# Patient Record
Sex: Female | Born: 1985 | Race: Black or African American | Hispanic: No | Marital: Single | Smoking: Never smoker
Health system: Southern US, Community
[De-identification: ages and names within clinical notes are randomized; demographics above are authoritative.]

## PROBLEM LIST (undated history)

## (undated) ENCOUNTER — Emergency Department (HOSPITAL_COMMUNITY): Admission: EM | Payer: Medicare Other

## (undated) ENCOUNTER — Emergency Department (HOSPITAL_COMMUNITY): Admission: EM | Disposition: A | Discharge status: Other Acute Inpatient Hospital | Payer: Medicare Other

## (undated) DIAGNOSIS — R9431 Abnormal electrocardiogram [ECG] [EKG]: Secondary | ICD-10-CM

## (undated) DIAGNOSIS — E119 Type 2 diabetes mellitus without complications: Secondary | ICD-10-CM

## (undated) DIAGNOSIS — D649 Anemia, unspecified: Secondary | ICD-10-CM

## (undated) DIAGNOSIS — R112 Nausea with vomiting, unspecified: Secondary | ICD-10-CM

## (undated) DIAGNOSIS — N12 Tubulo-interstitial nephritis, not specified as acute or chronic: Secondary | ICD-10-CM

## (undated) DIAGNOSIS — K219 Gastro-esophageal reflux disease without esophagitis: Secondary | ICD-10-CM

## (undated) DIAGNOSIS — K222 Esophageal obstruction: Secondary | ICD-10-CM

## (undated) DIAGNOSIS — F445 Conversion disorder with seizures or convulsions: Secondary | ICD-10-CM

## (undated) DIAGNOSIS — N289 Disorder of kidney and ureter, unspecified: Secondary | ICD-10-CM

## (undated) DIAGNOSIS — I10 Essential (primary) hypertension: Secondary | ICD-10-CM

## (undated) DIAGNOSIS — Q998 Other specified chromosome abnormalities: Secondary | ICD-10-CM

## (undated) DIAGNOSIS — Z5189 Encounter for other specified aftercare: Secondary | ICD-10-CM

## (undated) DIAGNOSIS — N189 Chronic kidney disease, unspecified: Secondary | ICD-10-CM

## (undated) DIAGNOSIS — N390 Urinary tract infection, site not specified: Secondary | ICD-10-CM

## (undated) DIAGNOSIS — R569 Unspecified convulsions: Secondary | ICD-10-CM

## (undated) DIAGNOSIS — W44F3XA Food entering into or through a natural orifice, initial encounter: Secondary | ICD-10-CM

## (undated) DIAGNOSIS — F8189 Other developmental disorders of scholastic skills: Secondary | ICD-10-CM

## (undated) DIAGNOSIS — E785 Hyperlipidemia, unspecified: Secondary | ICD-10-CM

## (undated) DIAGNOSIS — Z940 Kidney transplant status: Secondary | ICD-10-CM

## (undated) DIAGNOSIS — J302 Other seasonal allergic rhinitis: Secondary | ICD-10-CM

## (undated) DIAGNOSIS — T18128A Food in esophagus causing other injury, initial encounter: Secondary | ICD-10-CM

## (undated) HISTORY — PX: KIDNEY TRANSPLANT: SHX239

## (undated) HISTORY — DX: Encounter for other specified aftercare: Z51.89

## (undated) HISTORY — DX: Tubulo-interstitial nephritis, not specified as acute or chronic: N12

## (undated) HISTORY — DX: Other specified chromosome abnormalities: Q99.8

## (undated) HISTORY — DX: Hyperlipidemia, unspecified: E78.5

## (undated) HISTORY — DX: Chronic kidney disease, unspecified: N18.9

## (undated) HISTORY — DX: Food entering into or through a natural orifice, initial encounter: W44.F3XA

## (undated) HISTORY — DX: Essential (primary) hypertension: I10

## (undated) HISTORY — DX: Conversion disorder with seizures or convulsions: F44.5

## (undated) HISTORY — PX: ARTERIOVENOUS GRAFT PLACEMENT: SUR1029

## (undated) HISTORY — PX: PARATHYROIDECTOMY: SHX19

## (undated) HISTORY — DX: Abnormal electrocardiogram (ECG) (EKG): R94.31

## (undated) HISTORY — DX: Food in esophagus causing other injury, initial encounter: T18.128A

## (undated) HISTORY — DX: Food in esophagus causing other injury, initial encounter: K22.2

## (undated) HISTORY — DX: Other developmental disorders of scholastic skills: F81.89

---
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
## 2003-02-15 ENCOUNTER — Emergency Department (HOSPITAL_COMMUNITY): Admission: EM | Admit: 2003-02-15 | Discharge: 2003-02-15 | Payer: Self-pay | Admitting: Emergency Medicine

## 2004-10-24 ENCOUNTER — Ambulatory Visit (HOSPITAL_COMMUNITY): Admission: RE | Admit: 2004-10-24 | Discharge: 2004-10-24 | Payer: Self-pay | Admitting: *Deleted

## 2004-12-21 ENCOUNTER — Ambulatory Visit (HOSPITAL_COMMUNITY): Admission: RE | Admit: 2004-12-21 | Discharge: 2004-12-21 | Payer: Self-pay | Admitting: Vascular Surgery

## 2005-03-15 DIAGNOSIS — Z940 Kidney transplant status: Secondary | ICD-10-CM

## 2005-03-15 HISTORY — PX: KIDNEY TRANSPLANT: SHX239

## 2005-03-15 HISTORY — DX: Kidney transplant status: Z94.0

## 2005-05-11 ENCOUNTER — Ambulatory Visit: Payer: Self-pay | Admitting: Family Medicine

## 2005-05-26 ENCOUNTER — Ambulatory Visit (HOSPITAL_COMMUNITY): Admission: RE | Admit: 2005-05-26 | Discharge: 2005-05-26 | Payer: Self-pay | Admitting: Vascular Surgery

## 2005-12-13 ENCOUNTER — Ambulatory Visit: Payer: Self-pay | Admitting: Family Medicine

## 2006-05-13 ENCOUNTER — Encounter (INDEPENDENT_AMBULATORY_CARE_PROVIDER_SITE_OTHER): Payer: Self-pay | Admitting: *Deleted

## 2006-05-16 ENCOUNTER — Encounter: Admission: RE | Admit: 2006-05-16 | Discharge: 2006-05-16 | Payer: Self-pay | Admitting: Nephrology

## 2006-10-10 ENCOUNTER — Emergency Department (HOSPITAL_COMMUNITY): Admission: EM | Admit: 2006-10-10 | Discharge: 2006-10-10 | Payer: Self-pay | Admitting: Emergency Medicine

## 2006-11-23 ENCOUNTER — Encounter: Admission: RE | Admit: 2006-11-23 | Discharge: 2006-11-23 | Payer: Self-pay | Admitting: Internal Medicine

## 2006-12-12 ENCOUNTER — Ambulatory Visit: Payer: Self-pay | Admitting: Family Medicine

## 2007-02-13 ENCOUNTER — Encounter: Payer: Self-pay | Admitting: *Deleted

## 2007-08-16 ENCOUNTER — Encounter: Payer: Self-pay | Admitting: Family Medicine

## 2007-09-25 ENCOUNTER — Encounter: Payer: Self-pay | Admitting: Family Medicine

## 2007-09-25 ENCOUNTER — Ambulatory Visit: Payer: Self-pay | Admitting: Sports Medicine

## 2007-09-25 DIAGNOSIS — E669 Obesity, unspecified: Secondary | ICD-10-CM | POA: Insufficient documentation

## 2007-09-25 DIAGNOSIS — F8189 Other developmental disorders of scholastic skills: Secondary | ICD-10-CM

## 2007-09-25 DIAGNOSIS — Z940 Kidney transplant status: Secondary | ICD-10-CM | POA: Insufficient documentation

## 2007-09-25 DIAGNOSIS — I10 Essential (primary) hypertension: Secondary | ICD-10-CM | POA: Insufficient documentation

## 2007-09-25 DIAGNOSIS — D631 Anemia in chronic kidney disease: Secondary | ICD-10-CM | POA: Insufficient documentation

## 2007-09-25 DIAGNOSIS — N189 Chronic kidney disease, unspecified: Secondary | ICD-10-CM | POA: Insufficient documentation

## 2007-09-25 HISTORY — DX: Other developmental disorders of scholastic skills: F81.89

## 2007-09-25 LAB — CONVERTED CEMR LAB
Albumin: 4.4 g/dL (ref 3.5–5.2)
BUN: 36 mg/dL — ABNORMAL HIGH (ref 6–23)
CO2: 19 meq/L (ref 19–32)
Calcium: 9.1 mg/dL (ref 8.4–10.5)
Chlamydia, DNA Probe: NEGATIVE
Chloride: 107 meq/L (ref 96–112)
Cholesterol: 143 mg/dL (ref 0–200)
Creatinine, Ser: 1.99 mg/dL — ABNORMAL HIGH (ref 0.40–1.20)
Glucose, Bld: 185 mg/dL — ABNORMAL HIGH (ref 70–99)
HDL: 26 mg/dL — ABNORMAL LOW (ref >39)
Pap Smear: NORMAL
Total CHOL/HDL Ratio: 5.5
Triglycerides: 346 mg/dL — ABNORMAL HIGH (ref <150)

## 2007-10-18 ENCOUNTER — Encounter (HOSPITAL_COMMUNITY): Admission: RE | Admit: 2007-10-18 | Discharge: 2008-01-16 | Payer: Self-pay | Admitting: Nephrology

## 2007-10-26 ENCOUNTER — Inpatient Hospital Stay (HOSPITAL_COMMUNITY): Admission: EM | Admit: 2007-10-26 | Discharge: 2007-10-29 | Payer: Self-pay | Admitting: Emergency Medicine

## 2007-10-26 ENCOUNTER — Ambulatory Visit: Payer: Self-pay | Admitting: Family Medicine

## 2007-10-26 ENCOUNTER — Encounter: Payer: Self-pay | Admitting: Sports Medicine

## 2007-11-01 ENCOUNTER — Ambulatory Visit: Payer: Self-pay | Admitting: Family Medicine

## 2007-11-01 LAB — CONVERTED CEMR LAB: Hgb A1c MFr Bld: 11.3 %

## 2007-11-02 ENCOUNTER — Telehealth: Payer: Self-pay | Admitting: Family Medicine

## 2007-11-03 ENCOUNTER — Encounter: Payer: Self-pay | Admitting: Family Medicine

## 2007-11-15 ENCOUNTER — Encounter: Payer: Self-pay | Admitting: Family Medicine

## 2007-11-22 ENCOUNTER — Ambulatory Visit: Payer: Self-pay | Admitting: Family Medicine

## 2007-11-22 ENCOUNTER — Encounter: Payer: Self-pay | Admitting: Family Medicine

## 2007-11-23 ENCOUNTER — Telehealth: Payer: Self-pay | Admitting: Family Medicine

## 2007-11-30 ENCOUNTER — Encounter: Payer: Self-pay | Admitting: Family Medicine

## 2007-12-04 ENCOUNTER — Ambulatory Visit: Payer: Self-pay | Admitting: Family Medicine

## 2007-12-05 ENCOUNTER — Ambulatory Visit: Payer: Self-pay | Admitting: Family Medicine

## 2007-12-05 DIAGNOSIS — N2581 Secondary hyperparathyroidism of renal origin: Secondary | ICD-10-CM | POA: Insufficient documentation

## 2007-12-07 ENCOUNTER — Encounter: Payer: Self-pay | Admitting: Family Medicine

## 2007-12-08 ENCOUNTER — Encounter: Admission: RE | Admit: 2007-12-08 | Discharge: 2007-12-08 | Payer: Self-pay | Admitting: Internal Medicine

## 2007-12-15 ENCOUNTER — Encounter: Payer: Self-pay | Admitting: Family Medicine

## 2007-12-20 ENCOUNTER — Other Ambulatory Visit: Payer: Self-pay

## 2008-01-09 ENCOUNTER — Ambulatory Visit: Payer: Self-pay | Admitting: Family Medicine

## 2008-01-10 ENCOUNTER — Encounter: Payer: Self-pay | Admitting: Family Medicine

## 2008-01-15 ENCOUNTER — Telehealth: Payer: Self-pay | Admitting: *Deleted

## 2008-01-18 ENCOUNTER — Encounter (HOSPITAL_COMMUNITY): Admission: RE | Admit: 2008-01-18 | Discharge: 2008-03-14 | Payer: Self-pay | Admitting: Nephrology

## 2008-01-24 ENCOUNTER — Ambulatory Visit: Payer: Self-pay | Admitting: Family Medicine

## 2008-01-24 LAB — CONVERTED CEMR LAB: Hgb A1c MFr Bld: 9.6 %

## 2008-02-12 ENCOUNTER — Telehealth: Payer: Self-pay | Admitting: *Deleted

## 2008-02-16 ENCOUNTER — Ambulatory Visit: Payer: Self-pay | Admitting: Family Medicine

## 2008-02-22 ENCOUNTER — Telehealth: Payer: Self-pay | Admitting: *Deleted

## 2008-03-06 ENCOUNTER — Telehealth: Payer: Self-pay | Admitting: *Deleted

## 2008-03-15 ENCOUNTER — Encounter (HOSPITAL_COMMUNITY): Admission: RE | Admit: 2008-03-15 | Discharge: 2008-06-13 | Payer: Self-pay | Admitting: Nephrology

## 2008-03-29 ENCOUNTER — Telehealth: Payer: Self-pay | Admitting: *Deleted

## 2008-04-09 ENCOUNTER — Ambulatory Visit: Payer: Self-pay | Admitting: Family Medicine

## 2008-04-19 ENCOUNTER — Telehealth (INDEPENDENT_AMBULATORY_CARE_PROVIDER_SITE_OTHER): Payer: Self-pay | Admitting: *Deleted

## 2008-04-23 ENCOUNTER — Encounter: Payer: Self-pay | Admitting: Family Medicine

## 2008-04-25 ENCOUNTER — Telehealth (INDEPENDENT_AMBULATORY_CARE_PROVIDER_SITE_OTHER): Payer: Self-pay | Admitting: *Deleted

## 2008-05-02 ENCOUNTER — Ambulatory Visit: Payer: Self-pay | Admitting: Family Medicine

## 2008-05-09 ENCOUNTER — Encounter: Payer: Self-pay | Admitting: Family Medicine

## 2008-05-09 ENCOUNTER — Ambulatory Visit: Payer: Self-pay | Admitting: Family Medicine

## 2008-05-09 LAB — CONVERTED CEMR LAB: Hgb A1c MFr Bld: 7.9 %

## 2008-05-10 ENCOUNTER — Encounter: Payer: Self-pay | Admitting: Family Medicine

## 2008-05-10 LAB — CONVERTED CEMR LAB: Chlamydia, DNA Probe: NEGATIVE

## 2008-05-29 ENCOUNTER — Ambulatory Visit (HOSPITAL_COMMUNITY): Admission: RE | Admit: 2008-05-29 | Discharge: 2008-05-29 | Payer: Self-pay | Admitting: Nephrology

## 2008-06-14 ENCOUNTER — Encounter (HOSPITAL_COMMUNITY): Admission: RE | Admit: 2008-06-14 | Discharge: 2008-09-12 | Payer: Self-pay | Admitting: Nephrology

## 2008-06-20 ENCOUNTER — Encounter: Payer: Self-pay | Admitting: Family Medicine

## 2008-06-21 ENCOUNTER — Ambulatory Visit: Payer: Self-pay | Admitting: Family Medicine

## 2008-06-24 ENCOUNTER — Telehealth: Payer: Self-pay | Admitting: Family Medicine

## 2008-06-26 ENCOUNTER — Telehealth: Payer: Self-pay | Admitting: *Deleted

## 2008-07-18 ENCOUNTER — Telehealth: Payer: Self-pay | Admitting: Family Medicine

## 2008-07-30 ENCOUNTER — Encounter: Payer: Self-pay | Admitting: Family Medicine

## 2008-08-02 ENCOUNTER — Ambulatory Visit: Payer: Self-pay | Admitting: Family Medicine

## 2008-08-02 LAB — CONVERTED CEMR LAB: Hgb A1c MFr Bld: 8.6 %

## 2008-09-24 ENCOUNTER — Encounter (HOSPITAL_COMMUNITY): Admission: RE | Admit: 2008-09-24 | Discharge: 2008-12-23 | Payer: Self-pay | Admitting: Nephrology

## 2008-10-15 ENCOUNTER — Telehealth: Payer: Self-pay | Admitting: Family Medicine

## 2008-10-22 ENCOUNTER — Telehealth: Payer: Self-pay | Admitting: Family Medicine

## 2008-10-24 ENCOUNTER — Encounter: Payer: Self-pay | Admitting: Family Medicine

## 2008-10-24 LAB — CONVERTED CEMR LAB
AG Ratio: 1.6
ALT: 9 units/L
AST: 12 units/L
Albumin: 4.5 g/dL
Alkaline Phosphatase: 60 units/L
CO2, blood: 25 mmol/L
Chloride, Serum: 99 mmol/L
Creatinine,U: 53.1 mg/dL
Ferritin: 857 ng/mL
Globulin: 2.8 g/dL
HCT: 33.2 %
Hemoglobin: 11 g/dL
Iron: 68 ug/dL
Ketones, urine, test strip: NEGATIVE
LDL Cholesterol: 85 mg/dL
MCV: 90 fL
Potassium, serum: 4.5 mmol/L
Protein, ur: 2.6 mg/dL
Saturation Ratios: 27 %
Sodium, serum: 132 mmol/L
Triglyceride fasting, serum: 238 mg/dL
WBC: 9.7 10*3/uL
pH: 5.5

## 2008-10-28 ENCOUNTER — Encounter: Payer: Self-pay | Admitting: Family Medicine

## 2008-11-19 ENCOUNTER — Ambulatory Visit: Payer: Self-pay | Admitting: Family Medicine

## 2008-11-19 ENCOUNTER — Encounter: Payer: Self-pay | Admitting: Family Medicine

## 2008-11-19 DIAGNOSIS — Q998 Other specified chromosome abnormalities: Secondary | ICD-10-CM

## 2008-11-19 DIAGNOSIS — Q970 Karyotype 47, XXX: Secondary | ICD-10-CM | POA: Insufficient documentation

## 2008-11-19 HISTORY — DX: Other specified chromosome abnormalities: Q99.8

## 2008-11-19 LAB — CONVERTED CEMR LAB: Hgb A1c MFr Bld: 9.9 %

## 2008-12-25 ENCOUNTER — Encounter: Payer: Self-pay | Admitting: Family Medicine

## 2009-01-02 ENCOUNTER — Encounter (HOSPITAL_COMMUNITY): Admission: RE | Admit: 2009-01-02 | Discharge: 2009-04-02 | Payer: Self-pay | Admitting: Nephrology

## 2009-01-22 ENCOUNTER — Ambulatory Visit: Payer: Self-pay | Admitting: Family Medicine

## 2009-02-20 ENCOUNTER — Encounter: Payer: Self-pay | Admitting: Family Medicine

## 2009-03-11 ENCOUNTER — Ambulatory Visit: Payer: Self-pay | Admitting: Family Medicine

## 2009-03-11 ENCOUNTER — Encounter: Payer: Self-pay | Admitting: Family Medicine

## 2009-03-11 DIAGNOSIS — E28319 Asymptomatic premature menopause: Secondary | ICD-10-CM | POA: Insufficient documentation

## 2009-03-11 LAB — CONVERTED CEMR LAB: Hgb A1c MFr Bld: 8.2 %

## 2009-03-12 LAB — CONVERTED CEMR LAB: FSH: 110.7 milliintl units/mL

## 2009-03-26 ENCOUNTER — Encounter: Payer: Self-pay | Admitting: Family Medicine

## 2009-04-04 ENCOUNTER — Encounter (HOSPITAL_COMMUNITY): Admission: RE | Admit: 2009-04-04 | Discharge: 2009-07-03 | Payer: Self-pay | Admitting: Nephrology

## 2009-05-30 ENCOUNTER — Ambulatory Visit: Payer: Self-pay | Admitting: Family Medicine

## 2009-05-30 LAB — CONVERTED CEMR LAB: Hgb A1c MFr Bld: 9 %

## 2009-06-03 ENCOUNTER — Encounter: Payer: Self-pay | Admitting: Family Medicine

## 2009-06-04 ENCOUNTER — Ambulatory Visit: Payer: Self-pay | Admitting: Family Medicine

## 2009-06-04 ENCOUNTER — Encounter: Payer: Self-pay | Admitting: Family Medicine

## 2009-06-05 ENCOUNTER — Encounter: Payer: Self-pay | Admitting: Family Medicine

## 2009-06-12 ENCOUNTER — Encounter: Payer: Self-pay | Admitting: Family Medicine

## 2009-06-12 ENCOUNTER — Ambulatory Visit: Payer: Self-pay | Admitting: Family Medicine

## 2009-06-25 ENCOUNTER — Ambulatory Visit: Payer: Self-pay | Admitting: Family Medicine

## 2009-06-25 ENCOUNTER — Telehealth: Payer: Self-pay | Admitting: Family Medicine

## 2009-06-25 ENCOUNTER — Telehealth: Payer: Self-pay | Admitting: Sports Medicine

## 2009-06-26 ENCOUNTER — Telehealth: Payer: Self-pay | Admitting: Family Medicine

## 2009-07-02 ENCOUNTER — Encounter: Payer: Self-pay | Admitting: Family Medicine

## 2009-07-02 LAB — CONVERTED CEMR LAB
Basophils Relative: 0 %
Bilirubin Urine: NEGATIVE
CO2, blood: 25 mmol/L
Calcium: 8.4 mg/dL
Chloride, Serum: 100 mmol/L
Creatinine, Ser: 1.81 mg/dL
Glucose, Bld: 384 mg/dL
Glucose, Urine, Semiquant: NEGATIVE
Lymphocytes Relative: 19 %
MCV: 92 fL
Monocytes Relative: 4 %
RBC / HPF: NONE SEEN
RBC, Urine, dipstick: NEGATIVE
RDW: 13.5 %
Saturation Ratios: 27 %

## 2009-07-08 ENCOUNTER — Encounter: Payer: Self-pay | Admitting: Family Medicine

## 2009-07-16 ENCOUNTER — Encounter: Payer: Self-pay | Admitting: Family Medicine

## 2009-07-16 ENCOUNTER — Encounter (HOSPITAL_COMMUNITY): Admission: RE | Admit: 2009-07-16 | Discharge: 2009-10-14 | Payer: Self-pay | Admitting: Nephrology

## 2009-07-17 ENCOUNTER — Encounter: Payer: Self-pay | Admitting: Family Medicine

## 2009-07-17 LAB — CONVERTED CEMR LAB
Albumin: 4.7 g/dL
Basophils Relative: 0 %
Bilirubin Urine: NEGATIVE
Creatinine, Ser: 1.35 mg/dL
Eosinophil percent: 0.1 %
Hemoglobin: 10.8 g/dL
Ketones, urine, test strip: NEGATIVE
Neutrophils Relative %: 6.2 %
Phosphorus: 2.6 mg/dL
Platelets: 206 10*3/uL
Potassium, serum: 4.5 mmol/L
RBC count: 3.64 10*6/uL
Sodium, serum: 136 mmol/L
TIBC: 238 ug/dL
WBC, blood: 8.7 10*3/uL
pH: 5.5

## 2009-07-23 ENCOUNTER — Encounter: Payer: Self-pay | Admitting: Family Medicine

## 2009-08-01 ENCOUNTER — Ambulatory Visit: Payer: Self-pay | Admitting: Family Medicine

## 2009-08-04 ENCOUNTER — Telehealth: Payer: Self-pay | Admitting: Family Medicine

## 2009-08-26 ENCOUNTER — Encounter: Payer: Self-pay | Admitting: Family Medicine

## 2009-09-02 ENCOUNTER — Ambulatory Visit: Payer: Self-pay | Admitting: Family Medicine

## 2009-09-16 ENCOUNTER — Encounter: Payer: Self-pay | Admitting: Family Medicine

## 2009-10-23 ENCOUNTER — Encounter (HOSPITAL_COMMUNITY)
Admission: RE | Admit: 2009-10-23 | Discharge: 2010-01-21 | Payer: Self-pay | Source: Home / Self Care | Admitting: Nephrology

## 2009-11-12 ENCOUNTER — Ambulatory Visit: Payer: Self-pay | Admitting: Family Medicine

## 2009-11-19 ENCOUNTER — Encounter: Payer: Self-pay | Admitting: Family Medicine

## 2009-12-12 ENCOUNTER — Ambulatory Visit: Payer: Self-pay | Admitting: Family Medicine

## 2009-12-12 ENCOUNTER — Encounter: Payer: Self-pay | Admitting: Family Medicine

## 2009-12-12 LAB — CONVERTED CEMR LAB: Hgb A1c MFr Bld: 10.3 %

## 2010-01-04 ENCOUNTER — Encounter: Payer: Self-pay | Admitting: Family Medicine

## 2010-01-04 DIAGNOSIS — N182 Chronic kidney disease, stage 2 (mild): Secondary | ICD-10-CM | POA: Insufficient documentation

## 2010-01-04 DIAGNOSIS — N189 Chronic kidney disease, unspecified: Secondary | ICD-10-CM | POA: Insufficient documentation

## 2010-01-19 ENCOUNTER — Encounter: Payer: Self-pay | Admitting: Family Medicine

## 2010-01-26 ENCOUNTER — Telehealth: Payer: Self-pay | Admitting: *Deleted

## 2010-01-26 ENCOUNTER — Ambulatory Visit: Payer: Self-pay | Admitting: Family Medicine

## 2010-01-26 LAB — CONVERTED CEMR LAB
Blood in Urine, dipstick: NEGATIVE
Glucose, Urine, Semiquant: 100
Specific Gravity, Urine: >=1.03
WBC Urine, dipstick: NEGATIVE
pH: 5.5

## 2010-01-28 ENCOUNTER — Telehealth: Payer: Self-pay | Admitting: Family Medicine

## 2010-01-29 ENCOUNTER — Ambulatory Visit: Payer: Self-pay | Admitting: Family Medicine

## 2010-01-29 ENCOUNTER — Encounter: Payer: Self-pay | Admitting: Family Medicine

## 2010-01-29 LAB — CONVERTED CEMR LAB
Calcium: 8.6 mg/dL (ref 8.4–10.5)
Creatinine, Ser: 1.51 mg/dL — ABNORMAL HIGH (ref 0.40–1.20)
Hemoglobin: 10.3 g/dL — ABNORMAL LOW (ref 12.0–15.0)
RBC: 3.56 M/uL — ABNORMAL LOW (ref 3.87–5.11)
WBC: 8.5 10*3/uL (ref 4.0–10.5)

## 2010-01-30 ENCOUNTER — Encounter (HOSPITAL_COMMUNITY)
Admission: RE | Admit: 2010-01-30 | Discharge: 2010-04-14 | Payer: Self-pay | Source: Home / Self Care | Attending: Nephrology | Admitting: Nephrology

## 2010-02-02 ENCOUNTER — Telehealth (INDEPENDENT_AMBULATORY_CARE_PROVIDER_SITE_OTHER): Payer: Self-pay | Admitting: *Deleted

## 2010-02-11 ENCOUNTER — Ambulatory Visit: Payer: Self-pay | Admitting: Family Medicine

## 2010-02-24 ENCOUNTER — Ambulatory Visit: Payer: Self-pay | Admitting: Family Medicine

## 2010-03-10 ENCOUNTER — Encounter: Payer: Self-pay | Admitting: Family Medicine

## 2010-03-17 ENCOUNTER — Encounter: Payer: Self-pay | Admitting: *Deleted

## 2010-03-30 LAB — POCT HEMOGLOBIN-HEMACUE: Hemoglobin: 10.7 g/dL — ABNORMAL LOW (ref 12.0–15.0)

## 2010-03-31 ENCOUNTER — Ambulatory Visit: Admit: 2010-03-31 | Payer: Self-pay

## 2010-04-03 ENCOUNTER — Ambulatory Visit: Admission: RE | Admit: 2010-04-03 | Discharge: 2010-04-03 | Payer: Self-pay | Source: Home / Self Care

## 2010-04-03 ENCOUNTER — Encounter: Payer: Self-pay | Admitting: Family Medicine

## 2010-04-03 LAB — CONVERTED CEMR LAB
Calcium: 10 mg/dL (ref 8.4–10.5)
Chloride: 100 meq/L (ref 96–112)
Creatinine, Ser: 1.56 mg/dL — ABNORMAL HIGH (ref 0.40–1.20)

## 2010-04-06 ENCOUNTER — Encounter: Payer: Self-pay | Admitting: Internal Medicine

## 2010-04-06 ENCOUNTER — Telehealth: Payer: Self-pay | Admitting: Family Medicine

## 2010-04-08 ENCOUNTER — Ambulatory Visit: Admission: RE | Admit: 2010-04-08 | Discharge: 2010-04-08 | Payer: Self-pay | Source: Home / Self Care

## 2010-04-08 ENCOUNTER — Encounter: Payer: Self-pay | Admitting: Family Medicine

## 2010-04-09 ENCOUNTER — Telehealth: Payer: Self-pay | Admitting: *Deleted

## 2010-04-14 NOTE — Assessment & Plan Note (Signed)
Summary: pink eye per pt/[place]   Vital Signs:  Patient profile:   25 year old female Height:      66.25 inches Weight:      209 pounds BMI:     33.60 BSA:     2.05 Temp:     98.6 degrees F Pulse rate:   88 / minute BP sitting:   153 / 106  Vitals Entered By: Christen Bame CMA (June 25, 2009 11:47 AM) CC: Pink eye? Is Patient Diabetic? Yes Did you bring your meter with you today? No Pain Assessment Patient in pain? no       Vision Screening:Left eye w/o correction: 20 / 25 Right Eye w/o correction: 20 / 25 Both eyes w/o correction:  20/ 20        Vision Entered By: Levert Feinstein LPN (April 13, 624THL 624THL PM)   Primary Care Provider:  Patria Mane  MD  CC:  Pink eye?Marland Kitchen  History of Present Illness: ?pink eye: evening prior to visit woke up from nap with painful, red left eye. looked on the internet and tried some various "home remedies" but without relief so called to be seen today. she has noticed that the eye is tearing a great deal, is somewhat sensitive to light, is red and tender to touch.  she has been using [employer] brand eye drops without much relief.  no fevers.  no one else at home has similar symptoms.  she does feel like there is a foreign body in her eye but she doesn't recall having one get into her eye.   Habits & Providers  Alcohol-Tobacco-Diet     Tobacco Status: never  Current Medications (verified): 1)  Myfortic 360 Mg  Tbec (Mycophenolate Sodium) .... 3 Tablets Two Times A Day 2)  Prograf 1 Mg Caps (Tacrolimus) .... 4 Caps By Mouth Two Times A Day 3)  Tums 500 Mg  Chew (Calcium Carbonate Antacid) .... Prn 4)  Procrit 10000 Unit/ml  Soln (Epoetin Alfa) .... 30,000 Units Q Month 5)  Glucotrol Xl 10 Mg Xr24h-Tab (Glipizide) .Marland Kitchen.. 1 Tab By Mouth Bid 6)  Lantus 100 Unit/ml Soln (Insulin Glargine) .... 50 Units Subcutaneously At Bedtime. Suppply Quant Sufficient For 1 Month.  Disp With Needles, Syringes 7)  Ascensia Breeze 2  Disk (Glucose Blood) .... Test  Blood Sugar Four Times Daily. 8)  Bactrim 400-80 Mg Tabs (Sulfamethoxazole-Trimethoprim) .Marland Kitchen.. 1 Tab Daily Per Kidney Doctor. 9)  Prednisone 5 Mg Tabs (Prednisone) .... Unknown Dosage Per Kidney Transplant Doctors 10)  Hydroxyzine Hcl 25 Mg Tabs (Hydroxyzine Hcl) .Marland Kitchen.. 1 By Mouth Two Times A Day As Needed Itching 11)  Ranitidine Hcl 150 Mg Tabs (Ranitidine Hcl) .Marland Kitchen.. 1 By Mouth Once Daily As Needed 12)  Clonidine Hcl 0.2 Mg Tabs (Clonidine Hcl) .Marland Kitchen.. 1 By Mouth Q6hr As Needed Sbp >165 Per [facility] 13)  B-D Ultra-Fine 30gx0.28ml .... Use As Directed 14)  Promethazine Hcl 25 Mg Tabs (Promethazine Hcl) .... 1/2 - 1 Tab By Mouth Three Times A Day As Needed Nausea 15)  Erythromycin 5 Mg/gm Oint (Erythromycin) .... Apply To Lower Eye Lid Three Times A Day For 7 Days For Pink Eye.  Disp 1 Tube  Allergies (verified): No Known Drug Allergies  Past History:  Past medical, surgical, family and social histories (including risk factors) reviewed for relevance to current acute and chronic problems.  Past Medical History: Reviewed history from 11/01/2007 and no changes required. 48, XXX, absent uterus, right ovary, small left ovary, FSGS,  h/o enuresis, s/p deceased donor kidney transplant in 4/07 from a donor with an underlying malignancy- followed in [place] procrit Glucose intolerance s/p transplant, now diabetic in 10/2007  Past Surgical History: Reviewed history from 05/12/2006 and no changes required. CT - Abdominal 10/00 -, CT - Pelvic 10/00 -, Karyotype 9/00 -, Multiple AV grafts (most recent 12/06) - 05/11/2005, Parathyroidectomy - 10/13/2004, Peritoneal abscss surgery -  Family History: Reviewed history from 01/22/2009 and no changes required. mother with HTN  Social History: Reviewed history from 09/25/2007 and no changes required. Lives with mother, attends [employer] (early childhood dev). ; No tobacco, etoh or drug use. Sexually active.  Review of Systems       per HPI.  some  associated runny nose and ear pain.  no abdominal pain.    Physical Exam  General:  obese, NAD, pleasant  VS and visual acuity noted Head:  NCAT Eyes:  under flurocesin stain no corneal abrasion noted in left eye. L eye conjunctiva very erythematous.  significant tearing of left eyevision grossly intact, pupils equal, pupils round, pupils reactive to light, pupils react to accomodation, and no optic disk abnormalities.   Nose:  mild clear rhinorrhea   Impression & Recommendations:  Problem # 1:  CONJUNCTIVITIS, ACUTE, LEFT (ICD-372.00) Assessment New  trial of emycin ointment - likely viral conjunctivitis but ointment may be soothing to eye discomfort.  call for any worsening symptoms.  examined with dr Andria Frames. Her updated medication list for this problem includes:    Erythromycin 5 Mg/gm Oint (Erythromycin) .Marland Kitchen... Apply to lower eye lid three times a day for 7 days for pink eye.  disp 1 tube  Orders: [place]- Est Level  3 ([address]213)  Complete Medication List: 1)  Myfortic 360 Mg Tbec (Mycophenolate sodium) .... 3 tablets two times a day 2)  Prograf 1 Mg Caps (Tacrolimus) .... 4 caps by mouth two times a day 3)  Tums 500 Mg Chew (Calcium carbonate antacid) .... Prn 4)  Procrit 10000 Unit/ml Soln (Epoetin alfa) .... 30,000 units q month 5)  Glucotrol Xl 10 Mg Xr24h-tab (Glipizide) .Marland Kitchen.. 1 tab by mouth bid 6)  Lantus 100 Unit/ml Soln (Insulin glargine) .... 50 units subcutaneously at bedtime. suppply quant sufficient for 1 month.  disp with needles, syringes 7)  Ascensia Breeze 2 Disk (Glucose blood) .... Test blood sugar four times daily. 8)  Bactrim 400-80 Mg Tabs (Sulfamethoxazole-trimethoprim) .Marland Kitchen.. 1 tab daily per kidney doctor. 9)  Prednisone 5 Mg Tabs (Prednisone) .... Unknown dosage per kidney transplant doctors 10)  Hydroxyzine Hcl 25 Mg Tabs (Hydroxyzine hcl) .Marland Kitchen.. 1 by mouth two times a day as needed itching 11)  Ranitidine Hcl 150 Mg Tabs (Ranitidine hcl) .Marland Kitchen.. 1 by mouth once daily  as needed 12)  Clonidine Hcl 0.2 Mg Tabs (Clonidine hcl) .Marland Kitchen.. 1 by mouth q6hr as needed sbp >165 per [facility] 13)  B-d Ultra-fine 30gx0.52ml  .... Use as directed 14)  Promethazine Hcl 25 Mg Tabs (Promethazine hcl) .... 1/2 - 1 tab by mouth three times a day as needed nausea 15)  Erythromycin 5 Mg/gm Oint (Erythromycin) .... Apply to lower eye lid three times a day for 7 days for pink eye.  disp 1 tube  Patient Instructions: 1)  Use the ointment - pull your lower eyelid down and put some across the eyelid then blink.  you will have blurry vision for a few minutes but then the eye will feel moisturized.  do this three times daily for the  next 7 days for your pink eye. 2)  Call me if things get worse and not better. Prescriptions: ERYTHROMYCIN 5 MG/GM OINT (ERYTHROMYCIN) apply to lower eye lid three times a day for 7 days for pink eye.  Disp 1 tube  #1 x 0   Entered and Authorized by:   Patria Mane  MD   Signed by:   Patria Mane  MD on 06/25/2009   Method used:   Electronically to        [facility]. [address] [phone]* (retail)       [address]       [place], [place]  [address]       Ph: [phone] or [phone]       Fax: [phone]   RxID:   [id number]

## 2010-04-14 NOTE — Miscellaneous (Signed)
  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  GFR= 76

## 2010-04-14 NOTE — Progress Notes (Signed)
Summary: pt has diarrhea would like to be seen  Phone Note Call from Patient Call back at [phone]   Caller: Patient Summary of Call: pt states  that she thinks that she has the flu, she is c/o diarrhea mostly. suggested  that she try immodium but she had a kidney transplant and was told not to try that.  wants to be seen Initial call taken by: Audelia Hives CMA,  January 26, 2010 8:52 AM  Follow-up for Phone Call        Diarrhea x 4 days.  No abd cramping or pain, no fever.  Tod her to come in this am and we would work her in. Follow-up by: Elray Mcgregor RN,  January 26, 2010 8:57 AM

## 2010-04-14 NOTE — Consult Note (Signed)
Summary: [facility]   Imported By: Audie Clear 08/05/2009 15:29:18  _____________________________________________________________________  External Attachment:    Type:   Image     Comment:   External Document

## 2010-04-14 NOTE — Assessment & Plan Note (Signed)
Summary: DM/eo   Vital Signs:  Patient profile:   25 year old female Height:      65.25 inches Weight:      195.19 pounds BMI:     32.35 BSA:     1.96 Temp:     98.4 degrees F Pulse rate:   77 / minute BP sitting:   117 / 78  Vitals Entered By: Christen Bame CMA (February 11, 2010 4:10 PM) CC: f/u DM Is Patient Diabetic? Yes Did you bring your meter with you today? No Pain Assessment Patient in pain? no        Primary Provider:  Cletus Gash MD  CC:  f/u DM.  History of Present Illness: [name] is feeling much better now- her diarrhea is resolved and she is wondering what the lab studies showed.  DM- pt says her blood sugars have been high.  200 in am and 300-400 in pm.  She still believes that the diabetes is due to being on prednisone for kidney anti-rejection.  She says it shoots up when she takes the prednisone, but it also shoots up when she eats a snack.  Denies CP, Shortness of breath. She needs her BD needles refilled as they gave her the long ones, not the short ones last time and they hurt more.   Obesity- Pt. continues to gain weight.  She says she has been exercising- walking on the treadmill about 30 min/day. She seems unwilling to make changes in her diet.   HTN- better controlled since taking lisinopril.  Pt. not complaining of any side effects.   Habits & Providers  Alcohol-Tobacco-Diet     Tobacco Status: never  Allergies: No Known Drug Allergies  Physical Exam  General:  Well-developed,well-nourished,in no acute distress; alert,appropriate and cooperative throughout examination Eyes:  No corneal or conjunctival inflammation noted. EOMI. Perrla. Funduscopic exam benign, without hemorrhages, exudates or papilledema. Vision grossly normal. Mouth:  Oral mucosa and oropharynx without lesions or exudates.  Teeth in good repair. Neck:  No deformities, masses, or tenderness noted. Lungs:  Normal respiratory effort, chest expands symmetrically.  Lungs are clear to auscultation, no crackles or wheezes. Heart:  Normal rate and regular rhythm. S1 and S2 normal without gallop, murmur, click, rub or other extra sounds. Abdomen:  Bowel sounds positive,abdomen soft and non-tender without masses, organomegaly or hernias noted. Pulses:  R and L carotid,radial,dorsalis pedis and posterior tibial pulses are full and equal bilaterally   Impression & Recommendations:  Problem # 1:  DM (ICD-250.00)  Poorly controlled.  Pt has difficulty understanding why it is important to control her blood sugar.  She blames her blood sugars on being on prednisone and therefore thinks it is not her fault.  It seems that since she does not feel it is her fault, it is not important for them to be low.  I reinforced that even though prednisone makes her blood sugars higher, it is still important to get them lower.  Reminded her of some of the complications of DM, including blindness, MI, CVA, and limb loss.  I am referring her to Pharmacy clinic for further management of her DM as her learning disability makes it difficult to find an appropriate regimen for her.  Her updated medication list for this problem includes:    Glucotrol Xl 10 Mg Xr24h-tab (Glipizide) .Marland Kitchen... 1 tab by mouth bid    Lantus 100 Unit/ml Soln (Insulin glargine) .Marland KitchenMarland KitchenMarland KitchenMarland Kitchen 60 units subcutaneously at bedtime. suppply quant sufficient for 1 month.  disp  with needles, syringes    Novolog 100 Unit/ml Soln (Insulin aspart) .Marland Kitchen... Take 5 units at bed time. disp 2 vials. disp with insulin syringes and needles for once daily dosing    Lisinopril 10 Mg Tabs (Lisinopril) .Marland Kitchen... Take one by mouth daily  Orders: [place]- Est  Level 4 [id number])  Problem # 2:  ESSENTIAL HYPERTENSION, BENIGN (ICD-401.1)  Much improved.  Will continue current medications.  Her updated medication list for this problem includes:    Clonidine Hcl 0.2 Mg Tabs (Clonidine hcl) .Marland Kitchen... 1 by mouth q6hr as needed sbp >165 per [facility]     Lisinopril 10 Mg Tabs (Lisinopril) .Marland Kitchen... Take one by mouth daily  Orders: [place]- Est  Level 4 [id number])  Problem # 3:  OBESITY (ICD-278.00)  Pt. has gained weight again, despite exercising.  Pt. not willing or has poor understanding about making diet changes. Will continue to reinforce health eating.   Orders: [place]- Est  Level 4 [id number])  Problem # 4:  LEARNING DISABILITY (ICD-315.2)  Pt's learning disablity continues to be a barrier to her optimizing her health.  Will continue to encourage her and have her mom help as well.   Orders: [place]- Est  Level 4 ([id number])  Complete Medication List: 1)  Myfortic 360 Mg Tbec (Mycophenolate sodium) .... 3 tablets two times a day 2)  Prograf 1 Mg Caps (Tacrolimus) .... 4 caps by mouth two times a day 3)  Tums 500 Mg Chew (Calcium carbonate antacid) .... Prn 4)  Procrit 10000 Unit/ml Soln (Epoetin alfa) .... 30,000 units q month 5)  Glucotrol Xl 10 Mg Xr24h-tab (Glipizide) .Marland Kitchen.. 1 tab by mouth bid 6)  Lantus 100 Unit/ml Soln (Insulin glargine) .... 60 units subcutaneously at bedtime. suppply quant sufficient for 1 month.  disp with needles, syringes 7)  Ascensia Breeze 2 Disk (Glucose blood) .... Test blood sugar four times daily. 8)  Bactrim 400-80 Mg Tabs (Sulfamethoxazole-trimethoprim) .Marland Kitchen.. 1 tab daily per kidney doctor. 9)  Prednisone 5 Mg Tabs (Prednisone) .... Unknown dosage per kidney transplant doctors 10)  Hydroxyzine Hcl 25 Mg Tabs (Hydroxyzine hcl) .Marland Kitchen.. 1 by mouth two times a day as needed itching 11)  Ranitidine Hcl 150 Mg Tabs (Ranitidine hcl) .Marland Kitchen.. 1 by mouth once daily as needed 12)  Clonidine Hcl 0.2 Mg Tabs (Clonidine hcl) .Marland Kitchen.. 1 by mouth q6hr as needed sbp >165 per [facility] 13)  B-d Ultra-fine 30gx0.67ml  .... Use as directed please give short needles, and b-d name brand. 14)  Novolog 100 Unit/ml Soln (Insulin aspart) .... Take 5 units at bed time. disp 2 vials. disp with insulin syringes and needles for once daily dosing 15)   Simvastatin 20 Mg Tabs (Simvastatin) .... One by mouth daily 16)  Lisinopril 10 Mg Tabs (Lisinopril) .... Take one by mouth daily 17)  Loperamide Hcl 2 Mg Caps (Loperamide hcl) .... 2 capsule by mouth x 1, then 1 capsule by mouth after each loose stools.  max 8 capsules per day. 18)  Ultimate Probiotic Formula Caps (Lactobacillus) .... 2 tabs by mouth twice a day  Patient Instructions: 1)  It was good to see you today.  I am glad you are feeling better.  All your labs from last visit were normal. 2)  Please make an appointment on your way out to see Dr. Valentina Lucks in Pharmacy clinic for your diabetes.  Please try to bring your mom to that appointment. 3)  Try to keep exercising and watching what you eat.  Come  back and see me in a few months and we will check your hemoglobin A1C again.  Prescriptions: B-D ULTRA-FINE 30GX0.5ML use as directed Please give short needles, and B-D name brand.  #100 x 3   Entered and Authorized by:   Cletus Gash MD   Signed by:   Cletus Gash MD on 02/11/2010   Method used:   Reprint   RxIDIN:[id number] B-D ULTRA-FINE 30GX0.5ML use as directed Please give short needles, and B-D name brand.  #100 x 3   Entered and Authorized by:   Cletus Gash MD   Signed by:   Cletus Gash MD on 02/11/2010   Method used:   Print then Give to Patient   RxID:   [id number] RANITIDINE HCL 150 MG TABS (RANITIDINE HCL) 1 by mouth once daily as needed  #30 x 3   Entered and Authorized by:   Cletus Gash MD   Signed by:   Cletus Gash MD on 02/11/2010   Method used:   Electronically to        [facility] (retail)       [place]       [place], [place]  [address]       Ph: [phone] or [phone]       Fax: [phone]   RxID:   [id number] RANITIDINE HCL 150 MG TABS (RANITIDINE HCL) 1 by mouth once daily as needed  #30 x 3   Entered and Authorized by:   Cletus Gash  MD   Signed by:   Cletus Gash MD on 02/11/2010   Method used:   Electronically to        [facility] (retail)       [address]       [place], [place]  [address]       Ph: [phone] or [phone]       Fax: [phone]   RxID:   [id number]  0  Orders Added: 1)  [facility]- Est  Level 4 [id number]

## 2010-04-14 NOTE — Consult Note (Signed)
Summary: [facility]   Imported By: Raymond Gurney 12/01/2009 15:54:42  _____________________________________________________________________  External Attachment:    Type:   Image     Comment:   External Document

## 2010-04-14 NOTE — Assessment & Plan Note (Signed)
Summary: F/U/KH   Vital Signs:  Patient profile:   25 year old female Height:      65.25 inches Weight:      201.4 pounds BMI:     33.38 Temp:     99.2 degrees F oral Pulse rate:   98 / minute BP sitting:   122 / 78  (left arm) Cuff size:   regular  Vitals Entered By: Levert Feinstein LPN (September 30, 624THL 3:27 PM) CC: f/u dm Is Patient Diabetic? Yes Did you bring your meter with you today? No Pain Assessment Patient in pain? no       Does patient need assistance? Functional Status [name]/clean, Social activities Ambulation Normal Comments Pt. needs mother's assistance with taking medications, cooking, and cleaning.  She struggles with depression and feels isolated due to being ill often.    Primary Esaw Knippel:  Cletus Gash MD  CC:  f/u dm.  History of Present Illness: [name] returns today with her mother for follow up for Diabetes, as well as to discuss her disability paperwork.    She has been doing a great job recording her blood sugars twice a day, as well as her blood pressures.  Her blood sugars have ranged from 100-350's, and her blood pressure from 118/75-144/90.  She says she has been trying to exercise and loose weight, as well as watch what she eats.  She says that her blood sugars are not able to be controlled because of the steroid she is on for her kidneys.    Allani needs her disability paperwork filled out.  She is on permanant disability due to her learning disability and chronic medical illnesses.  I interviewd [name] and her mother on her functional status in order to complete the disability paperwork.    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 3 Tablets Two Times A Day 2)  Prograf 1 Mg Caps (Tacrolimus) .... 4 Caps By Mouth Two Times A Day 3)  Tums 500 Mg  Chew (Calcium Carbonate Antacid) .... Prn 4)  Procrit 10000 Unit/ml  Soln (Epoetin Alfa) .... 30,000 Units Q Month 5)  Glucotrol Xl 10 Mg Xr24h-Tab (Glipizide) .Marland Kitchen.. 1 Tab By Mouth Bid 6)  Lantus 100 Unit/ml Soln (Insulin Glargine) .... 50 Units Subcutaneously At Bedtime. Suppply Quant Sufficient For 1 Month.  Disp With Needles, Syringes 7)  Ascensia Breeze 2  Disk (Glucose Blood) .... Test Blood Sugar Four Times Daily. 8)  Bactrim 400-80 Mg Tabs (Sulfamethoxazole-Trimethoprim) .Marland Kitchen.. 1 Tab Daily Per Kidney Doctor. 9)  Prednisone 5 Mg Tabs (Prednisone) .... Unknown Dosage Per Kidney Transplant Doctors 10)  Hydroxyzine Hcl 25 Mg Tabs (Hydroxyzine Hcl) .Marland Kitchen.. 1 By Mouth Two Times A Day As Needed Itching 11)  Ranitidine Hcl 150 Mg Tabs (Ranitidine Hcl) .Marland Kitchen.. 1 By Mouth Once Daily As Needed 12)  Clonidine Hcl 0.2 Mg Tabs (Clonidine Hcl) .Marland Kitchen.. 1 By Mouth Q6hr As Needed Sbp >165 Per [facility] 13)  B-D Ultra-Fine 30gx0.66ml .... Use As Directed 14)  Novolog  100 Unit/ml Soln (Insulin Aspart) .... Take 5 Units At [facility] Meal of The Day. Disp 2 Vials. Disp With Insulin Syringes and Needles For Once Daily Dosing 15)  Simvastatin 20 Mg Tabs (Simvastatin) .... One By Mouth Daily 16)  Lisinopril 10 Mg Tabs (Lisinopril) .... Take One By Mouth Daily  Current Medications (verified): 1)  Myfortic 360 Mg  Tbec (Mycophenolate Sodium) .... 3 Tablets Two Times A Day 2)  Prograf 1 Mg Caps (Tacrolimus) .... 4 Caps By Mouth Two Times A Day 3)  Tums 500 Mg  Chew (Calcium Carbonate Antacid) .... Prn 4)  Procrit 10000 Unit/ml  Soln (Epoetin Alfa) .... 30,000 Units Q Month 5)  Glucotrol Xl  10 Mg Xr24h-Tab (Glipizide) .Marland Kitchen.. 1 Tab By Mouth Bid 6)  Lantus 100 Unit/ml Soln (Insulin Glargine) .... 60 Units Subcutaneously At Bedtime. Suppply Quant Sufficient For 1 Month.  Disp With Needles, Syringes 7)  Ascensia Breeze 2  Disk (Glucose Blood) .... Test Blood Sugar Four Times Daily. 8)  Bactrim 400-80 Mg Tabs (Sulfamethoxazole-Trimethoprim) .Marland Kitchen.. 1 Tab Daily Per Kidney Doctor. 9)  Prednisone 5 Mg Tabs (Prednisone) .... Unknown Dosage Per Kidney Transplant Doctors 10)  Hydroxyzine Hcl 25 Mg Tabs (Hydroxyzine Hcl) .Marland Kitchen.. 1 By Mouth Two Times A Day As Needed Itching 11)  Ranitidine Hcl 150 Mg Tabs (Ranitidine Hcl) .Marland Kitchen.. 1 By Mouth Once Daily As Needed 12)  Clonidine Hcl 0.2 Mg Tabs (Clonidine Hcl) .Marland Kitchen.. 1 By Mouth Q6hr As Needed Sbp >165 Per [facility] 13)  B-D Ultra-Fine 30gx0.58ml .... Use As Directed 14)  Novolog 100 Unit/ml Soln (Insulin Aspart) .... Take 5 Units At [facility] Meal of The Day. Disp 2 Vials. Disp With Insulin Syringes and Needles For Once Daily Dosing 15)  Simvastatin 20 Mg Tabs (Simvastatin) .... One By Mouth Daily 16)  Lisinopril 10 Mg Tabs (Lisinopril) .... Take One By Mouth Daily  Allergies (verified): No Known Drug Allergies  Review of Systems       The patient complains of depression.  The patient denies anorexia, fever, chest pain, syncope, dyspnea on exertion, severe indigestion/heartburn, difficulty walking, and unusual weight change.    Physical Exam  General:  Well-developed,well-nourished,in no acute distress; alert,appropriate and cooperative throughout examination Eyes:  No corneal or conjunctival inflammation noted. EOMI. Perrla. Funduscopic exam benign, without hemorrhages, exudates or papilledema. Vision grossly normal. Nose:  External nasal examination shows no deformity or inflammation. Nasal mucosa are pink and moist without lesions or exudates. Mouth:  Oral mucosa and oropharynx without lesions or exudates.  Teeth in good repair. Neck:  No  deformities, masses, or tenderness noted. Lungs:  Normal respiratory effort, chest expands symmetrically. Lungs are clear to auscultation, no crackles or wheezes. Heart:  Normal rate and regular rhythm. S1 and S2 normal without gallop, murmur, click, rub or other extra sounds. Abdomen:  Bowel sounds positive,abdomen soft and non-tender without masses, organomegaly or hernias noted. Pulses:  R radial normal, R posterior tibial normal, R dorsalis pedis normal, L radial normal, L posterior tibial normal, and L dorsalis pedis normal.     Impression & Recommendations:  Problem # 1:  DM (ICD-250.00) Poorly controlled, Hg A1c 10.3.  I told pt. that being on steroids does not make it impossible to control her blood sugars, but it is more difficult and may require more insulin.  I increased her lantus dose and will have her follow up to see if there is improvement.  She seems to have poor understanding of the consequences of uncontrolled diabetes,  especially in the setting of a kidney transplant.  She seems to have much difficulty with diet control, avoiding sweets.  Her updated medication list for this problem includes:    Glucotrol Xl 10 Mg Xr24h-tab (Glipizide) .Marland Kitchen... 1 tab by mouth bid    Lantus 100 Unit/ml Soln (Insulin glargine) .Marland KitchenMarland KitchenMarland KitchenMarland Kitchen 60 units subcutaneously at bedtime. suppply quant sufficient for 1 month.  disp with needles, syringes    Novolog 100 Unit/ml Soln (Insulin aspart) .Marland Kitchen... Take 5 units at largest meal of the day. disp 2 vials. disp with insulin syringes and needles for once daily dosing    Lisinopril 10 Mg Tabs (Lisinopril) .Marland Kitchen... Take one by mouth daily  Orders: [facility] [id number]) [place]- Est  Level 4 [id number])  Problem # 2:  LEARNING DISABILITY (ICD-315.2) I filled out paperwork for her disability, asking her mother and her questions in order to complete them.  Unfortunately, I feel that [place]'s learning disability or cognitive impairment complicates her ability to understand her medical  conditions and take care of herself.  Orders: [place]- Est  Level 4 [id number])  Problem # 3:  ESSENTIAL HYPERTENSION, BENIGN (ICD-401.1) Blood pressure is much improved, will continue current regimen.  I asked Glada to continue checking her BP once a day.  Her updated medication list for this problem includes:    Clonidine Hcl 0.2 Mg Tabs (Clonidine hcl) .Marland Kitchen... 1 by mouth q6hr as needed sbp >165 per [facility]    Lisinopril 10 Mg Tabs (Lisinopril) .Marland Kitchen... Take one by mouth daily  Orders: [place]- Est  Level 4 [id number])  Problem # 4:  OBESITY (ICD-278.00) Discussed exercise and diet with pt. again.  She has lost a few pounds, but seems unable or unwilling to make large lifestyle changes at this time.  Orders: [place]- Est  Level 4 ([id number])  Complete Medication List: 1)  Myfortic 360 Mg Tbec (Mycophenolate sodium) .... 3 tablets two times a day 2)  Prograf 1 Mg Caps (Tacrolimus) .... 4 caps by mouth two times a day 3)  Tums 500 Mg Chew (Calcium carbonate antacid) .... Prn 4)  Procrit 10000 Unit/ml Soln (Epoetin alfa) .... 30,000 units q month 5)  Glucotrol Xl 10 Mg Xr24h-tab (Glipizide) .Marland Kitchen.. 1 tab by mouth bid 6)  Lantus 100 Unit/ml Soln (Insulin glargine) .... 60 units subcutaneously at bedtime. suppply quant sufficient for 1 month.  disp with needles, syringes 7)  Ascensia Breeze 2 Disk (Glucose blood) .... Test blood sugar four times daily. 8)  Bactrim 400-80 Mg Tabs (Sulfamethoxazole-trimethoprim) .Marland Kitchen.. 1 tab daily per kidney doctor. 9)  Prednisone 5 Mg Tabs (Prednisone) .... Unknown dosage per kidney transplant doctors 10)  Hydroxyzine Hcl 25 Mg Tabs (Hydroxyzine hcl) .Marland Kitchen.. 1 by mouth two times a day as needed itching 11)  Ranitidine Hcl 150 Mg Tabs (Ranitidine hcl) .Marland Kitchen.. 1 by mouth once daily as needed 12)  Clonidine Hcl 0.2 Mg Tabs (Clonidine hcl) .Marland Kitchen.. 1 by mouth q6hr as needed sbp >165 per [facility] 13)  B-d Ultra-fine 30gx0.48ml  .... Use as directed 14)  Novolog 100 Unit/ml Soln (Insulin  aspart) .... Take 5 units at largest meal of the day. disp 2 vials. disp with insulin syringes and needles for once daily dosing 15)  Simvastatin 20 Mg Tabs (Simvastatin) .... One by mouth daily 16)  Lisinopril 10 Mg Tabs (Lisinopril) .... Take one by mouth daily  Patient Instructions: 1)  It was good to see you today.  Your blood pressure is much better, keep taking the lisinopril.  2)  I am going to increase your Lantus to 60 units subcutaneously at bedtime.  Please take this every day. 3)  Keep writing down your blood sugars once or twice a day and taking your blood pressure.   4)  Make an appt. to see me again in 3 months. 5)  I will fill out your disability paperwork and leave it at the front desk.   Laboratory Results   Blood Tests   Date/Time Received: December 12, 2009 3:15 PM  Date/Time Reported: December 12, 2009 3:34 PM   HGBA1C: 10.3%   (Normal Range: Non-Diabetic - 3-6%   Control Diabetic - 6-8%)  Comments: ...............test performed by.................Marland KitchenLevert Feinstein, LPN .............entered by...........Marland KitchenBonnie A. Martinique, MLS (ASCP)cm

## 2010-04-14 NOTE — Miscellaneous (Signed)
Summary: patient summary  Clinical Lists Changes  Problems: Removed problem of CONJUNCTIVITIS, ACUTE, LEFT (ICD-372.00) Removed problem of NAUSEA AND VOMITING (ICD-787.01) Removed problem of SCAR CONDITION AND FIBROSIS OF SKIN (ICD-709.2) Removed problem of * V04.81 Observations: Added new observation of SOCIAL HX: Lives with mother, attends [employer] (early childhood dev). ; No tobacco, etoh or drug use. Sexually active.  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- followed in [place] and by [place] (Dr Lorrene Reid) - on chronic immunosuppressants including prednisone Anemia-on procrit secondary hyperparathyroidism Glucose intolerance s/p transplant at least in part due to prednisone therapay, now diabetic in 10/2007 - insulin dependent, difficult to control due to learning disability HTN - labile - primarily managed by renal doctors learning diability obesity    (08/26/2009 10:11)      Past History:  Past Medical History: 59, XXX, absent uterus so amenorrheic and no need for pap, right ovary, small left ovary - now with premature ovarian failure h/o enuresis 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 with an underlying malignancy- followed in [place] and by [place] (Dr Lorrene Reid) - on chronic immunosuppressants including prednisone Anemia-on procrit secondary hyperparathyroidism Glucose intolerance s/p transplant  at least in part due to prednisone therapay, now diabetic in 10/2007 - insulin dependent, difficult to control due to learning disability HTN - labile - primarily managed by renal doctors learning diability obesity  Past Surgical History: Multiple AV grafts  Parathyroidectomy - 10/13/2004,  Peritoneal abscss surgery  Kidney Transplant  Social History: Lives with mother, attends [employer] (early childhood dev). ; No tobacco, etoh or drug use. Sexually active.  states she would like to have children but cannot have them naturally due to anatomy

## 2010-04-14 NOTE — Letter (Signed)
Summary: Disability papers  Disability papers   Imported By: Audie Clear 12/18/2009 13:59:41  _____________________________________________________________________  External Attachment:    Type:   Image     Comment:   External Document

## 2010-04-14 NOTE — Progress Notes (Signed)
Summary: phn msg  Phone Note Call from Patient Call back at [phone]   Caller: Patient Summary of Call: still having watery loose diarrhea - has appt here on Friday - said we might wants to call [facility] where she is seen also to see what they think... Initial call taken by: Audie Clear,  January 28, 2010 2:48 PM  Follow-up for Phone Call        patient has ben taking  Loperamide as directed and will helpfor a short time but then after 3-4 hours will have watery stool again. has appointmnt with Dr. Earley Favor on Friday. will send  message to  Dr. Earley Favor now to ask for further instructions or if patient needs to come in tomorrow instead of Friday.  Follow-up by: Marcell Barlow RN,  January 28, 2010 3:52 PM  Additional Follow-up for Phone Call Additional follow up Details #1::        Called pt back.  Still having clear watery diarrhea. Diarrhea About 4x/day, which is less than before.  No fever, chills, abd pain.  Keeping up with her fluids (gatorade, broth, jello, chicken noodle soup). Able to keep food down.  She is actually feeling ok otherwise. Offered appt tomorrow, but pt not sure she can make it.  Pt will keep Fri appt for now.  She spoke to her renal transplant doctor in [place] and was told that labs from 11/9 looked fine.   The last antibiotic use was May 2011.   Additional Follow-up by: Leeon Makar MD,  January 28, 2010 4:13 PM

## 2010-04-14 NOTE — Miscellaneous (Signed)
Summary: walk in  Clinical Lists Changes c/o n&v, diarrhea x 2 days. wants to be seen. appt made with pcp now.Elige Radon RN  June 04, 2009 9:19 AM

## 2010-04-14 NOTE — Progress Notes (Signed)
  Phone Note Outgoing Call   Call placed by: Patria Mane  MD,  June 26, 2009 11:59 AM Call placed to: [phone] Summary of Call: attempted to call twice to see how eye was doing - once at 8:30am, once at 12pm.  no answer.  2nd attempt left msg on answering machine to call back if having any concerns.  Initial call taken by: Patria Mane  MD,  June 26, 2009 12:00 PM     Appended Document:  pt called back and stated that she couldn't sleep last night and her eye was really bothering her. pls call about giving her something else  Appended Document:  called Mehe[facility] to see how eye was doing. she says eye still itching and watery.  would like eye drop instead.   Clinical Lists Changes  Medications: Removed medication of ERYTHROMYCIN 5 MG/GM OINT (ERYTHROMYCIN) apply to lower eye lid three times a day for 7 days for pink eye.  Disp 1 tube Added new medication of POLYMYXIN B-TRIMETHOPRIM 10000-0.1 UNIT/ML-% SOLN (POLYMYXIN B-TRIMETHOPRIM) 2 drops in affected eye 4 times daily for 7 days - Signed Rx of POLYMYXIN B-TRIMETHOPRIM 10000-0.1 UNIT/ML-% SOLN (POLYMYXIN B-TRIMETHOPRIM) 2 drops in affected eye 4 times daily for 7 days;  #1 x 0;  Signed;  Entered by: Patria Mane  MD;  Authorized by: Patria Mane  MD;  Method used: Electronically to [facility]. [phone]*, [id number]  [address], [place], [place], [place]  [address], Ph: [phone] or [phone], Fax: [phone]    Prescriptions: POLYMYXIN B-TRIMETHOPRIM 10000-0.1 UNIT/ML-% SOLN (POLYMYXIN B-TRIMETHOPRIM) 2 drops in affected eye 4 times daily for 7 days  #1 x 0   Entered and Authorized by:   Patria Mane  MD   Signed by:   Patria Mane  MD on 06/26/2009   Method used:   Electronically to        [facility]. [address] [phone]* (retail)       [address]       [place], [place]  [address]       Ph: [phone] or [phone]       Fax: [phone]   RxID:   [id number]

## 2010-04-14 NOTE — Consult Note (Signed)
Summary: [facility]   Imported By: Raymond Gurney 04/24/2009 14:59:12  _____________________________________________________________________  External Attachment:    Type:   Image     Comment:   External Document

## 2010-04-14 NOTE — Assessment & Plan Note (Signed)
Summary: DIARRHEA[facility]   Vital Signs:  Patient profile:   25 year old female Height:      65.25 inches Weight:      197.19 pounds BMI:     32.68 BSA:     1.97 Temp:     98.6 degrees F Pulse rate:   96 / minute BP sitting:   134 / 92  Vitals Entered By: Christen Bame CMA (January 29, 2010 8:47 AM) CC: diarrhea x 6 days Is Patient Diabetic? Yes Did you bring your meter with you today? No Pain Assessment Patient in pain? no        Primary Care Patrich Heinze:  Cletus Gash MD  CC:  diarrhea x 6 days.  History of Present Illness: 1. Diarrhea: - Pt has had diarrhea for the past 6 days - She had normal stools prior to that - Since then she has had 3-4 watery stools a day - The stools are not described as being foul smelling, bloody, or containing mucous  ROS: denies fevers, abdominal pain, n/v.  Endorses good oral intake of food and liquid.  Has been drinking lots of water.  PMHx: S/P kidney transplant.  Has contacted her nephrologist in [place] who told her that all of her most recent labs were good and that this was probably a virus  Meds: significant for Prednisone, MTX, and Bactrim  Habits & Providers  Alcohol-Tobacco-Diet     Tobacco Status: never  Current Medications (verified): 1)  Myfortic 360 Mg  Tbec (Mycophenolate Sodium) .... 3 Tablets Two Times A Day 2)  Prograf 1 Mg Caps (Tacrolimus) .... 4 Caps By Mouth Two Times A Day 3)  Tums 500 Mg  Chew (Calcium Carbonate Antacid) .... Prn 4)  Procrit 10000 Unit/ml  Soln (Epoetin Alfa) .... 30,000 Units Q Month 5)  Glucotrol Xl 10 Mg Xr24h-Tab (Glipizide) .Marland Kitchen.. 1 Tab By Mouth Bid 6)  Lantus 100 Unit/ml Soln (Insulin Glargine) .... 60 Units Subcutaneously At Bedtime. Suppply Quant Sufficient For 1 Month.  Disp With Needles, Syringes 7)  Ascensia Breeze 2  Disk (Glucose Blood) .... Test Blood Sugar Four Times Daily. 8)  Bactrim 400-80 Mg Tabs (Sulfamethoxazole-Trimethoprim) .Marland Kitchen.. 1 Tab Daily Per Kidney Doctor. 9)   Prednisone 5 Mg Tabs (Prednisone) .... Unknown Dosage Per Kidney Transplant Doctors 10)  Hydroxyzine Hcl 25 Mg Tabs (Hydroxyzine Hcl) .Marland Kitchen.. 1 By Mouth Two Times A Day As Needed Itching 11)  Ranitidine Hcl 150 Mg Tabs (Ranitidine Hcl) .Marland Kitchen.. 1 By Mouth Once Daily As Needed 12)  Clonidine Hcl 0.2 Mg Tabs (Clonidine Hcl) .Marland Kitchen.. 1 By Mouth Q6hr As Needed Sbp >165 Per [facility] 13)  B-D Ultra-Fine 30gx0.89ml .... Use As Directed 14)  Novolog 100 Unit/ml Soln (Insulin Aspart) .... Take 5 Units At [facility] Meal of The Day. Disp 2 Vials. Disp With Insulin Syringes and Needles For Once Daily Dosing 15)  Simvastatin 20 Mg Tabs (Simvastatin) .... One By Mouth Daily 16)  Lisinopril 10 Mg Tabs (Lisinopril) .... Take One By Mouth Daily 17)  Loperamide Hcl 2 Mg Caps (Loperamide Hcl) .... 2 Capsule By Mouth X 1, Then 1 Capsule By Mouth After Each Loose Stools.  Max 8 Capsules Per Day. 18)  Ultimate Probiotic Formula  Caps (Lactobacillus) .... 2 Tabs By Mouth Twice A Day  Allergies: No Known Drug Allergies  Past History:  Past Medical History: Reviewed history from 08/26/2009 and no changes required. 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 vitals  reviewed.  Eyes:  Not sunken.  Normal conjunctiva Mouth:  moist mucous membranes.  OP pink and moist Neck:  supple, full ROM, and no masses.   Lungs:  Normal respiratory effort, chest expands symmetrically. Lungs are clear to auscultation, no crackles or wheezes. Heart:  Normal rate and regular rhythm. S1 and S2 normal without gallop, murmur, click, rub or other extra sounds. Abdomen:  Bowel sounds positive,abdomen soft and non-tender without masses, organomegaly or hernias noted.  Scars from previous surgeries.  Transplanted kidneys palpable. Msk:  no joint tenderness and no joint swelling.   Extremities:  no LE edema.  No cyanosis. Neurologic:  alert & oriented X3.  Normal mentation.   Skin:  turgor normal, color normal, and no rashes.   Cervical Nodes:  no anterior cervical adenopathy.   Psych:  not anxious appearing and not depressed appearing.   Additional Exam:  Last Cr. 1.51   Impression & Recommendations:  Problem # 1:  DIARRHEA (ICD-787.91) Assessment Unchanged Persists.  She is only having diarrhea and no other symptoms.  This most likely is a viral gastroenteritis.  However, she is more complicated because of her kidney transplant.  Will recommend Probiotics to help with the diarrhea.  Will check CBC to see if she has elevated WBCs.  Will check stool C. diff because she has been on chronic Bactrim.  Will also check a stool culture.  Precautions given. Her updated medication list for this problem includes:    Loperamide Hcl 2 Mg Caps (Loperamide hcl) .Marland Kitchen... 2 capsule by mouth x 1, then 1 capsule by mouth after each loose stools.  max 8 capsules per day.    Ultimate Probiotic Formula Caps (Lactobacillus) .Marland Kitchen... 2 tabs by mouth twice a day  Orders: CBC-[facility] [id number]) Basic Met-[facility] [id number]) [facility]- Est Level  3 ([id number])Future Orders: Stool C-Diff toxin assay- [place] [id number]) ... 01/22/2011 Culture, Stool- [place] [phone]) ... 01/15/2011  Problem # 2:  CHRONIC KIDNEY DISEASE  STAGE II (MILD) (ICD-585.2) Assessment: Unchanged  Will check BMET today to make sure that she is not going into renal failure because of the diarrhea.  I do not think that she is clinically dehydrated and has been eating and drinking well.    Orders: [place]- Est Level  3 [id number])  Complete Medication List: 1)  Myfortic 360 Mg Tbec (Mycophenolate sodium) .... 3 tablets two times a day 2)  Prograf 1 Mg Caps (Tacrolimus) .... 4 caps by mouth two times a day 3)  Tums 500 Mg Chew (Calcium carbonate antacid) .... Prn 4)  Procrit 10000 Unit/ml Soln (Epoetin alfa) .... 30,000 units q month 5)  Glucotrol Xl 10 Mg Xr24h-tab (Glipizide) .Marland Kitchen.. 1 tab by mouth bid 6)  Lantus 100 Unit/ml Soln (Insulin glargine) .... 60 units subcutaneously at bedtime. suppply quant sufficient for 1 month.  disp with needles, syringes 7)  Ascensia Breeze 2 Disk (Glucose blood) .... Test blood sugar four  times daily. 8)  Bactrim 400-80 Mg Tabs (Sulfamethoxazole-trimethoprim) .Marland Kitchen.. 1 tab daily per kidney doctor. 9)  Prednisone 5 Mg Tabs (Prednisone) .... Unknown dosage per kidney transplant doctors 10)  Hydroxyzine Hcl 25 Mg Tabs (Hydroxyzine hcl) .Marland Kitchen.. 1 by mouth two times a day as needed itching 11)  Ranitidine Hcl 150 Mg Tabs (Ranitidine hcl) .Marland Kitchen.. 1 by mouth once daily as needed 12)  Clonidine Hcl 0.2 Mg Tabs (Clonidine hcl) .Marland Kitchen.. 1 by mouth q6hr as needed sbp >165 per [facility] 13)  B-d Ultra-fine 30gx0.5ml  .... Use as directed 14)  Novolog 100 Unit/ml Soln (Insulin aspart) .... Take 5 units at largest meal of the day. disp 2 vials. disp with insulin syringes and needles for once daily dosing 15)  Simvastatin 20 Mg Tabs (Simvastatin) .... One by mouth daily 16)  Lisinopril 10 Mg Tabs (Lisinopril) .... Take one by mouth daily 17)  Loperamide Hcl 2 Mg Caps (Loperamide hcl) .... 2 capsule by mouth x 1, then 1 capsule by mouth after each loose stools.  max 8 capsules per day. 18)  Ultimate Probiotic Formula Caps  (Lactobacillus) .... 2 tabs by mouth twice a day  Patient Instructions: 1)  We will check some labs just to make sure you are not having a bacterial infection that is causing your diarrhea 2)  It is most likely a virus 3)  I would like for you to start taking Probiotics.  This should help with the diarrhea 4)  Please schedule a follow up appointment early next week 5)  If you stop being able to eat or drink, your diarrhea gets worse, if you develop abdominal pain or fever, you get light headed or dizzy then please return to clinic or go to the ED. Prescriptions: ULTIMATE PROBIOTIC FORMULA  CAPS (LACTOBACILLUS) 2 tabs by mouth twice a day  #30 x 1   Entered and Authorized by:   Mylinda Latina MD   Signed by:   Mylinda Latina MD on 01/29/2010   Method used:   Electronically to        [facility]. [address] [phone]* (retail)       [address]       [place], [place]  [address]       Ph: [phone] or [phone]       Fax: [phone]   RxID:   [id number]    Orders Added: 1)  CBC-[facility] [id number] 2)  Basic Met-[facility] [id number] 3)  [id number] 4)  Culture, Stool- [place] [id number] 5)  [place]- Est Level  3 [id number]

## 2010-04-14 NOTE — Assessment & Plan Note (Signed)
Summary: f/up,tcb   Vital Signs:  Patient profile:   25 year old female Height:      65.25 inches Weight:      204 pounds BMI:     33.81 Temp:     99.4 degrees F Pulse rate:   96 / minute BP sitting:   151 / 109  Vitals Entered By: Elige Radon RN (November 12, 2009 1:35 PM)  Serial Vital Signs/Assessments:  Comments: BP 134/94 By: Marcell Barlow RN    Primary Provider:  Cletus Gash MD   History of Present Illness: Pt. says that she has been writing down her blood sugars and blood pressure readings every day.  AM blood sugars range from 100-200, PM from 100-300.  She says she is taking her insulin every day.    BP has been 120-140's/80-90's.  She says she feels nervous when she comes to the doctor. Pt. has never had her bp cuff compared to the one at the office.  Says she does not want to be on a blood pressure medicine and does she really need to take one.    Says she has been trying to eat right and exercise but feels like it is expensive.  She needs her disability paperwork filled out.  She says that last time a resident filled it out, it was denied.  I spoke to her mom on the phone who says she was give a letter that whomever filled it out did not have a licence number, and so she was denied.     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 3 Tablets Two Times A Day 2)  Prograf 1 Mg Caps (Tacrolimus) .... 4 Caps By Mouth Two Times A Day 3)  Tums 500 Mg  Chew (Calcium Carbonate Antacid) .... Prn 4)  Procrit 10000 Unit/ml  Soln (Epoetin Alfa)  .... 30,000 Units Q Month 5)  Glucotrol Xl 10 Mg Xr24h-Tab (Glipizide) .Marland Kitchen.. 1 Tab By Mouth Bid 6)  Lantus 100 Unit/ml Soln (Insulin Glargine) .... 50 Units Subcutaneously At Bedtime. Suppply Quant Sufficient For 1 Month.  Disp With Needles, Syringes 7)  Ascensia Breeze 2  Disk (Glucose Blood) .... Test Blood Sugar Four Times Daily. 8)  Bactrim 400-80 Mg Tabs (Sulfamethoxazole-Trimethoprim) .Marland Kitchen.. 1 Tab Daily Per Kidney Doctor. 9)  Prednisone 5 Mg Tabs (Prednisone) .... Unknown Dosage Per Kidney Transplant Doctors 10)  Hydroxyzine Hcl 25 Mg Tabs (Hydroxyzine Hcl) .Marland Kitchen.. 1 By Mouth Two Times A Day As Needed Itching 11)  Ranitidine Hcl 150 Mg Tabs (Ranitidine Hcl) .Marland Kitchen.. 1 By Mouth Once Daily As Needed 12)  Clonidine Hcl 0.2 Mg Tabs (Clonidine Hcl) .Marland Kitchen.. 1 By Mouth Q6hr As Needed Sbp >165 Per [facility] 13)  B-D Ultra-Fine 30gx0.42ml .... Use As Directed 14)  Novolog 100 Unit/ml Soln (Insulin Aspart) .... Take 5 Units At [facility] Meal of The Day. Disp 2 Vials. Disp With Insulin Syringes and Needles For Once Daily Dosing 15)  Simvastatin 20 Mg Tabs (Simvastatin) .... One By Mouth Daily 16)  Lisinopril 10 Mg Tabs (Lisinopril) .... Take One By Mouth Daily  Allergies:  No Known Drug Allergies  Family History: Reviewed history from 01/22/2009 and no changes required. mother with HTN  Social History: Reviewed history from 08/26/2009 and no changes required. Lives with mother, attends [employer] (early childhood dev). ; No tobacco, etoh or drug use. Sexually active.  states she would like to have children but cannot have them naturally due to anatomy  Physical Exam  General:  Well-developed,well-nourished,in no acute distress; alert.  Head:  Normocephalic and atraumatic without obvious abnormalities. No apparent alopecia or balding. Eyes:  No corneal or conjunctival inflammation noted. EOMI. Perrla. Funduscopic exam benign, without hemorrhages, exudates or papilledema. Vision grossly normal. Mouth:  Oral  mucosa and oropharynx without lesions or exudates.  Teeth in good repair. Neck:  No deformities, masses, or tenderness noted. Lungs:  Normal respiratory effort, chest expands symmetrically. Lungs are clear to auscultation, no crackles or wheezes. Heart:  Normal rate and regular rhythm. S1 and S2 normal without gallop, murmur, click, rub or other extra sounds. Abdomen:  Bowel sounds positive,abdomen soft and non-tender without masses, organomegaly or hernias noted. Pulses:  R and L radial,dorsalis pedis and posterior tibial pulses are full and equal bilaterally   Impression & Recommendations:  Problem # 1:  DM (ICD-250.00)  By pt's recording, it sounds like her blood sugars are under better control.  Will check an A1c next visit to see if it has improved.  I encouraged her to continue to take her insulin every day, to watch what she eats, and to exercise.  She says she does not want to return to Diabetes education class at this time.  Her updated medication list for this problem includes:    Glucotrol Xl 10 Mg Xr24h-tab (Glipizide) .Marland Kitchen... 1 tab by mouth bid    Lantus 100 Unit/ml Soln (Insulin glargine) .Marland KitchenMarland KitchenMarland KitchenMarland Kitchen 50 units subcutaneously at bedtime. suppply quant sufficient for 1 month.  disp with needles, syringes    Novolog 100 Unit/ml Soln (Insulin aspart) .Marland Kitchen... Take 5 units at largest meal of the day. disp 2 vials. disp with insulin syringes and needles for once daily dosing    Lisinopril 10 Mg Tabs (Lisinopril) .Marland Kitchen... Take one by mouth daily  Orders: [place]- Est  Level 4 [id number])  Problem # 2:  ESSENTIAL HYPERTENSION, BENIGN (ICD-401.1)  Pt. with HTN as well as renal transplant, will start ACE i today, I explained it was a BP medicine that is also good for her kidneys.  Her updated medication list for this problem includes:    Clonidine Hcl 0.2 Mg Tabs (Clonidine hcl) .Marland Kitchen... 1 by mouth q6hr as needed sbp >165 per [facility]    Lisinopril 10 Mg Tabs (Lisinopril) .Marland Kitchen... Take one by mouth  daily  Orders: [place]- Est  Level 4 [id number])  Problem # 3:  KIDNEY TRANSPLANTATION, HX OF (ICD-V42.0) Pt. seems to be doing well with immunosuppresents, again, I think she would benefit from taking an Ace inhibitor.   Problem # 4:  OBESITY (ICD-278.00)  She has lost some weight, and I congratulated her.  She has refused diabetic teaching at this time, which i think would help with her weight as well.   Orders: [place]- Est  Level 4 [id number])  Problem # 5:  LEARNING DISABILITY (ICD-315.2)  Pt. needs disability paperwork filled out.  I am not sure why she was denied when a resident filled it out previously, but I have asked her mother to come with her to the next appointment via our phone conversation today, and to bring the paperwork and  contact info of the person who told her that is why [place] was denied.  I have asked them to make an appointment specifically for filling out disability paperwork in 4 weeks.   Orders: [place]- Est  Level 4 ([id number])  Complete Medication List: 1)  Myfortic 360 Mg Tbec (Mycophenolate sodium) .... 3 tablets two times a day 2)  Prograf 1 Mg Caps (Tacrolimus) .... 4 caps by mouth two times a day 3)  Tums 500 Mg Chew (Calcium carbonate antacid) .... Prn 4)  Procrit 10000 Unit/ml Soln (Epoetin alfa) .... 30,000 units q month 5)  Glucotrol Xl 10 Mg Xr24h-tab (Glipizide) .Marland Kitchen.. 1 tab by mouth bid 6)  Lantus 100 Unit/ml Soln (Insulin glargine) .... 50 units subcutaneously at bedtime. suppply quant sufficient for 1 month.  disp with needles, syringes 7)  Ascensia Breeze 2 Disk (Glucose blood) .... Test blood sugar four times daily. 8)  Bactrim 400-80 Mg Tabs (Sulfamethoxazole-trimethoprim) .Marland Kitchen.. 1 tab daily per kidney doctor. 9)  Prednisone 5 Mg Tabs (Prednisone) .... Unknown dosage per kidney transplant doctors 10)  Hydroxyzine Hcl 25 Mg Tabs (Hydroxyzine hcl) .Marland Kitchen.. 1 by mouth two times a day as needed itching 11)  Ranitidine Hcl 150 Mg Tabs (Ranitidine hcl) .Marland Kitchen.. 1 by mouth once  daily as needed 12)  Clonidine Hcl 0.2 Mg Tabs (Clonidine hcl) .Marland Kitchen.. 1 by mouth q6hr as needed sbp >165 per [facility] 13)  B-d Ultra-fine 30gx0.40ml  .... Use as directed 14)  Novolog 100 Unit/ml Soln (Insulin aspart) .... Take 5 units at largest meal of the day. disp 2 vials. disp with insulin syringes and needles for once daily dosing 15)  Simvastatin 20 Mg Tabs (Simvastatin) .... One by mouth daily 16)  Lisinopril 10 Mg Tabs (Lisinopril) .... Take one by mouth daily  Patient Instructions: 1)  It was nice to meet you today.  Please make a longer appointment in 4 weeks so we can fill out your diability paperwork.   2)  Please continue to watch your diet and exercise.  Continue taking your insulin EVERY Day. 3)  Your new blood pressure medicine is Lisinopril.  At your next visit, please bring in your blood pressure cuff so we can compare it to the one at the office.   Prescriptions: LISINOPRIL 10 MG TABS (LISINOPRIL) take one by mouth daily  #30 x 3   Entered and Authorized by:   Cletus Gash MD   Signed by:   Cletus Gash MD on 11/12/2009   Method used:   Electronically to        [facility]. [address] [phone]* (retail)       [address]       [place], [place]  [address]       Ph: [phone] or [phone]       Fax: [phone]   RxID:   [id number] B-D ULTRA-FINE 30GX0.5ML use as directed  #100 x 11   Entered and Authorized by:   Cletus Gash MD   Signed by:   Cletus Gash MD on 11/12/2009   Method used:   Print then Give to Patient   RxID:   [id number]    Prevention & Chronic Care Immunizations   Influenza vaccine: Fluvax Non-MCR  (12/04/2007)   Influenza vaccine due: 12/03/2008    Tetanus booster: 04/09/2008: Tdap   Tetanus booster due: 04/09/2018    Pneumococcal vaccine: Not documented  Other Screening   Pap smear: normal  (  09/25/2007)   Pap smear action/deferral: Not indicated-other  (05/30/2009)   Pap smear  due: 09/24/2009   Smoking status: never  (09/02/2009)  Diabetes Mellitus   HgbA1C: 9.4  (09/02/2009)   Hemoglobin A1C due: 02/01/2008    Eye exam: Not documented    Foot exam: yes  (05/30/2009)   High risk foot: Not documented   Foot care education: Not documented    Urine microalbumin/creatinine ratio: Not documented  Hypertension   Last Blood Pressure: 151 / 109  (11/12/2009)   Serum creatinine: 1.35  (07/17/2009)   Serum potassium 5.2  (09/25/2007)  Self-Management Support :   Personal Goals (by the next clinic visit) :     Personal A1C goal: 8  (11/19/2008)     Personal blood pressure goal: 130/80  (11/19/2008)     Personal LDL goal: 100  (11/19/2008)    Diabetes self-management support: Copy of home glucose meter record, CBG self-monitoring log, Written self-care plan  (09/02/2009)    Diabetes self-management support not done because: Good outcomes  (03/11/2009)    Hypertension self-management support: BP self-monitoring log, Written self-care plan  (11/19/2008)    Hypertension self-management support not done because: Good outcomes  (09/02/2009)

## 2010-04-14 NOTE — Assessment & Plan Note (Signed)
Summary: Diarrhea x 4 days   Vital Signs:  Patient profile:   25 year old female Weight:      193 pounds Temp:     98.2 degrees F oral Pulse rate:   93 / minute BP sitting:   122 / 80  (right arm)  Vitals Entered By: Mauricia Area CMA, (January 26, 2010 10:42 AM) CC: diarrhea x friday. vomitting x 1 last night. Is Patient Diabetic? No Pain Assessment Patient in pain? no        Primary Care Provider:  Cletus Gash MD  CC:  diarrhea x friday. vomitting x 1 last night..  History of Present Illness: 25 y/o F s/p R kidney transplant 2007, DM, HLD, presents with diarrhea   Diarrhea x 5 days.  4-5 watery BMs per day.  Started to become more solid today, but still very watery.  No history of antibiotic use per patient.  No hospitalizaiton.     No abd pain No fever, no chils No nausea, but Vomiting started last night x 2 after eating Hamburger helper.   Have not eaten today although she is on Novolog for DM  CBGs fasting 151-286, pm 168-304 Sick contact: none.  Lives with her mother, who is asymptomatic.    Dysuria x 3 days.  No frequency.  No urgency.  No fever, abd pain.    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 GI:  Complains of diarrhea and vomiting; denies abdominal pain, bloody stools, hemorrhoids, nausea, and vomiting blood. GU:  Complains of dysuria; denies abnormal vaginal bleeding, discharge, genital sores, hematuria, incontinence, nocturia, urinary frequency, and urinary hesitancy.  Physical Exam  General:  Well-developed,well-nourished,in no acute distress; alert,appropriate and cooperative throughout examination. vitals reviewed.  Abdomen:  Bowel sounds positive,abdomen soft and non-tender without masses, organomegaly or hernias noted.  Scars  from previous surgeries.   Rectal:  No external abnormalities noted. Normal sphincter tone. No rectal masses or tenderness.no external abnormalities, no hemorrhoids, normal sphincter tone, no masses, no tenderness, no fissures, no fistulae, and no perianal rash.   FOBT: not able to get fecal sample from empty vault Extremities:  No clubbing, cyanosis, edema, or deformity noted with normal full range of motion of all joints.   Skin:  Intact without suspicious lesions or rashes Inguinal Nodes:  No significant adenopathy   Impression & Recommendations:  Problem # 1:  DIARRHEA (ICD-787.91) Assessment New Diarrhea is likely not bacterial and pt without history of recent antibiotic use.  May be getting better as she saw more solids today even though still watery.  Abd exam benign, pt not febrile and does not appear toxic.  Will try loperamide, which is not contraindicated in renal pts (s/p R renal transplant).  Pt to rtc to see me on Fri.    Her updated medication list for this problem includes:    Loperamide Hcl 2 Mg Caps (Loperamide hcl) .Marland Kitchen... 2 capsule by mouth x 1, then 1 capsule by mouth after each loose stools.  max 8 capsules per day.  Orders: [place]- Est Level  3 [id number])  Problem # 2:  DYSURIA (ICD-788.1) Assessment: New Likely from anal irritation or current complaints of diarrhea as UA is negative.     Orders: Urinalysis-[facility] (00000) [place]- Est Level  3 [id number])  Complete Medication List: 1)  Myfortic 360 Mg Tbec (Mycophenolate sodium) .... 3 tablets two times a day 2)  Prograf 1 Mg Caps (Tacrolimus) .... 4 caps by mouth two times a day 3)  Tums 500 Mg Chew (Calcium carbonate antacid) .... Prn 4)  Procrit 10000 Unit/ml Soln (Epoetin alfa) .... 30,000 units q month 5)  Glucotrol Xl 10 Mg Xr24h-tab (Glipizide) .Marland Kitchen.. 1 tab by mouth bid 6)  Lantus 100 Unit/ml Soln (Insulin glargine) .... 60 units subcutaneously at bedtime. suppply quant sufficient for 1 month.  disp with needles,  syringes 7)  Ascensia Breeze 2 Disk (Glucose blood) .... Test blood sugar four times daily. 8)  Bactrim 400-80 Mg Tabs (Sulfamethoxazole-trimethoprim) .Marland Kitchen.. 1 tab daily per kidney doctor. 9)  Prednisone 5 Mg Tabs (Prednisone) .... Unknown dosage per kidney transplant doctors 10)  Hydroxyzine Hcl 25 Mg Tabs (Hydroxyzine hcl) .Marland Kitchen.. 1 by mouth two times a day as needed itching 11)  Ranitidine Hcl 150 Mg Tabs (Ranitidine hcl) .Marland Kitchen.. 1 by mouth once daily as needed 12)  Clonidine Hcl 0.2 Mg Tabs (Clonidine hcl) .Marland Kitchen.. 1 by mouth q6hr as needed sbp >165 per [facility] 13)  B-d Ultra-fine 30gx0.9ml  .... Use as directed 14)  Novolog 100 Unit/ml Soln (Insulin aspart) .... Take 5 units at largest meal of the day. disp 2 vials. disp with insulin syringes and needles for once daily dosing 15)  Simvastatin 20 Mg Tabs (Simvastatin) .... One by mouth daily 16)  Lisinopril 10 Mg Tabs (Lisinopril) .... Take one by mouth daily 17)  Loperamide Hcl 2 Mg Caps (Loperamide hcl) .... 2 capsule by mouth x 1, then 1 capsule by mouth after each loose stools.  max 8 capsules per day.  Patient Instructions: 1)  Please schedule a follow-up appointment Friday for diarrhea. 2)  Try Loperamide 2 tabs at first, then 1 tab after each diarrhea episode.  No more than 8 per day.   Prescriptions: LOPERAMIDE HCL 2 MG CAPS (LOPERAMIDE HCL) 2 capsule by mouth x 1, then 1 capsule by mouth after each loose stools.  Max 8 capsules per day.  #30 x 1   Entered and Authorized by:   Merla Riches MD   Signed by:   Merla Riches MD on 01/26/2010   Method used:   Electronically to        [facility]. [address] [phone]* (retail)       [address]       [place], [place]  [address]       Ph: [phone] or [phone]       Fax: [phone]   RxID:   [id number]    Orders Added: 1)  Urinalysis-[facility] [00000] 2)  [facility]- Est Level  3 [[id number]]    Laboratory Results   Urine Tests  Date/Time Received: January 26, 2010 11:35  AM  Date/Time Reported: January 26, 2010 11:53 AM   Routine Urinalysis   Color: yellow Appearance: Clear Glucose: 100   (Normal Range: Negative) Bilirubin: negative   (Normal Range: Negative) Ketone: negative   (Normal Range: Negative) Spec. Gravity: >=1.030   (Normal Range: 1.003-1.035) Blood: negative   (Normal Range: Negative) pH: 5.5   (Normal Range: 5.0-8.0) Protein: trace   (Normal Range: Negative) Urobilinogen: 0.2   (Normal Range: 0-1) Nitrite: negative   (Normal Range: Negative) Leukocyte Esterace: negative   (Normal Range: Negative)    Comments: ...............test performed by......Marland KitchenBonnie A. Martinique, MLS (ASCP)cm

## 2010-04-14 NOTE — Consult Note (Signed)
Summary: [facility]   Imported By: Audie Clear 01/30/2010 16:44:17  _____________________________________________________________________  External Attachment:    Type:   Image     Comment:   External Document

## 2010-04-14 NOTE — Assessment & Plan Note (Signed)
Summary: n& v, diarrhea x 2 days/[place]   Vital Signs:  Patient profile:   25 year old female Height:      66.25 inches Weight:      203.5 pounds BMI:     32.72 Temp:     99.3 degrees F oral Pulse rate:   103 / minute BP sitting:   139 / 86  (left arm) Cuff size:   regular  Vitals Entered By: Levert Feinstein LPN (March 23, [id number] [id number] AM) CC: vomiting, diarrhea, weak x 2 days Is Patient Diabetic? Yes Did you bring your meter with you today? No Pain Assessment Patient in pain? yes     Location: stomach   Primary Care Provider:  Patria Mane  MD  CC:  vomiting, diarrhea, and weak x 2 days.  History of Present Illness: N/V/D: since yesterday.  has felt weak as a result.  no one else around her sick.  hasn't vomited yet this AM.  denies fevers but doesn't have thermometer per her report at home.  is scheduled to go today to get lab work done at [facility].  hasn't been able to keep her meds down.  denies any blood in diarrhea or vomitus.  otherwise no runny nose, cough, abdominal pain.  sugars have been not going low or higher than normal despite this.   Habits & Providers  Alcohol-Tobacco-Diet     Tobacco Status: never  Current Medications (verified): 1)  Myfortic 360 Mg  Tbec (Mycophenolate Sodium) .... 3 Tablets Two Times A Day 2)  Prograf 1 Mg Caps (Tacrolimus) .... 4 Caps By Mouth Two Times A Day 3)  Tums 500 Mg  Chew (Calcium Carbonate Antacid) .... Prn 4)  Procrit 10000 Unit/ml  Soln (Epoetin Alfa) .... 30,000 Units Q Month 5)  Glucotrol Xl 10 Mg Xr24h-Tab (Glipizide) .Marland Kitchen.. 1 Tab By Mouth Bid 6)  Lantus 100 Unit/ml Soln (Insulin Glargine) .... 50 Units Subcutaneously At Bedtime. Suppply Quant Sufficient For 1 Month.  Disp With Needles, Syringes 7)  Ascensia Breeze 2  Disk (Glucose Blood) .... Test Blood Sugar Four Times Daily. 8)  Bactrim 400-80 Mg Tabs (Sulfamethoxazole-Trimethoprim) .Marland Kitchen.. 1 Tab Daily Per Kidney Doctor. 9)  Prednisone 5 Mg Tabs (Prednisone) .... Unknown Dosage Per  Kidney Transplant Doctors 10)  Hydroxyzine Hcl 25 Mg Tabs (Hydroxyzine Hcl) .Marland Kitchen.. 1 By Mouth Two Times A Day As Needed Itching 11)  Ranitidine Hcl 150 Mg Tabs (Ranitidine Hcl) .Marland Kitchen.. 1 By Mouth Once Daily As Needed 12)  Clonidine Hcl 0.2 Mg Tabs (Clonidine Hcl) .Marland Kitchen.. 1 By Mouth Q6hr As Needed Sbp >165 Per [facility] 13)  B-D Ultra-Fine 30gx0.38ml .... Use As Directed 14)  Promethazine Hcl 25 Mg Tabs (Promethazine Hcl) .... 1/2 - 1 Tab By Mouth Three Times A Day As Needed Nausea  Allergies (verified): No Known Drug Allergies  Review of Systems       per HPI  Physical Exam  General:  Well-developed,obese,in no acute distress; alert,appropriate and cooperative throughout examination VS reviewed Mouth:  MMM Lungs:  Normal respiratory effort, chest expands symmetrically. Lungs are clear to auscultation, no crackles or wheezes. Heart:  Normal rate and regular rhythm. S1 and S2 normal without gallop, murmur, click, rub or other extra sounds. Abdomen:  Bowel sounds positive,abdomen soft and non-tender without masses, organomegaly or hernias noted.   Impression & Recommendations:  Problem # 1:  NAUSEA AND VOMITING (ICD-787.01) Assessment New unclear etiology but patient does not appear toxic at this time.  could be gastroenteritis.  could be gastroparesis.  rx antiemetic, given comorbitidites if not better by friday morning return visit or if gets worse sooner return.  given information to have labs from [facility] faxed here as well.  advised to watch sugars closely while ill.  push fluids.    Orders: [place]- Est Level  3 [id number])  Complete Medication List: 1)  Myfortic 360 Mg Tbec (Mycophenolate sodium) .... 3 tablets two times a day 2)  Prograf 1 Mg Caps (Tacrolimus) .... 4 caps by mouth two times a day 3)  Tums 500 Mg Chew (Calcium carbonate antacid) .... Prn 4)  Procrit 10000 Unit/ml Soln (Epoetin alfa) .... 30,000 units q month 5)  Glucotrol Xl 10 Mg Xr24h-tab (Glipizide) .Marland Kitchen.. 1 tab by  mouth bid 6)  Lantus 100 Unit/ml Soln (Insulin glargine) .... 50 units subcutaneously at bedtime. suppply quant sufficient for 1 month.  disp with needles, syringes 7)  Ascensia Breeze 2 Disk (Glucose blood) .... Test blood sugar four times daily. 8)  Bactrim 400-80 Mg Tabs (Sulfamethoxazole-trimethoprim) .Marland Kitchen.. 1 tab daily per kidney doctor. 9)  Prednisone 5 Mg Tabs (Prednisone) .... Unknown dosage per kidney transplant doctors 10)  Hydroxyzine Hcl 25 Mg Tabs (Hydroxyzine hcl) .Marland Kitchen.. 1 by mouth two times a day as needed itching 11)  Ranitidine Hcl 150 Mg Tabs (Ranitidine hcl) .Marland Kitchen.. 1 by mouth once daily as needed 12)  Clonidine Hcl 0.2 Mg Tabs (Clonidine hcl) .Marland Kitchen.. 1 by mouth q6hr as needed sbp >165 per [facility] 13)  B-d Ultra-fine 30gx0.79ml  .... Use as directed 14)  Promethazine Hcl 25 Mg Tabs (Promethazine hcl) .... 1/2 - 1 tab by mouth three times a day as needed nausea  Patient Instructions: 1)  If things get worse or you are not feeling better call Friday morning for an appointment on Friday. 2)  Watch your sugars closely to m ake sure they are not too high or too low. Call us if you have questions. 3)  Try the nausea medicine to see if it helps your symptoms.  It will make you sleepy.  Also drink plenty of fluids to help keep yourself hydrated.  Prescriptions: PROMETHAZINE HCL 25 MG TABS (PROMETHAZINE HCL) 1/2 - 1 tab by mouth three times a day as needed nausea  #30 x 1   Entered and Authorized by:   Patria Mane  MD   Signed by:   Patria Mane  MD on 06/04/2009   Method used:   Print then Give to Patient   RxID:   [id number]

## 2010-04-14 NOTE — Progress Notes (Signed)
Summary: Emergency Line Call  Phone Note Call from Patient Call back at [phone]   Caller: Patient Summary of Call: Unilateral pinkeye dx today in office, pt rx'ed macrolide ointment.  Complaining that it is not better yet.  Vision is unchanged from office visit.  Still with watery discharge, itchy, painful, cannot sleep.  No fevers/chills, HA, N/V.  Wondering what to do.  Advised that most pinkeye is viral and these symptoms are typical and can last weeks.  Antibiotics are usually if bacterial etiology suspected.  She should make a workin appt tomo am if she feels necessary but this was natural history of the disease.  Also advised "Homeopathic Pinkeye Remedy" from [facility], combination Belladonna, Euphrasia, Hepar Sulphuris that can provide some symptomatic relief but will not alter course of disease.  Tylenol for pain.  Pt will comply. Initial call taken by: Aundria Mems MD,  June 25, 2009 11:26 PM

## 2010-04-14 NOTE — Progress Notes (Signed)
Summary: meds prob  Phone Note Call from Patient Call back at [phone]   Caller: Patient Summary of Call: pt states that the pharmacy have not rec'd the meds that is supposed to be called into [facility] Initial call taken by: Audie Clear,  February 02, 2010 8:52 AM  Follow-up for Phone Call        spoke with pharmacist and they did receive RX but it is OTC and pharmacist needs to know how much bacteria she is too take so they can give her correct amount OTC. Paged Dr. Tye Savoy and he advises to give 3 billion bacteria twice daily. pharmacist notified. message left on patient's voicemail. Follow-up by: Marcell Barlow RN,  February 02, 2010 11:16 AM

## 2010-04-14 NOTE — Miscellaneous (Signed)
Summary: outside labs  Clinical Lists Changes  Observations: Added new observation of BACTERIA URN: few (07/02/2009 8:35) Added new observation of EPI CELL UR: 0-10 (07/02/2009 8:35) Added new observation of RBCS MICRO U: none seen (07/02/2009 8:35) Added new observation of WBCS MICRO U: 0-5 (07/02/2009 8:35) Added new observation of Cerulean URINE: 5  (07/02/2009 8:35) Added new observation of SPEC GR URIN: 1.025  (07/02/2009 8:35) Added new observation of PROTEIN, URN: negative  (07/02/2009 8:35) Added new observation of KETONES URN: negative  (07/02/2009 8:35) Added new observation of BILIRUBIN UR: negative  (07/02/2009 8:35) Added new observation of OCC BLD UR: negative  (07/02/2009 8:35) Added new observation of BLOOD URINE: negative  (07/02/2009 8:35) Added new observation of GLUCOSE, URN: negative  (07/02/2009 8:35) Added new observation of APPEARANCE U: clear  (07/02/2009 8:35) Added new observation of UA COLOR: yellow  (07/02/2009 8:35) Added new observation of IRON SATUR %: 27 % (07/02/2009 8:35) Added new observation of UIBC: 181 mcg/dL (07/02/2009 8:35) Added new observation of TIBC: 247 mcg/dL (07/02/2009 8:35) Added new observation of IRON: 66 mcg/dL (07/02/2009 8:35) Added new observation of PLATELETS: 195 10*3/mm3 (07/02/2009 8:35) Added new observation of EOSINOPHIL %: 1 % (07/02/2009 8:35) Added new observation of BASOPHIL %: 0 % (07/02/2009 8:35) Added new observation of MONOCYTE %: 4 % (07/02/2009 8:35) Added new observation of LYMPHS %: 19 % (07/02/2009 8:35) Added new observation of PMN %: 76 % (07/02/2009 8:35) Added new observation of RDW: 13.5 % (07/02/2009 8:35) Added new observation of MCV: 92 fL (07/02/2009 8:35) Added new observation of HCT: 32.8 % (07/02/2009 8:35) Added new observation of HGB: 10.5 g/dL (07/02/2009 8:35) Added new observation of WBC: 9.6 10*3/mm3 (07/02/2009 8:35) Added new observation of ALBUMIN: 4.5 g/dL (07/02/2009 8:35) Added new  observation of PO4: 2.7 mg/dL (07/02/2009 8:35) Added new observation of CALCIUM: 8.4 mg/dL (07/02/2009 8:35) Added new observation of BG RANDOM: 384 mg/dL (07/02/2009 8:35) Added new observation of CREATININE: 1.81 mg/dL (07/02/2009 8:35) Added new observation of BUN: 31 mg/dL (07/02/2009 8:35) Added new observation of CO2: 25 mmol/L (07/02/2009 8:35) Added new observation of CHLORIDE: 100 mmol/L (07/02/2009 8:35) Added new observation of POTASSIUM: 5 mmol/L (07/02/2009 8:35) Added new observation of SODIUM: 135 mmol/L (07/02/2009 8:35)

## 2010-04-14 NOTE — Assessment & Plan Note (Signed)
 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  She was also just started on prednisone  10mg  by mouth BID 10/19/07 for concern of graft rejection, and comes in today for high glucose levels.  She was sent in by an outside lab in [place] for high sugars, and was found to have a glucose of >700 in the ED.  Pt states she has been having excessive urination since starting the prednisone  and has had some blurry vision.  Also has been having increased thirst, and increased hunger.  No HA, CP, SOB, N/V/D/C, no dysuria, no fevers.          Review of Systems       As per HPI   Physical Exam  General:     Well-developed,obese,in no acute distress; alert,appropriate and cooperative throughout examination Head:     Normocephalic and atraumatic without obvious abnormalities. No apparent alopecia or balding. Eyes:     No corneal or conjunctival inflammation noted. EOMI. Perrla. Ears:     External ear exam shows no significant lesions or deformities. Nose:     External nasal examination shows no deformity or inflammation. Mouth:     Oral mucosa and oropharynx without lesions or exudates.  Oral mucosa slightly dry. Neck:     No deformities, masses, or tenderness noted. Lungs:     Normal respiratory  effort, chest expands symmetrically. Lungs are clear to auscultation, no crackles or wheezes. Heart:     Normal rate and regular rhythm. S1 and S2 normal without gallop, murmur, click, rub or other extra sounds. Abdomen:     Bowel sounds positive,abdomen soft and non-tender without masses, organomegaly or hernias noted.  Healed scars noted for prior laparotomy, kidney graft, Dialysis graft scars. Extremities:     No clubbing, cyanosis, edema, or deformity noted with normal full range of motion of all joints.   Neurologic:     Grossly normal. Cervical Nodes:     No lymphadenopathy noted Additional Exam:     Labs: BMP: 127/4.7/96/25/42/1.7<  ~700  Urinalysis: negative except for >1000 glucose VBG: 7.347/49.7/16     Impression & Recommendations:  Problem # 1:  GLUCOSE INTOLERANCE (ICD-271.3) The patient has been complaining of symptoms of hyperglycemia since starting prednisone .  As hyperglycemia is a well known side effect of prednisone , I feel that her extremely high sugars today are a result of this.  Curbside consult with pharm also reveals that prograf  (tacrolimus ) can also cause glucose intolerance.  My plan is to bolus her 1000mL NS x2 as I feel she is somewhat dry  with prolonged polyuria, then start 1/2NS + 20KCL @ 250cc/h, then D5 1/2NS + 20meqKCL @ 125cc/h when CBG <250.  I will also start IV insulin /glucostabilizer protocol, CHO modified diet with 1U insulin  coverage for 10g carbs.  I don't want to lower her CBGs to rapidly as rapid decreases in plasma glucose/osmolarity can result in cerebral edema.  I will continue her home med glipizide 10mg  by mouth daily.  Morning BMP.  The patient has a normal bicarb, and normal anion gap so I am not worried about anion gap acidosis/ketoacidosis.    Problem # 2:  HYPONATREMIA (ICD-276.1) This is pseudohyponatremia secondary to the patient's high glucose.  The patient's Na: of 127 is corrected to 141.  Problem # 3:  FEVER UNSPECIFIED  (ICD-780.60) The patient had a tmax of 100.0 degrees oral in the ED.  Although this is not a fever by normal adult standards, this is an immunosuppressed patient, so I will do a CBC, and draw blood cultures.  Her urinalysis was clean so I will not do a urine culture.  I will also continue her Septra  prophylaxis (reg strength by mouth daily)  Problem # 4:  ESSENTIAL HYPERTENSION, BENIGN (ICD-401.1) will continue home med clonidine  0.2mg  by mouth q6h as needed SBP > 165  Problem # 5:  KIDNEY TRANSPLANTATION, HX OF (ICD-V42.0) Will continue prednisone  10mg  by mouth two times a day, Prograf (tacrolimus ) 3mg  by mouth two times a day, Myfortic  360mg  2 tab by mouth two times a day. Will also consult Nephrology.    Complete Medication List: 1)  Myfortic  360 Mg Tbec (Mycophenolate  sodium) 2)  Prograf  0.5 Mg Caps (Tacrolimus ) .... 3 mg bid 3)  Bactrim  Ds 800-160 Mg Tabs (Sulfamethoxazole -trimethoprim ) 4)  Tums 500 Mg Chew (Calcium  carbonate antacid) 5)  Procrit  10000 Unit/ml Soln (Epoetin  alfa) .... 30,000 units q month    ]  Appended Document: Admission H&P R2 Addendum  HPI:  Agree with excellent admission history and physical by Dr. Curtis, above.  Briefly, Ms. Arai is a 25 year old female with PMH significant for 47XXX karyotype, s/p cadaveric kidney transplant who is on chronic immunosuppression.  Has been on prograf  for >1 year, but was recently started on prednisone  earlier this month.  She is followed by the transplant center at [facility] in [place].  A CBG ordered by them today came back elevated so she was referred to the [facility] ED for further evaluation.  In the ED, she complained of several days of polyuria and polydipsia and was noted to be hyperglycemic to >700, with >1000 glucose in her urine.  She was with a normal anion gap, however.  Physical Exam:  Alert and oriented x3, overweight, no acute distress.  Regular rate and rhythm with 2/6 SEM but no rubs or gallops.  Clear to auscultation  bilateral with normal work of breathing.  Soft, nontender, normoactive bowel sounds with no mases or hepatosplenomegaly.  No edema or tenderness in bilateral lower extremities.  Mucous membranes are dry.  A/P:  25 yo female with hyperglycemia, likely secondary to prednisone  and prograf . Hyperglycemia.  Will hold home glipizide.  Start glucose stabilization protocol. Dehydration.  Bolus 2 x 1000 mL NS, then 1/2 NS with 20 KCl at 250 mL/hr. FEN/GI.  CHO-mod diet.  Normokalemic.  Check BMet in AM after fluids. Prophylaxis.  Lovenox . Dispo.  Pending resolution of hyperglycemia.

## 2010-04-14 NOTE — Progress Notes (Signed)
Summary: triage  Phone Note Call from Patient Call back at [phone]   Caller: Patient Summary of Call: Pt has pink eye can she be seen today? Initial call taken by: Raymond Gurney,  June 25, 2009 10:46 AM  Follow-up for Phone Call        LM Follow-up by: Elige Radon RN,  June 25, 2009 10:51 AM  Additional Follow-up for Phone Call Additional follow up Details #1::        Pt returned call Additional Follow-up by: Raymond Gurney,  June 25, 2009 10:53 AM    Additional Follow-up for Phone Call Additional follow up Details #2::    started 3 days. thinks it may be allergies. has been exposed to others who have been sick. wants to be seen. states her mom is on her way now to get her & she will come in. states she can make if befire 11:20 Follow-up by: Elige Radon RN,  June 25, 2009 11:03 AM

## 2010-04-14 NOTE — Consult Note (Signed)
Summary: [facility]   Imported By: Samara Snide 10/03/2009 12:15:55  _____________________________________________________________________  External Attachment:    Type:   Image     Comment:   External Document

## 2010-04-14 NOTE — Assessment & Plan Note (Signed)
Summary: f/u eo   Vital Signs:  Patient profile:   25 year old female Height:      66.25 inches Weight:      205.8 pounds BMI:     33.09 Temp:     98.7 degrees F oral Pulse rate:   80 / minute BP sitting:   121 / 88  (left arm) Cuff size:   regular  Vitals Entered By: Levert Feinstein LPN (June 21, [id number] AM) CC: f/u dm Is Patient Diabetic? Yes Did you bring your meter with you today? No Pain Assessment Patient in pain? no        Primary Care Provider:  Patria Mane  MD  CC:  f/u dm.  History of Present Illness: DM: states she has only used the novolog maybe 2-3 times in total since our last visit for her sugars.  despite this her sugars have ranged from:  AM: 100-275 PM: 158-344  She reports still occasionally forgetting her lantus as well.  She plans to take it at bedtime but occasionally forgets and will take it in the AM instead.    she thinks she may be interested in the insulin pens at some point.    Habits & Providers  Alcohol-Tobacco-Diet     Tobacco Status: never  Current Medications (verified): 1)  Myfortic 360 Mg  Tbec (Mycophenolate Sodium) .... 3 Tablets Two Times A Day 2)  Prograf 1 Mg Caps (Tacrolimus) .... 4 Caps By Mouth Two Times A Day 3)  Tums 500 Mg  Chew (Calcium Carbonate Antacid) .... Prn 4)  Procrit 10000 Unit/ml  Soln (Epoetin Alfa) .... 30,000 Units Q Month 5)  Glucotrol Xl 10 Mg Xr24h-Tab (Glipizide) .Marland Kitchen.. 1 Tab By Mouth Bid 6)  Lantus 100 Unit/ml Soln (Insulin Glargine) .... 50 Units Subcutaneously At Bedtime. Suppply Quant Sufficient For 1 Month.  Disp With Needles, Syringes 7)  Ascensia Breeze 2  Disk (Glucose Blood) .... Test Blood Sugar Four Times Daily. 8)  Bactrim 400-80 Mg Tabs (Sulfamethoxazole-Trimethoprim) .Marland Kitchen.. 1 Tab Daily Per Kidney Doctor. 9)  Prednisone 5 Mg Tabs (Prednisone) .... Unknown Dosage Per Kidney Transplant Doctors 10)  Hydroxyzine Hcl 25 Mg Tabs (Hydroxyzine Hcl) .Marland Kitchen.. 1 By Mouth Two Times A Day As Needed  Itching 11)  Ranitidine Hcl 150 Mg Tabs (Ranitidine Hcl) .Marland Kitchen.. 1 By Mouth Once Daily As Needed 12)  Clonidine Hcl 0.2 Mg Tabs (Clonidine Hcl) .Marland Kitchen.. 1 By Mouth Q6hr As Needed Sbp >165 Per [facility] 13)  B-D Ultra-Fine 30gx0.51ml .... Use As Directed 14)  Promethazine Hcl 25 Mg Tabs (Promethazine Hcl) .... 1/2 - 1 Tab By Mouth Three Times A Day As Needed Nausea 15)  Novolog 100 Unit/ml Soln (Insulin Aspart) .... Take 5 Units At [facility] Meal of The Day. Disp 2 Vials. Disp With Insulin Syringes and Needles For Once Daily Dosing  Allergies (verified): No Known Drug Allergies  Past History:  Past medical, surgical, family and social histories (including risk factors) reviewed for relevance to current acute and chronic problems.  Past Medical History: Reviewed history from 08/26/2009 and no changes required. 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 Multiple AV grafts  Parathyroidectomy - 10/13/2004,  Peritoneal abscss surgery  Kidney Transplant  Family History: Reviewed history from 01/22/2009 and no changes required. mother with HTN  Social History: Reviewed history from 08/26/2009 and no changes required. Lives with mother, attends [employer] (early childhood dev). ; No tobacco, etoh or  drug use. Sexually active.  states she would like to have children but cannot have them naturally due to anatomy  Review of Systems       per HPI  Physical Exam  General:  obese, NAD, pleasant  VS reviewed seems to be in denial about her diabetes control Lungs:  Normal respiratory effort, chest expands symmetrically. Lungs are clear to auscultation, no crackles or wheezes. Heart:  Normal rate and regular rhythm. S1 and S2 normal without gallop, murmur, click, rub or other extra sounds.   Impression & Recommendations:  Problem # 1:  DM (ICD-250.00) Assessment Unchanged  a large limiting factor in better control is her mental slowing.   we discussed taking her novolog EVERY day taking her lantus regularly  we are considering using an insulin pen instead given her intellectual struggles for ease of use.   A1C essentially unchanged but this isn't unexpected given her not using her novolog.  continue to monitor closely.   Her updated medication list for this problem includes:    Glucotrol Xl 10 Mg Xr24h-tab (Glipizide) .Marland Kitchen... 1 tab by mouth bid    Lantus 100 Unit/ml Soln (Insulin glargine) .Marland KitchenMarland KitchenMarland KitchenMarland Kitchen 50 units subcutaneously at bedtime. suppply quant sufficient for 1 month.  disp with needles, syringes    Novolog 100 Unit/ml Soln (Insulin aspart) .Marland Kitchen... Take 5 units at largest meal of the day. disp 2 vials. disp with insulin syringes and needles for once daily dosing  Orders: A1C-[facility] [id number]) [place]- Est Level  3 [id number])  Labs Reviewed: Creat: 1.35 (07/17/2009)    Reviewed HgBA1c results: 9.4 (09/02/2009)  9.0 (05/30/2009)  Complete Medication List: 1)  Myfortic 360 Mg Tbec (Mycophenolate sodium) .... 3 tablets two times a day 2)  Prograf 1 Mg Caps (Tacrolimus) .... 4 caps by mouth two times a day 3)  Tums 500 Mg Chew (Calcium carbonate antacid) .... Prn 4)  Procrit 10000 Unit/ml Soln (Epoetin alfa) .... 30,000 units q month 5)  Glucotrol Xl 10 Mg Xr24h-tab (Glipizide) .Marland Kitchen.. 1 tab by  mouth bid 6)  Lantus 100 Unit/ml Soln (Insulin glargine) .... 50 units subcutaneously at bedtime. suppply quant sufficient for 1 month.  disp with needles, syringes 7)  Ascensia Breeze 2 Disk (Glucose blood) .... Test blood sugar four times daily. 8)  Bactrim 400-80 Mg Tabs (Sulfamethoxazole-trimethoprim) .Marland Kitchen.. 1 tab daily per kidney doctor. 9)  Prednisone 5 Mg Tabs (Prednisone) .... Unknown dosage per kidney transplant doctors 10)  Hydroxyzine Hcl 25 Mg Tabs (Hydroxyzine hcl) .Marland Kitchen.. 1 by mouth two times a day as needed itching 11)  Ranitidine Hcl 150 Mg Tabs (Ranitidine hcl) .Marland Kitchen.. 1 by mouth once daily as needed 12)  Clonidine Hcl 0.2 Mg Tabs (Clonidine hcl) .Marland Kitchen.. 1 by mouth q6hr as needed sbp >165 per [place] [facility] 13)  B-d Ultra-fine 30gx0.63ml  .... Use as directed 14)  Promethazine Hcl 25 Mg Tabs (Promethazine hcl) .... 1/2 - 1 tab by mouth three times a  day as needed nausea 15)  Novolog 100 Unit/ml Soln (Insulin aspart) .... Take 5 units at largest meal of the day. disp 2 vials. disp with insulin syringes and needles for once daily dosing  Patient Instructions: 1)  Your A1C today was 9.4.  We would like this ideally under 7 but would be okay with under 8.  To get there we need to have you take the novolog 5 units with your biggest meal EVERY day!  You can do this!  Keep taking your lantus every day also.  2)  It was nice to see you today.  Please follow up again in 3 months or of course sooner if needed.  Laboratory Results   Blood Tests   Date/Time Received: September 02, 2009 10:09 AM  Date/Time Reported: September 02, 2009 10:21 AM   HGBA1C: 9.4%   (Normal Range: Non-Diabetic - 3-6%   Control Diabetic - 6-8%)  Comments: .............test performed by..............Marland KitchenSchuyler Amor CMA       Prevention & Chronic Care Immunizations   Influenza vaccine: Fluvax Non-MCR  (12/04/2007)   Influenza vaccine due: 12/03/2008    Tetanus booster: 04/09/2008: Tdap   Tetanus booster due:  04/09/2018    Pneumococcal vaccine: Not documented  Other Screening   Pap smear: normal  (09/25/2007)   Pap smear action/deferral: Not indicated-other  (05/30/2009)   Pap smear due: 09/24/2009   Smoking status: never  (09/02/2009)  Diabetes Mellitus   HgbA1C: 9.4  (09/02/2009)   Hemoglobin A1C due: 02/01/2008    Eye exam: Not documented    Foot exam: yes  (05/30/2009)   High risk foot: Not documented   Foot care education: Not documented    Urine microalbumin/creatinine ratio: Not documented    Diabetes flowsheet reviewed?: Yes   Progress toward A1C goal: Unchanged  Hypertension   Last Blood Pressure: 121 / 88  (09/02/2009)   Serum creatinine: 1.35  (07/17/2009)   Serum potassium 5.2  (09/25/2007)    Hypertension flowsheet reviewed?: Yes   Progress toward BP goal: At goal  Self-Management Support :   Personal Goals (by the next clinic visit) :     Personal A1C goal: 8  (11/19/2008)     Personal blood pressure goal: 130/80  (11/19/2008)     Personal LDL goal: 100  (11/19/2008)    Diabetes self-management support: Copy of home glucose meter record, CBG self-monitoring log, Written self-care plan  (09/02/2009)   Diabetes care plan printed    Diabetes self-management support not done because: Good outcomes  (03/11/2009)    Hypertension self-management support: BP self-monitoring log, Written self-care plan  (11/19/2008)    Hypertension self-management support not done because: Good outcomes  (09/02/2009)

## 2010-04-14 NOTE — Consult Note (Signed)
Summary: [facility]   Imported By: Beryle Lathe 06/16/2009 11:41:14  _____________________________________________________________________  External Attachment:    Type:   Image     Comment:   External Document

## 2010-04-14 NOTE — Miscellaneous (Signed)
Summary: stomach pain  Clinical Lists Changes called [place] to say she was on her way here. got an epo shot at short stay & now has stomach pain. stated a few minutes ago. states she is almost here. I put her in the schedule. she has a kidney transplant...Marland KitchenMarland KitchenElige Radon RN  June 12, 2009 3:31 PM

## 2010-04-14 NOTE — Progress Notes (Signed)
Summary: triage  Phone Note Call from Patient Call back at [phone]   Caller: Patient Summary of Call: Pt has been getting her diabetic shots by her brother he no longer will be able to do it and she can't do it herself what can she do?   Initial call taken by: Raymond Gurney,  Aug 04, 2009 2:27 PM  Follow-up for Phone Call        she is going to ask her mom to do it. her brother is going out of town for a week.  she could not think of any other options. states her mom has done it before Follow-up by: Elige Radon RN,  Aug 04, 2009 2:37 PM

## 2010-04-14 NOTE — Assessment & Plan Note (Signed)
Summary: f/u dm,df   Vital Signs:  Patient profile:   25 year old female Height:      66.25 inches Weight:      202 pounds BMI:     32.48 BSA:     2.02 Temp:     98.2 degrees F Pulse rate:   96 / minute BP sitting:   144 / 95  Vitals Entered By: Christen Bame CMA (May 30, 2009 2:16 PM)  Serial Vital Signs/Assessments:  Time      Position  BP       Pulse  Resp  Temp     By                     134/88                         Patria Mane  MD  CC: f/u diabetes, night sweats, breast rash Is Patient Diabetic? No Pain Assessment Patient in pain? no        Primary Care Provider:  Patria Mane  MD  CC:  f/u diabetes, night sweats, and breast rash.  History of Present Illness: diabetes: recognizes it is not under as good of a control. states she is taking her meds as prescribed most of the time.  denies any hypoglycemia.  denies vision changes.  denies urinary frequency or increase in thirst.  thinks her sugars are elevated secondary to diet.  also has been somewhat in denial of her diabetes in todays visit stating she thinks she doesn't really have it and it is all because of the prednisone.   night sweats: continue.  also has noticed some hot flashes.  she thinks it may be related to ranitidine as she notices worsening with it.    breast rash: wound has completely healed on medial left breast but is bothering her because of it's dark color and firmness of the skin.  has been using shea butter on it but without any relief.   Habits & Providers  Alcohol-Tobacco-Diet     Tobacco Status: never  Current Medications (verified): 1)  Myfortic 360 Mg  Tbec (Mycophenolate Sodium) .... 3 Tablets Two Times A Day 2)  Prograf 1 Mg Caps (Tacrolimus) .... 4 Caps By Mouth Two Times A Day 3)  Tums 500 Mg  Chew (Calcium Carbonate Antacid) .... Prn 4)  Procrit 10000 Unit/ml  Soln (Epoetin Alfa) .... 30,000 Units Q Month 5)  Glucotrol Xl 10 Mg Xr24h-Tab (Glipizide) .Marland Kitchen.. 1 Tab By Mouth  Bid 6)  Lantus 100 Unit/ml Soln (Insulin Glargine) .... 50 Units Subcutaneously At Bedtime. Suppply Quant Sufficient For 1 Month.  Disp With Needles, Syringes 7)  Ascensia Breeze 2  Disk (Glucose Blood) .... Test Blood Sugar Four Times Daily. 8)  Bactrim 400-80 Mg Tabs (Sulfamethoxazole-Trimethoprim) .Marland Kitchen.. 1 Tab Daily Per Kidney Doctor. 9)  Prednisone 5 Mg Tabs (Prednisone) .... Unknown Dosage Per Kidney Transplant Doctors 10)  Hydroxyzine Hcl 25 Mg Tabs (Hydroxyzine Hcl) .Marland Kitchen.. 1 By Mouth Two Times A Day As Needed Itching 11)  Ranitidine Hcl 150 Mg Tabs (Ranitidine Hcl) .Marland Kitchen.. 1 By Mouth Once Daily As Needed 12)  Clonidine Hcl 0.2 Mg Tabs (Clonidine Hcl) .Marland Kitchen.. 1 By Mouth Q6hr As Needed Sbp >165 Per [facility] 13)  B-D Ultra-Fine 30gx0.29ml .... Use As Directed  Allergies (verified): No Known Drug Allergies  Past History:  Past medical, surgical, family and social histories (including risk factors) reviewed for relevance to  current acute and chronic problems.  Past Medical History: Reviewed history from 11/01/2007 and no changes required. 28, XXX, absent uterus, right ovary, small left ovary, FSGS, h/o enuresis, s/p deceased donor kidney transplant in 4/07 from a donor with an underlying malignancy- followed in [place] procrit Glucose intolerance s/p transplant, now diabetic in 10/2007  Past Surgical History: Reviewed history from 05/12/2006 and no changes required. CT - Abdominal 10/00 -, CT - Pelvic 10/00 -, Karyotype 9/00 -, Multiple AV grafts (most recent 12/06) - 05/11/2005, Parathyroidectomy - 10/13/2004, Peritoneal abscss surgery -  Review of Systems       per HPI.    Physical Exam  General:  Well-developed,obese,in no acute distress; alert,appropriate and cooperative throughout examination VS reviewed.  repeat BP 134/88 Lungs:  Normal respiratory effort, chest expands symmetrically. Lungs are clear to auscultation, no crackles or wheezes. Heart:  Normal rate and  regular rhythm. S1 and S2 normal without gallop, murmur, click, rub or other extra sounds. Skin:  noted area of scar from breast wound on medial left breast.  hyperpigemnted and firm scar tissue.  not fixed.    Diabetes Management Exam:    Foot Exam (with socks and/or shoes not present):       Sensory-Pinprick/Light touch:          Left medial foot (L-4): normal          Left dorsal foot (L-5): normal          Left lateral foot (S-1): normal          Right medial foot (L-4): normal          Right dorsal foot (L-5): normal          Right lateral foot (S-1): normal       Sensory-Monofilament:          Left foot: normal          Right foot: normal       Inspection:          Left foot: normal          Right foot: normal       Nails:          Left foot: normal          Right foot: normal   Impression & Recommendations:  Problem # 1:  DM (ICD-250.00) Assessment Deteriorated is aware that her control is less than optimal.  states she is in denial that she even has diabetes.    encouraged exercise, taking meds as prescribed, continuing to write down sugars for monitoring.  encouraged her to consider increasing her lantus to 55 units daily to see how sugar control is.  she states she will "think about it".    continue routine follow up, monitoring.  Her updated medication list for this problem includes:    Glucotrol Xl 10 Mg Xr24h-tab (Glipizide) .Marland Kitchen... 1 tab by mouth bid    Lantus 100 Unit/ml Soln (Insulin glargine) .Marland KitchenMarland KitchenMarland KitchenMarland Kitchen 50 units subcutaneously at bedtime. suppply quant sufficient for 1 month.  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  had to leave though 2/2 ride needing to go so did not get to finish discussion about possible hormone replacement therapy - at least for until she is 25 years old for bone health as well as for symptoms.  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  will discuss further at next visit. 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  also told it may never disappear unfortunately.    Orders: [place]- Est  Level 4 (99214)  Complete Medication List: 1)  Myfortic 360 Mg Tbec (Mycophenolate sodium) .... 3 tablets two times a day 2)  Prograf 1 Mg Caps (Tacrolimus) .... 4 caps by mouth two times a day 3)  Tums 500 Mg Chew (Calcium carbonate antacid) .... Prn 4)  Procrit 10000 Unit/ml Soln (Epoetin alfa) .... 30,000 units q month 5)  Glucotrol Xl 10 Mg Xr24h-tab (Glipizide) .Marland Kitchen.. 1 tab by mouth bid 6)  Lantus 100 Unit/ml Soln (Insulin glargine) .... 50 units subcutaneously at bedtime. suppply quant sufficient for 1 month.  disp with needles, syringes 7)  Ascensia Breeze 2 Disk (Glucose blood) .... Test blood sugar four times daily. 8)  Bactrim 400-80 Mg Tabs (Sulfamethoxazole-trimethoprim) .Marland Kitchen.. 1 tab daily per kidney doctor. 9)  Prednisone 5 Mg Tabs (Prednisone) .... Unknown dosage per kidney transplant doctors 10)  Hydroxyzine Hcl 25 Mg Tabs (Hydroxyzine hcl) .Marland Kitchen.. 1 by mouth two times a day as needed itching 11)  Ranitidine Hcl 150 Mg Tabs (Ranitidine hcl) .Marland Kitchen.. 1 by mouth once daily as needed 12)  Clonidine Hcl 0.2 Mg Tabs (Clonidine hcl) .Marland Kitchen.. 1 by mouth q6hr as needed sbp >165 per [place] [facility] 13)  B-d Ultra-fine 30gx0.33ml  .... Use as directed  Patient Instructions: 1)  Please follow up in 3 months or sooner if needed. 2)  As you know your diabetes was not under as good of control as I would have liked.  Your A1C number was 9.  Ideally we would like this to be less than 7.  Watching what you eat, taking your medicines as prescribed and exercising all help this to get better.  These also keep you from having  complications from your diabetes.  If your numbers aren't better at your next visit we will need to increase your insulin some.   3)  The other thing we talked about today is that your labs show that you are going through some early menopause.  It is nothing dangerous but is the reason for your hot flashes and night sweats.   Prescriptions: B-D ULTRA-FINE 30GX0.5ML use as directed  #100 x 11   Entered and Authorized by:   Patria Mane  MD   Signed by:   Patria Mane  MD on 05/30/2009   Method used:   Print then Give to Patient   RxID:   [id number]   Laboratory Results   Blood Tests   Date/Time Received: May 30, 2009 2:10 PM  Date/Time Reported: May 30, 2009 2:20 PM   HGBA1C: 9.0%   (Normal Range: Non-Diabetic - 3-6%   Control Diabetic - 6-8%)  Comments: ...............test performed by......Marland KitchenBonnie A. 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  Diabetes Mellitus   HgbA1C: 9.0  (05/30/2009)   Hemoglobin A1C due: 02/01/2008    Eye exam: Not documented    Foot exam: yes  (05/30/2009)   High risk foot: Not documented   Foot care education: Not documented    Urine microalbumin/creatinine ratio: Not documented    Diabetes flowsheet reviewed?: Yes   Progress toward A1C goal: Deteriorated  Hypertension   Last Blood Pressure: 144 / 95  (05/30/2009)   Serum creatinine: 1.31  (10/24/2008)   Serum potassium 5.2  (09/25/2007)    Hypertension flowsheet reviewed?: Yes   Progress toward BP  goal: At goal  Self-Management Support :   Personal Goals (by the next clinic visit) :     Personal A1C goal: 8  (11/19/2008)     Personal blood pressure goal: 130/80  (11/19/2008)     Personal LDL goal: 100  (11/19/2008)    Diabetes self-management support: Copy of home glucose meter record, CBG self-monitoring log, Written self-care plan  (05/30/2009)   Diabetes care plan printed    Diabetes self-management support not done because: Good outcomes  (03/11/2009)    Hypertension self-management support: BP self-monitoring log, Written self-care plan  (11/19/2008)    Hypertension self-management support not done because: Good outcomes  (05/30/2009)

## 2010-04-14 NOTE — Assessment & Plan Note (Signed)
Summary: f/up,tcb   Vital Signs:  Patient profile:   25 year old female Height:      66.25 inches Weight:      206.6 pounds BMI:     33.21 Temp:     98.1 degrees F oral Pulse rate:   92 / minute BP sitting:   134 / 100  (right arm) Cuff size:   regular  Vitals Entered By: Levert Feinstein LPN (May 20, [id number] PM) CC: f/u diabetes Is Patient Diabetic? Yes Did you bring your meter with you today? No Pain Assessment Patient in pain? no        Primary Care Provider:  Patria Mane  MD  CC:  f/u diabetes.  History of Present Illness: diabetes: here for follow up.  has been having some very high sugars recently.  lowest noted in her log was 119 and highest 485.  as of the last month her sugars have all been at least in the mid 200s.  she states she has been taking her lantus anywhere between 60 units and 80 units daily.  she does note she hasn't been eating well and is thinking about changing to a vegeterian diet to see if this helps her diabetes, helps her lose weight and hopefully "cure her" of diabetes.  she did also state that her renal doctor thought i might discuss with her today starting another type of insulin.   Habits & Providers  Alcohol-Tobacco-Diet     Tobacco Status: never  Current Medications (verified): 1)  Myfortic 360 Mg  Tbec (Mycophenolate Sodium) .... 3 Tablets Two Times A Day 2)  Prograf 1 Mg Caps (Tacrolimus) .... 4 Caps By Mouth Two Times A Day 3)  Tums 500 Mg  Chew (Calcium Carbonate Antacid) .... Prn 4)  Procrit 10000 Unit/ml  Soln (Epoetin Alfa) .... 30,000 Units Q Month 5)  Glucotrol Xl 10 Mg Xr24h-Tab (Glipizide) .Marland Kitchen.. 1 Tab By Mouth Bid 6)  Lantus 100 Unit/ml Soln (Insulin Glargine) .... 50 Units Subcutaneously At Bedtime. Suppply Quant Sufficient For 1 Month.  Disp With Needles, Syringes 7)  Ascensia Breeze 2  Disk (Glucose Blood) .... Test Blood Sugar Four Times Daily. 8)  Bactrim 400-80 Mg Tabs (Sulfamethoxazole-Trimethoprim) .Marland Kitchen.. 1 Tab Daily Per  Kidney Doctor. 9)  Prednisone 5 Mg Tabs (Prednisone) .... Unknown Dosage Per Kidney Transplant Doctors 10)  Hydroxyzine Hcl 25 Mg Tabs (Hydroxyzine Hcl) .Marland Kitchen.. 1 By Mouth Two Times A Day As Needed Itching 11)  Ranitidine Hcl 150 Mg Tabs (Ranitidine Hcl) .Marland Kitchen.. 1 By Mouth Once Daily As Needed 12)  Clonidine Hcl 0.2 Mg Tabs (Clonidine Hcl) .Marland Kitchen.. 1 By Mouth Q6hr As Needed Sbp >165 Per [facility] 13)  B-D Ultra-Fine 30gx0.81ml .... Use As Directed 14)  Promethazine Hcl 25 Mg Tabs (Promethazine Hcl) .... 1/2 - 1 Tab By Mouth Three Times A Day As Needed Nausea 15)  Novolog 100 Unit/ml Soln (Insulin Aspart) .... Take 5 Units At [facility] Meal of The Day. Disp 2 Vials. Disp With Insulin Syringes and Needles For Once Daily Dosing  Allergies (verified): No Known Drug Allergies  Past History:  Past medical, surgical, family and social histories (including risk factors) reviewed for relevance to current acute and chronic problems.  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 CT - Abdominal 10/00 -, CT - Pelvic 10/00 -, Karyotype 9/00 -, Multiple AV grafts (most recent 12/06) - 05/11/2005, Parathyroidectomy - 10/13/2004, Peritoneal abscss surgery -  Family History: Reviewed history from 01/22/2009 and no changes required. mother with HTN  Social History: Reviewed history from 09/25/2007 and no changes required. Lives with mother, attends [employer] (early childhood dev). ; No tobacco, etoh or drug use. Sexually active.  Review of Systems       per HPI  Physical  Exam  General:  obese, NAD, pleasant  VS reviewed seems to be in denial about her diabetes control Lungs:  Normal respiratory effort, chest expands symmetrically. Lungs are clear to auscultation, no crackles or wheezes. Heart:  Normal rate and regular rhythm. S1 and S2 normal without gallop, murmur, click, rub or other extra sounds.   Impression & Recommendations:  Problem # 1:  DM (ICD-250.00) Assessment Deteriorated  still in denial about her diabetes and it's control.  unsure how much lantus she is really taking as she in the past has often told me she doesn't think she needs it.  discussed that perhaps changing her diet may be helpful but she is concerned that finances will limit her choices. discussed how i would like to add novolog insulin 5 units to her biggest meal of the day.  she thinks she can do this so we will start with this for now and likely titrate up.  f/u 1 month  Her updated medication list for this problem includes:    Glucotrol Xl 10 Mg Xr24h-tab (Glipizide) .Marland Kitchen... 1 tab by mouth bid    Lantus 100 Unit/ml Soln (Insulin glargine) .Marland KitchenMarland KitchenMarland KitchenMarland Kitchen 50 units subcutaneously at bedtime. suppply quant sufficient for 1 month.  disp with needles, syringes    Novolog 100 Unit/ml Soln (Insulin aspart) .Marland Kitchen... Take 5 units at largest meal of the day. disp 2 vials. disp with insulin syringes and needles for once daily dosing  Orders: [place]- Est Level  3 [id number])  Complete Medication List: 1)  Myfortic 360 Mg Tbec (Mycophenolate sodium) .... 3 tablets two times a day 2)  Prograf 1 Mg Caps (Tacrolimus) .... 4 caps by mouth two times a day 3)  Tums 500 Mg Chew (Calcium carbonate antacid) .... Prn 4)  Procrit 10000 Unit/ml Soln (Epoetin alfa) .... 30,000 units q month 5)  Glucotrol Xl 10 Mg Xr24h-tab (Glipizide) .Marland Kitchen.. 1 tab by mouth bid 6)  Lantus 100 Unit/ml Soln (Insulin glargine) .... 50 units subcutaneously at bedtime. suppply quant sufficient for 1 month.  disp with needles, syringes 7)   Ascensia Breeze 2 Disk (Glucose blood) .... Test blood sugar four times daily. 8)  Bactrim 400-80 Mg Tabs (Sulfamethoxazole-trimethoprim) .Marland Kitchen.. 1 tab daily per kidney doctor. 9)  Prednisone 5 Mg Tabs (Prednisone) .... Unknown dosage per kidney transplant doctors 10)  Hydroxyzine Hcl 25 Mg Tabs (Hydroxyzine hcl) .Marland Kitchen.. 1 by mouth two times a day as needed itching 11)  Ranitidine Hcl 150 Mg Tabs (Ranitidine hcl) .Marland Kitchen.. 1 by mouth once daily as needed 12)  Clonidine Hcl 0.2 Mg Tabs (Clonidine hcl) .Marland Kitchen.. 1 by mouth q6hr as needed sbp >165 per [facility] 13)  B-d Ultra-fine 30gx0.5ml  .... Use as directed 14)  Promethazine Hcl 25 Mg Tabs (Promethazine hcl) .... 1/2 - 1 tab by mouth three times a day as needed nausea 15)  Novolog  100 Unit/ml Soln (Insulin aspart) .... Take 5 units at largest meal of the day. disp 2 vials. disp with insulin syringes and needles for once daily dosing  Patient Instructions: 1)  Please follow up in 1 month. 2)  Start taking 5 units of novolog insulin with your largest meal of the day. 3)  Look at places like [employer] for a book on [employer].   4)  If you have questions please feel free to call. Prescriptions: NOVOLOG 100 UNIT/ML SOLN (INSULIN ASPART) take 5 units at largest meal of the day. Disp 2 vials. disp with insulin syringes and needles for once daily dosing  #2 x 6   Entered and Authorized by:   Patria Mane  MD   Signed by:   Patria Mane  MD on 08/01/2009   Method used:   Print then Give to Patient   RxID:   [id number]

## 2010-04-14 NOTE — Assessment & Plan Note (Signed)
Summary: stomach pain/Alva/alm   Vital Signs:  Patient profile:   25 year old female Height:      66.25 inches Weight:      206 pounds BMI:     33.12 BSA:     2.03 Temp:     97.8 degrees F Pulse rate:   79 / minute BP sitting:   91 / 55  Vitals Entered By: Christen Bame CMA (June 12, 2009 4:08 PM) CC: STOMACH ISSUES Pain Assessment Patient in pain? no        Primary Care Provider:  Patria Mane  MD  CC:  STOMACH ISSUES.  History of Present Illness: 1) Stomach "sensation" and lightheadednes: Reports that she has has a "funny feeling" in her left abdomen since getting her Epogen shot this AM around 10:45. Reports that she has an issue with the nurse who gave her Epogen shot and wonders if she gave her something else, wants [place] to "check her blood to make sure". Reports some lightheadness earlier today which is resolved. Ate one meal today - steak biscuit from [employer] this morning. Drank orange juice and apple juice this morning, one 8 ounce bottle of water as well. Denies chest pain, dysuria, hematuria nausea, emesis, diarrhea, abdominal pain - "it just feels funny", itching, dyspnea. Initial BP is 91/55. Takes her Lantus at night, is on clonidine as needed BP > 165 but did not take this today.   Current Medications (verified): 1)  Myfortic 360 Mg  Tbec (Mycophenolate Sodium) .... 3 Tablets Two Times A Day 2)  Prograf 1 Mg Caps (Tacrolimus) .... 4 Caps By Mouth Two Times A Day 3)  Tums 500 Mg  Chew (Calcium Carbonate Antacid) .... Prn 4)  Procrit 10000 Unit/ml  Soln (Epoetin Alfa) .... 30,000 Units Q Month 5)  Glucotrol Xl 10 Mg Xr24h-Tab (Glipizide) .Marland Kitchen.. 1 Tab By Mouth Bid 6)  Lantus 100 Unit/ml Soln (Insulin Glargine) .... 50 Units Subcutaneously At Bedtime. Suppply Quant Sufficient For 1 Month.  Disp With Needles, Syringes 7)  Ascensia Breeze 2  Disk (Glucose Blood) .... Test Blood Sugar Four Times Daily. 8)  Bactrim 400-80 Mg Tabs (Sulfamethoxazole-Trimethoprim) .Marland Kitchen.. 1 Tab  Daily Per Kidney Doctor. 9)  Prednisone 5 Mg Tabs (Prednisone) .... Unknown Dosage Per Kidney Transplant Doctors 10)  Hydroxyzine Hcl 25 Mg Tabs (Hydroxyzine Hcl) .Marland Kitchen.. 1 By Mouth Two Times A Day As Needed Itching 11)  Ranitidine Hcl 150 Mg Tabs (Ranitidine Hcl) .Marland Kitchen.. 1 By Mouth Once Daily As Needed 12)  Clonidine Hcl 0.2 Mg Tabs (Clonidine Hcl) .Marland Kitchen.. 1 By Mouth Q6hr As Needed Sbp >165 Per [facility] 13)  B-D Ultra-Fine 30gx0.71ml .... Use As Directed 14)  Promethazine Hcl 25 Mg Tabs (Promethazine Hcl) .... 1/2 - 1 Tab By Mouth Three Times A Day As Needed Nausea  Allergies (verified): No Known Drug Allergies  Past History:  Past Medical History: Reviewed history from 11/01/2007 and no changes required. 51, XXX, absent uterus, right ovary, small left ovary, FSGS, h/o enuresis, s/p deceased donor kidney transplant in 4/07 from a donor with an underlying malignancy- followed in [place] procrit Glucose intolerance s/p transplant, now diabetic in 10/2007  Physical Exam  General:  obese, NAD, pleasant  Repeat BP 112/82 Mouth:  moist membranes  Lungs:  Normal respiratory effort, chest expands symmetrically. Lungs are clear to auscultation, no crackles or wheezes. Heart:  Normal rate and regular rhythm. S1 and S2 normal without gallop, murmur, click, rub or other extra sounds. Abdomen:  surgical scars noted. mild tender LUQ. no rebound or guarding. +BS  Pulses:  2+ radials  Extremities:  well perfused.  Neurologic:  alert, somewhat slow to respond to questions (appears to be baseline per nursing staff)  Skin:  good turgor    Impression & Recommendations:  Problem # 1:  DIZZINESS (ICD-780.4) Assessment New  Symptoms likely secondary to poor oral intake today. Advised on importance of regular meals, healthier food choices, staying well hydrated (especially given history of kidney transplant). Rechecked blood pressure improved. Advised return to follow up with PCP.    Orders: [place]- Est Level  3 [id number])  Complete Medication List: 1)  Myfortic 360 Mg Tbec (Mycophenolate sodium) .... 3 tablets two times a day 2)  Prograf 1 Mg Caps (Tacrolimus) .... 4 caps by mouth two times a day 3)  Tums 500 Mg Chew (Calcium carbonate antacid) .... Prn 4)  Procrit 10000 Unit/ml Soln (Epoetin alfa) .... 30,000 units q month 5)  Glucotrol Xl 10 Mg Xr24h-tab (Glipizide) .Marland Kitchen.. 1 tab by mouth bid 6)  Lantus 100 Unit/ml Soln (Insulin glargine) .... 50 units subcutaneously at bedtime. suppply quant sufficient for 1 month.  disp with needles, syringes 7)  Ascensia Breeze 2 Disk (Glucose blood) .... Test blood sugar four times daily. 8)  Bactrim 400-80 Mg Tabs (Sulfamethoxazole-trimethoprim) .Marland Kitchen.. 1 tab daily per kidney doctor. 9)  Prednisone 5 Mg Tabs (Prednisone) .... Unknown dosage per kidney transplant doctors 10)  Hydroxyzine Hcl 25 Mg Tabs (Hydroxyzine hcl) .Marland Kitchen.. 1 by mouth two times a day as needed itching 11)  Ranitidine Hcl 150 Mg Tabs (Ranitidine hcl) .Marland Kitchen.. 1 by mouth once daily as needed 12)  Clonidine Hcl 0.2 Mg Tabs (Clonidine hcl) .Marland Kitchen.. 1 by mouth q6hr as needed sbp >165 per [facility] 13)  B-d Ultra-fine 30gx0.4ml  .... Use as directed 14)  Promethazine Hcl 25 Mg Tabs (Promethazine hcl) .... 1/2 - 1 tab by mouth three times a day as needed nausea

## 2010-04-16 NOTE — Progress Notes (Signed)
Summary: Phn Msg  Phone Note Call from Patient Call back at [phone]   Reason for Call: Talk to Nurse Summary of Call: pt needs [place] to fax her lab work to [facility] so her doctor there can review.  Initial call taken by: Samara Snide,  April 09, 2010 9:15 AM  Follow-up for Phone Call        done Follow-up by: Christen Bame CMA,  April 09, 2010 9:45 AM

## 2010-04-16 NOTE — Assessment & Plan Note (Addendum)
Summary: F/U Diabetes - Rx Clinic   Vital Signs:  Patient profile:   25 year old female Weight:      196 pounds Pulse rate:   76 / minute BP sitting:   135 / 94  (right arm)  Primary Care Provider:  Cletus Gash MD   History of Present Illness: Patient arrives in good spirits.  She reports using her Lantus at varying times of the day and using Novolog once a day with her dinner.  Patient does report awakening at night several times with cold/hot flashes and tingling fingers due to low blood sugars.  Fasting blood sugars range from 150-200 and at night her sugars run around 150-200 as well.  She is very committed to increasing her exercise.  Right now patient reports walking around stores for a couple hours everyday.  Her sister has been encouraging her to join the [facility]. She is open to using more meal time insulin injections as long as she has a schedule written out.    Current Medications (verified): 1)  Myfortic 360 Mg  Tbec (Mycophenolate Sodium) .... 3 Tablets Two Times A Day 2)  Prograf 1 Mg Caps (Tacrolimus) .... 2 Caps By Mouth in Am and 3 Caps At Night. 3)  Tums 500 Mg  Chew (Calcium Carbonate Antacid) .... 2 Daily 4)  Procrit 10000 Unit/ml  Soln (Epoetin Alfa) .... 30,000 Units Twice Monthly - At Short-Stay 5)  Lantus 100 Unit/ml Soln (Insulin Glargine) .... 60 Units Subcutaneously Each Morning. Suppply Quant Sufficient For 1 Month.  Disp With Needles, Syringes 6)  Ascensia Breeze 2  Disk (Glucose Blood) .... Test Blood Sugar Four Times Daily. 7)  Bactrim 400-80 Mg Tabs (Sulfamethoxazole-Trimethoprim) .Marland Kitchen.. 1 Tab Daily Per Kidney Doctor. 8)  Prednisone 5 Mg Tabs (Prednisone) .Marland Kitchen.. 1 At Bedtime 9)  Hydroxyzine Hcl 25 Mg Tabs (Hydroxyzine Hcl) .Marland Kitchen.. 1 By Mouth Two Times A Day As Needed Itching 10)  Ranitidine Hcl 150 Mg Tabs (Ranitidine Hcl) .Marland Kitchen.. 1 By Mouth Once Daily As Needed 11)  B-D Ultra-Fine 30gx0.55ml .... Use As Directed Please Give Short Needles, and B-D Name Brand. 12)   Novolog 100 Unit/ml Soln (Insulin Aspart) .... Take 10 Units Prior To Evening Meal. Disp 2 Vials. Disp With Insulin Syringes 13)  Simvastatin 20 Mg Tabs (Simvastatin) .... One By Mouth At Bedtime 14)  Lisinopril 10 Mg Tabs (Lisinopril) .... Take One By Mouth Daily 15)  Bd Insulin Syringe Ultrafine 30g X 1/2" 0.5 Ml Misc (Insulin Syringe-Needle U-100) .... Supply Quantity For Three Shots Per Day Dosing. Dispense At Least One Box.  Allergies: 1)  ! Diphenhydramine Hcl (Diphenhydramine Hcl) 2)  Infed (Iron Dextran)   Impression & Recommendations:  Problem # 1:  DM (ICD-250.00) Diabetes currently under: slightly improved control of blood glucose control based on A1C of: 9.6 BUT only has had improved numbers of <250 for the last month.  Fasting CBGs of 80-200  and random during the day readings of 100-220.  Control is suboptimal due to: minimal exercise, willing   Denies hypoglycemic events.  Able to verbalize appropriate hypoglycemia management plan.  Counseled patient about healthy eating patterns, portion control, and avoiding fried foods.  Reports adherence with: all medications.   Adjusted Novolog rapid insulin to:8 units prior to breakfast, 8 units prior to lunch AND 15 units prior to dinner.   Patient plans to join a gym by the end of January.  Written pt instructions provided:  F/U Rx Clinic Visit:4-5 weeks.  TTFFC: 40  mins.  Pt seen with: Mliss Sax, PharmD student AND Mammie Lorenzo, PharmD resident.   Her updated medication list for this problem includes:    Lantus 100 Unit/ml Soln (Insulin glargine) .Marland KitchenMarland KitchenMarland KitchenMarland Kitchen 60 units subcutaneously each morning. suppply quant sufficient for 1 month.  disp with needles, syringes    Novolog 100 Unit/ml Soln (Insulin aspart) .Marland Kitchen... Take 8 units prior to breakfast and lunch and then 15 units prior to evening meal. dispense quanity sufficient. disp with insulin syringes    Lisinopril 10 Mg Tabs (Lisinopril) .Marland Kitchen... Take one by mouth daily  Orders: Basic  Met-[facility] [id number]) A1C-[facility] [id number]) Reassessment Each 15 min unit- [place] [id number])  Problem # 2:  ESSENTIAL HYPERTENSION, BENIGN (ICD-401.1) Blood pressure is slightly above goal today at 135/94.  Per patients blood pressure log this is higher than normal.  This is also being followed by [facility]. Her updated medication list for this problem includes:    Lisinopril 10 Mg Tabs (Lisinopril) .Marland Kitchen... Take one by mouth daily  Orders: Basic Met-[facility] [id number])  Complete Medication List: 1)  Myfortic 360 Mg Tbec (Mycophenolate sodium) .... 3 tablets two times a day 2)  Prograf 1 Mg Caps (Tacrolimus) .... 2 caps by mouth in am and 3 caps at night. 3)  Tums 500 Mg Chew (Calcium carbonate antacid) .... 2 daily 4)  Procrit 10000 Unit/ml Soln (Epoetin alfa) .... 30,000 units twice monthly - at short-stay 5)  Lantus 100 Unit/ml Soln (Insulin glargine) .... 60 units subcutaneously each morning. suppply quant sufficient for 1 month.  disp with needles, syringes 6)  Ascensia Breeze 2 Disk (Glucose blood) .... Test blood sugar four times daily. 7)  Bactrim 400-80 Mg Tabs (Sulfamethoxazole-trimethoprim) .Marland Kitchen.. 1 tab daily per kidney doctor. 8)  Prednisone 5 Mg Tabs (Prednisone) .Marland Kitchen.. 1 at bedtime 9)  Hydroxyzine Hcl 25 Mg Tabs (Hydroxyzine hcl) .Marland Kitchen.. 1 by mouth two times a day as needed itching 10)  Ranitidine Hcl 150 Mg Tabs (Ranitidine hcl) .Marland Kitchen.. 1 by mouth once daily as needed 11)  B-d Ultra-fine 30gx0.25ml  .... Use as directed please give short needles, and b-d name brand. 12)  Novolog 100 Unit/ml Soln (Insulin aspart) .... Take 8 units prior to breakfast and lunch and then 15 units prior to evening meal. dispense quanity sufficient. disp with insulin syringes 13)  Simvastatin 20 Mg Tabs (Simvastatin) .... One by mouth at bedtime 14)  Lisinopril 10 Mg Tabs (Lisinopril) .... Take one by mouth daily 15)  Bd Insulin Syringe Ultrafine 30g X 1/2" 0.5 Ml Misc (Insulin syringe-needle u-100) .... Supply  quantity for three shots per day dosing. dispense at least one box.  Patient Instructions: 1)  Lantus 50 units every morning in your belly. 2)  Novolog - take 8 units before breakfast, 8 units before lunch, and 15 units before dinner. 3)  Continue to check your blood sugar twice a day.  Try to check it every morning and then 2 hours after a meal (try to vary which meal this is after) 4)  Look into the [employer] programs - [employer] sign up begins January 28th.  5)  Follow Up visit with Rx Clinic in 4-5 weeks.  Prescriptions: TUMS 500 MG  CHEW (CALCIUM CARBONATE ANTACID) 2 daily  #1 x 0   Entered and Authorized by:   Rinaldo Ratel D   Signed by:   Janeann Forehand Pharm D on 04/03/2010   Method used:   Historical   [id number] LANTUS 100 UNIT/ML SOLN (INSULIN  GLARGINE) 60 units subcutaneously each morning. suppply quant sufficient for 1 month.  Disp with needles, syringes  #1 x 0   Entered and Authorized by:   Rinaldo Ratel D   Signed by:   Janeann Forehand Pharm D on 04/03/2010   Method used:   Historical   RxID:   [id number]    Orders Added: 1)  Basic Met-[facility] [id number] 2)  A1C-[facility] [83036] 3)  Reassessment Each 15 min unit- [facility] [id number]    Prevention & Chronic Care Immunizations   Influenza vaccine: Fluvax Non-MCR  (12/04/2007)   Influenza vaccine due: 12/03/2008    Tetanus booster: 04/09/2008: Tdap   Tetanus booster due: 04/09/2018    Pneumococcal vaccine: Not documented  Other Screening   Pap smear: normal  (09/25/2007)   Pap smear action/deferral: Not indicated-other  (05/30/2009)   Pap smear due: 09/24/2009   Smoking status: never  (02/11/2010)  Diabetes Mellitus   HgbA1C: 10.3  (12/12/2009)   Hemoglobin A1C due: 02/01/2008    Eye exam: Not documented    Foot exam: yes  (05/30/2009)   High risk foot: Not documented   Foot care education: Not documented    Urine microalbumin/creatinine ratio: Not documented    Diabetes flowsheet  reviewed?: Yes  Hypertension   Last Blood Pressure: 135 / 94  (04/03/2010)   Serum creatinine: 1.51  (01/29/2010)   Serum potassium 4.3  (01/29/2010)    Hypertension flowsheet reviewed?: Yes   Progress toward BP goal: Unchanged  Self-Management Support :   Personal Goals (by the next clinic visit) :     Personal A1C goal: 8  (11/19/2008)     Personal blood pressure goal: 130/80  (11/19/2008)     Personal LDL goal: 100  (11/19/2008)    Diabetes self-management support: Copy of home glucose meter record, CBG self-monitoring log, Written self-care plan  (09/02/2009)    Diabetes self-management support not done because: Good outcomes  (03/11/2009)    Hypertension self-management support: BP self-monitoring log, Written self-care plan  (11/19/2008)    Hypertension self-management support not done because: Good outcomes  (09/02/2009)  Appended Document: A1c  9.6 %    Lab Visit  Laboratory Results   Blood Tests   Date/Time Received: April 03, 2010 11:02 AM  Date/Time Reported: April 03, 2010 12:06 PM   HGBA1C: 9.6%   (Normal Range: Non-Diabetic - 3-6%   Control Diabetic - 6-8%)  Comments: ...............test performed by......Marland KitchenBonnie A. Martinique, MLS (ASCP)cm    Orders Today:

## 2010-04-16 NOTE — Assessment & Plan Note (Signed)
Summary: Diabetes - Insulin - Rx Clinic   Vital Signs:  Patient profile:   25 year old female Weight:      196 pounds BMI:     32.48 Pulse rate:   90 / minute BP sitting:   129 / 87  (left arm)  Primary Care Provider:  Cletus Gash MD   History of Present Illness: Patient arrives in good spirits.  She brought al medications today HOwever did NOT bring in her blood glucose log.    She reports Blood gludose fasting level of 230 and the remainder of the day 300-360.     She reports taking 50 units of insulin (Lantus) at bedtime.   She is also taking 5 units of Novolog once daily.   She is taking glucotrol XL but we discussed stopping that medication if she is willing to increase use of Novolog.  She admits to eating two meals per day however she also states that she does NOT think 2 pieces of pizza is a meal.   She would like to stop taking injections of insulin in the future.   Discussed weight loss goal - she would like to be 140 pounds.     Current Medications (verified): 1)  Myfortic 360 Mg  Tbec (Mycophenolate Sodium) .... 3 Tablets Two Times A Day 2)  Prograf 1 Mg Caps (Tacrolimus) .... 2 Caps By Mouth in Am and 3 Caps At Night. 3)  Tums 500 Mg  Chew (Calcium Carbonate Antacid) .... 2-3 Daily Taken At Once As Needed 4)  Procrit 10000 Unit/ml  Soln (Epoetin Alfa) .... 30,000 Units Twice Monthly - At Short-Stay 5)  Glucotrol Xl 10 Mg Xr24h-Tab (Glipizide) .Marland Kitchen.. 1 Tab By Mouth Bid 6)  Lantus 100 Unit/ml Soln (Insulin Glargine) .... 50 Units Subcutaneously At Bedtime. Suppply Quant Sufficient For 1 Month.  Disp With Needles, Syringes 7)  Ascensia Breeze 2  Disk (Glucose Blood) .... Test Blood Sugar Four Times Daily. 8)  Bactrim 400-80 Mg Tabs (Sulfamethoxazole-Trimethoprim) .Marland Kitchen.. 1 Tab Daily Per Kidney Doctor. 9)  Prednisone 5 Mg Tabs (Prednisone) .Marland Kitchen.. 1 At Bedtime 10)  Hydroxyzine Hcl 25 Mg Tabs (Hydroxyzine Hcl) .Marland Kitchen.. 1 By Mouth Two Times A Day As Needed Itching 11)   Ranitidine Hcl 150 Mg Tabs (Ranitidine Hcl) .Marland Kitchen.. 1 By Mouth Once Daily As Needed 12)  B-D Ultra-Fine 30gx0.25ml .... Use As Directed Please Give Short Needles, and B-D Name Brand. 13)  Novolog 100 Unit/ml Soln (Insulin Aspart) .... Take 5 Units At Bed Time. Disp 2 Vials. Disp With Insulin Syringes and Needles For Once Daily Dosing 14)  Simvastatin 20 Mg Tabs (Simvastatin) .... One By Mouth At Bedtime 15)  Lisinopril 10 Mg Tabs (Lisinopril) .... Take One By Mouth Daily  Allergies (verified): 1)  ! Diphenhydramine Hcl (Diphenhydramine Hcl) 2)  Infed (Iron Dextran)   Impression & Recommendations:  Problem # 1:  DM (ICD-250.00) Assessment Unchanged  Diabetes of: 2-3  yrs duration currently under: poorcontrol of blood glucose based on A1C and fasting CBGs of:230  and random readings of:300-350.   Control is suboptimal due [id number] lifestyle, poor eating habits AND to some extent prednisone use.  Denies hypoglycemic events.  Able to verbalize appropriate hypoglycemia management plan. Adjusted basal insulin Lantusto AM and in belly.  Changed novolog to 15 units prior to meals in the next week.   Written pt instructions provided:   F/U Rx Clinic Visit: 1 month.  TTFFC: 50  mins The following medications were removed from the  medication list:    Glucotrol Xl 10 Mg Xr24h-tab (Glipizide) .Marland Kitchen... 1 tab by mouth bid Her updated medication list for this problem includes:    Lantus 100 Unit/ml Soln (Insulin glargine) .Marland KitchenMarland KitchenMarland KitchenMarland Kitchen 50 units subcutaneously at bedtime. suppply quant sufficient for 1 month.  disp with needles, syringes    Novolog 100 Unit/ml Soln (Insulin aspart) .Marland Kitchen... Take 10 units prior to meals for one week then increase to 15 units prior to meals.   disp 2 vials. disp with insulin syringes    Lisinopril 10 Mg Tabs (Lisinopril) .Marland Kitchen... Take one by mouth daily  Orders: Inital Assessment Each 43min - [place] [phone])  Problem # 2:  OBESITY (ICD-278.00) Assessment: Unchanged Target weight discussed -  per patient 140.  Discussed 25 pound weight loss having significant impact on blood glucose control.  She verbalized understanding of goals and needed changes.  She is willing to work on weight loss.   Complete Medication List: 1)  Myfortic 360 Mg Tbec (Mycophenolate sodium) .... 3 tablets two times a day 2)  Prograf 1 Mg Caps (Tacrolimus) .... 2 caps by mouth in am and 3 caps at night. 3)  Tums 500 Mg Chew (Calcium carbonate antacid) .... 2-3 daily taken at once as needed 4)  Procrit 10000 Unit/ml Soln (Epoetin alfa) .... 30,000 units twice monthly - at short-stay 5)  Lantus 100 Unit/ml Soln (Insulin glargine) .... 50 units subcutaneously at bedtime. suppply quant sufficient for 1 month.  disp with needles, syringes 6)  Ascensia Breeze 2 Disk (Glucose blood) .... Test blood sugar four times daily. 7)  Bactrim 400-80 Mg Tabs (Sulfamethoxazole-trimethoprim) .Marland Kitchen.. 1 tab daily per kidney doctor. 8)  Prednisone 5 Mg Tabs (Prednisone) .Marland Kitchen.. 1 at bedtime 9)  Hydroxyzine Hcl 25 Mg Tabs (Hydroxyzine hcl) .Marland Kitchen.. 1 by mouth two times a day as needed itching 10)  Ranitidine Hcl 150 Mg Tabs (Ranitidine hcl) .Marland Kitchen.. 1 by mouth once daily as needed 11)  B-d Ultra-fine 30gx0.26ml  .... Use as directed please give short needles, and b-d name brand. 12)  Novolog 100 Unit/ml Soln (Insulin aspart) .... Take 10 units prior to meals for one week then increase to 15 units prior to meals.   disp 2 vials. disp with insulin syringes 13)  Simvastatin 20 Mg Tabs (Simvastatin) .... One by mouth at bedtime 14)  Lisinopril 10 Mg Tabs (Lisinopril) .... Take one by mouth daily 15)  Bd Insulin Syringe Ultrafine 30g X 1/2" 0.5 Ml Misc (Insulin syringe-needle u-100) .... Supply quantity for three shots per day dosing. dispense at least one box.  Patient Instructions: 1)  Lantus starting tomorrow AM 50 units in your belly.  2)  Change Novolog to 10 units prior to each meal twice daily.  In 7 days increase your Novolog to 5 units prior to  meals.  3)  Drink more water, limit amount of soda.  4)  Keep checking your blood sugar multiple times per day.  5)  Glucotrol XL. Stopped today.  6)  Follow up with Dr. Valentina Lucks in [facility] in 1 month.  Prescriptions: BD INSULIN SYRINGE ULTRAFINE 30G X 1/2" 0.5 ML MISC (INSULIN SYRINGE-NEEDLE U-100) Supply quantity for three shots per day dosing. Dispense at least one box.  #1 x 11   Entered by:   Janeann Forehand Pharm D   Authorized by:   Cletus Gash MD   Signed by:   Janeann Forehand Pharm D on 02/24/2010   Method used:   Electronically to  [facility]* (retail)       [place]       [place], [place]  [address]       Ph: [phone] or [phone]       Fax: [phone]   RxID:   [id number] NOVOLOG 100 UNIT/ML SOLN (INSULIN ASPART) take 10 units prior to meals for one week then increase to 15 units prior to meals.   Disp 2 vials. disp with insulin syringes  #1 x 5   Entered by:   Janeann Forehand Pharm D   Authorized by:   Cletus Gash MD   Signed by:   Janeann Forehand Pharm D on 02/24/2010   Method used:   Electronically to        [facility].* (retail)       [place]       [place], [place]  [address]       Ph: [phone] or [phone]       Fax: [phone]   RxID:   [id number] SIMVASTATIN 20 MG TABS (SIMVASTATIN) one by mouth at bedtime  #1 x 0   Entered and Authorized by:   Rinaldo Ratel D   Signed by:   Janeann Forehand Pharm D on 02/24/2010   Method used:   Historical   [id number] LANTUS 100 UNIT/ML SOLN (INSULIN GLARGINE) 50 units subcutaneously at bedtime. suppply quant sufficient for 1 month.  Disp with needles, syringes  #1 x 0   Entered by:   Janeann Forehand Pharm D   Authorized by:   Cletus Gash MD   Signed by:   Janeann Forehand Pharm D on 02/24/2010   Method used:   Electronically to        [facility].* (retail)       [place]       [place], [place]  [address]       Ph: [phone] or [phone]       Fax: [phone]   RxID:   [id number] PROCRIT [id number] UNIT/ML  SOLN (EPOETIN ALFA) 30,000 units twice monthly - at short-stay  #1 x 0   Entered and Authorized by:   Rinaldo Ratel D   Signed by:   Janeann Forehand Pharm D on 02/24/2010   Method used:   Historical   [id number] TUMS 500 MG  CHEW (CALCIUM CARBONATE ANTACID) 2-3 daily taken at once as needed  #1 x 0   Entered and Authorized by:   Rinaldo Ratel D   Signed by:   Janeann Forehand Pharm D on 02/24/2010   Method used:   Historical   [id number] PREDNISONE 5 MG TABS (PREDNISONE) 1 at bedtime  #1 x 0   Entered and Authorized by:   Rinaldo Ratel D   Signed by:   Janeann Forehand Pharm D on 02/24/2010   Method used:   Historical   [id number] PROGRAF 1 MG CAPS (TACROLIMUS) 2 caps by mouth in AM and 3 caps at night.  #1 x 0   Entered and Authorized by:   Rinaldo Ratel D   Signed by:   Janeann Forehand Pharm D on 02/24/2010   Method used:   Historical   [id number]  Orders Added: 1)  Inital Assessment Each 19min - [facility] [id number]    Prevention & Chronic Care Immunizations   Influenza vaccine: Fluvax Non-MCR  (12/04/2007)   Influenza vaccine due: 12/03/2008    Tetanus booster: 04/09/2008: Tdap   Tetanus booster due: 04/09/2018    Pneumococcal vaccine: Not documented  Other Screening   Pap smear: normal  (09/25/2007)   Pap smear action/deferral: Not indicated-other  (05/30/2009)   Pap smear due: 09/24/2009   Smoking status: never  (02/11/2010)  Diabetes Mellitus   HgbA1C: 10.3  (12/12/2009)   Hemoglobin A1C due: 02/01/2008    Eye exam: Not documented    Foot exam: yes  (05/30/2009)   High risk foot: Not documented   Foot care education: Not documented    Urine microalbumin/creatinine ratio: Not documented    Diabetes  flowsheet reviewed?: Yes   Progress toward A1C goal: Unchanged  Hypertension   Last Blood Pressure: 129 / 87  (02/24/2010)   Serum creatinine: 1.51  (01/29/2010)   Serum potassium 4.3  (01/29/2010)    Hypertension flowsheet reviewed?: Yes   Progress toward BP goal: At goal  Self-Management Support :   Personal Goals (by the next clinic visit) :     Personal A1C goal: 8  (11/19/2008)     Personal blood pressure goal: 130/80  (11/19/2008)     Personal LDL goal: 100  (11/19/2008)    Diabetes self-management support: Copy of home glucose meter record, CBG self-monitoring log, Written self-care plan  (09/02/2009)    Diabetes self-management support not done because: Good outcomes  (03/11/2009)    Hypertension self-management support: BP self-monitoring log, Written self-care plan  (11/19/2008)    Hypertension self-management support not done because: Good outcomes  (09/02/2009)

## 2010-04-16 NOTE — Miscellaneous (Signed)
Summary: refill  Clinical Lists Changes  Medications: Rx of LISINOPRIL 10 MG TABS (LISINOPRIL) take one by mouth daily;  #30 x 3;  Signed;  Entered by: Marcell Barlow RN;  Authorized by: Cletus Gash MD;  Method used: Electronically to [facility] [phone]*, [id number]  [address] [place], [place], [place]  [address], Ph: [phone] or [phone], Fax: [phone]    Prescriptions: LISINOPRIL 10 MG TABS (LISINOPRIL) take one by mouth daily  #30 x 3   Entered by:   Marcell Barlow RN   Authorized by:   Cletus Gash MD   Signed by:   Marcell Barlow RN on 03/17/2010   Method used:   Electronically to        [facility] [address] [phone]* (retail)       [address]       [place], [place]  [address]       Ph: [phone] or [phone]       Fax: [phone]   RxID:   [id number]

## 2010-04-16 NOTE — Letter (Signed)
Summary: Generic Letter  [facility]  [address]   [place], [place] [address]   Phone: [phone]  Fax: [phone]    04/08/2010  [facility] [place], [place]  [address]  To Whom it May concern:  The above named patient is seen in my clinic.  The patient has Turner's syndrome, and associated cognitive delay.  The patient is also treated for several medical conditions associated with Turner's syndrome, including needing a Kidney transplant and is now taking immunosuppressive medications.  She is being treated for hypertension and Type II Diabetes Mellitus. Please contact the office if further information is needed.     Sincerely,    Cletus Gash MD

## 2010-04-16 NOTE — Progress Notes (Signed)
Summary: Rx Lantus  Phone Note Refill Request Call back at [phone]   Refills Requested: Medication #1:  LANTUS 100 UNIT/ML SOLN 60 units subcutaneously each morning. suppply quant sufficient for 1 month.  Disp with needles Initial call taken by: Samara Snide,  April 06, 2010 10:16 AM    Prescriptions: LANTUS 100 UNIT/ML SOLN (INSULIN GLARGINE) 60 units subcutaneously each morning. suppply quant sufficient for 1 month.  Disp with needles, syringes  #2 x 6   Entered and Authorized by:   Candelaria Celeste MD   Signed by:   Candelaria Celeste MD on 04/06/2010   Method used:   Electronically to        [facility] (retail)       [place]       [place], [place]  [address]       Ph: [phone] or [phone]       Fax: [phone]   RxID:   [id number]

## 2010-04-16 NOTE — Assessment & Plan Note (Signed)
Summary: paperwork,df   Vital Signs:  Patient profile:   25 year old female Height:      65.25 inches Weight:      195.7 pounds BMI:     32.43 Temp:     98.6 degrees F oral Pulse rate:   85 / minute BP sitting:   137 / 91  (left arm) Cuff size:   regular  Vitals Entered By: Levert Feinstein LPN (January 25, [id number] 3:23 PM) CC: f/u meds, discuss paperwork Is Patient Diabetic? Yes Did you bring your meter with you today? No Pain Assessment Patient in pain? no        Primary Provider:  Cletus Gash MD  CC:  f/u meds and discuss paperwork.  History of Present Illness: Pt. reports she thinks Dr. Valentina Lucks is really helping her.  Most of her fasting blood sugars since she saw him last have been between 120's to 180's. She says that she does not always eat lunch so she thinks she should take 8 units of novolog with her first meal (11 am) and then 15 with dinner.  She denies any large snacks any other time of day.  Patient does not want to go see nutrition right now, she has been in the past and does not think they help her.  Pt has been logging blood pressures, and they have been running mostly 130's/90's.  Her K+ is elevated on recent BMET, and she is OK with holding lisinopril until she sees Nephrology February 7th.   Pt. is applying for disability and needs a letter written describing her medical problems and cognitive disabilities.  She says her mom said she has to send this in with her disability application.  Pt. says she has been walking on the treadmill and is thinking about joining the [employer].  She thinks having a trainer would be helpful because it would help her to have someone to push her.   Preventive Screening-Counseling & Management  Alcohol-Tobacco     Smoking Status: never  Allergies: 1)  ! Diphenhydramine Hcl (Diphenhydramine Hcl) 2)  Infed (Iron Dextran)  Review of Systems       ROS negative except HPI.   Physical Exam  General:  Well-developed,well-nourished,in  no acute distress; alert,appropriate and cooperative throughout examination Mouth:  Oral mucosa and oropharynx without lesions or exudates.  Teeth in good repair. Neck:  No deformities, masses, or tenderness noted. Lungs:  Normal respiratory effort, chest expands symmetrically. Lungs are clear to auscultation, no crackles or wheezes. Heart:  Normal rate and regular rhythm. S1 and S2 normal without gallop, murmur, click, rub or other extra sounds. Abdomen:  Bowel sounds positive,abdomen soft and non-tender without masses, organomegaly or hernias noted. Pulses:  2+ distal pulses in all 4 extremities.  Extremities:  No clubbing, cyanosis, edema, or deformity noted.    Impression & Recommendations:  Problem # 1:  DM (ICD-250.00) Improved blood sugars.  Pt. with much better understanding of taking medications.  She recently had A1C of 9.6, (down from 10.15 November 2009) will recheck again in a month or two.  F/U with Rx clinic in 4 weeks.  The following medications were removed from the medication list:    Lisinopril 20 Mg Tabs (Lisinopril) .Marland Kitchen... Take one by mouth daily Her updated medication list for this problem includes:    Lantus 100 Unit/ml Soln (Insulin glargine) .Marland KitchenMarland KitchenMarland KitchenMarland Kitchen 60 units subcutaneously each morning. suppply quant sufficient for 1 month.  disp with needles, syringes    Novolog 100 Unit/ml  Soln (Insulin aspart) .Marland Kitchen... Take 8 units prior to breakfast and lunch and then 15 units prior to evening meal. dispense quanity sufficient. disp with insulin syringes  Orders: [facility]- Est  Level 4 ([address])  Problem # 2:  ESSENTIAL HYPERTENSION, BENIGN (ICD-401.1) Pt has been on Lisinopril 10 by mouth daily, but her potassium was elevated.  Will hold medication for now, she is following up wtih Nephrology Feb 7, will allow them to make judgement about best BP meds.   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 with Turner's syndrome and associated cognitive delay.   Will write letter for disability application.  Orders: [facility]- Est  Level 4 ([address])  Problem # 4:  OBESITY (ICD-278.00) Weight is stable.  Pt. seems to be exercising more, better motivation.  Resistance to diet changes at this time.  Will cont to encourage her.  At this time she declines going to see a nutritionist, saying they have not helped much in the past.   Orders: [facility]- Est  Level 4 ([address])  Complete Medication List: 1)  Myfortic 360 Mg Tbec (Mycophenolate sodium) .... 3 tablets two times a day 2)  Prograf 1 Mg Caps (Tacrolimus) .... 2 caps by mouth in am and 3 caps at night. 3)  Tums 500 Mg Chew (Calcium carbonate antacid) .... 2 daily 4)  Procrit 10000 Unit/ml Soln (Epoetin alfa) .... 30,000 units twice monthly - at short-stay 5)  Lantus 100 Unit/ml Soln (Insulin glargine) .... 60 units subcutaneously each morning. suppply quant sufficient for 1 month.  disp with needles, syringes 6)  Ascensia Breeze 2 Disk (Glucose blood) .... Test blood sugar four times daily. 7)  Bactrim 400-80 Mg Tabs (Sulfamethoxazole-trimethoprim) .Marland Kitchen.. 1 tab daily per kidney doctor. 8)  Prednisone 5 Mg Tabs (Prednisone) .Marland Kitchen.. 1 at bedtime 9)  Hydroxyzine Hcl 25 Mg Tabs (Hydroxyzine hcl) .Marland Kitchen.. 1 by mouth two times a day as needed itching 10)  Ranitidine Hcl 150 Mg Tabs (Ranitidine hcl) .Marland Kitchen.. 1 by mouth once daily as needed 11)  B-d Ultra-fine 30gx0.33ml  .... Use as directed please give short needles, and b-d name brand. 12)  Novolog 100 Unit/ml Soln (Insulin aspart) .... Take 8 units prior to breakfast and lunch and then 15 units prior to evening meal. dispense quanity sufficient. disp with insulin syringes 13)  Simvastatin 20 Mg Tabs (Simvastatin) .... One by mouth at bedtime 14)  Bd Insulin Syringe Ultrafine 30g X 1/2" 0.5 Ml Misc (Insulin syringe-needle u-100) .... Supply quantity for three shots per day dosing. dispense at least one box.  Patient Instructions: 1)  It was good to see you.  Keep working at your  blood sugars and take your insulin as Dr. Valentina Lucks has taught you. 2)  I think you would also benefit from meeting with Dr. Jenne Campus, our nutritionist.   3)  Your Potassium is high, so I want you to stop taking your Lisinopril until you see your Kidney Doctor.  4)  Please contact the office if you have any questions or concerns.  Prescriptions: NOVOLOG 100 UNIT/ML SOLN (INSULIN ASPART) take 8 units prior to breakfast and lunch and then 15 units prior to evening meal. Dispense quanity sufficient. disp with insulin syringes  #2 x 3   Entered and Authorized by:   Cletus Gash MD   Signed by:   Cletus Gash MD on 04/08/2010   Method used:   Electronically to        [facility]* (retail)       [address]       [place], [place]  [address]       Ph: [phone] or [phone]       Fax: [phone]   RxID:   [id number] LANTUS 100 UNIT/ML SOLN (INSULIN GLARGINE) 60 units subcutaneously each morning. suppply quant sufficient for 1 month.  Disp with needles, syringes  #2 x 6   Entered and Authorized by:   Cletus Gash MD   Signed by:   Cletus Gash MD on 04/08/2010   Method used:   Electronically to        [facility].* (retail)       [place] [address]       [place], [place]  [address]       Ph: [phone] or [phone]       Fax: [phone]   RxID:   [id number] LISINOPRIL 20 MG TABS (LISINOPRIL) take one by mouth daily  #30 x 6   Entered and Authorized by:   Cletus Gash MD   Signed by:   Cletus Gash MD on 04/08/2010   Method used:   Electronically to        [facility]. [address] [phone]* (retail)       [address]       [place], [place]  [address]       Ph: [phone] or [phone]       Fax: [phone]   RxID:   [id number]    Orders Added: 1)  [facility]- Est  Level 4 [id number]

## 2010-04-16 NOTE — Miscellaneous (Signed)
Summary: Handicap placard  pt dropped off handicap placard form to be completed, gave to Hedy Camara for any clinical completion. Samara Snide  March 10, 2010 11:18 AM    Handicapped Placard placed in Dr. Venetia Constable box for signature.  Hedy Camara  March 10, 2010 11:27 AM  Placard signed and placed in box to notify patient.  Cletus Gash, MD 03/10/2010 1:33 PM.

## 2010-04-23 ENCOUNTER — Encounter (HOSPITAL_COMMUNITY)
Admission: RE | Admit: 2010-04-23 | Discharge: 2010-04-23 | Disposition: A | Discharge status: Home / Self Care | Payer: Medicare Other | Source: Ambulatory Visit | Attending: Nephrology | Admitting: Nephrology

## 2010-04-23 ENCOUNTER — Other Ambulatory Visit: Payer: Self-pay

## 2010-04-23 DIAGNOSIS — D638 Anemia in other chronic diseases classified elsewhere: Secondary | ICD-10-CM | POA: Insufficient documentation

## 2010-04-23 DIAGNOSIS — N183 Chronic kidney disease, stage 3 unspecified: Secondary | ICD-10-CM | POA: Insufficient documentation

## 2010-04-24 LAB — POCT HEMOGLOBIN-HEMACUE: Hemoglobin: 11 g/dL — ABNORMAL LOW (ref 12.0–15.0)

## 2010-05-07 ENCOUNTER — Other Ambulatory Visit: Payer: Self-pay

## 2010-05-07 ENCOUNTER — Encounter (HOSPITAL_COMMUNITY): Payer: Medicare Other

## 2010-05-08 ENCOUNTER — Ambulatory Visit (INDEPENDENT_AMBULATORY_CARE_PROVIDER_SITE_OTHER): Payer: Medicaid Other | Admitting: Pharmacist

## 2010-05-08 ENCOUNTER — Encounter: Payer: Self-pay | Admitting: Pharmacist

## 2010-05-08 VITALS — BP 135/92 | HR 86 | Wt 195.8 lb

## 2010-05-08 DIAGNOSIS — E119 Type 2 diabetes mellitus without complications: Secondary | ICD-10-CM

## 2010-05-08 DIAGNOSIS — I10 Essential (primary) hypertension: Secondary | ICD-10-CM

## 2010-05-08 LAB — POCT HEMOGLOBIN-HEMACUE: Hemoglobin: 11.2 g/dL — ABNORMAL LOW (ref 12.0–15.0)

## 2010-05-08 MED ORDER — INSULIN GLARGINE 100 UNIT/ML ~~LOC~~ SOLN: 50.0000 [IU] | SUBCUTANEOUS | Status: DC

## 2010-05-08 MED ORDER — INSULIN SYRINGE-NEEDLE U-100 30G X 1/2" 0.5 ML MISC: Status: DC

## 2010-05-08 MED ORDER — INSULIN ASPART 100 UNIT/ML ~~LOC~~ SOLN: SUBCUTANEOUS | Status: DC

## 2010-05-08 NOTE — Assessment & Plan Note (Signed)
BP uncontrolled at today's visit, above goal of <130/80. Pt did not take BP meds this morning which is most likely reason. Pt log book reflects well controlled BP. Recent change to meds was change from lisinopril to lisinopril/HCTZ to help manage hyperkalemia. Recheck at f/u appointment.

## 2010-05-08 NOTE — Patient Instructions (Addendum)
1)  No change in insulin today.  2)  Keep up the great work with your blood sugar management. 3)  Follow up with Dr. Barbra Sarks in 4-6 weeks.

## 2010-05-08 NOTE — Assessment & Plan Note (Addendum)
Diabetes Type 2 currently improved and well controlled based on BG values. Fasting and postprandial BGs 100s-200s. No changes to insulin. Continue Lantus 50 units every AM and Novolog 8 units with breakfast, 15 units with dinner, and 8 units when pt has lunchtime meal. Pt reports no hypoglycemic events and expresses understanding of hypoglycemia management. Encouraged pt to continue to exercise to further improve BG readings. Weight unchanged since visit in December. Pt reports having recent A1c; however last recorded was 10.3 in September. Check A1c at next visit. Pt to f/u with Dr. Barbra Sarks in 4-6 weeks. F/u with Rx Clinic this summer. TTFT: 30 minutes. Pt seen with Gypsy Lore, PharmD.

## 2010-05-08 NOTE — Progress Notes (Signed)
  Subjective:    Patient ID: [name], female    DOB: 08/16/1985, 25 y.o.   MRN: [id number]  HPI Arrives in good spirits and reports she has been doing well.  She brings with her all of her medications and log book of BG readings 3x daily and BP checks daily.  Pt reports compliance with insulin regimen. She only eats lunch a couple of days out of the week and does not give herself lunchtime Novolog if she does not eat. Fasting CBGs 92-200. Postprandial CBGs 130-250. Most values in 100s-200s. Lowest 92, highest 324. Pt reports no lows.   Pt reports she did not take her blood pressure medications this AM. BP readings recorded in logbook all <130/80.  She has recently bought a jump rope and tries to do this for 15 minutes a couple of days each week.   Review of Systems     Objective:   Physical Exam        Assessment & Plan:

## 2010-05-14 ENCOUNTER — Other Ambulatory Visit: Payer: Self-pay

## 2010-05-14 ENCOUNTER — Encounter (HOSPITAL_COMMUNITY): Payer: Medicare Other | Attending: Nephrology

## 2010-05-14 DIAGNOSIS — D638 Anemia in other chronic diseases classified elsewhere: Secondary | ICD-10-CM | POA: Insufficient documentation

## 2010-05-14 DIAGNOSIS — N183 Chronic kidney disease, stage 3 unspecified: Secondary | ICD-10-CM | POA: Insufficient documentation

## 2010-05-26 LAB — POCT HEMOGLOBIN-HEMACUE: Hemoglobin: 10.7 g/dL — ABNORMAL LOW (ref 12.0–15.0)

## 2010-05-27 LAB — POCT HEMOGLOBIN-HEMACUE: Hemoglobin: 10.9 g/dL — ABNORMAL LOW (ref 12.0–15.0)

## 2010-05-28 ENCOUNTER — Encounter (HOSPITAL_COMMUNITY): Payer: Medicare Other

## 2010-05-28 ENCOUNTER — Other Ambulatory Visit: Payer: Self-pay

## 2010-05-28 LAB — POCT HEMOGLOBIN-HEMACUE: Hemoglobin: 10.9 g/dL — ABNORMAL LOW (ref 12.0–15.0)

## 2010-05-30 LAB — POCT HEMOGLOBIN-HEMACUE: Hemoglobin: 9.7 g/dL — ABNORMAL LOW (ref 12.0–15.0)

## 2010-05-31 LAB — POCT HEMOGLOBIN-HEMACUE
Hemoglobin: 10.6 g/dL — ABNORMAL LOW (ref 12.0–15.0)
Hemoglobin: 10.4 g/dL — ABNORMAL LOW (ref 12.0–15.0)

## 2010-06-01 LAB — POCT HEMOGLOBIN-HEMACUE: Hemoglobin: 11.1 g/dL — ABNORMAL LOW (ref 12.0–15.0)

## 2010-06-03 LAB — POCT HEMOGLOBIN-HEMACUE: Hemoglobin: 11.5 g/dL — ABNORMAL LOW (ref 12.0–15.0)

## 2010-06-05 LAB — POCT HEMOGLOBIN-HEMACUE
Hemoglobin: 10.1 g/dL — ABNORMAL LOW (ref 12.0–15.0)
Hemoglobin: 10.6 g/dL — ABNORMAL LOW (ref 12.0–15.0)
Hemoglobin: 10.8 g/dL — ABNORMAL LOW (ref 12.0–15.0)

## 2010-06-11 ENCOUNTER — Encounter (HOSPITAL_COMMUNITY): Payer: Medicare Other

## 2010-06-11 ENCOUNTER — Other Ambulatory Visit: Payer: Self-pay | Admitting: Nephrology

## 2010-06-11 LAB — POCT HEMOGLOBIN-HEMACUE: Hemoglobin: 10.6 g/dL — ABNORMAL LOW (ref 12.0–15.0)

## 2010-06-16 LAB — POCT HEMOGLOBIN-HEMACUE: Hemoglobin: 10.5 g/dL — ABNORMAL LOW (ref 12.0–15.0)

## 2010-06-17 LAB — POCT HEMOGLOBIN-HEMACUE: Hemoglobin: 10.6 g/dL — ABNORMAL LOW (ref 12.0–15.0)

## 2010-06-18 ENCOUNTER — Ambulatory Visit (INDEPENDENT_AMBULATORY_CARE_PROVIDER_SITE_OTHER): Payer: Medicaid Other | Admitting: Family Medicine

## 2010-06-18 ENCOUNTER — Encounter: Payer: Self-pay | Admitting: Family Medicine

## 2010-06-18 VITALS — BP 115/78 | HR 93 | Temp 98.4°F | Wt 196.0 lb

## 2010-06-18 DIAGNOSIS — E119 Type 2 diabetes mellitus without complications: Secondary | ICD-10-CM

## 2010-06-18 DIAGNOSIS — E669 Obesity, unspecified: Secondary | ICD-10-CM

## 2010-06-18 DIAGNOSIS — F8189 Other developmental disorders of scholastic skills: Secondary | ICD-10-CM

## 2010-06-18 DIAGNOSIS — I10 Essential (primary) hypertension: Secondary | ICD-10-CM

## 2010-06-18 LAB — POCT HEMOGLOBIN-HEMACUE: Hemoglobin: 11.1 g/dL — ABNORMAL LOW (ref 12.0–15.0)

## 2010-06-18 LAB — IRON AND TIBC
Iron: 79 ug/dL (ref 42–135)
Saturation Ratios: 31 % (ref 20–55)
TIBC: 253 ug/dL (ref 250–470)

## 2010-06-18 LAB — POCT GLYCOSYLATED HEMOGLOBIN (HGB A1C): Hemoglobin A1C: 11.6

## 2010-06-18 NOTE — Patient Instructions (Signed)
It was good to see you.  Your hemoglobin A1c was 11.6.  I am concerned that your Diabetes control is not as good as it should be, and I think that part of this is because you have to give your insulin in your belly, not in your arm.  Remember that the reason for this is that your body does not use the insulin properly if it is given in your arm.  I want to see you back in 3 months for your diabetes.  Remember that your morning blood sugar should be between 80-120.  If your morning sugar is much more elevated than that, please call the office and ask for an appointment. Your blood pressure is 115/79- which is right where it should be.  Please keep trying to exercise every day, at least 30 minutes a day, and work up to one hour a day.

## 2010-06-19 LAB — RENAL FUNCTION PANEL
Albumin: 4.1 g/dL (ref 3.5–5.2)
BUN: 24 mg/dL — ABNORMAL HIGH (ref 6–23)
Calcium: 8.5 mg/dL (ref 8.4–10.5)
Creatinine, Ser: 1.65 mg/dL — ABNORMAL HIGH (ref 0.4–1.2)
GFR calc Af Amer: 46 mL/min — ABNORMAL LOW (ref >60)
GFR calc non Af Amer: 38 mL/min — ABNORMAL LOW (ref >60)

## 2010-06-19 LAB — IRON AND TIBC
Iron: 46 ug/dL (ref 42–135)
Saturation Ratios: 16 % — ABNORMAL LOW (ref 20–55)
TIBC: 283 ug/dL (ref 250–470)
UIBC: 237 ug/dL

## 2010-06-19 LAB — FERRITIN: Ferritin: 789 ng/mL — ABNORMAL HIGH (ref 10–291)

## 2010-06-21 ENCOUNTER — Encounter: Payer: Self-pay | Admitting: Family Medicine

## 2010-06-21 NOTE — Assessment & Plan Note (Signed)
Pt with worsening blood sugar control, A1C 11.6, was in 9 range a few months ago.  Reviewed that insulin must be given in the abdomen, not in the arms or it is not used properly.  Asked [name] to keep log of blood sugars and to come in for a visit in about a month if she is having many fasting blood sugars above 150.

## 2010-06-21 NOTE — Assessment & Plan Note (Signed)
Pt continues to have difficulty with weight loss.  She has both financial factors and a learning disability as barriers to her success.  She again refuses referral to Nutritionist.  Reviewed portion sizes/ what plate should look like (1/2 fruits and vegetables, 1/4 whole grain, 1/4 lean protein, on an 8-9 '' plate).  Encouraged pt to exercise more- with a goal of working up to an hour every day.

## 2010-06-21 NOTE — Assessment & Plan Note (Signed)
Well controlled. Continue current medication.  

## 2010-06-21 NOTE — Assessment & Plan Note (Signed)
[name]'s learning disability continues to be a large barrier in her optimizing her health.  Will continue to try to teach Diabetes, exercise, and healthy eating to her on a level that she can use the information.

## 2010-06-21 NOTE — Progress Notes (Signed)
  Subjective:    Patient ID: [name], female    DOB: 08-19-1985, 25 y.o.   MRN: [id number]  HPI [name] comes in for follow up of her chronic medical problems:  DM- she has seen Dr. Valentina [name] in Rx clinic and he has really helped her understand how to take her insulin.  She reports fasting blood sugars from 100-250's over the past several months.  After some questioning, the patient admits that when she first saw Dr. Valentina [name] he found out she was giving herself her insulin shots in her arm, and had her change to giving it to her belly.  She says that because of her surgery she has pain when getting shots in her belly, so she switched back to giving it in her arm.  The patient cannot recall what the reason Dr. Valentina [name] asked her to switch was.   HTN- pt is now on lisinopril/HCTZ per nephrology with good blood pressure control. She denies any chest pain, shortness of breath, dizziness or vision changes with the medication.   Obesity- [name] reports that she walks on the treadmill 3-4 days a week for about 20-30 minutes.  She admits that she could probably walk for longer.  She says she also likes to jump rope and hula-hoop, but does these things infrequently.  She says she tries to eat healthy but she and her mother are having difficulties financially right now and it is hard to buy healthy foods.     Review of Systems  Constitutional: Negative for fever and activity change.  HENT: Negative for rhinorrhea.   Eyes: Negative for visual disturbance.  Respiratory: Negative for shortness of breath.   Cardiovascular: Negative for chest pain.  Gastrointestinal: Negative for abdominal pain.  Genitourinary: Negative for dysuria.  Musculoskeletal: Negative for arthralgias.  Skin: Negative for rash.  Neurological: Negative for headaches.       Objective:   Physical Exam BP 115/78  Pulse 93  Temp(Src) 98.4 F (36.9 C) (Oral)  Wt 196 lb (88.905 kg) General appearance: alert, cooperative and no  distress Eyes: conjunctivae/corneas clear. PERRL, EOM's intact. Fundi benign. Throat: lips, mucosa, and tongue normal; teeth and gums normal Lungs: clear to auscultation bilaterally Heart: regular rate and rhythm, S1, S2 normal, no murmur, click, rub or gallop Abdomen: soft, non-tender; bowel sounds normal; no masses,  no organomegaly Extremities: extremities normal, atraumatic, no cyanosis or edema Pulses: 2+ and symmetric Psych: Mood, speech and affect are normal.  Judgement and insight poor and inappropriate for age.        Assessment & Plan:

## 2010-06-22 LAB — IRON AND TIBC
Iron: 76 ug/dL (ref 42–135)
Saturation Ratios: 30 % (ref 20–55)
TIBC: 257 ug/dL (ref 250–470)

## 2010-06-22 LAB — POCT HEMOGLOBIN-HEMACUE: Hemoglobin: 11.3 g/dL — ABNORMAL LOW (ref 12.0–15.0)

## 2010-06-22 LAB — FERRITIN: Ferritin: 823 ng/mL — ABNORMAL HIGH (ref 10–291)

## 2010-06-23 LAB — IRON AND TIBC
Iron: 74 ug/dL (ref 42–135)
TIBC: 252 ug/dL (ref 250–470)
UIBC: 178 ug/dL

## 2010-06-23 LAB — FERRITIN: Ferritin: 722 ng/mL — ABNORMAL HIGH (ref 10–291)

## 2010-06-24 LAB — POCT HEMOGLOBIN-HEMACUE
Hemoglobin: 9.9 g/dL — ABNORMAL LOW (ref 12.0–15.0)
Hemoglobin: 10.4 g/dL — ABNORMAL LOW (ref 12.0–15.0)

## 2010-06-25 ENCOUNTER — Encounter (HOSPITAL_COMMUNITY): Payer: Medicare Other | Attending: Nephrology

## 2010-06-25 ENCOUNTER — Other Ambulatory Visit: Payer: Self-pay | Admitting: Nephrology

## 2010-06-25 DIAGNOSIS — D638 Anemia in other chronic diseases classified elsewhere: Secondary | ICD-10-CM | POA: Insufficient documentation

## 2010-06-25 DIAGNOSIS — N183 Chronic kidney disease, stage 3 unspecified: Secondary | ICD-10-CM | POA: Insufficient documentation

## 2010-06-25 LAB — POCT HEMOGLOBIN-HEMACUE: Hemoglobin: 10.5 g/dL — ABNORMAL LOW (ref 12.0–15.0)

## 2010-06-25 LAB — GLUCOSE, CAPILLARY: Glucose-Capillary: 120 mg/dL — ABNORMAL HIGH (ref 70–99)

## 2010-06-25 LAB — IRON AND TIBC
Iron: 61 ug/dL (ref 42–135)
UIBC: 184 ug/dL

## 2010-06-26 ENCOUNTER — Encounter (HOSPITAL_COMMUNITY): Payer: Medicaid Other

## 2010-06-29 LAB — POCT HEMOGLOBIN-HEMACUE: Hemoglobin: 10.2 g/dL — ABNORMAL LOW (ref 12.0–15.0)

## 2010-06-30 LAB — POCT HEMOGLOBIN-HEMACUE: Hemoglobin: 10.1 g/dL — ABNORMAL LOW (ref 12.0–15.0)

## 2010-07-09 ENCOUNTER — Other Ambulatory Visit: Payer: Self-pay | Admitting: Nephrology

## 2010-07-09 ENCOUNTER — Encounter (HOSPITAL_COMMUNITY): Payer: Medicare Other

## 2010-07-10 LAB — POCT HEMOGLOBIN-HEMACUE: Hemoglobin: 10.3 g/dL — ABNORMAL LOW (ref 12.0–15.0)

## 2010-07-16 ENCOUNTER — Telehealth: Payer: Self-pay | Admitting: Family Medicine

## 2010-07-16 NOTE — Telephone Encounter (Signed)
Asking to speak with RN about running out of insulin early

## 2010-07-16 NOTE — Telephone Encounter (Signed)
Called back at number requested , message again left to return call.

## 2010-07-16 NOTE — Telephone Encounter (Signed)
Called back , message left to return call.

## 2010-07-16 NOTE — Telephone Encounter (Signed)
Spoke with patient and the problem is when her refill on Lantus was sent in in Feb only one vial for 30 days was ordered. She takes 50 units daily so one vial will only last 20 days. Called pharmacy and advised them to give her 2 vials with each refill.

## 2010-07-16 NOTE — Telephone Encounter (Signed)
Called both numbers again and left message to call back.

## 2010-07-16 NOTE — Telephone Encounter (Signed)
Pt calling again, says she will stay by her phone [phone]

## 2010-07-16 NOTE — Telephone Encounter (Signed)
Pt returned call, asking to be called at the 2nd number provided

## 2010-07-23 ENCOUNTER — Encounter (HOSPITAL_COMMUNITY): Payer: Medicare Other | Attending: Nephrology

## 2010-07-23 ENCOUNTER — Other Ambulatory Visit: Payer: Self-pay | Admitting: Nephrology

## 2010-07-23 DIAGNOSIS — D638 Anemia in other chronic diseases classified elsewhere: Secondary | ICD-10-CM | POA: Insufficient documentation

## 2010-07-23 DIAGNOSIS — N183 Chronic kidney disease, stage 3 unspecified: Secondary | ICD-10-CM | POA: Insufficient documentation

## 2010-07-27 ENCOUNTER — Encounter: Payer: Self-pay | Admitting: Family Medicine

## 2010-07-28 NOTE — Discharge Summary (Signed)
[place], BESIC NO.:  [id number]   MEDICAL RECORD NO.:  [id number]          PATIENT TYPE:  INP   LOCATION:  5023                         FACILITY:  [place]   PHYSICIAN:  Columb[place] A. Walker Kehr, M.D.    DATE OF BIRTH:  07/22/1985   DATE OF ADMISSION:  10/26/2007  DATE OF DISCHARGE:  10/29/2007                               DISCHARGE SUMMARY   DISCHARGE DIAGNOSES:  1. Diabetes type 2, secondary to steroids.  2. Status post renal transplant, secondary to focal segmental      glomerulosclerosis.  3. Hypertension  4. 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  5. Fever - resolved.   DISCHARGE MEDICATIONS:  1. Lantus 23 units subcutaneous every day.  2. Bactrim double strength 1 tablet daily.  3. Prednisone 10 mg p.o. daily.  4. Calcium p.o. daily.  5. Prograf 5 mg p.o. b.i.d.  6. Myfortic 360 mg p.o. b.i.d.  7. Procrit 30,000 units q. Month.   Prescriptions were given for the Lantus as well as for glucometer,  lancets, strips, and syringes and needles for the insulin.   FOLLOWUP:  The patient will follow up with Dr. Marjory Lies at [facility] on November 01, 2007 at 10:35.  She will also follow up with Dr.  Lorrene Reid as scheduled this week at [facility].   ISSUES AT FOLLOWUP:  The patient is a newly diagnosed type 2 diabetic  secondary to insulin.  She has a normal C-peptide, though I will  consider adding an oral hypoglycemic to her insulin regimen with a goal  of decrease in her Lantus.  I would also recommend further diabetes  nutrition education as well as teaching of insulin.   HOSPITAL COURSE:  1. See H&P for details.  Briefly, the patient was admitted for      hyperglycemia likely secondary to her diabetes.  It was noted that      in the past since being on prednisone, she has had elevated fasting      blood sugars.  She was admitted initially and put on Glucommander      insulin drip.  She was transitioned to Lantus and has done      relatively well, although her sugars  do remain elevated in the 200-      300s.  She was provided with diabetes nutrition while here.  She      seemed to have an understanding of the low carb diet.  Although her      sugars remained somewhat elevated, her labs remained normal.  She      was never acidotic.  The patient desires to go home and we felt      comfortable sending her home with close followup.  She has family      members that will be able to give her insulin and teach her as      well.  Of note, the C-peptide was normal and likely this indicates      her pancreas does have some function and capability and therefore,      oral hypoglycemic this such as  sulfonylurea may be added to her      regimen in the future with a goal of decreasing her amount of      insulin needed.  2. Status post transplant:  We continued the patient on her      prednisone, Prograf, Myfortic, and Bactrim per her regimen after      transplant.  No changes were made.  3. Hypertension:  The patient remained normotensive throughout this      hospitalization.  No changes were made.  4. Fever:  The patient really had a subjective fever at home and blood      cultures were obtained given the fact that she is a transplant      patient.  Blood cultures were no growth to date.  The patient has      remained afebrile and not on antibiotics during this hospital      course.  Therefore, we think this is a erroneous temperature.   PERTINENT LABORATORY DATA:  On admission, the patient's glucose was  greater than 700, however, her pH was 7.347, which is not acidotic and  her CO2 was normal at 49.7 and her bicarb was actually high at 27.2.  Repeat BMET showed a trend down sugars in the 300s and eventually 200.  C-peptide normal at 3.38.   IMAGES:  None.   CONSULTATIONS:  None.      Kasandra Knudsen, M.D.  Electronically Leslie A. Walker Kehr, M.D.  Electronically Signed    JT/MEDQ  D:  10/29/2007  T:  10/29/2007  Job:  [id number]   cc:    Elzie Rings. Lorrene Reid, M.D.  Arnette Norris, M.D.

## 2010-07-31 ENCOUNTER — Encounter: Payer: Self-pay | Admitting: Family Medicine

## 2010-07-31 NOTE — Discharge Summary (Signed)
[place]  Patient:    [name], [name]                        MRN: [id number] Adm. Date:  [id number] Disc. Date: 10/19/99 Attending:  Lance Sell Dictator:   Bertha Stakes, P.A. CC:         Kennan   Discharge Summary  DISCHARGE DIAGNOSES: 1. Status post CT-guided aspiration of recurrent pelvic fluid collection with    complications of respiratory distress requiring BiPAP. 2. Respiratory distress, resolved at discharge. 3. End-stage renal disease secondary to hypertension. 4. Recent peritonitis. 5. Hypertension.  PROCEDURES:  CT-guided aspiration of a pelvic abscess superior to the bladder, and placement of an 8 French pigtail catheter.  Approximately 20 cc of fluid was aspirated.  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  She dialyzed at the time of admission via a right IJ Perma-Cath, and was admitted after some complications when during a CT-guided aspiration of a pelvic abscess.  During the procedure approximately 75 cc of purulent fluid was removed from the abscess, and a JP drain was placed.  Towards the end of the procedure [name] developed some respiratory distress and had decreased O2 saturations.  Prior to procedure she had received 2 mg of Versed and 100 mg of phentanyl.  Oxygen saturations following procedure were in the mid 80s.  Chest x-ray was performed which showed pulmonary edema, possibly an infiltrative process on the right.  She was admitted to the hospital for emergent hemodialysis for volume removal. Three liters of fluid was removed  during hemodialysis, however, she developed decreased breath sounds and worsening respiratory status, and was placed on BiPAP on hemodialysis.  She continued to cough up thick mucus while dialyzing. Blood cultures were obtained at that time and sputum was sent for C&S.  She was started empirically on Zosyn.  Labs at that time showed a white count of 14,000, hemoglobin 9.4, potassium 5.0, creatinine 6.4, glucose 108, CO2 24, chloride of 100.  HOSPITAL COURSE:  Initially [name] was admitted to the ICU where she received BiPAP.  BiPAP was gradually weaned off within 12 hours of her admission.  Repeat chest x-ray following hemodialysis showed improved aeration.  She remained afebrile throughout the remainder of her hospitalization.  Blood cultures were negative.  Cultures sent from the aspirated pelvic abscess showed gram negative rods on the Gram stain, however, on the culture no organism was grown.  She was continued empirically on Zosyn throughout the remainder of her hospitalization, and she was discharged on Augmentin for an additional two weeks prophylactically.  At the time of discharge, plans were made for a followup CT scan to monitor recurrent fluid collections in her abdomen and pelvis, as well as a follow-up appointment with Dr. Dalbert Batman.  During the hospitalization, [name] had continued difficulty with her hypertension.  She had been on labetalol 400 mg q.d. as an outpatient.  In the hospital she was placed on p.r.n. klonodine initially, however, she continued to have systolic blood pressures in the 180s and 190s despite fluid removal with hemodialysis.  We continued to try to lower her  blood pressure with fluid removal at dialysis, however, we were only able to obtain approximately 6000 L of fluid removal during the hospitalization.  Initially, Procardia was added to her labetalol, however, she again had inadequate blood pressure control. Her Procardia was discontinued, and  her labetalol dose was increased to 600 mg b.i.d., and Norvasc 5 mg p.o. q.d. was added.  Her blood pressures at discharge were 160/102.  Blood pressure will need to be continued to be monitored on an outpatient basis.  DISCHARGE MEDICATIONS: 1. Nephro-Vite one p.o. q.d. 2. Calcium 500 mg one p.o. t.i.d. with meals. 3. Resource one can p.o. q.d. 4. Labetalol 600 mg one p.o. b.i.d. 5. Norvasc 5 mg one p.o. q.d. 6. Augmentin 500 mg one p.o. b.i.d. x 2 weeks. 7. Epogen 2500 units IV every hemodialysis.  DISCHARGE INSTRUCTIONS: 1. [name] is to follow a kidney failure diet of 80 g protein, 2 g sodium,    2 g potassium, with 5 cup fluid restriction per day.  Her new estimated dry    weight at the kidney center will be 42 kg to be lowered after one treatment    to 41.5, and then again lowered to 41.0 the following treatment. 2. She is to follow up at hemodialysis on Mondays, Wednesdays, and Fridays at    the [facility]. 3. In addition, she will have a follow-up appointment on October 22, 1999, at    9:15 with Dr. Dalbert Batman.  CONDITION ON DISCHARGE:  Stable.  DISPOSITION:  Discharged to home. DD:  11/17/99 TD:  11/18/99 Job: [id number]

## 2010-07-31 NOTE — Op Note (Signed)
[place]. [facility]  Patient:    [name], [name] Visit Number: [id number] MRN: [id number]          Service Type: DSU Location: [facility] [id number] Attending Physician:  Dorothea Glassman Dictated by:   Judeth Cornfield Scot Dock, M.D. Proc. Date: 08/15/01 Admit Date:  08/15/2001 Discharge Date: 08/15/2001                             Operative Report  PREOPERATIVE DIAGNOSIS:  Chronic renal failure.  POSTOPERATIVE DIAGNOSIS:  Chronic renal failure.  OPERATION PERFORMED: 1. Placement of new right upper arm arteriovenous graft. 2. Ultrasound of both internal jugular veins. 3. Placement of a right internal jugular Diatek catheter.  SURGEON:  Judeth Cornfield. Scot Dock, M.D.  ASSISTANT:  Marcellus Scott, P.A.  ANESTHESIA:  General.  DESCRIPTION OF PROCEDURE:  The patient was taken to the operating room and initially was sedated by Anesthesia.  We started by placing the IJ catheter. The neck and upper chest were prepped and draped in the usual sterile fashion. The patient was poorly cooperative and ultimately it was decided to be safest to have a general anesthetic.  We had marked both internal jugular veins with a ultrasound scanner prior to prepping the neck.  I was unable to cannulate the right internal jugular vein.  Therefore, I attempted a left IJ approach. I was able to get into the vein but was unable to thread a wife.  At this point, we were unable to do fluoroscopy as the bed had been positioned backwards.  Therefore we decided to start over.  The patients drapes were taken down.  The patient was put back on the hospital bed.  The bed was repositioned and the patient replaced on the bed so that we could successfully fluoroscope.  Now that the patient had a general anesthetic and was cooperative, I did the ultrasound again and it looked like the vein was more medial than had previously been marked as the patient had moved around quite a bit.  The neck  and upper chest were then prepped and draped in the usual sterile fashion.  After the IJ was cannulated with a finder needle, the IJ was then cannulated and the guide wire introduced into the inferior vena cava. The tract over the wire was dilated and then the dilator and peel-away sheath were passed over the wire and the wire and dilator removed.  The catheter was passed through the peel-away sheath and positioned in the right atrium.  The exit site for the catheter was then selected and then the tunnel passed between the two incisions and the catheter brought through the tunnel.  The distal ports were attached.  Both ports withdrew easily, were then flushed with heparinized saline and filled with concentrated heparin.  The catheter was secured at its exit site with a 3-0 nylon suture.  The IJ cannulation site was closed with a 4-0 subcuticular stitch.  A sterile dressing was applied. The patient tolerated the procedure well.  Next, attention was turned to placement of a right upper arm arteriovenous graft.  The right upper extremity was prepped and draped in the usual sterile fashion.  After the longitudinal incision was made at the antecubital level, the brachial artery was dissected free, was actually a decent sized artery; however, it spasmed down fairly easily.  A separate longitudinal incision was made beneath the axilla.  A tunnel was created between the two incisions  and a 4 to 7 mm graft was then tunneled between the two incisions.  The patient was heparinized with 5000 units of IV heparin.  The brachial artery was clamped proximally and distally and a longitudinal arteriotomy was made.  A segment of the 4 mm end of the graft was excised, the graft slightly spatulated and sewn end-to-side to the artery using continuous 6-0 Prolene suture.  The graft was then pulled to the appropriate length for anastomosis to the high brachial vein.  The vein was spatulated.  The graft was cut to  the appropriate length and spatulated.  The two were sewn end-to-end using continuous 6-0 Prolene suture.  At the completion, there was a good thrill in the graft and a radial signal with the Doppler.  Hemostasis was obtained in the wounds and the wounds were closed with a deep layer of 3-0 Vicryl.  The skin was closed with 4-0 subcuticular stitch.  Sterile dressing was applied.  The patient tolerated the procedure well and was transferred to the recovery room in satisfactory condition.  All needle and sponge counts were correct. Dictated by:   Judeth Cornfield Scot Dock, M.D. Attending Physician:  Dorothea Glassman DD:  08/15/01 TD:  08/17/01 Job: [id number]

## 2010-07-31 NOTE — Op Note (Signed)
[name], [name]                 ACCOUNT NO.:  [id number]   MEDICAL RECORD NO.:  [id number]          PATIENT TYPE:  AMB   LOCATION:  SDS                          FACILITY:  [place]   PHYSICIAN:  Dorothea Glassman, M.D.    DATE OF BIRTH:  02-Jun-1985   DATE OF PROCEDURE:  05/26/2005  DATE OF DISCHARGE:  05/26/2005                                 OPERATIVE REPORT   SURGEON:  Gordy Clement, MD   ASSISTANT:  Richardson Dopp, P.P.   ANESTHETIC:  Local with MAC.   ANESTHESIOLOGIST:  Finis Bud, M.D.   PREOPERATIVE DIAGNOSES:  1.  End-stage renal failure.  2.  Clotted right upper arm arteriovenous graft.   POSTOPERATIVE DIAGNOSES:  1.  End-stage renal failure.  2.  Clotted right upper arm arteriovenous graft.   PROCEDURE:  Thrombectomy and revision, right upper arm arteriovenous graft.   OPERATIVE PROCEDURE:  The patient was brought to the operating room in  stable condition.  Placed in supine position.  Right arm prepped and draped  in q sterile fashion.   The skin and subcutaneous tissue instilled with 1% Xylocaine with  epinephrine.  A longitudinal skin incision made through the scar in the  right axilla.  Dissection carried down through the subcutaneous tissue with  electrocautery.  The venous limb of the graft mobilized.  This was followed  down to an end-to-end anastomosis to the axillary vein.  The vein was  mobilized proximally and controlled with a Gregory clamp.  The venous  anastomosis was taken down and the vein trimmed proximally.  There was  moderate pseudointimal narrowing in the vein.  The graft thrombectomized  several times with a 4 Fogarty catheter.  Excellent flow obtained.  The  graft filled heparin and saline solution and controlled with a fistula  clamp.  A 6-mm Gore-Tex graft was then anastomosed end-to-end to the divided  graft using running 6-0 Prolene suture.  This was then anastomosed end-to-  end to the vein using running 6-0 Prolene suture.   Clamps were then removed.  Excellent flow present.  Adequate hemostasis obtained.  Sponge and  instrument counts correct.   Subcutaneous tissue closed with running 3-0 Vicryl suture.  Skin closed with  4-0 Monocryl.  Steri-Strips applied.   The patient tolerated the procedure well.  No apparent complications.  Transferred to recovery in stable condition.      Dorothea Glassman, M.D.  Electronically Signed     PGH/MEDQ  D:  05/26/2005  T:  05/27/2005  Job:  [id number]

## 2010-07-31 NOTE — Op Note (Signed)
[place]. [facility]  Patient:    [name], [name] Visit Number: [id number] MRN: [id number]          Service Type: DSU Location: [facility] 2899 03 Attending Physician:  Mechele Collin Dictated by:   Mechele Collin, M.D. Proc. Date: 01/14/01 Admit Date:  01/14/2001 Discharge Date: 01/14/2001                             Operative Report  PREOPERATIVE DIAGNOSES: 1. End-stage renal disease. 2. Thrombosed left upper extremity arteriovenous graft.  POSTOPERATIVE DIAGNOSES: 1. End-stage renal disease. 2. Thrombosed left upper extremity arteriovenous graft.  PROCEDURE:  Thrombectomy and revision of distal venous anastomosis (6 mm polytetrafluoroethylene interposition graft).  SURGEON:  Mechele Collin, M.D.  ANESTHESIA:  General endotracheal.  ESTIMATED BLOOD LOSS:  20 cc.  DRAINS:  None.  SPECIMENS:  None.  COMPLICATIONS:  None.  BRIEF HISTORY:  This is a 25 year old black female with end-stage renal disease since age 1.  She has had a left upper extremity upper arm AV graft for approximately one year, and this has been revised one time over the last three months.  Today she presented to her dialysis center with a new acute thrombosis.  She was scheduled for elective thrombectomy and possible revision.  The risks, benefits, and alternatives to the procedure were explained in detail to the patient and the patients mother, and they agreed to proceed.  DESCRIPTION OF PROCEDURE:  The patient was brought in the operating room and placed on the operating table in the supine position with the left arm extended on an arm board.  Following adequate general endotracheal anesthesia, the left arm was draped and prepped circumferentially from the axilla to the hand.  A longitudinal incision was made in the axilla encompassing an existing scar and extended proximally for 3 cm.  The wound was deepened using the Bovie to control bleeding.  The axillary vein at the distal  venous anastomosis was identified, dissected circumferentially, and mobilized proximally in the axilla.  The vein was encircled with a vessel loop.  Next, the distal portion of the graft just before the anastomosis was also dissected circumferentially and controlled with a vessel loop.  A transverse graftotomy was performed and the patient was then systemically heparinized.  The venous outflow tract was thrombectomized with the return of well-organized thrombus, followed by good backbleeding.  The venous outflow tract was then infused with heparin-saline solution and clamped.  Next, the arterial limb of the graft was then thrombectomized with return of excellent inflow.  A fistula clamp was then placed.  Next, the graft was transected at the level of the graftotomy and resected distally, encompassing the distal venous anastomosis and the first centimeter of the axillary vein.  Distal to this the vein was of good caliber and quality.  The transected vein was then spatulated.  A 6 mm PTFE graft segment was then selected, spatulated, and sewn in position in an end-to-end fashion with a running 6-0 Prolene suture.  Following completion of the anastomosis, a clamp was placed on the graft proximal to the anastomosis and there was no significant bleeding from the anastomotic suture line.  Next, the graft was trimmed to the appropriate length and then sewn into position in an end-to-end fashion with the old segment of PTFE with a running 6-0 Prolene suture.  Prior to completion of the anastomosis, the graft was flushed and backbled.  The  anastomosis was then completed and the clamps were released, restoring flow.  There was an excellent thrill in the graft at this point. Some needle hole bleeding was controlled with direct pressure and Surgicel. Protamine 20 mg was administered.  Next, the wound was irrigated copiously with warm sterile saline and hemostasis was assured, and then the wound  was closed in layers with 3-0 and 4-0 Vicryl suture.  Sterile dry dressings were applied.  At this point the patient had an excellent thrill in the graft and palpable radial pulse.  Next, the right groin Quinton catheter that had been placed for her dialysis needs earlier today was then removed, and direct pressure was held until hemostasis achieved.  The patient was then awakened from anesthesia and transferred to the recovery room in stable condition.  The patient tolerated the procedure well.  There were no complications.  All needle and sponge counts were correct. Dictated by:   Mechele Collin, M.D. Attending Physician:  Mechele Collin. DD:  01/14/01 TD:  01/16/01 Job: [id number]

## 2010-07-31 NOTE — Discharge Summary (Signed)
[place]. [facility]  Patient:    [name], [name]                        MRN: [id number] Adm. Date:  [id number] Disc. Date: [id number] Attending:  Franchot Erichsen Dictator:   Jonah Blue, M.D.                           Discharge Summary  No dictation. DD:  06/23/99 TD:  06/24/99 Job: [id number]

## 2010-07-31 NOTE — Discharge Summary (Signed)
[place]. [facility]  Patient:    [name], [name]                        MRN: [id number] Adm. Date:  [id number] Disc. Date: [id number] Attending:  Dennis Bast Dictator:   Monia Pouch, M.D. CC:         [place] Lorrene Reid, M.D.             Darene Lamer. Hoxworth, M.D.             [facility]                           Discharge Summary  DATE OF BIRTH:  02-01-1986  CONSULTATIONS: 1. Darene Lamer. Hoxworth, M.D. - General surgery. 2. CVTS.  DISCHARGE DIAGNOSES:  1. Peritonitis with Acetobacter sensitive to Fortaz and ciprofloxacin.  2. 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  3. Iron deficiency anemia.  Hemoglobin 7.5 on June 19, 1999.  Discharge     hemoglobin 7.9.  4. History of noncompliance.  5. 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  6. Status post removal of peritoneal dialysis catheter.  7. Status post placement of right internal jugular Ash catheter.  8. 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  9. Hypertension. 10. Depression.  DISCHARGE MEDICATIONS:  1. Nephro-Vite, one tablet p.o. q.d.  2. Ferrous sulfate 325 mg, one tablet t.i.d. between meals.  3. Os-Cal 1200 mg p.o. t.i.d. q.a.c.  4. Ciprofloxacin 500 mg p.o. q.d. x 10 days.  5. Diflucan 150 mg p.o. q.o.d. x 10 days (a total of five treatments).  6. Labetalol 200 mg p.o. b.i.d.  7. Dulcolax 10 mg p.o. p.r.n. constipation.  8. Sorbitol 30 cc p.o. b.i.d. p.r.n. constipation.  9. Aspirin 325 mg p.o. q.d. 10. Darvocet-N 100, 1-2 pills q.4-6h. p.r.n. pain.  The patient was dispensed     30 pills with one refill.  DISCHARGE DIALYSIS ORDERS:  The patient is to have dialysis on  Tuesdays, Thursdays, and Saturdays at 12:00 noon at the [facility]. Initial dialysis orders as follows - S80, blood flow rate of 250, dialysis flow rate 800, length of time three hours, 2K potassium bath, removal of 2 L, tight heparin and an i-STAT 3.  The patient will need a follow-up hemoglobin and hematocrit for each hemodialysis x 3.  The patient is also to receive 10,000 units of Epogen at each hemodialysis.  PROCEDURES: 1. Status post removal of peritoneal dialysis catheter on July 01, 1999. 2. Placement of a right internal jugular Ash catheter on July 01, 1999.  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  She has had several incidents of peritonitis due to her peritoneal dialysis catheter.  Most recently, she was treated with IV vancomycin and IV Tressie Ellis as well as IP Fortaz for Acetobacter, coag negative Staphylococcus aureus and rare yeast. She was admitted on June 30, 1999 for recurrent peritonitis and incomplete draining of  peritoneal dialysis fluid.  Please see the admission history and physical for full complete details.  HOSPITAL COURSE: PROBLEM #1 - PERITONITIS:  The patient was admitted to 5500, where she was treated with IV Fortaz.  The patient also had blood cultures and peritoneal dialysis fluid cultured.  The patients peritoneal dialysis catheter tip revealed greater than 100 colonies of yeast that was not Candida or Streptococcus.  The patient was also treated with ciprofloxacin and Diflucan during her hospital course.  The patient remained hemodynamically stable throughout her hospital course and was having a decrease in her temperature curve at time of discharge.  The patient also underwent removal of her peritoneal dialysis catheter by Dr. Excell Seltzer on hospital day #2.  The patient tolerated  the procedure well and without any complications.  The patient will be treated for ten more days with Diflucan and ciprofloxacin for her peritonitis.  The patient will have follow-up at the dialysis center.  PROBLEM #2 - CHRONIC TUNNEL INFECTION:  The patient had been treated with ciprofloxacin for quite sometime for Pseudomonas aeruginosa infection of her peritoneal dialysis catheter site.  The patient was no longer having trouble with a tunnel infection after removal of the catheter.  As mentioned earlier, the patient was continued on ciprofloxacin for her ciprofloxacin sensitive Acetobacter.  PROBLEM #3 - END-STAGE RENAL DISEASE:  The patient had been treated with peritoneal dialysis in the past.  Given all of the complications, the peritoneal dialysis catheter has been removed.  The patient had an Ash catheter placed on hospital day #2 and underwent hemodialysis that evening. The patient tolerated hemodialysis very well throughout the hospital course. The patient is to continue hemodialysis as an outpatient at the [facility] on Tuesdays, Thurdays and Saturdays at 12:00 p.m.  PROBLEM #4 - HYPERTENSION:  The patient was continued on labetalol throughout her hospital course.  However, Procardia was discontinued after the initiation of hemodialysis.  Blood pressure was controlled very well after the initiation of hemodialysis, and her discharge blood pressure is 131/85.  PROBLEM #5 - ANEMIA:  The patient has anemia that is multifactorial from her end stage renal disease as well as iron deficiency.  On admission, she was found to have a hemoglobin of 5.4, but was hemodynamically stable.  A recheck of her hemoglobin revealed a value of 6.9.  The patient underwent a transfusion of two units of packed red blood cells.  Given her status for potential future kidney transplant, she was given leuko-poor packed red blood cells.  The patients hemoglobin increased at this  transfusion to 9.2.  Over the hospital course, she had a general trend downward of her hemoglobin.  The  patients hemoglobin at the time of discharge was 7.9, but she remained hemodynamically stable.  The patient was also treated with p.o. iron as well as Epogen.  The patient will need close follow-up of her hemoglobin on an outpatient basis.  She is to have a follow-up hemoglobin for the next three hemodialysis.  PROBLEM #6 - MALNUTRITION:  The patient has an albumin level of less than 1.5. Nutrition consult was obtained during her hospital course, and she was encouraged to consume ______ t.i.d.  The patient was also started on Nephro-Vite.  DISPOSITION:  The patient was discharged to home, where she will be living with her sister here in [place].  As mentioned above, the patient will be followed up at the [facility]. DD:  07/04/99 TD:  07/06/99 Job: [id number]

## 2010-07-31 NOTE — Discharge Summary (Signed)
[place]. [facility]  Patient:    [name], [name]                        MRN: [id number] Adm. Date:  [id number] Disc. Date: [id number] Attending:  Franchot Erichsen Dictator:   Jonah Blue, M.D. CC:         [facility] Lorrene Reid, M.D.             [place] on [address]             Lauro Regulus, M.D.                           Discharge Summary  ADMISSION DIAGNOSES:  1. Peritonitis.  2. End-stage renal disease, on peritoneal dialysis.  3. Chronic tunnel infection with Pseudomonas, treated with p.o. Cipro.  3. Solitary kidney with focal segmental glomerulosclerosis.  4. History of multiple peritonitis infections.  5. History of iron deficiency anemia.  6. Hypotension.  DISCHARGE DIAGNOSES:  1. Peritonitis with Acinetobacter sensitive to Ceftaz, also with coagulase     negative Staph pan sensitive.  Patient treated with Ceftaz, p.o. Cipro,     and intravenous vancomycin.  2. Continued chronic tunnel infection with Pseudomonas aeruginosa.  3. Anemia.  Discharge hemoglobin 7.5.  4. Hyperphosphatemia.  5. Hypotension, resolved, now with chronic hypertension.  6. Constipation, currently resolved.  7. Poor compliance.  8. History of end-stage renal disease secondary to solitary kidney with focal     segmental glomerulosclerosis, continued on peritoneal dialysis.  9. History of karyotype 80 XXX. 10. History of learning disability. 11. History of absent uterus and absent right ovary, with small left ovary,     secondary to chromosomal abnormality.  DISCHARGE MEDICATIONS:  1. Procardia XL 60 mg p.o. bid  2. Cipro 500 mg p.o. q.d. x 2 weeks.  3. Labetalol 200 mg p.o. b.i.d.  4. Nu-Iron 150 mg p.o. t.i.d.  5. Calcium carbonate 500 mg 2 tablets p.o. t.i.d.  6. Rocaltrol 0.5 mcg 1 tablet q.d.  7. Epogen 10,000 units subQ each Monday, Wednesday, and Friday.  8. Aspirin q.d.  9. Nephro-Vite. 10. Diflucan  100 mg every other day x 2 weeks. 11. Dulcolax 10 mg p.o. q.d. to b.i.d. p.r.n. 12. Sorbitol 70% 30 cc q.6h as-needed for constipation. 13. Ceftazidime 1500 mg intraperitoneal q.d. 14. InFeD 150 mg IV q.d. at home training.  SPECIAL INSTRUCTIONS:  1. The patient is to continue peritoneal dialysis with a goal weight of 120     pounds.  She may adjust her percentage as-needed.  She is to do six     exchanges daily.  She is to receive ceftazidime intraperitoneal q.d. at     home training.  2. The patient is to receive InFeD 250 mg IV x 2 through home training as     well.  3. The patient has an appointment with Dr. Alvan Dame at [facility]     on July 02, 1999 at 11:30 a.m.  4. The patient should have an outpatient appointment start up at [facility]     [facility] for possible transfer of care or consideration for     transplant.  This will be set up through [employer].  HISTORY OF PRESENT ILLNESS: [name] is a 25 year old female with a history of solitary kidney, focal segmental glomerulosclerosis, on chronic peritoneal dialysis.  She has  had multiple bouts of peritonitis as well as a chronic tunnel infection with Pseudomonas.  She was admitted on June 15, 1999 for increasing abdominal pain and cloudiness in her peritoneal fluid.  It was noticed two days prior to admission that the patient did have a hole in the catheter that had been replaced.  HOSPITAL COURSE: She was admitted and placed on IV vancomycin and ceftazidime. #1 - PERITONITIS: The patient was originally treated with IV ceftazidime and IV vancomycin.  The IV vancomycin was continued through discharge for a total of eight days.  Upon discharge she remained therapeutic secondary to her renal disease.  No further doses were given.  On hospital day #4 she was changed from IV ceftazidime to intraperitoneal ceftazidime.  Her cultures from [facility] grew out Acinetobacter sensitive to  ceftazidime as well as a coagulase negative Staph sensitive to vancomycin.  Repeat cultures upon arriving at the [facility] ER only grew acinetobacter.  The patients cell counts trended down throughout her hospital stay and on the date of discharge her counts were 40.  It was also noted on the day of discharge that her fluid did have rare yeast and for that reason she was discharged on Diflucan.  Throughout the patients hospital stay she had low-grade fevers until approximately 48 hours prior to discharge.  She had clearing of her fluid but did have some fibrin deposits, which made peritoneal dialysis difficult.  This was resolved with heparin.  Upon discharge the patient will be continued on ceftazidime 1500 mg intraperitoneal q.d. through home training.  She will also be continued on Cipro 500 mg q.d. x 2 weeks and Diflucan 100 mg p.o. every other day x 2 weeks.  She is to return to home training daily for her antibiotics.  She has hospital follow-up with Dr. Alvan Dame on July 02, 1999.  If she has any continued problems she is to return sooner.  Of note, it was consistently recommended that the patient have her peritoneal dialysis catheter removed.  She and her mother refused and we were unable to do that at this time.  The patient continues to have chronic tunnel infection from Pseudomonas and this was present on admission as well.  Again it was stressed that the patient needs to have the catheter removed but she did not allow [place] to do this.  It is suggested that the patient be set up through [place] for possible second opinion and transplant consideration at [facility] [place] upon discharge.  #2 - CHRONIC TUNNEL INFECTION: The patient continues to have chronic tunnel infection with Pseudomonas.  Cultures were obtained in the ER upon admission and the patient did have Pseudomonas that was sensitive to Cipro.  She will be continued on Cipro 500 mg  q.d. x 2 weeks and consideration should be thought about for continued Cipro at follow-up.   #3 - ANEMIA: It was noted upon the patients admission that her hemoglobin was 10.0.  Throughout her hospital stay the patients hemoglobin trended down to a value of 7.2.  It was suggested that the patient receive blood transfusion but this was refused by the family.  At that time the patient was placed on InFeD 100 mg IV q.24h and she received five doses of this prior to discharge.  She will receive 250 mg IV x 2 at home training.  Originally the patient did have some tachycardia without orthostasis.  This had resolved.  Discharge hemoglobin was 7.5. A recheck CBC  may be suggested at follow-up.  #4 - HYPOTENSION: The patient did have hypotension and tachycardia upon admission.  It was thought that this was likely secondary to fever and infection.  Blood cultures were negative for sepsis.  The patient was given IV fluids for the first two days of her admission and blood pressures continued to trend upward.  On hospital day #5 blood pressure medications were restarted and the patient developed hypertension.  She does have a baseline chronic hypertension treated with labetalol and Procardia and Altace.  Her labetalol and Procardia were restarted.  Altace still has not been restarted and consideration should be thought about to restart this at follow-up.  #5 - HISTORY OF HYPERTENSION: Again, the patient was restarted on Procardia and labetalol.  Altace has not yet been restarted.  #6 - CONSTIPATION: The patient had some constipation while hospitalized.  She had relief with Dulcolax and Sorbitol.  She will be discharged on Dulcolax and Sorbitol.  #7 - HYPERPHOSPHATEMIA: The patient did develop hyperphosphatemia with discharge phosphate of 7.3.  It was stressed that the patient needs to take her Rocaltrol and calcium at discharge.  She will be discharged on calcium carbonate 500 mg 2 tablets t.i.d.  as well as her daily Rocaltrol.  She was given Benadryl for itching.  #8 - NONCOMPLIANCE ISSUES: Again it was stressed throughout the patients hospitalization that she needs to have her peritoneal dialysis catheter removed.  The patient refused to have this done.  We did stress that the patient is likely to have continued bouts with peritonitis and tunnel infection and the patient is aware that this may be a complication.  It is suggested that the patient receive a second opinion from [place], [place] and possible placement on the transplant list.  There are some questions whether or not Kelin is taking all of her medications as directed and we did stress this upon discharge as well.  DISCHARGE DISPOSITION: [name] was discharged in stable condition.  She was taking a regular renal diet without difficulty.  She did not have diarrhea or constipation.  She had resolved belly pain as well as had been afebrile for the past 48 hours.  Again, she will have follow-up with Dr. Alvan Dame on July 02, 1999 and will present to home training daily for her ceftazidime.  DISCHARGE LABORATORY DATA: Peritoneal fluid cultures with acinetobacter and coagulase negative Staph.  Tunnel cultures with Pseudomonas aeruginosa.  Blood cultures negative.  Hemoglobin 7.4.  BUN 52, creatinine 12.6.  Phosphorus 7.3. Cell count 40 with rare yeast. DD:  06/23/99 TD:  06/24/99 Job: [id number]

## 2010-07-31 NOTE — Op Note (Signed)
[place]. [facility]  Patient:    [name], [name]                        MRN: [id number] Proc. Date: 07/14/99 Adm. Date:  [id number] Attending:  Lance Sell CC:         Joyice Faster. Deterding, M.D.                           Operative Report  PREOPERATIVE DIAGNOSIS:  Acute and chronic peritonitis with multiple abdominal nd pelvic abscesses.  POSTOPERATIVE DIAGNOSIS:  Acute and chronic peritonitis with multiple abdominal and pelvic abscesses.  PROCEDURE PERFORMED:  Exploratory laparotomy, debridement and drainage of multiple abdominal and pelvic abscesses.  SURGEON:  Edsel Petrin. Dalbert Batman, M.D. DD:  07/14/99 TD:  07/16/99 Job: [id number]

## 2010-07-31 NOTE — Op Note (Signed)
[name], ARNELL NO.:  [id number]   MEDICAL RECORD NO.:  [id number]          PATIENT TYPE:  AMB   LOCATION:  SDS                          FACILITY:  [place]   PHYSICIAN:  Rosetta Posner, M.D.    DATE OF BIRTH:  [birth year]   DATE OF PROCEDURE:  12/21/2004  DATE OF DISCHARGE:                                 OPERATIVE REPORT   PREOPERATIVE DIAGNOSIS:  End-stage renal disease with occluded right upper  arm AV Gore-Tex graft.   POSTOPERATIVE DIAGNOSIS:  End-stage renal disease with occluded right upper  arm AV Gore-Tex graft.   PROCEDURE:  Thrombectomy and interpositional graft revision of right upper  arm AV Gore-Tex graft.   SURGEON:  Rosetta Posner, MD.   ASSISTANT:  Doroteo Bradford, PA-C.   ANESTHESIA:  MAC.   COMPLICATIONS:  None.   DISPOSITION:  To recovery room, stable.   PROCEDURE IN DETAIL:  The patient was taken to the operating room and placed  in the supine position where the area of the right arm was prepped and  draped in the usual sterile fashion.  An incision was made over the prior  axillary incision and carried down to isolate the graft to vein anastomosis.  The graft was opened near the venous anastomosis.  There was a tight  stenosis at the venous anastomosis.  The vein above this appeared to be of  good caliber.  This was flushed with heparinized saline and reoccluded.  The  graft itself was thrombectomized.  There was some aneurysmal degeneration of  the graft, but no evidence of the stenosis throughout these areas.  There  was a tight stenosis in the graft itself near the prior axillary incision.  A separate incision was made using local anesthesia over the graft proximal  to the area of stenosis in the graft itself.  A new interposition graft of 7-  mm Gore-Tex was brought onto the field, was sewn end-to-end to the old graft  with a running 6-0 Prolene suture, and then end-to-end to the vein above the  old stenosis and old  anastomosis with a running 6-0 Prolene suture.  The  clamp was removed, and an excellent thrill was noted.  The wound was  irrigated with saline.  Hemostasis was achieved with electrocautery.  The  wounds were closed with 3-0 Vicryl in the subcutaneous and subcuticular  tissue, and mini Steri-Strips were applied.      Rosetta Posner, M.D.  Electronically Signed     TFE/MEDQ  D:  12/21/2004  T:  12/21/2004  Job:  [id number]

## 2010-07-31 NOTE — Op Note (Signed)
[place]  Patient:    [name], [name]                        MRN: [id number] Proc. Date: 10/13/00 Adm. Date:  [id number] Disc. Date: [id number] Attending:  Dorothea Glassman                           Operative Report  PREOPERATIVE DIAGNOSIS:  Clotted left upper arm arteriovenous graft.  POSTOPERATIVE DIAGNOSIS:  Clotted left upper arm arteriovenous graft.  PROCEDURES: 1. Thrombectomy of left upper arm arteriovenous graft. 2. Insertion of new segment of graft into the more proximal axillary vein.  SURGEON:  Judeth Cornfield. Scot Dock, M.D.  ASSISTANT:  Earnstine Regal, P.A.  ANESTHESIA:  Local with sedation.  DESCRIPTION OF PROCEDURE:  The patient was taken to the operating room and sedated by anesthesia.  The left upper extremity was prepped and draped in the usual sterile fashion.  After the skin was anesthetized with 1% lidocaine, the incision on the venous anastomosis was opened and the venous limb of the graft dissected free.  The graft was divided after the patient was heparinized. Graft thrombectomy was achieved using a #4 Fogarty catheter.  The arterial plug was retrieved and excellent inflow established, and the graft was flushed with heparinized saline and clamped.  The venous anastomosis was excised.  The more proximal axillary vein was spatulated.  A segment of 7 mm PTFE graft was spatulated and sewn end-to-end to the spatulated axillary vein using continuous 6-0 Prolene suture.  The graft was then pulled to the appropriate length for anastomosis to the old graft.  This was done end-to-end using continuous 6-0 PTFE suture.  At the completion there was an excellent thrill in the graft.  Hemostasis was obtained in the wound.  The wound was closed with a deep layer of 3-0 Vicryl and the skin closed with 4-0 Vicryl.  Sterile dressing was applied.  The patient tolerated the procedure well and was transferred to the recovery room in  satisfactory condition.  All needle and sponge counts were correct. DD:  10/13/00 TD:  10/14/00 Job: [id number]

## 2010-07-31 NOTE — Op Note (Signed)
NAMEBERNINA, EDMISTER NO.:  [id number]   MEDICAL RECORD NO.:  [id number]          PATIENT TYPE:  OIB   LOCATION:  2852                         FACILITY:  [facility]   PHYSICIAN:  Dorothea Glassman, M.D.    DATE OF BIRTH:  1985/05/07   DATE OF PROCEDURE:  10/24/2004  DATE OF DISCHARGE:                                 OPERATIVE REPORT   SURGEON:  Gordy Clement, MD.   ASSISTANT:  Doroteo Bradford, PA.   ANESTHETIC:  Local with MAC.   ANESTHESIOLOGISTGlennon Mac.   PREOPERATIVE DIAGNOSES:  1.  End-stage renal failure.  2.  Clotted left upper arm arteriovenous graft.   POSTOPERATIVE DIAGNOSES:  1.  End-stage renal failure.  2.  Clotted left upper arm arteriovenous graft.   PROCEDURE:  Thrombectomy and revision, left upper arm arteriovenous graft.   OPERATIVE PROCEDURE:  The patient brought to the operating room in stable  condition. Placed in the supine position. Left arm prepped and draped in  sterile fashion.   Skin and subcutaneous tissue instilled with 1% Xylocaine with epinephrine.  Longitudinal skin incision made through the left axilla. Dissection carried  down through the subcutaneous tissue with electrocautery.  The venous limb  of the graft identified. The anastomosis exposed. The axillary vein  mobilized proximally and controlled with a Gregory clamp. The vein divided  and beveled. A 4-Fogarty catheter was passed up the vein, with return of a  small amount of thrombus; and the vein flushed distally with heparin saline  solution. The vein controlled with a Gregory clamp. The Fogarty catheter was  then passed down the Gore-Tex graft several times. There was some  degeneration in the Gore-Tex graft with dilatation. Excellent in-flow was  obtained. The graft controlled proximally with a fistula clamp. The patient  administered 3000 units of heparin intravenously. A new segment of 6 mm Gore-  Tex was anastomosed end-to-end to divided graft using running 6-0  Prolene  suture.   Upon flushing, there was poor flow. The graft was therefore reopened and  Fogarty passed down the graft. However, it could not be passed entirely  through the length of the graft.   The arterial anastomosis was therefore exposed. A longitudinal incision made  over the artery. Dissection carried down through subcutaneous tissue. The  arterial anastomosis exposed. With the arterial anastomosis exposed, the  Fogarty could be passed across the arterial anastomosis. Further thrombus  returned. Excellent in-flow obtained. The graft controlled with a fistula  clamp.   The venous anastomosis was then completed using running 6-0 Prolene suture  in an end-to-end fashion. Clamps were then removed. Excellent flow present.  Adequate hemostasis obtained. Sponge and instrument count was correct.   Subcutaneous tissue closed with running 3-0 Vicryl suture. Skin closed with  4-0 Monocryl.  Steri-Strips applied. The patient tolerated the procedure  well. Transferred to recovery room in stable condition.      Dorothea Glassman, M.D.  Electronically Signed     PGH/MEDQ  D:  10/24/2004  T:  10/24/2004  Job:  [id number]

## 2010-07-31 NOTE — Discharge Summary (Signed)
[place]  Patient:    [name], [name]                        MRN: [id number] Adm. Date:  [id number] Disc. Date: [id number] Attending:  [name] Dictator:   Myriam Jacobson, P.A.C. CC:         [facility]   Discharge Summary  DISCHARGE DIAGNOSES: 1. Yeast and Enterobacter cloacae sepsis. 2. Right lower lung pneumonia. 3. End-stage renal disease on chronic hemodialysis. 4. Hypertension. 5. Anemia.  PROCEDURES: 1. Hemodialysis. 2. Removal of right IJ Ash catheter January 02, 2000, Dr. Deitra Mayo. 3. Placement of left IJ Ash catheter, Dr. Nell Range.  HISTORY OF PRESENT ILLNESS:  The patient is a 25 year old black female with end-stage renal disease secondary to ______ on chronic Tuesday, Thursday, Saturday dialysis at the [facility].  Her medical history is also complicated by hypertension, severe refractory peritonitis in spring of 2001 at which time she also lost her PD catheter, having had multiple intra-abdominal abscesses requiring multiple drains from April through June. She had recurrent intrapelvic October 15, 1999, which required drainage and was treated with Augmentin at that time.  The patient had been in her usual state of health until her dialysis treatment on October 18 at which time she spiked a temperature with chills.  Blood culture were drawn at that time, and she was treated empirically with vancomycin and tobramycin.  She was seen in the emergency room after hemodialysis, and chest x-ray showed right lung consolidation with questionable pneumonitis.  This was treated with a Z-Pak. She was coughing clear mucus at that time which had changed to yellow phlegm for greater than one week.  On October 20 at dialysis, her temperature was in the 99 range pre dialysis and spiked to 103 with chills.  Heart rate increased to the 160s.  She was again dosed with vancomycin and tobramycin, and  it was found that all four bottles from October 18 were grown gram-negative rods. The patient was admitted to rule out intra-abdominal process.  Her chest x-ray confirmed persistent right lower lobe infiltrate.  She had no nausea, vomiting, abdominal pain.  No shortness of breath and no dysuria.  HOSPITAL COURSE:  In the emergency room, her temperature was 102.6 with rigors.  Blood pressure 120/60, heart rate in 102s.  Admission labs showed a sodium 137, potassium 3.4, total bilirubin 1.6, chloride 98, CO2 31, BUN 12, creatinine 3.6, glucose 81, calcium 8.5, phosphorus 2.4.  White count 3.8, hemoglobin 12.5, hematocrit 38.9, platelets 50,000, 91% segs.  Liver function tests within normal limits.  The patient was admitted for removal of her catheter and further intervention and treatment.  #1 - YEAST AND ENTEROBACTER CLOACAE SEPSIS:  Further evaluation of blood cultures drawn at the kidney center showed that those drawn October 16 were growing yeast in the aerobic bottles and 4 out of the 4 on October 18 were growing gram-negative rods.  Tobramycin was changed to Tequin.  She as continued on vancomycin, and Diflucan was added.  Her catheter was removed by Dr. Scot Dock on October 20.  Within 24 hours, she was dramatically improved. Her leukopenia resolved and was probably a post hemodialysis effect.  Repeat white count was now 19,900 and gradually decreased as her condition improved during her hospitalization.  Repeat platelets showed an increase to 76,000, and they continued to improve during her hospitalization as well with platelets  at the time of discharge up to 160,000.  Her blood pressure was controlled with the use of labetalol and Norvasc.  She did run some low-grade temperatures, but eventually, at the time of discharge, these were gone, and her white count was down to 10,800.  A new Ash catheter was placed by radiology on October 24, and she tolerated it without any problems.   Hemoglobin decreased slightly to 10.6.  Epogen was increased slightly as well.  The patient was able to be discharged home on October 25 after dialysis.  She will need a permanent vascular access once she has negative blood cultures at the completion of her antibiotic therapy.  CONDITION UPON DISCHARGE:  Improved.  DISCHARGE MEDICATIONS: 1. Nephro-Vite 1 tablet per day. 2. Os-Cal 500 mg 1 tablet 3 times a day with meals. 3. Norvasc 5 mg per day. 4. Labetalol 300 mg b.i.d. 5. Tequin 200 mg per day. 6. Diflucan 100 mg per day through November 3.  This will complete a    two-week course after the catheter was removed.  DISCHARGE DIET:  Renal diet as previously instructed.  Limit fluid to 5 cups per day.  DISPOSITION:  The patient is to have a followup chest x-ray in two weeks to follow up on her right lower lobe infiltrate.  She is to have surveillance blood cultures drawn on November 9.  She will continue Tuesday, Thursday, Saturday dialysis at [facility].  Epogen dose has been increased to 10,000, and her estimated dry weight is the same. DD:  02/17/00 TD:  02/17/00 Job: [id number]

## 2010-07-31 NOTE — Op Note (Signed)
. [facility]  Patient:    [name], [name]                        MRN: [id number] Proc. Date: 07/01/99 Adm. Date:  [id number] Attending:  Dennis Bast CC:         Maudie Flakes. Hassell Done, M.D.                           Operative Report  PROCEDURE:  Explantation of peritoneal dialysis catheter.  SURGEON:  Isabel Caprice. Hassell Done, M.D.  ANESTHESIA:  General.  DESCRIPTION OF PROCEDURE:  The patient was taken back to room #15 and Dr. Donnetta Hutching and I worked together.  He had already put in a hemodialysis catheter in her neck. THe abdomen had been prepped and draped sterilely.  The cuff system had the second uff exiting out through the skin.  It was visible and I was able to pull and remove  that.  I could feel the other cuff just slightly to the right of midline.  I made a small transverse incision over that and cut down and identified the cuff.  I encountered some purulence.  I went ahead and mobilized the cuff and then explanted it from the abdomen and cultured the entire tip.  I sent that for examination. The hole in the fascia was then irrigated and closed with a single 0 Vicryl.  I did try to remove the Prolene suture that was holding it in place.  I then washed the subcutaneous tissue out with some Betadine and approximated that with 3-0 nylon  vertical mattress sutures.  The remaining portion of the tract was left open to  drain out laterally.  The patient seemed to tolerate the procedure well.  She was taken to the recovery room in satisfactory condition.  She will have hemodialysis tonight. DD:  07/01/99 TD:  07/02/99 Job: [id number]

## 2010-07-31 NOTE — H&P (Signed)
. [facility]  Patient:    [name], [name]                        MRN: [id number] Adm. Date:  06/30/99 Attending:  Duane Lope. Mcarthur Rossetti, M.D. Dictator:   Monia Pouch, M.D.                         History and Physical  DATE OF BIRTH:  08-Mar-1986.  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  [name] has been on peritoneal dialysis for the  past three years and has a history of recurrent peritonitis.  She was discharged from [facility] on June 23, 1999 after being treated for yet another episode of peritonitis.  She was initially treated with IV Vancomycin and IV Fortaz for Acinetobacter, coag negative staph, rare yeast peritonitis.  During that admission, her family refused to have the peritoneal dialysis catheter removed despite out recommendations.  Today, she presents to [facility] with chief complaint of abdominal pain and back pain.  She denies any nausea, vomiting, fever, chills, night sweats, diarrhea, or constipation.  The patient and mom both stated that she had noticed decrease of peritoneal dialysis flow in and out of her peritoneum for the past week.  The flow started to decrease and it was almost absent yesterday.  They also stated that she had initially been well after discharge to home on June 23, 1999.  REVIEW OF SYSTEMS:  Otherwise negative except for occasional headache and recent anorexia.  PAST MEDICAL HISTORY: 1. Peritonitis (recurrent), most recent June 15, 1999.  Cultures positive for    Fortaz sensitive Acinetobacter, coag negative staph that was pansensitive, and    rare yeast.  Treated with IV and peritoneal Fortaz, IV Vancomycin x 8 days, nd    p.o. Diflucan. 2.  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 3. Iron deficiency anemia.  Hemoglobin was 7.5 on June 19, 1999. 4. Noncompliance. 5. End-stage renal disease, secondary to solitary kidney with focal segmental    glomerulosclerosis.  She has been on peritoneal dialysis for three years. 6. 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 7. Hypertension.  MEDICATIONS:  1. Procardia XL 60 mg p.o. b.i.d.  2. Labetalol 200 mg p.o. b.i.d.  3. Nu-Iron 150 mg t.i.d.  4. Calcium carbonate 1000 mg p.o. t.i.d. q. a.c.  5. Rocaltrol 0.5 mcg p.o. q.d.  6. Epogen 10,000 units subq Monday, Wednesday, Friday.  7. Nephro-Vite one tablet p.o. q.d.  8. Dulcolax 10 mg p.r.n.  9. Sorbitol p.r.n. 10. InFeD 150 mg IV q.d. 11. Fortaz 1500 mg IP q.d. 12. Cipro 500 mg p.o. q.d. 13. Diflucan 100 mg p.o. q.d. 14. Aspirin 325 mg p.o. q.d.  ALLERGIES:  No known drug allergies.  FAMILY HISTORY:  Noncontributory.  No history of renal disease.  SOCIAL HISTORY:  The patient lives with her mom, history of poor compliance. Denies alcohol, tobacco, or drug abuse.  PHYSICAL EXAMINATION:  VITAL SIGNS:  Temperature 98.4, pulse 126, blood pressure 148/91, respiratory rate 18 to 20.  Weight 55.7 kilograms.  GENERAL:  She is alert and oriented x 4.  Young African-American  female in obvious stress and discomfort.  HEENT:  Normocephalic, atraumatic.  Pupils equal, round and reactive to light and accommodation.  Extraocular movements intact.  She was anicteric. Oropharynx is  clear.  Mucous membranes are moist.  No exudate, no ulcers.  NECK:  No JVD, no bruits, no thyromegaly noted, nontender and supple.  NODES:  No cervical lymphadenopathy.  BACK:  No spinal or CVA tenderness.  There was no evidence of rash or edema.  CARDIOVASCULAR:  Regular rate and rhythm.  Clear S1, S2,  no murmurs, gallops or rubs.  PULMONARY:  Clear  to auscultation bilaterally with good air flow.  ABDOMEN:  Tense.  Tender to palpation diffusely.  She had hypoactive bowel sounds. She is positive for peritoneal dialysis catheter in her right upper quadrant. There is positive for frank pus at the exit site.  EXTREMITIES:  No edema, nontender, 2+ dorsalis pedis and posterior tibial bilaterally.  SKIN:  Intact without rash or ulcers.  MUSCULOSKELETAL EXAM:  She was moving all extremities.  NEUROLOGICAL EXAM:  Cranial nerves II through XII grossly intact.  Sensory is intact.  Strength was 5/5 throughout.  DTRs are 2+ throughout.  Toes were downgoing bilaterally.  LABORATORY DATA:  Pending.  ASSESSMENT AND PLAN:  [name] is a 25 year old African-American female with recurrent peritonitis who presents this evening because of obstruction of her peritoneal dialysis fluid flow.  I suspect she has clotted her catheter with fibrinous exudate due to her many infections.  The patient is currently clinically stable except for pain.  There is some confusion as to the outpatient intraperitoneal antibiotic that she was receiving.  The home training nurse reports that she was administering Ancef.  However, according to documentations on the patient, she was receiving [place].  Nonetheless, she has been on Cipro to which the Acinetobacter, diphtheroid, and Pseudomonas were all sensitive.  The patient will need her peritoneal dialysis catheter removed and an Ash catheter placed for hemodialysis at least temporarily.  1. Peritonitis.    a. Check CBCs, blood cultures and peritoneal fluid cultures.  Treat with       IV Tressie Ellis.  Continue Cipro and Diflucan.  Dr. Excell Seltzer is to remove the       peritoneal dialysis catheter tomorrow. 2. Chronic tunnel infection.  Will continue the ciprofloxacin. 3. End-stage renal disease.  The patient will need hemodialysis at least    temporarily.  Will, hopefully, have Ash catheter placed tomorrow.  Dr. Hassell Done     has spoken with [facility].  Check renal panel and follow closely. 4. Hypertension.  Continue Labetalol and Procardia.  5. Anemia.  Will check a CBC and continue EPO and iron. DD:  06/30/99 TD:  07/01/99 Job: [id number]

## 2010-07-31 NOTE — H&P (Signed)
[place]. [facility]  Patient:    [name], [name]                        MRN: [id number] Adm. Date:  [id number] Attending:  Dennis Bast Dictator:   Mickel Duhamel, M.D.                         History and Physical  CHIEF COMPLAINT:  Massive ascites on abdominal CT.  HISTORY OF PRESENT ILLNESS:  [name] is a 25 year old young woman with end-stage renal disease, on peritoneal dialysis until July 01, 1999, when peritoneal dialysis catheter was removed secondary to polymicrobial peritonitis which was refractory to treatment and poor peritoneal dialysis catheter function.  Since then, patient was started on hemodialysis and was noted to be febrile on July 07, 1999.  Blood cultures x 2 were obtained and antibiotic therapy was broadened to include vancomycin and tobramycin, as well as the Cipro and Diflucan that she was already on.  Since then, fevers have persisted, abdominal discomfort has worsened as well as abdominal distention.  After being seen at hemodialysis  today, it was recommended that she come to [facility] for an abdominal CT.  The CT revealed massive abdominal ascites with bowel loops pushed to the side and the possibility of pelvic abscess could not be ruled out.  The patient is admitted or paracentesis, both for diagnostic purpose and to relieve patients discomfort.  ALLERGIES:  None.  MEDICATIONS:  1. Nephro-Vite one p.o. q.d.  2. Iron sulfate three times daily with meals.  3. Os-Cal 1500 mg p.o. t.i.d. with meals.  4. Cipro 500 mg p.o. q.d.  5. Diflucan 150 mg p.o. every other day.  6. Labetalol 200 mg p.o. b.i.d.  7. Dulcolax 10 mg p.o. as needed for constipation.  8. Sorbitol 30 cc as needed for constipation.  9. Aspirin 325 mg a day. 10. Vancomycin initiated July 07, 1999. 11. Tobramycin initiated July 07, 1999.  HEMODIALYSIS ORDERS:  Patient is dialyzed at [facility] on Tuesday, Thursday and  Saturday with a blood flow rate of 250, three hours duration, 2-K bath, estimated dry weight of approximately 50 kg, Epogen 10,000 units injected.  PAST MEDICAL HISTORY:  1. 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     a. On hemodialysis Monday, Wednesday and Friday via right IJ Ash.     b. Peritoneal dialysis discontinued secondary to recurrent peritonitis.  2. Peritonitis.     a. Long-standing Pseudomonas cath tunnel infection, treated with Cipro nearly        continuously since March of this year.     b. Polymicrobial peritonitis with organisms including Pseudomonas aeruginosa,        Acinetobacter coagulase-negative Staph, diphtheroids and yeast, for which        the patient was treated with vancomycin, Tressie Ellis, Diflucan and Cipro from        June 15, 1999 to June 23, 1999.     c. Recurrent peritonitis, June 30, 1999.  Yeast revealed on culture. Patient        was treated with both Cipro and Diflucan.     d. Peritoneal dialysis catheter removed on July 01, 1999 secondary to        peritonitis and poor cath function.  3. 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  4. Hypertension.  5.  Depression.  6. Iron deficiency anemia; hemoglobin was 7.9 on June 30, 1999.  Note:  The     patient had had a two-unit blood transfusion during that hospitalization.  SOCIAL HISTORY:  Patient lives at home with her mother.  She has no history of substance abuse.  She and her mother have poor adherence with her complicated medical regimen.  PHYSICAL EXAMINATION:  VITAL SIGNS:  Temperature 104.3, blood pressure 145/91, pulse 135, respirations 20, weight 55.2 kg.  GENERAL:  This is a thin young woman with a flat affect who was fatigued but alert and cooperative.  HEENT:  Patient is normocephalic, atraumatic.  Extraocular muscles are intact.  Pupils are equal, round and reactive to light and her cranial nerves  are grossly intact.  NECK:  Patient has palpable submandibular nodes which are nontender.  She has no JVD.  CARDIOVASCULAR:  Tachycardic with a 2/6 systolic ejection murmur.  LUNGS:  Clear to auscultation.  No wheezes or rhonchi are appreciated.  ABDOMEN:  Distended with positive bowel sounds, nontender throughout and no guarding.  Site of peritoneal dialysis catheter is healing without evidence of infection.  EXTREMITIES:  No edema.  They are quite warm.  LABORATORY DATA:  CBC, CMET, phosphorus, vancomycin level and tobramycin level re all pending at the time of admission; also pending are blood cultures x 2, which are at [place]:  This is a 25 year old girl with fever, abdominal discomfort and massive ascites, likely secondary to persistent peritonitis or intra-abdominal infection.  Likely causes of her persistent infection, despite antibiotic therapy with Cipro, Diflucan, vancomycin and tobramycin, including resistant organisms,  particularly fungi, versus walled-off infection, versus inadequate concentration of appropriate antibiotics.  She will need drainage of ascites for both diagnosis f the etiology of her peritonitis as well as for her comfort.  Quite possibly, she will need placement of drainage catheters, surgical intervention and the consultation of an infectious disease specialist.  PLAN:  1. Probable persistent peritonitis:  Blood cultures x 2 were obtained at [facility]     on July 07, 1999.  I will contact them to find out the results.  For now, e     will continue aggressive antibiotic therapy with plans of an ultrasound-guided     drainage tomorrow with appropriate evaluation of ascitic fluid.  We will likely     consult infectious disease tomorrow and obtain a chest x-ray and a urinalysis     to rule out other common causes of fever to be more comfortable that her fever     is in fact coming from her tense abdominal ascites.  2.  End-stage renal disease:  Patient should be dialyzed on July 13, 1999.  I ill     contact [facility] for the exact orders, i.e., her Epogen      and InFeD dosing.  We will continue strict ins and outs, daily weights,     Nephro-Vite and calcium carbonate per her outpatient regimen.  3. Hypertension:  Give ______  blood pressure medications as tolerated.  4. History of anemia:  Will check a hemoglobin on admission and transfuse as     needed to keep her hemoglobin above 8, if patient and mother agree.  Note:     They have been reluctant in the past for blood transfusion, as they are hopeful     that the patient may one day have a kidney transplant and are fearful that     transfusion  will make the chance of transplant rejection greater.  We will     continue Epogen and InFeD to stimulate production of native red blood cells.  DD:  07/11/99 TD:  07/12/99 Job: [id number]

## 2010-07-31 NOTE — H&P (Signed)
[place]. [facility]  Patient:    [name], [name]                        MRN: [id number] Adm. Date:  [id number] Attending:  [name] Dictator:   Maia Plan, P.A. CC:         [facility]   History and Physical  REASON FOR ADMISSION:  Fever and gram-negative rod sepsis.  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  She has had a complicating history this year with multirefractory polymicrobial peritonitis in the spring 2001.  Ultimately losing her PD catheter and having multiple intra-abdominal abscesses requiring exploratory laparotomy and multiple drains during the hospitalization from April to June 2001.  She again had a recurrent intrapelvic abscess October 15, 1999 requiring drainage.  Blood cultures were all negative.  She was treated with antibiotics and discharged on Augmentin. This week the patient was in her usual state of health, until during her dialysis treatment on October 18, she spiked a temperature and experienced hard shaking chills.  Blood cultures were drawn and she was empirically treated with vancomycin and tobramycin.  Post dialysis she was seen in the emergency room where a chest x-ray showed a right lower lobe consolidation with questionable pneumonitis.  She was treated additionally with azithromycin.  She has had a cough with clear to yellow phlegm over the past week.  Today she arrives to dialysis as an outpatient with temperature at 99 when he gradually rose to 103 while on dialysis, again with hard shaking chills and heart rates as high as 160.  She received vancomycin 500 mg and Tobramycin 50 mg again.  Today all four blood culture bottles are reported to be growing gram negative rods from  October 18.  The chest x-ray in the emergency room continues to show a right lower lobe infiltrate versus atelectasis.  The patient denies nausea or vomiting, abdominal pain, shortness of breath or dysuria.  She will be admitted for sepsis, IV antibiotics and rule out intra-abdominal process.  ALLERGIES:  No known drug allergies.  CURRENT MEDICATIONS: 1. Labetalol 600 mg b.i.d. 2. Norvasc 5 mg q.d. 3. Nephro-Vite vitamin one daily. 4. Os-Cal 500 mg one with each meal. 5. Epogen 2500 units IV each dialysis.  PAST MEDICAL HISTORY: 1. 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 2. Hypertension. 3. Anemia of chronic disease. 4. Polymicrobial peritonitis April 2001 refractory to antibiotic therapy.    Various organisms included Staph coagulase negative and yeast.  PD    catheter removal was delayed due to mothers refusal.  She ultimately    lost her PD catheter April 2001, and required an exploratory laparotomy    secondary to multiple intra-abdominal and pelvic abscesses which    required repeated drainage.  Prolonged hospitalization April to June 2001. 5. History of noncompliance. 6. 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 7. 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 8. History of depression. 9. Recurrent intrapelvic abscess August 2, requiring drainage.  Cultures    all negative.  SOCIAL HISTORY:  The patient is a high Retail buyer.  Denies smoking and drinking.  Does not use illicit drugs.  FAMILY HISTORY:  Known history of renal disease otherwise noncontributory.  REVIEW OF SYSTEMS:  As in HPI.  PHYSICAL EXAMINATION:  VITAL SIGNS:  Temperature 102.6, blood pressure 120/60, heart rate in the 120s and regular, respirations 24.  GENERAL:  A thin black female lying in a fetal position on the emergency cart but denies pain.  SKIN:  Hot but all wounds are well healed.  NECK:  Supple, reddish.  The catheter has a  tiny amount of blood with hard palpation but no pus.  LUNGS: Rare rhonchi in the right base but no crackles otherwise clear.  HEART:  Tachycardic and regular no rubs.  ABDOMEN:  Positive bowel sounds, soft, nondistended.  Unremarkable to deep palpation.  The wounds are well healed.  EXTREMITIES:  No peripheral edema.  LABORATORY DATA:   Sodium 137, potassium 3.4, chloride 98, CO2 31, BUN 12, creatinine 3.6, glucose 81, calcium 8.5, phosphorus 2.4.  Hemoglobin 12.5, white count 3800 with 91% segs, platelets 50,000, total bilirubin 1.6.  LFTs within normal limits.  ASSESSMENT AND PLAN: 1. Gram-negative rods in blood questionable source.  Hemodialysis catheter    versus intra-abdominal or pelvic process versus pulmonary.  Will continue    vancomycin begin Ticlid.  Check abdominal and pelvic CT. 2. End-stage renal disease.  Continue dialysis every Tuesday, Thursday and    Saturday. 3. Hypertension and tachycardia.  Continue labetalol. 4. Recurrent intra-abdominal and pelvic abscesses secondary to severe    refractory peritonitis in April, May and August 2001.  Rule out new abscess    formation. 5. Leukopenia and thrombocytopenia question secondary to antibiotics    specifically Zithromax.  Will not restart and follow up in the morning. DD:  01/02/00 TD:  01/02/00 Job: [id number]

## 2010-07-31 NOTE — H&P (Signed)
[place]. [facility]  Patient:    HAFEN, _______                        MRN: [id number] Adm. Date:  06/15/99 Attending:  Jeneen Rinks L. Deterding, M.D. Dictator:   Jonah Blue, M.D. CC:         [facility]                         History and Physical  ADMITTING DIAGNOSES:  1. Peritonitis.  2. 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  3. Chronic peritoneal dialysis x 4 years.  4. History of multiple peritonitis infections with coag negative Staph, and     culture negative.  5. Refusing hemodialysis.  6. History of iron deficiency anemia.  7. Karyotype 47XXX.  8. History of hypertension, poorly controlled.  9. Learning disability. 10. Absent uterus and right ovary; small left ovary. 11. History of Pseudomonal tunnel infection diagnosed in 2001, on Cipro.  ADMITTING TEAM:  Renal, Dr. Jimmy Footman attending.  HISTORY OF PRESENT ILLNESS:  ______ is a 25 year old female with a history of a  solitary kidney and end-stage renal disease, secondary to focal segmental glomerulosclerosis who has been on peritoneal dialysis x 4 years.  She has a history of peritonitis secondary to poor compliance and presented to [facility] today with abdominal pain.  Apparently, the patient had a peritoneal bag  that was cloudy and was hypotensive with abdominal pain.  IV fluids and antibiotics were attempted, but the patient pulled IV.  She was sent to [facility] ED for evaluation and admission.  The patient admits to abdominal pain x 1 day.  She reports the pain is worse with movement and rated a 10 out of 10.  She denies any nausea or vomiting.  She did  have some loose stools today.  She denies any fevers, but has had some chills. She any syncope, but has had dizziness today.  She reports having a hole in her catheter that was changed at [facility].  She has been on  Cipro for a chronic tunnel infection, but has not taken the Cipro in over two weeks.  She has had multiple episodes of peritonitis in the past.  The patient reports no other changes in her level of function or urine output.  PAST MEDICAL HISTORY:  Please see admission diagnoses.  MEDICATIONS:  1. Nu-Iron 150 mg p.o. t.i.d. between meals.  2. Os-Cal 500 mg p.o. t.i.d. with meals.  3. Altace 5 mg q.d.  4. Nephro-Vite p.o. q.d.  5. Rocaltrol 0.5 mg q.d.  6. Procardia XL 60 mg p.o. b.i.d.  7. Enteric-coated aspirin q.d.  8. EPO 10,000 units Monday/Wednesday/Friday.  9. Labetalol 200 mg p.o. b.i.d. 10. Cipro 250 mg p.o. q.d.  ALLERGIES:  No known drug allergies.  SOCIAL HISTORY:  The patient lives with mom.  Has a history of poor compliance.  Denies any alcohol or drugs.  FAMILY HISTORY:  Positive for hypertension in mom.  Denies kidney disease.  REVIEW OF SYSTEMS:  No fever.  Positive chills.  No nightsweats.  Positive for Foley catheter; no discharge from wound.  Positive abdominal pain; no nausea, vomiting.  Positive stools.  Positive dizziness.  No syncope.  PHYSICAL EXAMINATION:  GENERAL:  In severe distress, complaining of abdominal pain; she is alert, but ot answering questions due to pain.  VITAL SIGNS:  Temperature 96.3, blood pressure 65/39 (repeat 85/47), heart rate  113, respiratory rate 18.  HEENT:  Pupils equal, round and reactive to light; extraocular movements intact. Nose without discharge.  Throat pink.  Fundi reveal mild AV nicking.  NECK:  Without adenopathy; no JVD, no bruits.  CHEST:  Clear to auscultation bilaterally.  CARDIOVASCULAR:  Regular rate and rhythm without murmur; an S4 is heard.  ABDOMEN:  Mild distention.  There is diffuse tenderness with positive rebound and guarding.  Bowel sounds are decreased.  There is a catheter in place, with a small amount of discharge.  There is a dirty eschar around the tubing, with  cuff showing.  EXTREMITIES:  No clubbing, cyanosis or edema.  Pulses are 2+ in the dorsalis pedis and posterior tib.  No lesions noted.  No femoral bruits.  LABORATORY STUDIES:  Pending.  ASSESSMENT/PLAN:  This is a 25 year old with end-stage renal disease, on chronic peritoneal dialysis with peritonitis and sepsis. 1. Peritonitis/sepsis. The patient is admitted to the floor as blood pressures did improve with a 250 c bolus.  Will obtain peritoneal cultures and blood cultures, a CBC and a renal profile.  Likely, will start vancomycin and Ceftaz, but patient may need removal of her peritoneal catheter.  Will drain the peritoneum; currently, it is in for cell count and cultures and afterwards, start rapid exchange x 3.  Then, will start APD at two liters, 1.5% q.4h. 2. Anemia. Will continue Epogen. 3. Hypertension. Resolved with IV fluids; will hold BP meds for now. 4. End-stage renal disease. Will continue peritoneal dialysis, as above. 5. Culture wound. 6. Clear fluids only until abdomen is improved. 7. Morphine p.r.n. for pain. DD:  06/15/99 TD:  06/15/99 Job: [id number]

## 2010-07-31 NOTE — H&P (Signed)
[place]. [facility]  Patient:    [name], [name]                        MRN: [id number] Adm. Date:  [id number] Disc. Date: [id number] Attending:  Lance Sell Dictator:   Foye Clock, P.A. CC:         [facility]  Radiology Dept, Dr. Domenick Bookbinder   History and Physical  HISTORY OF PRESENT ILLNESS:  This is a 25 year old black female with a history of end-stage renal disease, secondary to FSG, with a solitary kidney, and also known recent septic-type picture peritonitis with the PD catheter removed recently, and the patient having trouble with recurrent abdominal-pelvic fluid collection.  Currently dialyzing using a right IJ permanent catheter, and now being admitted after some complications when a CT scan of her abdomen was performed to evaluate outpatient temperature and also was found to have a fluid collection in the abdominal and pelvis region, with 75 cc of this removed per the radiology P.A.   The patient developed some respiratory distress and decreased O2 saturations, after she received in radiology 2 mg of IV Versed, 100 mg of IV fentanyl, and 25 mg IV Phenergan.  Again noted, she developed some respiratory distress.  Her O2 saturations were in the 80s.  A chest x-ray was planned, and showed pulmonary edema, questionable whether this was an infiltrative process on the right as noted by Dr. Windy Kalata. She came to the hemodialysis area for emergent volume removal.  Three L were removed, and she developed some decreased breath sounds, worsening respiratory status, and was placed on BiPAP.  She was coughing up thick mucus while on hemodialysis.  The plans were to intubate her if needed.  Blood cultures were to be drawn and sputum was to be sent for C&S.  She was to be started on empiric Zosyn.  CURRENT MEDICATIONS AT TIME OF ADMISSION: 1. Nephro-Vite one q.d. 2. Pepcid 20 mg q.d. 3. Labetalol 40 mg b.i.d. 4.  Darvocet-N 100 q.6h. p.r.n. pain. 5. At the dialysis center she had listed also Epogen 2000 units q. dialysis.  PAST MEDICAL HISTORY: 1. Peritonitis with __________ sensitive to [place] and ciprofloxacin,    admitted on June 30, 1999, discharged on July 04, 1999.  Of note,    the patient has had several incidences of peritonitis during the    peritoneal dialysis with organism of _________, coagulase-negative    Staphylococcus, RS, rare yeast, and despite several recommendations    over several months to remove the PD catheter, secondary to nonresolving    peritonitis, again the patients mother had refused to give permission    for the PD catheter to be removed because of her significant peritonitis    on that admission on June 30, 1999, to July 04, 1999.  Dr. Marland Kitchen T.    [place] removed her PD catheter on July 01, 1999.  Noted at that time    the patient was somewhat significantly malnourished with an albumin of    less than 1.5. 2. Other medical problems include iron deficiency anemia. 3. History of noncompliance. 4. History as above of end-stage renal disease, secondary to a solitary    kidney with focal segmental abdominal sclerosis. 5. Karyotype 47XXX with secondary learning disability. 6. Absent uterus and right ovary, with small left ovary. 7. History of hypertension. 8. Noncompliance with medications. 9. History of depression.  FAMILY HISTORY:  No  history of renal disease.  Otherwise noncontributory.  ALLERGIES:  No known drug allergies.  SOCIAL HISTORY:  The patient lives with her mother.  Denies alcohol, tobacco, or drug abuse in the past.  History of poor compliance.  PHYSICAL EXAMINATION AT TIME OF NEED FOR EMERGENT HEMODIALYSIS:  GENERAL:  A young thin black female, lethargic at times, and then showing signs of distress because of shortness of breath.  VITAL SIGNS:  Pulse 106 and regular, respirations 32, blood pressure 191/105, temperature not taken  initially, on later check 98.2 degrees.  HEENT:  Head normocephalic.  NECK:  No elevated neck veins.  CHEST/LUNGS:  Bilateral crackles noted.  She had a right IJ permanent catheter noted.  Nontender, no discharge noted.  CARDIAC:  She had tachycardia and a regular rhythm.  No rub heard.  ABDOMEN:  Bowel sounds positive.  Normal abdomen.  Minimal distention.  Tender over the JP drainage catheter noted.  Scant amount of serosanguineous fluid.  RECTAL:  Not done.  EXTREMITIES:  No pedal edema.  RENAL PROFILE:  CBC pending on admission.  Chest x-ray showed pulmonary edema.  IMPRESSION: 1. Pulmonary edema with hypoxia, with a repeat chest x-ray showing    possible aspiration pneumonia.  PLAN:  Blood cultures have been taken x 2.  Sputum cultures for Grams stain. Started on empiric Zosyn.  The patient was moved to the intensive care unit after she had worsening respiratory status while on hemodialysis, and was placed on BiPAP.  2. Questionable infected abdominal area, febrile as an outpatient.  PLAN:  Will follow up the cultures of this.  Again, the patient is placed on Zosyn.  3. End-stage renal disease.  PLAN:  Continue on hemodialysis using her permanent catheter.  This may be need to be removed if a septic picture appears.  In the long-term will need a graft when sepsis issue is resolved and the abdominal process is under control.  4. Hypertension.  PLAN:  Continue on labetalol and medications at this time, and volume removal on hemodialysis.  ADDENDUM:  Labs back show that the white count is 14,000, hemoglobin 9.3.  The potassium 5.0, BUN __, creatinine 6.4, glucose 108, CO2 of 24, chloride 100. Calcium 9.4, albumin 2.3, phosphorus 4.6. DD:  10/16/99 TD:  10/16/99 Job: [id number]

## 2010-07-31 NOTE — H&P (Signed)
[place]  Patient:    MADELINNE OBRYANT                         MRN: [id number] Adm. Date:  [id number] Attending:  McDiarmid, Blane Ohara. Dictator:   Pecolia Ades, M.D. CC:         Lauro Regulus, M.D.                         History and Physical  DATE OF BIRTH:  [birth year]  PRIMARY PHYSICIAN:  Lauro Regulus, M.D., [place].  SERVICE:  Manufacturing engineer.  ATTENDING PHYSICIAN:  Merlinda Frederick McDiarmid, M.D.  INTERN:  Pecolia Ades, M.D.  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  She as without complaints today when she saw her pediatric nephrologist, Dr. Marion Downer, at [facility].  Routine labs were drawn at that time revealing a hemoglobin of .4 and a calcium of 5.9 (official records are still pending).  The patient did not  want to be admitted to [facility] secondary to lack of transportation and thus requested being admitted to [facility] for transfusion.  REVIEW OF SYSTEMS:  Includes approximately a 5-pound weight gain over the past month.  Mom reports that she had been "eating well."  Denies any chest pain, positive shortness of breath which started approximately one week ago with the RI. Positive dyspnea on exertion upon walking up steps.  GI: Positive abdominal pain approximately one week ago. (Felt like a ball was rolling around in my stomach.) Skin: No rash.  Neurologic: No weakness.  Psychiatric: No changes, no visual changes.  Had a cold approximately one week ago.  Denies any dysuria, urgency, r  frequency.  Reports that when she is drinking, she makes approximately 32 ounces of urine a day; however, when she is going to school, she rarely makes this much per mom.  PAST MEDICAL HISTORY: 1.  Born with a solitary kidney. 2. 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 3. History of anuresis. 4. 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  MEDICATIONS:  1. Aspirin 325 mg p.o. q.d.  2. Nephro-Vite p.o. q.d.  3. Os-Cal 500 mg p.o. 15 times per day.  4. Labetalol 200 mg p.o. t.i.d.  5. Nifedipine 60 mg p.o. b.i.d.  6. Nifedipine 10 mg capsules p.o. p.r.n. diastolic blood pressure greater than or     equal to 85.  7. Vasotec 10 mg p.o. b.i.d. l  8. Minoxidil 2.5 mg p.o. q.d.  9. Nu-Iron 150 mg p.o. q.d. 10. EPO, unknown dose.  ALLERGIES:  No known drug allergies.  PROCEDURES:  Hernial repair as an infant.  Institution of peritoneal dialysis catheter two to three years ago.  SOCIAL HISTORY:  The patient is an eighth grade student in middle school.  She denies any history of alcohol, drug, or tobacco use.  Other physicians include Nagraj at [facility].  FAMILY HISTORY:  She lives with her mother.  She has six siblings, one brother nd five sisters, three of which live at home.  PHYSICAL EXAMINATION:  GENERAL:  The patient is a well-appearing, teenage girl who is in no apparent distress.  She is pleasant and cooperative throughout  the exam.  Ambulatory without any problems.  The patient is alert and oriented x 4.  HEENT:  Normocephalic, atraumatic.  PERRL.  EOMI.  Oropharynx is clear.  NECK: Supple with positive anterior cervical lymphadenopathy.  No thyromegaly. No JVD.  LUNGS:  Clear to auscultation bilaterally with good respiratory effort.   HEART:  Regular rate and rhythm with a 2 to 3/6 systolic ejection murmur.  EXTREMITIES:  No edema.  SKIN:  She does have hyperpigmentation over extensive surfaces of knees and elbows.  ABDOMEN:  Soft and nontender with bowel sounds.  She has a midline surgical scar approximately 4 cm in length as well as a peritoneal dialysis catheter protruding from her abdomen on  the right side of her abdomen.  NEUROLOGIC:  Cranial nerves II-XII grossly intact.  Motor is 5/5 bilateral upper and lower extremities.  Gait is normal.  Sensation is intact to light touch. DTRs are 2+ and symmetric throughout.  LABORATORY DATA:  CBC, CMET, magnesium phosphate are pending.  ASSESSMENT/PLAN:  A 25 year old female with chronic renal failure on peritoneal  dialysis.  She was found to have a hemoglobin of 6.4 and a calcium of 5.9.  1. Anemia.  Plan to admit to 5500.  Repeat labs including CBC, CMP, magnesium    phosphate.  If values are accurate, will transfuse to appropriate level and    continue Epogen.   Will consult renal to help adjust dialysis and adjust    hypocalcemia.  All stools will be guaiaced looking for possible gastrointestinal    losses. 2. Hypocalcemia.  This will also be corrected with the help of renal during    dialysis as well as p.o.  The patient is on a large dose of calcium, and will    encourage her to take this and make any adjustments per renals recommendations. DD:  12/18/98 TD:  12/18/98 Job: [id number]

## 2010-07-31 NOTE — Op Note (Signed)
[place]. [facility]  Patient:    [name], [name]                        MRN: [id number] Proc. Date: 07/14/99 Adm. Date:  [id number] Attending:  Lance Sell CC:         Joyice Faster. Deterding, M.D.                           Operative Report  PREOPERATIVE DIAGNOSIS:  Acute and chronic peritonitis with multiple intra-abdominal abscesses.  POSTOPERATIVE DIAGNOSIS:  Acute and chronic peritonitis with multiple intra-abdominal abscesses.  OPERATION PERFORMED:  Exploratory laparotomy, debridement and drainage of multiple intra-abdominal and pelvic abscesses.  SURGEON:  Edsel Petrin. Dalbert Batman, M.D.  ANESTHESIA:  OPERATIVE INDICATIONS:  This is a 25 year old black female with end-stage renal  disease on chronic peritoneal dialysis since 1995.  Her peritoneal dialysis catheter was removed on April 18 secondary to polymicrobial peritonitis, refractory to IV antibiotics.  A subclavian hemodialysis catheter was placed at that time.  She did improve and went home, but had to be readmitted on April 29 with fever o 103, and abdominal distention and pain.  Abdominal CT and ultrasound showed a huge amount of loculated ascites.  A drainage catheter was placed two days ago, but id not drain much fluid.  It was felt that this was due to the multiple loculations of fluid.  Because of continued elevation of her white count and high spiking fevers, I was asked to see her.  I did not feel that she was a good candidate for percutaneous drainage.  Both I and the infectious disease staff felt that primary antibiotic therapy was unlikely to resolve this considering the failure to date. Exploratory laparotomy with debridement and drainage of her peritonitis was offered to the patient and her mother, and they both agreed with this plan.  She is brought to the operating room somewhat urgently for exploratory laparotomy.  OPERATIVE FINDINGS:  The patient had 3 L or 4 L  volume of gelatinous multiloculated fluid collections.  The fluid was somewhat bloody and had a faint odor to it, but was either gelatinous or watery.  There was no obvious purulent fluid or well-developed bacterial abscess.  There did not appear to be any primary gastrointestinal tract process.  The small bowel and large bowel, stomach, spleen, and liver appeared to be somewhat secondarily inflamed.  There was no evidence f any GI tract disease.  DESCRIPTION OF PROCEDURE:  Following the induction of general endotracheal anesthesia, a nasogastric tube and a Foley catheter was placed.  The abdomen was prepped and draped in a sterile fashion.  A midline laparotomy was performed. e very carefully entered the abdomen and entered the space where the loculated ascites was, and we were able to do this fairly easily without any trauma to the bowel.  We spent the majority of the case bluntly dissecting, removing, and debriding the multiple gelatinous loculated abscesses.  Cultures for aerobic and anaerobic bacteria, fungus and AFB were obtained.  A huge amount of ascites was  present in the pelvis, in the free peritoneal cavity, in the right and left pericolic gutters.  The subphrenic spaces appeared to be spared.  We explored these areas to be sure, but there did not appear to be any abscess or loculated ascites there.  There were some _________ ascites in the subhepatic space and this was debrided.  We had a little bit of bleeding from the under surface of the right lateral lobe of the liver, but that resolved spontaneously.  After debriding as much as we could with blunt dissection of the hand and with ing forceps, we checked every where for bleeding.  We irrigated the abdomen on several occasions, probably using 6000 cc or 7000 cc of saline.  Once we had completed the debridement, we inspected all areas of dissection. We were very satisfied that there was no bleeding.  We placed  Seprafilm on top of he small bowel to reduce adhesions to the anterior abdominal wall.  The midline fascia was closed with a running suture of #1 Novofil.  The skin was closed loosely with a few skin staples, and we placed Telfa wicks in between the skin edges to promote drainage.  Clean bandages were placed and the patient taken to the recovery room in stable  condition.  Estimated blood loss was about 200 cc.  Complications none. Sponge, needle, and instrument counts were correct. DD:  07/14/99 TD:  07/16/99 Job: [id number]

## 2010-07-31 NOTE — Op Note (Signed)
[place]. [facility]  Patient:    [name], [name]                        MRN: [id number] Proc. Date: 02/29/00 Adm. Date:  [id number] Disc. Date: [id number] Attending:  Dorothea Glassman                           Operative Report  PREOPERATIVE DIAGNOSIS:  Chronic renal failure.  POSTOPERATIVE DIAGNOSIS:  Chronic renal failure.  OPERATION:  Placement of new left upper arm arteriovenous graft.  SURGEON:  Judeth Cornfield. Scot Dock, M.D.  ASSISTANT:  Ricki Miller, P.A.  ANESTHESIA:  General  DESCRIPTION OF PROCEDURE:  The patient was taken to the operating room and received a general anesthetic.  The left upper extremity was prepped and draped in the usual sterile fashion.  A transverse incision was made at the antecubital level.  The patient had a large basilic vein but no cephalic vein was usable for a primary fistula.  The brachial artery was dissected free beneath the fascia and initially it was felt it was a pretty good size, although it did spasm down some.  We irrigated it copiously with Papaverine. A second longitudinal incision was made beneath the axilla and the high axillary vein here was dissected free.  This was a good sized vein. A 4 to 7 mm graft was then tunneled in the upper arm.  The patient was then heparinized.  The brachial artery was clamped proximally and distally and a longitudinal arteriotomy was made.   A segment of the 4 mm end of the graft was excised.  The graft was slightly spatulated and sewn end to side to the artery using continuous 6-0 Prolene suture.  The graft was then pulled to the appropriate length for anastomosis to the high brachial vein.  This was ligated distally and spatulated proximally.  The graft was spatulated and sewn end to end to the vein using continuous 6-0 Prolene suture.  At the completion there was a good thrill in the graft and good radial signals at the wrist. Hemostasis was obtained in the  wounds.  The wounds were closed with deep layer of 2-0 Vicryl and the skin closed with 4-0 Vicryl.  Sterile dressing was applied.  The patient tolerated the procedure well and was transferred to the recovery room in satisfactory condition.  All needle and sponge counts were correct. DD:  02/29/00 TD:  03/01/00 Job: [id number]

## 2010-07-31 NOTE — Op Note (Signed)
NAME:  [name], [name]                           ACCOUNT NO.:  [id number]   MEDICAL RECORD NO.:  [id number]                   PATIENT TYPE:  OIB   LOCATION:  2899                                 FACILITY:  [facility]   PHYSICIAN:  Mechele Collin, M.D.                  DATE OF BIRTH:  [birth year]   DATE OF PROCEDURE:  11/02/2001  DATE OF DISCHARGE:  11/02/2001                                 OPERATIVE REPORT   PREOPERATIVE DIAGNOSIS:  End-stage renal disease.   POSTOPERATIVE DIAGNOSIS:  End-stage renal disease.   PROCEDURE:  Thrombectomy and advanced revision of her right upper arm  arteriovenous graft.   SURGEON:  Mechele Collin, M.D.   ASSISTANT:  Richrd Sox, P.A.   ANESTHESIA:  Local MAC.   ESTIMATED BLOOD LOSS:  20 cc.   DRAINS:  None.   SPECIMENS:  None.   COMPLICATIONS:  None.   DESCRIPTION OF PROCEDURE:  The patient was brought in the operating room and  placed on the operating table in the supine position with the right arm  extended on an arm board.  Following adequate IV sedation, 1% lidocaine was  used to anesthetize an area just below the axilla on the medial aspect of  the arm at the level of an existing scar.  An oval incision was made  encompassing an existing keloid that was excised.  The midline wound was  then deepened using the Bovie to control bleeding.  The venous limb of the  graft was identified at its proximal venous anastomosis to the axillary  vein.  The graft was mobilized and encircled with a vessel loop.  The vein  was then exposed more proximally in the axilla to the level of a good  caliber and quality vein.  Next the patient was systemically heparinized.  Following adequate three-minute circulation time, a transverse graftotomy  was performed just above the venous anastomosis.  The arterial inflow of the  graft was then thrombectomized with return of excellent inflow.  Heparin-  saline solution was then instilled into the arterial limb of the  graft and  the graft occluded with a vascular clamp.  Next the graft was transected at  this level and the distal portion of the graft and the first portion of  axillary vein up to the level of healthy-appearing vein was resected.  The  axillary vein was then spatulated.  A 6 mm PTFE graft was then brought on  the field, spatulated, and sewn in position in end-to-end fashion to the  axillary vein with a running 6-0 Prolene suture.  Following completion of  this anastomosis, the graft was then trimmed to the appropriate length and  sewn in position to the existing limb of the Gore-Tex graft with the running  6-0 Prolene suture.  Prior to completion of this, the graft was flushed and  backbled.  The anastomosis  was then completed and clamps were released,  restoring flow.  There was excellent thrill in the graft.  No bleeding from  the anastomotic suture line.  There was excellent biphasic Doppler flow in  the ulnar artery.  Protamine 20 mg was administered, hemostasis achieved,  and the wounds were closed utilizing 3-0 and 4-0 Vicryl suture.  Sterile dry  dressings were applied.  The patient was then awakened from anesthesia and  transferred to the recovery room in stable condition.  The patient tolerated  the procedure well.  No complications.  All needle and sponge counts were  correct.                                               Mechele Collin, M.D.    Joni Reining  D:  11/02/2001  T:  11/05/2001  Job:  [id number]

## 2010-07-31 NOTE — Discharge Summary (Signed)
[place]. [facility]  Patient:    ESSENCE, HEACOCK Visit Number: [id number] MRN: [id number]          Service Type: DSU Location: [facility] 2899 03 Attending Physician:  Mechele Collin Dictated by:   Maia Plan, P.A. Admit Date:  01/14/2001 Discharge Date: 01/14/2001   CC:         [facility]   Discharge Summary  ADMITTING DIAGNOSES: 1. Hyperkalemia. 2. End-stage renal disease on chronic hemodialysis. 3. Hypertension. 4. Clotted left upper arm arteriovenous Gore-Tex graft.  DISCHARGE DIAGNOSES: 1. Hyperkalemia corrected with emergency hemodialysis via right femoral    catheter. 2. Status post thrombectomy and revision of left upper arm arteriovenous    Gore-Tex graft, Dr. Allean Found. 3. Hypertension. 4. End-stage renal disease on chronic hemodialysis.  BRIEF HISTORY:  Ms. Lambright is a 25 year old black female with end-stage renal disease on chronic hemodialysis every Tuesday, Thursday, and Saturday at the [facility] who presents to outpatient dialysis with a clotted left upper arm AV Gore-Tex graft.  Her potassium was found to be 6.8.  She was brought to the emergency room and rushed to the hemodialysis unit where she had a right femoral catheter placement for temporary access.  HOSPITAL COURSE:  The patient underwent two hours of hemodialysis on a zero potassium bath.  Approximately 1900 cc of ultrafiltrate were removed.  She had no drop in her blood pressure.  An i-STAT done after dialysis showed potassium of 4.7.  It was then safe to proceed with surgery.  She underwent thrombectomy and revision of the distal venous anastomosis of her left upper arm AV Gore-Tex graft.  She tolerated that procedure well.  There were no complications and she was discharged the same day.  DISCHARGE INSTRUCTIONS:  She is to return for outpatient hemodialysis Tuesday, Thursday, and Saturday at [facility].  She was cautioned on dietary  potassium indiscretion.  DISCHARGE MEDICATIONS:  She was to resume her usual home meds. Dictated by:   Maia Plan, P.A. Attending Physician:  Mechele Collin. DD:  03/17/01 TD:  03/18/01 Job: [id number]

## 2010-07-31 NOTE — Discharge Summary (Signed)
[place]. [facility]  Patient:    [name], [name]                        MRN: [id number] Adm. Date:  [id number] Disc. Date: 08/14/99 Attending:  Lance Sell CC:         Edsel Petrin. Dalbert Batman, M.D.  Arelia Longest. Michelle Nasuti., M.D.  [place]   Discharge Summary  ADMISSION DIAGNOSES: 1. Persistent peritonitis. 2. Massive ascites. 3. End-stage renal disease on hemodialysis. 4. Hypertension. 5. Anemia of chronic disease.  DISCHARGE DIAGNOSES: 1. Status post exploratory laparotomy secondary to multiple intra-abdominal    abscesses due to severe peritonitis. 2. Enterococcus growing out of abscess drained from the pelvis Aug 05, 1999,    treated with vancomycin. 3. End-stage renal disease on hemodialysis. 4. Hypertension. 5. Iron-deficiency anemia, status post 11 units of packed red blood cells    transfusion. 6. Status post multiple intra-abdominal and intra-pelvic Jackson-Pratt drain    placements for drainage of intra-abdominal fluid collection.  BRIEF HISTORY:  A 25 year old black female with end-stage renal disease, having been on peritoneal dialysis up until July 01, 1999, when the PD catheter was removed secondary to severe polymicrobial peritonitis which was refractory to treatment.  Organisms that had grown out were Pseudomonas aeruginosa, Acinetobacter, calcoaceticus yeast, and corynebacterium diphtheroids.  Due to infection being refractory to treatment and PD catheter functioning poorly it was removed, and the patient was started on hemodialysis in mid April.  She was noted to be febrile on July 07, 1999, with blood cultures drawn as an outpatient and antibiotic therapy, including vancomycin and tobramycin, as well as Cipro and Diflucan.  Fevers have persisted.  Her abdominal discomfort has worsened.  Her abdominal girth has grown.  She came to [facility] for a CT scan on the day of admission which revealed  massive ascites with the possibility of pelvic abscesses.  She was admitted for both diagnostic and therapeutic paracentesis.  LABORATORY DATA ON ADMISSION:  White count 12,900, hemoglobin 7.7, hematocrit 23.6, platelets 454,000.  Sodium 133, potassium 3.8, chloride 99, CO2 28, glucose 101, BUN 29, creatinine 6.6, calcium 7.6, albumin less than 1.5. Total protein 5.8.  SGOT 14, SGPT 37, total bilirubin 0.6, phosphorus 3.6. Vancomycin level 21.5, tobramycin level 3.7.  Chest x-ray bibasilar atelectasis, right greater than left.  HOSPITAL COURSE: #1 - INTRA-ABDOMINAL ABSCESSES AND ASCITES SECONDARY TO SEVERE PERITONITIS: As mentioned earlier the patient had polymicrobial peritonitis which was rather severe and refractory to Cipro and Diflucan.  CAT scan on admission showed very large amount of ascites with possible loculated abscesses in the cul-de-sac.  She underwent a CT-guided paracentesis after her coagulopathy was corrected with vitamin K, DDAVP, Premarin, and FFP, due to a prolonged bleeding time of 12.5.  She tolerated the paracentesis well.  Her fluid was sent off for cell count which returned at 1620 white blood cells seen of which 92% were monocytes.  The fluid appeared bloody and hazy.  Bacterial and fungal cultures were sent, and these all remained negative.  She was continued on Cipro and Diflucan, and vancomycin was added.  On July 12, 1999, she underwent CT-guided paracentesis as mentioned, and also an abscess was drained with placement of an 8 French drainage catheter in the peritoneal cavity from a left abdominal approach.  Cultures of this drainage were negative.  The patient was spiking temperatures as high as 103.5.  Her white blood cell count would range  between upper limits of normal, peaking to 17,400.  Ultrasound of the abdomen following drain placement showed the peritoneal cavity filled with innumerable small fluid loculations, rather than a dominant collection.   With these findings, the 8 French drainage catheter was removed.  Dr. Dalbert Batman proceeded with exploratory laparotomy on Jul 14, 1999. Postoperative diagnosis was that of acute and chronic peritonitis with multiple intra-abdominal abscesses.  He debrided and drained these intra-abdominal and pelvic abscesses.  Postoperatively, she was placed on TNA due to her severe malnutritional state.  In the next several days postoperatively she continued to spike high temperatures as high as 103.5.  Her abdominal wounds looked clean and were healing well.  Infectious disease consult was obtained who recommended stopping all antibiotics and observing since no culture this hospitalization had returned positive yet.  All antibiotics were stopped on Jul 20, 1999. Repeat CT scan on Jul 22, 1999, showed air fluid level in the peritoneal cavity due to peritoneal fluid and air related to recent laparotomy.  The peritoneum was thickened due to chronic peritonitis, but no focal abscess was seen.  The moderate amount of peritoneal fluid present in the pelvis had improved from prior study with interval laparotomy done.  The following day she had an uncomplicated CT-guided drain placed in the large peritoneal fluid collection. A 10 [place] all purpose drain catheter was used.  A second fluid collection was seen to the left of the large collection which is noncommunicating with the large collection by septation.  A couple days later she had a second drain placed low in the pelvis. The anterior abdominal drain was labeled as drain #1, and the pelvic drain was labeled as drain #2.  With each drainage of intra-abdominal and intra-pelvic fluid collection, both bacterial and fungal cultures were sent.  These all remained negative.  One of the catheters stopped functioning and she had a new drain placed on Jul 29, 1999.  Followup CAT scan approximately Aug 02, 1999, showed interval decrease in the size of  the left pelvic  abscess cavity.  Approximately 3.5 to 4 cm in size fluid collection remained adjacent to the catheter as well as a small amount of air. She had her final drain placed on Aug 07, 1999, which replaced a nonfunctioning drain.  Ultimately, an intra-pelvic fluid collection culture grew out Enterococcus, collected on Aug 05, 1999.  It was sensitive to vancomycin, and vancomycin was started on Aug 07, 1999.  Thereafter, her temperature curve decreased somewhat, but she never really became afebrile. Maximum temperatures in the last 72 hours prior to discharge were 100.2, ranging between that and normal.  Her final CT scan on Aug 11, 1999, showed slight interval decrease in size of fluid collection superior to the bladder, and small locules of gas identified in this pocket.  There was increase in presacral edema fluid.  Toward the end of her hospitalization the patient was stable enough to be given some home passes.  She was very depressed at her clinical situation, and her mother strongly urged [place] to discharge her where she assured [place] she could take care of her drains at home.  The mother was instructed on flushing both abdominal drains with 10 cc of sterile normal saline twice a day.  She was given supplies.  She was instructed on how to keep strict records of flush volumes and drainage volumes.  She was to take temperatures every six hours, and notify us should her temperature spike to 101 or greater.  Vancomycin  is her only antibiotic at the time of discharge, having been started Aug 07, 1999.  She was to receive 750 mg IV after each dialysis as long as her drains were to remain in. She will have followup with Dr. Dalbert Batman one week after discharge.  She will bring her temperature records and drain records to the dialysis center where we will be following them closely.  She will have serial abdominal CTs done to evaluate presence or absence of fluid collections, and Dr. Dalbert Batman along with the  radiologist will decipher management of the drains.  #2 - END-STAGE RENAL DISEASE:  The patient dialyzed her usual Tuesday, Thursday, and Saturday schedule.  Due to her prolonged illness she has lost significant amount of weight.  On admission she was 65 kg, and at time of discharge her dry weight is estimated at 42 kg.  Her dialysis catheter required two treatments with tPA for patency.  At time of discharge her catheters were functioning well.  #3 - HYPERTENSION:  Labetalol was used predominately, varying between 200 mg b.i.d. and 400 mg b.i.d. dosing.  Occasionally, depending on her oral intake status, clonidine patches were used.  Ultimately, she was discharged home on 400 mg of labetalol b.i.d. with blood pressures running approximately 140/80.  #4 - IRON-DEFICIENCY ANEMIA:  The patient had iron studies done, initially showing transferrin saturation level of 34%.  It was then rechecked a couple weeks later and it returned low at 15%.  InFeD was started, and she received several doses, but ferratin returned at 1831, and Infed was stopped.  She was ultimately transfused 11 units of packed red blood cells.  Stool guaiacs were ordered repeatedly, however, no results are in the chart.  At time of discharge her hemoglobin was stable at approximately 9.9.  DISCHARGE MEDICATIONS: 1. Vancomycin 750 mg IV each dialysis as long as drains are in. 2. Labetalol 400 mg b.i.d. 3. Pepcid 20 mg daily. 4. Darvocet-N 100 one or two pills q.6h. p.r.n. pain. 5. Nephro-Vite vitamin one daily. 6. Tylenol two tablets q.4h. p.r.n. fever. 7. EPO 20,000 units IV each dialysis.  The patients mother was instructed to record temperatures every six hours and keep a record of this, to flush each drain b.i.d. using 10 cc of sterile normal saline, and not to aspirate, to measure drain outputs every 24 hours. When outputs were to match inputs, she was to stop flushing.  FOLLOWUP:  They will follow up with Dr.  Dalbert Batman on August 21, 1999, and she will be seen every Tuesday, Thursday, and Saturday at the [facility].  Dry weight 42 kg. DD:  11/05/99 TD:  11/06/99 Job: [id number]

## 2010-07-31 NOTE — Op Note (Signed)
[place]. [facility]  Patient:    [name], [name]                        MRN: [id number] Proc. Date: 07/01/99 Adm. Date:  [id number] Attending:  Lance Sell                           Operative Report  PREOPERATIVE DIAGNOSIS:  End-stage renal disease.  POSTOPERATIVE DIAGNOSIS:  End-stage renal disease.  PROCEDURE:  Placement of a right internal jugular Ash catheter.  SURGEON:  Rosetta Posner, M.D.  ASSISTANT:  Nurse.  ANESTHESIA:  General endotracheal.  COMPLICATIONS:  None.  DISPOSITION:  To recovery room stable.  DESCRIPTION OF PROCEDURE:  The patient was taken to the operating room and positioned with the area of the right and left neck and chest prepped and draped in a sterile fashion.  The patient was placed in Trendelenburg position.  Using local anesthesia and a finder needle, the right internal jugular vein was identified.  Next, using a Seldinger technique, a guidewire was passed down to the level of the right atrium, and this was confirmed with fluoroscopy.  An incision was made at the level of the clavicle, and a tunnel was created from this incision to the guidewire entry site.  A 24 cm Ash catheter was brought through the tunnel.  A dilator and peel-away sheath were passed over the guidewire, and the dilator and guidewire were removed.  The catheter was passed down the peel-away sheath, which was then removed as well. Both lumens flushed and aspirated easily, were allowed to fill with 1000 unit/cc heparin.  The catheter was secured to the skin with 3-0 nylon stitch. The entry site was closed with 4-0 subcuticular Vicryl stitch.  A sterile dressing was applied, and the patient was taken to the recovery room in stable condition. DD:  08/17/99 TD:  08/20/99 Job: [id number]

## 2010-08-03 ENCOUNTER — Encounter: Payer: Self-pay | Admitting: Family Medicine

## 2010-08-04 ENCOUNTER — Other Ambulatory Visit: Payer: Self-pay | Admitting: Nephrology

## 2010-08-04 ENCOUNTER — Encounter (HOSPITAL_COMMUNITY): Payer: Medicare Other

## 2010-08-05 LAB — POCT HEMOGLOBIN-HEMACUE: Hemoglobin: 10.1 g/dL — ABNORMAL LOW (ref 12.0–15.0)

## 2010-08-19 ENCOUNTER — Encounter (HOSPITAL_COMMUNITY): Payer: Medicare Other | Attending: Nephrology

## 2010-08-19 ENCOUNTER — Other Ambulatory Visit: Payer: Self-pay | Admitting: Nephrology

## 2010-08-19 DIAGNOSIS — D638 Anemia in other chronic diseases classified elsewhere: Secondary | ICD-10-CM | POA: Insufficient documentation

## 2010-08-19 DIAGNOSIS — N183 Chronic kidney disease, stage 3 unspecified: Secondary | ICD-10-CM | POA: Insufficient documentation

## 2010-08-19 LAB — POCT HEMOGLOBIN-HEMACUE: Hemoglobin: 10 g/dL — ABNORMAL LOW (ref 12.0–15.0)

## 2010-08-24 ENCOUNTER — Encounter: Payer: Self-pay | Admitting: Family Medicine

## 2010-08-24 ENCOUNTER — Ambulatory Visit (INDEPENDENT_AMBULATORY_CARE_PROVIDER_SITE_OTHER): Payer: Medicare Other | Admitting: Family Medicine

## 2010-08-24 VITALS — BP 108/69 | HR 81 | Temp 97.9°F | Wt 196.0 lb

## 2010-08-24 DIAGNOSIS — I10 Essential (primary) hypertension: Secondary | ICD-10-CM

## 2010-08-24 DIAGNOSIS — F8189 Other developmental disorders of scholastic skills: Secondary | ICD-10-CM

## 2010-08-24 DIAGNOSIS — G4723 Circadian rhythm sleep disorder, irregular sleep wake type: Secondary | ICD-10-CM

## 2010-08-24 DIAGNOSIS — E119 Type 2 diabetes mellitus without complications: Secondary | ICD-10-CM

## 2010-08-24 DIAGNOSIS — E669 Obesity, unspecified: Secondary | ICD-10-CM

## 2010-08-24 MED ORDER — INSULIN GLARGINE 100 UNIT/ML ~~LOC~~ SOLN: 50.0000 [IU] | SUBCUTANEOUS | Status: DC

## 2010-08-24 MED ORDER — "INSULIN SYRINGE-NEEDLE U-100 30G X 1/2"" 0.5 ML MISC": Status: DC

## 2010-08-24 MED ORDER — INSULIN ASPART 100 UNIT/ML ~~LOC~~ SOLN: SUBCUTANEOUS | Status: DC

## 2010-08-24 NOTE — Assessment & Plan Note (Signed)
I advised only sleeping 8-9 hours a day, and being sure to eat lunch daily.

## 2010-08-24 NOTE — Progress Notes (Signed)
  Subjective:    Patient ID: [name], female    DOB: 05/24/1985, 25 y.o.   MRN: [id number]  HPI  [name] returns for follow up.  She reports her blood sugars have been high, usually fasting 170's to 230's.  She has had most afternoon blood sugars in high 200's or 300's, but has had a few in the 400's.  Lowest blood sugar in past 2 months 140.   She is taking 60 units of lantus once a day, 6 units of novolog with breakfast and 12 with dinner.  She usually does not eat lunch.  She says she usually sleeps during the daytime and is not awake for lunch.    [name] reports she still does not like giving her insulin in her belly, and often gives it in her arm.  She also says that she cannot afford the kind of foods she is supposed to eat (fruits and veggies), and often has to eat packaged meals and things like ramen noodles, which cause her blood sugar to be high.  She still complains that the transplant doctor will not let her stop the prednisone, which she feels is the main cause of her high blood sugar.    Patient often sleeps 12 or more hours a day.  She reports going to bed between 10pm and 2 am, but says she wakes up in the morning around 9, eats breakfast, takes her medications and goes back to sleep for several hours.  Because of this, she often does not eat lunch or take insulin in the middle of the day.  She goes from 9 am to 7pm without eating most days. She says this was her schedule for 10 years while she was on HD, and it is hard to stop doing that.   No problems with blood pressure medications, she is taking them as prescribed.   [name] is still doing some walking on her treadmill, but has not increased her frequency or length of exercise.  Weight is stable.   Review of Systems Pertinent items noted in HPI.     Objective:   Physical Exam BP 108/69  Pulse 81  Temp(Src) 97.9 F (36.6 C) (Oral)  Wt 196 lb (88.905 kg) General appearance: alert, cooperative and no distress Eyes:  conjunctivae/corneas clear. PERRL, EOM's intact. Fundi benign. Throat: lips, mucosa, and tongue normal; teeth and gums normal Neck: no adenopathy, supple, symmetrical, trachea midline and thyroid not enlarged, symmetric, no tenderness/mass/nodules Lungs: clear to auscultation bilaterally Heart: regular rate and rhythm, S1, S2 normal, no murmur, click, rub or gallop Abdomen: soft, non-tender; bowel sounds normal; no masses,  no organomegaly Extremities: extremities normal, atraumatic, no cyanosis or edema Pulses: 2+ and symmetric       Assessment & Plan:

## 2010-08-24 NOTE — Assessment & Plan Note (Signed)
Continue current medications. 

## 2010-08-24 NOTE — Assessment & Plan Note (Signed)
Patient's learning disability continues to interfere with her ability to manage her chronic medical conditions. Patient has difficulty understanding that it does not matter why her blood sugar is high, and often tries to blame prednisone and her financial situation for the reason it is high.  She had trouble conceptualizing that the reason it is high does not matter, but the fact that her blood sugar is high puts her at risk for MI, stroke, losing her kidney, eye damage, and amputation of limb.  I did review all of this with her again today.

## 2010-08-24 NOTE — Assessment & Plan Note (Addendum)
Patient continues to have difficulty with blood sugar control.  She has difficulty understanding the significance of her diabetes on her health.  Will increase patient's lantus to 80 units daily.  Have planned phone follow up for one week and will adjust as needed at that time. Will also refer pt back to Nutrition for assistance buying Diabetes appropriate foods she can afford.

## 2010-08-24 NOTE — Patient Instructions (Signed)
It was good to see you.  I want you to increase your Lantus to 80 units once a day.  I want you to try to only sleep 8-9 hours/24 hour period.  I want you to eat three meals a day, and take your novolog with each meal.  Please take 6 units with breakfast, 6 with lunch, and 12 with dinner.    I will call you next week and I am going to ask you what your blood sugars have been running, and we will adjust your insulin if necessary.

## 2010-08-24 NOTE — Assessment & Plan Note (Signed)
Weight is stable but patient having difficulty with frequent exercise and proper nutrition.  I believe her learning disability is making it hard for her to implement changes.

## 2010-08-31 ENCOUNTER — Telehealth: Payer: Self-pay | Admitting: Family Medicine

## 2010-08-31 NOTE — Telephone Encounter (Signed)
Called patient for follow up regarding blood sugars.  She is taking Lantus 60 Units once daily and Novolog 5 units with meals.  Am  Blood sugars 128-186  Pm 150's-300.    Patient reports she has only had one blood sugar as high as 300.  She has been making some changes in her diet since her visit last week.  She is seeing her transplant doctor next week and she is hoping he will decrease her prednisone, which she knows makes her blood sugar high.    Advised continuing Lantus and Novolog as she is taking them currently.  Have asked her to follow up in 6 weeks for Diabetes management.  Asked her to call for an appointment sooner if she has fasting blood sugars above 200.

## 2010-09-01 ENCOUNTER — Ambulatory Visit (INDEPENDENT_AMBULATORY_CARE_PROVIDER_SITE_OTHER): Payer: Medicare Other | Admitting: Family Medicine

## 2010-09-01 DIAGNOSIS — E119 Type 2 diabetes mellitus without complications: Secondary | ICD-10-CM

## 2010-09-01 DIAGNOSIS — N182 Chronic kidney disease, stage 2 (mild): Secondary | ICD-10-CM

## 2010-09-01 NOTE — Patient Instructions (Addendum)
-   For good prices on vegetables and fruit, check out the curb market on [address], and [employer] on [address].  Frozen vegetables are as nutritious as fresh.   - Juice is a concentrated sources of sugar and calories.  EAT your fruit rather than drinking it! - Goal:  Vegetables twice a day; with lunch and with dinner.   - Vegetables:  Try stir-frying greens, and finish with steaming, covered with a bit of water.   - Meats: Roast a [place] breast, and freeze.  Fresh meat is going to be lower in sodium and cheaper than deli meats.   - To help control blood sugars, limit starchy foods at each meal.  Aim for no more than 2 servings of starch foods per meal.  (One serving is equal to one slice of bread or a half-cup of any scoopable carb foods.) - Starchy foods = potatoes, corn, rice, pasta, bread, crackers, cereal, beans (which also have protein).   - If you have a very high or very low blood sugar, look at the last thing you ate, how much, and what time.  - Sweets should be considered SOMETIMES foods.  These are reserved for special occasions, and should be eaten in small quantities.   - Goal:  Walk at least 30 min 5 X wk.

## 2010-09-01 NOTE — Progress Notes (Signed)
Medical Nutrition Therapy:  Appt start time: [id number] end time:  1630.  Assessment:  Primary concerns today: Blood sugar control and chronic kidney disease.  [name] waas accompanied by her mom [name] today.  They share meals often, although do not live together.  Financial constraints represent one obstacle to better eating.  Usual eating pattern includes 2 meals and 1-3 snacks per day, although pattern is erratic.  [name] has continued on her routine of sleeping during the day, and sleeping at night, having become accustomed to that routine w/ previous dialysis [id number]).  Everyday foods include oodles of Noodles, spaghetti, fruit (at beginning of month), sandwiches, frozen dinners, water, 24 oz juice.  Avoided foods include bananas, shellfish (allergies).  24-hr recall (up ~9 AM): B (11:30 AM)- [place] stk, corn, mshed potatoes w/ gravy frozen dinner, water; L- none; D (7:30 PM)- 2 c frozen prepared alfredo pasta, 12 oz apple juice, 5 grapes; Snk ( PM)- freeze pop, water. Usual physical activity includes walking 30 min 3 X wk.  Reported FBG of 94-120, although questionable accuracy, as patient was equivocal.  Progress Towards Goal(s):  In progress.   Nutritional Diagnosis:  NB-2.1 Physical inactivity As related to poor motivation.  As evidenced by exercise limited to walking a couple of times a week. NI-5.8.3 Inappropriate intake of types of carbohydrates (specify): simple CHO As related to juice.  As evidenced by daily intake of 24 oz of juice.    Intervention:  Nutrition education.  Monitoring/Evaluation:  Dietary intake, exercise, blood sugars, and body weight in 1 month.

## 2010-09-02 ENCOUNTER — Other Ambulatory Visit: Payer: Self-pay | Admitting: Nephrology

## 2010-09-02 ENCOUNTER — Encounter (HOSPITAL_COMMUNITY): Payer: Medicare Other

## 2010-09-04 ENCOUNTER — Telehealth: Payer: Self-pay | Admitting: Family Medicine

## 2010-09-04 NOTE — Telephone Encounter (Signed)
Pt can't remember when she is to f/u up Dr Byrd Hesselbach.  Please advise

## 2010-09-04 NOTE — Telephone Encounter (Signed)
Starlene should follow up in 6-8 weeks.  Thanks.

## 2010-09-04 NOTE — Telephone Encounter (Signed)
PCP didn't specify in last ov, will forward to her.

## 2010-09-12 ENCOUNTER — Inpatient Hospital Stay (INDEPENDENT_AMBULATORY_CARE_PROVIDER_SITE_OTHER)
Admission: RE | Admit: 2010-09-12 | Discharge: 2010-09-12 | Disposition: A | Discharge status: Home / Self Care | Payer: Medicare Other | Source: Ambulatory Visit | Attending: Family Medicine | Admitting: Family Medicine

## 2010-09-12 DIAGNOSIS — T24039A Burn of unspecified degree of unspecified lower leg, initial encounter: Secondary | ICD-10-CM

## 2010-09-15 ENCOUNTER — Encounter (HOSPITAL_COMMUNITY): Payer: Medicare Other

## 2010-09-15 ENCOUNTER — Ambulatory Visit: Payer: Medicare Other | Admitting: Family Medicine

## 2010-09-17 ENCOUNTER — Other Ambulatory Visit: Payer: Self-pay | Admitting: Nephrology

## 2010-09-17 ENCOUNTER — Ambulatory Visit (INDEPENDENT_AMBULATORY_CARE_PROVIDER_SITE_OTHER): Payer: Medicare Other | Admitting: Family Medicine

## 2010-09-17 ENCOUNTER — Encounter (HOSPITAL_COMMUNITY)
Admission: RE | Admit: 2010-09-17 | Discharge: 2010-09-17 | Disposition: A | Discharge status: Home / Self Care | Payer: Medicare Other | Source: Ambulatory Visit | Attending: Nephrology | Admitting: Nephrology

## 2010-09-17 ENCOUNTER — Encounter: Payer: Self-pay | Admitting: Family Medicine

## 2010-09-17 VITALS — BP 135/89 | HR 73 | Temp 98.0°F | Ht 65.25 in | Wt 199.8 lb

## 2010-09-17 DIAGNOSIS — T24339A Burn of third degree of unspecified lower leg, initial encounter: Secondary | ICD-10-CM

## 2010-09-17 DIAGNOSIS — D638 Anemia in other chronic diseases classified elsewhere: Secondary | ICD-10-CM | POA: Insufficient documentation

## 2010-09-17 DIAGNOSIS — N183 Chronic kidney disease, stage 3 unspecified: Secondary | ICD-10-CM | POA: Insufficient documentation

## 2010-09-17 DIAGNOSIS — E119 Type 2 diabetes mellitus without complications: Secondary | ICD-10-CM

## 2010-09-17 MED ORDER — SILVER SULFADIAZINE 1 % EX CREA: TOPICAL_CREAM | CUTANEOUS | Status: AC

## 2010-09-17 NOTE — Assessment & Plan Note (Signed)
Improving per report.  Continue silvadene and washing with soap.  Gave red flags for return.  Do not use scar improvement cream for at least 6 months.   RTC in 2 weeks for recheck.
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
## 2010-09-29 ENCOUNTER — Ambulatory Visit: Payer: Medicare Other | Admitting: Family Medicine

## 2010-09-29 ENCOUNTER — Encounter (HOSPITAL_COMMUNITY): Payer: Medicare Other

## 2010-09-29 ENCOUNTER — Other Ambulatory Visit: Payer: Self-pay | Admitting: Nephrology

## 2010-09-29 LAB — POCT HEMOGLOBIN-HEMACUE: Hemoglobin: 10 g/dL — ABNORMAL LOW (ref 12.0–15.0)

## 2010-10-08 ENCOUNTER — Ambulatory Visit: Payer: Medicare Other | Admitting: Family Medicine

## 2010-10-08 ENCOUNTER — Encounter: Payer: Self-pay | Admitting: Family Medicine

## 2010-10-08 ENCOUNTER — Ambulatory Visit (INDEPENDENT_AMBULATORY_CARE_PROVIDER_SITE_OTHER): Payer: Medicare Other | Admitting: Family Medicine

## 2010-10-08 VITALS — Ht 65.25 in | Wt 195.9 lb

## 2010-10-08 DIAGNOSIS — E119 Type 2 diabetes mellitus without complications: Secondary | ICD-10-CM

## 2010-10-08 NOTE — Progress Notes (Signed)
Medical Nutrition Therapy:  Appt start time: 1400 end time:  1500.  Assessment:  Primary concerns today: Blood sugar control.  [name] was again accompanied by her mom at today's appt.  They have made some dietary changes, including having purchased a shot glass, which is the volume of juice [name] is allowed at a time now.  FBG have been running low-100s to 200, with most toward the 200s.  [name] did not know what food choices lead to either high or low FBG.  Although we discussed specific food goals at last appt, she does not seem to be making conscious choices at mealtimes, i.e.,  lunch yesterday was 3-4 grapes, 1/4 c raisins, and 4 potato chips.  [name] said she sometimes adjusts her Novolog at mealtimes according to her BG, but despite asking in different ways, it is still unclear to me how she is making the decision as to how many units to use.  There seems to be little understanding of diabetic dietary principles.  24-hr recall: B (9:30 AM)- 1 egg sandwich, carrots, 2 oz tomato juice; L (1:30 PM)- 3-4 grapes, 1/4 c raisins, 4 potato chips, water; D ( PM)- Banquet chx pot pie, water, handful grapes.  FBG 198 this am.    Progress Towards Goal(s):  In progress; some progress noted on Goal 2 below:   Nutritional Diagnosis:  NB-2.1 Physical inactivity As related to poor motivation.  As evidenced by exercise limited to walking a couple times a week. NI-5.8.3 Inappropriate intake of types of carbohydrates (specify): simple CHO As related to juice.  As evidenced by marked reduction in juice intake (no more than 1-2 oz at a time).    Intervention:  Nutrition education.  Monitoring/Evaluation:  Dietary intake, exercise, CBGs, and body weight in as needed following classes and/or Diabetes Self-Mgmt Training (DSMT) at [facility]

## 2010-10-08 NOTE — Patient Instructions (Addendum)
-   When your fasting blood sugar is below 95 or above 160, please write down what you ate in the last 5 hours before going to bed.  Include the time you ate, and how much of each food/drink.   - To eat more iron, include more lean red meat, and use the list of foods you got at the hospital.  The amount of red meat that is an appropriate portion is ~4 -5 ounces per serving, which is about the size of your palm.  If you eat foods with iron that are not meat, then eating vitamin C sources with that food helps the iron to be absorbed.  Good sources of vitamin C include citrus, tomatoes, greens, peppers, melon, and most berries.  - To look at the nutrient contents of foods, go to https://ball-rodriguez.info/.  Or another website is MonsterArms.gl.  - Carbohydrates:  Limit your starchy foods to 1-2 portions per meal, and a total of 30-45 grams of carb per meal.   - If you are interested in dairy milk alternatives, look for ones that have about 8 grams of protein per cup. - Reminder:  Obtain twice as many veg's as protein or carbohydrate foods for both lunch and dinner. - Read pamphlet provided today about carb counting.   - Recommend:  Classes at [facility]:  [id number].   - Call Dr. Jenne Campus if you have Qs:  [phone].

## 2010-10-13 ENCOUNTER — Ambulatory Visit: Payer: Medicare Other | Admitting: Family Medicine

## 2010-10-14 ENCOUNTER — Encounter (HOSPITAL_COMMUNITY): Payer: Medicare Other | Attending: Nephrology

## 2010-10-14 ENCOUNTER — Other Ambulatory Visit: Payer: Self-pay | Admitting: Nephrology

## 2010-10-14 DIAGNOSIS — N183 Chronic kidney disease, stage 3 unspecified: Secondary | ICD-10-CM | POA: Insufficient documentation

## 2010-10-14 DIAGNOSIS — D638 Anemia in other chronic diseases classified elsewhere: Secondary | ICD-10-CM | POA: Insufficient documentation

## 2010-10-14 LAB — POCT HEMOGLOBIN-HEMACUE: Hemoglobin: 10.9 g/dL — ABNORMAL LOW (ref 12.0–15.0)

## 2010-10-21 ENCOUNTER — Ambulatory Visit (INDEPENDENT_AMBULATORY_CARE_PROVIDER_SITE_OTHER): Payer: Medicare Other | Admitting: Family Medicine

## 2010-10-21 ENCOUNTER — Encounter: Payer: Self-pay | Admitting: Family Medicine

## 2010-10-21 VITALS — BP 131/89 | HR 86 | Temp 98.1°F | Wt 199.9 lb

## 2010-10-21 DIAGNOSIS — E119 Type 2 diabetes mellitus without complications: Secondary | ICD-10-CM

## 2010-10-21 DIAGNOSIS — T24339A Burn of third degree of unspecified lower leg, initial encounter: Secondary | ICD-10-CM

## 2010-10-21 DIAGNOSIS — I10 Essential (primary) hypertension: Secondary | ICD-10-CM

## 2010-10-21 DIAGNOSIS — E669 Obesity, unspecified: Secondary | ICD-10-CM

## 2010-10-21 MED ORDER — SULFAMETHOXAZOLE-TMP DS 800-160 MG PO TABS: 1.0000 | ORAL_TABLET | Freq: Two times a day (BID) | ORAL | Status: DC

## 2010-10-21 NOTE — Assessment & Plan Note (Signed)
Pt gained weight, discussed exercising today. Continue to encourage.
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
## 2010-10-21 NOTE — Assessment & Plan Note (Signed)
Concerned this will become chronic non-healing ulcer in setting of poorly controlled DM.  Pt will not take Doxy.  Will RX double strength TMP/SMX, hold prophylactic (renal transplant) TMP/SMX for two weeks.  Follow up in two weeks, reviewed signs/symptoms to return earlier.

## 2010-10-21 NOTE — Assessment & Plan Note (Signed)
Well controlled, continue current mediations.

## 2010-10-21 NOTE — Assessment & Plan Note (Signed)
Continues to be poorly controlled, but modest improvement from last A1c.  Explained why pt should see DM nutrition specialists, advised pt to go.  Explained carb counting and adjusting SSI could help with control.

## 2010-10-21 NOTE — Patient Instructions (Signed)
It was good to see you today.  Your Hemoglobin A1C is  Lab Results  Component Value Date   HGBA1C 10.4 10/21/2010  .  Remember your goal for A1C is less than 7.  Your goal for fasting morning blood sugar is 80-120.    I want you to call for Classes at the [facility]: [phone], as recommended by Dr. Jenne Campus.  It is very important to get your Diabetes under better control, especially now that you have a wound on your leg.  I have sent a prescription to your pharmacy for double-strength Septra, I want you to take that twice a day for two weeks, and hold your regular septra doses during those two weeks. I also recommend you keep using the Silvadene cream or another over the counter topical antibiotic on the cut on your leg.   Please make an appointment to come back and see me in two weeks to check and make sure your leg is getting better.

## 2010-10-23 ENCOUNTER — Other Ambulatory Visit: Payer: Self-pay | Admitting: Family Medicine

## 2010-10-25 NOTE — Telephone Encounter (Signed)
Refill request

## 2010-10-28 ENCOUNTER — Other Ambulatory Visit: Payer: Self-pay | Admitting: Nephrology

## 2010-10-28 ENCOUNTER — Encounter (HOSPITAL_COMMUNITY): Payer: Medicare Other

## 2010-10-28 LAB — POCT HEMOGLOBIN-HEMACUE: Hemoglobin: 10.9 g/dL — ABNORMAL LOW (ref 12.0–15.0)

## 2010-11-11 ENCOUNTER — Encounter: Payer: Self-pay | Admitting: *Deleted

## 2010-11-11 ENCOUNTER — Encounter: Payer: Medicare Other | Attending: Family Medicine | Admitting: *Deleted

## 2010-11-11 ENCOUNTER — Other Ambulatory Visit: Payer: Self-pay | Admitting: Nephrology

## 2010-11-11 ENCOUNTER — Encounter (HOSPITAL_COMMUNITY): Payer: Medicare Other

## 2010-11-11 DIAGNOSIS — E119 Type 2 diabetes mellitus without complications: Secondary | ICD-10-CM

## 2010-11-11 DIAGNOSIS — Z713 Dietary counseling and surveillance: Secondary | ICD-10-CM | POA: Insufficient documentation

## 2010-11-11 LAB — POCT HEMOGLOBIN-HEMACUE: Hemoglobin: 10.8 g/dL — ABNORMAL LOW (ref 12.0–15.0)

## 2010-11-11 NOTE — Patient Instructions (Addendum)
Goals:  Avoid liquid forms of sugar (juices, sweet tea, etc)  Eat 3 meals and 2 snacks, every 3-5 hrs  Limit carbohydrate intake to 30-45 grams carbohydrate/meal  Limit carbohydrate intake to 10-15 grams carbohydrate/snack  Add lean protein foods to meals/snacks  Monitor glucose levels daily (FBS, 2 hrs after meals)  Aim for 20-30 mins of physical activity daily  Bring food record and glucose log to your next nutrition visit

## 2010-11-11 NOTE — Progress Notes (Signed)
  Medical Nutrition Therapy:  Appt start time: 1630 end time:  1730.   Assessment:  Primary concerns today: Type 2 Diabetes Management. Pt reports upon the beginning of her visit that she does not like group visits. She also states that she does not believe this diabetes is real for her. She has not accepted her diagnosis. She reports that her kidney problems are related to her kidney transplant and not related to her diabetes. She feels the prednisone is cause of her diabetes onset. Naje does report that she is motivated to take care of herself.  MEDICATIONS: See medication list. Pt on Novolog and Lantus for diabetes. She reports 100% compliance with her insulin. The only dose variations she reports are when she sleeps in late.   DIETARY INTAKE:  Usual eating pattern includes 2-3 meals and 0-2 snacks per day.  24-hr recall:  B (8 AM): water, OJ (6oz) OR 1 orange with 2 eggs scrammbled Snk ( AM): ---- L (12-3 PM): Handful of grapes OR 1 1/2 cup cornflakes w/ Splenda, skim milk Snk ( PM): ---- D ( PM): Fish (baked), cole slaw, mashed potatoes, peas OR Spaghetti w/ meat sauce (2 cups) OR Ramen noodles (1 package) Snk ( PM): Ramen noodles (1 package) OR several handfuls of grapes OR 6-8 oz OJ  Usual physical activity: "Most of the time I don't exercise because if I'm out shopping, I'm getting enough exercise". No structured exercise performs. Occasionally she will walk on the treadmill.  Lab Results  Component Value Date   HGBA1C 10.4 10/21/2010   CBG monitoring: Checking 2-3 times perday Avg CBG: FBS- 80-150, Preprandial- 200-300, Bedtime- 300+  Estimated energy needs: 1600 calories 180 g carbohydrates 70-80 g protein 50-55 g fat  Progress Towards Goal(s):  In progress.   Nutritional Diagnosis:  NI-5.8.2 Excessive carbohydrate intake As related to large portion sizes.  As evidenced by estimated carbohydrate intake >250-300g per day (140% estimated needs)..    Intervention:   Nutrition education.  Handouts given during visit include:  Carbohydrate counting handout  HgbA1c Handout  Monitoring/Evaluation:  Dietary intake, exercise, blood glucose levels, and body weight prn.

## 2010-11-18 ENCOUNTER — Ambulatory Visit (INDEPENDENT_AMBULATORY_CARE_PROVIDER_SITE_OTHER): Payer: Medicare Other | Admitting: Family Medicine

## 2010-11-18 ENCOUNTER — Encounter: Payer: Self-pay | Admitting: Family Medicine

## 2010-11-18 ENCOUNTER — Telehealth: Payer: Self-pay | Admitting: Family Medicine

## 2010-11-18 DIAGNOSIS — E119 Type 2 diabetes mellitus without complications: Secondary | ICD-10-CM

## 2010-11-18 DIAGNOSIS — E669 Obesity, unspecified: Secondary | ICD-10-CM

## 2010-11-18 DIAGNOSIS — T24339A Burn of third degree of unspecified lower leg, initial encounter: Secondary | ICD-10-CM

## 2010-11-18 DIAGNOSIS — I10 Essential (primary) hypertension: Secondary | ICD-10-CM

## 2010-11-18 MED ORDER — INSULIN GLARGINE 100 UNIT/ML ~~LOC~~ SOLN: 60.0000 [IU] | SUBCUTANEOUS | Status: DC

## 2010-11-18 MED ORDER — INSULIN ASPART 100 UNIT/ML ~~LOC~~ SOLN: SUBCUTANEOUS | Status: DC

## 2010-11-18 NOTE — Assessment & Plan Note (Signed)
Not at goal, but patient seems motivated today.  Will start novolog with breakfast, and have asked patient to discuss with her diabetes nutritionist how to adjust the amount of novolog she takes for the amount of carbs she eats.

## 2010-11-18 NOTE — Assessment & Plan Note (Signed)
At goal, continue current medications
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
## 2010-11-18 NOTE — Patient Instructions (Signed)
I want you to take some Novolog with breakfast when you eat breakfast.  If you have a small breakfast, take 4 units of Novolog.  If you have a big breakfast, take 8 units.    Please keep a close eye on your leg, if it becomes more painful, bleeds a lot, or is draining a lot of puss, please call the office, you need to be seen.

## 2010-11-18 NOTE — Assessment & Plan Note (Signed)
Patient has lost a few lbs since last visit.  She has been using her nutrition book and looking at the calories, fat, and carbs in everything she eats.  She is making an effort to buy more healthy foods despite her tight budget.

## 2010-11-18 NOTE — Assessment & Plan Note (Signed)
Improved, but slowly healing.  Pt is to finish TMP/SMX and go back to prophylactic dose.  She knows to keep it clean and to see a doctor if it is getting worse not better.

## 2010-11-18 NOTE — Telephone Encounter (Signed)
Needs clarification on the vial # for Lantus.  She normally gets 2, but the Rx was only written for 1.  [facility] is waiting on a return call.

## 2010-11-18 NOTE — Telephone Encounter (Signed)
Sent in another Rx for two vials.

## 2010-11-25 ENCOUNTER — Ambulatory Visit: Payer: Medicare Other | Admitting: *Deleted

## 2010-11-25 ENCOUNTER — Other Ambulatory Visit: Payer: Self-pay | Admitting: Nephrology

## 2010-11-25 ENCOUNTER — Encounter (HOSPITAL_COMMUNITY): Payer: Medicare Other | Attending: Nephrology

## 2010-11-25 DIAGNOSIS — N183 Chronic kidney disease, stage 3 unspecified: Secondary | ICD-10-CM | POA: Insufficient documentation

## 2010-11-25 DIAGNOSIS — D638 Anemia in other chronic diseases classified elsewhere: Secondary | ICD-10-CM | POA: Insufficient documentation

## 2010-11-25 LAB — POCT HEMOGLOBIN-HEMACUE: Hemoglobin: 11.2 g/dL — ABNORMAL LOW (ref 12.0–15.0)

## 2010-12-09 ENCOUNTER — Other Ambulatory Visit: Payer: Self-pay | Admitting: Nephrology

## 2010-12-09 ENCOUNTER — Encounter (HOSPITAL_COMMUNITY)
Admission: RE | Admit: 2010-12-09 | Discharge: 2010-12-09 | Payer: Medicare Other | Source: Ambulatory Visit | Attending: Nephrology | Admitting: Nephrology

## 2010-12-09 LAB — POCT HEMOGLOBIN-HEMACUE: Hemoglobin: 10.8 g/dL — ABNORMAL LOW (ref 12.0–15.0)

## 2010-12-11 LAB — GLUCOSE, CAPILLARY
Glucose-Capillary: 150 — ABNORMAL HIGH
Glucose-Capillary: 228 — ABNORMAL HIGH
Glucose-Capillary: 245 — ABNORMAL HIGH
Glucose-Capillary: 283 — ABNORMAL HIGH
Glucose-Capillary: 375 — ABNORMAL HIGH
Glucose-Capillary: 567
Glucose-Capillary: >600
Glucose-Capillary: >600

## 2010-12-11 LAB — CULTURE, BLOOD (ROUTINE X 2): Culture: NO GROWTH

## 2010-12-11 LAB — CBC
HCT: 29.1 — ABNORMAL LOW
Hemoglobin: 9.9 — ABNORMAL LOW
MCV: 92.2
RBC: 3.15 — ABNORMAL LOW
WBC: 9.4

## 2010-12-11 LAB — POCT I-STAT 3, VENOUS BLOOD GAS (G3P V)
Bicarbonate: 27.2 — ABNORMAL HIGH
TCO2: 29
pH, Ven: 7.347 — ABNORMAL HIGH
pO2, Ven: 16 — CL

## 2010-12-11 LAB — URINALYSIS, ROUTINE W REFLEX MICROSCOPIC
Bilirubin Urine: NEGATIVE
Hgb urine dipstick: NEGATIVE
Ketones, ur: NEGATIVE
Nitrite: NEGATIVE
Specific Gravity, Urine: 1.035 — ABNORMAL HIGH
Urobilinogen, UA: 0.2

## 2010-12-11 LAB — BASIC METABOLIC PANEL
Chloride: 105
GFR calc Af Amer: 59 — ABNORMAL LOW
Potassium: 3.9
Sodium: 133 — ABNORMAL LOW

## 2010-12-11 LAB — POCT I-STAT, CHEM 8
BUN: 42 — ABNORMAL HIGH
Calcium, Ion: 1.29
Chloride: 96
Creatinine, Ser: 1.7 — ABNORMAL HIGH
Sodium: 127 — ABNORMAL LOW

## 2010-12-11 LAB — POCT I-STAT 4, (NA,K, GLUC, HGB,HCT)
Glucose, Bld: 286 — ABNORMAL HIGH
Potassium: 4

## 2010-12-11 LAB — POCT PREGNANCY, URINE: Preg Test, Ur: NEGATIVE

## 2010-12-11 LAB — URINE MICROSCOPIC-ADD ON

## 2010-12-14 LAB — HEMOGLOBIN AND HEMATOCRIT, BLOOD: HCT: 35.4 — ABNORMAL LOW

## 2010-12-15 LAB — POCT HEMOGLOBIN-HEMACUE: Hemoglobin: 11 g/dL — ABNORMAL LOW (ref 12.0–15.0)

## 2010-12-17 LAB — IRON AND TIBC: TIBC: 255 ug/dL (ref 250–470)

## 2010-12-17 LAB — FERRITIN: Ferritin: 712 ng/mL — ABNORMAL HIGH (ref 10–291)

## 2010-12-17 LAB — POCT HEMOGLOBIN-HEMACUE: Hemoglobin: 10.9 g/dL — ABNORMAL LOW (ref 12.0–15.0)

## 2010-12-22 ENCOUNTER — Other Ambulatory Visit: Payer: Self-pay | Admitting: Nephrology

## 2010-12-22 ENCOUNTER — Encounter (HOSPITAL_COMMUNITY): Payer: Medicare Other | Attending: Nephrology

## 2010-12-22 DIAGNOSIS — D638 Anemia in other chronic diseases classified elsewhere: Secondary | ICD-10-CM | POA: Insufficient documentation

## 2010-12-22 DIAGNOSIS — N183 Chronic kidney disease, stage 3 unspecified: Secondary | ICD-10-CM | POA: Insufficient documentation

## 2011-01-01 ENCOUNTER — Ambulatory Visit (INDEPENDENT_AMBULATORY_CARE_PROVIDER_SITE_OTHER): Payer: Medicare Other | Admitting: Family Medicine

## 2011-01-01 ENCOUNTER — Encounter: Payer: Self-pay | Admitting: Family Medicine

## 2011-01-01 VITALS — BP 134/89 | HR 91 | Temp 98.1°F | Ht 65.5 in | Wt 198.0 lb

## 2011-01-01 DIAGNOSIS — R112 Nausea with vomiting, unspecified: Secondary | ICD-10-CM | POA: Insufficient documentation

## 2011-01-01 DIAGNOSIS — R300 Dysuria: Secondary | ICD-10-CM

## 2011-01-01 DIAGNOSIS — R197 Diarrhea, unspecified: Secondary | ICD-10-CM | POA: Insufficient documentation

## 2011-01-01 LAB — POCT URINALYSIS DIPSTICK
Blood, UA: NEGATIVE
Glucose, UA: 100
Ketones, UA: NEGATIVE
Protein, UA: NEGATIVE
Spec Grav, UA: 1.02
Urobilinogen, UA: 0.2

## 2011-01-01 MED ORDER — ONDANSETRON HCL 4 MG PO TABS: 4.0000 mg | ORAL_TABLET | Freq: Two times a day (BID) | ORAL | Status: DC | PRN

## 2011-01-01 NOTE — Assessment & Plan Note (Signed)
Unclear etiology at this point. I think this is likely to be viral gastroenteritis. Plan to treat conservatively with Zofran as needed for vomiting. Carefully reviewed red flag precautions with patient including in handout. She will return to clinic Monday or Tuesday she's not feeling better.

## 2011-01-01 NOTE — Progress Notes (Signed)
Ms. Castrellon presents to clinic today complaining of vomiting and mild abdominal pain for 2 days. She's had occasional nausea and vomiting with 4 stools a day. She normally has 4 stools a day. She denies any blood in her stool fever chills intense abdominal pain or reflux. She does note some dysuria. She has no new medications recently. She continues to take her UTI prophylaxis as part of her kidney transplant.  PMH reviewed.  ROS as above otherwise neg Medications reviewed.  Exam:  BP 134/89  Pulse 91  Temp(Src) 98.1 F (36.7 C) (Oral)  Ht 5' 5.5" (1.664 m)  Wt 198 lb (89.812 kg)  BMI 32.45 kg/m2 Gen: Well NAD, nontoxic appearing HEENT: EOMI,  MMM Lungs: CTABL Nl WOB Heart: RRR no MRG Abd: NABS, NT, ND, no masses Exts: Non edematous BL  LE, warm and well perfused.   Urinalysis: 100+ glucose otherwise normal

## 2011-01-01 NOTE — Patient Instructions (Signed)
Thank you for coming in today. I think you have a stomach virus.  It should get better in a few days.  Eat and drink light things. But you can eat pretty much what your normally eat as long as you dong get sick.  If you stop passing gas or stools and start throwing up let me know.  Let me know if your pain gets worse or you have fevers or chills.  If you are not feeling better my Monday or Tuesday come back.   Viral Gastroenteritis Viral gastroenteritis is also known as stomach flu. This condition affects the stomach and intestinal tract. The illness typically lasts 3 to 8 days. Most people develop an immune response. This eventually gets rid of the virus. While this natural response develops, the virus can make you quite ill.   CAUSES   Diarrhea and vomiting are often caused by a virus. Medicines (antibiotics) that kill germs will not help unless there is also a germ (bacterial) infection. SYMPTOMS   The most common symptom is diarrhea. This can cause severe loss of fluids (dehydration) and body salt (electrolyte) imbalance. TREATMENT   Treatments for this illness are aimed at rehydration. Antidiarrheal medicines are not recommended. They do not decrease diarrhea volume and may be harmful. Usually, home treatment is all that is needed. The most serious cases involve vomiting so severely that you are not able to keep down fluids taken by mouth (orally). In these cases, intravenous (IV) fluids are needed. Vomiting with viral gastroenteritis is common, but it will usually go away with treatment. HOME CARE INSTRUCTIONS   Small amounts of fluids should be taken frequently. Large amounts at one time may not be tolerated. Plain water may be harmful in infants and the elderly. Oral rehydration solutions (ORS) are available at pharmacies and grocery stores. ORS replace water and important electrolytes in proper proportions. Sports drinks are not as effective as ORS and may be harmful due to sugars  worsening diarrhea.  As a general guideline for children, replace any new fluid losses from diarrhea or vomiting with ORS as follows:     If your child weighs 22 pounds or under (10 kg or less), give 60-120 mL (1/4 - 1/2 cup or 2 - 4 ounces) of ORS for each diarrheal stool or vomiting episode.     If your child weighs more than 22 pounds (more than 10 kgs), give 120-240 mL (1/2 - 1 cup or 4 - 8 ounces) of ORS for each diarrheal stool or vomiting episode.     In a child with vomiting, it may be helpful to give the above ORS replacement in 5 mL (1 teaspoon) amounts every 5 minutes, then increase as tolerated.     While correcting for dehydration, children should eat normally. However, foods high in sugar should be avoided because this may worsen diarrhea. Large amounts of carbonated soft drinks, juice, gelatin desserts, and other highly sugared drinks should be avoided.     After correction of dehydration, other liquids that are appealing to the child may be added. Children should drink small amounts of fluids frequently and fluids should be increased as tolerated.     Adults should eat normally while drinking more fluids than usual. Drink small amounts of fluids frequently and increase as tolerated. Drink enough water and fluids to keep your urine clear or pale yellow. Broths, weak decaffeinated tea, lemon-lime soft drinks (allowed to go flat), and ORS replace fluids and electrolytes.  Avoid:    Carbonated drinks.     Juice.    Extremely hot or cold fluids.     Caffeine drinks.     Fatty, greasy foods.     Alcohol.    Tobacco.    Too much intake of anything at one time.     Gelatin desserts.     Probiotics are active cultures of beneficial bacteria. They may lessen the amount and number of diarrheal stools in adults. Probiotics can be found in yogurt with active cultures and in supplements.     Wash your hands well to avoid spreading bacteria and viruses.     Antidiarrheal  medicines are not recommended for infants and children.     Only take over-the-counter or prescription medicines for pain, discomfort, or fever as directed by your caregiver. Do not give aspirin to children.     For adults with dehydration, ask your caregiver if you should continue all prescribed and over-the-counter medicines.     If your caregiver has given you a follow-up appointment, it is very important to keep that appointment. Not keeping the appointment could result in a lasting (chronic) or permanent injury and disability. If there is any problem keeping the appointment, you must call to reschedule.  SEEK IMMEDIATE MEDICAL CARE IF:    You are unable to keep fluids down.     There is no urine output in 6 to 8 hours or there is only a small amount of very dark urine.     You develop shortness of breath.     There is blood in the vomit (may look like coffee grounds) or stool.     Belly (abdominal) pain develops, increases, or localizes.     There is persistent vomiting or diarrhea.     You have a fever.     Your baby is older than 3 months with a rectal temperature of 102 F (38.9 C) or higher.     Your baby is 59 months old or younger with a rectal temperature of 100.4 F (38 C) or higher.  MAKE SURE YOU:    Understand these instructions.     Will watch your condition.     Will get help right away if you are not doing well or get worse.  Document Released: 03/01/2005 Document Revised: 11/11/2010 Document Reviewed: 07/13/2006 [facility] Patient Information 2012 [place].

## 2011-01-01 NOTE — Assessment & Plan Note (Signed)
I do not think this is due to urinary tract infection. We'll follow if not improved by Monday or Tuesday

## 2011-01-05 ENCOUNTER — Encounter (HOSPITAL_COMMUNITY): Payer: Medicare Other

## 2011-01-05 ENCOUNTER — Other Ambulatory Visit: Payer: Self-pay | Admitting: Nephrology

## 2011-01-05 LAB — POCT HEMOGLOBIN-HEMACUE: Hemoglobin: 10.3 g/dL — ABNORMAL LOW (ref 12.0–15.0)

## 2011-01-11 ENCOUNTER — Ambulatory Visit: Payer: Medicare Other | Admitting: *Deleted

## 2011-01-15 ENCOUNTER — Other Ambulatory Visit (HOSPITAL_COMMUNITY): Payer: Self-pay | Admitting: *Deleted

## 2011-01-19 ENCOUNTER — Encounter (HOSPITAL_COMMUNITY): Payer: Medicare Other

## 2011-01-19 DIAGNOSIS — D638 Anemia in other chronic diseases classified elsewhere: Secondary | ICD-10-CM | POA: Insufficient documentation

## 2011-01-19 DIAGNOSIS — N183 Chronic kidney disease, stage 3 unspecified: Secondary | ICD-10-CM | POA: Insufficient documentation

## 2011-01-19 LAB — POCT HEMOGLOBIN-HEMACUE: Hemoglobin: 10.7 g/dL — ABNORMAL LOW (ref 12.0–15.0)

## 2011-01-19 MED ORDER — EPOETIN ALFA 10000 UNIT/ML IJ SOLN
20000.0000 [IU] | INTRAMUSCULAR | Status: AC
  Administered 2011-01-19: 20000 [IU] via SUBCUTANEOUS

## 2011-01-21 MED FILL — Epoetin Alfa Inj 20000 Unit/ML: INTRAMUSCULAR | Qty: 1 | Status: AC

## 2011-01-22 ENCOUNTER — Ambulatory Visit: Payer: Medicare Other | Admitting: Family Medicine

## 2011-01-22 ENCOUNTER — Other Ambulatory Visit: Payer: Self-pay | Admitting: Family Medicine

## 2011-01-22 DIAGNOSIS — E119 Type 2 diabetes mellitus without complications: Secondary | ICD-10-CM

## 2011-01-22 MED ORDER — INSULIN ASPART 100 UNIT/ML ~~LOC~~ SOLN: SUBCUTANEOUS | Status: DC

## 2011-01-27 ENCOUNTER — Ambulatory Visit (INDEPENDENT_AMBULATORY_CARE_PROVIDER_SITE_OTHER): Payer: Medicare Other | Admitting: Family Medicine

## 2011-01-27 ENCOUNTER — Encounter: Payer: Self-pay | Admitting: Family Medicine

## 2011-01-27 VITALS — BP 135/94 | HR 99 | Temp 98.9°F | Ht 65.5 in | Wt 193.1 lb

## 2011-01-27 DIAGNOSIS — F8189 Other developmental disorders of scholastic skills: Secondary | ICD-10-CM

## 2011-01-27 DIAGNOSIS — I10 Essential (primary) hypertension: Secondary | ICD-10-CM

## 2011-01-27 DIAGNOSIS — E119 Type 2 diabetes mellitus without complications: Secondary | ICD-10-CM

## 2011-01-27 DIAGNOSIS — T24339A Burn of third degree of unspecified lower leg, initial encounter: Secondary | ICD-10-CM

## 2011-01-27 NOTE — Assessment & Plan Note (Signed)
Hg A1C >14.  Patient with difficulty understanding importance of glucose control.  Advised she go back to the Diabetes nutritionist.  Will try increasing her novolog with meal time, keep lantus at 80 for now.  She should follow up in 1 month to see if she has had improvement in fasting and post-prandial sugars.

## 2011-01-27 NOTE — Assessment & Plan Note (Signed)
[name] has trouble understanding the dangers of diabetes being poorly controlled, as well as the concept that those consequences are still there, no matter the cause of the high blood sugar.  She seems to think her blood sugar goes up only when she takes prednisone, and so she thinks it is OK.  Discussed again today that high blood sugar is dangerous no matter the cause.
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
## 2011-01-27 NOTE — Assessment & Plan Note (Signed)
Healed, no further management needed.

## 2011-01-27 NOTE — Patient Instructions (Signed)
It was good to see you today.  Your Hemoglobin A1C is  Lab Results  Component Value Date   HGBA1C >14.0 01/27/2011  .  Remember your goal for A1C is less than 7.  Your goal for fasting morning blood sugar is 80-120.    I want you to increase your novolog to 20 units with each meal.  Anytime you eat more food than the size of the palm of your hand, take 20 units.    I would like you to see the Nutritionist again and learn how to count carbs and adjust a sliding scale of insulin.    For your cold, you should try using nasal saline spray and a humidifier to loosen up the congestion.  You can also try herbal tea with honey in it.

## 2011-01-27 NOTE — Assessment & Plan Note (Signed)
Well controlled, continue current medication.

## 2011-02-02 ENCOUNTER — Encounter (HOSPITAL_COMMUNITY)
Admission: RE | Admit: 2011-02-02 | Discharge: 2011-02-02 | Disposition: A | Discharge status: Home / Self Care | Payer: Medicare Other | Source: Ambulatory Visit | Attending: Nephrology | Admitting: Nephrology

## 2011-02-02 LAB — POCT HEMOGLOBIN-HEMACUE: Hemoglobin: 11.1 g/dL — ABNORMAL LOW (ref 12.0–15.0)

## 2011-02-02 MED ORDER — EPOETIN ALFA 20000 UNIT/ML IJ SOLN
INTRAMUSCULAR | Status: AC
  Administered 2011-02-02: 10:00:00
  Filled 2011-02-02: qty 1

## 2011-02-05 ENCOUNTER — Telehealth: Payer: Self-pay | Admitting: Family Medicine

## 2011-02-05 NOTE — Telephone Encounter (Signed)
Woke-up early this morning say that her legs were cramping. Mother gave her potassium tablet since it sometimes gets low. Helped the cramps. Then took Tylenol.   Thinks possibly may be due to wearing high heeled shoes yesterday.  ROS: denies chest pain or palpitations but sometimes feels her "heart beating fast". Denies difficulty breathing.   May be due to low potassium or muscle spasms/soreness after wearing high-heeled shoes yesterday.  Recommended Tylenol for pain, warm compresses and stretching her legs throughout today.  Given red flags to go to the ED: chest pain, palpitations. If her symptoms are stable, then recommend she follow-up at [facility] on Monday 11/26 to be seen and possibly get her potassium checked at that time. She does not have any potassium labs in EPIC.   Patient and mother amenable to plan.

## 2011-02-16 ENCOUNTER — Encounter (HOSPITAL_COMMUNITY)
Admission: RE | Admit: 2011-02-16 | Discharge: 2011-02-16 | Disposition: A | Discharge status: Home / Self Care | Payer: Medicare Other | Source: Ambulatory Visit | Attending: Nephrology | Admitting: Nephrology

## 2011-02-16 DIAGNOSIS — D638 Anemia in other chronic diseases classified elsewhere: Secondary | ICD-10-CM | POA: Insufficient documentation

## 2011-02-16 DIAGNOSIS — N183 Chronic kidney disease, stage 3 unspecified: Secondary | ICD-10-CM | POA: Insufficient documentation

## 2011-02-16 LAB — POCT HEMOGLOBIN-HEMACUE: Hemoglobin: 10.6 g/dL — ABNORMAL LOW (ref 12.0–15.0)

## 2011-02-16 MED ORDER — EPOETIN ALFA 10000 UNIT/ML IJ SOLN: 20000.0000 [IU] | INTRAMUSCULAR | Status: DC

## 2011-02-16 MED ORDER — EPOETIN ALFA 20000 UNIT/ML IJ SOLN
INTRAMUSCULAR | Status: AC
  Administered 2011-02-16: 20000 [IU] via SUBCUTANEOUS
  Filled 2011-02-16: qty 1

## 2011-03-02 ENCOUNTER — Encounter (HOSPITAL_COMMUNITY): Payer: Medicare Other

## 2011-03-03 ENCOUNTER — Encounter: Payer: Self-pay | Admitting: *Deleted

## 2011-03-03 ENCOUNTER — Encounter: Payer: Medicare Other | Attending: Family Medicine | Admitting: *Deleted

## 2011-03-03 DIAGNOSIS — Z713 Dietary counseling and surveillance: Secondary | ICD-10-CM | POA: Insufficient documentation

## 2011-03-03 DIAGNOSIS — E119 Type 2 diabetes mellitus without complications: Secondary | ICD-10-CM | POA: Insufficient documentation

## 2011-03-03 NOTE — Patient Instructions (Signed)
Goals:  Follow Diabetes Meal Plan as instructed  Limit carbohydrate intake to 45 grams carbohydrate/meal  Limit carbohydrate intake to 15-20 grams carbohydrate/snack  Replace extra fruit serving with cheese, cottage cheese, or nuts  Eat 3 meals and 2 snacks, every 3-5 hrs  Avoid skipping meals  Add lean protein foods to [employer] meat, cheese, eggs, nuts  Monitor glucose levels as instructed by your doctor  Remember to bring log book of blood sugars at next visit  Aim for 30 mins of physical activity (walking) daily  Keep a food log for next visit with  Foods eaten  Serving size  Time of meal

## 2011-03-03 NOTE — Progress Notes (Signed)
  Medical Nutrition Therapy: Appt start time: 1630 end time: 1730.   Assessment: Primary concerns today: Type 2 Diabetes Management. [name] reports that her physicians are really concerned with her diabetes. Her A1c is >14 and her glucose levels range from 275-500's. She states that she saw a kidney specialist in [place] and states that she got a good report from them. When asked about carbohydrate counting, she notes that she does know how to read labels and stay within her carbohydrate ranges however I do not believe she knows how to count carbs without a label? She does try to limit carb portions however portion sizes are not measured. She does take her medications regularly however is adjusting her own medications to what she feels is correct. Pt states that her physicians want her to "adjust my own insulin based on how I feel" and needs assistance on this. Pt is extremely fearful of becoming "hypoglycemia" which for her is reported to be between 70-100's.   *Will review carbohydrate counting with client today and consult Dr. Barbra Sarks as well as another [facility] CDE for assistance with insulin adjustments  [name] does report that she is motivated to take care of herself.   MEDICATIONS: See medication list. Pt on Novolog and Lantus for diabetes. She reports 100% compliance with her insulin. The only dose variations she reports are when she sleeps in late.   DIETARY INTAKE:  Usual eating pattern includes 2-3 meals and 0-2 snacks per day. [name] reports that despite the need to carbohydrate count, she is on a budget and is going to eat what she can afford which may include pasta, potatoes, rice, and frozen dinners.  24-hr recall:  B (8 AM): 1 sausage patty from [employer] (without biscuit) (unsweetened tea), 6-8 oz OJ  Snk (9-10 AM): 20 grapes  L (12-3 PM): SKIPS lunch OR 1 1/2 cup cornflakes w/ Splenda, skim milk  Snk ( PM): cashews (10) and 2 oranges, 5-6 jolly ranchers (candy) D ( PM): Banquet  Meals (frozen) OR Spaghetti w/ Ragu meat sauce (2 cups) OR Ramen noodles (1 package)  OR 2 Frozen eggroll Snk ( PM): 1 apple OR several handfuls of grapes OR 6-8 oz OJ  *Pt reports that she gets a scoop of [employer] ice cream 3-5 times/week, which she knows shoots her glucose up.  Usual physical activity: Walking around the street  Lab Results  Component Value Date   HGBA1C >14.0 01/27/2011    CBG monitoring: Checking 2-3 times per day  Avg CBG: FBS- 180-275, Postprandial- 250-400, Bedtime- 300+   Estimated energy needs:  1600 calories  180 g carbohydrates  70-80 g protein  50-55 g fat   Progress Towards Goal(s): In progress.   Nutritional Diagnosis:  NI-5.8.2 Excessive carbohydrate intake As related to large portion sizes. As evidenced by estimated carbohydrate intake >250-300 per day (140% estimated needs).  Intervention: Nutrition education.   Handouts given during visit include:  Review of Carbohydrate Counting handout  Review of HgbA1c Handout  Monitoring/Evaluation: Dietary intake, exercise, blood glucose levels, and body weight prn.

## 2011-03-04 ENCOUNTER — Encounter (HOSPITAL_COMMUNITY)
Admission: RE | Admit: 2011-03-04 | Discharge: 2011-03-04 | Disposition: A | Discharge status: Home / Self Care | Payer: Medicare Other | Source: Ambulatory Visit | Attending: Nephrology | Admitting: Nephrology

## 2011-03-04 MED ORDER — EPOETIN ALFA 20000 UNIT/ML IJ SOLN
INTRAMUSCULAR | Status: AC
  Administered 2011-03-04: 20000 [IU] via SUBCUTANEOUS
  Filled 2011-03-04: qty 1

## 2011-03-04 MED ORDER — EPOETIN ALFA 10000 UNIT/ML IJ SOLN: 20000.0000 [IU] | INTRAMUSCULAR | Status: DC

## 2011-03-10 ENCOUNTER — Ambulatory Visit (INDEPENDENT_AMBULATORY_CARE_PROVIDER_SITE_OTHER): Payer: Medicare Other | Admitting: Family Medicine

## 2011-03-10 ENCOUNTER — Encounter: Payer: Self-pay | Admitting: Family Medicine

## 2011-03-10 VITALS — BP 123/81 | HR 103 | Temp 98.0°F | Ht 65.5 in | Wt 191.0 lb

## 2011-03-10 DIAGNOSIS — IMO0001 Reserved for inherently not codable concepts without codable children: Secondary | ICD-10-CM

## 2011-03-10 DIAGNOSIS — E1165 Type 2 diabetes mellitus with hyperglycemia: Secondary | ICD-10-CM

## 2011-03-10 DIAGNOSIS — N182 Chronic kidney disease, stage 2 (mild): Secondary | ICD-10-CM

## 2011-03-10 DIAGNOSIS — IMO0002 Reserved for concepts with insufficient information to code with codable children: Secondary | ICD-10-CM

## 2011-03-10 DIAGNOSIS — Z940 Kidney transplant status: Secondary | ICD-10-CM

## 2011-03-10 MED ORDER — INSULIN GLARGINE 100 UNIT/ML ~~LOC~~ SOLN: 100.0000 [IU] | SUBCUTANEOUS | Status: DC

## 2011-03-10 MED ORDER — SIMVASTATIN 20 MG PO TABS: 20.0000 mg | ORAL_TABLET | Freq: Every day | ORAL | Status: DC

## 2011-03-10 MED ORDER — SULFAMETHOXAZOLE-TRIMETHOPRIM 400-80 MG PO TABS: 1.0000 | ORAL_TABLET | Freq: Every day | ORAL | Status: DC

## 2011-03-10 MED ORDER — INSULIN ASPART 100 UNIT/ML ~~LOC~~ SOLN: 20.0000 [IU] | Freq: Three times a day (TID) | SUBCUTANEOUS | Status: DC

## 2011-03-10 NOTE — Patient Instructions (Signed)
It was good to see you.  I have sent your prescriptions to your pharmacy.  Your blood sugars look better, please keep working on them.  Remember your fasting blood sugar should be 80-120 first thing in the morning.  Please make an appointment to see me in about 2 months, we will check an A1c then.  Please call the office with any problems or questions before then.

## 2011-03-11 NOTE — Assessment & Plan Note (Signed)
Doing well with transplant, although she will need to continue prednisone which complicates DM control.

## 2011-03-11 NOTE — Assessment & Plan Note (Signed)
Improved with increase in novolog and dietary changes.  I advised that she take her lantus at the same time each day- advised it is OK to take it mid-day but important that it be the same time daily.  I also advised that it might be worthwhile going to see a diabetic educator about learning how to do a SSI.  Patient to follow up in 2 months when her next A1C is due.

## 2011-03-11 NOTE — Assessment & Plan Note (Signed)
Creatinine has been stable, patient seems to be more motivated to get DM under control after recent Visit to nephrology.

## 2011-03-11 NOTE — Progress Notes (Signed)
  Subjective:    Patient ID: [name], female    DOB: 1985/05/24, 25 y.o.   MRN: [id number]  HPI  [name] comes in for follow up of her DM II.  She has brought her mom today.  She needs refills on her MD medications since she has increased her insulin regimen.  She reports she is taking 100 units of lantus a day.  She says that sometimes she takes it early in the morning, but sometimes she does not take it until lunch.  She says she is taking 20 units of novolog every time she eats a meal.  She also says that she stopped eating Pasta when she saw Dr. Lorrene Reid, her kidney doctor, and this had made a big difference. She has brought her blood sugar logs, and has had improved control.  She has 2 fasting sugars 80-90's, and reports feeling hypoglycemic at that blood sugar.  However, most of her fasting blood sugars range from 100-200, much improved from last visit.  She says that her nutritionist wants to send her to someone else to teach her to adjust her insulin.  [name] is not sure she needs to go because she says she already adjusts her insulin.  Upon questioning, she cannot fully explain how she adjusts her insulin, but says that if her blood sugar is low she does not take her lantus then, she takes it later.   [name] has seen both her nephrologist and her transplant doctor since her last visit with me.  She is still taking prednisone per transplant, but she did get a good report as far as how the transplant kidney is doing and her kidney function.    Review of Systems Pertinent items noted in HPI.     Objective:   Physical Exam BP 123/81  Pulse 103  Temp(Src) 98 F (36.7 C) (Oral)  Ht 5' 5.5" (1.664 m)  Wt 191 lb (86.637 kg)  BMI 31.30 kg/m2 General appearance: alert, cooperative and no distress Neck: no adenopathy, no JVD, supple, symmetrical, trachea midline and thyroid not enlarged, symmetric, no tenderness/mass/nodules Lungs: clear to auscultation bilaterally Heart: regular rate and  rhythm, S1, S2 normal, no murmur, click, rub or gallop Abdomen: soft, non-tender; bowel sounds normal; no masses,  no organomegaly Extremities: extremities normal, atraumatic, no cyanosis or edema Pulses: 2+ and symmetric Skin: Skin color, texture, turgor normal. No rashes or lesions Neurologic: Grossly normal       Assessment & Plan:

## 2011-03-18 ENCOUNTER — Encounter (HOSPITAL_COMMUNITY): Payer: Medicare Other

## 2011-03-19 ENCOUNTER — Ambulatory Visit: Payer: Medicare Other | Admitting: Family Medicine

## 2011-03-22 ENCOUNTER — Other Ambulatory Visit (HOSPITAL_COMMUNITY): Payer: Self-pay | Admitting: *Deleted

## 2011-03-22 ENCOUNTER — Encounter (HOSPITAL_COMMUNITY)
Admission: RE | Admit: 2011-03-22 | Discharge: 2011-03-22 | Disposition: A | Discharge status: Home / Self Care | Payer: Medicare Other | Source: Ambulatory Visit | Attending: Nephrology | Admitting: Nephrology

## 2011-03-22 DIAGNOSIS — D638 Anemia in other chronic diseases classified elsewhere: Secondary | ICD-10-CM | POA: Diagnosis not present

## 2011-03-22 DIAGNOSIS — N183 Chronic kidney disease, stage 3 unspecified: Secondary | ICD-10-CM | POA: Diagnosis not present

## 2011-03-22 LAB — POCT HEMOGLOBIN-HEMACUE: Hemoglobin: 10.5 g/dL — ABNORMAL LOW (ref 12.0–15.0)

## 2011-03-22 MED ORDER — EPOETIN ALFA 10000 UNIT/ML IJ SOLN: 20000.0000 [IU] | INTRAMUSCULAR | Status: DC

## 2011-03-22 MED ORDER — EPOETIN ALFA 10000 UNIT/ML IJ SOLN
INTRAMUSCULAR | Status: AC
  Filled 2011-03-22: qty 1

## 2011-03-22 MED ORDER — EPOETIN ALFA 20000 UNIT/ML IJ SOLN
INTRAMUSCULAR | Status: AC
  Administered 2011-03-22: 20000 [IU]
  Filled 2011-03-22: qty 1

## 2011-03-22 NOTE — Progress Notes (Signed)
Pt refused 25,000 units of Epogen, states it has always been 20,000 units.  Showed patient new order for increased dose, states office did not notify her, therefore only wants 20,000 units.  Scheduled to return in 2 weeks

## 2011-03-23 ENCOUNTER — Ambulatory Visit: Payer: Medicare Other | Admitting: Family Medicine

## 2011-03-25 ENCOUNTER — Encounter: Payer: Medicare Other | Attending: Family Medicine | Admitting: Dietician

## 2011-03-25 ENCOUNTER — Encounter: Payer: Self-pay | Admitting: Dietician

## 2011-03-25 DIAGNOSIS — Z713 Dietary counseling and surveillance: Secondary | ICD-10-CM | POA: Insufficient documentation

## 2011-03-25 DIAGNOSIS — E119 Type 2 diabetes mellitus without complications: Secondary | ICD-10-CM | POA: Insufficient documentation

## 2011-03-25 DIAGNOSIS — IMO0002 Reserved for concepts with insufficient information to code with codable children: Secondary | ICD-10-CM

## 2011-03-25 DIAGNOSIS — E1165 Type 2 diabetes mellitus with hyperglycemia: Secondary | ICD-10-CM

## 2011-03-25 NOTE — Patient Instructions (Signed)
   Keep a food diary, recording all the carbs, protein and fat for 3 days  Use the yellow card to determine carbs, protein and fat.  You need to count the number of grapes, crackers chips.  Use any available food label for carb amount, bring some to your next appointment.  You need to measure the serving size of the potatoes, corn, pasta, rice, pinto beans, sweet potatoes,   Each day that it is not raining, you need to walk for 30 minutes. As many as 7 days per week.  Go to Google and go the web site www.calorieking.com  To find carb content.

## 2011-03-29 ENCOUNTER — Encounter (HOSPITAL_COMMUNITY): Payer: Medicare Other

## 2011-03-30 ENCOUNTER — Encounter (HOSPITAL_COMMUNITY)
Admission: RE | Admit: 2011-03-30 | Discharge: 2011-03-30 | Disposition: A | Discharge status: Home / Self Care | Payer: Medicare Other | Source: Ambulatory Visit | Attending: Nephrology | Admitting: Nephrology

## 2011-03-30 DIAGNOSIS — D638 Anemia in other chronic diseases classified elsewhere: Secondary | ICD-10-CM | POA: Diagnosis not present

## 2011-03-30 DIAGNOSIS — N183 Chronic kidney disease, stage 3 unspecified: Secondary | ICD-10-CM | POA: Diagnosis not present

## 2011-03-30 LAB — PROTEIN / CREATININE RATIO, URINE: Creatinine, Urine: 39.55 mg/dL

## 2011-03-30 LAB — DIFFERENTIAL
Eosinophils Relative: 1 % (ref 0–5)
Lymphocytes Relative: 18 % (ref 12–46)
Lymphs Abs: 2.2 10*3/uL (ref 0.7–4.0)
Monocytes Absolute: 0.6 10*3/uL (ref 0.1–1.0)
Monocytes Relative: 5 % (ref 3–12)
Neutro Abs: 9.2 10*3/uL — ABNORMAL HIGH (ref 1.7–7.7)

## 2011-03-30 LAB — URINALYSIS, ROUTINE W REFLEX MICROSCOPIC
Glucose, UA: >1000 mg/dL — AB
Hgb urine dipstick: NEGATIVE
Leukocytes, UA: NEGATIVE
Protein, ur: NEGATIVE mg/dL
Specific Gravity, Urine: 1.021 (ref 1.005–1.030)
pH: 5.5 (ref 5.0–8.0)

## 2011-03-30 LAB — RENAL FUNCTION PANEL
CO2: 22 mEq/L (ref 19–32)
Chloride: 97 mEq/L (ref 96–112)
Creatinine, Ser: 1.69 mg/dL — ABNORMAL HIGH (ref 0.50–1.10)
GFR calc non Af Amer: 41 mL/min — ABNORMAL LOW (ref >90)
Potassium: 4.5 mEq/L (ref 3.5–5.1)

## 2011-03-30 LAB — HEPATIC FUNCTION PANEL
Albumin: 4.4 g/dL (ref 3.5–5.2)
Alkaline Phosphatase: 76 U/L (ref 39–117)
Total Protein: 8 g/dL (ref 6.0–8.3)

## 2011-03-30 LAB — IRON AND TIBC
Iron: 49 ug/dL (ref 42–135)
Saturation Ratios: 17 % — ABNORMAL LOW (ref 20–55)
TIBC: 295 ug/dL (ref 250–470)

## 2011-03-30 LAB — CBC
HCT: 33.4 % — ABNORMAL LOW (ref 36.0–46.0)
Hemoglobin: 10.9 g/dL — ABNORMAL LOW (ref 12.0–15.0)
MCV: 89.1 fL (ref 78.0–100.0)
RBC: 3.75 MIL/uL — ABNORMAL LOW (ref 3.87–5.11)
WBC: 12.1 10*3/uL — ABNORMAL HIGH (ref 4.0–10.5)

## 2011-03-30 LAB — URINE MICROSCOPIC-ADD ON

## 2011-03-30 MED ORDER — EPOETIN ALFA 40000 UNIT/ML IJ SOLN: 25000.0000 [IU] | INTRAMUSCULAR | Status: DC

## 2011-03-30 MED ORDER — EPOETIN ALFA 10000 UNIT/ML IJ SOLN
INTRAMUSCULAR | Status: AC
  Administered 2011-03-30: 5000 [IU] via SUBCUTANEOUS
  Filled 2011-03-30: qty 1

## 2011-03-30 MED FILL — Epoetin Alfa Inj 10000 Unit/ML: INTRAMUSCULAR | Qty: 1 | Status: AC

## 2011-04-06 ENCOUNTER — Encounter (HOSPITAL_COMMUNITY)
Admission: RE | Admit: 2011-04-06 | Discharge: 2011-04-06 | Disposition: A | Discharge status: Home / Self Care | Payer: Medicare Other | Source: Ambulatory Visit | Attending: Nephrology | Admitting: Nephrology

## 2011-04-06 DIAGNOSIS — N183 Chronic kidney disease, stage 3 unspecified: Secondary | ICD-10-CM | POA: Diagnosis not present

## 2011-04-06 DIAGNOSIS — D638 Anemia in other chronic diseases classified elsewhere: Secondary | ICD-10-CM | POA: Diagnosis not present

## 2011-04-06 LAB — POCT HEMOGLOBIN-HEMACUE: Hemoglobin: 10.1 g/dL — ABNORMAL LOW (ref 12.0–15.0)

## 2011-04-06 MED ORDER — EPOETIN ALFA 40000 UNIT/ML IJ SOLN: 25000.0000 [IU] | INTRAMUSCULAR | Status: DC

## 2011-04-06 MED ORDER — EPOETIN ALFA 10000 UNIT/ML IJ SOLN
INTRAMUSCULAR | Status: AC
  Administered 2011-04-06: 25000 [IU] via SUBCUTANEOUS
  Filled 2011-04-06: qty 1

## 2011-04-06 MED ORDER — EPOETIN ALFA 20000 UNIT/ML IJ SOLN
INTRAMUSCULAR | Status: AC
  Filled 2011-04-06: qty 1

## 2011-04-07 ENCOUNTER — Encounter: Payer: Self-pay | Admitting: Dietician

## 2011-04-07 NOTE — Progress Notes (Signed)
  Medical Nutrition Therapy:  Appt start time: [id number] end time:  1245  Assessment:  Primary concerns today: [name] comes in today for an appointment for blood glucose management. She is currently on a regiment of Lantus 100 units in the AM an Novolog 20 units before each meal.  Her blood glucose levels are elevated but during the day, she will come to more normal levels and if she skips meals and does not take the Novolog, she may go low.  The elevated blood glucose levels look to correlate with her dosages of rejection drugs.  Her HgA1C on 01/27/11 was >14%.  She expresses a desire to get control of her blood glucose and to feel better.  She feels that she might benefit by using an insulin pump.  In our assessment today (I met with [name] along with Jetty Peeks, RD, CDE)  We feel that she would technically benefit with the use of an insulin pump.  The question we have is her ability to carry out the process of filling the pump reservoir using sterile technique, priming the pump and then properly inserting the canula.  A basal rate set to her medication schedule and a set meal bolus might work for her.  We want to further evaluate her problem solving skills and dexterity.  We ask that she return for a follow-up visit when we can show her a pump and have her play with and practice some to the needed skills.  MEDICATIONS: For diabetes: lantus 100 units each AM.  Novolog 20 units before each meal. For Transplant: Prednisone 5 mg daily, Prograft 1 mg (2 caps in AM and PM)  DIETARY INTAKE:  24-hr recall:Her recall is vague for the last 2 hours.    B (8:30 AM):  Drank water, had 15 grapes and took her Lantus  At 12:15 PM her blood glucose levels was 89 mg/dl.    At this point, she was vague or not willing to recall the previous evening and afternoon.    Recent physical activity: No regular activity.  C/O being tired much of the time.  Estimated energy needs:HT: 66 in  WT: 193.2 lb (87.4 Kg)  BMI: 31.2%   Adjusted Weight:150 lb (68 kg) 1300-1400 calories 150-155 g carbohydrates 100-105 g protein 35-38 g fat  Progress Towards Goal(s):  In progress.   Nutritional Diagnosis:  [place]-2.1 Inpaired nutrition utilization As related to glucose, transplant rejection drugs, inappropiate carb intake.  As evidenced by elevated blood glucose levels and HgA1C at >14%.    Intervention:  Nutrition Have ask her to keep a food and glucose diary and return for follow-up so that we can get a handle on how best to help help her deal with the glucose excursions.  Handouts given during visit include:  Novo Nordisk Carb Counting Guide  Yellow Card with Nutrient serving sizes  Food diary sheets  Monitoring/Evaluation:  Dietary intake, exercise, blood glucose levels, and body weight in 4-6 weeks.  To call for follow-up.

## 2011-04-14 ENCOUNTER — Telehealth: Payer: Self-pay | Admitting: Family Medicine

## 2011-04-14 MED ORDER — ACCU-CHEK FASTCLIX LANCETS MISC: 1.0000 | Freq: Three times a day (TID) | Status: DC

## 2011-04-14 NOTE — Telephone Encounter (Signed)
Was told by maggie may - DM teaching class that she needed to get Fast Click lancets that is used with [employer] meter.  She doesn't want the generic because it hurts more. Bryant

## 2011-04-14 NOTE — Telephone Encounter (Signed)
Rx sent 

## 2011-04-14 NOTE — Telephone Encounter (Signed)
Will forward to PCP 

## 2011-04-19 ENCOUNTER — Other Ambulatory Visit (HOSPITAL_COMMUNITY): Payer: Self-pay | Admitting: *Deleted

## 2011-04-19 DIAGNOSIS — E119 Type 2 diabetes mellitus without complications: Secondary | ICD-10-CM | POA: Diagnosis not present

## 2011-04-19 DIAGNOSIS — I10 Essential (primary) hypertension: Secondary | ICD-10-CM | POA: Diagnosis not present

## 2011-04-19 DIAGNOSIS — Z940 Kidney transplant status: Secondary | ICD-10-CM | POA: Diagnosis not present

## 2011-04-19 DIAGNOSIS — D649 Anemia, unspecified: Secondary | ICD-10-CM | POA: Diagnosis not present

## 2011-04-20 ENCOUNTER — Encounter: Payer: Self-pay | Admitting: Family Medicine

## 2011-04-20 ENCOUNTER — Encounter (HOSPITAL_COMMUNITY)
Admission: RE | Admit: 2011-04-20 | Discharge: 2011-04-20 | Disposition: A | Discharge status: Home / Self Care | Payer: Medicare Other | Source: Ambulatory Visit | Attending: Nephrology | Admitting: Nephrology

## 2011-04-20 ENCOUNTER — Ambulatory Visit (INDEPENDENT_AMBULATORY_CARE_PROVIDER_SITE_OTHER): Payer: Medicare Other | Admitting: Family Medicine

## 2011-04-20 VITALS — BP 121/80 | HR 101 | Temp 98.4°F | Ht 66.0 in | Wt 193.0 lb

## 2011-04-20 DIAGNOSIS — I10 Essential (primary) hypertension: Secondary | ICD-10-CM

## 2011-04-20 DIAGNOSIS — E669 Obesity, unspecified: Secondary | ICD-10-CM | POA: Diagnosis not present

## 2011-04-20 DIAGNOSIS — IMO0001 Reserved for inherently not codable concepts without codable children: Secondary | ICD-10-CM | POA: Diagnosis not present

## 2011-04-20 DIAGNOSIS — E1165 Type 2 diabetes mellitus with hyperglycemia: Secondary | ICD-10-CM

## 2011-04-20 DIAGNOSIS — D638 Anemia in other chronic diseases classified elsewhere: Secondary | ICD-10-CM | POA: Diagnosis not present

## 2011-04-20 DIAGNOSIS — N183 Chronic kidney disease, stage 3 unspecified: Secondary | ICD-10-CM | POA: Diagnosis not present

## 2011-04-20 DIAGNOSIS — T24339A Burn of third degree of unspecified lower leg, initial encounter: Secondary | ICD-10-CM | POA: Diagnosis not present

## 2011-04-20 DIAGNOSIS — IMO0002 Reserved for concepts with insufficient information to code with codable children: Secondary | ICD-10-CM

## 2011-04-20 MED ORDER — EPOETIN ALFA 20000 UNIT/ML IJ SOLN
INTRAMUSCULAR | Status: AC
  Administered 2011-04-20: 25000 [IU] via SUBCUTANEOUS
  Filled 2011-04-20: qty 1

## 2011-04-20 MED ORDER — EPOETIN ALFA 40000 UNIT/ML IJ SOLN: 25000.0000 [IU] | INTRAMUSCULAR | Status: DC

## 2011-04-20 MED ORDER — EPOETIN ALFA 10000 UNIT/ML IJ SOLN
INTRAMUSCULAR | Status: AC
  Filled 2011-04-20: qty 1

## 2011-04-20 NOTE — Patient Instructions (Signed)
It was good to see you today.  Your Hemoglobin A1C is  Lab Results  Component Value Date   HGBA1C 12.2 04/20/2011  .  Remember your goal for A1C is less than 7.  Your goal for fasting morning blood sugar is 80-120.   Your blood pressure today was BP: 121/80 mmHg.  Remember your goal blood pressure is about 120/80.  Please be sure to take your medication every day.  Great Job!  I want you to increase your Lantus gradually.  Tomorrow morning, take 105 units of Lantus.  When you check your blood sugar the day after tomorrow first thing in the morning, if it is above 150, then increase your Lantus dose to 110 units.  I want you to continue to increase the Lantus by 5 units every day until your fasting blood sugar is below 150.  When your blood sugar is below 150 first thing in the morning, take the same dose you took the day before.    Please make an appointment to see me again in about 3 months.

## 2011-04-20 NOTE — Assessment & Plan Note (Signed)
This is healed but now hyperpigmented.  Advised that this happens sometimes in people with dark skin after healing.  Advised trying topical vitamin E or Mederma.  Monitor closely for signs of unhealing ulcers.

## 2011-04-20 NOTE — Assessment & Plan Note (Signed)
No improvement, but pt has not gained weight.  I again encouraged her to exercise, to explore different exercises and find something she likes to do.

## 2011-04-20 NOTE — Assessment & Plan Note (Signed)
At goal, continue current medications

## 2011-04-20 NOTE — Progress Notes (Signed)
  Subjective:    Patient ID: [name], female    DOB: 1985-04-04, 26 y.o.   MRN: [id number]  HPI  Bethzy comes in for follow up.   DM- pt seeing Jetty Peeks, DM nutritionist.  She has made some changes in her diet (changed to wheat pasta and cut out eating gummy bears).  She is trying to eat more vegetables, but has difficulty affording healthy foods.  She is taking 100 units of lantus in the morning, and 20 units of novolog with each meal.  Her fasting sugars range from 99-350 in the log she has brought today, but most are above 200.    Obesity- Pt has not been exercising much.  She says she just moved and has been very busy.  She is glad she has not gained any weight since her last visit.   HTN- taking medications, no side effects, no chest pain, palpitations, dyspnea.    Burn on leg- Izabelle says this is healed over but the skin around it has gotten very dark.  It does not hurt or itch, has not been open, drained or bled.    Review of Systems Pertinent Items in HPI.     Objective:   Physical Exam BP 121/80  Pulse 101  Temp(Src) 98.4 F (36.9 C) (Oral)  Ht 5\' 6"  (1.676 m)  Wt 193 lb (87.544 kg)  BMI 31.15 kg/m2 General appearance: alert, cooperative and no distress Neck: no adenopathy, supple, symmetrical, trachea midline and thyroid not enlarged, symmetric, no tenderness/mass/nodules Lungs: clear to auscultation bilaterally Heart: regular rate and rhythm, S1, S2 normal, no murmur, click, rub or gallop Abdomen: soft, non-tender; bowel sounds normal; no masses,  no organomegaly Extremities: extremities normal, atraumatic, no cyanosis or edema Pulses: 2+ and symmetric Skin: There is a 5 x 2 cm healed burn with hyperpigmentation around a hypopigmented area.  Wound is closed, no surrounding erythema or warmth.  Neurologic: Grossly normal       Assessment & Plan:

## 2011-04-20 NOTE — Assessment & Plan Note (Signed)
A1C 12.2, down from >14.  Modest improvement.  Pt is seeing a diabetes nutritionist and is planning to see a diabetes educator.  Spent great length of time today discussing food choices, healthy foods she can afford, decreasing sweet snacks, etc.  Will have her increase lantus 5 units per day until fasting sugar below 150 (see pt instructions).

## 2011-04-22 LAB — TACROLIMUS LEVEL: Tacrolimus (FK506) - LabCorp: 5 ng/mL

## 2011-04-23 MED FILL — Epoetin Alfa Inj 10000 Unit/ML: INTRAMUSCULAR | Qty: 1 | Status: AC

## 2011-05-04 ENCOUNTER — Encounter (HOSPITAL_COMMUNITY)
Admission: RE | Admit: 2011-05-04 | Discharge: 2011-05-04 | Disposition: A | Discharge status: Home / Self Care | Payer: Medicare Other | Source: Ambulatory Visit | Attending: Nephrology | Admitting: Nephrology

## 2011-05-04 DIAGNOSIS — D638 Anemia in other chronic diseases classified elsewhere: Secondary | ICD-10-CM | POA: Diagnosis not present

## 2011-05-04 DIAGNOSIS — N183 Chronic kidney disease, stage 3 unspecified: Secondary | ICD-10-CM | POA: Diagnosis not present

## 2011-05-04 LAB — URINALYSIS, ROUTINE W REFLEX MICROSCOPIC
Leukocytes, UA: NEGATIVE
Nitrite: NEGATIVE
Specific Gravity, Urine: 1.02 (ref 1.005–1.030)
pH: 5.5 (ref 5.0–8.0)

## 2011-05-04 LAB — DIFFERENTIAL
Lymphs Abs: 1.7 10*3/uL (ref 0.7–4.0)
Monocytes Relative: 5 % (ref 3–12)
Neutro Abs: 5.1 10*3/uL (ref 1.7–7.7)
Neutrophils Relative %: 69 % (ref 43–77)

## 2011-05-04 LAB — RENAL FUNCTION PANEL
BUN: 34 mg/dL — ABNORMAL HIGH (ref 6–23)
Calcium: 9.7 mg/dL (ref 8.4–10.5)
Creatinine, Ser: 1.47 mg/dL — ABNORMAL HIGH (ref 0.50–1.10)
Glucose, Bld: 298 mg/dL — ABNORMAL HIGH (ref 70–99)
Phosphorus: 3.3 mg/dL (ref 2.3–4.6)
Sodium: 135 mEq/L (ref 135–145)

## 2011-05-04 LAB — FERRITIN: Ferritin: 859 ng/mL — ABNORMAL HIGH (ref 10–291)

## 2011-05-04 LAB — URINE MICROSCOPIC-ADD ON

## 2011-05-04 LAB — PROTEIN / CREATININE RATIO, URINE: Protein Creatinine Ratio: 0.07 (ref 0.00–0.15)

## 2011-05-04 LAB — IRON AND TIBC
Saturation Ratios: 18 % — ABNORMAL LOW (ref 20–55)
UIBC: 250 ug/dL (ref 125–400)

## 2011-05-04 LAB — CBC
Hemoglobin: 10.7 g/dL — ABNORMAL LOW (ref 12.0–15.0)
Platelets: 200 10*3/uL (ref 150–400)
RBC: 3.65 MIL/uL — ABNORMAL LOW (ref 3.87–5.11)
WBC: 7.4 10*3/uL (ref 4.0–10.5)

## 2011-05-04 MED ORDER — EPOETIN ALFA 40000 UNIT/ML IJ SOLN: 25000.0000 [IU] | INTRAMUSCULAR | Status: DC

## 2011-05-04 MED ORDER — EPOETIN ALFA 20000 UNIT/ML IJ SOLN
INTRAMUSCULAR | Status: AC
  Administered 2011-05-04: 25000 [IU] via SUBCUTANEOUS
  Filled 2011-05-04: qty 1

## 2011-05-07 DIAGNOSIS — Z940 Kidney transplant status: Secondary | ICD-10-CM | POA: Diagnosis not present

## 2011-05-10 DIAGNOSIS — R7989 Other specified abnormal findings of blood chemistry: Secondary | ICD-10-CM | POA: Diagnosis not present

## 2011-05-10 DIAGNOSIS — I10 Essential (primary) hypertension: Secondary | ICD-10-CM | POA: Diagnosis not present

## 2011-05-10 DIAGNOSIS — Z940 Kidney transplant status: Secondary | ICD-10-CM | POA: Diagnosis not present

## 2011-05-10 DIAGNOSIS — D8989 Other specified disorders involving the immune mechanism, not elsewhere classified: Secondary | ICD-10-CM | POA: Diagnosis not present

## 2011-05-10 DIAGNOSIS — Z79899 Other long term (current) drug therapy: Secondary | ICD-10-CM | POA: Diagnosis not present

## 2011-05-18 ENCOUNTER — Encounter (HOSPITAL_COMMUNITY)
Admission: RE | Admit: 2011-05-18 | Discharge: 2011-05-18 | Disposition: A | Discharge status: Home / Self Care | Payer: Medicare Other | Source: Ambulatory Visit | Attending: Nephrology | Admitting: Nephrology

## 2011-05-18 DIAGNOSIS — D638 Anemia in other chronic diseases classified elsewhere: Secondary | ICD-10-CM | POA: Diagnosis not present

## 2011-05-18 DIAGNOSIS — N183 Chronic kidney disease, stage 3 unspecified: Secondary | ICD-10-CM | POA: Diagnosis not present

## 2011-05-18 MED ORDER — EPOETIN ALFA 10000 UNIT/ML IJ SOLN
INTRAMUSCULAR | Status: AC
  Administered 2011-05-18: 5000 [IU] via SUBCUTANEOUS
  Filled 2011-05-18: qty 1

## 2011-05-18 MED ORDER — EPOETIN ALFA 40000 UNIT/ML IJ SOLN: 25000.0000 [IU] | INTRAMUSCULAR | Status: DC

## 2011-05-18 MED ORDER — EPOETIN ALFA 20000 UNIT/ML IJ SOLN
INTRAMUSCULAR | Status: AC
  Administered 2011-05-18: 20000 [IU] via SUBCUTANEOUS
  Filled 2011-05-18: qty 1

## 2011-05-20 LAB — TACROLIMUS LEVEL: Tacrolimus (FK506) - LabCorp: 6.3 ng/mL

## 2011-06-01 ENCOUNTER — Encounter (HOSPITAL_COMMUNITY): Admission: RE | Admit: 2011-06-01 | Payer: Medicare Other | Source: Ambulatory Visit

## 2011-06-04 ENCOUNTER — Encounter (HOSPITAL_COMMUNITY)
Admission: RE | Admit: 2011-06-04 | Discharge: 2011-06-04 | Disposition: A | Discharge status: Home / Self Care | Payer: Medicare Other | Source: Ambulatory Visit | Attending: Nephrology | Admitting: Nephrology

## 2011-06-04 DIAGNOSIS — D638 Anemia in other chronic diseases classified elsewhere: Secondary | ICD-10-CM | POA: Diagnosis not present

## 2011-06-04 DIAGNOSIS — N183 Chronic kidney disease, stage 3 unspecified: Secondary | ICD-10-CM | POA: Diagnosis not present

## 2011-06-04 LAB — RENAL FUNCTION PANEL
Albumin: 4.1 g/dL (ref 3.5–5.2)
BUN: 45 mg/dL — ABNORMAL HIGH (ref 6–23)
CO2: 20 meq/L (ref 19–32)
Calcium: 9.7 mg/dL (ref 8.4–10.5)
Chloride: 106 meq/L (ref 96–112)
Creatinine, Ser: 1.46 mg/dL — ABNORMAL HIGH (ref 0.50–1.10)
GFR calc Af Amer: 56 mL/min — ABNORMAL LOW
GFR calc non Af Amer: 48 mL/min — ABNORMAL LOW
Glucose, Bld: 208 mg/dL — ABNORMAL HIGH (ref 70–99)
Phosphorus: 4.3 mg/dL (ref 2.3–4.6)
Potassium: 4.3 meq/L (ref 3.5–5.1)
Sodium: 137 meq/L (ref 135–145)

## 2011-06-04 LAB — IRON AND TIBC
Iron: 93 ug/dL (ref 42–135)
Saturation Ratios: 33 % (ref 20–55)
TIBC: 278 ug/dL (ref 250–470)
UIBC: 185 ug/dL (ref 125–400)

## 2011-06-04 LAB — CBC
Hemoglobin: 10.5 g/dL — ABNORMAL LOW (ref 12.0–15.0)
MCH: 28.9 pg (ref 26.0–34.0)
MCHC: 32.3 g/dL (ref 30.0–36.0)
MCV: 89.5 fL (ref 78.0–100.0)
RBC: 3.63 MIL/uL — ABNORMAL LOW (ref 3.87–5.11)

## 2011-06-04 LAB — DIFFERENTIAL
Basophils Relative: 0 % (ref 0–1)
Eosinophils Absolute: 0.2 10*3/uL (ref 0.0–0.7)
Eosinophils Relative: 2 % (ref 0–5)
Lymphs Abs: 3.3 10*3/uL (ref 0.7–4.0)
Monocytes Relative: 6 % (ref 3–12)

## 2011-06-04 LAB — URINALYSIS, ROUTINE W REFLEX MICROSCOPIC
Bilirubin Urine: NEGATIVE
Glucose, UA: NEGATIVE mg/dL
Hgb urine dipstick: NEGATIVE
Ketones, ur: NEGATIVE mg/dL
Leukocytes, UA: NEGATIVE
Nitrite: NEGATIVE
Protein, ur: NEGATIVE mg/dL
Specific Gravity, Urine: 1.016 (ref 1.005–1.030)
Urobilinogen, UA: 0.2 mg/dL (ref 0.0–1.0)
pH: 5 (ref 5.0–8.0)

## 2011-06-04 LAB — PROTEIN / CREATININE RATIO, URINE: Total Protein, Urine: <4 mg/dL

## 2011-06-04 LAB — FERRITIN: Ferritin: 889 ng/mL — ABNORMAL HIGH (ref 10–291)

## 2011-06-04 MED ORDER — EPOETIN ALFA 20000 UNIT/ML IJ SOLN
INTRAMUSCULAR | Status: AC
  Administered 2011-06-04: 20000 [IU] via SUBCUTANEOUS
  Filled 2011-06-04: qty 1

## 2011-06-04 MED ORDER — EPOETIN ALFA 40000 UNIT/ML IJ SOLN: 25000.0000 [IU] | INTRAMUSCULAR | Status: DC

## 2011-06-04 MED ORDER — EPOETIN ALFA 10000 UNIT/ML IJ SOLN
INTRAMUSCULAR | Status: AC
  Administered 2011-06-04: 5000 [IU] via SUBCUTANEOUS
  Filled 2011-06-04: qty 1

## 2011-06-14 DIAGNOSIS — I10 Essential (primary) hypertension: Secondary | ICD-10-CM | POA: Diagnosis not present

## 2011-06-14 DIAGNOSIS — Z940 Kidney transplant status: Secondary | ICD-10-CM | POA: Diagnosis not present

## 2011-06-14 DIAGNOSIS — E119 Type 2 diabetes mellitus without complications: Secondary | ICD-10-CM | POA: Diagnosis not present

## 2011-06-15 ENCOUNTER — Encounter (HOSPITAL_COMMUNITY)
Admission: RE | Admit: 2011-06-15 | Discharge: 2011-06-15 | Disposition: A | Discharge status: Home / Self Care | Payer: Medicare Other | Source: Ambulatory Visit | Attending: Nephrology | Admitting: Nephrology

## 2011-06-15 DIAGNOSIS — N183 Chronic kidney disease, stage 3 unspecified: Secondary | ICD-10-CM | POA: Diagnosis not present

## 2011-06-15 DIAGNOSIS — D638 Anemia in other chronic diseases classified elsewhere: Secondary | ICD-10-CM | POA: Insufficient documentation

## 2011-06-15 MED ORDER — EPOETIN ALFA 40000 UNIT/ML IJ SOLN
25000.0000 [IU] | INTRAMUSCULAR | Status: DC
Start: 2011-06-15 — End: 2011-06-16

## 2011-06-15 MED ORDER — EPOETIN ALFA 10000 UNIT/ML IJ SOLN
INTRAMUSCULAR | Status: AC
  Administered 2011-06-15: 5000 [IU] via SUBCUTANEOUS
  Filled 2011-06-15: qty 1

## 2011-06-15 MED ORDER — EPOETIN ALFA 20000 UNIT/ML IJ SOLN
INTRAMUSCULAR | Status: AC
  Administered 2011-06-15: 20000 [IU] via SUBCUTANEOUS
  Filled 2011-06-15: qty 1

## 2011-06-18 ENCOUNTER — Other Ambulatory Visit: Payer: Self-pay | Admitting: Family Medicine

## 2011-06-28 ENCOUNTER — Other Ambulatory Visit (HOSPITAL_COMMUNITY): Payer: Self-pay | Admitting: *Deleted

## 2011-06-29 ENCOUNTER — Encounter (HOSPITAL_COMMUNITY)
Admission: RE | Admit: 2011-06-29 | Discharge: 2011-06-29 | Disposition: A | Discharge status: Home / Self Care | Payer: Medicare Other | Source: Ambulatory Visit | Attending: Nephrology | Admitting: Nephrology

## 2011-06-29 LAB — HEPATIC FUNCTION PANEL
ALT: 16 U/L (ref 0–35)
Alkaline Phosphatase: 67 U/L (ref 39–117)
Bilirubin, Direct: <0.1 mg/dL (ref 0.0–0.3)

## 2011-06-29 LAB — URINALYSIS, ROUTINE W REFLEX MICROSCOPIC
Bilirubin Urine: NEGATIVE
Glucose, UA: 250 mg/dL — AB
Hgb urine dipstick: NEGATIVE
Protein, ur: NEGATIVE mg/dL

## 2011-06-29 LAB — CBC
HCT: 32.2 % — ABNORMAL LOW (ref 36.0–46.0)
MCV: 89.9 fL (ref 78.0–100.0)
RDW: 14.1 % (ref 11.5–15.5)
WBC: 8 10*3/uL (ref 4.0–10.5)

## 2011-06-29 LAB — FERRITIN: Ferritin: 802 ng/mL — ABNORMAL HIGH (ref 10–291)

## 2011-06-29 LAB — RENAL FUNCTION PANEL
BUN: 44 mg/dL — ABNORMAL HIGH (ref 6–23)
CO2: 22 mEq/L (ref 19–32)
Chloride: 106 mEq/L (ref 96–112)
Creatinine, Ser: 1.89 mg/dL — ABNORMAL HIGH (ref 0.50–1.10)
GFR calc non Af Amer: 35 mL/min — ABNORMAL LOW (ref >90)
Glucose, Bld: 159 mg/dL — ABNORMAL HIGH (ref 70–99)

## 2011-06-29 LAB — IRON AND TIBC
Saturation Ratios: 23 % (ref 20–55)
TIBC: 261 ug/dL (ref 250–470)
UIBC: 202 ug/dL (ref 125–400)

## 2011-06-29 LAB — DIFFERENTIAL
Basophils Absolute: 0 10*3/uL (ref 0.0–0.1)
Eosinophils Relative: 1 % (ref 0–5)
Lymphocytes Relative: 21 % (ref 12–46)
Lymphs Abs: 1.7 10*3/uL (ref 0.7–4.0)
Monocytes Absolute: 0.3 10*3/uL (ref 0.1–1.0)

## 2011-06-29 LAB — PROTEIN / CREATININE RATIO, URINE: Creatinine, Urine: 57.4 mg/dL

## 2011-06-29 MED ORDER — EPOETIN ALFA 10000 UNIT/ML IJ SOLN
INTRAMUSCULAR | Status: AC
  Administered 2011-06-29: 10000 [IU] via SUBCUTANEOUS
  Filled 2011-06-29: qty 1

## 2011-06-29 MED ORDER — EPOETIN ALFA 40000 UNIT/ML IJ SOLN: 25000.0000 [IU] | INTRAMUSCULAR | Status: DC

## 2011-06-29 MED ORDER — EPOETIN ALFA 20000 UNIT/ML IJ SOLN
INTRAMUSCULAR | Status: AC
  Administered 2011-06-29: 20000 [IU] via SUBCUTANEOUS
  Filled 2011-06-29: qty 1

## 2011-07-08 ENCOUNTER — Other Ambulatory Visit (HOSPITAL_COMMUNITY): Payer: Self-pay | Admitting: *Deleted

## 2011-07-13 ENCOUNTER — Encounter (HOSPITAL_COMMUNITY)
Admission: RE | Admit: 2011-07-13 | Discharge: 2011-07-13 | Disposition: A | Discharge status: Home / Self Care | Payer: Medicare Other | Source: Ambulatory Visit | Attending: Nephrology | Admitting: Nephrology

## 2011-07-13 LAB — RENAL FUNCTION PANEL
BUN: 43 mg/dL — ABNORMAL HIGH (ref 6–23)
Calcium: 9.6 mg/dL (ref 8.4–10.5)
Creatinine, Ser: 1.5 mg/dL — ABNORMAL HIGH (ref 0.50–1.10)
Glucose, Bld: 475 mg/dL — ABNORMAL HIGH (ref 70–99)
Phosphorus: 3.2 mg/dL (ref 2.3–4.6)
Sodium: 133 mEq/L — ABNORMAL LOW (ref 135–145)

## 2011-07-13 MED ORDER — EPOETIN ALFA 20000 UNIT/ML IJ SOLN
INTRAMUSCULAR | Status: AC
  Administered 2011-07-13: 20000 [IU] via SUBCUTANEOUS
  Filled 2011-07-13: qty 1

## 2011-07-13 MED ORDER — EPOETIN ALFA 40000 UNIT/ML IJ SOLN: 25000.0000 [IU] | INTRAMUSCULAR | Status: DC

## 2011-07-13 MED ORDER — EPOETIN ALFA 10000 UNIT/ML IJ SOLN
INTRAMUSCULAR | Status: AC
  Administered 2011-07-13: 5000 [IU] via SUBCUTANEOUS
  Filled 2011-07-13: qty 1

## 2011-07-20 ENCOUNTER — Ambulatory Visit: Payer: Medicare Other | Admitting: Family Medicine

## 2011-07-21 ENCOUNTER — Telehealth: Payer: Self-pay | Admitting: *Deleted

## 2011-07-21 NOTE — Telephone Encounter (Signed)
PA required for Lantus insulin. I called insurance to have form faxed. However patient needs to have tried levemir, novolin or novolog. Will forward to MD.

## 2011-07-22 ENCOUNTER — Telehealth: Payer: Self-pay | Admitting: Family Medicine

## 2011-07-22 MED ORDER — INSULIN DETEMIR 100 UNIT/ML ~~LOC~~ SOLN: 60.0000 [IU] | Freq: Every day | SUBCUTANEOUS | Status: DC

## 2011-07-22 NOTE — Telephone Encounter (Signed)
Mom is asking to speak with Dr Byrd Hesselbach about the meds that she is now supposed to take.  Needs to talk to her before they pick up the medicine.

## 2011-07-22 NOTE — Telephone Encounter (Signed)
Called and spoke with [name] about Levemir change.  [name] it was a brand name medication, and Medicare has changed it to the preferred medication.  I let her know that [name] had to try the Levemir to see if it works and she can tolerate it.  If not, then we could do the prior authorization for Lantus.

## 2011-07-22 NOTE — Telephone Encounter (Signed)
Addended by: Cletus Gash on: 07/22/2011 12:03 PM   Modules accepted: Orders

## 2011-07-22 NOTE — Telephone Encounter (Signed)
Mother calling to request that provider send in letter to insurance company that patient need to continue the Lantus instead of the generic brand.  Patient is doing well with the name brand and mother fears that if given the generic this will cause patient to have more hospital visits than necessary or ? Allergic rx to new med.  Please contact pt or mother if there are any questions reqarding this request.

## 2011-07-22 NOTE — Telephone Encounter (Signed)
Called [name], she has never been on Levemir before.  I told her it was not the preferred medication for her insurance, and it works the same as Lantus.  She will try Levemir, Rx sent to pharmacy.  She has a follow up appointment scheduled on 5/14.

## 2011-07-23 NOTE — Telephone Encounter (Signed)
This is a late entry.  Called and spoke with [name], [place] mother yesterday and discussed that [name] had to try Levemir before I could try a prior auth for Lantus.  Advised it is a brand name and very similar, that I doubt she would have an adverse reaction. Mom voices understanding.

## 2011-07-27 ENCOUNTER — Ambulatory Visit (INDEPENDENT_AMBULATORY_CARE_PROVIDER_SITE_OTHER): Payer: Medicare Other | Admitting: Family Medicine

## 2011-07-27 ENCOUNTER — Encounter (HOSPITAL_COMMUNITY)
Admission: RE | Admit: 2011-07-27 | Discharge: 2011-07-27 | Disposition: A | Discharge status: Home / Self Care | Payer: Medicare Other | Source: Ambulatory Visit | Attending: Nephrology | Admitting: Nephrology

## 2011-07-27 ENCOUNTER — Encounter: Payer: Self-pay | Admitting: Family Medicine

## 2011-07-27 VITALS — BP 125/88 | HR 101 | Temp 98.3°F | Ht 66.0 in | Wt 192.0 lb

## 2011-07-27 DIAGNOSIS — IMO0001 Reserved for inherently not codable concepts without codable children: Secondary | ICD-10-CM | POA: Diagnosis not present

## 2011-07-27 DIAGNOSIS — IMO0002 Reserved for concepts with insufficient information to code with codable children: Secondary | ICD-10-CM

## 2011-07-27 DIAGNOSIS — N183 Chronic kidney disease, stage 3 unspecified: Secondary | ICD-10-CM | POA: Insufficient documentation

## 2011-07-27 DIAGNOSIS — D638 Anemia in other chronic diseases classified elsewhere: Secondary | ICD-10-CM | POA: Diagnosis not present

## 2011-07-27 DIAGNOSIS — I10 Essential (primary) hypertension: Secondary | ICD-10-CM

## 2011-07-27 DIAGNOSIS — E1165 Type 2 diabetes mellitus with hyperglycemia: Secondary | ICD-10-CM

## 2011-07-27 DIAGNOSIS — R112 Nausea with vomiting, unspecified: Secondary | ICD-10-CM

## 2011-07-27 DIAGNOSIS — E669 Obesity, unspecified: Secondary | ICD-10-CM

## 2011-07-27 LAB — DIFFERENTIAL
Basophils Absolute: 0 10*3/uL (ref 0.0–0.1)
Eosinophils Relative: 1 % (ref 0–5)
Lymphocytes Relative: 20 % (ref 12–46)
Monocytes Absolute: 0.4 10*3/uL (ref 0.1–1.0)
Monocytes Relative: 4 % (ref 3–12)
Neutro Abs: 7.3 10*3/uL (ref 1.7–7.7)

## 2011-07-27 LAB — URINALYSIS, ROUTINE W REFLEX MICROSCOPIC
Glucose, UA: 100 mg/dL — AB
Hgb urine dipstick: NEGATIVE
Ketones, ur: NEGATIVE mg/dL
Leukocytes, UA: NEGATIVE
Protein, ur: NEGATIVE mg/dL
pH: 5 (ref 5.0–8.0)

## 2011-07-27 LAB — CBC
HCT: 34.4 % — ABNORMAL LOW (ref 36.0–46.0)
Hemoglobin: 11.1 g/dL — ABNORMAL LOW (ref 12.0–15.0)
MCV: 90.1 fL (ref 78.0–100.0)
RDW: 14.1 % (ref 11.5–15.5)
WBC: 9.8 10*3/uL (ref 4.0–10.5)

## 2011-07-27 LAB — IRON AND TIBC
Iron: 56 ug/dL (ref 42–135)
Saturation Ratios: 21 % (ref 20–55)
TIBC: 262 ug/dL (ref 250–470)

## 2011-07-27 LAB — RENAL FUNCTION PANEL
BUN: 29 mg/dL — ABNORMAL HIGH (ref 6–23)
CO2: 18 mEq/L — ABNORMAL LOW (ref 19–32)
Chloride: 105 mEq/L (ref 96–112)
Creatinine, Ser: 1.35 mg/dL — ABNORMAL HIGH (ref 0.50–1.10)
Glucose, Bld: 214 mg/dL — ABNORMAL HIGH (ref 70–99)
Potassium: 4.4 mEq/L (ref 3.5–5.1)

## 2011-07-27 LAB — PROTEIN / CREATININE RATIO, URINE: Creatinine, Urine: 145.19 mg/dL

## 2011-07-27 LAB — FERRITIN: Ferritin: 905 ng/mL — ABNORMAL HIGH (ref 10–291)

## 2011-07-27 MED ORDER — EPOETIN ALFA 20000 UNIT/ML IJ SOLN
INTRAMUSCULAR | Status: AC
  Administered 2011-07-27: 20000 [IU] via SUBCUTANEOUS
  Filled 2011-07-27: qty 1

## 2011-07-27 MED ORDER — EPOETIN ALFA 40000 UNIT/ML IJ SOLN: 25000.0000 [IU] | INTRAMUSCULAR | Status: DC

## 2011-07-27 MED ORDER — EPOETIN ALFA 10000 UNIT/ML IJ SOLN
INTRAMUSCULAR | Status: AC
  Administered 2011-07-27: 5000 [IU] via SUBCUTANEOUS
  Filled 2011-07-27: qty 1

## 2011-07-27 MED ORDER — ONDANSETRON HCL 4 MG PO TABS: 4.0000 mg | ORAL_TABLET | Freq: Two times a day (BID) | ORAL | Status: DC | PRN

## 2011-07-27 NOTE — Assessment & Plan Note (Signed)
Well-controlled, continue current medications

## 2011-07-27 NOTE — Progress Notes (Signed)
  Subjective:    Patient ID: [name], female    DOB: [birth year], 26 y.o.   MRN: [id number]  HPI  [name] comes in for follow up of Diabetes.  She is upset because her insurance has switched from Lantus to Levemir.  She has been reading about all sorts of thing on the Internet about side effects of Levemir, and trying to figure out which medication is better. She has not yet run out of Lantus and says she is taking 100 units daily, and around 60 units of novolog throughout the day. AM sugars are mid 100's- low 200's, and PM sugars are 200-300 per her log.  She says she has switched from white potatoes to sweet potatoes, but is still drinking Lemon aide with real sugar and eats potato chips for snacks. She still says that she does not "own her diabetes" because it is from the prednisone she takes from her kidney transplant.  She again becomes frustrated when reminded that it does not matter why her blood sugar is high, just that it is high.  She has not been able to see the diabetic nutritionist because she has been out on maternity leave.  [name] says the offered her classes but she does not want to go to a class, she likes one on one teaching. She denies polyuria, polydipsia, hypoglycemia.   HTN- taking medications, no chest pain, dyspnea, palpitations.   Obesity- says she is trying to work on her diet and exercise, but has not lost much weight.   Review of Systems Pertinent items in HPI.     Objective:   Physical Exam BP 125/88  Pulse 101  Temp(Src) 98.3 F (36.8 C) (Oral)  Ht 5\' 6"  (1.676 m)  Wt 192 lb (87.091 kg)  BMI 30.99 kg/m2 General appearance: alert, cooperative and no distress Head: Normocephalic, without obvious abnormality, atraumatic Neck: supple, symmetrical, trachea midline and thyroid not enlarged, symmetric, no tenderness/mass/nodules Lungs: clear to auscultation bilaterally Heart: regular rate and rhythm, S1, S2 normal, no murmur, click, rub or gallop Extremities:  extremities normal, atraumatic, no cyanosis or edema Pulses: 2+ and symmetric       Assessment & Plan:

## 2011-07-27 NOTE — Assessment & Plan Note (Signed)
No improvement, continued to encourage dietary changes and exercise.  Again advised calling the nutritionist.

## 2011-07-27 NOTE — Assessment & Plan Note (Signed)
Mild improvement of A1C, now 11.1.  Medicare Part D has switched to prefer Levemir, and [name] has never been on Levemir before.  Reassured her that Lantus has a simlilar side effect profile.  I spent >30 minutes of face to face time discussing Levemir switch and Diabetes management and goals with patient.  She continues to blame prednisone for her hyperglycemia, and blood sugar control continues to be poor.  For now, will advise Levemir 50 units BID and continue novolog with meals. Follow up in 3 months.  Advised calling nutritionist again for an appointment.

## 2011-07-27 NOTE — Patient Instructions (Signed)
It was good to see you today.  Your Hemoglobin A1C is  Lab Results  Component Value Date   HGBA1C 11.1 07/27/2011  .  Remember your goal for A1C is less than 7.  Your goal for fasting morning blood sugar is 80-120.   I want you to take 50 units of Levemir in the morning and 50 at night.  Please continue to take your novolog with your meals.  Your blood pressure today was BP: 125/88 mmHg.  Remember your goal blood pressure is about 120/80.  Please be sure to take your medication every day.    I have sent ondansetron for nausea to the pharmacy.

## 2011-08-10 ENCOUNTER — Encounter (HOSPITAL_COMMUNITY)
Admission: RE | Admit: 2011-08-10 | Discharge: 2011-08-10 | Disposition: A | Discharge status: Home / Self Care | Payer: Medicare Other | Source: Ambulatory Visit | Attending: Nephrology | Admitting: Nephrology

## 2011-08-10 LAB — POCT HEMOGLOBIN-HEMACUE: Hemoglobin: 10.4 g/dL — ABNORMAL LOW (ref 12.0–15.0)

## 2011-08-10 MED ORDER — EPOETIN ALFA 40000 UNIT/ML IJ SOLN: 25000.0000 [IU] | INTRAMUSCULAR | Status: DC

## 2011-08-10 MED ORDER — EPOETIN ALFA 20000 UNIT/ML IJ SOLN
INTRAMUSCULAR | Status: AC
  Administered 2011-08-10: 20000 [IU] via SUBCUTANEOUS
  Filled 2011-08-10: qty 1

## 2011-08-10 MED ORDER — EPOETIN ALFA 10000 UNIT/ML IJ SOLN
INTRAMUSCULAR | Status: AC
  Administered 2011-08-10: 5000 [IU] via SUBCUTANEOUS
  Filled 2011-08-10: qty 1

## 2011-08-11 ENCOUNTER — Telehealth: Payer: Self-pay | Admitting: Family Medicine

## 2011-08-11 NOTE — Telephone Encounter (Signed)
Patient thinks she may be having a reaction to a new Diabetic medication.  Her muscles are tightening up and heart and she wants to speak to the nurse about this.

## 2011-08-11 NOTE — Telephone Encounter (Signed)
Tightening in back, thighs and calf x 3 days. Cramping not improved with walking, tylenol or cramping. Taking Levemir 60 daily x 10 days. Wants to know if levemir is causing cramping.  She reports checking blood sugars every day, lately 200-340. Advised patient to continue levemir as it is not associated with cramping/ Advised patient to drink lots of water, avoid being out in the heat. Advised she call in for visit to be evaluate cramping if it persist. She voiced understanding and stated that she has also called Dr. Lorrene Reid and will wait to speak with her.

## 2011-08-11 NOTE — Telephone Encounter (Signed)
Back of legs and thighs have been tightening up.  Has been drinking water and taking calcium.  Symptoms started 3 days ago.  Cramping also going into her back.  Patient states symptoms occurred when she started on Levemir.  Patient thinks Levemir may be interacting with some of her other meds.  Dr. Barbra Sarks not in office this week.  Will route note to Dr. Adrian Blackwater for advice and call patient back.  [name], RN

## 2011-08-12 ENCOUNTER — Ambulatory Visit: Payer: Medicare Other | Admitting: Family Medicine

## 2011-08-17 DIAGNOSIS — D649 Anemia, unspecified: Secondary | ICD-10-CM | POA: Diagnosis not present

## 2011-08-17 DIAGNOSIS — I10 Essential (primary) hypertension: Secondary | ICD-10-CM | POA: Diagnosis not present

## 2011-08-17 DIAGNOSIS — E119 Type 2 diabetes mellitus without complications: Secondary | ICD-10-CM | POA: Diagnosis not present

## 2011-08-17 DIAGNOSIS — Z940 Kidney transplant status: Secondary | ICD-10-CM | POA: Diagnosis not present

## 2011-08-25 ENCOUNTER — Encounter (HOSPITAL_COMMUNITY)
Admission: RE | Admit: 2011-08-25 | Discharge: 2011-08-25 | Disposition: A | Discharge status: Home / Self Care | Payer: Medicare Other | Source: Ambulatory Visit | Attending: Nephrology | Admitting: Nephrology

## 2011-08-25 DIAGNOSIS — D638 Anemia in other chronic diseases classified elsewhere: Secondary | ICD-10-CM | POA: Insufficient documentation

## 2011-08-25 DIAGNOSIS — N183 Chronic kidney disease, stage 3 unspecified: Secondary | ICD-10-CM | POA: Insufficient documentation

## 2011-08-25 LAB — URINE MICROSCOPIC-ADD ON

## 2011-08-25 LAB — URINALYSIS, ROUTINE W REFLEX MICROSCOPIC
Glucose, UA: >1000 mg/dL — AB
Hgb urine dipstick: NEGATIVE
Leukocytes, UA: NEGATIVE
Specific Gravity, Urine: 1.02 (ref 1.005–1.030)
pH: 5 (ref 5.0–8.0)

## 2011-08-25 LAB — CBC
HCT: 33.2 % — ABNORMAL LOW (ref 36.0–46.0)
Hemoglobin: 10.7 g/dL — ABNORMAL LOW (ref 12.0–15.0)
MCV: 89.5 fL (ref 78.0–100.0)
RBC: 3.71 MIL/uL — ABNORMAL LOW (ref 3.87–5.11)
WBC: 10 10*3/uL (ref 4.0–10.5)

## 2011-08-25 LAB — RENAL FUNCTION PANEL
CO2: 21 mEq/L (ref 19–32)
Chloride: 102 mEq/L (ref 96–112)
Creatinine, Ser: 1.71 mg/dL — ABNORMAL HIGH (ref 0.50–1.10)
GFR calc Af Amer: 46 mL/min — ABNORMAL LOW (ref >90)
GFR calc non Af Amer: 40 mL/min — ABNORMAL LOW (ref >90)
Potassium: 4.6 mEq/L (ref 3.5–5.1)

## 2011-08-25 LAB — DIFFERENTIAL
Eosinophils Relative: 1 % (ref 0–5)
Lymphocytes Relative: 22 % (ref 12–46)
Lymphs Abs: 2.2 10*3/uL (ref 0.7–4.0)
Monocytes Absolute: 0.4 10*3/uL (ref 0.1–1.0)
Neutro Abs: 7.3 10*3/uL (ref 1.7–7.7)

## 2011-08-25 LAB — IRON AND TIBC
Iron: 67 ug/dL (ref 42–135)
Saturation Ratios: 25 % (ref 20–55)
TIBC: 273 ug/dL (ref 250–470)

## 2011-08-25 LAB — PROTEIN / CREATININE RATIO, URINE: Creatinine, Urine: 39.91 mg/dL

## 2011-08-25 MED ORDER — EPOETIN ALFA 40000 UNIT/ML IJ SOLN: 25000.0000 [IU] | INTRAMUSCULAR | Status: DC

## 2011-08-25 MED ORDER — EPOETIN ALFA 20000 UNIT/ML IJ SOLN
INTRAMUSCULAR | Status: AC
  Filled 2011-08-25: qty 1

## 2011-08-25 MED ORDER — EPOETIN ALFA 10000 UNIT/ML IJ SOLN
INTRAMUSCULAR | Status: AC
  Administered 2011-08-25: 25000 [IU]
  Filled 2011-08-25: qty 1

## 2011-08-30 ENCOUNTER — Other Ambulatory Visit: Payer: Self-pay | Admitting: Family Medicine

## 2011-09-07 ENCOUNTER — Encounter (HOSPITAL_COMMUNITY)
Admission: RE | Admit: 2011-09-07 | Discharge: 2011-09-07 | Disposition: A | Discharge status: Home / Self Care | Payer: Medicare Other | Source: Ambulatory Visit | Attending: Nephrology | Admitting: Nephrology

## 2011-09-07 DIAGNOSIS — N183 Chronic kidney disease, stage 3 unspecified: Secondary | ICD-10-CM | POA: Diagnosis not present

## 2011-09-07 DIAGNOSIS — D638 Anemia in other chronic diseases classified elsewhere: Secondary | ICD-10-CM | POA: Diagnosis not present

## 2011-09-07 MED ORDER — EPOETIN ALFA 10000 UNIT/ML IJ SOLN
INTRAMUSCULAR | Status: AC
  Administered 2011-09-07: 5000 [IU] via SUBCUTANEOUS
  Filled 2011-09-07: qty 1

## 2011-09-07 MED ORDER — EPOETIN ALFA 40000 UNIT/ML IJ SOLN: 25000.0000 [IU] | INTRAMUSCULAR | Status: DC

## 2011-09-07 MED ORDER — EPOETIN ALFA 20000 UNIT/ML IJ SOLN
INTRAMUSCULAR | Status: AC
  Administered 2011-09-07: 20000 [IU] via SUBCUTANEOUS
  Filled 2011-09-07: qty 1

## 2011-09-21 ENCOUNTER — Encounter (HOSPITAL_COMMUNITY)
Admission: RE | Admit: 2011-09-21 | Discharge: 2011-09-21 | Disposition: A | Discharge status: Home / Self Care | Payer: Medicare Other | Source: Ambulatory Visit | Attending: Nephrology | Admitting: Nephrology

## 2011-09-21 DIAGNOSIS — N183 Chronic kidney disease, stage 3 unspecified: Secondary | ICD-10-CM | POA: Diagnosis not present

## 2011-09-21 DIAGNOSIS — D638 Anemia in other chronic diseases classified elsewhere: Secondary | ICD-10-CM | POA: Insufficient documentation

## 2011-09-21 LAB — URINE MICROSCOPIC-ADD ON

## 2011-09-21 LAB — HEPATIC FUNCTION PANEL
ALT: 18 U/L (ref 0–35)
Bilirubin, Direct: <0.1 mg/dL (ref 0.0–0.3)
Total Bilirubin: 0.3 mg/dL (ref 0.3–1.2)

## 2011-09-21 LAB — DIFFERENTIAL
Eosinophils Absolute: 0.1 10*3/uL (ref 0.0–0.7)
Eosinophils Relative: 1 % (ref 0–5)
Lymphs Abs: 1.9 10*3/uL (ref 0.7–4.0)

## 2011-09-21 LAB — IRON AND TIBC
Iron: 64 ug/dL (ref 42–135)
Saturation Ratios: 21 % (ref 20–55)
TIBC: 305 ug/dL (ref 250–470)
UIBC: 241 ug/dL (ref 125–400)

## 2011-09-21 LAB — URINALYSIS, ROUTINE W REFLEX MICROSCOPIC
Bilirubin Urine: NEGATIVE
Hgb urine dipstick: NEGATIVE
Protein, ur: NEGATIVE mg/dL
Urobilinogen, UA: 0.2 mg/dL (ref 0.0–1.0)

## 2011-09-21 LAB — CBC
MCH: 28.8 pg (ref 26.0–34.0)
MCV: 90.7 fL (ref 78.0–100.0)
Platelets: 226 10*3/uL (ref 150–400)
RBC: 3.64 MIL/uL — ABNORMAL LOW (ref 3.87–5.11)
RDW: 14.1 % (ref 11.5–15.5)

## 2011-09-21 LAB — RENAL FUNCTION PANEL
CO2: 15 mEq/L — ABNORMAL LOW (ref 19–32)
GFR calc Af Amer: 43 mL/min — ABNORMAL LOW (ref >90)
Glucose, Bld: 403 mg/dL — ABNORMAL HIGH (ref 70–99)
Potassium: 6.4 mEq/L (ref 3.5–5.1)
Sodium: 130 mEq/L — ABNORMAL LOW (ref 135–145)

## 2011-09-21 MED ORDER — EPOETIN ALFA 10000 UNIT/ML IJ SOLN
INTRAMUSCULAR | Status: AC
  Administered 2011-09-21: 5000 [IU] via SUBCUTANEOUS
  Filled 2011-09-21: qty 1

## 2011-09-21 MED ORDER — EPOETIN ALFA 40000 UNIT/ML IJ SOLN: 25000.0000 [IU] | INTRAMUSCULAR | Status: DC

## 2011-09-21 MED ORDER — EPOETIN ALFA 20000 UNIT/ML IJ SOLN
INTRAMUSCULAR | Status: AC
  Administered 2011-09-21: 20000 [IU] via SUBCUTANEOUS
  Filled 2011-09-21: qty 1

## 2011-09-22 DIAGNOSIS — E875 Hyperkalemia: Secondary | ICD-10-CM | POA: Diagnosis not present

## 2011-09-22 LAB — TACROLIMUS LEVEL: Tacrolimus (FK506) - LabCorp: 7.8 ng/mL

## 2011-09-29 DIAGNOSIS — B9789 Other viral agents as the cause of diseases classified elsewhere: Secondary | ICD-10-CM | POA: Diagnosis not present

## 2011-09-29 DIAGNOSIS — Z940 Kidney transplant status: Secondary | ICD-10-CM | POA: Diagnosis not present

## 2011-09-29 DIAGNOSIS — Z79899 Other long term (current) drug therapy: Secondary | ICD-10-CM | POA: Diagnosis not present

## 2011-10-04 ENCOUNTER — Other Ambulatory Visit (HOSPITAL_COMMUNITY): Payer: Self-pay | Admitting: *Deleted

## 2011-10-05 ENCOUNTER — Encounter (HOSPITAL_COMMUNITY)
Admission: RE | Admit: 2011-10-05 | Discharge: 2011-10-05 | Disposition: A | Discharge status: Home / Self Care | Payer: Medicare Other | Source: Ambulatory Visit | Attending: Nephrology | Admitting: Nephrology

## 2011-10-05 MED ORDER — EPOETIN ALFA 10000 UNIT/ML IJ SOLN
INTRAMUSCULAR | Status: AC
  Administered 2011-10-05: 5000 [IU] via SUBCUTANEOUS
  Filled 2011-10-05: qty 1

## 2011-10-05 MED ORDER — EPOETIN ALFA 20000 UNIT/ML IJ SOLN
INTRAMUSCULAR | Status: AC
  Administered 2011-10-05: 20000 [IU] via SUBCUTANEOUS
  Filled 2011-10-05: qty 1

## 2011-10-05 MED ORDER — EPOETIN ALFA 40000 UNIT/ML IJ SOLN: 25000.0000 [IU] | INTRAMUSCULAR | Status: DC

## 2011-10-14 DIAGNOSIS — Z940 Kidney transplant status: Secondary | ICD-10-CM | POA: Diagnosis not present

## 2011-10-14 DIAGNOSIS — D8989 Other specified disorders involving the immune mechanism, not elsewhere classified: Secondary | ICD-10-CM | POA: Diagnosis not present

## 2011-10-14 DIAGNOSIS — R7989 Other specified abnormal findings of blood chemistry: Secondary | ICD-10-CM | POA: Diagnosis not present

## 2011-10-14 DIAGNOSIS — Z79899 Other long term (current) drug therapy: Secondary | ICD-10-CM | POA: Diagnosis not present

## 2011-10-19 ENCOUNTER — Encounter (HOSPITAL_COMMUNITY)
Admission: RE | Admit: 2011-10-19 | Discharge: 2011-10-19 | Disposition: A | Discharge status: Home / Self Care | Payer: Medicare Other | Source: Ambulatory Visit | Attending: Nephrology | Admitting: Nephrology

## 2011-10-19 DIAGNOSIS — D638 Anemia in other chronic diseases classified elsewhere: Secondary | ICD-10-CM | POA: Insufficient documentation

## 2011-10-19 DIAGNOSIS — N183 Chronic kidney disease, stage 3 unspecified: Secondary | ICD-10-CM | POA: Diagnosis not present

## 2011-10-19 LAB — DIFFERENTIAL
Basophils Absolute: 0 10*3/uL (ref 0.0–0.1)
Basophils Relative: 0 % (ref 0–1)
Monocytes Absolute: 0.5 10*3/uL (ref 0.1–1.0)
Neutro Abs: 5 10*3/uL (ref 1.7–7.7)
Neutrophils Relative %: 68 % (ref 43–77)

## 2011-10-19 LAB — RENAL FUNCTION PANEL
Albumin: 4 g/dL (ref 3.5–5.2)
BUN: 45 mg/dL — ABNORMAL HIGH (ref 6–23)
CO2: 17 mEq/L — ABNORMAL LOW (ref 19–32)
Chloride: 108 mEq/L (ref 96–112)
Creatinine, Ser: 1.62 mg/dL — ABNORMAL HIGH (ref 0.50–1.10)

## 2011-10-19 LAB — CBC
MCHC: 32.6 g/dL (ref 30.0–36.0)
RDW: 14.4 % (ref 11.5–15.5)

## 2011-10-19 LAB — URINALYSIS, ROUTINE W REFLEX MICROSCOPIC
Ketones, ur: NEGATIVE mg/dL
Leukocytes, UA: NEGATIVE
Nitrite: NEGATIVE
Protein, ur: NEGATIVE mg/dL

## 2011-10-19 LAB — IRON AND TIBC
Saturation Ratios: 20 % (ref 20–55)
TIBC: 240 ug/dL — ABNORMAL LOW (ref 250–470)

## 2011-10-19 LAB — FERRITIN: Ferritin: 1030 ng/mL — ABNORMAL HIGH (ref 10–291)

## 2011-10-19 MED ORDER — EPOETIN ALFA 10000 UNIT/ML IJ SOLN
INTRAMUSCULAR | Status: AC
  Administered 2011-10-19: 5000 [IU] via SUBCUTANEOUS
  Filled 2011-10-19: qty 1

## 2011-10-19 MED ORDER — EPOETIN ALFA 40000 UNIT/ML IJ SOLN: 25000.0000 [IU] | INTRAMUSCULAR | Status: DC

## 2011-10-19 MED ORDER — EPOETIN ALFA 20000 UNIT/ML IJ SOLN
INTRAMUSCULAR | Status: AC
  Administered 2011-10-19: 20000 [IU] via SUBCUTANEOUS
  Filled 2011-10-19: qty 1

## 2011-10-20 DIAGNOSIS — I10 Essential (primary) hypertension: Secondary | ICD-10-CM | POA: Diagnosis not present

## 2011-10-20 DIAGNOSIS — E875 Hyperkalemia: Secondary | ICD-10-CM | POA: Diagnosis not present

## 2011-10-20 DIAGNOSIS — Z940 Kidney transplant status: Secondary | ICD-10-CM | POA: Diagnosis not present

## 2011-10-22 DIAGNOSIS — E871 Hypo-osmolality and hyponatremia: Secondary | ICD-10-CM | POA: Diagnosis not present

## 2011-10-26 ENCOUNTER — Ambulatory Visit (INDEPENDENT_AMBULATORY_CARE_PROVIDER_SITE_OTHER): Payer: Medicare Other | Admitting: Family Medicine

## 2011-10-26 ENCOUNTER — Encounter: Payer: Self-pay | Admitting: Family Medicine

## 2011-10-26 VITALS — BP 130/80 | HR 72 | Ht 66.0 in | Wt 197.4 lb

## 2011-10-26 DIAGNOSIS — IMO0002 Reserved for concepts with insufficient information to code with codable children: Secondary | ICD-10-CM

## 2011-10-26 DIAGNOSIS — IMO0001 Reserved for inherently not codable concepts without codable children: Secondary | ICD-10-CM

## 2011-10-26 DIAGNOSIS — I10 Essential (primary) hypertension: Secondary | ICD-10-CM | POA: Diagnosis not present

## 2011-10-26 DIAGNOSIS — E669 Obesity, unspecified: Secondary | ICD-10-CM | POA: Diagnosis not present

## 2011-10-26 DIAGNOSIS — E1165 Type 2 diabetes mellitus with hyperglycemia: Secondary | ICD-10-CM

## 2011-10-26 LAB — POCT GLYCOSYLATED HEMOGLOBIN (HGB A1C): Hemoglobin A1C: 10.7

## 2011-10-26 NOTE — Assessment & Plan Note (Signed)
[name] has gained a few lbs since last visit.  I encouraged her to continue to work with her nutritionist and to try to exercise more.

## 2011-10-26 NOTE — Assessment & Plan Note (Signed)
Relatively well controlled on current mediations.  No changes today.

## 2011-10-26 NOTE — Patient Instructions (Signed)
It was good to see you today.  Your Hemoglobin A1C is  Lab Results  Component Value Date   HGBA1C 10.7 10/26/2011   Remember your goal for A1C is less than 7.  Your goal for fasting morning blood sugar is 80-120.   Your blood pressure today was BP: 130/80 mmHg.  Remember your goal blood pressure is about 130/80.  Please be sure to take your medication every day.    When your fasting blood sugar is higher than 150, I want you to write down what you ate the night before.    Please come back and see me in about 3 months.

## 2011-10-26 NOTE — Progress Notes (Signed)
  Subjective:    Patient ID: [name], female    DOB: 06/12/1985, 26 y.o.   MRN: [id number]  HPI  [name] comes in for follow up.   DM- has switched back to lantus, says she is taking 100 units daily.  She says she takes 20 units of novolog with each meal.  She has recorded her fasting blood sugars and an afternoon one, fasting sugars range from 80-250.  She says she has been avoiding bread and ice cream.   BP- taking prinizide, no side effects.  Records home BP's, which have been well controlled.  No dizziness, palpitations, vision changes, headaches, chest pain.   Obesity- says she is doing some walking, but the heat has been making it hard.  Not tried other forms of exercise.  Trying to watch her diet.  Past Medical History  Diagnosis Date  . Chronic kidney disease   . Hypertension   . Hyperlipidemia   . Diabetes mellitus     Pt reports diagnosis in June 2011   Family History  Problem Relation Age of Onset  . Arthritis Mother   . Diabetes Mother   . Hypertension Mother    History  Substance Use Topics  . Smoking status: Never Smoker   . Smokeless tobacco: Never Used  . Alcohol Use: No    Review of Systems See HPI    Objective:   Physical Exam BP 130/80  Pulse 72  Ht 5\' 6"  (1.676 m)  Wt 197 lb 6.4 oz (89.54 kg)  BMI 31.86 kg/m2 General appearance: alert, cooperative and no distress Neck: no JVD, supple, symmetrical, trachea midline and thyroid not enlarged, symmetric, no tenderness/mass/nodules Lungs: clear to auscultation bilaterally Heart: regular rate and rhythm, S1, S2 normal, no murmur, click, rub or gallop Extremities: extremities normal, atraumatic, no cyanosis or edema Pulses: 2+ and symmetric       Assessment & Plan:

## 2011-10-26 NOTE — Assessment & Plan Note (Signed)
A1C is down to 10.7.  Encouraged patient that this is an improvement, but not yet at goal.  She claims to be taking a great deal of insulin, and it is difficult to believe her blood sugars are so high on the doses she says she is taking.  I have asked her to continue to titrate up her insulin, but also to start recording foods she ate the night before when she has a high blood sugar.  I am hoping she will start connecting certain foods with hyperglycemia.  Follow up in 3 months.

## 2011-11-01 ENCOUNTER — Other Ambulatory Visit (HOSPITAL_COMMUNITY): Payer: Self-pay | Admitting: *Deleted

## 2011-11-01 MED ORDER — SODIUM CHLORIDE 0.9 % IV SOLN: Freq: Once | INTRAVENOUS | Status: DC

## 2011-11-01 MED ORDER — FERUMOXYTOL INJECTION 510 MG/17 ML: 510.0000 mg | Freq: Once | INTRAVENOUS | Status: DC

## 2011-11-02 ENCOUNTER — Encounter (HOSPITAL_COMMUNITY)
Admission: RE | Admit: 2011-11-02 | Discharge: 2011-11-02 | Disposition: A | Discharge status: Home / Self Care | Payer: Medicare Other | Source: Ambulatory Visit | Attending: Nephrology | Admitting: Nephrology

## 2011-11-02 DIAGNOSIS — D638 Anemia in other chronic diseases classified elsewhere: Secondary | ICD-10-CM | POA: Diagnosis not present

## 2011-11-02 DIAGNOSIS — N183 Chronic kidney disease, stage 3 unspecified: Secondary | ICD-10-CM | POA: Diagnosis not present

## 2011-11-02 LAB — HEMOGLOBIN AND HEMATOCRIT, BLOOD: Hemoglobin: 10.6 g/dL — ABNORMAL LOW (ref 12.0–15.0)

## 2011-11-02 MED ORDER — FERUMOXYTOL INJECTION 510 MG/17 ML
510.0000 mg | Freq: Once | INTRAVENOUS | Status: AC
  Administered 2011-11-02: 510 mg via INTRAVENOUS
  Filled 2011-11-02: qty 17

## 2011-11-02 MED ORDER — EPOETIN ALFA 10000 UNIT/ML IJ SOLN
INTRAMUSCULAR | Status: AC
  Filled 2011-11-02: qty 1

## 2011-11-02 MED ORDER — EPOETIN ALFA 40000 UNIT/ML IJ SOLN: 25000.0000 [IU] | INTRAMUSCULAR | Status: DC

## 2011-11-02 MED ORDER — EPOETIN ALFA 20000 UNIT/ML IJ SOLN
INTRAMUSCULAR | Status: AC
  Administered 2011-11-02: 30000 [IU]
  Filled 2011-11-02: qty 1

## 2011-11-02 MED ORDER — SODIUM CHLORIDE 0.9 % IV SOLN
Freq: Once | INTRAVENOUS | Status: AC
  Administered 2011-11-02: 10:00:00 via INTRAVENOUS

## 2011-11-16 ENCOUNTER — Encounter (HOSPITAL_COMMUNITY)
Admission: RE | Admit: 2011-11-16 | Discharge: 2011-11-16 | Disposition: A | Discharge status: Home / Self Care | Payer: Medicare Other | Source: Ambulatory Visit | Attending: Nephrology | Admitting: Nephrology

## 2011-11-16 DIAGNOSIS — D8989 Other specified disorders involving the immune mechanism, not elsewhere classified: Secondary | ICD-10-CM | POA: Diagnosis not present

## 2011-11-16 DIAGNOSIS — E119 Type 2 diabetes mellitus without complications: Secondary | ICD-10-CM | POA: Diagnosis not present

## 2011-11-16 DIAGNOSIS — Z79899 Other long term (current) drug therapy: Secondary | ICD-10-CM | POA: Diagnosis not present

## 2011-11-16 DIAGNOSIS — N183 Chronic kidney disease, stage 3 unspecified: Secondary | ICD-10-CM | POA: Diagnosis not present

## 2011-11-16 DIAGNOSIS — E559 Vitamin D deficiency, unspecified: Secondary | ICD-10-CM | POA: Diagnosis not present

## 2011-11-16 DIAGNOSIS — Z940 Kidney transplant status: Secondary | ICD-10-CM | POA: Diagnosis not present

## 2011-11-16 DIAGNOSIS — D638 Anemia in other chronic diseases classified elsewhere: Secondary | ICD-10-CM | POA: Insufficient documentation

## 2011-11-16 DIAGNOSIS — R7989 Other specified abnormal findings of blood chemistry: Secondary | ICD-10-CM | POA: Diagnosis not present

## 2011-11-16 LAB — IRON AND TIBC
Iron: 66 ug/dL (ref 42–135)
Saturation Ratios: 26 % (ref 20–55)
TIBC: 254 ug/dL (ref 250–470)
UIBC: 188 ug/dL (ref 125–400)

## 2011-11-16 MED ORDER — EPOETIN ALFA 40000 UNIT/ML IJ SOLN: 25000.0000 [IU] | INTRAMUSCULAR | Status: DC

## 2011-11-16 MED ORDER — EPOETIN ALFA 20000 UNIT/ML IJ SOLN
INTRAMUSCULAR | Status: AC
  Administered 2011-11-16: 20000 [IU] via SUBCUTANEOUS
  Filled 2011-11-16: qty 1

## 2011-11-16 MED ORDER — EPOETIN ALFA 10000 UNIT/ML IJ SOLN
INTRAMUSCULAR | Status: AC
  Administered 2011-11-16: 5000 [IU] via SUBCUTANEOUS
  Filled 2011-11-16: qty 1

## 2011-11-29 ENCOUNTER — Other Ambulatory Visit (HOSPITAL_COMMUNITY): Payer: Self-pay | Admitting: *Deleted

## 2011-11-30 ENCOUNTER — Encounter (HOSPITAL_COMMUNITY)
Admission: RE | Admit: 2011-11-30 | Discharge: 2011-11-30 | Disposition: A | Discharge status: Home / Self Care | Payer: Medicare Other | Source: Ambulatory Visit | Attending: Nephrology | Admitting: Nephrology

## 2011-11-30 LAB — PROTEIN / CREATININE RATIO, URINE
Creatinine, Urine: 128.46 mg/dL
Total Protein, Urine: 4.2 mg/dL

## 2011-11-30 LAB — URINALYSIS, MICROSCOPIC ONLY
Bilirubin Urine: NEGATIVE
Glucose, UA: NEGATIVE mg/dL
Hgb urine dipstick: NEGATIVE
Specific Gravity, Urine: 1.018 (ref 1.005–1.030)
pH: 5 (ref 5.0–8.0)

## 2011-11-30 LAB — HEMOGLOBIN AND HEMATOCRIT, BLOOD: Hemoglobin: 10.5 g/dL — ABNORMAL LOW (ref 12.0–15.0)

## 2011-11-30 MED ORDER — EPOETIN ALFA 10000 UNIT/ML IJ SOLN
INTRAMUSCULAR | Status: AC
  Administered 2011-11-30: 5000 [IU] via SUBCUTANEOUS
  Filled 2011-11-30: qty 1

## 2011-11-30 MED ORDER — EPOETIN ALFA 20000 UNIT/ML IJ SOLN
INTRAMUSCULAR | Status: AC
  Administered 2011-11-30: 20000 [IU] via SUBCUTANEOUS
  Filled 2011-11-30: qty 1

## 2011-11-30 MED ORDER — EPOETIN ALFA 40000 UNIT/ML IJ SOLN: 25000.0000 [IU] | INTRAMUSCULAR | Status: DC

## 2011-12-02 ENCOUNTER — Encounter (HOSPITAL_COMMUNITY): Payer: Medicare Other

## 2011-12-06 LAB — CMV (CYTOMEGALOVIRUS) DNA ULTRAQUANT, PCR: CMV DNA Quant: <200 copies/mL (ref <200)

## 2011-12-14 ENCOUNTER — Encounter (HOSPITAL_COMMUNITY)
Admission: RE | Admit: 2011-12-14 | Discharge: 2011-12-14 | Disposition: A | Discharge status: Home / Self Care | Payer: Medicare Other | Source: Ambulatory Visit | Attending: Nephrology | Admitting: Nephrology

## 2011-12-14 DIAGNOSIS — N183 Chronic kidney disease, stage 3 unspecified: Secondary | ICD-10-CM | POA: Diagnosis not present

## 2011-12-14 DIAGNOSIS — Z79899 Other long term (current) drug therapy: Secondary | ICD-10-CM | POA: Diagnosis not present

## 2011-12-14 DIAGNOSIS — D638 Anemia in other chronic diseases classified elsewhere: Secondary | ICD-10-CM | POA: Diagnosis not present

## 2011-12-14 DIAGNOSIS — R7989 Other specified abnormal findings of blood chemistry: Secondary | ICD-10-CM | POA: Diagnosis not present

## 2011-12-14 DIAGNOSIS — Z940 Kidney transplant status: Secondary | ICD-10-CM | POA: Diagnosis not present

## 2011-12-14 DIAGNOSIS — D8989 Other specified disorders involving the immune mechanism, not elsewhere classified: Secondary | ICD-10-CM | POA: Diagnosis not present

## 2011-12-14 LAB — HEPATIC FUNCTION PANEL
AST: 15 U/L (ref 0–37)
Alkaline Phosphatase: 84 U/L (ref 39–117)
Bilirubin, Direct: <0.1 mg/dL (ref 0.0–0.3)
Total Bilirubin: 0.3 mg/dL (ref 0.3–1.2)

## 2011-12-14 LAB — IRON AND TIBC
Iron: 66 ug/dL (ref 42–135)
TIBC: 282 ug/dL (ref 250–470)
UIBC: 216 ug/dL (ref 125–400)

## 2011-12-14 LAB — HEMOGLOBIN AND HEMATOCRIT, BLOOD: Hemoglobin: 11.4 g/dL — ABNORMAL LOW (ref 12.0–15.0)

## 2011-12-14 MED ORDER — EPOETIN ALFA 20000 UNIT/ML IJ SOLN
INTRAMUSCULAR | Status: AC
  Administered 2011-12-14: 20000 [IU] via SUBCUTANEOUS
  Filled 2011-12-14: qty 1

## 2011-12-14 MED ORDER — EPOETIN ALFA 10000 UNIT/ML IJ SOLN
INTRAMUSCULAR | Status: AC
  Administered 2011-12-14: 5000 [IU] via SUBCUTANEOUS
  Filled 2011-12-14: qty 1

## 2011-12-14 MED ORDER — EPOETIN ALFA 40000 UNIT/ML IJ SOLN: 25000.0000 [IU] | INTRAMUSCULAR | Status: DC

## 2011-12-28 ENCOUNTER — Encounter (HOSPITAL_COMMUNITY)
Admission: RE | Admit: 2011-12-28 | Discharge: 2011-12-28 | Disposition: A | Discharge status: Home / Self Care | Payer: Medicare Other | Source: Ambulatory Visit | Attending: Nephrology | Admitting: Nephrology

## 2011-12-28 LAB — HEMOGLOBIN AND HEMATOCRIT, BLOOD
HCT: 34.3 % — ABNORMAL LOW (ref 36.0–46.0)
Hemoglobin: 11.1 g/dL — ABNORMAL LOW (ref 12.0–15.0)

## 2011-12-28 LAB — URINALYSIS, MICROSCOPIC ONLY
Bilirubin Urine: NEGATIVE
Ketones, ur: NEGATIVE mg/dL
Leukocytes, UA: NEGATIVE
Nitrite: NEGATIVE
Protein, ur: NEGATIVE mg/dL
Urobilinogen, UA: 0.2 mg/dL (ref 0.0–1.0)

## 2011-12-28 LAB — PROTEIN / CREATININE RATIO, URINE: Protein Creatinine Ratio: 0.05 (ref 0.00–0.15)

## 2011-12-28 MED ORDER — EPOETIN ALFA 20000 UNIT/ML IJ SOLN
INTRAMUSCULAR | Status: AC
  Administered 2011-12-28: 20000 [IU] via SUBCUTANEOUS
  Filled 2011-12-28: qty 1

## 2011-12-28 MED ORDER — EPOETIN ALFA 40000 UNIT/ML IJ SOLN: 25000.0000 [IU] | INTRAMUSCULAR | Status: DC

## 2011-12-28 MED ORDER — EPOETIN ALFA 10000 UNIT/ML IJ SOLN
INTRAMUSCULAR | Status: AC
  Administered 2011-12-28: 5000 [IU] via SUBCUTANEOUS
  Filled 2011-12-28: qty 1

## 2012-01-04 DIAGNOSIS — Z940 Kidney transplant status: Secondary | ICD-10-CM | POA: Diagnosis not present

## 2012-01-04 DIAGNOSIS — I10 Essential (primary) hypertension: Secondary | ICD-10-CM | POA: Diagnosis not present

## 2012-01-04 DIAGNOSIS — E119 Type 2 diabetes mellitus without complications: Secondary | ICD-10-CM | POA: Diagnosis not present

## 2012-01-04 DIAGNOSIS — R252 Cramp and spasm: Secondary | ICD-10-CM | POA: Diagnosis not present

## 2012-01-05 DIAGNOSIS — Z23 Encounter for immunization: Secondary | ICD-10-CM | POA: Diagnosis not present

## 2012-01-11 ENCOUNTER — Encounter (HOSPITAL_COMMUNITY)
Admission: RE | Admit: 2012-01-11 | Discharge: 2012-01-11 | Disposition: A | Discharge status: Home / Self Care | Payer: Medicare Other | Source: Ambulatory Visit | Attending: Nephrology | Admitting: Nephrology

## 2012-01-11 LAB — IRON AND TIBC
Iron: 66 ug/dL (ref 42–135)
TIBC: 277 ug/dL (ref 250–470)
UIBC: 211 ug/dL (ref 125–400)

## 2012-01-11 LAB — HEMOGLOBIN AND HEMATOCRIT, BLOOD: Hemoglobin: 11 g/dL — ABNORMAL LOW (ref 12.0–15.0)

## 2012-01-11 MED ORDER — EPOETIN ALFA 40000 UNIT/ML IJ SOLN: 25000.0000 [IU] | INTRAMUSCULAR | Status: DC

## 2012-01-11 MED ORDER — EPOETIN ALFA 10000 UNIT/ML IJ SOLN
INTRAMUSCULAR | Status: AC
  Administered 2012-01-11: 5000 [IU] via SUBCUTANEOUS
  Filled 2012-01-11: qty 1

## 2012-01-11 MED ORDER — EPOETIN ALFA 20000 UNIT/ML IJ SOLN
INTRAMUSCULAR | Status: AC
  Administered 2012-01-11: 20000 [IU] via SUBCUTANEOUS
  Filled 2012-01-11: qty 1

## 2012-01-20 ENCOUNTER — Ambulatory Visit (INDEPENDENT_AMBULATORY_CARE_PROVIDER_SITE_OTHER): Payer: Medicare Other | Admitting: Family Medicine

## 2012-01-20 ENCOUNTER — Other Ambulatory Visit: Payer: Self-pay | Admitting: Family Medicine

## 2012-01-20 ENCOUNTER — Encounter: Payer: Self-pay | Admitting: Family Medicine

## 2012-01-20 VITALS — BP 143/93 | HR 99 | Temp 98.3°F | Ht 66.0 in | Wt 188.0 lb

## 2012-01-20 DIAGNOSIS — D8989 Other specified disorders involving the immune mechanism, not elsewhere classified: Secondary | ICD-10-CM | POA: Diagnosis not present

## 2012-01-20 DIAGNOSIS — E669 Obesity, unspecified: Secondary | ICD-10-CM | POA: Diagnosis not present

## 2012-01-20 DIAGNOSIS — I10 Essential (primary) hypertension: Secondary | ICD-10-CM

## 2012-01-20 DIAGNOSIS — IMO0001 Reserved for inherently not codable concepts without codable children: Secondary | ICD-10-CM | POA: Diagnosis not present

## 2012-01-20 DIAGNOSIS — E1165 Type 2 diabetes mellitus with hyperglycemia: Secondary | ICD-10-CM

## 2012-01-20 DIAGNOSIS — Z940 Kidney transplant status: Secondary | ICD-10-CM | POA: Diagnosis not present

## 2012-01-20 DIAGNOSIS — R7989 Other specified abnormal findings of blood chemistry: Secondary | ICD-10-CM | POA: Diagnosis not present

## 2012-01-20 DIAGNOSIS — IMO0002 Reserved for concepts with insufficient information to code with codable children: Secondary | ICD-10-CM

## 2012-01-20 DIAGNOSIS — Z79899 Other long term (current) drug therapy: Secondary | ICD-10-CM | POA: Diagnosis not present

## 2012-01-20 LAB — LDL CHOLESTEROL, DIRECT: Direct LDL: 65 mg/dL

## 2012-01-20 MED ORDER — INSULIN PEN NEEDLE 32G X 4 MM MISC: Status: DC

## 2012-01-20 MED ORDER — INSULIN GLARGINE 100 UNIT/ML ~~LOC~~ SOLN: 100.0000 [IU] | Freq: Every day | SUBCUTANEOUS | Status: DC

## 2012-01-20 MED ORDER — INSULIN ASPART 100 UNIT/ML ~~LOC~~ SOLN: 20.0000 [IU] | Freq: Three times a day (TID) | SUBCUTANEOUS | Status: DC

## 2012-01-20 MED ORDER — SIMVASTATIN 20 MG PO TABS: 20.0000 mg | ORAL_TABLET | Freq: Every day | ORAL | Status: DC

## 2012-01-20 MED ORDER — SULFAMETHOXAZOLE-TRIMETHOPRIM 400-80 MG PO TABS: 1.0000 | ORAL_TABLET | Freq: Every day | ORAL | Status: DC

## 2012-01-20 NOTE — Assessment & Plan Note (Signed)
Slightly elevated in office today but log at home shows good control.  No changes today.

## 2012-01-20 NOTE — Patient Instructions (Signed)
It was good to see you today.  Your Hemoglobin A1C is  Lab Results  Component Value Date   HGBA1C 13.9 01/20/2012  .  Remember your goal for A1C is less than 7.  Your goal for fasting morning blood sugar is 80-120.     To help control your blood sugars, please avoid and minimize foods with lots of carbohydrates and sugars.  Those foods include breads, pastas, potatoes, corn, sweets and deserts.  Try to increase the amount and variety of vegetables you eat, and include a veggie with each meal.

## 2012-01-20 NOTE — Assessment & Plan Note (Addendum)
Encouraged her to continue to exercise and to make dietary changes.  Suspect she takes statin for plaque stabilization. Overdue for lipids, not fasting today, will check LDL.

## 2012-01-20 NOTE — Progress Notes (Signed)
  Subjective:    Patient ID: [name], female    DOB: [birth year], 26 y.o.   MRN: [id number]  HPI  DM: Patient is taking Lantus 100 and SSI.  Patient is checking blood sugars.  Fasting sugars range from 200 to 300's.  No polyuria or polydypisa.  She says she has been eating more sweet things lately due to birthdays and halloween, and that the prednisone makes her blood sugar go up.  She seems unconcerned about this.   Obesity: Patient is trying to make lifestyle modifications, she is trying to walk on the treadmill.  She has lost a few lbs.  Patient is taking simvastatin without difficulty, but does not carry dx of HLD.  Last lipid profile was almost 2 years ago.    HTN: Taking lisinopril/HCTZ without difficulty.  Denies chest pain, dizziness, palpitations, LE edema.  Patient does check blood pressure, they range from 120's/70's to 140's/90's.   Past Medical History  Diagnosis Date  . Chronic kidney disease   . Hypertension   . Hyperlipidemia   . Diabetes mellitus     Pt reports diagnosis in June 2011   Family History  Problem Relation Age of Onset  . Arthritis Mother   . Diabetes Mother   . Hypertension Mother    History  Substance Use Topics  . Smoking status: Never Smoker   . Smokeless tobacco: Never Used  . Alcohol Use: No   Review of Systems Negative except stated in HPI    Objective:   Physical Exam BP 143/93  Pulse 99  Temp 98.3 F (36.8 C) (Oral)  Ht 5\' 6"  (1.676 m)  Wt 188 lb (85.276 kg)  BMI 30.34 kg/m2 General appearance: alert, cooperative and no distress Lungs: clear to auscultation bilaterally Heart: regular rate and rhythm, S1, S2 normal, no murmur, click, rub or gallop Extremities: extremities normal, atraumatic, no cyanosis or edema Diabetic Foot exam: patient has normal sensation throughout feet.       Assessment & Plan:

## 2012-01-20 NOTE — Assessment & Plan Note (Signed)
Continues to be very poorly controlled, and patient does not grasp severity of this despite my repeated education.  Discussed diet and exercise, and taking insulin regularly.  She claims to be compliant.  Her foot exam was normal, she is overdue for an eye exam which I have made a referral for.

## 2012-01-24 ENCOUNTER — Other Ambulatory Visit (HOSPITAL_COMMUNITY): Payer: Self-pay | Admitting: *Deleted

## 2012-01-24 ENCOUNTER — Encounter: Payer: Self-pay | Admitting: Family Medicine

## 2012-01-24 ENCOUNTER — Encounter: Payer: Self-pay | Admitting: Home Health Services

## 2012-01-25 ENCOUNTER — Encounter (HOSPITAL_COMMUNITY)
Admission: RE | Admit: 2012-01-25 | Discharge: 2012-01-25 | Disposition: A | Discharge status: Home / Self Care | Payer: Medicare Other | Source: Ambulatory Visit | Attending: Nephrology | Admitting: Nephrology

## 2012-01-25 DIAGNOSIS — N183 Chronic kidney disease, stage 3 unspecified: Secondary | ICD-10-CM | POA: Insufficient documentation

## 2012-01-25 DIAGNOSIS — D638 Anemia in other chronic diseases classified elsewhere: Secondary | ICD-10-CM | POA: Diagnosis not present

## 2012-01-25 MED ORDER — EPOETIN ALFA 20000 UNIT/ML IJ SOLN
INTRAMUSCULAR | Status: AC
  Administered 2012-01-25: 20000 [IU]
  Filled 2012-01-25: qty 1

## 2012-01-25 MED ORDER — EPOETIN ALFA 40000 UNIT/ML IJ SOLN: 25000.0000 [IU] | INTRAMUSCULAR | Status: DC

## 2012-01-25 MED ORDER — EPOETIN ALFA 10000 UNIT/ML IJ SOLN
INTRAMUSCULAR | Status: AC
  Administered 2012-01-25: 5000 [IU]
  Filled 2012-01-25: qty 1

## 2012-01-26 ENCOUNTER — Encounter: Payer: Self-pay | Admitting: Home Health Services

## 2012-02-04 ENCOUNTER — Other Ambulatory Visit: Payer: Self-pay | Admitting: *Deleted

## 2012-02-04 DIAGNOSIS — E119 Type 2 diabetes mellitus without complications: Secondary | ICD-10-CM

## 2012-02-04 MED ORDER — "INSULIN SYRINGE-NEEDLE U-100 30G X 1/2"" 0.5 ML MISC": Status: DC

## 2012-02-07 ENCOUNTER — Encounter (HOSPITAL_COMMUNITY)
Admission: RE | Admit: 2012-02-07 | Discharge: 2012-02-07 | Disposition: A | Discharge status: Home / Self Care | Payer: Medicare Other | Source: Ambulatory Visit | Attending: Nephrology | Admitting: Nephrology

## 2012-02-07 LAB — IRON AND TIBC: TIBC: 262 ug/dL (ref 250–470)

## 2012-02-07 LAB — HEMOGLOBIN AND HEMATOCRIT, BLOOD: HCT: 31.6 % — ABNORMAL LOW (ref 36.0–46.0)

## 2012-02-07 MED ORDER — EPOETIN ALFA 20000 UNIT/ML IJ SOLN
INTRAMUSCULAR | Status: AC
  Administered 2012-02-07: 20000 [IU] via SUBCUTANEOUS
  Filled 2012-02-07: qty 1

## 2012-02-07 MED ORDER — EPOETIN ALFA 10000 UNIT/ML IJ SOLN
INTRAMUSCULAR | Status: AC
  Administered 2012-02-07: 5000 [IU] via SUBCUTANEOUS
  Filled 2012-02-07: qty 1

## 2012-02-07 MED ORDER — EPOETIN ALFA 40000 UNIT/ML IJ SOLN: 25000.0000 [IU] | INTRAMUSCULAR | Status: DC

## 2012-02-08 ENCOUNTER — Encounter (HOSPITAL_COMMUNITY): Payer: Medicare Other

## 2012-02-15 ENCOUNTER — Telehealth: Payer: Self-pay | Admitting: *Deleted

## 2012-02-15 NOTE — Telephone Encounter (Signed)
[facility] on [address] called to let Dr. Barbra Sarks know that patient no showed for her appointment today.  Their office number is [phone].   Will forward to Dr. Barbra Sarks for review.  Lauralyn Primes

## 2012-02-22 ENCOUNTER — Encounter (HOSPITAL_COMMUNITY)
Admission: RE | Admit: 2012-02-22 | Discharge: 2012-02-22 | Disposition: A | Discharge status: Home / Self Care | Payer: Medicare Other | Source: Ambulatory Visit | Attending: Nephrology | Admitting: Nephrology

## 2012-02-22 DIAGNOSIS — D638 Anemia in other chronic diseases classified elsewhere: Secondary | ICD-10-CM | POA: Insufficient documentation

## 2012-02-22 DIAGNOSIS — R7989 Other specified abnormal findings of blood chemistry: Secondary | ICD-10-CM | POA: Diagnosis not present

## 2012-02-22 DIAGNOSIS — D8989 Other specified disorders involving the immune mechanism, not elsewhere classified: Secondary | ICD-10-CM | POA: Diagnosis not present

## 2012-02-22 DIAGNOSIS — N183 Chronic kidney disease, stage 3 unspecified: Secondary | ICD-10-CM | POA: Diagnosis not present

## 2012-02-22 DIAGNOSIS — Z940 Kidney transplant status: Secondary | ICD-10-CM | POA: Diagnosis not present

## 2012-02-22 DIAGNOSIS — Z79899 Other long term (current) drug therapy: Secondary | ICD-10-CM | POA: Diagnosis not present

## 2012-02-22 LAB — IRON AND TIBC
Saturation Ratios: 33 % (ref 20–55)
UIBC: 179 ug/dL (ref 125–400)

## 2012-02-22 MED ORDER — EPOETIN ALFA 20000 UNIT/ML IJ SOLN
INTRAMUSCULAR | Status: AC
  Administered 2012-02-22: 20000 [IU] via SUBCUTANEOUS
  Filled 2012-02-22: qty 1

## 2012-02-22 MED ORDER — EPOETIN ALFA 10000 UNIT/ML IJ SOLN
INTRAMUSCULAR | Status: AC
  Administered 2012-02-22: 5000 [IU] via SUBCUTANEOUS
  Filled 2012-02-22: qty 1

## 2012-02-22 MED ORDER — EPOETIN ALFA 40000 UNIT/ML IJ SOLN: 25000.0000 [IU] | INTRAMUSCULAR | Status: DC

## 2012-03-06 ENCOUNTER — Encounter (HOSPITAL_COMMUNITY)
Admission: RE | Admit: 2012-03-06 | Discharge: 2012-03-06 | Disposition: A | Discharge status: Home / Self Care | Payer: Medicare Other | Source: Ambulatory Visit | Attending: Nephrology | Admitting: Nephrology

## 2012-03-06 LAB — HEMOGLOBIN AND HEMATOCRIT, BLOOD: Hemoglobin: 11 g/dL — ABNORMAL LOW (ref 12.0–15.0)

## 2012-03-06 MED ORDER — EPOETIN ALFA 20000 UNIT/ML IJ SOLN
INTRAMUSCULAR | Status: AC
  Administered 2012-03-06: 20000 [IU] via SUBCUTANEOUS
  Filled 2012-03-06: qty 1

## 2012-03-06 MED ORDER — EPOETIN ALFA 10000 UNIT/ML IJ SOLN
INTRAMUSCULAR | Status: AC
  Administered 2012-03-06: 5000 [IU] via SUBCUTANEOUS
  Filled 2012-03-06: qty 1

## 2012-03-06 MED ORDER — EPOETIN ALFA 40000 UNIT/ML IJ SOLN: 25000.0000 [IU] | INTRAMUSCULAR | Status: DC

## 2012-03-16 ENCOUNTER — Other Ambulatory Visit: Payer: Self-pay | Admitting: Family Medicine

## 2012-03-17 ENCOUNTER — Other Ambulatory Visit: Payer: Self-pay | Admitting: *Deleted

## 2012-03-17 DIAGNOSIS — E119 Type 2 diabetes mellitus without complications: Secondary | ICD-10-CM

## 2012-03-17 MED ORDER — INSULIN SYRINGES (DISPOSABLE) U-100 1 ML MISC: Status: DC

## 2012-03-17 NOTE — Telephone Encounter (Signed)
Patient came to office yesterday requesting BD insulin syringes , 31 gauge , 6 mm needles and 1 mL syringe. This was called to [facility] on [address]

## 2012-03-21 ENCOUNTER — Encounter (HOSPITAL_COMMUNITY)
Admission: RE | Admit: 2012-03-21 | Discharge: 2012-03-21 | Disposition: A | Discharge status: Home / Self Care | Payer: Medicare Other | Source: Ambulatory Visit | Attending: Nephrology | Admitting: Nephrology

## 2012-03-21 DIAGNOSIS — Z940 Kidney transplant status: Secondary | ICD-10-CM | POA: Diagnosis not present

## 2012-03-21 DIAGNOSIS — D638 Anemia in other chronic diseases classified elsewhere: Secondary | ICD-10-CM | POA: Diagnosis not present

## 2012-03-21 DIAGNOSIS — Z79899 Other long term (current) drug therapy: Secondary | ICD-10-CM | POA: Diagnosis not present

## 2012-03-21 DIAGNOSIS — D8989 Other specified disorders involving the immune mechanism, not elsewhere classified: Secondary | ICD-10-CM | POA: Diagnosis not present

## 2012-03-21 DIAGNOSIS — N183 Chronic kidney disease, stage 3 unspecified: Secondary | ICD-10-CM | POA: Insufficient documentation

## 2012-03-21 DIAGNOSIS — R7989 Other specified abnormal findings of blood chemistry: Secondary | ICD-10-CM | POA: Diagnosis not present

## 2012-03-21 LAB — HEMOGLOBIN AND HEMATOCRIT, BLOOD: Hemoglobin: 11.1 g/dL — ABNORMAL LOW (ref 12.0–15.0)

## 2012-03-21 LAB — IRON AND TIBC
Iron: 69 ug/dL (ref 42–135)
TIBC: 275 ug/dL (ref 250–470)

## 2012-03-21 MED ORDER — EPOETIN ALFA 20000 UNIT/ML IJ SOLN
INTRAMUSCULAR | Status: AC
  Administered 2012-03-21: 20000 [IU] via SUBCUTANEOUS
  Filled 2012-03-21: qty 1

## 2012-03-21 MED ORDER — EPOETIN ALFA 40000 UNIT/ML IJ SOLN: 25000.0000 [IU] | INTRAMUSCULAR | Status: DC

## 2012-03-21 MED ORDER — EPOETIN ALFA 10000 UNIT/ML IJ SOLN
INTRAMUSCULAR | Status: AC
  Administered 2012-03-21: 10000 [IU] via SUBCUTANEOUS
  Filled 2012-03-21: qty 1

## 2012-03-31 ENCOUNTER — Telehealth: Payer: Self-pay | Admitting: Family Medicine

## 2012-03-31 NOTE — Telephone Encounter (Signed)
Patient is having problems with her Diabetes medication that is causing her cramps and she needs to speak to the nurse right away.

## 2012-03-31 NOTE — Telephone Encounter (Signed)
Patient reports cramping in legs and thighs. States this has been going on for few months. She has discussed with PCP and Kidney doctor .  She has been Forensic psychologist .Continues with cramping and thinks it is due to Novolog insulin. Advised patient to continue insulin  as directed.and come in to discuss with doctor on Monday.  Appointment scheduled.

## 2012-04-03 ENCOUNTER — Ambulatory Visit: Payer: Medicare Other | Admitting: Family Medicine

## 2012-04-04 ENCOUNTER — Telehealth: Payer: Self-pay | Admitting: *Deleted

## 2012-04-04 ENCOUNTER — Encounter (HOSPITAL_COMMUNITY)
Admission: RE | Admit: 2012-04-04 | Discharge: 2012-04-04 | Disposition: A | Discharge status: Home / Self Care | Payer: Medicare Other | Source: Ambulatory Visit | Attending: Nephrology | Admitting: Nephrology

## 2012-04-04 MED ORDER — EPOETIN ALFA 20000 UNIT/ML IJ SOLN
INTRAMUSCULAR | Status: AC
  Administered 2012-04-04: 20000 [IU] via SUBCUTANEOUS
  Filled 2012-04-04: qty 1

## 2012-04-04 MED ORDER — EPOETIN ALFA 40000 UNIT/ML IJ SOLN: 25000.0000 [IU] | INTRAMUSCULAR | Status: DC

## 2012-04-04 MED ORDER — EPOETIN ALFA 10000 UNIT/ML IJ SOLN
INTRAMUSCULAR | Status: AC
  Administered 2012-04-04: 10000 [IU] via SUBCUTANEOUS
  Filled 2012-04-04: qty 1

## 2012-04-04 NOTE — Telephone Encounter (Signed)
Trudi Ida with [facility] left message on physician/pharmacy line informing us that Donnetta came in today for her appointment and they were running behind so Karmina was not able to complete her appointment today and did not wish to reschedule.  Will forward to Dr. Barbra Sarks for review.  Vaughan Basta can be reached at [phone].  Lauralyn Primes

## 2012-04-05 ENCOUNTER — Ambulatory Visit: Payer: Medicare Other | Admitting: Family Medicine

## 2012-04-13 DIAGNOSIS — E119 Type 2 diabetes mellitus without complications: Secondary | ICD-10-CM | POA: Diagnosis not present

## 2012-04-13 DIAGNOSIS — Z940 Kidney transplant status: Secondary | ICD-10-CM | POA: Diagnosis not present

## 2012-04-13 DIAGNOSIS — I10 Essential (primary) hypertension: Secondary | ICD-10-CM | POA: Diagnosis not present

## 2012-04-18 ENCOUNTER — Encounter (HOSPITAL_COMMUNITY)
Admission: RE | Admit: 2012-04-18 | Discharge: 2012-04-18 | Disposition: A | Discharge status: Home / Self Care | Payer: Medicare Other | Source: Ambulatory Visit | Attending: Nephrology | Admitting: Nephrology

## 2012-04-18 DIAGNOSIS — R7989 Other specified abnormal findings of blood chemistry: Secondary | ICD-10-CM | POA: Diagnosis not present

## 2012-04-18 DIAGNOSIS — N183 Chronic kidney disease, stage 3 unspecified: Secondary | ICD-10-CM | POA: Diagnosis not present

## 2012-04-18 DIAGNOSIS — D8989 Other specified disorders involving the immune mechanism, not elsewhere classified: Secondary | ICD-10-CM | POA: Diagnosis not present

## 2012-04-18 DIAGNOSIS — Z79899 Other long term (current) drug therapy: Secondary | ICD-10-CM | POA: Diagnosis not present

## 2012-04-18 DIAGNOSIS — D638 Anemia in other chronic diseases classified elsewhere: Secondary | ICD-10-CM | POA: Diagnosis not present

## 2012-04-18 DIAGNOSIS — Z940 Kidney transplant status: Secondary | ICD-10-CM | POA: Diagnosis not present

## 2012-04-18 LAB — IRON AND TIBC
Iron: 49 ug/dL (ref 42–135)
Saturation Ratios: 18 % — ABNORMAL LOW (ref 20–55)
TIBC: 269 ug/dL (ref 250–470)
UIBC: 220 ug/dL (ref 125–400)

## 2012-04-18 LAB — HEMOGLOBIN AND HEMATOCRIT, BLOOD: Hemoglobin: 11.6 g/dL — ABNORMAL LOW (ref 12.0–15.0)

## 2012-04-18 MED ORDER — EPOETIN ALFA 40000 UNIT/ML IJ SOLN: 25000.0000 [IU] | INTRAMUSCULAR | Status: DC

## 2012-04-18 MED ORDER — EPOETIN ALFA 20000 UNIT/ML IJ SOLN
INTRAMUSCULAR | Status: AC
  Administered 2012-04-18: 20000 [IU] via SUBCUTANEOUS
  Filled 2012-04-18: qty 1

## 2012-04-18 MED ORDER — EPOETIN ALFA 10000 UNIT/ML IJ SOLN
INTRAMUSCULAR | Status: AC
  Administered 2012-04-18: 5000 [IU] via SUBCUTANEOUS
  Filled 2012-04-18: qty 1

## 2012-04-20 ENCOUNTER — Ambulatory Visit: Payer: Medicare Other | Admitting: Family Medicine

## 2012-04-25 ENCOUNTER — Ambulatory Visit: Payer: Medicare Other | Admitting: Family Medicine

## 2012-05-01 ENCOUNTER — Other Ambulatory Visit (HOSPITAL_COMMUNITY): Payer: Self-pay | Admitting: *Deleted

## 2012-05-02 ENCOUNTER — Encounter (HOSPITAL_COMMUNITY)
Admission: RE | Admit: 2012-05-02 | Discharge: 2012-05-02 | Disposition: A | Discharge status: Home / Self Care | Payer: Medicare Other | Source: Ambulatory Visit | Attending: Nephrology | Admitting: Nephrology

## 2012-05-02 MED ORDER — EPOETIN ALFA 20000 UNIT/ML IJ SOLN
INTRAMUSCULAR | Status: AC
  Administered 2012-05-02: 20000 [IU] via SUBCUTANEOUS
  Filled 2012-05-02: qty 1

## 2012-05-02 MED ORDER — EPOETIN ALFA 40000 UNIT/ML IJ SOLN: 25000.0000 [IU] | INTRAMUSCULAR | Status: DC

## 2012-05-02 MED ORDER — EPOETIN ALFA 10000 UNIT/ML IJ SOLN
INTRAMUSCULAR | Status: AC
  Administered 2012-05-02: 5000 [IU] via SUBCUTANEOUS
  Filled 2012-05-02: qty 1

## 2012-05-08 ENCOUNTER — Other Ambulatory Visit: Payer: Self-pay | Admitting: Family Medicine

## 2012-05-10 ENCOUNTER — Telehealth: Payer: Self-pay | Admitting: Family Medicine

## 2012-05-10 ENCOUNTER — Ambulatory Visit (INDEPENDENT_AMBULATORY_CARE_PROVIDER_SITE_OTHER): Payer: Medicare Other | Admitting: Family Medicine

## 2012-05-10 VITALS — Ht 66.0 in | Wt 194.0 lb

## 2012-05-10 DIAGNOSIS — F8189 Other developmental disorders of scholastic skills: Secondary | ICD-10-CM

## 2012-05-10 DIAGNOSIS — IMO0002 Reserved for concepts with insufficient information to code with codable children: Secondary | ICD-10-CM

## 2012-05-10 DIAGNOSIS — I10 Essential (primary) hypertension: Secondary | ICD-10-CM

## 2012-05-10 DIAGNOSIS — IMO0001 Reserved for inherently not codable concepts without codable children: Secondary | ICD-10-CM | POA: Diagnosis not present

## 2012-05-10 DIAGNOSIS — E1165 Type 2 diabetes mellitus with hyperglycemia: Secondary | ICD-10-CM

## 2012-05-10 LAB — POCT GLYCOSYLATED HEMOGLOBIN (HGB A1C): Hemoglobin A1C: 12.7

## 2012-05-10 NOTE — Assessment & Plan Note (Signed)
I had a long discussion with [name] about her DM.  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  She says that she does not have "sugar" and that it is all from the prednisone and when they take her off the prednisone her sugar will be fine.  I told her that her A1C has been >10 for more than two years now and that I thought she needed to see a Diabetes specialist, an endocrinologist.  Patient at first asked "what's that."  Then she became angry with me and told me I was a bad doctor.  I offered the referral but then patient said to me that "something bad was going to happen to you."

## 2012-05-10 NOTE — Assessment & Plan Note (Addendum)
+/-   personality disorder  -patient stated to Dr. Adrian Blackwater, who tried to speak to her about her disrespectful tone while patient was waiting that she was going to find a new doctor.  - at the end of the visit with me, after I told her I felt her diabetes needed to be managed by a specialist and I would refer her, she became upset, told me that I was a bad doctor, we have never had a good report, and she would find a new doctor.  On her way out, she said "something bad is going to happen to you."

## 2012-05-10 NOTE — Telephone Encounter (Signed)
Will forward to Dr. Chamberlain 

## 2012-05-10 NOTE — Progress Notes (Signed)
  Subjective:    Patient ID: [name], female    DOB: 09/20/1985, 27 y.o.   MRN: [id number]  HPI:  Patient comes in for diabetes follow up.  While being checked in, patient was argumentative and rude to clinical staff, refused blood pressure check, saying A1C had to be done first, complaining about waiting, talking in a nasty tone to clinic staff.  Another MD asked her not to speak to clinic staff that way, and patient said she was going to find a new doctor.   I spoke to patient and she says she does not feel like she was being rude at all.  She says that she is irritated because there was no where in the waiting room to sit, and we need to expand our waiting room.  She also says that she thinks we are racist because everyone in the waiting room is black, and we must schedule "black" clinic days because there are no white people or [place] people in the waiting room.  Please note race descriptions are hers words, not mine.    Discussed DM, patient forgot her log book.  Says fasting sugars are around 150.  She says she is taking lantus 100 units daily.  She stopped taking novolog because it causes cramping, and she read online that it causes cramping.  She occasionally takes Levemir in place of novolog but has read that it is a long-acting insulin.    HTN- taking HCTZ and lisinopril.   Past Medical History  Diagnosis Date  . Chronic kidney disease   . Hypertension   . Hyperlipidemia   . Diabetes mellitus     Pt reports diagnosis in June 2011    History  Substance Use Topics  . Smoking status: Never Smoker   . Smokeless tobacco: Never Used  . Alcohol Use: No    Family History  Problem Relation Age of Onset  . Arthritis Mother   . Diabetes Mother   . Hypertension Mother      ROS Pertinent items in HPI    Objective:  Physical Exam:  Ht 5\' 6"  (1.676 m)  Wt 194 lb (87.998 kg)  BMI 31.33 kg/m2 General appearance: Head: Normocephalic, without obvious abnormality,  atraumatic Lungs: clear to auscultation bilaterally Heart: regular rate and rhythm, S1, S2 normal, no murmur, click, rub or gallop Pulses: 2+ and symmetric       Assessment & Plan:

## 2012-05-10 NOTE — Assessment & Plan Note (Signed)
I checked Blood pressure manually, it was 132/72.  Continue HCTZ.

## 2012-05-10 NOTE — Patient Instructions (Signed)
It was good to see you today.  Your Hemoglobin A1C is  Lab Results  Component Value Date   HGBA1C 12.7 05/10/2012  .  Remember your goal for A1C is less than 7.  Your goal for fasting morning blood sugar is 80-120.   I am referring you to see an Endocrinologist, a diabetes specialist.  Remember you should only take Lantus, do not take Levemir with it.   You also need to re-schedule an appointment with your eye doctor.

## 2012-05-10 NOTE — Telephone Encounter (Signed)
Patient forgot to request refills on all of her diabetes meds and the insulin syringes sent to [facility] on [address].  She actually needs all of her meds that were going to [facility] to go to [facility].  She was getting them from [facility] but most of the time, they would have to order her meds so she wouldn't get them when she needed them.

## 2012-05-11 NOTE — Telephone Encounter (Signed)
Patient called back to ask for refills after storming out of her office visit where she told me I was a bad doctor and she was going to get a new doctor.   Patient with inappropriate behavior yesterday, cursing at nurses, threatened me personally "something bad is going to happen to you." I will not refill medications until final decision from Elray Mcgregor and Dr. Erin Hearing regarding patient's status at our clinic.

## 2012-05-12 ENCOUNTER — Telehealth: Payer: Self-pay | Admitting: *Deleted

## 2012-05-12 NOTE — Addendum Note (Signed)
Addended by: Sammuel Hines on: 05/12/2012 08:37 AM   Modules accepted: Level of Service, SmartSet

## 2012-05-12 NOTE — Progress Notes (Signed)
This encounter was created in error - please disregard.

## 2012-05-12 NOTE — Telephone Encounter (Signed)
Incident of rude and inappropriate behavior was shared with me the day it occurred.  I called patient yesterday to discuss.  Patient denied any responsibility for her behavior, accused our staff of being rude to her.  Stated that the waiting room was full so she had to stand, was upset about the weight that was recorded on her, was upset that her BP was taken when she asked that it not be taken.  Also stated that she was rushed through the appointment and always feels rushed when she comes in.  When I questioned her about the comment made to Dr. Barbra Sarks "something bad is going to happen to you" patient's mother then got on the phone and began loudly complaining about our clinic.  This went on for about 20 minutes.  I apologized for their perceived bad treatment and offered to change their PCPs.  They declined and said that they will never come back here again.  They are going to find a new doctor.  I gave them my direct number and told them to let me know once they have established with another practice and I would take them out of our system.  Until then I am going to re-assign both of them to Dr. Adrian Blackwater, who is agreeable to this, given that Dr. Barbra Sarks is now uncomfortable interacting with them.

## 2012-05-16 ENCOUNTER — Encounter (HOSPITAL_COMMUNITY): Payer: Medicare Other

## 2012-05-16 ENCOUNTER — Encounter (HOSPITAL_COMMUNITY)
Admission: RE | Admit: 2012-05-16 | Discharge: 2012-05-16 | Disposition: A | Discharge status: Home / Self Care | Payer: Medicare Other | Source: Ambulatory Visit | Attending: Nephrology | Admitting: Nephrology

## 2012-05-16 DIAGNOSIS — D638 Anemia in other chronic diseases classified elsewhere: Secondary | ICD-10-CM | POA: Insufficient documentation

## 2012-05-16 DIAGNOSIS — N183 Chronic kidney disease, stage 3 unspecified: Secondary | ICD-10-CM | POA: Insufficient documentation

## 2012-05-16 LAB — IRON AND TIBC
Iron: 47 ug/dL (ref 42–135)
Saturation Ratios: 17 % — ABNORMAL LOW (ref 20–55)
TIBC: 271 ug/dL (ref 250–470)

## 2012-05-16 LAB — POCT HEMOGLOBIN-HEMACUE: Hemoglobin: 10.9 g/dL — ABNORMAL LOW (ref 12.0–15.0)

## 2012-05-16 MED ORDER — EPOETIN ALFA 40000 UNIT/ML IJ SOLN: 25000.0000 [IU] | INTRAMUSCULAR | Status: DC

## 2012-05-16 MED ORDER — EPOETIN ALFA 20000 UNIT/ML IJ SOLN
INTRAMUSCULAR | Status: AC
  Administered 2012-05-16: 20000 [IU] via SUBCUTANEOUS
  Filled 2012-05-16: qty 1

## 2012-05-16 MED ORDER — EPOETIN ALFA 10000 UNIT/ML IJ SOLN
INTRAMUSCULAR | Status: AC
  Administered 2012-05-16: 5000 [IU] via SUBCUTANEOUS
  Filled 2012-05-16: qty 1

## 2012-05-24 DIAGNOSIS — R7989 Other specified abnormal findings of blood chemistry: Secondary | ICD-10-CM | POA: Diagnosis not present

## 2012-05-24 DIAGNOSIS — Z79899 Other long term (current) drug therapy: Secondary | ICD-10-CM | POA: Diagnosis not present

## 2012-05-24 DIAGNOSIS — Z940 Kidney transplant status: Secondary | ICD-10-CM | POA: Diagnosis not present

## 2012-05-24 DIAGNOSIS — D8989 Other specified disorders involving the immune mechanism, not elsewhere classified: Secondary | ICD-10-CM | POA: Diagnosis not present

## 2012-05-30 ENCOUNTER — Encounter (HOSPITAL_COMMUNITY): Payer: Medicare Other

## 2012-05-30 ENCOUNTER — Encounter (HOSPITAL_COMMUNITY)
Admission: RE | Admit: 2012-05-30 | Discharge: 2012-05-30 | Disposition: A | Discharge status: Home / Self Care | Payer: Medicare Other | Source: Ambulatory Visit | Attending: Nephrology | Admitting: Nephrology

## 2012-05-30 DIAGNOSIS — N183 Chronic kidney disease, stage 3 unspecified: Secondary | ICD-10-CM | POA: Diagnosis not present

## 2012-05-30 DIAGNOSIS — D638 Anemia in other chronic diseases classified elsewhere: Secondary | ICD-10-CM | POA: Diagnosis not present

## 2012-05-30 LAB — POCT HEMOGLOBIN-HEMACUE: Hemoglobin: 11 g/dL — ABNORMAL LOW (ref 12.0–15.0)

## 2012-05-30 MED ORDER — EPOETIN ALFA 40000 UNIT/ML IJ SOLN: 25000.0000 [IU] | INTRAMUSCULAR | Status: DC

## 2012-05-30 MED ORDER — EPOETIN ALFA 10000 UNIT/ML IJ SOLN
INTRAMUSCULAR | Status: AC
  Administered 2012-05-30: 10000 [IU] via SUBCUTANEOUS
  Filled 2012-05-30: qty 1

## 2012-05-30 MED ORDER — EPOETIN ALFA 20000 UNIT/ML IJ SOLN
INTRAMUSCULAR | Status: AC
  Administered 2012-05-30: 20000 [IU] via SUBCUTANEOUS
  Filled 2012-05-30: qty 1

## 2012-05-31 ENCOUNTER — Encounter: Payer: Self-pay | Admitting: *Deleted

## 2012-06-13 ENCOUNTER — Encounter (HOSPITAL_COMMUNITY)
Admission: RE | Admit: 2012-06-13 | Discharge: 2012-06-13 | Disposition: A | Discharge status: Home / Self Care | Payer: Medicare Other | Source: Ambulatory Visit | Attending: Nephrology | Admitting: Nephrology

## 2012-06-13 DIAGNOSIS — Z79899 Other long term (current) drug therapy: Secondary | ICD-10-CM | POA: Diagnosis not present

## 2012-06-13 DIAGNOSIS — N183 Chronic kidney disease, stage 3 unspecified: Secondary | ICD-10-CM | POA: Insufficient documentation

## 2012-06-13 DIAGNOSIS — D8989 Other specified disorders involving the immune mechanism, not elsewhere classified: Secondary | ICD-10-CM | POA: Diagnosis not present

## 2012-06-13 DIAGNOSIS — D638 Anemia in other chronic diseases classified elsewhere: Secondary | ICD-10-CM | POA: Diagnosis not present

## 2012-06-13 DIAGNOSIS — R7989 Other specified abnormal findings of blood chemistry: Secondary | ICD-10-CM | POA: Diagnosis not present

## 2012-06-13 DIAGNOSIS — Z940 Kidney transplant status: Secondary | ICD-10-CM | POA: Diagnosis not present

## 2012-06-13 LAB — IRON AND TIBC
Saturation Ratios: 20 % (ref 20–55)
UIBC: 227 ug/dL (ref 125–400)

## 2012-06-13 MED ORDER — EPOETIN ALFA 20000 UNIT/ML IJ SOLN
INTRAMUSCULAR | Status: AC
  Administered 2012-06-13: 20000 [IU] via SUBCUTANEOUS
  Filled 2012-06-13: qty 1

## 2012-06-13 MED ORDER — EPOETIN ALFA 40000 UNIT/ML IJ SOLN: 25000.0000 [IU] | INTRAMUSCULAR | Status: DC

## 2012-06-13 MED ORDER — EPOETIN ALFA 10000 UNIT/ML IJ SOLN
INTRAMUSCULAR | Status: AC
  Administered 2012-06-13: 5000 [IU] via SUBCUTANEOUS
  Filled 2012-06-13: qty 1

## 2012-06-15 DIAGNOSIS — I10 Essential (primary) hypertension: Secondary | ICD-10-CM | POA: Diagnosis not present

## 2012-06-15 DIAGNOSIS — N289 Disorder of kidney and ureter, unspecified: Secondary | ICD-10-CM | POA: Diagnosis not present

## 2012-06-15 DIAGNOSIS — E781 Pure hyperglyceridemia: Secondary | ICD-10-CM | POA: Diagnosis not present

## 2012-06-15 DIAGNOSIS — E559 Vitamin D deficiency, unspecified: Secondary | ICD-10-CM | POA: Diagnosis not present

## 2012-06-15 DIAGNOSIS — E7800 Pure hypercholesterolemia, unspecified: Secondary | ICD-10-CM | POA: Diagnosis not present

## 2012-06-15 DIAGNOSIS — R0602 Shortness of breath: Secondary | ICD-10-CM | POA: Diagnosis not present

## 2012-06-15 DIAGNOSIS — E1110 Type 2 diabetes mellitus with ketoacidosis without coma: Secondary | ICD-10-CM | POA: Diagnosis not present

## 2012-06-15 DIAGNOSIS — Z1329 Encounter for screening for other suspected endocrine disorder: Secondary | ICD-10-CM | POA: Diagnosis not present

## 2012-06-15 DIAGNOSIS — Z131 Encounter for screening for diabetes mellitus: Secondary | ICD-10-CM | POA: Diagnosis not present

## 2012-06-19 DIAGNOSIS — Z940 Kidney transplant status: Secondary | ICD-10-CM | POA: Diagnosis not present

## 2012-06-19 DIAGNOSIS — I10 Essential (primary) hypertension: Secondary | ICD-10-CM | POA: Diagnosis not present

## 2012-06-19 DIAGNOSIS — Z79899 Other long term (current) drug therapy: Secondary | ICD-10-CM | POA: Diagnosis not present

## 2012-06-20 DIAGNOSIS — I10 Essential (primary) hypertension: Secondary | ICD-10-CM | POA: Diagnosis not present

## 2012-06-20 DIAGNOSIS — E119 Type 2 diabetes mellitus without complications: Secondary | ICD-10-CM | POA: Diagnosis not present

## 2012-06-22 DIAGNOSIS — D8989 Other specified disorders involving the immune mechanism, not elsewhere classified: Secondary | ICD-10-CM | POA: Diagnosis not present

## 2012-06-22 DIAGNOSIS — R7989 Other specified abnormal findings of blood chemistry: Secondary | ICD-10-CM | POA: Diagnosis not present

## 2012-06-22 DIAGNOSIS — Z940 Kidney transplant status: Secondary | ICD-10-CM | POA: Diagnosis not present

## 2012-06-22 DIAGNOSIS — Z79899 Other long term (current) drug therapy: Secondary | ICD-10-CM | POA: Diagnosis not present

## 2012-06-27 ENCOUNTER — Encounter (HOSPITAL_COMMUNITY)
Admission: RE | Admit: 2012-06-27 | Discharge: 2012-06-27 | Disposition: A | Discharge status: Home / Self Care | Payer: Medicare Other | Source: Ambulatory Visit | Attending: Nephrology | Admitting: Nephrology

## 2012-06-27 MED ORDER — EPOETIN ALFA 40000 UNIT/ML IJ SOLN: 25000.0000 [IU] | INTRAMUSCULAR | Status: DC

## 2012-06-27 MED ORDER — EPOETIN ALFA 10000 UNIT/ML IJ SOLN
INTRAMUSCULAR | Status: AC
  Administered 2012-06-27: 5000 [IU] via SUBCUTANEOUS
  Filled 2012-06-27: qty 1

## 2012-06-27 MED ORDER — EPOETIN ALFA 20000 UNIT/ML IJ SOLN
INTRAMUSCULAR | Status: AC
  Administered 2012-06-27: 20000 [IU] via SUBCUTANEOUS
  Filled 2012-06-27: qty 1

## 2012-07-01 ENCOUNTER — Encounter (HOSPITAL_COMMUNITY): Payer: Self-pay | Admitting: Physical Medicine and Rehabilitation

## 2012-07-01 ENCOUNTER — Telehealth: Payer: Self-pay | Admitting: Family Medicine

## 2012-07-01 ENCOUNTER — Emergency Department (HOSPITAL_COMMUNITY)
Admission: EM | Admit: 2012-07-01 | Discharge: 2012-07-01 | Disposition: A | Discharge status: Home / Self Care | Payer: Medicare Other | Attending: Emergency Medicine | Admitting: Emergency Medicine

## 2012-07-01 DIAGNOSIS — H9319 Tinnitus, unspecified ear: Secondary | ICD-10-CM | POA: Diagnosis not present

## 2012-07-01 DIAGNOSIS — Z794 Long term (current) use of insulin: Secondary | ICD-10-CM | POA: Insufficient documentation

## 2012-07-01 DIAGNOSIS — K0889 Other specified disorders of teeth and supporting structures: Secondary | ICD-10-CM

## 2012-07-01 DIAGNOSIS — E119 Type 2 diabetes mellitus without complications: Secondary | ICD-10-CM | POA: Insufficient documentation

## 2012-07-01 DIAGNOSIS — R51 Headache: Secondary | ICD-10-CM | POA: Diagnosis not present

## 2012-07-01 DIAGNOSIS — N189 Chronic kidney disease, unspecified: Secondary | ICD-10-CM | POA: Insufficient documentation

## 2012-07-01 DIAGNOSIS — I129 Hypertensive chronic kidney disease with stage 1 through stage 4 chronic kidney disease, or unspecified chronic kidney disease: Secondary | ICD-10-CM | POA: Diagnosis not present

## 2012-07-01 DIAGNOSIS — Z940 Kidney transplant status: Secondary | ICD-10-CM | POA: Insufficient documentation

## 2012-07-01 DIAGNOSIS — Z79899 Other long term (current) drug therapy: Secondary | ICD-10-CM | POA: Insufficient documentation

## 2012-07-01 DIAGNOSIS — E785 Hyperlipidemia, unspecified: Secondary | ICD-10-CM | POA: Diagnosis not present

## 2012-07-01 DIAGNOSIS — H9209 Otalgia, unspecified ear: Secondary | ICD-10-CM | POA: Insufficient documentation

## 2012-07-01 DIAGNOSIS — K089 Disorder of teeth and supporting structures, unspecified: Secondary | ICD-10-CM | POA: Insufficient documentation

## 2012-07-01 MED ORDER — HYDROCODONE-ACETAMINOPHEN 5-325 MG PO TABS: 2.0000 | ORAL_TABLET | ORAL | Status: DC | PRN

## 2012-07-01 MED ORDER — HYDROCODONE-ACETAMINOPHEN 5-325 MG PO TABS
1.0000 | ORAL_TABLET | Freq: Once | ORAL | Status: AC
  Administered 2012-07-01: 1 via ORAL
  Filled 2012-07-01: qty 1

## 2012-07-01 MED ORDER — PENICILLIN V POTASSIUM 250 MG PO TABS
500.0000 mg | ORAL_TABLET | Freq: Once | ORAL | Status: AC
  Administered 2012-07-01: 500 mg via ORAL
  Filled 2012-07-01: qty 2

## 2012-07-01 MED ORDER — PENICILLIN V POTASSIUM 500 MG PO TABS: 500.0000 mg | ORAL_TABLET | Freq: Three times a day (TID) | ORAL | Status: DC

## 2012-07-01 NOTE — ED Notes (Signed)
Pt presents to department for evaluation of L lower molar pain. Ongoing since Friday morning. 8/10 pain at the time. Respirations unlabored. No signs of acute distress noted.

## 2012-07-01 NOTE — Telephone Encounter (Signed)
EMERGENCY LINE:  Pt called with complaint of tooth pain. She has an appointment in May with the dentist to have tooth pulled. She is having a lot of pain, to her head and around her left eye. She states she has been using oragel and Tylenol which has not helped. (Cannot take ibuprofen due to renal transplant.) The pain is getting worse and she does not know what to do.  I am not able to evaluate her fully over the phone. If the OTC medication is not helping, I recommend she come to the ED today for further evaluation to see if she has an abscess or some type of infection around the tooth. Pt agrees with this plan.  Celeste Candelas M. Arianni Gallego, M.D. 07/01/2012 8:55 AM

## 2012-07-01 NOTE — ED Provider Notes (Signed)
History     CSN: [id number]  Arrival date & time 07/01/12  1041   First MD Initiated Contact with Patient 07/01/12 1118      Chief Complaint  Patient presents with  . Dental Pain    (Consider location/radiation/quality/duration/timing/severity/associated sxs/prior treatment) HPI  27 year old female with hx of diabetes presents complaining of dental pain. Patient reports for the past 2-3 days she has gradual onset of pain to her left lower tooth. Describe pain as a sharp throbbing sensation worsening with cold air or cold water. Pain has radiates through the left side of face, causing ringing in her ear. Pain is 8/10 unrelieved with taking over-the-counter medication. She reports having seen a toothache the same tooth in the past. She denies fever, chills, neck stiffness, dizziness, lightheadedness, chest pain, shortness of breath, or rash. No recent trauma.  Past Medical History  Diagnosis Date  . Chronic kidney disease   . Hypertension   . Hyperlipidemia   . Diabetes mellitus     Pt reports diagnosis in June 2011    Past Surgical History  Procedure Laterality Date  . Kidney transplant      Family History  Problem Relation Age of Onset  . Arthritis Mother   . Diabetes Mother   . Hypertension Mother     History  Substance Use Topics  . Smoking status: Never Smoker   . Smokeless tobacco: Never Used  . Alcohol Use: No    OB History   Grav Para Term Preterm Abortions TAB SAB Ect Mult Living                  Review of Systems  HENT: Positive for ear pain, dental problem and tinnitus. Negative for rhinorrhea, sneezing, trouble swallowing, voice change and ear discharge.   Skin: Negative for rash and wound.  Neurological: Positive for headaches.    Allergies  Motrin; Diphenhydramine hcl; and Iron dextran  Home Medications   Current Outpatient Rx  Name  Route  Sig  Dispense  Refill  . ACCU-CHEK FASTCLIX LANCETS MISC   Does not apply   1 Device by Does not  apply route 3 (three) times daily.   1 each   11   . B-D INS SYRINGE 0.5CC/31GX5/16 31G X 5/16" 0.5 ML MISC      USE AS DIRECTED   100 each   5     Rx has expired - unused refills remain   . calcium carbonate (TUMS) 500 MG chewable tablet   Oral   Chew 2 tablets by mouth daily.           Marland Kitchen epoetin alfa (PROCRIT) 29562 UNIT/ML injection   Subcutaneous   Inject 30,000 Units into the skin. twice monthly-at short stay          . Glucose Blood (ASCENSIA AUTODISC TEST) DISK      Test blood sugar four times daily          . hydrOXYzine (ATARAX) 25 MG tablet   Oral   Take 25 mg by mouth 2 (two) times daily. for itiching          . Insulin Pen Needle (BD PEN NEEDLE NANO U/F) 32G X 4 MM MISC      Please give syrnges that hold 100 units.  Check blood sugars twice daily.   100 each   5   . Insulin Syringe-Needle U-100 30G X 1/2" 0.5 ML MISC      Supply quantity for three shots per  day dosing   1 each   11     Please give BD Brand.   . Insulin Syringes, Disposable, U-100 1 ML MISC      BD Insulin syringe 1 mL, 31 gauge, 6 mm needle   90 each   11   . lisinopril-hydrochlorothiazide (PRINZIDE,ZESTORETIC) 20-25 MG per tablet   Oral   Take 0.5 tablets by mouth daily.         Marland Kitchen loperamide (IMODIUM) 2 MG capsule      TAKE 2 CAPSULES BY MOUTH FOR 1 DOSE, THEN TAKE 1 CAPSULE BY MOUTH AFTER EACH LOOSE STOOL--MAX OF 8 CAPSULES PER DAY   30 capsule   3   . ondansetron (ZOFRAN) 4 MG tablet   Oral   Take 1 tablet (4 mg total) by mouth every 12 (twelve) hours as needed for nausea.   30 tablet   1   . predniSONE (DELTASONE) 5 MG tablet   Oral   Take 5 mg by mouth at bedtime.           . ranitidine (ZANTAC) 150 MG tablet      TAKE ONE TABLET BY MOUTH DAILY AS NEEDED   30 tablet   11     Rx has expired - no refills remain   . simvastatin (ZOCOR) 20 MG tablet   Oral   Take 1 tablet (20 mg total) by mouth at bedtime.   30 tablet   11   .  sulfamethoxazole-trimethoprim (BACTRIM,SEPTRA) 400-80 MG per tablet   Oral   Take 1 tablet by mouth daily. Per kidney doctor   30 tablet   11   . sulfamethoxazole-trimethoprim (BACTRIM,SEPTRA) 400-80 MG per tablet      TAKE 1 TABLET BY MOUTH DAILY PER KIDNEY DOCTOR   30 tablet   10     Rx has expired - unused refills remain   . tacrolimus (PROGRAF) 1 MG capsule   Oral   Take 2.5 mg by mouth 2 (two) times daily. 2  1mg  tabs and one 0.5 tabs twice a day           BP 133/101  Pulse 86  Temp(Src) 98 F (36.7 C) (Oral)  Resp 18  SpO2 100%  Physical Exam  Nursing note and vitals reviewed. Constitutional: She appears well-developed and well-nourished. No distress.  HENT:  Head: Normocephalic and atraumatic.  Right Ear: External ear normal.  Left Ear: External ear normal.  Nose: Nose normal.  Mouth/Throat: Oropharynx is clear and moist. No oropharyngeal exudate.    Eyes: Conjunctivae are normal.  Neck: Normal range of motion. Neck supple.  Cardiovascular: Normal rate and regular rhythm.   Pulmonary/Chest: Effort normal and breath sounds normal.  Lymphadenopathy:    She has no cervical adenopathy.  Neurological: She is alert.  Skin: Skin is warm. No rash noted.  Psychiatric: She has a normal mood and affect.    ED Course  Procedures (including critical care time)  11:58 AM Dental pain to L lower 2nd molar.  Will give abx, pain med and dentist referral.    Labs Reviewed - No data to display No results found.   1. Pain, dental       MDM  BP 133/101  Pulse 86  Temp(Src) 98 F (36.7 C) (Oral)  Resp 18  SpO2 100%  I have reviewed nursing notes and vital signs.  I reviewed available ER/hospitalization records thought the EMR         Domenic Moras, PA-C  07/01/12 1234 

## 2012-07-06 NOTE — ED Provider Notes (Signed)
Medical screening examination/treatment/procedure(s) were performed by non-physician practitioner and as supervising physician I was immediately available for consultation/collaboration.  Babette Relic, MD 07/06/12 [phone]

## 2012-07-10 DIAGNOSIS — Z940 Kidney transplant status: Secondary | ICD-10-CM | POA: Diagnosis not present

## 2012-07-10 DIAGNOSIS — I10 Essential (primary) hypertension: Secondary | ICD-10-CM | POA: Diagnosis not present

## 2012-07-10 DIAGNOSIS — IMO0001 Reserved for inherently not codable concepts without codable children: Secondary | ICD-10-CM | POA: Diagnosis not present

## 2012-07-11 ENCOUNTER — Encounter (HOSPITAL_COMMUNITY)
Admission: RE | Admit: 2012-07-11 | Discharge: 2012-07-11 | Disposition: A | Discharge status: Home / Self Care | Payer: Medicare Other | Source: Ambulatory Visit | Attending: Nephrology | Admitting: Nephrology

## 2012-07-11 LAB — POCT HEMOGLOBIN-HEMACUE: Hemoglobin: 10.7 g/dL — ABNORMAL LOW (ref 12.0–15.0)

## 2012-07-11 LAB — IRON AND TIBC
Iron: 43 ug/dL (ref 42–135)
TIBC: 315 ug/dL (ref 250–470)

## 2012-07-11 MED ORDER — EPOETIN ALFA 20000 UNIT/ML IJ SOLN
INTRAMUSCULAR | Status: AC
  Administered 2012-07-11: 20000 [IU]
  Filled 2012-07-11: qty 1

## 2012-07-11 MED ORDER — EPOETIN ALFA 40000 UNIT/ML IJ SOLN: 25000.0000 [IU] | INTRAMUSCULAR | Status: DC

## 2012-07-11 MED ORDER — EPOETIN ALFA 10000 UNIT/ML IJ SOLN
INTRAMUSCULAR | Status: AC
  Administered 2012-07-11: 5000 [IU]
  Filled 2012-07-11: qty 1

## 2012-07-12 DIAGNOSIS — I10 Essential (primary) hypertension: Secondary | ICD-10-CM | POA: Diagnosis not present

## 2012-07-12 DIAGNOSIS — E119 Type 2 diabetes mellitus without complications: Secondary | ICD-10-CM | POA: Diagnosis not present

## 2012-07-24 DIAGNOSIS — R7989 Other specified abnormal findings of blood chemistry: Secondary | ICD-10-CM | POA: Diagnosis not present

## 2012-07-24 DIAGNOSIS — Z79899 Other long term (current) drug therapy: Secondary | ICD-10-CM | POA: Diagnosis not present

## 2012-07-24 DIAGNOSIS — Z940 Kidney transplant status: Secondary | ICD-10-CM | POA: Diagnosis not present

## 2012-07-24 DIAGNOSIS — D8989 Other specified disorders involving the immune mechanism, not elsewhere classified: Secondary | ICD-10-CM | POA: Diagnosis not present

## 2012-07-25 ENCOUNTER — Encounter (HOSPITAL_COMMUNITY)
Admission: RE | Admit: 2012-07-25 | Discharge: 2012-07-25 | Disposition: A | Discharge status: Home / Self Care | Payer: Medicare Other | Source: Ambulatory Visit | Attending: Nephrology | Admitting: Nephrology

## 2012-07-25 DIAGNOSIS — N183 Chronic kidney disease, stage 3 unspecified: Secondary | ICD-10-CM | POA: Insufficient documentation

## 2012-07-25 DIAGNOSIS — D638 Anemia in other chronic diseases classified elsewhere: Secondary | ICD-10-CM | POA: Diagnosis not present

## 2012-07-25 MED ORDER — EPOETIN ALFA 20000 UNIT/ML IJ SOLN
INTRAMUSCULAR | Status: AC
  Administered 2012-07-25: 20000 [IU] via SUBCUTANEOUS
  Filled 2012-07-25: qty 1

## 2012-07-25 MED ORDER — EPOETIN ALFA 40000 UNIT/ML IJ SOLN: 25000.0000 [IU] | INTRAMUSCULAR | Status: DC

## 2012-07-25 MED ORDER — EPOETIN ALFA 10000 UNIT/ML IJ SOLN
INTRAMUSCULAR | Status: AC
  Administered 2012-07-25: 5000 [IU] via SUBCUTANEOUS
  Filled 2012-07-25: qty 1

## 2012-08-03 DIAGNOSIS — I10 Essential (primary) hypertension: Secondary | ICD-10-CM | POA: Diagnosis not present

## 2012-08-03 DIAGNOSIS — E119 Type 2 diabetes mellitus without complications: Secondary | ICD-10-CM | POA: Diagnosis not present

## 2012-08-04 ENCOUNTER — Other Ambulatory Visit (HOSPITAL_COMMUNITY): Payer: Self-pay | Admitting: *Deleted

## 2012-08-08 ENCOUNTER — Encounter (HOSPITAL_COMMUNITY)
Admission: RE | Admit: 2012-08-08 | Discharge: 2012-08-08 | Disposition: A | Discharge status: Home / Self Care | Payer: Medicare Other | Source: Ambulatory Visit | Attending: Nephrology | Admitting: Nephrology

## 2012-08-08 LAB — IRON AND TIBC
Iron: 68 ug/dL (ref 42–135)
Saturation Ratios: 24 % (ref 20–55)
UIBC: 213 ug/dL (ref 125–400)

## 2012-08-08 MED ORDER — EPOETIN ALFA 10000 UNIT/ML IJ SOLN
INTRAMUSCULAR | Status: AC
  Filled 2012-08-08: qty 1

## 2012-08-08 MED ORDER — EPOETIN ALFA 20000 UNIT/ML IJ SOLN
INTRAMUSCULAR | Status: AC
  Filled 2012-08-08: qty 1

## 2012-08-08 MED ORDER — EPOETIN ALFA 40000 UNIT/ML IJ SOLN
25000.0000 [IU] | INTRAMUSCULAR | Status: DC
  Administered 2012-08-08: 25000 [IU] via SUBCUTANEOUS

## 2012-08-09 MED FILL — Epoetin Alfa Inj 20000 Unit/ML: INTRAMUSCULAR | Qty: 1 | Status: AC

## 2012-08-09 MED FILL — Epoetin Alfa Inj 10000 Unit/ML: INTRAMUSCULAR | Qty: 1 | Status: AC

## 2012-08-22 ENCOUNTER — Encounter (HOSPITAL_COMMUNITY)
Admission: RE | Admit: 2012-08-22 | Discharge: 2012-08-22 | Disposition: A | Discharge status: Home / Self Care | Payer: Medicare Other | Source: Ambulatory Visit | Attending: Nephrology | Admitting: Nephrology

## 2012-08-22 DIAGNOSIS — Z79899 Other long term (current) drug therapy: Secondary | ICD-10-CM | POA: Diagnosis not present

## 2012-08-22 DIAGNOSIS — N183 Chronic kidney disease, stage 3 unspecified: Secondary | ICD-10-CM | POA: Diagnosis not present

## 2012-08-22 DIAGNOSIS — D638 Anemia in other chronic diseases classified elsewhere: Secondary | ICD-10-CM | POA: Insufficient documentation

## 2012-08-22 DIAGNOSIS — R7989 Other specified abnormal findings of blood chemistry: Secondary | ICD-10-CM | POA: Diagnosis not present

## 2012-08-22 DIAGNOSIS — Z940 Kidney transplant status: Secondary | ICD-10-CM | POA: Diagnosis not present

## 2012-08-22 DIAGNOSIS — D8989 Other specified disorders involving the immune mechanism, not elsewhere classified: Secondary | ICD-10-CM | POA: Diagnosis not present

## 2012-08-22 MED ORDER — EPOETIN ALFA 40000 UNIT/ML IJ SOLN: 25000.0000 [IU] | INTRAMUSCULAR | Status: DC

## 2012-09-05 ENCOUNTER — Encounter (HOSPITAL_COMMUNITY)
Admission: RE | Admit: 2012-09-05 | Discharge: 2012-09-05 | Disposition: A | Discharge status: Home / Self Care | Payer: Medicare Other | Source: Ambulatory Visit | Attending: Nephrology | Admitting: Nephrology

## 2012-09-05 LAB — POCT HEMOGLOBIN-HEMACUE: Hemoglobin: 11 g/dL — ABNORMAL LOW (ref 12.0–15.0)

## 2012-09-05 LAB — IRON AND TIBC
Iron: 61 ug/dL (ref 42–135)
Saturation Ratios: 26 % (ref 20–55)
TIBC: 238 ug/dL — ABNORMAL LOW (ref 250–470)
UIBC: 177 ug/dL (ref 125–400)

## 2012-09-05 MED ORDER — EPOETIN ALFA 40000 UNIT/ML IJ SOLN: 25000.0000 [IU] | INTRAMUSCULAR | Status: DC

## 2012-09-05 MED ORDER — EPOETIN ALFA 20000 UNIT/ML IJ SOLN
INTRAMUSCULAR | Status: AC
  Administered 2012-09-05: 20000 [IU]
  Filled 2012-09-05: qty 1

## 2012-09-05 MED ORDER — EPOETIN ALFA 10000 UNIT/ML IJ SOLN
INTRAMUSCULAR | Status: AC
  Administered 2012-09-05: 5000 [IU]
  Filled 2012-09-05: qty 1

## 2012-09-07 DIAGNOSIS — R7989 Other specified abnormal findings of blood chemistry: Secondary | ICD-10-CM | POA: Diagnosis not present

## 2012-09-07 DIAGNOSIS — Z940 Kidney transplant status: Secondary | ICD-10-CM | POA: Diagnosis not present

## 2012-09-07 DIAGNOSIS — D8989 Other specified disorders involving the immune mechanism, not elsewhere classified: Secondary | ICD-10-CM | POA: Diagnosis not present

## 2012-09-07 DIAGNOSIS — Z79899 Other long term (current) drug therapy: Secondary | ICD-10-CM | POA: Diagnosis not present

## 2012-09-19 ENCOUNTER — Encounter (HOSPITAL_COMMUNITY)
Admission: RE | Admit: 2012-09-19 | Discharge: 2012-09-19 | Disposition: A | Discharge status: Home / Self Care | Payer: Medicare Other | Source: Ambulatory Visit | Attending: Nephrology | Admitting: Nephrology

## 2012-09-19 DIAGNOSIS — D638 Anemia in other chronic diseases classified elsewhere: Secondary | ICD-10-CM | POA: Insufficient documentation

## 2012-09-19 DIAGNOSIS — N183 Chronic kidney disease, stage 3 unspecified: Secondary | ICD-10-CM | POA: Insufficient documentation

## 2012-09-19 LAB — POCT HEMOGLOBIN-HEMACUE: Hemoglobin: 10.3 g/dL — ABNORMAL LOW (ref 12.0–15.0)

## 2012-09-19 MED ORDER — EPOETIN ALFA 10000 UNIT/ML IJ SOLN
INTRAMUSCULAR | Status: AC
  Administered 2012-09-19: 10000 [IU] via SUBCUTANEOUS
  Filled 2012-09-19: qty 1

## 2012-09-19 MED ORDER — EPOETIN ALFA 40000 UNIT/ML IJ SOLN: 25000.0000 [IU] | INTRAMUSCULAR | Status: DC

## 2012-09-19 MED ORDER — EPOETIN ALFA 20000 UNIT/ML IJ SOLN
INTRAMUSCULAR | Status: AC
  Administered 2012-09-19: 20000 [IU] via SUBCUTANEOUS
  Filled 2012-09-19: qty 1

## 2012-10-03 ENCOUNTER — Encounter (HOSPITAL_COMMUNITY)
Admission: RE | Admit: 2012-10-03 | Discharge: 2012-10-03 | Disposition: A | Discharge status: Home / Self Care | Payer: Medicare Other | Source: Ambulatory Visit | Attending: Nephrology | Admitting: Nephrology

## 2012-10-03 DIAGNOSIS — Z940 Kidney transplant status: Secondary | ICD-10-CM | POA: Diagnosis not present

## 2012-10-03 DIAGNOSIS — Z79899 Other long term (current) drug therapy: Secondary | ICD-10-CM | POA: Diagnosis not present

## 2012-10-03 DIAGNOSIS — R7989 Other specified abnormal findings of blood chemistry: Secondary | ICD-10-CM | POA: Diagnosis not present

## 2012-10-03 LAB — IRON AND TIBC
Saturation Ratios: 20 % (ref 20–55)
TIBC: 255 ug/dL (ref 250–470)

## 2012-10-03 MED ORDER — EPOETIN ALFA 40000 UNIT/ML IJ SOLN: 25000.0000 [IU] | INTRAMUSCULAR | Status: DC

## 2012-10-03 MED ORDER — EPOETIN ALFA 20000 UNIT/ML IJ SOLN
INTRAMUSCULAR | Status: AC
  Administered 2012-10-03: 20000 [IU] via SUBCUTANEOUS
  Filled 2012-10-03: qty 1

## 2012-10-03 MED ORDER — EPOETIN ALFA 10000 UNIT/ML IJ SOLN
INTRAMUSCULAR | Status: AC
  Administered 2012-10-03: 10000 [IU] via SUBCUTANEOUS
  Filled 2012-10-03: qty 1

## 2012-10-04 DIAGNOSIS — E119 Type 2 diabetes mellitus without complications: Secondary | ICD-10-CM | POA: Diagnosis not present

## 2012-10-04 DIAGNOSIS — G589 Mononeuropathy, unspecified: Secondary | ICD-10-CM | POA: Diagnosis not present

## 2012-10-04 DIAGNOSIS — E7800 Pure hypercholesterolemia, unspecified: Secondary | ICD-10-CM | POA: Diagnosis not present

## 2012-10-17 ENCOUNTER — Encounter (HOSPITAL_COMMUNITY)
Admission: RE | Admit: 2012-10-17 | Discharge: 2012-10-17 | Disposition: A | Discharge status: Home / Self Care | Payer: Medicare Other | Source: Ambulatory Visit | Attending: Nephrology | Admitting: Nephrology

## 2012-10-17 DIAGNOSIS — R7989 Other specified abnormal findings of blood chemistry: Secondary | ICD-10-CM | POA: Diagnosis not present

## 2012-10-17 DIAGNOSIS — N183 Chronic kidney disease, stage 3 unspecified: Secondary | ICD-10-CM | POA: Insufficient documentation

## 2012-10-17 DIAGNOSIS — Z940 Kidney transplant status: Secondary | ICD-10-CM | POA: Diagnosis not present

## 2012-10-17 DIAGNOSIS — D638 Anemia in other chronic diseases classified elsewhere: Secondary | ICD-10-CM | POA: Insufficient documentation

## 2012-10-17 DIAGNOSIS — Z79899 Other long term (current) drug therapy: Secondary | ICD-10-CM | POA: Diagnosis not present

## 2012-10-17 MED ORDER — EPOETIN ALFA 10000 UNIT/ML IJ SOLN
INTRAMUSCULAR | Status: AC
  Administered 2012-10-17: 5000 [IU] via SUBCUTANEOUS
  Filled 2012-10-17: qty 1

## 2012-10-17 MED ORDER — EPOETIN ALFA 20000 UNIT/ML IJ SOLN
INTRAMUSCULAR | Status: AC
  Administered 2012-10-17: 20000 [IU] via SUBCUTANEOUS
  Filled 2012-10-17: qty 1

## 2012-10-17 MED ORDER — EPOETIN ALFA 40000 UNIT/ML IJ SOLN: 25000.0000 [IU] | INTRAMUSCULAR | Status: DC

## 2012-10-26 DIAGNOSIS — I10 Essential (primary) hypertension: Secondary | ICD-10-CM | POA: Diagnosis not present

## 2012-10-26 DIAGNOSIS — E119 Type 2 diabetes mellitus without complications: Secondary | ICD-10-CM | POA: Diagnosis not present

## 2012-10-26 DIAGNOSIS — Z940 Kidney transplant status: Secondary | ICD-10-CM | POA: Diagnosis not present

## 2012-10-30 ENCOUNTER — Other Ambulatory Visit (HOSPITAL_COMMUNITY): Payer: Self-pay | Admitting: *Deleted

## 2012-10-31 ENCOUNTER — Encounter (HOSPITAL_COMMUNITY)
Admission: RE | Admit: 2012-10-31 | Discharge: 2012-10-31 | Disposition: A | Discharge status: Home / Self Care | Payer: Medicare Other | Source: Ambulatory Visit | Attending: Nephrology | Admitting: Nephrology

## 2012-10-31 LAB — IRON AND TIBC: UIBC: 186 ug/dL (ref 125–400)

## 2012-10-31 MED ORDER — EPOETIN ALFA 10000 UNIT/ML IJ SOLN
INTRAMUSCULAR | Status: AC
  Administered 2012-10-31: 5000 [IU] via SUBCUTANEOUS
  Filled 2012-10-31: qty 1

## 2012-10-31 MED ORDER — EPOETIN ALFA 40000 UNIT/ML IJ SOLN
25000.0000 [IU] | INTRAMUSCULAR | Status: DC
  Administered 2012-10-31: 20000 [IU] via SUBCUTANEOUS

## 2012-10-31 MED ORDER — EPOETIN ALFA 20000 UNIT/ML IJ SOLN
INTRAMUSCULAR | Status: AC
  Filled 2012-10-31: qty 1

## 2012-11-01 DIAGNOSIS — E119 Type 2 diabetes mellitus without complications: Secondary | ICD-10-CM | POA: Diagnosis not present

## 2012-11-01 DIAGNOSIS — E7800 Pure hypercholesterolemia, unspecified: Secondary | ICD-10-CM | POA: Diagnosis not present

## 2012-11-01 DIAGNOSIS — I10 Essential (primary) hypertension: Secondary | ICD-10-CM | POA: Diagnosis not present

## 2012-11-01 MED FILL — Epoetin Alfa Inj 20000 Unit/ML: INTRAMUSCULAR | Qty: 1 | Status: AC

## 2012-11-14 ENCOUNTER — Encounter (HOSPITAL_COMMUNITY)
Admission: RE | Admit: 2012-11-14 | Discharge: 2012-11-14 | Disposition: A | Discharge status: Home / Self Care | Payer: Medicare Other | Source: Ambulatory Visit | Attending: Nephrology | Admitting: Nephrology

## 2012-11-14 DIAGNOSIS — D638 Anemia in other chronic diseases classified elsewhere: Secondary | ICD-10-CM | POA: Diagnosis not present

## 2012-11-14 DIAGNOSIS — N183 Chronic kidney disease, stage 3 unspecified: Secondary | ICD-10-CM | POA: Diagnosis not present

## 2012-11-14 LAB — POCT HEMOGLOBIN-HEMACUE: Hemoglobin: 10.7 g/dL — ABNORMAL LOW (ref 12.0–15.0)

## 2012-11-14 MED ORDER — EPOETIN ALFA 10000 UNIT/ML IJ SOLN
INTRAMUSCULAR | Status: AC
  Administered 2012-11-14: 5000 [IU] via SUBCUTANEOUS
  Filled 2012-11-14: qty 1

## 2012-11-14 MED ORDER — EPOETIN ALFA 20000 UNIT/ML IJ SOLN
INTRAMUSCULAR | Status: AC
  Administered 2012-11-14: 20000 [IU] via SUBCUTANEOUS
  Filled 2012-11-14: qty 1

## 2012-11-14 MED ORDER — EPOETIN ALFA 40000 UNIT/ML IJ SOLN: 25000.0000 [IU] | INTRAMUSCULAR | Status: DC

## 2012-11-28 ENCOUNTER — Encounter (HOSPITAL_COMMUNITY)
Admission: RE | Admit: 2012-11-28 | Discharge: 2012-11-28 | Disposition: A | Discharge status: Home / Self Care | Payer: Medicare Other | Source: Ambulatory Visit | Attending: Nephrology | Admitting: Nephrology

## 2012-11-28 LAB — IRON AND TIBC: UIBC: 160 ug/dL (ref 125–400)

## 2012-11-28 MED ORDER — EPOETIN ALFA 10000 UNIT/ML IJ SOLN
INTRAMUSCULAR | Status: AC
  Administered 2012-11-28: 5000 [IU]
  Filled 2012-11-28: qty 1

## 2012-11-28 MED ORDER — EPOETIN ALFA 20000 UNIT/ML IJ SOLN
INTRAMUSCULAR | Status: AC
  Administered 2012-11-28: 20000 [IU]
  Filled 2012-11-28: qty 1

## 2012-11-28 MED ORDER — EPOETIN ALFA 40000 UNIT/ML IJ SOLN: 25000.0000 [IU] | INTRAMUSCULAR | Status: DC

## 2012-12-07 DIAGNOSIS — R7989 Other specified abnormal findings of blood chemistry: Secondary | ICD-10-CM | POA: Diagnosis not present

## 2012-12-07 DIAGNOSIS — Z940 Kidney transplant status: Secondary | ICD-10-CM | POA: Diagnosis not present

## 2012-12-07 DIAGNOSIS — Z79899 Other long term (current) drug therapy: Secondary | ICD-10-CM | POA: Diagnosis not present

## 2012-12-12 ENCOUNTER — Encounter (HOSPITAL_COMMUNITY)
Admission: RE | Admit: 2012-12-12 | Discharge: 2012-12-12 | Disposition: A | Discharge status: Home / Self Care | Payer: Medicare Other | Source: Ambulatory Visit | Attending: Nephrology | Admitting: Nephrology

## 2012-12-12 LAB — POCT HEMOGLOBIN-HEMACUE: Hemoglobin: 10.7 g/dL — ABNORMAL LOW (ref 12.0–15.0)

## 2012-12-12 MED ORDER — EPOETIN ALFA 40000 UNIT/ML IJ SOLN: 25000.0000 [IU] | INTRAMUSCULAR | Status: DC

## 2012-12-12 MED ORDER — EPOETIN ALFA 10000 UNIT/ML IJ SOLN
INTRAMUSCULAR | Status: AC
  Administered 2012-12-12: 5000 [IU] via SUBCUTANEOUS
  Filled 2012-12-12: qty 1

## 2012-12-12 MED ORDER — EPOETIN ALFA 20000 UNIT/ML IJ SOLN
INTRAMUSCULAR | Status: AC
  Administered 2012-12-12: 20000 [IU] via SUBCUTANEOUS
  Filled 2012-12-12: qty 1

## 2012-12-19 ENCOUNTER — Encounter (HOSPITAL_COMMUNITY): Payer: Medicare Other

## 2012-12-22 ENCOUNTER — Inpatient Hospital Stay (HOSPITAL_COMMUNITY)
Admission: EM | Admit: 2012-12-22 | Discharge: 2012-12-23 | DRG: 638 | Disposition: A | Discharge status: Home / Self Care | Payer: Medicare Other | Attending: Internal Medicine | Admitting: Internal Medicine

## 2012-12-22 ENCOUNTER — Encounter (HOSPITAL_COMMUNITY): Payer: Self-pay | Admitting: Emergency Medicine

## 2012-12-22 ENCOUNTER — Emergency Department (HOSPITAL_COMMUNITY): Payer: Medicare Other

## 2012-12-22 DIAGNOSIS — R569 Unspecified convulsions: Secondary | ICD-10-CM | POA: Diagnosis present

## 2012-12-22 DIAGNOSIS — Z8249 Family history of ischemic heart disease and other diseases of the circulatory system: Secondary | ICD-10-CM

## 2012-12-22 DIAGNOSIS — I129 Hypertensive chronic kidney disease with stage 1 through stage 4 chronic kidney disease, or unspecified chronic kidney disease: Secondary | ICD-10-CM | POA: Diagnosis not present

## 2012-12-22 DIAGNOSIS — IMO0002 Reserved for concepts with insufficient information to code with codable children: Secondary | ICD-10-CM

## 2012-12-22 DIAGNOSIS — R55 Syncope and collapse: Secondary | ICD-10-CM | POA: Diagnosis not present

## 2012-12-22 DIAGNOSIS — E1110 Type 2 diabetes mellitus with ketoacidosis without coma: Secondary | ICD-10-CM | POA: Diagnosis not present

## 2012-12-22 DIAGNOSIS — R7309 Other abnormal glucose: Secondary | ICD-10-CM | POA: Diagnosis not present

## 2012-12-22 DIAGNOSIS — Z91013 Allergy to seafood: Secondary | ICD-10-CM | POA: Diagnosis not present

## 2012-12-22 DIAGNOSIS — D631 Anemia in chronic kidney disease: Secondary | ICD-10-CM

## 2012-12-22 DIAGNOSIS — I10 Essential (primary) hypertension: Secondary | ICD-10-CM

## 2012-12-22 DIAGNOSIS — Z9641 Presence of insulin pump (external) (internal): Secondary | ICD-10-CM

## 2012-12-22 DIAGNOSIS — Z940 Kidney transplant status: Secondary | ICD-10-CM | POA: Diagnosis not present

## 2012-12-22 DIAGNOSIS — F445 Conversion disorder with seizures or convulsions: Secondary | ICD-10-CM

## 2012-12-22 DIAGNOSIS — Z794 Long term (current) use of insulin: Secondary | ICD-10-CM

## 2012-12-22 DIAGNOSIS — M62838 Other muscle spasm: Secondary | ICD-10-CM | POA: Diagnosis not present

## 2012-12-22 DIAGNOSIS — IMO0001 Reserved for inherently not codable concepts without codable children: Secondary | ICD-10-CM | POA: Diagnosis not present

## 2012-12-22 DIAGNOSIS — E1100 Type 2 diabetes mellitus with hyperosmolarity without nonketotic hyperglycemic-hyperosmolar coma (NKHHC): Secondary | ICD-10-CM

## 2012-12-22 DIAGNOSIS — Z8261 Family history of arthritis: Secondary | ICD-10-CM | POA: Diagnosis not present

## 2012-12-22 DIAGNOSIS — E870 Hyperosmolality and hypernatremia: Secondary | ICD-10-CM | POA: Diagnosis not present

## 2012-12-22 DIAGNOSIS — Z833 Family history of diabetes mellitus: Secondary | ICD-10-CM

## 2012-12-22 DIAGNOSIS — E1165 Type 2 diabetes mellitus with hyperglycemia: Secondary | ICD-10-CM

## 2012-12-22 DIAGNOSIS — Z79899 Other long term (current) drug therapy: Secondary | ICD-10-CM | POA: Diagnosis not present

## 2012-12-22 DIAGNOSIS — R079 Chest pain, unspecified: Secondary | ICD-10-CM | POA: Diagnosis not present

## 2012-12-22 DIAGNOSIS — R5381 Other malaise: Secondary | ICD-10-CM | POA: Diagnosis not present

## 2012-12-22 DIAGNOSIS — N189 Chronic kidney disease, unspecified: Secondary | ICD-10-CM | POA: Diagnosis present

## 2012-12-22 DIAGNOSIS — N182 Chronic kidney disease, stage 2 (mild): Secondary | ICD-10-CM | POA: Diagnosis not present

## 2012-12-22 DIAGNOSIS — E785 Hyperlipidemia, unspecified: Secondary | ICD-10-CM | POA: Diagnosis present

## 2012-12-22 DIAGNOSIS — E860 Dehydration: Secondary | ICD-10-CM

## 2012-12-22 DIAGNOSIS — E1310 Other specified diabetes mellitus with ketoacidosis without coma: Principal | ICD-10-CM | POA: Diagnosis present

## 2012-12-22 HISTORY — DX: Conversion disorder with seizures or convulsions: F44.5

## 2012-12-22 HISTORY — DX: Unspecified convulsions: R56.9

## 2012-12-22 LAB — CBC WITH DIFFERENTIAL/PLATELET
Eosinophils Absolute: 0.1 10*3/uL (ref 0.0–0.7)
HCT: 33.4 % — ABNORMAL LOW (ref 36.0–46.0)
Hemoglobin: 10.7 g/dL — ABNORMAL LOW (ref 12.0–15.0)
Lymphs Abs: 1.2 10*3/uL (ref 0.7–4.0)
MCH: 28.7 pg (ref 26.0–34.0)
MCV: 89.5 fL (ref 78.0–100.0)
Monocytes Absolute: 0.4 10*3/uL (ref 0.1–1.0)
Monocytes Relative: 7 % (ref 3–12)
Neutro Abs: 3.9 10*3/uL (ref 1.7–7.7)
Neutrophils Relative %: 70 % (ref 43–77)
Platelets: 184 10*3/uL (ref 150–400)
RBC: 3.73 MIL/uL — ABNORMAL LOW (ref 3.87–5.11)

## 2012-12-22 LAB — URINALYSIS, ROUTINE W REFLEX MICROSCOPIC
Bilirubin Urine: NEGATIVE
Hgb urine dipstick: NEGATIVE
Protein, ur: NEGATIVE mg/dL
Urobilinogen, UA: 0.2 mg/dL (ref 0.0–1.0)

## 2012-12-22 LAB — COMPREHENSIVE METABOLIC PANEL
Alkaline Phosphatase: 102 U/L (ref 39–117)
BUN: 35 mg/dL — ABNORMAL HIGH (ref 6–23)
CO2: 20 mEq/L (ref 19–32)
Creatinine, Ser: 1.54 mg/dL — ABNORMAL HIGH (ref 0.50–1.10)
GFR calc Af Amer: 52 mL/min — ABNORMAL LOW (ref >90)
Glucose, Bld: 1084 mg/dL (ref 70–99)
Potassium: 4.9 mEq/L (ref 3.5–5.1)
Sodium: 118 mEq/L — CL (ref 135–145)
Total Bilirubin: 0.5 mg/dL (ref 0.3–1.2)
Total Protein: 7.3 g/dL (ref 6.0–8.3)

## 2012-12-22 LAB — GLUCOSE, CAPILLARY
Glucose-Capillary: >600 mg/dL (ref 70–99)
Glucose-Capillary: 301 mg/dL — ABNORMAL HIGH (ref 70–99)

## 2012-12-22 LAB — POCT I-STAT TROPONIN I: Troponin i, poc: 0 ng/mL (ref 0.00–0.08)

## 2012-12-22 LAB — URINE MICROSCOPIC-ADD ON

## 2012-12-22 MED ORDER — SODIUM CHLORIDE 0.9 % IV SOLN
INTRAVENOUS | Status: DC
  Administered 2012-12-22: 5.4 [IU]/h via INTRAVENOUS
  Administered 2012-12-23: 15.1 [IU]/h via INTRAVENOUS
  Administered 2012-12-23: 7.7 [IU]/h via INTRAVENOUS
  Administered 2012-12-23: 2.7 [IU]/h via INTRAVENOUS
  Administered 2012-12-23: 4 [IU]/h via INTRAVENOUS
  Administered 2012-12-23: 1.8 [IU]/h via INTRAVENOUS
  Administered 2012-12-23: 1 [IU]/h via INTRAVENOUS
  Administered 2012-12-23: 5.8 [IU]/h via INTRAVENOUS
  Administered 2012-12-23: 13:00:00 via INTRAVENOUS
  Administered 2012-12-23: 1.4 [IU]/h via INTRAVENOUS
  Administered 2012-12-23: 0.6 [IU]/h via INTRAVENOUS
  Administered 2012-12-23: 15.4 [IU]/h via INTRAVENOUS
  Administered 2012-12-23: 14.6 [IU]/h via INTRAVENOUS
  Filled 2012-12-22 (×2): qty 1

## 2012-12-22 MED ORDER — DOCUSATE SODIUM 100 MG PO CAPS
100.0000 mg | ORAL_CAPSULE | Freq: Two times a day (BID) | ORAL | Status: DC
  Filled 2012-12-22 (×3): qty 1

## 2012-12-22 MED ORDER — DEXTROSE 50 % IV SOLN: 25.0000 mL | INTRAVENOUS | Status: DC | PRN

## 2012-12-22 MED ORDER — DEXTROSE-NACL 5-0.45 % IV SOLN: INTRAVENOUS | Status: DC

## 2012-12-22 MED ORDER — MYCOPHENOLATE SODIUM 180 MG PO TBEC
780.0000 mg | DELAYED_RELEASE_TABLET | Freq: Two times a day (BID) | ORAL | Status: DC
  Administered 2012-12-22 – 2012-12-23 (×2): 720 mg via ORAL
  Filled 2012-12-22 (×3): qty 4

## 2012-12-22 MED ORDER — ONDANSETRON HCL 4 MG PO TABS: 4.0000 mg | ORAL_TABLET | Freq: Four times a day (QID) | ORAL | Status: DC | PRN

## 2012-12-22 MED ORDER — SODIUM CHLORIDE 0.9 % IV BOLUS (SEPSIS)
1000.0000 mL | Freq: Once | INTRAVENOUS | Status: AC
  Administered 2012-12-22: 1000 mL via INTRAVENOUS

## 2012-12-22 MED ORDER — PREDNISONE 5 MG PO TABS
5.0000 mg | ORAL_TABLET | Freq: Every day | ORAL | Status: DC
  Administered 2012-12-22: 5 mg via ORAL
  Filled 2012-12-22 (×2): qty 1

## 2012-12-22 MED ORDER — SODIUM CHLORIDE 0.9 % IV SOLN
INTRAVENOUS | Status: DC
  Administered 2012-12-23: 13:00:00 via INTRAVENOUS
  Filled 2012-12-22: qty 1

## 2012-12-22 MED ORDER — SODIUM CHLORIDE 0.9 % IV SOLN: INTRAVENOUS | Status: AC

## 2012-12-22 MED ORDER — INSULIN REGULAR BOLUS VIA INFUSION
0.0000 [IU] | Freq: Three times a day (TID) | INTRAVENOUS | Status: DC
  Administered 2012-12-23: 8 [IU] via INTRAVENOUS
  Filled 2012-12-22: qty 10

## 2012-12-22 MED ORDER — ACETAMINOPHEN 325 MG PO TABS: 650.0000 mg | ORAL_TABLET | Freq: Four times a day (QID) | ORAL | Status: DC | PRN

## 2012-12-22 MED ORDER — SIMVASTATIN 20 MG PO TABS
20.0000 mg | ORAL_TABLET | Freq: Every evening | ORAL | Status: DC
  Administered 2012-12-22 – 2012-12-23 (×2): 20 mg via ORAL
  Filled 2012-12-22 (×2): qty 1

## 2012-12-22 MED ORDER — SODIUM CHLORIDE 0.9 % IV SOLN
INTRAVENOUS | Status: DC
  Administered 2012-12-23: via INTRAVENOUS

## 2012-12-22 MED ORDER — SULFAMETHOXAZOLE-TRIMETHOPRIM 400-80 MG PO TABS
1.0000 | ORAL_TABLET | Freq: Every day | ORAL | Status: DC
  Administered 2012-12-23: 1 via ORAL
  Filled 2012-12-22: qty 1

## 2012-12-22 MED ORDER — SODIUM CHLORIDE 0.9 % IV SOLN
INTRAVENOUS | Status: DC
  Administered 2012-12-22: 150 mL/h via INTRAVENOUS

## 2012-12-22 MED ORDER — INSULIN ASPART 100 UNIT/ML ~~LOC~~ SOLN
10.0000 [IU] | Freq: Once | SUBCUTANEOUS | Status: AC
  Administered 2012-12-22: 10 [IU] via SUBCUTANEOUS
  Filled 2012-12-22: qty 1

## 2012-12-22 MED ORDER — LORATADINE 10 MG PO TABS
10.0000 mg | ORAL_TABLET | Freq: Every day | ORAL | Status: DC
  Administered 2012-12-23: 10 mg via ORAL
  Filled 2012-12-22: qty 1

## 2012-12-22 MED ORDER — CALCIUM CARBONATE ANTACID 500 MG PO CHEW
2.0000 | CHEWABLE_TABLET | Freq: Every day | ORAL | Status: DC
  Administered 2012-12-23: 400 mg via ORAL
  Filled 2012-12-22: qty 2

## 2012-12-22 MED ORDER — PANTOPRAZOLE SODIUM 40 MG PO TBEC
40.0000 mg | DELAYED_RELEASE_TABLET | Freq: Every day | ORAL | Status: DC
  Administered 2012-12-23: 40 mg via ORAL
  Filled 2012-12-22: qty 1

## 2012-12-22 MED ORDER — HYDROCODONE-ACETAMINOPHEN 5-325 MG PO TABS: 1.0000 | ORAL_TABLET | ORAL | Status: DC | PRN

## 2012-12-22 MED ORDER — SODIUM CHLORIDE 0.9 % IJ SOLN
3.0000 mL | Freq: Two times a day (BID) | INTRAMUSCULAR | Status: DC
  Administered 2012-12-23: 3 mL via INTRAVENOUS

## 2012-12-22 MED ORDER — TACROLIMUS 1 MG PO CAPS
2.0000 mg | ORAL_CAPSULE | Freq: Two times a day (BID) | ORAL | Status: DC
  Administered 2012-12-23 (×2): 2 mg via ORAL
  Filled 2012-12-22 (×3): qty 2

## 2012-12-22 MED ORDER — ONDANSETRON HCL 4 MG/2ML IJ SOLN: 4.0000 mg | Freq: Four times a day (QID) | INTRAMUSCULAR | Status: DC | PRN

## 2012-12-22 MED ORDER — ACETAMINOPHEN 650 MG RE SUPP: 650.0000 mg | Freq: Four times a day (QID) | RECTAL | Status: DC | PRN

## 2012-12-22 MED ORDER — ENOXAPARIN SODIUM 40 MG/0.4ML ~~LOC~~ SOLN
40.0000 mg | SUBCUTANEOUS | Status: DC
  Administered 2012-12-23: 40 mg via SUBCUTANEOUS
  Filled 2012-12-22: qty 0.4

## 2012-12-22 MED ORDER — DIAZEPAM 5 MG/ML IJ SOLN
5.0000 mg | Freq: Once | INTRAMUSCULAR | Status: AC
  Administered 2012-12-22: 5 mg via INTRAVENOUS
  Filled 2012-12-22: qty 2

## 2012-12-22 NOTE — ED Provider Notes (Signed)
CSN: [id number]     Arrival date & time 12/22/12  1635 History   First MD Initiated Contact with Patient 12/22/12 1731     Chief Complaint  Patient presents with  . Spasms  . Near Syncope   (Consider location/radiation/quality/duration/timing/severity/associated sxs/prior Treatment) The history is provided by the patient.  [name] is a 27 y.o. female history CK D. hypertension, hyperlipidemia, diabetes here presenting with hyperglycemia and right arm spasms. She saw a new doctor recently and was switched to a insulin pump. Her sugars has been running elevated recently. She had some spasms in the right arm today and almost passed out. Denies any chest pain shortness of breath. Denies fever or vomiting.    Past Medical History  Diagnosis Date  . Chronic kidney disease   . Hypertension   . Hyperlipidemia   . Diabetes mellitus     Pt reports diagnosis in June 2011   Past Surgical History  Procedure Laterality Date  . Kidney transplant     Family History  Problem Relation Age of Onset  . Arthritis Mother   . Diabetes Mother   . Hypertension Mother    History  Substance Use Topics  . Smoking status: Never Smoker   . Smokeless tobacco: Never Used  . Alcohol Use: No   OB History   Grav Para Term Preterm Abortions TAB SAB Ect Mult Living                 Review of Systems  Musculoskeletal:       R  Arm spasms   Neurological: Positive for dizziness.  All other systems reviewed and are negative.    Allergies  Motrin; Banana; Diphenhydramine hcl; Iron dextran; and Shellfish allergy  Home Medications   Current Outpatient Rx  Name  Route  Sig  Dispense  Refill  . ACCU-CHEK FASTCLIX LANCETS MISC   Does not apply   1 Device by Does not apply route 3 (three) times daily.   1 each   11   . B-D INS SYRINGE 0.5CC/31GX5/16 31G X 5/16" 0.5 ML MISC      USE AS DIRECTED   100 each   5     Rx has expired - unused refills remain   . calcium carbonate (TUMS) 500 MG  chewable tablet   Oral   Chew 2 tablets by mouth daily.          Mariane Baumgarten Calcium (STOOL SOFTENER PO)   Oral   Take 1 tablet by mouth daily as needed (contipation).          Marland Kitchen epoetin alfa (PROCRIT) 09811 UNIT/ML injection   Subcutaneous   Inject 30,000 Units into the skin every 14 (fourteen) days. Next injection due 07/09/12.         . flintstones complete (FLINTSTONES) 60 MG chewable tablet   Oral   Chew 1 tablet by mouth daily.         . Glucose Blood (ASCENSIA AUTODISC TEST) DISK      Test blood sugar four times daily          . HYDROcodone-acetaminophen (NORCO/VICODIN) 5-325 MG per tablet   Oral   Take 2 tablets by mouth every 4 (four) hours as needed for pain.   10 tablet   0   . hydrOXYzine (ATARAX) 25 MG tablet   Oral   Take 25 mg by mouth 2 (two) times daily as needed for itching.          Marland Kitchen  insulin aspart (NOVOLOG) 100 UNIT/ML injection   Subcutaneous   Inject 7 Units into the skin 3 (three) times daily before meals.         . Insulin Pen Needle (BD PEN NEEDLE NANO U/F) 32G X 4 MM MISC      Please give syrnges that hold 100 units.  Check blood sugars twice daily.   100 each   5   . Insulin Syringe-Needle U-100 30G X 1/2" 0.5 ML MISC      Supply quantity for three shots per day dosing   1 each   11     Please give BD Brand.   . Insulin Syringes, Disposable, U-100 1 ML MISC      BD Insulin syringe 1 mL, 31 gauge, 6 mm needle   90 each   11   . lisinopril-hydrochlorothiazide (PRINZIDE,ZESTORETIC) 20-25 MG per tablet   Oral   Take 0.5 tablets by mouth daily.         Marland Kitchen loratadine (CLARITIN) 10 MG tablet   Oral   Take 10 mg by mouth daily.         . mycophenolate (MYFORTIC) 360 MG TBEC   Oral   Take 780 mg by mouth 2 (two) times daily.         . ondansetron (ZOFRAN) 4 MG tablet   Oral   Take 4 mg by mouth every 12 (twelve) hours as needed for nausea.         . predniSONE (DELTASONE) 5 MG tablet   Oral   Take 5 mg by  mouth at bedtime.          . ranitidine (ZANTAC) 150 MG tablet   Oral   Take 150 mg by mouth daily as needed for heartburn.         . simvastatin (ZOCOR) 20 MG tablet   Oral   Take 20 mg by mouth every evening.         . sulfamethoxazole-trimethoprim (BACTRIM,SEPTRA) 400-80 MG per tablet   Oral   Take 1 tablet by mouth daily.         . tacrolimus (PROGRAF) 1 MG capsule   Oral   Take 2 mg by mouth 2 (two) times daily.           BP 109/68  Pulse 95  Temp(Src) 98.1 F (36.7 C) (Oral)  Resp 17  SpO2 96% Physical Exam  Nursing note and vitals reviewed. Constitutional: She is oriented to person, place, and time.  Comfortable, dehydrated   HENT:  Head: Normocephalic.  MM dry   Eyes: Conjunctivae are normal. Pupils are equal, round, and reactive to light.  Neck: Normal range of motion. Neck supple.  Cardiovascular: Normal rate, regular rhythm and normal heart sounds.   Pulmonary/Chest: Effort normal and breath sounds normal. No respiratory distress. She has no wheezes. She has no rales.  Abdominal: Soft. Bowel sounds are normal. She exhibits no distension. There is no tenderness. There is no rebound and no guarding.  Musculoskeletal: Normal range of motion.  Nl ROM bilateral arms   Neurological: She is alert and oriented to person, place, and time.  Skin: Skin is warm and dry.  Psychiatric: She has a normal mood and affect. Her behavior is normal. Judgment and thought content normal.    ED Course  Procedures (including critical care time) CRITICAL CARE Performed by: Darl Householder, DAVID   Total critical care time: 45 min   Critical care time was exclusive of separately billable procedures  and treating other patients.  Critical care was necessary to treat or prevent imminent or life-threatening deterioration.  Critical care was time spent personally by me on the following activities: development of treatment plan with patient and/or surrogate as well as nursing,  discussions with consultants, evaluation of patient's response to treatment, examination of patient, obtaining history from patient or surrogate, ordering and performing treatments and interventions, ordering and review of laboratory studies, ordering and review of radiographic studies, pulse oximetry and re-evaluation of patient's condition.   Labs Review Labs Reviewed  GLUCOSE, CAPILLARY - Abnormal; Notable for the following:    Glucose-Capillary >600 (*)    All other components within normal limits  CBC WITH DIFFERENTIAL - Abnormal; Notable for the following:    RBC 3.73 (*)    Hemoglobin 10.7 (*)    HCT 33.4 (*)    All other components within normal limits  COMPREHENSIVE METABOLIC PANEL - Abnormal; Notable for the following:    Sodium 118 (*)    Chloride 80 (*)    Glucose, Bld 1084 (*)    BUN 35 (*)    Creatinine, Ser 1.54 (*)    GFR calc non Af Amer 45 (*)    GFR calc Af Amer 52 (*)    All other components within normal limits  URINALYSIS, ROUTINE W REFLEX MICROSCOPIC - Abnormal; Notable for the following:    Specific Gravity, Urine 1.034 (*)    Glucose, UA >1000 (*)    Ketones, ur 15 (*)    All other components within normal limits  GLUCOSE, CAPILLARY - Abnormal; Notable for the following:    Glucose-Capillary >600 (*)    All other components within normal limits  URINE MICROSCOPIC-ADD ON  POCT I-STAT TROPONIN I   Imaging Review Ct Head Wo Contrast  12/22/2012   CLINICAL DATA:  27 year old female with syncope and right arm spasms.  EXAM: CT HEAD WITHOUT CONTRAST  TECHNIQUE: Contiguous axial images were obtained from the base of the skull through the vertex without intravenous contrast.  COMPARISON:  None  FINDINGS: No intracranial abnormalities are identified, including mass lesion or mass effect, hydrocephalus, extra-axial fluid collection, midline shift, hemorrhage, or acute infarction.  The visualized bony calvarium is unremarkable.  IMPRESSION: Unremarkable noncontrast  head CT   Electronically Signed   By: Hassan Rowan M.D.   On: 12/22/2012 20:36    EKG Interpretation   None       Date: 12/22/2012  Rate: 10  Rhythm: sinus tachycardia  QRS Axis: normal  Intervals: normal  ST/T Wave abnormalities: nonspecific ST changes  Conduction Disutrbances:none  Narrative Interpretation:   Old EKG Reviewed: none available   MDM   1. DKA (diabetic ketoacidoses)   2. Hyperosmolar syndrome   [name] is a 27 y.o. female here with R arm spasms and hyperglycemia and near syncope. Glucose 1000. She has AG of 18. I think she likely has hyperosmolar with some DKA. Patient given 2L NS. Started on glucose stabilizer. Will admit to step down.     Wandra Arthurs, MD 12/22/12 2124

## 2012-12-22 NOTE — ED Notes (Signed)
Per EMS and pt since yesterday she has been having spasms in right arm. sts it happened yesterday and she passed out but could hear her mom saying her name. sts the arm spasms randomly and is painful and she cant control it.

## 2012-12-22 NOTE — H&P (Signed)
PCP: Elyn Peers, MD    Chief Complaint:  Right arm spazm  HPI: [name] is a 27 y.o. female   has a past medical history of Chronic kidney disease; Hypertension; Hyperlipidemia; and Diabetes mellitus.   Presented with  2 day hx of muscle spasms and shaking in her right hand. She was awake during this incident. This episodes coming and going for the past 48 hours. Today at noon she had a severe episode.  Family describes that her right arm was moving in a smooth dance-like pattern and at times would get stuck up in the air. Patient states she could not control her actions. She was shaking all over but not rhythmically. Mother found her floor screaming for help and shaking. Patient never lost consciousness had no bladder or bowel incontinence. Patient was brought in by EMS to emergency departments found to have blood sugar 1084. Of note patient has history of renal transplant done in 2007 for which she has been on prednisone Prograf and Myfortic. She is followed for that by renal. Her current creatinine is at baseline. Per family had diabetes started secondary to prednisone. At first it was controlled with Lantus but for the past one month patient switched her primary care provider to Dr. Criss Rosales and was started on insulin pump. Patient feels that her insulin pump is not working properly. She has been taking it off when she's not feeling well or when her blood sugar is low. For example this morning her blood sugar was 70s and then went down to 60s she hasn't felt well and did not eat breakfast. She started to feel hypoglycemic and took her pump off. Patient is not sure if her pump is actually administering insulin as it supposed to. She states that recently she was switched apart from Lantus as it was not controlled her blood sugar.  Denies any fever or chills. Patient did have an episode of chest pain while having a shaking episode. Of note she has also slid down on the floor and hit her head CT  scan was done in emergency department she was unremarkable.  Review of Systems:    Pertinent positives include:chest pain,  Constitutional:  No weight loss, night sweats, Fevers, chills, fatigue, weight loss  HEENT:  No headaches, Difficulty swallowing,Tooth/dental problems,Sore throat,  No sneezing, itching, ear ache, nasal congestion, post nasal drip,  Cardio-vascular:  No  Orthopnea, PND, anasarca, dizziness, palpitations.no Bilateral lower extremity swelling  GI:  No heartburn, indigestion, abdominal pain, nausea, vomiting, diarrhea, change in bowel habits, loss of appetite, melena, blood in stool, hematemesis Resp:  no shortness of breath at rest. No dyspnea on exertion, No excess mucus, no productive cough, No non-productive cough, No coughing up of blood.No change in color of mucus.No wheezing. Skin:  no rash or lesions. No jaundice GU:  no dysuria, change in color of urine, no urgency or frequency. No straining to urinate.  No flank pain.  Musculoskeletal:  No joint pain or no joint swelling. No decreased range of motion. No back pain.  Psych:  No change in mood or affect. No depression or anxiety. No memory loss.  Neuro: no localizing neurological complaints, no tingling, no weakness, no double vision, no gait abnormality, no slurred speech, no confusion  Otherwise ROS are negative except for above, 10 systems were reviewed  Past Medical History: Past Medical History  Diagnosis Date  . Chronic kidney disease   . Hypertension   . Hyperlipidemia   . Diabetes mellitus  Pt reports diagnosis in June 2011   Past Surgical History  Procedure Laterality Date  . Kidney transplant       Medications: Prior to Admission medications   Medication Sig Start Date End Date Taking? Authorizing Provider  ACCU-CHEK FASTCLIX LANCETS MISC 1 Device by Does not apply route 3 (three) times daily. 04/14/11  Yes Cletus Gash, MD  B-D INS SYRINGE 0.5CC/31GX5/16 31G X 5/16" 0.5 ML  MISC USE AS DIRECTED 06/18/11  Yes Cletus Gash, MD  calcium carbonate (TUMS) 500 MG chewable tablet Chew 2 tablets by mouth daily.    Yes Historical Provider, MD  Docusate Calcium (STOOL SOFTENER PO) Take 1 tablet by mouth daily as needed (contipation).    Yes Historical Provider, MD  epoetin alfa (PROCRIT) 09811 UNIT/ML injection Inject 30,000 Units into the skin every 14 (fourteen) days. Next injection due 07/09/12.   Yes Historical Provider, MD  flintstones complete (FLINTSTONES) 60 MG chewable tablet Chew 1 tablet by mouth daily.   Yes Historical Provider, MD  Glucose Blood (ASCENSIA AUTODISC TEST) DISK Test blood sugar four times daily    Yes Historical Provider, MD  hydrOXYzine (ATARAX) 25 MG tablet Take 25 mg by mouth 2 (two) times daily as needed for itching.    Yes Historical Provider, MD  Insulin Pen Needle (BD PEN NEEDLE NANO U/F) 32G X 4 MM MISC Please give syrnges that hold 100 units.  Check blood sugars twice daily. 01/20/12  Yes Cletus Gash, MD  Insulin Syringe-Needle U-100 30G X 1/2" 0.5 ML MISC Supply quantity for three shots per day dosing 02/04/12  Yes Cletus Gash, MD  Insulin Syringes, Disposable, U-100 1 ML MISC BD Insulin syringe 1 mL, 31 gauge, 6 mm needle 03/17/12  Yes Cletus Gash, MD  lisinopril-hydrochlorothiazide (PRINZIDE,ZESTORETIC) 20-25 MG per tablet Take 1 tablet by mouth daily.    Yes Historical Provider, MD  loratadine (CLARITIN) 10 MG tablet Take 10 mg by mouth daily.   Yes Historical Provider, MD  mycophenolate (MYFORTIC) 360 MG TBEC Take 780 mg by mouth 2 (two) times daily.   Yes Historical Provider, MD  ondansetron (ZOFRAN) 4 MG tablet Take 4 mg by mouth every 12 (twelve) hours as needed for nausea.   Yes Historical Provider, MD  predniSONE (DELTASONE) 5 MG tablet Take 5 mg by mouth at bedtime.    Yes Historical Provider, MD  ranitidine (ZANTAC) 150 MG tablet Take 150 mg by mouth daily as needed for heartburn.   Yes Historical Provider, MD   simvastatin (ZOCOR) 20 MG tablet Take 20 mg by mouth every evening.   Yes Historical Provider, MD  sulfamethoxazole-trimethoprim (BACTRIM,SEPTRA) 400-80 MG per tablet Take 1 tablet by mouth daily.   Yes Historical Provider, MD  tacrolimus (PROGRAF) 1 MG capsule Take 2 mg by mouth 2 (two) times daily.    Yes Historical Provider, MD    Allergies:   Allergies  Allergen Reactions  . Motrin [Ibuprofen] Shortness Of Breath and Itching    Per pt  . Banana Other (See Comments)    Sick on the stomach  . Diphenhydramine Hcl     REACTION: Stopped breathing in ICU  . Iron Dextran     REACTION: vein irritation  . Shellfish Allergy Hives    Social History:  Ambulatory independently  Lives at home with family   reports that she has never smoked. She has never used smokeless tobacco. She reports that she does not drink alcohol or use illicit drugs.   Family History: family history  includes Arthritis in her mother; Diabetes in her mother; Hypertension in her mother.    Physical Exam: Patient Vitals for the past 24 hrs:  BP Temp Temp src Pulse Resp SpO2  12/22/12 1730 109/68 mmHg - - 95 17 96 %  12/22/12 1700 129/86 mmHg - - 89 17 94 %  12/22/12 1646 117/91 mmHg 98.1 F (36.7 C) Oral 99 16 97 %    1. General:  in No Acute distress 2. Psychological: Alert and  Oriented 3. Head/ENT:   Moist  Mucous Membranes                          Head Non traumatic, neck supple                          Normal Dentition 4. SKIN:  decreased Skin turgor,  Skin clean Dry and intact no rash 5. Heart: Regular rate and rhythm no Murmur, Rub or gallop 6. Lungs: Clear to auscultation bilaterally, no wheezes or crackles   7. Abdomen: Soft, non-tender, Non distended slightly obese 8. Lower extremities: no clubbing, cyanosis, or edema 9. Neurologically Grossly intact, moving all 4 extremities equally 10. MSK: Normal range of motion  body mass index is unknown because there is no weight on file.   Labs on  Admission:   Recent Labs  12/22/12 1752  NA 118*  K 4.9  CL 80*  CO2 20  GLUCOSE 1084*  BUN 35*  CREATININE 1.54*  CALCIUM 8.7    Recent Labs  12/22/12 1752  AST 21  ALT 25  ALKPHOS 102  BILITOT 0.5  PROT 7.3  ALBUMIN 4.1   No results found for this basename: LIPASE, AMYLASE,  in the last 72 hours  Recent Labs  12/22/12 1752  WBC 5.5  NEUTROABS 3.9  HGB 10.7*  HCT 33.4*  MCV 89.5  PLT 184   No results found for this basename: CKTOTAL, CKMB, CKMBINDEX, TROPONINI,  in the last 72 hours No results found for this basename: TSH, T4TOTAL, FREET3, T3FREE, THYROIDAB,  in the last 72 hours No results found for this basename: VITAMINB12, FOLATE, FERRITIN, TIBC, IRON, RETICCTPCT,  in the last 72 hours Lab Results  Component Value Date   HGBA1C 12.7 05/10/2012    The CrCl is unknown because both a height and weight (above a minimum accepted value) are required for this calculation. ABG    Component Value Date/Time   HCO3 27.2* 10/26/2007 1859   TCO2 29 10/26/2007 1859   O2SAT 18.0 10/26/2007 1859     No results found for this basename: DDIMER     Other results:  I have pearsonaly reviewed this: ECG REPORT  Rate: 102  Rhythm: Sinus tachycardia ST&T Change: Specific changes  UA no evidence of infection, hyper glycemia and ketosis present   Cultures:    Component Value Date/Time   SDES BLOOD RIGHT WRIST 10/26/2007 2307   SPECREQUEST BOTTLES DRAWN AEROBIC AND ANAEROBIC [place] AER 3CC ANA 10/26/2007 2307   CULT NO GROWTH 5 DAYS 10/26/2007 2307   REPTSTATUS 11/02/2007 FINAL 10/26/2007 2307       Radiological Exams on Admission: Ct Head Wo Contrast  12/22/2012   CLINICAL DATA:  27 year old female with syncope and right arm spasms.  EXAM: CT HEAD WITHOUT CONTRAST  TECHNIQUE: Contiguous axial images were obtained from the base of the skull through the vertex without intravenous contrast.  COMPARISON:  None  FINDINGS:  No intracranial abnormalities are identified,  including mass lesion or mass effect, hydrocephalus, extra-axial fluid collection, midline shift, hemorrhage, or acute infarction.  The visualized bony calvarium is unremarkable.  IMPRESSION: Unremarkable noncontrast head CT   Electronically Signed   By: Hassan Rowan M.D.   On: 12/22/2012 20:36    Chart has been reviewed  Assessment/Plan  27 year old female who presents with mild DKA likely secondary to inappropriate use of insulin pump versus insulin pump malfunction. The setting of hyperglycemia she'll likely had a pseudoseizure.  Present on Admission:  . DKA, type 2, not at goal - will admit her DKA protocol given the patient is type II diabetic will not switch to D5 half normal. Would need rapid followup with her primary care regarding use of insulin pump. Will order diabetic education while here  . Essential hypertension, benign - continue home medications  . CHRONIC KIDNEY DISEASE STAGE II (MILD) - patient is status post renal transplant we'll continue her antirejection medications  . DM (diabetes mellitus), type 2, uncontrolled - the patient will need reassessment of her ability to use insulin pump as well as if  It is functioning properly.  . Dehydration - IV fluids  . Chest pain - the setting of hyperglycemia in a young female but with risk factors of hypertension and diabetes we'll cycle cardiac markers  . 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    Prophylaxis ; Lovenox, Protonix  CODE STATUS: Full code  Other plan as per orders.  I have spent a total of 60 min on this admission  Calene Paradiso 12/22/2012, 9:26 PM

## 2012-12-22 NOTE — ED Notes (Signed)
Pt had witnessed episode of muscle spasm in right arm. Then pt whole body proceeded to lock up. Pt was crying and talking during episode. Pt eyes were looking to the right.

## 2012-12-23 ENCOUNTER — Inpatient Hospital Stay (HOSPITAL_COMMUNITY): Payer: Medicare Other

## 2012-12-23 DIAGNOSIS — E1101 Type 2 diabetes mellitus with hyperosmolarity with coma: Secondary | ICD-10-CM

## 2012-12-23 DIAGNOSIS — I129 Hypertensive chronic kidney disease with stage 1 through stage 4 chronic kidney disease, or unspecified chronic kidney disease: Secondary | ICD-10-CM | POA: Diagnosis not present

## 2012-12-23 DIAGNOSIS — E1110 Type 2 diabetes mellitus with ketoacidosis without coma: Secondary | ICD-10-CM | POA: Diagnosis not present

## 2012-12-23 DIAGNOSIS — I10 Essential (primary) hypertension: Secondary | ICD-10-CM

## 2012-12-23 DIAGNOSIS — E860 Dehydration: Secondary | ICD-10-CM

## 2012-12-23 DIAGNOSIS — D631 Anemia in chronic kidney disease: Secondary | ICD-10-CM

## 2012-12-23 DIAGNOSIS — Z940 Kidney transplant status: Secondary | ICD-10-CM | POA: Diagnosis not present

## 2012-12-23 DIAGNOSIS — E1310 Other specified diabetes mellitus with ketoacidosis without coma: Secondary | ICD-10-CM | POA: Diagnosis not present

## 2012-12-23 DIAGNOSIS — R569 Unspecified convulsions: Secondary | ICD-10-CM | POA: Diagnosis not present

## 2012-12-23 DIAGNOSIS — IMO0001 Reserved for inherently not codable concepts without codable children: Secondary | ICD-10-CM

## 2012-12-23 LAB — GLUCOSE, CAPILLARY
Glucose-Capillary: 282 mg/dL — ABNORMAL HIGH (ref 70–99)
Glucose-Capillary: 118 mg/dL — ABNORMAL HIGH (ref 70–99)
Glucose-Capillary: 148 mg/dL — ABNORMAL HIGH (ref 70–99)
Glucose-Capillary: 156 mg/dL — ABNORMAL HIGH (ref 70–99)
Glucose-Capillary: 193 mg/dL — ABNORMAL HIGH (ref 70–99)
Glucose-Capillary: 217 mg/dL — ABNORMAL HIGH (ref 70–99)
Glucose-Capillary: 161 mg/dL — ABNORMAL HIGH (ref 70–99)
Glucose-Capillary: 107 mg/dL — ABNORMAL HIGH (ref 70–99)
Glucose-Capillary: 253 mg/dL — ABNORMAL HIGH (ref 70–99)
Glucose-Capillary: 385 mg/dL — ABNORMAL HIGH (ref 70–99)
Glucose-Capillary: 303 mg/dL — ABNORMAL HIGH (ref 70–99)
Glucose-Capillary: 276 mg/dL — ABNORMAL HIGH (ref 70–99)
Glucose-Capillary: 145 mg/dL — ABNORMAL HIGH (ref 70–99)
Glucose-Capillary: 80 mg/dL (ref 70–99)

## 2012-12-23 LAB — CBC
HCT: 29.5 % — ABNORMAL LOW (ref 36.0–46.0)
Hemoglobin: 10.9 g/dL — ABNORMAL LOW (ref 12.0–15.0)
MCH: 29.5 pg (ref 26.0–34.0)
MCHC: 35.6 g/dL (ref 30.0–36.0)
MCV: 81.9 fL (ref 78.0–100.0)
Platelets: 195 10*3/uL (ref 150–400)
RBC: 3.7 MIL/uL — ABNORMAL LOW (ref 3.87–5.11)
RDW: 13.4 % (ref 11.5–15.5)
WBC: 6.2 10*3/uL (ref 4.0–10.5)

## 2012-12-23 LAB — BASIC METABOLIC PANEL
BUN: 24 mg/dL — ABNORMAL HIGH (ref 6–23)
CO2: 24 mEq/L (ref 19–32)
CO2: 21 mEq/L (ref 19–32)
Calcium: 8.5 mg/dL (ref 8.4–10.5)
Chloride: 100 mEq/L (ref 96–112)
Chloride: 100 mEq/L (ref 96–112)
Creatinine, Ser: 1.08 mg/dL (ref 0.50–1.10)
GFR calc Af Amer: 69 mL/min — ABNORMAL LOW (ref >90)
GFR calc non Af Amer: 59 mL/min — ABNORMAL LOW (ref >90)
GFR calc non Af Amer: 63 mL/min — ABNORMAL LOW (ref >90)
Glucose, Bld: 162 mg/dL — ABNORMAL HIGH (ref 70–99)
Potassium: 3.3 mEq/L — ABNORMAL LOW (ref 3.5–5.1)
Potassium: 3.3 mEq/L — ABNORMAL LOW (ref 3.5–5.1)
Sodium: 140 mEq/L (ref 135–145)
Sodium: 139 mEq/L (ref 135–145)
Sodium: 136 mEq/L (ref 135–145)

## 2012-12-23 LAB — TSH: TSH: 2.068 u[IU]/mL (ref 0.350–4.500)

## 2012-12-23 LAB — HEMOGLOBIN A1C: Hgb A1c MFr Bld: >20 % — ABNORMAL HIGH (ref <5.7)

## 2012-12-23 LAB — TROPONIN I
Troponin I: <0.3 ng/mL (ref <0.30)
Troponin I: <0.3 ng/mL (ref <0.30)
Troponin I: <0.3 ng/mL (ref <0.30)

## 2012-12-23 MED ORDER — POTASSIUM CHLORIDE 10 MEQ/100ML IV SOLN
10.0000 meq | INTRAVENOUS | Status: AC
  Administered 2012-12-23 (×4): 10 meq via INTRAVENOUS
  Filled 2012-12-23 (×4): qty 100

## 2012-12-23 MED ORDER — INSULIN PUMP
Freq: Three times a day (TID) | SUBCUTANEOUS | Status: DC
  Filled 2012-12-23: qty 1

## 2012-12-23 MED ORDER — DEXTROSE-NACL 5-0.45 % IV SOLN
INTRAVENOUS | Status: DC
  Administered 2012-12-23 (×2): via INTRAVENOUS

## 2012-12-23 NOTE — Progress Notes (Signed)
Inpatient Diabetes Program Recommendations  AACE/ADA: New Consensus Statement on Inpatient Glycemic Control (2013)  Target Ranges:  Prepandial:   less than 140 mg/dL      Peak postprandial:   less than 180 mg/dL (1-2 hours)      Critically ill patients:  140 - 180 mg/dL   Pharmacy paged this coordinator for assistance with insulin pump. This coordinator spoke with patient who states she wears a VGo insulin pump approximately one month. The VGo pump is a preset insulin delivery device that gives her 40 units basal insulin and allows her to deliver 2 units "per click" for meal boluses.  Patient states her VGo is a 40 unit basal pump.  Patient states she takes her pump off if she has a low. When asked what a low is she states 75. Asked patient not to stop using her pump without an alternative dose of subcutaneous basal insulin to prevent DKA and hyperglycemia.  Patient states she will call her endocrinologist Monday and tell them she has been in the hospital for DKA and get an appointment ASAP for follow-up.   This coordinator called Dr. Coralyn Pear to discuss situation and assist with pump order entry.   Also, spoke with Vicente Males RN to explain continuing insulin gtt for 1-2 hours after pump restarted and document insulin pump assessment. Thank you  Raoul Pitch BSN, RN,CDE Inpatient Diabetes Coordinator [phone] (team pager)

## 2012-12-23 NOTE — Progress Notes (Signed)
The patient arrived from the ED to room 4E13 at 2345.  The patient was oriented to the unit and placed on telemetry.  Her VS were taken and she was assessed.  Her admission history was also taken.  Suction and oxygen were placed in the room for her seizure precautions.  She is A&Ox4 with no complaints of pain.  She is currently on a Glucostabilizer and her blood sugars are now below 300.  Her call bell was placed within reach and she was told to call staff if she needed to get up for any reason.

## 2012-12-23 NOTE — Progress Notes (Signed)
0730 seen and evaluated by Dr. Coralyn Pear . With orders.

## 2012-12-23 NOTE — Discharge Summary (Signed)
Physician Discharge Summary  [name] [id number] DOB: 11/28/1985 DOA: 12/22/2012  PCP: Valerie Peers, MD  Admit date: 12/22/2012 Discharge date: 12/23/2012  Time spent: 40 minutes  Recommendations for Outpatient Follow-up:  1. Please followup on blood sugars 2. Please followup on BMP on her next office visit, patient presented with multiple electrolyte abnormalities, during this hospitalization received potassium replacement.  Discharge Diagnoses:  Active Problems:   DM (diabetes mellitus), type 2, uncontrolled   Essential hypertension, benign   CHRONIC KIDNEY DISEASE STAGE II (MILD)   KIDNEY TRANSPLANTATION, HX OF   DKA, type 2, not at goal   Dehydration   Chest pain   Pseudoseizures   Discharge Condition: Stable/improved  Diet recommendation: Heart healthy/diabetic diet  Filed Weights   12/22/12 2301  Weight: 77.656 kg (171 lb 3.2 oz)    History of present illness:  Presented with  2 day hx of muscle spasms and shaking in her right hand. She was awake during this incident. This episodes coming and going for the past 48 hours. Today at noon she had a severe episode. Family describes that her right arm was moving in a smooth dance-like pattern and at times would get stuck up in the air. Patient states she could not control her actions. She was shaking all over but not rhythmically. Mother found her floor screaming for help and shaking. Patient never lost consciousness had no bladder or bowel incontinence. Patient was brought in by EMS to emergency departments found to have blood sugar 1084. Of note patient has history of renal transplant done in 2007 for which she has been on prednisone Prograf and Myfortic. She is followed for that by renal. Her current creatinine is at baseline. Per family had diabetes started secondary to prednisone. At first it was controlled with Lantus but for the past one month patient switched her primary care provider to Dr. Criss [name] and was started  on insulin pump. Patient feels that her insulin pump is not working properly. She has been taking it off when she's not feeling well or when her blood sugar is low. For example this morning her blood sugar was 70s and then went down to 60s she hasn't felt well and did not eat breakfast. She started to feel hypoglycemic and took her pump off. Patient is not sure if her pump is actually administering insulin as it supposed to. She states that recently she was switched apart from Lantus as it was not controlled her blood sugar.  Denies any fever or chills. Patient did have an episode of chest pain while having a shaking episode. Of note she has also slid down on the floor and hit her head CT scan was done in emergency department she was unremarkable.   Hospital Course:  Patient is a pleasant 27 year old female with a past medical history of insulin-dependent diabetes mellitus presented with complaints of muscle spasms, brought to the emergency department by EMS where she was found to have a blood sugar of 1084. Initial lab work showed a sodium of 118, bicarbonate of 20 with creatinine of 1.54. She was started on IV insulin and admitted to the medicine service. Overnight her potassium fell to 2.9, which was replaced with IV potassium chloride. Sodium improved to 136 by the following morning. Kidney function also improved with creatinine coming down to 1.08. Her IV insulin was weaned off as she was started back on her insulin pump. Blood sugars improving, with her most recent Accu-Chek of 118. With regard to muscle  spasms, there was concerns for seizure activity for which she had an EEG performed during this hospitalization; was read as unremarkable. Patient's spasms resolved with the control of her blood sugars and correction of electrolytes. Patient admitted to a few dietary indiscretions, which included eating candies and chocolates. She had also reported taking off her insulin pump at times. The diabetic  educator was consulted during this hospitalization to provide instruction on insulin pump management as well as diabetic meal planning. Given significant clinical improvement she was discharged on 12/23/2012, with instructions to call her endocrinologist on Monday morning to setup an appointment early this week.  Procedures:  EEG performed on 12/23/2012 impression: Normal awake and drowsy EEG, no focal or generalized epileptiform discharges noted.   Discharge Exam: Filed Vitals:   12/23/12 1430  BP: 116/72  Pulse: 108  Temp: 99 F (37.2 C)  Resp: 20    General: Patient in no acute distress, reports feeling better, states feeling ready to go home today. Cardiovascular: Regular rate and rhythm normal S1-S2 no murmurs rubs or gallop Respiratory: Lungs are clear to auscultation bilaterally no wheezing rhonchi or rales Abdomen: Soft nontender nondistended positive bowel sounds Extremities no cyanosis clubbing or edema  Discharge Instructions  Discharge Orders   Future Appointments Provider Department Dept Phone   12/26/2012 10:30 AM [facility] [place] [phone]   Future Orders Complete By Expires   Call MD for:  difficulty breathing, headache or visual disturbances  As directed    Call MD for:  extreme fatigue  As directed    Call MD for:  persistant dizziness or light-headedness  As directed    Call MD for:  persistant nausea and vomiting  As directed    Call MD for:  temperature >100.4  As directed    Diet - low sodium heart healthy  As directed    Discharge instructions  As directed    Comments:     Please call your endocrinologist on Monday morning to set up an appointment early this week.   Increase activity slowly  As directed        Medication List         ACCU-CHEK FASTCLIX LANCETS Misc  1 Device by Does not apply route 3 (three) times daily.     ASCENSIA AUTODISC TEST Disk  Generic drug:  Glucose Blood  Test  blood sugar four times daily     B-D INS SYRINGE 0.5CC/31GX5/16 31G X 5/16" 0.5 ML Misc  Generic drug:  Insulin Syringe-Needle U-100  USE AS DIRECTED     Insulin Syringe-Needle U-100 30G X 1/2" 0.5 ML Misc  Supply quantity for three shots per day dosing     calcium carbonate 500 MG chewable tablet  Commonly known as:  TUMS - dosed in mg elemental calcium  Chew 2 tablets by mouth daily.     flintstones complete 60 MG chewable tablet  Chew 1 tablet by mouth daily.     hydrOXYzine 25 MG tablet  Commonly known as:  ATARAX/VISTARIL  Take 25 mg by mouth 2 (two) times daily as needed for itching.     Insulin Pen Needle 32G X 4 MM Misc  Commonly known as:  BD PEN NEEDLE NANO U/F  Please give syrnges that hold 100 units.  Check blood sugars twice daily.     Insulin Syringes (Disposable) U-100 1 ML Misc  BD Insulin syringe 1 mL, 31 gauge, 6 mm needle  lisinopril-hydrochlorothiazide 20-25 MG per tablet  Commonly known as:  PRINZIDE,ZESTORETIC  Take 1 tablet by mouth daily.     loratadine 10 MG tablet  Commonly known as:  CLARITIN  Take 10 mg by mouth daily.     mycophenolate 360 MG Tbec EC tablet  Commonly known as:  MYFORTIC  Take 780 mg by mouth 2 (two) times daily.     ondansetron 4 MG tablet  Commonly known as:  ZOFRAN  Take 4 mg by mouth every 12 (twelve) hours as needed for nausea.     predniSONE 5 MG tablet  Commonly known as:  DELTASONE  Take 5 mg by mouth at bedtime.     PROCRIT 96295 UNIT/ML injection  Generic drug:  epoetin alfa  Inject 30,000 Units into the skin every 14 (fourteen) days. Next injection due 07/09/12.     ranitidine 150 MG tablet  Commonly known as:  ZANTAC  Take 150 mg by mouth daily as needed for heartburn.     simvastatin 20 MG tablet  Commonly known as:  ZOCOR  Take 20 mg by mouth every evening.     STOOL SOFTENER PO  Take 1 tablet by mouth daily as needed (contipation).     sulfamethoxazole-trimethoprim 400-80 MG per tablet   Commonly known as:  BACTRIM,SEPTRA  Take 1 tablet by mouth daily.     tacrolimus 1 MG capsule  Commonly known as:  PROGRAF  Take 2 mg by mouth 2 (two) times daily.       Allergies  Allergen Reactions  . Motrin [Ibuprofen] Shortness Of Breath and Itching    Per pt  . Banana Other (See Comments)    Sick on the stomach  . Diphenhydramine Hcl     REACTION: Stopped breathing in ICU  . Iron Dextran     REACTION: vein irritation  . Shellfish Allergy Hives       Follow-up Information   Follow up with Valerie Peers, MD In 3 days.   Specialty:  Family Medicine   Contact information:   [address] [place] [address] [phone]       Follow up with Valerie Peers, MD.   Specialty:  Family Medicine   Contact information:   [place]  [address] [phone]        The results of significant diagnostics from this hospitalization (including imaging, microbiology, ancillary and laboratory) are listed below for reference.    Significant Diagnostic Studies: Ct Head Wo Contrast  12/22/2012   CLINICAL DATA:  27 year old female with syncope and right arm spasms.  EXAM: CT HEAD WITHOUT CONTRAST  TECHNIQUE: Contiguous axial images were obtained from the base of the skull through the vertex without intravenous contrast.  COMPARISON:  None  FINDINGS: No intracranial abnormalities are identified, including mass lesion or mass effect, hydrocephalus, extra-axial fluid collection, midline shift, hemorrhage, or acute infarction.  The visualized bony calvarium is unremarkable.  IMPRESSION: Unremarkable noncontrast head CT   Electronically Signed   By: Hassan Rowan M.D.   On: 12/22/2012 20:36    Microbiology: No results found for this or any previous visit (from the past 240 hour(s)).   Labs: Basic Metabolic Panel:  Recent Labs Lab 12/22/12 1752 12/23/12 0030 12/23/12 0251 12/23/12 0615  NA 118* 140 139 136  K 4.9 3.3* 2.9* 3.3*  CL 80* 100 100 99   CO2 20 28 24 21   GLUCOSE 1084* 104* 160* 162*  BUN 35* 25* 24*  21  CREATININE 1.54* 1.22* 1.16* 1.08  CALCIUM 8.7 8.5 8.1* 8.0*  MG  --   --  1.9  --   PHOS  --   --  3.5  --    Liver Function Tests:  Recent Labs Lab 12/22/12 1752  AST 21  ALT 25  ALKPHOS 102  BILITOT 0.5  PROT 7.3  ALBUMIN 4.1   No results found for this basename: LIPASE, AMYLASE,  in the last 168 hours No results found for this basename: AMMONIA,  in the last 168 hours CBC:  Recent Labs Lab 12/22/12 1752 12/23/12 0030 12/23/12 0251  WBC 5.5 7.5 6.2  NEUTROABS 3.9  --   --   HGB 10.7* 10.9* 10.5*  HCT 33.4* 30.3* 29.5*  MCV 89.5 81.9 82.2  PLT 184 203 195   Cardiac Enzymes:  Recent Labs Lab 12/23/12 0030 12/23/12 0615 12/23/12 1325  TROPONINI <0.30 <0.30 <0.30   BNP: BNP (last 3 results) No results found for this basename: PROBNP,  in the last 8760 hours CBG:  Recent Labs Lab 12/23/12 1032 12/23/12 1142 12/23/12 1252 12/23/12 1428 12/23/12 1533  GLUCAP 385* 303* 276* 145* 80       Signed:  Shed Nixon  [facility] 12/23/2012, 5:03 PM

## 2012-12-23 NOTE — Procedures (Signed)
EEG report.  Brief clinical history: 27 y/o with 2 day hx of muscle spasms and shaking in her right hand. She was awake during this incident. This episodes coming and going for the past 48 hours. The day of admission she had a severe episode. family describes that her right arm was moving in a smooth dance-like pattern and at times would get stuck up in the air. Patient states she could not control her actions. She was shaking all over but not rhythmically. Mother found her floor screaming for help and shaking. Patient never lost consciousness had no bladder or bowel incontinence. Patient was brought in by EMS to emergency departments found to have blood sugar 1084.   Technique: this is a 17 channel routine scalp EEG performed at the bedside with bipolar and monopolar montages arranged in accordance to the international 10/20 system of electrode placement. One channel was dedicated to EKG recording.  The study was performed during wakefulness and drowsiness. Hyperventilation was the sole activating procedure employed during the test.  Description:In the wakeful state, the best background consisted of a medium amplitude, posterior dominant, well sustained, symmetric and reactive 10 Hz rhythm. Drowsiness demonstrated dropout of the alpha rhythm. Hyperventilation induced mild, diffuse, physiologic slowing but no epileptiform discharges. No focal or generalized epileptiform discharges noted.  No slowing seen.  EKG showed sinus rhythm.  Impression: this is a normal awake and drowsy EEG. Please, be aware that a normal EEG does not exclude the possibility of epilepsy.  Clinical correlation is advised.  Dorian Pod, MD

## 2012-12-23 NOTE — Progress Notes (Addendum)
The patient's potassium level is 3.3.  The [facility] NP was notified.  New orders were given to give four runs of potassium.  The RN will carry out the orders.

## 2012-12-23 NOTE — Progress Notes (Signed)
The patient did not have a diet order and had several normal saline infusion orders.  The [facility] NP was notified.  Orders were given to place the patient NPO due to her being on a Glucostabilizer and to run NS at 250 mL/hr.  The order to run 999 mL/hr for two consecutive hours was not to be given.  The RN will carry out the orders and will continue to monitor the patient.

## 2012-12-23 NOTE — Progress Notes (Signed)
EEG Completed; Results Pending  

## 2012-12-26 ENCOUNTER — Encounter (HOSPITAL_COMMUNITY)
Admission: RE | Admit: 2012-12-26 | Discharge: 2012-12-26 | Disposition: A | Discharge status: Home / Self Care | Payer: Medicare Other | Source: Ambulatory Visit | Attending: Nephrology | Admitting: Nephrology

## 2012-12-26 DIAGNOSIS — D638 Anemia in other chronic diseases classified elsewhere: Secondary | ICD-10-CM | POA: Diagnosis not present

## 2012-12-26 DIAGNOSIS — N183 Chronic kidney disease, stage 3 unspecified: Secondary | ICD-10-CM | POA: Diagnosis not present

## 2012-12-26 LAB — IRON AND TIBC
Saturation Ratios: 22 % (ref 20–55)
TIBC: 232 ug/dL — ABNORMAL LOW (ref 250–470)
UIBC: 182 ug/dL (ref 125–400)

## 2012-12-26 LAB — POCT HEMOGLOBIN-HEMACUE: Hemoglobin: 10.9 g/dL — ABNORMAL LOW (ref 12.0–15.0)

## 2012-12-26 MED ORDER — EPOETIN ALFA 40000 UNIT/ML IJ SOLN
25000.0000 [IU] | INTRAMUSCULAR | Status: DC
  Administered 2012-12-26: 25000 [IU] via SUBCUTANEOUS

## 2012-12-26 MED ORDER — EPOETIN ALFA 10000 UNIT/ML IJ SOLN
INTRAMUSCULAR | Status: AC
  Filled 2012-12-26: qty 1

## 2012-12-26 MED ORDER — EPOETIN ALFA 20000 UNIT/ML IJ SOLN
INTRAMUSCULAR | Status: AC
  Filled 2012-12-26: qty 1

## 2012-12-27 MED FILL — Epoetin Alfa Inj 20000 Unit/ML: INTRAMUSCULAR | Qty: 1 | Status: AC

## 2012-12-27 MED FILL — Epoetin Alfa Inj 10000 Unit/ML: INTRAMUSCULAR | Qty: 1 | Status: AC

## 2013-01-03 DIAGNOSIS — I519 Heart disease, unspecified: Secondary | ICD-10-CM | POA: Diagnosis not present

## 2013-01-03 DIAGNOSIS — I10 Essential (primary) hypertension: Secondary | ICD-10-CM | POA: Diagnosis not present

## 2013-01-03 DIAGNOSIS — E119 Type 2 diabetes mellitus without complications: Secondary | ICD-10-CM | POA: Diagnosis not present

## 2013-01-09 ENCOUNTER — Encounter (HOSPITAL_COMMUNITY)
Admission: RE | Admit: 2013-01-09 | Discharge: 2013-01-09 | Disposition: A | Discharge status: Home / Self Care | Payer: Medicare Other | Source: Ambulatory Visit | Attending: Nephrology | Admitting: Nephrology

## 2013-01-09 DIAGNOSIS — Z940 Kidney transplant status: Secondary | ICD-10-CM | POA: Diagnosis not present

## 2013-01-09 DIAGNOSIS — N183 Chronic kidney disease, stage 3 unspecified: Secondary | ICD-10-CM | POA: Diagnosis not present

## 2013-01-09 DIAGNOSIS — Z79899 Other long term (current) drug therapy: Secondary | ICD-10-CM | POA: Diagnosis not present

## 2013-01-09 DIAGNOSIS — D638 Anemia in other chronic diseases classified elsewhere: Secondary | ICD-10-CM | POA: Diagnosis not present

## 2013-01-09 DIAGNOSIS — R7989 Other specified abnormal findings of blood chemistry: Secondary | ICD-10-CM | POA: Diagnosis not present

## 2013-01-09 LAB — POCT HEMOGLOBIN-HEMACUE: Hemoglobin: 10.5 g/dL — ABNORMAL LOW (ref 12.0–15.0)

## 2013-01-09 MED ORDER — EPOETIN ALFA 20000 UNIT/ML IJ SOLN
INTRAMUSCULAR | Status: AC
  Administered 2013-01-09: 20000 [IU]
  Filled 2013-01-09: qty 1

## 2013-01-09 MED ORDER — EPOETIN ALFA 40000 UNIT/ML IJ SOLN: 25000.0000 [IU] | INTRAMUSCULAR | Status: DC

## 2013-01-09 MED ORDER — EPOETIN ALFA 10000 UNIT/ML IJ SOLN
INTRAMUSCULAR | Status: AC
  Administered 2013-01-09: 5000 [IU]
  Filled 2013-01-09: qty 1

## 2013-01-16 DIAGNOSIS — Z940 Kidney transplant status: Secondary | ICD-10-CM | POA: Diagnosis not present

## 2013-01-16 DIAGNOSIS — Z79899 Other long term (current) drug therapy: Secondary | ICD-10-CM | POA: Diagnosis not present

## 2013-01-16 DIAGNOSIS — I10 Essential (primary) hypertension: Secondary | ICD-10-CM | POA: Diagnosis not present

## 2013-01-23 ENCOUNTER — Encounter (HOSPITAL_COMMUNITY)
Admission: RE | Admit: 2013-01-23 | Discharge: 2013-01-23 | Disposition: A | Discharge status: Home / Self Care | Payer: Medicare Other | Source: Ambulatory Visit | Attending: Nephrology | Admitting: Nephrology

## 2013-01-23 DIAGNOSIS — N183 Chronic kidney disease, stage 3 unspecified: Secondary | ICD-10-CM | POA: Insufficient documentation

## 2013-01-23 DIAGNOSIS — D638 Anemia in other chronic diseases classified elsewhere: Secondary | ICD-10-CM | POA: Insufficient documentation

## 2013-01-23 DIAGNOSIS — Z23 Encounter for immunization: Secondary | ICD-10-CM | POA: Diagnosis not present

## 2013-01-23 LAB — IRON AND TIBC
Saturation Ratios: 21 % (ref 20–55)
TIBC: 263 ug/dL (ref 250–470)

## 2013-01-23 LAB — POCT HEMOGLOBIN-HEMACUE: Hemoglobin: 10.9 g/dL — ABNORMAL LOW (ref 12.0–15.0)

## 2013-01-23 MED ORDER — EPOETIN ALFA 40000 UNIT/ML IJ SOLN: 25000.0000 [IU] | INTRAMUSCULAR | Status: DC

## 2013-01-23 MED ORDER — EPOETIN ALFA 10000 UNIT/ML IJ SOLN
INTRAMUSCULAR | Status: AC
  Administered 2013-01-23: 5000 [IU]
  Filled 2013-01-23: qty 1

## 2013-01-23 MED ORDER — EPOETIN ALFA 20000 UNIT/ML IJ SOLN
INTRAMUSCULAR | Status: AC
  Administered 2013-01-23: 20000 [IU]
  Filled 2013-01-23: qty 1

## 2013-01-31 DIAGNOSIS — Z131 Encounter for screening for diabetes mellitus: Secondary | ICD-10-CM | POA: Diagnosis not present

## 2013-01-31 DIAGNOSIS — I10 Essential (primary) hypertension: Secondary | ICD-10-CM | POA: Diagnosis not present

## 2013-02-01 ENCOUNTER — Other Ambulatory Visit (HOSPITAL_COMMUNITY): Payer: Self-pay | Admitting: *Deleted

## 2013-02-02 ENCOUNTER — Encounter (HOSPITAL_COMMUNITY)
Admission: RE | Admit: 2013-02-02 | Discharge: 2013-02-02 | Disposition: A | Discharge status: Home / Self Care | Payer: Medicare Other | Source: Ambulatory Visit | Attending: Nephrology | Admitting: Nephrology

## 2013-02-02 DIAGNOSIS — D638 Anemia in other chronic diseases classified elsewhere: Secondary | ICD-10-CM | POA: Diagnosis not present

## 2013-02-02 DIAGNOSIS — N183 Chronic kidney disease, stage 3 unspecified: Secondary | ICD-10-CM | POA: Diagnosis not present

## 2013-02-02 LAB — POCT HEMOGLOBIN-HEMACUE: Hemoglobin: 10.9 g/dL — ABNORMAL LOW (ref 12.0–15.0)

## 2013-02-02 MED ORDER — EPOETIN ALFA 10000 UNIT/ML IJ SOLN
INTRAMUSCULAR | Status: AC
  Administered 2013-02-02: 5000 [IU] via SUBCUTANEOUS
  Filled 2013-02-02: qty 1

## 2013-02-02 MED ORDER — EPOETIN ALFA 20000 UNIT/ML IJ SOLN
INTRAMUSCULAR | Status: AC
  Administered 2013-02-02: 20000 [IU] via SUBCUTANEOUS
  Filled 2013-02-02: qty 1

## 2013-02-02 MED ORDER — EPOETIN ALFA 40000 UNIT/ML IJ SOLN: 25000.0000 [IU] | INTRAMUSCULAR | Status: DC

## 2013-02-06 ENCOUNTER — Encounter (HOSPITAL_COMMUNITY): Payer: Medicare Other

## 2013-02-13 ENCOUNTER — Encounter (HOSPITAL_COMMUNITY)
Admission: RE | Admit: 2013-02-13 | Discharge: 2013-02-13 | Disposition: A | Discharge status: Home / Self Care | Payer: Medicare Other | Source: Ambulatory Visit | Attending: Nephrology | Admitting: Nephrology

## 2013-02-13 DIAGNOSIS — N183 Chronic kidney disease, stage 3 unspecified: Secondary | ICD-10-CM | POA: Insufficient documentation

## 2013-02-13 DIAGNOSIS — D638 Anemia in other chronic diseases classified elsewhere: Secondary | ICD-10-CM | POA: Diagnosis not present

## 2013-02-13 MED ORDER — EPOETIN ALFA 40000 UNIT/ML IJ SOLN: 25000.0000 [IU] | INTRAMUSCULAR | Status: DC

## 2013-02-13 MED ORDER — EPOETIN ALFA 20000 UNIT/ML IJ SOLN
INTRAMUSCULAR | Status: AC
  Administered 2013-02-13: 20000 [IU]
  Filled 2013-02-13: qty 1

## 2013-02-13 MED ORDER — EPOETIN ALFA 10000 UNIT/ML IJ SOLN
INTRAMUSCULAR | Status: AC
  Administered 2013-02-13: 5000 [IU]
  Filled 2013-02-13: qty 1

## 2013-02-20 DIAGNOSIS — I10 Essential (primary) hypertension: Secondary | ICD-10-CM | POA: Diagnosis not present

## 2013-02-20 DIAGNOSIS — E875 Hyperkalemia: Secondary | ICD-10-CM | POA: Diagnosis not present

## 2013-02-20 DIAGNOSIS — Z940 Kidney transplant status: Secondary | ICD-10-CM | POA: Diagnosis not present

## 2013-02-20 DIAGNOSIS — D649 Anemia, unspecified: Secondary | ICD-10-CM | POA: Diagnosis not present

## 2013-02-26 ENCOUNTER — Other Ambulatory Visit (HOSPITAL_COMMUNITY): Payer: Self-pay | Admitting: *Deleted

## 2013-02-27 ENCOUNTER — Encounter (HOSPITAL_COMMUNITY)
Admission: RE | Admit: 2013-02-27 | Discharge: 2013-02-27 | Disposition: A | Discharge status: Home / Self Care | Payer: Medicare Other | Source: Ambulatory Visit | Attending: Nephrology | Admitting: Nephrology

## 2013-02-27 DIAGNOSIS — Z940 Kidney transplant status: Secondary | ICD-10-CM | POA: Diagnosis not present

## 2013-02-27 DIAGNOSIS — R7989 Other specified abnormal findings of blood chemistry: Secondary | ICD-10-CM | POA: Diagnosis not present

## 2013-02-27 DIAGNOSIS — Z79899 Other long term (current) drug therapy: Secondary | ICD-10-CM | POA: Diagnosis not present

## 2013-02-27 LAB — IRON AND TIBC: TIBC: 272 ug/dL (ref 250–470)

## 2013-02-27 LAB — HEMOGLOBIN AND HEMATOCRIT, BLOOD: Hemoglobin: 12.4 g/dL (ref 12.0–15.0)

## 2013-02-27 MED ORDER — EPOETIN ALFA 40000 UNIT/ML IJ SOLN: 25000.0000 [IU] | INTRAMUSCULAR | Status: DC

## 2013-03-13 ENCOUNTER — Encounter (HOSPITAL_COMMUNITY)
Admission: RE | Admit: 2013-03-13 | Discharge: 2013-03-13 | Disposition: A | Discharge status: Home / Self Care | Payer: Medicare Other | Source: Ambulatory Visit | Attending: Nephrology | Admitting: Nephrology

## 2013-03-13 MED ORDER — EPOETIN ALFA 40000 UNIT/ML IJ SOLN: 25000.0000 [IU] | INTRAMUSCULAR | Status: DC

## 2013-03-13 MED ORDER — EPOETIN ALFA 10000 UNIT/ML IJ SOLN
INTRAMUSCULAR | Status: AC
  Administered 2013-03-13: 10000 [IU] via SUBCUTANEOUS
  Filled 2013-03-13: qty 1

## 2013-03-13 MED ORDER — EPOETIN ALFA 20000 UNIT/ML IJ SOLN
INTRAMUSCULAR | Status: AC
  Administered 2013-03-13: 20000 [IU] via SUBCUTANEOUS
  Filled 2013-03-13: qty 1

## 2013-03-21 ENCOUNTER — Encounter (HOSPITAL_COMMUNITY): Payer: Self-pay | Admitting: Emergency Medicine

## 2013-03-21 ENCOUNTER — Emergency Department (INDEPENDENT_AMBULATORY_CARE_PROVIDER_SITE_OTHER)
Admission: EM | Admit: 2013-03-21 | Discharge: 2013-03-21 | Disposition: A | Discharge status: Home / Self Care | Payer: Medicare Other | Source: Home / Self Care | Attending: Family Medicine | Admitting: Family Medicine

## 2013-03-21 DIAGNOSIS — M2669 Other specified disorders of temporomandibular joint: Secondary | ICD-10-CM

## 2013-03-21 DIAGNOSIS — M26629 Arthralgia of temporomandibular joint, unspecified side: Secondary | ICD-10-CM

## 2013-03-21 MED ORDER — TRAMADOL HCL 50 MG PO TABS: 50.0000 mg | ORAL_TABLET | Freq: Four times a day (QID) | ORAL | Status: DC | PRN

## 2013-03-21 NOTE — ED Provider Notes (Signed)
CSN: [id number]     Arrival date & time 03/21/13  1603 History   First MD Initiated Contact with Patient 03/21/13 1708     Chief Complaint  Patient presents with  . Headache   (Consider location/radiation/quality/duration/timing/severity/associated sxs/prior Treatment) Patient is a 28 y.o. female presenting with headaches. The history is provided by the patient.  Headache Pain location:  R temporal Quality:  Sharp Radiates to:  Does not radiate Onset quality:  Gradual Duration:  3 days Progression:  Worsening Chronicity:  New Similar to prior headaches: no   Associated symptoms: facial pain   Associated symptoms: no abdominal pain, no back pain, no blurred vision, no dizziness, no fever, no loss of balance and no nausea   Associated symptoms comment:  Grinds teeth at night   Past Medical History  Diagnosis Date  . Chronic kidney disease   . Hypertension   . Hyperlipidemia   . Diabetes mellitus     Pt reports diagnosis in June 2011   Past Surgical History  Procedure Laterality Date  . Kidney transplant  2007  . Thyroid lobectomy    . Renal biopsy Bilateral 2003   Family History  Problem Relation Age of Onset  . Arthritis Mother   . Diabetes Mother   . Hypertension Mother    History  Substance Use Topics  . Smoking status: Never Smoker   . Smokeless tobacco: Never Used  . Alcohol Use: No   OB History   Grav Para Term Preterm Abortions TAB SAB Ect Mult Living                 Review of Systems  Constitutional: Negative.  Negative for fever.  HENT: Negative for facial swelling.   Eyes: Negative for blurred vision.  Respiratory: Negative.   Cardiovascular: Negative.   Gastrointestinal: Negative for nausea and abdominal pain.  Musculoskeletal: Negative for back pain.  Neurological: Positive for headaches. Negative for dizziness and loss of balance.    Allergies  Motrin; Banana; Diphenhydramine hcl; Iron dextran; and Shellfish allergy  Home Medications    Current Outpatient Rx  Name  Route  Sig  Dispense  Refill  . ACCU-CHEK FASTCLIX LANCETS MISC   Does not apply   1 Device by Does not apply route 3 (three) times daily.   1 each   11   . calcium carbonate (TUMS) 500 MG chewable tablet   Oral   Chew 2 tablets by mouth daily.          Marland Kitchen epoetin alfa (PROCRIT) 02725 UNIT/ML injection   Subcutaneous   Inject 30,000 Units into the skin every 14 (fourteen) days. Next injection due 07/09/12.         . flintstones complete (FLINTSTONES) 60 MG chewable tablet   Oral   Chew 1 tablet by mouth daily.         . Glucose Blood (ASCENSIA AUTODISC TEST) DISK      Test blood sugar four times daily          . HYDROcodone-acetaminophen (NORCO) 10-325 MG per tablet   Oral   Take 1 tablet by mouth every 6 (six) hours as needed.         . hydrOXYzine (ATARAX) 25 MG tablet   Oral   Take 25 mg by mouth 2 (two) times daily as needed for itching.          . Insulin Pen Needle (BD PEN NEEDLE NANO U/F) 32G X 4 MM MISC  Please give syrnges that hold 100 units.  Check blood sugars twice daily.   100 each   5   . Insulin Syringe-Needle U-100 30G X 1/2" 0.5 ML MISC      Supply quantity for three shots per day dosing   1 each   11     Please give BD Brand.   . Insulin Syringes, Disposable, U-100 1 ML MISC      BD Insulin syringe 1 mL, 31 gauge, 6 mm needle   90 each   11   . lisinopril-hydrochlorothiazide (PRINZIDE,ZESTORETIC) 20-25 MG per tablet   Oral   Take 1 tablet by mouth daily.          Marland Kitchen loratadine (CLARITIN) 10 MG tablet   Oral   Take 10 mg by mouth daily.         . mycophenolate (MYFORTIC) 360 MG TBEC   Oral   Take 780 mg by mouth 2 (two) times daily.         . predniSONE (DELTASONE) 5 MG tablet   Oral   Take 5 mg by mouth at bedtime.          . ranitidine (ZANTAC) 150 MG tablet   Oral   Take 150 mg by mouth daily as needed for heartburn.         . simvastatin (ZOCOR) 20 MG tablet   Oral    Take 20 mg by mouth every evening.         . sulfamethoxazole-trimethoprim (BACTRIM,SEPTRA) 400-80 MG per tablet   Oral   Take 1 tablet by mouth every other day.          . tacrolimus (PROGRAF) 1 MG capsule   Oral   Take 2 mg by mouth 2 (two) times daily.          . B-D INS SYRINGE 0.5CC/31GX5/16 31G X 5/16" 0.5 ML MISC      USE AS DIRECTED   100 each   5     Rx has expired - unused refills remain   . Docusate Calcium (STOOL SOFTENER PO)   Oral   Take 1 tablet by mouth daily as needed (contipation).          . ondansetron (ZOFRAN) 4 MG tablet   Oral   Take 4 mg by mouth every 12 (twelve) hours as needed for nausea.         . traMADol (ULTRAM) 50 MG tablet   Oral   Take 1 tablet (50 mg total) by mouth every 6 (six) hours as needed.   15 tablet   0    BP 136/94  Pulse 87  Temp(Src) 98.6 F (37 C) (Oral)  Resp 18  SpO2 98% Physical Exam  Nursing note and vitals reviewed. Constitutional: She is oriented to person, place, and time. She appears well-developed and well-nourished.  HENT:  Head: Normocephalic.  Right Ear: External ear normal.  Left Ear: External ear normal.  Mouth/Throat: Uvula is midline and oropharynx is clear and moist.    Eyes: Conjunctivae are normal. Pupils are equal, round, and reactive to light.  Neck: Normal range of motion. Neck supple.  Neurological: She is alert and oriented to person, place, and time.  Skin: Skin is warm and dry.    ED Course  Procedures (including critical care time) Labs Review Labs Reviewed - No data to display Imaging Review No results found.  EKG Interpretation    Date/Time:    Ventricular Rate:  PR Interval:    QRS Duration:   QT Interval:    QTC Calculation:   R Axis:     Text Interpretation:              MDM      Billy Fischer, MD 03/21/13 1747

## 2013-03-21 NOTE — ED Notes (Signed)
Pt. said she has never taken this before and would have to ask her doctors that did her kidney transplant. I told her Dr. Juventino Slovak was aware of her transplant and prescribed what he feels is safe.  She said it won't help with her pain, like the Hydrocodone she took last night. I asked how she knows this if she has never taken it before.  She said she knows the Hydrocodone is OK to take and it helped her pain last night.  I asked Dr. Juventino Slovak and he said he was not changing her Rx.  She can call Dr. Lorrene Reid about it.  I told pt. This and she said this was a wasted visit and she will go to the ED.

## 2013-03-21 NOTE — ED Notes (Signed)
C/o headache on R forehead onset Sunday night 1/4 and numbness in the R side of her face onset 2 days ago.  R side of her face appears swollen.  States she got her teeth fixed in October.  Hx. Kidney transplant.

## 2013-03-21 NOTE — Discharge Instructions (Signed)
See your dentist for further eval.

## 2013-03-23 DIAGNOSIS — D631 Anemia in chronic kidney disease: Secondary | ICD-10-CM | POA: Diagnosis not present

## 2013-03-23 DIAGNOSIS — Z940 Kidney transplant status: Secondary | ICD-10-CM | POA: Diagnosis not present

## 2013-03-23 DIAGNOSIS — N039 Chronic nephritic syndrome with unspecified morphologic changes: Secondary | ICD-10-CM | POA: Diagnosis not present

## 2013-03-23 DIAGNOSIS — Z79899 Other long term (current) drug therapy: Secondary | ICD-10-CM | POA: Diagnosis not present

## 2013-03-27 ENCOUNTER — Encounter (HOSPITAL_COMMUNITY)
Admission: RE | Admit: 2013-03-27 | Discharge: 2013-03-27 | Disposition: A | Discharge status: Home / Self Care | Payer: Medicare Other | Source: Ambulatory Visit | Attending: Nephrology | Admitting: Nephrology

## 2013-03-27 ENCOUNTER — Encounter (HOSPITAL_COMMUNITY): Payer: Medicare Other

## 2013-03-27 DIAGNOSIS — N183 Chronic kidney disease, stage 3 unspecified: Secondary | ICD-10-CM | POA: Insufficient documentation

## 2013-03-27 DIAGNOSIS — D638 Anemia in other chronic diseases classified elsewhere: Secondary | ICD-10-CM | POA: Insufficient documentation

## 2013-03-27 LAB — IRON AND TIBC
Iron: 57 ug/dL (ref 42–135)
Saturation Ratios: 19 % — ABNORMAL LOW (ref 20–55)
TIBC: 297 ug/dL (ref 250–470)
UIBC: 240 ug/dL (ref 125–400)

## 2013-03-27 LAB — FERRITIN: Ferritin: 1262 ng/mL — ABNORMAL HIGH (ref 10–291)

## 2013-03-27 LAB — POCT HEMOGLOBIN-HEMACUE: HEMOGLOBIN: 11.7 g/dL — AB (ref 12.0–15.0)

## 2013-03-27 MED ORDER — EPOETIN ALFA 40000 UNIT/ML IJ SOLN
25000.0000 [IU] | INTRAMUSCULAR | Status: DC
  Administered 2013-03-27: 25000 [IU] via SUBCUTANEOUS

## 2013-03-27 MED ORDER — EPOETIN ALFA 10000 UNIT/ML IJ SOLN
INTRAMUSCULAR | Status: DC
Start: 2013-03-27 — End: 2013-03-28
  Filled 2013-03-27: qty 1

## 2013-03-27 MED ORDER — EPOETIN ALFA 20000 UNIT/ML IJ SOLN
INTRAMUSCULAR | Status: AC
  Filled 2013-03-27: qty 1

## 2013-03-28 MED FILL — Epoetin Alfa Inj 10000 Unit/ML: INTRAMUSCULAR | Qty: 1 | Status: AC

## 2013-03-28 MED FILL — Epoetin Alfa Inj 20000 Unit/ML: INTRAMUSCULAR | Qty: 1 | Status: AC

## 2013-04-03 DIAGNOSIS — E119 Type 2 diabetes mellitus without complications: Secondary | ICD-10-CM | POA: Diagnosis not present

## 2013-04-03 DIAGNOSIS — Z940 Kidney transplant status: Secondary | ICD-10-CM | POA: Diagnosis not present

## 2013-04-03 DIAGNOSIS — I10 Essential (primary) hypertension: Secondary | ICD-10-CM | POA: Diagnosis not present

## 2013-04-10 ENCOUNTER — Encounter (HOSPITAL_COMMUNITY)
Admission: RE | Admit: 2013-04-10 | Discharge: 2013-04-10 | Disposition: A | Discharge status: Home / Self Care | Payer: Medicare Other | Source: Ambulatory Visit | Attending: Nephrology | Admitting: Nephrology

## 2013-04-10 DIAGNOSIS — D638 Anemia in other chronic diseases classified elsewhere: Secondary | ICD-10-CM | POA: Diagnosis not present

## 2013-04-10 DIAGNOSIS — N183 Chronic kidney disease, stage 3 unspecified: Secondary | ICD-10-CM | POA: Diagnosis not present

## 2013-04-10 LAB — POCT HEMOGLOBIN-HEMACUE: Hemoglobin: 12.1 g/dL (ref 12.0–15.0)

## 2013-04-10 MED ORDER — EPOETIN ALFA 40000 UNIT/ML IJ SOLN: 25000.0000 [IU] | INTRAMUSCULAR | Status: DC

## 2013-04-23 ENCOUNTER — Other Ambulatory Visit (HOSPITAL_COMMUNITY): Payer: Self-pay | Admitting: *Deleted

## 2013-04-24 ENCOUNTER — Encounter (HOSPITAL_COMMUNITY)
Admission: RE | Admit: 2013-04-24 | Discharge: 2013-04-24 | Disposition: A | Discharge status: Home / Self Care | Payer: Medicare Other | Source: Ambulatory Visit | Attending: Nephrology | Admitting: Nephrology

## 2013-04-24 DIAGNOSIS — N183 Chronic kidney disease, stage 3 unspecified: Secondary | ICD-10-CM | POA: Diagnosis not present

## 2013-04-24 DIAGNOSIS — D638 Anemia in other chronic diseases classified elsewhere: Secondary | ICD-10-CM | POA: Diagnosis not present

## 2013-04-24 DIAGNOSIS — Z940 Kidney transplant status: Secondary | ICD-10-CM | POA: Diagnosis not present

## 2013-04-24 DIAGNOSIS — Z79899 Other long term (current) drug therapy: Secondary | ICD-10-CM | POA: Diagnosis not present

## 2013-04-24 LAB — IRON AND TIBC
IRON: 73 ug/dL (ref 42–135)
SATURATION RATIOS: 27 % (ref 20–55)
TIBC: 269 ug/dL (ref 250–470)
UIBC: 196 ug/dL (ref 125–400)

## 2013-04-24 LAB — POCT HEMOGLOBIN-HEMACUE: Hemoglobin: 11.3 g/dL — ABNORMAL LOW (ref 12.0–15.0)

## 2013-04-24 LAB — FERRITIN: FERRITIN: 877 ng/mL — AB (ref 10–291)

## 2013-04-24 MED ORDER — EPOETIN ALFA 40000 UNIT/ML IJ SOLN: 22500.0000 [IU] | INTRAMUSCULAR | Status: DC

## 2013-04-24 MED ORDER — EPOETIN ALFA 10000 UNIT/ML IJ SOLN
INTRAMUSCULAR | Status: AC
  Administered 2013-04-24: 2500 [IU] via SUBCUTANEOUS
  Filled 2013-04-24: qty 1

## 2013-04-24 MED ORDER — EPOETIN ALFA 20000 UNIT/ML IJ SOLN
INTRAMUSCULAR | Status: AC
  Administered 2013-04-24: 20000 [IU] via SUBCUTANEOUS
  Filled 2013-04-24: qty 1

## 2013-04-25 DIAGNOSIS — E139 Other specified diabetes mellitus without complications: Secondary | ICD-10-CM | POA: Diagnosis not present

## 2013-04-25 DIAGNOSIS — Z940 Kidney transplant status: Secondary | ICD-10-CM | POA: Diagnosis not present

## 2013-04-25 DIAGNOSIS — I10 Essential (primary) hypertension: Secondary | ICD-10-CM | POA: Diagnosis not present

## 2013-04-25 DIAGNOSIS — D649 Anemia, unspecified: Secondary | ICD-10-CM | POA: Diagnosis not present

## 2013-05-08 ENCOUNTER — Encounter (HOSPITAL_COMMUNITY)
Admission: RE | Admit: 2013-05-08 | Discharge: 2013-05-08 | Disposition: A | Discharge status: Home / Self Care | Payer: Medicare Other | Source: Ambulatory Visit | Attending: Nephrology | Admitting: Nephrology

## 2013-05-08 DIAGNOSIS — N183 Chronic kidney disease, stage 3 unspecified: Secondary | ICD-10-CM | POA: Diagnosis not present

## 2013-05-08 DIAGNOSIS — D638 Anemia in other chronic diseases classified elsewhere: Secondary | ICD-10-CM | POA: Diagnosis not present

## 2013-05-08 LAB — POCT HEMOGLOBIN-HEMACUE: Hemoglobin: 12.5 g/dL (ref 12.0–15.0)

## 2013-05-08 MED ORDER — EPOETIN ALFA 40000 UNIT/ML IJ SOLN: 22500.0000 [IU] | INTRAMUSCULAR | Status: DC

## 2013-05-22 ENCOUNTER — Encounter (HOSPITAL_COMMUNITY)
Admission: RE | Admit: 2013-05-22 | Discharge: 2013-05-22 | Disposition: A | Discharge status: Home / Self Care | Payer: Medicare Other | Source: Ambulatory Visit | Attending: Nephrology | Admitting: Nephrology

## 2013-05-22 DIAGNOSIS — D638 Anemia in other chronic diseases classified elsewhere: Secondary | ICD-10-CM | POA: Diagnosis not present

## 2013-05-22 DIAGNOSIS — N183 Chronic kidney disease, stage 3 unspecified: Secondary | ICD-10-CM | POA: Diagnosis not present

## 2013-05-22 LAB — IRON AND TIBC
Iron: 45 ug/dL (ref 42–135)
Saturation Ratios: 17 % — ABNORMAL LOW (ref 20–55)
TIBC: 261 ug/dL (ref 250–470)
UIBC: 216 ug/dL (ref 125–400)

## 2013-05-22 LAB — POCT HEMOGLOBIN-HEMACUE: HEMOGLOBIN: 10.8 g/dL — AB (ref 12.0–15.0)

## 2013-05-22 LAB — FERRITIN: FERRITIN: 1042 ng/mL — AB (ref 10–291)

## 2013-05-22 MED ORDER — EPOETIN ALFA 40000 UNIT/ML IJ SOLN: 22500.0000 [IU] | INTRAMUSCULAR | Status: DC

## 2013-05-22 MED ORDER — EPOETIN ALFA 20000 UNIT/ML IJ SOLN
INTRAMUSCULAR | Status: AC
  Administered 2013-05-22: 20000 [IU]
  Filled 2013-05-22: qty 1

## 2013-05-22 MED ORDER — EPOETIN ALFA 10000 UNIT/ML IJ SOLN
INTRAMUSCULAR | Status: AC
  Administered 2013-05-22: 2500 [IU]
  Filled 2013-05-22: qty 1

## 2013-06-05 ENCOUNTER — Encounter (HOSPITAL_COMMUNITY)
Admission: RE | Admit: 2013-06-05 | Discharge: 2013-06-05 | Disposition: A | Discharge status: Home / Self Care | Payer: Medicare Other | Source: Ambulatory Visit | Attending: Nephrology | Admitting: Nephrology

## 2013-06-05 DIAGNOSIS — N183 Chronic kidney disease, stage 3 unspecified: Secondary | ICD-10-CM | POA: Diagnosis not present

## 2013-06-05 DIAGNOSIS — D638 Anemia in other chronic diseases classified elsewhere: Secondary | ICD-10-CM | POA: Diagnosis not present

## 2013-06-05 LAB — POCT HEMOGLOBIN-HEMACUE: Hemoglobin: 11.1 g/dL — ABNORMAL LOW (ref 12.0–15.0)

## 2013-06-05 MED ORDER — EPOETIN ALFA 40000 UNIT/ML IJ SOLN: 22500.0000 [IU] | INTRAMUSCULAR | Status: DC

## 2013-06-05 MED ORDER — EPOETIN ALFA 10000 UNIT/ML IJ SOLN
INTRAMUSCULAR | Status: AC
  Administered 2013-06-05: 2500 [IU] via SUBCUTANEOUS
  Filled 2013-06-05: qty 1

## 2013-06-05 MED ORDER — EPOETIN ALFA 20000 UNIT/ML IJ SOLN
INTRAMUSCULAR | Status: AC
  Administered 2013-06-05: 20000 [IU] via SUBCUTANEOUS
  Filled 2013-06-05: qty 1

## 2013-06-19 ENCOUNTER — Encounter (HOSPITAL_COMMUNITY)
Admission: RE | Admit: 2013-06-19 | Discharge: 2013-06-19 | Disposition: A | Discharge status: Home / Self Care | Payer: Medicare Other | Source: Ambulatory Visit | Attending: Nephrology | Admitting: Nephrology

## 2013-06-19 DIAGNOSIS — N183 Chronic kidney disease, stage 3 unspecified: Secondary | ICD-10-CM | POA: Insufficient documentation

## 2013-06-19 DIAGNOSIS — D638 Anemia in other chronic diseases classified elsewhere: Secondary | ICD-10-CM | POA: Insufficient documentation

## 2013-06-19 LAB — FERRITIN: Ferritin: 1400 ng/mL — ABNORMAL HIGH (ref 10–291)

## 2013-06-19 LAB — POCT HEMOGLOBIN-HEMACUE: Hemoglobin: 12.2 g/dL (ref 12.0–15.0)

## 2013-06-19 LAB — IRON AND TIBC
Iron: 75 ug/dL (ref 42–135)
Saturation Ratios: 26 % (ref 20–55)
TIBC: 294 ug/dL (ref 250–470)
UIBC: 219 ug/dL (ref 125–400)

## 2013-06-19 MED ORDER — EPOETIN ALFA 40000 UNIT/ML IJ SOLN: 22500.0000 [IU] | INTRAMUSCULAR | Status: DC

## 2013-06-21 DIAGNOSIS — Z79899 Other long term (current) drug therapy: Secondary | ICD-10-CM | POA: Diagnosis not present

## 2013-06-21 DIAGNOSIS — D649 Anemia, unspecified: Secondary | ICD-10-CM | POA: Diagnosis not present

## 2013-06-21 DIAGNOSIS — Z940 Kidney transplant status: Secondary | ICD-10-CM | POA: Diagnosis not present

## 2013-07-03 ENCOUNTER — Encounter (HOSPITAL_COMMUNITY)
Admission: RE | Admit: 2013-07-03 | Discharge: 2013-07-03 | Disposition: A | Discharge status: Home / Self Care | Payer: Medicare Other | Source: Ambulatory Visit | Attending: Nephrology | Admitting: Nephrology

## 2013-07-03 DIAGNOSIS — E119 Type 2 diabetes mellitus without complications: Secondary | ICD-10-CM | POA: Diagnosis not present

## 2013-07-03 DIAGNOSIS — I10 Essential (primary) hypertension: Secondary | ICD-10-CM | POA: Diagnosis not present

## 2013-07-03 LAB — POCT HEMOGLOBIN-HEMACUE: HEMOGLOBIN: 11.7 g/dL — AB (ref 12.0–15.0)

## 2013-07-03 MED ORDER — EPOETIN ALFA 20000 UNIT/ML IJ SOLN
INTRAMUSCULAR | Status: AC
  Administered 2013-07-03: 20000 [IU] via SUBCUTANEOUS
  Filled 2013-07-03: qty 1

## 2013-07-03 MED ORDER — EPOETIN ALFA 40000 UNIT/ML IJ SOLN: 22500.0000 [IU] | INTRAMUSCULAR | Status: DC

## 2013-07-03 MED ORDER — EPOETIN ALFA 10000 UNIT/ML IJ SOLN
INTRAMUSCULAR | Status: AC
  Administered 2013-07-03: 2500 [IU] via SUBCUTANEOUS
  Filled 2013-07-03: qty 1

## 2013-07-07 ENCOUNTER — Other Ambulatory Visit: Payer: Self-pay

## 2013-07-07 ENCOUNTER — Encounter (HOSPITAL_COMMUNITY): Payer: Self-pay | Admitting: Emergency Medicine

## 2013-07-07 ENCOUNTER — Emergency Department (HOSPITAL_COMMUNITY)
Admission: EM | Admit: 2013-07-07 | Discharge: 2013-07-07 | Disposition: A | Discharge status: Home / Self Care | Payer: Medicare Other | Attending: Emergency Medicine | Admitting: Emergency Medicine

## 2013-07-07 DIAGNOSIS — E0789 Other specified disorders of thyroid: Secondary | ICD-10-CM | POA: Diagnosis not present

## 2013-07-07 DIAGNOSIS — Z79899 Other long term (current) drug therapy: Secondary | ICD-10-CM | POA: Diagnosis not present

## 2013-07-07 DIAGNOSIS — R739 Hyperglycemia, unspecified: Secondary | ICD-10-CM

## 2013-07-07 DIAGNOSIS — N189 Chronic kidney disease, unspecified: Secondary | ICD-10-CM | POA: Insufficient documentation

## 2013-07-07 DIAGNOSIS — E785 Hyperlipidemia, unspecified: Secondary | ICD-10-CM | POA: Insufficient documentation

## 2013-07-07 DIAGNOSIS — E119 Type 2 diabetes mellitus without complications: Secondary | ICD-10-CM | POA: Diagnosis not present

## 2013-07-07 DIAGNOSIS — Z9889 Other specified postprocedural states: Secondary | ICD-10-CM | POA: Diagnosis not present

## 2013-07-07 DIAGNOSIS — Z9689 Presence of other specified functional implants: Secondary | ICD-10-CM | POA: Diagnosis not present

## 2013-07-07 DIAGNOSIS — I10 Essential (primary) hypertension: Secondary | ICD-10-CM | POA: Diagnosis not present

## 2013-07-07 LAB — I-STAT CHEM 8, ED
BUN: 37 mg/dL — AB (ref 6–23)
CHLORIDE: 100 meq/L (ref 96–112)
Calcium, Ion: 1.12 mmol/L (ref 1.12–1.23)
Creatinine, Ser: 1.6 mg/dL — ABNORMAL HIGH (ref 0.50–1.10)
Glucose, Bld: 480 mg/dL — ABNORMAL HIGH (ref 70–99)
HEMATOCRIT: 31 % — AB (ref 36.0–46.0)
Hemoglobin: 10.5 g/dL — ABNORMAL LOW (ref 12.0–15.0)
Potassium: 4.7 mEq/L (ref 3.7–5.3)
Sodium: 133 mEq/L — ABNORMAL LOW (ref 137–147)
TCO2: 19 mmol/L (ref 0–100)

## 2013-07-07 LAB — I-STAT TROPONIN, ED: TROPONIN I, POC: 0.01 ng/mL (ref 0.00–0.08)

## 2013-07-07 NOTE — ED Provider Notes (Signed)
CSN: [id number]     Arrival date & time 07/07/13  [id number] History   First MD Initiated Contact with Patient 07/07/13 0802     Chief Complaint  Patient presents with  . Hypertension      HPI Reports waking up this am with diaphoresis and htn. bp 180/101 this am, pt took clonidine. Had bp meds changed within past month. Denies any cp but reports having palpitations earlier this am. ekg done at triage, no acute distress noted.  Patient stated that she went out last night and had sesame seed chicken at a [employer].  Past Medical History  Diagnosis Date  . Chronic kidney disease   . Hypertension   . Hyperlipidemia   . Diabetes mellitus     Pt reports diagnosis in June 2011   Past Surgical History  Procedure Laterality Date  . Kidney transplant  2007  . Thyroid lobectomy    . Renal biopsy Bilateral 2003   Family History  Problem Relation Age of Onset  . Arthritis Mother   . Diabetes Mother   . Hypertension Mother    History  Substance Use Topics  . Smoking status: Never Smoker   . Smokeless tobacco: Never Used  . Alcohol Use: No   OB History   Grav Para Term Preterm Abortions TAB SAB Ect Mult Living                 Review of Systems  All other systems reviewed and are negative.     Allergies  Motrin; Banana; Diphenhydramine hcl; Iron dextran; and Shellfish allergy  Home Medications   Prior to Admission medications   Medication Sig Start Date End Date Taking? Authorizing Provider  calcium carbonate (TUMS) 500 MG chewable tablet Chew 2 tablets by mouth daily.    Yes Historical Provider, MD  Docusate Calcium (STOOL SOFTENER PO) Take 1 tablet by mouth daily as needed (contipation).    Yes Historical Provider, MD  flintstones complete (FLINTSTONES) 60 MG chewable tablet Chew 1 tablet by mouth daily.   Yes Historical Provider, MD  hydrOXYzine (ATARAX) 25 MG tablet Take 25 mg by mouth 2 (two) times daily as needed for itching.    Yes Historical Provider, MD   lisinopril-hydrochlorothiazide (PRINZIDE,ZESTORETIC) 20-25 MG per tablet Take 1 tablet by mouth daily.    Yes Historical Provider, MD  mycophenolate (MYFORTIC) 360 MG TBEC Take 780 mg by mouth 2 (two) times daily.   Yes Historical Provider, MD  predniSONE (DELTASONE) 5 MG tablet Take 5 mg by mouth at bedtime.    Yes Historical Provider, MD  ranitidine (ZANTAC) 150 MG tablet Take 150 mg by mouth daily as needed for heartburn.   Yes Historical Provider, MD  simvastatin (ZOCOR) 20 MG tablet Take 20 mg by mouth every evening.   Yes Historical Provider, MD  sulfamethoxazole-trimethoprim (BACTRIM,SEPTRA) 400-80 MG per tablet Take 1 tablet by mouth every other day.    Yes Historical Provider, MD  tacrolimus (PROGRAF) 1 MG capsule Take 2 mg by mouth 2 (two) times daily.    Yes Historical Provider, MD   BP 119/88  Pulse 84  Temp(Src) 98.1 F (36.7 C) (Oral)  Resp 18  SpO2 100% Physical Exam  Nursing note and vitals reviewed. Constitutional: She is oriented to person, place, and time. She appears well-developed and well-nourished. No distress.  HENT:  Head: Normocephalic and atraumatic.  Eyes: Pupils are equal, round, and reactive to light.  Neck: Normal range of motion.  Cardiovascular: Normal rate and  intact distal pulses.   Pulmonary/Chest: No respiratory distress.  Abdominal: Normal appearance. She exhibits no distension. There is no tenderness. There is no rebound.  Musculoskeletal: Normal range of motion.  Neurological: She is alert and oriented to person, place, and time. No cranial nerve deficit.  Skin: Skin is warm and dry. No rash noted.  Psychiatric: She has a normal mood and affect. Her behavior is normal.    ED Course  Procedures (including critical care time) Labs Review Labs Reviewed  I-STAT CHEM 8, ED - Abnormal; Notable for the following:    Sodium 133 (*)    BUN 37 (*)    Creatinine, Ser 1.60 (*)    Glucose, Bld 480 (*)    Hemoglobin 10.5 (*)    HCT 31.0 (*)    All  other components within normal limits  I-STAT TROPOININ, ED    Repeat blood sugar showed a decrease to 370  After treatment in the ED the patient feels back to baseline and wants to go home.  Imaging Review No results found.   Date: 07/07/2013  Rate: 81  Rhythm: normal sinus rhythm  QRS Axis: normal  Intervals: normal  ST/T Wave abnormalities: normal  Conduction Disutrbances: none  Narrative Interpretation: unremarkable      MDM   Final diagnoses:  Hyperglycemia        Dot Lanes, MD 07/07/13 1255

## 2013-07-07 NOTE — ED Notes (Signed)
MD at bedside. 

## 2013-07-07 NOTE — ED Notes (Signed)
Reports waking up this am with diaphoresis and htn. bp 180/101 this am, pt took clonidine. Had bp meds changed within past month. Denies any cp but reports having palpitations earlier this am. ekg done at triage, no acute distress noted.

## 2013-07-07 NOTE — ED Notes (Signed)
Patient has insulin pump in place.

## 2013-07-07 NOTE — Discharge Instructions (Signed)

## 2013-07-07 NOTE — ED Notes (Signed)
Patient refused to let nurse take her blood sugar. She stated, "I just want to go home. I didn't come here for my blood sugar. I know it is going to be high." Patient agreed to take her blood sugar on her own meter. Patient's blood sugar was 368. Dr. Audie Pinto notified and stated he would get her home.

## 2013-07-16 ENCOUNTER — Other Ambulatory Visit (HOSPITAL_COMMUNITY): Payer: Self-pay | Admitting: *Deleted

## 2013-07-17 ENCOUNTER — Encounter (HOSPITAL_COMMUNITY)
Admission: RE | Admit: 2013-07-17 | Discharge: 2013-07-17 | Disposition: A | Discharge status: Home / Self Care | Payer: Medicare Other | Source: Ambulatory Visit | Attending: Nephrology | Admitting: Nephrology

## 2013-07-17 DIAGNOSIS — N183 Chronic kidney disease, stage 3 unspecified: Secondary | ICD-10-CM | POA: Diagnosis not present

## 2013-07-17 DIAGNOSIS — D638 Anemia in other chronic diseases classified elsewhere: Secondary | ICD-10-CM | POA: Insufficient documentation

## 2013-07-17 LAB — IRON AND TIBC
IRON: 66 ug/dL (ref 42–135)
Saturation Ratios: 25 % (ref 20–55)
TIBC: 269 ug/dL (ref 250–470)
UIBC: 203 ug/dL (ref 125–400)

## 2013-07-17 LAB — FERRITIN: FERRITIN: 1232 ng/mL — AB (ref 10–291)

## 2013-07-17 LAB — POCT HEMOGLOBIN-HEMACUE: Hemoglobin: 10.9 g/dL — ABNORMAL LOW (ref 12.0–15.0)

## 2013-07-17 MED ORDER — EPOETIN ALFA 40000 UNIT/ML IJ SOLN: 22500.0000 [IU] | INTRAMUSCULAR | Status: DC

## 2013-07-17 MED ORDER — EPOETIN ALFA 10000 UNIT/ML IJ SOLN
INTRAMUSCULAR | Status: AC
  Administered 2013-07-17: 2500 [IU] via SUBCUTANEOUS
  Filled 2013-07-17: qty 1

## 2013-07-17 MED ORDER — EPOETIN ALFA 20000 UNIT/ML IJ SOLN
INTRAMUSCULAR | Status: AC
  Administered 2013-07-17: 20000 [IU] via SUBCUTANEOUS
  Filled 2013-07-17: qty 1

## 2013-07-31 ENCOUNTER — Encounter (HOSPITAL_COMMUNITY)
Admission: RE | Admit: 2013-07-31 | Discharge: 2013-07-31 | Disposition: A | Discharge status: Home / Self Care | Payer: Medicare Other | Source: Ambulatory Visit | Attending: Nephrology | Admitting: Nephrology

## 2013-07-31 DIAGNOSIS — N2581 Secondary hyperparathyroidism of renal origin: Secondary | ICD-10-CM | POA: Diagnosis not present

## 2013-07-31 DIAGNOSIS — Z940 Kidney transplant status: Secondary | ICD-10-CM | POA: Diagnosis not present

## 2013-07-31 DIAGNOSIS — D649 Anemia, unspecified: Secondary | ICD-10-CM | POA: Diagnosis not present

## 2013-07-31 DIAGNOSIS — Z79899 Other long term (current) drug therapy: Secondary | ICD-10-CM | POA: Diagnosis not present

## 2013-07-31 DIAGNOSIS — E785 Hyperlipidemia, unspecified: Secondary | ICD-10-CM | POA: Diagnosis not present

## 2013-07-31 LAB — POCT HEMOGLOBIN-HEMACUE: HEMOGLOBIN: 12.1 g/dL (ref 12.0–15.0)

## 2013-07-31 MED ORDER — EPOETIN ALFA 40000 UNIT/ML IJ SOLN: 22500.0000 [IU] | INTRAMUSCULAR | Status: DC

## 2013-08-01 DIAGNOSIS — E139 Other specified diabetes mellitus without complications: Secondary | ICD-10-CM | POA: Diagnosis not present

## 2013-08-01 DIAGNOSIS — D649 Anemia, unspecified: Secondary | ICD-10-CM | POA: Diagnosis not present

## 2013-08-01 DIAGNOSIS — I10 Essential (primary) hypertension: Secondary | ICD-10-CM | POA: Diagnosis not present

## 2013-08-01 DIAGNOSIS — Z940 Kidney transplant status: Secondary | ICD-10-CM | POA: Diagnosis not present

## 2013-08-14 ENCOUNTER — Encounter (HOSPITAL_COMMUNITY)
Admission: RE | Admit: 2013-08-14 | Discharge: 2013-08-14 | Disposition: A | Discharge status: Home / Self Care | Payer: Medicare Other | Source: Ambulatory Visit | Attending: Nephrology | Admitting: Nephrology

## 2013-08-14 DIAGNOSIS — N183 Chronic kidney disease, stage 3 unspecified: Secondary | ICD-10-CM | POA: Diagnosis not present

## 2013-08-14 DIAGNOSIS — D638 Anemia in other chronic diseases classified elsewhere: Secondary | ICD-10-CM | POA: Insufficient documentation

## 2013-08-14 DIAGNOSIS — Z940 Kidney transplant status: Secondary | ICD-10-CM | POA: Diagnosis not present

## 2013-08-14 DIAGNOSIS — D649 Anemia, unspecified: Secondary | ICD-10-CM | POA: Diagnosis not present

## 2013-08-14 DIAGNOSIS — Z79899 Other long term (current) drug therapy: Secondary | ICD-10-CM | POA: Diagnosis not present

## 2013-08-14 LAB — IRON AND TIBC
Iron: 54 ug/dL (ref 42–135)
Saturation Ratios: 21 % (ref 20–55)
TIBC: 261 ug/dL (ref 250–470)
UIBC: 207 ug/dL (ref 125–400)

## 2013-08-14 LAB — FERRITIN: FERRITIN: 1176 ng/mL — AB (ref 10–291)

## 2013-08-14 LAB — POCT HEMOGLOBIN-HEMACUE: Hemoglobin: 10.7 g/dL — ABNORMAL LOW (ref 12.0–15.0)

## 2013-08-14 MED ORDER — EPOETIN ALFA 10000 UNIT/ML IJ SOLN
INTRAMUSCULAR | Status: AC
  Administered 2013-08-14: 2500 [IU] via SUBCUTANEOUS
  Filled 2013-08-14: qty 1

## 2013-08-14 MED ORDER — EPOETIN ALFA 20000 UNIT/ML IJ SOLN
INTRAMUSCULAR | Status: AC
  Administered 2013-08-14: 20000 [IU] via SUBCUTANEOUS
  Filled 2013-08-14: qty 1

## 2013-08-14 MED ORDER — EPOETIN ALFA 40000 UNIT/ML IJ SOLN: 22500.0000 [IU] | INTRAMUSCULAR | Status: DC

## 2013-08-28 ENCOUNTER — Encounter (HOSPITAL_COMMUNITY)
Admission: RE | Admit: 2013-08-28 | Discharge: 2013-08-28 | Disposition: A | Discharge status: Home / Self Care | Payer: Medicare Other | Source: Ambulatory Visit | Attending: Nephrology | Admitting: Nephrology

## 2013-08-28 DIAGNOSIS — D638 Anemia in other chronic diseases classified elsewhere: Secondary | ICD-10-CM | POA: Diagnosis not present

## 2013-08-28 MED ORDER — EPOETIN ALFA 20000 UNIT/ML IJ SOLN
INTRAMUSCULAR | Status: AC
  Administered 2013-08-28: 20000 [IU]
  Filled 2013-08-28: qty 1

## 2013-08-28 MED ORDER — EPOETIN ALFA 10000 UNIT/ML IJ SOLN
INTRAMUSCULAR | Status: AC
  Administered 2013-08-28: 2500 [IU]
  Filled 2013-08-28: qty 1

## 2013-08-28 MED ORDER — EPOETIN ALFA 40000 UNIT/ML IJ SOLN: 22500.0000 [IU] | INTRAMUSCULAR | Status: DC

## 2013-08-29 LAB — POCT HEMOGLOBIN-HEMACUE: Hemoglobin: 11 g/dL — ABNORMAL LOW (ref 12.0–15.0)

## 2013-09-13 ENCOUNTER — Encounter (HOSPITAL_COMMUNITY): Payer: Medicare Other

## 2013-09-20 ENCOUNTER — Encounter (HOSPITAL_COMMUNITY)
Admission: RE | Admit: 2013-09-20 | Discharge: 2013-09-20 | Disposition: A | Discharge status: Home / Self Care | Payer: Medicare Other | Source: Ambulatory Visit | Attending: Nephrology | Admitting: Nephrology

## 2013-09-20 DIAGNOSIS — N183 Chronic kidney disease, stage 3 unspecified: Secondary | ICD-10-CM | POA: Diagnosis not present

## 2013-09-20 DIAGNOSIS — D638 Anemia in other chronic diseases classified elsewhere: Secondary | ICD-10-CM | POA: Insufficient documentation

## 2013-09-20 LAB — IRON AND TIBC
Iron: 50 ug/dL (ref 42–135)
Saturation Ratios: 20 % (ref 20–55)
TIBC: 245 ug/dL — ABNORMAL LOW (ref 250–470)
UIBC: 195 ug/dL (ref 125–400)

## 2013-09-20 LAB — POCT HEMOGLOBIN-HEMACUE: Hemoglobin: 10.5 g/dL — ABNORMAL LOW (ref 12.0–15.0)

## 2013-09-20 LAB — FERRITIN: Ferritin: 1614 ng/mL — ABNORMAL HIGH (ref 10–291)

## 2013-09-20 MED ORDER — EPOETIN ALFA 10000 UNIT/ML IJ SOLN
INTRAMUSCULAR | Status: DC
Start: 2013-09-20 — End: 2013-09-21
  Filled 2013-09-20: qty 1

## 2013-09-20 MED ORDER — EPOETIN ALFA 20000 UNIT/ML IJ SOLN
INTRAMUSCULAR | Status: AC
  Filled 2013-09-20: qty 1

## 2013-09-20 MED ORDER — EPOETIN ALFA 40000 UNIT/ML IJ SOLN
22500.0000 [IU] | INTRAMUSCULAR | Status: DC
  Administered 2013-09-20: 22500 [IU] via SUBCUTANEOUS

## 2013-09-21 MED FILL — Epoetin Alfa Inj 20000 Unit/ML: INTRAMUSCULAR | Qty: 1 | Status: AC

## 2013-09-21 MED FILL — Epoetin Alfa Inj 10000 Unit/ML: INTRAMUSCULAR | Qty: 1 | Status: AC

## 2013-10-04 ENCOUNTER — Encounter (HOSPITAL_COMMUNITY)
Admission: RE | Admit: 2013-10-04 | Discharge: 2013-10-04 | Disposition: A | Discharge status: Home / Self Care | Payer: Medicare Other | Source: Ambulatory Visit | Attending: Nephrology | Admitting: Nephrology

## 2013-10-04 DIAGNOSIS — D638 Anemia in other chronic diseases classified elsewhere: Secondary | ICD-10-CM | POA: Diagnosis not present

## 2013-10-04 DIAGNOSIS — N183 Chronic kidney disease, stage 3 unspecified: Secondary | ICD-10-CM | POA: Diagnosis not present

## 2013-10-04 LAB — POCT HEMOGLOBIN-HEMACUE: HEMOGLOBIN: 10.1 g/dL — AB (ref 12.0–15.0)

## 2013-10-04 MED ORDER — EPOETIN ALFA 20000 UNIT/ML IJ SOLN
INTRAMUSCULAR | Status: AC
  Administered 2013-10-04: 20000 [IU] via SUBCUTANEOUS
  Filled 2013-10-04: qty 1

## 2013-10-04 MED ORDER — EPOETIN ALFA 40000 UNIT/ML IJ SOLN: 22500.0000 [IU] | INTRAMUSCULAR | Status: DC

## 2013-10-04 MED ORDER — EPOETIN ALFA 10000 UNIT/ML IJ SOLN
INTRAMUSCULAR | Status: AC
  Administered 2013-10-04: 2500 [IU] via SUBCUTANEOUS
  Filled 2013-10-04: qty 1

## 2013-10-08 DIAGNOSIS — E119 Type 2 diabetes mellitus without complications: Secondary | ICD-10-CM | POA: Diagnosis not present

## 2013-10-08 DIAGNOSIS — Z940 Kidney transplant status: Secondary | ICD-10-CM | POA: Diagnosis not present

## 2013-10-08 DIAGNOSIS — D649 Anemia, unspecified: Secondary | ICD-10-CM | POA: Diagnosis not present

## 2013-10-08 DIAGNOSIS — Z79899 Other long term (current) drug therapy: Secondary | ICD-10-CM | POA: Diagnosis not present

## 2013-10-09 DIAGNOSIS — E119 Type 2 diabetes mellitus without complications: Secondary | ICD-10-CM | POA: Diagnosis not present

## 2013-10-18 ENCOUNTER — Encounter (HOSPITAL_COMMUNITY)
Admission: RE | Admit: 2013-10-18 | Discharge: 2013-10-18 | Disposition: A | Discharge status: Home / Self Care | Payer: Medicare Other | Source: Ambulatory Visit | Attending: Nephrology | Admitting: Nephrology

## 2013-10-18 DIAGNOSIS — N183 Chronic kidney disease, stage 3 unspecified: Secondary | ICD-10-CM | POA: Insufficient documentation

## 2013-10-18 DIAGNOSIS — D638 Anemia in other chronic diseases classified elsewhere: Secondary | ICD-10-CM | POA: Diagnosis not present

## 2013-10-18 LAB — POCT HEMOGLOBIN-HEMACUE: Hemoglobin: 10.4 g/dL — ABNORMAL LOW (ref 12.0–15.0)

## 2013-10-18 LAB — IRON AND TIBC
Iron: 37 ug/dL — ABNORMAL LOW (ref 42–135)
Saturation Ratios: 15 % — ABNORMAL LOW (ref 20–55)
TIBC: 242 ug/dL — AB (ref 250–470)
UIBC: 205 ug/dL (ref 125–400)

## 2013-10-18 LAB — FERRITIN: Ferritin: 1255 ng/mL — ABNORMAL HIGH (ref 10–291)

## 2013-10-18 MED ORDER — EPOETIN ALFA 40000 UNIT/ML IJ SOLN: 22500.0000 [IU] | INTRAMUSCULAR | Status: DC

## 2013-10-18 MED ORDER — EPOETIN ALFA 20000 UNIT/ML IJ SOLN
INTRAMUSCULAR | Status: AC
  Administered 2013-10-18: 20000 [IU] via SUBCUTANEOUS
  Filled 2013-10-18: qty 1

## 2013-10-18 MED ORDER — EPOETIN ALFA 10000 UNIT/ML IJ SOLN
INTRAMUSCULAR | Status: AC
  Administered 2013-10-18: 2500 [IU] via SUBCUTANEOUS
  Filled 2013-10-18: qty 1

## 2013-10-31 DIAGNOSIS — D649 Anemia, unspecified: Secondary | ICD-10-CM | POA: Diagnosis not present

## 2013-10-31 DIAGNOSIS — E139 Other specified diabetes mellitus without complications: Secondary | ICD-10-CM | POA: Diagnosis not present

## 2013-10-31 DIAGNOSIS — I10 Essential (primary) hypertension: Secondary | ICD-10-CM | POA: Diagnosis not present

## 2013-10-31 DIAGNOSIS — Z940 Kidney transplant status: Secondary | ICD-10-CM | POA: Diagnosis not present

## 2013-11-01 ENCOUNTER — Encounter (HOSPITAL_COMMUNITY)
Admission: RE | Admit: 2013-11-01 | Discharge: 2013-11-01 | Disposition: A | Discharge status: Home / Self Care | Payer: Medicare Other | Source: Ambulatory Visit | Attending: Nephrology | Admitting: Nephrology

## 2013-11-01 DIAGNOSIS — D638 Anemia in other chronic diseases classified elsewhere: Secondary | ICD-10-CM | POA: Diagnosis not present

## 2013-11-01 LAB — POCT HEMOGLOBIN-HEMACUE: Hemoglobin: 11.1 g/dL — ABNORMAL LOW (ref 12.0–15.0)

## 2013-11-01 MED ORDER — EPOETIN ALFA 20000 UNIT/ML IJ SOLN
INTRAMUSCULAR | Status: AC
  Administered 2013-11-01: 20000 [IU] via SUBCUTANEOUS
  Filled 2013-11-01: qty 1

## 2013-11-01 MED ORDER — EPOETIN ALFA 40000 UNIT/ML IJ SOLN: 22500.0000 [IU] | INTRAMUSCULAR | Status: DC

## 2013-11-01 MED ORDER — EPOETIN ALFA 10000 UNIT/ML IJ SOLN
INTRAMUSCULAR | Status: AC
  Administered 2013-11-01: 2500 [IU] via SUBCUTANEOUS
  Filled 2013-11-01: qty 1

## 2013-11-15 ENCOUNTER — Encounter (HOSPITAL_COMMUNITY)
Admission: RE | Admit: 2013-11-15 | Discharge: 2013-11-15 | Disposition: A | Discharge status: Home / Self Care | Payer: Medicare Other | Source: Ambulatory Visit | Attending: Nephrology | Admitting: Nephrology

## 2013-11-15 DIAGNOSIS — N183 Chronic kidney disease, stage 3 unspecified: Secondary | ICD-10-CM | POA: Diagnosis not present

## 2013-11-15 DIAGNOSIS — D638 Anemia in other chronic diseases classified elsewhere: Secondary | ICD-10-CM | POA: Diagnosis not present

## 2013-11-15 LAB — IRON AND TIBC
Iron: 47 ug/dL (ref 42–135)
Saturation Ratios: 19 % — ABNORMAL LOW (ref 20–55)
TIBC: 242 ug/dL — ABNORMAL LOW (ref 250–470)
UIBC: 195 ug/dL (ref 125–400)

## 2013-11-15 LAB — POCT HEMOGLOBIN-HEMACUE: HEMOGLOBIN: 10.9 g/dL — AB (ref 12.0–15.0)

## 2013-11-15 LAB — FERRITIN: FERRITIN: 977 ng/mL — AB (ref 10–291)

## 2013-11-15 MED ORDER — EPOETIN ALFA 40000 UNIT/ML IJ SOLN: 22500.0000 [IU] | INTRAMUSCULAR | Status: DC

## 2013-11-15 MED ORDER — EPOETIN ALFA 20000 UNIT/ML IJ SOLN
INTRAMUSCULAR | Status: AC
  Administered 2013-11-15: 20000 [IU]
  Filled 2013-11-15: qty 1

## 2013-11-15 MED ORDER — EPOETIN ALFA 10000 UNIT/ML IJ SOLN
INTRAMUSCULAR | Status: AC
  Administered 2013-11-15: 2500 [IU]
  Filled 2013-11-15: qty 1

## 2013-11-22 DIAGNOSIS — Z940 Kidney transplant status: Secondary | ICD-10-CM | POA: Diagnosis not present

## 2013-11-22 DIAGNOSIS — Z79899 Other long term (current) drug therapy: Secondary | ICD-10-CM | POA: Diagnosis not present

## 2013-11-22 DIAGNOSIS — D649 Anemia, unspecified: Secondary | ICD-10-CM | POA: Diagnosis not present

## 2013-11-29 ENCOUNTER — Encounter (HOSPITAL_COMMUNITY)
Admission: RE | Admit: 2013-11-29 | Discharge: 2013-11-29 | Disposition: A | Discharge status: Home / Self Care | Payer: Medicare Other | Source: Ambulatory Visit | Attending: Nephrology | Admitting: Nephrology

## 2013-11-29 DIAGNOSIS — Z0000 Encounter for general adult medical examination without abnormal findings: Secondary | ICD-10-CM | POA: Diagnosis not present

## 2013-11-29 DIAGNOSIS — D638 Anemia in other chronic diseases classified elsewhere: Secondary | ICD-10-CM | POA: Diagnosis not present

## 2013-11-29 LAB — POCT HEMOGLOBIN-HEMACUE: HEMOGLOBIN: 11.6 g/dL — AB (ref 12.0–15.0)

## 2013-11-29 MED ORDER — EPOETIN ALFA 20000 UNIT/ML IJ SOLN
INTRAMUSCULAR | Status: AC
  Administered 2013-11-29: 20000 [IU] via SUBCUTANEOUS
  Filled 2013-11-29: qty 1

## 2013-11-29 MED ORDER — EPOETIN ALFA 10000 UNIT/ML IJ SOLN
INTRAMUSCULAR | Status: AC
  Administered 2013-11-29: 2500 [IU] via SUBCUTANEOUS
  Filled 2013-11-29: qty 1

## 2013-11-29 MED ORDER — EPOETIN ALFA 40000 UNIT/ML IJ SOLN: 22500.0000 [IU] | INTRAMUSCULAR | Status: DC

## 2013-12-13 ENCOUNTER — Encounter (HOSPITAL_COMMUNITY)
Admission: RE | Admit: 2013-12-13 | Discharge: 2013-12-13 | Disposition: A | Discharge status: Home / Self Care | Payer: Medicare Other | Source: Ambulatory Visit | Attending: Nephrology | Admitting: Nephrology

## 2013-12-13 DIAGNOSIS — D631 Anemia in chronic kidney disease: Secondary | ICD-10-CM | POA: Diagnosis not present

## 2013-12-13 DIAGNOSIS — N183 Chronic kidney disease, stage 3 (moderate): Secondary | ICD-10-CM | POA: Insufficient documentation

## 2013-12-13 LAB — POCT HEMOGLOBIN-HEMACUE: Hemoglobin: 11.1 g/dL — ABNORMAL LOW (ref 12.0–15.0)

## 2013-12-13 LAB — FERRITIN: Ferritin: 879 ng/mL — ABNORMAL HIGH (ref 10–291)

## 2013-12-13 LAB — IRON AND TIBC
IRON: 43 ug/dL (ref 42–135)
SATURATION RATIOS: 18 % — AB (ref 20–55)
TIBC: 233 ug/dL — AB (ref 250–470)
UIBC: 190 ug/dL (ref 125–400)

## 2013-12-13 LAB — GLUCOSE, CAPILLARY: Glucose-Capillary: 324 mg/dL — ABNORMAL HIGH (ref 70–99)

## 2013-12-13 MED ORDER — EPOETIN ALFA 10000 UNIT/ML IJ SOLN
INTRAMUSCULAR | Status: AC
  Administered 2013-12-13: 2500 [IU] via SUBCUTANEOUS
  Filled 2013-12-13: qty 1

## 2013-12-13 MED ORDER — EPOETIN ALFA 20000 UNIT/ML IJ SOLN
INTRAMUSCULAR | Status: AC
  Administered 2013-12-13: 20000 [IU] via SUBCUTANEOUS
  Filled 2013-12-13: qty 1

## 2013-12-13 MED ORDER — EPOETIN ALFA 40000 UNIT/ML IJ SOLN: 22500.0000 [IU] | INTRAMUSCULAR | Status: DC

## 2013-12-13 NOTE — Progress Notes (Signed)
Patient presented with N,V,D since yesterday. States that she took Zofran at home. I called [facility] and spoke with [place]. Ok to give injection as ordered today per Dr. Lorrene Reid. Also was told to check patient's blood glucose and instruct patient to call her diabetes doctor if her blood sugar is abnormal. Patient verbalizes understanding and states that she will call her diabetes doctor.

## 2013-12-15 DIAGNOSIS — O94 Sequelae of complication of pregnancy, childbirth, and the puerperium: Secondary | ICD-10-CM | POA: Diagnosis not present

## 2013-12-15 DIAGNOSIS — D631 Anemia in chronic kidney disease: Secondary | ICD-10-CM | POA: Diagnosis not present

## 2013-12-15 DIAGNOSIS — E119 Type 2 diabetes mellitus without complications: Secondary | ICD-10-CM | POA: Diagnosis not present

## 2013-12-15 DIAGNOSIS — E0822 Diabetes mellitus due to underlying condition with diabetic chronic kidney disease: Secondary | ICD-10-CM | POA: Diagnosis not present

## 2013-12-15 DIAGNOSIS — K529 Noninfective gastroenteritis and colitis, unspecified: Secondary | ICD-10-CM | POA: Diagnosis not present

## 2013-12-15 DIAGNOSIS — K5289 Other specified noninfective gastroenteritis and colitis: Secondary | ICD-10-CM | POA: Diagnosis not present

## 2013-12-17 DIAGNOSIS — Z23 Encounter for immunization: Secondary | ICD-10-CM | POA: Diagnosis not present

## 2013-12-17 DIAGNOSIS — A084 Viral intestinal infection, unspecified: Secondary | ICD-10-CM | POA: Diagnosis not present

## 2013-12-17 DIAGNOSIS — E0822 Diabetes mellitus due to underlying condition with diabetic chronic kidney disease: Secondary | ICD-10-CM | POA: Diagnosis not present

## 2013-12-17 DIAGNOSIS — I120 Hypertensive chronic kidney disease with stage 5 chronic kidney disease or end stage renal disease: Secondary | ICD-10-CM | POA: Diagnosis not present

## 2013-12-27 ENCOUNTER — Encounter (HOSPITAL_COMMUNITY)
Admission: RE | Admit: 2013-12-27 | Discharge: 2013-12-27 | Disposition: A | Discharge status: Home / Self Care | Payer: Medicare Other | Source: Ambulatory Visit | Attending: Nephrology | Admitting: Nephrology

## 2013-12-27 DIAGNOSIS — N183 Chronic kidney disease, stage 3 (moderate): Secondary | ICD-10-CM | POA: Diagnosis not present

## 2013-12-27 LAB — POCT HEMOGLOBIN-HEMACUE: Hemoglobin: 12.2 g/dL (ref 12.0–15.0)

## 2013-12-27 MED ORDER — EPOETIN ALFA 40000 UNIT/ML IJ SOLN: 22500.0000 [IU] | INTRAMUSCULAR | Status: DC

## 2014-01-10 ENCOUNTER — Encounter (HOSPITAL_COMMUNITY)
Admission: RE | Admit: 2014-01-10 | Discharge: 2014-01-10 | Disposition: A | Discharge status: Home / Self Care | Payer: Medicare Other | Source: Ambulatory Visit | Attending: Nephrology | Admitting: Nephrology

## 2014-01-10 DIAGNOSIS — N183 Chronic kidney disease, stage 3 (moderate): Secondary | ICD-10-CM | POA: Diagnosis not present

## 2014-01-10 DIAGNOSIS — E1169 Type 2 diabetes mellitus with other specified complication: Secondary | ICD-10-CM | POA: Diagnosis not present

## 2014-01-10 DIAGNOSIS — T8610 Unspecified complication of kidney transplant: Secondary | ICD-10-CM | POA: Diagnosis not present

## 2014-01-10 DIAGNOSIS — I10 Essential (primary) hypertension: Secondary | ICD-10-CM | POA: Diagnosis not present

## 2014-01-10 LAB — POCT HEMOGLOBIN-HEMACUE: Hemoglobin: 11.2 g/dL — ABNORMAL LOW (ref 12.0–15.0)

## 2014-01-10 MED ORDER — EPOETIN ALFA 10000 UNIT/ML IJ SOLN
INTRAMUSCULAR | Status: AC
  Administered 2014-01-10: 2250 [IU]
  Filled 2014-01-10: qty 1

## 2014-01-10 MED ORDER — EPOETIN ALFA 20000 UNIT/ML IJ SOLN
INTRAMUSCULAR | Status: AC
  Administered 2014-01-10: 20000 [IU]
  Filled 2014-01-10: qty 1

## 2014-01-10 MED ORDER — EPOETIN ALFA 40000 UNIT/ML IJ SOLN: 22500.0000 [IU] | INTRAMUSCULAR | Status: DC

## 2014-01-17 DIAGNOSIS — Z940 Kidney transplant status: Secondary | ICD-10-CM | POA: Diagnosis not present

## 2014-01-17 DIAGNOSIS — Z79899 Other long term (current) drug therapy: Secondary | ICD-10-CM | POA: Diagnosis not present

## 2014-01-17 DIAGNOSIS — E785 Hyperlipidemia, unspecified: Secondary | ICD-10-CM | POA: Diagnosis not present

## 2014-01-17 DIAGNOSIS — D649 Anemia, unspecified: Secondary | ICD-10-CM | POA: Diagnosis not present

## 2014-01-17 DIAGNOSIS — N2581 Secondary hyperparathyroidism of renal origin: Secondary | ICD-10-CM | POA: Diagnosis not present

## 2014-01-23 ENCOUNTER — Other Ambulatory Visit (HOSPITAL_COMMUNITY): Payer: Self-pay | Admitting: *Deleted

## 2014-01-24 ENCOUNTER — Encounter (HOSPITAL_COMMUNITY): Payer: Medicare Other

## 2014-01-28 ENCOUNTER — Encounter (HOSPITAL_COMMUNITY)
Admission: RE | Admit: 2014-01-28 | Discharge: 2014-01-28 | Disposition: A | Discharge status: Home / Self Care | Payer: Medicare Other | Source: Ambulatory Visit | Attending: Nephrology | Admitting: Nephrology

## 2014-01-28 DIAGNOSIS — N183 Chronic kidney disease, stage 3 (moderate): Secondary | ICD-10-CM | POA: Diagnosis not present

## 2014-01-28 DIAGNOSIS — D631 Anemia in chronic kidney disease: Secondary | ICD-10-CM | POA: Diagnosis not present

## 2014-01-28 LAB — IRON AND TIBC
Iron: 67 ug/dL (ref 42–135)
Saturation Ratios: 27 % (ref 20–55)
TIBC: 250 ug/dL (ref 250–470)
UIBC: 183 ug/dL (ref 125–400)

## 2014-01-28 LAB — FERRITIN: FERRITIN: 924 ng/mL — AB (ref 10–291)

## 2014-01-28 LAB — POCT HEMOGLOBIN-HEMACUE: HEMOGLOBIN: 10.9 g/dL — AB (ref 12.0–15.0)

## 2014-01-28 MED ORDER — EPOETIN ALFA 40000 UNIT/ML IJ SOLN: 22500.0000 [IU] | INTRAMUSCULAR | Status: DC

## 2014-01-28 MED ORDER — EPOETIN ALFA 20000 UNIT/ML IJ SOLN
INTRAMUSCULAR | Status: AC
  Administered 2014-01-28: 20000 [IU] via SUBCUTANEOUS
  Filled 2014-01-28: qty 1

## 2014-01-28 MED ORDER — EPOETIN ALFA 10000 UNIT/ML IJ SOLN
INTRAMUSCULAR | Status: AC
  Administered 2014-01-28: 2500 [IU] via SUBCUTANEOUS
  Filled 2014-01-28: qty 1

## 2014-02-14 ENCOUNTER — Encounter (HOSPITAL_COMMUNITY)
Admission: RE | Admit: 2014-02-14 | Discharge: 2014-02-14 | Disposition: A | Discharge status: Home / Self Care | Payer: Medicare Other | Source: Ambulatory Visit | Attending: Nephrology | Admitting: Nephrology

## 2014-02-14 DIAGNOSIS — D631 Anemia in chronic kidney disease: Secondary | ICD-10-CM | POA: Diagnosis not present

## 2014-02-14 DIAGNOSIS — N183 Chronic kidney disease, stage 3 (moderate): Secondary | ICD-10-CM | POA: Diagnosis not present

## 2014-02-14 LAB — POCT HEMOGLOBIN-HEMACUE: Hemoglobin: 11.3 g/dL — ABNORMAL LOW (ref 12.0–15.0)

## 2014-02-14 MED ORDER — EPOETIN ALFA 40000 UNIT/ML IJ SOLN: 22500.0000 [IU] | INTRAMUSCULAR | Status: DC

## 2014-02-14 MED ORDER — EPOETIN ALFA 20000 UNIT/ML IJ SOLN
INTRAMUSCULAR | Status: AC
  Administered 2014-02-14: 20000 [IU] via SUBCUTANEOUS
  Filled 2014-02-14: qty 1

## 2014-02-14 MED ORDER — EPOETIN ALFA 10000 UNIT/ML IJ SOLN
INTRAMUSCULAR | Status: AC
  Administered 2014-02-14: 10000 [IU] via SUBCUTANEOUS
  Filled 2014-02-14: qty 1

## 2014-02-21 DIAGNOSIS — E119 Type 2 diabetes mellitus without complications: Secondary | ICD-10-CM | POA: Diagnosis not present

## 2014-02-21 DIAGNOSIS — E0822 Diabetes mellitus due to underlying condition with diabetic chronic kidney disease: Secondary | ICD-10-CM | POA: Diagnosis not present

## 2014-02-28 ENCOUNTER — Encounter (HOSPITAL_COMMUNITY)
Admission: RE | Admit: 2014-02-28 | Discharge: 2014-02-28 | Disposition: A | Discharge status: Home / Self Care | Payer: Medicare Other | Source: Ambulatory Visit | Attending: Nephrology | Admitting: Nephrology

## 2014-02-28 DIAGNOSIS — N183 Chronic kidney disease, stage 3 (moderate): Secondary | ICD-10-CM | POA: Diagnosis not present

## 2014-02-28 LAB — POCT HEMOGLOBIN-HEMACUE: HEMOGLOBIN: 11.3 g/dL — AB (ref 12.0–15.0)

## 2014-02-28 MED ORDER — EPOETIN ALFA 20000 UNIT/ML IJ SOLN
INTRAMUSCULAR | Status: AC
  Administered 2014-02-28: 20000 [IU]
  Filled 2014-02-28: qty 1

## 2014-02-28 MED ORDER — EPOETIN ALFA 10000 UNIT/ML IJ SOLN
INTRAMUSCULAR | Status: AC
  Administered 2014-02-28: 2500 [IU]
  Filled 2014-02-28: qty 1

## 2014-02-28 MED ORDER — EPOETIN ALFA 40000 UNIT/ML IJ SOLN: 22500.0000 [IU] | INTRAMUSCULAR | Status: DC

## 2014-03-01 LAB — IRON AND TIBC
Iron: 98 ug/dL (ref 42–135)
Saturation Ratios: 40 % (ref 20–55)
TIBC: 244 ug/dL — ABNORMAL LOW (ref 250–470)
UIBC: 146 ug/dL (ref 125–400)

## 2014-03-01 LAB — FERRITIN: FERRITIN: 1256 ng/mL — AB (ref 10–291)

## 2014-03-14 ENCOUNTER — Encounter (HOSPITAL_COMMUNITY)
Admission: RE | Admit: 2014-03-14 | Discharge: 2014-03-14 | Disposition: A | Discharge status: Home / Self Care | Payer: Medicare Other | Source: Ambulatory Visit | Attending: Nephrology | Admitting: Nephrology

## 2014-03-14 DIAGNOSIS — N183 Chronic kidney disease, stage 3 (moderate): Secondary | ICD-10-CM | POA: Diagnosis not present

## 2014-03-14 LAB — POCT HEMOGLOBIN-HEMACUE: Hemoglobin: 11 g/dL — ABNORMAL LOW (ref 12.0–15.0)

## 2014-03-14 MED ORDER — EPOETIN ALFA 20000 UNIT/ML IJ SOLN
INTRAMUSCULAR | Status: AC
  Administered 2014-03-14: 20000 [IU] via SUBCUTANEOUS
  Filled 2014-03-14: qty 1

## 2014-03-14 MED ORDER — EPOETIN ALFA 10000 UNIT/ML IJ SOLN
INTRAMUSCULAR | Status: AC
  Administered 2014-03-14: 10000 [IU] via SUBCUTANEOUS
  Filled 2014-03-14: qty 1

## 2014-03-14 MED ORDER — EPOETIN ALFA 40000 UNIT/ML IJ SOLN: 22500.0000 [IU] | INTRAMUSCULAR | Status: DC

## 2014-03-28 ENCOUNTER — Encounter (HOSPITAL_COMMUNITY): Payer: Medicare Other

## 2014-03-28 ENCOUNTER — Encounter (HOSPITAL_COMMUNITY)
Admission: RE | Admit: 2014-03-28 | Discharge: 2014-03-28 | Disposition: A | Discharge status: Home / Self Care | Payer: Medicare Other | Source: Ambulatory Visit | Attending: Nephrology | Admitting: Nephrology

## 2014-03-28 DIAGNOSIS — D649 Anemia, unspecified: Secondary | ICD-10-CM | POA: Diagnosis not present

## 2014-03-28 DIAGNOSIS — N183 Chronic kidney disease, stage 3 (moderate): Secondary | ICD-10-CM | POA: Insufficient documentation

## 2014-03-28 DIAGNOSIS — E139 Other specified diabetes mellitus without complications: Secondary | ICD-10-CM | POA: Diagnosis not present

## 2014-03-28 DIAGNOSIS — T8610 Unspecified complication of kidney transplant: Secondary | ICD-10-CM | POA: Diagnosis not present

## 2014-03-28 DIAGNOSIS — Z940 Kidney transplant status: Secondary | ICD-10-CM | POA: Diagnosis not present

## 2014-03-28 DIAGNOSIS — I10 Essential (primary) hypertension: Secondary | ICD-10-CM | POA: Diagnosis not present

## 2014-03-28 DIAGNOSIS — D631 Anemia in chronic kidney disease: Secondary | ICD-10-CM | POA: Diagnosis not present

## 2014-03-28 DIAGNOSIS — E0822 Diabetes mellitus due to underlying condition with diabetic chronic kidney disease: Secondary | ICD-10-CM | POA: Diagnosis not present

## 2014-03-28 DIAGNOSIS — E119 Type 2 diabetes mellitus without complications: Secondary | ICD-10-CM | POA: Diagnosis not present

## 2014-03-28 LAB — IRON AND TIBC
Iron: 59 ug/dL (ref 42–145)
Saturation Ratios: 23 % (ref 20–55)
TIBC: 255 ug/dL (ref 250–470)
UIBC: 196 ug/dL (ref 125–400)

## 2014-03-28 LAB — POCT HEMOGLOBIN-HEMACUE: Hemoglobin: 11.8 g/dL — ABNORMAL LOW (ref 12.0–15.0)

## 2014-03-28 LAB — FERRITIN: Ferritin: 936 ng/mL — ABNORMAL HIGH (ref 10–291)

## 2014-03-28 MED ORDER — EPOETIN ALFA 10000 UNIT/ML IJ SOLN
INTRAMUSCULAR | Status: AC
  Administered 2014-03-28: 10000 [IU] via SUBCUTANEOUS
  Filled 2014-03-28: qty 1

## 2014-03-28 MED ORDER — EPOETIN ALFA 40000 UNIT/ML IJ SOLN: 22500.0000 [IU] | INTRAMUSCULAR | Status: DC

## 2014-03-28 MED ORDER — EPOETIN ALFA 20000 UNIT/ML IJ SOLN
INTRAMUSCULAR | Status: AC
  Administered 2014-03-28: 20000 [IU] via SUBCUTANEOUS
  Filled 2014-03-28: qty 1

## 2014-04-04 DIAGNOSIS — Z79899 Other long term (current) drug therapy: Secondary | ICD-10-CM | POA: Diagnosis not present

## 2014-04-04 DIAGNOSIS — D649 Anemia, unspecified: Secondary | ICD-10-CM | POA: Diagnosis not present

## 2014-04-11 ENCOUNTER — Encounter (HOSPITAL_COMMUNITY)
Admission: RE | Admit: 2014-04-11 | Discharge: 2014-04-11 | Disposition: A | Discharge status: Home / Self Care | Payer: Medicare Other | Source: Ambulatory Visit | Attending: Nephrology | Admitting: Nephrology

## 2014-04-11 DIAGNOSIS — N183 Chronic kidney disease, stage 3 (moderate): Secondary | ICD-10-CM | POA: Diagnosis not present

## 2014-04-11 DIAGNOSIS — D631 Anemia in chronic kidney disease: Secondary | ICD-10-CM | POA: Diagnosis not present

## 2014-04-11 MED ORDER — EPOETIN ALFA 10000 UNIT/ML IJ SOLN
INTRAMUSCULAR | Status: AC
  Administered 2014-04-11: 10000 [IU]
  Filled 2014-04-11: qty 1

## 2014-04-11 MED ORDER — EPOETIN ALFA 40000 UNIT/ML IJ SOLN: 22500.0000 [IU] | INTRAMUSCULAR | Status: DC

## 2014-04-11 MED ORDER — EPOETIN ALFA 20000 UNIT/ML IJ SOLN
INTRAMUSCULAR | Status: AC
  Administered 2014-04-11: 20000 [IU]
  Filled 2014-04-11: qty 1

## 2014-04-12 LAB — POCT HEMOGLOBIN-HEMACUE: HEMOGLOBIN: 11.9 g/dL — AB (ref 12.0–15.0)

## 2014-04-15 DIAGNOSIS — Z940 Kidney transplant status: Secondary | ICD-10-CM | POA: Diagnosis not present

## 2014-04-18 DIAGNOSIS — N390 Urinary tract infection, site not specified: Secondary | ICD-10-CM | POA: Diagnosis not present

## 2014-04-24 ENCOUNTER — Other Ambulatory Visit (HOSPITAL_COMMUNITY): Payer: Self-pay

## 2014-04-24 DIAGNOSIS — Z940 Kidney transplant status: Secondary | ICD-10-CM | POA: Diagnosis not present

## 2014-04-25 ENCOUNTER — Encounter (HOSPITAL_COMMUNITY)
Admission: RE | Admit: 2014-04-25 | Discharge: 2014-04-25 | Disposition: A | Discharge status: Home / Self Care | Payer: Medicare Other | Source: Ambulatory Visit | Attending: Nephrology | Admitting: Nephrology

## 2014-04-25 DIAGNOSIS — D631 Anemia in chronic kidney disease: Secondary | ICD-10-CM | POA: Insufficient documentation

## 2014-04-25 DIAGNOSIS — N183 Chronic kidney disease, stage 3 (moderate): Secondary | ICD-10-CM | POA: Insufficient documentation

## 2014-04-25 LAB — POCT HEMOGLOBIN-HEMACUE: Hemoglobin: 11.3 g/dL — ABNORMAL LOW (ref 12.0–15.0)

## 2014-04-25 MED ORDER — EPOETIN ALFA 40000 UNIT/ML IJ SOLN: 22500.0000 [IU] | INTRAMUSCULAR | Status: DC

## 2014-04-26 LAB — IRON AND TIBC
Iron: 35 ug/dL — ABNORMAL LOW (ref 42–145)
SATURATION RATIOS: 15 % — AB (ref 20–55)
TIBC: 235 ug/dL — ABNORMAL LOW (ref 250–470)
UIBC: 200 ug/dL (ref 125–400)

## 2014-04-26 LAB — FERRITIN: Ferritin: 969 ng/mL — ABNORMAL HIGH (ref 10–291)

## 2014-04-26 MED ORDER — EPOETIN ALFA 10000 UNIT/ML IJ SOLN
INTRAMUSCULAR | Status: AC
  Administered 2014-04-25: 2250 [IU] via SUBCUTANEOUS
  Filled 2014-04-26: qty 1

## 2014-04-26 MED ORDER — EPOETIN ALFA 20000 UNIT/ML IJ SOLN
INTRAMUSCULAR | Status: AC
  Administered 2014-04-25: 20000 [IU] via SUBCUTANEOUS
  Filled 2014-04-26: qty 1

## 2014-05-09 ENCOUNTER — Inpatient Hospital Stay (HOSPITAL_COMMUNITY): Admission: RE | Admit: 2014-05-09 | Payer: Medicare Other | Source: Ambulatory Visit

## 2014-05-09 ENCOUNTER — Other Ambulatory Visit: Payer: Medicare Other | Admitting: Family Medicine

## 2014-05-09 ENCOUNTER — Other Ambulatory Visit (HOSPITAL_COMMUNITY)
Admission: RE | Admit: 2014-05-09 | Discharge: 2014-05-09 | Disposition: A | Discharge status: Home / Self Care | Payer: Medicare Other | Source: Ambulatory Visit | Attending: Family Medicine | Admitting: Family Medicine

## 2014-05-09 DIAGNOSIS — N760 Acute vaginitis: Secondary | ICD-10-CM | POA: Insufficient documentation

## 2014-05-09 DIAGNOSIS — T8610 Unspecified complication of kidney transplant: Secondary | ICD-10-CM | POA: Diagnosis not present

## 2014-05-09 DIAGNOSIS — E0822 Diabetes mellitus due to underlying condition with diabetic chronic kidney disease: Secondary | ICD-10-CM | POA: Diagnosis not present

## 2014-05-09 DIAGNOSIS — Z113 Encounter for screening for infections with a predominantly sexual mode of transmission: Secondary | ICD-10-CM | POA: Diagnosis not present

## 2014-05-11 LAB — URINE CYTOLOGY ANCILLARY ONLY
Bacterial vaginitis: NEGATIVE
CANDIDA VAGINITIS: NEGATIVE
Chlamydia: NEGATIVE
Neisseria Gonorrhea: NEGATIVE
Trichomonas: NEGATIVE

## 2014-05-16 ENCOUNTER — Encounter (HOSPITAL_COMMUNITY): Payer: Medicare Other

## 2014-05-17 ENCOUNTER — Encounter (HOSPITAL_COMMUNITY)
Admission: RE | Admit: 2014-05-17 | Discharge: 2014-05-17 | Disposition: A | Discharge status: Home / Self Care | Payer: Medicare Other | Source: Ambulatory Visit | Attending: Nephrology | Admitting: Nephrology

## 2014-05-17 DIAGNOSIS — N183 Chronic kidney disease, stage 3 (moderate): Secondary | ICD-10-CM | POA: Diagnosis not present

## 2014-05-17 DIAGNOSIS — D631 Anemia in chronic kidney disease: Secondary | ICD-10-CM | POA: Diagnosis not present

## 2014-05-17 LAB — POCT HEMOGLOBIN-HEMACUE: Hemoglobin: 11.3 g/dL — ABNORMAL LOW (ref 12.0–15.0)

## 2014-05-17 MED ORDER — EPOETIN ALFA 40000 UNIT/ML IJ SOLN: 22500.0000 [IU] | INTRAMUSCULAR | Status: DC

## 2014-05-18 MED ORDER — EPOETIN ALFA 3000 UNIT/ML IJ SOLN
INTRAMUSCULAR | Status: AC
  Administered 2014-05-17: 2250 [IU] via SUBCUTANEOUS
  Filled 2014-05-18: qty 1

## 2014-05-18 MED ORDER — EPOETIN ALFA 20000 UNIT/ML IJ SOLN
INTRAMUSCULAR | Status: AC
  Administered 2014-05-17: 20000 [IU] via SUBCUTANEOUS
  Filled 2014-05-18: qty 1

## 2014-05-30 ENCOUNTER — Encounter (HOSPITAL_COMMUNITY)
Admission: RE | Admit: 2014-05-30 | Discharge: 2014-05-30 | Disposition: A | Discharge status: Home / Self Care | Payer: Medicare Other | Source: Ambulatory Visit | Attending: Nephrology | Admitting: Nephrology

## 2014-05-30 DIAGNOSIS — N183 Chronic kidney disease, stage 3 (moderate): Secondary | ICD-10-CM | POA: Diagnosis not present

## 2014-05-30 DIAGNOSIS — D631 Anemia in chronic kidney disease: Secondary | ICD-10-CM | POA: Diagnosis not present

## 2014-05-30 LAB — IRON AND TIBC
IRON: 25 ug/dL — AB (ref 42–145)
Saturation Ratios: 11 % — ABNORMAL LOW (ref 20–55)
TIBC: 224 ug/dL — ABNORMAL LOW (ref 250–470)
UIBC: 199 ug/dL (ref 125–400)

## 2014-05-30 LAB — FERRITIN: Ferritin: 1622 ng/mL — ABNORMAL HIGH (ref 10–291)

## 2014-05-30 LAB — POCT HEMOGLOBIN-HEMACUE: Hemoglobin: 11.2 g/dL — ABNORMAL LOW (ref 12.0–15.0)

## 2014-05-30 MED ORDER — EPOETIN ALFA 3000 UNIT/ML IJ SOLN
INTRAMUSCULAR | Status: AC
  Administered 2014-05-30: 2500 [IU] via SUBCUTANEOUS
  Filled 2014-05-30: qty 1

## 2014-05-30 MED ORDER — EPOETIN ALFA 20000 UNIT/ML IJ SOLN
INTRAMUSCULAR | Status: AC
  Administered 2014-05-30: 20000 [IU] via SUBCUTANEOUS
  Filled 2014-05-30: qty 1

## 2014-05-30 MED ORDER — EPOETIN ALFA 40000 UNIT/ML IJ SOLN: 22500.0000 [IU] | INTRAMUSCULAR | Status: DC

## 2014-06-04 DIAGNOSIS — Z79899 Other long term (current) drug therapy: Secondary | ICD-10-CM | POA: Diagnosis not present

## 2014-06-04 DIAGNOSIS — N390 Urinary tract infection, site not specified: Secondary | ICD-10-CM | POA: Diagnosis not present

## 2014-06-04 DIAGNOSIS — Z940 Kidney transplant status: Secondary | ICD-10-CM | POA: Diagnosis not present

## 2014-06-04 DIAGNOSIS — D649 Anemia, unspecified: Secondary | ICD-10-CM | POA: Diagnosis not present

## 2014-06-06 DIAGNOSIS — E875 Hyperkalemia: Secondary | ICD-10-CM | POA: Diagnosis not present

## 2014-06-13 ENCOUNTER — Encounter (HOSPITAL_COMMUNITY)
Admission: RE | Admit: 2014-06-13 | Discharge: 2014-06-13 | Disposition: A | Discharge status: Home / Self Care | Payer: Medicare Other | Source: Ambulatory Visit | Attending: Nephrology | Admitting: Nephrology

## 2014-06-13 DIAGNOSIS — D631 Anemia in chronic kidney disease: Secondary | ICD-10-CM | POA: Diagnosis not present

## 2014-06-13 DIAGNOSIS — N183 Chronic kidney disease, stage 3 (moderate): Secondary | ICD-10-CM | POA: Diagnosis not present

## 2014-06-13 LAB — POCT HEMOGLOBIN-HEMACUE: Hemoglobin: 10.2 g/dL — ABNORMAL LOW (ref 12.0–15.0)

## 2014-06-13 MED ORDER — EPOETIN ALFA 40000 UNIT/ML IJ SOLN: 22500.0000 [IU] | INTRAMUSCULAR | Status: DC

## 2014-06-13 MED ORDER — EPOETIN ALFA 3000 UNIT/ML IJ SOLN
INTRAMUSCULAR | Status: AC
  Administered 2014-06-13: 2250 [IU]
  Filled 2014-06-13: qty 1

## 2014-06-13 MED ORDER — EPOETIN ALFA 20000 UNIT/ML IJ SOLN
INTRAMUSCULAR | Status: AC
  Administered 2014-06-13: 20000 [IU]
  Filled 2014-06-13: qty 1

## 2014-06-23 ENCOUNTER — Emergency Department (HOSPITAL_COMMUNITY): Payer: Medicare Other

## 2014-06-23 ENCOUNTER — Inpatient Hospital Stay (HOSPITAL_COMMUNITY)
Admission: EM | Admit: 2014-06-23 | Discharge: 2014-06-25 | DRG: 872 | Disposition: A | Discharge status: Home / Self Care | Payer: Medicare Other | Attending: Internal Medicine | Admitting: Internal Medicine

## 2014-06-23 ENCOUNTER — Encounter (HOSPITAL_COMMUNITY): Payer: Self-pay | Admitting: Emergency Medicine

## 2014-06-23 DIAGNOSIS — Z7952 Long term (current) use of systemic steroids: Secondary | ICD-10-CM

## 2014-06-23 DIAGNOSIS — Z888 Allergy status to other drugs, medicaments and biological substances status: Secondary | ICD-10-CM

## 2014-06-23 DIAGNOSIS — I129 Hypertensive chronic kidney disease with stage 1 through stage 4 chronic kidney disease, or unspecified chronic kidney disease: Secondary | ICD-10-CM | POA: Diagnosis not present

## 2014-06-23 DIAGNOSIS — N12 Tubulo-interstitial nephritis, not specified as acute or chronic: Secondary | ICD-10-CM

## 2014-06-23 DIAGNOSIS — R079 Chest pain, unspecified: Secondary | ICD-10-CM

## 2014-06-23 DIAGNOSIS — E1165 Type 2 diabetes mellitus with hyperglycemia: Secondary | ICD-10-CM | POA: Diagnosis not present

## 2014-06-23 DIAGNOSIS — R05 Cough: Secondary | ICD-10-CM | POA: Diagnosis not present

## 2014-06-23 DIAGNOSIS — E785 Hyperlipidemia, unspecified: Secondary | ICD-10-CM | POA: Diagnosis present

## 2014-06-23 DIAGNOSIS — D631 Anemia in chronic kidney disease: Secondary | ICD-10-CM | POA: Diagnosis present

## 2014-06-23 DIAGNOSIS — A419 Sepsis, unspecified organism: Principal | ICD-10-CM | POA: Diagnosis present

## 2014-06-23 DIAGNOSIS — N390 Urinary tract infection, site not specified: Secondary | ICD-10-CM | POA: Diagnosis not present

## 2014-06-23 DIAGNOSIS — R1031 Right lower quadrant pain: Secondary | ICD-10-CM

## 2014-06-23 DIAGNOSIS — N183 Chronic kidney disease, stage 3 (moderate): Secondary | ICD-10-CM | POA: Diagnosis present

## 2014-06-23 DIAGNOSIS — Z91018 Allergy to other foods: Secondary | ICD-10-CM

## 2014-06-23 DIAGNOSIS — Z940 Kidney transplant status: Secondary | ICD-10-CM | POA: Diagnosis not present

## 2014-06-23 DIAGNOSIS — R059 Cough, unspecified: Secondary | ICD-10-CM

## 2014-06-23 DIAGNOSIS — N189 Chronic kidney disease, unspecified: Secondary | ICD-10-CM | POA: Diagnosis present

## 2014-06-23 DIAGNOSIS — Z91013 Allergy to seafood: Secondary | ICD-10-CM

## 2014-06-23 DIAGNOSIS — Z794 Long term (current) use of insulin: Secondary | ICD-10-CM

## 2014-06-23 HISTORY — DX: Tubulo-interstitial nephritis, not specified as acute or chronic: N12

## 2014-06-23 LAB — COMPREHENSIVE METABOLIC PANEL
ALT: 10 U/L (ref 0–35)
AST: 14 U/L (ref 0–37)
Albumin: 2.9 g/dL — ABNORMAL LOW (ref 3.5–5.2)
Alkaline Phosphatase: 89 U/L (ref 39–117)
Anion gap: 8 (ref 5–15)
BUN: 25 mg/dL — ABNORMAL HIGH (ref 6–23)
CALCIUM: 8.4 mg/dL (ref 8.4–10.5)
CO2: 23 mmol/L (ref 19–32)
Chloride: 99 mmol/L (ref 96–112)
Creatinine, Ser: 1.76 mg/dL — ABNORMAL HIGH (ref 0.50–1.10)
GFR, EST AFRICAN AMERICAN: 44 mL/min — AB (ref >90)
GFR, EST NON AFRICAN AMERICAN: 38 mL/min — AB (ref >90)
Glucose, Bld: 316 mg/dL — ABNORMAL HIGH (ref 70–99)
POTASSIUM: 3.5 mmol/L (ref 3.5–5.1)
Sodium: 130 mmol/L — ABNORMAL LOW (ref 135–145)
TOTAL PROTEIN: 8 g/dL (ref 6.0–8.3)
Total Bilirubin: 0.7 mg/dL (ref 0.3–1.2)

## 2014-06-23 LAB — CBC WITH DIFFERENTIAL/PLATELET
BASOS ABS: 0 10*3/uL (ref 0.0–0.1)
Basophils Relative: 0 % (ref 0–1)
EOS ABS: 0.1 10*3/uL (ref 0.0–0.7)
Eosinophils Relative: 1 % (ref 0–5)
HEMATOCRIT: 30.2 % — AB (ref 36.0–46.0)
Hemoglobin: 9.8 g/dL — ABNORMAL LOW (ref 12.0–15.0)
Lymphocytes Relative: 14 % (ref 12–46)
Lymphs Abs: 2 10*3/uL (ref 0.7–4.0)
MCH: 27.4 pg (ref 26.0–34.0)
MCHC: 32.5 g/dL (ref 30.0–36.0)
MCV: 84.4 fL (ref 78.0–100.0)
Monocytes Absolute: 1.2 10*3/uL — ABNORMAL HIGH (ref 0.1–1.0)
Monocytes Relative: 9 % (ref 3–12)
NEUTROS ABS: 10.6 10*3/uL — AB (ref 1.7–7.7)
NEUTROS PCT: 76 % (ref 43–77)
PLATELETS: 300 10*3/uL (ref 150–400)
RBC: 3.58 MIL/uL — ABNORMAL LOW (ref 3.87–5.11)
RDW: 13.8 % (ref 11.5–15.5)
WBC: 13.9 10*3/uL — ABNORMAL HIGH (ref 4.0–10.5)

## 2014-06-23 LAB — URINALYSIS, ROUTINE W REFLEX MICROSCOPIC
Bilirubin Urine: NEGATIVE
Glucose, UA: 500 mg/dL — AB
HGB URINE DIPSTICK: NEGATIVE
Ketones, ur: NEGATIVE mg/dL
NITRITE: NEGATIVE
PROTEIN: 100 mg/dL — AB
Specific Gravity, Urine: 1.016 (ref 1.005–1.030)
UROBILINOGEN UA: 0.2 mg/dL (ref 0.0–1.0)
pH: 5 (ref 5.0–8.0)

## 2014-06-23 LAB — LIPASE, BLOOD: LIPASE: 21 U/L (ref 11–59)

## 2014-06-23 LAB — POC URINE PREG, ED: Preg Test, Ur: NEGATIVE

## 2014-06-23 LAB — URINE MICROSCOPIC-ADD ON

## 2014-06-23 LAB — I-STAT CG4 LACTIC ACID, ED: Lactic Acid, Venous: 1.35 mmol/L (ref 0.5–2.0)

## 2014-06-23 MED ORDER — ACETAMINOPHEN 325 MG PO TABS
650.0000 mg | ORAL_TABLET | Freq: Four times a day (QID) | ORAL | Status: DC | PRN
  Administered 2014-06-23: 650 mg via ORAL
  Filled 2014-06-23: qty 2

## 2014-06-23 MED ORDER — SODIUM CHLORIDE 0.9 % IV BOLUS (SEPSIS)
1000.0000 mL | INTRAVENOUS | Status: AC
  Administered 2014-06-23: 1000 mL via INTRAVENOUS

## 2014-06-23 MED ORDER — HYDROCORTISONE NA SUCCINATE PF 100 MG IJ SOLR
50.0000 mg | Freq: Once | INTRAMUSCULAR | Status: AC
  Administered 2014-06-23: 50 mg via INTRAVENOUS
  Filled 2014-06-23: qty 2

## 2014-06-23 MED ORDER — IOHEXOL 300 MG/ML  SOLN
25.0000 mL | Freq: Once | INTRAMUSCULAR | Status: AC | PRN
Start: 2014-06-23 — End: 2014-06-23
  Administered 2014-06-23: 25 mL via ORAL

## 2014-06-23 MED ORDER — FENTANYL CITRATE 0.05 MG/ML IJ SOLN
50.0000 ug | Freq: Once | INTRAMUSCULAR | Status: AC
  Administered 2014-06-23: 50 ug via INTRAVENOUS
  Filled 2014-06-23: qty 2

## 2014-06-23 MED ORDER — DEXTROSE 5 % IV SOLN
1.0000 g | Freq: Once | INTRAVENOUS | Status: AC
  Administered 2014-06-23: 1 g via INTRAVENOUS
  Filled 2014-06-23: qty 10

## 2014-06-23 NOTE — ED Provider Notes (Signed)
CSN: [id number]     Arrival date & time 06/23/14  1811 History   First MD Initiated Contact with Patient 06/23/14 1822     Chief Complaint  Patient presents with  . Medication Reaction  . Dysuria     (Consider location/radiation/quality/duration/timing/severity/associated sxs/prior Treatment) Patient is a 29 y.o. female presenting with dysuria. The history is provided by the patient.  Dysuria Associated symptoms: abdominal pain, fever and vomiting   Associated symptoms: no nausea   Abdominal pain:    Location:  RLQ   Quality:  Aching   Severity:  Mild   Onset quality:  Gradual   Duration: 1-2.   Timing:  Intermittent   Progression:  Worsening   Chronicity:  New   Past Medical History  Diagnosis Date  . Chronic kidney disease   . Hypertension   . Hyperlipidemia   . Diabetes mellitus     Pt reports diagnosis in June 2011   Past Surgical History  Procedure Laterality Date  . Kidney transplant  2007  . Thyroid lobectomy    . Renal biopsy Bilateral 2003   Family History  Problem Relation Age of Onset  . Arthritis Mother   . Diabetes Mother   . Hypertension Mother    History  Substance Use Topics  . Smoking status: Never Smoker   . Smokeless tobacco: Never Used  . Alcohol Use: No   OB History    No data available     Review of Systems  Constitutional: Positive for fever. Negative for fatigue.  HENT: Negative for congestion and drooling.   Eyes: Negative for pain.  Respiratory: Negative for cough and shortness of breath.   Cardiovascular: Negative for chest pain.  Gastrointestinal: Positive for vomiting, abdominal pain and diarrhea. Negative for nausea.  Genitourinary: Positive for dysuria. Negative for hematuria.  Musculoskeletal: Positive for back pain. Negative for gait problem and neck pain.  Skin: Negative for color change.  Neurological: Negative for dizziness and headaches.  Hematological: Negative for adenopathy.  Psychiatric/Behavioral: Negative  for behavioral problems.  All other systems reviewed and are negative.     Allergies  Motrin; Banana; Diphenhydramine hcl; Iron dextran; and Shellfish allergy  Home Medications   Prior to Admission medications   Medication Sig Start Date End Date Taking? Authorizing Provider  calcium carbonate (TUMS) 500 MG chewable tablet Chew 2 tablets by mouth daily.     Historical Provider, MD  Docusate Calcium (STOOL SOFTENER PO) Take 1 tablet by mouth daily as needed (contipation).     Historical Provider, MD  flintstones complete (FLINTSTONES) 60 MG chewable tablet Chew 1 tablet by mouth daily.    Historical Provider, MD  hydrOXYzine (ATARAX) 25 MG tablet Take 25 mg by mouth 2 (two) times daily as needed for itching.     Historical Provider, MD  lisinopril-hydrochlorothiazide (PRINZIDE,ZESTORETIC) 20-25 MG per tablet Take 1 tablet by mouth daily.     Historical Provider, MD  mycophenolate (MYFORTIC) 360 MG TBEC Take 780 mg by mouth 2 (two) times daily.    Historical Provider, MD  predniSONE (DELTASONE) 5 MG tablet Take 5 mg by mouth at bedtime.     Historical Provider, MD  ranitidine (ZANTAC) 150 MG tablet Take 150 mg by mouth daily as needed for heartburn.    Historical Provider, MD  simvastatin (ZOCOR) 20 MG tablet Take 20 mg by mouth every evening.    Historical Provider, MD  sulfamethoxazole-trimethoprim (BACTRIM,SEPTRA) 400-80 MG per tablet Take 1 tablet by mouth every other day.  Historical Provider, MD  tacrolimus (PROGRAF) 1 MG capsule Take 2 mg by mouth 2 (two) times daily.     Historical Provider, MD   BP 113/73 mmHg  Pulse 130  Temp(Src) 103 F (39.4 C) (Oral)  Resp 18  Ht 5\' 6"  (1.676 m)  Wt 158 lb (71.668 kg)  BMI 25.51 kg/m2  SpO2 95% Physical Exam  Constitutional: She is oriented to person, place, and time. She appears well-developed and well-nourished.  HENT:  Head: Normocephalic.  Mouth/Throat: Oropharynx is clear and moist. No oropharyngeal exudate.  Eyes:  Conjunctivae and EOM are normal. Pupils are equal, round, and reactive to light.  Neck: Normal range of motion. Neck supple.  Cardiovascular: Regular rhythm, normal heart sounds and intact distal pulses.  Exam reveals no gallop and no friction rub.   No murmur heard. Pulmonary/Chest: Effort normal and breath sounds normal. No respiratory distress. She has no wheezes.  Abdominal: Soft. Bowel sounds are normal. There is tenderness (mild tenderness to palpation of the right lower quadrant.). There is no rebound and no guarding.  Musculoskeletal: Normal range of motion. She exhibits no edema or tenderness.  Neurological: She is alert and oriented to person, place, and time.  Skin: Skin is warm and dry.  Psychiatric: She has a normal mood and affect. Her behavior is normal.  Nursing note and vitals reviewed.   ED Course  Procedures (including critical care time) Labs Review Labs Reviewed  CBC WITH DIFFERENTIAL/PLATELET - Abnormal; Notable for the following:    WBC 13.9 (*)    RBC 3.58 (*)    Hemoglobin 9.8 (*)    HCT 30.2 (*)    Neutro Abs 10.6 (*)    Monocytes Absolute 1.2 (*)    All other components within normal limits  URINALYSIS, ROUTINE W REFLEX MICROSCOPIC - Abnormal; Notable for the following:    APPearance CLOUDY (*)    Glucose, UA 500 (*)    Protein, ur 100 (*)    Leukocytes, UA SMALL (*)    All other components within normal limits  COMPREHENSIVE METABOLIC PANEL - Abnormal; Notable for the following:    Sodium 130 (*)    Glucose, Bld 316 (*)    BUN 25 (*)    Creatinine, Ser 1.76 (*)    Albumin 2.9 (*)    GFR calc non Af Amer 38 (*)    GFR calc Af Amer 44 (*)    All other components within normal limits  URINE MICROSCOPIC-ADD ON - Abnormal; Notable for the following:    Squamous Epithelial / LPF FEW (*)    Bacteria, UA MANY (*)    All other components within normal limits  URINE CULTURE  CULTURE, BLOOD (ROUTINE X 2)  CULTURE, BLOOD (ROUTINE X 2)  LIPASE, BLOOD   I-STAT CG4 LACTIC ACID, ED  POC URINE PREG, ED  POC URINE PREG, ED    Imaging Review Ct Abdomen Pelvis Wo Contrast  06/23/2014   CLINICAL DATA:  Right lower quadrant abdominal pain. Currently being treated for urinary tract infection. History of renal transplant in 2007. Initial encounter.  EXAM: CT ABDOMEN AND PELVIS WITHOUT CONTRAST  TECHNIQUE: Multidetector CT imaging of the abdomen and pelvis was performed following the standard protocol without IV contrast.  COMPARISON:  PET-CT 05/29/2008.  FINDINGS: Lower chest: Mild atelectasis at both lung bases. There is no consolidation or significant pleural effusion.  Hepatobiliary: As evaluated in the noncontrast state, the liver appears unremarkable without focal abnormality. No evidence of gallstones, gallbladder wall  thickening or biliary dilatation.  Pancreas: Unremarkable. No pancreatic ductal dilatation or surrounding inflammatory changes.  Spleen: Normal in size without focal abnormality.  Adrenals/Urinary Tract: Both adrenal glands appear normal.The left native kidney is markedly atrophied. Native right kidney is not visualized. There is a transplant kidney in the right iliac fossa which appears mildly enlarged compared with the prior study. This measures 16 cm in length. There is new ill-defined low-density within the kidney as well as probable anterior perinephric fluid. Perinephric soft tissue stranding has mildly increased compared with the baseline examination. There is mild dilatation of the transplanted right collecting system. No definite urinary tract calculus. There is mild bladder wall thickening as well as mild perivesical soft tissue stranding.  Stomach/Bowel: No evidence of bowel wall thickening, distention or surrounding inflammatory change.The appendix appears normal.  Vascular/Lymphatic: There are no enlarged abdominal or pelvic lymph nodes. No significant vascular findings are demonstrated on noncontrast imaging.  Reproductive: The  uterus is surgically absent. There is no adnexal mass or other pelvic fluid collection.  Other: No evidence of abdominal wall mass or hernia.  Musculoskeletal: No acute or significant osseous findings. Stable central sclerosis within both femoral heads without typical signs of osteonecrosis or subchondral collapse.  IMPRESSION: 1. Interval enlargement of the transplanted kidney in the right iliac fossa with increased soft tissue stranding, low-density within the kidney and anterior perinephric fluid. Findings are nonspecific on this noncontrast study but suspicious for pyelonephritis. In addition, there is mild collecting system dilatation without evidence of obstructing calculus. 2. No drainable abscess demonstrated. 3. The native left kidney is markedly atrophied. 4. The appendix appears normal.   Electronically Signed   By: Richardean Sale M.D.   On: 06/23/2014 21:48     EKG Interpretation None      MDM   Final diagnoses:  RLQ abdominal pain  Cough  Pyelonephritis    6:38 PM 29 y.o. female w hx of CKD s/p kidney txplt in 2007 ([place]), HTN, HLP, DM on prograf and prednisone who presents with multiple complaints. She states that she developed some dysuria back on March 28 and was prescribed Macrobid over the phone. She was to take it for 10 days but has taken intermittently since that time due to abdominal pain and diarrhea when taking the medicine. Her last dose was yesterday. She feels like her urinary symptoms have mostly resolved. During this time she has also had intermittent right lower quadrant and low back pain. She has had intermittent fevers. Occasional emesis. No blood in her stool or vomit. She presents now due to ongoing right lower quadrant pain and concern for her kidney. She states that she did have a history of rejection about 4 years ago and had a biopsy done. She is febrile here and tachycardic to 1:30. Vital signs otherwise unremarkable. She is also had a mild cough. No qSOFA  criteria.   Will admit to hospitalist for pyelonephritis.   Pamella Pert, MD 06/23/14 [phone]

## 2014-06-23 NOTE — ED Notes (Signed)
MD at bedside. 

## 2014-06-23 NOTE — ED Notes (Addendum)
Pt has been taking nitrofurantoin for UTI intermittently since 3/28. Pt sts she has been taking tylenol for fevers, decreased appetite for several days. Pt sts every time she takes this medication she gets nvd and dry mouth. Pt c/o abdominal pain.

## 2014-06-23 NOTE — ED Notes (Signed)
Pt also reports dry cough.

## 2014-06-24 ENCOUNTER — Encounter (HOSPITAL_COMMUNITY): Payer: Self-pay | Admitting: Internal Medicine

## 2014-06-24 ENCOUNTER — Inpatient Hospital Stay (HOSPITAL_COMMUNITY): Payer: Medicare Other

## 2014-06-24 DIAGNOSIS — A419 Sepsis, unspecified organism: Secondary | ICD-10-CM | POA: Diagnosis not present

## 2014-06-24 DIAGNOSIS — N182 Chronic kidney disease, stage 2 (mild): Secondary | ICD-10-CM | POA: Diagnosis not present

## 2014-06-24 DIAGNOSIS — E1165 Type 2 diabetes mellitus with hyperglycemia: Secondary | ICD-10-CM

## 2014-06-24 DIAGNOSIS — E1129 Type 2 diabetes mellitus with other diabetic kidney complication: Secondary | ICD-10-CM | POA: Diagnosis not present

## 2014-06-24 DIAGNOSIS — Z91018 Allergy to other foods: Secondary | ICD-10-CM | POA: Diagnosis not present

## 2014-06-24 DIAGNOSIS — Z91013 Allergy to seafood: Secondary | ICD-10-CM | POA: Diagnosis not present

## 2014-06-24 DIAGNOSIS — N12 Tubulo-interstitial nephritis, not specified as acute or chronic: Secondary | ICD-10-CM | POA: Diagnosis not present

## 2014-06-24 DIAGNOSIS — R109 Unspecified abdominal pain: Secondary | ICD-10-CM | POA: Diagnosis not present

## 2014-06-24 DIAGNOSIS — Z7952 Long term (current) use of systemic steroids: Secondary | ICD-10-CM | POA: Diagnosis not present

## 2014-06-24 DIAGNOSIS — R079 Chest pain, unspecified: Secondary | ICD-10-CM | POA: Diagnosis not present

## 2014-06-24 DIAGNOSIS — D631 Anemia in chronic kidney disease: Secondary | ICD-10-CM | POA: Diagnosis present

## 2014-06-24 DIAGNOSIS — E785 Hyperlipidemia, unspecified: Secondary | ICD-10-CM | POA: Diagnosis present

## 2014-06-24 DIAGNOSIS — I129 Hypertensive chronic kidney disease with stage 1 through stage 4 chronic kidney disease, or unspecified chronic kidney disease: Secondary | ICD-10-CM | POA: Diagnosis present

## 2014-06-24 DIAGNOSIS — N189 Chronic kidney disease, unspecified: Secondary | ICD-10-CM | POA: Diagnosis not present

## 2014-06-24 DIAGNOSIS — Z794 Long term (current) use of insulin: Secondary | ICD-10-CM | POA: Diagnosis not present

## 2014-06-24 DIAGNOSIS — N183 Chronic kidney disease, stage 3 (moderate): Secondary | ICD-10-CM | POA: Diagnosis present

## 2014-06-24 DIAGNOSIS — Z940 Kidney transplant status: Secondary | ICD-10-CM | POA: Diagnosis not present

## 2014-06-24 DIAGNOSIS — Z888 Allergy status to other drugs, medicaments and biological substances status: Secondary | ICD-10-CM | POA: Diagnosis not present

## 2014-06-24 DIAGNOSIS — R1031 Right lower quadrant pain: Secondary | ICD-10-CM | POA: Diagnosis not present

## 2014-06-24 DIAGNOSIS — J9811 Atelectasis: Secondary | ICD-10-CM | POA: Diagnosis not present

## 2014-06-24 LAB — CREATININE, SERUM
Creatinine, Ser: 1.88 mg/dL — ABNORMAL HIGH (ref 0.50–1.10)
GFR, EST AFRICAN AMERICAN: 40 mL/min — AB (ref >90)
GFR, EST NON AFRICAN AMERICAN: 35 mL/min — AB (ref >90)

## 2014-06-24 LAB — GLUCOSE, CAPILLARY
GLUCOSE-CAPILLARY: 428 mg/dL — AB (ref 70–99)
GLUCOSE-CAPILLARY: 266 mg/dL — AB (ref 70–99)
GLUCOSE-CAPILLARY: 250 mg/dL — AB (ref 70–99)
GLUCOSE-CAPILLARY: 195 mg/dL — AB (ref 70–99)
GLUCOSE-CAPILLARY: 206 mg/dL — AB (ref 70–99)
GLUCOSE-CAPILLARY: 261 mg/dL — AB (ref 70–99)
Glucose-Capillary: 204 mg/dL — ABNORMAL HIGH (ref 70–99)
Glucose-Capillary: 281 mg/dL — ABNORMAL HIGH (ref 70–99)
Glucose-Capillary: 318 mg/dL — ABNORMAL HIGH (ref 70–99)
Glucose-Capillary: 326 mg/dL — ABNORMAL HIGH (ref 70–99)
Glucose-Capillary: 346 mg/dL — ABNORMAL HIGH (ref 70–99)
Glucose-Capillary: 347 mg/dL — ABNORMAL HIGH (ref 70–99)
Glucose-Capillary: 365 mg/dL — ABNORMAL HIGH (ref 70–99)
Glucose-Capillary: 480 mg/dL — ABNORMAL HIGH (ref 70–99)

## 2014-06-24 LAB — COMPREHENSIVE METABOLIC PANEL
ALT: 10 U/L (ref 0–35)
AST: 12 U/L (ref 0–37)
Albumin: 2.4 g/dL — ABNORMAL LOW (ref 3.5–5.2)
Alkaline Phosphatase: 81 U/L (ref 39–117)
Anion gap: 5 (ref 5–15)
BILIRUBIN TOTAL: 0.5 mg/dL (ref 0.3–1.2)
BUN: 24 mg/dL — ABNORMAL HIGH (ref 6–23)
CHLORIDE: 105 mmol/L (ref 96–112)
CO2: 21 mmol/L (ref 19–32)
Calcium: 7.5 mg/dL — ABNORMAL LOW (ref 8.4–10.5)
Creatinine, Ser: 1.71 mg/dL — ABNORMAL HIGH (ref 0.50–1.10)
GFR calc Af Amer: 45 mL/min — ABNORMAL LOW (ref >90)
GFR calc non Af Amer: 39 mL/min — ABNORMAL LOW (ref >90)
Glucose, Bld: 504 mg/dL — ABNORMAL HIGH (ref 70–99)
POTASSIUM: 3.6 mmol/L (ref 3.5–5.1)
SODIUM: 131 mmol/L — AB (ref 135–145)
Total Protein: 7.3 g/dL (ref 6.0–8.3)

## 2014-06-24 LAB — APTT: APTT: 35 s (ref 24–37)

## 2014-06-24 LAB — PROTIME-INR
INR: 1.35 (ref 0.00–1.49)
Prothrombin Time: 16.8 seconds — ABNORMAL HIGH (ref 11.6–15.2)

## 2014-06-24 LAB — CBC WITH DIFFERENTIAL/PLATELET
BASOS ABS: 0 10*3/uL (ref 0.0–0.1)
Basophils Relative: 0 % (ref 0–1)
Eosinophils Absolute: 0 10*3/uL (ref 0.0–0.7)
Eosinophils Relative: 0 % (ref 0–5)
HCT: 28.9 % — ABNORMAL LOW (ref 36.0–46.0)
HEMOGLOBIN: 9.1 g/dL — AB (ref 12.0–15.0)
LYMPHS PCT: 8 % — AB (ref 12–46)
Lymphs Abs: 1 10*3/uL (ref 0.7–4.0)
MCH: 26.8 pg (ref 26.0–34.0)
MCHC: 31.5 g/dL (ref 30.0–36.0)
MCV: 85 fL (ref 78.0–100.0)
MONO ABS: 0.3 10*3/uL (ref 0.1–1.0)
MONOS PCT: 2 % — AB (ref 3–12)
NEUTROS PCT: 90 % — AB (ref 43–77)
Neutro Abs: 11.1 10*3/uL — ABNORMAL HIGH (ref 1.7–7.7)
Platelets: 290 10*3/uL (ref 150–400)
RBC: 3.4 MIL/uL — AB (ref 3.87–5.11)
RDW: 14 % (ref 11.5–15.5)
WBC: 12.4 10*3/uL — ABNORMAL HIGH (ref 4.0–10.5)

## 2014-06-24 LAB — PROCALCITONIN: Procalcitonin: 1.68 ng/mL

## 2014-06-24 LAB — CBC
HCT: 27.4 % — ABNORMAL LOW (ref 36.0–46.0)
Hemoglobin: 8.5 g/dL — ABNORMAL LOW (ref 12.0–15.0)
MCH: 26.7 pg (ref 26.0–34.0)
MCHC: 31 g/dL (ref 30.0–36.0)
MCV: 86.2 fL (ref 78.0–100.0)
Platelets: 265 10*3/uL (ref 150–400)
RBC: 3.18 MIL/uL — ABNORMAL LOW (ref 3.87–5.11)
RDW: 13.9 % (ref 11.5–15.5)
WBC: 13.5 10*3/uL — ABNORMAL HIGH (ref 4.0–10.5)

## 2014-06-24 LAB — LACTIC ACID, PLASMA
LACTIC ACID, VENOUS: 0.7 mmol/L (ref 0.5–2.0)
LACTIC ACID, VENOUS: 0.9 mmol/L (ref 0.5–2.0)

## 2014-06-24 LAB — CLOSTRIDIUM DIFFICILE BY PCR: Toxigenic C. Difficile by PCR: NEGATIVE

## 2014-06-24 MED ORDER — CALCIUM CARBONATE ANTACID 500 MG PO CHEW
2.0000 | CHEWABLE_TABLET | Freq: Every day | ORAL | Status: DC
  Administered 2014-06-24 – 2014-06-25 (×2): 400 mg via ORAL
  Filled 2014-06-24 (×2): qty 2

## 2014-06-24 MED ORDER — HYDROCORTISONE NA SUCCINATE PF 100 MG IJ SOLR
50.0000 mg | Freq: Four times a day (QID) | INTRAMUSCULAR | Status: AC
  Administered 2014-06-24 (×2): 50 mg via INTRAVENOUS
  Filled 2014-06-24 (×3): qty 1

## 2014-06-24 MED ORDER — ACETAMINOPHEN 325 MG PO TABS
650.0000 mg | ORAL_TABLET | Freq: Four times a day (QID) | ORAL | Status: DC | PRN
  Administered 2014-06-24 – 2014-06-25 (×5): 650 mg via ORAL
  Filled 2014-06-24 (×5): qty 2

## 2014-06-24 MED ORDER — CEFTRIAXONE SODIUM IN DEXTROSE 40 MG/ML IV SOLN: 2.0000 g | Freq: Once | INTRAVENOUS | Status: DC

## 2014-06-24 MED ORDER — DEXTROSE-NACL 5-0.45 % IV SOLN: INTRAVENOUS | Status: DC

## 2014-06-24 MED ORDER — ONDANSETRON HCL 4 MG/2ML IJ SOLN: 4.0000 mg | Freq: Four times a day (QID) | INTRAMUSCULAR | Status: DC | PRN

## 2014-06-24 MED ORDER — TACROLIMUS 1 MG PO CAPS
2.0000 mg | ORAL_CAPSULE | Freq: Two times a day (BID) | ORAL | Status: DC
  Administered 2014-06-24 – 2014-06-25 (×3): 2 mg via ORAL
  Filled 2014-06-24 (×5): qty 2

## 2014-06-24 MED ORDER — SODIUM CHLORIDE 0.9 % IV SOLN
INTRAVENOUS | Status: DC
  Administered 2014-06-24: 3.1 [IU]/h via INTRAVENOUS
  Filled 2014-06-24: qty 2.5

## 2014-06-24 MED ORDER — MYCOPHENOLATE SODIUM 180 MG PO TBEC
720.0000 mg | DELAYED_RELEASE_TABLET | Freq: Two times a day (BID) | ORAL | Status: DC
  Administered 2014-06-24 – 2014-06-25 (×3): 720 mg via ORAL
  Filled 2014-06-24 (×5): qty 4

## 2014-06-24 MED ORDER — SODIUM CHLORIDE 0.9 % IV SOLN
INTRAVENOUS | Status: DC
  Administered 2014-06-24 (×2): 125 mL/h via INTRAVENOUS

## 2014-06-24 MED ORDER — ENOXAPARIN SODIUM 40 MG/0.4ML ~~LOC~~ SOLN
40.0000 mg | SUBCUTANEOUS | Status: DC
  Administered 2014-06-24 – 2014-06-25 (×2): 40 mg via SUBCUTANEOUS
  Filled 2014-06-24 (×3): qty 0.4

## 2014-06-24 MED ORDER — INSULIN PUMP
SUBCUTANEOUS | Status: DC
  Administered 2014-06-24 (×2): 1 via SUBCUTANEOUS
  Filled 2014-06-24: qty 1

## 2014-06-24 MED ORDER — INSULIN REGULAR BOLUS VIA INFUSION
0.0000 [IU] | Freq: Three times a day (TID) | INTRAVENOUS | Status: DC
  Administered 2014-06-24: 5.6 [IU] via INTRAVENOUS
  Administered 2014-06-24: 5.3 [IU] via INTRAVENOUS
  Administered 2014-06-24: 1.3 [IU] via INTRAVENOUS
  Filled 2014-06-24: qty 10

## 2014-06-24 MED ORDER — SIMVASTATIN 20 MG PO TABS
20.0000 mg | ORAL_TABLET | Freq: Every evening | ORAL | Status: DC
  Administered 2014-06-24 (×2): 20 mg via ORAL
  Filled 2014-06-24 (×3): qty 1

## 2014-06-24 MED ORDER — PIPERACILLIN-TAZOBACTAM 3.375 G IVPB
3.3750 g | Freq: Three times a day (TID) | INTRAVENOUS | Status: DC
  Administered 2014-06-24 – 2014-06-25 (×4): 3.375 g via INTRAVENOUS
  Filled 2014-06-24 (×7): qty 50

## 2014-06-24 MED ORDER — CEFTRIAXONE SODIUM IN DEXTROSE 20 MG/ML IV SOLN
1.0000 g | INTRAVENOUS | Status: DC
  Filled 2014-06-24: qty 50

## 2014-06-24 MED ORDER — FAMOTIDINE 20 MG PO TABS
20.0000 mg | ORAL_TABLET | Freq: Two times a day (BID) | ORAL | Status: DC
  Administered 2014-06-24 – 2014-06-25 (×4): 20 mg via ORAL
  Filled 2014-06-24 (×5): qty 1

## 2014-06-24 MED ORDER — DEXTROSE 50 % IV SOLN: 25.0000 mL | INTRAVENOUS | Status: DC | PRN

## 2014-06-24 MED ORDER — ONDANSETRON HCL 4 MG PO TABS: 4.0000 mg | ORAL_TABLET | Freq: Four times a day (QID) | ORAL | Status: DC | PRN

## 2014-06-24 MED ORDER — HYDROXYZINE HCL 25 MG PO TABS
25.0000 mg | ORAL_TABLET | Freq: Two times a day (BID) | ORAL | Status: DC | PRN
Start: 2014-06-24 — End: 2014-06-25

## 2014-06-24 MED ORDER — ACETAMINOPHEN 650 MG RE SUPP: 650.0000 mg | Freq: Four times a day (QID) | RECTAL | Status: DC | PRN

## 2014-06-24 MED ORDER — SODIUM CHLORIDE 0.9 % IV SOLN: INTRAVENOUS | Status: DC

## 2014-06-24 MED ORDER — PREDNISONE 5 MG PO TABS
5.0000 mg | ORAL_TABLET | Freq: Every day | ORAL | Status: DC
  Administered 2014-06-24: 5 mg via ORAL
  Filled 2014-06-24 (×3): qty 1

## 2014-06-24 NOTE — Progress Notes (Signed)
[facility] PROGRESS NOTE  [name] [id number] DOB: December 30, 1985 DOA: 06/23/2014 PCP: Elyn Peers, MD  Assessment/Plan: 29 y/o female with PMH of HTN, DM, h/o Transplanted kidney (2007), CKD, recently started on oral atx for uti as outpatient presented with fevers, chills  -admitted with sepsis, UTI, pyelonephritis, uncontrolled DM   1. UTI/Pyelonephritis/sepsis; CT abd: probable pyelonephritis of transplanted kidney; (failed outpatient atx) -started IV atx (will change to zosyn due to immunosuppression/failed outpatient atx), pend blood, urine cultures; IVF as needed; received short IV steroids;   2. Nausea, vomiting, Diarrhea in the setting of pyelonephritis; ct abd as above;  -will obtain c diff; cont IVF, antiemetics;  3. CKD-II-III; h/o transplanted kidney; renal function seems stable;  -will obtain nephrology eval due to pyelonephritis of the transplanted kidney; monitor urine output, renal function; resume prednisone,  4. DM with hyperglycemia; uncontrolled on admission; on insulin pump at home;  -currently on IV insulin drip due to uncontrolled hyperglycemia; will check ha1c; resume pump when glucose is better controlled  3. Anemia, AoCD; no s/s of acute bleeding; cont monitor;    Code Status: full Family Communication: d/w patient, no family at the bedside  (indicate person spoken with, relationship, and if by phone, the number) Disposition Plan: pend clinical improvement    Consultants:  Nephrology   Procedures:  Echo   Antibiotics:  Ceftriaxone 4/10  Zosyn 4/11<<<<   (indicate start date, and stop date if known)  HPI/Subjective: Alert, oriented   Objective: Filed Vitals:   06/23/14 2338  BP: 120/80  Pulse: 102  Temp: 99.7 F (37.6 C)  Resp: 17    Intake/Output Summary (Last 24 hours) at 06/24/14 1119 Last data filed at 06/24/14 0800  Gross per 24 hour  Intake 860.42 ml  Output    600 ml  Net 260.42 ml   Filed Weights   06/23/14  1817 06/23/14 2338  Weight: 71.668 kg (158 lb) 75.479 kg (166 lb 6.4 oz)    Exam:   General:  Alert, no distress   Cardiovascular: s1,s2 rrr  Respiratory: CTA BL  Abdomen: soft, nt,nd   Musculoskeletal: no   Data Reviewed: Basic Metabolic Panel:  Recent Labs Lab 06/23/14 1845 06/24/14 0117 06/24/14 0447  NA 130*  --  131*  K 3.5  --  3.6  CL 99  --  105  CO2 23  --  21  GLUCOSE 316*  --  504*  BUN 25*  --  24*  CREATININE 1.76* 1.88* 1.71*  CALCIUM 8.4  --  7.5*   Liver Function Tests:  Recent Labs Lab 06/23/14 1845 06/24/14 0447  AST 14 12  ALT 10 10  ALKPHOS 89 81  BILITOT 0.7 0.5  PROT 8.0 7.3  ALBUMIN 2.9* 2.4*    Recent Labs Lab 06/23/14 1840  LIPASE 21   No results for input(s): AMMONIA in the last 168 hours. CBC:  Recent Labs Lab 06/23/14 1845 06/24/14 0117 06/24/14 0447  WBC 13.9* 13.5* 12.4*  NEUTROABS 10.6*  --  11.1*  HGB 9.8* 8.5* 9.1*  HCT 30.2* 27.4* 28.9*  MCV 84.4 86.2 85.0  PLT 300 265 290   Cardiac Enzymes: No results for input(s): CKTOTAL, CKMB, CKMBINDEX, TROPONINI in the last 168 hours. BNP (last 3 results) No results for input(s): BNP in the last 8760 hours.  ProBNP (last 3 results) No results for input(s): PROBNP in the last 8760 hours.  CBG:  Recent Labs Lab 06/24/14 0007 06/24/14 0412 06/24/14 0535 06/24/14 0749  GLUCAP  346* 428* 480* 347*    Recent Results (from the past 240 hour(s))  Clostridium Difficile by PCR     Status: None   Collection Time: 06/24/14  8:43 AM  Result Value Ref Range Status   C difficile by pcr NEGATIVE NEGATIVE Final     Studies: Ct Abdomen Pelvis Wo Contrast  06/23/2014   CLINICAL DATA:  Right lower quadrant abdominal pain. Currently being treated for urinary tract infection. History of renal transplant in 2007. Initial encounter.  EXAM: CT ABDOMEN AND PELVIS WITHOUT CONTRAST  TECHNIQUE: Multidetector CT imaging of the abdomen and pelvis was performed following the  standard protocol without IV contrast.  COMPARISON:  PET-CT 05/29/2008.  FINDINGS: Lower chest: Mild atelectasis at both lung bases. There is no consolidation or significant pleural effusion.  Hepatobiliary: As evaluated in the noncontrast state, the liver appears unremarkable without focal abnormality. No evidence of gallstones, gallbladder wall thickening or biliary dilatation.  Pancreas: Unremarkable. No pancreatic ductal dilatation or surrounding inflammatory changes.  Spleen: Normal in size without focal abnormality.  Adrenals/Urinary Tract: Both adrenal glands appear normal.The left native kidney is markedly atrophied. Native right kidney is not visualized. There is a transplant kidney in the right iliac fossa which appears mildly enlarged compared with the prior study. This measures 16 cm in length. There is new ill-defined low-density within the kidney as well as probable anterior perinephric fluid. Perinephric soft tissue stranding has mildly increased compared with the baseline examination. There is mild dilatation of the transplanted right collecting system. No definite urinary tract calculus. There is mild bladder wall thickening as well as mild perivesical soft tissue stranding.  Stomach/Bowel: No evidence of bowel wall thickening, distention or surrounding inflammatory change.The appendix appears normal.  Vascular/Lymphatic: There are no enlarged abdominal or pelvic lymph nodes. No significant vascular findings are demonstrated on noncontrast imaging.  Reproductive: The uterus is surgically absent. There is no adnexal mass or other pelvic fluid collection.  Other: No evidence of abdominal wall mass or hernia.  Musculoskeletal: No acute or significant osseous findings. Stable central sclerosis within both femoral heads without typical signs of osteonecrosis or subchondral collapse.  IMPRESSION: 1. Interval enlargement of the transplanted kidney in the right iliac fossa with increased soft tissue  stranding, low-density within the kidney and anterior perinephric fluid. Findings are nonspecific on this noncontrast study but suspicious for pyelonephritis. In addition, there is mild collecting system dilatation without evidence of obstructing calculus. 2. No drainable abscess demonstrated. 3. The native left kidney is markedly atrophied. 4. The appendix appears normal.   Electronically Signed   By: Richardean Sale M.D.   On: 06/23/2014 21:48   Dg Chest 2 View  06/23/2014   CLINICAL DATA:  Dry cough for 3 days, history of hypertension and diabetes.  EXAM: CHEST  2 VIEW  COMPARISON:  Chest radiograph August 24, 2004  FINDINGS: The cardiac silhouette is upper limits of normal in size, mediastinal silhouette is nonsuspicious. No pleural effusion or focal consolidation. No pneumothorax. Soft tissue planes and included osseous structures are normal.  IMPRESSION: Borderline cardiomegaly, no acute pulmonary process.   Electronically Signed   By: Elon Alas   On: 06/23/2014 22:56   Dg Chest Port 1 View  06/24/2014   CLINICAL DATA:  Sepsis.  EXAM: PORTABLE CHEST - 1 VIEW  COMPARISON:  06/23/2014  FINDINGS: Hypoventilation with streaky lower lung opacities consistent with atelectasis. This is confirmed on abdominal CT from late yesterday. Normal heart size and mediastinal contours for technique.  IMPRESSION: Low lung volumes with mild basilar atelectasis.   Electronically Signed   By: Monte Fantasia M.D.   On: 06/24/2014 02:02    Scheduled Meds: . calcium carbonate  2 tablet Oral Daily  . cefTRIAXone (ROCEPHIN)  IV  1 g Intravenous Q24H  . enoxaparin (LOVENOX) injection  40 mg Subcutaneous Q24H  . famotidine  20 mg Oral BID  . hydrocortisone sod succinate (SOLU-CORTEF) inj  50 mg Intravenous Q6H  . insulin regular  0-10 Units Intravenous TID WC  . mycophenolate  720 mg Oral BID  . predniSONE  5 mg Oral QHS  . simvastatin  20 mg Oral QPM  . tacrolimus  2 mg Oral BID   Continuous Infusions: .  sodium chloride 125 mL/hr (06/24/14 0427)  . dextrose 5 % and 0.45% NaCl    . insulin (NOVOLIN-R) infusion 7.7 Units/hr (06/24/14 1100)    Principal Problem:   Sepsis Active Problems:   DM (diabetes mellitus), type 2, uncontrolled   Anemia in chronic renal disease   CHRONIC KIDNEY DISEASE STAGE II (MILD)   KIDNEY TRANSPLANTATION, HX OF   Pyelonephritis    Time spent: >35 minutes     Kinnie Feil  [facility] Pager [phone]. If 7PM-7AM, please contact night-coverage at [email or web address], password [facility] 06/24/2014, 11:19 AM  LOS: 0 days

## 2014-06-24 NOTE — H&P (Signed)
[facility] History and Physical  [name] [id number] DOB: 01-15-1986 DOA: 06/23/2014  Referring physician: ER physician. PCP: Elyn Peers, MD  Chief Complaint: Abdominal pain and urinary frequency.  HPI: [name] is a 29 y.o. female with history of transplanted kidney, chronic kidney disease, hypertension has not been taking antihypertensives for last 1 month due to low blood pressure and diabetes mellitus type 2 on insulin pump presents to the ER because of increasing pain in the abdomen around the transplanted kidney area and the increasing dysuria. Patient states her symptoms started 2 weeks ago and her nephrologist had prescribed antibiotics which patient has not been taking consistently because it causes abdominal pain. Patient came to ER because of worsening abdominal pain with nausea vomiting and diarrhea. Patient has been having nausea vomiting diarrhea last 2 days and unable to keep anything. In the ER CT abdomen and pelvis done shows features concerning for pyelonephritis. Patient has been started on antibiotics for sepsis secondary to pyelonephritis and admitted for further management. Patient otherwise denies any chest pain or shortness of breath. Patient but she was also found to be elevated.   Review of Systems: As presented in the history of presenting illness, rest negative.  Past Medical History  Diagnosis Date  . Chronic kidney disease   . Hypertension   . Hyperlipidemia   . Diabetes mellitus     Pt reports diagnosis in June 2011   Past Surgical History  Procedure Laterality Date  . Kidney transplant  2007  . Thyroid lobectomy    . Renal biopsy Bilateral 2003   Social History:  reports that she has never smoked. She has never used smokeless tobacco. She reports that she does not drink alcohol or use illicit drugs. Where does patient live home. Can patient participate in ADLs? Yes.  Allergies  Allergen Reactions  . Motrin [Ibuprofen] Shortness  Of Breath and Itching    Per pt  . Banana Other (See Comments)    Sick on the stomach  . Diphenhydramine Hcl     REACTION: Stopped breathing in ICU  . Iron Dextran     REACTION: vein irritation  . Shellfish Allergy Hives    Family History:  Family History  Problem Relation Age of Onset  . Arthritis Mother   . Diabetes Mother   . Hypertension Mother       Prior to Admission medications   Medication Sig Start Date End Date Taking? Authorizing Provider  lisinopril-hydrochlorothiazide (PRINZIDE,ZESTORETIC) 20-25 MG per tablet Take 1 tablet by mouth daily.    Yes Historical Provider, MD  mycophenolate (MYFORTIC) 360 MG TBEC Take 780 mg by mouth 2 (two) times daily.   Yes Historical Provider, MD  nitrofurantoin (MACRODANTIN) 100 MG capsule Take 100 mg by mouth 2 (two) times daily. 06/10/14  Yes Historical Provider, MD  predniSONE (DELTASONE) 5 MG tablet Take 5 mg by mouth at bedtime.    Yes Historical Provider, MD  simvastatin (ZOCOR) 20 MG tablet Take 20 mg by mouth every evening.   Yes Historical Provider, MD  tacrolimus (PROGRAF) 1 MG capsule Take 2 mg by mouth 2 (two) times daily.    Yes Historical Provider, MD  calcium carbonate (TUMS) 500 MG chewable tablet Chew 2 tablets by mouth daily.     Historical Provider, MD  Docusate Calcium (STOOL SOFTENER PO) Take 1 tablet by mouth daily as needed (contipation).     Historical Provider, MD  flintstones complete (FLINTSTONES) 60 MG chewable tablet Chew 1 tablet  by mouth daily.    Historical Provider, MD  hydrOXYzine (ATARAX) 25 MG tablet Take 25 mg by mouth 2 (two) times daily as needed for itching.     Historical Provider, MD  NOVOLOG 100 UNIT/ML injection  06/16/14   Historical Provider, MD  ranitidine (ZANTAC) 150 MG tablet Take 150 mg by mouth daily as needed for heartburn.    Historical Provider, MD  sulfamethoxazole-trimethoprim (BACTRIM,SEPTRA) 400-80 MG per tablet Take 1 tablet by mouth every other day.     Historical Provider, MD     Physical Exam: Filed Vitals:   06/23/14 2149 06/23/14 2243 06/23/14 2245 06/23/14 2338  BP:  110/74 107/64 120/80  Pulse:  97 96 102  Temp: 98.9 F (37.2 C)   99.7 F (37.6 C)  TempSrc: Oral   Oral  Resp:    17  Height:      Weight:    75.479 kg (166 lb 6.4 oz)  SpO2:  98% 99% 100%     General:  Well-developed and nourished.  Eyes: Anicteric no pallor.  ENT: No discharge from the ears eyes nose and mouth.  Neck: No mass felt.  Cardiovascular: S1 and S2 heard.  Respiratory: No rhonchi or crepitations.  Abdomen: Soft mild tenderness diffusely no guarding or rigidity.  Skin: No rash.  Musculoskeletal: No edema.  Psychiatric: Appears normal.  Neurologic: Alert awake oriented to time place and person. Moves all extremities.  Labs on Admission:  Basic Metabolic Panel:  Recent Labs Lab 06/23/14 1845  NA 130*  K 3.5  CL 99  CO2 23  GLUCOSE 316*  BUN 25*  CREATININE 1.76*  CALCIUM 8.4   Liver Function Tests:  Recent Labs Lab 06/23/14 1845  AST 14  ALT 10  ALKPHOS 89  BILITOT 0.7  PROT 8.0  ALBUMIN 2.9*    Recent Labs Lab 06/23/14 1840  LIPASE 21   No results for input(s): AMMONIA in the last 168 hours. CBC:  Recent Labs Lab 06/23/14 1845  WBC 13.9*  NEUTROABS 10.6*  HGB 9.8*  HCT 30.2*  MCV 84.4  PLT 300   Cardiac Enzymes: No results for input(s): CKTOTAL, CKMB, CKMBINDEX, TROPONINI in the last 168 hours.  BNP (last 3 results) No results for input(s): BNP in the last 8760 hours.  ProBNP (last 3 results) No results for input(s): PROBNP in the last 8760 hours.  CBG:  Recent Labs Lab 06/24/14 0007  GLUCAP 346*    Radiological Exams on Admission: Ct Abdomen Pelvis Wo Contrast  06/23/2014   CLINICAL DATA:  Right lower quadrant abdominal pain. Currently being treated for urinary tract infection. History of renal transplant in 2007. Initial encounter.  EXAM: CT ABDOMEN AND PELVIS WITHOUT CONTRAST  TECHNIQUE: Multidetector CT  imaging of the abdomen and pelvis was performed following the standard protocol without IV contrast.  COMPARISON:  PET-CT 05/29/2008.  FINDINGS: Lower chest: Mild atelectasis at both lung bases. There is no consolidation or significant pleural effusion.  Hepatobiliary: As evaluated in the noncontrast state, the liver appears unremarkable without focal abnormality. No evidence of gallstones, gallbladder wall thickening or biliary dilatation.  Pancreas: Unremarkable. No pancreatic ductal dilatation or surrounding inflammatory changes.  Spleen: Normal in size without focal abnormality.  Adrenals/Urinary Tract: Both adrenal glands appear normal.The left native kidney is markedly atrophied. Native right kidney is not visualized. There is a transplant kidney in the right iliac fossa which appears mildly enlarged compared with the prior study. This measures 16 cm in length. There is new  ill-defined low-density within the kidney as well as probable anterior perinephric fluid. Perinephric soft tissue stranding has mildly increased compared with the baseline examination. There is mild dilatation of the transplanted right collecting system. No definite urinary tract calculus. There is mild bladder wall thickening as well as mild perivesical soft tissue stranding.  Stomach/Bowel: No evidence of bowel wall thickening, distention or surrounding inflammatory change.The appendix appears normal.  Vascular/Lymphatic: There are no enlarged abdominal or pelvic lymph nodes. No significant vascular findings are demonstrated on noncontrast imaging.  Reproductive: The uterus is surgically absent. There is no adnexal mass or other pelvic fluid collection.  Other: No evidence of abdominal wall mass or hernia.  Musculoskeletal: No acute or significant osseous findings. Stable central sclerosis within both femoral heads without typical signs of osteonecrosis or subchondral collapse.  IMPRESSION: 1. Interval enlargement of the transplanted  kidney in the right iliac fossa with increased soft tissue stranding, low-density within the kidney and anterior perinephric fluid. Findings are nonspecific on this noncontrast study but suspicious for pyelonephritis. In addition, there is mild collecting system dilatation without evidence of obstructing calculus. 2. No drainable abscess demonstrated. 3. The native left kidney is markedly atrophied. 4. The appendix appears normal.   Electronically Signed   By: Richardean Sale M.D.   On: 06/23/2014 21:48   Dg Chest 2 View  06/23/2014   CLINICAL DATA:  Dry cough for 3 days, history of hypertension and diabetes.  EXAM: CHEST  2 VIEW  COMPARISON:  Chest radiograph August 24, 2004  FINDINGS: The cardiac silhouette is upper limits of normal in size, mediastinal silhouette is nonsuspicious. No pleural effusion or focal consolidation. No pneumothorax. Soft tissue planes and included osseous structures are normal.  IMPRESSION: Borderline cardiomegaly, no acute pulmonary process.   Electronically Signed   By: Elon Alas   On: 06/23/2014 22:56     Assessment/Plan Principal Problem:   Sepsis Active Problems:   DM (diabetes mellitus), type 2, uncontrolled   Anemia in chronic renal disease   CHRONIC KIDNEY DISEASE STAGE II (MILD)   KIDNEY TRANSPLANTATION, HX OF   Pyelonephritis   1. Sepsis secondary to pyelonephritis - patient has been placed on sepsis protocol and patient is on ceftriaxone. Follow urine cultures and blood cultures and procalcitonin levels. Continue with aggressive IV hydration. Follow lactic acid levels.  2. Diabetes mellitus type 2 uncontrolled - the patient's blood pressure does not get controlled with insulin pump I will change patient to IV insulin infusion. Closely follow metabolic panel and continue with hydration. 3. Chronic kidney disease with renal transplant - since patient is on chronic steroids I have placed patient on 2 doses of IV hydrocortisone and may continue patient's  blood pressure remains low. Continue with aggressive hydration and closely follow intake output and metabolic panel. May discuss with nephrologist in a.m. Continue immunosuppressants. 4. Chronic anemia secondary to chronic kidney disease - follow CBC. 5. Hypertension patient has not been taking antihypertensives for last 1 month due to hypotension - closely follow blood pressure trends.   DVT Prophylaxis Lovenox.  Code Status: Full code.  Family Communication: None.  Disposition Plan: Admit to inpatient.    Daoud Lobue N. [facility] Pager [phone].  If 7PM-7AM, please contact night-coverage [email or web address] Password [facility] 06/24/2014, 12:33 AM

## 2014-06-24 NOTE — Progress Notes (Signed)
Pt is refusing her labs and Glucostablizer she states that this is not why she came to hospital and that the reason her blood sugar is elevated is due to due to prednisone and that she is not a diabetic and her blood sugars are always high and her medical Md knows and is treating her. Arthor Captain LPN

## 2014-06-24 NOTE — Progress Notes (Signed)
06/24/2014 1500 Chart reviewed. Utilization review completed. Jonnie Finner RN CCM Case Mgmt phone [phone]

## 2014-06-24 NOTE — Progress Notes (Signed)
[name] [id number] Admitted to [id number]: 06/24/2014 12:05 AM Attending Provider: Rise Patience, MD    [name] [name] is a 29 y.o. female patient admitted from ED awake, alert  & orientated  X 3,  Prior, VSS - Blood pressure 120/80, pulse 102, temperature 99.7 F (37.6 C), temperature source Oral, resp. rate 17, height 5\' 6"  (1.676 m), weight 75.479 kg (166 lb 6.4 oz), SpO2 100 %.RA, no c/o shortness of breath, no c/o chest pain, no distress noted.   IV site WDL:  with a transparent dsg that's clean dry and intact.  Allergies:   Allergies  Allergen Reactions  . Motrin [Ibuprofen] Shortness Of Breath and Itching    Per pt  . Banana Other (See Comments)    Sick on the stomach  . Diphenhydramine Hcl     REACTION: Stopped breathing in ICU  . Iron Dextran     REACTION: vein irritation  . Shellfish Allergy Hives     Past Medical History  Diagnosis Date  . Chronic kidney disease   . Hypertension   . Hyperlipidemia   . Diabetes mellitus     Pt reports diagnosis in June 2011    History:  obtained from patient  Pt orientation to unit, room and routine. Information packet given to patient and safety video watched.  Admission INP armband ID verified with patient, and in place. SR up x 2, fall risk assessment complete with Patient verbalizing understanding of risks associated with falls. Pt verbalizes an understanding of how to use the call bell and to call for help before getting out of bed.  Skin, clean-dry- intact without evidence of bruising, or skin tears.   No evidence of skin break down noted on exam.  Will cont to monitor and assist as needed.  Valerie Ames, RN 06/24/2014 12:05 AM

## 2014-06-24 NOTE — Progress Notes (Signed)
Pt texted her diabetes counselor about her CBG. Told pt to let RN know if there are any changes.

## 2014-06-24 NOTE — Consult Note (Signed)
[name] is a 29 y.o. female with history of transplanted kidney, chronic kidney disease, hypertension has not been taking antihypertensives for last 1 month due to low blood pressure and diabetes mellitus type 2 on insulin pump presents to the ER because of increasing pain in the abdomen around the transplanted kidney area and the increasing dysuria. Patient states her symptoms started 2 weeks ago and her nephrologist and PCP had prescribed antibiotics which patient had not been taking consistently because it caused abdominal pain. Patient came to ER because of worsening abdominal pain with nausea vomiting and diarrhea. Patient has been having nausea vomiting diarrhea for 2 days pta and unable to keep down anything. In the ER CT abdomen and pelvis done shows features concerning for pyelonephritis of the transplanted kidney.  She has a baseline creatinine ranging from 1.5 to 1.15m/dl according to Dr. DArmandina Gemmanephrologist).  No voiding issues.  She wants to go home "to take care of some things".  Past Medical History  Diagnosis Date  . Chronic kidney disease   . Hypertension   . Hyperlipidemia   . Diabetes mellitus     Pt reports diagnosis in June 2011   Past Surgical History  Procedure Laterality Date  . Kidney transplant  2007  . Thyroid lobectomy    . Renal biopsy Bilateral 2003   Social History:  reports that she has never smoked. She has never used smokeless tobacco. She reports that she does not drink alcohol or use illicit drugs. Allergies:  Allergies  Allergen Reactions  . Motrin [Ibuprofen] Shortness Of Breath and Itching    Per pt  . Banana Other (See Comments)    Sick on the stomach  . Diphenhydramine Hcl     REACTION: Stopped breathing in ICU  . Iron Dextran     REACTION: vein irritation  . Shellfish Allergy Hives   Family History  Problem Relation Age of Onset  . Arthritis Mother   . Diabetes Mother   . Hypertension Mother     Medications:  Prior to  Admission:  Prescriptions prior to admission  Medication Sig Dispense Refill Last Dose  . calcium carbonate (TUMS) 500 MG chewable tablet Chew 2 tablets by mouth 3 (three) times a week. Takes on Monday, wednesdays, and fridays   06/21/2014  . Docusate Calcium (STOOL SOFTENER PO) Take 1 tablet by mouth daily as needed (contipation).    over 30 days  . flintstones complete (FLINTSTONES) 60 MG chewable tablet Chew 1 tablet by mouth daily as needed (immune system).    over 30 days  . hydrOXYzine (ATARAX) 25 MG tablet Take 25 mg by mouth 2 (two) times daily as needed for itching.    06/22/2014  . lisinopril-hydrochlorothiazide (PRINZIDE,ZESTORETIC) 20-25 MG per tablet Take 0.5 tablets by mouth daily.    1 week  . mycophenolate (MYFORTIC) 360 MG TBEC Take 720 mg by mouth 2 (two) times daily.    06/23/2014 at Unknown time  . nitrofurantoin (MACRODANTIN) 100 MG capsule Take 100 mg by mouth 2 (two) times daily.  0 06/21/2014 at Unknown time  . NOVOLOG 100 UNIT/ML injection Inject 0-18.5 Units into the skin 3 (three) times daily with meals.   5 06/22/2014  . POLY-IRON 150 150 MG capsule Take 150 mg by mouth 2 (two) times daily.  5 Past Week at Unknown time  . predniSONE (DELTASONE) 5 MG tablet Take 5 mg by mouth at bedtime.    06/22/2014 at Unknown time  . ranitidine (ZANTAC) 150  MG tablet Take 150 mg by mouth daily as needed for heartburn.   over 30 days  . simvastatin (ZOCOR) 20 MG tablet Take 20 mg by mouth every evening.   06/22/2014 at Unknown time  . tacrolimus (PROGRAF) 1 MG capsule Take 2 mg by mouth 2 (two) times daily.    06/23/2014 at Unknown time  . sulfamethoxazole-trimethoprim (BACTRIM,SEPTRA) 400-80 MG per tablet Take 1 tablet by mouth every other day.    06/22/2014   Scheduled: . calcium carbonate  2 tablet Oral Daily  . enoxaparin (LOVENOX) injection  40 mg Subcutaneous Q24H  . famotidine  20 mg Oral BID  . insulin regular  0-10 Units Intravenous TID WC  . mycophenolate  720 mg Oral BID  .  piperacillin-tazobactam (ZOSYN)  IV  3.375 g Intravenous 3 times per day  . predniSONE  5 mg Oral QHS  . simvastatin  20 mg Oral QPM  . tacrolimus  2 mg Oral BID   ROS: no other complaints and otherwise negative.  Blood pressure 119/84, pulse 82, temperature 98 F (36.7 C), temperature source Oral, resp. rate 17, height _0  (1.676 m), weight 75.479 kg (166 lb 6.4 oz), SpO2 100 %.  General appearance: alert, cooperative and appears stated age Head: Normocephalic, without obvious abnormality, atraumatic Resp: clear to auscultation bilaterally Chest wall: no tenderness Cardio: regular rate and rhythm, S1, S2 normal, no murmur, click, rub or gallop GI: renal allograft RLQ is tender Extremities: extremities normal, atraumatic, no cyanosis or edema Skin: Skin color, texture, turgor normal. No rashes or lesions Neurologic: Grossly normal Results for orders placed or performed during the hospital encounter of 06/23/14 (from the past 48 hour(s))  Lipase, blood     Status: None   Collection Time: 06/23/14  6:40 PM  Result Value Ref Range   Lipase 21 11 - 59 U/L  CBC with Differential     Status: Abnormal   Collection Time: 06/23/14  6:45 PM  Result Value Ref Range   WBC 13.9 (H) 4.0 - 10.5 K/uL   RBC 3.58 (L) 3.87 - 5.11 MIL/uL   Hemoglobin 9.8 (L) 12.0 - 15.0 g/dL   HCT 30.2 (L) 36.0 - 46.0 %   MCV 84.4 78.0 - 100.0 fL   MCH 27.4 26.0 - 34.0 pg   MCHC 32.5 30.0 - 36.0 g/dL   RDW 13.8 11.5 - 15.5 %   Platelets 300 150 - 400 K/uL   Neutrophils Relative % 76 43 - 77 %   Neutro Abs 10.6 (H) 1.7 - 7.7 K/uL   Lymphocytes Relative 14 12 - 46 %   Lymphs Abs 2.0 0.7 - 4.0 K/uL   Monocytes Relative 9 3 - 12 %   Monocytes Absolute 1.2 (H) 0.1 - 1.0 K/uL   Eosinophils Relative 1 0 - 5 %   Eosinophils Absolute 0.1 0.0 - 0.7 K/uL   Basophils Relative 0 0 - 1 %   Basophils Absolute 0.0 0.0 - 0.1 K/uL  Comprehensive metabolic panel     Status: Abnormal   Collection Time: 06/23/14  6:45 PM   Result Value Ref Range   Sodium 130 (L) 135 - 145 mmol/L   Potassium 3.5 3.5 - 5.1 mmol/L   Chloride 99 96 - 112 mmol/L   CO2 23 19 - 32 mmol/L   Glucose, Bld 316 (H) 70 - 99 mg/dL   BUN 25 (H) 6 - 23 mg/dL   Creatinine, Ser 1.76 (H) 0.50 - 1.10 mg/dL   Calcium  8.4 8.4 - 10.5 mg/dL   Total Protein 8.0 6.0 - 8.3 g/dL   Albumin 2.9 (L) 3.5 - 5.2 g/dL   AST 14 0 - 37 U/L   ALT 10 0 - 35 U/L   Alkaline Phosphatase 89 39 - 117 U/L   Total Bilirubin 0.7 0.3 - 1.2 mg/dL   GFR calc non Af Amer 38 (L) >90 mL/min   GFR calc Af Amer 44 (L) >90 mL/min    Comment: (NOTE) The eGFR has been calculated using the CKD EPI equation. This calculation has not been validated in all clinical situations. eGFR's persistently <90 mL/min signify possible Chronic Kidney Disease.    Anion gap 8 5 - 15  Urinalysis, Routine w reflex microscopic     Status: Abnormal   Collection Time: 06/23/14  6:58 PM  Result Value Ref Range   Color, Urine YELLOW YELLOW   APPearance CLOUDY (A) CLEAR   Specific Gravity, Urine 1.016 1.005 - 1.030   pH 5.0 5.0 - 8.0   Glucose, UA 500 (A) NEGATIVE mg/dL   Hgb urine dipstick NEGATIVE NEGATIVE   Bilirubin Urine NEGATIVE NEGATIVE   Ketones, ur NEGATIVE NEGATIVE mg/dL   Protein, ur 100 (A) NEGATIVE mg/dL   Urobilinogen, UA 0.2 0.0 - 1.0 mg/dL   Nitrite NEGATIVE NEGATIVE   Leukocytes, UA SMALL (A) NEGATIVE  Urine microscopic-add on     Status: Abnormal   Collection Time: 06/23/14  6:58 PM  Result Value Ref Range   Squamous Epithelial / LPF FEW (A) RARE   WBC, UA 7-10 <3 WBC/hpf   Bacteria, UA MANY (A) RARE  I-Stat CG4 Lactic Acid, ED     Status: None   Collection Time: 06/23/14  6:59 PM  Result Value Ref Range   Lactic Acid, Venous 1.35 0.5 - 2.0 mmol/L  POC Urine Pregnancy, ED (do NOT order at [facility])     Status: None   Collection Time: 06/23/14  7:15 PM  Result Value Ref Range   Preg Test, Ur NEGATIVE NEGATIVE    Comment:        THE SENSITIVITY OF THIS METHODOLOGY IS  >24 mIU/mL   Glucose, capillary     Status: Abnormal   Collection Time: 06/24/14 12:07 AM  Result Value Ref Range   Glucose-Capillary 346 (H) 70 - 99 mg/dL  CBC     Status: Abnormal   Collection Time: 06/24/14  1:17 AM  Result Value Ref Range   WBC 13.5 (H) 4.0 - 10.5 K/uL   RBC 3.18 (L) 3.87 - 5.11 MIL/uL   Hemoglobin 8.5 (L) 12.0 - 15.0 g/dL   HCT 27.4 (L) 36.0 - 46.0 %   MCV 86.2 78.0 - 100.0 fL   MCH 26.7 26.0 - 34.0 pg   MCHC 31.0 30.0 - 36.0 g/dL   RDW 13.9 11.5 - 15.5 %   Platelets 265 150 - 400 K/uL  Creatinine, serum     Status: Abnormal   Collection Time: 06/24/14  1:17 AM  Result Value Ref Range   Creatinine, Ser 1.88 (H) 0.50 - 1.10 mg/dL   GFR calc non Af Amer 35 (L) >90 mL/min   GFR calc Af Amer 40 (L) >90 mL/min    Comment: (NOTE) The eGFR has been calculated using the CKD EPI equation. This calculation has not been validated in all clinical situations. eGFR's persistently <90 mL/min signify possible Chronic Kidney Disease.   Lactic acid, plasma     Status: None   Collection Time: 06/24/14  1:17 AM  Result Value Ref Range   Lactic Acid, Venous 0.7 0.5 - 2.0 mmol/L  Procalcitonin     Status: None   Collection Time: 06/24/14  1:17 AM  Result Value Ref Range   Procalcitonin 1.68 ng/mL    Comment:        Interpretation: PCT > 0.5 ng/mL and <= 2 ng/mL: Systemic infection (sepsis) is possible, but other conditions are known to elevate PCT as well. (NOTE)         ICU PCT Algorithm               Non ICU PCT Algorithm    ----------------------------     ------------------------------         PCT < 0.25 ng/mL                 PCT < 0.1 ng/mL     Stopping of antibiotics            Stopping of antibiotics       strongly encouraged.               strongly encouraged.    ----------------------------     ------------------------------       PCT level decrease by               PCT < 0.25 ng/mL       >= 80% from peak PCT       OR PCT 0.25 - 0.5 ng/mL           Stopping of antibiotics                                             encouraged.     Stopping of antibiotics           encouraged.    ----------------------------     ------------------------------       PCT level decrease by              PCT >= 0.25 ng/mL       < 80% from peak PCT        AND PCT >= 0.5 ng/mL             Continuing antibiotics                                              encouraged.       Continuing antibiotics            encouraged.    ----------------------------     ------------------------------     PCT level increase compared          PCT > 0.5 ng/mL         with peak PCT AND          PCT >= 0.5 ng/mL             Escalation of antibiotics                                          strongly encouraged.      Escalation of antibiotics        strongly encouraged.  Protime-INR     Status: Abnormal   Collection Time: 06/24/14  1:17 AM  Result Value Ref Range   Prothrombin Time 16.8 (H) 11.6 - 15.2 seconds   INR 1.35 0.00 - 1.49  APTT     Status: None   Collection Time: 06/24/14  1:17 AM  Result Value Ref Range   aPTT 35 24 - 37 seconds  Glucose, capillary     Status: Abnormal   Collection Time: 06/24/14  4:12 AM  Result Value Ref Range   Glucose-Capillary 428 (H) 70 - 99 mg/dL  Comprehensive metabolic panel     Status: Abnormal   Collection Time: 06/24/14  4:47 AM  Result Value Ref Range   Sodium 131 (L) 135 - 145 mmol/L   Potassium 3.6 3.5 - 5.1 mmol/L   Chloride 105 96 - 112 mmol/L   CO2 21 19 - 32 mmol/L   Glucose, Bld 504 (H) 70 - 99 mg/dL   BUN 24 (H) 6 - 23 mg/dL   Creatinine, Ser 1.71 (H) 0.50 - 1.10 mg/dL   Calcium 7.5 (L) 8.4 - 10.5 mg/dL   Total Protein 7.3 6.0 - 8.3 g/dL   Albumin 2.4 (L) 3.5 - 5.2 g/dL   AST 12 0 - 37 U/L   ALT 10 0 - 35 U/L   Alkaline Phosphatase 81 39 - 117 U/L   Total Bilirubin 0.5 0.3 - 1.2 mg/dL   GFR calc non Af Amer 39 (L) >90 mL/min   GFR calc Af Amer 45 (L) >90 mL/min    Comment: (NOTE) The eGFR has been  calculated using the CKD EPI equation. This calculation has not been validated in all clinical situations. eGFR's persistently <90 mL/min signify possible Chronic Kidney Disease.    Anion gap 5 5 - 15  CBC with Differential/Platelet     Status: Abnormal   Collection Time: 06/24/14  4:47 AM  Result Value Ref Range   WBC 12.4 (H) 4.0 - 10.5 K/uL   RBC 3.40 (L) 3.87 - 5.11 MIL/uL   Hemoglobin 9.1 (L) 12.0 - 15.0 g/dL   HCT 28.9 (L) 36.0 - 46.0 %   MCV 85.0 78.0 - 100.0 fL   MCH 26.8 26.0 - 34.0 pg   MCHC 31.5 30.0 - 36.0 g/dL   RDW 14.0 11.5 - 15.5 %   Platelets 290 150 - 400 K/uL   Neutrophils Relative % 90 (H) 43 - 77 %   Neutro Abs 11.1 (H) 1.7 - 7.7 K/uL   Lymphocytes Relative 8 (L) 12 - 46 %   Lymphs Abs 1.0 0.7 - 4.0 K/uL   Monocytes Relative 2 (L) 3 - 12 %   Monocytes Absolute 0.3 0.1 - 1.0 K/uL   Eosinophils Relative 0 0 - 5 %   Eosinophils Absolute 0.0 0.0 - 0.7 K/uL   Basophils Relative 0 0 - 1 %   Basophils Absolute 0.0 0.0 - 0.1 K/uL  Lactic acid, plasma     Status: None   Collection Time: 06/24/14  4:47 AM  Result Value Ref Range   Lactic Acid, Venous 0.9 0.5 - 2.0 mmol/L  Glucose, capillary     Status: Abnormal   Collection Time: 06/24/14  5:35 AM  Result Value Ref Range   Glucose-Capillary 480 (H) 70 - 99 mg/dL  Glucose, capillary     Status: Abnormal   Collection Time: 06/24/14  7:49 AM  Result Value Ref Range   Glucose-Capillary 347 (H) 70 - 99 mg/dL  Clostridium Difficile  by PCR     Status: None   Collection Time: 06/24/14  8:43 AM  Result Value Ref Range   C difficile by pcr NEGATIVE NEGATIVE  Glucose, capillary     Status: Abnormal   Collection Time: 06/24/14  8:54 AM  Result Value Ref Range   Glucose-Capillary 365 (H) 70 - 99 mg/dL  Glucose, capillary     Status: Abnormal   Collection Time: 06/24/14 10:11 AM  Result Value Ref Range   Glucose-Capillary 326 (H) 70 - 99 mg/dL  Glucose, capillary     Status: Abnormal   Collection Time: 06/24/14 11:04  AM  Result Value Ref Range   Glucose-Capillary 318 (H) 70 - 99 mg/dL  Glucose, capillary     Status: Abnormal   Collection Time: 06/24/14 12:11 PM  Result Value Ref Range   Glucose-Capillary 281 (H) 70 - 99 mg/dL  Glucose, capillary     Status: Abnormal   Collection Time: 06/24/14  1:08 PM  Result Value Ref Range   Glucose-Capillary 204 (H) 70 - 99 mg/dL  Glucose, capillary     Status: Abnormal   Collection Time: 06/24/14  2:15 PM  Result Value Ref Range   Glucose-Capillary 266 (H) 70 - 99 mg/dL  Glucose, capillary     Status: Abnormal   Collection Time: 06/24/14  3:01 PM  Result Value Ref Range   Glucose-Capillary 250 (H) 70 - 99 mg/dL   Ct Abdomen Pelvis Wo Contrast  06/23/2014   CLINICAL DATA:  Right lower quadrant abdominal pain. Currently being treated for urinary tract infection. History of renal transplant in 2007. Initial encounter.  EXAM: CT ABDOMEN AND PELVIS WITHOUT CONTRAST  TECHNIQUE: Multidetector CT imaging of the abdomen and pelvis was performed following the standard protocol without IV contrast.  COMPARISON:  PET-CT 05/29/2008.  FINDINGS: Lower chest: Mild atelectasis at both lung bases. There is no consolidation or significant pleural effusion.  Hepatobiliary: As evaluated in the noncontrast state, the liver appears unremarkable without focal abnormality. No evidence of gallstones, gallbladder wall thickening or biliary dilatation.  Pancreas: Unremarkable. No pancreatic ductal dilatation or surrounding inflammatory changes.  Spleen: Normal in size without focal abnormality.  Adrenals/Urinary Tract: Both adrenal glands appear normal.The left native kidney is markedly atrophied. Native right kidney is not visualized. There is a transplant kidney in the right iliac fossa which appears mildly enlarged compared with the prior study. This measures 16 cm in length. There is new ill-defined low-density within the kidney as well as probable anterior perinephric fluid. Perinephric soft  tissue stranding has mildly increased compared with the baseline examination. There is mild dilatation of the transplanted right collecting system. No definite urinary tract calculus. There is mild bladder wall thickening as well as mild perivesical soft tissue stranding.  Stomach/Bowel: No evidence of bowel wall thickening, distention or surrounding inflammatory change.The appendix appears normal.  Vascular/Lymphatic: There are no enlarged abdominal or pelvic lymph nodes. No significant vascular findings are demonstrated on noncontrast imaging.  Reproductive: The uterus is surgically absent. There is no adnexal mass or other pelvic fluid collection.  Other: No evidence of abdominal wall mass or hernia.  Musculoskeletal: No acute or significant osseous findings. Stable central sclerosis within both femoral heads without typical signs of osteonecrosis or subchondral collapse.  IMPRESSION: 1. Interval enlargement of the transplanted kidney in the right iliac fossa with increased soft tissue stranding, low-density within the kidney and anterior perinephric fluid. Findings are nonspecific on this noncontrast study but suspicious for pyelonephritis. In addition,  there is mild collecting system dilatation without evidence of obstructing calculus. 2. No drainable abscess demonstrated. 3. The native left kidney is markedly atrophied. 4. The appendix appears normal.   Electronically Signed   By: Richardean Sale M.D.   On: 06/23/2014 21:48   Dg Chest 2 View  06/23/2014   CLINICAL DATA:  Dry cough for 3 days, history of hypertension and diabetes.  EXAM: CHEST  2 VIEW  COMPARISON:  Chest radiograph August 24, 2004  FINDINGS: The cardiac silhouette is upper limits of normal in size, mediastinal silhouette is nonsuspicious. No pleural effusion or focal consolidation. No pneumothorax. Soft tissue planes and included osseous structures are normal.  IMPRESSION: Borderline cardiomegaly, no acute pulmonary process.   Electronically  Signed   By: Elon Alas   On: 06/23/2014 22:56   Dg Chest Port 1 View  06/24/2014   CLINICAL DATA:  Sepsis.  EXAM: PORTABLE CHEST - 1 VIEW  COMPARISON:  06/23/2014  FINDINGS: Hypoventilation with streaky lower lung opacities consistent with atelectasis. This is confirmed on abdominal CT from late yesterday. Normal heart size and mediastinal contours for technique.  IMPRESSION: Low lung volumes with mild basilar atelectasis.   Electronically Signed   By: Monte Fantasia M.D.   On: 06/24/2014 02:02    Assessment:  1 ProbableTransplant pyelonephritis 2 Renal transplant 3 Diabetes mellitus Plan: 1 Supportive care with antibiotics IV for now 2 Cont routine immunosuppressive medication  Marquan Vokes C 06/24/2014, 4:08 PM

## 2014-06-24 NOTE — Progress Notes (Addendum)
Inpatient Diabetes Program Recommendations  AACE/ADA: New Consensus Statement on Inpatient Glycemic Control (2013)  Target Ranges:  Prepandial:   less than 140 mg/dL      Peak postprandial:   less than 180 mg/dL (1-2 hours)      Critically ill patients:  140 - 180 mg/dL   Reason for Visit: Consult regarding insulin pump  Diabetes history: Type 2 Outpatient Diabetes medications: Insulin pump- with Novolog insulin.  Pump settings below  Current orders for Inpatient glycemic control: GlucoStabilizer  Note:  Recent A1C not available.   Patient reportedly reluctant regarding insulin infusion.  Currently on insulin drip without complaints regarding it.  Visit at bedside to determine insulin pump settings.  They are as follows:  12 midnight = 1.45 units/hr  8 pm = 1.65 units/hr  Total daily insulin infusion = 35.6 units   Insulin to CHO ratio is 1:6.4  Insulin sensitivity is 24  Target is 140 mg/dl  Started on insulin pump last March.  Did not keep last appointment with pump trainer due to work conflict.   Patient thinks she changed her infusion set on Saturday.  Question if set/site change precipitated the hyperglycemia.  Has no infusion sets or supplies.  She contacted her pump trainer, Sarah at [phone], who states she will bring patient supplies by this afternoon.  Informed patient she can use hospital floor-stock Novolog.  Stressed to patient she needs to do complete change out when MD deems appropriate.   Thank you.  Valerie Croghan S. [name], RN, CNS, CDE Inpatient Diabetes Program, team pager [phone]  Addendum: Request A1C

## 2014-06-24 NOTE — Progress Notes (Signed)
29yo female has been taking Macrobid "intermittently" since 3/28 for UTI, now w/ fever, anorexia, RLQ pain, to begin IV ABX for pyelo.  Will start Rocephin 1g IV Q24H and monitor CBC, Cx.  Wynona Neat, PharmD, BCPS 06/24/2014 12:36 AM

## 2014-06-24 NOTE — Progress Notes (Signed)
[facility] notified that that pt CBG is 480 post bolus as stated by the patient. Was informed to remove insulin pump. Pt was informed that pump needed to be turned off or discontinued and she removed it Pt was also informed that labs would be drawn. Will continue to monitor. Arthor Captain LPN

## 2014-06-24 NOTE — Progress Notes (Signed)
  Echocardiogram 2D Echocardiogram has been performed.  Darlina Sicilian M 06/24/2014, 11:41 AM

## 2014-06-24 NOTE — Progress Notes (Signed)
Pts CBG was 261. She only ate an [place] ice and a bite of a sandwich. Pt gave herself an insulin bolus of 3.9.

## 2014-06-24 NOTE — Progress Notes (Signed)
Pt stated that she doesn't have supplies for her insulin pump and did not have means to get them to hospital. Arthor Captain LPN

## 2014-06-24 NOTE — Progress Notes (Signed)
CBG 195, MD ordered to place on pump. Patient placed pump at new site. Patients primary diabetic doctor increased basal rate and it was started at new settings. Patient is going to call next 2 blood sugars to primary to make sure she doesn't  need to be increased more.

## 2014-06-24 NOTE — Progress Notes (Signed)
Spoke with Mom and she states that pt MD knows about her blood sugars and she is being treated by Dr. Criss Rosales.

## 2014-06-24 NOTE — Progress Notes (Signed)
ANTIBIOTIC CONSULT NOTE - INITIAL  Pharmacy Consult for Zosyn Indication: pyelonephritis  Allergies  Allergen Reactions  . Motrin [Ibuprofen] Shortness Of Breath and Itching    Per pt  . Banana Other (See Comments)    Sick on the stomach  . Diphenhydramine Hcl     REACTION: Stopped breathing in ICU  . Iron Dextran     REACTION: vein irritation  . Shellfish Allergy Hives    Patient Measurements: Height: 5\' 6"  (167.6 cm) Weight: 166 lb 6.4 oz (75.479 kg) IBW/kg (Calculated) : 59.3   Vital Signs:   Intake/Output from previous day: 04/10 0701 - 04/11 0700 In: -  Out: 600 [Urine:600] Intake/Output from this shift: Total I/O In: 860.4 [I.V.:860.4] Out: -   Labs:  Recent Labs  06/23/14 1845 06/24/14 0117 06/24/14 0447  WBC 13.9* 13.5* 12.4*  HGB 9.8* 8.5* 9.1*  PLT 300 265 290  CREATININE 1.76* 1.88* 1.71*   Estimated Creatinine Clearance: 50 mL/min (by C-G formula based on Cr of 1.71). No results for input(s): VANCOTROUGH, VANCOPEAK, VANCORANDOM, GENTTROUGH, GENTPEAK, GENTRANDOM, TOBRATROUGH, TOBRAPEAK, TOBRARND, AMIKACINPEAK, AMIKACINTROU, AMIKACIN in the last 72 hours.   Microbiology: Recent Results (from the past 720 hour(s))  Clostridium Difficile by PCR     Status: None   Collection Time: 06/24/14  8:43 AM  Result Value Ref Range Status   C difficile by pcr NEGATIVE NEGATIVE Final    Medical History: Past Medical History  Diagnosis Date  . Chronic kidney disease   . Hypertension   . Hyperlipidemia   . Diabetes mellitus     Pt reports diagnosis in June 2011    Assessment: 63 YOF with history of kidney transplant here with UTI, now with suspected pyelonephritis. WBC elevated at 12.4, Tmax 103. SCr 1.7, est CrCl ~45-103mL/min- around baseline.  Blood and urine cultures sent, awaiting results.  Goal of Therapy:  proper dosing based on renal and hepatic function  Plan:  -Zosyn 3.375g IV q8h EI -follow c/s, renal function, clinical  progression  [name], PharmD, BCPS Clinical Pharmacist Pager: [phone] 06/24/2014 12:07 PM

## 2014-06-25 LAB — GLUCOSE, CAPILLARY
Glucose-Capillary: 152 mg/dL — ABNORMAL HIGH (ref 70–99)
Glucose-Capillary: 97 mg/dL (ref 70–99)
Glucose-Capillary: 123 mg/dL — ABNORMAL HIGH (ref 70–99)
Glucose-Capillary: 160 mg/dL — ABNORMAL HIGH (ref 70–99)

## 2014-06-25 LAB — BASIC METABOLIC PANEL
Anion gap: 11 (ref 5–15)
BUN: 23 mg/dL (ref 6–23)
CO2: 20 mmol/L (ref 19–32)
Calcium: 8 mg/dL — ABNORMAL LOW (ref 8.4–10.5)
Chloride: 111 mmol/L (ref 96–112)
Creatinine, Ser: 1.4 mg/dL — ABNORMAL HIGH (ref 0.50–1.10)
GFR calc Af Amer: 58 mL/min — ABNORMAL LOW (ref >90)
GFR calc non Af Amer: 50 mL/min — ABNORMAL LOW (ref >90)
Glucose, Bld: 128 mg/dL — ABNORMAL HIGH (ref 70–99)
POTASSIUM: 3.1 mmol/L — AB (ref 3.5–5.1)
Sodium: 142 mmol/L (ref 135–145)

## 2014-06-25 LAB — URINE CULTURE
Colony Count: >=100000
SPECIAL REQUESTS: NORMAL

## 2014-06-25 LAB — CBC
HEMATOCRIT: 26.3 % — AB (ref 36.0–46.0)
Hemoglobin: 8.5 g/dL — ABNORMAL LOW (ref 12.0–15.0)
MCH: 27.2 pg (ref 26.0–34.0)
MCHC: 32.3 g/dL (ref 30.0–36.0)
MCV: 84.3 fL (ref 78.0–100.0)
PLATELETS: 319 10*3/uL (ref 150–400)
RBC: 3.12 MIL/uL — AB (ref 3.87–5.11)
RDW: 14.2 % (ref 11.5–15.5)
WBC: 11.4 10*3/uL — ABNORMAL HIGH (ref 4.0–10.5)

## 2014-06-25 LAB — HEMOGLOBIN A1C
Hgb A1c MFr Bld: 14.2 % — ABNORMAL HIGH (ref 4.8–5.6)
Mean Plasma Glucose: 361 mg/dL

## 2014-06-25 MED ORDER — POTASSIUM CHLORIDE CRYS ER 20 MEQ PO TBCR
40.0000 meq | EXTENDED_RELEASE_TABLET | Freq: Once | ORAL | Status: AC
  Administered 2014-06-25: 40 meq via ORAL
  Filled 2014-06-25: qty 2

## 2014-06-25 MED ORDER — ACETAMINOPHEN 325 MG PO TABS
650.0000 mg | ORAL_TABLET | Freq: Four times a day (QID) | ORAL | Status: DC | PRN
Start: 2014-06-25 — End: 2014-10-01

## 2014-06-25 MED ORDER — AMOXICILLIN-POT CLAVULANATE 875-125 MG PO TABS: 1.0000 | ORAL_TABLET | Freq: Two times a day (BID) | ORAL | Status: DC

## 2014-06-25 MED ORDER — INSULIN PUMP
Freq: Three times a day (TID) | SUBCUTANEOUS | Status: DC
  Filled 2014-06-25: qty 1

## 2014-06-25 MED ORDER — RANITIDINE HCL 150 MG PO TABS: 150.0000 mg | ORAL_TABLET | Freq: Every day | ORAL | Status: DC | PRN

## 2014-06-25 NOTE — Progress Notes (Signed)
CARE MANAGEMENT NOTE 06/25/2014  Patient:  [name], [name]   Account Number:  [id number]  Date Initiated:  06/24/2014  Documentation initiated by:  [facility]  Subjective/Objective Assessment:   UTI/Pyelonephritis/sepsis     Action/Plan:   Anticipated DC Date:  06/25/2014   Anticipated DC Plan:  [place]  CM consult      Choice offered to / List presented to:             Status of service:  Completed, signed off Medicare Important Message given?  NA - LOS <3 / Initial given by admissions (If response is "NO", the following Medicare IM given date fields will be blank) Date Medicare IM given:   Medicare IM given by:   Date Additional Medicare IM given:   Additional Medicare IM given by:    Discharge Disposition:  HOME/SELF CARE  Per UR Regulation:  Reviewed for med. necessity/level of care/duration of stay  If discussed at [place] of Stay Meetings, dates discussed:    Comments:  06/24/2014 1500 Chart reviewed. Utilization review completed. Jonnie Finner RN CCM Case Mgmt phone [phone]

## 2014-06-25 NOTE — Progress Notes (Addendum)
Pt states "gave myself 5.6 units"

## 2014-06-25 NOTE — Progress Notes (Signed)
NURSING PROGRESS NOTE  [name] [id number] Discharge Data: 06/25/2014 3:24 PM Attending Provider: Kinnie Feil, MD [id number] J, MD     [name] to be D/C'd Home per MD order.  Discussed with the patient the After Visit Summary and all questions fully answered. All IV's discontinued with no bleeding noted. All belongings returned to patient for patient to take home.   Last Vital Signs:  Blood pressure 103/63, pulse 96, temperature 99.3 F (37.4 C), temperature source Oral, resp. rate 18, height 5\' 6"  (1.676 m), weight 76.8 kg (169 lb 5 oz), SpO2 98 %.  Discharge Medication List   Medication List    STOP taking these medications        lisinopril-hydrochlorothiazide 20-25 MG per tablet  Commonly known as:  PRINZIDE,ZESTORETIC     nitrofurantoin 100 MG capsule  Commonly known as:  MACRODANTIN     STOOL SOFTENER PO      TAKE these medications        acetaminophen 325 MG tablet  Commonly known as:  TYLENOL  Take 2 tablets (650 mg total) by mouth every 6 (six) hours as needed for mild pain (or Fever >/= 101).     amoxicillin-clavulanate 875-125 MG per tablet  Commonly known as:  AUGMENTIN  Take 1 tablet by mouth 2 (two) times daily.     calcium carbonate 500 MG chewable tablet  Commonly known as:  TUMS - dosed in mg elemental calcium  Chew 2 tablets by mouth 3 (three) times a week. Takes on Monday, wednesdays, and fridays     flintstones complete 60 MG chewable tablet  Chew 1 tablet by mouth daily as needed (immune system).     hydrOXYzine 25 MG tablet  Commonly known as:  ATARAX/VISTARIL  Take 25 mg by mouth 2 (two) times daily as needed for itching.     mycophenolate 360 MG Tbec EC tablet  Commonly known as:  MYFORTIC  Take 720 mg by mouth 2 (two) times daily.     NOVOLOG 100 UNIT/ML injection  Generic drug:  insulin aspart  Inject 0-18.5 Units into the skin 3 (three) times daily with meals.     POLY-IRON 150 150 MG capsule  Generic drug:  iron  polysaccharides  Take 150 mg by mouth 2 (two) times daily.     predniSONE 5 MG tablet  Commonly known as:  DELTASONE  Take 5 mg by mouth at bedtime.     ranitidine 150 MG tablet  Commonly known as:  ZANTAC  Take 1 tablet (150 mg total) by mouth daily as needed for heartburn.     simvastatin 20 MG tablet  Commonly known as:  ZOCOR  Take 20 mg by mouth every evening.     sulfamethoxazole-trimethoprim 400-80 MG per tablet  Commonly known as:  BACTRIM,SEPTRA  Take 1 tablet by mouth every other day.     tacrolimus 1 MG capsule  Commonly known as:  PROGRAF  Take 2 mg by mouth 2 (two) times daily.

## 2014-06-25 NOTE — Progress Notes (Addendum)
Pt stated she is not covering herself via insulin pump at this time, pt aware cbg is 160, pt states she is going to wait and see what she eats for dinner and for her mother to arrive.

## 2014-06-25 NOTE — Discharge Summary (Signed)
Physician Discharge Summary  [name] [id number] DOB: [birth year] DOA: 06/23/2014  PCP: Elyn Peers, MD  Admit date: 06/23/2014 Discharge date: 06/25/2014  Time spent: >35 minutes  Recommendations for Outpatient Follow-up:  F/u with PCP in 3 days F/u with nephrology in 1-2 weeks   Discharge Diagnoses:  Principal Problem:   Sepsis Active Problems:   DM (diabetes mellitus), type 2, uncontrolled   Anemia in chronic renal disease   CHRONIC KIDNEY DISEASE STAGE II (MILD)   KIDNEY TRANSPLANTATION, HX OF   Pyelonephritis   Discharge Condition: stable   Diet recommendation: low sodium   Filed Weights   06/23/14 1817 06/23/14 2338 06/25/14 0622  Weight: 71.668 kg (158 lb) 75.479 kg (166 lb 6.4 oz) 76.8 kg (169 lb 5 oz)    History of present illness:  29 y/o female with PMH of HTN, DM, h/o Transplanted kidney (2007), CKD, recently started on oral atx for uti as outpatient presented with fevers, chills  -admitted with sepsis, UTI, pyelonephritis, uncontrolled DM   Hospital Course:  1. UTI/Pyelonephritis/sepsis; CT abd: probable pyelonephritis of transplanted kidney; sepsis physiology is resolved, afebrile, blood cultures: NGTD;  -patient clinically improving on IV atx-zosyn. I have d/w patient, her mother recommended to cont IV atx inpatient due to transplanted kidney pyelonephritis. They clearly understand ongoing medical issues, related complications and she insisted to go home, would try PO atx  2. Nausea, vomiting, Diarrhea in the setting of pyelonephritis; ct abd as above;  -c diff: neg; vomiting, diarrhea have resolved  3. Chronic kidney disease CKD-II-III; h/o transplanted kidney; renal function seems stable;  -resume home regimen  4. DM with hyperglycemia; uncontrolled on admission; patient is on insulin pump at home;  -glucose control improved on short course IV insulin drip. Resumed insulin pump/setting has been adjusted per primary outpatient endocrinologist  with DM education discussions.   3. Anemia, AoCD; no s/s of acute bleeding; cont PO iron; may benefit from IV infusion with nephrology office if she agrees. But she is preferring to cont PO iron at this time     Procedures:  none (i.e. Studies not automatically included, echos, thoracentesis, etc; not x-rays)  Consultations:  Nephrology   Discharge Exam: Filed Vitals:   06/25/14 0619  BP: 130/93  Pulse: 80  Temp: 98.2 F (36.8 C)  Resp: 16    General: alert, oriented, no distress  Cardiovascular: s1,s2 rrr Respiratory: CTA BL  Discharge Instructions  Discharge Instructions    Diet - low sodium heart healthy    Complete by:  As directed      Discharge instructions    Complete by:  As directed   Please follow up with primary care doctor in 3 days Please follow up with nephrology in 1-2 weeks     Increase activity slowly    Complete by:  As directed             Medication List    STOP taking these medications        lisinopril-hydrochlorothiazide 20-25 MG per tablet  Commonly known as:  PRINZIDE,ZESTORETIC     nitrofurantoin 100 MG capsule  Commonly known as:  MACRODANTIN     STOOL SOFTENER PO      TAKE these medications        acetaminophen 325 MG tablet  Commonly known as:  TYLENOL  Take 2 tablets (650 mg total) by mouth every 6 (six) hours as needed for mild pain (or Fever >/= 101).     amoxicillin-clavulanate 875-125 MG  per tablet  Commonly known as:  AUGMENTIN  Take 1 tablet by mouth 2 (two) times daily.     calcium carbonate 500 MG chewable tablet  Commonly known as:  TUMS - dosed in mg elemental calcium  Chew 2 tablets by mouth 3 (three) times a week. Takes on Monday, wednesdays, and fridays     flintstones complete 60 MG chewable tablet  Chew 1 tablet by mouth daily as needed (immune system).     hydrOXYzine 25 MG tablet  Commonly known as:  ATARAX/VISTARIL  Take 25 mg by mouth 2 (two) times daily as needed for itching.     mycophenolate  360 MG Tbec EC tablet  Commonly known as:  MYFORTIC  Take 720 mg by mouth 2 (two) times daily.     NOVOLOG 100 UNIT/ML injection  Generic drug:  insulin aspart  Inject 0-18.5 Units into the skin 3 (three) times daily with meals.     POLY-IRON 150 150 MG capsule  Generic drug:  iron polysaccharides  Take 150 mg by mouth 2 (two) times daily.     predniSONE 5 MG tablet  Commonly known as:  DELTASONE  Take 5 mg by mouth at bedtime.     ranitidine 150 MG tablet  Commonly known as:  ZANTAC  Take 1 tablet (150 mg total) by mouth daily as needed for heartburn.     simvastatin 20 MG tablet  Commonly known as:  ZOCOR  Take 20 mg by mouth every evening.     sulfamethoxazole-trimethoprim 400-80 MG per tablet  Commonly known as:  BACTRIM,SEPTRA  Take 1 tablet by mouth every other day.     tacrolimus 1 MG capsule  Commonly known as:  PROGRAF  Take 2 mg by mouth 2 (two) times daily.       Allergies  Allergen Reactions  . Motrin [Ibuprofen] Shortness Of Breath and Itching    Per pt  . Banana Other (See Comments)    Sick on the stomach  . Diphenhydramine Hcl     REACTION: Stopped breathing in ICU  . Iron Dextran     REACTION: vein irritation  . Shellfish Allergy Hives       Follow-up Information    Follow up with Elyn Peers, MD. Schedule an appointment as soon as possible for a visit in 3 days.   Specialty:  Family Medicine   Contact information:   [address] [place] [address] [phone]       Follow up with Lucrezia Starch, MD.   Specialty:  Nephrology   Contact information:   [address] [place] [address] [phone]        The results of significant diagnostics from this hospitalization (including imaging, microbiology, ancillary and laboratory) are listed below for reference.    Significant Diagnostic Studies: Ct Abdomen Pelvis Wo Contrast  06/23/2014   CLINICAL DATA:  Right lower quadrant abdominal pain. Currently being treated for  urinary tract infection. History of renal transplant in 2007. Initial encounter.  EXAM: CT ABDOMEN AND PELVIS WITHOUT CONTRAST  TECHNIQUE: Multidetector CT imaging of the abdomen and pelvis was performed following the standard protocol without IV contrast.  COMPARISON:  PET-CT 05/29/2008.  FINDINGS: Lower chest: Mild atelectasis at both lung bases. There is no consolidation or significant pleural effusion.  Hepatobiliary: As evaluated in the noncontrast state, the liver appears unremarkable without focal abnormality. No evidence of gallstones, gallbladder wall thickening or biliary dilatation.  Pancreas: Unremarkable. No pancreatic ductal dilatation  or surrounding inflammatory changes.  Spleen: Normal in size without focal abnormality.  Adrenals/Urinary Tract: Both adrenal glands appear normal.The left native kidney is markedly atrophied. Native right kidney is not visualized. There is a transplant kidney in the right iliac fossa which appears mildly enlarged compared with the prior study. This measures 16 cm in length. There is new ill-defined low-density within the kidney as well as probable anterior perinephric fluid. Perinephric soft tissue stranding has mildly increased compared with the baseline examination. There is mild dilatation of the transplanted right collecting system. No definite urinary tract calculus. There is mild bladder wall thickening as well as mild perivesical soft tissue stranding.  Stomach/Bowel: No evidence of bowel wall thickening, distention or surrounding inflammatory change.The appendix appears normal.  Vascular/Lymphatic: There are no enlarged abdominal or pelvic lymph nodes. No significant vascular findings are demonstrated on noncontrast imaging.  Reproductive: The uterus is surgically absent. There is no adnexal mass or other pelvic fluid collection.  Other: No evidence of abdominal wall mass or hernia.  Musculoskeletal: No acute or significant osseous findings. Stable central  sclerosis within both femoral heads without typical signs of osteonecrosis or subchondral collapse.  IMPRESSION: 1. Interval enlargement of the transplanted kidney in the right iliac fossa with increased soft tissue stranding, low-density within the kidney and anterior perinephric fluid. Findings are nonspecific on this noncontrast study but suspicious for pyelonephritis. In addition, there is mild collecting system dilatation without evidence of obstructing calculus. 2. No drainable abscess demonstrated. 3. The native left kidney is markedly atrophied. 4. The appendix appears normal.   Electronically Signed   By: Richardean Sale M.D.   On: 06/23/2014 21:48   Dg Chest 2 View  06/23/2014   CLINICAL DATA:  Dry cough for 3 days, history of hypertension and diabetes.  EXAM: CHEST  2 VIEW  COMPARISON:  Chest radiograph August 24, 2004  FINDINGS: The cardiac silhouette is upper limits of normal in size, mediastinal silhouette is nonsuspicious. No pleural effusion or focal consolidation. No pneumothorax. Soft tissue planes and included osseous structures are normal.  IMPRESSION: Borderline cardiomegaly, no acute pulmonary process.   Electronically Signed   By: Elon Alas   On: 06/23/2014 22:56   Dg Chest Port 1 View  06/24/2014   CLINICAL DATA:  Sepsis.  EXAM: PORTABLE CHEST - 1 VIEW  COMPARISON:  06/23/2014  FINDINGS: Hypoventilation with streaky lower lung opacities consistent with atelectasis. This is confirmed on abdominal CT from late yesterday. Normal heart size and mediastinal contours for technique.  IMPRESSION: Low lung volumes with mild basilar atelectasis.   Electronically Signed   By: Monte Fantasia M.D.   On: 06/24/2014 02:02    Microbiology: Recent Results (from the past 240 hour(s))  Urine culture     Status: None   Collection Time: 06/23/14  6:58 PM  Result Value Ref Range Status   Specimen Description URINE, CLEAN CATCH  Final   Special Requests Normal  Final   Colony Count   Final     >=100,000 COLONIES/ML Performed at [facility]    Culture   Final    Multiple bacterial morphotypes present, none predominant. Suggest appropriate recollection if clinically indicated. Performed at [facility]    Report Status 06/25/2014 FINAL  Final  Culture, blood (routine x 2)     Status: None (Preliminary result)   Collection Time: 06/23/14  7:00 PM  Result Value Ref Range Status   Specimen Description BLOOD RIGHT ANTECUBITAL  Final  Special Requests BOTTLES DRAWN AEROBIC AND ANAEROBIC 5 CC  Final   Culture   Final           BLOOD CULTURE RECEIVED NO GROWTH TO DATE CULTURE WILL BE HELD FOR 5 DAYS BEFORE ISSUING A FINAL NEGATIVE REPORT Performed at [facility]    Report Status PENDING  Incomplete  Culture, blood (routine x 2)     Status: None (Preliminary result)   Collection Time: 06/23/14  7:07 PM  Result Value Ref Range Status   Specimen Description BLOOD BLOOD RIGHT FOREARM  Final   Special Requests BOTTLES DRAWN AEROBIC AND ANAEROBIC 3 CC  Final   Culture   Final           BLOOD CULTURE RECEIVED NO GROWTH TO DATE CULTURE WILL BE HELD FOR 5 DAYS BEFORE ISSUING A FINAL NEGATIVE REPORT Performed at [facility]    Report Status PENDING  Incomplete  Clostridium Difficile by PCR     Status: None   Collection Time: 06/24/14  8:43 AM  Result Value Ref Range Status   C difficile by pcr NEGATIVE NEGATIVE Final     Labs: Basic Metabolic Panel:  Recent Labs Lab 06/23/14 1845 06/24/14 0117 06/24/14 0447 06/25/14 0845  NA 130*  --  131* 142  K 3.5  --  3.6 3.1*  CL 99  --  105 111  CO2 23  --  21 20  GLUCOSE 316*  --  504* 128*  BUN 25*  --  24* 23  CREATININE 1.76* 1.88* 1.71* 1.40*  CALCIUM 8.4  --  7.5* 8.0*   Liver Function Tests:  Recent Labs Lab 06/23/14 1845 06/24/14 0447  AST 14 12  ALT 10 10  ALKPHOS 89 81  BILITOT 0.7 0.5  PROT 8.0 7.3  ALBUMIN 2.9* 2.4*    Recent Labs Lab 06/23/14 1840  LIPASE 21   No  results for input(s): AMMONIA in the last 168 hours. CBC:  Recent Labs Lab 06/23/14 1845 06/24/14 0117 06/24/14 0447 06/25/14 0845  WBC 13.9* 13.5* 12.4* 11.4*  NEUTROABS 10.6*  --  11.1*  --   HGB 9.8* 8.5* 9.1* 8.5*  HCT 30.2* 27.4* 28.9* 26.3*  MCV 84.4 86.2 85.0 84.3  PLT 300 265 290 319   Cardiac Enzymes: No results for input(s): CKTOTAL, CKMB, CKMBINDEX, TROPONINI in the last 168 hours. BNP: BNP (last 3 results) No results for input(s): BNP in the last 8760 hours.  ProBNP (last 3 results) No results for input(s): PROBNP in the last 8760 hours.  CBG:  Recent Labs Lab 06/24/14 1619 06/24/14 1743 06/24/14 2123 06/25/14 0812 06/25/14 1217  GLUCAP 195* 206* 261* 152* 97       Signed:  Jacayla Nordell N  [facility] 06/25/2014, 2:13 PM

## 2014-06-25 NOTE — Progress Notes (Signed)
Pt cbg 152 pt speaking with her diabetes coordinator for bolus and coverage for breakfast.

## 2014-06-25 NOTE — Progress Notes (Signed)
Pt states she did not bolus herself or cover her lunch due to feeling "like my sugar is low" RN checked cbg for pt, cbg 123 pt aware.

## 2014-06-25 NOTE — Progress Notes (Signed)
Assessment:  1 ProbableTransplant pyelonephritis 2 Renal transplant 3 Diabetes mellitus Plan: 1 Supportive care with antibiotics as outpt for ten more days( she insists upon going home) e.g. Augmentin.  Instructed that if cannot tolerate PO meds will need IV. 2 Cont routine immunosuppressive medication 3 We will arrange f/u with Dr. Lorrene Reid  Subjective: Interval History: Wants to go home  Objective: Vital signs in last 24 hours: Temp:  [98 F (36.7 C)-98.7 F (37.1 C)] 98.2 F (36.8 C) (04/12 0619) Pulse Rate:  [80-86] 80 (04/12 0619) Resp:  [16-20] 16 (04/12 0619) BP: (119-136)/(84-95) 130/93 mmHg (04/12 0619) SpO2:  [100 %] 100 % (04/12 0619) Weight:  [76.8 kg (169 lb 5 oz)] 76.8 kg (169 lb 5 oz) (04/12 0622) Weight change: 5.132 kg (11 lb 5 oz)  Intake/Output from previous day: 04/11 0701 - 04/12 0700 In: 1930.4 [P.O.:1020; I.V.:860.4; IV Piggyback:50] Out: 1550 [Urine:1550] Intake/Output this shift: Total I/O In: 480 [P.O.:480] Out: 1000 [Urine:1000]  General appearance: alert and cooperative GI: renal allograsft is tender but less  Lab Results:  Recent Labs  06/24/14 0447 06/25/14 0845  WBC 12.4* 11.4*  HGB 9.1* 8.5*  HCT 28.9* 26.3*  PLT 290 319   BMET:  Recent Labs  06/24/14 0447 06/25/14 0845  NA 131* 142  K 3.6 3.1*  CL 105 111  CO2 21 20  GLUCOSE 504* 128*  BUN 24* 23  CREATININE 1.71* 1.40*  CALCIUM 7.5* 8.0*   No results for input(s): PTH in the last 72 hours. Iron Studies: No results for input(s): IRON, TIBC, TRANSFERRIN, FERRITIN in the last 72 hours. Studies/Results: Ct Abdomen Pelvis Wo Contrast  06/23/2014   CLINICAL DATA:  Right lower quadrant abdominal pain. Currently being treated for urinary tract infection. History of renal transplant in 2007. Initial encounter.  EXAM: CT ABDOMEN AND PELVIS WITHOUT CONTRAST  TECHNIQUE: Multidetector CT imaging of the abdomen and pelvis was performed following the standard protocol without IV  contrast.  COMPARISON:  PET-CT 05/29/2008.  FINDINGS: Lower chest: Mild atelectasis at both lung bases. There is no consolidation or significant pleural effusion.  Hepatobiliary: As evaluated in the noncontrast state, the liver appears unremarkable without focal abnormality. No evidence of gallstones, gallbladder wall thickening or biliary dilatation.  Pancreas: Unremarkable. No pancreatic ductal dilatation or surrounding inflammatory changes.  Spleen: Normal in size without focal abnormality.  Adrenals/Urinary Tract: Both adrenal glands appear normal.The left native kidney is markedly atrophied. Native right kidney is not visualized. There is a transplant kidney in the right iliac fossa which appears mildly enlarged compared with the prior study. This measures 16 cm in length. There is new ill-defined low-density within the kidney as well as probable anterior perinephric fluid. Perinephric soft tissue stranding has mildly increased compared with the baseline examination. There is mild dilatation of the transplanted right collecting system. No definite urinary tract calculus. There is mild bladder wall thickening as well as mild perivesical soft tissue stranding.  Stomach/Bowel: No evidence of bowel wall thickening, distention or surrounding inflammatory change.The appendix appears normal.  Vascular/Lymphatic: There are no enlarged abdominal or pelvic lymph nodes. No significant vascular findings are demonstrated on noncontrast imaging.  Reproductive: The uterus is surgically absent. There is no adnexal mass or other pelvic fluid collection.  Other: No evidence of abdominal wall mass or hernia.  Musculoskeletal: No acute or significant osseous findings. Stable central sclerosis within both femoral heads without typical signs of osteonecrosis or subchondral collapse.  IMPRESSION: 1. Interval enlargement of the transplanted kidney  in the right iliac fossa with increased soft tissue stranding, low-density within the  kidney and anterior perinephric fluid. Findings are nonspecific on this noncontrast study but suspicious for pyelonephritis. In addition, there is mild collecting system dilatation without evidence of obstructing calculus. 2. No drainable abscess demonstrated. 3. The native left kidney is markedly atrophied. 4. The appendix appears normal.   Electronically Signed   By: Richardean Sale M.D.   On: 06/23/2014 21:48   Dg Chest 2 View  06/23/2014   CLINICAL DATA:  Dry cough for 3 days, history of hypertension and diabetes.  EXAM: CHEST  2 VIEW  COMPARISON:  Chest radiograph August 24, 2004  FINDINGS: The cardiac silhouette is upper limits of normal in size, mediastinal silhouette is nonsuspicious. No pleural effusion or focal consolidation. No pneumothorax. Soft tissue planes and included osseous structures are normal.  IMPRESSION: Borderline cardiomegaly, no acute pulmonary process.   Electronically Signed   By: Elon Alas   On: 06/23/2014 22:56   Dg Chest Port 1 View  06/24/2014   CLINICAL DATA:  Sepsis.  EXAM: PORTABLE CHEST - 1 VIEW  COMPARISON:  06/23/2014  FINDINGS: Hypoventilation with streaky lower lung opacities consistent with atelectasis. This is confirmed on abdominal CT from late yesterday. Normal heart size and mediastinal contours for technique.  IMPRESSION: Low lung volumes with mild basilar atelectasis.   Electronically Signed   By: Monte Fantasia M.D.   On: 06/24/2014 02:02    Scheduled: . calcium carbonate  2 tablet Oral Daily  . enoxaparin (LOVENOX) injection  40 mg Subcutaneous Q24H  . famotidine  20 mg Oral BID  . insulin pump   Subcutaneous TID AC, HS, 0200  . mycophenolate  720 mg Oral BID  . piperacillin-tazobactam (ZOSYN)  IV  3.375 g Intravenous 3 times per day  . predniSONE  5 mg Oral QHS  . simvastatin  20 mg Oral QPM  . tacrolimus  2 mg Oral BID      LOS: 1 day   Ronica Vivian C 06/25/2014,11:48 AM

## 2014-06-25 NOTE — Progress Notes (Signed)
   Inpatient Diabetes Program Recommendations  AACE/ADA: New Consensus Statement on Inpatient Glycemic Control (2013)  Target Ranges:  Prepandial:   less than 140 mg/dL      Peak postprandial:   less than 180 mg/dL (1-2 hours)      Critically ill patients:  140 - 180 mg/dL   Reason for Assessment:  Results for TAMBERLYN, WALTERS (MRN [id number]) as of 06/25/2014 12:54  Ref. Range 06/25/2014 08:12 06/25/2014 12:17  Glucose-Capillary Latest Range: 70-99 mg/dL 152 (H) 97    Diabetes history: Type 2 per H&P Outpatient Diabetes medications: Insulin pump Current orders for Inpatient glycemic control: Patient is currently back on insulin pump.  She has spoken to her Insulin pump trainer and MD regarding insulin pump settings.  CBG's well controlled today.     Spoke briefly with patient.  She states that pump trainer, Judson Roch came in yesterday and increased basal rates.  Current basal rates are:  12 a-2.25 units/hr                              8 p- 2.5 units/hr                             Total basal=55 units/24 hours  Blood sugars are better today.  Patient states that she did not cover lunch b/c her CBG's are lower than normal.  Will follow.  Thanks, Adah Perl, RN, BC-ADM Inpatient Diabetes Coordinator Pager [phone] (8a-5p)

## 2014-06-25 NOTE — Clinical Documentation Improvement (Signed)
Presents with Sepsis, probable transplant pyelonephritis (Renal Consult 06/24/14).   Patient with abnormal Creatinines: 1.76 on admit -> 1.88, 1.71 on 4/11 and 1.40 this morning  This is a 0.36 drop in Creatinine over a 48 hour timeframe  IV fluid hydration initiated  Please provide a diagnosis associated with the above clinical indicators and treatment provided and document findings in next progress note and include in discharge summary if applicable.  Acute Renal Failure/Acute Kidney Injury Acute on Chronic Renal Failure Chronic Renal Failure Other Condition Cannot Clinically Determine   Thank You, Zoila Shutter ,RN Clinical Documentation Specialist:  [facility] [employer]

## 2014-06-27 ENCOUNTER — Encounter (HOSPITAL_COMMUNITY)
Admission: RE | Admit: 2014-06-27 | Discharge: 2014-06-27 | Disposition: A | Discharge status: Home / Self Care | Payer: Medicare Other | Source: Ambulatory Visit | Attending: Nephrology | Admitting: Nephrology

## 2014-06-27 DIAGNOSIS — D631 Anemia in chronic kidney disease: Secondary | ICD-10-CM | POA: Insufficient documentation

## 2014-06-27 DIAGNOSIS — N183 Chronic kidney disease, stage 3 (moderate): Secondary | ICD-10-CM | POA: Insufficient documentation

## 2014-06-27 LAB — POCT HEMOGLOBIN-HEMACUE: Hemoglobin: 8.7 g/dL — ABNORMAL LOW (ref 12.0–15.0)

## 2014-06-27 MED ORDER — EPOETIN ALFA 40000 UNIT/ML IJ SOLN: 22500.0000 [IU] | INTRAMUSCULAR | Status: DC

## 2014-06-27 MED ORDER — EPOETIN ALFA 3000 UNIT/ML IJ SOLN
INTRAMUSCULAR | Status: AC
  Administered 2014-06-27: 2250 [IU]
  Filled 2014-06-27: qty 1

## 2014-06-27 MED ORDER — EPOETIN ALFA 20000 UNIT/ML IJ SOLN
INTRAMUSCULAR | Status: AC
  Administered 2014-06-27: 20000 [IU]
  Filled 2014-06-27: qty 1

## 2014-06-28 LAB — IRON AND TIBC
Iron: 14 ug/dL — ABNORMAL LOW (ref 42–145)
SATURATION RATIOS: 8 % — AB (ref 20–55)
TIBC: 171 ug/dL — AB (ref 250–470)
UIBC: 157 ug/dL (ref 125–400)

## 2014-06-28 LAB — FERRITIN: Ferritin: 2851 ng/mL — ABNORMAL HIGH (ref 10–291)

## 2014-06-30 LAB — CULTURE, BLOOD (ROUTINE X 2)
CULTURE: NO GROWTH
CULTURE: NO GROWTH

## 2014-07-08 DIAGNOSIS — Z940 Kidney transplant status: Secondary | ICD-10-CM | POA: Diagnosis not present

## 2014-07-10 ENCOUNTER — Encounter (HOSPITAL_COMMUNITY)
Admission: RE | Admit: 2014-07-10 | Discharge: 2014-07-10 | Disposition: A | Discharge status: Home / Self Care | Payer: Medicare Other | Source: Ambulatory Visit | Attending: Nephrology | Admitting: Nephrology

## 2014-07-10 DIAGNOSIS — N183 Chronic kidney disease, stage 3 (moderate): Secondary | ICD-10-CM | POA: Diagnosis not present

## 2014-07-10 DIAGNOSIS — D631 Anemia in chronic kidney disease: Secondary | ICD-10-CM | POA: Diagnosis not present

## 2014-07-10 LAB — POCT HEMOGLOBIN-HEMACUE: Hemoglobin: 8.5 g/dL — ABNORMAL LOW (ref 12.0–15.0)

## 2014-07-10 MED ORDER — EPOETIN ALFA 20000 UNIT/ML IJ SOLN
INTRAMUSCULAR | Status: AC
  Administered 2014-07-10: 20000 [IU]
  Filled 2014-07-10: qty 1

## 2014-07-10 MED ORDER — EPOETIN ALFA 3000 UNIT/ML IJ SOLN
INTRAMUSCULAR | Status: AC
  Administered 2014-07-10: 3000 [IU]
  Filled 2014-07-10: qty 1

## 2014-07-10 MED ORDER — EPOETIN ALFA 40000 UNIT/ML IJ SOLN: 22500.0000 [IU] | INTRAMUSCULAR | Status: DC

## 2014-07-11 DIAGNOSIS — I10 Essential (primary) hypertension: Secondary | ICD-10-CM | POA: Diagnosis not present

## 2014-07-11 DIAGNOSIS — D649 Anemia, unspecified: Secondary | ICD-10-CM | POA: Diagnosis not present

## 2014-07-11 DIAGNOSIS — Z940 Kidney transplant status: Secondary | ICD-10-CM | POA: Diagnosis not present

## 2014-07-11 DIAGNOSIS — N390 Urinary tract infection, site not specified: Secondary | ICD-10-CM | POA: Diagnosis not present

## 2014-07-11 DIAGNOSIS — E139 Other specified diabetes mellitus without complications: Secondary | ICD-10-CM | POA: Diagnosis not present

## 2014-07-18 DIAGNOSIS — N390 Urinary tract infection, site not specified: Secondary | ICD-10-CM | POA: Diagnosis not present

## 2014-07-23 ENCOUNTER — Other Ambulatory Visit (HOSPITAL_COMMUNITY): Payer: Self-pay | Admitting: *Deleted

## 2014-07-24 ENCOUNTER — Encounter (HOSPITAL_COMMUNITY)
Admission: RE | Admit: 2014-07-24 | Discharge: 2014-07-24 | Disposition: A | Discharge status: Home / Self Care | Payer: Medicare Other | Source: Ambulatory Visit | Attending: Nephrology | Admitting: Nephrology

## 2014-07-24 DIAGNOSIS — N183 Chronic kidney disease, stage 3 (moderate): Secondary | ICD-10-CM | POA: Insufficient documentation

## 2014-07-24 DIAGNOSIS — D631 Anemia in chronic kidney disease: Secondary | ICD-10-CM | POA: Diagnosis not present

## 2014-07-24 LAB — IRON AND TIBC
IRON: 17 ug/dL — AB (ref 28–170)
Saturation Ratios: 8 % — ABNORMAL LOW (ref 10.4–31.8)
TIBC: 209 ug/dL — ABNORMAL LOW (ref 250–450)
UIBC: 192 ug/dL

## 2014-07-24 LAB — POCT HEMOGLOBIN-HEMACUE: Hemoglobin: 7.7 g/dL — ABNORMAL LOW (ref 12.0–15.0)

## 2014-07-24 LAB — FERRITIN: FERRITIN: 1182 ng/mL — AB (ref 11–307)

## 2014-07-24 MED ORDER — EPOETIN ALFA 10000 UNIT/ML IJ SOLN
INTRAMUSCULAR | Status: AC
  Administered 2014-07-24: 10000 [IU] via SUBCUTANEOUS
  Filled 2014-07-24: qty 1

## 2014-07-24 MED ORDER — SODIUM CHLORIDE 0.9 % IV SOLN
510.0000 mg | INTRAVENOUS | Status: DC
  Administered 2014-07-24: 510 mg via INTRAVENOUS
  Filled 2014-07-24: qty 17

## 2014-07-24 MED ORDER — EPOETIN ALFA 20000 UNIT/ML IJ SOLN
INTRAMUSCULAR | Status: AC
  Administered 2014-07-24: 20000 [IU] via SUBCUTANEOUS
  Filled 2014-07-24: qty 1

## 2014-07-24 MED ORDER — EPOETIN ALFA 20000 UNIT/ML IJ SOLN: 30000.0000 [IU] | Freq: Once | INTRAMUSCULAR | Status: DC

## 2014-07-24 MED ORDER — EPOETIN ALFA 40000 UNIT/ML IJ SOLN: 22500.0000 [IU] | INTRAMUSCULAR | Status: DC

## 2014-07-24 NOTE — Discharge Instructions (Signed)

## 2014-07-24 NOTE — Progress Notes (Signed)
hgb 7.7 today. Patient denies SOB, tarry stools, coldness. Reports she has had chest pain. Points to clavicular area on left when asked to id area where pain was. Reported to [facility] at [facility]. Dr. Lorrene Reid increased procrit to 30,000 units every 2 weeks.

## 2014-08-01 ENCOUNTER — Encounter (HOSPITAL_COMMUNITY)
Admission: RE | Admit: 2014-08-01 | Discharge: 2014-08-01 | Disposition: A | Discharge status: Home / Self Care | Payer: Medicare Other | Source: Ambulatory Visit | Attending: Nephrology | Admitting: Nephrology

## 2014-08-01 DIAGNOSIS — N183 Chronic kidney disease, stage 3 (moderate): Secondary | ICD-10-CM | POA: Diagnosis not present

## 2014-08-01 DIAGNOSIS — D631 Anemia in chronic kidney disease: Secondary | ICD-10-CM | POA: Diagnosis not present

## 2014-08-01 MED ORDER — SODIUM CHLORIDE 0.9 % IV SOLN
510.0000 mg | INTRAVENOUS | Status: AC
  Administered 2014-08-01: 510 mg via INTRAVENOUS
  Filled 2014-08-01: qty 17

## 2014-08-06 ENCOUNTER — Other Ambulatory Visit (HOSPITAL_COMMUNITY)
Admission: RE | Admit: 2014-08-06 | Discharge: 2014-08-06 | Disposition: A | Discharge status: Home / Self Care | Payer: Medicare Other | Source: Ambulatory Visit | Attending: Family Medicine | Admitting: Family Medicine

## 2014-08-06 ENCOUNTER — Other Ambulatory Visit: Payer: Medicare Other | Admitting: Family Medicine

## 2014-08-06 DIAGNOSIS — E1169 Type 2 diabetes mellitus with other specified complication: Secondary | ICD-10-CM | POA: Diagnosis not present

## 2014-08-06 DIAGNOSIS — N760 Acute vaginitis: Secondary | ICD-10-CM | POA: Insufficient documentation

## 2014-08-06 DIAGNOSIS — I10 Essential (primary) hypertension: Secondary | ICD-10-CM | POA: Diagnosis not present

## 2014-08-06 DIAGNOSIS — Z113 Encounter for screening for infections with a predominantly sexual mode of transmission: Secondary | ICD-10-CM | POA: Diagnosis not present

## 2014-08-06 DIAGNOSIS — N136 Pyonephrosis: Secondary | ICD-10-CM | POA: Diagnosis not present

## 2014-08-07 ENCOUNTER — Other Ambulatory Visit (HOSPITAL_COMMUNITY): Payer: Self-pay | Admitting: *Deleted

## 2014-08-07 ENCOUNTER — Encounter (HOSPITAL_COMMUNITY): Payer: Medicare Other

## 2014-08-08 ENCOUNTER — Encounter (HOSPITAL_COMMUNITY)
Admission: RE | Admit: 2014-08-08 | Discharge: 2014-08-08 | Disposition: A | Discharge status: Home / Self Care | Payer: Medicare Other | Source: Ambulatory Visit | Attending: Nephrology | Admitting: Nephrology

## 2014-08-08 DIAGNOSIS — N183 Chronic kidney disease, stage 3 (moderate): Secondary | ICD-10-CM | POA: Diagnosis not present

## 2014-08-08 DIAGNOSIS — D631 Anemia in chronic kidney disease: Secondary | ICD-10-CM | POA: Diagnosis not present

## 2014-08-08 LAB — URINE CYTOLOGY ANCILLARY ONLY
BACTERIAL VAGINITIS: NEGATIVE
CHLAMYDIA, DNA PROBE: NEGATIVE
Candida vaginitis: NEGATIVE
Neisseria Gonorrhea: NEGATIVE
Trichomonas: NEGATIVE

## 2014-08-08 LAB — POCT HEMOGLOBIN-HEMACUE: HEMOGLOBIN: 8.6 g/dL — AB (ref 12.0–15.0)

## 2014-08-08 MED ORDER — EPOETIN ALFA 40000 UNIT/ML IJ SOLN: 30000.0000 [IU] | INTRAMUSCULAR | Status: DC

## 2014-08-08 MED ORDER — EPOETIN ALFA 10000 UNIT/ML IJ SOLN
INTRAMUSCULAR | Status: AC
  Administered 2014-08-08: 10000 [IU] via SUBCUTANEOUS
  Filled 2014-08-08: qty 1

## 2014-08-08 MED ORDER — EPOETIN ALFA 20000 UNIT/ML IJ SOLN
INTRAMUSCULAR | Status: AC
  Administered 2014-08-08: 20000 [IU] via SUBCUTANEOUS
  Filled 2014-08-08: qty 1

## 2014-08-22 ENCOUNTER — Encounter (HOSPITAL_COMMUNITY)
Admission: RE | Admit: 2014-08-22 | Discharge: 2014-08-22 | Disposition: A | Discharge status: Home / Self Care | Payer: Medicare Other | Source: Ambulatory Visit | Attending: Nephrology | Admitting: Nephrology

## 2014-08-22 DIAGNOSIS — N183 Chronic kidney disease, stage 3 (moderate): Secondary | ICD-10-CM | POA: Insufficient documentation

## 2014-08-22 DIAGNOSIS — D631 Anemia in chronic kidney disease: Secondary | ICD-10-CM | POA: Diagnosis not present

## 2014-08-22 LAB — FERRITIN: Ferritin: 1399 ng/mL — ABNORMAL HIGH (ref 11–307)

## 2014-08-22 LAB — IRON AND TIBC
IRON: 62 ug/dL (ref 28–170)
Saturation Ratios: 24 % (ref 10.4–31.8)
TIBC: 253 ug/dL (ref 250–450)
UIBC: 191 ug/dL

## 2014-08-22 MED ORDER — EPOETIN ALFA 40000 UNIT/ML IJ SOLN: 30000.0000 [IU] | INTRAMUSCULAR | Status: DC

## 2014-08-22 MED ORDER — EPOETIN ALFA 10000 UNIT/ML IJ SOLN
INTRAMUSCULAR | Status: AC
  Administered 2014-08-22: 10000 [IU]
  Filled 2014-08-22: qty 1

## 2014-08-22 MED ORDER — EPOETIN ALFA 20000 UNIT/ML IJ SOLN
INTRAMUSCULAR | Status: AC
  Administered 2014-08-22: 20000 [IU]
  Filled 2014-08-22: qty 1

## 2014-08-23 LAB — POCT HEMOGLOBIN-HEMACUE: HEMOGLOBIN: 10.1 g/dL — AB (ref 12.0–15.0)

## 2014-08-29 ENCOUNTER — Other Ambulatory Visit (HOSPITAL_COMMUNITY): Payer: Self-pay | Admitting: *Deleted

## 2014-08-30 ENCOUNTER — Encounter (HOSPITAL_COMMUNITY)
Admission: RE | Admit: 2014-08-30 | Discharge: 2014-08-30 | Disposition: A | Discharge status: Home / Self Care | Payer: Medicare Other | Source: Ambulatory Visit | Attending: Nephrology | Admitting: Nephrology

## 2014-08-30 DIAGNOSIS — N183 Chronic kidney disease, stage 3 (moderate): Secondary | ICD-10-CM | POA: Diagnosis not present

## 2014-08-30 DIAGNOSIS — D631 Anemia in chronic kidney disease: Secondary | ICD-10-CM | POA: Diagnosis not present

## 2014-08-30 MED ORDER — SODIUM CHLORIDE 0.9 % IV SOLN
510.0000 mg | INTRAVENOUS | Status: AC
  Administered 2014-08-30: 510 mg via INTRAVENOUS
  Filled 2014-08-30: qty 17

## 2014-08-30 MED ORDER — EPOETIN ALFA 40000 UNIT/ML IJ SOLN: 30000.0000 [IU] | INTRAMUSCULAR | Status: DC

## 2014-09-03 DIAGNOSIS — N2581 Secondary hyperparathyroidism of renal origin: Secondary | ICD-10-CM | POA: Diagnosis not present

## 2014-09-03 DIAGNOSIS — E785 Hyperlipidemia, unspecified: Secondary | ICD-10-CM | POA: Diagnosis not present

## 2014-09-03 DIAGNOSIS — Z940 Kidney transplant status: Secondary | ICD-10-CM | POA: Diagnosis not present

## 2014-09-03 DIAGNOSIS — N390 Urinary tract infection, site not specified: Secondary | ICD-10-CM | POA: Diagnosis not present

## 2014-09-03 DIAGNOSIS — D649 Anemia, unspecified: Secondary | ICD-10-CM | POA: Diagnosis not present

## 2014-09-05 ENCOUNTER — Encounter (HOSPITAL_COMMUNITY)
Admission: RE | Admit: 2014-09-05 | Discharge: 2014-09-05 | Disposition: A | Discharge status: Home / Self Care | Payer: Medicare Other | Source: Ambulatory Visit | Attending: Nephrology | Admitting: Nephrology

## 2014-09-05 ENCOUNTER — Encounter (HOSPITAL_COMMUNITY): Payer: Medicare Other

## 2014-09-05 DIAGNOSIS — N183 Chronic kidney disease, stage 3 (moderate): Secondary | ICD-10-CM | POA: Diagnosis not present

## 2014-09-05 DIAGNOSIS — D631 Anemia in chronic kidney disease: Secondary | ICD-10-CM | POA: Diagnosis not present

## 2014-09-05 LAB — POCT HEMOGLOBIN-HEMACUE: Hemoglobin: 10 g/dL — ABNORMAL LOW (ref 12.0–15.0)

## 2014-09-05 MED ORDER — EPOETIN ALFA 40000 UNIT/ML IJ SOLN: 30000.0000 [IU] | INTRAMUSCULAR | Status: DC

## 2014-09-05 MED ORDER — EPOETIN ALFA 20000 UNIT/ML IJ SOLN
INTRAMUSCULAR | Status: AC
  Administered 2014-09-05: 20000 [IU] via SUBCUTANEOUS
  Filled 2014-09-05: qty 1

## 2014-09-05 MED ORDER — EPOETIN ALFA 10000 UNIT/ML IJ SOLN
INTRAMUSCULAR | Status: AC
  Administered 2014-09-05: 10000 [IU] via SUBCUTANEOUS
  Filled 2014-09-05: qty 1

## 2014-09-12 ENCOUNTER — Encounter (HOSPITAL_COMMUNITY): Payer: Medicare Other

## 2014-09-19 ENCOUNTER — Encounter (HOSPITAL_COMMUNITY)
Admission: RE | Admit: 2014-09-19 | Discharge: 2014-09-19 | Disposition: A | Discharge status: Home / Self Care | Payer: Medicare Other | Source: Ambulatory Visit | Attending: Nephrology | Admitting: Nephrology

## 2014-09-19 DIAGNOSIS — N390 Urinary tract infection, site not specified: Secondary | ICD-10-CM | POA: Diagnosis not present

## 2014-09-19 DIAGNOSIS — D631 Anemia in chronic kidney disease: Secondary | ICD-10-CM | POA: Insufficient documentation

## 2014-09-19 DIAGNOSIS — N183 Chronic kidney disease, stage 3 (moderate): Secondary | ICD-10-CM | POA: Insufficient documentation

## 2014-09-19 LAB — IRON AND TIBC
IRON: 71 ug/dL (ref 28–170)
SATURATION RATIOS: 30 % (ref 10.4–31.8)
TIBC: 235 ug/dL — AB (ref 250–450)
UIBC: 164 ug/dL

## 2014-09-19 LAB — FERRITIN: FERRITIN: 1630 ng/mL — AB (ref 11–307)

## 2014-09-19 LAB — POCT HEMOGLOBIN-HEMACUE: Hemoglobin: 10.6 g/dL — ABNORMAL LOW (ref 12.0–15.0)

## 2014-09-19 MED ORDER — EPOETIN ALFA 10000 UNIT/ML IJ SOLN
INTRAMUSCULAR | Status: AC
  Administered 2014-09-19: 10000 [IU] via SUBCUTANEOUS
  Filled 2014-09-19: qty 1

## 2014-09-19 MED ORDER — EPOETIN ALFA 20000 UNIT/ML IJ SOLN
INTRAMUSCULAR | Status: AC
  Administered 2014-09-19: 20000 [IU] via SUBCUTANEOUS
  Filled 2014-09-19: qty 1

## 2014-09-19 MED ORDER — EPOETIN ALFA 40000 UNIT/ML IJ SOLN: 30000.0000 [IU] | INTRAMUSCULAR | Status: DC

## 2014-09-26 DIAGNOSIS — Z940 Kidney transplant status: Secondary | ICD-10-CM | POA: Diagnosis not present

## 2014-09-30 ENCOUNTER — Inpatient Hospital Stay (HOSPITAL_COMMUNITY)
Admission: EM | Admit: 2014-09-30 | Discharge: 2014-10-01 | DRG: 683 | Disposition: A | Discharge status: Home / Self Care | Payer: Medicare Other | Attending: Internal Medicine | Admitting: Internal Medicine

## 2014-09-30 ENCOUNTER — Emergency Department (HOSPITAL_COMMUNITY): Payer: Medicare Other

## 2014-09-30 ENCOUNTER — Encounter (HOSPITAL_COMMUNITY): Payer: Self-pay | Admitting: *Deleted

## 2014-09-30 DIAGNOSIS — R05 Cough: Secondary | ICD-10-CM | POA: Diagnosis not present

## 2014-09-30 DIAGNOSIS — Z940 Kidney transplant status: Secondary | ICD-10-CM | POA: Diagnosis not present

## 2014-09-30 DIAGNOSIS — R000 Tachycardia, unspecified: Secondary | ICD-10-CM | POA: Diagnosis not present

## 2014-09-30 DIAGNOSIS — N182 Chronic kidney disease, stage 2 (mild): Secondary | ICD-10-CM | POA: Diagnosis not present

## 2014-09-30 DIAGNOSIS — E860 Dehydration: Secondary | ICD-10-CM | POA: Diagnosis not present

## 2014-09-30 DIAGNOSIS — Z794 Long term (current) use of insulin: Secondary | ICD-10-CM | POA: Diagnosis not present

## 2014-09-30 DIAGNOSIS — IMO0002 Reserved for concepts with insufficient information to code with codable children: Secondary | ICD-10-CM

## 2014-09-30 DIAGNOSIS — Z91013 Allergy to seafood: Secondary | ICD-10-CM

## 2014-09-30 DIAGNOSIS — E785 Hyperlipidemia, unspecified: Secondary | ICD-10-CM | POA: Diagnosis present

## 2014-09-30 DIAGNOSIS — A419 Sepsis, unspecified organism: Secondary | ICD-10-CM

## 2014-09-30 DIAGNOSIS — E875 Hyperkalemia: Secondary | ICD-10-CM | POA: Diagnosis not present

## 2014-09-30 DIAGNOSIS — F445 Conversion disorder with seizures or convulsions: Secondary | ICD-10-CM

## 2014-09-30 DIAGNOSIS — E28319 Asymptomatic premature menopause: Secondary | ICD-10-CM

## 2014-09-30 DIAGNOSIS — N289 Disorder of kidney and ureter, unspecified: Secondary | ICD-10-CM

## 2014-09-30 DIAGNOSIS — R569 Unspecified convulsions: Secondary | ICD-10-CM

## 2014-09-30 DIAGNOSIS — D638 Anemia in other chronic diseases classified elsewhere: Secondary | ICD-10-CM | POA: Diagnosis present

## 2014-09-30 DIAGNOSIS — N189 Chronic kidney disease, unspecified: Secondary | ICD-10-CM | POA: Diagnosis present

## 2014-09-30 DIAGNOSIS — N179 Acute kidney failure, unspecified: Secondary | ICD-10-CM | POA: Diagnosis not present

## 2014-09-30 DIAGNOSIS — Z91018 Allergy to other foods: Secondary | ICD-10-CM | POA: Diagnosis not present

## 2014-09-30 DIAGNOSIS — E1165 Type 2 diabetes mellitus with hyperglycemia: Secondary | ICD-10-CM | POA: Diagnosis not present

## 2014-09-30 DIAGNOSIS — G4723 Circadian rhythm sleep disorder, irregular sleep wake type: Secondary | ICD-10-CM

## 2014-09-30 DIAGNOSIS — I129 Hypertensive chronic kidney disease with stage 1 through stage 4 chronic kidney disease, or unspecified chronic kidney disease: Secondary | ICD-10-CM | POA: Diagnosis present

## 2014-09-30 DIAGNOSIS — Z9641 Presence of insulin pump (external) (internal): Secondary | ICD-10-CM | POA: Diagnosis present

## 2014-09-30 DIAGNOSIS — Z888 Allergy status to other drugs, medicaments and biological substances status: Secondary | ICD-10-CM

## 2014-09-30 DIAGNOSIS — N12 Tubulo-interstitial nephritis, not specified as acute or chronic: Secondary | ICD-10-CM

## 2014-09-30 DIAGNOSIS — R42 Dizziness and giddiness: Secondary | ICD-10-CM | POA: Diagnosis not present

## 2014-09-30 DIAGNOSIS — E1129 Type 2 diabetes mellitus with other diabetic kidney complication: Secondary | ICD-10-CM | POA: Diagnosis not present

## 2014-09-30 DIAGNOSIS — D631 Anemia in chronic kidney disease: Secondary | ICD-10-CM

## 2014-09-30 DIAGNOSIS — I10 Essential (primary) hypertension: Secondary | ICD-10-CM

## 2014-09-30 DIAGNOSIS — N186 End stage renal disease: Secondary | ICD-10-CM | POA: Diagnosis present

## 2014-09-30 DIAGNOSIS — R079 Chest pain, unspecified: Secondary | ICD-10-CM

## 2014-09-30 DIAGNOSIS — E1110 Type 2 diabetes mellitus with ketoacidosis without coma: Secondary | ICD-10-CM

## 2014-09-30 LAB — CBC WITH DIFFERENTIAL/PLATELET
BASOS PCT: 0 % (ref 0–1)
Basophils Absolute: 0 10*3/uL (ref 0.0–0.1)
EOS ABS: 0.2 10*3/uL (ref 0.0–0.7)
Eosinophils Relative: 2 % (ref 0–5)
HCT: 28.9 % — ABNORMAL LOW (ref 36.0–46.0)
HEMOGLOBIN: 9.9 g/dL — AB (ref 12.0–15.0)
LYMPHS ABS: 1.9 10*3/uL (ref 0.7–4.0)
LYMPHS PCT: 21 % (ref 12–46)
MCH: 28.1 pg (ref 26.0–34.0)
MCHC: 34.3 g/dL (ref 30.0–36.0)
MCV: 82.1 fL (ref 78.0–100.0)
Monocytes Absolute: 0.6 10*3/uL (ref 0.1–1.0)
Monocytes Relative: 7 % (ref 3–12)
Neutro Abs: 6.3 10*3/uL (ref 1.7–7.7)
Neutrophils Relative %: 70 % (ref 43–77)
Platelets: 354 10*3/uL (ref 150–400)
RBC: 3.52 MIL/uL — AB (ref 3.87–5.11)
RDW: 14.4 % (ref 11.5–15.5)
WBC: 9 10*3/uL (ref 4.0–10.5)

## 2014-09-30 LAB — COMPREHENSIVE METABOLIC PANEL
ALBUMIN: 2.9 g/dL — AB (ref 3.5–5.0)
ALK PHOS: 77 U/L (ref 38–126)
ALT: 13 U/L — AB (ref 14–54)
AST: 11 U/L — ABNORMAL LOW (ref 15–41)
Anion gap: 15 (ref 5–15)
BILIRUBIN TOTAL: 0.5 mg/dL (ref 0.3–1.2)
BUN: 73 mg/dL — ABNORMAL HIGH (ref 6–20)
CALCIUM: 8.7 mg/dL — AB (ref 8.9–10.3)
CO2: 20 mmol/L — ABNORMAL LOW (ref 22–32)
Chloride: 92 mmol/L — ABNORMAL LOW (ref 101–111)
Creatinine, Ser: 3.12 mg/dL — ABNORMAL HIGH (ref 0.44–1.00)
GFR calc non Af Amer: 19 mL/min — ABNORMAL LOW (ref >60)
GFR, EST AFRICAN AMERICAN: 22 mL/min — AB (ref >60)
Glucose, Bld: 335 mg/dL — ABNORMAL HIGH (ref 65–99)
POTASSIUM: 3.5 mmol/L (ref 3.5–5.1)
SODIUM: 127 mmol/L — AB (ref 135–145)
TOTAL PROTEIN: 8 g/dL (ref 6.5–8.1)

## 2014-09-30 LAB — BASIC METABOLIC PANEL
ANION GAP: 13 (ref 5–15)
BUN: 63 mg/dL — AB (ref 6–20)
CO2: 18 mmol/L — ABNORMAL LOW (ref 22–32)
Calcium: 8.2 mg/dL — ABNORMAL LOW (ref 8.9–10.3)
Chloride: 100 mmol/L — ABNORMAL LOW (ref 101–111)
Creatinine, Ser: 2.59 mg/dL — ABNORMAL HIGH (ref 0.44–1.00)
GFR calc non Af Amer: 23 mL/min — ABNORMAL LOW (ref >60)
GFR, EST AFRICAN AMERICAN: 27 mL/min — AB (ref >60)
GLUCOSE: 410 mg/dL — AB (ref 65–99)
Potassium: 3.7 mmol/L (ref 3.5–5.1)
SODIUM: 131 mmol/L — AB (ref 135–145)

## 2014-09-30 LAB — URINALYSIS, ROUTINE W REFLEX MICROSCOPIC
Bilirubin Urine: NEGATIVE
Glucose, UA: NEGATIVE mg/dL
Ketones, ur: NEGATIVE mg/dL
Nitrite: NEGATIVE
PH: 5 (ref 5.0–8.0)
Protein, ur: NEGATIVE mg/dL
Specific Gravity, Urine: 1.006 (ref 1.005–1.030)
UROBILINOGEN UA: 0.2 mg/dL (ref 0.0–1.0)

## 2014-09-30 LAB — GLUCOSE, CAPILLARY
Glucose-Capillary: 463 mg/dL — ABNORMAL HIGH (ref 65–99)
Glucose-Capillary: 374 mg/dL — ABNORMAL HIGH (ref 65–99)
Glucose-Capillary: 212 mg/dL — ABNORMAL HIGH (ref 65–99)

## 2014-09-30 LAB — URINE MICROSCOPIC-ADD ON

## 2014-09-30 LAB — CBG MONITORING, ED: Glucose-Capillary: 426 mg/dL — ABNORMAL HIGH (ref 65–99)

## 2014-09-30 LAB — I-STAT CG4 LACTIC ACID, ED: Lactic Acid, Venous: 0.62 mmol/L (ref 0.5–2.0)

## 2014-09-30 MED ORDER — ALUM & MAG HYDROXIDE-SIMETH 200-200-20 MG/5ML PO SUSP: 30.0000 mL | Freq: Four times a day (QID) | ORAL | Status: DC | PRN

## 2014-09-30 MED ORDER — POTASSIUM CHLORIDE IN NACL 20-0.9 MEQ/L-% IV SOLN
INTRAVENOUS | Status: DC
  Administered 2014-09-30 – 2014-10-01 (×3): via INTRAVENOUS
  Filled 2014-09-30 (×5): qty 1000

## 2014-09-30 MED ORDER — ACETAMINOPHEN 325 MG PO TABS: 650.0000 mg | ORAL_TABLET | Freq: Four times a day (QID) | ORAL | Status: DC | PRN

## 2014-09-30 MED ORDER — SIMVASTATIN 20 MG PO TABS
20.0000 mg | ORAL_TABLET | Freq: Every evening | ORAL | Status: DC
  Administered 2014-09-30 – 2014-10-01 (×2): 20 mg via ORAL
  Filled 2014-09-30 (×2): qty 1

## 2014-09-30 MED ORDER — ACETAMINOPHEN 650 MG RE SUPP: 650.0000 mg | Freq: Four times a day (QID) | RECTAL | Status: DC | PRN

## 2014-09-30 MED ORDER — SODIUM CHLORIDE 0.9 % IV BOLUS (SEPSIS)
1000.0000 mL | Freq: Once | INTRAVENOUS | Status: AC
  Administered 2014-09-30: 1000 mL via INTRAVENOUS

## 2014-09-30 MED ORDER — POLYETHYLENE GLYCOL 3350 17 G PO PACK
17.0000 g | PACK | Freq: Every day | ORAL | Status: DC | PRN
Start: 2014-09-30 — End: 2014-10-01

## 2014-09-30 MED ORDER — INSULIN ASPART 100 UNIT/ML ~~LOC~~ SOLN
10.0000 [IU] | Freq: Once | SUBCUTANEOUS | Status: DC
  Filled 2014-09-30: qty 1

## 2014-09-30 MED ORDER — SULFAMETHOXAZOLE-TRIMETHOPRIM 400-80 MG PO TABS
1.0000 | ORAL_TABLET | ORAL | Status: DC
  Administered 2014-09-30: 1 via ORAL
  Filled 2014-09-30: qty 1

## 2014-09-30 MED ORDER — HYDROXYZINE HCL 25 MG PO TABS: 25.0000 mg | ORAL_TABLET | Freq: Two times a day (BID) | ORAL | Status: DC | PRN

## 2014-09-30 MED ORDER — FAMOTIDINE 20 MG PO TABS
20.0000 mg | ORAL_TABLET | Freq: Every day | ORAL | Status: DC
  Administered 2014-09-30 – 2014-10-01 (×2): 20 mg via ORAL
  Filled 2014-09-30 (×3): qty 1

## 2014-09-30 MED ORDER — CALCIUM CARBONATE ANTACID 500 MG PO CHEW
2.0000 | CHEWABLE_TABLET | ORAL | Status: DC
  Administered 2014-09-30: 400 mg via ORAL
  Filled 2014-09-30 (×2): qty 2

## 2014-09-30 MED ORDER — PREDNISONE 5 MG PO TABS
5.0000 mg | ORAL_TABLET | Freq: Every day | ORAL | Status: DC
  Administered 2014-09-30: 5 mg via ORAL
  Filled 2014-09-30 (×2): qty 1

## 2014-09-30 MED ORDER — ONDANSETRON HCL 4 MG/2ML IJ SOLN: 4.0000 mg | Freq: Four times a day (QID) | INTRAMUSCULAR | Status: DC | PRN

## 2014-09-30 MED ORDER — MYCOPHENOLATE SODIUM 180 MG PO TBEC
720.0000 mg | DELAYED_RELEASE_TABLET | Freq: Two times a day (BID) | ORAL | Status: DC
  Administered 2014-09-30 – 2014-10-01 (×2): 720 mg via ORAL
  Filled 2014-09-30 (×4): qty 4

## 2014-09-30 MED ORDER — INSULIN ASPART 100 UNIT/ML ~~LOC~~ SOLN
0.0000 [IU] | Freq: Every day | SUBCUTANEOUS | Status: DC
  Administered 2014-09-30: 2 [IU] via SUBCUTANEOUS

## 2014-09-30 MED ORDER — POLYSACCHARIDE IRON COMPLEX 150 MG PO CAPS: 150.0000 mg | ORAL_CAPSULE | Freq: Two times a day (BID) | ORAL | Status: DC | PRN

## 2014-09-30 MED ORDER — LEVOFLOXACIN 500 MG PO TABS
500.0000 mg | ORAL_TABLET | Freq: Every day | ORAL | Status: DC
  Administered 2014-09-30: 500 mg via ORAL
  Filled 2014-09-30: qty 1

## 2014-09-30 MED ORDER — INSULIN ASPART 100 UNIT/ML ~~LOC~~ SOLN
0.0000 [IU] | Freq: Three times a day (TID) | SUBCUTANEOUS | Status: DC
  Administered 2014-09-30: 15 [IU] via SUBCUTANEOUS
  Administered 2014-10-01: 11 [IU] via SUBCUTANEOUS
  Administered 2014-10-01: 15 [IU] via SUBCUTANEOUS
  Administered 2014-10-01: 2 [IU] via SUBCUTANEOUS

## 2014-09-30 MED ORDER — ONDANSETRON HCL 4 MG PO TABS: 4.0000 mg | ORAL_TABLET | Freq: Four times a day (QID) | ORAL | Status: DC | PRN

## 2014-09-30 MED ORDER — TACROLIMUS 1 MG PO CAPS
2.0000 mg | ORAL_CAPSULE | Freq: Two times a day (BID) | ORAL | Status: DC
  Administered 2014-09-30 – 2014-10-01 (×2): 2 mg via ORAL
  Filled 2014-09-30 (×4): qty 2

## 2014-09-30 NOTE — Progress Notes (Signed)
Report received from [place], [place].  Will await for pt. To arrive to [address].  Alphonzo Lemmings, RN

## 2014-09-30 NOTE — ED Notes (Signed)
Stinson MD paged to be made aware of pt CBG

## 2014-09-30 NOTE — Consult Note (Signed)
Reason for Consult: Acute on Chronic Kidney Disease Referring Physician: Dr. 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 Patient was discharged from [facility] on 06/25/14 after being treated for sepsis secondary to pyelonephritis of transplanted kidney and has been on antibiotics intermittently since April. She was recently on ciprofloxacin for one week, but due to nausea and vomiting, she was switched to Levaquin 500 mg daily on 09/24/14.   Patient states for the past several days she has been feeling dizzy, lightheaded and dehydrated. She has been trying to drink adequate fluids, but has been very busy with the recent passing of her mother several days ago. Patient denies any associated symptoms of headache, fever, chills, chest pain, shortness of breath, abdominal pain, dysuria or syncope. She does admit to N/V 2 days ago. Her blood sugars have been elevated persistently and she admits this contributes to her dehydration. She's on Novolog via insulin pump sliding scale.  Admits not knowing basal rates, adjustments for meals or counting carbs.  Cr 2.5 on the 14th.  Kleb in urine 7/7.Marland Kitchen Primary Nephrologist is Dr. Lorrene Reid.   Past Medical History  Diagnosis Date  . Chronic kidney disease   . Hypertension   . Hyperlipidemia   . Diabetes mellitus     Pt reports diagnosis in June 2011    Past Surgical History  Procedure Laterality Date  . Kidney transplant  2007  . Thyroid lobectomy    . Renal biopsy Bilateral 2003    Family History  Problem Relation Age of Onset  . Arthritis Mother   . Diabetes Mother   . Hypertension Mother    Social History:  reports that she has never smoked. She has never used smokeless tobacco. She reports that she does not drink alcohol or use illicit  drugs.  Allergies:  Allergies  Allergen Reactions  . Motrin [Ibuprofen] Shortness Of Breath and Itching    Per pt  . Banana Other (See Comments)    Sick on the stomach  . Diphenhydramine Hcl     REACTION: Stopped breathing in ICU  . Iron Dextran     REACTION: vein irritation  . Shellfish Allergy Hives    Medications:  I have reviewed the patient's current medications. Prior to Admission:  Prescriptions prior to admission  Medication Sig Dispense Refill Last Dose  . acetaminophen (TYLENOL) 500 MG tablet Take 1,000 mg by mouth every 6 (six) hours as needed for mild pain.   Past Month at Unknown time  . calcium carbonate (TUMS) 500 MG chewable tablet Chew 2 tablets by mouth 3 (three) times a week. Takes on Monday, wednesdays, and fridays   Past Week at Unknown time  . hydrOXYzine (ATARAX) 25 MG tablet Take 25 mg by mouth 2 (two) times daily as needed for itching.    Past Week at Unknown time  . levofloxacin (LEVAQUIN) 500 MG tablet Take 500 mg by mouth daily.  0 09/29/2014 at Unknown time  . mycophenolate (MYFORTIC) 360 MG TBEC Take 720 mg by mouth 2 (two) times daily.    09/29/2014 at Unknown time  . NOVOLOG 100 UNIT/ML injection Inject 0-18.5 Units into the skin 3 (three) times daily with meals.   5 09/29/2014 at Unknown time  . POLY-IRON 150 150 MG capsule Take 150 mg by  mouth 2 (two) times daily as needed (supplement).   5 Past Week at Unknown time  . predniSONE (DELTASONE) 5 MG tablet Take 5 mg by mouth at bedtime.    09/29/2014 at Unknown time  . ranitidine (ZANTAC) 150 MG tablet Take 1 tablet (150 mg total) by mouth daily as needed for heartburn. 60 tablet 0 Past Week at Unknown time  . simvastatin (ZOCOR) 20 MG tablet Take 20 mg by mouth every evening.   09/29/2014 at Unknown time  . sulfamethoxazole-trimethoprim (BACTRIM,SEPTRA) 400-80 MG per tablet Take 1 tablet by mouth every other day.    Past Week at Unknown time  . tacrolimus (PROGRAF) 1 MG capsule Take 2 mg by mouth 2 (two)  times daily.    09/29/2014 at Unknown time  . acetaminophen (TYLENOL) 325 MG tablet Take 2 tablets (650 mg total) by mouth every 6 (six) hours as needed for mild pain (or Fever >/= 101). (Patient not taking: Reported on 09/30/2014)   Not Taking at Unknown time  . amoxicillin-clavulanate (AUGMENTIN) 875-125 MG per tablet Take 1 tablet by mouth 2 (two) times daily. (Patient not taking: Reported on 09/30/2014) 20 tablet 0 Completed Course at Unknown time   Scheduled: . calcium carbonate  2 tablet Oral Once per day on Mon Wed Fri  . famotidine  20 mg Oral Daily  . insulin aspart  0-15 Units Subcutaneous TID WC  . insulin aspart  0-5 Units Subcutaneous QHS  . insulin aspart  10 Units Subcutaneous Once  . mycophenolate  720 mg Oral BID  . predniSONE  5 mg Oral QHS  . simvastatin  20 mg Oral QPM  . tacrolimus  2 mg Oral BID   Results for orders placed or performed during the hospital encounter of 09/30/14 (from the past 48 hour(s))  Comprehensive metabolic panel     Status: Abnormal   Collection Time: 09/30/14  7:15 AM  Result Value Ref Range   Sodium 127 (L) 135 - 145 mmol/L   Potassium 3.5 3.5 - 5.1 mmol/L   Chloride 92 (L) 101 - 111 mmol/L   CO2 20 (L) 22 - 32 mmol/L   Glucose, Bld 335 (H) 65 - 99 mg/dL   BUN 73 (H) 6 - 20 mg/dL   Creatinine, Ser 3.12 (H) 0.44 - 1.00 mg/dL   Calcium 8.7 (L) 8.9 - 10.3 mg/dL   Total Protein 8.0 6.5 - 8.1 g/dL   Albumin 2.9 (L) 3.5 - 5.0 g/dL   AST 11 (L) 15 - 41 U/L   ALT 13 (L) 14 - 54 U/L   Alkaline Phosphatase 77 38 - 126 U/L   Total Bilirubin 0.5 0.3 - 1.2 mg/dL   GFR calc non Af Amer 19 (L) >60 mL/min   GFR calc Af Amer 22 (L) >60 mL/min    Comment: (NOTE) The eGFR has been calculated using the CKD EPI equation. This calculation has not been validated in all clinical situations. eGFR's persistently <60 mL/min signify possible Chronic Kidney Disease.    Anion gap 15 5 - 15  CBC with Differential     Status: Abnormal   Collection Time: 09/30/14   7:15 AM  Result Value Ref Range   WBC 9.0 4.0 - 10.5 K/uL   RBC 3.52 (L) 3.87 - 5.11 MIL/uL   Hemoglobin 9.9 (L) 12.0 - 15.0 g/dL   HCT 28.9 (L) 36.0 - 46.0 %   MCV 82.1 78.0 - 100.0 fL   MCH 28.1 26.0 - 34.0 pg  MCHC 34.3 30.0 - 36.0 g/dL   RDW 14.4 11.5 - 15.5 %   Platelets 354 150 - 400 K/uL   Neutrophils Relative % 70 43 - 77 %   Neutro Abs 6.3 1.7 - 7.7 K/uL   Lymphocytes Relative 21 12 - 46 %   Lymphs Abs 1.9 0.7 - 4.0 K/uL   Monocytes Relative 7 3 - 12 %   Monocytes Absolute 0.6 0.1 - 1.0 K/uL   Eosinophils Relative 2 0 - 5 %   Eosinophils Absolute 0.2 0.0 - 0.7 K/uL   Basophils Relative 0 0 - 1 %   Basophils Absolute 0.0 0.0 - 0.1 K/uL  I-Stat CG4 Lactic Acid, ED     Status: None   Collection Time: 09/30/14  7:31 AM  Result Value Ref Range   Lactic Acid, Venous 0.62 0.5 - 2.0 mmol/L  Urinalysis, Routine w reflex microscopic (not at [facility])     Status: Abnormal   Collection Time: 09/30/14  9:25 AM  Result Value Ref Range   Color, Urine YELLOW YELLOW   APPearance CLOUDY (A) CLEAR   Specific Gravity, Urine 1.006 1.005 - 1.030   pH 5.0 5.0 - 8.0   Glucose, UA NEGATIVE NEGATIVE mg/dL   Hgb urine dipstick TRACE (A) NEGATIVE   Bilirubin Urine NEGATIVE NEGATIVE   Ketones, ur NEGATIVE NEGATIVE mg/dL   Protein, ur NEGATIVE NEGATIVE mg/dL   Urobilinogen, UA 0.2 0.0 - 1.0 mg/dL   Nitrite NEGATIVE NEGATIVE   Leukocytes, UA SMALL (A) NEGATIVE  Urine microscopic-add on     Status: None   Collection Time: 09/30/14  9:25 AM  Result Value Ref Range   Squamous Epithelial / LPF RARE RARE   WBC, UA 3-6 <3 WBC/hpf   RBC / HPF 0-2 <3 RBC/hpf   Bacteria, UA RARE RARE  CBG monitoring, ED     Status: Abnormal   Collection Time: 09/30/14 12:20 PM  Result Value Ref Range   Glucose-Capillary 426 (H) 65 - 99 mg/dL  Glucose, capillary     Status: Abnormal   Collection Time: 09/30/14  2:23 PM  Result Value Ref Range   Glucose-Capillary 463 (H) 65 - 99 mg/dL  Basic metabolic panel      Status: Abnormal   Collection Time: 09/30/14  3:00 PM  Result Value Ref Range   Sodium 131 (L) 135 - 145 mmol/L   Potassium 3.7 3.5 - 5.1 mmol/L   Chloride 100 (L) 101 - 111 mmol/L   CO2 18 (L) 22 - 32 mmol/L   Glucose, Bld 410 (H) 65 - 99 mg/dL   BUN 63 (H) 6 - 20 mg/dL   Creatinine, Ser 2.59 (H) 0.44 - 1.00 mg/dL   Calcium 8.2 (L) 8.9 - 10.3 mg/dL   GFR calc non Af Amer 23 (L) >60 mL/min   GFR calc Af Amer 27 (L) >60 mL/min    Comment: (NOTE) The eGFR has been calculated using the CKD EPI equation. This calculation has not been validated in all clinical situations. eGFR's persistently <60 mL/min signify possible Chronic Kidney Disease.    Anion gap 13 5 - 15    Dg Chest 2 View  09/30/2014   CLINICAL DATA:  Hypotension and dizziness; cough  EXAM: CHEST  2 VIEW  COMPARISON:  June 24, 2014  FINDINGS: There is no edema or consolidation. The heart size and pulmonary vascularity are normal. No adenopathy. No bone lesions.  IMPRESSION: No edema or consolidation.   Electronically Signed   By: Gwyndolyn Saxon  Jasmine December III M.D.   On: 09/30/2014 07:39   ROS  General: Denies fever, chills, fatigue Respiratory: Denies SOB, cough  Cardiovascular: Denies chest pain and palpitations.  Gastrointestinal: Admits to nausea, vomiting (now resolved). Denies abdominal pain, diarrhea Genitourinary: Denies dysuria, urgency, suprapubic pain and flank pain. Neurological: Admits to dizziness. Denies headaches, weakness, lightheadedness and syncope.  Blood pressure 105/73, pulse 92, temperature 98 F (36.7 C), temperature source Oral, resp. rate 20, SpO2 100 %. Physical Exam  Filed Vitals:   09/30/14 1200 09/30/14 1222 09/30/14 1230 09/30/14 1328  BP: 120/83 120/83 111/83 105/73  Pulse: 87 85 95 92  Temp:  98.3 F (36.8 C)  98 F (36.7 C)  TempSrc:    Oral  Resp:  16  20  SpO2: 99% 100% 100% 100%   General: Vital signs reviewed.  Patient is well-developed and well-nourished, in no acute distress and  cooperative with exam.  Cardiovascular: RRR, S1 normal, S2 normal, no murmurs, gallops, or rubs. Pulmonary/Chest: Clear to auscultation bilaterally, no wheezes, rales, or rhonchi. Abdominal: Soft, non-tender, non-distended, BS + Extremities: No lower extremity edema bilaterally, pulses symmetric and intact bilaterally.  Skin: Warm, dry and intact. No rashes or erythema. Psychiatric: Normal mood and affect. speech and behavior is normal. Cognition and memory are normal.   Assessment/Plan:  Acute on Chronic Kidney Disease: In the setting of renal transplant, likely secondary to dehydration from hyperglycemia diuresis. Creatinine on admission 3.12. It appears baseline is 1.5-1.8 as of April 2016, but recent office visit July 14 showed creatinine of 2.5. CXR negative for edema or consolidation. UA with small leukocytes, negative nitrites, 3-6 WBC. Lactic acid 0.62. Patient is feeling better after 2 L NS boluses.  -Continue IVF: NS with KCl 20 mEq/L at 125 mL/hr -Trend renal function panels today at 4 pm and tomorrow am -If not improved, consider transfer to transplant hospital [facility] in [place], [place] for biopsy Feel this is glu induced diuresis.  And vol depletion.  May have ATN, check Prograf  History of FSGS and ESRD s/p Renal Transplant: Transplant in 2007 at [facility]. 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 Biopsy at that time showed moderate to severe interstitial fibrosis. Patient is currently on prednisone 5 mg daily, Mycophenolate 720 mg BID, Tacrolimus 2 mg BID, Bactrim 400-80 mg every other day, and TUMS 1000 mg three times weekly.  -Continue prednisone 5 mg daily -Continue Mycophenolate 720 mg BID -Continue Tacrolimus 2 mg BID -Check tacrolimus trough level prior to tacrolimus administration -STOP Bactrim 400-80 mg every other day blocks Cr secretion -Continue TUMS 1000 mg three times weekly -Monitor  kidney function closely with low threshold to transfer to transplant facility  History of Pyelonephritis: Discharged in April 2016 after treatment of pyelonephritis of transplanted kidney. Intermittently on abx. Currently on Levaquin 500 mg daily (start date 09/24/14 for 5 days, but given 10 pills?). -Discontinue Levaquin 500 mg daily -Urine culture  T2DM: Hyperglycemic on admission. HgbA1c 14.2 on 06/24/14. Patient is on Novolog insulin pump at home.  -Per primary -Diabetic education  Anemia of Chronic Disease: Hgb 9.9 on admission, which is about at baseline. Recent iron studies on 7/716 show iron of 71, low TIBC of 235, normal saturation ratio, and high ferritin of 1630.  -Monitor  DVT/PE ppx: SCDs  Osa Craver, DO PGY-2 Internal Medicine Resident Pager # [phone] 09/30/2014 4:16 PM I have seen and examined this patient and agree with the  plan of care seen, eval, examined, discussed with resident and patient. Feel this is vol related due to ^^glu and recent UTI.  Needs extensive DM education to avoid again.  Marland Kitchen  , L 09/30/2014, 4:31 PM

## 2014-09-30 NOTE — ED Notes (Signed)
Nurse unavailable to take report at this time.  Will call back.

## 2014-09-30 NOTE — ED Notes (Signed)
RN went into room to administer insulin.  Pt reported that she entered her CBG into her insulin pump and already administered it herself without staff knowledge.

## 2014-09-30 NOTE — Progress Notes (Signed)
Utilization review completed.  

## 2014-09-30 NOTE — ED Notes (Signed)
Pt. Woke up this morning felling dizzy. She thought she may be dehydrated. Pt. States her blood pressure might be high. Pt. BP 100/70. Pt is also a diabetic and thought that her sugar might be high as well.

## 2014-09-30 NOTE — ED Provider Notes (Signed)
CSN: [id number]     Arrival date & time 09/30/14  [id number] History   First MD Initiated Contact with Patient 09/30/14 0701     Chief Complaint  Patient presents with  . Dizziness     (Consider location/radiation/quality/duration/timing/severity/associated sxs/prior Treatment) Patient is a 29 y.o. female presenting with dizziness. The history is provided by the patient.  Dizziness Associated symptoms: no chest pain, no diarrhea, no headaches, no nausea, no shortness of breath, no vomiting and no weakness    patient presents with dizziness/lightheadedness. Began this morning but states she feels dehydrated. She's had a previous renal transplantcomplicated by pyelonephritis. That was around 3 months ago and she is still on antibodies for it. She's been managed by Dr. Lorrene Reid states that she had been on Cipro for the last week but had nausea and vomiting and then was switched over to Levaquin. States she's been doing better since then. No fevers. Has she states her dysuria is cleared up. Since she's also been told her sugars have been high. Denies fevers. No weighs chest pain but has had a cough.  Past Medical History  Diagnosis Date  . Chronic kidney disease   . Hypertension   . Hyperlipidemia   . Diabetes mellitus     Pt reports diagnosis in June 2011   Past Surgical History  Procedure Laterality Date  . Kidney transplant  2007  . Thyroid lobectomy    . Renal biopsy Bilateral 2003   Family History  Problem Relation Age of Onset  . Arthritis Mother   . Diabetes Mother   . Hypertension Mother    History  Substance Use Topics  . Smoking status: Never Smoker   . Smokeless tobacco: Never Used  . Alcohol Use: No   OB History    No data available     Review of Systems  Constitutional: Positive for appetite change and fatigue. Negative for fever and activity change.  Eyes: Negative for pain.  Respiratory: Negative for chest tightness and shortness of breath.   Cardiovascular:  Negative for chest pain and leg swelling.  Gastrointestinal: Negative for nausea, vomiting, abdominal pain and diarrhea.  Genitourinary: Negative for flank pain.  Musculoskeletal: Negative for back pain and neck stiffness.  Skin: Negative for rash.  Neurological: Positive for dizziness and light-headedness. Negative for weakness, numbness and headaches.  Psychiatric/Behavioral: Negative for behavioral problems.      Allergies  Motrin; Banana; Diphenhydramine hcl; Iron dextran; and Shellfish allergy  Home Medications   Prior to Admission medications   Medication Sig Start Date End Date Taking? Authorizing Provider  acetaminophen (TYLENOL) 500 MG tablet Take 1,000 mg by mouth every 6 (six) hours as needed for mild pain.   Yes Historical Provider, MD  calcium carbonate (TUMS) 500 MG chewable tablet Chew 2 tablets by mouth 3 (three) times a week. Takes on Monday, wednesdays, and fridays   Yes Historical Provider, MD  hydrOXYzine (ATARAX) 25 MG tablet Take 25 mg by mouth 2 (two) times daily as needed for itching.    Yes Historical Provider, MD  mycophenolate (MYFORTIC) 360 MG TBEC Take 720 mg by mouth 2 (two) times daily.    Yes Historical Provider, MD  NOVOLOG 100 UNIT/ML injection Inject 0-18.5 Units into the skin 3 (three) times daily with meals.  06/16/14  Yes Historical Provider, MD  POLY-IRON 150 150 MG capsule Take 150 mg by mouth 2 (two) times daily as needed (supplement).  03/20/14  Yes Historical Provider, MD  predniSONE (DELTASONE) 5 MG tablet  Take 5 mg by mouth at bedtime.    Yes Historical Provider, MD  ranitidine (ZANTAC) 150 MG tablet Take 1 tablet (150 mg total) by mouth daily as needed for heartburn. 06/25/14  Yes Kinnie Feil, MD  simvastatin (ZOCOR) 20 MG tablet Take 20 mg by mouth every evening.   Yes Historical Provider, MD  tacrolimus (PROGRAF) 1 MG capsule Take 2 mg by mouth 2 (two) times daily.    Yes Historical Provider, MD  promethazine (PHENERGAN) 12.5 MG tablet Take  1 tablet (12.5 mg total) by mouth every 6 (six) hours as needed for nausea or vomiting. 10/01/14   Ripudeep Krystal Eaton, MD   BP 112/87 mmHg  Pulse 99  Temp(Src) 97.3 F (36.3 C) (Oral)  Resp 18  Ht 5\' 6"  (1.676 m)  Wt 170 lb (77.111 kg)  BMI 27.45 kg/m2  SpO2 100% Physical Exam  Constitutional: She is oriented to person, place, and time. She appears well-developed and well-nourished.  HENT:  Head: Normocephalic and atraumatic.  Eyes: Pupils are equal, round, and reactive to light.  Neck: Normal range of motion. Neck supple.  Cardiovascular: Normal rate, regular rhythm and normal heart sounds.   No murmur heard. Pulmonary/Chest: Effort normal and breath sounds normal. No respiratory distress. She has no wheezes. She has no rales.  Abdominal: Soft. She exhibits no distension. There is tenderness. There is no rebound and no guarding.  Mild suprapubic tenderness without rebound or guarding.  Musculoskeletal: Normal range of motion.  Neurological: She is alert and oriented to person, place, and time. No cranial nerve deficit.  Skin: Skin is warm and dry.  Psychiatric: Her speech is normal.  Nursing note and vitals reviewed.   ED Course  Procedures (including critical care time) Labs Review Labs Reviewed  COMPREHENSIVE METABOLIC PANEL - Abnormal; Notable for the following:    Sodium 127 (*)    Chloride 92 (*)    CO2 20 (*)    Glucose, Bld 335 (*)    BUN 73 (*)    Creatinine, Ser 3.12 (*)    Calcium 8.7 (*)    Albumin 2.9 (*)    AST 11 (*)    ALT 13 (*)    GFR calc non Af Amer 19 (*)    GFR calc Af Amer 22 (*)    All other components within normal limits  URINALYSIS, ROUTINE W REFLEX MICROSCOPIC (NOT AT [facility]) - Abnormal; Notable for the following:    APPearance CLOUDY (*)    Hgb urine dipstick TRACE (*)    Leukocytes, UA SMALL (*)    All other components within normal limits  CBC WITH DIFFERENTIAL/PLATELET - Abnormal; Notable for the following:    RBC 3.52 (*)    Hemoglobin  9.9 (*)    HCT 28.9 (*)    All other components within normal limits  BASIC METABOLIC PANEL - Abnormal; Notable for the following:    Sodium 131 (*)    Chloride 100 (*)    CO2 18 (*)    Glucose, Bld 410 (*)    BUN 63 (*)    Creatinine, Ser 2.59 (*)    Calcium 8.2 (*)    GFR calc non Af Amer 23 (*)    GFR calc Af Amer 27 (*)    All other components within normal limits  GLUCOSE, CAPILLARY - Abnormal; Notable for the following:    Glucose-Capillary 463 (*)    All other components within normal limits  BASIC METABOLIC PANEL - Abnormal;  Notable for the following:    Potassium 5.2 (*)    CO2 16 (*)    Glucose, Bld 310 (*)    BUN 46 (*)    Creatinine, Ser 2.11 (*)    Calcium 8.2 (*)    GFR calc non Af Amer 30 (*)    GFR calc Af Amer 35 (*)    All other components within normal limits  GLUCOSE, CAPILLARY - Abnormal; Notable for the following:    Glucose-Capillary 374 (*)    All other components within normal limits  GLUCOSE, CAPILLARY - Abnormal; Notable for the following:    Glucose-Capillary 212 (*)    All other components within normal limits  GLUCOSE, CAPILLARY - Abnormal; Notable for the following:    Glucose-Capillary 462 (*)    All other components within normal limits  BASIC METABOLIC PANEL - Abnormal; Notable for the following:    CO2 19 (*)    Glucose, Bld 102 (*)    BUN 36 (*)    Creatinine, Ser 1.86 (*)    Calcium 8.4 (*)    GFR calc non Af Amer 35 (*)    GFR calc Af Amer 41 (*)    All other components within normal limits  GLUCOSE, CAPILLARY - Abnormal; Notable for the following:    Glucose-Capillary 349 (*)    All other components within normal limits  GLUCOSE, CAPILLARY - Abnormal; Notable for the following:    Glucose-Capillary 146 (*)    All other components within normal limits  CBG MONITORING, ED - Abnormal; Notable for the following:    Glucose-Capillary 426 (*)    All other components within normal limits  URINE CULTURE  URINE MICROSCOPIC-ADD ON   TACROLIMUS LEVEL  I-STAT CG4 LACTIC ACID, ED    Imaging Review Dg Chest 2 View  09/30/2014   CLINICAL DATA:  Hypotension and dizziness; cough  EXAM: CHEST  2 VIEW  COMPARISON:  June 24, 2014  FINDINGS: There is no edema or consolidation. The heart size and pulmonary vascularity are normal. No adenopathy. No bone lesions.  IMPRESSION: No edema or consolidation.   Electronically Signed   By: Lowella Grip III M.D.   On: 09/30/2014 07:39     EKG Interpretation   Date/Time:  Monday September 30 2014 07:01:59 EDT Ventricular Rate:  102 PR Interval:  130 QRS Duration: 85 QT Interval:  351 QTC Calculation: 457 R Axis:   71 Text Interpretation:  Sinus tachycardia ED PHYSICIAN INTERPRETATION  AVAILABLE IN [facility] Confirmed by TEST, Record ([id number]) on  10/01/2014 6:48:11 AM      MDM   Final diagnoses:  Dehydration  Renal insufficiency    Patient with dehydration. Creatinine has increased. Has renal transplant. Will admit to internal medicine discussed with nephrology    Davonna Belling, MD 10/01/14 [phone]

## 2014-09-30 NOTE — ED Notes (Signed)
Patient cbg was 91 Nurse was informed.

## 2014-09-30 NOTE — H&P (Signed)
History and Physical  MARITES WAYSON [id number] DOB: [birth year] DOA: 09/30/2014  Referring physician: Dr. Alvino Chapel, ED physician PCP: Elyn Peers, MD   Chief Complaint: Dehydration, dizziness  HPI: [name] is a 29 y.o. female  With a history of chronic kidney disease status post kidney transplant in 2007 on immunosuppressants. She has recently had pyelonephritis which was managed here at our hospital. She has been on antibiotics intermittently since April. She was recently placed on levofloxacin on Friday. She comes in with dizziness, the past couple of days which was worse this morning. She felt dry and so she came to the hospital for evaluation. No palliating or provoking factors. She has received some fluids, which has helped her dizziness.  She did have some emesis on Saturday, which has stopped. She additionally had nausea and vomiting on ciprofloxacin, which she was on prior to being on Levaquin.   Review of Systems:   Pt denies of fevers, chills, nausea, vomiting, chest pain, shortness of breath, abdominal pain, diarrhea, constipation, bleeding, oliguria, dysuria.  Review of systems are otherwise negative  Past Medical History  Diagnosis Date  . Chronic kidney disease   . Hypertension   . Hyperlipidemia   . Diabetes mellitus     Pt reports diagnosis in June 2011   Past Surgical History  Procedure Laterality Date  . Kidney transplant  2007  . Thyroid lobectomy    . Renal biopsy Bilateral 2003   Social History:  reports that she has never smoked. She has never used smokeless tobacco. She reports that she does not drink alcohol or use illicit drugs. Patient lives at home & is able to participate in activities of daily living  Allergies  Allergen Reactions  . Motrin [Ibuprofen] Shortness Of Breath and Itching    Per pt  . Banana Other (See Comments)    Sick on the stomach  . Diphenhydramine Hcl     REACTION: Stopped breathing in ICU  . Iron Dextran    REACTION: vein irritation  . Shellfish Allergy Hives    Family History  Problem Relation Age of Onset  . Arthritis Mother   . Diabetes Mother   . Hypertension Mother       Prior to Admission medications   Medication Sig Start Date End Date Taking? Authorizing Provider  acetaminophen (TYLENOL) 500 MG tablet Take 1,000 mg by mouth every 6 (six) hours as needed for mild pain.   Yes Historical Provider, MD  calcium carbonate (TUMS) 500 MG chewable tablet Chew 2 tablets by mouth 3 (three) times a week. Takes on Monday, wednesdays, and fridays   Yes Historical Provider, MD  hydrOXYzine (ATARAX) 25 MG tablet Take 25 mg by mouth 2 (two) times daily as needed for itching.    Yes Historical Provider, MD  levofloxacin (LEVAQUIN) 500 MG tablet Take 500 mg by mouth daily. 09/24/14 09/30/14 Yes Historical Provider, MD  mycophenolate (MYFORTIC) 360 MG TBEC Take 720 mg by mouth 2 (two) times daily.    Yes Historical Provider, MD  NOVOLOG 100 UNIT/ML injection Inject 0-18.5 Units into the skin 3 (three) times daily with meals.  06/16/14  Yes Historical Provider, MD  POLY-IRON 150 150 MG capsule Take 150 mg by mouth 2 (two) times daily as needed (supplement).  03/20/14  Yes Historical Provider, MD  predniSONE (DELTASONE) 5 MG tablet Take 5 mg by mouth at bedtime.    Yes Historical Provider, MD  ranitidine (ZANTAC) 150 MG tablet Take 1 tablet (150 mg total)  by mouth daily as needed for heartburn. 06/25/14  Yes Kinnie Feil, MD  simvastatin (ZOCOR) 20 MG tablet Take 20 mg by mouth every evening.   Yes Historical Provider, MD  sulfamethoxazole-trimethoprim (BACTRIM,SEPTRA) 400-80 MG per tablet Take 1 tablet by mouth every other day.    Yes Historical Provider, MD  tacrolimus (PROGRAF) 1 MG capsule Take 2 mg by mouth 2 (two) times daily.    Yes Historical Provider, MD  acetaminophen (TYLENOL) 325 MG tablet Take 2 tablets (650 mg total) by mouth every 6 (six) hours as needed for mild pain (or Fever >/=  101). Patient not taking: Reported on 09/30/2014 06/25/14   Kinnie Feil, MD  amoxicillin-clavulanate (AUGMENTIN) 875-125 MG per tablet Take 1 tablet by mouth 2 (two) times daily. Patient not taking: Reported on 09/30/2014 06/25/14   Kinnie Feil, MD    Physical Exam: BP 107/66 mmHg  Pulse 89  Temp(Src) 98.6 F (37 C) (Oral)  Resp 18  SpO2 95%  General: Young black female. Awake and alert and oriented x3. No acute cardiopulmonary distress.  Eyes: Pupils equal, round, reactive to light. Extraocular muscles are intact. Sclerae anicteric and noninjected.  ENT:  Moist mucosal membranes. No mucosal lesions. Teeth in good repair  Neck: Neck supple without lymphadenopathy. No carotid bruits. No masses palpated.  Cardiovascular: Regular rate with normal S1-S2 sounds. No murmurs, rubs, gallops auscultated. No JVD.  Respiratory: Good respiratory effort with no wheezes, rales, rhonchi. Lungs clear to auscultation bilaterally.  Abdomen: Soft, nontender, nondistended. Active bowel sounds. No masses or hepatosplenomegaly  Skin: Dry, warm to touch. 2+ dorsalis pedis and radial pulses. Musculoskeletal: No calf or leg pain. All major joints not erythematous nontender.  Psychiatric: Intact judgment and insight.  Neurologic: No focal neurological deficits. Cranial nerves II through XII are grossly intact.           Labs on Admission:  Basic Metabolic Panel:  Recent Labs Lab 09/30/14 0715  NA 127*  K 3.5  CL 92*  CO2 20*  GLUCOSE 335*  BUN 73*  CREATININE 3.12*  CALCIUM 8.7*   Liver Function Tests:  Recent Labs Lab 09/30/14 0715  AST 11*  ALT 13*  ALKPHOS 77  BILITOT 0.5  PROT 8.0  ALBUMIN 2.9*   No results for input(s): LIPASE, AMYLASE in the last 168 hours. No results for input(s): AMMONIA in the last 168 hours. CBC:  Recent Labs Lab 09/30/14 0715  WBC 9.0  NEUTROABS 6.3  HGB 9.9*  HCT 28.9*  MCV 82.1  PLT 354   Cardiac Enzymes: No results for input(s):  CKTOTAL, CKMB, CKMBINDEX, TROPONINI in the last 168 hours.  BNP (last 3 results) No results for input(s): BNP in the last 8760 hours.  ProBNP (last 3 results) No results for input(s): PROBNP in the last 8760 hours.  CBG: No results for input(s): GLUCAP in the last 168 hours.  Radiological Exams on Admission: Dg Chest 2 View  09/30/2014   CLINICAL DATA:  Hypotension and dizziness; cough  EXAM: CHEST  2 VIEW  COMPARISON:  June 24, 2014  FINDINGS: There is no edema or consolidation. The heart size and pulmonary vascularity are normal. No adenopathy. No bone lesions.  IMPRESSION: No edema or consolidation.   Electronically Signed   By: Lowella Grip III M.D.   On: 09/30/2014 07:39    EKG: Independently reviewed. Sinus tachycardia  Assessment/Plan Present on Admission:  . Acute renal failure . Dehydration  This patient was discussed with the  ED physician, including pertinent vitals, physical exam findings, labs, and imaging.  We also discussed care given by the ED provider.  Nephrology consulted on this patient.  They're comfortable with the patient being admitted here - it appears that the patient has dry and dehydrated and this is the cause of her acute renal failure. Will place the patient on IV fluids - patient has 2 L bolus done is currently producing urine. Will check creatinine at 4 PM and tomorrow morning. If creatinine not improved, patient will need to be transferred to hospital with the transplant team  DVT prophylaxis: SCDs   Consultants: Nephrology   Code Status: Full   Family Communication: None   Disposition Plan: Pending    Truett Mainland, DO [facility] Pager [phone]

## 2014-09-30 NOTE — ED Notes (Signed)
Pt requesting to take home "kidney medication".  RN spoke with Alvino Chapel MD who approves.

## 2014-10-01 DIAGNOSIS — E860 Dehydration: Secondary | ICD-10-CM

## 2014-10-01 DIAGNOSIS — N182 Chronic kidney disease, stage 2 (mild): Secondary | ICD-10-CM

## 2014-10-01 DIAGNOSIS — E1165 Type 2 diabetes mellitus with hyperglycemia: Secondary | ICD-10-CM

## 2014-10-01 DIAGNOSIS — Z940 Kidney transplant status: Secondary | ICD-10-CM

## 2014-10-01 DIAGNOSIS — N179 Acute kidney failure, unspecified: Principal | ICD-10-CM

## 2014-10-01 LAB — BASIC METABOLIC PANEL
ANION GAP: 9 (ref 5–15)
Anion gap: 10 (ref 5–15)
BUN: 46 mg/dL — ABNORMAL HIGH (ref 6–20)
BUN: 36 mg/dL — AB (ref 6–20)
CALCIUM: 8.4 mg/dL — AB (ref 8.9–10.3)
CHLORIDE: 109 mmol/L (ref 101–111)
CO2: 16 mmol/L — ABNORMAL LOW (ref 22–32)
CO2: 19 mmol/L — ABNORMAL LOW (ref 22–32)
Calcium: 8.2 mg/dL — ABNORMAL LOW (ref 8.9–10.3)
Chloride: 108 mmol/L (ref 101–111)
Creatinine, Ser: 2.11 mg/dL — ABNORMAL HIGH (ref 0.44–1.00)
Creatinine, Ser: 1.86 mg/dL — ABNORMAL HIGH (ref 0.44–1.00)
GFR calc Af Amer: 35 mL/min — ABNORMAL LOW (ref >60)
GFR calc Af Amer: 41 mL/min — ABNORMAL LOW (ref >60)
GFR calc non Af Amer: 35 mL/min — ABNORMAL LOW (ref >60)
GFR, EST NON AFRICAN AMERICAN: 30 mL/min — AB (ref >60)
GLUCOSE: 310 mg/dL — AB (ref 65–99)
GLUCOSE: 102 mg/dL — AB (ref 65–99)
POTASSIUM: 5.2 mmol/L — AB (ref 3.5–5.1)
Potassium: 3.9 mmol/L (ref 3.5–5.1)
SODIUM: 136 mmol/L (ref 135–145)
Sodium: 135 mmol/L (ref 135–145)

## 2014-10-01 LAB — GLUCOSE, CAPILLARY
GLUCOSE-CAPILLARY: 146 mg/dL — AB (ref 65–99)
Glucose-Capillary: 462 mg/dL — ABNORMAL HIGH (ref 65–99)
Glucose-Capillary: 349 mg/dL — ABNORMAL HIGH (ref 65–99)

## 2014-10-01 MED ORDER — INSULIN GLARGINE 100 UNIT/ML ~~LOC~~ SOLN
5.0000 [IU] | SUBCUTANEOUS | Status: DC
  Administered 2014-10-01: 5 [IU] via SUBCUTANEOUS
  Filled 2014-10-01: qty 0.05

## 2014-10-01 MED ORDER — PROMETHAZINE HCL 12.5 MG PO TABS: 12.5000 mg | ORAL_TABLET | Freq: Four times a day (QID) | ORAL | Status: DC | PRN

## 2014-10-01 MED ORDER — INSULIN GLARGINE 100 UNIT/ML ~~LOC~~ SOLN
45.0000 [IU] | Freq: Every day | SUBCUTANEOUS | Status: DC
  Administered 2014-10-01: 45 [IU] via SUBCUTANEOUS
  Filled 2014-10-01: qty 0.45

## 2014-10-01 MED ORDER — INSULIN ASPART 100 UNIT/ML ~~LOC~~ SOLN
3.0000 [IU] | Freq: Three times a day (TID) | SUBCUTANEOUS | Status: DC
  Administered 2014-10-01 (×2): 3 [IU] via SUBCUTANEOUS

## 2014-10-01 MED ORDER — SODIUM POLYSTYRENE SULFONATE 15 GM/60ML PO SUSP
30.0000 g | Freq: Once | ORAL | Status: AC
  Administered 2014-10-01: 30 g via ORAL
  Filled 2014-10-01: qty 120

## 2014-10-01 MED ORDER — SODIUM CHLORIDE 0.9 % IV SOLN
INTRAVENOUS | Status: DC
  Administered 2014-10-01: 11:00:00 via INTRAVENOUS

## 2014-10-01 NOTE — Progress Notes (Addendum)
Inpatient Diabetes Program Recommendations  AACE/ADA: New Consensus Statement on Inpatient Glycemic Control (2013)  Target Ranges:  Prepandial:   less than 140 mg/dL      Peak postprandial:   less than 180 mg/dL (1-2 hours)      Critically ill patients:  140 - 180 mg/dL    Outpatient Diabetes medications: Novolog insulin pump Basal rate 12a 2.25 units/hr 8 pm 2.5 units/hr  Total 55 units in 24 hrs Current orders for Inpatient glycemic control: Lantus 5 units, Novoklog Moderate scale (0-15 units TID) + HS scale (0-5 units ), Novolog 3 units meal coverage.   Inpatient Diabetes Program Recommendations Insulin - Basal: Glucose increased to 400's. Patient's basal rate equals to 55 units of basal insulin in a 24 hr period. Please consider increasing basal to 45 units Q24 hrs.   Note: patient does not have insulin pump supplies at the hospital. Patient can resume pump once discharged home. Patient is agreeable to this. I spoke with her about basal insulin here and not overlapping her last basal dose here with the time she starts her insulin pump back at home with her basal rates.  Thanks,  Tama Headings RN, MSN, [facility] Inpatient Diabetes Coordinator Team Pager [phone]

## 2014-10-01 NOTE — Progress Notes (Signed)
Reviewed discharge paperwork.  Pt denied any needs at this time.  PIV removed.  Pt taken to discharge location via wheelchair.

## 2014-10-01 NOTE — Discharge Summary (Signed)
Physician Discharge Summary   Patient ID: [name] MRN: [id number] DOB/AGE: 03-23-1985 29 y.o.  Admit date: 09/30/2014 Discharge date: 10/01/2014  Primary Care Physician:  Elyn Peers, MD  Discharge Diagnoses:    . Acute renal failure . Dehydration . DM (diabetes mellitus), type 2, uncontrolled . CHRONIC KIDNEY DISEASE STAGE II (MILD)  Consults:  Nephrology, Dr Deterding   Recommendations for Outpatient Follow-up:  Please follow urine culture and sensitivity    TESTS THAT NEED FOLLOW-UP BMET   DIET: carb modified diet    Allergies:   Allergies  Allergen Reactions  . Motrin [Ibuprofen] Shortness Of Breath and Itching    Per pt  . Banana Other (See Comments)    Sick on the stomach  . Diphenhydramine Hcl     REACTION: Stopped breathing in ICU  . Iron Dextran     REACTION: vein irritation  . Shellfish Allergy Hives     Discharge Medications:   Medication List    STOP taking these medications        levofloxacin 500 MG tablet  Commonly known as:  LEVAQUIN     sulfamethoxazole-trimethoprim 400-80 MG per tablet  Commonly known as:  BACTRIM,SEPTRA      TAKE these medications        acetaminophen 500 MG tablet  Commonly known as:  TYLENOL  Take 1,000 mg by mouth every 6 (six) hours as needed for mild pain.     calcium carbonate 500 MG chewable tablet  Commonly known as:  TUMS - dosed in mg elemental calcium  Chew 2 tablets by mouth 3 (three) times a week. Takes on Monday, wednesdays, and fridays     hydrOXYzine 25 MG tablet  Commonly known as:  ATARAX/VISTARIL  Take 25 mg by mouth 2 (two) times daily as needed for itching.     mycophenolate 360 MG Tbec EC tablet  Commonly known as:  MYFORTIC  Take 720 mg by mouth 2 (two) times daily.     NOVOLOG 100 UNIT/ML injection  Generic drug:  insulin aspart  Inject 0-18.5 Units into the skin 3 (three) times daily with meals.     POLY-IRON 150 150 MG capsule  Generic drug:  iron polysaccharides   Take 150 mg by mouth 2 (two) times daily as needed (supplement).     predniSONE 5 MG tablet  Commonly known as:  DELTASONE  Take 5 mg by mouth at bedtime.     promethazine 12.5 MG tablet  Commonly known as:  PHENERGAN  Take 1 tablet (12.5 mg total) by mouth every 6 (six) hours as needed for nausea or vomiting.     ranitidine 150 MG tablet  Commonly known as:  ZANTAC  Take 1 tablet (150 mg total) by mouth daily as needed for heartburn.     simvastatin 20 MG tablet  Commonly known as:  ZOCOR  Take 20 mg by mouth every evening.     tacrolimus 1 MG capsule  Commonly known as:  PROGRAF  Take 2 mg by mouth 2 (two) times daily.         Brief H and P: For complete details please refer to admission H and P, but in brief [name] is a 29 y.o. female with chronic kidney disease status post kidney transplant in 2007 on immunosuppressants. She has recently had pyelonephritis which was managed here at our hospital. She has been on antibiotics intermittently since April. She was recently placed on levofloxacin on Friday. Patient presented with dizziness  for the last 2-3 days and had been worse on the day of admission. She felt dry and dehydrated. Patient also reported emesis 3 days ago which had now resolved. She also reported nausea and vomiting on ciprofloxacin.   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 Creatinine 1.4 on 06/25/14 Patient presented with creatinine of 3.12, BUN 73, sodium of 127.  - Patient was placed on IV fluids, nephrology was consulted. - Creatinine has been slowly improving, per nephrology, Dr. Jimmy Footman, continue prednisone, mycophenolate and tacrolimus. Stop Bactrim. - Creatinine has been steadily improving with fluids. Cr improved to 1.8 at the discharge. She was cleared for discharge by  nephrology.  Hyperkalemia K 5.2 Likely due to potassium replacement, gave 1 dose of Kayexalate and discontinued potassium replacement and IV fluids K 3.9 at discharge. Recent pyelonephritis in April 2016 - Follow urine culture and sensitivities, patient had been on Levaquin prior to admission, discontinued antibiotics. - Urine culture negative so far   DM (diabetes mellitus), type 2, uncontrolled - Hemoglobin A1c 14.2 in 4/16 - Diabetes coordinator consult was obtained given patient is on insulin pump. Patient will resume insulin pump at home.    Day of Discharge BP 112/87 mmHg  Pulse 99  Temp(Src) 97.3 F (36.3 C) (Oral)  Resp 18  Ht 5\' 6"  (1.676 m)  Wt 77.111 kg (170 lb)  BMI 27.45 kg/m2  SpO2 100%  Physical Exam: General: Alert and awake oriented x3 not in any acute distress. HEENT: anicteric sclera, pupils reactive to light and accommodation CVS: S1-S2 clear no murmur rubs or gallops Chest: clear to auscultation bilaterally, no wheezing rales or rhonchi Abdomen: soft nontender, nondistended, normal bowel sounds Extremities: no cyanosis, clubbing or edema noted bilaterally Neuro: Cranial nerves II-XII intact, no focal neurological deficits   The results of significant diagnostics from this hospitalization (including imaging, microbiology, ancillary and laboratory) are listed below for reference.    LAB RESULTS: Basic Metabolic Panel:  Recent Labs Lab 10/01/14 0431 10/01/14 1730  NA 135 136  K 5.2* 3.9  CL 109 108  CO2 16* 19*  GLUCOSE 310* 102*  BUN 46* 36*  CREATININE 2.11* 1.86*  CALCIUM 8.2* 8.4*   Liver Function Tests:  Recent Labs Lab 09/30/14 0715  AST 11*  ALT 13*  ALKPHOS 77  BILITOT 0.5  PROT 8.0  ALBUMIN 2.9*   No results for input(s): LIPASE, AMYLASE in the last 168 hours. No results for input(s): AMMONIA in the last 168 hours. CBC:  Recent Labs Lab 09/30/14 0715  WBC 9.0  NEUTROABS 6.3  HGB 9.9*  HCT 28.9*  MCV 82.1  PLT  354   Cardiac Enzymes: No results for input(s): CKTOTAL, CKMB, CKMBINDEX, TROPONINI in the last 168 hours. BNP: Invalid input(s): POCBNP CBG:  Recent Labs Lab 10/01/14 1232 10/01/14 1705  GLUCAP 349* 146*    Significant Diagnostic Studies:  Dg Chest 2 View  09/30/2014   CLINICAL DATA:  Hypotension and dizziness; cough  EXAM: CHEST  2 VIEW  COMPARISON:  June 24, 2014  FINDINGS: There is no edema or consolidation. The heart size and pulmonary vascularity are normal. No adenopathy. No bone lesions.  IMPRESSION: No edema or consolidation.   Electronically Signed   By: Lowella Grip III M.D.   On: 09/30/2014 07:39    2D ECHO:   Disposition and Follow-up: Discharge Instructions  Diet Carb Modified    Complete by:  As directed      Increase activity slowly    Complete by:  As directed             DISPOSITION: home   DISCHARGE FOLLOW-UP Follow-up Information    Follow up with Elyn Peers, MD. Schedule an appointment as soon as possible for a visit in 10 days.   Specialty:  Family Medicine   Why:  for hospital follow-up, obtain labs, BMET for kidney function   Contact information:   [address] [place] [address] [phone]       Follow up with DETERDING,JAMES L, MD. Schedule an appointment as soon as possible for a visit in 10 days.   Specialty:  Nephrology   Why:  for hospital follow-up, obtain labs BMET for kidney function   Contact information:   [address] [place] [address] [phone]        Time spent on Discharge: 33 mins   Signed:   [name],RIPUDEEP M.D. [facility] 10/01/2014, 6:53 PM Pager: [phone]

## 2014-10-01 NOTE — Progress Notes (Signed)
Subjective:  [name] is a 29 yo female presenting with AKI secondary to hyperglycemic induced diuresis and dehydration. Patient was seen and examined this morning. She denies any complaints.   Objective:  Filed Vitals:   09/30/14 2115 09/30/14 2154 10/01/14 0031 10/01/14 0526  BP: 107/73  109/71 121/85  Pulse: 92  93 94  Temp: 98.7 F (37.1 C)  98.9 F (37.2 C) 98.5 F (36.9 C)  TempSrc: Oral  Oral Oral  Resp: 20  20 18   Height:  5\' 6"  (1.676 m)    Weight:  170 lb (77.111 kg)    SpO2: 100%  98% 100%   General: Vital signs reviewed. Patient is well-developed and well-nourished, in no acute distress and cooperative with exam.  Cardiovascular: RRR, S1 normal, S2 normal, no murmurs, gallops, or rubs. Pulmonary/Chest: Clear to auscultation bilaterally, no wheezes, rales, or rhonchi. Abdominal: Soft, non-tender, non-distended, BS + Extremities: No lower extremity edema bilaterally, pulses symmetric and intact bilaterally.  Skin: Warm, dry and intact. No rashes or erythema. Psychiatric: Normal mood and affect. speech and behavior is normal. Cognition and memory are normal.   Intake/Output from previous day: 07/18 0701 - 07/19 0700 In: 2103 [P.O.:478; I.V.:1625] Out: 2200 [Urine:2200] Intake/Output this shift: Total I/O In: -  Out: 1050 [Urine:1050] Lab Results:  Recent Labs  09/30/14 0715  WBC 9.0  HGB 9.9*  HCT 28.9*  PLT 354   BMET:   Recent Labs  09/30/14 1500 10/01/14 0431  NA 131* 135  K 3.7 5.2*  CL 100* 109  CO2 18* 16*  GLUCOSE 410* 310*  BUN 63* 46*  CREATININE 2.59* 2.11*  CALCIUM 8.2* 8.2*   CBG (last 3)   Recent Labs  09/30/14 2126 10/01/14 0846 10/01/14 1232  GLUCAP 212* 462* 349*    Studies/Results: Dg Chest 2 View  09/30/2014   CLINICAL DATA:  Hypotension and dizziness; cough  EXAM: CHEST  2 VIEW  COMPARISON:  June 24, 2014  FINDINGS: There is no edema or consolidation. The heart size and pulmonary vascularity are normal. No  adenopathy. No bone lesions.  IMPRESSION: No edema or consolidation.   Electronically Signed   By: Lowella Grip III M.D.   On: 09/30/2014 07:39   I have reviewed the patient's current medications. Prior to Admission:  Prescriptions prior to admission  Medication Sig Dispense Refill Last Dose  . acetaminophen (TYLENOL) 500 MG tablet Take 1,000 mg by mouth every 6 (six) hours as needed for mild pain.   Past Month at Unknown time  . calcium carbonate (TUMS) 500 MG chewable tablet Chew 2 tablets by mouth 3 (three) times a week. Takes on Monday, wednesdays, and fridays   Past Week at Unknown time  . hydrOXYzine (ATARAX) 25 MG tablet Take 25 mg by mouth 2 (two) times daily as needed for itching.    Past Week at Unknown time  . [EXPIRED] levofloxacin (LEVAQUIN) 500 MG tablet Take 500 mg by mouth daily.  0 09/29/2014 at Unknown time  . mycophenolate (MYFORTIC) 360 MG TBEC Take 720 mg by mouth 2 (two) times daily.    09/29/2014 at Unknown time  . NOVOLOG 100 UNIT/ML injection Inject 0-18.5 Units into the skin 3 (three) times daily with meals.   5 09/29/2014 at Unknown time  . POLY-IRON 150 150 MG capsule Take 150 mg by mouth 2 (two) times daily as needed (supplement).   5 Past Week at Unknown time  . predniSONE (DELTASONE) 5 MG tablet Take 5 mg by  mouth at bedtime.    09/29/2014 at Unknown time  . ranitidine (ZANTAC) 150 MG tablet Take 1 tablet (150 mg total) by mouth daily as needed for heartburn. 60 tablet 0 Past Week at Unknown time  . simvastatin (ZOCOR) 20 MG tablet Take 20 mg by mouth every evening.   09/29/2014 at Unknown time  . sulfamethoxazole-trimethoprim (BACTRIM,SEPTRA) 400-80 MG per tablet Take 1 tablet by mouth every other day.    Past Week at Unknown time  . tacrolimus (PROGRAF) 1 MG capsule Take 2 mg by mouth 2 (two) times daily.    09/29/2014 at Unknown time  . acetaminophen (TYLENOL) 325 MG tablet Take 2 tablets (650 mg total) by mouth every 6 (six) hours as needed for mild pain (or Fever  >/= 101). (Patient not taking: Reported on 09/30/2014)   Not Taking at Unknown time  . amoxicillin-clavulanate (AUGMENTIN) 875-125 MG per tablet Take 1 tablet by mouth 2 (two) times daily. (Patient not taking: Reported on 09/30/2014) 20 tablet 0 Completed Course at Unknown time   Scheduled: . calcium carbonate  2 tablet Oral Once per day on Mon Wed Fri  . famotidine  20 mg Oral Daily  . insulin aspart  0-15 Units Subcutaneous TID WC  . insulin aspart  0-5 Units Subcutaneous QHS  . insulin aspart  3 Units Subcutaneous TID WC  . insulin glargine  45 Units Subcutaneous Daily  . mycophenolate  720 mg Oral BID  . predniSONE  5 mg Oral QHS  . simvastatin  20 mg Oral QPM  . tacrolimus  2 mg Oral BID   Continuous:   [id number] **OR** acetaminophen, alum & mag hydroxide-simeth, hydrOXYzine, iron polysaccharides, ondansetron **OR** ondansetron (ZOFRAN) IV, polyethylene glycol  Assessment/Plan:  Acute on Chronic Kidney Disease: Improved. Likely secondary to dehydration from hyperglycemia induced diuresis. Creatinine has trended 3.12 >2.59 > 2.11 after rehydration. Baseline 1.5-1.8 as of April 2016, but recent office visit July 14 showed creatinine of 2.5. Patient is stable for discharge, recommend follow up with [facility], Nephrology will sign off.  -DISCONTINUE NS with KCl 20 mEq/[name] at 125 mL/hr -Follow up with Nephrology upon discharge -Stable for discharge from renal point of view  History of FSGS and ESRD s/p Renal Transplant: Transplant in 2007 at [facility]. Patient is currently on prednisone 5 mg daily, Mycophenolate 720 mg BID, Tacrolimus 2 mg BID, Bactrim 400-80 mg every other day, and TUMS 1000 mg three times weekly.  Renal Tx stable, awaiting prograf level.  Keep off Septra, and primary issue is Glu control or this will recurr. Needs education -Continue prednisone 5 mg daily -Continue Mycophenolate 720 mg BID -Continue Tacrolimus 2 mg BID -Tacrolimus trough  level in process -Holding Bactrim 400-80 mg every other day since it blocks Cr secretion, patient likely does not need this as she is 9 years out from her renal transplant.  -Continue TUMS 1000 mg three times weekly  History of Pyelonephritis: Discharged in April 2016 after treatment of pyelonephritis of transplanted kidney. Intermittently on abx. Recently prescribed Levaquin 500 mg daily (start date 09/24/14). -Now off Levaquin 500 mg daily -Urine culture pending  T2DM: Hyperglycemic on admission. HgbA1c 14.2 on 06/24/14. Patient is on Novolog insulin pump at home. Dehydration and AKI are likely secondary to poor diabetic control. Patient seems to have poor understanding of how to use her pump and what her basal rate is and bolus regimen is. She states her glucose always runs in the 300-400s.  -Per primary -Diabetic education  ordered  DVT/PE ppx: SCDs   NEPHROLOGY WILL SIGN OFF.    LOS: 1 day   Osa Craver, DO PGY-2 Internal Medicine Resident Pager # [phone] 10/01/2014 1:13 PM I have seen and examined this patient and agree with the plan of care seen,examined,eval, counseled patient, discussed with residents. .  [name] 10/01/2014, 4:50 PM

## 2014-10-01 NOTE — Care Management Note (Signed)
Case Management Note  Patient Details  Name: [name] MRN: [id number] Date of Birth: 02/26/1986  Subjective/Objective:       Patient lives alone,pta indep. NCM will cont to follow for dc needs.             Action/Plan:   Expected Discharge Date:                  Expected Discharge Plan:  Home/Self Care  In-House Referral:     Discharge planning Services  CM Consult  Post Acute Care Choice:    Choice offered to:     DME Arranged:    DME Agency:     HH Arranged:    HH Agency:     Status of Service:  In process, will continue to follow  Medicare Important Message Given:    Date Medicare IM Given:    Medicare IM give by:    Date Additional Medicare IM Given:    Additional Medicare Important Message give by:     If discussed at [place] of Stay Meetings, dates discussed:    Additional Comments:  [name], RN 10/01/2014, 11:52 AM

## 2014-10-01 NOTE — Progress Notes (Signed)
[facility]                                                                              Patient Demographics  Tanisia [name], is a 29 y.o. female, DOB - [birth year], [id number]  Admit date - 09/30/2014   Admitting Physician Truett Mainland, DO  Outpatient Primary MD for the patient is Elyn Peers, MD  LOS - 1   Chief Complaint  Patient presents with  . Dizziness       Brief HPI   [name] is a 29 y.o. female with chronic kidney disease status post kidney transplant in 2007 on immunosuppressants. She has recently had pyelonephritis which was managed here at our hospital. She has been on antibiotics intermittently since April. She was recently placed on levofloxacin on Friday. Patient presented with dizziness for the last 2-3 days and had been worse on the day of admission. She felt dry and dehydrated. Patient also reported emesis 3 days ago which had now resolved. She also reported nausea and vomiting on ciprofloxacin.   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 Creatinine 1.4 on 06/25/14 - Patient presented with creatinine of 3.12, BUN 73, sodium of 127 - Patient was placed on IV fluids, nephrology was consulted. - Creatinine has been slowly improving, per nephrology, Dr. Jimmy Footman, continue prednisone, mycophenolate and tacrolimus. Stop Bactrim. - Creatinine has been steadily improving, recheck later today  Active Problems: Hyperkalemia Likely due to potassium replacement, will give 1 dose of Kayexalate and discontinue potassium replacement and IV fluids  Recent pyelonephritis in April 2016 - Follow urine culture and sensitivities, patient had been on Levaquin prior to admission, discontinued - Urine culture negative so far    DM (diabetes mellitus), type 2,  uncontrolled - Hemoglobin A1c 14.2 in 4/16 - Diabetes coordinator consult was obtained given patient is on insulin pump. Appreciate recommendations   Code Status: Full code  Family Communication: Discussed in detail with the patient, all imaging results, lab results explained to the patient    Disposition Plan: DC home in a.m. if continues to improve and creatinine closer to baseline  Time Spent in minutes   67minutes  Procedures  None  Consults   Nephrology, Dr. Jimmy Footman  DVT Prophylaxis  SCDs  Medications  Scheduled Meds: . calcium carbonate  2 tablet Oral Once per day on Mon Wed Fri  . famotidine  20 mg Oral Daily  . insulin aspart  0-15 Units Subcutaneous TID WC  . insulin aspart  0-5 Units Subcutaneous QHS  . insulin aspart  3 Units Subcutaneous TID WC  . insulin glargine  45 Units Subcutaneous Daily  . mycophenolate  720 mg Oral BID  . predniSONE  5 mg Oral QHS  . simvastatin  20 mg Oral QPM  . tacrolimus  2 mg Oral BID   Continuous Infusions:  PRN Meds:.acetaminophen **OR** acetaminophen, alum & mag hydroxide-simeth, hydrOXYzine, iron polysaccharides, ondansetron **OR** ondansetron (ZOFRAN) IV, polyethylene glycol   Antibiotics  Anti-infectives    Start     Dose/Rate Route Frequency Ordered Stop   09/30/14 1315  levofloxacin (LEVAQUIN) tablet 500 mg  Status:  Discontinued     500 mg Oral Daily 09/30/14 1313 09/30/14 1607   09/30/14 1315  sulfamethoxazole-trimethoprim (BACTRIM,SEPTRA) 400-80 MG per tablet 1 tablet  Status:  Discontinued     1 tablet Oral Every other day 09/30/14 1313 09/30/14 1607        Subjective:   [name] was seen and examined today. Patient denies dizziness, chest pain, shortness of breath, new weakness, numbess, tingling. Feeling a lot better from yesterday, no nausea or vomiting or abdominal pain. No acute events overnight.    Objective:   Blood pressure 121/85, pulse 94, temperature 98.5 F (36.9 C), temperature source  Oral, resp. rate 18, height 5\' 6"  (1.676 m), weight 77.111 kg (170 lb), SpO2 100 %.  Wt Readings from Last 3 Encounters:  09/30/14 77.111 kg (170 lb)  08/30/14 72.576 kg (160 lb)  08/01/14 74.844 kg (165 lb)     Intake/Output Summary (Last 24 hours) at 10/01/14 1338 Last data filed at 10/01/14 1100  Gross per 24 hour  Intake   2103 ml  Output   3250 ml  Net  -1147 ml    Exam  General: Alert and oriented x 3, NAD  HEENT:  PERRLA, EOMI, Anicteric Sclera, mucous membranes moist.   Neck: Supple, no JVD, no masses  CVS: S1 S2 auscultated, no rubs, murmurs or gallops. Regular rate and rhythm.  Respiratory: Clear to auscultation bilaterally, no wheezing, rales or rhonchi  Abdomen: Soft, nontender, nondistended, + bowel sounds  Ext: no cyanosis clubbing or edema  Neuro: AAOx3, Cr N's II- XII. Strength 5/5 upper and lower extremities bilaterally  Skin: No rashes  Psych: Normal affect and demeanor, alert and oriented x3    Data Review   Micro Results No results found for this or any previous visit (from the past 240 hour(s)).  Radiology Reports Dg Chest 2 View  09/30/2014   CLINICAL DATA:  Hypotension and dizziness; cough  EXAM: CHEST  2 VIEW  COMPARISON:  June 24, 2014  FINDINGS: There is no edema or consolidation. The heart size and pulmonary vascularity are normal. No adenopathy. No bone lesions.  IMPRESSION: No edema or consolidation.   Electronically Signed   By: Lowella Grip III M.D.   On: 09/30/2014 07:39    CBC  Recent Labs Lab 09/30/14 0715  WBC 9.0  HGB 9.9*  HCT 28.9*  PLT 354  MCV 82.1  MCH 28.1  MCHC 34.3  RDW 14.4  LYMPHSABS 1.9  MONOABS 0.6  EOSABS 0.2  BASOSABS 0.0    Chemistries   Recent Labs Lab 09/30/14 0715 09/30/14 1500 10/01/14 0431  NA 127* 131* 135  K 3.5 3.7 5.2*  CL 92* 100* 109  CO2 20* 18* 16*  GLUCOSE 335* 410* 310*  BUN 73* 63* 46*  CREATININE 3.12* 2.59* 2.11*  CALCIUM 8.7* 8.2* 8.2*  AST 11*  --   --     ALT 13*  --   --   ALKPHOS 77  --   --   BILITOT 0.5  --   --    ------------------------------------------------------------------------------------------------------------------ estimated creatinine clearance is 40.5 mL/min (by C-G formula based on Cr of 2.11). ------------------------------------------------------------------------------------------------------------------ No results for input(s): HGBA1C in the last 72 hours. ------------------------------------------------------------------------------------------------------------------ No results for input(s): CHOL, HDL, LDLCALC, TRIG, CHOLHDL, LDLDIRECT in the last 72 hours. ------------------------------------------------------------------------------------------------------------------ No results for  input(s): TSH, T4TOTAL, T3FREE, THYROIDAB in the last 72 hours.  Invalid input(s): FREET3 ------------------------------------------------------------------------------------------------------------------ No results for input(s): VITAMINB12, FOLATE, FERRITIN, TIBC, IRON, RETICCTPCT in the last 72 hours.  Coagulation profile No results for input(s): INR, PROTIME in the last 168 hours.  No results for input(s): DDIMER in the last 72 hours.  Cardiac Enzymes No results for input(s): CKMB, TROPONINI, MYOGLOBIN in the last 168 hours.  Invalid input(s): CK ------------------------------------------------------------------------------------------------------------------ Invalid input(s): POCBNP   Recent Labs  09/30/14 1220 09/30/14 1423 09/30/14 1710 09/30/14 2126 10/01/14 0846 10/01/14 1232  GLUCAP 426* 463* 374* 212* 462* 349*     [name],RIPUDEEP M.D. [facility] 10/01/2014, 1:38 PM  Pager: [phone] Between 7am to 7pm - call Pager - [phone]  After 7pm go to [email or web address] - password [email or web address]  Call night coverage person covering after 7pm

## 2014-10-01 NOTE — ED Provider Notes (Signed)
CSN: [id number]     Arrival date & time 09/30/14  [id number] History   First MD Initiated Contact with Patient 09/30/14 0701     Chief Complaint  Patient presents with  . Dizziness     (Consider location/radiation/quality/duration/timing/severity/associated sxs/prior Treatment) HPI  Past Medical History  Diagnosis Date  . Chronic kidney disease   . Hypertension   . Hyperlipidemia   . Diabetes mellitus     Pt reports diagnosis in June 2011   Past Surgical History  Procedure Laterality Date  . Kidney transplant  2007  . Thyroid lobectomy    . Renal biopsy Bilateral 2003   Family History  Problem Relation Age of Onset  . Arthritis Mother   . Diabetes Mother   . Hypertension Mother    History  Substance Use Topics  . Smoking status: Never Smoker   . Smokeless tobacco: Never Used  . Alcohol Use: No   OB History    No data available     Review of Systems    Allergies  Motrin; Banana; Diphenhydramine hcl; Iron dextran; and Shellfish allergy  Home Medications   Prior to Admission medications   Medication Sig Start Date End Date Taking? Authorizing Provider  acetaminophen (TYLENOL) 500 MG tablet Take 1,000 mg by mouth every 6 (six) hours as needed for mild pain.   Yes Historical Provider, MD  calcium carbonate (TUMS) 500 MG chewable tablet Chew 2 tablets by mouth 3 (three) times a week. Takes on Monday, wednesdays, and fridays   Yes Historical Provider, MD  hydrOXYzine (ATARAX) 25 MG tablet Take 25 mg by mouth 2 (two) times daily as needed for itching.    Yes Historical Provider, MD  mycophenolate (MYFORTIC) 360 MG TBEC Take 720 mg by mouth 2 (two) times daily.    Yes Historical Provider, MD  NOVOLOG 100 UNIT/ML injection Inject 0-18.5 Units into the skin 3 (three) times daily with meals.  06/16/14  Yes Historical Provider, MD  POLY-IRON 150 150 MG capsule Take 150 mg by mouth 2 (two) times daily as needed (supplement).  03/20/14  Yes Historical Provider, MD  predniSONE  (DELTASONE) 5 MG tablet Take 5 mg by mouth at bedtime.    Yes Historical Provider, MD  ranitidine (ZANTAC) 150 MG tablet Take 1 tablet (150 mg total) by mouth daily as needed for heartburn. 06/25/14  Yes Kinnie Feil, MD  simvastatin (ZOCOR) 20 MG tablet Take 20 mg by mouth every evening.   Yes Historical Provider, MD  tacrolimus (PROGRAF) 1 MG capsule Take 2 mg by mouth 2 (two) times daily.    Yes Historical Provider, MD  promethazine (PHENERGAN) 12.5 MG tablet Take 1 tablet (12.5 mg total) by mouth every 6 (six) hours as needed for nausea or vomiting. 10/01/14   Ripudeep Krystal Eaton, MD   BP 112/87 mmHg  Pulse 99  Temp(Src) 97.3 F (36.3 C) (Oral)  Resp 18  Ht 5\' 6"  (1.676 m)  Wt 170 lb (77.111 kg)  BMI 27.45 kg/m2  SpO2 100% Physical Exam  ED Course  Procedures (including critical care time) Labs Review Labs Reviewed  COMPREHENSIVE METABOLIC PANEL - Abnormal; Notable for the following:    Sodium 127 (*)    Chloride 92 (*)    CO2 20 (*)    Glucose, Bld 335 (*)    BUN 73 (*)    Creatinine, Ser 3.12 (*)    Calcium 8.7 (*)    Albumin 2.9 (*)    AST 11 (*)  ALT 13 (*)    GFR calc non Af Amer 19 (*)    GFR calc Af Amer 22 (*)    All other components within normal limits  URINALYSIS, ROUTINE W REFLEX MICROSCOPIC (NOT AT [facility]) - Abnormal; Notable for the following:    APPearance CLOUDY (*)    Hgb urine dipstick TRACE (*)    Leukocytes, UA SMALL (*)    All other components within normal limits  CBC WITH DIFFERENTIAL/PLATELET - Abnormal; Notable for the following:    RBC 3.52 (*)    Hemoglobin 9.9 (*)    HCT 28.9 (*)    All other components within normal limits  BASIC METABOLIC PANEL - Abnormal; Notable for the following:    Sodium 131 (*)    Chloride 100 (*)    CO2 18 (*)    Glucose, Bld 410 (*)    BUN 63 (*)    Creatinine, Ser 2.59 (*)    Calcium 8.2 (*)    GFR calc non Af Amer 23 (*)    GFR calc Af Amer 27 (*)    All other components within normal limits  GLUCOSE,  CAPILLARY - Abnormal; Notable for the following:    Glucose-Capillary 463 (*)    All other components within normal limits  BASIC METABOLIC PANEL - Abnormal; Notable for the following:    Potassium 5.2 (*)    CO2 16 (*)    Glucose, Bld 310 (*)    BUN 46 (*)    Creatinine, Ser 2.11 (*)    Calcium 8.2 (*)    GFR calc non Af Amer 30 (*)    GFR calc Af Amer 35 (*)    All other components within normal limits  GLUCOSE, CAPILLARY - Abnormal; Notable for the following:    Glucose-Capillary 374 (*)    All other components within normal limits  GLUCOSE, CAPILLARY - Abnormal; Notable for the following:    Glucose-Capillary 212 (*)    All other components within normal limits  GLUCOSE, CAPILLARY - Abnormal; Notable for the following:    Glucose-Capillary 462 (*)    All other components within normal limits  BASIC METABOLIC PANEL - Abnormal; Notable for the following:    CO2 19 (*)    Glucose, Bld 102 (*)    BUN 36 (*)    Creatinine, Ser 1.86 (*)    Calcium 8.4 (*)    GFR calc non Af Amer 35 (*)    GFR calc Af Amer 41 (*)    All other components within normal limits  GLUCOSE, CAPILLARY - Abnormal; Notable for the following:    Glucose-Capillary 349 (*)    All other components within normal limits  GLUCOSE, CAPILLARY - Abnormal; Notable for the following:    Glucose-Capillary 146 (*)    All other components within normal limits  CBG MONITORING, ED - Abnormal; Notable for the following:    Glucose-Capillary 426 (*)    All other components within normal limits  URINE CULTURE  URINE MICROSCOPIC-ADD ON  TACROLIMUS LEVEL  I-STAT CG4 LACTIC ACID, ED    Imaging Review Dg Chest 2 View  09/30/2014   CLINICAL DATA:  Hypotension and dizziness; cough  EXAM: CHEST  2 VIEW  COMPARISON:  June 24, 2014  FINDINGS: There is no edema or consolidation. The heart size and pulmonary vascularity are normal. No adenopathy. No bone lesions.  IMPRESSION: No edema or consolidation.   Electronically Signed    By: Lowella Grip III M.D.  On: 09/30/2014 07:39     EKG Interpretation   Date/Time:  Monday September 30 2014 07:01:59 EDT Ventricular Rate:  102 PR Interval:  130 QRS Duration: 85 QT Interval:  351 QTC Calculation: 457 R Axis:   71 Text Interpretation:  Sinus tachycardia ED PHYSICIAN INTERPRETATION  AVAILABLE IN [place] Confirmed by TEST, Record ([id number]) on  10/01/2014 6:48:11 AM      MDM   Final diagnoses:  Dehydration  Renal insufficiency    Patient with dehydration. Creatinine has increased. Has renal transplant. Will admit to internal medicine discussed with nephrology.    Davonna Belling, MD 10/01/14 [phone]

## 2014-10-02 LAB — URINE CULTURE: Culture: >=100000

## 2014-10-02 LAB — TACROLIMUS LEVEL: Tacrolimus (FK506) - LabCorp: 7.8 ng/mL (ref 2.0–20.0)

## 2014-10-03 ENCOUNTER — Encounter (HOSPITAL_COMMUNITY)
Admission: RE | Admit: 2014-10-03 | Discharge: 2014-10-03 | Disposition: A | Discharge status: Home / Self Care | Payer: Medicare Other | Source: Ambulatory Visit | Attending: Nephrology | Admitting: Nephrology

## 2014-10-03 DIAGNOSIS — N183 Chronic kidney disease, stage 3 (moderate): Secondary | ICD-10-CM | POA: Diagnosis not present

## 2014-10-03 DIAGNOSIS — D631 Anemia in chronic kidney disease: Secondary | ICD-10-CM | POA: Diagnosis not present

## 2014-10-03 LAB — POCT HEMOGLOBIN-HEMACUE: HEMOGLOBIN: 10.7 g/dL — AB (ref 12.0–15.0)

## 2014-10-03 MED ORDER — EPOETIN ALFA 40000 UNIT/ML IJ SOLN: 30000.0000 [IU] | INTRAMUSCULAR | Status: DC

## 2014-10-03 MED ORDER — EPOETIN ALFA 10000 UNIT/ML IJ SOLN
INTRAMUSCULAR | Status: AC
  Administered 2014-10-03: 10000 [IU]
  Filled 2014-10-03: qty 1

## 2014-10-03 MED ORDER — EPOETIN ALFA 20000 UNIT/ML IJ SOLN
INTRAMUSCULAR | Status: AC
  Administered 2014-10-03: 20000 [IU]
  Filled 2014-10-03: qty 1

## 2014-10-04 DIAGNOSIS — Z940 Kidney transplant status: Secondary | ICD-10-CM | POA: Diagnosis not present

## 2014-10-17 ENCOUNTER — Encounter (HOSPITAL_COMMUNITY)
Admission: RE | Admit: 2014-10-17 | Discharge: 2014-10-17 | Disposition: A | Discharge status: Home / Self Care | Payer: Medicare Other | Source: Ambulatory Visit | Attending: Nephrology | Admitting: Nephrology

## 2014-10-17 DIAGNOSIS — N183 Chronic kidney disease, stage 3 (moderate): Secondary | ICD-10-CM | POA: Insufficient documentation

## 2014-10-17 DIAGNOSIS — D631 Anemia in chronic kidney disease: Secondary | ICD-10-CM | POA: Diagnosis not present

## 2014-10-17 LAB — POCT HEMOGLOBIN-HEMACUE: HEMOGLOBIN: 10.6 g/dL — AB (ref 12.0–15.0)

## 2014-10-17 LAB — IRON AND TIBC
Iron: 25 ug/dL — ABNORMAL LOW (ref 28–170)
Saturation Ratios: 12 % (ref 10.4–31.8)
TIBC: 214 ug/dL — ABNORMAL LOW (ref 250–450)
UIBC: 189 ug/dL

## 2014-10-17 LAB — FERRITIN: FERRITIN: 1334 ng/mL — AB (ref 11–307)

## 2014-10-17 MED ORDER — EPOETIN ALFA 10000 UNIT/ML IJ SOLN
INTRAMUSCULAR | Status: AC
  Administered 2014-10-17: 10000 [IU] via SUBCUTANEOUS
  Filled 2014-10-17: qty 1

## 2014-10-17 MED ORDER — EPOETIN ALFA 40000 UNIT/ML IJ SOLN: 30000.0000 [IU] | INTRAMUSCULAR | Status: DC

## 2014-10-17 MED ORDER — EPOETIN ALFA 20000 UNIT/ML IJ SOLN
INTRAMUSCULAR | Status: AC
  Administered 2014-10-17: 20000 [IU] via SUBCUTANEOUS
  Filled 2014-10-17: qty 1

## 2014-10-24 DIAGNOSIS — E119 Type 2 diabetes mellitus without complications: Secondary | ICD-10-CM | POA: Diagnosis not present

## 2014-10-24 DIAGNOSIS — K219 Gastro-esophageal reflux disease without esophagitis: Secondary | ICD-10-CM | POA: Diagnosis not present

## 2014-10-24 DIAGNOSIS — L209 Atopic dermatitis, unspecified: Secondary | ICD-10-CM | POA: Diagnosis not present

## 2014-10-28 DIAGNOSIS — N390 Urinary tract infection, site not specified: Secondary | ICD-10-CM | POA: Diagnosis not present

## 2014-10-28 DIAGNOSIS — E119 Type 2 diabetes mellitus without complications: Secondary | ICD-10-CM | POA: Diagnosis not present

## 2014-10-28 DIAGNOSIS — N1330 Unspecified hydronephrosis: Secondary | ICD-10-CM | POA: Diagnosis not present

## 2014-10-28 DIAGNOSIS — Z940 Kidney transplant status: Secondary | ICD-10-CM | POA: Diagnosis not present

## 2014-10-28 DIAGNOSIS — N186 End stage renal disease: Secondary | ICD-10-CM | POA: Diagnosis not present

## 2014-10-30 ENCOUNTER — Other Ambulatory Visit (HOSPITAL_COMMUNITY): Payer: Self-pay | Admitting: *Deleted

## 2014-10-30 DIAGNOSIS — Z940 Kidney transplant status: Secondary | ICD-10-CM | POA: Diagnosis not present

## 2014-10-30 DIAGNOSIS — D649 Anemia, unspecified: Secondary | ICD-10-CM | POA: Diagnosis not present

## 2014-10-31 ENCOUNTER — Encounter (HOSPITAL_COMMUNITY)
Admission: RE | Admit: 2014-10-31 | Discharge: 2014-10-31 | Disposition: A | Discharge status: Home / Self Care | Payer: Medicare Other | Source: Ambulatory Visit | Attending: Nephrology | Admitting: Nephrology

## 2014-10-31 DIAGNOSIS — D631 Anemia in chronic kidney disease: Secondary | ICD-10-CM | POA: Diagnosis not present

## 2014-10-31 DIAGNOSIS — Z940 Kidney transplant status: Secondary | ICD-10-CM | POA: Diagnosis not present

## 2014-10-31 DIAGNOSIS — D649 Anemia, unspecified: Secondary | ICD-10-CM | POA: Diagnosis not present

## 2014-10-31 DIAGNOSIS — N183 Chronic kidney disease, stage 3 (moderate): Secondary | ICD-10-CM | POA: Diagnosis not present

## 2014-10-31 DIAGNOSIS — I10 Essential (primary) hypertension: Secondary | ICD-10-CM | POA: Diagnosis not present

## 2014-10-31 DIAGNOSIS — N390 Urinary tract infection, site not specified: Secondary | ICD-10-CM | POA: Diagnosis not present

## 2014-10-31 LAB — POCT HEMOGLOBIN-HEMACUE: Hemoglobin: 11.5 g/dL — ABNORMAL LOW (ref 12.0–15.0)

## 2014-10-31 MED ORDER — EPOETIN ALFA 40000 UNIT/ML IJ SOLN: 30000.0000 [IU] | INTRAMUSCULAR | Status: DC

## 2014-10-31 MED ORDER — SODIUM CHLORIDE 0.9 % IV SOLN
510.0000 mg | Freq: Once | INTRAVENOUS | Status: AC
  Administered 2014-10-31: 510 mg via INTRAVENOUS
  Filled 2014-10-31: qty 17

## 2014-10-31 MED ORDER — EPOETIN ALFA 10000 UNIT/ML IJ SOLN
INTRAMUSCULAR | Status: AC
  Administered 2014-10-31: 10000 [IU]
  Filled 2014-10-31: qty 1

## 2014-10-31 MED ORDER — EPOETIN ALFA 20000 UNIT/ML IJ SOLN
INTRAMUSCULAR | Status: AC
  Administered 2014-10-31: 20000 [IU]
  Filled 2014-10-31: qty 1

## 2014-11-13 ENCOUNTER — Ambulatory Visit: Payer: Medicare Other | Admitting: Endocrinology

## 2014-11-13 ENCOUNTER — Other Ambulatory Visit (HOSPITAL_COMMUNITY): Payer: Self-pay | Admitting: *Deleted

## 2014-11-14 ENCOUNTER — Ambulatory Visit (INDEPENDENT_AMBULATORY_CARE_PROVIDER_SITE_OTHER): Payer: Medicare Other | Admitting: Endocrinology

## 2014-11-14 ENCOUNTER — Encounter: Payer: Self-pay | Admitting: Endocrinology

## 2014-11-14 ENCOUNTER — Encounter (HOSPITAL_COMMUNITY)
Admission: RE | Admit: 2014-11-14 | Discharge: 2014-11-14 | Disposition: A | Discharge status: Home / Self Care | Payer: Medicare Other | Source: Ambulatory Visit | Attending: Nephrology | Admitting: Nephrology

## 2014-11-14 VITALS — BP 122/87 | HR 98 | Temp 97.9°F | Ht 66.0 in | Wt 155.0 lb

## 2014-11-14 DIAGNOSIS — E1165 Type 2 diabetes mellitus with hyperglycemia: Secondary | ICD-10-CM | POA: Diagnosis not present

## 2014-11-14 DIAGNOSIS — N183 Chronic kidney disease, stage 3 (moderate): Secondary | ICD-10-CM | POA: Diagnosis not present

## 2014-11-14 DIAGNOSIS — E1022 Type 1 diabetes mellitus with diabetic chronic kidney disease: Secondary | ICD-10-CM

## 2014-11-14 DIAGNOSIS — D631 Anemia in chronic kidney disease: Secondary | ICD-10-CM | POA: Diagnosis not present

## 2014-11-14 DIAGNOSIS — IMO0002 Reserved for concepts with insufficient information to code with codable children: Secondary | ICD-10-CM

## 2014-11-14 DIAGNOSIS — N189 Chronic kidney disease, unspecified: Secondary | ICD-10-CM

## 2014-11-14 LAB — IRON AND TIBC
IRON: 118 ug/dL (ref 28–170)
SATURATION RATIOS: 47 % — AB (ref 10.4–31.8)
TIBC: 251 ug/dL (ref 250–450)
UIBC: 133 ug/dL

## 2014-11-14 LAB — POCT GLYCOSYLATED HEMOGLOBIN (HGB A1C): Hemoglobin A1C: 13.9

## 2014-11-14 LAB — FERRITIN: Ferritin: 1765 ng/mL — ABNORMAL HIGH (ref 11–307)

## 2014-11-14 MED ORDER — EPOETIN ALFA 20000 UNIT/ML IJ SOLN
INTRAMUSCULAR | Status: AC
  Administered 2014-11-14: 20000 [IU]
  Filled 2014-11-14: qty 1

## 2014-11-14 MED ORDER — EPOETIN ALFA 10000 UNIT/ML IJ SOLN
INTRAMUSCULAR | Status: AC
  Administered 2014-11-14: 10000 [IU]
  Filled 2014-11-14: qty 1

## 2014-11-14 MED ORDER — EPOETIN ALFA 40000 UNIT/ML IJ SOLN: 30000.0000 [IU] | INTRAMUSCULAR | Status: DC

## 2014-11-14 NOTE — Progress Notes (Signed)
Subjective:    Patient ID: [name], female    DOB: 1986/01/24, 29 y.o.   MRN: [id number]  HPI pt states DM was dx'ed in 2012, when she was on prednisone; she has mild neuropathy of the lower extremities; he is unaware of any associated renal failure (transplant in 2007); she has been on insulin since 6 mos after dx; she has been on pump rx since early 2016; pt says her diet and exercise are not very good; she has never had GDM (G0), pancreatitis, severe hypoglycemia.  She has had several episodes of mild DKA (most recently 2 mos ago).  She does not know her pump settings.  She has a meter that links to her pump, but it was disabled due to falling into water.  She denies hypoglycemia.   Past Medical History  Diagnosis Date  . Chronic kidney disease   . Hypertension   . Hyperlipidemia   . Diabetes mellitus     Pt reports diagnosis in June 2011    Past Surgical History  Procedure Laterality Date  . Kidney transplant  2007  . Thyroid lobectomy    . Renal biopsy Bilateral 2003    Social History   Social History  . Marital Status: Single    Spouse Name: N/A  . Number of Children: N/A  . Years of Education: N/A   Occupational History  . Not on file.   Social History Main Topics  . Smoking status: Never Smoker   . Smokeless tobacco: Never Used  . Alcohol Use: No  . Drug Use: No  . Sexual Activity: Yes    Birth Control/ Protection: None   Other Topics Concern  . Not on file   Social History Narrative    Current Outpatient Prescriptions on File Prior to Visit  Medication Sig Dispense Refill  . acetaminophen (TYLENOL) 500 MG tablet Take 1,000 mg by mouth every 6 (six) hours as needed for mild pain.    . calcium carbonate (TUMS) 500 MG chewable tablet Chew 2 tablets by mouth 3 (three) times a week. Takes on Monday, wednesdays, and fridays    . hydrOXYzine (ATARAX) 25 MG tablet Take 25 mg by mouth 2 (two) times daily as needed for itching.     . mycophenolate (MYFORTIC)  360 MG TBEC Take 720 mg by mouth 2 (two) times daily.     Marland Kitchen NOVOLOG 100 UNIT/ML injection Inject 0-18.5 Units into the skin 3 (three) times daily with meals.   5  . POLY-IRON 150 150 MG capsule Take 150 mg by mouth 2 (two) times daily as needed (supplement).   5  . predniSONE (DELTASONE) 5 MG tablet Take 5 mg by mouth at bedtime.     . promethazine (PHENERGAN) 12.5 MG tablet Take 1 tablet (12.5 mg total) by mouth every 6 (six) hours as needed for nausea or vomiting. 30 tablet 0  . ranitidine (ZANTAC) 150 MG tablet Take 1 tablet (150 mg total) by mouth daily as needed for heartburn. 60 tablet 0  . simvastatin (ZOCOR) 20 MG tablet Take 20 mg by mouth every evening.    . tacrolimus (PROGRAF) 1 MG capsule Take 2 mg by mouth 2 (two) times daily.      Current Facility-Administered Medications on File Prior to Visit  Medication Dose Route Frequency Provider Last Rate Last Dose  . epoetin alfa (EPOGEN,PROCRIT) injection 30,000 Units  30,000 Units Subcutaneous Q14 Days Jamal Maes, MD        Allergies  Allergen Reactions  . Motrin [Ibuprofen] Shortness Of Breath and Itching    Per pt  . Banana Other (See Comments)    Sick on the stomach  . Diphenhydramine Hcl     REACTION: Stopped breathing in ICU  . Iron Dextran     REACTION: vein irritation  . Shellfish Allergy Hives    Family History  Problem Relation Age of Onset  . Arthritis Mother   . Diabetes Mother   . Hypertension Mother     BP 122/87 mmHg  Pulse 98  Temp(Src) 97.9 F (36.6 C) (Oral)  Ht 5\' 6"  (1.676 m)  Wt 155 lb (70.308 kg)  BMI 25.03 kg/m2  SpO2 98%  Review of Systems denies blurry vision, headache, chest pain, sob, n/v, muscle cramps, excessive diaphoresis, depression, cold intolerance, rhinorrhea, and easy bruising.  She has lost weight.  She has urinary frequency.      Objective:   Physical Exam VS: see vs page GEN: no distress HEAD: head: no deformity eyes: no periorbital swelling, no proptosis external  nose and ears are normal mouth: no lesion seen NECK: Neck: a healed scar is present (parathyroidectomy).  i do not appreciate a nodule in the thyroid or elsewhere in the neck CHEST WALL: no deformity LUNGS:  Clear to auscultation CV: reg rate and rhythm, no murmur ABD: abdomen is soft, nontender.  no hepatosplenomegaly.  not distended.  no hernia MUSCULOSKELETAL: muscle bulk and strength are grossly normal.  no obvious joint swelling.  gait is normal and steady EXTEMITIES: no deformity.  no ulcer on the feet.  feet are of normal color and temp.  no edema PULSES: dorsalis pedis intact bilat.  no carotid bruit NEURO:  cn 2-12 grossly intact.   readily moves all 4's.  sensation is intact to touch on the feet SKIN:  Normal texture and temperature.  No rash or suspicious lesion is visible.   NODES:  None palpable at the neck PSYCH: alert, well-oriented.  Does not appear anxious nor depressed.  A1c=13.9%  i personally reviewed electrocardiogram tracing (09/30/14): Indication: dehydration Impression: sinus tachycardia  i have reviewed outside records: pt was seen by urol to f/u renal transplant, and had glycosuria.      Assessment & Plan:  DM: severe exacerbation.    Patient is advised the following: Patient Instructions  good diet and exercise significantly improve the control of your diabetes.  please let me know if you wish to be referred to a dietician.  high blood sugar is very risky to your health.  you should see an eye doctor and dentist every year.  It is very important to get all recommended vaccinations.  controlling your blood pressure and cholesterol drastically reduces the damage diabetes does to your body.  Those who smoke should quit.  please discuss these with your doctor.  check your blood sugar 4 times a day: before the 3 meals, and at bedtime.  also check if you have symptoms of your blood sugar being too high or too low.  please keep a record of the readings and bring it to  your next appointment here.  You can write it on any piece of paper.  please call us sooner if your blood sugar goes below 70, or if you have a lot of readings over 200. At our office, we are fortunate to have two specialists who are happy to help you:   [name], RN, CDE, is a diabetes educator and pump trainer.  She is here  on Monday mornings, and all day Tuesday and Wednesday.  She is can help you with low blood sugar avoidance and treatment, injecting insulin, sick day management, and others.   Antonieta Iba, RD is our dietician.  She is here all day Thursday and Friday.  She can advise you about a healthy diet.  She can also help you about a variety of special diabetes situations, such as shift work, Actor, gluten-free, diet for kidney patients, traveling with diabetes, and help for those who need to gain weight.   Please see Vaughan Basta next week, about your pump and meter.

## 2014-11-14 NOTE — Patient Instructions (Addendum)
good diet and exercise significantly improve the control of your diabetes.  please let me know if you wish to be referred to a dietician.  high blood sugar is very risky to your health.  you should see an eye doctor and dentist every year.  It is very important to get all recommended vaccinations.  controlling your blood pressure and cholesterol drastically reduces the damage diabetes does to your body.  Those who smoke should quit.  please discuss these with your doctor.  check your blood sugar 4 times a day: before the 3 meals, and at bedtime.  also check if you have symptoms of your blood sugar being too high or too low.  please keep a record of the readings and bring it to your next appointment here.  You can write it on any piece of paper.  please call us sooner if your blood sugar goes below 70, or if you have a lot of readings over 200. At our office, we are fortunate to have two specialists who are happy to help you:   [name], RN, CDE, is a diabetes educator and pump trainer.  She is here on Monday mornings, and all day Tuesday and Wednesday.  She is can help you with low blood sugar avoidance and treatment, injecting insulin, sick day management, and others.   Antonieta Iba, RD is our dietician.  She is here all day Thursday and Friday.  She can advise you about a healthy diet.  She can also help you about a variety of special diabetes situations, such as shift work, Actor, gluten-free, diet for kidney patients, traveling with diabetes, and help for those who need to gain weight.   Please see Vaughan Basta next week, about your pump and meter.

## 2014-11-15 LAB — POCT HEMOGLOBIN-HEMACUE: Hemoglobin: 11.8 g/dL — ABNORMAL LOW (ref 12.0–15.0)

## 2014-11-20 ENCOUNTER — Encounter: Payer: Medicare Other | Attending: Endocrinology | Admitting: Nutrition

## 2014-11-20 DIAGNOSIS — Z713 Dietary counseling and surveillance: Secondary | ICD-10-CM | POA: Insufficient documentation

## 2014-11-20 DIAGNOSIS — E0822 Diabetes mellitus due to underlying condition with diabetic chronic kidney disease: Secondary | ICD-10-CM | POA: Diagnosis not present

## 2014-11-20 NOTE — Patient Instructions (Signed)
Call when you get the meter in the mail, and I will help you link it to your pump.   Review carb counting handouts and call if questions.

## 2014-11-20 NOTE — Progress Notes (Signed)
Pt. Is here today because her [employer] contour link meter broke and she is using an old meter.  We called [employer] and they will send her out a new meter.  Because she is Medicaid, they only pay for accu-chek test strips.  I will have Dr. Loanne Drilling write a LOM for her to use the test strips.  She was given strips to last for 2-3 weeks until we can get  Them sent to her.   She said that her mother use to take care of all her pump supplies, and that she died in Oct 08, 2022, and she is lost about what to do.    She asked questions about carb counting, and we discussed this.  She was given a list of 15 gram carb portions, and we reviewed this.  She was also given a Nutrition In the [employer] brochure for when she eats out. We reviewed how to use this as well.  She had no final questions.   She had no final questions.

## 2014-11-28 ENCOUNTER — Encounter: Payer: Self-pay | Admitting: Endocrinology

## 2014-11-28 ENCOUNTER — Ambulatory Visit (INDEPENDENT_AMBULATORY_CARE_PROVIDER_SITE_OTHER): Payer: Medicare Other | Admitting: Endocrinology

## 2014-11-28 ENCOUNTER — Encounter (HOSPITAL_COMMUNITY)
Admission: RE | Admit: 2014-11-28 | Discharge: 2014-11-28 | Disposition: A | Discharge status: Home / Self Care | Payer: Medicare Other | Source: Ambulatory Visit | Attending: Nephrology | Admitting: Nephrology

## 2014-11-28 VITALS — BP 112/82 | HR 101 | Temp 97.8°F | Ht 66.0 in | Wt 161.0 lb

## 2014-11-28 DIAGNOSIS — Z940 Kidney transplant status: Secondary | ICD-10-CM | POA: Diagnosis not present

## 2014-11-28 DIAGNOSIS — D649 Anemia, unspecified: Secondary | ICD-10-CM | POA: Diagnosis not present

## 2014-11-28 DIAGNOSIS — N183 Chronic kidney disease, stage 3 (moderate): Secondary | ICD-10-CM | POA: Diagnosis not present

## 2014-11-28 DIAGNOSIS — D631 Anemia in chronic kidney disease: Secondary | ICD-10-CM | POA: Diagnosis not present

## 2014-11-28 DIAGNOSIS — E1022 Type 1 diabetes mellitus with diabetic chronic kidney disease: Secondary | ICD-10-CM

## 2014-11-28 DIAGNOSIS — N189 Chronic kidney disease, unspecified: Secondary | ICD-10-CM

## 2014-11-28 LAB — POCT HEMOGLOBIN-HEMACUE: Hemoglobin: 11.9 g/dL — ABNORMAL LOW (ref 12.0–15.0)

## 2014-11-28 MED ORDER — EPOETIN ALFA 40000 UNIT/ML IJ SOLN: 30000.0000 [IU] | INTRAMUSCULAR | Status: DC

## 2014-11-28 MED ORDER — GLUCOSE BLOOD VI STRP: ORAL_STRIP | Status: DC

## 2014-11-28 MED ORDER — EPOETIN ALFA 20000 UNIT/ML IJ SOLN
INTRAMUSCULAR | Status: AC
  Administered 2014-11-28: 20000 [IU] via SUBCUTANEOUS
  Filled 2014-11-28: qty 1

## 2014-11-28 MED ORDER — EPOETIN ALFA 10000 UNIT/ML IJ SOLN
INTRAMUSCULAR | Status: AC
  Administered 2014-11-28: 10000 [IU] via SUBCUTANEOUS
  Filled 2014-11-28: qty 1

## 2014-11-28 MED ORDER — NOVOLOG 100 UNIT/ML ~~LOC~~ SOLN: SUBCUTANEOUS | Status: DC

## 2014-11-28 NOTE — Patient Instructions (Addendum)
check your blood sugar 4 times a day: before the 3 meals, and at bedtime.  also check if you have symptoms of your blood sugar being too high or too low.  please keep a record of the readings and bring it to your next appointment here.  You can write it on any piece of paper.  please call us sooner if your blood sugar goes below 70, or if you have a lot of readings over 200.  Please see Vaughan Basta next week, about your pump.   Please come back for a follow-up appointment in 2 weeks i have sent prescriptions to your pharmacy, to refill insulin, strips, and lancets.

## 2014-11-28 NOTE — Progress Notes (Signed)
Subjective:    Patient ID: [name], female    DOB: 02-May-1985, 29 y.o.   MRN: [id number]  HPI  Pt returns for f/u of diabetes mellitus: DM type: 1 Dx'ed: 2012, when she was on prednisone Complications: polyneuropathy and renal failure (transplant in 2007) Therapy: insulin since soon after dx GDM: never (G0) DKA: several episodes, most recently in mid-2016 Severe hypoglycemia: never Pancreatitis: never Other: she has been on pump rx since early 2016; she does not know pump settings. Interval history: She has received the replacement pump the mail.  On her current insulin, she still does not know her settings.  She is taking correction boluses throughout the day.  Past Medical History  Diagnosis Date  . Chronic kidney disease   . Hypertension   . Hyperlipidemia   . Diabetes mellitus     Pt reports diagnosis in June 2011    Past Surgical History  Procedure Laterality Date  . Kidney transplant  2007  . Thyroid lobectomy    . Renal biopsy Bilateral 2003    Social History   Social History  . Marital Status: Single    Spouse Name: N/A  . Number of Children: N/A  . Years of Education: N/A   Occupational History  . Not on file.   Social History Main Topics  . Smoking status: Never Smoker   . Smokeless tobacco: Never Used  . Alcohol Use: No  . Drug Use: No  . Sexual Activity: Yes    Birth Control/ Protection: None   Other Topics Concern  . Not on file   Social History Narrative    Current Outpatient Prescriptions on File Prior to Visit  Medication Sig Dispense Refill  . acetaminophen (TYLENOL) 500 MG tablet Take 1,000 mg by mouth every 6 (six) hours as needed for mild pain.    . calcium carbonate (TUMS) 500 MG chewable tablet Chew 2 tablets by mouth 3 (three) times a week. Takes on Monday, wednesdays, and fridays    . hydrOXYzine (ATARAX) 25 MG tablet Take 25 mg by mouth 2 (two) times daily as needed for itching.     . mycophenolate (MYFORTIC) 360 MG TBEC  Take 720 mg by mouth 2 (two) times daily.     Marland Kitchen POLY-IRON 150 150 MG capsule Take 150 mg by mouth 2 (two) times daily as needed (supplement).   5  . predniSONE (DELTASONE) 5 MG tablet Take 5 mg by mouth at bedtime.     . promethazine (PHENERGAN) 12.5 MG tablet Take 1 tablet (12.5 mg total) by mouth every 6 (six) hours as needed for nausea or vomiting. 30 tablet 0  . ranitidine (ZANTAC) 150 MG tablet Take 1 tablet (150 mg total) by mouth daily as needed for heartburn. 60 tablet 0  . simvastatin (ZOCOR) 20 MG tablet Take 20 mg by mouth every evening.    . tacrolimus (PROGRAF) 1 MG capsule Take 2 mg by mouth 2 (two) times daily.      No current facility-administered medications on file prior to visit.    Allergies  Allergen Reactions  . Motrin [Ibuprofen] Shortness Of Breath and Itching    Per pt  . Banana Other (See Comments)    Sick on the stomach  . Diphenhydramine Hcl     REACTION: Stopped breathing in ICU  . Iron Dextran     REACTION: vein irritation  . Shellfish Allergy Hives    Family History  Problem Relation Age of Onset  . Arthritis  Mother   . Diabetes Mother   . Hypertension Mother     BP 112/82 mmHg  Pulse 101  Temp(Src) 97.8 F (36.6 C) (Oral)  Ht 5\' 6"  (1.676 m)  Wt 161 lb (73.029 kg)  BMI 26.00 kg/m2  SpO2 97%   Review of Systems She denies hypoglycemia    Objective:   Physical Exam VITAL SIGNS:  See vs page GENERAL: no distress PSYCH: Alert and well-oriented.  Does not appear anxious nor depressed.  Lab Results  Component Value Date   HGBA1C 13.9 11/14/2014       Assessment & Plan:  DM: ongoing very poor control.    Patient is advised the following: Patient Instructions  check your blood sugar 4 times a day: before the 3 meals, and at bedtime.  also check if you have symptoms of your blood sugar being too high or too low.  please keep a record of the readings and bring it to your next appointment here.  You can write it on any piece of paper.   please call us sooner if your blood sugar goes below 70, or if you have a lot of readings over 200.  Please see Vaughan Basta next week, about your pump.   Please come back for a follow-up appointment in 2 weeks i have sent prescriptions to your pharmacy, to refill insulin, strips, and lancets.

## 2014-12-08 DIAGNOSIS — Z23 Encounter for immunization: Secondary | ICD-10-CM | POA: Diagnosis not present

## 2014-12-09 ENCOUNTER — Telehealth: Payer: Self-pay | Admitting: Nutrition

## 2014-12-09 ENCOUNTER — Encounter: Payer: Medicare Other | Admitting: Nutrition

## 2014-12-09 DIAGNOSIS — Z713 Dietary counseling and surveillance: Secondary | ICD-10-CM | POA: Diagnosis not present

## 2014-12-09 DIAGNOSIS — E0829 Diabetes mellitus due to underlying condition with other diabetic kidney complication: Secondary | ICD-10-CM

## 2014-12-09 DIAGNOSIS — E0822 Diabetes mellitus due to underlying condition with diabetic chronic kidney disease: Secondary | ICD-10-CM | POA: Diagnosis not present

## 2014-12-09 NOTE — Progress Notes (Signed)
Patient is here to review blood sugars.  Her pump was not able to download.  Her meter showed FBSs: 100-240, acL(,2-6PM): 159-300, acS: (8-10PM):240-300,.  She says she drops low at least twice a week after lunch, needing to treat it with some candy, while at work.   She take prednisone at 10PM (0.5mg ) causing her blood sugars to go over 400, sometimes as high as 600.   Plan:  Per Dr. Cordelia Pen verbal order that was reverbalized to him, she was told to take a 6u bolus after taking the perdnisone and to increase the amount of insulin for each meal for a carb ratio of 1u/6.4 to 1u/5.  I explained this to her.  She was in agreement to do this at [facility], but wants to wait 3 days before making the bolus changes.  She promised to call me with  Blood sugar readings on Wednesday.  She was given a log book and told to test ac and HS.  She agreed to do this.   She has low symptoms at 100, and has her target set to 140.  She admits to sometimes taking snacks without bolusing.  She agreed not to do this for the next 2 days and call me the readings.   She was shown how to do a temp basal rate--but increasing for sick days, and decreasing for active days.  She re demonstrated this correctly, both starting and stopping this rate.  She reported good understanding of when and how to do this.   Plan: 1.  Take 6u bolus when taking oral prednisone at HS 2.  Increase your I/C ratio from 6.4 to 5 3.  Test before meals and at HS 4.  Always bolus before eating any meal or snack, unless treating a low blood sugar

## 2014-12-09 NOTE — Telephone Encounter (Signed)
Patient is here for a follow up appt. Unable to download pump. Meter memory shows blood sugars going high (400s-600s) at 11PM. She takes 0.5mg  Prednisone q HS, due to kidney transplant.  Her ISF is 24. Can she take a 6u bolus when she takes this prednisone? Her basal rates is MN: 2.25u/hr, 8PM: 2.5u/hr. 65u basal, and approximately 35u boluses/day

## 2014-12-09 NOTE — Patient Instructions (Addendum)
Test before meals and at bedtime and call readings to me on Wednesday  Take insulin before all meals and snacks unless it is used to treat a low blood sugar. Increase your meal boluses by changing your I/C ratio from 6.4 to 5.

## 2014-12-09 NOTE — Telephone Encounter (Signed)
Patient is here for a follow up appt.  Unable to download pump.  Meter memory shows blood sugars going high (400s-600s) at 11PM.  She takes 0.5mg   Prednisone q HS, due to kidney transplant.   Her ISF is 24.  Can she take a 6u bolus when she takes this prednisone? Her basal rates is MN: 2.25u/hr, 8PM: 2.5u/hr. 65u basal, and approximately 35u boluses/day

## 2014-12-09 NOTE — Telephone Encounter (Signed)
Please increase bolus to 1 unit/5 grams CHO Please continue the same basal Also please increase correction bolus as we discussed today

## 2014-12-12 ENCOUNTER — Encounter: Payer: Self-pay | Admitting: Endocrinology

## 2014-12-12 ENCOUNTER — Ambulatory Visit (INDEPENDENT_AMBULATORY_CARE_PROVIDER_SITE_OTHER): Payer: Medicare Other | Admitting: Endocrinology

## 2014-12-12 ENCOUNTER — Encounter (HOSPITAL_COMMUNITY)
Admission: RE | Admit: 2014-12-12 | Discharge: 2014-12-12 | Disposition: A | Discharge status: Home / Self Care | Payer: Medicare Other | Source: Ambulatory Visit | Attending: Nephrology | Admitting: Nephrology

## 2014-12-12 VITALS — BP 128/87 | HR 107 | Temp 98.1°F | Ht 66.0 in | Wt 161.0 lb

## 2014-12-12 DIAGNOSIS — E0829 Diabetes mellitus due to underlying condition with other diabetic kidney complication: Secondary | ICD-10-CM

## 2014-12-12 DIAGNOSIS — N183 Chronic kidney disease, stage 3 (moderate): Secondary | ICD-10-CM | POA: Diagnosis not present

## 2014-12-12 DIAGNOSIS — D631 Anemia in chronic kidney disease: Secondary | ICD-10-CM | POA: Diagnosis not present

## 2014-12-12 LAB — IRON AND TIBC
IRON: 92 ug/dL (ref 28–170)
SATURATION RATIOS: 33 % — AB (ref 10.4–31.8)
TIBC: 276 ug/dL (ref 250–450)
UIBC: 184 ug/dL

## 2014-12-12 LAB — POCT HEMOGLOBIN-HEMACUE: Hemoglobin: 12.6 g/dL (ref 12.0–15.0)

## 2014-12-12 LAB — FERRITIN: Ferritin: 1320 ng/mL — ABNORMAL HIGH (ref 11–307)

## 2014-12-12 MED ORDER — EPOETIN ALFA 40000 UNIT/ML IJ SOLN: 30000.0000 [IU] | INTRAMUSCULAR | Status: DC

## 2014-12-12 NOTE — Progress Notes (Signed)
Subjective:    Patient ID: DOMINGO CARP, female    DOB: 11/14/1985, 29 y.o.   MRN: [id number]  HPI Pt returns for f/u of diabetes mellitus: DM type: 1 Dx'ed: 2012, when she was on prednisone Complications: polyneuropathy and renal failure (transplant in 2007) Therapy: insulin since soon after dx GDM: never (G0) DKA: several episodes, most recently in mid-2016 Severe hypoglycemia: never Pancreatitis: never Other: she has been on pump rx since early 2016; she does not know pump settings. Interval history: Pt does not know her pump settings: continue basal rate of 2.25units/hr, except for 2.5 units/hr, 3 am to 6 am continue mealtime bolus of 1 unit/5 grams carbohydrate her "correction bolus" is unclear.  She takes prednisone, 15 mg at hs.  She says this dosage will continue indefinitely.  no cbg record, but states cbg's vary from 98-500.  She says it is lowest in the middle of the night.   Past Medical History  Diagnosis Date  . Chronic kidney disease   . Hypertension   . Hyperlipidemia   . Diabetes mellitus     Pt reports diagnosis in June 2011    Past Surgical History  Procedure Laterality Date  . Kidney transplant  2007  . Thyroid lobectomy    . Renal biopsy Bilateral 2003    Social History   Social History  . Marital Status: Single    Spouse Name: N/A  . Number of Children: N/A  . Years of Education: N/A   Occupational History  . Not on file.   Social History Main Topics  . Smoking status: Never Smoker   . Smokeless tobacco: Never Used  . Alcohol Use: No  . Drug Use: No  . Sexual Activity: Yes    Birth Control/ Protection: None   Other Topics Concern  . Not on file   Social History Narrative    Current Outpatient Prescriptions on File Prior to Visit  Medication Sig Dispense Refill  . acetaminophen (TYLENOL) 500 MG tablet Take 1,000 mg by mouth every 6 (six) hours as needed for mild pain.    . calcium carbonate (TUMS) 500 MG chewable tablet Chew 2  tablets by mouth 3 (three) times a week. Takes on Monday, wednesdays, and fridays    . glucose blood (BAYER CONTOUR NEXT TEST) test strip Check 8 times per day, and lancets 300 each 12  . hydrOXYzine (ATARAX) 25 MG tablet Take 25 mg by mouth 2 (two) times daily as needed for itching.     . mycophenolate (MYFORTIC) 360 MG TBEC Take 720 mg by mouth 2 (two) times daily.     Marland Kitchen NOVOLOG 100 UNIT/ML injection For use in pump, total of 80 units per day. 30 mL 11  . POLY-IRON 150 150 MG capsule Take 150 mg by mouth 2 (two) times daily as needed (supplement).   5  . predniSONE (DELTASONE) 5 MG tablet Take 5 mg by mouth at bedtime.     . promethazine (PHENERGAN) 12.5 MG tablet Take 1 tablet (12.5 mg total) by mouth every 6 (six) hours as needed for nausea or vomiting. 30 tablet 0  . ranitidine (ZANTAC) 150 MG tablet Take 1 tablet (150 mg total) by mouth daily as needed for heartburn. 60 tablet 0  . simvastatin (ZOCOR) 20 MG tablet Take 20 mg by mouth every evening.    . tacrolimus (PROGRAF) 1 MG capsule Take 2 mg by mouth 2 (two) times daily.      No current facility-administered medications  on file prior to visit.    Allergies  Allergen Reactions  . Motrin [Ibuprofen] Shortness Of Breath and Itching    Per pt  . Banana Other (See Comments)    Sick on the stomach  . Diphenhydramine Hcl     REACTION: Stopped breathing in ICU  . Iron Dextran     REACTION: vein irritation  . Shellfish Allergy Hives    Family History  Problem Relation Age of Onset  . Arthritis Mother   . Diabetes Mother   . Hypertension Mother     BP 128/87 mmHg  Pulse 107  Temp(Src) 98.1 F (36.7 C) (Oral)  Ht 5\' 6"  (1.676 m)  Wt 161 lb (73.029 kg)  BMI 26.00 kg/m2  SpO2 97%  Review of Systems Denies LOC    Objective:   Physical Exam VITAL SIGNS:  See vs page GENERAL: no distress Pulses: dorsalis pedis intact bilat.   MSK: no deformity of the feet CV: no leg edema Skin:  no ulcer on the feet.  normal color and  temp on the feet. Neuro: sensation is intact to touch on the feet, but decreased from normal.     Lab Results  Component Value Date   HGBA1C 13.9 11/14/2014      Assessment & Plan:  DM: apparently poorly-controlled Noncompliance with cbg recording, persistent: I'll work around this as best I can  Patient is advised the following: Patient Instructions  check your blood sugar 4 times a day: before the 3 meals, and at bedtime.  also check if you have symptoms of your blood sugar being too high or too low.  please keep a record of the readings and bring it to your next appointment here.  You can write it on any piece of paper.  please call us sooner if your blood sugar goes below 70, or if you have a lot of readings over 200.  Please go back to see Vaughan Basta about your pump.   Please come back for a follow-up appointment in 2 weeks.   The next step is for you to know your pump settings and to keep a record of your blood sugar.

## 2014-12-12 NOTE — Patient Instructions (Addendum)
check your blood sugar 4 times a day: before the 3 meals, and at bedtime.  also check if you have symptoms of your blood sugar being too high or too low.  please keep a record of the readings and bring it to your next appointment here.  You can write it on any piece of paper.  please call us sooner if your blood sugar goes below 70, or if you have a lot of readings over 200.  Please go back to see Vaughan Basta about your pump.   Please come back for a follow-up appointment in 2 weeks.   The next step is for you to know your pump settings and to keep a record of your blood sugar.

## 2014-12-19 DIAGNOSIS — E139 Other specified diabetes mellitus without complications: Secondary | ICD-10-CM | POA: Diagnosis not present

## 2014-12-19 DIAGNOSIS — I10 Essential (primary) hypertension: Secondary | ICD-10-CM | POA: Diagnosis not present

## 2014-12-19 DIAGNOSIS — D649 Anemia, unspecified: Secondary | ICD-10-CM | POA: Diagnosis not present

## 2014-12-19 DIAGNOSIS — Z940 Kidney transplant status: Secondary | ICD-10-CM | POA: Diagnosis not present

## 2014-12-25 ENCOUNTER — Encounter: Payer: Medicare Other | Attending: Endocrinology | Admitting: Nutrition

## 2014-12-25 ENCOUNTER — Ambulatory Visit (INDEPENDENT_AMBULATORY_CARE_PROVIDER_SITE_OTHER): Payer: Medicare Other | Admitting: Endocrinology

## 2014-12-25 ENCOUNTER — Encounter: Payer: Self-pay | Admitting: Endocrinology

## 2014-12-25 VITALS — BP 130/90 | HR 96 | Temp 99.0°F | Wt 160.0 lb

## 2014-12-25 DIAGNOSIS — N2581 Secondary hyperparathyroidism of renal origin: Secondary | ICD-10-CM | POA: Diagnosis not present

## 2014-12-25 DIAGNOSIS — E1042 Type 1 diabetes mellitus with diabetic polyneuropathy: Secondary | ICD-10-CM

## 2014-12-25 DIAGNOSIS — E0810 Diabetes mellitus due to underlying condition with ketoacidosis without coma: Secondary | ICD-10-CM | POA: Diagnosis not present

## 2014-12-25 DIAGNOSIS — Z713 Dietary counseling and surveillance: Secondary | ICD-10-CM | POA: Insufficient documentation

## 2014-12-25 DIAGNOSIS — Z940 Kidney transplant status: Secondary | ICD-10-CM | POA: Diagnosis not present

## 2014-12-25 DIAGNOSIS — E0822 Diabetes mellitus due to underlying condition with diabetic chronic kidney disease: Secondary | ICD-10-CM | POA: Insufficient documentation

## 2014-12-25 DIAGNOSIS — E785 Hyperlipidemia, unspecified: Secondary | ICD-10-CM | POA: Diagnosis not present

## 2014-12-25 MED ORDER — NOVOLOG 100 UNIT/ML ~~LOC~~ SOLN: SUBCUTANEOUS | Status: DC

## 2014-12-25 NOTE — Patient Instructions (Addendum)
check your blood sugar 4 times a day: before the 3 meals, and at bedtime.  also check if you have symptoms of your blood sugar being too high or too low.  please keep a record of the readings and bring it to your next appointment here.  You can write it on any piece of paper.  please call us sooner if your blood sugar goes below 70, or if you have a lot of readings over 200.  continue basal rate of 2.2 units/hr, 24 hrs per day.   continue mealtime bolus of 1 unit/ 10 grams carbohydrate.   continue correction bolus (which some people call "sensitivity," or "insulin sensitivity ratio," or just "isr") of 1 unit for each 50 by which your glucose exceeds 150 Please come back for a follow-up appointment in 2 weeks.

## 2014-12-25 NOTE — Patient Instructions (Signed)
Take insulin before all meals and snacks Do not change the blood sugar on the meter, before calculating the correction insulin dose Keep all test strips in the vial with the lid closed tightly

## 2014-12-25 NOTE — Progress Notes (Signed)
Patient's blood sugar was over 600 during the night last night.  She changed the setting to 450 in her pump so "she would be given a lot of insulin to make her drop low.   While coming here, she felt her blood sugar dropping, she she got a soda and drank 50 grams of carb.  Her blood sugar was 373 in the office. Discussed the need to test blood sugar first before drinking anything.  Stressed this. Also she is storing the strips in the case out of the carton.  Discussed that the moisture in the air will cause the strips to read lower.  Her meter read 322, mine read 373.   She is also not taking her insulin before the meals, and forgetting to take it sometimes after she eats.  Stressed the need to take this before all meals and snacks for better blood sugar control. She did not know how to change the settings on her pump.  She was shown how to bring up the basal rates, how to change them, and also how to do the carb settings.  She re demonstrated this for me 2 times. Need to revisit her in 2 weeks to see if she is doing all of the above.  She agreed to meet with me.

## 2014-12-25 NOTE — Progress Notes (Signed)
Subjective:    Patient ID: [name], female    DOB: [birth year], 29 y.o.   MRN: [id number]  HPI Pt returns for f/u of diabetes mellitus: DM type: 1 Dx'ed: 2012, when she was on prednisone Complications: polyneuropathy and renal failure (transplant in 2007) Therapy: insulin since soon after dx GDM: (G0) DKA: several episodes, most recently in mid-2016 Severe hypoglycemia: never Pancreatitis: never Other: she has been on pump rx since early 2016; she is chronically on prednisone.  Interval history:  [name] has found pump to have these settings: basal rate of 2.25 units/hr, except for 2.5 units/hr, 8 PM-midnight.  mealtime bolus of 1 unit/6.4 grams (B,S), and 1 unit/10 grams at lunch. "correction bolus" of 1 unit/24 mg% over 140.   Pt is sometimes taking mealtime boluses after eating, and is sometimes missing altogether.  no cbg record, but states cbg's are mildly low approx 3 times per week, usually in the middle of the night.  Past Medical History  Diagnosis Date  . Chronic kidney disease   . Hypertension   . Hyperlipidemia   . Diabetes mellitus     Pt reports diagnosis in June 2011    Past Surgical History  Procedure Laterality Date  . Kidney transplant  2007  . Thyroid lobectomy    . Renal biopsy Bilateral 2003    Social History   Social History  . Marital Status: Single    Spouse Name: N/A  . Number of Children: N/A  . Years of Education: N/A   Occupational History  . Not on file.   Social History Main Topics  . Smoking status: Never Smoker   . Smokeless tobacco: Never Used  . Alcohol Use: No  . Drug Use: No  . Sexual Activity: Yes    Birth Control/ Protection: None   Other Topics Concern  . Not on file   Social History Narrative    Current Outpatient Prescriptions on File Prior to Visit  Medication Sig Dispense Refill  . acetaminophen (TYLENOL) 500 MG tablet Take 1,000 mg by mouth every 6 (six) hours as needed for mild pain.    .  calcium carbonate (TUMS) 500 MG chewable tablet Chew 2 tablets by mouth 3 (three) times a week. Takes on Monday, wednesdays, and fridays    . glucose blood (BAYER CONTOUR NEXT TEST) test strip Check 8 times per day, and lancets 300 each 12  . hydrOXYzine (ATARAX) 25 MG tablet Take 25 mg by mouth 2 (two) times daily as needed for itching.     . mycophenolate (MYFORTIC) 360 MG TBEC Take 720 mg by mouth 2 (two) times daily.     Marland Kitchen POLY-IRON 150 150 MG capsule Take 150 mg by mouth 2 (two) times daily as needed (supplement).   5  . predniSONE (DELTASONE) 5 MG tablet Take 5 mg by mouth at bedtime.     . promethazine (PHENERGAN) 12.5 MG tablet Take 1 tablet (12.5 mg total) by mouth every 6 (six) hours as needed for nausea or vomiting. 30 tablet 0  . ranitidine (ZANTAC) 150 MG tablet Take 1 tablet (150 mg total) by mouth daily as needed for heartburn. 60 tablet 0  . simvastatin (ZOCOR) 20 MG tablet Take 20 mg by mouth every evening.    . tacrolimus (PROGRAF) 1 MG capsule Take 2 mg by mouth 2 (two) times daily.      No current facility-administered medications on file prior to visit.    Allergies  Allergen Reactions  .  Motrin [Ibuprofen] Shortness Of Breath and Itching    Per pt  . Banana Other (See Comments)    Sick on the stomach  . Diphenhydramine Hcl     REACTION: Stopped breathing in ICU  . Iron Dextran     REACTION: vein irritation  . Shellfish Allergy Hives    Family History  Problem Relation Age of Onset  . Arthritis Mother   . Diabetes Mother   . Hypertension Mother     BP 130/90 mmHg  Pulse 96  Temp(Src) 99 F (37.2 C) (Oral)  Wt 160 lb (72.576 kg)  SpO2 100%  Review of Systems Denies LOC    Objective:   Physical Exam VITAL SIGNS:  See vs page GENERAL: no distress Pulses: dorsalis pedis intact bilat.   MSK: no deformity of the feet CV: no leg edema Skin:  no ulcer on the feet.  normal color and temp on the feet. Neuro: sensation is intact to touch on the feet, but  decreased from normal   Lab Results  Component Value Date   HGBA1C 13.9 11/14/2014    (i discussed with Valerie Reader, RN, CDE)    Assessment & Plan:  DM: the next step is to reduce hypoglycemia, then to adjust according to cbg's.   Patient is advised the following: Patient Instructions  check your blood sugar 4 times a day: before the 3 meals, and at bedtime.  also check if you have symptoms of your blood sugar being too high or too low.  please keep a record of the readings and bring it to your next appointment here.  You can write it on any piece of paper.  please call us sooner if your blood sugar goes below 70, or if you have a lot of readings over 200.  continue basal rate of 2.2 units/hr, 24 hrs per day.   continue mealtime bolus of 1 unit/ 10 grams carbohydrate.   continue correction bolus (which some people call "sensitivity," or "insulin sensitivity ratio," or just "isr") of 1 unit for each 50 by which your glucose exceeds 150 Please come back for a follow-up appointment in 2 weeks.

## 2014-12-26 ENCOUNTER — Encounter (HOSPITAL_COMMUNITY)
Admission: RE | Admit: 2014-12-26 | Discharge: 2014-12-26 | Disposition: A | Discharge status: Home / Self Care | Payer: Medicare Other | Source: Ambulatory Visit | Attending: Nephrology | Admitting: Nephrology

## 2014-12-26 DIAGNOSIS — N183 Chronic kidney disease, stage 3 (moderate): Secondary | ICD-10-CM | POA: Insufficient documentation

## 2014-12-26 DIAGNOSIS — D631 Anemia in chronic kidney disease: Secondary | ICD-10-CM | POA: Diagnosis not present

## 2014-12-26 LAB — POCT HEMOGLOBIN-HEMACUE: Hemoglobin: 11.5 g/dL — ABNORMAL LOW (ref 12.0–15.0)

## 2014-12-26 MED ORDER — EPOETIN ALFA 20000 UNIT/ML IJ SOLN
INTRAMUSCULAR | Status: AC
  Administered 2014-12-26: 20000 [IU] via SUBCUTANEOUS
  Filled 2014-12-26: qty 1

## 2014-12-26 MED ORDER — EPOETIN ALFA 40000 UNIT/ML IJ SOLN: 30000.0000 [IU] | INTRAMUSCULAR | Status: DC

## 2014-12-26 MED ORDER — EPOETIN ALFA 10000 UNIT/ML IJ SOLN
INTRAMUSCULAR | Status: AC
  Administered 2014-12-26: 10000 [IU] via SUBCUTANEOUS
  Filled 2014-12-26: qty 1

## 2015-01-01 ENCOUNTER — Ambulatory Visit: Payer: Medicare Other | Admitting: Endocrinology

## 2015-01-05 ENCOUNTER — Encounter (HOSPITAL_COMMUNITY): Payer: Self-pay | Admitting: *Deleted

## 2015-01-05 ENCOUNTER — Inpatient Hospital Stay (HOSPITAL_COMMUNITY): Payer: Medicare Other

## 2015-01-05 ENCOUNTER — Inpatient Hospital Stay (HOSPITAL_COMMUNITY)
Admission: EM | Admit: 2015-01-05 | Discharge: 2015-01-07 | DRG: 683 | Disposition: A | Discharge status: Home / Self Care | Payer: Medicare Other | Attending: Internal Medicine | Admitting: Internal Medicine

## 2015-01-05 DIAGNOSIS — Z91013 Allergy to seafood: Secondary | ICD-10-CM | POA: Diagnosis not present

## 2015-01-05 DIAGNOSIS — N183 Chronic kidney disease, stage 3 (moderate): Secondary | ICD-10-CM | POA: Diagnosis present

## 2015-01-05 DIAGNOSIS — Z940 Kidney transplant status: Secondary | ICD-10-CM | POA: Insufficient documentation

## 2015-01-05 DIAGNOSIS — Z6825 Body mass index (BMI) 25.0-25.9, adult: Secondary | ICD-10-CM

## 2015-01-05 DIAGNOSIS — E785 Hyperlipidemia, unspecified: Secondary | ICD-10-CM | POA: Diagnosis present

## 2015-01-05 DIAGNOSIS — E119 Type 2 diabetes mellitus without complications: Secondary | ICD-10-CM

## 2015-01-05 DIAGNOSIS — I959 Hypotension, unspecified: Secondary | ICD-10-CM | POA: Diagnosis present

## 2015-01-05 DIAGNOSIS — E1165 Type 2 diabetes mellitus with hyperglycemia: Secondary | ICD-10-CM | POA: Diagnosis present

## 2015-01-05 DIAGNOSIS — R509 Fever, unspecified: Secondary | ICD-10-CM | POA: Diagnosis not present

## 2015-01-05 DIAGNOSIS — I129 Hypertensive chronic kidney disease with stage 1 through stage 4 chronic kidney disease, or unspecified chronic kidney disease: Secondary | ICD-10-CM | POA: Diagnosis not present

## 2015-01-05 DIAGNOSIS — Z888 Allergy status to other drugs, medicaments and biological substances status: Secondary | ICD-10-CM | POA: Diagnosis not present

## 2015-01-05 DIAGNOSIS — E860 Dehydration: Secondary | ICD-10-CM | POA: Diagnosis present

## 2015-01-05 DIAGNOSIS — R05 Cough: Secondary | ICD-10-CM | POA: Diagnosis not present

## 2015-01-05 DIAGNOSIS — D849 Immunodeficiency, unspecified: Secondary | ICD-10-CM | POA: Insufficient documentation

## 2015-01-05 DIAGNOSIS — N189 Chronic kidney disease, unspecified: Secondary | ICD-10-CM | POA: Diagnosis not present

## 2015-01-05 DIAGNOSIS — R1030 Lower abdominal pain, unspecified: Secondary | ICD-10-CM | POA: Diagnosis present

## 2015-01-05 DIAGNOSIS — Z8249 Family history of ischemic heart disease and other diseases of the circulatory system: Secondary | ICD-10-CM | POA: Diagnosis not present

## 2015-01-05 DIAGNOSIS — Z91018 Allergy to other foods: Secondary | ICD-10-CM

## 2015-01-05 DIAGNOSIS — Z886 Allergy status to analgesic agent status: Secondary | ICD-10-CM | POA: Diagnosis not present

## 2015-01-05 DIAGNOSIS — Z7952 Long term (current) use of systemic steroids: Secondary | ICD-10-CM

## 2015-01-05 DIAGNOSIS — D649 Anemia, unspecified: Secondary | ICD-10-CM | POA: Diagnosis present

## 2015-01-05 DIAGNOSIS — E440 Moderate protein-calorie malnutrition: Secondary | ICD-10-CM | POA: Insufficient documentation

## 2015-01-05 DIAGNOSIS — Z79899 Other long term (current) drug therapy: Secondary | ICD-10-CM

## 2015-01-05 DIAGNOSIS — N179 Acute kidney failure, unspecified: Principal | ICD-10-CM | POA: Diagnosis present

## 2015-01-05 DIAGNOSIS — Z833 Family history of diabetes mellitus: Secondary | ICD-10-CM | POA: Diagnosis not present

## 2015-01-05 DIAGNOSIS — R739 Hyperglycemia, unspecified: Secondary | ICD-10-CM | POA: Diagnosis not present

## 2015-01-05 DIAGNOSIS — E871 Hypo-osmolality and hyponatremia: Secondary | ICD-10-CM | POA: Diagnosis not present

## 2015-01-05 DIAGNOSIS — Z794 Long term (current) use of insulin: Secondary | ICD-10-CM | POA: Diagnosis not present

## 2015-01-05 DIAGNOSIS — E1122 Type 2 diabetes mellitus with diabetic chronic kidney disease: Secondary | ICD-10-CM | POA: Diagnosis present

## 2015-01-05 DIAGNOSIS — D899 Disorder involving the immune mechanism, unspecified: Secondary | ICD-10-CM

## 2015-01-05 DIAGNOSIS — Z87440 Personal history of urinary (tract) infections: Secondary | ICD-10-CM

## 2015-01-05 DIAGNOSIS — Z9641 Presence of insulin pump (external) (internal): Secondary | ICD-10-CM | POA: Diagnosis present

## 2015-01-05 DIAGNOSIS — N186 End stage renal disease: Secondary | ICD-10-CM | POA: Diagnosis present

## 2015-01-05 LAB — PREGNANCY, URINE: PREG TEST UR: NEGATIVE

## 2015-01-05 LAB — BASIC METABOLIC PANEL
ANION GAP: 12 (ref 5–15)
Anion gap: 9 (ref 5–15)
BUN: 51 mg/dL — ABNORMAL HIGH (ref 6–20)
BUN: 49 mg/dL — AB (ref 6–20)
CALCIUM: 9 mg/dL (ref 8.9–10.3)
CHLORIDE: 102 mmol/L (ref 101–111)
CO2: 20 mmol/L — ABNORMAL LOW (ref 22–32)
CO2: 20 mmol/L — AB (ref 22–32)
CREATININE: 3.18 mg/dL — AB (ref 0.44–1.00)
Calcium: 8.2 mg/dL — ABNORMAL LOW (ref 8.9–10.3)
Chloride: 94 mmol/L — ABNORMAL LOW (ref 101–111)
Creatinine, Ser: 2.71 mg/dL — ABNORMAL HIGH (ref 0.44–1.00)
GFR calc Af Amer: 21 mL/min — ABNORMAL LOW (ref >60)
GFR calc non Af Amer: 18 mL/min — ABNORMAL LOW (ref >60)
GFR calc non Af Amer: 22 mL/min — ABNORMAL LOW (ref >60)
GFR, EST AFRICAN AMERICAN: 26 mL/min — AB (ref >60)
Glucose, Bld: 607 mg/dL (ref 65–99)
Glucose, Bld: 373 mg/dL — ABNORMAL HIGH (ref 65–99)
POTASSIUM: 3.6 mmol/L (ref 3.5–5.1)
Potassium: 4.2 mmol/L (ref 3.5–5.1)
SODIUM: 131 mmol/L — AB (ref 135–145)
Sodium: 126 mmol/L — ABNORMAL LOW (ref 135–145)

## 2015-01-05 LAB — CBC
HCT: 31 % — ABNORMAL LOW (ref 36.0–46.0)
Hemoglobin: 10.1 g/dL — ABNORMAL LOW (ref 12.0–15.0)
MCH: 28.9 pg (ref 26.0–34.0)
MCHC: 32.6 g/dL (ref 30.0–36.0)
MCV: 88.6 fL (ref 78.0–100.0)
PLATELETS: 295 10*3/uL (ref 150–400)
RBC: 3.5 MIL/uL — ABNORMAL LOW (ref 3.87–5.11)
RDW: 13.6 % (ref 11.5–15.5)
WBC: 11.8 10*3/uL — AB (ref 4.0–10.5)

## 2015-01-05 LAB — GLUCOSE, CAPILLARY
GLUCOSE-CAPILLARY: 363 mg/dL — AB (ref 65–99)
GLUCOSE-CAPILLARY: 226 mg/dL — AB (ref 65–99)
Glucose-Capillary: 566 mg/dL (ref 65–99)
Glucose-Capillary: 392 mg/dL — ABNORMAL HIGH (ref 65–99)
Glucose-Capillary: 319 mg/dL — ABNORMAL HIGH (ref 65–99)
Glucose-Capillary: 251 mg/dL — ABNORMAL HIGH (ref 65–99)
Glucose-Capillary: 178 mg/dL — ABNORMAL HIGH (ref 65–99)

## 2015-01-05 LAB — URINE MICROSCOPIC-ADD ON

## 2015-01-05 LAB — URINALYSIS, ROUTINE W REFLEX MICROSCOPIC
BILIRUBIN URINE: NEGATIVE
Glucose, UA: >1000 mg/dL — AB
KETONES UR: NEGATIVE mg/dL
NITRITE: NEGATIVE
PROTEIN: 30 mg/dL — AB
Specific Gravity, Urine: 1.017 (ref 1.005–1.030)
UROBILINOGEN UA: 0.2 mg/dL (ref 0.0–1.0)
pH: 5 (ref 5.0–8.0)

## 2015-01-05 LAB — MRSA PCR SCREENING: MRSA BY PCR: NEGATIVE

## 2015-01-05 LAB — RAPID STREP SCREEN (MED CTR MEBANE ONLY): Streptococcus, Group A Screen (Direct): NEGATIVE

## 2015-01-05 LAB — CBG MONITORING, ED: GLUCOSE-CAPILLARY: 441 mg/dL — AB (ref 65–99)

## 2015-01-05 MED ORDER — SODIUM CHLORIDE 0.9 % IV BOLUS (SEPSIS)
1000.0000 mL | Freq: Once | INTRAVENOUS | Status: AC
  Administered 2015-01-05: 1000 mL via INTRAVENOUS

## 2015-01-05 MED ORDER — INSULIN REGULAR BOLUS VIA INFUSION
0.0000 [IU] | Freq: Three times a day (TID) | INTRAVENOUS | Status: DC
  Administered 2015-01-06: 7.3 [IU] via INTRAVENOUS
  Filled 2015-01-05: qty 10

## 2015-01-05 MED ORDER — TACROLIMUS 1 MG PO CAPS
2.0000 mg | ORAL_CAPSULE | Freq: Two times a day (BID) | ORAL | Status: DC
  Administered 2015-01-05 – 2015-01-07 (×4): 2 mg via ORAL
  Filled 2015-01-05 (×4): qty 2

## 2015-01-05 MED ORDER — SODIUM CHLORIDE 0.9 % IV SOLN: INTRAVENOUS | Status: DC

## 2015-01-05 MED ORDER — SODIUM CHLORIDE 0.9 % IV SOLN
INTRAVENOUS | Status: DC
  Administered 2015-01-05: 3.3 [IU]/h via INTRAVENOUS
  Filled 2015-01-05: qty 2.5

## 2015-01-05 MED ORDER — MYCOPHENOLATE SODIUM 180 MG PO TBEC
720.0000 mg | DELAYED_RELEASE_TABLET | Freq: Two times a day (BID) | ORAL | Status: DC
  Administered 2015-01-05 – 2015-01-07 (×4): 720 mg via ORAL
  Filled 2015-01-05 (×4): qty 4

## 2015-01-05 MED ORDER — SODIUM CHLORIDE 0.9 % IV SOLN
INTRAVENOUS | Status: DC
  Administered 2015-01-05: 18:00:00 via INTRAVENOUS

## 2015-01-05 MED ORDER — SIMVASTATIN 20 MG PO TABS
20.0000 mg | ORAL_TABLET | Freq: Every evening | ORAL | Status: DC
  Administered 2015-01-05 – 2015-01-06 (×2): 20 mg via ORAL
  Filled 2015-01-05 (×3): qty 1

## 2015-01-05 MED ORDER — DEXTROSE-NACL 5-0.45 % IV SOLN: INTRAVENOUS | Status: DC

## 2015-01-05 MED ORDER — ONDANSETRON HCL 4 MG/2ML IJ SOLN
4.0000 mg | Freq: Three times a day (TID) | INTRAMUSCULAR | Status: AC | PRN
  Administered 2015-01-06: 4 mg via INTRAVENOUS
  Filled 2015-01-05: qty 2

## 2015-01-05 MED ORDER — HYDROXYZINE HCL 25 MG PO TABS: 25.0000 mg | ORAL_TABLET | Freq: Two times a day (BID) | ORAL | Status: DC | PRN

## 2015-01-05 MED ORDER — HYDROCORTISONE NA SUCCINATE PF 100 MG IJ SOLR
50.0000 mg | Freq: Three times a day (TID) | INTRAMUSCULAR | Status: DC
  Administered 2015-01-05 – 2015-01-06 (×2): 50 mg via INTRAVENOUS
  Filled 2015-01-05 (×3): qty 1

## 2015-01-05 MED ORDER — FAMOTIDINE 20 MG PO TABS
20.0000 mg | ORAL_TABLET | Freq: Every day | ORAL | Status: DC | PRN
  Administered 2015-01-06: 20 mg via ORAL
  Filled 2015-01-05 (×2): qty 1

## 2015-01-05 MED ORDER — DEXTROSE-NACL 5-0.45 % IV SOLN
INTRAVENOUS | Status: DC
  Administered 2015-01-05 – 2015-01-06 (×2): via INTRAVENOUS

## 2015-01-05 MED ORDER — SODIUM CHLORIDE 0.9 % IV SOLN: INTRAVENOUS | Status: AC

## 2015-01-05 MED ORDER — ONDANSETRON HCL 4 MG/2ML IJ SOLN
4.0000 mg | Freq: Once | INTRAMUSCULAR | Status: AC
  Administered 2015-01-05: 4 mg via INTRAVENOUS
  Filled 2015-01-05: qty 2

## 2015-01-05 MED ORDER — DEXTROSE 50 % IV SOLN: 25.0000 mL | INTRAVENOUS | Status: DC | PRN

## 2015-01-05 NOTE — Progress Notes (Signed)
Received pt from ED for further evaluation of dehydration and hyperglycemia. Pt reports three week history of cough, body aches and fever. Pt states she received the flu shot and has been coughing since with body aches.. Today she felt week and came to the ED. Pt reported vomiting once in the ED today, soft stools for several weeks. Last episode of fever was on Friday. Pt is a kidney transplant right in 2007 and a IDDM on insulin pump.

## 2015-01-05 NOTE — ED Provider Notes (Signed)
CSN: [id number]     Arrival date & time 01/05/15  1336 History   First MD Initiated Contact with Patient 01/05/15 1418     Chief Complaint  Patient presents with  . Weakness  . Nasal Congestion     (Consider location/radiation/quality/duration/timing/severity/associated sxs/prior Treatment) HPI Comments: Patient is a 28 year old female with past medical history of DM-1, renal transplant (2007) who presents to the ED with complaint of weakness, lightheadedness, decreased appetite, onset 2 days. Patient reports she has been feeling dehydrated for the past few days. She notes she has been having lower abdominal cramping, no aggravating or alleviating factors. Endorses fever, cough, nausea, dark urine. Patient states that she saw her urologist in August, was given Keflex Rx and advised to take the antibiotics as needed for urinary symptoms. She notes she has been taking Keflex for the past 2 days. Patient also reports her BGLs at home have been 400-500.  She reports sick contacts at work.    Past Medical History  Diagnosis Date  . Chronic kidney disease   . Hypertension   . Hyperlipidemia   . Diabetes mellitus     Pt reports diagnosis in June 2011   Past Surgical History  Procedure Laterality Date  . Kidney transplant  2007  . Thyroid lobectomy    . Renal biopsy Bilateral 2003   Family History  Problem Relation Age of Onset  . Arthritis Mother   . Diabetes Mother   . Hypertension Mother    Social History  Substance Use Topics  . Smoking status: Never Smoker   . Smokeless tobacco: Never Used  . Alcohol Use: No   OB History    No data available     Review of Systems  Constitutional: Positive for fever.  Respiratory: Positive for cough.   Gastrointestinal: Positive for nausea and abdominal pain.  Genitourinary:       Dark urine  Neurological: Positive for weakness and light-headedness.  All other systems reviewed and are negative.     Allergies  Motrin; Banana;  Diphenhydramine hcl; Iron dextran; and Shellfish allergy  Home Medications   Prior to Admission medications   Medication Sig Start Date End Date Taking? Authorizing Provider  acetaminophen (TYLENOL) 500 MG tablet Take 1,000 mg by mouth every 6 (six) hours as needed for mild pain.   Yes Historical Provider, MD  calcium carbonate (TUMS) 500 MG chewable tablet Chew 2 tablets by mouth 3 (three) times a week. Takes on Monday, wednesdays, and fridays   Yes Historical Provider, MD  glucose blood (BAYER CONTOUR NEXT TEST) test strip Check 8 times per day, and lancets 11/28/14  Yes Renato Shin, MD  hydrOXYzine (ATARAX) 25 MG tablet Take 25 mg by mouth 2 (two) times daily as needed for itching.    Yes Historical Provider, MD  mycophenolate (MYFORTIC) 360 MG TBEC Take 720 mg by mouth 2 (two) times daily.    Yes Historical Provider, MD  NOVOLOG 100 UNIT/ML injection For use in pump, total of 80 units per day. 12/25/14  Yes Renato Shin, MD  POLY-IRON 150 150 MG capsule Take 150 mg by mouth 2 (two) times daily as needed (supplement).  03/20/14  Yes Historical Provider, MD  predniSONE (DELTASONE) 5 MG tablet Take 5 mg by mouth at bedtime.    Yes Historical Provider, MD  ranitidine (ZANTAC) 150 MG tablet Take 1 tablet (150 mg total) by mouth daily as needed for heartburn. 06/25/14  Yes Kinnie Feil, MD  simvastatin (ZOCOR) 20 MG  tablet Take 20 mg by mouth every evening.   Yes Historical Provider, MD  tacrolimus (PROGRAF) 1 MG capsule Take 2 mg by mouth 2 (two) times daily.    Yes Historical Provider, MD  promethazine (PHENERGAN) 12.5 MG tablet Take 1 tablet (12.5 mg total) by mouth every 6 (six) hours as needed for nausea or vomiting. Patient not taking: Reported on 01/05/2015 10/01/14   Ripudeep K [name], MD   BP 101/66 mmHg  Pulse 109  Temp(Src) 98.2 F (36.8 C) (Oral)  Resp 18  SpO2 98% Physical Exam  Constitutional: She is oriented to person, place, and time. She appears well-developed and  well-nourished. No distress.  HENT:  Head: Normocephalic and atraumatic.  Nose: Nose normal. Right sinus exhibits no maxillary sinus tenderness and no frontal sinus tenderness. Left sinus exhibits no maxillary sinus tenderness and no frontal sinus tenderness.  Mouth/Throat: Uvula is midline, oropharynx is clear and moist and mucous membranes are normal. No oropharyngeal exudate.  Eyes: Conjunctivae and EOM are normal. Pupils are equal, round, and reactive to light. Right eye exhibits no discharge. Left eye exhibits no discharge. No scleral icterus.  Neck: Normal range of motion. Neck supple.  Cardiovascular: Normal rate, regular rhythm, normal heart sounds and intact distal pulses.   Pulmonary/Chest: Effort normal and breath sounds normal. No respiratory distress. She has no wheezes. She has no rales. She exhibits no tenderness.  Abdominal: Soft. Bowel sounds are normal. She exhibits no distension and no mass. There is tenderness (RLQ). There is no rebound and no guarding.  Musculoskeletal: Normal range of motion. She exhibits no edema.  Lymphadenopathy:    She has no cervical adenopathy.  Neurological: She is alert and oriented to person, place, and time.  Skin: Skin is warm and dry. She is not diaphoretic.  Nursing note and vitals reviewed.   ED Course  Procedures (including critical care time) Labs Review Labs Reviewed  CBC  BASIC METABOLIC PANEL  URINALYSIS, ROUTINE W REFLEX MICROSCOPIC (NOT AT [facility])    Imaging Review No results found. I have personally reviewed and evaluated these images and lab results as part of my medical decision-making.  Filed Vitals:   01/05/15 1606  BP: 102/65  Pulse: 102  Temp:   Resp: 18     MDM   Final diagnoses:  Acute renal failure, unspecified acute renal failure type ([place])  Dehydration    Pt presents with weakness, dec appetite, lightheaded and fever for 2 days. PMH includes DM-1 and kidney transplant (2007). BGLs at home have been  400-500. Also endorses cough, reports sick contacts at work. Afebrile. Exam revealed RLQ tenderness, lungs CTAB, remaining exam benign. Pt given zofran. WBC 11.8. Glucose 607. BUN 51, Cr 3.18. Orders placed for IVF and insulin drip. Consulted hospitalist for acute renal failure and dehydration, agree to admission, requested orders placed for CXR, flu PCR, blood cultures and IVF. Orders placed for tele bed. Discussed results and plan for admission with pt.     Chesley Noon [place], [place] 01/05/15 [address], MD 01/09/15 [id number]

## 2015-01-05 NOTE — ED Notes (Signed)
Pt aware Urine specimen is needed, cannot use restroom at this time

## 2015-01-05 NOTE — H&P (Signed)
History and Physical  [name] [id number] DOB: [birth year] DOA: 01/05/2015  Referring physician: EDP PCP: Elyn Peers, MD   Chief Complaint: dehydration, fever, cough  HPI: [name] is a 29 y.o. female  With h/o renal transplant in 2007 on chronic prednisone/myfortic/prograf, h/o insulin dependent diabetes using insulin pump, presented to [facility] ED due to d/o dehydration, weakness, she also reported fever of 102 on Friday, reported cough, minimal productive, running nose, sore throat since got flu shot early October,  She was given amoxcillin for possible bronchitis, she also used claritin, running nose has stopped, but she continued to cough, she also noticed right lower abdominal pain, dark urine, frequent loose stool 3-4 time a day since early October. Reported last treated for uti in august this year. Reported vomited once in the ED. Denies chest pain, no sob, no extremity edema, no skin rash. 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   Review of Systems:  Detail per HPI, Review of systems are otherwise negative  Past Medical History  Diagnosis Date  . Chronic kidney disease   . Hypertension   . Hyperlipidemia   . Diabetes mellitus     Pt reports diagnosis in June 2011   Past Surgical History  Procedure Laterality Date  . Kidney transplant  2007  . Thyroid lobectomy    . Renal biopsy Bilateral 2003   Social History:  reports that she has never smoked. She has never used smokeless tobacco. She reports that she does not drink alcohol or use illicit drugs. Patient lives at home & is able to participate in activities of daily living independently   Allergies  Allergen Reactions  . Motrin [Ibuprofen] Shortness Of Breath and Itching    Per pt  . Banana Other (See Comments)    Sick on the stomach  . Diphenhydramine Hcl     REACTION: Stopped breathing in ICU  . Iron Dextran     REACTION: vein irritation  . Shellfish Allergy Hives    Family  History  Problem Relation Age of Onset  . Arthritis Mother   . Diabetes Mother   . Hypertension Mother       Prior to Admission medications   Medication Sig Start Date End Date Taking? Authorizing Provider  acetaminophen (TYLENOL) 500 MG tablet Take 1,000 mg by mouth every 6 (six) hours as needed for mild pain.   Yes Historical Provider, MD  calcium carbonate (TUMS) 500 MG chewable tablet Chew 2 tablets by mouth 3 (three) times a week. Takes on Monday, wednesdays, and fridays   Yes Historical Provider, MD  glucose blood (BAYER CONTOUR NEXT TEST) test strip Check 8 times per day, and lancets 11/28/14  Yes Renato Shin, MD  hydrOXYzine (ATARAX) 25 MG tablet Take 25 mg by mouth 2 (two) times daily as needed for itching.    Yes Historical Provider, MD  mycophenolate (MYFORTIC) 360 MG TBEC Take 720 mg by mouth 2 (two) times daily.    Yes Historical Provider, MD  NOVOLOG 100 UNIT/ML injection For use in pump, total of 80 units per day. 12/25/14  Yes Renato Shin, MD  POLY-IRON 150 150 MG capsule Take 150 mg by mouth 2 (two) times daily as needed (supplement).  03/20/14  Yes Historical Provider, MD  predniSONE (DELTASONE) 5 MG tablet Take 5 mg by mouth at bedtime.    Yes Historical Provider, MD  ranitidine (ZANTAC) 150 MG tablet Take 1 tablet (150 mg total) by mouth daily as  needed for heartburn. 06/25/14  Yes Kinnie Feil, MD  simvastatin (ZOCOR) 20 MG tablet Take 20 mg by mouth every evening.   Yes Historical Provider, MD  tacrolimus (PROGRAF) 1 MG capsule Take 2 mg by mouth 2 (two) times daily.    Yes Historical Provider, MD  promethazine (PHENERGAN) 12.5 MG tablet Take 1 tablet (12.5 mg total) by mouth every 6 (six) hours as needed for nausea or vomiting. Patient not taking: Reported on 01/05/2015 10/01/14   Ripudeep Krystal Eaton, MD    Physical Exam: BP 114/73 mmHg  Pulse 79  Temp(Src) 98.3 F (36.8 C) (Oral)  Resp 16  SpO2 99%  General:  aaox3 Eyes: PERRL ENT: unremarkable Neck: supple, no  JVD Cardiovascular: RRR Respiratory: CTABL Abdomen: mild right lower abdominal pain, no guarding, no rebound, soft/ND, positive bowel sounds. Old healed surgical scar. Skin: no rash Musculoskeletal:  No edema Psychiatric: calm/cooperative Neurologic: no focal findings            Labs on Admission:  Basic Metabolic Panel:  Recent Labs Lab 01/05/15 1451  NA 126*  K 4.2  CL 94*  CO2 20*  GLUCOSE 607*  BUN 51*  CREATININE 3.18*  CALCIUM 9.0   Liver Function Tests: No results for input(s): AST, ALT, ALKPHOS, BILITOT, PROT, ALBUMIN in the last 168 hours. No results for input(s): LIPASE, AMYLASE in the last 168 hours. No results for input(s): AMMONIA in the last 168 hours. CBC:  Recent Labs Lab 01/05/15 1451  WBC 11.8*  HGB 10.1*  HCT 31.0*  MCV 88.6  PLT 295   Cardiac Enzymes: No results for input(s): CKTOTAL, CKMB, CKMBINDEX, TROPONINI in the last 168 hours.  BNP (last 3 results) No results for input(s): BNP in the last 8760 hours.  ProBNP (last 3 results) No results for input(s): PROBNP in the last 8760 hours.  CBG:  Recent Labs Lab 01/05/15 1407 01/05/15 1659 01/05/15 1742  GLUCAP 566* 441* 392*    Radiological Exams on Admission: Dg Chest 2 View  01/05/2015  CLINICAL DATA:  Cough.  Fever.  Renal transplant. EXAM: CHEST  2 VIEW COMPARISON:  09/30/2014 FINDINGS: The heart size and mediastinal contours are within normal limits. Both lungs are clear. The visualized skeletal structures are unremarkable. IMPRESSION: Normal exam. Electronically Signed   By: Lorriane Shire M.D.   On: 01/05/2015 17:17    Assessment/Plan Present on Admission:  . Acute renal failure ([place]) . Renal failure (ARF), acute on chronic (HCC)  Fever: in immunosuppressed individual.  cxr unremarkable, blood culture done in the ED, flu pcr, rapid strep test, ua, cdiff, bk virus ordered.   Cough: cxr unremarkable, flu prc pending  Diarrhea: reported 3-4 time per day, since early  October, c diff pending  Right lower abdominal pain, (reported similar symptom from her last uti in august this year) check ua, renal US.  ARF on ckd III,  ua pending, check renal US, on ivf , consider abx if ua suspect for infection.  Hyperglycemia, with normal gap. Insulin drip started from the ED, will keep on insulin drip for now, due to started stress dose steroids.  Hyponatremia: likely combination of dehydration and hyperglycemia, expect normalization with ivf and correct hyperglycemia.  Insulin dependent diabetes: patient refer not use her own insulin pump. Diabetic RN consulted.  s/p renal transplant on prednisone/myfortic/prograf: check drug levels, check bk virus. Nephrology consulted.  DVT prophylaxis: scd's  Consultants: nephrology notified, will see patient tomorrow  Code Status: full   Family Communication:  Patient  Disposition Plan: admit to med tele  Time spent: 33mins  Otha Monical MD, PhD [facility] Pager [phone] If 7PM-7AM, please contact night-coverage at [email or web address], password [facility]

## 2015-01-05 NOTE — ED Notes (Signed)
Patient c/o weakness, feeling light headed and loss of appetite x2-3 days.  Patient denies N/V/D, but endorses fever 102-103 on Friday (10/21).  Patient is a renal transplant patient and type 1 DM.  Patient states her blood glucose have been high at home - as high as 400-500.  Patient also has nasal congestion/cough and her PCP placed her on abx last week and finished most of them - they made her nauseated.

## 2015-01-05 NOTE — Consult Note (Addendum)
Renal Service Consult Note [facility]  [name] 01/05/2015 Roney Jaffe D Requesting Physician:  Dr Erlinda Hong  Reason for Consult:  Acute/ chronic renal failure, renal Tx HPI: The patient is a 29 y.o. 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  Has been feeling poorly for several days after the flu shot with fevers (102 peak at home),chills, runny now and dry cough. Felt "dehydrated" so came to ED and creat was 3.1 and pt admitted. Patient said her BS's were running high as well.  BP was low 100's, UA showed 3-5 wbc and 0-2 rbc's.  Temp here 99, creat down today to 2.7 with IVF"s.  Making urine. Denies abd pain /n/v/d , no CP or SOB.  Also has hx of recurrent pyelo f/b urology.  Transplant was done at [facility] in [place], sees them every 3 years and f/b Dr Lorrene Reid locally   Past Medical History  Past Medical History  Diagnosis Date  . Chronic kidney disease   . Hypertension   . Hyperlipidemia   . Diabetes mellitus     Pt reports diagnosis in June 2011   Past Surgical History  Past Surgical History  Procedure Laterality Date  . Kidney transplant  2007  . Thyroid lobectomy    . Renal biopsy Bilateral 2003   Family History  Family History  Problem Relation Age of Onset  . Arthritis Mother   . Diabetes Mother   . Hypertension Mother    Social History  reports that she has never smoked. She has never used smokeless tobacco. She reports that she does not drink alcohol or use illicit drugs. Allergies  Allergies  Allergen Reactions  . Motrin [Ibuprofen] Shortness Of Breath and Itching    Per pt  . Banana Other (See Comments)    Sick on the stomach  . Diphenhydramine Hcl     REACTION: Stopped breathing in ICU  . Iron Dextran     REACTION: vein irritation  . Shellfish Allergy Hives   Home medications Prior to Admission medications   Medication Sig Start Date End Date Taking? Authorizing  Provider  acetaminophen (TYLENOL) 500 MG tablet Take 1,000 mg by mouth every 6 (six) hours as needed for mild pain.   Yes Historical Provider, MD  calcium carbonate (TUMS) 500 MG chewable tablet Chew 2 tablets by mouth 3 (three) times a week. Takes on Monday, wednesdays, and fridays   Yes Historical Provider, MD  glucose blood (BAYER CONTOUR NEXT TEST) test strip Check 8 times per day, and lancets 11/28/14  Yes Renato Shin, MD  hydrOXYzine (ATARAX) 25 MG tablet Take 25 mg by mouth 2 (two) times daily as needed for itching.    Yes Historical Provider, MD  mycophenolate (MYFORTIC) 360 MG TBEC Take 720 mg by mouth 2 (two) times daily.    Yes Historical Provider, MD  NOVOLOG 100 UNIT/ML injection For use in pump, total of 80 units per day. 12/25/14  Yes Renato Shin, MD  POLY-IRON 150 150 MG capsule Take 150 mg by mouth 2 (two) times daily as needed (supplement).  03/20/14  Yes Historical Provider, MD  predniSONE (DELTASONE) 5 MG tablet Take 5 mg by mouth at bedtime.    Yes Historical Provider, MD  ranitidine (ZANTAC) 150 MG tablet Take 1 tablet (150 mg total) by mouth daily as needed for heartburn. 06/25/14  Yes Kinnie Feil, MD  simvastatin (ZOCOR) 20 MG tablet  Take 20 mg by mouth every evening.   Yes Historical Provider, MD  tacrolimus (PROGRAF) 1 MG capsule Take 2 mg by mouth 2 (two) times daily.    Yes Historical Provider, MD  promethazine (PHENERGAN) 12.5 MG tablet Take 1 tablet (12.5 mg total) by mouth every 6 (six) hours as needed for nausea or vomiting. Patient not taking: Reported on 01/05/2015 10/01/14   Ripudeep Krystal Eaton, MD   Liver Function Tests No results for input(s): AST, ALT, ALKPHOS, BILITOT, PROT, ALBUMIN in the last 168 hours. No results for input(s): LIPASE, AMYLASE in the last 168 hours. CBC  Recent Labs Lab 01/05/15 1451  WBC 11.8*  HGB 10.1*  HCT 31.0*  MCV 88.6  PLT [id number]   Basic Metabolic Panel  Recent Labs Lab 01/05/15 1451 01/05/15 1910  NA 126* 131*  K 4.2 3.6   CL 94* 102  CO2 20* 20*  GLUCOSE 607* 373*  BUN 51* 49*  CREATININE 3.18* 2.71*  CALCIUM 9.0 8.2*    Filed Vitals:   01/05/15 1606 01/05/15 1733 01/05/15 1900 01/05/15 1954  BP: 102/65 114/73  118/75  Pulse: 102 79  102  Temp:  98.3 F (36.8 C)  99.1 F (37.3 C)  TempSrc:  Oral  Oral  Resp: 18 16  18   Height:   5\' 6"  (1.676 m)   Weight:   70.761 kg (156 lb)   SpO2: 99% 99%  99%   Exam Alert, dry cough and some nasal congestion No rash, cyanosis or gangrene Sclera anicteric, throat clear and slightly moist No jvd Chest clear bilat RRR no MRG ABd soft ntnd no ascites +bs RLQ transplant nontender  No LE or UE edema Neuro is alert nf ox 3  UA 3-6 wbc , 0-2 rbc. Cloudy, neg protein, rare epi, rare bact Na 131 K 3.6  Cl 102  CO2 20  BUN 49  Creat 2.73 (down from 3.18) WBC 11k  Hb 10 plt 295  Assessment: 1. Acute on CKD / renal transplant - prob due to vol depletion/ hypotension. Less likely rejection/ transplant infection. Creat improved some w fluids. Baseline creat 1.4- 1.7. Cont supportive care. 2. Fevers/ myalgias/ URI/ cough - after flu shot, probable secondary to vaccination (although CDC of course denies that it can happen) 3. Renal Tx - cont Tx meds 4. DM2 post-Tx - takes insulin pump 5. Hx recurrent UTI/ transplant pyelo - f/b urology. Doesn't look infected at this time.    Plan- continue IVF"s, continue transplant meds  Kelly Splinter MD [facility] pager [phone]    cell [phone] 01/05/2015, 9:08 PM

## 2015-01-06 ENCOUNTER — Ambulatory Visit: Payer: Medicare Other | Admitting: Endocrinology

## 2015-01-06 ENCOUNTER — Inpatient Hospital Stay (HOSPITAL_COMMUNITY): Payer: Medicare Other

## 2015-01-06 DIAGNOSIS — E440 Moderate protein-calorie malnutrition: Secondary | ICD-10-CM | POA: Insufficient documentation

## 2015-01-06 LAB — GLUCOSE, CAPILLARY
GLUCOSE-CAPILLARY: 152 mg/dL — AB (ref 65–99)
GLUCOSE-CAPILLARY: 178 mg/dL — AB (ref 65–99)
GLUCOSE-CAPILLARY: 190 mg/dL — AB (ref 65–99)
GLUCOSE-CAPILLARY: 219 mg/dL — AB (ref 65–99)
GLUCOSE-CAPILLARY: 223 mg/dL — AB (ref 65–99)
GLUCOSE-CAPILLARY: 268 mg/dL — AB (ref 65–99)
GLUCOSE-CAPILLARY: 92 mg/dL (ref 65–99)
Glucose-Capillary: 223 mg/dL — ABNORMAL HIGH (ref 65–99)
Glucose-Capillary: 223 mg/dL — ABNORMAL HIGH (ref 65–99)
Glucose-Capillary: 149 mg/dL — ABNORMAL HIGH (ref 65–99)
Glucose-Capillary: 156 mg/dL — ABNORMAL HIGH (ref 65–99)
Glucose-Capillary: 150 mg/dL — ABNORMAL HIGH (ref 65–99)
Glucose-Capillary: 188 mg/dL — ABNORMAL HIGH (ref 65–99)
Glucose-Capillary: 227 mg/dL — ABNORMAL HIGH (ref 65–99)

## 2015-01-06 LAB — COMPREHENSIVE METABOLIC PANEL
ALBUMIN: 3.1 g/dL — AB (ref 3.5–5.0)
ALT: 15 U/L (ref 14–54)
ANION GAP: 9 (ref 5–15)
AST: 22 U/L (ref 15–41)
Alkaline Phosphatase: 68 U/L (ref 38–126)
BUN: 48 mg/dL — ABNORMAL HIGH (ref 6–20)
CO2: 22 mmol/L (ref 22–32)
Calcium: 8.7 mg/dL — ABNORMAL LOW (ref 8.9–10.3)
Chloride: 106 mmol/L (ref 101–111)
Creatinine, Ser: 2.47 mg/dL — ABNORMAL HIGH (ref 0.44–1.00)
GFR calc Af Amer: 29 mL/min — ABNORMAL LOW (ref >60)
GFR calc non Af Amer: 25 mL/min — ABNORMAL LOW (ref >60)
GLUCOSE: 171 mg/dL — AB (ref 65–99)
POTASSIUM: 4.2 mmol/L (ref 3.5–5.1)
SODIUM: 137 mmol/L (ref 135–145)
Total Bilirubin: 0.5 mg/dL (ref 0.3–1.2)
Total Protein: 7.6 g/dL (ref 6.5–8.1)

## 2015-01-06 LAB — CBC WITH DIFFERENTIAL/PLATELET
BASOS PCT: 0 %
Basophils Absolute: 0 10*3/uL (ref 0.0–0.1)
Eosinophils Absolute: 0 10*3/uL (ref 0.0–0.7)
Eosinophils Relative: 1 %
HEMATOCRIT: 28.8 % — AB (ref 36.0–46.0)
HEMOGLOBIN: 9.7 g/dL — AB (ref 12.0–15.0)
LYMPHS ABS: 1.2 10*3/uL (ref 0.7–4.0)
LYMPHS PCT: 14 %
MCH: 29.6 pg (ref 26.0–34.0)
MCHC: 33.7 g/dL (ref 30.0–36.0)
MCV: 87.8 fL (ref 78.0–100.0)
MONOS PCT: 6 %
Monocytes Absolute: 0.5 10*3/uL (ref 0.1–1.0)
NEUTROS ABS: 6.6 10*3/uL (ref 1.7–7.7)
NEUTROS PCT: 79 %
Platelets: 297 10*3/uL (ref 150–400)
RBC: 3.28 MIL/uL — ABNORMAL LOW (ref 3.87–5.11)
RDW: 13.6 % (ref 11.5–15.5)
WBC: 8.3 10*3/uL (ref 4.0–10.5)

## 2015-01-06 LAB — INFLUENZA PANEL BY PCR (TYPE A & B)
H1N1FLUPCR: NOT DETECTED
INFLBPCR: NEGATIVE
Influenza A By PCR: NEGATIVE

## 2015-01-06 LAB — STREP PNEUMONIAE URINARY ANTIGEN: STREP PNEUMO URINARY ANTIGEN: NEGATIVE

## 2015-01-06 LAB — LACTIC ACID, PLASMA: LACTIC ACID, VENOUS: 0.9 mmol/L (ref 0.5–2.0)

## 2015-01-06 LAB — TSH: TSH: 0.292 u[IU]/mL — ABNORMAL LOW (ref 0.350–4.500)

## 2015-01-06 LAB — CK: CK TOTAL: 25 U/L — AB (ref 38–234)

## 2015-01-06 MED ORDER — ACETAMINOPHEN 500 MG PO TABS: 1000.0000 mg | ORAL_TABLET | Freq: Four times a day (QID) | ORAL | Status: DC | PRN

## 2015-01-06 MED ORDER — PREDNISONE 5 MG PO TABS
5.0000 mg | ORAL_TABLET | Freq: Every day | ORAL | Status: DC
  Administered 2015-01-06: 5 mg via ORAL
  Filled 2015-01-06: qty 1

## 2015-01-06 MED ORDER — GUAIFENESIN ER 600 MG PO TB12
600.0000 mg | ORAL_TABLET | Freq: Two times a day (BID) | ORAL | Status: DC
Start: 2015-01-06 — End: 2015-01-07
  Administered 2015-01-06 – 2015-01-07 (×2): 600 mg via ORAL
  Filled 2015-01-06 (×2): qty 1

## 2015-01-06 MED ORDER — INSULIN ASPART 100 UNIT/ML ~~LOC~~ SOLN
0.0000 [IU] | Freq: Three times a day (TID) | SUBCUTANEOUS | Status: DC
Start: 2015-01-06 — End: 2015-01-07
  Administered 2015-01-06: 3 [IU] via SUBCUTANEOUS
  Administered 2015-01-06 – 2015-01-07 (×2): 5 [IU] via SUBCUTANEOUS

## 2015-01-06 MED ORDER — INSULIN GLARGINE 100 UNIT/ML ~~LOC~~ SOLN
40.0000 [IU] | Freq: Two times a day (BID) | SUBCUTANEOUS | Status: DC
  Administered 2015-01-06 – 2015-01-07 (×3): 40 [IU] via SUBCUTANEOUS
  Filled 2015-01-06 (×3): qty 0.4

## 2015-01-06 MED ORDER — SODIUM CHLORIDE 0.9 % IV SOLN
INTRAVENOUS | Status: DC
  Administered 2015-01-06: 14:00:00 via INTRAVENOUS

## 2015-01-06 NOTE — Progress Notes (Signed)
Initial Nutrition Assessment  DOCUMENTATION CODES:   Non-severe (moderate) malnutrition in context of acute illness/injury  INTERVENTION:  - Will provide with nutrition education concerning Type 1 DM - Continue Carb Modified diet - RD will continue to monitor for needs  NUTRITION DIAGNOSIS:   Inadequate oral intake related to acute illness, nausea as evidenced by per patient/family report  GOAL:   Patient will meet greater than or equal to 90% of their needs  MONITOR:   PO intake, Weight trends, Labs, I & O's  REASON FOR ASSESSMENT:   Malnutrition Screening Tool  ASSESSMENT:   29 year old with h/o renal transplant in 2007 on chronic prednisone/myfortic/prograf, h/o insulin dependent diabetes using insulin pump, presented to [facility] ED due to d/o dehydration, weakness, she also reported fever of 102 on Friday, reported cough, minimal productive, running nose, sore throat since got flu shot early October, She was given amoxcillin for possible bronchitis, she also used claritin, running nose has stopped, but she continued to cough, she also noticed right lower abdominal pain, dark urine, frequent loose stool 3-4 time a day since early October. Reported last treated for uti in august this year. Reported vomited once in the ED. Denies chest pain, no sob, no extremity edema, no skin rash. 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  Pt seen for MST. BMI indicates obesity. No intakes documented but visualized breakfast tray with 50% completion bacon, bagel, and grits. Pt reports fair appetite this AM which was decreased for at least 5 days PTA. She states nausea with intermittent vomiting PTA but that this has mainly resolved since admission. She states small amount of emesis this AM which she states was mainly saliva.   Pt states that over the past 1 year she has been losing weight but is unsure of recent weight trends. Per chart review, pt has lost 4 lbs (2.5% body  weight) in the past 11 days which is significant for this time frame. No muscle or fat wasting noted.   She states that she tries to eat sugar-free candy only but that she is not always consistent in this practice. She eats a wide variety of foods and cooks at home. 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  Will provide education later today or tomorrow (10/25).  Pt likely not meeting needs for a few days PTA. Medications reviewed. Labs reviewed; CBGs: 178-566 mg/dL, Na: 131 mmol/L, BUN/creatinine elevated but trending down, Ca: 8.2 mg/dL, GFR: 26.   Diet Order:  Diet Carb Modified Fluid consistency:: Thin; Room service appropriate?: Yes  Skin:  Reviewed, no issues  Last BM:  10/24  Height:   Ht Readings from Last 1 Encounters:  01/05/15 5\' 6"  (1.676 m)    Weight:   Wt Readings from Last 1 Encounters:  01/05/15 156 lb (70.761 kg)    Ideal Body Weight:  59.09 kg (kg)  BMI:  Body mass index is 25.19 kg/(m^2).  Estimated Nutritional Needs:   Kcal:  1700-1900  Protein:  55-65 grams  Fluid:  2-2.2 L/day  EDUCATION NEEDS:   No education needs identified at this time      Jarome Matin, RD,  LDN Inpatient Clinical Dietitian Pager # [phone] After hours/weekend pager # [phone]

## 2015-01-06 NOTE — Care Management Note (Signed)
Case Management Note  Patient Details  Name: [name] MRN: [id number] Date of Birth: Jul 28, 1985  Subjective/Objective:29 y/o f admitted w/ARF, hyperglycemia. From home.                    Action/Plan:d/c plan home.   Expected Discharge Date:                  Expected Discharge Plan:  Home/Self Care  In-House Referral:     Discharge planning Services  CM Consult  Post Acute Care Choice:    Choice offered to:     DME Arranged:    DME Agency:     HH Arranged:    HH Agency:     Status of Service:  In process, will continue to follow  Medicare Important Message Given:    Date Medicare IM Given:    Medicare IM give by:    Date Additional Medicare IM Given:    Additional Medicare Important Message give by:     If discussed at [place] of Stay Meetings, dates discussed:    Additional Comments:  Dessa Phi, RN 01/06/2015, 2:03 PM

## 2015-01-06 NOTE — Progress Notes (Signed)
Inpatient Diabetes Program Recommendations  AACE/ADA: New Consensus Statement on Inpatient Glycemic Control (2015)  Target Ranges:  Prepandial:   less than 140 mg/dL      Peak postprandial:   less than 180 mg/dL (1-2 hours)      Critically ill patients:  140 - 180 mg/dL   Review of Glycemic Control  Diabetes history: DM1 Outpatient Diabetes medications: insulin pump - Pump settings - 2.25units/hour with 1:10 CHO ratio and CF 40 Current orders for Inpatient glycemic control: Lantus 40 units bid, Novolog moderate tidwc and hs  Inpatient Diabetes Program Recommendations:    Transitioned to basal/bolus insulin - Agree with Levemir 40 units bid and titrate Add Novolog 4 units tidwc for meal coverage insulin Do not restart pump while inpatient.  Long discussion regarding glycemic control and importance of checking blood sugars and taking logbook to endo appt.  Discussed HgbA1C and goals. Pt states she will attend OP Diabetes Education. Will order.  Doubtful insulin pump is being utilized with high HgbA1c. Recommend multiple daily injections and tight glucose control.   Discussed with Hospitalist. Thank you. Lorenda Peck, RD, LDN, CDE Inpatient Diabetes Coordinator [phone]

## 2015-01-06 NOTE — Progress Notes (Signed)
PROGRESS NOTE  [name] [id number] DOB: 24-Jul-1985 DOA: 01/05/2015 PCP: Elyn Peers, MD  HPI/Recap of past 24 hours:  Feeling better, no more vomiting, still slight right lower abdominal pain but much improved, persistent dry cough, denies sob, no chest pain, still having loose stool.  Not actively involved in care  Assessment/Plan: Active Problems:   Acute renal failure (HCC)   Renal failure (ARF), acute on chronic (HCC)   Insulin dependent diabetes mellitus (HCC)   Fever, unspecified   Renal transplant recipient   Immunosuppressed status ([place])   Hyponatremia   Hyperglycemia   Malnutrition of moderate degree  Fever:  reported fever of 102 at home, no fever in the hospital.   cxr unremarkable, blood culture done in the ED, flu pcr negative, rapid strep test negative, ua no sign of infection, cdiff pending, bk virus pending.  Cough: cxr unremarkable, flu prc negative. mucinex.  Diarrhea: reported 3-4 time per day, since early October, c diff pending.  Right lower abdominal pain, (reported similar symptom from her last uti in august this year)  ua/renal US no acute finding  ARF on ckd III, ua /renal US, improving on ivf , possibly from dehydration from uncontrolled blood glucose.  Hyperglycemia, with normal gap. Insulin drip started from the ED, blood glucose improved, transitioned to subQ insulin.  Hyponatremia: likely combination of dehydration and hyperglycemia, normalized with ivf and correct hyperglycemia.  Insulin dependent diabetes: patient refer not to use her own insulin pump. Diabetic RN consulted. Endocrinologist Dr. Renato Shin.  s/p renal transplant with h/o recurrent UTI and transplant pyelo,  on prednisone/myfortic/prograf: check drug levels, check bk virus. Nephrology consulted. Urology Dr. Alexis Frock (patient reported she is given keflex to take after each sexually intercourse)  Code Status: full  Family Communication: patient and  significant other  Disposition Plan: home likely tomorrow   Consultants:  nephrology  Procedures:  none  Antibiotics:  none   Objective: BP 115/79 mmHg  Pulse 80  Temp(Src) 97.9 F (36.6 C) (Oral)  Resp 18  Ht 5\' 6"  (1.676 m)  Wt 156 lb (70.761 kg)  BMI 25.19 kg/m2  SpO2 99%  Intake/Output Summary (Last 24 hours) at 01/06/15 1340 Last data filed at 01/06/15 1224  Gross per 24 hour  Intake   1540 ml  Output    850 ml  Net    690 ml   Filed Weights   01/05/15 1900  Weight: 156 lb (70.761 kg)    Exam:   General: aaox3  Eyes: PERRL  ENT: unremarkable  Neck: supple, no JVD  Cardiovascular: RRR  Respiratory: CTABL  Abdomen: mild right lower abdominal pain, no guarding, no rebound, soft/ND, positive bowel sounds. Old healed surgical scar.  Skin: no rash  Musculoskeletal: No edema  Psychiatric: calm/cooperative, but somewhat distance, not very actively involved in care,   Neurologic: no focal findings    Data Reviewed: Basic Metabolic Panel:  Recent Labs Lab 01/05/15 1451 01/05/15 1910 01/06/15 0550  NA 126* 131* 137  K 4.2 3.6 4.2  CL 94* 102 106  CO2 20* 20* 22  GLUCOSE 607* 373* 171*  BUN 51* 49* 48*  CREATININE 3.18* 2.71* 2.47*  CALCIUM 9.0 8.2* 8.7*   Liver Function Tests:  Recent Labs Lab 01/06/15 0550  AST 22  ALT 15  ALKPHOS 68  BILITOT 0.5  PROT 7.6  ALBUMIN 3.1*   No results for input(s): LIPASE, AMYLASE in the last 168 hours. No results for input(s): AMMONIA in the  last 168 hours. CBC:  Recent Labs Lab 01/05/15 1451 01/06/15 0550  WBC 11.8* 8.3  NEUTROABS  --  6.6  HGB 10.1* 9.7*  HCT 31.0* 28.8*  MCV 88.6 87.8  PLT 295 297   Cardiac Enzymes:    Recent Labs Lab 01/06/15 0550  CKTOTAL 25*   BNP (last 3 results) No results for input(s): BNP in the last 8760 hours.  ProBNP (last 3 results) No results for input(s): PROBNP in the last 8760 hours.  CBG:  Recent Labs Lab 01/06/15 0521  01/06/15 0628 01/06/15 0731 01/06/15 0935 01/06/15 1047  GLUCAP 156* 178* 150* 92 190*    Recent Results (from the past 240 hour(s))  MRSA PCR Screening     Status: None   Collection Time: 01/05/15  1:45 PM  Result Value Ref Range Status   MRSA by PCR NEGATIVE NEGATIVE Final    Comment:        The GeneXpert MRSA Assay (FDA approved for NASAL specimens only), is one component of a comprehensive MRSA colonization surveillance program. It is not intended to diagnose MRSA infection nor to guide or monitor treatment for MRSA infections.   Rapid strep screen (not at [facility])     Status: None   Collection Time: 01/05/15  7:00 PM  Result Value Ref Range Status   Streptococcus, Group A Screen (Direct) NEGATIVE NEGATIVE Final    Comment: (NOTE) A Rapid Antigen test may result negative if the antigen level in the sample is below the detection level of this test. The FDA has not cleared this test as a stand-alone test therefore the rapid antigen negative result has reflexed to a Group A Strep culture.      Studies: Dg Chest 2 View  01/05/2015  CLINICAL DATA:  Cough.  Fever.  Renal transplant. EXAM: CHEST  2 VIEW COMPARISON:  09/30/2014 FINDINGS: The heart size and mediastinal contours are within normal limits. Both lungs are clear. The visualized skeletal structures are unremarkable. IMPRESSION: Normal exam. Electronically Signed   By: Lorriane Shire M.D.   On: 01/05/2015 17:17   US Renal  01/06/2015  CLINICAL DATA:  Acute onset of renal failure.  Initial encounter. EXAM: RENAL / URINARY TRACT ULTRASOUND COMPLETE COMPARISON:  CT of the abdomen and pelvis performed 06/23/2014 FINDINGS: Right Kidney: The right kidney is absent, on correlation with prior CT. Left Kidney: The native left kidney is not visualized. Transplant Kidney: The transplant kidney at the right iliac fossa demonstrates multiple peripheral cysts, as on the prior study, and is grossly unremarkable in appearance. There is  no evidence of hydronephrosis. Bladder: Appears normal for degree of bladder distention. IMPRESSION: No evidence of hydronephrosis. Transplant kidney demonstrates multiple peripheral cysts, but is otherwise unremarkable. Electronically Signed   By: Garald Balding M.D.   On: 01/06/2015 01:58    Scheduled Meds: . sodium chloride   Intravenous STAT  . insulin aspart  0-15 Units Subcutaneous TID WC  . insulin glargine  40 Units Subcutaneous BID  . mycophenolate  720 mg Oral BID  . predniSONE  5 mg Oral QHS  . simvastatin  20 mg Oral QPM  . tacrolimus  2 mg Oral BID    Continuous Infusions: . sodium chloride       Time spent: 19mins  Mickayla Trouten MD, PhD  [facility] Pager [phone]. If 7PM-7AM, please contact night-coverage at [email or web address], password [facility] 01/06/2015, 1:40 PM  LOS: 1 day

## 2015-01-06 NOTE — Progress Notes (Signed)
Pt in room 1407, BS was 152 at 0300 and 149 at 0400. For some reason the glucose meter is not docking like it should so you can't see it on the computer. Carmela Hurt, RN

## 2015-01-06 NOTE — Progress Notes (Signed)
[facility] Rounding Note  Subjective:  Says feeling better States she "can't check her BS to bolus herself at work or have water with her at work" and that's how her sugar "got so bad" Has minimal discomfort now RLQ (and she can't really distinguish it from more chronic sx she has had since her transplant with a "pulling" sensation) Still with "sniffles" Making urine Creatinine slowly falling  Objective Vital signs in last 24 hours: Filed Vitals:   01/05/15 1900 01/05/15 1954 01/06/15 0525 01/06/15 1350  BP:  118/75 115/79 118/82  Pulse:  102 80 98  Temp:  99.1 F (37.3 C) 97.9 F (36.6 C) 98.9 F (37.2 C)  TempSrc:  Oral Oral Oral  Resp:  18 18 17   Height: 5\' 6"  (1.676 m)     Weight: 70.761 kg (156 lb)     SpO2:  99% 99% 100%   Weight change:   Intake/Output Summary (Last 24 hours) at 01/06/15 1445 Last data filed at 01/06/15 1224  Gross per 24 hour  Intake   1540 ml  Output    850 ml  Net    690 ml    Physical Exam:  BP 118/82 mmHg  Pulse 98  Temp(Src) 98.9 F (37.2 C) (Oral)  Resp 17  Ht 5\' 6"  (1.676 m)  Wt 70.761 kg (156 lb)  BMI 25.19 kg/m2  SpO2 100%  Well app AAF VS as noted No rash Sclera anicteric No jvd Chest clear bilat RRR no MRG ABd soft ntnd  RLQ transplant nontender  No LE or UE edema  Labs: Basic Metabolic Panel:  Recent Labs Lab 01/05/15 1451 01/05/15 1910 01/06/15 0550  NA 126* 131* 137  K 4.2 3.6 4.2  CL 94* 102 106  CO2 20* 20* 22  GLUCOSE 607* 373* 171*  BUN 51* 49* 48*  CREATININE 3.18* 2.71* 2.47*  CALCIUM 9.0 8.2* 8.7*   Liver Function Tests:  Recent Labs Lab 01/06/15 0550  AST 22  ALT 15  ALKPHOS 68  BILITOT 0.5  PROT 7.6  ALBUMIN 3.1*   CBC:  Recent Labs Lab 01/05/15 1451 01/06/15 0550  WBC 11.8* 8.3  NEUTROABS  --  6.6  HGB 10.1* 9.7*  HCT 31.0* 28.8*  MCV 88.6 87.8  PLT 295 297     Recent Labs Lab 01/06/15 0550  CKTOTAL 25*     Recent Labs Lab 01/06/15 0628  01/06/15 0731 01/06/15 0935 01/06/15 1047 01/06/15 1213  GLUCAP 178* 150* 92 190* 188*    Studies/Results: Dg Chest 2 View  01/05/2015  CLINICAL DATA:  Cough.  Fever.  Renal transplant. EXAM: CHEST  2 VIEW COMPARISON:  09/30/2014 FINDINGS: The heart size and mediastinal contours are within normal limits. Both lungs are clear. The visualized skeletal structures are unremarkable. IMPRESSION: Normal exam. Electronically Signed   By: Lorriane Shire M.D.   On: 01/05/2015 17:17   US Renal  01/06/2015  CLINICAL DATA:  Acute onset of renal failure.  Initial encounter. EXAM: RENAL / URINARY TRACT ULTRASOUND COMPLETE COMPARISON:  CT of the abdomen and pelvis performed 06/23/2014 FINDINGS: Right Kidney: The right kidney is absent, on correlation with prior CT. Left Kidney: The native left kidney is not visualized. Transplant Kidney: The transplant kidney at the right iliac fossa demonstrates multiple peripheral cysts, as on the prior study, and is grossly unremarkable in appearance. There is no evidence of hydronephrosis. Bladder: Appears normal for degree of bladder distention. IMPRESSION: No evidence of hydronephrosis. Transplant kidney demonstrates multiple  peripheral cysts, but is otherwise unremarkable. Electronically Signed   By: Garald Balding M.D.   On: 01/06/2015 01:58   Medications: . sodium chloride 75 mL/hr at 01/06/15 1348   . sodium chloride   Intravenous STAT  . guaiFENesin  600 mg Oral BID  . insulin aspart  0-15 Units Subcutaneous TID WC  . insulin glargine  40 Units Subcutaneous BID  . mycophenolate  720 mg Oral BID  . predniSONE  5 mg Oral QHS  . simvastatin  20 mg Oral QPM  . tacrolimus  2 mg Oral BID   Assessment: 1. Acute on CKD / renal transplant - prob due to vol depletion/ hypotension. Less likely rejection/ transplant infection. Creatinine is improving with IVF.  Baseline is 1.5-1.7. If it falls again tomorrow, and no + studies/cultures should be OK to go home  tomorrow. 2. Fevers/ myalgias/ URI/ cough - after flu shot, probable secondary to vaccination. Feeling some better. 3. 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 BS control improving. Needs a note to take to work to allow her a couple of 10 minute breaks to check her sugar and bolus herself with insulin (currently says "not allowed") 5. Hx recurrent UTI/ transplant pyelo - f/b urology. Doesn't look infected at this time. 6. Anemia - gets procrit 30,000 units every 2 weeks at [place]. Last injection was 12/26/14 - will be due again on the 27th and she can go for that per usual.    Jamal Maes, MD [facility] [phone] pager 01/06/2015, 2:45 PM

## 2015-01-07 LAB — RENAL FUNCTION PANEL
ALBUMIN: 2.9 g/dL — AB (ref 3.5–5.0)
ANION GAP: 9 (ref 5–15)
BUN: 43 mg/dL — ABNORMAL HIGH (ref 6–20)
CALCIUM: 8.2 mg/dL — AB (ref 8.9–10.3)
CO2: 20 mmol/L — ABNORMAL LOW (ref 22–32)
CREATININE: 2.2 mg/dL — AB (ref 0.44–1.00)
Chloride: 108 mmol/L (ref 101–111)
GFR calc non Af Amer: 29 mL/min — ABNORMAL LOW (ref >60)
GFR, EST AFRICAN AMERICAN: 33 mL/min — AB (ref >60)
Glucose, Bld: 233 mg/dL — ABNORMAL HIGH (ref 65–99)
PHOSPHORUS: 3.6 mg/dL (ref 2.5–4.6)
Potassium: 4.2 mmol/L (ref 3.5–5.1)
SODIUM: 137 mmol/L (ref 135–145)

## 2015-01-07 LAB — LEGIONELLA PNEUMOPHILA SEROGP 1 UR AG: L. pneumophila Serogp 1 Ur Ag: NEGATIVE

## 2015-01-07 LAB — GLUCOSE, CAPILLARY
GLUCOSE-CAPILLARY: 228 mg/dL — AB (ref 65–99)
Glucose-Capillary: 266 mg/dL — ABNORMAL HIGH (ref 65–99)
Glucose-Capillary: 121 mg/dL — ABNORMAL HIGH (ref 65–99)

## 2015-01-07 LAB — MYCOPHENOLIC ACID (CELLCEPT)
MPA Glucuronide: 3 ug/mL — ABNORMAL LOW (ref 15–125)
MPA: 1.3 ug/mL (ref 1.0–3.5)

## 2015-01-07 LAB — TACROLIMUS LEVEL: TACROLIMUS (FK506) - LABCORP: 7.6 ng/mL (ref 2.0–20.0)

## 2015-01-07 NOTE — Care Management Important Message (Signed)
Important Message  Patient Details IM Letter given to Kathy/Case Manager to present to [place] Message  Patient Details  Name: ADDYSAN GOTTE MRN: [id number] Date of Birth: 30-May-1985   Medicare Important Message Given:  Yes-second notification given    Camillo Flaming 01/07/2015, 12:05 PM Name: ADORABELLA RESENDIZ MRN: [id number] Date of Birth: November 19, 1985   Medicare Important Message Given:  Yes-second notification given    Camillo Flaming 01/07/2015, 12:04 PM

## 2015-01-07 NOTE — Progress Notes (Signed)
Inpatient Diabetes Program Recommendations  AACE/ADA: New Consensus Statement on Inpatient Glycemic Control (2015)  Target Ranges:  Prepandial:   less than 140 mg/dL      Peak postprandial:   less than 180 mg/dL (1-2 hours)      Critically ill patients:  140 - 180 mg/dL   Review of Glycemic Control  Results for [name], [name] (MRN [id number]) as of 01/07/2015 10:14  Ref. Range 01/06/2015 16:48 01/06/2015 20:28 01/07/2015 05:38 01/07/2015 07:40  Glucose-Capillary Latest Ref Range: 65-99 mg/dL 227 (H) 223 (H) 266 (H) 228 (H)   Blood sugars still elevated in 200s.  Inpatient Diabetes Program Recommendations: Increase Lantus to 44 units bid.    F/U with endo for insulin pump instruction vs basal/bolus insulin. OP Diabetes Education ordered.   Thank you. Lorenda Peck, RD, LDN, CDE Inpatient Diabetes Coordinator [phone]

## 2015-01-07 NOTE — Progress Notes (Signed)
[facility] Rounding Note  Subjective:  UOP about a liter past 24 hours Afebrile X 24 hours Continued slow improvement in renal function Had a good night No diarrhea  Objective Vital signs in last 24 hours: Filed Vitals:   01/06/15 0525 01/06/15 1350 01/06/15 2117 01/07/15 0628  BP: 115/79 118/82 111/76 126/86  Pulse: 80 98 86 91  Temp: 97.9 F (36.6 C) 98.9 F (37.2 C) 97.9 F (36.6 C) 98.9 F (37.2 C)  TempSrc: Oral Oral Oral Oral  Resp: 18 17 18 18   Height:      Weight:      SpO2: 99% 100% 98% 100%   Weight change:   Intake/Output Summary (Last 24 hours) at 01/07/15 0940 Last data filed at 01/07/15 0600  Gross per 24 hour  Intake   1725 ml  Output   1050 ml  Net    675 ml    Physical Exam:  BP 126/86 mmHg  Pulse 91  Temp(Src) 98.9 F (37.2 C) (Oral)  Resp 18  Ht 5\' 6"  (1.676 m)  Wt 70.761 kg (156 lb)  BMI 25.19 kg/m2  SpO2 100%  Well app AAF VS as noted No rash Sclera anicteric No jvd Chest clear bilat RRR no MRG ABd soft ntnd  RLQ transplant nontender  No LE or UE edema  Labs: Basic Metabolic Panel:  Recent Labs Lab 01/05/15 1451 01/05/15 1910 01/06/15 0550 01/07/15 0800  NA 126* 131* 137 137  K 4.2 3.6 4.2 4.2  CL 94* 102 106 108  CO2 20* 20* 22 20*  GLUCOSE 607* 373* 171* 233*  BUN 51* 49* 48* 43*  CREATININE 3.18* 2.71* 2.47* 2.20*  CALCIUM 9.0 8.2* 8.7* 8.2*  PHOS  --   --   --  3.6   Liver Function Tests:  Recent Labs Lab 01/06/15 0550 01/07/15 0800  AST 22  --   ALT 15  --   ALKPHOS 68  --   BILITOT 0.5  --   PROT 7.6  --   ALBUMIN 3.1* 2.9*   CBC:  Recent Labs Lab 01/05/15 1451 01/06/15 0550  WBC 11.8* 8.3  NEUTROABS  --  6.6  HGB 10.1* 9.7*  HCT 31.0* 28.8*  MCV 88.6 87.8  PLT 295 297     Recent Labs Lab 01/06/15 0550  CKTOTAL 25*     Recent Labs Lab 01/06/15 1213 01/06/15 1648 01/06/15 2028 01/07/15 0538 01/07/15 0740  GLUCAP 188* 227* 223* 266* 228*   Influenza panel  negative CDiff never sent (no more diarrhea)  Studies/Results: Dg Chest 2 View  01/05/2015  CLINICAL DATA:  Cough.  Fever.  Renal transplant. EXAM: CHEST  2 VIEW COMPARISON:  09/30/2014 FINDINGS: The heart size and mediastinal contours are within normal limits. Both lungs are clear. The visualized skeletal structures are unremarkable. IMPRESSION: Normal exam. Electronically Signed   By: Lorriane Shire M.D.   On: 01/05/2015 17:17   US Renal  01/06/2015  CLINICAL DATA:  Acute onset of renal failure.  Initial encounter. EXAM: RENAL / URINARY TRACT ULTRASOUND COMPLETE COMPARISON:  CT of the abdomen and pelvis performed 06/23/2014 FINDINGS: Right Kidney: The right kidney is absent, on correlation with prior CT. Left Kidney: The native left kidney is not visualized. Transplant Kidney: The transplant kidney at the right iliac fossa demonstrates multiple peripheral cysts, as on the prior study, and is grossly unremarkable in appearance. There is no evidence of hydronephrosis. Bladder: Appears normal for degree of bladder distention. IMPRESSION: No evidence  of hydronephrosis. Transplant kidney demonstrates multiple peripheral cysts, but is otherwise unremarkable. Electronically Signed   By: Garald Balding M.D.   On: 01/06/2015 01:58   Medications: . sodium chloride 75 mL/hr at 01/06/15 1348   . guaiFENesin  600 mg Oral BID  . insulin aspart  0-15 Units Subcutaneous TID WC  . insulin glargine  40 Units Subcutaneous BID  . mycophenolate  720 mg Oral BID  . predniSONE  5 mg Oral QHS  . simvastatin  20 mg Oral QPM  . tacrolimus  2 mg Oral BID   Assessment: 1. Acute on CKD / renal transplant - due to vol depletion/ hypotension. Creatinine is continuing to slowly fall.  Still not quite at 1.5-1.7 baseline but headed that way. I think could go home today.           2. Fevers/ myalgias/ URI/ cough - after flu shot, probable secondary to vaccination. Resolved 3. Renal Tx - cont Tx meds 4. DM2 post-Tx -  takes insulin pump. BS control improving. Needs a note to take to work to allow her a couple of 10 minute breaks to check her sugar and bolus herself with insulin (currently says "not allowed") 5. Hx recurrent UTI/ transplant pyelo - f/b urology. No evidence for infection. 6. Anemia - gets procrit 30,000 units every 2 weeks at [place]. Last injection was 12/26/14 - will be due again on the 27th and she can go for that per usual.  7. Dispo - OK with me for discharge. I will arrange for her to have f/u labs done post discharge.   Jamal Maes, MD [facility] [phone] pager 01/07/2015, 9:40 AM

## 2015-01-07 NOTE — Discharge Summary (Signed)
Discharge Summary  [name] [id number] DOB: 18-Dec-1985  PCP: Elyn Peers, MD  Admit date: 01/05/2015 Discharge date: 01/07/2015  Time spent: <7mins  Recommendations for Outpatient Follow-up:  1. F/u with endocrinology Dr. Renato Shin in three days  2. OP diabetes education, scheduled by diabetes RN Lorenda Peck. 3. F/u with nephrology for ckd 4. F/u with urology s/p renal transplant  Discharge Diagnoses:  Active Hospital Problems   Diagnosis Date Noted  . Malnutrition of moderate degree 01/06/2015  . Renal failure (ARF), acute on chronic (HCC) 01/05/2015  . Insulin dependent diabetes mellitus ([place])   . Fever, unspecified   . Renal transplant recipient   . Immunosuppressed status ([place])   . Hyponatremia   . Hyperglycemia   . Acute renal failure ([place]) 09/30/2014    Resolved Hospital Problems   Diagnosis Date Noted Date Resolved  No resolved problems to display.    Discharge Condition: stable  Diet recommendation: heart healthy/carb modified  Filed Weights   01/05/15 1900  Weight: 156 lb (70.761 kg)    History of present illness:  [name] is a 29 y.o. female  With h/o renal transplant in 2007 on chronic prednisone/myfortic/prograf, h/o insulin dependent diabetes using insulin pump, presented to [facility] ED due to d/o dehydration, weakness, she also reported fever of 102 on Friday, reported cough, minimal productive, running nose, sore throat since got flu shot early October, She was given amoxcillin for possible bronchitis, she also used claritin, running nose has stopped, but she continued to cough, she also noticed right lower abdominal pain, dark urine, frequent loose stool 3-4 time a day since early October. Reported last treated for uti in august this year. Reported vomited once in the ED. Denies chest pain, no sob, no extremity edema, no skin rash. 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    Hospital Course:  Active  Problems:   Acute renal failure (HCC)   Renal failure (ARF), acute on chronic (HCC)   Insulin dependent diabetes mellitus (HCC)   Fever, unspecified   Renal transplant recipient   Immunosuppressed status ([place])   Hyponatremia   Hyperglycemia   Malnutrition of moderate degree  Dehydration: ivf, likely from uncontrolled blood glucose, patient reported not allowed to take break to check blood sugar or drink water at work , letter provided to advise patient should be allow to take 3 84mins break during 8hr shift.   Fever:  reported fever of 102 at home, no fever in the hospital.  cxr unremarkable, blood culture done in the ED, flu pcr negative, rapid strep test negative, ua no sign of infection, no diarrhea in the hospital, bk virus pending, renal function recovering, no fever in the hospital.  Cough: cxr unremarkable, flu prc negative. Mucinex. Possible from flu vaccine.  Diarrhea: reported 3-4 time per day, since early October, no diarrhea in the hospital.  Right lower abdominal pain, (reported similar symptom from her last uti in august this year) ua/renal US no acute finding, per nephrology patient has been having chronic pulling sensation right lower quadrant since renal transplant.  ARF on ckd III, ua /renal US no acute findings, improving on ivf , possibly from dehydration from uncontrolled blood glucose.  Hyperglycemia, with normal gap. Insulin drip started from the ED, blood glucose improved, transitioned to subQ insulin.  Hyponatremia: likely combination of dehydration and hyperglycemia, normalized with ivf and correct hyperglycemia.  Insulin dependent diabetes: patient refer not to use her own insulin pump while in  the hospital. She was treated with insulin drip initially, then with lantus and novolog ssi, Diabetic RN consulted. Endocrinologist Dr. Renato Shin.  s/p renal transplant with h/o recurrent UTI and transplant pyelo,  on prednisone/myfortic/prograf: check drug  levels,  bk virus result pending, no fever in the hospital, renal function recovering. Nephrology consulted. Urology Dr. Alexis Frock (patient reported she is given keflex to take after each sexually intercourse)  Code Status: full  Family Communication: patient  Disposition Plan: home 10/25   Consultants:  Nephrology  Diabetic RN  Procedures:  none  Antibiotics:  none  Discharge Exam: BP 126/86 mmHg  Pulse 91  Temp(Src) 98.9 F (37.2 C) (Oral)  Resp 18  Ht 5\' 6"  (1.676 m)  Wt 156 lb (70.761 kg)  BMI 25.19 kg/m2  SpO2 100%   General: aaox3  Eyes: PERRL  ENT: unremarkable  Neck: supple, no JVD  Cardiovascular: RRR  Respiratory: CTABL  Abdomen: mild right lower abdominal pain, no guarding, no rebound, soft/ND, positive bowel sounds. Old healed surgical scar.  Skin: no rash  Musculoskeletal: No edema  Psychiatric: calm/cooperative, but somewhat distance, not very actively involved in care,   Neurologic: no focal findings   Discharge Instructions You were cared for by a hospitalist during your hospital stay. If you have any questions about your discharge medications or the care you received while you were in the hospital after you are discharged, you can call the unit and asked to speak with the hospitalist on call if the hospitalist that took care of you is not available. Once you are discharged, your primary care physician will handle any further medical issues. Please note that NO REFILLS for any discharge medications will be authorized once you are discharged, as it is imperative that you return to your primary care physician (or establish a relationship with a primary care physician if you do not have one) for your aftercare needs so that they can reassess your need for medications and monitor your lab values.  Discharge Instructions    Diet - low sodium heart healthy    Complete by:  As directed   Carb modified     Increase activity slowly     Complete by:  As directed             Medication List    TAKE these medications        acetaminophen 500 MG tablet  Commonly known as:  TYLENOL  Take 1,000 mg by mouth every 6 (six) hours as needed for mild pain.     calcium carbonate 500 MG chewable tablet  Commonly known as:  TUMS - dosed in mg elemental calcium  Chew 2 tablets by mouth 3 (three) times a week. Takes on Monday, wednesdays, and fridays     glucose blood test strip  Commonly known as:  BAYER CONTOUR NEXT TEST  Check 8 times per day, and lancets     hydrOXYzine 25 MG tablet  Commonly known as:  ATARAX/VISTARIL  Take 25 mg by mouth 2 (two) times daily as needed for itching.     mycophenolate 360 MG Tbec EC tablet  Commonly known as:  MYFORTIC  Take 720 mg by mouth 2 (two) times daily.     NOVOLOG 100 UNIT/ML injection  Generic drug:  insulin aspart  For use in pump, total of 80 units per day.     POLY-IRON 150 150 MG capsule  Generic drug:  iron polysaccharides  Take 150 mg by mouth 2 (  two) times daily as needed (supplement).     predniSONE 5 MG tablet  Commonly known as:  DELTASONE  Take 5 mg by mouth at bedtime.     promethazine 12.5 MG tablet  Commonly known as:  PHENERGAN  Take 1 tablet (12.5 mg total) by mouth every 6 (six) hours as needed for nausea or vomiting.     ranitidine 150 MG tablet  Commonly known as:  ZANTAC  Take 1 tablet (150 mg total) by mouth daily as needed for heartburn.     simvastatin 20 MG tablet  Commonly known as:  ZOCOR  Take 20 mg by mouth every evening.     tacrolimus 1 MG capsule  Commonly known as:  PROGRAF  Take 2 mg by mouth 2 (two) times daily.       Allergies  Allergen Reactions  . Motrin [Ibuprofen] Shortness Of Breath and Itching    Per pt  . Banana Other (See Comments)    Sick on the stomach  . Diphenhydramine Hcl     REACTION: Stopped breathing in ICU  . Iron Dextran     REACTION: vein irritation  . Shellfish Allergy Hives       Follow-up  Information    Follow up with Renato Shin, MD In 3 days.   Specialty:  Endocrinology   Why:  insulin dependent diabetes   Contact information:   [address] [place] [address] [phone]       Follow up with DUNHAM,CYNTHIA B, MD In 2 weeks.   Specialty:  Nephrology   Why:  ckd, s/p renal transplant   Contact information:   [place] [address] [phone]       Follow up with Alexis Frock, MD In 1 month.   Specialty:  Urology   Why:  s/p renal transplant   Contact information:   [place] [address] [phone]        The results of significant diagnostics from this hospitalization (including imaging, microbiology, ancillary and laboratory) are listed below for reference.    Significant Diagnostic Studies: Dg Chest 2 View  01/05/2015  CLINICAL DATA:  Cough.  Fever.  Renal transplant. EXAM: CHEST  2 VIEW COMPARISON:  09/30/2014 FINDINGS: The heart size and mediastinal contours are within normal limits. Both lungs are clear. The visualized skeletal structures are unremarkable. IMPRESSION: Normal exam. Electronically Signed   By: Lorriane Shire M.D.   On: 01/05/2015 17:17   US Renal  01/06/2015  CLINICAL DATA:  Acute onset of renal failure.  Initial encounter. EXAM: RENAL / URINARY TRACT ULTRASOUND COMPLETE COMPARISON:  CT of the abdomen and pelvis performed 06/23/2014 FINDINGS: Right Kidney: The right kidney is absent, on correlation with prior CT. Left Kidney: The native left kidney is not visualized. Transplant Kidney: The transplant kidney at the right iliac fossa demonstrates multiple peripheral cysts, as on the prior study, and is grossly unremarkable in appearance. There is no evidence of hydronephrosis. Bladder: Appears normal for degree of bladder distention. IMPRESSION: No evidence of hydronephrosis. Transplant kidney demonstrates multiple peripheral cysts, but is otherwise unremarkable. Electronically Signed    By: Garald Balding M.D.   On: 01/06/2015 01:58    Microbiology: Recent Results (from the past 240 hour(s))  MRSA PCR Screening     Status: None   Collection Time: 01/05/15  1:45 PM  Result Value Ref Range Status   MRSA by PCR NEGATIVE NEGATIVE Final    Comment:  The GeneXpert MRSA Assay (FDA approved for NASAL specimens only), is one component of a comprehensive MRSA colonization surveillance program. It is not intended to diagnose MRSA infection nor to guide or monitor treatment for MRSA infections.   Blood culture (routine x 2)     Status: None (Preliminary result)   Collection Time: 01/05/15  4:49 PM  Result Value Ref Range Status   Specimen Description BLOOD RIGHT ANTECUBITAL  Final   Special Requests BOTTLES DRAWN AEROBIC AND ANAEROBIC 5CC EACH  Final   Culture   Final    NO GROWTH < 24 HOURS Performed at [facility]    Report Status PENDING  Incomplete  Blood culture (routine x 2)     Status: None (Preliminary result)   Collection Time: 01/05/15  5:00 PM  Result Value Ref Range Status   Specimen Description BLOOD RIGHT HAND  Final   Special Requests BOTTLES DRAWN AEROBIC AND ANAEROBIC 5 CC EACH  Final   Culture   Final    NO GROWTH < 24 HOURS Performed at [facility]    Report Status PENDING  Incomplete  Rapid strep screen (not at [facility])     Status: None   Collection Time: 01/05/15  7:00 PM  Result Value Ref Range Status   Streptococcus, Group A Screen (Direct) NEGATIVE NEGATIVE Final    Comment: (NOTE) A Rapid Antigen test may result negative if the antigen level in the sample is below the detection level of this test. The FDA has not cleared this test as a stand-alone test therefore the rapid antigen negative result has reflexed to a Group A Strep culture.      Labs: Basic Metabolic Panel:  Recent Labs Lab 01/05/15 1451 01/05/15 1910 01/06/15 0550 01/07/15 0800  NA 126* 131* 137 137  K 4.2 3.6 4.2 4.2  CL 94* 102 106 108    CO2 20* 20* 22 20*  GLUCOSE 607* 373* 171* 233*  BUN 51* 49* 48* 43*  CREATININE 3.18* 2.71* 2.47* 2.20*  CALCIUM 9.0 8.2* 8.7* 8.2*  PHOS  --   --   --  3.6   Liver Function Tests:  Recent Labs Lab 01/06/15 0550 01/07/15 0800  AST 22  --   ALT 15  --   ALKPHOS 68  --   BILITOT 0.5  --   PROT 7.6  --   ALBUMIN 3.1* 2.9*   No results for input(s): LIPASE, AMYLASE in the last 168 hours. No results for input(s): AMMONIA in the last 168 hours. CBC:  Recent Labs Lab 01/05/15 1451 01/06/15 0550  WBC 11.8* 8.3  NEUTROABS  --  6.6  HGB 10.1* 9.7*  HCT 31.0* 28.8*  MCV 88.6 87.8  PLT 295 297   Cardiac Enzymes:  Recent Labs Lab 01/06/15 0550  CKTOTAL 25*   BNP: BNP (last 3 results) No results for input(s): BNP in the last 8760 hours.  ProBNP (last 3 results) No results for input(s): PROBNP in the last 8760 hours.  CBG:  Recent Labs Lab 01/06/15 1213 01/06/15 1648 01/06/15 2028 01/07/15 0538 01/07/15 0740  GLUCAP 188* 227* 223* 266* 228*       SignedFlorencia Reasons MD, PhD  [facility] 01/07/2015, 10:50 AM

## 2015-01-08 LAB — CULTURE, GROUP A STREP: Strep A Culture: NEGATIVE

## 2015-01-08 LAB — BK VIRUS QUANT PCR, URINE: BK VIRUS DNA QN PCR: NOT DETECTED {copies}/mL

## 2015-01-09 ENCOUNTER — Encounter (HOSPITAL_COMMUNITY): Payer: Self-pay | Admitting: *Deleted

## 2015-01-09 ENCOUNTER — Observation Stay (HOSPITAL_COMMUNITY): Payer: Medicare Other

## 2015-01-09 ENCOUNTER — Inpatient Hospital Stay (HOSPITAL_COMMUNITY)
Admission: EM | Admit: 2015-01-09 | Discharge: 2015-01-12 | DRG: 872 | Disposition: A | Discharge status: Home / Self Care | Payer: Medicare Other | Attending: Family Medicine | Admitting: Family Medicine

## 2015-01-09 ENCOUNTER — Encounter (HOSPITAL_COMMUNITY)
Admission: RE | Admit: 2015-01-09 | Discharge: 2015-01-09 | Disposition: A | Discharge status: Home / Self Care | Payer: Medicare Other | Source: Ambulatory Visit | Attending: Nephrology | Admitting: Nephrology

## 2015-01-09 DIAGNOSIS — Z794 Long term (current) use of insulin: Secondary | ICD-10-CM

## 2015-01-09 DIAGNOSIS — Z79899 Other long term (current) drug therapy: Secondary | ICD-10-CM

## 2015-01-09 DIAGNOSIS — N183 Chronic kidney disease, stage 3 unspecified: Secondary | ICD-10-CM | POA: Diagnosis present

## 2015-01-09 DIAGNOSIS — D631 Anemia in chronic kidney disease: Secondary | ICD-10-CM | POA: Diagnosis not present

## 2015-01-09 DIAGNOSIS — R1030 Lower abdominal pain, unspecified: Secondary | ICD-10-CM | POA: Diagnosis not present

## 2015-01-09 DIAGNOSIS — N390 Urinary tract infection, site not specified: Secondary | ICD-10-CM | POA: Diagnosis not present

## 2015-01-09 DIAGNOSIS — I129 Hypertensive chronic kidney disease with stage 1 through stage 4 chronic kidney disease, or unspecified chronic kidney disease: Secondary | ICD-10-CM | POA: Diagnosis not present

## 2015-01-09 DIAGNOSIS — R109 Unspecified abdominal pain: Secondary | ICD-10-CM | POA: Diagnosis present

## 2015-01-09 DIAGNOSIS — K219 Gastro-esophageal reflux disease without esophagitis: Secondary | ICD-10-CM | POA: Diagnosis present

## 2015-01-09 DIAGNOSIS — A419 Sepsis, unspecified organism: Secondary | ICD-10-CM | POA: Diagnosis not present

## 2015-01-09 DIAGNOSIS — R112 Nausea with vomiting, unspecified: Secondary | ICD-10-CM | POA: Diagnosis present

## 2015-01-09 DIAGNOSIS — R1110 Vomiting, unspecified: Secondary | ICD-10-CM | POA: Insufficient documentation

## 2015-01-09 DIAGNOSIS — I10 Essential (primary) hypertension: Secondary | ICD-10-CM | POA: Diagnosis present

## 2015-01-09 DIAGNOSIS — E119 Type 2 diabetes mellitus without complications: Secondary | ICD-10-CM | POA: Diagnosis present

## 2015-01-09 DIAGNOSIS — N3000 Acute cystitis without hematuria: Secondary | ICD-10-CM | POA: Diagnosis not present

## 2015-01-09 DIAGNOSIS — E0810 Diabetes mellitus due to underlying condition with ketoacidosis without coma: Secondary | ICD-10-CM

## 2015-01-09 DIAGNOSIS — E1122 Type 2 diabetes mellitus with diabetic chronic kidney disease: Secondary | ICD-10-CM | POA: Diagnosis present

## 2015-01-09 DIAGNOSIS — Z940 Kidney transplant status: Secondary | ICD-10-CM

## 2015-01-09 DIAGNOSIS — R110 Nausea: Secondary | ICD-10-CM | POA: Diagnosis not present

## 2015-01-09 DIAGNOSIS — Z833 Family history of diabetes mellitus: Secondary | ICD-10-CM

## 2015-01-09 DIAGNOSIS — D899 Disorder involving the immune mechanism, unspecified: Secondary | ICD-10-CM

## 2015-01-09 DIAGNOSIS — B961 Klebsiella pneumoniae [K. pneumoniae] as the cause of diseases classified elsewhere: Secondary | ICD-10-CM | POA: Diagnosis not present

## 2015-01-09 DIAGNOSIS — N189 Chronic kidney disease, unspecified: Secondary | ICD-10-CM | POA: Diagnosis present

## 2015-01-09 DIAGNOSIS — Z8261 Family history of arthritis: Secondary | ICD-10-CM

## 2015-01-09 DIAGNOSIS — E860 Dehydration: Secondary | ICD-10-CM | POA: Diagnosis present

## 2015-01-09 DIAGNOSIS — D849 Immunodeficiency, unspecified: Secondary | ICD-10-CM | POA: Diagnosis present

## 2015-01-09 DIAGNOSIS — N12 Tubulo-interstitial nephritis, not specified as acute or chronic: Secondary | ICD-10-CM | POA: Diagnosis present

## 2015-01-09 DIAGNOSIS — E890 Postprocedural hypothyroidism: Secondary | ICD-10-CM | POA: Diagnosis present

## 2015-01-09 DIAGNOSIS — Z8249 Family history of ischemic heart disease and other diseases of the circulatory system: Secondary | ICD-10-CM

## 2015-01-09 DIAGNOSIS — Z9889 Other specified postprocedural states: Secondary | ICD-10-CM

## 2015-01-09 DIAGNOSIS — N179 Acute kidney failure, unspecified: Secondary | ICD-10-CM | POA: Diagnosis not present

## 2015-01-09 DIAGNOSIS — E785 Hyperlipidemia, unspecified: Secondary | ICD-10-CM | POA: Diagnosis present

## 2015-01-09 DIAGNOSIS — IMO0001 Reserved for inherently not codable concepts without codable children: Secondary | ICD-10-CM

## 2015-01-09 HISTORY — DX: Urinary tract infection, site not specified: N39.0

## 2015-01-09 LAB — URINALYSIS, ROUTINE W REFLEX MICROSCOPIC
GLUCOSE, UA: 100 mg/dL — AB
Ketones, ur: 15 mg/dL — AB
Nitrite: NEGATIVE
PROTEIN: 100 mg/dL — AB
Specific Gravity, Urine: 1.015 (ref 1.005–1.030)
Urobilinogen, UA: 0.2 mg/dL (ref 0.0–1.0)
pH: 5 (ref 5.0–8.0)

## 2015-01-09 LAB — COMPREHENSIVE METABOLIC PANEL
ALBUMIN: 2.8 g/dL — AB (ref 3.5–5.0)
ALK PHOS: 69 U/L (ref 38–126)
ALT: 17 U/L (ref 14–54)
ANION GAP: 14 (ref 5–15)
AST: 16 U/L (ref 15–41)
BUN: 23 mg/dL — AB (ref 6–20)
CALCIUM: 8.3 mg/dL — AB (ref 8.9–10.3)
CO2: 20 mmol/L — AB (ref 22–32)
Chloride: 102 mmol/L (ref 101–111)
Creatinine, Ser: 2.39 mg/dL — ABNORMAL HIGH (ref 0.44–1.00)
GFR calc Af Amer: 30 mL/min — ABNORMAL LOW (ref >60)
GFR calc non Af Amer: 26 mL/min — ABNORMAL LOW (ref >60)
GLUCOSE: 279 mg/dL — AB (ref 65–99)
Potassium: 3.8 mmol/L (ref 3.5–5.1)
SODIUM: 136 mmol/L (ref 135–145)
Total Bilirubin: 0.7 mg/dL (ref 0.3–1.2)
Total Protein: 6.8 g/dL (ref 6.5–8.1)

## 2015-01-09 LAB — CBC
HCT: 29.3 % — ABNORMAL LOW (ref 36.0–46.0)
HEMOGLOBIN: 9.3 g/dL — AB (ref 12.0–15.0)
MCH: 28.5 pg (ref 26.0–34.0)
MCHC: 31.7 g/dL (ref 30.0–36.0)
MCV: 89.9 fL (ref 78.0–100.0)
Platelets: 327 10*3/uL (ref 150–400)
RBC: 3.26 MIL/uL — ABNORMAL LOW (ref 3.87–5.11)
RDW: 13.8 % (ref 11.5–15.5)
WBC: 12.1 10*3/uL — ABNORMAL HIGH (ref 4.0–10.5)

## 2015-01-09 LAB — GLUCOSE, CAPILLARY
GLUCOSE-CAPILLARY: 254 mg/dL — AB (ref 65–99)
Glucose-Capillary: 233 mg/dL — ABNORMAL HIGH (ref 65–99)
Glucose-Capillary: 206 mg/dL — ABNORMAL HIGH (ref 65–99)
Glucose-Capillary: 107 mg/dL — ABNORMAL HIGH (ref 65–99)
Glucose-Capillary: 87 mg/dL (ref 65–99)

## 2015-01-09 LAB — RENAL FUNCTION PANEL
ANION GAP: 15 (ref 5–15)
Albumin: 2.7 g/dL — ABNORMAL LOW (ref 3.5–5.0)
BUN: 23 mg/dL — ABNORMAL HIGH (ref 6–20)
CHLORIDE: 102 mmol/L (ref 101–111)
CO2: 20 mmol/L — AB (ref 22–32)
Calcium: 8.2 mg/dL — ABNORMAL LOW (ref 8.9–10.3)
Creatinine, Ser: 2.31 mg/dL — ABNORMAL HIGH (ref 0.44–1.00)
GFR calc non Af Amer: 27 mL/min — ABNORMAL LOW (ref >60)
GFR, EST AFRICAN AMERICAN: 31 mL/min — AB (ref >60)
GLUCOSE: 272 mg/dL — AB (ref 65–99)
Phosphorus: 3.9 mg/dL (ref 2.5–4.6)
Potassium: 3.6 mmol/L (ref 3.5–5.1)
Sodium: 137 mmol/L (ref 135–145)

## 2015-01-09 LAB — URINE MICROSCOPIC-ADD ON

## 2015-01-09 LAB — IRON AND TIBC
IRON: 13 ug/dL — AB (ref 28–170)
SATURATION RATIOS: 8 % — AB (ref 10.4–31.8)
TIBC: 165 ug/dL — AB (ref 250–450)
UIBC: 152 ug/dL

## 2015-01-09 LAB — I-STAT CG4 LACTIC ACID, ED: LACTIC ACID, VENOUS: 0.76 mmol/L (ref 0.5–2.0)

## 2015-01-09 LAB — CBG MONITORING, ED: Glucose-Capillary: 257 mg/dL — ABNORMAL HIGH (ref 65–99)

## 2015-01-09 LAB — POCT HEMOGLOBIN-HEMACUE: Hemoglobin: 9.1 g/dL — ABNORMAL LOW (ref 12.0–15.0)

## 2015-01-09 LAB — I-STAT BETA HCG BLOOD, ED (MC, WL, AP ONLY): I-stat hCG, quantitative: 9.3 m[IU]/mL — ABNORMAL HIGH (ref <5)

## 2015-01-09 LAB — FERRITIN: Ferritin: 3117 ng/mL — ABNORMAL HIGH (ref 11–307)

## 2015-01-09 LAB — LIPASE, BLOOD: Lipase: 22 U/L (ref 11–51)

## 2015-01-09 MED ORDER — SODIUM CHLORIDE 0.9 % IV SOLN
INTRAVENOUS | Status: DC
  Administered 2015-01-09: 1.9 [IU]/h via INTRAVENOUS
  Administered 2015-01-09: 5 [IU]/h via INTRAVENOUS
  Administered 2015-01-09: 3.5 [IU]/h via INTRAVENOUS
  Administered 2015-01-10: 2.8 [IU]/h via INTRAVENOUS
  Filled 2015-01-09: qty 2.5

## 2015-01-09 MED ORDER — PREDNISONE 5 MG PO TABS
5.0000 mg | ORAL_TABLET | Freq: Every day | ORAL | Status: DC
Start: 2015-01-09 — End: 2015-01-12
  Administered 2015-01-09 – 2015-01-11 (×3): 5 mg via ORAL
  Filled 2015-01-09 (×3): qty 1

## 2015-01-09 MED ORDER — MYCOPHENOLATE SODIUM 180 MG PO TBEC
720.0000 mg | DELAYED_RELEASE_TABLET | Freq: Two times a day (BID) | ORAL | Status: DC
  Administered 2015-01-09 – 2015-01-12 (×6): 720 mg via ORAL
  Filled 2015-01-09 (×7): qty 4

## 2015-01-09 MED ORDER — ONDANSETRON HCL 4 MG/2ML IJ SOLN
4.0000 mg | Freq: Four times a day (QID) | INTRAMUSCULAR | Status: DC | PRN
  Administered 2015-01-10 – 2015-01-11 (×3): 4 mg via INTRAVENOUS
  Filled 2015-01-09 (×3): qty 2

## 2015-01-09 MED ORDER — EPOETIN ALFA 40000 UNIT/ML IJ SOLN: 30000.0000 [IU] | INTRAMUSCULAR | Status: DC

## 2015-01-09 MED ORDER — ENOXAPARIN SODIUM 30 MG/0.3ML ~~LOC~~ SOLN
30.0000 mg | SUBCUTANEOUS | Status: DC
Start: 2015-01-09 — End: 2015-01-12
  Administered 2015-01-09 – 2015-01-11 (×3): 30 mg via SUBCUTANEOUS
  Filled 2015-01-09 (×3): qty 0.3

## 2015-01-09 MED ORDER — HYDROXYZINE HCL 25 MG PO TABS: 25.0000 mg | ORAL_TABLET | Freq: Two times a day (BID) | ORAL | Status: DC | PRN

## 2015-01-09 MED ORDER — FAMOTIDINE 20 MG PO TABS
20.0000 mg | ORAL_TABLET | Freq: Every day | ORAL | Status: DC
  Administered 2015-01-09 – 2015-01-12 (×4): 20 mg via ORAL
  Filled 2015-01-09 (×4): qty 1

## 2015-01-09 MED ORDER — TACROLIMUS 1 MG PO CAPS
2.0000 mg | ORAL_CAPSULE | Freq: Two times a day (BID) | ORAL | Status: DC
  Administered 2015-01-09 – 2015-01-12 (×6): 2 mg via ORAL
  Filled 2015-01-09 (×6): qty 2

## 2015-01-09 MED ORDER — HYDROMORPHONE HCL 1 MG/ML IJ SOLN
1.0000 mg | INTRAMUSCULAR | Status: DC | PRN
  Administered 2015-01-10 – 2015-01-12 (×5): 1 mg via INTRAVENOUS
  Filled 2015-01-09 (×6): qty 1

## 2015-01-09 MED ORDER — SODIUM CHLORIDE 0.9 % IV BOLUS (SEPSIS)
1000.0000 mL | Freq: Once | INTRAVENOUS | Status: AC
  Administered 2015-01-09: 1000 mL via INTRAVENOUS

## 2015-01-09 MED ORDER — EPOETIN ALFA 10000 UNIT/ML IJ SOLN
INTRAMUSCULAR | Status: AC
  Administered 2015-01-09: 10000 [IU] via SUBCUTANEOUS
  Filled 2015-01-09: qty 1

## 2015-01-09 MED ORDER — DEXTROSE 50 % IV SOLN: 25.0000 mL | INTRAVENOUS | Status: DC | PRN

## 2015-01-09 MED ORDER — EPOETIN ALFA 20000 UNIT/ML IJ SOLN
INTRAMUSCULAR | Status: AC
  Administered 2015-01-09: 20000 [IU] via SUBCUTANEOUS
  Filled 2015-01-09: qty 1

## 2015-01-09 MED ORDER — ONDANSETRON HCL 4 MG/2ML IJ SOLN
4.0000 mg | Freq: Once | INTRAMUSCULAR | Status: AC
  Administered 2015-01-09: 4 mg via INTRAVENOUS
  Filled 2015-01-09: qty 2

## 2015-01-09 MED ORDER — DEXTROSE 5 % IV SOLN
1.0000 g | Freq: Once | INTRAVENOUS | Status: AC
  Administered 2015-01-09: 1 g via INTRAVENOUS
  Filled 2015-01-09: qty 10

## 2015-01-09 MED ORDER — ONDANSETRON HCL 4 MG PO TABS: 4.0000 mg | ORAL_TABLET | Freq: Four times a day (QID) | ORAL | Status: DC | PRN

## 2015-01-09 MED ORDER — SODIUM CHLORIDE 0.9 % IV SOLN
INTRAVENOUS | Status: DC
  Administered 2015-01-09 – 2015-01-11 (×3): via INTRAVENOUS

## 2015-01-09 MED ORDER — ACETAMINOPHEN 325 MG PO TABS
650.0000 mg | ORAL_TABLET | Freq: Four times a day (QID) | ORAL | Status: DC | PRN
  Administered 2015-01-09 – 2015-01-11 (×2): 650 mg via ORAL
  Filled 2015-01-09: qty 2

## 2015-01-09 MED ORDER — INSULIN REGULAR BOLUS VIA INFUSION
0.0000 [IU] | Freq: Three times a day (TID) | INTRAVENOUS | Status: DC
  Filled 2015-01-09: qty 10

## 2015-01-09 MED ORDER — SODIUM CHLORIDE 0.9 % IV SOLN
INTRAVENOUS | Status: DC
  Filled 2015-01-09: qty 2.5

## 2015-01-09 MED ORDER — DEXTROSE 5 % IV SOLN
1.0000 g | INTRAVENOUS | Status: DC
  Administered 2015-01-10 – 2015-01-11 (×2): 1 g via INTRAVENOUS
  Filled 2015-01-09 (×3): qty 10

## 2015-01-09 MED ORDER — DEXTROSE-NACL 5-0.45 % IV SOLN
INTRAVENOUS | Status: DC
Start: 2015-01-09 — End: 2015-01-10
  Administered 2015-01-09 – 2015-01-10 (×2): via INTRAVENOUS

## 2015-01-09 MED ORDER — FENTANYL CITRATE (PF) 100 MCG/2ML IJ SOLN
50.0000 ug | Freq: Once | INTRAMUSCULAR | Status: AC
  Administered 2015-01-09: 50 ug via INTRAVENOUS
  Filled 2015-01-09: qty 2

## 2015-01-09 MED ORDER — SODIUM CHLORIDE 0.9 % IV SOLN
INTRAVENOUS | Status: DC
  Administered 2015-01-09: 18:00:00 via INTRAVENOUS

## 2015-01-09 NOTE — ED Notes (Signed)
PT is here with lower abdominal pain, vomiting, and is diabetic with kidney transplant.  Pt states she has not felt right since taking flu shot.  Just recently discharged from another hospital for dehydration.

## 2015-01-09 NOTE — Progress Notes (Signed)
Patient states she was in ED this week for dehydration and hyperglycemia. Is currently c/o nausea and vomiting throughout the night. States she will go to ED when she leaves here. vss. Volunteers were called to take patient in W/C to ED.

## 2015-01-09 NOTE — Progress Notes (Signed)
01/09/2015 5:17 PM  Pt admitted to 6E03 from ED.  Pt alert and oriented, up with standby assist.  Full assessment to EPIC, vitals stable except for temp of 101 orally.  Dr. Tamala Julian notified, orders received for Tylenol.  Tylenol administered per prn order.  Pt c/o 6/10 pain in mid upper abdomen.  Pain med given per prn order.  Pt's skin intact.  Low fall risk.  Pt oriented to room/unit, and was instructed on how to utilize the call bell, to which she verbalized understanding.  Will continue to monitor. Princella Pellegrini

## 2015-01-09 NOTE — ED Provider Notes (Signed)
CSN: [id number]     Arrival date & time 01/09/15  1119 History   First MD Initiated Contact with Patient 01/09/15 1200     Chief Complaint  Patient presents with  . Abdominal Pain  . Emesis     Patient is a 28 y.o. female presenting with abdominal pain and vomiting. The history is provided by the patient. No language interpreter was used.  Abdominal Pain Associated symptoms: vomiting   Emesis Associated symptoms: abdominal pain    [name] presents for evaluation of lower abdominal pain and vomiting. She developed lower abdominal pain 2 days ago just after discharge from the hospital. Yesterday she developed severe vomiting, unable to tolerate anything by mouth. She denies any fevers. She is having some diarrhea. She has a history of kidney transplant and has been vomiting all her rejection meds since yesterday. She denies any dysuria or change in her urine, no vaginal discharge.  Past Medical History  Diagnosis Date  . Chronic kidney disease   . Hypertension   . Hyperlipidemia   . Diabetes mellitus     Pt reports diagnosis in June 2011   Past Surgical History  Procedure Laterality Date  . Kidney transplant  2007  . Thyroid lobectomy    . Renal biopsy Bilateral 2003   Family History  Problem Relation Age of Onset  . Arthritis Mother   . Diabetes Mother   . Hypertension Mother    Social History  Substance Use Topics  . Smoking status: Never Smoker   . Smokeless tobacco: Never Used  . Alcohol Use: No   OB History    No data available     Review of Systems  Gastrointestinal: Positive for vomiting and abdominal pain.  All other systems reviewed and are negative.     Allergies  Motrin; Banana; Diphenhydramine hcl; Iron dextran; and Shellfish allergy  Home Medications   Prior to Admission medications   Medication Sig Start Date End Date Taking? Authorizing Provider  acetaminophen (TYLENOL) 500 MG tablet Take 1,000 mg by mouth every 6 (six) hours as needed for  mild pain.    Historical Provider, MD  calcium carbonate (TUMS) 500 MG chewable tablet Chew 2 tablets by mouth 3 (three) times a week. Takes on Monday, wednesdays, and [place]    Historical Provider, MD  glucose blood (BAYER CONTOUR NEXT TEST) test strip Check 8 times per day, and lancets 11/28/14   Renato Shin, MD  hydrOXYzine (ATARAX) 25 MG tablet Take 25 mg by mouth 2 (two) times daily as needed for itching.     Historical Provider, MD  mycophenolate (MYFORTIC) 360 MG TBEC Take 720 mg by mouth 2 (two) times daily.     Historical Provider, MD  NOVOLOG 100 UNIT/ML injection For use in pump, total of 80 units per day. 12/25/14   Renato Shin, MD  POLY-IRON 150 150 MG capsule Take 150 mg by mouth 2 (two) times daily as needed (supplement).  03/20/14   Historical Provider, MD  predniSONE (DELTASONE) 5 MG tablet Take 5 mg by mouth at bedtime.     Historical Provider, MD  promethazine (PHENERGAN) 12.5 MG tablet Take 1 tablet (12.5 mg total) by mouth every 6 (six) hours as needed for nausea or vomiting. Patient not taking: Reported on 01/05/2015 10/01/14   Ripudeep Krystal Eaton, MD  ranitidine (ZANTAC) 150 MG tablet Take 1 tablet (150 mg total) by mouth daily as needed for heartburn. 06/25/14   Kinnie Feil, MD  simvastatin (ZOCOR) 20  MG tablet Take 20 mg by mouth every evening.    Historical Provider, MD  tacrolimus (PROGRAF) 1 MG capsule Take 2 mg by mouth 2 (two) times daily.     Historical Provider, MD   BP 107/66 mmHg  Pulse 108  Temp(Src) 99.6 F (37.6 C) (Oral)  Resp 18  Wt 156 lb 6.4 oz (70.943 kg)  SpO2 99% Physical Exam  Constitutional: She is oriented to person, place, and time. She appears well-developed and well-nourished. She appears distressed.  HENT:  Head: Normocephalic and atraumatic.  Dry mucous membranes  Cardiovascular: Regular rhythm.   No murmur heard. Tachycardic  Pulmonary/Chest: Effort normal and breath sounds normal. No respiratory distress.  Abdominal: Soft. There is no  rebound and no guarding.  Mild lower abdominal tenderness  Musculoskeletal: She exhibits no edema or tenderness.  Neurological: She is alert and oriented to person, place, and time.  Skin: Skin is warm and dry.  Psychiatric: She has a normal mood and affect. Her behavior is normal.  Nursing note and vitals reviewed.   ED Course  Procedures (including critical care time) Labs Review Labs Reviewed  COMPREHENSIVE METABOLIC PANEL - Abnormal; Notable for the following:    CO2 20 (*)    Glucose, Bld 279 (*)    BUN 23 (*)    Creatinine, Ser 2.39 (*)    Calcium 8.3 (*)    Albumin 2.8 (*)    GFR calc non Af Amer 26 (*)    GFR calc Af Amer 30 (*)    All other components within normal limits  CBC - Abnormal; Notable for the following:    WBC 12.1 (*)    RBC 3.26 (*)    Hemoglobin 9.3 (*)    HCT 29.3 (*)    All other components within normal limits  URINALYSIS, ROUTINE W REFLEX MICROSCOPIC (NOT AT [facility]) - Abnormal; Notable for the following:    APPearance TURBID (*)    Glucose, UA 100 (*)    Hgb urine dipstick TRACE (*)    Bilirubin Urine SMALL (*)    Ketones, ur 15 (*)    Protein, ur 100 (*)    Leukocytes, UA SMALL (*)    All other components within normal limits  URINE MICROSCOPIC-ADD ON - Abnormal; Notable for the following:    Squamous Epithelial / LPF FEW (*)    Bacteria, UA MANY (*)    All other components within normal limits  BASIC METABOLIC PANEL - Abnormal; Notable for the following:    CO2 18 (*)    Glucose, Bld 124 (*)    BUN 24 (*)    Creatinine, Ser 2.45 (*)    Calcium 7.5 (*)    GFR calc non Af Amer 25 (*)    GFR calc Af Amer 29 (*)    All other components within normal limits  CBC - Abnormal; Notable for the following:    RBC 2.58 (*)    Hemoglobin 7.6 (*)    HCT 23.3 (*)    All other components within normal limits  GLUCOSE, CAPILLARY - Abnormal; Notable for the following:    Glucose-Capillary 254 (*)    All other components within normal limits   GLUCOSE, CAPILLARY - Abnormal; Notable for the following:    Glucose-Capillary 233 (*)    All other components within normal limits  GLUCOSE, CAPILLARY - Abnormal; Notable for the following:    Glucose-Capillary 206 (*)    All other components within normal limits  GLUCOSE, CAPILLARY -  Abnormal; Notable for the following:    Glucose-Capillary 107 (*)    All other components within normal limits  GLUCOSE, CAPILLARY - Abnormal; Notable for the following:    Glucose-Capillary 181 (*)    All other components within normal limits  GLUCOSE, CAPILLARY - Abnormal; Notable for the following:    Glucose-Capillary 183 (*)    All other components within normal limits  GLUCOSE, CAPILLARY - Abnormal; Notable for the following:    Glucose-Capillary 189 (*)    All other components within normal limits  GLUCOSE, CAPILLARY - Abnormal; Notable for the following:    Glucose-Capillary 109 (*)    All other components within normal limits  GLUCOSE, CAPILLARY - Abnormal; Notable for the following:    Glucose-Capillary 121 (*)    All other components within normal limits  GLUCOSE, CAPILLARY - Abnormal; Notable for the following:    Glucose-Capillary 131 (*)    All other components within normal limits  GLUCOSE, CAPILLARY - Abnormal; Notable for the following:    Glucose-Capillary 162 (*)    All other components within normal limits  GLUCOSE, CAPILLARY - Abnormal; Notable for the following:    Glucose-Capillary 200 (*)    All other components within normal limits  GLUCOSE, CAPILLARY - Abnormal; Notable for the following:    Glucose-Capillary 179 (*)    All other components within normal limits  GLUCOSE, CAPILLARY - Abnormal; Notable for the following:    Glucose-Capillary 211 (*)    All other components within normal limits  GLUCOSE, CAPILLARY - Abnormal; Notable for the following:    Glucose-Capillary 195 (*)    All other components within normal limits  I-STAT BETA HCG BLOOD, ED ([facility], [facility], [facility] ONLY)  - Abnormal; Notable for the following:    I-stat hCG, quantitative 9.3 (*)    All other components within normal limits  CBG MONITORING, ED - Abnormal; Notable for the following:    Glucose-Capillary 257 (*)    All other components within normal limits  URINE CULTURE  CULTURE, BLOOD (ROUTINE X 2)  CULTURE, BLOOD (ROUTINE X 2)  LIPASE, BLOOD  GLUCOSE, CAPILLARY  I-STAT CG4 LACTIC ACID, ED    Imaging Review No results found. I have personally reviewed and evaluated these images and lab results as part of my medical decision-making.   EKG Interpretation None      MDM   Final diagnoses:  Acute UTI  Non-intractable vomiting with nausea, vomiting of unspecified type    Patient here for evaluation of lower abdominal pain, vomiting. She has a history of renal transplant and chronic renal insufficiency as well as diabetes. Patient dehydrated on initial evaluation. Her pain did improve after pain meds in the emergency Department. UA is consistent with UTI, treated with antibiotics.    Quintella Reichert, MD 01/10/15 1148

## 2015-01-09 NOTE — H&P (Signed)
[facility] History and Physical  [name] DOB: 1986-02-25 DOA: 01/09/2015  Referring physician: Emergency Department PCP: Elyn Peers, MD   CHIEF COMPLAINT: Lower abdominal pain, vomiting   HPI: [name] is a 29 y.o. female with Type two diabetes who is status post renal transplant 2007, on chronic immunosuppressants.   Patient was discharged from [facility] two days ago after being admitted with fever, cough, sore throat, dehydration and acute on chronic renal failure. Chest x-ray was unremarkable. Influenza negative. BK virus negative. Patient treated for dehydration / hyperglycemia. Renal ultrasound was unrevealing. UA revealed 21-50 WBCs, amorphous urates. Urine culture not obtained. Renal function improved.     Patient returns to the emergency department with mid upper abdominal pain and recurrent nausea / vomiting. Symptoms started the night she was discharged home form [name]. Patient states she has never had this pain before. It is constant, radiates through to her back. Patient does not know if food would aggravate the pain as she is unable to hold anything down. She is still urinating. No recent diarrhea (she reported diarrhea while hospitalized at [facility]). Patient is crying because of the pain. Fentanyl offered minimal relief. Zofran is helping her nausea  ED COURSE:     Labs:   BUN 23, creatinine 2.39. Lipase normal. LFTs normal. White blood cells 12.1, hemoglobin 9.3.  Lactic acid 0.76.    Urinalysis:   11-20 WBC, few squamous epithelial cells, 0-2 RBCs, negative nitrites, many bacteria, urine is turbid.    Medications  cefTRIAXone (ROCEPHIN) 1 g in dextrose 5 % 50 mL IVPB (not administered)  sodium chloride 0.9 % bolus 1,000 mL (1,000 mLs Intravenous New Bag/Given 01/09/15 1226)  ondansetron (ZOFRAN) injection 4 mg (4 mg Intravenous Given 01/09/15 1252)  fentaNYL (SUBLIMAZE) injection 50 mcg (50 mcg Intravenous Given  01/09/15 1252)     Review of Systems  Constitutional: Negative.   HENT: Negative.   Eyes: Negative.   Respiratory: Negative.   Cardiovascular: Negative.   Gastrointestinal: Positive for nausea, vomiting and abdominal pain.  Genitourinary: Negative.   Skin: Negative.   Neurological: Negative.   Endo/Heme/Allergies: Negative.   Psychiatric/Behavioral: Negative.     Past Medical History  Diagnosis Date  . Chronic kidney disease   . Hypertension   . Hyperlipidemia   . Diabetes mellitus     Pt reports diagnosis in June 2011   Past Surgical History  Procedure Laterality Date  . Kidney transplant  2007  . Thyroid lobectomy    . Renal biopsy Bilateral 2003    SOCIAL HISTORY:  reports that she has never smoked. She has never used smokeless tobacco. She reports that she does not drink alcohol or use illicit drugs. Lives:  at home alone Assistive devices:   None needed for ambulation.   Allergies  Allergen Reactions  . Motrin [Ibuprofen] Shortness Of Breath and Itching    Per pt  . Banana Other (See Comments)    Sick on the stomach  . Diphenhydramine Hcl     REACTION: Stopped breathing in ICU  . Iron Dextran     REACTION: vein irritation  . Shellfish Allergy Hives    Family History  Problem Relation Age of Onset  . Arthritis Mother   . Diabetes Mother   . Hypertension Mother     Prior to Admission medications   Medication Sig Start Date End Date Taking? Authorizing Provider  acetaminophen (TYLENOL) 500 MG tablet Take 1,000 mg by mouth every  6 (six) hours as needed for mild pain.   Yes Historical Provider, MD  calcium carbonate (TUMS) 500 MG chewable tablet Chew 2 tablets by mouth 3 (three) times a week. Takes on Monday, wednesdays, and fridays   Yes Historical Provider, MD  lisinopril-hydrochlorothiazide (PRINZIDE,ZESTORETIC) 20-25 MG tablet Take 1 tablet by mouth daily as needed (blood pressure).  11/14/14  Yes Historical Provider, MD  mycophenolate (MYFORTIC) 360 MG  TBEC Take 720 mg by mouth 2 (two) times daily.    Yes Historical Provider, MD  NOVOLOG 100 UNIT/ML injection For use in pump, total of 80 units per day. 12/25/14  Yes Renato Shin, MD  predniSONE (DELTASONE) 5 MG tablet Take 5 mg by mouth at bedtime.    Yes Historical Provider, MD  ranitidine (ZANTAC) 150 MG tablet Take 1 tablet (150 mg total) by mouth daily as needed for heartburn. 06/25/14  Yes Kinnie Feil, MD  simvastatin (ZOCOR) 20 MG tablet Take 20 mg by mouth every evening.   Yes Historical Provider, MD  tacrolimus (PROGRAF) 1 MG capsule Take 2 mg by mouth 2 (two) times daily.    Yes Historical Provider, MD  glucose blood (BAYER CONTOUR NEXT TEST) test strip Check 8 times per day, and lancets 11/28/14   Renato Shin, MD  hydrOXYzine (ATARAX) 25 MG tablet Take 25 mg by mouth 2 (two) times daily as needed for itching.     Historical Provider, MD  promethazine (PHENERGAN) 12.5 MG tablet Take 1 tablet (12.5 mg total) by mouth every 6 (six) hours as needed for nausea or vomiting. Patient not taking: Reported on 01/05/2015 10/01/14   Ripudeep Krystal Eaton, MD   PHYSICAL EXAM: Filed Vitals:   01/09/15 1230 01/09/15 1315 01/09/15 1430 01/09/15 1500  BP: 110/78 104/63 117/74 118/78  Pulse: 102 97 95 96  Temp:      TempSrc:      Resp:      Weight:      SpO2: 100% 97% 96% 96%    Wt Readings from Last 3 Encounters:  01/09/15 70.943 kg (156 lb 6.4 oz)  01/05/15 70.761 kg (156 lb)  12/25/14 72.576 kg (160 lb)    General:  Pleasant black female. Appears calm but crying because of abdominal pain. comfortable Eyes: PER, normal lids, irises & conjunctiva ENT: grossly normal hearing, lips & tongue Neck: no LAD, no masses Cardiovascular: RRR, No LE edema.  Respiratory: Respirations even and unlabored. Normal respiratory effort. Lungs CTA bilaterally, no wheezes / rales .   Abdomen: soft, non-distended, active bowel sounds. Moderate mid upper tenderness to palpation. No obvious masses.  Skin: no rash  seen on limited exam Musculoskeletal: grossly normal tone BUE/BLE Psychiatric: grossly normal mood and affect, speech fluent and appropriate Neurologic: grossly non-focal.         LABS ON ADMISSION:    Basic Metabolic Panel:  Recent Labs Lab 01/05/15 1910 01/06/15 0550 01/07/15 0800 01/09/15 1100 01/09/15 1154  NA 131* 137 137 137 136  K 3.6 4.2 4.2 3.6 3.8  CL 102 106 108 102 102  CO2 20* 22 20* 20* 20*  GLUCOSE 373* 171* 233* 272* 279*  BUN 49* 48* 43* 23* 23*  CREATININE 2.71* 2.47* 2.20* 2.31* 2.39*  CALCIUM 8.2* 8.7* 8.2* 8.2* 8.3*  PHOS  --   --  3.6 3.9  --    Liver Function Tests:  Recent Labs Lab 01/06/15 0550 01/07/15 0800 01/09/15 1100 01/09/15 1154  AST 22  --   --  16  ALT 15  --   --  17  ALKPHOS 68  --   --  69  BILITOT 0.5  --   --  0.7  PROT 7.6  --   --  6.8  ALBUMIN 3.1* 2.9* 2.7* 2.8*    Recent Labs Lab 01/09/15 1154  LIPASE 22    CBC:  Recent Labs Lab 01/05/15 1451 01/06/15 0550 01/09/15 1111 01/09/15 1154  WBC 11.8* 8.3  --  12.1*  NEUTROABS  --  6.6  --   --   HGB 10.1* 9.7* 9.1* 9.3*  HCT 31.0* 28.8*  --  29.3*  MCV 88.6 87.8  --  89.9  PLT 295 297  --  327   Cardiac Enzymes:  Recent Labs Lab 01/06/15 0550  CKTOTAL 25*     CBG:  Recent Labs Lab 01/06/15 2028 01/07/15 0538 01/07/15 0740 01/07/15 1202 01/09/15 1133  GLUCAP 223* 266* 228* 121* 257*    ASSESSMENT / PLAN   Principal Problem:   UTI (urinary tract infection) Active Problems:   Abdominal pain   Acute on chronic kidney failure (HCC)   Essential hypertension, benign   KIDNEY TRANSPLANTATION, HX OF   Diabetes (HCC)   Immunosuppressed status (HCC)   Nausea and vomiting   Abdominal pain (mid upper), mid associated with nausea, vomiting. Urinalysis suspicious for UTI though her pain seems higher up in abdomen than one would expect with UTI. She does have a history of UTI / transplant pyelonephritis -send urine for culture -Continue Rocephin  initiated in ED -KUB today -Fentanyl not helping pain, switch to Dilaudid prn -continue prn Zofran which is helping  Acute on CKD (renal transplant recipient). Creatinine was improving at [facility] a few days ago. Suspect recurrent volume depletion causing decline in renal function again.  -Volume repletion via IVF -Continue renal transplant medications  DM2.  She has a pump. Not eating at present secondary to nausea and vomiting.  -Typically on 80 units / day of Humalog via pump. She doesn't have pump supplies with her so will discontinue pump and place on insulin drip. Titrate to keep CBGs less than 160.  -consult diabetes coordinator to assist with pump when supplies available.  -carb modified diet.    Anemia of chronic renal disease. She gets Procrit injections. Hgb stable but overall down to 9.3 from baseline of mid 11.    CONSULTANTS:   none Code Status: full DVT Prophylaxis: Low dose Lovenox Family Communication:   Patient alert, oriented and understands plan of care.  Disposition Plan: Discharge to home in 24-48 hours   Time spent: 60 minutes Tye Savoy  NP [facility] Pager [phone]

## 2015-01-10 DIAGNOSIS — N390 Urinary tract infection, site not specified: Secondary | ICD-10-CM | POA: Insufficient documentation

## 2015-01-10 DIAGNOSIS — N189 Chronic kidney disease, unspecified: Secondary | ICD-10-CM

## 2015-01-10 DIAGNOSIS — Z8249 Family history of ischemic heart disease and other diseases of the circulatory system: Secondary | ICD-10-CM | POA: Diagnosis not present

## 2015-01-10 DIAGNOSIS — Z79899 Other long term (current) drug therapy: Secondary | ICD-10-CM | POA: Diagnosis not present

## 2015-01-10 DIAGNOSIS — E890 Postprocedural hypothyroidism: Secondary | ICD-10-CM | POA: Diagnosis present

## 2015-01-10 DIAGNOSIS — D631 Anemia in chronic kidney disease: Secondary | ICD-10-CM

## 2015-01-10 DIAGNOSIS — N179 Acute kidney failure, unspecified: Secondary | ICD-10-CM

## 2015-01-10 DIAGNOSIS — I129 Hypertensive chronic kidney disease with stage 1 through stage 4 chronic kidney disease, or unspecified chronic kidney disease: Secondary | ICD-10-CM | POA: Diagnosis present

## 2015-01-10 DIAGNOSIS — K219 Gastro-esophageal reflux disease without esophagitis: Secondary | ICD-10-CM | POA: Diagnosis present

## 2015-01-10 DIAGNOSIS — E1122 Type 2 diabetes mellitus with diabetic chronic kidney disease: Secondary | ICD-10-CM | POA: Diagnosis present

## 2015-01-10 DIAGNOSIS — E785 Hyperlipidemia, unspecified: Secondary | ICD-10-CM | POA: Diagnosis present

## 2015-01-10 DIAGNOSIS — A419 Sepsis, unspecified organism: Secondary | ICD-10-CM | POA: Diagnosis present

## 2015-01-10 DIAGNOSIS — Z833 Family history of diabetes mellitus: Secondary | ICD-10-CM | POA: Diagnosis not present

## 2015-01-10 DIAGNOSIS — R1010 Upper abdominal pain, unspecified: Secondary | ICD-10-CM

## 2015-01-10 DIAGNOSIS — E860 Dehydration: Secondary | ICD-10-CM | POA: Diagnosis present

## 2015-01-10 DIAGNOSIS — Z940 Kidney transplant status: Secondary | ICD-10-CM | POA: Diagnosis not present

## 2015-01-10 DIAGNOSIS — Z794 Long term (current) use of insulin: Secondary | ICD-10-CM | POA: Diagnosis not present

## 2015-01-10 DIAGNOSIS — R112 Nausea with vomiting, unspecified: Secondary | ICD-10-CM | POA: Insufficient documentation

## 2015-01-10 DIAGNOSIS — G43A0 Cyclical vomiting, not intractable: Secondary | ICD-10-CM

## 2015-01-10 DIAGNOSIS — R1110 Vomiting, unspecified: Secondary | ICD-10-CM | POA: Insufficient documentation

## 2015-01-10 DIAGNOSIS — Z9889 Other specified postprocedural states: Secondary | ICD-10-CM | POA: Diagnosis not present

## 2015-01-10 DIAGNOSIS — F502 Bulimia nervosa: Secondary | ICD-10-CM | POA: Diagnosis not present

## 2015-01-10 DIAGNOSIS — Z8261 Family history of arthritis: Secondary | ICD-10-CM | POA: Diagnosis not present

## 2015-01-10 DIAGNOSIS — N12 Tubulo-interstitial nephritis, not specified as acute or chronic: Secondary | ICD-10-CM | POA: Diagnosis present

## 2015-01-10 DIAGNOSIS — E119 Type 2 diabetes mellitus without complications: Secondary | ICD-10-CM | POA: Diagnosis present

## 2015-01-10 LAB — BASIC METABOLIC PANEL
ANION GAP: 15 (ref 5–15)
BUN: 24 mg/dL — ABNORMAL HIGH (ref 6–20)
CO2: 18 mmol/L — AB (ref 22–32)
Calcium: 7.5 mg/dL — ABNORMAL LOW (ref 8.9–10.3)
Chloride: 104 mmol/L (ref 101–111)
Creatinine, Ser: 2.45 mg/dL — ABNORMAL HIGH (ref 0.44–1.00)
GFR calc Af Amer: 29 mL/min — ABNORMAL LOW (ref >60)
GFR calc non Af Amer: 25 mL/min — ABNORMAL LOW (ref >60)
GLUCOSE: 124 mg/dL — AB (ref 65–99)
POTASSIUM: 3.5 mmol/L (ref 3.5–5.1)
Sodium: 137 mmol/L (ref 135–145)

## 2015-01-10 LAB — GLUCOSE, CAPILLARY
GLUCOSE-CAPILLARY: 121 mg/dL — AB (ref 65–99)
GLUCOSE-CAPILLARY: 131 mg/dL — AB (ref 65–99)
GLUCOSE-CAPILLARY: 179 mg/dL — AB (ref 65–99)
GLUCOSE-CAPILLARY: 181 mg/dL — AB (ref 65–99)
GLUCOSE-CAPILLARY: 183 mg/dL — AB (ref 65–99)
GLUCOSE-CAPILLARY: 189 mg/dL — AB (ref 65–99)
GLUCOSE-CAPILLARY: 195 mg/dL — AB (ref 65–99)
GLUCOSE-CAPILLARY: 211 mg/dL — AB (ref 65–99)
GLUCOSE-CAPILLARY: 218 mg/dL — AB (ref 65–99)
Glucose-Capillary: 109 mg/dL — ABNORMAL HIGH (ref 65–99)
Glucose-Capillary: 162 mg/dL — ABNORMAL HIGH (ref 65–99)
Glucose-Capillary: 200 mg/dL — ABNORMAL HIGH (ref 65–99)
Glucose-Capillary: 179 mg/dL — ABNORMAL HIGH (ref 65–99)
Glucose-Capillary: 156 mg/dL — ABNORMAL HIGH (ref 65–99)

## 2015-01-10 LAB — CBC
HEMATOCRIT: 23.3 % — AB (ref 36.0–46.0)
HEMOGLOBIN: 7.6 g/dL — AB (ref 12.0–15.0)
MCH: 29.5 pg (ref 26.0–34.0)
MCHC: 32.6 g/dL (ref 30.0–36.0)
MCV: 90.3 fL (ref 78.0–100.0)
Platelets: 299 10*3/uL (ref 150–400)
RBC: 2.58 MIL/uL — ABNORMAL LOW (ref 3.87–5.11)
RDW: 13.9 % (ref 11.5–15.5)
WBC: 9.6 10*3/uL (ref 4.0–10.5)

## 2015-01-10 LAB — CULTURE, BLOOD (ROUTINE X 2)
CULTURE: NO GROWTH
Culture: NO GROWTH

## 2015-01-10 MED ORDER — INSULIN DETEMIR 100 UNIT/ML ~~LOC~~ SOLN
40.0000 [IU] | Freq: Every day | SUBCUTANEOUS | Status: DC
  Filled 2015-01-10: qty 0.4

## 2015-01-10 MED ORDER — INSULIN GLARGINE 100 UNIT/ML ~~LOC~~ SOLN
40.0000 [IU] | Freq: Every day | SUBCUTANEOUS | Status: DC
  Administered 2015-01-10 – 2015-01-11 (×2): 40 [IU] via SUBCUTANEOUS
  Filled 2015-01-10 (×3): qty 0.4

## 2015-01-10 MED ORDER — INSULIN ASPART 100 UNIT/ML ~~LOC~~ SOLN
3.0000 [IU] | Freq: Three times a day (TID) | SUBCUTANEOUS | Status: DC
  Administered 2015-01-11 – 2015-01-12 (×5): 3 [IU] via SUBCUTANEOUS

## 2015-01-10 MED ORDER — PANTOPRAZOLE SODIUM 20 MG PO TBEC
20.0000 mg | DELAYED_RELEASE_TABLET | Freq: Every day | ORAL | Status: DC
  Administered 2015-01-10 – 2015-01-12 (×3): 20 mg via ORAL
  Filled 2015-01-10 (×3): qty 1

## 2015-01-10 MED ORDER — INSULIN ASPART 100 UNIT/ML ~~LOC~~ SOLN
0.0000 [IU] | Freq: Three times a day (TID) | SUBCUTANEOUS | Status: DC
  Administered 2015-01-10 (×2): 2 [IU] via SUBCUTANEOUS
  Administered 2015-01-11: 7 [IU] via SUBCUTANEOUS
  Administered 2015-01-11: 5 [IU] via SUBCUTANEOUS
  Administered 2015-01-12: 3 [IU] via SUBCUTANEOUS
  Administered 2015-01-12: 2 [IU] via SUBCUTANEOUS

## 2015-01-10 MED ORDER — GLUCERNA SHAKE PO LIQD
237.0000 mL | Freq: Three times a day (TID) | ORAL | Status: DC
  Administered 2015-01-10 – 2015-01-12 (×6): 237 mL via ORAL

## 2015-01-10 NOTE — Consult Note (Signed)
   [facility], [facility] Inpatient Consult   01/10/2015  GEANA LELLA 1985/09/03 [id number] Patient evaluated for community based chronic disease management services with [place] [facility] Program as a benefit of patient's Medicare Insurance. Spoke with patient at bedside to explain [place] Management services. Patient endorses that Dr. Lu Duffel is her primary care provider.  Consent form signed.   Patient will receive post discharge follow up calls and will be evaluated for monthly home visits for assessments and disease process education.  Left contact information and [facility] literature at bedside. Made Inpatient Case Manager aware that [place] Management following. Of note, [facility] Management services does not replace or interfere with any services that are arranged by inpatient case management or social work.  For additional questions or referrals please contact:   Natividad Brood, RN BSN [facility] Liaison  [phone] business mobile phone

## 2015-01-10 NOTE — Progress Notes (Signed)
[name] [id number] DOB: Aug 11, 1985 DOA: 01/09/2015 PCP: Elyn Peers, MD  Brief narrative: 29 y/o ? 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/prograf IDDM on insulin pump AoCD  Admitted back to [facility] 01/09/15 with purported lower back pain, N/V and concern for pyelo Note that bf has been ill with URI symptoms  Past medical history-As per Problem list Chart reviewed as below-   Consultants:  nephro  Procedures:  none  Antibiotics:  Ceftriaxone 10/27   Subjective   Feels better than on admit Main issue central abd pain Some n this am tol diet Doesn't have enough insulin in insulin pump No current fever   Objective    Interim History:   Telemetry:    Objective: Filed Vitals:   01/09/15 2138 01/09/15 2140 01/10/15 0426 01/10/15 0803  BP: 90/56 95/60 90/53  101/62  Pulse:   93 89  Temp:   98.9 F (37.2 C) 99 F (37.2 C)  TempSrc:    Oral  Resp:   18 18  Weight:      SpO2:   96% 93%    Intake/Output Summary (Last 24 hours) at 01/10/15 1157 Last data filed at 01/10/15 1157  Gross per 24 hour  Intake 2178.04 ml  Output    300 ml  Net 1878.04 ml    Exam:  General: eomi ncat Cardiovascular: s1 s 2no m/r/g Respiratory: clear no added sound Abdomen: soft-epig tenderness Skin no le edema Neuroplethoric and cushingoid face  Data Reviewed: Basic Metabolic Panel:  Recent Labs Lab 01/06/15 0550 01/07/15 0800 01/09/15 1100 01/09/15 1154 01/10/15 0530  NA 137 137 137 136 137  K 4.2 4.2 3.6 3.8 3.5  CL 106 108 102 102 104  CO2 22 20* 20* 20* 18*  GLUCOSE 171* 233* 272* 279* 124*  BUN 48* 43* 23* 23* 24*  CREATININE 2.47* 2.20* 2.31* 2.39* 2.45*  CALCIUM 8.7* 8.2* 8.2* 8.3* 7.5*  PHOS  --  3.6 3.9  --   --    Liver Function Tests:  Recent Labs Lab 01/06/15 0550 01/07/15 0800 01/09/15 1100 01/09/15 1154  AST 22  --   --  16  ALT 15  --   --  17    ALKPHOS 68  --   --  69  BILITOT 0.5  --   --  0.7  PROT 7.6  --   --  6.8  ALBUMIN 3.1* 2.9* 2.7* 2.8*    Recent Labs Lab 01/09/15 1154  LIPASE 22   No results for input(s): AMMONIA in the last 168 hours. CBC:  Recent Labs Lab 01/05/15 1451 01/06/15 0550 01/09/15 1111 01/09/15 1154 01/10/15 0530  WBC 11.8* 8.3  --  12.1* 9.6  NEUTROABS  --  6.6  --   --   --   HGB 10.1* 9.7* 9.1* 9.3* 7.6*  HCT 31.0* 28.8*  --  29.3* 23.3*  MCV 88.6 87.8  --  89.9 90.3  PLT 295 297  --  327 299   Cardiac Enzymes:  Recent Labs Lab 01/06/15 0550  CKTOTAL 25*   BNP: Invalid input(s): POCBNP CBG:  Recent Labs Lab 01/10/15 0656 01/10/15 0758 01/10/15 0911 01/10/15 1037 01/10/15 1120  GLUCAP 162* 200* 179* 211* 195*    Recent Results (from the past 240 hour(s))  MRSA PCR Screening     Status: None   Collection Time: 01/05/15  1:45 PM  Result Value Ref Range Status   MRSA by PCR  NEGATIVE NEGATIVE Final    Comment:        The GeneXpert MRSA Assay (FDA approved for NASAL specimens only), is one component of a comprehensive MRSA colonization surveillance program. It is not intended to diagnose MRSA infection nor to guide or monitor treatment for MRSA infections.   Blood culture (routine x 2)     Status: None (Preliminary result)   Collection Time: 01/05/15  4:49 PM  Result Value Ref Range Status   Specimen Description BLOOD RIGHT ANTECUBITAL  Final   Special Requests BOTTLES DRAWN AEROBIC AND ANAEROBIC 5CC EACH  Final   Culture   Final    NO GROWTH 4 DAYS Performed at [facility]    Report Status PENDING  Incomplete  Blood culture (routine x 2)     Status: None (Preliminary result)   Collection Time: 01/05/15  5:00 PM  Result Value Ref Range Status   Specimen Description BLOOD RIGHT HAND  Final   Special Requests BOTTLES DRAWN AEROBIC AND ANAEROBIC 5 CC EACH  Final   Culture   Final    NO GROWTH 4 DAYS Performed at [facility]    Report  Status PENDING  Incomplete  Rapid strep screen (not at [facility])     Status: None   Collection Time: 01/05/15  7:00 PM  Result Value Ref Range Status   Streptococcus, Group A Screen (Direct) NEGATIVE NEGATIVE Final    Comment: (NOTE) A Rapid Antigen test may result negative if the antigen level in the sample is below the detection level of this test. The FDA has not cleared this test as a stand-alone test therefore the rapid antigen negative result has reflexed to a Group A Strep culture.   Culture, Group A Strep     Status: None   Collection Time: 01/05/15  7:00 PM  Result Value Ref Range Status   Strep A Culture Negative  Final    Comment: (NOTE) Performed At: [facility] [place], [place] [id number] Lindon Romp MD [id number]   Urine culture     Status: None (Preliminary result)   Collection Time: 01/09/15  1:33 PM  Result Value Ref Range Status   Specimen Description URINE, CLEAN CATCH  Final   Special Requests NONE  Final   Culture TOO YOUNG TO READ  Final   Report Status PENDING  Incomplete     Studies:              All Imaging reviewed and is as per above notation   Scheduled Meds: . cefTRIAXone (ROCEPHIN)  IV  1 g Intravenous Q24H  . enoxaparin (LOVENOX) injection  30 mg Subcutaneous Q24H  . famotidine  20 mg Oral Daily  . insulin aspart  0-9 Units Subcutaneous TID WC  . insulin aspart  3 Units Subcutaneous TID WC  . insulin glargine  40 Units Subcutaneous Daily  . mycophenolate  720 mg Oral BID  . predniSONE  5 mg Oral QHS  . tacrolimus  2 mg Oral BID   Continuous Infusions: . sodium chloride Stopped (01/09/15 1841)     Assessment/Plan:  1. Sepsis in patient on immmunosuppressants-source possible Pyelo-UC pending.  Monitor CBC.  Obtain prograf/cellcept to ensure not too immunosuppressed.  continue rocephin and narrow as appropriate. 2. 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  Keep on IV saline 75 cc/hr for now.  Rpt labs  in am. 3. Mild hypotension-probably constitutional.  Hold Lisinopril-hctz 1 tab going forward 4. DM  ty2 on pump-trasntion off of pump to Lantus 40 and ssi.  Needs to follow with endocrine for refilling.  CBg 150-200 range 5. Gerd-n/v-continue Famotidine 20 daily.  Added protonix 20 daily.  monitor  Code Status: full Family Communication:  No family + Disposition Plan:  inpatient    Verneita Griffes, MD  [facility] Pager [phone] 01/10/2015, 11:57 AM    LOS: 1 day

## 2015-01-10 NOTE — Progress Notes (Signed)
Initial Nutrition Assessment  DOCUMENTATION CODES:   Non-severe (moderate) malnutrition in context of acute illness/injury  INTERVENTION:  Provide Glucerna Shake po TID, each supplement provides 220 kcal and 10 grams of protein.  Encourage adequate PO intake.   NUTRITION DIAGNOSIS:   Inadequate oral intake related to nausea, acute illness as evidenced by meal completion < 25%.  GOAL:   Patient will meet greater than or equal to 90% of their needs  MONITOR:   PO intake, Supplement acceptance, Weight trends, Labs, I & O's  REASON FOR ASSESSMENT:   Malnutrition Screening Tool    ASSESSMENT:   29 y.o. female with a Past Medical History of diabetes mellitus type 2 and s/p renal transplant in 2007 on chronic immunosuppressants; who presents for lower abdominal pain and persistent nausea and vomiting. Patient was discharged from [facility] two days ago after being admitted with fever, cough, sore throat, dehydration and acute on chronic renal failure  Pt reports appetite has been improving. Meal completion has been 25%. Pt reports over the past week she has been experiencing n/v thus has not been eating much at meals, however pt reports she usually eats well with no other difficulties. Pt reports a couple of pounds weight loss over the past week. Per Epic weight records, pt with a 1.28% weight loss in 4 days. Pt is agreeable to nutritional supplements to aid in caloric and protein needs. RD to order. Pt encouraged to eat her food at meals.   Pt with no observed significant fat or muscle mass loss.   Labs and medications reviewed.   Diet Order:  Diet heart healthy/carb modified Room service appropriate?: Yes; Fluid consistency:: Thin  Skin:  Reviewed, no issues  Last BM:  10/27  Height:   Ht Readings from Last 1 Encounters:  01/05/15 5\' 6"  (1.676 m)    Weight:   Wt Readings from Last 1 Encounters:  01/09/15 154 lb 5.2 oz (70 kg)    Ideal Body Weight:  59  kg  BMI:  Body mass index is 24.92 kg/(m^2).  Estimated Nutritional Needs:   Kcal:  [id number]  Protein:  80-90 grams  Fluid:  1.8 - 2 L/day  EDUCATION NEEDS:   No education needs identified at this time  Corrin Parker, MS, RD, LDN Pager # [phone] After hours/ weekend pager # [phone]

## 2015-01-10 NOTE — Progress Notes (Signed)
Inpatient Diabetes Program Recommendations  AACE/ADA: New Consensus Statement on Inpatient Glycemic Control (2015)  Target Ranges:  Prepandial:   less than 140 mg/dL      Peak postprandial:   less than 180 mg/dL (1-2 hours)      Critically ill patients:  140 - 180 mg/dL    Results for Valerie, [name] (MRN [id number]) as of 01/10/2015 09:32  Ref. Range 01/09/2015 11:00  Sodium Latest Ref Range: 135-145 mmol/L 137  Potassium Latest Ref Range: 3.5-5.1 mmol/L 3.6  Chloride Latest Ref Range: 101-111 mmol/L 102  CO2 Latest Ref Range: 22-32 mmol/L 20 (L)  BUN Latest Ref Range: 6-20 mg/dL 23 (H)  Creatinine Latest Ref Range: 0.44-1.00 mg/dL 2.31 (H)  Calcium Latest Ref Range: 8.9-10.3 mg/dL 8.2 (L)  EGFR (Non-African Amer.) Latest Ref Range: >60 mL/min 27 (L)  EGFR (African American) Latest Ref Range: >60 mL/min 31 (L)  Glucose Latest Ref Range: 65-99 mg/dL 272 (H)  Anion gap Latest Ref Range: 5-15  15    Admit with: UTI/ Hyperglycemia/ Abdominal Pain/ N&V  History: DM, Renal Transplant  Home DM Meds: Insulin Pump  Current Insulin Orders: IV Insulin Drip    -Note patient was admitted last night.  Insulin pump removed and patient placed on IV Insulin drip.  -Spoke with patient this AM.  Patient does not have extra insulin pump supplies with her and does not have any friends or family that can go to her house and get more supplies.  Was able to review patient's insulin pump settings with her.  See below for insulin pump settings.  -Discussed with patient that the physician may start her on Lantus and Novolog here in the hospital since she does not have insulin pump supplies.  Educated patient on how Lantus works and that if we give her Lantus insulin, she will need to wait 24 hours to resume her basal rates on her insulin pump.  Instructed patient that if we give her Lantus, she should suspend her basal rates on her pump until 24 hours after Lantus given.  Patient stated she does not know  how to suspend her basal rates.  Alerted patient that we can give her a Rx for insulin syringes or she can buy some syringes OTC and use her Novolog for her boluses until she can resume her insulin pump at home.  Patient agreeable to this plan.  Alerted RN as well.   Basal Rates: Midnight- Midnight= 2.2 units/hour  Patient gets 52.8 units total basal insulin per 24 hour period  Carbohydrate Ratio: 1 unit insulin for every 10 grams Carbohydrates eaten by patient  Correction/Sensitivity Factor: 1 unit insulin for every 50 mg/dl above target CBG  Target CBG= 125 mg/dl     MD- Recommend we start patient on Lantus and Novolog when she is ready to transition off the IV insulin drip.  Recommend Lantus 42 units daily (this would be 80% of the total amount of basal insulin patient gets on her insulin pump)  Give 1st dose Lantus, continue IV insulin drip for 1 hour, then discontinue IV insulin drip  Novolog Sensitive SSI (0-9 units) TID AC + HS  Novolog 4 units tidwc (meal coverage)     --Will follow patient during hospitalization--  [name] RN, MSN, CDE Diabetes Coordinator Inpatient Glycemic Control Team Team Pager: [phone] (8a-5p)

## 2015-01-11 LAB — GLUCOSE, CAPILLARY
GLUCOSE-CAPILLARY: 91 mg/dL (ref 65–99)
GLUCOSE-CAPILLARY: 116 mg/dL — AB (ref 65–99)
Glucose-Capillary: 327 mg/dL — ABNORMAL HIGH (ref 65–99)
Glucose-Capillary: 293 mg/dL — ABNORMAL HIGH (ref 65–99)

## 2015-01-11 LAB — C DIFFICILE QUICK SCREEN W PCR REFLEX
C DIFFICILE (CDIFF) INTERP: NEGATIVE
C DIFFICILE (CDIFF) TOXIN: NEGATIVE
C DIFFICLE (CDIFF) ANTIGEN: NEGATIVE

## 2015-01-11 MED ORDER — INSULIN GLARGINE 100 UNIT/ML ~~LOC~~ SOLN
45.0000 [IU] | Freq: Every day | SUBCUTANEOUS | Status: DC
  Administered 2015-01-12: 45 [IU] via SUBCUTANEOUS
  Filled 2015-01-11: qty 0.45

## 2015-01-11 NOTE — Progress Notes (Signed)
[name] [id number] DOB: Aug 17, 1985 DOA: 01/09/2015 PCP: Elyn Peers, MD  Brief narrative:  29 y/o ? 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/prograf IDDM on insulin pump AoCD  Admitted back to [facility] 01/09/15 with purported lower back pain, N/V and concern for pyelo Note that bf has been ill with URI symptoms  Past medical history-As per Problem list Chart reviewed as below-   Consultants:  nephro  Procedures:  none  Antibiotics:  Ceftriaxone 10/27   Subjective   Fair wondering about her Myfortic dosing Feels a little better with regards to stomach discomfort No cp no chills no rigor   Objective    Interim History:   Telemetry:    Objective: Filed Vitals:   01/10/15 2029 01/11/15 0049 01/11/15 0448 01/11/15 0857  BP: 108/72 100/67 115/75 110/68  Pulse: 102  85 81  Temp: 99.4 F (37.4 C)  98.5 F (36.9 C) 98.7 F (37.1 C)  TempSrc: Oral  Oral Oral  Resp: 18  16 16   Weight: 73.12 kg (161 lb 3.2 oz)     SpO2: 96%  92% 95%    Intake/Output Summary (Last 24 hours) at 01/11/15 1300 Last data filed at 01/11/15 1134  Gross per 24 hour  Intake 1857.5 ml  Output   1000 ml  Net  857.5 ml    Exam:  General: eomi ncat Cardiovascular: s1 s 2no m/r/g Respiratory: clear no added sound Abdomen: soft-epig tenderness Skin no le edema Neuroplethoric and cushingoid face  Data Reviewed: Basic Metabolic Panel:  Recent Labs Lab 01/06/15 0550 01/07/15 0800 01/09/15 1100 01/09/15 1154 01/10/15 0530  NA 137 137 137 136 137  K 4.2 4.2 3.6 3.8 3.5  CL 106 108 102 102 104  CO2 22 20* 20* 20* 18*  GLUCOSE 171* 233* 272* 279* 124*  BUN 48* 43* 23* 23* 24*  CREATININE 2.47* 2.20* 2.31* 2.39* 2.45*  CALCIUM 8.7* 8.2* 8.2* 8.3* 7.5*  PHOS  --  3.6 3.9  --   --    Liver Function Tests:  Recent Labs Lab 01/06/15 0550 01/07/15 0800 01/09/15 1100 01/09/15 1154  AST 22   --   --  16  ALT 15  --   --  17  ALKPHOS 68  --   --  69  BILITOT 0.5  --   --  0.7  PROT 7.6  --   --  6.8  ALBUMIN 3.1* 2.9* 2.7* 2.8*    Recent Labs Lab 01/09/15 1154  LIPASE 22   No results for input(s): AMMONIA in the last 168 hours. CBC:  Recent Labs Lab 01/05/15 1451 01/06/15 0550 01/09/15 1111 01/09/15 1154 01/10/15 0530  WBC 11.8* 8.3  --  12.1* 9.6  NEUTROABS  --  6.6  --   --   --   HGB 10.1* 9.7* 9.1* 9.3* 7.6*  HCT 31.0* 28.8*  --  29.3* 23.3*  MCV 88.6 87.8  --  89.9 90.3  PLT 295 297  --  327 299   Cardiac Enzymes:  Recent Labs Lab 01/06/15 0550  CKTOTAL 25*   BNP: Invalid input(s): POCBNP CBG:  Recent Labs Lab 01/10/15 1213 01/10/15 1619 01/10/15 2027 01/11/15 0817 01/11/15 1140  GLUCAP 179* 156* 218* 327* 293*    Recent Results (from the past 240 hour(s))  MRSA PCR Screening     Status: None   Collection Time: 01/05/15  1:45 PM  Result Value Ref Range Status  MRSA by PCR NEGATIVE NEGATIVE Final    Comment:        The GeneXpert MRSA Assay (FDA approved for NASAL specimens only), is one component of a comprehensive MRSA colonization surveillance program. It is not intended to diagnose MRSA infection nor to guide or monitor treatment for MRSA infections.   Blood culture (routine x 2)     Status: None   Collection Time: 01/05/15  4:49 PM  Result Value Ref Range Status   Specimen Description BLOOD RIGHT ANTECUBITAL  Final   Special Requests BOTTLES DRAWN AEROBIC AND ANAEROBIC 5CC EACH  Final   Culture   Final    NO GROWTH 5 DAYS Performed at [facility]    Report Status 01/10/2015 FINAL  Final  Blood culture (routine x 2)     Status: None   Collection Time: 01/05/15  5:00 PM  Result Value Ref Range Status   Specimen Description BLOOD RIGHT HAND  Final   Special Requests BOTTLES DRAWN AEROBIC AND ANAEROBIC 5 CC EACH  Final   Culture   Final    NO GROWTH 5 DAYS Performed at [facility]    Report Status  01/10/2015 FINAL  Final  Rapid strep screen (not at [facility])     Status: None   Collection Time: 01/05/15  7:00 PM  Result Value Ref Range Status   Streptococcus, Group A Screen (Direct) NEGATIVE NEGATIVE Final    Comment: (NOTE) A Rapid Antigen test may result negative if the antigen level in the sample is below the detection level of this test. The FDA has not cleared this test as a stand-alone test therefore the rapid antigen negative result has reflexed to a Group A Strep culture.   Culture, Group A Strep     Status: None   Collection Time: 01/05/15  7:00 PM  Result Value Ref Range Status   Strep A Culture Negative  Final    Comment: (NOTE) Performed At: [facility] [place], [place] [id number] Lindon Romp MD [id number]   Urine culture     Status: None (Preliminary result)   Collection Time: 01/09/15  1:33 PM  Result Value Ref Range Status   Specimen Description URINE, CLEAN CATCH  Final   Special Requests NONE  Final   Culture >=100,000 COLONIES/mL GRAM NEGATIVE RODS  Final   Report Status PENDING  Incomplete  Culture, blood (routine x 2)     Status: None (Preliminary result)   Collection Time: 01/09/15  3:34 PM  Result Value Ref Range Status   Specimen Description BLOOD LEFT ARM  Final   Special Requests BOTTLES DRAWN AEROBIC AND ANAEROBIC 5CC  Final   Culture NO GROWTH < 24 HOURS  Final   Report Status PENDING  Incomplete  Culture, blood (routine x 2)     Status: None (Preliminary result)   Collection Time: 01/09/15  3:40 PM  Result Value Ref Range Status   Specimen Description BLOOD LEFT HAND  Final   Special Requests BOTTLES DRAWN AEROBIC ONLY 5CC  Final   Culture NO GROWTH < 24 HOURS  Final   Report Status PENDING  Incomplete     Studies:              All Imaging reviewed and is as per above notation   Scheduled Meds: . cefTRIAXone (ROCEPHIN)  IV  1 g Intravenous Q24H  . enoxaparin (LOVENOX) injection  30 mg Subcutaneous Q24H  .  famotidine  20 mg Oral  Daily  . feeding supplement (GLUCERNA SHAKE)  237 mL Oral TID BM  . insulin aspart  0-9 Units Subcutaneous TID WC  . insulin aspart  3 Units Subcutaneous TID WC  . [START ON 01/12/2015] insulin glargine  45 Units Subcutaneous Daily  . mycophenolate  720 mg Oral BID  . pantoprazole  20 mg Oral Daily  . predniSONE  5 mg Oral QHS  . tacrolimus  2 mg Oral BID   Continuous Infusions: . sodium chloride 75 mL/hr at 01/10/15 1500     Assessment/Plan:  1. Sepsis in patient on immmunosuppressants-source possible Pyelo-UC shows >100,000 CFU gr neg rods-await speciation.  Monitor CBC. Await prograf/cellcept levels.  continue rocephin and narrow as appropriate. 2. 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  Keep on IV saline 75 cc/hr for now.  Rpt labs in am. 3. Mild hypotension-probably constitutional.  Hold Lisinopril-hctz 1 tab going forward 4. DM ty2 on pump-transition off of pump to Lantus 40-->lantus 45 and ssi.  Needs to follow with endocrine for refilling.  CBG ranging higher in 200-300 range. 5. Dilutional anemia-unclear etiology.  Basleine Hb 9-10 range.  7.6 on 10/28-rpt am 6. Gerd-n/v-continue Famotidine 20 daily.  Added protonix 20 daily.  improved  Code Status: full Family Communication:  No family +, d/w family on telephone and updated Disposition Plan:  Inpatient-liekly d/c home    Verneita Griffes, MD  [facility] Pager [phone] 01/11/2015, 1:00 PM    LOS: 2 days

## 2015-01-12 DIAGNOSIS — F502 Bulimia nervosa: Secondary | ICD-10-CM

## 2015-01-12 LAB — GLUCOSE, CAPILLARY
GLUCOSE-CAPILLARY: 247 mg/dL — AB (ref 65–99)
GLUCOSE-CAPILLARY: 66 mg/dL (ref 65–99)
Glucose-Capillary: 159 mg/dL — ABNORMAL HIGH (ref 65–99)
Glucose-Capillary: 132 mg/dL — ABNORMAL HIGH (ref 65–99)

## 2015-01-12 LAB — URINE CULTURE

## 2015-01-12 LAB — CBC WITH DIFFERENTIAL/PLATELET
BASOS ABS: 0 10*3/uL (ref 0.0–0.1)
BASOS PCT: 0 %
EOS PCT: 2 %
Eosinophils Absolute: 0.1 10*3/uL (ref 0.0–0.7)
HEMATOCRIT: 25 % — AB (ref 36.0–46.0)
Hemoglobin: 7.8 g/dL — ABNORMAL LOW (ref 12.0–15.0)
Lymphocytes Relative: 21 %
Lymphs Abs: 1.2 10*3/uL (ref 0.7–4.0)
MCH: 28.5 pg (ref 26.0–34.0)
MCHC: 31.2 g/dL (ref 30.0–36.0)
MCV: 91.2 fL (ref 78.0–100.0)
MONO ABS: 0.5 10*3/uL (ref 0.1–1.0)
MONOS PCT: 9 %
NEUTROS ABS: 3.9 10*3/uL (ref 1.7–7.7)
Neutrophils Relative %: 68 %
PLATELETS: 383 10*3/uL (ref 150–400)
RBC: 2.74 MIL/uL — ABNORMAL LOW (ref 3.87–5.11)
RDW: 14 % (ref 11.5–15.5)
WBC: 5.8 10*3/uL (ref 4.0–10.5)

## 2015-01-12 LAB — BASIC METABOLIC PANEL
Anion gap: 10 (ref 5–15)
BUN: 21 mg/dL — AB (ref 6–20)
CALCIUM: 7.2 mg/dL — AB (ref 8.9–10.3)
CO2: 19 mmol/L — AB (ref 22–32)
CREATININE: 1.95 mg/dL — AB (ref 0.44–1.00)
Chloride: 110 mmol/L (ref 101–111)
GFR calc Af Amer: 38 mL/min — ABNORMAL LOW (ref >60)
GFR, EST NON AFRICAN AMERICAN: 33 mL/min — AB (ref >60)
GLUCOSE: 270 mg/dL — AB (ref 65–99)
Potassium: 3.7 mmol/L (ref 3.5–5.1)
Sodium: 139 mmol/L (ref 135–145)

## 2015-01-12 LAB — TACROLIMUS LEVEL: Tacrolimus (FK506) - LabCorp: 10 ng/mL (ref 2.0–20.0)

## 2015-01-12 MED ORDER — FAMOTIDINE 20 MG PO TABS: 20.0000 mg | ORAL_TABLET | Freq: Every day | ORAL | Status: DC

## 2015-01-12 MED ORDER — SULFAMETHOXAZOLE-TRIMETHOPRIM 800-160 MG PO TABS: 1.0000 | ORAL_TABLET | Freq: Two times a day (BID) | ORAL | Status: DC

## 2015-01-12 MED ORDER — SULFAMETHOXAZOLE-TRIMETHOPRIM 800-160 MG PO TABS
1.0000 | ORAL_TABLET | Freq: Two times a day (BID) | ORAL | Status: DC
  Administered 2015-01-12: 1 via ORAL
  Filled 2015-01-12: qty 1

## 2015-01-12 MED ORDER — PANTOPRAZOLE SODIUM 20 MG PO TBEC: 20.0000 mg | DELAYED_RELEASE_TABLET | Freq: Every day | ORAL | Status: DC

## 2015-01-12 NOTE — Progress Notes (Signed)
[name] to be D/C'd Home per MD order.  Discussed prescriptions and follow up appointments with the patient. Prescriptions given to patient, medication list explained in detail. Pt verbalized understanding.    Medication List    STOP taking these medications        ranitidine 150 MG tablet  Commonly known as:  ZANTAC  Replaced by:  famotidine 20 MG tablet      TAKE these medications        acetaminophen 500 MG tablet  Commonly known as:  TYLENOL  Take 1,000 mg by mouth every 6 (six) hours as needed for mild pain.     calcium carbonate 500 MG chewable tablet  Commonly known as:  TUMS - dosed in mg elemental calcium  Chew 2 tablets by mouth 3 (three) times a week. Takes on Monday, wednesdays, and fridays     famotidine 20 MG tablet  Commonly known as:  PEPCID  Take 1 tablet (20 mg total) by mouth daily.     glucose blood test strip  Commonly known as:  BAYER CONTOUR NEXT TEST  Check 8 times per day, and lancets     hydrOXYzine 25 MG tablet  Commonly known as:  ATARAX/VISTARIL  Take 25 mg by mouth 2 (two) times daily as needed for itching.     lisinopril-hydrochlorothiazide 20-25 MG tablet  Commonly known as:  PRINZIDE,ZESTORETIC  Take 1 tablet by mouth daily as needed (blood pressure).     mycophenolate 360 MG Tbec EC tablet  Commonly known as:  MYFORTIC  Take 720 mg by mouth 2 (two) times daily.     NOVOLOG 100 UNIT/ML injection  Generic drug:  insulin aspart  For use in pump, total of 80 units per day.     pantoprazole 20 MG tablet  Commonly known as:  PROTONIX  Take 1 tablet (20 mg total) by mouth daily.     predniSONE 5 MG tablet  Commonly known as:  DELTASONE  Take 5 mg by mouth at bedtime.     promethazine 12.5 MG tablet  Commonly known as:  PHENERGAN  Take 1 tablet (12.5 mg total) by mouth every 6 (six) hours as needed for nausea or vomiting.     simvastatin 20 MG tablet  Commonly known as:  ZOCOR  Take 20 mg by mouth every evening.     sulfamethoxazole-trimethoprim 800-160 MG tablet  Commonly known as:  BACTRIM DS,SEPTRA DS  Take 1 tablet by mouth every 12 (twelve) hours.     tacrolimus 1 MG capsule  Commonly known as:  PROGRAF  Take 2 mg by mouth 2 (two) times daily.        Filed Vitals:   01/12/15 1745  BP: 145/75  Pulse: 79  Temp: 98 F (36.7 C)  Resp: 18    Skin clean, dry and intact without evidence of skin break down, no evidence of skin tears noted. IV catheter discontinued intact. Site without signs and symptoms of complications. Dressing and pressure applied. Pt denies pain at this time. No complaints noted.  An After Visit Summary was printed and given to the patient. Patient escorted via [place], and D/C home via private auto.  Marijean Heath C 01/12/2015 7:13 PM

## 2015-01-12 NOTE — Discharge Summary (Signed)
Physician Discharge Summary  [name] [id number] DOB: 08/09/1985 DOA: 01/09/2015  PCP: Elyn Peers, MD  Admit date: 01/09/2015 Discharge date: 01/12/2015  Time spent: 33 minutes  Recommendations for Outpatient Follow-up:  1. complete bactrim 11/1 for Klebsiella Pyelo 2. Needs to continue Pepcid and protonix 3. Would folllow mycophenolate and tacrolimus levels as OP 4. Patient cautioned to stay in Hospital x 1 more day but wanted to go home for halloween    Discharge Diagnoses:  Principal Problem:   UTI (urinary tract infection) Active Problems:   Anemia in chronic renal disease   Essential hypertension, benign   KIDNEY TRANSPLANTATION, HX OF   Diabetes ([place])   Immunosuppressed status (H[facility])   Abdominal pain   Nausea and vomiting   Acute on chronic kidney failure ([place])   UTI (lower urinary tract infection)   Acute UTI   Emesis   Discharge Condition: fair  Diet recommendation: hh low salt  Filed Weights   01/09/15 1128 01/09/15 2009 01/10/15 2029  Weight: 70.943 kg (156 lb 6.4 oz) 70 kg (154 lb 5.2 oz) 73.12 kg (161 lb 3.2 oz)    History of present illness:  29 y/o ? 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 Sepsis in patient on immmunosuppressants-source possible Pyelo-UC shows >100,000 Kelbsiella. Monitor CBC. Follow prograf/cellcept levels as OP. narrowed to Bactrim ds for another 4 doses to complete 5 days total of Abx for pyelo. 2. 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 creatinine improved on d/c to 1.9 3. Mild hypotension-probably constitutional. Hold Lisinopril-hctz 1 tab going forward-recumed on d/c home 4. DM ty2 on pump-transition off of pump to Lantus 40-->lantus 45 and  ssi. Needs to follow with endocrine for refilling. CBG ranging higher in 200-300 range.  SHe will use her home pump settign on d/c home 5. Dilutional anemia-unclear etiology. Basleine Hb 9-10 range. 7.6 on 10/28-rpt was consitient-she has no bleeding.  Will need Erythrogenic factor soon 6. Gerd-n/v-continue Famotidine 20 daily. Added protonix 20 daily. improved-d/c wth pepcid and Protonix Rx called into phamracy   Discharge Exam: Filed Vitals:   01/12/15 0831  BP: 139/93  Pulse: 88  Temp: 98.5 F (36.9 C)  Resp: 18   Better Has diarrhea yesterday but this is better aftr stopping shakes. No cp nor n/v Ate fair  General: eomi ncat Cardiovascular: s1 s 2no m/r/g Respiratory:  clear  Discharge Instructions   Discharge Instructions    AMB Referral to [place] Management    Complete by:  As directed   Reason for consult:  Post hospital follow up for community nurse  Diagnoses of:  Diabetes  Expected date of contact:  1-3 days (reserved for hospital discharges)  Please assign to community nurse for transition of care calls and assess for home visits. Patient with history of kidney transplant, Diabetes with insulin pump, frequent admissions.  Questions please call:  Natividad Brood, RN BSN [facility] Liaison  [phone] business mobile phone     Diet - low sodium heart healthy    Complete by:  As directed      Discharge instructions    Complete by:  As directed   Finish 4 more doses of the antibiotic bactrim DS for pyelonephritis i have given  you a Rx for pepcid and protonix for your stomach.  Make sure that you use this for a short period of time Your Mycophenolic acid and Tcrolimus levels were checked in the hospital-please call your nephrologist to get the results as your doses may warrant adjustment     Increase activity slowly    Complete by:  As directed           Current Discharge Medication List    START taking these medications   Details   famotidine (PEPCID) 20 MG tablet Take 1 tablet (20 mg total) by mouth daily. Qty: 30 tablet, Refills: 0    pantoprazole (PROTONIX) 20 MG tablet Take 1 tablet (20 mg total) by mouth daily. Qty: 30 tablet, Refills: 0    sulfamethoxazole-trimethoprim (BACTRIM DS,SEPTRA DS) 800-160 MG tablet Take 1 tablet by mouth every 12 (twelve) hours. Qty: 4 tablet, Refills: 0      CONTINUE these medications which have NOT CHANGED   Details  acetaminophen (TYLENOL) 500 MG tablet Take 1,000 mg by mouth every 6 (six) hours as needed for mild pain.    calcium carbonate (TUMS) 500 MG chewable tablet Chew 2 tablets by mouth 3 (three) times a week. Takes on Monday, wednesdays, and fridays    lisinopril-hydrochlorothiazide (PRINZIDE,ZESTORETIC) 20-25 MG tablet Take 1 tablet by mouth daily as needed (blood pressure).  Refills: 5    mycophenolate (MYFORTIC) 360 MG TBEC Take 720 mg by mouth 2 (two) times daily.     NOVOLOG 100 UNIT/ML injection For use in pump, total of 80 units per day. Qty: 30 mL, Refills: 11    predniSONE (DELTASONE) 5 MG tablet Take 5 mg by mouth at bedtime.     simvastatin (ZOCOR) 20 MG tablet Take 20 mg by mouth every evening.    tacrolimus (PROGRAF) 1 MG capsule Take 2 mg by mouth 2 (two) times daily.     glucose blood (BAYER CONTOUR NEXT TEST) test strip Check 8 times per day, and lancets Qty: 300 each, Refills: 12    hydrOXYzine (ATARAX) 25 MG tablet Take 25 mg by mouth 2 (two) times daily as needed for itching.     promethazine (PHENERGAN) 12.5 MG tablet Take 1 tablet (12.5 mg total) by mouth every 6 (six) hours as needed for nausea or vomiting. Qty: 30 tablet, Refills: 0      STOP taking these medications     ranitidine (ZANTAC) 150 MG tablet        Allergies  Allergen Reactions  . Motrin [Ibuprofen] Shortness Of Breath and Itching    Per pt  . Banana Other (See Comments)    Sick on the stomach  . Diphenhydramine Hcl     REACTION: Stopped breathing in ICU  .  Iron Dextran     REACTION: vein irritation  . Shellfish Allergy Hives      The results of significant diagnostics from this hospitalization (including imaging, microbiology, ancillary and laboratory) are listed below for reference.    Significant Diagnostic Studies: Dg Chest 2 View  01/05/2015  CLINICAL DATA:  Cough.  Fever.  Renal transplant. EXAM: CHEST  2 VIEW COMPARISON:  09/30/2014 FINDINGS: The heart size and mediastinal contours are within normal limits. Both lungs are clear. The visualized skeletal structures are unremarkable. IMPRESSION: Normal exam. Electronically Signed   By: Lorriane Shire M.D.   On: 01/05/2015 17:17   Abd 1 View (kub)  01/09/2015  CLINICAL DATA:  abd pain and nausea x 2 days EXAM: ABDOMEN -  1 VIEW COMPARISON:  CT of the abdomen and pelvis 06/23/2014 FINDINGS: The bowel gas pattern is normal. No radio-opaque calculi or other significant radiographic abnormality are seen. Surgical clips are identified in the mid to lower abdomen. IMPRESSION: Negative. Electronically Signed   By: Nolon Nations M.D.   On: 01/09/2015 17:35   US Renal  01/06/2015  CLINICAL DATA:  Acute onset of renal failure.  Initial encounter. EXAM: RENAL / URINARY TRACT ULTRASOUND COMPLETE COMPARISON:  CT of the abdomen and pelvis performed 06/23/2014 FINDINGS: Right Kidney: The right kidney is absent, on correlation with prior CT. Left Kidney: The native left kidney is not visualized. Transplant Kidney: The transplant kidney at the right iliac fossa demonstrates multiple peripheral cysts, as on the prior study, and is grossly unremarkable in appearance. There is no evidence of hydronephrosis. Bladder: Appears normal for degree of bladder distention. IMPRESSION: No evidence of hydronephrosis. Transplant kidney demonstrates multiple peripheral cysts, but is otherwise unremarkable. Electronically Signed   By: Garald Balding M.D.   On: 01/06/2015 01:58    Microbiology: Recent Results (from the past  240 hour(s))  MRSA PCR Screening     Status: None   Collection Time: 01/05/15  1:45 PM  Result Value Ref Range Status   MRSA by PCR NEGATIVE NEGATIVE Final    Comment:        The GeneXpert MRSA Assay (FDA approved for NASAL specimens only), is one component of a comprehensive MRSA colonization surveillance program. It is not intended to diagnose MRSA infection nor to guide or monitor treatment for MRSA infections.   Blood culture (routine x 2)     Status: None   Collection Time: 01/05/15  4:49 PM  Result Value Ref Range Status   Specimen Description BLOOD RIGHT ANTECUBITAL  Final   Special Requests BOTTLES DRAWN AEROBIC AND ANAEROBIC 5CC EACH  Final   Culture   Final    NO GROWTH 5 DAYS Performed at [facility]    Report Status 01/10/2015 FINAL  Final  Blood culture (routine x 2)     Status: None   Collection Time: 01/05/15  5:00 PM  Result Value Ref Range Status   Specimen Description BLOOD RIGHT HAND  Final   Special Requests BOTTLES DRAWN AEROBIC AND ANAEROBIC 5 CC EACH  Final   Culture   Final    NO GROWTH 5 DAYS Performed at [facility]    Report Status 01/10/2015 FINAL  Final  Rapid strep screen (not at [facility])     Status: None   Collection Time: 01/05/15  7:00 PM  Result Value Ref Range Status   Streptococcus, Group A Screen (Direct) NEGATIVE NEGATIVE Final    Comment: (NOTE) A Rapid Antigen test may result negative if the antigen level in the sample is below the detection level of this test. The FDA has not cleared this test as a stand-alone test therefore the rapid antigen negative result has reflexed to a Group A Strep culture.   Culture, Group A Strep     Status: None   Collection Time: 01/05/15  7:00 PM  Result Value Ref Range Status   Strep A Culture Negative  Final    Comment: (NOTE) Performed At: [facility] [address] [place], [place] [id number] Lindon Romp MD [id number]   Urine culture     Status: None    Collection Time: 01/09/15  1:33 PM  Result Value Ref Range Status   Specimen Description URINE, CLEAN CATCH  Final   Special Requests NONE  Final   Culture >=100,000 COLONIES/mL KLEBSIELLA PNEUMONIAE  Final   Report Status 01/12/2015 FINAL  Final   Organism ID, Bacteria KLEBSIELLA PNEUMONIAE  Final      Susceptibility   Klebsiella pneumoniae - MIC*    AMPICILLIN >=32 RESISTANT Resistant     CEFAZOLIN <=4 SENSITIVE Sensitive     CEFTRIAXONE <=1 SENSITIVE Sensitive     CIPROFLOXACIN <=0.25 SENSITIVE Sensitive     GENTAMICIN <=1 SENSITIVE Sensitive     IMIPENEM <=0.25 SENSITIVE Sensitive     NITROFURANTOIN 32 SENSITIVE Sensitive     TRIMETH/SULFA <=20 SENSITIVE Sensitive     AMPICILLIN/SULBACTAM >=32 RESISTANT Resistant     PIP/TAZO 64 INTERMEDIATE Intermediate     * >=100,000 COLONIES/mL KLEBSIELLA PNEUMONIAE  Culture, blood (routine x 2)     Status: None (Preliminary result)   Collection Time: 01/09/15  3:34 PM  Result Value Ref Range Status   Specimen Description BLOOD LEFT ARM  Final   Special Requests BOTTLES DRAWN AEROBIC AND ANAEROBIC 5CC  Final   Culture NO GROWTH 3 DAYS  Final   Report Status PENDING  Incomplete  Culture, blood (routine x 2)     Status: None (Preliminary result)   Collection Time: 01/09/15  3:40 PM  Result Value Ref Range Status   Specimen Description BLOOD LEFT HAND  Final   Special Requests BOTTLES DRAWN AEROBIC ONLY 5CC  Final   Culture NO GROWTH 3 DAYS  Final   Report Status PENDING  Incomplete  C difficile quick screen w PCR reflex     Status: None   Collection Time: 01/11/15  6:54 PM  Result Value Ref Range Status   C Diff antigen NEGATIVE NEGATIVE Final   C Diff toxin NEGATIVE NEGATIVE Final   C Diff interpretation Negative for toxigenic C. difficile  Final     Labs: Basic Metabolic Panel:  Recent Labs Lab 01/07/15 0800 01/09/15 1100 01/09/15 1154 01/10/15 0530 01/12/15 0610  NA 137 137 136 137 139  K 4.2 3.6 3.8 3.5 3.7  CL 108 102  102 104 110  CO2 20* 20* 20* 18* 19*  GLUCOSE 233* 272* 279* 124* 270*  BUN 43* 23* 23* 24* 21*  CREATININE 2.20* 2.31* 2.39* 2.45* 1.95*  CALCIUM 8.2* 8.2* 8.3* 7.5* 7.2*  PHOS 3.6 3.9  --   --   --    Liver Function Tests:  Recent Labs Lab 01/06/15 0550 01/07/15 0800 01/09/15 1100 01/09/15 1154  AST 22  --   --  16  ALT 15  --   --  17  ALKPHOS 68  --   --  69  BILITOT 0.5  --   --  0.7  PROT 7.6  --   --  6.8  ALBUMIN 3.1* 2.9* 2.7* 2.8*    Recent Labs Lab 01/09/15 1154  LIPASE 22   No results for input(s): AMMONIA in the last 168 hours. CBC:  Recent Labs Lab 01/05/15 1451 01/06/15 0550 01/09/15 1111 01/09/15 1154 01/10/15 0530 01/12/15 0610  WBC 11.8* 8.3  --  12.1* 9.6 5.8  NEUTROABS  --  6.6  --   --   --  3.9  HGB 10.1* 9.7* 9.1* 9.3* 7.6* 7.8*  HCT 31.0* 28.8*  --  29.3* 23.3* 25.0*  MCV 88.6 87.8  --  89.9 90.3 91.2  PLT 295 297  --  327 299 383   Cardiac Enzymes:  Recent Labs Lab 01/06/15 0550  CKTOTAL  25*   BNP: BNP (last 3 results) No results for input(s): BNP in the last 8760 hours.  ProBNP (last 3 results) No results for input(s): PROBNP in the last 8760 hours.  CBG:  Recent Labs Lab 01/11/15 1140 01/11/15 1626 01/11/15 2112 01/12/15 0805 01/12/15 1134  GLUCAP 293* 91 116* 247* 159*       Signed:  Nita Sells  [facility] 01/12/2015, 1:16 PM

## 2015-01-13 LAB — MYCOPHENOLIC ACID (CELLCEPT)
MPA GLUCURONIDE: 10 ug/mL — AB (ref 15–125)
MPA: 3 ug/mL (ref 1.0–3.5)

## 2015-01-13 NOTE — Progress Notes (Signed)
Patient called with questions about her prescriptions that were sent to her pharmacy.  Patient stated that she did not Get her Protonix prescriptions.  Her pharmacy informed us that prior authorization was required by her insurance company.  Patient notified.  Jillyn Ledger, MBA, BS, RN

## 2015-01-13 NOTE — Patient Outreach (Signed)
[facility]) Care Management  01/13/2015  DONIESHA RHINES 1985-12-30 [id number]   Referral from Natividad Brood, RN to assign Community RN, assigned [name], RN.  Thanks, Ronnell Freshwater. [place], [place] Assistant Phone: [phone] Fax: [phone]

## 2015-01-14 ENCOUNTER — Other Ambulatory Visit (HOSPITAL_COMMUNITY): Payer: Self-pay | Admitting: *Deleted

## 2015-01-14 ENCOUNTER — Other Ambulatory Visit: Payer: Self-pay

## 2015-01-14 LAB — CULTURE, BLOOD (ROUTINE X 2)
CULTURE: NO GROWTH
Culture: NO GROWTH

## 2015-01-14 NOTE — Patient Outreach (Signed)
[place] [facility]) Care Management  01/14/2015  [name] [birth year] [id number]  Assessment: 29 year old with recent admission 10/27-10/30 with acute urinary tract infection, nausea/vomiting. Member has a history of insulin dependent diabetes, kidney transplant. Transition of care call complete. Member denies any issues at this time. Member reports she sees Dr. Loanne Drilling for diabetes management and Diabetes/Nutritionist about every two weeks. Member has all her medications and report she is taking her antibiotics. RNCM encouraged member to take all her antibiotic pills until the bottle is finished.  RNCM discussed transition of care program-attempted to schedule a home visit for next week. Member agreeing to telephonic follow up for next week.  Plan: Transition of care call next week.  [name], RN, MSN, [place] Coordinator Cell: [phone]

## 2015-01-15 ENCOUNTER — Encounter (HOSPITAL_COMMUNITY)
Admission: RE | Admit: 2015-01-15 | Discharge: 2015-01-15 | Disposition: A | Discharge status: Home / Self Care | Payer: Medicare Other | Source: Ambulatory Visit | Attending: Nephrology | Admitting: Nephrology

## 2015-01-15 DIAGNOSIS — N183 Chronic kidney disease, stage 3 (moderate): Secondary | ICD-10-CM | POA: Insufficient documentation

## 2015-01-15 DIAGNOSIS — D631 Anemia in chronic kidney disease: Secondary | ICD-10-CM | POA: Insufficient documentation

## 2015-01-15 LAB — POCT HEMOGLOBIN-HEMACUE: Hemoglobin: 9.9 g/dL — ABNORMAL LOW (ref 12.0–15.0)

## 2015-01-15 MED ORDER — FERUMOXYTOL INJECTION 510 MG/17 ML
510.0000 mg | Freq: Once | INTRAVENOUS | Status: AC
  Administered 2015-01-15: 510 mg via INTRAVENOUS
  Filled 2015-01-15: qty 17

## 2015-01-15 MED ORDER — EPOETIN ALFA 40000 UNIT/ML IJ SOLN: 30000.0000 [IU] | INTRAMUSCULAR | Status: DC

## 2015-01-20 ENCOUNTER — Other Ambulatory Visit: Payer: Self-pay

## 2015-01-20 NOTE — Patient Outreach (Signed)
[place] [facility]) Care Management  01/20/2015  [name] [birth year] [id number]  Assessment: Call for transition of care. No answer. HIPPA compliant voice message left.  Plan: await return call and follow up next week if no return call.  [name], RN, MSN, [place] Coordinator Cell: [phone]

## 2015-01-22 ENCOUNTER — Ambulatory Visit (INDEPENDENT_AMBULATORY_CARE_PROVIDER_SITE_OTHER): Payer: Medicare Other | Admitting: Endocrinology

## 2015-01-22 ENCOUNTER — Ambulatory Visit: Payer: Medicare Other | Admitting: Endocrinology

## 2015-01-22 ENCOUNTER — Encounter: Payer: Medicare Other | Admitting: Nutrition

## 2015-01-22 ENCOUNTER — Encounter: Payer: Self-pay | Admitting: Endocrinology

## 2015-01-22 ENCOUNTER — Encounter: Payer: Medicare Other | Attending: Endocrinology | Admitting: Nutrition

## 2015-01-22 VITALS — BP 128/84 | HR 118 | Temp 97.7°F | Ht 66.0 in | Wt 161.0 lb

## 2015-01-22 DIAGNOSIS — IMO0001 Reserved for inherently not codable concepts without codable children: Secondary | ICD-10-CM

## 2015-01-22 DIAGNOSIS — Z794 Long term (current) use of insulin: Secondary | ICD-10-CM

## 2015-01-22 DIAGNOSIS — E119 Type 2 diabetes mellitus without complications: Secondary | ICD-10-CM | POA: Diagnosis not present

## 2015-01-22 DIAGNOSIS — R739 Hyperglycemia, unspecified: Secondary | ICD-10-CM

## 2015-01-22 DIAGNOSIS — E0822 Diabetes mellitus due to underlying condition with diabetic chronic kidney disease: Secondary | ICD-10-CM | POA: Diagnosis not present

## 2015-01-22 DIAGNOSIS — Z713 Dietary counseling and surveillance: Secondary | ICD-10-CM | POA: Diagnosis not present

## 2015-01-22 NOTE — Progress Notes (Signed)
Subjective:    Patient ID: [name], female    DOB: 05-May-1985, 29 y.o.   MRN: [id number]  HPI Pt returns for f/u of diabetes mellitus: DM type: 1 Dx'ed: 2012, when she was on prednisone Complications: polyneuropathy and renal failure (transplant in 2007) Therapy: insulin since soon after dx GDM: (G0) DKA: several episodes, most recently in mid-2016 Severe hypoglycemia: never Pancreatitis: never Other: she has been on pump rx since early 2016; she is chronically on prednisone.  Interval history:  [name], RN, has found pump to have these settings: basal rate of 2.2 units/hr, 24 HRS per day mealtime bolus of 1 unit/10 grams  "correction bolus" of 1 unit/50 mg% over 150.   pt was recently in the hospital for pyelonephritis, but she feels better in general now.  She does not know to look up the total daily insulin dosage from her pump.  She says she takes 3-4 boluses per day.  Meter is downloaded today, and the printout is scanned into the record.  It varies from 150-400.  There is no trend throughout the day. I discussed with Vaughan Basta, who says pt is not entering any CHO into the pump.  She is also disconnecting her pump at night.   Past Medical History  Diagnosis Date  . Chronic kidney disease   . Hypertension   . Hyperlipidemia   . Diabetes mellitus     Pt reports diagnosis in June 2011  . UTI (urinary tract infection) 01/09/2015    Past Surgical History  Procedure Laterality Date  . Kidney transplant  2007  . Thyroid lobectomy    . Renal biopsy Bilateral 2003    Social History   Social History  . Marital Status: Single    Spouse Name: N/A  . Number of Children: N/A  . Years of Education: N/A   Occupational History  . Not on file.   Social History Main Topics  . Smoking status: Never Smoker   . Smokeless tobacco: Never Used  . Alcohol Use: No  . Drug Use: No  . Sexual Activity: Yes    Birth Control/ Protection: None   Other Topics Concern  . Not  on file   Social History Narrative    Current Outpatient Prescriptions on File Prior to Visit  Medication Sig Dispense Refill  . acetaminophen (TYLENOL) 500 MG tablet Take 1,000 mg by mouth every 6 (six) hours as needed for mild pain.    . calcium carbonate (TUMS) 500 MG chewable tablet Chew 2 tablets by mouth 3 (three) times a week. Takes on Monday, wednesdays, and fridays    . famotidine (PEPCID) 20 MG tablet Take 1 tablet (20 mg total) by mouth daily. 30 tablet 0  . glucose blood (BAYER CONTOUR NEXT TEST) test strip Check 8 times per day, and lancets 300 each 12  . hydrOXYzine (ATARAX) 25 MG tablet Take 25 mg by mouth 2 (two) times daily as needed for itching.     Marland Kitchen lisinopril-hydrochlorothiazide (PRINZIDE,ZESTORETIC) 20-25 MG tablet Take 1 tablet by mouth daily as needed (blood pressure).   5  . mycophenolate (MYFORTIC) 360 MG TBEC Take 720 mg by mouth 2 (two) times daily.     Marland Kitchen NOVOLOG 100 UNIT/ML injection For use in pump, total of 80 units per day. 30 mL 11  . pantoprazole (PROTONIX) 20 MG tablet Take 1 tablet (20 mg total) by mouth daily. 30 tablet 0  . predniSONE (DELTASONE) 5 MG tablet Take 5 mg by mouth  at bedtime.     . promethazine (PHENERGAN) 12.5 MG tablet Take 1 tablet (12.5 mg total) by mouth every 6 (six) hours as needed for nausea or vomiting. 30 tablet 0  . simvastatin (ZOCOR) 20 MG tablet Take 20 mg by mouth every evening.    . sulfamethoxazole-trimethoprim (BACTRIM DS,SEPTRA DS) 800-160 MG tablet Take 1 tablet by mouth every 12 (twelve) hours. 4 tablet 0  . tacrolimus (PROGRAF) 1 MG capsule Take 2 mg by mouth 2 (two) times daily.      No current facility-administered medications on file prior to visit.    Allergies  Allergen Reactions  . Motrin [Ibuprofen] Shortness Of Breath and Itching    Per pt  . Banana Other (See Comments)    Sick on the stomach  . Diphenhydramine Hcl     REACTION: Stopped breathing in ICU  . Iron Dextran     REACTION: vein irritation  .  Shellfish Allergy Hives    Family History  Problem Relation Age of Onset  . Arthritis Mother   . Diabetes Mother   . Hypertension Mother     BP 128/84 mmHg  Pulse 118  Temp(Src) 97.7 F (36.5 C) (Oral)  Ht 5\' 6"  (1.676 m)  Wt 161 lb (73.029 kg)  BMI 26.00 kg/m2  SpO2 96%  Review of Systems She denies hypoglycemia.      Objective:   Physical Exam VITAL SIGNS:  See vs page GENERAL: no distress Pulses: dorsalis pedis intact bilat.   MSK: no deformity of the feet CV: no leg edema Skin:  no ulcer on the feet, but the skin is dry.  normal color and temp on the feet. Neuro: sensation is intact to touch on the feet.   Lab Results  Component Value Date   HGBA1C 13.9 11/14/2014      Assessment & Plan:  DM: she needs increased rx Noncompliance with cbg recording and insulin dosing: [name], RN has gone over this with pt.  Patient is advised the following: Patient Instructions  check your blood sugar 4 times a day: before the 3 meals, and at bedtime.  also check if you have symptoms of your blood sugar being too high or too low.  please keep a record of the readings and bring it to your next appointment here.  You can write it on any piece of paper.  please call us sooner if your blood sugar goes below 70, or if you have a lot of readings over 200.  reduce basal rate to 2 units/hr, 24 hrs per day.   continue mealtime bolus of 1 unit/ 10 grams carbohydrate.   continue correction bolus (which some people call "sensitivity," or "insulin sensitivity ratio," or just "isr") of 1 unit for each 50 by which your glucose exceeds 150 Please call us early next week, to tell us how the blood sugar is doing.   Please come back for a follow-up appointment in 2-4 weeks.

## 2015-01-22 NOTE — Patient Instructions (Signed)
Do not take pump off at night Put carbs into pump before eating all meals.

## 2015-01-22 NOTE — Patient Instructions (Addendum)
check your blood sugar 4 times a day: before the 3 meals, and at bedtime.  also check if you have symptoms of your blood sugar being too high or too low.  please keep a record of the readings and bring it to your next appointment here.  You can write it on any piece of paper.  please call us sooner if your blood sugar goes below 70, or if you have a lot of readings over 200.  reduce basal rate to 2 units/hr, 24 hrs per day.   continue mealtime bolus of 1 unit/ 10 grams carbohydrate.   continue correction bolus (which some people call "sensitivity," or "insulin sensitivity ratio," or just "isr") of 1 unit for each 50 by which your glucose exceeds 150 Please call us early next week, to tell us how the blood sugar is doing.   Please come back for a follow-up appointment in 2-4 weeks.

## 2015-01-22 NOTE — Progress Notes (Signed)
Problems identified: 1.   Patient taking off pump every evening 2.  Pt. Not putting carbs in when eating.  Only doing a correction dose before meals.  Stressed the need to wear the pump every night.  She agreed to do this for 3 nights and to call blood sugars to office on Monday. Stressed the need to put carbs into pump for each meal eaten.  Told her that what she is doing now, is just correcting the high readings and not take anything for the meal time blood sugar rise.  She agreed to do this. Will wait to see what she does over the weekend.  Will call sooner if blood sugar drops low before then.

## 2015-01-23 ENCOUNTER — Encounter (HOSPITAL_COMMUNITY)
Admission: RE | Admit: 2015-01-23 | Discharge: 2015-01-23 | Disposition: A | Discharge status: Home / Self Care | Payer: Medicare Other | Source: Ambulatory Visit | Attending: Nephrology | Admitting: Nephrology

## 2015-01-23 ENCOUNTER — Encounter (HOSPITAL_COMMUNITY): Payer: Medicare Other

## 2015-01-23 DIAGNOSIS — N183 Chronic kidney disease, stage 3 (moderate): Secondary | ICD-10-CM | POA: Diagnosis not present

## 2015-01-23 DIAGNOSIS — D631 Anemia in chronic kidney disease: Secondary | ICD-10-CM | POA: Diagnosis not present

## 2015-01-23 LAB — POCT HEMOGLOBIN-HEMACUE: HEMOGLOBIN: 10.3 g/dL — AB (ref 12.0–15.0)

## 2015-01-23 MED ORDER — EPOETIN ALFA 20000 UNIT/ML IJ SOLN
INTRAMUSCULAR | Status: AC
  Administered 2015-01-23: 20000 [IU] via SUBCUTANEOUS
  Filled 2015-01-23: qty 1

## 2015-01-23 MED ORDER — EPOETIN ALFA 40000 UNIT/ML IJ SOLN: 30000.0000 [IU] | INTRAMUSCULAR | Status: DC

## 2015-01-23 MED ORDER — EPOETIN ALFA 10000 UNIT/ML IJ SOLN
INTRAMUSCULAR | Status: AC
  Administered 2015-01-23: 10000 [IU] via SUBCUTANEOUS
  Filled 2015-01-23: qty 1

## 2015-01-27 ENCOUNTER — Other Ambulatory Visit: Payer: Self-pay

## 2015-01-27 NOTE — Patient Outreach (Signed)
[place] [facility]) Care Management  01/27/2015  [name] [birth year] [id number]  Assessment- 29 year old with recent admission for hyperglycemia. RNCM spoke with member who reports she has followed up with endocrinologist reporting her basal rate was changed, but states she does not know what is has been changed to. Member reports her blood sugar levels have been around 189. RNCM reviewed instructions per Endocrinologist notes. Member stated she did not have more time to talk as she was on her way to work. Member denies any questions for issues at this time.  Plan: Transition of Care call next week.  [name], RN, MSN, [place] Coordinator Cell: [phone]

## 2015-01-30 DIAGNOSIS — D649 Anemia, unspecified: Secondary | ICD-10-CM | POA: Diagnosis not present

## 2015-01-30 DIAGNOSIS — N2581 Secondary hyperparathyroidism of renal origin: Secondary | ICD-10-CM | POA: Diagnosis not present

## 2015-01-30 DIAGNOSIS — Z87440 Personal history of urinary (tract) infections: Secondary | ICD-10-CM | POA: Diagnosis not present

## 2015-01-30 DIAGNOSIS — E785 Hyperlipidemia, unspecified: Secondary | ICD-10-CM | POA: Diagnosis not present

## 2015-01-30 DIAGNOSIS — Z79899 Other long term (current) drug therapy: Secondary | ICD-10-CM | POA: Diagnosis not present

## 2015-01-30 DIAGNOSIS — N1330 Unspecified hydronephrosis: Secondary | ICD-10-CM | POA: Diagnosis not present

## 2015-01-30 DIAGNOSIS — Z940 Kidney transplant status: Secondary | ICD-10-CM | POA: Diagnosis not present

## 2015-01-30 DIAGNOSIS — N186 End stage renal disease: Secondary | ICD-10-CM | POA: Diagnosis not present

## 2015-02-03 ENCOUNTER — Other Ambulatory Visit: Payer: Self-pay

## 2015-02-03 ENCOUNTER — Ambulatory Visit: Payer: Medicare Other

## 2015-02-03 NOTE — Patient Outreach (Addendum)
[place] [facility]) Care Management  02/03/2015  KHYLEI WILMS Mar 06, 1986 [id number]  Assessment: 29 year old with recent admission to hospital. RNCM is following member in the transition of care program. History of renal transplant, chronic kidney disease, urinary tract infection, sepsis. Transition of care call-Telephonic assessment completed: member reports she lives alone and has a very supportive family. Ms. Yearwood reports that she works part time.   Ms. Eskridge report she lost her mother this past July and is still going through the grieving process adding she and her mother were very close and the upcoming holidays were her mother's favorite. She denies needing any help at this time. RNCM encouraged member to discuss. RNCM reinforced that [facility] social worker could assist with resources as needed.   History of diabetes-followed by Dr. Loanne Drilling for diabetes management. Member reports her blood sugar was 117 this morning. When asked about target blood sugar range, Ms. Lovins reports they are in the process of establishing what her target blood sugar range is. Ms. Fuchs states the lowest her blood sugar has been is 90 and she felt hot and shaky at that number. Next appointment is November 28 the endocrinologist. [facility] reinforced per Dr. Cordelia Pen instructions to check blood sugar tid with meals and bedtime; call for a lot of blood sugar greater than 200 or less than 70.  Plan: home visit next week.  THN CM Care Plan Problem One        Most Recent Value   Care Plan Problem One  at risk for readmission   Role Documenting the Problem One  Care Management Coordinator   Care Plan for Problem One  Active   THN Long Term Goal (31-90 days)  Member will not be readmitted within 31 days   THN Long Term Goal Start Date  01/14/15   Interventions for Problem One Long Term Goal  transition of care call,reviewed medications, reinforced endocrinologist instructions   THN CM Short Term Goal #1 (0-30 days)   member will attend follow up appointments as scheduled within the next 30 days   THN CM Short Term Goal #1 Start Date  01/14/15   Interventions for Short Term Goal #1  discussed scheduled upcoming appointments.   THN CM Short Term Goal #2 (0-30 days)  member will take antibiotics as ordered until all are gone within the next week.   THN CM Short Term Goal #2 Start Date  01/14/15   [facility] CM Short Term Goal #2 Met Date  02/03/15 Maylon Peppers reports has taken antibitotics as ordered.]     [name], RN, MSN, [place] Coordinator Cell: [phone]

## 2015-02-05 ENCOUNTER — Encounter (HOSPITAL_COMMUNITY)
Admission: RE | Admit: 2015-02-05 | Discharge: 2015-02-05 | Disposition: A | Discharge status: Home / Self Care | Payer: Medicare Other | Source: Ambulatory Visit | Attending: Nephrology | Admitting: Nephrology

## 2015-02-05 DIAGNOSIS — N183 Chronic kidney disease, stage 3 (moderate): Secondary | ICD-10-CM | POA: Diagnosis not present

## 2015-02-05 DIAGNOSIS — D631 Anemia in chronic kidney disease: Secondary | ICD-10-CM | POA: Diagnosis not present

## 2015-02-05 LAB — IRON AND TIBC
Iron: 60 ug/dL (ref 28–170)
SATURATION RATIOS: 22 % (ref 10.4–31.8)
TIBC: 269 ug/dL (ref 250–450)
UIBC: 209 ug/dL

## 2015-02-05 LAB — FERRITIN: FERRITIN: 1723 ng/mL — AB (ref 11–307)

## 2015-02-05 MED ORDER — EPOETIN ALFA 10000 UNIT/ML IJ SOLN
INTRAMUSCULAR | Status: AC
  Filled 2015-02-05: qty 1

## 2015-02-05 MED ORDER — EPOETIN ALFA 20000 UNIT/ML IJ SOLN
INTRAMUSCULAR | Status: AC
  Filled 2015-02-05: qty 1

## 2015-02-05 MED ORDER — EPOETIN ALFA 40000 UNIT/ML IJ SOLN: 30000.0000 [IU] | INTRAMUSCULAR | Status: DC

## 2015-02-07 LAB — POCT HEMOGLOBIN-HEMACUE: HEMOGLOBIN: 12 g/dL (ref 12.0–15.0)

## 2015-02-10 ENCOUNTER — Ambulatory Visit: Payer: Medicare Other | Admitting: Endocrinology

## 2015-02-12 ENCOUNTER — Encounter: Payer: Self-pay | Admitting: Endocrinology

## 2015-02-12 ENCOUNTER — Ambulatory Visit (INDEPENDENT_AMBULATORY_CARE_PROVIDER_SITE_OTHER): Payer: Medicare Other | Admitting: Endocrinology

## 2015-02-12 ENCOUNTER — Encounter: Payer: Medicare Other | Admitting: Nutrition

## 2015-02-12 ENCOUNTER — Telehealth: Payer: Self-pay

## 2015-02-12 ENCOUNTER — Other Ambulatory Visit: Payer: Self-pay

## 2015-02-12 VITALS — BP 114/70 | HR 113 | Temp 98.4°F | Wt 161.0 lb

## 2015-02-12 DIAGNOSIS — Z794 Long term (current) use of insulin: Principal | ICD-10-CM

## 2015-02-12 DIAGNOSIS — Z713 Dietary counseling and surveillance: Secondary | ICD-10-CM | POA: Diagnosis not present

## 2015-02-12 DIAGNOSIS — E119 Type 2 diabetes mellitus without complications: Secondary | ICD-10-CM | POA: Diagnosis not present

## 2015-02-12 DIAGNOSIS — E139 Other specified diabetes mellitus without complications: Secondary | ICD-10-CM | POA: Diagnosis not present

## 2015-02-12 DIAGNOSIS — IMO0001 Reserved for inherently not codable concepts without codable children: Secondary | ICD-10-CM

## 2015-02-12 DIAGNOSIS — E785 Hyperlipidemia, unspecified: Secondary | ICD-10-CM | POA: Diagnosis not present

## 2015-02-12 DIAGNOSIS — L299 Pruritus, unspecified: Secondary | ICD-10-CM | POA: Diagnosis not present

## 2015-02-12 DIAGNOSIS — I10 Essential (primary) hypertension: Secondary | ICD-10-CM | POA: Diagnosis not present

## 2015-02-12 DIAGNOSIS — J150 Pneumonia due to Klebsiella pneumoniae: Secondary | ICD-10-CM | POA: Diagnosis not present

## 2015-02-12 DIAGNOSIS — R110 Nausea: Secondary | ICD-10-CM | POA: Diagnosis not present

## 2015-02-12 DIAGNOSIS — N12 Tubulo-interstitial nephritis, not specified as acute or chronic: Secondary | ICD-10-CM | POA: Diagnosis not present

## 2015-02-12 DIAGNOSIS — E0822 Diabetes mellitus due to underlying condition with diabetic chronic kidney disease: Secondary | ICD-10-CM | POA: Diagnosis not present

## 2015-02-12 DIAGNOSIS — D649 Anemia, unspecified: Secondary | ICD-10-CM | POA: Diagnosis not present

## 2015-02-12 DIAGNOSIS — K219 Gastro-esophageal reflux disease without esophagitis: Secondary | ICD-10-CM | POA: Diagnosis not present

## 2015-02-12 DIAGNOSIS — Z940 Kidney transplant status: Secondary | ICD-10-CM | POA: Diagnosis not present

## 2015-02-12 MED ORDER — INSULIN ISOPHANE HUMAN 100 UNIT/ML KWIKPEN: 6.0000 [IU] | PEN_INJECTOR | Freq: Every day | SUBCUTANEOUS | Status: DC

## 2015-02-12 MED ORDER — INSULIN NPH (HUMAN) (ISOPHANE) 100 UNIT/ML ~~LOC~~ SUSP: 6.0000 [IU] | Freq: Every day | SUBCUTANEOUS | Status: DC

## 2015-02-12 NOTE — Progress Notes (Signed)
Subjective:    Patient ID: [name], female    DOB: Jul 20, 1985, 29 y.o.   MRN: [id number]  HPI Pt returns for f/u of diabetes mellitus: DM type: 1 Dx'ed: 2012, when she was on prednisone Complications: polyneuropathy and renal failure (transplant in 2007) Therapy: insulin since soon after dx GDM: (G0) DKA: several episodes, most recently in [place]. Severe hypoglycemia: never Pancreatitis: never Other: she has been on pump rx since early 2016; she is chronically on prednisone.  Interval history:  She takes these pump settings: basal rate of 2 units/hr, 24 HRS per day mealtime bolus of 1 unit/10 grams  "correction bolus" of 1 unit/50 mg% over 150.   She is still disconnecting her pump at night.  Meter is downloaded today, and the printout is scanned into the record.  It varies from 129-400.  It is highest in am.  I discussed with [name], RN, who says pt finds pump too uncomfortable at night Past Medical History  Diagnosis Date  . Chronic kidney disease   . Hypertension   . Hyperlipidemia   . Diabetes mellitus     Pt reports diagnosis in June 2011  . UTI (urinary tract infection) 01/09/2015    Past Surgical History  Procedure Laterality Date  . Kidney transplant  2007  . Thyroid lobectomy    . Renal biopsy Bilateral 2003    Social History   Social History  . Marital Status: Single    Spouse Name: N/A  . Number of Children: N/A  . Years of Education: N/A   Occupational History  . Not on file.   Social History Main Topics  . Smoking status: Never Smoker   . Smokeless tobacco: Never Used  . Alcohol Use: No  . Drug Use: No  . Sexual Activity: Yes    Birth Control/ Protection: None   Other Topics Concern  . Not on file   Social History Narrative    Current Outpatient Prescriptions on File Prior to Visit  Medication Sig Dispense Refill  . acetaminophen (TYLENOL) 500 MG tablet Take 1,000 mg by mouth every 6 (six) hours as needed for mild pain.     . calcium carbonate (TUMS) 500 MG chewable tablet Chew 2 tablets by mouth 3 (three) times a week. Takes on Monday, wednesdays, and fridays    . famotidine (PEPCID) 20 MG tablet Take 1 tablet (20 mg total) by mouth daily. 30 tablet 0  . glucose blood (BAYER CONTOUR NEXT TEST) test strip Check 8 times per day, and lancets 300 each 12  . hydrOXYzine (ATARAX) 25 MG tablet Take 25 mg by mouth 2 (two) times daily as needed for itching.     Marland Kitchen lisinopril-hydrochlorothiazide (PRINZIDE,ZESTORETIC) 20-25 MG tablet Take 1 tablet by mouth daily as needed (blood pressure).   5  . mycophenolate (MYFORTIC) 360 MG TBEC Take 720 mg by mouth 2 (two) times daily.     Marland Kitchen NOVOLOG 100 UNIT/ML injection For use in pump, total of 80 units per day. 30 mL 11  . pantoprazole (PROTONIX) 20 MG tablet Take 1 tablet (20 mg total) by mouth daily. 30 tablet 0  . predniSONE (DELTASONE) 5 MG tablet Take 5 mg by mouth at bedtime.     . promethazine (PHENERGAN) 12.5 MG tablet Take 1 tablet (12.5 mg total) by mouth every 6 (six) hours as needed for nausea or vomiting. 30 tablet 0  . simvastatin (ZOCOR) 20 MG tablet Take 20 mg by mouth every evening.    Marland Kitchen  sulfamethoxazole-trimethoprim (BACTRIM DS,SEPTRA DS) 800-160 MG tablet Take 1 tablet by mouth every 12 (twelve) hours. 4 tablet 0  . tacrolimus (PROGRAF) 1 MG capsule Take 2 mg by mouth 2 (two) times daily.      No current facility-administered medications on file prior to visit.    Allergies  Allergen Reactions  . Motrin [Ibuprofen] Shortness Of Breath and Itching    Per pt  . Banana Other (See Comments)    Sick on the stomach  . Diphenhydramine Hcl     REACTION: Stopped breathing in ICU  . Iron Dextran     REACTION: vein irritation  . Shellfish Allergy Hives    Family History  Problem Relation Age of Onset  . Arthritis Mother   . Diabetes Mother   . Hypertension Mother     BP 114/70 mmHg  Pulse 113  Temp(Src) 98.4 F (36.9 C) (Oral)  Wt 161 lb (73.029 kg)   SpO2 97%  Review of Systems She denies hypoglycemia.      Objective:   Physical Exam VITAL SIGNS:  See vs page GENERAL: no distress Pulses: dorsalis pedis intact bilat.   MSK: no deformity of the feet CV: no leg edema Skin:  no ulcer on the feet.  normal color and temp on the feet. Neuro: sensation is intact to touch on the feet.    Lab Results  Component Value Date   HGBA1C 13.9 11/14/2014      Assessment & Plan:  DM: ongoing poor glycemic control. Noncompliance with pump settings, persistent: I'll work around this as best I can, which is to add HS-NPH, while the pump is off.  Patient is advised the following: Patient Instructions  check your blood sugar 4 times a day: before the 3 meals, and at bedtime.  also check if you have symptoms of your blood sugar being too high or too low.  please keep a record of the readings and bring it to your next appointment here.  You can write it on any piece of paper.  please call us sooner if your blood sugar goes below 70, or if you have a lot of readings over 200.  continue basal rate of 2 units/hr, 24 hrs per day (except for when the pump is off at night).   continue mealtime bolus of 1 unit/ 10 grams carbohydrate.   continue correction bolus (which some people call "sensitivity," or "insulin sensitivity ratio," or just "isr") of 1 unit for each 50 by which your glucose exceeds 150 Please come back for a follow-up appointment in 2-3 weeks.   i have sent a prescription to your pharmacy, to add 6 units of NPH insulin with supper.  You will need to take this several hours before you stop the pump at bedtime.

## 2015-02-12 NOTE — Telephone Encounter (Signed)
Ok, i have sent a prescription to your pharmacy, to change to novolin in vial

## 2015-02-12 NOTE — Telephone Encounter (Signed)
I contacted the pt and left a voicemail advising of medication change.

## 2015-02-12 NOTE — Patient Outreach (Signed)
[place] [facility]) Care Management  02/12/2015  [name] [birth year] [id number]   Assessment: Initial home visit scheduled- RNCM called prior to arrival-no answer-HIPPA compliant message left. No one was home when [facility] arrived.   Plan: Await return phone call and follow up with member next week if no return call.  [name], RN, MSN, [place] Coordinator Cell: [phone]

## 2015-02-12 NOTE — Telephone Encounter (Signed)
Received notice from the pt's pharmacy that the Humulin N kwikpen is not covered under her insurance. Please advise if we should proceed with a PA or change to the Novolin Brand. Novolin is not available in a [employer]

## 2015-02-12 NOTE — Progress Notes (Signed)
Pump download shows that this patient is not bolusing for meals and snacks during the day and not wearing her pump during the night!  Only testing 1 time/day most days. She is changing her infusion set q 4-6 days now.   During the day, she snacks on nachos with cheese, cereal with milk, Bojanges potatoes.  No insulin is given for these meals. Wrote for her to put into her pump 30g for the potatoes, 60 for the cereal and nachos, and 30 for the whole roll of Lifesavers.  She agreed to do this.   Michela Pitcher is willing to do an injection of NPH at HS, but "does not like to wear the pump at night." Dr. Loanne Drilling notified of patients noncompliance.   Stressed need to take insulin before all meals and snacks and told her that her kidney function is getting worse. Explained the connection between kidney function, and getting her blood sugars down.  She reported good understanding of this and agreed to take insulin for all of her meals/snacks

## 2015-02-12 NOTE — Patient Instructions (Signed)
Take insulin for all meals and snacks Do no remove pump at bedtime. Talk to Dr. Loanne Drilling about maybe taking pump off, and giving an injection of NPH insulin at HS

## 2015-02-12 NOTE — Patient Instructions (Addendum)
check your blood sugar 4 times a day: before the 3 meals, and at bedtime.  also check if you have symptoms of your blood sugar being too high or too low.  please keep a record of the readings and bring it to your next appointment here.  You can write it on any piece of paper.  please call us sooner if your blood sugar goes below 70, or if you have a lot of readings over 200.  continue basal rate of 2 units/hr, 24 hrs per day (except for when the pump is off at night).   continue mealtime bolus of 1 unit/ 10 grams carbohydrate.   continue correction bolus (which some people call "sensitivity," or "insulin sensitivity ratio," or just "isr") of 1 unit for each 50 by which your glucose exceeds 150 Please come back for a follow-up appointment in 2-3 weeks.   i have sent a prescription to your pharmacy, to add 6 units of NPH insulin with supper.  You will need to take this several hours before you stop the pump at bedtime.

## 2015-02-14 ENCOUNTER — Telehealth: Payer: Self-pay

## 2015-02-14 ENCOUNTER — Telehealth: Payer: Self-pay | Admitting: Endocrinology

## 2015-02-14 NOTE — Telephone Encounter (Signed)
Patient called stating that she needs to speak with [facility] regarding her diabetic supplies    Please advise    Thank you

## 2015-02-14 NOTE — Telephone Encounter (Signed)
Left voicemail advising we have completed pump supply paper work and the paper work has been faxed.

## 2015-02-14 NOTE — Telephone Encounter (Signed)
I contacted the pt. She wanted to let me know [name] will be faxing paper work for her pump supplies for Dr. Loanne Drilling to fill out. Pt advised we would be on the lookout for the paper work. Pt will be contacted once paper work is received and completed.

## 2015-02-17 ENCOUNTER — Telehealth: Payer: Self-pay | Admitting: Endocrinology

## 2015-02-17 MED ORDER — "INSULIN SYRINGE-NEEDLE U-100 25G X 5/8"" 1 ML MISC": Status: DC

## 2015-02-17 NOTE — Telephone Encounter (Signed)
Rx submitted

## 2015-02-17 NOTE — Telephone Encounter (Signed)
Patient stated that pharmacy told Dr Loanne Drilling to order for BD syringes 6 ml for her insulin vials.please advise

## 2015-02-20 ENCOUNTER — Encounter (HOSPITAL_COMMUNITY)
Admission: RE | Admit: 2015-02-20 | Discharge: 2015-02-20 | Disposition: A | Discharge status: Home / Self Care | Payer: Medicare Other | Source: Ambulatory Visit | Attending: Nephrology | Admitting: Nephrology

## 2015-02-20 DIAGNOSIS — N183 Chronic kidney disease, stage 3 (moderate): Secondary | ICD-10-CM | POA: Diagnosis not present

## 2015-02-20 DIAGNOSIS — D631 Anemia in chronic kidney disease: Secondary | ICD-10-CM | POA: Diagnosis not present

## 2015-02-20 LAB — POCT HEMOGLOBIN-HEMACUE: Hemoglobin: 10.1 g/dL — ABNORMAL LOW (ref 12.0–15.0)

## 2015-02-20 MED ORDER — EPOETIN ALFA 10000 UNIT/ML IJ SOLN
INTRAMUSCULAR | Status: AC
  Administered 2015-02-20: 10000 [IU] via SUBCUTANEOUS
  Filled 2015-02-20: qty 1

## 2015-02-20 MED ORDER — EPOETIN ALFA 20000 UNIT/ML IJ SOLN
INTRAMUSCULAR | Status: AC
  Administered 2015-02-20: 20000 [IU] via SUBCUTANEOUS
  Filled 2015-02-20: qty 1

## 2015-02-20 MED ORDER — EPOETIN ALFA 40000 UNIT/ML IJ SOLN: 30000.0000 [IU] | INTRAMUSCULAR | Status: DC

## 2015-02-27 ENCOUNTER — Other Ambulatory Visit: Payer: Self-pay

## 2015-02-27 NOTE — Patient Outreach (Signed)
[place] [facility]) Care Management  02/27/2015  [name] January 04, 1986 [id number]  Assessment: RNCM called to address care management needs. No answer. HIPPA compliant message left.   Plan: follow up call next week.  [name], RN, MSN, [place] Coordinator Cell: [phone]

## 2015-03-05 ENCOUNTER — Ambulatory Visit (INDEPENDENT_AMBULATORY_CARE_PROVIDER_SITE_OTHER): Payer: Medicare Other | Admitting: Endocrinology

## 2015-03-05 ENCOUNTER — Encounter: Payer: Self-pay | Admitting: Endocrinology

## 2015-03-05 ENCOUNTER — Encounter: Payer: Medicare Other | Attending: Endocrinology | Admitting: Nutrition

## 2015-03-05 ENCOUNTER — Other Ambulatory Visit: Payer: Self-pay

## 2015-03-05 VITALS — BP 124/82 | HR 111 | Temp 98.1°F | Ht 66.0 in | Wt 165.0 lb

## 2015-03-05 DIAGNOSIS — E119 Type 2 diabetes mellitus without complications: Secondary | ICD-10-CM

## 2015-03-05 DIAGNOSIS — Z794 Long term (current) use of insulin: Secondary | ICD-10-CM

## 2015-03-05 DIAGNOSIS — D649 Anemia, unspecified: Secondary | ICD-10-CM | POA: Diagnosis not present

## 2015-03-05 DIAGNOSIS — IMO0001 Reserved for inherently not codable concepts without codable children: Secondary | ICD-10-CM

## 2015-03-05 DIAGNOSIS — Z713 Dietary counseling and surveillance: Secondary | ICD-10-CM | POA: Diagnosis not present

## 2015-03-05 DIAGNOSIS — E0822 Diabetes mellitus due to underlying condition with diabetic chronic kidney disease: Secondary | ICD-10-CM | POA: Insufficient documentation

## 2015-03-05 DIAGNOSIS — Z940 Kidney transplant status: Secondary | ICD-10-CM | POA: Diagnosis not present

## 2015-03-05 LAB — POCT GLYCOSYLATED HEMOGLOBIN (HGB A1C): HEMOGLOBIN A1C: 10.8

## 2015-03-05 MED ORDER — INSULIN DEGLUDEC 100 UNIT/ML ~~LOC~~ SOPN: 50.0000 [IU] | PEN_INJECTOR | Freq: Every day | SUBCUTANEOUS | Status: DC

## 2015-03-05 NOTE — Progress Notes (Signed)
Subjective:    Patient ID: [name], female    DOB: 10-12-1985, 29 y.o.   MRN: [id number]  HPI Pt returns for f/u of diabetes mellitus: DM type: 1 Dx'ed: 2012, when she was on prednisone Complications: polyneuropathy and renal failure (transplant in 2007) Therapy: insulin since soon after dx GDM: (G0) DKA: several episodes, most recently in [place]. Severe hypoglycemia: never Pancreatitis: never Other: she has been on pump rx since early 2016; she is chronically on prednisone.  Interval history:  She takes these pump settings: basal rate of 2 units/hr, 24 HRS per day mealtime bolus of 1 unit/10 grams  "correction bolus" of 1 unit/50 mg% over 150.   She is still disconnecting her pump at night.  Meter is downloaded today, and the printout is scanned into the record.  It varies from 129-400.  It is highest in am.   I discussed with [name], RN, who says pt continues to struggle with her pump.  pt states she feels well in general.  Meter is downloaded today, and the printout is scanned into the record.  CBG's vary widely.  There is no trend throughout the day. Past Medical History  Diagnosis Date  . Chronic kidney disease   . Hypertension   . Hyperlipidemia   . Diabetes mellitus     Pt reports diagnosis in June 2011  . UTI (urinary tract infection) 01/09/2015    Past Surgical History  Procedure Laterality Date  . Kidney transplant  2007  . Thyroid lobectomy    . Renal biopsy Bilateral 2003    Social History   Social History  . Marital Status: Single    Spouse Name: N/A  . Number of Children: N/A  . Years of Education: N/A   Occupational History  . Not on file.   Social History Main Topics  . Smoking status: Never Smoker   . Smokeless tobacco: Never Used  . Alcohol Use: No  . Drug Use: No  . Sexual Activity: Yes    Birth Control/ Protection: None   Other Topics Concern  . Not on file   Social History Narrative    Current Outpatient  Prescriptions on File Prior to Visit  Medication Sig Dispense Refill  . acetaminophen (TYLENOL) 500 MG tablet Take 1,000 mg by mouth every 6 (six) hours as needed for mild pain.    . calcium carbonate (TUMS) 500 MG chewable tablet Chew 2 tablets by mouth 3 (three) times a week. Takes on Monday, wednesdays, and fridays    . famotidine (PEPCID) 20 MG tablet Take 1 tablet (20 mg total) by mouth daily. 30 tablet 0  . glucose blood (BAYER CONTOUR NEXT TEST) test strip Check 8 times per day, and lancets 300 each 12  . hydrOXYzine (ATARAX) 25 MG tablet Take 25 mg by mouth 2 (two) times daily as needed for itching.     . Insulin Syringe-Needle U-100 (B-D INSULIN SYRINGE 1CC/25G) 25G X 5/8" 1 ML MISC Use to inject insulin 1 time per day 100 each 2  . lisinopril-hydrochlorothiazide (PRINZIDE,ZESTORETIC) 20-25 MG tablet Take 1 tablet by mouth daily as needed (blood pressure).   5  . mycophenolate (MYFORTIC) 360 MG TBEC Take 720 mg by mouth 2 (two) times daily.     . pantoprazole (PROTONIX) 20 MG tablet Take 1 tablet (20 mg total) by mouth daily. 30 tablet 0  . predniSONE (DELTASONE) 5 MG tablet Take 5 mg by mouth at bedtime.     . promethazine (  PHENERGAN) 12.5 MG tablet Take 1 tablet (12.5 mg total) by mouth every 6 (six) hours as needed for nausea or vomiting. 30 tablet 0  . simvastatin (ZOCOR) 20 MG tablet Take 20 mg by mouth every evening.    . sulfamethoxazole-trimethoprim (BACTRIM DS,SEPTRA DS) 800-160 MG tablet Take 1 tablet by mouth every 12 (twelve) hours. (Patient not taking: Reported on 03/06/2015) 4 tablet 0  . tacrolimus (PROGRAF) 1 MG capsule Take 2 mg by mouth 2 (two) times daily.      No current facility-administered medications on file prior to visit.    Allergies  Allergen Reactions  . Motrin [Ibuprofen] Shortness Of Breath and Itching    Per pt  . Banana Other (See Comments)    Sick on the stomach  . Diphenhydramine Hcl     REACTION: Stopped breathing in ICU  . Iron Dextran      REACTION: vein irritation  . Shellfish Allergy Hives    Family History  Problem Relation Age of Onset  . Arthritis Mother   . Diabetes Mother   . Hypertension Mother     BP 124/82 mmHg  Pulse 111  Temp(Src) 98.1 F (36.7 C) (Oral)  Ht 5\' 6"  (1.676 m)  Wt 165 lb (74.844 kg)  BMI 26.64 kg/m2  SpO2 96%  Review of Systems She denies hypoglycemia    Objective:   Physical Exam VITAL SIGNS:  See vs page GENERAL: no distress Pulses: dorsalis pedis intact bilat.   MSK: no deformity of the feet CV: no leg edema Skin:  no ulcer on the feet.  normal color and temp on the feet. Neuro: sensation is intact to touch on the feet  Lab Results  Component Value Date   HGBA1C 10.8 03/05/2015      Assessment & Plan:  DM: therapy limited by ongoing noncompliance.  i'll do the best i can.   Patient is advised the following: Patient Instructions  Please stop the pump, and start taking "tresiba" (slow-release insulin).  i have sent a prescription to your pharmacy. However, please continue the pump for 3 hrs after the first tresiba shot. Please call us next week, to tell us how the blood sugar is doing.  Please come back for a follow-up appointment in 1 month.  check your blood sugar 4 times a day: before the 3 meals, and at bedtime.  also check if you have symptoms of your blood sugar being too high or too low.  please keep a record of the readings and bring it to your next appointment here.  You can write it on any piece of paper.  please call us sooner if your blood sugar goes below 70, or if you have a lot of readings over 200.

## 2015-03-05 NOTE — Progress Notes (Signed)
Pump download shows patient not wearing pump for 5 days.  Says was very sick--vomiting and did not put her pump on.  Stressed the need to never take her pump off.  Told her that this was why she was so sick.  She can not go without her insulin!!!! She says that she is not taking the NPH insulin when she takes her pump off at night, because she can not afford it.   I asked her why she is not bolusing for her meals and taking her pump off.  She says she forgets to bolus, and forgets does not want to wear her pump at night.   She suggested she go back on Lantus.  I suggested to her that she needs to be on the "new longer acting insulins, that do not require injections q 24 hours--can delay for 30 hours or so without any problems of high blood sugars. Reminded her of the need to take insulin before each meal with injections as well.  Told her to discuss this with Dr. Loanne Drilling when she sees him in a few min. She can give me no reason for not keeping her pump on, nor for taking any meal time boluses, despite me telling her of the need to do this at every visit.

## 2015-03-05 NOTE — Patient Instructions (Signed)
Please stop the pump, and start taking "tresiba" (slow-release insulin).  i have sent a prescription to your pharmacy. However, please continue the pump for 3 hrs after the first tresiba shot. Please call us next week, to tell us how the blood sugar is doing.  Please come back for a follow-up appointment in 1 month.  check your blood sugar 4 times a day: before the 3 meals, and at bedtime.  also check if you have symptoms of your blood sugar being too high or too low.  please keep a record of the readings and bring it to your next appointment here.  You can write it on any piece of paper.  please call us sooner if your blood sugar goes below 70, or if you have a lot of readings over 200.

## 2015-03-05 NOTE — Patient Instructions (Addendum)
Discuss with Dr. Loanne Drilling the possiblity of going back on injections with the new longer acting lantus or Levemir. Always take your meal time insulin Do not take pump off.

## 2015-03-06 ENCOUNTER — Ambulatory Visit: Payer: Medicare Other | Admitting: Endocrinology

## 2015-03-06 ENCOUNTER — Other Ambulatory Visit: Payer: Self-pay

## 2015-03-06 ENCOUNTER — Encounter (HOSPITAL_COMMUNITY)
Admission: RE | Admit: 2015-03-06 | Discharge: 2015-03-06 | Disposition: A | Discharge status: Home / Self Care | Payer: Medicare Other | Source: Ambulatory Visit | Attending: Nephrology | Admitting: Nephrology

## 2015-03-06 DIAGNOSIS — N183 Chronic kidney disease, stage 3 (moderate): Secondary | ICD-10-CM | POA: Diagnosis not present

## 2015-03-06 DIAGNOSIS — D631 Anemia in chronic kidney disease: Secondary | ICD-10-CM | POA: Diagnosis not present

## 2015-03-06 LAB — IRON AND TIBC
IRON: 109 ug/dL (ref 28–170)
Saturation Ratios: 40 % — ABNORMAL HIGH (ref 10.4–31.8)
TIBC: 272 ug/dL (ref 250–450)
UIBC: 163 ug/dL

## 2015-03-06 LAB — FERRITIN: FERRITIN: 1358 ng/mL — AB (ref 11–307)

## 2015-03-06 LAB — POCT HEMOGLOBIN-HEMACUE: Hemoglobin: 11.1 g/dL — ABNORMAL LOW (ref 12.0–15.0)

## 2015-03-06 MED ORDER — EPOETIN ALFA 40000 UNIT/ML IJ SOLN: 30000.0000 [IU] | INTRAMUSCULAR | Status: DC

## 2015-03-06 MED ORDER — EPOETIN ALFA 10000 UNIT/ML IJ SOLN
INTRAMUSCULAR | Status: AC
  Administered 2015-03-06: 10000 [IU] via SUBCUTANEOUS
  Filled 2015-03-06: qty 1

## 2015-03-06 MED ORDER — EPOETIN ALFA 20000 UNIT/ML IJ SOLN
INTRAMUSCULAR | Status: AC
  Administered 2015-03-06: 20000 [IU] via SUBCUTANEOUS
  Filled 2015-03-06: qty 1

## 2015-03-06 NOTE — Patient Outreach (Signed)
[name]) Care Management  03/06/2015  [name] February 25, 1986 [id number]  Assessment-RNCM called to assess transition of care and care management needs. Member is seeing endocrinoligist on frequent basis regarding diabetes. Denies medication issues, denies transportation needs. Member continues to work part time. [name] discussed her mothers passing has been hard on her, but states she has supportive family and it is getting better. Declines social work assistance regarding grief counseling. Member denies any needs at this time.   RNCM discussed case closure. Member is agreeable and is aware that if needs change she can call with questions or be re-referred.   Plan: close case.  [name], RN, MSN, [place] Coordinator Cell: [phone]

## 2015-03-20 ENCOUNTER — Encounter (HOSPITAL_COMMUNITY)
Admission: RE | Admit: 2015-03-20 | Discharge: 2015-03-20 | Disposition: A | Discharge status: Home / Self Care | Payer: Medicare Other | Source: Ambulatory Visit | Attending: Nephrology | Admitting: Nephrology

## 2015-03-20 DIAGNOSIS — D631 Anemia in chronic kidney disease: Secondary | ICD-10-CM | POA: Diagnosis not present

## 2015-03-20 DIAGNOSIS — N183 Chronic kidney disease, stage 3 (moderate): Secondary | ICD-10-CM | POA: Diagnosis not present

## 2015-03-20 LAB — POCT HEMOGLOBIN-HEMACUE: HEMOGLOBIN: 11.3 g/dL — AB (ref 12.0–15.0)

## 2015-03-20 MED ORDER — EPOETIN ALFA 20000 UNIT/ML IJ SOLN
INTRAMUSCULAR | Status: AC
  Administered 2015-03-20: 20000 [IU] via SUBCUTANEOUS
  Filled 2015-03-20: qty 1

## 2015-03-20 MED ORDER — EPOETIN ALFA 40000 UNIT/ML IJ SOLN: 30000.0000 [IU] | INTRAMUSCULAR | Status: DC

## 2015-03-20 MED ORDER — EPOETIN ALFA 10000 UNIT/ML IJ SOLN
INTRAMUSCULAR | Status: AC
  Administered 2015-03-20: 10000 [IU] via SUBCUTANEOUS
  Filled 2015-03-20: qty 1

## 2015-04-03 ENCOUNTER — Encounter (HOSPITAL_COMMUNITY)
Admission: RE | Admit: 2015-04-03 | Discharge: 2015-04-03 | Disposition: A | Discharge status: Home / Self Care | Payer: Medicare Other | Source: Ambulatory Visit | Attending: Nephrology | Admitting: Nephrology

## 2015-04-03 DIAGNOSIS — N183 Chronic kidney disease, stage 3 (moderate): Secondary | ICD-10-CM | POA: Diagnosis not present

## 2015-04-03 DIAGNOSIS — I10 Essential (primary) hypertension: Secondary | ICD-10-CM | POA: Diagnosis not present

## 2015-04-03 DIAGNOSIS — L299 Pruritus, unspecified: Secondary | ICD-10-CM | POA: Diagnosis not present

## 2015-04-03 DIAGNOSIS — E139 Other specified diabetes mellitus without complications: Secondary | ICD-10-CM | POA: Diagnosis not present

## 2015-04-03 DIAGNOSIS — K219 Gastro-esophageal reflux disease without esophagitis: Secondary | ICD-10-CM | POA: Diagnosis not present

## 2015-04-03 DIAGNOSIS — D631 Anemia in chronic kidney disease: Secondary | ICD-10-CM | POA: Diagnosis not present

## 2015-04-03 DIAGNOSIS — D649 Anemia, unspecified: Secondary | ICD-10-CM | POA: Diagnosis not present

## 2015-04-03 DIAGNOSIS — Z940 Kidney transplant status: Secondary | ICD-10-CM | POA: Diagnosis not present

## 2015-04-03 DIAGNOSIS — N12 Tubulo-interstitial nephritis, not specified as acute or chronic: Secondary | ICD-10-CM | POA: Diagnosis not present

## 2015-04-03 DIAGNOSIS — E785 Hyperlipidemia, unspecified: Secondary | ICD-10-CM | POA: Diagnosis not present

## 2015-04-03 LAB — IRON AND TIBC
IRON: 78 ug/dL (ref 28–170)
Saturation Ratios: 33 % — ABNORMAL HIGH (ref 10.4–31.8)
TIBC: 238 ug/dL — ABNORMAL LOW (ref 250–450)
UIBC: 160 ug/dL

## 2015-04-03 LAB — POCT HEMOGLOBIN-HEMACUE: Hemoglobin: 11.8 g/dL — ABNORMAL LOW (ref 12.0–15.0)

## 2015-04-03 LAB — FERRITIN: Ferritin: 1271 ng/mL — ABNORMAL HIGH (ref 11–307)

## 2015-04-03 MED ORDER — EPOETIN ALFA 10000 UNIT/ML IJ SOLN
INTRAMUSCULAR | Status: AC
  Administered 2015-04-03: 10000 [IU] via SUBCUTANEOUS
  Filled 2015-04-03: qty 1

## 2015-04-03 MED ORDER — EPOETIN ALFA 20000 UNIT/ML IJ SOLN
INTRAMUSCULAR | Status: AC
  Administered 2015-04-03: 20000 [IU] via SUBCUTANEOUS
  Filled 2015-04-03: qty 1

## 2015-04-03 MED ORDER — EPOETIN ALFA 40000 UNIT/ML IJ SOLN: 30000.0000 [IU] | INTRAMUSCULAR | Status: DC

## 2015-04-17 ENCOUNTER — Encounter (HOSPITAL_COMMUNITY)
Admission: RE | Admit: 2015-04-17 | Discharge: 2015-04-17 | Disposition: A | Discharge status: Home / Self Care | Payer: Medicare Other | Source: Ambulatory Visit | Attending: Nephrology | Admitting: Nephrology

## 2015-04-17 DIAGNOSIS — N183 Chronic kidney disease, stage 3 (moderate): Secondary | ICD-10-CM | POA: Insufficient documentation

## 2015-04-17 DIAGNOSIS — D631 Anemia in chronic kidney disease: Secondary | ICD-10-CM | POA: Diagnosis not present

## 2015-04-17 MED ORDER — EPOETIN ALFA 40000 UNIT/ML IJ SOLN: 30000.0000 [IU] | INTRAMUSCULAR | Status: DC

## 2015-04-18 LAB — POCT HEMOGLOBIN-HEMACUE: HEMOGLOBIN: 12.5 g/dL (ref 12.0–15.0)

## 2015-04-28 ENCOUNTER — Encounter: Payer: Medicare Other | Attending: Endocrinology | Admitting: Nutrition

## 2015-04-28 ENCOUNTER — Ambulatory Visit (INDEPENDENT_AMBULATORY_CARE_PROVIDER_SITE_OTHER): Payer: Medicare Other | Admitting: Endocrinology

## 2015-04-28 ENCOUNTER — Encounter: Payer: Self-pay | Admitting: Endocrinology

## 2015-04-28 VITALS — BP 128/88 | HR 102 | Temp 98.3°F | Ht 66.0 in | Wt 171.0 lb

## 2015-04-28 DIAGNOSIS — E119 Type 2 diabetes mellitus without complications: Secondary | ICD-10-CM | POA: Diagnosis not present

## 2015-04-28 DIAGNOSIS — Z794 Long term (current) use of insulin: Secondary | ICD-10-CM

## 2015-04-28 DIAGNOSIS — E0822 Diabetes mellitus due to underlying condition with diabetic chronic kidney disease: Secondary | ICD-10-CM | POA: Diagnosis not present

## 2015-04-28 DIAGNOSIS — IMO0001 Reserved for inherently not codable concepts without codable children: Secondary | ICD-10-CM

## 2015-04-28 DIAGNOSIS — Z940 Kidney transplant status: Secondary | ICD-10-CM | POA: Diagnosis not present

## 2015-04-28 DIAGNOSIS — Z7689 Persons encountering health services in other specified circumstances: Secondary | ICD-10-CM

## 2015-04-28 DIAGNOSIS — E1065 Type 1 diabetes mellitus with hyperglycemia: Secondary | ICD-10-CM

## 2015-04-28 DIAGNOSIS — E108 Type 1 diabetes mellitus with unspecified complications: Secondary | ICD-10-CM

## 2015-04-28 DIAGNOSIS — Z713 Dietary counseling and surveillance: Secondary | ICD-10-CM | POA: Diagnosis not present

## 2015-04-28 DIAGNOSIS — IMO0002 Reserved for concepts with insufficient information to code with codable children: Secondary | ICD-10-CM

## 2015-04-28 DIAGNOSIS — N390 Urinary tract infection, site not specified: Secondary | ICD-10-CM | POA: Diagnosis not present

## 2015-04-28 MED ORDER — INSULIN DEGLUDEC 100 UNIT/ML ~~LOC~~ SOPN: 55.0000 [IU] | PEN_INJECTOR | Freq: Every day | SUBCUTANEOUS | Status: DC

## 2015-04-28 NOTE — Progress Notes (Signed)
Subjective:    Patient ID: [name], female    DOB: January 02, 1986, 30 y.o.   MRN: [id number]  HPI Pt returns for f/u of diabetes mellitus: DM type: 1 Dx'ed: 2012, when she was on prednisone Complications: polyneuropathy and renal failure (transplant in 2007) Therapy: insulin since soon after dx GDM: (G0) DKA: several episodes, most recently in [place]. Severe hypoglycemia: never Pancreatitis: never Other: she took pump rx during 2016, but was changed to qd insulin, due to poor results; she is chronically on prednisone; pt says she is at risk for pregnancy. Interval history: Meter is downloaded today, and the printout is scanned into the record.  CBG's again vary widely.  There is no trend throughout the day.  Pt says she never misses the tresiba.  Her prednisone was increased, but it has been back to 5 mg/d, for more than 1 month.  pt states she feels well in general. Past Medical History  Diagnosis Date  . Chronic kidney disease   . Hypertension   . Hyperlipidemia   . Diabetes mellitus     Pt reports diagnosis in June 2011  . UTI (urinary tract infection) 01/09/2015    Past Surgical History  Procedure Laterality Date  . Kidney transplant  2007  . Thyroid lobectomy    . Renal biopsy Bilateral 2003    Social History   Social History  . Marital Status: Single    Spouse Name: N/A  . Number of Children: N/A  . Years of Education: N/A   Occupational History  . Not on file.   Social History Main Topics  . Smoking status: Never Smoker   . Smokeless tobacco: Never Used  . Alcohol Use: No  . Drug Use: No  . Sexual Activity: Yes    Birth Control/ Protection: None   Other Topics Concern  . Not on file   Social History Narrative    Current Outpatient Prescriptions on File Prior to Visit  Medication Sig Dispense Refill  . acetaminophen (TYLENOL) 500 MG tablet Take 1,000 mg by mouth every 6 (six) hours as needed for mild pain.    . calcium carbonate (TUMS) 500 MG  chewable tablet Chew 2 tablets by mouth 3 (three) times a week. Takes on Monday, wednesdays, and fridays    . famotidine (PEPCID) 20 MG tablet Take 1 tablet (20 mg total) by mouth daily. 30 tablet 0  . glucose blood (BAYER CONTOUR NEXT TEST) test strip Check 8 times per day, and lancets 300 each 12  . hydrOXYzine (ATARAX) 25 MG tablet Take 25 mg by mouth 2 (two) times daily as needed for itching.     . Insulin Syringe-Needle U-100 (B-D INSULIN SYRINGE 1CC/25G) 25G X 5/8" 1 ML MISC Use to inject insulin 1 time per day 100 each 2  . lisinopril-hydrochlorothiazide (PRINZIDE,ZESTORETIC) 20-25 MG tablet Take 1 tablet by mouth daily as needed (blood pressure).   5  . mycophenolate (MYFORTIC) 360 MG TBEC Take 720 mg by mouth 2 (two) times daily.     . pantoprazole (PROTONIX) 20 MG tablet Take 1 tablet (20 mg total) by mouth daily. 30 tablet 0  . predniSONE (DELTASONE) 5 MG tablet Take 5 mg by mouth at bedtime.     . promethazine (PHENERGAN) 12.5 MG tablet Take 1 tablet (12.5 mg total) by mouth every 6 (six) hours as needed for nausea or vomiting. 30 tablet 0  . simvastatin (ZOCOR) 20 MG tablet Take 20 mg by mouth every evening.    Marland Kitchen  sulfamethoxazole-trimethoprim (BACTRIM DS,SEPTRA DS) 800-160 MG tablet Take 1 tablet by mouth every 12 (twelve) hours. 4 tablet 0  . tacrolimus (PROGRAF) 1 MG capsule Take 2 mg by mouth 2 (two) times daily.      No current facility-administered medications on file prior to visit.    Allergies  Allergen Reactions  . Motrin [Ibuprofen] Shortness Of Breath and Itching    Per pt  . Banana Other (See Comments)    Sick on the stomach  . Diphenhydramine Hcl     REACTION: Stopped breathing in ICU  . Iron Dextran     REACTION: vein irritation  . Shellfish Allergy Hives    Family History  Problem Relation Age of Onset  . Arthritis Mother   . Diabetes Mother   . Hypertension Mother     BP 128/88 mmHg  Pulse 102  Temp(Src) 98.3 F (36.8 C) (Oral)  Ht 5\' 6"  (1.676 m)   Wt 171 lb (77.565 kg)  BMI 27.61 kg/m2  SpO2 93%  Review of Systems She denies hypoglycemia.      Objective:   Physical Exam VITAL SIGNS:  See vs page GENERAL: no distress Pulses: dorsalis pedis intact bilat.   MSK: no deformity of the feet.  CV: no leg edema. Skin:  no ulcer on the feet.  normal color and temp on the feet. Neuro: sensation is intact to touch on the feet.        Assessment & Plan:  DM: she needs increased rx.   Patient is advised the following: Patient Instructions  Please increase the tresiba to 55 units daily.  Please come back for a follow-up appointment in 1 month.  In view of your medical condition, you should avoid pregnancy until we have decided it is safe.   check your blood sugar 4 times a day: before the 3 meals, and at bedtime.  also check if you have symptoms of your blood sugar being too high or too low.  please keep a record of the readings and bring it to your next appointment here.  You can write it on any piece of paper.  please call us sooner if your blood sugar goes below 70, or if you have a lot of readings over 200.

## 2015-04-28 NOTE — Patient Instructions (Signed)
Take insulin before each meal via the pump.

## 2015-04-28 NOTE — Patient Instructions (Addendum)
Please increase the tresiba to 55 units daily.  Please come back for a follow-up appointment in 1 month.  In view of your medical condition, you should avoid pregnancy until we have decided it is safe.   check your blood sugar 4 times a day: before the 3 meals, and at bedtime.  also check if you have symptoms of your blood sugar being too high or too low.  please keep a record of the readings and bring it to your next appointment here.  You can write it on any piece of paper.  please call us sooner if your blood sugar goes below 70, or if you have a lot of readings over 200.

## 2015-04-28 NOTE — Progress Notes (Signed)
[name] did not bring her meter or record of blood sugars.  When I asked her how the blood sugars were doing, she said "much better"  Said FBS were in the 100s, but that they go higher at the end of the day (10PM), because she is taking 5 mg. Of prednisone at 9PM.  She says that she connects her pump at HS and take a correction dose, because blood sugars are in the 300s and 400s.  She then disconnects the pump.    Medication: Tresiba 50u q AM (9-12PM), then hooks pump up for HS correction dose.    I took and downloaded her pump, and only 2 readings are below 350, and all are over 400 in the AM. ?????  Says she needs to stop the sweets and admits that she does not always take insulin before the meals.  Strongly encouraged her to do this, and to at least take insulin when drinking sweet drinks.  She did not agreed to this, but simply nodded that she understood what I was saying.    Not sure why she does not take the insulin before eating.  After questioning her on this, she says that she does not want to drop low, because she sometimes does not eat everything she planned to.  I told her to take the insulin immediately after eating. She nodded again at this, that she understood, but would not verbally commit.

## 2015-04-30 ENCOUNTER — Encounter: Payer: Medicare Other | Admitting: Nutrition

## 2015-04-30 ENCOUNTER — Other Ambulatory Visit (HOSPITAL_COMMUNITY): Payer: Self-pay

## 2015-04-30 ENCOUNTER — Ambulatory Visit: Payer: Medicare Other | Admitting: Endocrinology

## 2015-05-01 ENCOUNTER — Encounter (HOSPITAL_COMMUNITY)
Admission: RE | Admit: 2015-05-01 | Discharge: 2015-05-01 | Disposition: A | Discharge status: Home / Self Care | Payer: Medicare Other | Source: Ambulatory Visit | Attending: Nephrology | Admitting: Nephrology

## 2015-05-01 DIAGNOSIS — D631 Anemia in chronic kidney disease: Secondary | ICD-10-CM | POA: Diagnosis not present

## 2015-05-01 DIAGNOSIS — N183 Chronic kidney disease, stage 3 (moderate): Secondary | ICD-10-CM | POA: Diagnosis not present

## 2015-05-01 LAB — FERRITIN: Ferritin: 2097 ng/mL — ABNORMAL HIGH (ref 11–307)

## 2015-05-01 LAB — IRON AND TIBC
Iron: 93 ug/dL (ref 28–170)
SATURATION RATIOS: 40 % — AB (ref 10.4–31.8)
TIBC: 231 ug/dL — AB (ref 250–450)
UIBC: 138 ug/dL

## 2015-05-01 LAB — POCT HEMOGLOBIN-HEMACUE: HEMOGLOBIN: 11.2 g/dL — AB (ref 12.0–15.0)

## 2015-05-01 MED ORDER — EPOETIN ALFA 20000 UNIT/ML IJ SOLN
INTRAMUSCULAR | Status: AC
  Administered 2015-05-01: 20000 [IU] via SUBCUTANEOUS
  Filled 2015-05-01: qty 1

## 2015-05-01 MED ORDER — EPOETIN ALFA 40000 UNIT/ML IJ SOLN: 30000.0000 [IU] | INTRAMUSCULAR | Status: DC

## 2015-05-01 MED ORDER — EPOETIN ALFA 10000 UNIT/ML IJ SOLN
INTRAMUSCULAR | Status: AC
  Administered 2015-05-01: 10000 [IU] via SUBCUTANEOUS
  Filled 2015-05-01: qty 1

## 2015-05-11 ENCOUNTER — Emergency Department (HOSPITAL_COMMUNITY): Payer: Medicare Other

## 2015-05-11 ENCOUNTER — Emergency Department (HOSPITAL_COMMUNITY)
Admission: EM | Admit: 2015-05-11 | Discharge: 2015-05-11 | Disposition: A | Discharge status: Home / Self Care | Payer: Medicare Other | Attending: Emergency Medicine | Admitting: Emergency Medicine

## 2015-05-11 ENCOUNTER — Encounter (HOSPITAL_COMMUNITY): Payer: Self-pay | Admitting: Emergency Medicine

## 2015-05-11 DIAGNOSIS — Z794 Long term (current) use of insulin: Secondary | ICD-10-CM | POA: Diagnosis not present

## 2015-05-11 DIAGNOSIS — Z940 Kidney transplant status: Secondary | ICD-10-CM | POA: Diagnosis not present

## 2015-05-11 DIAGNOSIS — R300 Dysuria: Secondary | ICD-10-CM | POA: Insufficient documentation

## 2015-05-11 DIAGNOSIS — S99911A Unspecified injury of right ankle, initial encounter: Secondary | ICD-10-CM | POA: Diagnosis not present

## 2015-05-11 DIAGNOSIS — Y998 Other external cause status: Secondary | ICD-10-CM | POA: Insufficient documentation

## 2015-05-11 DIAGNOSIS — Y9389 Activity, other specified: Secondary | ICD-10-CM | POA: Diagnosis not present

## 2015-05-11 DIAGNOSIS — I129 Hypertensive chronic kidney disease with stage 1 through stage 4 chronic kidney disease, or unspecified chronic kidney disease: Secondary | ICD-10-CM | POA: Diagnosis not present

## 2015-05-11 DIAGNOSIS — Y9289 Other specified places as the place of occurrence of the external cause: Secondary | ICD-10-CM | POA: Insufficient documentation

## 2015-05-11 DIAGNOSIS — Z79899 Other long term (current) drug therapy: Secondary | ICD-10-CM | POA: Insufficient documentation

## 2015-05-11 DIAGNOSIS — E860 Dehydration: Secondary | ICD-10-CM

## 2015-05-11 DIAGNOSIS — R739 Hyperglycemia, unspecified: Secondary | ICD-10-CM

## 2015-05-11 DIAGNOSIS — N189 Chronic kidney disease, unspecified: Secondary | ICD-10-CM | POA: Diagnosis not present

## 2015-05-11 DIAGNOSIS — E1165 Type 2 diabetes mellitus with hyperglycemia: Secondary | ICD-10-CM | POA: Diagnosis not present

## 2015-05-11 DIAGNOSIS — E785 Hyperlipidemia, unspecified: Secondary | ICD-10-CM | POA: Insufficient documentation

## 2015-05-11 DIAGNOSIS — Z87440 Personal history of urinary (tract) infections: Secondary | ICD-10-CM | POA: Diagnosis not present

## 2015-05-11 DIAGNOSIS — M62838 Other muscle spasm: Secondary | ICD-10-CM | POA: Diagnosis not present

## 2015-05-11 DIAGNOSIS — E876 Hypokalemia: Secondary | ICD-10-CM | POA: Diagnosis not present

## 2015-05-11 DIAGNOSIS — M25571 Pain in right ankle and joints of right foot: Secondary | ICD-10-CM | POA: Diagnosis not present

## 2015-05-11 DIAGNOSIS — E669 Obesity, unspecified: Secondary | ICD-10-CM | POA: Insufficient documentation

## 2015-05-11 DIAGNOSIS — X500XXA Overexertion from strenuous movement or load, initial encounter: Secondary | ICD-10-CM | POA: Diagnosis not present

## 2015-05-11 HISTORY — DX: Kidney transplant status: Z94.0

## 2015-05-11 LAB — URINALYSIS, ROUTINE W REFLEX MICROSCOPIC
Bilirubin Urine: NEGATIVE
Glucose, UA: >1000 mg/dL — AB
Hgb urine dipstick: NEGATIVE
Ketones, ur: NEGATIVE mg/dL
Leukocytes, UA: NEGATIVE
Nitrite: NEGATIVE
Protein, ur: NEGATIVE mg/dL
Specific Gravity, Urine: 1.017 (ref 1.005–1.030)
pH: 5 (ref 5.0–8.0)

## 2015-05-11 LAB — CBC WITH DIFFERENTIAL/PLATELET
BASOS PCT: 0 %
Basophils Absolute: 0 10*3/uL (ref 0.0–0.1)
Eosinophils Absolute: 0.1 10*3/uL (ref 0.0–0.7)
Eosinophils Relative: 1 %
HEMATOCRIT: 31 % — AB (ref 36.0–46.0)
Hemoglobin: 10.4 g/dL — ABNORMAL LOW (ref 12.0–15.0)
Lymphocytes Relative: 21 %
Lymphs Abs: 2 10*3/uL (ref 0.7–4.0)
MCH: 28.7 pg (ref 26.0–34.0)
MCHC: 33.5 g/dL (ref 30.0–36.0)
MCV: 85.6 fL (ref 78.0–100.0)
MONO ABS: 0.3 10*3/uL (ref 0.1–1.0)
MONOS PCT: 3 %
NEUTROS PCT: 75 %
Neutro Abs: 7.2 10*3/uL (ref 1.7–7.7)
Platelets: 257 10*3/uL (ref 150–400)
RBC: 3.62 MIL/uL — ABNORMAL LOW (ref 3.87–5.11)
RDW: 13.3 % (ref 11.5–15.5)
WBC: 9.5 10*3/uL (ref 4.0–10.5)

## 2015-05-11 LAB — BASIC METABOLIC PANEL
ANION GAP: 14 (ref 5–15)
BUN: 36 mg/dL — ABNORMAL HIGH (ref 6–20)
CHLORIDE: 99 mmol/L — AB (ref 101–111)
CO2: 21 mmol/L — AB (ref 22–32)
CREATININE: 1.83 mg/dL — AB (ref 0.44–1.00)
Calcium: 9.1 mg/dL (ref 8.9–10.3)
GFR calc non Af Amer: 36 mL/min — ABNORMAL LOW (ref >60)
GFR, EST AFRICAN AMERICAN: 41 mL/min — AB (ref >60)
GLUCOSE: 495 mg/dL — AB (ref 65–99)
Potassium: 4.4 mmol/L (ref 3.5–5.1)
Sodium: 134 mmol/L — ABNORMAL LOW (ref 135–145)

## 2015-05-11 LAB — URINE MICROSCOPIC-ADD ON: BACTERIA UA: NONE SEEN

## 2015-05-11 LAB — MAGNESIUM: Magnesium: 1.5 mg/dL — ABNORMAL LOW (ref 1.7–2.4)

## 2015-05-11 MED ORDER — DIAZEPAM 5 MG PO TABS: 5.0000 mg | ORAL_TABLET | Freq: Four times a day (QID) | ORAL | Status: DC | PRN

## 2015-05-11 MED ORDER — INSULIN ASPART 100 UNIT/ML IV SOLN: 10.0000 [IU] | Freq: Once | INTRAVENOUS | Status: DC

## 2015-05-11 MED ORDER — SODIUM CHLORIDE 0.9 % IV BOLUS (SEPSIS)
500.0000 mL | Freq: Once | INTRAVENOUS | Status: AC
  Administered 2015-05-11: 500 mL via INTRAVENOUS

## 2015-05-11 NOTE — ED Notes (Signed)
Pt took own BS prior to D/C was 155.

## 2015-05-11 NOTE — ED Notes (Signed)
Pt wanting to leave.  Dr. Reather Converse in to speak with pt.

## 2015-05-11 NOTE — Discharge Instructions (Signed)
Follow-up very closely with your primary doctor for improved diabetic control. Follow-up for medicine level results that  Do not come back immediately. Stay well hydrated with water and for muscle spasms he can try Valium as needed. It can make you sleepy.  If you were given medicines take as directed.  If you are on coumadin or contraceptives realize their levels and effectiveness is altered by many different medicines.  If you have any reaction (rash, tongues swelling, other) to the medicines stop taking and see a physician.    If your blood pressure was elevated in the ER make sure you follow up for management with a primary doctor or return for chest pain, shortness of breath or stroke symptoms.  Please follow up as directed and return to the ER or see a physician for new or worsening symptoms.  Thank you.

## 2015-05-11 NOTE — ED Notes (Signed)
Pt being treated for UTI for approx 1 week-- pt is a kidney transplant-- 2007. Pt had an episode of right arm shaking this am-- had an episode of same in 2016 with admission due to glucose being >1000. Pt also twisted right ankle at work 1 week ago-- ankle is slightly swollen.

## 2015-05-11 NOTE — ED Provider Notes (Signed)
CSN: [id number]     Arrival date & time 05/11/15  [id number] History   First MD Initiated Contact with Patient 05/11/15 [phone]     Chief Complaint  Patient presents with  . Spasms  . Ankle Injury     (Consider location/radiation/quality/duration/timing/severity/associated sxs/prior Treatment) HPI Comments: 30 year old female with history of obesity, high blood pressure, chronic kidney disease, current kidney transplant followed closely outpatient and compliant with immunosuppressants, diabetes and admission for hyperglycemia in the past, currently being treated with Keflex for urinary infection for approximately 1 week presents with right arm muscle spasm and tightening the last approximately 10 minutes prior to arrival. Patient had more significant episode 2 years back and was admitted for electrolyte issues and hyperglycemia. This is a mild version of this however patient wanted to come in before it got worse. Patient's glucose has been elevated 300 400. Patient currently has no muscle spasms. No abdominal pain except for mild suprapubic associated with urination. Transplant team is aware of urine infection Place patient on Keflex. Currently minimal symptoms. Unrelated patient twisted her right ankle week ago and has mild swelling and tenderness with walking. Aside from Keflex patient denies any other significant change in medicines, patients on prednisone and immunosuppressants, patient is on insulin syringes.  The history is provided by the patient.    Past Medical History  Diagnosis Date  . Chronic kidney disease   . Hypertension   . Hyperlipidemia   . Diabetes mellitus     Pt reports diagnosis in June 2011  . UTI (urinary tract infection) 01/09/2015  . Kidney transplant recipient    Past Surgical History  Procedure Laterality Date  . Kidney transplant  2007  . Thyroid lobectomy    . Renal biopsy Bilateral 2003   Family History  Problem Relation Age of Onset  . Arthritis Mother   .  Diabetes Mother   . Hypertension Mother    Social History  Substance Use Topics  . Smoking status: Never Smoker   . Smokeless tobacco: Never Used  . Alcohol Use: No   OB History    No data available     Review of Systems  Constitutional: Negative for fever and chills.  HENT: Negative for congestion.   Eyes: Negative for visual disturbance.  Respiratory: Negative for shortness of breath.   Cardiovascular: Negative for chest pain.  Gastrointestinal: Negative for vomiting and abdominal pain.  Genitourinary: Positive for dysuria. Negative for flank pain.  Musculoskeletal: Negative for back pain, neck pain and neck stiffness.  Skin: Negative for rash.  Neurological: Negative for light-headedness and headaches.      Allergies  Motrin; Banana; Diphenhydramine hcl; Iron dextran; and Shellfish allergy  Home Medications   Prior to Admission medications   Medication Sig Start Date End Date Taking? Authorizing Provider  acetaminophen (TYLENOL) 500 MG tablet Take 1,000 mg by mouth every 6 (six) hours as needed for mild pain.   Yes Historical Provider, MD  calcium carbonate (TUMS) 500 MG chewable tablet Chew 2 tablets by mouth 3 (three) times a week. Takes on Monday, wednesdays, and fridays   Yes Historical Provider, MD  famotidine (PEPCID) 20 MG tablet Take 1 tablet (20 mg total) by mouth daily. Patient taking differently: Take 20 mg by mouth daily. For heartburn and acid reflux 01/12/15  Yes Nita Sells, MD  glucose blood (BAYER CONTOUR NEXT TEST) test strip Check 8 times per day, and lancets 11/28/14  Yes Renato Shin, MD  hydrOXYzine (ATARAX) 25 MG tablet Take 25  mg by mouth 2 (two) times daily as needed for itching.    Yes Historical Provider, MD  Insulin Degludec (TRESIBA FLEXTOUCH) 100 UNIT/ML SOPN Inject 55 Units into the skin daily. And pen needles 1/day 04/28/15  Yes Renato Shin, MD  Insulin Syringe-Needle U-100 (B-D INSULIN SYRINGE 1CC/25G) 25G X 5/8" 1 ML MISC Use to  inject insulin 1 time per day 02/17/15  Yes Renato Shin, MD  lisinopril-hydrochlorothiazide (PRINZIDE,ZESTORETIC) 20-25 MG tablet Take 1 tablet by mouth daily as needed (blood pressure).  11/14/14  Yes Historical Provider, MD  mycophenolate (MYFORTIC) 360 MG TBEC Take 720 mg by mouth 2 (two) times daily.    Yes Historical Provider, MD  NOVOLOG 100 UNIT/ML injection 80 Units by Pump Prime route daily as needed. 04/06/15  Yes Historical Provider, MD  pantoprazole (PROTONIX) 20 MG tablet Take 1 tablet (20 mg total) by mouth daily. Patient taking differently: Take 20 mg by mouth daily. For heartburn and reflux 01/12/15  Yes Nita Sells, MD  predniSONE (DELTASONE) 5 MG tablet Take 5 mg by mouth at bedtime.    Yes Historical Provider, MD  promethazine (PHENERGAN) 12.5 MG tablet Take 1 tablet (12.5 mg total) by mouth every 6 (six) hours as needed for nausea or vomiting. 10/01/14  Yes Ripudeep Krystal Eaton, MD  simvastatin (ZOCOR) 20 MG tablet Take 20 mg by mouth every evening.   Yes Historical Provider, MD  tacrolimus (PROGRAF) 1 MG capsule Take 2 mg by mouth 2 (two) times daily.    Yes Historical Provider, MD  diazepam (VALIUM) 5 MG tablet Take 1 tablet (5 mg total) by mouth every 6 (six) hours as needed for anxiety (spasms). 05/11/15   Elnora Morrison, MD   BP 152/125 mmHg  Pulse 100  Temp(Src) 98 F (36.7 C) (Oral)  Resp 20  Ht 5\' 6"  (1.676 m)  Wt 162 lb (73.483 kg)  BMI 26.16 kg/m2  SpO2 100% Physical Exam  Constitutional: She is oriented to person, place, and time. She appears well-developed and well-nourished.  HENT:  Head: Normocephalic and atraumatic.  Mild dry mucous membranes  Eyes: Conjunctivae are normal. Right eye exhibits no discharge. Left eye exhibits no discharge.  Neck: Normal range of motion. Neck supple. No tracheal deviation present.  Cardiovascular: Normal rate and regular rhythm.   Pulmonary/Chest: Effort normal and breath sounds normal.  Abdominal: Soft. She exhibits no  distension. There is no tenderness. There is no guarding.  Musculoskeletal: She exhibits edema (Mild tenderness right anterior ankle, mild effusion, no sign of infection, pain with dorsiflexion) and tenderness.  Patient has normal muscle tone in all extremities no spasms or joint effusions.  Neurological: She is alert and oriented to person, place, and time.  Skin: Skin is warm. No rash noted.  Psychiatric: She has a normal mood and affect.  Nursing note and vitals reviewed.   ED Course  Procedures (including critical care time) Labs Review Labs Reviewed  CBC WITH DIFFERENTIAL/PLATELET - Abnormal; Notable for the following:    RBC 3.62 (*)    Hemoglobin 10.4 (*)    HCT 31.0 (*)    All other components within normal limits  BASIC METABOLIC PANEL - Abnormal; Notable for the following:    Sodium 134 (*)    Chloride 99 (*)    CO2 21 (*)    Glucose, Bld 495 (*)    BUN 36 (*)    Creatinine, Ser 1.83 (*)    GFR calc non Af Amer 36 (*)    GFR  calc Af Amer 41 (*)    All other components within normal limits  URINALYSIS, ROUTINE W REFLEX MICROSCOPIC (NOT AT [facility]) - Abnormal; Notable for the following:    Color, Urine STRAW (*)    Glucose, UA >1000 (*)    All other components within normal limits  MAGNESIUM - Abnormal; Notable for the following:    Magnesium 1.5 (*)    All other components within normal limits  URINE MICROSCOPIC-ADD ON - Abnormal; Notable for the following:    Squamous Epithelial / LPF 0-5 (*)    All other components within normal limits  URINE CULTURE  TACROLIMUS LEVEL    Imaging Review Dg Ankle Complete Right  05/11/2015  CLINICAL DATA:  Right ankle pain, twisting injury at work. EXAM: RIGHT ANKLE - COMPLETE 3+ VIEW COMPARISON:  None. FINDINGS: There is no evidence of fracture, dislocation, or joint effusion. There is no evidence of arthropathy or other focal bone abnormality. Soft tissues are unremarkable. IMPRESSION: Negative. Electronically Signed   By: Rolm Baptise M.D.   On: 05/11/2015 08:42   I have personally reviewed and evaluated these images and lab results as part of my medical decision-making.   EKG Interpretation None      MDM   Final diagnoses:  Hyperglycemia  Muscle spasm  Dehydration   Patient presents after one episode of right arm muscle spasms, neurologically intact currently and minimal symptoms currently. With transplant history and uncontrolled diabetes point for blood work, IV fluid bolus urinalysis to look for persistent signs of infection. Patient requesting to be discharged after workup however with medical history I explained we need to be more vigilant.  Delay in blood work and also delaying giving insulin. Patient frustrated and requesting to be discharged. Patient does not want to come the hospital. Patient wants to improve her sugars at home with her home insulin and follow-up with primary doctor. Valium when necessary for spasms.  Results and differential diagnosis were discussed with the patient/parent/guardian. Xrays were independently reviewed by myself.  Close follow up outpatient was discussed, comfortable with the plan.   Medications  insulin aspart (novoLOG) injection 10 Units (not administered)  sodium chloride 0.9 % bolus 500 mL (500 mLs Intravenous New Bag/Given 05/11/15 0928)    Filed Vitals:   05/11/15 0821 05/11/15 1138  BP: 139/101 152/125  Pulse: 91 100  Temp: 98 F (36.7 C)   TempSrc: Oral   Resp: 16 20  Height: 5\' 6"  (1.676 m)   Weight: 162 lb (73.483 kg)   SpO2: 98% 100%    Final diagnoses:  Hyperglycemia  Muscle spasm  Dehydration      Elnora Morrison, MD 05/11/15 1153

## 2015-05-12 LAB — URINE CULTURE: Culture: 4000

## 2015-05-12 LAB — TACROLIMUS LEVEL: TACROLIMUS (FK506) - LABCORP: 3.9 ng/mL (ref 2.0–20.0)

## 2015-05-15 ENCOUNTER — Encounter (HOSPITAL_COMMUNITY)
Admission: RE | Admit: 2015-05-15 | Discharge: 2015-05-15 | Disposition: A | Discharge status: Home / Self Care | Payer: Medicare Other | Source: Ambulatory Visit | Attending: Nephrology | Admitting: Nephrology

## 2015-05-15 DIAGNOSIS — D631 Anemia in chronic kidney disease: Secondary | ICD-10-CM | POA: Diagnosis not present

## 2015-05-15 DIAGNOSIS — N183 Chronic kidney disease, stage 3 (moderate): Secondary | ICD-10-CM | POA: Insufficient documentation

## 2015-05-15 LAB — POCT HEMOGLOBIN-HEMACUE: HEMOGLOBIN: 11.1 g/dL — AB (ref 12.0–15.0)

## 2015-05-15 MED ORDER — EPOETIN ALFA 10000 UNIT/ML IJ SOLN
INTRAMUSCULAR | Status: AC
  Administered 2015-05-15: 10000 [IU] via SUBCUTANEOUS
  Filled 2015-05-15: qty 1

## 2015-05-15 MED ORDER — EPOETIN ALFA 40000 UNIT/ML IJ SOLN: 30000.0000 [IU] | INTRAMUSCULAR | Status: DC

## 2015-05-15 MED ORDER — EPOETIN ALFA 20000 UNIT/ML IJ SOLN
INTRAMUSCULAR | Status: AC
  Administered 2015-05-15: 20000 [IU] via SUBCUTANEOUS
  Filled 2015-05-15: qty 1

## 2015-05-25 DIAGNOSIS — IMO0002 Reserved for concepts with insufficient information to code with codable children: Secondary | ICD-10-CM | POA: Insufficient documentation

## 2015-05-25 DIAGNOSIS — E109 Type 1 diabetes mellitus without complications: Secondary | ICD-10-CM | POA: Insufficient documentation

## 2015-05-25 DIAGNOSIS — E108 Type 1 diabetes mellitus with unspecified complications: Secondary | ICD-10-CM

## 2015-05-25 DIAGNOSIS — E1065 Type 1 diabetes mellitus with hyperglycemia: Secondary | ICD-10-CM | POA: Insufficient documentation

## 2015-05-25 NOTE — Patient Instructions (Signed)
Please increase the tresiba to 55 units daily.  Please come back for a follow-up appointment in 1 month.  In view of your medical condition, you should avoid pregnancy until we have decided it is safe.   check your blood sugar 4 times a day: before the 3 meals, and at bedtime.  also check if you have symptoms of your blood sugar being too high or too low.  please keep a record of the readings and bring it to your next appointment here.  You can write it on any piece of paper.  please call us sooner if your blood sugar goes below 70, or if you have a lot of readings over 200.

## 2015-05-25 NOTE — Progress Notes (Signed)
   Subjective:    Patient ID: [name], female    DOB: 04-Sep-1985, 30 y.o.   MRN: [id number]  HPI Pt returns for f/u of diabetes mellitus: DM type: 1 Dx'ed: 2012, when she was on prednisone Complications: polyneuropathy and renal failure (transplant in 2007) Therapy: insulin since soon after dx GDM: (G0) DKA: several episodes, most recently in [place]. Severe hypoglycemia: never Pancreatitis: never Other: she took pump rx during 2016, but was changed to qd insulin, due to poor results; she is chronically on prednisone; pt says she is at risk for pregnancy. Interval history: Meter is downloaded today, and the printout is scanned into the record.  CBG's again vary widely.  There is no trend throughout the day.  Pt says she never misses the tresiba.  Her prednisone was increased, but it has been back to 5 mg/d, for more than 1 month.  pt states she feels well in general.   Review of Systems     Objective:   Physical Exam        Assessment & Plan:   This encounter was created in error - please disregard.

## 2015-05-26 ENCOUNTER — Encounter: Payer: Medicare Other | Admitting: Endocrinology

## 2015-05-26 ENCOUNTER — Encounter: Payer: Medicare Other | Admitting: Nutrition

## 2015-05-26 DIAGNOSIS — Z940 Kidney transplant status: Secondary | ICD-10-CM | POA: Diagnosis not present

## 2015-05-26 DIAGNOSIS — E1121 Type 2 diabetes mellitus with diabetic nephropathy: Secondary | ICD-10-CM | POA: Diagnosis not present

## 2015-05-26 DIAGNOSIS — I10 Essential (primary) hypertension: Secondary | ICD-10-CM | POA: Diagnosis not present

## 2015-05-26 DIAGNOSIS — J302 Other seasonal allergic rhinitis: Secondary | ICD-10-CM | POA: Diagnosis not present

## 2015-05-26 DIAGNOSIS — Z1389 Encounter for screening for other disorder: Secondary | ICD-10-CM | POA: Diagnosis not present

## 2015-05-26 DIAGNOSIS — R0600 Dyspnea, unspecified: Secondary | ICD-10-CM | POA: Diagnosis not present

## 2015-05-29 ENCOUNTER — Encounter (HOSPITAL_COMMUNITY)
Admission: RE | Admit: 2015-05-29 | Discharge: 2015-05-29 | Disposition: A | Discharge status: Home / Self Care | Payer: Medicare Other | Source: Ambulatory Visit | Attending: Nephrology | Admitting: Nephrology

## 2015-05-29 DIAGNOSIS — D631 Anemia in chronic kidney disease: Secondary | ICD-10-CM | POA: Diagnosis not present

## 2015-05-29 DIAGNOSIS — N183 Chronic kidney disease, stage 3 (moderate): Secondary | ICD-10-CM | POA: Diagnosis not present

## 2015-05-29 LAB — IRON AND TIBC
IRON: 71 ug/dL (ref 28–170)
Saturation Ratios: 31 % (ref 10.4–31.8)
TIBC: 230 ug/dL — ABNORMAL LOW (ref 250–450)
UIBC: 159 ug/dL

## 2015-05-29 LAB — FERRITIN: FERRITIN: 1326 ng/mL — AB (ref 11–307)

## 2015-05-29 LAB — POCT HEMOGLOBIN-HEMACUE: HEMOGLOBIN: 11.7 g/dL — AB (ref 12.0–15.0)

## 2015-05-29 MED ORDER — EPOETIN ALFA 10000 UNIT/ML IJ SOLN
INTRAMUSCULAR | Status: AC
  Filled 2015-05-29: qty 1

## 2015-05-29 MED ORDER — EPOETIN ALFA 20000 UNIT/ML IJ SOLN
INTRAMUSCULAR | Status: AC
  Filled 2015-05-29: qty 1

## 2015-05-29 MED ORDER — EPOETIN ALFA 40000 UNIT/ML IJ SOLN: 30000.0000 [IU] | INTRAMUSCULAR | Status: DC

## 2015-06-09 DIAGNOSIS — E139 Other specified diabetes mellitus without complications: Secondary | ICD-10-CM | POA: Diagnosis not present

## 2015-06-09 DIAGNOSIS — N12 Tubulo-interstitial nephritis, not specified as acute or chronic: Secondary | ICD-10-CM | POA: Diagnosis not present

## 2015-06-09 DIAGNOSIS — K219 Gastro-esophageal reflux disease without esophagitis: Secondary | ICD-10-CM | POA: Diagnosis not present

## 2015-06-09 DIAGNOSIS — L299 Pruritus, unspecified: Secondary | ICD-10-CM | POA: Diagnosis not present

## 2015-06-09 DIAGNOSIS — D649 Anemia, unspecified: Secondary | ICD-10-CM | POA: Diagnosis not present

## 2015-06-09 DIAGNOSIS — N390 Urinary tract infection, site not specified: Secondary | ICD-10-CM | POA: Diagnosis not present

## 2015-06-09 DIAGNOSIS — E785 Hyperlipidemia, unspecified: Secondary | ICD-10-CM | POA: Diagnosis not present

## 2015-06-09 DIAGNOSIS — I10 Essential (primary) hypertension: Secondary | ICD-10-CM | POA: Diagnosis not present

## 2015-06-09 DIAGNOSIS — Z940 Kidney transplant status: Secondary | ICD-10-CM | POA: Diagnosis not present

## 2015-06-10 ENCOUNTER — Encounter: Payer: Medicare Other | Admitting: Nutrition

## 2015-06-10 ENCOUNTER — Encounter: Payer: Self-pay | Admitting: Endocrinology

## 2015-06-10 ENCOUNTER — Telehealth: Payer: Self-pay | Admitting: Endocrinology

## 2015-06-10 ENCOUNTER — Ambulatory Visit (INDEPENDENT_AMBULATORY_CARE_PROVIDER_SITE_OTHER): Payer: Medicare Other | Admitting: Endocrinology

## 2015-06-10 VITALS — BP 132/86 | HR 91 | Temp 97.7°F | Wt 176.0 lb

## 2015-06-10 DIAGNOSIS — N185 Chronic kidney disease, stage 5: Secondary | ICD-10-CM | POA: Diagnosis not present

## 2015-06-10 DIAGNOSIS — Z940 Kidney transplant status: Secondary | ICD-10-CM | POA: Diagnosis not present

## 2015-06-10 DIAGNOSIS — N12 Tubulo-interstitial nephritis, not specified as acute or chronic: Secondary | ICD-10-CM | POA: Diagnosis not present

## 2015-06-10 DIAGNOSIS — D649 Anemia, unspecified: Secondary | ICD-10-CM | POA: Diagnosis not present

## 2015-06-10 DIAGNOSIS — E1022 Type 1 diabetes mellitus with diabetic chronic kidney disease: Secondary | ICD-10-CM | POA: Diagnosis not present

## 2015-06-10 LAB — POCT GLYCOSYLATED HEMOGLOBIN (HGB A1C): HEMOGLOBIN A1C: 13.1

## 2015-06-10 MED ORDER — GLUCOSE BLOOD VI STRP: ORAL_STRIP | Status: DC

## 2015-06-10 MED ORDER — INSULIN DEGLUDEC 100 UNIT/ML ~~LOC~~ SOPN: 70.0000 [IU] | PEN_INJECTOR | Freq: Every day | SUBCUTANEOUS | Status: DC

## 2015-06-10 NOTE — Patient Instructions (Addendum)
Please increase the tresiba to 70 units daily.   Please stop using the insulin pump.   Please call us in a few days, to tell us how the blood sugar is doing.  Please come back for a follow-up appointment in 1 month.  check your blood sugar 4 times a day: before the 3 meals, and at bedtime.  also check if you have symptoms of your blood sugar being too high or too low.  please keep a record of the readings and bring it to your next appointment here.  You can write it on any piece of paper.  please call us sooner if your blood sugar goes below 70, or if you have a lot of readings over 200.

## 2015-06-10 NOTE — Telephone Encounter (Signed)
Patient stated that pharmacist stated she needed a CMN form before she can get her test strips,  Pharmacy faxed over form for Dr Loanne Drilling to fill out.

## 2015-06-10 NOTE — Progress Notes (Signed)
Subjective:    Patient ID: [name], female    DOB: 1985/03/19, 30 y.o.   MRN: [id number]  HPI Pt returns for f/u of diabetes mellitus: DM type: 1 Dx'ed: 2012, when she was on prednisone Complications: polyneuropathy and renal failure (transplant in 2007).   Therapy: insulin since soon after dx GDM: (G0) DKA: several episodes, most recently in mid-2016.  Severe hypoglycemia: never Pancreatitis: never Other: she took pump rx during 2016, but was changed to qd insulin, due to poor results; she is chronically on prednisone; pt has had TAH (at prior ov's here, she has reported being at risk for pregnancy, even after TAH).   Interval history: no cbg record, but states cbg's vary widely.  Pt says she never misses the tresiba.  pt states she feels well in general.  She says her diet is poor.  Pt says she has mild hypoglycemia approx 3 times per week (usually at hs).  Pt says she intermittently uses the insulin pump, but does not know how often or how much.   Past Medical History  Diagnosis Date  . Chronic kidney disease   . Hypertension   . Hyperlipidemia   . Diabetes mellitus     Pt reports diagnosis in June 2011  . UTI (urinary tract infection) 01/09/2015  . Kidney transplant recipient     Past Surgical History  Procedure Laterality Date  . Kidney transplant  2007  . Thyroid lobectomy    . Renal biopsy Bilateral 2003    Social History   Social History  . Marital Status: Single    Spouse Name: N/A  . Number of Children: N/A  . Years of Education: N/A   Occupational History  . Not on file.   Social History Main Topics  . Smoking status: Never Smoker   . Smokeless tobacco: Never Used  . Alcohol Use: No  . Drug Use: No  . Sexual Activity: Yes    Birth Control/ Protection: None   Other Topics Concern  . Not on file   Social History Narrative    Current Outpatient Prescriptions on File Prior to Visit  Medication Sig Dispense Refill  . acetaminophen (TYLENOL)  500 MG tablet Take 1,000 mg by mouth every 6 (six) hours as needed for mild pain.    . calcium carbonate (TUMS) 500 MG chewable tablet Chew 2 tablets by mouth 3 (three) times a week. Takes on Monday, wednesdays, and fridays    . diazepam (VALIUM) 5 MG tablet Take 1 tablet (5 mg total) by mouth every 6 (six) hours as needed for anxiety (spasms). 6 tablet 0  . famotidine (PEPCID) 20 MG tablet Take 1 tablet (20 mg total) by mouth daily. (Patient taking differently: Take 20 mg by mouth daily. For heartburn and acid reflux) 30 tablet 0  . hydrOXYzine (ATARAX) 25 MG tablet Take 25 mg by mouth 2 (two) times daily as needed for itching.     Marland Kitchen lisinopril-hydrochlorothiazide (PRINZIDE,ZESTORETIC) 20-25 MG tablet Take 1 tablet by mouth daily as needed (blood pressure).   5  . mycophenolate (MYFORTIC) 360 MG TBEC Take 720 mg by mouth 2 (two) times daily.     . pantoprazole (PROTONIX) 20 MG tablet Take 1 tablet (20 mg total) by mouth daily. (Patient taking differently: Take 20 mg by mouth daily. For heartburn and reflux) 30 tablet 0  . predniSONE (DELTASONE) 5 MG tablet Take 5 mg by mouth at bedtime.     . promethazine (PHENERGAN) 12.5 MG tablet  Take 1 tablet (12.5 mg total) by mouth every 6 (six) hours as needed for nausea or vomiting. 30 tablet 0  . simvastatin (ZOCOR) 20 MG tablet Take 20 mg by mouth every evening.    . tacrolimus (PROGRAF) 1 MG capsule Take 2 mg by mouth 2 (two) times daily.      No current facility-administered medications on file prior to visit.    Allergies  Allergen Reactions  . Motrin [Ibuprofen] Shortness Of Breath and Itching    Per pt  . Banana Other (See Comments)    Sick on the stomach  . Diphenhydramine Hcl     REACTION: Stopped breathing in ICU  . Iron Dextran     REACTION: vein irritation  . Shellfish Allergy Hives    Family History  Problem Relation Age of Onset  . Arthritis Mother   . Diabetes Mother   . Hypertension Mother     BP 132/86 mmHg  Pulse 91   Temp(Src) 97.7 F (36.5 C) (Oral)  Wt 176 lb (79.833 kg)  SpO2 99%  Review of Systems She denies LOC.     Objective:   Physical Exam VITAL SIGNS:  See vs page GENERAL: no distress Pulses: dorsalis pedis intact bilat.   MSK: no deformity of the feet CV: no leg edema Skin:  There is a 1 cm healed ulcer at the lateral aspect of the left great toe.  normal color and temp on the feet. Neuro: sensation is intact to touch on the feet, but decreased from normal.    A1c=13.1%.    Assessment & Plan:  DM: worse Noncompliance with cbg recording and insulin dosing, persistent and severe: she needs the simplest possible regimen, and achievable goals.  Patient is advised the following: Patient Instructions  Please increase the tresiba to 70 units daily.   Please stop using the insulin pump.   Please call us in a few days, to tell us how the blood sugar is doing.  Please come back for a follow-up appointment in 1 month.  check your blood sugar 4 times a day: before the 3 meals, and at bedtime.  also check if you have symptoms of your blood sugar being too high or too low.  please keep a record of the readings and bring it to your next appointment here.  You can write it on any piece of paper.  please call us sooner if your blood sugar goes below 70, or if you have a lot of readings over 200.

## 2015-06-10 NOTE — Telephone Encounter (Signed)
Noted  

## 2015-06-11 ENCOUNTER — Telehealth: Payer: Self-pay | Admitting: Endocrinology

## 2015-06-11 NOTE — Telephone Encounter (Signed)
CMN was completed earlier this morning and stated the pt is checking her blood sugars 4 times per day. 4 times per day was listed on the initial form. CMN re-faxed to [facility]. Pt does not use [facility] at this current time.

## 2015-06-11 NOTE — Telephone Encounter (Signed)
Ben calling from [place] stated the patient's Cmn form test frequency is 4 on the prescription, Dr Loanne Drilling put 8, he asks if Dr Loanne Drilling could cross out the 8 and put 4 and initial it. Please fax # [phone]

## 2015-06-12 ENCOUNTER — Encounter (HOSPITAL_COMMUNITY)
Admission: RE | Admit: 2015-06-12 | Discharge: 2015-06-12 | Disposition: A | Discharge status: Home / Self Care | Payer: Medicare Other | Source: Ambulatory Visit | Attending: Nephrology | Admitting: Nephrology

## 2015-06-12 DIAGNOSIS — D631 Anemia in chronic kidney disease: Secondary | ICD-10-CM | POA: Diagnosis not present

## 2015-06-12 DIAGNOSIS — N183 Chronic kidney disease, stage 3 (moderate): Secondary | ICD-10-CM | POA: Diagnosis not present

## 2015-06-12 LAB — POCT HEMOGLOBIN-HEMACUE: Hemoglobin: 11.6 g/dL — ABNORMAL LOW (ref 12.0–15.0)

## 2015-06-12 MED ORDER — EPOETIN ALFA 10000 UNIT/ML IJ SOLN
INTRAMUSCULAR | Status: AC
  Administered 2015-06-12: 10000 [IU] via SUBCUTANEOUS
  Filled 2015-06-12: qty 1

## 2015-06-12 MED ORDER — EPOETIN ALFA 40000 UNIT/ML IJ SOLN: 30000.0000 [IU] | INTRAMUSCULAR | Status: DC

## 2015-06-12 MED ORDER — EPOETIN ALFA 20000 UNIT/ML IJ SOLN
INTRAMUSCULAR | Status: AC
  Administered 2015-06-12: 20000 [IU] via SUBCUTANEOUS
  Filled 2015-06-12: qty 1

## 2015-06-13 ENCOUNTER — Telehealth: Payer: Self-pay | Admitting: Endocrinology

## 2015-06-13 ENCOUNTER — Other Ambulatory Visit: Payer: Self-pay

## 2015-06-13 MED ORDER — GLUCOSE BLOOD VI STRP: ORAL_STRIP | Status: DC

## 2015-06-13 NOTE — Telephone Encounter (Signed)
See note below and please advise about the blood sugar. I contacted the pharmacy and they will dispense the test strips. They received the CMN form.

## 2015-06-13 NOTE — Telephone Encounter (Signed)
[facility] called about the Rx for the test strips and needs verification Phone: [phone] Fax: [phone]

## 2015-06-13 NOTE — Telephone Encounter (Signed)
Left a vm advising of note below. Requested a call back if the pt would like to discuss.  

## 2015-06-13 NOTE — Telephone Encounter (Signed)
Please increase the tresiba to 80 units daily.  Please call next week, to report cbg's

## 2015-06-13 NOTE — Telephone Encounter (Signed)
PT said that she is almost out of strips and her pharmacy sent [place] something for a PA because her insurance won't cover it. Also her Sugar Readings are 3/29: AM 289, PM 311 3/30: AM 250, Noon 139, PM 239 This Morning: 227  Pt requests call back

## 2015-06-13 NOTE — Telephone Encounter (Signed)
Pharmacy needed to clarify the testing frequency. Based off last office note from 06/10/2015 pt is checking 4 times per day. Pharmacy requested a new rx for the test strips stating she is checking 4 times per day. Rx faxed.

## 2015-06-26 ENCOUNTER — Encounter (HOSPITAL_COMMUNITY)
Admission: RE | Admit: 2015-06-26 | Discharge: 2015-06-26 | Disposition: A | Discharge status: Home / Self Care | Payer: Medicare Other | Source: Ambulatory Visit | Attending: Nephrology | Admitting: Nephrology

## 2015-06-26 DIAGNOSIS — N183 Chronic kidney disease, stage 3 (moderate): Secondary | ICD-10-CM | POA: Insufficient documentation

## 2015-06-26 DIAGNOSIS — D631 Anemia in chronic kidney disease: Secondary | ICD-10-CM | POA: Insufficient documentation

## 2015-06-26 LAB — IRON AND TIBC
IRON: 75 ug/dL (ref 28–170)
SATURATION RATIOS: 31 % (ref 10.4–31.8)
TIBC: 245 ug/dL — AB (ref 250–450)
UIBC: 170 ug/dL

## 2015-06-26 LAB — FERRITIN: Ferritin: 1313 ng/mL — ABNORMAL HIGH (ref 11–307)

## 2015-06-26 MED ORDER — EPOETIN ALFA 10000 UNIT/ML IJ SOLN
INTRAMUSCULAR | Status: AC
  Administered 2015-06-26: 10000 [IU]
  Filled 2015-06-26: qty 1

## 2015-06-26 MED ORDER — EPOETIN ALFA 20000 UNIT/ML IJ SOLN
INTRAMUSCULAR | Status: AC
  Administered 2015-06-26: 20000 [IU]
  Filled 2015-06-26: qty 1

## 2015-06-26 MED ORDER — EPOETIN ALFA 40000 UNIT/ML IJ SOLN: 30000.0000 [IU] | INTRAMUSCULAR | Status: DC

## 2015-06-27 LAB — POCT HEMOGLOBIN-HEMACUE: Hemoglobin: 9.2 g/dL — ABNORMAL LOW (ref 12.0–15.0)

## 2015-07-02 DIAGNOSIS — Z79899 Other long term (current) drug therapy: Secondary | ICD-10-CM | POA: Diagnosis not present

## 2015-07-02 DIAGNOSIS — Z940 Kidney transplant status: Secondary | ICD-10-CM | POA: Diagnosis not present

## 2015-07-02 DIAGNOSIS — E139 Other specified diabetes mellitus without complications: Secondary | ICD-10-CM | POA: Diagnosis not present

## 2015-07-10 ENCOUNTER — Ambulatory Visit (INDEPENDENT_AMBULATORY_CARE_PROVIDER_SITE_OTHER): Payer: Medicare Other | Admitting: Endocrinology

## 2015-07-10 ENCOUNTER — Encounter: Payer: Self-pay | Admitting: Endocrinology

## 2015-07-10 ENCOUNTER — Encounter (HOSPITAL_COMMUNITY)
Admission: RE | Admit: 2015-07-10 | Discharge: 2015-07-10 | Disposition: A | Discharge status: Home / Self Care | Payer: Medicare Other | Source: Ambulatory Visit | Attending: Nephrology | Admitting: Nephrology

## 2015-07-10 VITALS — BP 122/86 | HR 103 | Ht 66.0 in | Wt 176.0 lb

## 2015-07-10 DIAGNOSIS — N185 Chronic kidney disease, stage 5: Secondary | ICD-10-CM

## 2015-07-10 DIAGNOSIS — N183 Chronic kidney disease, stage 3 (moderate): Secondary | ICD-10-CM | POA: Diagnosis not present

## 2015-07-10 DIAGNOSIS — E1022 Type 1 diabetes mellitus with diabetic chronic kidney disease: Secondary | ICD-10-CM | POA: Diagnosis not present

## 2015-07-10 DIAGNOSIS — D631 Anemia in chronic kidney disease: Secondary | ICD-10-CM | POA: Diagnosis not present

## 2015-07-10 LAB — POCT HEMOGLOBIN-HEMACUE: HEMOGLOBIN: 12.1 g/dL (ref 12.0–15.0)

## 2015-07-10 MED ORDER — EPOETIN ALFA 40000 UNIT/ML IJ SOLN: 30000.0000 [IU] | INTRAMUSCULAR | Status: DC

## 2015-07-10 MED ORDER — INSULIN DEGLUDEC 100 UNIT/ML ~~LOC~~ SOPN: 90.0000 [IU] | PEN_INJECTOR | Freq: Every day | SUBCUTANEOUS | Status: DC

## 2015-07-10 NOTE — Progress Notes (Signed)
Subjective:    Patient ID: [name], female    DOB: 08-Aug-1985, 30 y.o.   MRN: [id number]  HPI Pt returns for f/u of diabetes mellitus: DM type: 1 Dx'ed: 2012, when she was on prednisone Complications: polyneuropathy and renal failure (transplant in 2007).   Therapy: insulin since soon after dx.   GDM: (G0).   DKA: several episodes, most recently in mid-2016.  Severe hypoglycemia: never.   Pancreatitis: never.  Other: she took pump rx during 2016, but was changed to qd insulin, due to poor results; she is chronically on prednisone; pt has had TAH (at prior ov's here, she has reported being at risk for pregnancy, even after TAH).   Interval history: She takes tresiba 60-80 units qd.  she brings a record of her cbg's which i have reviewed today.  she brings a record of her cbg's which i have reviewed today.  It varies from 180-400.  There is no trend throughout the day.  However, she says cbg has gone as low as 80 at work, when she eats less.  She reports many symptoms.   Past Medical History  Diagnosis Date  . Chronic kidney disease   . Hypertension   . Hyperlipidemia   . Diabetes mellitus     Pt reports diagnosis in June 2011  . UTI (urinary tract infection) 01/09/2015  . Kidney transplant recipient     Past Surgical History  Procedure Laterality Date  . Kidney transplant  2007  . Thyroid lobectomy    . Renal biopsy Bilateral 2003    Social History   Social History  . Marital Status: Single    Spouse Name: N/A  . Number of Children: N/A  . Years of Education: N/A   Occupational History  . Not on file.   Social History Main Topics  . Smoking status: Never Smoker   . Smokeless tobacco: Never Used  . Alcohol Use: No  . Drug Use: No  . Sexual Activity: Yes    Birth Control/ Protection: None   Other Topics Concern  . Not on file   Social History Narrative    Current Outpatient Prescriptions on File Prior to Visit  Medication Sig Dispense Refill  .  acetaminophen (TYLENOL) 500 MG tablet Take 1,000 mg by mouth every 6 (six) hours as needed for mild pain.    . calcium carbonate (TUMS) 500 MG chewable tablet Chew 2 tablets by mouth 3 (three) times a week. Takes on Monday, wednesdays, and fridays    . diazepam (VALIUM) 5 MG tablet Take 1 tablet (5 mg total) by mouth every 6 (six) hours as needed for anxiety (spasms). 6 tablet 0  . famotidine (PEPCID) 20 MG tablet Take 1 tablet (20 mg total) by mouth daily. (Patient taking differently: Take 20 mg by mouth daily. For heartburn and acid reflux) 30 tablet 0  . glucose blood (BAYER CONTOUR NEXT TEST) test strip Check 4 times per day. DX CODE E11.9 300 each 12  . hydrOXYzine (ATARAX) 25 MG tablet Take 25 mg by mouth 2 (two) times daily as needed for itching.     Marland Kitchen lisinopril-hydrochlorothiazide (PRINZIDE,ZESTORETIC) 20-25 MG tablet Take 1 tablet by mouth daily as needed (blood pressure).   5  . mycophenolate (MYFORTIC) 360 MG TBEC Take 720 mg by mouth 2 (two) times daily.     . pantoprazole (PROTONIX) 20 MG tablet Take 1 tablet (20 mg total) by mouth daily. (Patient taking differently: Take 20 mg by mouth daily.  For heartburn and reflux) 30 tablet 0  . predniSONE (DELTASONE) 5 MG tablet Take 5 mg by mouth at bedtime.     . promethazine (PHENERGAN) 12.5 MG tablet Take 1 tablet (12.5 mg total) by mouth every 6 (six) hours as needed for nausea or vomiting. 30 tablet 0  . simvastatin (ZOCOR) 20 MG tablet Take 20 mg by mouth every evening.    . tacrolimus (PROGRAF) 1 MG capsule Take 2 mg by mouth 2 (two) times daily.      No current facility-administered medications on file prior to visit.    Allergies  Allergen Reactions  . Motrin [Ibuprofen] Shortness Of Breath and Itching    Per pt  . Banana Other (See Comments)    Sick on the stomach  . Diphenhydramine Hcl     REACTION: Stopped breathing in ICU  . Iron Dextran     REACTION: vein irritation  . Shellfish Allergy Hives    Family History    Problem Relation Age of Onset  . Arthritis Mother   . Diabetes Mother   . Hypertension Mother     BP 122/86 mmHg  Pulse 103  Ht 5\' 6"  (1.676 m)  Wt 176 lb (79.833 kg)  BMI 28.42 kg/m2  SpO2 97%  Review of Systems She denies hypoglycemia.     Objective:   Physical Exam VITAL SIGNS:  See vs page GENERAL: no distress Pulses: dorsalis pedis intact bilat.   MSK: no deformity of the feet CV: no leg edema Skin:  no ulcer on the feet.  normal color and temp on the feet. Neuro: sensation is intact to touch on the feet, but decreased from normal      Assessment & Plan:  DM: severe exacerbation.  Noncompliance with insulin dosing: I advised pt to take insulin as rx'ed.    Patient is advised the following: Patient Instructions  Please increase the tresiba to 90 units daily.   On this type of insulin schedule, you should eat meals on a regular schedule.  If a meal is missed or significantly delayed, your blood sugar could go low.   Please come back for a follow-up appointment in 1 month.   check your blood sugar 4 times a day: before the 3 meals, and at bedtime.  also check if you have symptoms of your blood sugar being too high or too low.  please keep a record of the readings and bring it to your next appointment here.  You can write it on any piece of paper.  please call us sooner if your blood sugar goes below 70, or if you have a lot of readings over 200.

## 2015-07-10 NOTE — Patient Instructions (Addendum)
Please increase the tresiba to 90 units daily.   On this type of insulin schedule, you should eat meals on a regular schedule.  If a meal is missed or significantly delayed, your blood sugar could go low.   Please come back for a follow-up appointment in 1 month.   check your blood sugar 4 times a day: before the 3 meals, and at bedtime.  also check if you have symptoms of your blood sugar being too high or too low.  please keep a record of the readings and bring it to your next appointment here.  You can write it on any piece of paper.  please call us sooner if your blood sugar goes below 70, or if you have a lot of readings over 200.

## 2015-07-17 DIAGNOSIS — E119 Type 2 diabetes mellitus without complications: Secondary | ICD-10-CM | POA: Diagnosis not present

## 2015-07-17 DIAGNOSIS — Z794 Long term (current) use of insulin: Secondary | ICD-10-CM | POA: Diagnosis not present

## 2015-07-17 LAB — HM DIABETES EYE EXAM

## 2015-07-19 ENCOUNTER — Encounter (HOSPITAL_COMMUNITY): Payer: Self-pay | Admitting: *Deleted

## 2015-07-19 ENCOUNTER — Emergency Department (HOSPITAL_COMMUNITY)
Admission: EM | Admit: 2015-07-19 | Discharge: 2015-07-20 | Disposition: A | Discharge status: Home / Self Care | Payer: Medicare Other | Attending: Emergency Medicine | Admitting: Emergency Medicine

## 2015-07-19 DIAGNOSIS — Z7952 Long term (current) use of systemic steroids: Secondary | ICD-10-CM | POA: Diagnosis not present

## 2015-07-19 DIAGNOSIS — Z794 Long term (current) use of insulin: Secondary | ICD-10-CM | POA: Insufficient documentation

## 2015-07-19 DIAGNOSIS — R5383 Other fatigue: Secondary | ICD-10-CM | POA: Insufficient documentation

## 2015-07-19 DIAGNOSIS — Z7951 Long term (current) use of inhaled steroids: Secondary | ICD-10-CM | POA: Insufficient documentation

## 2015-07-19 DIAGNOSIS — Z940 Kidney transplant status: Secondary | ICD-10-CM | POA: Diagnosis not present

## 2015-07-19 DIAGNOSIS — I120 Hypertensive chronic kidney disease with stage 5 chronic kidney disease or end stage renal disease: Secondary | ICD-10-CM | POA: Insufficient documentation

## 2015-07-19 DIAGNOSIS — Z87440 Personal history of urinary (tract) infections: Secondary | ICD-10-CM | POA: Insufficient documentation

## 2015-07-19 DIAGNOSIS — E1065 Type 1 diabetes mellitus with hyperglycemia: Secondary | ICD-10-CM | POA: Insufficient documentation

## 2015-07-19 DIAGNOSIS — R51 Headache: Secondary | ICD-10-CM | POA: Insufficient documentation

## 2015-07-19 DIAGNOSIS — J0100 Acute maxillary sinusitis, unspecified: Secondary | ICD-10-CM | POA: Diagnosis not present

## 2015-07-19 DIAGNOSIS — R05 Cough: Secondary | ICD-10-CM | POA: Diagnosis not present

## 2015-07-19 DIAGNOSIS — R000 Tachycardia, unspecified: Secondary | ICD-10-CM | POA: Insufficient documentation

## 2015-07-19 DIAGNOSIS — E785 Hyperlipidemia, unspecified: Secondary | ICD-10-CM | POA: Diagnosis not present

## 2015-07-19 DIAGNOSIS — N185 Chronic kidney disease, stage 5: Secondary | ICD-10-CM | POA: Insufficient documentation

## 2015-07-19 DIAGNOSIS — Z3202 Encounter for pregnancy test, result negative: Secondary | ICD-10-CM | POA: Diagnosis not present

## 2015-07-19 DIAGNOSIS — E1022 Type 1 diabetes mellitus with diabetic chronic kidney disease: Secondary | ICD-10-CM | POA: Insufficient documentation

## 2015-07-19 DIAGNOSIS — R0981 Nasal congestion: Secondary | ICD-10-CM | POA: Diagnosis present

## 2015-07-19 DIAGNOSIS — R739 Hyperglycemia, unspecified: Secondary | ICD-10-CM

## 2015-07-19 MED ORDER — PROCHLORPERAZINE EDISYLATE 5 MG/ML IJ SOLN
10.0000 mg | Freq: Once | INTRAMUSCULAR | Status: AC
  Administered 2015-07-20: 10 mg via INTRAVENOUS
  Filled 2015-07-19: qty 2

## 2015-07-19 MED ORDER — FLUTICASONE PROPIONATE 50 MCG/ACT NA SUSP
2.0000 | Freq: Every day | NASAL | Status: DC
  Filled 2015-07-19: qty 16

## 2015-07-19 MED ORDER — FLUTICASONE PROPIONATE 50 MCG/ACT NA SUSP
2.0000 | Freq: Every day | NASAL | Status: DC
  Administered 2015-07-20: 2 via NASAL

## 2015-07-19 MED ORDER — SODIUM CHLORIDE 0.9 % IV BOLUS (SEPSIS)
500.0000 mL | Freq: Once | INTRAVENOUS | Status: AC
  Administered 2015-07-20: 500 mL via INTRAVENOUS

## 2015-07-19 NOTE — ED Notes (Signed)
Patient transported to X-ray 

## 2015-07-19 NOTE — ED Notes (Signed)
Pt came in b/c she has a kidney transplant in April 2007 and she does not want it to affect her kidney

## 2015-07-19 NOTE — ED Notes (Signed)
Patient presents with c/o head congestion, headache, cough for approx 2 weeks

## 2015-07-19 NOTE — ED Provider Notes (Signed)
CSN: [id number]     Arrival date & time 07/19/15  2233 History   First MD Initiated Contact with Patient 07/19/15 2259     Chief Complaint  Patient presents with  . Nasal Congestion  . Cough  . Headache     (Consider location/radiation/quality/duration/timing/severity/associated sxs/prior Treatment) The history is provided by the patient and medical records. No language interpreter was used.     [name] is a 30 y.o. female  with a hx of HTN, IDDM, Kidney transplant (prograf) presents to the Emergency Department complaining of gradual, persistent, progressively worsening URI symptoms onset 1 week ago. Associated symptoms include nasal congestion, sneezing.  Pt reports development of left frontal headache with mild photophobia onset gradually 3 days ago.  Pt reports elevated CBG, but has eaten 3 bags of cough drops in the last 3 days.  Pt's significant other was dx with PNA last week.   Pt reports taking tylenol with some improvement. She is also taking zyrtec without relief.  Nothing makes her symptoms worse.  Pt denies fever, chills, headache, neck pain, chest pain, SOB, abd pain, N/V/D, weakness, dizziness, syncope.     Past Medical History  Diagnosis Date  . Chronic kidney disease   . Hypertension   . Hyperlipidemia   . Diabetes mellitus     Pt reports diagnosis in June 2011  . UTI (urinary tract infection) 01/09/2015  . Kidney transplant recipient    Past Surgical History  Procedure Laterality Date  . Kidney transplant  2007  . Thyroid lobectomy    . Renal biopsy Bilateral 2003   Family History  Problem Relation Age of Onset  . Arthritis Mother   . Diabetes Mother   . Hypertension Mother    Social History  Substance Use Topics  . Smoking status: Never Smoker   . Smokeless tobacco: Never Used  . Alcohol Use: No   OB History    No data available     Review of Systems  Constitutional: Positive for fatigue. Negative for fever, chills and appetite change.  HENT:  Positive for congestion, postnasal drip, rhinorrhea, sinus pressure and sore throat. Negative for ear discharge, ear pain and mouth sores.   Eyes: Negative for visual disturbance.  Respiratory: Positive for cough. Negative for chest tightness, shortness of breath, wheezing and stridor.   Cardiovascular: Negative for chest pain, palpitations and leg swelling.  Gastrointestinal: Negative for nausea, vomiting, abdominal pain and diarrhea.  Genitourinary: Negative for dysuria, urgency, frequency and hematuria.  Musculoskeletal: Negative for myalgias, back pain, arthralgias and neck stiffness.  Skin: Negative for rash.  Neurological: Positive for headaches. Negative for syncope, light-headedness and numbness.  Hematological: Negative for adenopathy.  Psychiatric/Behavioral: The patient is not nervous/anxious.   All other systems reviewed and are negative.     Allergies  Motrin; Banana; Diphenhydramine hcl; Iron dextran; and Shellfish allergy  Home Medications   Prior to Admission medications   Medication Sig Start Date End Date Taking? Authorizing Provider  acetaminophen (TYLENOL) 500 MG tablet Take 1,000 mg by mouth every 6 (six) hours as needed for mild pain.   Yes Historical Provider, MD  calcium carbonate (TUMS) 500 MG chewable tablet Chew 2 tablets by mouth 3 (three) times a week. Takes on Monday, wednesdays, and fridays   Yes Historical Provider, MD  cetirizine (ZYRTEC) 10 MG tablet Take 10 mg by mouth daily.   Yes Historical Provider, MD  diazepam (VALIUM) 5 MG tablet Take 1 tablet (5 mg total) by mouth every  6 (six) hours as needed for anxiety (spasms). 05/11/15  Yes Elnora Morrison, MD  famotidine (PEPCID) 20 MG tablet Take 1 tablet (20 mg total) by mouth daily. Patient taking differently: Take 20 mg by mouth daily. For heartburn and acid reflux 01/12/15  Yes Nita Sells, MD  hydrOXYzine (ATARAX) 25 MG tablet Take 25 mg by mouth 2 (two) times daily as needed for itching.    Yes  Historical Provider, MD  Insulin Degludec (TRESIBA FLEXTOUCH) 100 UNIT/ML SOPN Inject 90 Units into the skin daily. And pen needles 1/day 07/10/15  Yes Renato Shin, MD  lisinopril-hydrochlorothiazide (PRINZIDE,ZESTORETIC) 20-25 MG tablet Take 1 tablet by mouth daily as needed (blood pressure).  11/14/14  Yes Historical Provider, MD  loperamide (IMODIUM) 2 MG capsule Take 2 mg by mouth as needed for diarrhea or loose stools.    Yes Historical Provider, MD  mycophenolate (MYFORTIC) 360 MG TBEC Take 720 mg by mouth 2 (two) times daily.    Yes Historical Provider, MD  pantoprazole (PROTONIX) 20 MG tablet Take 1 tablet (20 mg total) by mouth daily. Patient taking differently: Take 20 mg by mouth daily. For heartburn and reflux 01/12/15  Yes Nita Sells, MD  predniSONE (DELTASONE) 5 MG tablet Take 5 mg by mouth at bedtime.    Yes Historical Provider, MD  promethazine (PHENERGAN) 12.5 MG tablet Take 1 tablet (12.5 mg total) by mouth every 6 (six) hours as needed for nausea or vomiting. 10/01/14  Yes Ripudeep Krystal Eaton, MD  simvastatin (ZOCOR) 20 MG tablet Take 20 mg by mouth every evening.   Yes Historical Provider, MD  tacrolimus (PROGRAF) 1 MG capsule Take 2 mg by mouth 2 (two) times daily.    Yes Historical Provider, MD  amoxicillin-clavulanate (AUGMENTIN) 875-125 MG tablet Take 1 tablet by mouth every 12 (twelve) hours. 07/20/15   Brya Simerly, PA-C  fluticasone (FLONASE) 50 MCG/ACT nasal spray Place 2 sprays into both nostrils daily. 07/20/15   Rexann Lueras, PA-C  glucose blood (BAYER CONTOUR NEXT TEST) test strip Check 4 times per day. DX CODE E11.9 06/13/15   Renato Shin, MD  guaiFENesin (MUCINEX) 600 MG 12 hr tablet Take 2 tablets (1,200 mg total) by mouth 2 (two) times daily. 07/20/15   Anny Sayler, PA-C   BP 141/109 mmHg  Pulse 118  Temp(Src) 98.6 F (37 C) (Oral)  Resp 20  Ht 5\' 6"  (1.676 m)  Wt 79.833 kg  BMI 28.42 kg/m2  SpO2 99% Physical Exam  Constitutional: She is  oriented to person, place, and time. She appears well-developed and well-nourished. No distress.  HENT:  Head: Normocephalic and atraumatic.  Right Ear: Tympanic membrane, external ear and ear canal normal.  Left Ear: Tympanic membrane, external ear and ear canal normal.  Nose: Mucosal edema and rhinorrhea present. No epistaxis. Right sinus exhibits no maxillary sinus tenderness and no frontal sinus tenderness. Left sinus exhibits maxillary sinus tenderness. Left sinus exhibits no frontal sinus tenderness.  Mouth/Throat: Uvula is midline, oropharynx is clear and moist and mucous membranes are normal. Mucous membranes are not pale and not cyanotic. No oropharyngeal exudate, posterior oropharyngeal edema, posterior oropharyngeal erythema or tonsillar abscesses.  Eyes: Conjunctivae are normal. Pupils are equal, round, and reactive to light.  Neck: Normal range of motion and full passive range of motion without pain.  Cardiovascular: Normal heart sounds and intact distal pulses.  Tachycardia present.   Pulmonary/Chest: Effort normal and breath sounds normal. No stridor.  Clear and equal breath sounds without focal wheezes, rhonchi,  rales  Abdominal: Soft. Bowel sounds are normal. There is no tenderness.  Musculoskeletal: Normal range of motion.  Lymphadenopathy:    She has no cervical adenopathy.  Neurological: She is alert and oriented to person, place, and time.  Skin: Skin is warm and dry. No rash noted. She is not diaphoretic.  Psychiatric: She has a normal mood and affect.  Nursing note and vitals reviewed.   ED Course  Procedures (including critical care time) Labs Review Labs Reviewed  URINALYSIS, ROUTINE W REFLEX MICROSCOPIC (NOT AT [facility]) - Abnormal; Notable for the following:    Glucose, UA >1000 (*)    Protein, ur 30 (*)    All other components within normal limits  CBC WITH DIFFERENTIAL/PLATELET - Abnormal; Notable for the following:    WBC 14.2 (*)    Hemoglobin 11.2 (*)     HCT 34.0 (*)    Neutro Abs 10.0 (*)    All other components within normal limits  COMPREHENSIVE METABOLIC PANEL - Abnormal; Notable for the following:    CO2 20 (*)    Glucose, Bld 390 (*)    BUN 29 (*)    Creatinine, Ser 1.78 (*)    Calcium 8.1 (*)    Total Protein 6.4 (*)    GFR calc non Af Amer 37 (*)    GFR calc Af Amer 43 (*)    All other components within normal limits  URINE MICROSCOPIC-ADD ON - Abnormal; Notable for the following:    Squamous Epithelial / LPF 0-5 (*)    Bacteria, UA RARE (*)    Crystals CA OXALATE CRYSTALS (*)    All other components within normal limits  I-STAT BETA HCG BLOOD, ED ([facility], [facility], [facility] ONLY) - Abnormal; Notable for the following:    I-stat hCG, quantitative 7.7 (*)    All other components within normal limits  POC URINE PREG, ED  CBG MONITORING, ED    Imaging Review Dg Chest 2 View  07/20/2015  CLINICAL DATA:  Dyspnea and cough 2 weeks. EXAM: CHEST  2 VIEW COMPARISON:  01/05/2015 FINDINGS: The lungs are clear. The pulmonary vasculature is normal. Heart size is normal. Hilar and mediastinal contours are unremarkable. There is no pleural effusion. IMPRESSION: No active cardiopulmonary disease. Electronically Signed   By: Andreas Newport M.D.   On: 07/20/2015 00:08   I have personally reviewed and evaluated these images and lab results as part of my medical decision-making.    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 She is very upset and tearful.  Tenderness to the left maxillary sinus.  Suspicious for acute sinusitis. CBC with mild cytosis of 14.2. Serum creatinine at baseline 1.78.  Urine pregnancy negative, beta-hCG 7.7 the patient has had previously minimally elevated beta-hCG. Suspect lab error.   Patient given migraine cocktail here in the emergency department with improvement in her headache. Fluid bolus given.  Will check her elevated but no evidence of DKA. No elevated anion gap.  No ketones in her urine.  5:55 AM Patient with persistent tachycardia. She's had multiple visits in the past she has been tachycardic to the same. Fluids have been given to a total of 2 L. Her CBG is decreasing. Her mucous membranes are moist and she does not appear  dehydrated.  Patient with leukocytosis. Low-grade fever at 99.9.  She is immunocompromised.  No evidence of pneumonia on chest x-ray. Urinalysis without evidence of urinary tract infection. No meningeal signs or nuchal rigidity.  Symptoms of acute sinusitis. Head CT is consistent with acute sinusitis and that does not show evidence of intracranial abscess.  Patient will be discharged home with symptomatic therapy and antibiotics for her acute sinusitis. Recommend follow-up with her primary care physician in 48 hours. Discussed reasons to return to the emergency department including high fevers, vision changes, worsening headaches or other concerns.  BP 123/81 mmHg  Pulse 114  Temp(Src) 99.9 F (37.7 C) (Oral)  Resp 20  Ht 5\' 6"  (1.676 m)  Wt 79.833 kg  BMI 28.42 kg/m2  SpO2 99%   Abigail Butts, PA-C 07/20/15 [id number]  Davonna Belling, MD 07/20/15 [phone]

## 2015-07-20 ENCOUNTER — Emergency Department (HOSPITAL_COMMUNITY): Payer: Medicare Other

## 2015-07-20 DIAGNOSIS — R51 Headache: Secondary | ICD-10-CM | POA: Diagnosis not present

## 2015-07-20 DIAGNOSIS — R05 Cough: Secondary | ICD-10-CM | POA: Diagnosis not present

## 2015-07-20 DIAGNOSIS — J0100 Acute maxillary sinusitis, unspecified: Secondary | ICD-10-CM | POA: Diagnosis not present

## 2015-07-20 LAB — CBC WITH DIFFERENTIAL/PLATELET
BASOS ABS: 0 10*3/uL (ref 0.0–0.1)
Basophils Relative: 0 %
EOS PCT: 3 %
Eosinophils Absolute: 0.4 10*3/uL (ref 0.0–0.7)
HEMATOCRIT: 34 % — AB (ref 36.0–46.0)
HEMOGLOBIN: 11.2 g/dL — AB (ref 12.0–15.0)
Lymphocytes Relative: 20 %
Lymphs Abs: 2.9 10*3/uL (ref 0.7–4.0)
MCH: 28.8 pg (ref 26.0–34.0)
MCHC: 32.9 g/dL (ref 30.0–36.0)
MCV: 87.4 fL (ref 78.0–100.0)
MONO ABS: 0.8 10*3/uL (ref 0.1–1.0)
MONOS PCT: 6 %
Neutro Abs: 10 10*3/uL — ABNORMAL HIGH (ref 1.7–7.7)
Neutrophils Relative %: 71 %
PLATELETS: 239 10*3/uL (ref 150–400)
RBC: 3.89 MIL/uL (ref 3.87–5.11)
RDW: 13.5 % (ref 11.5–15.5)
WBC: 14.2 10*3/uL — ABNORMAL HIGH (ref 4.0–10.5)

## 2015-07-20 LAB — COMPREHENSIVE METABOLIC PANEL
ALK PHOS: 66 U/L (ref 38–126)
ALT: 16 U/L (ref 14–54)
AST: 28 U/L (ref 15–41)
Albumin: 3.6 g/dL (ref 3.5–5.0)
Anion gap: 12 (ref 5–15)
BILIRUBIN TOTAL: 1 mg/dL (ref 0.3–1.2)
BUN: 29 mg/dL — AB (ref 6–20)
CALCIUM: 8.1 mg/dL — AB (ref 8.9–10.3)
CHLORIDE: 103 mmol/L (ref 101–111)
CO2: 20 mmol/L — ABNORMAL LOW (ref 22–32)
CREATININE: 1.78 mg/dL — AB (ref 0.44–1.00)
GFR, EST AFRICAN AMERICAN: 43 mL/min — AB (ref >60)
GFR, EST NON AFRICAN AMERICAN: 37 mL/min — AB (ref >60)
Glucose, Bld: 390 mg/dL — ABNORMAL HIGH (ref 65–99)
Potassium: 4.9 mmol/L (ref 3.5–5.1)
Sodium: 135 mmol/L (ref 135–145)
Total Protein: 6.4 g/dL — ABNORMAL LOW (ref 6.5–8.1)

## 2015-07-20 LAB — URINALYSIS, ROUTINE W REFLEX MICROSCOPIC
Bilirubin Urine: NEGATIVE
Hgb urine dipstick: NEGATIVE
KETONES UR: NEGATIVE mg/dL
Leukocytes, UA: NEGATIVE
Nitrite: NEGATIVE
PROTEIN: 30 mg/dL — AB
Specific Gravity, Urine: 1.024 (ref 1.005–1.030)
pH: 5 (ref 5.0–8.0)

## 2015-07-20 LAB — URINE MICROSCOPIC-ADD ON: RBC / HPF: NONE SEEN RBC/hpf (ref 0–5)

## 2015-07-20 LAB — I-STAT BETA HCG BLOOD, ED (MC, WL, AP ONLY): HCG, QUANTITATIVE: 7.7 m[IU]/mL — AB (ref <5)

## 2015-07-20 LAB — CBG MONITORING, ED: GLUCOSE-CAPILLARY: 309 mg/dL — AB (ref 65–99)

## 2015-07-20 LAB — POC URINE PREG, ED: PREG TEST UR: NEGATIVE

## 2015-07-20 MED ORDER — SODIUM CHLORIDE 0.9 % IV BOLUS (SEPSIS)
500.0000 mL | Freq: Once | INTRAVENOUS | Status: AC
  Administered 2015-07-20: 500 mL via INTRAVENOUS

## 2015-07-20 MED ORDER — SODIUM CHLORIDE 0.9 % IV BOLUS (SEPSIS)
1000.0000 mL | Freq: Once | INTRAVENOUS | Status: AC
  Administered 2015-07-20: 1000 mL via INTRAVENOUS

## 2015-07-20 MED ORDER — AMOXICILLIN-POT CLAVULANATE 875-125 MG PO TABS: 1.0000 | ORAL_TABLET | Freq: Two times a day (BID) | ORAL | Status: DC

## 2015-07-20 MED ORDER — ACETAMINOPHEN 500 MG PO TABS
1000.0000 mg | ORAL_TABLET | Freq: Once | ORAL | Status: AC
  Administered 2015-07-20: 1000 mg via ORAL
  Filled 2015-07-20: qty 2

## 2015-07-20 MED ORDER — FLUTICASONE PROPIONATE 50 MCG/ACT NA SUSP: 2.0000 | Freq: Every day | NASAL | Status: DC

## 2015-07-20 MED ORDER — GUAIFENESIN ER 600 MG PO TB12: 1200.0000 mg | ORAL_TABLET | Freq: Two times a day (BID) | ORAL | Status: DC

## 2015-07-20 NOTE — ED Notes (Signed)
Patient transported to CT 

## 2015-07-20 NOTE — ED Notes (Signed)
Provider notified of pt crying (in pain) and increased heart rate, pt had been advised that crying can increase headache pain.

## 2015-07-20 NOTE — ED Notes (Signed)
CBG 309 

## 2015-07-20 NOTE — Discharge Instructions (Signed)
1. Medications: Augmentin, usual home medications 2. Treatment: rest, drink plenty of fluids,  3. Follow Up: Please followup with your primary doctor in 2 days for discussion of your diagnoses and further evaluation after today's visit; if you do not have a primary care doctor use the resource guide provided to find one; Please return to the ER for her worsening symptoms, high fevers or other concerns    Sinusitis, Adult Sinusitis is redness, soreness, and inflammation of the paranasal sinuses. Paranasal sinuses are air pockets within the bones of your face. They are located beneath your eyes, in the middle of your forehead, and above your eyes. In healthy paranasal sinuses, mucus is able to drain out, and air is able to circulate through them by way of your nose. However, when your paranasal sinuses are inflamed, mucus and air can become trapped. This can allow bacteria and other germs to grow and cause infection. Sinusitis can develop quickly and last only a short time (acute) or continue over a long period (chronic). Sinusitis that lasts for more than 12 weeks is considered chronic. CAUSES Causes of sinusitis include:  Allergies.  Structural abnormalities, such as displacement of the cartilage that separates your nostrils (deviated septum), which can decrease the air flow through your nose and sinuses and affect sinus drainage.  Functional abnormalities, such as when the small hairs (cilia) that line your sinuses and help remove mucus do not work properly or are not present. SIGNS AND SYMPTOMS Symptoms of acute and chronic sinusitis are the same. The primary symptoms are pain and pressure around the affected sinuses. Other symptoms include:  Upper toothache.  Earache.  Headache.  Bad breath.  Decreased sense of smell and taste.  A cough, which worsens when you are lying flat.  Fatigue.  Fever.  Thick drainage from your nose, which often is green and may contain pus  (purulent).  Swelling and warmth over the affected sinuses. DIAGNOSIS Your health care provider will perform a physical exam. During your exam, your health care provider may perform any of the following to help determine if you have acute sinusitis or chronic sinusitis:  Look in your nose for signs of abnormal growths in your nostrils (nasal polyps).  Tap over the affected sinus to check for signs of infection.  View the inside of your sinuses using an imaging device that has a light attached (endoscope). If your health care provider suspects that you have chronic sinusitis, one or more of the following tests may be recommended:  Allergy tests.  Nasal culture. A sample of mucus is taken from your nose, sent to a lab, and screened for bacteria.  Nasal cytology. A sample of mucus is taken from your nose and examined by your health care provider to determine if your sinusitis is related to an allergy. TREATMENT Most cases of acute sinusitis are related to a viral infection and will resolve on their own within 10 days. Sometimes, medicines are prescribed to help relieve symptoms of both acute and chronic sinusitis. These may include pain medicines, decongestants, nasal steroid sprays, or saline sprays. However, for sinusitis related to a bacterial infection, your health care provider will prescribe antibiotic medicines. These are medicines that will help kill the bacteria causing the infection. Rarely, sinusitis is caused by a fungal infection. In these cases, your health care provider will prescribe antifungal medicine. For some cases of chronic sinusitis, surgery is needed. Generally, these are cases in which sinusitis recurs more than 3 times per year, despite  other treatments. HOME CARE INSTRUCTIONS  Drink plenty of water. Water helps thin the mucus so your sinuses can drain more easily.  Use a humidifier.  Inhale steam 3-4 times a day (for example, sit in the bathroom with the shower  running).  Apply a warm, moist washcloth to your face 3-4 times a day, or as directed by your health care provider.  Use saline nasal sprays to help moisten and clean your sinuses.  Take medicines only as directed by your health care provider.  If you were prescribed either an antibiotic or antifungal medicine, finish it all even if you start to feel better. SEEK IMMEDIATE MEDICAL CARE IF:  You have increasing pain or severe headaches.  You have nausea, vomiting, or drowsiness.  You have swelling around your face.  You have vision problems.  You have a stiff neck.  You have difficulty breathing.   This information is not intended to replace advice given to you by your health care provider. Make sure you discuss any questions you have with your health care provider.   Document Released: 03/01/2005 Document Revised: 03/22/2014 Document Reviewed: 03/16/2011 Elsevier Interactive Patient Education [employer].

## 2015-07-24 ENCOUNTER — Encounter (HOSPITAL_COMMUNITY)
Admission: RE | Admit: 2015-07-24 | Discharge: 2015-07-24 | Disposition: A | Discharge status: Home / Self Care | Payer: Medicare Other | Source: Ambulatory Visit | Attending: Nephrology | Admitting: Nephrology

## 2015-07-24 DIAGNOSIS — N183 Chronic kidney disease, stage 3 (moderate): Secondary | ICD-10-CM | POA: Diagnosis not present

## 2015-07-24 DIAGNOSIS — Z940 Kidney transplant status: Secondary | ICD-10-CM | POA: Diagnosis not present

## 2015-07-24 DIAGNOSIS — D631 Anemia in chronic kidney disease: Secondary | ICD-10-CM | POA: Insufficient documentation

## 2015-07-24 LAB — IRON AND TIBC
IRON: 53 ug/dL (ref 28–170)
Saturation Ratios: 26 % (ref 10.4–31.8)
TIBC: 202 ug/dL — AB (ref 250–450)
UIBC: 149 ug/dL

## 2015-07-24 LAB — POCT HEMOGLOBIN-HEMACUE: HEMOGLOBIN: 10.4 g/dL — AB (ref 12.0–15.0)

## 2015-07-24 LAB — FERRITIN: FERRITIN: 1345 ng/mL — AB (ref 11–307)

## 2015-07-24 MED ORDER — EPOETIN ALFA 20000 UNIT/ML IJ SOLN
INTRAMUSCULAR | Status: AC
  Administered 2015-07-24: 20000 [IU] via SUBCUTANEOUS
  Filled 2015-07-24: qty 1

## 2015-07-24 MED ORDER — EPOETIN ALFA 40000 UNIT/ML IJ SOLN: 30000.0000 [IU] | INTRAMUSCULAR | Status: DC

## 2015-07-24 MED ORDER — EPOETIN ALFA 10000 UNIT/ML IJ SOLN
INTRAMUSCULAR | Status: AC
  Administered 2015-07-24: 10000 [IU] via SUBCUTANEOUS
  Filled 2015-07-24: qty 1

## 2015-08-06 ENCOUNTER — Other Ambulatory Visit (HOSPITAL_COMMUNITY): Payer: Self-pay | Admitting: *Deleted

## 2015-08-06 DIAGNOSIS — J302 Other seasonal allergic rhinitis: Secondary | ICD-10-CM | POA: Diagnosis not present

## 2015-08-06 DIAGNOSIS — Z940 Kidney transplant status: Secondary | ICD-10-CM | POA: Diagnosis not present

## 2015-08-06 DIAGNOSIS — K219 Gastro-esophageal reflux disease without esophagitis: Secondary | ICD-10-CM | POA: Diagnosis not present

## 2015-08-06 DIAGNOSIS — I10 Essential (primary) hypertension: Secondary | ICD-10-CM | POA: Diagnosis not present

## 2015-08-06 DIAGNOSIS — E1121 Type 2 diabetes mellitus with diabetic nephropathy: Secondary | ICD-10-CM | POA: Diagnosis not present

## 2015-08-07 ENCOUNTER — Encounter: Payer: Self-pay | Admitting: Endocrinology

## 2015-08-07 ENCOUNTER — Encounter (HOSPITAL_COMMUNITY)
Admission: RE | Admit: 2015-08-07 | Discharge: 2015-08-07 | Disposition: A | Discharge status: Home / Self Care | Payer: Medicare Other | Source: Ambulatory Visit | Attending: Internal Medicine | Admitting: Internal Medicine

## 2015-08-07 ENCOUNTER — Ambulatory Visit (INDEPENDENT_AMBULATORY_CARE_PROVIDER_SITE_OTHER): Payer: Medicare Other | Admitting: Endocrinology

## 2015-08-07 VITALS — BP 128/86 | HR 100 | Wt 177.0 lb

## 2015-08-07 DIAGNOSIS — N185 Chronic kidney disease, stage 5: Secondary | ICD-10-CM | POA: Diagnosis not present

## 2015-08-07 DIAGNOSIS — D631 Anemia in chronic kidney disease: Secondary | ICD-10-CM | POA: Diagnosis not present

## 2015-08-07 DIAGNOSIS — E1022 Type 1 diabetes mellitus with diabetic chronic kidney disease: Secondary | ICD-10-CM | POA: Diagnosis not present

## 2015-08-07 DIAGNOSIS — N183 Chronic kidney disease, stage 3 (moderate): Secondary | ICD-10-CM | POA: Diagnosis not present

## 2015-08-07 LAB — POCT HEMOGLOBIN-HEMACUE: HEMOGLOBIN: 10.7 g/dL — AB (ref 12.0–15.0)

## 2015-08-07 MED ORDER — EPOETIN ALFA 20000 UNIT/ML IJ SOLN
INTRAMUSCULAR | Status: AC
  Administered 2015-08-07: 20000 [IU] via SUBCUTANEOUS
  Filled 2015-08-07: qty 1

## 2015-08-07 MED ORDER — EPOETIN ALFA 40000 UNIT/ML IJ SOLN: 30000.0000 [IU] | INTRAMUSCULAR | Status: DC

## 2015-08-07 MED ORDER — EPOETIN ALFA 10000 UNIT/ML IJ SOLN
INTRAMUSCULAR | Status: AC
  Administered 2015-08-07: 10000 [IU] via SUBCUTANEOUS
  Filled 2015-08-07: qty 1

## 2015-08-07 NOTE — Patient Instructions (Addendum)
Please continue the tresiba, 90 units daily.  If you miss a shot, take it as soon as you remember.   On this type of insulin schedule, you should eat meals on a regular schedule.  If a meal is missed or significantly delayed, your blood sugar could go low.   Please come back for a follow-up appointment in 3 months.   check your blood sugar 4 times a day: before the 3 meals, and at bedtime.  also check if you have symptoms of your blood sugar being too high or too low.  please keep a record of the readings and bring it to your next appointment here.  You can write it on any piece of paper.  please call us sooner if your blood sugar goes below 70, or if you have a lot of readings over 200.

## 2015-08-07 NOTE — Progress Notes (Signed)
Subjective:    Patient ID: [name], female    DOB: Jan 10, 1986, 30 y.o.   MRN: [id number]  HPI Pt returns for f/u of diabetes mellitus: DM type: 1 Dx'ed: 2012, when she was on prednisone Complications: polyneuropathy and renal failure (transplant in 2007).   Therapy: insulin since soon after dx.   GDM: (G0).   DKA: several episodes, most recently in mid-2016.  Severe hypoglycemia: never.   Pancreatitis: never.  Other: she took pump rx during 2016, but was changed to qd insulin, due to poor results; she is chronically on prednisone; pt has had TAH (at prior ov's here, she has reported being at risk for pregnancy, even after TAH).   Interval history: no cbg record, but states cbg's vary from 100-320.  There is no trend throughout the day. pt states she feels well in general.  She says she misses the insulin 1-2 times per week.  Past Medical History  Diagnosis Date  . Chronic kidney disease   . Hypertension   . Hyperlipidemia   . Diabetes mellitus     Pt reports diagnosis in June 2011  . UTI (urinary tract infection) 01/09/2015  . Kidney transplant recipient     Past Surgical History  Procedure Laterality Date  . Kidney transplant  2007  . Thyroid lobectomy    . Renal biopsy Bilateral 2003    Social History   Social History  . Marital Status: Single    Spouse Name: N/A  . Number of Children: N/A  . Years of Education: N/A   Occupational History  . Not on file.   Social History Main Topics  . Smoking status: Never Smoker   . Smokeless tobacco: Never Used  . Alcohol Use: No  . Drug Use: No  . Sexual Activity: Yes    Birth Control/ Protection: None   Other Topics Concern  . Not on file   Social History Narrative    Current Outpatient Prescriptions on File Prior to Visit  Medication Sig Dispense Refill  . acetaminophen (TYLENOL) 500 MG tablet Take 1,000 mg by mouth every 6 (six) hours as needed for mild pain.    Marland Kitchen amoxicillin-clavulanate (AUGMENTIN)  875-125 MG tablet Take 1 tablet by mouth every 12 (twelve) hours. 14 tablet 0  . calcium carbonate (TUMS) 500 MG chewable tablet Chew 2 tablets by mouth 3 (three) times a week. Takes on Monday, wednesdays, and fridays    . cetirizine (ZYRTEC) 10 MG tablet Take 10 mg by mouth daily.    . diazepam (VALIUM) 5 MG tablet Take 1 tablet (5 mg total) by mouth every 6 (six) hours as needed for anxiety (spasms). 6 tablet 0  . famotidine (PEPCID) 20 MG tablet Take 1 tablet (20 mg total) by mouth daily. (Patient taking differently: Take 20 mg by mouth daily. For heartburn and acid reflux) 30 tablet 0  . fluticasone (FLONASE) 50 MCG/ACT nasal spray Place 2 sprays into both nostrils daily. 9.9 g 2  . glucose blood (BAYER CONTOUR NEXT TEST) test strip Check 4 times per day. DX CODE E11.9 300 each 12  . guaiFENesin (MUCINEX) 600 MG 12 hr tablet Take 2 tablets (1,200 mg total) by mouth 2 (two) times daily. 20 tablet 0  . hydrOXYzine (ATARAX) 25 MG tablet Take 25 mg by mouth 2 (two) times daily as needed for itching.     . Insulin Degludec (TRESIBA FLEXTOUCH) 100 UNIT/ML SOPN Inject 90 Units into the skin daily. And pen needles 1/day 10  pen 11  . lisinopril-hydrochlorothiazide (PRINZIDE,ZESTORETIC) 20-25 MG tablet Take 1 tablet by mouth daily as needed (blood pressure).   5  . loperamide (IMODIUM) 2 MG capsule Take 2 mg by mouth as needed for diarrhea or loose stools.     . mycophenolate (MYFORTIC) 360 MG TBEC Take 720 mg by mouth 2 (two) times daily.     . pantoprazole (PROTONIX) 20 MG tablet Take 1 tablet (20 mg total) by mouth daily. (Patient taking differently: Take 20 mg by mouth daily. For heartburn and reflux) 30 tablet 0  . predniSONE (DELTASONE) 5 MG tablet Take 5 mg by mouth at bedtime.     . promethazine (PHENERGAN) 12.5 MG tablet Take 1 tablet (12.5 mg total) by mouth every 6 (six) hours as needed for nausea or vomiting. 30 tablet 0  . simvastatin (ZOCOR) 20 MG tablet Take 20 mg by mouth every evening.      . tacrolimus (PROGRAF) 1 MG capsule Take 2 mg by mouth 2 (two) times daily.      No current facility-administered medications on file prior to visit.    Allergies  Allergen Reactions  . Motrin [Ibuprofen] Shortness Of Breath and Itching    Per pt  . Banana Other (See Comments)    Sick on the stomach  . Diphenhydramine Hcl     REACTION: Stopped breathing in ICU  . Iron Dextran     REACTION: vein irritation  . Shellfish Allergy Hives    Family History  Problem Relation Age of Onset  . Arthritis Mother   . Diabetes Mother   . Hypertension Mother     BP 128/86 mmHg  Pulse 100  Wt 177 lb (80.287 kg)  SpO2 96%  Review of Systems She denies hypoglycemia.     Objective:   Physical Exam VITAL SIGNS:  See vs page GENERAL: no distress Pulses: dorsalis pedis intact bilat.   MSK: no deformity of the feet CV: no leg edema.  Skin:  no ulcer on the feet, but the skin is dry.  normal color and temp on the feet. Neuro: sensation is intact to touch on the feet, but decreased from normal.        Assessment & Plan:  DM: ongoing poor control Noncompliance with cbg recording and insulin, persistent:   Patient is advised the following: Patient Instructions  Please continue the tresiba, 90 units daily.  If you miss a shot, take it as soon as you remember.   On this type of insulin schedule, you should eat meals on a regular schedule.  If a meal is missed or significantly delayed, your blood sugar could go low.   Please come back for a follow-up appointment in 3 months.   check your blood sugar 4 times a day: before the 3 meals, and at bedtime.  also check if you have symptoms of your blood sugar being too high or too low.  please keep a record of the readings and bring it to your next appointment here.  You can write it on any piece of paper.  please call us sooner if your blood sugar goes below 70, or if you have a lot of readings over 200.

## 2015-08-08 DIAGNOSIS — E139 Other specified diabetes mellitus without complications: Secondary | ICD-10-CM | POA: Diagnosis not present

## 2015-08-08 DIAGNOSIS — J0100 Acute maxillary sinusitis, unspecified: Secondary | ICD-10-CM | POA: Diagnosis not present

## 2015-08-08 DIAGNOSIS — I10 Essential (primary) hypertension: Secondary | ICD-10-CM | POA: Diagnosis not present

## 2015-08-08 DIAGNOSIS — K219 Gastro-esophageal reflux disease without esophagitis: Secondary | ICD-10-CM | POA: Diagnosis not present

## 2015-08-08 DIAGNOSIS — E785 Hyperlipidemia, unspecified: Secondary | ICD-10-CM | POA: Diagnosis not present

## 2015-08-08 DIAGNOSIS — D649 Anemia, unspecified: Secondary | ICD-10-CM | POA: Diagnosis not present

## 2015-08-08 DIAGNOSIS — Z940 Kidney transplant status: Secondary | ICD-10-CM | POA: Diagnosis not present

## 2015-08-19 DIAGNOSIS — J302 Other seasonal allergic rhinitis: Secondary | ICD-10-CM | POA: Diagnosis not present

## 2015-08-19 DIAGNOSIS — K219 Gastro-esophageal reflux disease without esophagitis: Secondary | ICD-10-CM | POA: Diagnosis not present

## 2015-08-19 DIAGNOSIS — E1121 Type 2 diabetes mellitus with diabetic nephropathy: Secondary | ICD-10-CM | POA: Diagnosis not present

## 2015-08-19 DIAGNOSIS — I10 Essential (primary) hypertension: Secondary | ICD-10-CM | POA: Diagnosis not present

## 2015-08-19 DIAGNOSIS — Z940 Kidney transplant status: Secondary | ICD-10-CM | POA: Diagnosis not present

## 2015-08-21 ENCOUNTER — Encounter (HOSPITAL_COMMUNITY)
Admission: RE | Admit: 2015-08-21 | Discharge: 2015-08-21 | Disposition: A | Discharge status: Home / Self Care | Payer: Medicare Other | Source: Ambulatory Visit | Attending: Nephrology | Admitting: Nephrology

## 2015-08-21 DIAGNOSIS — D631 Anemia in chronic kidney disease: Secondary | ICD-10-CM | POA: Diagnosis not present

## 2015-08-21 DIAGNOSIS — N183 Chronic kidney disease, stage 3 (moderate): Secondary | ICD-10-CM | POA: Insufficient documentation

## 2015-08-21 LAB — IRON AND TIBC
IRON: 79 ug/dL (ref 28–170)
Saturation Ratios: 35 % — ABNORMAL HIGH (ref 10.4–31.8)
TIBC: 225 ug/dL — ABNORMAL LOW (ref 250–450)
UIBC: 146 ug/dL

## 2015-08-21 LAB — POCT HEMOGLOBIN-HEMACUE: Hemoglobin: 10.8 g/dL — ABNORMAL LOW (ref 12.0–15.0)

## 2015-08-21 LAB — FERRITIN: FERRITIN: 1333 ng/mL — AB (ref 11–307)

## 2015-08-21 MED ORDER — EPOETIN ALFA 10000 UNIT/ML IJ SOLN
INTRAMUSCULAR | Status: AC
  Administered 2015-08-21: 10000 [IU]
  Filled 2015-08-21: qty 1

## 2015-08-21 MED ORDER — EPOETIN ALFA 40000 UNIT/ML IJ SOLN: 30000.0000 [IU] | INTRAMUSCULAR | Status: DC

## 2015-08-21 MED ORDER — EPOETIN ALFA 20000 UNIT/ML IJ SOLN
INTRAMUSCULAR | Status: AC
  Administered 2015-08-21: 20000 [IU]
  Filled 2015-08-21: qty 1

## 2015-08-31 ENCOUNTER — Encounter (HOSPITAL_COMMUNITY): Payer: Self-pay | Admitting: Emergency Medicine

## 2015-08-31 ENCOUNTER — Emergency Department (HOSPITAL_COMMUNITY)
Admission: EM | Admit: 2015-08-31 | Discharge: 2015-09-01 | Disposition: A | Discharge status: Home / Self Care | Payer: Medicare Other | Attending: Dermatology | Admitting: Dermatology

## 2015-08-31 DIAGNOSIS — N189 Chronic kidney disease, unspecified: Secondary | ICD-10-CM | POA: Insufficient documentation

## 2015-08-31 DIAGNOSIS — T7840XA Allergy, unspecified, initial encounter: Secondary | ICD-10-CM | POA: Insufficient documentation

## 2015-08-31 DIAGNOSIS — E119 Type 2 diabetes mellitus without complications: Secondary | ICD-10-CM | POA: Insufficient documentation

## 2015-08-31 DIAGNOSIS — Z794 Long term (current) use of insulin: Secondary | ICD-10-CM | POA: Diagnosis not present

## 2015-08-31 DIAGNOSIS — I129 Hypertensive chronic kidney disease with stage 1 through stage 4 chronic kidney disease, or unspecified chronic kidney disease: Secondary | ICD-10-CM | POA: Insufficient documentation

## 2015-08-31 DIAGNOSIS — Z79899 Other long term (current) drug therapy: Secondary | ICD-10-CM | POA: Insufficient documentation

## 2015-08-31 DIAGNOSIS — Z5321 Procedure and treatment not carried out due to patient leaving prior to being seen by health care provider: Secondary | ICD-10-CM | POA: Insufficient documentation

## 2015-08-31 LAB — COMPREHENSIVE METABOLIC PANEL
ALK PHOS: 73 U/L (ref 38–126)
ALT: 13 U/L — AB (ref 14–54)
AST: 17 U/L (ref 15–41)
Albumin: 4 g/dL (ref 3.5–5.0)
Anion gap: 8 (ref 5–15)
BUN: 31 mg/dL — AB (ref 6–20)
CALCIUM: 8.9 mg/dL (ref 8.9–10.3)
CO2: 19 mmol/L — AB (ref 22–32)
CREATININE: 1.6 mg/dL — AB (ref 0.44–1.00)
Chloride: 109 mmol/L (ref 101–111)
GFR calc Af Amer: 48 mL/min — ABNORMAL LOW (ref >60)
GFR calc non Af Amer: 42 mL/min — ABNORMAL LOW (ref >60)
GLUCOSE: 143 mg/dL — AB (ref 65–99)
Potassium: 4.1 mmol/L (ref 3.5–5.1)
Sodium: 136 mmol/L (ref 135–145)
Total Bilirubin: 0.3 mg/dL (ref 0.3–1.2)
Total Protein: 7.2 g/dL (ref 6.5–8.1)

## 2015-08-31 LAB — CBC WITH DIFFERENTIAL/PLATELET
Basophils Absolute: 0 10*3/uL (ref 0.0–0.1)
Basophils Relative: 0 %
EOS ABS: 0.3 10*3/uL (ref 0.0–0.7)
EOS PCT: 2 %
HCT: 38.7 % (ref 36.0–46.0)
Hemoglobin: 12 g/dL (ref 12.0–15.0)
LYMPHS ABS: 3.5 10*3/uL (ref 0.7–4.0)
Lymphocytes Relative: 28 %
MCH: 27.8 pg (ref 26.0–34.0)
MCHC: 31 g/dL (ref 30.0–36.0)
MCV: 89.6 fL (ref 78.0–100.0)
MONO ABS: 0.8 10*3/uL (ref 0.1–1.0)
MONOS PCT: 6 %
Neutro Abs: 7.8 10*3/uL — ABNORMAL HIGH (ref 1.7–7.7)
Neutrophils Relative %: 64 %
PLATELETS: 241 10*3/uL (ref 150–400)
RBC: 4.32 MIL/uL (ref 3.87–5.11)
RDW: 14.4 % (ref 11.5–15.5)
WBC: 12.3 10*3/uL — AB (ref 4.0–10.5)

## 2015-08-31 NOTE — ED Notes (Signed)
Pt sts itching all over after eating bo round today; pt sts kidney transplant pt; pt sts allergic to benadryl

## 2015-08-31 NOTE — ED Notes (Signed)
Pt sts took 150mg  of vistaril

## 2015-09-04 ENCOUNTER — Encounter (HOSPITAL_COMMUNITY)
Admission: RE | Admit: 2015-09-04 | Discharge: 2015-09-04 | Disposition: A | Discharge status: Home / Self Care | Payer: Medicare Other | Source: Ambulatory Visit | Attending: Nephrology | Admitting: Nephrology

## 2015-09-04 DIAGNOSIS — Z940 Kidney transplant status: Secondary | ICD-10-CM | POA: Diagnosis not present

## 2015-09-04 DIAGNOSIS — D631 Anemia in chronic kidney disease: Secondary | ICD-10-CM | POA: Diagnosis not present

## 2015-09-04 DIAGNOSIS — N183 Chronic kidney disease, stage 3 (moderate): Secondary | ICD-10-CM | POA: Diagnosis not present

## 2015-09-04 DIAGNOSIS — Z79899 Other long term (current) drug therapy: Secondary | ICD-10-CM | POA: Diagnosis not present

## 2015-09-04 LAB — POCT HEMOGLOBIN-HEMACUE: Hemoglobin: 11.4 g/dL — ABNORMAL LOW (ref 12.0–15.0)

## 2015-09-04 MED ORDER — EPOETIN ALFA 40000 UNIT/ML IJ SOLN: 30000.0000 [IU] | INTRAMUSCULAR | Status: DC

## 2015-09-04 MED ORDER — EPOETIN ALFA 10000 UNIT/ML IJ SOLN
INTRAMUSCULAR | Status: AC
Start: 2015-09-04 — End: 2015-09-04
  Administered 2015-09-04: 10000 [IU] via SUBCUTANEOUS
  Filled 2015-09-04: qty 1

## 2015-09-04 MED ORDER — EPOETIN ALFA 20000 UNIT/ML IJ SOLN
INTRAMUSCULAR | Status: AC
  Administered 2015-09-04: 20000 [IU] via SUBCUTANEOUS
  Filled 2015-09-04: qty 1

## 2015-09-18 ENCOUNTER — Encounter (HOSPITAL_COMMUNITY)
Admission: RE | Admit: 2015-09-18 | Discharge: 2015-09-18 | Disposition: A | Discharge status: Home / Self Care | Payer: Medicare Other | Source: Ambulatory Visit | Attending: Nephrology | Admitting: Nephrology

## 2015-09-18 DIAGNOSIS — N183 Chronic kidney disease, stage 3 (moderate): Secondary | ICD-10-CM | POA: Insufficient documentation

## 2015-09-18 DIAGNOSIS — D631 Anemia in chronic kidney disease: Secondary | ICD-10-CM | POA: Insufficient documentation

## 2015-09-18 LAB — POCT HEMOGLOBIN-HEMACUE: HEMOGLOBIN: 11.1 g/dL — AB (ref 12.0–15.0)

## 2015-09-18 LAB — FERRITIN: FERRITIN: 1408 ng/mL — AB (ref 11–307)

## 2015-09-18 LAB — IRON AND TIBC
Iron: 79 ug/dL (ref 28–170)
SATURATION RATIOS: 36 % — AB (ref 10.4–31.8)
TIBC: 217 ug/dL — AB (ref 250–450)
UIBC: 138 ug/dL

## 2015-09-18 MED ORDER — EPOETIN ALFA 20000 UNIT/ML IJ SOLN
INTRAMUSCULAR | Status: AC
  Administered 2015-09-18: 20000 [IU]
  Filled 2015-09-18: qty 1

## 2015-09-18 MED ORDER — EPOETIN ALFA 10000 UNIT/ML IJ SOLN
INTRAMUSCULAR | Status: AC
  Administered 2015-09-18: 10000 [IU]
  Filled 2015-09-18: qty 1

## 2015-09-18 MED ORDER — EPOETIN ALFA 40000 UNIT/ML IJ SOLN: 30000.0000 [IU] | INTRAMUSCULAR | Status: DC

## 2015-09-29 DIAGNOSIS — Z940 Kidney transplant status: Secondary | ICD-10-CM | POA: Diagnosis not present

## 2015-10-02 ENCOUNTER — Encounter (HOSPITAL_COMMUNITY)
Admission: RE | Admit: 2015-10-02 | Discharge: 2015-10-02 | Disposition: A | Discharge status: Home / Self Care | Payer: Medicare Other | Source: Ambulatory Visit | Attending: Nephrology | Admitting: Nephrology

## 2015-10-02 DIAGNOSIS — E139 Other specified diabetes mellitus without complications: Secondary | ICD-10-CM | POA: Diagnosis not present

## 2015-10-02 DIAGNOSIS — D649 Anemia, unspecified: Secondary | ICD-10-CM | POA: Diagnosis not present

## 2015-10-02 DIAGNOSIS — Z940 Kidney transplant status: Secondary | ICD-10-CM | POA: Diagnosis not present

## 2015-10-02 DIAGNOSIS — D631 Anemia in chronic kidney disease: Secondary | ICD-10-CM | POA: Diagnosis not present

## 2015-10-02 DIAGNOSIS — I10 Essential (primary) hypertension: Secondary | ICD-10-CM | POA: Diagnosis not present

## 2015-10-02 DIAGNOSIS — Z6829 Body mass index (BMI) 29.0-29.9, adult: Secondary | ICD-10-CM | POA: Diagnosis not present

## 2015-10-02 DIAGNOSIS — E785 Hyperlipidemia, unspecified: Secondary | ICD-10-CM | POA: Diagnosis not present

## 2015-10-02 DIAGNOSIS — N183 Chronic kidney disease, stage 3 (moderate): Secondary | ICD-10-CM | POA: Diagnosis not present

## 2015-10-02 LAB — POCT HEMOGLOBIN-HEMACUE: Hemoglobin: 10.1 g/dL — ABNORMAL LOW (ref 12.0–15.0)

## 2015-10-02 MED ORDER — EPOETIN ALFA 10000 UNIT/ML IJ SOLN
INTRAMUSCULAR | Status: AC
  Administered 2015-10-02: 10000 [IU] via SUBCUTANEOUS
  Filled 2015-10-02: qty 1

## 2015-10-02 MED ORDER — EPOETIN ALFA 20000 UNIT/ML IJ SOLN
INTRAMUSCULAR | Status: AC
  Administered 2015-10-02: 20000 [IU] via SUBCUTANEOUS
  Filled 2015-10-02: qty 1

## 2015-10-02 MED ORDER — EPOETIN ALFA 40000 UNIT/ML IJ SOLN
30000.0000 [IU] | INTRAMUSCULAR | Status: DC
Start: 2015-10-02 — End: 2015-10-03

## 2015-10-10 DIAGNOSIS — E1121 Type 2 diabetes mellitus with diabetic nephropathy: Secondary | ICD-10-CM | POA: Diagnosis not present

## 2015-10-10 DIAGNOSIS — I10 Essential (primary) hypertension: Secondary | ICD-10-CM | POA: Diagnosis not present

## 2015-10-10 DIAGNOSIS — Z940 Kidney transplant status: Secondary | ICD-10-CM | POA: Diagnosis not present

## 2015-10-10 DIAGNOSIS — K219 Gastro-esophageal reflux disease without esophagitis: Secondary | ICD-10-CM | POA: Diagnosis not present

## 2015-10-16 ENCOUNTER — Encounter (HOSPITAL_COMMUNITY)
Admission: RE | Admit: 2015-10-16 | Discharge: 2015-10-16 | Disposition: A | Discharge status: Home / Self Care | Payer: Medicare Other | Source: Ambulatory Visit | Attending: Nephrology | Admitting: Nephrology

## 2015-10-16 DIAGNOSIS — D631 Anemia in chronic kidney disease: Secondary | ICD-10-CM | POA: Insufficient documentation

## 2015-10-16 DIAGNOSIS — N183 Chronic kidney disease, stage 3 (moderate): Secondary | ICD-10-CM | POA: Insufficient documentation

## 2015-10-16 DIAGNOSIS — N182 Chronic kidney disease, stage 2 (mild): Secondary | ICD-10-CM

## 2015-10-16 LAB — FERRITIN: FERRITIN: 1110 ng/mL — AB (ref 11–307)

## 2015-10-16 LAB — IRON AND TIBC
Iron: 78 ug/dL (ref 28–170)
Saturation Ratios: 34 % — ABNORMAL HIGH (ref 10.4–31.8)
TIBC: 231 ug/dL — ABNORMAL LOW (ref 250–450)
UIBC: 153 ug/dL

## 2015-10-16 LAB — POCT HEMOGLOBIN-HEMACUE: HEMOGLOBIN: 10.7 g/dL — AB (ref 12.0–15.0)

## 2015-10-16 MED ORDER — EPOETIN ALFA 20000 UNIT/ML IJ SOLN
INTRAMUSCULAR | Status: AC
  Administered 2015-10-16: 20000 [IU] via SUBCUTANEOUS
  Filled 2015-10-16: qty 1

## 2015-10-16 MED ORDER — EPOETIN ALFA 40000 UNIT/ML IJ SOLN
30000.0000 [IU] | INTRAMUSCULAR | Status: DC
Start: 2015-10-16 — End: 2015-10-17

## 2015-10-16 MED ORDER — EPOETIN ALFA 10000 UNIT/ML IJ SOLN
INTRAMUSCULAR | Status: AC
Start: 2015-10-16 — End: 2015-10-16
  Administered 2015-10-16: 10000 [IU] via SUBCUTANEOUS
  Filled 2015-10-16: qty 1

## 2015-10-20 ENCOUNTER — Emergency Department (HOSPITAL_COMMUNITY)
Admission: EM | Admit: 2015-10-20 | Discharge: 2015-10-20 | Disposition: A | Discharge status: Home / Self Care | Payer: Medicare Other | Attending: Emergency Medicine | Admitting: Emergency Medicine

## 2015-10-20 ENCOUNTER — Encounter (HOSPITAL_COMMUNITY): Payer: Self-pay | Admitting: Emergency Medicine

## 2015-10-20 DIAGNOSIS — I129 Hypertensive chronic kidney disease with stage 1 through stage 4 chronic kidney disease, or unspecified chronic kidney disease: Secondary | ICD-10-CM | POA: Diagnosis not present

## 2015-10-20 DIAGNOSIS — E1122 Type 2 diabetes mellitus with diabetic chronic kidney disease: Secondary | ICD-10-CM | POA: Insufficient documentation

## 2015-10-20 DIAGNOSIS — Z7984 Long term (current) use of oral hypoglycemic drugs: Secondary | ICD-10-CM | POA: Insufficient documentation

## 2015-10-20 DIAGNOSIS — L02419 Cutaneous abscess of limb, unspecified: Secondary | ICD-10-CM

## 2015-10-20 DIAGNOSIS — N182 Chronic kidney disease, stage 2 (mild): Secondary | ICD-10-CM | POA: Diagnosis not present

## 2015-10-20 DIAGNOSIS — L03116 Cellulitis of left lower limb: Secondary | ICD-10-CM | POA: Insufficient documentation

## 2015-10-20 DIAGNOSIS — L02416 Cutaneous abscess of left lower limb: Secondary | ICD-10-CM | POA: Insufficient documentation

## 2015-10-20 DIAGNOSIS — Z794 Long term (current) use of insulin: Secondary | ICD-10-CM | POA: Insufficient documentation

## 2015-10-20 DIAGNOSIS — M79662 Pain in left lower leg: Secondary | ICD-10-CM | POA: Diagnosis present

## 2015-10-20 DIAGNOSIS — L03119 Cellulitis of unspecified part of limb: Secondary | ICD-10-CM

## 2015-10-20 DIAGNOSIS — Z79899 Other long term (current) drug therapy: Secondary | ICD-10-CM | POA: Insufficient documentation

## 2015-10-20 MED ORDER — DOXYCYCLINE HYCLATE 100 MG PO TABS
100.0000 mg | ORAL_TABLET | Freq: Once | ORAL | Status: AC
  Administered 2015-10-20: 100 mg via ORAL
  Filled 2015-10-20: qty 1

## 2015-10-20 MED ORDER — DOXYCYCLINE HYCLATE 100 MG PO CAPS: 100.0000 mg | ORAL_CAPSULE | Freq: Two times a day (BID) | ORAL | 0 refills | Status: DC

## 2015-10-20 NOTE — ED Provider Notes (Signed)
[facility] DEPT Provider Note   CSN: [id number] Arrival date & time: 10/20/15  2046  By signing my name below, I, [name], attest that this documentation has been prepared under the direction and in the presence of Junius Creamer, NP. Electronically Signed: Gwenlyn [name], ED Scribe. 10/20/15. 9:43 PM.  First MD Initiated Contact with Patient 10/20/15 2139     History   Chief Complaint No chief complaint on file.  The history is provided by the patient. No language interpreter was used.    HPI Comments: [name] is a 30 y.o. female with PMHx of HTN, HLD, and DM who presents to the Emergency Department complaining of a wound and associated pain to the left inner lower leg s/p insect bite 1.5 weeks ago. She states she believes the wound is from a spider bite. Pt states she has been using salt water to treat the wound, but states the area has gradually worsened. Pt has been taking Tylenol for pain management. Pt states the area has been draining for about a week. Pt reports brown and bloody drainage. She had seen her PCP on 10/11/15 who directed her to go to the ED, but she states she was busy with work and could not go right away. She also saw her Renal MD, who did not prescribe antibiotics. PCP did not prescribe her antibiotics. Pt has PSHx of a kidney transplant and is immunosuppressed. Pt states she has not checked her temperature or blood pressure recently. Pt denies foot pain or chills. .   Past Medical History:  Diagnosis Date  . Chronic kidney disease   . Diabetes mellitus    Pt reports diagnosis in June 2011  . Hyperlipidemia   . Hypertension   . Kidney transplant recipient   . UTI (urinary tract infection) 01/09/2015    Patient Active Problem List   Diagnosis Date Noted  . Diabetes ([place]) 05/25/2015  . Acute UTI   . Emesis   . UTI (urinary tract infection) 01/09/2015  . Abdominal pain 01/09/2015  . Nausea and vomiting 01/09/2015  . Acute on chronic kidney failure  ([place]) 01/09/2015  . UTI (lower urinary tract infection) 01/09/2015  . Malnutrition of moderate degree 01/06/2015  . Renal failure (ARF), acute on chronic (HCC) 01/05/2015  . Fever, unspecified   . Renal transplant recipient   . Immunosuppressed status ([place])   . Hyponatremia   . Acute renal failure ([place]) 09/30/2014  . Sepsis ([place]) 06/24/2014  . Pyelonephritis 06/23/2014  . Dehydration 12/22/2012  . Chest pain 12/22/2012  . 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 CHRONIC KIDNEY DISEASE STAGE II (MILD) 01/04/2010  . OVARIAN FAILURE, PREMATURE 03/11/2009  . 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 SECONDARY HYPERPARATHYROIDISM 12/05/2007  . OBESITY 09/25/2007  . Anemia in chronic renal disease 09/25/2007  . LEARNING DISABILITY 09/25/2007  . Essential hypertension, benign 09/25/2007  . KIDNEY TRANSPLANTATION, HX OF 09/25/2007    Past Surgical History:  Procedure Laterality Date  . KIDNEY TRANSPLANT  2007  . RENAL BIOPSY Bilateral 2003  . THYROID LOBECTOMY      OB History    No data available       Home Medications    Prior to Admission medications   Medication Sig Start Date End Date Taking? Authorizing Provider  acetaminophen (TYLENOL) 500 MG tablet Take 1,000 mg by mouth every 6 (six) hours as needed for mild pain.    Historical Provider, MD  amoxicillin-clavulanate (AUGMENTIN) 875-125 MG tablet  Take 1 tablet by mouth every 12 (twelve) hours. 07/20/15   Hannah Muthersbaugh, PA-C  calcium carbonate (TUMS) 500 MG chewable tablet Chew 2 tablets by mouth 3 (three) times a week. Takes on Monday, wednesdays, and fridays    Historical Provider, MD  cetirizine (ZYRTEC) 10 MG tablet Take 10 mg by mouth daily.    Historical Provider, MD  diazepam (VALIUM) 5 MG tablet Take 1 tablet (5 mg total) by mouth every 6 (six) hours as needed for anxiety (spasms). 05/11/15   Elnora Morrison, MD  famotidine (PEPCID) 20 MG tablet Take 1 tablet (20 mg  total) by mouth daily. Patient taking differently: Take 20 mg by mouth daily. For heartburn and acid reflux 01/12/15   Nita Sells, MD  fluticasone (FLONASE) 50 MCG/ACT nasal spray Place 2 sprays into both nostrils daily. 07/20/15   Hannah Muthersbaugh, PA-C  glucose blood (BAYER CONTOUR NEXT TEST) test strip Check 4 times per day. DX CODE E11.9 06/13/15   Renato Shin, MD  guaiFENesin (MUCINEX) 600 MG 12 hr tablet Take 2 tablets (1,200 mg total) by mouth 2 (two) times daily. 07/20/15   Hannah Muthersbaugh, PA-C  hydrOXYzine (ATARAX) 25 MG tablet Take 25 mg by mouth 2 (two) times daily as needed for itching.     Historical Provider, MD  Insulin Degludec (TRESIBA FLEXTOUCH) 100 UNIT/ML SOPN Inject 90 Units into the skin daily. And pen needles 1/day 07/10/15   Renato Shin, MD  lisinopril-hydrochlorothiazide (PRINZIDE,ZESTORETIC) 20-25 MG tablet Take 1 tablet by mouth daily as needed (blood pressure).  11/14/14   Historical Provider, MD  loperamide (IMODIUM) 2 MG capsule Take 2 mg by mouth as needed for diarrhea or loose stools.     Historical Provider, MD  mycophenolate (MYFORTIC) 360 MG TBEC Take 720 mg by mouth 2 (two) times daily.     Historical Provider, MD  pantoprazole (PROTONIX) 20 MG tablet Take 1 tablet (20 mg total) by mouth daily. Patient taking differently: Take 20 mg by mouth daily. For heartburn and reflux 01/12/15   Nita Sells, MD  predniSONE (DELTASONE) 5 MG tablet Take 5 mg by mouth at bedtime.     Historical Provider, MD  promethazine (PHENERGAN) 12.5 MG tablet Take 1 tablet (12.5 mg total) by mouth every 6 (six) hours as needed for nausea or vomiting. 10/01/14   Ripudeep Krystal Eaton, MD  simvastatin (ZOCOR) 20 MG tablet Take 20 mg by mouth every evening.    Historical Provider, MD  tacrolimus (PROGRAF) 1 MG capsule Take 2 mg by mouth 2 (two) times daily.     Historical Provider, MD    Family History Family History  Problem Relation Age of Onset  . Arthritis Mother   .  Diabetes Mother   . Hypertension Mother     Social History Social History  Substance Use Topics  . Smoking status: Never Smoker  . Smokeless tobacco: Never Used  . Alcohol use No     Allergies   Motrin [ibuprofen]; Banana; Diphenhydramine hcl; Iron dextran; and Shellfish allergy   Review of Systems Review of Systems  Constitutional: Negative for chills and fever.  Skin: Positive for wound.  All other systems reviewed and are negative.    Physical Exam Updated Vital Signs BP 140/95 (BP Location: Left Arm)   Pulse 109   Temp 99.1 F (37.3 C) (Oral)   Resp 18   Ht 5\' 6"  (1.676 m)   Wt 173 lb (78.5 kg)   SpO2 100%   BMI 27.92 kg/m  Physical Exam  Constitutional: She is oriented to person, place, and time. She appears well-developed and well-nourished.  HENT:  Head: Normocephalic.  Eyes: Pupils are equal, round, and reactive to light.  Neck: Normal range of motion.  Cardiovascular: Normal rate.   Pulmonary/Chest: Effort normal.  Musculoskeletal: She exhibits tenderness. She exhibits no deformity.       Feet:  Neurological: She is alert and oriented to person, place, and time.  Skin: There is erythema.  Psychiatric: She has a normal mood and affect.  Nursing note and vitals reviewed.    ED Treatments / Results  DIAGNOSTIC STUDIES: Oxygen Saturation is 100% on RA, normal by my interpretation.    COORDINATION OF CARE: 9:43 PM Discussed treatment plan with pt at bedside which includes antibiotics and follow up  and pt agreed to plan.  Labs (all labs ordered are listed, but only abnormal results are displayed) Labs Reviewed - No data to display  EKG  EKG Interpretation None       Radiology No results found.  Procedures Procedures (including critical care time)  Medications Ordered in ED Medications - No data to display   Initial Impression / Assessment and Plan / ED Course  I have reviewed the triage vital signs and the nursing  notes.  Pertinent labs & imaging results that were available during my care of the patient were reviewed by me and considered in my medical decision making (see chart for details).  Clinical Course   Will state Doxcycline and have Pt follow up with PCP in 1 week she has been instructed to return to ED for increased swelling, drainage, erythema     Final Clinical Impressions(s) / ED Diagnoses   Final diagnoses:  None    New Prescriptions New Prescriptions   No medications on file   I personally performed the services described in this documentation, which was scribed in my presence. The recorded information has been reviewed and is accurate.    Junius Creamer, NP 10/20/15 2159    Junius Creamer, NP 10/20/15 2228    Junius Creamer, NP 10/21/15 2026    Julianne Rice, MD 10/23/15 1252

## 2015-10-20 NOTE — Discharge Instructions (Signed)
The area surrounding your wound has been outlined, if the area of redness extends past this please return If getting better with antibiotics please see your PCP in 1 week for re evaluation

## 2015-10-20 NOTE — ED Triage Notes (Signed)
Patient complaining of a wound and pain to the left inner lower leg. Patient thinks she got bit by a spider. Patient has reddened area around it. The wound is scab over and some of the wound has black areas. Patient is a diabetic.

## 2015-10-30 ENCOUNTER — Ambulatory Visit: Payer: Medicare Other | Admitting: Endocrinology

## 2015-10-30 ENCOUNTER — Inpatient Hospital Stay (HOSPITAL_COMMUNITY): Admission: RE | Admit: 2015-10-30 | Payer: Medicare Other | Source: Ambulatory Visit

## 2015-10-31 ENCOUNTER — Encounter (HOSPITAL_COMMUNITY)
Admission: RE | Admit: 2015-10-31 | Discharge: 2015-10-31 | Disposition: A | Discharge status: Home / Self Care | Payer: Medicare Other | Source: Ambulatory Visit | Attending: Nephrology | Admitting: Nephrology

## 2015-10-31 DIAGNOSIS — N182 Chronic kidney disease, stage 2 (mild): Secondary | ICD-10-CM

## 2015-10-31 DIAGNOSIS — D631 Anemia in chronic kidney disease: Secondary | ICD-10-CM | POA: Diagnosis not present

## 2015-10-31 DIAGNOSIS — N183 Chronic kidney disease, stage 3 (moderate): Secondary | ICD-10-CM | POA: Diagnosis not present

## 2015-10-31 LAB — POCT HEMOGLOBIN-HEMACUE: HEMOGLOBIN: 10.9 g/dL — AB (ref 12.0–15.0)

## 2015-10-31 MED ORDER — EPOETIN ALFA 40000 UNIT/ML IJ SOLN: 30000.0000 [IU] | INTRAMUSCULAR | Status: DC

## 2015-10-31 MED ORDER — EPOETIN ALFA 20000 UNIT/ML IJ SOLN
INTRAMUSCULAR | Status: AC
  Administered 2015-10-31: 20000 [IU]
  Filled 2015-10-31: qty 1

## 2015-10-31 MED ORDER — EPOETIN ALFA 10000 UNIT/ML IJ SOLN
INTRAMUSCULAR | Status: AC
  Administered 2015-10-31: 10000 [IU]
  Filled 2015-10-31: qty 1

## 2015-11-13 ENCOUNTER — Encounter (HOSPITAL_COMMUNITY)
Admission: RE | Admit: 2015-11-13 | Discharge: 2015-11-13 | Disposition: A | Discharge status: Home / Self Care | Payer: Medicare Other | Source: Ambulatory Visit | Attending: Nephrology | Admitting: Nephrology

## 2015-11-13 DIAGNOSIS — N182 Chronic kidney disease, stage 2 (mild): Secondary | ICD-10-CM

## 2015-11-13 DIAGNOSIS — D631 Anemia in chronic kidney disease: Secondary | ICD-10-CM | POA: Diagnosis not present

## 2015-11-13 DIAGNOSIS — N183 Chronic kidney disease, stage 3 (moderate): Secondary | ICD-10-CM | POA: Diagnosis not present

## 2015-11-13 LAB — IRON AND TIBC
Iron: 40 ug/dL (ref 28–170)
SATURATION RATIOS: 18 % (ref 10.4–31.8)
TIBC: 217 ug/dL — ABNORMAL LOW (ref 250–450)
UIBC: 177 ug/dL

## 2015-11-13 LAB — FERRITIN: Ferritin: 1302 ng/mL — ABNORMAL HIGH (ref 11–307)

## 2015-11-13 LAB — POCT HEMOGLOBIN-HEMACUE: HEMOGLOBIN: 10.8 g/dL — AB (ref 12.0–15.0)

## 2015-11-13 MED ORDER — EPOETIN ALFA 10000 UNIT/ML IJ SOLN
INTRAMUSCULAR | Status: AC
  Administered 2015-11-13: 10000 [IU]
  Filled 2015-11-13: qty 1

## 2015-11-13 MED ORDER — EPOETIN ALFA 20000 UNIT/ML IJ SOLN
INTRAMUSCULAR | Status: AC
  Administered 2015-11-13: 20000 [IU]
  Filled 2015-11-13: qty 1

## 2015-11-13 MED ORDER — EPOETIN ALFA 40000 UNIT/ML IJ SOLN: 30000.0000 [IU] | INTRAMUSCULAR | Status: DC

## 2015-11-20 ENCOUNTER — Ambulatory Visit (INDEPENDENT_AMBULATORY_CARE_PROVIDER_SITE_OTHER): Payer: Medicare Other | Admitting: Endocrinology

## 2015-11-20 ENCOUNTER — Encounter: Payer: Self-pay | Admitting: Endocrinology

## 2015-11-20 VITALS — BP 122/80 | HR 94 | Ht 66.0 in | Wt 178.0 lb

## 2015-11-20 DIAGNOSIS — E1022 Type 1 diabetes mellitus with diabetic chronic kidney disease: Secondary | ICD-10-CM

## 2015-11-20 DIAGNOSIS — D649 Anemia, unspecified: Secondary | ICD-10-CM | POA: Diagnosis not present

## 2015-11-20 DIAGNOSIS — I10 Essential (primary) hypertension: Secondary | ICD-10-CM | POA: Diagnosis not present

## 2015-11-20 DIAGNOSIS — N185 Chronic kidney disease, stage 5: Secondary | ICD-10-CM | POA: Diagnosis not present

## 2015-11-20 DIAGNOSIS — E139 Other specified diabetes mellitus without complications: Secondary | ICD-10-CM | POA: Diagnosis not present

## 2015-11-20 DIAGNOSIS — Z6829 Body mass index (BMI) 29.0-29.9, adult: Secondary | ICD-10-CM | POA: Diagnosis not present

## 2015-11-20 DIAGNOSIS — E785 Hyperlipidemia, unspecified: Secondary | ICD-10-CM | POA: Diagnosis not present

## 2015-11-20 DIAGNOSIS — S81802A Unspecified open wound, left lower leg, initial encounter: Secondary | ICD-10-CM | POA: Diagnosis not present

## 2015-11-20 DIAGNOSIS — Z940 Kidney transplant status: Secondary | ICD-10-CM | POA: Diagnosis not present

## 2015-11-20 LAB — POCT GLYCOSYLATED HEMOGLOBIN (HGB A1C): Hemoglobin A1C: 11.8

## 2015-11-20 MED ORDER — INSULIN DEGLUDEC 100 UNIT/ML ~~LOC~~ SOPN: 120.0000 [IU] | PEN_INJECTOR | Freq: Every day | SUBCUTANEOUS | 11 refills | Status: DC

## 2015-11-20 NOTE — Progress Notes (Signed)
Subjective:    Patient ID: [name], female    DOB: 04/11/1985, 30 y.o.   MRN: [id number]  HPI Pt returns for f/u of diabetes mellitus: DM type: 1 Dx'ed: 2012, when she was on prednisone Complications: polyneuropathy and renal failure (transplant in 2007).   Therapy: insulin since soon after dx.   GDM: (G0).   DKA: several episodes, most recently in mid-2016.  Severe hypoglycemia: never.   Pancreatitis: never.  Other: she took pump rx during 2016, but was changed to qd insulin, due to poor results; she is chronically on prednisone; pt has had TAH (at prior ov's here, she has reported being at risk for pregnancy, even after TAH).   Interval history: no cbg record, but states cbg's vary from 100-300's.  There is no trend throughout the day. pt states she feels well in general.  She says she still sometimes misses the insulin.  Past Medical History:  Diagnosis Date  . Chronic kidney disease   . Diabetes mellitus    Pt reports diagnosis in June 2011  . Hyperlipidemia   . Hypertension   . Kidney transplant recipient   . UTI (urinary tract infection) 01/09/2015    Past Surgical History:  Procedure Laterality Date  . KIDNEY TRANSPLANT  2007  . RENAL BIOPSY Bilateral 2003  . THYROID LOBECTOMY      Social History   Social History  . Marital status: Single    Spouse name: N/A  . Number of children: N/A  . Years of education: N/A   Occupational History  . Not on file.   Social History Main Topics  . Smoking status: Never Smoker  . Smokeless tobacco: Never Used  . Alcohol use No  . Drug use: No  . Sexual activity: Yes    Birth control/ protection: None   Other Topics Concern  . Not on file   Social History Narrative  . No narrative on file    Current Outpatient Prescriptions on File Prior to Visit  Medication Sig Dispense Refill  . acetaminophen (TYLENOL) 500 MG tablet Take 1,000-1,500 mg by mouth every 6 (six) hours as needed for mild pain.     . calcium  carbonate (TUMS) 500 MG chewable tablet Chew 2 tablets by mouth 3 (three) times daily as needed for indigestion. Takes on Monday, wednesdays, and fridays    . cetirizine (ZYRTEC) 10 MG tablet Take 10 mg by mouth daily.    . diazepam (VALIUM) 5 MG tablet Take 1 tablet (5 mg total) by mouth every 6 (six) hours as needed for anxiety (spasms). 6 tablet 0  . famotidine (PEPCID) 20 MG tablet Take 1 tablet (20 mg total) by mouth daily. (Patient taking differently: Take 20 mg by mouth daily. For heartburn and acid reflux) 30 tablet 0  . fluticasone (FLONASE) 50 MCG/ACT nasal spray Place 2 sprays into both nostrils daily. 9.9 g 2  . glucose blood (BAYER CONTOUR NEXT TEST) test strip Check 4 times per day. DX CODE E11.9 300 each 12  . guaiFENesin (MUCINEX) 600 MG 12 hr tablet Take 2 tablets (1,200 mg total) by mouth 2 (two) times daily. 20 tablet 0  . hydrOXYzine (ATARAX) 25 MG tablet Take 25 mg by mouth 2 (two) times daily as needed for itching.     Marland Kitchen lisinopril-hydrochlorothiazide (PRINZIDE,ZESTORETIC) 20-25 MG tablet Take 1 tablet by mouth daily as needed (blood pressure).   5  . loperamide (IMODIUM) 2 MG capsule Take 2 mg by mouth as needed  for diarrhea or loose stools.     . mycophenolate (MYFORTIC) 360 MG TBEC Take 720 mg by mouth 2 (two) times daily.     . pantoprazole (PROTONIX) 20 MG tablet Take 1 tablet (20 mg total) by mouth daily. 30 tablet 0  . predniSONE (DELTASONE) 5 MG tablet Take 5 mg by mouth at bedtime.     . promethazine (PHENERGAN) 12.5 MG tablet Take 1 tablet (12.5 mg total) by mouth every 6 (six) hours as needed for nausea or vomiting. 30 tablet 0  . simvastatin (ZOCOR) 20 MG tablet Take 20 mg by mouth 3 (three) times a week. MWF.    Marland Kitchen tacrolimus (PROGRAF) 1 MG capsule Take 2 mg by mouth 2 (two) times daily.     Marland Kitchen amoxicillin-clavulanate (AUGMENTIN) 875-125 MG tablet Take 1 tablet by mouth every 12 (twelve) hours. (Patient not taking: Reported on 11/20/2015) 14 tablet 0  . doxycycline  (VIBRAMYCIN) 100 MG capsule Take 1 capsule (100 mg total) by mouth 2 (two) times daily. (Patient not taking: Reported on 11/20/2015) 20 capsule 0   No current facility-administered medications on file prior to visit.     Allergies  Allergen Reactions  . Motrin [Ibuprofen] Shortness Of Breath and Itching    Per pt  . Banana Other (See Comments)    Sick on the stomach  . Diphenhydramine Hcl     REACTION: Stopped breathing in ICU  . Doxycycline     Shortness of Breath   . Iron Dextran     REACTION: vein irritation  . Shellfish Allergy Hives    Family History  Problem Relation Age of Onset  . Arthritis Mother   . Diabetes Mother   . Hypertension Mother    BP 122/80   Pulse 94   Ht 5\' 6"  (1.676 m)   Wt 178 lb (80.7 kg)   SpO2 98%   BMI 28.73 kg/m   Review of Systems She denies hypoglycemia.     Objective:   Physical Exam VITAL SIGNS:  See vs page GENERAL: no distress Pulses: dorsalis pedis intact bilat.   MSK: no deformity of the feet CV: no leg edema Skin:  no ulcer on the feet.  normal color and temp on the feet. Neuro: sensation is intact to touch on the feet.  A1c=11.8%     Assessment & Plan:  Type 1 DM: ongoing poor control Noncompliance with cbg recording and insulin, persistent: I'll work around this as best I can.

## 2015-11-20 NOTE — Patient Instructions (Addendum)
Please increase the tresiba to 120 units daily.  If you miss a shot, take it as soon as you remember.   On this type of insulin schedule, you should eat meals on a regular schedule.  If a meal is missed or significantly delayed, your blood sugar could go low.   Please come back for a follow-up appointment in 2 months.   check your blood sugar 4 times a day: before the 3 meals, and at bedtime.  also check if you have symptoms of your blood sugar being too high or too low.  please keep a record of the readings and bring it to your next appointment here.  You can write it on any piece of paper.  please call us sooner if your blood sugar goes below 70, or if you have a lot of readings over 200.

## 2015-11-27 ENCOUNTER — Encounter (HOSPITAL_COMMUNITY)
Admission: RE | Admit: 2015-11-27 | Discharge: 2015-11-27 | Disposition: A | Discharge status: Home / Self Care | Payer: Medicare Other | Source: Ambulatory Visit | Attending: Nephrology | Admitting: Nephrology

## 2015-11-27 ENCOUNTER — Encounter: Payer: Medicare Other | Attending: Surgery | Admitting: Surgery

## 2015-11-27 DIAGNOSIS — K219 Gastro-esophageal reflux disease without esophagitis: Secondary | ICD-10-CM | POA: Diagnosis not present

## 2015-11-27 DIAGNOSIS — N183 Chronic kidney disease, stage 3 (moderate): Secondary | ICD-10-CM | POA: Diagnosis not present

## 2015-11-27 DIAGNOSIS — I739 Peripheral vascular disease, unspecified: Secondary | ICD-10-CM | POA: Insufficient documentation

## 2015-11-27 DIAGNOSIS — G40909 Epilepsy, unspecified, not intractable, without status epilepticus: Secondary | ICD-10-CM | POA: Insufficient documentation

## 2015-11-27 DIAGNOSIS — L97222 Non-pressure chronic ulcer of left calf with fat layer exposed: Secondary | ICD-10-CM | POA: Insufficient documentation

## 2015-11-27 DIAGNOSIS — F329 Major depressive disorder, single episode, unspecified: Secondary | ICD-10-CM | POA: Diagnosis not present

## 2015-11-27 DIAGNOSIS — I890 Lymphedema, not elsewhere classified: Secondary | ICD-10-CM | POA: Insufficient documentation

## 2015-11-27 DIAGNOSIS — Z940 Kidney transplant status: Secondary | ICD-10-CM | POA: Diagnosis not present

## 2015-11-27 DIAGNOSIS — D649 Anemia, unspecified: Secondary | ICD-10-CM | POA: Insufficient documentation

## 2015-11-27 DIAGNOSIS — L97821 Non-pressure chronic ulcer of other part of left lower leg limited to breakdown of skin: Secondary | ICD-10-CM | POA: Diagnosis not present

## 2015-11-27 DIAGNOSIS — E13622 Other specified diabetes mellitus with other skin ulcer: Secondary | ICD-10-CM | POA: Diagnosis not present

## 2015-11-27 DIAGNOSIS — F419 Anxiety disorder, unspecified: Secondary | ICD-10-CM | POA: Insufficient documentation

## 2015-11-27 DIAGNOSIS — D631 Anemia in chronic kidney disease: Secondary | ICD-10-CM | POA: Diagnosis not present

## 2015-11-27 DIAGNOSIS — M1990 Unspecified osteoarthritis, unspecified site: Secondary | ICD-10-CM | POA: Insufficient documentation

## 2015-11-27 DIAGNOSIS — E11622 Type 2 diabetes mellitus with other skin ulcer: Secondary | ICD-10-CM | POA: Insufficient documentation

## 2015-11-27 DIAGNOSIS — I10 Essential (primary) hypertension: Secondary | ICD-10-CM | POA: Diagnosis not present

## 2015-11-27 DIAGNOSIS — E785 Hyperlipidemia, unspecified: Secondary | ICD-10-CM | POA: Insufficient documentation

## 2015-11-27 DIAGNOSIS — N182 Chronic kidney disease, stage 2 (mild): Secondary | ICD-10-CM

## 2015-11-27 DIAGNOSIS — Z794 Long term (current) use of insulin: Secondary | ICD-10-CM | POA: Diagnosis not present

## 2015-11-27 LAB — POCT HEMOGLOBIN-HEMACUE: Hemoglobin: 10.2 g/dL — ABNORMAL LOW (ref 12.0–15.0)

## 2015-11-27 MED ORDER — EPOETIN ALFA 10000 UNIT/ML IJ SOLN
INTRAMUSCULAR | Status: AC
  Administered 2015-11-27: 10000 [IU] via SUBCUTANEOUS
  Filled 2015-11-27: qty 1

## 2015-11-27 MED ORDER — EPOETIN ALFA 20000 UNIT/ML IJ SOLN
INTRAMUSCULAR | Status: AC
  Administered 2015-11-27: 20000 [IU] via SUBCUTANEOUS
  Filled 2015-11-27: qty 1

## 2015-11-27 MED ORDER — EPOETIN ALFA 40000 UNIT/ML IJ SOLN: 30000.0000 [IU] | INTRAMUSCULAR | Status: DC

## 2015-11-29 NOTE — Progress Notes (Signed)
FRAIDY, MCCARRICK ([id number]) Visit Report for 11/27/2015 Abuse/Suicide Risk Screen Details Patient Name: [name], [name] Date of Service: 11/27/2015 9:30 AM Medical Record Number: [id number] Patient Account Number: [id number] Date of Birth/Sex: January 25, 1986 (30 y.o. Female) Treating RN: Carolyne Fiscal, Debi Primary Care Physician: [name] Other Clinician: Referring Physician: Jamal Maes Treating Physician/Extender: Frann Rider in Treatment: 0 Abuse/Suicide Risk Screen Items Answer ABUSE/SUICIDE RISK SCREEN: Has anyone close to you tried to hurt or harm you recentlyo No Do you feel uncomfortable with anyone in your familyo No Has anyone forced you do things that you didnot want to doo No Do you have any thoughts of harming yourselfo No Patient displays signs or symptoms of abuse and/or neglect. No Electronic Signature(s) Signed: 11/27/2015 12:49:55 PM By: Regan Lemming BSN, RN Signed: 11/28/2015 4:43:58 PM By: Alric Quan Entered By: Regan Lemming on 11/27/2015 09:57:52 [name] ([id number]) -------------------------------------------------------------------------------- Activities of Daily Living Details Patient Name: [name] Date of Service: 11/27/2015 9:30 AM Medical Record Number: [id number] Patient Account Number: [id number] Date of Birth/Sex: 02/20/1986 (30 y.o. Female) Treating RN: Carolyne Fiscal, Debi Primary Care Physician: [name] Other Clinician: Referring Physician: Jamal Maes Treating Physician/Extender: Frann Rider in Treatment: 0 Activities of Daily Living Items Answer Activities of Daily Living (Please select one for each item) Drive Automobile Completely Able Take Medications Completely Able Use Telephone Completely Able Care for Appearance Completely Able Use Toilet Completely Able Bath / Shower Completely Able Dress Self Completely Able Feed Self Completely Able Walk Completely Able Get In / Out Bed Completely  Able Housework Completely Able Prepare Meals Completely Able Handle Money Completely Able Shop for Self Completely Able Electronic Signature(s) Signed: 11/27/2015 12:49:55 PM By: Regan Lemming BSN, RN Signed: 11/28/2015 4:43:58 PM By: Alric Quan Entered By: Regan Lemming on 11/27/2015 09:57:42 [name] ([id number]) -------------------------------------------------------------------------------- Education Assessment Details Patient Name: [name] Date of Service: 11/27/2015 9:30 AM Medical Record Number: [id number] Patient Account Number: [id number] Date of Birth/Sex: 1985/08/03 (30 y.o. Female) Treating RN: Carolyne Fiscal, Debi Primary Care Physician: [name] Other Clinician: Referring Physician: Jamal Maes Treating Physician/Extender: Frann Rider in Treatment: 0 Primary Learner Assessed: Patient Learning Preferences/Education Level/Primary Language Learning Preference: Explanation, Printed Material Highest Education Level: College or Above Preferred Language: English Cognitive Barrier Assessment/Beliefs Language Barrier: No Translator Needed: No Memory Deficit: No Emotional Barrier: No Cultural/Religious Beliefs Affecting Medical No Care: Physical Barrier Assessment Impaired Vision: No Impaired Hearing: No Decreased Hand dexterity: No Knowledge/Comprehension Assessment Knowledge Level: High Comprehension Level: High Ability to understand written High instructions: Ability to understand verbal High instructions: Motivation Assessment Anxiety Level: Calm Cooperation: Cooperative Education Importance: Acknowledges Need Interest in Health Problems: Asks Questions Perception: Coherent Willingness to Engage in Self- High Management Activities: Readiness to Engage in Self- High Management Activities: Electronic Signature(s) JANEQUA, KIPNIS ([id number]) Signed: 11/27/2015 12:49:55 PM By: Regan Lemming BSN, RN Signed: 11/28/2015 4:43:58 PM  By: Alric Quan Entered By: Regan Lemming on 11/27/2015 09:57:35 [name] ([id number]) -------------------------------------------------------------------------------- Fall Risk Assessment Details Patient Name: [name] Date of Service: 11/27/2015 9:30 AM Medical Record Number: [id number] Patient Account Number: [id number] Date of Birth/Sex: 1986-02-11 (30 y.o. Female) Treating RN: Carolyne Fiscal, Debi Primary Care Physician: [name] Other Clinician: Referring Physician: Jamal Maes Treating Physician/Extender: Frann Rider in Treatment: 0 Fall Risk Assessment Items Have you had 2 or more falls in the last 12 monthso 0 No Have you had any fall that resulted in injury in the last 12 monthso 0 No  FALL RISK ASSESSMENT: History of falling - immediate or within 3 months 0 No Secondary diagnosis 0 No Ambulatory aid None/bed rest/wheelchair/nurse 0 No Crutches/cane/walker 0 No Furniture 0 No IV Access/Saline Lock 0 No Gait/Training Normal/bed rest/immobile 0 No Weak 0 No Impaired 0 No Mental Status Oriented to own ability 0 Yes Electronic Signature(s) Signed: 11/27/2015 12:49:55 PM By: Regan Lemming BSN, RN Signed: 11/28/2015 4:43:58 PM By: Alric Quan Entered By: Regan Lemming on 11/27/2015 09:57:25 [name] ([id number]) -------------------------------------------------------------------------------- Foot Assessment Details Patient Name: [name] Date of Service: 11/27/2015 9:30 AM Medical Record Number: [id number] Patient Account Number: [id number] Date of Birth/Sex: December 07, 1985 (30 y.o. Female) Treating RN: Ahmed Prima Primary Care Physician: [name] Other Clinician: Referring Physician: Jamal Maes Treating Physician/Extender: Frann Rider in Treatment: 0 Foot Assessment Items Site Locations + = Sensation present, - = Sensation absent, C = Callus, U = Ulcer R = Redness, W = Warmth, M = Maceration, PU =  Pre-ulcerative lesion F = Fissure, S = Swelling, D = Dryness Assessment Right: Left: Other Deformity: No No Prior Foot Ulcer: No No Prior Amputation: No No Charcot Joint: No No Ambulatory Status: Ambulatory Without Help Gait: Steady Electronic Signature(s) Signed: 11/27/2015 12:49:55 PM By: Regan Lemming BSN, RN Signed: 11/28/2015 4:43:58 PM By: Alric Quan Entered By: Regan Lemming on 11/27/2015 09:57:13 [name] ([id number]) -------------------------------------------------------------------------------- Nutrition Risk Assessment Details Patient Name: [name] Date of Service: 11/27/2015 9:30 AM Medical Record Number: [id number] Patient Account Number: [id number] Date of Birth/Sex: 1985/10/08 (30 y.o. Female) Treating RN: Carolyne Fiscal, Debi Primary Care Physician: [name] Other Clinician: Referring Physician: Jamal Maes Treating Physician/Extender: Frann Rider in Treatment: 0 Height (in): 66 Weight (lbs): 180 Body Mass Index (BMI): 29 Nutrition Risk Assessment Items NUTRITION RISK SCREEN: I have an illness or condition that made me change the kind and/or 0 No amount of food I eat I eat fewer than two meals per day 3 Yes I eat few fruits and vegetables, or milk products 0 No I have three or more drinks of beer, liquor or wine almost every day 0 No I have tooth or mouth problems that make it hard for me to eat 0 No I don't always have enough money to buy the food I need 0 No I eat alone most of the time 0 No I take three or more different prescribed or over-the-counter drugs a 1 Yes day Without wanting to, I have lost or gained 10 pounds in the last six 0 No months I am not always physically able to shop, [place] and/or feed myself 0 No Nutrition Protocols Good Risk Protocol Moderate Risk Protocol Electronic Signature(s) Signed: 11/27/2015 12:49:55 PM By: Regan Lemming BSN, RN Signed: 11/28/2015 4:43:58 PM By: Alric Quan Entered By:  Regan Lemming on 11/27/2015 09:54:19

## 2015-11-29 NOTE — Progress Notes (Signed)
[name], [name] ([id number]) Visit Report for 11/27/2015 Allergy List Details Patient Name: [name], [name] Date of Service: 11/27/2015 9:30 AM Medical Record Number: [id number] Patient Account Number: [id number] Date of Birth/Sex: 03/13/1986 (30 y.o. Female) Treating RN: Ahmed Prima Primary Care Physician: [name] Other Clinician: Referring Physician: Jamal Maes Treating Physician/Extender: Frann Rider in Treatment: 0 Allergies Active Allergies shellfish Benadryl Reaction: anaphalaxis Severity: Severe banana nitrofurantoin Infed Allergy Notes Electronic Signature(s) Signed: 11/27/2015 12:49:55 PM By: Regan Lemming BSN, RN Entered By: Regan Lemming on 11/27/2015 09:58:16 [name] ([id number]) -------------------------------------------------------------------------------- [place] Details Patient Name: [name] Date of Service: 11/27/2015 9:30 AM Medical Record Number: [id number] Patient Account Number: [id number] Date of Birth/Sex: 12-Mar-1986 (30 y.o. Female) Treating RN: Carolyne Fiscal, Debi Primary Care Physician: [name] Other Clinician: Referring Physician: Jamal Maes Treating Physician/Extender: Frann Rider in Treatment: 0 Visit Information Patient Arrived: Ambulatory Arrival Time: 09:34 Accompanied By: self Transfer Assistance: None Patient Identification Verified: Yes Secondary Verification Process Yes Completed: Patient Requires Transmission-Based No Precautions: Patient Has Alerts: Yes Patient Alerts: DM II Electronic Signature(s) Signed: 11/27/2015 12:49:55 PM By: Regan Lemming BSN, RN Entered By: Regan Lemming on 11/27/2015 09:58:44 [name] ([id number]) -------------------------------------------------------------------------------- Clinic Level of Care Assessment Details Patient Name: [name] Date of Service: 11/27/2015 9:30 AM Medical Record Number: [id number] Patient Account Number:  [id number] Date of Birth/Sex: 1985-03-19 (30 y.o. Female) Treating RN: Afful, RN, BSN, Velva Harman Primary Care Physician: [name] Other Clinician: Referring Physician: Jamal Maes Treating Physician/Extender: Frann Rider in Treatment: 0 Clinic Level of Care Assessment Items TOOL 2 Quantity Score []  - Use when only an EandM is performed on the INITIAL visit 0 ASSESSMENTS - Nursing Assessment / Reassessment X - General Physical Exam (combine w/ comprehensive assessment (listed just 1 20 below) when performed on new pt. evals) X - Comprehensive Assessment (HX, ROS, Risk Assessments, Wounds Hx, etc.) 1 25 ASSESSMENTS - Wound and Skin Assessment / Reassessment X - Simple Wound Assessment / Reassessment - one wound 1 5 []  - Complex Wound Assessment / Reassessment - multiple wounds 0 []  - Dermatologic / Skin Assessment (not related to wound area) 0 ASSESSMENTS - Ostomy and/or Continence Assessment and Care []  - Incontinence Assessment and Management 0 []  - Ostomy Care Assessment and Management (repouching, etc.) 0 PROCESS - Coordination of Care X - Simple Patient / Family Education for ongoing care 1 15 []  - Complex (extensive) Patient / Family Education for ongoing care 0 []  - Staff obtains Programmer, systems, Records, Test Results / Process Orders 0 []  - Staff telephones HHA, Nursing Homes / Clarify orders / etc 0 []  - Routine Transfer to another Facility (non-emergent condition) 0 []  - Routine Hospital Admission (non-emergent condition) 0 []  - New Admissions / Biomedical engineer / Ordering NPWT, Apligraf, etc. 0 []  - Emergency Hospital Admission (emergent condition) 0 []  - Simple Discharge Coordination 0 [name], BURNSWORTH. ([id number]) []  - Complex (extensive) Discharge Coordination 0 PROCESS - Special Needs []  - Pediatric / Minor Patient Management 0 []  - Isolation Patient Management 0 []  - Hearing / Language / Visual special needs 0 []  - Assessment of Community assistance  (transportation, D/C planning, etc.) 0 []  - Additional assistance / Altered mentation 0 []  - Support Surface(s) Assessment (bed, cushion, seat, etc.) 0 INTERVENTIONS - Wound Cleansing / Measurement X - Wound Imaging (photographs - any number of wounds) 1 5 X - Wound Tracing (instead of photographs) 1 5 X - Simple Wound Measurement - one wound  1 5 []  - Complex Wound Measurement - multiple wounds 0 X - Simple Wound Cleansing - one wound 1 5 []  - Complex Wound Cleansing - multiple wounds 0 INTERVENTIONS - Wound Dressings X - Small Wound Dressing one or multiple wounds 1 10 []  - Medium Wound Dressing one or multiple wounds 0 []  - Large Wound Dressing one or multiple wounds 0 []  - Application of Medications - injection 0 INTERVENTIONS - Miscellaneous []  - External ear exam 0 []  - Specimen Collection (cultures, biopsies, blood, body fluids, etc.) 0 []  - Specimen(s) / Culture(s) sent or taken to Lab for analysis 0 []  - Patient Transfer (multiple staff / Harrel Lemon Lift / Similar devices) 0 []  - Simple Staple / Suture removal (25 or less) 0 []  - Complex Staple / Suture removal (26 or more) 0 [name], Valerie S. ([id number]) []  - Hypo / Hyperglycemic Management (close monitor of Blood Glucose) 0 X - Ankle / Brachial Index (ABI) - do not check if billed separately 1 15 Has the patient been seen at the hospital within the last three years: Yes Total Score: 110 Level Of Care: New/Established - Level 3 Electronic Signature(s) Signed: 11/27/2015 12:49:55 PM By: Regan Lemming BSN, RN Entered By: Regan Lemming on 11/27/2015 11:40:48 [name] ([id number]) -------------------------------------------------------------------------------- Encounter Discharge Information Details Patient Name: [name] Date of Service: 11/27/2015 9:30 AM Medical Record Number: [id number] Patient Account Number: [id number] Date of Birth/Sex: 05-26-1985 (30 y.o. Female) Treating RN: Carolyne Fiscal, Debi Primary Care Physician:  [name] Other Clinician: Referring Physician: Jamal Maes Treating Physician/Extender: Frann Rider in Treatment: 0 Encounter Discharge Information Items Discharge Pain Level: 6 Discharge Condition: Stable Ambulatory Status: Ambulatory Discharge Destination: Home Transportation: Private Auto Accompanied By: self Schedule Follow-up Appointment: Yes Medication Reconciliation completed and provided to Patient/Care No Decklyn Hornik: Provided on Clinical Summary of Care: 11/27/2015 Form Type Recipient Paper Patient DC Electronic Signature(s) Signed: 11/27/2015 10:41:53 AM By: Ruthine Dose Entered By: Ruthine Dose on 11/27/2015 10:41:52 [name] ([id number]) -------------------------------------------------------------------------------- General Visit Notes Details Patient Name: [name] Date of Service: 11/27/2015 9:30 AM Medical Record Number: [id number] Patient Account Number: [id number] Date of Birth/Sex: 1985-12-28 (30 y.o. Female) Treating RN: Carolyne Fiscal, Debi Primary Care Physician: [name] Other Clinician: Referring Physician: Jamal Maes Treating Physician/Extender: Frann Rider in Treatment: 0 Notes During the admission visit the pt stated that she did like to have her picture taken. I told her that it was just for her chart to make sure we had the right pt and no one would see it but the people associated with her case. The pt stated that she just did not want her picture taken of her face. Electronic Signature(s) Signed: 11/27/2015 10:55:18 AM By: Alric Quan Entered By: Alric Quan on 11/27/2015 10:55:17 [name] ([id number]) -------------------------------------------------------------------------------- Lower Extremity Assessment Details Patient Name: [name] Date of Service: 11/27/2015 9:30 AM Medical Record Number: [id number] Patient Account Number: [id number] Date of Birth/Sex: 01/26/1986 (30  y.o. Female) Treating RN: Carolyne Fiscal, Debi Primary Care Physician: [name] Other Clinician: Referring Physician: Jamal Maes Treating Physician/Extender: Frann Rider in Treatment: 0 Edema Assessment Assessed: [Left: No] [Right: No] E[Left: dema] [Right: :] Calf Left: Right: Point of Measurement: 30 cm From Medial Instep 36.5 cm cm Ankle Left: Right: Point of Measurement: 9 cm From Medial Instep 23.2 cm cm Vascular Assessment Pulses: Posterior Tibial Dorsalis Pedis Palpable: [Left:Yes] Doppler: [Left:Multiphasic] Extremity colors, hair growth, and conditions: Extremity Color: [Left:Hyperpigmented] Temperature of Extremity: [Left:Warm] Capillary Refill: [  Left:< 3 seconds] Blood Pressure: Brachial: [Left:110] Dorsalis Pedis: 160 [Left:Dorsalis Pedis:] Ankle: Posterior Tibial: 170 [Left:Posterior Tibial: 1.55] Toe Nail Assessment Left: Right: Thick: No Discolored: No Deformed: No Improper Length and Hygiene: Yes Notes heel to knee measurement 44.5 cm [name], [name] ([id number]) Electronic Signature(s) Signed: 11/28/2015 4:43:58 PM By: Alric Quan Entered By: Alric Quan on 11/27/2015 10:36:58 [name] ([id number]) -------------------------------------------------------------------------------- Multi Wound Chart Details Patient Name: [name] Date of Service: 11/27/2015 9:30 AM Medical Record Number: [id number] Patient Account Number: [id number] Date of Birth/Sex: 03-24-1985 (30 y.o. Female) Treating RN: Baruch Gouty, RN, BSN, Velva Harman Primary Care Physician: [name] Other Clinician: Referring Physician: Jamal Maes Treating Physician/Extender: Frann Rider in Treatment: 0 Vital Signs Height(in): 66 Pulse(bpm): 93 Weight(lbs): 180 Blood Pressure 130/96 (mmHg): Body Mass Index(BMI): 29 Temperature(F): 98.4 Respiratory Rate 20 (breaths/min): Photos: [1:No Photos] [N/A:N/A] Wound Location: [1:Left Lower Leg -  Medial] [N/A:N/A] Wounding Event: [1:Bite] [N/A:N/A] Primary Etiology: [1:Diabetic Wound/Ulcer of the Lower Extremity] [N/A:N/A] Comorbid History: [1:Anemia, Hypertension, Type II Diabetes, Osteoarthritis, Seizure Disorder] [N/A:N/A] Date Acquired: [1:10/17/2015] [N/A:N/A] Weeks of Treatment: [1:0] [N/A:N/A] Wound Status: [1:Open] [N/A:N/A] Measurements L x W x D 0.9x1.4x0.1 [N/A:N/A] (cm) Area (cm) : [1:0.99] [N/A:N/A] Volume (cm) : [1:0.099] [N/A:N/A] % Reduction in Area: [1:0.00%] [N/A:N/A] % Reduction in Volume: 0.00% [N/A:N/A] Classification: [1:Grade 1] [N/A:N/A] Exudate Amount: [1:Large] [N/A:N/A] Exudate Type: [1:Serous] [N/A:N/A] Exudate Color: [1:amber] [N/A:N/A] Wound Margin: [1:Distinct, outline attached] [N/A:N/A] Granulation Amount: [1:Medium (34-66%)] [N/A:N/A] Granulation Quality: [1:Pink] [N/A:N/A] Necrotic Amount: [1:Medium (34-66%)] [N/A:N/A] Exposed Structures: [1:Fascia: No Fat: No Tendon: No Muscle: No] [N/A:N/A] Joint: No Bone: No Limited to Skin Breakdown Epithelialization: None N/A N/A Periwound Skin Texture: Edema: Yes N/A N/A Periwound Skin Moist: Yes N/A N/A Moisture: Periwound Skin Color: No Abnormalities Noted N/A N/A Temperature: No Abnormality N/A N/A Tenderness on Yes N/A N/A Palpation: Wound Preparation: Ulcer Cleansing: N/A N/A Rinsed/Irrigated with Saline Topical Anesthetic Applied: Other: lidocaine 4% Treatment Notes Electronic Signature(s) Signed: 11/27/2015 12:49:55 PM By: Regan Lemming BSN, RN Entered By: Regan Lemming on 11/27/2015 10:26:06 [name] ([id number]) -------------------------------------------------------------------------------- [place] Details Patient Name: [name] Date of Service: 11/27/2015 9:30 AM Medical Record Number: [id number] Patient Account Number: [id number] Date of Birth/Sex: August 31, 1985 (30 y.o. Female) Treating RN: Afful, RN, BSN, Velva Harman Primary Care Physician: [name] Other Clinician: Referring Physician: Jamal Maes Treating Physician/Extender: Frann Rider in Treatment: 0 Active Inactive Orientation to the Wound Care Program Nursing Diagnoses: Knowledge deficit related to the wound healing center program Goals: Patient/caregiver will verbalize understanding of the [facility] Program Date Initiated: 11/27/2015 Goal Status: Active Interventions: Provide education on orientation to the wound center Notes: Wound/Skin Impairment Nursing Diagnoses: Impaired tissue integrity Knowledge deficit related to ulceration/compromised skin integrity Goals: Patient/caregiver will verbalize understanding of skin care regimen Date Initiated: 11/27/2015 Goal Status: Active Ulcer/skin breakdown will have a volume reduction of 30% by week 4 Date Initiated: 11/27/2015 Goal Status: Active Ulcer/skin breakdown will have a volume reduction of 80% by week 12 Date Initiated: 11/27/2015 Goal Status: Active Ulcer/skin breakdown will heal within 14 weeks Date Initiated: 11/27/2015 Goal Status: Active Interventions: Assess patient/caregiver ability to obtain necessary supplies [name], [name] ([id number]) Assess patient/caregiver ability to perform ulcer/skin care regimen upon admission and as needed Provide education on ulcer and skin care Treatment Activities: Skin care regimen initiated : 11/27/2015 Topical wound management initiated : 11/27/2015 Notes: Electronic Signature(s) Signed: 11/27/2015 12:49:55 PM By: Regan Lemming BSN, RN Entered By: Regan Lemming on  11/27/2015 10:25:21 [name], [name] ([id number]) -------------------------------------------------------------------------------- Pain Assessment Details Patient Name: [name], [name] Date of Service: 11/27/2015 9:30 AM Medical Record Number: [id number] Patient Account Number: [id number] Date of Birth/Sex: 05/05/1985 (30 y.o. Female) Treating RN: Carolyne Fiscal, Debi Primary Care Physician:  [name] Other Clinician: Referring Physician: Jamal Maes Treating Physician/Extender: Frann Rider in Treatment: 0 Active Problems Location of Pain Severity and Description of Pain Patient Has Paino Yes Site Locations Pain Location: Pain in Ulcers With Dressing Change: Yes Duration of the Pain. Constant / Intermittento Constant Rate the pain. Current Pain Level: 8 Worst Pain Level: 10 Least Pain Level: 5 Character of Pain Describe the Pain: Burning, Tender, Throbbing Pain Management and Medication Current Pain Management: Electronic Signature(s) Signed: 11/27/2015 12:49:55 PM By: Regan Lemming BSN, RN Signed: 11/28/2015 4:43:58 PM By: Alric Quan Entered By: Regan Lemming on 11/27/2015 09:58:36 [name] ([id number]) -------------------------------------------------------------------------------- Patient/Caregiver Education Details Patient Name: [name] Date of Service: 11/27/2015 9:30 AM Medical Record Number: [id number] Patient Account Number: [id number] Date of Birth/Gender: 1985/05/05 (30 y.o. Female) Treating RN: Carolyne Fiscal, Debi Primary Care Physician: [name] Other Clinician: Referring Physician: Jamal Maes Treating Physician/Extender: Frann Rider in Treatment: 0 Education Assessment Education Provided To: Patient Education Topics Provided Welcome To [facility]: Handouts: Welcome To [facility] Methods: Explain/Verbal Responses: State content correctly Wound/Skin Impairment: Handouts: Other: change dressing as ordered Methods: Demonstration, Explain/Verbal Responses: State content correctly Electronic Signature(s) Signed: 11/28/2015 4:43:58 PM By: Alric Quan Entered By: Alric Quan on 11/27/2015 10:19:42 [name] ([id number]) -------------------------------------------------------------------------------- Wound Assessment Details Patient Name: [name] Date of  Service: 11/27/2015 9:30 AM Medical Record Number: [id number] Patient Account Number: [id number] Date of Birth/Sex: 1985/08/28 (30 y.o. Female) Treating RN: Carolyne Fiscal, Debi Primary Care Physician: [name] Other Clinician: Referring Physician: Jamal Maes Treating Physician/Extender: Frann Rider in Treatment: 0 Wound Status Wound Number: 1 Primary Diabetic Wound/Ulcer of the Lower Etiology: Extremity Wound Location: Left Lower Leg - Medial Wound Open Wounding Event: Bite Status: Date Acquired: 10/17/2015 Comorbid Anemia, Hypertension, Type II Weeks Of Treatment: 0 History: Diabetes, Osteoarthritis, Seizure Clustered Wound: No Disorder Photos Photo Uploaded By: Alric Quan on 11/27/2015 10:53:11 Wound Measurements Length: (cm) 0.9 Width: (cm) 1.4 Depth: (cm) 0.1 Area: (cm) 0.99 Volume: (cm) 0.099 % Reduction in Area: 0% % Reduction in Volume: 0% Epithelialization: None Tunneling: No Undermining: No Wound Description Classification: Grade 1 Foul Odor Af Wound Margin: Distinct, outline attached Exudate Amount: Large Exudate Type: Serous Exudate Color: amber ter Cleansing: No Wound Bed Granulation Amount: Medium (34-66%) Exposed Structure Granulation Quality: Pink Fascia Exposed: No Necrotic Amount: Medium (34-66%) Fat Layer Exposed: No Necrotic Quality: Adherent Slough Tendon Exposed: No [name], [name] ([id number]) Muscle Exposed: No Joint Exposed: No Bone Exposed: No Limited to Skin Breakdown Periwound Skin Texture Texture Color No Abnormalities Noted: No No Abnormalities Noted: No Localized Edema: Yes Temperature / Pain Moisture Temperature: No Abnormality No Abnormalities Noted: No Tenderness on Palpation: Yes Moist: Yes Wound Preparation Ulcer Cleansing: Rinsed/Irrigated with Saline Topical Anesthetic Applied: Other: lidocaine 4%, Treatment Notes Wound #1 (Left, Medial Lower Leg) 1. Cleansed with: Clean wound with Normal  Saline 2. Anesthetic Topical Lidocaine 4% cream to wound bed prior to debridement 4. Dressing Applied: Aquacel Ag 5. Secondary Dressing Applied Bordered Foam Dressing Electronic Signature(s) Signed: 11/27/2015 12:49:55 PM By: Regan Lemming BSN, RN Signed: 11/28/2015 4:43:58 PM By: Alric Quan Entered By: Regan Lemming on 11/27/2015 10:11:02 [name] ([id number]) -------------------------------------------------------------------------------- Vitals  Details Patient Name: [name], [name] Date of Service: 11/27/2015 9:30 AM Medical Record Number: [id number] Patient Account Number: [id number] Date of Birth/Sex: [birth year] (30 y.o. Female) Treating RN: Carolyne Fiscal, Debi Primary Care Physician: [name] Other Clinician: Referring Physician: Jamal Maes Treating Physician/Extender: Frann Rider in Treatment: 0 Vital Signs Time Taken: 09:37 Temperature (F): 98.4 Height (in): 66 Pulse (bpm): 93 Source: Stated Respiratory Rate (breaths/min): 20 Weight (lbs): 180 Blood Pressure (mmHg): 130/96 Source: Measured Reference Range: 80 - 120 mg / dl Body Mass Index (BMI): 29 Electronic Signature(s) Signed: 11/27/2015 12:49:55 PM By: Regan Lemming BSN, RN Entered By: Regan Lemming on 11/27/2015 09:58:26

## 2015-11-29 NOTE — Progress Notes (Signed)
Valerie, [name] ([id number]) Visit Report for 11/27/2015 Chief Complaint Document Details Patient Name: [name], [name] Date of Service: 11/27/2015 9:30 AM Medical Record Number: [id number] Patient Account Number: [id number] Date of Birth/Sex: 04-11-1985 (30 y.o. Female) Treating RN: Carolyne Fiscal, Debi Primary Care Physician: [name] Other Clinician: Referring Physician: Jamal Maes Treating Physician/Extender: Frann Rider in Treatment: 0 Information Obtained from: Patient Chief Complaint Patient presents to the wound care center for a consult due non healing wound left lower extremity which has been there for about a month Electronic Signature(s) Signed: 11/27/2015 11:09:32 AM By: Christin Fudge MD, FACS Entered By: Christin Fudge on 11/27/2015 11:09:32 [name] ([id number]) -------------------------------------------------------------------------------- HPI Details Patient Name: [name] Date of Service: 11/27/2015 9:30 AM Medical Record Number: [id number] Patient Account Number: [id number] Date of Birth/Sex: [birth year] (30 y.o. Female) Treating RN: Carolyne Fiscal, Debi Primary Care Physician: [name] Other Clinician: Referring Physician: Jamal Maes Treating Physician/Extender: Frann Rider in Treatment: 0 History of Present Illness Location: left lower extremity medially Quality: Patient reports experiencing a sharp pain to affected area(s). Severity: Patient states wound are getting worse. Duration: Patient has had the wound for < 4 weeks prior to presenting for treatment Timing: Pain in wound is Intermittent (comes and goes Context: The wound occurred when the patient she was bitten by a spider Modifying Factors: Other treatment(s) tried include:antibioticsand local treatment Associated Signs and Symptoms: Patient reports having increase swelling. HPI Description: 30 year old patient to comes to see [place] for a wound on her left lower  extremity had for about a month and she believes a spider bit her while she was driving. Past medical history of kidney transplant status, hypertension, anemia, posttransplant diabetes mellitus and hyperlipidemia. his medical history also includes post transplant diabetes mellitus, hypertension, learning disability, previous parathyroidectomy for secondary hyperparathyroidism and she has had a regular follow-up with her kidney doctors. He has never been a smoker. Electronic Signature(s) Signed: 11/27/2015 11:29:30 AM By: Christin Fudge MD, FACS Previous Signature: 11/27/2015 11:13:36 AM Version By: Christin Fudge MD, FACS Entered By: Christin Fudge on 11/27/2015 11:29:30 [name] ([id number]) -------------------------------------------------------------------------------- Physical Exam Details Patient Name: [name] Date of Service: 11/27/2015 9:30 AM Medical Record Number: [id number] Patient Account Number: [id number] Date of Birth/Sex: 1985-04-12 (30 y.o. Female) Treating RN: Carolyne Fiscal, Debi Primary Care Physician: [name] Other Clinician: Referring Physician: Jamal Maes Treating Physician/Extender: Frann Rider in Treatment: 0 Constitutional . Pulse regular. Respirations normal and unlabored. Afebrile. . Eyes Nonicteric. Reactive to light. Ears, Nose, Mouth, and Throat Lips, teeth, and gums WNL.Marland Kitchen Moist mucosa without lesions. Neck supple and nontender. No palpable supraclavicular or cervical adenopathy. Normal sized without goiter. Respiratory WNL. No retractions.. Cardiovascular Pedal Pulses WNL. ABIs were noncompressible. he has stage I lymphedema on the left lower extremity and has cut stigmata of venous hypertension. Gastrointestinal (GI) Abdomen without masses or tenderness.. No liver or spleen enlargement or tenderness.. Lymphatic No adneopathy. No adenopathy. No adenopathy. Musculoskeletal Adexa without tenderness or enlargement.. Digits and  nails w/o clubbing, cyanosis, infection, petechiae, ischemia, or inflammatory conditions.. Integumentary (Hair, Skin) No suspicious lesions. No crepitus or fluctuance. No peri-wound warmth or erythema. No masses.Marland Kitchen Psychiatric Judgement and insight Intact.. No evidence of depression, anxiety, or agitation.. Notes the left lower extremity has significant amount of lymphedema and there is a ulcerated wound a few inches above her left ankle which has minimal debris and surrounding darkening which is all suggestive of venous hypertension and stigmata of varicose veins. Electronic Signature(s) Signed: 11/27/2015  11:31:00 AM By: Christin Fudge MD, FACS Entered By: Christin Fudge on 11/27/2015 11:30:59 [name] ([id number]) -------------------------------------------------------------------------------- Physician Orders Details Patient Name: [name] Date of Service: 11/27/2015 9:30 AM Medical Record Patient Account Number: [id number] Number: Afful, RN, BSN, Treating RN: 09-26-1985 (30 y.o. Velva Harman Date of Birth/Sex: Female) Other Clinician: Primary Care Physician: Jeanie Cooks, EDWIN Treating Cyla Haluska Referring Physician: Jamal Maes Physician/Extender: Suella Grove in Treatment: 0 Verbal / Phone Orders: Yes Clinician: Afful, RN, BSN, Rita Read Back and Verified: Yes Diagnosis Coding Wound Cleansing Wound #1 Left,Medial Lower Leg o Cleanse wound with mild soap and water o May Shower, gently pat wound dry prior to applying new dressing. o May shower with protection. Primary Wound Dressing Wound #1 Left,Medial Lower Leg o Aquacel Ag Secondary Dressing Wound #1 Left,Medial Lower Leg o Boardered Foam Dressing Dressing Change Frequency Wound #1 Left,Medial Lower Leg o Change dressing every other day. Follow-up Appointments Wound #1 Left,Medial Lower Leg o Return Appointment in 1 week. Edema Control Wound #1 Left,Medial Lower Leg o Patient to wear  own compression stockings o Elevate legs to the level of the heart and pump ankles as often as possible o Support Garment 20-30 mm/Hg pressure to: Additional Orders / Instructions Wound #1 Left,Medial Lower Leg o Increase protein intake. o OK to return to work with the following restrictions: o Activity as tolerated ALEXARAE, OLIVA ([id number]) Services and Therapies o Arterial Studies- Unilateral - Left leg o Venous Studies -Unilateral - Left leg Electronic Signature(s) Signed: 11/27/2015 12:46:10 PM By: Christin Fudge MD, FACS Signed: 11/27/2015 12:49:55 PM By: Regan Lemming BSN, RN Entered By: Regan Lemming on 11/27/2015 10:29:32 [name] ([id number]) -------------------------------------------------------------------------------- Problem List Details Patient Name: [name] Date of Service: 11/27/2015 9:30 AM Medical Record Number: [id number] Patient Account Number: [id number] Date of Birth/Sex: 1985-04-30 (30 y.o. 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 Female) Treating RN: Carolyne Fiscal, Debi Primary Care Physician: [name] Other Clinician: Referring Physician: Jamal Maes Treating Physician/Extender: Frann Rider in Treatment: 0 Subjective Chief Complaint Information obtained from Patient Patient presents to the wound care center for a consult due non healing wound left lower extremity which has been there for about a month History of Present Illness (HPI) The following HPI elements were documented for the patient's wound: Location: left lower extremity medially Quality: Patient reports experiencing a sharp pain to affected area(s). Severity: Patient states wound are getting worse. Duration: Patient has had the wound for < 4 weeks prior to presenting for treatment Timing: Pain in wound is Intermittent (comes and goes Context: The wound occurred when the patient she was bitten by a spider Modifying Factors: Other treatment(s) tried include:antibioticsand local treatment Associated Signs and Symptoms: Patient reports having increase swelling. 30 year old patient to  comes to see [place] for a wound on her left lower extremity had for about a month and she believes a spider bit her while she was driving. Past medical history of kidney transplant status, hypertension, anemia, posttransplant diabetes mellitus and hyperlipidemia. his medical history also includes post transplant diabetes mellitus, hypertension, learning disability, previous parathyroidectomy for secondary hyperparathyroidism and she has had a regular follow-up with her kidney doctors. He has never been a smoker. Wound History Patient presents with 1 open wound that has been present for approximately 10/20/15. Patient has been treating wound in the following manner: neosporin, salt water. Laboratory tests have not been performed in the last month. Patient  reportedly has not tested positive for an antibiotic resistant organism. Patient reportedly has not tested positive for osteomyelitis. Patient reportedly has not had testing performed to evaluate circulation in the legs. Patient experiences the following problems associated with their wounds: infection, swelling. Patient History Information obtained from Patient. Allergies shellfish, Benadryl (Severity: Severe, Reaction: anaphalaxis), banana, nitrofurantoin, ARGUSTA, MCGANN ([id number]) Family History Diabetes - Father, Heart Disease - Father, Hypertension - Mother, Father, No family history of Cancer, Hereditary Spherocytosis, Kidney Disease, Lung Disease, Seizures, Stroke, Thyroid Problems, Tuberculosis. Social History Never smoker, Marital Status - Single, Alcohol Use - Never, Drug Use - No History, Caffeine Use - Daily. Medical History Hematologic/Lymphatic Patient has history of Anemia Cardiovascular Patient has history of Hypertension Endocrine Patient has history of Type II Diabetes Musculoskeletal Patient has history of Osteoarthritis Neurologic Patient has history of Seizure Disorder - hx Patient is treated with Insulin. Blood sugar is tested. Review of Systems (ROS) Constitutional Symptoms (General Health) Complains or has symptoms of Fever. Eyes The patient has no complaints or symptoms. Ear/Nose/Mouth/Throat The patient has no complaints or symptoms. Respiratory The patient has no complaints or symptoms. Cardiovascular hyperlipidemia Gastrointestinal GERD Endocrine Complains or has symptoms of Thyroid disease. Genitourinary kidney transplant4/17/2007 before that was on dialysis for 10 years Immunological The patient has no complaints or symptoms. Integumentary (Skin) Complains or has symptoms of Wounds - spider bite. Oncologic The patient has no complaints or symptoms. Psychiatric Complains or has symptoms of Anxiety, depression BROOKLYNN, BRANDENBURG.  ([id number]) medication list reviewed and she is on: lisinopril, simvastatin, prednisone, tacrolimus, Protonix, Procrit, multivitamins Objective Constitutional Pulse regular. Respirations normal and unlabored. Afebrile. Vitals Time Taken: 9:37 AM, Height: 66 in, Source: Stated, Weight: 180 lbs, Source: Measured, BMI: 29, Temperature: 98.4 F, Pulse: 93 bpm, Respiratory Rate: 20 breaths/min, Blood Pressure: 130/96 mmHg. Eyes Nonicteric. Reactive to light. Ears, Nose, Mouth, and Throat Lips, teeth, and gums WNL.Marland Kitchen Moist mucosa without lesions. Neck supple and nontender. No palpable supraclavicular or cervical adenopathy. Normal sized without goiter. Respiratory WNL. No retractions.. Cardiovascular Pedal Pulses WNL. ABIs were noncompressible. he has stage I lymphedema on the left lower extremity and has cut stigmata of venous hypertension. Gastrointestinal (GI) Abdomen without masses or tenderness.. No liver or spleen enlargement or tenderness.. Lymphatic No adneopathy. No adenopathy. No adenopathy. Musculoskeletal Adexa without tenderness or enlargement.. Digits and nails w/o clubbing, cyanosis, infection, petechiae, ischemia, or inflammatory conditions.Marland Kitchen Psychiatric Judgement and insight Intact.. No evidence of depression, anxiety, or agitation.. General Notes: the left lower extremity has significant amount of lymphedema and there is a ulcerated wound a few inches above her left ankle which has minimal debris and surrounding darkening which is all suggestive of venous hypertension and stigmata of varicose veins. LILLIEMAE, FRUGE ([id number]) Integumentary (Hair, Skin) No suspicious lesions. No crepitus or fluctuance. No peri-wound warmth  or erythema. No masses.. Wound #1 status is Open. Original cause of wound was Bite. The wound is located on the Left,Medial Lower Leg. The wound measures 0.9cm length x 1.4cm width x 0.1cm depth; 0.99cm^2 area and 0.099cm^3 volume. The wound is  limited to skin breakdown. There is no tunneling or undermining noted. There is a large amount of serous drainage noted. The wound margin is distinct with the outline attached to the wound base. There is medium (34-66%) pink granulation within the wound bed. There is a medium (34- 66%) amount of necrotic tissue within the wound bed including Adherent Slough. The periwound skin appearance exhibited: Localized Edema, Moist. Periwound temperature was noted as No Abnormality. The periwound has tenderness on palpation. Assessment Active Problems ICD-10 E13.622 - Other specified diabetes mellitus with other skin ulcer I73.9 - Peripheral vascular disease, unspecified I89.0 - Lymphedema, not elsewhere classified L97.222 - Non-pressure chronic ulcer of left calf with fat layer exposed Z94.0 - Kidney transplant status 30 year old young lady who is status post renal transplant and posttransplant diabetes mellitus and has been immunocompromised has a ulcerated area on the left medial lower extremity and has other stigmata of peripheral vascular disease and venous hypertension. After review I have recommended: 1. silver alginate and compression stockings of the 20-30 mm variety 2. elevation and exercise 3. good control of her diabetes mellitus -- last hemoglobin A1c was 11% 4. Arterial and venous duplex studies of both lower extremities 5. regular visits to the wound center Plan Wound Cleansing: Wound #1 Left,Medial Lower Leg: Cleanse wound with mild soap and water May Shower, gently pat wound dry prior to applying new dressing. May shower with protection. BRIENNE, LIGUORI ([id number]) Primary Wound Dressing: Wound #1 Left,Medial Lower Leg: Aquacel Ag Secondary Dressing: Wound #1 Left,Medial Lower Leg: Boardered Foam Dressing Dressing Change Frequency: Wound #1 Left,Medial Lower Leg: Change dressing every other day. Follow-up Appointments: Wound #1 Left,Medial Lower Leg: Return Appointment  in 1 week. Edema Control: Wound #1 Left,Medial Lower Leg: Patient to wear own compression stockings Elevate legs to the level of the heart and pump ankles as often as possible Support Garment 20-30 mm/Hg pressure to: Additional Orders / Instructions: Wound #1 Left,Medial Lower Leg: Increase protein intake. OK to return to work with the following restrictions: Activity as tolerated Services and Therapies ordered were: Arterial Studies- Unilateral - Left leg, Venous Studies -Unilateral - Left leg 30 year old young lady who is status post renal transplant and posttransplant diabetes mellitus and has been immunocompromised has a ulcerated area on the left medial lower extremity and has other stigmata of peripheral vascular disease and venous hypertension. After review I have recommended: 1. silver alginate and compression stockings of the 20-30 mm variety 2. elevation and exercise 3. good control of her diabetes mellitus -- last hemoglobin A1c was 11% 4. Arterial and venous duplex studies of both lower extremities 5. regular visits to the wound center Electronic Signature(s) Signed: 11/27/2015 11:34:59 AM By: Christin Fudge MD, FACS Entered By: Christin Fudge on 11/27/2015 11:34:59 [name] ([id number]) -------------------------------------------------------------------------------- ROS/PFSH Details Patient Name: [name] Date of Service: 11/27/2015 9:30 AM Medical Record Number: [id number] Patient Account Number: [id number] Date of Birth/Sex: June 11, 1985 (30 y.o. Female) Treating RN: Carolyne Fiscal, Debi Primary Care Physician: [name] Other Clinician: Referring Physician: Jamal Maes Treating Physician/Extender: Frann Rider in Treatment: 0 Information Obtained From Patient Wound History Do you currently have one or more open woundso Yes How many open wounds do you currently  haveo 1 Approximately how long have you had your woundso 10/20/15 How have you been  treating your wound(s) until nowo neosporin, salt water Has your wound(s) ever healed and then re-openedo No Have you had any lab work done in the past montho No Have you tested positive for an antibiotic resistant organism (MRSA, VRE)o No Have you tested positive for osteomyelitis (bone infection)o No Have you had any tests for circulation on your legso No Have you had other problems associated with your woundso Infection, Swelling Constitutional Symptoms (General Health) Complaints and Symptoms: Positive for: Fever Endocrine Complaints and Symptoms: Positive for: Thyroid disease Medical History: Positive for: Type II Diabetes Time with diabetes: 2014 Treated with: Insulin Blood sugar tested every day: Yes Tested : 3x a day Integumentary (Skin) Complaints and Symptoms: Positive for: Wounds - spider bite Psychiatric Complaints and Symptoms: Positive for: Anxiety Review of System Notes: MARIBEL, HADLEY ([id number]) depression Eyes Complaints and Symptoms: No Complaints or Symptoms Ear/Nose/Mouth/Throat Complaints and Symptoms: No Complaints or Symptoms Hematologic/Lymphatic Medical History: Positive for: Anemia Respiratory Complaints and Symptoms: No Complaints or Symptoms Cardiovascular Complaints and Symptoms: Review of System Notes: hyperlipidemia Medical History: Positive for: Hypertension Gastrointestinal Complaints and Symptoms: Review of System Notes: GERD Genitourinary Complaints and Symptoms: Review of System Notes: kidney transplant4/17/2007 before that was on dialysis for 10 years Immunological Complaints and Symptoms: No Complaints or Symptoms Musculoskeletal Medical History: Positive for: Osteoarthritis CANDISS, GALEANA ([id number]) Neurologic Medical History: Positive for: Seizure Disorder - hx Oncologic Complaints and Symptoms: No Complaints or Symptoms Family and Social History Cancer: No; Diabetes: Yes - Father; Heart Disease: Yes -  Father; Hereditary Spherocytosis: No; Hypertension: Yes - Mother, Father; Kidney Disease: No; Lung Disease: No; Seizures: No; Stroke: No; Thyroid Problems: No; Tuberculosis: No; Never smoker; Marital Status - Single; Alcohol Use: Never; Drug Use: No History; Caffeine Use: Daily; Financial Concerns: No; Food, Clothing or Shelter Needs: No; Support System Lacking: No; Transportation Concerns: No; Advanced Directives: No; Patient does not want information on Advanced Directives; Do not resuscitate: No; Living Will: No; Medical Power of Attorney: No Physician Affirmation I have reviewed and agree with the above information. Electronic Signature(s) Signed: 11/27/2015 12:46:10 PM By: Christin Fudge MD, FACS Signed: 11/28/2015 4:43:58 PM By: Alric Quan Entered By: Christin Fudge on 11/27/2015 10:43:05 [name] ([id number]) -------------------------------------------------------------------------------- SuperBill Details Patient Name: [name] Date of Service: 11/27/2015 Medical Record Number: [id number] Patient Account Number: [id number] Date of Birth/Sex: 1985/09/22 (30 y.o. Female) Treating RN: Carolyne Fiscal, Debi Primary Care Physician: [name] Other Clinician: Referring Physician: Jamal Maes Treating Physician/Extender: Frann Rider in Treatment: 0 Diagnosis Coding ICD-10 Codes Code Description [phone] Other specified diabetes mellitus with other skin ulcer I73.9 Peripheral vascular disease, unspecified I89.0 Lymphedema, not elsewhere classified L97.222 Non-pressure chronic ulcer of left calf with fat layer exposed Z94.0 Kidney transplant status Facility Procedures CPT4 Code: [id number] Description: 99213 - WOUND CARE VISIT-LEV 3 EST PT Modifier: Quantity: 1 Physician Procedures CPT4 Code: 9798921 Description: 19417 - WC PHYS LEVEL 4 - NEW PT ICD-10 Description Diagnosis E13.622 Other specified diabetes mellitus with other skin I73.9 Peripheral vascular  disease, unspecified I89.0 Lymphedema, not elsewhere classified L97.222 Non-pressure chronic  ulcer of left calf with fat l Modifier: ulcer ayer exposed Quantity: 1 Electronic Signature(s) Signed: 11/27/2015 4:26:58 PM By: Regan Lemming BSN, RN Signed: 11/28/2015 12:32:55 PM By: Christin Fudge MD, FACS Previous Signature: 11/27/2015 11:35:17 AM Version By: Christin Fudge MD, FACS Entered By: Regan Lemming on 11/27/2015 16:26:57

## 2015-12-04 ENCOUNTER — Encounter: Payer: Medicare Other | Admitting: Surgery

## 2015-12-04 DIAGNOSIS — Z940 Kidney transplant status: Secondary | ICD-10-CM | POA: Diagnosis not present

## 2015-12-04 DIAGNOSIS — I10 Essential (primary) hypertension: Secondary | ICD-10-CM | POA: Diagnosis not present

## 2015-12-04 DIAGNOSIS — I739 Peripheral vascular disease, unspecified: Secondary | ICD-10-CM | POA: Diagnosis not present

## 2015-12-04 DIAGNOSIS — E11622 Type 2 diabetes mellitus with other skin ulcer: Secondary | ICD-10-CM | POA: Diagnosis not present

## 2015-12-04 DIAGNOSIS — I890 Lymphedema, not elsewhere classified: Secondary | ICD-10-CM | POA: Diagnosis not present

## 2015-12-04 DIAGNOSIS — E13622 Other specified diabetes mellitus with other skin ulcer: Secondary | ICD-10-CM | POA: Diagnosis not present

## 2015-12-04 DIAGNOSIS — L97222 Non-pressure chronic ulcer of left calf with fat layer exposed: Secondary | ICD-10-CM | POA: Diagnosis not present

## 2015-12-04 DIAGNOSIS — L97821 Non-pressure chronic ulcer of other part of left lower leg limited to breakdown of skin: Secondary | ICD-10-CM | POA: Diagnosis not present

## 2015-12-05 NOTE — Progress Notes (Signed)
ANYE, BROSE ([id number]) Visit Report for 12/04/2015 Arrival Information Details Patient Name: [name], [name] Date of Service: 12/04/2015 8:00 AM Medical Record Number: [id number] Patient Account Number: [id number] Date of Birth/Sex: 1985/12/14 (30 y.o. Female) Treating RN: Cornell Barman Primary Care Physician: [name] Other Clinician: Referring Physician: [name] Treating Physician/Extender: Frann Rider in Treatment: 1 Visit Information History Since Last Visit Added or deleted any medications: No Patient Arrived: Ambulatory Any new allergies or adverse reactions: No Arrival Time: 09:05 Had a fall or experienced change in No Accompanied By: self activities of daily living that may affect Transfer Assistance: None risk of falls: Patient Identification Verified: Yes Signs or symptoms of abuse/neglect since last No Secondary Verification Process Yes visito Completed: Hospitalized since last visit: No Patient Requires Transmission- No Has Dressing in Place as Prescribed: Yes Based Precautions: Pain Present Now: No Patient Has Alerts: Yes Patient Alerts: DM II PT REFUSES FACE PHOTO Electronic Signature(s) Signed: 12/05/2015 10:50:50 AM By: Gretta Cool, RN, BSN, Kim RN, BSN Entered By: Gretta Cool, RN, BSN, Kim on 12/04/2015 09:12:51 [name] ([id number]) -------------------------------------------------------------------------------- Encounter Discharge Information Details Patient Name: [name] Date of Service: 12/04/2015 8:00 AM Medical Record Number: [id number] Patient Account Number: [id number] Date of Birth/Sex: October 29, 1985 (30 y.o. Female) Treating RN: Cornell Barman Primary Care Physician: [name] Other Clinician: Referring Physician: [name] Treating Physician/Extender: Frann Rider in Treatment: 1 Encounter Discharge Information Items Discharge Pain Level: 0 Discharge Condition: Stable Ambulatory Status:  Ambulatory Discharge Destination: Home Transportation: Private Auto Accompanied By: SELF Schedule Follow-up Appointment: Yes Medication Reconciliation completed and provided to Patient/Care Yes Derisha Funderburke: Provided on Clinical Summary of Care: 12/04/2015 Form Type Recipient Paper Patient DS Electronic Signature(s) Signed: 12/05/2015 10:50:50 AM By: Gretta Cool RN, BSN, Kim RN, BSN Previous Signature: 12/04/2015 9:41:25 AM Version By: Ruthine Dose Entered By: Gretta Cool RN, BSN, Kim on 12/04/2015 09:43:26 [name] ([id number]) -------------------------------------------------------------------------------- Lower Extremity Assessment Details Patient Name: [name] Date of Service: 12/04/2015 8:00 AM Medical Record Number: [id number] Patient Account Number: [id number] Date of Birth/Sex: 1986/02/13 (30 y.o. Female) Treating RN: Cornell Barman Primary Care Physician: [name] Other Clinician: Referring Physician: [name] Treating Physician/Extender: Frann Rider in Treatment: 1 Edema Assessment Assessed: [Left: No] [Right: No] E[Left: dema] [Right: :] Calf Left: Right: Point of Measurement: 30 cm From Medial Instep 30.5 cm cm Ankle Left: Right: Point of Measurement: 9 cm From Medial Instep 21.5 cm cm Vascular Assessment Pulses: Posterior Tibial Dorsalis Pedis Palpable: [Left:Yes] Extremity colors, hair growth, and conditions: Extremity Color: [Left:Normal] Hair Growth on Extremity: [Left:No] Temperature of Extremity: [Left:Warm] Capillary Refill: [Left:< 3 seconds] Dependent Rubor: [Left:No] Blanched when Elevated: [Left:No] Lipodermatosclerosis: [Left:No] Electronic Signature(s) Signed: 12/05/2015 10:50:50 AM By: Gretta Cool, RN, BSN, Kim RN, BSN Entered By: Gretta Cool, RN, BSN, Kim on 12/04/2015 09:09:29 [name] ([id number]) -------------------------------------------------------------------------------- Multi Wound Chart Details Patient Name: [name] Date of Service: 12/04/2015 8:00 AM Medical Record Number: [id number] Patient Account Number: [id number] Date of Birth/Sex: Sep 26, 1985 (30 y.o. Female) Treating RN: Cornell Barman Primary Care Physician: [name] Other Clinician: Referring Physician: [name] Treating Physician/Extender: Frann Rider in Treatment: 1 Vital Signs Height(in): 66 Pulse(bpm): 92 Weight(lbs): 180 Blood Pressure 142/104 (mmHg): Body Mass Index(BMI): 29 Temperature(F): 98.0 Respiratory Rate 18 (breaths/min): Photos: [N/A:N/A] Wound Location: Left Lower Leg - Medial N/A N/A Wounding Event: Bite N/A N/A Primary Etiology: Diabetic Wound/Ulcer of N/A N/A the Lower Extremity Comorbid History: Anemia, Hypertension, N/A N/A Type II Diabetes, Osteoarthritis, Seizure  Disorder Date Acquired: 10/17/2015 N/A N/A Weeks of Treatment: 1 N/A N/A Wound Status: Open N/A N/A Measurements L x W x D 0.8x1.2x0.1 N/A N/A (cm) Area (cm) : 0.754 N/A N/A Volume (cm) : 0.075 N/A N/A % Reduction in Area: 23.80% N/A N/A % Reduction in Volume: 24.20% N/A N/A Classification: Grade 1 N/A N/A Exudate Amount: Large N/A N/A Exudate Type: Serous N/A N/A Exudate Color: amber N/A N/A Wound Margin: Distinct, outline attached N/A N/A Granulation Amount: Medium (34-66%) N/A N/A Granulation Quality: Pink N/A N/A [name], [name] ([id number]) Necrotic Amount: Medium (34-66%) N/A N/A Exposed Structures: Fascia: No N/A N/A Fat: No Tendon: No Muscle: No Joint: No Bone: No Limited to Skin Breakdown Epithelialization: None N/A N/A Periwound Skin Texture: Edema: Yes N/A N/A Induration: Yes Periwound Skin Moist: Yes N/A N/A Moisture: Periwound Skin Color: Hemosiderin Staining: Yes N/A N/A Temperature: No Abnormality N/A N/A Tenderness on Yes N/A N/A Palpation: Wound Preparation: Ulcer Cleansing: N/A N/A Rinsed/Irrigated with Saline Topical Anesthetic Applied: Other: lidocaine 4% Treatment  Notes Electronic Signature(s) Signed: 12/05/2015 10:50:50 AM By: Gretta Cool, RN, BSN, Kim RN, BSN Entered By: Gretta Cool, RN, BSN, Kim on 12/04/2015 09:13:34 [name] ([id number]) -------------------------------------------------------------------------------- Multi-Disciplinary Care Plan Details Patient Name: [name] Date of Service: 12/04/2015 8:00 AM Medical Record Number: [id number] Patient Account Number: [id number] Date of Birth/Sex: 1986/03/05 (30 y.o. Female) Treating RN: Cornell Barman Primary Care Physician: [name] Other Clinician: Referring Physician: [name] Treating Physician/Extender: Frann Rider in Treatment: 1 Active Inactive Orientation to the Wound Care Program Nursing Diagnoses: Knowledge deficit related to the wound healing center program Goals: Patient/caregiver will verbalize understanding of the [facility] Date Initiated: 11/27/2015 Goal Status: Active Interventions: Provide education on orientation to the wound center Notes: Wound/Skin Impairment Nursing Diagnoses: Impaired tissue integrity Knowledge deficit related to ulceration/compromised skin integrity Goals: Patient/caregiver will verbalize understanding of skin care regimen Date Initiated: 11/27/2015 Goal Status: Active Ulcer/skin breakdown will have a volume reduction of 30% by week 4 Date Initiated: 11/27/2015 Goal Status: Active Ulcer/skin breakdown will have a volume reduction of 80% by week 12 Date Initiated: 11/27/2015 Goal Status: Active Ulcer/skin breakdown will heal within 14 weeks Date Initiated: 11/27/2015 Goal Status: Active Interventions: Assess patient/caregiver ability to obtain necessary supplies [name], [name] ([id number]) Assess patient/caregiver ability to perform ulcer/skin care regimen upon admission and as needed Provide education on ulcer and skin care Treatment Activities: Skin care regimen initiated : 11/27/2015 Topical wound  management initiated : 11/27/2015 Notes: Electronic Signature(s) Signed: 12/05/2015 10:50:50 AM By: Gretta Cool, RN, BSN, Kim RN, BSN Entered By: Gretta Cool, RN, BSN, Kim on 12/04/2015 09:13:28 [name] ([id number]) -------------------------------------------------------------------------------- Pain Assessment Details Patient Name: [name] Date of Service: 12/04/2015 8:00 AM Medical Record Number: [id number] Patient Account Number: [id number] Date of Birth/Sex: 07-Mar-1986 (30 y.o. Female) Treating RN: Cornell Barman Primary Care Physician: [name] Other Clinician: Referring Physician: [name] Treating Physician/Extender: Frann Rider in Treatment: 1 Active Problems Location of Pain Severity and Description of Pain Patient Has Paino No Site Locations With Dressing Change: No Pain Management and Medication Current Pain Management: Electronic Signature(s) Signed: 12/05/2015 10:50:50 AM By: Gretta Cool, RN, BSN, Kim RN, BSN Entered By: Gretta Cool, RN, BSN, Kim on 12/04/2015 09:05:55 [name] ([id number]) -------------------------------------------------------------------------------- Patient/Caregiver Education Details Patient Name: [name] Date of Service: 12/04/2015 8:00 AM Medical Record Number: [id number] Patient Account Number: [id number] Date of Birth/Gender: 10-23-1985 (30 y.o. Female) Treating RN: Cornell Barman Primary Care Physician: Jeanie Cooks,  EDWIN Other Clinician: Referring Physician: [name] Treating Physician/Extender: Frann Rider in Treatment: 1 Education Assessment Education Provided To: Patient Education Topics Provided Wound/Skin Impairment: Handouts: Caring for Your Ulcer Methods: Demonstration Responses: State content correctly Electronic Signature(s) Signed: 12/05/2015 10:50:50 AM By: Gretta Cool, RN, BSN, Kim RN, BSN Entered By: Gretta Cool, RN, BSN, Kim on 12/04/2015 09:43:37 [name]  ([id number]) -------------------------------------------------------------------------------- Wound Assessment Details Patient Name: [name] Date of Service: 12/04/2015 8:00 AM Medical Record Number: [id number] Patient Account Number: [id number] Date of Birth/Sex: 01/08/1986 (30 y.o. Female) Treating RN: Cornell Barman Primary Care Physician: [name] Other Clinician: Referring Physician: [name] Treating Physician/Extender: Frann Rider in Treatment: 1 Wound Status Wound Number: 1 Primary Diabetic Wound/Ulcer of the Lower Etiology: Extremity Wound Location: Left Lower Leg - Medial Wound Open Wounding Event: Bite Status: Date Acquired: 10/17/2015 Comorbid Anemia, Hypertension, Type II Weeks Of Treatment: 1 History: Diabetes, Osteoarthritis, Seizure Clustered Wound: No Disorder Photos Wound Measurements Length: (cm) 0.8 Width: (cm) 1.2 Depth: (cm) 0.1 Area: (cm) 0.754 Volume: (cm) 0.075 % Reduction in Area: 23.8% % Reduction in Volume: 24.2% Epithelialization: None Wound Description Classification: Grade 1 Foul Odor Af Wound Margin: Distinct, outline attached Exudate Amount: Large Exudate Type: Serous Exudate Color: amber ter Cleansing: No Wound Bed Granulation Amount: Medium (34-66%) Exposed Structure Granulation Quality: Pink Fascia Exposed: No Necrotic Amount: Medium (34-66%) Fat Layer Exposed: No Necrotic Quality: Adherent Slough Tendon Exposed: No Muscle Exposed: No Joint Exposed: No Bone Exposed: No [name], [name] ([id number]) Limited to Skin Breakdown Periwound Skin Texture Texture Color No Abnormalities Noted: No No Abnormalities Noted: No Induration: Yes Hemosiderin Staining: Yes Localized Edema: Yes Temperature / Pain Moisture Temperature: No Abnormality No Abnormalities Noted: No Tenderness on Palpation: Yes Moist: Yes Wound Preparation Ulcer Cleansing: Rinsed/Irrigated with Saline Topical Anesthetic Applied: Other:  lidocaine 4%, Treatment Notes Wound #1 (Left, Medial Lower Leg) 1. Cleansed with: Clean wound with Normal Saline 2. Anesthetic Topical Lidocaine 4% cream to wound bed prior to debridement 4. Dressing Applied: Aquacel Ag 5. Secondary Dressing Applied Bordered Foam Dressing Electronic Signature(s) Signed: 12/05/2015 10:50:50 AM By: Gretta Cool, RN, BSN, Kim RN, BSN Entered By: Gretta Cool, RN, BSN, Kim on 12/04/2015 09:12:10 [name] ([id number]) -------------------------------------------------------------------------------- Vitals Details Patient Name: [name] Date of Service: 12/04/2015 8:00 AM Medical Record Number: [id number] Patient Account Number: [id number] Date of Birth/Sex: 1985/03/30 (30 y.o. Female) Treating RN: Cornell Barman Primary Care Physician: [name] Other Clinician: Referring Physician: [name] Treating Physician/Extender: Frann Rider in Treatment: 1 Vital Signs Time Taken: 09:05 Temperature (F): 98.0 Height (in): 66 Pulse (bpm): 92 Weight (lbs): 180 Respiratory Rate (breaths/min): 18 Body Mass Index (BMI): 29 Blood Pressure (mmHg): 142/104 Reference Range: 80 - 120 mg / dl Notes Patient states she has not taken her blood pressure medications this morning. Patient instructed to take medications asap and recheck BP. If still high patient instructed to followup with PCP. Patient understands and agrees. Electronic Signature(s) Signed: 12/05/2015 10:50:50 AM By: Gretta Cool, RN, BSN, Kim RN, BSN Entered By: Gretta Cool, RN, BSN, Kim on 12/04/2015 09:18:37

## 2015-12-05 NOTE — Progress Notes (Signed)
[name] ([id number]) Visit Report for 12/04/2015 Chief Complaint Document Details Patient Name: [name] Date of Service: 12/04/2015 8:00 AM Medical Record Number: [id number] Patient Account Number: [id number] Date of Birth/Sex: [birth year] (30 y.o. Female) Treating RN: [name] Primary Care Physician: [name] Other Clinician: Referring Physician: Nolene [name] Treating Physician/Extender: [name] in Treatment: 1 Information Obtained from: Patient Chief Complaint Patient presents to the wound care center for a consult due non healing wound left lower extremity which has been there for about a month Electronic Signature(s) Signed: 12/04/2015 9:56:35 AM By: Valerie Fudge MD, FACS Entered By: [name] on 12/04/2015 09:56:34 [name] ([id number]) -------------------------------------------------------------------------------- Debridement Details Patient Name: [name] Date of Service: 12/04/2015 8:00 AM Medical Record Number: [id number] Patient Account Number: [id number] Date of Birth/Sex: 24-Dec-1985 (30 y.o. Female) Treating RN: [name] Primary Care Physician: [name] Other Clinician: Referring Physician: Nolene [name] Treating Physician/Extender: [name] in Treatment: 1 Debridement Performed for Wound #1 Left,Medial Lower Leg Assessment: Performed By: Physician Valerie Fudge, MD Debridement: Debridement Pre-procedure Yes - 09:38 Verification/Time Out Taken: Start Time: 09:39 Pain Control: Other : IDOCIANE 4% Level: Skin/Subcutaneous Tissue Total Area Debrided (L x 0.8 (cm) x 1.2 (cm) = 0.96 (cm) W): Tissue and other Viable, Non-Viable, Exudate, Fibrin/Slough, Subcutaneous material debrided: Instrument: Curette Bleeding: Minimum Hemostasis Achieved: Pressure End Time: 09:45 Procedural Pain: 0 Post Procedural Pain: 0 Response to Treatment: Procedure was tolerated well Post Debridement Measurements  of Total Wound Length: (cm) 0.8 Width: (cm) 1.2 Depth: (cm) 0.2 Volume: (cm) 0.151 Character of Wound/Ulcer Post Requires Further Debridement Debridement: Severity of Tissue Post Debridement: Fat layer exposed Post Procedure Diagnosis Same as Pre-procedure Electronic Signature(s) Signed: 12/04/2015 9:56:27 AM By: Valerie Fudge MD, FACS Signed: 12/05/2015 10:50:50 AM By: Valerie Cool RN, BSN, Kim RN, BSN Entered By: [name] on 12/04/2015 09:56:26 [name], [name] ([id number]) [name], [name] ([id number]) -------------------------------------------------------------------------------- HPI Details Patient Name: [name] Date of Service: 12/04/2015 8:00 AM Medical Record Number: [id number] Patient Account Number: [id number] Date of Birth/Sex: Jan 15, 1986 (30 y.o. Female) Treating RN: [name] Primary Care Physician: [name] Other Clinician: Referring Physician: Nolene [name] Treating Physician/Extender: [name] in Treatment: 1 History of Present Illness Location: left lower extremity medially Quality: Patient reports experiencing a sharp pain to affected area(s). Severity: Patient states wound are getting worse. Duration: Patient has had the wound for < 4 weeks prior to presenting for treatment Timing: Pain in wound is Intermittent (comes and goes Context: The wound occurred when the patient she was bitten by a spider Modifying Factors: Other treatment(s) tried include:antibioticsand local treatment Associated Signs and Symptoms: Patient reports having increase swelling. HPI Description: 30 year old patient to comes to see [place] for a wound on her left lower extremity had for about a month and she believes a spider bit her while she was driving. Past medical history of kidney transplant status, hypertension, anemia, posttransplant diabetes mellitus and hyperlipidemia. his medical history also includes post transplant diabetes mellitus, hypertension, learning  disability, previous parathyroidectomy for secondary hyperparathyroidism and she has had a regular follow-up with her kidney doctors. He has never been a smoker. Electronic Signature(s) Signed: 12/04/2015 9:56:39 AM By: Valerie Fudge MD, FACS Entered By: [name] on 12/04/2015 09:56:38 [name] ([id number]) -------------------------------------------------------------------------------- Physical Exam Details Patient Name: [name] Date of Service: 12/04/2015 8:00 AM Medical Record Number: [id number] Patient Account Number: [id number] Date of Birth/Sex: 08-25-1985 (30 y.o. Female) Treating RN: [name] Primary Care  Physician: [name] Other Clinician: Referring Physician: Nolene [name] Treating Physician/Extender: [name] in Treatment: 1 Constitutional . Pulse regular. Respirations normal and unlabored. Afebrile. . Eyes Nonicteric. Reactive to light. Ears, Nose, Mouth, and Throat Lips, teeth, and gums WNL.Marland Kitchen Moist mucosa without lesions. Neck supple and nontender. No palpable supraclavicular or cervical adenopathy. Normal sized without goiter. Respiratory WNL. No retractions.. Cardiovascular Pedal Pulses WNL. No clubbing, cyanosis or edema. Lymphatic No adneopathy. No adenopathy. No adenopathy. Musculoskeletal Adexa without tenderness or enlargement.. Digits and nails w/o clubbing, cyanosis, infection, petechiae, ischemia, or inflammatory conditions.. Integumentary (Hair, Skin) No suspicious lesions. No crepitus or fluctuance. No peri-wound warmth or erythema. No masses.Marland Kitchen Psychiatric Judgement and insight Intact.. No evidence of depression, anxiety, or agitation.. Notes wound on the left lower extremity continues to have some minimal debris which was sharply removed with a curette and bleeding controlled with pressure Electronic Signature(s) Signed: 12/04/2015 9:57:17 AM By: Valerie Fudge MD, FACS Entered By: [name] on 12/04/2015  09:57:17 [name] ([id number]) -------------------------------------------------------------------------------- Physician Orders Details Patient Name: [name] Date of Service: 12/04/2015 8:00 AM Medical Record Number: [id number] Patient Account Number: [id number] Date of Birth/Sex: 30-Jun-1985 (30 y.o. Female) Treating RN: [name] Primary Care Physician: [name] Other Clinician: Referring Physician: Nolene [name] Treating Physician/Extender: [name] in Treatment: 1 Verbal / Phone Orders: Yes Clinician: Cornell [name] Read Back and Verified: Yes Diagnosis Coding Wound Cleansing Wound #1 Left,Medial Lower Leg o Cleanse wound with mild soap and water o May Shower, gently pat wound dry prior to applying new dressing. o May shower with protection. Primary Wound Dressing Wound #1 Left,Medial Lower Leg o Aquacel Ag Secondary Dressing Wound #1 Left,Medial Lower Leg o Boardered Foam Dressing Dressing Change Frequency Wound #1 Left,Medial Lower Leg o Change dressing every other day. Follow-up Appointments Wound #1 Left,Medial Lower Leg o Return Appointment in 1 week. Edema Control Wound #1 Left,Medial Lower Leg o Patient to wear own compression stockings o Elevate legs to the level of the heart and pump ankles as often as possible o Support Garment 20-30 mm/Hg pressure to: Additional Orders / Instructions Wound #1 Left,Medial Lower Leg o Increase protein intake. o OK to return to work with the following restrictions: o Activity as tolerated [name], [name] ([id number]) Electronic Signature(s) Signed: 12/04/2015 11:43:27 AM By: Valerie Fudge MD, FACS Signed: 12/05/2015 10:50:50 AM By: Valerie Cool RN, BSN, Kim RN, BSN Entered By: Valerie Cool, RN, BSN, Kim on 12/04/2015 09:39:49 [name] ([id number]) -------------------------------------------------------------------------------- Problem List Details Patient Name: [name] Date of Service: 12/04/2015 8:00 AM Medical Record Number: [id number] Patient Account Number: [id number] Date of Birth/Sex: 03-20-1985 (30 y.o. 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 Female) Treating RN: [name] Primary Care Physician: [name] Other  Clinician: Referring Physician: Nolene [name] Treating Physician/Extender: [name] in Treatment: 1 Subjective Chief Complaint Information obtained from Patient Patient presents to the wound care center for a consult due non healing wound left lower extremity which has been there for about a month History of Present Illness (HPI) The following HPI elements were documented for the patient's wound: Location: left lower extremity medially Quality: Patient reports experiencing a sharp pain to affected area(s). Severity: Patient states wound are getting worse. Duration: Patient has had the wound for < 4 weeks  prior to presenting for treatment Timing: Pain in wound is Intermittent (comes and goes Context: The wound occurred when the patient she was bitten by a spider Modifying Factors: Other treatment(s) tried include:antibioticsand local treatment Associated Signs and Symptoms: Patient reports having increase swelling. 30 year old patient to comes to see [place] for a wound on her left lower extremity had for about a month and she believes a spider bit her while she was driving. Past medical history of kidney transplant status, hypertension, anemia, posttransplant diabetes mellitus and hyperlipidemia. his medical history also includes post transplant diabetes mellitus, hypertension, learning disability, previous parathyroidectomy for secondary hyperparathyroidism and she has had a regular follow-up with her kidney doctors. He has never been a smoker. Objective Constitutional Pulse regular. Respirations normal and unlabored. Afebrile. Vitals Time Taken: 9:05 AM, Height: 66 in, Weight: 180 lbs, BMI: 29, Temperature: 98.0 F, Pulse: 92 bpm, Respiratory Rate: 18 breaths/min, Blood Pressure: 142/104 mmHg. General Notes: Patient states she has not taken her blood pressure medications this morning. Patient instructed to take medications asap and recheck BP. If still high patient instructed to followup with PCP. Patient understands and agrees. [name], [name] ([id number]) Eyes Nonicteric. Reactive to light. Ears, Nose, Mouth, and Throat Lips, teeth, and gums WNL.Marland Kitchen Moist mucosa without lesions. Neck supple and nontender. No palpable supraclavicular or cervical adenopathy. Normal sized without goiter. Respiratory WNL. No retractions.. Cardiovascular Pedal Pulses WNL. No clubbing, cyanosis or edema. Lymphatic No adneopathy. No adenopathy. No adenopathy. Musculoskeletal Adexa without tenderness or enlargement.. Digits and nails w/o clubbing, cyanosis, infection, petechiae, ischemia, or inflammatory  conditions.Marland Kitchen Psychiatric Judgement and insight Intact.. No evidence of depression, anxiety, or agitation.. General Notes: wound on the left lower extremity continues to have some minimal debris which was sharply removed with a curette and bleeding controlled with pressure Integumentary (Hair, Skin) No suspicious lesions. No crepitus or fluctuance. No peri-wound warmth or erythema. No masses.. Wound #1 status is Open. Original cause of wound was Bite. The wound is located on the Left,Medial Lower Leg. The wound measures 0.8cm length x 1.2cm width x 0.1cm depth; 0.754cm^2 area and 0.075cm^3 volume. The wound is limited to skin breakdown. There is a large amount of serous drainage noted. The wound margin is distinct with the outline attached to the wound base. There is medium (34-66%) pink granulation within the wound bed. There is a medium (34-66%) amount of necrotic tissue within the wound bed including Adherent Slough. The periwound skin appearance exhibited: Induration, Localized Edema, Moist, Hemosiderin Staining. Periwound temperature was noted as No Abnormality. The periwound has tenderness on palpation. 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  There was a Skin/Subcutaneous Tissue Debridement ([id number]) debridement with total area of 0.96 sq cm performed by Valerie Fudge, MD. with the following instrument(s): Curette to remove Viable and Non-Viable tissue/material including Exudate, Fibrin/Slough, and Subcutaneous after achieving pain control using Other (IDOCIANE 4%). A time out was  conducted at 09:38, prior to the start of the procedure. A Minimum amount of bleeding was controlled with Pressure. The procedure was tolerated well with a pain level of 0 throughout and a pain level of 0 following the procedure. Post Debridement Measurements: 0.8cm length x 1.2cm width x 0.2cm depth; 0.151cm^3 volume. Character of Wound/Ulcer Post Debridement requires further debridement. Severity of Tissue Post Debridement is: Fat layer exposed. Post procedure Diagnosis Wound #1: Same as Pre-Procedure Plan Wound Cleansing: Wound #1 Left,Medial Lower Leg: Cleanse wound with mild soap and water May Shower, gently pat wound dry prior to applying new dressing. May shower with protection. Primary Wound Dressing: Wound #1 Left,Medial Lower Leg: Aquacel Ag Secondary Dressing: Wound #1 Left,Medial Lower Leg: Boardered Foam Dressing Dressing Change Frequency: Wound #1 Left,Medial Lower Leg: Change dressing every other day. Follow-up Appointments: Wound #1 Left,Medial Lower Leg: [name], [name] ([id number]) Return Appointment in 1 week. Edema Control: Wound #1 Left,Medial Lower Leg: Patient to wear own compression stockings Elevate legs to the level of the heart and pump ankles as often as possible Support Garment 20-30 mm/Hg pressure to: Additional Orders / Instructions: Wound #1 Left,Medial Lower Leg: Increase protein intake. OK to return to work with the following restrictions: Activity as tolerated I have recommended: 1. silver alginate and compression stockings of the 20-30 mm variety. not yet bought her stockings 2. elevation and exercise 3. good control of her diabetes mellitus -- last hemoglobin A1c was 11% 4. Arterial and venous duplex studies of both lower extremities -- appointment still pending 5. regular visits to the wound center Electronic Signature(s) Signed: 12/04/2015 9:58:15 AM By: Valerie Fudge MD, FACS Entered By: [name] on 12/04/2015 09:58:15 [name] ([id number]) -------------------------------------------------------------------------------- SuperBill Details Patient Name: [name] Date of Service: 12/04/2015 Medical Record Number: [id number] Patient Account Number: [id number] Date of Birth/Sex: 05/26/1985 (30 y.o. Female) Treating RN: [name] Primary Care Physician: [name] Other Clinician: Referring Physician: Nolene [name] Treating Physician/Extender: [name] in Treatment: 1 Diagnosis Coding ICD-10 Codes Code Description [phone] Other specified diabetes mellitus with other skin ulcer I73.9 Peripheral vascular disease, unspecified I89.0 Lymphedema, not elsewhere classified L97.222 Non-pressure chronic ulcer of left calf with fat layer exposed Z94.0 Kidney transplant status Facility Procedures CPT4 Code: [id number] Description: 70263 - DEB SUBQ TISSUE 20 SQ CM/< ICD-10 Description Diagnosis E13.622 Other specified diabetes mellitus with other skin I73.9 Peripheral vascular disease, unspecified I89.0 Lymphedema, not elsewhere classified L97.222 Non-pressure chronic  ulcer of left calf with fat l Modifier: ulcer ayer exposed Quantity: 1 Physician Procedures CPT4 Code: [id number] Description: 11042 - WC PHYS SUBQ TISS 20 SQ CM ICD-10 Description Diagnosis E13.622 Other specified diabetes mellitus with other skin I73.9 Peripheral vascular disease, unspecified I89.0 Lymphedema, not elsewhere classified L97.222 Non-pressure chronic  ulcer of left calf with fat l Modifier: ulcer ayer exposed Quantity: 1 Electronic Signature(s) Signed: 12/04/2015 9:58:30 AM By: Valerie Fudge MD, FACS Entered By: [name] on 12/04/2015 09:58:29

## 2015-12-09 ENCOUNTER — Other Ambulatory Visit: Payer: Self-pay | Admitting: Surgery

## 2015-12-09 ENCOUNTER — Ambulatory Visit (HOSPITAL_COMMUNITY)
Admission: RE | Admit: 2015-12-09 | Discharge: 2015-12-09 | Disposition: A | Discharge status: Home / Self Care | Payer: Medicare Other | Source: Ambulatory Visit | Attending: Vascular Surgery | Admitting: Vascular Surgery

## 2015-12-09 DIAGNOSIS — I739 Peripheral vascular disease, unspecified: Secondary | ICD-10-CM | POA: Insufficient documentation

## 2015-12-09 DIAGNOSIS — L97929 Non-pressure chronic ulcer of unspecified part of left lower leg with unspecified severity: Secondary | ICD-10-CM | POA: Diagnosis not present

## 2015-12-10 ENCOUNTER — Other Ambulatory Visit: Payer: Self-pay | Admitting: Surgery

## 2015-12-10 ENCOUNTER — Ambulatory Visit (HOSPITAL_COMMUNITY)
Admission: RE | Admit: 2015-12-10 | Discharge: 2015-12-10 | Disposition: A | Discharge status: Home / Self Care | Payer: Medicare Other | Source: Ambulatory Visit | Attending: Vascular Surgery | Admitting: Vascular Surgery

## 2015-12-10 DIAGNOSIS — T148XXA Other injury of unspecified body region, initial encounter: Secondary | ICD-10-CM

## 2015-12-10 DIAGNOSIS — L97929 Non-pressure chronic ulcer of unspecified part of left lower leg with unspecified severity: Secondary | ICD-10-CM | POA: Insufficient documentation

## 2015-12-10 DIAGNOSIS — T148 Other injury of unspecified body region: Secondary | ICD-10-CM

## 2015-12-10 LAB — VAS US LOWER EXTREMITY ARTERIAL DUPLEX
LPERODISTSYS: -55 cm/s
LPTIBDISTSYS: 89 cm/s
LSFDPSV: -80 cm/s
Left ant tibial distal sys: 64 cm/s
Left popliteal prox sys PSV: -70 cm/s
Left super femoral mid sys PSV: -84 cm/s
Left super femoral prox sys PSV: 87 cm/s

## 2015-12-11 ENCOUNTER — Encounter (HOSPITAL_COMMUNITY)
Admission: RE | Admit: 2015-12-11 | Discharge: 2015-12-11 | Disposition: A | Discharge status: Home / Self Care | Payer: Medicare Other | Source: Ambulatory Visit | Attending: Nephrology | Admitting: Nephrology

## 2015-12-11 ENCOUNTER — Encounter: Payer: Medicare Other | Admitting: Surgery

## 2015-12-11 DIAGNOSIS — I890 Lymphedema, not elsewhere classified: Secondary | ICD-10-CM | POA: Diagnosis not present

## 2015-12-11 DIAGNOSIS — I10 Essential (primary) hypertension: Secondary | ICD-10-CM | POA: Diagnosis not present

## 2015-12-11 DIAGNOSIS — D631 Anemia in chronic kidney disease: Secondary | ICD-10-CM | POA: Diagnosis not present

## 2015-12-11 DIAGNOSIS — Z940 Kidney transplant status: Secondary | ICD-10-CM | POA: Diagnosis not present

## 2015-12-11 DIAGNOSIS — L97822 Non-pressure chronic ulcer of other part of left lower leg with fat layer exposed: Secondary | ICD-10-CM | POA: Diagnosis not present

## 2015-12-11 DIAGNOSIS — N183 Chronic kidney disease, stage 3 (moderate): Secondary | ICD-10-CM | POA: Diagnosis not present

## 2015-12-11 DIAGNOSIS — I739 Peripheral vascular disease, unspecified: Secondary | ICD-10-CM | POA: Diagnosis not present

## 2015-12-11 DIAGNOSIS — N182 Chronic kidney disease, stage 2 (mild): Secondary | ICD-10-CM

## 2015-12-11 DIAGNOSIS — L97222 Non-pressure chronic ulcer of left calf with fat layer exposed: Secondary | ICD-10-CM | POA: Diagnosis not present

## 2015-12-11 DIAGNOSIS — E11622 Type 2 diabetes mellitus with other skin ulcer: Secondary | ICD-10-CM | POA: Diagnosis not present

## 2015-12-11 LAB — POCT HEMOGLOBIN-HEMACUE: Hemoglobin: 10.6 g/dL — ABNORMAL LOW (ref 12.0–15.0)

## 2015-12-11 LAB — IRON AND TIBC
IRON: 47 ug/dL (ref 28–170)
SATURATION RATIOS: 21 % (ref 10.4–31.8)
TIBC: 224 ug/dL — AB (ref 250–450)
UIBC: 177 ug/dL

## 2015-12-11 LAB — FERRITIN: Ferritin: 985 ng/mL — ABNORMAL HIGH (ref 11–307)

## 2015-12-11 MED ORDER — EPOETIN ALFA 10000 UNIT/ML IJ SOLN
INTRAMUSCULAR | Status: AC
  Administered 2015-12-11: 10000 [IU]
  Filled 2015-12-11: qty 1

## 2015-12-11 MED ORDER — EPOETIN ALFA 20000 UNIT/ML IJ SOLN
INTRAMUSCULAR | Status: AC
  Administered 2015-12-11: 20000 [IU]
  Filled 2015-12-11: qty 1

## 2015-12-11 MED ORDER — EPOETIN ALFA 40000 UNIT/ML IJ SOLN: 30000.0000 [IU] | INTRAMUSCULAR | Status: DC

## 2015-12-12 NOTE — Progress Notes (Signed)
MODINE, OPPENHEIMER ([id number]) Visit Report for 12/11/2015 Arrival Information Details Patient Name: Valerie, [name] Date of Service: 12/11/2015 12:45 PM Medical Record Number: [id number] Patient Account Number: [id number] Date of Birth/Sex: 30-May-1985 (30 y.o. Female) Treating RN: Carolyne Fiscal, Debi Primary Care Physician: [name] Other Clinician: Referring Physician: [name] Treating Physician/Extender: Frann Rider in Treatment: 2 Visit Information History Since Last Visit All ordered tests and consults were completed: No Patient Arrived: Ambulatory Added or deleted any medications: No Arrival Time: 12:58 Any new allergies or adverse reactions: No Accompanied By: self Had a fall or experienced change in No Transfer Assistance: None activities of daily living that may affect Patient Identification Verified: Yes risk of falls: Secondary Verification Process Yes Signs or symptoms of abuse/neglect since last No Completed: visito Patient Requires Transmission- No Hospitalized since last visit: No Based Precautions: Pain Present Now: No Patient Has Alerts: Yes Patient Alerts: DM II PT REFUSES FACE PHOTO Electronic Signature(s) Signed: 12/11/2015 4:20:58 PM By: Alric Quan Entered By: Alric Quan on 12/11/2015 12:59:29 [name] ([id number]) -------------------------------------------------------------------------------- Encounter Discharge Information Details Patient Name: [name] Date of Service: 12/11/2015 12:45 PM Medical Record Number: [id number] Patient Account Number: [id number] Date of Birth/Sex: March 04, 1986 (30 y.o. Female) Treating RN: Carolyne Fiscal, Debi Primary Care Physician: [name] Other Clinician: Referring Physician: [name] Treating Physician/Extender: Frann Rider in Treatment: 2 Encounter Discharge Information Items Discharge Pain Level: 0 Discharge Condition: Stable Ambulatory Status:  Ambulatory Discharge Destination: Home Transportation: Private Auto Accompanied By: self Schedule Follow-up Appointment: Yes Medication Reconciliation completed and provided to Patient/Care Yes [name]: Provided on Clinical Summary of Care: 12/11/2015 Form Type Recipient Paper Patient DS Electronic Signature(s) Signed: 12/11/2015 1:13:55 PM By: Ruthine Dose Entered By: Ruthine Dose on 12/11/2015 13:13:55 [name] ([id number]) -------------------------------------------------------------------------------- Lower Extremity Assessment Details Patient Name: [name] Date of Service: 12/11/2015 12:45 PM Medical Record Number: [id number] Patient Account Number: [id number] Date of Birth/Sex: 1985/05/05 (30 y.o. Female) Treating RN: Carolyne Fiscal, Debi Primary Care Physician: [name] Other Clinician: Referring Physician: [name] Treating Physician/Extender: Frann Rider in Treatment: 2 Vascular Assessment Pulses: Posterior Tibial Dorsalis Pedis Palpable: [Left:Yes] Extremity colors, hair growth, and conditions: Extremity Color: [Left:Normal] Hair Growth on Extremity: [Left:Yes] Temperature of Extremity: [Left:Warm] Capillary Refill: [Left:< 3 seconds] Dependent Rubor: [Left:No] Blanched when Elevated: [Left:No] Lipodermatosclerosis: [Left:No] Toe Nail Assessment Left: Right: Thick: No Discolored: No Deformed: No Improper Length and Hygiene: No Electronic Signature(s) Signed: 12/11/2015 4:20:58 PM By: Alric Quan Entered By: Alric Quan on 12/11/2015 13:02:16 [name] ([id number]) -------------------------------------------------------------------------------- Multi Wound Chart Details Patient Name: [name] Date of Service: 12/11/2015 12:45 PM Medical Record Number: [id number] Patient Account Number: [id number] Date of Birth/Sex: 1985/03/24 (30 y.o. Female) Treating RN: Carolyne Fiscal, Debi Primary Care Physician: [name] Other Clinician: Referring Physician: [name] Treating Physician/Extender: Frann Rider in Treatment: 2 Vital Signs Height(in): 66 Pulse(bpm): 110 Weight(lbs): 180 Blood Pressure 95/64 (mmHg): Body Mass Index(BMI): 29 Temperature(F): 97.9 Respiratory Rate 18 (breaths/min): Photos: [1:No Photos] [N/A:N/A] Wound Location: [1:Left Lower Leg - Medial] [N/A:N/A] Wounding Event: [1:Bite] [N/A:N/A] Primary Etiology: [1:Diabetic Wound/Ulcer of the Lower Extremity] [N/A:N/A] Comorbid History: [1:Anemia, Hypertension, Type II Diabetes, Osteoarthritis, Seizure Disorder] [N/A:N/A] Date Acquired: [1:10/17/2015] [N/A:N/A] Weeks of Treatment: [1:2] [N/A:N/A] Wound Status: [1:Open] [N/A:N/A] Measurements L x W x D 0.8x1.2x0.1 [N/A:N/A] (cm) Area (cm) : [1:0.754] [N/A:N/A] Volume (cm) : [1:0.075] [N/A:N/A] % Reduction in Area: [1:23.80%] [N/A:N/A] % Reduction in Volume: 24.20% [N/A:N/A] Classification: [1:Grade 1] [N/A:N/A] Exudate Amount: [  1:Large] [N/A:N/A] Exudate Type: [1:Serous] [N/A:N/A] Exudate Color: [1:amber] [N/A:N/A] Wound Margin: [1:Distinct, outline attached] [N/A:N/A] Granulation Amount: [1:Medium (34-66%)] [N/A:N/A] Granulation Quality: [1:Pink] [N/A:N/A] Necrotic Amount: [1:Medium (34-66%)] [N/A:N/A] Exposed Structures: [1:Fascia: No Fat: No Tendon: No Muscle: No] [N/A:N/A] Joint: No Bone: No Limited to Skin Breakdown Epithelialization: None N/A N/A Periwound Skin Texture: Edema: Yes N/A N/A Induration: Yes Periwound Skin Moist: Yes N/A N/A Moisture: Periwound Skin Color: Hemosiderin Staining: Yes N/A N/A Temperature: No Abnormality N/A N/A Tenderness on Yes N/A N/A Palpation: Wound Preparation: Ulcer Cleansing: N/A N/A Rinsed/Irrigated with Saline Topical Anesthetic Applied: Other: lidocaine 4% Treatment Notes Electronic Signature(s) Signed: 12/11/2015 4:20:58 PM By: Alric Quan Entered By: Alric Quan on 12/11/2015  13:07:12 [name] ([id number]) -------------------------------------------------------------------------------- [place] Details Patient Name: [name] Date of Service: 12/11/2015 12:45 PM Medical Record Number: [id number] Patient Account Number: [id number] Date of Birth/Sex: 1985/06/18 (30 y.o. Female) Treating RN: Carolyne Fiscal, Debi Primary Care Physician: [name] Other Clinician: Referring Physician: [name] Treating Physician/Extender: Frann Rider in Treatment: 2 Active Inactive Orientation to the Wound Care Program Nursing Diagnoses: Knowledge deficit related to the wound healing center program Goals: Patient/caregiver will verbalize understanding of the [facility] Program Date Initiated: 11/27/2015 Goal Status: Active Interventions: Provide education on orientation to the wound center Notes: Wound/Skin Impairment Nursing Diagnoses: Impaired tissue integrity Knowledge deficit related to ulceration/compromised skin integrity Goals: Patient/caregiver will verbalize understanding of skin care regimen Date Initiated: 11/27/2015 Goal Status: Active Ulcer/skin breakdown will have a volume reduction of 30% by week 4 Date Initiated: 11/27/2015 Goal Status: Active Ulcer/skin breakdown will have a volume reduction of 80% by week 12 Date Initiated: 11/27/2015 Goal Status: Active Ulcer/skin breakdown will heal within 14 weeks Date Initiated: 11/27/2015 Goal Status: Active Interventions: Assess patient/caregiver ability to obtain necessary supplies NERA, HAWORTH ([id number]) Assess patient/caregiver ability to perform ulcer/skin care regimen upon admission and as needed Provide education on ulcer and skin care Treatment Activities: Skin care regimen initiated : 11/27/2015 Topical wound management initiated : 11/27/2015 Notes: Electronic Signature(s) Signed: 12/11/2015 4:20:58 PM By: Alric Quan Entered By:  Alric Quan on 12/11/2015 13:07:05 [name] ([id number]) -------------------------------------------------------------------------------- Pain Assessment Details Patient Name: [name] Date of Service: 12/11/2015 12:45 PM Medical Record Number: [id number] Patient Account Number: [id number] Date of Birth/Sex: Aug 30, 1985 (30 y.o. Female) Treating RN: Carolyne Fiscal, Debi Primary Care Physician: [name] Other Clinician: Referring Physician: [name] Treating Physician/Extender: Frann Rider in Treatment: 2 Active Problems Location of Pain Severity and Description of Pain Patient Has Paino No Site Locations With Dressing Change: No Pain Management and Medication Current Pain Management: Electronic Signature(s) Signed: 12/11/2015 4:20:58 PM By: Alric Quan Entered By: Alric Quan on 12/11/2015 12:59:37 [name] ([id number]) -------------------------------------------------------------------------------- Patient/Caregiver Education Details Patient Name: [name] Date of Service: 12/11/2015 12:45 PM Medical Record Number: [id number] Patient Account Number: [id number] Date of Birth/Gender: May 13, 1985 (30 y.o. Female) Treating RN: Carolyne Fiscal, Debi Primary Care Physician: [name] Other Clinician: Referring Physician: [name] Treating Physician/Extender: Frann Rider in Treatment: 2 Education Assessment Education Provided To: Patient Education Topics Provided Wound/Skin Impairment: Handouts: Caring for Your Ulcer, Other: wound care as prescribed Methods: Demonstration Responses: State content correctly Electronic Signature(s) Signed: 12/11/2015 4:20:58 PM By: Alric Quan Entered By: Alric Quan on 12/11/2015 13:13:47 [name] ([id number]) -------------------------------------------------------------------------------- Wound Assessment Details Patient Name: [name] Date of Service:  12/11/2015 12:45 PM Medical Record Number: [id number] Patient Account Number: [id number] Date of Birth/Sex: 14-Oct-1985 (  30 y.o. Female) Treating RN: Carolyne Fiscal, Debi Primary Care Physician: [name] Other Clinician: Referring Physician: [name] Treating Physician/Extender: Frann Rider in Treatment: 2 Wound Status Wound Number: 1 Primary Diabetic Wound/Ulcer of the Lower Etiology: Extremity Wound Location: Left Lower Leg - Medial Wound Open Wounding Event: Bite Status: Date Acquired: 10/17/2015 Comorbid Anemia, Hypertension, Type II Weeks Of Treatment: 2 History: Diabetes, Osteoarthritis, Seizure Clustered Wound: No Disorder Photos Photo Uploaded By: Alric Quan on 12/11/2015 15:53:17 Wound Measurements Length: (cm) 0.8 Width: (cm) 1.2 Depth: (cm) 0.1 Area: (cm) 0.754 Volume: (cm) 0.075 % Reduction in Area: 23.8% % Reduction in Volume: 24.2% Epithelialization: None Tunneling: No Undermining: No Wound Description Classification: Grade 1 Foul Odor Af Wound Margin: Distinct, outline attached Exudate Amount: Large Exudate Type: Serous Exudate Color: amber ter Cleansing: No Wound Bed Granulation Amount: Medium (34-66%) Exposed Structure Granulation Quality: Pink Fascia Exposed: No Necrotic Amount: Medium (34-66%) Fat Layer Exposed: No Necrotic Quality: Adherent Slough Tendon Exposed: No Valerie, [name] ([id number]) Muscle Exposed: No Joint Exposed: No Bone Exposed: No Limited to Skin Breakdown Periwound Skin Texture Texture Color No Abnormalities Noted: No No Abnormalities Noted: No Induration: Yes Hemosiderin Staining: Yes Localized Edema: Yes Temperature / Pain Moisture Temperature: No Abnormality No Abnormalities Noted: No Tenderness on Palpation: Yes Moist: Yes Wound Preparation Ulcer Cleansing: Rinsed/Irrigated with Saline Topical Anesthetic Applied: Other: lidocaine 4%, Treatment Notes Wound #1 (Left, Medial Lower Leg) 1.  Cleansed with: Clean wound with Normal Saline 2. Anesthetic Topical Lidocaine 4% cream to wound bed prior to debridement 4. Dressing Applied: Aquacel Ag 5. Secondary Dressing Applied Bordered Foam Dressing Electronic Signature(s) Signed: 12/11/2015 4:20:58 PM By: Alric Quan Entered By: Alric Quan on 12/11/2015 13:03:22 [name] ([id number]) -------------------------------------------------------------------------------- Vitals Details Patient Name: [name] Date of Service: 12/11/2015 12:45 PM Medical Record Number: [id number] Patient Account Number: [id number] Date of Birth/Sex: [birth year] (30 y.o. Female) Treating RN: Carolyne Fiscal, Debi Primary Care Physician: [name] Other Clinician: Referring Physician: [name] Treating Physician/Extender: Frann Rider in Treatment: 2 Vital Signs Time Taken: 12:59 Temperature (F): 97.9 Height (in): 66 Pulse (bpm): 110 Weight (lbs): 180 Respiratory Rate (breaths/min): 18 Body Mass Index (BMI): 29 Blood Pressure (mmHg): 95/64 Reference Range: 80 - 120 mg / dl Electronic Signature(s) Signed: 12/11/2015 4:20:58 PM By: Alric Quan Entered By: Alric Quan on 12/11/2015 13:01:33

## 2015-12-12 NOTE — Progress Notes (Addendum)
REVER, PICHETTE ([id number]) Visit Report for 12/11/2015 Debridement Details Patient Name: [name], [name] 12/11/2015 12:45 Date of Service: PM Medical Record [id number] Number: Patient Account Number: [id number] [birth year] (30 y.o. Treating RN: Ahmed Prima Date of Birth/Sex: Female) Other Clinician: Primary Care Physician: Jeanie Cooks, EDWIN Treating [name] Referring Physician: [name] Physician/Extender: Suella Grove in Treatment: 2 Debridement Performed for Wound #1 Left,Medial Lower Leg Assessment: Performed By: Physician Christin Fudge, MD Debridement: Debridement Pre-procedure Yes - 13:05 Verification/Time Out Taken: Start Time: 13:06 Pain Control: Other : lidocaine 4% Level: Skin/Subcutaneous Tissue Total Area Debrided (L x 0.8 (cm) x 1.2 (cm) = 0.96 (cm) W): Tissue and other Viable, Exudate, Fibrin/Slough, Subcutaneous material debrided: Instrument: Curette Bleeding: Minimum Hemostasis Achieved: Pressure End Time: 13:08 Procedural Pain: 0 Post Procedural Pain: 0 Response to Treatment: Procedure was tolerated well Post Debridement Measurements of Total Wound Length: (cm) 0.8 Width: (cm) 1.2 Depth: (cm) 0.1 Volume: (cm) 0.075 Character of Wound/Ulcer Post Requires Further Debridement Debridement: Severity of Tissue Post Debridement: Fat layer exposed Post Procedure Diagnosis Same as Pre-procedure CATIE, CHIAO ([id number]) Electronic Signature(s) Signed: 12/11/2015 4:20:58 PM By: Alric Quan Signed: 12/11/2015 4:25:16 PM By: Christin Fudge MD, FACS Entered By: Alric Quan on 12/11/2015 13:08:34 [name] ([id number]) -------------------------------------------------------------------------------- HPI Details Patient Name: [name], [name] 12/11/2015 12:45 Date of Service: PM Medical Record [id number] Number: Patient Account Number: [id number] May 04, 1985 (30 y.o. Treating RN: Ahmed Prima Date of Birth/Sex: Female) Other  Clinician: Primary Care Physician: Jeanie Cooks, EDWIN Treating [name] Referring Physician: [name] Physician/Extender: Weeks in Treatment: 2 History of Present Illness Location: left lower extremity medially Quality: Patient reports experiencing a sharp pain to affected area(s). Severity: Patient states wound are getting worse. Duration: Patient has had the wound for < 4 weeks prior to presenting for treatment Timing: Pain in wound is Intermittent (comes and goes Context: The wound occurred when the patient she was bitten by a spider Modifying Factors: Other treatment(s) tried include:antibioticsand local treatment Associated Signs and Symptoms: Patient reports having increase swelling. HPI Description: 30 year old patient to comes to see [place] for a wound on her left lower extremity had for about a month and she believes a spider bit her while she was driving. Past medical history of kidney transplant status, hypertension, anemia, posttransplant diabetes mellitus and hyperlipidemia. his medical history also includes post transplant diabetes mellitus, hypertension, learning disability, previous parathyroidectomy for secondary hyperparathyroidism and she has had a regular follow-up with her kidney doctors. He has never been a smoker. 12/11/2015 -- venous duplex reflux evaluation done on 12/09/2015 showed no evidence of left lower extremity deep vein thrombosis. There is reflux in the left common femoral vein and femoral vein. There is reflux in the left saphenofemoral junction and the proximal great saphenous vein and in the greater saphenous vein mid calf area. No reflux in the left small saphenous vein. The report was read by Dr. Sherren Mocha Early who said no further vascular workup indicated at this time. the arterial duplex evaluation revealed the was no hemodynamically significant stenosis of the left lower extremity arteries without plaque visualized in the left ABI was 1.07 on the  right ABI was 1.12 Electronic Signature(s) Signed: 12/11/2015 1:15:41 PM By: Christin Fudge MD, FACS Previous Signature: 12/11/2015 1:04:12 PM Version By: Christin Fudge MD, FACS Entered By: Christin Fudge on 12/11/2015 13:15:41 [name] ([id number]) -------------------------------------------------------------------------------- Physical Exam Details Patient Name: [name], [name] 12/11/2015 12:45 Date of Service: PM Medical Record [id number] Number: Patient Account Number: [id number] Oct 19, 1985 (30  y.o. Treating RN: Carolyne Fiscal, Debi Date of Birth/Sex: Female) Other Clinician: Primary Care Physician: Jeanie Cooks, EDWIN Treating Kaydon Creedon Referring Physician: [name] Physician/Extender: Weeks in Treatment: 2 Constitutional . Pulse regular. Respirations normal and unlabored. Afebrile. . Eyes Nonicteric. Reactive to light. Ears, Nose, Mouth, and Throat Lips, teeth, and gums WNL.Marland Kitchen Moist mucosa without lesions. Neck supple and nontender. No palpable supraclavicular or cervical adenopathy. Normal sized without goiter. Respiratory WNL. No retractions.. Cardiovascular Pedal Pulses WNL. No clubbing, cyanosis or edema. Lymphatic No adneopathy. No adenopathy. No adenopathy. Musculoskeletal Adexa without tenderness or enlargement.. Digits and nails w/o clubbing, cyanosis, infection, petechiae, ischemia, or inflammatory conditions.. Integumentary (Hair, Skin) No suspicious lesions. No crepitus or fluctuance. No peri-wound warmth or erythema. No masses.Marland Kitchen Psychiatric Judgement and insight Intact.. No evidence of depression, anxiety, or agitation.. Notes wound of the left lower extremity continues to have minimal amount of debris which was sharply dated with a #3 curet and minimal bleeding controlled with pressure Electronic Signature(s) Signed: 12/11/2015 1:16:25 PM By: Christin Fudge MD, FACS Entered By: Christin Fudge on 12/11/2015 13:16:24 [name]  ([id number]) -------------------------------------------------------------------------------- Physician Orders Details Patient Name: [name], [name] 12/11/2015 12:45 Date of Service: PM Medical Record [id number] Number: Patient Account Number: [id number] 1986/01/11 (30 y.o. Treating RN: Ahmed Prima Date of Birth/Sex: Female) Other Clinician: Primary Care Physician: Jeanie Cooks, EDWIN Treating Britian Jentz Referring Physician: [name] Physician/Extender: Suella Grove in Treatment: 2 Verbal / Phone Orders: Yes ClinicianCarolyne Fiscal, Debi Read Back and Verified: Yes Diagnosis Coding Wound Cleansing Wound #1 Left,Medial Lower Leg o Cleanse wound with mild soap and water o May Shower, gently pat wound dry prior to applying new dressing. o May shower with protection. Primary Wound Dressing Wound #1 Left,Medial Lower Leg o Aquacel Ag Secondary Dressing Wound #1 Left,Medial Lower Leg o Boardered Foam Dressing Dressing Change Frequency Wound #1 Left,Medial Lower Leg o Change dressing every other day. Follow-up Appointments Wound #1 Left,Medial Lower Leg o Return Appointment in 1 week. Edema Control Wound #1 Left,Medial Lower Leg o Patient to wear own compression stockings o Elevate legs to the level of the heart and pump ankles as often as possible o Support Garment 20-30 mm/Hg pressure to: Additional Orders / Instructions Wound #1 Left,Medial Lower Leg o Increase protein intake. o OK to return to work with the following restrictions: o Activity as tolerated KYNLEA, BLACKSTON ([id number]) Electronic Signature(s) Signed: 12/11/2015 4:20:58 PM By: Alric Quan Signed: 12/11/2015 4:25:16 PM By: Christin Fudge MD, FACS Entered By: Alric Quan on 12/11/2015 13:09:26 [name] ([id number]) -------------------------------------------------------------------------------- Problem List Details Patient Name: [name], [name] 12/11/2015 12:45 Date of  Service: PM Medical Record [id number] Number: Patient Account Number: [id number] 1985/06/20 (30 y.o. 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 Z94.0 Kidney transplant status 11/27/2015 Yes 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 Treating RN: Ahmed Prima Date of Birth/Sex: Female) Other Clinician: Primary Care Physician: Jeanie Cooks, EDWIN Treating Jammal Sarr Referring Physician: [name] Physician/Extender: Weeks in Treatment: 2 Subjective History of Present Illness (HPI) The following HPI elements were documented for the patient's wound: Location: left lower extremity medially Quality: Patient reports experiencing a sharp pain to affected area(s). Severity: Patient states wound are getting worse. Duration: Patient has had the wound for < 4 weeks prior to presenting for treatment Timing: Pain in wound is Intermittent (comes and goes Context: The wound occurred  when the patient she was bitten by a spider Modifying Factors: Other treatment(s) tried include:antibioticsand local treatment Associated Signs and Symptoms: Patient reports having increase swelling. 30 year old patient to comes to see [place] for a wound on her left lower extremity had for about a month and she believes a spider bit her while she was driving. Past medical history of kidney transplant status, hypertension, anemia, posttransplant diabetes mellitus and hyperlipidemia. his medical history also includes post transplant diabetes mellitus, hypertension, learning disability, previous parathyroidectomy for secondary hyperparathyroidism and she has had a regular follow-up with her kidney doctors. He has never been a smoker. 12/11/2015 -- venous duplex reflux evaluation done on 12/09/2015 showed no evidence of left lower extremity deep vein thrombosis. There is reflux in the left common femoral vein and femoral vein. There is reflux in the left saphenofemoral junction and the proximal great saphenous vein and in the greater saphenous vein mid calf area. No reflux in the left small saphenous vein. The report was read by Dr. Sherren Mocha Early who said no further vascular workup indicated at this time. the arterial duplex evaluation revealed the was no hemodynamically significant stenosis of the left lower extremity arteries without plaque visualized in the left ABI was 1.07 on the right ABI was 1.12 Objective Constitutional Pulse regular. Respirations normal and unlabored. Afebrile. Vitals Time Taken: 12:59 PM, Height: 66 in, Weight: 180 lbs, BMI: 29, Temperature: 97.9 F, Pulse: 110 Yeo, Janeth S. ([id number]) bpm, Respiratory Rate: 18 breaths/min, Blood Pressure: 95/64 mmHg. Eyes Nonicteric. Reactive to light. Ears, Nose, Mouth, and Throat Lips, teeth, and gums WNL.Marland Kitchen Moist mucosa without lesions. Neck supple and nontender. No palpable supraclavicular or cervical adenopathy. Normal sized  without goiter. Respiratory WNL. No retractions.. Cardiovascular Pedal Pulses WNL. No clubbing, cyanosis or edema. Lymphatic No adneopathy. No adenopathy. No adenopathy. Musculoskeletal Adexa without tenderness or enlargement.. Digits and nails w/o clubbing, cyanosis, infection, petechiae, ischemia, or inflammatory conditions.Marland Kitchen Psychiatric Judgement and insight Intact.. No evidence of depression, anxiety, or agitation.. General Notes: wound of the left lower extremity continues to have minimal amount of debris which was sharply dated with a #3 curet and minimal bleeding controlled with pressure Integumentary (Hair, Skin) No suspicious lesions. No crepitus or fluctuance. No peri-wound warmth or erythema. No masses.. Wound #1 status is Open. Original cause of wound was Bite. The wound is located on the Left,Medial Lower Leg. The wound measures 0.8cm length x 1.2cm width x 0.1cm depth; 0.754cm^2 area and 0.075cm^3 volume. The wound is limited to skin breakdown. There is no tunneling or undermining noted. There is a large amount of serous drainage noted. The wound margin is distinct with the outline attached to the wound base. There is medium (34-66%) pink granulation within the wound bed. There is a medium (34- 66%) amount of necrotic tissue within the wound bed including Adherent Slough. The periwound skin appearance exhibited: Induration, Localized Edema, Moist, Hemosiderin Staining. Periwound temperature  was noted as No Abnormality. The periwound has tenderness on palpation. 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 There was a Skin/Subcutaneous Tissue Debridement ([id number]) debridement with total area of 0.96 sq cm performed by Christin Fudge, MD. with the following instrument(s): Curette to remove Viable tissue/material including Exudate, Fibrin/Slough, and Subcutaneous after achieving pain control using Other (lidocaine 4%). A time out was conducted at 13:05, prior to the start of the procedure. A Minimum amount of bleeding was controlled with Pressure. The procedure was tolerated well with a pain level of 0 throughout and a pain level of 0 following the procedure. Post Debridement Measurements: 0.8cm length x 1.2cm width x 0.1cm depth; 0.075cm^3 volume. Character of Wound/Ulcer Post Debridement requires further debridement. Severity of Tissue Post Debridement is: Fat layer exposed. Post procedure Diagnosis Wound #1: Same as Pre-Procedure Plan Wound Cleansing: Wound #1 Left,Medial Lower Leg: Cleanse wound with mild soap and water May Shower, gently pat wound dry prior to applying new dressing. May shower with protection. Primary Wound Dressing: Wound #1 Left,Medial Lower Leg: Aquacel Ag Secondary Dressing: Wound #1 Left,Medial Lower Leg: Boardered Foam Dressing Dressing Change Frequency: Wound #1 Left,Medial Lower Leg: [name], KOMATSU. ([id number]) Change dressing every other day. Follow-up Appointments: Wound #1 Left,Medial Lower Leg: Return Appointment in 1 week. Edema Control: Wound #1 Left,Medial Lower Leg: Patient to wear own compression stockings Elevate legs to the level of the heart and pump ankles as often as possible Support Garment 20-30 mm/Hg pressure to: Additional Orders / Instructions: Wound #1 Left,Medial Lower Leg: Increase protein intake. OK to return to work with the following restrictions: Activity as tolerated Reviewed the patient's arterial and venous duplex  studies with her and note that though there are some short segments of varicosities on the left lower extremity Dr. Donnetta Hutching has recommended no vascular intervention at this stage. At this stage I have recommended: 1. silver alginate and compression stockings of the 20-30 mm variety. She has not yet received her stockings. 2. elevation and exercise 3. good control of her diabetes mellitus -- last hemoglobin A1c was 11% 4. Arterial and venous duplex studies of both lower extremities, reports discussed with her as above 5. regular visits to the wound center if the patient does not make adequate improvement with her compression stockings she may need a 3 layer of 4 layer compression wrap to be applied to hasten the healing process. At the present time she is not very compliant and I do not think she will be able to tolerate compression wraps for a week Electronic Signature(s) Signed: 12/11/2015 1:22:20 PM By: Christin Fudge MD, FACS Previous Signature: 12/11/2015 1:20:47 PM Version By: Christin Fudge MD, FACS Entered By: Christin Fudge on 12/11/2015 13:22:20 [name] ([id number]) -------------------------------------------------------------------------------- SuperBill Details Patient Name: [name] Date of Service: 12/11/2015 Medical Record Number: [id number] Patient Account Number: [id number] Date of Birth/Sex: 28-Jun-1985 (30 y.o. Female) Treating RN: Ahmed Prima Primary Care Physician: [name] Other Clinician: Referring Physician: [name] Treating Physician/Extender: Frann Rider in Treatment: 2 Diagnosis Coding ICD-10 Codes Code Description [phone] Other  specified diabetes mellitus with other skin ulcer I73.9 Peripheral vascular disease, unspecified I89.0 Lymphedema, not elsewhere classified L97.222 Non-pressure chronic ulcer of left calf with fat layer exposed Z94.0 Kidney transplant status Facility Procedures CPT4 Code: 61950932 Description: 67124  - DEB SUBQ TISSUE 20 SQ CM/< ICD-10 Description Diagnosis E13.622 Other specified diabetes mellitus with other skin I73.9 Peripheral vascular disease, unspecified I89.0 Lymphedema, not elsewhere classified L97.222 Non-pressure chronic  ulcer of left calf with fat l Modifier: ulcer ayer exposed Quantity: 1 Physician Procedures CPT4 Code: 5809983 Description: 38250 - WC PHYS LEVEL 3 - EST PT ICD-10 Description Diagnosis E13.622 Other specified diabetes mellitus with other skin I73.9 Peripheral vascular disease, unspecified I89.0 Lymphedema, not elsewhere classified L97.222 Non-pressure chronic  ulcer of left calf with fat l Modifier: 25 ulcer ayer exposed Quantity: 1 CPT4 Code: [id number] Smyth, [place] Description: 11042 - WC PHYS SUBQ TISS 20 SQ CM ICD-10 Description Diagnosis E13.622 Other specified diabetes mellitus with other skin I73.9 Peripheral vascular disease, unspecified I89.0 Lymphedema, not elsewhere classified S. ([id number]) Modifier: ulcer Quantity: 1 Electronic Signature(s) Signed: 12/11/2015 1:21:15 PM By: Christin Fudge MD, FACS Entered By: Christin Fudge on 12/11/2015 13:21:15

## 2015-12-16 ENCOUNTER — Telehealth: Payer: Self-pay | Admitting: Endocrinology

## 2015-12-16 MED ORDER — ACCU-CHEK FASTCLIX LANCETS MISC: 4 refills | Status: DC

## 2015-12-16 NOTE — Telephone Encounter (Signed)
I contacted the patient and she stated the only supply she needed sent at this time were the Accu-chek fast click lancets. Patient stated her meter is currently working properly. Refill submitted for the lancets.

## 2015-12-16 NOTE — Telephone Encounter (Signed)
Patient stated her meter broke and need a new one and the lancets.  Please advise

## 2015-12-17 NOTE — Telephone Encounter (Signed)
Pt called back and explained that her machine is not broken, the actual pen that you insert the lancets into is broken.  The pharmacy told her that since you cannot just get a new "pen" filled, to have [place] call a whole new meter into the pharmacy so she can get everything she needs.  She wants a call back before we do it so she can choose which device we send in.

## 2015-12-18 DIAGNOSIS — E1121 Type 2 diabetes mellitus with diabetic nephropathy: Secondary | ICD-10-CM | POA: Diagnosis not present

## 2015-12-18 DIAGNOSIS — I10 Essential (primary) hypertension: Secondary | ICD-10-CM | POA: Diagnosis not present

## 2015-12-18 DIAGNOSIS — S71009A Unspecified open wound, unspecified hip, initial encounter: Secondary | ICD-10-CM | POA: Diagnosis not present

## 2015-12-18 DIAGNOSIS — Z23 Encounter for immunization: Secondary | ICD-10-CM | POA: Diagnosis not present

## 2015-12-19 DIAGNOSIS — Z23 Encounter for immunization: Secondary | ICD-10-CM | POA: Diagnosis not present

## 2015-12-19 MED ORDER — GLUCOSE BLOOD VI STRP: ORAL_STRIP | 5 refills | Status: DC

## 2015-12-19 NOTE — Telephone Encounter (Signed)
I contacted the patient and she requested a new one touch verio meter due to the insurance no longer covering her Accu-chek meter. Patient will come by and pick up the one touch verio meter from our office and a prescription for the test strips have been submitted to [facility].

## 2015-12-24 MED ORDER — ONETOUCH DELICA LANCETS 33G MISC: 2 refills | Status: DC

## 2015-12-24 NOTE — Telephone Encounter (Signed)
Refill submitted per patient's request.  

## 2015-12-24 NOTE — Telephone Encounter (Signed)
The lancets need to be called in to the pharmacy for the pt for the one touch delica call to [employer]

## 2015-12-25 ENCOUNTER — Encounter: Payer: Medicare Other | Attending: General Surgery | Admitting: General Surgery

## 2015-12-25 ENCOUNTER — Encounter (HOSPITAL_COMMUNITY)
Admission: RE | Admit: 2015-12-25 | Discharge: 2015-12-25 | Disposition: A | Discharge status: Home / Self Care | Payer: Medicare Other | Source: Ambulatory Visit | Attending: Nephrology | Admitting: Nephrology

## 2015-12-25 DIAGNOSIS — E11622 Type 2 diabetes mellitus with other skin ulcer: Secondary | ICD-10-CM | POA: Insufficient documentation

## 2015-12-25 DIAGNOSIS — L98499 Non-pressure chronic ulcer of skin of other sites with unspecified severity: Secondary | ICD-10-CM

## 2015-12-25 DIAGNOSIS — K219 Gastro-esophageal reflux disease without esophagitis: Secondary | ICD-10-CM | POA: Insufficient documentation

## 2015-12-25 DIAGNOSIS — L97222 Non-pressure chronic ulcer of left calf with fat layer exposed: Secondary | ICD-10-CM | POA: Insufficient documentation

## 2015-12-25 DIAGNOSIS — M1990 Unspecified osteoarthritis, unspecified site: Secondary | ICD-10-CM | POA: Insufficient documentation

## 2015-12-25 DIAGNOSIS — I739 Peripheral vascular disease, unspecified: Secondary | ICD-10-CM | POA: Diagnosis not present

## 2015-12-25 DIAGNOSIS — D631 Anemia in chronic kidney disease: Secondary | ICD-10-CM | POA: Insufficient documentation

## 2015-12-25 DIAGNOSIS — I83023 Varicose veins of left lower extremity with ulcer of ankle: Secondary | ICD-10-CM | POA: Insufficient documentation

## 2015-12-25 DIAGNOSIS — F419 Anxiety disorder, unspecified: Secondary | ICD-10-CM | POA: Diagnosis not present

## 2015-12-25 DIAGNOSIS — I10 Essential (primary) hypertension: Secondary | ICD-10-CM | POA: Diagnosis not present

## 2015-12-25 DIAGNOSIS — Z794 Long term (current) use of insulin: Secondary | ICD-10-CM | POA: Diagnosis not present

## 2015-12-25 DIAGNOSIS — D649 Anemia, unspecified: Secondary | ICD-10-CM | POA: Insufficient documentation

## 2015-12-25 DIAGNOSIS — F329 Major depressive disorder, single episode, unspecified: Secondary | ICD-10-CM | POA: Insufficient documentation

## 2015-12-25 DIAGNOSIS — E785 Hyperlipidemia, unspecified: Secondary | ICD-10-CM | POA: Diagnosis not present

## 2015-12-25 DIAGNOSIS — I890 Lymphedema, not elsewhere classified: Secondary | ICD-10-CM | POA: Diagnosis not present

## 2015-12-25 DIAGNOSIS — E13622 Other specified diabetes mellitus with other skin ulcer: Secondary | ICD-10-CM | POA: Diagnosis not present

## 2015-12-25 DIAGNOSIS — N183 Chronic kidney disease, stage 3 (moderate): Secondary | ICD-10-CM | POA: Diagnosis not present

## 2015-12-25 DIAGNOSIS — G40909 Epilepsy, unspecified, not intractable, without status epilepticus: Secondary | ICD-10-CM | POA: Insufficient documentation

## 2015-12-25 DIAGNOSIS — N182 Chronic kidney disease, stage 2 (mild): Secondary | ICD-10-CM

## 2015-12-25 DIAGNOSIS — Z940 Kidney transplant status: Secondary | ICD-10-CM | POA: Insufficient documentation

## 2015-12-25 LAB — POCT HEMOGLOBIN-HEMACUE: Hemoglobin: 10.7 g/dL — ABNORMAL LOW (ref 12.0–15.0)

## 2015-12-25 MED ORDER — EPOETIN ALFA 10000 UNIT/ML IJ SOLN
INTRAMUSCULAR | Status: AC
  Administered 2015-12-25: 10000 [IU]
  Filled 2015-12-25: qty 1

## 2015-12-25 MED ORDER — EPOETIN ALFA 40000 UNIT/ML IJ SOLN: 30000.0000 [IU] | INTRAMUSCULAR | Status: DC

## 2015-12-25 MED ORDER — EPOETIN ALFA 20000 UNIT/ML IJ SOLN
INTRAMUSCULAR | Status: AC
  Administered 2015-12-25: 20000 [IU]
  Filled 2015-12-25: qty 1

## 2015-12-25 NOTE — Progress Notes (Signed)
See I heal 

## 2015-12-29 MED ORDER — ACCU-CHEK SOFTCLIX LANCETS MISC: 5 refills | Status: DC

## 2015-12-29 NOTE — Telephone Encounter (Signed)
Refill submitted. 

## 2015-12-29 NOTE — Progress Notes (Signed)
ARIBA, LEHNEN ([id number]) Visit Report for 12/25/2015 Chief Complaint Document Details Patient Name: [name], [name] 12/25/2015 1:30 Date of Service: PM Medical Record [id number] Number: Patient Account Number: [id number] [birth year] (30 y.o. Treating RN: Ahmed Prima Date of Birth/Sex: Female) Other Clinician: Primary Care Physician: Jeanie Cooks, EDWIN Treating Jerline Pain, Mayana Irigoyen Referring Physician: [name] Physician/Extender: Suella Grove in Treatment: 4 Information Obtained from: Patient Chief Complaint Patient presents to the wound care center for a consult due non healing wound left lower extremity which has been there for about a month Electronic Signature(s) Signed: 12/25/2015 3:41:47 PM By: Judene Companion MD Entered By: Judene Companion on 12/25/2015 13:59:54 [name] ([id number]) -------------------------------------------------------------------------------- HPI Details Patient Name: [name], [name] 12/25/2015 1:30 Date of Service: PM Medical Record [id number] Number: Patient Account Number: [id number] March 28, 1985 (30 y.o..o. Treating RN: Ahmed Prima Date of Birth/Sex: Female) Other Clinician: Primary Care Physician: Jeanie Cooks, EDWIN Treating Jerline Pain, Jasmin Winberry Referring Physician: [name] Physician/Extender: Weeks in Treatment: 4 History of Present Illness Location: left lower extremity medially Quality: Patient reports experiencing a sharp pain to affected area(s). Severity: Patient states wound are getting worse. Duration: Patient has had the wound for < 4 weeks prior to presenting for treatment Timing: Pain in wound is Intermittent (comes and goes Context: The wound occurred when the patient she was bitten by a spider Modifying Factors: Other treatment(s) tried include:antibioticsand local treatment Associated Signs and Symptoms: Patient reports having increase swelling. HPI Description: 30 year old patient to comes to see [place] for a wound on her left lower  extremity had for about a month and she believes a spider bit her while she was driving. Past medical history of kidney transplant status, hypertension, anemia, posttransplant diabetes mellitus and hyperlipidemia. his medical history also includes post transplant diabetes mellitus, hypertension, learning disability, previous parathyroidectomy for secondary hyperparathyroidism and she has had a regular follow-up with her kidney doctors. He has never been a smoker. 12/11/2015 -- venous duplex reflux evaluation done on 12/09/2015 showed no evidence of left lower extremity deep vein thrombosis. There is reflux in the left common femoral vein and femoral vein. There is reflux in the left saphenofemoral junction and the proximal great saphenous vein and in the greater saphenous vein mid calf area. No reflux in the left small saphenous vein. The report was read by Dr. Sherren Mocha Early who said no further vascular workup indicated at this time. the arterial duplex evaluation revealed the was no hemodynamically significant stenosis of the left lower extremity arteries without plaque visualized in the left ABI was 1.07 on the right ABI was 1.12 Electronic Signature(s) Signed: 12/25/2015 3:41:47 PM By: Judene Companion MD Entered By: Judene Companion on 12/25/2015 14:00:06 [name] ([id number]) -------------------------------------------------------------------------------- Physical Exam Details Patient Name: [name], [name] 12/25/2015 1:30 Date of Service: PM Medical Record [id number] Number: Patient Account Number: [id number] [birth year] (30 y.o. Treating RN: Ahmed Prima Date of Birth/Sex: Female) Other Clinician: Primary Care Physician: Lavonda Jumbo, Clarissa Laird Referring Physician: [name] Physician/Extender: Suella Grove in Treatment: 4 Electronic Signature(s) Signed: 12/25/2015 3:41:47 PM By: Judene Companion MD Entered By: Judene Companion on 12/25/2015 14:00:16 [name]  ([id number]) -------------------------------------------------------------------------------- Physician Orders Details Patient Name: [name], [name] 12/25/2015 1:30 Date of Service: PM Medical Record [id number] Number: Patient Account Number: [id number] 07-06-1985 (30 y.o. Treating RN: Ahmed Prima Date of Birth/Sex: Female) Other Clinician: Primary Care Physician: Jeanie Cooks, EDWIN Treating Jerline Pain, Robey Massmann Referring Physician: [name] Physician/Extender: Suella Grove in Treatment: 4 Verbal / Phone Orders: Yes ClinicianCarolyne Fiscal, Debi Read Back and Verified:  Yes Diagnosis Coding Wound Cleansing Wound #1 Left,Medial Lower Leg o Cleanse wound with mild soap and water o May Shower, gently pat wound dry prior to applying new dressing. o May shower with protection. Primary Wound Dressing Wound #1 Left,Medial Lower Leg o Aquacel Ag Secondary Dressing Wound #1 Left,Medial Lower Leg o Dry Gauze o Non-adherent pad - coverlet adhesive dressing Dressing Change Frequency Wound #1 Left,Medial Lower Leg o Change dressing every day. - and as needed Follow-up Appointments Wound #1 Left,Medial Lower Leg o Return Appointment in 1 week. Edema Control Wound #1 Left,Medial Lower Leg o Patient to wear own compression stockings o Elevate legs to the level of the heart and pump ankles as often as possible o Support Garment 20-30 mm/Hg pressure to: Additional Orders / Instructions Wound #1 Left,Medial Lower Leg o Increase protein intake. o OK to return to work with the following restrictions: [name] ([id number]) o Activity as tolerated Electronic Signature(s) Signed: 12/25/2015 3:41:47 PM By: Judene Companion MD Signed: 12/29/2015 4:03:46 PM By: Alric Quan Entered By: Alric Quan on 12/25/2015 14:00:06 [name] ([id number]) -------------------------------------------------------------------------------- Problem List Details Patient Name:  [name], [name] 12/25/2015 1:30 Date of Service: PM Medical Record [id number] Number: Patient Account Number: [id number] 1985-10-05 (30 y.o. 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 12/25/2015 1:30 Date of Service: PM Medical Record [id number] Number: Patient Account Number: [id number] 12-21-1985 (30 y.o. Treating RN: Date of Birth/Sex: Female) Other Clinician: Primary Care Physician: [name] Treating Referring Physician: [name] Physician/Extender: Weeks in Treatment: 4 Subjective Chief Complaint Information obtained from Patient Patient presents to the wound care center for a consult due non healing wound left lower extremity which has been there for about a month History of Present Illness (HPI) The following HPI elements were documented for the patient's wound: Location: left lower extremity medially Quality: Patient reports experiencing a sharp pain to affected area(s). Severity: Patient  states wound are getting worse. Duration: Patient has had the wound for < 4 weeks prior to presenting for treatment Timing: Pain in wound is Intermittent (comes and goes Context: The wound occurred when the patient she was bitten by a spider Modifying Factors: Other treatment(s) tried include:antibioticsand local treatment Associated Signs and Symptoms: Patient reports having increase swelling. 30 year old patient to comes to see [place] for a wound on her left lower extremity had for about a month and she believes a spider bit her while she was driving. Past medical history of kidney transplant status, hypertension, anemia, posttransplant diabetes mellitus and hyperlipidemia. his medical history also includes post transplant diabetes mellitus, hypertension, learning disability, previous parathyroidectomy for secondary hyperparathyroidism and she has had a regular follow-up with her kidney doctors. He has never been a smoker. 12/11/2015 -- venous duplex reflux evaluation done on 12/09/2015 showed no evidence of left  lower extremity deep vein thrombosis. There is reflux in the left common femoral vein and femoral vein. There is reflux in the left saphenofemoral junction and the proximal great saphenous vein and in the greater saphenous vein mid calf area. No reflux in the left small saphenous vein. The report was read by Dr. Sherren Mocha Early who said no further vascular workup indicated at this time. the arterial duplex evaluation revealed the was no hemodynamically significant stenosis of the left lower extremity arteries without plaque visualized in the left ABI was 1.07 on the right ABI was 1.12 [name], [name]. ([id number]) Objective Constitutional Vitals Time Taken: 1:42 PM, Height: 66 in, Weight: 180 lbs, BMI: 29, Temperature: 98.2 F, Respiratory Rate: 18 breaths/min. Integumentary (Hair, Skin) Wound #1 status is Open. Original cause of wound was Bite. The wound is located on the  Left,Medial Lower Leg. The wound measures 0.3cm length x 0.4cm width x 0.1cm depth; 0.094cm^2 area and 0.009cm^3 volume. The wound is limited to skin breakdown. There is no tunneling or undermining noted. There is a large amount of serous drainage noted. The wound margin is distinct with the outline attached to the wound base. There is medium (34-66%) pink granulation within the wound bed. There is a medium (34- 66%) amount of necrotic tissue within the wound bed including Adherent Slough. The periwound skin appearance exhibited: Induration, Localized Edema, Moist, Hemosiderin Staining. Periwound temperature was noted as No Abnormality. The periwound has tenderness on palpation. 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 May shower with protection. Primary Wound Dressing: [name], [name] ([id number]) Wound #1 Left,Medial Lower Leg: Aquacel Ag Secondary Dressing: Wound #1 Left,Medial Lower Leg: Dry Gauze Non-adherent pad - coverlet adhesive dressing Dressing Change Frequency: Wound #1 Left,Medial Lower Leg: Change dressing every day. - and as needed Follow-up Appointments: Wound #1 Left,Medial Lower Leg: Return Appointment in 1 week. Edema Control: Wound #1 Left,Medial Lower Leg: Patient to wear own compression stockings Elevate legs to the level of the heart and pump ankles as often as possible Support Garment 20-30 mm/Hg pressure  to: Additional Orders / Instructions: Wound #1 Left,Medial Lower Leg: Increase protein intake. OK to return to work with the following restrictions: Activity as tolerated Electronic Signature(s) Signed: 12/25/2015 3:41:47 PM By: Judene Companion MD Entered By: Judene Companion on 12/25/2015 14:05:07 [name] ([id number]) -------------------------------------------------------------------------------- SuperBill Details Patient Name: [name] Date of Service: 12/25/2015 Medical Record Number: [id number] Patient Account Number: [id number] Date of Birth/Sex: October 23, 1985 (30 y.o. 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 Facility Procedures CPT4 Code: 60737106 Description: 26948 - WOUND CARE VISIT-LEV 3 EST PT Modifier:  Quantity: 1 Physician Procedures CPT4 Code: 5909311 Description: 21624 - WC PHYS LEVEL 3 - EST PT ICD-10 Description Diagnosis E13.622 Other specified diabetes mellitus with other skin I73.9 Peripheral vascular disease, unspecified I89.0 Lymphedema, not elsewhere classified L97.222 Non-pressure chronic  ulcer of left calf with fat Modifier: ulcer layer exposed Quantity: 1 Electronic Signature(s) Signed: 12/29/2015 3:40:17 PM By: Judene Companion MD Previous Signature: 12/25/2015 3:41:47 PM Version By: Judene Companion MD Entered By: Sharon Mt on 12/26/2015 09:24:55

## 2015-12-29 NOTE — Progress Notes (Signed)
[name], [name] ([id number]) Visit Report for 12/25/2015 Arrival Information Details Patient Name: [name], [name] Date of Service: 12/25/2015 1:30 PM Medical Record Number: [id number] Patient Account Number: [id number] Date of Birth/Sex: 10-Apr-1985 (30 y.o. Female) Treating RN: Carolyne Fiscal, Debi Primary Care Physician: [name] Other Clinician: Referring Physician: [name] Treating Physician/Extender: Benjaman Pott in Treatment: 4 Visit Information History Since Last Visit All ordered tests and consults were completed: No Patient Arrived: Ambulatory Added or deleted any medications: No Arrival Time: 13:40 Any new allergies or adverse reactions: No Accompanied By: neice Had a fall or experienced change in No Transfer Assistance: None activities of daily living that may affect Patient Identification Verified: Yes risk of falls: Secondary Verification Process Yes Signs or symptoms of abuse/neglect since last No Completed: visito Patient Requires Transmission- No Hospitalized since last visit: No Based Precautions: Pain Present Now: No Patient Has Alerts: Yes Patient Alerts: DM II PT REFUSES FACE PHOTO Electronic Signature(s) Signed: 12/29/2015 4:03:46 PM By: Alric Quan Entered By: Alric Quan on 12/25/2015 13:41:22 [name] ([id number]) -------------------------------------------------------------------------------- Clinic Level of Care Assessment Details Patient Name: [name] Date of Service: 12/25/2015 1:30 PM Medical Record Number: [id number] Patient Account Number: [id number] Date of Birth/Sex: June 10, 1985 (30 y.o. Female) Treating RN: Carolyne Fiscal, Debi Primary Care Physician: [name] Other Clinician: Referring Physician: [name] Treating Physician/Extender: Benjaman Pott in Treatment: 4 Clinic Level of Care Assessment Items TOOL 4 Quantity Score X - Use when only an EandM is performed on FOLLOW-UP visit 1  0 ASSESSMENTS - Nursing Assessment / Reassessment X - Reassessment of Co-morbidities (includes updates in patient status) 1 10 X - Reassessment of Adherence to Treatment Plan 1 5 ASSESSMENTS - Wound and Skin Assessment / Reassessment X - Simple Wound Assessment / Reassessment - one wound 1 5 []  - Complex Wound Assessment / Reassessment - multiple wounds 0 []  - Dermatologic / Skin Assessment (not related to wound area) 0 ASSESSMENTS - Focused Assessment []  - Circumferential Edema Measurements - multi extremities 0 []  - Nutritional Assessment / Counseling / Intervention 0 []  - Lower Extremity Assessment (monofilament, tuning fork, pulses) 0 []  - Peripheral Arterial Disease Assessment (using hand held doppler) 0 ASSESSMENTS - Ostomy and/or Continence Assessment and Care []  - Incontinence Assessment and Management 0 []  - Ostomy Care Assessment and Management (repouching, etc.) 0 PROCESS - Coordination of Care X - Simple Patient / Family Education for ongoing care 1 15 []  - Complex (extensive) Patient / Family Education for ongoing care 0 X - Staff obtains Programmer, systems, Records, Test Results / Process Orders 1 10 []  - Staff telephones HHA, Nursing Homes / Clarify orders / etc 0 []  - Routine Transfer to another Facility (non-emergent condition) 0 [name], [name] ([id number]) []  - Routine Hospital Admission (non-emergent condition) 0 []  - New Admissions / Biomedical engineer / Ordering NPWT, Apligraf, etc. 0 []  - Emergency Hospital Admission (emergent condition) 0 X - Simple Discharge Coordination 1 10 []  - Complex (extensive) Discharge Coordination 0 PROCESS - Special Needs []  - Pediatric / Minor Patient Management 0 []  - Isolation Patient Management 0 []  - Hearing / Language / Visual special needs 0 []  - Assessment of Community assistance (transportation, D/C planning, etc.) 0 []  - Additional assistance / Altered mentation 0 []  - Support Surface(s) Assessment (bed, cushion, seat, etc.)  0 INTERVENTIONS - Wound Cleansing / Measurement X - Simple Wound Cleansing - one wound 1 5 []  - Complex Wound Cleansing - multiple wounds 0 X - Wound  Imaging (photographs - any number of wounds) 1 5 []  - Wound Tracing (instead of photographs) 0 X - Simple Wound Measurement - one wound 1 5 []  - Complex Wound Measurement - multiple wounds 0 INTERVENTIONS - Wound Dressings X - Small Wound Dressing one or multiple wounds 1 10 []  - Medium Wound Dressing one or multiple wounds 0 []  - Large Wound Dressing one or multiple wounds 0 X - Application of Medications - topical 1 5 []  - Application of Medications - injection 0 INTERVENTIONS - Miscellaneous []  - External ear exam 0 [name], [name] ([id number]) []  - Specimen Collection (cultures, biopsies, blood, body fluids, etc.) 0 []  - Specimen(s) / Culture(s) sent or taken to Lab for analysis 0 []  - Patient Transfer (multiple staff / Harrel Lemon Lift / Similar devices) 0 []  - Simple Staple / Suture removal (25 or less) 0 []  - Complex Staple / Suture removal (26 or more) 0 []  - Hypo / Hyperglycemic Management (close monitor of Blood Glucose) 0 []  - Ankle / Brachial Index (ABI) - do not check if billed separately 0 X - Vital Signs 1 5 Has the patient been seen at the hospital within the last three years: Yes Total Score: 90 Level Of Care: New/Established - Level 3 Electronic Signature(s) Signed: 12/29/2015 4:03:46 PM By: Alric Quan Entered By: Alric Quan on 12/25/2015 16:36:49 [name] ([id number]) -------------------------------------------------------------------------------- Encounter Discharge Information Details Patient Name: [name] Date of Service: 12/25/2015 1:30 PM Medical Record Number: [id number] Patient Account Number: [id number] Date of Birth/Sex: 12-Mar-1986 (30 y.o. Female) Treating RN: Carolyne Fiscal, Debi Primary Care Physician: [name] Other Clinician: Referring Physician: [name] Treating  Physician/Extender: Benjaman Pott in Treatment: 4 Encounter Discharge Information Items Discharge Pain Level: 0 Discharge Condition: Stable Ambulatory Status: Ambulatory Discharge Destination: Home Transportation: Private Auto Accompanied By: neice Schedule Follow-up Appointment: Yes Medication Reconciliation completed and provided to Patient/Care Yes [name]: Provided on Clinical Summary of Care: 12/25/2015 Form Type Recipient Paper Patient DC Electronic Signature(s) Signed: 12/25/2015 3:41:47 PM By: Judene Companion MD Previous Signature: 12/25/2015 2:03:44 PM Version By: Ruthine Dose Entered By: Judene Companion on 12/25/2015 14:05:46 [name] ([id number]) -------------------------------------------------------------------------------- Lower Extremity Assessment Details Patient Name: [name] Date of Service: 12/25/2015 1:30 PM Medical Record Number: [id number] Patient Account Number: [id number] Date of Birth/Sex: 20-Feb-1986 (30 y.o. Female) Treating RN: Carolyne Fiscal, Debi Primary Care Physician: [name] Other Clinician: Referring Physician: [name] Treating Physician/Extender: Benjaman Pott in Treatment: 4 Vascular Assessment Pulses: Posterior Tibial Dorsalis Pedis Palpable: [Left:Yes] Extremity colors, hair growth, and conditions: Extremity Color: [Left:Normal] Temperature of Extremity: [Left:Warm] Capillary Refill: [Left:< 3 seconds] Toe Nail Assessment Left: Right: Thick: No Discolored: No Deformed: No Improper Length and Hygiene: No Electronic Signature(s) Signed: 12/29/2015 4:03:46 PM By: Alric Quan Entered By: Alric Quan on 12/25/2015 13:47:37 [name] ([id number]) -------------------------------------------------------------------------------- Multi Wound Chart Details Patient Name: [name] Date of Service: 12/25/2015 1:30 PM Medical Record Number: [id number] Patient Account Number:  [id number] Date of Birth/Sex: 05-Jun-1985 (30 y.o. Female) Treating RN: Carolyne Fiscal, Debi Primary Care Physician: [name] Other Clinician: Referring Physician: [name] Treating Physician/Extender: Benjaman Pott in Treatment: 4 Vital Signs Height(in): 66 Pulse(bpm): Weight(lbs): 180 Blood Pressure (mmHg): Body Mass Index(BMI): 29 Temperature(F): 98.2 Respiratory Rate 18 (breaths/min): Photos: [1:No Photos] [N/A:N/A] Wound Location: [1:Left Lower Leg - Medial] [N/A:N/A] Wounding Event: [1:Bite] [N/A:N/A] Primary Etiology: [1:Diabetic Wound/Ulcer of the Lower Extremity] [N/A:N/A] Comorbid History: [1:Anemia, Hypertension, Type II Diabetes, Osteoarthritis, Seizure Disorder] [N/A:N/A] Date  Acquired: [1:10/17/2015] [N/A:N/A] Weeks of Treatment: [1:4] [N/A:N/A] Wound Status: [1:Open] [N/A:N/A] Measurements L x W x D 0.3x0.4x0.1 [N/A:N/A] (cm) Area (cm) : [1:0.094] [N/A:N/A] Volume (cm) : [1:0.009] [N/A:N/A] % Reduction in Area: [1:90.50%] [N/A:N/A] % Reduction in Volume: 90.90% [N/A:N/A] Classification: [1:Grade 1] [N/A:N/A] Exudate Amount: [1:Large] [N/A:N/A] Exudate Type: [1:Serous] [N/A:N/A] Exudate Color: [1:amber] [N/A:N/A] Wound Margin: [1:Distinct, outline attached] [N/A:N/A] Granulation Amount: [1:Medium (34-66%)] [N/A:N/A] Granulation Quality: [1:Pink] [N/A:N/A] Necrotic Amount: [1:Medium (34-66%)] [N/A:N/A] Exposed Structures: [1:Fascia: No Fat: No Tendon: No Muscle: No] [N/A:N/A] Joint: No Bone: No Limited to Skin Breakdown Epithelialization: None N/A N/A Periwound Skin Texture: Edema: Yes N/A N/A Induration: Yes Periwound Skin Moist: Yes N/A N/A Moisture: Periwound Skin Color: Hemosiderin Staining: Yes N/A N/A Temperature: No Abnormality N/A N/A Tenderness on Yes N/A N/A Palpation: Wound Preparation: Ulcer Cleansing: N/A N/A Rinsed/Irrigated with Saline Topical Anesthetic Applied: Other: lidocaine 4% Treatment Notes Electronic  Signature(s) Signed: 12/29/2015 4:03:46 PM By: Alric Quan Entered By: Alric Quan on 12/25/2015 13:51:52 [name] ([id number]) -------------------------------------------------------------------------------- [facility] Details Patient Name: [name] Date of Service: 12/25/2015 1:30 PM Medical Record Number: [id number] Patient Account Number: [id number] Date of Birth/Sex: 06-Oct-1985 (30 y.o. Female) Treating RN: Carolyne Fiscal, Debi Primary Care Physician: [name] Other Clinician: Referring Physician: [name] Treating Physician/Extender: Benjaman Pott in Treatment: 4 Active Inactive Orientation to the Wound Care Program Nursing Diagnoses: Knowledge deficit related to the wound healing center program Goals: Patient/caregiver will verbalize understanding of the [facility] Program Date Initiated: 11/27/2015 Goal Status: Active Interventions: Provide education on orientation to the wound center Notes: Wound/Skin Impairment Nursing Diagnoses: Impaired tissue integrity Knowledge deficit related to ulceration/compromised skin integrity Goals: Patient/caregiver will verbalize understanding of skin care regimen Date Initiated: 11/27/2015 Goal Status: Active Ulcer/skin breakdown will have a volume reduction of 30% by week 4 Date Initiated: 11/27/2015 Goal Status: Active Ulcer/skin breakdown will have a volume reduction of 80% by week 12 Date Initiated: 11/27/2015 Goal Status: Active Ulcer/skin breakdown will heal within 14 weeks Date Initiated: 11/27/2015 Goal Status: Active Interventions: Assess patient/caregiver ability to obtain necessary supplies [name], [name] ([id number]) Assess patient/caregiver ability to perform ulcer/skin care regimen upon admission and as needed Provide education on ulcer and skin care Treatment Activities: Skin care regimen initiated : 11/27/2015 Topical wound management initiated :  11/27/2015 Notes: Electronic Signature(s) Signed: 12/29/2015 4:03:46 PM By: Alric Quan Entered By: Alric Quan on 12/25/2015 13:51:43 [name] ([id number]) -------------------------------------------------------------------------------- Pain Assessment Details Patient Name: [name] Date of Service: 12/25/2015 1:30 PM Medical Record Number: [id number] Patient Account Number: [id number] Date of Birth/Sex: 05/25/1985 (30 y.o. Female) Treating RN: Carolyne Fiscal, Debi Primary Care Physician: [name] Other Clinician: Referring Physician: [name] Treating Physician/Extender: Benjaman Pott in Treatment: 4 Active Problems Location of Pain Severity and Description of Pain Patient Has Paino No Site Locations With Dressing Change: No Pain Management and Medication Current Pain Management: Electronic Signature(s) Signed: 12/29/2015 4:03:46 PM By: Alric Quan Entered By: Alric Quan on 12/25/2015 13:41:29 [name] ([id number]) -------------------------------------------------------------------------------- Patient/Caregiver Education Details Patient Name: [name] Date of Service: 12/25/2015 1:30 PM Medical Record Number: [id number] Patient Account Number: [id number] Date of Birth/Gender: 10-12-1985 (30 y.o. Female) Treating RN: Carolyne Fiscal, Debi Primary Care Physician: [name] Other Clinician: Referring Physician: [name] Treating Physician/Extender: Benjaman Pott in Treatment: 4 Education Assessment Education Provided To: Patient Education Topics Provided Wound/Skin Impairment: Handouts: Other: change dressing as ordered Methods: Demonstration, Explain/Verbal Responses: State content correctly Electronic Signature(s)  Signed: 12/25/2015 3:41:47 PM By: Judene Companion MD Entered By: Judene Companion on 12/25/2015 14:05:54 [name]  ([id number]) -------------------------------------------------------------------------------- Wound Assessment Details Patient Name: [name] Date of Service: 12/25/2015 1:30 PM Medical Record Number: [id number] Patient Account Number: [id number] Date of Birth/Sex: 28-Mar-1985 (30 y.o. Female) Treating RN: Carolyne Fiscal, Debi Primary Care Physician: [name] Other Clinician: Referring Physician: [name] Treating Physician/Extender: Benjaman Pott in Treatment: 4 Wound Status Wound Number: 1 Primary Diabetic Wound/Ulcer of the Lower Etiology: Extremity Wound Location: Left Lower Leg - Medial Wound Open Wounding Event: Bite Status: Date Acquired: 10/17/2015 Comorbid Anemia, Hypertension, Type II Weeks Of Treatment: 4 History: Diabetes, Osteoarthritis, Seizure Clustered Wound: No Disorder Photos Photo Uploaded By: Alric Quan on 12/25/2015 16:43:28 Wound Measurements Length: (cm) 0.3 Width: (cm) 0.4 Depth: (cm) 0.1 Area: (cm) 0.094 Volume: (cm) 0.009 % Reduction in Area: 90.5% % Reduction in Volume: 90.9% Epithelialization: None Tunneling: No Undermining: No Wound Description Classification: Grade 1 Foul Odor Af Wound Margin: Distinct, outline attached Exudate Amount: Large Exudate Type: Serous Exudate Color: amber ter Cleansing: No Wound Bed Granulation Amount: Medium (34-66%) Exposed Structure Granulation Quality: Pink Fascia Exposed: No Necrotic Amount: Medium (34-66%) Fat Layer Exposed: No Necrotic Quality: Adherent Slough Tendon Exposed: No [name], [name] ([id number]) Muscle Exposed: No Joint Exposed: No Bone Exposed: No Limited to Skin Breakdown Periwound Skin Texture Texture Color No Abnormalities Noted: No No Abnormalities Noted: No Induration: Yes Hemosiderin Staining: Yes Localized Edema: Yes Temperature / Pain Moisture Temperature: No Abnormality No Abnormalities Noted: No Tenderness on Palpation: Yes Moist:  Yes Wound Preparation Ulcer Cleansing: Rinsed/Irrigated with Saline Topical Anesthetic Applied: Other: lidocaine 4%, Treatment Notes Wound #1 (Left, Medial Lower Leg) 1. Cleansed with: Clean wound with Normal Saline 3. Peri-wound Care: Skin Prep 4. Dressing Applied: Aquacel Ag 5. Secondary Dressing Applied Dry Gauze Notes coverlet adhesive dressing Electronic Signature(s) Signed: 12/29/2015 4:03:46 PM By: Alric Quan Entered By: Alric Quan on 12/25/2015 13:47:58 [name] ([id number]) -------------------------------------------------------------------------------- Vitals Details Patient Name: [name] Date of Service: 12/25/2015 1:30 PM Medical Record Number: [id number] Patient Account Number: [id number] Date of Birth/Sex: 29-Dec-1985 (30 y.o. Female) Treating RN: Carolyne Fiscal, Debi Primary Care Physician: [name] Other Clinician: Referring Physician: [name] Treating Physician/Extender: Benjaman Pott in Treatment: 4 Vital Signs Time Taken: 13:42 Temperature (F): 98.2 Height (in): 66 Respiratory Rate (breaths/min): 18 Weight (lbs): 180 Reference Range: 80 - 120 mg / dl Body Mass Index (BMI): 29 Electronic Signature(s) Signed: 12/29/2015 4:03:46 PM By: Alric Quan Entered By: Alric Quan on 12/25/2015 13:47:32

## 2015-12-29 NOTE — Telephone Encounter (Signed)
Patient stated her Insurance will not cover the one touch delica, she asked if she could get the accu chek soft clix.pen Please advise   [name]  [address] [place], [place], [place] [address]  Phone: [phone]

## 2016-01-01 ENCOUNTER — Other Ambulatory Visit: Payer: Self-pay

## 2016-01-01 ENCOUNTER — Other Ambulatory Visit: Payer: Self-pay | Admitting: Internal Medicine

## 2016-01-01 MED ORDER — ONETOUCH SURESOFT LANCING DEV MISC: 2 refills | Status: DC

## 2016-01-08 ENCOUNTER — Ambulatory Visit: Payer: Medicare Other | Admitting: Surgery

## 2016-01-08 ENCOUNTER — Encounter (HOSPITAL_COMMUNITY)
Admission: RE | Admit: 2016-01-08 | Discharge: 2016-01-08 | Disposition: A | Discharge status: Home / Self Care | Payer: Medicare Other | Source: Ambulatory Visit | Attending: Nephrology | Admitting: Nephrology

## 2016-01-08 ENCOUNTER — Encounter (HOSPITAL_COMMUNITY): Payer: Self-pay

## 2016-01-08 DIAGNOSIS — D631 Anemia in chronic kidney disease: Secondary | ICD-10-CM | POA: Diagnosis not present

## 2016-01-08 DIAGNOSIS — N183 Chronic kidney disease, stage 3 (moderate): Secondary | ICD-10-CM | POA: Diagnosis not present

## 2016-01-08 DIAGNOSIS — E1121 Type 2 diabetes mellitus with diabetic nephropathy: Secondary | ICD-10-CM | POA: Diagnosis not present

## 2016-01-08 DIAGNOSIS — N182 Chronic kidney disease, stage 2 (mild): Secondary | ICD-10-CM

## 2016-01-08 DIAGNOSIS — I10 Essential (primary) hypertension: Secondary | ICD-10-CM | POA: Diagnosis not present

## 2016-01-08 DIAGNOSIS — S91329A Laceration with foreign body, unspecified foot, initial encounter: Secondary | ICD-10-CM | POA: Diagnosis not present

## 2016-01-08 LAB — IRON AND TIBC
Iron: 52 ug/dL (ref 28–170)
SATURATION RATIOS: 23 % (ref 10.4–31.8)
TIBC: 224 ug/dL — AB (ref 250–450)
UIBC: 172 ug/dL

## 2016-01-08 LAB — FERRITIN: Ferritin: 1081 ng/mL — ABNORMAL HIGH (ref 11–307)

## 2016-01-08 LAB — POCT HEMOGLOBIN-HEMACUE: Hemoglobin: 10.9 g/dL — ABNORMAL LOW (ref 12.0–15.0)

## 2016-01-08 MED ORDER — EPOETIN ALFA 10000 UNIT/ML IJ SOLN
INTRAMUSCULAR | Status: AC
  Administered 2016-01-08: 10000 [IU]
  Filled 2016-01-08: qty 1

## 2016-01-08 MED ORDER — EPOETIN ALFA 40000 UNIT/ML IJ SOLN: 30000.0000 [IU] | INTRAMUSCULAR | Status: DC

## 2016-01-08 MED ORDER — EPOETIN ALFA 20000 UNIT/ML IJ SOLN
INTRAMUSCULAR | Status: AC
  Administered 2016-01-08: 20000 [IU]
  Filled 2016-01-08: qty 1

## 2016-01-09 ENCOUNTER — Encounter: Payer: Medicare Other | Admitting: Surgery

## 2016-01-09 DIAGNOSIS — L97822 Non-pressure chronic ulcer of other part of left lower leg with fat layer exposed: Secondary | ICD-10-CM | POA: Diagnosis not present

## 2016-01-09 DIAGNOSIS — I83023 Varicose veins of left lower extremity with ulcer of ankle: Secondary | ICD-10-CM | POA: Diagnosis not present

## 2016-01-09 DIAGNOSIS — Z940 Kidney transplant status: Secondary | ICD-10-CM | POA: Diagnosis not present

## 2016-01-09 DIAGNOSIS — I890 Lymphedema, not elsewhere classified: Secondary | ICD-10-CM | POA: Diagnosis not present

## 2016-01-09 DIAGNOSIS — L97222 Non-pressure chronic ulcer of left calf with fat layer exposed: Secondary | ICD-10-CM | POA: Diagnosis not present

## 2016-01-09 DIAGNOSIS — E11622 Type 2 diabetes mellitus with other skin ulcer: Secondary | ICD-10-CM | POA: Diagnosis not present

## 2016-01-09 DIAGNOSIS — I739 Peripheral vascular disease, unspecified: Secondary | ICD-10-CM | POA: Diagnosis not present

## 2016-01-10 NOTE — Progress Notes (Signed)
KAMORA, VOSSLER ([id number]) Visit Report for 01/09/2016 Chief Complaint Document Details Patient Name: [name], [name] 01/09/2016 12:45 Date of Service: PM Medical Record [id number] Number: Patient Account Number: [id number] 1985/05/29 (30 y.o. Treating RN: [name] Date of Birth/Sex: Female) Other Clinician: Primary Care Physician: [name] Treating [name] Referring Physician: Nolene [name] Physician/Extender: [name] in Treatment: 6 Information Obtained from: Patient Chief Complaint Patient presents to the wound care center for a consult due non healing wound left lower extremity which has been there for about a month Electronic Signature(s) Signed: 01/09/2016 1:16:49 PM By: Valerie Fudge MD, FACS Entered By: [name] on 01/09/2016 13:16:49 [name] ([id number]) -------------------------------------------------------------------------------- HPI Details Patient Name: [name], [name] 01/09/2016 12:45 Date of Service: PM Medical Record [id number] Number: Patient Account Number: [id number] 09/22/1985 (30 y.o. Treating RN: [name] Date of Birth/Sex: Female) Other Clinician: Primary Care Physician: [name] Treating [name] Referring Physician: Nolene [name] Physician/Extender: [name] in Treatment: 6 History of Present Illness Location: left lower extremity medially Quality: Patient reports experiencing a sharp pain to affected area(s). Severity: Patient states wound are getting worse. Duration: Patient has had the wound for < 4 [name] prior to presenting for treatment Timing: Pain in wound is Intermittent (comes and goes Context: The wound occurred when the patient she was bitten by a spider Modifying Factors: Other treatment(s) tried include:antibioticsand local treatment Associated Signs and Symptoms: Patient reports having increase swelling. HPI Description: 30 year old patient to comes to see [place] for a wound on her left  lower extremity had for about a month and she believes a spider bit her while she was driving. Past medical history of kidney transplant status, hypertension, anemia, posttransplant diabetes mellitus and hyperlipidemia. his medical history also includes post transplant diabetes mellitus, hypertension, learning disability, previous parathyroidectomy for secondary hyperparathyroidism and she has had a regular follow-up with her kidney doctors. He has never been a smoker. 12/11/2015 -- venous duplex reflux evaluation done on 12/09/2015 showed no evidence of left lower extremity deep vein thrombosis. There is reflux in the left common femoral vein and femoral vein. There is reflux in the left saphenofemoral junction and the proximal great saphenous vein and in the greater saphenous vein mid calf area. No reflux in the left small saphenous vein. The report was read by Dr. Sherren Mocha Early who said no further vascular workup indicated at this time. the arterial duplex evaluation revealed the was no hemodynamically significant stenosis of the left lower extremity arteries without plaque visualized in the left ABI was 1.07 on the right ABI was 1.12 Electronic Signature(s) Signed: 01/09/2016 1:16:54 PM By: Valerie Fudge MD, FACS Entered By: [name] on 01/09/2016 13:16:54 [name] ([id number]) -------------------------------------------------------------------------------- Physical Exam Details Patient Name: [name], [name] 01/09/2016 12:45 Date of Service: PM Medical Record [id number] Number: Patient Account Number: [id number] 1985/12/19 (30 y.o. Treating RN: [name] Date of Birth/Sex: Female) Other Clinician: Primary Care Physician: [name] Treating [name] Referring Physician: Nolene [name] Physician/Extender: [name] in Treatment: 6 Constitutional . Pulse regular. Respirations normal and unlabored. Afebrile. . Eyes Nonicteric. Reactive to light. Ears, Nose,  Mouth, and Throat Lips, teeth, and gums WNL.Marland Kitchen Moist mucosa without lesions. Neck supple and nontender. No palpable supraclavicular or cervical adenopathy. Normal sized without goiter. Respiratory WNL. No retractions.. Breath sounds WNL, No rubs, rales, rhonchi, or wheeze.. Cardiovascular Heart rhythm and rate regular, no murmur or gallop.. Pedal Pulses WNL. No clubbing, cyanosis or edema. Lymphatic No adneopathy. No adenopathy. No adenopathy. Musculoskeletal Adexa without  tenderness or enlargement.. Digits and nails w/o clubbing, cyanosis, infection, petechiae, ischemia, or inflammatory conditions.. Integumentary (Hair, Skin) No suspicious lesions. No crepitus or fluctuance. No peri-wound warmth or erythema. No masses.Marland Kitchen Psychiatric Judgement and insight Intact.. No evidence of depression, anxiety, or agitation.. Notes a ulcerated area is healed except for a tiny pin head size but this is draining clear lymph Electronic Signature(s) Signed: 01/09/2016 1:17:35 PM By: Valerie Fudge MD, FACS Entered By: [name] on 01/09/2016 13:17:35 [name] ([id number]) -------------------------------------------------------------------------------- Physician Orders Details Patient Name: [name], [name] 01/09/2016 12:45 Date of Service: PM Medical Record [id number] Number: Patient Account Number: [id number] May 26, 1985 (30 y.o. Treating RN: [name] Date of Birth/Sex: Female) Other Clinician: Primary Care Physician: [name] Treating Azaela Caracci Referring Physician: Nolene [name] Physician/Extender: [name] in Treatment: 6 Verbal / Phone Orders: Yes ClinicianCarolyne Fiscal, Debi Read Back and Verified: Yes Diagnosis Coding Wound Cleansing Wound #1 Left,Medial Lower Leg o Cleanse wound with mild soap and water o May Shower, gently pat wound dry prior to applying new dressing. o May shower with protection. Primary Wound Dressing Wound #1 Left,Medial Lower  Leg o Aquacel Ag Secondary Dressing Wound #1 Left,Medial Lower Leg o Dry Gauze o Non-adherent pad - coverlet adhesive dressing o Drawtex Dressing Change Frequency Wound #1 Left,Medial Lower Leg o Change dressing every day. - and as needed Follow-up Appointments Wound #1 Left,Medial Lower Leg o Return Appointment in 1 week. Edema Control Wound #1 Left,Medial Lower Leg o Patient to wear own compression stockings o Elevate legs to the level of the heart and pump ankles as often as possible o Support Garment 20-30 mm/Hg pressure to: Additional Orders / Instructions Wound #1 Left,Medial Lower Leg o Increase protein intake. JEFFERY, GAMMELL ([id number]) o OK to return to work with the following restrictions: o Activity as tolerated Engineer, maintenance) Signed: 01/09/2016 4:11:36 PM By: Valerie Fudge MD, FACS Signed: 01/09/2016 5:06:12 PM By: Alric Quan Entered By: Alric Quan on 01/09/2016 13:07:08 [name] ([id number]) -------------------------------------------------------------------------------- Problem List Details Patient Name: [name], [name] 01/09/2016 12:45 Date of Service: PM Medical Record [id number] Number: Patient Account Number: [id number] 1985/07/19 (30 y.o. 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 Treating RN: [name] Date of Birth/Sex: Female) Other Clinician: Primary Care Physician: [name] Treating Yostin Malacara Referring Physician: Nolene [name] Physician/Extender: [name] in Treatment: 6 Subjective Chief Complaint Information obtained from Patient Patient presents to the wound care center for a consult due non healing wound left lower extremity which has been there for about a month History of Present Illness (HPI) The following HPI elements were documented for the patient's wound: Location: left lower extremity medially Quality: Patient reports experiencing a sharp  pain to affected area(s). Severity: Patient states wound are getting worse. Duration: Patient has had the wound for < 4 [name] prior to presenting for treatment Timing: Pain in wound is Intermittent (comes and goes Context: The wound occurred when the patient she was bitten by a spider Modifying Factors: Other treatment(s) tried include:antibioticsand local treatment Associated Signs and Symptoms: Patient reports having increase swelling. 30 year old patient to comes to see [place] for a wound on her left lower extremity had for about a month and she believes a spider bit her while she was driving. Past medical history of kidney transplant status, hypertension, anemia, posttransplant diabetes mellitus and hyperlipidemia. his medical history also includes post transplant diabetes mellitus, hypertension, learning disability,  previous parathyroidectomy for secondary hyperparathyroidism and she has had a regular follow-up with her kidney doctors. He has never been a smoker. 12/11/2015 -- venous duplex reflux evaluation done on 12/09/2015 showed no evidence of left lower extremity deep vein thrombosis. There is reflux in the left common femoral vein and femoral vein. There is reflux in the left saphenofemoral junction and the proximal great saphenous vein and in the greater saphenous vein mid calf area. No reflux in the left small saphenous vein. The report was read by Dr. Sherren Mocha Early who said no further vascular workup indicated at this time. the arterial duplex evaluation revealed the was no hemodynamically significant stenosis of the left lower extremity arteries without plaque visualized in the left ABI was 1.07 on the right ABI was 1.12 [name], [name]. ([id number]) Objective Constitutional Pulse regular. Respirations normal and unlabored. Afebrile. Vitals Time Taken: 12:54 PM, Height: 66 in, Weight: 180 lbs, BMI: 29, Temperature: 98.4 F, Pulse: 97 bpm, Respiratory Rate: 20 breaths/min, Blood Pressure: 142/96 mmHg. Eyes Nonicteric. Reactive to light. Ears, Nose, Mouth, and Throat Lips, teeth, and gums WNL.Marland Kitchen Moist mucosa without lesions. Neck supple and nontender. No palpable supraclavicular or cervical adenopathy. Normal sized without goiter. Respiratory WNL. No retractions.. Breath sounds WNL, No rubs, rales, rhonchi, or wheeze.. Cardiovascular Heart rhythm and rate regular, no murmur or gallop.. Pedal Pulses WNL. No clubbing, cyanosis or edema. Lymphatic No adneopathy. No adenopathy. No adenopathy. Musculoskeletal Adexa without tenderness or enlargement.. Digits and nails w/o clubbing, cyanosis, infection, petechiae, ischemia, or inflammatory conditions.Marland Kitchen Psychiatric Judgement and insight Intact.. No evidence of depression, anxiety, or agitation.. General Notes: a ulcerated area is healed except for a  tiny pin head size but this is draining clear lymph Integumentary (Hair, Skin) No suspicious lesions. No crepitus or fluctuance. No peri-wound warmth or erythema. No masses.. Wound #1 status is Open. Original cause of wound was Bite. The wound is located on the Left,Medial Lower Leg. The wound measures 0.2cm length x 0.2cm width x 0.1cm depth; 0.031cm^2 area and 0.003cm^3 volume. The wound is limited to skin breakdown. There is no tunneling or undermining noted. There is a large amount of serous drainage noted. The wound margin is distinct with the outline attached to the wound base. There is medium (34-66%) pink granulation within the wound bed. There is a medium (34- 66%) amount of necrotic tissue within the wound bed including Adherent Slough. The periwound skin appearance exhibited: Induration, Localized Edema, Moist, Hemosiderin Staining. Periwound temperature was noted as No Abnormality. The periwound has tenderness on palpation. 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 May shower with protection. Primary Wound Dressing: Wound #1 Left,Medial Lower Leg: Aquacel Ag Secondary Dressing: Wound #1 Left,Medial Lower Leg: Dry Gauze Non-adherent pad - coverlet adhesive dressing Drawtex Dressing Change Frequency: Wound #1 Left,Medial Lower Leg: Change dressing every day. - and as needed Follow-up Appointments: Wound #1 Left,Medial Lower Leg: Return Appointment in 1  week. Edema Control: Wound #1 Left,Medial Lower Leg: Patient to wear own compression stockings Elevate legs to the level of the heart and pump ankles as often as possible Support Garment 20-30 mm/Hg pressure to: Additional Orders / Instructions: Wound #1 Left,Medial Lower Leg: Increase protein intake. OK to return to work with the following restrictions: Activity as tolerated [name], [name]. ([id number]) I have recommended: 1. silver alginate with drawtex and compression stockings of the 20-30 mm variety. 2. elevation and exercise 3. good control of her diabetes mellitus -- last hemoglobin A1c was 11% 4. regular visits to the wound center Electronic Signature(s) Signed: 01/09/2016 1:18:59 PM By: Valerie Fudge MD, FACS Entered By: [name] on 01/09/2016 13:18:59 [name] ([id number]) -------------------------------------------------------------------------------- SuperBill Details Patient Name: [name] Date of Service: 01/09/2016 Medical Record Number: [id number] Patient Account Number: [id number] Date of Birth/Sex: 26-Jan-1986 (30 y.o. 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 Facility Procedures CPT4 Code: [id number] Description: 99213 - WOUND CARE VISIT-LEV 3 EST PT Modifier: Quantity: 1 Physician Procedures CPT4 Code: 5449201 Description: 00712 - WC PHYS LEVEL 3 - EST PT ICD-10 Description Diagnosis E13.622 Other specified diabetes mellitus  with other skin I73.9 Peripheral vascular disease, unspecified I89.0 Lymphedema, not elsewhere classified I83.023 Varicose veins of left  lower extremity with ulcer Modifier: ulcer of ankle Quantity: 1 Electronic Signature(s) Signed: 01/09/2016 4:11:36 PM By: Valerie Fudge MD, FACS Signed: 01/09/2016 5:06:12 PM By: Alric Quan Previous Signature: 01/09/2016 1:28:57 PM Version By: Valerie Fudge MD, FACS Entered By: Alric Quan on 01/09/2016 13:36:49

## 2016-01-10 NOTE — Progress Notes (Signed)
Valerie, [name] ([id number]) Visit Report for 01/09/2016 Arrival Information Details Patient Name: [name], [name] Date of Service: 01/09/2016 12:45 PM Medical Record Number: [id number] Patient Account Number: [id number] Date of Birth/Sex: [birth year] (30 y.o. Female) Treating RN: Carolyne Fiscal, Debi Primary Care Physician: [name] Other Clinician: Referring Physician: [name] Treating Physician/Extender: Frann Rider in Treatment: 6 Visit Information History Since Last Visit All ordered tests and consults were completed: No Patient Arrived: Ambulatory Added or deleted any medications: No Arrival Time: 12:53 Any new allergies or adverse reactions: No Accompanied By: self Had a fall or experienced change in No Transfer Assistance: None activities of daily living that may affect Patient Identification Verified: Yes risk of falls: Secondary Verification Process Yes Signs or symptoms of abuse/neglect since last No Completed: visito Patient Requires Transmission- No Hospitalized since last visit: No Based Precautions: Pain Present Now: No Patient Has Alerts: Yes Patient Alerts: DM II PT REFUSES FACE PHOTO Electronic Signature(s) Signed: 01/09/2016 5:06:12 PM By: Alric Quan Entered By: Alric Quan on 01/09/2016 12:54:07 [name] ([id number]) -------------------------------------------------------------------------------- Clinic Level of Care Assessment Details Patient Name: [name] Date of Service: 01/09/2016 12:45 PM Medical Record Number: [id number] Patient Account Number: [id number] Date of Birth/Sex: 1985-07-03 (30 y.o. Female) Treating RN: Carolyne Fiscal, Debi Primary Care Physician: [name] Other Clinician: Referring Physician: [name] Treating Physician/Extender: Frann Rider in Treatment: 6 Clinic Level of Care Assessment Items TOOL 4 Quantity Score X - Use when only an EandM is performed on FOLLOW-UP visit 1  0 ASSESSMENTS - Nursing Assessment / Reassessment X - Reassessment of Co-morbidities (includes updates in patient status) 1 10 X - Reassessment of Adherence to Treatment Plan 1 5 ASSESSMENTS - Wound and Skin Assessment / Reassessment X - Simple Wound Assessment / Reassessment - one wound 1 5 []  - Complex Wound Assessment / Reassessment - multiple wounds 0 []  - Dermatologic / Skin Assessment (not related to wound area) 0 ASSESSMENTS - Focused Assessment []  - Circumferential Edema Measurements - multi extremities 0 []  - Nutritional Assessment / Counseling / Intervention 0 []  - Lower Extremity Assessment (monofilament, tuning fork, pulses) 0 []  - Peripheral Arterial Disease Assessment (using hand held doppler) 0 ASSESSMENTS - Ostomy and/or Continence Assessment and Care []  - Incontinence Assessment and Management 0 []  - Ostomy Care Assessment and Management (repouching, etc.) 0 PROCESS - Coordination of Care X - Simple Patient / Family Education for ongoing care 1 15 []  - Complex (extensive) Patient / Family Education for ongoing care 0 X - Staff obtains Programmer, systems, Records, Test Results / Process Orders 1 10 []  - Staff telephones HHA, Nursing Homes / Clarify orders / etc 0 []  - Routine Transfer to another Facility (non-emergent condition) 0 CARYN, GIENGER ([id number]) []  - Routine Hospital Admission (non-emergent condition) 0 []  - New Admissions / Biomedical engineer / Ordering NPWT, Apligraf, etc. 0 []  - Emergency Hospital Admission (emergent condition) 0 X - Simple Discharge Coordination 1 10 []  - Complex (extensive) Discharge Coordination 0 PROCESS - Special Needs []  - Pediatric / Minor Patient Management 0 []  - Isolation Patient Management 0 []  - Hearing / Language / Visual special needs 0 []  - Assessment of Community assistance (transportation, D/C planning, etc.) 0 []  - Additional assistance / Altered mentation 0 []  - Support Surface(s) Assessment (bed, cushion, seat, etc.)  0 INTERVENTIONS - Wound Cleansing / Measurement X - Simple Wound Cleansing - one wound 1 5 []  - Complex Wound Cleansing - multiple wounds 0 X - Wound  Imaging (photographs - any number of wounds) 1 5 []  - Wound Tracing (instead of photographs) 0 X - Simple Wound Measurement - one wound 1 5 []  - Complex Wound Measurement - multiple wounds 0 INTERVENTIONS - Wound Dressings X - Small Wound Dressing one or multiple wounds 1 10 []  - Medium Wound Dressing one or multiple wounds 0 []  - Large Wound Dressing one or multiple wounds 0 X - Application of Medications - topical 1 5 []  - Application of Medications - injection 0 INTERVENTIONS - Miscellaneous []  - External ear exam 0 CHAMYA, HUNTON ([id number]) []  - Specimen Collection (cultures, biopsies, blood, body fluids, etc.) 0 []  - Specimen(s) / Culture(s) sent or taken to Lab for analysis 0 []  - Patient Transfer (multiple staff / Harrel Lemon Lift / Similar devices) 0 []  - Simple Staple / Suture removal (25 or less) 0 []  - Complex Staple / Suture removal (26 or more) 0 []  - Hypo / Hyperglycemic Management (close monitor of Blood Glucose) 0 []  - Ankle / Brachial Index (ABI) - do not check if billed separately 0 X - Vital Signs 1 5 Has the patient been seen at the hospital within the last three years: Yes Total Score: 90 Level Of Care: New/Established - Level 3 Electronic Signature(s) Signed: 01/09/2016 5:06:12 PM By: Alric Quan Entered By: Alric Quan on 01/09/2016 13:36:17 [name] ([id number]) -------------------------------------------------------------------------------- Encounter Discharge Information Details Patient Name: [name] Date of Service: 01/09/2016 12:45 PM Medical Record Number: [id number] Patient Account Number: [id number] Date of Birth/Sex: 1985-09-07 (30 y.o. Female) Treating RN: Carolyne Fiscal, Debi Primary Care Physician: [name] Other Clinician: Referring Physician: [name] Treating  Physician/Extender: Frann Rider in Treatment: 6 Encounter Discharge Information Items Discharge Pain Level: 0 Discharge Condition: Stable Ambulatory Status: Ambulatory Discharge Destination: Home Transportation: Private Auto Accompanied By: self Schedule Follow-up Appointment: Yes Medication Reconciliation completed and provided to Patient/Care Yes Myrth Dahan: Provided on Clinical Summary of Care: 01/09/2016 Form Type Recipient Paper Patient DC Electronic Signature(s) Signed: 01/09/2016 1:16:13 PM By: Ruthine Dose Entered By: Ruthine Dose on 01/09/2016 13:16:12 [name] ([id number]) -------------------------------------------------------------------------------- Lower Extremity Assessment Details Patient Name: [name] Date of Service: 01/09/2016 12:45 PM Medical Record Number: [id number] Patient Account Number: [id number] Date of Birth/Sex: 07-29-1985 (30 y.o. Female) Treating RN: Carolyne Fiscal, Debi Primary Care Physician: [name] Other Clinician: Referring Physician: [name] Treating Physician/Extender: Frann Rider in Treatment: 6 Vascular Assessment Pulses: Posterior Tibial Dorsalis Pedis Palpable: [Left:Yes] Extremity colors, hair growth, and conditions: Extremity Color: [Left:Normal] Temperature of Extremity: [Left:Warm] Capillary Refill: [Left:< 3 seconds] Toe Nail Assessment Left: Right: Thick: No Discolored: No Deformed: No Improper Length and Hygiene: No Electronic Signature(s) Signed: 01/09/2016 5:06:12 PM By: Alric Quan Entered By: Alric Quan on 01/09/2016 12:58:35 [name] ([id number]) -------------------------------------------------------------------------------- Multi Wound Chart Details Patient Name: [name] Date of Service: 01/09/2016 12:45 PM Medical Record Number: [id number] Patient Account Number: [id number] Date of Birth/Sex: 1985/12/10 (30 y.o. Female) Treating RN: Carolyne Fiscal,  Debi Primary Care Physician: [name] Other Clinician: Referring Physician: [name] Treating Physician/Extender: Frann Rider in Treatment: 6 Vital Signs Height(in): 66 Pulse(bpm): 97 Weight(lbs): 180 Blood Pressure 142/96 (mmHg): Body Mass Index(BMI): 29 Temperature(F): 98.4 Respiratory Rate 20 (breaths/min): Photos: [1:No Photos] [N/A:N/A] Wound Location: [1:Left Lower Leg - Medial] [N/A:N/A] Wounding Event: [1:Bite] [N/A:N/A] Primary Etiology: [1:Diabetic Wound/Ulcer of the Lower Extremity] [N/A:N/A] Comorbid History: [1:Anemia, Hypertension, Type II Diabetes, Osteoarthritis, Seizure Disorder] [N/A:N/A] Date Acquired: [1:10/17/2015] [N/A:N/A] Weeks of Treatment: [1:6] [N/A:N/A]  Wound Status: [1:Open] [N/A:N/A] Measurements L x W x D 0.2x0.2x0.1 [N/A:N/A] (cm) Area (cm) : [1:0.031] [N/A:N/A] Volume (cm) : [1:0.003] [N/A:N/A] % Reduction in Area: [1:96.90%] [N/A:N/A] % Reduction in Volume: 97.00% [N/A:N/A] Classification: [1:Grade 1] [N/A:N/A] Exudate Amount: [1:Large] [N/A:N/A] Exudate Type: [1:Serous] [N/A:N/A] Exudate Color: [1:amber] [N/A:N/A] Wound Margin: [1:Distinct, outline attached] [N/A:N/A] Granulation Amount: [1:Medium (34-66%)] [N/A:N/A] Granulation Quality: [1:Pink] [N/A:N/A] Necrotic Amount: [1:Medium (34-66%)] [N/A:N/A] Exposed Structures: [1:Fascia: No Fat: No Tendon: No Muscle: No] [N/A:N/A] Joint: No Bone: No Limited to Skin Breakdown Epithelialization: None N/A N/A Periwound Skin Texture: Edema: Yes N/A N/A Induration: Yes Periwound Skin Moist: Yes N/A N/A Moisture: Periwound Skin Color: Hemosiderin Staining: Yes N/A N/A Temperature: No Abnormality N/A N/A Tenderness on Yes N/A N/A Palpation: Wound Preparation: Ulcer Cleansing: N/A N/A Rinsed/Irrigated with Saline Topical Anesthetic Applied: Other: lidocaine 4% Treatment Notes Electronic Signature(s) Signed: 01/09/2016 5:06:12 PM By: Alric Quan Entered  By: Alric Quan on 01/09/2016 12:59:48 [name] ([id number]) -------------------------------------------------------------------------------- [place] Details Patient Name: [name] Date of Service: 01/09/2016 12:45 PM Medical Record Number: [id number] Patient Account Number: [id number] Date of Birth/Sex: 03-14-1986 (30 y.o. Female) Treating RN: Carolyne Fiscal, Debi Primary Care Physician: [name] Other Clinician: Referring Physician: [name] Treating Physician/Extender: Frann Rider in Treatment: 6 Active Inactive Orientation to the Wound Care Program Nursing Diagnoses: Knowledge deficit related to the wound healing center program Goals: Patient/caregiver will verbalize understanding of the [place] Program Date Initiated: 11/27/2015 Goal Status: Active Interventions: Provide education on orientation to the wound center Notes: Wound/Skin Impairment Nursing Diagnoses: Impaired tissue integrity Knowledge deficit related to ulceration/compromised skin integrity Goals: Patient/caregiver will verbalize understanding of skin care regimen Date Initiated: 11/27/2015 Goal Status: Active Ulcer/skin breakdown will have a volume reduction of 30% by week 4 Date Initiated: 11/27/2015 Goal Status: Active Ulcer/skin breakdown will have a volume reduction of 80% by week 12 Date Initiated: 11/27/2015 Goal Status: Active Ulcer/skin breakdown will heal within 14 weeks Date Initiated: 11/27/2015 Goal Status: Active Interventions: Assess patient/caregiver ability to obtain necessary supplies DUSTY, WAGONER ([id number]) Assess patient/caregiver ability to perform ulcer/skin care regimen upon admission and as needed Provide education on ulcer and skin care Treatment Activities: Skin care regimen initiated : 11/27/2015 Topical wound management initiated : 11/27/2015 Notes: Electronic Signature(s) Signed: 01/09/2016 5:06:12 PM By:  Alric Quan Entered By: Alric Quan on 01/09/2016 12:59:44 [name] ([id number]) -------------------------------------------------------------------------------- Pain Assessment Details Patient Name: [name] Date of Service: 01/09/2016 12:45 PM Medical Record Number: [id number] Patient Account Number: [id number] Date of Birth/Sex: March 24, 1985 (30 y.o. Female) Treating RN: Carolyne Fiscal, Debi Primary Care Physician: [name] Other Clinician: Referring Physician: [name] Treating Physician/Extender: Frann Rider in Treatment: 6 Active Problems Location of Pain Severity and Description of Pain Patient Has Paino No Site Locations With Dressing Change: No Pain Management and Medication Current Pain Management: Electronic Signature(s) Signed: 01/09/2016 5:06:12 PM By: Alric Quan Entered By: Alric Quan on 01/09/2016 12:54:13 [name] ([id number]) -------------------------------------------------------------------------------- Wound Assessment Details Patient Name: [name] Date of Service: 01/09/2016 12:45 PM Medical Record Number: [id number] Patient Account Number: [id number] Date of Birth/Sex: 11/10/1985 (30 y.o. Female) Treating RN: Carolyne Fiscal, Debi Primary Care Physician: [name] Other Clinician: Referring Physician: [name] Treating Physician/Extender: Frann Rider in Treatment: 6 Wound Status Wound Number: 1 Primary Diabetic Wound/Ulcer of the Lower Etiology: Extremity Wound Location: Left Lower Leg - Medial Wound Open Wounding Event: Bite Status: Date Acquired: 10/17/2015 Comorbid Anemia, Hypertension, Type II  Weeks Of Treatment: 6 History: Diabetes, Osteoarthritis, Seizure Clustered Wound: No Disorder Photos Photo Uploaded By: Alric Quan on 01/09/2016 13:30:45 Wound Measurements Length: (cm) 0.2 Width: (cm) 0.2 Depth: (cm) 0.1 Area: (cm) 0.031 Volume: (cm) 0.003 %  Reduction in Area: 96.9% % Reduction in Volume: 97% Epithelialization: None Tunneling: No Undermining: No Wound Description Classification: Grade 1 Foul Odor Af Wound Margin: Distinct, outline attached Exudate Amount: Large Exudate Type: Serous Exudate Color: amber ter Cleansing: No Wound Bed Granulation Amount: Medium (34-66%) Exposed Structure Granulation Quality: Pink Fascia Exposed: No Necrotic Amount: Medium (34-66%) Fat Layer Exposed: No Necrotic Quality: Adherent Slough Tendon Exposed: No VERBLE, STYRON ([id number]) Muscle Exposed: No Joint Exposed: No Bone Exposed: No Limited to Skin Breakdown Periwound Skin Texture Texture Color No Abnormalities Noted: No No Abnormalities Noted: No Induration: Yes Hemosiderin Staining: Yes Localized Edema: Yes Temperature / Pain Moisture Temperature: No Abnormality No Abnormalities Noted: No Tenderness on Palpation: Yes Moist: Yes Wound Preparation Ulcer Cleansing: Rinsed/Irrigated with Saline Topical Anesthetic Applied: Other: lidocaine 4%, Treatment Notes Wound #1 (Left, Medial Lower Leg) 1. Cleansed with: Clean wound with Normal Saline 2. Anesthetic Topical Lidocaine 4% cream to wound bed prior to debridement 3. Peri-wound Care: Skin Prep 5. Secondary Dressing Applied Dry Gauze Notes coverlet adhesive dressing, silver alginate, drawtex Electronic Signature(s) Signed: 01/09/2016 5:06:12 PM By: Alric Quan Entered By: Alric Quan on 01/09/2016 12:58:54 [name] ([id number]) -------------------------------------------------------------------------------- Vitals Details Patient Name: [name] Date of Service: 01/09/2016 12:45 PM Medical Record Number: [id number] Patient Account Number: [id number] Date of Birth/Sex: [birth year] (30 y.o. Female) Treating RN: Carolyne Fiscal, Debi Primary Care Physician: [name] Other Clinician: Referring Physician: [name] Treating Physician/Extender:  Frann Rider in Treatment: 6 Vital Signs Time Taken: 12:54 Temperature (F): 98.4 Height (in): 66 Pulse (bpm): 97 Weight (lbs): 180 Respiratory Rate (breaths/min): 20 Body Mass Index (BMI): 29 Blood Pressure (mmHg): 142/96 Reference Range: 80 - 120 mg / dl Electronic Signature(s) Signed: 01/09/2016 5:06:12 PM By: Alric Quan Entered By: Alric Quan on 01/09/2016 12:55:58

## 2016-01-15 DIAGNOSIS — E139 Other specified diabetes mellitus without complications: Secondary | ICD-10-CM | POA: Diagnosis not present

## 2016-01-15 DIAGNOSIS — Z6829 Body mass index (BMI) 29.0-29.9, adult: Secondary | ICD-10-CM | POA: Diagnosis not present

## 2016-01-15 DIAGNOSIS — Z940 Kidney transplant status: Secondary | ICD-10-CM | POA: Diagnosis not present

## 2016-01-15 DIAGNOSIS — I10 Essential (primary) hypertension: Secondary | ICD-10-CM | POA: Diagnosis not present

## 2016-01-15 DIAGNOSIS — E785 Hyperlipidemia, unspecified: Secondary | ICD-10-CM | POA: Diagnosis not present

## 2016-01-15 DIAGNOSIS — D649 Anemia, unspecified: Secondary | ICD-10-CM | POA: Diagnosis not present

## 2016-01-15 DIAGNOSIS — S81802A Unspecified open wound, left lower leg, initial encounter: Secondary | ICD-10-CM | POA: Diagnosis not present

## 2016-01-19 ENCOUNTER — Ambulatory Visit: Payer: Medicare Other | Admitting: Surgery

## 2016-01-22 ENCOUNTER — Encounter: Payer: Self-pay | Admitting: Endocrinology

## 2016-01-22 ENCOUNTER — Ambulatory Visit (INDEPENDENT_AMBULATORY_CARE_PROVIDER_SITE_OTHER): Payer: Medicare Other | Admitting: Endocrinology

## 2016-01-22 ENCOUNTER — Encounter (HOSPITAL_COMMUNITY)
Admission: RE | Admit: 2016-01-22 | Discharge: 2016-01-22 | Disposition: A | Discharge status: Home / Self Care | Payer: Medicare Other | Source: Ambulatory Visit | Attending: Nephrology | Admitting: Nephrology

## 2016-01-22 VITALS — BP 122/80 | HR 92 | Ht 66.0 in | Wt 182.0 lb

## 2016-01-22 DIAGNOSIS — D631 Anemia in chronic kidney disease: Secondary | ICD-10-CM | POA: Diagnosis not present

## 2016-01-22 DIAGNOSIS — N183 Chronic kidney disease, stage 3 (moderate): Secondary | ICD-10-CM | POA: Diagnosis not present

## 2016-01-22 DIAGNOSIS — N182 Chronic kidney disease, stage 2 (mild): Secondary | ICD-10-CM

## 2016-01-22 DIAGNOSIS — N185 Chronic kidney disease, stage 5: Secondary | ICD-10-CM | POA: Diagnosis not present

## 2016-01-22 DIAGNOSIS — E1022 Type 1 diabetes mellitus with diabetic chronic kidney disease: Secondary | ICD-10-CM

## 2016-01-22 LAB — POCT HEMOGLOBIN-HEMACUE: Hemoglobin: 10.4 g/dL — ABNORMAL LOW (ref 12.0–15.0)

## 2016-01-22 MED ORDER — EPOETIN ALFA 40000 UNIT/ML IJ SOLN: 30000.0000 [IU] | INTRAMUSCULAR | Status: DC

## 2016-01-22 MED ORDER — EPOETIN ALFA 20000 UNIT/ML IJ SOLN
INTRAMUSCULAR | Status: AC
  Administered 2016-01-22: 20000 [IU] via SUBCUTANEOUS
  Filled 2016-01-22: qty 1

## 2016-01-22 MED ORDER — EPOETIN ALFA 10000 UNIT/ML IJ SOLN
INTRAMUSCULAR | Status: AC
  Administered 2016-01-22: 10000 [IU] via SUBCUTANEOUS
  Filled 2016-01-22: qty 1

## 2016-01-22 NOTE — Patient Instructions (Addendum)
Please increase the tresiba to 120 units daily.  If you miss a shot, take it as soon as you remember.  You should take this no matter what your blood sugar is.   On this type of insulin schedule, you should eat meals on a regular schedule.  If a meal is missed or significantly delayed, your blood sugar could go low.   Please come back for a follow-up appointment in 3 months.   check your blood sugar 4 times a day: before the 3 meals, and at bedtime.  also check if you have symptoms of your blood sugar being too high or too low.  please keep a record of the readings and bring it to your next appointment here.  You can write it on any piece of paper.  please call us sooner if your blood sugar goes below 70, or if you have a lot of readings over 200.

## 2016-01-22 NOTE — Progress Notes (Signed)
Subjective:    Patient ID: [name], female    DOB: [birth year], 30 y.o.   MRN: [id number]  HPI Pt returns for f/u of diabetes mellitus: DM type: 1 Dx'ed: 2012, when she was on prednisone Complications: polyneuropathy and renal failure (transplant in 2007).   Therapy: insulin since soon after dx.   GDM: (G0).   DKA: several episodes, most recently in mid-2016.  Severe hypoglycemia: never.   Pancreatitis: never.  Other: she took pump rx during 2016, but was changed to qd insulin, due to poor results; she is chronically on prednisone; pt has had TAH. Interval history: she brings a scant record of her cbg's which i have reviewed today.  It varies from 171-413.  She says she never misses the insulin, but takes a widely varying dosage of insulin, according to cbg's.   Past Medical History:  Diagnosis Date  . Chronic kidney disease   . Diabetes mellitus    Pt reports diagnosis in June 2011  . Hyperlipidemia   . Hypertension   . Kidney transplant recipient   . UTI (urinary tract infection) 01/09/2015    Past Surgical History:  Procedure Laterality Date  . KIDNEY TRANSPLANT  2007  . RENAL BIOPSY Bilateral 2003  . THYROID LOBECTOMY      Social History   Social History  . Marital status: Single    Spouse name: N/A  . Number of children: N/A  . Years of education: N/A   Occupational History  . Not on file.   Social History Main Topics  . Smoking status: Never Smoker  . Smokeless tobacco: Never Used  . Alcohol use No  . Drug use: No  . Sexual activity: Yes    Birth control/ protection: None   Other Topics Concern  . Not on file   Social History Narrative  . No narrative on file    Current Outpatient Prescriptions on File Prior to Visit  Medication Sig Dispense Refill  . ACCU-CHEK SOFTCLIX LANCETS lancets Use to check blood sugar 4 times per day. 150 each 5  . ACCU-CHEK SOFTCLIX LANCETS lancets USE TO CHECK BLOOD SUGAR FOUR TIMES DAILY AS DIRECTED 300 each 2    . acetaminophen (TYLENOL) 500 MG tablet Take 1,000-1,500 mg by mouth every 6 (six) hours as needed for mild pain.     Marland Kitchen amoxicillin-clavulanate (AUGMENTIN) 875-125 MG tablet Take 1 tablet by mouth every 12 (twelve) hours. (Patient not taking: Reported on 11/20/2015) 14 tablet 0  . calcium carbonate (TUMS) 500 MG chewable tablet Chew 2 tablets by mouth 3 (three) times daily as needed for indigestion. Takes on Monday, wednesdays, and fridays    . cetirizine (ZYRTEC) 10 MG tablet Take 10 mg by mouth daily.    . diazepam (VALIUM) 5 MG tablet Take 1 tablet (5 mg total) by mouth every 6 (six) hours as needed for anxiety (spasms). 6 tablet 0  . doxycycline (VIBRAMYCIN) 100 MG capsule Take 1 capsule (100 mg total) by mouth 2 (two) times daily. (Patient not taking: Reported on 11/20/2015) 20 capsule 0  . famotidine (PEPCID) 20 MG tablet Take 1 tablet (20 mg total) by mouth daily. (Patient taking differently: Take 20 mg by mouth daily. For heartburn and acid reflux) 30 tablet 0  . fluticasone (FLONASE) 50 MCG/ACT nasal spray Place 2 sprays into both nostrils daily. 9.9 g 2  . glucose blood (BAYER CONTOUR NEXT TEST) test strip Check 4 times per day. DX CODE E11.9 300 each 12  .  glucose blood (ONETOUCH VERIO) test strip Check 4 times per day. DX CODE E11.9 200 each 5  . guaiFENesin (MUCINEX) 600 MG 12 hr tablet Take 2 tablets (1,200 mg total) by mouth 2 (two) times daily. 20 tablet 0  . hydrOXYzine (ATARAX) 25 MG tablet Take 25 mg by mouth 2 (two) times daily as needed for itching.     . Insulin Degludec (TRESIBA FLEXTOUCH) 100 UNIT/ML SOPN Inject 120 Units into the skin daily. And pen needles 1/day 15 pen 11  . lisinopril-hydrochlorothiazide (PRINZIDE,ZESTORETIC) 20-25 MG tablet Take 1 tablet by mouth daily as needed (blood pressure).   5  . loperamide (IMODIUM) 2 MG capsule Take 2 mg by mouth as needed for diarrhea or loose stools.     . mycophenolate (MYFORTIC) 360 MG TBEC Take 720 mg by mouth 2 (two) times  daily.     . pantoprazole (PROTONIX) 20 MG tablet Take 1 tablet (20 mg total) by mouth daily. 30 tablet 0  . predniSONE (DELTASONE) 5 MG tablet Take 5 mg by mouth at bedtime.     . promethazine (PHENERGAN) 12.5 MG tablet Take 1 tablet (12.5 mg total) by mouth every 6 (six) hours as needed for nausea or vomiting. 30 tablet 0  . simvastatin (ZOCOR) 20 MG tablet Take 20 mg by mouth 3 (three) times a week. MWF.    Marland Kitchen tacrolimus (PROGRAF) 1 MG capsule Take 2 mg by mouth 2 (two) times daily.      No current facility-administered medications on file prior to visit.     Allergies  Allergen Reactions  . Benadryl [Diphenhydramine-Zinc Acetate] Shortness Of Breath  . Motrin [Ibuprofen] Shortness Of Breath and Itching    Per pt  . Banana Other (See Comments)    Sick on the stomach  . Diphenhydramine Hcl     REACTION: Stopped breathing in ICU  . Doxycycline     Shortness of Breath   . Iron Dextran     REACTION: vein irritation  . Shellfish Allergy Hives    Family History  Problem Relation Age of Onset  . Arthritis Mother   . Diabetes Mother   . Hypertension Mother    BP 122/80   Pulse 92   Ht 5\' 6"  (1.676 m)   Wt 182 lb (82.6 kg)   SpO2 99%   BMI 29.38 kg/m   Review of Systems She denies hypoglycemia.     Objective:   Physical Exam VITAL SIGNS:  See vs page GENERAL: no distress Pulses: dorsalis pedis intact bilat.   MSK: no deformity of the feet CV: no leg edema Skin:  no ulcer on the feet.  The ulcer on the left leg is bandaged (sees would care).  normal color and temp on the feet.  Neuro: sensation is intact to touch on the feet Ext: There is bilateral onychomycosis of the toenails  A1c=12%      Assessment & Plan:  Type 1 DM, with renal failure, worse.  Noncompliance with insulin dosing.  This is limiting rx of DM  Patient is advised the following: Patient Instructions  Please increase the tresiba to 120 units daily.  If you miss a shot, take it as soon as you  remember.  You should take this no matter what your blood sugar is.   On this type of insulin schedule, you should eat meals on a regular schedule.  If a meal is missed or significantly delayed, your blood sugar could go low.   Please come back  for a follow-up appointment in 3 months.   check your blood sugar 4 times a day: before the 3 meals, and at bedtime.  also check if you have symptoms of your blood sugar being too high or too low.  please keep a record of the readings and bring it to your next appointment here.  You can write it on any piece of paper.  please call us sooner if your blood sugar goes below 70, or if you have a lot of readings over 200.

## 2016-01-29 ENCOUNTER — Encounter: Payer: Medicare Other | Attending: Surgery | Admitting: Surgery

## 2016-01-29 DIAGNOSIS — K219 Gastro-esophageal reflux disease without esophagitis: Secondary | ICD-10-CM | POA: Diagnosis not present

## 2016-01-29 DIAGNOSIS — E785 Hyperlipidemia, unspecified: Secondary | ICD-10-CM | POA: Diagnosis not present

## 2016-01-29 DIAGNOSIS — E11622 Type 2 diabetes mellitus with other skin ulcer: Secondary | ICD-10-CM | POA: Diagnosis not present

## 2016-01-29 DIAGNOSIS — D649 Anemia, unspecified: Secondary | ICD-10-CM | POA: Insufficient documentation

## 2016-01-29 DIAGNOSIS — I890 Lymphedema, not elsewhere classified: Secondary | ICD-10-CM | POA: Diagnosis not present

## 2016-01-29 DIAGNOSIS — I10 Essential (primary) hypertension: Secondary | ICD-10-CM | POA: Insufficient documentation

## 2016-01-29 DIAGNOSIS — M1990 Unspecified osteoarthritis, unspecified site: Secondary | ICD-10-CM | POA: Diagnosis not present

## 2016-01-29 DIAGNOSIS — F419 Anxiety disorder, unspecified: Secondary | ICD-10-CM | POA: Diagnosis not present

## 2016-01-29 DIAGNOSIS — I83023 Varicose veins of left lower extremity with ulcer of ankle: Secondary | ICD-10-CM | POA: Diagnosis not present

## 2016-01-29 DIAGNOSIS — S81821A Laceration with foreign body, right lower leg, initial encounter: Secondary | ICD-10-CM | POA: Diagnosis not present

## 2016-01-29 DIAGNOSIS — E13622 Other specified diabetes mellitus with other skin ulcer: Secondary | ICD-10-CM | POA: Diagnosis not present

## 2016-01-29 DIAGNOSIS — G40909 Epilepsy, unspecified, not intractable, without status epilepticus: Secondary | ICD-10-CM | POA: Insufficient documentation

## 2016-01-29 DIAGNOSIS — Z794 Long term (current) use of insulin: Secondary | ICD-10-CM | POA: Insufficient documentation

## 2016-01-29 DIAGNOSIS — F329 Major depressive disorder, single episode, unspecified: Secondary | ICD-10-CM | POA: Insufficient documentation

## 2016-01-29 DIAGNOSIS — Z940 Kidney transplant status: Secondary | ICD-10-CM | POA: Insufficient documentation

## 2016-01-29 DIAGNOSIS — L97222 Non-pressure chronic ulcer of left calf with fat layer exposed: Secondary | ICD-10-CM | POA: Insufficient documentation

## 2016-01-29 DIAGNOSIS — I739 Peripheral vascular disease, unspecified: Secondary | ICD-10-CM | POA: Diagnosis not present

## 2016-01-30 NOTE — Progress Notes (Signed)
Valerie, [name] ([id number]) Visit Report for 01/29/2016 Arrival Information Details Patient Name: [name], [name] Date of Service: 01/29/2016 12:45 PM Medical Record Number: [id number] Patient Account Number: [id number] Date of Birth/Sex: [birth year] (30 y.o. Female) Treating RN: Carolyne Fiscal, Debi Primary Care Physician: [name] Other Clinician: Referring Physician: [name] Treating Physician/Extender: Frann Rider in Treatment: 9 Visit Information History Since Last Visit All ordered tests and consults were completed: No Patient Arrived: Ambulatory Added or deleted any medications: No Arrival Time: 12:57 Any new allergies or adverse reactions: No Accompanied By: self Had a fall or experienced change in No Transfer Assistance: None activities of daily living that may affect Patient Identification Verified: Yes risk of falls: Secondary Verification Process Yes Signs or symptoms of abuse/neglect since last No Completed: visito Patient Requires Transmission- No Hospitalized since last visit: No Based Precautions: Pain Present Now: No Patient Has Alerts: Yes Patient Alerts: DM II PT REFUSES FACE PHOTO Electronic Signature(s) Signed: 01/29/2016 4:54:36 PM By: Alric Quan Entered By: Alric Quan on 01/29/2016 12:57:30 [name] ([id number]) -------------------------------------------------------------------------------- Clinic Level of Care Assessment Details Patient Name: [name] Date of Service: 01/29/2016 12:45 PM Medical Record Number: [id number] Patient Account Number: [id number] Date of Birth/Sex: 07/28/1985 (30 y.o. Female) Treating RN: Carolyne Fiscal, Debi Primary Care Physician: [name] Other Clinician: Referring Physician: [name] Treating Physician/Extender: Frann Rider in Treatment: 9 Clinic Level of Care Assessment Items TOOL 4 Quantity Score X - Use when only an EandM is performed on FOLLOW-UP visit 1  0 ASSESSMENTS - Nursing Assessment / Reassessment X - Reassessment of Co-morbidities (includes updates in patient status) 1 10 X - Reassessment of Adherence to Treatment Plan 1 5 ASSESSMENTS - Wound and Skin Assessment / Reassessment X - Simple Wound Assessment / Reassessment - one wound 1 5 []  - Complex Wound Assessment / Reassessment - multiple wounds 0 []  - Dermatologic / Skin Assessment (not related to wound area) 0 ASSESSMENTS - Focused Assessment []  - Circumferential Edema Measurements - multi extremities 0 []  - Nutritional Assessment / Counseling / Intervention 0 []  - Lower Extremity Assessment (monofilament, tuning fork, pulses) 0 []  - Peripheral Arterial Disease Assessment (using hand held doppler) 0 ASSESSMENTS - Ostomy and/or Continence Assessment and Care []  - Incontinence Assessment and Management 0 []  - Ostomy Care Assessment and Management (repouching, etc.) 0 PROCESS - Coordination of Care X - Simple Patient / Family Education for ongoing care 1 15 []  - Complex (extensive) Patient / Family Education for ongoing care 0 X - Staff obtains Programmer, systems, Records, Test Results / Process Orders 1 10 []  - Staff telephones HHA, Nursing Homes / Clarify orders / etc 0 []  - Routine Transfer to another Facility (non-emergent condition) 0 DESHARA, ROSSI ([id number]) []  - Routine Hospital Admission (non-emergent condition) 0 []  - New Admissions / Biomedical engineer / Ordering NPWT, Apligraf, etc. 0 []  - Emergency Hospital Admission (emergent condition) 0 X - Simple Discharge Coordination 1 10 []  - Complex (extensive) Discharge Coordination 0 PROCESS - Special Needs []  - Pediatric / Minor Patient Management 0 []  - Isolation Patient Management 0 []  - Hearing / Language / Visual special needs 0 []  - Assessment of Community assistance (transportation, D/C planning, etc.) 0 []  - Additional assistance / Altered mentation 0 []  - Support Surface(s) Assessment (bed, cushion, seat, etc.)  0 INTERVENTIONS - Wound Cleansing / Measurement X - Simple Wound Cleansing - one wound 1 5 []  - Complex Wound Cleansing - multiple wounds 0 X - Wound  Imaging (photographs - any number of wounds) 1 5 []  - Wound Tracing (instead of photographs) 0 []  - Simple Wound Measurement - one wound 0 []  - Complex Wound Measurement - multiple wounds 0 INTERVENTIONS - Wound Dressings []  - Small Wound Dressing one or multiple wounds 0 []  - Medium Wound Dressing one or multiple wounds 0 []  - Large Wound Dressing one or multiple wounds 0 []  - Application of Medications - topical 0 []  - Application of Medications - injection 0 INTERVENTIONS - Miscellaneous []  - External ear exam 0 ALLEYA, DEMETER ([id number]) []  - Specimen Collection (cultures, biopsies, blood, body fluids, etc.) 0 []  - Specimen(s) / Culture(s) sent or taken to Lab for analysis 0 []  - Patient Transfer (multiple staff / Harrel Lemon Lift / Similar devices) 0 []  - Simple Staple / Suture removal (25 or less) 0 []  - Complex Staple / Suture removal (26 or more) 0 []  - Hypo / Hyperglycemic Management (close monitor of Blood Glucose) 0 []  - Ankle / Brachial Index (ABI) - do not check if billed separately 0 X - Vital Signs 1 5 Has the patient been seen at the hospital within the last three years: Yes Total Score: 70 Level Of Care: New/Established - Level 2 Electronic Signature(s) Signed: 01/29/2016 4:54:36 PM By: Alric Quan Entered By: Alric Quan on 01/29/2016 13:12:04 [name] ([id number]) -------------------------------------------------------------------------------- Encounter Discharge Information Details Patient Name: [name] Date of Service: 01/29/2016 12:45 PM Medical Record Number: [id number] Patient Account Number: [id number] Date of Birth/Sex: 12-23-1985 (30 y.o. Female) Treating RN: Carolyne Fiscal, Debi Primary Care Physician: [name] Other Clinician: Referring Physician: [name] Treating  Physician/Extender: Frann Rider in Treatment: 9 Encounter Discharge Information Items Discharge Pain Level: 0 Discharge Condition: Stable Ambulatory Status: Ambulatory Discharge Destination: Home Transportation: Private Auto Accompanied By: self Schedule Follow-up Appointment: No Medication Reconciliation completed and provided to Patient/Care Yes Tamika Nou: Provided on Clinical Summary of Care: 01/29/2016 Form Type Recipient Paper Patient DS Electronic Signature(s) Signed: 01/29/2016 1:07:25 PM By: Ruthine Dose Entered By: Ruthine Dose on 01/29/2016 13:07:25 [name] ([id number]) -------------------------------------------------------------------------------- Lower Extremity Assessment Details Patient Name: [name] Date of Service: 01/29/2016 12:45 PM Medical Record Number: [id number] Patient Account Number: [id number] Date of Birth/Sex: 1986/02/13 (30 y.o. Female) Treating RN: Carolyne Fiscal, Debi Primary Care Physician: [name] Other Clinician: Referring Physician: [name] Treating Physician/Extender: Frann Rider in Treatment: 9 Vascular Assessment Pulses: Posterior Tibial Dorsalis Pedis Palpable: [Left:Yes] Extremity colors, hair growth, and conditions: Extremity Color: [Left:Normal] Temperature of Extremity: [Left:Warm] Capillary Refill: [Left:< 3 seconds] Toe Nail Assessment Left: Right: Thick: No Discolored: No Deformed: No Improper Length and Hygiene: No Electronic Signature(s) Signed: 01/29/2016 4:54:36 PM By: Alric Quan Entered By: Alric Quan on 01/29/2016 13:00:00 [name] ([id number]) -------------------------------------------------------------------------------- Multi Wound Chart Details Patient Name: [name] Date of Service: 01/29/2016 12:45 PM Medical Record Number: [id number] Patient Account Number: [id number] Date of Birth/Sex: 02-19-1986 (30 y.o. Female) Treating RN: Carolyne Fiscal,  Debi Primary Care Physician: [name] Other Clinician: Referring Physician: [name] Treating Physician/Extender: Frann Rider in Treatment: 9 Vital Signs Height(in): 66 Pulse(bpm): 96 Weight(lbs): 180 Blood Pressure 138/92 (mmHg): Body Mass Index(BMI): 29 Temperature(F): 98.2 Respiratory Rate 20 (breaths/min): Photos: [1:No Photos] [N/A:N/A] Wound Location: [1:Left Lower Leg - Medial] [N/A:N/A] Wounding Event: [1:Bite] [N/A:N/A] Primary Etiology: [1:Diabetic Wound/Ulcer of the Lower Extremity] [N/A:N/A] Comorbid History: [1:Anemia, Hypertension, Type II Diabetes, Osteoarthritis, Seizure Disorder] [N/A:N/A] Date Acquired: [1:10/17/2015] [N/A:N/A] Weeks of Treatment: [1:9] [N/A:N/A] Wound Status: [1:Open] [  N/A:N/A] Measurements L x W x D 0x0x0 [N/A:N/A] (cm) Area (cm) : [1:0] [N/A:N/A] Volume (cm) : [1:0] [N/A:N/A] % Reduction in Area: [1:100.00%] [N/A:N/A] % Reduction in Volume: 100.00% [N/A:N/A] Classification: [1:Grade 1] [N/A:N/A] Exudate Amount: [1:None Present] [N/A:N/A] Wound Margin: [1:Distinct, outline attached] [N/A:N/A] Granulation Amount: [1:None Present (0%)] [N/A:N/A] Necrotic Amount: [1:None Present (0%)] [N/A:N/A] Exposed Structures: [1:Fascia: No Fat: No Tendon: No Muscle: No Joint: No Bone: No] [N/A:N/A] Limited to Skin Breakdown Epithelialization: Large (67-100%) N/A N/A Periwound Skin Texture: Edema: No N/A N/A Induration: No Periwound Skin Moist: No N/A N/A Moisture: Periwound Skin Color: Hemosiderin Staining: No N/A N/A Temperature: No Abnormality N/A N/A Tenderness on Yes N/A N/A Palpation: Wound Preparation: Ulcer Cleansing: N/A N/A Rinsed/Irrigated with Saline Topical Anesthetic Applied: None Treatment Notes Electronic Signature(s) Signed: 01/29/2016 4:54:36 PM By: Alric Quan Entered By: Alric Quan on 01/29/2016 13:02:38 [name]  ([id number]) -------------------------------------------------------------------------------- [place] Details Patient Name: [name] Date of Service: 01/29/2016 12:45 PM Medical Record Number: [id number] Patient Account Number: [id number] Date of Birth/Sex: 06/03/1985 (30 y.o. Female) Treating RN: Ahmed Prima Primary Care Physician: [name] Other Clinician: Referring Physician: [name] Treating Physician/Extender: Frann Rider in Treatment: 9 Active Inactive Electronic Signature(s) Signed: 01/29/2016 4:54:36 PM By: Alric Quan Entered By: Alric Quan on 01/29/2016 13:02:32 [name] ([id number]) -------------------------------------------------------------------------------- Pain Assessment Details Patient Name: [name] Date of Service: 01/29/2016 12:45 PM Medical Record Number: [id number] Patient Account Number: [id number] Date of Birth/Sex: Oct 15, 1985 (30 y.o. Female) Treating RN: Carolyne Fiscal, Debi Primary Care Physician: [name] Other Clinician: Referring Physician: [name] Treating Physician/Extender: Frann Rider in Treatment: 9 Active Problems Location of Pain Severity and Description of Pain Patient Has Paino No Site Locations With Dressing Change: No Pain Management and Medication Current Pain Management: Electronic Signature(s) Signed: 01/29/2016 4:54:36 PM By: Alric Quan Entered By: Alric Quan on 01/29/2016 12:57:44 [name] ([id number]) -------------------------------------------------------------------------------- Patient/Caregiver Education Details Patient Name: [name] Date of Service: 01/29/2016 12:45 PM Medical Record Number: [id number] Patient Account Number: [id number] Date of Birth/Gender: [birth year] (30 y.o. Female) Treating RN: Carolyne Fiscal, Debi Primary Care Physician: [name] Other Clinician: Referring Physician: [name] Treating Physician/Extender: Frann Rider in Treatment: 9 Education Assessment Education Provided To: Patient Education Topics Provided Wound/Skin Impairment: Handouts: Other: Call our office if you have any questions or concerns. Methods: Explain/Verbal Responses: State content correctly Electronic Signature(s) Signed: 01/29/2016 4:54:36 PM By: Alric Quan Entered By: Alric Quan on 01/29/2016 13:03:41 [name] ([id number]) -------------------------------------------------------------------------------- Wound Assessment Details Patient Name: [name] Date of Service: 01/29/2016 12:45 PM Medical Record Number: [id number] Patient Account Number: [id number] Date of Birth/Sex: October 09, 1985 (30 y.o. Female) Treating RN: Carolyne Fiscal, Debi Primary Care Physician: [name] Other Clinician: Referring Physician: [name] Treating Physician/Extender: Frann Rider in Treatment: 9 Wound Status Wound Number: 1 Primary Diabetic Wound/Ulcer of the Lower Etiology: Extremity Wound Location: Left Lower Leg - Medial Wound Open Wounding Event: Bite Status: Date Acquired: 10/17/2015 Comorbid Anemia, Hypertension, Type II Weeks Of Treatment: 9 History: Diabetes, Osteoarthritis, Seizure Clustered Wound: No Disorder Photos Photo Uploaded By: Alric Quan on 01/29/2016 13:13:06 Wound Measurements Length: (cm) 0 % Reductio Width: (cm) 0 % Reductio Depth: (cm) 0 Epithelial Area: (cm) 0 Tunneling Volume: (cm) 0 Undermini n in Area: 100% n in Volume: 100% ization: Large (67-100%) : No ng: No Wound Description Classification: Grade 1 Wound Margin: Distinct, outline attached Exudate Amount: None Present Foul Odor After Cleansing: No Wound  Bed Granulation Amount: None Present (0%) Exposed Structure Necrotic Amount: None Present (0%) Fascia Exposed: No Fat Layer Exposed: No Tendon Exposed: No Muscle Exposed: No Joint Exposed:  No Bone Exposed: No Limited to Skin Breakdown JOLANDA, MCCANN ([id number]) Periwound Skin Texture Texture Color No Abnormalities Noted: No No Abnormalities Noted: No Induration: No Hemosiderin Staining: No Localized Edema: No Temperature / Pain Moisture Temperature: No Abnormality No Abnormalities Noted: No Tenderness on Palpation: Yes Moist: No Wound Preparation Ulcer Cleansing: Rinsed/Irrigated with Saline Topical Anesthetic Applied: None Electronic Signature(s) Signed: 01/29/2016 4:54:36 PM By: Alric Quan Entered By: Alric Quan on 01/29/2016 13:02:10 [name] ([id number]) -------------------------------------------------------------------------------- Vitals Details Patient Name: [name] Date of Service: 01/29/2016 12:45 PM Medical Record Number: [id number] Patient Account Number: [id number] Date of Birth/Sex: 09/30/1985 (30 y.o. Female) Treating RN: Carolyne Fiscal, Debi Primary Care Physician: [name] Other Clinician: Referring Physician: [name] Treating Physician/Extender: Frann Rider in Treatment: 9 Vital Signs Time Taken: 12:57 Temperature (F): 98.2 Height (in): 66 Pulse (bpm): 96 Weight (lbs): 180 Respiratory Rate (breaths/min): 20 Body Mass Index (BMI): 29 Blood Pressure (mmHg): 138/92 Reference Range: 80 - 120 mg / dl Electronic Signature(s) Signed: 01/29/2016 4:54:36 PM By: Alric Quan Entered By: Alric Quan on 01/29/2016 12:59:42
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
## 2016-02-04 ENCOUNTER — Encounter (HOSPITAL_COMMUNITY)
Admission: RE | Admit: 2016-02-04 | Discharge: 2016-02-04 | Disposition: A | Discharge status: Home / Self Care | Payer: Medicare Other | Source: Ambulatory Visit | Attending: Nephrology | Admitting: Nephrology

## 2016-02-04 DIAGNOSIS — N183 Chronic kidney disease, stage 3 (moderate): Secondary | ICD-10-CM | POA: Diagnosis not present

## 2016-02-04 DIAGNOSIS — N182 Chronic kidney disease, stage 2 (mild): Secondary | ICD-10-CM

## 2016-02-04 DIAGNOSIS — D631 Anemia in chronic kidney disease: Secondary | ICD-10-CM | POA: Diagnosis not present

## 2016-02-04 LAB — IRON AND TIBC
IRON: 56 ug/dL (ref 28–170)
Saturation Ratios: 22 % (ref 10.4–31.8)
TIBC: 260 ug/dL (ref 250–450)
UIBC: 204 ug/dL

## 2016-02-04 LAB — FERRITIN: Ferritin: 1054 ng/mL — ABNORMAL HIGH (ref 11–307)

## 2016-02-04 LAB — POCT HEMOGLOBIN-HEMACUE: HEMOGLOBIN: 11.2 g/dL — AB (ref 12.0–15.0)

## 2016-02-04 MED ORDER — EPOETIN ALFA 20000 UNIT/ML IJ SOLN
INTRAMUSCULAR | Status: AC
Start: 2016-02-04 — End: 2016-02-04
  Administered 2016-02-04: 20000 [IU]
  Filled 2016-02-04: qty 1

## 2016-02-04 MED ORDER — EPOETIN ALFA 10000 UNIT/ML IJ SOLN
INTRAMUSCULAR | Status: AC
Start: 2016-02-04 — End: 2016-02-04
  Administered 2016-02-04: 10000 [IU]
  Filled 2016-02-04: qty 1

## 2016-02-04 MED ORDER — EPOETIN ALFA 40000 UNIT/ML IJ SOLN: 30000.0000 [IU] | INTRAMUSCULAR | Status: DC

## 2016-02-19 ENCOUNTER — Encounter (HOSPITAL_COMMUNITY)
Admission: RE | Admit: 2016-02-19 | Discharge: 2016-02-19 | Disposition: A | Discharge status: Home / Self Care | Payer: Medicare Other | Source: Ambulatory Visit | Attending: Nephrology | Admitting: Nephrology

## 2016-02-19 DIAGNOSIS — D631 Anemia in chronic kidney disease: Secondary | ICD-10-CM | POA: Diagnosis not present

## 2016-02-19 DIAGNOSIS — N183 Chronic kidney disease, stage 3 (moderate): Secondary | ICD-10-CM | POA: Insufficient documentation

## 2016-02-19 DIAGNOSIS — N182 Chronic kidney disease, stage 2 (mild): Secondary | ICD-10-CM

## 2016-02-19 LAB — POCT HEMOGLOBIN-HEMACUE: Hemoglobin: 11 g/dL — ABNORMAL LOW (ref 12.0–15.0)

## 2016-02-19 MED ORDER — EPOETIN ALFA 20000 UNIT/ML IJ SOLN
INTRAMUSCULAR | Status: AC
Start: 2016-02-19 — End: 2016-02-19
  Administered 2016-02-19: 20000 [IU]
  Filled 2016-02-19: qty 1

## 2016-02-19 MED ORDER — EPOETIN ALFA 10000 UNIT/ML IJ SOLN
INTRAMUSCULAR | Status: AC
  Administered 2016-02-19: 10000 [IU]
  Filled 2016-02-19: qty 1

## 2016-02-19 MED ORDER — EPOETIN ALFA 40000 UNIT/ML IJ SOLN: 30000.0000 [IU] | INTRAMUSCULAR | Status: DC

## 2016-03-04 ENCOUNTER — Encounter (HOSPITAL_COMMUNITY)
Admission: RE | Admit: 2016-03-04 | Discharge: 2016-03-04 | Disposition: A | Discharge status: Home / Self Care | Payer: Medicare Other | Source: Ambulatory Visit | Attending: Nephrology | Admitting: Nephrology

## 2016-03-04 DIAGNOSIS — D631 Anemia in chronic kidney disease: Secondary | ICD-10-CM | POA: Diagnosis not present

## 2016-03-04 DIAGNOSIS — N182 Chronic kidney disease, stage 2 (mild): Secondary | ICD-10-CM

## 2016-03-04 DIAGNOSIS — N183 Chronic kidney disease, stage 3 (moderate): Secondary | ICD-10-CM | POA: Diagnosis not present

## 2016-03-04 LAB — IRON AND TIBC
Iron: 51 ug/dL (ref 28–170)
SATURATION RATIOS: 22 % (ref 10.4–31.8)
TIBC: 237 ug/dL — ABNORMAL LOW (ref 250–450)
UIBC: 186 ug/dL

## 2016-03-04 LAB — POCT HEMOGLOBIN-HEMACUE: HEMOGLOBIN: 11.8 g/dL — AB (ref 12.0–15.0)

## 2016-03-04 LAB — FERRITIN: FERRITIN: 936 ng/mL — AB (ref 11–307)

## 2016-03-04 MED ORDER — EPOETIN ALFA 10000 UNIT/ML IJ SOLN
INTRAMUSCULAR | Status: AC
  Filled 2016-03-04: qty 1

## 2016-03-04 MED ORDER — EPOETIN ALFA 20000 UNIT/ML IJ SOLN
INTRAMUSCULAR | Status: AC
  Filled 2016-03-04: qty 1

## 2016-03-04 MED ORDER — EPOETIN ALFA 40000 UNIT/ML IJ SOLN
30000.0000 [IU] | INTRAMUSCULAR | Status: DC
  Administered 2016-03-04: 30000 [IU] via SUBCUTANEOUS

## 2016-03-05 MED FILL — Epoetin Alfa Inj 20000 Unit/ML: INTRAMUSCULAR | Qty: 1 | Status: AC

## 2016-03-05 MED FILL — Epoetin Alfa Inj 10000 Unit/ML: INTRAMUSCULAR | Qty: 1 | Status: AC

## 2016-03-18 ENCOUNTER — Encounter (HOSPITAL_COMMUNITY)
Admission: RE | Admit: 2016-03-18 | Discharge: 2016-03-18 | Disposition: A | Discharge status: Home / Self Care | Payer: Medicare Other | Source: Ambulatory Visit | Attending: Nephrology | Admitting: Nephrology

## 2016-03-18 DIAGNOSIS — D631 Anemia in chronic kidney disease: Secondary | ICD-10-CM | POA: Insufficient documentation

## 2016-03-18 DIAGNOSIS — S81802A Unspecified open wound, left lower leg, initial encounter: Secondary | ICD-10-CM | POA: Diagnosis not present

## 2016-03-18 DIAGNOSIS — E785 Hyperlipidemia, unspecified: Secondary | ICD-10-CM | POA: Diagnosis not present

## 2016-03-18 DIAGNOSIS — I10 Essential (primary) hypertension: Secondary | ICD-10-CM | POA: Diagnosis not present

## 2016-03-18 DIAGNOSIS — E139 Other specified diabetes mellitus without complications: Secondary | ICD-10-CM | POA: Diagnosis not present

## 2016-03-18 DIAGNOSIS — L299 Pruritus, unspecified: Secondary | ICD-10-CM | POA: Diagnosis not present

## 2016-03-18 DIAGNOSIS — Z9889 Other specified postprocedural states: Secondary | ICD-10-CM | POA: Diagnosis not present

## 2016-03-18 DIAGNOSIS — Z940 Kidney transplant status: Secondary | ICD-10-CM | POA: Diagnosis not present

## 2016-03-18 DIAGNOSIS — N182 Chronic kidney disease, stage 2 (mild): Secondary | ICD-10-CM

## 2016-03-18 DIAGNOSIS — N183 Chronic kidney disease, stage 3 (moderate): Secondary | ICD-10-CM | POA: Insufficient documentation

## 2016-03-18 DIAGNOSIS — Z6829 Body mass index (BMI) 29.0-29.9, adult: Secondary | ICD-10-CM | POA: Diagnosis not present

## 2016-03-18 DIAGNOSIS — D649 Anemia, unspecified: Secondary | ICD-10-CM | POA: Diagnosis not present

## 2016-03-18 LAB — POCT HEMOGLOBIN-HEMACUE: Hemoglobin: 10.5 g/dL — ABNORMAL LOW (ref 12.0–15.0)

## 2016-03-18 MED ORDER — EPOETIN ALFA 10000 UNIT/ML IJ SOLN
INTRAMUSCULAR | Status: AC
Start: 2016-03-18 — End: 2016-03-18
  Administered 2016-03-18: 10000 [IU]
  Filled 2016-03-18: qty 1

## 2016-03-18 MED ORDER — EPOETIN ALFA 20000 UNIT/ML IJ SOLN
INTRAMUSCULAR | Status: AC
Start: 2016-03-18 — End: 2016-03-18
  Administered 2016-03-18: 20000 [IU]
  Filled 2016-03-18: qty 1

## 2016-03-18 MED ORDER — EPOETIN ALFA 40000 UNIT/ML IJ SOLN: 30000.0000 [IU] | INTRAMUSCULAR | Status: DC

## 2016-04-01 ENCOUNTER — Encounter (HOSPITAL_COMMUNITY)
Admission: RE | Admit: 2016-04-01 | Discharge: 2016-04-01 | Disposition: A | Discharge status: Home / Self Care | Payer: Medicare Other | Source: Ambulatory Visit | Attending: Nephrology | Admitting: Nephrology

## 2016-04-01 DIAGNOSIS — D631 Anemia in chronic kidney disease: Secondary | ICD-10-CM | POA: Diagnosis not present

## 2016-04-01 DIAGNOSIS — N182 Chronic kidney disease, stage 2 (mild): Secondary | ICD-10-CM

## 2016-04-01 DIAGNOSIS — N183 Chronic kidney disease, stage 3 (moderate): Secondary | ICD-10-CM | POA: Diagnosis not present

## 2016-04-01 LAB — IRON AND TIBC
IRON: 69 ug/dL (ref 28–170)
Saturation Ratios: 28 % (ref 10.4–31.8)
TIBC: 244 ug/dL — ABNORMAL LOW (ref 250–450)
UIBC: 175 ug/dL

## 2016-04-01 LAB — FERRITIN: Ferritin: 979 ng/mL — ABNORMAL HIGH (ref 11–307)

## 2016-04-01 LAB — POCT HEMOGLOBIN-HEMACUE: Hemoglobin: 10.7 g/dL — ABNORMAL LOW (ref 12.0–15.0)

## 2016-04-01 MED ORDER — EPOETIN ALFA 20000 UNIT/ML IJ SOLN
INTRAMUSCULAR | Status: AC
  Administered 2016-04-01: 20000 [IU]
  Filled 2016-04-01: qty 1

## 2016-04-01 MED ORDER — EPOETIN ALFA 10000 UNIT/ML IJ SOLN
INTRAMUSCULAR | Status: AC
  Administered 2016-04-01: 10000 [IU]
  Filled 2016-04-01: qty 1

## 2016-04-01 MED ORDER — EPOETIN ALFA 40000 UNIT/ML IJ SOLN: 30000.0000 [IU] | INTRAMUSCULAR | Status: DC

## 2016-04-12 ENCOUNTER — Other Ambulatory Visit: Payer: Self-pay | Admitting: Endocrinology

## 2016-04-14 ENCOUNTER — Other Ambulatory Visit (HOSPITAL_COMMUNITY): Payer: Self-pay | Admitting: *Deleted

## 2016-04-15 ENCOUNTER — Encounter (HOSPITAL_COMMUNITY)
Admission: RE | Admit: 2016-04-15 | Discharge: 2016-04-15 | Disposition: A | Discharge status: Home / Self Care | Payer: Medicare Other | Source: Ambulatory Visit | Attending: Nephrology | Admitting: Nephrology

## 2016-04-15 DIAGNOSIS — D631 Anemia in chronic kidney disease: Secondary | ICD-10-CM | POA: Diagnosis not present

## 2016-04-15 DIAGNOSIS — N183 Chronic kidney disease, stage 3 (moderate): Secondary | ICD-10-CM | POA: Diagnosis not present

## 2016-04-15 DIAGNOSIS — N182 Chronic kidney disease, stage 2 (mild): Secondary | ICD-10-CM

## 2016-04-15 LAB — POCT HEMOGLOBIN-HEMACUE: HEMOGLOBIN: 10.9 g/dL — AB (ref 12.0–15.0)

## 2016-04-15 MED ORDER — EPOETIN ALFA 20000 UNIT/ML IJ SOLN
INTRAMUSCULAR | Status: AC
  Administered 2016-04-15: 20000 [IU] via SUBCUTANEOUS
  Filled 2016-04-15: qty 1

## 2016-04-15 MED ORDER — EPOETIN ALFA 40000 UNIT/ML IJ SOLN: 30000.0000 [IU] | INTRAMUSCULAR | Status: DC

## 2016-04-15 MED ORDER — EPOETIN ALFA 10000 UNIT/ML IJ SOLN
INTRAMUSCULAR | Status: AC
  Administered 2016-04-15: 10000 [IU] via SUBCUTANEOUS
  Filled 2016-04-15: qty 1

## 2016-04-15 NOTE — Progress Notes (Signed)
Pt was 25 minutes late for her appointment.  We explained to the patient that in the future if she was 30 mins we will reschedule her appointment.  Pt is 30+ minutes late for almost every appointment. Pt verbalized understanding.

## 2016-04-22 ENCOUNTER — Encounter: Payer: Self-pay | Admitting: Endocrinology

## 2016-04-22 ENCOUNTER — Ambulatory Visit (INDEPENDENT_AMBULATORY_CARE_PROVIDER_SITE_OTHER): Payer: Medicare Other | Admitting: Endocrinology

## 2016-04-22 VITALS — BP 130/80 | HR 92 | Wt 183.4 lb

## 2016-04-22 DIAGNOSIS — E1022 Type 1 diabetes mellitus with diabetic chronic kidney disease: Secondary | ICD-10-CM | POA: Diagnosis not present

## 2016-04-22 DIAGNOSIS — N185 Chronic kidney disease, stage 5: Secondary | ICD-10-CM

## 2016-04-22 LAB — POCT GLYCOSYLATED HEMOGLOBIN (HGB A1C): Hemoglobin A1C: 12.3

## 2016-04-22 MED ORDER — INSULIN DEGLUDEC 100 UNIT/ML ~~LOC~~ SOPN: 120.0000 [IU] | PEN_INJECTOR | Freq: Every day | SUBCUTANEOUS | 11 refills | Status: DC

## 2016-04-22 NOTE — Progress Notes (Signed)
Pre visit review using our clinic review tool, if applicable. No additional management support is needed unless otherwise documented below in the visit note. 

## 2016-04-22 NOTE — Patient Instructions (Addendum)
Please continue the tresiba, 120 units daily.  If you miss a shot, take it as soon as you remember.  You should take this no matter what your blood sugar is.   Your a1c is really high, so it is critically important to take the full amount.  On this type of insulin schedule, you should eat meals on a regular schedule.  If a meal is missed or significantly delayed, your blood sugar could go low.   Please come back for a follow-up appointment in 3 months.   check your blood sugar 4 times a day: before the 3 meals, and at bedtime.  also check if you have symptoms of your blood sugar being too high or too low.  please keep a record of the readings and bring it to your next appointment here.  You can write it on any piece of paper.  please call us sooner if your blood sugar goes below 70, or if you have a lot of readings over 200.

## 2016-04-22 NOTE — Progress Notes (Signed)
Subjective:    Patient ID: [name], female    DOB: [birth year], 31 y.o.   MRN: [id number]  HPI Pt returns for f/u of diabetes mellitus: DM type: 1 Dx'ed: 2012, when she was on prednisone Complications: polyneuropathy and renal failure (transplant in 2007).   Therapy: insulin since soon after dx.   GDM: (G0).   DKA: several episodes, most recently in mid-2016.  Severe hypoglycemia: never.   Pancreatitis: never.  Other: she took pump rx during 2016, but was changed to qd insulin, due to poor results; she is chronically on prednisone; pt has had TAH; learning disability appears to be contributing to chronic noncompliance.   Interval history: she brings a scant record of her cbg's which i have reviewed today.  It varies from 157-500.  She says she never misses the insulin, but says she continues to take a widely varying dosage of insulin (usually less than prescribed amount), according to cbg's.   Past Medical History:  Diagnosis Date  . Chronic kidney disease   . Diabetes mellitus    Pt reports diagnosis in June 2011  . Hyperlipidemia   . Hypertension   . Kidney transplant recipient   . UTI (urinary tract infection) 01/09/2015    Past Surgical History:  Procedure Laterality Date  . KIDNEY TRANSPLANT  2007  . RENAL BIOPSY Bilateral 2003  . THYROID LOBECTOMY      Social History   Social History  . Marital status: Single    Spouse name: N/A  . Number of children: N/A  . Years of education: N/A   Occupational History  . Not on file.   Social History Main Topics  . Smoking status: Never Smoker  . Smokeless tobacco: Never Used  . Alcohol use No  . Drug use: No  . Sexual activity: Yes    Birth control/ protection: None   Other Topics Concern  . Not on file   Social History Narrative  . No narrative on file    Current Outpatient Prescriptions on File Prior to Visit  Medication Sig Dispense Refill  . ACCU-CHEK SOFTCLIX LANCETS lancets Use to check blood sugar  4 times per day. 150 each 5  . ACCU-CHEK SOFTCLIX LANCETS lancets USE TO CHECK BLOOD SUGAR FOUR TIMES DAILY AS DIRECTED 300 each 2  . acetaminophen (TYLENOL) 500 MG tablet Take 1,000-1,500 mg by mouth every 6 (six) hours as needed for mild pain.     . calcium carbonate (TUMS) 500 MG chewable tablet Chew 2 tablets by mouth 3 (three) times daily as needed for indigestion. Takes on Monday, wednesdays, and fridays    . cetirizine (ZYRTEC) 10 MG tablet Take 10 mg by mouth daily.    . diazepam (VALIUM) 5 MG tablet Take 1 tablet (5 mg total) by mouth every 6 (six) hours as needed for anxiety (spasms). 6 tablet 0  . famotidine (PEPCID) 20 MG tablet Take 1 tablet (20 mg total) by mouth daily. (Patient taking differently: Take 20 mg by mouth daily. For heartburn and acid reflux) 30 tablet 0  . fluticasone (FLONASE) 50 MCG/ACT nasal spray Place 2 sprays into both nostrils daily. 9.9 g 2  . glucose blood (BAYER CONTOUR NEXT TEST) test strip Check 4 times per day. DX CODE E11.9 300 each 12  . glucose blood (ONETOUCH VERIO) test strip Check 4 times per day. DX CODE E11.9 200 each 5  . guaiFENesin (MUCINEX) 600 MG 12 hr tablet Take 2 tablets (1,200 mg total) by mouth  2 (two) times daily. 20 tablet 0  . hydrOXYzine (ATARAX) 25 MG tablet Take 25 mg by mouth 2 (two) times daily as needed for itching.     Marland Kitchen lisinopril-hydrochlorothiazide (PRINZIDE,ZESTORETIC) 20-25 MG tablet Take 1 tablet by mouth daily as needed (blood pressure).   5  . loperamide (IMODIUM) 2 MG capsule Take 2 mg by mouth as needed for diarrhea or loose stools.     . mycophenolate (MYFORTIC) 360 MG TBEC Take 720 mg by mouth 2 (two) times daily.     . pantoprazole (PROTONIX) 20 MG tablet Take 1 tablet (20 mg total) by mouth daily. 30 tablet 0  . predniSONE (DELTASONE) 5 MG tablet Take 5 mg by mouth at bedtime.     . promethazine (PHENERGAN) 12.5 MG tablet Take 1 tablet (12.5 mg total) by mouth every 6 (six) hours as needed for nausea or vomiting. 30  tablet 0  . simvastatin (ZOCOR) 20 MG tablet Take 20 mg by mouth 3 (three) times a week. MWF.    Marland Kitchen tacrolimus (PROGRAF) 1 MG capsule Take 2 mg by mouth 2 (two) times daily.      No current facility-administered medications on file prior to visit.     Allergies  Allergen Reactions  . Benadryl [Diphenhydramine-Zinc Acetate] Shortness Of Breath  . Motrin [Ibuprofen] Shortness Of Breath and Itching    Per pt  . Banana Other (See Comments)    Sick on the stomach  . Diphenhydramine Hcl     REACTION: Stopped breathing in ICU  . Doxycycline     Shortness of Breath   . Iron Dextran     REACTION: vein irritation  . Shellfish Allergy Hives    Family History  Problem Relation Age of Onset  . Arthritis Mother   . Diabetes Mother   . Hypertension Mother     BP 130/80 (BP Location: Left Arm, Patient Position: Sitting, Cuff Size: Normal)   Pulse 92   Wt 183 lb 6.4 oz (83.2 kg)   SpO2 98%   BMI 29.60 kg/m    Review of Systems She denies hypoglycemia.     Objective:   Physical Exam VITAL SIGNS:  See vs page GENERAL: no distress Pulses: dorsalis pedis intact bilat.   MSK: no deformity of the feet CV: no leg edema Skin:  no ulcer on the feet.  normal color and temp on the feet.  Neuro: sensation is intact to touch on the feet.   Ext: There is bilateral onychomycosis of the toenails.    A1c=12.3%    Assessment & Plan:  Type 1 DM, with renal transplant: worse Noncompliance with cbg recording and insulin, persistent. Patient is advised the following: Patient Instructions  Please continue the tresiba, 120 units daily.  If you miss a shot, take it as soon as you remember.  You should take this no matter what your blood sugar is.   Your a1c is really high, so it is critically important to take the full amount.  On this type of insulin schedule, you should eat meals on a regular schedule.  If a meal is missed or significantly delayed, your blood sugar could go low.   Please come  back for a follow-up appointment in 3 months.   check your blood sugar 4 times a day: before the 3 meals, and at bedtime.  also check if you have symptoms of your blood sugar being too high or too low.  please keep a record of the readings and bring  it to your next appointment here.  You can write it on any piece of paper.  please call us sooner if your blood sugar goes below 70, or if you have a lot of readings over 200.

## 2016-04-29 ENCOUNTER — Inpatient Hospital Stay (HOSPITAL_COMMUNITY)
Admission: RE | Admit: 2016-04-29 | Discharge: 2016-04-29 | Disposition: A | Discharge status: Home / Self Care | Payer: Medicare Other | Source: Ambulatory Visit | Attending: Nephrology | Admitting: Nephrology

## 2016-04-29 NOTE — Progress Notes (Signed)
Pt is now 70 late for her appointment.  We talked to her last week about being on time for her appointment.  We explained to her that if she was more than 30 minutes late for her appointment we would not be able to see her that day.  If she shows up we will reschedule her for another day

## 2016-05-03 ENCOUNTER — Encounter (HOSPITAL_COMMUNITY)
Admission: RE | Admit: 2016-05-03 | Discharge: 2016-05-03 | Disposition: A | Discharge status: Home / Self Care | Payer: Medicare Other | Source: Ambulatory Visit | Attending: Nephrology | Admitting: Nephrology

## 2016-05-03 DIAGNOSIS — N183 Chronic kidney disease, stage 3 (moderate): Secondary | ICD-10-CM | POA: Diagnosis not present

## 2016-05-03 DIAGNOSIS — D631 Anemia in chronic kidney disease: Secondary | ICD-10-CM | POA: Diagnosis not present

## 2016-05-03 DIAGNOSIS — N182 Chronic kidney disease, stage 2 (mild): Secondary | ICD-10-CM

## 2016-05-03 LAB — IRON AND TIBC
Iron: 75 ug/dL (ref 28–170)
Saturation Ratios: 31 % (ref 10.4–31.8)
TIBC: 239 ug/dL — AB (ref 250–450)
UIBC: 164 ug/dL

## 2016-05-03 LAB — POCT HEMOGLOBIN-HEMACUE: Hemoglobin: 11.1 g/dL — ABNORMAL LOW (ref 12.0–15.0)

## 2016-05-03 LAB — FERRITIN: FERRITIN: 992 ng/mL — AB (ref 11–307)

## 2016-05-03 MED ORDER — EPOETIN ALFA 10000 UNIT/ML IJ SOLN
INTRAMUSCULAR | Status: AC
  Administered 2016-05-03: 10000 [IU] via SUBCUTANEOUS
  Filled 2016-05-03: qty 1

## 2016-05-03 MED ORDER — EPOETIN ALFA 20000 UNIT/ML IJ SOLN
INTRAMUSCULAR | Status: AC
  Administered 2016-05-03: 20000 [IU] via SUBCUTANEOUS
  Filled 2016-05-03: qty 1

## 2016-05-03 MED ORDER — EPOETIN ALFA 40000 UNIT/ML IJ SOLN: 30000.0000 [IU] | INTRAMUSCULAR | Status: DC

## 2016-05-13 DIAGNOSIS — D649 Anemia, unspecified: Secondary | ICD-10-CM | POA: Diagnosis not present

## 2016-05-13 DIAGNOSIS — Z9889 Other specified postprocedural states: Secondary | ICD-10-CM | POA: Diagnosis not present

## 2016-05-13 DIAGNOSIS — E139 Other specified diabetes mellitus without complications: Secondary | ICD-10-CM | POA: Diagnosis not present

## 2016-05-13 DIAGNOSIS — L299 Pruritus, unspecified: Secondary | ICD-10-CM | POA: Diagnosis not present

## 2016-05-13 DIAGNOSIS — I10 Essential (primary) hypertension: Secondary | ICD-10-CM | POA: Diagnosis not present

## 2016-05-13 DIAGNOSIS — Z940 Kidney transplant status: Secondary | ICD-10-CM | POA: Diagnosis not present

## 2016-05-13 DIAGNOSIS — E785 Hyperlipidemia, unspecified: Secondary | ICD-10-CM | POA: Diagnosis not present

## 2016-05-14 DIAGNOSIS — Z940 Kidney transplant status: Secondary | ICD-10-CM | POA: Diagnosis not present

## 2016-05-14 DIAGNOSIS — D631 Anemia in chronic kidney disease: Secondary | ICD-10-CM | POA: Diagnosis not present

## 2016-05-20 ENCOUNTER — Encounter (HOSPITAL_COMMUNITY)
Admission: RE | Admit: 2016-05-20 | Discharge: 2016-05-20 | Disposition: A | Discharge status: Home / Self Care | Payer: Medicare Other | Source: Ambulatory Visit | Attending: Nephrology | Admitting: Nephrology

## 2016-05-20 DIAGNOSIS — N182 Chronic kidney disease, stage 2 (mild): Secondary | ICD-10-CM

## 2016-05-20 DIAGNOSIS — D631 Anemia in chronic kidney disease: Secondary | ICD-10-CM | POA: Diagnosis not present

## 2016-05-20 DIAGNOSIS — N183 Chronic kidney disease, stage 3 (moderate): Secondary | ICD-10-CM | POA: Diagnosis not present

## 2016-05-20 LAB — POCT HEMOGLOBIN-HEMACUE: Hemoglobin: 10.8 g/dL — ABNORMAL LOW (ref 12.0–15.0)

## 2016-05-20 MED ORDER — EPOETIN ALFA 20000 UNIT/ML IJ SOLN
INTRAMUSCULAR | Status: AC
  Administered 2016-05-20: 20000 [IU] via SUBCUTANEOUS
  Filled 2016-05-20: qty 1

## 2016-05-20 MED ORDER — EPOETIN ALFA 40000 UNIT/ML IJ SOLN: 30000.0000 [IU] | INTRAMUSCULAR | Status: DC

## 2016-05-20 MED ORDER — EPOETIN ALFA 10000 UNIT/ML IJ SOLN
INTRAMUSCULAR | Status: AC
  Administered 2016-05-20: 10000 [IU] via SUBCUTANEOUS
  Filled 2016-05-20: qty 1

## 2016-06-03 ENCOUNTER — Encounter (HOSPITAL_COMMUNITY)
Admission: RE | Admit: 2016-06-03 | Discharge: 2016-06-03 | Disposition: A | Discharge status: Home / Self Care | Payer: Medicare Other | Source: Ambulatory Visit | Attending: Nephrology | Admitting: Nephrology

## 2016-06-03 DIAGNOSIS — N183 Chronic kidney disease, stage 3 (moderate): Secondary | ICD-10-CM | POA: Diagnosis not present

## 2016-06-03 DIAGNOSIS — D631 Anemia in chronic kidney disease: Secondary | ICD-10-CM | POA: Diagnosis not present

## 2016-06-03 DIAGNOSIS — N182 Chronic kidney disease, stage 2 (mild): Secondary | ICD-10-CM

## 2016-06-03 LAB — IRON AND TIBC
IRON: 47 ug/dL (ref 28–170)
SATURATION RATIOS: 21 % (ref 10.4–31.8)
TIBC: 227 ug/dL — AB (ref 250–450)
UIBC: 180 ug/dL

## 2016-06-03 LAB — POCT HEMOGLOBIN-HEMACUE: Hemoglobin: 10.3 g/dL — ABNORMAL LOW (ref 12.0–15.0)

## 2016-06-03 LAB — FERRITIN: Ferritin: 694 ng/mL — ABNORMAL HIGH (ref 11–307)

## 2016-06-03 MED ORDER — EPOETIN ALFA 10000 UNIT/ML IJ SOLN
INTRAMUSCULAR | Status: AC
  Administered 2016-06-03: 10000 [IU] via SUBCUTANEOUS
  Filled 2016-06-03: qty 1

## 2016-06-03 MED ORDER — EPOETIN ALFA 40000 UNIT/ML IJ SOLN: 30000.0000 [IU] | INTRAMUSCULAR | Status: DC

## 2016-06-03 MED ORDER — EPOETIN ALFA 20000 UNIT/ML IJ SOLN
INTRAMUSCULAR | Status: AC
  Administered 2016-06-03: 20000 [IU] via SUBCUTANEOUS
  Filled 2016-06-03: qty 1

## 2016-06-16 ENCOUNTER — Emergency Department (HOSPITAL_COMMUNITY)
Admission: EM | Admit: 2016-06-16 | Discharge: 2016-06-16 | Disposition: A | Discharge status: Home / Self Care | Payer: No Typology Code available for payment source | Attending: Emergency Medicine | Admitting: Emergency Medicine

## 2016-06-16 ENCOUNTER — Emergency Department (HOSPITAL_COMMUNITY): Payer: No Typology Code available for payment source

## 2016-06-16 ENCOUNTER — Encounter (HOSPITAL_COMMUNITY): Payer: Self-pay

## 2016-06-16 DIAGNOSIS — Y999 Unspecified external cause status: Secondary | ICD-10-CM | POA: Insufficient documentation

## 2016-06-16 DIAGNOSIS — I129 Hypertensive chronic kidney disease with stage 1 through stage 4 chronic kidney disease, or unspecified chronic kidney disease: Secondary | ICD-10-CM | POA: Diagnosis not present

## 2016-06-16 DIAGNOSIS — R1084 Generalized abdominal pain: Secondary | ICD-10-CM | POA: Diagnosis not present

## 2016-06-16 DIAGNOSIS — Y939 Activity, unspecified: Secondary | ICD-10-CM | POA: Diagnosis not present

## 2016-06-16 DIAGNOSIS — Z940 Kidney transplant status: Secondary | ICD-10-CM | POA: Diagnosis not present

## 2016-06-16 DIAGNOSIS — Z794 Long term (current) use of insulin: Secondary | ICD-10-CM | POA: Insufficient documentation

## 2016-06-16 DIAGNOSIS — S299XXA Unspecified injury of thorax, initial encounter: Secondary | ICD-10-CM | POA: Diagnosis not present

## 2016-06-16 DIAGNOSIS — N189 Chronic kidney disease, unspecified: Secondary | ICD-10-CM | POA: Diagnosis not present

## 2016-06-16 DIAGNOSIS — Y92410 Unspecified street and highway as the place of occurrence of the external cause: Secondary | ICD-10-CM | POA: Diagnosis not present

## 2016-06-16 DIAGNOSIS — Z79899 Other long term (current) drug therapy: Secondary | ICD-10-CM | POA: Diagnosis not present

## 2016-06-16 DIAGNOSIS — S161XXA Strain of muscle, fascia and tendon at neck level, initial encounter: Secondary | ICD-10-CM | POA: Diagnosis not present

## 2016-06-16 DIAGNOSIS — S39012A Strain of muscle, fascia and tendon of lower back, initial encounter: Secondary | ICD-10-CM | POA: Insufficient documentation

## 2016-06-16 DIAGNOSIS — E1122 Type 2 diabetes mellitus with diabetic chronic kidney disease: Secondary | ICD-10-CM | POA: Insufficient documentation

## 2016-06-16 DIAGNOSIS — S199XXA Unspecified injury of neck, initial encounter: Secondary | ICD-10-CM | POA: Diagnosis present

## 2016-06-16 DIAGNOSIS — S279XXA Injury of unspecified intrathoracic organ, initial encounter: Secondary | ICD-10-CM | POA: Diagnosis not present

## 2016-06-16 LAB — URINALYSIS, ROUTINE W REFLEX MICROSCOPIC
Bilirubin Urine: NEGATIVE
Glucose, UA: 150 mg/dL — AB
Hgb urine dipstick: NEGATIVE
KETONES UR: NEGATIVE mg/dL
Leukocytes, UA: NEGATIVE
Nitrite: NEGATIVE
PROTEIN: 30 mg/dL — AB
Specific Gravity, Urine: 1.014 (ref 1.005–1.030)
pH: 5 (ref 5.0–8.0)

## 2016-06-16 LAB — CBC WITH DIFFERENTIAL/PLATELET
Basophils Absolute: 0 10*3/uL (ref 0.0–0.1)
Basophils Relative: 0 %
EOS ABS: 0.2 10*3/uL (ref 0.0–0.7)
Eosinophils Relative: 3 %
HEMATOCRIT: 32.9 % — AB (ref 36.0–46.0)
HEMOGLOBIN: 10.5 g/dL — AB (ref 12.0–15.0)
LYMPHS ABS: 2 10*3/uL (ref 0.7–4.0)
LYMPHS PCT: 26 %
MCH: 27.6 pg (ref 26.0–34.0)
MCHC: 31.9 g/dL (ref 30.0–36.0)
MCV: 86.6 fL (ref 78.0–100.0)
MONOS PCT: 6 %
Monocytes Absolute: 0.5 10*3/uL (ref 0.1–1.0)
NEUTROS ABS: 5.1 10*3/uL (ref 1.7–7.7)
NEUTROS PCT: 65 %
Platelets: 228 10*3/uL (ref 150–400)
RBC: 3.8 MIL/uL — ABNORMAL LOW (ref 3.87–5.11)
RDW: 14.4 % (ref 11.5–15.5)
WBC: 7.8 10*3/uL (ref 4.0–10.5)

## 2016-06-16 LAB — BASIC METABOLIC PANEL
Anion gap: 7 (ref 5–15)
BUN: 22 mg/dL — AB (ref 6–20)
CHLORIDE: 108 mmol/L (ref 101–111)
CO2: 23 mmol/L (ref 22–32)
CREATININE: 1.5 mg/dL — AB (ref 0.44–1.00)
Calcium: 8.4 mg/dL — ABNORMAL LOW (ref 8.9–10.3)
GFR calc Af Amer: 52 mL/min — ABNORMAL LOW (ref >60)
GFR calc non Af Amer: 45 mL/min — ABNORMAL LOW (ref >60)
GLUCOSE: 266 mg/dL — AB (ref 65–99)
POTASSIUM: 4 mmol/L (ref 3.5–5.1)
Sodium: 138 mmol/L (ref 135–145)

## 2016-06-16 MED ORDER — OXYCODONE HCL 5 MG PO TABS
10.0000 mg | ORAL_TABLET | Freq: Once | ORAL | Status: AC
  Administered 2016-06-16: 10 mg via ORAL
  Filled 2016-06-16: qty 2

## 2016-06-16 MED ORDER — HYDROCODONE-ACETAMINOPHEN 5-325 MG PO TABS: 1.0000 | ORAL_TABLET | Freq: Four times a day (QID) | ORAL | 0 refills | Status: DC | PRN

## 2016-06-16 MED ORDER — CYCLOBENZAPRINE HCL 10 MG PO TABS: 10.0000 mg | ORAL_TABLET | Freq: Two times a day (BID) | ORAL | 0 refills | Status: DC | PRN

## 2016-06-16 MED ORDER — ONDANSETRON 4 MG PO TBDP
8.0000 mg | ORAL_TABLET | Freq: Once | ORAL | Status: AC
  Administered 2016-06-16: 8 mg via ORAL
  Filled 2016-06-16: qty 2
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
## 2016-06-16 NOTE — ED Notes (Signed)
Pt. Given water with EDP approval.  
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
## 2016-06-16 NOTE — Discharge Instructions (Signed)
Your blood work and urine don't show any evidence of injury.  If your abdominal pain worsens, please return to the ER.  We discussed getting a CT scan in there ER, and you have elected to forego this test now, but you need to return if your symptoms worsen or if you urinate blood.

## 2016-06-16 NOTE — ED Notes (Signed)
Patient transported to X-ray 

## 2016-06-17 ENCOUNTER — Inpatient Hospital Stay (HOSPITAL_COMMUNITY)
Admission: RE | Admit: 2016-06-17 | Discharge: 2016-06-17 | Disposition: A | Discharge status: Home / Self Care | Payer: Medicare Other | Source: Ambulatory Visit | Attending: Nephrology | Admitting: Nephrology
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
## 2016-06-21 ENCOUNTER — Encounter (HOSPITAL_COMMUNITY)
Admission: RE | Admit: 2016-06-21 | Discharge: 2016-06-21 | Disposition: A | Discharge status: Home / Self Care | Payer: Medicare Other | Source: Ambulatory Visit | Attending: Nephrology | Admitting: Nephrology

## 2016-06-21 DIAGNOSIS — N182 Chronic kidney disease, stage 2 (mild): Secondary | ICD-10-CM

## 2016-06-21 DIAGNOSIS — N183 Chronic kidney disease, stage 3 (moderate): Secondary | ICD-10-CM | POA: Insufficient documentation

## 2016-06-21 DIAGNOSIS — D631 Anemia in chronic kidney disease: Secondary | ICD-10-CM | POA: Insufficient documentation

## 2016-06-21 LAB — POCT HEMOGLOBIN-HEMACUE: Hemoglobin: 10.4 g/dL — ABNORMAL LOW (ref 12.0–15.0)

## 2016-06-21 MED ORDER — EPOETIN ALFA 40000 UNIT/ML IJ SOLN: 30000.0000 [IU] | INTRAMUSCULAR | Status: DC

## 2016-06-21 MED ORDER — EPOETIN ALFA 20000 UNIT/ML IJ SOLN
INTRAMUSCULAR | Status: AC
  Administered 2016-06-21: 20000 [IU] via SUBCUTANEOUS
  Filled 2016-06-21: qty 1

## 2016-06-21 MED ORDER — EPOETIN ALFA 10000 UNIT/ML IJ SOLN
INTRAMUSCULAR | Status: AC
  Administered 2016-06-21: 10000 [IU] via SUBCUTANEOUS
  Filled 2016-06-21: qty 1

## 2016-06-28 ENCOUNTER — Encounter (HOSPITAL_COMMUNITY): Payer: Self-pay | Admitting: *Deleted

## 2016-06-28 ENCOUNTER — Emergency Department (HOSPITAL_COMMUNITY)
Admission: EM | Admit: 2016-06-28 | Discharge: 2016-06-28 | Disposition: A | Discharge status: Home / Self Care | Payer: No Typology Code available for payment source | Attending: Emergency Medicine | Admitting: Emergency Medicine

## 2016-06-28 DIAGNOSIS — S098XXA Other specified injuries of head, initial encounter: Secondary | ICD-10-CM | POA: Diagnosis not present

## 2016-06-28 DIAGNOSIS — Y92410 Unspecified street and highway as the place of occurrence of the external cause: Secondary | ICD-10-CM | POA: Diagnosis not present

## 2016-06-28 DIAGNOSIS — Z794 Long term (current) use of insulin: Secondary | ICD-10-CM | POA: Diagnosis not present

## 2016-06-28 DIAGNOSIS — M25511 Pain in right shoulder: Secondary | ICD-10-CM | POA: Insufficient documentation

## 2016-06-28 DIAGNOSIS — N189 Chronic kidney disease, unspecified: Secondary | ICD-10-CM | POA: Insufficient documentation

## 2016-06-28 DIAGNOSIS — M791 Myalgia: Secondary | ICD-10-CM | POA: Diagnosis not present

## 2016-06-28 DIAGNOSIS — M546 Pain in thoracic spine: Secondary | ICD-10-CM | POA: Diagnosis not present

## 2016-06-28 DIAGNOSIS — Z79899 Other long term (current) drug therapy: Secondary | ICD-10-CM | POA: Insufficient documentation

## 2016-06-28 DIAGNOSIS — I129 Hypertensive chronic kidney disease with stage 1 through stage 4 chronic kidney disease, or unspecified chronic kidney disease: Secondary | ICD-10-CM | POA: Insufficient documentation

## 2016-06-28 DIAGNOSIS — Y999 Unspecified external cause status: Secondary | ICD-10-CM | POA: Diagnosis not present

## 2016-06-28 DIAGNOSIS — Y939 Activity, unspecified: Secondary | ICD-10-CM | POA: Insufficient documentation

## 2016-06-28 DIAGNOSIS — E119 Type 2 diabetes mellitus without complications: Secondary | ICD-10-CM | POA: Insufficient documentation

## 2016-06-28 DIAGNOSIS — S199XXA Unspecified injury of neck, initial encounter: Secondary | ICD-10-CM | POA: Diagnosis present

## 2016-06-28 DIAGNOSIS — M542 Cervicalgia: Secondary | ICD-10-CM | POA: Insufficient documentation

## 2016-06-28 DIAGNOSIS — M7918 Myalgia, other site: Secondary | ICD-10-CM

## 2016-06-28 DIAGNOSIS — G4489 Other headache syndrome: Secondary | ICD-10-CM | POA: Diagnosis not present

## 2016-06-28 MED ORDER — HYDROCODONE-ACETAMINOPHEN 5-325 MG PO TABS: 1.0000 | ORAL_TABLET | ORAL | 0 refills | Status: DC | PRN

## 2016-06-28 MED ORDER — CYCLOBENZAPRINE HCL 10 MG PO TABS: 10.0000 mg | ORAL_TABLET | Freq: Two times a day (BID) | ORAL | 0 refills | Status: AC | PRN

## 2016-06-28 NOTE — ED Notes (Signed)
CBG was 160 for ems
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
## 2016-06-30 DIAGNOSIS — K219 Gastro-esophageal reflux disease without esophagitis: Secondary | ICD-10-CM | POA: Diagnosis not present

## 2016-06-30 DIAGNOSIS — E0842 Diabetes mellitus due to underlying condition with diabetic polyneuropathy: Secondary | ICD-10-CM | POA: Diagnosis not present

## 2016-06-30 DIAGNOSIS — I10 Essential (primary) hypertension: Secondary | ICD-10-CM | POA: Diagnosis not present

## 2016-06-30 DIAGNOSIS — M25519 Pain in unspecified shoulder: Secondary | ICD-10-CM | POA: Diagnosis not present

## 2016-06-30 DIAGNOSIS — E1121 Type 2 diabetes mellitus with diabetic nephropathy: Secondary | ICD-10-CM | POA: Diagnosis not present

## 2016-07-08 ENCOUNTER — Encounter (HOSPITAL_COMMUNITY)
Admission: RE | Admit: 2016-07-08 | Discharge: 2016-07-08 | Disposition: A | Discharge status: Home / Self Care | Payer: Medicare Other | Source: Ambulatory Visit | Attending: Nephrology | Admitting: Nephrology

## 2016-07-08 DIAGNOSIS — N183 Chronic kidney disease, stage 3 (moderate): Secondary | ICD-10-CM | POA: Diagnosis not present

## 2016-07-08 DIAGNOSIS — N182 Chronic kidney disease, stage 2 (mild): Secondary | ICD-10-CM

## 2016-07-08 DIAGNOSIS — D631 Anemia in chronic kidney disease: Secondary | ICD-10-CM | POA: Diagnosis not present

## 2016-07-08 LAB — IRON AND TIBC
Iron: 108 ug/dL (ref 28–170)
SATURATION RATIOS: 45 % — AB (ref 10.4–31.8)
TIBC: 239 ug/dL — AB (ref 250–450)
UIBC: 131 ug/dL

## 2016-07-08 LAB — POCT HEMOGLOBIN-HEMACUE: HEMOGLOBIN: 10.6 g/dL — AB (ref 12.0–15.0)

## 2016-07-08 LAB — FERRITIN: FERRITIN: 917 ng/mL — AB (ref 11–307)

## 2016-07-08 MED ORDER — EPOETIN ALFA 20000 UNIT/ML IJ SOLN
INTRAMUSCULAR | Status: AC
  Administered 2016-07-08: 20000 [IU] via SUBCUTANEOUS
  Filled 2016-07-08: qty 1

## 2016-07-08 MED ORDER — EPOETIN ALFA 40000 UNIT/ML IJ SOLN: 30000.0000 [IU] | INTRAMUSCULAR | Status: DC

## 2016-07-08 MED ORDER — EPOETIN ALFA 10000 UNIT/ML IJ SOLN
INTRAMUSCULAR | Status: AC
  Administered 2016-07-08: 10000 [IU] via SUBCUTANEOUS
  Filled 2016-07-08: qty 1

## 2016-07-14 DIAGNOSIS — E1121 Type 2 diabetes mellitus with diabetic nephropathy: Secondary | ICD-10-CM | POA: Diagnosis not present

## 2016-07-14 DIAGNOSIS — S335XXA Sprain of ligaments of lumbar spine, initial encounter: Secondary | ICD-10-CM | POA: Diagnosis not present

## 2016-07-14 DIAGNOSIS — I10 Essential (primary) hypertension: Secondary | ICD-10-CM | POA: Diagnosis not present

## 2016-07-16 ENCOUNTER — Encounter (HOSPITAL_COMMUNITY): Payer: Self-pay | Admitting: *Deleted

## 2016-07-16 ENCOUNTER — Emergency Department (HOSPITAL_COMMUNITY)
Admission: EM | Admit: 2016-07-16 | Discharge: 2016-07-16 | Disposition: A | Discharge status: Home / Self Care | Payer: Medicare Other | Attending: Emergency Medicine | Admitting: Emergency Medicine

## 2016-07-16 ENCOUNTER — Emergency Department (HOSPITAL_COMMUNITY): Payer: Medicare Other

## 2016-07-16 DIAGNOSIS — Z79899 Other long term (current) drug therapy: Secondary | ICD-10-CM | POA: Insufficient documentation

## 2016-07-16 DIAGNOSIS — Z940 Kidney transplant status: Secondary | ICD-10-CM | POA: Diagnosis not present

## 2016-07-16 DIAGNOSIS — R1031 Right lower quadrant pain: Secondary | ICD-10-CM | POA: Diagnosis not present

## 2016-07-16 DIAGNOSIS — E1122 Type 2 diabetes mellitus with diabetic chronic kidney disease: Secondary | ICD-10-CM | POA: Insufficient documentation

## 2016-07-16 DIAGNOSIS — I129 Hypertensive chronic kidney disease with stage 1 through stage 4 chronic kidney disease, or unspecified chronic kidney disease: Secondary | ICD-10-CM | POA: Diagnosis not present

## 2016-07-16 DIAGNOSIS — Z794 Long term (current) use of insulin: Secondary | ICD-10-CM | POA: Diagnosis not present

## 2016-07-16 DIAGNOSIS — N182 Chronic kidney disease, stage 2 (mild): Secondary | ICD-10-CM | POA: Diagnosis not present

## 2016-07-16 LAB — URINALYSIS, ROUTINE W REFLEX MICROSCOPIC
Bilirubin Urine: NEGATIVE
Glucose, UA: 150 mg/dL — AB
Hgb urine dipstick: NEGATIVE
KETONES UR: NEGATIVE mg/dL
LEUKOCYTES UA: NEGATIVE
NITRITE: NEGATIVE
PROTEIN: NEGATIVE mg/dL
Specific Gravity, Urine: 1.009 (ref 1.005–1.030)
pH: 5 (ref 5.0–8.0)

## 2016-07-16 LAB — COMPREHENSIVE METABOLIC PANEL
ALT: 12 U/L — ABNORMAL LOW (ref 14–54)
AST: 20 U/L (ref 15–41)
Albumin: 3.5 g/dL (ref 3.5–5.0)
Alkaline Phosphatase: 82 U/L (ref 38–126)
Anion gap: 10 (ref 5–15)
BILIRUBIN TOTAL: 0.5 mg/dL (ref 0.3–1.2)
BUN: 33 mg/dL — AB (ref 6–20)
CO2: 19 mmol/L — ABNORMAL LOW (ref 22–32)
CREATININE: 1.67 mg/dL — AB (ref 0.44–1.00)
Calcium: 7.8 mg/dL — ABNORMAL LOW (ref 8.9–10.3)
Chloride: 102 mmol/L (ref 101–111)
GFR, EST AFRICAN AMERICAN: 46 mL/min — AB (ref >60)
GFR, EST NON AFRICAN AMERICAN: 40 mL/min — AB (ref >60)
Glucose, Bld: 310 mg/dL — ABNORMAL HIGH (ref 65–99)
POTASSIUM: 4.3 mmol/L (ref 3.5–5.1)
Sodium: 131 mmol/L — ABNORMAL LOW (ref 135–145)
TOTAL PROTEIN: 6.3 g/dL — AB (ref 6.5–8.1)

## 2016-07-16 LAB — CBC
HCT: 29.8 % — ABNORMAL LOW (ref 36.0–46.0)
Hemoglobin: 9.8 g/dL — ABNORMAL LOW (ref 12.0–15.0)
MCH: 28.3 pg (ref 26.0–34.0)
MCHC: 32.9 g/dL (ref 30.0–36.0)
MCV: 86.1 fL (ref 78.0–100.0)
PLATELETS: 219 10*3/uL (ref 150–400)
RBC: 3.46 MIL/uL — AB (ref 3.87–5.11)
RDW: 14.6 % (ref 11.5–15.5)
WBC: 9.8 10*3/uL (ref 4.0–10.5)

## 2016-07-16 LAB — POC URINE PREG, ED: Preg Test, Ur: NEGATIVE

## 2016-07-16 MED ORDER — SODIUM CHLORIDE 0.9 % IV BOLUS (SEPSIS)
1000.0000 mL | Freq: Once | INTRAVENOUS | Status: AC
  Administered 2016-07-16: 1000 mL via INTRAVENOUS

## 2016-07-16 NOTE — ED Notes (Signed)
Patient returned back to room placed on monitor.

## 2016-07-16 NOTE — ED Provider Notes (Signed)
[name] DEPT Provider Note   CSN: [id number] Arrival date & time: 07/16/16  6720     History   Chief Complaint Chief Complaint  Patient presents with  . Abdominal Pain    HPI [name] is a 31 y.o. female with history of kidney transplant in 2007, chronic kidney disease, diabetes who presents with a 2 day history of right lower quadrant pain. Patient describes her pain as intermittent and sharp. She has had some radiation to her right flank. Patient states it is around where her kidney transplant is. Patient has had associated decreased urination and some loose stools. She denies any blood in her stools. Patient has also had some associated nausea, but no vomiting. Patient occasionally takes Tylenol for her pain, but no other medications at home out of her normal regimen. Patient denies any fevers, chest pain, shortness of breath, Abnormal vaginal bleeding or discharge. Patient reports never having a menstrual cycle; patient has history of XXX syndrome and premature ovarian failure. Patient has also had some associated lower extremity swelling.  HPI  Past Medical History:  Diagnosis Date  . Chronic kidney disease   . Diabetes mellitus    Pt reports diagnosis in June 2011  . Hyperlipidemia   . Hypertension   . Kidney transplant recipient   . UTI (urinary tract infection) 01/09/2015    Patient Active Problem List   Diagnosis Date Noted  . Diabetes ([place]) 05/25/2015  . Acute UTI   . Emesis   . UTI (urinary tract infection) 01/09/2015  . Abdominal pain 01/09/2015  . Nausea and vomiting 01/09/2015  . Acute on chronic kidney failure ([place]) 01/09/2015  . UTI (lower urinary tract infection) 01/09/2015  . Malnutrition of moderate degree 01/06/2015  . Renal failure (ARF), acute on chronic (HCC) 01/05/2015  . Fever, unspecified   . Renal transplant recipient   . Immunosuppressed status ([place])   . Hyponatremia   . Acute renal failure ([place]) 09/30/2014  . Sepsis ([place])  06/24/2014  . Pyelonephritis 06/23/2014  . Dehydration 12/22/2012  . Chest pain 12/22/2012  . 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 CHRONIC KIDNEY DISEASE STAGE II (MILD) 01/04/2010  . OVARIAN FAILURE, PREMATURE 03/11/2009  . 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 SECONDARY HYPERPARATHYROIDISM 12/05/2007  . OBESITY 09/25/2007  . Anemia in chronic renal disease 09/25/2007  . LEARNING DISABILITY 09/25/2007  . Essential hypertension, benign 09/25/2007  . KIDNEY TRANSPLANTATION, HX OF 09/25/2007    Past Surgical History:  Procedure Laterality Date  . KIDNEY TRANSPLANT  2007  . RENAL BIOPSY Bilateral 2003  . THYROID LOBECTOMY      OB History    No data available       Home Medications    Prior to Admission medications   Medication Sig Start Date End Date Taking? Authorizing Provider  acetaminophen (TYLENOL) 500 MG tablet Take 1,000-1,500 mg by mouth every 6 (six) hours as needed for mild pain.    Yes Historical Provider, MD  calcium carbonate (TUMS) 500 MG chewable tablet Chew 2 tablets by mouth 3 (three) times daily as needed for indigestion. Takes on Monday, wednesdays, and fridays   Yes Historical Provider, MD  cetirizine (ZYRTEC) 10 MG tablet Take 10 mg by mouth daily.   Yes Historical Provider, MD  diazepam (VALIUM) 5 MG tablet Take 1 tablet (5 mg total) by mouth every 6 (six) hours as needed for anxiety (spasms). 05/11/15  Yes Elnora Morrison, MD  famotidine (PEPCID) 20 MG  tablet Take 1 tablet (20 mg total) by mouth daily. Patient taking differently: Take 20 mg by mouth See admin instructions. M W F 01/12/15  Yes Nita Sells, MD  fluticasone (FLONASE) 50 MCG/ACT nasal spray Place 2 sprays into both nostrils daily. 07/20/15  Yes Hannah Muthersbaugh, PA-C  HYDROcodone-acetaminophen (NORCO/VICODIN) 5-325 MG tablet Take 1-2 tablets by mouth every 4 (four) hours as needed for severe pain. 06/28/16  Yes Lorin Glass, PA-C    hydrOXYzine (ATARAX) 25 MG tablet Take 25 mg by mouth 2 (two) times daily as needed for itching.    Yes Historical Provider, MD  insulin degludec (TRESIBA FLEXTOUCH) 100 UNIT/ML SOPN FlexTouch Pen Inject 1.2 mLs (120 Units total) into the skin daily. And pen needles 1/day 04/22/16  Yes Renato Shin, MD  lisinopril-hydrochlorothiazide (PRINZIDE,ZESTORETIC) 20-25 MG tablet Take 1 tablet by mouth daily as needed (blood pressure).  11/14/14  Yes Historical Provider, MD  mycophenolate (MYFORTIC) 360 MG TBEC Take 720 mg by mouth 2 (two) times daily.    Yes Historical Provider, MD  pantoprazole (PROTONIX) 20 MG tablet Take 1 tablet (20 mg total) by mouth daily. 01/12/15  Yes Nita Sells, MD  predniSONE (DELTASONE) 5 MG tablet Take 5 mg by mouth at bedtime.    Yes Historical Provider, MD  promethazine (PHENERGAN) 12.5 MG tablet Take 1 tablet (12.5 mg total) by mouth every 6 (six) hours as needed for nausea or vomiting. 10/01/14  Yes Ripudeep Krystal Eaton, MD  simvastatin (ZOCOR) 20 MG tablet Take 20 mg by mouth 3 (three) times a week. MWF.   Yes Historical Provider, MD  tacrolimus (PROGRAF) 1 MG capsule Take 2 mg by mouth 2 (two) times daily.    Yes Historical Provider, MD  ACCU-CHEK SOFTCLIX LANCETS lancets Use to check blood sugar 4 times per day. 12/29/15   Renato Shin, MD  ACCU-CHEK SOFTCLIX LANCETS lancets USE TO CHECK BLOOD SUGAR FOUR TIMES DAILY AS DIRECTED 01/01/16   Renato Shin, MD  glucose blood (BAYER CONTOUR NEXT TEST) test strip Check 4 times per day. DX CODE E11.9 06/13/15   Renato Shin, MD  glucose blood (ONETOUCH VERIO) test strip Check 4 times per day. DX CODE E11.9 12/19/15   Renato Shin, MD  guaiFENesin (MUCINEX) 600 MG 12 hr tablet Take 2 tablets (1,200 mg total) by mouth 2 (two) times daily. Patient not taking: Reported on 06/16/2016 07/20/15   Jarrett Soho Muthersbaugh, PA-C    Family History Family History  Problem Relation Age of Onset  . Arthritis Mother   . Diabetes Mother   .  Hypertension Mother     Social History Social History  Substance Use Topics  . Smoking status: Never Smoker  . Smokeless tobacco: Never Used  . Alcohol use No     Allergies   Benadryl [diphenhydramine-zinc acetate]; Motrin [ibuprofen]; Banana; Diphenhydramine hcl; Doxycycline; Iron dextran; and Shellfish allergy   Review of Systems Review of Systems  Constitutional: Negative for chills and fever.  HENT: Negative for facial swelling and sore throat.   Respiratory: Negative for shortness of breath.   Cardiovascular: Negative for chest pain.  Gastrointestinal: Positive for abdominal pain (RLQ), diarrhea and nausea. Negative for blood in stool and vomiting.  Genitourinary: Positive for decreased urine volume and flank pain (R). Negative for dysuria.  Musculoskeletal: Negative for back pain.  Skin: Negative for rash and wound.  Neurological: Negative for headaches.  Psychiatric/Behavioral: The patient is not nervous/anxious.      Physical Exam Updated Vital Signs BP (!) 147/86  Pulse 95   Temp 98.1 F (36.7 C)   Resp 16   SpO2 94%   Physical Exam  Constitutional: She appears well-developed and well-nourished. No distress.  HENT:  Head: Normocephalic and atraumatic.  Mouth/Throat: Oropharynx is clear and moist. No oropharyngeal exudate.  Eyes: Conjunctivae are normal. Pupils are equal, round, and reactive to light. Right eye exhibits no discharge. Left eye exhibits no discharge. No scleral icterus.  Neck: Normal range of motion. Neck supple. No thyromegaly present.  Cardiovascular: Normal rate, regular rhythm, normal heart sounds and intact distal pulses.  Exam reveals no gallop and no friction rub.   No murmur heard. Pulmonary/Chest: Effort normal and breath sounds normal. No stridor. No respiratory distress. She has no wheezes. She has no rales.  Abdominal: Soft. Bowel sounds are normal. She exhibits no distension. There is tenderness in the right upper quadrant and  right lower quadrant. There is no rebound, no guarding and no CVA tenderness.    Musculoskeletal: She exhibits no edema.  Lymphadenopathy:    She has no cervical adenopathy.  Neurological: She is alert. Coordination normal.  Skin: Skin is warm and dry. No rash noted. She is not diaphoretic. No pallor.  Psychiatric: She has a normal mood and affect.  Nursing note and vitals reviewed.    ED Treatments / Results  Labs (all labs ordered are listed, but only abnormal results are displayed) Labs Reviewed  COMPREHENSIVE METABOLIC PANEL - Abnormal; Notable for the following:       Result Value   Sodium 131 (*)    CO2 19 (*)    Glucose, Bld 310 (*)    BUN 33 (*)    Creatinine, Ser 1.67 (*)    Calcium 7.8 (*)    Total Protein 6.3 (*)    ALT 12 (*)    GFR calc non Af Amer 40 (*)    GFR calc Af Amer 46 (*)    All other components within normal limits  CBC - Abnormal; Notable for the following:    RBC 3.46 (*)    Hemoglobin 9.8 (*)    HCT 29.8 (*)    All other components within normal limits  URINALYSIS, ROUTINE W REFLEX MICROSCOPIC - Abnormal; Notable for the following:    Color, Urine STRAW (*)    Glucose, UA 150 (*)    All other components within normal limits  POC URINE PREG, ED    EKG  EKG Interpretation None       Radiology Ct Abdomen Pelvis Wo Contrast  Result Date: 07/16/2016 CLINICAL DATA:  Right lower quadrant pain for 2 days EXAM: CT ABDOMEN AND PELVIS WITHOUT CONTRAST TECHNIQUE: Multidetector CT imaging of the abdomen and pelvis was performed following the standard protocol without IV contrast. Oral contrast was administered. COMPARISON:  June 23, 2014 FINDINGS: Lower chest: Lung bases are clear. Hepatobiliary: No focal liver lesions are evident on this noncontrast enhanced study. Gallbladder wall is not appreciably thickened. Gallbladder does appear somewhat contracted. There is no biliary duct dilatation. Pancreas: No pancreatic mass or inflammatory focus. Spleen:  No splenic lesions evident. Adrenals/Urinary Tract: Adrenals appear normal bilaterally. There is no native kidney on the right. No lesions seen in the right pararenal fossa. Native left kidney is markedly atrophic. No mass or hydronephrosis involving the native left kidney. No calculus. There is a transplant kidney in the right pelvis. There is a cyst arising in the upper pole of the transplant kidney measuring 1.7 x 1.0 cm. There is a  cystic area in the periphery of the midportion of the transplant kidney measuring 2.7 x 2.0 cm. There is moderate hydronephrosis of the right transplant kidney. No perinephric fluid. No calculus is seen in the right transplant kidney or ureter. Urinary bladder is midline with wall thickness within normal limits. Stomach/Bowel: There is no appreciable bowel wall or mesenteric thickening. There is no evident bowel obstruction. No free air or portal venous air. Vascular/Lymphatic: There is no abdominal aortic aneurysm. No vascular lesion evident. No adenopathy appreciable in the abdomen or pelvis. Reproductive: Uterus is either surgically absent or markedly atrophic. No mass is seen in the adnexal regions. Other: Appendix region appears unremarkable. No ascites or abscess evident in the abdomen or pelvis. Musculoskeletal: There are no blastic or lytic bone lesions. No intramuscular or abdominal wall lesions. IMPRESSION: Transplant kidney in right pelvis. There is moderate hydronephrosis and ureterectasis involving the transplant kidney without calculus seen. Question pyelonephritis given this appearance. Urinary bladder wall not thickened. 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 Marked atrophy of native left kidney. Uterus either markedly atrophic or absent. No pelvic mass apart from the transplant kidney. No bowel obstruction or abscess.  No appendiceal lesion. Electronically Signed   By: Lowella Grip III M.D.   On: 07/16/2016 10:30    Procedures Procedures  (including critical care time)  Medications Ordered in ED Medications  sodium chloride 0.9 % bolus 1,000 mL (0 mLs Intravenous Stopped 07/16/16 1100)     Initial Impression / Assessment and Plan / ED Course  I have reviewed the triage vital signs and the nursing notes.  Pertinent labs & imaging results that were available during my care of the patient were reviewed by me and considered in my medical decision making (see chart for details).     Patient with probable chronic changes to kidney transplant. CBC shows hemoglobin 9.8, patient has chronic anemia. CMP shows sodium 131, CO2 19, glucose 310, BUN 33, creatinine 1.67, calcium 7.8, protein 6.3. Kidney function is stable from the past. UA is negative. Urine pregnancy negative. CT abdomen and pelvis without contrast shows moderate hydronephrosis and ureterectasis involving the transplant kidney without calculus. Pyelonephritis was questioned by radiologist, however patient's urinalysis normal. Suspect chronic changes. Patient to follow-up with transplant doctor as soon as possible. However, no emergent changes at this time as patient's kidney function is stable and patient with stable vitals. Strict return precautions discussed. Patient understands and agrees with plan. Patient vitals stable throughout ED course and discharged in satisfactory condition. Patient also evaluated by Dr. Vanita Panda who guided the patient management and agrees with plan.  Final Clinical Impressions(s) / ED Diagnoses   Final diagnoses:  RLQ abdominal pain  History of kidney transplant    New Prescriptions New Prescriptions   No medications on file     Frederica Kuster, PA-C 07/16/16 1106    Carmin Muskrat, MD 07/20/16 [phone]

## 2016-07-16 NOTE — Discharge Instructions (Signed)
Please follow-up with your transplant doctor as soon as possible for further evaluation and treatment of your symptoms. You can take Tylenol as prescribed over-the-counter as needed for your pain. Do not take more than 4000 mg of Tylenol in a 24-hour period. Please return to emergency department if you develop any new or worsening symptoms.
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
## 2016-07-16 NOTE — ED Notes (Signed)
Patient ambulated to the restroom without assistance 

## 2016-07-16 NOTE — ED Notes (Signed)
ED Provider at bedside. 

## 2016-07-16 NOTE — ED Notes (Signed)
Pt ambulated to restroom, tolerated well.

## 2016-07-21 ENCOUNTER — Other Ambulatory Visit: Payer: Self-pay | Admitting: Nephrology

## 2016-07-21 ENCOUNTER — Other Ambulatory Visit (HOSPITAL_COMMUNITY): Payer: Self-pay | Admitting: *Deleted

## 2016-07-21 DIAGNOSIS — N1330 Unspecified hydronephrosis: Secondary | ICD-10-CM

## 2016-07-21 DIAGNOSIS — T8619 Other complication of kidney transplant: Principal | ICD-10-CM

## 2016-07-22 ENCOUNTER — Encounter: Payer: Self-pay | Admitting: Endocrinology

## 2016-07-22 ENCOUNTER — Encounter (HOSPITAL_COMMUNITY)
Admission: RE | Admit: 2016-07-22 | Discharge: 2016-07-22 | Disposition: A | Discharge status: Home / Self Care | Payer: Medicare Other | Source: Ambulatory Visit | Attending: Nephrology | Admitting: Nephrology

## 2016-07-22 ENCOUNTER — Ambulatory Visit (INDEPENDENT_AMBULATORY_CARE_PROVIDER_SITE_OTHER): Payer: Medicare Other | Admitting: Endocrinology

## 2016-07-22 VITALS — BP 116/78 | HR 90 | Ht 66.0 in | Wt 184.0 lb

## 2016-07-22 DIAGNOSIS — N185 Chronic kidney disease, stage 5: Secondary | ICD-10-CM

## 2016-07-22 DIAGNOSIS — E1022 Type 1 diabetes mellitus with diabetic chronic kidney disease: Secondary | ICD-10-CM

## 2016-07-22 DIAGNOSIS — N183 Chronic kidney disease, stage 3 (moderate): Secondary | ICD-10-CM | POA: Diagnosis not present

## 2016-07-22 DIAGNOSIS — N182 Chronic kidney disease, stage 2 (mild): Secondary | ICD-10-CM

## 2016-07-22 DIAGNOSIS — D631 Anemia in chronic kidney disease: Secondary | ICD-10-CM | POA: Insufficient documentation

## 2016-07-22 LAB — POCT GLYCOSYLATED HEMOGLOBIN (HGB A1C): Hemoglobin A1C: 11.8

## 2016-07-22 LAB — POCT HEMOGLOBIN-HEMACUE: Hemoglobin: 9.8 g/dL — ABNORMAL LOW (ref 12.0–15.0)

## 2016-07-22 MED ORDER — INSULIN DEGLUDEC 100 UNIT/ML ~~LOC~~ SOPN: 130.0000 [IU] | PEN_INJECTOR | Freq: Every day | SUBCUTANEOUS | 11 refills | Status: DC

## 2016-07-22 MED ORDER — EPOETIN ALFA 20000 UNIT/ML IJ SOLN
INTRAMUSCULAR | Status: AC
  Administered 2016-07-22: 20000 [IU]
  Filled 2016-07-22: qty 1

## 2016-07-22 MED ORDER — EPOETIN ALFA 40000 UNIT/ML IJ SOLN: 30000.0000 [IU] | INTRAMUSCULAR | Status: DC

## 2016-07-22 MED ORDER — EPOETIN ALFA 10000 UNIT/ML IJ SOLN
INTRAMUSCULAR | Status: AC
  Administered 2016-07-22: 10000 [IU]
  Filled 2016-07-22: qty 1
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
## 2016-07-22 NOTE — Patient Instructions (Addendum)
Please increase the tresiba to 130 units daily.  If you miss a shot, take it as soon as you remember.  You should take this no matter what your blood sugar is.   On this type of insulin schedule, you should eat meals on a regular schedule.  If a meal is missed or significantly delayed, your blood sugar could go low.   Please come back for a follow-up appointment in 3 months.   check your blood sugar 4 times a day: before the 3 meals, and at bedtime.  also check if you have symptoms of your blood sugar being too high or too low.  please keep a record of the readings and bring it to your next appointment here.  You can write it on any piece of paper.  please call us sooner if your blood sugar goes below 70, or if you have a lot of readings over 200.

## 2016-07-26 ENCOUNTER — Ambulatory Visit
Admission: RE | Admit: 2016-07-26 | Discharge: 2016-07-26 | Disposition: A | Discharge status: Home / Self Care | Payer: Medicare Other | Source: Ambulatory Visit | Attending: Nephrology | Admitting: Nephrology

## 2016-07-26 DIAGNOSIS — N1330 Unspecified hydronephrosis: Secondary | ICD-10-CM

## 2016-07-26 DIAGNOSIS — T8619 Other complication of kidney transplant: Principal | ICD-10-CM

## 2016-07-27 ENCOUNTER — Telehealth: Payer: Self-pay | Admitting: Endocrinology

## 2016-07-27 NOTE — Telephone Encounter (Signed)
Patient dismissed from [facility] by Renato Shin MD , effective Jul 22, 2016. Dismissal letter sent out by certified / registered mail.  daj

## 2016-07-29 DIAGNOSIS — I129 Hypertensive chronic kidney disease with stage 1 through stage 4 chronic kidney disease, or unspecified chronic kidney disease: Secondary | ICD-10-CM | POA: Diagnosis not present

## 2016-07-29 DIAGNOSIS — Z794 Long term (current) use of insulin: Secondary | ICD-10-CM | POA: Insufficient documentation

## 2016-07-29 DIAGNOSIS — Y92481 Parking lot as the place of occurrence of the external cause: Secondary | ICD-10-CM | POA: Insufficient documentation

## 2016-07-29 DIAGNOSIS — E1122 Type 2 diabetes mellitus with diabetic chronic kidney disease: Secondary | ICD-10-CM | POA: Insufficient documentation

## 2016-07-29 DIAGNOSIS — Y999 Unspecified external cause status: Secondary | ICD-10-CM | POA: Diagnosis not present

## 2016-07-29 DIAGNOSIS — S161XXA Strain of muscle, fascia and tendon at neck level, initial encounter: Secondary | ICD-10-CM | POA: Diagnosis not present

## 2016-07-29 DIAGNOSIS — N189 Chronic kidney disease, unspecified: Secondary | ICD-10-CM | POA: Insufficient documentation

## 2016-07-29 DIAGNOSIS — Y939 Activity, unspecified: Secondary | ICD-10-CM | POA: Insufficient documentation

## 2016-07-29 DIAGNOSIS — N1339 Other hydronephrosis: Secondary | ICD-10-CM | POA: Diagnosis not present

## 2016-07-29 DIAGNOSIS — Z79899 Other long term (current) drug therapy: Secondary | ICD-10-CM | POA: Diagnosis not present

## 2016-07-29 DIAGNOSIS — S199XXA Unspecified injury of neck, initial encounter: Secondary | ICD-10-CM | POA: Diagnosis present

## 2016-07-30 ENCOUNTER — Emergency Department (HOSPITAL_COMMUNITY)
Admission: EM | Admit: 2016-07-30 | Discharge: 2016-07-30 | Disposition: A | Discharge status: Home / Self Care | Payer: Medicare Other | Attending: Emergency Medicine | Admitting: Emergency Medicine

## 2016-07-30 ENCOUNTER — Encounter (HOSPITAL_COMMUNITY): Payer: Self-pay

## 2016-07-30 DIAGNOSIS — S161XXA Strain of muscle, fascia and tendon at neck level, initial encounter: Secondary | ICD-10-CM

## 2016-07-30 MED ORDER — CYCLOBENZAPRINE HCL 10 MG PO TABS: 10.0000 mg | ORAL_TABLET | Freq: Two times a day (BID) | ORAL | 0 refills | Status: DC | PRN

## 2016-07-30 MED ORDER — HYDROCODONE-ACETAMINOPHEN 5-325 MG PO TABS: 1.0000 | ORAL_TABLET | Freq: Four times a day (QID) | ORAL | 0 refills | Status: DC | PRN

## 2016-07-30 NOTE — ED Notes (Signed)
Pt understood dc material. NAD noted. Scripts given at dc 
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
## 2016-08-03 NOTE — Telephone Encounter (Signed)
Received signed domestic return receipt verifying delivery of certified letter on Jul 30, 2016. Article number [id number] [id number] daj

## 2016-08-05 ENCOUNTER — Encounter (HOSPITAL_COMMUNITY)
Admission: RE | Admit: 2016-08-05 | Discharge: 2016-08-05 | Disposition: A | Discharge status: Home / Self Care | Payer: Medicare Other | Source: Ambulatory Visit | Attending: Nephrology | Admitting: Nephrology

## 2016-08-05 DIAGNOSIS — N182 Chronic kidney disease, stage 2 (mild): Secondary | ICD-10-CM

## 2016-08-05 DIAGNOSIS — N183 Chronic kidney disease, stage 3 (moderate): Secondary | ICD-10-CM | POA: Diagnosis not present

## 2016-08-05 LAB — IRON AND TIBC
IRON: 38 ug/dL (ref 28–170)
Saturation Ratios: 18 % (ref 10.4–31.8)
TIBC: 206 ug/dL — AB (ref 250–450)
UIBC: 168 ug/dL

## 2016-08-05 LAB — POCT HEMOGLOBIN-HEMACUE: Hemoglobin: 10.1 g/dL — ABNORMAL LOW (ref 12.0–15.0)

## 2016-08-05 LAB — FERRITIN: FERRITIN: 789 ng/mL — AB (ref 11–307)

## 2016-08-05 MED ORDER — EPOETIN ALFA 40000 UNIT/ML IJ SOLN: 30000.0000 [IU] | INTRAMUSCULAR | Status: DC

## 2016-08-05 MED ORDER — EPOETIN ALFA 10000 UNIT/ML IJ SOLN
INTRAMUSCULAR | Status: AC
  Administered 2016-08-05: 10000 [IU] via SUBCUTANEOUS
  Filled 2016-08-05: qty 1

## 2016-08-05 MED ORDER — EPOETIN ALFA 20000 UNIT/ML IJ SOLN
INTRAMUSCULAR | Status: AC
  Administered 2016-08-05: 20000 [IU] via SUBCUTANEOUS
  Filled 2016-08-05: qty 1

## 2016-08-10 ENCOUNTER — Emergency Department (HOSPITAL_COMMUNITY): Payer: Medicare Other

## 2016-08-10 ENCOUNTER — Emergency Department (HOSPITAL_COMMUNITY)
Admission: EM | Admit: 2016-08-10 | Discharge: 2016-08-11 | Disposition: A | Discharge status: Home / Self Care | Payer: Medicare Other | Attending: Emergency Medicine | Admitting: Emergency Medicine

## 2016-08-10 ENCOUNTER — Encounter (HOSPITAL_COMMUNITY): Payer: Self-pay

## 2016-08-10 DIAGNOSIS — E1122 Type 2 diabetes mellitus with diabetic chronic kidney disease: Secondary | ICD-10-CM | POA: Diagnosis not present

## 2016-08-10 DIAGNOSIS — Z794 Long term (current) use of insulin: Secondary | ICD-10-CM | POA: Diagnosis not present

## 2016-08-10 DIAGNOSIS — Z79899 Other long term (current) drug therapy: Secondary | ICD-10-CM | POA: Diagnosis not present

## 2016-08-10 DIAGNOSIS — I129 Hypertensive chronic kidney disease with stage 1 through stage 4 chronic kidney disease, or unspecified chronic kidney disease: Secondary | ICD-10-CM | POA: Insufficient documentation

## 2016-08-10 DIAGNOSIS — R197 Diarrhea, unspecified: Secondary | ICD-10-CM | POA: Insufficient documentation

## 2016-08-10 DIAGNOSIS — Z940 Kidney transplant status: Secondary | ICD-10-CM | POA: Diagnosis not present

## 2016-08-10 DIAGNOSIS — R112 Nausea with vomiting, unspecified: Secondary | ICD-10-CM | POA: Diagnosis not present

## 2016-08-10 DIAGNOSIS — N182 Chronic kidney disease, stage 2 (mild): Secondary | ICD-10-CM | POA: Diagnosis not present

## 2016-08-10 LAB — COMPREHENSIVE METABOLIC PANEL
ALT: 14 U/L (ref 14–54)
ANION GAP: 11 (ref 5–15)
AST: 17 U/L (ref 15–41)
Albumin: 3.7 g/dL (ref 3.5–5.0)
Alkaline Phosphatase: 89 U/L (ref 38–126)
BUN: 24 mg/dL — ABNORMAL HIGH (ref 6–20)
CALCIUM: 8 mg/dL — AB (ref 8.9–10.3)
CHLORIDE: 101 mmol/L (ref 101–111)
CO2: 25 mmol/L (ref 22–32)
Creatinine, Ser: 1.4 mg/dL — ABNORMAL HIGH (ref 0.44–1.00)
GFR calc Af Amer: 57 mL/min — ABNORMAL LOW (ref >60)
GFR calc non Af Amer: 49 mL/min — ABNORMAL LOW (ref >60)
Glucose, Bld: 311 mg/dL — ABNORMAL HIGH (ref 65–99)
POTASSIUM: 3.9 mmol/L (ref 3.5–5.1)
Sodium: 137 mmol/L (ref 135–145)
Total Bilirubin: 0.5 mg/dL (ref 0.3–1.2)
Total Protein: 7 g/dL (ref 6.5–8.1)

## 2016-08-10 LAB — CBC
HEMATOCRIT: 34.2 % — AB (ref 36.0–46.0)
HEMOGLOBIN: 10.9 g/dL — AB (ref 12.0–15.0)
MCH: 27.6 pg (ref 26.0–34.0)
MCHC: 31.9 g/dL (ref 30.0–36.0)
MCV: 86.6 fL (ref 78.0–100.0)
Platelets: 286 10*3/uL (ref 150–400)
RBC: 3.95 MIL/uL (ref 3.87–5.11)
RDW: 14 % (ref 11.5–15.5)
WBC: 10.1 10*3/uL (ref 4.0–10.5)

## 2016-08-10 LAB — URINALYSIS, ROUTINE W REFLEX MICROSCOPIC
Bacteria, UA: NONE SEEN
Bilirubin Urine: NEGATIVE
Glucose, UA: >=500 mg/dL — AB
Hgb urine dipstick: NEGATIVE
KETONES UR: NEGATIVE mg/dL
Leukocytes, UA: NEGATIVE
Nitrite: NEGATIVE
PROTEIN: 30 mg/dL — AB
RBC / HPF: NONE SEEN RBC/hpf (ref 0–5)
Specific Gravity, Urine: 1.014 (ref 1.005–1.030)
pH: 5 (ref 5.0–8.0)

## 2016-08-10 LAB — I-STAT BETA HCG BLOOD, ED (MC, WL, AP ONLY): I-stat hCG, quantitative: 5.6 m[IU]/mL — ABNORMAL HIGH (ref <5)

## 2016-08-10 LAB — I-STAT CG4 LACTIC ACID, ED: LACTIC ACID, VENOUS: 1.14 mmol/L (ref 0.5–1.9)

## 2016-08-10 LAB — LIPASE, BLOOD: LIPASE: 26 U/L (ref 11–51)

## 2016-08-10 MED ORDER — ONDANSETRON 4 MG PO TBDP
ORAL_TABLET | ORAL | Status: AC
  Filled 2016-08-10: qty 1

## 2016-08-10 MED ORDER — PROCHLORPERAZINE EDISYLATE 5 MG/ML IJ SOLN
10.0000 mg | Freq: Once | INTRAMUSCULAR | Status: AC
  Administered 2016-08-10: 10 mg via INTRAVENOUS
  Filled 2016-08-10: qty 2

## 2016-08-10 MED ORDER — ONDANSETRON HCL 4 MG PO TABS: 4.0000 mg | ORAL_TABLET | Freq: Three times a day (TID) | ORAL | 0 refills | Status: DC | PRN

## 2016-08-10 MED ORDER — SODIUM CHLORIDE 0.9 % IV BOLUS (SEPSIS)
1000.0000 mL | Freq: Once | INTRAVENOUS | Status: AC
  Administered 2016-08-10: 1000 mL via INTRAVENOUS

## 2016-08-10 MED ORDER — FENTANYL CITRATE (PF) 100 MCG/2ML IJ SOLN
50.0000 ug | Freq: Once | INTRAMUSCULAR | Status: AC
  Administered 2016-08-10: 50 ug via INTRAVENOUS
  Filled 2016-08-10: qty 2

## 2016-08-10 MED ORDER — ONDANSETRON 4 MG PO TBDP
4.0000 mg | ORAL_TABLET | Freq: Once | ORAL | Status: AC
  Administered 2016-08-10: 4 mg via ORAL

## 2016-08-10 MED ORDER — ONDANSETRON HCL 4 MG/2ML IJ SOLN
4.0000 mg | Freq: Once | INTRAMUSCULAR | Status: AC
  Administered 2016-08-10: 4 mg via INTRAVENOUS
  Filled 2016-08-10: qty 2

## 2016-08-10 MED ORDER — METOCLOPRAMIDE HCL 5 MG/ML IJ SOLN
10.0000 mg | Freq: Once | INTRAMUSCULAR | Status: AC
  Administered 2016-08-10: 10 mg via INTRAVENOUS
  Filled 2016-08-10: qty 2

## 2016-08-10 NOTE — ED Provider Notes (Signed)
[facility] DEPT Provider Note   CSN: [id number] Arrival date & time: 08/10/16  1545     History   Chief Complaint Chief Complaint  Patient presents with  . Emesis    HPI [name] is a 31 y.o. female.  The history is provided by the patient.  Emesis   This is a new problem. The current episode started 12 to 24 hours ago. The problem occurs 2 to 4 times per day. The problem has not changed since onset.The emesis has an appearance of stomach contents. There has been no fever. The fever has been present for less than 1 day. Associated symptoms include abdominal pain and diarrhea. Pertinent negatives include no arthralgias, no chills, no cough, no fever, no headaches, no myalgias, no sweats and no URI. Risk factors: Renal transplant patient, unable to hold down fluids. No sick contacts, no suscipicious food intake.    Past Medical History:  Diagnosis Date  . Chronic kidney disease   . Diabetes mellitus    Pt reports diagnosis in June 2011  . Hyperlipidemia   . Hypertension   . Kidney transplant recipient   . UTI (urinary tract infection) 01/09/2015    Patient Active Problem List   Diagnosis Date Noted  . Diabetes ([place]) 05/25/2015  . Acute UTI   . Emesis   . UTI (urinary tract infection) 01/09/2015  . Abdominal pain 01/09/2015  . Nausea and vomiting 01/09/2015  . Acute on chronic kidney failure ([place]) 01/09/2015  . UTI (lower urinary tract infection) 01/09/2015  . Malnutrition of moderate degree 01/06/2015  . Renal failure (ARF), acute on chronic (HCC) 01/05/2015  . Fever, unspecified   . Renal transplant recipient   . Immunosuppressed status ([place])   . Hyponatremia   . Acute renal failure ([place]) 09/30/2014  . Sepsis ([place]) 06/24/2014  . Pyelonephritis 06/23/2014  . Dehydration 12/22/2012  . Chest pain 12/22/2012  . 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 CHRONIC KIDNEY DISEASE STAGE II (MILD)  01/04/2010  . OVARIAN FAILURE, PREMATURE 03/11/2009  . 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 SECONDARY HYPERPARATHYROIDISM 12/05/2007  . OBESITY 09/25/2007  . Anemia in chronic renal disease 09/25/2007  . LEARNING DISABILITY 09/25/2007  . Essential hypertension, benign 09/25/2007  . KIDNEY TRANSPLANTATION, HX OF 09/25/2007    Past Surgical History:  Procedure Laterality Date  . KIDNEY TRANSPLANT  2007  . RENAL BIOPSY Bilateral 2003  . THYROID LOBECTOMY      OB History    No data available       Home Medications    Prior to Admission medications   Medication Sig Start Date End Date Taking? Authorizing Provider  acetaminophen (TYLENOL) 500 MG tablet Take 1,000-1,500 mg by mouth every 6 (six) hours as needed for mild pain.    Yes [provider]  calcium carbonate (TUMS) 500 MG chewable tablet Chew 2 tablets by mouth 3 (three) times daily as needed for indigestion. Takes on Monday, wednesdays, and fridays   Yes [provider]  cetirizine (ZYRTEC) 10 MG tablet Take 10 mg by mouth daily.   Yes [provider]  cyclobenzaprine (FLEXERIL) 10 MG tablet Take 1 tablet (10 mg total) by mouth 2 (two) times daily as needed for muscle spasms. 07/30/16  Yes Montine Circle, PA-C  diazepam (VALIUM) 5 MG tablet Take 1 tablet (5 mg total) by mouth every 6 (six) hours as needed for anxiety (spasms). 05/11/15  Yes Elnora Morrison, MD  famotidine (PEPCID) 20  MG tablet Take 1 tablet (20 mg total) by mouth daily. Patient taking differently: Take 20 mg by mouth See admin instructions. M W F 01/12/15  Yes Nita Sells, MD  fluticasone (FLONASE) 50 MCG/ACT nasal spray Place 2 sprays into both nostrils daily. 07/20/15  Yes Muthersbaugh, Jarrett Soho, PA-C  guaiFENesin (MUCINEX) 600 MG 12 hr tablet Take 2 tablets (1,200 mg total) by mouth 2 (two) times daily. 07/20/15  Yes Muthersbaugh, Jarrett Soho, PA-C  HYDROcodone-acetaminophen (NORCO/VICODIN) 5-325 MG tablet Take 1-2 tablets by mouth every 6  (six) hours as needed. 07/30/16  Yes Montine Circle, PA-C  hydrOXYzine (ATARAX) 25 MG tablet Take 25 mg by mouth 2 (two) times daily as needed for itching.    Yes [provider]  insulin degludec (TRESIBA FLEXTOUCH) 100 UNIT/ML SOPN FlexTouch Pen Inject 1.3 mLs (130 Units total) into the skin daily. And pen needles 1/day 07/22/16  Yes Renato Shin, MD  lisinopril-hydrochlorothiazide (PRINZIDE,ZESTORETIC) 20-25 MG tablet Take 1 tablet by mouth daily as needed (blood pressure).  11/14/14  Yes [provider]  mycophenolate (MYFORTIC) 360 MG TBEC Take 720 mg by mouth 2 (two) times daily.    Yes [provider]  pantoprazole (PROTONIX) 20 MG tablet Take 1 tablet (20 mg total) by mouth daily. 01/12/15  Yes Nita Sells, MD  predniSONE (DELTASONE) 5 MG tablet Take 5 mg by mouth at bedtime.    Yes [provider]  promethazine (PHENERGAN) 12.5 MG tablet Take 1 tablet (12.5 mg total) by mouth every 6 (six) hours as needed for nausea or vomiting. 10/01/14  Yes [name], Ripudeep K, MD  simvastatin (ZOCOR) 20 MG tablet Take 20 mg by mouth 3 (three) times a week. MWF.   Yes [provider]  tacrolimus (PROGRAF) 1 MG capsule Take 2 mg by mouth 2 (two) times daily.    Yes [provider]  ACCU-CHEK SOFTCLIX LANCETS lancets Use to check blood sugar 4 times per day. 12/29/15   Renato Shin, MD  ACCU-CHEK SOFTCLIX LANCETS lancets USE TO CHECK BLOOD SUGAR FOUR TIMES DAILY AS DIRECTED 01/01/16   Renato Shin, MD  glucose blood (BAYER CONTOUR NEXT TEST) test strip Check 4 times per day. DX CODE E11.9 06/13/15   Renato Shin, MD  glucose blood [facility] VERIO) test strip Check 4 times per day. DX CODE E11.9 12/19/15   Renato Shin, MD  ondansetron (ZOFRAN) 4 MG tablet Take 1 tablet (4 mg total) by mouth every 8 (eight) hours as needed for nausea or vomiting. 08/10/16   Lennice Sites, DO    Family History Family History  Problem Relation Age of Onset  .  Arthritis Mother   . Diabetes Mother   . Hypertension Mother     Social History Social History  Substance Use Topics  . Smoking status: Never Smoker  . Smokeless tobacco: Never Used  . Alcohol use No     Allergies   Benadryl [diphenhydramine-zinc acetate]; Motrin [ibuprofen]; Banana; Diphenhydramine hcl; Doxycycline; Iron dextran; and Shellfish allergy   Review of Systems Review of Systems  Constitutional: Negative for chills and fever.  HENT: Negative for ear pain and sore throat.   Eyes: Negative for pain and visual disturbance.  Respiratory: Negative for cough and shortness of breath.   Cardiovascular: Negative for chest pain and palpitations.  Gastrointestinal: Positive for abdominal pain, diarrhea, nausea and vomiting. Negative for abdominal distention, anal bleeding, blood in stool, constipation and rectal pain.  Genitourinary: Negative for dysuria and hematuria.  Musculoskeletal: Negative for arthralgias, back pain  and myalgias.  Skin: Negative for color change and rash.  Neurological: Negative for seizures, syncope and headaches.  All other systems reviewed and are negative.    Physical Exam Updated Vital Signs  ED Triage Vitals  Enc Vitals Group     BP 08/10/16 1555 (!) 129/99     Pulse Rate 08/10/16 1555 98     Resp 08/10/16 1555 20     Temp 08/10/16 1555 98.3 F (36.8 C)     Temp Source 08/10/16 1555 Oral     SpO2 08/10/16 1555 98 %     Weight --      Height --      Head Circumference --      Peak Flow --      Pain Score 08/10/16 1557 8     Pain Loc --      Pain Edu? --      Excl. in [place]? --     Physical Exam  Constitutional: She is oriented to person, place, and time. She appears well-developed and well-nourished. No distress.  HENT:  Head: Normocephalic and atraumatic.  Eyes: Conjunctivae are normal. Pupils are equal, round, and reactive to light.  Neck: Normal range of motion. Neck supple.  Cardiovascular: Normal rate, regular rhythm, normal  heart sounds and intact distal pulses.   No murmur heard. Pulmonary/Chest: Effort normal and breath sounds normal. No respiratory distress.  Abdominal: Soft. Bowel sounds are normal. She exhibits no distension. There is tenderness (diffusely ).  Musculoskeletal: Normal range of motion. She exhibits no edema.  Neurological: She is alert and oriented to person, place, and time. No cranial nerve deficit or sensory deficit. She exhibits normal muscle tone. Coordination normal.  Skin: Skin is warm and dry. Capillary refill takes less than 2 seconds.  Psychiatric: She has a normal mood and affect.  Nursing note and vitals reviewed.    ED Treatments / Results  Labs (all labs ordered are listed, but only abnormal results are displayed) Labs Reviewed  COMPREHENSIVE METABOLIC PANEL - Abnormal; Notable for the following:       Result Value   Glucose, Bld 311 (*)    BUN 24 (*)    Creatinine, Ser 1.40 (*)    Calcium 8.0 (*)    GFR calc non Af Amer 49 (*)    GFR calc Af Amer 57 (*)    All other components within normal limits  CBC - Abnormal; Notable for the following:    Hemoglobin 10.9 (*)    HCT 34.2 (*)    All other components within normal limits  URINALYSIS, ROUTINE W REFLEX MICROSCOPIC - Abnormal; Notable for the following:    Glucose, UA >=500 (*)    Protein, ur 30 (*)    Squamous Epithelial / LPF 0-5 (*)    All other components within normal limits  I-STAT BETA HCG BLOOD, ED ([facility], [facility], [facility] ONLY) - Abnormal; Notable for the following:    I-stat hCG, quantitative 5.6 (*)    All other components within normal limits  LIPASE, BLOOD  I-STAT CG4 LACTIC ACID, ED  I-STAT CG4 LACTIC ACID, ED    EKG  EKG Interpretation None       Radiology Ct Renal Stone Study  Result Date: 08/10/2016 CLINICAL DATA:  LEFT side abdominal pain for 2 days. Kidney transplant 2007. EXAM: CT ABDOMEN AND PELVIS WITHOUT CONTRAST TECHNIQUE: Multidetector CT imaging of the abdomen and pelvis was performed  following the standard protocol without IV contrast. COMPARISON:  07/16/2016  FINDINGS: Lower chest: Lung bases are clear. Hepatobiliary: No focal hepatic lesion. No biliary duct dilatation. Gallbladder is normal. Common bile duct is normal. Pancreas: Pancreas is normal. No ductal dilatation. No pancreatic inflammation. Spleen: Normal spleen Adrenals/urinary tract: Adrenal glands normal. The native LEFT kidney is small. No native RIGHT kidney identified. Transplant kidney in the RIGHT iliac fossa. Kidney is large measuring 15 cm likely representing compensatory hypertrophy. No hydronephrosis. There is simple fluid cysts within the cortex of the kidney. No renal calculi evident. Bladder is grossly normal. No IV contrast administered. Stomach/Bowel: Stomach, small bowel, appendix, and cecum are normal. The colon and rectosigmoid colon are normal. Vascular/Lymphatic: Abdominal aorta is normal caliber. There is no retroperitoneal or periportal lymphadenopathy. No pelvic lymphadenopathy. Reproductive: Post hysterectomy anatomy Other: No free fluid. Musculoskeletal: No aggressive osseous lesion. IMPRESSION: 1. No explanation for LEFT side abdominal pain. 2. Renal transplant in the RIGHT iliac fossa without evidence of complication. Electronically Signed   By: Suzy Bouchard M.D.   On: 08/10/2016 19:42    Procedures Procedures (including critical care time)  Medications Ordered in ED Medications  ondansetron (ZOFRAN-ODT) disintegrating tablet 4 mg (4 mg Oral Given 08/10/16 1751)  sodium chloride 0.9 % bolus 1,000 mL (1,000 mLs Intravenous New Bag/Given 08/10/16 1820)  ondansetron (ZOFRAN) injection 4 mg (4 mg Intravenous Given 08/10/16 1828)  fentaNYL (SUBLIMAZE) injection 50 mcg (50 mcg Intravenous Given 08/10/16 1828)  metoCLOPramide (REGLAN) injection 10 mg (10 mg Intravenous Given 08/10/16 2141)  sodium chloride 0.9 % bolus 1,000 mL (0 mLs Intravenous Stopped 08/10/16 2144)  fentaNYL (SUBLIMAZE) injection 50  mcg (50 mcg Intravenous Given 08/10/16 2140)  prochlorperazine (COMPAZINE) injection 10 mg (10 mg Intravenous Given 08/10/16 2319)     Initial Impression / Assessment and Plan / ED Course  I have reviewed the triage vital signs and the nursing notes.  Pertinent labs & imaging results that were available during my care of the patient were reviewed by me and considered in my medical decision making (see chart for details).     REYLENE STAUDER is a 31 year old female with history of renal failure status post right kidney transplant, diabetes who presents to the ED with abdominal pain, nausea, vomiting, diarrhea. Patient's vitals at time of arrival to the ED are unremarkable and patient is without fever. Patient states that she started to have nausea, vomiting, diarrhea that started today. Patient denies any sick contacts, suspicious food intake, recent travel. Patient denies any trauma. Patient denies any dark stools or bloody stools. Patient states she is passing gas. Patient with no history of GI bleed. Patient with diffuse tenderness on abdominal exam but abdomen is soft. Patient denies hematuria, history of kidney stones. Overall exam unremarkable. We'll get labs and CT abdomen and pelvis. Will get urinalysis. Possibly intra-abdominal process versus early gastroenteritis versus gastroparesis. Pregnancy test unremarkable.  CT abdomen and pelvis with no signs of small bowel obstruction, appendicitis or other intra-abdominal process. No stone. Patient with unremarkable labs with no significant lactic acidosis, electrolyte abnormality, worsening renal failure. U/A unremarkable. Patient likely with viral gastroenteritis versus gastroparesis. Patient given IV normal saline bolus 2, IV Zofran, IV Reglan, IV Compazine. Patient with improvement following medications and by mouth challenge. Patient was given IV fentanyl for pain also with improvement. Patient was offered admission as difficulty continuing to hold  down fluids but she would like to be discharged to home and understands return precautions. Patient discharged from the ED in good condition and told to return to  ED if symptoms worsen.  Final Clinical Impressions(s) / ED Diagnoses   Final diagnoses:  Nausea vomiting and diarrhea    New Prescriptions New Prescriptions   ONDANSETRON (ZOFRAN) 4 MG TABLET    Take 1 tablet (4 mg total) by mouth every 8 (eight) hours as needed for nausea or vomiting.     Lennice Sites, DO 08/10/16 2352    Quintella Reichert, MD 08/11/16 1351

## 2016-08-10 NOTE — ED Notes (Addendum)
Pt tolerating water. States she is feeling better and would like to go home. MD notified

## 2016-08-10 NOTE — ED Triage Notes (Signed)
Pt states she has had abd pain and vomiting X2 days. She has been unable to keep her kidney medications down. She reports she has been unable to keep anything on her stomach.

## 2016-08-10 NOTE — ED Notes (Signed)
Nausea and vomiting continues.

## 2016-08-10 NOTE — ED Notes (Signed)
Pt transporting to CT  

## 2016-08-19 ENCOUNTER — Encounter (HOSPITAL_COMMUNITY)
Admission: RE | Admit: 2016-08-19 | Discharge: 2016-08-19 | Disposition: A | Discharge status: Home / Self Care | Payer: Medicare Other | Source: Ambulatory Visit | Attending: Nephrology | Admitting: Nephrology

## 2016-08-19 DIAGNOSIS — N183 Chronic kidney disease, stage 3 (moderate): Secondary | ICD-10-CM | POA: Diagnosis not present

## 2016-08-19 DIAGNOSIS — D631 Anemia in chronic kidney disease: Secondary | ICD-10-CM | POA: Insufficient documentation

## 2016-08-19 DIAGNOSIS — N182 Chronic kidney disease, stage 2 (mild): Secondary | ICD-10-CM

## 2016-08-19 LAB — POCT HEMOGLOBIN-HEMACUE: Hemoglobin: 10.1 g/dL — ABNORMAL LOW (ref 12.0–15.0)

## 2016-08-19 MED ORDER — EPOETIN ALFA 10000 UNIT/ML IJ SOLN
INTRAMUSCULAR | Status: AC
  Administered 2016-08-19: 10000 [IU]
  Filled 2016-08-19: qty 1

## 2016-08-19 MED ORDER — EPOETIN ALFA 20000 UNIT/ML IJ SOLN
INTRAMUSCULAR | Status: AC
Start: 2016-08-19 — End: 2016-08-19
  Administered 2016-08-19: 20000 [IU]
  Filled 2016-08-19: qty 1

## 2016-08-19 MED ORDER — EPOETIN ALFA 40000 UNIT/ML IJ SOLN: 30000.0000 [IU] | INTRAMUSCULAR | Status: DC

## 2016-09-02 ENCOUNTER — Encounter (HOSPITAL_COMMUNITY)
Admission: RE | Admit: 2016-09-02 | Discharge: 2016-09-02 | Disposition: A | Discharge status: Home / Self Care | Payer: Medicare Other | Source: Ambulatory Visit | Attending: Nephrology | Admitting: Nephrology

## 2016-09-02 DIAGNOSIS — N182 Chronic kidney disease, stage 2 (mild): Secondary | ICD-10-CM

## 2016-09-02 DIAGNOSIS — N183 Chronic kidney disease, stage 3 (moderate): Secondary | ICD-10-CM | POA: Diagnosis not present

## 2016-09-02 LAB — IRON AND TIBC
IRON: 31 ug/dL (ref 28–170)
Saturation Ratios: 14 % (ref 10.4–31.8)
TIBC: 216 ug/dL — ABNORMAL LOW (ref 250–450)
UIBC: 185 ug/dL

## 2016-09-02 LAB — POCT HEMOGLOBIN-HEMACUE: Hemoglobin: 10.1 g/dL — ABNORMAL LOW (ref 12.0–15.0)

## 2016-09-02 LAB — FERRITIN: FERRITIN: 867 ng/mL — AB (ref 11–307)

## 2016-09-02 MED ORDER — EPOETIN ALFA 40000 UNIT/ML IJ SOLN: 30000.0000 [IU] | INTRAMUSCULAR | Status: DC

## 2016-09-02 MED ORDER — EPOETIN ALFA 10000 UNIT/ML IJ SOLN
INTRAMUSCULAR | Status: AC
  Administered 2016-09-02: 10000 [IU] via SUBCUTANEOUS
  Filled 2016-09-02: qty 1

## 2016-09-02 MED ORDER — EPOETIN ALFA 20000 UNIT/ML IJ SOLN
INTRAMUSCULAR | Status: AC
  Administered 2016-09-02: 20000 [IU] via SUBCUTANEOUS
  Filled 2016-09-02: qty 1

## 2016-09-14 ENCOUNTER — Other Ambulatory Visit (HOSPITAL_COMMUNITY): Payer: Self-pay | Admitting: *Deleted

## 2016-09-14 DIAGNOSIS — E785 Hyperlipidemia, unspecified: Secondary | ICD-10-CM | POA: Diagnosis not present

## 2016-09-14 DIAGNOSIS — E139 Other specified diabetes mellitus without complications: Secondary | ICD-10-CM | POA: Diagnosis not present

## 2016-09-14 DIAGNOSIS — D631 Anemia in chronic kidney disease: Secondary | ICD-10-CM | POA: Diagnosis not present

## 2016-09-14 DIAGNOSIS — I10 Essential (primary) hypertension: Secondary | ICD-10-CM | POA: Diagnosis not present

## 2016-09-14 DIAGNOSIS — Z940 Kidney transplant status: Secondary | ICD-10-CM | POA: Diagnosis not present

## 2016-09-14 DIAGNOSIS — Z9889 Other specified postprocedural states: Secondary | ICD-10-CM | POA: Diagnosis not present

## 2016-09-14 DIAGNOSIS — Z6831 Body mass index (BMI) 31.0-31.9, adult: Secondary | ICD-10-CM | POA: Diagnosis not present

## 2016-09-16 ENCOUNTER — Encounter (HOSPITAL_COMMUNITY)
Admission: RE | Admit: 2016-09-16 | Discharge: 2016-09-16 | Disposition: A | Discharge status: Home / Self Care | Payer: Medicare Other | Source: Ambulatory Visit | Attending: Nephrology | Admitting: Nephrology

## 2016-09-16 ENCOUNTER — Encounter (HOSPITAL_COMMUNITY): Payer: Medicare Other

## 2016-09-16 DIAGNOSIS — D631 Anemia in chronic kidney disease: Secondary | ICD-10-CM | POA: Diagnosis not present

## 2016-09-16 DIAGNOSIS — N182 Chronic kidney disease, stage 2 (mild): Secondary | ICD-10-CM

## 2016-09-16 DIAGNOSIS — N183 Chronic kidney disease, stage 3 (moderate): Secondary | ICD-10-CM | POA: Diagnosis not present

## 2016-09-16 LAB — POCT HEMOGLOBIN-HEMACUE: HEMOGLOBIN: 10.4 g/dL — AB (ref 12.0–15.0)

## 2016-09-16 MED ORDER — EPOETIN ALFA 40000 UNIT/ML IJ SOLN: 30000.0000 [IU] | INTRAMUSCULAR | Status: DC

## 2016-09-16 MED ORDER — EPOETIN ALFA 10000 UNIT/ML IJ SOLN
INTRAMUSCULAR | Status: AC
  Administered 2016-09-16: 10000 [IU] via SUBCUTANEOUS
  Filled 2016-09-16: qty 1

## 2016-09-16 MED ORDER — EPOETIN ALFA 20000 UNIT/ML IJ SOLN
INTRAMUSCULAR | Status: AC
  Administered 2016-09-16: 20000 [IU] via SUBCUTANEOUS
  Filled 2016-09-16: qty 1

## 2016-09-16 MED ORDER — SODIUM CHLORIDE 0.9 % IV SOLN
510.0000 mg | Freq: Once | INTRAVENOUS | Status: AC
  Administered 2016-09-16: 510 mg via INTRAVENOUS
  Filled 2016-09-16: qty 17

## 2016-09-16 NOTE — Progress Notes (Signed)
PT demanded to leave early today.  She refused to wait 30 minutes after her Feraheme infusion as is our policy.  She stated that she had to go and take her brother to work.  I explained that we watch the patients 30 minutes after the infusion is complete to monitor her blood pressure and for reactions. She left against my advice.  She only stayed 20 minutes

## 2016-09-17 DIAGNOSIS — Z940 Kidney transplant status: Secondary | ICD-10-CM | POA: Diagnosis not present

## 2016-09-27 DIAGNOSIS — E0842 Diabetes mellitus due to underlying condition with diabetic polyneuropathy: Secondary | ICD-10-CM | POA: Diagnosis not present

## 2016-09-27 DIAGNOSIS — E1121 Type 2 diabetes mellitus with diabetic nephropathy: Secondary | ICD-10-CM | POA: Diagnosis not present

## 2016-09-27 DIAGNOSIS — Z940 Kidney transplant status: Secondary | ICD-10-CM | POA: Diagnosis not present

## 2016-09-27 DIAGNOSIS — I10 Essential (primary) hypertension: Secondary | ICD-10-CM | POA: Diagnosis not present

## 2016-09-27 DIAGNOSIS — E784 Other hyperlipidemia: Secondary | ICD-10-CM | POA: Diagnosis not present

## 2016-09-29 ENCOUNTER — Other Ambulatory Visit (HOSPITAL_COMMUNITY): Payer: Self-pay | Admitting: *Deleted

## 2016-09-30 ENCOUNTER — Emergency Department (HOSPITAL_COMMUNITY)
Admission: EM | Admit: 2016-09-30 | Discharge: 2016-09-30 | Discharge status: Against Medical Advice | Payer: Medicare Other | Source: Home / Self Care

## 2016-09-30 ENCOUNTER — Encounter (HOSPITAL_COMMUNITY)
Admission: RE | Admit: 2016-09-30 | Discharge: 2016-09-30 | Disposition: A | Discharge status: Home / Self Care | Payer: Medicare Other | Source: Ambulatory Visit | Attending: Nephrology | Admitting: Nephrology

## 2016-09-30 ENCOUNTER — Encounter (HOSPITAL_COMMUNITY): Payer: Self-pay | Admitting: Emergency Medicine

## 2016-09-30 DIAGNOSIS — Z79899 Other long term (current) drug therapy: Secondary | ICD-10-CM | POA: Insufficient documentation

## 2016-09-30 DIAGNOSIS — E119 Type 2 diabetes mellitus without complications: Secondary | ICD-10-CM | POA: Diagnosis not present

## 2016-09-30 DIAGNOSIS — N186 End stage renal disease: Secondary | ICD-10-CM | POA: Diagnosis not present

## 2016-09-30 DIAGNOSIS — R1084 Generalized abdominal pain: Secondary | ICD-10-CM

## 2016-09-30 DIAGNOSIS — N183 Chronic kidney disease, stage 3 (moderate): Secondary | ICD-10-CM | POA: Diagnosis not present

## 2016-09-30 DIAGNOSIS — E785 Hyperlipidemia, unspecified: Secondary | ICD-10-CM | POA: Insufficient documentation

## 2016-09-30 DIAGNOSIS — I120 Hypertensive chronic kidney disease with stage 5 chronic kidney disease or end stage renal disease: Secondary | ICD-10-CM | POA: Insufficient documentation

## 2016-09-30 DIAGNOSIS — Z5321 Procedure and treatment not carried out due to patient leaving prior to being seen by health care provider: Secondary | ICD-10-CM

## 2016-09-30 DIAGNOSIS — N182 Chronic kidney disease, stage 2 (mild): Secondary | ICD-10-CM

## 2016-09-30 DIAGNOSIS — R1033 Periumbilical pain: Secondary | ICD-10-CM | POA: Insufficient documentation

## 2016-09-30 DIAGNOSIS — R1030 Lower abdominal pain, unspecified: Secondary | ICD-10-CM | POA: Diagnosis present

## 2016-09-30 DIAGNOSIS — Z940 Kidney transplant status: Secondary | ICD-10-CM | POA: Diagnosis not present

## 2016-09-30 DIAGNOSIS — R112 Nausea with vomiting, unspecified: Secondary | ICD-10-CM | POA: Diagnosis not present

## 2016-09-30 DIAGNOSIS — R109 Unspecified abdominal pain: Secondary | ICD-10-CM | POA: Diagnosis not present

## 2016-09-30 LAB — IRON AND TIBC
IRON: 34 ug/dL (ref 28–170)
Saturation Ratios: 17 % (ref 10.4–31.8)
TIBC: 197 ug/dL — ABNORMAL LOW (ref 250–450)
UIBC: 163 ug/dL

## 2016-09-30 LAB — COMPREHENSIVE METABOLIC PANEL
ALT: 16 U/L (ref 14–54)
ANION GAP: 10 (ref 5–15)
AST: 16 U/L (ref 15–41)
Albumin: 3.8 g/dL (ref 3.5–5.0)
Alkaline Phosphatase: 69 U/L (ref 38–126)
BUN: 29 mg/dL — ABNORMAL HIGH (ref 6–20)
CALCIUM: 8.1 mg/dL — AB (ref 8.9–10.3)
CHLORIDE: 105 mmol/L (ref 101–111)
CO2: 24 mmol/L (ref 22–32)
CREATININE: 1.77 mg/dL — AB (ref 0.44–1.00)
GFR, EST AFRICAN AMERICAN: 43 mL/min — AB (ref >60)
GFR, EST NON AFRICAN AMERICAN: 37 mL/min — AB (ref >60)
Glucose, Bld: 237 mg/dL — ABNORMAL HIGH (ref 65–99)
Potassium: 4.2 mmol/L (ref 3.5–5.1)
SODIUM: 139 mmol/L (ref 135–145)
Total Bilirubin: 0.5 mg/dL (ref 0.3–1.2)
Total Protein: 7.1 g/dL (ref 6.5–8.1)

## 2016-09-30 LAB — CBC
HCT: 34.4 % — ABNORMAL LOW (ref 36.0–46.0)
HEMOGLOBIN: 11.2 g/dL — AB (ref 12.0–15.0)
MCH: 28.4 pg (ref 26.0–34.0)
MCHC: 32.6 g/dL (ref 30.0–36.0)
MCV: 87.1 fL (ref 78.0–100.0)
PLATELETS: 179 10*3/uL (ref 150–400)
RBC: 3.95 MIL/uL (ref 3.87–5.11)
RDW: 14.5 % (ref 11.5–15.5)
WBC: 10.9 10*3/uL — AB (ref 4.0–10.5)

## 2016-09-30 LAB — POCT HEMOGLOBIN-HEMACUE: Hemoglobin: 11.1 g/dL — ABNORMAL LOW (ref 12.0–15.0)

## 2016-09-30 LAB — LIPASE, BLOOD: LIPASE: 18 U/L (ref 11–51)

## 2016-09-30 LAB — FERRITIN: Ferritin: 1115 ng/mL — ABNORMAL HIGH (ref 11–307)

## 2016-09-30 MED ORDER — EPOETIN ALFA 10000 UNIT/ML IJ SOLN
INTRAMUSCULAR | Status: AC
  Administered 2016-09-30: 10000 [IU] via SUBCUTANEOUS
  Filled 2016-09-30: qty 1

## 2016-09-30 MED ORDER — ONDANSETRON HCL 4 MG/2ML IJ SOLN: 4.0000 mg | Freq: Once | INTRAMUSCULAR | Status: DC | PRN

## 2016-09-30 MED ORDER — ONDANSETRON 4 MG PO TBDP
ORAL_TABLET | ORAL | Status: AC
  Filled 2016-09-30: qty 1

## 2016-09-30 MED ORDER — EPOETIN ALFA 40000 UNIT/ML IJ SOLN: 30000.0000 [IU] | INTRAMUSCULAR | Status: DC

## 2016-09-30 MED ORDER — EPOETIN ALFA 20000 UNIT/ML IJ SOLN
INTRAMUSCULAR | Status: AC
  Administered 2016-09-30: 20000 [IU] via SUBCUTANEOUS
  Filled 2016-09-30: qty 1

## 2016-09-30 MED ORDER — ONDANSETRON 4 MG PO TBDP
4.0000 mg | ORAL_TABLET | Freq: Once | ORAL | Status: DC | PRN
Start: 2016-09-30 — End: 2016-10-01

## 2016-09-30 NOTE — ED Triage Notes (Signed)
Pt from home with complaints of generalized abdominal pain that began yesterday and emesis that began today. Pt reports 8 episodes today. Pt states she is unable to further describe her symptoms.

## 2016-09-30 NOTE — ED Notes (Signed)
Pt reports abd pain since this am with emesis. Pt went to [facility] but left d/t wait time. Pt noted to be comfortable in waiting room then as soon as this RN took her into triage she starts screaming out in pain. Pt poor historian and unable to describe exactly what is going on.

## 2016-09-30 NOTE — ED Notes (Signed)
Pt has filled 3 emesis bags while waiting in lobby

## 2016-09-30 NOTE — ED Notes (Signed)
Pt stated she was leaving and going to [facility]

## 2016-10-01 ENCOUNTER — Emergency Department (HOSPITAL_COMMUNITY)
Admission: EM | Admit: 2016-10-01 | Discharge: 2016-10-01 | Disposition: A | Discharge status: Home / Self Care | Payer: Medicare Other | Attending: Emergency Medicine | Admitting: Emergency Medicine

## 2016-10-01 ENCOUNTER — Emergency Department (HOSPITAL_COMMUNITY): Payer: Medicare Other

## 2016-10-01 DIAGNOSIS — R109 Unspecified abdominal pain: Secondary | ICD-10-CM | POA: Diagnosis not present

## 2016-10-01 DIAGNOSIS — R1033 Periumbilical pain: Secondary | ICD-10-CM

## 2016-10-01 DIAGNOSIS — R112 Nausea with vomiting, unspecified: Secondary | ICD-10-CM | POA: Diagnosis not present

## 2016-10-01 LAB — URINALYSIS, ROUTINE W REFLEX MICROSCOPIC
BILIRUBIN URINE: NEGATIVE
Bacteria, UA: NONE SEEN
GLUCOSE, UA: 50 mg/dL — AB
HGB URINE DIPSTICK: NEGATIVE
KETONES UR: NEGATIVE mg/dL
NITRITE: NEGATIVE
PH: 5 (ref 5.0–8.0)
Protein, ur: NEGATIVE mg/dL
Specific Gravity, Urine: 1.014 (ref 1.005–1.030)

## 2016-10-01 LAB — COMPREHENSIVE METABOLIC PANEL
ALK PHOS: 69 U/L (ref 38–126)
ALT: 15 U/L (ref 14–54)
ANION GAP: 9 (ref 5–15)
AST: 17 U/L (ref 15–41)
Albumin: 3.8 g/dL (ref 3.5–5.0)
BUN: 27 mg/dL — ABNORMAL HIGH (ref 6–20)
CALCIUM: 8.2 mg/dL — AB (ref 8.9–10.3)
CO2: 25 mmol/L (ref 22–32)
Chloride: 104 mmol/L (ref 101–111)
Creatinine, Ser: 1.77 mg/dL — ABNORMAL HIGH (ref 0.44–1.00)
GFR calc non Af Amer: 37 mL/min — ABNORMAL LOW (ref >60)
GFR, EST AFRICAN AMERICAN: 43 mL/min — AB (ref >60)
Glucose, Bld: 275 mg/dL — ABNORMAL HIGH (ref 65–99)
Potassium: 4.2 mmol/L (ref 3.5–5.1)
SODIUM: 138 mmol/L (ref 135–145)
TOTAL PROTEIN: 7.1 g/dL (ref 6.5–8.1)
Total Bilirubin: 0.6 mg/dL (ref 0.3–1.2)

## 2016-10-01 LAB — CBC
HCT: 34.9 % — ABNORMAL LOW (ref 36.0–46.0)
HEMOGLOBIN: 11.2 g/dL — AB (ref 12.0–15.0)
MCH: 28 pg (ref 26.0–34.0)
MCHC: 32.1 g/dL (ref 30.0–36.0)
MCV: 87.3 fL (ref 78.0–100.0)
Platelets: 195 10*3/uL (ref 150–400)
RBC: 4 MIL/uL (ref 3.87–5.11)
RDW: 14.8 % (ref 11.5–15.5)
WBC: 8.4 10*3/uL (ref 4.0–10.5)

## 2016-10-01 LAB — LIPASE, BLOOD: Lipase: 24 U/L (ref 11–51)

## 2016-10-01 LAB — PREGNANCY, URINE: Preg Test, Ur: NEGATIVE

## 2016-10-01 MED ORDER — ONDANSETRON HCL 4 MG/2ML IJ SOLN
4.0000 mg | Freq: Once | INTRAMUSCULAR | Status: AC
  Administered 2016-10-01: 4 mg via INTRAVENOUS
  Filled 2016-10-01: qty 2

## 2016-10-01 MED ORDER — MORPHINE SULFATE (PF) 4 MG/ML IV SOLN
4.0000 mg | Freq: Once | INTRAVENOUS | Status: AC
Start: 2016-10-01 — End: 2016-10-01
  Administered 2016-10-01: 4 mg via INTRAVENOUS
  Filled 2016-10-01: qty 1

## 2016-10-01 MED ORDER — SODIUM CHLORIDE 0.9 % IV BOLUS (SEPSIS)
1000.0000 mL | Freq: Once | INTRAVENOUS | Status: AC
  Administered 2016-10-01: 1000 mL via INTRAVENOUS

## 2016-10-01 NOTE — ED Notes (Signed)
Pt departed in NAD, refused use of wheelchair.  

## 2016-10-01 NOTE — ED Notes (Addendum)
Per Radiology Reading Room, CT complete and read, though interpretation not showing at this time. "improved hydronephrosis since 5/4; no bowel obstruction"

## 2016-10-01 NOTE — ED Notes (Signed)
Pt continues to moan and is requesting pain medication.

## 2016-10-01 NOTE — ED Provider Notes (Signed)
[place] DEPT Provider Note   CSN: [id number] Arrival date & time: 09/30/16  2300  By signing my name below, I, Mayer Masker, attest that this documentation has been prepared under the direction and in the presence of Veryl Speak, MD. Electronically Signed: Mayer Masker, Scribe. 10/01/16. 1:32 AM. History   Chief Complaint Chief Complaint  Patient presents with  . Abdominal Pain   The history is provided by the patient. No language interpreter was used.  Abdominal Pain   The current episode started yesterday. The problem occurs constantly. The problem has been gradually worsening. The pain is located in the RLQ and LLQ. Associated symptoms include constipation. Pertinent negatives include diarrhea.    HPI Comments: [name] is a 31 y.o. female with PMHx of kidney disease stage II who presents to the Emergency Department complaining of constant, gradually worsening abdominal pain since yesterday morning. She has associated nausea, vomiting, feeling cold, and constipation. She denies diarrhea. 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 Pt does not have ovaries. Pt has allergies to benadryl and motrin. Pt was at [facility] earlier today but left without being seen due to the wait.  Past Medical History:  Diagnosis Date  . Chronic kidney disease   . Diabetes mellitus    Pt reports diagnosis in June 2011  . Hyperlipidemia   . Hypertension   . Kidney transplant recipient   . UTI (urinary tract infection) 01/09/2015    Patient Active Problem List   Diagnosis Date Noted  . Diabetes ([place]) 05/25/2015  . Acute UTI   . Emesis   . UTI (urinary tract infection) 01/09/2015  . Abdominal pain 01/09/2015  . Nausea and vomiting 01/09/2015  . Acute on chronic kidney failure ([place]) 01/09/2015  . UTI (lower urinary tract infection) 01/09/2015  . Malnutrition of moderate degree 01/06/2015  . Renal failure (ARF), acute on chronic (HCC) 01/05/2015  . Fever, unspecified    . Renal transplant recipient   . Immunosuppressed status ([place])   . Hyponatremia   . Acute renal failure ([place]) 09/30/2014  . Sepsis ([place]) 06/24/2014  . Pyelonephritis 06/23/2014  . Dehydration 12/22/2012  . Chest pain 12/22/2012  . 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 CHRONIC KIDNEY DISEASE STAGE II (MILD) 01/04/2010  . OVARIAN FAILURE, PREMATURE 03/11/2009  . 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 SECONDARY HYPERPARATHYROIDISM 12/05/2007  . OBESITY 09/25/2007  . Anemia in chronic renal disease 09/25/2007  . LEARNING DISABILITY 09/25/2007  . Essential hypertension, benign 09/25/2007  . KIDNEY TRANSPLANTATION, HX OF 09/25/2007   Past Surgical History:  Procedure Laterality Date  . KIDNEY TRANSPLANT  2007  . RENAL BIOPSY Bilateral 2003  . THYROID LOBECTOMY      OB History    No data available       Home Medications    Prior to Admission medications   Medication Sig Start Date End Date Taking? Authorizing Provider  ACCU-CHEK SOFTCLIX LANCETS lancets Use to check blood sugar 4 times per day. 12/29/15   Renato Shin, MD  ACCU-CHEK SOFTCLIX LANCETS lancets USE TO CHECK BLOOD SUGAR FOUR TIMES DAILY AS DIRECTED 01/01/16   Renato Shin, MD  acetaminophen (TYLENOL) 500 MG tablet Take 1,000-1,500 mg by mouth every 6 (six) hours as needed for mild pain.     [provider]  calcium carbonate (TUMS) 500 MG chewable tablet Chew 2 tablets by mouth 3 (three) times daily as needed for indigestion. Takes on Monday, wednesdays, and  fridays    [provider]  cetirizine (ZYRTEC) 10 MG tablet Take 10 mg by mouth daily.    [provider]  cyclobenzaprine (FLEXERIL) 10 MG tablet Take 1 tablet (10 mg total) by mouth 2 (two) times daily as needed for muscle spasms. 07/30/16   Montine Circle, PA-C  diazepam (VALIUM) 5 MG tablet Take 1 tablet (5 mg total) by mouth every 6 (six) hours as needed for anxiety (spasms).  05/11/15   Elnora Morrison, MD  famotidine (PEPCID) 20 MG tablet Take 1 tablet (20 mg total) by mouth daily. Patient taking differently: Take 20 mg by mouth See admin instructions. M W F 01/12/15   Nita Sells, MD  fluticasone (FLONASE) 50 MCG/ACT nasal spray Place 2 sprays into both nostrils daily. 07/20/15   Muthersbaugh, Jarrett Soho, PA-C  glucose blood (BAYER CONTOUR NEXT TEST) test strip Check 4 times per day. DX CODE E11.9 06/13/15   Renato Shin, MD  glucose blood [facility] VERIO) test strip Check 4 times per day. DX CODE E11.9 12/19/15   Renato Shin, MD  guaiFENesin (MUCINEX) 600 MG 12 hr tablet Take 2 tablets (1,200 mg total) by mouth 2 (two) times daily. 07/20/15   Muthersbaugh, Jarrett Soho, PA-C  HYDROcodone-acetaminophen (NORCO/VICODIN) 5-325 MG tablet Take 1-2 tablets by mouth every 6 (six) hours as needed. 07/30/16   Montine Circle, PA-C  hydrOXYzine (ATARAX) 25 MG tablet Take 25 mg by mouth 2 (two) times daily as needed for itching.     [provider]  insulin degludec (TRESIBA FLEXTOUCH) 100 UNIT/ML SOPN FlexTouch Pen Inject 1.3 mLs (130 Units total) into the skin daily. And pen needles 1/day 07/22/16   Renato Shin, MD  lisinopril-hydrochlorothiazide (PRINZIDE,ZESTORETIC) 20-25 MG tablet Take 1 tablet by mouth daily as needed (blood pressure).  11/14/14   [provider]  mycophenolate (MYFORTIC) 360 MG TBEC Take 720 mg by mouth 2 (two) times daily.     [provider]  ondansetron (ZOFRAN) 4 MG tablet Take 1 tablet (4 mg total) by mouth every 8 (eight) hours as needed for nausea or vomiting. 08/10/16   Curatolo, Adam, DO  pantoprazole (PROTONIX) 20 MG tablet Take 1 tablet (20 mg total) by mouth daily. 01/12/15   Nita Sells, MD  predniSONE (DELTASONE) 5 MG tablet Take 5 mg by mouth at bedtime.     [provider]  promethazine (PHENERGAN) 12.5 MG tablet Take 1 tablet (12.5 mg total) by mouth every 6 (six) hours as needed for nausea or vomiting.  10/01/14   [name], Ripudeep K, MD  simvastatin (ZOCOR) 20 MG tablet Take 20 mg by mouth 3 (three) times a week. MWF.    [provider]  tacrolimus (PROGRAF) 1 MG capsule Take 2 mg by mouth 2 (two) times daily.     [provider]    Family History Family History  Problem Relation Age of Onset  . Arthritis Mother   . Diabetes Mother   . Hypertension Mother     Social History Social History  Substance Use Topics  . Smoking status: Never Smoker  . Smokeless tobacco: Never Used  . Alcohol use No     Allergies   Benadryl [diphenhydramine-zinc acetate]; Motrin [ibuprofen]; Banana; Diphenhydramine hcl; Doxycycline; Iron dextran; and Shellfish allergy   Review of Systems Review of Systems  Gastrointestinal: Positive for abdominal pain and constipation. Negative for diarrhea.  All other systems reviewed and are negative.    Physical Exam Updated Vital Signs BP (!) 137/109 (BP Location: Right Arm)  Pulse (!) 104   Temp 98.3 F (36.8 C) (Oral)   Resp 20   Ht 5\' 6"  (1.676 m)   Wt 190 lb (86.2 kg)   SpO2 100%   BMI 30.67 kg/m   Physical Exam  Constitutional: She is oriented to person, place, and time. She appears well-developed and well-nourished. No distress.  31 year old female appears uncomfortable, moaning.  HENT:  Head: Normocephalic and atraumatic.  Mouth/Throat: Oropharynx is clear and moist.  Eyes: EOM are normal.  Neck: Normal range of motion.  Cardiovascular: Normal rate, regular rhythm and normal heart sounds.   Pulmonary/Chest: Effort normal and breath sounds normal.  Abdominal: Soft. Bowel sounds are normal. She exhibits no distension. There is tenderness. There is no rebound and no guarding.  TTP in the periumbilical region   Musculoskeletal: Normal range of motion.  Neurological: She is alert and oriented to person, place, and time.  Skin: Skin is warm and dry.  Psychiatric: She has a normal mood and affect. Judgment normal.  Nursing  note and vitals reviewed.    ED Treatments / Results  DIAGNOSTIC STUDIES: Oxygen Saturation is 100% on RA, normal by my interpretation.    COORDINATION OF CARE: 1:17 AM Discussed treatment plan with pt at bedside and pt agreed to plan.  Labs (all labs ordered are listed, but only abnormal results are displayed) Labs Reviewed  COMPREHENSIVE METABOLIC PANEL - Abnormal; Notable for the following:       Result Value   Glucose, Bld 275 (*)    BUN 27 (*)    Creatinine, Ser 1.77 (*)    Calcium 8.2 (*)    GFR calc non Af Amer 37 (*)    GFR calc Af Amer 43 (*)    All other components within normal limits  CBC - Abnormal; Notable for the following:    Hemoglobin 11.2 (*)    HCT 34.9 (*)    All other components within normal limits  URINALYSIS, ROUTINE W REFLEX MICROSCOPIC - Abnormal; Notable for the following:    APPearance CLOUDY (*)    Glucose, UA 50 (*)    Leukocytes, UA SMALL (*)    Squamous Epithelial / LPF 0-5 (*)    All other components within normal limits  LIPASE, BLOOD    EKG  EKG Interpretation None       Radiology No results found.  Procedures Procedures (including critical care time)  Medications Ordered in ED Medications  ondansetron (ZOFRAN-ODT) disintegrating tablet 4 mg (not administered)  ondansetron (ZOFRAN-ODT) 4 MG disintegrating tablet (not administered)     Initial Impression / Assessment and Plan / ED Course  I have reviewed the triage vital signs and the nursing notes.  Pertinent labs & imaging results that were available during my care of the patient were reviewed by me and considered in my medical decision making (see chart for details).  Patient presents here with complaints of severe abdominal pain that started earlier this evening. Her workup reveals no elevation of white count, essentially unremarkable laboratory studies, and CT scan that shows no evidence for bowel obstruction or obvious abnormality. She is status post renal  transplant and has no evidence for infection.  She will be discharged, to follow-up as needed for any problems.  Final Clinical Impressions(s) / ED Diagnoses   Final diagnoses:  None    New Prescriptions New Prescriptions   No medications on file  I personally performed the services described in this documentation, which was scribed in my  presence. The recorded information has been reviewed and is accurate.        Veryl Speak, MD 10/01/16 [id number]

## 2016-10-01 NOTE — ED Notes (Signed)
ED Provider at bedside. 

## 2016-10-03 ENCOUNTER — Emergency Department (HOSPITAL_COMMUNITY)
Admission: EM | Admit: 2016-10-03 | Discharge: 2016-10-03 | Disposition: A | Discharge status: Home / Self Care | Payer: Medicare Other | Attending: Emergency Medicine | Admitting: Emergency Medicine

## 2016-10-03 ENCOUNTER — Encounter (HOSPITAL_COMMUNITY): Payer: Self-pay | Admitting: *Deleted

## 2016-10-03 DIAGNOSIS — Z940 Kidney transplant status: Secondary | ICD-10-CM | POA: Insufficient documentation

## 2016-10-03 DIAGNOSIS — E1122 Type 2 diabetes mellitus with diabetic chronic kidney disease: Secondary | ICD-10-CM | POA: Diagnosis not present

## 2016-10-03 DIAGNOSIS — R112 Nausea with vomiting, unspecified: Secondary | ICD-10-CM | POA: Diagnosis not present

## 2016-10-03 DIAGNOSIS — I129 Hypertensive chronic kidney disease with stage 1 through stage 4 chronic kidney disease, or unspecified chronic kidney disease: Secondary | ICD-10-CM | POA: Insufficient documentation

## 2016-10-03 DIAGNOSIS — E211 Secondary hyperparathyroidism, not elsewhere classified: Secondary | ICD-10-CM | POA: Diagnosis not present

## 2016-10-03 DIAGNOSIS — Z79899 Other long term (current) drug therapy: Secondary | ICD-10-CM | POA: Insufficient documentation

## 2016-10-03 DIAGNOSIS — Z794 Long term (current) use of insulin: Secondary | ICD-10-CM | POA: Insufficient documentation

## 2016-10-03 DIAGNOSIS — R197 Diarrhea, unspecified: Secondary | ICD-10-CM | POA: Diagnosis not present

## 2016-10-03 DIAGNOSIS — N182 Chronic kidney disease, stage 2 (mild): Secondary | ICD-10-CM | POA: Insufficient documentation

## 2016-10-03 DIAGNOSIS — R109 Unspecified abdominal pain: Secondary | ICD-10-CM | POA: Diagnosis present

## 2016-10-03 LAB — CBC
HCT: 34.2 % — ABNORMAL LOW (ref 36.0–46.0)
HEMOGLOBIN: 10.8 g/dL — AB (ref 12.0–15.0)
MCH: 27.7 pg (ref 26.0–34.0)
MCHC: 31.6 g/dL (ref 30.0–36.0)
MCV: 87.7 fL (ref 78.0–100.0)
Platelets: 214 10*3/uL (ref 150–400)
RBC: 3.9 MIL/uL (ref 3.87–5.11)
RDW: 14.3 % (ref 11.5–15.5)
WBC: 7.6 10*3/uL (ref 4.0–10.5)

## 2016-10-03 LAB — COMPREHENSIVE METABOLIC PANEL
ALBUMIN: 3.9 g/dL (ref 3.5–5.0)
ALK PHOS: 65 U/L (ref 38–126)
ALT: 15 U/L (ref 14–54)
ANION GAP: 10 (ref 5–15)
AST: 15 U/L (ref 15–41)
BILIRUBIN TOTAL: 0.7 mg/dL (ref 0.3–1.2)
BUN: 27 mg/dL — ABNORMAL HIGH (ref 6–20)
CALCIUM: 7.7 mg/dL — AB (ref 8.9–10.3)
CO2: 25 mmol/L (ref 22–32)
Chloride: 101 mmol/L (ref 101–111)
Creatinine, Ser: 1.92 mg/dL — ABNORMAL HIGH (ref 0.44–1.00)
GFR, EST AFRICAN AMERICAN: 39 mL/min — AB (ref >60)
GFR, EST NON AFRICAN AMERICAN: 33 mL/min — AB (ref >60)
GLUCOSE: 229 mg/dL — AB (ref 65–99)
Potassium: 3.6 mmol/L (ref 3.5–5.1)
Sodium: 136 mmol/L (ref 135–145)
TOTAL PROTEIN: 7 g/dL (ref 6.5–8.1)

## 2016-10-03 LAB — URINALYSIS, ROUTINE W REFLEX MICROSCOPIC
BILIRUBIN URINE: NEGATIVE
Glucose, UA: 50 mg/dL — AB
Hgb urine dipstick: NEGATIVE
KETONES UR: NEGATIVE mg/dL
Leukocytes, UA: NEGATIVE
NITRITE: NEGATIVE
Protein, ur: NEGATIVE mg/dL
SPECIFIC GRAVITY, URINE: 1.013 (ref 1.005–1.030)
pH: 5 (ref 5.0–8.0)

## 2016-10-03 LAB — LIPASE, BLOOD: Lipase: 36 U/L (ref 11–51)

## 2016-10-03 MED ORDER — SODIUM CHLORIDE 0.9 % IV BOLUS (SEPSIS)
1000.0000 mL | Freq: Once | INTRAVENOUS | Status: AC
  Administered 2016-10-03: 1000 mL via INTRAVENOUS

## 2016-10-03 MED ORDER — HYDROMORPHONE HCL 1 MG/ML IJ SOLN
1.0000 mg | Freq: Once | INTRAMUSCULAR | Status: AC
  Administered 2016-10-03: 1 mg via INTRAVENOUS
  Filled 2016-10-03: qty 1

## 2016-10-03 MED ORDER — ONDANSETRON HCL 4 MG/2ML IJ SOLN
4.0000 mg | Freq: Once | INTRAMUSCULAR | Status: AC
  Administered 2016-10-03: 4 mg via INTRAVENOUS
  Filled 2016-10-03: qty 2

## 2016-10-03 NOTE — ED Triage Notes (Signed)
The pt had a kidney transplant in 2007  She is unable to keep her anti rejections drugs down

## 2016-10-03 NOTE — ED Notes (Signed)
Pt unable to volid

## 2016-10-03 NOTE — ED Triage Notes (Signed)
Pt c/o abd pain for 2 days with n and v  lmpnone

## 2016-10-03 NOTE — ED Provider Notes (Signed)
[facility] DEPT Provider Note   CSN: [id number] Arrival date & time: 10/03/16  0039     History   Chief Complaint Chief Complaint  Patient presents with  . Abdominal Pain    HPI [name] is a 31 y.o. female.  Patient with past medical history of renal transplant, diabetes, hypertension, hyperlipidemia, and CKD presents to the emergency department with chief complaint of abdominal pain. She reports associated nausea and vomiting and diarrhea. She states the symptoms started 2 days ago, went away yesterday, and have returned today. She reports being around many sick people at work, who have had similar symptoms. She denies any fevers or chills. She states that she has been unable to tolerate oral intake today.   The history is provided by the patient. No language interpreter was used.    Past Medical History:  Diagnosis Date  . Chronic kidney disease   . Diabetes mellitus    Pt reports diagnosis in June 2011  . Hyperlipidemia   . Hypertension   . Kidney transplant recipient   . UTI (urinary tract infection) 01/09/2015    Patient Active Problem List   Diagnosis Date Noted  . Diabetes ([place]) 05/25/2015  . Acute UTI   . Emesis   . UTI (urinary tract infection) 01/09/2015  . Abdominal pain 01/09/2015  . Nausea and vomiting 01/09/2015  . Acute on chronic kidney failure ([place]) 01/09/2015  . UTI (lower urinary tract infection) 01/09/2015  . Malnutrition of moderate degree 01/06/2015  . Renal failure (ARF), acute on chronic (HCC) 01/05/2015  . Fever, unspecified   . Renal transplant recipient   . Immunosuppressed status ([place])   . Hyponatremia   . Acute renal failure ([place]) 09/30/2014  . Sepsis ([place]) 06/24/2014  . Pyelonephritis 06/23/2014  . Dehydration 12/22/2012  . Chest pain 12/22/2012  . 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 CHRONIC KIDNEY DISEASE STAGE II (MILD) 01/04/2010  . OVARIAN FAILURE,  PREMATURE 03/11/2009  . 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 SECONDARY HYPERPARATHYROIDISM 12/05/2007  . OBESITY 09/25/2007  . Anemia in chronic renal disease 09/25/2007  . LEARNING DISABILITY 09/25/2007  . Essential hypertension, benign 09/25/2007  . KIDNEY TRANSPLANTATION, HX OF 09/25/2007    Past Surgical History:  Procedure Laterality Date  . KIDNEY TRANSPLANT  2007  . RENAL BIOPSY Bilateral 2003  . THYROID LOBECTOMY      OB History    No data available       Home Medications    Prior to Admission medications   Medication Sig Start Date End Date Taking? Authorizing Provider  ACCU-CHEK SOFTCLIX LANCETS lancets Use to check blood sugar 4 times per day. 12/29/15   Renato Shin, MD  ACCU-CHEK SOFTCLIX LANCETS lancets USE TO CHECK BLOOD SUGAR FOUR TIMES DAILY AS DIRECTED 01/01/16   Renato Shin, MD  acetaminophen (TYLENOL) 500 MG tablet Take 1,000-1,500 mg by mouth every 6 (six) hours as needed for mild pain.     [provider]  calcium carbonate (TUMS) 500 MG chewable tablet Chew 2 tablets by mouth 3 (three) times daily as needed for indigestion. Takes on Monday, wednesdays, and fridays    [provider]  cetirizine (ZYRTEC) 10 MG tablet Take 10 mg by mouth daily.    [provider]  cyclobenzaprine (FLEXERIL) 10 MG tablet Take 1 tablet (10 mg total) by mouth 2 (two) times daily as needed for muscle spasms. 07/30/16   Montine Circle, PA-C  diazepam (VALIUM) 5 MG  tablet Take 1 tablet (5 mg total) by mouth every 6 (six) hours as needed for anxiety (spasms). 05/11/15   Elnora Morrison, MD  famotidine (PEPCID) 20 MG tablet Take 1 tablet (20 mg total) by mouth daily. Patient taking differently: Take 20 mg by mouth See admin instructions. M W F 01/12/15   Nita Sells, MD  fluticasone (FLONASE) 50 MCG/ACT nasal spray Place 2 sprays into both nostrils daily. 07/20/15   Muthersbaugh, Jarrett Soho, PA-C  glucose blood (BAYER CONTOUR NEXT TEST) test strip Check 4 times  per day. DX CODE E11.9 06/13/15   Renato Shin, MD  glucose blood [facility] VERIO) test strip Check 4 times per day. DX CODE E11.9 12/19/15   Renato Shin, MD  guaiFENesin (MUCINEX) 600 MG 12 hr tablet Take 2 tablets (1,200 mg total) by mouth 2 (two) times daily. 07/20/15   Muthersbaugh, Jarrett Soho, PA-C  HYDROcodone-acetaminophen (NORCO/VICODIN) 5-325 MG tablet Take 1-2 tablets by mouth every 6 (six) hours as needed. 07/30/16   Montine Circle, PA-C  hydrOXYzine (ATARAX) 25 MG tablet Take 25 mg by mouth 2 (two) times daily as needed for itching.     [provider]  insulin degludec (TRESIBA FLEXTOUCH) 100 UNIT/ML SOPN FlexTouch Pen Inject 1.3 mLs (130 Units total) into the skin daily. And pen needles 1/day 07/22/16   Renato Shin, MD  lisinopril-hydrochlorothiazide (PRINZIDE,ZESTORETIC) 20-25 MG tablet Take 1 tablet by mouth daily as needed (blood pressure).  11/14/14   [provider]  mycophenolate (MYFORTIC) 360 MG TBEC Take 720 mg by mouth 2 (two) times daily.     [provider]  ondansetron (ZOFRAN) 4 MG tablet Take 1 tablet (4 mg total) by mouth every 8 (eight) hours as needed for nausea or vomiting. 08/10/16   Curatolo, Adam, DO  pantoprazole (PROTONIX) 20 MG tablet Take 1 tablet (20 mg total) by mouth daily. 01/12/15   Nita Sells, MD  predniSONE (DELTASONE) 5 MG tablet Take 5 mg by mouth at bedtime.     [provider]  promethazine (PHENERGAN) 12.5 MG tablet Take 1 tablet (12.5 mg total) by mouth every 6 (six) hours as needed for nausea or vomiting. 10/01/14   [name], Ripudeep K, MD  simvastatin (ZOCOR) 20 MG tablet Take 20 mg by mouth 3 (three) times a week. MWF.    [provider]  tacrolimus (PROGRAF) 1 MG capsule Take 2 mg by mouth 2 (two) times daily.     [provider]    Family History Family History  Problem Relation Age of Onset  . Arthritis Mother   . Diabetes Mother   . Hypertension Mother     Social History Social  History  Substance Use Topics  . Smoking status: Never Smoker  . Smokeless tobacco: Never Used  . Alcohol use No     Allergies   Benadryl [diphenhydramine-zinc acetate]; Motrin [ibuprofen]; Banana; Diphenhydramine hcl; Doxycycline; Iron dextran; and Shellfish allergy   Review of Systems Review of Systems  All other systems reviewed and are negative.    Physical Exam Updated Vital Signs BP (!) 137/124   Pulse (!) 102   Temp 98.4 F (36.9 C) (Oral)   Resp 18   Ht 5\' 6"  (1.676 m)   Wt 86.2 kg (190 lb)   SpO2 100%   BMI 30.67 kg/m   Physical Exam  Constitutional: She is oriented to person, place, and time. She appears well-developed and well-nourished.  HENT:  Head: Normocephalic and atraumatic.  Eyes: Pupils are equal, round, and reactive  to light. Conjunctivae and EOM are normal.  Neck: Normal range of motion. Neck supple.  Cardiovascular: Normal rate and regular rhythm.  Exam reveals no gallop and no friction rub.   No murmur heard. Pulmonary/Chest: Effort normal and breath sounds normal. No respiratory distress. She has no wheezes. She has no rales. She exhibits no tenderness.  Abdominal: Soft. Bowel sounds are normal. She exhibits no distension and no mass. There is no tenderness. There is no rebound and no guarding.  Musculoskeletal: Normal range of motion. She exhibits no edema or tenderness.  Neurological: She is alert and oriented to person, place, and time.  Skin: Skin is warm and dry.  Psychiatric: She has a normal mood and affect. Her behavior is normal. Judgment and thought content normal.  Nursing note and vitals reviewed.    ED Treatments / Results  Labs (all labs ordered are listed, but only abnormal results are displayed) Labs Reviewed  CBC - Abnormal; Notable for the following:       Result Value   Hemoglobin 10.8 (*)    HCT 34.2 (*)    All other components within normal limits  LIPASE, BLOOD  COMPREHENSIVE METABOLIC PANEL  URINALYSIS, ROUTINE  W REFLEX MICROSCOPIC    EKG  EKG Interpretation None       Radiology Ct Abdomen Pelvis Wo Contrast  Result Date: 10/01/2016 CLINICAL DATA:  31 year old female with abdominal pain, nausea vomiting. History of kidney transplant. EXAM: CT ABDOMEN AND PELVIS WITHOUT CONTRAST TECHNIQUE: Multidetector CT imaging of the abdomen and pelvis was performed following the standard protocol without IV contrast. COMPARISON:  Abdominal CT dated 08/10/2016 FINDINGS: Evaluation of this exam is limited in the absence of intravenous contrast. Lower chest: The visualized lung bases are clear. Small areas of coronary vascular calcification noted along the inferior heart. The heart is only partially imaged. There is no intra-abdominal free air or free fluid. Hepatobiliary: No focal liver abnormality is seen. No gallstones, gallbladder wall thickening, or biliary dilatation. Pancreas: Unremarkable. No pancreatic ductal dilatation or surrounding inflammatory changes. Spleen: Small focal calcification along the superior aspect of the splenic capsule, likely related to prior inflammation/ infection or hemorrhage. The spleen is otherwise unremarkable. Adrenals/Urinary Tract: The adrenal glands are unremarkable. The right kidney is not visualized. The left kidney is atrophic. The visualized ureters and urinary bladder appear unremarkable. There is a right lower quadrant transplant kidney which measures approximately 15 cm in length. No stone identified in the transplant kidney or visualized portion of the transplant ureter. There is mild fullness of the transplant collecting system with mild haziness of the renal sinus fat as seen on the prior CT of 08/10/2016. There has been significant interval improvement of the hydronephrosis seen on the CT of 07/16/2016. Correlation with urinalysis recommended to exclude UTI. There is no frank hydronephrosis. There is a 2.8 x 2.3 cm low attenuating structure in the interpolar aspect of the  right kidney as seen on the prior CTs most likely a cyst. This appears to be within the renal parenchyma and less likely a perinephric collection. A smaller upper pole cyst is also seen similar to prior studies. There is no peritransplant fluid collection. Stomach/Bowel: There is no evidence of bowel obstruction or active inflammation. Normal appendix. Vascular/Lymphatic: The abdominal aorta and IVC are grossly unremarkable on this noncontrast study. No portal venous gas identified. There is no adenopathy. Reproductive: The uterus is not well visualized and may be atrophic or surgically absent. No pelvic mass. Other: There is a  midline vertical anterior abdominal wall incisional scar. No fluid collection. Musculoskeletal: No acute or significant osseous findings. IMPRESSION: 1. Right lower quadrant transplant kidney with mild pelvocaliectasis similar to the CT of 08/10/2016 with significant interval improvement of the hydronephrosis seen on the CT of 07/16/2016. Correlation with urinalysis recommended to exclude UTI. No stone or peritransplant collection noted. 2. No bowel obstruction or active inflammation.  Normal appendix. Electronically Signed   By: Anner Crete M.D.   On: 10/01/2016 02:18    Procedures Procedures (including critical care time)  Medications Ordered in ED Medications  sodium chloride 0.9 % bolus 1,000 mL (not administered)  HYDROmorphone (DILAUDID) injection 1 mg (1 mg Intravenous Given 10/03/16 0125)  ondansetron (ZOFRAN) injection 4 mg (4 mg Intravenous Given 10/03/16 0126)     Initial Impression / Assessment and Plan / ED Course  I have reviewed the triage vital signs and the nursing notes.  Pertinent labs & imaging results that were available during my care of the patient were reviewed by me and considered in my medical decision making (see chart for details).     Patient with abdominal pain, nausea, and vomiting. Symptoms started 2 days ago, went away, and then  returned today. She reports many sick contacts at work, but similar symptoms. She denies any fevers or chills. She denies having been able to tolerate any oral intake today. She was seen 2 days ago and had a reassuring CT scan of her abdomen. Do not believe that we need to have repeat imaging done today. Will give fluids, pain and nausea medicine, and will reassess. Laboratory workup is pending.  Labs are reassuring.  Mildly increased creatinine in comparison, likely dry from n/v/d.  Given fluids in the Ed.  Feels better now.  Tolerating POs.  She has follow-up with her doctor on Monday.  Return precautions given.  Recent CT reviewed.  Patient feels comfortable with discharge.  Final Clinical Impressions(s) / ED Diagnoses   Final diagnoses:  Nausea vomiting and diarrhea    New Prescriptions New Prescriptions   No medications on file     Montine Circle, Hershal Coria 10/03/16 [id number]    Ripley Fraise, MD 10/03/16 [phone]

## 2016-10-06 DIAGNOSIS — K219 Gastro-esophageal reflux disease without esophagitis: Secondary | ICD-10-CM | POA: Diagnosis not present

## 2016-10-06 DIAGNOSIS — E1121 Type 2 diabetes mellitus with diabetic nephropathy: Secondary | ICD-10-CM | POA: Diagnosis not present

## 2016-10-06 DIAGNOSIS — I10 Essential (primary) hypertension: Secondary | ICD-10-CM | POA: Diagnosis not present

## 2016-10-14 ENCOUNTER — Encounter (HOSPITAL_COMMUNITY)
Admission: RE | Admit: 2016-10-14 | Discharge: 2016-10-14 | Disposition: A | Discharge status: Home / Self Care | Payer: Medicare Other | Source: Ambulatory Visit | Attending: Nephrology | Admitting: Nephrology

## 2016-10-14 DIAGNOSIS — D631 Anemia in chronic kidney disease: Secondary | ICD-10-CM | POA: Insufficient documentation

## 2016-10-14 DIAGNOSIS — N183 Chronic kidney disease, stage 3 (moderate): Secondary | ICD-10-CM | POA: Diagnosis not present

## 2016-10-14 DIAGNOSIS — N182 Chronic kidney disease, stage 2 (mild): Secondary | ICD-10-CM

## 2016-10-14 LAB — POCT HEMOGLOBIN-HEMACUE: HEMOGLOBIN: 10.8 g/dL — AB (ref 12.0–15.0)

## 2016-10-14 MED ORDER — EPOETIN ALFA 10000 UNIT/ML IJ SOLN
INTRAMUSCULAR | Status: AC
  Administered 2016-10-14: 10000 [IU]
  Filled 2016-10-14: qty 1

## 2016-10-14 MED ORDER — EPOETIN ALFA 20000 UNIT/ML IJ SOLN
INTRAMUSCULAR | Status: AC
  Administered 2016-10-14: 20000 [IU]
  Filled 2016-10-14: qty 1

## 2016-10-14 MED ORDER — EPOETIN ALFA 40000 UNIT/ML IJ SOLN
30000.0000 [IU] | INTRAMUSCULAR | Status: DC
Start: 2016-10-14 — End: 2016-10-15

## 2016-10-28 ENCOUNTER — Encounter (HOSPITAL_COMMUNITY)
Admission: RE | Admit: 2016-10-28 | Discharge: 2016-10-28 | Disposition: A | Discharge status: Home / Self Care | Payer: Medicare Other | Source: Ambulatory Visit | Attending: Nephrology | Admitting: Nephrology

## 2016-10-28 DIAGNOSIS — N182 Chronic kidney disease, stage 2 (mild): Secondary | ICD-10-CM

## 2016-10-28 DIAGNOSIS — N183 Chronic kidney disease, stage 3 (moderate): Secondary | ICD-10-CM | POA: Diagnosis not present

## 2016-10-28 LAB — FERRITIN: Ferritin: 892 ng/mL — ABNORMAL HIGH (ref 11–307)

## 2016-10-28 LAB — POCT HEMOGLOBIN-HEMACUE: HEMOGLOBIN: 10.9 g/dL — AB (ref 12.0–15.0)

## 2016-10-28 LAB — IRON AND TIBC
Iron: 53 ug/dL (ref 28–170)
SATURATION RATIOS: 23 % (ref 10.4–31.8)
TIBC: 235 ug/dL — ABNORMAL LOW (ref 250–450)
UIBC: 182 ug/dL

## 2016-10-28 MED ORDER — EPOETIN ALFA 10000 UNIT/ML IJ SOLN
INTRAMUSCULAR | Status: AC
  Administered 2016-10-28: 10000 [IU] via SUBCUTANEOUS
  Filled 2016-10-28: qty 1

## 2016-10-28 MED ORDER — EPOETIN ALFA 20000 UNIT/ML IJ SOLN
INTRAMUSCULAR | Status: AC
  Administered 2016-10-28: 20000 [IU] via SUBCUTANEOUS
  Filled 2016-10-28: qty 1

## 2016-10-28 MED ORDER — EPOETIN ALFA 40000 UNIT/ML IJ SOLN: 30000.0000 [IU] | INTRAMUSCULAR | Status: DC

## 2016-11-04 ENCOUNTER — Ambulatory Visit: Payer: Medicare Other | Admitting: Endocrinology

## 2016-11-08 DIAGNOSIS — E0842 Diabetes mellitus due to underlying condition with diabetic polyneuropathy: Secondary | ICD-10-CM | POA: Diagnosis not present

## 2016-11-08 DIAGNOSIS — I10 Essential (primary) hypertension: Secondary | ICD-10-CM | POA: Diagnosis not present

## 2016-11-08 DIAGNOSIS — Z23 Encounter for immunization: Secondary | ICD-10-CM | POA: Diagnosis not present

## 2016-11-08 DIAGNOSIS — E1121 Type 2 diabetes mellitus with diabetic nephropathy: Secondary | ICD-10-CM | POA: Diagnosis not present

## 2016-11-08 DIAGNOSIS — K219 Gastro-esophageal reflux disease without esophagitis: Secondary | ICD-10-CM | POA: Diagnosis not present

## 2016-11-11 ENCOUNTER — Encounter (HOSPITAL_COMMUNITY)
Admission: RE | Admit: 2016-11-11 | Discharge: 2016-11-11 | Disposition: A | Discharge status: Home / Self Care | Payer: Medicare Other | Source: Ambulatory Visit | Attending: Nephrology | Admitting: Nephrology

## 2016-11-11 DIAGNOSIS — N182 Chronic kidney disease, stage 2 (mild): Secondary | ICD-10-CM

## 2016-11-11 DIAGNOSIS — N183 Chronic kidney disease, stage 3 (moderate): Secondary | ICD-10-CM | POA: Diagnosis not present

## 2016-11-11 LAB — POCT HEMOGLOBIN-HEMACUE: Hemoglobin: 10.7 g/dL — ABNORMAL LOW (ref 12.0–15.0)

## 2016-11-11 MED ORDER — EPOETIN ALFA 20000 UNIT/ML IJ SOLN
INTRAMUSCULAR | Status: AC
  Administered 2016-11-11: 20000 [IU]
  Filled 2016-11-11: qty 1

## 2016-11-11 MED ORDER — EPOETIN ALFA 10000 UNIT/ML IJ SOLN
INTRAMUSCULAR | Status: AC
  Administered 2016-11-11: 10000 [IU]
  Filled 2016-11-11: qty 1

## 2016-11-11 MED ORDER — EPOETIN ALFA 40000 UNIT/ML IJ SOLN: 30000.0000 [IU] | INTRAMUSCULAR | Status: DC

## 2016-11-25 ENCOUNTER — Encounter (HOSPITAL_COMMUNITY)
Admission: RE | Admit: 2016-11-25 | Discharge: 2016-11-25 | Disposition: A | Discharge status: Home / Self Care | Payer: Medicare Other | Source: Ambulatory Visit | Attending: Nephrology | Admitting: Nephrology

## 2016-11-25 DIAGNOSIS — D631 Anemia in chronic kidney disease: Secondary | ICD-10-CM | POA: Diagnosis not present

## 2016-11-25 DIAGNOSIS — N182 Chronic kidney disease, stage 2 (mild): Secondary | ICD-10-CM

## 2016-11-25 DIAGNOSIS — N183 Chronic kidney disease, stage 3 (moderate): Secondary | ICD-10-CM | POA: Insufficient documentation

## 2016-11-25 LAB — FERRITIN: FERRITIN: 1030 ng/mL — AB (ref 11–307)

## 2016-11-25 LAB — POCT HEMOGLOBIN-HEMACUE: HEMOGLOBIN: 10.4 g/dL — AB (ref 12.0–15.0)

## 2016-11-25 LAB — IRON AND TIBC
Iron: 52 ug/dL (ref 28–170)
SATURATION RATIOS: 23 % (ref 10.4–31.8)
TIBC: 225 ug/dL — AB (ref 250–450)
UIBC: 173 ug/dL

## 2016-11-25 MED ORDER — EPOETIN ALFA 40000 UNIT/ML IJ SOLN: 30000.0000 [IU] | INTRAMUSCULAR | Status: DC

## 2016-11-25 MED ORDER — EPOETIN ALFA 10000 UNIT/ML IJ SOLN
INTRAMUSCULAR | Status: AC
  Administered 2016-11-25: 10000 [IU]
  Filled 2016-11-25: qty 1

## 2016-11-25 MED ORDER — EPOETIN ALFA 20000 UNIT/ML IJ SOLN
INTRAMUSCULAR | Status: AC
  Administered 2016-11-25: 20000 [IU]
  Filled 2016-11-25: qty 1

## 2016-12-08 ENCOUNTER — Other Ambulatory Visit: Payer: Self-pay | Admitting: Nephrology

## 2016-12-08 DIAGNOSIS — E1122 Type 2 diabetes mellitus with diabetic chronic kidney disease: Secondary | ICD-10-CM

## 2016-12-08 DIAGNOSIS — R110 Nausea: Secondary | ICD-10-CM | POA: Diagnosis not present

## 2016-12-08 DIAGNOSIS — G4489 Other headache syndrome: Secondary | ICD-10-CM | POA: Diagnosis not present

## 2016-12-08 DIAGNOSIS — R1084 Generalized abdominal pain: Secondary | ICD-10-CM | POA: Insufficient documentation

## 2016-12-08 DIAGNOSIS — R112 Nausea with vomiting, unspecified: Secondary | ICD-10-CM | POA: Diagnosis not present

## 2016-12-08 DIAGNOSIS — Z79899 Other long term (current) drug therapy: Secondary | ICD-10-CM

## 2016-12-08 DIAGNOSIS — I120 Hypertensive chronic kidney disease with stage 5 chronic kidney disease or end stage renal disease: Secondary | ICD-10-CM | POA: Diagnosis not present

## 2016-12-08 DIAGNOSIS — Z794 Long term (current) use of insulin: Secondary | ICD-10-CM | POA: Diagnosis not present

## 2016-12-08 DIAGNOSIS — R079 Chest pain, unspecified: Secondary | ICD-10-CM | POA: Diagnosis not present

## 2016-12-08 DIAGNOSIS — R1013 Epigastric pain: Secondary | ICD-10-CM | POA: Diagnosis not present

## 2016-12-08 DIAGNOSIS — E1165 Type 2 diabetes mellitus with hyperglycemia: Secondary | ICD-10-CM | POA: Diagnosis not present

## 2016-12-08 DIAGNOSIS — G43A1 Cyclical vomiting, intractable: Secondary | ICD-10-CM | POA: Diagnosis not present

## 2016-12-08 DIAGNOSIS — Z940 Kidney transplant status: Secondary | ICD-10-CM

## 2016-12-08 DIAGNOSIS — N182 Chronic kidney disease, stage 2 (mild): Secondary | ICD-10-CM | POA: Insufficient documentation

## 2016-12-08 DIAGNOSIS — I129 Hypertensive chronic kidney disease with stage 1 through stage 4 chronic kidney disease, or unspecified chronic kidney disease: Secondary | ICD-10-CM

## 2016-12-08 DIAGNOSIS — E1043 Type 1 diabetes mellitus with diabetic autonomic (poly)neuropathy: Secondary | ICD-10-CM | POA: Diagnosis not present

## 2016-12-08 DIAGNOSIS — R51 Headache: Secondary | ICD-10-CM

## 2016-12-08 DIAGNOSIS — R1011 Right upper quadrant pain: Secondary | ICD-10-CM | POA: Diagnosis not present

## 2016-12-08 DIAGNOSIS — R609 Edema, unspecified: Secondary | ICD-10-CM

## 2016-12-08 DIAGNOSIS — N179 Acute kidney failure, unspecified: Secondary | ICD-10-CM | POA: Diagnosis not present

## 2016-12-08 DIAGNOSIS — R1012 Left upper quadrant pain: Secondary | ICD-10-CM | POA: Diagnosis not present

## 2016-12-08 DIAGNOSIS — K3184 Gastroparesis: Secondary | ICD-10-CM | POA: Diagnosis not present

## 2016-12-08 DIAGNOSIS — N186 End stage renal disease: Secondary | ICD-10-CM | POA: Diagnosis not present

## 2016-12-08 DIAGNOSIS — R109 Unspecified abdominal pain: Secondary | ICD-10-CM | POA: Diagnosis not present

## 2016-12-09 ENCOUNTER — Encounter (HOSPITAL_COMMUNITY): Payer: Self-pay | Admitting: Emergency Medicine

## 2016-12-09 ENCOUNTER — Emergency Department (HOSPITAL_COMMUNITY): Payer: Medicare Other

## 2016-12-09 ENCOUNTER — Encounter (HOSPITAL_COMMUNITY)
Admission: RE | Admit: 2016-12-09 | Discharge: 2016-12-09 | Disposition: A | Discharge status: Home / Self Care | Payer: Medicare Other | Source: Ambulatory Visit | Attending: Nephrology | Admitting: Nephrology

## 2016-12-09 ENCOUNTER — Emergency Department (HOSPITAL_COMMUNITY)
Admission: EM | Admit: 2016-12-09 | Discharge: 2016-12-09 | Disposition: A | Discharge status: Home / Self Care | Payer: Medicare Other | Source: Home / Self Care | Attending: Emergency Medicine | Admitting: Emergency Medicine

## 2016-12-09 DIAGNOSIS — Z940 Kidney transplant status: Secondary | ICD-10-CM

## 2016-12-09 DIAGNOSIS — R1013 Epigastric pain: Secondary | ICD-10-CM | POA: Diagnosis not present

## 2016-12-09 DIAGNOSIS — E1122 Type 2 diabetes mellitus with diabetic chronic kidney disease: Secondary | ICD-10-CM

## 2016-12-09 DIAGNOSIS — N189 Chronic kidney disease, unspecified: Secondary | ICD-10-CM | POA: Insufficient documentation

## 2016-12-09 DIAGNOSIS — R079 Chest pain, unspecified: Secondary | ICD-10-CM | POA: Diagnosis not present

## 2016-12-09 DIAGNOSIS — R1012 Left upper quadrant pain: Secondary | ICD-10-CM | POA: Diagnosis not present

## 2016-12-09 DIAGNOSIS — Z794 Long term (current) use of insulin: Secondary | ICD-10-CM

## 2016-12-09 DIAGNOSIS — R112 Nausea with vomiting, unspecified: Secondary | ICD-10-CM

## 2016-12-09 DIAGNOSIS — Z79899 Other long term (current) drug therapy: Secondary | ICD-10-CM

## 2016-12-09 DIAGNOSIS — R1011 Right upper quadrant pain: Secondary | ICD-10-CM | POA: Diagnosis not present

## 2016-12-09 DIAGNOSIS — G4489 Other headache syndrome: Secondary | ICD-10-CM

## 2016-12-09 DIAGNOSIS — R1010 Upper abdominal pain, unspecified: Secondary | ICD-10-CM

## 2016-12-09 DIAGNOSIS — I129 Hypertensive chronic kidney disease with stage 1 through stage 4 chronic kidney disease, or unspecified chronic kidney disease: Secondary | ICD-10-CM

## 2016-12-09 DIAGNOSIS — N182 Chronic kidney disease, stage 2 (mild): Secondary | ICD-10-CM

## 2016-12-09 DIAGNOSIS — R1110 Vomiting, unspecified: Secondary | ICD-10-CM

## 2016-12-09 DIAGNOSIS — R109 Unspecified abdominal pain: Secondary | ICD-10-CM | POA: Diagnosis not present

## 2016-12-09 DIAGNOSIS — R1084 Generalized abdominal pain: Secondary | ICD-10-CM

## 2016-12-09 LAB — URINALYSIS, ROUTINE W REFLEX MICROSCOPIC
Bacteria, UA: NONE SEEN
Bilirubin Urine: NEGATIVE
GLUCOSE, UA: 50 mg/dL — AB
Hgb urine dipstick: NEGATIVE
KETONES UR: NEGATIVE mg/dL
Leukocytes, UA: NEGATIVE
NITRITE: NEGATIVE
PH: 5 (ref 5.0–8.0)
PROTEIN: 30 mg/dL — AB
SPECIFIC GRAVITY, URINE: 1.014 (ref 1.005–1.030)

## 2016-12-09 LAB — HEPATIC FUNCTION PANEL
ALBUMIN: 3.8 g/dL (ref 3.5–5.0)
ALT: 14 U/L (ref 14–54)
AST: 16 U/L (ref 15–41)
Alkaline Phosphatase: 78 U/L (ref 38–126)
BILIRUBIN TOTAL: 0.6 mg/dL (ref 0.3–1.2)
Bilirubin, Direct: 0.1 mg/dL (ref 0.1–0.5)
Indirect Bilirubin: 0.5 mg/dL (ref 0.3–0.9)
TOTAL PROTEIN: 7.5 g/dL (ref 6.5–8.1)

## 2016-12-09 LAB — BASIC METABOLIC PANEL
Anion gap: 10 (ref 5–15)
BUN: 27 mg/dL — AB (ref 6–20)
CHLORIDE: 103 mmol/L (ref 101–111)
CO2: 24 mmol/L (ref 22–32)
Calcium: 7.9 mg/dL — ABNORMAL LOW (ref 8.9–10.3)
Creatinine, Ser: 1.5 mg/dL — ABNORMAL HIGH (ref 0.44–1.00)
GFR calc Af Amer: 52 mL/min — ABNORMAL LOW (ref >60)
GFR calc non Af Amer: 45 mL/min — ABNORMAL LOW (ref >60)
GLUCOSE: 171 mg/dL — AB (ref 65–99)
POTASSIUM: 3.5 mmol/L (ref 3.5–5.1)
Sodium: 137 mmol/L (ref 135–145)

## 2016-12-09 LAB — CBC
HCT: 33.9 % — ABNORMAL LOW (ref 36.0–46.0)
Hemoglobin: 10.8 g/dL — ABNORMAL LOW (ref 12.0–15.0)
MCH: 27.9 pg (ref 26.0–34.0)
MCHC: 31.9 g/dL (ref 30.0–36.0)
MCV: 87.6 fL (ref 78.0–100.0)
PLATELETS: 211 10*3/uL (ref 150–400)
RBC: 3.87 MIL/uL (ref 3.87–5.11)
RDW: 14.9 % (ref 11.5–15.5)
WBC: 8.8 10*3/uL (ref 4.0–10.5)

## 2016-12-09 LAB — LIPASE, BLOOD: LIPASE: 30 U/L (ref 11–51)

## 2016-12-09 LAB — I-STAT BETA HCG BLOOD, ED (MC, WL, AP ONLY): I-stat hCG, quantitative: 5.3 m[IU]/mL — ABNORMAL HIGH (ref <5)

## 2016-12-09 LAB — I-STAT TROPONIN, ED: TROPONIN I, POC: 0 ng/mL (ref 0.00–0.08)

## 2016-12-09 LAB — POCT HEMOGLOBIN-HEMACUE: HEMOGLOBIN: 11.2 g/dL — AB (ref 12.0–15.0)

## 2016-12-09 MED ORDER — SODIUM CHLORIDE 0.9 % IV BOLUS (SEPSIS)
1000.0000 mL | Freq: Once | INTRAVENOUS | Status: AC
  Administered 2016-12-09: 1000 mL via INTRAVENOUS

## 2016-12-09 MED ORDER — HYDROMORPHONE HCL 1 MG/ML IJ SOLN
1.0000 mg | Freq: Once | INTRAMUSCULAR | Status: AC
  Administered 2016-12-09: 1 mg via INTRAVENOUS
  Filled 2016-12-09: qty 1

## 2016-12-09 MED ORDER — ONDANSETRON HCL 4 MG/2ML IJ SOLN
4.0000 mg | Freq: Once | INTRAMUSCULAR | Status: AC
  Administered 2016-12-09: 4 mg via INTRAVENOUS
  Filled 2016-12-09: qty 2

## 2016-12-09 MED ORDER — ONDANSETRON HCL 4 MG/2ML IJ SOLN: 4.0000 mg | Freq: Once | INTRAMUSCULAR | Status: DC

## 2016-12-09 MED ORDER — FENTANYL CITRATE (PF) 100 MCG/2ML IJ SOLN
100.0000 ug | Freq: Once | INTRAMUSCULAR | Status: AC
  Administered 2016-12-09: 100 ug via INTRAVENOUS

## 2016-12-09 MED ORDER — EPOETIN ALFA 10000 UNIT/ML IJ SOLN
INTRAMUSCULAR | Status: AC
  Administered 2016-12-09: 10000 [IU]
  Filled 2016-12-09: qty 1

## 2016-12-09 MED ORDER — EPOETIN ALFA 40000 UNIT/ML IJ SOLN: 30000.0000 [IU] | INTRAMUSCULAR | Status: DC

## 2016-12-09 MED ORDER — GI COCKTAIL ~~LOC~~
30.0000 mL | Freq: Once | ORAL | Status: DC
  Filled 2016-12-09: qty 30

## 2016-12-09 MED ORDER — EPOETIN ALFA 20000 UNIT/ML IJ SOLN
INTRAMUSCULAR | Status: AC
  Administered 2016-12-09: 20000 [IU]
  Filled 2016-12-09: qty 1

## 2016-12-09 MED ORDER — ONDANSETRON 4 MG PO TBDP: 4.0000 mg | ORAL_TABLET | Freq: Once | ORAL | Status: DC

## 2016-12-09 MED ORDER — FENTANYL CITRATE (PF) 100 MCG/2ML IJ SOLN
100.0000 ug | Freq: Once | INTRAMUSCULAR | Status: DC
  Filled 2016-12-09: qty 2

## 2016-12-09 NOTE — Care Management (Signed)
ED CM noted patient have had 10 ED visits in the past 6 months, and this is here second visit today, for similar complaints none which  resulted in a hospitalizations Patient is a Medicare beneficiary PCP is Dr Jeanie Cooks. CM met with patient at bedside confirmed information. Patient reports return visit today is for abdominal pain that was not relieved.  CM discussed how to access and receive care in the most appropriate settings based on symptoms. Patient denies any barriers to care at this time. Patient instructed to contact her PCP to schedule a post ED visit appt for follow up within the next 3 days. Patient verbalized understanding and is agreeable teach back was done. No further questions or concerns verbalized by patient. Updated EDP on transitional care planning who is agreeable with plan.

## 2016-12-09 NOTE — Discharge Instructions (Signed)

## 2016-12-09 NOTE — ED Provider Notes (Signed)
[facility] DEPT Provider Note   CSN: [id number] Arrival date & time: 12/08/16  2349     History   Chief Complaint Chief Complaint  Patient presents with  . Chest Pain  . Facial Pain    HPI [name] is a 31 y.o. female.  The history is provided by the patient.  Headache   This is a new problem. The current episode started more than 2 days ago. The problem occurs constantly. The problem has been gradually worsening. The pain is located in the right unilateral region. The pain is moderate. Associated symptoms include shortness of breath, nausea and vomiting. Pertinent negatives include no fever.  Patient with h/o CKD s/p renal transplant She is diabetic She has h/o HTN She presents with multiple complaints:  1. She reports HA for past 2-3 days.  It radiates from right side of head down into face and ear.  She reports tingling on face and ear and ringing in ear.  No focal weakness reported She has had nausea/vomiting.  2. She also reports pain from her head also moves into her chest pain.  She reports shortness of breath.  3. Her main concern is left LE swelling for past several days, unclear etiology.  She called her nephrologist who has ordered outpatient testing.  4. She also mention that all other pain complaints have worsened her abdominal pain which she has been evaluated for multiple times previously.  Past Medical History:  Diagnosis Date  . Chronic kidney disease   . Diabetes mellitus    Pt reports diagnosis in June 2011  . Hyperlipidemia   . Hypertension   . Kidney transplant recipient   . UTI (urinary tract infection) 01/09/2015    Patient Active Problem List   Diagnosis Date Noted  . Diabetes ([place]) 05/25/2015  . Acute UTI   . Emesis   . UTI (urinary tract infection) 01/09/2015  . Abdominal pain 01/09/2015  . Nausea and vomiting 01/09/2015  . Acute on chronic kidney failure ([place]) 01/09/2015  . UTI (lower urinary tract infection) 01/09/2015  .  Malnutrition of moderate degree 01/06/2015  . Renal failure (ARF), acute on chronic (HCC) 01/05/2015  . Fever, unspecified   . Renal transplant recipient   . Immunosuppressed status ([place])   . Hyponatremia   . Acute renal failure ([place]) 09/30/2014  . Sepsis ([place]) 06/24/2014  . Pyelonephritis 06/23/2014  . Dehydration 12/22/2012  . Chest pain 12/22/2012  . 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 CHRONIC KIDNEY DISEASE STAGE II (MILD) 01/04/2010  . OVARIAN FAILURE, PREMATURE 03/11/2009  . 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 SECONDARY HYPERPARATHYROIDISM 12/05/2007  . OBESITY 09/25/2007  . Anemia in chronic renal disease 09/25/2007  . LEARNING DISABILITY 09/25/2007  . Essential hypertension, benign 09/25/2007  . KIDNEY TRANSPLANTATION, HX OF 09/25/2007    Past Surgical History:  Procedure Laterality Date  . KIDNEY TRANSPLANT  2007  . RENAL BIOPSY Bilateral 2003  . THYROID LOBECTOMY      OB History    No data available       Home Medications    Prior to Admission medications   Medication Sig Start Date End Date Taking? Authorizing Provider  ACCU-CHEK SOFTCLIX LANCETS lancets Use to check blood sugar 4 times per day. 12/29/15   Renato Shin, MD  ACCU-CHEK SOFTCLIX LANCETS lancets USE TO CHECK BLOOD SUGAR FOUR TIMES DAILY AS DIRECTED 01/01/16   Renato Shin, MD  acetaminophen (TYLENOL) 500 MG tablet Take 1,000-1,500  mg by mouth every 6 (six) hours as needed for mild pain.     [provider]  calcium carbonate (TUMS) 500 MG chewable tablet Chew 2 tablets by mouth 3 (three) times daily as needed for indigestion. Takes on Monday, wednesdays, and fridays    [provider]  cetirizine (ZYRTEC) 10 MG tablet Take 10 mg by mouth daily.    [provider]  cyclobenzaprine (FLEXERIL) 10 MG tablet Take 1 tablet (10 mg total) by mouth 2 (two) times daily as needed for muscle spasms. 07/30/16   Montine Circle,  PA-C  diazepam (VALIUM) 5 MG tablet Take 1 tablet (5 mg total) by mouth every 6 (six) hours as needed for anxiety (spasms). 05/11/15   Elnora Morrison, MD  famotidine (PEPCID) 20 MG tablet Take 1 tablet (20 mg total) by mouth daily. Patient taking differently: Take 20 mg by mouth See admin instructions. M W F 01/12/15   Nita Sells, MD  fluticasone (FLONASE) 50 MCG/ACT nasal spray Place 2 sprays into both nostrils daily. 07/20/15   Muthersbaugh, Jarrett Soho, PA-C  glucose blood (BAYER CONTOUR NEXT TEST) test strip Check 4 times per day. DX CODE E11.9 06/13/15   Renato Shin, MD  glucose blood [facility] VERIO) test strip Check 4 times per day. DX CODE E11.9 12/19/15   Renato Shin, MD  guaiFENesin (MUCINEX) 600 MG 12 hr tablet Take 2 tablets (1,200 mg total) by mouth 2 (two) times daily. 07/20/15   Muthersbaugh, Jarrett Soho, PA-C  HYDROcodone-acetaminophen (NORCO/VICODIN) 5-325 MG tablet Take 1-2 tablets by mouth every 6 (six) hours as needed. 07/30/16   Montine Circle, PA-C  hydrOXYzine (ATARAX) 25 MG tablet Take 25 mg by mouth 2 (two) times daily as needed for itching.     [provider]  insulin degludec (TRESIBA FLEXTOUCH) 100 UNIT/ML SOPN FlexTouch Pen Inject 1.3 mLs (130 Units total) into the skin daily. And pen needles 1/day 07/22/16   Renato Shin, MD  lisinopril-hydrochlorothiazide (PRINZIDE,ZESTORETIC) 20-25 MG tablet Take 1 tablet by mouth daily as needed (blood pressure).  11/14/14   [provider]  mycophenolate (MYFORTIC) 360 MG TBEC Take 720 mg by mouth 2 (two) times daily.     [provider]  ondansetron (ZOFRAN) 4 MG tablet Take 1 tablet (4 mg total) by mouth every 8 (eight) hours as needed for nausea or vomiting. 08/10/16   Curatolo, Adam, DO  pantoprazole (PROTONIX) 20 MG tablet Take 1 tablet (20 mg total) by mouth daily. 01/12/15   Nita Sells, MD  predniSONE (DELTASONE) 5 MG tablet Take 5 mg by mouth at bedtime.     [provider]    promethazine (PHENERGAN) 12.5 MG tablet Take 1 tablet (12.5 mg total) by mouth every 6 (six) hours as needed for nausea or vomiting. 10/01/14   [name], Ripudeep K, MD  simvastatin (ZOCOR) 20 MG tablet Take 20 mg by mouth 3 (three) times a week. MWF.    [provider]  tacrolimus (PROGRAF) 1 MG capsule Take 2 mg by mouth 2 (two) times daily.     [provider]    Family History Family History  Problem Relation Age of Onset  . Arthritis Mother   . Diabetes Mother   . Hypertension Mother     Social History Social History  Substance Use Topics  . Smoking status: Never Smoker  . Smokeless tobacco: Never Used  . Alcohol use No     Allergies   Benadryl [diphenhydramine-zinc acetate]; Motrin [ibuprofen]; Banana; Diphenhydramine hcl; Doxycycline; Iron  dextran; and Shellfish allergy   Review of Systems Review of Systems  Constitutional: Negative for fever.  Respiratory: Positive for shortness of breath.   Cardiovascular: Positive for chest pain and leg swelling.  Gastrointestinal: Positive for nausea and vomiting.  Neurological: Positive for headaches.  All other systems reviewed and are negative.    Physical Exam Updated Vital Signs BP (!) 115/51   Pulse 96   Temp 98.7 F (37.1 C) (Oral)   Resp 18   Ht 1.676 m (5\' 6" )   Wt 82.6 kg (182 lb)   SpO2 97%   BMI 29.38 kg/m   Physical Exam CONSTITUTIONAL: Well developed/well nourished, anxious moanging, writhing in bed HEAD: Normocephalic/atraumatic EYES: EOMI/PERRL ENMT: Mucous membranes moist, tenderness to right face, no bruising, no erythema/rash noted Right TM clear/intact, ears symmetric NECK: supple no meningeal signs SPINE/BACK:entire spine nontender CV: S1/S2 noted, no murmurs/rubs/gallops noted LUNGS: Lungs are clear to auscultation bilaterally, no apparent distress ABDOMEN: soft, diffuse mild tenderness, no rebound or guarding, bowel sounds noted throughout abdomen GU:no cva tenderness NEURO:  Pt is awake/alert/appropriate, moves all extremitiesx4.  No facial droop.  No arm/leg drift She reports tingling pain in right face/right ear EXTREMITIES: pulses normal/equalx4,  full ROM, mild tenderness to left foot.  No erythema.  No calf tenderness/edema Distal pulses intact SKIN: warm, color normal PSYCH: anxious  ED Treatments / Results  Labs (all labs ordered are listed, but only abnormal results are displayed) Labs Reviewed  BASIC METABOLIC PANEL - Abnormal; Notable for the following:       Result Value   Glucose, Bld 171 (*)    BUN 27 (*)    Creatinine, Ser 1.50 (*)    Calcium 7.9 (*)    GFR calc non Af Amer 45 (*)    GFR calc Af Amer 52 (*)    All other components within normal limits  CBC - Abnormal; Notable for the following:    Hemoglobin 10.8 (*)    HCT 33.9 (*)    All other components within normal limits  I-STAT BETA HCG BLOOD, ED ([facility], [facility], [facility] ONLY) - Abnormal; Notable for the following:    I-stat hCG, quantitative 5.3 (*)    All other components within normal limits  I-STAT TROPONIN, ED  elevated HCG seen in prior labs, unlikely pregnant   EKG  EKG Interpretation  Date/Time:  Thursday December 09 2016 00:13:23 EDT Ventricular Rate:  105 PR Interval:  136 QRS Duration: 76 QT Interval:  346 QTC Calculation: 457 R Axis:   97 Text Interpretation:  Sinus tachycardia Rightward axis Nonspecific T wave abnormality Abnormal ECG Confirmed by Ripley Fraise [phone]) on 12/09/2016 3:59:49 AM       Radiology Dg Chest 2 View  Result Date: 12/09/2016 CLINICAL DATA:  Chest pain. EXAM: CHEST  2 VIEW COMPARISON:  Radiograph 06/16/2016 FINDINGS: The cardiomediastinal contours are normal. Mild lung base atelectasis. Pulmonary vasculature is normal. No consolidation, pleural effusion, or pneumothorax. No acute osseous abnormalities are seen. IMPRESSION: Mild basilar atelectasis. Electronically Signed   By: Jeb Levering M.D.   On: 12/09/2016 01:06     Procedures Procedures (including critical care time)  Medications Ordered in ED Medications  ondansetron (ZOFRAN) injection 4 mg (4 mg Intravenous Given 12/09/16 0429)  sodium chloride 0.9 % bolus 1,000 mL (0 mLs Intravenous Stopped 12/09/16 0553)  HYDROmorphone (DILAUDID) injection 1 mg (1 mg Intravenous Given 12/09/16 0553)     Initial Impression / Assessment and Plan / ED Course  I have  reviewed the triage vital signs and the nursing notes.  Pertinent labs & imaging results that were available during my care of the patient were reviewed by me and considered in my medical decision making (see chart for details).     5:11 AM Pt with multiple chronic illnesses presents with multiple complaints This history is very challenging as pt is moaning throughout exam and reports pain from HA to chest to abdomen to left foot HA//nausea/vomting could be due to HTN emergency CT head ordered Will follow closely 6:11 AM Pt improved BP is improved Awaiting CT imaging 7:17 AM Pt reports all symptoms resolved No HA reported Tingling in face is resolved I doubt TIA/CVA at this time I suspect some of her symptoms were from elevated BP which is improved  Overall well appearing She prefers to be discharged Will d/c home  As for left LE - no signs of DVT/cellulitis/trauma Final Clinical Impressions(s) / ED Diagnoses   Final diagnoses:  Other headache syndrome  Generalized abdominal pain    New Prescriptions New Prescriptions   No medications on file     Ripley Fraise, MD 12/09/16 [phone]

## 2016-12-09 NOTE — ED Notes (Signed)
Patient vomited 100cc. The vomite was clear with brown chunks

## 2016-12-09 NOTE — Discharge Instructions (Signed)
Please read and follow all provided instructions.  Your diagnoses today include:  1. Pain of upper abdomen   2. Vomiting   3. Non-intractable vomiting with nausea, unspecified vomiting type     Tests performed today include:  Blood tests to check liver and pancreas function  Urine test to look for infection  Vital signs. See below for your results today.   Medications prescribed:   None  Take any prescribed medications only as directed.  Home care instructions:   Follow any educational materials contained in this packet.  Follow-up instructions: Please follow-up with your primary care provider in the next 7 days for further evaluation of your symptoms.    Return instructions:  SEEK IMMEDIATE MEDICAL ATTENTION IF:  The pain does not go away or becomes severe   A temperature above 101F develops   Repeated vomiting occurs (multiple episodes)   The pain becomes localized to portions of the abdomen. The right side could possibly be appendicitis. In an adult, the left lower portion of the abdomen could be colitis or diverticulitis.   Blood is being passed in stools or vomit (bright red or black tarry stools)   You develop chest pain, difficulty breathing, dizziness or fainting, or become confused, poorly responsive, or inconsolable (young children)  If you have any other emergent concerns regarding your health  Additional Information: Abdominal (belly) pain can be caused by many things. Your caregiver performed an examination and possibly ordered blood/urine tests and imaging (CT scan, x-rays, ultrasound). Many cases can be observed and treated at home after initial evaluation in the emergency department. Even though you are being discharged home, abdominal pain can be unpredictable. Therefore, you need a repeated exam if your pain does not resolve, returns, or worsens. Most patients with abdominal pain don't have to be admitted to the hospital or have surgery, but serious  problems like appendicitis and gallbladder attacks can start out as nonspecific pain. Many abdominal conditions cannot be diagnosed in one visit, so follow-up evaluations are very important.  Your vital signs today were: BP 97/76 (BP Location: Left Arm)    Pulse (!) 102    Temp 98.7 F (37.1 C) (Oral)    Resp 16    SpO2 100%  If your blood pressure (bp) was elevated above 135/85 this visit, please have this repeated by your doctor within one month. --------------

## 2016-12-09 NOTE — ED Notes (Signed)
Pt states was seen and d/c this am and she went to get in car and her stomach hurt to b AD , NOW  She is vomiting

## 2016-12-09 NOTE — ED Triage Notes (Signed)
Patient reports right facial pain and upper chest pain onset this evening , mild SOB with emesis . Denies diaphoresis.

## 2016-12-09 NOTE — ED Provider Notes (Signed)
[facility] DEPT Provider Note   CSN: [id number] Arrival date & time: 12/09/16  1024     History   Chief Complaint Chief Complaint  Patient presents with  . Abdominal Pain    HPI [name] is a 31 y.o. female.  Patient with history of kidney transplant, frequent emergency department visits, diabetes, urinary tract infection -- returns to the emergency department after being seen early this morning. Patient was complaining of a headache, which is now resolved. She returns because of persistent upper abdominal pain across the top of her abdomen. She has had nausea and vomiting associated with this. Symptoms have been ongoing for about 3 days. She has had symptoms similar to this in the past. Pain does not radiate. No associated fevers, chest pain, shortness of breath. No change in her stools. She is making urine normally without dysuria. The onset of this condition was acute. The course is constant. Aggravating factors: palpation. Alleviating factors: none.        Past Medical History:  Diagnosis Date  . Chronic kidney disease   . Diabetes mellitus    Pt reports diagnosis in June 2011  . Hyperlipidemia   . Hypertension   . Kidney transplant recipient   . UTI (urinary tract infection) 01/09/2015    Patient Active Problem List   Diagnosis Date Noted  . Diabetes ([place]) 05/25/2015  . Acute UTI   . Emesis   . UTI (urinary tract infection) 01/09/2015  . Abdominal pain 01/09/2015  . Nausea and vomiting 01/09/2015  . Acute on chronic kidney failure ([place]) 01/09/2015  . UTI (lower urinary tract infection) 01/09/2015  . Malnutrition of moderate degree 01/06/2015  . Renal failure (ARF), acute on chronic (HCC) 01/05/2015  . Fever, unspecified   . Renal transplant recipient   . Immunosuppressed status ([place])   . Hyponatremia   . Acute renal failure ([place]) 09/30/2014  . Sepsis ([place]) 06/24/2014  . Pyelonephritis 06/23/2014  . Dehydration 12/22/2012  . Chest pain 12/22/2012    . 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 CHRONIC KIDNEY DISEASE STAGE II (MILD) 01/04/2010  . OVARIAN FAILURE, PREMATURE 03/11/2009  . 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 SECONDARY HYPERPARATHYROIDISM 12/05/2007  . OBESITY 09/25/2007  . Anemia in chronic renal disease 09/25/2007  . LEARNING DISABILITY 09/25/2007  . Essential hypertension, benign 09/25/2007  . KIDNEY TRANSPLANTATION, HX OF 09/25/2007    Past Surgical History:  Procedure Laterality Date  . KIDNEY TRANSPLANT  2007  . RENAL BIOPSY Bilateral 2003  . THYROID LOBECTOMY      OB History    No data available       Home Medications    Prior to Admission medications   Medication Sig Start Date End Date Taking? Authorizing Provider  ACCU-CHEK SOFTCLIX LANCETS lancets Use to check blood sugar 4 times per day. 12/29/15   Renato Shin, MD  ACCU-CHEK SOFTCLIX LANCETS lancets USE TO CHECK BLOOD SUGAR FOUR TIMES DAILY AS DIRECTED 01/01/16   Renato Shin, MD  acetaminophen (TYLENOL) 500 MG tablet Take 1,000-1,500 mg by mouth every 6 (six) hours as needed for mild pain.     [provider]  calcium carbonate (TUMS) 500 MG chewable tablet Chew 2 tablets by mouth 3 (three) times daily as needed for indigestion. Takes on Monday, wednesdays, and fridays    [provider]  cetirizine (ZYRTEC) 10 MG tablet Take 10 mg by mouth daily.    [provider]  cyclobenzaprine (FLEXERIL) 10  MG tablet Take 1 tablet (10 mg total) by mouth 2 (two) times daily as needed for muscle spasms. 07/30/16   Montine Circle, PA-C  diazepam (VALIUM) 5 MG tablet Take 1 tablet (5 mg total) by mouth every 6 (six) hours as needed for anxiety (spasms). 05/11/15   Elnora Morrison, MD  famotidine (PEPCID) 20 MG tablet Take 1 tablet (20 mg total) by mouth daily. Patient taking differently: Take 20 mg by mouth See admin instructions. M W F 01/12/15   Nita Sells, MD  fluticasone  (FLONASE) 50 MCG/ACT nasal spray Place 2 sprays into both nostrils daily. 07/20/15   Muthersbaugh, Jarrett Soho, PA-C  glucose blood (BAYER CONTOUR NEXT TEST) test strip Check 4 times per day. DX CODE E11.9 06/13/15   Renato Shin, MD  glucose blood [facility] VERIO) test strip Check 4 times per day. DX CODE E11.9 12/19/15   Renato Shin, MD  guaiFENesin (MUCINEX) 600 MG 12 hr tablet Take 2 tablets (1,200 mg total) by mouth 2 (two) times daily. 07/20/15   Muthersbaugh, Jarrett Soho, PA-C  HYDROcodone-acetaminophen (NORCO/VICODIN) 5-325 MG tablet Take 1-2 tablets by mouth every 6 (six) hours as needed. 07/30/16   Montine Circle, PA-C  hydrOXYzine (ATARAX) 25 MG tablet Take 25 mg by mouth 2 (two) times daily as needed for itching.     [provider]  insulin degludec (TRESIBA FLEXTOUCH) 100 UNIT/ML SOPN FlexTouch Pen Inject 1.3 mLs (130 Units total) into the skin daily. And pen needles 1/day 07/22/16   Renato Shin, MD  lisinopril-hydrochlorothiazide (PRINZIDE,ZESTORETIC) 20-25 MG tablet Take 1 tablet by mouth daily as needed (blood pressure).  11/14/14   [provider]  mycophenolate (MYFORTIC) 360 MG TBEC Take 720 mg by mouth 2 (two) times daily.     [provider]  ondansetron (ZOFRAN) 4 MG tablet Take 1 tablet (4 mg total) by mouth every 8 (eight) hours as needed for nausea or vomiting. 08/10/16   Curatolo, Adam, DO  pantoprazole (PROTONIX) 20 MG tablet Take 1 tablet (20 mg total) by mouth daily. 01/12/15   Nita Sells, MD  predniSONE (DELTASONE) 5 MG tablet Take 5 mg by mouth at bedtime.     [provider]  promethazine (PHENERGAN) 12.5 MG tablet Take 1 tablet (12.5 mg total) by mouth every 6 (six) hours as needed for nausea or vomiting. 10/01/14   [name], Ripudeep K, MD  simvastatin (ZOCOR) 20 MG tablet Take 20 mg by mouth 3 (three) times a week. MWF.    [provider]  tacrolimus (PROGRAF) 1 MG capsule Take 2 mg by mouth 2 (two) times daily.     [provider]    Family History Family History  Problem Relation Age of Onset  . Arthritis Mother   . Diabetes Mother   . Hypertension Mother     Social History Social History  Substance Use Topics  . Smoking status: Never Smoker  . Smokeless tobacco: Never Used  . Alcohol use No     Allergies   Benadryl [diphenhydramine-zinc acetate]; Motrin [ibuprofen]; Banana; Diphenhydramine hcl; Doxycycline; Iron dextran; and Shellfish allergy   Review of Systems Review of Systems  Constitutional: Negative for fever.  HENT: Negative for rhinorrhea and sore throat.   Eyes: Negative for redness.  Respiratory: Negative for cough.   Cardiovascular: Negative for chest pain.  Gastrointestinal: Positive for abdominal pain and nausea. Negative for diarrhea and vomiting.  Genitourinary: Negative for dysuria, hematuria, vaginal bleeding and vaginal discharge.  Musculoskeletal: Negative for myalgias.  Skin: Negative  for rash.  Neurological: Negative for headaches (resolved).     Physical Exam Updated Vital Signs BP (!) 134/102 (BP Location: Left Arm)   Pulse 100   Temp 98.7 F (37.1 C) (Oral)   Resp (!) 24   SpO2 98%   Physical Exam  Constitutional: She appears well-developed and well-nourished. She appears distressed.  Patient crying and moaning during exam  HENT:  Head: Normocephalic and atraumatic.  Mouth/Throat: Oropharynx is clear and moist.  Eyes: Conjunctivae are normal. Right eye exhibits no discharge. Left eye exhibits no discharge.  Neck: Normal range of motion. Neck supple.  Cardiovascular: Normal rate, regular rhythm and normal heart sounds.   No murmur heard. Pulmonary/Chest: Effort normal and breath sounds normal. No respiratory distress. She has no wheezes. She has no rales.  Abdominal: Soft. There is tenderness (Moderate tenderness to palpation) in the right upper quadrant, epigastric area and left upper quadrant.    Multiple healed surgical scars over abdomen.    Neurological: She is alert.  Skin: Skin is warm and dry.  Psychiatric: She has a normal mood and affect.  Nursing note and vitals reviewed.    ED Treatments / Results  Labs (all labs ordered are listed, but only abnormal results are displayed) Labs Reviewed  URINALYSIS, ROUTINE W REFLEX MICROSCOPIC - Abnormal; Notable for the following:       Result Value   APPearance HAZY (*)    Glucose, UA 50 (*)    Protein, ur 30 (*)    Squamous Epithelial / LPF 0-5 (*)    All other components within normal limits  LIPASE, BLOOD  HEPATIC FUNCTION PANEL    Radiology Dg Chest 2 View  Result Date: 12/09/2016 CLINICAL DATA:  Chest pain. EXAM: CHEST  2 VIEW COMPARISON:  Radiograph 06/16/2016 FINDINGS: The cardiomediastinal contours are normal. Mild lung base atelectasis. Pulmonary vasculature is normal. No consolidation, pleural effusion, or pneumothorax. No acute osseous abnormalities are seen. IMPRESSION: Mild basilar atelectasis. Electronically Signed   By: Jeb Levering M.D.   On: 12/09/2016 01:06    Procedures Procedures (including critical care time)  Medications Ordered in ED Medications  ondansetron (ZOFRAN) injection 4 mg (4 mg Intravenous Given 12/09/16 1335)  fentaNYL (SUBLIMAZE) injection 100 mcg (100 mcg Intravenous Given 12/09/16 1335)     Initial Impression / Assessment and Plan / ED Course  I have reviewed the triage vital signs and the nursing notes.  Pertinent labs & imaging results that were available during my care of the patient were reviewed by me and considered in my medical decision making (see chart for details).     Patient seen and examined. Previous workup reviewed. Out at hepatic function panel and lipase. Will check urine studies. Will give pain medication and nausea medication and recheck. Medications ordered.   Vital signs reviewed and are as follows: BP (!) 134/102 (BP Location: Left Arm)   Pulse 100   Temp 98.7 F (37.1 C) (Oral)   Resp (!) 24    SpO2 98%   4:59 PM Patient reassessed. No further vomiting. Abdomen is soft and nontender. Will discharge to home. She states that she has a friend home to use. Discussed bland diet.  The patient was urged to return to the Emergency Department immediately with worsening of current symptoms, worsening abdominal pain, persistent vomiting, blood noted in stools, fever, or any other concerns. The patient verbalized understanding.    Final Clinical Impressions(s) / ED Diagnoses   Final diagnoses:  Pain of upper abdomen  Non-intractable vomiting with nausea, unspecified vomiting type   Patient with abdominal pain. Vitals are stable, no fever. Labs reassuring. Imaging without signs of obstruction. No signs of dehydration, patient is tolerating PO's. Lungs are clear and no signs suggestive of PNA. Low concern for appendicitis, cholecystitis, pancreatitis, ruptured viscus, UTI, kidney stone, aortic dissection, aortic aneurysm or other emergent abdominal etiology. Supportive therapy indicated with return if symptoms worsen.    New Prescriptions New Prescriptions   No medications on file     Carlisle Cater, Hershal Coria 12/09/16 1705    Carmin Muskrat, MD 12/09/16 1721

## 2016-12-09 NOTE — ED Triage Notes (Addendum)
Pt reports she was just d/c at 0800. Reports she is still having pain that started again right when she got to car. States she is still nauseated. PT just had labs collected so will not initiate standing orders. She is tearful and holding emesis bag which is empty. Last BM was yesterday

## 2016-12-10 ENCOUNTER — Emergency Department (HOSPITAL_COMMUNITY)
Admission: EM | Admit: 2016-12-10 | Discharge: 2016-12-10 | Disposition: A | Discharge status: Home / Self Care | Payer: Medicare Other | Source: Home / Self Care

## 2016-12-10 ENCOUNTER — Encounter (HOSPITAL_COMMUNITY): Payer: Self-pay | Admitting: Emergency Medicine

## 2016-12-10 ENCOUNTER — Emergency Department (HOSPITAL_COMMUNITY): Payer: Medicare Other

## 2016-12-10 ENCOUNTER — Encounter (HOSPITAL_COMMUNITY): Payer: Self-pay

## 2016-12-10 ENCOUNTER — Emergency Department (HOSPITAL_COMMUNITY)
Admission: EM | Admit: 2016-12-10 | Discharge: 2016-12-10 | Disposition: A | Discharge status: Home / Self Care | Payer: Medicare Other | Source: Home / Self Care | Attending: Emergency Medicine | Admitting: Emergency Medicine

## 2016-12-10 DIAGNOSIS — I129 Hypertensive chronic kidney disease with stage 1 through stage 4 chronic kidney disease, or unspecified chronic kidney disease: Secondary | ICD-10-CM

## 2016-12-10 DIAGNOSIS — R739 Hyperglycemia, unspecified: Secondary | ICD-10-CM

## 2016-12-10 DIAGNOSIS — Z940 Kidney transplant status: Secondary | ICD-10-CM | POA: Insufficient documentation

## 2016-12-10 DIAGNOSIS — N189 Chronic kidney disease, unspecified: Secondary | ICD-10-CM | POA: Insufficient documentation

## 2016-12-10 DIAGNOSIS — R112 Nausea with vomiting, unspecified: Secondary | ICD-10-CM

## 2016-12-10 DIAGNOSIS — R1013 Epigastric pain: Secondary | ICD-10-CM

## 2016-12-10 DIAGNOSIS — E785 Hyperlipidemia, unspecified: Secondary | ICD-10-CM | POA: Insufficient documentation

## 2016-12-10 DIAGNOSIS — R109 Unspecified abdominal pain: Secondary | ICD-10-CM | POA: Insufficient documentation

## 2016-12-10 DIAGNOSIS — K2970 Gastritis, unspecified, without bleeding: Secondary | ICD-10-CM | POA: Diagnosis not present

## 2016-12-10 DIAGNOSIS — Z79899 Other long term (current) drug therapy: Secondary | ICD-10-CM | POA: Insufficient documentation

## 2016-12-10 DIAGNOSIS — E1165 Type 2 diabetes mellitus with hyperglycemia: Secondary | ICD-10-CM

## 2016-12-10 DIAGNOSIS — Z794 Long term (current) use of insulin: Secondary | ICD-10-CM | POA: Diagnosis not present

## 2016-12-10 DIAGNOSIS — Z5321 Procedure and treatment not carried out due to patient leaving prior to being seen by health care provider: Secondary | ICD-10-CM | POA: Insufficient documentation

## 2016-12-10 LAB — COMPREHENSIVE METABOLIC PANEL
ALT: 15 U/L (ref 14–54)
AST: 17 U/L (ref 15–41)
Albumin: 3.7 g/dL (ref 3.5–5.0)
Alkaline Phosphatase: 73 U/L (ref 38–126)
Anion gap: 9 (ref 5–15)
BILIRUBIN TOTAL: 0.8 mg/dL (ref 0.3–1.2)
BUN: 19 mg/dL (ref 6–20)
CHLORIDE: 100 mmol/L — AB (ref 101–111)
CO2: 29 mmol/L (ref 22–32)
CREATININE: 1.39 mg/dL — AB (ref 0.44–1.00)
Calcium: 7.9 mg/dL — ABNORMAL LOW (ref 8.9–10.3)
GFR, EST AFRICAN AMERICAN: 57 mL/min — AB (ref >60)
GFR, EST NON AFRICAN AMERICAN: 49 mL/min — AB (ref >60)
Glucose, Bld: 282 mg/dL — ABNORMAL HIGH (ref 65–99)
POTASSIUM: 3.6 mmol/L (ref 3.5–5.1)
Sodium: 138 mmol/L (ref 135–145)
TOTAL PROTEIN: 7.3 g/dL (ref 6.5–8.1)

## 2016-12-10 LAB — URINALYSIS, ROUTINE W REFLEX MICROSCOPIC
BILIRUBIN URINE: NEGATIVE
Bacteria, UA: NONE SEEN
GLUCOSE, UA: 150 mg/dL — AB
HGB URINE DIPSTICK: NEGATIVE
KETONES UR: NEGATIVE mg/dL
LEUKOCYTES UA: NEGATIVE
NITRITE: NEGATIVE
PH: 6 (ref 5.0–8.0)
PROTEIN: 100 mg/dL — AB
Specific Gravity, Urine: 1.014 (ref 1.005–1.030)

## 2016-12-10 LAB — PREGNANCY, URINE: Preg Test, Ur: NEGATIVE

## 2016-12-10 LAB — LIPASE, BLOOD: LIPASE: 27 U/L (ref 11–51)

## 2016-12-10 LAB — CBC
HCT: 34.3 % — ABNORMAL LOW (ref 36.0–46.0)
Hemoglobin: 10.7 g/dL — ABNORMAL LOW (ref 12.0–15.0)
MCH: 27.6 pg (ref 26.0–34.0)
MCHC: 31.2 g/dL (ref 30.0–36.0)
MCV: 88.6 fL (ref 78.0–100.0)
PLATELETS: 230 10*3/uL (ref 150–400)
RBC: 3.87 MIL/uL (ref 3.87–5.11)
RDW: 14.8 % (ref 11.5–15.5)
WBC: 7.7 10*3/uL (ref 4.0–10.5)

## 2016-12-10 MED ORDER — ONDANSETRON HCL 4 MG/2ML IJ SOLN
4.0000 mg | Freq: Once | INTRAMUSCULAR | Status: AC
  Administered 2016-12-10: 4 mg via INTRAVENOUS
  Filled 2016-12-10: qty 2

## 2016-12-10 MED ORDER — HYDROCODONE-ACETAMINOPHEN 5-325 MG PO TABS
1.0000 | ORAL_TABLET | Freq: Once | ORAL | Status: AC
  Administered 2016-12-10: 1 via ORAL
  Filled 2016-12-10: qty 1

## 2016-12-10 MED ORDER — HYDROCODONE-ACETAMINOPHEN 5-325 MG PO TABS: 1.0000 | ORAL_TABLET | ORAL | 0 refills | Status: DC | PRN

## 2016-12-10 MED ORDER — MORPHINE SULFATE (PF) 4 MG/ML IV SOLN
4.0000 mg | Freq: Once | INTRAVENOUS | Status: AC
  Administered 2016-12-10: 4 mg via INTRAVENOUS
  Filled 2016-12-10: qty 1

## 2016-12-10 MED ORDER — METOCLOPRAMIDE HCL 10 MG PO TABS: 10.0000 mg | ORAL_TABLET | Freq: Four times a day (QID) | ORAL | 0 refills | Status: DC

## 2016-12-10 MED ORDER — METOCLOPRAMIDE HCL 10 MG PO TABS
10.0000 mg | ORAL_TABLET | Freq: Once | ORAL | Status: AC
  Administered 2016-12-10: 10 mg via ORAL
  Filled 2016-12-10: qty 1

## 2016-12-10 MED ORDER — SODIUM CHLORIDE 0.9 % IV BOLUS (SEPSIS)
1000.0000 mL | Freq: Once | INTRAVENOUS | Status: AC
  Administered 2016-12-10: 1000 mL via INTRAVENOUS

## 2016-12-10 NOTE — ED Notes (Signed)
Patient transported to CT 

## 2016-12-10 NOTE — ED Triage Notes (Signed)
Patient called EMS for abdominal pain. Patient has been seen 5 times in the last 24 hours. Patient walked to stretcher for EMS. EMS gave 4mg  of odt zofran. Patient is crying and tossing.

## 2016-12-10 NOTE — ED Triage Notes (Signed)
Pt here for abd pain onset yesterday, sts seen for same yesterday and emesis.

## 2016-12-10 NOTE — ED Notes (Signed)
Pt given water and instructed to take her home antirejection medications. Pt informed of need to notify RN if she is unable to keep medications down.

## 2016-12-10 NOTE — ED Provider Notes (Signed)
[facility] DEPT Provider Note   CSN: [id number] Arrival date & time: 12/10/16  0534     History   Chief Complaint Chief Complaint  Patient presents with  . Abdominal Pain    HPI  [name] is a 31 y.o. female with a history of renal transplant, and frequent emergency department visits, diabetes, returns to the emergency department after being seen yesterday. Patient continuing to complain of persistent upper abdominal pain primarily in the epigastrium, with nausea and vomiting. Patient reports symptoms have been ongoing for 4 days. She reports no change in her symptoms today, they just continue to persist. Patient's vomiting has been persistent she's been unable to keep down food or fluids, and her main concern is that she's been unable to keep down her antirejection medications for her kidney transplant. She reports 1 fever of 100 early on when symptoms first started, no fever since then. Denies chest pain or urinary symptoms. Patient has had a chest x-ray and an abdominal x-ray so far for evaluation of this pain, no CT scans since July.      Past Medical History:  Diagnosis Date  . Chronic kidney disease   . Diabetes mellitus    Pt reports diagnosis in June 2011  . Hyperlipidemia   . Hypertension   . Kidney transplant recipient   . UTI (urinary tract infection) 01/09/2015    Patient Active Problem List   Diagnosis Date Noted  . Diabetes ([place]) 05/25/2015  . Acute UTI   . Emesis   . UTI (urinary tract infection) 01/09/2015  . Abdominal pain 01/09/2015  . Nausea and vomiting 01/09/2015  . Acute on chronic kidney failure ([place]) 01/09/2015  . UTI (lower urinary tract infection) 01/09/2015  . Malnutrition of moderate degree 01/06/2015  . Renal failure (ARF), acute on chronic (HCC) 01/05/2015  . Fever, unspecified   . Renal transplant recipient   . Immunosuppressed status ([place])   . Hyponatremia   . Acute renal failure ([place]) 09/30/2014  . Sepsis ([place]) 06/24/2014    . Pyelonephritis 06/23/2014  . Dehydration 12/22/2012  . Chest pain 12/22/2012  . 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 CHRONIC KIDNEY DISEASE STAGE II (MILD) 01/04/2010  . OVARIAN FAILURE, PREMATURE 03/11/2009  . 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 SECONDARY HYPERPARATHYROIDISM 12/05/2007  . OBESITY 09/25/2007  . Anemia in chronic renal disease 09/25/2007  . LEARNING DISABILITY 09/25/2007  . Essential hypertension, benign 09/25/2007  . KIDNEY TRANSPLANTATION, HX OF 09/25/2007    Past Surgical History:  Procedure Laterality Date  . KIDNEY TRANSPLANT  2007  . RENAL BIOPSY Bilateral 2003  . THYROID LOBECTOMY      OB History    No data available       Home Medications    Prior to Admission medications   Medication Sig Start Date End Date Taking? Authorizing Provider  ACCU-CHEK SOFTCLIX LANCETS lancets Use to check blood sugar 4 times per day. 12/29/15   Renato Shin, MD  ACCU-CHEK SOFTCLIX LANCETS lancets USE TO CHECK BLOOD SUGAR FOUR TIMES DAILY AS DIRECTED 01/01/16   Renato Shin, MD  acetaminophen (TYLENOL) 500 MG tablet Take 1,000-1,500 mg by mouth every 6 (six) hours as needed for mild pain.     [provider]  calcium carbonate (TUMS) 500 MG chewable tablet Chew 2 tablets by mouth 3 (three) times daily as needed for indigestion. Takes on Monday, wednesdays, and fridays    [provider]  cetirizine (ZYRTEC) 10 MG  tablet Take 10 mg by mouth daily.    [provider]  cyclobenzaprine (FLEXERIL) 10 MG tablet Take 1 tablet (10 mg total) by mouth 2 (two) times daily as needed for muscle spasms. 07/30/16   Montine Circle, PA-C  diazepam (VALIUM) 5 MG tablet Take 1 tablet (5 mg total) by mouth every 6 (six) hours as needed for anxiety (spasms). 05/11/15   Elnora Morrison, MD  famotidine (PEPCID) 20 MG tablet Take 1 tablet (20 mg total) by mouth daily. Patient taking differently: Take 20 mg by  mouth See admin instructions. M W F 01/12/15   Nita Sells, MD  fluticasone (FLONASE) 50 MCG/ACT nasal spray Place 2 sprays into both nostrils daily. 07/20/15   Muthersbaugh, Jarrett Soho, PA-C  glucose blood (BAYER CONTOUR NEXT TEST) test strip Check 4 times per day. DX CODE E11.9 06/13/15   Renato Shin, MD  glucose blood [facility] VERIO) test strip Check 4 times per day. DX CODE E11.9 12/19/15   Renato Shin, MD  guaiFENesin (MUCINEX) 600 MG 12 hr tablet Take 2 tablets (1,200 mg total) by mouth 2 (two) times daily. 07/20/15   Muthersbaugh, Jarrett Soho, PA-C  HYDROcodone-acetaminophen (NORCO/VICODIN) 5-325 MG tablet Take 1-2 tablets by mouth every 6 (six) hours as needed. 07/30/16   Montine Circle, PA-C  hydrOXYzine (ATARAX) 25 MG tablet Take 25 mg by mouth 2 (two) times daily as needed for itching.     [provider]  insulin degludec (TRESIBA FLEXTOUCH) 100 UNIT/ML SOPN FlexTouch Pen Inject 1.3 mLs (130 Units total) into the skin daily. And pen needles 1/day 07/22/16   Renato Shin, MD  lisinopril-hydrochlorothiazide (PRINZIDE,ZESTORETIC) 20-25 MG tablet Take 1 tablet by mouth daily as needed (blood pressure).  11/14/14   [provider]  mycophenolate (MYFORTIC) 360 MG TBEC Take 720 mg by mouth 2 (two) times daily.     [provider]  ondansetron (ZOFRAN) 4 MG tablet Take 1 tablet (4 mg total) by mouth every 8 (eight) hours as needed for nausea or vomiting. 08/10/16   Curatolo, Adam, DO  pantoprazole (PROTONIX) 20 MG tablet Take 1 tablet (20 mg total) by mouth daily. 01/12/15   Nita Sells, MD  predniSONE (DELTASONE) 5 MG tablet Take 5 mg by mouth at bedtime.     [provider]  promethazine (PHENERGAN) 12.5 MG tablet Take 1 tablet (12.5 mg total) by mouth every 6 (six) hours as needed for nausea or vomiting. 10/01/14   [name], Ripudeep K, MD  simvastatin (ZOCOR) 20 MG tablet Take 20 mg by mouth 3 (three) times a week. MWF.    [provider]   tacrolimus (PROGRAF) 1 MG capsule Take 2 mg by mouth 2 (two) times daily.     [provider]    Family History Family History  Problem Relation Age of Onset  . Arthritis Mother   . Diabetes Mother   . Hypertension Mother     Social History Social History  Substance Use Topics  . Smoking status: Never Smoker  . Smokeless tobacco: Never Used  . Alcohol use No     Allergies   Benadryl [diphenhydramine-zinc acetate]; Motrin [ibuprofen]; Banana; Diphenhydramine hcl; Doxycycline; Iron dextran; and Shellfish allergy   Review of Systems Review of Systems  Constitutional: Negative for chills and fever.  HENT: Negative for congestion, rhinorrhea and sore throat.   Respiratory: Negative for cough, chest tightness and shortness of breath.   Cardiovascular: Negative for chest pain and palpitations.  Gastrointestinal: Positive for abdominal pain, diarrhea, nausea and  vomiting.  Genitourinary: Negative for difficulty urinating and dysuria.  Musculoskeletal: Negative for back pain.  Skin: Negative for pallor and rash.  Neurological: Positive for headaches. Negative for dizziness, weakness, light-headedness and numbness.     Physical Exam Updated Vital Signs BP (!) 153/112   Pulse (!) 102   Temp 97.7 F (36.5 C) (Oral)   Resp 20   SpO2 97%   Physical Exam  Constitutional: She appears well-developed and well-nourished. No distress.  HENT:  Head: Normocephalic and atraumatic.  Eyes: Right eye exhibits no discharge. Left eye exhibits no discharge.  Pulmonary/Chest: Effort normal. No respiratory distress.  Abdominal: Soft. Bowel sounds are normal. There is tenderness.  Abdomen is soft, large surgical scar noted, tenderness to palpation primarily at the epigastrium, some tenderness in the left upper quadrant, with some voluntary guarding. Nontender to palpation in all other quadrants. Negative Murphy sign, nontender McBurney's point, no CVA tenderness  Musculoskeletal:  She exhibits no edema or deformity.  Neurological: She is alert. Coordination normal.  Skin: Skin is warm and dry. Capillary refill takes less than 2 seconds. She is not diaphoretic.  Psychiatric: She has a normal mood and affect. Her behavior is normal.  Nursing note and vitals reviewed.    ED Treatments / Results  Labs (all labs ordered are listed, but only abnormal results are displayed) Labs Reviewed  COMPREHENSIVE METABOLIC PANEL - Abnormal; Notable for the following:       Result Value   Chloride 100 (*)    Glucose, Bld 282 (*)    Creatinine, Ser 1.39 (*)    Calcium 7.9 (*)    GFR calc non Af Amer 49 (*)    GFR calc Af Amer 57 (*)    All other components within normal limits  CBC - Abnormal; Notable for the following:    Hemoglobin 10.7 (*)    HCT 34.3 (*)    All other components within normal limits  URINALYSIS, ROUTINE W REFLEX MICROSCOPIC - Abnormal; Notable for the following:    Glucose, UA 150 (*)    Protein, ur 100 (*)    Squamous Epithelial / LPF 0-5 (*)    All other components within normal limits  LIPASE, BLOOD    EKG  EKG Interpretation None       Radiology Dg Chest 2 View  Result Date: 12/09/2016 CLINICAL DATA:  Chest pain. EXAM: CHEST  2 VIEW COMPARISON:  Radiograph 06/16/2016 FINDINGS: The cardiomediastinal contours are normal. Mild lung base atelectasis. Pulmonary vasculature is normal. No consolidation, pleural effusion, or pneumothorax. No acute osseous abnormalities are seen. IMPRESSION: Mild basilar atelectasis. Electronically Signed   By: Jeb Levering M.D.   On: 12/09/2016 01:06   Dg Abd 2 Views  Result Date: 12/09/2016 CLINICAL DATA:  Several days of abdominal pain. Nauseous lately. No bowel habit change. History of renal transplant on the right EXAM: ABDOMEN - 2 VIEW COMPARISON:  CT scan of the abdomen and pelvis of October 01, 2016 FINDINGS: The bowel gas pattern is normal. There is no free extraluminal gas. There are no abnormal soft tissue  calcifications. There surgical clips projecting over the right sacral body. No acute bony abnormality is observed. IMPRESSION: No acute intra-abdominal abnormality is observed. Electronically Signed   By: David  Martinique M.D.   On: 12/09/2016 14:10    Procedures Procedures (including critical care time)  Medications Ordered in ED Medications  ondansetron (ZOFRAN) injection 4 mg (not administered)  morphine 4 MG/ML injection 4 mg (not administered)  sodium chloride 0.9 % bolus 1,000 mL (not administered)     Initial Impression / Assessment and Plan / ED Course  I have reviewed the triage vital signs and the nursing notes.  Pertinent labs & imaging results that were available during my care of the patient were reviewed by me and considered in my medical decision making (see chart for details).  Pt presents with persistent abdominal pain, nausea and vomiting. Patient is tachycardic and hypertensive on initial eval likely due to pain and/or dehydration, vitals improved with pain medication and fluids. Labs unremarkable with no significant change from yesterday. Given that this is the patient's third visit for this abdominal pain and nausea will order a noncontrast abdominal CT scan. Pain and nausea improved with medication in the ED.  CT scan without acute intra-abdominal abnormalities, transplant kidney stable compared to previous CT scan. Patient's diabetes has been poorly controlled, her CBG is 282 today and she says it is often this time home as well despite using her insulin as directed. Patient has not been in to see endocrinologist in a while, has appointment on October 10 with new endocrinologist. 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  Patient tolerated fluids and home meds. One additional dose of pain medication prior to discharge, patient will  be discharged home encourage her to schedule follow-up with primary care doctor as soon as possible. She'll be discharged home with prescriptions for Reglan and a few Norco as patient is unable to take NSAIDs with kidney transplant. Return precautions provided, patient expresses understanding and is in agreement with plan.  Patient discussed with Dr. Brantley Stage, who saw patient as well and agrees with plan.    Final Clinical Impressions(s) / ED Diagnoses   Final diagnoses:  Epigastric pain  Non-intractable vomiting with nausea, unspecified vomiting type  Hyperglycemia    New Prescriptions New Prescriptions   HYDROCODONE-ACETAMINOPHEN (NORCO) 5-325 MG TABLET    Take 1 tablet by mouth every 4 (four) hours as needed.   METOCLOPRAMIDE (REGLAN) 10 MG TABLET    Take 1 tablet (10 mg total) by mouth every 6 (six) hours.     Jacqlyn Larsen, PA-C 12/10/16 1731    Forde Dandy, MD 12/16/16 [id number]

## 2016-12-10 NOTE — Discharge Instructions (Signed)
Your workup today is reassuring, abdominal CT showed a her kidney transplant is stable and there were no acute abdominal findings. You can use Reglan to treat your nausea at home. Your blood sugar was high today, please follow-up with the new endocrinologist in October as we discussed, follow-up with your primary doctor as well. Please return to the emergency department if your abdominal pain worsens, vomiting persists and you're unable to keep down fluids or food, you developed fevers or chills or other new or concerning symptoms develop.

## 2016-12-10 NOTE — ED Provider Notes (Signed)
Medical screening examination/treatment/procedure(s) were conducted as a shared visit with non-physician practitioner(s) and myself.  I personally evaluated the patient during the encounter.   EKG Interpretation None      31 year old female with history of renal transplant who presents with nausea, vomiting, and abdominal pain. This is her 3rd ED visit over the last 24 hours. Reports subjective fever and chills. No urinary symptoms.   With vomiting in ED. Vital signs non-concerning, she is afebrile. Abdomen soft and non-peritoneal. With diffuse tenderness. Given multiple ED visits prior with persistent symptoms, CT abd/pelvis obtained to rule out acute intraabdominal processes such as appendicitis, colitis, diverticulitis. This is visualized. No other intraabdominal processes. UA  And blood work reassuring and baseline.   Given antiemetics, analgesics, and IVF. Feels improved. Tolerating PO without difficulty. Appropriate for discharge home with PCP follow-up.   Forde Dandy, MD 12/10/16 1524

## 2016-12-11 ENCOUNTER — Inpatient Hospital Stay (HOSPITAL_COMMUNITY)
Admission: EM | Admit: 2016-12-11 | Discharge: 2016-12-17 | DRG: 073 | Disposition: A | Discharge status: Home / Self Care | Payer: Medicare Other | Attending: Family Medicine | Admitting: Family Medicine

## 2016-12-11 ENCOUNTER — Other Ambulatory Visit: Payer: Self-pay

## 2016-12-11 ENCOUNTER — Encounter (HOSPITAL_COMMUNITY): Payer: Self-pay | Admitting: Emergency Medicine

## 2016-12-11 DIAGNOSIS — Z91013 Allergy to seafood: Secondary | ICD-10-CM

## 2016-12-11 DIAGNOSIS — N186 End stage renal disease: Secondary | ICD-10-CM | POA: Diagnosis present

## 2016-12-11 DIAGNOSIS — Z833 Family history of diabetes mellitus: Secondary | ICD-10-CM

## 2016-12-11 DIAGNOSIS — N185 Chronic kidney disease, stage 5: Secondary | ICD-10-CM

## 2016-12-11 DIAGNOSIS — E1022 Type 1 diabetes mellitus with diabetic chronic kidney disease: Secondary | ICD-10-CM | POA: Diagnosis not present

## 2016-12-11 DIAGNOSIS — R112 Nausea with vomiting, unspecified: Secondary | ICD-10-CM | POA: Diagnosis present

## 2016-12-11 DIAGNOSIS — E876 Hypokalemia: Secondary | ICD-10-CM | POA: Diagnosis present

## 2016-12-11 DIAGNOSIS — Z6827 Body mass index (BMI) 27.0-27.9, adult: Secondary | ICD-10-CM

## 2016-12-11 DIAGNOSIS — Z992 Dependence on renal dialysis: Secondary | ICD-10-CM

## 2016-12-11 DIAGNOSIS — R109 Unspecified abdominal pain: Secondary | ICD-10-CM | POA: Diagnosis present

## 2016-12-11 DIAGNOSIS — E1043 Type 1 diabetes mellitus with diabetic autonomic (poly)neuropathy: Principal | ICD-10-CM | POA: Diagnosis present

## 2016-12-11 DIAGNOSIS — N189 Chronic kidney disease, unspecified: Secondary | ICD-10-CM | POA: Diagnosis present

## 2016-12-11 DIAGNOSIS — R1010 Upper abdominal pain, unspecified: Secondary | ICD-10-CM | POA: Diagnosis not present

## 2016-12-11 DIAGNOSIS — N182 Chronic kidney disease, stage 2 (mild): Secondary | ICD-10-CM

## 2016-12-11 DIAGNOSIS — IMO0002 Reserved for concepts with insufficient information to code with codable children: Secondary | ICD-10-CM

## 2016-12-11 DIAGNOSIS — Z9119 Patient's noncompliance with other medical treatment and regimen: Secondary | ICD-10-CM

## 2016-12-11 DIAGNOSIS — G43A1 Cyclical vomiting, intractable: Secondary | ICD-10-CM | POA: Diagnosis not present

## 2016-12-11 DIAGNOSIS — E663 Overweight: Secondary | ICD-10-CM | POA: Diagnosis present

## 2016-12-11 DIAGNOSIS — Z79899 Other long term (current) drug therapy: Secondary | ICD-10-CM

## 2016-12-11 DIAGNOSIS — I10 Essential (primary) hypertension: Secondary | ICD-10-CM | POA: Diagnosis not present

## 2016-12-11 DIAGNOSIS — Z794 Long term (current) use of insulin: Secondary | ICD-10-CM

## 2016-12-11 DIAGNOSIS — Z91018 Allergy to other foods: Secondary | ICD-10-CM

## 2016-12-11 DIAGNOSIS — R110 Nausea: Secondary | ICD-10-CM | POA: Diagnosis not present

## 2016-12-11 DIAGNOSIS — Z940 Kidney transplant status: Secondary | ICD-10-CM | POA: Diagnosis not present

## 2016-12-11 DIAGNOSIS — R100 Acute abdomen: Secondary | ICD-10-CM | POA: Diagnosis not present

## 2016-12-11 DIAGNOSIS — K3184 Gastroparesis: Secondary | ICD-10-CM | POA: Diagnosis present

## 2016-12-11 DIAGNOSIS — E108 Type 1 diabetes mellitus with unspecified complications: Secondary | ICD-10-CM

## 2016-12-11 DIAGNOSIS — Z888 Allergy status to other drugs, medicaments and biological substances status: Secondary | ICD-10-CM

## 2016-12-11 DIAGNOSIS — E109 Type 1 diabetes mellitus without complications: Secondary | ICD-10-CM

## 2016-12-11 DIAGNOSIS — E1065 Type 1 diabetes mellitus with hyperglycemia: Secondary | ICD-10-CM | POA: Diagnosis present

## 2016-12-11 DIAGNOSIS — N179 Acute kidney failure, unspecified: Secondary | ICD-10-CM

## 2016-12-11 DIAGNOSIS — I120 Hypertensive chronic kidney disease with stage 5 chronic kidney disease or end stage renal disease: Secondary | ICD-10-CM | POA: Diagnosis present

## 2016-12-11 DIAGNOSIS — R1115 Cyclical vomiting syndrome unrelated to migraine: Secondary | ICD-10-CM

## 2016-12-11 LAB — COMPREHENSIVE METABOLIC PANEL
ALBUMIN: 3.6 g/dL (ref 3.5–5.0)
ALK PHOS: 65 U/L (ref 38–126)
ALT: 14 U/L (ref 14–54)
ANION GAP: 13 (ref 5–15)
AST: 21 U/L (ref 15–41)
BUN: 18 mg/dL (ref 6–20)
CHLORIDE: 100 mmol/L — AB (ref 101–111)
CO2: 25 mmol/L (ref 22–32)
CREATININE: 1.61 mg/dL — AB (ref 0.44–1.00)
Calcium: 7.7 mg/dL — ABNORMAL LOW (ref 8.9–10.3)
GFR calc non Af Amer: 41 mL/min — ABNORMAL LOW (ref >60)
GFR, EST AFRICAN AMERICAN: 48 mL/min — AB (ref >60)
GLUCOSE: 151 mg/dL — AB (ref 65–99)
Potassium: 3 mmol/L — ABNORMAL LOW (ref 3.5–5.1)
SODIUM: 138 mmol/L (ref 135–145)
Total Bilirubin: 1 mg/dL (ref 0.3–1.2)
Total Protein: 6.8 g/dL (ref 6.5–8.1)

## 2016-12-11 LAB — CBC
HCT: 36.7 % (ref 36.0–46.0)
HEMOGLOBIN: 11.5 g/dL — AB (ref 12.0–15.0)
MCH: 27.7 pg (ref 26.0–34.0)
MCHC: 31.3 g/dL (ref 30.0–36.0)
MCV: 88.4 fL (ref 78.0–100.0)
Platelets: 239 10*3/uL (ref 150–400)
RBC: 4.15 MIL/uL (ref 3.87–5.11)
RDW: 14.4 % (ref 11.5–15.5)
WBC: 10.3 10*3/uL (ref 4.0–10.5)

## 2016-12-11 LAB — CBG MONITORING, ED: Glucose-Capillary: 151 mg/dL — ABNORMAL HIGH (ref 65–99)

## 2016-12-11 LAB — LIPASE, BLOOD: LIPASE: 39 U/L (ref 11–51)

## 2016-12-11 MED ORDER — PROMETHAZINE HCL 25 MG/ML IJ SOLN: 25.0000 mg | Freq: Four times a day (QID) | INTRAMUSCULAR | Status: DC | PRN

## 2016-12-11 MED ORDER — HEPARIN SODIUM (PORCINE) 5000 UNIT/ML IJ SOLN
5000.0000 [IU] | Freq: Three times a day (TID) | INTRAMUSCULAR | Status: DC
  Administered 2016-12-12 – 2016-12-17 (×16): 5000 [IU] via SUBCUTANEOUS
  Filled 2016-12-11 (×16): qty 1

## 2016-12-11 MED ORDER — CYCLOBENZAPRINE HCL 10 MG PO TABS: 10.0000 mg | ORAL_TABLET | Freq: Two times a day (BID) | ORAL | Status: DC | PRN

## 2016-12-11 MED ORDER — METOCLOPRAMIDE HCL 5 MG/ML IJ SOLN
10.0000 mg | Freq: Three times a day (TID) | INTRAMUSCULAR | Status: DC
  Administered 2016-12-12 – 2016-12-14 (×8): 10 mg via INTRAVENOUS
  Filled 2016-12-11 (×8): qty 2

## 2016-12-11 MED ORDER — POTASSIUM CHLORIDE IN NACL 20-0.9 MEQ/L-% IV SOLN
INTRAVENOUS | Status: AC
  Administered 2016-12-12: 02:00:00 via INTRAVENOUS
  Filled 2016-12-11 (×2): qty 1000

## 2016-12-11 MED ORDER — FENTANYL CITRATE (PF) 100 MCG/2ML IJ SOLN
50.0000 ug | INTRAMUSCULAR | Status: DC | PRN
  Administered 2016-12-11: 50 ug via NASAL

## 2016-12-11 MED ORDER — CALCIUM CARBONATE ANTACID 500 MG PO CHEW
2.0000 | CHEWABLE_TABLET | Freq: Three times a day (TID) | ORAL | Status: DC | PRN
  Filled 2016-12-11: qty 2

## 2016-12-11 MED ORDER — PANTOPRAZOLE SODIUM 20 MG PO TBEC
20.0000 mg | DELAYED_RELEASE_TABLET | Freq: Every day | ORAL | Status: DC
  Administered 2016-12-12: 20 mg via ORAL
  Filled 2016-12-11 (×2): qty 1

## 2016-12-11 MED ORDER — ONDANSETRON 4 MG PO TBDP
4.0000 mg | ORAL_TABLET | Freq: Once | ORAL | Status: AC | PRN
  Administered 2016-12-11: 4 mg via ORAL

## 2016-12-11 MED ORDER — ONDANSETRON HCL 4 MG/2ML IJ SOLN
4.0000 mg | Freq: Four times a day (QID) | INTRAMUSCULAR | Status: DC | PRN
  Administered 2016-12-12 – 2016-12-17 (×8): 4 mg via INTRAVENOUS
  Filled 2016-12-11 (×10): qty 2

## 2016-12-11 MED ORDER — SODIUM CHLORIDE 0.9 % IV BOLUS (SEPSIS)
1000.0000 mL | Freq: Once | INTRAVENOUS | Status: AC
  Administered 2016-12-11: 1000 mL via INTRAVENOUS

## 2016-12-11 MED ORDER — ONDANSETRON 4 MG PO TBDP
ORAL_TABLET | ORAL | Status: AC
  Filled 2016-12-11: qty 1

## 2016-12-11 MED ORDER — POTASSIUM CHLORIDE CRYS ER 20 MEQ PO TBCR
20.0000 meq | EXTENDED_RELEASE_TABLET | Freq: Once | ORAL | Status: AC
  Administered 2016-12-12: 20 meq via ORAL
  Filled 2016-12-11: qty 1

## 2016-12-11 MED ORDER — HYDRALAZINE HCL 20 MG/ML IJ SOLN
10.0000 mg | INTRAMUSCULAR | Status: DC | PRN
  Administered 2016-12-16: 10 mg via INTRAVENOUS
  Filled 2016-12-11: qty 1

## 2016-12-11 MED ORDER — METOCLOPRAMIDE HCL 5 MG/ML IJ SOLN
10.0000 mg | Freq: Once | INTRAMUSCULAR | Status: AC
  Administered 2016-12-11: 10 mg via INTRAVENOUS
  Filled 2016-12-11: qty 2

## 2016-12-11 MED ORDER — SIMVASTATIN 20 MG PO TABS
20.0000 mg | ORAL_TABLET | ORAL | Status: DC
  Administered 2016-12-13 – 2016-12-17 (×3): 20 mg via ORAL
  Filled 2016-12-11 (×3): qty 1

## 2016-12-11 MED ORDER — HYDROXYZINE HCL 25 MG PO TABS
25.0000 mg | ORAL_TABLET | Freq: Two times a day (BID) | ORAL | Status: DC | PRN
Start: 2016-12-11 — End: 2016-12-18

## 2016-12-11 MED ORDER — ACETAMINOPHEN 650 MG RE SUPP: 650.0000 mg | Freq: Four times a day (QID) | RECTAL | Status: DC | PRN

## 2016-12-11 MED ORDER — LORATADINE 10 MG PO TABS
10.0000 mg | ORAL_TABLET | Freq: Every day | ORAL | Status: DC
  Administered 2016-12-12 – 2016-12-17 (×4): 10 mg via ORAL
  Filled 2016-12-11 (×5): qty 1

## 2016-12-11 MED ORDER — GABAPENTIN 100 MG PO CAPS: 100.0000 mg | ORAL_CAPSULE | Freq: Every day | ORAL | Status: DC | PRN

## 2016-12-11 MED ORDER — ACETAMINOPHEN 325 MG PO TABS: 650.0000 mg | ORAL_TABLET | Freq: Four times a day (QID) | ORAL | Status: DC | PRN

## 2016-12-11 MED ORDER — FAMOTIDINE IN NACL 20-0.9 MG/50ML-% IV SOLN
20.0000 mg | Freq: Once | INTRAVENOUS | Status: AC
  Administered 2016-12-12: 20 mg via INTRAVENOUS
  Filled 2016-12-11 (×2): qty 50

## 2016-12-11 MED ORDER — HYDROCODONE-ACETAMINOPHEN 5-325 MG PO TABS
1.0000 | ORAL_TABLET | Freq: Four times a day (QID) | ORAL | Status: DC | PRN
  Administered 2016-12-12: 2 via ORAL
  Administered 2016-12-14 – 2016-12-17 (×2): 1 via ORAL
  Filled 2016-12-11 (×2): qty 1
  Filled 2016-12-11: qty 2
  Filled 2016-12-11: qty 1

## 2016-12-11 MED ORDER — INSULIN ASPART 100 UNIT/ML ~~LOC~~ SOLN
0.0000 [IU] | SUBCUTANEOUS | Status: DC
  Administered 2016-12-12: 2 [IU] via SUBCUTANEOUS
  Administered 2016-12-12: 3 [IU] via SUBCUTANEOUS
  Administered 2016-12-13: 2 [IU] via SUBCUTANEOUS
  Administered 2016-12-13: 3 [IU] via SUBCUTANEOUS
  Administered 2016-12-14 – 2016-12-16 (×5): 2 [IU] via SUBCUTANEOUS
  Administered 2016-12-16 (×2): 3 [IU] via SUBCUTANEOUS
  Administered 2016-12-16 (×2): 2 [IU] via SUBCUTANEOUS
  Administered 2016-12-17: 3 [IU] via SUBCUTANEOUS
  Administered 2016-12-17 (×3): 5 [IU] via SUBCUTANEOUS

## 2016-12-11 MED ORDER — HALOPERIDOL LACTATE 5 MG/ML IJ SOLN
5.0000 mg | Freq: Once | INTRAMUSCULAR | Status: AC
  Administered 2016-12-11: 5 mg via INTRAVENOUS
  Filled 2016-12-11: qty 1

## 2016-12-11 MED ORDER — INSULIN DETEMIR 100 UNIT/ML ~~LOC~~ SOLN
45.0000 [IU] | Freq: Two times a day (BID) | SUBCUTANEOUS | Status: DC
  Administered 2016-12-12 (×2): 45 [IU] via SUBCUTANEOUS
  Filled 2016-12-11 (×4): qty 0.45

## 2016-12-11 MED ORDER — DIAZEPAM 5 MG PO TABS: 5.0000 mg | ORAL_TABLET | Freq: Four times a day (QID) | ORAL | Status: DC | PRN

## 2016-12-11 MED ORDER — ONDANSETRON HCL 4 MG PO TABS
4.0000 mg | ORAL_TABLET | Freq: Four times a day (QID) | ORAL | Status: DC | PRN
Start: 2016-12-11 — End: 2016-12-18
  Administered 2016-12-13: 4 mg via ORAL
  Filled 2016-12-11 (×2): qty 1

## 2016-12-11 MED ORDER — PROMETHAZINE HCL 25 MG/ML IJ SOLN
12.5000 mg | Freq: Four times a day (QID) | INTRAMUSCULAR | Status: DC | PRN
  Administered 2016-12-12: 12.5 mg via INTRAVENOUS
  Administered 2016-12-13 – 2016-12-16 (×3): 25 mg via INTRAVENOUS
  Filled 2016-12-11 (×4): qty 1

## 2016-12-11 MED ORDER — SODIUM CHLORIDE 0.9% FLUSH
3.0000 mL | Freq: Two times a day (BID) | INTRAVENOUS | Status: DC
  Administered 2016-12-12 – 2016-12-16 (×9): 3 mL via INTRAVENOUS

## 2016-12-11 MED ORDER — MYCOPHENOLATE SODIUM 180 MG PO TBEC
720.0000 mg | DELAYED_RELEASE_TABLET | Freq: Two times a day (BID) | ORAL | Status: DC
  Administered 2016-12-12 – 2016-12-17 (×11): 720 mg via ORAL
  Filled 2016-12-11 (×13): qty 4

## 2016-12-11 MED ORDER — FENTANYL CITRATE (PF) 100 MCG/2ML IJ SOLN
25.0000 ug | INTRAMUSCULAR | Status: DC | PRN
  Administered 2016-12-12 – 2016-12-13 (×3): 50 ug via INTRAVENOUS
  Filled 2016-12-11 (×3): qty 2

## 2016-12-11 MED ORDER — FENTANYL CITRATE (PF) 100 MCG/2ML IJ SOLN
INTRAMUSCULAR | Status: AC
  Filled 2016-12-11: qty 2

## 2016-12-11 MED ORDER — MAGNESIUM SULFATE IN D5W 1-5 GM/100ML-% IV SOLN
1.0000 g | Freq: Once | INTRAVENOUS | Status: AC
  Administered 2016-12-12: 1 g via INTRAVENOUS
  Filled 2016-12-11: qty 100

## 2016-12-11 MED ORDER — TACROLIMUS 1 MG PO CAPS
2.0000 mg | ORAL_CAPSULE | Freq: Two times a day (BID) | ORAL | Status: DC
  Administered 2016-12-12 – 2016-12-17 (×11): 2 mg via ORAL
  Filled 2016-12-11 (×13): qty 2

## 2016-12-11 MED ORDER — PREDNISONE 5 MG PO TABS
5.0000 mg | ORAL_TABLET | Freq: Every day | ORAL | Status: DC
  Administered 2016-12-12 – 2016-12-16 (×6): 5 mg via ORAL
  Filled 2016-12-11 (×6): qty 1

## 2016-12-11 MED ORDER — FLUTICASONE PROPIONATE 50 MCG/ACT NA SUSP
2.0000 | Freq: Every day | NASAL | Status: DC
  Administered 2016-12-12 – 2016-12-17 (×3): 2 via NASAL
  Filled 2016-12-11: qty 16

## 2016-12-11 NOTE — ED Notes (Signed)
ED Provider at bedside. 

## 2016-12-11 NOTE — ED Triage Notes (Signed)
Pt. Arrived via EMS with stomach pain , crying and hollering at triage. Pt. Stated, My head is also hurting so bad.  Pt. Just discharged from hospital  And I've not been able to keep my medicine down in 3 days.

## 2016-12-11 NOTE — ED Notes (Signed)
Patient crying and vomiting in triage. EKG performed due to patient complaining of severe chest pain. Will give patient medications per protocol. Patient and husband and I discussed patients labs from yesterday and what could be going on today.

## 2016-12-11 NOTE — ED Notes (Signed)
Woke pt to update and inform her she would be moving upstairs.  Pt denies any pain at this time.

## 2016-12-11 NOTE — ED Provider Notes (Signed)
[facility] DEPT Provider Note   CSN: [id number] Arrival date & time: 12/11/16  1443     History   Chief Complaint Chief Complaint  Patient presents with  . Abdominal Pain  . Nausea  . Emesis    HPI WESLEE PRESTAGE is a 31 y.o. female with an extensive past medical history listed below including diabetes, chronic kidney disease status post renal transplant on immunosuppression.   Abdominal Pain   This is a recurrent problem. Episode frequency: intermittent. Progression since onset: fluctuating. The pain is associated with an unknown factor. The pain is located in the epigastric region and periumbilical region. The quality of the pain is cramping and sharp. Associated symptoms include vomiting. The symptoms are aggravated by eating and vomiting. Relieved by: reglan yesterday while in the ED. Her past medical history is significant for GERD.  Emesis   Associated symptoms include abdominal pain.   Patient denied any marijuana use, alcohol consumption.   She does endorse having a history of acid reflux and eating spicy, heavy, deep fried foods.  Patient was evaluated here 2 days ago with negative labs, treated symptomatically and discharged home. Patient returned yesterday and had more extensive workup with negative CT scan and urinalysis. Again she was treated symptomatically and able to tolerate oral intake. She was discharged home.   She reports that she's been unable to take her home medication due to the recurrent nausea and vomiting.  Past Medical History:  Diagnosis Date  . Chronic kidney disease   . Diabetes mellitus    Pt reports diagnosis in June 2011  . Hyperlipidemia   . Hypertension   . Kidney transplant recipient   . UTI (urinary tract infection) 01/09/2015    Patient Active Problem List   Diagnosis Date Noted  . Diabetes ([place]) 05/25/2015  . Acute UTI   . Emesis   . UTI (urinary tract infection) 01/09/2015  . Abdominal pain 01/09/2015  . Intractable  nausea and vomiting 01/09/2015  . Acute on chronic kidney failure ([place]) 01/09/2015  . UTI (lower urinary tract infection) 01/09/2015  . Malnutrition of moderate degree 01/06/2015  . Renal failure (ARF), acute on chronic (HCC) 01/05/2015  . Fever, unspecified   . Renal transplant recipient   . Immunosuppressed status ([place])   . Hyponatremia   . Acute renal failure ([place]) 09/30/2014  . Sepsis ([place]) 06/24/2014  . Pyelonephritis 06/23/2014  . Dehydration 12/22/2012  . Chest pain 12/22/2012  . 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 CHRONIC KIDNEY DISEASE STAGE II (MILD) 01/04/2010  . OVARIAN FAILURE, PREMATURE 03/11/2009  . 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 SECONDARY HYPERPARATHYROIDISM 12/05/2007  . OBESITY 09/25/2007  . Anemia in chronic renal disease 09/25/2007  . LEARNING DISABILITY 09/25/2007  . Essential hypertension, benign 09/25/2007  . KIDNEY TRANSPLANTATION, HX OF 09/25/2007    Past Surgical History:  Procedure Laterality Date  . KIDNEY TRANSPLANT  2007  . RENAL BIOPSY Bilateral 2003  . THYROID LOBECTOMY      OB History    No data available       Home Medications    Prior to Admission medications   Medication Sig Start Date End Date Taking? Authorizing Provider  ACCU-CHEK SOFTCLIX LANCETS lancets Use to check blood sugar 4 times per day. 12/29/15   Renato Shin, MD  ACCU-CHEK SOFTCLIX LANCETS lancets USE TO CHECK BLOOD SUGAR FOUR TIMES DAILY AS DIRECTED 01/01/16   Renato Shin, MD  acetaminophen (TYLENOL) 500 MG tablet Take  1,000-1,500 mg by mouth every 6 (six) hours as needed for mild pain.     [provider]  calcium carbonate (TUMS) 500 MG chewable tablet Chew 2 tablets by mouth 3 (three) times daily as needed for indigestion. Takes on Monday, wednesdays, and fridays    [provider]  cetirizine (ZYRTEC) 10 MG tablet Take 10 mg by mouth daily.    [provider]  cyclobenzaprine  (FLEXERIL) 10 MG tablet Take 1 tablet (10 mg total) by mouth 2 (two) times daily as needed for muscle spasms. 07/30/16   Montine Circle, PA-C  diazepam (VALIUM) 5 MG tablet Take 1 tablet (5 mg total) by mouth every 6 (six) hours as needed for anxiety (spasms). 05/11/15   Elnora Morrison, MD  famotidine (PEPCID) 20 MG tablet Take 1 tablet (20 mg total) by mouth daily. Patient taking differently: Take 20 mg by mouth 3 (three) times a week. monday, Wednesday, friday 01/12/15   Nita Sells, MD  FIASP FLEXTOUCH 100 UNIT/ML FlexPen Inject 15 Units into the skin 3 (three) times daily with meals.  11/08/16   [provider]  fluticasone (FLONASE) 50 MCG/ACT nasal spray Place 2 sprays into both nostrils daily. 07/20/15   Muthersbaugh, Jarrett Soho, PA-C  gabapentin (NEURONTIN) 100 MG capsule Take 100 mg by mouth daily as needed (for feet).  11/12/16   [provider]  glucose blood (BAYER CONTOUR NEXT TEST) test strip Check 4 times per day. DX CODE E11.9 06/13/15   Renato Shin, MD  glucose blood [facility] VERIO) test strip Check 4 times per day. DX CODE E11.9 12/19/15   Renato Shin, MD  guaiFENesin (MUCINEX) 600 MG 12 hr tablet Take 2 tablets (1,200 mg total) by mouth 2 (two) times daily. Patient taking differently: Take 1,200 mg by mouth 2 (two) times daily as needed for cough or to loosen phlegm.  07/20/15   Muthersbaugh, Jarrett Soho, PA-C  HYDROcodone-acetaminophen (NORCO) 5-325 MG tablet Take 1 tablet by mouth every 4 (four) hours as needed. 12/10/16   Jacqlyn Larsen, PA-C  HYDROcodone-acetaminophen (NORCO/VICODIN) 5-325 MG tablet Take 1-2 tablets by mouth every 6 (six) hours as needed. 07/30/16   Montine Circle, PA-C  hydrOXYzine (ATARAX) 25 MG tablet Take 25 mg by mouth 2 (two) times daily as needed for itching.     [provider]  insulin degludec (TRESIBA FLEXTOUCH) 100 UNIT/ML SOPN FlexTouch Pen Inject 1.3 mLs (130 Units total) into the skin daily. And pen needles 1/day 07/22/16    Renato Shin, MD  lisinopril-hydrochlorothiazide (PRINZIDE,ZESTORETIC) 20-25 MG tablet Take 1 tablet by mouth daily as needed (blood pressure).  11/14/14   [provider]  metoCLOPramide (REGLAN) 10 MG tablet Take 1 tablet (10 mg total) by mouth every 6 (six) hours. 12/10/16   Jacqlyn Larsen, PA-C  mycophenolate (MYFORTIC) 360 MG TBEC Take 720 mg by mouth 2 (two) times daily.     [provider]  ondansetron (ZOFRAN) 4 MG tablet Take 1 tablet (4 mg total) by mouth every 8 (eight) hours as needed for nausea or vomiting. 08/10/16   Curatolo, Adam, DO  pantoprazole (PROTONIX) 20 MG tablet Take 1 tablet (20 mg total) by mouth daily. 01/12/15   Nita Sells, MD  predniSONE (DELTASONE) 5 MG tablet Take 5 mg by mouth at bedtime.     [provider]  promethazine (PHENERGAN) 12.5 MG tablet Take 1 tablet (12.5 mg total) by mouth every 6 (six) hours as needed for nausea or vomiting. 10/01/14   [name], Ripudeep  K, MD  simvastatin (ZOCOR) 20 MG tablet Take 20 mg by mouth 3 (three) times a week. MWF.    [provider]  tacrolimus (PROGRAF) 1 MG capsule Take 2 mg by mouth 2 (two) times daily.     [provider]    Family History Family History  Problem Relation Age of Onset  . Arthritis Mother   . Diabetes Mother   . Hypertension Mother     Social History Social History  Substance Use Topics  . Smoking status: Never Smoker  . Smokeless tobacco: Never Used  . Alcohol use No     Allergies   Benadryl [diphenhydramine-zinc acetate]; Motrin [ibuprofen]; Banana; Diphenhydramine hcl; Doxycycline; Iron dextran; and Shellfish allergy   Review of Systems Review of Systems  Gastrointestinal: Positive for abdominal pain and vomiting.   All other systems are reviewed and are negative for acute change except as noted in the HPI   Physical Exam Updated Vital Signs BP 119/83   Pulse (!) 101   Temp 98.5 F (36.9 C) (Oral)   Resp (!) 23   SpO2 93%    Physical Exam  Constitutional: She is oriented to person, place, and time. She appears well-developed and well-nourished. No distress.  In obvious discomfort   HENT:  Head: Normocephalic and atraumatic.  Nose: Nose normal.  Eyes: Pupils are equal, round, and reactive to light. Conjunctivae and EOM are normal. Right eye exhibits no discharge. Left eye exhibits no discharge. No scleral icterus.  Neck: Normal range of motion. Neck supple.  Cardiovascular: Normal rate and regular rhythm.  Exam reveals no gallop and no friction rub.   No murmur heard. Pulmonary/Chest: Effort normal and breath sounds normal. No stridor. No respiratory distress. She has no rales.  Abdominal: Soft. She exhibits no distension. There is tenderness in the epigastric area, periumbilical area and left upper quadrant. There is no rigidity, no rebound, no guarding and no CVA tenderness. No hernia.  Musculoskeletal: She exhibits no edema or tenderness.  Neurological: She is alert and oriented to person, place, and time.  Skin: Skin is warm and dry. No rash noted. She is not diaphoretic. No erythema.  Psychiatric: She has a normal mood and affect.  Vitals reviewed.    ED Treatments / Results  Labs (all labs ordered are listed, but only abnormal results are displayed) Labs Reviewed  COMPREHENSIVE METABOLIC PANEL - Abnormal; Notable for the following:       Result Value   Potassium 3.0 (*)    Chloride 100 (*)    Glucose, Bld 151 (*)    Creatinine, Ser 1.61 (*)    Calcium 7.7 (*)    GFR calc non Af Amer 41 (*)    GFR calc Af Amer 48 (*)    All other components within normal limits  CBC - Abnormal; Notable for the following:    Hemoglobin 11.5 (*)    All other components within normal limits  CBG MONITORING, ED - Abnormal; Notable for the following:    Glucose-Capillary 151 (*)    All other components within normal limits  LIPASE, BLOOD  HIV ANTIBODY (ROUTINE TESTING)  BASIC METABOLIC PANEL  CBC   MAGNESIUM    EKG  EKG Interpretation None       Radiology Ct Abdomen Pelvis Wo Contrast  Result Date: 12/10/2016 CLINICAL DATA:  Unspecified abdominal pain. Abdominal pain is diffuse, with nausea for 3 days. EXAM: CT ABDOMEN AND PELVIS WITHOUT CONTRAST TECHNIQUE: Multidetector CT imaging of the abdomen  and pelvis was performed following the standard protocol without IV contrast. COMPARISON:  10/01/2016 FINDINGS: Lower chest: No acute finding. Calcification along the left diaphragm that is stable and nonspecific. Mild atelectasis at the bases Hepatobiliary: No focal liver abnormality.No evidence of biliary obstruction or stone. Pancreas: Unremarkable. Spleen: Unremarkable. Adrenals/Urinary Tract: Negative adrenals. Atrophic left kidney. A right kidney is not visualized. There is a transplant kidney in the right lower quadrant with stable pelviectasis and mild perinephric fat edema. Chronic large appearance, measuring 14.6 cm in length. Chronic cystic change in the upper pole cortex and and along the lateral capsule. No evidence of stone. Unremarkable bladder. Stomach/Bowel:  No obstruction. No appendicitis. Vascular/Lymphatic: No acute vascular abnormality. No mass or adenopathy. Reproductive:No pathologic findings. Other: No ascites or pneumoperitoneum. Musculoskeletal: No acute abnormalities. IMPRESSION: 1. No acute finding.  Stable when compared to 10/01/2016. 2. Transplant kidney with chronic nephromegaly, perinephric stranding, and pelviectasis. Renal and perinephric cystic densities are stable. Please correlate with urinalysis. Electronically Signed   By: Monte Fantasia M.D.   On: 12/10/2016 11:40    Procedures Procedures (including critical care time)  Medications Ordered in ED Medications  ondansetron (ZOFRAN-ODT) 4 MG disintegrating tablet (not administered)  fentaNYL (SUBLIMAZE) 100 MCG/2ML injection (not administered)  famotidine (PEPCID) IVPB 20 mg premix (not administered)   gabapentin (NEURONTIN) capsule 100 mg (not administered)  cyclobenzaprine (FLEXERIL) tablet 10 mg (not administered)  fluticasone (FLONASE) 50 MCG/ACT nasal spray 2 spray (not administered)  loratadine (CLARITIN) tablet 10 mg (not administered)  pantoprazole (PROTONIX) EC tablet 20 mg (not administered)  mycophenolate (MYFORTIC) EC tablet 720 mg (not administered)  simvastatin (ZOCOR) tablet 20 mg (not administered)  calcium carbonate (TUMS - dosed in mg elemental calcium) chewable tablet 400 mg of elemental calcium (not administered)  hydrOXYzine (ATARAX/VISTARIL) tablet 25 mg (not administered)  predniSONE (DELTASONE) tablet 5 mg (not administered)  tacrolimus (PROGRAF) capsule 2 mg (not administered)  diazepam (VALIUM) tablet 5 mg (not administered)  insulin detemir (LEVEMIR) injection 45 Units (not administered)  insulin aspart (novoLOG) injection 0-15 Units (not administered)  heparin injection 5,000 Units (not administered)  sodium chloride flush (NS) 0.9 % injection 3 mL (not administered)  0.9 % NaCl with KCl 20 mEq/ L  infusion (not administered)  acetaminophen (TYLENOL) tablet 650 mg (not administered)    Or  acetaminophen (TYLENOL) suppository 650 mg (not administered)  ondansetron (ZOFRAN) tablet 4 mg (not administered)    Or  ondansetron (ZOFRAN) injection 4 mg (not administered)  promethazine (PHENERGAN) injection 12.5-25 mg (not administered)  metoCLOPramide (REGLAN) injection 10 mg (not administered)  potassium chloride SA (K-DUR,KLOR-CON) CR tablet 20 mEq (not administered)  magnesium sulfate IVPB 1 g 100 mL (1 g Intravenous New Bag/Given 12/12/16 0001)  HYDROcodone-acetaminophen (NORCO/VICODIN) 5-325 MG per tablet 1-2 tablet (not administered)  fentaNYL (SUBLIMAZE) injection 25-50 mcg (not administered)  hydrALAZINE (APRESOLINE) injection 10 mg (not administered)  ondansetron (ZOFRAN-ODT) disintegrating tablet 4 mg (4 mg Oral Given 12/11/16 1621)  sodium chloride 0.9  % bolus 1,000 mL (0 mLs Intravenous Stopped 12/11/16 2356)  metoCLOPramide (REGLAN) injection 10 mg (10 mg Intravenous Given 12/11/16 1847)  haloperidol lactate (HALDOL) injection 5 mg (5 mg Intravenous Given 12/11/16 1920)     Initial Impression / Assessment and Plan / ED Course  I have reviewed the triage vital signs and the nursing notes.  Pertinent labs & imaging results that were available during my care of the patient were reviewed by me and considered in my medical decision making (see  chart for details).     Surgical vomiting. Given a history of diabetes possible gastroparesis. She's been worked up for infectious/inflammatory process which has been negative. Denies any use of marijuana that would be concerning for cannabinoid hyperemesis. Urine pregnancy was negative yesterday.  Possible exacerbation of gastritis/GERD.   Patient treated symptomatically with IV fluids, nausea medication (including Reglan, Haldol, Phenergan).  Patient still had intractable nausea with dry heaves. As this was her third visit to the emergency department and the fact that she has been unable to take her home medications including her immunosuppressant meds, patient was admitted to the hospital for IV hydration and symptomatic management.    Final Clinical Impressions(s) / ED Diagnoses   Final diagnoses:  Intractable cyclical vomiting with nausea      Cardama, Grayce Sessions, MD 12/12/16 0009

## 2016-12-11 NOTE — H&P (Signed)
History and Physical    [name] OZD:[id number] DOB: 23-Nov-1985 DOA: 12/11/2016  PCP: [name], MD   Patient coming from: Home  Chief Complaint: Epigastric pain, N/V, unable to keep medications down   HPI: [name] is a 31 y.o. female with medical history significant for type 1 diabetes mellitus with end-stage renal disease status post transplant, followed by endocrinology until dismissed from the practice a few months ago due to chronic noncompliance, and hypertension, now presenting to the emergency department for evaluation of upper abdominal discomfort, nausea, and nonbloody vomiting. She reports that the symptoms began approximately 4 days ago and she has been unable to keep her medications down since that time. She has visited the emergency department several times over the past 48 hours, noted to have stable labs and nonacute CT abdomen and pelvis, treated symptomatically, able to take her medications after aggressive treatment in the ED, and has been discharged back home. She now returns with the same complaints, denies any significant worsening, just not improving. Reports that she has been unable to take any of her medications at home.  ED Course: Upon arrival to the ED, patient is found to be afebrile, saturating well on room air, slightly tachycardic, and hypertensive. EKG features a sinus tachycardia with rate 112. CT of the abdomen and pelvis feature some chronic perinephric findings, but no acute abnormality. Chemistry panel is notable for a potassium of 3.0 and creatinine 1.61, just slightly up from her apparent baseline. CBC is notable for a stable chronic normocytic anemia. Patient was treated with 1 L of normal saline, Zofran, Reglan, Phenergan, Haldol, and fentanyl. She continues to be symptomatic and reports that she cannot tolerate her medications orally. She will be observed on the telemetry unit for ongoing evaluation and management of this.  Review of Systems:    All other systems reviewed and apart from HPI, are negative.  Past Medical History:  Diagnosis Date  . Chronic kidney disease   . Diabetes mellitus    Pt reports diagnosis in June 2011  . Hyperlipidemia   . Hypertension   . Kidney transplant recipient   . UTI (urinary tract infection) 01/09/2015    Past Surgical History:  Procedure Laterality Date  . KIDNEY TRANSPLANT  2007  . RENAL BIOPSY Bilateral 2003  . THYROID LOBECTOMY       reports that she has never smoked. She has never used smokeless tobacco. She reports that she does not drink alcohol or use drugs.  Allergies  Allergen Reactions  . Benadryl [Diphenhydramine-Zinc Acetate] Shortness Of Breath  . Motrin [Ibuprofen] Shortness Of Breath and Itching    Per pt  . Banana Other (See Comments)    Sick on the stomach  . Diphenhydramine Hcl     REACTION: Stopped breathing in ICU  . Doxycycline     Shortness of Breath   . Iron Dextran     REACTION: vein irritation  . Shellfish Allergy Hives    Family History  Problem Relation Age of Onset  . Arthritis Mother   . Diabetes Mother   . Hypertension Mother      Prior to Admission medications   Medication Sig Start Date End Date Taking? Authorizing Provider  ACCU-CHEK SOFTCLIX LANCETS lancets Use to check blood sugar 4 times per day. 12/29/15   Renato Shin, MD  ACCU-CHEK SOFTCLIX LANCETS lancets USE TO CHECK BLOOD SUGAR FOUR TIMES DAILY AS DIRECTED 01/01/16   Renato Shin, MD  acetaminophen (TYLENOL) 500 MG tablet  Take 1,000-1,500 mg by mouth every 6 (six) hours as needed for mild pain.     [provider]  calcium carbonate (TUMS) 500 MG chewable tablet Chew 2 tablets by mouth 3 (three) times daily as needed for indigestion. Takes on Monday, wednesdays, and fridays    [provider]  cetirizine (ZYRTEC) 10 MG tablet Take 10 mg by mouth daily.    [provider]  cyclobenzaprine (FLEXERIL) 10 MG tablet Take 1 tablet (10 mg total) by mouth  2 (two) times daily as needed for muscle spasms. 07/30/16   Montine Circle, PA-C  diazepam (VALIUM) 5 MG tablet Take 1 tablet (5 mg total) by mouth every 6 (six) hours as needed for anxiety (spasms). 05/11/15   Elnora Morrison, MD  famotidine (PEPCID) 20 MG tablet Take 1 tablet (20 mg total) by mouth daily. Patient taking differently: Take 20 mg by mouth 3 (three) times a week. monday, Wednesday, friday 01/12/15   Nita Sells, MD  FIASP FLEXTOUCH 100 UNIT/ML FlexPen Inject 15 Units into the skin 3 (three) times daily with meals.  11/08/16   [provider]  fluticasone (FLONASE) 50 MCG/ACT nasal spray Place 2 sprays into both nostrils daily. 07/20/15   Muthersbaugh, Jarrett Soho, PA-C  gabapentin (NEURONTIN) 100 MG capsule Take 100 mg by mouth daily as needed (for feet).  11/12/16   [provider]  glucose blood (BAYER CONTOUR NEXT TEST) test strip Check 4 times per day. DX CODE E11.9 06/13/15   Renato Shin, MD  glucose blood [facility] VERIO) test strip Check 4 times per day. DX CODE E11.9 12/19/15   Renato Shin, MD  guaiFENesin (MUCINEX) 600 MG 12 hr tablet Take 2 tablets (1,200 mg total) by mouth 2 (two) times daily. Patient taking differently: Take 1,200 mg by mouth 2 (two) times daily as needed for cough or to loosen phlegm.  07/20/15   Muthersbaugh, Jarrett Soho, PA-C  HYDROcodone-acetaminophen (NORCO) 5-325 MG tablet Take 1 tablet by mouth every 4 (four) hours as needed. 12/10/16   Jacqlyn Larsen, PA-C  HYDROcodone-acetaminophen (NORCO/VICODIN) 5-325 MG tablet Take 1-2 tablets by mouth every 6 (six) hours as needed. 07/30/16   Montine Circle, PA-C  hydrOXYzine (ATARAX) 25 MG tablet Take 25 mg by mouth 2 (two) times daily as needed for itching.     [provider]  insulin degludec (TRESIBA FLEXTOUCH) 100 UNIT/ML SOPN FlexTouch Pen Inject 1.3 mLs (130 Units total) into the skin daily. And pen needles 1/day 07/22/16   Renato Shin, MD  lisinopril-hydrochlorothiazide  (PRINZIDE,ZESTORETIC) 20-25 MG tablet Take 1 tablet by mouth daily as needed (blood pressure).  11/14/14   [provider]  metoCLOPramide (REGLAN) 10 MG tablet Take 1 tablet (10 mg total) by mouth every 6 (six) hours. 12/10/16   Jacqlyn Larsen, PA-C  mycophenolate (MYFORTIC) 360 MG TBEC Take 720 mg by mouth 2 (two) times daily.     [provider]  ondansetron (ZOFRAN) 4 MG tablet Take 1 tablet (4 mg total) by mouth every 8 (eight) hours as needed for nausea or vomiting. 08/10/16   Curatolo, Adam, DO  pantoprazole (PROTONIX) 20 MG tablet Take 1 tablet (20 mg total) by mouth daily. 01/12/15   Nita Sells, MD  predniSONE (DELTASONE) 5 MG tablet Take 5 mg by mouth at bedtime.     [provider]  promethazine (PHENERGAN) 12.5 MG tablet Take 1 tablet (12.5 mg total) by mouth every 6 (six) hours as needed for nausea or vomiting. 10/01/14   [name],  Ripudeep K, MD  simvastatin (ZOCOR) 20 MG tablet Take 20 mg by mouth 3 (three) times a week. MWF.    [provider]  tacrolimus (PROGRAF) 1 MG capsule Take 2 mg by mouth 2 (two) times daily.     [provider]    Physical Exam: Vitals:   12/11/16 1455 12/11/16 1557 12/11/16 1614  BP: (!) 161/96 (!) 144/96 (!) 177/117  Pulse: 100 (!) 113 (!) 101  Resp: 18 (!) 21 20  Temp: 98.4 F (36.9 C) 98.5 F (36.9 C)   TempSrc: Oral Oral   SpO2: 99% 100% 100%      Constitutional: NAD, calm, appears uncomfortable Eyes: PERTLA, lids and conjunctivae normal ENMT: Mucous membranes are moist. Posterior pharynx clear of any exudate or lesions.   Neck: normal, supple, no masses, no thyromegaly Respiratory: clear to auscultation bilaterally, no wheezing, no crackles. Normal respiratory effort.    Cardiovascular: S1 & S2 heard, regular rate and rhythm. No extremity edema. No significant JVD. Abdomen: No distension, soft, mild generalized tenderness. Bowel sounds appreciated.  Musculoskeletal: no clubbing / cyanosis. No  joint deformity upper and lower extremities.    Skin: no significant rashes, lesions, ulcers. Warm, dry, well-perfused. Neurologic: CN 2-12 grossly intact. Sensation intact. Strength 5/5 in all 4 limbs.  Psychiatric: Alert and oriented x 3. Calm, cooperative.     Labs on Admission: I have personally reviewed following labs and imaging studies  CBC:  Recent Labs Lab 12/09/16 0006 12/09/16 0917 12/10/16 0609 12/11/16 1458  WBC 8.8  --  7.7 10.3  HGB 10.8* 11.2* 10.7* 11.5*  HCT 33.9*  --  34.3* 36.7  MCV 87.6  --  88.6 88.4  PLT 211  --  230 185   Basic Metabolic Panel:  Recent Labs Lab 12/09/16 0006 12/10/16 0609 12/11/16 1458  NA 137 138 138  K 3.5 3.6 3.0*  CL 103 100* 100*  CO2 24 29 25   GLUCOSE 171* 282* 151*  BUN 27* 19 18  CREATININE 1.50* 1.39* 1.61*  CALCIUM 7.9* 7.9* 7.7*   GFR: Estimated Creatinine Clearance: 53.5 mL/min (A) (by C-G formula based on SCr of 1.61 mg/dL (H)). Liver Function Tests:  Recent Labs Lab 12/09/16 1330 12/10/16 0609 12/11/16 1458  AST 16 17 21   ALT 14 15 14   ALKPHOS 78 73 65  BILITOT 0.6 0.8 1.0  PROT 7.5 7.3 6.8  ALBUMIN 3.8 3.7 3.6    Recent Labs Lab 12/09/16 1330 12/10/16 0609 12/11/16 1458  LIPASE 30 27 39   No results for input(s): AMMONIA in the last 168 hours. Coagulation Profile: No results for input(s): INR, PROTIME in the last 168 hours. Cardiac Enzymes: No results for input(s): CKTOTAL, CKMB, CKMBINDEX, TROPONINI in the last 168 hours. BNP (last 3 results) No results for input(s): PROBNP in the last 8760 hours. HbA1C: No results for input(s): HGBA1C in the last 72 hours. CBG:  Recent Labs Lab 12/11/16 1458  GLUCAP 151*   Lipid Profile: No results for input(s): CHOL, HDL, LDLCALC, TRIG, CHOLHDL, LDLDIRECT in the last 72 hours. Thyroid Function Tests: No results for input(s): TSH, T4TOTAL, FREET4, T3FREE, THYROIDAB in the last 72 hours. Anemia Panel: No results for input(s): VITAMINB12,  FOLATE, FERRITIN, TIBC, IRON, RETICCTPCT in the last 72 hours. Urine analysis:    Component Value Date/Time   COLORURINE YELLOW 12/10/2016 0611   APPEARANCEUR CLEAR 12/10/2016 0611   LABSPEC 1.014 12/10/2016 0611   PHURINE 6.0 12/10/2016 0611   GLUCOSEU 150 (A) 12/10/2016 6314  HGBUR NEGATIVE 12/10/2016 0611   HGBUR negative 01/26/2010 1026   BILIRUBINUR NEGATIVE 12/10/2016 0611   BILIRUBINUR NEG 01/01/2011 1050   KETONESUR NEGATIVE 12/10/2016 0611   PROTEINUR 100 (A) 12/10/2016 0611   UROBILINOGEN 0.2 01/09/2015 1333   NITRITE NEGATIVE 12/10/2016 0611   LEUKOCYTESUR NEGATIVE 12/10/2016 0611   Sepsis Labs: @LABRCNTIP (procalcitonin:4,lacticidven:4) )No results found for this or any previous visit (from the past 240 hour(s)).   Radiological Exams on Admission: Ct Abdomen Pelvis Wo Contrast  Result Date: 12/10/2016 CLINICAL DATA:  Unspecified abdominal pain. Abdominal pain is diffuse, with nausea for 3 days. EXAM: CT ABDOMEN AND PELVIS WITHOUT CONTRAST TECHNIQUE: Multidetector CT imaging of the abdomen and pelvis was performed following the standard protocol without IV contrast. COMPARISON:  10/01/2016 FINDINGS: Lower chest: No acute finding. Calcification along the left diaphragm that is stable and nonspecific. Mild atelectasis at the bases Hepatobiliary: No focal liver abnormality.No evidence of biliary obstruction or stone. Pancreas: Unremarkable. Spleen: Unremarkable. Adrenals/Urinary Tract: Negative adrenals. Atrophic left kidney. A right kidney is not visualized. There is a transplant kidney in the right lower quadrant with stable pelviectasis and mild perinephric fat edema. Chronic large appearance, measuring 14.6 cm in length. Chronic cystic change in the upper pole cortex and and along the lateral capsule. No evidence of stone. Unremarkable bladder. Stomach/Bowel:  No obstruction. No appendicitis. Vascular/Lymphatic: No acute vascular abnormality. No mass or adenopathy.  Reproductive:No pathologic findings. Other: No ascites or pneumoperitoneum. Musculoskeletal: No acute abnormalities. IMPRESSION: 1. No acute finding.  Stable when compared to 10/01/2016. 2. Transplant kidney with chronic nephromegaly, perinephric stranding, and pelviectasis. Renal and perinephric cystic densities are stable. Please correlate with urinalysis. Electronically Signed   By: Monte Fantasia M.D.   On: 12/10/2016 11:40    EKG: Independently reviewed. Sinus tachycardia (rate 112).   Assessment/Plan  1. Intractable nausea and vomiting - Pt presents with N/V, reports inability to take her medications d/t this  - She has presented to the ED 5 times in the past two days for this, has been able to take her medications after symptomatic care in ED, but relapses at home despite Rx for multiple antiemetics  - CT abd/pelvis without any acute findings  - Suspected secondary to gastroparesis  - Continue IVF hydration, monitor and correct electrolytes, continue antiemetics, Reglan scheduled   2. Hx renal transplant, CKD stage II  - Pt developed ESRD secondary to DM type I, now s/p transplant  - SCr is 1.61 on admission, only slightly up from her apparent baseline  - No acute abnormalities on CT abd/pelvis  - Continue low-dose prednisone, tacrolimus, mycophenolate    3. Insulin-dependent DM  - A1c was 11.8% in May 2018  - Followed with endocrinology until recently dismissed from practice  - Managed at home with Tresiba 130 units qD and Fiasp 15 units TID  - She is not tolerated oral intake on admission  - Plan to check CBG q4h, start Levemir 45 units BID with a moderate-intensity SSI   4. Hypertension  - BP is elevated in ED, with N/V likely contributing  - Hold lisinopril-HCTZ given bump in Cr and hypovolemia  - Treat N/V as above, use hydralazine IVP's prn    5. Hypokalemia  - Serum potassium is 3.0 on admission in setting of N/V  - Continue cardiac monitoring, add KCl to IVF, repeat  chem panel in am     DVT prophylaxis: sq heparin  Code Status: Full  Family Communication: Discussed with patient Disposition Plan: Observe  on telemetry Consults called: None Admission status: Observation    Vianne Bulls, MD [facility] Pager [phone]  If 7PM-7AM, please contact night-coverage [email or web address] Password [email or web address]  12/11/2016, 10:13 PM

## 2016-12-11 NOTE — ED Notes (Signed)
Attempted to call report

## 2016-12-12 DIAGNOSIS — R112 Nausea with vomiting, unspecified: Secondary | ICD-10-CM | POA: Diagnosis not present

## 2016-12-12 DIAGNOSIS — Z888 Allergy status to other drugs, medicaments and biological substances status: Secondary | ICD-10-CM | POA: Diagnosis not present

## 2016-12-12 DIAGNOSIS — N182 Chronic kidney disease, stage 2 (mild): Secondary | ICD-10-CM | POA: Diagnosis not present

## 2016-12-12 DIAGNOSIS — Z833 Family history of diabetes mellitus: Secondary | ICD-10-CM | POA: Diagnosis not present

## 2016-12-12 DIAGNOSIS — K3184 Gastroparesis: Secondary | ICD-10-CM | POA: Diagnosis present

## 2016-12-12 DIAGNOSIS — I120 Hypertensive chronic kidney disease with stage 5 chronic kidney disease or end stage renal disease: Secondary | ICD-10-CM | POA: Diagnosis present

## 2016-12-12 DIAGNOSIS — R1013 Epigastric pain: Secondary | ICD-10-CM | POA: Diagnosis not present

## 2016-12-12 DIAGNOSIS — N179 Acute kidney failure, unspecified: Secondary | ICD-10-CM | POA: Diagnosis not present

## 2016-12-12 DIAGNOSIS — E1065 Type 1 diabetes mellitus with hyperglycemia: Secondary | ICD-10-CM | POA: Diagnosis present

## 2016-12-12 DIAGNOSIS — Z91018 Allergy to other foods: Secondary | ICD-10-CM | POA: Diagnosis not present

## 2016-12-12 DIAGNOSIS — E1022 Type 1 diabetes mellitus with diabetic chronic kidney disease: Secondary | ICD-10-CM | POA: Diagnosis present

## 2016-12-12 DIAGNOSIS — R10816 Epigastric abdominal tenderness: Secondary | ICD-10-CM | POA: Diagnosis not present

## 2016-12-12 DIAGNOSIS — E1043 Type 1 diabetes mellitus with diabetic autonomic (poly)neuropathy: Secondary | ICD-10-CM | POA: Diagnosis present

## 2016-12-12 DIAGNOSIS — Z91013 Allergy to seafood: Secondary | ICD-10-CM | POA: Diagnosis not present

## 2016-12-12 DIAGNOSIS — I10 Essential (primary) hypertension: Secondary | ICD-10-CM | POA: Diagnosis not present

## 2016-12-12 DIAGNOSIS — Z6827 Body mass index (BMI) 27.0-27.9, adult: Secondary | ICD-10-CM | POA: Diagnosis not present

## 2016-12-12 DIAGNOSIS — E876 Hypokalemia: Secondary | ICD-10-CM | POA: Diagnosis present

## 2016-12-12 DIAGNOSIS — G43A1 Cyclical vomiting, intractable: Secondary | ICD-10-CM | POA: Diagnosis not present

## 2016-12-12 DIAGNOSIS — R109 Unspecified abdominal pain: Secondary | ICD-10-CM | POA: Diagnosis not present

## 2016-12-12 DIAGNOSIS — N189 Chronic kidney disease, unspecified: Secondary | ICD-10-CM | POA: Diagnosis not present

## 2016-12-12 DIAGNOSIS — Z9119 Patient's noncompliance with other medical treatment and regimen: Secondary | ICD-10-CM | POA: Diagnosis not present

## 2016-12-12 DIAGNOSIS — Z992 Dependence on renal dialysis: Secondary | ICD-10-CM | POA: Diagnosis not present

## 2016-12-12 DIAGNOSIS — N186 End stage renal disease: Secondary | ICD-10-CM | POA: Diagnosis present

## 2016-12-12 DIAGNOSIS — Z79899 Other long term (current) drug therapy: Secondary | ICD-10-CM | POA: Diagnosis not present

## 2016-12-12 DIAGNOSIS — E663 Overweight: Secondary | ICD-10-CM | POA: Diagnosis present

## 2016-12-12 DIAGNOSIS — Z940 Kidney transplant status: Secondary | ICD-10-CM | POA: Diagnosis not present

## 2016-12-12 DIAGNOSIS — Z794 Long term (current) use of insulin: Secondary | ICD-10-CM | POA: Diagnosis not present

## 2016-12-12 LAB — BASIC METABOLIC PANEL
Anion gap: 7 (ref 5–15)
BUN: 14 mg/dL (ref 6–20)
CHLORIDE: 104 mmol/L (ref 101–111)
CO2: 28 mmol/L (ref 22–32)
CREATININE: 1.33 mg/dL — AB (ref 0.44–1.00)
Calcium: 6.8 mg/dL — ABNORMAL LOW (ref 8.9–10.3)
GFR calc Af Amer: >60 mL/min (ref >60)
GFR calc non Af Amer: 52 mL/min — ABNORMAL LOW (ref >60)
Glucose, Bld: 140 mg/dL — ABNORMAL HIGH (ref 65–99)
Potassium: 3 mmol/L — ABNORMAL LOW (ref 3.5–5.1)
SODIUM: 139 mmol/L (ref 135–145)

## 2016-12-12 LAB — CBC
HCT: 30.6 % — ABNORMAL LOW (ref 36.0–46.0)
Hemoglobin: 9.7 g/dL — ABNORMAL LOW (ref 12.0–15.0)
MCH: 28.4 pg (ref 26.0–34.0)
MCHC: 31.7 g/dL (ref 30.0–36.0)
MCV: 89.7 fL (ref 78.0–100.0)
PLATELETS: 201 10*3/uL (ref 150–400)
RBC: 3.41 MIL/uL — ABNORMAL LOW (ref 3.87–5.11)
RDW: 14.6 % (ref 11.5–15.5)
WBC: 6.8 10*3/uL (ref 4.0–10.5)

## 2016-12-12 LAB — GLUCOSE, CAPILLARY
GLUCOSE-CAPILLARY: 100 mg/dL — AB (ref 65–99)
GLUCOSE-CAPILLARY: 138 mg/dL — AB (ref 65–99)
GLUCOSE-CAPILLARY: 81 mg/dL (ref 65–99)
Glucose-Capillary: 194 mg/dL — ABNORMAL HIGH (ref 65–99)
Glucose-Capillary: 102 mg/dL — ABNORMAL HIGH (ref 65–99)
Glucose-Capillary: 94 mg/dL (ref 65–99)

## 2016-12-12 LAB — HEMOGLOBIN A1C
HEMOGLOBIN A1C: 11.5 % — AB (ref 4.8–5.6)
Mean Plasma Glucose: 283.35 mg/dL

## 2016-12-12 LAB — MAGNESIUM: Magnesium: 1.7 mg/dL (ref 1.7–2.4)

## 2016-12-12 LAB — HIV ANTIBODY (ROUTINE TESTING W REFLEX): HIV Screen 4th Generation wRfx: NONREACTIVE

## 2016-12-12 MED ORDER — INSULIN DETEMIR 100 UNIT/ML ~~LOC~~ SOLN
25.0000 [IU] | Freq: Once | SUBCUTANEOUS | Status: AC
  Administered 2016-12-12: 25 [IU] via SUBCUTANEOUS
  Filled 2016-12-12: qty 0.25

## 2016-12-12 MED ORDER — POTASSIUM CHLORIDE CRYS ER 20 MEQ PO TBCR
40.0000 meq | EXTENDED_RELEASE_TABLET | Freq: Two times a day (BID) | ORAL | Status: DC
  Administered 2016-12-12 (×2): 40 meq via ORAL
  Filled 2016-12-12 (×11): qty 2

## 2016-12-12 MED ORDER — MAGNESIUM SULFATE 2 GM/50ML IV SOLN
2.0000 g | Freq: Once | INTRAVENOUS | Status: AC
  Administered 2016-12-12: 2 g via INTRAVENOUS
  Filled 2016-12-12: qty 50

## 2016-12-12 MED ORDER — PANTOPRAZOLE SODIUM 40 MG IV SOLR
40.0000 mg | Freq: Every day | INTRAVENOUS | Status: DC
  Administered 2016-12-13 – 2016-12-14 (×2): 40 mg via INTRAVENOUS
  Filled 2016-12-12 (×2): qty 40

## 2016-12-12 NOTE — ED Notes (Signed)
Attempted to call report

## 2016-12-12 NOTE — Consult Note (Signed)
Referring Provider: Dr. Carolin Sicks Primary Care Physician:  [name], MD Primary Gastroenterologist:  UNASSIGNED  Reason for Consultation:  Nausea and Vomiting; Abdominal pain  HPI: [name] is a 31 y.o. female with Type 1 DM with ESRD who is s/p a renal transplant in 2007 being seen for a consult due to acute onset of persistent nausea and vomiting that started last Wednesday (12/01/16). Had epigastric pain as well and was unable to keep her transplant meds down as well for the past several days prior to admit. Noncontrast CT negative for any acute findings. No known history of ulcers. Denies any new meds. Denies NSAIDs. Denies alcohol. She is very sleepy and unable to give me much history but her family gives most of the history. She has been able to tolerate full liquids today and is on Metoclopramide 10 mg IV Q 8 hours.  Past Medical History:  Diagnosis Date  . Chronic kidney disease   . Diabetes mellitus    Pt reports diagnosis in June 2011  . Hyperlipidemia   . Hypertension   . Kidney transplant recipient   . UTI (urinary tract infection) 01/09/2015    Past Surgical History:  Procedure Laterality Date  . KIDNEY TRANSPLANT  2007  . RENAL BIOPSY Bilateral 2003  . THYROID LOBECTOMY      Prior to Admission medications   Medication Sig Start Date End Date Taking? Authorizing Provider  ACCU-CHEK SOFTCLIX LANCETS lancets Use to check blood sugar 4 times per day. 12/29/15   Renato Shin, MD  ACCU-CHEK SOFTCLIX LANCETS lancets USE TO CHECK BLOOD SUGAR FOUR TIMES DAILY AS DIRECTED 01/01/16   Renato Shin, MD  acetaminophen (TYLENOL) 500 MG tablet Take 1,000-1,500 mg by mouth every 6 (six) hours as needed for mild pain.     [provider]  calcium carbonate (TUMS) 500 MG chewable tablet Chew 2 tablets by mouth 3 (three) times daily as needed for indigestion. Takes on Monday, wednesdays, and fridays    [provider]  cetirizine (ZYRTEC) 10 MG tablet Take 10 mg  by mouth daily.    [provider]  cyclobenzaprine (FLEXERIL) 10 MG tablet Take 1 tablet (10 mg total) by mouth 2 (two) times daily as needed for muscle spasms. 07/30/16   Montine Circle, PA-C  diazepam (VALIUM) 5 MG tablet Take 1 tablet (5 mg total) by mouth every 6 (six) hours as needed for anxiety (spasms). 05/11/15   Elnora Morrison, MD  famotidine (PEPCID) 20 MG tablet Take 1 tablet (20 mg total) by mouth daily. Patient taking differently: Take 20 mg by mouth 3 (three) times a week. monday, Wednesday, friday 01/12/15   Nita Sells, MD  FIASP FLEXTOUCH 100 UNIT/ML FlexPen Inject 15 Units into the skin 3 (three) times daily with meals.  11/08/16   [provider]  fluticasone (FLONASE) 50 MCG/ACT nasal spray Place 2 sprays into both nostrils daily. 07/20/15   Muthersbaugh, Jarrett Soho, PA-C  gabapentin (NEURONTIN) 100 MG capsule Take 100 mg by mouth daily as needed (for feet).  11/12/16   [provider]  glucose blood (BAYER CONTOUR NEXT TEST) test strip Check 4 times per day. DX CODE E11.9 06/13/15   Renato Shin, MD  glucose blood [facility] VERIO) test strip Check 4 times per day. DX CODE E11.9 12/19/15   Renato Shin, MD  guaiFENesin (MUCINEX) 600 MG 12 hr tablet Take 2 tablets (1,200 mg total) by mouth 2 (two) times daily. Patient taking differently: Take 1,200 mg by mouth 2 (two)  times daily as needed for cough or to loosen phlegm.  07/20/15   Muthersbaugh, Jarrett Soho, PA-C  HYDROcodone-acetaminophen (NORCO) 5-325 MG tablet Take 1 tablet by mouth every 4 (four) hours as needed. 12/10/16   Jacqlyn Larsen, PA-C  HYDROcodone-acetaminophen (NORCO/VICODIN) 5-325 MG tablet Take 1-2 tablets by mouth every 6 (six) hours as needed. 07/30/16   Montine Circle, PA-C  hydrOXYzine (ATARAX) 25 MG tablet Take 25 mg by mouth 2 (two) times daily as needed for itching.     [provider]  insulin degludec (TRESIBA FLEXTOUCH) 100 UNIT/ML SOPN FlexTouch Pen Inject 1.3 mLs (130 Units  total) into the skin daily. And pen needles 1/day 07/22/16   Renato Shin, MD  lisinopril-hydrochlorothiazide (PRINZIDE,ZESTORETIC) 20-25 MG tablet Take 1 tablet by mouth daily as needed (blood pressure).  11/14/14   [provider]  metoCLOPramide (REGLAN) 10 MG tablet Take 1 tablet (10 mg total) by mouth every 6 (six) hours. 12/10/16   Jacqlyn Larsen, PA-C  mycophenolate (MYFORTIC) 360 MG TBEC Take 720 mg by mouth 2 (two) times daily.     [provider]  ondansetron (ZOFRAN) 4 MG tablet Take 1 tablet (4 mg total) by mouth every 8 (eight) hours as needed for nausea or vomiting. 08/10/16   Curatolo, Adam, DO  pantoprazole (PROTONIX) 20 MG tablet Take 1 tablet (20 mg total) by mouth daily. 01/12/15   Nita Sells, MD  predniSONE (DELTASONE) 5 MG tablet Take 5 mg by mouth at bedtime.     [provider]  promethazine (PHENERGAN) 12.5 MG tablet Take 1 tablet (12.5 mg total) by mouth every 6 (six) hours as needed for nausea or vomiting. 10/01/14   [name], Ripudeep K, MD  simvastatin (ZOCOR) 20 MG tablet Take 20 mg by mouth 3 (three) times a week. MWF.    [provider]  tacrolimus (PROGRAF) 1 MG capsule Take 2 mg by mouth 2 (two) times daily.     [provider]    Scheduled Meds: . fluticasone  2 spray Each Nare Daily  . heparin  5,000 Units Subcutaneous Q8H  . insulin aspart  0-15 Units Subcutaneous Q4H  . insulin detemir  45 Units Subcutaneous BID  . loratadine  10 mg Oral Daily  . metoCLOPramide (REGLAN) injection  10 mg Intravenous Q8H  . mycophenolate  720 mg Oral BID  . pantoprazole  20 mg Oral Daily  . potassium chloride  40 mEq Oral BID  . predniSONE  5 mg Oral QHS  . [START ON 12/13/2016] simvastatin  20 mg Oral Once per day on Mon Wed Fri  . sodium chloride flush  3 mL Intravenous Q12H  . tacrolimus  2 mg Oral BID   Continuous Infusions: PRN Meds:.acetaminophen **OR** acetaminophen, calcium carbonate, cyclobenzaprine, diazepam, fentaNYL  (SUBLIMAZE) injection, gabapentin, hydrALAZINE, HYDROcodone-acetaminophen, hydrOXYzine, ondansetron **OR** ondansetron (ZOFRAN) IV, promethazine  Allergies as of 12/11/2016 - Review Complete 12/11/2016  Allergen Reaction Noted  . Benadryl [diphenhydramine-zinc acetate] Shortness Of Breath 01/22/2016  . Motrin [ibuprofen] Shortness Of Breath and Itching 09/17/2010  . Banana Other (See Comments) 12/22/2012  . Diphenhydramine hcl  02/24/2010  . Doxycycline  11/20/2015  . Iron dextran  02/24/2010  . Shellfish allergy Hives 12/22/2012    Family History  Problem Relation Age of Onset  . Arthritis Mother   . Diabetes Mother   . Hypertension Mother     Social History   Social History  . Marital status: Single    Spouse name: N/A  . Number  of children: N/A  . Years of education: N/A   Occupational History  . Not on file.   Social History Main Topics  . Smoking status: Never Smoker  . Smokeless tobacco: Never Used  . Alcohol use No  . Drug use: No  . Sexual activity: Yes    Birth control/ protection: None   Other Topics Concern  . Not on file   Social History Narrative  . No narrative on file    Review of Systems: All negative except as stated above in HPI.  Physical Exam: Vital signs: Vitals:   12/12/16 0932 12/12/16 1531  BP: (!) 135/42 116/84  Pulse: 69 88  Resp:  16  Temp: 98.1 F (36.7 C) 98.2 F (36.8 C)  SpO2:  96%   Last BM Date: 12/11/16 General:  Lethargic, Well-developed, well-nourished, pleasant and cooperative in NAD Head: normocephalic, atraumatic Eyes: anicteric sclera ENT: oropharynx clear Neck: supple, nontender Lungs:  Clear throughout to auscultation.   No wheezes, crackles, or rhonchi. No acute distress. Heart:  Regular rate and rhythm; no murmurs, clicks, rubs,  or gallops. Abdomen: Epigastric and RUQ tenderness with guarding, soft, nondistended, +BS Rectal:  Deferred Ext: no edema  GI:  Lab Results:  Recent Labs  12/10/16 0609  12/11/16 1458 12/12/16 0410  WBC 7.7 10.3 6.8  HGB 10.7* 11.5* 9.7*  HCT 34.3* 36.7 30.6*  PLT 230 239 201   BMET  Recent Labs  12/10/16 0609 12/11/16 1458 12/12/16 0410  NA 138 138 139  K 3.6 3.0* 3.0*  CL 100* 100* 104  CO2 29 25 28   GLUCOSE 282* 151* 140*  BUN 19 18 14   CREATININE 1.39* 1.61* 1.33*  CALCIUM 7.9* 7.7* 6.8*   LFT  Recent Labs  12/11/16 1458  PROT 6.8  ALBUMIN 3.6  AST 21  ALT 14  ALKPHOS 65  BILITOT 1.0   PT/INR No results for input(s): LABPROT, INR in the last 72 hours.   Studies/Results: No results found.  Impression/Plan: 31 yo with Type 1 DM with ESRD and s/p kidney transplant (2007) with intractable nausea/vomiting and epigastric pain that started last week that appears to have improved with Metoclopramide 10 mg IV Q 8 hours. Doubt peptic ulcer. Most likely has diabetic gastroparesis. Would manage conservatively with IV Metoclopramide for now and if vomiting recurs then would do an EGD vs UGIS to further evaluate. Change to Protonix 40 mg IV QD. Dr. Oletta Lamas to f/u tomorrow.    LOS: 0 days   [place] C.  12/12/2016, 4:17 PM  Pager [phone]  AFTER 5 pm or on weekends please call [phone]

## 2016-12-12 NOTE — Care Management Note (Signed)
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 Pt with multiple ED visits for the past 3 days for N/V, abd. pain. Lives alone. Independent with ADL's , no DME usage PTA.  [name] (Brother) [name] [phone]     PCP: [name]  Action/Plan: Plan is to d/c when medically stable. CM following for d/c disposition needs.  Expected Discharge Date:                  Expected Discharge Plan:  Home/Self Care  In-House Referral:     Discharge planning Services  CM Consult  Post Acute Care Choice:    Choice offered to:     DME Arranged:    DME Agency:     HH Arranged:    HH Agency:     Status of Service:  In process, will continue to follow  If discussed at Long Length of Stay Meetings, dates discussed:    Additional Comments:  Sharin Mons, RN 12/12/2016, 6:58 PM

## 2016-12-12 NOTE — Progress Notes (Addendum)
PROGRESS NOTE    [name]  SNK:[id number] DOB: [birth year] DOA: 12/11/2016 PCP: [name], MD   Brief Narrative: 31 year old female with history of diabetes, ESRD on HD for 10 years and then status post Deceased Donor renal transplant (DDRT) in 2007 at [facility], follows up with Dr. Lorrene Reid outpatient, hypertension, baseline serum creatinine level from 1.4--1.7, noncompliant with the medication as per prior notes, multiple ER visits for nausea vomiting presented with a worsening nausea vomiting and abdominal pain for last 3 days. Patient is admitted for further evaluation.  Assessment & Plan:  # Intractable nausea vomiting and abdominal pain: Exact etiology unknown likely diabetic gastroparesis versus due to Myfortic. CT scan of abdomen and pelvis unremarkable. Lipase level not elevated. Abdomen exam benign. Transplanted kidney site nontender.  -Continue supportive care, tolerating clear liquid diet, -Continue Reglan, Protonix and Zofran as needed. -Given the recurrence of symptoms I consulted GI and spoke with Dr. Michail Sermon.  # ESRD s/p DDRT now with CKD stage 3:  -Patient is currently on Prograf 2 mg twice a day, Myfortic and prednisone 5 mg. Serum creatinine level seems at baseline. UA with mild protein is otherwise unremarkable. Transplanted kidney site nontender on exam. Check Prograf trough level. Monitor BMP. Avoid nephrotoxins. Continue to monitor.  # Insulin-dependent diabetes: Check A1c level. Continue current insulin regimen. Monitor blood sugar level closely.   #Hypertension: Lisinopril and hydrochlorothiazide is currently on hold. Monitor blood pressure.  #Hypokalemia: Replete potassium chloride. Monitor BMP.  DVT prophylaxis: Heparin subcutaneous Code Status: Full code Family Communication: No family at bedside Disposition Plan: Currently admitted    Consultants:   None  Procedures: None Antimicrobials: None  Subjective: Seen and examined at  bedside. Reported mild nausea but no vomiting. Abdomen pain is better. Denied headache, dizziness, chest pain. No dysuria or urgency or frequency. No diarrhea.  Objective: Vitals:   12/12/16 0015 12/12/16 0105 12/12/16 0453 12/12/16 0932  BP: 103/71 133/83 102/63 (!) 135/42  Pulse: 92 99 86 69  Resp: 17 18 18    Temp:  99 F (37.2 C) 98.3 F (36.8 C) 98.1 F (36.7 C)  TempSrc:  Oral Oral Oral  SpO2: 93% 98% 100%   Weight:   80.9 kg (178 lb 6.4 oz)   Height:   5\' 6"  (1.676 m)     Intake/Output Summary (Last 24 hours) at 12/12/16 1407 Last data filed at 12/12/16 0500  Gross per 24 hour  Intake          1596.33 ml  Output              100 ml  Net          1496.33 ml   Filed Weights   12/12/16 0453  Weight: 80.9 kg (178 lb 6.4 oz)    Examination:  General exam: Appears calm and comfortable  Respiratory system: Clear to auscultation. Respiratory effort normal. No wheezing or crackle Cardiovascular system: S1 & S2 heard, RRR.  No pedal edema. Gastrointestinal system: Abdomen is nondistended, soft and nontender. Normal bowel sounds heard.Kidney transplant site nontender Central nervous system: Alert and oriented. No focal neurological deficits. Extremities: Symmetric 5 x 5 power. Skin: No rashes, lesions or ulcers Psychiatry: Judgement and insight appear normal. Mood & affect appropriate.     Data Reviewed: I have personally reviewed following labs and imaging studies  CBC:  Recent Labs Lab 12/09/16 0006 12/09/16 0917 12/10/16 0609 12/11/16 1458 12/12/16 0410  WBC 8.8  --  7.7 10.3 6.8  HGB  10.8* 11.2* 10.7* 11.5* 9.7*  HCT 33.9*  --  34.3* 36.7 30.6*  MCV 87.6  --  88.6 88.4 89.7  PLT 211  --  230 239 564   Basic Metabolic Panel:  Recent Labs Lab 12/09/16 0006 12/10/16 0609 12/11/16 1458 12/12/16 0410  NA 137 138 138 139  K 3.5 3.6 3.0* 3.0*  CL 103 100* 100* 104  CO2 24 29 25 28   GLUCOSE 171* 282* 151* 140*  BUN 27* 19 18 14   CREATININE 1.50* 1.39*  1.61* 1.33*  CALCIUM 7.9* 7.9* 7.7* 6.8*  MG  --   --   --  1.7   GFR: Estimated Creatinine Clearance: 64.5 mL/min (A) (by C-G formula based on SCr of 1.33 mg/dL (H)). Liver Function Tests:  Recent Labs Lab 12/09/16 1330 12/10/16 0609 12/11/16 1458  AST 16 17 21   ALT 14 15 14   ALKPHOS 78 73 65  BILITOT 0.6 0.8 1.0  PROT 7.5 7.3 6.8  ALBUMIN 3.8 3.7 3.6    Recent Labs Lab 12/09/16 1330 12/10/16 0609 12/11/16 1458  LIPASE 30 27 39   No results for input(s): AMMONIA in the last 168 hours. Coagulation Profile: No results for input(s): INR, PROTIME in the last 168 hours. Cardiac Enzymes: No results for input(s): CKTOTAL, CKMB, CKMBINDEX, TROPONINI in the last 168 hours. BNP (last 3 results) No results for input(s): PROBNP in the last 8760 hours. HbA1C: No results for input(s): HGBA1C in the last 72 hours. CBG:  Recent Labs Lab 12/11/16 1458 12/12/16 0107 12/12/16 0559 12/12/16 0740 12/12/16 1236  GLUCAP 151* 194* 100* 102* 138*   Lipid Profile: No results for input(s): CHOL, HDL, LDLCALC, TRIG, CHOLHDL, LDLDIRECT in the last 72 hours. Thyroid Function Tests: No results for input(s): TSH, T4TOTAL, FREET4, T3FREE, THYROIDAB in the last 72 hours. Anemia Panel: No results for input(s): VITAMINB12, FOLATE, FERRITIN, TIBC, IRON, RETICCTPCT in the last 72 hours. Sepsis Labs: No results for input(s): PROCALCITON, LATICACIDVEN in the last 168 hours.  No results found for this or any previous visit (from the past 240 hour(s)).       Radiology Studies: No results found.      Scheduled Meds: . fluticasone  2 spray Each Nare Daily  . heparin  5,000 Units Subcutaneous Q8H  . insulin aspart  0-15 Units Subcutaneous Q4H  . insulin detemir  45 Units Subcutaneous BID  . loratadine  10 mg Oral Daily  . metoCLOPramide (REGLAN) injection  10 mg Intravenous Q8H  . mycophenolate  720 mg Oral BID  . pantoprazole  20 mg Oral Daily  . potassium chloride  40 mEq Oral  BID  . predniSONE  5 mg Oral QHS  . [START ON 12/13/2016] simvastatin  20 mg Oral Once per day on Mon Wed Fri  . sodium chloride flush  3 mL Intravenous Q12H  . tacrolimus  2 mg Oral BID   Continuous Infusions:   LOS: 0 days    Alvy Alsop Tanna Furry, MD [facility] Pager [phone]  If 7PM-7AM, please contact night-coverage [email or web address] Password [email or web address] 12/12/2016, 2:07 PM

## 2016-12-13 ENCOUNTER — Other Ambulatory Visit: Payer: Medicare Other

## 2016-12-13 LAB — RENAL FUNCTION PANEL
ANION GAP: 9 (ref 5–15)
Albumin: 4 g/dL (ref 3.5–5.0)
BUN: 10 mg/dL (ref 6–20)
CHLORIDE: 101 mmol/L (ref 101–111)
CO2: 27 mmol/L (ref 22–32)
Calcium: 7.9 mg/dL — ABNORMAL LOW (ref 8.9–10.3)
Creatinine, Ser: 1.3 mg/dL — ABNORMAL HIGH (ref 0.44–1.00)
GFR calc non Af Amer: 53 mL/min — ABNORMAL LOW (ref >60)
Glucose, Bld: 111 mg/dL — ABNORMAL HIGH (ref 65–99)
Phosphorus: 3.1 mg/dL (ref 2.5–4.6)
Potassium: 4 mmol/L (ref 3.5–5.1)
Sodium: 137 mmol/L (ref 135–145)

## 2016-12-13 LAB — GLUCOSE, CAPILLARY
GLUCOSE-CAPILLARY: 103 mg/dL — AB (ref 65–99)
GLUCOSE-CAPILLARY: 116 mg/dL — AB (ref 65–99)
GLUCOSE-CAPILLARY: 102 mg/dL — AB (ref 65–99)
GLUCOSE-CAPILLARY: 118 mg/dL — AB (ref 65–99)
Glucose-Capillary: 127 mg/dL — ABNORMAL HIGH (ref 65–99)
Glucose-Capillary: 151 mg/dL — ABNORMAL HIGH (ref 65–99)

## 2016-12-13 LAB — CBC
HEMATOCRIT: 38 % (ref 36.0–46.0)
HEMOGLOBIN: 12.4 g/dL (ref 12.0–15.0)
MCH: 29.1 pg (ref 26.0–34.0)
MCHC: 32.6 g/dL (ref 30.0–36.0)
MCV: 89.2 fL (ref 78.0–100.0)
Platelets: 284 10*3/uL (ref 150–400)
RBC: 4.26 MIL/uL (ref 3.87–5.11)
RDW: 14.7 % (ref 11.5–15.5)
WBC: 7 10*3/uL (ref 4.0–10.5)

## 2016-12-13 LAB — MAGNESIUM: MAGNESIUM: 2 mg/dL (ref 1.7–2.4)

## 2016-12-13 MED ORDER — INSULIN DETEMIR 100 UNIT/ML ~~LOC~~ SOLN
45.0000 [IU] | Freq: Two times a day (BID) | SUBCUTANEOUS | Status: DC
  Administered 2016-12-14 – 2016-12-15 (×3): 45 [IU] via SUBCUTANEOUS
  Filled 2016-12-13 (×5): qty 0.45

## 2016-12-13 MED ORDER — INSULIN DETEMIR 100 UNIT/ML ~~LOC~~ SOLN
25.0000 [IU] | Freq: Once | SUBCUTANEOUS | Status: AC
  Administered 2016-12-13: 25 [IU] via SUBCUTANEOUS
  Filled 2016-12-13: qty 0.25

## 2016-12-13 NOTE — Consult Note (Signed)
           [facility] CM Primary Care Navigator  12/13/2016  JUNKO OHAGAN [birth year] [id number]   Went to see patient at the bedside to identify possible discharge needs.  Patient confirms Dr. [name] with [facility] as her primary care provider which is not under [facility] network and not contracted by it.  Encouraged patient to follow-up with primary care provider after discharge.   For questions, please contact:  Dannielle Huh, BSN, RN- [facility] Primary Care Navigator  Telephone: [phone] [place]

## 2016-12-13 NOTE — Progress Notes (Signed)
PROGRESS NOTE    [name]  PYP:[id number] DOB: September 07, 1985 DOA: 12/11/2016 PCP: [name], MD   Brief Narrative: 31 year old female with history of diabetes, ESRD on HD for 10 years and then status post Deceased Donor renal transplant (DDRT) in 2007 at [facility], follows up with Dr. Lorrene Reid outpatient, hypertension, baseline serum creatinine level from 1.3--1.7, noncompliant with the medication as per prior notes, multiple ER visits for nausea vomiting presented with a worsening nausea vomiting and abdominal pain for last 3 days. Patient is admitted for further evaluation.  Assessment & Plan:  # Intractable nausea vomiting and abdominal pain: Exact etiology unknown likely diabetic gastroparesis versus due to Myfortic. CT scan of abdomen and pelvis unremarkable. Lipase level not elevated. Abdomen exam benign. Transplanted kidney site nontender.  -Continue supportive care, patient is still with nausea but no vomiting. Plan to continue liquid diet today. -Continue Reglan, Protonix and Zofran as needed. -Follow-up GIs plan.  # ESRD s/p DDRT now with CKD stage 3:  -Patient is currently on Prograf 2 mg twice a day, Myfortic and prednisone 5 mg. Serum creatinine level seems at baseline. UA with mild protein is otherwise unremarkable. Transplanted kidney site nontender on exam. Follow-up Prograf trough level. Monitor BMP. Avoid nephrotoxins. Continue to monitor.  # Insulin-dependent diabetes: A1c level of 11.5. Blood sugar level in acceptable range in the hospital. Patient likely is noncompliant with the insulin treatment at home. I reduced the dose of Levemir this morning because of reduced oral intake. Continue to monitor blood sugar level closely.  #Hypertension: Lisinopril and hydrochlorothiazide is currently on hold. Monitor blood pressure.  #Hypokalemia: Replete potassium chloride. Monitor BMP.  #Somnolent: I discontinue Valium and fentanyl. Monitor mental status. No focal  deficit.  DVT prophylaxis: Heparin subcutaneous Code Status: Full code Family Communication: No family at bedside Disposition Plan: Currently admitted    Consultants:   None  Procedures: None Antimicrobials: None  Subjective: Seen and examined at bedside. Patient was somnolent. However able to answer questions appropriately. Feels nausea but no vomiting. No chest pain or shortness of breath.  Objective: Vitals:   12/12/16 0932 12/12/16 1531 12/12/16 2041 12/13/16 0529  BP: (!) 135/42 116/84 135/88 (!) 125/92  Pulse: 69 88 95 90  Resp:  16 18 18   Temp: 98.1 F (36.7 C) 98.2 F (36.8 C) 98.2 F (36.8 C) 98.1 F (36.7 C)  TempSrc: Oral Oral Oral Oral  SpO2:  96% 95% 96%  Weight:    81.1 kg (178 lb 12.8 oz)  Height:        Intake/Output Summary (Last 24 hours) at 12/13/16 1540 Last data filed at 12/12/16 2209  Gross per 24 hour  Intake                3 ml  Output              650 ml  Net             -647 ml   Filed Weights   12/12/16 0453 12/13/16 0529  Weight: 80.9 kg (178 lb 6.4 oz) 81.1 kg (178 lb 12.8 oz)    Examination:  General exam: Somnolent but able to answer appropriately  Respiratory system: Clear bilaterally, respiratory effort normal Cardiovascular system: Regular rate rhythm, S1-S2 normal. No pedal edema Gastrointestinal system: Abdomen soft, nontender, nondistended. Bowel sound positive.  Central nervous system: Alert and oriented. No focal neurological deficits. Extremities: Symmetric 5 x 5 power. Skin: No rashes, lesions or ulcers  Data Reviewed: I have personally reviewed following labs and imaging studies  CBC:  Recent Labs Lab 12/09/16 0006 12/09/16 0917 12/10/16 0609 12/11/16 1458 12/12/16 0410 12/13/16 0756  WBC 8.8  --  7.7 10.3 6.8 7.0  HGB 10.8* 11.2* 10.7* 11.5* 9.7* 12.4  HCT 33.9*  --  34.3* 36.7 30.6* 38.0  MCV 87.6  --  88.6 88.4 89.7 89.2  PLT 211  --  230 239 201 779   Basic Metabolic Panel:  Recent  Labs Lab 12/09/16 0006 12/10/16 0609 12/11/16 1458 12/12/16 0410 12/13/16 0756  NA 137 138 138 139 137  K 3.5 3.6 3.0* 3.0* 4.0  CL 103 100* 100* 104 101  CO2 24 29 25 28 27   GLUCOSE 171* 282* 151* 140* 111*  BUN 27* 19 18 14 10   CREATININE 1.50* 1.39* 1.61* 1.33* 1.30*  CALCIUM 7.9* 7.9* 7.7* 6.8* 7.9*  MG  --   --   --  1.7 2.0  PHOS  --   --   --   --  3.1   GFR: Estimated Creatinine Clearance: 66.1 mL/min (A) (by C-G formula based on SCr of 1.3 mg/dL (H)). Liver Function Tests:  Recent Labs Lab 12/09/16 1330 12/10/16 0609 12/11/16 1458 12/13/16 0756  AST 16 17 21   --   ALT 14 15 14   --   ALKPHOS 78 73 65  --   BILITOT 0.6 0.8 1.0  --   PROT 7.5 7.3 6.8  --   ALBUMIN 3.8 3.7 3.6 4.0    Recent Labs Lab 12/09/16 1330 12/10/16 0609 12/11/16 1458  LIPASE 30 27 39   No results for input(s): AMMONIA in the last 168 hours. Coagulation Profile: No results for input(s): INR, PROTIME in the last 168 hours. Cardiac Enzymes: No results for input(s): CKTOTAL, CKMB, CKMBINDEX, TROPONINI in the last 168 hours. BNP (last 3 results) No results for input(s): PROBNP in the last 8760 hours. HbA1C:  Recent Labs  12/12/16 1438  HGBA1C 11.5*   CBG:  Recent Labs Lab 12/12/16 2039 12/13/16 0035 12/13/16 0527 12/13/16 0738 12/13/16 1218  GLUCAP 81 127* 103* 116* 151*   Lipid Profile: No results for input(s): CHOL, HDL, LDLCALC, TRIG, CHOLHDL, LDLDIRECT in the last 72 hours. Thyroid Function Tests: No results for input(s): TSH, T4TOTAL, FREET4, T3FREE, THYROIDAB in the last 72 hours. Anemia Panel: No results for input(s): VITAMINB12, FOLATE, FERRITIN, TIBC, IRON, RETICCTPCT in the last 72 hours. Sepsis Labs: No results for input(s): PROCALCITON, LATICACIDVEN in the last 168 hours.  No results found for this or any previous visit (from the past 240 hour(s)).       Radiology Studies: No results found.      Scheduled Meds: . fluticasone  2 spray Each  Nare Daily  . heparin  5,000 Units Subcutaneous Q8H  . insulin aspart  0-15 Units Subcutaneous Q4H  . insulin detemir  25 Units Subcutaneous Once  . [START ON 12/14/2016] insulin detemir  45 Units Subcutaneous BID  . loratadine  10 mg Oral Daily  . metoCLOPramide (REGLAN) injection  10 mg Intravenous Q8H  . mycophenolate  720 mg Oral BID  . pantoprazole (PROTONIX) IV  40 mg Intravenous Daily  . potassium chloride  40 mEq Oral BID  . predniSONE  5 mg Oral QHS  . simvastatin  20 mg Oral Once per day on Mon Wed Fri  . sodium chloride flush  3 mL Intravenous Q12H  . tacrolimus  2 mg Oral BID   Continuous  Infusions:   LOS: 1 day    Masai Kidd Tanna Furry, MD [facility] Pager [phone]  If 7PM-7AM, please contact night-coverage [email or web address] Password [email or web address] 12/13/2016, 3:40 PM

## 2016-12-13 NOTE — Progress Notes (Signed)
Inpatient Diabetes Program Recommendations  AACE/ADA: New Consensus Statement on Inpatient Glycemic Control (2015)  Target Ranges:  Prepandial:   less than 140 mg/dL      Peak postprandial:   less than 180 mg/dL (1-2 hours)      Critically ill patients:  140 - 180 mg/dL   Lab Results  Component Value Date   GLUCAP 116 (H) 12/13/2016   HGBA1C 11.5 (H) 12/12/2016    Review of Glycemic Control  Diabetes history: DM1 Outpatient Diabetes medications: Tresiba 130 units QAM, Fiasp 15 units QAM Current orders for Inpatient glycemic control: Levemir 45 units bid, Novolog 0-15 units Q4H  Spoke with pt regarding her diabetes control at home. Pt states last HgbA1C was around 12 or so. Pt states she is nauseated and has a bag that she's spitting in. Said her poor control is from eating sweets and desserts. When asked what we could do to help keep her out of the hospital, she replied "Nothing." Discussed importance of reducing HgbA1C to decrease risk of complications of diabetes.   Inpatient Diabetes Program Recommendations:    Continue same. Blood sugars controlled. Will need to f/u with PCP after discharge.   Thank you. Lorenda Peck, RD, LDN, CDE Inpatient Diabetes Coordinator [phone]

## 2016-12-13 NOTE — Progress Notes (Signed)
Subjective: Nausea, vomiting, pain slowly improving.  Objective: Vital signs in last 24 hours: Temp:  [98.1 F (36.7 C)-98.2 F (36.8 C)] 98.1 F (36.7 C) (10/01 0529) Pulse Rate:  [88-95] 90 (10/01 0529) Resp:  [16-18] 18 (10/01 0529) BP: (116-135)/(84-92) 125/92 (10/01 0529) SpO2:  [95 %-96 %] 96 % (10/01 0529) Weight:  [178 lb 12.8 oz (81.1 kg)] 178 lb 12.8 oz (81.1 kg) (10/01 0529) Weight change: 6.4 oz (0.181 kg) Last BM Date: 12/11/16  PE: GEN:  Overweight ABD: Protuberant, soft, mild periumbilical tenderness  Lab Results: CBC    Component Value Date/Time   WBC 7.0 12/13/2016 0756   RBC 4.26 12/13/2016 0756   HGB 12.4 12/13/2016 0756   HCT 38.0 12/13/2016 0756   PLT 284 12/13/2016 0756   PLT 206 07/17/2009   MCV 89.2 12/13/2016 0756   MCH 29.1 12/13/2016 0756   MCHC 32.6 12/13/2016 0756   RDW 14.7 12/13/2016 0756   LYMPHSABS 2.0 06/16/2016 1637   MONOABS 0.5 06/16/2016 1637   EOSABS 0.2 06/16/2016 1637   BASOSABS 0.0 06/16/2016 1637   CMP     Component Value Date/Time   NA 137 12/13/2016 0756   K 4.0 12/13/2016 0756   CL 101 12/13/2016 0756   CO2 27 12/13/2016 0756   GLUCOSE 111 (H) 12/13/2016 0756   BUN 10 12/13/2016 0756   CREATININE 1.30 (H) 12/13/2016 0756   CALCIUM 7.9 (L) 12/13/2016 0756   PROT 6.8 12/11/2016 1458   ALBUMIN 4.0 12/13/2016 0756   AST 21 12/11/2016 1458   ALT 14 12/11/2016 1458   ALKPHOS 65 12/11/2016 1458   BILITOT 1.0 12/11/2016 1458   GFRNONAA 53 (L) 12/13/2016 0756   GFRAA >60 12/13/2016 0756   Assessment:  1.  Nausea and vomiting. 2.  Abdominal pain. 3.  Poorly controlled diabetes. 4.  Overall constellation of symptoms most consistent with either acute gastroenteritis versus diabetic gastroparesis.  Plan:  1.  Full liquid diet. 2.  Continue metoclopramide. 3.  Optimize diabetic control. 4.  Don't see need for further GI testing at the present time, unless symptoms persist/worsen over the next couple days. 5.   Will follow.   [name] 12/13/2016, 1:03 PM   Cell [phone] If no answer or after 5 PM call [phone]

## 2016-12-14 DIAGNOSIS — N179 Acute kidney failure, unspecified: Secondary | ICD-10-CM

## 2016-12-14 DIAGNOSIS — N189 Chronic kidney disease, unspecified: Secondary | ICD-10-CM

## 2016-12-14 LAB — BASIC METABOLIC PANEL
Anion gap: 12 (ref 5–15)
BUN: 14 mg/dL (ref 6–20)
CALCIUM: 8.1 mg/dL — AB (ref 8.9–10.3)
CO2: 27 mmol/L (ref 22–32)
CREATININE: 1.66 mg/dL — AB (ref 0.44–1.00)
Chloride: 98 mmol/L — ABNORMAL LOW (ref 101–111)
GFR calc Af Amer: 46 mL/min — ABNORMAL LOW (ref >60)
GFR calc non Af Amer: 40 mL/min — ABNORMAL LOW (ref >60)
GLUCOSE: 114 mg/dL — AB (ref 65–99)
Potassium: 4 mmol/L (ref 3.5–5.1)
Sodium: 137 mmol/L (ref 135–145)

## 2016-12-14 LAB — GLUCOSE, CAPILLARY
GLUCOSE-CAPILLARY: 137 mg/dL — AB (ref 65–99)
GLUCOSE-CAPILLARY: 126 mg/dL — AB (ref 65–99)
Glucose-Capillary: 113 mg/dL — ABNORMAL HIGH (ref 65–99)
Glucose-Capillary: 120 mg/dL — ABNORMAL HIGH (ref 65–99)
Glucose-Capillary: 119 mg/dL — ABNORMAL HIGH (ref 65–99)
Glucose-Capillary: 128 mg/dL — ABNORMAL HIGH (ref 65–99)

## 2016-12-14 LAB — TACROLIMUS LEVEL: TACROLIMUS (FK506) - LABCORP: 7 ng/mL (ref 2.0–20.0)

## 2016-12-14 MED ORDER — PANTOPRAZOLE SODIUM 40 MG PO TBEC
40.0000 mg | DELAYED_RELEASE_TABLET | Freq: Every day | ORAL | Status: DC
  Filled 2016-12-14: qty 1

## 2016-12-14 MED ORDER — SODIUM CHLORIDE 0.9 % IV SOLN
INTRAVENOUS | Status: DC
  Administered 2016-12-14 – 2016-12-15 (×4): via INTRAVENOUS

## 2016-12-14 MED ORDER — METOCLOPRAMIDE HCL 10 MG PO TABS
5.0000 mg | ORAL_TABLET | Freq: Three times a day (TID) | ORAL | Status: DC
  Administered 2016-12-14 – 2016-12-15 (×3): 5 mg via ORAL
  Filled 2016-12-14 (×5): qty 1

## 2016-12-14 NOTE — Progress Notes (Signed)
PROGRESS NOTE    [name]  KKX:[id number] DOB: 11-23-1985 DOA: 12/11/2016 PCP: [name], MD   Brief Narrative: 31 year old female with history of diabetes, ESRD on HD for 10 years and then status post Deceased Donor renal transplant (DDRT) in 2007 at [facility], follows up with Dr. Lorrene Reid outpatient, hypertension, baseline serum creatinine level from 1.3--1.7, noncompliant with the medication as per prior notes, multiple ER visits for nausea vomiting presented with a worsening nausea vomiting and abdominal pain for last 3 days. Patient is admitted for further evaluation.  Assessment & Plan:  # Intractable nausea vomiting and abdominal pain: Exact etiology unknown likely diabetic gastroparesis versus due to Myfortic. CT scan of abdomen and pelvis unremarkable. Lipase level not elevated. Abdomen exam benign. Transplanted kidney site nontender.  -Abdominal pain is improved however he still has nausea and reported vomiting. The diet was advanced to soft by GI. Continue supportive care. -Currently on Reglan, Protonix and Zofran as needed. -Follow-up GIs plan.  # ESRD s/p DDRT now with  Acute Kidney injury on CKD stage 3:  -Patient is currently on Prograf 2 mg twice a day, Myfortic and prednisone 5 mg. Serum creatinine level elevated today likely due to prerenal in the setting of vomiting and decreased oral intake. Denies urinary symptoms. Starting IV fluid today. Repeat BMP in the morning. -UA with mild protein is otherwise unremarkable. Transplanted kidney site nontender on exam.  -Follow-up Prograf trough level, still pending.  - Avoid nephrotoxins. Continue to monitor.  # Insulin-dependent diabetes: A1c level of 11.5. Blood sugar level acceptable in the hospital. Patient likely is noncompliant with the insulin treatment at home. Continue current insulin regimen. Monitor blood sugar level.  #Hypertension: Lisinopril and hydrochlorothiazide is currently on hold. Monitor blood  pressure.  #Hypokalemia: Improved now.  #Somnolent: Mentally status improved today.  Discontinued Valium and fentanyl.  DVT prophylaxis: Heparin subcutaneous Code Status: Full code Family Communication: No family at bedside Disposition Plan: Currently admitted    Consultants:   None  Procedures: None Antimicrobials: None  Subjective: Seen and examined at bedside. Still having nausea and vomiting but reported abdomen pain is better. Denied chest pain or shortness of breath. Denies dysuria or urgency or frequency.  Objective: Vitals:   12/13/16 1605 12/13/16 2048 12/14/16 0454 12/14/16 0500  BP: (!) 138/92 (!) 144/97 121/90   Pulse: 100 (!) 101 (!) 108   Resp:  16 18   Temp: 98.4 F (36.9 C) 98.5 F (36.9 C) 98.2 F (36.8 C)   TempSrc: Oral Oral Oral   SpO2: 99% 97% 98%   Weight:    77.6 kg (171 lb)  Height:       No intake or output data in the 24 hours ending 12/14/16 1148 Filed Weights   12/12/16 0453 12/13/16 0529 12/14/16 0500  Weight: 80.9 kg (178 lb 6.4 oz) 81.1 kg (178 lb 12.8 oz) 77.6 kg (171 lb)    Examination:  General exam: Lying on bed comfortable, not in distress Respiratory system: Clear bilateral, respiratory effort normal Cardiovascular system: Regular rate and rhythm, S1-S2 normal. No pedal edema Gastrointestinal system: Abdomen soft, nontender, nondistended. Bowel sound positive. Kidneys transplanted site nontender Central nervous system: Alert awake and oriented. No focal neurological deficit Skin: No rashes, lesions or ulcers    Data Reviewed: I have personally reviewed following labs and imaging studies  CBC:  Recent Labs Lab 12/09/16 0006 12/09/16 0917 12/10/16 0609 12/11/16 1458 12/12/16 0410 12/13/16 0756  WBC 8.8  --  7.7  10.3 6.8 7.0  HGB 10.8* 11.2* 10.7* 11.5* 9.7* 12.4  HCT 33.9*  --  34.3* 36.7 30.6* 38.0  MCV 87.6  --  88.6 88.4 89.7 89.2  PLT 211  --  230 239 201 161   Basic Metabolic Panel:  Recent Labs Lab  12/10/16 0609 12/11/16 1458 12/12/16 0410 12/13/16 0756 12/14/16 0921  NA 138 138 139 137 137  K 3.6 3.0* 3.0* 4.0 4.0  CL 100* 100* 104 101 98*  CO2 29 25 28 27 27   GLUCOSE 282* 151* 140* 111* 114*  BUN 19 18 14 10 14   CREATININE 1.39* 1.61* 1.33* 1.30* 1.66*  CALCIUM 7.9* 7.7* 6.8* 7.9* 8.1*  MG  --   --  1.7 2.0  --   PHOS  --   --   --  3.1  --    GFR: Estimated Creatinine Clearance: 50.7 mL/min (A) (by C-G formula based on SCr of 1.66 mg/dL (H)). Liver Function Tests:  Recent Labs Lab 12/09/16 1330 12/10/16 0609 12/11/16 1458 12/13/16 0756  AST 16 17 21   --   ALT 14 15 14   --   ALKPHOS 78 73 65  --   BILITOT 0.6 0.8 1.0  --   PROT 7.5 7.3 6.8  --   ALBUMIN 3.8 3.7 3.6 4.0    Recent Labs Lab 12/09/16 1330 12/10/16 0609 12/11/16 1458  LIPASE 30 27 39   No results for input(s): AMMONIA in the last 168 hours. Coagulation Profile: No results for input(s): INR, PROTIME in the last 168 hours. Cardiac Enzymes: No results for input(s): CKTOTAL, CKMB, CKMBINDEX, TROPONINI in the last 168 hours. BNP (last 3 results) No results for input(s): PROBNP in the last 8760 hours. HbA1C:  Recent Labs  12/12/16 1438  HGBA1C 11.5*   CBG:  Recent Labs Lab 12/13/16 1709 12/13/16 2048 12/14/16 0106 12/14/16 0451 12/14/16 0746  GLUCAP 102* 118* 137* 126* 113*   Lipid Profile: No results for input(s): CHOL, HDL, LDLCALC, TRIG, CHOLHDL, LDLDIRECT in the last 72 hours. Thyroid Function Tests: No results for input(s): TSH, T4TOTAL, FREET4, T3FREE, THYROIDAB in the last 72 hours. Anemia Panel: No results for input(s): VITAMINB12, FOLATE, FERRITIN, TIBC, IRON, RETICCTPCT in the last 72 hours. Sepsis Labs: No results for input(s): PROCALCITON, LATICACIDVEN in the last 168 hours.  No results found for this or any previous visit (from the past 240 hour(s)).       Radiology Studies: No results found.      Scheduled Meds: . fluticasone  2 spray Each Nare Daily   . heparin  5,000 Units Subcutaneous Q8H  . insulin aspart  0-15 Units Subcutaneous Q4H  . insulin detemir  45 Units Subcutaneous BID  . loratadine  10 mg Oral Daily  . metoCLOPramide  5 mg Oral TID AC & HS  . mycophenolate  720 mg Oral BID  . pantoprazole (PROTONIX) IV  40 mg Intravenous Daily  . potassium chloride  40 mEq Oral BID  . predniSONE  5 mg Oral QHS  . simvastatin  20 mg Oral Once per day on Mon Wed Fri  . sodium chloride flush  3 mL Intravenous Q12H  . tacrolimus  2 mg Oral BID   Continuous Infusions: . sodium chloride 100 mL/hr at 12/14/16 1055     LOS: 2 days    Aristide Waggle Tanna Furry, MD [facility] Pager [phone]  If 7PM-7AM, please contact night-coverage [email or web address] Password [email or web address] 12/14/2016, 11:48 AM

## 2016-12-14 NOTE — Progress Notes (Signed)
Subjective: Continues to improve (but slowly). Tolerated full liquid diet fairly well.  Objective: Vital signs in last 24 hours: Temp:  [98.2 F (36.8 C)-98.5 F (36.9 C)] 98.2 F (36.8 C) (10/02 0454) Pulse Rate:  [100-108] 108 (10/02 0454) Resp:  [16-18] 18 (10/02 0454) BP: (121-144)/(90-97) 121/90 (10/02 0454) SpO2:  [97 %-99 %] 98 % (10/02 0454) Weight:  [171 lb (77.6 kg)] 171 lb (77.6 kg) (10/02 0500) Weight change: -7 lb 12.8 oz (-3.538 kg) Last BM Date: 12/11/16  PE: GEN:  Overweight, NAD ABD:  Protuberant, soft, non-tender  Lab Results: CBC    Component Value Date/Time   WBC 7.0 12/13/2016 0756   RBC 4.26 12/13/2016 0756   HGB 12.4 12/13/2016 0756   HCT 38.0 12/13/2016 0756   PLT 284 12/13/2016 0756   PLT 206 07/17/2009   MCV 89.2 12/13/2016 0756   MCH 29.1 12/13/2016 0756   MCHC 32.6 12/13/2016 0756   RDW 14.7 12/13/2016 0756   LYMPHSABS 2.0 06/16/2016 1637   MONOABS 0.5 06/16/2016 1637   EOSABS 0.2 06/16/2016 1637   BASOSABS 0.0 06/16/2016 1637   CMP     Component Value Date/Time   NA 137 12/14/2016 0921   K 4.0 12/14/2016 0921   CL 98 (L) 12/14/2016 0921   CO2 27 12/14/2016 0921   GLUCOSE 114 (H) 12/14/2016 0921   BUN 14 12/14/2016 0921   CREATININE 1.66 (H) 12/14/2016 0921   CALCIUM 8.1 (L) 12/14/2016 0921   PROT 6.8 12/11/2016 1458   ALBUMIN 4.0 12/13/2016 0756   AST 21 12/11/2016 1458   ALT 14 12/11/2016 1458   ALKPHOS 65 12/11/2016 1458   BILITOT 1.0 12/11/2016 1458   GFRNONAA 40 (L) 12/14/2016 0921   GFRAA 46 (L) 12/14/2016 0921   Assessment:  1.  Nausea and vomiting. 2.  Abdominal pain. 3.  Poorly controlled diabetes. 4.  Overall constellation of symptoms most consistent with either acute gastroenteritis versus diabetic gastroparesis.  Overall patient is steadily (albeit slowly) improving.  Plan:  1.  Advance to soft diet. 2.  Switch to po metoclopramide. 3.  Switch to po pantoprazole (can't figure out how myself to order that  on EPIC). 4.  Hopeful discharge home tomorrow; advised patient that complete resolution of nausea is not feasible endpoint for hospital discharge and, that as long as she can tolerate diet without vomiting and without significant abdominal pain, I feel she could likely be discharged tomorrow. 5.  [facility] will follow.   Landry Dyke 12/14/2016, 11:10 AM   Cell [phone] If no answer or after 5 PM call [phone]

## 2016-12-15 DIAGNOSIS — R112 Nausea with vomiting, unspecified: Secondary | ICD-10-CM

## 2016-12-15 DIAGNOSIS — N189 Chronic kidney disease, unspecified: Secondary | ICD-10-CM

## 2016-12-15 DIAGNOSIS — N179 Acute kidney failure, unspecified: Secondary | ICD-10-CM

## 2016-12-15 LAB — GLUCOSE, CAPILLARY
GLUCOSE-CAPILLARY: 60 mg/dL — AB (ref 65–99)
GLUCOSE-CAPILLARY: 79 mg/dL (ref 65–99)
GLUCOSE-CAPILLARY: 83 mg/dL (ref 65–99)
GLUCOSE-CAPILLARY: 86 mg/dL (ref 65–99)
GLUCOSE-CAPILLARY: 87 mg/dL (ref 65–99)
Glucose-Capillary: 127 mg/dL — ABNORMAL HIGH (ref 65–99)
Glucose-Capillary: 57 mg/dL — ABNORMAL LOW (ref 65–99)
Glucose-Capillary: 70 mg/dL (ref 65–99)
Glucose-Capillary: 73 mg/dL (ref 65–99)
Glucose-Capillary: 86 mg/dL (ref 65–99)

## 2016-12-15 LAB — BASIC METABOLIC PANEL
Anion gap: 13 (ref 5–15)
BUN: 22 mg/dL — AB (ref 6–20)
CALCIUM: 7.4 mg/dL — AB (ref 8.9–10.3)
CO2: 23 mmol/L (ref 22–32)
CREATININE: 1.95 mg/dL — AB (ref 0.44–1.00)
Chloride: 100 mmol/L — ABNORMAL LOW (ref 101–111)
GFR calc non Af Amer: 33 mL/min — ABNORMAL LOW (ref >60)
GFR, EST AFRICAN AMERICAN: 38 mL/min — AB (ref >60)
Glucose, Bld: 179 mg/dL — ABNORMAL HIGH (ref 65–99)
Potassium: 4.7 mmol/L (ref 3.5–5.1)
SODIUM: 136 mmol/L (ref 135–145)

## 2016-12-15 LAB — MAGNESIUM: Magnesium: 1.6 mg/dL — ABNORMAL LOW (ref 1.7–2.4)

## 2016-12-15 MED ORDER — AMLODIPINE BESYLATE 5 MG PO TABS
5.0000 mg | ORAL_TABLET | Freq: Every day | ORAL | Status: DC
  Filled 2016-12-15 (×2): qty 1

## 2016-12-15 MED ORDER — DEXTROSE-NACL 5-0.45 % IV SOLN
INTRAVENOUS | Status: DC
  Administered 2016-12-16 – 2016-12-17 (×3): via INTRAVENOUS

## 2016-12-15 MED ORDER — INSULIN DETEMIR 100 UNIT/ML ~~LOC~~ SOLN: 25.0000 [IU] | Freq: Once | SUBCUTANEOUS | Status: DC

## 2016-12-15 NOTE — Progress Notes (Signed)
Hypoglycemic Event  CBG: 57  Treatment: 15 GM carbohydrate snack  Symptoms: None  Follow-up CBG: Time:0531 CBG Result:83  Possible Reasons for Event: Inadequate meal intake  Comments/MD notified:Yes    [name] N [name]

## 2016-12-15 NOTE — Progress Notes (Signed)
Subjective: Reports intermittent vomiting. Abdominal pain slowly improving.  Objective: Vital signs in last 24 hours: Temp:  [97.7 F (36.5 C)] 97.7 F (36.5 C) (10/03 0508) Pulse Rate:  [89-109] 89 (10/03 0508) Resp:  [16-20] 20 (10/03 0508) BP: (112-129)/(79-89) 112/79 (10/03 0508) SpO2:  [87 %-97 %] 97 % (10/03 0508) Weight:  [174 lb 9.6 oz (79.2 kg)] 174 lb 9.6 oz (79.2 kg) (10/03 0500) Weight change: 3 lb 9.6 oz (1.633 kg) Last BM Date: 12/11/16  PE: GEN:  NAD, overweight, flat affect ABD:  Mild epigastric tenderness, no peritonitis  Lab Results: CBC    Component Value Date/Time   WBC 7.0 12/13/2016 0756   RBC 4.26 12/13/2016 0756   HGB 12.4 12/13/2016 0756   HCT 38.0 12/13/2016 0756   PLT 284 12/13/2016 0756   PLT 206 07/17/2009   MCV 89.2 12/13/2016 0756   MCH 29.1 12/13/2016 0756   MCHC 32.6 12/13/2016 0756   RDW 14.7 12/13/2016 0756   LYMPHSABS 2.0 06/16/2016 1637   MONOABS 0.5 06/16/2016 1637   EOSABS 0.2 06/16/2016 1637   BASOSABS 0.0 06/16/2016 1637   CMP     Component Value Date/Time   NA 136 12/15/2016 0654   K 4.7 12/15/2016 0654   CL 100 (L) 12/15/2016 0654   CO2 23 12/15/2016 0654   GLUCOSE 179 (H) 12/15/2016 0654   BUN 22 (H) 12/15/2016 0654   CREATININE 1.95 (H) 12/15/2016 0654   CALCIUM 7.4 (L) 12/15/2016 0654   PROT 6.8 12/11/2016 1458   ALBUMIN 4.0 12/13/2016 0756   AST 21 12/11/2016 1458   ALT 14 12/11/2016 1458   ALKPHOS 65 12/11/2016 1458   BILITOT 1.0 12/11/2016 1458   GFRNONAA 33 (L) 12/15/2016 0654   GFRAA 38 (L) 12/15/2016 0654   Assessment:  1. Nausea and vomiting. 2. Abdominal pain. 3. Poorly controlled diabetes. 4. Overall constellation of symptoms most consistent with either acute gastroenteritis versus diabetic gastroparesis.  Overall patient is steadily (albeit slowly) improving.  Plan:  1.  Continue metoclopramide and pantoprazole an dondansetron prn. 2.  Gastric emptying study; pending these results, might  consider endoscopy. 3.  [facility] will follow.   Landry Dyke 12/15/2016, 10:03 AM   Cell [phone] If no answer or after 5 PM call [phone]

## 2016-12-15 NOTE — Progress Notes (Signed)
PROGRESS NOTE [facility]   [name]   YBO:[id number] DOB: 03-30-1985  DOA: 12/11/2016 PCP: [name], MD   Brief Narrative:  [name] he said 31 year old female with history of diabetes, end-stage renal disease on hemodialysis for 10 years and then status post DDRT in 2007, hypertension. Presented to the emergency department complaining of vomiting and abdominal pain. Patient was admitted for working diagnosis of possible gastroparesis. GI was consulted and planning for gastric emptying study.   Subjective: Mild abdominal pain, c/o intermittent nausea and vomiting. No acute distress.   Assessment & Plan: Intractable nausea vomiting and abdominal pain - unknown etiology at this time, concern of diabetic gastroparesis. CT abd/pelvis unremarkable. Lipase normal.  GI consulted - recommending gastric emptying study - may be need EGD  Continue Reglan, Zofran and Protonix.   ESRD s/p kidney transplant, now with AKI  Patient currently on Prograf 2 mg BID Myfortic and prednisone 5 mg Cr increase likely in setting of n/v and HTN - pre-renal, continue IVF  Avoid nephrotoxins  Continue to monitor   DM type II insulin dependent  A1C 11.5, CBG stable, patient non compliant with medication at home. Continue current insulin regimen. Monitor CBG's   HTN  BP above goal, likely contributing to AKI  Lisinopril/HCTZ on hold  Will start Amlodipine  Continue hydralazine PRN   DVT prophylaxis: heparin  Code Status: Full code  Family Communication: none at bedside  Disposition Plan: Home when medically stable   Consultants:   GI   Procedures:   None   Antimicrobials: Anti-infectives    None       Objective: Vitals:   12/15/16 0500 12/15/16 0508 12/15/16 1500 12/15/16 1505  BP:  112/79 (!) 154/100 (!) 151/106  Pulse:  89 98   Resp:  20 18   Temp:  97.7 F (36.5 C) 98.7 F (37.1 C)   TempSrc:  Oral Oral   SpO2:  97% 98%   Weight: 79.2 kg (174 lb 9.6 oz)      Height:        Intake/Output Summary (Last 24 hours) at 12/15/16 1748 Last data filed at 12/15/16 1300  Gross per 24 hour  Intake          1201.67 ml  Output              400 ml  Net           801.67 ml   Filed Weights   12/13/16 0529 12/14/16 0500 12/15/16 0500  Weight: 81.1 kg (178 lb 12.8 oz) 77.6 kg (171 lb) 79.2 kg (174 lb 9.6 oz)    Examination:  General exam: Appears calm and comfortable  HEENT: AC/AT, PERRLA, OP moist and clear Respiratory system: Clear to auscultation. No wheezes,crackle or rhonchi Cardiovascular system: S1 & S2 heard, RRR. No murmurs, rubs or gallops Gastrointestinal system: Abdomen is nondistended, soft and nontender. Normal bowel sounds heard. Central nervous system: Alert and oriented. No focal neurological deficits. Extremities: No pedal edema. Skin: No rashes, lesions or ulcers Psychiatry: Judgement and insight appear normal. Mood & affect appropriate.    Data Reviewed: I have personally reviewed following labs and imaging studies  CBC:  Recent Labs Lab 12/09/16 0006 12/09/16 0917 12/10/16 0609 12/11/16 1458 12/12/16 0410 12/13/16 0756  WBC 8.8  --  7.7 10.3 6.8 7.0  HGB 10.8* 11.2* 10.7* 11.5* 9.7* 12.4  HCT 33.9*  --  34.3* 36.7 30.6* 38.0  MCV 87.6  --  88.6 88.4  89.7 89.2  PLT 211  --  230 239 201 409   Basic Metabolic Panel:  Recent Labs Lab 12/11/16 1458 12/12/16 0410 12/13/16 0756 12/14/16 0921 12/15/16 0654  NA 138 139 137 137 136  K 3.0* 3.0* 4.0 4.0 4.7  CL 100* 104 101 98* 100*  CO2 25 28 27 27 23   GLUCOSE 151* 140* 111* 114* 179*  BUN 18 14 10 14  22*  CREATININE 1.61* 1.33* 1.30* 1.66* 1.95*  CALCIUM 7.7* 6.8* 7.9* 8.1* 7.4*  MG  --  1.7 2.0  --  1.6*  PHOS  --   --  3.1  --   --    GFR: Estimated Creatinine Clearance: 43.6 mL/min (A) (by C-G formula based on SCr of 1.95 mg/dL (H)). Liver Function Tests:  Recent Labs Lab 12/09/16 1330 12/10/16 0609 12/11/16 1458 12/13/16 0756  AST 16 17 21   --     ALT 14 15 14   --   ALKPHOS 78 73 65  --   BILITOT 0.6 0.8 1.0  --   PROT 7.5 7.3 6.8  --   ALBUMIN 3.8 3.7 3.6 4.0    Recent Labs Lab 12/09/16 1330 12/10/16 0609 12/11/16 1458  LIPASE 30 27 39   No results for input(s): AMMONIA in the last 168 hours. Coagulation Profile: No results for input(s): INR, PROTIME in the last 168 hours. Cardiac Enzymes: No results for input(s): CKTOTAL, CKMB, CKMBINDEX, TROPONINI in the last 168 hours. BNP (last 3 results) No results for input(s): PROBNP in the last 8760 hours. HbA1C: No results for input(s): HGBA1C in the last 72 hours. CBG:  Recent Labs Lab 12/15/16 0506 12/15/16 0532 12/15/16 0837 12/15/16 1221 12/15/16 1313  GLUCAP 57* 83 127* 60* 86   Lipid Profile: No results for input(s): CHOL, HDL, LDLCALC, TRIG, CHOLHDL, LDLDIRECT in the last 72 hours. Thyroid Function Tests: No results for input(s): TSH, T4TOTAL, FREET4, T3FREE, THYROIDAB in the last 72 hours. Anemia Panel: No results for input(s): VITAMINB12, FOLATE, FERRITIN, TIBC, IRON, RETICCTPCT in the last 72 hours. Sepsis Labs: No results for input(s): PROCALCITON, LATICACIDVEN in the last 168 hours.  No results found for this or any previous visit (from the past 240 hour(s)).    Radiology Studies: No results found.   Scheduled Meds: . fluticasone  2 spray Each Nare Daily  . heparin  5,000 Units Subcutaneous Q8H  . insulin aspart  0-15 Units Subcutaneous Q4H  . insulin detemir  45 Units Subcutaneous BID  . loratadine  10 mg Oral Daily  . metoCLOPramide  5 mg Oral TID AC & HS  . mycophenolate  720 mg Oral BID  . pantoprazole  40 mg Oral Daily  . potassium chloride  40 mEq Oral BID  . predniSONE  5 mg Oral QHS  . simvastatin  20 mg Oral Once per day on Mon Wed Fri  . sodium chloride flush  3 mL Intravenous Q12H  . tacrolimus  2 mg Oral BID   Continuous Infusions: . sodium chloride 100 mL/hr at 12/15/16 1011     LOS: 3 days   Time spent: Total of 15  minutes spent with pt, greater than 50% of which was spent in discussion of  treatment, counseling and coordination of care    Chipper Oman, MD Pager: Text Page via [email or web address]   If 7PM-7AM, please contact night-coverage [email or web address] 12/15/2016, 5:48 PM

## 2016-12-15 NOTE — Progress Notes (Signed)
Hypoglycemic Event  CBG: 60   Treatment: 15 GM carbohydrate snack  Symptoms: None  Follow-up CBG: Time:1313 CBG Result:86 Possible Reasons for Event: Vomiting and Inadequate meal intake     [name]

## 2016-12-15 NOTE — Progress Notes (Signed)
Inpatient Diabetes Program Recommendations  AACE/ADA: New Consensus Statement on Inpatient Glycemic Control (2015)  Target Ranges:  Prepandial:   less than 140 mg/dL      Peak postprandial:   less than 180 mg/dL (1-2 hours)      Critically ill patients:  140 - 180 mg/dL   Lab Results  Component Value Date   GLUCAP 60 (L) 12/15/2016   HGBA1C 11.5 (H) 12/12/2016    Review of Glycemic Control  Hypoglycemia x 2 today. Needs insulin adjustment.  Inpatient Diabetes Program Recommendations:   Decrease Levemir to 40 units bid.  Will continue to follow.  Thank you. Lorenda Peck, RD, LDN, CDE Inpatient Diabetes Coordinator [phone]

## 2016-12-16 ENCOUNTER — Other Ambulatory Visit: Payer: Medicare Other

## 2016-12-16 LAB — BASIC METABOLIC PANEL
Anion gap: 10 (ref 5–15)
BUN: 16 mg/dL (ref 6–20)
CALCIUM: 7.2 mg/dL — AB (ref 8.9–10.3)
CO2: 23 mmol/L (ref 22–32)
CREATININE: 1.57 mg/dL — AB (ref 0.44–1.00)
Chloride: 104 mmol/L (ref 101–111)
GFR calc Af Amer: 49 mL/min — ABNORMAL LOW (ref >60)
GFR, EST NON AFRICAN AMERICAN: 42 mL/min — AB (ref >60)
GLUCOSE: 158 mg/dL — AB (ref 65–99)
Potassium: 3.8 mmol/L (ref 3.5–5.1)
SODIUM: 137 mmol/L (ref 135–145)

## 2016-12-16 LAB — CBC
HCT: 31.6 % — ABNORMAL LOW (ref 36.0–46.0)
Hemoglobin: 9.9 g/dL — ABNORMAL LOW (ref 12.0–15.0)
MCH: 28.2 pg (ref 26.0–34.0)
MCHC: 31.3 g/dL (ref 30.0–36.0)
MCV: 90 fL (ref 78.0–100.0)
PLATELETS: 244 10*3/uL (ref 150–400)
RBC: 3.51 MIL/uL — ABNORMAL LOW (ref 3.87–5.11)
RDW: 14.6 % (ref 11.5–15.5)
WBC: 6.3 10*3/uL (ref 4.0–10.5)

## 2016-12-16 LAB — GLUCOSE, CAPILLARY
GLUCOSE-CAPILLARY: 153 mg/dL — AB (ref 65–99)
GLUCOSE-CAPILLARY: 140 mg/dL — AB (ref 65–99)
Glucose-Capillary: 89 mg/dL (ref 65–99)
Glucose-Capillary: 143 mg/dL — ABNORMAL HIGH (ref 65–99)
Glucose-Capillary: 142 mg/dL — ABNORMAL HIGH (ref 65–99)
Glucose-Capillary: 169 mg/dL — ABNORMAL HIGH (ref 65–99)

## 2016-12-16 MED ORDER — METOCLOPRAMIDE HCL 5 MG/ML IJ SOLN
5.0000 mg | Freq: Four times a day (QID) | INTRAMUSCULAR | Status: DC
  Administered 2016-12-16: 5 mg via INTRAVENOUS
  Filled 2016-12-16 (×5): qty 2

## 2016-12-16 MED ORDER — FAMOTIDINE IN NACL 20-0.9 MG/50ML-% IV SOLN
20.0000 mg | INTRAVENOUS | Status: DC
  Administered 2016-12-16 – 2016-12-17 (×2): 20 mg via INTRAVENOUS
  Filled 2016-12-16 (×2): qty 50

## 2016-12-16 NOTE — Care Management Important Message (Signed)
Important Message  Patient Details  Name: [name] MRN: [id number] Date of Birth: 1986-01-09   Medicare Important Message Given:  Yes    Tammye Kahler Abena 12/16/2016, 10:45 AM

## 2016-12-16 NOTE — Progress Notes (Signed)
Gastric emptying study pending.  Will revisit tomorrow.

## 2016-12-16 NOTE — Progress Notes (Signed)
PROGRESS NOTE [facility]   [name]   ZYS:[id number] DOB: 1985-05-31  DOA: 12/11/2016 PCP: [name], MD   Brief Narrative:  [name] he said 31 year old female with history of diabetes, end-stage renal disease on hemodialysis for 10 years and then status post DDRT in 2007, hypertension. Presented to the emergency department complaining of vomiting and abdominal pain. Patient was admitted for working diagnosis of possible gastroparesis. GI was consulted and planning for gastric emptying study.   Subjective: Couldn't tolerate gastric emptying study. Continues with nausea and vomiting.   Assessment & Plan: Intractable nausea vomiting and abdominal pain - unknown etiology at this time, concern of diabetic gastroparesis. CT abd/pelvis unremarkable. Lipase normal.  GI consulted - recommending gastric emptying study - discussed with GI she need empty study, EGD likely not be helpful. Discussed with patient she will try to do the study in AM  Reglan adjusted 5 mg q 6hrs, Change Protonix to Pepcid. D/c phenergan if nausea try Zofran   ESRD s/p kidney transplant, now with AKI - Cr improved  Patient currently on Prograf 2 mg BID Myfortic and prednisone 5 mg Cr increase likely in setting of n/v and HTN - pre-renal, continue IVF  Avoid nephrotoxins  Continue to monitor    DM type II insulin dependent  A1C 11.5, CBG stable, patient non compliant with medication at home. D/c Lantus as patient has poor appetite. Treat with SSI for now. Monitor CBG's   HTN  BP above goal, likely contributing to AKI  Lisinopril/HCTZ on hold  Amlodipine started  Continue hydralazine PRN for sbp > 170  DVT prophylaxis: heparin  Code Status: Full code  Family Communication: none at bedside  Disposition Plan: Home when medically stable   Consultants:   GI   Procedures:   None   Antimicrobials: Anti-infectives    None      Objective: Vitals:   12/16/16 1440 12/16/16 1452  12/16/16 1453 12/16/16 1612  BP: (!) 139/102 (!) 159/109 (!) 154/111 98/73  Pulse: (!) 102   (!) 119  Resp: 18     Temp: 98.7 F (37.1 C)     TempSrc: Oral     SpO2:      Weight:      Height:        Intake/Output Summary (Last 24 hours) at 12/16/16 1627 Last data filed at 12/16/16 1534  Gross per 24 hour  Intake          1151.25 ml  Output                0 ml  Net          1151.25 ml   Filed Weights   12/14/16 0500 12/15/16 0500 12/16/16 0445  Weight: 77.6 kg (171 lb) 79.2 kg (174 lb 9.6 oz) 79.2 kg (174 lb 11.2 oz)    Examination:  General: Pt is alert, awake, not in acute distress Cardiovascular: RRR, S1/S2 +, no rubs, no gallops Respiratory: CTA bilaterally, no wheezing, no rhonchi Abdominal: Soft, NT, ND, bowel sounds + Extremities: no edema, no cyanosis   Data Reviewed: I have personally reviewed following labs and imaging studies  CBC:  Recent Labs Lab 12/10/16 0609 12/11/16 1458 12/12/16 0410 12/13/16 0756 12/16/16 0456  WBC 7.7 10.3 6.8 7.0 6.3  HGB 10.7* 11.5* 9.7* 12.4 9.9*  HCT 34.3* 36.7 30.6* 38.0 31.6*  MCV 88.6 88.4 89.7 89.2 90.0  PLT 230 239 201 284 932   Basic Metabolic Panel:  Recent Labs Lab 12/12/16 0410 12/13/16 0756 12/14/16 0921 12/15/16 0654 12/16/16 0456  NA 139 137 137 136 137  K 3.0* 4.0 4.0 4.7 3.8  CL 104 101 98* 100* 104  CO2 28 27 27 23 23   GLUCOSE 140* 111* 114* 179* 158*  BUN 14 10 14  22* 16  CREATININE 1.33* 1.30* 1.66* 1.95* 1.57*  CALCIUM 6.8* 7.9* 8.1* 7.4* 7.2*  MG 1.7 2.0  --  1.6*  --   PHOS  --  3.1  --   --   --    GFR: Estimated Creatinine Clearance: 54.1 mL/min (A) (by C-G formula based on SCr of 1.57 mg/dL (H)). Liver Function Tests:  Recent Labs Lab 12/10/16 0609 12/11/16 1458 12/13/16 0756  AST 17 21  --   ALT 15 14  --   ALKPHOS 73 65  --   BILITOT 0.8 1.0  --   PROT 7.3 6.8  --   ALBUMIN 3.7 3.6 4.0    Recent Labs Lab 12/10/16 0609 12/11/16 1458  LIPASE 27 39   No results for  input(s): AMMONIA in the last 168 hours. Coagulation Profile: No results for input(s): INR, PROTIME in the last 168 hours. Cardiac Enzymes: No results for input(s): CKTOTAL, CKMB, CKMBINDEX, TROPONINI in the last 168 hours. BNP (last 3 results) No results for input(s): PROBNP in the last 8760 hours. HbA1C: No results for input(s): HGBA1C in the last 72 hours. CBG:  Recent Labs Lab 12/16/16 0104 12/16/16 0435 12/16/16 0745 12/16/16 1202 12/16/16 1604  GLUCAP 89 153* 140* 143* 142*   Lipid Profile: No results for input(s): CHOL, HDL, LDLCALC, TRIG, CHOLHDL, LDLDIRECT in the last 72 hours. Thyroid Function Tests: No results for input(s): TSH, T4TOTAL, FREET4, T3FREE, THYROIDAB in the last 72 hours. Anemia Panel: No results for input(s): VITAMINB12, FOLATE, FERRITIN, TIBC, IRON, RETICCTPCT in the last 72 hours. Sepsis Labs: No results for input(s): PROCALCITON, LATICACIDVEN in the last 168 hours.  No results found for this or any previous visit (from the past 240 hour(s)).    Radiology Studies: No results found.   Scheduled Meds: . amLODipine  5 mg Oral Daily  . fluticasone  2 spray Each Nare Daily  . heparin  5,000 Units Subcutaneous Q8H  . insulin aspart  0-15 Units Subcutaneous Q4H  . insulin detemir  25 Units Subcutaneous Once  . loratadine  10 mg Oral Daily  . metoCLOPramide  5 mg Oral TID AC & HS  . mycophenolate  720 mg Oral BID  . pantoprazole  40 mg Oral Daily  . potassium chloride  40 mEq Oral BID  . predniSONE  5 mg Oral QHS  . simvastatin  20 mg Oral Once per day on Mon Wed Fri  . sodium chloride flush  3 mL Intravenous Q12H  . tacrolimus  2 mg Oral BID   Continuous Infusions: . dextrose 5 % and 0.45% NaCl 75 mL/hr at 12/16/16 1331     LOS: 4 days   Time spent: Total of 15 minutes spent with pt, greater than 50% of which was spent in discussion of  treatment, counseling and coordination of care    Chipper Oman, MD Pager: Text Page via  [email or web address]   If 7PM-7AM, please contact night-coverage [email or web address] 12/16/2016, 4:27 PM

## 2016-12-17 LAB — BASIC METABOLIC PANEL
ANION GAP: 7 (ref 5–15)
BUN: 13 mg/dL (ref 6–20)
CALCIUM: 7.5 mg/dL — AB (ref 8.9–10.3)
CO2: 26 mmol/L (ref 22–32)
Chloride: 103 mmol/L (ref 101–111)
Creatinine, Ser: 1.61 mg/dL — ABNORMAL HIGH (ref 0.44–1.00)
GFR, EST AFRICAN AMERICAN: 48 mL/min — AB (ref >60)
GFR, EST NON AFRICAN AMERICAN: 41 mL/min — AB (ref >60)
GLUCOSE: 200 mg/dL — AB (ref 65–99)
POTASSIUM: 3.3 mmol/L — AB (ref 3.5–5.1)
Sodium: 136 mmol/L (ref 135–145)

## 2016-12-17 LAB — GLUCOSE, CAPILLARY
GLUCOSE-CAPILLARY: 209 mg/dL — AB (ref 65–99)
GLUCOSE-CAPILLARY: 213 mg/dL — AB (ref 65–99)
GLUCOSE-CAPILLARY: 227 mg/dL — AB (ref 65–99)
Glucose-Capillary: 190 mg/dL — ABNORMAL HIGH (ref 65–99)

## 2016-12-17 MED ORDER — FAMOTIDINE 20 MG PO TABS: 20.0000 mg | ORAL_TABLET | Freq: Two times a day (BID) | ORAL | 0 refills | Status: DC

## 2016-12-17 MED ORDER — AMLODIPINE BESYLATE 5 MG PO TABS: 5.0000 mg | ORAL_TABLET | Freq: Every day | ORAL | 0 refills | Status: DC

## 2016-12-17 NOTE — Progress Notes (Signed)
Pt stating she is having 8/10 abdominal pain. Pt given norco prn tab x1. Pt then reporting some nausea asking for phenergan, pt educated the reglan she has been refusing will help with the nausea and the stomach pain but she still refuses. Pt medicated with prn zofran 4mg  ivx1. WCTM

## 2016-12-17 NOTE — Discharge Summary (Signed)
Physician Discharge Summary  [name]  FWY:[id number]  DOB: September 06, 1985  DOA: 12/11/2016 PCP: [name], MD  Admit date: 12/11/2016 Discharge date: 12/17/2016  Admitted From: Home  Disposition: Home   Recommendations for Outpatient Follow-up:  1. Follow up with PCP in 1 week  2. Please obtain BMP/CBC in one week to monitor renal function and Hgb  3. Follow up with PCP - refer to gastric emptying study 4.  Discharge Condition: Stable  CODE STATUS: Full  Diet recommendation: Heart Healthy / Carb Modified    Brief/Interim Summary: [name] he said 31 year old female with history of diabetes, end-stage renal disease on hemodialysis for 10 years and then status post DDRT in 2007, hypertension. Presented to the emergency department complaining of vomiting and abdominal pain. Patient was admitted for working diagnosis of possible gastroparesis. GI was consulted and planning for gastric emptying study, patient declined study because egg did not have salt and pepper. Patient was schedule to repeat study on Monday. Patient wants to go home as she is tolerating diet well and feels significantly better. Patient was discharged home to follow up with PCP.  Subjective: Patient seen and examined. No nausea or vomiting today, tolerating soft diet. Afebrile.   Discharge Diagnoses/Hospital Course:  Intractable nausea vomiting and abdominal pain - unknown etiology at this time, concern of diabetic gastroparesis. Clinically undetermined. CT abd/pelvis unremarkable. Lipase normal.  GI consulted - recommending gastric emptying study - Patient did not tolerated well procedure, requesting to do this as outpatient. Patient tolerated diet well upon discharge  Reglan adjusted 5 mg q 6hrs, Change Protonix to Pepcid. D/c phenergan if nausea try Zofran  Small frequent meals   ESRD s/p kidney transplant, now with AKI - Cr improved  Patient currently on Prograf 2 mg BID Myfortic and prednisone 5 mg Cr  increase likely in setting of n/v and HTN - pre-renal - Cr improved  Avoid nephrotoxins  Follow up with nephrology   DM type II insulin dependent  A1C 11.5, CBG stable, patient non compliant with medication at home.  During hospital stay CBG's were acceptable.  Resume home insuline - discussed importance of blood sugar control for gastroparesis.   HTN  BP above goal, likely contributing to AKI  Lisinopril/HCTZ continue to hold until seen by nephrology  Started on Amlodipine - BP stable upon discharge   All other chronic medical condition were stable during the hospitalization.  On the day of the discharge the patient's vitals were stable, and no other acute medical condition were reported by patient. Patient was felt safe to be discharge to home   Discharge Instructions  You were cared for by a hospitalist during your hospital stay. If you have any questions about your discharge medications or the care you received while you were in the hospital after you are discharged, you can call the unit and asked to speak with the hospitalist on call if the hospitalist that took care of you is not available. Once you are discharged, your primary care physician will handle any further medical issues. Please note that NO REFILLS for any discharge medications will be authorized once you are discharged, as it is imperative that you return to your primary care physician (or establish a relationship with a primary care physician if you do not have one) for your aftercare needs so that they can reassess your need for medications and monitor your lab values.  Discharge Instructions    Call MD for:  difficulty breathing, headache or  visual disturbances    Complete by:  As directed    Call MD for:  extreme fatigue    Complete by:  As directed    Call MD for:  hives    Complete by:  As directed    Call MD for:  persistant dizziness or light-headedness    Complete by:  As directed    Call MD for:   persistant nausea and vomiting    Complete by:  As directed    Call MD for:  redness, tenderness, or signs of infection (pain, swelling, redness, odor or green/yellow discharge around incision site)    Complete by:  As directed    Call MD for:  severe uncontrolled pain    Complete by:  As directed    Call MD for:  temperature >100.4    Complete by:  As directed    Diet - low sodium heart healthy    Complete by:  As directed    Increase activity slowly    Complete by:  As directed      Allergies as of 12/17/2016      Reactions   Benadryl [diphenhydramine-zinc Acetate] Shortness Of Breath   Motrin [ibuprofen] Shortness Of Breath, Itching   Per pt   Banana Other (See Comments)   Sick on the stomach   Diphenhydramine Hcl    REACTION: Stopped breathing in ICU   Doxycycline    Shortness of Breath    Iron Dextran    REACTION: vein irritation   Shellfish Allergy Hives      Medication List    STOP taking these medications   acetaminophen 500 MG tablet Commonly known as:  TYLENOL   guaiFENesin 600 MG 12 hr tablet Commonly known as:  MUCINEX   lisinopril-hydrochlorothiazide 20-25 MG tablet Commonly known as:  PRINZIDE,ZESTORETIC   pantoprazole 20 MG tablet Commonly known as:  PROTONIX   promethazine 12.5 MG tablet Commonly known as:  PHENERGAN     TAKE these medications   ACCU-CHEK SOFTCLIX LANCETS lancets Use to check blood sugar 4 times per day. What changed:  Another medication with the same name was removed. Continue taking this medication, and follow the directions you see here.   amLODipine 5 MG tablet Commonly known as:  NORVASC Take 1 tablet (5 mg total) by mouth daily.   calcium carbonate 500 MG chewable tablet Commonly known as:  TUMS - dosed in mg elemental calcium Chew 2 tablets by mouth 3 (three) times daily as needed for indigestion. Takes on Monday, wednesdays, and fridays   cetirizine 10 MG tablet Commonly known as:  ZYRTEC Take 10 mg by mouth  daily.   cyclobenzaprine 10 MG tablet Commonly known as:  FLEXERIL Take 1 tablet (10 mg total) by mouth 2 (two) times daily as needed for muscle spasms.   diazepam 5 MG tablet Commonly known as:  VALIUM Take 1 tablet (5 mg total) by mouth every 6 (six) hours as needed for anxiety (spasms).   famotidine 20 MG tablet Commonly known as:  PEPCID Take 1 tablet (20 mg total) by mouth 2 (two) times daily. What changed:  when to take this   FIASP FLEXTOUCH 100 UNIT/ML FlexPen Generic drug:  insulin aspart Inject 15 Units into the skin 3 (three) times daily with meals.   fluticasone 50 MCG/ACT nasal spray Commonly known as:  FLONASE Place 2 sprays into both nostrils daily.   gabapentin 100 MG capsule Commonly known as:  NEURONTIN Take 100 mg by mouth daily  as needed (for feet).   glucose blood test strip Commonly known as:  BAYER CONTOUR NEXT TEST Check 4 times per day. DX CODE E11.9 What changed:  Another medication with the same name was removed. Continue taking this medication, and follow the directions you see here.   HYDROcodone-acetaminophen 5-325 MG tablet Commonly known as:  NORCO Take 1 tablet by mouth every 4 (four) hours as needed. What changed:  Another medication with the same name was removed. Continue taking this medication, and follow the directions you see here.   hydrOXYzine 25 MG tablet Commonly known as:  ATARAX/VISTARIL Take 25 mg by mouth 2 (two) times daily as needed for itching.   insulin degludec 100 UNIT/ML Sopn FlexTouch Pen Commonly known as:  TRESIBA FLEXTOUCH Inject 1.3 mLs (130 Units total) into the skin daily. And pen needles 1/day   metoCLOPramide 10 MG tablet Commonly known as:  REGLAN Take 1 tablet (10 mg total) by mouth every 6 (six) hours.   mycophenolate 360 MG Tbec EC tablet Commonly known as:  MYFORTIC Take 720 mg by mouth 2 (two) times daily.   ondansetron 4 MG tablet Commonly known as:  ZOFRAN Take 1 tablet (4 mg total) by mouth  every 8 (eight) hours as needed for nausea or vomiting.   predniSONE 5 MG tablet Commonly known as:  DELTASONE Take 5 mg by mouth at bedtime.   simvastatin 20 MG tablet Commonly known as:  ZOCOR Take 20 mg by mouth 3 (three) times a week. MWF.   tacrolimus 1 MG capsule Commonly known as:  PROGRAF Take 2 mg by mouth 2 (two) times daily.      Follow-up Information    [facility] Follow [facility].   Why:  home health services arranged , office will call to set up home visits Contact information: [address] [place] [address] [phone]        [name], MD. Schedule an appointment as soon as possible for a visit in 1 week(s).   Specialty:  Internal Medicine Why:  Hospital follow up  Contact information: [address] [place] [address] [phone]          Allergies  Allergen Reactions  . Benadryl [Diphenhydramine-Zinc Acetate] Shortness Of Breath  . Motrin [Ibuprofen] Shortness Of Breath and Itching    Per pt  . Banana Other (See Comments)    Sick on the stomach  . Diphenhydramine Hcl     REACTION: Stopped breathing in ICU  . Doxycycline     Shortness of Breath   . Iron Dextran     REACTION: vein irritation  . Shellfish Allergy Hives    Consultations:  GI    Procedures/Studies: Ct Abdomen Pelvis Wo Contrast  Result Date: 12/10/2016 CLINICAL DATA:  Unspecified abdominal pain. Abdominal pain is diffuse, with nausea for 3 days. EXAM: CT ABDOMEN AND PELVIS WITHOUT CONTRAST TECHNIQUE: Multidetector CT imaging of the abdomen and pelvis was performed following the standard protocol without IV contrast. COMPARISON:  10/01/2016 FINDINGS: Lower chest: No acute finding. Calcification along the left diaphragm that is stable and nonspecific. Mild atelectasis at the bases Hepatobiliary: No focal liver abnormality.No evidence of biliary obstruction or stone. Pancreas: Unremarkable. Spleen: Unremarkable. Adrenals/Urinary Tract:  Negative adrenals. Atrophic left kidney. A right kidney is not visualized. There is a transplant kidney in the right lower quadrant with stable pelviectasis and mild perinephric fat edema. Chronic large appearance, measuring 14.6 cm in length. Chronic cystic change in the upper pole  cortex and and along the lateral capsule. No evidence of stone. Unremarkable bladder. Stomach/Bowel:  No obstruction. No appendicitis. Vascular/Lymphatic: No acute vascular abnormality. No mass or adenopathy. Reproductive:No pathologic findings. Other: No ascites or pneumoperitoneum. Musculoskeletal: No acute abnormalities. IMPRESSION: 1. No acute finding.  Stable when compared to 10/01/2016. 2. Transplant kidney with chronic nephromegaly, perinephric stranding, and pelviectasis. Renal and perinephric cystic densities are stable. Please correlate with urinalysis. Electronically Signed   By: Monte Fantasia M.D.   On: 12/10/2016 11:40   Dg Chest 2 View  Result Date: 12/09/2016 CLINICAL DATA:  Chest pain. EXAM: CHEST  2 VIEW COMPARISON:  Radiograph 06/16/2016 FINDINGS: The cardiomediastinal contours are normal. Mild lung base atelectasis. Pulmonary vasculature is normal. No consolidation, pleural effusion, or pneumothorax. No acute osseous abnormalities are seen. IMPRESSION: Mild basilar atelectasis. Electronically Signed   By: Jeb Levering M.D.   On: 12/09/2016 01:06   Dg Abd 2 Views  Result Date: 12/09/2016 CLINICAL DATA:  Several days of abdominal pain. Nauseous lately. No bowel habit change. History of renal transplant on the right EXAM: ABDOMEN - 2 VIEW COMPARISON:  CT scan of the abdomen and pelvis of October 01, 2016 FINDINGS: The bowel gas pattern is normal. There is no free extraluminal gas. There are no abnormal soft tissue calcifications. There surgical clips projecting over the right sacral body. No acute bony abnormality is observed. IMPRESSION: No acute intra-abdominal abnormality is observed. Electronically Signed    By: David  Martinique M.D.   On: 12/09/2016 14:10   Discharge Exam: Vitals:   12/17/16 1424 12/17/16 1440  BP: 109/81   Pulse: (!) 106   Resp:    Temp: 98.3 F (36.8 C)   SpO2: (!) 79% 100%   Vitals:   12/17/16 0602 12/17/16 1014 12/17/16 1424 12/17/16 1440  BP: 130/87 121/89 109/81   Pulse: 89 94 (!) 106   Resp: 18     Temp: 98.1 F (36.7 C)  98.3 F (36.8 C)   TempSrc: Oral  Oral   SpO2: 96%  (!) 79% 100%  Weight: 78 kg (172 lb)     Height:        General: Pt is alert, awake, not in acute distress Cardiovascular: RRR, S1/S2 +, no rubs, no gallops Respiratory: CTA bilaterally, no wheezing, no rhonchi Abdominal: Soft, NT, ND, bowel sounds + Extremities: no edema, no cyanosis   The results of significant diagnostics from this hospitalization (including imaging, microbiology, ancillary and laboratory) are listed below for reference.     Microbiology: No results found for this or any previous visit (from the past 240 hour(s)).   Labs: BNP (last 3 results) No results for input(s): BNP in the last 8760 hours. Basic Metabolic Panel:  Recent Labs Lab 12/12/16 0410 12/13/16 0756 12/14/16 0921 12/15/16 0654 12/16/16 0456 12/17/16 0814  NA 139 137 137 136 137 136  K 3.0* 4.0 4.0 4.7 3.8 3.3*  CL 104 101 98* 100* 104 103  CO2 28 27 27 23 23 26   GLUCOSE 140* 111* 114* 179* 158* 200*  BUN 14 10 14  22* 16 13  CREATININE 1.33* 1.30* 1.66* 1.95* 1.57* 1.61*  CALCIUM 6.8* 7.9* 8.1* 7.4* 7.2* 7.5*  MG 1.7 2.0  --  1.6*  --   --   PHOS  --  3.1  --   --   --   --    Liver Function Tests:  Recent Labs Lab 12/11/16 1458 12/13/16 0756  AST 21  --   ALT  14  --   ALKPHOS 65  --   BILITOT 1.0  --   PROT 6.8  --   ALBUMIN 3.6 4.0    Recent Labs Lab 12/11/16 1458  LIPASE 39   No results for input(s): AMMONIA in the last 168 hours. CBC:  Recent Labs Lab 12/11/16 1458 12/12/16 0410 12/13/16 0756 12/16/16 0456  WBC 10.3 6.8 7.0 6.3  HGB 11.5* 9.7* 12.4 9.9*   HCT 36.7 30.6* 38.0 31.6*  MCV 88.4 89.7 89.2 90.0  PLT 239 201 284 244   Cardiac Enzymes: No results for input(s): CKTOTAL, CKMB, CKMBINDEX, TROPONINI in the last 168 hours. BNP: Invalid input(s): POCBNP CBG:  Recent Labs Lab 12/16/16 2035 12/17/16 0019 12/17/16 0601 12/17/16 1202 12/17/16 1624  GLUCAP 169* 213* 209* 190* 227*   D-Dimer No results for input(s): DDIMER in the last 72 hours. Hgb A1c No results for input(s): HGBA1C in the last 72 hours. Lipid Profile No results for input(s): CHOL, HDL, LDLCALC, TRIG, CHOLHDL, LDLDIRECT in the last 72 hours. Thyroid function studies No results for input(s): TSH, T4TOTAL, T3FREE, THYROIDAB in the last 72 hours.  Invalid input(s): FREET3 Anemia work up No results for input(s): VITAMINB12, FOLATE, FERRITIN, TIBC, IRON, RETICCTPCT in the last 72 hours. Urinalysis    Component Value Date/Time   COLORURINE YELLOW 12/10/2016 0611   APPEARANCEUR CLEAR 12/10/2016 0611   LABSPEC 1.014 12/10/2016 0611   PHURINE 6.0 12/10/2016 0611   GLUCOSEU 150 (A) 12/10/2016 0611   HGBUR NEGATIVE 12/10/2016 0611   HGBUR negative 01/26/2010 1026   BILIRUBINUR NEGATIVE 12/10/2016 0611   BILIRUBINUR NEG 01/01/2011 1050   KETONESUR NEGATIVE 12/10/2016 0611   PROTEINUR 100 (A) 12/10/2016 0611   UROBILINOGEN 0.2 01/09/2015 1333   NITRITE NEGATIVE 12/10/2016 0611   LEUKOCYTESUR NEGATIVE 12/10/2016 0611   Sepsis Labs Invalid input(s): PROCALCITONIN,  WBC,  LACTICIDVEN Microbiology No results found for this or any previous visit (from the past 240 hour(s)).   Time coordinating discharge: 35 minutes  SIGNED:  Chipper Oman, MD  [facility] 12/17/2016, 4:50 PM  Pager please text page via  [email or web address] Password [email or web address]

## 2016-12-17 NOTE — Progress Notes (Signed)
[facility] PROGRESS NOTE Subjective Patient was unable to tolerate gastric emptying scan reports that she is to have it done again today. She's not nauseated since she didn't need anything this morning  Objective: Vital signs in last 24 hours: Temp:  [98.1 F (36.7 C)-98.7 F (37.1 C)] 98.1 F (36.7 C) (10/05 0602) Pulse Rate:  [89-119] 94 (10/05 1014) Resp:  [18] 18 (10/05 0602) BP: (98-159)/(73-111) 121/89 (10/05 1014) SpO2:  [96 %-97 %] 96 % (10/05 0602) Weight:  [78 kg (172 lb)] 78 kg (172 lb) (10/05 0602) Last BM Date: 12/16/16  Intake/Output from previous day: 10/04 0701 - 10/05 0700 In: 1127.5 [P.O.:120; I.V.:957.5; IV Piggyback:50] Out: -  Intake/Output this shift: No intake/output data recorded.  PE: General--somewhat withdrawn no acute distress  Abdomen--nontender  Lab Results:  Recent Labs  12/16/16 0456  WBC 6.3  HGB 9.9*  HCT 31.6*  PLT 244   BMET  Recent Labs  12/15/16 0654 12/16/16 0456 12/17/16 0814  NA 136 137 136  K 4.7 3.8 3.3*  CL 100* 104 103  CO2 23 23 26   CREATININE 1.95* 1.57* 1.61*   LFT No results for input(s): PROT, AST, ALT, ALKPHOS, BILITOT, BILIDIR, IBILI in the last 72 hours. PT/INR No results for input(s): LABPROT, INR in the last 72 hours. PANCREAS No results for input(s): LIPASE in the last 72 hours.       Studies/Results: No results found.  Medications: I have reviewed the patient's current medications.  Assessment:   1. Nausea and vomiting/abdominal pain. This is almost certainly at least partially result of diabetic gastroparesis. She is [name] on metoclopramide and pantoprazole as well as ondansetron.   Plan: Go ahead and try to repeat the gastric emptying scan. Discussed with the patient, hopefully she will be able to tolerate the procedure today.   [name],[name] L 12/17/2016, 12:42 PM  This note was created using voice recognition software. Minor errors may Have occurred  unintentionally.  Pager: [phone] If no answer or after hours call [phone]

## 2016-12-23 ENCOUNTER — Encounter (HOSPITAL_COMMUNITY)
Admission: RE | Admit: 2016-12-23 | Discharge: 2016-12-23 | Disposition: A | Discharge status: Home / Self Care | Payer: Medicare Other | Source: Ambulatory Visit | Attending: Nephrology | Admitting: Nephrology

## 2016-12-23 DIAGNOSIS — D631 Anemia in chronic kidney disease: Secondary | ICD-10-CM | POA: Insufficient documentation

## 2016-12-23 DIAGNOSIS — N183 Chronic kidney disease, stage 3 (moderate): Secondary | ICD-10-CM | POA: Insufficient documentation

## 2016-12-23 DIAGNOSIS — N182 Chronic kidney disease, stage 2 (mild): Secondary | ICD-10-CM

## 2016-12-23 LAB — IRON AND TIBC
IRON: 65 ug/dL (ref 28–170)
Saturation Ratios: 30 % (ref 10.4–31.8)
TIBC: 217 ug/dL — ABNORMAL LOW (ref 250–450)
UIBC: 152 ug/dL

## 2016-12-23 LAB — FERRITIN: Ferritin: 1024 ng/mL — ABNORMAL HIGH (ref 11–307)

## 2016-12-23 LAB — POCT HEMOGLOBIN-HEMACUE: Hemoglobin: 11.3 g/dL — ABNORMAL LOW (ref 12.0–15.0)

## 2016-12-23 MED ORDER — EPOETIN ALFA 20000 UNIT/ML IJ SOLN
INTRAMUSCULAR | Status: AC
  Administered 2016-12-23: 20000 [IU] via SUBCUTANEOUS
  Filled 2016-12-23: qty 1

## 2016-12-23 MED ORDER — EPOETIN ALFA 40000 UNIT/ML IJ SOLN: 30000.0000 [IU] | INTRAMUSCULAR | Status: DC

## 2016-12-23 MED ORDER — EPOETIN ALFA 10000 UNIT/ML IJ SOLN
INTRAMUSCULAR | Status: AC
  Administered 2016-12-23: 10000 [IU] via SUBCUTANEOUS
  Filled 2016-12-23: qty 1

## 2016-12-30 DIAGNOSIS — I10 Essential (primary) hypertension: Secondary | ICD-10-CM | POA: Diagnosis not present

## 2016-12-30 DIAGNOSIS — Z9889 Other specified postprocedural states: Secondary | ICD-10-CM | POA: Diagnosis not present

## 2016-12-30 DIAGNOSIS — Z940 Kidney transplant status: Secondary | ICD-10-CM | POA: Diagnosis not present

## 2016-12-30 DIAGNOSIS — R112 Nausea with vomiting, unspecified: Secondary | ICD-10-CM | POA: Diagnosis not present

## 2016-12-30 DIAGNOSIS — E139 Other specified diabetes mellitus without complications: Secondary | ICD-10-CM | POA: Diagnosis not present

## 2016-12-30 DIAGNOSIS — D631 Anemia in chronic kidney disease: Secondary | ICD-10-CM | POA: Diagnosis not present

## 2016-12-30 DIAGNOSIS — E785 Hyperlipidemia, unspecified: Secondary | ICD-10-CM | POA: Diagnosis not present

## 2017-01-06 ENCOUNTER — Encounter (HOSPITAL_COMMUNITY)
Admission: RE | Admit: 2017-01-06 | Discharge: 2017-01-06 | Disposition: A | Discharge status: Home / Self Care | Payer: Medicare Other | Source: Ambulatory Visit | Attending: Nephrology | Admitting: Nephrology

## 2017-01-06 DIAGNOSIS — D631 Anemia in chronic kidney disease: Secondary | ICD-10-CM | POA: Diagnosis not present

## 2017-01-06 DIAGNOSIS — N182 Chronic kidney disease, stage 2 (mild): Secondary | ICD-10-CM

## 2017-01-06 DIAGNOSIS — N183 Chronic kidney disease, stage 3 (moderate): Secondary | ICD-10-CM | POA: Diagnosis not present

## 2017-01-06 LAB — POCT HEMOGLOBIN-HEMACUE: Hemoglobin: 11.3 g/dL — ABNORMAL LOW (ref 12.0–15.0)

## 2017-01-06 MED ORDER — EPOETIN ALFA 40000 UNIT/ML IJ SOLN
30000.0000 [IU] | INTRAMUSCULAR | Status: DC
Start: 2017-01-06 — End: 2017-01-07

## 2017-01-06 MED ORDER — EPOETIN ALFA 10000 UNIT/ML IJ SOLN
INTRAMUSCULAR | Status: AC
  Administered 2017-01-06: 10000 [IU]
  Filled 2017-01-06: qty 1

## 2017-01-06 MED ORDER — EPOETIN ALFA 20000 UNIT/ML IJ SOLN
INTRAMUSCULAR | Status: AC
  Administered 2017-01-06: 20000 [IU]
  Filled 2017-01-06: qty 1

## 2017-01-09 ENCOUNTER — Emergency Department (HOSPITAL_COMMUNITY)
Admission: EM | Admit: 2017-01-09 | Discharge: 2017-01-09 | Disposition: A | Discharge status: Home / Self Care | Payer: Medicare Other | Attending: Emergency Medicine | Admitting: Emergency Medicine

## 2017-01-09 ENCOUNTER — Encounter (HOSPITAL_COMMUNITY): Payer: Self-pay | Admitting: *Deleted

## 2017-01-09 DIAGNOSIS — I129 Hypertensive chronic kidney disease with stage 1 through stage 4 chronic kidney disease, or unspecified chronic kidney disease: Secondary | ICD-10-CM | POA: Insufficient documentation

## 2017-01-09 DIAGNOSIS — Z940 Kidney transplant status: Secondary | ICD-10-CM | POA: Diagnosis not present

## 2017-01-09 DIAGNOSIS — Z79899 Other long term (current) drug therapy: Secondary | ICD-10-CM | POA: Diagnosis not present

## 2017-01-09 DIAGNOSIS — N182 Chronic kidney disease, stage 2 (mild): Secondary | ICD-10-CM | POA: Diagnosis not present

## 2017-01-09 DIAGNOSIS — E1122 Type 2 diabetes mellitus with diabetic chronic kidney disease: Secondary | ICD-10-CM | POA: Diagnosis not present

## 2017-01-09 DIAGNOSIS — R112 Nausea with vomiting, unspecified: Secondary | ICD-10-CM

## 2017-01-09 DIAGNOSIS — R1013 Epigastric pain: Secondary | ICD-10-CM | POA: Diagnosis not present

## 2017-01-09 DIAGNOSIS — Z794 Long term (current) use of insulin: Secondary | ICD-10-CM | POA: Diagnosis not present

## 2017-01-09 LAB — URINALYSIS, ROUTINE W REFLEX MICROSCOPIC
BILIRUBIN URINE: NEGATIVE
Glucose, UA: >=500 mg/dL — AB
Hgb urine dipstick: NEGATIVE
Ketones, ur: 20 mg/dL — AB
Leukocytes, UA: NEGATIVE
NITRITE: NEGATIVE
PH: 5 (ref 5.0–8.0)
Protein, ur: 100 mg/dL — AB
SPECIFIC GRAVITY, URINE: 1.013 (ref 1.005–1.030)

## 2017-01-09 LAB — COMPREHENSIVE METABOLIC PANEL
ALK PHOS: 81 U/L (ref 38–126)
ALT: 14 U/L (ref 14–54)
AST: 17 U/L (ref 15–41)
Albumin: 3.7 g/dL (ref 3.5–5.0)
Anion gap: 13 (ref 5–15)
BUN: 32 mg/dL — AB (ref 6–20)
CALCIUM: 8 mg/dL — AB (ref 8.9–10.3)
CHLORIDE: 102 mmol/L (ref 101–111)
CO2: 24 mmol/L (ref 22–32)
CREATININE: 1.72 mg/dL — AB (ref 0.44–1.00)
GFR, EST AFRICAN AMERICAN: 44 mL/min — AB (ref >60)
GFR, EST NON AFRICAN AMERICAN: 38 mL/min — AB (ref >60)
Glucose, Bld: 325 mg/dL — ABNORMAL HIGH (ref 65–99)
Potassium: 4.1 mmol/L (ref 3.5–5.1)
Sodium: 139 mmol/L (ref 135–145)
Total Bilirubin: 0.5 mg/dL (ref 0.3–1.2)
Total Protein: 7.5 g/dL (ref 6.5–8.1)

## 2017-01-09 LAB — CBC
HCT: 37 % (ref 36.0–46.0)
Hemoglobin: 11.9 g/dL — ABNORMAL LOW (ref 12.0–15.0)
MCH: 28.6 pg (ref 26.0–34.0)
MCHC: 32.2 g/dL (ref 30.0–36.0)
MCV: 88.9 fL (ref 78.0–100.0)
Platelets: 281 10*3/uL (ref 150–400)
RBC: 4.16 MIL/uL (ref 3.87–5.11)
RDW: 15 % (ref 11.5–15.5)
WBC: 10.8 10*3/uL — AB (ref 4.0–10.5)

## 2017-01-09 LAB — I-STAT BETA HCG BLOOD, ED (MC, WL, AP ONLY): I-stat hCG, quantitative: 6.5 m[IU]/mL — ABNORMAL HIGH (ref <5)

## 2017-01-09 LAB — LIPASE, BLOOD: Lipase: 21 U/L (ref 11–51)

## 2017-01-09 LAB — CBG MONITORING, ED: GLUCOSE-CAPILLARY: 331 mg/dL — AB (ref 65–99)

## 2017-01-09 MED ORDER — HALOPERIDOL LACTATE 5 MG/ML IJ SOLN
5.0000 mg | Freq: Once | INTRAMUSCULAR | Status: AC
  Administered 2017-01-09: 5 mg via INTRAVENOUS
  Filled 2017-01-09: qty 1

## 2017-01-09 MED ORDER — ONDANSETRON HCL 4 MG PO TABS: 4.0000 mg | ORAL_TABLET | Freq: Three times a day (TID) | ORAL | 0 refills | Status: DC | PRN

## 2017-01-09 MED ORDER — FENTANYL CITRATE (PF) 100 MCG/2ML IJ SOLN
12.5000 ug | Freq: Once | INTRAMUSCULAR | Status: AC
  Administered 2017-01-09: 12.5 ug via INTRAVENOUS
  Filled 2017-01-09: qty 2

## 2017-01-09 MED ORDER — SODIUM CHLORIDE 0.9 % IV BOLUS (SEPSIS)
500.0000 mL | Freq: Once | INTRAVENOUS | Status: AC
  Administered 2017-01-09: 500 mL via INTRAVENOUS

## 2017-01-09 MED ORDER — FAMOTIDINE IN NACL 20-0.9 MG/50ML-% IV SOLN
20.0000 mg | Freq: Once | INTRAVENOUS | Status: AC
  Administered 2017-01-09: 20 mg via INTRAVENOUS
  Filled 2017-01-09: qty 50

## 2017-01-09 MED ORDER — HALOPERIDOL 5 MG PO TABS: 5.0000 mg | ORAL_TABLET | Freq: Two times a day (BID) | ORAL | 0 refills | Status: DC | PRN

## 2017-01-09 MED ORDER — FAMOTIDINE 40 MG PO TABS: 40.0000 mg | ORAL_TABLET | Freq: Every day | ORAL | 0 refills | Status: DC

## 2017-01-09 MED ORDER — ONDANSETRON HCL 4 MG/2ML IJ SOLN
4.0000 mg | Freq: Once | INTRAMUSCULAR | Status: AC
  Administered 2017-01-09: 4 mg via INTRAVENOUS
  Filled 2017-01-09: qty 2

## 2017-01-09 MED ORDER — FENTANYL CITRATE (PF) 100 MCG/2ML IJ SOLN
50.0000 ug | Freq: Once | INTRAMUSCULAR | Status: AC
  Administered 2017-01-09: 50 ug via INTRAVENOUS
  Filled 2017-01-09: qty 2

## 2017-01-09 MED ORDER — SODIUM CHLORIDE 0.9 % IV BOLUS (SEPSIS)
1000.0000 mL | Freq: Once | INTRAVENOUS | Status: AC
  Administered 2017-01-09: 1000 mL via INTRAVENOUS

## 2017-01-09 NOTE — ED Notes (Signed)
NS Bolus placed on pump at 999 ml/hr for bolus to complete. Patient has been instructed multiple times to keep R arm straightened for IV fluids to infuse, but has continued to bend arm and hold to abdomen. Patient verbalized understanding.

## 2017-01-09 NOTE — ED Notes (Signed)
Pt ambulated to the bathroom w/o any assistance or issues.

## 2017-01-09 NOTE — ED Notes (Signed)
Encouraged patient to drink fluids. Ice water provided to patient as requested.

## 2017-01-09 NOTE — ED Triage Notes (Signed)
Pt c/o mid abdominal pain x 2 days with NV and weakness. Has been seen here for same. Pt attempted to take nausea medications at home but vomited medications; hasn't been able to keep any liquids down. Also reports fever of "105.1"

## 2017-01-09 NOTE — Discharge Instructions (Signed)
Take all your medicines as prescribed.  Start taking Pepcid once a day. Use Zofran as needed for nausea or vomiting. Use Haldol as needed for nausea or abdominal pain.  It is very important that you try to remain well-hydrated.  Take small sips of water frequently throughout the day. Follow-up with your primary care doctor as soon as possible. Return to the emergency room if you develop fevers, persistent vomiting despite medicine, or any new or worsening symptoms.

## 2017-01-09 NOTE — ED Provider Notes (Signed)
[facility] EMERGENCY DEPARTMENT Provider Note   CSN: [id number] Arrival date & time: 01/09/17  0346     History   Chief Complaint Chief Complaint  Patient presents with  . Abdominal Pain  . Nausea    HPI [name] is a 31 y.o. female with past medical history of CKD status post renal transplant, diabetes, stroke paresis, hypertension, and into the ED for epigastric abdominal pain with nausea and vomiting since yesterday morning.  Patient states she has been unable to keep her Zofran down.  States the pain and symptoms are typical for her symptoms associated with gastroparesis.  Patient with multiple recent ED visits for similar symptoms.  On 12/10/2016, a CT abdomen/pelvis was done and negative.  Patient reporting today for symptomatic management of her pain and vomiting.  Reports associated fever.  Denies chills, urinary symptoms, vaginal bleeding or discharge, CP, SOB or any other complaints today.  The history is provided by the patient.    Past Medical History:  Diagnosis Date  . Chronic kidney disease   . Diabetes mellitus    Pt reports diagnosis in June 2011  . Hyperlipidemia   . Hypertension   . Kidney transplant recipient   . UTI (urinary tract infection) 01/09/2015    Patient Active Problem List   Diagnosis Date Noted  . Acute kidney injury superimposed on CKD ([place])   . Diabetes ([place]) 05/25/2015  . Acute UTI   . Emesis   . UTI (urinary tract infection) 01/09/2015  . Abdominal pain 01/09/2015  . Intractable nausea and vomiting 01/09/2015  . Acute on chronic kidney failure ([place]) 01/09/2015  . UTI (lower urinary tract infection) 01/09/2015  . Malnutrition of moderate degree 01/06/2015  . Renal failure (ARF), acute on chronic (HCC) 01/05/2015  . Fever, unspecified   . Renal transplant recipient   . Immunosuppressed status ([place])   . Hyponatremia   . Acute renal failure ([place]) 09/30/2014  . Sepsis ([place]) 06/24/2014  . Pyelonephritis  06/23/2014  . Dehydration 12/22/2012  . Chest pain 12/22/2012  . 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 CHRONIC KIDNEY DISEASE STAGE II (MILD) 01/04/2010  . OVARIAN FAILURE, PREMATURE 03/11/2009  . 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 SECONDARY HYPERPARATHYROIDISM 12/05/2007  . OBESITY 09/25/2007  . Anemia in chronic renal disease 09/25/2007  . LEARNING DISABILITY 09/25/2007  . Essential hypertension, benign 09/25/2007  . KIDNEY TRANSPLANTATION, HX OF 09/25/2007    Past Surgical History:  Procedure Laterality Date  . KIDNEY TRANSPLANT  2007  . RENAL BIOPSY Bilateral 2003  . THYROID LOBECTOMY      OB History    No data available       Home Medications    Prior to Admission medications   Medication Sig Start Date End Date Taking? Authorizing Provider  ACCU-CHEK SOFTCLIX LANCETS lancets Use to check blood sugar 4 times per day. 12/29/15   Renato Shin, MD  amLODipine (NORVASC) 5 MG tablet Take 1 tablet (5 mg total) by mouth daily. 12/18/16   Doreatha Lew, MD  calcium carbonate (TUMS) 500 MG chewable tablet Chew 2 tablets by mouth 3 (three) times daily as needed for indigestion. Takes on Monday, wednesdays, and fridays    [provider]  cetirizine (ZYRTEC) 10 MG tablet Take 10 mg by mouth daily.    [provider]  cyclobenzaprine (FLEXERIL) 10 MG tablet Take 1 tablet (10 mg total) by mouth 2 (two) times daily as needed for  muscle spasms. 07/30/16   Montine Circle, PA-C  diazepam (VALIUM) 5 MG tablet Take 1 tablet (5 mg total) by mouth every 6 (six) hours as needed for anxiety (spasms). 05/11/15   Elnora Morrison, MD  famotidine (PEPCID) 20 MG tablet Take 1 tablet (20 mg total) by mouth 2 (two) times daily. 12/17/16   Doreatha Lew, MD  FIASP FLEXTOUCH 100 UNIT/ML FlexPen Inject 15 Units into the skin 3 (three) times daily with meals.  11/08/16   [provider]  fluticasone (FLONASE) 50  MCG/ACT nasal spray Place 2 sprays into both nostrils daily. 07/20/15   Muthersbaugh, Jarrett Soho, PA-C  gabapentin (NEURONTIN) 100 MG capsule Take 100 mg by mouth daily as needed (for feet).  11/12/16   [provider]  glucose blood (BAYER CONTOUR NEXT TEST) test strip Check 4 times per day. DX CODE E11.9 06/13/15   Renato Shin, MD  HYDROcodone-acetaminophen [facility]) 5-325 MG tablet Take 1 tablet by mouth every 4 (four) hours as needed. 12/10/16   Jacqlyn Larsen, PA-C  hydrOXYzine (ATARAX) 25 MG tablet Take 25 mg by mouth 2 (two) times daily as needed for itching.     [provider]  insulin degludec (TRESIBA FLEXTOUCH) 100 UNIT/ML SOPN FlexTouch Pen Inject 1.3 mLs (130 Units total) into the skin daily. And pen needles 1/day 07/22/16   Renato Shin, MD  metoCLOPramide (REGLAN) 10 MG tablet Take 1 tablet (10 mg total) by mouth every 6 (six) hours. 12/10/16   Jacqlyn Larsen, PA-C  mycophenolate (MYFORTIC) 360 MG TBEC Take 720 mg by mouth 2 (two) times daily.     [provider]  ondansetron (ZOFRAN) 4 MG tablet Take 1 tablet (4 mg total) by mouth every 8 (eight) hours as needed for nausea or vomiting. 08/10/16   Curatolo, Adam, DO  predniSONE (DELTASONE) 5 MG tablet Take 5 mg by mouth at bedtime.     [provider]  simvastatin (ZOCOR) 20 MG tablet Take 20 mg by mouth 3 (three) times a week. MWF.    [provider]  tacrolimus (PROGRAF) 1 MG capsule Take 2 mg by mouth 2 (two) times daily.     [provider]    Family History Family History  Problem Relation Age of Onset  . Arthritis Mother   . Diabetes Mother   . Hypertension Mother     Social History Social History  Substance Use Topics  . Smoking status: Never Smoker  . Smokeless tobacco: Never Used  . Alcohol use No     Allergies   Benadryl [diphenhydramine-zinc acetate]; Motrin [ibuprofen]; Banana; Diphenhydramine hcl; Doxycycline; Iron dextran; and Shellfish allergy   Review of  Systems Review of Systems  Constitutional: Positive for fever. Negative for chills.  Respiratory: Negative for shortness of breath.   Cardiovascular: Negative for chest pain.  Gastrointestinal: Positive for abdominal pain, nausea and vomiting. Negative for blood in stool, constipation and diarrhea.  Genitourinary: Negative for dysuria, frequency, vaginal bleeding and vaginal discharge.  All other systems reviewed and are negative.    Physical Exam Updated Vital Signs BP (!) 139/106   Pulse (!) 109   Temp 98 F (36.7 C) (Oral)   Resp 18   SpO2 97%   Physical Exam  Constitutional: She appears well-developed and well-nourished.  Pt actively vomiting, nonbilious nonbloody emesis.  HENT:  Head: Normocephalic and atraumatic.  Mouth/Throat: Oropharynx is clear and moist.  Eyes: Conjunctivae are normal.  Cardiovascular: Regular rhythm, normal heart sounds and intact distal pulses.  tachycardic  Pulmonary/Chest: Effort normal and breath sounds normal. No respiratory distress. She has no wheezes. She has no rales.  Abdominal: Soft. Bowel sounds are normal. She exhibits no distension. There is generalized tenderness. There is no rebound and no guarding.  Multiple old surgical scars.  Neurological: She is alert.  Skin: Skin is warm.  Psychiatric: She has a normal mood and affect. Her behavior is normal.  Nursing note and vitals reviewed.   ED Treatments / Results  Labs (all labs ordered are listed, but only abnormal results are displayed) Labs Reviewed  COMPREHENSIVE METABOLIC PANEL - Abnormal; Notable for the following:       Result Value   Glucose, Bld 325 (*)    BUN 32 (*)    Creatinine, Ser 1.72 (*)    Calcium 8.0 (*)    GFR calc non Af Amer 38 (*)    GFR calc Af Amer 44 (*)    All other components within normal limits  CBC - Abnormal; Notable for the following:    WBC 10.8 (*)    Hemoglobin 11.9 (*)    All other components within normal limits  I-STAT BETA HCG BLOOD,  ED ([facility], [facility], [facility] ONLY) - Abnormal; Notable for the following:    I-stat hCG, quantitative 6.5 (*)    All other components within normal limits  LIPASE, BLOOD  URINALYSIS, ROUTINE W REFLEX MICROSCOPIC    EKG  EKG Interpretation None       Radiology No results found.  Procedures Procedures (including critical care time)  Medications Ordered in ED Medications  ondansetron (ZOFRAN) injection 4 mg (not administered)  sodium chloride 0.9 % bolus 1,000 mL (not administered)  fentaNYL (SUBLIMAZE) injection 50 mcg (not administered)     Initial Impression / Assessment and Plan / ED Course  I have reviewed the triage vital signs and the nursing notes.  Pertinent labs & imaging results that were available during my care of the patient were reviewed by me and considered in my medical decision making (see chart for details).     Pt presenting with N/V and abdominal pain since yesterday morning.  Patient with multiple ED visits for similar symptoms which patient reports sx are similar and not worsening.  On exam, patient with diffuse abdominal tenderness, however no peritoneal signs.  Patient with recent CT scan on 12/10/2016 which was negative.  Patient denies any changes in symptoms since that time.  Slightly tachycardic, afebrile, nontoxic-appearing.  Creatinine at baseline.  White count 10.8.  Plan to treat symptoms and reevaluate.  Fentanyl, Zofran, and 1 L of IV fluids given. Care assumed by [facility], pending re-evaluation.   Patient discussed with Dr. Tyrone Nine.  Final Clinical Impressions(s) / ED Diagnoses   Final diagnoses:  None    New Prescriptions New Prescriptions   No medications on file     Robinson, [place] N, PA-C 01/09/17 [place], [place], DO 01/09/17 1245

## 2017-01-09 NOTE — ED Provider Notes (Signed)
Pt signed out to me by Aviva Signs, PA-C. Please see previous notes for further history.   In brief, patient with frequent history of nausea, vomiting, abdominal pain due to gastroparesis.  PMH includes diabetes and renal transplant.  CT on 9/28 was without acute abnormality.  Patient states her symptoms are consistent with other times she has had gastroparesis.  She has been given Zofran and fentanyl for symptom control.  Fluids started.  Will attempt p.o. challenge.  On reassessment, patient reports her nausea is improved, and her pain is better.  She is willing to try p.o. challenge.  Requesting ice water.  Discussed with RN.  RN states patient is refusing to drink water, as she is still feeling nauseous and in pain.  Will give Haldol for symptoms and try p.o. challenge again.  Patient states her pain is continuing.  We will give repeat dose of fentanyl.  Encouraged fluid intake, as patient still has not tried to eat or drink anything.  Repeat IV fluids given.  Patient reports pain and nausea are resolved.  Will try p.o. challenge again.  Patient ambulated to bathroom without difficulty or pain.  Patient states that she vomited after half a cracker.  Requesting Pepcid, as this has made her feel better in the past.  Discussed possible need for admission if she is unable to tolerate p.o.  Patient states that she does not want to be admitted.  We will try Pepcid and repeat p.o. challenge.   Patient able to tolerate ginger ale and crackers without difficulty.  Patient's blood pressure and heart rate elevated throughout visit.  She is sitting there is shaking, she states she is cold, but keeps throwing off her blankets.  Values likely elevated due to muscular contractions.  Heart rate improves when patient is asleep and relaxed.  Discussed importance of follow-up with primary care for evaluation of gastroparesis.  Patient states she does not have Zofran or Pepcid at home.  She is asking for pain medicine  for home use.  Discussed that this can slow her bowel and worsen her symptoms.  Will give Haldol for nausea and pain relief.  Stressed importance of follow-up with primary care and fluid hydration.  At this time, patient appears safe for discharge.  Return precautions given.  Patient states she understands and agrees to plan.   Franchot Heidelberg, PA-C 01/09/17 [place], DO 01/12/17 0700

## 2017-01-09 NOTE — ED Notes (Signed)
Pt CBG 331

## 2017-01-10 DIAGNOSIS — Z940 Kidney transplant status: Secondary | ICD-10-CM | POA: Diagnosis not present

## 2017-01-10 DIAGNOSIS — K3184 Gastroparesis: Secondary | ICD-10-CM | POA: Diagnosis not present

## 2017-01-10 DIAGNOSIS — E1121 Type 2 diabetes mellitus with diabetic nephropathy: Secondary | ICD-10-CM | POA: Diagnosis not present

## 2017-01-10 DIAGNOSIS — I10 Essential (primary) hypertension: Secondary | ICD-10-CM | POA: Diagnosis not present

## 2017-01-12 ENCOUNTER — Emergency Department (HOSPITAL_COMMUNITY)
Admission: EM | Admit: 2017-01-12 | Discharge: 2017-01-12 | Disposition: A | Discharge status: Home / Self Care | Payer: Medicare Other | Attending: Emergency Medicine | Admitting: Emergency Medicine

## 2017-01-12 ENCOUNTER — Encounter (HOSPITAL_COMMUNITY): Payer: Self-pay

## 2017-01-12 DIAGNOSIS — R1110 Vomiting, unspecified: Secondary | ICD-10-CM | POA: Diagnosis not present

## 2017-01-12 DIAGNOSIS — Z5321 Procedure and treatment not carried out due to patient leaving prior to being seen by health care provider: Secondary | ICD-10-CM | POA: Insufficient documentation

## 2017-01-12 LAB — COMPREHENSIVE METABOLIC PANEL
ALK PHOS: 84 U/L (ref 38–126)
ALT: 13 U/L — AB (ref 14–54)
ANION GAP: 13 (ref 5–15)
AST: 15 U/L (ref 15–41)
Albumin: 3.6 g/dL (ref 3.5–5.0)
BILIRUBIN TOTAL: 1.1 mg/dL (ref 0.3–1.2)
BUN: 22 mg/dL — ABNORMAL HIGH (ref 6–20)
CALCIUM: 7.5 mg/dL — AB (ref 8.9–10.3)
CO2: 26 mmol/L (ref 22–32)
CREATININE: 1.72 mg/dL — AB (ref 0.44–1.00)
Chloride: 96 mmol/L — ABNORMAL LOW (ref 101–111)
GFR, EST AFRICAN AMERICAN: 44 mL/min — AB (ref >60)
GFR, EST NON AFRICAN AMERICAN: 38 mL/min — AB (ref >60)
Glucose, Bld: 308 mg/dL — ABNORMAL HIGH (ref 65–99)
Potassium: 3.7 mmol/L (ref 3.5–5.1)
SODIUM: 135 mmol/L (ref 135–145)
TOTAL PROTEIN: 8 g/dL (ref 6.5–8.1)

## 2017-01-12 LAB — URINALYSIS, ROUTINE W REFLEX MICROSCOPIC
BILIRUBIN URINE: NEGATIVE
Hgb urine dipstick: NEGATIVE
Ketones, ur: 20 mg/dL — AB
NITRITE: NEGATIVE
PH: 5 (ref 5.0–8.0)
Protein, ur: 100 mg/dL — AB
SPECIFIC GRAVITY, URINE: 1.015 (ref 1.005–1.030)

## 2017-01-12 LAB — LIPASE, BLOOD: Lipase: 44 U/L (ref 11–51)

## 2017-01-12 LAB — I-STAT BETA HCG BLOOD, ED (MC, WL, AP ONLY): I-stat hCG, quantitative: 5.8 m[IU]/mL — ABNORMAL HIGH (ref <5)

## 2017-01-12 LAB — CBC
HCT: 38.4 % (ref 36.0–46.0)
Hemoglobin: 12.3 g/dL (ref 12.0–15.0)
MCH: 29.1 pg (ref 26.0–34.0)
MCHC: 32 g/dL (ref 30.0–36.0)
MCV: 90.8 fL (ref 78.0–100.0)
PLATELETS: 283 10*3/uL (ref 150–400)
RBC: 4.23 MIL/uL (ref 3.87–5.11)
RDW: 14.6 % (ref 11.5–15.5)
WBC: 9.5 10*3/uL (ref 4.0–10.5)

## 2017-01-12 NOTE — ED Triage Notes (Signed)
Patient complains of ongoing vomiting that she reports is vomiting syndrome, told to come here for possible fluid and to be evaluated for dehydration

## 2017-01-12 NOTE — ED Notes (Signed)
Patient called for room, no answer. 

## 2017-01-13 ENCOUNTER — Emergency Department (HOSPITAL_COMMUNITY): Payer: Medicare Other

## 2017-01-13 ENCOUNTER — Encounter: Payer: Self-pay | Admitting: Internal Medicine

## 2017-01-13 ENCOUNTER — Encounter (HOSPITAL_COMMUNITY): Payer: Self-pay | Admitting: *Deleted

## 2017-01-13 ENCOUNTER — Emergency Department (HOSPITAL_COMMUNITY)
Admission: EM | Admit: 2017-01-13 | Discharge: 2017-01-13 | Disposition: A | Discharge status: Home / Self Care | Payer: Medicare Other | Attending: Emergency Medicine | Admitting: Emergency Medicine

## 2017-01-13 DIAGNOSIS — I129 Hypertensive chronic kidney disease with stage 1 through stage 4 chronic kidney disease, or unspecified chronic kidney disease: Secondary | ICD-10-CM | POA: Diagnosis not present

## 2017-01-13 DIAGNOSIS — F819 Developmental disorder of scholastic skills, unspecified: Secondary | ICD-10-CM | POA: Diagnosis not present

## 2017-01-13 DIAGNOSIS — N182 Chronic kidney disease, stage 2 (mild): Secondary | ICD-10-CM | POA: Insufficient documentation

## 2017-01-13 DIAGNOSIS — R1013 Epigastric pain: Secondary | ICD-10-CM | POA: Insufficient documentation

## 2017-01-13 DIAGNOSIS — Z79899 Other long term (current) drug therapy: Secondary | ICD-10-CM | POA: Diagnosis not present

## 2017-01-13 DIAGNOSIS — J9811 Atelectasis: Secondary | ICD-10-CM | POA: Diagnosis not present

## 2017-01-13 DIAGNOSIS — E1165 Type 2 diabetes mellitus with hyperglycemia: Secondary | ICD-10-CM | POA: Diagnosis not present

## 2017-01-13 DIAGNOSIS — R112 Nausea with vomiting, unspecified: Secondary | ICD-10-CM | POA: Insufficient documentation

## 2017-01-13 DIAGNOSIS — R739 Hyperglycemia, unspecified: Secondary | ICD-10-CM

## 2017-01-13 LAB — URINALYSIS, ROUTINE W REFLEX MICROSCOPIC
BACTERIA UA: NONE SEEN
Bilirubin Urine: NEGATIVE
Glucose, UA: 150 mg/dL — AB
HGB URINE DIPSTICK: NEGATIVE
Ketones, ur: 20 mg/dL — AB
Leukocytes, UA: NEGATIVE
NITRITE: NEGATIVE
PROTEIN: 100 mg/dL — AB
SPECIFIC GRAVITY, URINE: 1.018 (ref 1.005–1.030)
pH: 5 (ref 5.0–8.0)

## 2017-01-13 LAB — CBC WITH DIFFERENTIAL/PLATELET
BASOS PCT: 0 %
Basophils Absolute: 0 10*3/uL (ref 0.0–0.1)
Eosinophils Absolute: 0.5 10*3/uL (ref 0.0–0.7)
Eosinophils Relative: 4 %
HEMATOCRIT: 37.1 % (ref 36.0–46.0)
Hemoglobin: 11.5 g/dL — ABNORMAL LOW (ref 12.0–15.0)
LYMPHS ABS: 2.3 10*3/uL (ref 0.7–4.0)
Lymphocytes Relative: 22 %
MCH: 27.9 pg (ref 26.0–34.0)
MCHC: 31 g/dL (ref 30.0–36.0)
MCV: 90 fL (ref 78.0–100.0)
MONO ABS: 0.8 10*3/uL (ref 0.1–1.0)
MONOS PCT: 8 %
NEUTROS ABS: 6.9 10*3/uL (ref 1.7–7.7)
Neutrophils Relative %: 66 %
Platelets: 304 10*3/uL (ref 150–400)
RBC: 4.12 MIL/uL (ref 3.87–5.11)
RDW: 14.7 % (ref 11.5–15.5)
WBC: 10.5 10*3/uL (ref 4.0–10.5)

## 2017-01-13 LAB — LIPASE, BLOOD: LIPASE: 45 U/L (ref 11–51)

## 2017-01-13 LAB — COMPREHENSIVE METABOLIC PANEL
ALBUMIN: 3.6 g/dL (ref 3.5–5.0)
ALT: 12 U/L — ABNORMAL LOW (ref 14–54)
AST: 15 U/L (ref 15–41)
Alkaline Phosphatase: 75 U/L (ref 38–126)
Anion gap: 13 (ref 5–15)
BILIRUBIN TOTAL: 1.2 mg/dL (ref 0.3–1.2)
BUN: 26 mg/dL — AB (ref 6–20)
CALCIUM: 7.7 mg/dL — AB (ref 8.9–10.3)
CO2: 27 mmol/L (ref 22–32)
CREATININE: 1.92 mg/dL — AB (ref 0.44–1.00)
Chloride: 95 mmol/L — ABNORMAL LOW (ref 101–111)
GFR, EST AFRICAN AMERICAN: 39 mL/min — AB (ref >60)
GFR, EST NON AFRICAN AMERICAN: 33 mL/min — AB (ref >60)
GLUCOSE: 368 mg/dL — AB (ref 65–99)
Potassium: 3.7 mmol/L (ref 3.5–5.1)
Sodium: 135 mmol/L (ref 135–145)
TOTAL PROTEIN: 7.5 g/dL (ref 6.5–8.1)

## 2017-01-13 LAB — POC URINE PREG, ED: Preg Test, Ur: NEGATIVE

## 2017-01-13 LAB — TROPONIN I: Troponin I: <0.03 ng/mL (ref <0.03)

## 2017-01-13 MED ORDER — SODIUM CHLORIDE 0.9 % IV BOLUS (SEPSIS)
1000.0000 mL | Freq: Once | INTRAVENOUS | Status: AC
  Administered 2017-01-13: 1000 mL via INTRAVENOUS

## 2017-01-13 MED ORDER — PROCHLORPERAZINE MALEATE 10 MG PO TABS: 10.0000 mg | ORAL_TABLET | Freq: Two times a day (BID) | ORAL | 0 refills | Status: DC | PRN

## 2017-01-13 MED ORDER — METOCLOPRAMIDE HCL 5 MG/ML IJ SOLN: 10.0000 mg | Freq: Once | INTRAMUSCULAR | Status: DC

## 2017-01-13 MED ORDER — ONDANSETRON HCL 4 MG/2ML IJ SOLN
4.0000 mg | Freq: Once | INTRAMUSCULAR | Status: AC
  Administered 2017-01-13: 4 mg via INTRAVENOUS
  Filled 2017-01-13: qty 2

## 2017-01-13 MED ORDER — GI COCKTAIL ~~LOC~~
30.0000 mL | Freq: Once | ORAL | Status: AC
  Administered 2017-01-13: 30 mL via ORAL
  Filled 2017-01-13: qty 30

## 2017-01-13 MED ORDER — PROCHLORPERAZINE EDISYLATE 5 MG/ML IJ SOLN
10.0000 mg | Freq: Once | INTRAMUSCULAR | Status: AC
  Administered 2017-01-13: 10 mg via INTRAVENOUS
  Filled 2017-01-13: qty 2

## 2017-01-13 MED ORDER — INSULIN ASPART 100 UNIT/ML ~~LOC~~ SOLN
6.0000 [IU] | Freq: Once | SUBCUTANEOUS | Status: AC
  Administered 2017-01-13: 6 [IU] via SUBCUTANEOUS
  Filled 2017-01-13: qty 1

## 2017-01-13 MED ORDER — DICYCLOMINE HCL 10 MG PO CAPS
10.0000 mg | ORAL_CAPSULE | Freq: Once | ORAL | Status: AC
  Administered 2017-01-13: 10 mg via ORAL
  Filled 2017-01-13: qty 1

## 2017-01-13 MED ORDER — DICYCLOMINE HCL 20 MG PO TABS: 20.0000 mg | ORAL_TABLET | Freq: Two times a day (BID) | ORAL | 0 refills | Status: DC

## 2017-01-13 NOTE — ED Triage Notes (Signed)
The pt is c/o abdomen pain for 2 days  C/o chest pain for 2 hours  She was here earlier tonight but left after she was here for 4 hours because she had not been seen crying in triage

## 2017-01-13 NOTE — ED Notes (Signed)
Gave pt water for fluid challenge

## 2017-01-13 NOTE — ED Notes (Signed)
Pt keeps removing equipment needed to check her vitals.

## 2017-01-13 NOTE — Discharge Instructions (Signed)
Please read and follow all provided instructions.  Your diagnoses today include:  1. Non-intractable vomiting with nausea, unspecified vomiting type   2. Epigastric pain   3. Hyperglycemia without ketosis     Tests performed today include:  Blood counts and electrolytes  Blood tests to check liver and kidney function  Blood tests to check pancreas function  Urine test to look for infection and pregnancy (in women)  Vital signs. See below for your results today.   Medications prescribed:   Bentyl - medication for intestinal cramps and spasms   Compazine - medication for nausea and vomiting  Take any prescribed medications only as directed.  Home care instructions:   Follow any educational materials contained in this packet.  Follow-up instructions: Please follow-up with your primary care provider in the next 3 days for further evaluation of your symptoms.    Return instructions:  SEEK IMMEDIATE MEDICAL ATTENTION IF:  The pain does not go away or becomes severe   A temperature above 101F develops   Repeated vomiting occurs (multiple episodes)   The pain becomes localized to portions of the abdomen. The right side could possibly be appendicitis. In an adult, the left lower portion of the abdomen could be colitis or diverticulitis.   Blood is being passed in stools or vomit (bright red or black tarry stools)   You develop chest pain, difficulty breathing, dizziness or fainting, or become confused, poorly responsive, or inconsolable (young children)  If you have any other emergent concerns regarding your health  Additional Information: Abdominal (belly) pain can be caused by many things. Your caregiver performed an examination and possibly ordered blood/urine tests and imaging (CT scan, x-rays, ultrasound). Many cases can be observed and treated at home after initial evaluation in the emergency department. Even though you are being discharged home, abdominal pain can  be unpredictable. Therefore, you need a repeated exam if your pain does not resolve, returns, or worsens. Most patients with abdominal pain don't have to be admitted to the hospital or have surgery, but serious problems like appendicitis and gallbladder attacks can start out as nonspecific pain. Many abdominal conditions cannot be diagnosed in one visit, so follow-up evaluations are very important.  Your vital signs today were: BP (!) 143/114    Pulse 93    Temp 98.3 F (36.8 C)    Resp 15    Ht 5\' 6"  (1.676 m)    Wt 77.6 kg (171 lb)    SpO2 96%    BMI 27.60 kg/m  If your blood pressure (bp) was elevated above 135/85 this visit, please have this repeated by your doctor within one month. --------------

## 2017-01-13 NOTE — ED Notes (Signed)
Pt verbalized understanding discharge instructions and denies any further needs or questions at this time. VS stable, ambulatory and steady gait.   

## 2017-01-13 NOTE — ED Provider Notes (Signed)
[facility] EMERGENCY DEPARTMENT Provider Note   CSN: [id number] Arrival date & time: 01/13/17  0428     History   Chief Complaint Chief Complaint  Patient presents with  . Abdominal Pain  . Chest Pain    HPI [name] is a 31 y.o. female.  Patient with history of kidney transplant on immunosuppressive therapy, gastroparesis, diabetes--presents with complaints of uncontrolled vomiting and epigastric abdominal pain.  Patient states that this has been going on "for a while".  Symptoms have been worse for about a month.  Patient denies fever.  Vomiting is nonbloody, nonbilious.  No diarrhea or urinary symptoms including dysuria, hematuria, increased frequency or urgency.  Patient does not have any changing or new pain over her transplant.  She denies drug use.  Patient also complains of intermittent right sided chest pain without shortness of breath. Patient denies risk factors for pulmonary embolism including: unilateral leg swelling, history of DVT/PE/other blood clots, use of exogenous hormones, recent immobilizations, recent surgery, recent travel (>4hr segment), malignancy, hemoptysis.       Past Medical History:  Diagnosis Date  . Chronic kidney disease   . Diabetes mellitus    Pt reports diagnosis in June 2011  . Hyperlipidemia   . Hypertension   . Kidney transplant recipient   . UTI (urinary tract infection) 01/09/2015    Patient Active Problem List   Diagnosis Date Noted  . Acute kidney injury superimposed on CKD ([place])   . Diabetes ([place]) 05/25/2015  . Acute UTI   . Emesis   . UTI (urinary tract infection) 01/09/2015  . Abdominal pain 01/09/2015  . Intractable nausea and vomiting 01/09/2015  . Acute on chronic kidney failure ([place]) 01/09/2015  . UTI (lower urinary tract infection) 01/09/2015  . Malnutrition of moderate degree 01/06/2015  . Renal failure (ARF), acute on chronic (HCC) 01/05/2015  . Fever, unspecified   . Renal transplant  recipient   . Immunosuppressed status ([place])   . Hyponatremia   . Acute renal failure ([place]) 09/30/2014  . Sepsis ([place]) 06/24/2014  . Pyelonephritis 06/23/2014  . Dehydration 12/22/2012  . Chest pain 12/22/2012  . 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 CHRONIC KIDNEY DISEASE STAGE II (MILD) 01/04/2010  . OVARIAN FAILURE, PREMATURE 03/11/2009  . 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 SECONDARY HYPERPARATHYROIDISM 12/05/2007  . OBESITY 09/25/2007  . Anemia in chronic renal disease 09/25/2007  . LEARNING DISABILITY 09/25/2007  . Essential hypertension, benign 09/25/2007  . KIDNEY TRANSPLANTATION, HX OF 09/25/2007    Past Surgical History:  Procedure Laterality Date  . KIDNEY TRANSPLANT  2007  . RENAL BIOPSY Bilateral 2003  . THYROID LOBECTOMY      OB History    No data available       Home Medications    Prior to Admission medications   Medication Sig Start Date End Date Taking? Authorizing Provider  calcium carbonate (TUMS) 500 MG chewable tablet Chew 2 tablets by mouth 3 (three) times daily as needed for indigestion. Takes on Monday, wednesdays, and fridays   Yes [provider]  cetirizine (ZYRTEC) 10 MG tablet Take 10 mg by mouth daily.   Yes [provider]  cyclobenzaprine (FLEXERIL) 10 MG tablet Take 1 tablet (10 mg total) by mouth 2 (two) times daily as needed for muscle spasms. 07/30/16  Yes Montine Circle, PA-C  diazepam (VALIUM) 5 MG tablet Take 1 tablet (5 mg total) by mouth every 6 (six) hours as  needed for anxiety (spasms). 05/11/15  Yes Elnora Morrison, MD  famotidine (PEPCID) 40 MG tablet Take 1 tablet (40 mg total) by mouth daily. 01/09/17  Yes Caccavale, Sophia, PA-C  fluticasone (FLONASE) 50 MCG/ACT nasal spray Place 2 sprays into both nostrils daily. 07/20/15  Yes Muthersbaugh, Jarrett Soho, PA-C  gabapentin (NEURONTIN) 100 MG capsule Take 100 mg by mouth daily as needed (for feet).  11/12/16  Yes  [provider]  HYDROcodone-acetaminophen (NORCO) 5-325 MG tablet Take 1 tablet by mouth every 4 (four) hours as needed. 12/10/16  Yes Jacqlyn Larsen, PA-C  hydrOXYzine (ATARAX) 25 MG tablet Take 25 mg by mouth 2 (two) times daily as needed for itching.    Yes [provider]  insulin degludec (TRESIBA FLEXTOUCH) 100 UNIT/ML SOPN FlexTouch Pen Inject 1.3 mLs (130 Units total) into the skin daily. And pen needles 1/day 07/22/16  Yes Renato Shin, MD  lisinopril-hydrochlorothiazide (PRINZIDE,ZESTORETIC) 20-25 MG tablet Take 1 tablet by mouth daily.   Yes [provider]  mycophenolate (MYFORTIC) 360 MG TBEC Take 720 mg by mouth 2 (two) times daily.    Yes [provider]  ondansetron (ZOFRAN) 4 MG tablet Take 1 tablet (4 mg total) by mouth every 8 (eight) hours as needed for nausea or vomiting. 01/09/17  Yes Caccavale, Sophia, PA-C  predniSONE (DELTASONE) 5 MG tablet Take 5 mg by mouth at bedtime.    Yes [provider]  simvastatin (ZOCOR) 20 MG tablet Take 20 mg by mouth 3 (three) times a week. MWF.   Yes [provider]  tacrolimus (PROGRAF) 1 MG capsule Take 2 mg by mouth 2 (two) times daily.    Yes [provider]  ACCU-CHEK SOFTCLIX LANCETS lancets Use to check blood sugar 4 times per day. 12/29/15   Renato Shin, MD  amLODipine (NORVASC) 5 MG tablet Take 1 tablet (5 mg total) by mouth daily. Patient not taking: Reported on 01/13/2017 12/18/16   Patrecia Pour, Christean Grief, MD  famotidine (PEPCID) 20 MG tablet Take 1 tablet (20 mg total) by mouth 2 (two) times daily. Patient not taking: Reported on 01/13/2017 12/17/16   Patrecia Pour, Christean Grief, MD  FIASP FLEXTOUCH 100 UNIT/ML FlexPen Inject 15 Units into the skin 3 (three) times daily with meals.  11/08/16   [provider]  glucose blood (BAYER CONTOUR NEXT TEST) test strip Check 4 times per day. DX CODE E11.9 06/13/15   Renato Shin, MD  haloperidol (HALDOL) 5 MG tablet Take 1 tablet (5  mg total) by mouth 2 (two) times daily as needed for agitation. Patient not taking: Reported on 01/13/2017 01/09/17   Caccavale, Sophia, PA-C  metoCLOPramide (REGLAN) 10 MG tablet Take 1 tablet (10 mg total) by mouth every 6 (six) hours. Patient not taking: Reported on 01/13/2017 12/10/16   Jacqlyn Larsen, PA-C  ondansetron (ZOFRAN) 4 MG tablet Take 1 tablet (4 mg total) by mouth every 8 (eight) hours as needed for nausea or vomiting. Patient not taking: Reported on 01/13/2017 08/10/16   Lennice Sites, DO    Family History Family History  Problem Relation Age of Onset  . Arthritis Mother   . Diabetes Mother   . Hypertension Mother     Social History Social History  Substance Use Topics  . Smoking status: Never Smoker  . Smokeless tobacco: Never Used  . Alcohol use No     Allergies   Benadryl [diphenhydramine-zinc acetate]; Motrin [ibuprofen]; Banana; Diphenhydramine hcl; Doxycycline; Iron dextran; and Shellfish allergy   Review of  Systems Review of Systems  Constitutional: Negative for appetite change, diaphoresis and fever.  HENT: Negative for rhinorrhea and sore throat.   Eyes: Negative for redness.  Respiratory: Negative for cough and shortness of breath.   Cardiovascular: Positive for chest pain. Negative for palpitations and leg swelling.  Gastrointestinal: Positive for abdominal pain, nausea and vomiting. Negative for blood in stool and diarrhea.       Negative for hematemesis  Genitourinary: Negative for dysuria.  Musculoskeletal: Negative for back pain, myalgias and neck pain.  Skin: Negative for rash.  Neurological: Negative for syncope and light-headedness.  Psychiatric/Behavioral: The patient is not nervous/anxious.      Physical Exam Updated Vital Signs BP (!) 130/101   Pulse (!) 109   Temp 98.3 F (36.8 C)   Resp 18   Ht 5\' 6"  (1.676 m)   Wt 77.6 kg (171 lb)   SpO2 97%   BMI 27.60 kg/m   Physical Exam  Constitutional: She appears well-developed and  well-nourished.  HENT:  Head: Normocephalic and atraumatic.  Mouth/Throat: Oropharynx is clear and moist.  Eyes: Conjunctivae are normal. Right eye exhibits no discharge. Left eye exhibits no discharge.  Neck: Normal range of motion. Neck supple.  Cardiovascular: Regular rhythm and normal heart sounds.  Tachycardia present.   No murmur heard. Pulmonary/Chest: Effort normal and breath sounds normal. No respiratory distress. She has no wheezes. She has no rales.  Abdominal: Soft. There is tenderness. There is no rebound and no guarding.  Previous surgical scars noted.  Moderate epigastric tenderness.  No pain over location of renal transplant.  Musculoskeletal: She exhibits no edema or tenderness.  No clinical signs of DVT.  Neurological: She is alert.  Skin: Skin is warm and dry.  Psychiatric: She has a normal mood and affect.  Nursing note and vitals reviewed.    ED Treatments / Results  Labs (all labs ordered are listed, but only abnormal results are displayed) Labs Reviewed  CBC WITH DIFFERENTIAL/PLATELET - Abnormal; Notable for the following:       Result Value   Hemoglobin 11.5 (*)    All other components within normal limits  COMPREHENSIVE METABOLIC PANEL - Abnormal; Notable for the following:    Chloride 95 (*)    Glucose, Bld 368 (*)    BUN 26 (*)    Creatinine, Ser 1.92 (*)    Calcium 7.7 (*)    ALT 12 (*)    GFR calc non Af Amer 33 (*)    GFR calc Af Amer 39 (*)    All other components within normal limits  URINALYSIS, ROUTINE W REFLEX MICROSCOPIC - Abnormal; Notable for the following:    Glucose, UA 150 (*)    Ketones, ur 20 (*)    Protein, ur 100 (*)    Squamous Epithelial / LPF 0-5 (*)    All other components within normal limits  TROPONIN I  LIPASE, BLOOD  POC URINE PREG, ED  CBG MONITORING, ED    EKG  EKG Interpretation  Date/Time:  Thursday January 13 2017 04:34:50 EDT Ventricular Rate:  118 PR Interval:  112 QRS Duration: 76 QT  Interval:  346 QTC Calculation: 484 R Axis:   94 Text Interpretation:  Sinus tachycardia Rightward axis Borderline ECG faster rate, otherwise no changes Confirmed by Merrily Pew [phone]) on 01/13/2017 8:47:37 AM       Radiology Dg Chest 2 View  Result Date: 01/13/2017 CLINICAL DATA:  Chest and abdominal pain for 2 days. Shortness of  breath. EXAM: CHEST  2 VIEW COMPARISON:  12/09/2016 FINDINGS: The cardiomediastinal contours are normal. Subsegmental atelectasis in the lung bases. Pulmonary vasculature is normal. No consolidation, pleural effusion, or pneumothorax. No acute osseous abnormalities are seen. IMPRESSION: Subsegmental bibasilar atelectasis. Electronically Signed   By: Jeb Levering M.D.   On: 01/13/2017 05:23    Procedures Procedures (including critical care time)  Medications Ordered in ED Medications  sodium chloride 0.9 % bolus 1,000 mL (0 mLs Intravenous Stopped 01/13/17 1057)  ondansetron (ZOFRAN) injection 4 mg (4 mg Intravenous Given 01/13/17 1012)  gi cocktail (Maalox,Lidocaine,Donnatal) (30 mLs Oral Given 01/13/17 1200)  prochlorperazine (COMPAZINE) injection 10 mg (10 mg Intravenous Given 01/13/17 1343)  dicyclomine (BENTYL) capsule 10 mg (10 mg Oral Given 01/13/17 1343)  insulin aspart (novoLOG) injection 6 Units (6 Units Subcutaneous Given 01/13/17 1412)     Initial Impression / Assessment and Plan / ED Course  I have reviewed the triage vital signs and the nursing notes.  Pertinent labs & imaging results that were available during my care of the patient were reviewed by me and considered in my medical decision making (see chart for details).     Patient seen and examined. Work-up initiated. Medications ordered.  Reviewed previous ED visits and hospitalization.  Symptoms seem very similar to previous episodes of vomiting and gastroparesis.  Will treat accordingly.  EKG reviewed.  Troponin negative.  Right-sided chest pain is atypical for ACS.  Patient does not  have any risk factors or hypoxia to suggest PE.  Her main complaint is epigastric pain and vomiting.  Vital signs reviewed and are as follows: BP (!) 130/101   Pulse (!) 109   Temp 98.3 F (36.8 C)   Resp 18   Ht 5\' 6"  (1.676 m)   Wt 77.6 kg (171 lb)   SpO2 97%   BMI 27.60 kg/m   Plan: Nausea control, hydration, reevaluation.  Patient did have some vomiting so Bentyl and Compazine ordered with good relief.  Pain reduced to 1 out of 10.  Will discharge home with these medications.  Encouraged PCP follow-up.  Pt reaffirmed that she did not want to be admitted to the hospital today.  The patient was urged to return to the Emergency Department immediately with worsening of current symptoms, worsening abdominal pain, persistent vomiting, blood noted in stools, fever, or any other concerns. The patient verbalized understanding.     Final Clinical Impressions(s) / ED Diagnoses   Final diagnoses:  Non-intractable vomiting with nausea, unspecified vomiting type  Epigastric pain  Hyperglycemia without ketosis   Patient with abdominal pain, vomiting, history of gastroparesis, renal transplant.  Creatinine is stable.  No pain over the itself.  No signs of UTI or pyelonephritis.  Vitals are stable, no fever. Labs reassuring. Imaging not felt indicated, patient with 4 previous negative CTs of the abdomen and pelvis this year. No signs of dehydration, patient is tolerating PO's. Lungs are clear and no signs suggestive of PNA. Low concern for appendicitis, cholecystitis, pancreatitis, ruptured viscus, UTI, kidney stone, aortic dissection, aortic aneurysm or other emergent abdominal etiology. Supportive therapy indicated with return if symptoms worsen.    New Prescriptions New Prescriptions   DICYCLOMINE (BENTYL) 20 MG TABLET    Take 1 tablet (20 mg total) by mouth 2 (two) times daily.   PROCHLORPERAZINE (COMPAZINE) 10 MG TABLET    Take 1 tablet (10 mg total) by mouth 2 (two) times daily as needed  for nausea or vomiting.  Carlisle Cater, PA-C 01/13/17 1439    Merrily Pew, MD 01/13/17 [phone]

## 2017-01-14 LAB — CBG MONITORING, ED: Glucose-Capillary: 299 mg/dL — ABNORMAL HIGH (ref 65–99)

## 2017-01-20 ENCOUNTER — Encounter (HOSPITAL_COMMUNITY)
Admission: RE | Admit: 2017-01-20 | Discharge: 2017-01-20 | Disposition: A | Discharge status: Home / Self Care | Payer: Medicare Other | Source: Ambulatory Visit | Attending: Nephrology | Admitting: Nephrology

## 2017-01-20 VITALS — BP 98/76 | HR 99 | Temp 98.3°F | Resp 18

## 2017-01-20 DIAGNOSIS — D631 Anemia in chronic kidney disease: Secondary | ICD-10-CM | POA: Insufficient documentation

## 2017-01-20 DIAGNOSIS — N183 Chronic kidney disease, stage 3 (moderate): Secondary | ICD-10-CM | POA: Diagnosis not present

## 2017-01-20 DIAGNOSIS — N182 Chronic kidney disease, stage 2 (mild): Secondary | ICD-10-CM

## 2017-01-20 LAB — POCT HEMOGLOBIN-HEMACUE: Hemoglobin: 11.3 g/dL — ABNORMAL LOW (ref 12.0–15.0)

## 2017-01-20 MED ORDER — EPOETIN ALFA 10000 UNIT/ML IJ SOLN
INTRAMUSCULAR | Status: AC
  Administered 2017-01-20: 10000 [IU] via SUBCUTANEOUS
  Filled 2017-01-20: qty 1

## 2017-01-20 MED ORDER — EPOETIN ALFA 20000 UNIT/ML IJ SOLN
INTRAMUSCULAR | Status: AC
  Administered 2017-01-20: 20000 [IU] via SUBCUTANEOUS
  Filled 2017-01-20: qty 1

## 2017-01-20 MED ORDER — EPOETIN ALFA 40000 UNIT/ML IJ SOLN: 30000.0000 [IU] | INTRAMUSCULAR | Status: DC

## 2017-02-04 ENCOUNTER — Encounter (HOSPITAL_COMMUNITY)
Admission: RE | Admit: 2017-02-04 | Discharge: 2017-02-04 | Disposition: A | Discharge status: Home / Self Care | Payer: Medicare Other | Source: Ambulatory Visit | Attending: Nephrology | Admitting: Nephrology

## 2017-02-04 VITALS — BP 95/68 | HR 106 | Temp 98.3°F | Resp 18

## 2017-02-04 DIAGNOSIS — N182 Chronic kidney disease, stage 2 (mild): Secondary | ICD-10-CM

## 2017-02-04 DIAGNOSIS — N183 Chronic kidney disease, stage 3 (moderate): Secondary | ICD-10-CM | POA: Diagnosis not present

## 2017-02-04 DIAGNOSIS — D631 Anemia in chronic kidney disease: Secondary | ICD-10-CM | POA: Diagnosis not present

## 2017-02-04 MED ORDER — EPOETIN ALFA 40000 UNIT/ML IJ SOLN: 30000.0000 [IU] | INTRAMUSCULAR | Status: DC

## 2017-02-04 MED ORDER — EPOETIN ALFA 10000 UNIT/ML IJ SOLN
INTRAMUSCULAR | Status: AC
  Administered 2017-02-04: 10000 [IU]
  Filled 2017-02-04: qty 1

## 2017-02-04 MED ORDER — EPOETIN ALFA 20000 UNIT/ML IJ SOLN
INTRAMUSCULAR | Status: AC
  Administered 2017-02-04: 20000 [IU]
  Filled 2017-02-04: qty 1

## 2017-02-07 LAB — POCT HEMOGLOBIN-HEMACUE: HEMOGLOBIN: 9.7 g/dL — AB (ref 12.0–15.0)

## 2017-02-14 ENCOUNTER — Encounter: Payer: Medicare Other | Attending: Physician Assistant | Admitting: Physician Assistant

## 2017-02-14 DIAGNOSIS — Z888 Allergy status to other drugs, medicaments and biological substances status: Secondary | ICD-10-CM | POA: Insufficient documentation

## 2017-02-14 DIAGNOSIS — I10 Essential (primary) hypertension: Secondary | ICD-10-CM | POA: Diagnosis not present

## 2017-02-14 DIAGNOSIS — L97512 Non-pressure chronic ulcer of other part of right foot with fat layer exposed: Secondary | ICD-10-CM | POA: Diagnosis not present

## 2017-02-14 DIAGNOSIS — M1990 Unspecified osteoarthritis, unspecified site: Secondary | ICD-10-CM | POA: Insufficient documentation

## 2017-02-14 DIAGNOSIS — Z794 Long term (current) use of insulin: Secondary | ICD-10-CM | POA: Insufficient documentation

## 2017-02-14 DIAGNOSIS — Z940 Kidney transplant status: Secondary | ICD-10-CM | POA: Insufficient documentation

## 2017-02-14 DIAGNOSIS — E785 Hyperlipidemia, unspecified: Secondary | ICD-10-CM | POA: Diagnosis not present

## 2017-02-14 DIAGNOSIS — W292XXA Contact with other powered household machinery, initial encounter: Secondary | ICD-10-CM | POA: Diagnosis not present

## 2017-02-14 DIAGNOSIS — T3110 Burns involving 10-19% of body surface with 0% to 9% third degree burns: Secondary | ICD-10-CM | POA: Diagnosis not present

## 2017-02-14 DIAGNOSIS — G40909 Epilepsy, unspecified, not intractable, without status epilepticus: Secondary | ICD-10-CM | POA: Diagnosis not present

## 2017-02-14 DIAGNOSIS — Z91013 Allergy to seafood: Secondary | ICD-10-CM | POA: Insufficient documentation

## 2017-02-14 DIAGNOSIS — E11621 Type 2 diabetes mellitus with foot ulcer: Secondary | ICD-10-CM | POA: Insufficient documentation

## 2017-02-14 DIAGNOSIS — D649 Anemia, unspecified: Secondary | ICD-10-CM | POA: Diagnosis not present

## 2017-02-14 NOTE — Progress Notes (Signed)
Valerie, [name] ([id number]) Visit Report for 02/14/2017 Abuse/Suicide Risk Screen Details Patient Name: [name], [name] Date of Service: 02/14/2017 1:30 PM Medical Record Number: [id number] Patient Account Number: [id number] Date of Birth/Sex: [birth year] (31 y.o. Female) Treating RN: Montey Hora Primary Care Demonie Kassa: [name] Other Clinician: Referring Conswella Bruney: [name] Treating Xanthe Couillard/Extender: STONE III, HOYT Weeks in Treatment: 0 Abuse/Suicide Risk Screen Items Answer ABUSE/SUICIDE RISK SCREEN: Has anyone close to you tried to hurt or harm you recentlyo No Do you feel uncomfortable with anyone in your familyo No Has anyone forced you do things that you didnot want to doo No Do you have any thoughts of harming yourselfo No Patient displays signs or symptoms of abuse and/or neglect. No Electronic Signature(s) Signed: 02/14/2017 4:00:26 PM By: Montey Hora Entered By: Montey Hora on 02/14/2017 13:54:45 [name] ([id number]) -------------------------------------------------------------------------------- Activities of Daily Living Details Patient Name: [name] Date of Service: 02/14/2017 1:30 PM Medical Record Number: [id number] Patient Account Number: [id number] Date of Birth/Sex: [birth year] (31 y.o. Female) Treating RN: Montey Hora Primary Care Yuto Cajuste: [name] Other Clinician: Referring Monserrate Blaschke: [name] Treating Tilley Faeth/Extender: STONE III, HOYT Weeks in Treatment: 0 Activities of Daily Living Items Answer Activities of Daily Living (Please select one for each item) Drive Automobile Completely Able Take Medications Completely Able Use Telephone Completely Able Care for Appearance Completely Able Use Toilet Completely Able Bath / Shower Completely Able Dress Self Completely Able Feed Self Completely Able Walk Completely Able Get In / Out Bed Completely Able Housework Completely Able Prepare Meals Completely  [place] for Self Completely Able Electronic Signature(s) Signed: 02/14/2017 4:00:26 PM By: Montey Hora Entered By: Montey Hora on 02/14/2017 13:55:05 [name] ([id number]) -------------------------------------------------------------------------------- Education Assessment Details Patient Name: [name] Date of Service: 02/14/2017 1:30 PM Medical Record Number: [id number] Patient Account Number: [id number] Date of Birth/Sex: September 06, 1985 (31 y.o. Female) Treating RN: Montey Hora Primary Care Keylin Ferryman: [name] Other Clinician: Referring Izek Corvino: [name] Treating Jayliana Valencia/Extender: Melburn Hake, HOYT Weeks in Treatment: 0 Primary Learner Assessed: Patient Learning Preferences/Education Level/Primary Language Learning Preference: Explanation, Demonstration Highest Education Level: College or Above Preferred Language: English Cognitive Barrier Assessment/Beliefs Language Barrier: No Translator Needed: No Memory Deficit: No Emotional Barrier: No Cultural/Religious Beliefs Affecting Medical Care: No Physical Barrier Assessment Impaired Vision: No Impaired Hearing: No Decreased Hand dexterity: No Knowledge/Comprehension Assessment Knowledge Level: Medium Comprehension Level: Medium Ability to understand written Medium instructions: Ability to understand verbal Medium instructions: Motivation Assessment Anxiety Level: Calm Cooperation: Cooperative Education Importance: Acknowledges Need Interest in Health Problems: Asks Questions Perception: Coherent Willingness to Engage in Self- Medium Management Activities: Readiness to Engage in Self- Medium Management Activities: Electronic Signature(s) Signed: 02/14/2017 4:00:26 PM By: Montey Hora Entered By: Montey Hora on 02/14/2017 13:55:25 [name] ([id number]) -------------------------------------------------------------------------------- Fall Risk  Assessment Details Patient Name: [name] Date of Service: 02/14/2017 1:30 PM Medical Record Number: [id number] Patient Account Number: [id number] Date of Birth/Sex: [birth year] (31 y.o. Female) Treating RN: Montey Hora Primary Care Williom Cedar: [name] Other Clinician: Referring Kimberli Winne: [name] Treating Teran Knittle/Extender: Melburn Hake, HOYT Weeks in Treatment: 0 Fall Risk Assessment Items Have you had 2 or more falls in the last 12 monthso 0 No Have you had any fall that resulted in injury in the last 12 monthso 0 No FALL RISK ASSESSMENT: History of falling - immediate or within 3 months 0 No Secondary diagnosis 0 No Ambulatory aid None/bed rest/wheelchair/nurse 0 Yes Crutches/cane/walker 0 No Furniture  0 No IV Access/Saline Lock 0 No Gait/Training Normal/bed rest/immobile 0 Yes Weak 0 No Impaired 0 No Mental Status Oriented to own ability 0 Yes Electronic Signature(s) Signed: 02/14/2017 4:00:26 PM By: Montey Hora Entered By: Montey Hora on 02/14/2017 13:55:38 [name] ([id number]) -------------------------------------------------------------------------------- Foot Assessment Details Patient Name: [name] Date of Service: 02/14/2017 1:30 PM Medical Record Number: [id number] Patient Account Number: [id number] Date of Birth/Sex: 27-Jun-1985 (31 y.o. Female) Treating RN: Montey Hora Primary Care Delano Frate: [name] Other Clinician: Referring Eulis Salazar: [name] Treating Oluwatosin Higginson/Extender: STONE III, HOYT Weeks in Treatment: 0 Foot Assessment Items Site Locations + = Sensation present, - = Sensation absent, C = Callus, U = Ulcer R = Redness, W = Warmth, M = Maceration, PU = Pre-ulcerative lesion F = Fissure, S = Swelling, D = Dryness Assessment Right: Left: Other Deformity: No No Prior Foot Ulcer: No No Prior Amputation: No No Charcot Joint: No No Ambulatory Status: Ambulatory Without Help Gait: Steady Electronic  Signature(s) Signed: 02/14/2017 4:00:26 PM By: Montey Hora Entered By: Montey Hora on 02/14/2017 14:04:33 [name] ([id number]) -------------------------------------------------------------------------------- Nutrition Risk Assessment Details Patient Name: [name] Date of Service: 02/14/2017 1:30 PM Medical Record Number: [id number] Patient Account Number: [id number] Date of Birth/Sex: 10-27-1985 (31 y.o. Female) Treating RN: Montey Hora Primary Care Jackalyn Haith: [name] Other Clinician: Referring Darric Plante: [name] Treating Jashanti Clinkscale/Extender: STONE III, HOYT Weeks in Treatment: 0 Height (in): 66 Weight (lbs): 174 Body Mass Index (BMI): 28.1 Nutrition Risk Assessment Items NUTRITION RISK SCREEN: I have an illness or condition that made me change the kind and/or amount of 0 No food I eat I eat fewer than two meals per day 0 No I eat few fruits and vegetables, or milk products 0 No I have three or more drinks of beer, liquor or wine almost every day 0 No I have tooth or mouth problems that make it hard for me to eat 0 No I don't always have enough money to buy the food I need 0 No I eat alone most of the time 0 No I take three or more different prescribed or over-the-counter drugs a day 1 Yes Without wanting to, I have lost or gained 10 pounds in the last six months 0 No I am not always physically able to shop, [place] and/or feed myself 0 No Nutrition Protocols Good Risk Protocol 0 No interventions needed Moderate Risk Protocol Electronic Signature(s) Signed: 02/14/2017 4:00:26 PM By: Montey Hora Entered By: Montey Hora on 02/14/2017 13:55:48

## 2017-02-15 NOTE — Progress Notes (Signed)
Valerie, [name] ([id number]) Visit Report for 02/14/2017 Allergy List Details Patient Name: Valerie, [name] Date of Service: 02/14/2017 1:30 PM Medical Record Number: [id number] Patient Account Number: [id number] Date of Birth/Sex: 03-12-1986 (31 y.o. Female) Treating RN: Montey Hora Primary Care Tangia Pinard: [name] Other Clinician: Referring Clide Remmers: [name] Treating Jakari Sada/Extender: STONE III, HOYT Weeks in Treatment: 0 Allergies Active Allergies shellfish Benadryl Reaction: anaphalaxis Severity: Severe banana nitrofurantoin Infed Allergy Notes Electronic Signature(s) Signed: 02/14/2017 4:00:26 PM By: Montey Hora Entered By: Montey Hora on 02/14/2017 13:51:25 [name] ([id number]) -------------------------------------------------------------------------------- Arrival Information Details Patient Name: [name] Date of Service: 02/14/2017 1:30 PM Medical Record Number: [id number] Patient Account Number: [id number] Date of Birth/Sex: 1985/07/31 (31 y.o. Female) Treating RN: Montey Hora Primary Care Dixon Luczak: [name] Other Clinician: Referring Rudolph Daoust: [name] Treating Harveen Flesch/Extender: Melburn Hake, HOYT Weeks in Treatment: 0 Visit Information Patient Arrived: Ambulatory Arrival Time: 13:46 Accompanied By: self Transfer Assistance: None Patient Identification Verified: Yes Secondary Verification Process Yes Completed: Patient Has Alerts: Yes Patient Alerts: DMII REFUSED FACE PHOTO History Since Last Visit Added or deleted any medications: No Any new allergies or adverse reactions: No Had a fall or experienced change in activities of daily living that may affect risk of falls: No Signs or symptoms of abuse/neglect since last visito No Hospitalized since last visit: No Electronic Signature(s) Signed: 02/14/2017 4:00:26 PM By: Montey Hora Entered By: Montey Hora on 02/14/2017 13:46:50 [name]  ([id number]) -------------------------------------------------------------------------------- Clinic Level of Care Assessment Details Patient Name: [name] Date of Service: 02/14/2017 1:30 PM Medical Record Number: [id number] Patient Account Number: [id number] Date of Birth/Sex: 04-May-1985 (31 y.o. Female) Treating RN: Montey Hora Primary Care Carlon Chaloux: [name] Other Clinician: Referring Ladan Vanderzanden: [name] Treating Anahy Esh/Extender: STONE III, HOYT Weeks in Treatment: 0 Clinic Level of Care Assessment Items TOOL 1 Quantity Score []  - Use when EandM and Procedure is performed on INITIAL visit 0 ASSESSMENTS - Nursing Assessment / Reassessment X - General Physical Exam (combine w/ comprehensive assessment (listed just below) when 1 20 performed on new pt. evals) X- 1 25 Comprehensive Assessment (HX, ROS, Risk Assessments, Wounds Hx, etc.) ASSESSMENTS - Wound and Skin Assessment / Reassessment []  - Dermatologic / Skin Assessment (not related to wound area) 0 ASSESSMENTS - Ostomy and/or Continence Assessment and Care []  - Incontinence Assessment and Management 0 []  - 0 Ostomy Care Assessment and Management (repouching, etc.) PROCESS - Coordination of Care X - Simple Patient / Family Education for ongoing care 1 15 []  - 0 Complex (extensive) Patient / Family Education for ongoing care X- 1 10 Staff obtains Programmer, systems, Records, Test Results / Process Orders []  - 0 Staff telephones HHA, Nursing Homes / Clarify orders / etc []  - 0 Routine Transfer to another Facility (non-emergent condition) []  - 0 Routine Hospital Admission (non-emergent condition) X- 1 15 New Admissions / Biomedical engineer / Ordering NPWT, Apligraf, etc. []  - 0 Emergency Hospital Admission (emergent condition) PROCESS - Special Needs []  - Pediatric / Minor Patient Management 0 []  - 0 Isolation Patient Management []  - 0 Hearing / Language / Visual special needs []  - 0 Assessment of  Community assistance (transportation, D/C planning, etc.) []  - 0 Additional assistance / Altered mentation []  - 0 Support Surface(s) Assessment (bed, cushion, seat, etc.) Valerie, MELENA. ([id number]) INTERVENTIONS - Miscellaneous []  - External ear exam 0 []  - 0 Patient Transfer (multiple staff / Civil Service fast streamer / Similar devices) []  - 0 Simple Staple / Suture removal (  25 or less) []  - 0 Complex Staple / Suture removal (26 or more) []  - 0 Hypo/Hyperglycemic Management (do not check if billed separately) X- 1 15 Ankle / Brachial Index (ABI) - do not check if billed separately Has the patient been seen at the hospital within the last three years: Yes Total Score: 100 Level Of Care: New/Established - Level 3 Electronic Signature(s) Signed: 02/14/2017 4:00:26 PM By: Montey Hora Entered By: Montey Hora on 02/14/2017 14:52:21 [name] ([id number]) -------------------------------------------------------------------------------- Encounter Discharge Information Details Patient Name: [name] Date of Service: 02/14/2017 1:30 PM Medical Record Number: [id number] Patient Account Number: [id number] Date of Birth/Sex: March 03, 1986 (31 y.o. Female) Treating RN: Montey Hora Primary Care Jesyca Weisenburger: [name] Other Clinician: Referring Phuong Moffatt: [name] Treating Hence Derrick/Extender: Melburn Hake, HOYT Weeks in Treatment: 0 Encounter Discharge Information Items Discharge Pain Level: 0 Discharge Condition: Stable Ambulatory Status: Ambulatory Discharge Destination: Home Transportation: Private Auto Accompanied By: self Schedule Follow-up Appointment: Yes Medication Reconciliation completed and No provided to Patient/Care Jhane Lorio: Provided on Clinical Summary of Care: 02/14/2017 Form Type Recipient Paper Patient DC Electronic Signature(s) Signed: 02/14/2017 2:53:31 PM By: Montey Hora Entered By: Montey Hora on 02/14/2017 14:53:30 [name]  ([id number]) -------------------------------------------------------------------------------- Lower Extremity Assessment Details Patient Name: [name] Date of Service: 02/14/2017 1:30 PM Medical Record Number: [id number] Patient Account Number: [id number] Date of Birth/Sex: June 05, 1985 (31 y.o. Female) Treating RN: Montey Hora Primary Care Lehua Flores: [name] Other Clinician: Referring Kaylia Winborne: [name] Treating Harkirat Orozco/Extender: STONE III, HOYT Weeks in Treatment: 0 Edema Assessment Assessed: [Left: No] [Right: No] Edema: [Left: No] [Right: No] Vascular Assessment Pulses: Dorsalis Pedis Palpable: [Left:Yes] [Right:Yes] Doppler Audible: [Left:Yes] [Right:Yes] Posterior Tibial Palpable: [Left:Yes] [Right:Yes] Doppler Audible: [Left:Yes] [Right:Yes] Extremity colors, hair growth, and conditions: Extremity Color: [Left:Normal] [Right:Normal] Hair Growth on Extremity: [Left:No] [Right:No] Temperature of Extremity: [Left:Warm] [Right:Warm] Capillary Refill: [Left:< 3 seconds] [Right:< 3 seconds] Blood Pressure: Brachial: [Left:108] [Right:106] Dorsalis Pedis: 116 [Left:Dorsalis Pedis: 120] Ankle: Posterior Tibial: 110 [Left:Posterior Tibial: 112 1.07] [Right:1.11] Toe Nail Assessment Left: Right: Thick: Yes Yes Discolored: No No Deformed: No No Improper Length and Hygiene: No No Electronic Signature(s) Signed: 02/14/2017 4:00:26 PM By: Montey Hora Entered By: Montey Hora on 02/14/2017 14:11:32 [name] ([id number]) -------------------------------------------------------------------------------- Multi Wound Chart Details Patient Name: [name] Date of Service: 02/14/2017 1:30 PM Medical Record Number: [id number] Patient Account Number: [id number] Date of Birth/Sex: 1985-08-19 (31 y.o. Female) Treating RN: Montey Hora Primary Care Satya Buttram: [name] Other Clinician: Referring Maizee Reinhold: [name] Treating Abanoub Hanken/Extender:  STONE III, HOYT Weeks in Treatment: 0 Vital Signs Height(in): 71 Pulse(bpm): 101 Weight(lbs): 174 Blood Pressure(mmHg): 112/75 Body Mass Index(BMI): 28 Temperature(F): 98.5 Respiratory Rate 18 (breaths/min): Photos: [2:No Photos] [N/A:N/A] Wound Location: [2:Right Toe Great] [N/A:N/A] Wounding Event: [2:Blister] [N/A:N/A] Primary Etiology: [2:Diabetic Wound/Ulcer of the Lower Extremity] [N/A:N/A] Comorbid History: [2:Anemia, Hypertension, Type II Diabetes, Osteoarthritis, Seizure Disorder] [N/A:N/A] Date Acquired: [2:01/24/2017] [N/A:N/A] Weeks of Treatment: [2:0] [N/A:N/A] Wound Status: [2:Open] [N/A:N/A] Pending Amputation on [2:Yes] [N/A:N/A] Presentation: Measurements L x W x D [2:0.8x1.2x0.1] [N/A:N/A] (cm) Area (cm) : [2:0.754] [N/A:N/A] Volume (cm) : [2:0.075] [N/A:N/A] Classification: [2:Grade 2] [N/A:N/A] Exudate Amount: [2:Large] [N/A:N/A] Exudate Type: [2:Serous] [N/A:N/A] Exudate Color: [2:amber] [N/A:N/A] Wound Margin: [2:Flat and Intact] [N/A:N/A] Granulation Amount: [2:Large (67-100%)] [N/A:N/A] Granulation Quality: [2:Pink] [N/A:N/A] Necrotic Amount: [2:Small (1-33%)] [N/A:N/A] Necrotic Tissue: [2:Eschar, Adherent Slough] [N/A:N/A] Exposed Structures: [2:Fat Layer (Subcutaneous Tissue) Exposed: Yes Fascia: No Tendon: No Muscle: No Joint: No Bone: No] [N/A:N/A] Epithelialization: [2:None] [N/A:N/A] Periwound  Skin Texture: [2:Excoriation: No Induration: No Callus: No] [N/A:N/A] Crepitus: No Rash: No Scarring: No Periwound Skin Moisture: Maceration: No N/A N/A Dry/Scaly: No Periwound Skin Color: Atrophie Blanche: No N/A N/A Cyanosis: No Ecchymosis: No Erythema: No Hemosiderin Staining: No Mottled: No Pallor: No Rubor: No Temperature: No Abnormality N/A N/A Tenderness on Palpation: Yes N/A N/A Wound Preparation: Ulcer Cleansing: N/A N/A Rinsed/Irrigated with Saline Topical Anesthetic Applied: Other: lidocaine 4% Treatment  Notes Electronic Signature(s) Signed: 02/14/2017 4:00:26 PM By: Montey Hora Entered By: Montey Hora on 02/14/2017 14:23:44 [name] ([id number]) -------------------------------------------------------------------------------- [place] Details Patient Name: [name] Date of Service: 02/14/2017 1:30 PM Medical Record Number: [id number] Patient Account Number: [id number] Date of Birth/Sex: 12/02/1985 (31 y.o. Female) Treating RN: Montey Hora Primary Care Preesha Benjamin: [name] Other Clinician: Referring Jameya Pontiff: [name] Treating Xan Ingraham/Extender: Melburn Hake, HOYT Weeks in Treatment: 0 Active Inactive ` Orientation to the Wound Care Program Nursing Diagnoses: Knowledge deficit related to the wound healing center program Goals: Patient/caregiver will verbalize understanding of the [facility] Date Initiated: 02/14/2017 Target Resolution Date: 05/21/2017 Goal Status: Active Interventions: Provide education on orientation to the wound center Notes: ` Wound/Skin Impairment Nursing Diagnoses: Impaired tissue integrity Goals: Ulcer/skin breakdown will heal within 14 weeks Date Initiated: 02/14/2017 Target Resolution Date: 05/21/2017 Goal Status: Active Interventions: Assess patient/caregiver ability to obtain necessary supplies Assess patient/caregiver ability to perform ulcer/skin care regimen upon admission and as needed Assess ulceration(s) every visit Notes: Electronic Signature(s) Signed: 02/14/2017 4:00:26 PM By: Montey Hora Entered By: Montey Hora on 02/14/2017 14:23:31 [name] ([id number]) -------------------------------------------------------------------------------- Pain Assessment Details Patient Name: [name] Date of Service: 02/14/2017 1:30 PM Medical Record Number: [id number] Patient Account Number: [id number] Date of Birth/Sex: 05-04-1985 (31 y.o. Female) Treating RN: Montey Hora Primary Care Shanikka Wonders: [name] Other Clinician: Referring Quinlan Mcfall: [name] Treating Mykenzie Ebanks/Extender: STONE III, HOYT Weeks in Treatment: 0 Active Problems Location of Pain Severity and Description of Pain Patient Has Paino Yes Site Locations Pain Management and Medication Current Pain Management: Notes Topical or injectable lidocaine is offered to patient for acute pain when surgical debridement is performed. If needed, Patient is instructed to use over the counter pain medication for the following 24-48 hours after debridement. Wound care MDs do not prescribed pain medications. Patient has chronic pain or uncontrolled pain. Patient has been instructed to make an appointment with their Primary Care Physician for pain management. Electronic Signature(s) Signed: 02/14/2017 4:00:26 PM By: Montey Hora Entered By: Montey Hora on 02/14/2017 13:47:12 [name] ([id number]) -------------------------------------------------------------------------------- Patient/Caregiver Education Details Patient Name: [name] Date of Service: 02/14/2017 1:30 PM Medical Record Number: [id number] Patient Account Number: [id number] Date of Birth/Gender: [birth year] (31 y.o. Female) Treating RN: Montey Hora Primary Care Physician: [name] Other Clinician: Referring Physician: [name] Treating Physician/Extender: Sharalyn Ink in Treatment: 0 Education Assessment Education Provided To: Patient Education Topics Provided Wound/Skin Impairment: Handouts: Other: wound care as ordered Methods: Demonstration, Explain/Verbal Responses: State content correctly Electronic Signature(s) Signed: 02/14/2017 4:00:26 PM By: Montey Hora Entered By: Montey Hora on 02/14/2017 14:53:49 [name] ([id number]) -------------------------------------------------------------------------------- Wound Assessment Details Patient Name: [name] Date of Service: 02/14/2017 1:30 PM Medical Record Number: [id number] Patient Account Number: [id number] Date of Birth/Sex: [birth year] (31 y.o. Female) Treating RN: Montey Hora Primary Care Chayim Bialas: [name] Other Clinician: Referring Carri Spillers: [name] Treating Valon Glasscock/Extender: STONE III, HOYT Weeks in Treatment: 0 Wound Status Wound Number: 2 Primary Diabetic Wound/Ulcer  of the Lower Extremity Etiology: Wound Location: Right Toe Great Wound Open Wounding Event: Blister Status: Date Acquired: 01/24/2017 Comorbid Anemia, Hypertension, Type II Diabetes, Weeks Of Treatment: 0 History: Osteoarthritis, Seizure Disorder Clustered Wound: No Pending Amputation On Presentation Photos Photo Uploaded By: Montey Hora on 02/14/2017 15:46:57 Wound Measurements Length: (cm) 0.8 Width: (cm) 1.2 Depth: (cm) 0.1 Area: (cm) 0.754 Volume: (cm) 0.075 % Reduction in Area: % Reduction in Volume: Epithelialization: None Tunneling: No Undermining: No Wound Description Classification: Grade 2 Wound Margin: Flat and Intact Exudate Amount: Large Exudate Type: Serous Exudate Color: amber Foul Odor After Cleansing: No Slough/Fibrino Yes Wound Bed Granulation Amount: Large (67-100%) Exposed Structure Granulation Quality: Pink Fascia Exposed: No Necrotic Amount: Small (1-33%) Fat Layer (Subcutaneous Tissue) Exposed: Yes Necrotic Quality: Eschar, Adherent Slough Tendon Exposed: No Muscle Exposed: No Joint Exposed: No Bone Exposed: No Periwound Skin Texture OKEMA, ROLLINSON. ([id number]) Texture Color No Abnormalities Noted: No No Abnormalities Noted: No Callus: No Atrophie Blanche: No Crepitus: No Cyanosis: No Excoriation: No Ecchymosis: No Induration: No Erythema: No Rash: No Hemosiderin Staining: No Scarring: No Mottled: No Pallor: No Moisture Rubor: No No Abnormalities Noted: No Dry / Scaly: No Temperature / Pain Maceration: No Temperature: No  Abnormality Tenderness on Palpation: Yes Wound Preparation Ulcer Cleansing: Rinsed/Irrigated with Saline Topical Anesthetic Applied: Other: lidocaine 4%, Treatment Notes Wound #2 (Right Toe Great) 1. Cleansed with: Clean wound with Normal Saline 2. Anesthetic Topical Lidocaine 4% cream to wound bed prior to debridement 4. Dressing Applied: Other dressing (specify in notes) 5. Secondary Dressing Applied Foam Kerlix/Conform 7. Secured with Tape Notes silvercel Electronic Signature(s) Signed: 02/14/2017 4:00:26 PM By: Montey Hora Entered By: Montey Hora on 02/14/2017 14:01:42 [name] ([id number]) -------------------------------------------------------------------------------- Vitals Details Patient Name: [name] Date of Service: 02/14/2017 1:30 PM Medical Record Number: [id number] Patient Account Number: [id number] Date of Birth/Sex: [birth year] (31 y.o. Female) Treating RN: Montey Hora Primary Care Eleah Lahaie: [name] Other Clinician: Referring Tamisha Nordstrom: [name] Treating Germani Gavilanes/Extender: STONE III, HOYT Weeks in Treatment: 0 Vital Signs Time Taken: 13:47 Temperature (F): 98.5 Height (in): 66 Pulse (bpm): 101 Source: Measured Respiratory Rate (breaths/min): 18 Weight (lbs): 174 Blood Pressure (mmHg): 112/75 Source: Measured Reference Range: 80 - 120 mg / dl Body Mass Index (BMI): 28.1 Electronic Signature(s) Signed: 02/14/2017 4:00:26 PM By: Montey Hora Entered By: Montey Hora on 02/14/2017 13:51:07
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
## 2017-02-17 ENCOUNTER — Encounter (HOSPITAL_COMMUNITY)
Admission: RE | Admit: 2017-02-17 | Discharge: 2017-02-17 | Disposition: A | Discharge status: Home / Self Care | Payer: Medicare Other | Source: Ambulatory Visit | Attending: Nephrology | Admitting: Nephrology

## 2017-02-17 VITALS — BP 120/90 | HR 95 | Temp 98.3°F | Resp 18

## 2017-02-17 DIAGNOSIS — D631 Anemia in chronic kidney disease: Secondary | ICD-10-CM | POA: Diagnosis not present

## 2017-02-17 DIAGNOSIS — N183 Chronic kidney disease, stage 3 (moderate): Secondary | ICD-10-CM | POA: Diagnosis not present

## 2017-02-17 DIAGNOSIS — N182 Chronic kidney disease, stage 2 (mild): Secondary | ICD-10-CM

## 2017-02-17 LAB — POCT HEMOGLOBIN-HEMACUE: Hemoglobin: 10 g/dL — ABNORMAL LOW (ref 12.0–15.0)

## 2017-02-17 MED ORDER — EPOETIN ALFA 40000 UNIT/ML IJ SOLN: 30000.0000 [IU] | INTRAMUSCULAR | Status: DC

## 2017-02-17 MED ORDER — EPOETIN ALFA 10000 UNIT/ML IJ SOLN
INTRAMUSCULAR | Status: AC
  Administered 2017-02-17: 10000 [IU]
  Filled 2017-02-17: qty 1

## 2017-02-17 MED ORDER — EPOETIN ALFA 20000 UNIT/ML IJ SOLN
INTRAMUSCULAR | Status: AC
  Administered 2017-02-17: 20000 [IU]
  Filled 2017-02-17: qty 1

## 2017-02-24 ENCOUNTER — Encounter: Payer: Medicare Other | Admitting: Surgery

## 2017-02-24 DIAGNOSIS — Z940 Kidney transplant status: Secondary | ICD-10-CM | POA: Diagnosis not present

## 2017-02-24 DIAGNOSIS — E785 Hyperlipidemia, unspecified: Secondary | ICD-10-CM | POA: Diagnosis not present

## 2017-02-24 DIAGNOSIS — I10 Essential (primary) hypertension: Secondary | ICD-10-CM | POA: Diagnosis not present

## 2017-02-24 DIAGNOSIS — T3110 Burns involving 10-19% of body surface with 0% to 9% third degree burns: Secondary | ICD-10-CM | POA: Diagnosis not present

## 2017-02-24 DIAGNOSIS — L97512 Non-pressure chronic ulcer of other part of right foot with fat layer exposed: Secondary | ICD-10-CM | POA: Diagnosis not present

## 2017-02-24 DIAGNOSIS — E11621 Type 2 diabetes mellitus with foot ulcer: Secondary | ICD-10-CM | POA: Diagnosis not present

## 2017-02-26 NOTE — Progress Notes (Signed)
IVANNA, KOCAK ([id number]) Visit Report for 02/24/2017 Arrival Information Details Patient Name: [name], [name] Date of Service: 02/24/2017 12:30 PM Medical Record Number: [id number] Patient Account Number: [id number] Date of Birth/Sex: 1985-04-09 (31 y.o. Female) Treating RN: Montey Hora Primary Care Pierre Dellarocco: [name] Other Clinician: Referring Renelle Stegenga: [name] Treating Momen Ham/Extender: Frann Rider in Treatment: 1 Visit Information History Since Last Visit Added or deleted any medications: No Patient Arrived: Ambulatory Any new allergies or adverse reactions: No Arrival Time: 12:40 Had a fall or experienced change in No Accompanied By: self activities of daily living that may affect Transfer Assistance: None risk of falls: Patient Identification Verified: Yes Signs or symptoms of abuse/neglect since last visito No Secondary Verification Process Yes Hospitalized since last visit: No Completed: Has Dressing in Place as Prescribed: Yes Patient Has Alerts: Yes Pain Present Now: No Patient Alerts: DMII REFUSED FACE PHOTO Electronic Signature(s) Signed: 02/24/2017 4:52:42 PM By: Montey Hora Entered By: Montey Hora on 02/24/2017 12:44:43 [name] ([id number]) -------------------------------------------------------------------------------- Encounter Discharge Information Details Patient Name: [name] Date of Service: 02/24/2017 12:30 PM Medical Record Number: [id number] Patient Account Number: [id number] Date of Birth/Sex: January 07, 1986 (31 y.o. Female) Treating RN: Montey Hora Primary Care Assia Meanor: [name] Other Clinician: Referring Odeth Bry: [name] Treating Deyon Chizek/Extender: Frann Rider in Treatment: 1 Encounter Discharge Information Items Discharge Pain Level: 0 Discharge Condition: Stable Ambulatory Status: Ambulatory Discharge Destination: Home Transportation: Private Auto Accompanied By:  self Schedule Follow-up Appointment: Yes Medication Reconciliation completed and No provided to Patient/Care Letica Giaimo: Provided on Clinical Summary of Care: 02/24/2017 Form Type Recipient Paper Patient DC Electronic Signature(s) Signed: 02/24/2017 4:52:42 PM By: Montey Hora Entered By: Montey Hora on 02/24/2017 14:54:46 [name] ([id number]) -------------------------------------------------------------------------------- Lower Extremity Assessment Details Patient Name: [name] Date of Service: 02/24/2017 12:30 PM Medical Record Number: [id number] Patient Account Number: [id number] Date of Birth/Sex: Sep 13, 1985 (31 y.o. Female) Treating RN: Montey Hora Primary Care Garrette Caine: [name] Other Clinician: Referring Ocia Simek: [name] Treating Audia Amick/Extender: Frann Rider in Treatment: 1 Edema Assessment Assessed: [Left: No] [Right: No] Edema: [Left: N] [Right: o] Vascular Assessment Pulses: Dorsalis Pedis Palpable: [Right:Yes] Posterior Tibial Extremity colors, hair growth, and conditions: Extremity Color: [Right:Normal] Hair Growth on Extremity: [Right:Yes] Temperature of Extremity: [Right:Warm] Capillary Refill: [Right:< 3 seconds] Electronic Signature(s) Signed: 02/24/2017 4:52:42 PM By: Montey Hora Entered By: Montey Hora on 02/24/2017 12:52:10 [name] ([id number]) -------------------------------------------------------------------------------- Multi Wound Chart Details Patient Name: [name] Date of Service: 02/24/2017 12:30 PM Medical Record Number: [id number] Patient Account Number: [id number] Date of Birth/Sex: 1985/05/21 (31 y.o. Female) Treating RN: Montey Hora Primary Care Latrece Nitta: [name] Other Clinician: Referring Stefen Juba: [name] Treating Gearl Kimbrough/Extender: Frann Rider in Treatment: 1 Vital Signs Height(in): 47 Pulse(bpm): 61 Weight(lbs): 174 Blood Pressure(mmHg):  122/79 Body Mass Index(BMI): 28 Temperature(F): 98.7 Respiratory Rate 16 (breaths/min): Photos: [2:No Photos] [N/A:N/A] Wound Location: [2:Right Toe Great] [N/A:N/A] Wounding Event: [2:Blister] [N/A:N/A] Primary Etiology: [2:Diabetic Wound/Ulcer of the Lower Extremity] [N/A:N/A] Comorbid History: [2:Anemia, Hypertension, Type II Diabetes, Osteoarthritis, Seizure Disorder] [N/A:N/A] Date Acquired: [2:01/24/2017] [N/A:N/A] Weeks of Treatment: [2:1] [N/A:N/A] Wound Status: [2:Open] [N/A:N/A] Pending Amputation on [2:Yes] [N/A:N/A] Presentation: Measurements L x W x D [2:0.8x0.3x0.1] [N/A:N/A] (cm) Area (cm) : [2:0.188] [N/A:N/A] Volume (cm) : [2:0.019] [N/A:N/A] % Reduction in Area: [2:75.10%] [N/A:N/A] % Reduction in Volume: [2:74.70%] [N/A:N/A] Classification: [2:Grade 2] [N/A:N/A] Exudate Amount: [2:Large] [N/A:N/A] Exudate Type: [2:Serous] [N/A:N/A] Exudate Color: [2:amber] [N/A:N/A] Wound Margin: [2:Flat and Intact] [N/A:N/A] Granulation Amount: [2:Large (  67-100%)] [N/A:N/A] Granulation Quality: [2:Pink] [N/A:N/A] Necrotic Amount: [2:Small (1-33%)] [N/A:N/A] Necrotic Tissue: [2:Eschar, Adherent Slough] [N/A:N/A] Exposed Structures: [2:Fat Layer (Subcutaneous Tissue) Exposed: Yes Fascia: No Tendon: No Muscle: No Joint: No Bone: No] [N/A:N/A] Epithelialization: [2:None] [N/A:N/A] Debridement: [2:Debridement ([id number])] [N/A:N/A] Pre-procedure 13:08 N/A N/A Verification/Time Out Taken: Pain Control: Lidocaine 4% Topical Solution N/A N/A Tissue Debrided: Fibrin/Slough, Callus, N/A N/A Subcutaneous Level: Skin/Subcutaneous Tissue N/A N/A Debridement Area (sq cm): 0.24 N/A N/A Instrument: Curette N/A N/A Bleeding: Minimum N/A N/A Hemostasis Achieved: Pressure N/A N/A Procedural Pain: 0 N/A N/A Post Procedural Pain: 0 N/A N/A Debridement Treatment Procedure was tolerated well N/A N/A Response: Post Debridement 0.8x0.3x0.2 N/A N/A Measurements L x W x  D (cm) Post Debridement Volume: 0.038 N/A N/A (cm) Periwound Skin Texture: Callus: Yes N/A N/A Excoriation: No Induration: No Crepitus: No Rash: No Scarring: No Periwound Skin Moisture: Maceration: No N/A N/A Dry/Scaly: No Periwound Skin Color: Atrophie Blanche: No N/A N/A Cyanosis: No Ecchymosis: No Erythema: No Hemosiderin Staining: No Mottled: No Pallor: No Rubor: No Temperature: No Abnormality N/A N/A Tenderness on Palpation: Yes N/A N/A Wound Preparation: Ulcer Cleansing: N/A N/A Rinsed/Irrigated with Saline Topical Anesthetic Applied: Other: lidocaine 4% Procedures Performed: Debridement N/A N/A Treatment Notes Electronic Signature(s) Signed: 02/24/2017 1:19:46 PM By: Christin Fudge MD, FACS Entered By: Christin Fudge on 02/24/2017 13:19:46 [name] ([id number]) -------------------------------------------------------------------------------- [place] Details Patient Name: [name] Date of Service: 02/24/2017 12:30 PM Medical Record Number: [id number] Patient Account Number: [id number] Date of Birth/Sex: 18-May-1985 (31 y.o. Female) Treating RN: Montey Hora Primary Care Lazer Wollard: [name] Other Clinician: Referring Sophiea Ueda: [name] Treating Camilah Spillman/Extender: Frann Rider in Treatment: 1 Active Inactive ` Orientation to the Wound Care Program Nursing Diagnoses: Knowledge deficit related to the wound healing center program Goals: Patient/caregiver will verbalize understanding of the [facility] Date Initiated: 02/14/2017 Target Resolution Date: 05/21/2017 Goal Status: Active Interventions: Provide education on orientation to the wound center Notes: ` Wound/Skin Impairment Nursing Diagnoses: Impaired tissue integrity Goals: Ulcer/skin breakdown will heal within 14 weeks Date Initiated: 02/14/2017 Target Resolution Date: 05/21/2017 Goal Status: Active Interventions: Assess  patient/caregiver ability to obtain necessary supplies Assess patient/caregiver ability to perform ulcer/skin care regimen upon admission and as needed Assess ulceration(s) every visit Notes: Electronic Signature(s) Signed: 02/24/2017 4:52:42 PM By: Montey Hora Entered By: Montey Hora on 02/24/2017 12:52:14 [name] ([id number]) -------------------------------------------------------------------------------- Pain Assessment Details Patient Name: [name] Date of Service: 02/24/2017 12:30 PM Medical Record Number: [id number] Patient Account Number: [id number] Date of Birth/Sex: 1985-10-03 (31 y.o. Female) Treating RN: Montey Hora Primary Care Aaryan Essman: [name] Other Clinician: Referring Ernestine Rohman: [name] Treating Vrinda Heckstall/Extender: Frann Rider in Treatment: 1 Active Problems Location of Pain Severity and Description of Pain Patient Has Paino No Site Locations Pain Management and Medication Current Pain Management: Electronic Signature(s) Signed: 02/24/2017 4:52:42 PM By: Montey Hora Entered By: Montey Hora on 02/24/2017 12:44:51 [name] ([id number]) -------------------------------------------------------------------------------- Patient/Caregiver Education Details Patient Name: [name] Date of Service: 02/24/2017 12:30 PM Medical Record Number: [id number] Patient Account Number: [id number] Date of Birth/Gender: 03/01/1986 (31 y.o. Female) Treating RN: Montey Hora Primary Care Physician: [name] Other Clinician: Referring Physician: [name] Treating Physician/Extender: Frann Rider in Treatment: 1 Education Assessment Education Provided To: Patient Education Topics Provided Wound/Skin Impairment: Handouts: Other: wound care as ordered Methods: Demonstration, Explain/Verbal Responses: State content correctly Electronic Signature(s) Signed: 02/24/2017 4:52:42 PM By: Montey Hora Entered By: Montey Hora on  02/24/2017 14:55:11 [name], [name] ([id number]) -------------------------------------------------------------------------------- Wound Assessment Details Patient Name: [name], [name] Date of Service: 02/24/2017 12:30 PM Medical Record Number: [id number] Patient Account Number: [id number] Date of Birth/Sex: [birth year] (31 y.o. Female) Treating RN: Montey Hora Primary Care Karthik Whittinghill: [name] Other Clinician: Referring Sabrine Patchen: [name] Treating Margerie Fraiser/Extender: Frann Rider in Treatment: 1 Wound Status Wound Number: 2 Primary Diabetic Wound/Ulcer of the Lower Extremity Etiology: Wound Location: Right Toe Great Wound Open Wounding Event: Blister Status: Date Acquired: 01/24/2017 Comorbid Anemia, Hypertension, Type II Diabetes, Weeks Of Treatment: 1 History: Osteoarthritis, Seizure Disorder Clustered Wound: No Pending Amputation On Presentation Photos Photo Uploaded By: Montey Hora on 02/25/2017 08:01:19 Wound Measurements Length: (cm) 0.8 Width: (cm) 0.3 Depth: (cm) 0.1 Area: (cm) 0.188 Volume: (cm) 0.019 % Reduction in Area: 75.1% % Reduction in Volume: 74.7% Epithelialization: None Tunneling: No Undermining: No Wound Description Classification: Grade 2 Wound Margin: Flat and Intact Exudate Amount: Large Exudate Type: Serous Exudate Color: amber Foul Odor After Cleansing: No Slough/Fibrino Yes Wound Bed Granulation Amount: Large (67-100%) Exposed Structure Granulation Quality: Pink Fascia Exposed: No Necrotic Amount: Small (1-33%) Fat Layer (Subcutaneous Tissue) Exposed: Yes Necrotic Quality: Eschar, Adherent Slough Tendon Exposed: No Muscle Exposed: No Joint Exposed: No Bone Exposed: No Periwound Skin Texture [name], [name]. ([id number]) Texture Color No Abnormalities Noted: No No Abnormalities Noted: No Callus: Yes Atrophie Blanche: No Crepitus: No Cyanosis: No Excoriation:  No Ecchymosis: No Induration: No Erythema: No Rash: No Hemosiderin Staining: No Scarring: No Mottled: No Pallor: No Moisture Rubor: No No Abnormalities Noted: No Dry / Scaly: No Temperature / Pain Maceration: No Temperature: No Abnormality Tenderness on Palpation: Yes Wound Preparation Ulcer Cleansing: Rinsed/Irrigated with Saline Topical Anesthetic Applied: Other: lidocaine 4%, Treatment Notes Wound #2 (Right Toe Great) 1. Cleansed with: Clean wound with Normal Saline 2. Anesthetic Topical Lidocaine 4% cream to wound bed prior to debridement 4. Dressing Applied: Prisma Ag 5. Secondary Dressing Applied Dry Gauze Foam Kerlix/Conform 7. Secured with Recruitment consultant) Signed: 02/24/2017 4:52:42 PM By: Montey Hora Entered By: Montey Hora on 02/24/2017 12:51:50 [name] ([id number]) -------------------------------------------------------------------------------- Vitals Details Patient Name: [name] Date of Service: 02/24/2017 12:30 PM Medical Record Number: [id number] Patient Account Number: [id number] Date of Birth/Sex: 04-13-1985 (31 y.o. Female) Treating RN: Montey Hora Primary Care Gyselle Matthew: [name] Other Clinician: Referring Nikola Marone: [name] Treating Gearl Kimbrough/Extender: Frann Rider in Treatment: 1 Vital Signs Time Taken: 12:51 Temperature (F): 98.7 Height (in): 66 Pulse (bpm): 69 Weight (lbs): 174 Respiratory Rate (breaths/min): 16 Body Mass Index (BMI): 28.1 Blood Pressure (mmHg): 122/79 Reference Range: 80 - 120 mg / dl Electronic Signature(s) Signed: 02/24/2017 4:52:42 PM By: Montey Hora Entered By: Montey Hora on 02/24/2017 12:51:37
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
## 2017-03-03 ENCOUNTER — Encounter (HOSPITAL_COMMUNITY)
Admission: RE | Admit: 2017-03-03 | Discharge: 2017-03-03 | Disposition: A | Discharge status: Home / Self Care | Payer: Medicare Other | Source: Ambulatory Visit | Attending: Nephrology | Admitting: Nephrology

## 2017-03-03 VITALS — BP 140/93 | HR 93 | Resp 16

## 2017-03-03 DIAGNOSIS — N182 Chronic kidney disease, stage 2 (mild): Secondary | ICD-10-CM

## 2017-03-03 DIAGNOSIS — N183 Chronic kidney disease, stage 3 (moderate): Secondary | ICD-10-CM | POA: Diagnosis not present

## 2017-03-03 DIAGNOSIS — D631 Anemia in chronic kidney disease: Secondary | ICD-10-CM | POA: Diagnosis not present

## 2017-03-03 LAB — POCT HEMOGLOBIN-HEMACUE: Hemoglobin: 10.5 g/dL — ABNORMAL LOW (ref 12.0–15.0)

## 2017-03-03 MED ORDER — EPOETIN ALFA 40000 UNIT/ML IJ SOLN: 30000.0000 [IU] | INTRAMUSCULAR | Status: DC

## 2017-03-03 MED ORDER — EPOETIN ALFA 10000 UNIT/ML IJ SOLN
INTRAMUSCULAR | Status: AC
  Administered 2017-03-03: 10000 [IU]
  Filled 2017-03-03: qty 1

## 2017-03-03 MED ORDER — EPOETIN ALFA 20000 UNIT/ML IJ SOLN
INTRAMUSCULAR | Status: AC
  Administered 2017-03-03: 20000 [IU]
  Filled 2017-03-03: qty 1

## 2017-03-09 ENCOUNTER — Encounter: Payer: Self-pay | Admitting: Internal Medicine

## 2017-03-09 ENCOUNTER — Ambulatory Visit (INDEPENDENT_AMBULATORY_CARE_PROVIDER_SITE_OTHER): Payer: Medicare Other | Admitting: Internal Medicine

## 2017-03-09 VITALS — BP 106/78 | HR 72 | Ht 66.0 in | Wt 171.4 lb

## 2017-03-09 DIAGNOSIS — E1065 Type 1 diabetes mellitus with hyperglycemia: Secondary | ICD-10-CM

## 2017-03-09 DIAGNOSIS — R1115 Cyclical vomiting syndrome unrelated to migraine: Secondary | ICD-10-CM

## 2017-03-09 DIAGNOSIS — R197 Diarrhea, unspecified: Secondary | ICD-10-CM | POA: Diagnosis not present

## 2017-03-09 DIAGNOSIS — D899 Disorder involving the immune mechanism, unspecified: Secondary | ICD-10-CM | POA: Diagnosis not present

## 2017-03-09 DIAGNOSIS — E108 Type 1 diabetes mellitus with unspecified complications: Secondary | ICD-10-CM

## 2017-03-09 DIAGNOSIS — D849 Immunodeficiency, unspecified: Secondary | ICD-10-CM

## 2017-03-09 DIAGNOSIS — G43A0 Cyclical vomiting, not intractable: Secondary | ICD-10-CM | POA: Diagnosis not present

## 2017-03-09 DIAGNOSIS — IMO0002 Reserved for concepts with insufficient information to code with codable children: Secondary | ICD-10-CM

## 2017-03-09 NOTE — Patient Instructions (Signed)
You have been scheduled for an endoscopy and colonoscopy. Please follow the written instructions given to you at your visit today. Please pick up your prep supplies at the pharmacy. If you use inhalers (even only as needed), please bring them with you on the day of your procedure.   I appreciate the opportunity to care for you. Carl Gessner, MD, FACG 

## 2017-03-09 NOTE — Progress Notes (Signed)
[name] 31 y.o. [birth year] [id number] Referred by: [name], MD  Assessment & Plan:   Encounter Diagnoses  Name Primary?  . Non-intractable cyclical vomiting with nausea Yes  . Diarrhea, unspecified type   . Type 1 diabetes mellitus with complication, uncontrolled ([place])   . Immunosuppressed status ([place])    Given her immunosuppressed status and persistent but fortunately better symptoms, I think EGD and colonoscopy are appropriate to evaluate.The risks and benefits as well as alternatives of endoscopic procedure(s) have been discussed and reviewed. All questions answered. The patient agrees to proceed.  Her uncontrolled diabetes certainly may be contributing in the form of autonomic neuropathy which may be causing her problems.  She is waiting to see an endocrinologist.  It looks like she has seen Dr. Loanne Drilling in the past.  She thinks she is awaiting to see Dr. Jeanann Lewandowsky now.  Mycophenolate can cause diarrhea.  We will keep that in mind though she does not have a classic pattern for that it is possible.  Random colon biopsies will likely be necessary.  She certainly is at risk for gastroparesis as above under autonomic neuropathy   Learning disability as listed in her problem list.  That certainly may be having an impact as well.  I appreciate the opportunity to care for this patient. CC: [name], MD Dr. Jamal Maes    Subjective:   Chief Complaint: Nausea and vomiting diarrhea  HPI The patient is a pleasant 31 year old single black woman status post renal transplant and with type 1 diabetes mellitus who was hospitalized a few months ago with severe nausea vomiting and diarrhea symptoms.  She was seen by Dr. Michail Sermon of [facility].  He and the hospitalist decided to do a gastric emptying study but the patient could not tolerate taking the eggs she tells me.  It was never done.  CT of the abdomen and pelvis was performed, due to abdominal pain in the  setting of her nausea and vomiting, it was unrevealing.  Changes of transplanted kidney and some perinephric cystic densities were stable.  Since that time she is better but does have nausea with rare vomiting.  She is having diarrhea frequently after eating and this is despite having a regimen of dicyclomine.  She said she will awaken at night having incontinence of loose or liquid stool.  No fevers reported.  She does say she is actually better than she was, and that she gets a spell of pain and problems she will take famotidine, hydrocodone and the dicyclomine and will be improved.  She takes mycophenolate and has taken for a long time status post renal transplant and does not have a history of diarrhea with that in the past.  Lab Results  Component Value Date   HGBA1C 11.5 (H) 12/12/2016    Allergies  Allergen Reactions  . Benadryl [Diphenhydramine-Zinc Acetate] Shortness Of Breath  . Motrin [Ibuprofen] Shortness Of Breath and Itching    Per pt  . Banana Other (See Comments)    Sick on the stomach  . Diphenhydramine Hcl     REACTION: Stopped breathing in ICU  . Doxycycline     Shortness of Breath   . Iron Dextran     REACTION: vein irritation  . Shellfish Allergy Hives   Current Meds  Medication Sig  . ACCU-CHEK SOFTCLIX LANCETS lancets Use to check blood sugar 4 times per day.  . calcium carbonate (TUMS) 500 MG chewable tablet Chew 2 tablets by mouth 3 (  three) times daily as needed for indigestion. Takes on Monday, wednesdays, and fridays  . cetirizine (ZYRTEC) 10 MG tablet Take 10 mg by mouth daily.  . cyclobenzaprine (FLEXERIL) 10 MG tablet Take 1 tablet (10 mg total) by mouth 2 (two) times daily as needed for muscle spasms.  . diazepam (VALIUM) 5 MG tablet Take 1 tablet (5 mg total) by mouth every 6 (six) hours as needed for anxiety (spasms).  . dicyclomine (BENTYL) 20 MG tablet Take 1 tablet (20 mg total) by mouth 2 (two) times daily.  . famotidine (PEPCID) 40 MG tablet Take 1  tablet (40 mg total) by mouth daily.  . fluticasone (FLONASE) 50 MCG/ACT nasal spray Place 2 sprays into both nostrils daily.  Marland Kitchen gabapentin (NEURONTIN) 100 MG capsule Take 100 mg by mouth daily as needed (for feet).   Marland Kitchen HYDROcodone-acetaminophen (NORCO) 5-325 MG tablet Take 1 tablet by mouth every 4 (four) hours as needed.  . hydrOXYzine (ATARAX) 25 MG tablet Take 25 mg by mouth 2 (two) times daily as needed for itching.   . insulin aspart (NOVOLOG FLEXPEN) 100 UNIT/ML FlexPen Inject 15 Units into the skin 3 (three) times daily with meals.  . insulin degludec (TRESIBA FLEXTOUCH) 100 UNIT/ML SOPN FlexTouch Pen Inject 1.3 mLs (130 Units total) into the skin daily. And pen needles 1/day  . lisinopril-hydrochlorothiazide (PRINZIDE,ZESTORETIC) 20-25 MG tablet Take 1 tablet by mouth daily.  Marland Kitchen loperamide (IMODIUM) 2 MG capsule Take 2 mg by mouth as needed for diarrhea or loose stools. Take 2 tablets as needed for diarrhea.  . mycophenolate (MYFORTIC) 360 MG TBEC Take 720 mg by mouth 2 (two) times daily.   . ondansetron (ZOFRAN) 4 MG tablet Take 1 tablet (4 mg total) by mouth every 8 (eight) hours as needed for nausea or vomiting.  . predniSONE (DELTASONE) 5 MG tablet Take 5 mg by mouth at bedtime.   . prochlorperazine (COMPAZINE) 10 MG tablet Take 1 tablet (10 mg total) by mouth 2 (two) times daily as needed for nausea or vomiting.  . simvastatin (ZOCOR) 20 MG tablet Take 20 mg by mouth 3 (three) times a week. MWF.  Marland Kitchen tacrolimus (PROGRAF) 1 MG capsule Take 2 mg by mouth 2 (two) times daily.    Past Medical History:  Diagnosis Date  . Chronic kidney disease   . Diabetes mellitus    Pt reports diagnosis in June 2011  . Hyperlipidemia   . Hypertension   . Kidney transplant recipient   . 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 Pyelonephritis 06/23/2014  . UTI (urinary tract infection) 01/09/2015  . 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    Qualifier: Diagnosis of  By: Carlena Sax  MD, Colletta [place]     Past Surgical History:  Procedure Laterality Date  . KIDNEY TRANSPLANT  2007  . RENAL BIOPSY Bilateral 2003  . THYROID LOBECTOMY     Social History   Social History Narrative   Patient reports that she is single, she is employed at the [employer]   No alcohol tobacco or drug use   family history includes Arthritis in her mother; Diabetes in her mother; Hypertension in her mother.   Review of Systems Notable for fatigue All other review of systems negative except as per HPI  Objective:   Physical Exam @BP  106/78   Pulse 72   Ht 5\' 6"  (1.676 m)   Wt 171 lb 6.4 oz (77.7 kg)   BMI  27.66 kg/m @  General:  Well-developed, well-nourished and in no acute distress Eyes:  anicteric. ENT:   Mouth and posterior pharynx free of lesions.  Neck:   supple w/o thyromegaly or mass.  Lungs: Clear to auscultation bilaterally. Heart:  S1S2, no rubs, murmurs, gallops. Abdomen:  multiple sugical scars, soft, transplanted kidney RLQ, nontender BS + Rectal:  Lymph:  no cervical or supraclavicular adenopathy. Extremities:   no edema, cyanosis or clubbing Skin   no rash. Neuro:  A&O x 3.  Psych:  appropriate mood and  Affect.   Data Reviewed:  Hospital records primary care notes, CT scans labs etc. from 2017 and 2018 CT images from 2018 were reviewed personally

## 2017-03-10 ENCOUNTER — Encounter: Payer: Medicare Other | Admitting: Surgery

## 2017-03-10 DIAGNOSIS — I10 Essential (primary) hypertension: Secondary | ICD-10-CM | POA: Diagnosis not present

## 2017-03-10 DIAGNOSIS — Z940 Kidney transplant status: Secondary | ICD-10-CM | POA: Diagnosis not present

## 2017-03-10 DIAGNOSIS — L97512 Non-pressure chronic ulcer of other part of right foot with fat layer exposed: Secondary | ICD-10-CM | POA: Diagnosis not present

## 2017-03-10 DIAGNOSIS — E11621 Type 2 diabetes mellitus with foot ulcer: Secondary | ICD-10-CM | POA: Diagnosis not present

## 2017-03-10 DIAGNOSIS — E785 Hyperlipidemia, unspecified: Secondary | ICD-10-CM | POA: Diagnosis not present

## 2017-03-10 DIAGNOSIS — T3110 Burns involving 10-19% of body surface with 0% to 9% third degree burns: Secondary | ICD-10-CM | POA: Diagnosis not present

## 2017-03-11 NOTE — Progress Notes (Signed)
SYLIVA, MEE ([id number]) Visit Report for 03/10/2017 Arrival Information Details Patient Name: [name], [name] Date of Service: 03/10/2017 12:30 PM Medical Record Number: [id number] Patient Account Number: [id number] Date of Birth/Sex: 1985-04-21 (31 y.o. Female) Treating RN: Montey Hora Primary Care Lloyd Cullinan: [name] Other Clinician: Referring Kayan Blissett: [name] Treating Melika Reder/Extender: Frann Rider in Treatment: 3 Visit Information History Since Last Visit Added or deleted any medications: No Patient Arrived: Ambulatory Any new allergies or adverse reactions: No Arrival Time: 12:44 Had a fall or experienced change in No Accompanied By: self activities of daily living that may affect Transfer Assistance: None risk of falls: Patient Identification Verified: Yes Signs or symptoms of abuse/neglect since last visito No Secondary Verification Process Yes Hospitalized since last visit: No Completed: Has Dressing in Place as Prescribed: Yes Patient Has Alerts: Yes Pain Present Now: No Patient Alerts: DMII REFUSED FACE PHOTO Electronic Signature(s) Signed: 03/10/2017 4:29:35 PM By: Montey Hora Entered By: Montey Hora on 03/10/2017 12:45:11 [name] ([id number]) -------------------------------------------------------------------------------- Encounter Discharge Information Details Patient Name: [name] Date of Service: 03/10/2017 12:30 PM Medical Record Number: [id number] Patient Account Number: [id number] Date of Birth/Sex: 1986/01/30 (31 y.o. Female) Treating RN: Montey Hora Primary Care Chelli Yerkes: [name] Other Clinician: Referring Shantoria Ellwood: [name] Treating Amour Cutrone/Extender: Frann Rider in Treatment: 3 Encounter Discharge Information Items Discharge Pain Level: 0 Discharge Condition: Stable Ambulatory Status: Ambulatory Discharge Destination: Home Transportation: Private Auto Accompanied By:  self Schedule Follow-up Appointment: Yes Medication Reconciliation completed and No provided to Patient/Care Riaz Onorato: Provided on Clinical Summary of Care: 03/10/2017 Form Type Recipient Paper Patient DC Electronic Signature(s) Signed: 03/10/2017 1:26:51 PM By: Montey Hora Entered By: Montey Hora on 03/10/2017 13:26:51 [name] ([id number]) -------------------------------------------------------------------------------- Lower Extremity Assessment Details Patient Name: [name] Date of Service: 03/10/2017 12:30 PM Medical Record Number: [id number] Patient Account Number: [id number] Date of Birth/Sex: 1986-01-07 (31 y.o. Female) Treating RN: Montey Hora Primary Care Iverna Hammac: [name] Other Clinician: Referring Annastyn Silvey: [name] Treating Monaye Blackie/Extender: Frann Rider in Treatment: 3 Vascular Assessment Pulses: Dorsalis Pedis Palpable: [Left:Yes] [Right:Yes] Posterior Tibial Extremity colors, hair growth, and conditions: Extremity Color: [Right:Normal] Hair Growth on Extremity: [Right:No] Temperature of Extremity: [Right:Warm] Capillary Refill: [Right:< 3 seconds] Electronic Signature(s) Signed: 03/10/2017 12:55:31 PM By: Montey Hora Entered By: Montey Hora on 03/10/2017 12:55:30 [name] ([id number]) -------------------------------------------------------------------------------- Multi Wound Chart Details Patient Name: [name] Date of Service: 03/10/2017 12:30 PM Medical Record Number: [id number] Patient Account Number: [id number] Date of Birth/Sex: 10-06-1985 (31 y.o. Female) Treating RN: Montey Hora Primary Care Jake Fuhrmann: [name] Other Clinician: Referring Lodema Parma: [name] Treating Loras Grieshop/Extender: Frann Rider in Treatment: 3 Vital Signs Height(in): 37 Pulse(bpm): 26 Weight(lbs): 174 Blood Pressure(mmHg): 118/72 Body Mass Index(BMI): 28 Temperature(F): 98.5 Respiratory  Rate 18 (breaths/min): Photos: [2:No Photos] [N/A:N/A] Wound Location: [2:Right Toe Great] [N/A:N/A] Wounding Event: [2:Blister] [N/A:N/A] Primary Etiology: [2:Diabetic Wound/Ulcer of the Lower Extremity] [N/A:N/A] Comorbid History: [2:Anemia, Hypertension, Type II Diabetes, Osteoarthritis, Seizure Disorder] [N/A:N/A] Date Acquired: [2:01/24/2017] [N/A:N/A] Weeks of Treatment: [2:3] [N/A:N/A] Wound Status: [2:Open] [N/A:N/A] Pending Amputation on [2:Yes] [N/A:N/A] Presentation: Measurements L x W x D [2:0.4x0.3x0.1] [N/A:N/A] (cm) Area (cm) : [2:0.094] [N/A:N/A] Volume (cm) : [2:0.009] [N/A:N/A] % Reduction in Area: [2:87.50%] [N/A:N/A] % Reduction in Volume: [2:88.00%] [N/A:N/A] Classification: [2:Grade 2] [N/A:N/A] Exudate Amount: [2:Large] [N/A:N/A] Exudate Type: [2:Serous] [N/A:N/A] Exudate Color: [2:amber] [N/A:N/A] Wound Margin: [2:Flat and Intact] [N/A:N/A] Granulation Amount: [2:Large (67-100%)] [N/A:N/A] Granulation Quality: [2:Pink] [N/A:N/A] Necrotic Amount: [2:None Present (0%)] [  N/A:N/A] Exposed Structures: [2:Fat Layer (Subcutaneous Tissue) Exposed: Yes Fascia: No Tendon: No Muscle: No Joint: No Bone: No] [N/A:N/A] Epithelialization: [2:Medium (34-66%)] [N/A:N/A] Debridement: [2:Debridement ([id number]) 12:56] [N/A:N/A N/A] Pre-procedure Verification/Time Out Taken: Pain Control: Lidocaine 4% Topical Solution N/A N/A Tissue Debrided: Fibrin/Slough, Skin, Callus, N/A N/A Subcutaneous Level: Skin/Subcutaneous Tissue N/A N/A Debridement Area (sq cm): 0.12 N/A N/A Instrument: Curette N/A N/A Bleeding: Minimum N/A N/A Hemostasis Achieved: Pressure N/A N/A Procedural Pain: 0 N/A N/A Post Procedural Pain: 0 N/A N/A Debridement Treatment Procedure was tolerated well N/A N/A Response: Post Debridement 0.5x0.3x0.1 N/A N/A Measurements L x W x D (cm) Post Debridement Volume: 0.012 N/A N/A (cm) Periwound Skin Texture: Callus: Yes N/A N/A Excoriation:  No Induration: No Crepitus: No Rash: No Scarring: No Periwound Skin Moisture: Maceration: No N/A N/A Dry/Scaly: No Periwound Skin Color: Atrophie Blanche: No N/A N/A Cyanosis: No Ecchymosis: No Erythema: No Hemosiderin Staining: No Mottled: No Pallor: No Rubor: No Temperature: No Abnormality N/A N/A Tenderness on Palpation: Yes N/A N/A Wound Preparation: Ulcer Cleansing: N/A N/A Rinsed/Irrigated with Saline Topical Anesthetic Applied: Other: lidocaine 4% Procedures Performed: Debridement N/A N/A Treatment Notes Electronic Signature(s) Signed: 03/10/2017 1:19:25 PM By: Christin Fudge MD, FACS Entered By: Christin Fudge on 03/10/2017 13:19:24 [name] ([id number]) -------------------------------------------------------------------------------- [place] Details Patient Name: [name] Date of Service: 03/10/2017 12:30 PM Medical Record Number: [id number] Patient Account Number: [id number] Date of Birth/Sex: [birth year] (31 y.o. Female) Treating RN: Montey Hora Primary Care Dereon Corkery: [name] Other Clinician: Referring Kolby Myung: [name] Treating Colie Josten/Extender: Frann Rider in Treatment: 3 Active Inactive ` Orientation to the Wound Care Program Nursing Diagnoses: Knowledge deficit related to the wound healing center program Goals: Patient/caregiver will verbalize understanding of the [place] Program Date Initiated: 02/14/2017 Target Resolution Date: 05/21/2017 Goal Status: Active Interventions: Provide education on orientation to the wound center Notes: ` Wound/Skin Impairment Nursing Diagnoses: Impaired tissue integrity Goals: Ulcer/skin breakdown will heal within 14 weeks Date Initiated: 02/14/2017 Target Resolution Date: 05/21/2017 Goal Status: Active Interventions: Assess patient/caregiver ability to obtain necessary supplies Assess patient/caregiver ability to perform ulcer/skin care regimen  upon admission and as needed Assess ulceration(s) every visit Notes: Electronic Signature(s) Signed: 03/10/2017 4:29:35 PM By: Montey Hora Entered By: Montey Hora on 03/10/2017 12:57:15 [name] ([id number]) -------------------------------------------------------------------------------- Pain Assessment Details Patient Name: [name] Date of Service: 03/10/2017 12:30 PM Medical Record Number: [id number] Patient Account Number: [id number] Date of Birth/Sex: 07/08/1985 (31 y.o. Female) Treating RN: Montey Hora Primary Care Meekah Math: [name] Other Clinician: Referring Axtyn Woehler: [name] Treating Nahuel [name]/Extender: Frann Rider in Treatment: 3 Active Problems Location of Pain Severity and Description of Pain Patient Has Paino No Site Locations Pain Management and Medication Current Pain Management: Notes Topical or injectable lidocaine is offered to patient for acute pain when surgical debridement is performed. If needed, Patient is instructed to use over the counter pain medication for the following 24-48 hours after debridement. Wound care MDs do not prescribed pain medications. Patient has chronic pain or uncontrolled pain. Patient has been instructed to make an appointment with their Primary Care Physician for pain management. Electronic Signature(s) Signed: 03/10/2017 4:29:35 PM By: Montey Hora Entered By: Montey Hora on 03/10/2017 12:45:20 [name] ([id number]) -------------------------------------------------------------------------------- Patient/Caregiver Education Details Patient Name: [name] Date of Service: 03/10/2017 12:30 PM Medical Record Number: [id number] Patient Account Number: [id number] Date of Birth/Gender: 01/17/1986 (31 y.o. Female) Treating RN: Montey Hora Primary Care Physician: [name] Other  Clinician: Referring Physician: [name] Treating Physician/Extender: Frann Rider in Treatment: 3 Education Assessment Education Provided To: Patient Education Topics Provided Wound/Skin Impairment: Handouts: Other: wound care as ordered Methods: Demonstration, Explain/Verbal Responses: State content correctly Electronic Signature(s) Signed: 03/10/2017 4:29:35 PM By: Montey Hora Entered By: Montey Hora on 03/10/2017 13:27:08 [name] ([id number]) -------------------------------------------------------------------------------- Wound Assessment Details Patient Name: [name] Date of Service: 03/10/2017 12:30 PM Medical Record Number: [id number] Patient Account Number: [id number] Date of Birth/Sex: 1985-11-28 (31 y.o. Female) Treating RN: Montey Hora Primary Care Almer Littleton: [name] Other Clinician: Referring Tamarion Haymond: [name] Treating Sofi Bryars/Extender: Frann Rider in Treatment: 3 Wound Status Wound Number: 2 Primary Diabetic Wound/Ulcer of the Lower Extremity Etiology: Wound Location: Right Toe Great Wound Open Wounding Event: Blister Status: Date Acquired: 01/24/2017 Comorbid Anemia, Hypertension, Type II Diabetes, Weeks Of Treatment: 3 History: Osteoarthritis, Seizure Disorder Clustered Wound: No Pending Amputation On Presentation Photos Photo Uploaded By: Montey Hora on 03/10/2017 13:45:19 Wound Measurements Length: (cm) 0.4 Width: (cm) 0.3 Depth: (cm) 0.1 Area: (cm) 0.094 Volume: (cm) 0.009 % Reduction in Area: 87.5% % Reduction in Volume: 88% Epithelialization: Medium (34-66%) Tunneling: No Undermining: No Wound Description Classification: Grade 2 Wound Margin: Flat and Intact Exudate Amount: Large Exudate Type: Serous Exudate Color: amber Foul Odor After Cleansing: No Slough/Fibrino Yes Wound Bed Granulation Amount: Large (67-100%) Exposed Structure Granulation Quality: Pink Fascia Exposed: No Necrotic Amount: None Present (0%) Fat Layer (Subcutaneous Tissue) Exposed:  Yes Tendon Exposed: No Muscle Exposed: No Joint Exposed: No Bone Exposed: No Periwound Skin Texture [name], [name]. ([id number]) Texture Color No Abnormalities Noted: No No Abnormalities Noted: No Callus: Yes Atrophie Blanche: No Crepitus: No Cyanosis: No Excoriation: No Ecchymosis: No Induration: No Erythema: No Rash: No Hemosiderin Staining: No Scarring: No Mottled: No Pallor: No Moisture Rubor: No No Abnormalities Noted: No Dry / Scaly: No Temperature / Pain Maceration: No Temperature: No Abnormality Tenderness on Palpation: Yes Wound Preparation Ulcer Cleansing: Rinsed/Irrigated with Saline Topical Anesthetic Applied: Other: lidocaine 4%, Treatment Notes Wound #2 (Right Toe Great) 1. Cleansed with: Clean wound with Normal Saline 2. Anesthetic Topical Lidocaine 4% cream to wound bed prior to debridement 4. Dressing Applied: Prisma Ag 5. Secondary Dressing Applied Dry Gauze Foam Kerlix/Conform 7. Secured with Recruitment consultant) Signed: 03/10/2017 4:29:35 PM By: Montey Hora Entered By: Montey Hora on 03/10/2017 12:50:33 [name] ([id number]) -------------------------------------------------------------------------------- Vitals Details Patient Name: [name] Date of Service: 03/10/2017 12:30 PM Medical Record Number: [id number] Patient Account Number: [id number] Date of Birth/Sex: [birth year] (31 y.o. Female) Treating RN: Montey Hora Primary Care Eliakim Tendler: [name] Other Clinician: Referring Shilah Hefel: [name] Treating Tavaughn Silguero/Extender: Frann Rider in Treatment: 3 Vital Signs Time Taken: 12:45 Temperature (F): 98.5 Height (in): 66 Pulse (bpm): 75 Weight (lbs): 174 Respiratory Rate (breaths/min): 18 Body Mass Index (BMI): 28.1 Blood Pressure (mmHg): 118/72 Reference Range: 80 - 120 mg / dl Electronic Signature(s) Signed: 03/10/2017 4:29:35 PM By: Montey Hora Entered By: Montey Hora on  03/10/2017 12:47:21
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
## 2017-03-17 ENCOUNTER — Encounter (HOSPITAL_COMMUNITY)
Admission: RE | Admit: 2017-03-17 | Discharge: 2017-03-17 | Disposition: A | Discharge status: Home / Self Care | Payer: Medicare Other | Source: Ambulatory Visit | Attending: Nephrology | Admitting: Nephrology

## 2017-03-17 VITALS — BP 119/83 | HR 105 | Temp 98.6°F | Resp 16

## 2017-03-17 DIAGNOSIS — D631 Anemia in chronic kidney disease: Secondary | ICD-10-CM | POA: Diagnosis not present

## 2017-03-17 DIAGNOSIS — N183 Chronic kidney disease, stage 3 (moderate): Secondary | ICD-10-CM | POA: Insufficient documentation

## 2017-03-17 DIAGNOSIS — N182 Chronic kidney disease, stage 2 (mild): Secondary | ICD-10-CM

## 2017-03-17 LAB — POCT HEMOGLOBIN-HEMACUE: Hemoglobin: 9.8 g/dL — ABNORMAL LOW (ref 12.0–15.0)

## 2017-03-17 MED ORDER — EPOETIN ALFA 20000 UNIT/ML IJ SOLN
INTRAMUSCULAR | Status: AC
  Administered 2017-03-17: 20000 [IU] via SUBCUTANEOUS
  Filled 2017-03-17: qty 1

## 2017-03-17 MED ORDER — EPOETIN ALFA 10000 UNIT/ML IJ SOLN
INTRAMUSCULAR | Status: AC
  Administered 2017-03-17: 10000 [IU] via SUBCUTANEOUS
  Filled 2017-03-17: qty 1

## 2017-03-17 MED ORDER — EPOETIN ALFA 40000 UNIT/ML IJ SOLN: 30000.0000 [IU] | INTRAMUSCULAR | Status: DC

## 2017-03-18 ENCOUNTER — Encounter: Payer: Medicare Other | Attending: Physician Assistant | Admitting: Physician Assistant

## 2017-03-18 DIAGNOSIS — Z940 Kidney transplant status: Secondary | ICD-10-CM | POA: Diagnosis not present

## 2017-03-18 DIAGNOSIS — W292XXA Contact with other powered household machinery, initial encounter: Secondary | ICD-10-CM | POA: Insufficient documentation

## 2017-03-18 DIAGNOSIS — T3110 Burns involving 10-19% of body surface with 0% to 9% third degree burns: Secondary | ICD-10-CM | POA: Diagnosis not present

## 2017-03-18 DIAGNOSIS — I10 Essential (primary) hypertension: Secondary | ICD-10-CM | POA: Diagnosis not present

## 2017-03-18 DIAGNOSIS — L97512 Non-pressure chronic ulcer of other part of right foot with fat layer exposed: Secondary | ICD-10-CM | POA: Diagnosis not present

## 2017-03-18 DIAGNOSIS — D649 Anemia, unspecified: Secondary | ICD-10-CM | POA: Insufficient documentation

## 2017-03-18 DIAGNOSIS — Z794 Long term (current) use of insulin: Secondary | ICD-10-CM | POA: Diagnosis not present

## 2017-03-18 DIAGNOSIS — M1990 Unspecified osteoarthritis, unspecified site: Secondary | ICD-10-CM | POA: Insufficient documentation

## 2017-03-18 DIAGNOSIS — Z91013 Allergy to seafood: Secondary | ICD-10-CM | POA: Insufficient documentation

## 2017-03-18 DIAGNOSIS — G40909 Epilepsy, unspecified, not intractable, without status epilepticus: Secondary | ICD-10-CM | POA: Diagnosis not present

## 2017-03-18 DIAGNOSIS — E11621 Type 2 diabetes mellitus with foot ulcer: Secondary | ICD-10-CM | POA: Insufficient documentation

## 2017-03-18 DIAGNOSIS — Z888 Allergy status to other drugs, medicaments and biological substances status: Secondary | ICD-10-CM | POA: Diagnosis not present

## 2017-03-18 DIAGNOSIS — E785 Hyperlipidemia, unspecified: Secondary | ICD-10-CM | POA: Insufficient documentation

## 2017-03-19 NOTE — Progress Notes (Signed)
[name], [name] ([id number]) Visit Report for 03/18/2017 Arrival Information Details Patient Name: Valerie, [name] Date of Service: 03/18/2017 2:15 PM Medical Record Number: [id number] Patient Account Number: [id number] Date of Birth/Sex: 1985/09/14 (31 y.o. Female) Treating RN: Montey Hora Primary Care [place] Mokry: [name] Other Clinician: Referring Ravyn Nikkel: [name] Treating Rushil Kimbrell/Extender: Melburn Hake, HOYT Weeks in Treatment: 4 Visit Information History Since Last Visit Added or deleted any medications: No Patient Arrived: Ambulatory Any new allergies or adverse reactions: No Arrival Time: 14:23 Had a fall or experienced change in No Accompanied By: self activities of daily living that may affect Transfer Assistance: None risk of falls: Patient Identification Verified: Yes Signs or symptoms of abuse/neglect since last visito No Secondary Verification Process Yes Hospitalized since last visit: No Completed: Has Dressing in Place as Prescribed: Yes Patient Has Alerts: Yes Pain Present Now: No Patient Alerts: DMII REFUSED FACE PHOTO Electronic Signature(s) Signed: 03/18/2017 4:44:00 PM By: Montey Hora Entered By: Montey Hora on 03/18/2017 14:25:41 [name] ([id number]) -------------------------------------------------------------------------------- Clinic Level of Care Assessment Details Patient Name: [name] Date of Service: 03/18/2017 2:15 PM Medical Record Number: [id number] Patient Account Number: [id number] Date of Birth/Sex: [birth year] (31 y.o. Female) Treating RN: Montey Hora Primary Care Deundra Bard: [name] Other Clinician: Referring Abrianna Sidman: [name] Treating Avanell Banwart/Extender: Melburn Hake, HOYT Weeks in Treatment: 4 Clinic Level of Care Assessment Items TOOL 4 Quantity Score []  - Use when only an EandM is performed on FOLLOW-UP visit 0 ASSESSMENTS - Nursing Assessment / Reassessment X - Reassessment of Co-morbidities  (includes updates in patient status) 1 10 X- 1 5 Reassessment of Adherence to Treatment Plan ASSESSMENTS - Wound and Skin Assessment / Reassessment X - Simple Wound Assessment / Reassessment - one wound 1 5 []  - 0 Complex Wound Assessment / Reassessment - multiple wounds []  - 0 Dermatologic / Skin Assessment (not related to wound area) ASSESSMENTS - Focused Assessment []  - Circumferential Edema Measurements - multi extremities 0 []  - 0 Nutritional Assessment / Counseling / Intervention X- 1 5 Lower Extremity Assessment (monofilament, tuning fork, pulses) []  - 0 Peripheral Arterial Disease Assessment (using hand held doppler) ASSESSMENTS - Ostomy and/or Continence Assessment and Care []  - Incontinence Assessment and Management 0 []  - 0 Ostomy Care Assessment and Management (repouching, etc.) PROCESS - Coordination of Care X - Simple Patient / Family Education for ongoing care 1 15 []  - 0 Complex (extensive) Patient / Family Education for ongoing care []  - 0 Staff obtains Programmer, systems, Records, Test Results / Process Orders []  - 0 Staff telephones HHA, Nursing Homes / Clarify orders / etc []  - 0 Routine Transfer to another Facility (non-emergent condition) []  - 0 Routine Hospital Admission (non-emergent condition) []  - 0 New Admissions / Biomedical engineer / Ordering NPWT, Apligraf, etc. []  - 0 Emergency Hospital Admission (emergent condition) X- 1 10 Simple Discharge Coordination BRAE, [name] ([id number]) []  - 0 Complex (extensive) Discharge Coordination PROCESS - Special Needs []  - Pediatric / Minor Patient Management 0 []  - 0 Isolation Patient Management []  - 0 Hearing / Language / Visual special needs []  - 0 Assessment of Community assistance (transportation, D/C planning, etc.) []  - 0 Additional assistance / Altered mentation []  - 0 Support Surface(s) Assessment (bed, cushion, seat, etc.) INTERVENTIONS - Wound Cleansing / Measurement X - Simple Wound  Cleansing - one wound 1 5 []  - 0 Complex Wound Cleansing - multiple wounds X- 1 5 Wound Imaging (photographs - any number of wounds) []  - 0  Wound Tracing (instead of photographs) X- 1 5 Simple Wound Measurement - one wound []  - 0 Complex Wound Measurement - multiple wounds INTERVENTIONS - Wound Dressings []  - Small Wound Dressing one or multiple wounds 0 []  - 0 Medium Wound Dressing one or multiple wounds []  - 0 Large Wound Dressing one or multiple wounds []  - 0 Application of Medications - topical []  - 0 Application of Medications - injection INTERVENTIONS - Miscellaneous []  - External ear exam 0 []  - 0 Specimen Collection (cultures, biopsies, blood, body fluids, etc.) []  - 0 Specimen(s) / Culture(s) sent or taken to Lab for analysis []  - 0 Patient Transfer (multiple staff / Civil Service fast streamer / Similar devices) []  - 0 Simple Staple / Suture removal (25 or less) []  - 0 Complex Staple / Suture removal (26 or more) []  - 0 Hypo / Hyperglycemic Management (close monitor of Blood Glucose) []  - 0 Ankle / Brachial Index (ABI) - do not check if billed separately X- 1 5 Vital Signs [name], [name]. ([id number]) Has the patient been seen at the hospital within the last three years: Yes Total Score: 70 Level Of Care: New/Established - Level 2 Electronic Signature(s) Signed: 03/18/2017 4:44:00 PM By: Montey Hora Entered By: Montey Hora on 03/18/2017 15:08:11 [name] ([id number]) -------------------------------------------------------------------------------- Encounter Discharge Information Details Patient Name: [name] Date of Service: 03/18/2017 2:15 PM Medical Record Number: [id number] Patient Account Number: [id number] Date of Birth/Sex: 06-29-1985 (31 y.o. Female) Treating RN: Montey Hora Primary Care Zaul Hubers: [name] Other Clinician: Referring Orman Matsumura: [name] Treating Duffy Dantonio/Extender: Melburn Hake, HOYT Weeks in Treatment: 4 Encounter  Discharge Information Items Discharge Pain Level: 0 Discharge Condition: Stable Ambulatory Status: Ambulatory Discharge Destination: Home Transportation: Private Auto Accompanied By: self Schedule Follow-up Appointment: No Medication Reconciliation completed and No provided to Patient/Care Quinne Pires: Provided on Clinical Summary of Care: 03/18/2017 Form Type Recipient Paper Patient DC Electronic Signature(s) Signed: 03/18/2017 3:08:31 PM By: Montey Hora Entered By: Montey Hora on 03/18/2017 15:08:31 [name] ([id number]) -------------------------------------------------------------------------------- Lower Extremity Assessment Details Patient Name: [name] Date of Service: 03/18/2017 2:15 PM Medical Record Number: [id number] Patient Account Number: [id number] Date of Birth/Sex: September 14, 1985 (31 y.o. Female) Treating RN: Montey Hora Primary Care Solina Heron: [name] Other Clinician: Referring Morgana Rowley: [name] Treating Iam Lipson/Extender: Melburn Hake, HOYT Weeks in Treatment: 4 Vascular Assessment Pulses: Dorsalis Pedis Palpable: [Right:Yes] Posterior Tibial Extremity colors, hair growth, and conditions: Extremity Color: [Right:Hyperpigmented] Hair Growth on Extremity: [Right:No] Temperature of Extremity: [Right:Warm] Capillary Refill: [Right:< 3 seconds] Electronic Signature(s) Signed: 03/18/2017 4:44:00 PM By: Montey Hora Entered By: Montey Hora on 03/18/2017 14:29:36 [name] ([id number]) -------------------------------------------------------------------------------- Thor Details Patient Name: [name] Date of Service: 03/18/2017 2:15 PM Medical Record Number: [id number] Patient Account Number: [id number] Date of Birth/Sex: 04-05-1985 (31 y.o. Female) Treating RN: Montey Hora Primary Care Ishmeal Rorie: [name] Other Clinician: Referring Alonnie Bieker: [name] Treating Marko Skalski/Extender: Melburn Hake, HOYT Weeks in Treatment: 4 Active Inactive Electronic Signature(s) Signed: 03/18/2017 4:44:00 PM By: Montey Hora Entered By: Montey Hora on 03/18/2017 14:38:03 [name] ([id number]) -------------------------------------------------------------------------------- Pain Assessment Details Patient Name: [name] Date of Service: 03/18/2017 2:15 PM Medical Record Number: [id number] Patient Account Number: [id number] Date of Birth/Sex: [birth year] (31 y.o. Female) Treating RN: Montey Hora Primary Care Melane Windholz: [name] Other Clinician: Referring Ndeye Tenorio: [name] Treating Shruti Arrey/Extender: STONE III, HOYT Weeks in Treatment: 4 Active Problems Location of Pain Severity and Description of Pain Patient Has Paino No Site  Locations Pain Management and Medication Current Pain Management: Notes Topical or injectable lidocaine is offered to patient for acute pain when surgical debridement is performed. If needed, Patient is instructed to use over the counter pain medication for the following 24-48 hours after debridement. Wound care MDs do not prescribed pain medications. Patient has chronic pain or uncontrolled pain. Patient has been instructed to make an appointment with their Primary Care Physician for pain management. Electronic Signature(s) Signed: 03/18/2017 4:44:00 PM By: Montey Hora Entered By: Montey Hora on 03/18/2017 14:25:49 [name] ([id number]) -------------------------------------------------------------------------------- Patient/Caregiver Education Details Patient Name: [name] Date of Service: 03/18/2017 2:15 PM Medical Record Number: [id number] Patient Account Number: [id number] Date of Birth/Gender: 21-Oct-1985 (31 y.o. Female) Treating RN: Montey Hora Primary Care Physician: [name] Other Clinician: Referring Physician: [name] Treating Physician/Extender: Sharalyn Ink in Treatment:  4 Education Assessment Education Provided To: Patient Education Topics Provided Basic Hygiene: Handouts: Other: care of newly healed ulcer site Methods: Explain/Verbal Responses: State content correctly Electronic Signature(s) Signed: 03/18/2017 4:44:00 PM By: Montey Hora Entered By: Montey Hora on 03/18/2017 15:09:02 [name] ([id number]) -------------------------------------------------------------------------------- Wound Assessment Details Patient Name: [name] Date of Service: 03/18/2017 2:15 PM Medical Record Number: [id number] Patient Account Number: [id number] Date of Birth/Sex: 01-13-1986 (31 y.o. Female) Treating RN: Montey Hora Primary Care Nahjae Hoeg: [name] Other Clinician: Referring Sharlena Kristensen: [name] Treating Jalynn Betzold/Extender: STONE III, HOYT Weeks in Treatment: 4 Wound Status Wound Number: 2 Primary Diabetic Wound/Ulcer of the Lower Extremity Etiology: Wound Location: Right Toe Great Wound Healed - Epithelialized Wounding Event: Blister Status: Date Acquired: 01/24/2017 Comorbid Anemia, Hypertension, Type II Diabetes, Weeks Of Treatment: 4 History: Osteoarthritis, Seizure Disorder Clustered Wound: No Pending Amputation On Presentation Photos Photo Uploaded By: Montey Hora on 03/18/2017 15:05:55 Wound Measurements Length: (cm) 0 % Redu Width: (cm) 0 % Redu Depth: (cm) 0 Epithe Area: (cm) 0 Tunne Volume: (cm) 0 Under ction in Area: 100% ction in Volume: 100% lialization: Medium (34-66%) ling: No mining: No Wound Description Classification: Grade 2 Wound Margin: Flat and Intact Exudate Amount: Large Exudate Type: Serous Exudate Color: amber Foul Odor After Cleansing: No Slough/Fibrino Yes Wound Bed Granulation Amount: Large (67-100%) Exposed Structure Granulation Quality: Pink Fascia Exposed: No Necrotic Amount: None Present (0%) Fat Layer (Subcutaneous Tissue) Exposed: Yes Tendon Exposed: No Muscle  Exposed: No Joint Exposed: No Bone Exposed: No Periwound Skin Texture [name], [name]. ([id number]) Texture Color No Abnormalities Noted: No No Abnormalities Noted: No Callus: Yes Atrophie Blanche: No Crepitus: No Cyanosis: No Excoriation: No Ecchymosis: No Induration: No Erythema: No Rash: No Hemosiderin Staining: No Scarring: No Mottled: No Pallor: No Moisture Rubor: No No Abnormalities Noted: No Dry / Scaly: No Temperature / Pain Maceration: No Temperature: No Abnormality Tenderness on Palpation: Yes Wound Preparation Ulcer Cleansing: Rinsed/Irrigated with Saline Topical Anesthetic Applied: Other: lidocaine 4%, Electronic Signature(s) Signed: 03/18/2017 4:44:00 PM By: Montey Hora Entered By: Montey Hora on 03/18/2017 14:37:52 [name] ([id number]) -------------------------------------------------------------------------------- Vitals Details Patient Name: [name] Date of Service: 03/18/2017 2:15 PM Medical Record Number: [id number] Patient Account Number: [id number] Date of Birth/Sex: 1986/02/28 (31 y.o. Female) Treating RN: Montey Hora Primary Care Kassondra Geil: [name] Other Clinician: Referring Lucill Mauck: [name] Treating Sascha Palma/Extender: STONE III, HOYT Weeks in Treatment: 4 Vital Signs Time Taken: 14:29 Pulse (bpm): 97 Height (in): 66 Respiratory Rate (breaths/min): 16 Weight (lbs): 174 Blood Pressure (mmHg): 136/91 Body Mass Index (BMI): 28.1 Reference Range: 80 - 120 mg / dl  Electronic Signature(s) Signed: 03/18/2017 4:44:00 PM By: Montey Hora Entered By: Montey Hora on 03/18/2017 14:34:53
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
## 2017-03-29 DIAGNOSIS — E1065 Type 1 diabetes mellitus with hyperglycemia: Secondary | ICD-10-CM | POA: Diagnosis not present

## 2017-03-31 ENCOUNTER — Encounter (HOSPITAL_COMMUNITY)
Admission: RE | Admit: 2017-03-31 | Discharge: 2017-03-31 | Disposition: A | Discharge status: Home / Self Care | Payer: Medicare Other | Source: Ambulatory Visit | Attending: Nephrology | Admitting: Nephrology

## 2017-03-31 ENCOUNTER — Encounter (HOSPITAL_COMMUNITY): Payer: Medicare Other

## 2017-03-31 VITALS — BP 129/96 | HR 98 | Resp 16

## 2017-03-31 DIAGNOSIS — N182 Chronic kidney disease, stage 2 (mild): Secondary | ICD-10-CM

## 2017-03-31 DIAGNOSIS — D631 Anemia in chronic kidney disease: Secondary | ICD-10-CM | POA: Diagnosis not present

## 2017-03-31 DIAGNOSIS — N183 Chronic kidney disease, stage 3 (moderate): Secondary | ICD-10-CM | POA: Diagnosis not present

## 2017-03-31 LAB — POCT HEMOGLOBIN-HEMACUE: Hemoglobin: 10.6 g/dL — ABNORMAL LOW (ref 12.0–15.0)

## 2017-03-31 MED ORDER — EPOETIN ALFA 10000 UNIT/ML IJ SOLN
INTRAMUSCULAR | Status: AC
  Administered 2017-03-31: 10000 [IU] via SUBCUTANEOUS
  Filled 2017-03-31: qty 1

## 2017-03-31 MED ORDER — EPOETIN ALFA 20000 UNIT/ML IJ SOLN
INTRAMUSCULAR | Status: AC
  Administered 2017-03-31: 20000 [IU] via SUBCUTANEOUS
  Filled 2017-03-31: qty 1

## 2017-03-31 MED ORDER — EPOETIN ALFA 40000 UNIT/ML IJ SOLN
30000.0000 [IU] | INTRAMUSCULAR | Status: DC
Start: 2017-03-31 — End: 2017-04-01

## 2017-04-08 DIAGNOSIS — E119 Type 2 diabetes mellitus without complications: Secondary | ICD-10-CM | POA: Diagnosis not present

## 2017-04-08 DIAGNOSIS — Z794 Long term (current) use of insulin: Secondary | ICD-10-CM | POA: Diagnosis not present

## 2017-04-14 ENCOUNTER — Encounter (HOSPITAL_COMMUNITY)
Admission: RE | Admit: 2017-04-14 | Discharge: 2017-04-14 | Disposition: A | Discharge status: Home / Self Care | Payer: Medicare Other | Source: Ambulatory Visit | Attending: Nephrology | Admitting: Nephrology

## 2017-04-14 ENCOUNTER — Telehealth: Payer: Self-pay | Admitting: Internal Medicine

## 2017-04-14 VITALS — BP 126/81 | HR 106 | Resp 18

## 2017-04-14 DIAGNOSIS — N183 Chronic kidney disease, stage 3 (moderate): Secondary | ICD-10-CM | POA: Diagnosis not present

## 2017-04-14 DIAGNOSIS — D631 Anemia in chronic kidney disease: Secondary | ICD-10-CM | POA: Diagnosis not present

## 2017-04-14 DIAGNOSIS — N182 Chronic kidney disease, stage 2 (mild): Secondary | ICD-10-CM

## 2017-04-14 LAB — POCT HEMOGLOBIN-HEMACUE: Hemoglobin: 9.3 g/dL — ABNORMAL LOW (ref 12.0–15.0)

## 2017-04-14 MED ORDER — EPOETIN ALFA 10000 UNIT/ML IJ SOLN
INTRAMUSCULAR | Status: AC
  Administered 2017-04-14: 10000 [IU]
  Filled 2017-04-14: qty 1

## 2017-04-14 MED ORDER — EPOETIN ALFA 40000 UNIT/ML IJ SOLN: 30000.0000 [IU] | INTRAMUSCULAR | Status: DC

## 2017-04-14 MED ORDER — EPOETIN ALFA 20000 UNIT/ML IJ SOLN
INTRAMUSCULAR | Status: AC
  Administered 2017-04-14: 20000 [IU]
  Filled 2017-04-14: qty 1

## 2017-04-14 NOTE — Telephone Encounter (Signed)
OK No charge 

## 2017-04-15 ENCOUNTER — Encounter: Payer: Medicare Other | Admitting: Internal Medicine

## 2017-04-18 DIAGNOSIS — Z940 Kidney transplant status: Secondary | ICD-10-CM | POA: Diagnosis not present

## 2017-04-18 DIAGNOSIS — Z9889 Other specified postprocedural states: Secondary | ICD-10-CM | POA: Diagnosis not present

## 2017-04-18 DIAGNOSIS — I10 Essential (primary) hypertension: Secondary | ICD-10-CM | POA: Diagnosis not present

## 2017-04-18 DIAGNOSIS — E785 Hyperlipidemia, unspecified: Secondary | ICD-10-CM | POA: Diagnosis not present

## 2017-04-18 DIAGNOSIS — E139 Other specified diabetes mellitus without complications: Secondary | ICD-10-CM | POA: Diagnosis not present

## 2017-04-18 DIAGNOSIS — D631 Anemia in chronic kidney disease: Secondary | ICD-10-CM | POA: Diagnosis not present

## 2017-04-26 DIAGNOSIS — E1065 Type 1 diabetes mellitus with hyperglycemia: Secondary | ICD-10-CM | POA: Diagnosis not present

## 2017-04-27 ENCOUNTER — Other Ambulatory Visit (HOSPITAL_COMMUNITY): Payer: Self-pay | Admitting: *Deleted

## 2017-04-28 ENCOUNTER — Encounter (HOSPITAL_COMMUNITY)
Admission: RE | Admit: 2017-04-28 | Discharge: 2017-04-28 | Disposition: A | Discharge status: Home / Self Care | Payer: Medicare Other | Source: Ambulatory Visit | Attending: Nephrology | Admitting: Nephrology

## 2017-04-28 VITALS — BP 104/78 | HR 113 | Temp 98.2°F | Resp 18

## 2017-04-28 DIAGNOSIS — D631 Anemia in chronic kidney disease: Secondary | ICD-10-CM | POA: Insufficient documentation

## 2017-04-28 DIAGNOSIS — N183 Chronic kidney disease, stage 3 (moderate): Secondary | ICD-10-CM | POA: Insufficient documentation

## 2017-04-28 DIAGNOSIS — N182 Chronic kidney disease, stage 2 (mild): Secondary | ICD-10-CM

## 2017-04-28 LAB — POCT HEMOGLOBIN-HEMACUE: HEMOGLOBIN: 10.2 g/dL — AB (ref 12.0–15.0)

## 2017-04-28 MED ORDER — EPOETIN ALFA 20000 UNIT/ML IJ SOLN
INTRAMUSCULAR | Status: AC
  Administered 2017-04-28: 20000 [IU] via SUBCUTANEOUS
  Filled 2017-04-28: qty 1

## 2017-04-28 MED ORDER — EPOETIN ALFA 10000 UNIT/ML IJ SOLN
INTRAMUSCULAR | Status: AC
  Administered 2017-04-28: 10000 [IU] via SUBCUTANEOUS
  Filled 2017-04-28: qty 1

## 2017-04-28 MED ORDER — EPOETIN ALFA 40000 UNIT/ML IJ SOLN: 30000.0000 [IU] | INTRAMUSCULAR | Status: DC

## 2017-05-10 DIAGNOSIS — Z7952 Long term (current) use of systemic steroids: Secondary | ICD-10-CM | POA: Diagnosis not present

## 2017-05-10 DIAGNOSIS — E1365 Other specified diabetes mellitus with hyperglycemia: Secondary | ICD-10-CM | POA: Diagnosis not present

## 2017-05-10 DIAGNOSIS — T380X5A Adverse effect of glucocorticoids and synthetic analogues, initial encounter: Secondary | ICD-10-CM | POA: Diagnosis not present

## 2017-05-10 DIAGNOSIS — Z940 Kidney transplant status: Secondary | ICD-10-CM | POA: Diagnosis not present

## 2017-05-10 DIAGNOSIS — N186 End stage renal disease: Secondary | ICD-10-CM | POA: Diagnosis not present

## 2017-05-10 DIAGNOSIS — Z794 Long term (current) use of insulin: Secondary | ICD-10-CM | POA: Diagnosis not present

## 2017-05-12 ENCOUNTER — Encounter (HOSPITAL_COMMUNITY)
Admission: RE | Admit: 2017-05-12 | Discharge: 2017-05-12 | Disposition: A | Discharge status: Home / Self Care | Payer: Medicare Other | Source: Ambulatory Visit | Attending: Nephrology | Admitting: Nephrology

## 2017-05-12 VITALS — BP 124/89 | HR 97 | Resp 18

## 2017-05-12 DIAGNOSIS — N182 Chronic kidney disease, stage 2 (mild): Secondary | ICD-10-CM

## 2017-05-12 DIAGNOSIS — N183 Chronic kidney disease, stage 3 (moderate): Secondary | ICD-10-CM | POA: Diagnosis not present

## 2017-05-12 DIAGNOSIS — D631 Anemia in chronic kidney disease: Secondary | ICD-10-CM | POA: Diagnosis not present

## 2017-05-12 LAB — POCT HEMOGLOBIN-HEMACUE: HEMOGLOBIN: 11.3 g/dL — AB (ref 12.0–15.0)

## 2017-05-12 MED ORDER — EPOETIN ALFA 20000 UNIT/ML IJ SOLN
INTRAMUSCULAR | Status: AC
  Administered 2017-05-12: 20000 [IU]
  Filled 2017-05-12: qty 1

## 2017-05-12 MED ORDER — EPOETIN ALFA 10000 UNIT/ML IJ SOLN
INTRAMUSCULAR | Status: AC
  Administered 2017-05-12: 10000 [IU]
  Filled 2017-05-12: qty 1

## 2017-05-12 MED ORDER — EPOETIN ALFA 40000 UNIT/ML IJ SOLN: 30000.0000 [IU] | INTRAMUSCULAR | Status: DC

## 2017-05-25 ENCOUNTER — Encounter (HOSPITAL_COMMUNITY)
Admission: RE | Admit: 2017-05-25 | Discharge: 2017-05-25 | Disposition: A | Discharge status: Home / Self Care | Payer: Medicare Other | Source: Ambulatory Visit | Attending: Nephrology | Admitting: Nephrology

## 2017-05-25 VITALS — BP 98/68 | HR 109 | Temp 98.4°F | Resp 16

## 2017-05-25 DIAGNOSIS — N183 Chronic kidney disease, stage 3 (moderate): Secondary | ICD-10-CM | POA: Insufficient documentation

## 2017-05-25 DIAGNOSIS — D631 Anemia in chronic kidney disease: Secondary | ICD-10-CM | POA: Diagnosis not present

## 2017-05-25 DIAGNOSIS — N182 Chronic kidney disease, stage 2 (mild): Secondary | ICD-10-CM

## 2017-05-25 LAB — POCT HEMOGLOBIN-HEMACUE: HEMOGLOBIN: 10.3 g/dL — AB (ref 12.0–15.0)

## 2017-05-25 MED ORDER — EPOETIN ALFA 40000 UNIT/ML IJ SOLN: 30000.0000 [IU] | INTRAMUSCULAR | Status: DC

## 2017-05-25 MED ORDER — EPOETIN ALFA 20000 UNIT/ML IJ SOLN
INTRAMUSCULAR | Status: AC
  Administered 2017-05-25: 20000 [IU] via SUBCUTANEOUS
  Filled 2017-05-25: qty 1

## 2017-05-25 MED ORDER — EPOETIN ALFA 10000 UNIT/ML IJ SOLN
INTRAMUSCULAR | Status: AC
  Administered 2017-05-25: 10000 [IU] via SUBCUTANEOUS
  Filled 2017-05-25: qty 1

## 2017-05-26 ENCOUNTER — Ambulatory Visit
Admission: RE | Admit: 2017-05-26 | Discharge: 2017-05-26 | Disposition: A | Discharge status: Home / Self Care | Payer: Medicare Other | Source: Ambulatory Visit | Attending: Physician Assistant | Admitting: Physician Assistant

## 2017-05-26 ENCOUNTER — Other Ambulatory Visit: Payer: Self-pay | Admitting: Physician Assistant

## 2017-05-26 ENCOUNTER — Encounter: Payer: Medicare Other | Attending: Physician Assistant | Admitting: Physician Assistant

## 2017-05-26 ENCOUNTER — Encounter (HOSPITAL_COMMUNITY): Payer: Medicare Other

## 2017-05-26 DIAGNOSIS — L97512 Non-pressure chronic ulcer of other part of right foot with fat layer exposed: Secondary | ICD-10-CM | POA: Insufficient documentation

## 2017-05-26 DIAGNOSIS — Z940 Kidney transplant status: Secondary | ICD-10-CM | POA: Insufficient documentation

## 2017-05-26 DIAGNOSIS — S81801A Unspecified open wound, right lower leg, initial encounter: Secondary | ICD-10-CM

## 2017-05-26 DIAGNOSIS — E1140 Type 2 diabetes mellitus with diabetic neuropathy, unspecified: Secondary | ICD-10-CM | POA: Insufficient documentation

## 2017-05-26 DIAGNOSIS — E11621 Type 2 diabetes mellitus with foot ulcer: Secondary | ICD-10-CM | POA: Insufficient documentation

## 2017-05-26 DIAGNOSIS — Z794 Long term (current) use of insulin: Secondary | ICD-10-CM | POA: Insufficient documentation

## 2017-05-26 DIAGNOSIS — S91102A Unspecified open wound of left great toe without damage to nail, initial encounter: Secondary | ICD-10-CM | POA: Diagnosis not present

## 2017-05-26 DIAGNOSIS — L97519 Non-pressure chronic ulcer of other part of right foot with unspecified severity: Secondary | ICD-10-CM | POA: Diagnosis not present

## 2017-05-26 DIAGNOSIS — I10 Essential (primary) hypertension: Secondary | ICD-10-CM | POA: Insufficient documentation

## 2017-05-27 NOTE — Progress Notes (Signed)
[name], [name] ([id number]) Visit Report for 05/26/2017 Abuse/Suicide Risk Screen Details Patient Name: [name], [name] Date of Service: 05/26/2017 12:30 PM Medical Record Number: [id number] Patient Account Number: [id number] Date of Birth/Sex: 01-11-1986 (31 y.o. Female) Treating RN: Montey Hora Primary Care Barnell Shieh: [name] Other Clinician: Referring Gabrian Hoque: Referral, Self Treating Keshonda Monsour/Extender: STONE III, HOYT Weeks in Treatment: 0 Abuse/Suicide Risk Screen Items Answer ABUSE/SUICIDE RISK SCREEN: Has anyone close to you tried to hurt or harm you recentlyo No Do you feel uncomfortable with anyone in your familyo No Has anyone forced you do things that you didnot want to doo No Do you have any thoughts of harming yourselfo No Patient displays signs or symptoms of abuse and/or neglect. No Electronic Signature(s) Signed: 05/26/2017 3:33:05 PM By: Montey Hora Entered By: Montey Hora on 05/26/2017 11:29:03 [name] ([id number]) -------------------------------------------------------------------------------- Activities of Daily Living Details Patient Name: [name] Date of Service: 05/26/2017 12:30 PM Medical Record Number: [id number] Patient Account Number: [id number] Date of Birth/Sex: May 08, 1985 (31 y.o. Female) Treating RN: Montey Hora Primary Care Tehran Rabenold: [name] Other Clinician: Referring Norva Bowe: Referral, Self Treating Aries Kasa/Extender: STONE III, HOYT Weeks in Treatment: 0 Activities of Daily Living Items Answer Activities of Daily Living (Please select one for each item) Drive Automobile Completely Able Take Medications Completely Able Use Telephone Completely Able Care for Appearance Completely Able Use Toilet Completely Able Bath / Shower Completely Able Dress Self Completely Able Feed Self Completely Able Walk Completely Able Get In / Out Bed Completely Able Housework Completely Able Prepare Meals Completely  [place] for Self Completely Able Electronic Signature(s) Signed: 05/26/2017 3:33:05 PM By: Montey Hora Entered By: Montey Hora on 05/26/2017 11:29:22 [name] ([id number]) -------------------------------------------------------------------------------- Education Assessment Details Patient Name: [name] Date of Service: 05/26/2017 12:30 PM Medical Record Number: [id number] Patient Account Number: [id number] Date of Birth/Sex: [birth year] (31 y.o. Female) Treating RN: Montey Hora Primary Care Amita Atayde: [name] Other Clinician: Referring Rejoice Heatwole: Referral, Self Treating Emmarie Sannes/Extender: Melburn Hake, HOYT Weeks in Treatment: 0 Primary Learner Assessed: Patient Learning Preferences/Education Level/Primary Language Learning Preference: Explanation, Demonstration Highest Education Level: College or Above Preferred Language: English Cognitive Barrier Assessment/Beliefs Language Barrier: No Translator Needed: No Memory Deficit: No Emotional Barrier: No Cultural/Religious Beliefs Affecting Medical Care: No Physical Barrier Assessment Impaired Vision: No Impaired Hearing: No Decreased Hand dexterity: No Knowledge/Comprehension Assessment Knowledge Level: Medium Comprehension Level: Medium Ability to understand written Medium instructions: Ability to understand verbal Medium instructions: Motivation Assessment Anxiety Level: Calm Cooperation: Cooperative Education Importance: Acknowledges Need Interest in Health Problems: Asks Questions Perception: Coherent Willingness to Engage in Self- Medium Management Activities: Readiness to Engage in Self- Medium Management Activities: Electronic Signature(s) Signed: 05/26/2017 3:33:05 PM By: Montey Hora Entered By: Montey Hora on 05/26/2017 11:30:08 [name] ([id number]) -------------------------------------------------------------------------------- Fall Risk  Assessment Details Patient Name: [name] Date of Service: 05/26/2017 12:30 PM Medical Record Number: [id number] Patient Account Number: [id number] Date of Birth/Sex: 02-14-1986 (31 y.o. Female) Treating RN: Montey Hora Primary Care Shadeed Colberg: [name] Other Clinician: Referring Lenise Jr: Referral, Self Treating Donyell Ding/Extender: Melburn Hake, HOYT Weeks in Treatment: 0 Fall Risk Assessment Items Have you had 2 or more falls in the last 12 monthso 0 No Have you had any fall that resulted in injury in the last 12 monthso 0 No FALL RISK ASSESSMENT: History of falling - immediate or within 3 months 0 No Secondary diagnosis 0 No Ambulatory aid None/bed rest/wheelchair/nurse 0 Yes Crutches/cane/walker 0 No Furniture  0 No IV Access/Saline Lock 0 No Gait/Training Normal/bed rest/immobile 0 Yes Weak 0 No Impaired 0 No Mental Status Oriented to own ability 0 Yes Electronic Signature(s) Signed: 05/26/2017 3:33:05 PM By: Montey Hora Entered By: Montey Hora on 05/26/2017 11:30:38 [name] ([id number]) -------------------------------------------------------------------------------- Foot Assessment Details Patient Name: [name] Date of Service: 05/26/2017 12:30 PM Medical Record Number: [id number] Patient Account Number: [id number] Date of Birth/Sex: 01-18-1986 (31 y.o. Female) Treating RN: Roger Shelter Primary Care Olar Santini: [name] Other Clinician: Referring Zebulin Siegel: Referral, Self Treating Ayana Imhof/Extender: STONE III, HOYT Weeks in Treatment: 0 Foot Assessment Items Site Locations + = Sensation present, - = Sensation absent, C = Callus, U = Ulcer R = Redness, W = Warmth, M = Maceration, PU = Pre-ulcerative lesion F = Fissure, S = Swelling, D = Dryness Assessment Right: Left: Other Deformity: No No Prior Foot Ulcer: No No Prior Amputation: No No Charcot Joint: No No Ambulatory Status: Ambulatory Without Help Gait: Steady Electronic  Signature(s) Signed: 05/26/2017 2:35:19 PM By: Roger Shelter Entered By: Roger Shelter on 05/26/2017 12:50:14 [name] ([id number]) -------------------------------------------------------------------------------- Nutrition Risk Assessment Details Patient Name: [name] Date of Service: 05/26/2017 12:30 PM Medical Record Number: [id number] Patient Account Number: [id number] Date of Birth/Sex: June 07, 1985 (31 y.o. Female) Treating RN: Montey Hora Primary Care Dearion Huot: [name] Other Clinician: Referring Young Brim: Referral, Self Treating Shenica Holzheimer/Extender: STONE III, HOYT Weeks in Treatment: 0 Height (in): 66 Weight (lbs): 174 Body Mass Index (BMI): 28.1 Nutrition Risk Assessment Items NUTRITION RISK SCREEN: I have an illness or condition that made me change the kind and/or amount of 0 No food I eat I eat fewer than two meals per day 0 No I eat few fruits and vegetables, or milk products 0 No I have three or more drinks of beer, liquor or wine almost every day 0 No I have tooth or mouth problems that make it hard for me to eat 0 No I don't always have enough money to buy the food I need 0 No I eat alone most of the time 0 No I take three or more different prescribed or over-the-counter drugs a day 1 Yes Without wanting to, I have lost or gained 10 pounds in the last six months 0 No I am not always physically able to shop, [place] and/or feed myself 0 No Nutrition Protocols Good Risk Protocol 0 No interventions needed Moderate Risk Protocol Electronic Signature(s) Signed: 05/26/2017 3:33:05 PM By: Montey Hora Entered By: Montey Hora on 05/26/2017 11:30:45

## 2017-05-29 NOTE — Progress Notes (Signed)
[name], [name] ([id number]) Visit Report for 05/26/2017 Allergy List Details Patient Name: [name] Date of Service: 05/26/2017 12:30 PM Medical Record Number: [id number] Patient Account Number: [id number] Date of Birth/Sex: 1985/09/15 (31 y.o. Female) Treating RN: Montey Hora Primary Care Guyla Bless: [name] Other Clinician: Referring Zenora Karpel: Referral, Self Treating Dawanda Mapel/Extender: STONE III, HOYT Weeks in Treatment: 0 Allergies Active Allergies Shellfish Containing Products Benadryl Reaction: anaphalaxis Severity: Severe banana nitrofurantoin Infed Allergy Notes Electronic Signature(s) Signed: 05/26/2017 3:33:05 PM By: Montey Hora Entered By: Montey Hora on 05/26/2017 11:28:44 [name] ([id number]) -------------------------------------------------------------------------------- Arrival Information Details Patient Name: [name] Date of Service: 05/26/2017 12:30 PM Medical Record Number: [id number] Patient Account Number: [id number] Date of Birth/Sex: 01/06/1986 (31 y.o. Female) Treating RN: Roger Shelter Primary Care Jaylene Schrom: [name] Other Clinician: Referring Korin Hartwell: Referral, Self Treating Ralene Gasparyan/Extender: Melburn Hake, HOYT Weeks in Treatment: 0 Visit Information Patient Arrived: Ambulatory Arrival Time: 12:43 Accompanied By: self Transfer Assistance: None Patient Identification Verified: Yes Secondary Verification Process Yes Completed: Patient Has Alerts: Yes Patient Alerts: DMII REFUSED FACE PHOTO ABI 02/14/17 L 1.07 R 1.11 History Since Last Visit Added or deleted any medications: No Any new allergies or adverse reactions: No Had a fall or experienced change in activities of daily living that may affect risk of falls: No Signs or symptoms of abuse/neglect since last visito No Hospitalized since last visit: No Electronic Signature(s) Signed: 05/26/2017 2:35:19 PM By: Roger Shelter Entered By: Roger Shelter on 05/26/2017 12:52:17 [name] ([id number]) -------------------------------------------------------------------------------- Clinic Level of Care Assessment Details Patient Name: [name] Date of Service: 05/26/2017 12:30 PM Medical Record Number: [id number] Patient Account Number: [id number] Date of Birth/Sex: 1985/06/14 (31 y.o. Female) Treating RN: Carolyne Fiscal, Debi Primary Care Kristal Perl: [name] Other Clinician: Referring Ernie Sagrero: Referral, Self Treating Neema Barreira/Extender: STONE III, HOYT Weeks in Treatment: 0 Clinic Level of Care Assessment Items TOOL 1 Quantity Score X - Use when EandM and Procedure is performed on INITIAL visit 1 0 ASSESSMENTS - Nursing Assessment / Reassessment X - General Physical Exam (combine w/ comprehensive assessment (listed just below) when 1 20 performed on new pt. evals) X- 1 25 Comprehensive Assessment (HX, ROS, Risk Assessments, Wounds Hx, etc.) ASSESSMENTS - Wound and Skin Assessment / Reassessment []  - Dermatologic / Skin Assessment (not related to wound area) 0 ASSESSMENTS - Ostomy and/or Continence Assessment and Care []  - Incontinence Assessment and Management 0 []  - 0 Ostomy Care Assessment and Management (repouching, etc.) PROCESS - Coordination of Care X - Simple Patient / Family Education for ongoing care 1 15 []  - 0 Complex (extensive) Patient / Family Education for ongoing care []  - 0 Staff obtains Programmer, systems, Records, Test Results / Process Orders []  - 0 Staff telephones HHA, Nursing Homes / Clarify orders / etc []  - 0 Routine Transfer to another Facility (non-emergent condition) []  - 0 Routine Hospital Admission (non-emergent condition) X- 1 15 New Admissions / Biomedical engineer / Ordering NPWT, Apligraf, etc. []  - 0 Emergency Hospital Admission (emergent condition) PROCESS - Special Needs []  - Pediatric / Minor Patient Management 0 []  - 0 Isolation Patient Management []  - 0 Hearing /  Language / Visual special needs []  - 0 Assessment of Community assistance (transportation, D/C planning, etc.) []  - 0 Additional assistance / Altered mentation []  - 0 Support Surface(s) Assessment (bed, cushion, seat, etc.) SHERRELL, WEIR ([id number]) INTERVENTIONS - Miscellaneous []  - External ear exam 0 []  - 0 Patient Transfer (multiple staff / Civil Service fast streamer / Similar  devices) []  - 0 Simple Staple / Suture removal (25 or less) []  - 0 Complex Staple / Suture removal (26 or more) []  - 0 Hypo/Hyperglycemic Management (do not check if billed separately) X- 1 15 Ankle / Brachial Index (ABI) - do not check if billed separately Has the patient been seen at the hospital within the last three years: Yes Total Score: 90 Level Of Care: New/Established - Level 3 Electronic Signature(s) Signed: 05/26/2017 2:24:57 PM By: Alric Quan Entered By: Alric Quan on 05/26/2017 14:20:19 [name] ([id number]) -------------------------------------------------------------------------------- Encounter Discharge Information Details Patient Name: [name] Date of Service: 05/26/2017 12:30 PM Medical Record Number: [id number] Patient Account Number: [id number] Date of Birth/Sex: 07/31/1985 (31 y.o. Female) Treating RN: Carolyne Fiscal, Debi Primary Care Rodman Recupero: [name] Other Clinician: Referring Kuulei Kleier: Referral, Self Treating Matrice Herro/Extender: STONE III, HOYT Weeks in Treatment: 0 Encounter Discharge Information Items Discharge Pain Level: 0 Discharge Condition: Stable Ambulatory Status: Ambulatory Discharge Destination: Home Transportation: Private Auto Schedule Follow-up Appointment: Yes Medication Reconciliation completed and No provided to Patient/Care Trinette Vera: Provided on Clinical Summary of Care: 05/26/2017 Form Type Recipient Paper Patient DC Electronic Signature(s) Signed: 05/26/2017 2:35:19 PM By: Roger Shelter Entered By: Roger Shelter on 05/26/2017  13:34:49 [name] ([id number]) -------------------------------------------------------------------------------- Lower Extremity Assessment Details Patient Name: [name] Date of Service: 05/26/2017 12:30 PM Medical Record Number: [id number] Patient Account Number: [id number] Date of Birth/Sex: 1985-11-27 (31 y.o. Female) Treating RN: Roger Shelter Primary Care Rogene Meth: [name] Other Clinician: Referring Mills Mitton: Referral, Self Treating Nizhoni Parlow/Extender: STONE III, HOYT Weeks in Treatment: 0 Edema Assessment Assessed: [Left: No] [Right: No] Edema: [Left: No] [Right: No] Vascular Assessment Pulses: Dorsalis Pedis Palpable: [Right:Yes] Posterior Tibial Palpable: [Right:Yes] Extremity colors, hair growth, and conditions: Extremity Color: [Right:Normal] Hair Growth on Extremity: [Right:No] Temperature of Extremity: [Right:Warm] Capillary Refill: [Right:< 3 seconds] Toe Nail Assessment Left: Right: Thick: Yes Discolored: No Deformed: No Improper Length and Hygiene: No Electronic Signature(s) Signed: 05/26/2017 2:35:19 PM By: Roger Shelter Entered By: Roger Shelter on 05/26/2017 12:51:42 [name] ([id number]) -------------------------------------------------------------------------------- Multi Wound Chart Details Patient Name: [name] Date of Service: 05/26/2017 12:30 PM Medical Record Number: [id number] Patient Account Number: [id number] Date of Birth/Sex: 1986-02-01 (31 y.o. Female) Treating RN: Carolyne Fiscal, Debi Primary Care Kyndall Amero: [name] Other Clinician: Referring Commodore Bellew: Referral, Self Treating Ladd Cen/Extender: STONE III, HOYT Weeks in Treatment: 0 Vital Signs Height(in): 66 Pulse(bpm): 102 Weight(lbs): 172 Blood Pressure(mmHg): 120/81 Body Mass Index(BMI): 28 Temperature(F): 98.4 Respiratory Rate 18 (breaths/min): Photos: [N/A:N/A] Wound Location: Right Toe Great N/A N/A Wounding Event: Not Known N/A  N/A Primary Etiology: Diabetic Wound/Ulcer of the N/A N/A Lower Extremity Comorbid History: Anemia, Hypertension, Type II N/A N/A Diabetes, Osteoarthritis, Neuropathy, Seizure Disorder Date Acquired: 05/11/2017 N/A N/A Weeks of Treatment: 0 N/A N/A Wound Status: Open N/A N/A Pending Amputation on Yes N/A N/A Presentation: Measurements L x W x D 1x0.9x0.1 N/A N/A (cm) Area (cm) : 0.707 N/A N/A Volume (cm) : 0.071 N/A N/A Classification: Grade 1 N/A N/A Exudate Amount: Small N/A N/A Exudate Type: Serous N/A N/A Exudate Color: amber N/A N/A Wound Margin: Flat and Intact N/A N/A Granulation Amount: None Present (0%) N/A N/A Necrotic Amount: Large (67-100%) N/A N/A Necrotic Tissue: Eschar N/A N/A Exposed Structures: Fascia: No N/A N/A Fat Layer (Subcutaneous Tissue) Exposed: No Tendon: No Muscle: No SKII, CLELAND. ([id number]) Joint: No Bone: No Epithelialization: None N/A N/A Periwound Skin Texture: Scarring: Yes N/A N/A Excoriation: No Induration: No Callus: No Crepitus: No  Rash: No Periwound Skin Moisture: Maceration: No N/A N/A Dry/Scaly: No Periwound Skin Color: Atrophie Blanche: No N/A N/A Cyanosis: No Ecchymosis: No Erythema: No Hemosiderin Staining: No Mottled: No Pallor: No Rubor: No Temperature: No Abnormality N/A N/A Tenderness on Palpation: Yes N/A N/A Wound Preparation: Ulcer Cleansing: N/A N/A Rinsed/Irrigated with Saline Topical Anesthetic Applied: Other: lidocaine 4% Treatment Notes Electronic Signature(s) Signed: 05/26/2017 2:24:57 PM By: Alric Quan Entered By: Alric Quan on 05/26/2017 13:17:09 [name] ([id number]) -------------------------------------------------------------------------------- Pulpotio Bareas Details Patient Name: [name] Date of Service: 05/26/2017 12:30 PM Medical Record Number: [id number] Patient Account Number: [id number] Date of Birth/Sex: April 05, 1985 (31 y.o.  Female) Treating RN: Carolyne Fiscal, Debi Primary Care Terianne Thaker: [name] Other Clinician: Referring Alysson Geist: Referral, Self Treating Heela Heishman/Extender: STONE III, HOYT Weeks in Treatment: 0 Active Inactive ` Nutrition Nursing Diagnoses: Imbalanced nutrition Impaired glucose control: actual or potential Potential for alteratiion in Nutrition/Potential for imbalanced nutrition Goals: Patient/caregiver will maintain therapeutic glucose control Date Initiated: 05/26/2017 Target Resolution Date: 09/17/2017 Goal Status: Active Interventions: Assess patient nutrition upon admission and as needed per policy Provide education on elevated blood sugars and impact on wound healing Notes: ` Orientation to the Wound Care Program Nursing Diagnoses: Knowledge deficit related to the wound healing center program Goals: Patient/caregiver will verbalize understanding of the [place] Date Initiated: 05/26/2017 Target Resolution Date: 06/18/2017 Goal Status: Active Interventions: Provide education on orientation to the wound center Notes: ` Wound/Skin Impairment Nursing Diagnoses: Impaired tissue integrity Knowledge deficit related to ulceration/compromised skin integrity Goals: Ulcer/skin breakdown will have a volume reduction of 80% by week 12 DOSHIA, DALIA ([id number]) Date Initiated: 05/26/2017 Target Resolution Date: 09/17/2017 Goal Status: Active Interventions: Assess ulceration(s) every visit Notes: Electronic Signature(s) Signed: 05/26/2017 2:24:57 PM By: Alric Quan Entered By: Alric Quan on 05/26/2017 13:16:51 [name] ([id number]) -------------------------------------------------------------------------------- Pain Assessment Details Patient Name: [name] Date of Service: 05/26/2017 12:30 PM Medical Record Number: [id number] Patient Account Number: [id number] Date of Birth/Sex: 10/27/1985 (31 y.o. Female) Treating RN: Roger Shelter Primary Care Esco Joslyn: [name] Other Clinician: Referring Aryaan Persichetti: Referral, Self Treating Bertran Zeimet/Extender: STONE III, HOYT Weeks in Treatment: 0 Active Problems Location of Pain Severity and Description of Pain Patient Has Paino Yes Site Locations Pain Location: Pain in Ulcers With Dressing Change: Yes Duration of the Pain. Constant / Intermittento Constant Pain Management and Medication Current Pain Management: Notes Topical or injectable lidocaine is offered to patient for acute pain when surgical debridement is performed. If needed, Patient is instructed to use over the counter pain medication for the following 24-48 hours after debridement. Wound care MDs do not prescribed pain medications. Patient has chronic pain or uncontrolled pain. Patient has been instructed to make an appointment with their Primary Care Physician for pain management. Electronic Signature(s) Signed: 05/26/2017 2:35:19 PM By: Roger Shelter Entered By: Roger Shelter on 05/26/2017 12:45:11 [name] ([id number]) -------------------------------------------------------------------------------- Patient/Caregiver Education Details Patient Name: [name] Date of Service: 05/26/2017 12:30 PM Medical Record Number: [id number] Patient Account Number: [id number] Date of Birth/Gender: 1985-10-11 (31 y.o. Female) Treating RN: Roger Shelter Primary Care Physician: [name] Other Clinician: Referring Physician: Referral, Self Treating Physician/Extender: Melburn Hake, HOYT Weeks in Treatment: 0 Education Assessment Education Provided To: Patient Education Topics Provided Wound Debridement: Handouts: Wound Debridement Methods: Explain/Verbal Responses: State content correctly Wound/Skin Impairment: Handouts: Caring for Your Ulcer Methods: Explain/Verbal Responses: State content correctly Electronic Signature(s) Signed: 05/26/2017 2:35:19 PM By: Roger Shelter Entered  By: Roger Shelter  on 05/26/2017 13:35:43 JERLINE, LINZY ([id number]) -------------------------------------------------------------------------------- Wound Assessment Details Patient Name: TRAN, RANDLE Date of Service: 05/26/2017 12:30 PM Medical Record Number: [id number] Patient Account Number: [id number] Date of Birth/Sex: [birth year] (31 y.o. Female) Treating RN: Roger Shelter Primary Care Arvell Pulsifer: [name] Other Clinician: Referring Madalee Altmann: Referral, Self Treating Carlyle Achenbach/Extender: STONE III, HOYT Weeks in Treatment: 0 Wound Status Wound Number: 3 Primary Diabetic Wound/Ulcer of the Lower Extremity Etiology: Wound Location: Right Toe Great Wound Open Wounding Event: Not Known Status: Date Acquired: 05/11/2017 Comorbid Anemia, Hypertension, Type II Diabetes, Weeks Of Treatment: 0 History: Osteoarthritis, Neuropathy, Seizure Disorder Clustered Wound: No Pending Amputation On Presentation Photos Photo Uploaded By: Montey Hora on 05/26/2017 12:55:56 Wound Measurements Length: (cm) 1 Width: (cm) 0.9 Depth: (cm) 0.1 Area: (cm) 0.707 Volume: (cm) 0.071 % Reduction in Area: % Reduction in Volume: Epithelialization: None Tunneling: No Undermining: No Wound Description Classification: Grade 1 Wound Margin: Flat and Intact Exudate Amount: Small Exudate Type: Serous Exudate Color: amber Foul Odor After Cleansing: No Slough/Fibrino No Wound Bed Granulation Amount: None Present (0%) Exposed Structure Necrotic Amount: Large (67-100%) Fascia Exposed: No Necrotic Quality: Eschar Fat Layer (Subcutaneous Tissue) Exposed: No Tendon Exposed: No Muscle Exposed: No Joint Exposed: No Bone Exposed: No Periwound Skin Texture SHAWNI, VOLKOV. ([id number]) Texture Color No Abnormalities Noted: No No Abnormalities Noted: No Callus: No Atrophie Blanche: No Crepitus: No Cyanosis: No Excoriation: No Ecchymosis: No Induration: No Erythema: No Rash:  No Hemosiderin Staining: No Scarring: Yes Mottled: No Pallor: No Moisture Rubor: No No Abnormalities Noted: No Dry / Scaly: No Temperature / Pain Maceration: No Temperature: No Abnormality Tenderness on Palpation: Yes Wound Preparation Ulcer Cleansing: Rinsed/Irrigated with Saline Topical Anesthetic Applied: Other: lidocaine 4%, Treatment Notes Wound #3 (Right Toe Great) 1. Cleansed with: Clean wound with Normal Saline 2. Anesthetic Topical Lidocaine 4% cream to wound bed prior to debridement 4. Dressing Applied: Santyl Ointment 5. Secondary Dressing Applied Dry Gauze Notes conform tape and stretch net Electronic Signature(s) Signed: 05/26/2017 2:35:19 PM By: Roger Shelter Entered By: Roger Shelter on 05/26/2017 12:49:53 [name] ([id number]) -------------------------------------------------------------------------------- Vitals Details Patient Name: [name] Date of Service: 05/26/2017 12:30 PM Medical Record Number: [id number] Patient Account Number: [id number] Date of Birth/Sex: 1985-09-01 (31 y.o. Female) Treating RN: Roger Shelter Primary Care Deepa Barthel: [name] Other Clinician: Referring Makaylyn Sinyard: Referral, Self Treating Latrenda Irani/Extender: STONE III, HOYT Weeks in Treatment: 0 Vital Signs Time Taken: 12:45 Temperature (F): 98.4 Height (in): 66 Pulse (bpm): 102 Source: Measured Respiratory Rate (breaths/min): 18 Weight (lbs): 172 Blood Pressure (mmHg): 120/81 Source: Measured Reference Range: 80 - 120 mg / dl Body Mass Index (BMI): 27.8 Electronic Signature(s) Signed: 05/26/2017 2:35:19 PM By: Roger Shelter Entered By: Roger Shelter on 05/26/2017 12:45:55

## 2017-05-29 NOTE — Progress Notes (Signed)
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 conducted at 13:19, prior to the start of the procedure. A Minimum amount of bleeding  was controlled with Pressure. The procedure was tolerated well with a pain level of 0 throughout and a pain level of 0 following the procedure. Post Debridement Measurements: 1cm length x 0.9cm width x 0.2cm depth; 0.141cm^3 volume. Character of Wound/Ulcer Post Debridement requires further debridement. Severity of Tissue Post Debridement is: Fat layer exposed. Post procedure Diagnosis Wound #3: Same as Pre-Procedure Plan Wound Cleansing: Wound #3 Right Toe Great: Clean wound with Normal Saline. Cleanse wound with mild soap and water Anesthetic (add to Medication List): Wound #3 Right Toe Great: Topical Lidocaine 4% cream applied to wound bed prior to debridement (In Clinic Only). Primary Wound Dressing: Wound #3 Right Toe Great: Santyl Ointment Secondary Dressing: Wound #3 Right Toe Great: Dry Gauze Saline  moistened gauze Conform/Kerlix Dressing Change Frequency: LYNNELLE, MESMER ([id number]) Wound #3 Right Toe Great: Change dressing every day. Follow-up Appointments: Wound #3 Right Toe Great: Return Appointment in 1 week. Additional Orders / Instructions: Wound #3 Right Toe Great: Increase protein intake. Radiology ordered were: X-ray, foot - right The following medication(s) was prescribed: lidocaine topical 4 % cream 1 1 cream topical was prescribed at facility Santyl topical 250 unit/gram ointment ointment topical applied nickel thick to the wound bed and then covered with a dressing as directed starting 05/26/2017 I'm going to recommend currently that we initiate treatment with Santyl we will see how things do over the next week. Patient is in agreement with plan. I am going to also recommend that the patient discontinue the use of this pumis stone as I feel like this is causing more injury than good in regard to her feet. I am gonna send her for an x-ray of the right foot to evaluate for osteomyelitis of her toe. Patient was going to leave our clinic and go over there for the x-ray to be performed. At the time of this dictation that results still was not present in the system although it does appear that she went it just has not been read. Otherwise we are gonna see her for fault evaluation next week to see were things stand. Please see above for specific wound care orders. We will see patient for re-evaluation in 1 week(s) here in the clinic. If anything worsens or changes patient will contact our office for additional recommendations. Electronic Signature(s) Signed: 05/27/2017 6:13:52 PM By: Worthy [name] PA-C Entered By: Worthy [name] on 05/26/2017 15:46:07 [name] ([id number]) -------------------------------------------------------------------------------- ROS/PFSH Details Patient Name: [name] Date of Service: 05/26/2017 12:30 PM Medical Record Number:  [id number] Patient Account Number: [id number] Date of Birth/Sex: October 06, 1985 (31 y.o. Female) Treating RN: Roger Shelter Primary Care Provider: [name] Other Clinician: Referring Provider: Referral, Self Treating Provider/Extender: STONE III, HOYT Weeks in Treatment: 0 Information Obtained From Patient Wound History Do you currently have one or more open woundso Yes How many open wounds do you currently haveo 1 Approximately how long have you had your woundso 3 weeks How have you been treating your wound(s) until nowo prisma Has your wound(s) ever healed and then re-openedo No Have you had any lab work done in the past montho No Have you tested positive for an antibiotic resistant organism (MRSA, VRE)o No Have you tested positive for osteomyelitis (bone infection)o No Have you had any tests for circulation on your legso No Constitutional Symptoms (General Health) Complaints and Symptoms: No Complaints or Symptoms Eyes Complaints and Symptoms: No Complaints or Symptoms Ear/Nose/Mouth/Throat Complaints and Symptoms: No Complaints or Symptoms Hematologic/Lymphatic Complaints and Symptoms: No  Complaints or Symptoms Medical History: Positive for: Anemia Respiratory Complaints and Symptoms: No Complaints or Symptoms Cardiovascular Complaints and Symptoms: No Complaints or Symptoms Medical History: Positive for: Hypertension TENIYAH, SEIVERT ([id number]) Endocrine Medical History: Positive for: Type II Diabetes Time with diabetes: 2014 Treated with: Insulin Blood sugar tested every day: Yes Tested : 3x a day Genitourinary Complaints and Symptoms: No Complaints or Symptoms Medical History: Past Medical History Notes: kidney transplant 2007 Immunological Complaints and Symptoms: No Complaints or Symptoms Integumentary (Skin) Complaints and Symptoms: No Complaints or Symptoms Musculoskeletal Complaints and Symptoms: No Complaints or Symptoms Medical  History: Positive for: Osteoarthritis Neurologic Complaints and Symptoms: No Complaints or Symptoms Medical History: Positive for: Neuropathy; Seizure Disorder - hx Oncologic Complaints and Symptoms: No Complaints or Symptoms Medical History: Negative for: Received Chemotherapy; Received Radiation Psychiatric Complaints and Symptoms: No Complaints or Symptoms Immunizations Pneumococcal Vaccine: Received Pneumococcal Vaccination: No QUIARA, KILLIAN ([id number]) Implantable Devices Family and Social History Cancer: No; Diabetes: Yes - Father; Heart Disease: Yes - Father; Hereditary Spherocytosis: No; Hypertension: Yes - Mother,Father; Kidney Disease: No; Lung Disease: No; Seizures: No; Stroke: No; Thyroid Problems: No; Tuberculosis: No; Never smoker; Marital Status - Single; Alcohol Use: Never; Drug Use: No History; Caffeine Use: Daily; Financial Concerns: No; Food, Clothing or Shelter Needs: No; Support System Lacking: No; Transportation Concerns: No; Advanced Directives: No; Patient does not want information on Advanced Directives; Do not resuscitate: No; Living Will: No; Medical Power of Attorney: No Electronic Signature(s) Signed: 05/26/2017 2:35:19 PM By: Roger Shelter Signed: 05/27/2017 6:13:52 PM By: Worthy [name] PA-C Entered By: Roger Shelter on 05/26/2017 12:47:30 [name] ([id number]) -------------------------------------------------------------------------------- SuperBill Details Patient Name: [name] Date of Service: 05/26/2017 Medical Record Number: [id number] Patient Account Number: [id number] Date of Birth/Sex: 1986/01/03 (31 y.o. Female) Treating RN: Carolyne Fiscal, Debi Primary Care Provider: [name] Other Clinician: Referring Provider: Referral, Self Treating Provider/Extender: Melburn Hake, HOYT Weeks in Treatment: 0 Diagnosis Coding ICD-10 Codes Code Description E11.621 Type 2 diabetes mellitus with foot ulcer L97.512 Non-pressure  chronic ulcer of other part of right foot with fat layer exposed Z94.0 Kidney transplant status I10 Essential (primary) hypertension Facility Procedures CPT4 Code: 59935701 Description: [facility] VISIT-LEV 3 EST PT Modifier: Quantity: 1 CPT4 Code: 77939030 Description: 11042 - DEB SUBQ TISSUE 20 SQ CM/< ICD-10 Diagnosis Description L97.512 Non-pressure chronic ulcer of other part of right foot with fat Modifier: layer exposed Quantity: 1 Physician Procedures CPT4 Code: 0923300 Description: 76226 - WC PHYS LEVEL 4 - EST PT ICD-10 Diagnosis Description E11.621 Type 2 diabetes mellitus with foot ulcer L97.512 Non-pressure chronic ulcer of other part of right foot with fat Z94.0 Kidney transplant status I10 Essential (primary)  hypertension Modifier: 25 layer exposed Quantity: 1 CPT4 Code: [id number] Description: 11042 - WC PHYS SUBQ TISS 20 SQ CM ICD-10 Diagnosis Description L97.512 Non-pressure chronic ulcer of other part of right foot with fat Modifier: layer exposed Quantity: 1 Electronic Signature(s) Signed: 05/27/2017 6:13:52 PM By: Worthy [name] PA-C Entered By: Worthy [name] on 05/26/2017 15:46:32

## 2017-05-30 ENCOUNTER — Encounter: Payer: Medicare Other | Admitting: Internal Medicine

## 2017-05-30 ENCOUNTER — Ambulatory Visit: Payer: Medicare Other | Admitting: Physician Assistant

## 2017-06-02 ENCOUNTER — Encounter: Payer: Medicare Other | Admitting: Nurse Practitioner

## 2017-06-02 DIAGNOSIS — E1140 Type 2 diabetes mellitus with diabetic neuropathy, unspecified: Secondary | ICD-10-CM | POA: Diagnosis not present

## 2017-06-02 DIAGNOSIS — E11621 Type 2 diabetes mellitus with foot ulcer: Secondary | ICD-10-CM | POA: Diagnosis not present

## 2017-06-02 DIAGNOSIS — I10 Essential (primary) hypertension: Secondary | ICD-10-CM | POA: Diagnosis not present

## 2017-06-02 DIAGNOSIS — Z940 Kidney transplant status: Secondary | ICD-10-CM | POA: Diagnosis not present

## 2017-06-02 DIAGNOSIS — L97512 Non-pressure chronic ulcer of other part of right foot with fat layer exposed: Secondary | ICD-10-CM | POA: Diagnosis not present

## 2017-06-02 DIAGNOSIS — Z794 Long term (current) use of insulin: Secondary | ICD-10-CM | POA: Diagnosis not present

## 2017-06-02 LAB — GLUCOSE, CAPILLARY: GLUCOSE-CAPILLARY: 83 mg/dL (ref 65–99)

## 2017-06-03 ENCOUNTER — Other Ambulatory Visit: Payer: Self-pay | Admitting: Physician Assistant

## 2017-06-03 DIAGNOSIS — L97512 Non-pressure chronic ulcer of other part of right foot with fat layer exposed: Secondary | ICD-10-CM

## 2017-06-03 NOTE — Progress Notes (Signed)
[name] ([id number]) Visit Report for 06/02/2017 Chief Complaint Document Details Patient Name: [name] Date of Service: 06/02/2017 3:00 PM Medical Record Number: [id number] Patient Account Number: [id number] Date of Birth/Sex: 01/01/1986 (31 y.o. Female) Treating RN: Carolyne Fiscal, Debi Primary Care Provider: [name] Other Clinician: Referring Provider: [name] Treating Provider/Extender: Cathie Olden in Treatment: 1 Information Obtained from: Patient Chief Complaint She is here in follow evaluation of right great toe ulcer Electronic Signature(s) Signed: 06/02/2017 3:50:30 PM By: Lawanda Cousins Entered By: Lawanda Cousins on 06/02/2017 15:50:30 [name] ([id number]) -------------------------------------------------------------------------------- Debridement Details Patient Name: [name] Date of Service: 06/02/2017 3:00 PM Medical Record Number: [id number] Patient Account Number: [id number] Date of Birth/Sex: 05-15-1985 (31 y.o. Female) Treating RN: Carolyne Fiscal, Debi Primary Care Provider: [name] Other Clinician: Referring Provider: [name] Treating Provider/Extender: Cathie Olden in Treatment: 1 Debridement Performed for Wound #3 Right Toe Great Assessment: Performed By: Physician Lawanda Cousins, NP Debridement: Debridement Severity of Tissue Pre Fat layer exposed Debridement: Pre-procedure Verification/Time Yes - 15:40 Out Taken: Start Time: 15:41 Pain Control: Lidocaine 4% Topical Solution Level: Skin/Subcutaneous Tissue Total Area Debrided (L x W): 1.5 (cm) x 1 (cm) = 1.5 (cm) Tissue and other material Non-Viable, Exudate, Subcutaneous debrided: Instrument: Curette Bleeding: Minimum Hemostasis Achieved: Pressure End Time: 15:43 Procedural Pain: 0 Post Procedural Pain: 0 Response to Treatment: Procedure was tolerated well Post Debridement Measurements of Total Wound Length: (cm) 1.5 Width: (cm)  1 Depth: (cm) 0.2 Volume: (cm) 0.236 Character of Wound/Ulcer Post Debridement: Requires Further Debridement Severity of Tissue Post Debridement: Fat layer exposed Post Procedure Diagnosis Same as Pre-procedure Electronic Signature(s) Signed: 06/02/2017 3:50:09 PM By: Lawanda Cousins Entered By: Lawanda Cousins on 06/02/2017 15:50:09 [name] ([id number]) -------------------------------------------------------------------------------- HPI Details Patient Name: [name] Date of Service: 06/02/2017 3:00 PM Medical Record Number: [id number] Patient Account Number: [id number] Date of Birth/Sex: October 18, 1985 (31 y.o. Female) Treating RN: Carolyne Fiscal, Debi Primary Care Provider: [name] Other Clinician: Referring Provider: [name] Treating Provider/Extender: Cathie Olden in Treatment: 1 History of Present Illness HPI Description: 02/14/17 on evaluation today patient appears to be doing fairly well all things considered. She tells me that around the middle of November she was sleeping close to a space heater when she woke up and sustained a burn to her right first toe. She has a history of diabetes which is uncontrolled her last hemoglobin A1c with 11.5 on December 12, 2016. She is status post having had a kidney transplant and this was necessitated by apparently a high dose of antibiotics given to her as a child. Overall she has been tolerating the wound fairly well all things considered she has been putting antibiotic ointment on the area but otherwise no other treatment. She did go to the ER twice the station left without being seen due to the long wait. She continues to have discomfort rated to be 3-4/10 which is worse with toucher cleansing of the wound. 02/24/2017 -- she has uncontrolled diabetes mellitus with hyperglycemia and her last hemoglobin A1c was 14%. I have asked her to be more careful and see her PCP regarding this. She is also not wearing bilateral  compression stockings as recommended before. 03/18/17 on evaluation today patient appears to be doing very well and in fact her wound appears to be completely healed. She has been tolerating the dressing changes without complication. Fortunately she is having no pain. *** 05/26/17 patient seen today for reevaluation concerning her right great toe  ulcer. She has previously been evaluated by myself in January through the beginning of the year before subsequently being discharged. She was completely healed at that point. Unfortunately patient tells me that she's unsure of exactly what happened but this wound has reopened. Upon hearing her story it sounds as if she likely injured this utilizing a pumis stone that she uses to work on her calluses. At one point she even asked me if there was a different way that she could potentially work on all of the callous and dry skin on her foot other than utilizing the stone. With that being said her story at this point was that she felt that she may have burnt her foot specifically the great toe on a space heater that she keeps on the bed stand beside her bed. With that being said I do not think that's very likely the toes around and all the surrounding region does not appear to show any signs of thermal injury nor does this ulcer appear to really be thermal in nature. It very much looks more like a diabetic foot ulcer. Patient's most recent hemoglobin A1c was 11.2 that was in January 2019. She has not had this check since she tells me that her blood sugars run in the "300s". Otherwise not much has really changed since I saw her previously. 06/02/17- She is here in follow-up evaluation for right great toe ulcer. Plain film x-ray revealed evidence worrisome for osteomyelitis, will order MRI; we discussed these findings. She presents to the clinic with feelings of hypoglycemia, she was provided with an orange juice in her glucose was 83; despite this being a normal  glucose level am not surprised she is having hypoglycemic symptoms. She tolerated debridement. We discussed the need for tight glycemic control (her a1c has been 11 in Nov and Jan), offloading/reduced trauma and compliance with medical treatment plan. A consult for ID was placed Electronic Signature(s) Signed: 06/02/2017 4:32:57 PM By: Lawanda Cousins Previous Signature: 06/02/2017 4:18:50 PM Version By: Lawanda Cousins Previous Signature: 06/02/2017 3:27:24 PM Version By: Lawanda Cousins Entered By: Lawanda Cousins on 06/02/2017 16:32:56 [name] ([id number]) -------------------------------------------------------------------------------- Physician Orders Details Patient Name: [name] Date of Service: 06/02/2017 3:00 PM Medical Record Number: [id number] Patient Account Number: [id number] Date of Birth/Sex: [birth year] (31 y.o. Female) Treating RN: Carolyne Fiscal, Debi Primary Care Provider: [name] Other Clinician: Referring Provider: [name] Treating Provider/Extender: Cathie Olden in Treatment: 1 Verbal / Phone Orders: Yes Clinician: Pinkerton, Debi Read Back and Verified: Yes Diagnosis Coding Wound Cleansing Wound #3 Right Toe Great o Clean wound with Normal Saline. o Cleanse wound with mild soap and water Anesthetic (add to Medication List) Wound #3 Right Toe Great o Topical Lidocaine 4% cream applied to wound bed prior to debridement (In Clinic Only). Primary Wound Dressing Wound #3 Right Toe Great o Santyl Ointment Secondary Dressing Wound #3 Right Toe Great o Dry Gauze o Saline moistened gauze o Conform/Kerlix Dressing Change Frequency Wound #3 Right Toe Great o Change dressing every day. Follow-up Appointments Wound #3 Right Toe Great o Return Appointment in 1 week. Off-Loading Wound #3 Right Toe Great o Open toe surgical shoe to: Additional Orders / Instructions Wound #3 Right Toe Great o Increase protein  intake. Consults o Infectious Disease Radiology o MRI, lower extremity with/without contrast Valerie, [name] ([id number]) Patient Medications Allergies: Benadryl, Shellfish Containing Products, banana, nitrofurantoin, Infed, penicillin, doxycycline Notifications Medication Indication Start End lidocaine DOSE 1 - topical 4 % cream -  1 cream topical Santyl 06/03/2017 DOSE topical 250 unit/gram ointment - ointment topical to right great toe daily Levaquin 06/03/2017 DOSE oral 500 mg tablet - tablet oral 500mg  x1 loading dose Levaquin 06/04/2017 DOSE oral 250 mg tablet - tablet oral daily for 14 days Electronic Signature(s) Signed: 06/03/2017 4:51:58 PM By: Lawanda Cousins Previous Signature: 06/02/2017 6:00:01 PM Version By: Lawanda Cousins Previous Signature: 06/02/2017 4:22:33 PM Version By: Lawanda Cousins Entered By: Lawanda Cousins on 06/03/2017 16:51:58 [name] ([id number]) -------------------------------------------------------------------------------- Prescription 06/02/2017 Patient Name: [name] Provider: Lawanda Cousins NP Date of Birth: 21-Aug-1985 NPI#: [id number] Sex: F DEA#: [id number] Phone #: [phone] License #: Patient Address: [facility] [place], [place] [address] [address], [place], [place] [address] [phone] Allergies Benadryl Reaction: anaphalaxis Severity: Severe Shellfish Containing Products banana nitrofurantoin Infed penicillin doxycycline Medication Medication: Route: Strength: Form: lidocaine 4 % topical cream topical 4% cream Class: TOPICAL LOCAL ANESTHETICS Dose: Frequency / Time: Indication: 1 1 cream topical Number of Refills: Number of Units: 0 Generic Substitution: Start Date: End Date: One Time Use: Substitution Permitted No Note to Pharmacy: Signature(s): Date(s): Valerie, [name] ([id number]) Electronic  Signature(s) Unsigned Previous Signature: 06/03/2017 8:01:31 AM Version By: Lawanda Cousins Entered By: Lawanda Cousins on 06/03/2017 16:51:59 Signature(s): [name] ([id number]) Date(s): --------------------------------------------------------------------------------  Problem List Details Patient Name: [name] Date of Service: 06/02/2017 3:00 PM Medical Record Number: [id number] Patient Account Number: [id number] Date of Birth/Sex: Nov 09, 1985 (31 y.o. Female) Treating RN: Carolyne Fiscal, Debi Primary Care Provider: [name] Other Clinician: Referring Provider: [name] Treating Provider/Extender: Cathie Olden in Treatment: 1 Active Problems ICD-10 Encounter Code Description Active Date Diagnosis E11.621 Type 2 diabetes mellitus with foot ulcer 05/26/2017 Yes L97.512 Non-pressure chronic ulcer of other part of right foot with fat layer 05/26/2017 Yes exposed Z94.0 Kidney transplant status 05/26/2017 Yes I10 Essential (primary) hypertension 05/26/2017 Yes Inactive Problems Resolved Problems Electronic Signature(s) Signed: 06/02/2017 3:49:48 PM By: Lawanda Cousins Entered By: Lawanda Cousins on 06/02/2017 15:49:48 [name] ([id number]) -------------------------------------------------------------------------------- Progress Note Details Patient Name: [name] Date of Service: 06/02/2017 3:00 PM Medical Record Number: [id number] Patient Account Number: [id number] Date of Birth/Sex: 1985/08/09 (31 y.o. Female) Treating RN: Carolyne Fiscal, Debi Primary Care Provider: [name] Other Clinician: Referring Provider: [name] Treating Provider/Extender: Cathie Olden in Treatment: 1 Subjective Chief Complaint Information obtained from Patient She is here in follow evaluation of right great toe ulcer History of Present Illness (HPI) 02/14/17 on evaluation today patient appears to be doing fairly well all things considered. She tells me that  around the middle of November she was sleeping close to a space heater when she woke up and sustained a burn to her right first toe. She has a history of diabetes which is uncontrolled her last hemoglobin A1c with 11.5 on December 12, 2016. She is status post having had a kidney transplant and this was necessitated by apparently a high dose of antibiotics given to her as a child. Overall she has been tolerating the wound fairly well all things considered she has been putting antibiotic ointment on the area but otherwise no other treatment. She did go to the ER twice the station left without being seen due to the long wait. She continues to have discomfort rated to be 3-4/10 which is worse with toucher cleansing of the wound. 02/24/2017 -- she has uncontrolled diabetes mellitus with hyperglycemia and her  last hemoglobin A1c was 14%. I have asked her to be more careful and see her PCP regarding this. She is also not wearing bilateral compression stockings as recommended before. 03/18/17 on evaluation today patient appears to be doing very well and in fact her wound appears to be completely healed. She has been tolerating the dressing changes without complication. Fortunately she is having no pain. *** 05/26/17 patient seen today for reevaluation concerning her right great toe ulcer. She has previously been evaluated by myself in January through the beginning of the year before subsequently being discharged. She was completely healed at that point. Unfortunately patient tells me that she's unsure of exactly what happened but this wound has reopened. Upon hearing her story it sounds as if she likely injured this utilizing a pumis stone that she uses to work on her calluses. At one point she even asked me if there was a different way that she could potentially work on all of the callous and dry skin on her foot other than utilizing the stone. With that being said her story at this point was that she felt  that she may have burnt her foot specifically the great toe on a space heater that she keeps on the bed stand beside her bed. With that being said I do not think that's very likely the toes around and all the surrounding region does not appear to show any signs of thermal injury nor does this ulcer appear to really be thermal in nature. It very much looks more like a diabetic foot ulcer. Patient's most recent hemoglobin A1c was 11.2 that was in January 2019. She has not had this check since she tells me that her blood sugars run in the "300s". Otherwise not much has really changed since I saw her previously. 06/02/17- She is here in follow-up evaluation for right great toe ulcer. Plain film x-ray revealed evidence worrisome for osteomyelitis, will order MRI; we discussed these findings. She presents to the clinic with feelings of hypoglycemia, she was provided with an orange juice in her glucose was 83; despite this being a normal glucose level am not surprised she is having hypoglycemic symptoms. She tolerated debridement. We discussed the need for tight glycemic control (her a1c has been 11 in Nov and Jan), offloading/reduced trauma and compliance with medical treatment plan. A consult for ID was placed Patient History Information obtained from Patient. Allergies Shellfish Containing Products, Benadryl (Severity: Severe, Reaction: anaphalaxis), banana, nitrofurantoin, Infed, penicillin, doxycycline Valerie, [name] ([id number]) Family History Diabetes - Father, Heart Disease - Father, Hypertension - Mother,Father, No family history of Cancer, Hereditary Spherocytosis, Kidney Disease, Lung Disease, Seizures, Stroke, Thyroid Problems, Tuberculosis. Social History Never smoker, Marital Status - Single, Alcohol Use - Never, Drug Use - No History, Caffeine Use - Daily. Medical And Surgical History Notes Genitourinary kidney transplant 2007 Objective Constitutional Vitals Time Taken: 3:17 PM,  Height: 66 in, Weight: 172 lbs, BMI: 27.8, Temperature: 98.5 F, Pulse: 98 bpm, Respiratory Rate: 18 breaths/min, Blood Pressure: 130/95 mmHg. General Notes: patient states upon entering treatment room she fels her blood sugar is low. Gave orange juice and with order from [facility] NP checked patients finger stick blood sugar level at 83. Patient states she was feeling "gittery". Also gave her peanut butter crackers Integumentary (Hair, Skin) Wound #3 status is Open. Original cause of wound was Not Known. The wound is located on the Right Toe Great. The wound measures 1.5cm length x 1cm width x 0.1cm depth; 1.178cm^2  area and 0.118cm^3 volume. There is Fat Layer (Subcutaneous Tissue) Exposed exposed. There is no tunneling or undermining noted. There is a small amount of serous drainage noted. The wound margin is flat and intact. There is no granulation within the wound bed. There is a large (67-100%) amount of necrotic tissue within the wound bed including Eschar and Adherent Slough. The periwound skin appearance did not exhibit: Callus, Crepitus, Excoriation, Induration, Rash, Scarring, Dry/Scaly, Maceration, Atrophie Blanche, Cyanosis, Ecchymosis, Hemosiderin Staining, Mottled, Pallor, Rubor, Erythema. Periwound temperature was noted as No Abnormality. The periwound has tenderness on palpation. Assessment Active Problems ICD-10 E11.621 - Type 2 diabetes mellitus with foot ulcer L97.512 - Non-pressure chronic ulcer of other part of right foot with fat layer exposed Z94.0 - Kidney transplant status I10 - Essential (primary) hypertension Valerie, [name] ([id number]) Procedures Wound #3 Pre-procedure diagnosis of Wound #3 is a Diabetic Wound/Ulcer of the Lower Extremity located on the Right Toe Great .Severity of Tissue Pre Debridement is: Fat layer exposed. There was a Skin/Subcutaneous Tissue Debridement ([id number]) debridement with total area of 1.5 sq cm performed by Lawanda Cousins,  NP. with the following instrument(s): Curette to remove Non-Viable tissue/material including Exudate and Subcutaneous after achieving pain control using Lidocaine 4% Topical Solution. A time out was conducted at 15:40, prior to the start of the procedure. A Minimum amount of bleeding was controlled with Pressure. The procedure was tolerated well with a pain level of 0 throughout and a pain level of 0 following the procedure. Post Debridement Measurements: 1.5cm length x 1cm width x 0.2cm depth; 0.236cm^3 volume. Character of Wound/Ulcer Post Debridement requires further debridement. Severity of Tissue Post Debridement is: Fat layer exposed. Post procedure Diagnosis Wound #3: Same as Pre-Procedure Plan Wound Cleansing: Wound #3 Right Toe Great: Clean wound with Normal Saline. Cleanse wound with mild soap and water Anesthetic (add to Medication List): Wound #3 Right Toe Great: Topical Lidocaine 4% cream applied to wound bed prior to debridement (In Clinic Only). Primary Wound Dressing: Wound #3 Right Toe Great: Santyl Ointment Secondary Dressing: Wound #3 Right Toe Great: Dry Gauze Saline moistened gauze Conform/Kerlix Dressing Change Frequency: Wound #3 Right Toe Great: Change dressing every day. Follow-up Appointments: Wound #3 Right Toe Great: Return Appointment in 1 week. Off-Loading: Wound #3 Right Toe Great: Open toe surgical shoe to: Additional Orders / Instructions: Wound #3 Right Toe Great: Increase protein intake. Consults ordered were: Infectious Disease Radiology ordered were: MRI, lower extremity with/without contrast The following medication(s) was prescribed: lidocaine topical 4 % cream 1 1 cream topical was prescribed at facility Santyl topical 250 unit/gram ointment ointment topical to right great toe daily starting 06/03/2017 Valerie, [name]. ([id number]) 1. santyl daily 2. offload toe 3. tight glycemic control 4. ID referral 5. MRI 6. 7. follow up next  week Electronic Signature(s) Signed: 06/03/2017 8:02:52 AM By: Lawanda Cousins Previous Signature: 06/02/2017 4:37:59 PM Version By: Lawanda Cousins Entered By: Lawanda Cousins on 06/03/2017 08:02:52 [name] ([id number]) -------------------------------------------------------------------------------- ROS/PFSH Details Patient Name: [name] Date of Service: 06/02/2017 3:00 PM Medical Record Number: [id number] Patient Account Number: [id number] Date of Birth/Sex: [birth year] (31 y.o. Female) Treating RN: Carolyne Fiscal, Debi Primary Care Provider: [name] Other Clinician: Referring Provider: [name] Treating Provider/Extender: Cathie Olden in Treatment: 1 Information Obtained From Patient Wound History Do you currently have one or more open woundso Yes How many open wounds do you currently haveo 1 Approximately how long have you had your woundso 3 weeks How have  you been treating your wound(s) until nowo prisma Has your wound(s) ever healed and then re-openedo No Have you had any lab work done in the past montho No Have you tested positive for an antibiotic resistant organism (MRSA, VRE)o No Have you tested positive for osteomyelitis (bone infection)o No Have you had any tests for circulation on your legso No Hematologic/Lymphatic Medical History: Positive for: Anemia Cardiovascular Medical History: Positive for: Hypertension Endocrine Medical History: Positive for: Type II Diabetes Time with diabetes: 2014 Treated with: Insulin Blood sugar tested every day: Yes Tested : 3x a day Genitourinary Medical History: Past Medical History Notes: kidney transplant 2007 Musculoskeletal Medical History: Positive for: Osteoarthritis Neurologic Medical History: Positive for: Neuropathy; Seizure Disorder - hx Oncologic Valerie, [name] ([id number]) Medical History: Negative for: Received Chemotherapy; Received Radiation Immunizations Pneumococcal  Vaccine: Received Pneumococcal Vaccination: No Implantable Devices Family and Social History Cancer: No; Diabetes: Yes - Father; Heart Disease: Yes - Father; Hereditary Spherocytosis: No; Hypertension: Yes - Mother,Father; Kidney Disease: No; Lung Disease: No; Seizures: No; Stroke: No; Thyroid Problems: No; Tuberculosis: No; Never smoker; Marital Status - Single; Alcohol Use: Never; Drug Use: No History; Caffeine Use: Daily; Financial Concerns: No; Food, Clothing or Shelter Needs: No; Support System Lacking: No; Transportation Concerns: No; Advanced Directives: No; Patient does not want information on Advanced Directives; Do not resuscitate: No; Living Will: No; Medical Power of Attorney: No Physician Affirmation I have reviewed and agree with the above information. Electronic Signature(s) Signed: 06/03/2017 8:01:31 AM By: Lawanda Cousins Entered By: Lawanda Cousins on 06/02/2017 16:36:47 [name] ([id number]) -------------------------------------------------------------------------------- SuperBill Details Patient Name: [name] Date of Service: 06/02/2017 Medical Record Number: [id number] Patient Account Number: [id number] Date of Birth/Sex: September 07, 1985 (31 y.o. Female) Treating RN: Carolyne Fiscal, Debi Primary Care Provider: [name] Other Clinician: Referring Provider: [name] Treating Provider/Extender: Cathie Olden in Treatment: 1 Diagnosis Coding ICD-10 Codes Code Description E11.621 Type 2 diabetes mellitus with foot ulcer L97.512 Non-pressure chronic ulcer of other part of right foot with fat layer exposed Z94.0 Kidney transplant status I10 Essential (primary) hypertension Facility Procedures CPT4 Code: [id number] Description: 56387 - DEB SUBQ TISSUE 20 SQ CM/< ICD-10 Diagnosis Description E11.621 Type 2 diabetes mellitus with foot ulcer L97.512 Non-pressure chronic ulcer of other part of right foot with fat Modifier: layer exposed Quantity:  1 Physician Procedures CPT4 Code: [id number] Description: 51884 - WC PHYS SUBQ TISS 20 SQ CM ICD-10 Diagnosis Description E11.621 Type 2 diabetes mellitus with foot ulcer L97.512 Non-pressure chronic ulcer of other part of right foot with fat Modifier: layer exposed Quantity: 1 Electronic Signature(s) Signed: 06/02/2017 4:38:11 PM By: Lawanda Cousins Entered By: Lawanda Cousins on 06/02/2017 16:38:11

## 2017-06-09 ENCOUNTER — Ambulatory Visit (HOSPITAL_COMMUNITY)
Admission: RE | Admit: 2017-06-09 | Discharge: 2017-06-09 | Disposition: A | Discharge status: Home / Self Care | Payer: Medicare Other | Source: Ambulatory Visit | Attending: Nephrology | Admitting: Nephrology

## 2017-06-09 ENCOUNTER — Encounter: Payer: Medicare Other | Admitting: Nurse Practitioner

## 2017-06-09 VITALS — BP 114/80 | HR 93 | Temp 97.9°F | Resp 18

## 2017-06-09 DIAGNOSIS — E11621 Type 2 diabetes mellitus with foot ulcer: Secondary | ICD-10-CM | POA: Diagnosis not present

## 2017-06-09 DIAGNOSIS — N184 Chronic kidney disease, stage 4 (severe): Secondary | ICD-10-CM | POA: Insufficient documentation

## 2017-06-09 DIAGNOSIS — L97512 Non-pressure chronic ulcer of other part of right foot with fat layer exposed: Secondary | ICD-10-CM | POA: Diagnosis not present

## 2017-06-09 DIAGNOSIS — I10 Essential (primary) hypertension: Secondary | ICD-10-CM | POA: Diagnosis not present

## 2017-06-09 DIAGNOSIS — E1140 Type 2 diabetes mellitus with diabetic neuropathy, unspecified: Secondary | ICD-10-CM | POA: Diagnosis not present

## 2017-06-09 DIAGNOSIS — N182 Chronic kidney disease, stage 2 (mild): Secondary | ICD-10-CM

## 2017-06-09 DIAGNOSIS — D631 Anemia in chronic kidney disease: Secondary | ICD-10-CM | POA: Insufficient documentation

## 2017-06-09 DIAGNOSIS — Z940 Kidney transplant status: Secondary | ICD-10-CM | POA: Diagnosis not present

## 2017-06-09 DIAGNOSIS — Z794 Long term (current) use of insulin: Secondary | ICD-10-CM | POA: Diagnosis not present

## 2017-06-09 LAB — POCT HEMOGLOBIN-HEMACUE: Hemoglobin: 11.4 g/dL — ABNORMAL LOW (ref 12.0–15.0)

## 2017-06-09 MED ORDER — EPOETIN ALFA 10000 UNIT/ML IJ SOLN
INTRAMUSCULAR | Status: AC
  Administered 2017-06-09: 10000 [IU]
  Filled 2017-06-09: qty 1

## 2017-06-09 MED ORDER — EPOETIN ALFA 20000 UNIT/ML IJ SOLN
INTRAMUSCULAR | Status: AC
  Administered 2017-06-09: 20000 [IU]
  Filled 2017-06-09: qty 1

## 2017-06-09 MED ORDER — EPOETIN ALFA 40000 UNIT/ML IJ SOLN: 30000.0000 [IU] | INTRAMUSCULAR | Status: DC

## 2017-06-10 ENCOUNTER — Other Ambulatory Visit
Admission: RE | Admit: 2017-06-10 | Discharge: 2017-06-10 | Disposition: A | Discharge status: Home / Self Care | Payer: Medicare Other | Source: Ambulatory Visit | Attending: *Deleted | Admitting: *Deleted

## 2017-06-10 ENCOUNTER — Encounter: Payer: Self-pay | Admitting: Physician Assistant

## 2017-06-10 ENCOUNTER — Ambulatory Visit
Admission: RE | Admit: 2017-06-10 | Discharge: 2017-06-10 | Disposition: A | Discharge status: Home / Self Care | Payer: Medicare Other | Source: Ambulatory Visit | Attending: Physician Assistant | Admitting: Physician Assistant

## 2017-06-10 DIAGNOSIS — L97509 Non-pressure chronic ulcer of other part of unspecified foot with unspecified severity: Secondary | ICD-10-CM | POA: Diagnosis not present

## 2017-06-10 DIAGNOSIS — L97512 Non-pressure chronic ulcer of other part of right foot with fat layer exposed: Secondary | ICD-10-CM

## 2017-06-10 DIAGNOSIS — R937 Abnormal findings on diagnostic imaging of other parts of musculoskeletal system: Secondary | ICD-10-CM | POA: Diagnosis not present

## 2017-06-10 DIAGNOSIS — E11621 Type 2 diabetes mellitus with foot ulcer: Secondary | ICD-10-CM | POA: Diagnosis not present

## 2017-06-10 DIAGNOSIS — Z940 Kidney transplant status: Secondary | ICD-10-CM | POA: Diagnosis not present

## 2017-06-10 DIAGNOSIS — L97519 Non-pressure chronic ulcer of other part of right foot with unspecified severity: Secondary | ICD-10-CM | POA: Diagnosis not present

## 2017-06-11 NOTE — Progress Notes (Signed)
LETONIA, STEAD ([id number]) Visit Report for 06/09/2017 Chief Complaint Document Details Patient Name: [name], [name] Date of Service: 06/09/2017 3:30 PM Medical Record Number: [id number] Patient Account Number: [id number] Date of Birth/Sex: 1985-10-10 (31 y.o. F) Treating RN: Ahmed Prima Primary Care Provider: [name] Other Clinician: Referring Provider: [name] Treating Provider/Extender: Cathie Olden in Treatment: 2 Information Obtained from: Patient Chief Complaint She is here in follow evaluation of right great toe ulcer Electronic Signature(s) Signed: 06/09/2017 4:02:52 PM By: Lawanda Cousins Entered By: Lawanda Cousins on 06/09/2017 16:02:52 [name] ([id number]) -------------------------------------------------------------------------------- Debridement Details Patient Name: [name] Date of Service: 06/09/2017 3:30 PM Medical Record Number: [id number] Patient Account Number: [id number] Date of Birth/Sex: 1985/04/18 (31 y.o. F) Treating RN: Ahmed Prima Primary Care Provider: [name] Other Clinician: Referring Provider: [name] Treating Provider/Extender: Cathie Olden in Treatment: 2 Debridement Performed for Wound #3 Right Toe Great Assessment: Performed By: Physician Lawanda Cousins, NP Debridement Type: Debridement Severity of Tissue Pre Fat layer exposed Debridement: Pre-procedure Verification/Time Yes - 15:48 Out Taken: Start Time: 15:49 Pain Control: Lidocaine 4% Topical Solution Total Area Debrided (L x W): 0.9 (cm) x 1.1 (cm) = 0.99 (cm) Tissue and other material Viable, Non-Viable, Slough, Subcutaneous, Fibrin/Exudate, Slough debrided: Level: Skin/Subcutaneous Tissue Debridement Description: Excisional Instrument: Curette Specimen: Swab, Number of Specimens Taken: 1 Bleeding: Minimum Hemostasis Achieved: Pressure End Time: 15:51 Procedural Pain: 0 Post Procedural Pain: 0 Response to Treatment:  Procedure was tolerated well Post Debridement Measurements of Total Wound Length: (cm) 0.9 Width: (cm) 1.1 Depth: (cm) 0.2 Volume: (cm) 0.156 Character of Wound/Ulcer Post Debridement: Requires Further Debridement Severity of Tissue Post Debridement: Fat layer exposed Post Procedure Diagnosis Same as Pre-procedure Electronic Signature(s) Signed: 06/09/2017 4:15:03 PM By: Alric Quan Signed: 06/09/2017 9:02:48 PM By: Lawanda Cousins Entered By: Alric Quan on 06/09/2017 15:52:26 [name] ([id number]) -------------------------------------------------------------------------------- HPI Details Patient Name: [name] Date of Service: 06/09/2017 3:30 PM Medical Record Number: [id number] Patient Account Number: [id number] Date of Birth/Sex: [birth year] (31 y.o. F) Treating RN: Ahmed Prima Primary Care Provider: [name] Other Clinician: Referring Provider: [name] Treating Provider/Extender: Cathie Olden in Treatment: 2 History of Present Illness HPI Description: 02/14/17 on evaluation today patient appears to be doing fairly well all things considered. She tells me that around the middle of November she was sleeping close to a space heater when she woke up and sustained a burn to her right first toe. She has a history of diabetes which is uncontrolled her last hemoglobin A1c with 11.5 on December 12, 2016. She is status post having had a kidney transplant and this was necessitated by apparently a high dose of antibiotics given to her as a child. Overall she has been tolerating the wound fairly well all things considered she has been putting antibiotic ointment on the area but otherwise no other treatment. She did go to the ER twice the station left without being seen due to the long wait. She continues to have discomfort rated to be 3-4/10 which is worse with toucher cleansing of the wound. 02/24/2017 -- she has uncontrolled diabetes mellitus with  hyperglycemia and her last hemoglobin A1c was 14%. I have asked her to be more careful and see her PCP regarding this. She is also not wearing bilateral compression stockings as recommended before. 03/18/17 on evaluation today patient appears to be doing very well and in fact her wound appears to be completely healed. She has been tolerating the dressing  changes without complication. Fortunately she is having no pain. *** 05/26/17 patient seen today for reevaluation concerning her right great toe ulcer. She has previously been evaluated by myself in January through the beginning of the year before subsequently being discharged. She was completely healed at that point. Unfortunately patient tells me that she's unsure of exactly what happened but this wound has reopened. Upon hearing her story it sounds as if she likely injured this utilizing a pumis stone that she uses to work on her calluses. At one point she even asked me if there was a different way that she could potentially work on all of the callous and dry skin on her foot other than utilizing the stone. With that being said her story at this point was that she felt that she may have burnt her foot specifically the great toe on a space heater that she keeps on the bed stand beside her bed. With that being said I do not think that's very likely the toes around and all the surrounding region does not appear to show any signs of thermal injury nor does this ulcer appear to really be thermal in nature. It very much looks more like a diabetic foot ulcer. Patient's most recent hemoglobin A1c was 11.2 that was in January 2019. She has not had this check since she tells me that her blood sugars run in the "300s". Otherwise not much has really changed since I saw her previously. 06/02/17- She is here in follow-up evaluation for right great toe ulcer. Plain film x-ray revealed evidence worrisome for osteomyelitis, will order MRI; we discussed these  findings. She presents to the clinic with feelings of hypoglycemia, she was provided with an orange juice in her glucose was 83; despite this being a normal glucose level am not surprised she is having hypoglycemic symptoms. She tolerated debridement. We discussed the need for tight glycemic control (her a1c has been 11 in Nov and Jan), offloading/reduced trauma and compliance with medical treatment plan. A consult for ID was placed 06/09/17-She is here in follow-up evaluation for right great toe ulcer. Her MRI is scheduled for tomorrow. The wound is significantly improved with granulation tissue encompassing most of the wound, small amount of nonviable tissue close to the nail with uneven coloration of the toenail itself, currently no lifting. She has an appointment with podiatry and endocrinology on 4/9; an appointment with Dr. Ola Spurr on 4/11. I prescribed Levaquin last week which she has not started. Due to the improvement of the wound with granulation tissue, I cultured the wound after debridement and we will hold off on initiating Levaquin. I will reach out to her nephrologist, Dr. Lorrene Reid regarding antibiotic selection. If the MRI is negative for osteomyelitis we will cancel her appointment with Dr. Ola Spurr. She states she has been more diligent in maintaining better glycemic control with more levels being below 200 then over; she has been encouraged to maintain this for continued wound healing. Electronic Signature(s) Signed: 06/09/2017 4:07:55 PM By: Lawanda Cousins Entered By: Lawanda Cousins on 06/09/2017 16:07:55 [name] ([id number]) -------------------------------------------------------------------------------- Physician Orders Details Patient Name: [name] Date of Service: 06/09/2017 3:30 PM Medical Record Number: [id number] Patient Account Number: [id number] Date of Birth/Sex: 11/16/1985 (31 y.o. F) Treating RN: Ahmed Prima Primary Care Provider: [name]  Other Clinician: Referring Provider: [name] Treating Provider/Extender: Cathie Olden in Treatment: 2 Verbal / Phone Orders: Yes Clinician: Carolyne Fiscal, Debi Read Back and Verified: Yes Diagnosis Coding Wound Cleansing Wound #3  Right Toe Great o Clean wound with Normal Saline. o Cleanse wound with mild soap and water Anesthetic (add to Medication List) Wound #3 Right Toe Great o Topical Lidocaine 4% cream applied to wound bed prior to debridement (In Clinic Only). Primary Wound Dressing Wound #3 Right Toe Great o Santyl Ointment Secondary Dressing Wound #3 Right Toe Great o Dry Gauze o Saline moistened gauze o Conform/Kerlix Dressing Change Frequency Wound #3 Right Toe Great o Change dressing every day. Follow-up Appointments Wound #3 Right Toe Great o Return Appointment in 1 week. Off-Loading Wound #3 Right Toe Great o Open toe surgical shoe to: Additional Orders / Instructions Wound #3 Right Toe Great o Increase protein intake. Patient Medications Allergies: Benadryl, Shellfish Containing Products, banana, nitrofurantoin, Infed, penicillin, doxycycline Notifications Medication Indication Start End lidocaine DOSE 1 - topical 4 % cream - 1 cream topical AMARISE, LILLO ([id number]) Electronic Signature(s) Signed: 06/09/2017 4:15:03 PM By: Alric Quan Signed: 06/09/2017 9:02:48 PM By: Lawanda Cousins Entered By: Alric Quan on 06/09/2017 15:48:02 [name] ([id number]) -------------------------------------------------------------------------------- Prescription 06/09/2017 Patient Name: [name] Provider: Lawanda Cousins NP Date of Birth: 1985/04/27 NPI#: [id number] Sex: F DEA#: [id number] Phone #: [phone] License #: Patient Address: [place], [place] [address] [address] [place], [place]  [address] [phone] Allergies Benadryl Reaction: anaphalaxis Severity: Severe Shellfish Containing Products banana nitrofurantoin Infed penicillin doxycycline Medication Medication: Route: Strength: Form: lidocaine 4 % topical cream topical 4% cream Class: TOPICAL LOCAL ANESTHETICS Dose: Frequency / Time: Indication: 1 1 cream topical Number of Refills: Number of Units: 0 Generic Substitution: Start Date: End Date: One Time Use: Substitution Permitted No Note to Pharmacy: Signature(s): Date(s): [name], [name] ([id number]) Electronic Signature(s) Signed: 06/09/2017 4:15:03 PM By: Alric Quan Signed: 06/09/2017 9:02:48 PM By: Lawanda Cousins Entered By: Alric Quan on 06/09/2017 15:48:02 [name] ([id number]) --------------------------------------------------------------------------------  Problem List Details Patient Name: [name] Date of Service: 06/09/2017 3:30 PM Medical Record Number: [id number] Patient Account Number: [id number] Date of Birth/Sex: [birth year] (31 y.o. F) Treating RN: Ahmed Prima Primary Care Provider: [name] Other Clinician: Referring Provider: [name] Treating Provider/Extender: Cathie Olden in Treatment: 2 Active Problems ICD-10 Impacting Encounter Code Description Active Date Wound Healing Diagnosis E11.621 Type 2 diabetes mellitus with foot ulcer 05/26/2017 Yes L97.512 Non-pressure chronic ulcer of other part of right foot with fat 05/26/2017 Yes layer exposed Z94.0 Kidney transplant status 05/26/2017 Yes I10 Essential (primary) hypertension 05/26/2017 Yes Inactive Problems Resolved Problems Electronic Signature(s) Signed: 06/09/2017 4:01:53 PM By: Lawanda Cousins Entered By: Lawanda Cousins on 06/09/2017 16:01:52 [name] ([id number]) -------------------------------------------------------------------------------- Progress Note Details Patient Name: [name] Date of Service:  06/09/2017 3:30 PM Medical Record Number: [id number] Patient Account Number: [id number] Date of Birth/Sex: 22-Nov-1985 (31 y.o. F) Treating RN: Ahmed Prima Primary Care Provider: [name] Other Clinician: Referring Provider: [name] Treating Provider/Extender: Cathie Olden in Treatment: 2 Subjective Chief Complaint Information obtained from Patient She is here in follow evaluation of right great toe ulcer History of Present Illness (HPI) 02/14/17 on evaluation today patient appears to be doing fairly well all things considered. She tells me that around the middle of November she was sleeping close to a space heater when she woke up and sustained a burn to her right first toe. She has a history of diabetes which is uncontrolled her last hemoglobin A1c with 11.5 on December 12, 2016. She is status post having had a kidney transplant and this was necessitated by apparently a high dose of antibiotics given to her as a child. Overall she has been tolerating the wound fairly well all things considered she has been putting antibiotic ointment on the area but otherwise no other treatment. She did go to the ER twice the station left without being seen due to the long wait. She continues to have discomfort rated to be 3-4/10 which is worse with toucher cleansing of the wound. 02/24/2017 -- she has uncontrolled diabetes mellitus with hyperglycemia and her last hemoglobin A1c was 14%. I have asked her to be more careful and see her PCP regarding this. She is also not wearing bilateral compression stockings as recommended before. 03/18/17 on evaluation today patient appears to be doing very well and in fact her wound appears to be completely healed. She has been tolerating the dressing changes without complication. Fortunately she is having no pain. *** 05/26/17 patient seen today for reevaluation concerning her right great toe ulcer. She has previously been evaluated by myself in  January through the beginning of the year before subsequently being discharged. She was completely healed at that point. Unfortunately patient tells me that she's unsure of exactly what happened but this wound has reopened. Upon hearing her story it sounds as if she likely injured this utilizing a pumis stone that she uses to work on her calluses. At one point she even asked me if there was a different way that she could potentially work on all of the callous and dry skin on her foot other than utilizing the stone. With that being said her story at this point was that she felt that she may have burnt her foot specifically the great toe on a space heater that she keeps on the bed stand beside her bed. With that being said I do not think that's very likely the toes around and all the surrounding region does not appear to show any signs of thermal injury nor does this ulcer appear to really be thermal in nature. It very much looks more like a diabetic foot ulcer. Patient's most recent hemoglobin A1c was 11.2 that was in January 2019. She has not had this check since she tells me that her blood sugars run in the "300s". Otherwise not much has really changed since I saw her previously. 06/02/17- She is here in follow-up evaluation for right great toe ulcer. Plain film x-ray revealed evidence worrisome for osteomyelitis, will order MRI; we discussed these findings. She presents to the clinic with feelings of hypoglycemia, she was provided with an orange juice in her glucose was 83; despite this being a normal glucose level am not surprised she is having hypoglycemic symptoms. She tolerated debridement. We discussed the need for tight glycemic control (her a1c has been 11 in Nov and Jan), offloading/reduced trauma and compliance with medical treatment plan. A consult for ID was placed 06/09/17-She is here in follow-up evaluation for right great toe ulcer. Her MRI is scheduled for tomorrow. The wound  is significantly improved with granulation tissue encompassing most of the wound, small amount of nonviable tissue close to the nail with uneven coloration of the toenail itself, currently no lifting. She has an appointment with podiatry and endocrinology on 4/9; an appointment with Dr. Ola Spurr on 4/11. I prescribed Levaquin last week which she has not started. Due to the improvement of the wound with granulation tissue, I cultured the wound after debridement  and we will hold off on initiating Levaquin. I will reach out to her nephrologist, Dr. Lorrene Reid regarding antibiotic selection. If the MRI is negative for osteomyelitis we will cancel her appointment with Dr. Ola Spurr. She states she has been more diligent in maintaining better glycemic control with more levels being below 200 then over; she has been encouraged to maintain this for continued wound healing. [name], [name] ([id number]) Patient History Information obtained from Patient. Family History Diabetes - Father, Heart Disease - Father, Hypertension - Mother,Father, No family history of Cancer, Hereditary Spherocytosis, Kidney Disease, Lung Disease, Seizures, Stroke, Thyroid Problems, Tuberculosis. Social History Never smoker, Marital Status - Single, Alcohol Use - Never, Drug Use - No History, Caffeine Use - Daily. Medical And Surgical History Notes Genitourinary kidney transplant 2007 Objective Constitutional Vitals Time Taken: 3:35 PM, Height: 66 in, Weight: 172 lbs, BMI: 27.8, Temperature: 98.1 F, Pulse: 91 bpm, Respiratory Rate: 18 breaths/min, Blood Pressure: 122/79 mmHg. Integumentary (Hair, Skin) Wound #3 status is Open. Original cause of wound was Not Known. The wound is located on the Right Toe Great. The wound measures 0.9cm length x 1.1cm width x 0.1cm depth; 0.778cm^2 area and 0.078cm^3 volume. There is Fat Layer (Subcutaneous Tissue) Exposed exposed. There is no tunneling or undermining noted. There is a small  amount of serous drainage noted. The wound margin is flat and intact. There is large (67-100%) red granulation within the wound bed. There is a small (1-33%) amount of necrotic tissue within the wound bed including Adherent Slough. The periwound skin appearance did not exhibit: Callus, Crepitus, Excoriation, Induration, Rash, Scarring, Dry/Scaly, Maceration, Atrophie Blanche, Cyanosis, Ecchymosis, Hemosiderin Staining, Mottled, Pallor, Rubor, Erythema. Periwound temperature was noted as No Abnormality. The periwound has tenderness on palpation. Assessment Active Problems ICD-10 E11.621 - Type 2 diabetes mellitus with foot ulcer L97.512 - Non-pressure chronic ulcer of other part of right foot with fat layer exposed Z94.0 - Kidney transplant status I10 - Essential (primary) hypertension [name], [name] ([id number]) Procedures Wound #3 Pre-procedure diagnosis of Wound #3 is a Diabetic Wound/Ulcer of the Lower Extremity located on the Right Toe Great .Severity of Tissue Pre Debridement is: Fat layer exposed. There was a Excisional Skin/Subcutaneous Tissue Debridement with a total area of 0.99 sq cm performed by Lawanda Cousins, NP. With the following instrument(s): Curette. to remove Viable and Non-Viable tissue/material Material removed includes Subcutaneous Tissue, and Slough, Fibrin/Exudate, and Plain after achieving pain control using Lidocaine 4% Topical Solution. 1 specimen was taken by a Swab and sent to the lab per facility protocol. A time out was conducted at 15:48, prior to the start of the procedure. A Minimum amount of bleeding was controlled with Pressure. The procedure was tolerated well with a pain level of 0 throughout and a pain level of 0 following the procedure. Post Debridement Measurements: 0.9cm length x 1.1cm width x 0.2cm depth; 0.156cm^3 volume. Character of Wound/Ulcer Post Debridement requires further debridement. Severity of Tissue Post Debridement is: Fat  layer exposed. Post procedure Diagnosis Wound #3: Same as Pre-Procedure Plan Wound Cleansing: Wound #3 Right Toe Great: Clean wound with Normal Saline. Cleanse wound with mild soap and water Anesthetic (add to Medication List): Wound #3 Right Toe Great: Topical Lidocaine 4% cream applied to wound bed prior to debridement (In Clinic Only). Primary Wound Dressing: Wound #3 Right Toe Great: Santyl Ointment Secondary Dressing: Wound #3 Right Toe Great: Dry Gauze Saline moistened gauze Conform/Kerlix Dressing Change Frequency: Wound #3 Right Toe Great: Change dressing every day. Follow-up  Appointments: Wound #3 Right Toe Great: Return Appointment in 1 week. Off-Loading: Wound #3 Right Toe Great: Open toe surgical shoe to: Additional Orders / Instructions: Wound #3 Right Toe Great: Increase protein intake. The following medication(s) was prescribed: lidocaine topical 4 % cream 1 1 cream topical was prescribed at facility [name], [name]. ([id number]) 1. continue with santyl daily 2. hold off on starting levaquin 3. maintain follow up appointments Electronic Signature(s) Signed: 06/09/2017 4:10:32 PM By: Lawanda Cousins Entered By: Lawanda Cousins on 06/09/2017 16:10:32 [name] ([id number]) -------------------------------------------------------------------------------- ROS/PFSH Details Patient Name: [name] Date of Service: 06/09/2017 3:30 PM Medical Record Number: [id number] Patient Account Number: [id number] Date of Birth/Sex: 14-Oct-1985 (31 y.o. F) Treating RN: Ahmed Prima Primary Care Provider: [name] Other Clinician: Referring Provider: [name] Treating Provider/Extender: Cathie Olden in Treatment: 2 Information Obtained From Patient Wound History Do you currently have one or more open woundso Yes How many open wounds do you currently haveo 1 Approximately how long have you had your woundso 3 weeks How have you been treating your  wound(s) until nowo prisma Has your wound(s) ever healed and then re-openedo No Have you had any lab work done in the past montho No Have you tested positive for an antibiotic resistant organism (MRSA, VRE)o No Have you tested positive for osteomyelitis (bone infection)o No Have you had any tests for circulation on your legso No Hematologic/Lymphatic Medical History: Positive for: Anemia Cardiovascular Medical History: Positive for: Hypertension Endocrine Medical History: Positive for: Type II Diabetes Time with diabetes: 2014 Treated with: Insulin Blood sugar tested every day: Yes Tested : 3x a day Genitourinary Medical History: Past Medical History Notes: kidney transplant 2007 Musculoskeletal Medical History: Positive for: Osteoarthritis Neurologic Medical History: Positive for: Neuropathy; Seizure Disorder - hx Oncologic MELVIE, PAGLIA ([id number]) Medical History: Negative for: Received Chemotherapy; Received Radiation Immunizations Pneumococcal Vaccine: Received Pneumococcal Vaccination: No Implantable Devices Family and Social History Cancer: No; Diabetes: Yes - Father; Heart Disease: Yes - Father; Hereditary Spherocytosis: No; Hypertension: Yes - Mother,Father; Kidney Disease: No; Lung Disease: No; Seizures: No; Stroke: No; Thyroid Problems: No; Tuberculosis: No; Never smoker; Marital Status - Single; Alcohol Use: Never; Drug Use: No History; Caffeine Use: Daily; Financial Concerns: No; Food, Clothing or Shelter Needs: No; Support System Lacking: No; Transportation Concerns: No; Advanced Directives: No; Patient does not want information on Advanced Directives; Do not resuscitate: No; Living Will: No; Medical Power of Attorney: No Physician Affirmation I have reviewed and agree with the above information. Electronic Signature(s) Signed: 06/09/2017 4:15:03 PM By: Alric Quan Signed: 06/09/2017 9:02:48 PM By: Lawanda Cousins Entered By: Lawanda Cousins on  06/09/2017 16:08:04 [name] ([id number]) -------------------------------------------------------------------------------- SuperBill Details Patient Name: [name] Date of Service: 06/09/2017 Medical Record Number: [id number] Patient Account Number: [id number] Date of Birth/Sex: 1985-05-09 (31 y.o. F) Treating RN: Ahmed Prima Primary Care Provider: [name] Other Clinician: Referring Provider: [name] Treating Provider/Extender: Cathie Olden in Treatment: 2 Diagnosis Coding ICD-10 Codes Code Description E11.621 Type 2 diabetes mellitus with foot ulcer L97.512 Non-pressure chronic ulcer of other part of right foot with fat layer exposed Z94.0 Kidney transplant status I10 Essential (primary) hypertension Facility Procedures CPT4 Code: [id number] Description: 25638 - DEB SUBQ TISSUE 20 SQ CM/< ICD-10 Diagnosis Description E11.621 Type 2 diabetes mellitus with foot ulcer L97.512 Non-pressure chronic ulcer of other part of right foot with fat Modifier: layer exposed Quantity: 1 Physician Procedures CPT4 Code: [id number] Description: 11042 - WC PHYS SUBQ TISS  20 SQ CM ICD-10 Diagnosis Description E11.621 Type 2 diabetes mellitus with foot ulcer L97.512 Non-pressure chronic ulcer of other part of right foot with fat Modifier: layer exposed Quantity: 1 Electronic Signature(s) Signed: 06/09/2017 4:10:43 PM By: Lawanda Cousins Entered By: Lawanda Cousins on 06/09/2017 16:10:42

## 2017-06-13 NOTE — Progress Notes (Signed)
[name], [name] ([id number]) Visit Report for 06/09/2017 Arrival Information Details Patient Name: [name] Date of Service: 06/09/2017 3:30 PM Medical Record Number: [id number] Patient Account Number: [id number] Date of Birth/Sex: 03/31/1985 (31 y.o. F) Treating RN: [name] Primary Care [name]: [name] Other Clinician: Referring [name]: [name] Treating [name]/Extender: [name] in Treatment: 2 Visit Information History Since Last Visit Added or deleted any medications: No Patient Arrived: Ambulatory Any new allergies or adverse reactions: No Arrival Time: 15:34 Had a fall or experienced change in No Accompanied By: self activities of daily living that may affect Transfer Assistance: None risk of falls: Patient Identification Verified: Yes Signs or symptoms of abuse/neglect since last visito No Secondary Verification Process Yes Hospitalized since last visit: No Completed: Implantable device outside of the clinic excluding No Patient Has Alerts: Yes cellular tissue based products placed in the center Patient Alerts: DMII since last visit: REFUSED FACE Has Dressing in Place as Prescribed: Yes PHOTO Pain Present Now: No ABI 02/14/17 L 1.07 R 1.11 Electronic Signature(s) Signed: 06/09/2017 4:03:23 PM By: [name] Entered By: [name] on 06/09/2017 15:34:40 [name] ([id number]) -------------------------------------------------------------------------------- Encounter Discharge Information Details Patient Name: [name] Date of Service: 06/09/2017 3:30 PM Medical Record Number: [id number] Patient Account Number: [id number] Date of Birth/Sex: [birth year] (31 y.o. F) Treating RN: [name] Primary Care [name]: [name] Other Clinician: Referring [name]: [name] Treating [name]/Extender: [name] in Treatment: 2 Encounter Discharge Information Items Schedule Follow-up  Appointment: No Medication Reconciliation completed and No provided to Patient/Care Casanova Schurman: Provided on Clinical Summary of Care: 06/09/2017 Form Type Recipient Paper Patient DC Electronic Signature(s) Signed: 06/13/2017 10:06:49 AM By: [name] Entered By: [name] on 06/09/2017 15:56:48 [name] ([id number]) -------------------------------------------------------------------------------- Lower Extremity Assessment Details Patient Name: [name] Date of Service: 06/09/2017 3:30 PM Medical Record Number: [id number] Patient Account Number: [id number] Date of Birth/Sex: [birth year] (31 y.o. F) Treating RN: [name] Primary Care Ayshia Gramlich: [name] Other Clinician: Referring Briant Angelillo: [name] Treating [name]/Extender: [name] in Treatment: 2 Vascular Assessment Pulses: Dorsalis Pedis Palpable: [Right:Yes] Posterior Tibial Extremity colors, hair growth, and conditions: Extremity Color: [Right:Normal] Hair Growth on Extremity: [Right:Yes] Temperature of Extremity: [Right:Warm] Capillary Refill: [Right:< 3 seconds] Electronic Signature(s) Signed: 06/09/2017 4:03:23 PM By: [name] Entered By: [name] on 06/09/2017 15:41:53 [name] ([id number]) -------------------------------------------------------------------------------- Multi Wound Chart Details Patient Name: [name] Date of Service: 06/09/2017 3:30 PM Medical Record Number: [id number] Patient Account Number: [id number] Date of Birth/Sex: [birth year] (31 y.o. F) Treating RN: [name] Primary Care Porcha Deblanc: [name] Other Clinician: Referring [name]: [name] Treating [name]/Extender: [name] in Treatment: 2 Vital Signs Height(in): 50 Pulse(bpm): 78 Weight(lbs): 172 Blood Pressure(mmHg): 122/79 Body Mass Index(BMI): 28 Temperature(F): 98.1 Respiratory Rate 18 (breaths/min): Photos: [N/A:N/A] Wound  Location: Right Toe Great N/A N/A Wounding Event: Not Known N/A N/A Primary Etiology: Diabetic Wound/Ulcer of the N/A N/A Lower Extremity Comorbid History: Anemia, Hypertension, Type II N/A N/A Diabetes, Osteoarthritis, Neuropathy, Seizure Disorder Date Acquired: 05/11/2017 N/A N/A Weeks of Treatment: 2 N/A N/A Wound Status: Open N/A N/A Pending Amputation on Yes N/A N/A Presentation: Measurements L x W x D 0.9x1.1x0.1 N/A N/A (cm) Area (cm) : 0.778 N/A N/A Volume (cm) : 0.078 N/A N/A % Reduction in Area: -10.00% N/A N/A % Reduction in Volume: -9.90% N/A N/A Classification: Grade 2 N/A N/A Exudate Amount: Small N/A N/A Exudate Type: Serous N/A N/A Exudate Color: amber  N/A N/A Wound Margin: Flat and Intact N/A N/A Granulation Amount: Large (67-100%) N/A N/A Granulation Quality: Red N/A N/A Necrotic Amount: Small (1-33%) N/A N/A Exposed Structures: Fat Layer (Subcutaneous N/A N/A Tissue) Exposed: Yes Fascia: No [name], [name] ([id number]) Tendon: No Muscle: No Joint: No Bone: No Epithelialization: Small (1-33%) N/A N/A Debridement: Debridement - Excisional N/A N/A Pre-procedure 15:48 N/A N/A Verification/Time Out Taken: Pain Control: Lidocaine 4% Topical Solution N/A N/A Tissue Debrided: Subcutaneous, Slough N/A N/A Level: Skin/Subcutaneous Tissue N/A N/A Debridement Area (sq cm): 0.99 N/A N/A Instrument: Curette N/A N/A Specimen: Swab N/A N/A Number of Specimens 1 N/A N/A Taken: Bleeding: Minimum N/A N/A Hemostasis Achieved: Pressure N/A N/A Procedural Pain: 0 N/A N/A Post Procedural Pain: 0 N/A N/A Debridement Treatment Procedure was tolerated well N/A N/A Response: Post Debridement 0.9x1.1x0.2 N/A N/A Measurements L x W x D (cm) Post Debridement Volume: 0.156 N/A N/A (cm) Periwound Skin Texture: Excoriation: No N/A N/A Induration: No Callus: No Crepitus: No Rash: No Scarring: No Periwound Skin Moisture: Maceration: No N/A N/A Dry/Scaly:  No Periwound Skin Color: Atrophie Blanche: No N/A N/A Cyanosis: No Ecchymosis: No Erythema: No Hemosiderin Staining: No Mottled: No Pallor: No Rubor: No Temperature: No Abnormality N/A N/A Tenderness on Palpation: Yes N/A N/A Wound Preparation: Ulcer Cleansing: N/A N/A Rinsed/Irrigated with Saline Topical Anesthetic Applied: Other: lidocaine 4% Procedures Performed: Debridement N/A N/A Treatment Notes Electronic Signature(s) Signed: 06/09/2017 4:02:02 PM By: Drema Balzarine ([id number]) Entered By: Lawanda Cousins on 06/09/2017 16:02:02 [name] ([id number]) -------------------------------------------------------------------------------- [place] Details Patient Name: [name] Date of Service: 06/09/2017 3:30 PM Medical Record Number: [id number] Patient Account Number: [id number] Date of Birth/Sex: 11-Oct-1985 (31 y.o. F) Treating RN: [name] Primary Care Reneta Niehaus: [name] Other Clinician: Referring Venesha Petraitis: [name] Treating Sondra Blixt/Extender: [name] in Treatment: 2 Active Inactive ` Nutrition Nursing Diagnoses: Imbalanced nutrition Impaired glucose control: actual or potential Potential for alteratiion in Nutrition/Potential for imbalanced nutrition Goals: Patient/caregiver will maintain therapeutic glucose control Date Initiated: 05/26/2017 Target Resolution Date: 09/17/2017 Goal Status: Active Interventions: Assess patient nutrition upon admission and as needed per policy Provide education on elevated blood sugars and impact on wound healing Notes: ` Orientation to the Wound Care Program Nursing Diagnoses: Knowledge deficit related to the wound healing center program Goals: Patient/caregiver will verbalize understanding of the [facility] Date Initiated: 05/26/2017 Target Resolution Date: 06/18/2017 Goal Status: Active Interventions: Provide education on orientation to  the wound center Notes: ` Wound/Skin Impairment Nursing Diagnoses: Impaired tissue integrity Knowledge deficit related to ulceration/compromised skin integrity Goals: Ulcer/skin breakdown will have a volume reduction of 80% by week 12 [name], [name] ([id number]) Date Initiated: 05/26/2017 Target Resolution Date: 09/17/2017 Goal Status: Active Interventions: Assess ulceration(s) every visit Notes: Electronic Signature(s) Signed: 06/09/2017 4:15:03 PM By: Alric Quan Entered By: Alric Quan on 06/09/2017 15:45:50 [name] ([id number]) -------------------------------------------------------------------------------- Pain Assessment Details Patient Name: [name] Date of Service: 06/09/2017 3:30 PM Medical Record Number: [id number] Patient Account Number: [id number] Date of Birth/Sex: 02-22-1986 (31 y.o. F) Treating RN: [name] Primary Care Malasha Kleppe: [name] Other Clinician: Referring Zamorah Ailes: [name] Treating Zadia Uhde/Extender: [name] in Treatment: 2 Active Problems Location of Pain Severity and Description of Pain Patient Has Paino No Site Locations Pain Management and Medication Current Pain Management: Notes Topical or injectable lidocaine is offered to patient for acute pain when surgical debridement is performed. If needed, Patient is instructed to use over the  counter pain medication for the following 24-48 hours after debridement. Wound care MDs do not prescribed pain medications. Patient has chronic pain or uncontrolled pain. Patient has been instructed to make an appointment with their Primary Care Physician for pain management. Electronic Signature(s) Signed: 06/09/2017 4:03:23 PM By: [name] Entered By: [name] on 06/09/2017 15:34:50 [name] ([id number]) -------------------------------------------------------------------------------- Wound Assessment Details Patient Name: [name] Date of Service: 06/09/2017 3:30 PM Medical Record Number: [id number] Patient Account Number: [id number] Date of Birth/Sex: 1985/11/07 (31 y.o. F) Treating RN: [name] Primary Care Gabor Lusk: [name] Other Clinician: Referring Sharron Simpson: [name] Treating Steffani Dionisio/Extender: [name] in Treatment: 2 Wound Status Wound Number: 3 Primary Diabetic Wound/Ulcer of the Lower Extremity Etiology: Wound Location: Right Toe Great Wound Open Wounding Event: Not Known Status: Date Acquired: 05/11/2017 Comorbid Anemia, Hypertension, Type II Diabetes, Weeks Of Treatment: 2 History: Osteoarthritis, Neuropathy, Seizure Disorder Clustered Wound: No Pending Amputation On Presentation Photos Photo Uploaded By: [name] on 06/09/2017 16:00:08 Wound Measurements Length: (cm) 0.9 Width: (cm) 1.1 Depth: (cm) 0.1 Area: (cm) 0.778 Volume: (cm) 0.078 % Reduction in Area: -10% % Reduction in Volume: -9.9% Epithelialization: Small (1-33%) Tunneling: No Undermining: No Wound Description Classification: Grade 2 Wound Margin: Flat and Intact Exudate Amount: Small Exudate Type: Serous Exudate Color: amber Foul Odor After Cleansing: No Slough/Fibrino No Wound Bed Granulation Amount: Large (67-100%) Exposed Structure Granulation Quality: Red Fascia Exposed: No Necrotic Amount: Small (1-33%) Fat Layer (Subcutaneous Tissue) Exposed: Yes Necrotic Quality: Adherent Slough Tendon Exposed: No Muscle Exposed: No Joint Exposed: No Bone Exposed: No Periwound Skin Texture [name], TOSH. ([id number]) Texture Color No Abnormalities Noted: No No Abnormalities Noted: No Callus: No Atrophie Blanche: No Crepitus: No Cyanosis: No Excoriation: No Ecchymosis: No Induration: No Erythema: No Rash: No Hemosiderin Staining: No Scarring: No Mottled: No Pallor: No Moisture Rubor: No No Abnormalities Noted: No Dry / Scaly: No Temperature / Pain Maceration:  No Temperature: No Abnormality Tenderness on Palpation: Yes Wound Preparation Ulcer Cleansing: Rinsed/Irrigated with Saline Topical Anesthetic Applied: Other: lidocaine 4%, Electronic Signature(s) Signed: 06/09/2017 4:03:23 PM By: [name] Entered By: [name] on 06/09/2017 15:40:29 [name] ([id number]) -------------------------------------------------------------------------------- Vitals Details Patient Name: [name] Date of Service: 06/09/2017 3:30 PM Medical Record Number: [id number] Patient Account Number: [id number] Date of Birth/Sex: 1985/12/03 (31 y.o. F) Treating RN: [name] Primary Care Wayman Hoard: [name] Other Clinician: Referring Christabell Loseke: [name] Treating Hisham Provence/Extender: [name] in Treatment: 2 Vital Signs Time Taken: 15:35 Temperature (F): 98.1 Height (in): 66 Pulse (bpm): 91 Weight (lbs): 172 Respiratory Rate (breaths/min): 18 Body Mass Index (BMI): 27.8 Blood Pressure (mmHg): 122/79 Reference Range: 80 - 120 mg / dl Electronic Signature(s) Signed: 06/09/2017 4:03:23 PM By: [name] Entered By: [name] on 06/09/2017 15:37:26

## 2017-06-14 LAB — AEROBIC CULTURE W GRAM STAIN (SUPERFICIAL SPECIMEN)

## 2017-06-14 LAB — AEROBIC CULTURE  (SUPERFICIAL SPECIMEN)

## 2017-06-16 ENCOUNTER — Encounter: Payer: Medicare Other | Attending: Nurse Practitioner | Admitting: Nurse Practitioner

## 2017-06-16 DIAGNOSIS — Z833 Family history of diabetes mellitus: Secondary | ICD-10-CM | POA: Diagnosis not present

## 2017-06-16 DIAGNOSIS — L97512 Non-pressure chronic ulcer of other part of right foot with fat layer exposed: Secondary | ICD-10-CM | POA: Insufficient documentation

## 2017-06-16 DIAGNOSIS — E11621 Type 2 diabetes mellitus with foot ulcer: Secondary | ICD-10-CM | POA: Insufficient documentation

## 2017-06-16 DIAGNOSIS — D649 Anemia, unspecified: Secondary | ICD-10-CM | POA: Diagnosis not present

## 2017-06-16 DIAGNOSIS — I10 Essential (primary) hypertension: Secondary | ICD-10-CM | POA: Insufficient documentation

## 2017-06-16 DIAGNOSIS — B9561 Methicillin susceptible Staphylococcus aureus infection as the cause of diseases classified elsewhere: Secondary | ICD-10-CM | POA: Diagnosis not present

## 2017-06-16 DIAGNOSIS — Z8249 Family history of ischemic heart disease and other diseases of the circulatory system: Secondary | ICD-10-CM | POA: Diagnosis not present

## 2017-06-17 NOTE — Progress Notes (Signed)
[name], [name] ([id number]) Visit Report for 06/16/2017 Chief Complaint Document Details Patient Name: [name] Date of Service: 06/16/2017 3:45 PM Medical Record Number: [id number] Patient Account Number: [id number] Date of Birth/Sex: [birth year] (32 y.o. F) Treating RN: Ahmed Prima Primary Care Provider: [name] Other Clinician: Referring Provider: [name] Treating Provider/Extender: Cathie Olden in Treatment: 3 Information Obtained from: Patient Chief Complaint She is here in follow evaluation of right great toe ulcer Electronic Signature(s) Signed: 06/16/2017 4:26:50 PM By: Lawanda Cousins Entered By: Lawanda Cousins on 06/16/2017 16:26:49 [name] ([id number]) -------------------------------------------------------------------------------- Debridement Details Patient Name: [name] Date of Service: 06/16/2017 3:45 PM Medical Record Number: [id number] Patient Account Number: [id number] Date of Birth/Sex: 09/01/1985 (32 y.o. F) Treating RN: Ahmed Prima Primary Care Provider: [name] Other Clinician: Referring Provider: [name] Treating Provider/Extender: Cathie Olden in Treatment: 3 Debridement Performed for Wound #3 Right Toe Great Assessment: Performed By: Physician Lawanda Cousins, NP Debridement Type: Debridement Severity of Tissue Pre Fat layer exposed Debridement: Pre-procedure Verification/Time Yes - 16:07 Out Taken: Start Time: 16:08 Pain Control: Lidocaine 4% Topical Solution Total Area Debrided (L x W): 0.6 (cm) x 0.9 (cm) = 0.54 (cm) Tissue and other material Viable, Non-Viable, Callus, Slough, Subcutaneous, Fibrin/Exudate, Slough debrided: Level: Skin/Subcutaneous Tissue Debridement Description: Excisional Instrument: Curette Bleeding: Minimum Hemostasis Achieved: Pressure End Time: 16:12 Procedural Pain: 0 Post Procedural Pain: 0 Response to Treatment: Procedure was tolerated well Post  Debridement Measurements of Total Wound Length: (cm) 0.6 Width: (cm) 0.9 Depth: (cm) 0.2 Volume: (cm) 0.085 Character of Wound/Ulcer Post Debridement: Requires Further Debridement Severity of Tissue Post Debridement: Fat layer exposed Post Procedure Diagnosis Same as Pre-procedure Electronic Signature(s) Signed: 06/16/2017 4:26:41 PM By: Lawanda Cousins Signed: 06/16/2017 4:32:30 PM By: Alric Quan Entered By: Lawanda Cousins on 06/16/2017 16:26:41 [name] ([id number]) -------------------------------------------------------------------------------- HPI Details Patient Name: [name] Date of Service: 06/16/2017 3:45 PM Medical Record Number: [id number] Patient Account Number: [id number] Date of Birth/Sex: 1985-09-11 (32 y.o. F) Treating RN: Ahmed Prima Primary Care Provider: [name] Other Clinician: Referring Provider: [name] Treating Provider/Extender: Cathie Olden in Treatment: 3 History of Present Illness HPI Description: 02/14/17 on evaluation today patient appears to be doing fairly well all things considered. She tells me that around the middle of November she was sleeping close to a space heater when she woke up and sustained a burn to her right first toe. She has a history of diabetes which is uncontrolled her last hemoglobin A1c with 11.5 on December 12, 2016. She is status post having had a kidney transplant and this was necessitated by apparently a high dose of antibiotics given to her as a child. Overall she has been tolerating the wound fairly well all things considered she has been putting antibiotic ointment on the area but otherwise no other treatment. She did go to the ER twice the station left without being seen due to the long wait. She continues to have discomfort rated to be 3-4/10 which is worse with toucher cleansing of the wound. 02/24/2017 -- she has uncontrolled diabetes mellitus with hyperglycemia and her last hemoglobin  A1c was 14%. I have asked her to be more careful and see her PCP regarding this. She is also not wearing bilateral compression stockings as recommended before. 03/18/17 on evaluation today patient appears to be doing very well and in fact her wound appears to be completely healed. She has been tolerating the dressing changes without complication. Fortunately she is  having no pain. *** 05/26/17 patient seen today for reevaluation concerning her right great toe ulcer. She has previously been evaluated by myself in January through the beginning of the year before subsequently being discharged. She was completely healed at that point. Unfortunately patient tells me that she's unsure of exactly what happened but this wound has reopened. Upon hearing her story it sounds as if she likely injured this utilizing a pumis stone that she uses to work on her calluses. At one point she even asked me if there was a different way that she could potentially work on all of the callous and dry skin on her foot other than utilizing the stone. With that being said her story at this point was that she felt that she may have burnt her foot specifically the great toe on a space heater that she keeps on the bed stand beside her bed. With that being said I do not think that's very likely the toes around and all the surrounding region does not appear to show any signs of thermal injury nor does this ulcer appear to really be thermal in nature. It very much looks more like a diabetic foot ulcer. Patient's most recent hemoglobin A1c was 11.2 that was in January 2019. She has not had this check since she tells me that her blood sugars run in the "300s". Otherwise not much has really changed since I saw her previously. 06/02/17- She is here in follow-up evaluation for right great toe ulcer. Plain film x-ray revealed evidence worrisome for osteomyelitis, will order MRI; we discussed these findings. She presents to the clinic with  feelings of hypoglycemia, she was provided with an orange juice in her glucose was 83; despite this being a normal glucose level am not surprised she is having hypoglycemic symptoms. She tolerated debridement. We discussed the need for tight glycemic control (her a1c has been 11 in Nov and Jan), offloading/reduced trauma and compliance with medical treatment plan. A consult for ID was placed 06/09/17-She is here in follow-up evaluation for right great toe ulcer. Her MRI is scheduled for tomorrow. The wound is significantly improved with granulation tissue encompassing most of the wound, small amount of nonviable tissue close to the nail with uneven coloration of the toenail itself, currently no lifting. She has an appointment with podiatry and endocrinology on 4/9; an appointment with Dr. Ola Spurr on 4/11. I prescribed Levaquin last week which she has not started. Due to the improvement of the wound with granulation tissue, I cultured the wound after debridement and we will hold off on initiating Levaquin. I will reach out to her nephrologist, Dr. Lorrene Reid regarding antibiotic selection. If the MRI is negative for osteomyelitis we will cancel her appointment with Dr. Ola Spurr. She states she has been more diligent in maintaining better glycemic control with more levels being below 200 then over; she has been encouraged to maintain this for continued wound healing. 06/16/17-She is here in follow up evaluation for right great toe ulcer. 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 She does have an appointment with Dr. Ola Spurr on 4/11. Culture that was obtained last week grew oxacillin sensitive staph aureus, she was initiated on the Levaquin that was originally ordered on 3/21. She admits to taking her loading dose on Monday 4/1 and starting her 250 mg daily dose on 4/2. We will extend the Levaquin 250 mg daily through her appointment with Dr. Ola Spurr. There continues to be improvement in both measurements and  appearance, we will transition from Black to  Hydrofera Blue. She states her glucose levels are consistently less than 200 with fewer times greater than 200. She will follow-up next week [name], [name] ([id number]) Electronic Signature(s) Signed: 06/16/2017 4:28:32 PM By: Lawanda Cousins Entered By: Lawanda Cousins on 06/16/2017 16:28:32 [name] ([id number]) -------------------------------------------------------------------------------- Physician Orders Details Patient Name: [name] Date of Service: 06/16/2017 3:45 PM Medical Record Number: [id number] Patient Account Number: [id number] Date of Birth/Sex: [birth year] (31 y.o. F) Treating RN: Ahmed Prima Primary Care Provider: [name] Other Clinician: Referring Provider: [name] Treating Provider/Extender: Cathie Olden in Treatment: 3 Verbal / Phone Orders: Yes Clinician: Pinkerton, Debi Read Back and Verified: Yes Diagnosis Coding Wound Cleansing Wound #3 Right Toe Great o Clean wound with Normal Saline. o Cleanse wound with mild soap and water Anesthetic (add to Medication List) Wound #3 Right Toe Great o Topical Lidocaine 4% cream applied to wound bed prior to debridement (In Clinic Only). Primary Wound Dressing Wound #3 Right Toe Great o Hydrafera Blue Ready Transfer Secondary Dressing Wound #3 Right Toe Great o Dry Gauze o Conform/Kerlix Dressing Change Frequency Wound #3 Right Toe Great o Change dressing every day. Follow-up Appointments Wound #3 Right Toe Great o Return Appointment in 1 week. Off-Loading Wound #3 Right Toe Great o Open toe surgical shoe to: Additional Orders / Instructions Wound #3 Right Toe Great o Increase protein intake. Patient Medications Allergies: Benadryl, Shellfish Containing Products, banana, nitrofurantoin, Infed, penicillin, doxycycline Notifications Medication Indication Start End lidocaine DOSE 1 - topical 4 % cream - 1 cream  topical Levaquin 06/20/2017 [name], [name] ([id number]) Notifications Medication Indication Start End DOSE oral 250 mg tablet - tablet oral once daily for 7 additional days Electronic Signature(s) Signed: 06/16/2017 4:23:48 PM By: Lawanda Cousins Entered By: Lawanda Cousins on 06/16/2017 16:23:48 [name] ([id number]) -------------------------------------------------------------------------------- Prescription 06/16/2017 Patient Name: [name] Provider: Lawanda Cousins NP Date of Birth: Jun 05, 1985 NPI#: [id number] Sex: F DEA#: [id number] Phone #: [phone] License #: Patient Address: [place], [place] [address] [address], [place], [place] [address] [phone] Allergies Benadryl Reaction: anaphalaxis Severity: Severe Shellfish Containing Products banana nitrofurantoin Infed penicillin doxycycline Medication Medication: Route: Strength: Form: lidocaine 4 % topical cream topical 4% cream Class: TOPICAL LOCAL ANESTHETICS Dose: Frequency / Time: Indication: 1 1 cream topical Number of Refills: Number of Units: 0 Generic Substitution: Start Date: End Date: One Time Use: Substitution Permitted No Note to Pharmacy: Signature(s): Date(s): [name], [name] ([id number]) Electronic Signature(s) Signed: 06/16/2017 4:47:35 PM By: Lawanda Cousins Entered By: Lawanda Cousins on 06/16/2017 16:23:49 [name] ([id number]) --------------------------------------------------------------------------------  Problem List Details Patient Name: [name] Date of Service: 06/16/2017 3:45 PM Medical Record Number: [id number] Patient Account Number: [id number] Date of Birth/Sex: May 26, 1985 (31 y.o. F) Treating RN: Ahmed Prima Primary Care Provider: [name] Other Clinician: Referring Provider: [name] Treating Provider/Extender: Cathie Olden in Treatment: 3 Active Problems ICD-10 Impacting Encounter Code Description Active Date Wound Healing Diagnosis E11.621 Type 2 diabetes mellitus with foot ulcer 05/26/2017 Yes L97.512 Non-pressure chronic ulcer of other part of right foot with fat 05/26/2017 Yes layer exposed Z94.0 Kidney transplant status 05/26/2017 Yes I10 Essential (primary) hypertension 05/26/2017 Yes Inactive Problems Resolved Problems Electronic Signature(s) Signed: 06/16/2017 4:26:11 PM By: Lawanda Cousins Entered By: Lawanda Cousins on 06/16/2017 16:26:11 [name] ([id number]) -------------------------------------------------------------------------------- Progress Note Details Patient Name: [name] Date of Service: 06/16/2017  3:45 PM Medical Record Number: [id number] Patient Account Number: [id number] Date of Birth/Sex: Jun 08, 1985 (31 y.o. F) Treating RN: Ahmed Prima Primary Care Provider: [name] Other Clinician: Referring Provider: [name] Treating Provider/Extender: Cathie Olden in Treatment: 3 Subjective Chief Complaint Information obtained from Patient She is here in follow evaluation of right great toe ulcer History of Present Illness (HPI) 02/14/17 on evaluation today patient appears to be doing fairly well all things considered. She tells me that around the middle of November she was sleeping close to a space heater when she woke up and sustained a burn to her right first toe. She has a history of diabetes which is uncontrolled her last hemoglobin A1c with 11.5 on December 12, 2016. She is status post having had a kidney transplant and this was necessitated by apparently a high dose of antibiotics given to her as a child. Overall she has been tolerating the wound fairly well all things considered she has been putting antibiotic ointment on the area but otherwise no other treatment. She did go to the ER twice the station left without being seen due to the long  wait. She continues to have discomfort rated to be 3-4/10 which is worse with toucher cleansing of the wound. 02/24/2017 -- she has uncontrolled diabetes mellitus with hyperglycemia and her last hemoglobin A1c was 14%. I have asked her to be more careful and see her PCP regarding this. She is also not wearing bilateral compression stockings as recommended before. 03/18/17 on evaluation today patient appears to be doing very well and in fact her wound appears to be completely healed. She has been tolerating the dressing changes without complication. Fortunately she is having no pain. *** 05/26/17 patient seen today for reevaluation concerning her right great toe ulcer. She has previously been evaluated by myself in January through the beginning of the year before subsequently being discharged. She was completely healed at that point. Unfortunately patient tells me that she's unsure of exactly what happened but this wound has reopened. Upon hearing her story it sounds as if she likely injured this utilizing a pumis stone that she uses to work on her calluses. At one point she even asked me if there was a different way that she could potentially work on all of the callous and dry skin on her foot other than utilizing the stone. With that being said her story at this point was that she felt that she may have burnt her foot specifically the great toe on a space heater that she keeps on the bed stand beside her bed. With that being said I do not think that's very likely the toes around and all the surrounding region does not appear to show any signs of thermal injury nor does this ulcer appear to really be thermal in nature. It very much looks more like a diabetic foot ulcer. Patient's most recent hemoglobin A1c was 11.2 that was in January 2019. She has not had this check since she tells me that her blood sugars run in the "300s". Otherwise not much has really changed since I saw her previously. 06/02/17-  She is here in follow-up evaluation for right great toe ulcer. Plain film x-ray revealed evidence worrisome for osteomyelitis, will order MRI; we discussed these findings. She presents to the clinic with feelings of hypoglycemia, she was provided with an orange juice in her glucose was 83; despite this being a normal glucose level am not surprised she is having hypoglycemic symptoms. She tolerated  debridement. We discussed the need for tight glycemic control (her a1c has been 11 in Nov and Jan), offloading/reduced trauma and compliance with medical treatment plan. A consult for ID was placed 06/09/17-She is here in follow-up evaluation for right great toe ulcer. Her MRI is scheduled for tomorrow. The wound is significantly improved with granulation tissue encompassing most of the wound, small amount of nonviable tissue close to the nail with uneven coloration of the toenail itself, currently no lifting. She has an appointment with podiatry and endocrinology on 4/9; an appointment with Dr. Ola Spurr on 4/11. I prescribed Levaquin last week which she has not started. Due to the improvement of the wound with granulation tissue, I cultured the wound after debridement and we will hold off on initiating Levaquin. I will reach out to her nephrologist, Dr. Lorrene Reid regarding antibiotic selection. If the MRI is negative for osteomyelitis we will cancel her appointment with Dr. Ola Spurr. She states she has been more diligent in maintaining better glycemic control with more levels being below 200 then over; she has been encouraged to maintain this for continued wound healing. 06/16/17-She is here in follow up evaluation for right great toe ulcer. 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 She does have an [name], [name] ([id number]) appointment with Dr. Ola Spurr on 4/11. Culture that was obtained last week grew oxacillin sensitive staph aureus, she was initiated on the Levaquin that was originally ordered on 3/21. She  admits to taking her loading dose on Monday 4/1 and starting her 250 mg daily dose on 4/2. We will extend the Levaquin 250 mg daily through her appointment with Dr. Ola Spurr. There continues to be improvement in both measurements and appearance, we will transition from [place] to [facility]. She states her glucose levels are consistently less than 200 with fewer times greater than 200. She will follow-up next week Patient History Information obtained from Patient. Family History Diabetes - Father, Heart Disease - Father, Hypertension - Mother,Father, No family history of Cancer, Hereditary Spherocytosis, Kidney Disease, Lung Disease, Seizures, Stroke, Thyroid Problems, Tuberculosis. Social History Never smoker, Marital Status - Single, Alcohol Use - Never, Drug Use - No History, Caffeine Use - Daily. Medical And Surgical History Notes Genitourinary kidney transplant 2007 Objective Constitutional Vitals Time Taken: 3:57 PM, Height: 66 in, Weight: 172 lbs, BMI: 27.8, Temperature: 98.3 F, Pulse: 98 bpm, Respiratory Rate: 18 breaths/min, Blood Pressure: 131/95 mmHg. Integumentary (Hair, Skin) Wound #3 status is Open. Original cause of wound was Not Known. The wound is located on the Right Toe Great. The wound measures 0.6cm length x 0.9cm width x 0.1cm depth; 0.424cm^2 area and 0.042cm^3 volume. There is Fat Layer (Subcutaneous Tissue) Exposed exposed. There is no tunneling or undermining noted. There is a small amount of serous drainage noted. The wound margin is flat and intact. There is large (67-100%) red granulation within the wound bed. There is a small (1-33%) amount of necrotic tissue within the wound bed including Adherent Slough. The periwound skin appearance did not exhibit: Callus, Crepitus, Excoriation, Induration, Rash, Scarring, Dry/Scaly, Maceration, Atrophie Blanche, Cyanosis, Ecchymosis, Hemosiderin Staining, Mottled, Pallor, Rubor, Erythema. Periwound temperature was  noted as No Abnormality. The periwound has tenderness on palpation. Assessment Active Problems ICD-10 E11.621 - Type 2 diabetes mellitus with foot ulcer [name], [name]. ([id number]) L97.512 - Non-pressure chronic ulcer of other part of right foot with fat layer exposed Z94.0 - Kidney transplant status I10 - Essential (primary) hypertension Procedures Wound #3 Pre-procedure diagnosis of Wound #3 is a Diabetic Wound/Ulcer of  the Lower Extremity located on the Right Toe Great .Severity of Tissue Pre Debridement is: Fat layer exposed. There was a Excisional Skin/Subcutaneous Tissue Debridement with a total area of 0.54 sq cm performed by Lawanda Cousins, NP. With the following instrument(s): Curette. to remove Viable and Non-Viable tissue/material Material removed includes Callus, Subcutaneous Tissue, and Slough, Fibrin/Exudate, and [place] after achieving pain control using Lidocaine 4% Topical Solution. No specimens were taken. A time out was conducted at 16:07, prior to the start of the procedure. A Minimum amount of bleeding was controlled with Pressure. The procedure was tolerated well with a pain level of 0 throughout and a pain level of 0 following the procedure. Post Debridement Measurements: 0.6cm length x 0.9cm width x 0.2cm depth; 0.085cm^3 volume. Character of Wound/Ulcer Post Debridement requires further debridement. Severity of Tissue Post Debridement is: Fat layer exposed. Post procedure Diagnosis Wound #3: Same as Pre-Procedure Plan Wound Cleansing: Wound #3 Right Toe Great: Clean wound with Normal Saline. Cleanse wound with mild soap and water Anesthetic (add to Medication List): Wound #3 Right Toe Great: Topical Lidocaine 4% cream applied to wound bed prior to debridement (In Clinic Only). Primary Wound Dressing: Wound #3 Right Toe Great: Hydrafera Blue Ready Transfer Secondary Dressing: Wound #3 Right Toe Great: Dry Gauze Conform/Kerlix Dressing Change  Frequency: Wound #3 Right Toe Great: Change dressing every day. Follow-up Appointments: Wound #3 Right Toe Great: Return Appointment in 1 week. Off-Loading: Wound #3 Right Toe Great: Open toe surgical shoe to: Additional Orders / Instructions: Wound #3 Right Toe Great: Increase protein intake. [name], [name] ([id number]) The following medication(s) was prescribed: lidocaine topical 4 % cream 1 1 cream topical was prescribed at facility Levaquin oral 250 mg tablet tablet oral once daily for 7 additional days starting 06/20/2017 1. hydrofera blue 2. continue with levaquin 3. maintain appointment with ID, endocrinology 4. follow up next week Electronic Signature(s) Signed: 06/16/2017 4:30:13 PM By: Lawanda Cousins Entered By: Lawanda Cousins on 06/16/2017 16:30:13 [name] ([id number]) -------------------------------------------------------------------------------- ROS/PFSH Details Patient Name: [name] Date of Service: 06/16/2017 3:45 PM Medical Record Number: [id number] Patient Account Number: [id number] Date of Birth/Sex: 01-14-1986 (31 y.o. F) Treating RN: Ahmed Prima Primary Care Provider: [name] Other Clinician: Referring Provider: [name] Treating Provider/Extender: Cathie Olden in Treatment: 3 Information Obtained From Patient Wound History Do you currently have one or more open woundso Yes How many open wounds do you currently haveo 1 Approximately how long have you had your woundso 3 weeks How have you been treating your wound(s) until nowo prisma Has your wound(s) ever healed and then re-openedo No Have you had any lab work done in the past montho No Have you tested positive for an antibiotic resistant organism (MRSA, VRE)o No Have you tested positive for osteomyelitis (bone infection)o No Have you had any tests for circulation on your legso No Hematologic/Lymphatic Medical History: Positive for: Anemia Cardiovascular Medical  History: Positive for: Hypertension Endocrine Medical History: Positive for: Type II Diabetes Time with diabetes: 2014 Treated with: Insulin Blood sugar tested every day: Yes Tested : 3x a day Genitourinary Medical History: Past Medical History Notes: kidney transplant 2007 Musculoskeletal Medical History: Positive for: Osteoarthritis Neurologic Medical History: Positive for: Neuropathy; Seizure Disorder - hx Oncologic [name], [name] ([id number]) Medical History: Negative for: Received Chemotherapy; Received Radiation Immunizations Pneumococcal Vaccine: Received Pneumococcal Vaccination: No Implantable Devices Family and Social History Cancer: No; Diabetes: Yes - Father; Heart Disease: Yes - Father; Hereditary Spherocytosis: No; Hypertension:  Yes - Mother,Father; Kidney Disease: No; Lung Disease: No; Seizures: No; Stroke: No; Thyroid Problems: No; Tuberculosis: No; Never smoker; Marital Status - Single; Alcohol Use: Never; Drug Use: No History; Caffeine Use: Daily; Financial Concerns: No; Food, Clothing or Shelter Needs: No; Support System Lacking: No; Transportation Concerns: No; Advanced Directives: No; Patient does not want information on Advanced Directives; Do not resuscitate: No; Living Will: No; Medical Power of Attorney: No Physician Affirmation I have reviewed and agree with the above information. Electronic Signature(s) Signed: 06/16/2017 4:32:30 PM By: Alric Quan Signed: 06/16/2017 4:47:35 PM By: Lawanda Cousins Entered By: Lawanda Cousins on 06/16/2017 16:28:46 [name] ([id number]) -------------------------------------------------------------------------------- SuperBill Details Patient Name: [name] Date of Service: 06/16/2017 Medical Record Number: [id number] Patient Account Number: [id number] Date of Birth/Sex: 04-Oct-1985 (31 y.o. 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

## 2017-06-18 NOTE — Progress Notes (Signed)
[name], [name] ([id number]) Visit Report for 06/16/2017 Arrival Information Details Patient Name: [name], [name] Date of Service: 06/16/2017 3:45 PM Medical Record Number: [id number] Patient Account Number: [id number] Date of Birth/Sex: 1985-11-04 (31 y.o. F) Treating RN: Montey Hora Primary Care Ekta Dancer: [name] Other Clinician: Referring Shreeya Recendiz: [name] Treating Jody Silas/Extender: Cathie Olden in Treatment: 3 Visit Information History Since Last Visit Added or deleted any medications: No Patient Arrived: Ambulatory Any new allergies or adverse reactions: No Arrival Time: 15:55 Had a fall or experienced change in No Accompanied By: self activities of daily living that may affect Transfer Assistance: None risk of falls: Patient Identification Verified: Yes Signs or symptoms of abuse/neglect since last visito No Secondary Verification Process Yes Hospitalized since last visit: No Completed: Implantable device outside of the clinic excluding No Patient Has Alerts: Yes cellular tissue based products placed in the center Patient Alerts: DMII since last visit: REFUSED FACE Has Dressing in Place as Prescribed: Yes PHOTO Pain Present Now: No ABI 02/14/17 L 1.07 R 1.11 Electronic Signature(s) Signed: 06/16/2017 4:24:22 PM By: Montey Hora Entered By: Montey Hora on 06/16/2017 15:57:13 [name] ([id number]) -------------------------------------------------------------------------------- Encounter Discharge Information Details Patient Name: [name] Date of Service: 06/16/2017 3:45 PM Medical Record Number: [id number] Patient Account Number: [id number] Date of Birth/Sex: Jul 18, 1985 (31 y.o. F) Treating RN: Ahmed Prima Primary Care Nyzaiah Kai: [name] Other Clinician: Referring Izear Pine: [name] Treating Shelbi Vaccaro/Extender: Cathie Olden in Treatment: 3 Encounter Discharge Information Items Discharge Pain Level:  0 Discharge Condition: Stable Ambulatory Status: Ambulatory Discharge Destination: Home Transportation: Private Auto Schedule Follow-up Appointment: Yes Medication Reconciliation completed and No provided to Patient/Care Shayma Pfefferle: Provided on Clinical Summary of Care: 06/16/2017 Form Type Recipient Paper Patient DC Electronic Signature(s) Signed: 06/17/2017 10:00:44 AM By: Roger Shelter Entered By: Roger Shelter on 06/16/2017 16:23:35 [name] ([id number]) -------------------------------------------------------------------------------- Lower Extremity Assessment Details Patient Name: [name] Date of Service: 06/16/2017 3:45 PM Medical Record Number: [id number] Patient Account Number: [id number] Date of Birth/Sex: 1985-11-02 (31 y.o. F) Treating RN: Montey Hora Primary Care Guiselle Mian: [name] Other Clinician: Referring Chiquitta Matty: [name] Treating Azarius Lambson/Extender: Cathie Olden in Treatment: 3 Vascular Assessment Pulses: Dorsalis Pedis Palpable: [Right:Yes] Posterior Tibial Extremity colors, hair growth, and conditions: Extremity Color: [Right:Normal] Hair Growth on Extremity: [Right:Yes] Temperature of Extremity: [Right:Warm] Capillary Refill: [Right:< 3 seconds] Electronic Signature(s) Signed: 06/16/2017 4:24:22 PM By: Montey Hora Entered By: Montey Hora on 06/16/2017 16:03:03 [name] ([id number]) -------------------------------------------------------------------------------- Multi Wound Chart Details Patient Name: [name] Date of Service: 06/16/2017 3:45 PM Medical Record Number: [id number] Patient Account Number: [id number] Date of Birth/Sex: March 08, 1986 (31 y.o. F) Treating RN: Ahmed Prima Primary Care Merric Yost: [name] Other Clinician: Referring Damontay Alred: [name] Treating Lakoda Raske/Extender: Cathie Olden in Treatment: 3 Vital Signs Height(in): 66 Pulse(bpm): 98 Weight(lbs):  172 Blood Pressure(mmHg): 131/95 Body Mass Index(BMI): 28 Temperature(F): 98.3 Respiratory Rate 18 (breaths/min): Photos: [N/A:N/A] Wound Location: Right Toe Great N/A N/A Wounding Event: Not Known N/A N/A Primary Etiology: Diabetic Wound/Ulcer of the N/A N/A Lower Extremity Comorbid History: Anemia, Hypertension, Type II N/A N/A Diabetes, Osteoarthritis, Neuropathy, Seizure Disorder Date Acquired: 05/11/2017 N/A N/A Weeks of Treatment: 3 N/A N/A Wound Status: Open N/A N/A Pending Amputation on Yes N/A N/A Presentation: Measurements L x W x D 0.6x0.9x0.1 N/A N/A (cm) Area (cm) : 0.424 N/A N/A Volume (cm) : 0.042 N/A N/A % Reduction in Area: 40.00% N/A N/A % Reduction in Volume: 40.80% N/A N/A Classification: Grade  2 N/A N/A Exudate Amount: Small N/A N/A Exudate Type: Serous N/A N/A Exudate Color: amber N/A N/A Wound Margin: Flat and Intact N/A N/A Granulation Amount: Large (67-100%) N/A N/A Granulation Quality: Red N/A N/A Necrotic Amount: Small (1-33%) N/A N/A Exposed Structures: Fat Layer (Subcutaneous N/A N/A Tissue) Exposed: Yes Fascia: No [name], [name] ([id number]) Tendon: No Muscle: No Joint: No Bone: No Epithelialization: Small (1-33%) N/A N/A Debridement: Debridement - Excisional N/A N/A Pre-procedure 16:07 N/A N/A Verification/Time Out Taken: Pain Control: Lidocaine 4% Topical Solution N/A N/A Tissue Debrided: Callus, Subcutaneous, Slough N/A N/A Level: Skin/Subcutaneous Tissue N/A N/A Debridement Area (sq cm): 0.54 N/A N/A Instrument: Curette N/A N/A Bleeding: Minimum N/A N/A Hemostasis Achieved: Pressure N/A N/A Procedural Pain: 0 N/A N/A Post Procedural Pain: 0 N/A N/A Debridement Treatment Procedure was tolerated well N/A N/A Response: Post Debridement 0.6x0.9x0.2 N/A N/A Measurements L x W x D (cm) Post Debridement Volume: 0.085 N/A N/A (cm) Periwound Skin Texture: Excoriation: No N/A N/A Induration: No Callus: No Crepitus:  No Rash: No Scarring: No Periwound Skin Moisture: Maceration: No N/A N/A Dry/Scaly: No Periwound Skin Color: Atrophie Blanche: No N/A N/A Cyanosis: No Ecchymosis: No Erythema: No Hemosiderin Staining: No Mottled: No Pallor: No Rubor: No Temperature: No Abnormality N/A N/A Tenderness on Palpation: Yes N/A N/A Wound Preparation: Ulcer Cleansing: N/A N/A Rinsed/Irrigated with Saline Topical Anesthetic Applied: Other: lidocaine 4% Procedures Performed: Debridement N/A N/A Treatment Notes Wound #3 (Right Toe Great) 1. Cleansed with: Clean wound with Normal Saline 2. Anesthetic Topical Lidocaine 4% cream to wound bed prior to debridement 4. Dressing Applied: [address] [name], [name]. ([id number]) 5. Secondary Dressing Applied Dry Gauze Kerlix/Conform Electronic Signature(s) Signed: 06/16/2017 4:26:23 PM By: Lawanda Cousins Entered By: Lawanda Cousins on 06/16/2017 16:26:23 [name] ([id number]) -------------------------------------------------------------------------------- [place] Details Patient Name: [name] Date of Service: 06/16/2017 3:45 PM Medical Record Number: [id number] Patient Account Number: [id number] Date of Birth/Sex: 11-16-1985 (31 y.o. F) Treating RN: Ahmed Prima Primary Care Lilya Smitherman: [name] Other Clinician: Referring Trinaty Bundrick: [name] Treating Clanton Emanuelson/Extender: Cathie Olden in Treatment: 3 Active Inactive ` Nutrition Nursing Diagnoses: Imbalanced nutrition Impaired glucose control: actual or potential Potential for alteratiion in Nutrition/Potential for imbalanced nutrition Goals: Patient/caregiver will maintain therapeutic glucose control Date Initiated: 05/26/2017 Target Resolution Date: 09/17/2017 Goal Status: Active Interventions: Assess patient nutrition upon admission and as needed per policy Provide education on elevated blood sugars and impact on wound  healing Notes: ` Orientation to the Wound Care Program Nursing Diagnoses: Knowledge deficit related to the wound healing center program Goals: Patient/caregiver will verbalize understanding of the [place] Date Initiated: 05/26/2017 Target Resolution Date: 06/18/2017 Goal Status: Active Interventions: Provide education on orientation to the wound center Notes: ` Wound/Skin Impairment Nursing Diagnoses: Impaired tissue integrity Knowledge deficit related to ulceration/compromised skin integrity Goals: Ulcer/skin breakdown will have a volume reduction of 80% by week 12 [name], [name] ([id number]) Date Initiated: 05/26/2017 Target Resolution Date: 09/17/2017 Goal Status: Active Interventions: Assess ulceration(s) every visit Notes: Electronic Signature(s) Signed: 06/16/2017 4:32:30 PM By: Alric Quan Entered By: Alric Quan on 06/16/2017 16:08:57 [name] ([id number]) -------------------------------------------------------------------------------- Pain Assessment Details Patient Name: [name] Date of Service: 06/16/2017 3:45 PM Medical Record Number: [id number] Patient Account Number: [id number] Date of Birth/Sex: 1986-01-01 (31 y.o. F) Treating RN: Montey Hora Primary Care Kristen Fromm: [name] Other Clinician: Referring Aubryanna Nesheim: [name] Treating Shatora Weatherbee/Extender: Cathie Olden in Treatment: 3 Active Problems Location of Pain Severity  and Description of Pain Patient Has Paino No Site Locations Pain Management and Medication Current Pain Management: Electronic Signature(s) Signed: 06/16/2017 4:24:22 PM By: Montey Hora Entered By: Montey Hora on 06/16/2017 15:57:37 [name] ([id number]) -------------------------------------------------------------------------------- Patient/Caregiver Education Details Patient Name: [name] Date of Service: 06/16/2017 3:45 PM Medical Record Number:  [id number] Patient Account Number: [id number] Date of Birth/Gender: 06/11/1985 (31 y.o. F) Treating RN: Roger Shelter Primary Care Physician: [name] Other Clinician: Referring Physician: [name] Treating Physician/Extender: Cathie Olden in Treatment: 3 Education Assessment Education Provided To: Patient Education Topics Provided Wound Debridement: Handouts: Wound Debridement Methods: Explain/Verbal Responses: State content correctly Wound/Skin Impairment: Handouts: Caring for Your Ulcer Methods: Explain/Verbal Responses: State content correctly Electronic Signature(s) Signed: 06/17/2017 10:00:44 AM By: Roger Shelter Entered By: Roger Shelter on 06/16/2017 16:23:55 [name] ([id number]) -------------------------------------------------------------------------------- Wound Assessment Details Patient Name: [name] Date of Service: 06/16/2017 3:45 PM Medical Record Number: [id number] Patient Account Number: [id number] Date of Birth/Sex: 02/02/1986 (31 y.o. F) Treating RN: Montey Hora Primary Care Sajan Cheatwood: [name] Other Clinician: Referring Adonis Yim: [name] Treating Calistro Rauf/Extender: Cathie Olden in Treatment: 3 Wound Status Wound Number: 3 Primary Diabetic Wound/Ulcer of the Lower Extremity Etiology: Wound Location: Right Toe Great Wound Open Wounding Event: Not Known Status: Date Acquired: 05/11/2017 Comorbid Anemia, Hypertension, Type II Diabetes, Weeks Of Treatment: 3 History: Osteoarthritis, Neuropathy, Seizure Disorder Clustered Wound: No Pending Amputation On Presentation Photos Photo Uploaded By: Montey Hora on 06/16/2017 16:10:09 Wound Measurements Length: (cm) 0.6 Width: (cm) 0.9 Depth: (cm) 0.1 Area: (cm) 0.424 Volume: (cm) 0.042 % Reduction in Area: 40% % Reduction in Volume: 40.8% Epithelialization: Small (1-33%) Tunneling: No Undermining: No Wound Description Classification:  Grade 2 Wound Margin: Flat and Intact Exudate Amount: Small Exudate Type: Serous Exudate Color: amber Foul Odor After Cleansing: No Slough/Fibrino No Wound Bed Granulation Amount: Large (67-100%) Exposed Structure Granulation Quality: Red Fascia Exposed: No Necrotic Amount: Small (1-33%) Fat Layer (Subcutaneous Tissue) Exposed: Yes Necrotic Quality: Adherent Slough Tendon Exposed: No Muscle Exposed: No Joint Exposed: No Bone Exposed: No Periwound Skin Texture [name], [name]. ([id number]) Texture Color No Abnormalities Noted: No No Abnormalities Noted: No Callus: No Atrophie Blanche: No Crepitus: No Cyanosis: No Excoriation: No Ecchymosis: No Induration: No Erythema: No Rash: No Hemosiderin Staining: No Scarring: No Mottled: No Pallor: No Moisture Rubor: No No Abnormalities Noted: No Dry / Scaly: No Temperature / Pain Maceration: No Temperature: No Abnormality Tenderness on Palpation: Yes Wound Preparation Ulcer Cleansing: Rinsed/Irrigated with Saline Topical Anesthetic Applied: Other: lidocaine 4%, Treatment Notes Wound #3 (Right Toe Great) 1. Cleansed with: Clean wound with Normal Saline 2. Anesthetic Topical Lidocaine 4% cream to wound bed prior to debridement 4. Dressing Applied: Hydrafera Blue 5. Secondary Dressing Applied Dry Gauze Kerlix/Conform Electronic Signature(s) Signed: 06/16/2017 4:24:22 PM By: Montey Hora Entered By: Montey Hora on 06/16/2017 16:02:46 [name] ([id number]) -------------------------------------------------------------------------------- Vitals Details Patient Name: [name] Date of Service: 06/16/2017 3:45 PM Medical Record Number: [id number] Patient Account Number: [id number] Date of Birth/Sex: 11/22/1985 (31 y.o. F) Treating RN: Montey Hora Primary Care Nandika Stetzer: [name] Other Clinician: Referring Ryon Layton: [name] Treating Lorana Maffeo/Extender: Cathie Olden in Treatment: 3 Vital  Signs Time Taken: 15:57 Temperature (F): 98.3 Height (in): 66 Pulse (bpm): 98 Weight (lbs): 172 Respiratory Rate (breaths/min): 18 Body Mass Index (BMI): 27.8 Blood Pressure (mmHg): 131/95 Reference Range: 80 - 120 mg / dl Electronic Signature(s) Signed: 06/16/2017 4:24:22 PM By: Montey Hora Entered ByMarjory Lies,  Joanna on 06/16/2017 15:59:51

## 2017-06-21 ENCOUNTER — Ambulatory Visit (INDEPENDENT_AMBULATORY_CARE_PROVIDER_SITE_OTHER): Payer: Medicare Other

## 2017-06-21 ENCOUNTER — Ambulatory Visit (INDEPENDENT_AMBULATORY_CARE_PROVIDER_SITE_OTHER): Payer: Medicare Other | Admitting: Sports Medicine

## 2017-06-21 ENCOUNTER — Other Ambulatory Visit: Payer: Self-pay | Admitting: Sports Medicine

## 2017-06-21 ENCOUNTER — Encounter: Payer: Self-pay | Admitting: Sports Medicine

## 2017-06-21 ENCOUNTER — Telehealth: Payer: Self-pay | Admitting: *Deleted

## 2017-06-21 DIAGNOSIS — M79675 Pain in left toe(s): Secondary | ICD-10-CM

## 2017-06-21 DIAGNOSIS — IMO0002 Reserved for concepts with insufficient information to code with codable children: Secondary | ICD-10-CM

## 2017-06-21 DIAGNOSIS — M2011 Hallux valgus (acquired), right foot: Secondary | ICD-10-CM | POA: Diagnosis not present

## 2017-06-21 DIAGNOSIS — E108 Type 1 diabetes mellitus with unspecified complications: Secondary | ICD-10-CM | POA: Diagnosis not present

## 2017-06-21 DIAGNOSIS — M79671 Pain in right foot: Secondary | ICD-10-CM

## 2017-06-21 DIAGNOSIS — L608 Other nail disorders: Secondary | ICD-10-CM | POA: Diagnosis not present

## 2017-06-21 DIAGNOSIS — M2012 Hallux valgus (acquired), left foot: Secondary | ICD-10-CM

## 2017-06-21 DIAGNOSIS — B351 Tinea unguium: Secondary | ICD-10-CM

## 2017-06-21 DIAGNOSIS — M79674 Pain in right toe(s): Secondary | ICD-10-CM | POA: Diagnosis not present

## 2017-06-21 DIAGNOSIS — E1065 Type 1 diabetes mellitus with hyperglycemia: Secondary | ICD-10-CM

## 2017-06-21 DIAGNOSIS — M79672 Pain in left foot: Secondary | ICD-10-CM

## 2017-06-21 DIAGNOSIS — L603 Nail dystrophy: Secondary | ICD-10-CM | POA: Diagnosis not present

## 2017-06-21 DIAGNOSIS — I739 Peripheral vascular disease, unspecified: Secondary | ICD-10-CM

## 2017-06-21 NOTE — Telephone Encounter (Signed)
-----   Message from Landis Martins, [place] sent at 06/21/2017  2:08 PM EDT ----- Regarding: ABI bil ABIs history of wound on right great toe being treated by wound care center

## 2017-06-21 NOTE — Progress Notes (Signed)
Subjective: [name] is a 32 y.o. female patient with history of diabetes who presents to office today complaining of long,mildly painful nails  while ambulating in shoes; unable to trim. Patient states that the glucose reading this morning was 108 mg/dl. Patient admits to pain at bunion on left and 2nd toe joint on left and on sides of both feet. Patient also is going to wound care center for right great toe. Patient denies nausea, vomiting, fever, chills, night sweats or any acute symptoms.  A1c 11   Review of Systems  Musculoskeletal: Positive for joint pain.  All other systems reviewed and are negative.    Patient Active Problem List   Diagnosis Date Noted  . Type 1 diabetes mellitus with complication, uncontrolled ([place]) 05/25/2015  . Intractable nausea and vomiting 01/09/2015  . Renal transplant recipient   . Immunosuppressed status ([place])   . 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 CHRONIC KIDNEY DISEASE STAGE II ([facility]) 01/04/2010  . OVARIAN FAILURE, PREMATURE 03/11/2009  . 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 SECONDARY HYPERPARATHYROIDISM 12/05/2007  . OBESITY 09/25/2007  . Anemia in chronic renal disease 09/25/2007  . LEARNING DISABILITY 09/25/2007  . Essential hypertension, benign 09/25/2007  . KIDNEY TRANSPLANTATION, HX OF 09/25/2007   Current Outpatient Medications on File Prior to Visit  Medication Sig Dispense Refill  . ACCU-CHEK SOFTCLIX LANCETS lancets Use to check blood sugar 4 times per day. 150 each 5  . calcium carbonate (TUMS) 500 MG chewable tablet Chew 2 tablets by mouth 3 (three) times daily as needed for indigestion. Takes on Monday, wednesdays, and fridays    . cetirizine (ZYRTEC) 10 MG tablet Take 10 mg by mouth daily.    . cyclobenzaprine (FLEXERIL) 10 MG tablet Take 1 tablet (10 mg total) by mouth 2 (two) times daily as needed for muscle spasms. 20 tablet 0  . diazepam (VALIUM) 5 MG tablet Take 1 tablet (5 mg total) by mouth every  6 (six) hours as needed for anxiety (spasms). 6 tablet 0  . dicyclomine (BENTYL) 20 MG tablet Take 1 tablet (20 mg total) by mouth 2 (two) times daily. 20 tablet 0  . famotidine (PEPCID) 40 MG tablet Take 1 tablet (40 mg total) by mouth daily. 30 tablet 0  . fluticasone (FLONASE) 50 MCG/ACT nasal spray Place 2 sprays into both nostrils daily. 9.9 g 2  . gabapentin (NEURONTIN) 100 MG capsule Take 100 mg by mouth daily as needed (for feet).     Marland Kitchen HYDROcodone-acetaminophen (NORCO) 5-325 MG tablet Take 1 tablet by mouth every 4 (four) hours as needed. 5 tablet 0  . hydrOXYzine (ATARAX) 25 MG tablet Take 25 mg by mouth 2 (two) times daily as needed for itching.     . insulin aspart (NOVOLOG FLEXPEN) 100 UNIT/ML FlexPen Inject 15 Units into the skin 3 (three) times daily with meals.    . insulin degludec (TRESIBA FLEXTOUCH) 100 UNIT/ML SOPN FlexTouch Pen Inject 1.3 mLs (130 Units total) into the skin daily. And pen needles 1/day 15 pen 11  . lisinopril-hydrochlorothiazide (PRINZIDE,ZESTORETIC) 20-25 MG tablet Take 1 tablet by mouth daily.    Marland Kitchen loperamide (IMODIUM) 2 MG capsule Take 2 mg by mouth as needed for diarrhea or loose stools. Take 2 tablets as needed for diarrhea.    . mycophenolate (MYFORTIC) 360 MG TBEC Take 720 mg by mouth 2 (two) times daily.     . ondansetron (ZOFRAN) 4 MG tablet Take 1 tablet (4 mg total) by mouth every  8 (eight) hours as needed for nausea or vomiting. 12 tablet 0  . predniSONE (DELTASONE) 5 MG tablet Take 5 mg by mouth at bedtime.     . prochlorperazine (COMPAZINE) 10 MG tablet Take 1 tablet (10 mg total) by mouth 2 (two) times daily as needed for nausea or vomiting. 10 tablet 0  . simvastatin (ZOCOR) 20 MG tablet Take 20 mg by mouth 3 (three) times a week. MWF.    Marland Kitchen tacrolimus (PROGRAF) 1 MG capsule Take 2 mg by mouth 2 (two) times daily.      No current facility-administered medications on file prior to visit.    Allergies  Allergen Reactions  . Benadryl  [Diphenhydramine-Zinc Acetate] Shortness Of Breath  . Motrin [Ibuprofen] Shortness Of Breath and Itching    Per pt  . Banana Other (See Comments)    Sick on the stomach  . Diphenhydramine Hcl     REACTION: Stopped breathing in ICU  . Doxycycline     Shortness of Breath   . Iron Dextran     REACTION: vein irritation  . Shellfish Allergy Hives    Recent Results (from the past 2160 hour(s))  Hemoglobin-hemacue, POC     Status: Abnormal   Collection Time: 03/31/17 11:11 AM  Result Value Ref Range   Hemoglobin 10.6 (L) 12.0 - 15.0 g/dL  Hemoglobin-hemacue, POC     Status: Abnormal   Collection Time: 04/14/17 12:56 PM  Result Value Ref Range   Hemoglobin 9.3 (L) 12.0 - 15.0 g/dL  Hemoglobin-hemacue, POC     Status: Abnormal   Collection Time: 04/28/17  1:14 PM  Result Value Ref Range   Hemoglobin 10.2 (L) 12.0 - 15.0 g/dL  Hemoglobin-hemacue, POC     Status: Abnormal   Collection Time: 05/12/17  1:20 PM  Result Value Ref Range   Hemoglobin 11.3 (L) 12.0 - 15.0 g/dL  Hemoglobin-hemacue, POC     Status: Abnormal   Collection Time: 05/25/17  1:23 PM  Result Value Ref Range   Hemoglobin 10.3 (L) 12.0 - 15.0 g/dL  Glucose, capillary     Status: None   Collection Time: 06/02/17  3:23 PM  Result Value Ref Range   Glucose-Capillary 83 65 - 99 mg/dL  Hemoglobin-hemacue, POC     Status: Abnormal   Collection Time: 06/09/17  1:25 PM  Result Value Ref Range   Hemoglobin 11.4 (L) 12.0 - 15.0 g/dL  Aerobic Culture (superficial specimen)     Status: None   Collection Time: 06/09/17  3:49 PM  Result Value Ref Range   Specimen Description      TOE RIGHT GREAT Performed at [facility], [address] [place], [place] [address]    Special Requests      NONE Performed at [facility], [place], [place] [address]    Gram Stain      WBC PRESENT,BOTH PMN AND MONONUCLEAR MODERATE GRAM POSITIVE COCCI IN CHAINS Performed at [facility], [place] [address] [place], [place] [address]    Culture FEW STAPHYLOCOCCUS AUREUS    Report Status 06/14/2017 FINAL    Organism ID, Bacteria STAPHYLOCOCCUS AUREUS       Susceptibility   Staphylococcus aureus - MIC*    CIPROFLOXACIN <=0.5 SENSITIVE Sensitive     ERYTHROMYCIN <=0.25 SENSITIVE Sensitive     GENTAMICIN <=0.5 SENSITIVE Sensitive     OXACILLIN 0.5 SENSITIVE Sensitive     TETRACYCLINE <=1 SENSITIVE Sensitive     VANCOMYCIN <=0.5  SENSITIVE Sensitive     TRIMETH/SULFA <=10 SENSITIVE Sensitive     CLINDAMYCIN <=0.25 SENSITIVE Sensitive     RIFAMPIN <=0.5 SENSITIVE Sensitive     Inducible Clindamycin NEGATIVE Sensitive     * FEW STAPHYLOCOCCUS AUREUS    Objective: General: Patient is awake, alert, and oriented x 3 and in no acute distress.  Integument: Skin is warm, dry bilateral. Nails are tender, long, thickened and dystrophic with subungual debris, consistent with onychomycosis, 1-5 bilateral with most involvement right 3,5 and left 1,3-5. No signs of infection. Dressing intact to right great toe placed by [facility] wound care center.   Vasculature:  Dorsalis Pedis pulse 1/4 bilateral. Posterior Tibial pulse  0/4 bilateral. Capillary fill time <5 sec. No hair growth to the level of the digits. No varicosities present bilateral. + Hyperpigmented with decreased turgor to skin bilateral. Trace edema present bilateral.   Neurology: The patient has absent sensation measured with a 5.07/10g Semmes Weinstein Monofilament at all pedal sites bilateral . Vibratory sensation diminished bilateral with tuning fork.  Musculoskeletal: Subjective pain 5th met bases bilateral, pain to bunion and early 2nd hammertoe on left. Muscular strength 5/5 in all lower extremity muscular groups bilateral. No tenderness with calf compression bilateral.  Xrays: Left cyst at 1st met head, bunion, mild 2nd hammertoe, enlarged 5th met base bilateral, no other acute findings.   Assessment and Plan: Problem List Items  Addressed This Visit      Endocrine   Type 1 diabetes mellitus with complication, uncontrolled ([place])    Other Visit Diagnoses    Foot pain, left    -  Primary   Relevant Orders   DG Foot Complete Left   Pain due to onychomycosis of toenails of both feet       PAD (peripheral artery disease) ([facility])       Foot pain, right         -Examined patient. -Xrays reviewed -Recommend conservative care for left bunion and hammertoe -Recommend Diabetic shoes and insoles -Recommend topical pain rubs PRN for bilateral lateral foot pain -Recommend and ordered ABIs bilateral due to diminished pedal pulses with right toe ulceration that is currently being cared for by wound care center  -Discussed and educated patient on diabetic foot care, especially with  regards to the vascular, neurological and musculoskeletal systems.  -Stressed the importance of good glycemic control and the detriment of not  controlling glucose levels in relation to the foot. -Mechanically debrided all nails 1-5 bilateral using sterile nail nipper and filed with dremel without incident and sent distal clipping to [facility] for fungal culture  -Answered all patient questions -Patient to return  in 1 month for discussion of ABIs and Fungal culture results  -Patient advised to call the office if any problems or questions arise in the meantime.  Landis Martins, DPM

## 2017-06-21 NOTE — Telephone Encounter (Signed)
[facility], " THIS [place] MEDICAID MEMBER DOES NOT REQUIRE PRIOR AUTHORIZATION FOAR OUTPATIENT RADIOLOGY THROUGH [facility] OR [facility] AT THIS TIME."

## 2017-06-21 NOTE — Addendum Note (Signed)
Addended by: Celene Skeen A on: 06/21/2017 09:17 PM   Modules accepted: Orders

## 2017-06-21 NOTE — Telephone Encounter (Signed)
Faxed orders to [facility]. 

## 2017-06-22 DIAGNOSIS — K5900 Constipation, unspecified: Secondary | ICD-10-CM | POA: Diagnosis not present

## 2017-06-22 DIAGNOSIS — I10 Essential (primary) hypertension: Secondary | ICD-10-CM | POA: Diagnosis not present

## 2017-06-22 DIAGNOSIS — E1121 Type 2 diabetes mellitus with diabetic nephropathy: Secondary | ICD-10-CM | POA: Diagnosis not present

## 2017-06-22 DIAGNOSIS — H9201 Otalgia, right ear: Secondary | ICD-10-CM | POA: Diagnosis not present

## 2017-06-22 DIAGNOSIS — M26602 Left temporomandibular joint disorder, unspecified: Secondary | ICD-10-CM | POA: Diagnosis not present

## 2017-06-23 ENCOUNTER — Encounter: Payer: Medicare Other | Admitting: Nurse Practitioner

## 2017-06-23 ENCOUNTER — Other Ambulatory Visit (HOSPITAL_COMMUNITY): Payer: Self-pay

## 2017-06-23 ENCOUNTER — Encounter (HOSPITAL_COMMUNITY): Payer: Medicare Other

## 2017-06-23 DIAGNOSIS — E1169 Type 2 diabetes mellitus with other specified complication: Secondary | ICD-10-CM | POA: Diagnosis not present

## 2017-06-23 DIAGNOSIS — Z940 Kidney transplant status: Secondary | ICD-10-CM | POA: Diagnosis not present

## 2017-06-23 DIAGNOSIS — D899 Disorder involving the immune mechanism, unspecified: Secondary | ICD-10-CM | POA: Diagnosis not present

## 2017-06-23 DIAGNOSIS — M869 Osteomyelitis, unspecified: Secondary | ICD-10-CM | POA: Diagnosis not present

## 2017-06-23 DIAGNOSIS — E11621 Type 2 diabetes mellitus with foot ulcer: Secondary | ICD-10-CM | POA: Diagnosis not present

## 2017-06-23 DIAGNOSIS — L97509 Non-pressure chronic ulcer of other part of unspecified foot with unspecified severity: Secondary | ICD-10-CM | POA: Diagnosis not present

## 2017-06-24 ENCOUNTER — Ambulatory Visit (HOSPITAL_COMMUNITY)
Admission: RE | Admit: 2017-06-24 | Discharge: 2017-06-24 | Disposition: A | Discharge status: Home / Self Care | Payer: Medicare Other | Source: Ambulatory Visit | Attending: Nephrology | Admitting: Nephrology

## 2017-06-24 VITALS — BP 146/104 | HR 101 | Temp 98.1°F | Resp 18

## 2017-06-24 DIAGNOSIS — N183 Chronic kidney disease, stage 3 (moderate): Secondary | ICD-10-CM | POA: Diagnosis not present

## 2017-06-24 DIAGNOSIS — D631 Anemia in chronic kidney disease: Secondary | ICD-10-CM | POA: Insufficient documentation

## 2017-06-24 DIAGNOSIS — N182 Chronic kidney disease, stage 2 (mild): Secondary | ICD-10-CM

## 2017-06-24 LAB — POCT HEMOGLOBIN-HEMACUE: HEMOGLOBIN: 10.7 g/dL — AB (ref 12.0–15.0)

## 2017-06-24 MED ORDER — EPOETIN ALFA 40000 UNIT/ML IJ SOLN: 30000.0000 [IU] | INTRAMUSCULAR | Status: DC

## 2017-06-24 MED ORDER — EPOETIN ALFA 10000 UNIT/ML IJ SOLN
INTRAMUSCULAR | Status: AC
  Administered 2017-06-24: 10000 [IU]
  Filled 2017-06-24: qty 1

## 2017-06-24 MED ORDER — EPOETIN ALFA 20000 UNIT/ML IJ SOLN
INTRAMUSCULAR | Status: AC
  Administered 2017-06-24: 20000 [IU]
  Filled 2017-06-24: qty 1

## 2017-06-27 ENCOUNTER — Emergency Department (HOSPITAL_COMMUNITY)
Admission: EM | Admit: 2017-06-27 | Discharge: 2017-06-27 | Disposition: A | Discharge status: Home / Self Care | Payer: Medicare Other | Source: Home / Self Care

## 2017-06-27 ENCOUNTER — Emergency Department (HOSPITAL_COMMUNITY)
Admission: EM | Admit: 2017-06-27 | Discharge: 2017-06-27 | Discharge status: Against Medical Advice | Payer: Medicare Other | Source: Home / Self Care

## 2017-06-27 ENCOUNTER — Other Ambulatory Visit: Payer: Self-pay

## 2017-06-27 ENCOUNTER — Encounter (HOSPITAL_COMMUNITY): Payer: Self-pay | Admitting: Emergency Medicine

## 2017-06-27 DIAGNOSIS — K2970 Gastritis, unspecified, without bleeding: Secondary | ICD-10-CM | POA: Diagnosis not present

## 2017-06-27 DIAGNOSIS — R112 Nausea with vomiting, unspecified: Secondary | ICD-10-CM | POA: Insufficient documentation

## 2017-06-27 DIAGNOSIS — R51 Headache: Secondary | ICD-10-CM | POA: Insufficient documentation

## 2017-06-27 DIAGNOSIS — R079 Chest pain, unspecified: Secondary | ICD-10-CM | POA: Insufficient documentation

## 2017-06-27 DIAGNOSIS — R109 Unspecified abdominal pain: Secondary | ICD-10-CM | POA: Insufficient documentation

## 2017-06-27 DIAGNOSIS — Z5321 Procedure and treatment not carried out due to patient leaving prior to being seen by health care provider: Secondary | ICD-10-CM

## 2017-06-27 DIAGNOSIS — R05 Cough: Secondary | ICD-10-CM | POA: Insufficient documentation

## 2017-06-27 LAB — CBC
HCT: 35.5 % — ABNORMAL LOW (ref 36.0–46.0)
Hemoglobin: 11.5 g/dL — ABNORMAL LOW (ref 12.0–15.0)
MCH: 28.3 pg (ref 26.0–34.0)
MCHC: 32.4 g/dL (ref 30.0–36.0)
MCV: 87.4 fL (ref 78.0–100.0)
PLATELETS: 260 10*3/uL (ref 150–400)
RBC: 4.06 MIL/uL (ref 3.87–5.11)
RDW: 15.2 % (ref 11.5–15.5)
WBC: 11.4 10*3/uL — AB (ref 4.0–10.5)

## 2017-06-27 LAB — LIPASE, BLOOD: LIPASE: 27 U/L (ref 11–51)

## 2017-06-27 LAB — I-STAT BETA HCG BLOOD, ED (MC, WL, AP ONLY)

## 2017-06-27 LAB — COMPREHENSIVE METABOLIC PANEL
ALK PHOS: 77 U/L (ref 38–126)
ALT: 14 U/L (ref 14–54)
AST: 17 U/L (ref 15–41)
Albumin: 3.9 g/dL (ref 3.5–5.0)
Anion gap: 12 (ref 5–15)
BUN: 31 mg/dL — ABNORMAL HIGH (ref 6–20)
CALCIUM: 8.1 mg/dL — AB (ref 8.9–10.3)
CO2: 21 mmol/L — ABNORMAL LOW (ref 22–32)
CREATININE: 1.98 mg/dL — AB (ref 0.44–1.00)
Chloride: 106 mmol/L (ref 101–111)
GFR, EST AFRICAN AMERICAN: 37 mL/min — AB (ref >60)
GFR, EST NON AFRICAN AMERICAN: 32 mL/min — AB (ref >60)
Glucose, Bld: 280 mg/dL — ABNORMAL HIGH (ref 65–99)
Potassium: 4.1 mmol/L (ref 3.5–5.1)
Sodium: 139 mmol/L (ref 135–145)
Total Bilirubin: 0.4 mg/dL (ref 0.3–1.2)
Total Protein: 7.9 g/dL (ref 6.5–8.1)

## 2017-06-27 NOTE — ED Notes (Addendum)
Pt called for triage no answer  °

## 2017-06-27 NOTE — ED Notes (Signed)
Pt requesting IV to be removed, st's she is leaving, unable to wait.   IV removed

## 2017-06-27 NOTE — ED Triage Notes (Signed)
Pt presents with [facility] from home with multiple complaints: headache (no photophobia, no hx of migraine), abd pain with n/v (unable to keep home medications down), CP (worse with movement), cough (with allergies); pt was apparently here earlier today and LWBS, went home and called 911; EMS started PIV and gave 4mg  zofran

## 2017-06-27 NOTE — ED Notes (Signed)
Pt called for triage x4.  No response.

## 2017-06-28 ENCOUNTER — Emergency Department (HOSPITAL_COMMUNITY): Payer: Medicare Other

## 2017-06-28 ENCOUNTER — Emergency Department (HOSPITAL_COMMUNITY)
Admission: EM | Admit: 2017-06-28 | Discharge: 2017-06-28 | Disposition: A | Discharge status: Home / Self Care | Payer: Medicare Other | Source: Home / Self Care | Attending: Emergency Medicine | Admitting: Emergency Medicine

## 2017-06-28 ENCOUNTER — Encounter (HOSPITAL_COMMUNITY): Payer: Self-pay | Admitting: Emergency Medicine

## 2017-06-28 DIAGNOSIS — R112 Nausea with vomiting, unspecified: Secondary | ICD-10-CM | POA: Diagnosis not present

## 2017-06-28 DIAGNOSIS — I10 Essential (primary) hypertension: Secondary | ICD-10-CM | POA: Diagnosis not present

## 2017-06-28 DIAGNOSIS — K8020 Calculus of gallbladder without cholecystitis without obstruction: Secondary | ICD-10-CM

## 2017-06-28 DIAGNOSIS — R109 Unspecified abdominal pain: Secondary | ICD-10-CM | POA: Diagnosis not present

## 2017-06-28 DIAGNOSIS — Z794 Long term (current) use of insulin: Secondary | ICD-10-CM | POA: Insufficient documentation

## 2017-06-28 DIAGNOSIS — Z79899 Other long term (current) drug therapy: Secondary | ICD-10-CM | POA: Insufficient documentation

## 2017-06-28 DIAGNOSIS — N182 Chronic kidney disease, stage 2 (mild): Secondary | ICD-10-CM

## 2017-06-28 DIAGNOSIS — E109 Type 1 diabetes mellitus without complications: Secondary | ICD-10-CM

## 2017-06-28 DIAGNOSIS — R9389 Abnormal findings on diagnostic imaging of other specified body structures: Secondary | ICD-10-CM

## 2017-06-28 DIAGNOSIS — F819 Developmental disorder of scholastic skills, unspecified: Secondary | ICD-10-CM

## 2017-06-28 DIAGNOSIS — I129 Hypertensive chronic kidney disease with stage 1 through stage 4 chronic kidney disease, or unspecified chronic kidney disease: Secondary | ICD-10-CM | POA: Insufficient documentation

## 2017-06-28 DIAGNOSIS — K2970 Gastritis, unspecified, without bleeding: Secondary | ICD-10-CM | POA: Diagnosis not present

## 2017-06-28 DIAGNOSIS — K7689 Other specified diseases of liver: Secondary | ICD-10-CM | POA: Diagnosis not present

## 2017-06-28 DIAGNOSIS — E1121 Type 2 diabetes mellitus with diabetic nephropathy: Secondary | ICD-10-CM | POA: Diagnosis not present

## 2017-06-28 LAB — URINALYSIS, ROUTINE W REFLEX MICROSCOPIC
Bilirubin Urine: NEGATIVE
GLUCOSE, UA: 50 mg/dL — AB
Ketones, ur: 5 mg/dL — AB
Leukocytes, UA: NEGATIVE
NITRITE: NEGATIVE
PROTEIN: 100 mg/dL — AB
Specific Gravity, Urine: 1.017 (ref 1.005–1.030)
pH: 5 (ref 5.0–8.0)

## 2017-06-28 LAB — COMPREHENSIVE METABOLIC PANEL
ALBUMIN: 3.9 g/dL (ref 3.5–5.0)
ALT: 14 U/L (ref 14–54)
ANION GAP: 14 (ref 5–15)
AST: 15 U/L (ref 15–41)
Alkaline Phosphatase: 75 U/L (ref 38–126)
BUN: 40 mg/dL — ABNORMAL HIGH (ref 6–20)
CO2: 24 mmol/L (ref 22–32)
Calcium: 7.9 mg/dL — ABNORMAL LOW (ref 8.9–10.3)
Chloride: 102 mmol/L (ref 101–111)
Creatinine, Ser: 2.19 mg/dL — ABNORMAL HIGH (ref 0.44–1.00)
GFR calc Af Amer: 33 mL/min — ABNORMAL LOW (ref >60)
GFR calc non Af Amer: 28 mL/min — ABNORMAL LOW (ref >60)
GLUCOSE: 333 mg/dL — AB (ref 65–99)
POTASSIUM: 4.5 mmol/L (ref 3.5–5.1)
Sodium: 140 mmol/L (ref 135–145)
Total Bilirubin: 0.8 mg/dL (ref 0.3–1.2)
Total Protein: 7.9 g/dL (ref 6.5–8.1)

## 2017-06-28 LAB — CBC WITH DIFFERENTIAL/PLATELET
BASOS PCT: 0 %
Basophils Absolute: 0 10*3/uL (ref 0.0–0.1)
Eosinophils Absolute: 0.1 10*3/uL (ref 0.0–0.7)
Eosinophils Relative: 1 %
HEMATOCRIT: 33.8 % — AB (ref 36.0–46.0)
HEMOGLOBIN: 10.7 g/dL — AB (ref 12.0–15.0)
LYMPHS ABS: 1.5 10*3/uL (ref 0.7–4.0)
Lymphocytes Relative: 13 %
MCH: 28 pg (ref 26.0–34.0)
MCHC: 31.7 g/dL (ref 30.0–36.0)
MCV: 88.5 fL (ref 78.0–100.0)
MONOS PCT: 5 %
Monocytes Absolute: 0.5 10*3/uL (ref 0.1–1.0)
NEUTROS ABS: 9.4 10*3/uL — AB (ref 1.7–7.7)
NEUTROS PCT: 81 %
Platelets: 288 10*3/uL (ref 150–400)
RBC: 3.82 MIL/uL — ABNORMAL LOW (ref 3.87–5.11)
RDW: 15.4 % (ref 11.5–15.5)
WBC: 11.5 10*3/uL — ABNORMAL HIGH (ref 4.0–10.5)

## 2017-06-28 LAB — LIPASE, BLOOD: Lipase: 26 U/L (ref 11–51)

## 2017-06-28 LAB — CBG MONITORING, ED: GLUCOSE-CAPILLARY: 320 mg/dL — AB (ref 65–99)

## 2017-06-28 MED ORDER — ONDANSETRON HCL 4 MG PO TABS: 4.0000 mg | ORAL_TABLET | Freq: Three times a day (TID) | ORAL | 0 refills | Status: DC | PRN

## 2017-06-28 MED ORDER — MORPHINE SULFATE (PF) 4 MG/ML IV SOLN
4.0000 mg | Freq: Once | INTRAVENOUS | Status: AC
  Administered 2017-06-28: 4 mg via INTRAVENOUS
  Filled 2017-06-28: qty 1

## 2017-06-28 MED ORDER — SODIUM CHLORIDE 0.9 % IV BOLUS
1000.0000 mL | Freq: Once | INTRAVENOUS | Status: AC
  Administered 2017-06-28: 1000 mL via INTRAVENOUS

## 2017-06-28 MED ORDER — ONDANSETRON HCL 4 MG/2ML IJ SOLN
4.0000 mg | Freq: Once | INTRAMUSCULAR | Status: AC
  Administered 2017-06-28: 4 mg via INTRAVENOUS
  Filled 2017-06-28: qty 2

## 2017-06-28 MED ORDER — TRAMADOL HCL 50 MG PO TABS
50.0000 mg | ORAL_TABLET | Freq: Four times a day (QID) | ORAL | 0 refills | Status: DC | PRN
Start: 2017-06-28 — End: 2017-06-28

## 2017-06-28 MED ORDER — HYDROCODONE-ACETAMINOPHEN 5-325 MG PO TABS: 1.0000 | ORAL_TABLET | Freq: Four times a day (QID) | ORAL | 0 refills | Status: DC | PRN

## 2017-06-28 NOTE — ED Notes (Signed)
Bed: TV53 Expected date:  Expected time:  Means of arrival:  Comments: [facility]

## 2017-06-28 NOTE — ED Provider Notes (Addendum)
Medical screening examination/treatment/procedure(s) were conducted as a shared visit with non-physician practitioner(s) and myself.  I personally evaluated the patient during the encounter. Briefly, the patient is a 32 y.o. female with h/o renal transplant, DM, here with post prandial abd pain with n/v. RUQ TTP. Work up consistent with gallstones w/o cholecystitis. She is symptomatic. EGS consulted and cleared the patient for OP management due to pain controlled and oral tolerance.      Fatima Blank, MD 06/30/17 1110

## 2017-06-28 NOTE — ED Triage Notes (Signed)
Patient here via EMS from dr office with complaints of generalized abdominal pain, nausea, vomiting. 4mg  Zofran, 75mcg Fent given.

## 2017-06-28 NOTE — Discharge Instructions (Addendum)
Your seen here today for upper abdominal pain, nausea and vomiting.  You are found to have cholelithiasis.  I have attached a handout on this.  Please follow gallbladder eating plan to decrease frequency of exacerbations.  Please follow-up with [facility] for further evaluation of your symptoms and to discuss further treatment.  Please take Zofran as needed for Nausea and Vomiting.   For breakthrough pain you may take Hydrocodone. Do not drink alcohol drive or operate heavy machinery when taking. You are being provided a prescription for opiates (also known as narcotics) for pain control on an ?as needed? basis.  Opiates can be addictive and should only be used when absolutely necessary for pain control when other alternatives do not work.  We recommend you only use them for the recommended amount of time and only as prescribed.  Please do not take with other sedative medications or alcohol.  Please do not drive, operate machinery, or make important decisions while taking opiates.  Please note that these medications can be addictive and have high abuse potential.  Please keep these medications locked away from children, teenagers or any family members with history of substance abuse. Additionally, these medications may cause constipation - take over the counter stool softeners or add fiber to your diet to treat this (Metamucil, Psyllium Fiber, Colace, Miralax) Further refills will need to be obtained from your primary care doctor and will not be prescribed through the Emergency Department. You will test positive on most drug tests while taking this medication.   Get help right away if: You have sudden pain in the upper right side of your belly, and it lasts for more than 2 hours. You have belly pain that lasts for more than 5 hours. You have a fever or chills. You keep feeling sick to your stomach or you keep throwing up. Your skin or the whites of your eyes turn yellow (jaundice). You  have dark-colored pee (urine). You have light-colored poop (stool).  Your RUQ Ultrasound also showed Mildly heterogeneous liver with biliary prominence and 2 ill-defined echogenic right hepatic lesions largest measures 1.9 cm remaining indeterminate. These warrant further evaluation with nonemergent abdominal MRI without and with contrast. Please follow up with your PCP in regards to this.

## 2017-06-28 NOTE — ED Provider Notes (Signed)
[facility] DEPT Provider Note   CSN: [id number] Arrival date & time: 06/28/17  1303     History   Chief Complaint Chief Complaint  Patient presents with  . Abdominal Pain  . Emesis    HPI [name] is a 32 y.o. female with a history of diabetes type 1, hypertension, hyperlipidemia, prior kidney transplant (2007), CKD who presents the emergency department today for abdominal pain.  Patient states that on Sunday she went to [place] where she ate a broccoli chowder soup.  Shortly after this she started having upper abdominal pain with associated nausea.  She states that her abdominal pain has been colicky in nature since, commonly occurring after fatty foods.  She reports that when it becomes intense, she feels there is a band that is wrapping around her mid abdomen as well as back.  She has had 2-3 episodes of nonbilious, nonbloody emesis per day with the last episode at her doctor's office earlier this morning.  She denies any coffee-ground emesis.  The patient has been taking Zofran for her symptoms without relief. She has not been able to take her home medications today secondary to the emesis.  Her last p.o. intake was around 12 PM today while at the doctor's office and include ice water.  His current pain level is a 7/10.  Prior abdominal surgeries include renal transplantation as well as renal biopsy.  Patient denies any associated fever, chills, chest pain, shortness of breath, lower abdominal pain, flank pain, diarrhea, constipation, urinary frequency, urinary urgency, hematuria, dysuria, melena, hematochezia, vaginal discharge or vaginal bleeding.  Patient states her last bowel movement was 2 days ago and normal for her.  She still passing gas.  Patient denies alcohol use, chronic NSAID use or marijuana use.  HPI  Past Medical History:  Diagnosis Date  . Chronic kidney disease   . Diabetes mellitus    Pt reports diagnosis in June 2011  .  Hyperlipidemia   . Hypertension   . Kidney transplant recipient   . 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 Pyelonephritis 06/23/2014  . UTI (urinary tract infection) 01/09/2015  . 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 Type 1 diabetes mellitus with complication, uncontrolled ([place]) 05/25/2015  . Intractable nausea and vomiting 01/09/2015  . Renal transplant recipient   . Immunosuppressed status ([place])   . 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 CHRONIC KIDNEY DISEASE STAGE II ([facility]) 01/04/2010  . OVARIAN FAILURE, PREMATURE 03/11/2009  . 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 SECONDARY HYPERPARATHYROIDISM 12/05/2007  . OBESITY 09/25/2007  . Anemia in chronic renal disease 09/25/2007  . LEARNING DISABILITY 09/25/2007  . Essential hypertension, benign 09/25/2007  . KIDNEY TRANSPLANTATION, HX OF 09/25/2007    Past Surgical History:  Procedure Laterality Date  . KIDNEY TRANSPLANT  2007  . RENAL BIOPSY Bilateral 2003  . THYROID LOBECTOMY       OB History   None      Home Medications    Prior to Admission medications   Medication Sig Start Date End Date Taking? Authorizing Provider  ACCU-CHEK SOFTCLIX LANCETS lancets Use to check blood sugar 4 times per day. 12/29/15   Renato Shin, MD  calcium carbonate (TUMS) 500 MG chewable tablet Chew 2 tablets by  mouth 3 (three) times daily as needed for indigestion. Takes on Monday, wednesdays, and fridays    [provider]  cetirizine (ZYRTEC) 10 MG tablet Take 10 mg by mouth daily.    [provider]  cyclobenzaprine (FLEXERIL) 10 MG tablet Take 1 tablet (10 mg total) by mouth 2 (two) times daily as needed for muscle spasms. 07/30/16   Montine Circle, PA-C  diazepam (VALIUM) 5 MG tablet Take 1 tablet (5 mg total) by mouth every 6 (six)  hours as needed for anxiety (spasms). 05/11/15   Elnora Morrison, MD  dicyclomine (BENTYL) 20 MG tablet Take 1 tablet (20 mg total) by mouth 2 (two) times daily. 01/13/17   Carlisle Cater, PA-C  famotidine (PEPCID) 40 MG tablet Take 1 tablet (40 mg total) by mouth daily. 01/09/17   Caccavale, Sophia, PA-C  fluticasone (FLONASE) 50 MCG/ACT nasal spray Place 2 sprays into both nostrils daily. 07/20/15   Muthersbaugh, Jarrett Soho, PA-C  gabapentin (NEURONTIN) 100 MG capsule Take 100 mg by mouth daily as needed (for feet).  11/12/16   [provider]  HYDROcodone-acetaminophen (NORCO) 5-325 MG tablet Take 1 tablet by mouth every 4 (four) hours as needed. 12/10/16   Jacqlyn Larsen, PA-C  hydrOXYzine (ATARAX) 25 MG tablet Take 25 mg by mouth 2 (two) times daily as needed for itching.     [provider]  insulin aspart (NOVOLOG FLEXPEN) 100 UNIT/ML FlexPen Inject 15 Units into the skin 3 (three) times daily with meals.    [provider]  insulin degludec (TRESIBA FLEXTOUCH) 100 UNIT/ML SOPN FlexTouch Pen Inject 1.3 mLs (130 Units total) into the skin daily. And pen needles 1/day 07/22/16   Renato Shin, MD  lisinopril-hydrochlorothiazide (PRINZIDE,ZESTORETIC) 20-25 MG tablet Take 1 tablet by mouth daily.    [provider]  loperamide (IMODIUM) 2 MG capsule Take 2 mg by mouth as needed for diarrhea or loose stools. Take 2 tablets as needed for diarrhea.    [provider]  mycophenolate (MYFORTIC) 360 MG TBEC Take 720 mg by mouth 2 (two) times daily.     [provider]  ondansetron (ZOFRAN) 4 MG tablet Take 1 tablet (4 mg total) by mouth every 8 (eight) hours as needed for nausea or vomiting. 01/09/17   Caccavale, Sophia, PA-C  predniSONE (DELTASONE) 5 MG tablet Take 5 mg by mouth at bedtime.     [provider]  prochlorperazine (COMPAZINE) 10 MG tablet Take 1 tablet (10 mg total) by mouth 2 (two) times daily as needed for nausea or vomiting. 01/13/17    Carlisle Cater, PA-C  simvastatin (ZOCOR) 20 MG tablet Take 20 mg by mouth 3 (three) times a week. MWF.    [provider]  tacrolimus (PROGRAF) 1 MG capsule Take 2 mg by mouth 2 (two) times daily.     [provider]    Family History Family History  Problem Relation Age of Onset  . Arthritis Mother   . Diabetes Mother   . Hypertension Mother     Social History Social History   Tobacco Use  . Smoking status: Never Smoker  . Smokeless tobacco: Never Used  Substance Use Topics  . Alcohol use: No  . Drug use: No     Allergies   Benadryl [diphenhydramine-zinc acetate]; Motrin [ibuprofen]; Banana; Diphenhydramine hcl; Doxycycline; Iron dextran; and Shellfish allergy   Review of Systems Review of Systems  All other systems reviewed and are negative.    Physical Exam Updated Vital Signs BP  113/85 (BP Location: Left Arm)   Pulse 91   Temp 98 F (36.7 C) (Oral)   Resp 12   SpO2 98%   Physical Exam  Constitutional: She appears well-developed and well-nourished.  HENT:  Head: Normocephalic and atraumatic.  Right Ear: External ear normal.  Left Ear: External ear normal.  Nose: Nose normal.  Mouth/Throat: Uvula is midline, oropharynx is clear and moist and mucous membranes are normal. No tonsillar exudate.  Eyes: Pupils are equal, round, and reactive to light. Right eye exhibits no discharge. Left eye exhibits no discharge. No scleral icterus.  Neck: Trachea normal. Neck supple. No spinous process tenderness present. No neck rigidity. Normal range of motion present.  Cardiovascular: Normal rate, regular rhythm and intact distal pulses.  No murmur heard. Pulses:      Radial pulses are 2+ on the right side, and 2+ on the left side.       Dorsalis pedis pulses are 2+ on the right side, and 2+ on the left side.       Posterior tibial pulses are 2+ on the right side, and 2+ on the left side.  No lower extremity swelling or edema. Calves symmetric in size  bilaterally.  Pulmonary/Chest: Effort normal and breath sounds normal. She exhibits no tenderness.  Abdominal: Soft. Bowel sounds are normal. She exhibits no distension. There is generalized tenderness and tenderness in the right upper quadrant and epigastric area. There is CVA tenderness (right). There is no rigidity, no rebound, no guarding and no tenderness at McBurney's point.  Musculoskeletal: She exhibits no edema.  Lymphadenopathy:    She has no cervical adenopathy.  Neurological: She is alert.  Skin: Skin is warm and dry. No rash noted. She is not diaphoretic.  Psychiatric: She has a normal mood and affect.  Nursing note and vitals reviewed.    ED Treatments / Results  Labs (all labs ordered are listed, but only abnormal results are displayed) Labs Reviewed  COMPREHENSIVE METABOLIC PANEL - Abnormal; Notable for the following components:      Result Value   Glucose, Bld 333 (*)    BUN 40 (*)    Creatinine, Ser 2.19 (*)    Calcium 7.9 (*)    GFR calc non Af Amer 28 (*)    GFR calc Af Amer 33 (*)    All other components within normal limits  CBC WITH DIFFERENTIAL/PLATELET - Abnormal; Notable for the following components:   WBC 11.5 (*)    RBC 3.82 (*)    Hemoglobin 10.7 (*)    HCT 33.8 (*)    Neutro Abs 9.4 (*)    All other components within normal limits  URINALYSIS, ROUTINE W REFLEX MICROSCOPIC - Abnormal; Notable for the following components:   Glucose, UA 50 (*)    Hgb urine dipstick SMALL (*)    Ketones, ur 5 (*)    Protein, ur 100 (*)    Bacteria, UA RARE (*)    Squamous Epithelial / LPF 0-5 (*)    All other components within normal limits  CBG MONITORING, ED - Abnormal; Notable for the following components:   Glucose-Capillary 320 (*)    All other components within normal limits  LIPASE, BLOOD    EKG None  Radiology US Abdomen Limited Ruq  Result Date: 06/28/2017 CLINICAL DATA:  Abdominal pain, nausea and vomiting EXAM: ULTRASOUND ABDOMEN LIMITED RIGHT  UPPER QUADRANT COMPARISON:  12/10/2016 FINDINGS: Gallbladder: Small echogenic shadowing gallstones, largest measures 7 mm. These appear mobile. No associated  wall thickening or pericholecystic fluid. Wall thickness measures 2 mm. No Murphy's sign elicited. Common bile duct: Diameter: 4.5 mm Liver: Mildly heterogeneous increased echogenicity. Slight diffuse intrahepatic biliary prominence, nonspecific. There are 2 adjacent ill-defined echogenic areas or lesions in the right hepatic lobe 1 measuring 1.5 x 1.9 x 1.7 cm and a second measuring 1.5 x 1.4 x 1.8 cm. No available contrast-enhanced studies of the liver to accurately assess for a previous hepatic abnormality. Further evaluation will be recommended with abdominal MRI without and with contrast. Portal vein is patent on color Doppler imaging with normal direction of blood flow towards the liver. IMPRESSION: Cholelithiasis, measuring up to 7 mm. No signs of cholecystitis. Mildly heterogeneous liver with biliary prominence and 2 ill-defined echogenic right hepatic lesions largest measures 1.9 cm remaining indeterminate. These warrant further evaluation with nonemergent abdominal MRI without and with contrast. Electronically Signed   By: Jerilynn Mages.  Shick M.D.   On: 06/28/2017 14:57    Procedures Procedures (including critical care time)  Medications Ordered in ED Medications  morphine 4 MG/ML injection 4 mg (4 mg Intravenous Given 06/28/17 1430)  sodium chloride 0.9 % bolus 1,000 mL (0 mLs Intravenous Stopped 06/28/17 1540)  morphine 4 MG/ML injection 4 mg (4 mg Intravenous Given 06/28/17 1544)  ondansetron (ZOFRAN) injection 4 mg (4 mg Intravenous Given 06/28/17 1544)     Initial Impression / Assessment and Plan / ED Course  I have reviewed the triage vital signs and the nursing notes.  Pertinent labs & imaging results that were available during my care of the patient were reviewed by me and considered in my medical decision making (see chart for  details).     32 year old female presenting for upper abdominal pain that is colicky in nature and commonly occurs after fatty/greasy food with associated nausea and emesis.  Emesis is nonbilious, nonbloody in nature.  Patient's vital signs are reassuring on presentation.  No fever, tachycardia, tachypnea, hypoxia or hypotension.  She is non-ill and nonseptic appearing.  Patient does have epigastric and right upper quadrant tenderness palpation.  Will order right upper quadrant ultrasound to evaluate.  Will order screening labs.  Will give liter bolus of IV fluids, pain and nausea medication.  Patient's right quadrant ultrasound with cholelithiasis, measuring up to 7 mm without evidence of cholecystitis.  There is no associated wall thickening or pericholecystic fluid.  Common bile duct within normal limits.  Patient does have a leukocytosis of 11.5 however patient's LFTs are within normal limits.  Bilirubin within normal limits.  Lipase within normal limits.  After pain medication patient without tenderness palpation or peritoneal signs.  Patient feels pain is currently controlled.  She has been tolerating fluids in the department is without emesis.  Do not feel patient needs emergent consultation to surgery for evaluation.  Given patient's pain is controlled and she is without emesis in the department, tolerating p.o. fluids feel the patient can be treated with pain medication and follow-up with [place] surgery.   Patient's labs otherwise reassuring.  She does have anemia of 10.7 that is at baseline.  No significant electrolyte abnormalities.  She does have hyperglycemia without anion gap acidosis.  Do not suspect DKA.  Patient's creatinine and BUN elevated, likely secondary to patient's emesis and decreased oral intake.  No acute kidney injury.  Patient was given IV fluids in the department and is tolerating p.o. fluids.  Without urinary symptoms and urinalysis without evidence of UTI.  Will  include on patient's discharge instructions  findings of mild heterogeneous liver with biliary prominence that may warrant nonemergent abdominal MRI. The evaluation does not show pathology that would require ongoing emergent intervention or inpatient treatment. I advised the patient to follow-up with [facility] and PCP this week. I advised the patient to return to the emergency department with new or worsening symptoms or new concerns. Specific return precautions discussed. The patient verbalized understanding and agreement with plan. All questions answered. No further questions at this time. The patient is hemodynamically stable, mentating appropriately and appears safe for discharge.  Patient case seen and discussed with Dr. Leonette Monarch who is in agreement with plan.   Patient reviewed in [facility] without discrepancies.   Final Clinical Impressions(s) / ED Diagnoses   Final diagnoses:  Abdominal pain  Calculus of gallbladder without cholecystitis without obstruction    ED Discharge Orders        Ordered    traMADol (ULTRAM) 50 MG tablet  Every 6 hours PRN     06/28/17 1700    ondansetron (ZOFRAN) 4 MG tablet  Every 8 hours PRN     06/28/17 1700       Jillyn Ledger, PA-C 06/28/17 1700

## 2017-06-28 NOTE — ED Notes (Signed)
Per PCP, states patient come in today with abdominal pain-states patient has a history of HTN, DM, kidney transplant-thinks she might be dehydrated

## 2017-06-29 ENCOUNTER — Inpatient Hospital Stay (HOSPITAL_COMMUNITY)
Admission: EM | Admit: 2017-06-29 | Discharge: 2017-07-01 | DRG: 418 | Disposition: A | Discharge status: Home / Self Care | Payer: Medicare Other | Attending: Surgery | Admitting: Surgery

## 2017-06-29 ENCOUNTER — Other Ambulatory Visit: Payer: Self-pay

## 2017-06-29 ENCOUNTER — Encounter (HOSPITAL_COMMUNITY): Payer: Self-pay | Admitting: Emergency Medicine

## 2017-06-29 ENCOUNTER — Emergency Department (HOSPITAL_COMMUNITY): Payer: Medicare Other

## 2017-06-29 DIAGNOSIS — N183 Chronic kidney disease, stage 3 (moderate): Secondary | ICD-10-CM | POA: Diagnosis not present

## 2017-06-29 DIAGNOSIS — R112 Nausea with vomiting, unspecified: Secondary | ICD-10-CM | POA: Diagnosis not present

## 2017-06-29 DIAGNOSIS — Z91012 Allergy to eggs: Secondary | ICD-10-CM | POA: Diagnosis not present

## 2017-06-29 DIAGNOSIS — E1022 Type 1 diabetes mellitus with diabetic chronic kidney disease: Secondary | ICD-10-CM | POA: Diagnosis present

## 2017-06-29 DIAGNOSIS — Z79899 Other long term (current) drug therapy: Secondary | ICD-10-CM

## 2017-06-29 DIAGNOSIS — K7689 Other specified diseases of liver: Secondary | ICD-10-CM | POA: Diagnosis not present

## 2017-06-29 DIAGNOSIS — Z940 Kidney transplant status: Secondary | ICD-10-CM | POA: Diagnosis not present

## 2017-06-29 DIAGNOSIS — I129 Hypertensive chronic kidney disease with stage 1 through stage 4 chronic kidney disease, or unspecified chronic kidney disease: Secondary | ICD-10-CM | POA: Diagnosis present

## 2017-06-29 DIAGNOSIS — K819 Cholecystitis, unspecified: Secondary | ICD-10-CM

## 2017-06-29 DIAGNOSIS — Z91018 Allergy to other foods: Secondary | ICD-10-CM

## 2017-06-29 DIAGNOSIS — Z7951 Long term (current) use of inhaled steroids: Secondary | ICD-10-CM | POA: Diagnosis not present

## 2017-06-29 DIAGNOSIS — Z886 Allergy status to analgesic agent status: Secondary | ICD-10-CM

## 2017-06-29 DIAGNOSIS — K8020 Calculus of gallbladder without cholecystitis without obstruction: Secondary | ICD-10-CM | POA: Diagnosis not present

## 2017-06-29 DIAGNOSIS — D631 Anemia in chronic kidney disease: Secondary | ICD-10-CM | POA: Diagnosis present

## 2017-06-29 DIAGNOSIS — Z91013 Allergy to seafood: Secondary | ICD-10-CM

## 2017-06-29 DIAGNOSIS — N19 Unspecified kidney failure: Secondary | ICD-10-CM | POA: Diagnosis not present

## 2017-06-29 DIAGNOSIS — E669 Obesity, unspecified: Secondary | ICD-10-CM | POA: Diagnosis not present

## 2017-06-29 DIAGNOSIS — Z881 Allergy status to other antibiotic agents status: Secondary | ICD-10-CM | POA: Diagnosis not present

## 2017-06-29 DIAGNOSIS — R1011 Right upper quadrant pain: Secondary | ICD-10-CM | POA: Diagnosis not present

## 2017-06-29 DIAGNOSIS — K8010 Calculus of gallbladder with chronic cholecystitis without obstruction: Secondary | ICD-10-CM | POA: Diagnosis not present

## 2017-06-29 DIAGNOSIS — E785 Hyperlipidemia, unspecified: Secondary | ICD-10-CM | POA: Diagnosis not present

## 2017-06-29 DIAGNOSIS — Z794 Long term (current) use of insulin: Secondary | ICD-10-CM

## 2017-06-29 DIAGNOSIS — N2889 Other specified disorders of kidney and ureter: Secondary | ICD-10-CM | POA: Diagnosis not present

## 2017-06-29 DIAGNOSIS — Z833 Family history of diabetes mellitus: Secondary | ICD-10-CM | POA: Diagnosis not present

## 2017-06-29 DIAGNOSIS — Z6828 Body mass index (BMI) 28.0-28.9, adult: Secondary | ICD-10-CM

## 2017-06-29 DIAGNOSIS — F819 Developmental disorder of scholastic skills, unspecified: Secondary | ICD-10-CM | POA: Diagnosis not present

## 2017-06-29 DIAGNOSIS — N2581 Secondary hyperparathyroidism of renal origin: Secondary | ICD-10-CM | POA: Diagnosis not present

## 2017-06-29 DIAGNOSIS — E119 Type 2 diabetes mellitus without complications: Secondary | ICD-10-CM | POA: Diagnosis not present

## 2017-06-29 DIAGNOSIS — Z7952 Long term (current) use of systemic steroids: Secondary | ICD-10-CM | POA: Diagnosis not present

## 2017-06-29 DIAGNOSIS — E1129 Type 2 diabetes mellitus with other diabetic kidney complication: Secondary | ICD-10-CM | POA: Diagnosis not present

## 2017-06-29 DIAGNOSIS — I10 Essential (primary) hypertension: Secondary | ICD-10-CM | POA: Diagnosis not present

## 2017-06-29 DIAGNOSIS — K2970 Gastritis, unspecified, without bleeding: Secondary | ICD-10-CM | POA: Diagnosis not present

## 2017-06-29 DIAGNOSIS — Q970 Karyotype 47, XXX: Secondary | ICD-10-CM | POA: Diagnosis not present

## 2017-06-29 DIAGNOSIS — K811 Chronic cholecystitis: Secondary | ICD-10-CM | POA: Diagnosis present

## 2017-06-29 DIAGNOSIS — K769 Liver disease, unspecified: Secondary | ICD-10-CM | POA: Diagnosis not present

## 2017-06-29 LAB — COMPREHENSIVE METABOLIC PANEL
ALT: 15 U/L (ref 14–54)
AST: 16 U/L (ref 15–41)
Albumin: 3.8 g/dL (ref 3.5–5.0)
Alkaline Phosphatase: 73 U/L (ref 38–126)
Anion gap: 11 (ref 5–15)
BUN: 44 mg/dL — ABNORMAL HIGH (ref 6–20)
CO2: 23 mmol/L (ref 22–32)
Calcium: 7.3 mg/dL — ABNORMAL LOW (ref 8.9–10.3)
Chloride: 103 mmol/L (ref 101–111)
Creatinine, Ser: 2.34 mg/dL — ABNORMAL HIGH (ref 0.44–1.00)
GFR calc Af Amer: 30 mL/min — ABNORMAL LOW (ref >60)
GFR calc non Af Amer: 26 mL/min — ABNORMAL LOW (ref >60)
Glucose, Bld: 240 mg/dL — ABNORMAL HIGH (ref 65–99)
Potassium: 3.5 mmol/L (ref 3.5–5.1)
Sodium: 137 mmol/L (ref 135–145)
Total Bilirubin: 0.7 mg/dL (ref 0.3–1.2)
Total Protein: 8 g/dL (ref 6.5–8.1)

## 2017-06-29 LAB — URINALYSIS, ROUTINE W REFLEX MICROSCOPIC
Bacteria, UA: NONE SEEN
Bilirubin Urine: NEGATIVE
Glucose, UA: NEGATIVE mg/dL
Ketones, ur: NEGATIVE mg/dL
Leukocytes, UA: NEGATIVE
Nitrite: NEGATIVE
Protein, ur: 100 mg/dL — AB
Specific Gravity, Urine: 1.016 (ref 1.005–1.030)
Squamous Epithelial / LPF: NONE SEEN
pH: 5 (ref 5.0–8.0)

## 2017-06-29 LAB — CBC
HCT: 34.2 % — ABNORMAL LOW (ref 36.0–46.0)
Hemoglobin: 10.7 g/dL — ABNORMAL LOW (ref 12.0–15.0)
MCH: 27.5 pg (ref 26.0–34.0)
MCHC: 31.3 g/dL (ref 30.0–36.0)
MCV: 87.9 fL (ref 78.0–100.0)
Platelets: 293 10*3/uL (ref 150–400)
RBC: 3.89 MIL/uL (ref 3.87–5.11)
RDW: 15.2 % (ref 11.5–15.5)
WBC: 10.1 10*3/uL (ref 4.0–10.5)

## 2017-06-29 LAB — GLUCOSE, CAPILLARY
GLUCOSE-CAPILLARY: 155 mg/dL — AB (ref 65–99)
GLUCOSE-CAPILLARY: 190 mg/dL — AB (ref 65–99)

## 2017-06-29 LAB — PREGNANCY, URINE: Preg Test, Ur: NEGATIVE

## 2017-06-29 LAB — LIPASE, BLOOD: Lipase: 30 U/L (ref 11–51)

## 2017-06-29 MED ORDER — HYDROMORPHONE HCL 1 MG/ML IJ SOLN
1.0000 mg | Freq: Once | INTRAMUSCULAR | Status: AC
  Administered 2017-06-29: 1 mg via INTRAVENOUS
  Filled 2017-06-29: qty 1

## 2017-06-29 MED ORDER — TACROLIMUS 1 MG PO CAPS
2.0000 mg | ORAL_CAPSULE | Freq: Two times a day (BID) | ORAL | Status: DC
  Administered 2017-06-29 – 2017-06-30 (×2): 2 mg via ORAL
  Filled 2017-06-29 (×4): qty 2

## 2017-06-29 MED ORDER — CYCLOBENZAPRINE HCL 10 MG PO TABS
10.0000 mg | ORAL_TABLET | Freq: Two times a day (BID) | ORAL | Status: DC | PRN
  Administered 2017-07-01: 10 mg via ORAL
  Filled 2017-06-29: qty 1

## 2017-06-29 MED ORDER — HYDROMORPHONE HCL 1 MG/ML IJ SOLN
1.0000 mg | INTRAMUSCULAR | Status: DC | PRN
  Administered 2017-06-29 – 2017-07-01 (×6): 1 mg via INTRAVENOUS
  Filled 2017-06-29 (×6): qty 1

## 2017-06-29 MED ORDER — EPOETIN ALFA 10000 UNIT/ML IJ SOLN: 30000.0000 [IU] | INTRAMUSCULAR | Status: DC

## 2017-06-29 MED ORDER — INSULIN GLARGINE 100 UNIT/ML ~~LOC~~ SOLN
40.0000 [IU] | Freq: Every day | SUBCUTANEOUS | Status: DC
  Administered 2017-06-29 – 2017-06-30 (×2): 40 [IU] via SUBCUTANEOUS
  Filled 2017-06-29 (×2): qty 0.4

## 2017-06-29 MED ORDER — GABAPENTIN 100 MG PO CAPS: 200.0000 mg | ORAL_CAPSULE | Freq: Every day | ORAL | Status: DC | PRN

## 2017-06-29 MED ORDER — ONDANSETRON HCL 4 MG/2ML IJ SOLN
4.0000 mg | Freq: Four times a day (QID) | INTRAMUSCULAR | Status: DC | PRN
  Administered 2017-06-30 (×2): 4 mg via INTRAVENOUS
  Filled 2017-06-29: qty 2

## 2017-06-29 MED ORDER — INSULIN ASPART 100 UNIT/ML ~~LOC~~ SOLN
0.0000 [IU] | SUBCUTANEOUS | Status: DC
  Administered 2017-06-30: 3 [IU] via SUBCUTANEOUS
  Administered 2017-06-30: 4 [IU] via SUBCUTANEOUS

## 2017-06-29 MED ORDER — SODIUM CHLORIDE 0.9 % IV BOLUS
1000.0000 mL | Freq: Once | INTRAVENOUS | Status: AC
  Administered 2017-06-29: 1000 mL via INTRAVENOUS

## 2017-06-29 MED ORDER — SODIUM CHLORIDE 0.45 % IV SOLN
INTRAVENOUS | Status: DC
  Administered 2017-06-29 – 2017-07-01 (×3): 1000 mL via INTRAVENOUS

## 2017-06-29 MED ORDER — MYCOPHENOLATE SODIUM 180 MG PO TBEC
720.0000 mg | DELAYED_RELEASE_TABLET | Freq: Two times a day (BID) | ORAL | Status: DC
  Administered 2017-06-30 (×2): 720 mg via ORAL
  Filled 2017-06-29 (×4): qty 4

## 2017-06-29 MED ORDER — ONDANSETRON 4 MG PO TBDP: 4.0000 mg | ORAL_TABLET | Freq: Four times a day (QID) | ORAL | Status: DC | PRN

## 2017-06-29 MED ORDER — FAMOTIDINE 20 MG PO TABS
40.0000 mg | ORAL_TABLET | Freq: Every day | ORAL | Status: DC
  Administered 2017-06-29: 40 mg via ORAL
  Filled 2017-06-29: qty 2

## 2017-06-29 MED ORDER — ONDANSETRON HCL 4 MG/2ML IJ SOLN
4.0000 mg | Freq: Once | INTRAMUSCULAR | Status: AC
  Administered 2017-06-29: 4 mg via INTRAVENOUS
  Filled 2017-06-29: qty 2

## 2017-06-29 MED ORDER — SODIUM CHLORIDE 0.9 % IV BOLUS
500.0000 mL | Freq: Once | INTRAVENOUS | Status: AC
  Administered 2017-06-29: 500 mL via INTRAVENOUS

## 2017-06-29 MED ORDER — FLUTICASONE PROPIONATE 50 MCG/ACT NA SUSP
2.0000 | Freq: Every day | NASAL | Status: DC
  Administered 2017-06-29: 2 via NASAL
  Filled 2017-06-29: qty 16

## 2017-06-29 MED ORDER — PREDNISONE 5 MG PO TABS
5.0000 mg | ORAL_TABLET | Freq: Every day | ORAL | Status: DC
  Administered 2017-06-29 – 2017-06-30 (×2): 5 mg via ORAL
  Filled 2017-06-29 (×2): qty 1

## 2017-06-29 MED ORDER — HEPARIN SODIUM (PORCINE) 5000 UNIT/ML IJ SOLN
5000.0000 [IU] | Freq: Three times a day (TID) | INTRAMUSCULAR | Status: DC
  Administered 2017-06-29 – 2017-06-30 (×2): 5000 [IU] via SUBCUTANEOUS
  Filled 2017-06-29 (×2): qty 1

## 2017-06-29 MED ORDER — CALCIUM CARBONATE ANTACID 500 MG PO CHEW: 2.0000 | CHEWABLE_TABLET | Freq: Three times a day (TID) | ORAL | Status: DC | PRN

## 2017-06-29 MED ORDER — HYDROMORPHONE HCL 1 MG/ML IJ SOLN
1.0000 mg | Freq: Once | INTRAMUSCULAR | Status: AC
Start: 2017-06-29 — End: 2017-06-29
  Administered 2017-06-29: 1 mg via INTRAVENOUS
  Filled 2017-06-29: qty 1

## 2017-06-29 MED ORDER — DIAZEPAM 5 MG PO TABS
5.0000 mg | ORAL_TABLET | Freq: Four times a day (QID) | ORAL | Status: DC | PRN
  Administered 2017-06-30 – 2017-07-01 (×2): 5 mg via ORAL
  Filled 2017-06-29 (×2): qty 1

## 2017-06-29 MED ORDER — FUROSEMIDE 20 MG PO TABS: 20.0000 mg | ORAL_TABLET | Freq: Every day | ORAL | Status: DC | PRN

## 2017-06-29 NOTE — ED Notes (Signed)
ED TO INPATIENT HANDOFF REPORT  Name/Age/Gender [name] 32 y.o. female  Code Status Code Status History    Date Active Date Inactive Code Status Order ID Comments User Context   12/11/2016 2212 12/18/2016 0036 Full Code [id number]  Vianne Bulls, MD ED   01/09/2015 1655 01/12/2015 2123 Full Code [id number]  Willia Craze, NP Inpatient   01/05/2015 1855 01/07/2015 1530 Full Code [id number]  Florencia Reasons, MD Inpatient   09/30/2014 1313 10/01/2014 2243 Full Code [id number]  Truett Mainland, DO Inpatient   06/24/2014 0031 06/25/2014 2337 Full Code [id number]  Rise Patience, MD Inpatient   12/22/2012 2251 12/23/2012 2223 Full Code [id number]  Toy Baker, MD Inpatient      Home/SNF/Other Home  Chief Complaint abd pain   Level of Care/Admitting Diagnosis ED Disposition    ED Disposition Condition [place] [facility] Area: [facility] [[id number]]  Level of Care: Med-Surg [16]  Diagnosis: Chronic cholecystitis [575.11.ICD-9-CM]  Admitting Physician: [place], [place]  Attending Physician: [facility], MD [[id number]]  Estimated length of stay: past midnight tomorrow  Certification:: I certify this patient will need inpatient services for at least 2 midnights  PT Class (Do Not Modify): Inpatient [101]  PT Acc Code (Do Not Modify): Private [1]       Medical History Past Medical History:  Diagnosis Date  . Chronic kidney disease   . Diabetes mellitus    Pt reports diagnosis in June 2011  . Hyperlipidemia   . Hypertension   . Kidney transplant recipient   . 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 Pyelonephritis 06/23/2014  . UTI (urinary tract infection) 01/09/2015  . 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 Benadryl [Diphenhydramine-Zinc Acetate] Shortness Of Breath  . Motrin [Ibuprofen] Shortness Of  Breath and Itching    Per pt  . Banana Other (See Comments)    Sick on the stomach  . Diphenhydramine Hcl     REACTION: Stopped breathing in ICU  . Doxycycline     Shortness of Breath   . Iron Dextran     REACTION: vein irritation  . Milk-Related Compounds     intolerant  . Shellfish Allergy Hives    IV Location/Drains/Wounds Patient Lines/Drains/Airways Status   Active Line/Drains/Airways    Name:   Placement date:   Placement time:   Site:   Days:   Peripheral IV 06/29/17 Right Forearm   06/29/17    1712    Forearm   less than 1          Labs/Imaging Results for orders placed or performed during the hospital encounter of 06/29/17 (from the past 48 hour(s))  Lipase, blood     Status: None   Collection Time: 06/29/17  4:55 PM  Result Value Ref Range   Lipase 30 11 - 51 U/L    Comment: Performed at [facility], [place] [address] [place], [place] [address]  Comprehensive metabolic panel     Status: Abnormal   Collection Time: 06/29/17  4:55 PM  Result Value Ref Range   Sodium 137 135 - 145 mmol/L   Potassium 3.5 3.5 - 5.1 mmol/L    Comment: DELTA CHECK NOTED   Chloride 103 101 - 111 mmol/L   CO2 23 22 - 32  mmol/L   Glucose, Bld 240 (H) 65 - 99 mg/dL   BUN 44 (H) 6 - 20 mg/dL   Creatinine, Ser 2.34 (H) 0.44 - 1.00 mg/dL   Calcium 7.3 (L) 8.9 - 10.3 mg/dL   Total Protein 8.0 6.5 - 8.1 g/dL   Albumin 3.8 3.5 - 5.0 g/dL   AST 16 15 - 41 U/L   ALT 15 14 - 54 U/L   Alkaline Phosphatase 73 38 - 126 U/L   Total Bilirubin 0.7 0.3 - 1.2 mg/dL   GFR calc non Af Amer 26 (L) >60 mL/min   GFR calc Af Amer 30 (L) >60 mL/min    Comment: (NOTE) The eGFR has been calculated using the CKD EPI equation. This calculation has not been validated in all clinical situations. eGFR's persistently <60 mL/min signify possible Chronic Kidney Disease.    Anion gap 11 5 - 15    Comment: Performed at [facility], [place] [address], [place], [place] [address]   CBC     Status: Abnormal   Collection Time: 06/29/17  4:55 PM  Result Value Ref Range   WBC 10.1 4.0 - 10.5 K/uL   RBC 3.89 3.87 - 5.11 MIL/uL   Hemoglobin 10.7 (L) 12.0 - 15.0 g/dL   HCT 34.2 (L) 36.0 - 46.0 %   MCV 87.9 78.0 - 100.0 fL   MCH 27.5 26.0 - 34.0 pg   MCHC 31.3 30.0 - 36.0 g/dL   RDW 15.2 11.5 - 15.5 %   Platelets 293 150 - 400 K/uL    Comment: Performed at [facility], [place] [address] [place], [place] [address]  Urinalysis, Routine w reflex microscopic     Status: Abnormal   Collection Time: 06/29/17  5:04 PM  Result Value Ref Range   Color, Urine YELLOW YELLOW   APPearance HAZY (A) CLEAR   Specific Gravity, Urine 1.016 1.005 - 1.030   pH 5.0 5.0 - 8.0   Glucose, UA NEGATIVE NEGATIVE mg/dL   Hgb urine dipstick SMALL (A) NEGATIVE   Bilirubin Urine NEGATIVE NEGATIVE   Ketones, ur NEGATIVE NEGATIVE mg/dL   Protein, ur 100 (A) NEGATIVE mg/dL   Nitrite NEGATIVE NEGATIVE   Leukocytes, UA NEGATIVE NEGATIVE   RBC / HPF 0-5 0 - 5 RBC/hpf   WBC, UA 0-5 0 - 5 WBC/hpf   Bacteria, UA NONE SEEN NONE SEEN   Squamous Epithelial / LPF NONE SEEN NONE SEEN   Mucus PRESENT     Comment: Performed at [facility], [place] [address], [place], [place] [address]  Pregnancy, urine     Status: None   Collection Time: 06/29/17  5:04 PM  Result Value Ref Range   Preg Test, Ur NEGATIVE NEGATIVE    Comment:        THE SENSITIVITY OF THIS METHODOLOGY IS >20 mIU/mL. Performed at [facility], [place] [name] [place], [place] [address]    US Abdomen Limited Ruq  Result Date: 06/29/2017 CLINICAL DATA:  RIGHT upper quadrant pain. History of kidney stones, renal transplant, melanoma. EXAM: ULTRASOUND ABDOMEN LIMITED RIGHT UPPER QUADRANT COMPARISON:  Abdominal ultrasound June 28, 2017. CT abdomen and pelvis December 10, 2016. FINDINGS: Gallbladder: Multiple echogenic gallstones with acoustic shadowing measuring to 7 mm. No gallbladder  wall thickening or pericholecystic fluid. No sonographic Murphy's sign elicited. Common bile duct: Diameter: 6 mm, top-normal.  No choledocholithiasis. Liver: Two echogenic masses measuring to 2 cm, unchanged from yesterday. Heterogeneous echotexture. Vein is  patent on color Doppler imaging with normal direction of blood flow towards the liver. IMPRESSION: 1. Stable cholelithiasis without sonographic findings of acute cholecystitis. 2. Two small echogenic liver lesions could reflect hemangiomas though, would be better characterized on NONEMERGENT MRI with and without contrast, when patient's renal function improves. Electronically Signed   By: Elon Alas M.D.   On: 06/29/2017 17:14   US Abdomen Limited Ruq  Result Date: 06/28/2017 CLINICAL DATA:  Abdominal pain, nausea and vomiting EXAM: ULTRASOUND ABDOMEN LIMITED RIGHT UPPER QUADRANT COMPARISON:  12/10/2016 FINDINGS: Gallbladder: Small echogenic shadowing gallstones, largest measures 7 mm. These appear mobile. No associated wall thickening or pericholecystic fluid. Wall thickness measures 2 mm. No Murphy's sign elicited. Common bile duct: Diameter: 4.5 mm Liver: Mildly heterogeneous increased echogenicity. Slight diffuse intrahepatic biliary prominence, nonspecific. There are 2 adjacent ill-defined echogenic areas or lesions in the right hepatic lobe 1 measuring 1.5 x 1.9 x 1.7 cm and a second measuring 1.5 x 1.4 x 1.8 cm. No available contrast-enhanced studies of the liver to accurately assess for a previous hepatic abnormality. Further evaluation will be recommended with abdominal MRI without and with contrast. Portal vein is patent on color Doppler imaging with normal direction of blood flow towards the liver. IMPRESSION: Cholelithiasis, measuring up to 7 mm. No signs of cholecystitis. Mildly heterogeneous liver with biliary prominence and 2 ill-defined echogenic right hepatic lesions largest measures 1.9 cm remaining indeterminate. These warrant  further evaluation with nonemergent abdominal MRI without and with contrast. Electronically Signed   By: Jerilynn Mages.  Shick M.D.   On: 06/28/2017 14:57    Pending Labs [employer] (From admission, onward)   Start     Ordered   Signed and Held  CBC  (heparin)  Once,   R    Comments:  Baseline for heparin therapy IF NOT ALREADY DRAWN.  Notify MD if PLT < 100 K.    Signed and Held   Signed and Held  Creatinine, serum  (heparin)  Once,   R    Comments:  Baseline for heparin therapy IF NOT ALREADY DRAWN.    Signed and Held   Signed and Held  Comprehensive metabolic panel  Tomorrow morning,   R     Signed and Held   Signed and Held  Magnesium  Tomorrow morning,   R     Signed and Held   Signed and Held  Phosphorus  Tomorrow morning,   R     Signed and Held   Signed and Held  CBC  Tomorrow morning,   R     Signed and Held      Vitals/Pain Today's Vitals   06/29/17 1341 06/29/17 1344 06/29/17 1550 06/29/17 1805  BP:  (!) 155/115 (!) 166/141 (!) 161/108  Pulse:  100 94 93  Resp:  20  18  Temp:  98 F (36.7 C)    TempSrc:  Oral    SpO2:  100% 100% 100%  PainSc: 10-Worst pain ever       Isolation Precautions No active isolations  Medications Medications  HYDROmorphone (DILAUDID) injection 1 mg (1 mg Intravenous Given 06/29/17 1713)  sodium chloride 0.9 % bolus 1,000 mL (0 mLs Intravenous Stopped 06/29/17 1758)  ondansetron (ZOFRAN) injection 4 mg (4 mg Intravenous Given 06/29/17 1712)  HYDROmorphone (DILAUDID) injection 1 mg (1 mg Intravenous Given 06/29/17 1800)  sodium chloride 0.9 % bolus 500 mL (500 mLs Intravenous New Bag/Given 06/29/17 1800)    Mobility walks

## 2017-06-29 NOTE — ED Notes (Signed)
This nurse attempted PIV placement without success. Ultrasound IV team notified. Abdominal ultrasound at patient bedside at this time.

## 2017-06-29 NOTE — H&P (Signed)
Chief Complaint:  Upper abdominal pain and gallstones  History of Present Illness:  [name] is an 32 y.o. 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  Her husband has brought her in this for the third time with abdominal pain and presumed cholecystitis.  She is crying and distraught.  This is her third trip to the ER.  She has been vomiting and has difficulty keeping her medications down.    Her nephrologist is Dr. Jamal Maes.  She is on 5 mg Prednisone daily and Prograf 2 mg BID.  She is an insulin dependent diabetic.  I will admit her to the [facility] service for medical workup and cholecystectomy.    Past Medical History:  Diagnosis Date  . Chronic kidney disease   . Diabetes mellitus    Pt reports diagnosis in June 2011  . Hyperlipidemia   . Hypertension   . Kidney transplant recipient   . 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 Pyelonephritis 06/23/2014  . UTI (urinary tract infection) 01/09/2015  . 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 KIDNEY TRANSPLANT  2007  . RENAL BIOPSY Bilateral 2003  . THYROID LOBECTOMY      No current facility-administered medications for this encounter.    Current Outpatient Medications  Medication Sig Dispense Refill  . cyclobenzaprine (FLEXERIL) 10 MG tablet Take 1 tablet (10 mg total) by mouth 2 (two) times daily as needed for muscle spasms. 20 tablet 0  . ACCU-CHEK SOFTCLIX LANCETS lancets Use to check blood sugar 4 times per day. 150 each 5  . acetaminophen (TYLENOL) 500 MG tablet Take 500 mg by mouth daily as needed.    . calcium carbonate (TUMS) 500 MG chewable tablet Chew 2 tablets by mouth 3 (three) times daily as needed for indigestion. Takes on Monday, wednesdays, and fridays    . cephALEXin  (KEFLEX) 500 MG capsule Take 500 mg by mouth 3 (three) times daily.  0  . cetirizine (ZYRTEC) 10 MG tablet Take 10 mg by mouth daily.    . diazepam (VALIUM) 5 MG tablet Take 1 tablet (5 mg total) by mouth every 6 (six) hours as needed for anxiety (spasms). 6 tablet 0  . dicyclomine (BENTYL) 20 MG tablet Take 1 tablet (20 mg total) by mouth 2 (two) times daily. (Patient not taking: Reported on 06/28/2017) 20 tablet 0  . epoetin alfa (EPOGEN,PROCRIT) 27035 UNIT/ML injection Inject 30,000 Units into the skin every 14 (fourteen) days.    . famotidine (PEPCID) 40 MG tablet Take 1 tablet (40 mg total) by mouth daily. 30 tablet 0  . fluticasone (FLONASE) 50 MCG/ACT nasal spray Place 2 sprays into both nostrils daily. 9.9 g 2  . furosemide (LASIX) 20 MG tablet Take 20 mg by mouth daily as needed. Feet swelling    . gabapentin (NEURONTIN) 100 MG capsule Take 200 mg by mouth daily as needed (for feet).     Marland Kitchen HYDROcodone-acetaminophen (NORCO) 5-325 MG tablet Take 1 tablet by mouth every 6 (six) hours as needed. 12 tablet 0  . hydrOXYzine (ATARAX/VISTARIL) 50 MG tablet Take 50 mg by mouth 2 (two) times daily as needed for itching.     . insulin aspart (NOVOLOG FLEXPEN) 100 UNIT/ML  FlexPen Inject 15 Units into the skin 2 (two) times daily with a meal. 81-150=10 units 51-200=11 units 201-250=12 units 251-100=13 units 301-350=14 units >351=15 u Per sliding scale    . insulin degludec (TRESIBA FLEXTOUCH) 100 UNIT/ML SOPN FlexTouch Pen Inject 1.3 mLs (130 Units total) into the skin daily. And pen needles 1/day 15 pen 11  . loperamide (IMODIUM) 2 MG capsule Take 2 mg by mouth as needed for diarrhea or loose stools. Take 2 tablets as needed for diarrhea.    . mycophenolate (MYFORTIC) 360 MG TBEC Take 720 mg by mouth 2 (two) times daily.     . ondansetron (ZOFRAN) 4 MG tablet Take 1 tablet (4 mg total) by mouth every 8 (eight) hours as needed for nausea or vomiting. 4 tablet 0  . predniSONE (DELTASONE) 5 MG tablet  Take 5 mg by mouth at bedtime.     . prochlorperazine (COMPAZINE) 10 MG tablet Take 1 tablet (10 mg total) by mouth 2 (two) times daily as needed for nausea or vomiting. (Patient not taking: Reported on 06/28/2017) 10 tablet 0  . ranitidine (ZANTAC) 150 MG tablet Take 150-300 mg by mouth daily as needed. Acid indigestion    . simvastatin (ZOCOR) 20 MG tablet Take 20 mg by mouth 3 (three) times a week. MWF.    Marland Kitchen tacrolimus (PROGRAF) 1 MG capsule Take 2 mg by mouth 2 (two) times daily.      Benadryl [diphenhydramine-zinc acetate]; Motrin [ibuprofen]; Banana; Diphenhydramine hcl; Doxycycline; Iron dextran; Milk-related compounds; and Shellfish allergy Family History  Problem Relation Age of Onset  . Arthritis Mother   . Diabetes Mother   . Hypertension Mother    Social History:   reports that she has never smoked. She has never used smokeless tobacco. She reports that she does not drink alcohol or use drugs.   REVIEW OF SYSTEMS : Negative except for the long list of medical problems related to her XXX genetic disorder  Physical Exam:   Blood pressure (!) 161/108, pulse 93, temperature 98 F (36.7 C), temperature source Oral, resp. rate 18, SpO2 100 %. There is no height or weight on file to calculate BMI.  Gen:  WDWN AAF NAD  Neurological: Alert and oriented to person, place, and time. Motor and sensory function is grossly intact  Head: Normocephalic and atraumatic.  Eyes: Conjunctivae are normal. Pupils are equal, round, and reactive to light. No scleral icterus.  Neck: Normal range of motion. Neck supple. No tracheal deviation or thyromegaly present.  Cardiovascular:  SR without murmurs or gallops.  No carotid bruits Breast:  Not examined Respiratory: Effort normal.  No respiratory distress. No chest wall tenderness. Breath sounds normal.  No wheezes, rales or rhonchi.  Abdomen:  Tender in the epigastrium-hockey stick incision in the right lower quadrant from cadaver kidney  transplant GU:  Not examined Musculoskeletal: Normal range of motion. Extremities are nontender. No cyanosis, edema or clubbing noted Lymphadenopathy: No cervical, preauricular, postauricular or axillary adenopathy is present Skin: Skin is warm and dry. No rash noted. No diaphoresis. No erythema. No pallor. Pscyh: Normal mood and affect. Behavior is normal. Judgment and thought content normal.   LABORATORY RESULTS: Results for orders placed or performed during the hospital encounter of 06/29/17 (from the past 48 hour(s))  Lipase, blood     Status: None   Collection Time: 06/29/17  4:55 PM  Result Value Ref Range   Lipase 30 11 - 51 U/L    Comment: Performed at [facility], [place] [address] [place], [place] [address]  Comprehensive metabolic panel     Status: Abnormal   Collection Time: 06/29/17  4:55 PM  Result Value Ref Range   Sodium 137 135 - 145 mmol/L   Potassium 3.5 3.5 - 5.1 mmol/L    Comment: DELTA CHECK NOTED   Chloride 103 101 - 111 mmol/L   CO2 23 22 - 32 mmol/L   Glucose, Bld 240 (H) 65 - 99 mg/dL   BUN 44 (H) 6 - 20 mg/dL   Creatinine, Ser 2.34 (H) 0.44 - 1.00 mg/dL   Calcium 7.3 (L) 8.9 - 10.3 mg/dL   Total Protein 8.0 6.5 - 8.1 g/dL   Albumin 3.8 3.5 - 5.0 g/dL   AST 16 15 - 41 U/L   ALT 15 14 - 54 U/L   Alkaline Phosphatase 73 38 - 126 U/L   Total Bilirubin 0.7 0.3 - 1.2 mg/dL   GFR calc non Af Amer 26 (L) >60 mL/min   GFR calc Af Amer 30 (L) >60 mL/min    Comment: (NOTE) The eGFR has been calculated using the CKD EPI equation. This calculation has not been validated in all clinical situations. eGFR's persistently <60 mL/min signify possible Chronic Kidney Disease.    Anion gap 11 5 - 15    Comment: Performed at [facility], [place] [address], [place], [place] [address]  CBC     Status: Abnormal   Collection Time: 06/29/17  4:55 PM  Result Value Ref Range   WBC 10.1 4.0 - 10.5 K/uL   RBC 3.89 3.87 - 5.11 MIL/uL    Hemoglobin 10.7 (L) 12.0 - 15.0 g/dL   HCT 34.2 (L) 36.0 - 46.0 %   MCV 87.9 78.0 - 100.0 fL   MCH 27.5 26.0 - 34.0 pg   MCHC 31.3 30.0 - 36.0 g/dL   RDW 15.2 11.5 - 15.5 %   Platelets 293 150 - 400 K/uL    Comment: Performed at [facility], [place] [address] [place], [place] [address]  Urinalysis, Routine w reflex microscopic     Status: Abnormal   Collection Time: 06/29/17  5:04 PM  Result Value Ref Range   Color, Urine YELLOW YELLOW   APPearance HAZY (A) CLEAR   Specific Gravity, Urine 1.016 1.005 - 1.030   pH 5.0 5.0 - 8.0   Glucose, UA NEGATIVE NEGATIVE mg/dL   Hgb urine dipstick SMALL (A) NEGATIVE   Bilirubin Urine NEGATIVE NEGATIVE   Ketones, ur NEGATIVE NEGATIVE mg/dL   Protein, ur 100 (A) NEGATIVE mg/dL   Nitrite NEGATIVE NEGATIVE   Leukocytes, UA NEGATIVE NEGATIVE   RBC / HPF 0-5 0 - 5 RBC/hpf   WBC, UA 0-5 0 - 5 WBC/hpf   Bacteria, UA NONE SEEN NONE SEEN   Squamous Epithelial / LPF NONE SEEN NONE SEEN   Mucus PRESENT     Comment: Performed at [facility], [place] [address], [place], [place] [address]     RADIOLOGY RESULTS: US Abdomen Limited Ruq  Result Date: 06/29/2017 CLINICAL DATA:  RIGHT upper quadrant pain. History of kidney stones, renal transplant, melanoma. EXAM: ULTRASOUND ABDOMEN LIMITED RIGHT UPPER QUADRANT COMPARISON:  Abdominal ultrasound June 28, 2017. CT abdomen and pelvis December 10, 2016. FINDINGS: Gallbladder: Multiple echogenic gallstones with acoustic shadowing measuring to 7 mm. No gallbladder wall thickening or pericholecystic fluid. No sonographic Murphy's sign elicited. Common bile duct: Diameter: 6 mm, top-normal.  No choledocholithiasis. Liver: Two echogenic masses measuring  to 2 cm, unchanged from yesterday. Heterogeneous echotexture. Vein is patent on color Doppler imaging with normal direction of blood flow towards the liver. IMPRESSION: 1. Stable cholelithiasis without sonographic findings of acute  cholecystitis. 2. Two small echogenic liver lesions could reflect hemangiomas though, would be better characterized on NONEMERGENT MRI with and without contrast, when patient's renal function improves. Electronically Signed   By: Elon Alas M.D.   On: 06/29/2017 17:14   US Abdomen Limited Ruq  Result Date: 06/28/2017 CLINICAL DATA:  Abdominal pain, nausea and vomiting EXAM: ULTRASOUND ABDOMEN LIMITED RIGHT UPPER QUADRANT COMPARISON:  12/10/2016 FINDINGS: Gallbladder: Small echogenic shadowing gallstones, largest measures 7 mm. These appear mobile. No associated wall thickening or pericholecystic fluid. Wall thickness measures 2 mm. No Murphy's sign elicited. Common bile duct: Diameter: 4.5 mm Liver: Mildly heterogeneous increased echogenicity. Slight diffuse intrahepatic biliary prominence, nonspecific. There are 2 adjacent ill-defined echogenic areas or lesions in the right hepatic lobe 1 measuring 1.5 x 1.9 x 1.7 cm and a second measuring 1.5 x 1.4 x 1.8 cm. No available contrast-enhanced studies of the liver to accurately assess for a previous hepatic abnormality. Further evaluation will be recommended with abdominal MRI without and with contrast. Portal vein is patent on color Doppler imaging with normal direction of blood flow towards the liver. IMPRESSION: Cholelithiasis, measuring up to 7 mm. No signs of cholecystitis. Mildly heterogeneous liver with biliary prominence and 2 ill-defined echogenic right hepatic lesions largest measures 1.9 cm remaining indeterminate. These warrant further evaluation with nonemergent abdominal MRI without and with contrast. Electronically Signed   By: Jerilynn Mages.  Shick M.D.   On: 06/28/2017 14:57    Problem List: Patient Active Problem List   Diagnosis Date Noted  . Type 1 diabetes mellitus with complication, uncontrolled ([place]) 05/25/2015  . Intractable nausea and vomiting 01/09/2015  . Renal transplant recipient   . Immunosuppressed status ([place])   . 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 CHRONIC KIDNEY DISEASE STAGE II (MILD) 01/04/2010  . OVARIAN FAILURE, PREMATURE 03/11/2009  . 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 SECONDARY HYPERPARATHYROIDISM 12/05/2007  . OBESITY 09/25/2007  . Anemia in chronic renal disease 09/25/2007  . LEARNING DISABILITY 09/25/2007  . Essential hypertension, benign 09/25/2007  . 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      Matt B. Hassell Done, MD, [facility] [phone] beeper [phone]  06/29/2017 7:21 PM

## 2017-06-29 NOTE — ED Provider Notes (Signed)
[place] DEPT Provider Note   CSN: [id number] Arrival date & time: 06/29/17  1332     History   Chief Complaint Chief Complaint  Patient presents with  . Abdominal Pain    HPI [name] is a 32 y.o. female female with a history of diabetes type 1, hypertension, hyperlipidemia, prior kidney transplant (2007), CKD who presents the emergency department today for recurrent abdominal pain.  Patient was seen by myself yesterday and diagnosed with cholelithiasis.  Briefly patient presented yesterday with colicky upper abdominal pain after fatty/greasy foods with associated nonbilious, nonbloody emesis.  Vital signs were reassuring.  She did have right upper quadrant tenderness on palpation.  Patient's labs were significant for leukocytosis of 11.5.  Patient LFTs were within normal limits.  Ultrasound showed gallstones with the largest measuring 7 mm without associated wall thickening (measured 104mm) or pericholecystic fluid.  Patient's pain was controlled in the department and she was p.o. challenged with toleration.  She was discharged home with pain medication as well as nausea medication.  Patient was given a referral to [facility] surgery for follow-up as well as strict return precautions.   Patient notes that yesterday evening around 9pm after eating crackers and juice she started having recurrent right upper quadrant abdominal pain as well as nausea.  She states that the pain has been constant, without radiation and associated with constant nausea as well as 3 episodes of nonbilious, nonbloody emesis.  Patient's last p.o. intake was at 11 AM this morning where she tried have crackers again but was unable to tolerate this.  She states that she try to take her home Zofran as well as able to hold this down.  Patient's last episode of emesis was at 4 PM today.  Patient denies any associated fever, chills, chest pain, shortness of breath, abdominal pain, flank  pain, diarrhea or urinary symptoms.  HPI  Past Medical History:  Diagnosis Date  . Chronic kidney disease   . Diabetes mellitus    Pt reports diagnosis in June 2011  . Hyperlipidemia   . Hypertension   . Kidney transplant recipient   . 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 Pyelonephritis 06/23/2014  . UTI (urinary tract infection) 01/09/2015  . 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 Type 1 diabetes mellitus with complication, uncontrolled ([place]) 05/25/2015  . Intractable nausea and vomiting 01/09/2015  . Renal transplant recipient   . Immunosuppressed status ([place])   . 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 CHRONIC KIDNEY DISEASE STAGE II (M[facility]) 01/04/2010  . OVARIAN FAILURE, PREMATURE 03/11/2009  . 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 SECONDARY HYPERPARATHYROIDISM 12/05/2007  . OBESITY 09/25/2007  . Anemia in chronic renal disease 09/25/2007  . LEARNING DISABILITY 09/25/2007  . Essential hypertension, benign 09/25/2007  . KIDNEY TRANSPLANTATION, HX OF 09/25/2007    Past Surgical History:  Procedure Laterality Date  . KIDNEY TRANSPLANT  2007  . RENAL BIOPSY Bilateral 2003  . THYROID LOBECTOMY       OB History   None      Home Medications    Prior to Admission medications   Medication Sig Start Date End Date Taking? Authorizing Provider  ACCU-CHEK SOFTCLIX LANCETS lancets Use to check blood sugar 4 times per day. 12/29/15  Renato Shin, MD  acetaminophen (TYLENOL) 500 MG tablet Take 500 mg by mouth daily as needed.    [provider]  calcium carbonate (TUMS) 500 MG chewable tablet Chew 2 tablets by mouth 3 (three) times daily as needed for indigestion. Takes on Monday, wednesdays, and fridays    [provider]  cephALEXin (KEFLEX) 500 MG capsule Take 500 mg  by mouth 3 (three) times daily. 06/23/17   [provider]  cetirizine (ZYRTEC) 10 MG tablet Take 10 mg by mouth daily.    [provider]  cyclobenzaprine (FLEXERIL) 10 MG tablet Take 1 tablet (10 mg total) by mouth 2 (two) times daily as needed for muscle spasms. 07/30/16   Montine Circle, PA-C  diazepam (VALIUM) 5 MG tablet Take 1 tablet (5 mg total) by mouth every 6 (six) hours as needed for anxiety (spasms). 05/11/15   Elnora Morrison, MD  dicyclomine (BENTYL) 20 MG tablet Take 1 tablet (20 mg total) by mouth 2 (two) times daily. Patient not taking: Reported on 06/28/2017 01/13/17   Carlisle Cater, PA-C  epoetin alfa (EPOGEN,PROCRIT) 06269 UNIT/ML injection Inject 30,000 Units into the skin every 14 (fourteen) days.    [provider]  famotidine (PEPCID) 40 MG tablet Take 1 tablet (40 mg total) by mouth daily. 01/09/17   Caccavale, Sophia, PA-C  fluticasone (FLONASE) 50 MCG/ACT nasal spray Place 2 sprays into both nostrils daily. 07/20/15   Muthersbaugh, Jarrett Soho, PA-C  furosemide (LASIX) 20 MG tablet Take 20 mg by mouth daily as needed. Feet swelling    [provider]  gabapentin (NEURONTIN) 100 MG capsule Take 200 mg by mouth daily as needed (for feet).  11/12/16   [provider]  HYDROcodone-acetaminophen (NORCO) 5-325 MG tablet Take 1 tablet by mouth every 6 (six) hours as needed. 06/28/17   Jeremias Broyhill, Barth Kirks, PA-C  hydrOXYzine (ATARAX/VISTARIL) 50 MG tablet Take 50 mg by mouth 2 (two) times daily as needed for itching.     [provider]  insulin aspart (NOVOLOG FLEXPEN) 100 UNIT/ML FlexPen Inject 15 Units into the skin 2 (two) times daily with a meal. 81-150=10 units 51-200=11 units 201-250=12 units 251-100=13 units 301-350=14 units >351=15 u Per sliding scale    [provider]  insulin degludec (TRESIBA FLEXTOUCH) 100 UNIT/ML SOPN FlexTouch Pen Inject 1.3 mLs (130 Units total) into the skin daily. And pen needles 1/day 07/22/16    Renato Shin, MD  loperamide (IMODIUM) 2 MG capsule Take 2 mg by mouth as needed for diarrhea or loose stools. Take 2 tablets as needed for diarrhea.    [provider]  mycophenolate (MYFORTIC) 360 MG TBEC Take 720 mg by mouth 2 (two) times daily.     [provider]  ondansetron (ZOFRAN) 4 MG tablet Take 1 tablet (4 mg total) by mouth every 8 (eight) hours as needed for nausea or vomiting. 06/28/17   Laron Angelini, Barth Kirks, PA-C  predniSONE (DELTASONE) 5 MG tablet Take 5 mg by mouth at bedtime.     [provider]  prochlorperazine (COMPAZINE) 10 MG tablet Take 1 tablet (10 mg total) by mouth 2 (two) times daily as needed for nausea or vomiting. Patient not taking: Reported on 06/28/2017 01/13/17   Carlisle Cater, PA-C  ranitidine (ZANTAC) 150 MG tablet Take 150-300 mg by mouth daily as needed. Acid indigestion    [provider]  simvastatin (ZOCOR) 20 MG tablet Take 20 mg by mouth 3 (three) times a week. MWF.    [provider]  tacrolimus (PROGRAF) 1 MG capsule Take 2 mg by mouth 2 (two) times daily.     [provider]    Family History Family History  Problem Relation Age of Onset  . Arthritis Mother   . Diabetes Mother   . Hypertension Mother     Social History Social History   Tobacco Use  . Smoking status: Never Smoker  . Smokeless tobacco: Never Used  Substance Use Topics  . Alcohol use: No  . Drug use: No     Allergies   Benadryl [diphenhydramine-zinc acetate]; Motrin [ibuprofen]; Banana; Diphenhydramine hcl; Doxycycline; Iron dextran; Milk-related compounds; and Shellfish allergy   Review of Systems Review of Systems  All other systems reviewed and are negative.    Physical Exam Updated Vital Signs BP (!) 155/115 (BP Location: Right Arm)   Pulse 100   Temp 98 F (36.7 C) (Oral)   Resp 20   SpO2 100%   Physical Exam  Constitutional: She appears well-developed and well-nourished.  Appears uncomfortable    HENT:  Head: Normocephalic and atraumatic.  Right Ear: External ear normal.  Left Ear: External ear normal.  Nose: Nose normal.  Mouth/Throat: Uvula is midline, oropharynx is clear and moist and mucous membranes are normal. No tonsillar exudate.  Eyes: Pupils are equal, round, and reactive to light. Right eye exhibits no discharge. Left eye exhibits no discharge. No scleral icterus.  Neck: Trachea normal. Neck supple. No spinous process tenderness present. No neck rigidity. Normal range of motion present.  Cardiovascular: Normal rate, regular rhythm and intact distal pulses.  No murmur heard. Pulses:      Radial pulses are 2+ on the right side, and 2+ on the left side.       Dorsalis pedis pulses are 2+ on the right side, and 2+ on the left side.       Posterior tibial pulses are 2+ on the right side, and 2+ on the left side.  No lower extremity swelling or edema. Calves symmetric in size bilaterally.  Pulmonary/Chest: Effort normal and breath sounds normal. She exhibits no tenderness.  Abdominal: Soft. Bowel sounds are normal. There is tenderness in the right upper quadrant. There is positive Murphy's sign. There is no rigidity, no rebound, no guarding and no CVA tenderness.  Musculoskeletal: She exhibits no edema.  Lymphadenopathy:    She has no cervical adenopathy.  Neurological: She is alert.  Skin: Skin is warm and dry. No rash noted. She is not diaphoretic.  Psychiatric: She has a normal mood and affect.  Nursing note and vitals reviewed.    ED Treatments / Results  Labs (all labs ordered are listed, but only abnormal results are displayed) Labs Reviewed  COMPREHENSIVE METABOLIC PANEL - Abnormal; Notable for the following components:      Result Value   Glucose, Bld 240 (*)    BUN 44 (*)    Creatinine, Ser 2.34 (*)    Calcium 7.3 (*)    GFR calc non Af Amer 26 (*)    GFR calc Af Amer 30 (*)    All other components within normal limits  CBC - Abnormal; Notable for the  following components:   Hemoglobin 10.7 (*)    HCT 34.2 (*)    All other components within normal limits  URINALYSIS, ROUTINE W REFLEX MICROSCOPIC - Abnormal; Notable for the following components:   APPearance HAZY (*)    Hgb urine dipstick SMALL (*)    Protein, ur 100 (*)  All other components within normal limits  LIPASE, BLOOD  PREGNANCY, URINE  I-STAT BETA HCG BLOOD, ED ([facility], [facility], [facility] ONLY)    EKG None  Radiology US Abdomen Limited Ruq  Result Date: 06/28/2017 CLINICAL DATA:  Abdominal pain, nausea and vomiting EXAM: ULTRASOUND ABDOMEN LIMITED RIGHT UPPER QUADRANT COMPARISON:  12/10/2016 FINDINGS: Gallbladder: Small echogenic shadowing gallstones, largest measures 7 mm. These appear mobile. No associated wall thickening or pericholecystic fluid. Wall thickness measures 2 mm. No Murphy's sign elicited. Common bile duct: Diameter: 4.5 mm Liver: Mildly heterogeneous increased echogenicity. Slight diffuse intrahepatic biliary prominence, nonspecific. There are 2 adjacent ill-defined echogenic areas or lesions in the right hepatic lobe 1 measuring 1.5 x 1.9 x 1.7 cm and a second measuring 1.5 x 1.4 x 1.8 cm. No available contrast-enhanced studies of the liver to accurately assess for a previous hepatic abnormality. Further evaluation will be recommended with abdominal MRI without and with contrast. Portal vein is patent on color Doppler imaging with normal direction of blood flow towards the liver. IMPRESSION: Cholelithiasis, measuring up to 7 mm. No signs of cholecystitis. Mildly heterogeneous liver with biliary prominence and 2 ill-defined echogenic right hepatic lesions largest measures 1.9 cm remaining indeterminate. These warrant further evaluation with nonemergent abdominal MRI without and with contrast. Electronically Signed   By: Jerilynn Mages.  Shick M.D.   On: 06/28/2017 14:57    Procedures Procedures (including critical care time)  Medications Ordered in ED Medications  HYDROmorphone  (DILAUDID) injection 1 mg (has no administration in time range)  sodium chloride 0.9 % bolus 1,000 mL (has no administration in time range)  ondansetron (ZOFRAN) injection 4 mg (has no administration in time range)     Initial Impression / Assessment and Plan / ED Course  I have reviewed the triage vital signs and the nursing notes.  Pertinent labs & imaging results that were available during my care of the patient were reviewed by me and considered in my medical decision making (see chart for details).     32 y.o. female female with a history of diabetes type 1, hypertension, hyperlipidemia, prior kidney transplant (2007), CKD who presents the emergency department today for recurrent abdominal pain.  Patient was seen by myself yesterday and diagnosed with cholelithiasis.  Briefly patient presented yesterday with colicky upper abdominal pain after fatty/greasy foods with associated nonbilious, nonbloody emesis.  Vital signs were reassuring.  She did have right upper quadrant tenderness on palpation.  Patient's labs were significant for leukocytosis of 11.5.  Patient LFTs were within normal limits.  Ultrasound showed gallstones with the largest measuring 7 mm without associated wall thickening (measured 77mm) or pericholecystic fluid.  Patient's pain was controlled in the department and she was p.o. challenged with toleration.  She was discharged home with pain medication as well as nausea medication.  Patient was given a referral to [facility] surgery for follow-up as well as strict return precautions.   Patient presenting today with recurrent abdominal pain that began around 9 PM after a meal with associated nausea and nonbilious nonbloody emesis.  Patient appears uncomfortable on exam.  Vital signs are without fever.  Patient has tenderness palpation of the right upper quadrant with positive Murphy sign.  Will repeat patient's lab work and obtain right upper quadrant ultrasound.  IV fluids,  nausea and pain medication administered.   Patient without leukocytosis.  Normal LFTs.  Bilirubin within normal limits.  Right upper quadrant ultrasound with stable cholelithiasis without pericholecystic fluid or gallbladder wall thickening.  Patient's  common bile duct has increased from 4.42mm to 6 mm today.  There is no evidence of choledocholithiasis.  Repeat abdominal exam patient with persistent tenderness palpation of the right upper quadrant despite Dilaudid therapy.  Nausea is currently controlled.  Will consult general surgery given patient ongoing pain and repeat presentation for this.  Spoke with Dr. Hassell Done of [facility] surgery.  He will come and evaluate the patient.  Appreciate this.  Family updated on plan.  Dr. Hassell Done has seen the patient and will admit.  Family in agreement with plan.  Final Clinical Impressions(s) / ED Diagnoses   Final diagnoses:  RUQ abdominal pain  Cholecystitis    ED Discharge Orders    None       Lorelle Gibbs 06/29/17 2025    Virgel Manifold, MD 06/30/17 1513

## 2017-06-29 NOTE — ED Triage Notes (Signed)
Per EMS pt seen for same yesterday; diagnosed with cholelithiasis; pt verbalizes continued n/v and pain. Given 4 mg of zofran IV with EMS.

## 2017-06-30 ENCOUNTER — Ambulatory Visit: Payer: Medicare Other | Admitting: Nurse Practitioner

## 2017-06-30 ENCOUNTER — Encounter (HOSPITAL_COMMUNITY): Admission: EM | Disposition: A | Discharge status: Home / Self Care | Payer: Self-pay | Source: Home / Self Care

## 2017-06-30 ENCOUNTER — Ambulatory Visit: Payer: Medicare Other | Admitting: Orthotics

## 2017-06-30 ENCOUNTER — Other Ambulatory Visit: Payer: Self-pay

## 2017-06-30 ENCOUNTER — Inpatient Hospital Stay (HOSPITAL_COMMUNITY): Payer: Medicare Other | Admitting: Anesthesiology

## 2017-06-30 ENCOUNTER — Inpatient Hospital Stay (HOSPITAL_COMMUNITY): Payer: Medicare Other

## 2017-06-30 ENCOUNTER — Encounter (HOSPITAL_COMMUNITY): Payer: Self-pay | Admitting: Anesthesiology

## 2017-06-30 HISTORY — PX: CHOLECYSTECTOMY: SHX55

## 2017-06-30 LAB — COMPREHENSIVE METABOLIC PANEL
ALK PHOS: 67 U/L (ref 38–126)
ALT: 14 U/L (ref 14–54)
AST: 16 U/L (ref 15–41)
Albumin: 3.4 g/dL — ABNORMAL LOW (ref 3.5–5.0)
Anion gap: 12 (ref 5–15)
BUN: 38 mg/dL — ABNORMAL HIGH (ref 6–20)
CALCIUM: 7 mg/dL — AB (ref 8.9–10.3)
CO2: 22 mmol/L (ref 22–32)
Chloride: 106 mmol/L (ref 101–111)
Creatinine, Ser: 1.87 mg/dL — ABNORMAL HIGH (ref 0.44–1.00)
GFR calc non Af Amer: 34 mL/min — ABNORMAL LOW (ref >60)
GFR, EST AFRICAN AMERICAN: 40 mL/min — AB (ref >60)
Glucose, Bld: 98 mg/dL (ref 65–99)
Potassium: 3.9 mmol/L (ref 3.5–5.1)
SODIUM: 140 mmol/L (ref 135–145)
Total Bilirubin: 0.7 mg/dL (ref 0.3–1.2)
Total Protein: 7.4 g/dL (ref 6.5–8.1)

## 2017-06-30 LAB — GLUCOSE, CAPILLARY
GLUCOSE-CAPILLARY: 108 mg/dL — AB (ref 65–99)
GLUCOSE-CAPILLARY: 122 mg/dL — AB (ref 65–99)
Glucose-Capillary: 91 mg/dL (ref 65–99)
Glucose-Capillary: 149 mg/dL — ABNORMAL HIGH (ref 65–99)
Glucose-Capillary: 83 mg/dL (ref 65–99)
Glucose-Capillary: 95 mg/dL (ref 65–99)
Glucose-Capillary: 162 mg/dL — ABNORMAL HIGH (ref 65–99)
Glucose-Capillary: 297 mg/dL — ABNORMAL HIGH (ref 65–99)
Glucose-Capillary: 353 mg/dL — ABNORMAL HIGH (ref 65–99)

## 2017-06-30 LAB — CBC
HEMATOCRIT: 32 % — AB (ref 36.0–46.0)
Hemoglobin: 10 g/dL — ABNORMAL LOW (ref 12.0–15.0)
MCH: 27.9 pg (ref 26.0–34.0)
MCHC: 31.3 g/dL (ref 30.0–36.0)
MCV: 89.1 fL (ref 78.0–100.0)
Platelets: 276 10*3/uL (ref 150–400)
RBC: 3.59 MIL/uL — ABNORMAL LOW (ref 3.87–5.11)
RDW: 15.3 % (ref 11.5–15.5)
WBC: 9.8 10*3/uL (ref 4.0–10.5)

## 2017-06-30 LAB — MRSA PCR SCREENING: MRSA BY PCR: NEGATIVE

## 2017-06-30 LAB — MAGNESIUM: Magnesium: 1.5 mg/dL — ABNORMAL LOW (ref 1.7–2.4)

## 2017-06-30 LAB — PHOSPHORUS: PHOSPHORUS: 4.4 mg/dL (ref 2.5–4.6)

## 2017-06-30 SURGERY — LAPAROSCOPIC CHOLECYSTECTOMY WITH INTRAOPERATIVE CHOLANGIOGRAM
Anesthesia: General

## 2017-06-30 MED ORDER — SCOPOLAMINE 1 MG/3DAYS TD PT72
MEDICATED_PATCH | TRANSDERMAL | Status: DC | PRN
  Administered 2017-06-30: 1 via TRANSDERMAL

## 2017-06-30 MED ORDER — DEXTROSE 50 % IV SOLN
INTRAVENOUS | Status: DC | PRN
  Administered 2017-06-30: 25 mL via INTRAVENOUS

## 2017-06-30 MED ORDER — DEXTROSE 50 % IV SOLN
INTRAVENOUS | Status: AC
  Filled 2017-06-30: qty 50

## 2017-06-30 MED ORDER — SCOPOLAMINE 1 MG/3DAYS TD PT72
MEDICATED_PATCH | TRANSDERMAL | Status: AC
  Filled 2017-06-30: qty 1

## 2017-06-30 MED ORDER — BUPIVACAINE HCL (PF) 0.25 % IJ SOLN
INTRAMUSCULAR | Status: DC | PRN
  Administered 2017-06-30: 15 mL

## 2017-06-30 MED ORDER — SUCCINYLCHOLINE CHLORIDE 200 MG/10ML IV SOSY
PREFILLED_SYRINGE | INTRAVENOUS | Status: DC | PRN
  Administered 2017-06-30: 120 mg via INTRAVENOUS

## 2017-06-30 MED ORDER — SUGAMMADEX SODIUM 200 MG/2ML IV SOLN
INTRAVENOUS | Status: DC | PRN
  Administered 2017-06-30: 200 mg via INTRAVENOUS

## 2017-06-30 MED ORDER — MIDAZOLAM HCL 2 MG/2ML IJ SOLN: 0.5000 mg | Freq: Once | INTRAMUSCULAR | Status: DC | PRN

## 2017-06-30 MED ORDER — BUPIVACAINE HCL (PF) 0.25 % IJ SOLN
INTRAMUSCULAR | Status: AC
  Filled 2017-06-30: qty 30

## 2017-06-30 MED ORDER — SUGAMMADEX SODIUM 200 MG/2ML IV SOLN
INTRAVENOUS | Status: AC
  Filled 2017-06-30: qty 4

## 2017-06-30 MED ORDER — ROCURONIUM BROMIDE 10 MG/ML (PF) SYRINGE
PREFILLED_SYRINGE | INTRAVENOUS | Status: DC | PRN
  Administered 2017-06-30: 10 mg via INTRAVENOUS
  Administered 2017-06-30: 40 mg via INTRAVENOUS
  Administered 2017-06-30: 20 mg via INTRAVENOUS

## 2017-06-30 MED ORDER — PROMETHAZINE HCL 25 MG/ML IJ SOLN: 6.2500 mg | INTRAMUSCULAR | Status: DC | PRN

## 2017-06-30 MED ORDER — IOPAMIDOL (ISOVUE-300) INJECTION 61%
INTRAVENOUS | Status: AC
  Filled 2017-06-30: qty 50

## 2017-06-30 MED ORDER — PROPOFOL 10 MG/ML IV BOLUS
INTRAVENOUS | Status: DC | PRN
  Administered 2017-06-30: 170 mg via INTRAVENOUS

## 2017-06-30 MED ORDER — MIDAZOLAM HCL 2 MG/2ML IJ SOLN
INTRAMUSCULAR | Status: AC
  Filled 2017-06-30: qty 2

## 2017-06-30 MED ORDER — SUCCINYLCHOLINE CHLORIDE 200 MG/10ML IV SOSY
PREFILLED_SYRINGE | INTRAVENOUS | Status: AC
  Filled 2017-06-30: qty 20

## 2017-06-30 MED ORDER — PROPOFOL 10 MG/ML IV BOLUS
INTRAVENOUS | Status: AC
  Filled 2017-06-30: qty 20

## 2017-06-30 MED ORDER — PROMETHAZINE HCL 25 MG/ML IJ SOLN
INTRAMUSCULAR | Status: AC
  Filled 2017-06-30: qty 1

## 2017-06-30 MED ORDER — DEXAMETHASONE SODIUM PHOSPHATE 10 MG/ML IJ SOLN
INTRAMUSCULAR | Status: DC | PRN
  Administered 2017-06-30: 10 mg via INTRAVENOUS

## 2017-06-30 MED ORDER — LACTATED RINGERS IR SOLN
Status: DC | PRN
  Administered 2017-06-30: 1000 mL

## 2017-06-30 MED ORDER — LIDOCAINE HCL 2 % IJ SOLN
INTRAMUSCULAR | Status: AC
  Filled 2017-06-30: qty 20

## 2017-06-30 MED ORDER — FENTANYL CITRATE (PF) 100 MCG/2ML IJ SOLN
INTRAMUSCULAR | Status: DC | PRN
  Administered 2017-06-30: 50 ug via INTRAVENOUS
  Administered 2017-06-30: 100 ug via INTRAVENOUS
  Administered 2017-06-30: 50 ug via INTRAVENOUS

## 2017-06-30 MED ORDER — HYDROCORTISONE NA SUCCINATE PF 100 MG IJ SOLR
25.0000 mg | Freq: Three times a day (TID) | INTRAMUSCULAR | Status: DC
  Administered 2017-06-30 – 2017-07-01 (×2): 25 mg via INTRAVENOUS
  Filled 2017-06-30 (×3): qty 0.5

## 2017-06-30 MED ORDER — MEPERIDINE HCL 50 MG/ML IJ SOLN: 6.2500 mg | INTRAMUSCULAR | Status: DC | PRN

## 2017-06-30 MED ORDER — FENTANYL CITRATE (PF) 100 MCG/2ML IJ SOLN
INTRAMUSCULAR | Status: AC
  Filled 2017-06-30: qty 2

## 2017-06-30 MED ORDER — INSULIN ASPART 100 UNIT/ML ~~LOC~~ SOLN
0.0000 [IU] | SUBCUTANEOUS | Status: DC
  Administered 2017-06-30: 15 [IU] via SUBCUTANEOUS
  Administered 2017-06-30 – 2017-07-01 (×2): 8 [IU] via SUBCUTANEOUS
  Administered 2017-07-01: 3 [IU] via SUBCUTANEOUS

## 2017-06-30 MED ORDER — ONDANSETRON HCL 4 MG/2ML IJ SOLN
INTRAMUSCULAR | Status: AC
  Filled 2017-06-30: qty 2

## 2017-06-30 MED ORDER — ROCURONIUM BROMIDE 10 MG/ML (PF) SYRINGE
PREFILLED_SYRINGE | INTRAVENOUS | Status: AC
  Filled 2017-06-30: qty 5

## 2017-06-30 MED ORDER — INSULIN ASPART 100 UNIT/ML ~~LOC~~ SOLN
0.0000 [IU] | Freq: Three times a day (TID) | SUBCUTANEOUS | Status: DC
  Administered 2017-06-30: 3 [IU] via SUBCUTANEOUS

## 2017-06-30 MED ORDER — LACTATED RINGERS IV SOLN
INTRAVENOUS | Status: DC | PRN
  Administered 2017-06-30: 1000 mL via INTRAVENOUS
  Administered 2017-06-30: 10:00:00 via INTRAVENOUS

## 2017-06-30 MED ORDER — LIDOCAINE 2% (20 MG/ML) 5 ML SYRINGE
INTRAMUSCULAR | Status: DC | PRN
  Administered 2017-06-30: 100 mg via INTRAVENOUS

## 2017-06-30 MED ORDER — CEFAZOLIN SODIUM-DEXTROSE 2-4 GM/100ML-% IV SOLN
2.0000 g | INTRAVENOUS | Status: AC
  Administered 2017-06-30: 2 g via INTRAVENOUS
  Filled 2017-06-30 (×2): qty 100

## 2017-06-30 MED ORDER — HYDROMORPHONE HCL 1 MG/ML IJ SOLN
0.2500 mg | INTRAMUSCULAR | Status: DC | PRN
  Administered 2017-06-30 (×2): 0.5 mg via INTRAVENOUS

## 2017-06-30 MED ORDER — 0.9 % SODIUM CHLORIDE (POUR BTL) OPTIME
TOPICAL | Status: DC | PRN
  Administered 2017-06-30: 1000 mL

## 2017-06-30 MED ORDER — HYDROMORPHONE HCL 1 MG/ML IJ SOLN
INTRAMUSCULAR | Status: AC
  Administered 2017-06-30: 0.5 mg via INTRAVENOUS
  Filled 2017-06-30: qty 1

## 2017-06-30 MED ORDER — IOPAMIDOL (ISOVUE-300) INJECTION 61%
INTRAVENOUS | Status: DC | PRN
  Administered 2017-06-30: 6 mL

## 2017-06-30 MED ORDER — MIDAZOLAM HCL 5 MG/5ML IJ SOLN
INTRAMUSCULAR | Status: DC | PRN
  Administered 2017-06-30: 2 mg via INTRAVENOUS

## 2017-06-30 MED ORDER — LIDOCAINE 2% (20 MG/ML) 5 ML SYRINGE
INTRAMUSCULAR | Status: AC
  Filled 2017-06-30: qty 5

## 2017-06-30 MED ORDER — DEXAMETHASONE SODIUM PHOSPHATE 10 MG/ML IJ SOLN
INTRAMUSCULAR | Status: AC
  Filled 2017-06-30: qty 1

## 2017-06-30 SURGICAL SUPPLY — 38 items
APPLIER CLIP 5 13 M/L LIGAMAX5 (MISCELLANEOUS) ×2
APPLIER CLIP ROT 10 11.4 M/L (STAPLE) ×2
BLADE 10 SAFETY STRL DISP (BLADE) ×2 IMPLANT
CABLE HIGH FREQUENCY MONO STRZ (ELECTRODE) ×2 IMPLANT
CATH REDDICK CHOLANGI 4FR 50CM (CATHETERS) IMPLANT
CHLORAPREP W/TINT 26ML (MISCELLANEOUS) ×2 IMPLANT
CLIP APPLIE 5 13 M/L LIGAMAX5 (MISCELLANEOUS) ×1 IMPLANT
CLIP APPLIE ROT 10 11.4 M/L (STAPLE) ×1 IMPLANT
COVER MAYO STAND STRL (DRAPES) ×2 IMPLANT
COVER SURGICAL LIGHT HANDLE (MISCELLANEOUS) ×2 IMPLANT
DERMABOND ADVANCED (GAUZE/BANDAGES/DRESSINGS) ×1
DERMABOND ADVANCED .7 DNX12 (GAUZE/BANDAGES/DRESSINGS) ×1 IMPLANT
DRAPE C-ARM 42X120 X-RAY (DRAPES) ×2 IMPLANT
ELECT REM PT RETURN 15FT ADLT (MISCELLANEOUS) ×2 IMPLANT
GLOVE BIOGEL PI IND STRL 7.0 (GLOVE) ×5 IMPLANT
GLOVE BIOGEL PI IND STRL 7.5 (GLOVE) ×2 IMPLANT
GLOVE BIOGEL PI INDICATOR 7.0 (GLOVE) ×5
GLOVE BIOGEL PI INDICATOR 7.5 (GLOVE) ×2
GLOVE ECLIPSE 7.5 STRL STRAW (GLOVE) ×2 IMPLANT
GOWN STRL REUS W/ TWL LRG LVL3 (GOWN DISPOSABLE) ×2 IMPLANT
GOWN STRL REUS W/TWL LRG LVL3 (GOWN DISPOSABLE) ×2
GOWN STRL REUS W/TWL XL LVL3 (GOWN DISPOSABLE) ×6 IMPLANT
HEMOSTAT SNOW SURGICEL 2X4 (HEMOSTASIS) ×2 IMPLANT
HEMOSTAT SURGICEL 4X8 (HEMOSTASIS) IMPLANT
KIT BASIN OR (CUSTOM PROCEDURE TRAY) ×2 IMPLANT
POUCH RETRIEVAL ECOSAC 10 (ENDOMECHANICALS) IMPLANT
POUCH RETRIEVAL ECOSAC 10MM (ENDOMECHANICALS)
SCISSORS LAP 5X35 DISP (ENDOMECHANICALS) ×2 IMPLANT
SET CHOLANGIOGRAPH MIX (MISCELLANEOUS) ×2 IMPLANT
SET IRRIG TUBING LAPAROSCOPIC (IRRIGATION / IRRIGATOR) ×2 IMPLANT
SLEEVE XCEL OPT CAN 5 100 (ENDOMECHANICALS) ×2 IMPLANT
SUT MNCRL AB 4-0 PS2 18 (SUTURE) ×4 IMPLANT
TOWEL OR 17X26 10 PK STRL BLUE (TOWEL DISPOSABLE) ×2 IMPLANT
TRAY LAPAROSCOPIC (CUSTOM PROCEDURE TRAY) ×2 IMPLANT
TROCAR BLADELESS OPT 5 100 (ENDOMECHANICALS) ×2 IMPLANT
TROCAR XCEL BLUNT TIP 100MML (ENDOMECHANICALS) ×2 IMPLANT
TROCAR XCEL NON-BLD 11X100MML (ENDOMECHANICALS) ×2 IMPLANT
TUBING INSUF HEATED (TUBING) ×2 IMPLANT

## 2017-06-30 NOTE — Transfer of Care (Signed)
Immediate Anesthesia Transfer of Care Note  Patient: [name]  Procedure(s) Performed: LAPAROSCOPIC CHOLECYSTECTOMY WITH INTRAOPERATIVE CHOLANGIOGRAM (N/A )  Patient Location: PACU  Anesthesia Type:General  Level of Consciousness: awake, alert  and oriented  Airway & Oxygen Therapy: Patient Spontanous Breathing and Patient connected to face mask oxygen  Post-op Assessment: Report given to RN  Post vital signs: Reviewed and stable  Last Vitals:  Vitals Value Taken Time  BP 130/86 06/30/2017  1:45 PM  Temp    Pulse 88 06/30/2017  1:46 PM  Resp 16 06/30/2017  1:46 PM  SpO2 100 % 06/30/2017  1:46 PM  Vitals shown include unvalidated device data.  Last Pain:  Vitals:   06/30/17 1050  TempSrc:   PainSc: 8       Patients Stated Pain Goal: 5 (38/18/29 9371)  Complications: No apparent anesthesia complications

## 2017-06-30 NOTE — Anesthesia Procedure Notes (Signed)
Procedure Name: Intubation Date/Time: 06/30/2017 11:26 AM Performed by: Lavina Hamman, CRNA Pre-anesthesia Checklist: Patient identified, Emergency Drugs available, Suction available, Patient being monitored and Timeout performed Patient Re-evaluated:Patient Re-evaluated prior to induction Oxygen Delivery Method: Circle system utilized Preoxygenation: Pre-oxygenation with 100% oxygen Induction Type: IV induction, Cricoid Pressure applied and Rapid sequence Ventilation: Mask ventilation without difficulty Laryngoscope Size: Mac and 4 Grade View: Grade I Tube type: Oral Tube size: 7.0 mm Number of attempts: 1 Airway Equipment and Method: Stylet Placement Confirmation: ETT inserted through vocal cords under direct vision,  positive ETCO2,  CO2 detector and breath sounds checked- equal and bilateral Secured at: 21 cm Tube secured with: Tape Dental Injury: Teeth and Oropharynx as per pre-operative assessment

## 2017-06-30 NOTE — Op Note (Signed)
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  She presents with several days of persistent and frequently recurrent severe epigastric and right upper quadrant abdominal pain associated with nausea.  Workup is significant for ultrasound showing gallstones.  She has localized tenderness and ongoing pain.  After discussion regarding indications and nature of surgery and risks detailed elsewhere we have elected to proceed with laparoscopic and possible open cholecystectomy with cholangiogram.    Procedure Detail: Patient was brought to the operating room, placed in supine position on the operating table, and general endotracheal anesthesia induced.  The abdomen was widely sterilely prepped and draped.  She received preoperative IV antibiotics.  PAS were in place.  Patient timeout was performed and correct procedure verified.  She had a right lower quadrant hockey stick incision and palpable kidney in the right pelvis and a long midline incision skirting the left side of the umbilicus.  I initially attempted access with an infraumbilical incision slightly off to the right side and incised the anterior fascia for 1 cm.  Under direct vision I dissected down through the posterior fascia toward the peritoneum but this was thick and I was concerned about bowel adhesions in this area.  I elected to access with a 5 mm Optiview trocar in the left upper quadrant.  This area was anesthetized and a 5 mm incision made.  Under direct visualization the peritoneal cavity was accessed in the left upper  quadrant.  However there were significant fatty adhesions and really no space or pneumoperitoneum established.  This appeared to be omentum and with careful blunt dissection with the camera I could identify the anterior peritoneum.  The adhesions fortunately were filmy and I continued to dissect with the camera clearing a portion of the left upper quadrant working over toward the midline in the epigastrium.  I then was able to get into a fairly free space in the right mid abdomen although it was beneath the omentum.  I dissected a plane anterior to the omentum and continued to take this down off the anterior abdominal wall.  I then was able to enter a relatively clear area localized in the right upper quadrant.  The gallbladder was visualized and appeared a little edematous but not severely inflamed.  The liver was densely adherent up to the anterior abdominal wall.  I could then look back at my previous dissection and there were some loops of bowel adherent to the anterior abdominal wall in the left upper quadrant including the transverse colon but this had been taken down with the omentum and the rest of the dissection was away from the bowel.  The transplanted kidney could be visualized in the right lower quadrant.  I could visualize the area of the umbilical incision and a saw that this dissection had gone right down to the peritoneum just superior but away from loops of small bowel and under direct vision this was opened further into the peritoneal cavity and through a mattress suture of 0 Vicryl the Hassan trocar was placed at the umbilicus.  There was no need to elevate the gallbladder is the liver was so adherent to the anterior abdominal wall.  I placed one further  5 mm trocar in the right upper quadrant.  At this point I had good visualization of the porta hepatis and the gallbladder.  The distal gallbladder was grasped and retracted inferior laterally and fibrofatty tissue was stripped and off the  gallbladder toward the porta hepatis.  Peritoneum anterior and posterior to the gallbladder was incised.  The distal gallbladder was thoroughly dissected and a good critical view was obtained with the cystic lymph node adjacent to the cystic artery coursing up onto the gallbladder wall and I dissected 360 degrees around the distal gallbladder and clearly identified the cystic duct.  When the anatomy was clear the cystic artery was doubly clipped proximally clipped distally and divided as it was obscuring the cystic duct.  The cystic duct was further dissected and cleared and then clipped the gallbladder junction.  An operative cholangiogram was obtained through the cystic duct showing good visualization of a normal common bile duct and intrahepatic ducts with 3 flow into the duodenum and no filling defects.  Following this the Cholangiocath was removed and the cystic duct doubly clipped proximally and divided.  I then dissected the gallbladder away from the liver with hook cautery without difficulty.  It was removed intact and placed in a eco-catch bag and brought up to the umbilicus.  The gallbladder was decompressed and removed.  We then thoroughly irrigated the right upper quadrant.  A Surgicel pack was placed over the raw liver surface although there was no active bleeding.  We then carefully examined all the visible viscera and the anterior abdominal wall through all trocar sites to ensure that there had not been any injury and none was seen.  All CO2 was evacuated and trochars removed and the mattress suture secured at the umbilical incision.  Skin incisions were closed with subarticular Monocryl and Dermabond.  Sponge needle and instrument counts were correct.    Findings: As above  Estimated Blood Loss:  Minimal         Drains: None  Blood Given: none          Specimens: Gallbladder and contents        Complications:  * No complications entered in OR log *         Disposition: PACU -  hemodynamically stable.         Condition: stable

## 2017-06-30 NOTE — Anesthesia Preprocedure Evaluation (Addendum)
Anesthesia Evaluation  Patient identified by MRN, date of birth, ID band Patient awake    Reviewed: Allergy & Precautions, NPO status , Patient's Chart, lab work & pertinent test results  History of Anesthesia Complications Negative for: history of anesthetic complications  Airway Mallampati: II  TM Distance: >3 FB Neck ROM: Full    Dental  (+) Dental Advisory Given   Pulmonary neg pulmonary ROS,    breath sounds clear to auscultation       Cardiovascular hypertension, Pt. on medications (-) angina Rhythm:Regular Rate:Normal  '16 ECHO: EF 55-60%, valves OK   Neuro/Psych Anxiety negative neurological ROS     GI/Hepatic GERD  Medicated,Nausea and vomiting with acute chole   Endo/Other  diabetes (glu 83), Insulin Dependent  Renal/GU Renal InsufficiencyRenal disease (creat 1.87)S/p renal transplant: steroids     Musculoskeletal   Abdominal   Peds  Hematology  (+) Blood dyscrasia (Hb 10.0), anemia ,   Anesthesia Other Findings   Reproductive/Obstetrics XXX: ovarian failure                            Anesthesia Physical Anesthesia Plan  ASA: III  Anesthesia Plan: General   Post-op Pain Management:    Induction:   PONV Risk Score and Plan: 4 or greater and Scopolamine patch - Pre-op, Dexamethasone and Ondansetron  Airway Management Planned: Oral ETT  Additional Equipment:   Intra-op Plan:   Post-operative Plan: Extubation in OR  Informed Consent: I have reviewed the patients History and Physical, chart, labs and discussed the procedure including the risks, benefits and alternatives for the proposed anesthesia with the patient or authorized representative who has indicated his/her understanding and acceptance.   Dental advisory given  Plan Discussed with: CRNA and Surgeon  Anesthesia Plan Comments: (Plan routine monitors, GETA)        Anesthesia Quick Evaluation

## 2017-06-30 NOTE — Progress Notes (Signed)
Dr. Excell Seltzer aware via phone Suanne Marker from Diabetes Management  team recommended the frequency of CBG checks be changed to q4h. See clarification order received.
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
## 2017-06-30 NOTE — Anesthesia Postprocedure Evaluation (Signed)
Anesthesia Post Note  Patient: [name]  Procedure(s) Performed: LAPAROSCOPIC CHOLECYSTECTOMY WITH INTRAOPERATIVE CHOLANGIOGRAM (N/A )     Patient location during evaluation: PACU Anesthesia Type: General Level of consciousness: sedated, oriented and patient cooperative Pain management: pain level controlled Vital Signs Assessment: post-procedure vital signs reviewed and stable Respiratory status: spontaneous breathing, nonlabored ventilation and respiratory function stable Cardiovascular status: blood pressure returned to baseline and stable Postop Assessment: no apparent nausea or vomiting Anesthetic complications: no    Last Vitals:  Vitals:   06/30/17 1415 06/30/17 1428  BP: 128/90 128/89  Pulse: 83   Resp: 13   Temp:    SpO2: 98%     Last Pain:  Vitals:   06/30/17 1428  TempSrc:   PainSc: Asleep                 Kemyah Buser,E. Zyere Jiminez

## 2017-06-30 NOTE — Discharge Instructions (Signed)
[facility] ______[facility] °LAPAROSCOPIC SURGERY: POST OP INSTRUCTIONS °Always review your discharge instruction sheet given to you by the facility where your surgery was performed. °IF YOU HAVE DISABILITY OR FAMILY LEAVE FORMS, YOU MUST BRING THEM TO THE OFFICE FOR PROCESSING.   °DO NOT GIVE THEM TO YOUR DOCTOR. ° °1. A prescription for pain medication may be given to you upon discharge.  Take your pain medication as prescribed, if needed.  If narcotic pain medicine is not needed, then you may take acetaminophen (Tylenol) or ibuprofen (Advil) as needed. °2. Take your usually prescribed medications unless otherwise directed. °3. If you need a refill on your pain medication, please contact your pharmacy.  They will contact our office to request authorization. Prescriptions will not be filled after 5pm or on week-ends. °4. You should follow a light diet the first few days after arrival home, such as soup and crackers, etc.  Be sure to include lots of fluids daily. °5. Most patients will experience some swelling and bruising in the area of the incisions.  Ice packs will help.  Swelling and bruising can take several days to resolve.  °6. It is common to experience some constipation if taking pain medication after surgery.  Increasing fluid intake and taking a stool softener (such as Colace) will usually help or prevent this problem from occurring.  A mild laxative (Milk of Magnesia or Miralax) should be taken according to package instructions if there are no bowel movements after 48 hours. °7. Unless discharge instructions indicate otherwise, you may remove your bandages 24-48 hours after surgery, and you may shower at that time.  You may have steri-strips (small skin tapes) in place directly over the incision.  These strips should be left on the skin for 7-10 days.  If your surgeon used skin glue on the incision, you may shower in 24 hours.  The glue will flake off over the next 2-3 weeks.  Any sutures or  staples will be removed at the office during your follow-up visit. °8. ACTIVITIES:  You may resume regular (light) daily activities beginning the next day--such as daily self-care, walking, climbing stairs--gradually increasing activities as tolerated.  You may have sexual intercourse when it is comfortable.  Refrain from any heavy lifting or straining until approved by your doctor. °a. You may drive when you are no longer taking prescription pain medication, you can comfortably wear a seatbelt, and you can safely maneuver your car and apply brakes. °b. RETURN TO WORK:  __________________________________________________________ °9. You should see your doctor in the office for a follow-up appointment approximately 2-3 weeks after your surgery.  Make sure that you call for this appointment within a day or two after you arrive home to insure a convenient appointment time. °10. OTHER INSTRUCTIONS: __________________________________________________________________________________________________________________________ __________________________________________________________________________________________________________________________ °WHEN TO CALL YOUR DOCTOR: °1. Fever over 101.0 °2. Inability to urinate °3. Continued bleeding from incision. °4. Increased pain, redness, or drainage from the incision. °5. Increasing abdominal pain ° °The clinic staff is available to answer your questions during regular business hours.  Please don’t hesitate to call and ask to speak to one of the nurses for clinical concerns.  If you have a medical emergency, go to the nearest emergency room or call 911.  A surgeon from [facility] is always on call at the hospital. °[address], [address], [place], [place]  [address] ? [address] [address], [place], [place]   [address] °[phone] ? [phone] ? FAX [phone] °Web site:   [email or web address] °

## 2017-06-30 NOTE — Progress Notes (Signed)
Patient ID: [name], female   DOB: 05-Feb-1986, 32 y.o.   MRN: [id number]     Subjective: Still with epigastric and right upper quadrant abdominal pain this morning.  Objective: Vital signs in last 24 hours: Temp:  [98 F (36.7 C)-98.7 F (37.1 C)] 98.7 F (37.1 C) (04/18 0341) Pulse Rate:  [83-100] 87 (04/18 0341) Resp:  [16-20] 18 (04/18 0341) BP: (103-166)/(74-141) 105/78 (04/18 0341) SpO2:  [95 %-100 %] 95 % (04/18 0341) Weight:  [79.6 kg (175 lb 7.8 oz)] 79.6 kg (175 lb 7.8 oz) (04/17 2300) Last BM Date: 06/27/17  Intake/Output from previous day: 04/17 0701 - 04/18 0700 In: 1565 [I.V.:1065; IV Piggyback:500] Out: 350 [Urine:350] Intake/Output this shift: No intake/output data recorded.  General appearance: alert, cooperative, mild distress and moderately obese GI: Moderate tenderness in epigastrium and right upper quadrant.  Mild diffuse tenderness.  Kidney palpable right lower quadrant and not particularly tender.  Healed right lower quadrant and upper midline incisions without hernias.  Lab Results:  Recent Labs    06/29/17 1655 06/30/17 0421  WBC 10.1 9.8  HGB 10.7* 10.0*  HCT 34.2* 32.0*  PLT 293 276   BMET Recent Labs    06/29/17 1655 06/30/17 0421  NA 137 140  K 3.5 3.9  CL 103 106  CO2 23 22  GLUCOSE 240* 98  BUN 44* 38*  CREATININE 2.34* 1.87*  CALCIUM 7.3* 7.0*   CBG (last 3)  Recent Labs    06/29/17 2331 06/30/17 0343 06/30/17 0734  GLUCAP 155* 91 149*     Studies/Results: US Abdomen Limited Ruq  Result Date: 06/29/2017 CLINICAL DATA:  RIGHT upper quadrant pain. History of kidney stones, renal transplant, melanoma. EXAM: ULTRASOUND ABDOMEN LIMITED RIGHT UPPER QUADRANT COMPARISON:  Abdominal ultrasound June 28, 2017. CT abdomen and pelvis December 10, 2016. FINDINGS: Gallbladder: Multiple echogenic gallstones with acoustic shadowing measuring to 7 mm. No gallbladder wall thickening or pericholecystic fluid. No sonographic Murphy's  sign elicited. Common bile duct: Diameter: 6 mm, top-normal.  No choledocholithiasis. Liver: Two echogenic masses measuring to 2 cm, unchanged from yesterday. Heterogeneous echotexture. Vein is patent on color Doppler imaging with normal direction of blood flow towards the liver. IMPRESSION: 1. Stable cholelithiasis without sonographic findings of acute cholecystitis. 2. Two small echogenic liver lesions could reflect hemangiomas though, would be better characterized on NONEMERGENT MRI with and without contrast, when patient's renal function improves. Electronically Signed   By: Elon Alas M.D.   On: 06/29/2017 17:14   US Abdomen Limited Ruq  Result Date: 06/28/2017 CLINICAL DATA:  Abdominal pain, nausea and vomiting EXAM: ULTRASOUND ABDOMEN LIMITED RIGHT UPPER QUADRANT COMPARISON:  12/10/2016 FINDINGS: Gallbladder: Small echogenic shadowing gallstones, largest measures 7 mm. These appear mobile. No associated wall thickening or pericholecystic fluid. Wall thickness measures 2 mm. No Murphy's sign elicited. Common bile duct: Diameter: 4.5 mm Liver: Mildly heterogeneous increased echogenicity. Slight diffuse intrahepatic biliary prominence, nonspecific. There are 2 adjacent ill-defined echogenic areas or lesions in the right hepatic lobe 1 measuring 1.5 x 1.9 x 1.7 cm and a second measuring 1.5 x 1.4 x 1.8 cm. No available contrast-enhanced studies of the liver to accurately assess for a previous hepatic abnormality. Further evaluation will be recommended with abdominal MRI without and with contrast. Portal vein is patent on color Doppler imaging with normal direction of blood flow towards the liver. IMPRESSION: Cholelithiasis, measuring up to 7 mm. No signs of cholecystitis. Mildly heterogeneous liver with biliary prominence and 2 ill-defined echogenic right  hepatic lesions largest measures 1.9 cm remaining indeterminate. These warrant further evaluation with nonemergent abdominal MRI without and with  contrast. Electronically Signed   By: Jerilynn Mages.  Shick M.D.   On: 06/28/2017 14:57    Anti-infectives: Anti-infectives (From admission, onward)   Start     Dose/Rate Route Frequency Ordered Stop   06/30/17 0830  ceFAZolin (ANCEF) IVPB 2g/100 mL premix     2 g 200 mL/hr over 30 Minutes Intravenous To Surgery 06/30/17 0824 07/01/17 0830      Assessment/Plan: Patient with end-stage renal disease, XXX syndrome, status post renal transplant and type 1 diabetes presents with persistent and recurrent colicky epigastric and right upper quadrant pain.  Ongoing pain.  Gallbladder ultrasound shows stones without evidence of cholecystitis.  I think she very likely has symptomatic gallbladder disease and possible early cholecystitis.  Kidney function and blood sugar stable.  I recommended proceeding with urgent laparoscopic cholecystectomy.I discussed the procedure in detail with the patient and her husband.   We discussed the risks and benefits of a laparoscopic cholecystectomy and possible cholangiogram including, but not limited to bleeding, infection, injury to surrounding structures such as the intestine or liver, bile leak, retained gallstones, need to convert to an open procedure particularly with her previous abdominal surgery, prolonged diarrhea, blood clots such as  DVT, common bile duct injury, anesthesia risks, and possible need for additional procedures.  The likelihood of improvement in symptoms and return to the patient's normal status is good. We discussed the typical post-operative recovery course.  All questions were answered and she desires to proceed We have consulted diabetes coordinator and will alert her renal physician that she is in the hospital and will be having surgery.   LOS: 1 day    Edward Jolly 06/30/2017

## 2017-06-30 NOTE — Progress Notes (Signed)
Inpatient Diabetes Program Recommendations  AACE/ADA: New Consensus Statement on Inpatient Glycemic Control (2015)  Target Ranges:  Prepandial:   less than 140 mg/dL      Peak postprandial:   less than 180 mg/dL (1-2 hours)      Critically ill patients:  140 - 180 mg/dL   Lab Results  Component Value Date   GLUCAP 122 (H) 06/30/2017   HGBA1C 11.5 (H) 12/12/2016    Review of Glycemic Control  Diabetes history: DM1 Outpatient Diabetes medications: Tresiba 80 units QD, Novolog 10-15 units bid with meal Current orders for Inpatient glycemic control: Lantus 40 units QHS, Novolog 0-20 units Q4H  Last HgbA1C - 12/12/2016 - 11.5% - indicates poor glycemic control. Received Decadron 10 mg in OR  Inpatient Diabetes Program Recommendations:      Continue with Lantus 40 units QHS Change Novolog to 0-15 units Q4H  Will f/u in am.  Thank you. Lorenda Peck, RD, LDN, CDE Inpatient Diabetes Coordinator [phone]

## 2017-07-01 ENCOUNTER — Encounter (HOSPITAL_COMMUNITY): Payer: Self-pay | Admitting: General Surgery

## 2017-07-01 LAB — GLUCOSE, CAPILLARY
Glucose-Capillary: 263 mg/dL — ABNORMAL HIGH (ref 65–99)
Glucose-Capillary: 195 mg/dL — ABNORMAL HIGH (ref 65–99)

## 2017-07-01 MED ORDER — OXYCODONE HCL 5 MG PO TABS: 5.0000 mg | ORAL_TABLET | Freq: Four times a day (QID) | ORAL | 0 refills | Status: DC | PRN

## 2017-07-01 NOTE — Progress Notes (Signed)
Discharge instructions reviewed with patient and patient's brother; both verbalize understanding.  Patient is stable; In no apparent distress. IV d/c'd' patient awaiting her ride

## 2017-07-02 ENCOUNTER — Emergency Department (HOSPITAL_COMMUNITY): Payer: Medicare Other

## 2017-07-02 ENCOUNTER — Inpatient Hospital Stay (HOSPITAL_COMMUNITY)
Admission: EM | Admit: 2017-07-02 | Discharge: 2017-07-05 | DRG: 378 | Disposition: A | Discharge status: Home / Self Care | Payer: Medicare Other | Attending: Family Medicine | Admitting: Family Medicine

## 2017-07-02 ENCOUNTER — Other Ambulatory Visit: Payer: Self-pay

## 2017-07-02 ENCOUNTER — Encounter (HOSPITAL_COMMUNITY): Payer: Self-pay | Admitting: *Deleted

## 2017-07-02 DIAGNOSIS — Z7952 Long term (current) use of systemic steroids: Secondary | ICD-10-CM

## 2017-07-02 DIAGNOSIS — D631 Anemia in chronic kidney disease: Secondary | ICD-10-CM | POA: Diagnosis present

## 2017-07-02 DIAGNOSIS — I129 Hypertensive chronic kidney disease with stage 1 through stage 4 chronic kidney disease, or unspecified chronic kidney disease: Secondary | ICD-10-CM | POA: Diagnosis present

## 2017-07-02 DIAGNOSIS — Z886 Allergy status to analgesic agent status: Secondary | ICD-10-CM | POA: Diagnosis not present

## 2017-07-02 DIAGNOSIS — K449 Diaphragmatic hernia without obstruction or gangrene: Secondary | ICD-10-CM | POA: Diagnosis not present

## 2017-07-02 DIAGNOSIS — I10 Essential (primary) hypertension: Secondary | ICD-10-CM | POA: Diagnosis present

## 2017-07-02 DIAGNOSIS — Z91018 Allergy to other foods: Secondary | ICD-10-CM

## 2017-07-02 DIAGNOSIS — E1065 Type 1 diabetes mellitus with hyperglycemia: Secondary | ICD-10-CM | POA: Diagnosis not present

## 2017-07-02 DIAGNOSIS — E109 Type 1 diabetes mellitus without complications: Secondary | ICD-10-CM | POA: Diagnosis present

## 2017-07-02 DIAGNOSIS — G8918 Other acute postprocedural pain: Secondary | ICD-10-CM | POA: Diagnosis present

## 2017-07-02 DIAGNOSIS — R05 Cough: Secondary | ICD-10-CM | POA: Diagnosis not present

## 2017-07-02 DIAGNOSIS — N189 Chronic kidney disease, unspecified: Secondary | ICD-10-CM | POA: Diagnosis present

## 2017-07-02 DIAGNOSIS — Z940 Kidney transplant status: Secondary | ICD-10-CM | POA: Diagnosis not present

## 2017-07-02 DIAGNOSIS — T380X5A Adverse effect of glucocorticoids and synthetic analogues, initial encounter: Secondary | ICD-10-CM | POA: Diagnosis present

## 2017-07-02 DIAGNOSIS — K920 Hematemesis: Secondary | ICD-10-CM | POA: Diagnosis not present

## 2017-07-02 DIAGNOSIS — E878 Other disorders of electrolyte and fluid balance, not elsewhere classified: Secondary | ICD-10-CM | POA: Diagnosis present

## 2017-07-02 DIAGNOSIS — N182 Chronic kidney disease, stage 2 (mild): Secondary | ICD-10-CM | POA: Diagnosis not present

## 2017-07-02 DIAGNOSIS — E0922 Drug or chemical induced diabetes mellitus with diabetic chronic kidney disease: Secondary | ICD-10-CM | POA: Diagnosis present

## 2017-07-02 DIAGNOSIS — E2740 Unspecified adrenocortical insufficiency: Secondary | ICD-10-CM | POA: Diagnosis not present

## 2017-07-02 DIAGNOSIS — K210 Gastro-esophageal reflux disease with esophagitis: Secondary | ICD-10-CM | POA: Diagnosis present

## 2017-07-02 DIAGNOSIS — Z79899 Other long term (current) drug therapy: Secondary | ICD-10-CM

## 2017-07-02 DIAGNOSIS — R112 Nausea with vomiting, unspecified: Secondary | ICD-10-CM | POA: Diagnosis not present

## 2017-07-02 DIAGNOSIS — Z9049 Acquired absence of other specified parts of digestive tract: Secondary | ICD-10-CM | POA: Diagnosis not present

## 2017-07-02 DIAGNOSIS — E0965 Drug or chemical induced diabetes mellitus with hyperglycemia: Secondary | ICD-10-CM | POA: Diagnosis present

## 2017-07-02 DIAGNOSIS — D899 Disorder involving the immune mechanism, unspecified: Secondary | ICD-10-CM

## 2017-07-02 DIAGNOSIS — R000 Tachycardia, unspecified: Secondary | ICD-10-CM | POA: Diagnosis present

## 2017-07-02 DIAGNOSIS — E785 Hyperlipidemia, unspecified: Secondary | ICD-10-CM | POA: Diagnosis present

## 2017-07-02 DIAGNOSIS — E2839 Other primary ovarian failure: Secondary | ICD-10-CM | POA: Diagnosis present

## 2017-07-02 DIAGNOSIS — E860 Dehydration: Secondary | ICD-10-CM | POA: Diagnosis not present

## 2017-07-02 DIAGNOSIS — Z888 Allergy status to other drugs, medicaments and biological substances status: Secondary | ICD-10-CM

## 2017-07-02 DIAGNOSIS — Z9889 Other specified postprocedural states: Secondary | ICD-10-CM

## 2017-07-02 DIAGNOSIS — D500 Iron deficiency anemia secondary to blood loss (chronic): Secondary | ICD-10-CM | POA: Diagnosis not present

## 2017-07-02 DIAGNOSIS — E876 Hypokalemia: Secondary | ICD-10-CM | POA: Diagnosis present

## 2017-07-02 DIAGNOSIS — Z91013 Allergy to seafood: Secondary | ICD-10-CM | POA: Diagnosis not present

## 2017-07-02 DIAGNOSIS — E108 Type 1 diabetes mellitus with unspecified complications: Secondary | ICD-10-CM | POA: Diagnosis not present

## 2017-07-02 DIAGNOSIS — IMO0002 Reserved for concepts with insufficient information to code with codable children: Secondary | ICD-10-CM | POA: Diagnosis present

## 2017-07-02 DIAGNOSIS — N183 Chronic kidney disease, stage 3 (moderate): Secondary | ICD-10-CM | POA: Diagnosis not present

## 2017-07-02 DIAGNOSIS — D849 Immunodeficiency, unspecified: Secondary | ICD-10-CM | POA: Diagnosis present

## 2017-07-02 LAB — PHOSPHORUS: PHOSPHORUS: 4.4 mg/dL (ref 2.5–4.6)

## 2017-07-02 LAB — COMPREHENSIVE METABOLIC PANEL
ALBUMIN: 3.9 g/dL (ref 3.5–5.0)
ALT: 31 U/L (ref 14–54)
AST: 23 U/L (ref 15–41)
Alkaline Phosphatase: 79 U/L (ref 38–126)
Anion gap: 16 — ABNORMAL HIGH (ref 5–15)
BUN: 28 mg/dL — AB (ref 6–20)
CHLORIDE: 94 mmol/L — AB (ref 101–111)
CO2: 30 mmol/L (ref 22–32)
Calcium: 7.5 mg/dL — ABNORMAL LOW (ref 8.9–10.3)
Creatinine, Ser: 1.67 mg/dL — ABNORMAL HIGH (ref 0.44–1.00)
GFR calc Af Amer: 46 mL/min — ABNORMAL LOW (ref >60)
GFR, EST NON AFRICAN AMERICAN: 39 mL/min — AB (ref >60)
Glucose, Bld: 227 mg/dL — ABNORMAL HIGH (ref 65–99)
POTASSIUM: 3.2 mmol/L — AB (ref 3.5–5.1)
SODIUM: 140 mmol/L (ref 135–145)
Total Bilirubin: 1.1 mg/dL (ref 0.3–1.2)
Total Protein: 8.1 g/dL (ref 6.5–8.1)

## 2017-07-02 LAB — CBC
HEMATOCRIT: 39.3 % (ref 36.0–46.0)
HEMOGLOBIN: 12.4 g/dL (ref 12.0–15.0)
MCH: 28 pg (ref 26.0–34.0)
MCHC: 31.6 g/dL (ref 30.0–36.0)
MCV: 88.7 fL (ref 78.0–100.0)
Platelets: 285 10*3/uL (ref 150–400)
RBC: 4.43 MIL/uL (ref 3.87–5.11)
RDW: 15.1 % (ref 11.5–15.5)
WBC: 14.4 10*3/uL — ABNORMAL HIGH (ref 4.0–10.5)

## 2017-07-02 LAB — MAGNESIUM: Magnesium: 1.3 mg/dL — ABNORMAL LOW (ref 1.7–2.4)

## 2017-07-02 LAB — LIPASE, BLOOD: LIPASE: 31 U/L (ref 11–51)

## 2017-07-02 LAB — CBG MONITORING, ED: GLUCOSE-CAPILLARY: 206 mg/dL — AB (ref 65–99)

## 2017-07-02 MED ORDER — HYDROCORTISONE NA SUCCINATE PF 100 MG IJ SOLR
25.0000 mg | Freq: Three times a day (TID) | INTRAMUSCULAR | Status: DC
  Administered 2017-07-03 (×2): 25 mg via INTRAVENOUS
  Filled 2017-07-02 (×2): qty 2

## 2017-07-02 MED ORDER — PREDNISONE 20 MG PO TABS
60.0000 mg | ORAL_TABLET | Freq: Once | ORAL | Status: DC
  Filled 2017-07-02: qty 3

## 2017-07-02 MED ORDER — SODIUM CHLORIDE 0.9 % IV BOLUS
1000.0000 mL | Freq: Once | INTRAVENOUS | Status: AC
  Administered 2017-07-02: 1000 mL via INTRAVENOUS

## 2017-07-02 MED ORDER — MAGNESIUM SULFATE 50 % IJ SOLN: 2.0000 g | Freq: Once | INTRAMUSCULAR | Status: DC

## 2017-07-02 MED ORDER — MAGNESIUM SULFATE 2 GM/50ML IV SOLN
2.0000 g | Freq: Once | INTRAVENOUS | Status: AC
  Administered 2017-07-03: 2 g via INTRAVENOUS
  Filled 2017-07-02: qty 50

## 2017-07-02 MED ORDER — POTASSIUM CHLORIDE 10 MEQ/100ML IV SOLN
10.0000 meq | INTRAVENOUS | Status: AC
  Administered 2017-07-02 – 2017-07-03 (×3): 10 meq via INTRAVENOUS
  Filled 2017-07-02 (×3): qty 100

## 2017-07-02 MED ORDER — HYDROMORPHONE HCL 1 MG/ML IJ SOLN
1.0000 mg | Freq: Once | INTRAMUSCULAR | Status: AC
  Administered 2017-07-02: 1 mg via INTRAVENOUS
  Filled 2017-07-02: qty 1

## 2017-07-02 MED ORDER — PROMETHAZINE HCL 25 MG/ML IJ SOLN
25.0000 mg | Freq: Once | INTRAMUSCULAR | Status: AC
  Administered 2017-07-02: 25 mg via INTRAVENOUS
  Filled 2017-07-02: qty 1

## 2017-07-02 MED ORDER — FAMOTIDINE IN NACL 20-0.9 MG/50ML-% IV SOLN
20.0000 mg | Freq: Once | INTRAVENOUS | Status: AC
  Administered 2017-07-02: 20 mg via INTRAVENOUS
  Filled 2017-07-02: qty 50

## 2017-07-02 MED ORDER — ONDANSETRON HCL 4 MG/2ML IJ SOLN
4.0000 mg | Freq: Once | INTRAMUSCULAR | Status: AC
  Administered 2017-07-02: 4 mg via INTRAVENOUS
  Filled 2017-07-02: qty 2

## 2017-07-02 MED ORDER — TACROLIMUS 1 MG PO CAPS
1.0000 mg | ORAL_CAPSULE | Freq: Two times a day (BID) | ORAL | Status: DC
  Filled 2017-07-02: qty 1

## 2017-07-02 MED ORDER — MYCOPHENOLATE SODIUM 180 MG PO TBEC
720.0000 mg | DELAYED_RELEASE_TABLET | Freq: Two times a day (BID) | ORAL | Status: DC
  Filled 2017-07-02: qty 4

## 2017-07-02 MED ORDER — TACROLIMUS 1 MG PO CAPS
2.0000 mg | ORAL_CAPSULE | Freq: Two times a day (BID) | ORAL | Status: DC
  Administered 2017-07-03 – 2017-07-05 (×6): 2 mg via ORAL
  Filled 2017-07-02 (×6): qty 2

## 2017-07-02 NOTE — ED Triage Notes (Signed)
Pt had GB surgery yesterday, can not keep food or liquids in, increased pain.

## 2017-07-02 NOTE — ED Notes (Addendum)
Pts mother in room demanding patient be admitted. She sts " going home with  A bunch of pills ain't gonna cut it. If I yell loud enough they will have to admit her". She is very rude to this Probation officer.

## 2017-07-02 NOTE — ED Notes (Signed)
Pt has been made aware of UA sample. Pt stated that she is having a hard time using the restroom.

## 2017-07-02 NOTE — ED Notes (Signed)
Spoke with Abigail Butts in Pharmacy, pharmacy to verify phenergan.

## 2017-07-02 NOTE — Consult Note (Signed)
Reason for Consult : nausea/vomiting Referring Physician: Autym Siess is an 32 y.o. female.  HPI:  Pt is a 32 yo F POD 2 lap chole with normal cholangiogram for biliary colic/early acute cholecystitis.  She is s/p kidney transplant for ESRD and has type 1 DM.  Kidney is functioning.  She was passing gas yesterday and tolerating some food without nausea or vomiting.  Pain was controlled.  However, she did not eat much last night and threw up multiple times today.  She had zofran which she took that did not relieve her symptoms, and her brother called in as well.  We called in phenergan suppositories which she tried and they did not help.  She also threw up some blood.  She was quite concerned appropriately about her diabetes and her kidney and came to the ED.    Upon arrival to the ED, she threw up more.  She had LFTs which were normal.  Lipase was also normal.  She was hypokalemic and hypochloremic. Her HCT was higher than on discharge.  Her WBCs were elevated, but she got stress dose steroids around the time of operation.    A CT was done which showed a small fluid collection.  We are asked to evaluate.  Past Medical History:  Diagnosis Date  . Chronic kidney disease   . Diabetes mellitus    Pt reports diagnosis in June 2011  . Hyperlipidemia   . Hypertension   . Kidney transplant recipient   . 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 Pyelonephritis 06/23/2014  . UTI (urinary tract infection) 01/09/2015  . 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 CHOLECYSTECTOMY N/A 06/30/2017   Procedure: LAPAROSCOPIC CHOLECYSTECTOMY WITH INTRAOPERATIVE CHOLANGIOGRAM;  Surgeon: Excell Seltzer, MD;  Location: [facility];  Service: General;  Laterality: N/A;  . KIDNEY TRANSPLANT  2007  . RENAL BIOPSY Bilateral 2003  . THYROID LOBECTOMY       Family History  Problem Relation Age of Onset  . Arthritis Mother   . Diabetes Mother   . Hypertension Mother     Social History:  reports that she has never smoked. She has never used smokeless tobacco. She reports that she does not drink alcohol or use drugs.  Allergies:  Allergies  Allergen Reactions  . Benadryl [Diphenhydramine-Zinc Acetate] Shortness Of Breath  . Motrin [Ibuprofen] Shortness Of Breath and Itching    Per pt  . Banana Other (See Comments)    Sick on the stomach  . Diphenhydramine Hcl     REACTION: Stopped breathing in ICU  . Doxycycline     Shortness of Breath   . Iron Dextran     REACTION: vein irritation  . Milk-Related Compounds     intolerant  . Shellfish Allergy Hives    Medications:  ACCU-CHEK SOFTCLIX LANCETS lancets    acetaminophen (TYLENOL) 500 MG tablet    calcium carbonate (TUMS) 500 MG chewable tablet    cephALEXin (KEFLEX) 500 MG capsule    cetirizine (ZYRTEC) 10 MG tablet    cyclobenzaprine (FLEXERIL) 10 MG tablet    diazepam (VALIUM) 5 MG tablet    epoetin alfa (EPOGEN,PROCRIT) 72902 UNIT/ML injection    famotidine (PEPCID) 40 MG tablet    fluticasone (FLONASE) 50 MCG/ACT nasal spray    furosemide (  LASIX) 20 MG tablet    gabapentin (NEURONTIN) 100 MG capsule    hydrOXYzine (ATARAX/VISTARIL) 50 MG tablet    insulin aspart (NOVOLOG FLEXPEN) 100 UNIT/ML FlexPen    insulin degludec (TRESIBA FLEXTOUCH) 100 UNIT/ML SOPN FlexTouch Pen    loperamide (IMODIUM) 2 MG capsule    mycophenolate (MYFORTIC) 360 MG TBEC    ondansetron (ZOFRAN) 4 MG tablet    oxyCODONE (OXY IR/ROXICODONE) 5 MG immediate release tablet    predniSONE (DELTASONE) 5 MG tablet    ranitidine (ZANTAC) 150 MG tablet    simvastatin (ZOCOR) 20 MG tablet    tacrolimus (PROGRAF) 1 MG capsule         Results for orders placed or performed during the hospital encounter of 07/02/17 (from the past 48 hour(s))  Comprehensive metabolic panel     Status: Abnormal    Collection Time: 07/02/17  5:22 PM  Result Value Ref Range   Sodium 140 135 - 145 mmol/L   Potassium 3.2 (L) 3.5 - 5.1 mmol/L   Chloride 94 (L) 101 - 111 mmol/L   CO2 30 22 - 32 mmol/L   Glucose, Bld 227 (H) 65 - 99 mg/dL   BUN 28 (H) 6 - 20 mg/dL   Creatinine, Ser 1.67 (H) 0.44 - 1.00 mg/dL   Calcium 7.5 (L) 8.9 - 10.3 mg/dL   Total Protein 8.1 6.5 - 8.1 g/dL   Albumin 3.9 3.5 - 5.0 g/dL   AST 23 15 - 41 U/L   ALT 31 14 - 54 U/L   Alkaline Phosphatase 79 38 - 126 U/L   Total Bilirubin 1.1 0.3 - 1.2 mg/dL   GFR calc non Af Amer 39 (L) >60 mL/min   GFR calc Af Amer 46 (L) >60 mL/min    Comment: (NOTE) The eGFR has been calculated using the CKD EPI equation. This calculation has not been validated in all clinical situations. eGFR's persistently <60 mL/min signify possible Chronic Kidney Disease.    Anion gap 16 (H) 5 - 15    Comment: Performed at [facility], [place] [address] [place], [place] [address]  Lipase, blood     Status: None   Collection Time: 07/02/17  5:22 PM  Result Value Ref Range   Lipase 31 11 - 51 U/L    Comment: Performed at [facility], [place] [address] [place], [place] [address]  CBC     Status: Abnormal   Collection Time: 07/02/17  5:22 PM  Result Value Ref Range   WBC 14.4 (H) 4.0 - 10.5 K/uL   RBC 4.43 3.87 - 5.11 MIL/uL   Hemoglobin 12.4 12.0 - 15.0 g/dL   HCT 39.3 36.0 - 46.0 %   MCV 88.7 78.0 - 100.0 fL   MCH 28.0 26.0 - 34.0 pg   MCHC 31.6 30.0 - 36.0 g/dL   RDW 15.1 11.5 - 15.5 %   Platelets 285 150 - 400 K/uL    Comment: Performed at [facility], [place] [address] [place], [place] [address]  Magnesium     Status: Abnormal   Collection Time: 07/02/17  5:22 PM  Result Value Ref Range   Magnesium 1.3 (L) 1.7 - 2.4 mg/dL    Comment: Performed at [facility], [place] [address], [place], [place] [address]  Phosphorus     Status: None   Collection Time: 07/02/17  5:22 PM   Result Value Ref Range   Phosphorus 4.4 2.5 - 4.6 mg/dL  Comment: Performed at [facility], [place] [address] [place], [place] [address]  POC CBG, ED     Status: Abnormal   Collection Time: 07/02/17  5:27 PM  Result Value Ref Range   Glucose-Capillary 206 (H) 65 - 99 mg/dL    Ct Abdomen Pelvis Wo Contrast  Result Date: 07/02/2017 CLINICAL DATA:  Cholecystectomy 06/30/2017. Possible postoperative complication suspected. Renal transplant 2007. EXAM: CT ABDOMEN AND PELVIS WITHOUT CONTRAST TECHNIQUE: Multidetector CT imaging of the abdomen and pelvis was performed following the standard protocol without IV contrast. COMPARISON:  CT of the abdomen and pelvis 12/10/2016 FINDINGS: Lower chest: There is subsegmental atelectasis at both lung bases. Trace pericardial effusion. Hepatobiliary: The liver is homogeneous without focal abnormality. Surgical clips are seen in the gallbladder fossa region. There is a small fluid collection in the gallbladder fossa, containing air and gas. This is crescentic, measuring 5.7 x 1.4 by 3.2 centimeters. Pancreas: Unremarkable. No pancreatic ductal dilatation or surrounding inflammatory changes. Spleen: Normal in size without focal abnormality. Adrenals/Urinary Tract: Adrenal glands are normal. LEFT kidney is atrophic. RIGHT native kidney is not identified. RIGHT iliac fossa renal transplant appears stable compared to prior study. Transplant kidney appears enlarged and contains focal low-attenuation areas within the MEDIAL aspect and subcapsular midpole region, all stable findings. There is stable mild prominence of the renal pelvis and stranding in the region of the hilum. Normal appearance of the urinary bladder. Stomach/Bowel: Small hiatal hernia. Stomach is otherwise normal. Small bowel loops are normal in appearance. The appendix is well seen and has a normal appearance. Loops of colon are normal in appearance. Vascular/Lymphatic: No significant  vascular findings are present. No enlarged abdominal or pelvic lymph nodes. Reproductive: Hysterectomy.  No adnexal mass. Other: No abdominal wall hernia or abnormality. No abdominopelvic ascites. Postoperative changes are identified in the anterior abdominal wall. Subcutaneous and small amount of intraperitoneal gas remain following recent surgery. Musculoskeletal: No acute or significant osseous findings. IMPRESSION: 1. Crescentic fluid and gas attenuation in the region of the gallbladder fossa, measuring 5.7 x 1.4 x 3.2 centimeters. 2. Stable appearance of RIGHT iliac fossa renal transplant. 3. Small hiatal hernia. 4. Normal appendix. 5. Hysterectomy. 6. Postoperative changes in the anterior abdominal wall. Electronically Signed   By: Nolon Nations M.D.   On: 07/02/2017 19:12   Dg Chest 2 View  Result Date: 07/02/2017 CLINICAL DATA:  Postop cholecystectomy.  Cough. EXAM: CHEST - 2 VIEW COMPARISON:  01/13/2017 FINDINGS: Mild cardiomegaly. Low lung volumes. No confluent opacities or effusions. No acute bony abnormality. IMPRESSION: Mild cardiomegaly.  No active disease. Electronically Signed   By: Rolm Baptise M.D.   On: 07/02/2017 18:13    Review of Systems  Constitutional: Negative.   HENT: Negative.   Eyes: Negative.   Respiratory: Negative.   Cardiovascular: Negative.   Gastrointestinal: Positive for abdominal pain, nausea and vomiting.       Bloody vomit.  Genitourinary: Negative.   Musculoskeletal: Negative.   Skin: Negative.   Neurological: Negative.   Endo/Heme/Allergies: Negative.   Psychiatric/Behavioral: Negative.   All other systems reviewed and are negative.  Blood pressure 118/90, pulse (!) 103, temperature 99 F (37.2 C), temperature source Rectal, resp. rate 18, height '5\' 6"'  (1.676 m), weight 79.4 kg (175 lb), SpO2 90 %. Physical Exam  Constitutional: She is oriented to person, place, and time. She appears well-developed and well-nourished. She appears distressed.   HENT:  Head: Normocephalic and atraumatic.  Mucous membranes dry  Eyes: Pupils are  equal, round, and reactive to light. Conjunctivae are normal. No scleral icterus.  Neck: Neck supple. No JVD present. No thyromegaly present.  Cardiovascular: Normal rate and intact distal pulses.  Respiratory: Effort normal. No respiratory distress.  GI: Soft. She exhibits no distension. There is no tenderness. There is no rebound and no guarding.  Musculoskeletal: Normal range of motion.  Neurological: She is oriented to person, place, and time. Coordination normal.  Sleepy, but oriented and appropriate  Skin: Skin is warm. No erythema. There is pallor.  Psychiatric: She has a normal mood and affect. Her behavior is normal. Judgment and thought content normal.    Assessment/Plan: S/p lap chole with normal cholangiogram Nausea/vomiting Fluid collection Dehydration Hematemesis  The fluid collection does not appear to be an abscess.  This in a normal amount of fluid for POD 2.  I think her WBCs are falsely elevated secondary to her stress dose of steroids. She could have an ileus secondary to lysis of adhesions. She also could have N/V related to her diabetes or relative adrenal insufficiency. It is early to be able to definitively rule out a bile leak, but the LFTs generally rise relatively early and is usually associated with much more severe pain.  Will follow abdominal exam, symptoms, and labs.  May need HIDA if not improved in several days.  Would appreciate medical admission given complexity of DM and renal transplant along with all of her antirejection meds.    I do not think she has clinically significant GI bleed as she is hemodynamically stable.  She likely has irritation secondary to vomiting and/or OGT placement in OR.  She is at higher risk for ulcer given steroid immunosuppression.  [name] 07/02/2017, 9:09 PM

## 2017-07-02 NOTE — H&P (Addendum)
QUINCI GAVIDIA ZDG:[id number] DOB: [birth year] DOA: 07/02/2017     PCP: [name], MD   Outpatient Specialists:     NEphrology:  Dr.Cynthia Dunnam   Patient arrived to ER on 07/02/17 at 1611  Patient coming from:    home Lives  With family    Chief Complaint:  Chief Complaint  Patient presents with  . Emesis    HPI: [name] is a 32 y.o. female with medical history significant of type 2 diabetes steroid induced, chronic cholecystitis status post cholecystectomy 2 days ago status post renal transplant on immunosuppressive's history of chronic kidney disease anemia, HTN    Presented with nausea vomiting of dark vomitus worrisome for hematemesis 18 April patient undergone lap cholecystectomy with normal cholangiogram by Dr. Excell Seltzer was treated with stress dose steroids her CellCept and tacrolimus was continued she was feeling much better and was able to be discharged yesterday surgical service.  Today developed nausea vomiting with dark vomitus worrisome for hematemesis with unable to keep food or liquids down today have had increase abdominal pain.  Abdominal pain is constant worsening and diffuse.  She have not had a chance to take her Zofran prescription due to nausea and vomiting. Endorses occasional cough no fevers or chills no fever, but has  back pain, no chest pain or shortness of breath.  Reports she has been having trouble keeping down her rejection medications have not had her home meds for the past 4 days at least  Regarding pertinent Chronic problems: Status post kidney transplantation in 2009 patient on daily prednisone tacrolimus and Myfortic History of type 2 diabetes on Tresiba  While in ER:   Following Medications were ordered in ER: Medications  sodium chloride 0.9 % bolus 1,000 mL (1,000 mLs Intravenous New Bag/Given 07/02/17 1959)  tacrolimus (PROGRAF) capsule 1 mg (has no administration in time range)  predniSONE (DELTASONE) tablet 60 mg (has no  administration in time range)  mycophenolate (MYFORTIC) EC tablet 720 mg (has no administration in time range)  promethazine (PHENERGAN) injection 25 mg (has no administration in time range)  ondansetron (ZOFRAN) injection 4 mg (4 mg Intravenous Given 07/02/17 1715)  HYDROmorphone (DILAUDID) injection 1 mg (1 mg Intravenous Given 07/02/17 1714)  sodium chloride 0.9 % bolus 1,000 mL (0 mLs Intravenous Stopped 07/02/17 1959)  HYDROmorphone (DILAUDID) injection 1 mg (1 mg Intravenous Given 07/02/17 1813)  famotidine (PEPCID) IVPB 20 mg premix (0 mg Intravenous Stopped 07/02/17 1843)  HYDROmorphone (DILAUDID) injection 1 mg (1 mg Intravenous Given 07/02/17 1959)    Significant initial  Findings: Abnormal Labs Reviewed  COMPREHENSIVE METABOLIC PANEL - Abnormal; Notable for the following components:      Result Value   Potassium 3.2 (*)    Chloride 94 (*)    Glucose, Bld 227 (*)    BUN 28 (*)    Creatinine, Ser 1.67 (*)    Calcium 7.5 (*)    GFR calc non Af Amer 39 (*)    GFR calc Af Amer 46 (*)    Anion gap 16 (*)    All other components within normal limits  CBC - Abnormal; Notable for the following components:   WBC 14.4 (*)    All other components within normal limits  MAGNESIUM - Abnormal; Notable for the following components:   Magnesium 1.3 (*)    All other components within normal limits  CBG MONITORING, ED - Abnormal; Notable for the following components:   Glucose-Capillary 206 (*)  All other components within normal limits     Na 140 K 3.2  Cr   Stable,  Lab Results  Component Value Date   CREATININE 1.67 (H) 07/02/2017   CREATININE 1.87 (H) 06/30/2017   CREATININE 2.34 (H) 06/29/2017    Mg 1.3  WBC  14.4  HG/HCT   Stable     Component Value Date/Time   HGB 12.4 07/02/2017 1722   HCT 39.3 07/02/2017 1722    Lactic Acid, Venous    Component Value Date/Time   LATICACIDVEN 1.14 08/10/2016 1828      UA  ordered   CXR - NON acute mild  cardiomegaly  CTabd/pelvis -postoperative changes consistent with recent laparoscopic cholecystectomy  ECG:  Personally reviewed by me showing: HR : 119 Rhythm: sinus tachycardia   no evidence of ischemic changes QTC 490    ED Triage Vitals  Enc Vitals Group     BP 07/02/17 1635 (!) 119/93     Pulse Rate 07/02/17 1635 (!) 123     Resp 07/02/17 1635 18     Temp 07/02/17 1635 98.4 F (36.9 C)     Temp Source 07/02/17 1635 Oral     SpO2 07/02/17 1635 96 %     Weight 07/02/17 1637 175 lb (79.4 kg)     Height 07/02/17 1637 5\' 6"  (1.676 m)     Head Circumference --      Peak Flow --      Pain Score 07/02/17 1637 10     Pain Loc --      Pain Edu? --      Excl. in [place]? --   TMAX(24)@       Latest  Blood pressure (!) 122/97, pulse (!) 102, temperature 99 F (37.2 C), temperature source Rectal, resp. rate 16, height 5\' 6"  (1.676 m), weight 79.4 kg (175 lb), SpO2 100 %.   ER Provider Called:     Dr.Byrum with General Surgery They reviewed CT imaging felt that changes the typical status post cholecystectomy They Recommend admit to medicine Will see in AM   ER provider also discussed case with GI Dr. Watt Climes who will see in consult tomorrow  Hospitalist was called for admission for hematemesis and dehydration in setting of recent laparoscopic cholecystectomy   Review of Systems:    Pertinent positives include:  abdominal pain, nausea, vomiting,  Constitutional:  No weight loss, night sweats, Fevers, chills, fatigue, weight loss  HEENT:  No headaches, Difficulty swallowing,Tooth/dental problems,Sore throat,  No sneezing, itching, ear ache, nasal congestion, post nasal drip,  Cardio-vascular:  No chest pain, Orthopnea, PND, anasarca, dizziness, palpitations.no Bilateral lower extremity swelling  GI:  No heartburn, indigestion, diarrhea, change in bowel habits, loss of appetite, melena, blood in stool, hematemesis Resp:  no shortness of breath at rest. No dyspnea on exertion,  No excess mucus, no productive cough, No non-productive cough, No coughing up of blood.No change in color of mucus.No wheezing. Skin:  no rash or lesions. No jaundice GU:  no dysuria, change in color of urine, no urgency or frequency. No straining to urinate.  No flank pain.  Musculoskeletal:  No joint pain or no joint swelling. No decreased range of motion. No back pain.  Psych:  No change in mood or affect. No depression or anxiety. No memory loss.  Neuro: no localizing neurological complaints, no tingling, no weakness, no double vision, no gait abnormality, no slurred speech, no confusion  As per HPI otherwise 10 point review of systems  negative.   Past Medical History:   Past Medical History:  Diagnosis Date  . Chronic kidney disease   . Diabetes mellitus    Pt reports diagnosis in June 2011  . Hyperlipidemia   . Hypertension   . Kidney transplant recipient   . 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 Pyelonephritis 06/23/2014  . UTI (urinary tract infection) 01/09/2015  . 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   Qualifier: Diagnosis of  By: Carlena Sax  MD, Colletta [place]        Past Surgical History:  Procedure Laterality Date  . CHOLECYSTECTOMY N/A 06/30/2017   Procedure: LAPAROSCOPIC CHOLECYSTECTOMY WITH INTRAOPERATIVE CHOLANGIOGRAM;  Surgeon: Excell Seltzer, MD;  Location: [facility];  Service: General;  Laterality: N/A;  . KIDNEY TRANSPLANT  2007  . RENAL BIOPSY Bilateral 2003  . THYROID LOBECTOMY      Social History:  Ambulatory   independently      reports that she has never smoked. She has never used smokeless tobacco. She reports that she does not drink alcohol or use drugs.     Family History:   Family History  Problem Relation Age of Onset  . Arthritis Mother   . Diabetes Mother   . Hypertension Mother     Allergies: Allergies  Allergen Reactions  . Benadryl [Diphenhydramine-Zinc Acetate] Shortness Of  Breath  . Motrin [Ibuprofen] Shortness Of Breath and Itching    Per pt  . Banana Other (See Comments)    Sick on the stomach  . Diphenhydramine Hcl     REACTION: Stopped breathing in ICU  . Doxycycline     Shortness of Breath   . Iron Dextran     REACTION: vein irritation  . Milk-Related Compounds     intolerant  . Shellfish Allergy Hives     Prior to Admission medications   Medication Sig Start Date End Date Taking? Authorizing Provider  ACCU-CHEK SOFTCLIX LANCETS lancets Use to check blood sugar 4 times per day. 12/29/15  Yes Renato Shin, MD  acetaminophen (TYLENOL) 500 MG tablet Take 1,000 mg by mouth daily as needed for moderate pain.    Yes [provider]  calcium carbonate (TUMS) 500 MG chewable tablet Chew 2 tablets by mouth 3 (three) times daily as needed for indigestion. Takes on Monday, wednesdays, and fridays    [provider]  cephALEXin (KEFLEX) 500 MG capsule Take 500 mg by mouth 3 (three) times daily. 06/23/17   [provider]  cetirizine (ZYRTEC) 10 MG tablet Take 10 mg by mouth daily.    [provider]  cyclobenzaprine (FLEXERIL) 10 MG tablet Take 1 tablet (10 mg total) by mouth 2 (two) times daily as needed for muscle spasms. 07/30/16   Montine Circle, PA-C  diazepam (VALIUM) 5 MG tablet Take 1 tablet (5 mg total) by mouth every 6 (six) hours as needed for anxiety (spasms). 05/11/15   Elnora Morrison, MD  epoetin alfa (EPOGEN,PROCRIT) 62952 UNIT/ML injection Inject 30,000 Units into the skin every 14 (fourteen) days.    [provider]  famotidine (PEPCID) 40 MG tablet Take 1 tablet (40 mg total) by mouth daily. 01/09/17   Caccavale, Sophia, PA-C  fluticasone (FLONASE) 50 MCG/ACT nasal spray Place 2 sprays into both nostrils daily. 07/20/15   Muthersbaugh, Jarrett Soho, PA-C  furosemide (LASIX) 20 MG tablet Take 20 mg by mouth daily as needed. Feet swelling    [provider]  gabapentin (NEURONTIN) 100  MG capsule Take  200 mg by mouth daily as needed (for feet).  11/12/16   [provider]  hydrOXYzine (ATARAX/VISTARIL) 50 MG tablet Take 50 mg by mouth 2 (two) times daily as needed for itching.     [provider]  insulin aspart (NOVOLOG FLEXPEN) 100 UNIT/ML FlexPen Inject 15 Units into the skin 2 (two) times daily with a meal. 81-150=10 units 51-200=11 units 201-250=12 units 251-100=13 units 301-350=14 units >351=15 u Per sliding scale    [provider]  insulin degludec (TRESIBA FLEXTOUCH) 100 UNIT/ML SOPN FlexTouch Pen Inject 1.3 mLs (130 Units total) into the skin daily. And pen needles 1/day 07/22/16   Renato Shin, MD  loperamide (IMODIUM) 2 MG capsule Take 2 mg by mouth as needed for diarrhea or loose stools. Take 2 tablets as needed for diarrhea.    [provider]  mycophenolate (MYFORTIC) 360 MG TBEC Take 720 mg by mouth 2 (two) times daily.     [provider]  ondansetron (ZOFRAN) 4 MG tablet Take 1 tablet (4 mg total) by mouth every 8 (eight) hours as needed for nausea or vomiting. 06/28/17   Maczis, Barth Kirks, PA-C  oxyCODONE (OXY IR/ROXICODONE) 5 MG immediate release tablet Take 1 tablet (5 mg total) by mouth every 6 (six) hours as needed for severe pain. 07/01/17   [name], MD  predniSONE (DELTASONE) 5 MG tablet Take 5 mg by mouth at bedtime.     [provider]  ranitidine (ZANTAC) 150 MG tablet Take 150-300 mg by mouth daily as needed. Acid indigestion    [provider]  simvastatin (ZOCOR) 20 MG tablet Take 20 mg by mouth 3 (three) times a week. MWF.    [provider]  tacrolimus (PROGRAF) 1 MG capsule Take 2 mg by mouth 2 (two) times daily.     [provider]   Physical Exam: Blood pressure (!) 122/97, pulse (!) 102, temperature 99 F (37.2 C), temperature source Rectal, resp. rate 16, height 5\' 6"  (1.676 m), weight 79.4 kg (175 lb), SpO2 100 %. 1. General:  in No Acute distress   acutely ill  -appearing 2. Psychological: Somnolent but   oriented 3. Head/ENT:    Dry Mucous Membranes                          Head Non traumatic, neck supple                          Normal  Dentition 4. SKIN:  decreased Skin turgor,  Skin clean Dry and intact no rash 5. Heart: Regular rate and rhythm no Murmur, no Rub or gallop 6. Lungs:  no wheezes or crackles   7. Abdomen: Soft, appropriately tender, Non distended obese  bowel sounds present incisions noted from cholecystectomy 8. Lower extremities: no clubbing, cyanosis, or edema 9. Neurologically Grossly intact, moving all 4 extremities equally   10. MSK: Normal range of motion   LABS:     Recent Labs  Lab 06/27/17 2045 06/28/17 1419 06/29/17 1655 06/30/17 0421 07/02/17 1722  WBC 11.4* 11.5* 10.1 9.8 14.4*  NEUTROABS  --  9.4*  --   --   --   HGB 11.5* 10.7* 10.7* 10.0* 12.4  HCT 35.5* 33.8* 34.2* 32.0* 39.3  MCV 87.4 88.5 87.9 89.1 88.7  PLT 260 288 293 276 496   Basic Metabolic Panel: Recent Labs  Lab 06/27/17 2045  06/28/17 1419 06/29/17 1655 06/30/17 0421 07/02/17 1722  NA 139 140 137 140 140  K 4.1 4.5 3.5 3.9 3.2*  CL 106 102 103 106 94*  CO2 21* 24 23 22 30   GLUCOSE 280* 333* 240* 98 227*  BUN 31* 40* 44* 38* 28*  CREATININE 1.98* 2.19* 2.34* 1.87* 1.67*  CALCIUM 8.1* 7.9* 7.3* 7.0* 7.5*  MG  --   --   --  1.5* 1.3*  PHOS  --   --   --  4.4 4.4      Recent Labs  Lab 06/27/17 2045 06/28/17 1419 06/29/17 1655 06/30/17 0421 07/02/17 1722  AST 17 15 16 16 23   ALT 14 14 15 14 31   ALKPHOS 77 75 73 67 79  BILITOT 0.4 0.8 0.7 0.7 1.1  PROT 7.9 7.9 8.0 7.4 8.1  ALBUMIN 3.9 3.9 3.8 3.4* 3.9   Recent Labs  Lab 06/27/17 2045 06/28/17 1419 06/29/17 1655 07/02/17 1722  LIPASE 27 26 30 31    No results for input(s): AMMONIA in the last 168 hours.    HbA1C: No results for input(s): HGBA1C in the last 72 hours. CBG: Recent Labs  Lab 06/30/17 1951 06/30/17 2327 07/01/17 0413 07/01/17 0826  07/02/17 1727  GLUCAP 297* 353* 263* 195* 206*      Urine analysis:    Component Value Date/Time   COLORURINE YELLOW 06/29/2017 1704   APPEARANCEUR HAZY (A) 06/29/2017 1704   LABSPEC 1.016 06/29/2017 1704   PHURINE 5.0 06/29/2017 1704   GLUCOSEU NEGATIVE 06/29/2017 1704   HGBUR SMALL (A) 06/29/2017 1704   HGBUR negative 01/26/2010 1026   BILIRUBINUR NEGATIVE 06/29/2017 1704   BILIRUBINUR NEG 01/01/2011 1050   KETONESUR NEGATIVE 06/29/2017 1704   PROTEINUR 100 (A) 06/29/2017 1704   UROBILINOGEN 0.2 01/09/2015 1333   NITRITE NEGATIVE 06/29/2017 1704   LEUKOCYTESUR NEGATIVE 06/29/2017 1704     Cultures:    Component Value Date/Time   SDES  06/09/2017 1549    TOE RIGHT GREAT Performed at [facility], [address] [place], [place] [address]    [facility]  06/09/2017 1549    NONE Performed at [facility], [address] [place],  [address]    CULT FEW STAPHYLOCOCCUS AUREUS 06/09/2017 1549   REPTSTATUS 06/14/2017 FINAL 06/09/2017 1549     Radiological Exams on Admission: Ct Abdomen Pelvis Wo Contrast  Result Date: 07/02/2017 CLINICAL DATA:  Cholecystectomy 06/30/2017. Possible postoperative complication suspected. Renal transplant 2007. EXAM: CT ABDOMEN AND PELVIS WITHOUT CONTRAST TECHNIQUE: Multidetector CT imaging of the abdomen and pelvis was performed following the standard protocol without IV contrast. COMPARISON:  CT of the abdomen and pelvis 12/10/2016 FINDINGS: Lower chest: There is subsegmental atelectasis at both lung bases. Trace pericardial effusion. Hepatobiliary: The liver is homogeneous without focal abnormality. Surgical clips are seen in the gallbladder fossa region. There is a small fluid collection in the gallbladder fossa, containing air and gas. This is crescentic, measuring 5.7 x 1.4 by 3.2 centimeters. Pancreas: Unremarkable. No pancreatic ductal dilatation or surrounding inflammatory changes. Spleen: Normal in size without  focal abnormality. Adrenals/Urinary Tract: Adrenal glands are normal. LEFT kidney is atrophic. RIGHT native kidney is not identified. RIGHT iliac fossa renal transplant appears stable compared to prior study. Transplant kidney appears enlarged and contains focal low-attenuation areas within the MEDIAL aspect and subcapsular midpole region, all stable findings. There is stable mild prominence of the renal pelvis and stranding in the region of the hilum. Normal appearance of the urinary bladder.  Stomach/Bowel: Small hiatal hernia. Stomach is otherwise normal. Small bowel loops are normal in appearance. The appendix is well seen and has a normal appearance. Loops of colon are normal in appearance. Vascular/Lymphatic: No significant vascular findings are present. No enlarged abdominal or pelvic lymph nodes. Reproductive: Hysterectomy.  No adnexal mass. Other: No abdominal wall hernia or abnormality. No abdominopelvic ascites. Postoperative changes are identified in the anterior abdominal wall. Subcutaneous and small amount of intraperitoneal gas remain following recent surgery. Musculoskeletal: No acute or significant osseous findings. IMPRESSION: 1. Crescentic fluid and gas attenuation in the region of the gallbladder fossa, measuring 5.7 x 1.4 x 3.2 centimeters. 2. Stable appearance of RIGHT iliac fossa renal transplant. 3. Small hiatal hernia. 4. Normal appendix. 5. Hysterectomy. 6. Postoperative changes in the anterior abdominal wall. Electronically Signed   By: Nolon Nations M.D.   On: 07/02/2017 19:12   Dg Chest 2 View  Result Date: 07/02/2017 CLINICAL DATA:  Postop cholecystectomy.  Cough. EXAM: CHEST - 2 VIEW COMPARISON:  01/13/2017 FINDINGS: Mild cardiomegaly. Low lung volumes. No confluent opacities or effusions. No acute bony abnormality. IMPRESSION: Mild cardiomegaly.  No active disease. Electronically Signed   By: Rolm Baptise M.D.   On: 07/02/2017 18:13    Chart has been  reviewed    Assessment/Plan   32 y.o. female with medical history significant of type 1 diabetes, chronic cholecystitis status post cholecystectomy 2 days ago status post renal transplant on immunosuppressive's history of chronic kidney disease anemia, HTN   Admitted for nausea vomiting hematemesis in the setting of recent cholecystectomy  Present on Admission: . Hematemesis -order PPI monitor in stepdown given tachycardia, n.p.o. postmidnight appreciate GI consult suspect possibility of Mallory-Weiss tear . Anemia in chronic renal disease stable continue to monitor serial CBC . CHRONIC KIDNEY DISEASE STAGE II (MILD) creatinine currently stable avoid nephrotoxic medications . Essential hypertension, benign -medications we will continue to monitor while hospitalized . Immunosuppressed status ([place]) patient is status post renal transplant in 2007 discussed case with nephrology.  Their recommendations will hold Myfortic for tonight attempt to give Prograf will pierce the capsule and give sublingually instead of prednisone will give IV hydrocortisone 25 mg every 8 hours nephrology will see in consult tomorrow so far creatinine stable . Intractable nausea and vomiting -supportive management CT of abdomen showing no evidence of obstruction we will rehydrate treat with antiemetics as needed . Type 1 diabetes mellitus with complication, uncontrolled (HCC) -patient states she initially has type 2 diabetes, while hospitalized with poor p.o. intake switch to Lantus will decrease dose to 40 units a day with a sliding scale adjust Lantus as needed Cholecystectomy appreciate general surgery consult and follow-up . Dehydration we will rehydrate and monitor kidney status  Tachycardia likely secondary to dehydration no chest pain or shortness of breath currently improving with IV fluids. Other plan as per orders.  DVT prophylaxis:  SCD     Code Status:  FULL CODE   Family Communication:   Family   at   Bedside    Disposition Plan:      To home once workup is complete and patient is stable                        Diabetes coordinator     Consults called: General surgery, GI Dr. Watt Climes, nephrology Dr. Jonnie Finner  Admission status:   inpatient       Level of care    Change to tele bed BP  improved.        Connelly Spruell 07/02/2017, 11:07 PM    [facility]  Pager [phone]   after 2 AM please page floor coverage PA If 7AM-7PM, please contact the day team taking care of the patient  [email or web address]  Password [email or web address]

## 2017-07-02 NOTE — Discharge Summary (Signed)
Physician Discharge Summary  Patient ID: RUBYANN LINGLE MRN: [id number] DOB/AGE: 06-27-1985 32 y.o.  Admit date: 06/29/2017 Discharge date: 07/02/2017  Admission Diagnoses: Patient Active Problem List   Diagnosis Date Noted  . Chronic cholecystitis 06/29/2017  . Type 1 diabetes mellitus with complication, uncontrolled ([place]) 05/25/2015  . Intractable nausea and vomiting 01/09/2015  . Renal transplant recipient   . Immunosuppressed status ([place])   . 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 CHRONIC KIDNEY DISEASE STAGE II ([facility]) 01/04/2010  . OVARIAN FAILURE, PREMATURE 03/11/2009  . 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 SECONDARY HYPERPARATHYROIDISM 12/05/2007  . OBESITY 09/25/2007  . Anemia in chronic renal disease 09/25/2007  . LEARNING DISABILITY 09/25/2007  . Essential hypertension, benign 09/25/2007  . KIDNEY TRANSPLANTATION, HX OF 09/25/2007    Discharge Diagnoses:  Active Problems:   Chronic cholecystitis and same as above.   Discharged Condition: stable  Hospital Course:  Pt was admitted from the ED with a dx of chronic calculous cholecystitis.  She has history of Type I DM and is immunosuppressed s/p kidney transplant (functioning).  She underwent lap chole with normal cholangiogram 06/30/17 by Dr. Adonis Housekeeper.  This was a difficult operation due to her prior abdominal surgery and required 2 surgeons, which was not unexpected.  They were successfully able to do the surgery in minimally invasive fashion.  She received stress dose steroids due to her chronic steroid use along with cellcept and tacrolimus.  She was feeling much better by the next morning and was able to void independently.  Pain was controlled.  She was not nauseated and was able to tolerate a diet. She had flatus. She desired to go home.  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    Consults: None  Significant Diagnostic Studies: labs: see  epic for full details.  Cr 1/87 after hydration  Treatments: surgery: see above, + stress dose steroids  Discharge Exam: Blood pressure 123/90, pulse 84, temperature 98.5 F (36.9 C), temperature source Oral, resp. rate 18, height 5\' 6"  (1.676 m), weight 79.6 kg (175 lb 7.8 oz), SpO2 100 %. General appearance: alert, cooperative and no distress Resp: breathing comfortably GI: soft, non distended, approp tender at incisions.  incisions c/d/i.  Disposition:   Discharge Instructions    Call MD for:  difficulty breathing, headache or visual disturbances   Complete by:  As directed    Call MD for:  persistant nausea and vomiting   Complete by:  As directed    Call MD for:  redness, tenderness, or signs of infection (pain, swelling, redness, odor or green/yellow discharge around incision site)   Complete by:  As directed    Call MD for:  severe uncontrolled pain   Complete by:  As directed    Call MD for:  temperature >100.4   Complete by:  As directed    Diet - low sodium heart healthy   Complete by:  As directed    Increase activity slowly   Complete by:  As directed      Allergies as of 07/01/2017      Reactions   Benadryl [diphenhydramine-zinc Acetate] Shortness Of Breath   Motrin [ibuprofen] Shortness Of Breath, Itching   Per pt   Banana Other (See Comments)   Sick on the stomach   Diphenhydramine Hcl    REACTION: Stopped breathing in ICU   Doxycycline    Shortness of Breath  Iron Dextran    REACTION: vein irritation   Milk-related Compounds    intolerant   Shellfish Allergy Hives      Medication List    STOP taking these medications   HYDROcodone-acetaminophen 5-325 MG tablet Commonly known as:  NORCO     TAKE these medications   ACCU-CHEK SOFTCLIX LANCETS lancets Use to check blood sugar 4 times per day.   acetaminophen 500 MG tablet Commonly known as:  TYLENOL Take 500 mg by mouth daily as needed for moderate pain.   calcium carbonate 500 MG chewable  tablet Commonly known as:  TUMS - dosed in mg elemental calcium Chew 2 tablets by mouth 3 (three) times daily as needed for indigestion. Takes on Monday, wednesdays, and fridays   cephALEXin 500 MG capsule Commonly known as:  KEFLEX Take 500 mg by mouth 3 (three) times daily.   cetirizine 10 MG tablet Commonly known as:  ZYRTEC Take 10 mg by mouth daily.   cyclobenzaprine 10 MG tablet Commonly known as:  FLEXERIL Take 1 tablet (10 mg total) by mouth 2 (two) times daily as needed for muscle spasms.   diazepam 5 MG tablet Commonly known as:  VALIUM Take 1 tablet (5 mg total) by mouth every 6 (six) hours as needed for anxiety (spasms).   epoetin alfa 10000 UNIT/ML injection Commonly known as:  EPOGEN,PROCRIT Inject 30,000 Units into the skin every 14 (fourteen) days.   famotidine 40 MG tablet Commonly known as:  PEPCID Take 1 tablet (40 mg total) by mouth daily.   fluticasone 50 MCG/ACT nasal spray Commonly known as:  FLONASE Place 2 sprays into both nostrils daily.   furosemide 20 MG tablet Commonly known as:  LASIX Take 20 mg by mouth daily as needed. Feet swelling   gabapentin 100 MG capsule Commonly known as:  NEURONTIN Take 200 mg by mouth daily as needed (for feet).   hydrOXYzine 50 MG tablet Commonly known as:  ATARAX/VISTARIL Take 50 mg by mouth 2 (two) times daily as needed for itching.   insulin degludec 100 UNIT/ML Sopn FlexTouch Pen Commonly known as:  TRESIBA FLEXTOUCH Inject 1.3 mLs (130 Units total) into the skin daily. And pen needles 1/day   loperamide 2 MG capsule Commonly known as:  IMODIUM Take 2 mg by mouth as needed for diarrhea or loose stools. Take 2 tablets as needed for diarrhea.   mycophenolate 360 MG Tbec EC tablet Commonly known as:  MYFORTIC Take 720 mg by mouth 2 (two) times daily.   NOVOLOG FLEXPEN 100 UNIT/ML FlexPen Generic drug:  insulin aspart Inject 15 Units into the skin 2 (two) times daily with a meal. 81-150=10  units 51-200=11 units 201-250=12 units 251-100=13 units 301-350=14 units >351=15 u Per sliding scale   ondansetron 4 MG tablet Commonly known as:  ZOFRAN Take 1 tablet (4 mg total) by mouth every 8 (eight) hours as needed for nausea or vomiting.   oxyCODONE 5 MG immediate release tablet Commonly known as:  Oxy IR/ROXICODONE Take 1 tablet (5 mg total) by mouth every 6 (six) hours as needed for severe pain.   predniSONE 5 MG tablet Commonly known as:  DELTASONE Take 5 mg by mouth at bedtime.   simvastatin 20 MG tablet Commonly known as:  ZOCOR Take 20 mg by mouth 3 (three) times a week. MWF.   tacrolimus 1 MG capsule Commonly known as:  PROGRAF Take 2 mg by mouth 2 (two) times daily.   ZANTAC 150 MG tablet Generic drug:  ranitidine Take 150-300 mg by mouth daily as needed. Acid indigestion      Follow-up Information    [facility] Follow up on 07/14/2017.   Specialty:  General Surgery Why:  Your appointment is at 2:45 PM.  Be at the office 30 minutes early for check in.  Bring photo ID and insurance information.   Contact information: [address] [place]  [id number] [phone]           Signed: [name] 07/02/2017, 10:42 AM

## 2017-07-02 NOTE — ED Provider Notes (Signed)
[facility] Provider Note   CSN: [id number] Arrival date & time: 07/02/17  1611     History   Chief Complaint Chief Complaint  Patient presents with  . Emesis    HPI [name] is a 32 y.o. female with a h/o of DM Type 1, ESRD s/p right renal transplant on Myfortic, prednisone, and Prograf, XXX syndrome with mature ovarian failure, and chronic cholecystitis s/p Lap Chole on 06/30/17 who presents to the emergency department with a chief complaint of abdominal pain.  The patient reports that she was discharged from the hospital yesterday after having a laparoscopic cholecystectomy on 4/18 performed by Dr. Excell Seltzer.  She was successfully PO challenged prior to discharge with fluid and was able to eat chicken.  After discharge, the patient reports continuous nausea, multiple episodes of dark emesis, and constant, worsening diffuse abdominal pain. She had had a BM and has been passing flatus.   She reports she has been unable to keep anything down since she was discharged yesterday.  Her brother reports that she has not filled her home Zofran prescription since discharge.   She also endorses a cough that is intermittently productive that began yesterday.  She denies dyspnea, chest pain, back pain, rash, leg swelling, headache, fever, chills, dysuria, or vaginal pain or discharge.   The history is provided by the patient. No language interpreter was used.  Emesis   Associated symptoms include abdominal pain and cough. Pertinent negatives include no chills, no fever, no headaches and no myalgias.    Past Medical History:  Diagnosis Date  . Chronic kidney disease   . Diabetes mellitus    Pt reports diagnosis in June 2011  . Hyperlipidemia   . Hypertension   . Kidney transplant recipient   . 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 Pyelonephritis 06/23/2014  . UTI (urinary  tract infection) 01/09/2015  . 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 Hematemesis 07/02/2017  . Dehydration 07/02/2017  . Chronic cholecystitis 06/29/2017  . Type 1 diabetes mellitus with complication, uncontrolled ([place]) 05/25/2015  . Intractable nausea and vomiting 01/09/2015  . Renal transplant recipient   . Immunosuppressed status ([place])   . 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 CHRONIC KIDNEY DISEASE STAGE II ([facility]) 01/04/2010  . OVARIAN FAILURE, PREMATURE 03/11/2009  . 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 SECONDARY HYPERPARATHYROIDISM 12/05/2007  . OBESITY 09/25/2007  . Anemia in chronic renal disease 09/25/2007  . LEARNING DISABILITY 09/25/2007  . Essential hypertension, benign 09/25/2007  . KIDNEY TRANSPLANTATION, HX OF 09/25/2007    Past Surgical History:  Procedure Laterality Date  . CHOLECYSTECTOMY N/A 06/30/2017   Procedure: LAPAROSCOPIC CHOLECYSTECTOMY WITH INTRAOPERATIVE CHOLANGIOGRAM;  Surgeon: Excell Seltzer, MD;  Location: [facility];  Service: General;  Laterality: N/A;  . KIDNEY TRANSPLANT  2007  . RENAL BIOPSY Bilateral 2003  . THYROID LOBECTOMY       OB History   None      Home Medications    Prior to Admission medications   Medication Sig Start Date End Date Taking? Authorizing Provider  ACCU-CHEK SOFTCLIX LANCETS lancets Use to check blood sugar 4 times per day. 12/29/15  Yes Renato Shin, MD  acetaminophen (TYLENOL) 500 MG tablet Take 1,000 mg by mouth daily as  needed for moderate pain.    Yes [provider]  calcium carbonate (TUMS) 500 MG chewable tablet Chew 2 tablets by mouth 3 (three) times daily as needed for indigestion. Takes on Monday, wednesdays, and fridays    [provider]  cephALEXin (KEFLEX) 500 MG capsule Take 500 mg by mouth 3 (three) times daily. 06/23/17   [provider]  cetirizine  (ZYRTEC) 10 MG tablet Take 10 mg by mouth daily.    [provider]  cyclobenzaprine (FLEXERIL) 10 MG tablet Take 1 tablet (10 mg total) by mouth 2 (two) times daily as needed for muscle spasms. 07/30/16   Montine Circle, PA-C  diazepam (VALIUM) 5 MG tablet Take 1 tablet (5 mg total) by mouth every 6 (six) hours as needed for anxiety (spasms). 05/11/15   Elnora Morrison, MD  epoetin alfa (EPOGEN,PROCRIT) 27035 UNIT/ML injection Inject 30,000 Units into the skin every 14 (fourteen) days.    [provider]  famotidine (PEPCID) 40 MG tablet Take 1 tablet (40 mg total) by mouth daily. 01/09/17   Caccavale, Sophia, PA-C  fluticasone (FLONASE) 50 MCG/ACT nasal spray Place 2 sprays into both nostrils daily. 07/20/15   Muthersbaugh, Jarrett Soho, PA-C  furosemide (LASIX) 20 MG tablet Take 20 mg by mouth daily as needed. Feet swelling    [provider]  gabapentin (NEURONTIN) 100 MG capsule Take 200 mg by mouth daily as needed (for feet).  11/12/16   [provider]  hydrOXYzine (ATARAX/VISTARIL) 50 MG tablet Take 50 mg by mouth 2 (two) times daily as needed for itching.     [provider]  insulin aspart (NOVOLOG FLEXPEN) 100 UNIT/ML FlexPen Inject 15 Units into the skin 2 (two) times daily with a meal. 81-150=10 units 51-200=11 units 201-250=12 units 251-100=13 units 301-350=14 units >351=15 u Per sliding scale    [provider]  insulin degludec (TRESIBA FLEXTOUCH) 100 UNIT/ML SOPN FlexTouch Pen Inject 1.3 mLs (130 Units total) into the skin daily. And pen needles 1/day 07/22/16   Renato Shin, MD  loperamide (IMODIUM) 2 MG capsule Take 2 mg by mouth as needed for diarrhea or loose stools. Take 2 tablets as needed for diarrhea.    [provider]  mycophenolate (MYFORTIC) 360 MG TBEC Take 720 mg by mouth 2 (two) times daily.     [provider]  ondansetron (ZOFRAN) 4 MG tablet Take 1 tablet (4 mg total) by mouth every 8 (eight) hours as  needed for nausea or vomiting. 06/28/17   Maczis, Barth Kirks, PA-C  oxyCODONE (OXY IR/ROXICODONE) 5 MG immediate release tablet Take 1 tablet (5 mg total) by mouth every 6 (six) hours as needed for severe pain. 07/01/17   [name], MD  predniSONE (DELTASONE) 5 MG tablet Take 5 mg by mouth at bedtime.     [provider]  ranitidine (ZANTAC) 150 MG tablet Take 150-300 mg by mouth daily as needed. Acid indigestion    [provider]  simvastatin (ZOCOR) 20 MG tablet Take 20 mg by mouth 3 (three) times a week. MWF.    [provider]  tacrolimus (PROGRAF) 1 MG capsule Take 2 mg by mouth 2 (two) times daily.     [provider]    Family History Family History  Problem Relation Age of Onset  . Arthritis Mother   . Diabetes Mother   . Hypertension Mother     Social History Social History   Tobacco Use  . Smoking status: Never Smoker  .  Smokeless tobacco: Never Used  Substance Use Topics  . Alcohol use: No  . Drug use: No     Allergies   Benadryl [diphenhydramine-zinc acetate]; Motrin [ibuprofen]; Banana; Diphenhydramine hcl; Doxycycline; Iron dextran; Milk-related compounds; and Shellfish allergy   Review of Systems Review of Systems  Constitutional: Negative for activity change, chills and fever.  HENT: Negative for congestion and sore throat.   Eyes: Negative for visual disturbance.  Respiratory: Positive for cough. Negative for shortness of breath.   Cardiovascular: Negative for chest pain.  Gastrointestinal: Positive for abdominal pain, nausea and vomiting. Negative for abdominal distention, anal bleeding, blood in stool and constipation.  Genitourinary: Negative for dysuria, frequency, urgency and vaginal pain.  Musculoskeletal: Negative for back pain, myalgias and neck pain.  Skin: Negative for rash.  Allergic/Immunologic: Negative for immunocompromised state.  Neurological: Negative for dizziness, weakness, numbness and headaches.    Psychiatric/Behavioral: Negative for confusion.    Physical Exam Updated Vital Signs BP 114/84 (BP Location: Right Arm)   Pulse (!) 107   Temp 97.7 F (36.5 C) (Oral)   Resp 20   Ht 5\' 6"  (1.676 m)   Wt 79.4 kg (175 lb)   SpO2 95%   BMI 28.25 kg/m   Physical Exam  Constitutional:  Tearful, moaning in pain. Ill appearing. Dark reddish/black emesis noted in emesis bag.   HENT:  Head: Normocephalic.  Eyes: Conjunctivae are normal. No scleral icterus.  Neck: Normal range of motion. Neck supple.  Cardiovascular: Normal rate, regular rhythm, normal heart sounds and intact distal pulses. Exam reveals no gallop and no friction rub.  No murmur heard. Pulmonary/Chest: Effort normal. No stridor. No respiratory distress. She has no wheezes. She has no rales.  Abdominal: Soft. Bowel sounds are normal. She exhibits no distension and no mass. There is tenderness. There is guarding. There is no rebound. No hernia.  Significantly tender with minimal palpation in all 4 quadrants with guarding.  There is a vertical midline scar to the abdomen that appears well-healed. No CVA tenderness bilaterally.   Musculoskeletal: Normal range of motion. She exhibits no edema, tenderness or deformity.  Neurological: She is alert.  Skin: Skin is warm. Capillary refill takes less than 2 seconds. No rash noted.  Psychiatric: Her behavior is normal.  Nursing note and vitals reviewed.    ED Treatments / Results  Labs (all labs ordered are listed, but only abnormal results are displayed) Labs Reviewed  COMPREHENSIVE METABOLIC PANEL - Abnormal; Notable for the following components:      Result Value   Potassium 3.2 (*)    Chloride 94 (*)    Glucose, Bld 227 (*)    BUN 28 (*)    Creatinine, Ser 1.67 (*)    Calcium 7.5 (*)    GFR calc non Af Amer 39 (*)    GFR calc Af Amer 46 (*)    Anion gap 16 (*)    All other components within normal limits  CBC - Abnormal; Notable for the following components:   WBC  14.4 (*)    All other components within normal limits  MAGNESIUM - Abnormal; Notable for the following components:   Magnesium 1.3 (*)    All other components within normal limits  CBG MONITORING, ED - Abnormal; Notable for the following components:   Glucose-Capillary 206 (*)    All other components within normal limits  CBG MONITORING, ED - Abnormal; Notable for the following components:   Glucose-Capillary 134 (*)    All other  components within normal limits  LIPASE, BLOOD  PHOSPHORUS  URINALYSIS, ROUTINE W REFLEX MICROSCOPIC  DIFFERENTIAL  HEMOGLOBIN A1C  MAGNESIUM  PHOSPHORUS  TSH  COMPREHENSIVE METABOLIC PANEL  CBC    EKG None  Radiology Ct Abdomen Pelvis Wo Contrast  Result Date: 07/02/2017 CLINICAL DATA:  Cholecystectomy 06/30/2017. Possible postoperative complication suspected. Renal transplant 2007. EXAM: CT ABDOMEN AND PELVIS WITHOUT CONTRAST TECHNIQUE: Multidetector CT imaging of the abdomen and pelvis was performed following the standard protocol without IV contrast. COMPARISON:  CT of the abdomen and pelvis 12/10/2016 FINDINGS: Lower chest: There is subsegmental atelectasis at both lung bases. Trace pericardial effusion. Hepatobiliary: The liver is homogeneous without focal abnormality. Surgical clips are seen in the gallbladder fossa region. There is a small fluid collection in the gallbladder fossa, containing air and gas. This is crescentic, measuring 5.7 x 1.4 by 3.2 centimeters. Pancreas: Unremarkable. No pancreatic ductal dilatation or surrounding inflammatory changes. Spleen: Normal in size without focal abnormality. Adrenals/Urinary Tract: Adrenal glands are normal. LEFT kidney is atrophic. RIGHT native kidney is not identified. RIGHT iliac fossa renal transplant appears stable compared to prior study. Transplant kidney appears enlarged and contains focal low-attenuation areas within the MEDIAL aspect and subcapsular midpole region, all stable findings. There is  stable mild prominence of the renal pelvis and stranding in the region of the hilum. Normal appearance of the urinary bladder. Stomach/Bowel: Small hiatal hernia. Stomach is otherwise normal. Small bowel loops are normal in appearance. The appendix is well seen and has a normal appearance. Loops of colon are normal in appearance. Vascular/Lymphatic: No significant vascular findings are present. No enlarged abdominal or pelvic lymph nodes. Reproductive: Hysterectomy.  No adnexal mass. Other: No abdominal wall hernia or abnormality. No abdominopelvic ascites. Postoperative changes are identified in the anterior abdominal wall. Subcutaneous and small amount of intraperitoneal gas remain following recent surgery. Musculoskeletal: No acute or significant osseous findings. IMPRESSION: 1. Crescentic fluid and gas attenuation in the region of the gallbladder fossa, measuring 5.7 x 1.4 x 3.2 centimeters. 2. Stable appearance of RIGHT iliac fossa renal transplant. 3. Small hiatal hernia. 4. Normal appendix. 5. Hysterectomy. 6. Postoperative changes in the anterior abdominal wall. Electronically Signed   By: Nolon Nations M.D.   On: 07/02/2017 19:12   Dg Chest 2 View  Result Date: 07/02/2017 CLINICAL DATA:  Postop cholecystectomy.  Cough. EXAM: CHEST - 2 VIEW COMPARISON:  01/13/2017 FINDINGS: Mild cardiomegaly. Low lung volumes. No confluent opacities or effusions. No acute bony abnormality. IMPRESSION: Mild cardiomegaly.  No active disease. Electronically Signed   By: Rolm Baptise M.D.   On: 07/02/2017 18:13    Procedures Procedures (including critical care time)  Medications Ordered in ED Medications  acetaminophen (TYLENOL) tablet 650 mg (has no administration in time range)    Or  acetaminophen (TYLENOL) suppository 650 mg (has no administration in time range)  ondansetron (ZOFRAN) tablet 4 mg (has no administration in time range)    Or  ondansetron (ZOFRAN) injection 4 mg (has no administration in time  range)  insulin glargine (LANTUS) injection 40 Units (40 Units Subcutaneous Given 07/03/17 0234)  insulin aspart (novoLOG) injection 0-9 Units (1 Units Subcutaneous Given 07/03/17 0234)  0.9 %  sodium chloride infusion ( Intravenous New Bag/Given 07/03/17 0100)  potassium chloride 10 mEq in 100 mL IVPB (10 mEq Intravenous New Bag/Given 07/03/17 0234)  hydrocortisone sodium succinate (SOLU-CORTEF) 100 MG injection 25 mg (25 mg Intravenous Given 07/03/17 0001)  tacrolimus (PROGRAF) capsule 2 mg (2 mg Oral  Given 07/03/17 0007)  HYDROmorphone (DILAUDID) injection 0.5 mg (0.5 mg Intravenous Given 07/03/17 0123)  famotidine (PEPCID) IVPB 20 mg premix (20 mg Intravenous Not Given 07/03/17 0123)  ondansetron (ZOFRAN) injection 4 mg (4 mg Intravenous Given 07/02/17 1715)  HYDROmorphone (DILAUDID) injection 1 mg (1 mg Intravenous Given 07/02/17 1714)  sodium chloride 0.9 % bolus 1,000 mL (0 mLs Intravenous Stopped 07/02/17 1959)  HYDROmorphone (DILAUDID) injection 1 mg (1 mg Intravenous Given 07/02/17 1813)  famotidine (PEPCID) IVPB 20 mg premix (0 mg Intravenous Stopped 07/02/17 1843)  sodium chloride 0.9 % bolus 1,000 mL (0 mLs Intravenous Stopped 07/02/17 2137)  HYDROmorphone (DILAUDID) injection 1 mg (1 mg Intravenous Given 07/02/17 1959)  promethazine (PHENERGAN) injection 25 mg (25 mg Intravenous Given 07/02/17 2133)  magnesium sulfate IVPB 2 g 50 mL (0 g Intravenous Stopped 07/03/17 0101)  HYDROmorphone (DILAUDID) injection 1 mg (1 mg Intravenous Given 07/03/17 0251)     Initial Impression / Assessment and Plan / ED Course  I have reviewed the triage vital signs and the nursing notes.  Pertinent labs & imaging results that were available during my care of the patient were reviewed by me and considered in my medical decision making (see chart for details).     32 year old female with a complicated medical history including DM Type 1, ESRD s/p right renal transplant on Myfortic, prednisone, and Prograf, XXX  syndrome with mature ovarian failure, and chronic cholecystitis s/p Lap Chole on 06/30/17.  She has had post-operative nausea and vomiting and severe abdominal pain since she was discharged home yesterday.  On physical exam, she is exquisitely tender with guarding throughout the abdomen with minimal palpation. She is tearful and uncomfortable appearing.  She was seen and evaluated along with Dr. Darl Householder, attending physician.  She reports that she has been unable to keep down all of her home medications since yesterday morning.  Leukocytosis of 14.4, uptrending from 9.8 on 4/18. This could be secondary to recent low dose IV steroids she was given during recent admission versus infective process.  Afebrile on arrival.  Tachycardic in the 100s.  SaO2 would fluctuate from 95 with good waveform on the monitor to 85 before quickly returning to 95 given recent onset of vomiting, chest x-ray was ordered to evaluate for aspiration pneumonia, which was negative.  CT abdomen pelvis with cross enteric fluid and gas attenuation in the region of the gallbladder fossa and postoperative changes in the anterior abdominal wall.  Spoke with Dr. Barry Dienes, general surgeon, who has a low suspicion that the fluid and gas attenuation is a hematoma or abscess.  She feels this is most likely consistent with residual CO2 used during the procedure since she is post-op day 2. General surgery will follow the patient after she is admitted to medicine.   Pain controlled in the ED with Dilaudid.  She was initially given Zofran with resolution of her vomiting, but continued to have nausea so she was given Phenergan.  Prior to antiemetics, an EKG was performed which did not demonstrate prolonged QTC.  She was also given Pepcid in her home Tx medications were ordered.   Consulted Dr. Roel Cluck, hospitalist, who will accept the patient for admission. Given concern for hematemesis, she recommended GI be consulted.  Differential diagnosis includes  Mallory-Weiss tear versus PUD.  Spoke with Dr. Tomie China, GI. GI will follow the patient. We discussed there her hgb was stable at  12.4 and BUN/Cr were 28/1.67. No recommendations were given at this time.   The  patient appears reasonably stabilized for admission considering the current resources, flow, and capabilities available in the ED at this time, and I doubt any other [facility] requiring further screening and/or treatment in the ED prior to admission.  Final Clinical Impressions(s) / ED Diagnoses   Final diagnoses:  Postoperative nausea and vomiting  Dehydration    ED Discharge Orders    None       Joanne Gavel, PA-C 07/03/17 0307    Drenda Freeze, MD 07/03/17 [phone]

## 2017-07-03 ENCOUNTER — Encounter (HOSPITAL_COMMUNITY): Payer: Self-pay | Admitting: Gastroenterology

## 2017-07-03 ENCOUNTER — Other Ambulatory Visit: Payer: Self-pay

## 2017-07-03 DIAGNOSIS — D631 Anemia in chronic kidney disease: Secondary | ICD-10-CM

## 2017-07-03 DIAGNOSIS — N183 Chronic kidney disease, stage 3 (moderate): Secondary | ICD-10-CM

## 2017-07-03 DIAGNOSIS — E1065 Type 1 diabetes mellitus with hyperglycemia: Secondary | ICD-10-CM

## 2017-07-03 DIAGNOSIS — N182 Chronic kidney disease, stage 2 (mild): Secondary | ICD-10-CM

## 2017-07-03 DIAGNOSIS — I10 Essential (primary) hypertension: Secondary | ICD-10-CM

## 2017-07-03 DIAGNOSIS — R112 Nausea with vomiting, unspecified: Secondary | ICD-10-CM

## 2017-07-03 DIAGNOSIS — E108 Type 1 diabetes mellitus with unspecified complications: Secondary | ICD-10-CM

## 2017-07-03 DIAGNOSIS — K920 Hematemesis: Principal | ICD-10-CM

## 2017-07-03 DIAGNOSIS — Z940 Kidney transplant status: Secondary | ICD-10-CM

## 2017-07-03 LAB — COMPREHENSIVE METABOLIC PANEL
ALK PHOS: 56 U/L (ref 38–126)
ALT: 22 U/L (ref 14–54)
ANION GAP: 10 (ref 5–15)
AST: 17 U/L (ref 15–41)
Albumin: 2.9 g/dL — ABNORMAL LOW (ref 3.5–5.0)
BUN: 29 mg/dL — ABNORMAL HIGH (ref 6–20)
CALCIUM: 6.1 mg/dL — AB (ref 8.9–10.3)
CO2: 28 mmol/L (ref 22–32)
Chloride: 104 mmol/L (ref 101–111)
Creatinine, Ser: 1.69 mg/dL — ABNORMAL HIGH (ref 0.44–1.00)
GFR, EST AFRICAN AMERICAN: 45 mL/min — AB (ref >60)
GFR, EST NON AFRICAN AMERICAN: 39 mL/min — AB (ref >60)
Glucose, Bld: 178 mg/dL — ABNORMAL HIGH (ref 65–99)
Potassium: 3.9 mmol/L (ref 3.5–5.1)
Sodium: 142 mmol/L (ref 135–145)
Total Bilirubin: 0.5 mg/dL (ref 0.3–1.2)
Total Protein: 6 g/dL — ABNORMAL LOW (ref 6.5–8.1)

## 2017-07-03 LAB — MAGNESIUM: MAGNESIUM: 2.2 mg/dL (ref 1.7–2.4)

## 2017-07-03 LAB — CBC
HCT: 29.1 % — ABNORMAL LOW (ref 36.0–46.0)
HEMOGLOBIN: 9.1 g/dL — AB (ref 12.0–15.0)
MCH: 28.5 pg (ref 26.0–34.0)
MCHC: 31.3 g/dL (ref 30.0–36.0)
MCV: 91.2 fL (ref 78.0–100.0)
PLATELETS: 228 10*3/uL (ref 150–400)
RBC: 3.19 MIL/uL — AB (ref 3.87–5.11)
RDW: 15.5 % (ref 11.5–15.5)
WBC: 11.9 10*3/uL — AB (ref 4.0–10.5)

## 2017-07-03 LAB — PHOSPHORUS: Phosphorus: 3.9 mg/dL (ref 2.5–4.6)

## 2017-07-03 LAB — GLUCOSE, CAPILLARY
GLUCOSE-CAPILLARY: 182 mg/dL — AB (ref 65–99)
Glucose-Capillary: 194 mg/dL — ABNORMAL HIGH (ref 65–99)
Glucose-Capillary: 250 mg/dL — ABNORMAL HIGH (ref 65–99)
Glucose-Capillary: 237 mg/dL — ABNORMAL HIGH (ref 65–99)
Glucose-Capillary: 243 mg/dL — ABNORMAL HIGH (ref 65–99)

## 2017-07-03 LAB — DIFFERENTIAL
BASOS ABS: 0 10*3/uL (ref 0.0–0.1)
BASOS PCT: 0 %
Eosinophils Absolute: 0.2 10*3/uL (ref 0.0–0.7)
Eosinophils Relative: 2 %
Lymphocytes Relative: 14 %
Lymphs Abs: 1.6 10*3/uL (ref 0.7–4.0)
MONOS PCT: 8 %
Monocytes Absolute: 0.9 10*3/uL (ref 0.1–1.0)
NEUTROS ABS: 9.1 10*3/uL — AB (ref 1.7–7.7)
NEUTROS PCT: 76 %

## 2017-07-03 LAB — TSH: TSH: 0.477 u[IU]/mL (ref 0.350–4.500)

## 2017-07-03 LAB — CBG MONITORING, ED: GLUCOSE-CAPILLARY: 134 mg/dL — AB (ref 65–99)

## 2017-07-03 LAB — HEMOGLOBIN A1C
HEMOGLOBIN A1C: 8.1 % — AB (ref 4.8–5.6)
MEAN PLASMA GLUCOSE: 185.77 mg/dL

## 2017-07-03 MED ORDER — FAMOTIDINE 20 MG PO TABS
20.0000 mg | ORAL_TABLET | Freq: Two times a day (BID) | ORAL | Status: DC
  Administered 2017-07-03 – 2017-07-05 (×4): 20 mg via ORAL
  Filled 2017-07-03 (×4): qty 1

## 2017-07-03 MED ORDER — OXYCODONE-ACETAMINOPHEN 5-325 MG PO TABS
1.0000 | ORAL_TABLET | ORAL | Status: DC | PRN
  Administered 2017-07-03 (×2): 1 via ORAL
  Administered 2017-07-03 – 2017-07-05 (×4): 2 via ORAL
  Filled 2017-07-03: qty 1
  Filled 2017-07-03 (×2): qty 2
  Filled 2017-07-03: qty 1
  Filled 2017-07-03 (×3): qty 2

## 2017-07-03 MED ORDER — ACETAMINOPHEN 325 MG PO TABS: 650.0000 mg | ORAL_TABLET | Freq: Four times a day (QID) | ORAL | Status: DC | PRN

## 2017-07-03 MED ORDER — HYDROMORPHONE HCL 1 MG/ML IJ SOLN
1.0000 mg | Freq: Once | INTRAMUSCULAR | Status: AC
  Administered 2017-07-03: 1 mg via INTRAVENOUS
  Filled 2017-07-03: qty 1

## 2017-07-03 MED ORDER — HYDROMORPHONE HCL 1 MG/ML IJ SOLN
0.5000 mg | INTRAMUSCULAR | Status: DC | PRN
  Administered 2017-07-03 (×2): 0.5 mg via INTRAVENOUS
  Filled 2017-07-03: qty 1
  Filled 2017-07-03: qty 0.5

## 2017-07-03 MED ORDER — ACETAMINOPHEN 650 MG RE SUPP: 650.0000 mg | Freq: Four times a day (QID) | RECTAL | Status: DC | PRN

## 2017-07-03 MED ORDER — SODIUM CHLORIDE 0.9 % IV SOLN
INTRAVENOUS | Status: AC
  Administered 2017-07-03 (×2): via INTRAVENOUS

## 2017-07-03 MED ORDER — INSULIN GLARGINE 100 UNIT/ML ~~LOC~~ SOLN
40.0000 [IU] | Freq: Every day | SUBCUTANEOUS | Status: DC
  Administered 2017-07-03 – 2017-07-04 (×3): 40 [IU] via SUBCUTANEOUS
  Filled 2017-07-03 (×4): qty 0.4

## 2017-07-03 MED ORDER — FAMOTIDINE IN NACL 20-0.9 MG/50ML-% IV SOLN
20.0000 mg | Freq: Two times a day (BID) | INTRAVENOUS | Status: DC
  Administered 2017-07-03: 20 mg via INTRAVENOUS
  Filled 2017-07-03: qty 50

## 2017-07-03 MED ORDER — PREDNISONE 5 MG PO TABS
5.0000 mg | ORAL_TABLET | Freq: Every day | ORAL | Status: DC
  Administered 2017-07-04 – 2017-07-05 (×2): 5 mg via ORAL
  Filled 2017-07-03 (×2): qty 1

## 2017-07-03 MED ORDER — INSULIN ASPART 100 UNIT/ML ~~LOC~~ SOLN
0.0000 [IU] | SUBCUTANEOUS | Status: DC
  Administered 2017-07-03 (×2): 3 [IU] via SUBCUTANEOUS
  Administered 2017-07-03: 2 [IU] via SUBCUTANEOUS
  Administered 2017-07-03: 3 [IU] via SUBCUTANEOUS
  Administered 2017-07-03: 1 [IU] via SUBCUTANEOUS
  Administered 2017-07-03: 2 [IU] via SUBCUTANEOUS
  Administered 2017-07-04: 3 [IU] via SUBCUTANEOUS
  Administered 2017-07-04: 2 [IU] via SUBCUTANEOUS
  Administered 2017-07-04: 1 [IU] via SUBCUTANEOUS
  Administered 2017-07-04: 5 [IU] via SUBCUTANEOUS
  Administered 2017-07-04: 3 [IU] via SUBCUTANEOUS
  Administered 2017-07-05: 2 [IU] via SUBCUTANEOUS
  Administered 2017-07-05: 1 [IU] via SUBCUTANEOUS
  Administered 2017-07-05: 2 [IU] via SUBCUTANEOUS
  Administered 2017-07-05: 1 [IU] via SUBCUTANEOUS

## 2017-07-03 MED ORDER — TACROLIMUS 1 MG PO CAPS: 2.0000 mg | ORAL_CAPSULE | Freq: Two times a day (BID) | ORAL | Status: DC

## 2017-07-03 MED ORDER — HYDROCORTISONE NA SUCCINATE PF 100 MG IJ SOLR
25.0000 mg | Freq: Three times a day (TID) | INTRAMUSCULAR | Status: AC
  Administered 2017-07-03 (×2): 25 mg via INTRAVENOUS
  Filled 2017-07-03 (×2): qty 2

## 2017-07-03 MED ORDER — MYCOPHENOLATE SODIUM 180 MG PO TBEC
720.0000 mg | DELAYED_RELEASE_TABLET | Freq: Two times a day (BID) | ORAL | Status: DC
  Administered 2017-07-03: 720 mg via ORAL
  Administered 2017-07-04: 540 mg via ORAL
  Administered 2017-07-04 – 2017-07-05 (×2): 720 mg via ORAL
  Filled 2017-07-03 (×5): qty 4

## 2017-07-03 MED ORDER — ONDANSETRON HCL 4 MG PO TABS: 4.0000 mg | ORAL_TABLET | Freq: Four times a day (QID) | ORAL | Status: DC | PRN

## 2017-07-03 MED ORDER — SIMVASTATIN 20 MG PO TABS
20.0000 mg | ORAL_TABLET | ORAL | Status: DC
  Administered 2017-07-04: 20 mg via ORAL
  Filled 2017-07-03: qty 1

## 2017-07-03 MED ORDER — ONDANSETRON HCL 4 MG/2ML IJ SOLN: 4.0000 mg | Freq: Four times a day (QID) | INTRAMUSCULAR | Status: DC | PRN

## 2017-07-03 MED ORDER — POTASSIUM CHLORIDE 10 MEQ/100ML IV SOLN
INTRAVENOUS | Status: AC
  Administered 2017-07-03: 10 meq
  Filled 2017-07-03: qty 100

## 2017-07-03 NOTE — Progress Notes (Signed)
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  Patient presented to the emergency department complaining of nausea and vomiting since she was discharged on 4/19.  Vomited was dark like coffee ground which was worrisome for blood.  Patient was unable to keep fluids or food down and came to the emergency department for further evaluation.  Other associated symptoms include abdominal pain and occasional cough.  Patient was admitted with working diagnosis of intractable nausea and vomiting complicated with hematemesis.  Subjective: Patient seen and examined she is tolerating oral diet well.  Mild abdominal pain.  No nausea or vomiting today.  Patient ambulating with no issues.  Assessment & Plan: Hematemesis In setting of intractable nausea and vomiting, concern for possible Mallory-Weiss tear.  GI has been consulted and input appreciated.  Patient for EGD in a.m.  Diet has been advanced and patient tolerated well.  Continue Zofran as needed  Intractable nausea and vomiting  CT of the abdomen showed no evidence of obstruction, suspect to be postop complication.  Unlikely to be adrenal insufficiency with lack of electrolytes abnormalities and normal vital signs.  Patient has remained on steroids.  Continue supportive treatment.  Chronic kidney disease/status post renal transplant in 2007 Case was discussed with nephrology Myfortic was held overnight now that she is tolerating diet will resume Myfortic and Prograf.  Patient was treated with IV hydrocortisone will transition to oral prednisone since she is tolerating oral diet.  Creatinine stable.  Essential hypertension BP  stable Continue home medications  Status post cholecystectomy 4/19 Surgical input appreciated  Type 1 diabetes mellitus uncontrolled with hyperglycemia Monitor CBGs, continue Lantus at current dose since patient will be n.p.o. after midnight. Continue SSI monitor  Anemia of chronic disease from CKD Hemoglobin stable No signs of overt bleeding  DVT prophylaxis: SCDs Code Status: Full code Family Communication: Brother via telephone Disposition Plan: Home if EGD negative  Consultants:   GI  General surgery  Nephrology  Procedures:   None  Antimicrobials:  None   Objective: Vitals:   07/03/17 0451 07/03/17 0507 07/03/17 1033 07/03/17 1257  BP: (!) 89/63 104/60 106/83 96/72  Pulse: (!) 102  95 90  Resp: 18  12 12   Temp: 98.3 F (36.8 C)  99 F (37.2 C) 97.9 F (36.6 C)  TempSrc: Oral  Oral Oral  SpO2: 94%  96% 91%  Weight:  78.9 kg (174 lb)    Height:  5\' 6"  (1.676 m)      Intake/Output Summary (Last 24 hours) at 07/03/2017 1636 Last data filed at 07/03/2017 1050 Gross per 24 hour  Intake 2752.5 ml  Output -  Net 2752.5 ml   Filed Weights   07/02/17 1637 07/03/17 0507  Weight: 79.4 kg (175 lb) 78.9 kg (174 lb)    Examination:  General exam: Appears calm and comfortable  HEENT: AC/AT, PERRLA, OP moist and clear Respiratory system: Clear to auscultation. No wheezes,crackle or rhonchi Cardiovascular system: S1 & S2 heard, RRR. No JVD, murmurs, rubs or gallops Gastrointestinal system: Abdomen is soft, nondistended mild diffuse tenderness.+ Bowel sounds Central nervous system: Alert and oriented. No focal neurological deficits. Extremities: No pedal edema.  Skin: No rashes, lesions or ulcers Psychiatry: Mood &  affect appropriate.    Data Reviewed: I have personally reviewed following labs and imaging studies  CBC: Recent Labs  Lab 06/28/17 1419 06/29/17 1655 06/30/17 0421 07/02/17 1722 07/03/17 0458  WBC 11.5* 10.1 9.8 14.4* 11.9*   NEUTROABS 9.4*  --   --   --  9.1*  HGB 10.7* 10.7* 10.0* 12.4 9.1*  HCT 33.8* 34.2* 32.0* 39.3 29.1*  MCV 88.5 87.9 89.1 88.7 91.2  PLT 288 293 276 285 130   Basic Metabolic Panel: Recent Labs  Lab 06/28/17 1419 06/29/17 1655 06/30/17 0421 07/02/17 1722 07/03/17 0458  NA 140 137 140 140 142  K 4.5 3.5 3.9 3.2* 3.9  CL 102 103 106 94* 104  CO2 24 23 22 30 28   GLUCOSE 333* 240* 98 227* 178*  BUN 40* 44* 38* 28* 29*  CREATININE 2.19* 2.34* 1.87* 1.67* 1.69*  CALCIUM 7.9* 7.3* 7.0* 7.5* 6.1*  MG  --   --  1.5* 1.3* 2.2  PHOS  --   --  4.4 4.4 3.9   GFR: Estimated Creatinine Clearance: 50.2 mL/min (A) (by C-G formula based on SCr of 1.69 mg/dL (H)). Liver Function Tests: Recent Labs  Lab 06/28/17 1419 06/29/17 1655 06/30/17 0421 07/02/17 1722 07/03/17 0458  AST 15 16 16 23 17   ALT 14 15 14 31 22   ALKPHOS 75 73 67 79 56  BILITOT 0.8 0.7 0.7 1.1 0.5  PROT 7.9 8.0 7.4 8.1 6.0*  ALBUMIN 3.9 3.8 3.4* 3.9 2.9*   Recent Labs  Lab 06/27/17 2045 06/28/17 1419 06/29/17 1655 07/02/17 1722  LIPASE 27 26 30 31    No results for input(s): AMMONIA in the last 168 hours. Coagulation Profile: No results for input(s): INR, PROTIME in the last 168 hours. Cardiac Enzymes: No results for input(s): CKTOTAL, CKMB, CKMBINDEX, TROPONINI in the last 168 hours. BNP (last 3 results) No results for input(s): PROBNP in the last 8760 hours. HbA1C: Recent Labs    07/03/17 0458  HGBA1C 8.1*   CBG: Recent Labs  Lab 07/02/17 1727 07/03/17 0143 07/03/17 0454 07/03/17 0726 07/03/17 1148  GLUCAP 206* 134* 194* 182* 250*   Lipid Profile: No results for input(s): CHOL, HDL, LDLCALC, TRIG, CHOLHDL, LDLDIRECT in the last 72 hours. Thyroid Function Tests: Recent Labs    07/03/17 0458  TSH 0.477   Anemia Panel: No results for input(s): VITAMINB12, FOLATE, FERRITIN, TIBC, IRON, RETICCTPCT in the last 72 hours. Sepsis Labs: No results for input(s): PROCALCITON, LATICACIDVEN in the  last 168 hours.  Recent Results (from the past 240 hour(s))  MRSA PCR Screening     Status: None   Collection Time: 06/30/17  5:42 AM  Result Value Ref Range Status   MRSA by PCR NEGATIVE NEGATIVE Final    Comment:        The GeneXpert MRSA Assay (FDA approved for NASAL specimens only), is one component of a comprehensive MRSA colonization surveillance program. It is not intended to diagnose MRSA infection nor to guide or monitor treatment for MRSA infections. Performed at [facility], [place] [address] [place], [place] [address]       Radiology Studies: Ct Abdomen Pelvis Wo Contrast  Result Date: 07/02/2017 CLINICAL DATA:  Cholecystectomy 06/30/2017. Possible postoperative complication suspected. Renal transplant 2007. EXAM: CT ABDOMEN AND PELVIS WITHOUT CONTRAST TECHNIQUE: Multidetector CT imaging of the abdomen and pelvis was performed following the standard protocol without IV contrast. COMPARISON:  CT of the abdomen and pelvis 12/10/2016 FINDINGS: Lower chest: There is subsegmental  atelectasis at both lung bases. Trace pericardial effusion. Hepatobiliary: The liver is homogeneous without focal abnormality. Surgical clips are seen in the gallbladder fossa region. There is a small fluid collection in the gallbladder fossa, containing air and gas. This is crescentic, measuring 5.7 x 1.4 by 3.2 centimeters. Pancreas: Unremarkable. No pancreatic ductal dilatation or surrounding inflammatory changes. Spleen: Normal in size without focal abnormality. Adrenals/Urinary Tract: Adrenal glands are normal. LEFT kidney is atrophic. RIGHT native kidney is not identified. RIGHT iliac fossa renal transplant appears stable compared to prior study. Transplant kidney appears enlarged and contains focal low-attenuation areas within the MEDIAL aspect and subcapsular midpole region, all stable findings. There is stable mild prominence of the renal pelvis and stranding in the region of the  hilum. Normal appearance of the urinary bladder. Stomach/Bowel: Small hiatal hernia. Stomach is otherwise normal. Small bowel loops are normal in appearance. The appendix is well seen and has a normal appearance. Loops of colon are normal in appearance. Vascular/Lymphatic: No significant vascular findings are present. No enlarged abdominal or pelvic lymph nodes. Reproductive: Hysterectomy.  No adnexal mass. Other: No abdominal wall hernia or abnormality. No abdominopelvic ascites. Postoperative changes are identified in the anterior abdominal wall. Subcutaneous and small amount of intraperitoneal gas remain following recent surgery. Musculoskeletal: No acute or significant osseous findings. IMPRESSION: 1. Crescentic fluid and gas attenuation in the region of the gallbladder fossa, measuring 5.7 x 1.4 x 3.2 centimeters. 2. Stable appearance of RIGHT iliac fossa renal transplant. 3. Small hiatal hernia. 4. Normal appendix. 5. Hysterectomy. 6. Postoperative changes in the anterior abdominal wall. Electronically Signed   By: Nolon Nations M.D.   On: 07/02/2017 19:12   Dg Chest 2 View  Result Date: 07/02/2017 CLINICAL DATA:  Postop cholecystectomy.  Cough. EXAM: CHEST - 2 VIEW COMPARISON:  01/13/2017 FINDINGS: Mild cardiomegaly. Low lung volumes. No confluent opacities or effusions. No acute bony abnormality. IMPRESSION: Mild cardiomegaly.  No active disease. Electronically Signed   By: Rolm Baptise M.D.   On: 07/02/2017 18:13      Scheduled Meds: . hydrocortisone sod succinate (SOLU-CORTEF) inj  25 mg Intravenous Q8H  . insulin aspart  0-9 Units Subcutaneous Q4H  . insulin glargine  40 Units Subcutaneous QHS  . [START ON 07/04/2017] predniSONE  5 mg Oral Q breakfast  . tacrolimus  2 mg Oral BID   Continuous Infusions: . famotidine (PEPCID) IV Stopped (07/03/17 1040)     LOS: 1 day    Time spent: Total of 25 minutes spent with pt, greater than 50% of which was spent in discussion of  treatment,  counseling and coordination of care  Chipper Oman, MD Pager: Text Page via [email or web address]   If 7PM-7AM, please contact night-coverage [email or web address] 07/03/2017, 4:36 PM   Note - This record has been created using [employer]. Chart creation errors have been sought, but may not always have been located. Such creation errors do not reflect on the standard of medical care.

## 2017-07-03 NOTE — Progress Notes (Signed)
Subjective/Chief Complaint: Doing much better, hungry.  No more nausea.  Still a little sore.    Objective: Vital signs in last 24 hours: Temp:  [97.7 F (36.5 C)-99 F (37.2 C)] 98.3 F (36.8 C) (04/21 0451) Pulse Rate:  [96-123] 102 (04/21 0451) Resp:  [12-22] 18 (04/21 0451) BP: (89-128)/(60-99) 104/60 (04/21 0507) SpO2:  [90 %-100 %] 94 % (04/21 0451) Weight:  [78.9 kg (174 lb)-79.4 kg (175 lb)] 78.9 kg (174 lb) (04/21 0507) Last BM Date: 07/03/17  Intake/Output from previous day: 04/20 0701 - 04/21 0700 In: 2392.5 [P.O.:480; I.V.:512.5; IV Piggyback:1400] Out: -  Intake/Output this shift: No intake/output data recorded.  General appearance: alert, cooperative and no distress Resp: breathing comfortably GI: soft, minimal RUQ tendernss, non distended.  no rebound or guarding.    Lab Results:  Recent Labs    07/02/17 1722 07/03/17 0458  WBC 14.4* 11.9*  HGB 12.4 9.1*  HCT 39.3 29.1*  PLT 285 228   BMET Recent Labs    07/02/17 1722 07/03/17 0458  NA 140 142  K 3.2* 3.9  CL 94* 104  CO2 30 28  GLUCOSE 227* 178*  BUN 28* 29*  CREATININE 1.67* 1.69*  CALCIUM 7.5* 6.1*   PT/INR No results for input(s): LABPROT, INR in the last 72 hours. ABG No results for input(s): PHART, HCO3 in the last 72 hours.  Invalid input(s): PCO2, PO2  Studies/Results: Ct Abdomen Pelvis Wo Contrast  Result Date: 07/02/2017 CLINICAL DATA:  Cholecystectomy 06/30/2017. Possible postoperative complication suspected. Renal transplant 2007. EXAM: CT ABDOMEN AND PELVIS WITHOUT CONTRAST TECHNIQUE: Multidetector CT imaging of the abdomen and pelvis was performed following the standard protocol without IV contrast. COMPARISON:  CT of the abdomen and pelvis 12/10/2016 FINDINGS: Lower chest: There is subsegmental atelectasis at both lung bases. Trace pericardial effusion. Hepatobiliary: The liver is homogeneous without focal abnormality. Surgical clips are seen in the gallbladder fossa  region. There is a small fluid collection in the gallbladder fossa, containing air and gas. This is crescentic, measuring 5.7 x 1.4 by 3.2 centimeters. Pancreas: Unremarkable. No pancreatic ductal dilatation or surrounding inflammatory changes. Spleen: Normal in size without focal abnormality. Adrenals/Urinary Tract: Adrenal glands are normal. LEFT kidney is atrophic. RIGHT native kidney is not identified. RIGHT iliac fossa renal transplant appears stable compared to prior study. Transplant kidney appears enlarged and contains focal low-attenuation areas within the MEDIAL aspect and subcapsular midpole region, all stable findings. There is stable mild prominence of the renal pelvis and stranding in the region of the hilum. Normal appearance of the urinary bladder. Stomach/Bowel: Small hiatal hernia. Stomach is otherwise normal. Small bowel loops are normal in appearance. The appendix is well seen and has a normal appearance. Loops of colon are normal in appearance. Vascular/Lymphatic: No significant vascular findings are present. No enlarged abdominal or pelvic lymph nodes. Reproductive: Hysterectomy.  No adnexal mass. Other: No abdominal wall hernia or abnormality. No abdominopelvic ascites. Postoperative changes are identified in the anterior abdominal wall. Subcutaneous and small amount of intraperitoneal gas remain following recent surgery. Musculoskeletal: No acute or significant osseous findings. IMPRESSION: 1. Crescentic fluid and gas attenuation in the region of the gallbladder fossa, measuring 5.7 x 1.4 x 3.2 centimeters. 2. Stable appearance of RIGHT iliac fossa renal transplant. 3. Small hiatal hernia. 4. Normal appendix. 5. Hysterectomy. 6. Postoperative changes in the anterior abdominal wall. Electronically Signed   By: Nolon Nations M.D.   On: 07/02/2017 19:12   Dg Chest 2 View  Result  Date: 07/02/2017 CLINICAL DATA:  Postop cholecystectomy.  Cough. EXAM: CHEST - 2 VIEW COMPARISON:  01/13/2017  FINDINGS: Mild cardiomegaly. Low lung volumes. No confluent opacities or effusions. No acute bony abnormality. IMPRESSION: Mild cardiomegaly.  No active disease. Electronically Signed   By: Rolm Baptise M.D.   On: 07/02/2017 18:13    Anti-infectives: Anti-infectives (From admission, onward)   None      Assessment/Plan: S/p lap chole with normal cholangiogram 4/18- [facility], readmitted for n/v/hematemesis  Looks much better. LFTs normal. ? Relative adrenal insufficiency for cause of n/v.   Try soft foods/full liquids today.   Do not think based on clinical picture today that we need additional imaging.      LOS: 1 day    [name] 07/03/2017

## 2017-07-03 NOTE — ED Notes (Signed)
ED TO INPATIENT HANDOFF REPORT  Name/Age/Gender [name] 32 y.o. female  Code Status    Code Status Orders  (From admission, onward)        Start     Ordered   07/03/17 0059  Full code  Continuous     07/03/17 0058    Code Status History    Date Active Date Inactive Code Status Order ID Comments User Context   06/29/2017 2113 07/01/2017 1423 Full Code [id number]  Johnathan Hausen, MD Inpatient   12/11/2016 2212 12/18/2016 0036 Full Code [id number]  Vianne Bulls, MD ED   01/09/2015 1655 01/12/2015 2123 Full Code [id number]  Willia Craze, NP Inpatient   01/05/2015 1855 01/07/2015 1530 Full Code [id number]  Florencia Reasons, MD Inpatient   09/30/2014 1313 10/01/2014 2243 Full Code [id number]  Truett Mainland, DO Inpatient   06/24/2014 0031 06/25/2014 2337 Full Code [id number]  Rise Patience, MD Inpatient   12/22/2012 2251 12/23/2012 2223 Full Code [id number]  Toy Baker, MD Inpatient      Home/SNF/Other Home  Chief Complaint Post Op - Vomitting/ No Appetite / Stomach and Back Pain   Level of Care/Admitting Diagnosis ED Disposition    ED Disposition Condition [place]: [facility] [[id number]]  Level of Care: Telemetry [5]  Admit to tele based on following criteria: Other see comments  Comments: tachycardia  Diagnosis: Dehydration [276.51.ICD-9-CM]  Admitting Physician: Toy Baker [[id number]]  Attending Physician: Toy Baker [[id number]]  Estimated length of stay: 3 - 4 days  Certification:: I certify this patient will need inpatient services for at least 2 midnights  PT Class (Do Not Modify): Inpatient [101]  PT Acc Code (Do Not Modify): Private [1]       Medical History Past Medical History:  Diagnosis Date  . Chronic kidney disease   . Diabetes mellitus    Pt reports diagnosis in June 2011  . Hyperlipidemia   . Hypertension   . Kidney transplant recipient   . LEARNING DISABILITY 09/25/2007   Qualifier:  Diagnosis of  By: Deborra Medina MD, Tanja Port    . 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 Pyelonephritis 06/23/2014  . UTI (urinary tract infection) 01/09/2015  . 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 Benadryl [Diphenhydramine-Zinc Acetate] Shortness Of Breath  . Motrin [Ibuprofen] Shortness Of Breath and Itching    Per pt  . Banana Other (See Comments)    Sick on the stomach  . Diphenhydramine Hcl     REACTION: Stopped breathing in ICU  . Doxycycline     Shortness of Breath   . Iron Dextran     REACTION: vein irritation  . Milk-Related Compounds     intolerant  . Shellfish Allergy Hives    IV Location/Drains/Wounds Patient Lines/Drains/Airways Status   Active Line/Drains/Airways    Name:   Placement date:   Placement time:   Site:   Days:   Peripheral IV 07/02/17 Right;Proximal Forearm   07/02/17    1703    Forearm   1   Incision (Closed) 06/30/17 Abdomen Other (Comment)   06/30/17    1315     3   Incision - 3 Ports Abdomen Umbilicus Upper;Left Right;Lateral   06/30/17    1145     3          Labs/Imaging Results for orders placed or performed during  the hospital encounter of 07/02/17 (from the past 48 hour(s))  Comprehensive metabolic panel     Status: Abnormal   Collection Time: 07/02/17  5:22 PM  Result Value Ref Range   Sodium 140 135 - 145 mmol/L   Potassium 3.2 (L) 3.5 - 5.1 mmol/L   Chloride 94 (L) 101 - 111 mmol/L   CO2 30 22 - 32 mmol/L   Glucose, Bld 227 (H) 65 - 99 mg/dL   BUN 28 (H) 6 - 20 mg/dL   Creatinine, Ser 1.67 (H) 0.44 - 1.00 mg/dL   Calcium 7.5 (L) 8.9 - 10.3 mg/dL   Total Protein 8.1 6.5 - 8.1 g/dL   Albumin 3.9 3.5 - 5.0 g/dL   AST 23 15 - 41 U/L   ALT 31 14 - 54 U/L   Alkaline Phosphatase 79 38 - 126 U/L   Total Bilirubin 1.1 0.3 - 1.2 mg/dL   GFR calc non Af Amer 39 (L) >60 mL/min   GFR calc Af Amer 46 (L) >60 mL/min    Comment: (NOTE) The eGFR has been calculated using the  CKD EPI equation. This calculation has not been validated in all clinical situations. eGFR's persistently <60 mL/min signify possible Chronic Kidney Disease.    Anion gap 16 (H) 5 - 15    Comment: Performed at [facility], [place] [address], [place], [place] [address]  Lipase, blood     Status: None   Collection Time: 07/02/17  5:22 PM  Result Value Ref Range   Lipase 31 11 - 51 U/L    Comment: Performed at [facility], [place] [address] [place], [place] [address]  CBC     Status: Abnormal   Collection Time: 07/02/17  5:22 PM  Result Value Ref Range   WBC 14.4 (H) 4.0 - 10.5 K/uL   RBC 4.43 3.87 - 5.11 MIL/uL   Hemoglobin 12.4 12.0 - 15.0 g/dL   HCT 39.3 36.0 - 46.0 %   MCV 88.7 78.0 - 100.0 fL   MCH 28.0 26.0 - 34.0 pg   MCHC 31.6 30.0 - 36.0 g/dL   RDW 15.1 11.5 - 15.5 %   Platelets 285 150 - 400 K/uL    Comment: Performed at [facility], [place] [address] [place], [place] [address]  Magnesium     Status: Abnormal   Collection Time: 07/02/17  5:22 PM  Result Value Ref Range   Magnesium 1.3 (L) 1.7 - 2.4 mg/dL    Comment: Performed at [facility], [place] [address], [place], [place] [address]  Phosphorus     Status: None   Collection Time: 07/02/17  5:22 PM  Result Value Ref Range   Phosphorus 4.4 2.5 - 4.6 mg/dL    Comment: Performed at [facility], [place] [address] [place], [place] [address]  POC CBG, ED     Status: Abnormal   Collection Time: 07/02/17  5:27 PM  Result Value Ref Range   Glucose-Capillary 206 (H) 65 - 99 mg/dL   Ct Abdomen Pelvis Wo Contrast  Result Date: 07/02/2017 CLINICAL DATA:  Cholecystectomy 06/30/2017. Possible postoperative complication suspected. Renal transplant 2007. EXAM: CT ABDOMEN AND PELVIS WITHOUT CONTRAST TECHNIQUE: Multidetector CT imaging of the abdomen and pelvis was performed following the standard protocol without IV contrast. COMPARISON:  CT of  the abdomen and pelvis 12/10/2016 FINDINGS: Lower chest: There is subsegmental atelectasis at both lung bases. Trace pericardial effusion. Hepatobiliary: The liver is homogeneous without  focal abnormality. Surgical clips are seen in the gallbladder fossa region. There is a small fluid collection in the gallbladder fossa, containing air and gas. This is crescentic, measuring 5.7 x 1.4 by 3.2 centimeters. Pancreas: Unremarkable. No pancreatic ductal dilatation or surrounding inflammatory changes. Spleen: Normal in size without focal abnormality. Adrenals/Urinary Tract: Adrenal glands are normal. LEFT kidney is atrophic. RIGHT native kidney is not identified. RIGHT iliac fossa renal transplant appears stable compared to prior study. Transplant kidney appears enlarged and contains focal low-attenuation areas within the MEDIAL aspect and subcapsular midpole region, all stable findings. There is stable mild prominence of the renal pelvis and stranding in the region of the hilum. Normal appearance of the urinary bladder. Stomach/Bowel: Small hiatal hernia. Stomach is otherwise normal. Small bowel loops are normal in appearance. The appendix is well seen and has a normal appearance. Loops of colon are normal in appearance. Vascular/Lymphatic: No significant vascular findings are present. No enlarged abdominal or pelvic lymph nodes. Reproductive: Hysterectomy.  No adnexal mass. Other: No abdominal wall hernia or abnormality. No abdominopelvic ascites. Postoperative changes are identified in the anterior abdominal wall. Subcutaneous and small amount of intraperitoneal gas remain following recent surgery. Musculoskeletal: No acute or significant osseous findings. IMPRESSION: 1. Crescentic fluid and gas attenuation in the region of the gallbladder fossa, measuring 5.7 x 1.4 x 3.2 centimeters. 2. Stable appearance of RIGHT iliac fossa renal transplant. 3. Small hiatal hernia. 4. Normal appendix. 5. Hysterectomy. 6. Postoperative  changes in the anterior abdominal wall. Electronically Signed   By: Nolon Nations M.D.   On: 07/02/2017 19:12   Dg Chest 2 View  Result Date: 07/02/2017 CLINICAL DATA:  Postop cholecystectomy.  Cough. EXAM: CHEST - 2 VIEW COMPARISON:  01/13/2017 FINDINGS: Mild cardiomegaly. Low lung volumes. No confluent opacities or effusions. No acute bony abnormality. IMPRESSION: Mild cardiomegaly.  No active disease. Electronically Signed   By: Rolm Baptise M.D.   On: 07/02/2017 18:13    Pending Labs Unresulted Labs (From admission, onward)   Start     Ordered   07/03/17 0500  Hemoglobin A1c  Tomorrow morning,   R    Comments:  To assess prior glycemic control    07/03/17 0058   07/03/17 0500  Magnesium  Tomorrow morning,   R    Comments:  Call MD if <1.5    07/03/17 0058   07/03/17 0500  Phosphorus  Tomorrow morning,   R     07/03/17 0058   07/03/17 0500  TSH  Once,   R    Comments:  Cancel if already done within 1 month and notify MD    07/03/17 0058   07/03/17 0500  Comprehensive metabolic panel  Once,   R    Comments:  Cal MD for K<3.5 or >5.0    07/03/17 0058   07/03/17 0500  CBC  Once,   R    Comments:  Call for hg <8.0    07/03/17 0058   07/02/17 1804  Differential  Add-on,   R     07/02/17 1803   07/02/17 1648  Urinalysis, Routine w reflex microscopic  Once,   R     07/02/17 1647      Vitals/Pain Today's Vitals   07/02/17 2330 07/03/17 0000 07/03/17 0009 07/03/17 0100  BP: 111/80 105/78  123/88  Pulse: (!) 109 (!) 107  (!) 110  Resp: 19 15  (!) 22  Temp:      TempSrc:  SpO2: 100% 100%  95%  Weight:      Height:      PainSc:   7      Isolation Precautions No active isolations  Medications Medications  acetaminophen (TYLENOL) tablet 650 mg (has no administration in time range)    Or  acetaminophen (TYLENOL) suppository 650 mg (has no administration in time range)  ondansetron (ZOFRAN) tablet 4 mg (has no administration in time range)    Or  ondansetron  (ZOFRAN) injection 4 mg (has no administration in time range)  insulin glargine (LANTUS) injection 40 Units (has no administration in time range)  insulin aspart (novoLOG) injection 0-9 Units (has no administration in time range)  0.9 %  sodium chloride infusion ( Intravenous New Bag/Given 07/03/17 0100)  potassium chloride 10 mEq in 100 mL IVPB (10 mEq Intravenous New Bag/Given 07/02/17 0103)  hydrocortisone sodium succinate (SOLU-CORTEF) 100 MG injection 25 mg (25 mg Intravenous Given 07/03/17 0001)  tacrolimus (PROGRAF) capsule 2 mg (2 mg Oral Given 07/03/17 0007)  HYDROmorphone (DILAUDID) injection 0.5 mg (0.5 mg Intravenous Given 07/03/17 0123)  famotidine (PEPCID) IVPB 20 mg premix (20 mg Intravenous Not Given 07/03/17 0123)  ondansetron (ZOFRAN) injection 4 mg (4 mg Intravenous Given 07/02/17 1715)  HYDROmorphone (DILAUDID) injection 1 mg (1 mg Intravenous Given 07/02/17 1714)  sodium chloride 0.9 % bolus 1,000 mL (0 mLs Intravenous Stopped 07/02/17 1959)  HYDROmorphone (DILAUDID) injection 1 mg (1 mg Intravenous Given 07/02/17 1813)  famotidine (PEPCID) IVPB 20 mg premix (0 mg Intravenous Stopped 07/02/17 1843)  sodium chloride 0.9 % bolus 1,000 mL (0 mLs Intravenous Stopped 07/02/17 2137)  HYDROmorphone (DILAUDID) injection 1 mg (1 mg Intravenous Given 07/02/17 1959)  promethazine (PHENERGAN) injection 25 mg (25 mg Intravenous Given 07/02/17 2133)  magnesium sulfate IVPB 2 g 50 mL (0 g Intravenous Stopped 07/03/17 0101)    Mobility walks

## 2017-07-03 NOTE — Progress Notes (Signed)
CRITICAL VALUE ALERT  Critical Value:  Calcium 6.1  Date & Time Notied:  07/03/17 0545  Provider Notified: Silas [place] [phone]  Orders Received/Actions taken:

## 2017-07-03 NOTE — Consult Note (Signed)
Reason for Consult:seemingly self-limited hematemesis Referring Physician: Hospital team  [name] is an 32 y.o. female.  HPI: patient seen and examined and her case discussed with her brother as well and her hospital computer chart reviewed and her case discussed with the ER physician and the surgical team and she wants to eat and is feeling better and no she has a hiatal hernia but it doesn't bother her very much and she been on Prilosec and Zantac in the past but not in a while and her bowel movements are minimally dark and she does not take any aspirin nonsteroidals or blood thinne and her family history is negative for any GI problems and her recent hospital stay and surgery was reviewed  Past Medical History:  Diagnosis Date  . Chronic kidney disease   . Diabetes mellitus    Pt reports diagnosis in June 2011  . Hyperlipidemia   . Hypertension   . Kidney transplant recipient   . 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 Pyelonephritis 06/23/2014  . UTI (urinary tract infection) 01/09/2015  . 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 CHOLECYSTECTOMY N/A 06/30/2017   Procedure: LAPAROSCOPIC CHOLECYSTECTOMY WITH INTRAOPERATIVE CHOLANGIOGRAM;  Surgeon: Excell Seltzer, MD;  Location: [facility];  Service: General;  Laterality: N/A;  . KIDNEY TRANSPLANT  2007  . RENAL BIOPSY Bilateral 2003  . THYROID LOBECTOMY      Family History  Problem Relation Age of Onset  . Arthritis Mother   . Diabetes Mother   . Hypertension Mother     Social History:  reports that she has never smoked. She has never used smokeless tobacco. She reports that she does not drink alcohol or use drugs.  Allergies:  Allergies  Allergen Reactions  . Benadryl [Diphenhydramine-Zinc Acetate] Shortness Of Breath  . Motrin [Ibuprofen] Shortness Of Breath  and Itching    Per pt  . Banana Other (See Comments)    Sick on the stomach  . Diphenhydramine Hcl     REACTION: Stopped breathing in ICU  . Doxycycline     Shortness of Breath   . Iron Dextran     REACTION: vein irritation  . Milk-Related Compounds     intolerant  . Shellfish Allergy Hives    Medications: I have reviewed the patient's current medications.  Results for orders placed or performed during the hospital encounter of 07/02/17 (from the past 48 hour(s))  Comprehensive metabolic panel     Status: Abnormal   Collection Time: 07/02/17  5:22 PM  Result Value Ref Range   Sodium 140 135 - 145 mmol/L   Potassium 3.2 (L) 3.5 - 5.1 mmol/L   Chloride 94 (L) 101 - 111 mmol/L   CO2 30 22 - 32 mmol/L   Glucose, Bld 227 (H) 65 - 99 mg/dL   BUN 28 (H) 6 - 20 mg/dL   Creatinine, Ser 1.67 (H) 0.44 - 1.00 mg/dL   Calcium 7.5 (L) 8.9 - 10.3 mg/dL   Total Protein 8.1 6.5 - 8.1 g/dL   Albumin 3.9 3.5 - 5.0 g/dL   AST 23 15 - 41 U/L   ALT 31 14 - 54 U/L   Alkaline Phosphatase 79 38 - 126 U/L   Total Bilirubin 1.1 0.3 - 1.2 mg/dL   GFR calc non Af  Amer 39 (L) >60 mL/min   GFR calc Af Amer 46 (L) >60 mL/min    Comment: (NOTE) The eGFR has been calculated using the CKD EPI equation. This calculation has not been validated in all clinical situations. eGFR's persistently <60 mL/min signify possible Chronic Kidney Disease.    Anion gap 16 (H) 5 - 15    Comment: Performed at [facility], [place] [address], [place], [place] [address]  Lipase, blood     Status: None   Collection Time: 07/02/17  5:22 PM  Result Value Ref Range   Lipase 31 11 - 51 U/L    Comment: Performed at [facility], [place] [address] [place], [place] [address]  CBC     Status: Abnormal   Collection Time: 07/02/17  5:22 PM  Result Value Ref Range   WBC 14.4 (H) 4.0 - 10.5 K/uL   RBC 4.43 3.87 - 5.11 MIL/uL   Hemoglobin 12.4 12.0 - 15.0 g/dL   HCT 39.3 36.0 - 46.0 %   MCV 88.7  78.0 - 100.0 fL   MCH 28.0 26.0 - 34.0 pg   MCHC 31.6 30.0 - 36.0 g/dL   RDW 15.1 11.5 - 15.5 %   Platelets 285 150 - 400 K/uL    Comment: Performed at [facility], [place] [address] [place], [place] [address]  Magnesium     Status: Abnormal   Collection Time: 07/02/17  5:22 PM  Result Value Ref Range   Magnesium 1.3 (L) 1.7 - 2.4 mg/dL    Comment: Performed at [facility], [place] [address], [place], [place] [address]  Phosphorus     Status: None   Collection Time: 07/02/17  5:22 PM  Result Value Ref Range   Phosphorus 4.4 2.5 - 4.6 mg/dL    Comment: Performed at [facility], [place] [address], [place], [place] [address]  POC CBG, ED     Status: Abnormal   Collection Time: 07/02/17  5:27 PM  Result Value Ref Range   Glucose-Capillary 206 (H) 65 - 99 mg/dL  CBG monitoring, ED     Status: Abnormal   Collection Time: 07/03/17  1:43 AM  Result Value Ref Range   Glucose-Capillary 134 (H) 65 - 99 mg/dL  Glucose, capillary     Status: Abnormal   Collection Time: 07/03/17  4:54 AM  Result Value Ref Range   Glucose-Capillary 194 (H) 65 - 99 mg/dL  Magnesium     Status: None   Collection Time: 07/03/17  4:58 AM  Result Value Ref Range   Magnesium 2.2 1.7 - 2.4 mg/dL    Comment: Performed at [facility], [place] [address] [place],  [address]  Phosphorus     Status: None   Collection Time: 07/03/17  4:58 AM  Result Value Ref Range   Phosphorus 3.9 2.5 - 4.6 mg/dL    Comment: Performed at [facility], [place] [address], [place],  [address]  TSH     Status: None   Collection Time: 07/03/17  4:58 AM  Result Value Ref Range   TSH 0.477 0.350 - 4.500 uIU/mL    Comment: Performed by a 3rd Generation assay with a functional sensitivity of <=0.01 uIU/mL. Performed at [facility], [place] [address], [name],  [address]   Comprehensive metabolic panel     Status:  Abnormal   Collection Time: 07/03/17  4:58 AM  Result Value Ref Range  Sodium 142 135 - 145 mmol/L   Potassium 3.9 3.5 - 5.1 mmol/L    Comment: DELTA CHECK NOTED   Chloride 104 101 - 111 mmol/L   CO2 28 22 - 32 mmol/L   Glucose, Bld 178 (H) 65 - 99 mg/dL   BUN 29 (H) 6 - 20 mg/dL   Creatinine, Ser 1.69 (H) 0.44 - 1.00 mg/dL   Calcium 6.1 (LL) 8.9 - 10.3 mg/dL    Comment: CRITICAL RESULT CALLED TO, READ BACK BY AND VERIFIED WITH: G SMITH AT 0544 ON 04.21.2019 BY NBROOKS    Total Protein 6.0 (L) 6.5 - 8.1 g/dL   Albumin 2.9 (L) 3.5 - 5.0 g/dL   AST 17 15 - 41 U/L   ALT 22 14 - 54 U/L   Alkaline Phosphatase 56 38 - 126 U/L   Total Bilirubin 0.5 0.3 - 1.2 mg/dL   GFR calc non Af Amer 39 (L) >60 mL/min   GFR calc Af Amer 45 (L) >60 mL/min    Comment: (NOTE) The eGFR has been calculated using the CKD EPI equation. This calculation has not been validated in all clinical situations. eGFR's persistently <60 mL/min signify possible Chronic Kidney Disease.    Anion gap 10 5 - 15    Comment: Performed at [facility], [place] [address] [place], [place] [address]  CBC     Status: Abnormal   Collection Time: 07/03/17  4:58 AM  Result Value Ref Range   WBC 11.9 (H) 4.0 - 10.5 K/uL   RBC 3.19 (L) 3.87 - 5.11 MIL/uL   Hemoglobin 9.1 (L) 12.0 - 15.0 g/dL    Comment: REPEATED TO VERIFY DELTA CHECK NOTED    HCT 29.1 (L) 36.0 - 46.0 %   MCV 91.2 78.0 - 100.0 fL   MCH 28.5 26.0 - 34.0 pg   MCHC 31.3 30.0 - 36.0 g/dL   RDW 15.5 11.5 - 15.5 %   Platelets 228 150 - 400 K/uL    Comment: Performed at [facility], [place] [address], [place], [place] [address]  Differential     Status: Abnormal   Collection Time: 07/03/17  4:58 AM  Result Value Ref Range   Neutrophils Relative % 76 %   Neutro Abs 9.1 (H) 1.7 - 7.7 K/uL   Lymphocytes Relative 14 %   Lymphs Abs 1.6 0.7 - 4.0 K/uL   Monocytes Relative 8 %   Monocytes Absolute 0.9 0.1 - 1.0 K/uL   Eosinophils  Relative 2 %   Eosinophils Absolute 0.2 0.0 - 0.7 K/uL   Basophils Relative 0 %   Basophils Absolute 0.0 0.0 - 0.1 K/uL    Comment: Performed at [facility], [place] [address] [place], [place] [address]  Glucose, capillary     Status: Abnormal   Collection Time: 07/03/17  7:26 AM  Result Value Ref Range   Glucose-Capillary 182 (H) 65 - 99 mg/dL    Ct Abdomen Pelvis Wo Contrast  Result Date: 07/02/2017 CLINICAL DATA:  Cholecystectomy 06/30/2017. Possible postoperative complication suspected. Renal transplant 2007. EXAM: CT ABDOMEN AND PELVIS WITHOUT CONTRAST TECHNIQUE: Multidetector CT imaging of the abdomen and pelvis was performed following the standard protocol without IV contrast. COMPARISON:  CT of the abdomen and pelvis 12/10/2016 FINDINGS: Lower chest: There is subsegmental atelectasis at both lung bases. Trace pericardial effusion. Hepatobiliary: The liver is homogeneous without focal abnormality. Surgical clips are seen in the gallbladder fossa region. There is a small fluid collection in  the gallbladder fossa, containing air and gas. This is crescentic, measuring 5.7 x 1.4 by 3.2 centimeters. Pancreas: Unremarkable. No pancreatic ductal dilatation or surrounding inflammatory changes. Spleen: Normal in size without focal abnormality. Adrenals/Urinary Tract: Adrenal glands are normal. LEFT kidney is atrophic. RIGHT native kidney is not identified. RIGHT iliac fossa renal transplant appears stable compared to prior study. Transplant kidney appears enlarged and contains focal low-attenuation areas within the MEDIAL aspect and subcapsular midpole region, all stable findings. There is stable mild prominence of the renal pelvis and stranding in the region of the hilum. Normal appearance of the urinary bladder. Stomach/Bowel: Small hiatal hernia. Stomach is otherwise normal. Small bowel loops are normal in appearance. The appendix is well seen and has a normal appearance. Loops of  colon are normal in appearance. Vascular/Lymphatic: No significant vascular findings are present. No enlarged abdominal or pelvic lymph nodes. Reproductive: Hysterectomy.  No adnexal mass. Other: No abdominal wall hernia or abnormality. No abdominopelvic ascites. Postoperative changes are identified in the anterior abdominal wall. Subcutaneous and small amount of intraperitoneal gas remain following recent surgery. Musculoskeletal: No acute or significant osseous findings. IMPRESSION: 1. Crescentic fluid and gas attenuation in the region of the gallbladder fossa, measuring 5.7 x 1.4 x 3.2 centimeters. 2. Stable appearance of RIGHT iliac fossa renal transplant. 3. Small hiatal hernia. 4. Normal appendix. 5. Hysterectomy. 6. Postoperative changes in the anterior abdominal wall. Electronically Signed   By: Nolon Nations M.D.   On: 07/02/2017 19:12   Dg Chest 2 View  Result Date: 07/02/2017 CLINICAL DATA:  Postop cholecystectomy.  Cough. EXAM: CHEST - 2 VIEW COMPARISON:  01/13/2017 FINDINGS: Mild cardiomegaly. Low lung volumes. No confluent opacities or effusions. No acute bony abnormality. IMPRESSION: Mild cardiomegaly.  No active disease. Electronically Signed   By: Rolm Baptise M.D.   On: 07/02/2017 18:13    ROSnegative except above only hurts by the surgical incision Blood pressure 104/60, pulse (!) 102, temperature 98.3 F (36.8 C), temperature source Oral, resp. rate 18, height '5\' 6"'  (1.676 m), weight 78.9 kg (174 lb), SpO2 94 %. Physical Examvital signs stable afebrile no acute distress abdomen is still little sore mostly on the surgical sites heart and lungs are fineBUN and creatinine are unchanged hemoglobin slightly lower than 2 days ago  Assessment/Plan: Seemingly self-limited hematemesis in a patient with recent surgery and known hiatal hernia Plan: The risks benefits methods of endoscopy was discussed with the patient and her brother and will proceed tomorrow morning and can probably go  home tomorrow if no significant findings and no signs of further bleeding or other problems and we will allow soft diet today and the endoscopy will assist with medical management of her hiatal hernia and other lesions and please call me sooner if questions or problems  Deziyah Arvin E 07/03/2017, 9:12 AM

## 2017-07-03 NOTE — Plan of Care (Signed)
VSS, patient without any nausea or vomiting during 7 a to 7 pshift, does c/o abdominal pain improved with po medication.  Patient tolerating soft diet without problem.  Patient signed consent for EGD and verbalizes understanding that she is nothing by mouth after midnight.

## 2017-07-03 NOTE — Progress Notes (Signed)
Inpatient Diabetes Program Recommendations  AACE/ADA: New Consensus Statement on Inpatient Glycemic Control (2015)  Target Ranges:  Prepandial:   less than 140 mg/dL      Peak postprandial:   less than 180 mg/dL (1-2 hours)      Critically ill patients:  140 - 180 mg/dL   Results for ALAYLA, DETHLEFS (MRN [id number]) as of 07/03/2017 11:00  Ref. Range 07/02/2017 17:27 07/03/2017 01:43 07/03/2017 04:54 07/03/2017 07:26  Glucose-Capillary Latest Ref Range: 65 - 99 mg/dL 206 (H) 134 (H) 194 (H) 182 (H)   Review of Glycemic Control  Diabetes history: DM2 Outpatient Diabetes medications: Tresiba 130 units daily, Novolog 15 units BID plus additional units for correction Current orders for Inpatient glycemic control: Lantus 40 units QHS, Novolog 0-9 units Q4H; Solucortef 25 mg Q8H  Inpatient Diabetes Program Recommendations:  Insulin - Meal Coverage: Please consider ordering Novolog 4 units TID with meals for meal coverage if patient eats at least 50% of meals. HgbA1C: A1C 8.1% on 07/03/17 indicating an average glucose of 186 mg/dl over the past 2-3 months.  NOTE: Noted consult for Diabetes Coordinator for diabetes management. Diabetes Coordinator is not on campus over the weekend but available for questions or concerns by pager from 8am to 5pm. Chart reviewed.   Thanks, Barnie Alderman, RN, MSN, CDE Diabetes Coordinator Inpatient Diabetes Program [phone] (Team Pager from 8am to 5pm)

## 2017-07-04 ENCOUNTER — Inpatient Hospital Stay (HOSPITAL_COMMUNITY): Payer: Medicare Other | Admitting: Anesthesiology

## 2017-07-04 ENCOUNTER — Encounter (HOSPITAL_COMMUNITY)
Admission: EM | Disposition: A | Discharge status: Home / Self Care | Payer: Self-pay | Source: Home / Self Care | Attending: Family Medicine

## 2017-07-04 ENCOUNTER — Encounter (HOSPITAL_COMMUNITY): Payer: Self-pay

## 2017-07-04 HISTORY — PX: ESOPHAGOGASTRODUODENOSCOPY (EGD) WITH PROPOFOL: SHX5813

## 2017-07-04 LAB — CBC
HEMATOCRIT: 28 % — AB (ref 36.0–46.0)
HEMOGLOBIN: 8.5 g/dL — AB (ref 12.0–15.0)
MCH: 27.6 pg (ref 26.0–34.0)
MCHC: 30.4 g/dL (ref 30.0–36.0)
MCV: 90.9 fL (ref 78.0–100.0)
PLATELETS: 200 10*3/uL (ref 150–400)
RBC: 3.08 MIL/uL — AB (ref 3.87–5.11)
RDW: 15.1 % (ref 11.5–15.5)
WBC: 7.4 10*3/uL (ref 4.0–10.5)

## 2017-07-04 LAB — HEPATIC FUNCTION PANEL
ALT: 17 U/L (ref 14–54)
AST: 14 U/L — AB (ref 15–41)
Albumin: 2.7 g/dL — ABNORMAL LOW (ref 3.5–5.0)
Alkaline Phosphatase: 50 U/L (ref 38–126)
Bilirubin, Direct: <0.1 mg/dL — ABNORMAL LOW (ref 0.1–0.5)
TOTAL PROTEIN: 5.8 g/dL — AB (ref 6.5–8.1)
Total Bilirubin: 0.4 mg/dL (ref 0.3–1.2)

## 2017-07-04 LAB — BASIC METABOLIC PANEL
ANION GAP: 10 (ref 5–15)
BUN: 25 mg/dL — ABNORMAL HIGH (ref 6–20)
CALCIUM: 6.4 mg/dL — AB (ref 8.9–10.3)
CHLORIDE: 102 mmol/L (ref 101–111)
CO2: 28 mmol/L (ref 22–32)
Creatinine, Ser: 1.92 mg/dL — ABNORMAL HIGH (ref 0.44–1.00)
GFR calc Af Amer: 39 mL/min — ABNORMAL LOW (ref >60)
GFR calc non Af Amer: 33 mL/min — ABNORMAL LOW (ref >60)
GLUCOSE: 172 mg/dL — AB (ref 65–99)
Potassium: 3.9 mmol/L (ref 3.5–5.1)
Sodium: 140 mmol/L (ref 135–145)

## 2017-07-04 LAB — URINALYSIS, ROUTINE W REFLEX MICROSCOPIC
BACTERIA UA: NONE SEEN
Bilirubin Urine: NEGATIVE
GLUCOSE, UA: NEGATIVE mg/dL
Ketones, ur: NEGATIVE mg/dL
Leukocytes, UA: NEGATIVE
NITRITE: NEGATIVE
PROTEIN: 100 mg/dL — AB
RBC / HPF: NONE SEEN RBC/hpf (ref 0–5)
Specific Gravity, Urine: 1.008 (ref 1.005–1.030)
pH: 5 (ref 5.0–8.0)

## 2017-07-04 LAB — HEMOGLOBIN AND HEMATOCRIT, BLOOD
HEMATOCRIT: 32 % — AB (ref 36.0–46.0)
Hemoglobin: 9.8 g/dL — ABNORMAL LOW (ref 12.0–15.0)

## 2017-07-04 LAB — GLUCOSE, CAPILLARY
GLUCOSE-CAPILLARY: 175 mg/dL — AB (ref 65–99)
GLUCOSE-CAPILLARY: 247 mg/dL — AB (ref 65–99)
Glucose-Capillary: 172 mg/dL — ABNORMAL HIGH (ref 65–99)
Glucose-Capillary: 204 mg/dL — ABNORMAL HIGH (ref 65–99)
Glucose-Capillary: 148 mg/dL — ABNORMAL HIGH (ref 65–99)
Glucose-Capillary: 279 mg/dL — ABNORMAL HIGH (ref 65–99)

## 2017-07-04 LAB — MAGNESIUM: Magnesium: 1.7 mg/dL (ref 1.7–2.4)

## 2017-07-04 SURGERY — ESOPHAGOGASTRODUODENOSCOPY (EGD) WITH PROPOFOL
Anesthesia: Monitor Anesthesia Care

## 2017-07-04 MED ORDER — ONDANSETRON HCL 4 MG/2ML IJ SOLN
INTRAMUSCULAR | Status: DC | PRN
  Administered 2017-07-04: 4 mg via INTRAVENOUS

## 2017-07-04 MED ORDER — SCOPOLAMINE 1 MG/3DAYS TD PT72
MEDICATED_PATCH | TRANSDERMAL | Status: DC | PRN
  Administered 2017-07-04: 1 via TRANSDERMAL

## 2017-07-04 MED ORDER — SCOPOLAMINE 1 MG/3DAYS TD PT72
MEDICATED_PATCH | TRANSDERMAL | Status: AC
  Filled 2017-07-04: qty 1

## 2017-07-04 MED ORDER — LACTATED RINGERS IV SOLN
INTRAVENOUS | Status: DC | PRN
  Administered 2017-07-04: 12:00:00 via INTRAVENOUS

## 2017-07-04 MED ORDER — PROPOFOL 500 MG/50ML IV EMUL
INTRAVENOUS | Status: DC | PRN
  Administered 2017-07-04: 300 ug/kg/min via INTRAVENOUS

## 2017-07-04 MED ORDER — GLYCOPYRROLATE 0.2 MG/ML IJ SOLN
INTRAMUSCULAR | Status: DC | PRN
  Administered 2017-07-04: 0.1 mg via INTRAVENOUS

## 2017-07-04 MED ORDER — HYDRALAZINE HCL 20 MG/ML IJ SOLN
5.0000 mg | INTRAMUSCULAR | Status: DC | PRN
  Administered 2017-07-04: 5 mg via INTRAVENOUS
  Filled 2017-07-04: qty 1

## 2017-07-04 MED ORDER — LIDOCAINE HCL (CARDIAC) PF 100 MG/5ML IV SOSY
PREFILLED_SYRINGE | INTRAVENOUS | Status: DC | PRN
  Administered 2017-07-04: 75 mg via INTRAVENOUS

## 2017-07-04 MED ORDER — PROPOFOL 10 MG/ML IV BOLUS
INTRAVENOUS | Status: AC
Start: 2017-07-04 — End: ?
  Filled 2017-07-04: qty 60

## 2017-07-04 SURGICAL SUPPLY — 14 items

## 2017-07-04 NOTE — Progress Notes (Signed)
CRITICAL VALUE ALERT  Critical Value: Calcium 6.4  Date & Time Notied:  4/22 06:19  Provider Notified: [place]heimer  Orders Received/Actions taken: Notified via text page, no new orders received

## 2017-07-04 NOTE — Transfer of Care (Signed)
Immediate Anesthesia Transfer of Care Note  Patient: [name]  Procedure(s) Performed: ESOPHAGOGASTRODUODENOSCOPY (EGD) WITH PROPOFOL (N/A )  Patient Location: PACU  Anesthesia Type:MAC  Level of Consciousness: awake, alert , oriented and patient cooperative  Airway & Oxygen Therapy: Patient Spontanous Breathing and Patient connected to nasal cannula oxygen  Post-op Assessment: Report given to RN, Post -op Vital signs reviewed and stable and Patient moving all extremities X 4  Post vital signs: stable  Last Vitals:  Vitals Value Taken Time  BP 171/104 07/04/2017  2:10 PM  Temp 36.7 C 07/04/2017  2:10 PM  Pulse 82 07/04/2017  2:10 PM  Resp 16 07/04/2017  2:10 PM  SpO2 100 % 07/04/2017  2:10 PM  Vitals shown include unvalidated device data.  Last Pain:  Vitals:   07/04/17 1410  TempSrc: Oral  PainSc:       Patients Stated Pain Goal: 5 (32/[id number]/91 [id number])  Complications: No apparent anesthesia complications

## 2017-07-04 NOTE — Progress Notes (Signed)
Entered patient's room, introduced myself again, gave her an update on her procedure time and when she will be picked up, patient then asked when she could eat, I instructed that that per the note they would advance her diet after procedure, she then asked for a popcicle, I told her she could not have anything at this time, just sips of water with her meds, she started taking her meds then asked for more water and said I only gave her ice, when I returned with additional water and explained again that she needed to just drink enough to take her meds with, she got upset, asked for IV pain med, I looked at her prn list and she only had oral pain meds, I explained what she had available and offered to get them, she stated she had IV med, I reinforced that there was not an order for IV meds, offered the oral pain meds or I could page MD, she agreed to take the oral meds, I went to get them, when I returned she stated she was going to throw her meds that she had taken up, I offered to take the remaining meds and bring them back later when she was feeling better, she began saying I was rushing her and she did not like to be rushed, that she had not had any food or drink since midnight, I then stated that we could hold the rest of the meds and before I could finish she out her med cup on table and said to take them, then she stated no leave my pills and you get out of my room, you have a bad attitude, I told her I was not giving her an attitude and she said leave so I told her that I would take the meds and she could take them later, she said to leave the pills and for me to get out, I explained I could not leave them at her bedside, She said I told you to leave them and get out, I again told her I was not leaving them, that nurses are not allowed and I am not going to do so and put my license on the line and she stated that of course you won't put them on the line for a black person, ADON on unit and made aware and  assignment was changed with another RN to assume care of her

## 2017-07-04 NOTE — Progress Notes (Signed)
[place] 1:09 PM  Subjective: Patient without any GI complaints and no signs of bleeding and did have a brown bowel movement  Objective: ital signs stable afebrile no acute distress exam please see preassessment evaluation chemistries okay hemoglobin slight drop  Assessment: eemingly self-limited hematemesis in a patient with history of vital hernia  Plan: Okay to proceed with endoscopy today with anesthesia assistance  [facility]  Pager [phone] After 5PM or if no answer call [phone]

## 2017-07-04 NOTE — Anesthesia Procedure Notes (Signed)
Procedure Name: MAC Date/Time: 07/04/2017 1:42 PM Performed by: Lissa Morales, CRNA Pre-anesthesia Checklist: Patient identified, Emergency Drugs available, Suction available, Patient being monitored and Timeout performed Patient Re-evaluated:Patient Re-evaluated prior to induction Oxygen Delivery Method: Nasal cannula Placement Confirmation: positive ETCO2

## 2017-07-04 NOTE — Progress Notes (Signed)
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  Patient presented to the emergency department complaining of nausea and vomiting since she was discharged on 4/19.  Vomited was dark like coffee ground which was worrisome for blood.  Patient was unable to keep fluids or food down and came to the emergency department for further evaluation.  Other associated symptoms include abdominal pain and occasional cough.  Patient was admitted with working diagnosis of intractable nausea and vomiting complicated with hematemesis.  Subjective: Patient seen and examined, she is status post EGD.  Blood pressure elevated  Assessment & Plan: Hematemesis - resolved In setting of intractable nausea and vomiting, concern for possible Mallory-Weiss tear.  GI has been consulted and input appreciated.  Status post EGD, small hiatal hernia GERD, and large amount of food residue.  Otherwise with no abnormalities.  Per GI resume soft diet today.  Intractable nausea and vomiting - resolved CT of the abdomen showed no evidence of obstruction, suspect to be postop complication.  Unlikely to be adrenal insufficiency with lack of electrolytes abnormalities and normal vital signs.  Patient has remained on steroids.  Continue supportive treatment.  Chronic kidney disease/status post renal transplant in 2007 Case was discussed with nephrology Myfortic was held overnight now that she is tolerating diet will resume Myfortic and Prograf.  Patient was treated with IV hydrocortisone will transition to oral prednisone since she is tolerating oral diet.  Creatinine stable.  Essential hypertension Blood pressure was  stable, after endoscopy BP has been elevated up to 170s/110s, patient was not taking antihypertensive medication.  Will monitor overnight, will add hydralazine as needed.  If BP continues to be elevated may need to start antihypertensive medications.  Status post cholecystectomy 4/19 Surgical input appreciated  Type 1 diabetes mellitus uncontrolled with hyperglycemia CBGs stable, continue current regimen  Anemia of chronic disease from CKD Hemoglobin stable EPO per renal  DVT prophylaxis: SCDs Code Status: Full code Family Communication: Brother via telephone Disposition Plan: Home in a.m. if BP stable  Consultants:   GI  General surgery  Nephrology  Procedures:   None  Antimicrobials:  None   Objective: Vitals:   07/04/17 1254 07/04/17 1410 07/04/17 1419 07/04/17 1634  BP: (!) 162/115 (!) 171/104 (!) 176/117 (!) 161/116  Pulse: 82 83  91  Resp: 15 18  16   Temp: 98 F (36.7 C) 98 F (36.7 C)  98.4 F (36.9 C)  TempSrc: Oral Oral Oral Oral  SpO2: 98% 100% 95% 100%  Weight: 78.9 kg (174 lb)     Height: 5\' 6"  (1.676 m)       Intake/Output Summary (Last 24 hours) at 07/04/2017 1804 Last data filed at 07/04/2017 1640 Gross per 24 hour  Intake 960 ml  Output 1600 ml  Net -640 ml   Filed Weights   07/02/17 1637 07/03/17 0507 07/04/17 1254  Weight: 79.4 kg (175 lb) 78.9 kg (174 lb) 78.9 kg (174 lb)    Examination:  General: Pt is alert, awake, not in acute distress Cardiovascular: RRR, S1/S2 +, no rubs, no gallops Respiratory: CTA bilaterally, no wheezing, no rhonchi Abdominal: Soft, NT, ND, bowel sounds + Extremities: no edema, no cyanosis.   Data Reviewed: I  have personally reviewed following labs and imaging studies  CBC: Recent Labs  Lab 06/28/17 1419 06/29/17 1655 06/30/17 0421 07/02/17 1722 07/03/17 0458 07/04/17 0455 07/04/17 1546  WBC 11.5* 10.1 9.8 14.4* 11.9* 7.4  --   NEUTROABS 9.4*  --   --   --  9.1*  --   --   HGB 10.7* 10.7*  10.0* 12.4 9.1* 8.5* 9.8*  HCT 33.8* 34.2* 32.0* 39.3 29.1* 28.0* 32.0*  MCV 88.5 87.9 89.1 88.7 91.2 90.9  --   PLT 288 293 276 285 228 200  --    Basic Metabolic Panel: Recent Labs  Lab 06/29/17 1655 06/30/17 0421 07/02/17 1722 07/03/17 0458 07/04/17 0455  NA 137 140 140 142 140  K 3.5 3.9 3.2* 3.9 3.9  CL 103 106 94* 104 102  CO2 23 22 30 28 28   GLUCOSE 240* 98 227* 178* 172*  BUN 44* 38* 28* 29* 25*  CREATININE 2.34* 1.87* 1.67* 1.69* 1.92*  CALCIUM 7.3* 7.0* 7.5* 6.1* 6.4*  MG  --  1.5* 1.3* 2.2 1.7  PHOS  --  4.4 4.4 3.9  --    GFR: Estimated Creatinine Clearance: 44.1 mL/min (A) (by C-G formula based on SCr of 1.92 mg/dL (H)). Liver Function Tests: Recent Labs  Lab 06/29/17 1655 06/30/17 0421 07/02/17 1722 07/03/17 0458 07/04/17 0451  AST 16 16 23 17  14*  ALT 15 14 31 22 17   ALKPHOS 73 67 79 56 50  BILITOT 0.7 0.7 1.1 0.5 0.4  PROT 8.0 7.4 8.1 6.0* 5.8*  ALBUMIN 3.8 3.4* 3.9 2.9* 2.7*   Recent Labs  Lab 06/27/17 2045 06/28/17 1419 06/29/17 1655 07/02/17 1722  LIPASE 27 26 30 31    No results for input(s): AMMONIA in the last 168 hours. Coagulation Profile: No results for input(s): INR, PROTIME in the last 168 hours. Cardiac Enzymes: No results for input(s): CKTOTAL, CKMB, CKMBINDEX, TROPONINI in the last 168 hours. BNP (last 3 results) No results for input(s): PROBNP in the last 8760 hours. HbA1C: Recent Labs    07/03/17 0458  HGBA1C 8.1*   CBG: Recent Labs  Lab 07/04/17 0004 07/04/17 0443 07/04/17 0724 07/04/17 1147 07/04/17 1655  GLUCAP 204* 172* 175* 148* 247*   Lipid Profile: No results for input(s): CHOL, HDL, LDLCALC, TRIG, CHOLHDL, LDLDIRECT in the last 72 hours. Thyroid Function Tests: Recent Labs    07/03/17 0458  TSH 0.477   Anemia Panel: No results for input(s): VITAMINB12, FOLATE, FERRITIN, TIBC, IRON, RETICCTPCT in the last 72 hours. Sepsis Labs: No results for input(s): PROCALCITON, LATICACIDVEN in the last 168  hours.  Recent Results (from the past 240 hour(s))  MRSA PCR Screening     Status: None   Collection Time: 06/30/17  5:42 AM  Result Value Ref Range Status   MRSA by PCR NEGATIVE NEGATIVE Final    Comment:        The GeneXpert MRSA Assay (FDA approved for NASAL specimens only), is one component of a comprehensive MRSA colonization surveillance program. It is not intended to diagnose MRSA infection nor to guide or monitor treatment for MRSA infections. Performed at [facility], [place] [address], [place], [place] [address]       Radiology Studies: Ct Abdomen Pelvis Wo Contrast  Result Date: 07/02/2017 CLINICAL DATA:  Cholecystectomy 06/30/2017. Possible postoperative complication suspected. Renal transplant 2007. EXAM: CT ABDOMEN AND PELVIS WITHOUT CONTRAST TECHNIQUE: Multidetector CT imaging of the abdomen and pelvis was performed following the standard protocol without IV  contrast. COMPARISON:  CT of the abdomen and pelvis 12/10/2016 FINDINGS: Lower chest: There is subsegmental atelectasis at both lung bases. Trace pericardial effusion. Hepatobiliary: The liver is homogeneous without focal abnormality. Surgical clips are seen in the gallbladder fossa region. There is a small fluid collection in the gallbladder fossa, containing air and gas. This is crescentic, measuring 5.7 x 1.4 by 3.2 centimeters. Pancreas: Unremarkable. No pancreatic ductal dilatation or surrounding inflammatory changes. Spleen: Normal in size without focal abnormality. Adrenals/Urinary Tract: Adrenal glands are normal. LEFT kidney is atrophic. RIGHT native kidney is not identified. RIGHT iliac fossa renal transplant appears stable compared to prior study. Transplant kidney appears enlarged and contains focal low-attenuation areas within the MEDIAL aspect and subcapsular midpole region, all stable findings. There is stable mild prominence of the renal pelvis and stranding in the region of the hilum.  Normal appearance of the urinary bladder. Stomach/Bowel: Small hiatal hernia. Stomach is otherwise normal. Small bowel loops are normal in appearance. The appendix is well seen and has a normal appearance. Loops of colon are normal in appearance. Vascular/Lymphatic: No significant vascular findings are present. No enlarged abdominal or pelvic lymph nodes. Reproductive: Hysterectomy.  No adnexal mass. Other: No abdominal wall hernia or abnormality. No abdominopelvic ascites. Postoperative changes are identified in the anterior abdominal wall. Subcutaneous and small amount of intraperitoneal gas remain following recent surgery. Musculoskeletal: No acute or significant osseous findings. IMPRESSION: 1. Crescentic fluid and gas attenuation in the region of the gallbladder fossa, measuring 5.7 x 1.4 x 3.2 centimeters. 2. Stable appearance of RIGHT iliac fossa renal transplant. 3. Small hiatal hernia. 4. Normal appendix. 5. Hysterectomy. 6. Postoperative changes in the anterior abdominal wall. Electronically Signed   By: Nolon Nations M.D.   On: 07/02/2017 19:12   Dg Chest 2 View  Result Date: 07/02/2017 CLINICAL DATA:  Postop cholecystectomy.  Cough. EXAM: CHEST - 2 VIEW COMPARISON:  01/13/2017 FINDINGS: Mild cardiomegaly. Low lung volumes. No confluent opacities or effusions. No acute bony abnormality. IMPRESSION: Mild cardiomegaly.  No active disease. Electronically Signed   By: Rolm Baptise M.D.   On: 07/02/2017 18:13      Scheduled Meds: . famotidine  20 mg Oral BID  . insulin aspart  0-9 Units Subcutaneous Q4H  . insulin glargine  40 Units Subcutaneous QHS  . mycophenolate  720 mg Oral BID  . predniSONE  5 mg Oral Q breakfast  . simvastatin  20 mg Oral Once per day on Mon Wed Fri  . tacrolimus  2 mg Oral BID   Continuous Infusions:    LOS: 2 days    Time spent: Total of 15 minutes spent with pt, greater than 50% of which was spent in discussion of  treatment, counseling and coordination of  care  Chipper Oman, MD Pager: Text Page via [email or web address]   If 7PM-7AM, please contact night-coverage [email or web address] 07/04/2017, 6:04 PM   Note - This record has been created using [employer]. Chart creation errors have been sought, but may not always have been located. Such creation errors do not reflect on the standard of medical care.

## 2017-07-04 NOTE — Anesthesia Postprocedure Evaluation (Signed)
Anesthesia Post Note  Patient: [name]  Procedure(s) Performed: ESOPHAGOGASTRODUODENOSCOPY (EGD) WITH PROPOFOL (N/A )     Patient location during evaluation: PACU Anesthesia Type: MAC Level of consciousness: awake and alert Pain management: pain level controlled Vital Signs Assessment: post-procedure vital signs reviewed and stable Respiratory status: spontaneous breathing Cardiovascular status: stable Anesthetic complications: no    Last Vitals:  Vitals:   07/04/17 1410 07/04/17 1419  BP: (!) 171/104 (!) 176/117  Pulse: 83   Resp: 18   Temp: 36.7 C   SpO2: 100% 95%    Last Pain:  Vitals:   07/04/17 1419  TempSrc: Oral  PainSc: 0-No pain                 Nolon Nations

## 2017-07-04 NOTE — Progress Notes (Signed)
Inpatient Diabetes Program Recommendations  AACE/ADA: New Consensus Statement on Inpatient Glycemic Control (2015)  Target Ranges:  Prepandial:   less than 140 mg/dL      Peak postprandial:   less than 180 mg/dL (1-2 hours)      Critically ill patients:  140 - 180 mg/dL   Lab Results  Component Value Date   GLUCAP 175 (H) 07/04/2017   HGBA1C 8.1 (H) 07/03/2017    Review of Glycemic Control  FBS looks good. Post-prandials elevated. Needs meal coverage insulin.  Inpatient Diabetes Program Recommendations:  Add Novolog 4 units tidwc for meal coverage insulin if pt eats > 50% meal. Will discuss HgbA1C of 8.1% and importance of tight glycemic control.  Continue to follow while inpatient.  Thank you. Lorenda Peck, RD, LDN, CDE Inpatient Diabetes Coordinator [phone]

## 2017-07-04 NOTE — Plan of Care (Signed)

## 2017-07-04 NOTE — Op Note (Signed)
[facility] Patient Name: [name] Procedure Date: 07/04/2017 MRN: [id number] Attending MD: Clarene Essex , MD Date of Birth: 18-May-1985 CSN: [id number] Age: 32 Admit Type: Inpatient Procedure:                Upper GI endoscopy Indications:              Hematemesis Providers:                Clarene Essex, MD, [name], RN, Tinnie Gens,                            Technician, Enrigue Catena, CRNA Referring MD:              Medicines:                Propofol per Anesthesia propofol 220 lidocaine 75                            Robinul 0.2 Zofran 4 mg postprocedure scopolamine                            patch Complications:            No immediate complications. Estimated Blood Loss:     Estimated blood loss: none. Procedure:                Pre-Anesthesia Assessment:                           - Prior to the procedure, a History and Physical                            was performed, and patient medications and                            allergies were reviewed. The patient's tolerance of                            previous anesthesia was also reviewed. The risks                            and benefits of the procedure and the sedation                            options and risks were discussed with the patient.                            All questions were answered, and informed consent                            was obtained. Prior Anticoagulants: The patient has                            taken no previous anticoagulant or antiplatelet                            agents. ASA Grade  Assessment: II - A patient with                            mild systemic disease. After reviewing the risks                            and benefits, the patient was deemed in                            satisfactory condition to undergo the procedure.                           After obtaining informed consent, the endoscope was                            passed under direct vision. Throughout the                           procedure, the patient's blood pressure, pulse, and                            oxygen saturations were monitored continuously. The                            [id number] ([id number]) scope was introduced through the                            mouth, and advanced to the second part of duodenum.                            The upper GI endoscopy was accomplished without                            difficulty. The patient tolerated the procedure                            well. Scope In: Scope Out: Findings:      A small hiatal hernia was present.      [Grade] moderate distal to mid esophagitis with no bleeding was found.      A large amount of food (residue) was found in the gastric fundus, on the       greater curvature of the stomach and on the lesser curvature of the       stomach.      The duodenal bulb, first portion of the duodenum and second portion of       the duodenum were normal.      The exam was otherwise without abnormality. Impression:               - Small hiatal hernia.                           - [Grade] moderate distal to mid reflux esophagitis.                           - A large  amount of food (residue) in the stomach.                           - Normal duodenal bulb, first portion of the                            duodenum and second portion of the duodenum.                           - The examination was otherwise normal.                           - No specimens collected. Moderate Sedation:      N/A- Per Anesthesia Care Recommendation:           - Patient has a contact number available for                            emergencies. The signs and symptoms of potential                            delayed complications were discussed with the                            patient. Return to normal activities tomorrow.                            Written discharge instructions were provided to the                            patient.                            - Soft diet today. Hopefully can go home soon                           - Continue present medications. Would use once a                            day pump inhibitor                           - Return to GI clinic in 3 weeks. And consider                            repeat endoscopy at some point in the future to                            document healing and consider a gastric emptying                            study at some point if upper tract symptoms                            continue or get  worse                           - Telephone GI clinic if symptomatic PRN. Procedure Code(s):        --- Professional ---                           [phone], Esophagogastroduodenoscopy, flexible,                            transoral; diagnostic, including collection of                            specimen(s) by brushing or washing, when performed                            (separate procedure) Diagnosis Code(s):        --- Professional ---                           K44.9, Diaphragmatic hernia without obstruction or                            gangrene                           K21.0, Gastro-esophageal reflux disease with                            esophagitis                           K92.0, Hematemesis CPT copyright 2017 [employer]. All rights reserved. The codes documented in this report are preliminary and upon coder review may  be revised to meet current compliance requirements. Clarene Essex, MD 07/04/2017 2:11:27 PM This report has been signed electronically. Number of Addenda: 0

## 2017-07-04 NOTE — Progress Notes (Signed)
Taking over care of patient. Agree with previous RN assessment. Will continue to monitor. 

## 2017-07-04 NOTE — Progress Notes (Addendum)
[facility] Progress Note  Day of Surgery  Subjective: CC- abdominal pain Going for upper endoscopy later this morning. States that she is hungry.she was tolerating diet yesterday.  She continues to have some upper abdominal pain, it is somewhat improved since admission. She has had no n/v in the last 24 hours. She is passing flatus and had multiple BMs yesterday. States that this was her first good BM since surgery.  Objective: Vital signs in last 24 hours: Temp:  [97.7 F (36.5 C)-99 F (37.2 C)] 97.7 F (36.5 C) (04/22 0441) Pulse Rate:  [73-96] 73 (04/22 0441) Resp:  [12-18] 18 (04/22 0441) BP: (96-137)/(72-93) 130/82 (04/22 0441) SpO2:  [91 %-98 %] 97 % (04/22 0441) Last BM Date: 07/03/17  Intake/Output from previous day: 04/21 0701 - 04/22 0700 In: 1560 [P.O.:1560] Out: 1000 [Urine:1000] Intake/Output this shift: No intake/output data recorded.  PE: Gen:  Alert, NAD, pleasant HEENT: EOM's intact, pupils equal and round Card:  RRR, no M/G/R heard Pulm:  CTAB, no W/R/R, effort normal Abd: Soft, ND, mild RUQ and epigastric tenderness, +BS, lap incisions cdi  Lab Results:  Recent Labs    07/03/17 0458 07/04/17 0455  WBC 11.9* 7.4  HGB 9.1* 8.5*  HCT 29.1* 28.0*  PLT 228 200   BMET Recent Labs    07/03/17 0458 07/04/17 0455  NA 142 140  K 3.9 3.9  CL 104 102  CO2 28 28  GLUCOSE 178* 172*  BUN 29* 25*  CREATININE 1.69* 1.92*  CALCIUM 6.1* 6.4*   PT/INR No results for input(s): LABPROT, INR in the last 72 hours. CMP     Component Value Date/Time   NA 140 07/04/2017 0455   K 3.9 07/04/2017 0455   CL 102 07/04/2017 0455   CO2 28 07/04/2017 0455   GLUCOSE 172 (H) 07/04/2017 0455   BUN 25 (H) 07/04/2017 0455   CREATININE 1.92 (H) 07/04/2017 0455   CALCIUM 6.4 (LL) 07/04/2017 0455   PROT 6.0 (L) 07/03/2017 0458   ALBUMIN 2.9 (L) 07/03/2017 0458   AST 17 07/03/2017 0458   ALT 22 07/03/2017 0458   ALKPHOS 56 07/03/2017 0458   BILITOT  0.5 07/03/2017 0458   GFRNONAA 33 (L) 07/04/2017 0455   GFRAA 39 (L) 07/04/2017 0455   Lipase     Component Value Date/Time   LIPASE 31 07/02/2017 1722       Studies/Results: Ct Abdomen Pelvis Wo Contrast  Result Date: 07/02/2017 CLINICAL DATA:  Cholecystectomy 06/30/2017. Possible postoperative complication suspected. Renal transplant 2007. EXAM: CT ABDOMEN AND PELVIS WITHOUT CONTRAST TECHNIQUE: Multidetector CT imaging of the abdomen and pelvis was performed following the standard protocol without IV contrast. COMPARISON:  CT of the abdomen and pelvis 12/10/2016 FINDINGS: Lower chest: There is subsegmental atelectasis at both lung bases. Trace pericardial effusion. Hepatobiliary: The liver is homogeneous without focal abnormality. Surgical clips are seen in the gallbladder fossa region. There is a small fluid collection in the gallbladder fossa, containing air and gas. This is crescentic, measuring 5.7 x 1.4 by 3.2 centimeters. Pancreas: Unremarkable. No pancreatic ductal dilatation or surrounding inflammatory changes. Spleen: Normal in size without focal abnormality. Adrenals/Urinary Tract: Adrenal glands are normal. LEFT kidney is atrophic. RIGHT native kidney is not identified. RIGHT iliac fossa renal transplant appears stable compared to prior study. Transplant kidney appears enlarged and contains focal low-attenuation areas within the MEDIAL aspect and subcapsular midpole region, all stable findings. There is stable mild prominence of the renal pelvis and stranding in the  region of the hilum. Normal appearance of the urinary bladder. Stomach/Bowel: Small hiatal hernia. Stomach is otherwise normal. Small bowel loops are normal in appearance. The appendix is well seen and has a normal appearance. Loops of colon are normal in appearance. Vascular/Lymphatic: No significant vascular findings are present. No enlarged abdominal or pelvic lymph nodes. Reproductive: Hysterectomy.  No adnexal mass.  Other: No abdominal wall hernia or abnormality. No abdominopelvic ascites. Postoperative changes are identified in the anterior abdominal wall. Subcutaneous and small amount of intraperitoneal gas remain following recent surgery. Musculoskeletal: No acute or significant osseous findings. IMPRESSION: 1. Crescentic fluid and gas attenuation in the region of the gallbladder fossa, measuring 5.7 x 1.4 x 3.2 centimeters. 2. Stable appearance of RIGHT iliac fossa renal transplant. 3. Small hiatal hernia. 4. Normal appendix. 5. Hysterectomy. 6. Postoperative changes in the anterior abdominal wall. Electronically Signed   By: Nolon Nations M.D.   On: 07/02/2017 19:12   Dg Chest 2 View  Result Date: 07/02/2017 CLINICAL DATA:  Postop cholecystectomy.  Cough. EXAM: CHEST - 2 VIEW COMPARISON:  01/13/2017 FINDINGS: Mild cardiomegaly. Low lung volumes. No confluent opacities or effusions. No acute bony abnormality. IMPRESSION: Mild cardiomegaly.  No active disease. Electronically Signed   By: Rolm Baptise M.D.   On: 07/02/2017 18:13    Anti-infectives: Anti-infectives (From admission, onward)   None       Assessment/Plan S/p renal transplant 2007 - on myfortic, prograf, prednisone HTN DM-1 Anemia of chronic disease  Postoperative abdominal pain, nausea/vomiting/hematemesis S/p lap chole with normal IOC 4/18 Dr. Excell Seltzer - readmitted 4/20 - CT scan showed abdominal fluid collection in gallbladder fossa, felt to be normal amount for her postoperative period - LFTs WNL (4/21) - if pain worsens or LFTs elevate will check HIDA to r/o bile leak, but currently patient is improving - going for upper endoscopy today  ID - none FEN - IVF, NPO for procedure VTE - SCDs Foley - none  Plan - Clinically improving. Recheck LFTs today. Going for upper endoscopy today to evaluate hematemesis. Ok to advance diet as tolerated after procedure from our standpoint. If pain controlled, LFTs still WNL, tolerating diet,  and she continues to have no more n/v she is stable for discharge from surgical standpoint. F/u information on AVS.   LOS: 2 days    Wellington [place] , [facility] 07/04/2017, 8:58 AM Pager: [phone] Consults: [phone] Mon-Fri 7:00 am-4:30 pm Sat-Sun 7:00 am-11:30 am

## 2017-07-04 NOTE — Anesthesia Preprocedure Evaluation (Signed)
Anesthesia Evaluation  Patient identified by MRN, date of birth, ID band Patient awake    Reviewed: Allergy & Precautions, NPO status , Patient's Chart, lab work & pertinent test results  History of Anesthesia Complications Negative for: history of anesthetic complications  Airway Mallampati: II  TM Distance: >3 FB Neck ROM: Full    Dental  (+) Dental Advisory Given   Pulmonary neg pulmonary ROS,    breath sounds clear to auscultation       Cardiovascular hypertension, Pt. on medications (-) angina Rhythm:Regular Rate:Normal  '16 ECHO: EF 55-60%, valves OK   Neuro/Psych PSYCHIATRIC DISORDERS Anxiety negative neurological ROS     GI/Hepatic GERD  Medicated,Nausea and vomiting with acute chole   Endo/Other  diabetes, Insulin Dependent  Renal/GU Renal InsufficiencyRenal disease (creat 1.87)S/p renal transplant: steroids     Musculoskeletal   Abdominal   Peds  Hematology  (+) Blood dyscrasia (Hb 10.0), anemia ,   Anesthesia Other Findings   Reproductive/Obstetrics XXX: ovarian failure                             Anesthesia Physical  Anesthesia Plan  ASA: III  Anesthesia Plan: MAC   Post-op Pain Management:    Induction:   PONV Risk Score and Plan: 4 or greater and Scopolamine patch - Pre-op, Dexamethasone and Ondansetron  Airway Management Planned:   Additional Equipment:   Intra-op Plan:   Post-operative Plan:   Informed Consent: I have reviewed the patients History and Physical, chart, labs and discussed the procedure including the risks, benefits and alternatives for the proposed anesthesia with the patient or authorized representative who has indicated his/her understanding and acceptance.   Dental advisory given  Plan Discussed with: CRNA  Anesthesia Plan Comments:         Anesthesia Quick Evaluation

## 2017-07-04 NOTE — Consult Note (Signed)
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  Followed by outpatient [facility] in [place]. Missed appointment last Thursday due to acute illness, but will follow up upon discharge to reschedule. Wound type:neuropathic Pressure Injury POA: NA Measurement: 0.5cm round x 0.2cm deep Wound bed: red, dry with surrounding callus Drainage (amount, consistency, odor) none Periwound:intact Dressing procedure/placement/frequency: WCC is using hydrofera blue, an antimicrobial that we do not carry in house. I will provide this patient's Nursing staff with daily wound care orders for another antimicrobial (xerofrom) and she may resume the daily wound care with hydrofera blue upon discharge.  She is to resume care for this chronic wound at the outpatient [facility] in [facility] and plans to call for an appointment post discharge.  [facility] nursing team will not follow, but will remain available to this patient, the nursing and medical teams.  Please re-consult if needed. Thanks, Maudie Flakes, MSN, RN, [place], [facility]  Pager# [phone]

## 2017-07-05 DIAGNOSIS — E860 Dehydration: Secondary | ICD-10-CM

## 2017-07-05 LAB — BASIC METABOLIC PANEL
ANION GAP: 11 (ref 5–15)
BUN: 23 mg/dL — ABNORMAL HIGH (ref 6–20)
CALCIUM: 7 mg/dL — AB (ref 8.9–10.3)
CO2: 28 mmol/L (ref 22–32)
CREATININE: 1.51 mg/dL — AB (ref 0.44–1.00)
Chloride: 102 mmol/L (ref 101–111)
GFR, EST AFRICAN AMERICAN: 52 mL/min — AB (ref >60)
GFR, EST NON AFRICAN AMERICAN: 44 mL/min — AB (ref >60)
GLUCOSE: 131 mg/dL — AB (ref 65–99)
Potassium: 3.4 mmol/L — ABNORMAL LOW (ref 3.5–5.1)
Sodium: 141 mmol/L (ref 135–145)

## 2017-07-05 LAB — GLUCOSE, CAPILLARY
GLUCOSE-CAPILLARY: 135 mg/dL — AB (ref 65–99)
GLUCOSE-CAPILLARY: 152 mg/dL — AB (ref 65–99)
Glucose-Capillary: 140 mg/dL — ABNORMAL HIGH (ref 65–99)
Glucose-Capillary: 199 mg/dL — ABNORMAL HIGH (ref 65–99)

## 2017-07-05 MED ORDER — POTASSIUM CHLORIDE CRYS ER 20 MEQ PO TBCR
60.0000 meq | EXTENDED_RELEASE_TABLET | Freq: Once | ORAL | Status: AC
  Administered 2017-07-05: 60 meq via ORAL
  Filled 2017-07-05: qty 3

## 2017-07-05 MED ORDER — FAMOTIDINE 40 MG PO TABS: 40.0000 mg | ORAL_TABLET | Freq: Every day | ORAL | 0 refills | Status: DC

## 2017-07-05 NOTE — Discharge Summary (Signed)
Physician Discharge Summary  [name]  YDX:[id number]  DOB: Jul 20, 1985  DOA: 07/02/2017 PCP: [name], MD  Admit date: 07/02/2017 Discharge date: 07/05/2017  Admitted From: Home  Disposition:  Home   Recommendations for Outpatient Follow-up:  1. Follow up with PCP in 1 week 2. Please obtain BMP/CBC in one week to monitor renal function and hemoglobin  Discharge Condition: Stable CODE STATUS: Full  Diet recommendation: Heart Healthy   Brief/Interim Summary: For full details see H&P/Progress note, but in brief, [name] is a 32 year old female with medical history significant of diabetes mellitus type 2, hypertension, status post cholecystectomy 2 days ago, renal transplant on immunosuppressive and anemia of chronic disease.  Patient presented to the emergency department complaining of nausea and vomiting since she was discharged on 4/19.  Vomited was dark like coffee ground which was worrisome for blood.  Patient was unable to keep fluids or food down and came to the emergency department for further evaluation.  Other associated symptoms include abdominal pain and occasional cough.  Patient was admitted with working diagnosis of intractable nausea and vomiting complicated with hematemesis  Subjective: Patient seen and examined, she has no complaints today other than mild abdominal pain.  Blood pressure improved with no medications  Discharge Diagnoses/Hospital Course:  Hematemesis - resolved In setting of intractable nausea and vomiting, concern for possible Mallory-Weiss tear.  GI was consulted and patient is status post EGD, small hiatal hernia, GERD, and large amount of food residue.  Otherwise with no abnormalities.  Follow-up with PCP.  Intractable nausea and vomiting - resolved CT of the abdomen showed no evidence of obstruction, suspect to be postop complication. Unlikely to be adrenal insufficiency with lack of electrolytes abnormalities and normal vital signs.   Patient has remained on steroids.   Chronic kidney disease/status post renal transplant in 2007 Case was discussed with nephrology Myfortic was held overnight now that she is tolerating diet will resume Myfortic and Prograf.  Patient was treated with IV hydrocortisone, subsequently placed on oral prednisone at her home dose.  Fattening remained at baseline during hospital stay.  Essential hypertension Blood pressure was elevated after endoscopy, she was monitor without adding any BP medications.  Blood pressure normalized spontaneously.  Patient not on antihypertensive medication at home we will continue to monitor closely and follow-up with PCP for blood pressure checkup in 1 week  Status post cholecystectomy 4/19 Surgical input appreciated If abdominal pain worsens or return recommend HIDA scan to rule out biliary leak  Type 1 diabetes mellitus uncontrolled with hyperglycemia CBGs stable, continue current regimen  Anemia of chronic disease from CKD Hemoglobin stable EPO per renal  GERD Patient on Pepcid 20 mg twice daily, recommendation was to do Protonix 40 mg twice daily, however patient refusing as she has tried PPIs before and has not worked for her.  All other chronic medical condition were stable during the hospitalization.  On the day of the discharge the patient's vitals were stable, and no other acute medical condition were reported by patient. the patient was felt safe to be discharge to home  Discharge Instructions  You were cared for by a hospitalist during your hospital stay. If you have any questions about your discharge medications or the care you received while you were in the hospital after you are discharged, you can call the unit and asked to speak with the hospitalist on call if the hospitalist that took care of you is not available. Once you are discharged, your  primary care physician will handle any further medical issues. Please note that NO REFILLS for any  discharge medications will be authorized once you are discharged, as it is imperative that you return to your primary care physician (or establish a relationship with a primary care physician if you do not have one) for your aftercare needs so that they can reassess your need for medications and monitor your lab values.  Discharge Instructions    Call MD for:  difficulty breathing, headache or visual disturbances   Complete by:  As directed    Call MD for:  extreme fatigue   Complete by:  As directed    Call MD for:  hives   Complete by:  As directed    Call MD for:  persistant dizziness or light-headedness   Complete by:  As directed    Call MD for:  persistant nausea and vomiting   Complete by:  As directed    Call MD for:  redness, tenderness, or signs of infection (pain, swelling, redness, odor or green/yellow discharge around incision site)   Complete by:  As directed    Call MD for:  severe uncontrolled pain   Complete by:  As directed    Call MD for:  temperature >100.4   Complete by:  As directed    Diet - low sodium heart healthy   Complete by:  As directed    Increase activity slowly   Complete by:  As directed      Allergies as of 07/05/2017      Reactions   Benadryl [diphenhydramine-zinc Acetate] Shortness Of Breath   Motrin [ibuprofen] Shortness Of Breath, Itching   Per pt   Banana Other (See Comments)   Sick on the stomach   Diphenhydramine Hcl    REACTION: Stopped breathing in ICU   Doxycycline    Shortness of Breath    Iron Dextran    REACTION: vein irritation   Shellfish Allergy Hives      Medication List    STOP taking these medications   ZANTAC 150 MG tablet Generic drug:  ranitidine     TAKE these medications   ACCU-CHEK SOFTCLIX LANCETS lancets Use to check blood sugar 4 times per day.   acetaminophen 500 MG tablet Commonly known as:  TYLENOL Take 1,000 mg by mouth daily as needed for moderate pain.   cetirizine 10 MG tablet Commonly known  as:  ZYRTEC Take 10 mg by mouth daily.   cyclobenzaprine 10 MG tablet Commonly known as:  FLEXERIL Take 1 tablet (10 mg total) by mouth 2 (two) times daily as needed for muscle spasms.   diazepam 5 MG tablet Commonly known as:  VALIUM Take 1 tablet (5 mg total) by mouth every 6 (six) hours as needed for anxiety (spasms).   epoetin alfa 10000 UNIT/ML injection Commonly known as:  EPOGEN,PROCRIT Inject 30,000 Units into the skin every 14 (fourteen) days.   famotidine 40 MG tablet Commonly known as:  PEPCID Take 1 tablet (40 mg total) by mouth daily.   fluticasone 50 MCG/ACT nasal spray Commonly known as:  FLONASE Place 2 sprays into both nostrils daily.   furosemide 20 MG tablet Commonly known as:  LASIX Take 20 mg by mouth daily as needed. Feet swelling   gabapentin 100 MG capsule Commonly known as:  NEURONTIN Take 200 mg by mouth daily as needed (for feet).   hydrOXYzine 50 MG tablet Commonly known as:  ATARAX/VISTARIL Take 50 mg by mouth 2 (two)  times daily as needed for itching.   insulin degludec 100 UNIT/ML Sopn FlexTouch Pen Commonly known as:  TRESIBA FLEXTOUCH Inject 1.3 mLs (130 Units total) into the skin daily. And pen needles 1/day What changed:    how much to take  additional instructions   loperamide 2 MG capsule Commonly known as:  IMODIUM Take 2 mg by mouth as needed for diarrhea or loose stools. Take 2 tablets as needed for diarrhea.   mycophenolate 360 MG Tbec EC tablet Commonly known as:  MYFORTIC Take 720 mg by mouth 2 (two) times daily.   NOVOLOG FLEXPEN 100 UNIT/ML FlexPen Generic drug:  insulin aspart Inject 15 Units into the skin 2 (two) times daily with a meal. 81-150=10 units 51-200=11 units 201-250=12 units 251-100=13 units 301-350=14 units >351=15 u Per sliding scale   ondansetron 4 MG tablet Commonly known as:  ZOFRAN Take 1 tablet (4 mg total) by mouth every 8 (eight) hours as needed for nausea or vomiting.   oxyCODONE 5 MG  immediate release tablet Commonly known as:  Oxy IR/ROXICODONE Take 1 tablet (5 mg total) by mouth every 6 (six) hours as needed for severe pain.   predniSONE 5 MG tablet Commonly known as:  DELTASONE Take 5 mg by mouth at bedtime.   simvastatin 20 MG tablet Commonly known as:  ZOCOR Take 20 mg by mouth 3 (three) times a week. MWF.   tacrolimus 1 MG capsule Commonly known as:  PROGRAF Take 2 mg by mouth 2 (two) times daily.      Follow-up [facility], [place]. Go on 07/12/2017.   Specialty:  General Surgery Why:  Your appointment is 07/12/17 at 8:45AM. Please arrive 30 minutes prior to your appointment to check in and fill out paperwork. Bring photo ID and insurance information. Contact information: [address] [place] [phone]       [name], MD. Schedule an appointment as soon as possible for a visit in 1 week(s).   Specialty:  Internal Medicine Why:  Hospital follow-up Contact information: [address] [place] [address] [phone]          Allergies  Allergen Reactions  . Benadryl [Diphenhydramine-Zinc Acetate] Shortness Of Breath  . Motrin [Ibuprofen] Shortness Of Breath and Itching    Per pt  . Banana Other (See Comments)    Sick on the stomach  . Diphenhydramine Hcl     REACTION: Stopped breathing in ICU  . Doxycycline     Shortness of Breath   . Iron Dextran     REACTION: vein irritation  . Shellfish Allergy Hives    Consultations:  GI  General surgery    Procedures/Studies: Ct Abdomen Pelvis Wo Contrast  Result Date: 07/02/2017 CLINICAL DATA:  Cholecystectomy 06/30/2017. Possible postoperative complication suspected. Renal transplant 2007. EXAM: CT ABDOMEN AND PELVIS WITHOUT CONTRAST TECHNIQUE: Multidetector CT imaging of the abdomen and pelvis was performed following the standard protocol without IV contrast. COMPARISON:  CT of the abdomen and pelvis  12/10/2016 FINDINGS: Lower chest: There is subsegmental atelectasis at both lung bases. Trace pericardial effusion. Hepatobiliary: The liver is homogeneous without focal abnormality. Surgical clips are seen in the gallbladder fossa region. There is a small fluid collection in the gallbladder fossa, containing air and gas. This is crescentic, measuring 5.7 x 1.4 by 3.2 centimeters. Pancreas: Unremarkable. No pancreatic ductal dilatation or surrounding inflammatory changes. Spleen: Normal in size without focal abnormality. Adrenals/Urinary Tract: Adrenal glands are  normal. LEFT kidney is atrophic. RIGHT native kidney is not identified. RIGHT iliac fossa renal transplant appears stable compared to prior study. Transplant kidney appears enlarged and contains focal low-attenuation areas within the MEDIAL aspect and subcapsular midpole region, all stable findings. There is stable mild prominence of the renal pelvis and stranding in the region of the hilum. Normal appearance of the urinary bladder. Stomach/Bowel: Small hiatal hernia. Stomach is otherwise normal. Small bowel loops are normal in appearance. The appendix is well seen and has a normal appearance. Loops of colon are normal in appearance. Vascular/Lymphatic: No significant vascular findings are present. No enlarged abdominal or pelvic lymph nodes. Reproductive: Hysterectomy.  No adnexal mass. Other: No abdominal wall hernia or abnormality. No abdominopelvic ascites. Postoperative changes are identified in the anterior abdominal wall. Subcutaneous and small amount of intraperitoneal gas remain following recent surgery. Musculoskeletal: No acute or significant osseous findings. IMPRESSION: 1. Crescentic fluid and gas attenuation in the region of the gallbladder fossa, measuring 5.7 x 1.4 x 3.2 centimeters. 2. Stable appearance of RIGHT iliac fossa renal transplant. 3. Small hiatal hernia. 4. Normal appendix. 5. Hysterectomy. 6. Postoperative changes in the  anterior abdominal wall. Electronically Signed   By: Nolon Nations M.D.   On: 07/02/2017 19:12   Dg Chest 2 View  Result Date: 07/02/2017 CLINICAL DATA:  Postop cholecystectomy.  Cough. EXAM: CHEST - 2 VIEW COMPARISON:  01/13/2017 FINDINGS: Mild cardiomegaly. Low lung volumes. No confluent opacities or effusions. No acute bony abnormality. IMPRESSION: Mild cardiomegaly.  No active disease. Electronically Signed   By: Rolm Baptise M.D.   On: 07/02/2017 18:13   Mr Foot Right Wo Contrast  Result Date: 06/10/2017 CLINICAL DATA:  Chronic great toe ulcer.  Injured 1 month ago. EXAM: MRI OF THE RIGHT FOREFOOT WITHOUT CONTRAST TECHNIQUE: Multiplanar, multisequence MR imaging of the right foot was performed. No intravenous contrast was administered. COMPARISON:  Radiographs 05/26/2017 FINDINGS: Diffuse marrow signal abnormality in the distal phalanx of the great toe with low T1 and high T2 signal consistent with osteomyelitis. There appears to be an open wound along the dorsum of the great toe near the nail bed. No findings for septic arthritis at the interphalangeal joint. The other bony structures are intact. No other areas of osteomyelitis are suspected. T1 and T2 signal abnormality in the medial sesamoid of the great toe is likely sesamoiditis. There is focal cellulitis involving the great toe but no discrete drainable soft tissue abscess. Mild edema like signal abnormality in the short flexor muscles suggesting myofasciitis. No findings for pyomyositis. IMPRESSION: MR findings consistent with osteomyelitis involving the distal phalanx of the great toe. No discrete drainable soft tissue abscess, septic arthritis or pyomyositis. Electronically Signed   By: Marijo Sanes M.D.   On: 06/10/2017 15:04   Dg Foot Complete Left  Result Date: 06/21/2017 Please see detailed radiograph report in office note.  Dg Foot Complete Right  Result Date: 06/21/2017 Please see detailed radiograph report in office note.  Dg  C-arm 1-60 Min-no Report  Result Date: 06/30/2017 Fluoroscopy was utilized by the requesting physician.  No radiographic interpretation.   US Abdomen Limited Ruq  Result Date: 06/29/2017 CLINICAL DATA:  RIGHT upper quadrant pain. History of kidney stones, renal transplant, melanoma. EXAM: ULTRASOUND ABDOMEN LIMITED RIGHT UPPER QUADRANT COMPARISON:  Abdominal ultrasound June 28, 2017. CT abdomen and pelvis December 10, 2016. FINDINGS: Gallbladder: Multiple echogenic gallstones with acoustic shadowing measuring to 7 mm. No gallbladder wall thickening or pericholecystic fluid. No sonographic Murphy's  sign elicited. Common bile duct: Diameter: 6 mm, top-normal.  No choledocholithiasis. Liver: Two echogenic masses measuring to 2 cm, unchanged from yesterday. Heterogeneous echotexture. Vein is patent on color Doppler imaging with normal direction of blood flow towards the liver. IMPRESSION: 1. Stable cholelithiasis without sonographic findings of acute cholecystitis. 2. Two small echogenic liver lesions could reflect hemangiomas though, would be better characterized on NONEMERGENT MRI with and without contrast, when patient's renal function improves. Electronically Signed   By: Elon Alas M.D.   On: 06/29/2017 17:14   US Abdomen Limited Ruq  Result Date: 06/28/2017 CLINICAL DATA:  Abdominal pain, nausea and vomiting EXAM: ULTRASOUND ABDOMEN LIMITED RIGHT UPPER QUADRANT COMPARISON:  12/10/2016 FINDINGS: Gallbladder: Small echogenic shadowing gallstones, largest measures 7 mm. These appear mobile. No associated wall thickening or pericholecystic fluid. Wall thickness measures 2 mm. No Murphy's sign elicited. Common bile duct: Diameter: 4.5 mm Liver: Mildly heterogeneous increased echogenicity. Slight diffuse intrahepatic biliary prominence, nonspecific. There are 2 adjacent ill-defined echogenic areas or lesions in the right hepatic lobe 1 measuring 1.5 x 1.9 x 1.7 cm and a second measuring 1.5 x 1.4 x  1.8 cm. No available contrast-enhanced studies of the liver to accurately assess for a previous hepatic abnormality. Further evaluation will be recommended with abdominal MRI without and with contrast. Portal vein is patent on color Doppler imaging with normal direction of blood flow towards the liver. IMPRESSION: Cholelithiasis, measuring up to 7 mm. No signs of cholecystitis. Mildly heterogeneous liver with biliary prominence and 2 ill-defined echogenic right hepatic lesions largest measures 1.9 cm remaining indeterminate. These warrant further evaluation with nonemergent abdominal MRI without and with contrast. Electronically Signed   By: Jerilynn Mages.  Shick M.D.   On: 06/28/2017 14:57     Discharge Exam: Vitals:   07/04/17 2035 07/05/17 0439  BP: (!) 153/106 127/89  Pulse: 85 82  Resp:  16  Temp: 98.2 F (36.8 C) 98.1 F (36.7 C)  SpO2: 99% 96%   Vitals:   07/04/17 1419 07/04/17 1634 07/04/17 2035 07/05/17 0439  BP: (!) 176/117 (!) 161/116 (!) 153/106 127/89  Pulse:  91 85 82  Resp:  16  16  Temp:  98.4 F (36.9 C) 98.2 F (36.8 C) 98.1 F (36.7 C)  TempSrc: Oral Oral Oral Oral  SpO2: 95% 100% 99% 96%  Weight:      Height:        General: Pt is alert, awake, not in acute distress Cardiovascular: RRR, S1/S2 +, no rubs, no gallops Respiratory: CTA bilaterally, no wheezing, no rhonchi Abdominal: Soft, Mild epigastric tenderness.  The results of significant diagnostics from this hospitalization (including imaging, microbiology, ancillary and laboratory) are listed below for reference.     Microbiology: Recent Results (from the past 240 hour(s))  MRSA PCR Screening     Status: None   Collection Time: 06/30/17  5:42 AM  Result Value Ref Range Status   MRSA by PCR NEGATIVE NEGATIVE Final    Comment:        The GeneXpert MRSA Assay (FDA approved for NASAL specimens only), is one component of a comprehensive MRSA colonization surveillance program. It is not intended to diagnose  MRSA infection nor to guide or monitor treatment for MRSA infections. Performed at [facility], [place] [address] [place], [place] [address]      Labs: BNP (last 3 results) No results for input(s): BNP in the last 8760 hours. Basic Metabolic Panel: Recent Labs  Lab 06/30/17 0421 07/02/17 1722  07/03/17 0458 07/04/17 0455 07/05/17 0510  NA 140 140 142 140 141  K 3.9 3.2* 3.9 3.9 3.4*  CL 106 94* 104 102 102  CO2 22 30 28 28 28   GLUCOSE 98 227* 178* 172* 131*  BUN 38* 28* 29* 25* 23*  CREATININE 1.87* 1.67* 1.69* 1.92* 1.51*  CALCIUM 7.0* 7.5* 6.1* 6.4* 7.0*  MG 1.5* 1.3* 2.2 1.7  --   PHOS 4.4 4.4 3.9  --   --    Liver Function Tests: Recent Labs  Lab 06/29/17 1655 06/30/17 0421 07/02/17 1722 07/03/17 0458 07/04/17 0451  AST 16 16 23 17  14*  ALT 15 14 31 22 17   ALKPHOS 73 67 79 56 50  BILITOT 0.7 0.7 1.1 0.5 0.4  PROT 8.0 7.4 8.1 6.0* 5.8*  ALBUMIN 3.8 3.4* 3.9 2.9* 2.7*   Recent Labs  Lab 06/28/17 1419 06/29/17 1655 07/02/17 1722  LIPASE 26 30 31    No results for input(s): AMMONIA in the last 168 hours. CBC: Recent Labs  Lab 06/28/17 1419 06/29/17 1655 06/30/17 0421 07/02/17 1722 07/03/17 0458 07/04/17 0455 07/04/17 1546  WBC 11.5* 10.1 9.8 14.4* 11.9* 7.4  --   NEUTROABS 9.4*  --   --   --  9.1*  --   --   HGB 10.7* 10.7* 10.0* 12.4 9.1* 8.5* 9.8*  HCT 33.8* 34.2* 32.0* 39.3 29.1* 28.0* 32.0*  MCV 88.5 87.9 89.1 88.7 91.2 90.9  --   PLT 288 293 276 285 228 200  --    Cardiac Enzymes: No results for input(s): CKTOTAL, CKMB, CKMBINDEX, TROPONINI in the last 168 hours. BNP: Invalid input(s): POCBNP CBG: Recent Labs  Lab 07/04/17 1655 07/04/17 2029 07/05/17 0026 07/05/17 0439 07/05/17 0749  GLUCAP 247* 279* 199* 135* 140*   D-Dimer No results for input(s): DDIMER in the last 72 hours. Hgb A1c Recent Labs    07/03/17 0458  HGBA1C 8.1*   Lipid Profile No results for input(s): CHOL, HDL, LDLCALC, TRIG, CHOLHDL,  LDLDIRECT in the last 72 hours. Thyroid function studies Recent Labs    07/03/17 0458  TSH 0.477   Anemia work up No results for input(s): VITAMINB12, FOLATE, FERRITIN, TIBC, IRON, RETICCTPCT in the last 72 hours. Urinalysis    Component Value Date/Time   COLORURINE STRAW (A) 07/04/2017 0016   APPEARANCEUR CLEAR 07/04/2017 0016   LABSPEC 1.008 07/04/2017 0016   PHURINE 5.0 07/04/2017 0016   GLUCOSEU NEGATIVE 07/04/2017 0016   HGBUR SMALL (A) 07/04/2017 0016   HGBUR negative 01/26/2010 1026   BILIRUBINUR NEGATIVE 07/04/2017 0016   BILIRUBINUR NEG 01/01/2011 1050   KETONESUR NEGATIVE 07/04/2017 0016   PROTEINUR 100 (A) 07/04/2017 0016   UROBILINOGEN 0.2 01/09/2015 1333   NITRITE NEGATIVE 07/04/2017 0016   LEUKOCYTESUR NEGATIVE 07/04/2017 0016   Sepsis Labs Invalid input(s): PROCALCITONIN,  WBC,  LACTICIDVEN Microbiology Recent Results (from the past 240 hour(s))  MRSA PCR Screening     Status: None   Collection Time: 06/30/17  5:42 AM  Result Value Ref Range Status   MRSA by PCR NEGATIVE NEGATIVE Final    Comment:        The GeneXpert MRSA Assay (FDA approved for NASAL specimens only), is one component of a comprehensive MRSA colonization surveillance program. It is not intended to diagnose MRSA infection nor to guide or monitor treatment for MRSA infections. Performed at [facility], [place] [address], [place], [place] [address]      Time coordinating discharge: 35 minutes  SIGNED:  Chipper Oman, MD  [facility] 07/05/2017, 11:33 AM  Pager please text page via  [email or web address]  Note - This record has been created using [employer]. Chart creation errors have been sought, but may not always have been located. Such creation errors do not reflect on the standard of medical care.

## 2017-07-05 NOTE — Progress Notes (Signed)
  [facility] Progress Note  1 Day Post-Op  Subjective: CC- abdominal pain Patient states that she continues to have some upper abdominal pain. Denies any recent n/v. She is tolerating a soft diet. States that PO intake does not make her pain worse.  Upper endoscopy yesterday showed moderate reflux esophagitis.  Objective: Vital signs in last 24 hours: Temp:  [98 F (36.7 C)-98.4 F (36.9 C)] 98.1 F (36.7 C) (04/23 0439) Pulse Rate:  [82-91] 82 (04/23 0439) Resp:  [15-18] 16 (04/23 0439) BP: (127-176)/(89-117) 127/89 (04/23 0439) SpO2:  [95 %-100 %] 96 % (04/23 0439) Weight:  [174 lb (78.9 kg)] 174 lb (78.9 kg) (04/22 1254) Last BM Date: 07/03/17  Intake/Output from previous day: 04/22 0701 - 04/23 0700 In: 1440 [P.O.:840; I.V.:600] Out: 1800 [Urine:1800] Intake/Output this shift: No intake/output data recorded.  PE: Gen:  Alert, NAD, pleasant HEENT: EOM's intact, pupils equal and round Card:  RRR, no M/G/R heard Pulm:  CTAB, no W/R/R, effort normal Abd: Soft, ND, mild RUQ and epigastric tenderness, +BS, lap incisions cdi   Lab Results:  Recent Labs    07/03/17 0458 07/04/17 0455 07/04/17 1546  WBC 11.9* 7.4  --   HGB 9.1* 8.5* 9.8*  HCT 29.1* 28.0* 32.0*  PLT 228 200  --    BMET Recent Labs    07/04/17 0455 07/05/17 0510  NA 140 141  K 3.9 3.4*  CL 102 102  CO2 28 28  GLUCOSE 172* 131*  BUN 25* 23*  CREATININE 1.92* 1.51*  CALCIUM 6.4* 7.0*   PT/INR No results for input(s): LABPROT, INR in the last 72 hours. CMP     Component Value Date/Time   NA 141 07/05/2017 0510   K 3.4 (L) 07/05/2017 0510   CL 102 07/05/2017 0510   CO2 28 07/05/2017 0510   GLUCOSE 131 (H) 07/05/2017 0510   BUN 23 (H) 07/05/2017 0510   CREATININE 1.51 (H) 07/05/2017 0510   CALCIUM 7.0 (L) 07/05/2017 0510   PROT 5.8 (L) 07/04/2017 0451   ALBUMIN 2.7 (L) 07/04/2017 0451   AST 14 (L) 07/04/2017 0451   ALT 17 07/04/2017 0451   ALKPHOS 50 07/04/2017 0451   BILITOT 0.4 07/04/2017 0451   GFRNONAA 44 (L) 07/05/2017 0510   GFRAA 52 (L) 07/05/2017 0510   Lipase     Component Value Date/Time   LIPASE 31 07/02/2017 1722       Studies/Results: No results found.  Anti-infectives: Anti-infectives (From admission, onward)   None       Assessment/Plan S/p renal transplant 2007 - on myfortic, prograf, prednisone HTN DM-1 Anemia of chronic disease  Postoperative abdominal pain, nausea/vomiting/hematemesis S/p lap chole with normal IOC 4/18 Dr. Excell Seltzer - readmitted 4/20 - CT scan showed abdominal fluid collection in gallbladder fossa, felt to be normal amount for her postoperative period - LFTs WNL (4/23) - if pain worsens or LFTs elevate will check HIDA to r/o bile leak, but currently patient is improving - s/p EGD 07/04/17: moderate reflux esophagitis  ID - none FEN - soft diet VTE - SCDs Foley - none  Plan - N/v resolved, abdominal pain improving, tolerating diet. Patient stable for discharge from surgical standpoint. F/u information on AVS and work note on chart.    LOS: 3 days    Wellington Hampshire , [facility] 07/05/2017, 9:18 AM Pager: [phone] Consults: [phone] Mon-Fri 7:00 am-4:30 pm Sat-Sun 7:00 am-11:30 am

## 2017-07-05 NOTE — Consult Note (Signed)
   [facility] Inpatient Consult   07/05/2017  [name] 1985/12/16 [id number]    Patient screened for potential [facility] Care Management services due to recent hospital readmission.   Spoke with patient at bedside. Confirmed Primary Care Provider is Dr. Jeanie Cooks. Dr. Jeanie Cooks is not a [facility] Provider. Therefore [name] is not eligible for [facility] Care Management services at this time.    [name], MSN-Ed, RN,BSN [facility] Liaison [phone]

## 2017-07-05 NOTE — Discharge Instructions (Signed)

## 2017-07-06 ENCOUNTER — Emergency Department (HOSPITAL_COMMUNITY): Payer: Medicare Other

## 2017-07-06 ENCOUNTER — Other Ambulatory Visit: Payer: Self-pay

## 2017-07-06 ENCOUNTER — Encounter (HOSPITAL_COMMUNITY): Payer: Self-pay | Admitting: Emergency Medicine

## 2017-07-06 ENCOUNTER — Emergency Department (HOSPITAL_COMMUNITY)
Admission: EM | Admit: 2017-07-06 | Discharge: 2017-07-06 | Disposition: A | Discharge status: Home / Self Care | Payer: Medicare Other | Attending: Emergency Medicine | Admitting: Emergency Medicine

## 2017-07-06 DIAGNOSIS — N182 Chronic kidney disease, stage 2 (mild): Secondary | ICD-10-CM | POA: Diagnosis not present

## 2017-07-06 DIAGNOSIS — Z940 Kidney transplant status: Secondary | ICD-10-CM | POA: Insufficient documentation

## 2017-07-06 DIAGNOSIS — Z9049 Acquired absence of other specified parts of digestive tract: Secondary | ICD-10-CM | POA: Insufficient documentation

## 2017-07-06 DIAGNOSIS — R1084 Generalized abdominal pain: Secondary | ICD-10-CM | POA: Diagnosis not present

## 2017-07-06 DIAGNOSIS — R112 Nausea with vomiting, unspecified: Secondary | ICD-10-CM | POA: Insufficient documentation

## 2017-07-06 DIAGNOSIS — E1022 Type 1 diabetes mellitus with diabetic chronic kidney disease: Secondary | ICD-10-CM | POA: Diagnosis not present

## 2017-07-06 DIAGNOSIS — Z79899 Other long term (current) drug therapy: Secondary | ICD-10-CM | POA: Insufficient documentation

## 2017-07-06 DIAGNOSIS — Z9889 Other specified postprocedural states: Secondary | ICD-10-CM | POA: Insufficient documentation

## 2017-07-06 DIAGNOSIS — N1330 Unspecified hydronephrosis: Secondary | ICD-10-CM | POA: Diagnosis not present

## 2017-07-06 DIAGNOSIS — I129 Hypertensive chronic kidney disease with stage 1 through stage 4 chronic kidney disease, or unspecified chronic kidney disease: Secondary | ICD-10-CM | POA: Insufficient documentation

## 2017-07-06 LAB — URINALYSIS, ROUTINE W REFLEX MICROSCOPIC
Bacteria, UA: NONE SEEN
Bilirubin Urine: NEGATIVE
Glucose, UA: 50 mg/dL — AB
HGB URINE DIPSTICK: NEGATIVE
Ketones, ur: NEGATIVE mg/dL
LEUKOCYTES UA: NEGATIVE
Nitrite: NEGATIVE
Specific Gravity, Urine: 1.011 (ref 1.005–1.030)
pH: 8 (ref 5.0–8.0)

## 2017-07-06 LAB — I-STAT CHEM 8, ED
BUN: 21 mg/dL — AB (ref 6–20)
CALCIUM ION: 0.92 mmol/L — AB (ref 1.15–1.40)
CHLORIDE: 95 mmol/L — AB (ref 101–111)
Creatinine, Ser: 1.4 mg/dL — ABNORMAL HIGH (ref 0.44–1.00)
Glucose, Bld: 258 mg/dL — ABNORMAL HIGH (ref 65–99)
HCT: 35 % — ABNORMAL LOW (ref 36.0–46.0)
Hemoglobin: 11.9 g/dL — ABNORMAL LOW (ref 12.0–15.0)
Potassium: 3.7 mmol/L (ref 3.5–5.1)
Sodium: 139 mmol/L (ref 135–145)
TCO2: 31 mmol/L (ref 22–32)

## 2017-07-06 LAB — CBC WITH DIFFERENTIAL/PLATELET
Basophils Absolute: 0 10*3/uL (ref 0.0–0.1)
Basophils Relative: 0 %
EOS ABS: 0.1 10*3/uL (ref 0.0–0.7)
Eosinophils Relative: 1 %
HCT: 35.6 % — ABNORMAL LOW (ref 36.0–46.0)
HEMOGLOBIN: 11.1 g/dL — AB (ref 12.0–15.0)
LYMPHS ABS: 1.2 10*3/uL (ref 0.7–4.0)
Lymphocytes Relative: 12 %
MCH: 27.8 pg (ref 26.0–34.0)
MCHC: 31.2 g/dL (ref 30.0–36.0)
MCV: 89.2 fL (ref 78.0–100.0)
Monocytes Absolute: 0.6 10*3/uL (ref 0.1–1.0)
Monocytes Relative: 6 %
NEUTROS ABS: 8.3 10*3/uL — AB (ref 1.7–7.7)
NEUTROS PCT: 81 %
Platelets: 267 10*3/uL (ref 150–400)
RBC: 3.99 MIL/uL (ref 3.87–5.11)
RDW: 14.7 % (ref 11.5–15.5)
WBC: 10.2 10*3/uL (ref 4.0–10.5)

## 2017-07-06 LAB — COMPREHENSIVE METABOLIC PANEL
ALBUMIN: 4 g/dL (ref 3.5–5.0)
ALK PHOS: 67 U/L (ref 38–126)
ALT: 20 U/L (ref 14–54)
AST: 16 U/L (ref 15–41)
Anion gap: 12 (ref 5–15)
BUN: 22 mg/dL — AB (ref 6–20)
CALCIUM: 7.8 mg/dL — AB (ref 8.9–10.3)
CO2: 28 mmol/L (ref 22–32)
Chloride: 97 mmol/L — ABNORMAL LOW (ref 101–111)
Creatinine, Ser: 1.49 mg/dL — ABNORMAL HIGH (ref 0.44–1.00)
GFR calc Af Amer: 52 mL/min — ABNORMAL LOW (ref >60)
GFR calc non Af Amer: 45 mL/min — ABNORMAL LOW (ref >60)
GLUCOSE: 257 mg/dL — AB (ref 65–99)
Potassium: 3.7 mmol/L (ref 3.5–5.1)
SODIUM: 137 mmol/L (ref 135–145)
Total Bilirubin: 0.6 mg/dL (ref 0.3–1.2)
Total Protein: 8 g/dL (ref 6.5–8.1)

## 2017-07-06 LAB — I-STAT BETA HCG BLOOD, ED (MC, WL, AP ONLY): I-stat hCG, quantitative: 5.7 m[IU]/mL — ABNORMAL HIGH (ref <5)

## 2017-07-06 LAB — CBG MONITORING, ED: Glucose-Capillary: 229 mg/dL — ABNORMAL HIGH (ref 65–99)

## 2017-07-06 LAB — LIPASE, BLOOD: Lipase: 49 U/L (ref 11–51)

## 2017-07-06 MED ORDER — HYDROMORPHONE HCL 1 MG/ML IJ SOLN
1.0000 mg | Freq: Once | INTRAMUSCULAR | Status: AC
  Administered 2017-07-06: 1 mg via INTRAVENOUS
  Filled 2017-07-06: qty 1

## 2017-07-06 MED ORDER — HYDROMORPHONE HCL 1 MG/ML IJ SOLN
0.5000 mg | Freq: Once | INTRAMUSCULAR | Status: AC
  Administered 2017-07-06: 0.5 mg via INTRAVENOUS
  Filled 2017-07-06: qty 1

## 2017-07-06 MED ORDER — PROMETHAZINE HCL 25 MG RE SUPP: 25.0000 mg | Freq: Four times a day (QID) | RECTAL | 0 refills | Status: DC | PRN

## 2017-07-06 MED ORDER — LACTATED RINGERS IV BOLUS
2000.0000 mL | Freq: Once | INTRAVENOUS | Status: AC
  Administered 2017-07-06: 2000 mL via INTRAVENOUS

## 2017-07-06 MED ORDER — ONDANSETRON 4 MG PO TBDP: ORAL_TABLET | ORAL | 0 refills | Status: DC

## 2017-07-06 MED ORDER — METOCLOPRAMIDE HCL 5 MG/ML IJ SOLN
10.0000 mg | Freq: Once | INTRAMUSCULAR | Status: DC
  Filled 2017-07-06: qty 2

## 2017-07-06 MED ORDER — RANITIDINE HCL 150 MG PO TABS: 150.0000 mg | ORAL_TABLET | Freq: Two times a day (BID) | ORAL | 0 refills | Status: DC

## 2017-07-06 MED ORDER — FAMOTIDINE IN NACL 20-0.9 MG/50ML-% IV SOLN
20.0000 mg | Freq: Once | INTRAVENOUS | Status: AC
  Administered 2017-07-06: 20 mg via INTRAVENOUS
  Filled 2017-07-06: qty 50

## 2017-07-06 MED ORDER — PROCHLORPERAZINE EDISYLATE 10 MG/2ML IJ SOLN
10.0000 mg | Freq: Once | INTRAMUSCULAR | Status: AC
  Administered 2017-07-06: 10 mg via INTRAVENOUS
  Filled 2017-07-06: qty 2

## 2017-07-06 MED ORDER — MORPHINE SULFATE 15 MG PO TABS: 15.0000 mg | ORAL_TABLET | ORAL | 0 refills | Status: DC | PRN

## 2017-07-06 MED ORDER — MORPHINE SULFATE 15 MG PO TABS
15.0000 mg | ORAL_TABLET | ORAL | Status: DC | PRN
Start: 2017-07-06 — End: 2017-07-06

## 2017-07-06 MED ORDER — MORPHINE SULFATE 15 MG PO TABS
15.0000 mg | ORAL_TABLET | Freq: Once | ORAL | Status: AC
  Administered 2017-07-06: 15 mg via ORAL
  Filled 2017-07-06: qty 1

## 2017-07-06 NOTE — ED Triage Notes (Signed)
Pt complaint of severe mid abdominal pain at surgical site; unable to take pain medication related to n/v.

## 2017-07-06 NOTE — ED Provider Notes (Signed)
[place] DEPT Provider Note   CSN: [id number] Arrival date & time: 07/06/17  0756     History   Chief Complaint Chief Complaint  Patient presents with  . Abdominal Pain  . Emesis    HPI [name] is a 32 y.o. female.   32 yo F with a chief complaint of recurrent diffuse abdominal pain nausea and vomiting post cholecystectomy.  Patient was recently in the hospital and thought to have an upper GI bleed.  Had serial hemoglobins that were unremarkable and EGD that showed some significant food in the pylorus but no concerning signs of bleeding.  Her pain and nausea were controlled and she was discharged home.  She took a pain pill this morning and had recurrence of her diffuse abdominal pain and vomiting.  She describes the pain is worse about her midline surgical wound.  But when asked later she points to her entire abdomen is area of pain.  Describes it as sharp and burning.  Nothing seems to make it better or worse.  Husband is concerned that her pain medication is making her sick and this is her primary issue.  The history is provided by the patient and the spouse.  Abdominal Pain   This is a recurrent problem. The current episode started 6 to 12 hours ago. The problem occurs constantly. The problem has not changed since onset.The pain is located in the generalized abdominal region. The quality of the pain is sharp and shooting. The pain is at a severity of 9/10. The pain is severe. Associated symptoms include nausea and vomiting. Pertinent negatives include fever, dysuria, headaches, arthralgias and myalgias. Nothing aggravates the symptoms. Nothing relieves the symptoms.  Emesis   Associated symptoms include abdominal pain. Pertinent negatives include no arthralgias, no chills, no fever, no headaches and no myalgias.    Past Medical History:  Diagnosis Date  . Chronic kidney disease   . Diabetes mellitus    Pt reports diagnosis in June 2011  .  Hyperlipidemia   . Hypertension   . Kidney transplant recipient   . 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 Pyelonephritis 06/23/2014  . UTI (urinary tract infection) 01/09/2015  . 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 Hematemesis 07/02/2017  . Dehydration 07/02/2017  . Chronic cholecystitis 06/29/2017  . Type 1 diabetes mellitus with complication, uncontrolled ([place]) 05/25/2015  . Intractable nausea and vomiting 01/09/2015  . Renal transplant recipient   . Immunosuppressed status ([place])   . 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 CHRONIC KIDNEY DISEASE STAGE II (MILD) 01/04/2010  . OVARIAN FAILURE, PREMATURE 03/11/2009  . 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 SECONDARY HYPERPARATHYROIDISM 12/05/2007  . OBESITY 09/25/2007  . Anemia in chronic renal disease 09/25/2007  . LEARNING DISABILITY 09/25/2007  . Essential hypertension, benign 09/25/2007  . KIDNEY TRANSPLANTATION, HX OF 09/25/2007    Past Surgical History:  Procedure Laterality Date  . CHOLECYSTECTOMY N/A 06/30/2017   Procedure: LAPAROSCOPIC CHOLECYSTECTOMY WITH INTRAOPERATIVE CHOLANGIOGRAM;  Surgeon: Excell Seltzer, MD;  Location: [facility] ORS;  Service: General;  Laterality: N/A;  . ESOPHAGOGASTRODUODENOSCOPY (EGD) WITH PROPOFOL N/A 07/04/2017   Procedure: ESOPHAGOGASTRODUODENOSCOPY (EGD) WITH PROPOFOL;  Surgeon: Clarene Essex, MD;  Location: [facility] ENDOSCOPY;  Service: Endoscopy;  Laterality: N/A;  . KIDNEY TRANSPLANT  2007  . RENAL BIOPSY Bilateral 2003  . THYROID LOBECTOMY       OB History   None      Home Medications    Prior to Admission medications   Medication Sig Start Date End Date Taking? Authorizing Provider  ACCU-CHEK SOFTCLIX LANCETS lancets Use to check blood sugar 4 times per day. 12/29/15   Renato Shin, MD    acetaminophen (TYLENOL) 500 MG tablet Take 1,000 mg by mouth daily as needed for moderate pain.     [provider]  cetirizine (ZYRTEC) 10 MG tablet Take 10 mg by mouth daily.    [provider]  cyclobenzaprine (FLEXERIL) 10 MG tablet Take 1 tablet (10 mg total) by mouth 2 (two) times daily as needed for muscle spasms. 07/30/16   Montine Circle, PA-C  diazepam (VALIUM) 5 MG tablet Take 1 tablet (5 mg total) by mouth every 6 (six) hours as needed for anxiety (spasms). 05/11/15   Elnora Morrison, MD  epoetin alfa (EPOGEN,PROCRIT) 21194 UNIT/ML injection Inject 30,000 Units into the skin every 14 (fourteen) days.    [provider]  famotidine (PEPCID) 40 MG tablet Take 1 tablet (40 mg total) by mouth daily. 07/05/17   Patrecia Pour, Christean Grief, MD  fluticasone [facility]) 50 MCG/ACT nasal spray Place 2 sprays into both nostrils daily. 07/20/15   Muthersbaugh, Jarrett Soho, PA-C  furosemide (LASIX) 20 MG tablet Take 20 mg by mouth daily as needed. Feet swelling    [provider]  gabapentin (NEURONTIN) 100 MG capsule Take 200 mg by mouth daily as needed (for feet).  11/12/16   [provider]  hydrOXYzine (ATARAX/VISTARIL) 50 MG tablet Take 50 mg by mouth 2 (two) times daily as needed for itching.     [provider]  insulin aspart (NOVOLOG FLEXPEN) 100 UNIT/ML FlexPen Inject 15 Units into the skin 2 (two) times daily with a meal. 81-150=10 units 51-200=11 units 201-250=12 units 251-100=13 units 301-350=14 units >351=15 u Per sliding scale    [provider]  insulin degludec (TRESIBA FLEXTOUCH) 100 UNIT/ML SOPN FlexTouch Pen Inject 1.3 mLs (130 Units total) into the skin daily. And pen needles 1/day Patient taking differently: Inject 60 Units into the skin daily. And pen needles 1/day 07/22/16   Renato Shin, MD  loperamide (IMODIUM) 2 MG capsule Take 2 mg by mouth as needed for diarrhea or loose stools. Take 2 tablets as needed for diarrhea.     [provider]  morphine (MSIR) 15 MG tablet Take 1 tablet (15 mg total) by mouth every 4 (four) hours as needed for severe pain. 07/06/17   Deno Etienne, DO  mycophenolate (MYFORTIC) 360 MG TBEC Take 720 mg by mouth 2 (two) times daily.     [provider]  ondansetron (ZOFRAN ODT) 4 MG disintegrating tablet 4mg  ODT q4 hours prn nausea/vomit 07/06/17   Deno Etienne, DO  ondansetron (ZOFRAN) 4 MG tablet Take 1 tablet (4 mg total) by mouth every 8 (eight) hours as needed for nausea or vomiting. 06/28/17   Maczis, Barth Kirks, PA-C  oxyCODONE (OXY IR/ROXICODONE) 5 MG immediate release tablet Take 1 tablet (5 mg total) by mouth every 6 (six) hours as needed for severe pain. 07/01/17   [name], MD  predniSONE (DELTASONE) 5 MG tablet Take 5 mg by mouth at bedtime.     [provider]  promethazine (PHENERGAN) 25 MG suppository Place 1 suppository (25 mg total) rectally every 6 (six) hours as needed for nausea or vomiting.  07/06/17   Deno Etienne, DO  ranitidine (ZANTAC) 150 MG tablet Take 1 tablet (150 mg total) by mouth 2 (two) times daily. 07/06/17   Deno Etienne, DO  simvastatin (ZOCOR) 20 MG tablet Take 20 mg by mouth 3 (three) times a week. MWF.    [provider]  tacrolimus (PROGRAF) 1 MG capsule Take 2 mg by mouth 2 (two) times daily.     [provider]    Family History Family History  Problem Relation Age of Onset  . Arthritis Mother   . Diabetes Mother   . Hypertension Mother     Social History Social History   Tobacco Use  . Smoking status: Never Smoker  . Smokeless tobacco: Never Used  Substance Use Topics  . Alcohol use: No  . Drug use: No     Allergies   Benadryl [diphenhydramine-zinc acetate]; Motrin [ibuprofen]; Banana; Diphenhydramine hcl; Doxycycline; Iron dextran; and Shellfish allergy   Review of Systems Review of Systems  Constitutional: Negative for chills and fever.  HENT: Negative for congestion and rhinorrhea.   Eyes:  Negative for redness and visual disturbance.  Respiratory: Negative for shortness of breath and wheezing.   Cardiovascular: Negative for chest pain and palpitations.  Gastrointestinal: Positive for abdominal pain, nausea and vomiting.  Genitourinary: Negative for dysuria and urgency.  Musculoskeletal: Negative for arthralgias and myalgias.  Skin: Negative for pallor and wound.  Neurological: Negative for dizziness and headaches.     Physical Exam Updated Vital Signs BP (!) 186/125 (BP Location: Left Arm)   Pulse (!) 111   Temp 98.2 F (36.8 C) (Oral)   Resp 18   SpO2 98%   Physical Exam  Constitutional: She is oriented to person, place, and time. She appears well-developed and well-nourished. No distress.  HENT:  Head: Normocephalic and atraumatic.  Eyes: Pupils are equal, round, and reactive to light. EOM are normal.  Neck: Normal range of motion. Neck supple.  Cardiovascular: Normal rate and regular rhythm. Exam reveals no gallop and no friction rub.  No murmur heard. Pulmonary/Chest: Effort normal. She has no wheezes. She has no rales.  Abdominal: Soft. She exhibits no distension. There is tenderness (diffuse without focality).  Wounds post surgical without erythema, drainage  Musculoskeletal: She exhibits no edema or tenderness.  Neurological: She is alert and oriented to person, place, and time.  Skin: Skin is warm and dry. She is not diaphoretic.  Psychiatric: She has a normal mood and affect. Her behavior is normal.  Nursing note and vitals reviewed.    ED Treatments / Results  Labs (all labs ordered are listed, but only abnormal results are displayed) Labs Reviewed  CBC WITH DIFFERENTIAL/PLATELET - Abnormal; Notable for the following components:      Result Value   Hemoglobin 11.1 (*)    HCT 35.6 (*)    Neutro Abs 8.3 (*)    All other components within normal limits  COMPREHENSIVE METABOLIC PANEL - Abnormal; Notable for the following components:   Chloride 97  (*)    Glucose, Bld 257 (*)    BUN 22 (*)    Creatinine, Ser 1.49 (*)    Calcium 7.8 (*)    GFR calc non Af Amer 45 (*)    GFR calc Af Amer 52 (*)    All other components within normal limits  URINALYSIS, ROUTINE W REFLEX MICROSCOPIC - Abnormal; Notable for the following components:   Color, Urine STRAW (*)    Glucose, UA 50 (*)  Protein, ur >=300 (*)    All other components within normal limits  CBG MONITORING, ED - Abnormal; Notable for the following components:   Glucose-Capillary 229 (*)    All other components within normal limits  I-STAT CHEM 8, ED - Abnormal; Notable for the following components:   Chloride 95 (*)    BUN 21 (*)    Creatinine, Ser 1.40 (*)    Glucose, Bld 258 (*)    Calcium, Ion 0.92 (*)    Hemoglobin 11.9 (*)    HCT 35.0 (*)    All other components within normal limits  I-STAT BETA HCG BLOOD, ED ([facility], [facility], [facility] ONLY) - Abnormal; Notable for the following components:   I-stat hCG, quantitative 5.7 (*)    All other components within normal limits  LIPASE, BLOOD  TACROLIMUS LEVEL    EKG None  Radiology Ct Abdomen Pelvis Wo Contrast  Result Date: 07/06/2017 CLINICAL DATA:  Recent cholecystectomy, history of renal transplant, severe mid abdominal pain, nausea/vomiting EXAM: CT ABDOMEN AND PELVIS WITHOUT CONTRAST TECHNIQUE: Multidetector CT imaging of the abdomen and pelvis was performed following the standard protocol without IV contrast. COMPARISON:  07/02/2017 FINDINGS: Lower chest: Mild ground-glass opacity at the lung bases. Hepatobiliary: Unenhanced liver is unremarkable. Status post cholecystectomy. New complete resolution of postoperative seroma, with trace residual fluid inferior to the surgical bed (series 2/image 30). No intrahepatic or extrahepatic ductal dilatation. Pancreas: Within normal limits. Spleen: Within normal limits. Adrenals/Urinary Tract: Adrenal glands are within normal limits. Native left renal cortical scarring/atrophy. Native right  kidney is not discretely visualized. Right lower quadrant renal transplant with stable 3.1 cm cortical cyst (series 2/image 67). Moderate hydronephrosis involving the transplant kidney (series 2/image 60), new. Kidney is enlarged when compared to the prior. Bladder is mildly distended. Stomach/Bowel: Stomach is within normal limits. No evidence of bowel obstruction. Normal appendix (series 2/image 50). Vascular/Lymphatic: No evidence of abdominal aortic aneurysm. No suspicious abdominopelvic lymphadenopathy. Reproductive: Status post hysterectomy. No adnexal masses. Other: No abdominopelvic ascites.  No free air. Musculoskeletal: Visualized osseous structures are within normal limits. IMPRESSION: Moderate hydronephrosis involving the right lower quadrant renal transplant, new. Associated enlargement of the transplant kidney when compared to the prior. Emergent urology consult is suggested. Improving postoperative seroma related to prior cholecystectomy. Additional ancillary findings as above. Electronically Signed   By: Julian Hy M.D.   On: 07/06/2017 11:36    Procedures Procedures (including critical care time) Procedure note: Ultrasound Guided Peripheral IV Ultrasound guided peripheral 1.88 inch angiocath IV placement performed by me. Indications: Nursing unable to place IV. Details: The antecubital fossa and upper arm were evaluated with a multifrequency linear probe. Patent brachial veins were noted. 1 attempt was made to cannulate a vein under realtime US guidance with successful cannulation of the vein and catheter placement. There is return of non-pulsatile dark red blood. The patient tolerated the procedure well without complications. Images archived electronically.  CPT codes: [phone] and [phone]  Medications Ordered in ED Medications  lactated ringers bolus 2,000 mL (0 mLs Intravenous Stopped 07/06/17 1122)  HYDROmorphone (DILAUDID) injection 1 mg (1 mg Intravenous Given 07/06/17 0932)    prochlorperazine (COMPAZINE) injection 10 mg (10 mg Intravenous Given 07/06/17 0931)  famotidine (PEPCID) IVPB 20 mg premix (0 mg Intravenous Stopped 07/06/17 1122)  HYDROmorphone (DILAUDID) injection 0.5 mg (0.5 mg Intravenous Given 07/06/17 1010)  morphine (MSIR) tablet 15 mg (15 mg Oral Given 07/06/17 1548)     Initial Impression / Assessment and Plan / ED  Course  I have reviewed the triage vital signs and the nursing notes.  Pertinent labs & imaging results that were available during my care of the patient were reviewed by me and considered in my medical decision making (see chart for details).     32 yo F with a chief complaint of recurrent diffuse abdominal pain nausea and vomiting post cholecystectomy.  Patient was recently in the hospital and thought to have an upper GI bleed.  Had serial hemoglobins that were unremarkable and EGD that showed some significant food in the pylorus but no concerning signs of bleeding.  Her pain and nausea were controlled and she was discharged home.  She took a pain pill this morning and had recurrence of her diffuse abdominal pain and vomiting.  With the patient's continued symptoms we will repeat a CT scan give pain and nausea medicine check labs.  CT with new hydronephrosis since last CT about 4 days ago.  Discussed case with Dr. Alinda Money, urology he felt that this was a bladder outlet obstruction based on the CT scan.  He recommended that the Foley be placed and that she follow-up with her transplant doctor if her symptoms improve.  Patient was reassessed with continued improvement of symptoms.  Able to tolerate p.o.  We will have her follow-up with her transplant physicians as well as her PCP.  They felt that her pain medicine may be causing some of the symptoms and so I will change her oral pain medicine.  We will send her home with 2 types of antiemetics if needed.  Start on H2 blocker as she had some gastritis on endoscopy and this may be complicating her  story as well.  6:05 PM:  I have discussed the diagnosis/risks/treatment options with the patient and family and believe the pt to be eligible for discharge home to follow-up with PCP. We also discussed returning to the ED immediately if new or worsening sx occur. We discussed the sx which are most concerning (e.g., sudden worsening pain, fever, inability to tolerate by mouth) that necessitate immediate return. Medications administered to the patient during their visit and any new prescriptions provided to the patient are listed below.  Medications given during this visit Medications  lactated ringers bolus 2,000 mL (0 mLs Intravenous Stopped 07/06/17 1122)  HYDROmorphone (DILAUDID) injection 1 mg (1 mg Intravenous Given 07/06/17 0932)  prochlorperazine (COMPAZINE) injection 10 mg (10 mg Intravenous Given 07/06/17 0931)  famotidine (PEPCID) IVPB 20 mg premix (0 mg Intravenous Stopped 07/06/17 1122)  HYDROmorphone (DILAUDID) injection 0.5 mg (0.5 mg Intravenous Given 07/06/17 1010)  morphine (MSIR) tablet 15 mg (15 mg Oral Given 07/06/17 1548)      The patient appears reasonably screen and/or stabilized for discharge and I doubt any other medical condition or other [facility] requiring further screening, evaluation, or treatment in the ED at this time prior to discharge.    Final Clinical Impressions(s) / ED Diagnoses   Final diagnoses:  Generalized abdominal pain  Intractable vomiting with nausea, unspecified vomiting type  Hydronephrosis, unspecified hydronephrosis type    ED Discharge Orders        Ordered    ranitidine (ZANTAC) 150 MG tablet  2 times daily     07/06/17 1523    morphine (MSIR) 15 MG tablet  Every 4 hours PRN     07/06/17 1523    ondansetron (ZOFRAN ODT) 4 MG disintegrating tablet     07/06/17 1523    promethazine (PHENERGAN) 25 MG suppository  Every 6 hours PRN     07/06/17 [place], [place], DO 07/06/17 1805

## 2017-07-06 NOTE — Discharge Instructions (Signed)
Call your transplant doctor.  Call your PCP.  Return for worsening symptoms.

## 2017-07-07 ENCOUNTER — Ambulatory Visit (HOSPITAL_COMMUNITY)
Admission: RE | Admit: 2017-07-07 | Discharge: 2017-07-07 | Disposition: A | Discharge status: Home / Self Care | Payer: Medicare Other | Source: Ambulatory Visit | Attending: Nephrology | Admitting: Nephrology

## 2017-07-07 VITALS — BP 91/65 | HR 123 | Temp 99.0°F | Resp 18

## 2017-07-07 DIAGNOSIS — N182 Chronic kidney disease, stage 2 (mild): Secondary | ICD-10-CM | POA: Diagnosis not present

## 2017-07-07 LAB — POCT HEMOGLOBIN-HEMACUE: Hemoglobin: 11.5 g/dL — ABNORMAL LOW (ref 12.0–15.0)

## 2017-07-07 MED ORDER — EPOETIN ALFA 20000 UNIT/ML IJ SOLN
INTRAMUSCULAR | Status: AC
  Administered 2017-07-07: 20000 [IU] via SUBCUTANEOUS
  Filled 2017-07-07: qty 1

## 2017-07-07 MED ORDER — EPOETIN ALFA 10000 UNIT/ML IJ SOLN
INTRAMUSCULAR | Status: AC
  Administered 2017-07-07: 10000 [IU] via SUBCUTANEOUS
  Filled 2017-07-07: qty 1

## 2017-07-07 MED ORDER — EPOETIN ALFA 40000 UNIT/ML IJ SOLN: 30000.0000 [IU] | INTRAMUSCULAR | Status: DC

## 2017-07-08 LAB — TACROLIMUS LEVEL: TACROLIMUS (FK506) - LABCORP: 2.6 ng/mL (ref 2.0–20.0)

## 2017-07-11 ENCOUNTER — Other Ambulatory Visit: Payer: Self-pay

## 2017-07-11 NOTE — Patient Outreach (Signed)
[facility]) Care Management  07/11/2017  [name] [birth year] [id number]   Telephone Screen Referral Date : 07/11/2017 Referral Source: [facility] ED Census Referral Reason: Cleone Slim Insurance: Medicare/Medicaid  Outreach attempt #  1To patient.   Social: The patient states that she lives in the home with her brother.  He is very supportive.  She states that she is independent with her ADLS and Independent assist with her IADLS.  The patient is able to drive herself to her appointments.  She has a blood pressure cuff and CBG meter in the home.   Conditions: Per chart review the patient has HTN, Hyperparathyroid, and DM type 1.   Medications:The patient states that she is on about 6 medications in all.  She states that she is adherent with her medications and she is able to afford her medications.  Appointments: The patient's PCP is Dr Jeanie Cooks and she states she saw him 2 weeks ago.     Consent/ Services:  The patient was able to verify HIPAA. Discussed and offered [facility] care management services with patient.    The patient states that she does not need services at this time.  RN Health Coach advised the patient that I will send a pamphlet so that she may review the information and in the future if she needs services to give [place] a call,  Plan: Ashley will close the case due to patient assessed and no further intervention needed.   Lazaro Arms RN, BSN, [place] Direct Dial:  [phone] Fax: [phone]

## 2017-07-11 NOTE — Progress Notes (Signed)
This encounter was created in error - please disregard.

## 2017-07-13 ENCOUNTER — Emergency Department (HOSPITAL_COMMUNITY): Payer: Medicare Other

## 2017-07-13 ENCOUNTER — Other Ambulatory Visit: Payer: Self-pay

## 2017-07-13 ENCOUNTER — Encounter (HOSPITAL_COMMUNITY): Payer: Self-pay | Admitting: *Deleted

## 2017-07-13 ENCOUNTER — Inpatient Hospital Stay (HOSPITAL_COMMUNITY)
Admission: EM | Admit: 2017-07-13 | Discharge: 2017-07-15 | DRG: 683 | Disposition: A | Discharge status: Home / Self Care | Payer: Medicare Other | Attending: Internal Medicine | Admitting: Internal Medicine

## 2017-07-13 DIAGNOSIS — E1043 Type 1 diabetes mellitus with diabetic autonomic (poly)neuropathy: Secondary | ICD-10-CM | POA: Diagnosis not present

## 2017-07-13 DIAGNOSIS — I10 Essential (primary) hypertension: Secondary | ICD-10-CM | POA: Diagnosis present

## 2017-07-13 DIAGNOSIS — E1022 Type 1 diabetes mellitus with diabetic chronic kidney disease: Secondary | ICD-10-CM | POA: Diagnosis not present

## 2017-07-13 DIAGNOSIS — I129 Hypertensive chronic kidney disease with stage 1 through stage 4 chronic kidney disease, or unspecified chronic kidney disease: Secondary | ICD-10-CM | POA: Diagnosis present

## 2017-07-13 DIAGNOSIS — R109 Unspecified abdominal pain: Secondary | ICD-10-CM

## 2017-07-13 DIAGNOSIS — Z91018 Allergy to other foods: Secondary | ICD-10-CM

## 2017-07-13 DIAGNOSIS — Z87440 Personal history of urinary (tract) infections: Secondary | ICD-10-CM

## 2017-07-13 DIAGNOSIS — Z888 Allergy status to other drugs, medicaments and biological substances status: Secondary | ICD-10-CM

## 2017-07-13 DIAGNOSIS — E860 Dehydration: Secondary | ICD-10-CM | POA: Diagnosis present

## 2017-07-13 DIAGNOSIS — Z79899 Other long term (current) drug therapy: Secondary | ICD-10-CM

## 2017-07-13 DIAGNOSIS — K3184 Gastroparesis: Secondary | ICD-10-CM | POA: Diagnosis present

## 2017-07-13 DIAGNOSIS — R1084 Generalized abdominal pain: Secondary | ICD-10-CM | POA: Diagnosis not present

## 2017-07-13 DIAGNOSIS — K449 Diaphragmatic hernia without obstruction or gangrene: Secondary | ICD-10-CM | POA: Diagnosis present

## 2017-07-13 DIAGNOSIS — Z940 Kidney transplant status: Secondary | ICD-10-CM

## 2017-07-13 DIAGNOSIS — Z881 Allergy status to other antibiotic agents status: Secondary | ICD-10-CM

## 2017-07-13 DIAGNOSIS — E108 Type 1 diabetes mellitus with unspecified complications: Secondary | ICD-10-CM | POA: Diagnosis not present

## 2017-07-13 DIAGNOSIS — Z8249 Family history of ischemic heart disease and other diseases of the circulatory system: Secondary | ICD-10-CM

## 2017-07-13 DIAGNOSIS — T8619 Other complication of kidney transplant: Secondary | ICD-10-CM | POA: Diagnosis not present

## 2017-07-13 DIAGNOSIS — R112 Nausea with vomiting, unspecified: Secondary | ICD-10-CM | POA: Diagnosis present

## 2017-07-13 DIAGNOSIS — Z7951 Long term (current) use of inhaled steroids: Secondary | ICD-10-CM

## 2017-07-13 DIAGNOSIS — Q970 Karyotype 47, XXX: Secondary | ICD-10-CM

## 2017-07-13 DIAGNOSIS — N390 Urinary tract infection, site not specified: Secondary | ICD-10-CM | POA: Diagnosis present

## 2017-07-13 DIAGNOSIS — Z794 Long term (current) use of insulin: Secondary | ICD-10-CM

## 2017-07-13 DIAGNOSIS — Z9049 Acquired absence of other specified parts of digestive tract: Secondary | ICD-10-CM

## 2017-07-13 DIAGNOSIS — K219 Gastro-esophageal reflux disease without esophagitis: Secondary | ICD-10-CM | POA: Diagnosis present

## 2017-07-13 DIAGNOSIS — N179 Acute kidney failure, unspecified: Secondary | ICD-10-CM | POA: Diagnosis not present

## 2017-07-13 DIAGNOSIS — Z7952 Long term (current) use of systemic steroids: Secondary | ICD-10-CM

## 2017-07-13 DIAGNOSIS — Z833 Family history of diabetes mellitus: Secondary | ICD-10-CM

## 2017-07-13 DIAGNOSIS — K5900 Constipation, unspecified: Secondary | ICD-10-CM | POA: Diagnosis present

## 2017-07-13 DIAGNOSIS — K2970 Gastritis, unspecified, without bleeding: Secondary | ICD-10-CM | POA: Diagnosis present

## 2017-07-13 DIAGNOSIS — IMO0002 Reserved for concepts with insufficient information to code with codable children: Secondary | ICD-10-CM

## 2017-07-13 DIAGNOSIS — E1065 Type 1 diabetes mellitus with hyperglycemia: Secondary | ICD-10-CM | POA: Diagnosis present

## 2017-07-13 DIAGNOSIS — Y830 Surgical operation with transplant of whole organ as the cause of abnormal reaction of the patient, or of later complication, without mention of misadventure at the time of the procedure: Secondary | ICD-10-CM

## 2017-07-13 DIAGNOSIS — N189 Chronic kidney disease, unspecified: Secondary | ICD-10-CM | POA: Diagnosis present

## 2017-07-13 DIAGNOSIS — Z91013 Allergy to seafood: Secondary | ICD-10-CM

## 2017-07-13 DIAGNOSIS — N183 Chronic kidney disease, stage 3 (moderate): Secondary | ICD-10-CM | POA: Diagnosis present

## 2017-07-13 LAB — CBC
HCT: 33.9 % — ABNORMAL LOW (ref 36.0–46.0)
HEMOGLOBIN: 10.5 g/dL — AB (ref 12.0–15.0)
MCH: 27.8 pg (ref 26.0–34.0)
MCHC: 31 g/dL (ref 30.0–36.0)
MCV: 89.7 fL (ref 78.0–100.0)
Platelets: 392 10*3/uL (ref 150–400)
RBC: 3.78 MIL/uL — ABNORMAL LOW (ref 3.87–5.11)
RDW: 15 % (ref 11.5–15.5)
WBC: 13.2 10*3/uL — ABNORMAL HIGH (ref 4.0–10.5)

## 2017-07-13 LAB — COMPREHENSIVE METABOLIC PANEL
ALBUMIN: 3.8 g/dL (ref 3.5–5.0)
ALK PHOS: 66 U/L (ref 38–126)
ALT: 13 U/L — ABNORMAL LOW (ref 14–54)
ANION GAP: 14 (ref 5–15)
AST: 19 U/L (ref 15–41)
BUN: 35 mg/dL — ABNORMAL HIGH (ref 6–20)
CALCIUM: 7.4 mg/dL — AB (ref 8.9–10.3)
CO2: 20 mmol/L — AB (ref 22–32)
Chloride: 105 mmol/L (ref 101–111)
Creatinine, Ser: 2.4 mg/dL — ABNORMAL HIGH (ref 0.44–1.00)
GFR calc non Af Amer: 25 mL/min — ABNORMAL LOW (ref >60)
GFR, EST AFRICAN AMERICAN: 29 mL/min — AB (ref >60)
GLUCOSE: 249 mg/dL — AB (ref 65–99)
POTASSIUM: 4.5 mmol/L (ref 3.5–5.1)
SODIUM: 139 mmol/L (ref 135–145)
TOTAL PROTEIN: 8.2 g/dL — AB (ref 6.5–8.1)
Total Bilirubin: 0.5 mg/dL (ref 0.3–1.2)

## 2017-07-13 LAB — URINALYSIS, ROUTINE W REFLEX MICROSCOPIC
Bilirubin Urine: NEGATIVE
Glucose, UA: NEGATIVE mg/dL
KETONES UR: NEGATIVE mg/dL
Nitrite: NEGATIVE
PH: 5 (ref 5.0–8.0)
Protein, ur: 100 mg/dL — AB
SPECIFIC GRAVITY, URINE: 1.01 (ref 1.005–1.030)

## 2017-07-13 LAB — LACTIC ACID, PLASMA: Lactic Acid, Venous: 1.2 mmol/L (ref 0.5–1.9)

## 2017-07-13 LAB — LIPASE, BLOOD: Lipase: 42 U/L (ref 11–51)

## 2017-07-13 LAB — TROPONIN I: Troponin I: <0.03 ng/mL (ref <0.03)

## 2017-07-13 LAB — GLUCOSE, CAPILLARY
GLUCOSE-CAPILLARY: 337 mg/dL — AB (ref 65–99)
Glucose-Capillary: 343 mg/dL — ABNORMAL HIGH (ref 65–99)

## 2017-07-13 MED ORDER — ONDANSETRON HCL 4 MG PO TABS: 4.0000 mg | ORAL_TABLET | Freq: Four times a day (QID) | ORAL | Status: DC | PRN

## 2017-07-13 MED ORDER — DOCUSATE SODIUM 100 MG PO CAPS
100.0000 mg | ORAL_CAPSULE | Freq: Two times a day (BID) | ORAL | Status: DC
  Administered 2017-07-14 – 2017-07-15 (×3): 100 mg via ORAL
  Filled 2017-07-13 (×5): qty 1

## 2017-07-13 MED ORDER — DIAZEPAM 5 MG PO TABS: 5.0000 mg | ORAL_TABLET | Freq: Four times a day (QID) | ORAL | Status: DC | PRN

## 2017-07-13 MED ORDER — ONDANSETRON HCL 4 MG/2ML IJ SOLN
4.0000 mg | Freq: Once | INTRAMUSCULAR | Status: AC
  Administered 2017-07-13: 4 mg via INTRAVENOUS
  Filled 2017-07-13: qty 2

## 2017-07-13 MED ORDER — INSULIN GLARGINE 100 UNIT/ML ~~LOC~~ SOLN
50.0000 [IU] | Freq: Every day | SUBCUTANEOUS | Status: DC
  Administered 2017-07-13 – 2017-07-14 (×2): 50 [IU] via SUBCUTANEOUS
  Filled 2017-07-13 (×3): qty 0.5

## 2017-07-13 MED ORDER — SODIUM CHLORIDE 0.9 % IV SOLN
INTRAVENOUS | Status: DC
  Administered 2017-07-13 – 2017-07-15 (×3): via INTRAVENOUS

## 2017-07-13 MED ORDER — SODIUM CHLORIDE 0.9 % IV SOLN
1.0000 g | INTRAVENOUS | Status: DC
  Administered 2017-07-13 – 2017-07-14 (×2): 1 g via INTRAVENOUS
  Filled 2017-07-13 (×2): qty 1

## 2017-07-13 MED ORDER — INSULIN ASPART 100 UNIT/ML ~~LOC~~ SOLN
0.0000 [IU] | Freq: Every day | SUBCUTANEOUS | Status: DC
  Administered 2017-07-13: 5 [IU] via SUBCUTANEOUS
  Administered 2017-07-14: 2 [IU] via SUBCUTANEOUS

## 2017-07-13 MED ORDER — SODIUM CHLORIDE 0.9% FLUSH
3.0000 mL | Freq: Two times a day (BID) | INTRAVENOUS | Status: DC
  Administered 2017-07-13 – 2017-07-15 (×3): 3 mL via INTRAVENOUS

## 2017-07-13 MED ORDER — HYDRALAZINE HCL 20 MG/ML IJ SOLN: 10.0000 mg | Freq: Four times a day (QID) | INTRAMUSCULAR | Status: DC | PRN

## 2017-07-13 MED ORDER — ONDANSETRON HCL 4 MG/2ML IJ SOLN
4.0000 mg | Freq: Four times a day (QID) | INTRAMUSCULAR | Status: DC | PRN
  Administered 2017-07-13 – 2017-07-14 (×3): 4 mg via INTRAVENOUS
  Filled 2017-07-13 (×3): qty 2

## 2017-07-13 MED ORDER — INSULIN ASPART 100 UNIT/ML ~~LOC~~ SOLN
0.0000 [IU] | Freq: Three times a day (TID) | SUBCUTANEOUS | Status: DC
  Administered 2017-07-13: 7 [IU] via SUBCUTANEOUS
  Administered 2017-07-14: 2 [IU] via SUBCUTANEOUS
  Administered 2017-07-14: 3 [IU] via SUBCUTANEOUS
  Administered 2017-07-15 (×2): 2 [IU] via SUBCUTANEOUS

## 2017-07-13 MED ORDER — SODIUM CHLORIDE 0.9 % IV BOLUS
500.0000 mL | Freq: Once | INTRAVENOUS | Status: AC
  Administered 2017-07-13: 500 mL via INTRAVENOUS

## 2017-07-13 MED ORDER — LACTULOSE 10 GM/15ML PO SOLN
10.0000 g | ORAL | Status: AC
  Administered 2017-07-14: 10 g via ORAL
  Filled 2017-07-13 (×3): qty 15

## 2017-07-13 MED ORDER — LACTATED RINGERS IV BOLUS
1000.0000 mL | Freq: Once | INTRAVENOUS | Status: AC
  Administered 2017-07-13: 1000 mL via INTRAVENOUS

## 2017-07-13 MED ORDER — HYDRALAZINE HCL 20 MG/ML IJ SOLN
10.0000 mg | Freq: Once | INTRAMUSCULAR | Status: AC
  Administered 2017-07-13: 10 mg via INTRAVENOUS
  Filled 2017-07-13: qty 1

## 2017-07-13 MED ORDER — MORPHINE SULFATE (PF) 4 MG/ML IV SOLN
2.0000 mg | INTRAVENOUS | Status: DC | PRN
  Administered 2017-07-13 – 2017-07-15 (×4): 2 mg via INTRAVENOUS
  Filled 2017-07-13 (×5): qty 1

## 2017-07-13 MED ORDER — TACROLIMUS 1 MG PO CAPS
2.0000 mg | ORAL_CAPSULE | Freq: Two times a day (BID) | ORAL | Status: DC
  Administered 2017-07-14 – 2017-07-15 (×3): 2 mg via ORAL
  Filled 2017-07-13 (×4): qty 2

## 2017-07-13 MED ORDER — METHYLPREDNISOLONE SODIUM SUCC 40 MG IJ SOLR
10.0000 mg | INTRAMUSCULAR | Status: DC
  Administered 2017-07-13: 10 mg via INTRAVENOUS
  Filled 2017-07-13: qty 1

## 2017-07-13 MED ORDER — KETAMINE HCL 50 MG/5ML IJ SOSY: 0.3000 mg/kg | PREFILLED_SYRINGE | Freq: Once | INTRAMUSCULAR | Status: DC

## 2017-07-13 MED ORDER — ENOXAPARIN SODIUM 30 MG/0.3ML ~~LOC~~ SOLN
30.0000 mg | SUBCUTANEOUS | Status: DC
  Administered 2017-07-13: 30 mg via SUBCUTANEOUS
  Filled 2017-07-13: qty 0.3

## 2017-07-13 MED ORDER — ACETAMINOPHEN 500 MG PO TABS: 1000.0000 mg | ORAL_TABLET | Freq: Every day | ORAL | Status: DC | PRN

## 2017-07-13 MED ORDER — HALOPERIDOL LACTATE 5 MG/ML IJ SOLN
5.0000 mg | Freq: Once | INTRAMUSCULAR | Status: AC
  Administered 2017-07-13: 5 mg via INTRAVENOUS
  Filled 2017-07-13: qty 1

## 2017-07-13 MED ORDER — MORPHINE SULFATE 15 MG PO TABS
15.0000 mg | ORAL_TABLET | ORAL | Status: DC | PRN
  Filled 2017-07-13: qty 1

## 2017-07-13 MED ORDER — MYCOPHENOLATE SODIUM 180 MG PO TBEC
720.0000 mg | DELAYED_RELEASE_TABLET | Freq: Two times a day (BID) | ORAL | Status: DC
  Administered 2017-07-14 – 2017-07-15 (×3): 720 mg via ORAL
  Filled 2017-07-13 (×4): qty 4

## 2017-07-13 MED ORDER — MORPHINE SULFATE (PF) 4 MG/ML IV SOLN
4.0000 mg | Freq: Once | INTRAVENOUS | Status: AC
  Administered 2017-07-13: 4 mg via INTRAVENOUS
  Filled 2017-07-13: qty 1

## 2017-07-13 NOTE — ED Notes (Signed)
ED TO INPATIENT HANDOFF REPORT  Name/Age/Gender [name] 32 y.o. female  Code Status    Code Status Orders  (From admission, onward)        Start     Ordered   07/13/17 1325  Full code  Continuous     07/13/17 1328    Code Status History    Date Active Date Inactive Code Status Order ID Comments User Context   07/03/2017 0058 07/05/2017 1601 Full Code [id number]  Toy Baker, MD ED   06/29/2017 2113 07/01/2017 1423 Full Code [id number]  Johnathan Hausen, MD Inpatient   12/11/2016 2212 12/18/2016 0036 Full Code [id number]  Vianne Bulls, MD ED   01/09/2015 1655 01/12/2015 2123 Full Code [id number]  Willia Craze, NP Inpatient   01/05/2015 1855 01/07/2015 1530 Full Code [id number]  Florencia Reasons, MD Inpatient   09/30/2014 1313 10/01/2014 2243 Full Code [id number]  Truett Mainland, DO Inpatient   06/24/2014 0031 06/25/2014 2337 Full Code [id number]  Rise Patience, MD Inpatient   12/22/2012 2251 12/23/2012 2223 Full Code [id number]  Toy Baker, MD Inpatient      Home/SNF/Other Home  Chief Complaint stomach pain, gb surg  Level of Care/Admitting Diagnosis ED Disposition    ED Disposition Condition Comment   Admit  Hospital Area: [facility] [[id number]]  Level of Care: Telemetry [5]  Admit to tele based on following criteria: Complex arrhythmia (Bradycardia/Tachycardia)  Diagnosis: AKI (acute kidney injury) [facility]) [[id number]]  Admitting Physician: Junius Argyle  Attending Physician: Annita Brod [[id number]]  PT Class (Do Not Modify): Observation [104]  PT Acc Code (Do Not Modify): Observation [10022]       Medical History Past Medical History:  Diagnosis Date  . Chronic kidney disease   . Diabetes mellitus    Pt reports diagnosis in June 2011  . Hyperlipidemia   . Hypertension   . Kidney transplant recipient   . 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  Pyelonephritis 06/23/2014  . UTI (urinary tract infection) 01/09/2015  . 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 Benadryl [Diphenhydramine-Zinc Acetate] Shortness Of Breath  . Motrin [Ibuprofen] Shortness Of Breath and Itching    Per pt  . Banana Other (See Comments)    Sick on the stomach  . Diphenhydramine Hcl     REACTION: Stopped breathing in ICU  . Doxycycline     Shortness of Breath   . Iron Dextran     REACTION: vein irritation  . Shellfish Allergy Hives    IV Location/Drains/Wounds Patient Lines/Drains/Airways Status   Active Line/Drains/Airways    Name:   Placement date:   Placement time:   Site:   Days:   Peripheral IV 07/13/17 Right Forearm   07/13/17    1019    Forearm   less than 1   Urethral Catheter Logan Vaughn,RN Double-lumen 14 Fr.   07/06/17    1357    Double-lumen   7   AIRWAYS   07/04/17    1217     9   AIRWAYS   07/04/17    1217     9   Incision (Closed) 06/30/17 Abdomen Other (Comment)   06/30/17    1315     13   Incision - 3 Ports Abdomen Umbilicus  Upper;Left Right;Lateral   06/30/17    1145     13          Labs/Imaging Results for orders placed or performed during the hospital encounter of 07/13/17 (from the past 48 hour(s))  CBC     Status: Abnormal   Collection Time: 07/13/17 10:05 AM  Result Value Ref Range   WBC 13.2 (H) 4.0 - 10.5 K/uL   RBC 3.78 (L) 3.87 - 5.11 MIL/uL   Hemoglobin 10.5 (L) 12.0 - 15.0 g/dL   HCT 33.9 (L) 36.0 - 46.0 %   MCV 89.7 78.0 - 100.0 fL   MCH 27.8 26.0 - 34.0 pg   MCHC 31.0 30.0 - 36.0 g/dL   RDW 15.0 11.5 - 15.5 %   Platelets 392 150 - 400 K/uL    Comment: Performed at [facility], [place] [address] [place], [place] [address]  Comprehensive metabolic panel     Status: Abnormal   Collection Time: 07/13/17 10:05 AM  Result Value Ref Range   Sodium 139 135 - 145 mmol/L   Potassium 4.5 3.5 - 5.1 mmol/L    Chloride 105 101 - 111 mmol/L   CO2 20 (L) 22 - 32 mmol/L   Glucose, Bld 249 (H) 65 - 99 mg/dL   BUN 35 (H) 6 - 20 mg/dL   Creatinine, Ser 2.40 (H) 0.44 - 1.00 mg/dL   Calcium 7.4 (L) 8.9 - 10.3 mg/dL   Total Protein 8.2 (H) 6.5 - 8.1 g/dL   Albumin 3.8 3.5 - 5.0 g/dL   AST 19 15 - 41 U/L   ALT 13 (L) 14 - 54 U/L   Alkaline Phosphatase 66 38 - 126 U/L   Total Bilirubin 0.5 0.3 - 1.2 mg/dL   GFR calc non Af Amer 25 (L) >60 mL/min   GFR calc Af Amer 29 (L) >60 mL/min    Comment: (NOTE) The eGFR has been calculated using the CKD EPI equation. This calculation has not been validated in all clinical situations. eGFR's persistently <60 mL/min signify possible Chronic Kidney Disease.    Anion gap 14 5 - 15    Comment: Performed at [facility], [place] [address], [place], [place] [address]  Lipase, blood     Status: None   Collection Time: 07/13/17 10:05 AM  Result Value Ref Range   Lipase 42 11 - 51 U/L    Comment: Performed at [facility], [place] [address], [place], [place] [address]  Urinalysis, Routine w reflex microscopic     Status: Abnormal   Collection Time: 07/13/17 10:07 AM  Result Value Ref Range   Color, Urine STRAW (A) YELLOW   APPearance CLOUDY (A) CLEAR   Specific Gravity, Urine 1.010 1.005 - 1.030   pH 5.0 5.0 - 8.0   Glucose, UA NEGATIVE NEGATIVE mg/dL   Hgb urine dipstick SMALL (A) NEGATIVE   Bilirubin Urine NEGATIVE NEGATIVE   Ketones, ur NEGATIVE NEGATIVE mg/dL   Protein, ur 100 (A) NEGATIVE mg/dL   Nitrite NEGATIVE NEGATIVE   Leukocytes, UA MODERATE (A) NEGATIVE   RBC / HPF 6-10 0 - 5 RBC/hpf   WBC, UA 21-50 0 - 5 WBC/hpf   Bacteria, UA MANY (A) NONE SEEN   Mucus PRESENT     Comment: Performed at [facility], [place] [address], [place], [place] [address]   Dg Abd Portable 1v  Result Date: 07/13/2017 CLINICAL DATA:  Abdominal pain with nausea and vomiting EXAM: PORTABLE ABDOMEN -  1 VIEW COMPARISON:  CT  abdomen and pelvis July 06, 2017 FINDINGS: There is moderate stool in the colon. There is no appreciable bowel dilatation or air-fluid level to suggest bowel obstruction. No free air. There are surgical clips in the right quadrant and right medial pelvis. IMPRESSION: No bowel obstruction or free air.  Moderate stool in colon. Electronically Signed   By: Lowella Grip III M.D.   On: 07/13/2017 13:05    Pending Labs Unresulted Labs (From admission, onward)   Start     Ordered   07/20/17 0500  Creatinine, serum  (enoxaparin (LOVENOX)    CrCl < 30 ml/min)  Weekly,   R    Comments:  while on enoxaparin therapy.    07/13/17 1328   07/14/17 7290  Basic metabolic panel  Tomorrow morning,   R     07/13/17 1328   07/14/17 0500  CBC  Tomorrow morning,   R     07/13/17 1328   07/13/17 1244  Lactic acid, plasma  STAT,   STAT     07/13/17 1243   07/13/17 1244  Troponin I  STAT,   R     07/13/17 1243   07/13/17 1112  Urine culture  STAT,   STAT     07/13/17 1111      Vitals/Pain Today's Vitals   07/13/17 1200 07/13/17 1230 07/13/17 1234 07/13/17 1300  BP: (!) 164/109 (!) 173/110 (!) 173/108 (!) 167/109  Pulse: (!) 105 (!) 104 (!) 107 (!) 105  Resp:   20   Temp:      TempSrc:      SpO2: 94% 99% 100% 100%  PainSc:        Isolation Precautions No active isolations  Medications Medications  lactated ringers bolus 1,000 mL (1,000 mLs Intravenous New Bag/Given 07/13/17 1233)  hydrALAZINE (APRESOLINE) injection 10 mg (has no administration in time range)  acetaminophen (TYLENOL) tablet 1,000 mg (has no administration in time range)  diazepam (VALIUM) tablet 5 mg (has no administration in time range)  morphine (MSIR) tablet 15 mg (has no administration in time range)  tacrolimus (PROGRAF) capsule 2 mg (has no administration in time range)  mycophenolate (MYFORTIC) EC tablet 720 mg (has no administration in time range)  enoxaparin (LOVENOX) injection 30 mg (has no administration in time  range)  sodium chloride flush (NS) 0.9 % injection 3 mL (has no administration in time range)  hydrALAZINE (APRESOLINE) injection 10 mg (has no administration in time range)  cefTRIAXone (ROCEPHIN) 1 g in sodium chloride 0.9 % 100 mL IVPB (has no administration in time range)  0.9 %  sodium chloride infusion (has no administration in time range)  ondansetron (ZOFRAN) tablet 4 mg (has no administration in time range)    Or  ondansetron (ZOFRAN) injection 4 mg (has no administration in time range)  insulin aspart (novoLOG) injection 0-9 Units (has no administration in time range)  insulin aspart (novoLOG) injection 0-5 Units (has no administration in time range)  sodium chloride 0.9 % bolus 500 mL (0 mLs Intravenous Stopped 07/13/17 1134)  morphine 4 MG/ML injection 4 mg (4 mg Intravenous Given 07/13/17 1020)  ondansetron (ZOFRAN) injection 4 mg (4 mg Intravenous Given 07/13/17 1020)  haloperidol lactate (HALDOL) injection 5 mg (5 mg Intravenous Given 07/13/17 1233)    Mobility walks

## 2017-07-13 NOTE — Progress Notes (Signed)
Pt admitted to room 1421 from ED. Pt A&O with delayed responses at times, does not answer staff questions at times. States she is "just tired." Haldol given in ED. C/o N/V and did vomit a small amount in emesis bag. IV Zofran given and effective. Pt states she still cannot take any PO meds at this time. MD paged to be made aware of this & BP. BP 150/105 before IV Hydralazine given- 155/102 with HR 114 after med given. Pt sleeping in bed at this time, no signs of distress. Tremors to BUE at times- pt states this is not new and she is "cold." Afebrile. Room temperature increased. Will continue to monitor closely.

## 2017-07-13 NOTE — ED Provider Notes (Signed)
[facility] DEPT Provider Note   CSN: [id number] Arrival date & time: 07/13/17  0849     History   Chief Complaint Chief Complaint  Patient presents with  . Abdominal Pain    HPI [name] is a 32 y.o. female.  HPI  [name] is a 32yo female with a history of right renal transplant on Myfortic, prednisone and prograf, T1DM, XXX syndrome who presents to the emergency department for evaluation of generalized abdominal pain and n/v. Of note she recently had a cholecystectomy procedure 4/18 performed by Dr. Excell Seltzer which was complicated by generalized abdominal pain, coffee ground emesis where she was admitted and had an endoscopy which showed small hiatal hernia and large amount of food residue. She was subsequently discharged, seen again for abdominal pain and n/v in the ED where she was found to have hydronephrosis in her transplant kidney and foley catheter placed 4/24.  Patient reports she ran out of morphine yesterday. States this morning she woke up with generalized sharp abdominal pain which is 7/10 in severity and comes and goes without known trigger. Reports that she has had about 6 episodes of non-bloody emesis despite taking ODT zofran at home. She states that she is scheduled to see her Urologist Dr. Lorrene Reid tomorrow to determine if she can have her foley removed. Patient states that she has had a few episodes of non-bloody diarrhea, but this is chronic for her and unchanged from baseline. She denies fever, chills, coffee-ground emesis, hematochezia, melena, vaginal discharge, chest pain, sob, lightheadedness. No other abdominal surgeries other than kidney transplant and cholecystectomy.    Past Medical History:  Diagnosis Date  . Chronic kidney disease   . Diabetes mellitus    Pt reports diagnosis in June 2011  . Hyperlipidemia   . Hypertension   . Kidney transplant recipient   . LEARNING DISABILITY 09/25/2007   Qualifier: Diagnosis of  By:  Deborra Medina MD, Tanja Port    . 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 Pyelonephritis 06/23/2014  . UTI (urinary tract infection) 01/09/2015  . 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 Hematemesis 07/02/2017  . Dehydration 07/02/2017  . Chronic cholecystitis 06/29/2017  . Type 1 diabetes mellitus with complication, uncontrolled ([place]) 05/25/2015  . Intractable nausea and vomiting 01/09/2015  . Renal transplant recipient   . Immunosuppressed status ([place])   . 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 CHRONIC KIDNEY DISEASE STAGE II (MILD) 01/04/2010  . OVARIAN FAILURE, PREMATURE 03/11/2009  . 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 SECONDARY HYPERPARATHYROIDISM 12/05/2007  . OBESITY 09/25/2007  . Anemia in chronic renal disease 09/25/2007  . LEARNING DISABILITY 09/25/2007  . Essential hypertension, benign 09/25/2007  . KIDNEY TRANSPLANTATION, HX OF 09/25/2007    Past Surgical History:  Procedure Laterality Date  . CHOLECYSTECTOMY N/A 06/30/2017   Procedure: LAPAROSCOPIC CHOLECYSTECTOMY WITH INTRAOPERATIVE CHOLANGIOGRAM;  Surgeon: Excell Seltzer, MD;  Location: [facility] ORS;  Service: General;  Laterality: N/A;  . ESOPHAGOGASTRODUODENOSCOPY (EGD) WITH PROPOFOL N/A 07/04/2017   Procedure: ESOPHAGOGASTRODUODENOSCOPY (EGD) WITH PROPOFOL;  Surgeon: Clarene Essex, MD;  Location: [facility] ENDOSCOPY;  Service: Endoscopy;  Laterality: N/A;  . KIDNEY TRANSPLANT  2007  . RENAL BIOPSY Bilateral 2003  . THYROID LOBECTOMY       OB History   None      Home Medications    Prior to Admission medications   Medication  Sig Start Date End Date Taking? Authorizing Provider  ACCU-CHEK SOFTCLIX LANCETS lancets Use to check blood sugar 4 times per day. 12/29/15   Renato Shin, MD  acetaminophen (TYLENOL) 500 MG tablet Take 1,000 mg by mouth daily as needed for moderate pain.     [provider]  cetirizine  (ZYRTEC) 10 MG tablet Take 10 mg by mouth daily.    [provider]  cyclobenzaprine (FLEXERIL) 10 MG tablet Take 1 tablet (10 mg total) by mouth 2 (two) times daily as needed for muscle spasms. 07/30/16   Montine Circle, PA-C  diazepam (VALIUM) 5 MG tablet Take 1 tablet (5 mg total) by mouth every 6 (six) hours as needed for anxiety (spasms). 05/11/15   Elnora Morrison, MD  epoetin alfa (EPOGEN,PROCRIT) 81275 UNIT/ML injection Inject 30,000 Units into the skin every 14 (fourteen) days.    [provider]  famotidine (PEPCID) 40 MG tablet Take 1 tablet (40 mg total) by mouth daily. 07/05/17   Patrecia Pour, Christean Grief, MD  fluticasone [facility]) 50 MCG/ACT nasal spray Place 2 sprays into both nostrils daily. 07/20/15   Muthersbaugh, Jarrett Soho, PA-C  furosemide (LASIX) 20 MG tablet Take 20 mg by mouth daily as needed. Feet swelling    [provider]  gabapentin (NEURONTIN) 100 MG capsule Take 200 mg by mouth daily as needed (for feet).  11/12/16   [provider]  hydrOXYzine (ATARAX/VISTARIL) 50 MG tablet Take 50 mg by mouth 2 (two) times daily as needed for itching.     [provider]  insulin aspart (NOVOLOG FLEXPEN) 100 UNIT/ML FlexPen Inject 15 Units into the skin 2 (two) times daily with a meal. 81-150=10 units 51-200=11 units 201-250=12 units 251-100=13 units 301-350=14 units >351=15 u Per sliding scale    [provider]  insulin degludec (TRESIBA FLEXTOUCH) 100 UNIT/ML SOPN FlexTouch Pen Inject 1.3 mLs (130 Units total) into the skin daily. And pen needles 1/day Patient taking differently: Inject 60 Units into the skin daily. And pen needles 1/day 07/22/16   Renato Shin, MD  loperamide (IMODIUM) 2 MG capsule Take 2 mg by mouth as needed for diarrhea or loose stools. Take 2 tablets as needed for diarrhea.    [provider]  morphine (MSIR) 15 MG tablet Take 1 tablet (15 mg total) by mouth every 4 (four) hours as needed for severe pain.  07/06/17   Deno Etienne, DO  mycophenolate (MYFORTIC) 360 MG TBEC Take 720 mg by mouth 2 (two) times daily.     [provider]  ondansetron (ZOFRAN ODT) 4 MG disintegrating tablet 4mg  ODT q4 hours prn nausea/vomit 07/06/17   Deno Etienne, DO  ondansetron (ZOFRAN) 4 MG tablet Take 1 tablet (4 mg total) by mouth every 8 (eight) hours as needed for nausea or vomiting. 06/28/17   Maczis, Barth Kirks, PA-C  oxyCODONE (OXY IR/ROXICODONE) 5 MG immediate release tablet Take 1 tablet (5 mg total) by mouth every 6 (six) hours as needed for severe pain. 07/01/17   [name], MD  predniSONE (DELTASONE) 5 MG tablet Take 5 mg by mouth at bedtime.     [provider]  promethazine (PHENERGAN) 25 MG suppository Place 1 suppository (25 mg total) rectally every 6 (six) hours as needed for nausea or vomiting. 07/06/17   Deno Etienne, DO  ranitidine (ZANTAC) 150 MG tablet Take 1 tablet (150 mg total) by mouth 2 (two) times daily. 07/06/17   Deno Etienne, DO  simvastatin (ZOCOR) 20 MG tablet Take 20 mg by  mouth 3 (three) times a week. MWF.    [provider]  tacrolimus (PROGRAF) 1 MG capsule Take 2 mg by mouth 2 (two) times daily.     [provider]    Family History Family History  Problem Relation Age of Onset  . Arthritis Mother   . Diabetes Mother   . Hypertension Mother     Social History Social History   Tobacco Use  . Smoking status: Never Smoker  . Smokeless tobacco: Never Used  Substance Use Topics  . Alcohol use: No  . Drug use: No     Allergies   Benadryl [diphenhydramine-zinc acetate]; Motrin [ibuprofen]; Banana; Diphenhydramine hcl; Doxycycline; Iron dextran; and Shellfish allergy   Review of Systems Review of Systems  Constitutional: Negative for chills and fever.  Eyes: Negative for visual disturbance.  Respiratory: Negative for shortness of breath.   Cardiovascular: Negative for chest pain.  Gastrointestinal: Positive for abdominal pain, diarrhea,  nausea and vomiting. Negative for blood in stool.  Genitourinary: Negative for difficulty urinating.  Musculoskeletal: Negative for back pain.  Skin: Negative for rash.  Neurological: Negative for syncope and light-headedness.  Psychiatric/Behavioral: Negative for agitation.     Physical Exam Updated Vital Signs BP (!) 157/108   Pulse (!) 103   Temp 98.4 F (36.9 C) (Oral)   Resp 18   SpO2 97%   Physical Exam  Constitutional: She is oriented to person, place, and time. She appears well-developed and well-nourished. No distress.  Patient crying out in pain, obvious discomfort. Vomiting.   HENT:  Head: Normocephalic and atraumatic.  Mouth/Throat: Oropharynx is clear and moist. No oropharyngeal exudate.  Eyes: Pupils are equal, round, and reactive to light. Conjunctivae are normal. Right eye exhibits no discharge. Left eye exhibits no discharge.  Neck: Normal range of motion. Neck supple.  Cardiovascular: Normal rate, regular rhythm and intact distal pulses. Exam reveals no friction rub.  No murmur heard. Pulmonary/Chest: Effort normal and breath sounds normal. No stridor. No respiratory distress. She has no wheezes. She has no rales.  Abdominal:  Large scar from midline abdominal incision. Several small incisions which are in early stages of healing. No purulence, erythema or induration surrounding. Patient generally tender to palpation over the entire abdomen, no focal tenderness. No rebound tenderness, guarding or rigidity.   Musculoskeletal: Normal range of motion.  Neurological: She is alert and oriented to person, place, and time. Coordination normal.  Skin: Skin is warm and dry. Capillary refill takes less than 2 seconds. She is not diaphoretic.  Psychiatric: She has a normal mood and affect. Her behavior is normal.  Nursing note and vitals reviewed.    ED Treatments / Results  Labs (all labs ordered are listed, but only abnormal results are displayed) Labs Reviewed    CBC - Abnormal; Notable for the following components:      Result Value   WBC 13.2 (*)    RBC 3.78 (*)    Hemoglobin 10.5 (*)    HCT 33.9 (*)    All other components within normal limits  COMPREHENSIVE METABOLIC PANEL - Abnormal; Notable for the following components:   CO2 20 (*)    Glucose, Bld 249 (*)    BUN 35 (*)    Creatinine, Ser 2.40 (*)    Calcium 7.4 (*)    Total Protein 8.2 (*)    ALT 13 (*)    GFR calc non Af Amer 25 (*)    GFR calc Af Amer 29 (*)  All other components within normal limits  URINALYSIS, ROUTINE W REFLEX MICROSCOPIC - Abnormal; Notable for the following components:   Color, Urine STRAW (*)    APPearance CLOUDY (*)    Hgb urine dipstick SMALL (*)    Protein, ur 100 (*)    Leukocytes, UA MODERATE (*)    Bacteria, UA MANY (*)    All other components within normal limits  GLUCOSE, CAPILLARY - Abnormal; Notable for the following components:   Glucose-Capillary 343 (*)    All other components within normal limits  URINE CULTURE  LIPASE, BLOOD  LACTIC ACID, PLASMA  TROPONIN I    EKG None  Radiology Dg Abd Portable 1v  Result Date: 07/13/2017 CLINICAL DATA:  Abdominal pain with nausea and vomiting EXAM: PORTABLE ABDOMEN - 1 VIEW COMPARISON:  CT abdomen and pelvis July 06, 2017 FINDINGS: There is moderate stool in the colon. There is no appreciable bowel dilatation or air-fluid level to suggest bowel obstruction. No free air. There are surgical clips in the right quadrant and right medial pelvis. IMPRESSION: No bowel obstruction or free air.  Moderate stool in colon. Electronically Signed   By: Lowella Grip III M.D.   On: 07/13/2017 13:05    Procedures Procedures (including critical care time)  Medications Ordered in ED Medications  haloperidol lactate (HALDOL) injection 5 mg (has no administration in time range)  lactated ringers bolus 1,000 mL (has no administration in time range)  sodium chloride 0.9 % bolus 500 mL (0 mLs Intravenous  Stopped 07/13/17 1134)  morphine 4 MG/ML injection 4 mg (4 mg Intravenous Given 07/13/17 1020)  ondansetron (ZOFRAN) injection 4 mg (4 mg Intravenous Given 07/13/17 1020)     Initial Impression / Assessment and Plan / ED Course  I have reviewed the triage vital signs and the nursing notes.  Pertinent labs & imaging results that were available during my care of the patient were reviewed by me and considered in my medical decision making (see chart for details).    Patient with a history of renal transplant on chronic immunosuppression, XXX syndrome and recent cholecystectomy procedure complicated by generalized abdominal pain and nausea/vomiting presents to the emergency department for evaluation of generalized abdominal pain and nausea/vomiting.  On exam she is afebrile and tachycardic.  She is actively vomiting.  Tender to palpation generally over her abdomen, without specific focal tenderness.  Initial labs reviewed.  CMP reveals that she has an AKI with creatinine 2.4 versus creatinine 1.49 a week ago.  Fluid bolus started.  CBC reveals mild leukocytosis of 13.2.  Her UA shows evidence of infection with many bacteria and WBCs, although this may be contamination given UA sample was taken off of her Foley catheter.  Lipase negative.  Discussed this patient with Dr. Kathrynn Humble who also evaluated the patient and agrees with admission for AKI and ongoing pain from her surgery.  Suspect that her pain may be related to gastroparesis given her recent EGD which showed food in the stomach, Haldol given as well as morphine.  Plan to get plain film of the abdomen to evaluate for obstruction given she has had many surgeries and having diffuse pain.  Abdominal X-ray negative for obstruction. Exam non-concerning for acute surgical abdomen at this time. Upon recheck, patient reports improvement in pain after Haldol. Discussed this patient with hospitalist Dr. Horris Latino who will admit patient.   Final Clinical  Impressions(s) / ED Diagnoses   Final diagnoses:  None    ED Discharge Orders  None       Glyn Ade, PA-C 07/13/17 Gar Ponto, MD 07/14/17 [phone]

## 2017-07-13 NOTE — ED Triage Notes (Signed)
Pt complains of abdominal pain, n/v/d since this morning. Pt had recent cholecystectomy.

## 2017-07-13 NOTE — H&P (Signed)
History and Physical  DELCIE [name] MLJ:[id number] DOB: 1986/03/12 DOA: 07/13/2017  Referring physician: Geanie Kenning, ER PA PCP: [name], MD  Outpatient Specialists: None Patient coming from: Home & is able to ambulate without assistance  Chief Complaint: Nausea and vomiting and abdominal pain  HPI: [name] is a 32 y.o. female with medical history significant for diabetes, hypertension and status post renal transplant on immunosuppressive medications who underwent a laparoscopic cholecystectomy on 4/18, but since then continued to have abdominal pain and nausea and vomiting.  She was admitted to the hospitalist service on 4/21 noted to have acute kidney injury secondary to dehydration felt to be secondary to nausea and vomiting.  At that time, she had a work-up done including an endoscopy which noted delayed food in her stomach and symptoms were attributed to gastroparesis.  She presented back to the emergency room on 4/24, 1 day after discharge, with the same complaints and CT scan then noted hydronephrosis and bladder outlet obstruction.  Patient had Foley catheter placed with plans for urology follow-up.  She was not given any suppressive antibiotics at that time.  Patient's symptoms continued to persist and so she returned back to the emergency room today.  Lab work was noteworthy for creatinine of 2.4 (was 1.49 1-week ago, close to her baseline).  Patient found to have a large urinary tract infection although urinary sample came from catheter bag.  Lactic acid level and troponin normal.  White count elevated at 13.2.  Patient with markedly elevated blood pressures as she has not been able to keep down her medications.  Patient treated with a dose of IV Rocephin and hospitalist were called for further evaluation and admission.  She was given a dose of Haldol for her gastroparesis which only briefly worked   Review of Systems: Patient seen in the emergency room. Pt complains of  abdominal pain, mostly periumbilical.  She could not tell her the last time she had a solid bowel movement, she has had liquid stool for the last few weeks.  Feels quite nauseated along with vomiting  Pt denies any headaches, vision changes, dysphagia, chest pain, palpitations, shortness of breath, wheeze, cough, hematuria or dysuria, focal extremity numbness weakness or pain.  Review of systems are otherwise negative   Past Medical History:  Diagnosis Date  . Chronic kidney disease   . Diabetes mellitus    Pt reports diagnosis in June 2011  . Hyperlipidemia   . Hypertension   . Kidney transplant recipient   . 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 Pyelonephritis 06/23/2014  . UTI (urinary tract infection) 01/09/2015  . 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 CHOLECYSTECTOMY N/A 06/30/2017   Procedure: LAPAROSCOPIC CHOLECYSTECTOMY WITH INTRAOPERATIVE CHOLANGIOGRAM;  Surgeon: Excell Seltzer, MD;  Location: [facility];  Service: General;  Laterality: N/A;  . ESOPHAGOGASTRODUODENOSCOPY (EGD) WITH PROPOFOL N/A 07/04/2017   Procedure: ESOPHAGOGASTRODUODENOSCOPY (EGD) WITH PROPOFOL;  Surgeon: Clarene Essex, MD;  Location: [facility];  Service: Endoscopy;  Laterality: N/A;  . KIDNEY TRANSPLANT  2007  . RENAL BIOPSY Bilateral 2003  . THYROID LOBECTOMY      Social History:  reports that she has never smoked. She has never used smokeless tobacco. She reports that she does not drink alcohol or use drugs.  Lives at home with  her husband.  Ambulates without assistance.   Allergies  Allergen Reactions  . Benadryl [Diphenhydramine-Zinc Acetate] Shortness Of Breath  . Motrin [Ibuprofen] Shortness Of Breath and Itching    Per pt  . Banana Other (See Comments)    Sick on the stomach  . Diphenhydramine Hcl     REACTION: Stopped breathing  in ICU  . Doxycycline     Shortness of Breath   . Iron Dextran     REACTION: vein irritation  . Shellfish Allergy Hives    Family History  Problem Relation Age of Onset  . Arthritis Mother   . Diabetes Mother   . Hypertension Mother       Prior to Admission medications   Medication Sig Start Date End Date Taking? Authorizing Provider  famotidine (PEPCID) 40 MG tablet Take 1 tablet (40 mg total) by mouth daily. 07/05/17  Yes Patrecia Pour, Christean Grief, MD  fluticasone [facility]) 50 MCG/ACT nasal spray Place 2 sprays into both nostrils daily. 07/20/15  Yes Muthersbaugh, Jarrett Soho, PA-C  insulin degludec (TRESIBA FLEXTOUCH) 100 UNIT/ML SOPN FlexTouch Pen Inject 1.3 mLs (130 Units total) into the skin daily. And pen needles 1/day Patient taking differently: Inject 60 Units into the skin daily. And pen needles 1/day 07/22/16  Yes Renato Shin, MD  morphine (MSIR) 15 MG tablet Take 1 tablet (15 mg total) by mouth every 4 (four) hours as needed for severe pain. 07/06/17  Yes Deno Etienne, DO  ondansetron (ZOFRAN ODT) 4 MG disintegrating tablet 4mg  ODT q4 hours prn nausea/vomit 07/06/17  Yes Deno Etienne, DO  ACCU-CHEK SOFTCLIX LANCETS lancets Use to check blood sugar 4 times per day. 12/29/15   Renato Shin, MD  acetaminophen (TYLENOL) 500 MG tablet Take 1,000 mg by mouth daily as needed for moderate pain.     [provider]  cetirizine (ZYRTEC) 10 MG tablet Take 10 mg by mouth daily.    [provider]  cyclobenzaprine (FLEXERIL) 10 MG tablet Take 1 tablet (10 mg total) by mouth 2 (two) times daily as needed for muscle spasms. Patient not taking: Reported on 07/13/2017 07/30/16   Montine Circle, PA-C  diazepam (VALIUM) 5 MG tablet Take 1 tablet (5 mg total) by mouth every 6 (six) hours as needed for anxiety (spasms). 05/11/15   Elnora Morrison, MD  epoetin alfa (EPOGEN,PROCRIT) 16109 UNIT/ML injection Inject 30,000 Units into the skin every 14 (fourteen) days.    [provider]    furosemide (LASIX) 20 MG tablet Take 20 mg by mouth daily as needed. Feet swelling    [provider]  gabapentin (NEURONTIN) 100 MG capsule Take 200 mg by mouth daily as needed (for feet).  11/12/16   [provider]  hydrOXYzine (ATARAX/VISTARIL) 50 MG tablet Take 50 mg by mouth 2 (two) times daily as needed for itching.     [provider]  insulin aspart (NOVOLOG FLEXPEN) 100 UNIT/ML FlexPen Inject 15 Units into the skin 2 (two) times daily with a meal. 81-150=10 units 51-200=11 units 201-250=12 units 251-100=13 units 301-350=14 units >351=15 u Per sliding scale    [provider]  loperamide (IMODIUM) 2 MG capsule Take 2 mg by mouth as needed for diarrhea or loose stools. Take 2 tablets as needed for diarrhea.    [provider]  mycophenolate (MYFORTIC) 360 MG TBEC Take 720 mg by mouth 2 (two) times daily.     [provider]  ondansetron (ZOFRAN) 4 MG tablet Take 1 tablet (4 mg total) by mouth  every 8 (eight) hours as needed for nausea or vomiting. Patient not taking: Reported on 07/13/2017 06/28/17   Maczis, Barth Kirks, PA-C  oxyCODONE (OXY IR/ROXICODONE) 5 MG immediate release tablet Take 1 tablet (5 mg total) by mouth every 6 (six) hours as needed for severe pain. 07/01/17   [name], MD  predniSONE (DELTASONE) 5 MG tablet Take 5 mg by mouth at bedtime.     [provider]  promethazine (PHENERGAN) 25 MG suppository Place 1 suppository (25 mg total) rectally every 6 (six) hours as needed for nausea or vomiting. 07/06/17   Deno Etienne, DO  ranitidine (ZANTAC) 150 MG tablet Take 1 tablet (150 mg total) by mouth 2 (two) times daily. 07/06/17   Deno Etienne, DO  simvastatin (ZOCOR) 20 MG tablet Take 20 mg by mouth 3 (three) times a week. MWF.    [provider]  tacrolimus (PROGRAF) 1 MG capsule Take 2 mg by mouth 2 (two) times daily.     [provider]    Physical Exam: BP (!) 155/102 (BP Location: Left Arm)    Pulse (!) 114   Temp 98.1 F (36.7 C) (Oral)   Resp 18   Ht 5\' 6"  (1.676 m)   Wt 76.5 kg (168 lb 10.4 oz)   SpO2 100%   BMI 27.22 kg/m   General: Somewhat drowsy following Haldol, mild distress from nausea and vomiting Eyes: Sclera nonicteric, extra ocular movements are intact ENT: Normocephalic and atraumatic, mucous memories are dry Neck: Supple, no JVD Cardiovascular: Regular rate and rhythm, borderline tachycardia Respiratory: Clear to auscultation bilaterally Abdomen: Soft, scarring noted around abdomen from previous surgeries, nondistended, few bowel sounds, generalized nonspecific tenderness Skin: No other skin breaks, tears or lesions Musculoskeletal: No clubbing or cyanosis or edema Psychiatric: Appropriate, flattened affect, no acute psychoses Neurologic: No focal deficits          Labs on Admission:  Basic Metabolic Panel: Recent Labs  Lab 07/13/17 1005  NA 139  K 4.5  CL 105  CO2 20*  GLUCOSE 249*  BUN 35*  CREATININE 2.40*  CALCIUM 7.4*   Liver Function Tests: Recent Labs  Lab 07/13/17 1005  AST 19  ALT 13*  ALKPHOS 66  BILITOT 0.5  PROT 8.2*  ALBUMIN 3.8   Recent Labs  Lab 07/13/17 1005  LIPASE 42   No results for input(s): AMMONIA in the last 168 hours. CBC: Recent Labs  Lab 07/07/17 1029 07/13/17 1005  WBC  --  13.2*  HGB 11.5* 10.5*  HCT  --  33.9*  MCV  --  89.7  PLT  --  392   Cardiac Enzymes: Recent Labs  Lab 07/13/17 1425  TROPONINI <0.03    BNP (last 3 results) No results for input(s): BNP in the last 8760 hours.  ProBNP (last 3 results) No results for input(s): PROBNP in the last 8760 hours.  CBG: Recent Labs  Lab 07/13/17 1645  GLUCAP 343*    Radiological Exams on Admission: Dg Abd Portable 1v  Result Date: 07/13/2017 CLINICAL DATA:  Abdominal pain with nausea and vomiting EXAM: PORTABLE ABDOMEN - 1 VIEW COMPARISON:  CT abdomen and pelvis July 06, 2017 FINDINGS: There is moderate stool in the colon.  There is no appreciable bowel dilatation or air-fluid level to suggest bowel obstruction. No free air. There are surgical clips in the right quadrant and right medial pelvis. IMPRESSION: No bowel obstruction or free air.  Moderate stool in colon. Electronically Signed   By: Gwyndolyn Saxon  Jasmine December III M.D.   On: 07/13/2017 13:05    EKG:  not done  Assessment/Plan Present on Admission: . AKI (acute kidney injury) ([place]) with underlying chronic kidney disease, stage II and history of kidney transplant: IV fluids and checking Prograf level.  Suspect more from dehydration from nausea and vomiting. . Essential hypertension, benign: Unable to keep down oral medications, IV hydralazine  . Acute lower UTI: IV Rocephin, cultures not sent as urine came from bag.  She had catheter placed last week, with outpatient urology follow-up, but did not get placed on any prophylactic antibiotics.  Suspect that infection is real.  . Constipation: Patient tells me her last bowel movement was earlier today, but it was almost all liquid.  She could not tell me the last time that she had a solid bowel movement, is likely been several weeks.  Checked follow-up abdominal x-ray which noted large amount of stool burden.  Will try lactulose.  If this does not work, will try fleets enema.  . Intractable nausea and vomiting/abdominal pain: Suspect obstipation may be the issue.  Will try lactulose and if this does not work, could try fleets enema.  Patient had endoscopy at last hospitalization she had delayed food outlet, but she has not had much relief with medication treating gastroparesis.  Given x-ray findings and pain description, suspect UTI and constipation.  I am not so sure if this is gastroparesis as her last A1c on 4/21 was at 8.1.  Diabetes mellitus with chronic kidney disease on long-term use of insulin: Sliding scale and Lantus.  Expect higher sugars as she is getting stress dose steroids  Principal Problem:   AKI (acute  kidney injury) ([place]) Active Problems:   Essential hypertension, benign   CHRONIC KIDNEY DISEASE STAGE II (MILD)   XXX syndrome   KIDNEY TRANSPLANTATION, HX OF   Intractable nausea and vomiting   Acute lower UTI   Constipation   DVT prophylaxis: Lovenox  Code Status: Full code  Family Communication: Left message for husband  Disposition Plan: Potential discharge tomorrow if constipation and infection treated, renal function normalized  Consults called: None  Admission status: Given potential transfer discharge tomorrow, placed under observation    Annita Brod MD [facility] Pager [phone]  If 7PM-7AM, please contact night-coverage [email or web address] Password [facility]  07/13/2017, 7:44 PM

## 2017-07-14 ENCOUNTER — Observation Stay (HOSPITAL_COMMUNITY): Payer: Medicare Other

## 2017-07-14 DIAGNOSIS — R1033 Periumbilical pain: Secondary | ICD-10-CM | POA: Diagnosis not present

## 2017-07-14 DIAGNOSIS — E108 Type 1 diabetes mellitus with unspecified complications: Secondary | ICD-10-CM | POA: Diagnosis not present

## 2017-07-14 DIAGNOSIS — R112 Nausea with vomiting, unspecified: Secondary | ICD-10-CM

## 2017-07-14 DIAGNOSIS — N182 Chronic kidney disease, stage 2 (mild): Secondary | ICD-10-CM | POA: Diagnosis not present

## 2017-07-14 DIAGNOSIS — Q970 Karyotype 47, XXX: Secondary | ICD-10-CM | POA: Diagnosis not present

## 2017-07-14 DIAGNOSIS — T8619 Other complication of kidney transplant: Secondary | ICD-10-CM | POA: Diagnosis present

## 2017-07-14 DIAGNOSIS — Z8249 Family history of ischemic heart disease and other diseases of the circulatory system: Secondary | ICD-10-CM | POA: Diagnosis not present

## 2017-07-14 DIAGNOSIS — N183 Chronic kidney disease, stage 3 (moderate): Secondary | ICD-10-CM | POA: Diagnosis present

## 2017-07-14 DIAGNOSIS — I129 Hypertensive chronic kidney disease with stage 1 through stage 4 chronic kidney disease, or unspecified chronic kidney disease: Secondary | ICD-10-CM | POA: Diagnosis present

## 2017-07-14 DIAGNOSIS — I10 Essential (primary) hypertension: Secondary | ICD-10-CM | POA: Diagnosis not present

## 2017-07-14 DIAGNOSIS — K3184 Gastroparesis: Secondary | ICD-10-CM | POA: Diagnosis present

## 2017-07-14 DIAGNOSIS — Z91013 Allergy to seafood: Secondary | ICD-10-CM | POA: Diagnosis not present

## 2017-07-14 DIAGNOSIS — Z79899 Other long term (current) drug therapy: Secondary | ICD-10-CM | POA: Diagnosis not present

## 2017-07-14 DIAGNOSIS — E860 Dehydration: Secondary | ICD-10-CM | POA: Diagnosis present

## 2017-07-14 DIAGNOSIS — Z91018 Allergy to other foods: Secondary | ICD-10-CM | POA: Diagnosis not present

## 2017-07-14 DIAGNOSIS — N179 Acute kidney failure, unspecified: Secondary | ICD-10-CM | POA: Diagnosis not present

## 2017-07-14 DIAGNOSIS — Y830 Surgical operation with transplant of whole organ as the cause of abnormal reaction of the patient, or of later complication, without mention of misadventure at the time of the procedure: Secondary | ICD-10-CM | POA: Diagnosis not present

## 2017-07-14 DIAGNOSIS — Z833 Family history of diabetes mellitus: Secondary | ICD-10-CM | POA: Diagnosis not present

## 2017-07-14 DIAGNOSIS — Z881 Allergy status to other antibiotic agents status: Secondary | ICD-10-CM | POA: Diagnosis not present

## 2017-07-14 DIAGNOSIS — Z7952 Long term (current) use of systemic steroids: Secondary | ICD-10-CM | POA: Diagnosis not present

## 2017-07-14 DIAGNOSIS — Z888 Allergy status to other drugs, medicaments and biological substances status: Secondary | ICD-10-CM | POA: Diagnosis not present

## 2017-07-14 DIAGNOSIS — R109 Unspecified abdominal pain: Secondary | ICD-10-CM | POA: Diagnosis not present

## 2017-07-14 DIAGNOSIS — Z940 Kidney transplant status: Secondary | ICD-10-CM | POA: Diagnosis not present

## 2017-07-14 DIAGNOSIS — Z794 Long term (current) use of insulin: Secondary | ICD-10-CM | POA: Diagnosis not present

## 2017-07-14 DIAGNOSIS — Z7951 Long term (current) use of inhaled steroids: Secondary | ICD-10-CM | POA: Diagnosis not present

## 2017-07-14 DIAGNOSIS — K5900 Constipation, unspecified: Secondary | ICD-10-CM | POA: Diagnosis present

## 2017-07-14 DIAGNOSIS — K219 Gastro-esophageal reflux disease without esophagitis: Secondary | ICD-10-CM | POA: Diagnosis present

## 2017-07-14 DIAGNOSIS — E1022 Type 1 diabetes mellitus with diabetic chronic kidney disease: Secondary | ICD-10-CM | POA: Diagnosis not present

## 2017-07-14 DIAGNOSIS — K449 Diaphragmatic hernia without obstruction or gangrene: Secondary | ICD-10-CM | POA: Diagnosis present

## 2017-07-14 DIAGNOSIS — E1065 Type 1 diabetes mellitus with hyperglycemia: Secondary | ICD-10-CM | POA: Diagnosis not present

## 2017-07-14 DIAGNOSIS — E1043 Type 1 diabetes mellitus with diabetic autonomic (poly)neuropathy: Secondary | ICD-10-CM | POA: Diagnosis present

## 2017-07-14 DIAGNOSIS — K2970 Gastritis, unspecified, without bleeding: Secondary | ICD-10-CM | POA: Diagnosis present

## 2017-07-14 DIAGNOSIS — Z9049 Acquired absence of other specified parts of digestive tract: Secondary | ICD-10-CM | POA: Diagnosis not present

## 2017-07-14 DIAGNOSIS — N390 Urinary tract infection, site not specified: Secondary | ICD-10-CM | POA: Diagnosis not present

## 2017-07-14 LAB — GLUCOSE, CAPILLARY
GLUCOSE-CAPILLARY: 171 mg/dL — AB (ref 65–99)
GLUCOSE-CAPILLARY: 105 mg/dL — AB (ref 65–99)
GLUCOSE-CAPILLARY: 214 mg/dL — AB (ref 65–99)
Glucose-Capillary: 237 mg/dL — ABNORMAL HIGH (ref 65–99)

## 2017-07-14 LAB — BASIC METABOLIC PANEL
Anion gap: 12 (ref 5–15)
BUN: 29 mg/dL — AB (ref 6–20)
CALCIUM: 6.8 mg/dL — AB (ref 8.9–10.3)
CHLORIDE: 104 mmol/L (ref 101–111)
CO2: 23 mmol/L (ref 22–32)
CREATININE: 2.34 mg/dL — AB (ref 0.44–1.00)
GFR, EST AFRICAN AMERICAN: 30 mL/min — AB (ref >60)
GFR, EST NON AFRICAN AMERICAN: 26 mL/min — AB (ref >60)
Glucose, Bld: 272 mg/dL — ABNORMAL HIGH (ref 65–99)
Potassium: 4.4 mmol/L (ref 3.5–5.1)
SODIUM: 139 mmol/L (ref 135–145)

## 2017-07-14 LAB — CBC
HCT: 29 % — ABNORMAL LOW (ref 36.0–46.0)
Hemoglobin: 9 g/dL — ABNORMAL LOW (ref 12.0–15.0)
MCH: 28.1 pg (ref 26.0–34.0)
MCHC: 31 g/dL (ref 30.0–36.0)
MCV: 90.6 fL (ref 78.0–100.0)
PLATELETS: 332 10*3/uL (ref 150–400)
RBC: 3.2 MIL/uL — AB (ref 3.87–5.11)
RDW: 15.2 % (ref 11.5–15.5)
WBC: 10 10*3/uL (ref 4.0–10.5)

## 2017-07-14 MED ORDER — METHYLPREDNISOLONE SODIUM SUCC 40 MG IJ SOLR: 10.0000 mg | Freq: Two times a day (BID) | INTRAMUSCULAR | Status: DC

## 2017-07-14 MED ORDER — PREDNISONE 5 MG PO TABS
5.0000 mg | ORAL_TABLET | Freq: Every day | ORAL | Status: DC
  Administered 2017-07-14: 5 mg via ORAL
  Filled 2017-07-14: qty 1

## 2017-07-14 MED ORDER — ENOXAPARIN SODIUM 40 MG/0.4ML ~~LOC~~ SOLN
40.0000 mg | SUBCUTANEOUS | Status: DC
  Administered 2017-07-14: 40 mg via SUBCUTANEOUS
  Filled 2017-07-14: qty 0.4

## 2017-07-14 NOTE — Consult Note (Addendum)
Renal Service Consult Note [facility]  [name] 07/14/2017 [name] Requesting Physician:  Dr Wynelle Cleveland  Reason for Consult:  Acute on crf / kidney transplant HPI: The patient is a 32 y.o. year-old with hx of esrd d/t fsgs sp renal tx 2007 at [place], baseline creat 1.5- 1.7. Admitted here 4/15 - 4/19 for lap chole w/ creat 1.7- 1.9.  Was readmitted 4/20- 4/23 for nausea /vomiting/ cg emesis , seen by gi had egd showing food residue and gastritis got better clinically CT abd showed normal tx kidney and pt was dc'd home w creat 1.7- 1.8.  Came back to ED the next day on 4/24 w/ abd pain and CT scan showed new renal tx hydro w/ distended bladder and ED called urology who said place foley and have her f/u with her transplant doctor. She had an appt today w her kidney md dr Lorrene Reid but ended up in ed yest with abd pain , nausea and diarrhea x 24 hrs and vomiting. Creat is up at 2.4 yest and 2.3 this am.  Pt still has foley in from 4/24 ed visit. No fevers or chills, no Sob or cough. Asked to see for renal transplant.   ROS  denies CP  no joint pain   no HA  no blurry vision  no rash    Past Medical History  Past Medical History:  Diagnosis Date  . Chronic kidney disease   . Diabetes mellitus    Pt reports diagnosis in June 2011  . Hyperlipidemia   . Hypertension   . Kidney transplant recipient   . 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 Pyelonephritis 06/23/2014  . UTI (urinary tract infection) 01/09/2015  . 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 CHOLECYSTECTOMY N/A 06/30/2017   Procedure: LAPAROSCOPIC CHOLECYSTECTOMY WITH INTRAOPERATIVE CHOLANGIOGRAM;  Surgeon: Excell Seltzer, MD;  Location: [facility];  Service: General;  Laterality: N/A;  . ESOPHAGOGASTRODUODENOSCOPY  (EGD) WITH PROPOFOL N/A 07/04/2017   Procedure: ESOPHAGOGASTRODUODENOSCOPY (EGD) WITH PROPOFOL;  Surgeon: Clarene Essex, MD;  [facility];  Service: Endoscopy;  Laterality: N/A;  . KIDNEY TRANSPLANT  2007  . RENAL BIOPSY Bilateral 2003  . THYROID LOBECTOMY     Family History  Family History  Problem Relation Age of Onset  . Arthritis Mother   . Diabetes Mother   . Hypertension Mother    Social History  reports that she has never smoked. She has never used smokeless tobacco. She reports that she does not drink alcohol or use drugs. Allergies  Allergies  Allergen Reactions  . Benadryl [Diphenhydramine-Zinc Acetate] Shortness Of Breath  . Motrin [Ibuprofen] Shortness Of Breath and Itching    Per pt  . Banana Other (See Comments)    Sick on the stomach  . Diphenhydramine Hcl     REACTION: Stopped breathing in ICU  . Doxycycline     Shortness of Breath   . Iron Dextran     REACTION: vein irritation  . Shellfish Allergy Hives   Home medications Prior to Admission medications   Medication Sig Start Date End Date Taking? Authorizing Provider  acetaminophen (TYLENOL) 500 MG tablet Take 1,000 mg by mouth daily as needed for moderate pain.    Yes [provider]  cetirizine (ZYRTEC)  10 MG tablet Take 10 mg by mouth daily.   Yes [provider]  epoetin alfa (EPOGEN,PROCRIT) 81191 UNIT/ML injection Inject 30,000 Units into the skin every 14 (fourteen) days.   Yes [provider]  famotidine (PEPCID) 40 MG tablet Take 1 tablet (40 mg total) by mouth daily. 07/05/17  Yes Patrecia Pour, Christean Grief, MD  fluticasone [facility]) 50 MCG/ACT nasal spray Place 2 sprays into both nostrils daily. 07/20/15  Yes Muthersbaugh, Jarrett Soho, PA-C  furosemide (LASIX) 20 MG tablet Take 20 mg by mouth daily as needed. Feet swelling   Yes [provider]  gabapentin (NEURONTIN) 100 MG capsule Take 200 mg by mouth daily as needed (for feet).  11/12/16  Yes [provider]   hydrOXYzine (ATARAX/VISTARIL) 50 MG tablet Take 50 mg by mouth 2 (two) times daily as needed for itching.    Yes [provider]  insulin aspart (NOVOLOG FLEXPEN) 100 UNIT/ML FlexPen Inject 15 Units into the skin 2 (two) times daily with a meal. 81-150=10 units 51-200=11 units 201-250=12 units 251-100=13 units 301-350=14 units >351=15 u Per sliding scale   Yes [provider]  insulin degludec (TRESIBA FLEXTOUCH) 100 UNIT/ML SOPN FlexTouch Pen Inject 1.3 mLs (130 Units total) into the skin daily. And pen needles 1/day Patient taking differently: Inject 60 Units into the skin daily. And pen needles 1/day 07/22/16  Yes Renato Shin, MD  loperamide (IMODIUM) 2 MG capsule Take 2 mg by mouth as needed for diarrhea or loose stools. Take 2 tablets as needed for diarrhea.   Yes [provider]  mycophenolate (MYFORTIC) 360 MG TBEC Take 720 mg by mouth 2 (two) times daily.    Yes [provider]  ondansetron (ZOFRAN ODT) 4 MG disintegrating tablet 4mg  ODT q4 hours prn nausea/vomit 07/06/17  Yes Deno Etienne, DO  predniSONE (DELTASONE) 5 MG tablet Take 5 mg by mouth at bedtime.    Yes [provider]  ACCU-CHEK SOFTCLIX LANCETS lancets Use to check blood sugar 4 times per day. 12/29/15   Renato Shin, MD  cyclobenzaprine (FLEXERIL) 10 MG tablet Take 1 tablet (10 mg total) by mouth 2 (two) times daily as needed for muscle spasms. Patient not taking: Reported on 07/13/2017 07/30/16   Montine Circle, PA-C  diazepam (VALIUM) 5 MG tablet Take 1 tablet (5 mg total) by mouth every 6 (six) hours as needed for anxiety (spasms). 05/11/15   Elnora Morrison, MD  ondansetron (ZOFRAN) 4 MG tablet Take 1 tablet (4 mg total) by mouth every 8 (eight) hours as needed for nausea or vomiting. Patient not taking: Reported on 07/13/2017 06/28/17   Maczis, Barth Kirks, PA-C  oxyCODONE (OXY IR/ROXICODONE) 5 MG immediate release tablet Take 1 tablet (5 mg total) by mouth every 6 (six) hours as  needed for severe pain. 07/01/17   [name], MD  promethazine (PHENERGAN) 25 MG suppository Place 1 suppository (25 mg total) rectally every 6 (six) hours as needed for nausea or vomiting. 07/06/17   Deno Etienne, DO  ranitidine (ZANTAC) 150 MG tablet Take 1 tablet (150 mg total) by mouth 2 (two) times daily. 07/06/17   Deno Etienne, DO  simvastatin (ZOCOR) 20 MG tablet Take 20 mg by mouth 3 (three) times a week. MWF.    [provider]  tacrolimus (PROGRAF) 1 MG capsule Take 2 mg by mouth 2 (two) times daily.     [provider]   Liver Function Tests Recent Labs  Lab 07/13/17 1005  AST 19  ALT 13*  ALKPHOS 66  BILITOT 0.5  PROT 8.2*  ALBUMIN 3.8   Recent Labs  Lab 07/13/17 1005  LIPASE 42   CBC Recent Labs  Lab 07/13/17 1005 07/14/17 0522  WBC 13.2* 10.0  HGB 10.5* 9.0*  HCT 33.9* 29.0*  MCV 89.7 90.6  PLT 392 594   Basic Metabolic Panel Recent Labs  Lab 07/13/17 1005 07/14/17 0522  NA 139 139  K 4.5 4.4  CL 105 104  CO2 20* 23  GLUCOSE 249* 272*  BUN 35* 29*  CREATININE 2.40* 2.34*  CALCIUM 7.4* 6.8*   Iron/TIBC/Ferritin/ %Sat    Component Value Date/Time   IRON 65 12/23/2016 1000   TIBC 217 (L) 12/23/2016 1000   FERRITIN 1,024 (H) 12/23/2016 1000   IRONPCTSAT 30 12/23/2016 1000    Vitals:   07/13/17 1414 07/13/17 1541 07/13/17 2230 07/14/17 0430  BP: (!) 150/105 (!) 155/102 102/72 97/75  Pulse: (!) 108 (!) 114 (!) 107 96  Resp: 18  17 15   Temp: 98.1 F (36.7 C)  98.4 F (36.9 C) 98.1 F (36.7 C)  TempSrc: Oral  Oral Oral  SpO2: 100%  93% 96%  Weight: 76.5 kg (168 lb 10.4 oz)     Height: 5\' 6"  (1.676 m)      Exam Gen alert, no distress No rash, cyanosis or gangrene Sclera anicteric, throat clear  No jvd or bruits Chest clear bilat  RRR no MRG Abd soft ntnd RLQ transplant nontender GU foley in place draining amber urine clear MS no joint effusions or deformity Ext no LE edema, no wounds or ulcers Neuro is alert, Ox 3 ,  nf   Home meds: - lasix 20 prn - prograf 2 mg bid/ pred 5 hs/ myfortic 720 bid - statin/ PPI/ zantac - flexeril prn/ valium prn/ neurontin 200 qd prn/ oxy IR prn - novolog flexpen/ insulin degludec 130 units qd - prn's/ vitamins - epo 30K every 2 wks     Impression: 1 acute on ckd 3 w renal transplant - suspect this is due to n/v and vol depletion, looks a bit dry on exam, however need to eval kidney w/ imaging given recent episode of transplant hydro by CT on 4/24 in the ED which was treated w/ foley cath placement and needs f/u.  Have ordered renal tx ultrasound for today.  Will ^ ivf's to 125/hr.  Dorothy Puffer steroids to 10 mg bid. Continue po prograf and myfortic, she took them this am.  Check creat am.  2 recent lap chole 3 dm1 on insulin 4 xxx syndrome 5 hx UTI's - urine cx pending   Plan - will follow have d/w primary  Kelly Splinter MD [place] pager [phone]   07/14/2017, 1:47 PM

## 2017-07-14 NOTE — Consult Note (Signed)
[place] Nurse wound consult note Pt requested her callous to the toe be trimmed, using scapel to remove small pieces to the outer edges. This was not excisional sharp debridement, just conservative trimming. She is followed by the outpatient wound care center. She tolerated without pain or bleeding.  Refer to [facility] WOC progress note for further consult. Please re-consult if further assistance is needed.  Thank-you,  [name] MSN, [place], [place], [place], [place]

## 2017-07-14 NOTE — Consult Note (Signed)
[place] Nurse wound consult note Reason for Consult:right great toe chronic wound. (Wound Center at [facility], missed appt while hospitalized.) Wound type: neuropathic Pressure Injury POA: NA Measurement: 0.8cm x 0.4cm x 0cm (no depth, calloused area) Wound bed: dark brown callous Drainage (amount, consistency, odor) none Periwound: dry Dressing procedure/placement/frequency: Patient states she uses Hydrofera Blue from [employer]. We do not carry that product. [name], WOC-RN trimmed callous with scalpel. Patient tolerated well. Orders placed for Xeroform to be used while in house. She will resume Hydrofera Blue when she returns home. Patient will reschedule appt with [facility] upon discharge. We will not follow, but will remain available to this patient, to nursing, and the medical and/or surgical teams.  Please re-consult if we need to assist further.  Fara Olden, RN-C, WTA-C, [place] Wound Treatment Associate Ostomy Care Associate

## 2017-07-14 NOTE — Progress Notes (Signed)
Inpatient Diabetes Program Recommendations  AACE/ADA: New Consensus Statement on Inpatient Glycemic Control (2015)  Target Ranges:  Prepandial:   less than 140 mg/dL      Peak postprandial:   less than 180 mg/dL (1-2 hours)      Critically ill patients:  140 - 180 mg/dL   Results for MAKENAH, KARAS (MRN [id number]) as of 07/14/2017 08:47  Ref. Range 07/13/2017 16:45 07/13/2017 22:29 07/14/2017 07:29  Glucose-Capillary Latest Ref Range: 65 - 99 mg/dL 343 (H)  7 units NOVOLOG  337 (H)  5 units NOVOLOG +  50 units LANTUS 237 (H)    Home DM Meds: Tresiba 60 units daily        Novolog 10-15 units BID per SSI  Current Orders: Lantus 50 units QHS      Novolog Sensitive Correction Scale/ SSI (0-9 units) TID AC + HS       Note Lantus and Novolog started last PM.  Patient getting Solumedrol 10 mg daily which may lead to elevated CBGs.  Last saw her Endocrinologist (Dr. Melton Alar with [facility]) on 05/10/17.  At that visit, patient was instructed to: Continue Tresiba 80 units at bedtime Check blood glucose before each meal and at bedtime. Use the blood glucose before the meal to dose the Novolog.  Work on Chartered loss adjuster in before lunch and before dinner using the scale below: If blood glucose less than 80, no units of Novolog  If between 81-150, inject 10 units If between 151 - 200, inject 11 units If between 201 - 250, inject 12 units If between 251 - 300, inject 13 units If between 301-350, inject 14 units If greater than 351, inject 15 injects        MD- Please consider the following in-hospital insulin adjustments:  1. Increase Lantus to 60 units QHS  2 Start Novolog Meal Coverage: Novolog 6 units TID with meals (hold if pt eats <50% of meal)      --Will follow patient during hospitalization--  [name] RN, MSN, CDE Diabetes Coordinator Inpatient Glycemic Control Team Team Pager: [phone] (8a-5p)

## 2017-07-14 NOTE — Progress Notes (Signed)
Pharmacist-Provider Communication:  Order for enoxaparin 30 mg subcutaneously q24h was changed to enoxaparin 40 mg daily dose per protocol for CrCl > 30 mL/min.  Lenis Noon, PharmD 07/14/17 12:35 PM

## 2017-07-14 NOTE — Progress Notes (Signed)
PROGRESS NOTE    [name]   FXT:[id number]  DOB: 07-08-1985  DOA: 07/13/2017 PCP: [name], MD   Brief Narrative:  [name] is a 32 year old female with a renal transplant, diabetes mellitus on Insulin, triple X syndrome, pseudoseizures, HTN who presents to the hospital for vomiting and upper abdominal pain which started the same morning. She states vomiting started at 5 AM and then was followed by abdominal pain around 8 AM and then 3-4 liquid stools. She came in to the ED on 4/24 for generalized abdominal pain nausea and vomiting and had a CT which showed R hydropherosis. Urology, Dr Alinda Money recommended a Foley for possibly BOO and asked her to f/u with her nephrologist. ED doc also suspected her Oxycodone was causing some of her symptoms and gave her a prescription for MSIR.   Subjective: Vomited last night after having solid food for dinner. Wants to have solid food again today.  She has had more watery BMs.  She is angry when I suggest that we made her n.p.o. for bowel rest and states that she did not even want to be admitted to the hospital and just wanted refills on her pain medications.  Assessment & Plan:   Principal Problem:   AKI (acute kidney injury)/ right renal transplant/ recent hydronephrosis -Patient's creatinine was 1.51 on 4/23 and has worsened to 2.40 on 5/1 despite having a Foley that was placed for bladder outlet obstruction -Have asked for nephrology eval & nephrology as ordered renal ultrasound -Can continue IV fluids for prerenal component -Continue antirejection medications   Active Problems:   Recent cholecystectomy (4/18) - Abdominal pain, nausea and vomiting after stopping pain medications the day before -She is complaining of mid abdominal pain in the area of her laparoscopic incision- -it appears that she filled her MSIR prescription and continue to take it until the day before she presented to the ER when she states it finished- I have  explained to her that she should be stopping her narcotics by this point and if she is still having pain, this signifies she may have an acute problem - Gen surgery recommended a HIDA if she continued to have pain and vomiting.  - Today she has eaten french toast, yogurt and juices and is not vomiting- will  continue diet    Acute lower UTI? - Unfortunately urine was obtained from Foley bag rather than approximately from the Foley catheter and is unhelpful -no urine culture was sent - I will stop antibiotics  Moderate constipation -Noted on abdominal x-ray-all of her stool is liquid at this point-we will obtain another x-ray tomorrow  DVT prophylaxis: Lovenox Code Status: Full code Family Communication:  Disposition Plan: f/u on renal ultrasound Consultants:   nephrology Procedures:    Antimicrobials:  Anti-infectives (From admission, onward)   Start     Dose/Rate Route Frequency Ordered Stop   07/13/17 1400  cefTRIAXone (ROCEPHIN) 1 g in sodium chloride 0.9 % 100 mL IVPB     1 g 200 mL/hr over 30 Minutes Intravenous Every 24 hours 07/13/17 1328         Objective: Vitals:   07/13/17 1414 07/13/17 1541 07/13/17 2230 07/14/17 0430  BP: (!) 150/105 (!) 155/102 102/72 97/75  Pulse: (!) 108 (!) 114 (!) 107 96  Resp: 18  17 15   Temp: 98.1 F (36.7 C)  98.4 F (36.9 C) 98.1 F (36.7 C)  TempSrc: Oral  Oral Oral  SpO2: 100%  93% 96%  Weight:  76.5 kg (168 lb 10.4 oz)     Height: 5\' 6"  (1.676 m)       Intake/Output Summary (Last 24 hours) at 07/14/2017 1355 Last data filed at 07/14/2017 0200 Gross per 24 hour  Intake 972.5 ml  Output 850 ml  Net 122.5 ml   Filed Weights   07/13/17 1414  Weight: 76.5 kg (168 lb 10.4 oz)    Examination: General exam: Appears comfortable  HEENT: PERRLA, oral mucosa moist, no sclera icterus or thrush Respiratory system: Clear to auscultation. Respiratory effort normal. Cardiovascular system: S1 & S2 heard, RRR.   Gastrointestinal  system: Abdomen soft, tender in mid abdomen, nondistended. Normal bowel sound. No organomegaly Central nervous system: Alert and oriented. No focal neurological deficits. Extremities: No cyanosis, clubbing or edema Skin: No rashes or ulcers Psychiatry:  Mood & affect appropriate.     Data Reviewed: I have personally reviewed following labs and imaging studies  CBC: Recent Labs  Lab 07/13/17 1005 07/14/17 0522  WBC 13.2* 10.0  HGB 10.5* 9.0*  HCT 33.9* 29.0*  MCV 89.7 90.6  PLT 392 409   Basic Metabolic Panel: Recent Labs  Lab 07/13/17 1005 07/14/17 0522  NA 139 139  K 4.5 4.4  CL 105 104  CO2 20* 23  GLUCOSE 249* 272*  BUN 35* 29*  CREATININE 2.40* 2.34*  CALCIUM 7.4* 6.8*   GFR: Estimated Creatinine Clearance: 35.7 mL/min (A) (by C-G formula based on SCr of 2.34 mg/dL (H)). Liver Function Tests: Recent Labs  Lab 07/13/17 1005  AST 19  ALT 13*  ALKPHOS 66  BILITOT 0.5  PROT 8.2*  ALBUMIN 3.8   Recent Labs  Lab 07/13/17 1005  LIPASE 42   No results for input(s): AMMONIA in the last 168 hours. Coagulation Profile: No results for input(s): INR, PROTIME in the last 168 hours. Cardiac Enzymes: Recent Labs  Lab 07/13/17 1425  TROPONINI <0.03   BNP (last 3 results) No results for input(s): PROBNP in the last 8760 hours. HbA1C: No results for input(s): HGBA1C in the last 72 hours. CBG: Recent Labs  Lab 07/13/17 1645 07/13/17 2229 07/14/17 0729 07/14/17 1150  GLUCAP 343* 337* 237* 171*   Lipid Profile: No results for input(s): CHOL, HDL, LDLCALC, TRIG, CHOLHDL, LDLDIRECT in the last 72 hours. Thyroid Function Tests: No results for input(s): TSH, T4TOTAL, FREET4, T3FREE, THYROIDAB in the last 72 hours. Anemia Panel: No results for input(s): VITAMINB12, FOLATE, FERRITIN, TIBC, IRON, RETICCTPCT in the last 72 hours. Urine analysis:    Component Value Date/Time   COLORURINE STRAW (A) 07/13/2017 1007   APPEARANCEUR CLOUDY (A) 07/13/2017 1007    LABSPEC 1.010 07/13/2017 1007   PHURINE 5.0 07/13/2017 1007   GLUCOSEU NEGATIVE 07/13/2017 1007   HGBUR SMALL (A) 07/13/2017 1007   HGBUR negative 01/26/2010 1026   BILIRUBINUR NEGATIVE 07/13/2017 1007   BILIRUBINUR NEG 01/01/2011 1050   KETONESUR NEGATIVE 07/13/2017 1007   PROTEINUR 100 (A) 07/13/2017 1007   UROBILINOGEN 0.2 01/09/2015 1333   NITRITE NEGATIVE 07/13/2017 1007   LEUKOCYTESUR MODERATE (A) 07/13/2017 1007   Sepsis Labs: @LABRCNTIP (procalcitonin:4,lacticidven:4) )No results found for this or any previous visit (from the past 240 hour(s)).       Radiology Studies: Dg Abd Portable 1v  Result Date: 07/13/2017 CLINICAL DATA:  Abdominal pain with nausea and vomiting EXAM: PORTABLE ABDOMEN - 1 VIEW COMPARISON:  CT abdomen and pelvis July 06, 2017 FINDINGS: There is moderate stool in the colon. There is no appreciable bowel dilatation or air-fluid  level to suggest bowel obstruction. No free air. There are surgical clips in the right quadrant and right medial pelvis. IMPRESSION: No bowel obstruction or free air.  Moderate stool in colon. Electronically Signed   By: Lowella Grip III M.D.   On: 07/13/2017 13:05      Scheduled Meds: . docusate sodium  100 mg Oral BID  . enoxaparin (LOVENOX) injection  40 mg Subcutaneous Q24H  . insulin aspart  0-5 Units Subcutaneous QHS  . insulin aspart  0-9 Units Subcutaneous TID WC  . insulin glargine  50 Units Subcutaneous QHS  . methylPREDNISolone (SOLU-MEDROL) injection  10 mg Intravenous Q24H  . mycophenolate  720 mg Oral BID  . sodium chloride flush  3 mL Intravenous Q12H  . tacrolimus  2 mg Oral BID   Continuous Infusions: . sodium chloride 75 mL/hr at 07/14/17 0323  . cefTRIAXone (ROCEPHIN)  IV 1 g (07/14/17 1309)     LOS: 0 days    Time spent in minutes: 35    Debbe Odea, MD [facility] Pager: [email or web address] Password [email or web address] 07/14/2017, 1:55 PM

## 2017-07-14 NOTE — Care Management Obs Status (Signed)
[place] NOTIFICATION   Patient Details  Name: KOULA VENIER MRN: [id number] Date of Birth: 07-Aug-1985   Medicare Observation Status Notification Given:  Yes    MahabirJuliann Pulse, RN 07/14/2017, 1:24 PM

## 2017-07-15 DIAGNOSIS — N390 Urinary tract infection, site not specified: Secondary | ICD-10-CM

## 2017-07-15 DIAGNOSIS — I10 Essential (primary) hypertension: Secondary | ICD-10-CM

## 2017-07-15 DIAGNOSIS — E1065 Type 1 diabetes mellitus with hyperglycemia: Secondary | ICD-10-CM

## 2017-07-15 DIAGNOSIS — N182 Chronic kidney disease, stage 2 (mild): Secondary | ICD-10-CM

## 2017-07-15 DIAGNOSIS — Z940 Kidney transplant status: Secondary | ICD-10-CM

## 2017-07-15 DIAGNOSIS — E108 Type 1 diabetes mellitus with unspecified complications: Secondary | ICD-10-CM

## 2017-07-15 LAB — URINE CULTURE: CULTURE: NO GROWTH

## 2017-07-15 LAB — BASIC METABOLIC PANEL
ANION GAP: 8 (ref 5–15)
BUN: 28 mg/dL — ABNORMAL HIGH (ref 6–20)
CALCIUM: 6.7 mg/dL — AB (ref 8.9–10.3)
CO2: 23 mmol/L (ref 22–32)
Chloride: 108 mmol/L (ref 101–111)
Creatinine, Ser: 1.85 mg/dL — ABNORMAL HIGH (ref 0.44–1.00)
GFR calc non Af Amer: 35 mL/min — ABNORMAL LOW (ref >60)
GFR, EST AFRICAN AMERICAN: 40 mL/min — AB (ref >60)
Glucose, Bld: 193 mg/dL — ABNORMAL HIGH (ref 65–99)
Potassium: 4.9 mmol/L (ref 3.5–5.1)
SODIUM: 139 mmol/L (ref 135–145)

## 2017-07-15 LAB — GLUCOSE, CAPILLARY
GLUCOSE-CAPILLARY: 190 mg/dL — AB (ref 65–99)
Glucose-Capillary: 173 mg/dL — ABNORMAL HIGH (ref 65–99)

## 2017-07-15 MED ORDER — FAMOTIDINE 40 MG PO TABS: 40.0000 mg | ORAL_TABLET | Freq: Every day | ORAL | 0 refills | Status: DC

## 2017-07-15 MED ORDER — DOCUSATE SODIUM 100 MG PO CAPS: 100.0000 mg | ORAL_CAPSULE | Freq: Two times a day (BID) | ORAL | 0 refills | Status: DC

## 2017-07-15 MED ORDER — PREDNISONE 5 MG PO TABS: 5.0000 mg | ORAL_TABLET | Freq: Every day | ORAL | 0 refills | Status: DC

## 2017-07-15 NOTE — Progress Notes (Signed)
[facility] Progress Note  Subjective: transplant [name] is negative for hydro creat down to 1.8 today which is close to her baseline  Vitals:   07/13/17 2230 07/14/17 0430 07/14/17 2148 07/15/17 0604  BP: 102/72 97/75 124/88 124/89  Pulse: (!) 107 96 93 84  Resp: 17 15 17 18   Temp: 98.4 F (36.9 C) 98.1 F (36.7 C) 97.6 F (36.4 C) 98.7 F (37.1 C)  TempSrc: Oral Oral    SpO2: 93% 96% 99% 99%  Weight:      Height:        Inpatient medications: . docusate sodium  100 mg Oral BID  . enoxaparin (LOVENOX) injection  40 mg Subcutaneous Q24H  . insulin aspart  0-5 Units Subcutaneous QHS  . insulin aspart  0-9 Units Subcutaneous TID WC  . insulin glargine  50 Units Subcutaneous QHS  . mycophenolate  720 mg Oral BID  . predniSONE  5 mg Oral QHS  . sodium chloride flush  3 mL Intravenous Q12H  . tacrolimus  2 mg Oral BID   . sodium chloride 125 mL/hr at 07/15/17 0853   acetaminophen, hydrALAZINE, morphine injection, ondansetron **OR** ondansetron (ZOFRAN) IV  Exam: Genalert, no distress No rash, cyanosis or gangrene Sclera anicteric, throat clear  No jvd or bruits Chest clear bilat  RRR no MRG Abd soft ntnd RLQ transplant nontender GUfoley in place draining amber urine clear MS no joint effusions or deformity Extno LEedema, no wounds or ulcers Neuro is alert, Ox 3 , nf   Home meds: - lasix 20 prn - prograf 2 mg bid/ pred 5 hs/ myfortic 720 bid - statin/ PPI/ zantac - flexeril prn/ valium prn/ neurontin 200 qd prn/ oxy IR prn - novolog flexpen/ insulin degludec 130 units qd - prn's/ vitamins - epo 30K every 2 wks     Impression: 1 acute on ckd 3 w renal transplant - due to dehydration resolving creat today is about at baseline (1.4- 1.8), also ultrasound was neg for hydro which means we can pull foley and do voiding trials have d/w primary; if voiding well can be dc'd  2 dm2 post transplant prob steroid induced taking insulin 4 xxx  syndrome 5 hx uti's - urine cx neg   Plan - as above   Kelly Splinter MD [facility] pager [phone]   07/15/2017, 10:14 AM   Recent Labs  Lab 07/13/17 1005 07/14/17 0522 07/15/17 0531  NA 139 139 139  K 4.5 4.4 4.9  CL 105 104 108  CO2 20* 23 23  GLUCOSE 249* 272* 193*  BUN 35* 29* 28*  CREATININE 2.40* 2.34* 1.85*  CALCIUM 7.4* 6.8* 6.7*   Recent Labs  Lab 07/13/17 1005  AST 19  ALT 13*  ALKPHOS 66  BILITOT 0.5  PROT 8.2*  ALBUMIN 3.8   Recent Labs  Lab 07/13/17 1005 07/14/17 0522  WBC 13.2* 10.0  HGB 10.5* 9.0*  HCT 33.9* 29.0*  MCV 89.7 90.6  PLT 392 332   Iron/TIBC/Ferritin/ %Sat    Component Value Date/Time   IRON 65 12/23/2016 1000   TIBC 217 (L) 12/23/2016 1000   FERRITIN 1,024 (H) 12/23/2016 1000   IRONPCTSAT 30 12/23/2016 1000

## 2017-07-15 NOTE — Discharge Summary (Addendum)
Physician Discharge Summary  [name] FXT:[id number] DOB: Mar 20, 1985 DOA: 07/13/2017  PCP: [name], MD  Admit date: 07/13/2017 Discharge date: 07/15/2017  Admitted From: home  Disposition:  home   Recommendations for Outpatient Follow-up:  1. F/u PCP - she has an upcoming appt- needs Bmet at follow up visit 2. F/u on heartburn/ nausea/ vomiting/ abdominal pain- avoid narcotics please  Discharge Condition:  stable   CODE STATUS:  Full code   Consultations:  nephrology    Discharge Diagnoses:  Principal Problem:   AKI (acute kidney injury) ([place]) Active Problems: Nausea/ vomiting/ abd pain   Essential hypertension, benign   XXX syndrome   KIDNEY TRANSPLANTATION  / CHRONIC KIDNEY DISEASE STAGE II (MILD)      Brief Summary: [name] is a 32 year old female with a renal transplant, diabetes mellitus on Insulin, triple X syndrome, pseudoseizures, HTN who presents to the hospital for vomiting and upper abdominal pain which started the same morning. She states vomiting started at 5 AM and then was followed by abdominal pain around 8 AM and then 3-4 liquid stools. She came in to the ED on 4/24 for generalized abdominal pain nausea and vomiting and had a CT which showed R hydropherosis. Urology, Dr Alinda Money recommended a Foley for possibly BOO and asked her to f/u with her nephrologist. ED doc also suspected her Oxycodone was causing some of her symptoms and gave her a prescription for MSIR.     Hospital Course:  Principal Problem:   AKI (acute kidney injury)/ CKD 2/ right renal transplant/ recent hydronephrosis -Patient's creatinine was 1.51 on 4/23 and has worsened to 2.40 on 5/1 despite having a Foley that was placed for bladder outlet obstruction -Have asked for nephrology eval & nephrology as ordered renal ultrasound - continued IV fluids for prerenal component -Continue antirejection medications  - Cr today is 1.85- renal ultrasound not showing any further  hydronephrosis- foley removed and patient voided 300 cc with only 45 cc residual. Discussed with renal- She is stable to be discharged without foley   Active Problems:   Recent cholecystectomy (4/18) - Abdominal pain, nausea and vomiting after pain medications finished the day before- she states she only came to the ED to get more pain medications -She is complaining of mid abdominal pain in the area of her laparoscopic incision- -it appears that she filled her MSIR prescription and continue to take it until the day before she presented to the ER when she states it finished- I have explained to her that she should be stopping her narcotics by this point and if she is still having pain, this signifies she may have an acute problem - Gen surgery recommended a HIDA if she continued to have pain and vomiting.  - 5/2- she has eaten french toast, yogurt and juices and is not vomiting - 5/3- still no vomiting or abdominal pain- further questioned her about heartburn which she states she has-she is on an H2 blocker- I have talked to her about eating a bland, low fat diet and she then admits to having heart burn after the sausage she ate for breakfast today - have given her literature about what food to avoid - she is asking again if she will receive narcotic prescription and I have explained that she will not be. If she has more pain, she will need to call her PCP to help investigate why she is having it. - I suspect GERD/ Gastritis- see below- new script for Pepcid given  Acute lower UTI? - Unfortunately urine was obtained from Foley bag rather than approximately from the Foley catheter  -urine culture shows no growth-antibiotics stopped  Diabetes mellitus type 1 insulin requiring- uncontrolled - Last A1c 8.1 on 07/03/2017-continue home insulin      Discharge Exam: Vitals:   07/14/17 2148 07/15/17 0604  BP: 124/88 124/89  Pulse: 93 84  Resp: 17 18  Temp: 97.6 F (36.4 C) 98.7 F (37.1  C)  SpO2: 99% 99%   Vitals:   07/13/17 2230 07/14/17 0430 07/14/17 2148 07/15/17 0604  BP: 102/72 97/75 124/88 124/89  Pulse: (!) 107 96 93 84  Resp: 17 15 17 18   Temp: 98.4 F (36.9 C) 98.1 F (36.7 C) 97.6 F (36.4 C) 98.7 F (37.1 C)  TempSrc: Oral Oral    SpO2: 93% 96% 99% 99%  Weight:      Height:        General: Pt is alert, awake, not in acute distress Cardiovascular: RRR, S1/S2 +, no rubs, no gallops Respiratory: CTA bilaterally, no wheezing, no rhonchi Abdominal: Soft, NT, ND, bowel sounds + Extremities: no edema, no cyanosis   Discharge Instructions  Discharge Instructions    Diet Carb Modified   Complete by:  As directed    Bland diet   Increase activity slowly   Complete by:  As directed      Allergies as of 07/15/2017      Reactions   Benadryl [diphenhydramine-zinc Acetate] Shortness Of Breath   Motrin [ibuprofen] Shortness Of Breath, Itching   Per pt   Banana Other (See Comments)   Sick on the stomach   Diphenhydramine Hcl    REACTION: Stopped breathing in ICU   Doxycycline    Shortness of Breath    Iron Dextran    REACTION: vein irritation   Shellfish Allergy Hives      Medication List    STOP taking these medications   cyclobenzaprine 10 MG tablet Commonly known as:  FLEXERIL   diazepam 5 MG tablet Commonly known as:  VALIUM   epoetin alfa 10000 UNIT/ML injection Commonly known as:  EPOGEN,PROCRIT   ondansetron 4 MG tablet Commonly known as:  ZOFRAN   oxyCODONE 5 MG immediate release tablet Commonly known as:  Oxy IR/ROXICODONE   promethazine 25 MG suppository Commonly known as:  PHENERGAN   ranitidine 150 MG tablet Commonly known as:  ZANTAC     TAKE these medications   ACCU-CHEK SOFTCLIX LANCETS lancets Use to check blood sugar 4 times per day.   acetaminophen 500 MG tablet Commonly known as:  TYLENOL Take 1,000 mg by mouth daily as needed for moderate pain.   cetirizine 10 MG tablet Commonly known as:   ZYRTEC Take 10 mg by mouth daily.   docusate sodium 100 MG capsule Commonly known as:  COLACE Take 1 capsule (100 mg total) by mouth 2 (two) times daily.   famotidine 40 MG tablet Commonly known as:  PEPCID Take 1 tablet (40 mg total) by mouth daily. Can take up to 2 x day (every 12 hrs) What changed:  additional instructions   fluticasone 50 MCG/ACT nasal spray Commonly known as:  FLONASE Place 2 sprays into both nostrils daily.   furosemide 20 MG tablet Commonly known as:  LASIX Take 20 mg by mouth daily as needed. Feet swelling   gabapentin 100 MG capsule Commonly known as:  NEURONTIN Take 200 mg by mouth daily as needed (for feet).   hydrOXYzine 50 MG tablet Commonly  known as:  ATARAX/VISTARIL Take 50 mg by mouth 2 (two) times daily as needed for itching.   insulin degludec 100 UNIT/ML Sopn FlexTouch Pen Commonly known as:  TRESIBA FLEXTOUCH Inject 1.3 mLs (130 Units total) into the skin daily. And pen needles 1/day What changed:    how much to take  additional instructions   loperamide 2 MG capsule Commonly known as:  IMODIUM Take 2 mg by mouth as needed for diarrhea or loose stools. Take 2 tablets as needed for diarrhea.   mycophenolate 360 MG Tbec EC tablet Commonly known as:  MYFORTIC Take 720 mg by mouth 2 (two) times daily.   NOVOLOG FLEXPEN 100 UNIT/ML FlexPen Generic drug:  insulin aspart Inject 15 Units into the skin 2 (two) times daily with a meal. 81-150=10 units 51-200=11 units 201-250=12 units 251-100=13 units 301-350=14 units >351=15 u Per sliding scale   ondansetron 4 MG disintegrating tablet Commonly known as:  ZOFRAN ODT 4mg  ODT q4 hours prn nausea/vomit   predniSONE 5 MG tablet Commonly known as:  DELTASONE Take 1 tablet (5 mg total) by mouth at bedtime.   simvastatin 20 MG tablet Commonly known as:  ZOCOR Take 20 mg by mouth 3 (three) times a week. MWF.   tacrolimus 1 MG capsule Commonly known as:  PROGRAF Take 2 mg by mouth  2 (two) times daily.      Follow-up Information    [name], MD Follow up.   Specialty:  Internal Medicine Why:  patient has appt coming up Contact information: [place] [address] [phone]        Jamal Maes, MD Follow up.   Specialty:  Nephrology Contact information: [address] [place] [address] [phone]          Allergies  Allergen Reactions  . Benadryl [Diphenhydramine-Zinc Acetate] Shortness Of Breath  . Motrin [Ibuprofen] Shortness Of Breath and Itching    Per pt  . Banana Other (See Comments)    Sick on the stomach  . Diphenhydramine Hcl     REACTION: Stopped breathing in ICU  . Doxycycline     Shortness of Breath   . Iron Dextran     REACTION: vein irritation  . Shellfish Allergy Hives     Procedures/Studies:    Ct Abdomen Pelvis Wo Contrast  Result Date: 07/06/2017 CLINICAL DATA:  Recent cholecystectomy, history of renal transplant, severe mid abdominal pain, nausea/vomiting EXAM: CT ABDOMEN AND PELVIS WITHOUT CONTRAST TECHNIQUE: Multidetector CT imaging of the abdomen and pelvis was performed following the standard protocol without IV contrast. COMPARISON:  07/02/2017 FINDINGS: Lower chest: Mild ground-glass opacity at the lung bases. Hepatobiliary: Unenhanced liver is unremarkable. Status post cholecystectomy. New complete resolution of postoperative seroma, with trace residual fluid inferior to the surgical bed (series 2/image 30). No intrahepatic or extrahepatic ductal dilatation. Pancreas: Within normal limits. Spleen: Within normal limits. Adrenals/Urinary Tract: Adrenal glands are within normal limits. Native left renal cortical scarring/atrophy. Native right kidney is not discretely visualized. Right lower quadrant renal transplant with stable 3.1 cm cortical cyst (series 2/image 67). Moderate hydronephrosis involving the transplant kidney (series 2/image 60), new. Kidney is enlarged when compared to the  prior. Bladder is mildly distended. Stomach/Bowel: Stomach is within normal limits. No evidence of bowel obstruction. Normal appendix (series 2/image 50). Vascular/Lymphatic: No evidence of abdominal aortic aneurysm. No suspicious abdominopelvic lymphadenopathy. Reproductive: Status post hysterectomy. No adnexal masses. Other: No abdominopelvic ascites.  No free air. Musculoskeletal: Visualized osseous structures are within normal limits.  IMPRESSION: Moderate hydronephrosis involving the right lower quadrant renal transplant, new. Associated enlargement of the transplant kidney when compared to the prior. Emergent urology consult is suggested. Improving postoperative seroma related to prior cholecystectomy. Additional ancillary findings as above. Electronically Signed   By: Julian Hy M.D.   On: 07/06/2017 11:36   Ct Abdomen Pelvis Wo Contrast  Result Date: 07/02/2017 CLINICAL DATA:  Cholecystectomy 06/30/2017. Possible postoperative complication suspected. Renal transplant 2007. EXAM: CT ABDOMEN AND PELVIS WITHOUT CONTRAST TECHNIQUE: Multidetector CT imaging of the abdomen and pelvis was performed following the standard protocol without IV contrast. COMPARISON:  CT of the abdomen and pelvis 12/10/2016 FINDINGS: Lower chest: There is subsegmental atelectasis at both lung bases. Trace pericardial effusion. Hepatobiliary: The liver is homogeneous without focal abnormality. Surgical clips are seen in the gallbladder fossa region. There is a small fluid collection in the gallbladder fossa, containing air and gas. This is crescentic, measuring 5.7 x 1.4 by 3.2 centimeters. Pancreas: Unremarkable. No pancreatic ductal dilatation or surrounding inflammatory changes. Spleen: Normal in size without focal abnormality. Adrenals/Urinary Tract: Adrenal glands are normal. LEFT kidney is atrophic. RIGHT native kidney is not identified. RIGHT iliac fossa renal transplant appears stable compared to prior study. Transplant  kidney appears enlarged and contains focal low-attenuation areas within the MEDIAL aspect and subcapsular midpole region, all stable findings. There is stable mild prominence of the renal pelvis and stranding in the region of the hilum. Normal appearance of the urinary bladder. Stomach/Bowel: Small hiatal hernia. Stomach is otherwise normal. Small bowel loops are normal in appearance. The appendix is well seen and has a normal appearance. Loops of colon are normal in appearance. Vascular/Lymphatic: No significant vascular findings are present. No enlarged abdominal or pelvic lymph nodes. Reproductive: Hysterectomy.  No adnexal mass. Other: No abdominal wall hernia or abnormality. No abdominopelvic ascites. Postoperative changes are identified in the anterior abdominal wall. Subcutaneous and small amount of intraperitoneal gas remain following recent surgery. Musculoskeletal: No acute or significant osseous findings. IMPRESSION: 1. Crescentic fluid and gas attenuation in the region of the gallbladder fossa, measuring 5.7 x 1.4 x 3.2 centimeters. 2. Stable appearance of RIGHT iliac fossa renal transplant. 3. Small hiatal hernia. 4. Normal appendix. 5. Hysterectomy. 6. Postoperative changes in the anterior abdominal wall. Electronically Signed   By: Nolon Nations M.D.   On: 07/02/2017 19:12   Dg Chest 2 View  Result Date: 07/02/2017 CLINICAL DATA:  Postop cholecystectomy.  Cough. EXAM: CHEST - 2 VIEW COMPARISON:  01/13/2017 FINDINGS: Mild cardiomegaly. Low lung volumes. No confluent opacities or effusions. No acute bony abnormality. IMPRESSION: Mild cardiomegaly.  No active disease. Electronically Signed   By: Rolm Baptise M.D.   On: 07/02/2017 18:13   US Renal Transplant W/doppler  Result Date: 07/14/2017 CLINICAL DATA:  Acute kidney injury EXAM: ULTRASOUND OF RENAL TRANSPLANT WITH RENAL DOPPLER ULTRASOUND TECHNIQUE: Ultrasound examination of the renal transplant was performed with gray-scale, color and  duplex doppler evaluation. COMPARISON:  CT abdomen/pelvis dated 07/06/2017 FINDINGS: Transplant kidney location: Right lower quadrant Transplant Kidney: Length: 15.3. Normal in size and parenchymal echogenicity. 3.0 x 1.7 x 3.0 cm interpolar cyst. No peri-transplant fluid collection seen. Color flow in the main renal artery:  Yes Color flow in the main renal vein:  Yes Duplex Doppler Evaluation: Main Renal Artery Resistive Index: 0.58 Venous waveform in main renal vein:  Present Intrarenal resistive index in upper pole:  0.43 (normal 0.6-0.8; equivocal 0.8-0.9; abnormal >= 0.9) Intrarenal resistive index in lower pole: 0.49 (normal  0.6-0.8; equivocal 0.8-0.9; abnormal >= 0.9) Bladder: Decompressed by indwelling Foley catheter. Other findings:  None. IMPRESSION: Right lower quadrant renal transplant with patent vasculature and normal resistive indices. No hydronephrosis. Bladder decompressed by indwelling Foley catheter. Electronically Signed   By: Julian Hy M.D.   On: 07/14/2017 18:31   Dg Abd Portable 1v  Result Date: 07/13/2017 CLINICAL DATA:  Abdominal pain with nausea and vomiting EXAM: PORTABLE ABDOMEN - 1 VIEW COMPARISON:  CT abdomen and pelvis July 06, 2017 FINDINGS: There is moderate stool in the colon. There is no appreciable bowel dilatation or air-fluid level to suggest bowel obstruction. No free air. There are surgical clips in the right quadrant and right medial pelvis. IMPRESSION: No bowel obstruction or free air.  Moderate stool in colon. Electronically Signed   By: Lowella Grip III M.D.   On: 07/13/2017 13:05   Dg Foot Complete Left  Result Date: 06/21/2017 Please see detailed radiograph report in office note.  Dg Foot Complete Right  Result Date: 06/21/2017 Please see detailed radiograph report in office note.  Dg C-arm 1-60 Min-no Report  Result Date: 06/30/2017 Fluoroscopy was utilized by the requesting physician.  No radiographic interpretation.   US Abdomen Limited  Ruq  Result Date: 06/29/2017 CLINICAL DATA:  RIGHT upper quadrant pain. History of kidney stones, renal transplant, melanoma. EXAM: ULTRASOUND ABDOMEN LIMITED RIGHT UPPER QUADRANT COMPARISON:  Abdominal ultrasound June 28, 2017. CT abdomen and pelvis December 10, 2016. FINDINGS: Gallbladder: Multiple echogenic gallstones with acoustic shadowing measuring to 7 mm. No gallbladder wall thickening or pericholecystic fluid. No sonographic Murphy's sign elicited. Common bile duct: Diameter: 6 mm, top-normal.  No choledocholithiasis. Liver: Two echogenic masses measuring to 2 cm, unchanged from yesterday. Heterogeneous echotexture. Vein is patent on color Doppler imaging with normal direction of blood flow towards the liver. IMPRESSION: 1. Stable cholelithiasis without sonographic findings of acute cholecystitis. 2. Two small echogenic liver lesions could reflect hemangiomas though, would be better characterized on NONEMERGENT MRI with and without contrast, when patient's renal function improves. Electronically Signed   By: Elon Alas M.D.   On: 06/29/2017 17:14   US Abdomen Limited Ruq  Result Date: 06/28/2017 CLINICAL DATA:  Abdominal pain, nausea and vomiting EXAM: ULTRASOUND ABDOMEN LIMITED RIGHT UPPER QUADRANT COMPARISON:  12/10/2016 FINDINGS: Gallbladder: Small echogenic shadowing gallstones, largest measures 7 mm. These appear mobile. No associated wall thickening or pericholecystic fluid. Wall thickness measures 2 mm. No Murphy's sign elicited. Common bile duct: Diameter: 4.5 mm Liver: Mildly heterogeneous increased echogenicity. Slight diffuse intrahepatic biliary prominence, nonspecific. There are 2 adjacent ill-defined echogenic areas or lesions in the right hepatic lobe 1 measuring 1.5 x 1.9 x 1.7 cm and a second measuring 1.5 x 1.4 x 1.8 cm. No available contrast-enhanced studies of the liver to accurately assess for a previous hepatic abnormality. Further evaluation will be recommended with  abdominal MRI without and with contrast. Portal vein is patent on color Doppler imaging with normal direction of blood flow towards the liver. IMPRESSION: Cholelithiasis, measuring up to 7 mm. No signs of cholecystitis. Mildly heterogeneous liver with biliary prominence and 2 ill-defined echogenic right hepatic lesions largest measures 1.9 cm remaining indeterminate. These warrant further evaluation with nonemergent abdominal MRI without and with contrast. Electronically Signed   By: Jerilynn Mages.  Shick M.D.   On: 06/28/2017 14:57     The results of significant diagnostics from this hospitalization (including imaging, microbiology, ancillary and laboratory) are listed below for reference.     Microbiology: Recent Results (  from the past 240 hour(s))  Urine culture     Status: None   Collection Time: 07/13/17 10:07 AM  Result Value Ref Range Status   Specimen Description   Final    URINE, CATHETERIZED Performed at [facility] [address] [place], [place] [address]    Special Requests   Final    NONE Performed at [facility], [place] [address], [place], [place] [address]    Culture   Final    NO GROWTH Performed at [facility], [place] [address] [place], [place] [address]    Report Status 07/15/2017 FINAL  Final     Labs: BNP (last 3 results) No results for input(s): BNP in the last 8760 hours. Basic Metabolic Panel: Recent Labs  Lab 07/13/17 1005 07/14/17 0522 07/15/17 0531  NA 139 139 139  K 4.5 4.4 4.9  CL 105 104 108  CO2 20* 23 23  GLUCOSE 249* 272* 193*  BUN 35* 29* 28*  CREATININE 2.40* 2.34* 1.85*  CALCIUM 7.4* 6.8* 6.7*   Liver Function Tests: Recent Labs  Lab 07/13/17 1005  AST 19  ALT 13*  ALKPHOS 66  BILITOT 0.5  PROT 8.2*  ALBUMIN 3.8   Recent Labs  Lab 07/13/17 1005  LIPASE 42   No results for input(s): AMMONIA in the last 168 hours. CBC: Recent Labs  Lab 07/13/17 1005 07/14/17 0522  WBC 13.2* 10.0  HGB  10.5* 9.0*  HCT 33.9* 29.0*  MCV 89.7 90.6  PLT 392 332   Cardiac Enzymes: Recent Labs  Lab 07/13/17 1425  TROPONINI <0.03   BNP: Invalid input(s): POCBNP CBG: Recent Labs  Lab 07/14/17 1150 07/14/17 1647 07/14/17 2146 07/15/17 0730 07/15/17 1129  GLUCAP 171* 105* 214* 190* 173*   D-Dimer No results for input(s): DDIMER in the last 72 hours. Hgb A1c No results for input(s): HGBA1C in the last 72 hours. Lipid Profile No results for input(s): CHOL, HDL, LDLCALC, TRIG, CHOLHDL, LDLDIRECT in the last 72 hours. Thyroid function studies No results for input(s): TSH, T4TOTAL, T3FREE, THYROIDAB in the last 72 hours.  Invalid input(s): FREET3 Anemia work up No results for input(s): VITAMINB12, FOLATE, FERRITIN, TIBC, IRON, RETICCTPCT in the last 72 hours. Urinalysis    Component Value Date/Time   COLORURINE STRAW (A) 07/13/2017 1007   APPEARANCEUR CLOUDY (A) 07/13/2017 1007   LABSPEC 1.010 07/13/2017 1007   PHURINE 5.0 07/13/2017 1007   GLUCOSEU NEGATIVE 07/13/2017 1007   HGBUR SMALL (A) 07/13/2017 1007   HGBUR negative 01/26/2010 1026   BILIRUBINUR NEGATIVE 07/13/2017 1007   BILIRUBINUR NEG 01/01/2011 1050   KETONESUR NEGATIVE 07/13/2017 1007   PROTEINUR 100 (A) 07/13/2017 1007   UROBILINOGEN 0.2 01/09/2015 1333   NITRITE NEGATIVE 07/13/2017 1007   LEUKOCYTESUR MODERATE (A) 07/13/2017 1007   Sepsis Labs Invalid input(s): PROCALCITONIN,  WBC,  LACTICIDVEN Microbiology Recent Results (from the past 240 hour(s))  Urine culture     Status: None   Collection Time: 07/13/17 10:07 AM  Result Value Ref Range Status   Specimen Description   Final    URINE, CATHETERIZED Performed at [facility], [place] [address] [place], [place] [address]    Special Requests   Final    NONE Performed at [facility], [place] [address] [place], [place] [address]    Culture   Final    NO GROWTH Performed at [facility], [place] [address]  [place], [place] [address]  Report Status 07/15/2017 FINAL  Final     Time coordinating discharge: 8  SIGNED:   Debbe Odea, MD  [facility] 07/15/2017, 1:06 PM Pager   If 7PM-7AM, please contact night-coverage [email or web address] Password [email or web address]

## 2017-07-15 NOTE — Discharge Instructions (Signed)
Bland Diet A bland diet consists of foods that do not have a lot of fat or fiber. Foods without fat or fiber are easier for the body to digest. They are also less likely to irritate your mouth, throat, stomach, and other parts of your gastrointestinal tract. A bland diet is sometimes called a BRAT diet. What is my plan? Your health care provider or dietitian may recommend specific changes to your diet to prevent and treat your symptoms, such as:  Eating small meals often.  Cooking food until it is soft enough to chew easily.  Chewing your food well.  Drinking fluids slowly.  Not eating foods that are very spicy, sour, or fatty.  Not eating citrus fruits, such as oranges and grapefruit.  What do I need to know about this diet?  Eat a variety of foods from the bland diet food list.  Do not follow a bland diet longer than you have to.  Ask your health care provider whether you should take vitamins. What foods can I eat? Grains  Hot cereals, such as cream of wheat. Bread, crackers, or tortillas made from refined white flour. Rice. Vegetables Canned or cooked vegetables. Mashed or boiled potatoes. Fruits Bananas. Applesauce. Other types of cooked or canned fruit with the skin and seeds removed, such as canned peaches or pears. Meats and Other Protein Sources Scrambled eggs. Creamy peanut butter or other nut butters. Lean, well-cooked meats, such as chicken or fish. Tofu. Soups or broths. Dairy Low-fat dairy products, such as milk, cottage cheese, or yogurt. Beverages Water. Herbal tea. Apple juice. Sweets and Desserts Pudding. Custard. Fruit gelatin. Ice cream. Fats and Oils Mild salad dressings. Canola or olive oil. The items listed above may not be a complete list of allowed foods or beverages. Contact your dietitian for more options. What foods are not recommended? Foods and ingredients that are often not recommended include:  Spicy foods, such as hot sauce or  salsa.  Fried foods.  Sour foods, such as pickled or fermented foods.  Raw vegetables or fruits, especially citrus or berries.  Caffeinated drinks.  Alcohol.  Strongly flavored seasonings or condiments.  The items listed above may not be a complete list of foods and beverages that are not allowed. Contact your dietitian for more information. This information is not intended to replace advice given to you by your health care provider. Make sure you discuss any questions you have with your health care provider. Document Released: 06/23/2015 Document Revised: 08/07/2015 Document Reviewed: 03/13/2014 Elsevier Interactive Patient Education  2018 [employer]  

## 2017-07-19 ENCOUNTER — Ambulatory Visit: Payer: Medicare Other | Admitting: Sports Medicine

## 2017-07-21 ENCOUNTER — Ambulatory Visit (HOSPITAL_COMMUNITY)
Admit: 2017-07-21 | Discharge: 2017-07-21 | Disposition: A | Discharge status: Home / Self Care | Payer: Medicare Other | Source: Ambulatory Visit | Attending: Nephrology | Admitting: Nephrology

## 2017-07-21 VITALS — BP 136/105 | Resp 18

## 2017-07-21 DIAGNOSIS — D631 Anemia in chronic kidney disease: Secondary | ICD-10-CM | POA: Diagnosis not present

## 2017-07-21 DIAGNOSIS — N183 Chronic kidney disease, stage 3 (moderate): Secondary | ICD-10-CM | POA: Insufficient documentation

## 2017-07-21 DIAGNOSIS — Z79899 Other long term (current) drug therapy: Secondary | ICD-10-CM | POA: Insufficient documentation

## 2017-07-21 DIAGNOSIS — N182 Chronic kidney disease, stage 2 (mild): Secondary | ICD-10-CM

## 2017-07-21 DIAGNOSIS — Z5181 Encounter for therapeutic drug level monitoring: Secondary | ICD-10-CM | POA: Insufficient documentation

## 2017-07-21 LAB — POCT HEMOGLOBIN-HEMACUE: HEMOGLOBIN: 11.1 g/dL — AB (ref 12.0–15.0)

## 2017-07-21 MED ORDER — EPOETIN ALFA 20000 UNIT/ML IJ SOLN
INTRAMUSCULAR | Status: AC
  Administered 2017-07-21: 20000 [IU]
  Filled 2017-07-21: qty 1

## 2017-07-21 MED ORDER — EPOETIN ALFA 10000 UNIT/ML IJ SOLN
INTRAMUSCULAR | Status: AC
  Administered 2017-07-21: 10000 [IU]
  Filled 2017-07-21: qty 1

## 2017-07-21 MED ORDER — EPOETIN ALFA 40000 UNIT/ML IJ SOLN: 30000.0000 [IU] | INTRAMUSCULAR | Status: DC

## 2017-07-28 DIAGNOSIS — E1121 Type 2 diabetes mellitus with diabetic nephropathy: Secondary | ICD-10-CM | POA: Diagnosis not present

## 2017-07-28 DIAGNOSIS — I10 Essential (primary) hypertension: Secondary | ICD-10-CM | POA: Diagnosis not present

## 2017-07-28 DIAGNOSIS — Z940 Kidney transplant status: Secondary | ICD-10-CM | POA: Diagnosis not present

## 2017-07-28 DIAGNOSIS — K8050 Calculus of bile duct without cholangitis or cholecystitis without obstruction: Secondary | ICD-10-CM | POA: Diagnosis not present

## 2017-08-03 ENCOUNTER — Encounter (HOSPITAL_COMMUNITY): Payer: Medicare Other

## 2017-08-04 ENCOUNTER — Ambulatory Visit (HOSPITAL_COMMUNITY)
Admission: RE | Admit: 2017-08-04 | Discharge: 2017-08-04 | Disposition: A | Discharge status: Home / Self Care | Payer: Medicare Other | Source: Ambulatory Visit | Attending: Nephrology | Admitting: Nephrology

## 2017-08-04 VITALS — BP 104/87 | HR 98 | Temp 98.4°F | Resp 18

## 2017-08-04 DIAGNOSIS — N182 Chronic kidney disease, stage 2 (mild): Secondary | ICD-10-CM | POA: Insufficient documentation

## 2017-08-04 LAB — POCT HEMOGLOBIN-HEMACUE: HEMOGLOBIN: 10 g/dL — AB (ref 12.0–15.0)

## 2017-08-04 MED ORDER — EPOETIN ALFA 10000 UNIT/ML IJ SOLN
INTRAMUSCULAR | Status: AC
  Administered 2017-08-04: 10000 [IU]
  Filled 2017-08-04: qty 1

## 2017-08-04 MED ORDER — EPOETIN ALFA 40000 UNIT/ML IJ SOLN: 30000.0000 [IU] | INTRAMUSCULAR | Status: DC

## 2017-08-04 MED ORDER — EPOETIN ALFA 20000 UNIT/ML IJ SOLN
INTRAMUSCULAR | Status: AC
  Administered 2017-08-04: 20000 [IU]
  Filled 2017-08-04: qty 1

## 2017-08-09 DIAGNOSIS — E1140 Type 2 diabetes mellitus with diabetic neuropathy, unspecified: Secondary | ICD-10-CM | POA: Diagnosis not present

## 2017-08-09 DIAGNOSIS — T380X5A Adverse effect of glucocorticoids and synthetic analogues, initial encounter: Secondary | ICD-10-CM | POA: Diagnosis not present

## 2017-08-09 DIAGNOSIS — E1122 Type 2 diabetes mellitus with diabetic chronic kidney disease: Secondary | ICD-10-CM | POA: Diagnosis not present

## 2017-08-09 DIAGNOSIS — Z940 Kidney transplant status: Secondary | ICD-10-CM | POA: Diagnosis not present

## 2017-08-09 DIAGNOSIS — Z794 Long term (current) use of insulin: Secondary | ICD-10-CM | POA: Diagnosis not present

## 2017-08-09 DIAGNOSIS — N186 End stage renal disease: Secondary | ICD-10-CM | POA: Diagnosis not present

## 2017-08-10 DIAGNOSIS — N2581 Secondary hyperparathyroidism of renal origin: Secondary | ICD-10-CM | POA: Diagnosis not present

## 2017-08-10 DIAGNOSIS — D631 Anemia in chronic kidney disease: Secondary | ICD-10-CM | POA: Diagnosis not present

## 2017-08-10 DIAGNOSIS — M869 Osteomyelitis, unspecified: Secondary | ICD-10-CM | POA: Diagnosis not present

## 2017-08-10 DIAGNOSIS — K810 Acute cholecystitis: Secondary | ICD-10-CM | POA: Diagnosis not present

## 2017-08-10 DIAGNOSIS — E139 Other specified diabetes mellitus without complications: Secondary | ICD-10-CM | POA: Diagnosis not present

## 2017-08-10 DIAGNOSIS — Z940 Kidney transplant status: Secondary | ICD-10-CM | POA: Diagnosis not present

## 2017-08-10 DIAGNOSIS — Z9889 Other specified postprocedural states: Secondary | ICD-10-CM | POA: Diagnosis not present

## 2017-08-12 ENCOUNTER — Other Ambulatory Visit: Payer: Self-pay | Admitting: Nephrology

## 2017-08-12 DIAGNOSIS — Z940 Kidney transplant status: Secondary | ICD-10-CM

## 2017-08-16 DIAGNOSIS — M869 Osteomyelitis, unspecified: Secondary | ICD-10-CM | POA: Diagnosis not present

## 2017-08-16 DIAGNOSIS — E11621 Type 2 diabetes mellitus with foot ulcer: Secondary | ICD-10-CM | POA: Diagnosis not present

## 2017-08-16 DIAGNOSIS — N281 Cyst of kidney, acquired: Secondary | ICD-10-CM | POA: Diagnosis not present

## 2017-08-16 DIAGNOSIS — E1169 Type 2 diabetes mellitus with other specified complication: Secondary | ICD-10-CM | POA: Diagnosis not present

## 2017-08-16 DIAGNOSIS — Z940 Kidney transplant status: Secondary | ICD-10-CM | POA: Diagnosis not present

## 2017-08-16 DIAGNOSIS — L97509 Non-pressure chronic ulcer of other part of unspecified foot with unspecified severity: Secondary | ICD-10-CM | POA: Diagnosis not present

## 2017-08-18 ENCOUNTER — Other Ambulatory Visit: Payer: Medicare Other

## 2017-08-18 ENCOUNTER — Ambulatory Visit (HOSPITAL_COMMUNITY)
Admission: RE | Admit: 2017-08-18 | Discharge: 2017-08-18 | Disposition: A | Discharge status: Home / Self Care | Payer: Medicare Other | Source: Ambulatory Visit | Attending: Nephrology | Admitting: Nephrology

## 2017-08-18 VITALS — BP 125/99 | HR 102 | Temp 98.7°F | Resp 18

## 2017-08-18 DIAGNOSIS — D631 Anemia in chronic kidney disease: Secondary | ICD-10-CM | POA: Insufficient documentation

## 2017-08-18 DIAGNOSIS — N183 Chronic kidney disease, stage 3 (moderate): Secondary | ICD-10-CM | POA: Diagnosis not present

## 2017-08-18 DIAGNOSIS — N182 Chronic kidney disease, stage 2 (mild): Secondary | ICD-10-CM

## 2017-08-18 LAB — FERRITIN: Ferritin: 826 ng/mL — ABNORMAL HIGH (ref 11–307)

## 2017-08-18 LAB — POCT HEMOGLOBIN-HEMACUE: Hemoglobin: 10.3 g/dL — ABNORMAL LOW (ref 12.0–15.0)

## 2017-08-18 LAB — IRON AND TIBC
IRON: 67 ug/dL (ref 28–170)
Saturation Ratios: 32 % — ABNORMAL HIGH (ref 10.4–31.8)
TIBC: 210 ug/dL — ABNORMAL LOW (ref 250–450)
UIBC: 143 ug/dL

## 2017-08-18 MED ORDER — EPOETIN ALFA 20000 UNIT/ML IJ SOLN
INTRAMUSCULAR | Status: AC
  Administered 2017-08-18: 20000 [IU]
  Filled 2017-08-18: qty 1

## 2017-08-18 MED ORDER — EPOETIN ALFA 10000 UNIT/ML IJ SOLN
INTRAMUSCULAR | Status: AC
  Administered 2017-08-18: 10000 [IU]
  Filled 2017-08-18: qty 1

## 2017-08-18 MED ORDER — EPOETIN ALFA 40000 UNIT/ML IJ SOLN: 30000.0000 [IU] | INTRAMUSCULAR | Status: DC

## 2017-09-01 ENCOUNTER — Ambulatory Visit (HOSPITAL_COMMUNITY)
Admission: RE | Admit: 2017-09-01 | Discharge: 2017-09-01 | Disposition: A | Discharge status: Home / Self Care | Payer: Medicare Other | Source: Ambulatory Visit | Attending: Nephrology | Admitting: Nephrology

## 2017-09-01 VITALS — BP 127/98 | HR 100 | Temp 98.7°F | Resp 18

## 2017-09-01 DIAGNOSIS — D631 Anemia in chronic kidney disease: Secondary | ICD-10-CM | POA: Insufficient documentation

## 2017-09-01 DIAGNOSIS — N183 Chronic kidney disease, stage 3 (moderate): Secondary | ICD-10-CM | POA: Diagnosis not present

## 2017-09-01 DIAGNOSIS — N182 Chronic kidney disease, stage 2 (mild): Secondary | ICD-10-CM

## 2017-09-01 LAB — POCT HEMOGLOBIN-HEMACUE: Hemoglobin: 11.1 g/dL — ABNORMAL LOW (ref 12.0–15.0)

## 2017-09-01 MED ORDER — EPOETIN ALFA 10000 UNIT/ML IJ SOLN
INTRAMUSCULAR | Status: AC
  Administered 2017-09-01: 10000 [IU]
  Filled 2017-09-01: qty 1

## 2017-09-01 MED ORDER — EPOETIN ALFA 40000 UNIT/ML IJ SOLN: 30000.0000 [IU] | INTRAMUSCULAR | Status: DC

## 2017-09-01 MED ORDER — EPOETIN ALFA 20000 UNIT/ML IJ SOLN
INTRAMUSCULAR | Status: AC
  Administered 2017-09-01: 20000 [IU]
  Filled 2017-09-01: qty 1

## 2017-09-07 DIAGNOSIS — K810 Acute cholecystitis: Secondary | ICD-10-CM | POA: Diagnosis not present

## 2017-09-07 DIAGNOSIS — D631 Anemia in chronic kidney disease: Secondary | ICD-10-CM | POA: Diagnosis not present

## 2017-09-07 DIAGNOSIS — Z9889 Other specified postprocedural states: Secondary | ICD-10-CM | POA: Diagnosis not present

## 2017-09-07 DIAGNOSIS — E872 Acidosis: Secondary | ICD-10-CM | POA: Diagnosis not present

## 2017-09-07 DIAGNOSIS — E139 Other specified diabetes mellitus without complications: Secondary | ICD-10-CM | POA: Diagnosis not present

## 2017-09-07 DIAGNOSIS — Z940 Kidney transplant status: Secondary | ICD-10-CM | POA: Diagnosis not present

## 2017-09-08 DIAGNOSIS — I10 Essential (primary) hypertension: Secondary | ICD-10-CM | POA: Diagnosis not present

## 2017-09-08 DIAGNOSIS — E1121 Type 2 diabetes mellitus with diabetic nephropathy: Secondary | ICD-10-CM | POA: Diagnosis not present

## 2017-09-08 DIAGNOSIS — Z940 Kidney transplant status: Secondary | ICD-10-CM | POA: Diagnosis not present

## 2017-09-13 ENCOUNTER — Ambulatory Visit (HOSPITAL_COMMUNITY)
Admission: RE | Admit: 2017-09-13 | Discharge: 2017-09-13 | Disposition: A | Discharge status: Home / Self Care | Payer: Medicare Other | Source: Ambulatory Visit | Attending: Nephrology | Admitting: Nephrology

## 2017-09-13 VITALS — BP 158/119 | HR 99 | Temp 98.5°F

## 2017-09-13 DIAGNOSIS — Z79899 Other long term (current) drug therapy: Secondary | ICD-10-CM | POA: Diagnosis not present

## 2017-09-13 DIAGNOSIS — Z5181 Encounter for therapeutic drug level monitoring: Secondary | ICD-10-CM | POA: Diagnosis not present

## 2017-09-13 DIAGNOSIS — D631 Anemia in chronic kidney disease: Secondary | ICD-10-CM | POA: Insufficient documentation

## 2017-09-13 DIAGNOSIS — N183 Chronic kidney disease, stage 3 (moderate): Secondary | ICD-10-CM | POA: Diagnosis not present

## 2017-09-13 DIAGNOSIS — N182 Chronic kidney disease, stage 2 (mild): Secondary | ICD-10-CM

## 2017-09-13 LAB — IRON AND TIBC
Iron: 47 ug/dL (ref 28–170)
SATURATION RATIOS: 22 % (ref 10.4–31.8)
TIBC: 211 ug/dL — ABNORMAL LOW (ref 250–450)
UIBC: 164 ug/dL

## 2017-09-13 LAB — FERRITIN: Ferritin: 702 ng/mL — ABNORMAL HIGH (ref 11–307)

## 2017-09-13 LAB — POCT HEMOGLOBIN-HEMACUE: HEMOGLOBIN: 11 g/dL — AB (ref 12.0–15.0)

## 2017-09-13 MED ORDER — EPOETIN ALFA 10000 UNIT/ML IJ SOLN
INTRAMUSCULAR | Status: AC
  Administered 2017-09-13: 10000 [IU] via SUBCUTANEOUS
  Filled 2017-09-13: qty 1

## 2017-09-13 MED ORDER — EPOETIN ALFA 20000 UNIT/ML IJ SOLN
INTRAMUSCULAR | Status: AC
  Administered 2017-09-13: 20000 [IU] via SUBCUTANEOUS
  Filled 2017-09-13: qty 1

## 2017-09-13 MED ORDER — EPOETIN ALFA 40000 UNIT/ML IJ SOLN: 30000.0000 [IU] | INTRAMUSCULAR | Status: DC

## 2017-09-14 ENCOUNTER — Encounter (HOSPITAL_COMMUNITY): Payer: Medicare Other

## 2017-09-26 DIAGNOSIS — Z940 Kidney transplant status: Secondary | ICD-10-CM | POA: Diagnosis not present

## 2017-09-27 ENCOUNTER — Encounter (HOSPITAL_COMMUNITY): Payer: Medicare Other

## 2017-09-27 DIAGNOSIS — D631 Anemia in chronic kidney disease: Secondary | ICD-10-CM | POA: Diagnosis not present

## 2017-09-27 DIAGNOSIS — E139 Other specified diabetes mellitus without complications: Secondary | ICD-10-CM | POA: Diagnosis not present

## 2017-09-27 DIAGNOSIS — E872 Acidosis: Secondary | ICD-10-CM | POA: Diagnosis not present

## 2017-09-27 DIAGNOSIS — Z940 Kidney transplant status: Secondary | ICD-10-CM | POA: Diagnosis not present

## 2017-09-27 DIAGNOSIS — Z9889 Other specified postprocedural states: Secondary | ICD-10-CM | POA: Diagnosis not present

## 2017-09-28 ENCOUNTER — Other Ambulatory Visit (HOSPITAL_COMMUNITY): Payer: Self-pay

## 2017-09-29 ENCOUNTER — Encounter (HOSPITAL_COMMUNITY): Payer: Medicare Other

## 2017-09-29 ENCOUNTER — Ambulatory Visit (HOSPITAL_COMMUNITY)
Admission: RE | Admit: 2017-09-29 | Discharge: 2017-09-29 | Disposition: A | Discharge status: Home / Self Care | Payer: Medicare Other | Source: Ambulatory Visit | Attending: Nephrology | Admitting: Nephrology

## 2017-09-29 VITALS — BP 133/105 | HR 96 | Temp 98.4°F | Resp 18

## 2017-09-29 DIAGNOSIS — D631 Anemia in chronic kidney disease: Secondary | ICD-10-CM | POA: Insufficient documentation

## 2017-09-29 DIAGNOSIS — N183 Chronic kidney disease, stage 3 (moderate): Secondary | ICD-10-CM | POA: Diagnosis not present

## 2017-09-29 DIAGNOSIS — N182 Chronic kidney disease, stage 2 (mild): Secondary | ICD-10-CM

## 2017-09-29 LAB — POCT HEMOGLOBIN-HEMACUE: Hemoglobin: 10.8 g/dL — ABNORMAL LOW (ref 12.0–15.0)

## 2017-09-29 MED ORDER — EPOETIN ALFA 10000 UNIT/ML IJ SOLN
INTRAMUSCULAR | Status: AC
  Administered 2017-09-29: 10000 [IU] via SUBCUTANEOUS
  Filled 2017-09-29: qty 1

## 2017-09-29 MED ORDER — EPOETIN ALFA 20000 UNIT/ML IJ SOLN
INTRAMUSCULAR | Status: AC
  Administered 2017-09-29: 20000 [IU] via SUBCUTANEOUS
  Filled 2017-09-29: qty 1

## 2017-09-29 MED ORDER — EPOETIN ALFA 40000 UNIT/ML IJ SOLN: 30000.0000 [IU] | INTRAMUSCULAR | Status: DC

## 2017-09-30 DIAGNOSIS — R809 Proteinuria, unspecified: Secondary | ICD-10-CM | POA: Diagnosis not present

## 2017-09-30 DIAGNOSIS — E119 Type 2 diabetes mellitus without complications: Secondary | ICD-10-CM | POA: Diagnosis not present

## 2017-09-30 DIAGNOSIS — Z940 Kidney transplant status: Secondary | ICD-10-CM | POA: Diagnosis not present

## 2017-09-30 DIAGNOSIS — N184 Chronic kidney disease, stage 4 (severe): Secondary | ICD-10-CM | POA: Diagnosis not present

## 2017-10-06 DIAGNOSIS — N2581 Secondary hyperparathyroidism of renal origin: Secondary | ICD-10-CM | POA: Diagnosis not present

## 2017-10-06 DIAGNOSIS — T8619 Other complication of kidney transplant: Secondary | ICD-10-CM | POA: Diagnosis not present

## 2017-10-06 DIAGNOSIS — N269 Renal sclerosis, unspecified: Secondary | ICD-10-CM | POA: Diagnosis not present

## 2017-10-06 DIAGNOSIS — E1122 Type 2 diabetes mellitus with diabetic chronic kidney disease: Secondary | ICD-10-CM | POA: Diagnosis not present

## 2017-10-06 DIAGNOSIS — N184 Chronic kidney disease, stage 4 (severe): Secondary | ICD-10-CM | POA: Diagnosis not present

## 2017-10-06 DIAGNOSIS — E1165 Type 2 diabetes mellitus with hyperglycemia: Secondary | ICD-10-CM | POA: Diagnosis not present

## 2017-10-06 DIAGNOSIS — I129 Hypertensive chronic kidney disease with stage 1 through stage 4 chronic kidney disease, or unspecified chronic kidney disease: Secondary | ICD-10-CM | POA: Diagnosis not present

## 2017-10-06 DIAGNOSIS — T8611 Kidney transplant rejection: Secondary | ICD-10-CM | POA: Diagnosis not present

## 2017-10-06 DIAGNOSIS — N261 Atrophy of kidney (terminal): Secondary | ICD-10-CM | POA: Diagnosis not present

## 2017-10-06 DIAGNOSIS — N051 Unspecified nephritic syndrome with focal and segmental glomerular lesions: Secondary | ICD-10-CM | POA: Diagnosis not present

## 2017-10-06 DIAGNOSIS — N179 Acute kidney failure, unspecified: Secondary | ICD-10-CM | POA: Diagnosis not present

## 2017-10-06 DIAGNOSIS — Z79899 Other long term (current) drug therapy: Secondary | ICD-10-CM | POA: Diagnosis not present

## 2017-10-06 DIAGNOSIS — R809 Proteinuria, unspecified: Secondary | ICD-10-CM | POA: Diagnosis not present

## 2017-10-06 DIAGNOSIS — Z940 Kidney transplant status: Secondary | ICD-10-CM | POA: Diagnosis not present

## 2017-10-06 DIAGNOSIS — Z794 Long term (current) use of insulin: Secondary | ICD-10-CM | POA: Diagnosis not present

## 2017-10-07 DIAGNOSIS — T8619 Other complication of kidney transplant: Secondary | ICD-10-CM | POA: Diagnosis not present

## 2017-10-07 DIAGNOSIS — E1122 Type 2 diabetes mellitus with diabetic chronic kidney disease: Secondary | ICD-10-CM | POA: Diagnosis not present

## 2017-10-07 DIAGNOSIS — N261 Atrophy of kidney (terminal): Secondary | ICD-10-CM | POA: Diagnosis not present

## 2017-10-07 DIAGNOSIS — N269 Renal sclerosis, unspecified: Secondary | ICD-10-CM | POA: Diagnosis not present

## 2017-10-07 DIAGNOSIS — I129 Hypertensive chronic kidney disease with stage 1 through stage 4 chronic kidney disease, or unspecified chronic kidney disease: Secondary | ICD-10-CM | POA: Diagnosis not present

## 2017-10-07 DIAGNOSIS — N051 Unspecified nephritic syndrome with focal and segmental glomerular lesions: Secondary | ICD-10-CM | POA: Diagnosis not present

## 2017-10-07 DIAGNOSIS — Z940 Kidney transplant status: Secondary | ICD-10-CM | POA: Diagnosis not present

## 2017-10-07 DIAGNOSIS — N179 Acute kidney failure, unspecified: Secondary | ICD-10-CM | POA: Diagnosis not present

## 2017-10-07 DIAGNOSIS — T8611 Kidney transplant rejection: Secondary | ICD-10-CM | POA: Diagnosis not present

## 2017-10-13 ENCOUNTER — Ambulatory Visit (HOSPITAL_COMMUNITY)
Admission: RE | Admit: 2017-10-13 | Discharge: 2017-10-13 | Disposition: A | Discharge status: Home / Self Care | Payer: Medicare Other | Source: Ambulatory Visit | Attending: Nephrology | Admitting: Nephrology

## 2017-10-13 VITALS — BP 140/92 | HR 100 | Temp 98.4°F | Resp 18

## 2017-10-13 DIAGNOSIS — N183 Chronic kidney disease, stage 3 (moderate): Secondary | ICD-10-CM | POA: Diagnosis not present

## 2017-10-13 DIAGNOSIS — D631 Anemia in chronic kidney disease: Secondary | ICD-10-CM | POA: Diagnosis not present

## 2017-10-13 DIAGNOSIS — N182 Chronic kidney disease, stage 2 (mild): Secondary | ICD-10-CM

## 2017-10-13 LAB — IRON AND TIBC
IRON: 77 ug/dL (ref 28–170)
Saturation Ratios: 33 % — ABNORMAL HIGH (ref 10.4–31.8)
TIBC: 231 ug/dL — AB (ref 250–450)
UIBC: 154 ug/dL

## 2017-10-13 LAB — FERRITIN: Ferritin: 711 ng/mL — ABNORMAL HIGH (ref 11–307)

## 2017-10-13 LAB — POCT HEMOGLOBIN-HEMACUE: HEMOGLOBIN: 11.9 g/dL — AB (ref 12.0–15.0)

## 2017-10-13 MED ORDER — EPOETIN ALFA 10000 UNIT/ML IJ SOLN
INTRAMUSCULAR | Status: AC
Start: 2017-10-13 — End: 2017-10-13
  Administered 2017-10-13: 10000 [IU] via SUBCUTANEOUS
  Filled 2017-10-13: qty 1

## 2017-10-13 MED ORDER — EPOETIN ALFA 40000 UNIT/ML IJ SOLN: 30000.0000 [IU] | INTRAMUSCULAR | Status: DC

## 2017-10-13 MED ORDER — EPOETIN ALFA 20000 UNIT/ML IJ SOLN
INTRAMUSCULAR | Status: AC
Start: 2017-10-13 — End: 2017-10-13
  Administered 2017-10-13: 20000 [IU] via SUBCUTANEOUS
  Filled 2017-10-13: qty 1

## 2017-10-17 ENCOUNTER — Observation Stay (HOSPITAL_COMMUNITY)
Admission: EM | Admit: 2017-10-17 | Discharge: 2017-10-18 | Disposition: A | Discharge status: Home / Self Care | Payer: Medicare Other | Attending: Internal Medicine | Admitting: Internal Medicine

## 2017-10-17 ENCOUNTER — Encounter (HOSPITAL_COMMUNITY): Payer: Self-pay

## 2017-10-17 ENCOUNTER — Other Ambulatory Visit: Payer: Self-pay

## 2017-10-17 DIAGNOSIS — E875 Hyperkalemia: Secondary | ICD-10-CM | POA: Diagnosis not present

## 2017-10-17 DIAGNOSIS — N182 Chronic kidney disease, stage 2 (mild): Secondary | ICD-10-CM | POA: Insufficient documentation

## 2017-10-17 DIAGNOSIS — E1040 Type 1 diabetes mellitus with diabetic neuropathy, unspecified: Secondary | ICD-10-CM | POA: Insufficient documentation

## 2017-10-17 DIAGNOSIS — R9349 Abnormal radiologic findings on diagnostic imaging of other urinary organs: Secondary | ICD-10-CM | POA: Diagnosis not present

## 2017-10-17 DIAGNOSIS — E1010 Type 1 diabetes mellitus with ketoacidosis without coma: Principal | ICD-10-CM | POA: Insufficient documentation

## 2017-10-17 DIAGNOSIS — E872 Acidosis: Secondary | ICD-10-CM | POA: Diagnosis not present

## 2017-10-17 DIAGNOSIS — E860 Dehydration: Secondary | ICD-10-CM | POA: Diagnosis not present

## 2017-10-17 DIAGNOSIS — Q970 Karyotype 47, XXX: Secondary | ICD-10-CM | POA: Diagnosis not present

## 2017-10-17 DIAGNOSIS — D631 Anemia in chronic kidney disease: Secondary | ICD-10-CM | POA: Insufficient documentation

## 2017-10-17 DIAGNOSIS — N186 End stage renal disease: Secondary | ICD-10-CM | POA: Diagnosis present

## 2017-10-17 DIAGNOSIS — N189 Chronic kidney disease, unspecified: Secondary | ICD-10-CM | POA: Diagnosis not present

## 2017-10-17 DIAGNOSIS — I129 Hypertensive chronic kidney disease with stage 1 through stage 4 chronic kidney disease, or unspecified chronic kidney disease: Secondary | ICD-10-CM | POA: Diagnosis not present

## 2017-10-17 DIAGNOSIS — E1022 Type 1 diabetes mellitus with diabetic chronic kidney disease: Secondary | ICD-10-CM | POA: Diagnosis not present

## 2017-10-17 DIAGNOSIS — B9789 Other viral agents as the cause of diseases classified elsewhere: Secondary | ICD-10-CM | POA: Diagnosis not present

## 2017-10-17 DIAGNOSIS — Z940 Kidney transplant status: Secondary | ICD-10-CM | POA: Diagnosis not present

## 2017-10-17 DIAGNOSIS — N179 Acute kidney failure, unspecified: Secondary | ICD-10-CM | POA: Insufficient documentation

## 2017-10-17 DIAGNOSIS — Z79899 Other long term (current) drug therapy: Secondary | ICD-10-CM | POA: Insufficient documentation

## 2017-10-17 DIAGNOSIS — K219 Gastro-esophageal reflux disease without esophagitis: Secondary | ICD-10-CM | POA: Insufficient documentation

## 2017-10-17 DIAGNOSIS — E119 Type 2 diabetes mellitus without complications: Secondary | ICD-10-CM | POA: Diagnosis not present

## 2017-10-17 DIAGNOSIS — N184 Chronic kidney disease, stage 4 (severe): Secondary | ICD-10-CM | POA: Diagnosis not present

## 2017-10-17 DIAGNOSIS — N2581 Secondary hyperparathyroidism of renal origin: Secondary | ICD-10-CM | POA: Insufficient documentation

## 2017-10-17 DIAGNOSIS — E1110 Type 2 diabetes mellitus with ketoacidosis without coma: Secondary | ICD-10-CM | POA: Diagnosis present

## 2017-10-17 DIAGNOSIS — E8770 Fluid overload, unspecified: Secondary | ICD-10-CM | POA: Diagnosis not present

## 2017-10-17 LAB — BASIC METABOLIC PANEL
ANION GAP: 7 (ref 5–15)
BUN: 77 mg/dL — ABNORMAL HIGH (ref 6–20)
CO2: 15 mmol/L — ABNORMAL LOW (ref 22–32)
Calcium: 7.3 mg/dL — ABNORMAL LOW (ref 8.9–10.3)
Chloride: 113 mmol/L — ABNORMAL HIGH (ref 98–111)
Creatinine, Ser: 2.77 mg/dL — ABNORMAL HIGH (ref 0.44–1.00)
GFR calc Af Amer: 25 mL/min — ABNORMAL LOW (ref >60)
GFR, EST NON AFRICAN AMERICAN: 21 mL/min — AB (ref >60)
GLUCOSE: 491 mg/dL — AB (ref 70–99)
POTASSIUM: 5.8 mmol/L — AB (ref 3.5–5.1)
Sodium: 135 mmol/L (ref 135–145)

## 2017-10-17 LAB — I-STAT BETA HCG BLOOD, ED (MC, WL, AP ONLY): I-stat hCG, quantitative: 6.4 m[IU]/mL — ABNORMAL HIGH (ref <5)

## 2017-10-17 LAB — CBC
HEMATOCRIT: 36.4 % (ref 36.0–46.0)
HEMOGLOBIN: 11.6 g/dL — AB (ref 12.0–15.0)
MCH: 29.4 pg (ref 26.0–34.0)
MCHC: 31.9 g/dL (ref 30.0–36.0)
MCV: 92.2 fL (ref 78.0–100.0)
Platelets: 302 10*3/uL (ref 150–400)
RBC: 3.95 MIL/uL (ref 3.87–5.11)
RDW: 15.6 % — AB (ref 11.5–15.5)
WBC: 11.5 10*3/uL — AB (ref 4.0–10.5)

## 2017-10-17 MED ORDER — SODIUM CHLORIDE 0.9 % IV BOLUS
1000.0000 mL | Freq: Once | INTRAVENOUS | Status: AC
  Administered 2017-10-17: 1000 mL via INTRAVENOUS

## 2017-10-17 MED ORDER — SODIUM CHLORIDE 0.9 % IV BOLUS
1000.0000 mL | Freq: Once | INTRAVENOUS | Status: DC
Start: 2017-10-17 — End: 2017-10-17

## 2017-10-17 MED ORDER — DEXTROSE-NACL 5-0.45 % IV SOLN: INTRAVENOUS | Status: DC

## 2017-10-17 MED ORDER — SODIUM CHLORIDE 0.9 % IV BOLUS: 1000.0000 mL | Freq: Once | INTRAVENOUS | Status: DC

## 2017-10-17 MED ORDER — SODIUM CHLORIDE 0.9 % IV SOLN
INTRAVENOUS | Status: DC
  Administered 2017-10-18: 4.3 [IU]/h via INTRAVENOUS
  Filled 2017-10-17: qty 1

## 2017-10-17 MED ORDER — SODIUM CHLORIDE 0.9 % IV SOLN: INTRAVENOUS | Status: DC

## 2017-10-17 NOTE — ED Provider Notes (Signed)
[facility] DEPT Provider Note   CSN: [id number] Arrival date & time: 10/17/17  2111     History   Chief Complaint Chief Complaint  Patient presents with  . Abnormal Labs    HPI [name] is a 32 y.o. female.  The history is provided by the patient and medical records.    32 y.o. F with hx of XXX syndrome, DM1, HLP, learning disability, chronic kidney disease status post transplant in 2007 in [place], presenting to the ED with abnormal labs.  Patient reports she recently had follow-up with her nephrologist and was notified that her potassium was high and her bicarb was low was told to come to the ED-- she is not sure of the exact numbers.  States her blood sugars have been running higher than normal lately, in the 400 range.  States she is usually around 150 or so.  States her nephrologist recently increased her prednisone dosing as she had a renal biopsy which showed some early signs of rejection.  States her primary care doctor did adjust her insulin dosing to accommodate for this but sugars have still been high.  States she has noticed some urinary frequency and slightly labored breathing but denies any cough, fever, or chest pain.  States she follows with [facility], Dr. Lorrene Reid.  Past Medical History:  Diagnosis Date  . Chronic kidney disease   . Diabetes mellitus    Pt reports diagnosis in June 2011  . Hyperlipidemia   . Hypertension   . Kidney transplant recipient   . 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 Pyelonephritis 06/23/2014  . UTI (urinary tract infection) 01/09/2015  . 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 AKI (acute kidney injury) ([place]) 07/13/2017  . Acute lower UTI 07/13/2017  . Constipation 07/13/2017  . Hematemesis 07/02/2017  .  Dehydration 07/02/2017  . Chronic cholecystitis 06/29/2017  . Type 1 diabetes mellitus with complication, uncontrolled ([place]) 05/25/2015  . Intractable nausea and vomiting 01/09/2015  . Renal transplant recipient   . Immunosuppressed status ([place])   . 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 CHRONIC KIDNEY DISEASE STAGE II (MILD) 01/04/2010  . OVARIAN FAILURE, PREMATURE 03/11/2009  . 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 SECONDARY HYPERPARATHYROIDISM 12/05/2007  . OBESITY 09/25/2007  . Anemia in chronic renal disease 09/25/2007  . LEARNING DISABILITY 09/25/2007  . Essential hypertension, benign 09/25/2007  . KIDNEY TRANSPLANTATION, HX OF 09/25/2007    Past Surgical History:  Procedure Laterality Date  . CHOLECYSTECTOMY N/A 06/30/2017   Procedure: LAPAROSCOPIC CHOLECYSTECTOMY WITH INTRAOPERATIVE CHOLANGIOGRAM;  Surgeon: Excell Seltzer, MD;  Location: [facility];  Service: General;  Laterality: N/A;  . ESOPHAGOGASTRODUODENOSCOPY (EGD) WITH PROPOFOL N/A 07/04/2017   Procedure: ESOPHAGOGASTRODUODENOSCOPY (EGD) WITH PROPOFOL;  Surgeon: Clarene Essex, MD;  Location: [facility];  Service: Endoscopy;  Laterality: N/A;  . KIDNEY TRANSPLANT  2007  . RENAL BIOPSY Bilateral 2003  . THYROID LOBECTOMY       OB History   None      Home Medications    Prior to Admission medications   Medication Sig Start Date End Date Taking? Authorizing Provider  ACCU-CHEK SOFTCLIX LANCETS lancets Use to check blood sugar 4 times per day. 12/29/15   Renato Shin, MD  acetaminophen (TYLENOL)  500 MG tablet Take 1,000 mg by mouth daily as needed for moderate pain.     [provider]  cetirizine (ZYRTEC) 10 MG tablet Take 10 mg by mouth daily.    [provider]  docusate sodium (COLACE) 100 MG capsule Take 1 capsule (100 mg total) by mouth 2 (two) times daily. 07/15/17   Debbe Odea, MD  famotidine (PEPCID) 40 MG tablet Take 1 tablet (40 mg total) by mouth daily. Can take up  to 2 x day (every 12 hrs) 07/15/17   Debbe Odea, MD  fluticasone (FLONASE) 50 MCG/ACT nasal spray Place 2 sprays into both nostrils daily. 07/20/15   Muthersbaugh, Jarrett Soho, PA-C  furosemide (LASIX) 20 MG tablet Take 20 mg by mouth daily as needed. Feet swelling    [provider]  gabapentin (NEURONTIN) 100 MG capsule Take 200 mg by mouth daily as needed (for feet).  11/12/16   [provider]  hydrOXYzine (ATARAX/VISTARIL) 50 MG tablet Take 50 mg by mouth 2 (two) times daily as needed for itching.     [provider]  insulin aspart (NOVOLOG FLEXPEN) 100 UNIT/ML FlexPen Inject 15 Units into the skin 2 (two) times daily with a meal. 81-150=10 units 51-200=11 units 201-250=12 units 251-100=13 units 301-350=14 units >351=15 u Per sliding scale    [provider]  insulin degludec (TRESIBA FLEXTOUCH) 100 UNIT/ML SOPN FlexTouch Pen Inject 1.3 mLs (130 Units total) into the skin daily. And pen needles 1/day Patient taking differently: Inject 60 Units into the skin daily. And pen needles 1/day 07/22/16   Renato Shin, MD  loperamide (IMODIUM) 2 MG capsule Take 2 mg by mouth as needed for diarrhea or loose stools. Take 2 tablets as needed for diarrhea.    [provider]  mycophenolate (MYFORTIC) 360 MG TBEC Take 720 mg by mouth 2 (two) times daily.     [provider]  ondansetron (ZOFRAN ODT) 4 MG disintegrating tablet 4mg  ODT q4 hours prn nausea/vomit 07/06/17   Deno Etienne, DO  predniSONE (DELTASONE) 5 MG tablet Take 1 tablet (5 mg total) by mouth at bedtime. 07/15/17   Debbe Odea, MD  simvastatin (ZOCOR) 20 MG tablet Take 20 mg by mouth 3 (three) times a week. MWF.    [provider]  tacrolimus (PROGRAF) 1 MG capsule Take 2 mg by mouth 2 (two) times daily.     [provider]    Family History Family History  Problem Relation Age of Onset  . Arthritis Mother   . Diabetes Mother   . Hypertension Mother     Social  History Social History   Tobacco Use  . Smoking status: Never Smoker  . Smokeless tobacco: Never Used  Substance Use Topics  . Alcohol use: No  . Drug use: No     Allergies   Benadryl [diphenhydramine-zinc acetate]; Motrin [ibuprofen]; Banana; Diphenhydramine hcl; Doxycycline; Iron dextran; and Shellfish allergy   Review of Systems Review of Systems  Constitutional:       Abnormal labs  All other systems reviewed and are negative.    Physical Exam Updated Vital Signs BP 121/83 (BP Location: Left Arm)   Pulse (!) 110   Temp 98 F (36.7 C) (Oral)   Resp 18   Ht 5\' 6"  (1.676 m)   Wt 78 kg (172 lb)   SpO2 99%   BMI 27.76 kg/m   Physical Exam  Constitutional: She is oriented to person, place, and time. She appears well-developed and well-nourished.  HENT:  Head: Normocephalic and atraumatic.  Mouth/Throat: Oropharynx is clear and moist.  Mildly dry mucous membranes  Eyes: Pupils are equal, round, and reactive to light. Conjunctivae and EOM are normal.  Neck: Normal range of motion.  Cardiovascular: Normal rate, regular rhythm and normal heart sounds.  Pulmonary/Chest: Effort normal and breath sounds normal. No stridor. She has no wheezes. She has no rhonchi.  Abdominal: Soft. Bowel sounds are normal.  Musculoskeletal: Normal range of motion.  Neurological: She is alert and oriented to person, place, and time.  Skin: Skin is warm and dry.  Psychiatric: She has a normal mood and affect.  Nursing note and vitals reviewed.    ED Treatments / Results  Labs (all labs ordered are listed, but only abnormal results are displayed) Labs Reviewed  URINALYSIS, ROUTINE W REFLEX MICROSCOPIC - Abnormal; Notable for the following components:      Result Value   Color, Urine STRAW (*)    Glucose, UA >=500 (*)    Protein, ur 30 (*)    Bacteria, UA RARE (*)    All other components within normal limits  BASIC METABOLIC PANEL - Abnormal; Notable for the following components:    Potassium 5.8 (*)    Chloride 113 (*)    CO2 15 (*)    Glucose, Bld 491 (*)    BUN 77 (*)    Creatinine, Ser 2.77 (*)    Calcium 7.3 (*)    GFR calc non Af Amer 21 (*)    GFR calc Af Amer 25 (*)    All other components within normal limits  CBC - Abnormal; Notable for the following components:   WBC 11.5 (*)    Hemoglobin 11.6 (*)    RDW 15.6 (*)    All other components within normal limits  BLOOD GAS, VENOUS - Abnormal; Notable for the following components:   pH, Ven 7.213 (*)    pCO2, Ven 30.6 (*)    pO2, Ven 90.0 (*)    Bicarbonate 11.8 (*)    Acid-base deficit 14.6 (*)    All other components within normal limits  CBC - Abnormal; Notable for the following components:   WBC 15.3 (*)    Hemoglobin 11.5 (*)    All other components within normal limits  I-STAT BETA HCG BLOOD, ED ([facility], [facility], [facility] ONLY) - Abnormal; Notable for the following components:   I-stat hCG, quantitative 6.4 (*)    All other components within normal limits  CBG MONITORING, ED - Abnormal; Notable for the following components:   Glucose-Capillary 443 (*)    All other components within normal limits  CBG MONITORING, ED - Abnormal; Notable for the following components:   Glucose-Capillary 278 (*)    All other components within normal limits  CBG MONITORING, ED - Abnormal; Notable for the following components:   Glucose-Capillary 144 (*)    All other components within normal limits  CBG MONITORING, ED - Abnormal; Notable for the following components:   Glucose-Capillary 108 (*)    All other components within normal limits  BASIC METABOLIC PANEL  BASIC METABOLIC PANEL  BASIC METABOLIC PANEL  BASIC METABOLIC PANEL  TACROLIMUS LEVEL  MYCOPHENOLIC ACID (CELLCEPT)  CBG MONITORING, ED    EKG None  Radiology US Renal Transplant W/doppler  Result Date: 10/18/2017 CLINICAL DATA:  Acute onset of renal failure. Assess right renal transplant. EXAM: ULTRASOUND OF RENAL TRANSPLANT WITH RENAL DOPPLER ULTRASOUND  TECHNIQUE: Ultrasound examination of the renal transplant was performed with gray-scale,  color and duplex doppler evaluation. COMPARISON:  CT of the abdomen and pelvis performed 07/06/2017 FINDINGS: Transplant kidney location: Right lower quadrant Transplant Kidney: Length: 15.2 cm. Normal in size. Mildly increased parenchymal echogenicity is noted. A 3.3 x 1.9 x 3.0 cm cyst is noted at the upper pole of the transplant kidney. Mild hydronephrosis is noted. No peri-transplant fluid collection seen. Color flow in the main renal artery:  Yes Color flow in the main renal vein:  Yes Duplex Doppler Evaluation: Main Renal Artery Resistive Index: 0.87 Venous waveform in main renal vein:  Present Intrarenal resistive index in upper pole:  0.64 (normal 0.6-0.8; equivocal 0.8-0.9; abnormal >= 0.9) Intrarenal resistive index in lower pole: 0.58 (normal 0.6-0.8; equivocal 0.8-0.9; abnormal >= 0.9) Bladder: Normal for degree of bladder distention. Bilateral ureteral jets are visualized. Other findings: The patient's native left kidney is atrophic and not well seen. IMPRESSION: 1. Mild hydronephrosis noted at the patient's right transplant kidney, of uncertain significance. Would correlate clinically for evidence for obstruction. Ureteral jets are still visualized. 2. Intrarenal resistive indices at the transplant kidney appear within normal limits. 3. 3.3 cm transplant renal cysts noted. 4. Mildly increased renal parenchymal echogenicity may reflect medical renal disease. Electronically Signed   By: Garald Balding M.D.   On: 10/18/2017 04:38    Procedures Procedures (including critical care time)  CRITICAL CARE Performed by: Larene Pickett   Total critical care time: 45 minutes  Critical care time was exclusive of separately billable procedures and treating other patients.  Critical care was necessary to treat or prevent imminent or life-threatening deterioration.  Critical care was time spent personally by me on  the following activities: development of treatment plan with patient and/or surrogate as well as nursing, discussions with consultants, evaluation of patient's response to treatment, examination of patient, obtaining history from patient or surrogate, ordering and performing treatments and interventions, ordering and review of laboratory studies, ordering and review of radiographic studies, pulse oximetry and re-evaluation of patient's condition.   Medications Ordered in ED Medications  0.9 %  sodium chloride infusion ( Intravenous New Bag/Given 10/18/17 0245)  dextrose 5 %-0.45 % sodium chloride infusion ( Intravenous New Bag/Given 10/18/17 0402)  insulin regular (NOVOLIN R,HUMULIN R) 100 Units in sodium chloride 0.9 % 100 mL (1 Units/mL) infusion (0.4 Units/hr Intravenous New Bag/Given 10/18/17 0523)  heparin injection 5,000 Units (has no administration in time range)  sodium chloride 0.9 % bolus 1,000 mL (0 mLs Intravenous Stopped 10/18/17 0250)    And  sodium chloride 0.9 % bolus 1,000 mL (0 mLs Intravenous Stopped 10/18/17 0250)  sodium bicarbonate injection 50 mEq (50 mEq Intravenous Given 10/18/17 0243)  calcium gluconate 1 g in sodium chloride 0.9 % 100 mL IVPB (0 g Intravenous Stopped 10/18/17 0456)  sodium polystyrene (KAYEXALATE) 15 GM/60ML suspension 15 g (15 g Oral Given 10/18/17 0209)     Initial Impression / Assessment and Plan / ED Course  I have reviewed the triage vital signs and the nursing notes.  Pertinent labs & imaging results that were available during my care of the patient were reviewed by me and considered in my medical decision making (see chart for details).  32 year old female with history of type 1 diabetes, chronic kidney disease status post renal transplant 2007, presenting to the ED for abnormal labs.  Seen by nephrology team recently and told her potassium and CO2 were abnormal and she needed to come to the ER.  Reports she recently had kidney biopsy, concern  for some early  signs of rejection was started on high-dose prednisone.  Primary care doctor adjusted some of her home meds try to alleviate kidney burden and to accommodate high-dose steroids.  Reports sugars at home have been elevated into the 400s.  Labs here concerning for DKA.  PH 7.213.  Kidney function up to 2.77 today consistent with AKI.  Potassium is high at 5.8 but there are no acute EKG changes.  She will be started on glucose stabilizer, insulin should slowly correct the potassium.  She will require admission.  Discussed with nephrology team-- feels ok that patient can be admitted here, notify them if any acute changes/worsening renal function.  Patient admitted to hospitalist service, Dr. Maudie Mercury.  Final Clinical Impressions(s) / ED Diagnoses   Final diagnoses:  Diabetic ketoacidosis without coma associated with type 1 diabetes mellitus ([place])  AKI (acute kidney injury) [facility])    ED Discharge Orders    None       Larene Pickett, PA-C 10/18/17 0601    Veryl Speak, MD 10/18/17 [phone]

## 2017-10-17 NOTE — ED Triage Notes (Signed)
Pt presents after being informed of abnormal labs.  Pt reports she was at [facility] and had lab work done.  Sts her potassium was "extremely high and CO2 was low."  Pt doesn't have actual lab values.  Pt had transplant in 2007.

## 2017-10-18 ENCOUNTER — Inpatient Hospital Stay (HOSPITAL_COMMUNITY): Payer: Medicare Other

## 2017-10-18 ENCOUNTER — Encounter (HOSPITAL_COMMUNITY): Payer: Self-pay | Admitting: Internal Medicine

## 2017-10-18 DIAGNOSIS — E872 Acidosis: Secondary | ICD-10-CM | POA: Diagnosis not present

## 2017-10-18 DIAGNOSIS — E8770 Fluid overload, unspecified: Secondary | ICD-10-CM | POA: Diagnosis not present

## 2017-10-18 DIAGNOSIS — N179 Acute kidney failure, unspecified: Secondary | ICD-10-CM | POA: Diagnosis not present

## 2017-10-18 DIAGNOSIS — N189 Chronic kidney disease, unspecified: Secondary | ICD-10-CM | POA: Diagnosis not present

## 2017-10-18 DIAGNOSIS — E875 Hyperkalemia: Secondary | ICD-10-CM

## 2017-10-18 DIAGNOSIS — Z940 Kidney transplant status: Secondary | ICD-10-CM | POA: Diagnosis not present

## 2017-10-18 DIAGNOSIS — E1110 Type 2 diabetes mellitus with ketoacidosis without coma: Secondary | ICD-10-CM | POA: Diagnosis present

## 2017-10-18 DIAGNOSIS — E1010 Type 1 diabetes mellitus with ketoacidosis without coma: Secondary | ICD-10-CM | POA: Diagnosis not present

## 2017-10-18 DIAGNOSIS — I129 Hypertensive chronic kidney disease with stage 1 through stage 4 chronic kidney disease, or unspecified chronic kidney disease: Secondary | ICD-10-CM | POA: Diagnosis not present

## 2017-10-18 LAB — BASIC METABOLIC PANEL
Anion gap: 7 (ref 5–15)
BUN: 62 mg/dL — AB (ref 6–20)
CO2: 16 mmol/L — ABNORMAL LOW (ref 22–32)
Calcium: 7.1 mg/dL — ABNORMAL LOW (ref 8.9–10.3)
Chloride: 121 mmol/L — ABNORMAL HIGH (ref 98–111)
Creatinine, Ser: 2.26 mg/dL — ABNORMAL HIGH (ref 0.44–1.00)
GFR calc Af Amer: 31 mL/min — ABNORMAL LOW (ref >60)
GFR, EST NON AFRICAN AMERICAN: 27 mL/min — AB (ref >60)
Glucose, Bld: 92 mg/dL (ref 70–99)
POTASSIUM: 4.5 mmol/L (ref 3.5–5.1)
Sodium: 144 mmol/L (ref 135–145)

## 2017-10-18 LAB — CBG MONITORING, ED
GLUCOSE-CAPILLARY: 278 mg/dL — AB (ref 70–99)
GLUCOSE-CAPILLARY: 144 mg/dL — AB (ref 70–99)
GLUCOSE-CAPILLARY: 98 mg/dL (ref 70–99)
GLUCOSE-CAPILLARY: 108 mg/dL — AB (ref 70–99)
GLUCOSE-CAPILLARY: 113 mg/dL — AB (ref 70–99)
GLUCOSE-CAPILLARY: 152 mg/dL — AB (ref 70–99)
Glucose-Capillary: 443 mg/dL — ABNORMAL HIGH (ref 70–99)
Glucose-Capillary: 150 mg/dL — ABNORMAL HIGH (ref 70–99)

## 2017-10-18 LAB — CBC
HEMATOCRIT: 36.6 % (ref 36.0–46.0)
Hemoglobin: 11.5 g/dL — ABNORMAL LOW (ref 12.0–15.0)
MCH: 29 pg (ref 26.0–34.0)
MCHC: 31.4 g/dL (ref 30.0–36.0)
MCV: 92.4 fL (ref 78.0–100.0)
PLATELETS: 320 10*3/uL (ref 150–400)
RBC: 3.96 MIL/uL (ref 3.87–5.11)
RDW: 15.4 % (ref 11.5–15.5)
WBC: 15.3 10*3/uL — ABNORMAL HIGH (ref 4.0–10.5)

## 2017-10-18 LAB — BLOOD GAS, VENOUS
Acid-base deficit: 14.6 mmol/L — ABNORMAL HIGH (ref 0.0–2.0)
Bicarbonate: 11.8 mmol/L — ABNORMAL LOW (ref 20.0–28.0)
O2 Saturation: 95.3 %
PCO2 VEN: 30.6 mmHg — AB (ref 44.0–60.0)
PH VEN: 7.213 — AB (ref 7.250–7.430)
PO2 VEN: 90 mmHg — AB (ref 32.0–45.0)
Patient temperature: 98.6

## 2017-10-18 LAB — URINALYSIS, ROUTINE W REFLEX MICROSCOPIC
Bilirubin Urine: NEGATIVE
Glucose, UA: >=500 mg/dL — AB
Hgb urine dipstick: NEGATIVE
KETONES UR: NEGATIVE mg/dL
Leukocytes, UA: NEGATIVE
Nitrite: NEGATIVE
Protein, ur: 30 mg/dL — AB
Specific Gravity, Urine: 1.009 (ref 1.005–1.030)
pH: 5 (ref 5.0–8.0)

## 2017-10-18 MED ORDER — SODIUM CHLORIDE 0.9 % IV BOLUS
500.0000 mL | Freq: Once | INTRAVENOUS | Status: AC
  Administered 2017-10-18: 500 mL via INTRAVENOUS

## 2017-10-18 MED ORDER — SODIUM CHLORIDE 0.9 % IV SOLN
INTRAVENOUS | Status: DC
  Administered 2017-10-18: 4.4 [IU]/h via INTRAVENOUS
  Administered 2017-10-18: 1.7 [IU]/h via INTRAVENOUS
  Administered 2017-10-18: 0.4 [IU]/h via INTRAVENOUS
  Filled 2017-10-18: qty 1

## 2017-10-18 MED ORDER — INSULIN ASPART 100 UNIT/ML ~~LOC~~ SOLN: 0.0000 [IU] | Freq: Every day | SUBCUTANEOUS | Status: DC

## 2017-10-18 MED ORDER — INSULIN GLARGINE 100 UNIT/ML ~~LOC~~ SOLN
20.0000 [IU] | Freq: Every day | SUBCUTANEOUS | Status: DC
  Administered 2017-10-18: 20 [IU] via SUBCUTANEOUS
  Filled 2017-10-18: qty 0.2

## 2017-10-18 MED ORDER — DEXTROSE-NACL 5-0.45 % IV SOLN
INTRAVENOUS | Status: DC
  Administered 2017-10-18: 04:00:00 via INTRAVENOUS

## 2017-10-18 MED ORDER — SODIUM BICARBONATE 8.4 % IV SOLN
50.0000 meq | Freq: Once | INTRAVENOUS | Status: AC
  Administered 2017-10-18: 50 meq via INTRAVENOUS
  Filled 2017-10-18: qty 50

## 2017-10-18 MED ORDER — SODIUM POLYSTYRENE SULFONATE 15 GM/60ML PO SUSP
15.0000 g | Freq: Once | ORAL | Status: AC
  Administered 2017-10-18: 15 g via ORAL
  Filled 2017-10-18: qty 60

## 2017-10-18 MED ORDER — SODIUM CHLORIDE 0.9 % IV SOLN
1.0000 g | Freq: Once | INTRAVENOUS | Status: AC
  Administered 2017-10-18: 1 g via INTRAVENOUS
  Filled 2017-10-18: qty 10

## 2017-10-18 MED ORDER — SODIUM CHLORIDE 0.9 % IV SOLN
INTRAVENOUS | Status: DC
  Administered 2017-10-18: 03:00:00 via INTRAVENOUS

## 2017-10-18 MED ORDER — HEPARIN SODIUM (PORCINE) 5000 UNIT/ML IJ SOLN: 5000.0000 [IU] | Freq: Three times a day (TID) | INTRAMUSCULAR | Status: DC

## 2017-10-18 MED ORDER — INSULIN ASPART 100 UNIT/ML ~~LOC~~ SOLN
0.0000 [IU] | Freq: Three times a day (TID) | SUBCUTANEOUS | Status: DC
  Administered 2017-10-18: 1 [IU] via SUBCUTANEOUS
  Filled 2017-10-18: qty 1

## 2017-10-18 NOTE — H&P (Signed)
TRH H&P   Patient Demographics:    [name], is a 32 y.o. female  MRN: [id number]   DOB - 1985-10-27  Admit Date - 10/17/2017  Outpatient Primary MD for the patient is Valerie Ebbs, MD  Referring MD/NP/PA:     Outpatient Specialists:  [facility] [place], [place]) Dr. Suezanne Jacquet Hippin  [address] [place], [place] [address] [phone] (ph) [phone] (fax)  Patient coming from: home  Chief Complaint  Patient presents with  . Abnormal Labs      HPI:    [name]  is a 32 y.o. female,w XXX syndrome , dm1, hypertension, hyperlipidemia, kidney transplant was told to come to ER for hyperkalemia  .  Told by nephrologist to come to ER for evaluation of elevated potassium.   Pt had kidney biopsy last week and was told had slight rejection. And her prednisone was increased to 10mg  po bid, and tacrolimus was increased to 3mg  po bid, and mycophenolate was kept the same.  Pt was taken off Lasix and pt was taken off lisinopril /hydrochlorothiazide 20/25mg  po qday.  Pt states she was given clonidine for bp control in case her bp went up.   In ER,  Urinalysis  Ketones 30 Ph 7.213  A 135, K 5.8 Glucose 491 Hco3 491 AG  7 Bun 77, Creatinine 2.77  Wbc 11.5, Hgb 11.6, Plt 302  Pt will be admitted for DKA, and ARF, and possibly transplant rejection.       Review of systems:    In addition to the HPI above,  No Fever-chills, No Headache, No changes with Vision or hearing, No problems swallowing food or Liquids, No Chest pain, Cough or Shortness of Breath, No Abdominal pain, No Nausea or Vommitting, Bowel movements are regular, No Blood in stool or Urine, No dysuria, No new skin rashes or bruises, No new joints pains-aches,  No new weakness, tingling, numbness in any extremity, No recent weight gain or loss, No polyuria, polydypsia or  polyphagia, No significant Mental Stressors.  A full 10 point Review of Systems was done, except as stated above, all other Review of Systems were negative.   With Past History of the following :    Past Medical History:  Diagnosis Date  . Chronic kidney disease   . Diabetes mellitus    Pt reports diagnosis in June 2011  . Hyperlipidemia   . Hypertension   . Kidney transplant recipient   . 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 Pyelonephritis 06/23/2014  . UTI (urinary tract infection) 01/09/2015  . 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   Qualifier: Diagnosis of  By: Carlena Sax  MD, Colletta [place]        Past Surgical History:  Procedure Laterality Date  . CHOLECYSTECTOMY N/A 06/30/2017   Procedure: LAPAROSCOPIC CHOLECYSTECTOMY WITH INTRAOPERATIVE CHOLANGIOGRAM;  Surgeon: Excell Seltzer, MD;  Location: [facility];  Service: General;  Laterality: N/A;  . ESOPHAGOGASTRODUODENOSCOPY (EGD) WITH PROPOFOL N/A 07/04/2017   Procedure: ESOPHAGOGASTRODUODENOSCOPY (EGD) WITH PROPOFOL;  Surgeon: Clarene Essex, MD;  Location: [facility];  Service: Endoscopy;  Laterality: N/A;  . KIDNEY TRANSPLANT  2007  . RENAL BIOPSY Bilateral 2003  . THYROID LOBECTOMY        Social History:     Social History   Tobacco Use  . Smoking status: Never Smoker  . Smokeless tobacco: Never Used  Substance Use Topics  . Alcohol use: No     Lives - at home in [facility] - walks by self  Family History :     Family History  Problem Relation Age of Onset  . Arthritis Mother   . Diabetes Mother   . Hypertension Mother       Home Medications:   Prior to Admission medications   Medication Sig Start Date End Date Taking? Authorizing Provider  ACCU-CHEK SOFTCLIX LANCETS lancets Use to check blood sugar 4 times per day. 12/29/15  Yes Renato Shin, MD  acetaminophen (TYLENOL) 500 MG tablet Take 1,000 mg by mouth daily as needed for moderate  pain.    Yes [provider]  cetirizine (ZYRTEC) 10 MG tablet Take 10 mg by mouth daily as needed for allergies.    Yes [provider]  docusate sodium (COLACE) 100 MG capsule Take 1 capsule (100 mg total) by mouth 2 (two) times daily. Patient taking differently: Take 100 mg by mouth 2 (two) times daily as needed for mild constipation.  07/15/17  Yes Debbe Odea, MD  famotidine (PEPCID) 40 MG tablet Take 1 tablet (40 mg total) by mouth daily. Can take up to 2 x day (every 12 hrs) 07/15/17  Yes Rizwan, Eunice Blase, MD  fluticasone (FLONASE) 50 MCG/ACT nasal spray Place 2 sprays into both nostrils daily. Patient taking differently: Place 2 sprays into both nostrils daily as needed for allergies.  07/20/15  Yes Muthersbaugh, Jarrett Soho, PA-C  furosemide (LASIX) 20 MG tablet Take 20 mg by mouth daily as needed. Feet swelling   Yes [provider]  gabapentin (NEURONTIN) 100 MG capsule Take 200 mg by mouth daily as needed (for feet).  11/12/16  Yes [provider]  hydrOXYzine (ATARAX/VISTARIL) 50 MG tablet Take 50 mg by mouth 2 (two) times daily as needed for itching.    Yes [provider]  insulin aspart (NOVOLOG FLEXPEN) 100 UNIT/ML FlexPen Inject 15 Units into the skin 3 (three) times daily with meals. 81-150=10 units 51-200=11 units 201-250=12 units 251-100=13 units 301-350=14 units >351=15 u Per sliding scale   Yes [provider]  insulin degludec (TRESIBA FLEXTOUCH) 100 UNIT/ML SOPN FlexTouch Pen Inject 1.3 mLs (130 Units total) into the skin daily. And pen needles 1/day Patient taking differently: Inject 80 Units into the skin 2 (two) times daily at 8 am and 10 pm. And pen needles 1/day 07/22/16  Yes Renato Shin, MD  loperamide (IMODIUM) 2 MG capsule Take 2 mg by mouth as needed for diarrhea or loose stools. Take 2 tablets as needed for diarrhea.   Yes [provider]  mycophenolate (MYFORTIC) 360 MG TBEC Take 720 mg by mouth 2 (two) times  daily.    Yes [provider]  ondansetron (ZOFRAN ODT) 4 MG disintegrating tablet 4mg  ODT q4 hours prn nausea/vomit Patient taking differently: Take 4 mg by mouth every 4 (four) hours as needed for nausea or vomiting.  07/06/17  Yes Deno Etienne, DO  predniSONE (DELTASONE) 5 MG tablet Take 1 tablet (5 mg total) by mouth at bedtime. Patient taking differently: Take 10 mg by mouth 2 (two) times daily.  07/15/17  Yes Debbe Odea, MD  simvastatin (ZOCOR) 20 MG tablet Take 20 mg by mouth 3 (three) times a week. MWF.   Yes [provider]  tacrolimus (PROGRAF) 1 MG capsule Take 3 mg by mouth 2 (two) times daily.    Yes [provider]     Allergies:     Allergies  Allergen Reactions  . Benadryl [Diphenhydramine-Zinc Acetate] Shortness Of Breath  . Motrin [Ibuprofen] Shortness Of Breath and Itching    Per pt  . Banana Other (See Comments)    Sick on the stomach  . Diphenhydramine Hcl     REACTION: Stopped breathing in ICU  . Doxycycline     Shortness of Breath   . Iron Dextran     REACTION: vein irritation  . Shellfish Allergy Hives     Physical Exam:   Vitals  Blood pressure (!) 129/93, pulse (!) 104, temperature 98 F (36.7 C), temperature source Oral, resp. rate 18, height 5\' 6"  (1.676 m), weight 172 lb (78 kg), SpO2 98 %.   1. General  lying in bed in NAD,   2. Normal affect and insight, Not Suicidal or Homicidal, Awake Alert, Oriented X 3.  3. No F.N deficits, ALL C.Nerves Intact, Strength 5/5 all 4 extremities, Sensation intact all 4 extremities, Plantars down going.  4. Ears and Eyes appear Normal, Conjunctivae clear, PERRLA. Moist Oral Mucosa.  5. Supple Neck, No JVD, No cervical lymphadenopathy appriciated, No Carotid Bruits.  6. Symmetrical Chest wall movement, Good air movement bilaterally, CTAB.  7. RRR, No Gallops, Rubs or Murmurs, No Parasternal Heave.  8. Positive Bowel Sounds, Abdomen Soft, No tenderness, No organomegaly  appriciated,No rebound -guarding or rigidity.  9.  No Cyanosis, Normal Skin Turgor, No Skin Rash or Bruise.  10. Good muscle tone,  joints appear normal , no effusions, Normal ROM.  11. No Palpable Lymph Nodes in Neck or Axillae     Data Review:    CBC Recent Labs  Lab 10/13/17 1112 10/17/17 2209  WBC  --  11.5*  HGB 11.9* 11.6*  HCT  --  36.4  PLT  --  302  MCV  --  92.2  MCH  --  29.4  MCHC  --  31.9  RDW  --  15.6*   ------------------------------------------------------------------------------------------------------------------  Chemistries  Recent Labs  Lab 10/17/17 2209  NA 135  K 5.8*  CL 113*  CO2 15*  GLUCOSE 491*  BUN 77*  CREATININE 2.77*  CALCIUM 7.3*   ------------------------------------------------------------------------------------------------------------------ estimated creatinine clearance is 30.2 mL/min (A) (by C-G formula based on SCr of 2.77 mg/dL (H)). ------------------------------------------------------------------------------------------------------------------ No results for input(s): TSH, T4TOTAL, T3FREE, THYROIDAB in the last 72 hours.  Invalid input(s): FREET3  Coagulation profile No results for input(s): INR, PROTIME in the last 168 hours. ------------------------------------------------------------------------------------------------------------------- No results for input(s): DDIMER in the last 72 hours. -------------------------------------------------------------------------------------------------------------------  Cardiac Enzymes No results for input(s): CKMB, TROPONINI, MYOGLOBIN in the last 168 hours.  Invalid input(s): CK ------------------------------------------------------------------------------------------------------------------ No results found for: BNP   ---------------------------------------------------------------------------------------------------------------  Urinalysis    Component Value  Date/Time   COLORURINE STRAW (A) 10/18/2017 0050   APPEARANCEUR CLEAR 10/18/2017 0050   LABSPEC 1.009 10/18/2017 0050   PHURINE 5.0 10/18/2017 0050   GLUCOSEU >=500 (A) 10/18/2017 0050   HGBUR  NEGATIVE 10/18/2017 0050   HGBUR negative 01/26/2010 1026   BILIRUBINUR NEGATIVE 10/18/2017 0050   BILIRUBINUR NEG 01/01/2011 1050   KETONESUR NEGATIVE 10/18/2017 0050   PROTEINUR 30 (A) 10/18/2017 0050   UROBILINOGEN 0.2 01/09/2015 1333   NITRITE NEGATIVE 10/18/2017 0050   LEUKOCYTESUR NEGATIVE 10/18/2017 0050    ----------------------------------------------------------------------------------------------------------------   Imaging Results:    No results found.    Assessment & Plan:    Principal Problem:   DKA (diabetic ketoacidoses) ([place]) Active Problems:   ARF (acute renal failure) ([facility])   Hyperkalemia  DKA NPO  NS iv Insulin iv Bmp q4h x5  ARF Check urine sodium, urine creatinine, urine eosinophils Renal ultrasound with doppler Ns iv Check cmp in am  Hyperkalemia Calcium gluconate Sodium bicarbonate Kayexalate 15gm po x1 Check cmp in am  H/o renal transplant Cont mycophenolate Cont tacrolimus Cont prednisone  Gerd Cont pepcid 40mg  po qday  Hayfever Cont Flonase Cont Atarax  ? Diabetic neuropathy Cont Gabapentin    DVT Prophylaxis Heparin - - SCDs  AM Labs Ordered, also please review Full Orders  Family Communication: Admission, patients condition and plan of care including tests being ordered have been discussed with the patient  who indicate understanding and agree with the plan and Code Status.  Code Status FULL CODE  Likely DC to  home  Condition GUARDED    Consults called: nephrology consulted by ED  Admission status: inpatient  Time spent in minutes : 70 minutes critical care   Jani Gravel M.D on 10/18/2017 at 1:51 AM  Between 7am to 7pm - Pager - [phone]   After 7pm go to [email or web address] - password [facility]  [facility] -  Office  [phone]

## 2017-10-18 NOTE — ED Notes (Signed)
Latest CBG of 443mg /dl., currently receiving 7.7 units/hr,. Verified with Teri,RN

## 2017-10-18 NOTE — Discharge Summary (Signed)
Physician Discharge Summary  [name] XTA:[id number] DOB: [birth year] DOA: 10/17/2017  PCP: [name], MD  Admit date: 10/17/2017 Discharge date: 10/18/2017  Admitted From: Home Disposition:  Home  Discharge Condition:Stable CODE STATUS:FULL Diet recommendation:  Carb Modified  Brief/Interim Summary:  Admission H and P:  [name]  is a 32 y.o. female,w XXX syndrome , dm1, hypertension, hyperlipidemia, kidney transplant was told to come to ER for hyperkalemia  .  Told by nephrologist to come to ER for evaluation of elevated potassium.  Pt had kidney biopsy last week and was told had slight rejection. And her prednisone was increased to 10mg  po bid, and tacrolimus was increased to 3mg  po bid, and mycophenolate was kept the same.  Pt was taken off Lasix and pt was taken off lisinopril /hydrochlorothiazide 20/25mg  po qday.  Pt states she was given clonidine for bp control in case her bp went up.   Hospital Course: Patient was admitted for the management of possible DKA, acute renal failure and hyperkalemia.    She was treated with insulin drip and IV fluids.  Hyperkalemia resolved with Kayexalate.  This morning her blood sugars were acceptable, she does not have any anion gap.  Kidney function slightly improved with IV fluids.  Nephrology also evaluated her and cleared for discharge.  She needs to continue her home medications for her kidney transplant. She needs to  continue her insulin at home.   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 She is hemodynamically stable for discharge today to home.  Following problems were addressed during her hospitalization:  DKA Suspected on admission.  Treated with IV insulin.  Currently blood sugar stable.  Gap remains closed.  Continue her insulin regimen at home.  ARF Improved with IV fluids she has been found to  have increased creatinine from a baseline due to suspected rejection.  She needs to follow-up with her own nephrologist  in [place].  Hyperkalemia Resolved  H/o renal transplant Cont mycophenolate Cont tacrolimus Cont prednisone  Gerd Cont pepcid 40mg  po qday      Discharge Diagnoses:  Principal Problem:   DKA (diabetic ketoacidoses) ([place]) Active Problems:   ARF (acute renal failure) (HCC)   Hyperkalemia    Discharge Instructions  Discharge Instructions    Diet Carb Modified   Complete by:  As directed    Discharge instructions   Complete by:  As directed    1) Follow up with your PCP and nephrologist. 2) Do a BMP test to check your kidney function in a week.   Increase activity slowly   Complete by:  As directed      Allergies as of 10/18/2017      Reactions   Benadryl [diphenhydramine-zinc Acetate] Shortness Of Breath   Motrin [ibuprofen] Shortness Of Breath, Itching   Per pt   Banana Other (See Comments)   Sick on the stomach   Diphenhydramine Hcl    REACTION: Stopped breathing in ICU   Doxycycline    Shortness of Breath    Iron Dextran    REACTION: vein irritation   Shellfish Allergy Hives      Medication List    TAKE these medications   ACCU-CHEK SOFTCLIX LANCETS lancets Use to check blood sugar 4 times per day.   acetaminophen 500 MG tablet Commonly known as:  TYLENOL Take 1,000 mg by mouth daily as needed for moderate pain.  cetirizine 10 MG tablet Commonly known as:  ZYRTEC Take 10 mg by mouth daily as needed for allergies.   docusate sodium 100 MG capsule Commonly known as:  COLACE Take 1 capsule (100 mg total) by mouth 2 (two) times daily. What changed:    when to take this  reasons to take this   famotidine 40 MG tablet Commonly known as:  PEPCID Take 1 tablet (40 mg total) by mouth daily. Can take up to 2 x day (every 12 hrs)   fluticasone 50 MCG/ACT nasal spray Commonly known as:  FLONASE Place 2 sprays into both  nostrils daily. What changed:    when to take this  reasons to take this   furosemide 20 MG tablet Commonly known as:  LASIX Take 20 mg by mouth daily as needed. Feet swelling   gabapentin 100 MG capsule Commonly known as:  NEURONTIN Take 200 mg by mouth daily as needed (for feet).   hydrOXYzine 50 MG tablet Commonly known as:  ATARAX/VISTARIL Take 50 mg by mouth 2 (two) times daily as needed for itching.   insulin degludec 100 UNIT/ML Sopn FlexTouch Pen Commonly known as:  TRESIBA Inject 80 Units into the skin at bedtime.   loperamide 2 MG capsule Commonly known as:  IMODIUM Take 2 mg by mouth as needed for diarrhea or loose stools. Take 2 tablets as needed for diarrhea.   mycophenolate 360 MG Tbec EC tablet Commonly known as:  MYFORTIC Take 720 mg by mouth 2 (two) times daily.   NOVOLOG FLEXPEN 100 UNIT/ML FlexPen Generic drug:  insulin aspart Inject 15 Units into the skin 3 (three) times daily with meals. 81-150=10 units 51-200=11 units 201-250=12 units 251-100=13 units 301-350=14 units >351=15 u Per sliding scale   ondansetron 4 MG disintegrating tablet Commonly known as:  ZOFRAN ODT 4mg  ODT q4 hours prn nausea/vomit What changed:    how much to take  how to take this  when to take this  reasons to take this  additional instructions   predniSONE 5 MG tablet Commonly known as:  DELTASONE Take 1 tablet (5 mg total) by mouth at bedtime. What changed:    how much to take  when to take this   simvastatin 20 MG tablet Commonly known as:  ZOCOR Take 20 mg by mouth 3 (three) times a week. MWF.   tacrolimus 1 MG capsule Commonly known as:  PROGRAF Take 3 mg by mouth 2 (two) times daily.      Follow-up Information    [name], MD. Schedule an appointment as soon as possible for a visit in 1 week(s).   Specialty:  Internal Medicine Contact information: [place] [address] [phone]          Allergies   Allergen Reactions  . Benadryl [Diphenhydramine-Zinc Acetate] Shortness Of Breath  . Motrin [Ibuprofen] Shortness Of Breath and Itching    Per pt  . Banana Other (See Comments)    Sick on the stomach  . Diphenhydramine Hcl     REACTION: Stopped breathing in ICU  . Doxycycline     Shortness of Breath   . Iron Dextran     REACTION: vein irritation  . Shellfish Allergy Hives    Consultations: Nephrology  Procedures/Studies: US Renal Transplant W/doppler  Result Date: 10/18/2017 CLINICAL DATA:  Acute onset of renal failure. Assess right renal transplant. EXAM: ULTRASOUND OF RENAL TRANSPLANT WITH RENAL DOPPLER ULTRASOUND TECHNIQUE: Ultrasound examination of the renal transplant was performed with gray-scale,  color and duplex doppler evaluation. COMPARISON:  CT of the abdomen and pelvis performed 07/06/2017 FINDINGS: Transplant kidney location: Right lower quadrant Transplant Kidney: Length: 15.2 cm. Normal in size. Mildly increased parenchymal echogenicity is noted. A 3.3 x 1.9 x 3.0 cm cyst is noted at the upper pole of the transplant kidney. Mild hydronephrosis is noted. No peri-transplant fluid collection seen. Color flow in the main renal artery:  Yes Color flow in the main renal vein:  Yes Duplex Doppler Evaluation: Main Renal Artery Resistive Index: 0.87 Venous waveform in main renal vein:  Present Intrarenal resistive index in upper pole:  0.64 (normal 0.6-0.8; equivocal 0.8-0.9; abnormal >= 0.9) Intrarenal resistive index in lower pole: 0.58 (normal 0.6-0.8; equivocal 0.8-0.9; abnormal >= 0.9) Bladder: Normal for degree of bladder distention. Bilateral ureteral jets are visualized. Other findings: The patient's native left kidney is atrophic and not well seen. IMPRESSION: 1. Mild hydronephrosis noted at the patient's right transplant kidney, of uncertain significance. Would correlate clinically for evidence for obstruction. Ureteral jets are still visualized. 2. Intrarenal resistive indices  at the transplant kidney appear within normal limits. 3. 3.3 cm transplant renal cysts noted. 4. Mildly increased renal parenchymal echogenicity may reflect medical renal disease. Electronically Signed   By: Garald Balding M.D.   On: 10/18/2017 04:38       Subjective: Patient seen and examined at bedside this morning in the emergency department.  She desperately wants to go home.  She is hemodynamically stable for discharge.  Nephrology has already evaluated her and cleared for discharge.  Discharge Exam: Vitals:   10/18/17 0900 10/18/17 0930  BP: (!) 132/96 126/84  Pulse: (!) 103 (!) 102  Resp: 20 (!) 21  Temp:    SpO2: 99% 98%   Vitals:   10/18/17 0800 10/18/17 0830 10/18/17 0900 10/18/17 0930  BP: (!) 163/78 122/90 (!) 132/96 126/84  Pulse: (!) 102 99 (!) 103 (!) 102  Resp: (!) 23 20 20  (!) 21  Temp:      TempSrc:      SpO2: 100% 99% 99% 98%  Weight:      Height:        General: Pt is alert, awake, not in acute distress Cardiovascular: RRR, S1/S2 +, no rubs, no gallops Respiratory: CTA bilaterally, no wheezing, no rhonchi Abdominal: Soft, NT, ND, bowel sounds + Extremities: no edema, no cyanosis    The results of significant diagnostics from this hospitalization (including imaging, microbiology, ancillary and laboratory) are listed below for reference.     Microbiology: No results found for this or any previous visit (from the past 240 hour(s)).   Labs: BNP (last 3 results) No results for input(s): BNP in the last 8760 hours. Basic Metabolic Panel: Recent Labs  Lab 10/17/17 2209 10/18/17 0521  NA 135 144  K 5.8* 4.5  CL 113* 121*  CO2 15* 16*  GLUCOSE 491* 92  BUN 77* 62*  CREATININE 2.77* 2.26*  CALCIUM 7.3* 7.1*   Liver Function Tests: No results for input(s): AST, ALT, ALKPHOS, BILITOT, PROT, ALBUMIN in the last 168 hours. No results for input(s): LIPASE, AMYLASE in the last 168 hours. No results for input(s): AMMONIA in the last 168  hours. CBC: Recent Labs  Lab 10/13/17 1112 10/17/17 2209 10/18/17 0521  WBC  --  11.5* 15.3*  HGB 11.9* 11.6* 11.5*  HCT  --  36.4 36.6  MCV  --  92.2 92.4  PLT  --  302 320   Cardiac Enzymes: No results  for input(s): CKTOTAL, CKMB, CKMBINDEX, TROPONINI in the last 168 hours. BNP: Invalid input(s): POCBNP CBG: Recent Labs  Lab 10/18/17 0340 10/18/17 0445 10/18/17 0555 10/18/17 0627 10/18/17 0850  GLUCAP 144* 98 108* 113* 152*   D-Dimer No results for input(s): DDIMER in the last 72 hours. Hgb A1c No results for input(s): HGBA1C in the last 72 hours. Lipid Profile No results for input(s): CHOL, HDL, LDLCALC, TRIG, CHOLHDL, LDLDIRECT in the last 72 hours. Thyroid function studies No results for input(s): TSH, T4TOTAL, T3FREE, THYROIDAB in the last 72 hours.  Invalid input(s): FREET3 Anemia work up No results for input(s): VITAMINB12, FOLATE, FERRITIN, TIBC, IRON, RETICCTPCT in the last 72 hours. Urinalysis    Component Value Date/Time   COLORURINE STRAW (A) 10/18/2017 0050   APPEARANCEUR CLEAR 10/18/2017 0050   LABSPEC 1.009 10/18/2017 0050   PHURINE 5.0 10/18/2017 0050   GLUCOSEU >=500 (A) 10/18/2017 0050   HGBUR NEGATIVE 10/18/2017 0050   HGBUR negative 01/26/2010 1026   BILIRUBINUR NEGATIVE 10/18/2017 0050   BILIRUBINUR NEG 01/01/2011 1050   KETONESUR NEGATIVE 10/18/2017 0050   PROTEINUR 30 (A) 10/18/2017 0050   UROBILINOGEN 0.2 01/09/2015 1333   NITRITE NEGATIVE 10/18/2017 0050   LEUKOCYTESUR NEGATIVE 10/18/2017 0050   Sepsis Labs Invalid input(s): PROCALCITONIN,  WBC,  LACTICIDVEN Microbiology No results found for this or any previous visit (from the past 240 hour(s)).  Please note: You were cared for by a hospitalist during your hospital stay. Once you are discharged, your primary care physician will handle any further medical issues. Please note that NO REFILLS for any discharge medications will be authorized once you are discharged, as it is  imperative that you return to your primary care physician (or establish a relationship with a primary care physician if you do not have one) for your post hospital discharge needs so that they can reassess your need for medications and monitor your lab values.    Time coordinating discharge: 40 minutes  SIGNED:   Shelly Coss, MD  [facility] 10/18/2017, 10:53 AM Pager [phone]  If 7PM-7AM, please contact night-coverage [email or web address] Password [email or web address]

## 2017-10-18 NOTE — Care Management Note (Addendum)
Case Management Note  CM consulted for Code 18.  Pt had further questions for a dietician in relation to food s/p gallbladder removeal, but also needs further education on DM management and food choices.  Pt agreeable to [facility] follow up and confirmed phone number in the computer.  CM over heard pt and primary RN discussing the drink choice of juice and pt did not understand why it was a poor choice at this time.  Consult placed for [facility].  No further CM needs noted at this time.  14:32 Received confirmation from [facility] that pt is not currently showing active on their list.  They are unable to follow up with pt.  Damaris Geers, Benjaman Lobe, RN 10/18/2017, 12:44 PM

## 2017-10-18 NOTE — ED Notes (Signed)
Discharge instructions reviewed with patient. Patient verbalizes understanding. VSS. Patient provided with glass of ice water prior to discharge, per request.

## 2017-10-18 NOTE — Care Management CC44 (Signed)
Condition Code 44 Documentation Completed  Patient Details  Name: [name] MRN: [id number] Date of Birth: [birth year]   Condition Code 44 given:  Yes Patient signature on Condition Code 44 notice:  Yes Documentation of 2 MD's agreement:  Yes Code 44 added to claim:  Yes    Quetzalli Clos, Benjaman Lobe, RN 10/18/2017, 12:42 PM

## 2017-10-18 NOTE — Consult Note (Signed)
Renal Service Consult Note [facility]  [name] 10/18/2017 [name] Requesting Physician:  Dr Tawanna Solo  Reason for Consult:  Renal transplant/ renal failure HPI: The patient is a 32 y.o. year-old with hx of HTN, CKD/ FSGS, DM, recurrent pyelo f/b urology,  and hx of renal transplant at [facility] / [place] in 2007 (on HD from age 70 > 70). Pt admitted on 8/5 for abnormal labs w creat 2.7 (baseline 1.5- 1.9) and high K+.  Patient reportedly had "slight rejection" by kidney biopsy last week and her prednisone was ^'d to 10 mg po bid, prograf ^'d to 33m bid and not change to mycophenolate.  Lasix was held and clonidine started in case BP's went up.  In ED UA showed 30 ketones, blood work showed >>     Na 135  K 5.8  CO2 15  BUN 77  Cr 2.77   Ca 7.3  eGFR 25   WBC 15k  Hb 11.5 plt 320   UA - > 500 glu, 30 ketones, clear, 30 protein, 0-5 wbc/rbc, rare bact    Today creat is down to 2.2.  Got 4.2 L IVF yesterday. UOP not recorded. 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  Creat there was 2.8, they increased her pred to 10 bid and will taper down, increased her prograf to 3 bid and didn't change her myfortic, they also increased her insulin from 60 > 80 units qhs (not bid). Pt was taken off of her lisinopril / hctz about 1 mo ago.      Date   Creat  Note  2009- 16  1.3- 1.8 AKI jul 2016,  3.12 >> 1.95  2017- 2018  1.5- 1.21 Jun 2017  2.3 > 1.5 AKI  may 2019  2.4 > 1.85  AKI  aug 5   2.77  Oct 18, 2017  2.26   echart:  oct 2016 - aki on ckd, flu-like illness after flu shot  apr 2019 - lap chole for acute cholecystitis  apr 2019 - ED visit w/ transplant hydro and urinary retention, dc'd w/ foley in place to f/u w/ urology  may 2019 - aki on ckd, abd pain, no hydro on [place] foley in from ED visit April;  foley dc'd and pt voided well w/o sig residual, creat 1.8 at dc (baseline 1.5 - 1.8).         ROS  denies CP  no joint pain   no HA  no blurry vision  no rash  no diarrhea  no nausea/ vomiting  no dysuria  no difficulty voiding  no change in urine color    Past Medical History  Past Medical History:  Diagnosis Date  . Chronic kidney disease   . Diabetes mellitus    Pt reports diagnosis in June 2011  . Hyperlipidemia   . Hypertension   . Kidney transplant recipient   . LEARNING DISABILITY 09/25/2007   Qualifier: Diagnosis of  By: ADeborra MedinaMD, TTanja Port   . 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 Pyelonephritis 06/23/2014  . UTI (urinary tract infection) 01/09/2015  . 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   Qualifier: Diagnosis of  By: ACarlena Sax MD, SColletta [place]    Past Surgical History  Past Surgical History:  Procedure Laterality Date  . CHOLECYSTECTOMY N/A 06/30/2017   Procedure: LAPAROSCOPIC CHOLECYSTECTOMY WITH INTRAOPERATIVE CHOLANGIOGRAM;  Surgeon: Excell Seltzer, MD;  Location: [facility];  Service: General;  Laterality: N/A;  . ESOPHAGOGASTRODUODENOSCOPY (EGD) WITH PROPOFOL N/A 07/04/2017   Procedure: ESOPHAGOGASTRODUODENOSCOPY (EGD) WITH PROPOFOL;  Surgeon: Clarene Essex, MD;  Location: [facility] ENDOSCOPY;  Service: Endoscopy;  Laterality: N/A;  . KIDNEY TRANSPLANT  2007  . RENAL BIOPSY Bilateral 2003  . THYROID LOBECTOMY     Family History  Family History  Problem Relation Age of Onset  . Arthritis Mother   . Diabetes Mother   . Hypertension Mother    Social History  reports that she has never smoked. She has never used smokeless tobacco. She reports that she does not drink alcohol or use drugs. Allergies  Allergies  Allergen Reactions  . Benadryl [Diphenhydramine-Zinc Acetate] Shortness Of Breath  . Motrin [Ibuprofen] Shortness Of Breath and Itching    Per pt  . Banana Other (See Comments)    Sick on the stomach  . Diphenhydramine Hcl     REACTION: Stopped breathing in ICU  . Doxycycline      Shortness of Breath   . Iron Dextran     REACTION: vein irritation  . Shellfish Allergy Hives   Home medications Prior to Admission medications   Medication Sig Start Date End Date Taking? Authorizing Provider  ACCU-CHEK SOFTCLIX LANCETS lancets Use to check blood sugar 4 times per day. 12/29/15  Yes Renato Shin, MD  acetaminophen (TYLENOL) 500 MG tablet Take 1,000 mg by mouth daily as needed for moderate pain.    Yes [provider]  cetirizine (ZYRTEC) 10 MG tablet Take 10 mg by mouth daily as needed for allergies.    Yes [provider]  docusate sodium (COLACE) 100 MG capsule Take 1 capsule (100 mg total) by mouth 2 (two) times daily. Patient taking differently: Take 100 mg by mouth 2 (two) times daily as needed for mild constipation.  07/15/17  Yes Debbe Odea, MD  famotidine (PEPCID) 40 MG tablet Take 1 tablet (40 mg total) by mouth daily. Can take up to 2 x day (every 12 hrs) 07/15/17  Yes Rizwan, Eunice Blase, MD  fluticasone (FLONASE) 50 MCG/ACT nasal spray Place 2 sprays into both nostrils daily. Patient taking differently: Place 2 sprays into both nostrils daily as needed for allergies.  07/20/15  Yes Muthersbaugh, Jarrett Soho, PA-C  furosemide (LASIX) 20 MG tablet Take 20 mg by mouth daily as needed. Feet swelling   Yes [provider]  gabapentin (NEURONTIN) 100 MG capsule Take 200 mg by mouth daily as needed (for feet).  11/12/16  Yes [provider]  hydrOXYzine (ATARAX/VISTARIL) 50 MG tablet Take 50 mg by mouth 2 (two) times daily as needed for itching.    Yes [provider]  insulin aspart (NOVOLOG FLEXPEN) 100 UNIT/ML FlexPen Inject 15 Units into the skin 3 (three) times daily with meals. 81-150=10 units 51-200=11 units 201-250=12 units 251-100=13 units 301-350=14 units >351=15 u Per sliding scale   Yes [provider]  insulin degludec (TRESIBA FLEXTOUCH) 100 UNIT/ML SOPN FlexTouch Pen Inject 1.3 mLs (130 Units total) into the skin  daily. And pen needles 1/day Patient taking differently: Inject 80 Units into the skin 2 (two) times daily at 8 am and 10 pm. And pen needles 1/day 07/22/16  Yes Renato Shin, MD  loperamide (IMODIUM) 2 MG capsule Take 2 mg by mouth as needed for diarrhea or loose stools. Take 2 tablets as needed for diarrhea.   Yes [provider]  mycophenolate (MYFORTIC) 360 MG TBEC Take 720  mg by mouth 2 (two) times daily.    Yes [provider]  ondansetron (ZOFRAN ODT) 4 MG disintegrating tablet 31m ODT q4 hours prn nausea/vomit Patient taking differently: Take 4 mg by mouth every 4 (four) hours as needed for nausea or vomiting.  07/06/17  Yes FDeno Etienne DO  predniSONE (DELTASONE) 5 MG tablet Take 1 tablet (5 mg total) by mouth at bedtime. Patient taking differently: Take 10 mg by mouth 2 (two) times daily.  07/15/17  Yes RDebbe Odea MD  simvastatin (ZOCOR) 20 MG tablet Take 20 mg by mouth 3 (three) times a week. MWF.   Yes [provider]  tacrolimus (PROGRAF) 1 MG capsule Take 3 mg by mouth 2 (two) times daily.    Yes [provider]   Liver Function Tests No results for input(s): AST, ALT, ALKPHOS, BILITOT, PROT, ALBUMIN in the last 168 hours. No results for input(s): LIPASE, AMYLASE in the last 168 hours. CBC Recent Labs  Lab 10/13/17 1112 10/17/17 2209 10/18/17 0521  WBC  --  11.5* 15.3*  HGB 11.9* 11.6* 11.5*  HCT  --  36.4 36.6  MCV  --  92.2 92.4  PLT  --  302 3347  Basic Metabolic Panel Recent Labs  Lab 10/17/17 2209 10/18/17 0521  NA 135 144  K 5.8* 4.5  CL 113* 121*  CO2 15* 16*  GLUCOSE 491* 92  BUN 77* 62*  CREATININE 2.77* 2.26*  CALCIUM 7.3* 7.1*   Iron/TIBC/Ferritin/ %Sat    Component Value Date/Time   IRON 77 10/13/2017 1108   TIBC 231 (L) 10/13/2017 1108   FERRITIN 711 (H) 10/13/2017 1108   IRONPCTSAT 33 (H) 10/13/2017 1108    Vitals:   10/18/17 0430 10/18/17 0500 10/18/17 0800 10/18/17 0830  BP: 122/88  (!) 163/78 122/90   Pulse: (!) 106 (!) 108 (!) 102 99  Resp: (!) 25 16 (!) 23 20  Temp:      TempSrc:      SpO2: 95% 99% 100% 99%  Weight:      Height:       Exam Gen alert, no distress, calm No rash, cyanosis or gangrene Sclera anicteric, throat clear  No jvd or bruits Chest clear bilat RRR no MRG Abd soft ntnd no mass or ascites +bs , no tenderness RLQ over the tx GU defer MS no joint effusions or deformity Ext no LE edema, no wounds or ulcers Neuro is alert, Ox 3 , nf    Home meds:  - myfortic 720 bid/ prednisone 10 bid / prograf 3 mg bid  - simvastatin 20 mwf/ neurontin 200 prn/ famotidine 40 qd  - degludec insulin 80u qhs/ insulin aspart ssi tid ac  - prn's     Impression: 1  Acute/ chronic renal failure of transplant - had biopsy last week, creat worsening over last 6 mos.  Creat down to 2.2 overnight w/ 4L IVF's.  Not vol overloaded today. K+ and BS much better now.   Patient can be dc'd home.  [place] 10 [place] and prograf 3 bid, prn clonidine for SBP > 150 and prn lasix for leg swelling 20 qd.  Her transplant has multiple chronic untreatable issues.  Her long-acting insulin should be 80u once daily not twice daily, I changed this in her PTA med list.   2  Hyperkalemia - prob due to uncont DM 3  Met acidosis - this can be managed  by [facility] team, not symptomatic 4  HTN - stable now, has been off lisinopril / HCT for > 1 month, no meds now except prn clonidiine   Plan - will sign off  Kelly Splinter MD [place] pager [phone]   10/18/2017, 9:06 AM

## 2017-10-19 LAB — MYCOPHENOLIC ACID (CELLCEPT)
MPA Glucuronide: 42 ug/mL (ref 15–125)
MPA: 0.1 ug/mL — ABNORMAL LOW (ref 1.0–3.5)

## 2017-10-20 ENCOUNTER — Encounter (HOSPITAL_COMMUNITY): Payer: Self-pay

## 2017-10-20 ENCOUNTER — Emergency Department (HOSPITAL_COMMUNITY)
Admission: EM | Admit: 2017-10-20 | Discharge: 2017-10-20 | Disposition: A | Discharge status: Home / Self Care | Payer: Medicare Other | Attending: Emergency Medicine | Admitting: Emergency Medicine

## 2017-10-20 DIAGNOSIS — G8929 Other chronic pain: Secondary | ICD-10-CM | POA: Diagnosis not present

## 2017-10-20 DIAGNOSIS — E1022 Type 1 diabetes mellitus with diabetic chronic kidney disease: Secondary | ICD-10-CM | POA: Diagnosis not present

## 2017-10-20 DIAGNOSIS — R52 Pain, unspecified: Secondary | ICD-10-CM | POA: Diagnosis not present

## 2017-10-20 DIAGNOSIS — R1084 Generalized abdominal pain: Secondary | ICD-10-CM | POA: Insufficient documentation

## 2017-10-20 DIAGNOSIS — Z79899 Other long term (current) drug therapy: Secondary | ICD-10-CM | POA: Diagnosis not present

## 2017-10-20 DIAGNOSIS — I129 Hypertensive chronic kidney disease with stage 1 through stage 4 chronic kidney disease, or unspecified chronic kidney disease: Secondary | ICD-10-CM | POA: Insufficient documentation

## 2017-10-20 DIAGNOSIS — K2900 Acute gastritis without bleeding: Secondary | ICD-10-CM | POA: Insufficient documentation

## 2017-10-20 DIAGNOSIS — N189 Chronic kidney disease, unspecified: Secondary | ICD-10-CM | POA: Insufficient documentation

## 2017-10-20 DIAGNOSIS — I10 Essential (primary) hypertension: Secondary | ICD-10-CM | POA: Diagnosis not present

## 2017-10-20 DIAGNOSIS — Z940 Kidney transplant status: Secondary | ICD-10-CM | POA: Diagnosis not present

## 2017-10-20 DIAGNOSIS — R112 Nausea with vomiting, unspecified: Secondary | ICD-10-CM | POA: Diagnosis not present

## 2017-10-20 DIAGNOSIS — R0902 Hypoxemia: Secondary | ICD-10-CM | POA: Diagnosis not present

## 2017-10-20 DIAGNOSIS — E1010 Type 1 diabetes mellitus with ketoacidosis without coma: Secondary | ICD-10-CM

## 2017-10-20 LAB — BLOOD GAS, VENOUS
Acid-base deficit: 7.5 mmol/L — ABNORMAL HIGH (ref 0.0–2.0)
Bicarbonate: 18.9 mmol/L — ABNORMAL LOW (ref 20.0–28.0)
O2 Saturation: 68.6 %
Patient temperature: 98.6
pCO2, Ven: 43.8 mmHg — ABNORMAL LOW (ref 44.0–60.0)
pH, Ven: 7.257 (ref 7.250–7.430)
pO2, Ven: 42.5 mmHg (ref 32.0–45.0)

## 2017-10-20 LAB — COMPREHENSIVE METABOLIC PANEL
ALT: 14 U/L (ref 0–44)
AST: 13 U/L — ABNORMAL LOW (ref 15–41)
Albumin: 4 g/dL (ref 3.5–5.0)
Alkaline Phosphatase: 64 U/L (ref 38–126)
Anion gap: 11 (ref 5–15)
BUN: 38 mg/dL — ABNORMAL HIGH (ref 6–20)
CO2: 18 mmol/L — ABNORMAL LOW (ref 22–32)
Calcium: 7.3 mg/dL — ABNORMAL LOW (ref 8.9–10.3)
Chloride: 115 mmol/L — ABNORMAL HIGH (ref 98–111)
Creatinine, Ser: 1.79 mg/dL — ABNORMAL HIGH (ref 0.44–1.00)
GFR calc Af Amer: 42 mL/min — ABNORMAL LOW (ref >60)
GFR calc non Af Amer: 36 mL/min — ABNORMAL LOW (ref >60)
Glucose, Bld: 192 mg/dL — ABNORMAL HIGH (ref 70–99)
Potassium: 3.9 mmol/L (ref 3.5–5.1)
Sodium: 144 mmol/L (ref 135–145)
Total Bilirubin: 0.7 mg/dL (ref 0.3–1.2)
Total Protein: 7.3 g/dL (ref 6.5–8.1)

## 2017-10-20 LAB — URINALYSIS, ROUTINE W REFLEX MICROSCOPIC
Bilirubin Urine: NEGATIVE
Glucose, UA: NEGATIVE mg/dL
Hgb urine dipstick: NEGATIVE
Ketones, ur: NEGATIVE mg/dL
Leukocytes, UA: NEGATIVE
Nitrite: NEGATIVE
Protein, ur: 100 mg/dL — AB
Specific Gravity, Urine: 1.012 (ref 1.005–1.030)
pH: 5 (ref 5.0–8.0)

## 2017-10-20 LAB — CBC WITH DIFFERENTIAL/PLATELET
Basophils Absolute: 0 10*3/uL (ref 0.0–0.1)
Basophils Relative: 0 %
Eosinophils Absolute: 0.1 10*3/uL (ref 0.0–0.7)
Eosinophils Relative: 1 %
HCT: 39.3 % (ref 36.0–46.0)
Hemoglobin: 12.7 g/dL (ref 12.0–15.0)
Lymphocytes Relative: 10 %
Lymphs Abs: 1.4 10*3/uL (ref 0.7–4.0)
MCH: 29.1 pg (ref 26.0–34.0)
MCHC: 32.3 g/dL (ref 30.0–36.0)
MCV: 89.9 fL (ref 78.0–100.0)
Monocytes Absolute: 0.8 10*3/uL (ref 0.1–1.0)
Monocytes Relative: 6 %
Neutro Abs: 12.2 10*3/uL — ABNORMAL HIGH (ref 1.7–7.7)
Neutrophils Relative %: 83 %
Platelets: 291 10*3/uL (ref 150–400)
RBC: 4.37 MIL/uL (ref 3.87–5.11)
RDW: 15.8 % — ABNORMAL HIGH (ref 11.5–15.5)
WBC: 14.5 10*3/uL — ABNORMAL HIGH (ref 4.0–10.5)

## 2017-10-20 LAB — TACROLIMUS LEVEL: TACROLIMUS (FK506) - LABCORP: 1.9 ng/mL — AB (ref 2.0–20.0)

## 2017-10-20 MED ORDER — SODIUM CHLORIDE 0.9 % IV BOLUS
1000.0000 mL | Freq: Once | INTRAVENOUS | Status: AC
  Administered 2017-10-20: 1000 mL via INTRAVENOUS

## 2017-10-20 MED ORDER — PROMETHAZINE HCL 25 MG PO TABS: 25.0000 mg | ORAL_TABLET | Freq: Four times a day (QID) | ORAL | 0 refills | Status: DC | PRN

## 2017-10-20 MED ORDER — TRAMADOL HCL 50 MG PO TABS: 50.0000 mg | ORAL_TABLET | Freq: Four times a day (QID) | ORAL | 0 refills | Status: DC | PRN

## 2017-10-20 MED ORDER — MORPHINE SULFATE (PF) 4 MG/ML IV SOLN
6.0000 mg | Freq: Once | INTRAVENOUS | Status: AC
  Administered 2017-10-20: 6 mg via INTRAVENOUS
  Filled 2017-10-20: qty 2

## 2017-10-20 MED ORDER — PROMETHAZINE HCL 25 MG/ML IJ SOLN
25.0000 mg | Freq: Once | INTRAMUSCULAR | Status: AC
  Administered 2017-10-20: 25 mg via INTRAVENOUS
  Filled 2017-10-20: qty 1

## 2017-10-20 NOTE — ED Notes (Signed)
Pt is alert and oriented x 4 and is verbally responsive. Pt reports 9/10 abdominal pain Pt has a friend at bedside.

## 2017-10-20 NOTE — ED Notes (Signed)
Bed: WA21 Expected date:  Expected time:  Means of arrival:  Comments: EMS 32 yo female from home abdominal pain nausea and vomiting x 2 days

## 2017-10-20 NOTE — ED Notes (Signed)
URINE REQUEST MADE

## 2017-10-20 NOTE — Discharge Instructions (Signed)
Return here as needed.  Follow-up with your doctor as soon as possible.  Increase your fluid intake and rest as much as possible.

## 2017-10-20 NOTE — ED Triage Notes (Signed)
Pt arrived from home with complaints of abdominal pain with nausea and vomiting. Pt states it started today but mentioned was seen two days ago for the same issue. 4mg  zofran given en route

## 2017-10-20 NOTE — Care Management Note (Addendum)
Case Management Note  CM noted pt's return to the ED.  Notified pt that she did not qualify for [facility] follow up.  Placed stat consult for DM coordinator and dietician to see pt in the ED prior to transition home.  Outpt DM education consulted for follow up as well. Updated Laywer, PA.  No further CM needs noted at this time.  Deral Schellenberg, Benjaman Lobe, RN 10/20/2017, 10:57 AM

## 2017-10-20 NOTE — ED Provider Notes (Signed)
[facility] DEPT Provider Note   CSN: [id number] Arrival date & time: 10/20/17  0500     History   Chief Complaint Chief Complaint  Patient presents with  . Abdominal Pain  . Emesis    HPI [name] is a 32 y.o. female.  HPI Patient presents to the emergency department with chronic abdominal pain with nausea and vomiting that started 2 days ago.  The patient states she was recently admitted to the hospital for similar symptoms except at that time she was in DKA.  Patient states that nothing seemed to make the condition better or worse.  The patient denies chest pain, shortness of breath, headache,blurred vision, neck pain, fever, cough, weakness, numbness, dizziness, anorexia, edema,diarrhea, rash, back pain, dysuria, hematemesis, bloody stool, near syncope, or syncope. Past Medical History:  Diagnosis Date  . Chronic kidney disease   . Diabetes mellitus    Pt reports diagnosis in June 2011  . Hyperlipidemia   . Hypertension   . Kidney transplant recipient   . 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 Pyelonephritis 06/23/2014  . UTI (urinary tract infection) 01/09/2015  . 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 DKA (diabetic ketoacidoses) ([place]) 10/18/2017  . Hyperkalemia 10/18/2017  . AKI (acute kidney injury) ([place]) 07/13/2017  . Acute lower UTI 07/13/2017  . Constipation 07/13/2017  . Hematemesis 07/02/2017  . Dehydration 07/02/2017  . Chronic cholecystitis 06/29/2017  . Type 1 diabetes mellitus with complication, uncontrolled ([place]) 05/25/2015  . Intractable nausea and vomiting 01/09/2015  . Renal transplant recipient   . Immunosuppressed status ([place])   . ARF (acute renal failure) ([place]) 09/30/2014  . 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  CHRONIC KIDNEY DISEASE STAGE II (MILD) 01/04/2010  . OVARIAN FAILURE, PREMATURE 03/11/2009  . 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 SECONDARY HYPERPARATHYROIDISM 12/05/2007  . OBESITY 09/25/2007  . Anemia in chronic renal disease 09/25/2007  . LEARNING DISABILITY 09/25/2007  . Essential hypertension, benign 09/25/2007  . KIDNEY TRANSPLANTATION, HX OF 09/25/2007    Past Surgical History:  Procedure Laterality Date  . CHOLECYSTECTOMY N/A 06/30/2017   Procedure: LAPAROSCOPIC CHOLECYSTECTOMY WITH INTRAOPERATIVE CHOLANGIOGRAM;  Surgeon: Excell Seltzer, MD;  Location: [facility];  Service: General;  Laterality: N/A;  . ESOPHAGOGASTRODUODENOSCOPY (EGD) WITH PROPOFOL N/A 07/04/2017   Procedure: ESOPHAGOGASTRODUODENOSCOPY (EGD) WITH PROPOFOL;  Surgeon: Clarene Essex, MD;  Location[facility];  Service: Endoscopy;  Laterality: N/A;  . KIDNEY TRANSPLANT  2007  . RENAL BIOPSY Bilateral 2003  . THYROID LOBECTOMY       OB History   None      Home Medications    Prior to Admission medications   Medication Sig Start Date End Date Taking? Authorizing Provider  acetaminophen (TYLENOL) 500 MG tablet Take 1,000 mg by mouth daily as needed for moderate pain.    Yes [provider]  calcium elemental as carbonate (BARIATRIC TUMS ULTRA) 400 MG chewable tablet Chew 1,000 mg by mouth 3 (three) times daily as needed for heartburn.    Yes [provider]  cetirizine (ZYRTEC) 10 MG tablet Take 10 mg by mouth daily as needed for allergies.    Yes [provider]  docusate sodium (COLACE) 100 MG capsule Take 1 capsule (100 mg total) by mouth 2 (  two) times daily. Patient taking differently: Take 100 mg by mouth 2 (two) times daily as needed for mild constipation.  07/15/17  Yes Debbe Odea, MD  famotidine (PEPCID) 40 MG tablet Take 1 tablet (40 mg total) by mouth daily. Can take up to 2 x day (every 12 hrs) 07/15/17  Yes Rizwan, Eunice Blase, MD  fluticasone (FLONASE) 50 MCG/ACT nasal spray Place 2  sprays into both nostrils daily. Patient taking differently: Place 2 sprays into both nostrils daily as needed for allergies.  07/20/15  Yes Muthersbaugh, Jarrett Soho, PA-C  furosemide (LASIX) 20 MG tablet Take 20 mg by mouth daily as needed. Feet swelling   Yes [provider]  gabapentin (NEURONTIN) 100 MG capsule Take 200 mg by mouth daily as needed (for feet).  11/12/16  Yes [provider]  hydrOXYzine (ATARAX/VISTARIL) 50 MG tablet Take 50 mg by mouth 2 (two) times daily as needed for itching.    Yes [provider]  insulin aspart (NOVOLOG FLEXPEN) 100 UNIT/ML FlexPen Inject 15 Units into the skin 3 (three) times daily with meals. 81-150=10 units 51-200=11 units 201-250=12 units 251-300=13 units 301-350=14 units >351=15 u Per sliding scale   Yes [provider]  insulin degludec (TRESIBA) 100 UNIT/ML SOPN FlexTouch Pen Inject 80 Units into the skin at bedtime.   Yes [provider]  loperamide (IMODIUM) 2 MG capsule Take 2 mg by mouth as needed for diarrhea or loose stools. Take 2 tablets as needed for diarrhea.   Yes [provider]  mycophenolate (MYFORTIC) 360 MG TBEC Take 720 mg by mouth 2 (two) times daily.    Yes [provider]  ondansetron (ZOFRAN ODT) 4 MG disintegrating tablet 4mg  ODT q4 hours prn nausea/vomit Patient taking differently: Take 4 mg by mouth every 4 (four) hours as needed for nausea or vomiting.  07/06/17  Yes Deno Etienne, DO  predniSONE (DELTASONE) 5 MG tablet Take 1 tablet (5 mg total) by mouth at bedtime. Patient taking differently: Take 10 mg by mouth 2 (two) times daily.  07/15/17  Yes Debbe Odea, MD  simvastatin (ZOCOR) 20 MG tablet Take 20 mg by mouth 3 (three) times a week. MWF.   Yes [provider]  tacrolimus (PROGRAF) 1 MG capsule Take 3 mg by mouth 2 (two) times daily.    Yes [provider]  ACCU-CHEK SOFTCLIX LANCETS lancets Use to check blood sugar 4 times per day. 12/29/15    Renato Shin, MD    Family History Family History  Problem Relation Age of Onset  . Arthritis Mother   . Diabetes Mother   . Hypertension Mother     Social History Social History   Tobacco Use  . Smoking status: Never Smoker  . Smokeless tobacco: Never Used  Substance Use Topics  . Alcohol use: No  . Drug use: No     Allergies   Benadryl [diphenhydramine-zinc acetate]; Motrin [ibuprofen]; Banana; Diphenhydramine hcl; Doxycycline; Iron dextran; and Shellfish allergy   Review of Systems Review of Systems All other systems negative except as documented in the HPI. All pertinent positives and negatives as reviewed in the HPI.  Physical Exam Updated Vital Signs BP (!) 130/101 (BP Location: Right Arm)   Pulse 99   Temp 98.1 F (36.7 C) (Oral)   Resp 16   Ht 5\' 6"  (1.676 m)   Wt 78 kg   SpO2 100%   BMI 27.75 kg/m   Physical Exam  Constitutional: She is oriented to person, place, and time.  She appears well-developed and well-nourished. No distress.  HENT:  Head: Normocephalic and atraumatic.  Mouth/Throat: Oropharynx is clear and moist.  Eyes: Pupils are equal, round, and reactive to light.  Neck: Normal range of motion. Neck supple.  Cardiovascular: Normal rate, regular rhythm and normal heart sounds. Exam reveals no gallop and no friction rub.  No murmur heard. Pulmonary/Chest: Effort normal and breath sounds normal. No respiratory distress. She has no wheezes.  Abdominal: Soft. Bowel sounds are normal. She exhibits no distension. There is generalized tenderness. There is no rigidity and no guarding.  Neurological: She is alert and oriented to person, place, and time. She exhibits normal muscle tone. Coordination normal.  Skin: Skin is warm and dry. Capillary refill takes less than 2 seconds. No rash noted. No erythema.  Psychiatric: She has a normal mood and affect. Her behavior is normal.  Nursing note and vitals reviewed.    ED Treatments / Results   Labs (all labs ordered are listed, but only abnormal results are displayed) Labs Reviewed  COMPREHENSIVE METABOLIC PANEL - Abnormal; Notable for the following components:      Result Value   Chloride 115 (*)    CO2 18 (*)    Glucose, Bld 192 (*)    BUN 38 (*)    Creatinine, Ser 1.79 (*)    Calcium 7.3 (*)    AST 13 (*)    GFR calc non Af Amer 36 (*)    GFR calc Af Amer 42 (*)    All other components within normal limits  CBC WITH DIFFERENTIAL/PLATELET - Abnormal; Notable for the following components:   WBC 14.5 (*)    RDW 15.8 (*)    Neutro Abs 12.2 (*)    All other components within normal limits  URINALYSIS, ROUTINE W REFLEX MICROSCOPIC - Abnormal; Notable for the following components:   Color, Urine STRAW (*)    Protein, ur 100 (*)    Bacteria, UA RARE (*)    All other components within normal limits  BLOOD GAS, VENOUS - Abnormal; Notable for the following components:   pCO2, Ven 43.8 (*)    Bicarbonate 18.9 (*)    Acid-base deficit 7.5 (*)    All other components within normal limits    EKG None  Radiology No results found.  Procedures Procedures (including critical care time)  Medications Ordered in ED Medications  sodium chloride 0.9 % bolus 1,000 mL (0 mLs Intravenous Stopped 10/20/17 0937)  morphine 4 MG/ML injection 6 mg (6 mg Intravenous Given 10/20/17 0657)  promethazine (PHENERGAN) injection 25 mg (25 mg Intravenous Given 10/20/17 0654)  sodium chloride 0.9 % bolus 1,000 mL (0 mLs Intravenous Stopped 10/20/17 1206)     Initial Impression / Assessment and Plan / ED Course  I have reviewed the triage vital signs and the nursing notes.  Pertinent labs & imaging results that were available during my care of the patient were reviewed by me and considered in my medical decision making (see chart for details).     Patient is given several liters of IV fluid along with pain control and antiemetics.  Case management did speak with the patient and was attempting  to get close follow-up for her diabetes.  The patient has improved since being here in the emergency department.  Patient will be advised to follow-up with her primary care doctor told return here as needed.  Final Clinical Impressions(s) / ED Diagnoses   Final diagnoses:  Diabetic ketoacidosis without coma associated with  type 1 diabetes mellitus [facility])    ED Discharge Orders         Ordered    Ambulatory referral to Nutrition and Diabetic Education    Comments:  Frequent admissions for diabetes; HgbA1C in April, 2019 was 8.1%.   10/20/17 1140           Dalia Heading, PA-C 10/20/17 1431    Davonna Belling, MD 10/20/17 [phone]

## 2017-10-21 ENCOUNTER — Emergency Department (HOSPITAL_COMMUNITY): Payer: Medicare Other

## 2017-10-21 ENCOUNTER — Emergency Department (HOSPITAL_COMMUNITY)
Admission: EM | Admit: 2017-10-21 | Discharge: 2017-10-21 | Disposition: A | Discharge status: Home / Self Care | Payer: Medicare Other | Attending: Emergency Medicine | Admitting: Emergency Medicine

## 2017-10-21 ENCOUNTER — Encounter (HOSPITAL_COMMUNITY): Payer: Self-pay | Admitting: Emergency Medicine

## 2017-10-21 ENCOUNTER — Other Ambulatory Visit: Payer: Self-pay

## 2017-10-21 DIAGNOSIS — R109 Unspecified abdominal pain: Secondary | ICD-10-CM | POA: Diagnosis not present

## 2017-10-21 DIAGNOSIS — R221 Localized swelling, mass and lump, neck: Secondary | ICD-10-CM | POA: Diagnosis not present

## 2017-10-21 DIAGNOSIS — M79675 Pain in left toe(s): Secondary | ICD-10-CM | POA: Diagnosis not present

## 2017-10-21 DIAGNOSIS — E1022 Type 1 diabetes mellitus with diabetic chronic kidney disease: Secondary | ICD-10-CM | POA: Insufficient documentation

## 2017-10-21 DIAGNOSIS — I129 Hypertensive chronic kidney disease with stage 1 through stage 4 chronic kidney disease, or unspecified chronic kidney disease: Secondary | ICD-10-CM | POA: Insufficient documentation

## 2017-10-21 DIAGNOSIS — Z794 Long term (current) use of insulin: Secondary | ICD-10-CM | POA: Diagnosis not present

## 2017-10-21 DIAGNOSIS — R1030 Lower abdominal pain, unspecified: Secondary | ICD-10-CM | POA: Insufficient documentation

## 2017-10-21 DIAGNOSIS — G8929 Other chronic pain: Secondary | ICD-10-CM | POA: Diagnosis not present

## 2017-10-21 DIAGNOSIS — N182 Chronic kidney disease, stage 2 (mild): Secondary | ICD-10-CM | POA: Diagnosis not present

## 2017-10-21 DIAGNOSIS — R112 Nausea with vomiting, unspecified: Secondary | ICD-10-CM | POA: Diagnosis not present

## 2017-10-21 DIAGNOSIS — S61211A Laceration without foreign body of left index finger without damage to nail, initial encounter: Secondary | ICD-10-CM | POA: Diagnosis not present

## 2017-10-21 DIAGNOSIS — E1010 Type 1 diabetes mellitus with ketoacidosis without coma: Secondary | ICD-10-CM | POA: Insufficient documentation

## 2017-10-21 DIAGNOSIS — S0011XA Contusion of right eyelid and periocular area, initial encounter: Secondary | ICD-10-CM | POA: Diagnosis not present

## 2017-10-21 DIAGNOSIS — R000 Tachycardia, unspecified: Secondary | ICD-10-CM | POA: Diagnosis not present

## 2017-10-21 DIAGNOSIS — M546 Pain in thoracic spine: Secondary | ICD-10-CM | POA: Diagnosis not present

## 2017-10-21 DIAGNOSIS — Z79899 Other long term (current) drug therapy: Secondary | ICD-10-CM | POA: Diagnosis not present

## 2017-10-21 DIAGNOSIS — R1111 Vomiting without nausea: Secondary | ICD-10-CM | POA: Diagnosis not present

## 2017-10-21 LAB — COMPREHENSIVE METABOLIC PANEL
ALT: 12 U/L (ref 0–44)
AST: 16 U/L (ref 15–41)
Albumin: 3.4 g/dL — ABNORMAL LOW (ref 3.5–5.0)
Alkaline Phosphatase: 61 U/L (ref 38–126)
Anion gap: 11 (ref 5–15)
BUN: 26 mg/dL — ABNORMAL HIGH (ref 6–20)
CO2: 20 mmol/L — ABNORMAL LOW (ref 22–32)
Calcium: 6.9 mg/dL — ABNORMAL LOW (ref 8.9–10.3)
Chloride: 112 mmol/L — ABNORMAL HIGH (ref 98–111)
Creatinine, Ser: 1.85 mg/dL — ABNORMAL HIGH (ref 0.44–1.00)
GFR calc Af Amer: 40 mL/min — ABNORMAL LOW (ref >60)
GFR calc non Af Amer: 35 mL/min — ABNORMAL LOW (ref >60)
Glucose, Bld: 119 mg/dL — ABNORMAL HIGH (ref 70–99)
Potassium: 3.8 mmol/L (ref 3.5–5.1)
Sodium: 143 mmol/L (ref 135–145)
Total Bilirubin: 1 mg/dL (ref 0.3–1.2)
Total Protein: 6.7 g/dL (ref 6.5–8.1)

## 2017-10-21 LAB — URINALYSIS, ROUTINE W REFLEX MICROSCOPIC
Bilirubin Urine: NEGATIVE
Glucose, UA: NEGATIVE mg/dL
Hgb urine dipstick: NEGATIVE
Ketones, ur: NEGATIVE mg/dL
Leukocytes, UA: NEGATIVE
Nitrite: NEGATIVE
Protein, ur: 100 mg/dL — AB
Specific Gravity, Urine: 1.012 (ref 1.005–1.030)
pH: 5 (ref 5.0–8.0)

## 2017-10-21 LAB — CBC
HCT: 37.4 % (ref 36.0–46.0)
Hemoglobin: 11.7 g/dL — ABNORMAL LOW (ref 12.0–15.0)
MCH: 28.8 pg (ref 26.0–34.0)
MCHC: 31.3 g/dL (ref 30.0–36.0)
MCV: 92.1 fL (ref 78.0–100.0)
Platelets: 243 10*3/uL (ref 150–400)
RBC: 4.06 MIL/uL (ref 3.87–5.11)
RDW: 15.5 % (ref 11.5–15.5)
WBC: 15.3 10*3/uL — ABNORMAL HIGH (ref 4.0–10.5)

## 2017-10-21 LAB — I-STAT BETA HCG BLOOD, ED (MC, WL, AP ONLY): I-stat hCG, quantitative: <5 m[IU]/mL (ref <5)

## 2017-10-21 LAB — I-STAT ARTERIAL BLOOD GAS, ED
Acid-base deficit: 6 mmol/L — ABNORMAL HIGH (ref 0.0–2.0)
Bicarbonate: 19.4 mmol/L — ABNORMAL LOW (ref 20.0–28.0)
O2 Saturation: 97 %
Patient temperature: 98.6
TCO2: 21 mmol/L — ABNORMAL LOW (ref 22–32)
pCO2 arterial: 37.2 mmHg (ref 32.0–48.0)
pH, Arterial: 7.326 — ABNORMAL LOW (ref 7.350–7.450)
pO2, Arterial: 93 mmHg (ref 83.0–108.0)

## 2017-10-21 LAB — BETA-HYDROXYBUTYRIC ACID: Beta-Hydroxybutyric Acid: 0.54 mmol/L — ABNORMAL HIGH (ref 0.05–0.27)

## 2017-10-21 LAB — LIPASE, BLOOD: Lipase: 37 U/L (ref 11–51)

## 2017-10-21 MED ORDER — FAMOTIDINE IN NACL 20-0.9 MG/50ML-% IV SOLN
20.0000 mg | Freq: Once | INTRAVENOUS | Status: AC
  Administered 2017-10-21: 20 mg via INTRAVENOUS
  Filled 2017-10-21: qty 50

## 2017-10-21 MED ORDER — PROMETHAZINE HCL 25 MG/ML IJ SOLN
12.5000 mg | Freq: Once | INTRAMUSCULAR | Status: AC
  Administered 2017-10-21: 12.5 mg via INTRAVENOUS
  Filled 2017-10-21: qty 1

## 2017-10-21 MED ORDER — HYDROCODONE-ACETAMINOPHEN 5-325 MG PO TABS: 1.0000 | ORAL_TABLET | Freq: Four times a day (QID) | ORAL | 0 refills | Status: DC | PRN

## 2017-10-21 MED ORDER — LACTATED RINGERS IV BOLUS
1000.0000 mL | Freq: Once | INTRAVENOUS | Status: AC
  Administered 2017-10-21: 1000 mL via INTRAVENOUS

## 2017-10-21 MED ORDER — MORPHINE SULFATE (PF) 4 MG/ML IV SOLN
6.0000 mg | Freq: Once | INTRAVENOUS | Status: AC
  Administered 2017-10-21: 6 mg via INTRAVENOUS
  Filled 2017-10-21: qty 2

## 2017-10-21 MED ORDER — HALOPERIDOL LACTATE 5 MG/ML IJ SOLN
2.0000 mg | Freq: Once | INTRAMUSCULAR | Status: AC
  Administered 2017-10-21: 2 mg via INTRAVENOUS
  Filled 2017-10-21: qty 1

## 2017-10-21 NOTE — ED Triage Notes (Signed)
Pt reports abdominal pain and emesis X1 day.  Pt states she has been unable to keep her medications down.  Pt reports she took Zofran at home however no relief.

## 2017-10-21 NOTE — ED Provider Notes (Signed)
[facility] EMERGENCY DEPARTMENT Provider Note   CSN: [id number] Arrival date & time: 10/21/17  4401     History   Chief Complaint Chief Complaint  Patient presents with  . Abdominal Pain  . Emesis    HPI [name] is a 32 y.o. female.  HPI    74yF with abdominal pain and n/v. Onset 1d ago. Was seen in the ED at [facility] yesterday for similar. Went home and began feeling worse again. Abdominal pain is worse across the lower abdomen.  Waxes and wanes but does not completely go away.  Not keeping meds down and she is concerned because she is s/p renal transplant. Reports glucose in 200s last night. No fever. No urinary complaints.   Past Medical History:  Diagnosis Date  . Chronic kidney disease   . Diabetes mellitus    Pt reports diagnosis in June 2011  . Hyperlipidemia   . Hypertension   . Kidney transplant recipient   . 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 Pyelonephritis 06/23/2014  . UTI (urinary tract infection) 01/09/2015  . 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 DKA (diabetic ketoacidoses) ([place]) 10/18/2017  . Hyperkalemia 10/18/2017  . AKI (acute kidney injury) ([place]) 07/13/2017  . Acute lower UTI 07/13/2017  . Constipation 07/13/2017  . Hematemesis 07/02/2017  . Dehydration 07/02/2017  . Chronic cholecystitis 06/29/2017  . Type 1 diabetes mellitus with complication, uncontrolled ([place]) 05/25/2015  . Intractable nausea and vomiting 01/09/2015  . Renal transplant recipient   . Immunosuppressed status ([place])   . ARF (acute renal failure) ([place]) 09/30/2014  . 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 CHRONIC KIDNEY DISEASE STAGE II (M[facility]) 01/04/2010  . OVARIAN FAILURE, PREMATURE 03/11/2009  . 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 SECONDARY HYPERPARATHYROIDISM  12/05/2007  . OBESITY 09/25/2007  . Anemia in chronic renal disease 09/25/2007  . LEARNING DISABILITY 09/25/2007  . Essential hypertension, benign 09/25/2007  . KIDNEY TRANSPLANTATION, HX OF 09/25/2007    Past Surgical History:  Procedure Laterality Date  . CHOLECYSTECTOMY N/A 06/30/2017   Procedure: LAPAROSCOPIC CHOLECYSTECTOMY WITH INTRAOPERATIVE CHOLANGIOGRAM;  Surgeon: Excell Seltzer, MD;  Location: [facility];  Service: General;  Laterality: N/A;  . ESOPHAGOGASTRODUODENOSCOPY (EGD) WITH PROPOFOL N/A 07/04/2017   Procedure: ESOPHAGOGASTRODUODENOSCOPY (EGD) WITH PROPOFOL;  Surgeon: Clarene Essex, MD;  Location: [facility] ENDOSCOPY;  Service: Endoscopy;  Laterality: N/A;  . KIDNEY TRANSPLANT  2007  . RENAL BIOPSY Bilateral 2003  . THYROID LOBECTOMY       OB History   None      Home Medications    Prior to Admission medications   Medication Sig Start Date End Date Taking? Authorizing Provider  ACCU-CHEK SOFTCLIX LANCETS lancets Use to check blood sugar 4 times per day. 12/29/15   Renato Shin, MD  acetaminophen (TYLENOL) 500 MG tablet Take 1,000 mg by mouth daily as needed for moderate pain.     [provider]  calcium elemental as carbonate (BARIATRIC TUMS ULTRA) 400 MG chewable tablet Chew 1,000 mg by mouth 3 (three) times daily as needed for heartburn.     [provider]  cetirizine (ZYRTEC) 10 MG tablet Take 10 mg by mouth daily as needed for allergies.     [provider]  docusate sodium (COLACE) 100  MG capsule Take 1 capsule (100 mg total) by mouth 2 (two) times daily. Patient taking differently: Take 100 mg by mouth 2 (two) times daily as needed for mild constipation.  07/15/17   Debbe Odea, MD  famotidine (PEPCID) 40 MG tablet Take 1 tablet (40 mg total) by mouth daily. Can take up to 2 x day (every 12 hrs) 07/15/17   Debbe Odea, MD  fluticasone (FLONASE) 50 MCG/ACT nasal spray Place 2 sprays into both nostrils daily. Patient taking differently:  Place 2 sprays into both nostrils daily as needed for allergies.  07/20/15   Muthersbaugh, Jarrett Soho, PA-C  furosemide (LASIX) 20 MG tablet Take 20 mg by mouth daily as needed. Feet swelling    [provider]  gabapentin (NEURONTIN) 100 MG capsule Take 200 mg by mouth daily as needed (for feet).  11/12/16   [provider]  hydrOXYzine (ATARAX/VISTARIL) 50 MG tablet Take 50 mg by mouth 2 (two) times daily as needed for itching.     [provider]  insulin aspart (NOVOLOG FLEXPEN) 100 UNIT/ML FlexPen Inject 15 Units into the skin 3 (three) times daily with meals. 81-150=10 units 51-200=11 units 201-250=12 units 251-300=13 units 301-350=14 units >351=15 u Per sliding scale    [provider]  insulin degludec (TRESIBA) 100 UNIT/ML SOPN FlexTouch Pen Inject 80 Units into the skin at bedtime.    [provider]  loperamide (IMODIUM) 2 MG capsule Take 2 mg by mouth as needed for diarrhea or loose stools. Take 2 tablets as needed for diarrhea.    [provider]  mycophenolate (MYFORTIC) 360 MG TBEC Take 720 mg by mouth 2 (two) times daily.     [provider]  ondansetron (ZOFRAN ODT) 4 MG disintegrating tablet 4mg  ODT q4 hours prn nausea/vomit Patient taking differently: Take 4 mg by mouth every 4 (four) hours as needed for nausea or vomiting.  07/06/17   Deno Etienne, DO  predniSONE (DELTASONE) 5 MG tablet Take 1 tablet (5 mg total) by mouth at bedtime. Patient taking differently: Take 10 mg by mouth 2 (two) times daily.  07/15/17   Debbe Odea, MD  promethazine (PHENERGAN) 25 MG tablet Take 1 tablet (25 mg total) by mouth every 6 (six) hours as needed for nausea or vomiting. 10/20/17   Lawyer, Harrell Gave, PA-C  simvastatin (ZOCOR) 20 MG tablet Take 20 mg by mouth 3 (three) times a week. MWF.    [provider]  tacrolimus (PROGRAF) 1 MG capsule Take 3 mg by mouth 2 (two) times daily.     [provider]  traMADol (ULTRAM) 50  MG tablet Take 1 tablet (50 mg total) by mouth every 6 (six) hours as needed for severe pain. 10/20/17   Dalia Heading, PA-C    Family History Family History  Problem Relation Age of Onset  . Arthritis Mother   . Diabetes Mother   . Hypertension Mother     Social History Social History   Tobacco Use  . Smoking status: Never Smoker  . Smokeless tobacco: Never Used  Substance Use Topics  . Alcohol use: No  . Drug use: No     Allergies   Benadryl [diphenhydramine-zinc acetate]; Motrin [ibuprofen]; Banana; Diphenhydramine hcl; Doxycycline; Iron dextran; and Shellfish allergy   Review of Systems Review of Systems  All systems reviewed and negative, other than as noted in HPI.  Physical Exam Updated Vital Signs BP (!) 137/97   Pulse (!) 128   Temp 98.8 F (37.1 C) (Oral)  Resp 20   SpO2 100%   Physical Exam  Constitutional: She appears well-developed and well-nourished. No distress.  HENT:  Head: Normocephalic and atraumatic.  Eyes: Conjunctivae are normal. Right eye exhibits no discharge. Left eye exhibits no discharge.  Neck: Neck supple.  Cardiovascular: Regular rhythm and normal heart sounds. Exam reveals no gallop and no friction rub.  No murmur heard. Tachycardic  Pulmonary/Chest: Effort normal and breath sounds normal. No respiratory distress.  Abdominal: Soft. She exhibits no distension. There is tenderness.  Multiple well-healed surgical scars.  Abdomen is soft.  Tenderness across the lower abdomen without discrete focality.  No rebound or guarding.  Musculoskeletal: She exhibits no edema or tenderness.  Neurological: She is alert.  Skin: Skin is warm and dry.  Psychiatric: She has a normal mood and affect. Her behavior is normal. Thought content normal.  Nursing note and vitals reviewed.    ED Treatments / Results  Labs (all labs ordered are listed, but only abnormal results are displayed) Labs Reviewed  COMPREHENSIVE METABOLIC PANEL -  Abnormal; Notable for the following components:      Result Value   Chloride 112 (*)    CO2 20 (*)    Glucose, Bld 119 (*)    BUN 26 (*)    Creatinine, Ser 1.85 (*)    Calcium 6.9 (*)    Albumin 3.4 (*)    GFR calc non Af Amer 35 (*)    GFR calc Af Amer 40 (*)    All other components within normal limits  CBC - Abnormal; Notable for the following components:   WBC 15.3 (*)    Hemoglobin 11.7 (*)    All other components within normal limits  URINALYSIS, ROUTINE W REFLEX MICROSCOPIC - Abnormal; Notable for the following components:   APPearance HAZY (*)    Protein, ur 100 (*)    Bacteria, UA RARE (*)    All other components within normal limits  BETA-HYDROXYBUTYRIC ACID - Abnormal; Notable for the following components:   Beta-Hydroxybutyric Acid 0.54 (*)    All other components within normal limits  I-STAT ARTERIAL BLOOD GAS, ED - Abnormal; Notable for the following components:   pH, Arterial 7.326 (*)    Bicarbonate 19.4 (*)    TCO2 21 (*)    Acid-base deficit 6.0 (*)    All other components within normal limits  LIPASE, BLOOD  I-STAT BETA HCG BLOOD, ED ([facility], [facility], [facility] ONLY)    EKG None  Radiology No results found.   No results found.  Procedures Procedures (including critical care time)  Medications Ordered in ED Medications - No data to display   Initial Impression / Assessment and Plan / ED Course  I have reviewed the triage vital signs and the nursing notes.  Pertinent labs & imaging results that were available during my care of the patient were reviewed by me and considered in my medical decision making (see chart for details).     32 year old female with abdominal pain and nausea/vomiting.  Recently seen for the same.  Work-up is pretty reassuring.  She is still mildly tachycardic but she also remains very anxious.  She is able to tolerate p.o. here in the emergency room.  I doubt acute emergent process.  Nausea medications as needed.  Return precautions  discussed.  Outpatient follow-up otherwise.  Final Clinical Impressions(s) / ED Diagnoses   Final diagnoses:  Abdominal pain, unspecified abdominal location  Nausea and vomiting, intractability of vomiting not specified, unspecified vomiting type  ED Discharge Orders    None       Virgel Manifold, MD 10/23/17 1036

## 2017-10-21 NOTE — ED Notes (Signed)
Pt states nausea improved, but pain continues to be 8/10.

## 2017-10-21 NOTE — ED Notes (Signed)
Patient is aware that she needs a urine sample and will use the call bell to alert with bathroom assistance.

## 2017-10-21 NOTE — ED Notes (Signed)
Pt placed on 2L [place] d/t sats of 90% after morphine given.

## 2017-10-22 ENCOUNTER — Encounter (HOSPITAL_COMMUNITY): Payer: Self-pay | Admitting: *Deleted

## 2017-10-22 ENCOUNTER — Emergency Department (HOSPITAL_COMMUNITY)
Admission: EM | Admit: 2017-10-22 | Discharge: 2017-10-22 | Disposition: A | Discharge status: Home / Self Care | Payer: Medicare Other | Source: Home / Self Care | Attending: Emergency Medicine | Admitting: Emergency Medicine

## 2017-10-22 ENCOUNTER — Emergency Department (HOSPITAL_COMMUNITY): Payer: Medicare Other

## 2017-10-22 ENCOUNTER — Other Ambulatory Visit: Payer: Self-pay

## 2017-10-22 ENCOUNTER — Encounter (HOSPITAL_COMMUNITY): Payer: Self-pay

## 2017-10-22 DIAGNOSIS — S61211A Laceration without foreign body of left index finger without damage to nail, initial encounter: Secondary | ICD-10-CM | POA: Diagnosis not present

## 2017-10-22 DIAGNOSIS — E860 Dehydration: Secondary | ICD-10-CM | POA: Diagnosis not present

## 2017-10-22 DIAGNOSIS — Y929 Unspecified place or not applicable: Secondary | ICD-10-CM

## 2017-10-22 DIAGNOSIS — Z881 Allergy status to other antibiotic agents status: Secondary | ICD-10-CM | POA: Diagnosis not present

## 2017-10-22 DIAGNOSIS — R112 Nausea with vomiting, unspecified: Secondary | ICD-10-CM

## 2017-10-22 DIAGNOSIS — E1122 Type 2 diabetes mellitus with diabetic chronic kidney disease: Secondary | ICD-10-CM | POA: Insufficient documentation

## 2017-10-22 DIAGNOSIS — S61219A Laceration without foreign body of unspecified finger without damage to nail, initial encounter: Secondary | ICD-10-CM

## 2017-10-22 DIAGNOSIS — Z8261 Family history of arthritis: Secondary | ICD-10-CM | POA: Diagnosis not present

## 2017-10-22 DIAGNOSIS — Y998 Other external cause status: Secondary | ICD-10-CM

## 2017-10-22 DIAGNOSIS — Z794 Long term (current) use of insulin: Secondary | ICD-10-CM

## 2017-10-22 DIAGNOSIS — E1043 Type 1 diabetes mellitus with diabetic autonomic (poly)neuropathy: Secondary | ICD-10-CM | POA: Diagnosis not present

## 2017-10-22 DIAGNOSIS — Z79899 Other long term (current) drug therapy: Secondary | ICD-10-CM | POA: Insufficient documentation

## 2017-10-22 DIAGNOSIS — E1042 Type 1 diabetes mellitus with diabetic polyneuropathy: Secondary | ICD-10-CM | POA: Diagnosis not present

## 2017-10-22 DIAGNOSIS — S50311A Abrasion of right elbow, initial encounter: Secondary | ICD-10-CM | POA: Insufficient documentation

## 2017-10-22 DIAGNOSIS — Z23 Encounter for immunization: Secondary | ICD-10-CM

## 2017-10-22 DIAGNOSIS — R1084 Generalized abdominal pain: Secondary | ICD-10-CM

## 2017-10-22 DIAGNOSIS — R609 Edema, unspecified: Secondary | ICD-10-CM | POA: Diagnosis not present

## 2017-10-22 DIAGNOSIS — R221 Localized swelling, mass and lump, neck: Secondary | ICD-10-CM | POA: Diagnosis not present

## 2017-10-22 DIAGNOSIS — N182 Chronic kidney disease, stage 2 (mild): Secondary | ICD-10-CM

## 2017-10-22 DIAGNOSIS — Z9049 Acquired absence of other specified parts of digestive tract: Secondary | ICD-10-CM | POA: Diagnosis not present

## 2017-10-22 DIAGNOSIS — F819 Developmental disorder of scholastic skills, unspecified: Secondary | ICD-10-CM | POA: Diagnosis not present

## 2017-10-22 DIAGNOSIS — Z91013 Allergy to seafood: Secondary | ICD-10-CM | POA: Diagnosis not present

## 2017-10-22 DIAGNOSIS — M546 Pain in thoracic spine: Secondary | ICD-10-CM

## 2017-10-22 DIAGNOSIS — G8929 Other chronic pain: Secondary | ICD-10-CM

## 2017-10-22 DIAGNOSIS — E1022 Type 1 diabetes mellitus with diabetic chronic kidney disease: Secondary | ICD-10-CM

## 2017-10-22 DIAGNOSIS — Z944 Liver transplant status: Secondary | ICD-10-CM | POA: Diagnosis not present

## 2017-10-22 DIAGNOSIS — Q970 Karyotype 47, XXX: Secondary | ICD-10-CM | POA: Diagnosis not present

## 2017-10-22 DIAGNOSIS — Z91018 Allergy to other foods: Secondary | ICD-10-CM | POA: Diagnosis not present

## 2017-10-22 DIAGNOSIS — N179 Acute kidney failure, unspecified: Secondary | ICD-10-CM | POA: Diagnosis not present

## 2017-10-22 DIAGNOSIS — R109 Unspecified abdominal pain: Secondary | ICD-10-CM

## 2017-10-22 DIAGNOSIS — I129 Hypertensive chronic kidney disease with stage 1 through stage 4 chronic kidney disease, or unspecified chronic kidney disease: Secondary | ICD-10-CM | POA: Insufficient documentation

## 2017-10-22 DIAGNOSIS — R202 Paresthesia of skin: Secondary | ICD-10-CM | POA: Diagnosis not present

## 2017-10-22 DIAGNOSIS — N183 Chronic kidney disease, stage 3 (moderate): Secondary | ICD-10-CM | POA: Diagnosis not present

## 2017-10-22 DIAGNOSIS — S0993XA Unspecified injury of face, initial encounter: Secondary | ICD-10-CM | POA: Diagnosis not present

## 2017-10-22 DIAGNOSIS — S61201A Unspecified open wound of left index finger without damage to nail, initial encounter: Secondary | ICD-10-CM

## 2017-10-22 DIAGNOSIS — S0511XA Contusion of eyeball and orbital tissues, right eye, initial encounter: Secondary | ICD-10-CM

## 2017-10-22 DIAGNOSIS — T8619 Other complication of kidney transplant: Secondary | ICD-10-CM | POA: Diagnosis not present

## 2017-10-22 DIAGNOSIS — K3184 Gastroparesis: Secondary | ICD-10-CM | POA: Diagnosis not present

## 2017-10-22 DIAGNOSIS — E785 Hyperlipidemia, unspecified: Secondary | ICD-10-CM | POA: Diagnosis not present

## 2017-10-22 DIAGNOSIS — E876 Hypokalemia: Secondary | ICD-10-CM | POA: Diagnosis not present

## 2017-10-22 DIAGNOSIS — D631 Anemia in chronic kidney disease: Secondary | ICD-10-CM | POA: Diagnosis not present

## 2017-10-22 DIAGNOSIS — Z940 Kidney transplant status: Secondary | ICD-10-CM

## 2017-10-22 DIAGNOSIS — R000 Tachycardia, unspecified: Secondary | ICD-10-CM | POA: Insufficient documentation

## 2017-10-22 DIAGNOSIS — F445 Conversion disorder with seizures or convulsions: Secondary | ICD-10-CM | POA: Diagnosis not present

## 2017-10-22 DIAGNOSIS — Y9389 Activity, other specified: Secondary | ICD-10-CM

## 2017-10-22 DIAGNOSIS — S61209A Unspecified open wound of unspecified finger without damage to nail, initial encounter: Secondary | ICD-10-CM

## 2017-10-22 DIAGNOSIS — Z79891 Long term (current) use of opiate analgesic: Secondary | ICD-10-CM | POA: Diagnosis not present

## 2017-10-22 DIAGNOSIS — S50319A Abrasion of unspecified elbow, initial encounter: Secondary | ICD-10-CM

## 2017-10-22 DIAGNOSIS — M79675 Pain in left toe(s): Secondary | ICD-10-CM | POA: Diagnosis not present

## 2017-10-22 DIAGNOSIS — S0011XA Contusion of right eyelid and periocular area, initial encounter: Secondary | ICD-10-CM | POA: Diagnosis not present

## 2017-10-22 DIAGNOSIS — Y830 Surgical operation with transplant of whole organ as the cause of abnormal reaction of the patient, or of later complication, without mention of misadventure at the time of the procedure: Secondary | ICD-10-CM | POA: Diagnosis not present

## 2017-10-22 DIAGNOSIS — R1111 Vomiting without nausea: Secondary | ICD-10-CM | POA: Diagnosis not present

## 2017-10-22 LAB — CBG MONITORING, ED: GLUCOSE-CAPILLARY: 161 mg/dL — AB (ref 70–99)

## 2017-10-22 MED ORDER — LORAZEPAM 2 MG/ML IJ SOLN
1.0000 mg | Freq: Once | INTRAMUSCULAR | Status: AC
  Administered 2017-10-22: 1 mg via INTRAMUSCULAR
  Filled 2017-10-22: qty 1

## 2017-10-22 MED ORDER — GI COCKTAIL ~~LOC~~
30.0000 mL | Freq: Once | ORAL | Status: AC
  Administered 2017-10-22: 30 mL via ORAL
  Filled 2017-10-22: qty 30

## 2017-10-22 MED ORDER — DICYCLOMINE HCL 10 MG PO CAPS
10.0000 mg | ORAL_CAPSULE | Freq: Once | ORAL | Status: AC
  Administered 2017-10-22: 10 mg via ORAL
  Filled 2017-10-22: qty 1

## 2017-10-22 MED ORDER — TETANUS-DIPHTH-ACELL PERTUSSIS 5-2.5-18.5 LF-MCG/0.5 IM SUSP
0.5000 mL | Freq: Once | INTRAMUSCULAR | Status: AC
  Administered 2017-10-22: 0.5 mL via INTRAMUSCULAR
  Filled 2017-10-22: qty 0.5

## 2017-10-22 MED ORDER — METOCLOPRAMIDE HCL 5 MG/ML IJ SOLN
10.0000 mg | Freq: Once | INTRAMUSCULAR | Status: AC
  Administered 2017-10-22: 10 mg via INTRAMUSCULAR
  Filled 2017-10-22: qty 2

## 2017-10-22 MED ORDER — DICYCLOMINE HCL 20 MG PO TABS: 20.0000 mg | ORAL_TABLET | Freq: Two times a day (BID) | ORAL | 0 refills | Status: DC

## 2017-10-22 MED ORDER — METOCLOPRAMIDE HCL 10 MG PO TABS: 10.0000 mg | ORAL_TABLET | Freq: Three times a day (TID) | ORAL | 0 refills | Status: DC | PRN

## 2017-10-22 NOTE — ED Notes (Signed)
Pt crying stating meds aren't working, Bentyl 10mg  po given with encouragement of how well this drug works to held abd pain. Pt looks at pill "I/m going to throw it up" Pt took pill with sip of water and was told to relax and take deep breaths in order for pill to stay down.
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
## 2017-10-22 NOTE — ED Notes (Signed)
Pt continues to state she has not received any relief, she states she drank a little water but threw it back up.

## 2017-10-22 NOTE — Discharge Instructions (Addendum)
Your x-rays were reassuring.  No face, back or toe fracture.  Your tetanus shot was updated in the ER today.  Please gently wash wounds with warm soapy water starting tomorrow.  Return to the ER if you have any new or concerning symptoms like noticed redness or swelling near the wounds, or have a fever.

## 2017-10-22 NOTE — ED Provider Notes (Addendum)
Patient signed out to me by Lucrezia Starch, PA-C.  Please see previous notes for further history.  In brief, patient with a history of type 1 diabetes and previous renal transplant presenting with chronic abdominal pain.  She was seen yesterday for the same, had a normal CT.  Upon EMS arrival to the scene, patient was seen with her fingers down her throat trying to induce vomiting.  She has not vomited while in the ER.  She is mildly tachycardic, however patient has mild tachycardia at baseline.  Patient has received Reglan, Ativan, and Bentyl.  Is attempting p.o. challenge.  Was evaluated by the attending, Dr. Zenia Resides agrees that no further work-up is necessary if patient can tolerate p.o.  On reassessment, patient is tolerating room temperature water without difficulty.  Reports she is still having pain, although agrees that this is her chronic abdominal pain. On reevaluation, pt lying comfortably. Appears in no distress.  When asked if she has a GI doctor, patient is unsure who she follows with.  Per chart review, patient was supposed to follow-up with Dr. Watt Climes with [facility], resources given for follow-up.  GI cocktail given for further discomfort.  At this time, patient appears safe for discharge.  Return precautions given.  Patient states she understands.      Franchot Heidelberg, PA-C 10/22/17 1739    Sherwood Gambler, MD 10/22/17 [phone]

## 2017-10-22 NOTE — ED Triage Notes (Addendum)
PT DISCHARGED AT 1150 TODAY AND HAS RETURNED FOR THE 5TH TIME THIS MONTH. SHE HAS CHRONIC ABD PAIN WHICH IS WHY SHE HAS RETURNED. CBG 140, told EMS abd pain (stomach still hurts) 144/76-104-20 EMS WITNESSED PT STICKING FINGER DOWN THROAT TO MAKE HERSELF VOMIT.

## 2017-10-22 NOTE — ED Notes (Signed)
Bed: JW90 Expected date:  Expected time:  Means of arrival:  Comments: 32 yo abd pain- seen at this facility for the same

## 2017-10-22 NOTE — ED Notes (Signed)
Pt requested room temp water. Pt was encouraged to sip and try to keep the water down.

## 2017-10-22 NOTE — Discharge Instructions (Addendum)
Continue taking home medication as prescribed.  Use bentyl and reglan as needed for nausea/vomiting.  It is important that you follow up with the stomach doctors for further evaluation and management of your pain.  Return to the ER if you develop any new, worsening, or concerning symptoms.
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
## 2017-10-24 ENCOUNTER — Inpatient Hospital Stay (HOSPITAL_COMMUNITY)
Admission: EM | Admit: 2017-10-24 | Discharge: 2017-10-26 | DRG: 699 | Disposition: A | Discharge status: Home / Self Care | Payer: Medicare Other | Attending: Family Medicine | Admitting: Family Medicine

## 2017-10-24 ENCOUNTER — Emergency Department (HOSPITAL_COMMUNITY)
Admission: EM | Admit: 2017-10-24 | Discharge: 2017-10-24 | Disposition: A | Discharge status: Home / Self Care | Payer: Medicare Other | Source: Home / Self Care | Attending: Emergency Medicine | Admitting: Emergency Medicine

## 2017-10-24 ENCOUNTER — Encounter (HOSPITAL_COMMUNITY): Payer: Self-pay | Admitting: Emergency Medicine

## 2017-10-24 ENCOUNTER — Other Ambulatory Visit: Payer: Self-pay

## 2017-10-24 DIAGNOSIS — T8619 Other complication of kidney transplant: Principal | ICD-10-CM | POA: Diagnosis present

## 2017-10-24 DIAGNOSIS — K209 Esophagitis, unspecified: Secondary | ICD-10-CM | POA: Diagnosis present

## 2017-10-24 DIAGNOSIS — R1084 Generalized abdominal pain: Secondary | ICD-10-CM | POA: Diagnosis not present

## 2017-10-24 DIAGNOSIS — Z9049 Acquired absence of other specified parts of digestive tract: Secondary | ICD-10-CM

## 2017-10-24 DIAGNOSIS — Q970 Karyotype 47, XXX: Secondary | ICD-10-CM

## 2017-10-24 DIAGNOSIS — R52 Pain, unspecified: Secondary | ICD-10-CM | POA: Insufficient documentation

## 2017-10-24 DIAGNOSIS — N186 End stage renal disease: Secondary | ICD-10-CM | POA: Insufficient documentation

## 2017-10-24 DIAGNOSIS — Y929 Unspecified place or not applicable: Secondary | ICD-10-CM

## 2017-10-24 DIAGNOSIS — IMO0002 Reserved for concepts with insufficient information to code with codable children: Secondary | ICD-10-CM | POA: Diagnosis present

## 2017-10-24 DIAGNOSIS — E876 Hypokalemia: Secondary | ICD-10-CM | POA: Diagnosis present

## 2017-10-24 DIAGNOSIS — Y999 Unspecified external cause status: Secondary | ICD-10-CM | POA: Insufficient documentation

## 2017-10-24 DIAGNOSIS — F79 Unspecified intellectual disabilities: Secondary | ICD-10-CM | POA: Insufficient documentation

## 2017-10-24 DIAGNOSIS — N183 Chronic kidney disease, stage 3 (moderate): Secondary | ICD-10-CM | POA: Diagnosis not present

## 2017-10-24 DIAGNOSIS — Y939 Activity, unspecified: Secondary | ICD-10-CM | POA: Insufficient documentation

## 2017-10-24 DIAGNOSIS — R112 Nausea with vomiting, unspecified: Secondary | ICD-10-CM | POA: Diagnosis present

## 2017-10-24 DIAGNOSIS — Z23 Encounter for immunization: Secondary | ICD-10-CM | POA: Diagnosis not present

## 2017-10-24 DIAGNOSIS — Z91013 Allergy to seafood: Secondary | ICD-10-CM

## 2017-10-24 DIAGNOSIS — D849 Immunodeficiency, unspecified: Secondary | ICD-10-CM | POA: Diagnosis present

## 2017-10-24 DIAGNOSIS — Z79891 Long term (current) use of opiate analgesic: Secondary | ICD-10-CM

## 2017-10-24 DIAGNOSIS — R109 Unspecified abdominal pain: Principal | ICD-10-CM

## 2017-10-24 DIAGNOSIS — Z794 Long term (current) use of insulin: Secondary | ICD-10-CM

## 2017-10-24 DIAGNOSIS — G8929 Other chronic pain: Secondary | ICD-10-CM | POA: Diagnosis not present

## 2017-10-24 DIAGNOSIS — Z7952 Long term (current) use of systemic steroids: Secondary | ICD-10-CM

## 2017-10-24 DIAGNOSIS — I120 Hypertensive chronic kidney disease with stage 5 chronic kidney disease or end stage renal disease: Secondary | ICD-10-CM

## 2017-10-24 DIAGNOSIS — Z8261 Family history of arthritis: Secondary | ICD-10-CM

## 2017-10-24 DIAGNOSIS — D899 Disorder involving the immune mechanism, unspecified: Secondary | ICD-10-CM

## 2017-10-24 DIAGNOSIS — Z940 Kidney transplant status: Secondary | ICD-10-CM

## 2017-10-24 DIAGNOSIS — Z944 Liver transplant status: Secondary | ICD-10-CM

## 2017-10-24 DIAGNOSIS — F819 Developmental disorder of scholastic skills, unspecified: Secondary | ICD-10-CM | POA: Diagnosis present

## 2017-10-24 DIAGNOSIS — Z881 Allergy status to other antibiotic agents status: Secondary | ICD-10-CM

## 2017-10-24 DIAGNOSIS — N189 Chronic kidney disease, unspecified: Secondary | ICD-10-CM | POA: Diagnosis present

## 2017-10-24 DIAGNOSIS — E1043 Type 1 diabetes mellitus with diabetic autonomic (poly)neuropathy: Secondary | ICD-10-CM | POA: Diagnosis present

## 2017-10-24 DIAGNOSIS — E1122 Type 2 diabetes mellitus with diabetic chronic kidney disease: Secondary | ICD-10-CM

## 2017-10-24 DIAGNOSIS — Z79899 Other long term (current) drug therapy: Secondary | ICD-10-CM

## 2017-10-24 DIAGNOSIS — E1042 Type 1 diabetes mellitus with diabetic polyneuropathy: Secondary | ICD-10-CM | POA: Diagnosis present

## 2017-10-24 DIAGNOSIS — Z8249 Family history of ischemic heart disease and other diseases of the circulatory system: Secondary | ICD-10-CM

## 2017-10-24 DIAGNOSIS — N179 Acute kidney failure, unspecified: Secondary | ICD-10-CM | POA: Diagnosis not present

## 2017-10-24 DIAGNOSIS — D631 Anemia in chronic kidney disease: Secondary | ICD-10-CM | POA: Diagnosis not present

## 2017-10-24 DIAGNOSIS — I10 Essential (primary) hypertension: Secondary | ICD-10-CM | POA: Diagnosis present

## 2017-10-24 DIAGNOSIS — Z91018 Allergy to other foods: Secondary | ICD-10-CM

## 2017-10-24 DIAGNOSIS — E109 Type 1 diabetes mellitus without complications: Secondary | ICD-10-CM | POA: Diagnosis present

## 2017-10-24 DIAGNOSIS — Z833 Family history of diabetes mellitus: Secondary | ICD-10-CM

## 2017-10-24 DIAGNOSIS — F445 Conversion disorder with seizures or convulsions: Secondary | ICD-10-CM | POA: Diagnosis present

## 2017-10-24 DIAGNOSIS — E785 Hyperlipidemia, unspecified: Secondary | ICD-10-CM | POA: Diagnosis present

## 2017-10-24 DIAGNOSIS — I129 Hypertensive chronic kidney disease with stage 1 through stage 4 chronic kidney disease, or unspecified chronic kidney disease: Secondary | ICD-10-CM | POA: Diagnosis present

## 2017-10-24 DIAGNOSIS — E108 Type 1 diabetes mellitus with unspecified complications: Secondary | ICD-10-CM

## 2017-10-24 DIAGNOSIS — E1065 Type 1 diabetes mellitus with hyperglycemia: Secondary | ICD-10-CM | POA: Diagnosis present

## 2017-10-24 DIAGNOSIS — E860 Dehydration: Secondary | ICD-10-CM | POA: Diagnosis present

## 2017-10-24 DIAGNOSIS — Y830 Surgical operation with transplant of whole organ as the cause of abnormal reaction of the patient, or of later complication, without mention of misadventure at the time of the procedure: Secondary | ICD-10-CM | POA: Diagnosis present

## 2017-10-24 DIAGNOSIS — E1022 Type 1 diabetes mellitus with diabetic chronic kidney disease: Secondary | ICD-10-CM | POA: Diagnosis present

## 2017-10-24 DIAGNOSIS — K3184 Gastroparesis: Secondary | ICD-10-CM | POA: Diagnosis present

## 2017-10-24 LAB — CBC WITH DIFFERENTIAL/PLATELET
Basophils Absolute: 0 10*3/uL (ref 0.0–0.1)
Basophils Relative: 0 %
EOS ABS: 0.2 10*3/uL (ref 0.0–0.7)
EOS PCT: 2 %
HCT: 36.7 % (ref 36.0–46.0)
Hemoglobin: 11.6 g/dL — ABNORMAL LOW (ref 12.0–15.0)
LYMPHS ABS: 1.6 10*3/uL (ref 0.7–4.0)
Lymphocytes Relative: 18 %
MCH: 28.8 pg (ref 26.0–34.0)
MCHC: 31.6 g/dL (ref 30.0–36.0)
MCV: 91.1 fL (ref 78.0–100.0)
Monocytes Absolute: 0.8 10*3/uL (ref 0.1–1.0)
Monocytes Relative: 9 %
Neutro Abs: 6.3 10*3/uL (ref 1.7–7.7)
Neutrophils Relative %: 71 %
PLATELETS: 239 10*3/uL (ref 150–400)
RBC: 4.03 MIL/uL (ref 3.87–5.11)
RDW: 15.4 % (ref 11.5–15.5)
WBC: 9 10*3/uL (ref 4.0–10.5)

## 2017-10-24 LAB — COMPREHENSIVE METABOLIC PANEL
ALT: 16 U/L (ref 0–44)
ALT: 13 U/L (ref 0–44)
ANION GAP: 13 (ref 5–15)
AST: 15 U/L (ref 15–41)
AST: 14 U/L — AB (ref 15–41)
Albumin: 3.7 g/dL (ref 3.5–5.0)
Albumin: 3.3 g/dL — ABNORMAL LOW (ref 3.5–5.0)
Alkaline Phosphatase: 53 U/L (ref 38–126)
Alkaline Phosphatase: 54 U/L (ref 38–126)
Anion gap: 11 (ref 5–15)
BILIRUBIN TOTAL: 0.7 mg/dL (ref 0.3–1.2)
BUN: 39 mg/dL — ABNORMAL HIGH (ref 6–20)
BUN: 35 mg/dL — AB (ref 6–20)
CALCIUM: 7 mg/dL — AB (ref 8.9–10.3)
CO2: 23 mmol/L (ref 22–32)
CO2: 22 mmol/L (ref 22–32)
CREATININE: 2.11 mg/dL — AB (ref 0.44–1.00)
Calcium: 6.5 mg/dL — ABNORMAL LOW (ref 8.9–10.3)
Chloride: 108 mmol/L (ref 98–111)
Chloride: 108 mmol/L (ref 98–111)
Creatinine, Ser: 1.96 mg/dL — ABNORMAL HIGH (ref 0.44–1.00)
GFR calc Af Amer: 37 mL/min — ABNORMAL LOW (ref >60)
GFR calc Af Amer: 34 mL/min — ABNORMAL LOW (ref >60)
GFR, EST NON AFRICAN AMERICAN: 32 mL/min — AB (ref >60)
GFR, EST NON AFRICAN AMERICAN: 29 mL/min — AB (ref >60)
Glucose, Bld: 173 mg/dL — ABNORMAL HIGH (ref 70–99)
Glucose, Bld: 281 mg/dL — ABNORMAL HIGH (ref 70–99)
POTASSIUM: 3.8 mmol/L (ref 3.5–5.1)
Potassium: 4.2 mmol/L (ref 3.5–5.1)
SODIUM: 141 mmol/L (ref 135–145)
Sodium: 144 mmol/L (ref 135–145)
TOTAL PROTEIN: 7.2 g/dL (ref 6.5–8.1)
Total Bilirubin: 0.8 mg/dL (ref 0.3–1.2)
Total Protein: 6.7 g/dL (ref 6.5–8.1)

## 2017-10-24 LAB — URINALYSIS, ROUTINE W REFLEX MICROSCOPIC
BILIRUBIN URINE: NEGATIVE
Glucose, UA: NEGATIVE mg/dL
HGB URINE DIPSTICK: NEGATIVE
Ketones, ur: 5 mg/dL — AB
Nitrite: NEGATIVE
PH: 5 (ref 5.0–8.0)
Protein, ur: 100 mg/dL — AB
SPECIFIC GRAVITY, URINE: 1.015 (ref 1.005–1.030)

## 2017-10-24 LAB — LIPASE, BLOOD
Lipase: 37 U/L (ref 11–51)
Lipase: 40 U/L (ref 11–51)

## 2017-10-24 LAB — CBC
HCT: 35 % — ABNORMAL LOW (ref 36.0–46.0)
Hemoglobin: 10.5 g/dL — ABNORMAL LOW (ref 12.0–15.0)
MCH: 28.4 pg (ref 26.0–34.0)
MCHC: 30 g/dL (ref 30.0–36.0)
MCV: 94.6 fL (ref 78.0–100.0)
PLATELETS: 219 10*3/uL (ref 150–400)
RBC: 3.7 MIL/uL — ABNORMAL LOW (ref 3.87–5.11)
RDW: 15.3 % (ref 11.5–15.5)
WBC: 7.9 10*3/uL (ref 4.0–10.5)

## 2017-10-24 LAB — I-STAT BETA HCG BLOOD, ED (MC, WL, AP ONLY): I-stat hCG, quantitative: <5 m[IU]/mL (ref <5)

## 2017-10-24 LAB — CBG MONITORING, ED: Glucose-Capillary: 149 mg/dL — ABNORMAL HIGH (ref 70–99)

## 2017-10-24 LAB — RAPID URINE DRUG SCREEN, HOSP PERFORMED
AMPHETAMINES: NOT DETECTED
Barbiturates: NOT DETECTED
Benzodiazepines: NOT DETECTED
Cocaine: NOT DETECTED
OPIATES: POSITIVE — AB
Tetrahydrocannabinol: NOT DETECTED

## 2017-10-24 LAB — HEMOGLOBIN A1C
HEMOGLOBIN A1C: 8.3 % — AB (ref 4.8–5.6)
Mean Plasma Glucose: 191.51 mg/dL

## 2017-10-24 LAB — GLUCOSE, CAPILLARY: GLUCOSE-CAPILLARY: 177 mg/dL — AB (ref 70–99)

## 2017-10-24 MED ORDER — SODIUM CHLORIDE 0.9 % IV BOLUS
500.0000 mL | Freq: Once | INTRAVENOUS | Status: AC
  Administered 2017-10-24: 500 mL via INTRAVENOUS

## 2017-10-24 MED ORDER — ONDANSETRON HCL 4 MG/2ML IJ SOLN
4.0000 mg | Freq: Once | INTRAMUSCULAR | Status: AC
  Administered 2017-10-24: 4 mg via INTRAVENOUS
  Filled 2017-10-24: qty 2

## 2017-10-24 MED ORDER — PREDNISONE 20 MG PO TABS
10.0000 mg | ORAL_TABLET | Freq: Once | ORAL | Status: AC
  Administered 2017-10-24: 10 mg via ORAL
  Filled 2017-10-24: qty 1

## 2017-10-24 MED ORDER — HYDROMORPHONE HCL 1 MG/ML IJ SOLN: 0.5000 mg | Freq: Once | INTRAMUSCULAR | Status: DC

## 2017-10-24 MED ORDER — FENTANYL CITRATE (PF) 100 MCG/2ML IJ SOLN
100.0000 ug | Freq: Once | INTRAMUSCULAR | Status: AC
  Administered 2017-10-24: 100 ug via INTRAVENOUS
  Filled 2017-10-24: qty 2

## 2017-10-24 MED ORDER — ONDANSETRON HCL 4 MG/2ML IJ SOLN
4.0000 mg | Freq: Four times a day (QID) | INTRAMUSCULAR | Status: DC | PRN
  Administered 2017-10-25 – 2017-10-26 (×2): 4 mg via INTRAVENOUS
  Filled 2017-10-24 (×3): qty 2

## 2017-10-24 MED ORDER — OXYCODONE-ACETAMINOPHEN 5-325 MG PO TABS: 1.0000 | ORAL_TABLET | ORAL | 0 refills | Status: DC | PRN

## 2017-10-24 MED ORDER — ACETAMINOPHEN 650 MG RE SUPP: 650.0000 mg | Freq: Four times a day (QID) | RECTAL | Status: DC | PRN

## 2017-10-24 MED ORDER — INSULIN ASPART 100 UNIT/ML ~~LOC~~ SOLN
0.0000 [IU] | Freq: Three times a day (TID) | SUBCUTANEOUS | Status: DC
  Administered 2017-10-25: 1 [IU] via SUBCUTANEOUS
  Administered 2017-10-25: 2 [IU] via SUBCUTANEOUS

## 2017-10-24 MED ORDER — MORPHINE SULFATE (PF) 4 MG/ML IV SOLN
4.0000 mg | Freq: Once | INTRAVENOUS | Status: AC
  Administered 2017-10-24: 4 mg via INTRAVENOUS
  Filled 2017-10-24: qty 1

## 2017-10-24 MED ORDER — DICYCLOMINE HCL 20 MG PO TABS
20.0000 mg | ORAL_TABLET | Freq: Two times a day (BID) | ORAL | Status: DC
  Administered 2017-10-25 (×2): 20 mg via ORAL
  Filled 2017-10-24 (×4): qty 1

## 2017-10-24 MED ORDER — SODIUM CHLORIDE 0.9 % IV SOLN
INTRAVENOUS | Status: DC
  Administered 2017-10-24 – 2017-10-26 (×2): via INTRAVENOUS

## 2017-10-24 MED ORDER — MYCOPHENOLATE SODIUM 180 MG PO TBEC
720.0000 mg | DELAYED_RELEASE_TABLET | Freq: Two times a day (BID) | ORAL | Status: DC
  Administered 2017-10-25 (×2): 720 mg via ORAL
  Filled 2017-10-24 (×4): qty 4

## 2017-10-24 MED ORDER — ENOXAPARIN SODIUM 40 MG/0.4ML ~~LOC~~ SOLN
40.0000 mg | SUBCUTANEOUS | Status: DC
  Administered 2017-10-24 – 2017-10-25 (×2): 40 mg via SUBCUTANEOUS
  Filled 2017-10-24 (×2): qty 0.4

## 2017-10-24 MED ORDER — METOCLOPRAMIDE HCL 5 MG/ML IJ SOLN
10.0000 mg | Freq: Three times a day (TID) | INTRAMUSCULAR | Status: DC
  Administered 2017-10-24 – 2017-10-25 (×2): 10 mg via INTRAVENOUS
  Filled 2017-10-24 (×2): qty 2

## 2017-10-24 MED ORDER — TACROLIMUS 1 MG PO CAPS
3.0000 mg | ORAL_CAPSULE | Freq: Once | ORAL | Status: AC
  Administered 2017-10-24: 3 mg via ORAL
  Filled 2017-10-24: qty 3

## 2017-10-24 MED ORDER — HYDROCODONE-ACETAMINOPHEN 5-325 MG PO TABS
2.0000 | ORAL_TABLET | Freq: Once | ORAL | Status: AC
  Administered 2017-10-24: 2 via ORAL
  Filled 2017-10-24: qty 2

## 2017-10-24 MED ORDER — TACROLIMUS 1 MG PO CAPS
3.0000 mg | ORAL_CAPSULE | Freq: Two times a day (BID) | ORAL | Status: DC
  Administered 2017-10-25 (×2): 3 mg via ORAL
  Filled 2017-10-24 (×4): qty 3

## 2017-10-24 MED ORDER — ONDANSETRON 4 MG PO TBDP
4.0000 mg | ORAL_TABLET | Freq: Once | ORAL | Status: AC
  Administered 2017-10-24: 4 mg via ORAL
  Filled 2017-10-24: qty 1

## 2017-10-24 MED ORDER — MYCOPHENOLATE SODIUM 180 MG PO TBEC
360.0000 mg | DELAYED_RELEASE_TABLET | Freq: Once | ORAL | Status: AC
  Administered 2017-10-24: 360 mg via ORAL
  Filled 2017-10-24: qty 2

## 2017-10-24 MED ORDER — FAMOTIDINE IN NACL 20-0.9 MG/50ML-% IV SOLN
20.0000 mg | Freq: Two times a day (BID) | INTRAVENOUS | Status: DC
  Administered 2017-10-24 – 2017-10-26 (×4): 20 mg via INTRAVENOUS
  Filled 2017-10-24 (×4): qty 50

## 2017-10-24 MED ORDER — SODIUM CHLORIDE 0.9 % IV BOLUS
1000.0000 mL | Freq: Once | INTRAVENOUS | Status: AC
  Administered 2017-10-24: 1000 mL via INTRAVENOUS

## 2017-10-24 MED ORDER — ACETAMINOPHEN 325 MG PO TABS: 650.0000 mg | ORAL_TABLET | Freq: Four times a day (QID) | ORAL | Status: DC | PRN

## 2017-10-24 MED ORDER — MORPHINE SULFATE (PF) 4 MG/ML IV SOLN
4.0000 mg | Freq: Once | INTRAVENOUS | Status: AC
  Administered 2017-10-24: 4 mg via INTRAMUSCULAR
  Filled 2017-10-24: qty 1

## 2017-10-24 MED ORDER — HYDROMORPHONE HCL 1 MG/ML IJ SOLN
1.0000 mg | INTRAMUSCULAR | Status: DC | PRN
  Administered 2017-10-24 – 2017-10-25 (×2): 1 mg via INTRAVENOUS
  Filled 2017-10-24 (×2): qty 1

## 2017-10-24 MED ORDER — PREDNISONE 5 MG PO TABS
5.0000 mg | ORAL_TABLET | Freq: Every day | ORAL | Status: DC
  Administered 2017-10-25: 5 mg via ORAL
  Filled 2017-10-24 (×2): qty 1

## 2017-10-24 NOTE — ED Notes (Signed)
Urine culture sent down with UA. 

## 2017-10-24 NOTE — ED Notes (Signed)
ED Provider at bedside. 
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
## 2017-10-24 NOTE — ED Triage Notes (Signed)
Patient complains of abdominal pain, emesis and flank pain, seen multiple times over the last week. History of kidney transplant in 2007. Patient seen at PCP, was told there she needed to come to ED and be admitted. Patient alert, oriented, and in no apparent distress at this time.

## 2017-10-24 NOTE — ED Notes (Signed)
Pt reported that she gets nauseated when taking pain medication. Pt reports that she normally does not eat anything when taking medication. Informed Pt the importance of eating before taking medication. Supplied Pt with applesauce, crackers, and ginger ale with pain medication before discharge.
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
## 2017-10-25 DIAGNOSIS — Z8261 Family history of arthritis: Secondary | ICD-10-CM | POA: Diagnosis not present

## 2017-10-25 DIAGNOSIS — Z940 Kidney transplant status: Secondary | ICD-10-CM

## 2017-10-25 DIAGNOSIS — F445 Conversion disorder with seizures or convulsions: Secondary | ICD-10-CM | POA: Diagnosis present

## 2017-10-25 DIAGNOSIS — R109 Unspecified abdominal pain: Secondary | ICD-10-CM | POA: Diagnosis not present

## 2017-10-25 DIAGNOSIS — E785 Hyperlipidemia, unspecified: Secondary | ICD-10-CM | POA: Diagnosis present

## 2017-10-25 DIAGNOSIS — K3184 Gastroparesis: Secondary | ICD-10-CM | POA: Diagnosis present

## 2017-10-25 DIAGNOSIS — Z91018 Allergy to other foods: Secondary | ICD-10-CM | POA: Diagnosis not present

## 2017-10-25 DIAGNOSIS — E876 Hypokalemia: Secondary | ICD-10-CM | POA: Diagnosis present

## 2017-10-25 DIAGNOSIS — Z23 Encounter for immunization: Secondary | ICD-10-CM | POA: Diagnosis not present

## 2017-10-25 DIAGNOSIS — Z79891 Long term (current) use of opiate analgesic: Secondary | ICD-10-CM | POA: Diagnosis not present

## 2017-10-25 DIAGNOSIS — E1065 Type 1 diabetes mellitus with hyperglycemia: Secondary | ICD-10-CM | POA: Diagnosis not present

## 2017-10-25 DIAGNOSIS — R112 Nausea with vomiting, unspecified: Secondary | ICD-10-CM | POA: Diagnosis not present

## 2017-10-25 DIAGNOSIS — I129 Hypertensive chronic kidney disease with stage 1 through stage 4 chronic kidney disease, or unspecified chronic kidney disease: Secondary | ICD-10-CM | POA: Diagnosis present

## 2017-10-25 DIAGNOSIS — Z944 Liver transplant status: Secondary | ICD-10-CM | POA: Diagnosis not present

## 2017-10-25 DIAGNOSIS — Z91013 Allergy to seafood: Secondary | ICD-10-CM | POA: Diagnosis not present

## 2017-10-25 DIAGNOSIS — Q970 Karyotype 47, XXX: Secondary | ICD-10-CM | POA: Diagnosis not present

## 2017-10-25 DIAGNOSIS — N183 Chronic kidney disease, stage 3 (moderate): Secondary | ICD-10-CM | POA: Diagnosis present

## 2017-10-25 DIAGNOSIS — E1022 Type 1 diabetes mellitus with diabetic chronic kidney disease: Secondary | ICD-10-CM | POA: Diagnosis present

## 2017-10-25 DIAGNOSIS — E1043 Type 1 diabetes mellitus with diabetic autonomic (poly)neuropathy: Secondary | ICD-10-CM | POA: Diagnosis present

## 2017-10-25 DIAGNOSIS — Y830 Surgical operation with transplant of whole organ as the cause of abnormal reaction of the patient, or of later complication, without mention of misadventure at the time of the procedure: Secondary | ICD-10-CM | POA: Diagnosis present

## 2017-10-25 DIAGNOSIS — T8619 Other complication of kidney transplant: Secondary | ICD-10-CM | POA: Diagnosis present

## 2017-10-25 DIAGNOSIS — D899 Disorder involving the immune mechanism, unspecified: Secondary | ICD-10-CM | POA: Diagnosis not present

## 2017-10-25 DIAGNOSIS — N179 Acute kidney failure, unspecified: Secondary | ICD-10-CM | POA: Diagnosis not present

## 2017-10-25 DIAGNOSIS — E860 Dehydration: Secondary | ICD-10-CM | POA: Diagnosis present

## 2017-10-25 DIAGNOSIS — Z881 Allergy status to other antibiotic agents status: Secondary | ICD-10-CM | POA: Diagnosis not present

## 2017-10-25 DIAGNOSIS — E108 Type 1 diabetes mellitus with unspecified complications: Secondary | ICD-10-CM | POA: Diagnosis not present

## 2017-10-25 DIAGNOSIS — D631 Anemia in chronic kidney disease: Secondary | ICD-10-CM | POA: Diagnosis present

## 2017-10-25 DIAGNOSIS — F819 Developmental disorder of scholastic skills, unspecified: Secondary | ICD-10-CM | POA: Diagnosis present

## 2017-10-25 DIAGNOSIS — E1042 Type 1 diabetes mellitus with diabetic polyneuropathy: Secondary | ICD-10-CM | POA: Diagnosis present

## 2017-10-25 DIAGNOSIS — Z9049 Acquired absence of other specified parts of digestive tract: Secondary | ICD-10-CM | POA: Diagnosis not present

## 2017-10-25 LAB — CBC
HCT: 31.4 % — ABNORMAL LOW (ref 36.0–46.0)
Hemoglobin: 9.4 g/dL — ABNORMAL LOW (ref 12.0–15.0)
MCH: 28.3 pg (ref 26.0–34.0)
MCHC: 29.9 g/dL — ABNORMAL LOW (ref 30.0–36.0)
MCV: 94.6 fL (ref 78.0–100.0)
Platelets: 187 K/uL (ref 150–400)
RBC: 3.32 MIL/uL — ABNORMAL LOW (ref 3.87–5.11)
RDW: 14.9 % (ref 11.5–15.5)
WBC: 7.4 K/uL (ref 4.0–10.5)

## 2017-10-25 LAB — COMPREHENSIVE METABOLIC PANEL
ALT: 12 U/L (ref 0–44)
AST: 13 U/L — ABNORMAL LOW (ref 15–41)
Albumin: 2.9 g/dL — ABNORMAL LOW (ref 3.5–5.0)
Alkaline Phosphatase: 42 U/L (ref 38–126)
Anion gap: 9 (ref 5–15)
BILIRUBIN TOTAL: 0.6 mg/dL (ref 0.3–1.2)
BUN: 33 mg/dL — ABNORMAL HIGH (ref 6–20)
CHLORIDE: 112 mmol/L — AB (ref 98–111)
CO2: 22 mmol/L (ref 22–32)
CREATININE: 2.16 mg/dL — AB (ref 0.44–1.00)
Calcium: 6.1 mg/dL — CL (ref 8.9–10.3)
GFR, EST AFRICAN AMERICAN: 33 mL/min — AB (ref >60)
GFR, EST NON AFRICAN AMERICAN: 29 mL/min — AB (ref >60)
Glucose, Bld: 111 mg/dL — ABNORMAL HIGH (ref 70–99)
Potassium: 3.5 mmol/L (ref 3.5–5.1)
Sodium: 143 mmol/L (ref 135–145)
TOTAL PROTEIN: 5.6 g/dL — AB (ref 6.5–8.1)

## 2017-10-25 LAB — GLUCOSE, CAPILLARY
GLUCOSE-CAPILLARY: 109 mg/dL — AB (ref 70–99)
GLUCOSE-CAPILLARY: 184 mg/dL — AB (ref 70–99)
GLUCOSE-CAPILLARY: 172 mg/dL — AB (ref 70–99)
Glucose-Capillary: 103 mg/dL — ABNORMAL HIGH (ref 70–99)
Glucose-Capillary: 124 mg/dL — ABNORMAL HIGH (ref 70–99)

## 2017-10-25 LAB — URINE CULTURE

## 2017-10-25 MED ORDER — INSULIN GLARGINE 100 UNIT/ML ~~LOC~~ SOLN
20.0000 [IU] | Freq: Every day | SUBCUTANEOUS | Status: DC
  Administered 2017-10-25 – 2017-10-26 (×2): 20 [IU] via SUBCUTANEOUS
  Filled 2017-10-25 (×2): qty 0.2

## 2017-10-25 MED ORDER — TRAMADOL HCL 50 MG PO TABS
50.0000 mg | ORAL_TABLET | Freq: Four times a day (QID) | ORAL | Status: DC | PRN
  Administered 2017-10-25 – 2017-10-26 (×4): 50 mg via ORAL
  Filled 2017-10-25 (×4): qty 1

## 2017-10-25 MED ORDER — CALCIUM CARBONATE ANTACID 500 MG PO CHEW
800.0000 mg | CHEWABLE_TABLET | Freq: Three times a day (TID) | ORAL | Status: DC
  Administered 2017-10-25 (×2): 800 mg via ORAL
  Filled 2017-10-25 (×3): qty 4

## 2017-10-25 MED ORDER — GABAPENTIN 100 MG PO CAPS
200.0000 mg | ORAL_CAPSULE | Freq: Every day | ORAL | Status: DC | PRN
  Administered 2017-10-25: 200 mg via ORAL
  Filled 2017-10-25: qty 2

## 2017-10-25 MED ORDER — METOCLOPRAMIDE HCL 5 MG/ML IJ SOLN
5.0000 mg | Freq: Three times a day (TID) | INTRAMUSCULAR | Status: DC
  Administered 2017-10-25 – 2017-10-26 (×2): 5 mg via INTRAVENOUS
  Filled 2017-10-25 (×2): qty 2
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
## 2017-10-25 NOTE — Consult Note (Addendum)
   [facility] Inpatient Consult   10/25/2017  [name] 08/19/1985 [id number]   Made aware of hospitalization by [facility]. Patient has had multiple hospitalizations and numerous ED visits.  Went to bedside to speak with [name] about [facility] services. [name] declined. Denies having transportation, medication, or community case management needs at this time.   She confirms here [name] ( Not a [facility] Provider). However, she was recently a patient of [facility]. [name] currently appears to be going to [facility] in [place], [place]. She is s/p renal transplant.    [name], MSN-Ed, RN,BSN [facility] Liaison [phone]

## 2017-10-25 NOTE — Progress Notes (Signed)
CRITICAL VALUE ALERT  Critical Value: Calcium: 6.1  Date & Time Notied: 10/25/17 0825  Provider Notified: Sarajane Jews, MD  Orders Received/Actions taken: Orders placed and followed. Will continue to monitor.

## 2017-10-25 NOTE — Progress Notes (Signed)
Patient is refusing to take scheduled Reglan because she states that she is allergic to this medication. Patient stated that she can only take Zofran. MD notified and made aware of this information. Will continue to monitor.

## 2017-10-25 NOTE — Progress Notes (Signed)
Pt new admit last night complains of nausea and vomiting at home MD made pt NPO.Pt complain about being hungry and wanted something to eat,diet changed to clear liquid after pt ate the icee and jello pt complain of abdominal pain and nausea pt received pain med and reglan after 30 minutes pt had no complaints and went to sleep

## 2017-10-26 LAB — GLUCOSE, CAPILLARY
GLUCOSE-CAPILLARY: 117 mg/dL — AB (ref 70–99)
GLUCOSE-CAPILLARY: 124 mg/dL — AB (ref 70–99)
Glucose-Capillary: 133 mg/dL — ABNORMAL HIGH (ref 70–99)
Glucose-Capillary: 115 mg/dL — ABNORMAL HIGH (ref 70–99)

## 2017-10-26 LAB — BASIC METABOLIC PANEL
Anion gap: 11 (ref 5–15)
BUN: 17 mg/dL (ref 6–20)
CHLORIDE: 106 mmol/L (ref 98–111)
CO2: 24 mmol/L (ref 22–32)
CREATININE: 1.66 mg/dL — AB (ref 0.44–1.00)
Calcium: 6.2 mg/dL — CL (ref 8.9–10.3)
GFR calc non Af Amer: 39 mL/min — ABNORMAL LOW (ref >60)
GFR, EST AFRICAN AMERICAN: 46 mL/min — AB (ref >60)
Glucose, Bld: 134 mg/dL — ABNORMAL HIGH (ref 70–99)
Potassium: 3 mmol/L — ABNORMAL LOW (ref 3.5–5.1)
Sodium: 141 mmol/L (ref 135–145)

## 2017-10-26 MED ORDER — ONDANSETRON 4 MG PO TBDP: 4.0000 mg | ORAL_TABLET | ORAL | 2 refills | Status: DC | PRN

## 2017-10-26 MED ORDER — CALCIUM CARBONATE ANTACID 500 MG PO CHEW: 800.0000 mg | CHEWABLE_TABLET | Freq: Every day | ORAL | Status: DC

## 2017-10-26 MED ORDER — POTASSIUM CHLORIDE CRYS ER 20 MEQ PO TBCR
40.0000 meq | EXTENDED_RELEASE_TABLET | Freq: Once | ORAL | Status: DC
  Filled 2017-10-26: qty 2

## 2017-10-26 MED ORDER — CLONIDINE 0.1 MG/24HR TD PTWK: 0.1000 mg | MEDICATED_PATCH | TRANSDERMAL | 12 refills | Status: DC

## 2017-10-26 MED ORDER — POTASSIUM CHLORIDE 20 MEQ/15ML (10%) PO SOLN
40.0000 meq | Freq: Once | ORAL | Status: AC
  Administered 2017-10-26: 40 meq via ORAL
  Filled 2017-10-26: qty 30

## 2017-10-26 MED ORDER — PROCHLORPERAZINE 25 MG RE SUPP
25.0000 mg | Freq: Two times a day (BID) | RECTAL | Status: DC | PRN
  Administered 2017-10-26: 25 mg via RECTAL
  Filled 2017-10-26 (×2): qty 1

## 2017-10-26 MED ORDER — PROCHLORPERAZINE 25 MG RE SUPP: 25.0000 mg | Freq: Two times a day (BID) | RECTAL | 0 refills | Status: DC | PRN

## 2017-10-26 MED ORDER — OXYCODONE HCL 5 MG PO TABS
5.0000 mg | ORAL_TABLET | Freq: Once | ORAL | Status: AC
  Administered 2017-10-26: 5 mg via ORAL
  Filled 2017-10-26: qty 1

## 2017-10-26 MED ORDER — CLONIDINE HCL 0.1 MG/24HR TD PTWK
0.1000 mg | MEDICATED_PATCH | TRANSDERMAL | Status: DC
  Administered 2017-10-26: 0.1 mg via TRANSDERMAL
  Filled 2017-10-26: qty 1

## 2017-10-26 MED ORDER — METOCLOPRAMIDE HCL 10 MG PO TABS: 10.0000 mg | ORAL_TABLET | Freq: Three times a day (TID) | ORAL | 0 refills | Status: DC | PRN

## 2017-10-26 MED ORDER — POTASSIUM CHLORIDE 20 MEQ PO PACK: 40.0000 meq | PACK | Freq: Every day | ORAL | 0 refills | Status: DC

## 2017-10-26 MED ORDER — CALCIUM CARBONATE-VITAMIN D 500-200 MG-UNIT PO TABS
2.0000 | ORAL_TABLET | Freq: Two times a day (BID) | ORAL | Status: DC
  Filled 2017-10-26: qty 2

## 2017-10-26 MED ORDER — INSULIN DEGLUDEC 100 UNIT/ML ~~LOC~~ SOPN: 20.0000 [IU] | PEN_INJECTOR | Freq: Every day | SUBCUTANEOUS | 0 refills | Status: DC

## 2017-10-26 MED ORDER — HYDRALAZINE HCL 20 MG/ML IJ SOLN
10.0000 mg | Freq: Once | INTRAMUSCULAR | Status: AC
  Administered 2017-10-26: 10 mg via INTRAVENOUS
  Filled 2017-10-26: qty 1

## 2017-10-26 NOTE — Progress Notes (Signed)
Patient has been complaining of nausea and has been unable to take medication. Tried swallowing whole pills and crush/dissolved and she continues to spit out all of her medication.

## 2017-10-26 NOTE — Progress Notes (Signed)
BP is 159/102, MD aware

## 2017-10-26 NOTE — Progress Notes (Signed)
CRITICAL VALUE ALERT  Critical Value:  Ca 6.2  Date & Time Notied:  8/14 7:45am  Provider Notified: Emokpae  Orders Received/Actions taken: Patient has calcium carbonate ordered, however she refuses due to current n/v

## 2017-10-26 NOTE — Progress Notes (Signed)
Patients discharge instructions given to patient, as well as doctors note and prescriptions. Patient verbalized understanding. RN removed patients IV. Skin intact. Patient voices no complaints. Patients brother, Bishop Limbo, at [place] parking to pick up patient. Patient escorted by NT, Jenny Reichmann, with wheelchair.
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
## 2017-10-26 NOTE — Discharge Instructions (Signed)
1)Keep your appointment on Thursday, 10/27/2017 with your liver transplant team at [facility] in [place] 2)Please ask your kidney transplant team if it is okay for you to use erythromycin base for gastrointestinal prokinetic to help her gastroparesis (possibiity of interactions with antirejection medication needs to be checked) 3) May use Compazine suppositories if nausea persist after using oral tablets of Zofran 4)Please change your clonidine patch every Wednesday to help you get good blood pressure control,  5)You are excused from work until Monday, 10/31/2017 6)Pain medications/narcotics/opiates will further slow down your bowel and make your gastroparesis worse--- Which will make your Nausea and Vomiting Worse 7)Please take your Medications as prescribed

## 2017-10-27 ENCOUNTER — Encounter (HOSPITAL_COMMUNITY): Payer: Medicare Other

## 2017-10-27 DIAGNOSIS — E869 Volume depletion, unspecified: Secondary | ICD-10-CM | POA: Diagnosis present

## 2017-10-27 DIAGNOSIS — G4700 Insomnia, unspecified: Secondary | ICD-10-CM | POA: Diagnosis present

## 2017-10-27 DIAGNOSIS — F419 Anxiety disorder, unspecified: Secondary | ICD-10-CM | POA: Diagnosis present

## 2017-10-27 DIAGNOSIS — Z940 Kidney transplant status: Secondary | ICD-10-CM | POA: Diagnosis not present

## 2017-10-27 DIAGNOSIS — Z91410 Personal history of adult physical and sexual abuse: Secondary | ICD-10-CM | POA: Diagnosis not present

## 2017-10-27 DIAGNOSIS — R112 Nausea with vomiting, unspecified: Secondary | ICD-10-CM | POA: Diagnosis not present

## 2017-10-27 DIAGNOSIS — E785 Hyperlipidemia, unspecified: Secondary | ICD-10-CM | POA: Diagnosis present

## 2017-10-27 DIAGNOSIS — E875 Hyperkalemia: Secondary | ICD-10-CM | POA: Diagnosis present

## 2017-10-27 DIAGNOSIS — D631 Anemia in chronic kidney disease: Secondary | ICD-10-CM | POA: Diagnosis present

## 2017-10-27 DIAGNOSIS — N250 Renal osteodystrophy: Secondary | ICD-10-CM | POA: Diagnosis present

## 2017-10-27 DIAGNOSIS — Z0489 Encounter for examination and observation for other specified reasons: Secondary | ICD-10-CM | POA: Diagnosis not present

## 2017-10-27 DIAGNOSIS — B370 Candidal stomatitis: Secondary | ICD-10-CM | POA: Diagnosis present

## 2017-10-27 DIAGNOSIS — K30 Functional dyspepsia: Secondary | ICD-10-CM | POA: Diagnosis present

## 2017-10-27 DIAGNOSIS — E1122 Type 2 diabetes mellitus with diabetic chronic kidney disease: Secondary | ICD-10-CM | POA: Diagnosis present

## 2017-10-27 DIAGNOSIS — E1165 Type 2 diabetes mellitus with hyperglycemia: Secondary | ICD-10-CM | POA: Diagnosis present

## 2017-10-27 DIAGNOSIS — Q970 Karyotype 47, XXX: Secondary | ICD-10-CM | POA: Diagnosis not present

## 2017-10-27 DIAGNOSIS — N179 Acute kidney failure, unspecified: Secondary | ICD-10-CM | POA: Diagnosis not present

## 2017-10-27 DIAGNOSIS — Z79899 Other long term (current) drug therapy: Secondary | ICD-10-CM | POA: Diagnosis not present

## 2017-11-02 DIAGNOSIS — R112 Nausea with vomiting, unspecified: Secondary | ICD-10-CM | POA: Diagnosis not present

## 2017-11-02 DIAGNOSIS — Z794 Long term (current) use of insulin: Secondary | ICD-10-CM | POA: Diagnosis not present

## 2017-11-02 DIAGNOSIS — R000 Tachycardia, unspecified: Secondary | ICD-10-CM | POA: Diagnosis not present

## 2017-11-02 DIAGNOSIS — Z940 Kidney transplant status: Secondary | ICD-10-CM | POA: Diagnosis not present

## 2017-11-02 DIAGNOSIS — R1031 Right lower quadrant pain: Secondary | ICD-10-CM | POA: Diagnosis not present

## 2017-11-02 DIAGNOSIS — N186 End stage renal disease: Secondary | ICD-10-CM | POA: Diagnosis not present

## 2017-11-02 DIAGNOSIS — T8611 Kidney transplant rejection: Secondary | ICD-10-CM | POA: Diagnosis not present

## 2017-11-02 DIAGNOSIS — E1165 Type 2 diabetes mellitus with hyperglycemia: Secondary | ICD-10-CM | POA: Diagnosis not present

## 2017-11-02 DIAGNOSIS — R197 Diarrhea, unspecified: Secondary | ICD-10-CM | POA: Diagnosis not present

## 2017-11-02 DIAGNOSIS — Z9049 Acquired absence of other specified parts of digestive tract: Secondary | ICD-10-CM | POA: Diagnosis not present

## 2017-11-02 DIAGNOSIS — E1122 Type 2 diabetes mellitus with diabetic chronic kidney disease: Secondary | ICD-10-CM | POA: Diagnosis not present

## 2017-11-02 DIAGNOSIS — R109 Unspecified abdominal pain: Secondary | ICD-10-CM | POA: Diagnosis not present

## 2017-11-10 ENCOUNTER — Encounter (HOSPITAL_COMMUNITY): Payer: Medicare Other

## 2017-11-11 ENCOUNTER — Other Ambulatory Visit: Payer: Self-pay

## 2017-11-11 ENCOUNTER — Emergency Department (HOSPITAL_COMMUNITY): Payer: Medicare Other

## 2017-11-11 ENCOUNTER — Encounter (HOSPITAL_COMMUNITY): Payer: Self-pay | Admitting: Internal Medicine

## 2017-11-11 ENCOUNTER — Encounter (HOSPITAL_COMMUNITY)
Admission: RE | Admit: 2017-11-11 | Discharge: 2017-11-11 | Disposition: A | Discharge status: Home / Self Care | Payer: Medicare Other | Source: Ambulatory Visit | Attending: Nephrology | Admitting: Nephrology

## 2017-11-11 ENCOUNTER — Inpatient Hospital Stay (HOSPITAL_COMMUNITY)
Admission: EM | Admit: 2017-11-11 | Discharge: 2017-11-14 | DRG: 683 | Disposition: A | Discharge status: Home / Self Care | Payer: Medicare Other | Attending: Internal Medicine | Admitting: Internal Medicine

## 2017-11-11 ENCOUNTER — Other Ambulatory Visit (HOSPITAL_COMMUNITY): Payer: Self-pay

## 2017-11-11 VITALS — BP 94/62 | HR 132 | Temp 98.7°F | Ht 66.0 in | Wt 170.0 lb

## 2017-11-11 DIAGNOSIS — IMO0002 Reserved for concepts with insufficient information to code with codable children: Secondary | ICD-10-CM | POA: Diagnosis present

## 2017-11-11 DIAGNOSIS — Z5181 Encounter for therapeutic drug level monitoring: Secondary | ICD-10-CM | POA: Insufficient documentation

## 2017-11-11 DIAGNOSIS — Z888 Allergy status to other drugs, medicaments and biological substances status: Secondary | ICD-10-CM

## 2017-11-11 DIAGNOSIS — E109 Type 1 diabetes mellitus without complications: Secondary | ICD-10-CM | POA: Diagnosis present

## 2017-11-11 DIAGNOSIS — Z833 Family history of diabetes mellitus: Secondary | ICD-10-CM

## 2017-11-11 DIAGNOSIS — I10 Essential (primary) hypertension: Secondary | ICD-10-CM | POA: Diagnosis present

## 2017-11-11 DIAGNOSIS — G43909 Migraine, unspecified, not intractable, without status migrainosus: Secondary | ICD-10-CM | POA: Diagnosis not present

## 2017-11-11 DIAGNOSIS — Z940 Kidney transplant status: Secondary | ICD-10-CM | POA: Diagnosis not present

## 2017-11-11 DIAGNOSIS — I959 Hypotension, unspecified: Secondary | ICD-10-CM | POA: Diagnosis not present

## 2017-11-11 DIAGNOSIS — N2581 Secondary hyperparathyroidism of renal origin: Secondary | ICD-10-CM | POA: Diagnosis not present

## 2017-11-11 DIAGNOSIS — D631 Anemia in chronic kidney disease: Secondary | ICD-10-CM | POA: Diagnosis present

## 2017-11-11 DIAGNOSIS — K3184 Gastroparesis: Secondary | ICD-10-CM | POA: Diagnosis not present

## 2017-11-11 DIAGNOSIS — N183 Chronic kidney disease, stage 3 unspecified: Secondary | ICD-10-CM | POA: Diagnosis present

## 2017-11-11 DIAGNOSIS — N179 Acute kidney failure, unspecified: Secondary | ICD-10-CM | POA: Diagnosis not present

## 2017-11-11 DIAGNOSIS — Z794 Long term (current) use of insulin: Secondary | ICD-10-CM

## 2017-11-11 DIAGNOSIS — E1143 Type 2 diabetes mellitus with diabetic autonomic (poly)neuropathy: Secondary | ICD-10-CM

## 2017-11-11 DIAGNOSIS — Z79899 Other long term (current) drug therapy: Secondary | ICD-10-CM | POA: Insufficient documentation

## 2017-11-11 DIAGNOSIS — R42 Dizziness and giddiness: Secondary | ICD-10-CM | POA: Diagnosis not present

## 2017-11-11 DIAGNOSIS — E1165 Type 2 diabetes mellitus with hyperglycemia: Secondary | ICD-10-CM | POA: Diagnosis not present

## 2017-11-11 DIAGNOSIS — R000 Tachycardia, unspecified: Secondary | ICD-10-CM | POA: Diagnosis not present

## 2017-11-11 DIAGNOSIS — E108 Type 1 diabetes mellitus with unspecified complications: Secondary | ICD-10-CM

## 2017-11-11 DIAGNOSIS — R1115 Cyclical vomiting syndrome unrelated to migraine: Secondary | ICD-10-CM

## 2017-11-11 DIAGNOSIS — E1065 Type 1 diabetes mellitus with hyperglycemia: Secondary | ICD-10-CM | POA: Diagnosis not present

## 2017-11-11 DIAGNOSIS — Z886 Allergy status to analgesic agent status: Secondary | ICD-10-CM

## 2017-11-11 DIAGNOSIS — I129 Hypertensive chronic kidney disease with stage 1 through stage 4 chronic kidney disease, or unspecified chronic kidney disease: Secondary | ICD-10-CM | POA: Diagnosis present

## 2017-11-11 DIAGNOSIS — N170 Acute kidney failure with tubular necrosis: Principal | ICD-10-CM | POA: Diagnosis present

## 2017-11-11 DIAGNOSIS — G8929 Other chronic pain: Secondary | ICD-10-CM | POA: Diagnosis not present

## 2017-11-11 DIAGNOSIS — E1043 Type 1 diabetes mellitus with diabetic autonomic (poly)neuropathy: Secondary | ICD-10-CM | POA: Diagnosis present

## 2017-11-11 DIAGNOSIS — N189 Chronic kidney disease, unspecified: Secondary | ICD-10-CM | POA: Diagnosis present

## 2017-11-11 DIAGNOSIS — Z9049 Acquired absence of other specified parts of digestive tract: Secondary | ICD-10-CM

## 2017-11-11 DIAGNOSIS — R112 Nausea with vomiting, unspecified: Secondary | ICD-10-CM | POA: Diagnosis not present

## 2017-11-11 DIAGNOSIS — Z7951 Long term (current) use of inhaled steroids: Secondary | ICD-10-CM

## 2017-11-11 DIAGNOSIS — Z8249 Family history of ischemic heart disease and other diseases of the circulatory system: Secondary | ICD-10-CM

## 2017-11-11 DIAGNOSIS — N1330 Unspecified hydronephrosis: Secondary | ICD-10-CM | POA: Diagnosis present

## 2017-11-11 DIAGNOSIS — Z91013 Allergy to seafood: Secondary | ICD-10-CM

## 2017-11-11 DIAGNOSIS — Z7952 Long term (current) use of systemic steroids: Secondary | ICD-10-CM

## 2017-11-11 DIAGNOSIS — Z8261 Family history of arthritis: Secondary | ICD-10-CM

## 2017-11-11 DIAGNOSIS — G43A0 Cyclical vomiting, not intractable: Secondary | ICD-10-CM | POA: Diagnosis not present

## 2017-11-11 DIAGNOSIS — Z881 Allergy status to other antibiotic agents status: Secondary | ICD-10-CM

## 2017-11-11 DIAGNOSIS — D72829 Elevated white blood cell count, unspecified: Secondary | ICD-10-CM | POA: Diagnosis present

## 2017-11-11 DIAGNOSIS — Z91018 Allergy to other foods: Secondary | ICD-10-CM

## 2017-11-11 DIAGNOSIS — G4489 Other headache syndrome: Secondary | ICD-10-CM | POA: Diagnosis not present

## 2017-11-11 DIAGNOSIS — N132 Hydronephrosis with renal and ureteral calculous obstruction: Secondary | ICD-10-CM | POA: Diagnosis not present

## 2017-11-11 DIAGNOSIS — R110 Nausea: Secondary | ICD-10-CM | POA: Diagnosis not present

## 2017-11-11 DIAGNOSIS — Z7984 Long term (current) use of oral hypoglycemic drugs: Secondary | ICD-10-CM

## 2017-11-11 DIAGNOSIS — R739 Hyperglycemia, unspecified: Secondary | ICD-10-CM

## 2017-11-11 DIAGNOSIS — E785 Hyperlipidemia, unspecified: Secondary | ICD-10-CM | POA: Diagnosis present

## 2017-11-11 DIAGNOSIS — F819 Developmental disorder of scholastic skills, unspecified: Secondary | ICD-10-CM | POA: Diagnosis present

## 2017-11-11 DIAGNOSIS — E1022 Type 1 diabetes mellitus with diabetic chronic kidney disease: Secondary | ICD-10-CM | POA: Diagnosis present

## 2017-11-11 DIAGNOSIS — N182 Chronic kidney disease, stage 2 (mild): Secondary | ICD-10-CM

## 2017-11-11 LAB — CBC WITH DIFFERENTIAL/PLATELET
ABS IMMATURE GRANULOCYTES: 0.1 10*3/uL (ref 0.0–0.1)
BASOS PCT: 1 %
Basophils Absolute: 0.1 10*3/uL (ref 0.0–0.1)
Eosinophils Absolute: 0 10*3/uL (ref 0.0–0.7)
Eosinophils Relative: 0 %
HEMATOCRIT: 28.9 % — AB (ref 36.0–46.0)
Hemoglobin: 8.6 g/dL — ABNORMAL LOW (ref 12.0–15.0)
IMMATURE GRANULOCYTES: 1 %
LYMPHS ABS: 0.7 10*3/uL (ref 0.7–4.0)
Lymphocytes Relative: 4 %
MCH: 28.3 pg (ref 26.0–34.0)
MCHC: 29.8 g/dL — ABNORMAL LOW (ref 30.0–36.0)
MCV: 95.1 fL (ref 78.0–100.0)
MONO ABS: 0.9 10*3/uL (ref 0.1–1.0)
MONOS PCT: 5 %
NEUTROS ABS: 17.2 10*3/uL — AB (ref 1.7–7.7)
NEUTROS PCT: 91 %
PLATELETS: 199 10*3/uL (ref 150–400)
RBC: 3.04 MIL/uL — ABNORMAL LOW (ref 3.87–5.11)
RDW: 13.9 % (ref 11.5–15.5)
WBC: 18.9 10*3/uL — ABNORMAL HIGH (ref 4.0–10.5)

## 2017-11-11 LAB — RAPID URINE DRUG SCREEN, HOSP PERFORMED
Amphetamines: NOT DETECTED
BARBITURATES: NOT DETECTED
Benzodiazepines: NOT DETECTED
COCAINE: NOT DETECTED
OPIATES: POSITIVE — AB
TETRAHYDROCANNABINOL: NOT DETECTED

## 2017-11-11 LAB — LIPASE, BLOOD: Lipase: 42 U/L (ref 11–51)

## 2017-11-11 LAB — COMPREHENSIVE METABOLIC PANEL
ALBUMIN: 3 g/dL — AB (ref 3.5–5.0)
ALK PHOS: 46 U/L (ref 38–126)
ALT: 14 U/L (ref 0–44)
AST: 12 U/L — AB (ref 15–41)
Anion gap: 12 (ref 5–15)
BILIRUBIN TOTAL: 0.7 mg/dL (ref 0.3–1.2)
BUN: 50 mg/dL — AB (ref 6–20)
CO2: 15 mmol/L — AB (ref 22–32)
Calcium: 6.5 mg/dL — ABNORMAL LOW (ref 8.9–10.3)
Chloride: 109 mmol/L (ref 98–111)
Creatinine, Ser: 3.21 mg/dL — ABNORMAL HIGH (ref 0.44–1.00)
GFR calc Af Amer: 21 mL/min — ABNORMAL LOW (ref >60)
GFR calc non Af Amer: 18 mL/min — ABNORMAL LOW (ref >60)
GLUCOSE: 461 mg/dL — AB (ref 70–99)
POTASSIUM: 4.2 mmol/L (ref 3.5–5.1)
SODIUM: 136 mmol/L (ref 135–145)
TOTAL PROTEIN: 6.4 g/dL — AB (ref 6.5–8.1)

## 2017-11-11 LAB — IRON AND TIBC
Iron: 29 ug/dL (ref 28–170)
Saturation Ratios: 17 % (ref 10.4–31.8)
TIBC: 175 ug/dL — ABNORMAL LOW (ref 250–450)
UIBC: 146 ug/dL

## 2017-11-11 LAB — GLUCOSE, CAPILLARY
Glucose-Capillary: 232 mg/dL — ABNORMAL HIGH (ref 70–99)
Glucose-Capillary: 264 mg/dL — ABNORMAL HIGH (ref 70–99)

## 2017-11-11 LAB — CBG MONITORING, ED: GLUCOSE-CAPILLARY: 353 mg/dL — AB (ref 70–99)

## 2017-11-11 LAB — FERRITIN: Ferritin: 895 ng/mL — ABNORMAL HIGH (ref 11–307)

## 2017-11-11 LAB — HCG, QUANTITATIVE, PREGNANCY: hCG, Beta Chain, Quant, S: 6 m[IU]/mL — ABNORMAL HIGH (ref <5)

## 2017-11-11 LAB — POCT HEMOGLOBIN-HEMACUE: Hemoglobin: 9.5 g/dL — ABNORMAL LOW (ref 12.0–15.0)

## 2017-11-11 MED ORDER — METOCLOPRAMIDE HCL 5 MG/ML IJ SOLN
5.0000 mg | Freq: Three times a day (TID) | INTRAMUSCULAR | Status: AC
  Administered 2017-11-12: 5 mg via INTRAVENOUS
  Filled 2017-11-11: qty 2

## 2017-11-11 MED ORDER — MORPHINE SULFATE (PF) 4 MG/ML IV SOLN
4.0000 mg | Freq: Once | INTRAVENOUS | Status: AC
  Administered 2017-11-11: 4 mg via INTRAVENOUS
  Filled 2017-11-11: qty 1

## 2017-11-11 MED ORDER — EPOETIN ALFA 20000 UNIT/ML IJ SOLN
INTRAMUSCULAR | Status: AC
  Administered 2017-11-11: 20000 [IU]
  Filled 2017-11-11: qty 1

## 2017-11-11 MED ORDER — INSULIN GLARGINE 100 UNIT/ML ~~LOC~~ SOLN
10.0000 [IU] | Freq: Every day | SUBCUTANEOUS | Status: DC
  Administered 2017-11-12: 10 [IU] via SUBCUTANEOUS
  Filled 2017-11-11: qty 0.1

## 2017-11-11 MED ORDER — HYDROXYZINE HCL 25 MG PO TABS: 50.0000 mg | ORAL_TABLET | Freq: Two times a day (BID) | ORAL | Status: DC | PRN

## 2017-11-11 MED ORDER — GABAPENTIN 100 MG PO CAPS: 200.0000 mg | ORAL_CAPSULE | Freq: Every day | ORAL | Status: DC | PRN

## 2017-11-11 MED ORDER — MYCOPHENOLATE SODIUM 180 MG PO TBEC
720.0000 mg | DELAYED_RELEASE_TABLET | Freq: Two times a day (BID) | ORAL | Status: DC
  Administered 2017-11-11 – 2017-11-14 (×6): 720 mg via ORAL
  Filled 2017-11-11 (×7): qty 4

## 2017-11-11 MED ORDER — TACROLIMUS 1 MG PO CAPS
3.0000 mg | ORAL_CAPSULE | Freq: Two times a day (BID) | ORAL | Status: DC
  Administered 2017-11-11 – 2017-11-14 (×6): 3 mg via ORAL
  Filled 2017-11-11 (×6): qty 3

## 2017-11-11 MED ORDER — PROMETHAZINE HCL 25 MG/ML IJ SOLN: 12.5000 mg | Freq: Four times a day (QID) | INTRAMUSCULAR | Status: DC | PRN

## 2017-11-11 MED ORDER — ACETAMINOPHEN 650 MG RE SUPP: 650.0000 mg | Freq: Four times a day (QID) | RECTAL | Status: DC | PRN

## 2017-11-11 MED ORDER — ACETAMINOPHEN 325 MG PO TABS: 650.0000 mg | ORAL_TABLET | Freq: Four times a day (QID) | ORAL | Status: DC | PRN

## 2017-11-11 MED ORDER — EPOETIN ALFA 40000 UNIT/ML IJ SOLN: 30000.0000 [IU] | INTRAMUSCULAR | Status: DC

## 2017-11-11 MED ORDER — METOCLOPRAMIDE HCL 5 MG/ML IJ SOLN
5.0000 mg | Freq: Three times a day (TID) | INTRAMUSCULAR | Status: DC
  Administered 2017-11-11: 5 mg via INTRAVENOUS
  Filled 2017-11-11: qty 2

## 2017-11-11 MED ORDER — PREDNISONE 10 MG PO TABS
10.0000 mg | ORAL_TABLET | Freq: Two times a day (BID) | ORAL | Status: DC
  Administered 2017-11-12: 10 mg via ORAL
  Filled 2017-11-11: qty 1

## 2017-11-11 MED ORDER — FAMOTIDINE 20 MG PO TABS
20.0000 mg | ORAL_TABLET | Freq: Two times a day (BID) | ORAL | Status: DC
  Administered 2017-11-12 (×2): 20 mg via ORAL
  Filled 2017-11-11 (×2): qty 1

## 2017-11-11 MED ORDER — SODIUM CHLORIDE 0.9 % IV SOLN
INTRAVENOUS | Status: AC
  Administered 2017-11-12 (×2): via INTRAVENOUS

## 2017-11-11 MED ORDER — SENNOSIDES-DOCUSATE SODIUM 8.6-50 MG PO TABS: 1.0000 | ORAL_TABLET | Freq: Every evening | ORAL | Status: DC | PRN

## 2017-11-11 MED ORDER — SODIUM CHLORIDE 0.9 % IV SOLN
INTRAVENOUS | Status: DC
  Administered 2017-11-11: 23:00:00 via INTRAVENOUS

## 2017-11-11 MED ORDER — EPOETIN ALFA 10000 UNIT/ML IJ SOLN
INTRAMUSCULAR | Status: AC
  Administered 2017-11-11: 10000 [IU]
  Filled 2017-11-11: qty 1

## 2017-11-11 MED ORDER — SODIUM CHLORIDE 0.9 % IV BOLUS
500.0000 mL | Freq: Once | INTRAVENOUS | Status: AC
  Administered 2017-11-11: 500 mL via INTRAVENOUS

## 2017-11-11 MED ORDER — OXYCODONE-ACETAMINOPHEN 5-325 MG PO TABS
1.0000 | ORAL_TABLET | ORAL | Status: DC | PRN
  Administered 2017-11-11 – 2017-11-14 (×4): 2 via ORAL
  Filled 2017-11-11 (×5): qty 2

## 2017-11-11 MED ORDER — INSULIN ASPART 100 UNIT/ML ~~LOC~~ SOLN
0.0000 [IU] | SUBCUTANEOUS | Status: DC
  Administered 2017-11-12: 7 [IU] via SUBCUTANEOUS
  Administered 2017-11-12: 2 [IU] via SUBCUTANEOUS
  Administered 2017-11-12: 3 [IU] via SUBCUTANEOUS

## 2017-11-11 MED ORDER — INSULIN ASPART 100 UNIT/ML ~~LOC~~ SOLN
5.0000 [IU] | Freq: Once | SUBCUTANEOUS | Status: AC
  Administered 2017-11-11: 5 [IU] via SUBCUTANEOUS
  Filled 2017-11-11: qty 1

## 2017-11-11 MED ORDER — ONDANSETRON HCL 4 MG/2ML IJ SOLN
4.0000 mg | Freq: Once | INTRAMUSCULAR | Status: AC
  Administered 2017-11-11: 4 mg via INTRAVENOUS
  Filled 2017-11-11: qty 2

## 2017-11-11 MED ORDER — ONDANSETRON HCL 4 MG/2ML IJ SOLN: 4.0000 mg | Freq: Four times a day (QID) | INTRAMUSCULAR | Status: DC | PRN

## 2017-11-11 MED ORDER — SIMVASTATIN 20 MG PO TABS
20.0000 mg | ORAL_TABLET | ORAL | Status: DC
  Administered 2017-11-14: 20 mg via ORAL
  Filled 2017-11-11: qty 1

## 2017-11-11 NOTE — ED Notes (Signed)
Pt stated she was unable to provide urine sample at this time.

## 2017-11-11 NOTE — ED Provider Notes (Signed)
[facility] EMERGENCY DEPARTMENT Provider Note   CSN: [id number] Arrival date & time: 11/11/17  1359     History   Chief Complaint Chief Complaint  Patient presents with  . Migraine    HPI [name] is a 32 y.o. female with a past medical history of type 1 diabetes, status post right kidney transplant done in 2007, who presents to ED for evaluation of multiple complaints.  She reports headache that began today.  She believes this is from the 6 episodes of nonbloody, nonbilious emesis she has had since this morning.  She reports generalized abdominal pain and nausea as well.  Reports feeling bad last night with caused her denied any dinner.  She has not taken any of her home medications including Zofran or "kidney medicine" today secondary to nausea and vomiting.  She has been seen and evaluated several times this month for similar symptoms.  She denies any urinary symptoms, changes in her bowel movements, vaginal complaints, sick contacts.  Patient had transplant done in [place]. Patient reports dizziness this morning and found to be anemic. She was given a Procrit shot. EMS states orthostatics + on exam, improvement in BP with 200cc bolus given.  HPI  Past Medical History:  Diagnosis Date  . Chronic kidney disease   . Diabetes mellitus    Pt reports diagnosis in June 2011  . Hyperlipidemia   . Hypertension   . Kidney transplant recipient   . 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 Pyelonephritis 06/23/2014  . UTI (urinary tract infection) 01/09/2015  . 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 AKI (acute kidney injury) ([place]) 07/13/2017  . Constipation 07/13/2017  . Hematemesis 07/02/2017  . Chronic cholecystitis 06/29/2017  . Type 1 diabetes mellitus with complication, uncontrolled ([place])  05/25/2015  . Intractable nausea and vomiting 01/09/2015  . Renal transplant recipient   . Immunosuppressed status ([place])   . 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 CHRONIC KIDNEY DISEASE STAGE II (MILD) 01/04/2010  . OVARIAN FAILURE, PREMATURE 03/11/2009  . 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 SECONDARY HYPERPARATHYROIDISM 12/05/2007  . OBESITY 09/25/2007  . Anemia in chronic renal disease 09/25/2007  . LEARNING DISABILITY 09/25/2007  . Essential hypertension, benign 09/25/2007  . KIDNEY TRANSPLANTATION, HX OF 09/25/2007    Past Surgical History:  Procedure Laterality Date  . ARTERIOVENOUS GRAFT PLACEMENT Bilateral    "neither work" (10/24/2017)  . CHOLECYSTECTOMY N/A 06/30/2017   Procedure: LAPAROSCOPIC CHOLECYSTECTOMY WITH INTRAOPERATIVE CHOLANGIOGRAM;  Surgeon: Excell Seltzer, MD;  Location: [facility];  Service: General;  Laterality: N/A;  . ESOPHAGOGASTRODUODENOSCOPY (EGD) WITH PROPOFOL N/A 07/04/2017   Procedure: ESOPHAGOGASTRODUODENOSCOPY (EGD) WITH PROPOFOL;  Surgeon: Clarene Essex, MD;  Location: [facility];  Service: Endoscopy;  Laterality: N/A;  . KIDNEY TRANSPLANT  2007  . PARATHYROIDECTOMY  ?2012   "3/4 removed" (10/24/2017)  . RENAL BIOPSY Bilateral 2003     OB History   None      Home Medications    Prior to Admission medications   Medication Sig Start Date End Date Taking? Authorizing Provider  ACCU-CHEK SOFTCLIX LANCETS lancets Use to check blood sugar 4 times per day. 12/29/15   Renato Shin, MD  acetaminophen (TYLENOL) 500 MG tablet Take 1,000 mg by mouth daily as needed for  moderate pain.     [provider]  calcium elemental as carbonate (BARIATRIC TUMS ULTRA) 400 MG chewable tablet Chew 1,000 mg by mouth 3 (three) times daily as needed for heartburn.     [provider]  cetirizine (ZYRTEC) 10 MG tablet Take 10 mg by mouth daily as needed for allergies.     [provider]  cloNIDine (CATAPRES - DOSED IN  MG/24 HR) 0.1 mg/24hr patch Place 1 patch (0.1 mg total) onto the skin once a week. Every Wednesday 11/02/17   Roxan Hockey, MD  dicyclomine (BENTYL) 20 MG tablet Take 1 tablet (20 mg total) by mouth 2 (two) times daily. 10/22/17   Caccavale, Sophia, PA-C  famotidine (PEPCID) 40 MG tablet Take 1 tablet (40 mg total) by mouth daily. Can take up to 2 x day (every 12 hrs) Patient taking differently: Take 40 mg by mouth daily.  07/15/17   Debbe Odea, MD  fluticasone (FLONASE) 50 MCG/ACT nasal spray Place 2 sprays into both nostrils daily. Patient taking differently: Place 2 sprays into both nostrils daily as needed for allergies.  07/20/15   Muthersbaugh, Jarrett Soho, PA-C  furosemide (LASIX) 20 MG tablet Take 20 mg by mouth daily as needed. Feet swelling    [provider]  gabapentin (NEURONTIN) 100 MG capsule Take 200 mg by mouth daily as needed (for feet).  11/12/16   [provider]  HYDROcodone-acetaminophen (NORCO/VICODIN) 5-325 MG tablet Take 1 tablet by mouth every 6 (six) hours as needed. Patient taking differently: Take 1 tablet by mouth every 6 (six) hours as needed for moderate pain.  10/21/17   Virgel Manifold, MD  hydrOXYzine (ATARAX/VISTARIL) 50 MG tablet Take 50 mg by mouth 2 (two) times daily as needed for itching.     [provider]  insulin aspart (NOVOLOG FLEXPEN) 100 UNIT/ML FlexPen Inject 15 Units into the skin 3 (three) times daily with meals. 81-150=10 units 51-200=11 units 201-250=12 units 251-300=13 units 301-350=14 units >351=15 u Per sliding scale    [provider]  insulin degludec (TRESIBA) 100 UNIT/ML SOPN FlexTouch Pen Inject 0.2 mLs (20 Units total) into the skin at bedtime. 10/26/17   Roxan Hockey, MD  loperamide (IMODIUM) 2 MG capsule Take 2 mg by mouth as needed for diarrhea or loose stools.     [provider]  metoCLOPramide (REGLAN) 10 MG tablet Take 1 tablet (10 mg total) by mouth every 8 (eight) hours as needed for  nausea. 10/26/17   Roxan Hockey, MD  mycophenolate (MYFORTIC) 360 MG TBEC Take 720 mg by mouth 2 (two) times daily.     [provider]  ondansetron (ZOFRAN ODT) 4 MG disintegrating tablet Take 1 tablet (4 mg total) by mouth every 4 (four) hours as needed for nausea or vomiting. 10/26/17   Roxan Hockey, MD  potassium chloride (KLOR-CON) 20 MEQ packet Take 40 mEq by mouth daily for 5 days. 10/26/17 10/31/17  Roxan Hockey, MD  predniSONE (DELTASONE) 5 MG tablet Take 1 tablet (5 mg total) by mouth at bedtime. Patient taking differently: Take 10 mg by mouth 2 (two) times daily.  07/15/17   Debbe Odea, MD  prochlorperazine (COMPAZINE) 25 MG suppository Place 1 suppository (25 mg total) rectally every 12 (twelve) hours as needed for nausea or vomiting. 10/26/17   Roxan Hockey, MD  promethazine (PHENERGAN) 25 MG tablet Take 1 tablet (25 mg total) by mouth every 6 (six) hours as needed for nausea or vomiting. 10/20/17   Dalia Heading, PA-C  simvastatin (ZOCOR) 20 MG tablet Take 20 mg by mouth every Monday, Wednesday, and Friday.     [provider]  tacrolimus (PROGRAF) 1 MG capsule Take 3 mg by mouth 2 (two) times daily.     [provider]    Family History Family History  Problem Relation Age of Onset  . Arthritis Mother   . Diabetes Mother   . Hypertension Mother     Social History Social History   Tobacco Use  . Smoking status: Never Smoker  . Smokeless tobacco: Never Used  Substance Use Topics  . Alcohol use: No  . Drug use: No     Allergies   Benadryl [diphenhydramine-zinc acetate]; Motrin [ibuprofen]; Banana; Diphenhydramine hcl; Doxycycline; Iron dextran; and Shellfish allergy   Review of Systems Review of Systems  Constitutional: Negative for appetite change, chills and fever.  HENT: Negative for ear pain, rhinorrhea, sneezing and sore throat.   Eyes: Negative for photophobia and visual disturbance.  Respiratory: Negative for  cough, chest tightness, shortness of breath and wheezing.   Cardiovascular: Negative for chest pain and palpitations.  Gastrointestinal: Positive for abdominal pain, nausea and vomiting. Negative for blood in stool, constipation and diarrhea.  Genitourinary: Negative for dysuria, hematuria and urgency.  Musculoskeletal: Negative for myalgias, neck pain and neck stiffness.  Skin: Negative for rash.  Neurological: Positive for headaches. Negative for dizziness, weakness and light-headedness.     Physical Exam Updated Vital Signs BP 114/86   Pulse (!) 111   Resp 20   Ht 5\' 6"  (1.676 m)   Wt 77.1 kg   SpO2 99%   BMI 27.44 kg/m   Physical Exam  Constitutional: She appears well-developed and well-nourished. No distress.  HENT:  Head: Normocephalic and atraumatic.  Nose: Nose normal.  Eyes: Pupils are equal, round, and reactive to light. Conjunctivae and EOM are normal. Right eye exhibits no discharge. Left eye exhibits no discharge. No scleral icterus.  Neck: Normal range of motion. Neck supple.  No meningismus.  Cardiovascular: Regular rhythm, normal heart sounds and intact distal pulses. Tachycardia present. Exam reveals no gallop and no friction rub.  No murmur heard. Pulmonary/Chest: Effort normal and breath sounds normal. No respiratory distress.  Abdominal: Soft. Bowel sounds are normal. She exhibits no distension. There is generalized tenderness. There is CVA tenderness (R sided). There is no guarding.  Musculoskeletal: Normal range of motion. She exhibits no edema.  Neurological: She is alert. She exhibits normal muscle tone. Coordination normal.  Skin: Skin is warm and dry. No rash noted.  Psychiatric: She has a normal mood and affect.  Nursing note and vitals reviewed.    ED Treatments / Results  Labs (all labs ordered are listed, but only abnormal results are displayed) Labs Reviewed  COMPREHENSIVE METABOLIC PANEL - Abnormal; Notable for the following components:       Result Value   CO2 15 (*)    Glucose, Bld 461 (*)    BUN 50 (*)    Creatinine, Ser 3.21 (*)    Calcium 6.5 (*)    Total Protein 6.4 (*)    Albumin 3.0 (*)    AST 12 (*)    GFR calc non Af Amer 18 (*)    GFR calc Af Amer 21 (*)    All other components within normal limits  CBC WITH DIFFERENTIAL/PLATELET - Abnormal; Notable for the following components:   WBC 18.9 (*)    RBC 3.04 (*)    Hemoglobin 8.6 (*)  HCT 28.9 (*)    MCHC 29.8 (*)    Neutro Abs 17.2 (*)    All other components within normal limits  LIPASE, BLOOD  HCG, QUANTITATIVE, PREGNANCY    EKG None  Radiology No results found.  Procedures Procedures (including critical care time)  Medications Ordered in ED Medications  sodium chloride 0.9 % bolus 500 mL (500 mLs Intravenous New Bag/Given 11/11/17 1537)  morphine 4 MG/ML injection 4 mg (4 mg Intravenous Given 11/11/17 1532)  ondansetron (ZOFRAN) injection 4 mg (4 mg Intravenous Given 11/11/17 1533)     Initial Impression / Assessment and Plan / ED Course  I have reviewed the triage vital signs and the nursing notes.  Pertinent labs & imaging results that were available during my care of the patient were reviewed by me and considered in my medical decision making (see chart for details).  Clinical Course as of Nov 12 1555  Fri Nov 11, 2017  1438 Oral temp 98.15F   [HK]  1553 Patient states she gets procrit shots q2wks.   [HK]  1553 Creatinine(!): 3.21 [HK]  1553 BUN(!): 50 [HK]    Clinical Course User Index [HK] Delia Heady, PA-C    32 year old female with past medical history of type 1 diabetes, status post right kidney transplant done in 2004, who presents to ED for evaluation of headache that began today.  She believes headache is from the 6 episodes of nonbloody, nonbilious emesis she has had since this morning.  She reports generalized abdominal pain.  She was unable to keep any of her medication down this morning secondary to nausea and  vomiting.  Patient has been seen and evaluated multiple times this month for similar symptoms.  She was admitted on 8/12 to 8/14 for possible rejection of her kidney transplant, AKI and possible gastroparesis.  She reports improvement in her symptoms after this visit but recurred this morning.  On exam patient initially tachycardic.  No deficits on neurological exam noted.  No meningismus.  She is afebrile.  She is vomiting on exam.  Generalized abdominal pain with right CVA tenderness noted.  No rebound or guarding noted. There are no headache characteristics that are lateralizing or concerning for increased ICP, infectious or vascular cause of her symptoms.   Creatinine elevated at 3.2, BUN elevated at 50.  This is significantly higher than prior readings.  Hemoglobin 8.6 which is about one-point lower than what it was this morning.  Due to leukocytosis of 19, will need to obtain CT of the abdomen and pelvis to rule out infectious cause of her symptoms.  I anticipate admission for AKI at the least. Care handed off to B. Rona Ravens, PA-C pending remainder of workup. Patient's pain controlled here, now resting comfortably.  Portions of this note were generated with [employer]. Dictation errors may occur despite best attempts at proofreading.   Final Clinical Impressions(s) / ED Diagnoses   Final diagnoses:  AKI (acute kidney injury) [facility])    ED Discharge Orders    None       Delia Heady, PA-C 11/11/17 1557    Tegeler, Gwenyth Allegra, MD 11/11/17 [phone]
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
## 2017-11-11 NOTE — ED Notes (Signed)
Patient ambulated to bathroom with assistance.

## 2017-11-11 NOTE — H&P (Addendum)
History and Physical    AVON MOLOCK HFW:[id number] DOB: 07-02-1985 DOA: 11/11/2017  PCP: [name], MD   Patient coming from: Home   Chief Complaint: Nausea, vomiting, abdominal pain  HPI: [name] is a 32 y.o. female with medical history significant for renal transplant recipient, insulin-dependent diabetes mellitus, chronic pain, gastroparesis, and 109 XXX with developmental delay, now presenting to the emergency department for evaluation of abdominal pain with nausea and vomiting.  Patient has history of chronic abdominal pain with frequent nausea and vomiting attributed to gastroparesis and was admitted for this earlier in the month.  She had improved during the hospitalization and continued to do well at home initially before developing recurrence in her symptoms over the past several days.  She reports ongoing nausea with recurrent nonbloody vomiting today and has been unable to take her medications due to this.  EMS was called out, she was found to have BP in the 80s/40s, and was treated with 200 cc of normal saline prior to arrival in the ED.  ED Course: Upon arrival to the ED, patient is found to be afebrile, saturating well on room air, tachycardic, and with BP 94/62.  EKG features a sinus tachycardia with rate 111.  CT the abdomen and pelvis reveals right renal transplant with slightly improved hydronephrosis and similar appearance and perinephric stranding, as well as stable esophageal wall thickening that is likely chronic.  Chemistry panel is concerning for creatinine 3.21, up from 1.66 earlier in the month.  CBC is notable for leukocytosis to 18,900 and a normocytic anemia with hemoglobin of 8.6.  Patient was given 500 cc normal saline, Zofran, morphine, and insulin in the ED.  Symptoms improved, blood pressure remained stable, and she will be observed for ongoing evaluation and management of acute kidney injury superimposed on CKD 3.   Review of Systems:  All other systems  reviewed and apart from HPI, are negative.  Past Medical History:  Diagnosis Date  . Chronic kidney disease   . Diabetes mellitus    Pt reports diagnosis in June 2011  . Hyperlipidemia   . Hypertension   . Kidney transplant recipient   . 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 Pyelonephritis 06/23/2014  . UTI (urinary tract infection) 01/09/2015  . 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 ARTERIOVENOUS GRAFT PLACEMENT Bilateral    "neither work" (10/24/2017)  . CHOLECYSTECTOMY N/A 06/30/2017   Procedure: LAPAROSCOPIC CHOLECYSTECTOMY WITH INTRAOPERATIVE CHOLANGIOGRAM;  Surgeon: Excell Seltzer, MD;  Location: [facility];  Service: General;  Laterality: N/A;  . ESOPHAGOGASTRODUODENOSCOPY (EGD) WITH PROPOFOL N/A 07/04/2017   Procedure: ESOPHAGOGASTRODUODENOSCOPY (EGD) WITH PROPOFOL;  Surgeon: Clarene Essex, MD;  Location: [facility];  Service: Endoscopy;  Laterality: N/A;  . KIDNEY TRANSPLANT  2007  . PARATHYROIDECTOMY  ?2012   "3/4 removed" (10/24/2017)  . RENAL BIOPSY Bilateral 2003     reports that she has never smoked. She has never used smokeless tobacco. She reports that she does not drink alcohol or use drugs.  Allergies  Allergen Reactions  . Benadryl [Diphenhydramine-Zinc Acetate] Shortness Of Breath  . Motrin [Ibuprofen] Shortness Of Breath and Itching    Per pt  . Banana Other (See Comments)    Sick on the stomach  . Diphenhydramine Hcl     REACTION: Stopped  breathing in ICU  . Doxycycline     Shortness of Breath   . Iron Dextran     REACTION: vein irritation  . Shellfish Allergy Hives    Family History  Problem Relation Age of Onset  . Arthritis Mother   . Diabetes Mother   . Hypertension Mother      Prior to Admission medications   Medication Sig Start Date End Date Taking? Authorizing Provider    ACCU-CHEK SOFTCLIX LANCETS lancets Use to check blood sugar 4 times per day. 12/29/15  Yes Renato Shin, MD  acetaminophen (TYLENOL) 500 MG tablet Take 1,000 mg by mouth daily as needed for moderate pain.    Yes [provider]  calcium elemental as carbonate (BARIATRIC TUMS ULTRA) 400 MG chewable tablet Chew 1,000 mg by mouth 3 (three) times daily as needed for heartburn.    Yes [provider]  cetirizine (ZYRTEC) 10 MG tablet Take 10 mg by mouth daily as needed for allergies.    Yes [provider]  famotidine (PEPCID) 40 MG tablet Take 1 tablet (40 mg total) by mouth daily. Can take up to 2 x day (every 12 hrs) Patient taking differently: Take 40 mg by mouth daily.  07/15/17  Yes Debbe Odea, MD  fluticasone (FLONASE) 50 MCG/ACT nasal spray Place 2 sprays into both nostrils daily. Patient taking differently: Place 2 sprays into both nostrils daily as needed for allergies.  07/20/15  Yes Muthersbaugh, Jarrett Soho, PA-C  furosemide (LASIX) 20 MG tablet Take 20 mg by mouth daily as needed. Feet swelling   Yes [provider]  gabapentin (NEURONTIN) 100 MG capsule Take 200 mg by mouth daily as needed (for feet).  11/12/16  Yes [provider]  HYDROcodone-acetaminophen (NORCO/VICODIN) 5-325 MG tablet Take 1 tablet by mouth every 6 (six) hours as needed. Patient taking differently: Take 1 tablet by mouth every 6 (six) hours as needed for moderate pain.  10/21/17  Yes Virgel Manifold, MD  hydrOXYzine (ATARAX/VISTARIL) 50 MG tablet Take 50 mg by mouth 2 (two) times daily as needed for itching.    Yes [provider]  insulin aspart (NOVOLOG FLEXPEN) 100 UNIT/ML FlexPen Inject 15 Units into the skin 3 (three) times daily with meals. 81-150=10 units 51-200=11 units 201-250=12 units 251-300=13 units 301-350=14 units >351=15 u Per sliding scale   Yes [provider]  insulin degludec (TRESIBA) 100 UNIT/ML SOPN FlexTouch Pen Inject 0.2 mLs (20 Units  total) into the skin at bedtime. Patient taking differently: Inject 20 Units into the skin every morning.  10/26/17  Yes Emokpae, Courage, MD  loperamide (IMODIUM) 2 MG capsule Take 2 mg by mouth as needed for diarrhea or loose stools.    Yes [provider]  metoCLOPramide (REGLAN) 10 MG tablet Take 1 tablet (10 mg total) by mouth every 8 (eight) hours as needed for nausea. 10/26/17  Yes Roxan Hockey, MD  mycophenolate (MYFORTIC) 360 MG TBEC Take 720 mg by mouth 2 (two) times daily.    Yes [provider]  ondansetron (ZOFRAN ODT) 4 MG disintegrating tablet Take 1 tablet (4 mg total) by mouth every 4 (four) hours as needed for nausea or vomiting. 10/26/17  Yes Emokpae, Courage, MD  OVER THE COUNTER MEDICATION Take 1 Can by mouth 2 (two) times daily. Vitamin supplement drink   Yes [provider]  oxyCODONE-acetaminophen (PERCOCET/ROXICET) 5-325 MG tablet Take 1-2 tablets by mouth every 4 (four) hours as needed for severe pain.   Yes [provider]  predniSONE (DELTASONE) 5 MG tablet Take 1 tablet (5 mg total) by mouth at bedtime. Patient taking differently: Take 10 mg by mouth 2 (two) times daily.  07/15/17  Yes Debbe Odea, MD  prochlorperazine (COMPAZINE) 25 MG suppository Place 1 suppository (25 mg total) rectally every 12 (twelve) hours as needed for nausea or vomiting. 10/26/17  Yes Emokpae, Courage, MD  promethazine (PHENERGAN) 25 MG tablet Take 1 tablet (25 mg total) by mouth every 6 (six) hours as needed for nausea or vomiting. 10/20/17  Yes Lawyer, Harrell Gave, PA-C  simvastatin (ZOCOR) 20 MG tablet Take 20 mg by mouth every Monday, Wednesday, and Friday.    Yes [provider]  tacrolimus (PROGRAF) 1 MG capsule Take 3 mg by mouth 2 (two) times daily.    Yes [provider]  cloNIDine (CATAPRES - DOSED IN MG/24 HR) 0.1 mg/24hr patch Place 1 patch (0.1 mg total) onto the skin once a week. Every Wednesday Patient not taking: Reported on  11/11/2017 11/02/17   Roxan Hockey, MD  dicyclomine (BENTYL) 20 MG tablet Take 1 tablet (20 mg total) by mouth 2 (two) times daily. Patient not taking: Reported on 11/11/2017 10/22/17   Caccavale, Sophia, PA-C  potassium chloride (KLOR-CON) 20 MEQ packet Take 40 mEq by mouth daily for 5 days. Patient not taking: Reported on 11/11/2017 10/26/17 10/31/17  Roxan Hockey, MD    Physical Exam: Vitals:   11/11/17 1930 11/11/17 2100 11/11/17 2200 11/11/17 2238  BP: 98/73 105/78 112/86 97/70  Pulse: (!) 101 (!) 114 (!) 121 (!) 123  Resp: 18 17 18 18   Temp:    (!) 97.4 F (36.3 C)  TempSrc:    Oral  SpO2: 99% 100% 100% 98%  Weight:      Height:          Constitutional: NAD, appears uncomfortable Eyes: PERTLA, lids and conjunctivae normal ENMT: Mucous membranes are moist. Posterior pharynx clear of any exudate or lesions.   Neck: normal, supple, no masses, no thyromegaly Respiratory: clear to auscultation bilaterally, no wheezing, no crackles. Normal respiratory effort.    Cardiovascular: Rate ~110 and regular. No extremity edema.   Abdomen: No distension, soft, generalized tenderness, no rebound pain or guarding. Bowel sounds active.  Musculoskeletal: no clubbing / cyanosis. No joint deformity upper and lower extremities.  .  Skin: no significant rashes, lesions, ulcers. Poor turgor. Neurologic: No facial asymmetry. Sensation intact. Strength 5/5 in all 4 limbs.  Psychiatric: Alert and oriented to person, place, and situation. Tearful.     Labs on Admission: I have personally reviewed following labs and imaging studies  CBC: Recent Labs  Lab 11/11/17 1022 11/11/17 1423  WBC  --  18.9*  NEUTROABS  --  17.2*  HGB 9.5* 8.6*  HCT  --  28.9*  MCV  --  95.1  PLT  --  010   Basic Metabolic Panel: Recent Labs  Lab 11/11/17 1423  NA 136  K 4.2  CL 109  CO2 15*  GLUCOSE 461*  BUN 50*  CREATININE 3.21*  CALCIUM 6.5*   GFR: Estimated Creatinine Clearance: 25.9 mL/min (A)  (by C-G formula based on SCr of 3.21 mg/dL (H)). Liver Function Tests: Recent Labs  Lab 11/11/17 1423  AST 12*  ALT 14  ALKPHOS 46  BILITOT 0.7  PROT 6.4*  ALBUMIN 3.0*   Recent Labs  Lab 11/11/17 1423  LIPASE 42   No results for input(s): AMMONIA in the last 168 hours. Coagulation Profile: No results for input(s):  INR, PROTIME in the last 168 hours. Cardiac Enzymes: No results for input(s): CKTOTAL, CKMB, CKMBINDEX, TROPONINI in the last 168 hours. BNP (last 3 results) No results for input(s): PROBNP in the last 8760 hours. HbA1C: No results for input(s): HGBA1C in the last 72 hours. CBG: Recent Labs  Lab 11/11/17 2100 11/11/17 2257  GLUCAP 353* 264*   Lipid Profile: No results for input(s): CHOL, HDL, LDLCALC, TRIG, CHOLHDL, LDLDIRECT in the last 72 hours. Thyroid Function Tests: No results for input(s): TSH, T4TOTAL, FREET4, T3FREE, THYROIDAB in the last 72 hours. Anemia Panel: Recent Labs    11/11/17 1015  FERRITIN 895*  TIBC 175*  IRON 29   Urine analysis:    Component Value Date/Time   COLORURINE YELLOW 10/24/2017 0204   APPEARANCEUR HAZY (A) 10/24/2017 0204   LABSPEC 1.015 10/24/2017 0204   PHURINE 5.0 10/24/2017 0204   GLUCOSEU NEGATIVE 10/24/2017 0204   HGBUR NEGATIVE 10/24/2017 0204   HGBUR negative 01/26/2010 1026   BILIRUBINUR NEGATIVE 10/24/2017 0204   BILIRUBINUR NEG 01/01/2011 1050   KETONESUR 5 (A) 10/24/2017 0204   PROTEINUR 100 (A) 10/24/2017 0204   UROBILINOGEN 0.2 01/09/2015 1333   NITRITE NEGATIVE 10/24/2017 0204   LEUKOCYTESUR MODERATE (A) 10/24/2017 0204   Sepsis Labs: @LABRCNTIP (procalcitonin:4,lacticidven:4) )No results found for this or any previous visit (from the past 240 hour(s)).   Radiological Exams on Admission: Ct Abdomen Pelvis Wo Contrast  Result Date: 11/11/2017 CLINICAL DATA:  Initial evaluation for acute right-sided abdominal and flank pain. History of renal transplant. EXAM: CT ABDOMEN AND PELVIS WITHOUT  CONTRAST TECHNIQUE: Multidetector CT imaging of the abdomen and pelvis was performed following the standard protocol without IV contrast. COMPARISON:  Prior CT from 10/21/2017 FINDINGS: Lower chest: Visualized lung bases are clear. Hepatobiliary: Limited noncontrast evaluation of the liver is unremarkable. Gallbladder surgically absent. No biliary dilatation. Pancreas: Pancreas within normal limits. Spleen: Spleen stable in appearance without acute abnormality. Adrenals/Urinary Tract: Adrenal glands are normal. Native right kidney absent. Native left kidney markedly atrophic without nephrolithiasis or hydronephrosis. No radiopaque calculi seen along the course of the left ureter. No left-sided hydroureter. Right lower quadrant renal transplant again seen. Few scattered renal cysts are stable. Persistent mild to moderate right-sided hydronephrosis, improved relative to previous exam. Associated inflammatory stranding about the renal pelvis and kidney similar to slightly improved as well. No radiopaque calculi within the renal transplant. Transplanted right ureter appears decompressed beyond the UPJ. Partially distended bladder within normal limits. Stomach/Bowel: Circumferential wall thickening about the distal esophagus, similar to previous. Small amount of intraluminal fluid density present within the distal esophageal lumen. Stomach within normal limits. No evidence for bowel obstruction. Normal appendix. No acute inflammatory changes seen about the bowels. Vascular/Lymphatic: Intra-abdominal aorta of normal caliber. Few small probable lymph nodes along the anterior margin of the right psoas, similar to previous. No new adenopathy. Reproductive: Uterus is absent.  Ovaries not discretely identified. Other: No free air or fluid. Musculoskeletal: No acute osseus abnormality. No worrisome lytic or blastic osseous lesions. IMPRESSION: 1. Right renal transplant in place within the right lower quadrant. Persistent but  slightly improved hydronephrosis with pelviectasis, with similar surrounding perinephric stranding. 2. Circumferential wall thickening about the distal esophagus, similar to previous, likely chronic. Finding could be related to reflux disease or possibly esophagitis. Correlation with symptomatology recommended. 3. No other acute intra-abdominal or pelvic process. Normal appendix. Electronically Signed   By: Jeannine Boga M.D.   On: 11/11/2017 19:36    EKG: Independently reviewed. Sinus  tachycardia (rate 111).   Assessment/Plan  1. Acute kidney injury superimposed on CKD III; renal transplant recipient  - Presents with recurrent N/V and is found to have SCr of 3.21, up from 1.66 earlier this month  - Likely a prerenal azotemia in setting of N/V  - Treated in ED with 500 cc NS  - CT abd/pelvis with hydronephrosis that has improved from prior study  - Check urine chemistries, avoid nephrotoxins, renally-dose medications, continue IVF hydration, continue anti-rejection regimen, and repeat chem panel in am   2. Gastroparesis; nausea with vomiting  - Presents with nausea and vomiting  - Has frequent abdominal pain with N/V that has been suspected secondary diabetic gastroparesis  - Has improved with Zofran in ED  - Continue glycemic-control, antiemetics, IVF hydration, monitor lytes, minimize narcotic   3. Insulin-dependent DM  - A1c was 8.7% in August 2019  - Managed at home with Tresiba 20 units qAM and Novolog TID per sliding-scale  - Continue insulin with Lantus and Novolog with dose-reduction initially   4. Chronic pain  - Continue home-regimen with gabapentin, prn APAP, and prn Percocet    5. Normocytic anemia  - Hgb is 8.6 on admission, down from recent priors in 9-12 range   - No bleeding noted  - Managed by nephrology with Procrit  - Type and screen, repeat CBC in am with dilutional drop anticipated   6. Leukocytosis  - WBC is 18,900 on admission   - No fever, no  respiratory complaints, CT abd/pelvis without acute findings  - Possibly secondary to prednisone  - Check UA, culture if febrile    DVT prophylaxis: SCD's  Code Status: Full  Family Communication: Discussed with patient  Consults called: None  Admission status: Observation     Vianne Bulls, MD [facility] Pager [phone]  If 7PM-7AM, please contact night-coverage [email or web address] Password [email or web address]  11/11/2017, 11:25 PM

## 2017-11-11 NOTE — ED Notes (Addendum)
ED Provider at bedside. 

## 2017-11-11 NOTE — ED Notes (Signed)
Patient states that she refuses to be admitted if the physician is not going to give her pain medication stronger than Tylenol. Spoke with MD and advised him of this and he states that he would write for the pain medications that she is taking at home. When patient advised of this, she states "are you going to give it to me in my IV, what if I hurt in the middle of the night?". Advised that her pain meds would be given just as prescribed at home. Patient not happy about this.

## 2017-11-11 NOTE — ED Triage Notes (Addendum)
Pt here from home c/o migraine since last night. Also c/o nausea/vomiting. Pt was seen at the clinic for dizziness this morning and got her "anemia shot." CBG 390 for EMS, hx diabetes. Pt hypotensive on EMS arrival at 80s/40s. Positive for orthostatics for EMS. Given 200 mls NS with BP improvement. VSS. Bed locked in lowest position and call light within reach.

## 2017-11-11 NOTE — ED Notes (Signed)
Patient transported to CT 

## 2017-11-12 DIAGNOSIS — Z886 Allergy status to analgesic agent status: Secondary | ICD-10-CM | POA: Diagnosis not present

## 2017-11-12 DIAGNOSIS — Z7951 Long term (current) use of inhaled steroids: Secondary | ICD-10-CM | POA: Diagnosis not present

## 2017-11-12 DIAGNOSIS — N2581 Secondary hyperparathyroidism of renal origin: Secondary | ICD-10-CM | POA: Diagnosis present

## 2017-11-12 DIAGNOSIS — E108 Type 1 diabetes mellitus with unspecified complications: Secondary | ICD-10-CM | POA: Diagnosis not present

## 2017-11-12 DIAGNOSIS — I129 Hypertensive chronic kidney disease with stage 1 through stage 4 chronic kidney disease, or unspecified chronic kidney disease: Secondary | ICD-10-CM | POA: Diagnosis present

## 2017-11-12 DIAGNOSIS — E872 Acidosis: Secondary | ICD-10-CM | POA: Diagnosis not present

## 2017-11-12 DIAGNOSIS — Z888 Allergy status to other drugs, medicaments and biological substances status: Secondary | ICD-10-CM | POA: Diagnosis not present

## 2017-11-12 DIAGNOSIS — N179 Acute kidney failure, unspecified: Secondary | ICD-10-CM

## 2017-11-12 DIAGNOSIS — G43909 Migraine, unspecified, not intractable, without status migrainosus: Secondary | ICD-10-CM | POA: Diagnosis present

## 2017-11-12 DIAGNOSIS — Z8261 Family history of arthritis: Secondary | ICD-10-CM | POA: Diagnosis not present

## 2017-11-12 DIAGNOSIS — N170 Acute kidney failure with tubular necrosis: Secondary | ICD-10-CM | POA: Diagnosis present

## 2017-11-12 DIAGNOSIS — Z91013 Allergy to seafood: Secondary | ICD-10-CM | POA: Diagnosis not present

## 2017-11-12 DIAGNOSIS — Z940 Kidney transplant status: Secondary | ICD-10-CM | POA: Diagnosis not present

## 2017-11-12 DIAGNOSIS — Z7984 Long term (current) use of oral hypoglycemic drugs: Secondary | ICD-10-CM | POA: Diagnosis not present

## 2017-11-12 DIAGNOSIS — Z9049 Acquired absence of other specified parts of digestive tract: Secondary | ICD-10-CM | POA: Diagnosis not present

## 2017-11-12 DIAGNOSIS — E1065 Type 1 diabetes mellitus with hyperglycemia: Secondary | ICD-10-CM | POA: Diagnosis not present

## 2017-11-12 DIAGNOSIS — Z833 Family history of diabetes mellitus: Secondary | ICD-10-CM | POA: Diagnosis not present

## 2017-11-12 DIAGNOSIS — D631 Anemia in chronic kidney disease: Secondary | ICD-10-CM | POA: Diagnosis not present

## 2017-11-12 DIAGNOSIS — Z8249 Family history of ischemic heart disease and other diseases of the circulatory system: Secondary | ICD-10-CM | POA: Diagnosis not present

## 2017-11-12 DIAGNOSIS — E785 Hyperlipidemia, unspecified: Secondary | ICD-10-CM | POA: Diagnosis present

## 2017-11-12 DIAGNOSIS — F819 Developmental disorder of scholastic skills, unspecified: Secondary | ICD-10-CM | POA: Diagnosis present

## 2017-11-12 DIAGNOSIS — N183 Chronic kidney disease, stage 3 (moderate): Secondary | ICD-10-CM

## 2017-11-12 DIAGNOSIS — Z7952 Long term (current) use of systemic steroids: Secondary | ICD-10-CM | POA: Diagnosis not present

## 2017-11-12 DIAGNOSIS — K3184 Gastroparesis: Secondary | ICD-10-CM | POA: Diagnosis not present

## 2017-11-12 DIAGNOSIS — Z881 Allergy status to other antibiotic agents status: Secondary | ICD-10-CM | POA: Diagnosis not present

## 2017-11-12 DIAGNOSIS — I959 Hypotension, unspecified: Secondary | ICD-10-CM | POA: Diagnosis not present

## 2017-11-12 DIAGNOSIS — Z79899 Other long term (current) drug therapy: Secondary | ICD-10-CM | POA: Diagnosis not present

## 2017-11-12 DIAGNOSIS — Z794 Long term (current) use of insulin: Secondary | ICD-10-CM | POA: Diagnosis not present

## 2017-11-12 DIAGNOSIS — E1022 Type 1 diabetes mellitus with diabetic chronic kidney disease: Secondary | ICD-10-CM | POA: Diagnosis present

## 2017-11-12 LAB — URINALYSIS, ROUTINE W REFLEX MICROSCOPIC
BACTERIA UA: NONE SEEN
Bilirubin Urine: NEGATIVE
Glucose, UA: NEGATIVE mg/dL
HGB URINE DIPSTICK: NEGATIVE
Ketones, ur: NEGATIVE mg/dL
LEUKOCYTES UA: NEGATIVE
Nitrite: NEGATIVE
Protein, ur: 100 mg/dL — AB
Specific Gravity, Urine: 1.014 (ref 1.005–1.030)
pH: 5 (ref 5.0–8.0)

## 2017-11-12 LAB — CBC WITH DIFFERENTIAL/PLATELET
ABS IMMATURE GRANULOCYTES: 0.1 10*3/uL (ref 0.0–0.1)
Basophils Absolute: 0 10*3/uL (ref 0.0–0.1)
Basophils Relative: 0 %
EOS ABS: 0.2 10*3/uL (ref 0.0–0.7)
EOS PCT: 1 %
HEMATOCRIT: 26.3 % — AB (ref 36.0–46.0)
Hemoglobin: 7.9 g/dL — ABNORMAL LOW (ref 12.0–15.0)
IMMATURE GRANULOCYTES: 1 %
Lymphocytes Relative: 7 %
Lymphs Abs: 0.9 10*3/uL (ref 0.7–4.0)
MCH: 28.6 pg (ref 26.0–34.0)
MCHC: 30 g/dL (ref 30.0–36.0)
MCV: 95.3 fL (ref 78.0–100.0)
MONO ABS: 0.6 10*3/uL (ref 0.1–1.0)
Monocytes Relative: 5 %
Neutro Abs: 10.2 10*3/uL — ABNORMAL HIGH (ref 1.7–7.7)
Neutrophils Relative %: 86 %
Platelets: 171 10*3/uL (ref 150–400)
RBC: 2.76 MIL/uL — ABNORMAL LOW (ref 3.87–5.11)
RDW: 14 % (ref 11.5–15.5)
WBC: 12 10*3/uL — ABNORMAL HIGH (ref 4.0–10.5)

## 2017-11-12 LAB — TYPE AND SCREEN
ABO/RH(D): A POS
Antibody Screen: NEGATIVE

## 2017-11-12 LAB — BASIC METABOLIC PANEL
Anion gap: 10 (ref 5–15)
BUN: 52 mg/dL — AB (ref 6–20)
CALCIUM: 6.3 mg/dL — AB (ref 8.9–10.3)
CO2: 17 mmol/L — ABNORMAL LOW (ref 22–32)
CREATININE: 3.25 mg/dL — AB (ref 0.44–1.00)
Chloride: 112 mmol/L — ABNORMAL HIGH (ref 98–111)
GFR calc Af Amer: 20 mL/min — ABNORMAL LOW (ref >60)
GFR calc non Af Amer: 17 mL/min — ABNORMAL LOW (ref >60)
GLUCOSE: 201 mg/dL — AB (ref 70–99)
Potassium: 3.9 mmol/L (ref 3.5–5.1)
SODIUM: 139 mmol/L (ref 135–145)

## 2017-11-12 LAB — SODIUM, URINE, RANDOM: SODIUM UR: 22 mmol/L

## 2017-11-12 LAB — GLUCOSE, CAPILLARY
Glucose-Capillary: 314 mg/dL — ABNORMAL HIGH (ref 70–99)
Glucose-Capillary: 494 mg/dL — ABNORMAL HIGH (ref 70–99)
Glucose-Capillary: 500 mg/dL — ABNORMAL HIGH (ref 70–99)

## 2017-11-12 LAB — CREATININE, URINE, RANDOM: Creatinine, Urine: 196.63 mg/dL

## 2017-11-12 LAB — ABO/RH: ABO/RH(D): A POS

## 2017-11-12 MED ORDER — INSULIN GLARGINE 100 UNIT/ML ~~LOC~~ SOLN
20.0000 [IU] | Freq: Every day | SUBCUTANEOUS | Status: DC
  Administered 2017-11-13 – 2017-11-14 (×2): 20 [IU] via SUBCUTANEOUS
  Filled 2017-11-12 (×2): qty 0.2

## 2017-11-12 MED ORDER — PREDNISONE 20 MG PO TABS: 20.0000 mg | ORAL_TABLET | Freq: Every day | ORAL | Status: DC

## 2017-11-12 MED ORDER — FAMOTIDINE 20 MG PO TABS: 20.0000 mg | ORAL_TABLET | Freq: Every day | ORAL | Status: DC

## 2017-11-12 MED ORDER — LACTATED RINGERS IV SOLN
INTRAVENOUS | Status: DC
  Administered 2017-11-12 – 2017-11-13 (×3): via INTRAVENOUS

## 2017-11-12 MED ORDER — LACTATED RINGERS IV BOLUS
500.0000 mL | Freq: Once | INTRAVENOUS | Status: AC
  Administered 2017-11-12: 500 mL via INTRAVENOUS

## 2017-11-12 MED ORDER — METHYLPREDNISOLONE SODIUM SUCC 40 MG IJ SOLR
40.0000 mg | Freq: Every day | INTRAMUSCULAR | Status: AC
  Administered 2017-11-12 – 2017-11-14 (×3): 40 mg via INTRAVENOUS
  Filled 2017-11-12 (×3): qty 1

## 2017-11-12 MED ORDER — INSULIN ASPART 100 UNIT/ML ~~LOC~~ SOLN: 0.0000 [IU] | Freq: Every day | SUBCUTANEOUS | Status: DC

## 2017-11-12 MED ORDER — INSULIN ASPART 100 UNIT/ML ~~LOC~~ SOLN
0.0000 [IU] | Freq: Three times a day (TID) | SUBCUTANEOUS | Status: DC
  Administered 2017-11-12: 20 [IU] via SUBCUTANEOUS
  Administered 2017-11-13 (×2): 15 [IU] via SUBCUTANEOUS
  Administered 2017-11-13: 4 [IU] via SUBCUTANEOUS
  Administered 2017-11-14 (×2): 7 [IU] via SUBCUTANEOUS

## 2017-11-12 MED ORDER — INSULIN ASPART 100 UNIT/ML ~~LOC~~ SOLN
25.0000 [IU] | Freq: Once | SUBCUTANEOUS | Status: AC
  Administered 2017-11-12: 25 [IU] via SUBCUTANEOUS

## 2017-11-12 MED ORDER — HEPARIN SODIUM (PORCINE) 5000 UNIT/ML IJ SOLN: 5000.0000 [IU] | Freq: Three times a day (TID) | INTRAMUSCULAR | Status: DC

## 2017-11-12 MED ORDER — INSULIN GLARGINE 100 UNIT/ML ~~LOC~~ SOLN
10.0000 [IU] | Freq: Once | SUBCUTANEOUS | Status: AC
  Administered 2017-11-12: 10 [IU] via SUBCUTANEOUS
  Filled 2017-11-12: qty 0.1

## 2017-11-12 NOTE — Progress Notes (Signed)
CBG is 500 at this time, messaged MD for orders. Awaiting response.   Eleanora Neighbor, RN

## 2017-11-12 NOTE — Progress Notes (Signed)
Messaged MD regarding BP of 85/54. Awaiting response.   [name]
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
## 2017-11-12 NOTE — Progress Notes (Signed)
New Admission Note:  Arrival Method: Via wheelchair from ED Mental Orientation: Alert & Oriented x4 Telemetry: None Assessment: Completed Skin: Refer to flowsheet IV: Right FA Pain: 9/10 Tubes: Safety Measures: Safety Fall Prevention Plan discussed with patient. Admission: Completed [facility] [place] Orientation: Patient has been orientated to the room, unit and the staff.  Orders have been reviewed and are being implemented. Will continue to monitor the patient. Call light has been placed within reach.   Vassie Moselle, RN  Phone Number: [phone]

## 2017-11-13 DIAGNOSIS — D631 Anemia in chronic kidney disease: Secondary | ICD-10-CM

## 2017-11-13 LAB — CBC
HEMATOCRIT: 23.1 % — AB (ref 36.0–46.0)
HEMOGLOBIN: 7.3 g/dL — AB (ref 12.0–15.0)
MCH: 29.3 pg (ref 26.0–34.0)
MCHC: 31.6 g/dL (ref 30.0–36.0)
MCV: 92.8 fL (ref 78.0–100.0)
Platelets: 150 10*3/uL (ref 150–400)
RBC: 2.49 MIL/uL — AB (ref 3.87–5.11)
RDW: 13.6 % (ref 11.5–15.5)
WBC: 7.6 10*3/uL (ref 4.0–10.5)

## 2017-11-13 LAB — RENAL FUNCTION PANEL
ANION GAP: 8 (ref 5–15)
Albumin: 2.4 g/dL — ABNORMAL LOW (ref 3.5–5.0)
BUN: 48 mg/dL — ABNORMAL HIGH (ref 6–20)
CHLORIDE: 109 mmol/L (ref 98–111)
CO2: 18 mmol/L — AB (ref 22–32)
Calcium: 6.5 mg/dL — ABNORMAL LOW (ref 8.9–10.3)
Creatinine, Ser: 2.74 mg/dL — ABNORMAL HIGH (ref 0.44–1.00)
GFR calc non Af Amer: 21 mL/min — ABNORMAL LOW (ref >60)
GFR, EST AFRICAN AMERICAN: 25 mL/min — AB (ref >60)
GLUCOSE: 199 mg/dL — AB (ref 70–99)
Phosphorus: 3.3 mg/dL (ref 2.5–4.6)
Potassium: 4.4 mmol/L (ref 3.5–5.1)
SODIUM: 135 mmol/L (ref 135–145)

## 2017-11-13 LAB — UREA NITROGEN, URINE: UREA NITROGEN UR: 512 mg/dL

## 2017-11-13 LAB — GLUCOSE, CAPILLARY
GLUCOSE-CAPILLARY: 301 mg/dL — AB (ref 70–99)
GLUCOSE-CAPILLARY: 315 mg/dL — AB (ref 70–99)
Glucose-Capillary: 191 mg/dL — ABNORMAL HIGH (ref 70–99)
Glucose-Capillary: 161 mg/dL — ABNORMAL HIGH (ref 70–99)

## 2017-11-13 LAB — OCCULT BLOOD X 1 CARD TO LAB, STOOL: Fecal Occult Bld: NEGATIVE

## 2017-11-13 MED ORDER — INSULIN ASPART 100 UNIT/ML ~~LOC~~ SOLN
10.0000 [IU] | Freq: Once | SUBCUTANEOUS | Status: AC
  Administered 2017-11-13: 10 [IU] via SUBCUTANEOUS

## 2017-11-13 MED ORDER — SODIUM CHLORIDE 0.9 % IV SOLN
510.0000 mg | Freq: Once | INTRAVENOUS | Status: AC
  Administered 2017-11-13: 510 mg via INTRAVENOUS
  Filled 2017-11-13: qty 17

## 2017-11-13 MED ORDER — PANTOPRAZOLE SODIUM 40 MG PO TBEC
40.0000 mg | DELAYED_RELEASE_TABLET | Freq: Every day | ORAL | Status: DC
  Administered 2017-11-13 – 2017-11-14 (×2): 40 mg via ORAL
  Filled 2017-11-13 (×2): qty 1

## 2017-11-13 MED ORDER — DARBEPOETIN ALFA 40 MCG/0.4ML IJ SOSY: 40.0000 ug | PREFILLED_SYRINGE | INTRAMUSCULAR | Status: DC

## 2017-11-13 MED ORDER — HYDROCODONE-ACETAMINOPHEN 5-325 MG PO TABS: 1.0000 | ORAL_TABLET | ORAL | Status: DC | PRN
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
## 2017-11-13 NOTE — Progress Notes (Signed)
Patient is refusing her bed alarm. Will continue to monitor.   Farley Ly RN

## 2017-11-13 NOTE — Progress Notes (Signed)
Messaged MD to verify if pt is Q4 or ACHS. Both are showing on worklist. Awaiting response.   Eleanora Neighbor, RN

## 2017-11-13 NOTE — Progress Notes (Signed)
Went to go administer patient's Novolog and patient stated, "she isn't taking any medication until she sees a doctor." She said she is "pissed off" and wants to leave if a MD doesn't see her soon. Will continue to monitor.   Farley Ly RN

## 2017-11-13 NOTE — Progress Notes (Signed)
Patient ID: [name], female   DOB: April 20, 1985, 32 y.o.   MRN: [id number] [facility] Progress Note   Assessment/ Plan:   1.  Acute kidney injury on chronic kidney disease stage III T:  Improving renal function noted on labs this morning in response to intravenous fluids corroborating clinical suspicion that this was hemodynamically mediated acute kidney injury versus mild ATN.  I did increase her corticosteroids transiently to Solu-Medrol 40 mg daily to try and alleviate any clinically significant "corticosteroid withdrawal" that she may have suffered from missing doses while recently in jail.  Plan to transition back to oral prednisone in 2 days.  Electrolytes within acceptable range, improving urine output and without any uremic signs or symptoms.  Yesterday, I called the on-call physician for Dr. Ailene Rud at [facility] and discussed her presentation as well as went over the details of her recent hospitalization at [facility] in [place]. 2.  Hypotension/tachycardia:  Possibly from volume depletion versus corticosteroid withdrawal/interruption of therapy and consequent adrenal insufficiency-improving with intravenous fluids and adjustment of corticosteroid dosing. 3.  Anemia: She is anemic and got a dose of Aranesp/ESA last week-we will give her IV Feraheme today 4.  Non-anion gap metabolic acidosis: Secondary to her chronic kidney disease/exacerbation with acute kidney injury.  Improving with intravenous fluids/renal recovery.  Subjective:   Reports to be feeling better today and denies any chest pain or shortness of breath.   Objective:   BP (!) 144/93 (BP Location: Left Arm)   Pulse 96   Temp 98.6 F (37 C) (Oral)   Resp 18   Ht 5\' 6"  (1.676 m)   Wt 77.1 kg   SpO2 96%   BMI 27.43 kg/m   Intake/Output Summary (Last 24 hours) at 11/13/2017 1032 Last data filed at 11/13/2017 0900 Gross per 24 hour  Intake 1355 ml  Output 1900 ml  Net -545 ml   Weight change: -0.012  kg  Physical Exam: Gen: Sitting up comfortably in bed CVS: Pulse regular rhythm, normal rate, S1 and S2 normal Resp: Clear to auscultation, no rales/rhonchi Abd: Soft, obese, persistent mild right lower quadrant tenderness without guarding or rigidity Ext: Without lower extremity edema  Imaging: Ct Abdomen Pelvis Wo Contrast  Result Date: 11/11/2017 CLINICAL DATA:  Initial evaluation for acute right-sided abdominal and flank pain. History of renal transplant. EXAM: CT ABDOMEN AND PELVIS WITHOUT CONTRAST TECHNIQUE: Multidetector CT imaging of the abdomen and pelvis was performed following the standard protocol without IV contrast. COMPARISON:  Prior CT from 10/21/2017 FINDINGS: Lower chest: Visualized lung bases are clear. Hepatobiliary: Limited noncontrast evaluation of the liver is unremarkable. Gallbladder surgically absent. No biliary dilatation. Pancreas: Pancreas within normal limits. Spleen: Spleen stable in appearance without acute abnormality. Adrenals/Urinary Tract: Adrenal glands are normal. Native right kidney absent. Native left kidney markedly atrophic without nephrolithiasis or hydronephrosis. No radiopaque calculi seen along the course of the left ureter. No left-sided hydroureter. Right lower quadrant renal transplant again seen. Few scattered renal cysts are stable. Persistent mild to moderate right-sided hydronephrosis, improved relative to previous exam. Associated inflammatory stranding about the renal pelvis and kidney similar to slightly improved as well. No radiopaque calculi within the renal transplant. Transplanted right ureter appears decompressed beyond the UPJ. Partially distended bladder within normal limits. Stomach/Bowel: Circumferential wall thickening about the distal esophagus, similar to previous. Small amount of intraluminal fluid density present within the distal esophageal lumen. Stomach within normal limits. No evidence for bowel obstruction. Normal appendix. No  acute inflammatory  changes seen about the bowels. Vascular/Lymphatic: Intra-abdominal aorta of normal caliber. Few small probable lymph nodes along the anterior margin of the right psoas, similar to previous. No new adenopathy. Reproductive: Uterus is absent.  Ovaries not discretely identified. Other: No free air or fluid. Musculoskeletal: No acute osseus abnormality. No worrisome lytic or blastic osseous lesions. IMPRESSION: 1. Right renal transplant in place within the right lower quadrant. Persistent but slightly improved hydronephrosis with pelviectasis, with similar surrounding perinephric stranding. 2. Circumferential wall thickening about the distal esophagus, similar to previous, likely chronic. Finding could be related to reflux disease or possibly esophagitis. Correlation with symptomatology recommended. 3. No other acute intra-abdominal or pelvic process. Normal appendix. Electronically Signed   By: Jeannine Boga M.D.   On: 11/11/2017 19:36    Labs: BMET Recent Labs  Lab 11/11/17 1423 11/12/17 0706 11/13/17 0645  NA 136 139 135  K 4.2 3.9 4.4  CL 109 112* 109  CO2 15* 17* 18*  GLUCOSE 461* 201* 199*  BUN 50* 52* 48*  CREATININE 3.21* 3.25* 2.74*  CALCIUM 6.5* 6.3* 6.5*  PHOS  --   --  3.3   CBC Recent Labs  Lab 11/11/17 1022 11/11/17 1423 11/12/17 0706 11/13/17 0645  WBC  --  18.9* 12.0* 7.6  NEUTROABS  --  17.2* 10.2*  --   HGB 9.5* 8.6* 7.9* 7.3*  HCT  --  28.9* 26.3* 23.1*  MCV  --  95.1 95.3 92.8  PLT  --  199 171 150    Medications:    . insulin aspart  0-20 Units Subcutaneous TID WC  . insulin aspart  0-5 Units Subcutaneous QHS  . insulin glargine  20 Units Subcutaneous Daily  . methylPREDNISolone (SOLU-MEDROL) injection  40 mg Intravenous Daily   Followed by  . [START ON 11/15/2017] predniSONE  20 mg Oral Q breakfast  . mycophenolate  720 mg Oral BID  . pantoprazole  40 mg Oral Daily  . [START ON 11/14/2017] simvastatin  20 mg Oral Q M,W,F  .  tacrolimus  3 mg Oral BID   Elmarie Shiley, MD 11/13/2017, 10:32 AM

## 2017-11-14 LAB — CBC
HCT: 24 % — ABNORMAL LOW (ref 36.0–46.0)
HEMOGLOBIN: 7.4 g/dL — AB (ref 12.0–15.0)
MCH: 28.6 pg (ref 26.0–34.0)
MCHC: 30.8 g/dL (ref 30.0–36.0)
MCV: 92.7 fL (ref 78.0–100.0)
PLATELETS: 169 10*3/uL (ref 150–400)
RBC: 2.59 MIL/uL — AB (ref 3.87–5.11)
RDW: 13.9 % (ref 11.5–15.5)
WBC: 8 10*3/uL (ref 4.0–10.5)

## 2017-11-14 LAB — RENAL FUNCTION PANEL
ALBUMIN: 2.6 g/dL — AB (ref 3.5–5.0)
Anion gap: 8 (ref 5–15)
BUN: 35 mg/dL — ABNORMAL HIGH (ref 6–20)
CALCIUM: 6.9 mg/dL — AB (ref 8.9–10.3)
CO2: 20 mmol/L — ABNORMAL LOW (ref 22–32)
CREATININE: 2.08 mg/dL — AB (ref 0.44–1.00)
Chloride: 110 mmol/L (ref 98–111)
GFR, EST AFRICAN AMERICAN: 35 mL/min — AB (ref >60)
GFR, EST NON AFRICAN AMERICAN: 30 mL/min — AB (ref >60)
Glucose, Bld: 277 mg/dL — ABNORMAL HIGH (ref 70–99)
PHOSPHORUS: 2.9 mg/dL (ref 2.5–4.6)
Potassium: 4.2 mmol/L (ref 3.5–5.1)
Sodium: 138 mmol/L (ref 135–145)

## 2017-11-14 MED ORDER — PREDNISONE 10 MG PO TABS: 30.0000 mg | ORAL_TABLET | Freq: Every day | ORAL | 0 refills | Status: DC

## 2017-11-14 MED ORDER — PANTOPRAZOLE SODIUM 40 MG PO TBEC: 40.0000 mg | DELAYED_RELEASE_TABLET | Freq: Every day | ORAL | 0 refills | Status: DC

## 2017-11-14 MED ORDER — HYDROCODONE-ACETAMINOPHEN 5-325 MG PO TABS: 1.0000 | ORAL_TABLET | Freq: Four times a day (QID) | ORAL | 0 refills | Status: DC | PRN

## 2017-11-14 MED ORDER — OXYCODONE-ACETAMINOPHEN 5-325 MG PO TABS: 1.0000 | ORAL_TABLET | ORAL | 0 refills | Status: AC | PRN

## 2017-11-14 MED ORDER — LACTATED RINGERS IV SOLN
INTRAVENOUS | Status: DC
  Administered 2017-11-14 (×2): via INTRAVENOUS

## 2017-11-14 NOTE — Discharge Summary (Signed)
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 F.N deficits, Normal affect Symmetrical Chest wall movement, Good air movement bilaterally, CTAB RRR,No Gallops,Rubs or new Murmurs, No Parasternal Heave +ve B.Sounds, Abd Soft, minimal right lower quadrant tenderness, no rebound -guarding or rigidity. No Cyanosis, Clubbing or edema, No new Rash or bruise  Data Review   CBC w Diff:  Lab Results  Component Value Date   WBC 8.0 11/14/2017   HGB 7.4 (L) 11/14/2017   HCT 24.0 (L) 11/14/2017   PLT 169 11/14/2017   PLT 206 07/17/2009   LYMPHOPCT 7 11/12/2017   MONOPCT 5 11/12/2017   EOSPCT 1 11/12/2017   BASOPCT 0 11/12/2017  CMP:  Lab Results  Component Value Date   NA 138 11/14/2017   K 4.2 11/14/2017   CL 110 11/14/2017   CO2 20 (L) 11/14/2017   BUN 35 (H) 11/14/2017   CREATININE 2.08 (H) 11/14/2017   PROT 6.4 (L) 11/11/2017   ALBUMIN 2.6 (L)  11/14/2017   BILITOT 0.7 11/11/2017   ALKPHOS 46 11/11/2017   AST 12 (L) 11/11/2017   ALT 14 11/11/2017  .   Total Time in preparing paper work, data evaluation and todays exam - 1 minutes  Phillips Climes M.D on 11/14/2017 at 12:47 PM  [facility]   Office  [phone]

## 2017-11-14 NOTE — Progress Notes (Signed)
Assessment:   1.Acute kidney injury on chronic kidney disease stage III/ Renal Transplant 2. Hypotension/tachycardia:Improved 3. Anemia: s/p ESA and iron 4. Non-anion gap metabolic acidosis: Resolved  Rec: OK to discharge on 30mg  prednisone daily.  She is scheduled to see Dr Lorrene Reid tomorrow on 9/3 and she can manage further med changes.  She missed 1.5 days of medication so if an underlying rejection is present it is mild.    Subjective: Interval History:   Objective: Vital signs in last 24 hours: Temp:  [98.2 F (36.8 C)-98.7 F (37.1 C)] 98.4 F (36.9 C) (09/02 0916) Pulse Rate:  [85-91] 87 (09/02 0916) Resp:  [14-18] 18 (09/02 0916) BP: (120-164)/(78-106) 120/78 (09/02 0916) SpO2:  [96 %-99 %] 99 % (09/02 0916) Weight:  [77.1 kg] 77.1 kg (09/02 0408) Weight change: 0 kg  Intake/Output from previous day: 09/01 0701 - 09/02 0700 In: 3244 [P.O.:1020; I.V.:2224] Out: 2800 [Urine:2800] Intake/Output this shift: Total I/O In: 120 [P.O.:120] Out: 0   General appearance: alert and cooperative Resp: clear to auscultation bilaterally Chest wall: no tenderness Cardio: regular rate and rhythm, S1, S2 normal, no murmur, click, rub or gallop GI: renal allograft RLQ tender Extremities: extremities normal, atraumatic, no cyanosis or edema  Lab Results: Recent Labs    11/13/17 0645 11/14/17 0435  WBC 7.6 8.0  HGB 7.3* 7.4*  HCT 23.1* 24.0*  PLT 150 169   BMET:  Recent Labs    11/13/17 0645 11/14/17 0435  NA 135 138  K 4.4 4.2  CL 109 110  CO2 18* 20*  GLUCOSE 199* 277*  BUN 48* 35*  CREATININE 2.74* 2.08*  CALCIUM 6.5* 6.9*   No results for input(s): PTH in the last 72 hours. Iron Studies: No results for input(s): IRON, TIBC, TRANSFERRIN, FERRITIN in the last 72 hours. Studies/Results: No results found.  Scheduled: . insulin aspart  0-20 Units Subcutaneous TID WC  . insulin aspart  0-5 Units Subcutaneous QHS  . insulin glargine  20 Units Subcutaneous  Daily  . mycophenolate  720 mg Oral BID  . pantoprazole  40 mg Oral Daily  . [START ON 11/15/2017] predniSONE  20 mg Oral Q breakfast  . simvastatin  20 mg Oral Q M,W,F  . tacrolimus  3 mg Oral BID      LOS: 2 days   Estanislado Emms 11/14/2017,12:03 PM

## 2017-11-14 NOTE — Progress Notes (Signed)
Patient Discharge: Disposition: Patient discharged to home. Education: Reviewed medications, prescriptions, follow-up appointments and discharge instructions, verbalized understanding. IV: Discontinued IV before discharge. Telemetry: N/A Transportation: Patient walked out of the unit. Belongings: Patient took all her belongings with her.

## 2017-11-14 NOTE — Discharge Instructions (Signed)
Follow with Primary MD [name], MD in 7 days   Get CBC, CMP,  checked  by Primary MD next visit.    Activity: As tolerated with Full fall precautions use walker/cane & assistance as needed   Disposition Home    Diet: Carbohydrate modified    On your next visit with your primary care physician please Get Medicines reviewed and adjusted.   Please request your Prim.MD to go over all Hospital Tests and Procedure/Radiological results at the follow up, please get all Hospital records sent to your Prim MD by signing hospital release before you go home.   If you experience worsening of your admission symptoms, develop shortness of breath, life threatening emergency, suicidal or homicidal thoughts you must seek medical attention immediately by calling 911 or calling your MD immediately  if symptoms less severe.  You Must read complete instructions/literature along with all the possible adverse reactions/side effects for all the Medicines you take and that have been prescribed to you. Take any new Medicines after you have completely understood and accpet all the possible adverse reactions/side effects.   Do not drive, operating heavy machinery, perform activities at heights, swimming or participation in water activities or provide baby sitting services if your were admitted for syncope or siezures until you have seen by Primary MD or a Neurologist and advised to do so again.  Do not drive when taking Pain medications.    Do not take more than prescribed Pain, Sleep and Anxiety Medications  Special Instructions: If you have smoked or chewed Tobacco  in the last 2 yrs please stop smoking, stop any regular Alcohol  and or any Recreational drug use.  Wear Seat belts while driving.   Please note  You were cared for by a hospitalist during your hospital stay. If you have any questions about your discharge medications or the care you received while you were in the hospital after you are  discharged, you can call the unit and asked to speak with the hospitalist on call if the hospitalist that took care of you is not available. Once you are discharged, your primary care physician will handle any further medical issues. Please note that NO REFILLS for any discharge medications will be authorized once you are discharged, as it is imperative that you return to your primary care physician (or establish a relationship with a primary care physician if you do not have one) for your aftercare needs so that they can reassess your need for medications and monitor your lab values.

## 2017-11-15 DIAGNOSIS — Z940 Kidney transplant status: Secondary | ICD-10-CM | POA: Diagnosis not present

## 2017-11-15 DIAGNOSIS — N059 Unspecified nephritic syndrome with unspecified morphologic changes: Secondary | ICD-10-CM | POA: Diagnosis not present

## 2017-11-15 DIAGNOSIS — E139 Other specified diabetes mellitus without complications: Secondary | ICD-10-CM | POA: Diagnosis not present

## 2017-11-15 DIAGNOSIS — T8619 Other complication of kidney transplant: Secondary | ICD-10-CM | POA: Diagnosis not present

## 2017-11-15 DIAGNOSIS — D631 Anemia in chronic kidney disease: Secondary | ICD-10-CM | POA: Diagnosis not present

## 2017-11-15 DIAGNOSIS — T8691 Unspecified transplanted organ and tissue rejection: Secondary | ICD-10-CM | POA: Diagnosis not present

## 2017-11-15 DIAGNOSIS — E785 Hyperlipidemia, unspecified: Secondary | ICD-10-CM | POA: Diagnosis not present

## 2017-11-15 LAB — GLUCOSE, CAPILLARY
GLUCOSE-CAPILLARY: 193 mg/dL — AB (ref 70–99)
GLUCOSE-CAPILLARY: 349 mg/dL — AB (ref 70–99)
Glucose-Capillary: 108 mg/dL — ABNORMAL HIGH (ref 70–99)

## 2017-11-16 LAB — TACROLIMUS LEVEL: Tacrolimus (FK506) - LabCorp: 8.5 ng/mL (ref 2.0–20.0)

## 2017-11-16 LAB — GLUCOSE, CAPILLARY
Glucose-Capillary: 229 mg/dL — ABNORMAL HIGH (ref 70–99)
Glucose-Capillary: 216 mg/dL — ABNORMAL HIGH (ref 70–99)

## 2017-11-24 ENCOUNTER — Ambulatory Visit (HOSPITAL_COMMUNITY)
Admission: RE | Admit: 2017-11-24 | Discharge: 2017-11-24 | Disposition: A | Discharge status: Home / Self Care | Payer: Medicare Other | Source: Ambulatory Visit | Attending: Nephrology | Admitting: Nephrology

## 2017-11-24 VITALS — BP 162/108 | HR 106 | Temp 98.7°F

## 2017-11-24 DIAGNOSIS — D631 Anemia in chronic kidney disease: Secondary | ICD-10-CM | POA: Insufficient documentation

## 2017-11-24 DIAGNOSIS — Z5181 Encounter for therapeutic drug level monitoring: Secondary | ICD-10-CM | POA: Diagnosis not present

## 2017-11-24 DIAGNOSIS — N182 Chronic kidney disease, stage 2 (mild): Secondary | ICD-10-CM

## 2017-11-24 DIAGNOSIS — N183 Chronic kidney disease, stage 3 (moderate): Secondary | ICD-10-CM | POA: Diagnosis not present

## 2017-11-24 DIAGNOSIS — Z79899 Other long term (current) drug therapy: Secondary | ICD-10-CM | POA: Insufficient documentation

## 2017-11-24 MED ORDER — EPOETIN ALFA 40000 UNIT/ML IJ SOLN: 30000.0000 [IU] | INTRAMUSCULAR | Status: DC

## 2017-11-24 MED ORDER — EPOETIN ALFA 10000 UNIT/ML IJ SOLN
INTRAMUSCULAR | Status: AC
  Administered 2017-11-24: 10000 [IU]
  Filled 2017-11-24: qty 1

## 2017-11-24 MED ORDER — EPOETIN ALFA 20000 UNIT/ML IJ SOLN
INTRAMUSCULAR | Status: AC
  Administered 2017-11-24: 20000 [IU]
  Filled 2017-11-24: qty 1

## 2017-11-25 LAB — POCT HEMOGLOBIN-HEMACUE: HEMOGLOBIN: 8.1 g/dL — AB (ref 12.0–15.0)

## 2017-11-29 DIAGNOSIS — Z23 Encounter for immunization: Secondary | ICD-10-CM | POA: Diagnosis not present

## 2017-11-29 DIAGNOSIS — E1365 Other specified diabetes mellitus with hyperglycemia: Secondary | ICD-10-CM | POA: Diagnosis not present

## 2017-11-29 DIAGNOSIS — E1065 Type 1 diabetes mellitus with hyperglycemia: Secondary | ICD-10-CM | POA: Diagnosis not present

## 2017-12-01 ENCOUNTER — Encounter: Payer: Medicare Other | Attending: Physician Assistant | Admitting: Physician Assistant

## 2017-12-01 DIAGNOSIS — I10 Essential (primary) hypertension: Secondary | ICD-10-CM | POA: Insufficient documentation

## 2017-12-01 DIAGNOSIS — Z940 Kidney transplant status: Secondary | ICD-10-CM | POA: Diagnosis not present

## 2017-12-01 DIAGNOSIS — D649 Anemia, unspecified: Secondary | ICD-10-CM | POA: Insufficient documentation

## 2017-12-01 DIAGNOSIS — L97522 Non-pressure chronic ulcer of other part of left foot with fat layer exposed: Secondary | ICD-10-CM | POA: Diagnosis not present

## 2017-12-01 DIAGNOSIS — Z888 Allergy status to other drugs, medicaments and biological substances status: Secondary | ICD-10-CM | POA: Diagnosis not present

## 2017-12-01 DIAGNOSIS — Z8249 Family history of ischemic heart disease and other diseases of the circulatory system: Secondary | ICD-10-CM | POA: Diagnosis not present

## 2017-12-01 DIAGNOSIS — Z881 Allergy status to other antibiotic agents status: Secondary | ICD-10-CM | POA: Diagnosis not present

## 2017-12-01 DIAGNOSIS — Z833 Family history of diabetes mellitus: Secondary | ICD-10-CM | POA: Insufficient documentation

## 2017-12-01 DIAGNOSIS — L97822 Non-pressure chronic ulcer of other part of left lower leg with fat layer exposed: Secondary | ICD-10-CM | POA: Insufficient documentation

## 2017-12-01 DIAGNOSIS — G40909 Epilepsy, unspecified, not intractable, without status epilepticus: Secondary | ICD-10-CM | POA: Insufficient documentation

## 2017-12-01 DIAGNOSIS — Z91018 Allergy to other foods: Secondary | ICD-10-CM | POA: Insufficient documentation

## 2017-12-01 DIAGNOSIS — E1140 Type 2 diabetes mellitus with diabetic neuropathy, unspecified: Secondary | ICD-10-CM | POA: Diagnosis not present

## 2017-12-01 DIAGNOSIS — E11621 Type 2 diabetes mellitus with foot ulcer: Secondary | ICD-10-CM | POA: Diagnosis not present

## 2017-12-01 DIAGNOSIS — M1990 Unspecified osteoarthritis, unspecified site: Secondary | ICD-10-CM | POA: Diagnosis not present

## 2017-12-01 DIAGNOSIS — Z880 Allergy status to penicillin: Secondary | ICD-10-CM | POA: Insufficient documentation

## 2017-12-02 DIAGNOSIS — Z940 Kidney transplant status: Secondary | ICD-10-CM | POA: Diagnosis not present

## 2017-12-02 DIAGNOSIS — B9789 Other viral agents as the cause of diseases classified elsewhere: Secondary | ICD-10-CM | POA: Diagnosis not present

## 2017-12-02 DIAGNOSIS — Z79899 Other long term (current) drug therapy: Secondary | ICD-10-CM | POA: Diagnosis not present

## 2017-12-02 DIAGNOSIS — N184 Chronic kidney disease, stage 4 (severe): Secondary | ICD-10-CM | POA: Diagnosis not present

## 2017-12-02 DIAGNOSIS — N189 Chronic kidney disease, unspecified: Secondary | ICD-10-CM | POA: Diagnosis not present

## 2017-12-03 NOTE — Progress Notes (Signed)
ROIZA, WIEDEL ([id number]) Visit Report for 12/01/2017 Allergy List Details Patient Name: [name], [name] Date of Service: 12/01/2017 1:00 PM Medical Record Number: [id number] Patient Account Number: [id number] Date of Birth/Sex: 30-Aug-1985 (32 y.o. F) Treating RN: Secundino Ginger Primary Care Jernee Murtaugh: [name] Other Clinician: Referring Franshesca Chipman: [name] Treating Gentle Hoge/Extender: STONE III, HOYT Weeks in Treatment: 0 Allergies Active Allergies Shellfish Containing Products Benadryl Reaction: anaphalaxis Severity: Severe banana nitrofurantoin Infed penicillin doxycycline Allergy Notes Electronic Signature(s) Signed: 12/01/2017 1:21:42 PM By: Secundino Ginger Entered By: Secundino Ginger on 12/01/2017 13:02:52 [name] ([id number]) -------------------------------------------------------------------------------- Arrival Information Details Patient Name: [name] Date of Service: 12/01/2017 1:00 PM Medical Record Number: [id number] Patient Account Number: [id number] Date of Birth/Sex: [birth year] (32 y.o. F) Treating RN: Cornell Barman Primary Care Nello Corro: [name] Other Clinician: Referring Jalonda Antigua: [name] Treating Deshunda Thackston/Extender: Melburn Hake, HOYT Weeks in Treatment: 0 Visit Information Patient Arrived: Ambulatory Arrival Time: 12:54 Accompanied By: self Transfer Assistance: None Patient Identification Verified: Yes Secondary Verification Process Yes Completed: Patient Has Alerts: Yes Patient Alerts: ABI 02/14/17 L 1.07 R 1.11 History Since Last Visit Added or deleted any medications: No Any new allergies or adverse reactions: No Had a fall or experienced change in activities of daily living that may affect risk of falls: No Signs or symptoms of abuse/neglect since last visito No Hospitalized since last visit: No Implantable device outside of the clinic excluding cellular tissue based products placed in the center since last visit: No Electronic  Signature(s) Signed: 12/01/2017 1:15:22 PM By: Gretta Cool, BSN, RN, CWS, Kim RN, BSN Entered By: Gretta Cool, BSN, RN, CWS, Kim on 12/01/2017 13:15:22 [name] ([id number]) -------------------------------------------------------------------------------- Clinic Level of Care Assessment Details Patient Name: [name] Date of Service: 12/01/2017 1:00 PM Medical Record Number: [id number] Patient Account Number: [id number] Date of Birth/Sex: 02-Feb-1986 (32 y.o. F) Treating RN: Cornell Barman Primary Care Damany Eastman: [name] Other Clinician: Referring Rosalynd Mcwright: [name] Treating Ladene Allocca/Extender: Melburn Hake, HOYT Weeks in Treatment: 0 Clinic Level of Care Assessment Items TOOL 1 Quantity Score []  - Use when EandM and Procedure is performed on INITIAL visit 0 ASSESSMENTS - Nursing Assessment / Reassessment X - General Physical Exam (combine w/ comprehensive assessment (listed just below) when 1 20 performed on new pt. evals) X- 1 25 Comprehensive Assessment (HX, ROS, Risk Assessments, Wounds Hx, etc.) ASSESSMENTS - Wound and Skin Assessment / Reassessment []  - Dermatologic / Skin Assessment (not related to wound area) 0 ASSESSMENTS - Ostomy and/or Continence Assessment and Care []  - Incontinence Assessment and Management 0 []  - 0 Ostomy Care Assessment and Management (repouching, etc.) PROCESS - Coordination of Care X - Simple Patient / Family Education for ongoing care 1 15 []  - 0 Complex (extensive) Patient / Family Education for ongoing care []  - 0 Staff obtains Programmer, systems, Records, Test Results / Process Orders []  - 0 Staff telephones HHA, Nursing Homes / Clarify orders / etc []  - 0 Routine Transfer to another Facility (non-emergent condition) []  - 0 Routine Hospital Admission (non-emergent condition) X- 1 15 New Admissions / Biomedical engineer / Ordering NPWT, Apligraf, etc. []  - 0 Emergency Hospital Admission (emergent condition) PROCESS - Special Needs []  -  Pediatric / Minor Patient Management 0 []  - 0 Isolation Patient Management []  - 0 Hearing / Language / Visual special needs []  - 0 Assessment of Community assistance (transportation, D/C planning, etc.) []  - 0 Additional assistance / Altered mentation []  - 0 Support Surface(s) Assessment (bed, cushion, seat, etc.) Bartley,  Julianne Handler ([id number]) INTERVENTIONS - Miscellaneous []  - External ear exam 0 []  - 0 Patient Transfer (multiple staff / Harrel Lemon Lift / Similar devices) []  - 0 Simple Staple / Suture removal (25 or less) []  - 0 Complex Staple / Suture removal (26 or more) []  - 0 Hypo/Hyperglycemic Management (do not check if billed separately) []  - 0 Ankle / Brachial Index (ABI) - do not check if billed separately Has the patient been seen at the hospital within the last three years: Yes Total Score: 75 Level Of Care: New/Established - Level 2 Electronic Signature(s) Signed: 12/01/2017 5:07:26 PM By: Gretta Cool, BSN, RN, CWS, Kim RN, BSN Entered By: Gretta Cool, BSN, RN, CWS, Kim on 12/01/2017 13:33:47 [name] ([id number]) -------------------------------------------------------------------------------- Lower Extremity Assessment Details Patient Name: [name] Date of Service: 12/01/2017 1:00 PM Medical Record Number: [id number] Patient Account Number: [id number] Date of Birth/Sex: [birth year] (32 y.o. F) Treating RN: Secundino Ginger Primary Care Tymir Terral: [name] Other Clinician: Referring Kellen Dutch: [name] Treating Latrecia Capito/Extender: Melburn Hake, HOYT Weeks in Treatment: 0 Edema Assessment Assessed: [Left: No] [Right: No] [Left: Edema] [Right: :] Calf Left: Right: Point of Measurement: 35 cm From Medial Instep 36 cm 34.5 cm Ankle Left: Right: Point of Measurement: 11 cm From Medial Instep 20.5 cm 19.5 cm Vascular Assessment Pulses: Dorsalis Pedis Palpable: [Left:Yes] [Right:Yes] Doppler Audible: [Left:Yes] [Right:Yes] Posterior Tibial Palpable: [Left:Yes]  [Right:Yes] Doppler Audible: [Left:Yes] [Right:Yes] Extremity colors, hair growth, and conditions: Extremity Color: [Left:Normal] [Right:Normal] Hair Growth on Extremity: [Left:No] [Right:No] Temperature of Extremity: [Left:Warm] [Right:Warm] Capillary Refill: [Left:< 3 seconds] [Right:< 3 seconds] Toe Nail Assessment Left: Right: Thick: Yes Yes Discolored: Yes Yes Deformed: No No Improper Length and Hygiene: No No Electronic Signature(s) Signed: 12/01/2017 1:21:42 PM By: Secundino Ginger Entered By: Secundino Ginger on 12/01/2017 13:16:13 [name] ([id number]) -------------------------------------------------------------------------------- Multi Wound Chart Details Patient Name: [name] Date of Service: 12/01/2017 1:00 PM Medical Record Number: [id number] Patient Account Number: [id number] Date of Birth/Sex: [birth year] (32 y.o. F) Treating RN: Cornell Barman Primary Care Tarren Sabree: [name] Other Clinician: Referring Skippy Marhefka: [name] Treating Sejal Cofield/Extender: Melburn Hake, HOYT Weeks in Treatment: 0 Vital Signs Height(in): Pulse(bpm): 61 Weight(lbs): Blood Pressure(mmHg): 146/98 Body Mass Index(BMI): Temperature(F): 98.2 Respiratory Rate 18 (breaths/min): Photos: Wound Location: Left Toe Great Left Toe Second - Medial Left Toe Second - Distal Wounding Event: Gradually Appeared Gradually Appeared Gradually Appeared Primary Etiology: Diabetic Wound/Ulcer of the Diabetic Wound/Ulcer of the Diabetic Wound/Ulcer of the Lower Extremity Lower Extremity Lower Extremity Comorbid History: Anemia, Hypertension, Type II Anemia, Hypertension, Type II Anemia, Hypertension, Type II Diabetes, Osteoarthritis, Diabetes, Osteoarthritis, Diabetes, Osteoarthritis, Neuropathy, Seizure Disorder Neuropathy, Seizure Disorder Neuropathy, Seizure Disorder Date Acquired: 11/07/2017 11/07/2017 11/07/2017 Weeks of Treatment: 0 0 0 Wound Status: Open Open Open Pending Amputation on Yes Yes  Yes Presentation: Measurements L x W x D 1.5x2.3x0.3 1x0.4x0.2 0.1x0.1x0.1 (cm) Area (cm) : 2.71 0.314 0.008 Volume (cm) : 0.813 0.063 0.001 Classification: Grade 1 Grade 1 Grade 1 Exudate Amount: Medium Medium None Present Exudate Type: Serous Serous N/A Exudate Color: amber amber N/A Wound Margin: Flat and Intact Flat and Intact Flat and Intact Granulation Amount: Large (67-100%) Large (67-100%) None Present (0%) Granulation Quality: Red Red N/A Necrotic Amount: Small (1-33%) Small (1-33%) Large (67-100%) Necrotic Tissue: Adherent Summit Eschar Exposed Structures: Fat Layer (Subcutaneous Fat Layer (Subcutaneous Fascia: No Tissue) Exposed: Yes Tissue) Exposed: Yes Fat Layer (Subcutaneous Fascia: No Fascia: No Tissue) Exposed: No Tendon: No Tendon: No Tendon: No Bour,  Mathea Chauncey Cruel ([id number]) Muscle: No Muscle: No Muscle: No Joint: No Joint: No Joint: No Bone: No Bone: No Bone: No Epithelialization: None None None Periwound Skin Texture: Excoriation: No Excoriation: No Excoriation: No Induration: No Induration: No Induration: No Callus: No Callus: No Callus: No Crepitus: No Crepitus: No Crepitus: No Rash: No Rash: No Rash: No Scarring: No Scarring: No Scarring: No Periwound Skin Moisture: Maceration: No Maceration: No Maceration: No Dry/Scaly: No Dry/Scaly: No Dry/Scaly: No Periwound Skin Color: Atrophie Blanche: No Atrophie Blanche: No Atrophie Blanche: No Cyanosis: No Cyanosis: No Cyanosis: No Ecchymosis: No Ecchymosis: No Ecchymosis: No Erythema: No Erythema: No Erythema: No Hemosiderin Staining: No Hemosiderin Staining: No Hemosiderin Staining: No Mottled: No Mottled: No Mottled: No Pallor: No Pallor: No Pallor: No Rubor: No Rubor: No Rubor: No Temperature: N/A No Abnormality No Abnormality Tenderness on Palpation: No No No Wound Preparation: Ulcer Cleansing: Ulcer Cleansing: Ulcer  Cleansing: Rinsed/Irrigated with Saline Rinsed/Irrigated with Saline Rinsed/Irrigated with Saline Topical Anesthetic Applied: Topical Anesthetic Applied: Topical Anesthetic Applied: Other: lidocaine 4% Other: lidocaine 4% Other: lidocaine 4% Wound Number: 7 N/A N/A Photos: N/A N/A Wound Location: Left Toe Third N/A N/A Wounding Event: Gradually Appeared N/A N/A Primary Etiology: Diabetic Wound/Ulcer of the N/A N/A Lower Extremity Comorbid History: Anemia, Hypertension, Type II N/A N/A Diabetes, Osteoarthritis, Neuropathy, Seizure Disorder Date Acquired: 11/07/2017 N/A N/A Weeks of Treatment: 0 N/A N/A Wound Status: Open N/A N/A Pending Amputation on No N/A N/A Presentation: Measurements L x W x D 0.2x0.2x0.2 N/A N/A (cm) Area (cm) : 0.031 N/A N/A Volume (cm) : 0.006 N/A N/A Classification: Grade 1 N/A N/A Exudate Amount: Medium N/A N/A Exudate Type: Serous N/A N/A Exudate Color: amber N/A N/A Wound Margin: Flat and Intact N/A N/A FELICIE, KOCHER ([id number]) Granulation Amount: Large (67-100%) N/A N/A Granulation Quality: Red N/A N/A Necrotic Amount: Small (1-33%) N/A N/A Necrotic Tissue: Adherent Slough N/A N/A Exposed Structures: Fat Layer (Subcutaneous N/A N/A Tissue) Exposed: Yes Fascia: No Tendon: No Muscle: No Joint: No Bone: No Epithelialization: None N/A N/A Periwound Skin Texture: Excoriation: No N/A N/A Induration: No Callus: No Crepitus: No Rash: No Scarring: No Periwound Skin Moisture: Maceration: No N/A N/A Dry/Scaly: No Periwound Skin Color: Atrophie Blanche: No N/A N/A Cyanosis: No Ecchymosis: No Erythema: No Hemosiderin Staining: No Mottled: No Pallor: No Rubor: No Temperature: No Abnormality N/A N/A Tenderness on Palpation: No N/A N/A Wound Preparation: Ulcer Cleansing: N/A N/A Rinsed/Irrigated with Saline Topical Anesthetic Applied: Other: lidocaine 4% Treatment Notes Electronic Signature(s) Signed: 12/01/2017 5:07:26 PM By:  Gretta Cool, BSN, RN, CWS, Kim RN, BSN Entered By: Gretta Cool, BSN, RN, CWS, Kim on 12/01/2017 13:24:22 [name] ([id number]) -------------------------------------------------------------------------------- Multi-Disciplinary Care Plan Details Patient Name: [name] Date of Service: 12/01/2017 1:00 PM Medical Record Number: [id number] Patient Account Number: [id number] Date of Birth/Sex: 1985-04-15 (32 y.o. F) Treating RN: Cornell Barman Primary Care Nimra Puccinelli: [name] Other Clinician: Referring Chessie Neuharth: [name] Treating Ryleigh Esqueda/Extender: Melburn Hake, HOYT Weeks in Treatment: 0 Active Inactive ` Necrotic Tissue Nursing Diagnoses: Impaired tissue integrity related to necrotic/devitalized tissue Goals: Necrotic/devitalized tissue will be minimized in the wound bed Date Initiated: 12/01/2017 Target Resolution Date: 12/16/2017 Goal Status: Active Interventions: Assess patient pain level pre-, during and post procedure and prior to discharge Treatment Activities: Apply topical anesthetic as ordered : 12/01/2017 Notes: ` Orientation to the Wound Care Program Nursing Diagnoses: Knowledge deficit related to the wound healing center program Goals: Patient/caregiver will verbalize understanding of the [place] Program Date  Initiated: 12/01/2017 Target Resolution Date: 12/31/2017 Goal Status: Active Interventions: Provide education on orientation to the wound center Notes: ` Wound/Skin Impairment Nursing Diagnoses: Impaired tissue integrity Goals: Patient/caregiver will verbalize understanding of skin care regimen Date Initiated: 12/01/2017 Target Resolution Date: 12/31/2017 KEMONIE, CUTILLO ([id number]) Goal Status: Active Interventions: Assess ulceration(s) every visit Notes: Electronic Signature(s) Signed: 12/01/2017 5:07:26 PM By: Gretta Cool, BSN, RN, CWS, Kim RN, BSN Entered By: Gretta Cool, BSN, RN, CWS, Kim on 12/01/2017 13:24:02 [name]  ([id number]) -------------------------------------------------------------------------------- Pain Assessment Details Patient Name: [name] Date of Service: 12/01/2017 1:00 PM Medical Record Number: [id number] Patient Account Number: [id number] Date of Birth/Sex: 1985-11-24 (32 y.o. F) Treating RN: Cornell Barman Primary Care Kenya Kook: [name] Other Clinician: Referring Araly Kaas: [name] Treating Sherman Lipuma/Extender: Melburn Hake, HOYT Weeks in Treatment: 0 Active Problems Location of Pain Severity and Description of Pain Patient Has Paino Yes Site Locations Rate the pain. Current Pain Level: 1 Worst Pain Level: 8 Pain Management and Medication Current Pain Management: Electronic Signature(s) Signed: 12/01/2017 2:51:13 PM By: Lorine Bears RCP, RRT, CHT Signed: 12/01/2017 5:07:26 PM By: Gretta Cool, BSN, RN, CWS, Kim RN, BSN Entered By: Lorine Bears on 12/01/2017 12:56:01 [name] ([id number]) -------------------------------------------------------------------------------- Wound Assessment Details Patient Name: [name] Date of Service: 12/01/2017 1:00 PM Medical Record Number: [id number] Patient Account Number: [id number] Date of Birth/Sex: May 30, 1985 (32 y.o. F) Treating RN: Secundino Ginger Primary Care Shaena Parkerson: [name] Other Clinician: Referring Stevin Bielinski: [name] Treating Humza Tallerico/Extender: STONE III, HOYT Weeks in Treatment: 0 Wound Status Wound Number: 4 Primary Diabetic Wound/Ulcer of the Lower Extremity Etiology: Wound Location: Left Toe Great Wound Open Wounding Event: Gradually Appeared Status: Date Acquired: 11/07/2017 Comorbid Anemia, Hypertension, Type II Diabetes, Weeks Of Treatment: 0 History: Osteoarthritis, Neuropathy, Seizure Disorder Clustered Wound: No Pending Amputation On Presentation Photos Photo Uploaded By: Secundino Ginger on 12/01/2017 13:17:44 Wound Measurements Length: (cm) 1.5 %  Reduction Width: (cm) 2.3 % Reduction Depth: (cm) 0.3 Epitheliali Area: (cm) 2.71 Tunneling: Volume: (cm) 0.813 Underminin in Area: in Volume: zation: None No g: No Wound Description Classification: Grade 1 Foul Odor Wound Margin: Flat and Intact Slough/Fib Exudate Amount: Medium Exudate Type: Serous Exudate Color: amber After Cleansing: No rino Yes Wound Bed Granulation Amount: Large (67-100%) Exposed Structure Granulation Quality: Red Fascia Exposed: No Necrotic Amount: Small (1-33%) Fat Layer (Subcutaneous Tissue) Exposed: Yes Necrotic Quality: Adherent Slough Tendon Exposed: No Muscle Exposed: No Joint Exposed: No Bone Exposed: No Periwound Skin Texture WENDELIN, READER. ([id number]) Texture Color No Abnormalities Noted: No No Abnormalities Noted: No Callus: No Atrophie Blanche: No Crepitus: No Cyanosis: No Excoriation: No Ecchymosis: No Induration: No Erythema: No Rash: No Hemosiderin Staining: No Scarring: No Mottled: No Pallor: No Moisture Rubor: No No Abnormalities Noted: No Dry / Scaly: No Maceration: No Wound Preparation Ulcer Cleansing: Rinsed/Irrigated with Saline Topical Anesthetic Applied: Other: lidocaine 4%, Electronic Signature(s) Signed: 12/01/2017 1:21:42 PM By: Secundino Ginger Entered By: Secundino Ginger on 12/01/2017 13:11:03 [name] ([id number]) -------------------------------------------------------------------------------- Wound Assessment Details Patient Name: [name] Date of Service: 12/01/2017 1:00 PM Medical Record Number: [id number] Patient Account Number: [id number] Date of Birth/Sex: [birth year] (32 y.o. F) Treating RN: Secundino Ginger Primary Care Komal Stangelo: [name] Other Clinician: Referring Jomel Whittlesey: [name] Treating Keifer Habib/Extender: STONE III, HOYT Weeks in Treatment: 0 Wound Status Wound Number: 5 Primary Diabetic Wound/Ulcer of the Lower Extremity Etiology: Wound Location: Left Toe Second -  Medial Wound Open Wounding Event: Gradually Appeared Status: Date Acquired: 11/07/2017  Comorbid Anemia, Hypertension, Type II Diabetes, Weeks Of Treatment: 0 History: Osteoarthritis, Neuropathy, Seizure Disorder Clustered Wound: No Photos Photo Uploaded By: Secundino Ginger on 12/01/2017 13:17:45 Wound Measurements Length: (cm) 1 % Reduction Width: (cm) 0.4 % Reduction Depth: (cm) 0.2 Epitheliali Area: (cm) 0.314 Tunneling: Volume: (cm) 0.063 Underminin in Area: in Volume: zation: None No g: No Wound Description Classification: Grade 1 Foul Odor Wound Margin: Flat and Intact Slough/Fib Exudate Amount: Medium Exudate Type: Serous Exudate Color: amber After Cleansing: No rino Yes Wound Bed Granulation Amount: Large (67-100%) Exposed Structure Granulation Quality: Red Fascia Exposed: No Necrotic Amount: Small (1-33%) Fat Layer (Subcutaneous Tissue) Exposed: Yes Necrotic Quality: Adherent Slough Tendon Exposed: No Muscle Exposed: No Joint Exposed: No Bone Exposed: No Periwound Skin Texture NORA, SABEY. ([id number]) Texture Color No Abnormalities Noted: No No Abnormalities Noted: No Callus: No Atrophie Blanche: No Crepitus: No Cyanosis: No Excoriation: No Ecchymosis: No Induration: No Erythema: No Rash: No Hemosiderin Staining: No Scarring: No Mottled: No Pallor: No Moisture Rubor: No No Abnormalities Noted: No Dry / Scaly: No Temperature / Pain Maceration: No Temperature: No Abnormality Wound Preparation Ulcer Cleansing: Rinsed/Irrigated with Saline Topical Anesthetic Applied: Other: lidocaine 4%, Electronic Signature(s) Signed: 12/01/2017 1:21:42 PM By: Secundino Ginger Entered By: Secundino Ginger on 12/01/2017 13:12:11 [name] ([id number]) -------------------------------------------------------------------------------- Wound Assessment Details Patient Name: [name] Date of Service: 12/01/2017 1:00 PM Medical Record Number: [id number] Patient  Account Number: [id number] Date of Birth/Sex: [birth year] (32 y.o. F) Treating RN: Secundino Ginger Primary Care Larone Kliethermes: [name] Other Clinician: Referring Collins Kerby: [name] Treating Aadam Zhen/Extender: STONE III, HOYT Weeks in Treatment: 0 Wound Status Wound Number: 6 Primary Diabetic Wound/Ulcer of the Lower Extremity Etiology: Wound Location: Left Toe Second - Distal Wound Open Wounding Event: Gradually Appeared Status: Date Acquired: 11/07/2017 Comorbid Anemia, Hypertension, Type II Diabetes, Weeks Of Treatment: 0 History: Osteoarthritis, Neuropathy, Seizure Disorder Clustered Wound: No Pending Amputation On Presentation Photos Photo Uploaded By: Secundino Ginger on 12/01/2017 13:20:34 Wound Measurements Length: (cm) 0.1 % Reduction Width: (cm) 0.1 % Reduction Depth: (cm) 0.1 Epitheliali Area: (cm) 0.008 Tunneling: Volume: (cm) 0.001 Underminin in Area: in Volume: zation: None No g: No Wound Description Classification: Grade 1 Foul Odor Wound Margin: Flat and Intact Slough/Fib Exudate Amount: None Present After Cleansing: No rino No Wound Bed Granulation Amount: None Present (0%) Exposed Structure Necrotic Amount: Large (67-100%) Fascia Exposed: No Necrotic Quality: Eschar Fat Layer (Subcutaneous Tissue) Exposed: No Tendon Exposed: No Muscle Exposed: No Joint Exposed: No Bone Exposed: No Periwound Skin Texture Texture Color RENESHIA, ZUCCARO. ([id number]) No Abnormalities Noted: No No Abnormalities Noted: No Callus: No Atrophie Blanche: No Crepitus: No Cyanosis: No Excoriation: No Ecchymosis: No Induration: No Erythema: No Rash: No Hemosiderin Staining: No Scarring: No Mottled: No Pallor: No Moisture Rubor: No No Abnormalities Noted: No Dry / Scaly: No Temperature / Pain Maceration: No Temperature: No Abnormality Wound Preparation Ulcer Cleansing: Rinsed/Irrigated with Saline Topical Anesthetic Applied: Other: lidocaine 4%, Electronic  Signature(s) Signed: 12/01/2017 1:21:42 PM By: Secundino Ginger Entered By: Secundino Ginger on 12/01/2017 13:13:14 [name] ([id number]) -------------------------------------------------------------------------------- Wound Assessment Details Patient Name: [name] Date of Service: 12/01/2017 1:00 PM Medical Record Number: [id number] Patient Account Number: [id number] Date of Birth/Sex: 1985/11/01 (32 y.o. F) Treating RN: Secundino Ginger Primary Care Jr Milliron: [name] Other Clinician: Referring Brelynn Wheller: [name] Treating Donyel Castagnola/Extender: STONE III, HOYT Weeks in Treatment: 0 Wound Status Wound Number: 7 Primary Diabetic Wound/Ulcer of the Lower Extremity Etiology: Wound Location: Left  Toe Third Wound Open Wounding Event: Gradually Appeared Status: Date Acquired: 11/07/2017 Comorbid Anemia, Hypertension, Type II Diabetes, Weeks Of Treatment: 0 History: Osteoarthritis, Neuropathy, Seizure Disorder Clustered Wound: No Photos Photo Uploaded By: Secundino Ginger on 12/01/2017 13:20:34 Wound Measurements Length: (cm) 0.2 % Reduction Width: (cm) 0.2 % Reduction Depth: (cm) 0.2 Epitheliali Area: (cm) 0.031 Tunneling: Volume: (cm) 0.006 Underminin in Area: in Volume: zation: None No g: No Wound Description Classification: Grade 1 Foul Odor Wound Margin: Flat and Intact Slough/Fib Exudate Amount: Medium Exudate Type: Serous Exudate Color: amber After Cleansing: No rino Yes Wound Bed Granulation Amount: Large (67-100%) Exposed Structure Granulation Quality: Red Fascia Exposed: No Necrotic Amount: Small (1-33%) Fat Layer (Subcutaneous Tissue) Exposed: Yes Necrotic Quality: Adherent Slough Tendon Exposed: No Muscle Exposed: No Joint Exposed: No Bone Exposed: No Periwound Skin Texture ROCHEL, PRIVETT. ([id number]) Texture Color No Abnormalities Noted: No No Abnormalities Noted: No Callus: No Atrophie Blanche: No Crepitus: No Cyanosis: No Excoriation:  No Ecchymosis: No Induration: No Erythema: No Rash: No Hemosiderin Staining: No Scarring: No Mottled: No Pallor: No Moisture Rubor: No No Abnormalities Noted: No Dry / Scaly: No Temperature / Pain Maceration: No Temperature: No Abnormality Wound Preparation Ulcer Cleansing: Rinsed/Irrigated with Saline Topical Anesthetic Applied: Other: lidocaine 4%, Electronic Signature(s) Signed: 12/01/2017 1:21:42 PM By: Secundino Ginger Entered By: Secundino Ginger on 12/01/2017 13:14:34 [name] ([id number]) -------------------------------------------------------------------------------- Vitals Details Patient Name: [name] Date of Service: 12/01/2017 1:00 PM Medical Record Number: [id number] Patient Account Number: [id number] Date of Birth/Sex: 10-21-1985 (32 y.o. F) Treating RN: Cornell Barman Primary Care Tynetta Bachmann: [name] Other Clinician: Referring Sargun Rummell: [name] Treating Tavita Eastham/Extender: STONE III, HOYT Weeks in Treatment: 0 Vital Signs Time Taken: 12:55 Temperature (F): 98.2 Pulse (bpm): 96 Respiratory Rate (breaths/min): 18 Blood Pressure (mmHg): 146/98 Reference Range: 80 - 120 mg / dl Electronic Signature(s) Signed: 12/01/2017 2:51:13 PM By: Lorine Bears RCP, RRT, CHT Entered By: Lorine Bears on 12/01/2017 12:59:46

## 2017-12-03 NOTE — Progress Notes (Addendum)
AWA, BACHICHA ([id number]) Visit Report for 12/01/2017 Chief Complaint Document Details Patient Name: [name], [name] Date of Service: 12/01/2017 1:00 PM Medical Record Number: [id number] Patient Account Number: [id number] Date of Birth/Sex: December 23, 1985 (32 y.o. F) Treating RN: Cornell Barman Primary Care Provider: [name] Other Clinician: Referring Provider: [name] Treating Provider/Extender: Melburn Hake, Susanna Benge Weeks in Treatment: 0 Information Obtained from: Patient Chief Complaint She is here in follow evaluation of left foot ulcers Electronic Signature(s) Signed: 12/01/2017 4:52:54 PM By: Worthy [name] PA-C Entered By: Worthy [name] on 12/01/2017 13:21:04 [name] ([id number]) -------------------------------------------------------------------------------- Debridement Details Patient Name: [name] Date of Service: 12/01/2017 1:00 PM Medical Record Number: [id number] Patient Account Number: [id number] Date of Birth/Sex: May 27, 1985 (32 y.o. F) Treating RN: Cornell Barman Primary Care Provider: [name] Other Clinician: Referring Provider: [name] Treating Provider/Extender: Melburn Hake, Tynslee Bowlds Weeks in Treatment: 0 Debridement Performed for Wound #4 Left Toe Great Assessment: Performed By: Physician STONE III, Kayzlee Wirtanen E., PA-C Debridement Type: Debridement Severity of Tissue Pre Fat layer exposed Debridement: Level of Consciousness (Pre- Awake and Alert procedure): Pre-procedure Verification/Time No Out Taken: Start Time: 13:25 Pain Control: Other : lidocaine 4% Total Area Debrided (L x W): 1.5 (cm) x 2.3 (cm) = 3.45 (cm) Tissue and other material Viable, Non-Viable, Slough, Subcutaneous, Slough debrided: Level: Skin/Subcutaneous Tissue Debridement Description: Excisional Instrument: Curette Bleeding: Minimum Hemostasis Achieved: Pressure End Time: 13:28 Response to Treatment: Procedure was tolerated well Level of Consciousness Awake  and Alert (Post-procedure): Post Debridement Measurements of Total Wound Length: (cm) 1.5 Width: (cm) 2.3 Depth: (cm) 0.4 Volume: (cm) 1.084 Character of Wound/Ulcer Post Debridement: Requires Further Debridement Severity of Tissue Post Debridement: Fat layer exposed Post Procedure Diagnosis Same as Pre-procedure Electronic Signature(s) Signed: 12/01/2017 4:52:54 PM By: Worthy [name] PA-C Signed: 12/01/2017 5:07:26 PM By: Gretta Cool, BSN, RN, CWS, Kim RN, BSN Entered By: Gretta Cool, BSN, RN, CWS, Kim on 12/01/2017 13:27:12 [name] ([id number]) -------------------------------------------------------------------------------- Debridement Details Patient Name: [name] Date of Service: 12/01/2017 1:00 PM Medical Record Number: [id number] Patient Account Number: [id number] Date of Birth/Sex: 1986-01-11 (32 y.o. F) Treating RN: Cornell Barman Primary Care Provider: [name] Other Clinician: Referring Provider: [name] Treating Provider/Extender: Melburn Hake, Dian Minahan Weeks in Treatment: 0 Debridement Performed for Wound #6 Left,Distal Toe Second Assessment: Performed By: Physician STONE III, Eleftheria Taborn E., PA-C Debridement Type: Debridement Severity of Tissue Pre Fat layer exposed Debridement: Level of Consciousness (Pre- Awake and Alert procedure): Pre-procedure Verification/Time No Out Taken: Start Time: 13:25 Pain Control: Other : lidocaine 4% Total Area Debrided (L x W): 0.3 (cm) x 0.2 (cm) = 0.06 (cm) Tissue and other material Viable, Non-Viable, Callus, Slough, Subcutaneous, Slough debrided: Level: Skin/Subcutaneous Tissue Debridement Description: Excisional Instrument: Curette Bleeding: Minimum Hemostasis Achieved: Pressure End Time: 13:28 Response to Treatment: Procedure was tolerated well Level of Consciousness Awake and Alert (Post-procedure): Post Debridement Measurements of Total Wound Length: (cm) 0.3 Width: (cm) 0.2 Depth: (cm) 0.3 Volume:  (cm) 0.014 Character of Wound/Ulcer Post Debridement: Requires Further Debridement Severity of Tissue Post Debridement: Fat layer exposed Post Procedure Diagnosis Same as Pre-procedure Electronic Signature(s) Signed: 12/01/2017 4:52:54 PM By: Worthy [name] PA-C Signed: 12/01/2017 5:07:26 PM By: Gretta Cool, BSN, RN, CWS, Kim RN, BSN Entered By: Gretta Cool, BSN, RN, CWS, Kim on 12/01/2017 13:28:59 [name] ([id number]) -------------------------------------------------------------------------------- Debridement Details Patient Name: [name] Date of Service: 12/01/2017 1:00 PM Medical Record Number: [id number] Patient Account Number: [id number] Date of Birth/Sex: 10-May-1985 (32 y.o.  F) Treating RN: Cornell Barman Primary Care Provider: [name] Other Clinician: Referring Provider: [name] Treating Provider/Extender: Melburn Hake, Maneh Sieben Weeks in Treatment: 0 Debridement Performed for Wound #5 Left,Medial Toe Second Assessment: Performed By: Physician STONE III, Naomi Castrogiovanni E., PA-C Debridement Type: Debridement Severity of Tissue Pre Fat layer exposed Debridement: Level of Consciousness (Pre- Awake and Alert procedure): Pre-procedure Verification/Time No Out Taken: Start Time: 13:25 Pain Control: Other : lidocaine 4% Total Area Debrided (L x W): 1 (cm) x 0.4 (cm) = 0.4 (cm) Tissue and other material Viable, Non-Viable, Slough, Subcutaneous, Slough debrided: Level: Skin/Subcutaneous Tissue Debridement Description: Excisional Instrument: Curette Bleeding: Minimum Hemostasis Achieved: Pressure End Time: 13:28 Response to Treatment: Procedure was tolerated well Level of Consciousness Awake and Alert (Post-procedure): Post Debridement Measurements of Total Wound Length: (cm) 1 Width: (cm) 0.4 Depth: (cm) 0.2 Volume: (cm) 0.063 Character of Wound/Ulcer Post Debridement: Requires Further Debridement Severity of Tissue Post Debridement: Fat layer exposed Post  Procedure Diagnosis Same as Pre-procedure Electronic Signature(s) Signed: 12/01/2017 4:52:54 PM By: Worthy [name] PA-C Signed: 12/01/2017 5:07:26 PM By: Gretta Cool, BSN, RN, CWS, Kim RN, BSN Entered By: Gretta Cool, BSN, RN, CWS, Kim on 12/01/2017 13:29:51 [name] ([id number]) -------------------------------------------------------------------------------- Debridement Details Patient Name: [name] Date of Service: 12/01/2017 1:00 PM Medical Record Number: [id number] Patient Account Number: [id number] Date of Birth/Sex: 1985-04-02 (32 y.o. F) Treating RN: Cornell Barman Primary Care Provider: [name] Other Clinician: Referring Provider: [name] Treating Provider/Extender: Melburn Hake, Jarret Torre Weeks in Treatment: 0 Debridement Performed for Wound #7 Left Toe Third Assessment: Performed By: Physician STONE III, Redell Bhandari E., PA-C Debridement Type: Debridement Severity of Tissue Pre Fat layer exposed Debridement: Level of Consciousness (Pre- Awake and Alert procedure): Pre-procedure Verification/Time No Out Taken: Start Time: 13:25 Pain Control: Other : lidocaine 4% Total Area Debrided (L x W): 0.2 (cm) x 0.2 (cm) = 0.04 (cm) Tissue and other material Viable, Non-Viable, Slough, Subcutaneous, Slough debrided: Level: Skin/Subcutaneous Tissue Debridement Description: Excisional Instrument: Curette Bleeding: Minimum Hemostasis Achieved: Pressure End Time: 13:28 Response to Treatment: Procedure was tolerated well Level of Consciousness Awake and Alert (Post-procedure): Post Debridement Measurements of Total Wound Length: (cm) 0.2 Width: (cm) 0.2 Depth: (cm) 0.3 Volume: (cm) 0.009 Character of Wound/Ulcer Post Debridement: Requires Further Debridement Severity of Tissue Post Debridement: Fat layer exposed Post Procedure Diagnosis Same as Pre-procedure Electronic Signature(s) Signed: 12/01/2017 4:52:54 PM By: Worthy [name] PA-C Signed: 12/01/2017 5:07:26 PM By:  Gretta Cool, BSN, RN, CWS, Kim RN, BSN Entered By: Gretta Cool, BSN, RN, CWS, Kim on 12/01/2017 13:30:46 [name] ([id number]) -------------------------------------------------------------------------------- HPI Details Patient Name: [name] Date of Service: 12/01/2017 1:00 PM Medical Record Number: [id number] Patient Account Number: [id number] Date of Birth/Sex: 02/03/1986 (32 y.o. F) Treating RN: Cornell Barman Primary Care Provider: [name] Other Clinician: Referring Provider: [name] Treating Provider/Extender: Melburn Hake, Junia Nygren Weeks in Treatment: 0 History of Present Illness HPI Description: 02/14/17 on evaluation today patient appears to be doing fairly well all things considered. She tells me that around the middle of November she was sleeping close to a space heater when she woke up and sustained a burn to her right first toe. She has a history of diabetes which is uncontrolled her last hemoglobin A1c with 11.5 on December 12, 2016. She is status post having had a kidney transplant and this was necessitated by apparently a high dose of antibiotics given to her as a child. Overall she has been tolerating the wound fairly well all  things considered she has been putting antibiotic ointment on the area but otherwise no other treatment. She did go to the ER twice the station left without being seen due to the long wait. She continues to have discomfort rated to be 3-4/10 which is worse with toucher cleansing of the wound. 02/24/2017 -- she has uncontrolled diabetes mellitus with hyperglycemia and her last hemoglobin A1c was 14%. I have asked her to be more careful and see her PCP regarding this. She is also not wearing bilateral compression stockings as recommended before. 03/18/17 on evaluation today patient appears to be doing very well and in fact her wound appears to be completely healed. She has been tolerating the dressing changes without complication. Fortunately she is  having no pain. *** 05/26/17 patient seen today for reevaluation concerning her right great toe ulcer. She has previously been evaluated by myself in January through the beginning of the year before subsequently being discharged. She was completely healed at that point. Unfortunately patient tells me that she's unsure of exactly what happened but this wound has reopened. Upon hearing her story it sounds as if she likely injured this utilizing a pumis stone that she uses to work on her calluses. At one point she even asked me if there was a different way that she could potentially work on all of the callous and dry skin on her foot other than utilizing the stone. With that being said her story at this point was that she felt that she may have burnt her foot specifically the great toe on a space heater that she keeps on the bed stand beside her bed. With that being said I do not think that's very likely the toes around and all the surrounding region does not appear to show any signs of thermal injury nor does this ulcer appear to really be thermal in nature. It very much looks more like a diabetic foot ulcer. Patient's most recent hemoglobin A1c was 11.2 that was in January 2019. She has not had this check since she tells me that her blood sugars run in the "300s". Otherwise not much has really changed since I saw her previously. 06/02/17- She is here in follow-up evaluation for right great toe ulcer. Plain film x-ray revealed evidence worrisome for osteomyelitis, will order MRI; we discussed these findings. She presents to the clinic with feelings of hypoglycemia, she was provided with an orange juice in her glucose was 83; despite this being a normal glucose level am not surprised she is having hypoglycemic symptoms. She tolerated debridement. We discussed the need for tight glycemic control (her a1c has been 11 in Nov and Jan), offloading/reduced trauma and compliance with medical treatment plan. A  consult for ID was placed 06/09/17-She is here in follow-up evaluation for right great toe ulcer. Her MRI is scheduled for tomorrow. The wound is significantly improved with granulation tissue encompassing most of the wound, small amount of nonviable tissue close to the nail with uneven coloration of the toenail itself, currently no lifting. She has an appointment with podiatry and endocrinology on 4/9; an appointment with Dr. Ola Spurr on 4/11. I prescribed Levaquin last week which she has not started. Due to the improvement of the wound with granulation tissue, I cultured the wound after debridement and we will hold off on initiating Levaquin. I will reach out to her nephrologist, Dr. Lorrene Reid regarding antibiotic selection. If the MRI is negative for osteomyelitis we will cancel her appointment with Dr. Ola Spurr. She states she  has been more diligent in maintaining better glycemic control with more levels being below 200 then over; she has been encouraged to maintain this for continued wound healing. 06/16/17-She is here in follow up evaluation for right great toe ulcer. 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 She does have an appointment with Dr. Ola Spurr on 4/11. Culture that was obtained last week grew oxacillin sensitive staph aureus, she was initiated on the Levaquin that was originally ordered on 3/21. She admits to taking her loading dose on Monday 4/1 and starting her 250 mg daily dose on 4/2. We will extend the Levaquin 250 mg daily through her appointment with Dr. Ola Spurr. There continues to be improvement in both measurements and appearance, we will transition from Santyl to [facility]. She states her glucose levels are consistently less than 200 with fewer times greater than 200. She will follow-up next week [name], [name] ([id number]) Readmission: 12/01/17 on evaluation today patient presents for reevaluation due to ulcers on the left foot. She tells me at this point though honestly  she is a poor historian but she is unsure when these all showed up. She's been tolerating the dressing changes without complication using just a in the ointment and a Band-Aid as needed. With that being said she did become somewhat concerned about these and therefore made the appointment to come in for further evaluation with [place]. Fortunately there is no evidence of infection at this time. 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 Currently all the wounds on the left. No fevers, chills, nausea, or vomiting noted at this time. Patientos last A1c was 8.15 October 2017. Electronic Signature(s) Signed: 12/01/2017 4:52:54 PM By: Worthy [name] PA-C Entered By: Worthy [name] on 12/01/2017 13:59:56 [name] ([id number]) -------------------------------------------------------------------------------- Physical Exam Details Patient Name: [name] Date of Service: 12/01/2017 1:00 PM Medical Record Number: [id number] Patient Account Number: [id number] Date of Birth/Sex: 07/28/1985 (32 y.o. F) Treating RN: Cornell Barman Primary Care Provider: [name] Other Clinician: Referring Provider: [name] Treating Provider/Extender: STONE III, Tylen Leverich Weeks in Treatment: 0 Constitutional patient is hypertensive.. pulse regular and within target range for patient.Marland Kitchen respirations regular, non-labored and within target range for patient.Marland Kitchen temperature within target range for patient.. Well-nourished and well-hydrated in no acute distress. Eyes conjunctiva clear no eyelid edema noted. pupils equal round and reactive to light and accommodation. Ears, Nose, Mouth, and Throat no gross abnormality of ear auricles or external auditory canals. normal hearing noted during conversation. mucus membranes moist. Respiratory normal breathing without difficulty. clear to auscultation bilaterally. Cardiovascular regular rate and rhythm with normal S1, S2. 2+ dorsalis pedis/posterior tibialis  pulses. no clubbing, cyanosis, significant edema, <3 sec cap refill. Gastrointestinal (GI) soft, non-tender, non-distended, +BS. no ventral hernia noted. Musculoskeletal normal gait and posture. no significant deformity or arthritic changes, no loss or range of motion, no clubbing. Psychiatric this patient is able to make decisions and demonstrates good insight into disease process. Alert and Oriented x 3. pleasant and cooperative. Notes On inspection today patient's wounds actually show signs of varying degrees of severity. I did sharply debride all wound locations today which the patient tolerated without complication. Post debridement the wound beds tend to look better in general. There's no evidence of infection at this point in time. She did not have any discomfort with debridement at this time. Electronic Signature(s) Signed: 12/01/2017 4:52:54 PM By: Worthy [name] PA-C Entered By: Worthy [name] on 12/01/2017 13:58:56 [name] ([id number]) -------------------------------------------------------------------------------- Physician Orders  Details Patient Name: JONELLE, BANN Date of Service: 12/01/2017 1:00 PM Medical Record Number: [id number] Patient Account Number: [id number] Date of Birth/Sex: [birth year] (32 y.o. 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 Wound #5 Left,Medial Toe Second o Clean wound with Normal Saline. Wound #6 Left,Distal Toe Second o Clean wound with  Normal Saline. Wound #7 Left Toe Third o Clean wound with Normal Saline. Anesthetic (add to Medication List) Wound #4 Left Toe Great o Topical Lidocaine 4% cream applied to wound bed prior to debridement (In Clinic Only). Wound #5 Left,Medial Toe Second o Topical Lidocaine 4% cream applied to wound bed prior to debridement (In Clinic Only). Wound #6 Left,Distal Toe Second o Topical Lidocaine 4% cream applied to wound bed prior to debridement (In Clinic Only). Wound #7 Left Toe Third o Topical Lidocaine 4% cream applied to wound bed prior to debridement (In Clinic Only). Primary Wound Dressing Wound #4 Left Toe Great o Santyl Ointment Wound #5 Left,Medial Toe Second o Silver Alginate Wound #6 Left,Distal Toe Second o Silver Alginate Wound #7 Left Toe Third [name], ZUCKERMAN. ([id number]) o Santyl Ointment Secondary Dressing Wound #4 Left Toe Great o Gauze and Kerlix/Conform Wound #5 Left,Medial Toe Second o Gauze and Kerlix/Conform Wound #6 Left,Distal Toe Second o Gauze and Kerlix/Conform Wound #7 Left Toe Third o Gauze and Kerlix/Conform Dressing Change Frequency Wound #4 Left Toe Great o Change dressing every day. Wound #5 Left,Medial Toe Second o Change dressing every other day. Wound #6 Left,Distal Toe Second o Change dressing every other day. Wound #7 Left Toe Third o Change dressing every day. Follow-up Appointments Wound #4 Left Toe Great o Return Appointment in 1 week. Wound #5 Left,Medial Toe Second o Return Appointment in 1 week. Wound #6 Left,Distal Toe Second o Return Appointment in 1 week. Wound #7 Left Toe Third o Return Appointment in 1 week. Edema Control Wound #4 Left Toe Great o Elevate legs to the level of the heart and pump ankles as often as possible Wound #5 Left,Medial Toe Second o Elevate legs to the level of the heart and pump ankles as often as possible Wound #6 Left,Distal Toe Second o  Elevate legs to the level of the heart and pump ankles as often as possible Wound #7 Left Toe Third o Elevate legs to the level of the heart and pump ankles as often as possible Off-Loading Difatta, Alsie S. ([id number]) Wound #4 Left Toe Great o Other: - do not let toes rub in shoes. Wound #5 Left,Medial Toe Second o Other: - do not let toes rub in shoes. Wound #6 Left,Distal Toe Second o Other: - do not let toes rub in shoes. Wound #7 Left Toe Third o Other: - do not let toes rub in shoes. Medications-please add to medication list. Wound #4 Left Toe Great o Santyl Enzymatic Ointment Wound #7 Left Toe Third o Santyl Enzymatic Ointment Patient Medications Allergies: Benadryl, Shellfish Containing Products, banana, nitrofurantoin, Infed, penicillin, doxycycline Notifications Medication Indication Start End Santyl 12/05/2017 DOSE topical 250 unit/gram ointment - ointment topical applied nickel thick to the wound  bed and then cover with a dressing as directed Electronic Signature(s) Signed: 12/05/2017 5:05:02 PM By: Worthy [name] PA-C Previous Signature: 12/01/2017 4:52:54 PM Version By: Worthy [name] PA-C Previous Signature: 12/01/2017 5:07:26 PM Version By: Gretta Cool BSN, RN, CWS, Kim RN, BSN Entered By: Worthy [name] on 12/05/2017 17:05:02 [name] ([id number]) -------------------------------------------------------------------------------- Problem List Details Patient Name: [name] Date of Service: 12/01/2017 1:00 PM Medical Record Number: [id number] Patient Account Number: [id number] Date of Birth/Sex: 1985/05/30 (32 y.o. F) Treating RN: Cornell Barman Primary Care Provider: [name] Other Clinician: Referring Provider: [name] Treating Provider/Extender: Melburn Hake, Khamil [name] Weeks in Treatment: 0 Active Problems ICD-10 Evaluated Encounter Code Description Active Date Today Diagnosis E11.621 Type 2 diabetes mellitus with foot ulcer 12/01/2017 No  Yes L97.522 Non-pressure chronic ulcer of other part of left foot with fat 12/01/2017 No Yes layer exposed Z94.0 Kidney transplant status 12/01/2017 No Yes I10 Essential (primary) hypertension 12/01/2017 No Yes Inactive Problems Resolved Problems Electronic Signature(s) Signed: 12/01/2017 4:52:54 PM By: Worthy [name] PA-C Entered By: Worthy [name] on 12/01/2017 14:02:37 [name] ([id number]) -------------------------------------------------------------------------------- Progress Note Details Patient Name: [name] Date of Service: 12/01/2017 1:00 PM Medical Record Number: [id number] Patient Account Number: [id number] Date of Birth/Sex: [birth year] (32 y.o. F) Treating RN: Cornell Barman Primary Care Provider: [name] Other Clinician: Referring Provider: [name] Treating Provider/Extender: Melburn Hake, Dierks Wach Weeks in Treatment: 0 Subjective Chief Complaint Information obtained from Patient She is here in follow evaluation of left foot ulcers History of Present Illness (HPI) 02/14/17 on evaluation today patient appears to be doing fairly well all things considered. She tells me that around the middle of November she was sleeping close to a space heater when she woke up and sustained a burn to her right first toe. She has a history of diabetes which is uncontrolled her last hemoglobin A1c with 11.5 on December 12, 2016. She is status post having had a kidney transplant and this was necessitated by apparently a high dose of antibiotics given to her as a child. Overall she has been tolerating the wound fairly well all things considered she has been putting antibiotic ointment on the area but otherwise no other treatment. She did go to the ER twice the station left without being seen due to the long wait. She continues to have discomfort rated to be 3-4/10 which is worse with toucher cleansing of the wound. 02/24/2017 -- she has uncontrolled diabetes mellitus with  hyperglycemia and her last hemoglobin A1c was 14%. I have asked her to be more careful and see her PCP regarding this. She is also not wearing bilateral compression stockings as recommended before. 03/18/17 on evaluation today patient appears to be doing very well and in fact her wound appears to be completely healed. She has been tolerating the dressing changes without complication. Fortunately she is having no pain. *** 05/26/17 patient seen today for reevaluation concerning her right great toe ulcer. She has previously been evaluated by myself in January through the beginning of the year before subsequently being discharged. She was completely healed at that point. Unfortunately patient tells me that she's unsure of exactly what happened but this wound has reopened. Upon hearing her story it sounds as if she likely injured this utilizing a pumis stone that she uses to work on her calluses. At one point she even asked me if there was a different way that she could potentially work on all of the callous and  dry skin on her foot other than utilizing the stone. With that being said her story at this point was that she felt that she may have burnt her foot specifically the great toe on a space heater that she keeps on the bed stand beside her bed. With that being said I do not think that's very likely the toes around and all the surrounding region does not appear to show any signs of thermal injury nor does this ulcer appear to really be thermal in nature. It very much looks more like a diabetic foot ulcer. Patient's most recent hemoglobin A1c was 11.2 that was in January 2019. She has not had this check since she tells me that her blood sugars run in the "300s". Otherwise not much has really changed since I saw her previously. 06/02/17- She is here in follow-up evaluation for right great toe ulcer. Plain film x-ray revealed evidence worrisome for osteomyelitis, will order MRI; we discussed these  findings. She presents to the clinic with feelings of hypoglycemia, she was provided with an orange juice in her glucose was 83; despite this being a normal glucose level am not surprised she is having hypoglycemic symptoms. She tolerated debridement. We discussed the need for tight glycemic control (her a1c has been 11 in Nov and Jan), offloading/reduced trauma and compliance with medical treatment plan. A consult for ID was placed 06/09/17-She is here in follow-up evaluation for right great toe ulcer. Her MRI is scheduled for tomorrow. The wound is significantly improved with granulation tissue encompassing most of the wound, small amount of nonviable tissue close to the nail with uneven coloration of the toenail itself, currently no lifting. She has an appointment with podiatry and endocrinology on 4/9; an appointment with Dr. Ola Spurr on 4/11. I prescribed Levaquin last week which she has not started. Due to the improvement of the wound with granulation tissue, I cultured the wound after debridement and we will hold off on initiating Levaquin. I will reach out to her nephrologist, Dr. Lorrene Reid regarding antibiotic selection. If the MRI is negative for osteomyelitis we will cancel her appointment with Dr. Ola Spurr. She states she has been more diligent in maintaining better glycemic control with more levels being below 200 then over; she has been encouraged to maintain this for continued wound healing. 06/16/17-She is here in follow up evaluation for right great toe ulcer. 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 She does have an SHERESA, CULLOP ([id number]) appointment with Dr. Ola Spurr on 4/11. Culture that was obtained last week grew oxacillin sensitive staph aureus, she was initiated on the Levaquin that was originally ordered on 3/21. She admits to taking her loading dose on Monday 4/1 and starting her 250 mg daily dose on 4/2. We will extend the Levaquin 250 mg daily through her appointment with Dr.  Ola Spurr. There continues to be improvement in both measurements and appearance, we will transition from Santyl to [facility]. She states her glucose levels are consistently less than 200 with fewer times greater than 200. She will follow-up next week Readmission: 12/01/17 on evaluation today patient presents for reevaluation due to ulcers on the left foot. She tells me at this point though honestly she is a poor historian but she is unsure when these all showed up. She's been tolerating the dressing changes without complication using just a in the ointment and a Band-Aid as needed. With that being said she did become somewhat concerned about these and therefore made the appointment to come in for further evaluation  with [place]. Fortunately there is no evidence of infection at this time. 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 Currently all the wounds on the left. No fevers, chills, nausea, or vomiting noted at this time. Patient s last A1c was 8.15 October 2017. Wound History Patient presents with 4 open wounds that have been present for approximately 4 weeks. Patient has been treating wounds in the following manner: dry bandage. Laboratory tests have not been performed in the last month. Patient reportedly has not tested positive for an antibiotic resistant organism. Patient reportedly has not tested positive for osteomyelitis. Patient reportedly has not had testing performed to evaluate circulation in the legs. Patient History Information obtained from Patient. Allergies Shellfish Containing Products, Benadryl (Severity: Severe, Reaction: anaphalaxis), banana, nitrofurantoin, Infed, penicillin, doxycycline Family History Diabetes - Father, Heart Disease - Father, Hypertension - Mother,Father, No family history of Cancer, Hereditary Spherocytosis, Kidney Disease, Lung Disease, Seizures, Stroke, Thyroid Problems, Tuberculosis. Social History Never smoker, Marital Status  - Single, Alcohol Use - Never, Drug Use - No History, Caffeine Use - Daily. Medical History Eyes Denies history of Cataracts, Glaucoma, Optic Neuritis Ear/Nose/Mouth/Throat Denies history of Chronic sinus problems/congestion, Middle ear problems Hematologic/Lymphatic Denies history of Hemophilia, Human Immunodeficiency Virus, Lymphedema, Sickle Cell Disease Cardiovascular Denies history of Angina, Arrhythmia, Congestive Heart Failure, Coronary Artery Disease, Deep Vein Thrombosis, Hypotension, Myocardial Infarction, Peripheral Arterial Disease, Peripheral Venous Disease, Phlebitis, Vasculitis Endocrine Denies history of Type I Diabetes Genitourinary Denies history of End Stage Renal Disease Immunological Denies history of Lupus Erythematosus, Raynaud s, Scleroderma Integumentary (Skin) Denies history of History of Burn, History of pressure wounds Musculoskeletal Denies history of Gout, Rheumatoid Arthritis, Osteomyelitis Neurologic Denies history of Dementia, Quadriplegia, Paraplegia [name], [name] ([id number]) Patient is treated with Insulin. Blood sugar is tested. Medical And Surgical History Notes Genitourinary kidney transplant 2007 Review of Systems (ROS) Constitutional Symptoms (General Health) Denies complaints or symptoms of Fatigue, Fever, Chills, Marked Weight Change. Eyes Denies complaints or symptoms of Dry Eyes, Vision Changes, Glasses / Contacts. Ear/Nose/Mouth/Throat Denies complaints or symptoms of Difficult clearing ears, Sinusitis. Hematologic/Lymphatic Denies complaints or symptoms of Bleeding / Clotting Disorders, Human Immunodeficiency Virus. Cardiovascular Denies complaints or symptoms of Chest pain, LE edema. Endocrine Denies complaints or symptoms of Hepatitis, Thyroid disease, Polydypsia (Excessive Thirst). Genitourinary Denies complaints or symptoms of Kidney failure/ Dialysis, Incontinence/dribbling. Immunological Denies complaints or symptoms of  Hives, Itching. Integumentary (Skin) Denies complaints or symptoms of Wounds, Bleeding or bruising tendency, Breakdown, Swelling. Musculoskeletal Denies complaints or symptoms of Muscle Pain, Muscle Weakness. Neurologic Denies complaints or symptoms of Numbness/parasthesias, Focal/Weakness. Objective Constitutional patient is hypertensive.. pulse regular and within target range for patient.Marland Kitchen respirations regular, non-labored and within target range for patient.Marland Kitchen temperature within target range for patient.. Well-nourished and well-hydrated in no acute distress. Vitals Time Taken: 12:55 PM, Temperature: 98.2 F, Pulse: 96 bpm, Respiratory Rate: 18 breaths/min, Blood Pressure: 146/98 mmHg. Eyes conjunctiva clear no eyelid edema noted. pupils equal round and reactive to light and accommodation. Ears, Nose, Mouth, and Throat no gross abnormality of ear auricles or external auditory canals. normal hearing noted during conversation. mucus membranes moist. Respiratory normal breathing without difficulty. clear to auscultation bilaterally. Cardiovascular [name], [place]. ([id number]) regular rate and rhythm with normal S1, S2. 2+ dorsalis pedis/posterior tibialis pulses. no clubbing, cyanosis, significant edema, Gastrointestinal (GI) soft, non-tender, non-distended, +BS. no ventral hernia noted. Musculoskeletal normal gait and posture. no significant deformity or arthritic changes, no loss or range of motion, no clubbing. Psychiatric  this patient is able to make decisions and demonstrates good insight into disease process. Alert and Oriented x 3. pleasant and cooperative. General Notes: On inspection today patient's wounds actually show signs of varying degrees of severity. I did sharply debride all wound locations today which the patient tolerated without complication. Post debridement the wound beds tend to look better in general. There's no evidence of infection at this point in time. She  did not have any discomfort with debridement at this time. Integumentary (Hair, Skin) Wound #4 status is Open. Original cause of wound was Gradually Appeared. The wound is located on the Left Toe Great. The wound measures 1.5cm length x 2.3cm width x 0.3cm depth; 2.71cm^2 area and 0.813cm^3 volume. There is Fat Layer (Subcutaneous Tissue) Exposed exposed. There is no tunneling or undermining noted. There is a medium amount of serous drainage noted. The wound margin is flat and intact. There is large (67-100%) red granulation within the wound bed. There is a small (1-33%) amount of necrotic tissue within the wound bed including Adherent Slough. The periwound skin appearance did not exhibit: Callus, Crepitus, Excoriation, Induration, Rash, Scarring, Dry/Scaly, Maceration, Atrophie Blanche, Cyanosis, Ecchymosis, Hemosiderin Staining, Mottled, Pallor, Rubor, Erythema. Wound #5 status is Open. Original cause of wound was Gradually Appeared. The wound is located on the Left,Medial Toe Second. The wound measures 1cm length x 0.4cm width x 0.2cm depth; 0.314cm^2 area and 0.063cm^3 volume. There is Fat Layer (Subcutaneous Tissue) Exposed exposed. There is no tunneling or undermining noted. There is a medium amount of serous drainage noted. The wound margin is flat and intact. There is large (67-100%) red granulation within the wound bed. There is a small (1-33%) amount of necrotic tissue within the wound bed including Adherent Slough. The periwound skin appearance did not exhibit: Callus, Crepitus, Excoriation, Induration, Rash, Scarring, Dry/Scaly, Maceration, Atrophie Blanche, Cyanosis, Ecchymosis, Hemosiderin Staining, Mottled, Pallor, Rubor, Erythema. Periwound temperature was noted as No Abnormality. Wound #6 status is Open. Original cause of wound was Gradually Appeared. The wound is located on the Left,Distal Toe Second. The wound measures 0.1cm length x 0.1cm width x 0.1cm depth; 0.008cm^2 area and  0.001cm^3 volume. There is no tunneling or undermining noted. There is a none present amount of drainage noted. The wound margin is flat and intact. There is no granulation within the wound bed. There is a large (67-100%) amount of necrotic tissue within the wound bed including Eschar. The periwound skin appearance did not exhibit: Callus, Crepitus, Excoriation, Induration, Rash, Scarring, Dry/Scaly, Maceration, Atrophie Blanche, Cyanosis, Ecchymosis, Hemosiderin Staining, Mottled, Pallor, Rubor, Erythema. Periwound temperature was noted as No Abnormality. Wound #7 status is Open. Original cause of wound was Gradually Appeared. The wound is located on the Left Toe Third. The wound measures 0.2cm length x 0.2cm width x 0.2cm depth; 0.031cm^2 area and 0.006cm^3 volume. There is Fat Layer (Subcutaneous Tissue) Exposed exposed. There is no tunneling or undermining noted. There is a medium amount of serous drainage noted. The wound margin is flat and intact. There is large (67-100%) red granulation within the wound bed. There is a small (1-33%) amount of necrotic tissue within the wound bed including Adherent Slough. The periwound skin appearance did not exhibit: Callus, Crepitus, Excoriation, Induration, Rash, Scarring, Dry/Scaly, Maceration, Atrophie Blanche, Cyanosis, Ecchymosis, Hemosiderin Staining, Mottled, Pallor, Rubor, Erythema. Periwound temperature was noted as No Abnormality. Assessment Active Problems [name], [name] ([id number]) ICD-10 Type 2 diabetes mellitus with foot ulcer Non-pressure chronic ulcer of other part of left foot with fat  layer exposed Kidney transplant status Essential (primary) hypertension Procedures Wound #4 Pre-procedure diagnosis of Wound #4 is a Diabetic Wound/Ulcer of the Lower Extremity located on the Left Toe Great .Severity of Tissue Pre Debridement is: Fat layer exposed. There was a Excisional Skin/Subcutaneous Tissue Debridement with a total area of 3.45  sq cm performed by STONE III, Dallin Mccorkel E., PA-C. With the following instrument(s): Curette to remove Viable and Non-Viable tissue/material. Material removed includes Subcutaneous Tissue and Slough and after achieving pain control using Other (lidocaine 4%). No specimens were taken.A Minimum amount of bleeding was controlled with Pressure. The procedure was tolerated well. Post Debridement Measurements: 1.5cm length x 2.3cm width x 0.4cm depth; 1.084cm^3 volume. Character of Wound/Ulcer Post Debridement requires further debridement. Severity of Tissue Post Debridement is: Fat layer exposed. Post procedure Diagnosis Wound #4: Same as Pre-Procedure Wound #5 Pre-procedure diagnosis of Wound #5 is a Diabetic Wound/Ulcer of the Lower Extremity located on the Left,Medial Toe Second .Severity of Tissue Pre Debridement is: Fat layer exposed. There was a Excisional Skin/Subcutaneous Tissue Debridement with a total area of 0.4 sq cm performed by STONE III, Sebastian Dzik E., PA-C. With the following instrument(s): Curette to remove Viable and Non-Viable tissue/material. Material removed includes Subcutaneous Tissue and Slough and after achieving pain control using Other (lidocaine 4%). No specimens were taken.A Minimum amount of bleeding was controlled with Pressure. The procedure was tolerated well. Post Debridement Measurements: 1cm length x 0.4cm width x 0.2cm depth; 0.063cm^3 volume. Character of Wound/Ulcer Post Debridement requires further debridement. Severity of Tissue Post Debridement is: Fat layer exposed. Post procedure Diagnosis Wound #5: Same as Pre-Procedure Wound #6 Pre-procedure diagnosis of Wound #6 is a Diabetic Wound/Ulcer of the Lower Extremity located on the Left,Distal Toe Second .Severity of Tissue Pre Debridement is: Fat layer exposed. There was a Excisional Skin/Subcutaneous Tissue Debridement with a total area of 0.06 sq cm performed by STONE III, Robinette Esters E., PA-C. With the following  instrument(s): Curette to remove Viable and Non-Viable tissue/material. Material removed includes Callus, Subcutaneous Tissue, and Slough after achieving pain control using Other (lidocaine 4%). No specimens were taken.A Minimum amount of bleeding was controlled with Pressure. The procedure was tolerated well. Post Debridement Measurements: 0.3cm length x 0.2cm width x 0.3cm depth; 0.014cm^3 volume. Character of Wound/Ulcer Post Debridement requires further debridement. Severity of Tissue Post Debridement is: Fat layer exposed. Post procedure Diagnosis Wound #6: Same as Pre-Procedure Wound #7 Pre-procedure diagnosis of Wound #7 is a Diabetic Wound/Ulcer of the Lower Extremity located on the Left Toe Third .Severity of Tissue Pre Debridement is: Fat layer exposed. There was a Excisional Skin/Subcutaneous Tissue Debridement with a total area of 0.04 sq cm performed by STONE III, Archer Vise E., PA-C. With the following instrument(s): Curette to remove Viable and Non-Viable tissue/material. Material removed includes Subcutaneous Tissue and Slough and after achieving pain control using Other (lidocaine 4%). No specimens were taken.A Minimum amount of bleeding was controlled with Pressure. The procedure was tolerated well. Post Debridement Measurements: 0.2cm length x 0.2cm width x [name], [name]. ([id number]) 0.3cm depth; 0.009cm^3 volume. Character of Wound/Ulcer Post Debridement requires further debridement. Severity of Tissue Post Debridement is: Fat layer exposed. Post procedure Diagnosis Wound #7: Same as Pre-Procedure Plan Wound Cleansing: Wound #4 Left Toe Great: Clean wound with Normal Saline. Wound #5 Left,Medial Toe Second: Clean wound with Normal Saline. Wound #6 Left,Distal Toe Second: Clean wound with Normal Saline. Wound #7 Left Toe Third: Clean wound with Normal Saline. Anesthetic (add to Medication List): Wound #  4 Left Toe Great: Topical Lidocaine 4% cream applied to wound bed  prior to debridement (In Clinic Only). Wound #5 Left,Medial Toe Second: Topical Lidocaine 4% cream applied to wound bed prior to debridement (In Clinic Only). Wound #6 Left,Distal Toe Second: Topical Lidocaine 4% cream applied to wound bed prior to debridement (In Clinic Only). Wound #7 Left Toe Third: Topical Lidocaine 4% cream applied to wound bed prior to debridement (In Clinic Only). Primary Wound Dressing: Wound #4 Left Toe Great: Santyl Ointment Wound #5 Left,Medial Toe Second: Silver Alginate Wound #6 Left,Distal Toe Second: Silver Alginate Wound #7 Left Toe Third: Santyl Ointment Secondary Dressing: Wound #4 Left Toe Great: Gauze and Kerlix/Conform Wound #5 Left,Medial Toe Second: Gauze and Kerlix/Conform Wound #6 Left,Distal Toe Second: Gauze and Kerlix/Conform Wound #7 Left Toe Third: Gauze and Kerlix/Conform Dressing Change Frequency: Wound #4 Left Toe Great: Change dressing every day. Wound #5 Left,Medial Toe Second: Change dressing every other day. Wound #6 Left,Distal Toe Second: Change dressing every other day. Wound #7 Left Toe Third: Change dressing every day. Follow-up Appointments: Wound #4 Left [address][name], [name] ([id number]) Return Appointment in 1 week. Wound #5 Left,Medial Toe Second: Return Appointment in 1 week. Wound #6 Left,Distal Toe Second: Return Appointment in 1 week. Wound #7 Left Toe Third: Return Appointment in 1 week. Edema Control: Wound #4 Left Toe Great: Elevate legs to the level of the heart and pump ankles as often as possible Wound #5 Left,Medial Toe Second: Elevate legs to the level of the heart and pump ankles as often as possible Wound #6 Left,Distal Toe Second: Elevate legs to the level of the heart and pump ankles as often as possible Wound #7 Left Toe Third: Elevate legs to the level of the heart and pump ankles as often as possible Off-Loading: Wound #4 Left Toe Great: Other: - do not let toes rub in  shoes. Wound #5 Left,Medial Toe Second: Other: - do not let toes rub in shoes. Wound #6 Left,Distal Toe Second: Other: - do not let toes rub in shoes. Wound #7 Left Toe Third: Other: - do not let toes rub in shoes. Medications-please add to medication list.: Wound #4 Left Toe Great: Santyl Enzymatic Ointment Wound #7 Left Toe Third: Santyl Enzymatic Ointment The following medication(s) was prescribed: Santyl topical 250 unit/gram ointment ointment topical applied nickel thick to the wound bed and then cover with a dressing as directed starting 12/05/2017 At this point I discussed with patient that she really needs to keep a very close watch and I on her feet and what's going on in that regard. Again she has previously had ulcers of her right foot for which I have taken care of her and she unfortunately continues to have new areas that show up and she's really not even sure most of the time why or how they occurred. She also really does not know exactly when they occurred. She does not seem to be keeping a close eye on her feet and whether or not she gets wounds I think this is something that she really needs to work on. This was discussed with her today. At this point we can initiate the above wound care measures for the next week and the patient is in agreement with that plan. Please see above for specific wound care orders. We will see patient for re-evaluation in 1 week(s) here in the clinic. If anything worsens or changes patient will contact our office for additional recommendations. Electronic Signature(s) Signed:  12/06/2017 9:18:02 AM By: Worthy [name] PA-C Previous Signature: 12/01/2017 4:52:54 PM Version By: Worthy [name] PA-C Entered By: Worthy [name] on 12/05/2017 17:05:26 [name] ([id number]) -------------------------------------------------------------------------------- ROS/PFSH Details Patient Name: [name] Date of Service: 12/01/2017 1:00 PM Medical  Record Number: [id number] Patient Account Number: [id number] Date of Birth/Sex: 06-Aug-1985 (32 y.o. F) Treating RN: Secundino Ginger Primary Care Provider: [name] Other Clinician: Referring Provider: [name] Treating Provider/Extender: STONE III, Elam Ellis Weeks in Treatment: 0 Information Obtained From Patient Wound History Do you currently have one or more open woundso Yes How many open wounds do you currently haveo 4 Approximately how long have you had your woundso 4 weeks How have you been treating your wound(s) until nowo dry bandage Has your wound(s) ever healed and then re-openedo No Have you had any lab work done in the past montho No Have you tested positive for an antibiotic resistant organism (MRSA, VRE)o No Have you tested positive for osteomyelitis (bone infection)o No Have you had any tests for circulation on your legso No Constitutional Symptoms (General Health) Complaints and Symptoms: Negative for: Fatigue; Fever; Chills; Marked Weight Change Eyes Complaints and Symptoms: Negative for: Dry Eyes; Vision Changes; Glasses / Contacts Medical History: Negative for: Cataracts; Glaucoma; Optic Neuritis Ear/Nose/Mouth/Throat Complaints and Symptoms: Negative for: Difficult clearing ears; Sinusitis Medical History: Negative for: Chronic sinus problems/congestion; Middle ear problems Hematologic/Lymphatic Complaints and Symptoms: Negative for: Bleeding / Clotting Disorders; Human Immunodeficiency Virus Medical History: Positive for: Anemia Negative for: Hemophilia; Human Immunodeficiency Virus; Lymphedema; Sickle Cell Disease Cardiovascular Complaints and Symptoms: Negative for: Chest pain; LE edema Medical History: Positive for: Hypertension [name], [name] ([id number]) Negative for: Angina; Arrhythmia; Congestive Heart Failure; Coronary Artery Disease; Deep Vein Thrombosis; Hypotension; Myocardial Infarction; Peripheral Arterial Disease; Peripheral Venous  Disease; Phlebitis; Vasculitis Endocrine Complaints and Symptoms: Negative for: Hepatitis; Thyroid disease; Polydypsia (Excessive Thirst) Medical History: Positive for: Type II Diabetes Negative for: Type I Diabetes Time with diabetes: 2014 Treated with: Insulin Blood sugar tested every day: Yes Tested : 3x a day Genitourinary Complaints and Symptoms: Negative for: Kidney failure/ Dialysis; Incontinence/dribbling Medical History: Negative for: End Stage Renal Disease Past Medical History Notes: kidney transplant 2007 Immunological Complaints and Symptoms: Negative for: Hives; Itching Medical History: Negative for: Lupus Erythematosus; Raynaudos; Scleroderma Integumentary (Skin) Complaints and Symptoms: Negative for: Wounds; Bleeding or bruising tendency; Breakdown; Swelling Medical History: Negative for: History of Burn; History of pressure wounds Musculoskeletal Complaints and Symptoms: Negative for: Muscle Pain; Muscle Weakness Medical History: Positive for: Osteoarthritis Negative for: Gout; Rheumatoid Arthritis; Osteomyelitis Neurologic Complaints and Symptoms: Negative for: Numbness/parasthesias; Focal/Weakness Medical History: Positive for: Neuropathy; Seizure Disorder - hx Negative for: Dementia; Quadriplegia; Paraplegia [name], [name] ([id number]) Oncologic Medical History: Negative for: Received Chemotherapy; Received Radiation Immunizations Pneumococcal Vaccine: Received Pneumococcal Vaccination: No Implantable Devices Family and Social History Cancer: No; Diabetes: Yes - Father; Heart Disease: Yes - Father; Hereditary Spherocytosis: No; Hypertension: Yes - Mother,Father; Kidney Disease: No; Lung Disease: No; Seizures: No; Stroke: No; Thyroid Problems: No; Tuberculosis: No; Never smoker; Marital Status - Single; Alcohol Use: Never; Drug Use: No History; Caffeine Use: Daily; Financial Concerns: No; Food, Clothing or Shelter Needs: No; Support System  Lacking: No; Transportation Concerns: No; Advanced Directives: No; Patient does not want information on Advanced Directives; Do not resuscitate: No; Living Will: No; Medical Power of Attorney: No Electronic Signature(s) Signed: 12/01/2017 1:21:42 PM By: Secundino Ginger Signed: 12/01/2017 4:52:54 PM By: Worthy [name] PA-C Entered By: Secundino Ginger  on 12/01/2017 13:05:27 [name], [name] ([id number]) -------------------------------------------------------------------------------- SuperBill Details Patient Name: [name] Date of Service: 12/01/2017 Medical Record Number: [id number] Patient Account Number: [id number] Date of Birth/Sex: 06/07/1985 (32 y.o. 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 transplant status I10 Essential (primary) hypertension Facility Procedures CPT4 Code: [id number] Description: [phone] - WOUND CARE VISIT-LEV 2 EST PT Modifier: Quantity: 1 CPT4 Code: [id number] Description: 11042 - DEB SUBQ TISSUE 20 SQ CM/< ICD-10 Diagnosis Description L97.522 Non-pressure chronic ulcer of other part of left foot with fat Modifier: layer exposed Quantity: 1 Physician Procedures CPT4 Code: 6384536 Description: 46803 - WC PHYS LEVEL 4 - EST PT ICD-10 Diagnosis Description 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

## 2017-12-03 NOTE — Progress Notes (Signed)
MEDIA, PIZZINI ([id number]) Visit Report for 12/01/2017 Abuse/Suicide Risk Screen Details Patient Name: [name], [name] Date of Service: 12/01/2017 1:00 PM Medical Record Number: [id number] Patient Account Number: [id number] Date of Birth/Sex: February 16, 1986 (32 y.o. F) Treating RN: Secundino Ginger Primary Care [place]: [name] Other Clinician: Referring Terrall Bley: [name] Treating Shan Valdes/Extender: STONE III, HOYT Weeks in Treatment: 0 Abuse/Suicide Risk Screen Items Answer ABUSE/SUICIDE RISK SCREEN: Has anyone close to you tried to hurt or harm you recentlyo No Do you feel uncomfortable with anyone in your familyo No Has anyone forced you do things that you didnot want to doo No Do you have any thoughts of harming yourselfo No Patient displays signs or symptoms of abuse and/or neglect. No Electronic Signature(s) Signed: 12/01/2017 1:21:42 PM By: Secundino Ginger Entered By: Secundino Ginger on 12/01/2017 13:05:36 [name] ([id number]) -------------------------------------------------------------------------------- Activities of Daily Living Details Patient Name: [name] Date of Service: 12/01/2017 1:00 PM Medical Record Number: [id number] Patient Account Number: [id number] Date of Birth/Sex: 1985-06-28 (32 y.o. F) Treating RN: Secundino Ginger Primary Care Annastasia Haskins: [name] Other Clinician: Referring Jamille Fisher: [name] Treating Brookelynne Dimperio/Extender: STONE III, HOYT Weeks in Treatment: 0 Activities of Daily Living Items Answer Activities of Daily Living (Please select one for each item) Drive Automobile Completely Able Take Medications Completely Able Use Telephone Completely Able Care for Appearance Completely Able Use Toilet Completely Able Bath / Shower Completely Able Dress Self Completely Able Feed Self Completely Able Walk Completely Able Get In / Out Bed Completely Able Housework Completely Able Prepare Meals Completely [place] Completely Able Shop  for Self Completely Able Electronic Signature(s) Signed: 12/01/2017 1:21:42 PM By: Secundino Ginger Entered By: Secundino Ginger on 12/01/2017 13:06:08 [name] ([id number]) -------------------------------------------------------------------------------- Education Assessment Details Patient Name: [name] Date of Service: 12/01/2017 1:00 PM Medical Record Number: [id number] Patient Account Number: [id number] Date of Birth/Sex: [birth year] (32 y.o. F) Treating RN: Secundino Ginger Primary Care Leonel Mccollum: [name] Other Clinician: Referring Ladarrius Bogdanski: [name] Treating Artemus Romanoff/Extender: Melburn Hake, HOYT Weeks in Treatment: 0 Primary Learner Assessed: Patient Learning Preferences/Education Level/Primary Language Learning Preference: Explanation, Demonstration Highest Education Level: High School Preferred Language: English Cognitive Barrier Assessment/Beliefs Language Barrier: No Translator Needed: No Memory Deficit: No Emotional Barrier: No Cultural/Religious Beliefs Affecting Medical Care: No Physical Barrier Assessment Impaired Vision: No Impaired Hearing: No Decreased Hand dexterity: No Knowledge/Comprehension Assessment Knowledge Level: Medium Comprehension Level: Medium Ability to understand written Medium instructions: Ability to understand verbal Medium instructions: Motivation Assessment Anxiety Level: Calm Cooperation: Cooperative Education Importance: Acknowledges Need Interest in Health Problems: Asks Questions Perception: Coherent Willingness to Engage in Self- Medium Management Activities: Readiness to Engage in Self- Medium Management Activities: Electronic Signature(s) Signed: 12/01/2017 1:21:42 PM By: Secundino Ginger Entered By: Secundino Ginger on 12/01/2017 13:06:37 [name] ([id number]) -------------------------------------------------------------------------------- Fall Risk Assessment Details Patient Name: [name] Date of Service: 12/01/2017  1:00 PM Medical Record Number: [id number] Patient Account Number: [id number] Date of Birth/Sex: [birth year] (32 y.o. F) Treating RN: Secundino Ginger Primary Care Brekyn [place]: [name] Other Clinician: Referring Lorie Cleckley: [name] Treating Chioke Noxon/Extender: Melburn Hake, HOYT Weeks in Treatment: 0 Fall Risk Assessment Items Have you had 2 or more falls in the last 12 monthso 0 No Have you had any fall that resulted in injury in the last 12 monthso 0 No FALL RISK ASSESSMENT: History of falling - immediate or within 3 months 0 No Secondary diagnosis 0 No Ambulatory aid None/bed rest/wheelchair/nurse 0 No Crutches/cane/walker 0 No Furniture 0  No IV Access/Saline Lock 0 No Gait/Training Normal/bed rest/immobile 0 Yes Weak 0 No Impaired 0 No Mental Status Oriented to own ability 0 Yes Electronic Signature(s) Signed: 12/01/2017 1:21:42 PM By: Secundino Ginger Entered By: Secundino Ginger on 12/01/2017 13:06:49 [name] ([id number]) -------------------------------------------------------------------------------- Foot Assessment Details Patient Name: [name] Date of Service: 12/01/2017 1:00 PM Medical Record Number: [id number] Patient Account Number: [id number] Date of Birth/Sex: 1985-10-03 (32 y.o. F) Treating RN: Secundino Ginger Primary Care Jackalyn Haith: [name] Other Clinician: Referring Tahani Potier: [name] Treating Dennys Guin/Extender: STONE III, HOYT Weeks in Treatment: 0 Foot Assessment Items Site Locations + = Sensation present, - = Sensation absent, C = Callus, U = Ulcer R = Redness, W = Warmth, M = Maceration, PU = Pre-ulcerative lesion F = Fissure, S = Swelling, D = Dryness Assessment Right: Left: Other Deformity: No No Prior Foot Ulcer: No No Prior Amputation: No No Charcot Joint: No No Ambulatory Status: Ambulatory Without Help Gait: Steady Electronic Signature(s) Signed: 12/01/2017 1:21:42 PM By: Secundino Ginger Entered By: Secundino Ginger on 12/01/2017 13:08:18 [name] ([id number]) -------------------------------------------------------------------------------- Nutrition Risk Assessment Details Patient Name: [name] Date of Service: 12/01/2017 1:00 PM Medical Record Number: [id number] Patient Account Number: [id number] Date of Birth/Sex: 03/07/1986 (32 y.o. F) Treating RN: Secundino Ginger Primary Care Faraaz Wolin: [name] Other Clinician: Referring Corban Kistler: [name] Treating Philopater Mucha/Extender: STONE III, HOYT Weeks in Treatment: 0 Height (in): Weight (lbs): Body Mass Index (BMI): Nutrition Risk Assessment Items NUTRITION RISK SCREEN: I have an illness or condition that made me change the kind and/or amount of 0 No food I eat I eat fewer than two meals per day 0 No I eat few fruits and vegetables, or milk products 0 No I have three or more drinks of beer, liquor or wine almost every day 0 No I have tooth or mouth problems that make it hard for me to eat 0 No I don't always have enough money to buy the food I need 0 No I eat alone most of the time 0 No I take three or more different prescribed or over-the-counter drugs a day 1 Yes Without wanting to, I have lost or gained 10 pounds in the last six months 0 No I am not always physically able to shop, [employer] and/or feed myself 0 No Nutrition Protocols Good Risk Protocol 0 No interventions needed Moderate Risk Protocol Electronic Signature(s) Signed: 12/01/2017 1:21:42 PM By: Secundino Ginger Entered BySecundino Ginger on 12/01/2017 13:06:57

## 2017-12-07 DIAGNOSIS — E1121 Type 2 diabetes mellitus with diabetic nephropathy: Secondary | ICD-10-CM | POA: Diagnosis not present

## 2017-12-07 DIAGNOSIS — I10 Essential (primary) hypertension: Secondary | ICD-10-CM | POA: Diagnosis not present

## 2017-12-07 DIAGNOSIS — Z940 Kidney transplant status: Secondary | ICD-10-CM | POA: Diagnosis not present

## 2017-12-07 DIAGNOSIS — R109 Unspecified abdominal pain: Secondary | ICD-10-CM | POA: Diagnosis not present

## 2017-12-07 DIAGNOSIS — E0842 Diabetes mellitus due to underlying condition with diabetic polyneuropathy: Secondary | ICD-10-CM | POA: Diagnosis not present

## 2017-12-08 ENCOUNTER — Encounter: Payer: Medicare Other | Admitting: Physician Assistant

## 2017-12-08 ENCOUNTER — Ambulatory Visit (HOSPITAL_COMMUNITY)
Admission: RE | Admit: 2017-12-08 | Discharge: 2017-12-08 | Disposition: A | Discharge status: Home / Self Care | Payer: Medicare Other | Source: Ambulatory Visit | Attending: Nephrology | Admitting: Nephrology

## 2017-12-08 VITALS — BP 140/110 | HR 108 | Resp 18

## 2017-12-08 DIAGNOSIS — D631 Anemia in chronic kidney disease: Secondary | ICD-10-CM | POA: Insufficient documentation

## 2017-12-08 DIAGNOSIS — N182 Chronic kidney disease, stage 2 (mild): Secondary | ICD-10-CM

## 2017-12-08 DIAGNOSIS — L97822 Non-pressure chronic ulcer of other part of left lower leg with fat layer exposed: Secondary | ICD-10-CM | POA: Diagnosis not present

## 2017-12-08 DIAGNOSIS — E11621 Type 2 diabetes mellitus with foot ulcer: Secondary | ICD-10-CM | POA: Diagnosis not present

## 2017-12-08 DIAGNOSIS — N183 Chronic kidney disease, stage 3 (moderate): Secondary | ICD-10-CM | POA: Insufficient documentation

## 2017-12-08 DIAGNOSIS — I10 Essential (primary) hypertension: Secondary | ICD-10-CM | POA: Diagnosis not present

## 2017-12-08 DIAGNOSIS — M1990 Unspecified osteoarthritis, unspecified site: Secondary | ICD-10-CM | POA: Diagnosis not present

## 2017-12-08 DIAGNOSIS — D649 Anemia, unspecified: Secondary | ICD-10-CM | POA: Diagnosis not present

## 2017-12-08 DIAGNOSIS — L97522 Non-pressure chronic ulcer of other part of left foot with fat layer exposed: Secondary | ICD-10-CM | POA: Diagnosis not present

## 2017-12-08 DIAGNOSIS — E1140 Type 2 diabetes mellitus with diabetic neuropathy, unspecified: Secondary | ICD-10-CM | POA: Diagnosis not present

## 2017-12-08 LAB — IRON AND TIBC
IRON: 79 ug/dL (ref 28–170)
SATURATION RATIOS: 38 % — AB (ref 10.4–31.8)
TIBC: 209 ug/dL — AB (ref 250–450)
UIBC: 130 ug/dL

## 2017-12-08 LAB — FERRITIN: Ferritin: 1383 ng/mL — ABNORMAL HIGH (ref 11–307)

## 2017-12-08 MED ORDER — EPOETIN ALFA 10000 UNIT/ML IJ SOLN
INTRAMUSCULAR | Status: AC
  Administered 2017-12-08: 10000 [IU] via SUBCUTANEOUS
  Filled 2017-12-08: qty 1

## 2017-12-08 MED ORDER — EPOETIN ALFA 40000 UNIT/ML IJ SOLN: 30000.0000 [IU] | INTRAMUSCULAR | Status: DC

## 2017-12-08 MED ORDER — EPOETIN ALFA 20000 UNIT/ML IJ SOLN
INTRAMUSCULAR | Status: AC
  Administered 2017-12-08: 20000 [IU] via SUBCUTANEOUS
  Filled 2017-12-08: qty 1

## 2017-12-09 ENCOUNTER — Ambulatory Visit: Payer: Medicare Other | Admitting: Physician Assistant

## 2017-12-09 LAB — POCT HEMOGLOBIN-HEMACUE: Hemoglobin: 9.9 g/dL — ABNORMAL LOW (ref 12.0–15.0)

## 2017-12-11 NOTE — Progress Notes (Signed)
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 noted. The wound margin is flat and intact. There is large (67-100%) red granulation within the wound bed. There is a small (1-33%) amount of necrotic tissue within the wound bed including Adherent Slough. The periwound skin appearance did not exhibit: Callus, Crepitus, Excoriation, Induration, Rash, Scarring, Dry/Scaly, Maceration, Atrophie Blanche, Cyanosis, Ecchymosis, Hemosiderin Staining, Mottled, Pallor, Rubor, Erythema. Periwound temperature was noted as No Abnormality. Wound #5 status is Open. Original cause of wound was Gradually Appeared. The wound is located on the Left,Medial Toe Second. The wound measures 0.6cm length x 0.2cm width x 0.1cm depth; 0.094cm^2 area and 0.009cm^3 volume. There is Fat Layer (Subcutaneous Tissue) Exposed exposed. There is no tunneling or undermining noted. There is a none present amount of drainage noted. The wound margin is flat and intact. There is no granulation within the wound bed. There is no necrotic tissue within the wound  bed. The periwound skin appearance did not exhibit: Callus, Crepitus, Excoriation, Induration, Rash, Scarring, Dry/Scaly, Maceration, Atrophie Blanche, Cyanosis, Ecchymosis, Hemosiderin Staining, Mottled, Pallor, Rubor, Erythema. Periwound temperature was noted as No Abnormality. Wound #6 status is Open. Original cause of wound was Gradually Appeared. The wound is located on the Left,Distal Toe Second. The wound measures 0.1cm length x 0.1cm width x 0.1cm depth; 0.008cm^2 area and 0.001cm^3 volume. There is no tunneling or undermining noted. There is a none present amount of drainage noted. The wound margin is flat and intact. There is no granulation within the wound bed. There is no necrotic tissue within the wound bed. The periwound skin appearance exhibited: Callus. The periwound skin appearance did not exhibit: Crepitus, Excoriation, Induration, Rash, Scarring, Dry/Scaly, Maceration, Atrophie Blanche, Cyanosis, Ecchymosis, Hemosiderin Staining, Mottled, Pallor, Rubor, Erythema. Periwound temperature was noted as No Abnormality. Wound #7 status is Open. Original cause of wound was Gradually Appeared. The wound is located on the Left Toe Third. The wound measures 0.1cm length x 0.1cm width x 0.1cm depth; 0.008cm^2 area and 0.001cm^3 volume. There is Fat Layer (Subcutaneous Tissue) Exposed exposed. There is no tunneling or undermining noted. There is a none present amount of drainage noted. The wound margin is flat and intact. There is no granulation within the wound bed. There is no necrotic tissue within the wound bed. The periwound skin appearance exhibited: Callus. The periwound skin appearance did not exhibit: Crepitus, Excoriation, Induration, Rash, Scarring, Dry/Scaly, Maceration, Atrophie Blanche, Cyanosis, Ecchymosis, Hemosiderin Staining, Mottled, Pallor, Rubor, Erythema. Periwound temperature was noted as No Abnormality.  Assessment Active Problems ICD-10 Type 2 diabetes mellitus with  foot ulcer Non-pressure chronic ulcer of other part of left foot with fat layer exposed Kidney transplant status [name], [name] ([id number]) Essential (primary) hypertension Procedures Wound #4 Pre-procedure diagnosis of Wound #4 is a Diabetic Wound/Ulcer of the Lower Extremity located on the Left Toe Great .Severity of Tissue Pre Debridement is: Fat layer exposed. There was a Excisional Skin/Subcutaneous Tissue Debridement with a total area of 2.76 sq cm performed by STONE III, HOYT E., PA-C. With the following instrument(s): Curette to remove Viable and Non-Viable tissue/material. Material removed includes Subcutaneous Tissue, Slough, and Biofilm after achieving pain control using Other (lidocaine 4%). A time out was conducted at 15:41, prior to the start of the procedure. A Minimum amount of bleeding was controlled with Pressure. The procedure was tolerated well with a pain level of 0 throughout and a pain level of 0 following the procedure. Post Debridement Measurements: 1.2cm length x 2.3cm width x 0.3cm depth; 0.65cm^3 volume. Character of Wound/Ulcer Post Debridement is stable. Severity of Tissue Post Debridement is: Fat layer exposed. Post procedure Diagnosis Wound #4: Same as Pre-Procedure Plan Wound Cleansing: Wound #4 Left Toe Great: Clean wound with Normal Saline. Wound #5 Left,Medial Toe Second: Clean wound with Normal Saline. Wound #6 Left,Distal Toe Second: Clean wound with Normal Saline. Wound #7 Left Toe Third: Clean wound with Normal Saline. Anesthetic (add to Medication List): Wound #4 Left Toe Great: Topical Lidocaine 4% cream applied to wound bed prior to debridement (In Clinic Only). Wound #5 Left,Medial Toe Second: Topical Lidocaine 4% cream applied to wound bed prior to debridement (In Clinic Only). Wound #6 Left,Distal Toe Second: Topical Lidocaine 4% cream applied to wound bed prior to debridement (In Clinic Only). Wound #7 Left Toe Third: Topical  Lidocaine 4% cream applied to wound bed prior to debridement (In Clinic Only). Primary Wound Dressing: Wound #4 Left Toe Great: Silver Collagen Wound #5 Left,Medial Toe Second: Silver Collagen Wound #6 Left,Distal Toe Second: Silver Collagen Wound #7 Left Toe Third: Other: - betadine Secondary Dressing: Wound #4 Left Toe Great: Gauze and Kerlix/Conform [name], [name] ([id number]) Wound #5 Left,Medial Toe Second: Gauze and Kerlix/Conform Wound #6 Left,Distal Toe Second: Gauze and Kerlix/Conform Wound #7 Left Toe Third: Gauze and Kerlix/Conform Dressing Change Frequency: Wound #4 Left Toe Great: Change dressing every other day. Wound #5 Left,Medial Toe Second: Change dressing every other day. Wound #6 Left,Distal Toe Second: Change dressing every other day. Wound #7 Left Toe Third: Change dressing every other day. Follow-up Appointments: Wound #4 Left Toe Great: Return Appointment in 1 week. Wound #5 Left,Medial Toe Second: Return Appointment in 1 week. Wound #6 Left,Distal Toe Second: Return Appointment in 1 week. Wound #7 Left Toe Third: Return Appointment in 1 week. Edema Control: Wound #4 Left Toe Great: Elevate legs to the level of the heart and pump ankles as often as possible Wound #5 Left,Medial Toe Second: Elevate legs to the level of the heart and pump ankles as often as possible Wound #6 Left,Distal Toe Second: Elevate legs to the level of the heart and pump ankles as often as possible Wound #7 Left Toe Third: Elevate legs to the level of the heart and pump ankles as often as possible Off-Loading: Wound #4 Left Toe Great: Other: - do not let toes rub in shoes. Wound #5 Left,Medial Toe Second: Other: - do not let toes rub in shoes. Wound #6 Left,Distal Toe Second: Other: - do not let toes rub in  shoes. Wound #7 Left Toe Third: Other: - do not let toes rub in shoes. Medications-please add to medication list.: Wound #4 Left Toe Great: Santyl Enzymatic  Ointment Wound #7 Left Toe Third: Santyl Enzymatic Ointment I am going to suggest currently that we continue with the above wound care measures for the next week. The patient is in agreement with the plan. We will subsequently see were things stand hopefully the collagen dressings will actually help with continued epithelialization between now and then. Please see above for specific wound care orders. We will see patient for re-evaluation in 1 week(s) here in the clinic. If anything worsens or changes patient will contact our office for additional recommendations. [name], [name] ([id number]) Electronic Signature(s) Signed: 12/08/2017 5:38:06 PM By: Worthy [name] PA-C Entered By: Worthy [name] on 12/08/2017 15:46:16 [name] ([id number]) -------------------------------------------------------------------------------- ROS/PFSH Details Patient Name: [name] Date of Service: 12/08/2017 3:00 PM Medical Record Number: [id number] Patient Account Number: [id number] Date of Birth/Sex: 14-Sep-1985 (32 y.o. F) Treating RN: Roger Shelter Primary Care Provider: [name] Other Clinician: Referring Provider: [name] Treating Provider/Extender: STONE III, HOYT Weeks in Treatment: 1 Information Obtained From Patient Wound History Do you currently have one or more open woundso Yes How many open wounds do you currently haveo 4 Approximately how long have you had your woundso 4 weeks How have you been treating your wound(s) until nowo dry bandage Has your wound(s) ever healed and then re-openedo No Have you had any lab work done in the past montho No Have you tested positive for an antibiotic resistant organism (MRSA, VRE)o No Have you tested positive for osteomyelitis (bone infection)o No Have you had any tests for circulation on your legso No Constitutional Symptoms (General Health) Complaints and Symptoms: Negative for: Fever; Chills Eyes Medical History: Negative  for: Cataracts; Glaucoma; Optic Neuritis Ear/Nose/Mouth/Throat Medical History: Negative for: Chronic sinus problems/congestion; Middle ear problems Hematologic/Lymphatic Medical History: Positive for: Anemia Negative for: Hemophilia; Human Immunodeficiency Virus; Lymphedema; Sickle Cell Disease Respiratory Complaints and Symptoms: No Complaints or Symptoms Cardiovascular Complaints and Symptoms: No Complaints or Symptoms Medical History: Positive for: Hypertension Negative for: Angina; Arrhythmia; Congestive Heart Failure; Coronary Artery Disease; Deep Vein Thrombosis; Hypotension; Myocardial Infarction; Peripheral Arterial Disease; Peripheral Venous Disease; Phlebitis; Vasculitis AHNIYA, MITCHUM ([id number]) Endocrine Medical History: Positive for: Type II Diabetes Negative for: Type I Diabetes Time with diabetes: 2014 Treated with: Insulin Blood sugar tested every day: Yes Tested : 3x a day Genitourinary Medical History: Negative for: End Stage Renal Disease Past Medical History Notes: kidney transplant 2007 Immunological Medical History: Negative for: Lupus Erythematosus; Raynaudos; Scleroderma Integumentary (Skin) Medical History: Negative for: History of Burn; History of pressure wounds Musculoskeletal Medical History: Positive for: Osteoarthritis Negative for: Gout; Rheumatoid Arthritis; Osteomyelitis Neurologic Medical History: Positive for: Neuropathy; Seizure Disorder - hx Negative for: Dementia; Quadriplegia; Paraplegia Oncologic Medical History: Negative for: Received Chemotherapy; Received Radiation Psychiatric Complaints and Symptoms: No Complaints or Symptoms Immunizations Pneumococcal Vaccine: Received Pneumococcal Vaccination: No Implantable Devices Family and Social History Cancer: No; Diabetes: Yes - Father; Heart Disease: Yes - Father; Hereditary Spherocytosis: No; Hypertension: Yes - Mother,Father; Kidney Disease: No; Lung Disease: No;  Seizures: No; Stroke: No; Thyroid Problems: No; Tuberculosis: No; Never smoker; Marital Status - Single; Alcohol Use: Never; Drug Use: No History; Caffeine Use: Daily; Financial Concerns: No; Food, Clothing or Shelter Needs: No; Support System Lacking: No; Transportation Concerns: No; Advanced DirectivesIRINE, HEMINGER ([id number]) No; Patient does not want information on  Advanced Directives; Do not resuscitate: No; Living Will: No; Medical Power of Attorney: No Physician Affirmation I have reviewed and agree with the above information. Electronic Signature(s) Signed: 12/08/2017 5:38:06 PM By: Worthy [name] PA-C Signed: 12/09/2017 4:22:48 PM By: Roger Shelter Entered By: Worthy [name] on 12/08/2017 15:45:31 [name] ([id number]) -------------------------------------------------------------------------------- SuperBill Details Patient Name: [name] Date of Service: 12/08/2017 Medical Record Number: [id number] Patient Account Number: [id number] Date of Birth/Sex: 1985-08-02 (32 y.o. F) Treating RN: Roger Shelter Primary Care Provider: [name] Other Clinician: Referring Provider: [name] Treating Provider/Extender: Melburn Hake, HOYT Weeks in Treatment: 1 Diagnosis Coding ICD-10 Codes Code Description E11.621 Type 2 diabetes mellitus with foot ulcer L97.522 Non-pressure chronic ulcer of other part of left foot with fat layer exposed Z94.0 Kidney transplant status I10 Essential (primary) hypertension Facility Procedures CPT4 Code: [id number] Description: 02542 - DEB SUBQ TISSUE 20 SQ CM/< ICD-10 Diagnosis Description L97.522 Non-pressure chronic ulcer of other part of left foot with fat Modifier: layer exposed Quantity: 1 Physician Procedures CPT4 Code: [id number] Description: 11042 - WC PHYS SUBQ TISS 20 SQ CM ICD-10 Diagnosis Description L97.522 Non-pressure chronic ulcer of other part of left foot with fat Modifier: layer exposed Quantity:  1 Electronic Signature(s) Signed: 12/08/2017 5:38:06 PM By: Worthy [name] PA-C Entered By: Worthy [name] on 12/08/2017 15:46:24

## 2017-12-13 NOTE — Progress Notes (Signed)
[name] ([id number]) Visit Report for 12/08/2017 Arrival Information Details Patient Name: [name] Date of Service: 12/08/2017 3:00 PM Medical Record Number: [id number] Patient Account Number: [id number] Date of Birth/Sex: January 05, 1986 (32 y.o. F) Treating RN: Roger Shelter Primary Care Tekoa Hamor: [name] Other Clinician: Referring Verner Kopischke: [name] Treating Krystn Dermody/Extender: Melburn Hake, HOYT Weeks in Treatment: 1 Visit Information History Since Last Visit Added or deleted any medications: No Patient Arrived: Ambulatory Any new allergies or adverse reactions: No Arrival Time: 15:11 Had a fall or experienced change in No Accompanied By: self activities of daily living that may affect Transfer Assistance: None risk of falls: Patient Identification Verified: Yes Signs or symptoms of abuse/neglect since last visito No Secondary Verification Process Yes Hospitalized since last visit: No Completed: Has Dressing in Place as Prescribed: Yes Patient Has Alerts: Yes Pain Present Now: Yes Patient Alerts: ABI 02/14/17 L 1.07 R 1.11 Electronic Signature(s) Signed: 12/08/2017 4:55:29 PM By: Lorine Bears RCP, RRT, CHT Entered By: Lorine Bears on 12/08/2017 15:11:40 [name] ([id number]) -------------------------------------------------------------------------------- Lower Extremity Assessment Details Patient Name: [name] Date of Service: 12/08/2017 3:00 PM Medical Record Number: [id number] Patient Account Number: [id number] Date of Birth/Sex: Apr 30, 1985 (32 y.o. F) Treating RN: Secundino Ginger Primary Care Meadow Abramo: [name] Other Clinician: Referring Selenia Mihok: [name] Treating Najir Roop/Extender: STONE III, HOYT Weeks in Treatment: 1 Edema Assessment Assessed: [Left: No] [Right: No] [Left: Edema] [Right: :] Calf Left: Right: Point of Measurement: 35 cm From Medial Instep cm cm Ankle Left: Right: Point of  Measurement: 11 cm From Medial Instep cm cm Vascular Assessment Claudication: Claudication Assessment [Left:None] Pulses: Dorsalis Pedis Palpable: [Left:Yes] Posterior Tibial Extremity colors, hair growth, and conditions: Extremity Color: [Left:Normal] Hair Growth on Extremity: [Left:No] Temperature of Extremity: [Left:Warm] Capillary Refill: [Left:< 3 seconds] Toe Nail Assessment Left: Right: Thick: Yes Discolored: Yes Deformed: Yes Improper Length and Hygiene: Yes Electronic Signature(s) Signed: 12/08/2017 4:09:51 PM By: Secundino Ginger Entered By: Secundino Ginger on 12/08/2017 15:30:18 [name] ([id number]) -------------------------------------------------------------------------------- Multi Wound Chart Details Patient Name: [name] Date of Service: 12/08/2017 3:00 PM Medical Record Number: [id number] Patient Account Number: [id number] Date of Birth/Sex: December 12, 1985 (32 y.o. F) Treating RN: Roger Shelter Primary Care Daianna Vasques: [name] Other Clinician: Referring Rosmarie Esquibel: [name] Treating Sael Furches/Extender: STONE III, HOYT Weeks in Treatment: 1 Vital Signs Height(in): 37 Pulse(bpm): 104 Weight(lbs): 174 Blood Pressure(mmHg): 145/105 Body Mass Index(BMI): 28 Temperature(F): 98.9 Respiratory Rate 18 (breaths/min): Photos: Wound Location: Left Toe Great Left Toe Second - Medial Left Toe Second - Distal Wounding Event: Gradually Appeared Gradually Appeared Gradually Appeared Primary Etiology: Diabetic Wound/Ulcer of the Diabetic Wound/Ulcer of the Diabetic Wound/Ulcer of the Lower Extremity Lower Extremity Lower Extremity Comorbid History: Anemia, Hypertension, Type II Anemia, Hypertension, Type II Anemia, Hypertension, Type II Diabetes, Osteoarthritis, Diabetes, Osteoarthritis, Diabetes, Osteoarthritis, Neuropathy, Seizure Disorder Neuropathy, Seizure Disorder Neuropathy, Seizure Disorder Date Acquired: 11/07/2017 11/07/2017 11/07/2017 Weeks of  Treatment: 1 1 1  Wound Status: Open Open Open Pending Amputation on Yes Yes Yes Presentation: Measurements L x W x D 1.2x2.3x0.3 0.6x0.2x0.1 0.1x0.1x0.1 (cm) Area (cm) : 2.168 0.094 0.008 Volume (cm) : 0.65 0.009 0.001 % Reduction in Area: 20.00% 70.10% 0.00% % Reduction in Volume: 20.00% 85.70% 0.00% Classification: Grade 1 Grade 1 Grade 1 Exudate Amount: Medium None Present None Present Exudate Type: Serous N/A N/A Exudate Color: amber N/A N/A Wound Margin: Flat and Intact Flat and Intact Flat and Intact Granulation Amount: Large (67-100%) None Present (0%) None Present (0%)  Granulation Quality: Red N/A N/A Necrotic Amount: Small (1-33%) None Present (0%) None Present (0%) Exposed Structures: Fat Layer (Subcutaneous Fat Layer (Subcutaneous Fascia: No Tissue) Exposed: Yes Tissue) Exposed: Yes Fat Layer (Subcutaneous Fascia: No Fascia: No Tissue) Exposed: No [name]. ([id number]) Tendon: No Tendon: No Tendon: No Muscle: No Muscle: No Muscle: No Joint: No Joint: No Joint: No Bone: No Bone: No Bone: No Epithelialization: None None None Periwound Skin Texture: Excoriation: No Excoriation: No Callus: Yes Induration: No Induration: No Excoriation: No Callus: No Callus: No Induration: No Crepitus: No Crepitus: No Crepitus: No Rash: No Rash: No Rash: No Scarring: No Scarring: No Scarring: No Periwound Skin Moisture: Maceration: No Maceration: No Maceration: No Dry/Scaly: No Dry/Scaly: No Dry/Scaly: No Periwound Skin Color: Atrophie Blanche: No Atrophie Blanche: No Atrophie Blanche: No Cyanosis: No Cyanosis: No Cyanosis: No Ecchymosis: No Ecchymosis: No Ecchymosis: No Erythema: No Erythema: No Erythema: No Hemosiderin Staining: No Hemosiderin Staining: No Hemosiderin Staining: No Mottled: No Mottled: No Mottled: No Pallor: No Pallor: No Pallor: No Rubor: No Rubor: No Rubor: No Temperature: No Abnormality No Abnormality No  Abnormality Tenderness on Palpation: No No No Wound Preparation: Ulcer Cleansing: Ulcer Cleansing: Ulcer Cleansing: Rinsed/Irrigated with Saline Rinsed/Irrigated with Saline Rinsed/Irrigated with Saline Topical Anesthetic Applied: Topical Anesthetic Applied: Topical Anesthetic Applied: Other: lidocaine 4% Other: lidocaine 4% Other: lidocaine 4% Wound Number: 7 N/A N/A Photos: N/A N/A Wound Location: Left Toe Third N/A N/A Wounding Event: Gradually Appeared N/A N/A Primary Etiology: Diabetic Wound/Ulcer of the N/A N/A Lower Extremity Comorbid History: Anemia, Hypertension, Type II N/A N/A Diabetes, Osteoarthritis, Neuropathy, Seizure Disorder Date Acquired: 11/07/2017 N/A N/A Weeks of Treatment: 1 N/A N/A Wound Status: Open N/A N/A Pending Amputation on No N/A N/A Presentation: Measurements L x W x D 0.1x0.1x0.1 N/A N/A (cm) Area (cm) : 0.008 N/A N/A Volume (cm) : 0.001 N/A N/A % Reduction in Area: 74.20% N/A N/A % Reduction in Volume: 83.30% N/A N/A Classification: Grade 1 N/A N/A Exudate Amount: None Present N/A N/A TYJA, GORTNEY ([id number]) Exudate Type: N/A N/A N/A Exudate Color: N/A N/A N/A Wound Margin: Flat and Intact N/A N/A Granulation Amount: None Present (0%) N/A N/A Granulation Quality: N/A N/A N/A Necrotic Amount: None Present (0%) N/A N/A Exposed Structures: Fat Layer (Subcutaneous N/A N/A Tissue) Exposed: Yes Fascia: No Tendon: No Muscle: No Joint: No Bone: No Epithelialization: None N/A N/A Periwound Skin Texture: Callus: Yes N/A N/A Excoriation: No Induration: No Crepitus: No Rash: No Scarring: No Periwound Skin Moisture: Maceration: No N/A N/A Dry/Scaly: No Periwound Skin Color: Atrophie Blanche: No N/A N/A Cyanosis: No Ecchymosis: No Erythema: No Hemosiderin Staining: No Mottled: No Pallor: No Rubor: No Temperature: No Abnormality N/A N/A Tenderness on Palpation: No N/A N/A Wound Preparation: Ulcer Cleansing: N/A  N/A Rinsed/Irrigated with Saline Topical Anesthetic Applied: Other: lidocaine 4% Treatment Notes Electronic Signature(s) Signed: 12/09/2017 4:22:48 PM By: Roger Shelter Entered By: Roger Shelter on 12/08/2017 15:40:12 [name] ([id number]) -------------------------------------------------------------------------------- [place] Details Patient Name: [name] Date of Service: 12/08/2017 3:00 PM Medical Record Number: [id number] Patient Account Number: [id number] Date of Birth/Sex: 09/27/1985 (32 y.o. F) Treating RN: Roger Shelter Primary Care Suvan Stcyr: [name] Other Clinician: Referring Miko Markwood: [name] Treating Jud Fanguy/Extender: Melburn Hake, HOYT Weeks in Treatment: 1 Active Inactive ` Necrotic Tissue Nursing Diagnoses: Impaired tissue integrity related to necrotic/devitalized tissue Goals: Necrotic/devitalized tissue will be minimized in the wound bed Date Initiated: 12/01/2017 Target Resolution Date: 12/16/2017 Goal Status: Active Interventions: Assess  patient pain level pre-, during and post procedure and prior to discharge Treatment Activities: Apply topical anesthetic as ordered : 12/01/2017 Notes: ` Orientation to the Wound Care Program Nursing Diagnoses: Knowledge deficit related to the wound healing center program Goals: Patient/caregiver will verbalize understanding of the [place] Date Initiated: 12/01/2017 Target Resolution Date: 12/31/2017 Goal Status: Active Interventions: Provide education on orientation to the wound center Notes: ` Wound/Skin Impairment Nursing Diagnoses: Impaired tissue integrity Goals: Patient/caregiver will verbalize understanding of skin care regimen Date Initiated: 12/01/2017 Target Resolution Date: 12/31/2017 KEYLA, MILONE ([id number]) Goal Status: Active Interventions: Assess ulceration(s) every visit Notes: Electronic Signature(s) Signed: 12/09/2017  4:22:48 PM By: Roger Shelter Entered By: Roger Shelter on 12/08/2017 15:40:02 [name] ([id number]) -------------------------------------------------------------------------------- Pain Assessment Details Patient Name: [name] Date of Service: 12/08/2017 3:00 PM Medical Record Number: [id number] Patient Account Number: [id number] Date of Birth/Sex: Jun 05, 1985 (32 y.o. F) Treating RN: Roger Shelter Primary Care Bradan Congrove: [name] Other Clinician: Referring Aira Sallade: [name] Treating Charina Fons/Extender: STONE III, HOYT Weeks in Treatment: 1 Active Problems Location of Pain Severity and Description of Pain Patient Has Paino Yes Site Locations Rate the pain. Current Pain Level: 7 Pain Management and Medication Current Pain Management: Electronic Signature(s) Signed: 12/08/2017 4:55:29 PM By: Lorine Bears RCP, RRT, CHT Signed: 12/09/2017 4:22:48 PM By: Roger Shelter Entered By: Lorine Bears on 12/08/2017 15:12:09 [name] ([id number]) -------------------------------------------------------------------------------- Patient/Caregiver Education Details Patient Name: [name] Date of Service: 12/08/2017 3:00 PM Medical Record Number: [id number] Patient Account Number: [id number] Date of Birth/Gender: 12-01-1985 (32 y.o. F) Treating RN: Roger Shelter Primary Care Physician: [name] Other Clinician: Referring Physician: [name] Treating Physician/Extender: Sharalyn Ink in Treatment: 1 Education Assessment Education Provided To: Patient Education Topics Provided Wound/Skin Impairment: Handouts: Caring for Your Ulcer Methods: Explain/Verbal Responses: State content correctly Electronic Signature(s) Signed: 12/09/2017 4:22:48 PM By: Roger Shelter Entered By: Roger Shelter on 12/08/2017 15:46:00 [name]  ([id number]) -------------------------------------------------------------------------------- Wound Assessment Details Patient Name: [name] Date of Service: 12/08/2017 3:00 PM Medical Record Number: [id number] Patient Account Number: [id number] Date of Birth/Sex: 10/07/1985 (32 y.o. F) Treating RN: Secundino Ginger Primary Care Romey Mathieson: [name] Other Clinician: Referring Tonna Palazzi: [name] Treating Yael Coppess/Extender: STONE III, HOYT Weeks in Treatment: 1 Wound Status Wound Number: 4 Primary Diabetic Wound/Ulcer of the Lower Extremity Etiology: Wound Location: Left Toe Great Wound Open Wounding Event: Gradually Appeared Status: Date Acquired: 11/07/2017 Comorbid Anemia, Hypertension, Type II Diabetes, Weeks Of Treatment: 1 History: Osteoarthritis, Neuropathy, Seizure Disorder Clustered Wound: No Pending Amputation On Presentation Photos Photo Uploaded By: Secundino Ginger on 12/08/2017 15:33:44 Wound Measurements Length: (cm) 1.2 % Reduction Width: (cm) 2.3 % Reduction Depth: (cm) 0.3 Epitheliali Area: (cm) 2.168 Tunneling: Volume: (cm) 0.65 Underminin in Area: 20% in Volume: 20% zation: None No g: No Wound Description Classification: Grade 1 Foul Odor Wound Margin: Flat and Intact Slough/Fib Exudate Amount: Medium Exudate Type: Serous Exudate Color: amber After Cleansing: No rino Yes Wound Bed Granulation Amount: Large (67-100%) Exposed Structure Granulation Quality: Red Fascia Exposed: No Necrotic Amount: Small (1-33%) Fat Layer (Subcutaneous Tissue) Exposed: Yes Necrotic Quality: Adherent Slough Tendon Exposed: No Muscle Exposed: No Joint Exposed: No Bone Exposed: No Periwound Skin Texture Landrus, [place] S. ([id number]) Texture Color No Abnormalities Noted: No No Abnormalities Noted: No Callus: No Atrophie Blanche: No Crepitus: No Cyanosis: No Excoriation: No Ecchymosis: No Induration: No Erythema: No Rash: No Hemosiderin Staining:  No Scarring: No Mottled:  No Pallor: No Moisture Rubor: No No Abnormalities Noted: No Dry / Scaly: No Temperature / Pain Maceration: No Temperature: No Abnormality Wound Preparation Ulcer Cleansing: Rinsed/Irrigated with Saline Topical Anesthetic Applied: Other: lidocaine 4%, Electronic Signature(s) Signed: 12/08/2017 4:09:51 PM By: Secundino Ginger Entered By: Secundino Ginger on 12/08/2017 15:22:47 [name] ([id number]) -------------------------------------------------------------------------------- Wound Assessment Details Patient Name: [name] Date of Service: 12/08/2017 3:00 PM Medical Record Number: [id number] Patient Account Number: [id number] Date of Birth/Sex: [birth year] (32 y.o. F) Treating RN: Secundino Ginger Primary Care Sye Schroepfer: [name] Other Clinician: Referring Durwood Dittus: [name] Treating Duilio Heritage/Extender: STONE III, HOYT Weeks in Treatment: 1 Wound Status Wound Number: 5 Primary Diabetic Wound/Ulcer of the Lower Extremity Etiology: Wound Location: Left Toe Second - Medial Wound Open Wounding Event: Gradually Appeared Status: Date Acquired: 11/07/2017 Comorbid Anemia, Hypertension, Type II Diabetes, Weeks Of Treatment: 1 History: Osteoarthritis, Neuropathy, Seizure Disorder Clustered Wound: No Pending Amputation On Presentation Photos Wound Measurements Length: (cm) 0.6 Width: (cm) 0.2 Depth: (cm) 0.1 Area: (cm) 0.094 Volume: (cm) 0.009 % Reduction in Area: 70.1% % Reduction in Volume: 85.7% Epithelialization: None Tunneling: No Undermining: No Wound Description Classification: Grade 1 Foul Odo Wound Margin: Flat and Intact Slough/F Exudate Amount: Small r After Cleansing: No ibrino No Wound Bed Granulation Amount: None Present (0%) Exposed Structure Necrotic Amount: None Present (0%) Fascia Exposed: No Fat Layer (Subcutaneous Tissue) Exposed: Yes Tendon Exposed: No Muscle Exposed: No Joint Exposed: No Bone Exposed:  No Periwound Skin Texture Texture Color No Abnormalities Noted: No No Abnormalities Noted: No TIMI, REESER ([id number]) Callus: No Atrophie Blanche: No Crepitus: No Cyanosis: No Excoriation: No Ecchymosis: No Induration: No Erythema: No Rash: No Hemosiderin Staining: No Scarring: No Mottled: No Pallor: No Moisture Rubor: No No Abnormalities Noted: No Dry / Scaly: No Temperature / Pain Maceration: No Temperature: No Abnormality Wound Preparation Ulcer Cleansing: Rinsed/Irrigated with Saline Topical Anesthetic Applied: Other: lidocaine 4%, Electronic Signature(s) Signed: 12/09/2017 8:32:38 AM By: Roger Shelter Signed: 12/12/2017 4:09:24 PM By: Secundino Ginger Previous Signature: 12/08/2017 4:09:51 PM Version By: Secundino Ginger Entered By: Roger Shelter on 12/09/2017 08:32:38 [name] ([id number]) -------------------------------------------------------------------------------- Wound Assessment Details Patient Name: [name] Date of Service: 12/08/2017 3:00 PM Medical Record Number: [id number] Patient Account Number: [id number] Date of Birth/Sex: [birth year] (32 y.o. F) Treating RN: Secundino Ginger Primary Care Bode Pieper: [name] Other Clinician: Referring Chibuikem Thang: [name] Treating Vahe Pienta/Extender: STONE III, HOYT Weeks in Treatment: 1 Wound Status Wound Number: 6 Primary Diabetic Wound/Ulcer of the Lower Extremity Etiology: Wound Location: Left Toe Second - Distal Wound Open Wounding Event: Gradually Appeared Status: Date Acquired: 11/07/2017 Comorbid Anemia, Hypertension, Type II Diabetes, Weeks Of Treatment: 1 History: Osteoarthritis, Neuropathy, Seizure Disorder Clustered Wound: No Pending Amputation On Presentation Photos Wound Measurements Length: (cm) 0.1 Width: (cm) 0.1 Depth: (cm) 0.1 Area: (cm) 0.008 Volume: (cm) 0.001 % Reduction in Area: 0% % Reduction in Volume: 0% Epithelialization: None Tunneling: No Undermining:  No Wound Description Classification: Grade 1 Foul Odo Wound Margin: Flat and Intact Slough/F Exudate Amount: Small r After Cleansing: No ibrino No Wound Bed Granulation Amount: None Present (0%) Exposed Structure Necrotic Amount: None Present (0%) Fascia Exposed: No Fat Layer (Subcutaneous Tissue) Exposed: No Tendon Exposed: No Muscle Exposed: No Joint Exposed: No Bone Exposed: No Periwound Skin Texture Texture Color No Abnormalities Noted: No No Abnormalities Noted: No CECILA, SATCHER. ([id number]) Callus: Yes Atrophie Blanche: No Crepitus: No Cyanosis: No Excoriation: No Ecchymosis: No Induration: No Erythema: No Rash: No  Hemosiderin Staining: No Scarring: No Mottled: No Pallor: No Moisture Rubor: No No Abnormalities Noted: No Dry / Scaly: No Temperature / Pain Maceration: No Temperature: No Abnormality Wound Preparation Ulcer Cleansing: Rinsed/Irrigated with Saline Topical Anesthetic Applied: Other: lidocaine 4%, Electronic Signature(s) Signed: 12/09/2017 8:32:21 AM By: Roger Shelter Signed: 12/12/2017 4:09:24 PM By: Secundino Ginger Previous Signature: 12/08/2017 4:09:51 PM Version By: Secundino Ginger Entered By: Roger Shelter on 12/09/2017 08:32:21 [name] ([id number]) -------------------------------------------------------------------------------- Wound Assessment Details Patient Name: [name] Date of Service: 12/08/2017 3:00 PM Medical Record Number: [id number] Patient Account Number: [id number] Date of Birth/Sex: 1985-10-01 (32 y.o. F) Treating RN: Roger Shelter Primary Care Sidharth Leverette: [name] Other Clinician: Referring Crystal Ellwood: [name] Treating Chonda Baney/Extender: STONE III, HOYT Weeks in Treatment: 1 Wound Status Wound Number: 7 Primary Diabetic Wound/Ulcer of the Lower Extremity Etiology: Wound Location: Left Toe Third Wound Healed - Epithelialized Wounding Event: Gradually Appeared Status: Date Acquired:  11/07/2017 Comorbid Anemia, Hypertension, Type II Diabetes, Weeks Of Treatment: 1 History: Osteoarthritis, Neuropathy, Seizure Disorder Clustered Wound: No Photos Photo Uploaded By: Secundino Ginger on 12/08/2017 15:35:58 Wound Measurements Length: (cm) 0 % Red Width: (cm) 0 % Red Depth: (cm) 0 Epith Area: (cm) 0 Tunn Volume: (cm) 0 Unde uction in Area: 100% uction in Volume: 100% elialization: None eling: No rmining: No Wound Description Classification: Grade 1 Wound Margin: Flat and Intact Exudate Amount: None Present Foul Odor After Cleansing: No Slough/Fibrino Yes Wound Bed Granulation Amount: None Present (0%) Exposed Structure Necrotic Amount: None Present (0%) Fascia Exposed: No Fat Layer (Subcutaneous Tissue) Exposed: Yes Tendon Exposed: No Muscle Exposed: No Joint Exposed: No Bone Exposed: No Periwound Skin Texture Texture Color No Abnormalities Noted: No No Abnormalities Noted: No ANETHA, SLAGEL ([id number]) Callus: Yes Atrophie Blanche: No Crepitus: No Cyanosis: No Excoriation: No Ecchymosis: No Induration: No Erythema: No Rash: No Hemosiderin Staining: No Scarring: No Mottled: No Pallor: No Moisture Rubor: No No Abnormalities Noted: No Dry / Scaly: No Temperature / Pain Maceration: No Temperature: No Abnormality Wound Preparation Ulcer Cleansing: Rinsed/Irrigated with Saline Topical Anesthetic Applied: Other: lidocaine 4%, Electronic Signature(s) Signed: 12/09/2017 4:22:48 PM By: Roger Shelter Entered By: Roger Shelter on 12/08/2017 15:58:11 [name] ([id number]) -------------------------------------------------------------------------------- Vitals Details Patient Name: [name] Date of Service: 12/08/2017 3:00 PM Medical Record Number: [id number] Patient Account Number: [id number] Date of Birth/Sex: 12/11/1985 (32 y.o. F) Treating RN: Roger Shelter Primary Care Lionell Matuszak: [name] Other Clinician: Referring  Talitha Dicarlo: [name] Treating Lisett Dirusso/Extender: STONE III, HOYT Weeks in Treatment: 1 Vital Signs Time Taken: 15:10 Temperature (F): 98.9 Height (in): 66 Pulse (bpm): 104 Source: Stated Respiratory Rate (breaths/min): 18 Weight (lbs): 174 Blood Pressure (mmHg): 145/105 Source: Stated Reference Range: 80 - 120 mg / dl Body Mass Index (BMI): 28.1 Electronic Signature(s) Signed: 12/08/2017 4:55:29 PM By: Lorine Bears RCP, RRT, CHT Entered By: Lorine Bears on 12/08/2017 15:14:05

## 2017-12-15 ENCOUNTER — Emergency Department (HOSPITAL_COMMUNITY)
Admission: EM | Admit: 2017-12-15 | Discharge: 2017-12-15 | Disposition: A | Discharge status: Home / Self Care | Payer: Medicare Other | Attending: Emergency Medicine | Admitting: Emergency Medicine

## 2017-12-15 ENCOUNTER — Encounter (HOSPITAL_COMMUNITY): Payer: Self-pay | Admitting: Emergency Medicine

## 2017-12-15 ENCOUNTER — Other Ambulatory Visit: Payer: Self-pay

## 2017-12-15 ENCOUNTER — Emergency Department (HOSPITAL_COMMUNITY)
Admission: EM | Admit: 2017-12-15 | Discharge: 2017-12-15 | Disposition: A | Discharge status: Home / Self Care | Payer: Medicare Other | Source: Home / Self Care | Attending: Emergency Medicine | Admitting: Emergency Medicine

## 2017-12-15 DIAGNOSIS — E1022 Type 1 diabetes mellitus with diabetic chronic kidney disease: Secondary | ICD-10-CM | POA: Insufficient documentation

## 2017-12-15 DIAGNOSIS — N183 Chronic kidney disease, stage 3 (moderate): Secondary | ICD-10-CM | POA: Insufficient documentation

## 2017-12-15 DIAGNOSIS — L0201 Cutaneous abscess of face: Secondary | ICD-10-CM | POA: Diagnosis not present

## 2017-12-15 DIAGNOSIS — Z794 Long term (current) use of insulin: Secondary | ICD-10-CM | POA: Insufficient documentation

## 2017-12-15 DIAGNOSIS — H9201 Otalgia, right ear: Secondary | ICD-10-CM | POA: Insufficient documentation

## 2017-12-15 DIAGNOSIS — R51 Headache: Secondary | ICD-10-CM | POA: Diagnosis not present

## 2017-12-15 DIAGNOSIS — I129 Hypertensive chronic kidney disease with stage 1 through stage 4 chronic kidney disease, or unspecified chronic kidney disease: Secondary | ICD-10-CM

## 2017-12-15 DIAGNOSIS — N189 Chronic kidney disease, unspecified: Secondary | ICD-10-CM | POA: Insufficient documentation

## 2017-12-15 DIAGNOSIS — Z79899 Other long term (current) drug therapy: Secondary | ICD-10-CM

## 2017-12-15 DIAGNOSIS — G501 Atypical facial pain: Secondary | ICD-10-CM | POA: Diagnosis present

## 2017-12-15 DIAGNOSIS — H6001 Abscess of right external ear: Secondary | ICD-10-CM

## 2017-12-15 MED ORDER — SULFAMETHOXAZOLE-TRIMETHOPRIM 800-160 MG PO TABS
1.0000 | ORAL_TABLET | Freq: Once | ORAL | Status: DC
  Filled 2017-12-15: qty 1

## 2017-12-15 MED ORDER — CLINDAMYCIN HCL 150 MG PO CAPS: 300.0000 mg | ORAL_CAPSULE | Freq: Four times a day (QID) | ORAL | 0 refills | Status: DC

## 2017-12-15 MED ORDER — SULFAMETHOXAZOLE-TRIMETHOPRIM 800-160 MG PO TABS: 1.0000 | ORAL_TABLET | Freq: Two times a day (BID) | ORAL | 0 refills | Status: DC

## 2017-12-15 MED ORDER — HYDROCODONE-ACETAMINOPHEN 5-325 MG PO TABS: 1.0000 | ORAL_TABLET | ORAL | 0 refills | Status: DC | PRN

## 2017-12-15 MED ORDER — TRAMADOL HCL 50 MG PO TABS: 50.0000 mg | ORAL_TABLET | Freq: Four times a day (QID) | ORAL | 0 refills | Status: DC | PRN

## 2017-12-15 MED ORDER — PROBIOTIC 250 MG PO CAPS: 1.0000 | ORAL_CAPSULE | Freq: Every day | ORAL | 0 refills | Status: DC

## 2017-12-15 NOTE — ED Provider Notes (Signed)
[facility] DEPT Provider Note   CSN: [id number] Arrival date & time: 12/15/17  0550     History   Chief Complaint Chief Complaint  Patient presents with  . Otalgia    HPI [name] is a 32 y.o. female with history of kidney transplant, hypertension, hyperlipidemia, XXX syndrome, pseudoseizures who presents with 2-day history of right ear pain.  Patient has had swelling around her ear.  She has been taking Percocet at home with some relief.  Patient was seen prior to arrival at [facility] and eloped before paperwork was given.  Patient denies any fevers.  HPI  Past Medical History:  Diagnosis Date  . Chronic kidney disease   . Diabetes mellitus    Pt reports diagnosis in June 2011  . Hyperlipidemia   . Hypertension   . Kidney transplant recipient   . 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 Pyelonephritis 06/23/2014  . UTI (urinary tract infection) 01/09/2015  . 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 Chronic pain 11/11/2017  . Gastroparesis   . AKI (acute kidney injury) ([place]) 07/13/2017  . Constipation 07/13/2017  . Hematemesis 07/02/2017  . Chronic cholecystitis 06/29/2017  . Type 1 diabetes mellitus with complication, uncontrolled ([place]) 05/25/2015  . Nausea & vomiting 01/09/2015  . Acute renal failure superimposed on stage 3 chronic kidney disease ([place]) 01/09/2015  . Renal transplant recipient   . Immunosuppressed status ([place])   . 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 CHRONIC KIDNEY DISEASE STAGE II (MILD) 01/04/2010  . OVARIAN FAILURE, PREMATURE 03/11/2009  . 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 SECONDARY HYPERPARATHYROIDISM 12/05/2007  . OBESITY 09/25/2007  . Anemia due to chronic kidney disease 09/25/2007  . LEARNING DISABILITY 09/25/2007    . Essential hypertension, benign 09/25/2007  . KIDNEY TRANSPLANTATION, HX OF 09/25/2007    Past Surgical History:  Procedure Laterality Date  . ARTERIOVENOUS GRAFT PLACEMENT Bilateral    "neither work" (10/24/2017)  . CHOLECYSTECTOMY N/A 06/30/2017   Procedure: LAPAROSCOPIC CHOLECYSTECTOMY WITH INTRAOPERATIVE CHOLANGIOGRAM;  Surgeon: Excell Seltzer, MD;  Location: [facility] ORS;  Service: General;  Laterality: N/A;  . ESOPHAGOGASTRODUODENOSCOPY (EGD) WITH PROPOFOL N/A 07/04/2017   Procedure: ESOPHAGOGASTRODUODENOSCOPY (EGD) WITH PROPOFOL;  Surgeon: Clarene Essex, MD;  Location: [facility];  Service: Endoscopy;  Laterality: N/A;  . KIDNEY TRANSPLANT  2007  . PARATHYROIDECTOMY  ?2012   "3/4 removed" (10/24/2017)  . RENAL BIOPSY Bilateral 2003     OB History   None      Home Medications    Prior to Admission medications   Medication Sig Start Date End Date Taking? Authorizing Provider  ACCU-CHEK SOFTCLIX LANCETS lancets Use to check blood sugar 4 times per day. 12/29/15   Renato Shin, MD  acetaminophen (TYLENOL) 500 MG tablet Take 1,000 mg by mouth daily as needed for moderate pain.     [provider]  calcium elemental as carbonate (BARIATRIC TUMS ULTRA) 400 MG chewable tablet Chew 1,000 mg by mouth 3 (three) times daily as needed for heartburn.     [provider]  cetirizine (ZYRTEC) 10 MG tablet Take 10 mg by mouth daily as needed for allergies.     [provider]  clindamycin (CLEOCIN) 150 MG capsule Take 2 capsules (300 mg total) by mouth  4 (four) times daily. 12/15/17   Renaldo Gornick, Bea Graff, PA-C  cloNIDine (CATAPRES - DOSED IN MG/24 HR) 0.1 mg/24hr patch Place 1 patch (0.1 mg total) onto the skin once a week. Every Wednesday Patient not taking: Reported on 11/11/2017 11/02/17   Roxan Hockey, MD  dicyclomine (BENTYL) 20 MG tablet Take 1 tablet (20 mg total) by mouth 2 (two) times daily. Patient not taking: Reported on 11/11/2017 10/22/17   Caccavale,  Sophia, PA-C  famotidine (PEPCID) 40 MG tablet Take 1 tablet (40 mg total) by mouth daily. Can take up to 2 x day (every 12 hrs) Patient taking differently: Take 40 mg by mouth daily.  07/15/17   Debbe Odea, MD  fluticasone (FLONASE) 50 MCG/ACT nasal spray Place 2 sprays into both nostrils daily. Patient taking differently: Place 2 sprays into both nostrils daily as needed for allergies.  07/20/15   Muthersbaugh, Jarrett Soho, PA-C  gabapentin (NEURONTIN) 100 MG capsule Take 200 mg by mouth daily as needed (for feet).  11/12/16   [provider]  hydrOXYzine (ATARAX/VISTARIL) 50 MG tablet Take 50 mg by mouth 2 (two) times daily as needed for itching.     [provider]  insulin aspart (NOVOLOG FLEXPEN) 100 UNIT/ML FlexPen Inject 15 Units into the skin 3 (three) times daily with meals. 81-150=10 units 51-200=11 units 201-250=12 units 251-300=13 units 301-350=14 units >351=15 u Per sliding scale    [provider]  insulin degludec (TRESIBA) 100 UNIT/ML SOPN FlexTouch Pen Inject 0.2 mLs (20 Units total) into the skin at bedtime. Patient taking differently: Inject 20 Units into the skin every morning.  10/26/17   Roxan Hockey, MD  loperamide (IMODIUM) 2 MG capsule Take 2 mg by mouth as needed for diarrhea or loose stools.     [provider]  metoCLOPramide (REGLAN) 10 MG tablet Take 1 tablet (10 mg total) by mouth every 8 (eight) hours as needed for nausea. 10/26/17   Roxan Hockey, MD  mycophenolate (MYFORTIC) 360 MG TBEC Take 720 mg by mouth 2 (two) times daily.     [provider]  ondansetron (ZOFRAN ODT) 4 MG disintegrating tablet Take 1 tablet (4 mg total) by mouth every 4 (four) hours as needed for nausea or vomiting. 10/26/17   Roxan Hockey, MD  OVER THE COUNTER MEDICATION Take 1 Can by mouth 2 (two) times daily. Vitamin supplement drink    [provider]  pantoprazole (PROTONIX) 40 MG tablet Take 1 tablet (40 mg total) by mouth  daily. 11/15/17   Elgergawy, Silver Huguenin, MD  potassium chloride (KLOR-CON) 20 MEQ packet Take 40 mEq by mouth daily for 5 days. Patient not taking: Reported on 11/11/2017 10/26/17 10/31/17  Roxan Hockey, MD  predniSONE (DELTASONE) 10 MG tablet Take 3 tablets (30 mg total) by mouth daily with breakfast. 11/14/17   Elgergawy, Silver Huguenin, MD  prochlorperazine (COMPAZINE) 25 MG suppository Place 1 suppository (25 mg total) rectally every 12 (twelve) hours as needed for nausea or vomiting. 10/26/17   Roxan Hockey, MD  promethazine (PHENERGAN) 25 MG tablet Take 1 tablet (25 mg total) by mouth every 6 (six) hours as needed for nausea or vomiting. 10/20/17   Lawyer, Harrell Gave, PA-C  Saccharomyces boulardii (PROBIOTIC) 250 MG CAPS Take 1 capsule by mouth daily. 12/15/17   Frederica Kuster, PA-C  simvastatin (ZOCOR) 20 MG tablet Take 20 mg by mouth every Monday, Wednesday, and Friday.     [provider]  tacrolimus (PROGRAF) 1 MG capsule Take 3 mg by  mouth 2 (two) times daily.     [provider]  traMADol (ULTRAM) 50 MG tablet Take 1 tablet (50 mg total) by mouth every 6 (six) hours as needed. 12/15/17   Orpah Greek, MD    Family History Family History  Problem Relation Age of Onset  . Arthritis Mother   . Diabetes Mother   . Hypertension Mother     Social History Social History   Tobacco Use  . Smoking status: Never Smoker  . Smokeless tobacco: Never Used  Substance Use Topics  . Alcohol use: No  . Drug use: No     Allergies   Benadryl [diphenhydramine-zinc acetate]; Motrin [ibuprofen]; Banana; Diphenhydramine hcl; Doxycycline; Iron dextran; and Shellfish allergy   Review of Systems Review of Systems  Constitutional: Negative for fever.  HENT: Positive for ear pain. Negative for congestion and sore throat.   Respiratory: Negative for cough.   Skin: Positive for wound.     Physical Exam Updated Vital Signs BP (!) 171/117 (BP Location: Left Arm)   Pulse  97   Temp 98.7 F (37.1 C) (Oral)   Resp 18   Ht 5\' 6"  (1.676 m)   Wt 78.9 kg   SpO2 100%   BMI 28.08 kg/m   Physical Exam  Constitutional: She appears well-developed and well-nourished. No distress.  HENT:  Head: Normocephalic and atraumatic.  Right Ear: Tympanic membrane normal.  Left Ear: Tympanic membrane normal.  Ears:  Mouth/Throat: Oropharynx is clear and moist. No oropharyngeal exudate.  Some periauricular adenopathy  Eyes: Pupils are equal, round, and reactive to light. Conjunctivae are normal. Right eye exhibits no discharge. Left eye exhibits no discharge. No scleral icterus.  Neck: Normal range of motion. Neck supple. No thyromegaly present.  Cardiovascular: Normal rate, regular rhythm, normal heart sounds and intact distal pulses. Exam reveals no gallop and no friction rub.  No murmur heard. Pulmonary/Chest: Effort normal and breath sounds normal. No stridor. No respiratory distress. She has no wheezes. She has no rales.  Abdominal: Soft. Bowel sounds are normal. She exhibits no distension. There is no tenderness. There is no rebound and no guarding.  Musculoskeletal: She exhibits no edema.  Lymphadenopathy:    She has no cervical adenopathy.  Neurological: She is alert. Coordination normal.  Skin: Skin is warm and dry. No rash noted. She is not diaphoretic. No pallor.  Psychiatric: She has a normal mood and affect.  Nursing note and vitals reviewed.    ED Treatments / Results  Labs (all labs ordered are listed, but only abnormal results are displayed) Labs Reviewed - No data to display  EKG None  Radiology No results found.  Procedures Procedures (including critical care time)  Medications Ordered in ED Medications - No data to display   Initial Impression / Assessment and Plan / ED Course  I have reviewed the triage vital signs and the nursing notes.  Pertinent labs & imaging results that were available during my care of the patient were  reviewed by me and considered in my medical decision making (see chart for details).     Patient with a draining abscess to the cartilage of the right ear.  Patient prescribed clindamycin at [facility] just prior to arrival.  She reports she did not receive paperwork.  Will discharge home with clindamycin and probiotics.  TM is clear.  No signs of perichondritis at this time.  Will give ENT referral for drainage, if needed, considering location and the patient status post  kidney transplant.  Wound check in 1 to 2 days by PCP as planned.  Patient reports her blood pressure has been elevated recently due to changing to a clonidine patch.  She is planning on seeing her PCP tomorrow for further management of this.  Return precautions discussed.  Patient understands and agrees with plan.  Patient discharged in satisfactory condition.  Final Clinical Impressions(s) / ED Diagnoses   Final diagnoses:  Cutaneous abscess of face    ED Discharge Orders         Ordered    clindamycin (CLEOCIN) 150 MG capsule  4 times daily     12/15/17 1540    Saccharomyces boulardii (PROBIOTIC) 250 MG CAPS  Daily     12/15/17 [place], Madlyn Crosby M, PA-C 12/15/17 0867    Palumbo, April, MD 12/15/17 [phone]

## 2017-12-15 NOTE — ED Triage Notes (Signed)
Patient complaining of right ear pain that has been hurting for 2 days.

## 2017-12-15 NOTE — ED Provider Notes (Addendum)
[facility] EMERGENCY DEPARTMENT Provider Note   CSN: [id number] Arrival date & time: 12/15/17  0428     History   Chief Complaint Chief Complaint  Patient presents with  . Otalgia    HPI [name] is a 32 y.o. female.  Patient complains of right sided facial, head pain as well as right ear pain.  Symptoms began several days ago.  It started as itching around the right ear.  Since then the area has become painful, pain radiates from the area of the ear to the posterior scalp and to her forehead.     Past Medical History:  Diagnosis Date  . Chronic kidney disease   . Diabetes mellitus    Pt reports diagnosis in June 2011  . Hyperlipidemia   . Hypertension   . Kidney transplant recipient   . 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 Pyelonephritis 06/23/2014  . UTI (urinary tract infection) 01/09/2015  . 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 Chronic pain 11/11/2017  . Gastroparesis   . AKI (acute kidney injury) ([place]) 07/13/2017  . Constipation 07/13/2017  . Hematemesis 07/02/2017  . Chronic cholecystitis 06/29/2017  . Type 1 diabetes mellitus with complication, uncontrolled ([place]) 05/25/2015  . Nausea & vomiting 01/09/2015  . Acute renal failure superimposed on stage 3 chronic kidney disease ([place]) 01/09/2015  . Renal transplant recipient   . Immunosuppressed status ([place])   . 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 CHRONIC KIDNEY DISEASE STAGE II (MILD) 01/04/2010  . OVARIAN FAILURE, PREMATURE 03/11/2009  . 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 SECONDARY HYPERPARATHYROIDISM 12/05/2007  . OBESITY 09/25/2007  . Anemia due to chronic kidney disease 09/25/2007  . LEARNING DISABILITY 09/25/2007  . Essential hypertension, benign 09/25/2007  . KIDNEY  TRANSPLANTATION, HX OF 09/25/2007    Past Surgical History:  Procedure Laterality Date  . ARTERIOVENOUS GRAFT PLACEMENT Bilateral    "neither work" (10/24/2017)  . CHOLECYSTECTOMY N/A 06/30/2017   Procedure: LAPAROSCOPIC CHOLECYSTECTOMY WITH INTRAOPERATIVE CHOLANGIOGRAM;  Surgeon: Excell Seltzer, MD;  Location: [facility];  Service: General;  Laterality: N/A;  . ESOPHAGOGASTRODUODENOSCOPY (EGD) WITH PROPOFOL N/A 07/04/2017   Procedure: ESOPHAGOGASTRODUODENOSCOPY (EGD) WITH PROPOFOL;  Surgeon: Clarene Essex, MD;  Location: [facility];  Service: Endoscopy;  Laterality: N/A;  . KIDNEY TRANSPLANT  2007  . PARATHYROIDECTOMY  ?2012   "3/4 removed" (10/24/2017)  . RENAL BIOPSY Bilateral 2003     OB History   None      Home Medications    Prior to Admission medications   Medication Sig Start Date End Date Taking? Authorizing Provider  ACCU-CHEK SOFTCLIX LANCETS lancets Use to check blood sugar 4 times per day. 12/29/15   Renato Shin, MD  acetaminophen (TYLENOL) 500 MG tablet Take 1,000 mg by mouth daily as needed for moderate pain.     [provider]  calcium elemental as carbonate (BARIATRIC TUMS ULTRA) 400 MG chewable tablet Chew 1,000 mg by mouth 3 (three) times daily as needed for heartburn.     [provider]  cetirizine (ZYRTEC) 10 MG tablet Take 10 mg by mouth daily as needed for allergies.     [provider]  cloNIDine (CATAPRES - DOSED IN MG/24 HR) 0.1 mg/24hr patch Place 1 patch (0.1 mg total) onto  the skin once a week. Every Wednesday Patient not taking: Reported on 11/11/2017 11/02/17   Roxan Hockey, MD  dicyclomine (BENTYL) 20 MG tablet Take 1 tablet (20 mg total) by mouth 2 (two) times daily. Patient not taking: Reported on 11/11/2017 10/22/17   Caccavale, Sophia, PA-C  famotidine (PEPCID) 40 MG tablet Take 1 tablet (40 mg total) by mouth daily. Can take up to 2 x day (every 12 hrs) Patient taking differently: Take 40 mg by mouth daily.  07/15/17    Debbe Odea, MD  fluticasone (FLONASE) 50 MCG/ACT nasal spray Place 2 sprays into both nostrils daily. Patient taking differently: Place 2 sprays into both nostrils daily as needed for allergies.  07/20/15   Muthersbaugh, Jarrett Soho, PA-C  gabapentin (NEURONTIN) 100 MG capsule Take 200 mg by mouth daily as needed (for feet).  11/12/16   [provider]  hydrOXYzine (ATARAX/VISTARIL) 50 MG tablet Take 50 mg by mouth 2 (two) times daily as needed for itching.     [provider]  insulin aspart (NOVOLOG FLEXPEN) 100 UNIT/ML FlexPen Inject 15 Units into the skin 3 (three) times daily with meals. 81-150=10 units 51-200=11 units 201-250=12 units 251-300=13 units 301-350=14 units >351=15 u Per sliding scale    [provider]  insulin degludec (TRESIBA) 100 UNIT/ML SOPN FlexTouch Pen Inject 0.2 mLs (20 Units total) into the skin at bedtime. Patient taking differently: Inject 20 Units into the skin every morning.  10/26/17   Roxan Hockey, MD  loperamide (IMODIUM) 2 MG capsule Take 2 mg by mouth as needed for diarrhea or loose stools.     [provider]  metoCLOPramide (REGLAN) 10 MG tablet Take 1 tablet (10 mg total) by mouth every 8 (eight) hours as needed for nausea. 10/26/17   Roxan Hockey, MD  mycophenolate (MYFORTIC) 360 MG TBEC Take 720 mg by mouth 2 (two) times daily.     [provider]  ondansetron (ZOFRAN ODT) 4 MG disintegrating tablet Take 1 tablet (4 mg total) by mouth every 4 (four) hours as needed for nausea or vomiting. 10/26/17   Roxan Hockey, MD  OVER THE COUNTER MEDICATION Take 1 Can by mouth 2 (two) times daily. Vitamin supplement drink    [provider]  pantoprazole (PROTONIX) 40 MG tablet Take 1 tablet (40 mg total) by mouth daily. 11/15/17   Elgergawy, Silver Huguenin, MD  potassium chloride (KLOR-CON) 20 MEQ packet Take 40 mEq by mouth daily for 5 days. Patient not taking: Reported on 11/11/2017 10/26/17 10/31/17  Roxan Hockey, MD  predniSONE (DELTASONE) 10 MG tablet Take 3 tablets (30 mg total) by mouth daily with breakfast. 11/14/17   Elgergawy, Silver Huguenin, MD  prochlorperazine (COMPAZINE) 25 MG suppository Place 1 suppository (25 mg total) rectally every 12 (twelve) hours as needed for nausea or vomiting. 10/26/17   Roxan Hockey, MD  promethazine (PHENERGAN) 25 MG tablet Take 1 tablet (25 mg total) by mouth every 6 (six) hours as needed for nausea or vomiting. 10/20/17   Lawyer, Harrell Gave, PA-C  simvastatin (ZOCOR) 20 MG tablet Take 20 mg by mouth every Monday, Wednesday, and Friday.     [provider]  tacrolimus (PROGRAF) 1 MG capsule Take 3 mg by mouth 2 (two) times daily.     [provider]    Family History Family History  Problem Relation Age of Onset  . Arthritis Mother   . Diabetes Mother   . Hypertension Mother     Social History Social History  Tobacco Use  . Smoking status: Never Smoker  . Smokeless tobacco: Never Used  Substance Use Topics  . Alcohol use: No  . Drug use: No     Allergies   Benadryl [diphenhydramine-zinc acetate]; Motrin [ibuprofen]; Banana; Diphenhydramine hcl; Doxycycline; Iron dextran; and Shellfish allergy   Review of Systems Review of Systems  HENT: Positive for ear pain.   Neurological: Positive for headaches.  All other systems reviewed and are negative.    Physical Exam Updated Vital Signs BP (!) 153/114 (BP Location: Right Arm)   Pulse (!) 104   Temp 98.8 F (37.1 C) (Oral)   Resp 17   SpO2 100%   Physical Exam  Constitutional: She is oriented to person, place, and time. She appears well-developed and well-nourished.  HENT:  Right Ear: Tympanic membrane and ear canal normal.  Ears:  Eyes: Pupils are equal, round, and reactive to light.  Cardiovascular: Normal rate.  Pulmonary/Chest: Effort normal.  Musculoskeletal: Normal range of motion.  Neurological: She is alert and oriented to person, place, and time. No  cranial nerve deficit.  Skin:  Swelling and erythema without induration or fluctuance of helicis crus portion of outer ear with preauricular swelling     ED Treatments / Results  Labs (all labs ordered are listed, but only abnormal results are displayed) Labs Reviewed - No data to display  EKG None  Radiology No results found.  Procedures Procedures (including critical care time)  Medications Ordered in ED Medications - No data to display   Initial Impression / Assessment and Plan / ED Course  I have reviewed the triage vital signs and the nursing notes.  Pertinent labs & imaging results that were available during my care of the patient were reviewed by me and considered in my medical decision making (see chart for details).     She presents with right ear pain with radiation to the posterior scalp and forehead area.  Examination reveals erythema, swelling, tenderness over the preauricular region of the right ear as well as helicis crus portion of the ear.  No obvious drainable fluid collection at this time.  We will treat with warm compresses, Bactrim.  Blood pressure noted to be elevated.  She has a history of hypertension.  She was recently transitioned to clonidine patch.  She is asymptomatic.  She will follow-up with Dr. Lorrene Reid, her nephrologist for repeat blood pressure check.  She will call today.   Addendum: Patient reports that she cannot take Bactrim but does not know why.  She reports stomach upset with hydrocodone.  Switched to clindamycin and Ultram. Final Clinical Impressions(s) / ED Diagnoses   Final diagnoses:  Cutaneous abscess of face    ED Discharge Orders    None       Orpah Greek, MD 12/15/17 3559    Orpah Greek, MD 12/15/17 [phone]

## 2017-12-15 NOTE — ED Triage Notes (Signed)
Patient reports right earache onset 2 days ago , denies injury , no drainage or hearing loss , pain radiating to right side of head , no fever or chills .

## 2017-12-15 NOTE — ED Notes (Signed)
Pt not happy with discharge medications, requesting percocet and amoxicillin although she reports being allergic to penicillin. Pt states that she will not accept discharge paperwork and eloped from ED before discharge instructions given.

## 2017-12-15 NOTE — Discharge Instructions (Signed)
Take clindamycin with food until completed.  Recommend taking this medication with a probiotic daily.  Use warm compresses to the area to help continue drainage.  You may need to follow-up with the ear, nose, throat doctor, Dr. Blenda Nicely, for drainage of the abscess on your ear.  Please return the emergency department if you develop any new or worsening symptoms including increasing swelling, redness, persistent fever over 100.4.  Please see your doctor tomorrow for recheck of the area.

## 2017-12-21 ENCOUNTER — Encounter: Payer: Medicare Other | Attending: Internal Medicine | Admitting: Internal Medicine

## 2017-12-21 DIAGNOSIS — Z940 Kidney transplant status: Secondary | ICD-10-CM | POA: Insufficient documentation

## 2017-12-21 DIAGNOSIS — L97522 Non-pressure chronic ulcer of other part of left foot with fat layer exposed: Secondary | ICD-10-CM | POA: Diagnosis not present

## 2017-12-21 DIAGNOSIS — Z794 Long term (current) use of insulin: Secondary | ICD-10-CM | POA: Diagnosis not present

## 2017-12-21 DIAGNOSIS — E11621 Type 2 diabetes mellitus with foot ulcer: Secondary | ICD-10-CM | POA: Insufficient documentation

## 2017-12-22 ENCOUNTER — Inpatient Hospital Stay (HOSPITAL_COMMUNITY): Admission: RE | Admit: 2017-12-22 | Payer: Medicare Other | Source: Ambulatory Visit

## 2017-12-22 DIAGNOSIS — T8691 Unspecified transplanted organ and tissue rejection: Secondary | ICD-10-CM | POA: Diagnosis not present

## 2017-12-22 DIAGNOSIS — Z940 Kidney transplant status: Secondary | ICD-10-CM | POA: Diagnosis not present

## 2017-12-22 DIAGNOSIS — N189 Chronic kidney disease, unspecified: Secondary | ICD-10-CM | POA: Diagnosis not present

## 2017-12-22 DIAGNOSIS — N059 Unspecified nephritic syndrome with unspecified morphologic changes: Secondary | ICD-10-CM | POA: Diagnosis not present

## 2017-12-22 DIAGNOSIS — H60391 Other infective otitis externa, right ear: Secondary | ICD-10-CM | POA: Diagnosis not present

## 2017-12-22 DIAGNOSIS — D631 Anemia in chronic kidney disease: Secondary | ICD-10-CM | POA: Diagnosis not present

## 2017-12-22 DIAGNOSIS — K219 Gastro-esophageal reflux disease without esophagitis: Secondary | ICD-10-CM | POA: Diagnosis not present

## 2017-12-22 DIAGNOSIS — T8619 Other complication of kidney transplant: Secondary | ICD-10-CM | POA: Diagnosis not present

## 2017-12-22 DIAGNOSIS — L97529 Non-pressure chronic ulcer of other part of left foot with unspecified severity: Secondary | ICD-10-CM | POA: Diagnosis not present

## 2017-12-22 DIAGNOSIS — E11621 Type 2 diabetes mellitus with foot ulcer: Secondary | ICD-10-CM | POA: Diagnosis not present

## 2017-12-22 DIAGNOSIS — E139 Other specified diabetes mellitus without complications: Secondary | ICD-10-CM | POA: Diagnosis not present

## 2017-12-22 DIAGNOSIS — I10 Essential (primary) hypertension: Secondary | ICD-10-CM | POA: Diagnosis not present

## 2017-12-24 NOTE — Progress Notes (Signed)
Valerie, [name] ([id number]) Visit Report for 12/21/2017 Arrival Information Details Patient Name: [name], [name] Date of Service: 12/21/2017 3:00 PM Medical Record Number: [id number] Patient Account Number: [id number] Date of Birth/Sex: 1985-09-24 (32 y.o. F) Treating RN: Montey Hora Primary Care Jaqlyn Gruenhagen: [name] Other Clinician: Referring Nachman Sundt: [name] Treating Laith Antonelli/Extender: Tito Dine in Treatment: 2 Visit Information History Since Last Visit Added or deleted any medications: No Patient Arrived: Ambulatory Any new allergies or adverse reactions: No Arrival Time: 15:04 Had a fall or experienced change in No Accompanied By: self activities of daily living that may affect Transfer Assistance: None risk of falls: Patient Identification Verified: Yes Signs or symptoms of abuse/neglect since last visito No Secondary Verification Process Yes Hospitalized since last visit: No Completed: Implantable device outside of the clinic excluding No Patient Has Alerts: Yes cellular tissue based products placed in the center Patient Alerts: ABI 02/14/17 L 1.07 R since last visit: 1.11 Has Dressing in Place as Prescribed: Yes Pain Present Now: No Electronic Signature(s) Signed: 12/21/2017 5:27:24 PM By: Montey Hora Entered By: Montey Hora on 12/21/2017 15:07:03 [name] ([id number]) -------------------------------------------------------------------------------- Encounter Discharge Information Details Patient Name: [name] Date of Service: 12/21/2017 3:00 PM Medical Record Number: [id number] Patient Account Number: [id number] Date of Birth/Sex: 1986/01/22 (32 y.o. F) Treating RN: Roger Shelter Primary Care Zyanya Glaza: [name] Other Clinician: Referring Kyngston Pickelsimer: [name] Treating Cherly Erno/Extender: Tito Dine in Treatment: 2 Encounter Discharge Information Items Discharge Condition: Stable Ambulatory  Status: Ambulatory Discharge Destination: Home Transportation: Private Auto Schedule Follow-up Appointment: No Clinical Summary of Care: Post Procedure Vitals: Temperature (F): 98.21 Pulse (bpm): 104 Respiratory Rate (breaths/min): 16 Blood Pressure (mmHg): 146/98 Electronic Signature(s) Signed: 12/21/2017 4:55:13 PM By: Roger Shelter Entered By: Roger Shelter on 12/21/2017 15:45:00 [name] ([id number]) -------------------------------------------------------------------------------- Lower Extremity Assessment Details Patient Name: [name] Date of Service: 12/21/2017 3:00 PM Medical Record Number: [id number] Patient Account Number: [id number] Date of Birth/Sex: 1986/03/05 (32 y.o. F) Treating RN: Montey Hora Primary Care Jamita Mckelvin: [name] Other Clinician: Referring Lekeya Rollings: [name] Treating Chantrice Hagg/Extender: Tito Dine in Treatment: 2 Edema Assessment Assessed: [Left: No] [Right: No] Edema: [Left: N] [Right: o] Calf Left: Right: Point of Measurement: 35 cm From Medial Instep 37 cm cm Ankle Left: Right: Point of Measurement: 11 cm From Medial Instep 22 cm cm Vascular Assessment Claudication: Claudication Assessment [Left:None] Pulses: Dorsalis Pedis Palpable: [Left:Yes] Posterior Tibial Extremity colors, hair growth, and conditions: Extremity Color: [Left:Normal] Hair Growth on Extremity: [Left:No] Temperature of Extremity: [Left:Warm] Capillary Refill: [Left:< 3 seconds] Toe Nail Assessment Left: Right: Thick: Yes Discolored: Yes Deformed: Yes Improper Length and Hygiene: Yes Electronic Signature(s) Signed: 12/21/2017 5:27:24 PM By: Montey Hora Entered By: Montey Hora on 12/21/2017 15:13:11 [name] ([id number]) -------------------------------------------------------------------------------- Multi Wound Chart Details Patient Name: [name] Date of Service: 12/21/2017 3:00 PM Medical Record  Number: [id number] Patient Account Number: [id number] Date of Birth/Sex: 09-Jan-1986 (32 y.o. F) Treating RN: Cornell Barman Primary Care Albin Duckett: [name] Other Clinician: Referring Jiya Kissinger: [name] Treating Kanen Mottola/Extender: Tito Dine in Treatment: 2 Vital Signs Height(in): 66 Pulse(bpm): 104 Weight(lbs): 174 Blood Pressure(mmHg): 146/98 Body Mass Index(BMI): 28 Temperature(F): 98.2 Respiratory Rate 16 (breaths/min): Photos: Wound Location: Left Toe Great Left Toe Second - Medial Left Toe Second - Distal Wounding Event: Gradually Appeared Gradually Appeared Gradually Appeared Primary Etiology: Diabetic Wound/Ulcer of the Diabetic Wound/Ulcer of the Diabetic Wound/Ulcer of the Lower Extremity Lower Extremity Lower Extremity  Comorbid History: Anemia, Hypertension, Type II Anemia, Hypertension, Type II Anemia, Hypertension, Type II Diabetes, Osteoarthritis, Diabetes, Osteoarthritis, Diabetes, Osteoarthritis, Neuropathy, Seizure Disorder Neuropathy, Seizure Disorder Neuropathy, Seizure Disorder Date Acquired: 11/07/2017 11/07/2017 11/07/2017 Weeks of Treatment: 2 2 2  Wound Status: Open Open Open Wound Recurrence: No No No Pending Amputation on Yes Yes Yes Presentation: Measurements L x W x D 0.7x1.3x0.2 0.1x0.1x0.1 0.1x0.1x0.1 (cm) Area (cm) : 0.715 0.008 0.008 Volume (cm) : 0.143 0.001 0.001 % Reduction in Area: 73.60% 97.50% 0.00% % Reduction in Volume: 82.40% 98.40% 0.00% Classification: Grade 1 Grade 1 Grade 1 Exudate Amount: Medium None Present None Present Exudate Type: Serous N/A N/A Exudate Color: amber N/A N/A Wound Margin: Flat and Intact Flat and Intact Flat and Intact Granulation Amount: Large (67-100%) None Present (0%) None Present (0%) Granulation Quality: Red N/A N/A Necrotic Amount: Small (1-33%) None Present (0%) None Present (0%) Exposed Structures: Fat Layer (Subcutaneous Fat Layer (Subcutaneous Fascia: No Tissue) Exposed:  Yes Tissue) Exposed: Yes Fat Layer (Subcutaneous KELDA, AZAD. ([id number]) Fascia: No Fascia: No Tissue) Exposed: No Tendon: No Tendon: No Tendon: No Muscle: No Muscle: No Muscle: No Joint: No Joint: No Joint: No Bone: No Bone: No Bone: No Epithelialization: None None None Debridement: N/A N/A N/A Pain Control: N/A N/A N/A Tissue Debrided: N/A N/A N/A Level: N/A N/A N/A Debridement Area (sq cm): N/A N/A N/A Instrument: N/A N/A N/A Bleeding: N/A N/A N/A Hemostasis Achieved: N/A N/A N/A Debridement Treatment N/A N/A N/A Response: Post Debridement N/A N/A N/A Measurements L x W x D (cm) Post Debridement Volume: N/A N/A N/A (cm) Periwound Skin Texture: Excoriation: No Callus: Yes Callus: Yes Induration: No Excoriation: No Excoriation: No Callus: No Induration: No Induration: No Crepitus: No Crepitus: No Crepitus: No Rash: No Rash: No Rash: No Scarring: No Scarring: No Scarring: No Periwound Skin Moisture: Maceration: No Maceration: No Maceration: No Dry/Scaly: No Dry/Scaly: No Dry/Scaly: No Periwound Skin Color: Atrophie Blanche: No Atrophie Blanche: No Atrophie Blanche: No Cyanosis: No Cyanosis: No Cyanosis: No Ecchymosis: No Ecchymosis: No Ecchymosis: No Erythema: No Erythema: No Erythema: No Hemosiderin Staining: No Hemosiderin Staining: No Hemosiderin Staining: No Mottled: No Mottled: No Mottled: No Pallor: No Pallor: No Pallor: No Rubor: No Rubor: No Rubor: No Temperature: No Abnormality No Abnormality No Abnormality Tenderness on Palpation: No No No Wound Preparation: Ulcer Cleansing: Ulcer Cleansing: Ulcer Cleansing: Rinsed/Irrigated with Saline Rinsed/Irrigated with Saline Rinsed/Irrigated with Saline Topical Anesthetic Applied: Topical Anesthetic Applied: Topical Anesthetic Applied: Other: lidocaine 4% Other: lidocaine 4% Other: lidocaine 4% Procedures Performed: N/A N/A N/A Wound Number: 7R N/A N/A Photos: N/A  N/A Wound Location: Left Toe Third N/A N/A Wounding Event: Gradually Appeared N/A N/A Primary Etiology: N/A N/A CHAYLEE, EHRSAM ([id number]) Diabetic Wound/Ulcer of the Lower Extremity Comorbid History: Anemia, Hypertension, Type II N/A N/A Diabetes, Osteoarthritis, Neuropathy, Seizure Disorder Date Acquired: 11/07/2017 N/A N/A Weeks of Treatment: 2 N/A N/A Wound Status: Open N/A N/A Wound Recurrence: Yes N/A N/A Pending Amputation on No N/A N/A Presentation: Measurements L x W x D 1x0.5x0.1 N/A N/A (cm) Area (cm) : 0.393 N/A N/A Volume (cm) : 0.039 N/A N/A % Reduction in Area: -1167.70% N/A N/A % Reduction in Volume: -550.00% N/A N/A Classification: Grade 1 N/A N/A Exudate Amount: None Present N/A N/A Exudate Type: N/A N/A N/A Exudate Color: N/A N/A N/A Wound Margin: Flat and Intact N/A N/A Granulation Amount: None Present (0%) N/A N/A Granulation Quality: N/A N/A N/A Necrotic Amount: None Present (0%) N/A N/A Exposed  Structures: Fat Layer (Subcutaneous N/A N/A Tissue) Exposed: Yes Fascia: No Tendon: No Muscle: No Joint: No Bone: No Epithelialization: None N/A N/A Debridement: Debridement - Excisional N/A N/A Pre-procedure 15:30 N/A N/A Verification/Time Out Taken: Pain Control: Other N/A N/A Tissue Debrided: Callus, Subcutaneous N/A N/A Level: Skin/Subcutaneous Tissue N/A N/A Debridement Area (sq cm): 0.5 N/A N/A Instrument: Curette N/A N/A Bleeding: Minimum N/A N/A Hemostasis Achieved: Pressure N/A N/A Debridement Treatment Procedure was tolerated well N/A N/A Response: Post Debridement 1x0.5x0.1 N/A N/A Measurements L x W x D (cm) Post Debridement Volume: 0.039 N/A N/A (cm) Periwound Skin Texture: Callus: Yes N/A N/A Excoriation: No Induration: No Crepitus: No Rash: No Scarring: No Periwound Skin Moisture: N/A N/A RECHY, BOST ([id number]) Maceration: No Dry/Scaly: No Periwound Skin Color: Atrophie Blanche: No N/A N/A Cyanosis:  No Ecchymosis: No Erythema: No Hemosiderin Staining: No Mottled: No Pallor: No Rubor: No Temperature: No Abnormality N/A N/A Tenderness on Palpation: No N/A N/A Wound Preparation: Ulcer Cleansing: N/A N/A Rinsed/Irrigated with Saline Topical Anesthetic Applied: Other: lidocaine 4% Procedures Performed: Debridement N/A N/A Treatment Notes Wound #4 (Left Toe Great) 1. Cleansed with: Clean wound with Normal Saline 2. Anesthetic Topical Lidocaine 4% cream to wound bed prior to debridement 4. Dressing Applied: Prisma Ag 5. Secondary Dressing Applied Dry Gauze Kerlix/Conform Wound #7R (Left Toe Third) 1. Cleansed with: Clean wound with Normal Saline 2. Anesthetic Topical Lidocaine 4% cream to wound bed prior to debridement 4. Dressing Applied: Prisma Ag 5. Secondary Dressing Applied Dry Gauze Kerlix/Conform Electronic Signature(s) Signed: 12/21/2017 6:14:59 PM By: Linton Ham MD Entered By: Linton Ham on 12/21/2017 17:20:32 [name] ([id number]) -------------------------------------------------------------------------------- [place] Details Patient Name: [name] Date of Service: 12/21/2017 3:00 PM Medical Record Number: [id number] Patient Account Number: [id number] Date of Birth/Sex: Feb 26, 1986 (32 y.o. F) Treating RN: Cornell Barman Primary Care Florrie Ramires: [name] Other Clinician: Referring Suleman Gunning: [name] Treating Mirabel Ahlgren/Extender: Tito Dine in Treatment: 2 Active Inactive ` Necrotic Tissue Nursing Diagnoses: Impaired tissue integrity related to necrotic/devitalized tissue Goals: Necrotic/devitalized tissue will be minimized in the wound bed Date Initiated: 12/01/2017 Target Resolution Date: 12/16/2017 Goal Status: Active Interventions: Assess patient pain level pre-, during and post procedure and prior to discharge Treatment Activities: Apply topical anesthetic as ordered :  12/01/2017 Notes: ` Orientation to the Wound Care Program Nursing Diagnoses: Knowledge deficit related to the wound healing center program Goals: Patient/caregiver will verbalize understanding of the [facility] Date Initiated: 12/01/2017 Target Resolution Date: 12/31/2017 Goal Status: Active Interventions: Provide education on orientation to the wound center Notes: ` Wound/Skin Impairment Nursing Diagnoses: Impaired tissue integrity Goals: Patient/caregiver will verbalize understanding of skin care regimen Date Initiated: 12/01/2017 Target Resolution Date: 12/31/2017 TOMECA, HELM ([id number]) Goal Status: Active Interventions: Assess ulceration(s) every visit Notes: Electronic Signature(s) Signed: 12/21/2017 5:34:51 PM By: Gretta Cool, BSN, RN, CWS, Kim RN, BSN Entered By: Gretta Cool, BSN, RN, CWS, Kim on 12/21/2017 15:26:08 [name] ([id number]) -------------------------------------------------------------------------------- Pain Assessment Details Patient Name: [name] Date of Service: 12/21/2017 3:00 PM Medical Record Number: [id number] Patient Account Number: [id number] Date of Birth/Sex: [birth year] (32 y.o. F) Treating RN: Montey Hora Primary Care Janaisa Birkland: [name] Other Clinician: Referring Kellis Mcadam: [name] Treating Torry Istre/Extender: Tito Dine in Treatment: 2 Active Problems Location of Pain Severity and Description of Pain Patient Has Paino Yes Site Locations Pain Location: Pain in Ulcers With Dressing Change: No Duration of the Pain. Constant / Intermittento Intermittent Pain Management and  Medication Current Pain Management: Notes hurts when walking or standing Electronic Signature(s) Signed: 12/21/2017 5:27:24 PM By: Montey Hora Entered By: Montey Hora on 12/21/2017 15:07:37 [name]  ([id number]) -------------------------------------------------------------------------------- Patient/Caregiver Education Details Patient Name: [name] Date of Service: 12/21/2017 3:00 PM Medical Record Number: [id number] Patient Account Number: [id number] Date of Birth/Gender: Oct 26, 1985 (32 y.o. F) Treating RN: Cornell Barman Primary Care Physician: [name] Other Clinician: Referring Physician: [name] Treating Physician/Extender: Tito Dine in Treatment: 2 Education Assessment Education Provided To: Patient Education Topics Provided Wound/Skin Impairment: Handouts: Caring for Your Ulcer Methods: Demonstration, Explain/Verbal Responses: State content correctly Electronic Signature(s) Signed: 12/21/2017 4:55:13 PM By: Roger Shelter Entered By: Roger Shelter on 12/21/2017 15:45:06 [name] ([id number]) -------------------------------------------------------------------------------- Wound Assessment Details Patient Name: [name] Date of Service: 12/21/2017 3:00 PM Medical Record Number: [id number] Patient Account Number: [id number] Date of Birth/Sex: [birth year] (32 y.o. F) Treating RN: Montey Hora Primary Care Kinlee Garrison: [name] Other Clinician: Referring Caleel Kiner: [name] Treating Sheyenne Konz/Extender: Tito Dine in Treatment: 2 Wound Status Wound Number: 4 Primary Diabetic Wound/Ulcer of the Lower Extremity Etiology: Wound Location: Left Toe Great Wound Open Wounding Event: Gradually Appeared Status: Date Acquired: 11/07/2017 Comorbid Anemia, Hypertension, Type II Diabetes, Weeks Of Treatment: 2 History: Osteoarthritis, Neuropathy, Seizure Disorder Clustered Wound: No Pending Amputation On Presentation Photos Photo Uploaded By: Secundino Ginger on 12/21/2017 15:18:20 Wound Measurements Length: (cm) 0.7 Width: (cm) 1.3 Depth: (cm) 0.2 Area: (cm) 0.715 Volume: (cm) 0.143 % Reduction in Area:  73.6% % Reduction in Volume: 82.4% Epithelialization: None Tunneling: No Undermining: No Wound Description Classification: Grade 1 Foul Odo Wound Margin: Flat and Intact Slough/F Exudate Amount: Medium Exudate Type: Serous Exudate Color: amber r After Cleansing: No ibrino Yes Wound Bed Granulation Amount: Large (67-100%) Exposed Structure Granulation Quality: Red Fascia Exposed: No Necrotic Amount: Small (1-33%) Fat Layer (Subcutaneous Tissue) Exposed: Yes Necrotic Quality: Adherent Slough Tendon Exposed: No Muscle Exposed: No Joint Exposed: No Bone Exposed: No Periwound Skin Texture CASY, TAVANO. ([id number]) Texture Color No Abnormalities Noted: No No Abnormalities Noted: No Callus: No Atrophie Blanche: No Crepitus: No Cyanosis: No Excoriation: No Ecchymosis: No Induration: No Erythema: No Rash: No Hemosiderin Staining: No Scarring: No Mottled: No Pallor: No Moisture Rubor: No No Abnormalities Noted: No Dry / Scaly: No Temperature / Pain Maceration: No Temperature: No Abnormality Wound Preparation Ulcer Cleansing: Rinsed/Irrigated with Saline Topical Anesthetic Applied: Other: lidocaine 4%, Treatment Notes Wound #4 (Left Toe Great) 1. Cleansed with: Clean wound with Normal Saline 2. Anesthetic Topical Lidocaine 4% cream to wound bed prior to debridement 4. Dressing Applied: Prisma Ag 5. Secondary Dressing Applied Dry Gauze Kerlix/Conform Electronic Signature(s) Signed: 12/21/2017 5:27:24 PM By: Montey Hora Entered By: Montey Hora on 12/21/2017 15:09:52 [name] ([id number]) -------------------------------------------------------------------------------- Wound Assessment Details Patient Name: [name] Date of Service: 12/21/2017 3:00 PM Medical Record Number: [id number] Patient Account Number: [id number] Date of Birth/Sex: 13-Jul-1985 (32 y.o. F) Treating RN: Montey Hora Primary Care Peachie Barkalow: [name] Other  Clinician: Referring Shanaya Schneck: [name] Treating Arlo Butt/Extender: Tito Dine in Treatment: 2 Wound Status Wound Number: 5 Primary Diabetic Wound/Ulcer of the Lower Extremity Etiology: Wound Location: Left Toe Second - Medial Wound Open Wounding Event: Gradually Appeared Status: Date Acquired: 11/07/2017 Comorbid Anemia, Hypertension, Type II Diabetes, Weeks Of Treatment: 2 History: Osteoarthritis, Neuropathy, Seizure Disorder Clustered Wound: No Pending Amputation On Presentation Photos Photo Uploaded By: Secundino Ginger on 12/21/2017 15:18:21 Wound Measurements Length: (cm) 0.1  Width: (cm) 0.1 Depth: (cm) 0.1 Area: (cm) 0.008 Volume: (cm) 0.001 % Reduction in Area: 97.5% % Reduction in Volume: 98.4% Epithelialization: None Tunneling: No Undermining: No Wound Description Classification: Grade 1 Foul Odo Wound Margin: Flat and Intact Slough/F Exudate Amount: None Present r After Cleansing: No ibrino No Wound Bed Granulation Amount: None Present (0%) Exposed Structure Necrotic Amount: None Present (0%) Fascia Exposed: No Fat Layer (Subcutaneous Tissue) Exposed: Yes Tendon Exposed: No Muscle Exposed: No Joint Exposed: No Bone Exposed: No Periwound Skin Texture Texture Color NECHUMA, BOVEN. ([id number]) No Abnormalities Noted: No No Abnormalities Noted: No Callus: Yes Atrophie Blanche: No Crepitus: No Cyanosis: No Excoriation: No Ecchymosis: No Induration: No Erythema: No Rash: No Hemosiderin Staining: No Scarring: No Mottled: No Pallor: No Moisture Rubor: No No Abnormalities Noted: No Dry / Scaly: No Temperature / Pain Maceration: No Temperature: No Abnormality Wound Preparation Ulcer Cleansing: Rinsed/Irrigated with Saline Topical Anesthetic Applied: Other: lidocaine 4%, Electronic Signature(s) Signed: 12/21/2017 5:27:24 PM By: Montey Hora Entered By: Montey Hora on 12/21/2017 15:10:46 [name]  ([id number]) -------------------------------------------------------------------------------- Wound Assessment Details Patient Name: [name] Date of Service: 12/21/2017 3:00 PM Medical Record Number: [id number] Patient Account Number: [id number] Date of Birth/Sex: 1985/09/17 (32 y.o. F) Treating RN: Montey Hora Primary Care Whittley Carandang: [name] Other Clinician: Referring Kaito Schulenburg: [name] Treating Isac Lincks/Extender: Tito Dine in Treatment: 2 Wound Status Wound Number: 6 Primary Diabetic Wound/Ulcer of the Lower Extremity Etiology: Wound Location: Left Toe Second - Distal Wound Open Wounding Event: Gradually Appeared Status: Date Acquired: 11/07/2017 Comorbid Anemia, Hypertension, Type II Diabetes, Weeks Of Treatment: 2 History: Osteoarthritis, Neuropathy, Seizure Disorder Clustered Wound: No Pending Amputation On Presentation Photos Photo Uploaded By: Secundino Ginger on 12/21/2017 15:18:47 Wound Measurements Length: (cm) 0.1 Width: (cm) 0.1 Depth: (cm) 0.1 Area: (cm) 0.008 Volume: (cm) 0.001 % Reduction in Area: 0% % Reduction in Volume: 0% Epithelialization: None Tunneling: No Undermining: No Wound Description Classification: Grade 1 Foul Odo Wound Margin: Flat and Intact Slough/F Exudate Amount: None Present r After Cleansing: No ibrino No Wound Bed Granulation Amount: None Present (0%) Exposed Structure Necrotic Amount: None Present (0%) Fascia Exposed: No Fat Layer (Subcutaneous Tissue) Exposed: No Tendon Exposed: No Muscle Exposed: No Joint Exposed: No Bone Exposed: No Periwound Skin Texture Texture Color JACARA, BENITO. ([id number]) No Abnormalities Noted: No No Abnormalities Noted: No Callus: Yes Atrophie Blanche: No Crepitus: No Cyanosis: No Excoriation: No Ecchymosis: No Induration: No Erythema: No Rash: No Hemosiderin Staining: No Scarring: No Mottled: No Pallor: No Moisture Rubor: No No Abnormalities  Noted: No Dry / Scaly: No Temperature / Pain Maceration: No Temperature: No Abnormality Wound Preparation Ulcer Cleansing: Rinsed/Irrigated with Saline Topical Anesthetic Applied: Other: lidocaine 4%, Electronic Signature(s) Signed: 12/21/2017 5:27:24 PM By: Montey Hora Entered By: Montey Hora on 12/21/2017 15:11:46 [name] ([id number]) -------------------------------------------------------------------------------- Wound Assessment Details Patient Name: [name] Date of Service: 12/21/2017 3:00 PM Medical Record Number: [id number] Patient Account Number: [id number] Date of Birth/Sex: 24-Aug-1985 (32 y.o. F) Treating RN: Cornell Barman Primary Care Vergia Chea: [name] Other Clinician: Referring Cain Fitzhenry: [name] Treating Aerianna Losey/Extender: Tito Dine in Treatment: 2 Wound Status Wound Number: 7R Primary Diabetic Wound/Ulcer of the Lower Extremity Etiology: Wound Location: Left Toe Third Wound Open Wounding Event: Gradually Appeared Status: Date Acquired: 11/07/2017 Comorbid Anemia, Hypertension, Type II Diabetes, Weeks Of Treatment: 2 History: Osteoarthritis, Neuropathy, Seizure Disorder Clustered Wound: No Photos Wound Measurements Length: (cm) 1 Width: (cm) 0.5 Depth: (cm) 0.1  Area: (cm) 0.393 Volume: (cm) 0.039 % Reduction in Area: -1167.7% % Reduction in Volume: -550% Epithelialization: None Wound Description Classification: Grade 1 Wound Margin: Flat and Intact Exudate Amount: None Present Foul Odor After Cleansing: No Slough/Fibrino Yes Wound Bed Granulation Amount: None Present (0%) Exposed Structure Necrotic Amount: None Present (0%) Fascia Exposed: No Fat Layer (Subcutaneous Tissue) Exposed: Yes Tendon Exposed: No Muscle Exposed: No Joint Exposed: No Bone Exposed: No Periwound Skin Texture Texture Color No Abnormalities Noted: No No Abnormalities Noted: No Callus: Yes Atrophie Blanche: No NIKKIE, LIMING  ([id number]) Crepitus: No Cyanosis: No Excoriation: No Ecchymosis: No Induration: No Erythema: No Rash: No Hemosiderin Staining: No Scarring: No Mottled: No Pallor: No Moisture Rubor: No No Abnormalities Noted: No Dry / Scaly: No Temperature / Pain Maceration: No Temperature: No Abnormality Wound Preparation Ulcer Cleansing: Rinsed/Irrigated with Saline Topical Anesthetic Applied: Other: lidocaine 4%, Treatment Notes Wound #7R (Left Toe Third) 1. Cleansed with: Clean wound with Normal Saline 2. Anesthetic Topical Lidocaine 4% cream to wound bed prior to debridement 4. Dressing Applied: Prisma Ag 5. Secondary Dressing Applied Dry Gauze Kerlix/Conform Electronic Signature(s) Signed: 12/21/2017 3:50:00 PM By: Roger Shelter Signed: 12/21/2017 5:34:51 PM By: Gretta Cool, BSN, RN, CWS, Kim RN, BSN Entered By: Roger Shelter on 12/21/2017 15:49:59 [name] ([id number]) -------------------------------------------------------------------------------- [place] Details Patient Name: [name] Date of Service: 12/21/2017 3:00 PM Medical Record Number: [id number] Patient Account Number: [id number] Date of Birth/Sex: May 10, 1985 (32 y.o. F) Treating RN: Montey Hora Primary Care Gwyndolyn Guilford: [name] Other Clinician: Referring Zayan Delvecchio: [name] Treating Angus Amini/Extender: Tito Dine in Treatment: 2 Vital Signs Time Taken: 15:07 Temperature (F): 98.2 Height (in): 66 Pulse (bpm): 104 Weight (lbs): 174 Respiratory Rate (breaths/min): 16 Body Mass Index (BMI): 28.1 Blood Pressure (mmHg): 146/98 Reference Range: 80 - 120 mg / dl Electronic Signature(s) Signed: 12/21/2017 5:27:24 PM By: Montey Hora Entered By: Montey Hora on 12/21/2017 15:07:54

## 2017-12-24 NOTE — Progress Notes (Signed)
AMAND, LEMOINE ([id number]) Visit Report for 12/21/2017 Debridement Details Patient Name: [name], [name] Date of Service: 12/21/2017 3:00 PM Medical Record Number: [id number] Patient Account Number: [id number] Date of Birth/Sex: [birth year] (32 y.o. F) Treating RN: Cornell Barman Primary Care Provider: [name] Other Clinician: Referring Provider: [name] Treating Provider/Extender: Tito Dine in Treatment: 2 Debridement Performed for Wound #7R Left Toe Third Assessment: Performed By: Physician Ricard Dillon, MD Debridement Type: Debridement Severity of Tissue Pre Fat layer exposed Debridement: Level of Consciousness (Pre- Awake and Alert procedure): Pre-procedure Verification/Time Yes - 15:30 Out Taken: Start Time: 15:31 Pain Control: Other : lidocaine 4% Total Area Debrided (L x W): 1 (cm) x 0.5 (cm) = 0.5 (cm) Tissue and other material Viable, Non-Viable, Callus, Subcutaneous, Skin: Dermis debrided: Level: Skin/Subcutaneous Tissue Debridement Description: Excisional Instrument: Curette Bleeding: Minimum Hemostasis Achieved: Pressure End Time: 15:35 Response to Treatment: Procedure was tolerated well Level of Consciousness Awake and Alert (Post-procedure): Post Debridement Measurements of Total Wound Length: (cm) 1 Width: (cm) 0.5 Depth: (cm) 0.1 Volume: (cm) 0.039 Character of Wound/Ulcer Post Debridement: Stable Severity of Tissue Post Debridement: Fat layer exposed Post Procedure Diagnosis Same as Pre-procedure Electronic Signature(s) Signed: 12/21/2017 5:34:51 PM By: Gretta Cool, BSN, RN, CWS, Kim RN, BSN Signed: 12/21/2017 6:14:59 PM By: Linton Ham MD Previous Signature: 12/21/2017 3:42:11 PM Version By: Gretta Cool, BSN, RN, CWS, Kim RN, BSN Entered By: Linton Ham on 12/21/2017 17:21:31 [name], [name] ([id number]) [name] ([id number]) -------------------------------------------------------------------------------- HPI  Details Patient Name: [name] Date of Service: 12/21/2017 3:00 PM Medical Record Number: [id number] Patient Account Number: [id number] Date of Birth/Sex: 18-Sep-1985 (32 y.o. F) Treating RN: Cornell Barman Primary Care Provider: [name] Other Clinician: Referring Provider: [name] Treating Provider/Extender: Tito Dine in Treatment: 2 History of Present Illness HPI Description: 02/14/17 on evaluation today patient appears to be doing fairly well all things considered. She tells me that around the middle of November she was sleeping close to a space heater when she woke up and sustained a burn to her right first toe. She has a history of diabetes which is uncontrolled her last hemoglobin A1c with 11.5 on December 12, 2016. She is status post having had a kidney transplant and this was necessitated by apparently a high dose of antibiotics given to her as a child. Overall she has been tolerating the wound fairly well all things considered she has been putting antibiotic ointment on the area but otherwise no other treatment. She did go to the ER twice the station left without being seen due to the long wait. She continues to have discomfort rated to be 3-4/10 which is worse with toucher cleansing of the wound. 02/24/2017 -- she has uncontrolled diabetes mellitus with hyperglycemia and her last hemoglobin A1c was 14%. I have asked her to be more careful and see her PCP regarding this. She is also not wearing bilateral compression stockings as recommended before. 03/18/17 on evaluation today patient appears to be doing very well and in fact her wound appears to be completely healed. She has been tolerating the dressing changes without complication. Fortunately she is having no pain. *** 05/26/17 patient seen today for reevaluation concerning her right great toe ulcer. She has previously been evaluated by myself in January through the beginning of the year before  subsequently being discharged. She was completely healed at that point. Unfortunately patient tells me that she's unsure of exactly what happened but this wound has reopened. Upon  hearing her story it sounds as if she likely injured this utilizing a pumis stone that she uses to work on her calluses. At one point she even asked me if there was a different way that she could potentially work on all of the callous and dry skin on her foot other than utilizing the stone. With that being said her story at this point was that she felt that she may have burnt her foot specifically the great toe on a space heater that she keeps on the bed stand beside her bed. With that being said I do not think that's very likely the toes around and all the surrounding region does not appear to show any signs of thermal injury nor does this ulcer appear to really be thermal in nature. It very much looks more like a diabetic foot ulcer. Patient's most recent hemoglobin A1c was 11.2 that was in January 2019. She has not had this check since she tells me that her blood sugars run in the "300s". Otherwise not much has really changed since I saw her previously. 06/02/17- She is here in follow-up evaluation for right great toe ulcer. Plain film x-ray revealed evidence worrisome for osteomyelitis, will order MRI; we discussed these findings. She presents to the clinic with feelings of hypoglycemia, she was provided with an orange juice in her glucose was 83; despite this being a normal glucose level am not surprised she is having hypoglycemic symptoms. She tolerated debridement. We discussed the need for tight glycemic control (her a1c has been 11 in Nov and Jan), offloading/reduced trauma and compliance with medical treatment plan. A consult for ID was placed 06/09/17-She is here in follow-up evaluation for right great toe ulcer. Her MRI is scheduled for tomorrow. The wound is significantly improved with granulation tissue  encompassing most of the wound, small amount of nonviable tissue close to the nail with uneven coloration of the toenail itself, currently no lifting. She has an appointment with podiatry and endocrinology on 4/9; an appointment with Dr. Ola Spurr on 4/11. I prescribed Levaquin last week which she has not started. Due to the improvement of the wound with granulation tissue, I cultured the wound after debridement and we will hold off on initiating Levaquin. I will reach out to her nephrologist, Dr. Lorrene Reid regarding antibiotic selection. If the MRI is negative for osteomyelitis we will cancel her appointment with Dr. Ola Spurr. She states she has been more diligent in maintaining better glycemic control with more levels being below 200 then over; she has been encouraged to maintain this for continued wound healing. 06/16/17-She is here in follow up evaluation for right great toe ulcer. 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 She does have an appointment with Dr. Ola Spurr on 4/11. Culture that was obtained last week grew oxacillin sensitive staph aureus, she was initiated on the Levaquin that was originally ordered on 3/21. She admits to taking her loading dose on Monday 4/1 and starting her 250 mg daily dose on 4/2. We will extend the Levaquin 250 mg daily through her appointment with Dr. Ola Spurr. There continues to be improvement in both measurements and appearance, we will transition from Santyl to [facility]. She states her glucose levels are consistently less than 200 with fewer times greater than 200. She will follow-up next week [name], [name] ([id number]) Readmission: 12/01/17 on evaluation today patient presents for reevaluation due to ulcers on the left foot. She tells me at this point though honestly she is a poor historian but she is unsure  when these all showed up. She's been tolerating the dressing changes without complication using just a in the ointment and a Band-Aid as needed. With  that being said she did become somewhat concerned about these and therefore made the appointment to come in for further evaluation with [place]. Fortunately there is no evidence of infection at this time. 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 Currently all the wounds on the left. No fevers, chills, nausea, or vomiting noted at this time. Patientos last A1c was 8.15 October 2017. 12/08/17 on evaluation today patient actually appears to be doing much better in regard to her wounds in general. In fact the Santyl seems to have done very well as far as losing up the necrotic material at this point in time and overall I do feel she's made great progress. One of the areas appears to have healed the other three are all doing better. 12/21/17 patient is a 32 year old woman who is listed in our record is a type II diabetic although in [place] as a type I diabetic. In any case she is on insulin. She has wounds on her left foot including the dorsal left first toe medial left second toe and a thickened eschar on the left third toe. We've been using silver collagen. It doesn't appear that she is actually offloading these. 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 F) Treating RN: Cornell Barman Primary Care Provider: [name] Other Clinician: Referring Provider: [name] Treating Provider/Extender: Tito Dine in Treatment: 2 Constitutional Patient is hypertensive.. Pulse regular and within target range for  patient.Marland Kitchen Respirations regular, non-labored and within target range.. Temperature is normal and within the target range for the patient.Marland Kitchen appears in no distress. Notes Wound exam oBase of the wound on the great toe dorsally look quite satisfactory. No debridement was required oSome surface eschar removed from the medial part of the second toe I don't think there is anything opening here oEschar on the tip of the third toe was removed and there is an opening here. I don't believe this is previously been defined Electronic Signature(s) Signed: 12/21/2017 6:14:59 PM By: Linton Ham MD Entered By: Linton Ham on 12/21/2017 17:25:20 [name] ([id number]) -------------------------------------------------------------------------------- Physician Orders Details Patient Name: [name] Date of Service: 12/21/2017 3:00 PM Medical Record Number: [id number] Patient Account Number: [id number] Date of Birth/Sex: 08-03-1985 (32 y.o. F) Treating RN: Cornell Barman Primary Care Provider: [name] Other Clinician: Referring Provider: [name] Treating Provider/Extender: Tito Dine in Treatment: 2 Verbal / Phone Orders: No Diagnosis Coding Wound Cleansing Wound #4 Left Toe Great o Clean wound with Normal Saline. Wound #5 Left,Medial Toe Second o Clean wound with Normal Saline. Wound #6 Left,Distal Toe Second o Clean wound with Normal Saline. Wound #7R Left Toe Third o Clean wound with Normal Saline. Anesthetic (add to Medication List) Wound #4 Left Toe Great o Topical Lidocaine 4% cream applied to wound bed prior to debridement (In Clinic Only). Wound #5 Left,Medial Toe Second o Topical Lidocaine 4% cream applied to wound bed prior to debridement (In Clinic Only). Wound #6 Left,Distal Toe Second o Topical Lidocaine  4% cream applied to wound bed prior to debridement (In Clinic Only). Wound #7R Left Toe Third o Topical Lidocaine 4% cream  applied to wound bed prior to debridement (In Clinic Only). Primary Wound Dressing Wound #4 Left Toe Great o Silver Collagen Wound #5 Left,Medial Toe Second o Silver Collagen Wound #6 Left,Distal Toe Second o Silver Collagen Wound #7R Left Toe Third o Silver Collagen Secondary Dressing Wound #4 Left Toe Great o Gauze and Kerlix/Conform Wound #5 Left,Medial Toe Second [name], DEPAULA. ([id number]) o Gauze and Kerlix/Conform Wound #6 Left,Distal Toe Second o Gauze and Kerlix/Conform Wound #7R Left Toe Third o Gauze and Kerlix/Conform Dressing Change Frequency Wound #4 Left Toe Great o Change dressing every other day. Wound #5 Left,Medial Toe Second o Change dressing every other day. Wound #6 Left,Distal Toe Second o Change dressing every other day. Wound #7R Left Toe Third o Change dressing every other day. Follow-up Appointments Wound #4 Left Toe Great o Return Appointment in 1 week. Wound #5 Left,Medial Toe Second o Return Appointment in 1 week. Wound #6 Left,Distal Toe Second o Return Appointment in 1 week. Wound #7R Left Toe Third o Return Appointment in 1 week. Off-Loading Wound #4 Left Toe Great o Other: - do not let toes rub in shoes. Wound #5 Left,Medial Toe Second o Other: - do not let toes rub in shoes. Wound #6 Left,Distal Toe Second o Other: - do not let toes rub in shoes. Wound #7R Left Toe Third o Other: - do not let toes rub in shoes. Electronic Signature(s) Signed: 12/21/2017 3:43:41 PM By: Gretta Cool, BSN, RN, CWS, Kim RN, BSN Signed: 12/21/2017 6:14:59 PM By: Linton Ham MD Entered By: Gretta Cool, BSN, RN, CWS, Kim on 12/21/2017 15:43:40 [name], [name] ([id number]) -------------------------------------------------------------------------------- Problem List Details Patient Name: [name] Date of Service: 12/21/2017 3:00 PM Medical Record Number: [id number] Patient Account Number: [id number] Date of Birth/Sex:  September 22, 1985 (32 y.o. F) Treating RN: Cornell Barman Primary Care Provider: [name] Other Clinician: Referring Provider: [name] Treating Provider/Extender: Tito Dine in Treatment: 2 Active Problems ICD-10 Evaluated Encounter Code Description Active Date Today Diagnosis E11.621 Type 2 diabetes mellitus with foot ulcer 12/01/2017 No Yes L97.522 Non-pressure chronic ulcer of other part of left foot with fat 12/01/2017 No Yes layer exposed Z94.0 Kidney transplant status 12/01/2017 No Yes I10 Essential (primary) hypertension 12/01/2017 No Yes Inactive Problems Resolved Problems Electronic Signature(s) Signed: 12/21/2017 6:14:59 PM By: Linton Ham MD Entered By: Linton Ham on 12/21/2017 17:20:02 [name] ([id number]) -------------------------------------------------------------------------------- Progress Note Details Patient Name: [name] Date of Service: 12/21/2017 3:00 PM Medical Record Number: [id number] Patient Account Number: [id number] Date of Birth/Sex: 01-24-1986 (32 y.o. F) Treating RN: Cornell Barman Primary Care Provider: [name] Other Clinician: Referring Provider: [name] Treating Provider/Extender: Tito Dine in Treatment: 2 Subjective History of Present Illness (HPI) 02/14/17 on evaluation today patient appears to be doing fairly well all things considered. She tells me that around the middle of November she was sleeping close to a space heater when she woke up and sustained a burn to her right first toe. She has a history of diabetes which is uncontrolled her last hemoglobin A1c with 11.5 on December 12, 2016. She is status post having had a kidney transplant and this was necessitated by apparently a high dose of antibiotics given to her as a child. Overall she has been tolerating the wound fairly well all things considered she has been putting antibiotic ointment  on the area but otherwise no other  treatment. She did go to the ER twice the station left without being seen due to the long wait. She continues to have discomfort rated to be 3-4/10 which is worse with toucher cleansing of the wound. 02/24/2017 -- she has uncontrolled diabetes mellitus with hyperglycemia and her last hemoglobin A1c was 14%. I have asked her to be more careful and see her PCP regarding this. She is also not wearing bilateral compression stockings as recommended before. 03/18/17 on evaluation today patient appears to be doing very well and in fact her wound appears to be completely healed. She has been tolerating the dressing changes without complication. Fortunately she is having no pain. *** 05/26/17 patient seen today for reevaluation concerning her right great toe ulcer. She has previously been evaluated by myself in January through the beginning of the year before subsequently being discharged. She was completely healed at that point. Unfortunately patient tells me that she's unsure of exactly what happened but this wound has reopened. Upon hearing her story it sounds as if she likely injured this utilizing a pumis stone that she uses to work on her calluses. At one point she even asked me if there was a different way that she could potentially work on all of the callous and dry skin on her foot other than utilizing the stone. With that being said her story at this point was that she felt that she may have burnt her foot specifically the great toe on a space heater that she keeps on the bed stand beside her bed. With that being said I do not think that's very likely the toes around and all the surrounding region does not appear to show any signs of thermal injury nor does this ulcer appear to really be thermal in nature. It very much looks more like a diabetic foot ulcer. Patient's most recent hemoglobin A1c was 11.2 that was in January 2019. She has not had this check since she tells me that her blood sugars run in  the "300s". Otherwise not much has really changed since I saw her previously. 06/02/17- She is here in follow-up evaluation for right great toe ulcer. Plain film x-ray revealed evidence worrisome for osteomyelitis, will order MRI; we discussed these findings. She presents to the clinic with feelings of hypoglycemia, she was provided with an orange juice in her glucose was 83; despite this being a normal glucose level am not surprised she is having hypoglycemic symptoms. She tolerated debridement. We discussed the need for tight glycemic control (her a1c has been 11 in Nov and Jan), offloading/reduced trauma and compliance with medical treatment plan. A consult for ID was placed 06/09/17-She is here in follow-up evaluation for right great toe ulcer. Her MRI is scheduled for tomorrow. The wound is significantly improved with granulation tissue encompassing most of the wound, small amount of nonviable tissue close to the nail with uneven coloration of the toenail itself, currently no lifting. She has an appointment with podiatry and endocrinology on 4/9; an appointment with Dr. Ola Spurr on 4/11. I prescribed Levaquin last week which she has not started. Due to the improvement of the wound with granulation tissue, I cultured the wound after debridement and we will hold off on initiating Levaquin. I will reach out to her nephrologist, Dr. Lorrene Reid regarding antibiotic selection. If the MRI is negative for osteomyelitis we will cancel her appointment with Dr. Ola Spurr. She states she has been more diligent in maintaining better glycemic  control with more levels being below 200 then over; she has been encouraged to maintain this for continued wound healing. 06/16/17-She is here in follow up evaluation for right great toe ulcer. 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 She does have an appointment with Dr. Ola Spurr on 4/11. Culture that was obtained last week grew oxacillin sensitive staph aureus, she was initiated  on the Levaquin that was originally ordered on 3/21. She admits to taking her loading dose on Monday 4/1 and starting her 250 mg daily dose on 4/2. We will extend the Levaquin 250 mg daily through her appointment with Dr. Ola Spurr. There continues to be improvement in both measurements and appearance, we will transition from Santyl to [facility]. She states her glucose levels are consistently less than 200 with fewer times greater than 200. She will follow-up next week [name], [name] ([id number]) Readmission: 12/01/17 on evaluation today patient presents for reevaluation due to ulcers on the left foot. She tells me at this point though honestly she is a poor historian but she is unsure when these all showed up. She's been tolerating the dressing changes without complication using just a in the ointment and a Band-Aid as needed. With that being said she did become somewhat concerned about these and therefore made the appointment to come in for further evaluation with [place]. Fortunately there is no evidence of infection at this time. 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 Currently all the wounds on the left. No fevers, chills, nausea, or vomiting noted at this time. Patient s last A1c was 8.15 October 2017. 12/08/17 on evaluation today patient actually appears to be doing much better in regard to her wounds in general. In fact the Santyl seems to have done very well as far as losing up the necrotic material at this point in time and overall I do feel she's made great progress. One of the areas appears to have healed the other three are all doing better. 12/21/17 patient is a 32 year old woman who is listed in our record is a type II diabetic although in [place] as a type I diabetic. In any case she is on insulin. She has wounds on her left foot including the dorsal left first toe medial left second toe and a thickened eschar on the left third toe. We've been  using silver collagen. It doesn't appear that she is actually offloading these. 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 Pulse regular and within target range for patient.Marland Kitchen Respirations regular, non-labored and within target range.. Temperature is normal and within the target range for the patient.Marland Kitchen appears in no distress. Vitals Time Taken: 3:07 PM, Height: 66 in, Weight: 174 lbs, BMI: 28.1, Temperature: 98.2 F, Pulse: 104 bpm, Respiratory Rate: 16 breaths/min, Blood Pressure: 146/98 mmHg. General Notes: Wound exam Base of the wound on the great toe dorsally look quite satisfactory. No debridement was required Some surface eschar removed from the medial part of the second toe I don't think there is anything opening here Eschar on the tip of the third toe was removed and there is an opening here. I don't believe this is previously been defined Integumentary (Hair, Skin) Wound #4 status is Open. Original cause of wound was Gradually Appeared. The wound is located on the Left Toe Great. The wound measures 0.7cm length x 1.3cm width x 0.2cm depth; 0.715cm^2 area and 0.143cm^3 volume. There is Fat Layer (Subcutaneous Tissue) Exposed exposed.  There is no tunneling or undermining noted. There is a medium amount of serous drainage noted. The wound margin is flat and intact. There is large (67-100%) red granulation within the wound bed. There is a small (1-33%) amount of necrotic tissue within the wound bed including Adherent Slough. The periwound skin appearance did not exhibit: Callus, Crepitus, Excoriation, Induration, Rash, Scarring, Dry/Scaly, Maceration, Atrophie Blanche, Cyanosis, Ecchymosis, Hemosiderin Staining, Mottled, Pallor, Rubor, Erythema. Periwound temperature was noted as No Abnormality. Wound #5 status is Open. Original cause of wound was Gradually Appeared. The wound is located on the Left,Medial Toe Second. The wound  measures 0.1cm length x 0.1cm width x 0.1cm depth; 0.008cm^2 area and 0.001cm^3 volume. There is Fat Layer (Subcutaneous Tissue) Exposed exposed. There is no tunneling or undermining noted. There is a none present amount of drainage noted. The wound margin is flat and intact. There is no granulation within the wound bed. There is no necrotic tissue within the wound bed. The periwound skin appearance exhibited: Callus. The periwound skin appearance did not exhibit: Crepitus, Excoriation, Induration, Rash, Scarring, Dry/Scaly, Maceration, Atrophie Blanche, Cyanosis, Ecchymosis, Hemosiderin Staining, Mottled, Pallor, Rubor, Erythema. Periwound temperature was noted as No Abnormality. Wound #6 status is Open. Original cause of wound was Gradually Appeared. The wound is located on the [address] Harvey, Valerie S. ([id number]) Second. The wound measures 0.1cm length x 0.1cm width x 0.1cm depth; 0.008cm^2 area and 0.001cm^3 volume. There is no tunneling or undermining noted. There is a none present amount of drainage noted. The wound margin is flat and intact. There is no granulation within the wound bed. There is no necrotic tissue within the wound bed. The periwound skin appearance exhibited: Callus. The periwound skin appearance did not exhibit: Crepitus, Excoriation, Induration, Rash, Scarring, Dry/Scaly, Maceration, Atrophie Blanche, Cyanosis, Ecchymosis, Hemosiderin Staining, Mottled, Pallor, Rubor, Erythema. Periwound temperature was noted as No Abnormality. Wound #7R status is Open. Original cause of wound was Gradually Appeared. The wound is located on the Left Toe Third. The wound measures 1cm length x 0.5cm width x 0.1cm depth; 0.393cm^2 area and 0.039cm^3 volume. There is Fat Layer (Subcutaneous Tissue) Exposed exposed. There is a none present amount of drainage noted. The wound margin is flat and intact. There is no granulation within the wound bed. There is no necrotic tissue within the  wound bed. The periwound skin appearance exhibited: Callus. The periwound skin appearance did not exhibit: Crepitus, Excoriation, Induration, Rash, Scarring, Dry/Scaly, Maceration, Atrophie Blanche, Cyanosis, Ecchymosis, Hemosiderin Staining, Mottled, Pallor, Rubor, Erythema. Periwound temperature was noted as No Abnormality. Assessment Active Problems ICD-10 Type 2 diabetes mellitus with foot ulcer Non-pressure chronic ulcer of other part of left foot with fat layer exposed Kidney transplant status Essential (primary) hypertension Procedures Wound #7R Pre-procedure diagnosis of Wound #7R is a Diabetic Wound/Ulcer of the Lower Extremity located on the Left Toe Third .Severity of Tissue Pre Debridement is: Fat layer exposed. There was a Excisional Skin/Subcutaneous Tissue Debridement with a total area of 0.5 sq cm performed by Ricard Dillon, MD. With the following instrument(s): Curette to remove Viable and Non-Viable tissue/material. Material removed includes Callus, Subcutaneous Tissue, and Skin: Dermis after achieving pain control using Other (lidocaine 4%). No specimens were taken. A time out was conducted at 15:30, prior to the start of the procedure. A Minimum amount of bleeding was controlled with Pressure. The procedure was tolerated well. Post Debridement Measurements: 1cm length x 0.5cm width x 0.1cm depth; 0.039cm^3 volume. Character of Wound/Ulcer Post Debridement is stable.  Severity of Tissue Post Debridement is: Fat layer exposed. Post procedure Diagnosis Wound #7R: Same as Pre-Procedure Plan Wound Cleansing: Wound #4 Left Toe Great: Clean wound with Normal Saline. Wound #5 Left,Medial Toe Second: Clean wound with Normal Saline. [name], [name] ([id number]) Wound #6 Left,Distal Toe Second: Clean wound with Normal Saline. Wound #7R Left Toe Third: Clean wound with Normal Saline. Anesthetic (add to Medication List): Wound #4 Left Toe Great: Topical Lidocaine 4% cream  applied to wound bed prior to debridement (In Clinic Only). Wound #5 Left,Medial Toe Second: Topical Lidocaine 4% cream applied to wound bed prior to debridement (In Clinic Only). Wound #6 Left,Distal Toe Second: Topical Lidocaine 4% cream applied to wound bed prior to debridement (In Clinic Only). Wound #7R Left Toe Third: Topical Lidocaine 4% cream applied to wound bed prior to debridement (In Clinic Only). Primary Wound Dressing: Wound #4 Left Toe Great: Silver Collagen Wound #5 Left,Medial Toe Second: Silver Collagen Wound #6 Left,Distal Toe Second: Silver Collagen Wound #7R Left Toe Third: Silver Collagen Secondary Dressing: Wound #4 Left Toe Great: Gauze and Kerlix/Conform Wound #5 Left,Medial Toe Second: Gauze and Kerlix/Conform Wound #6 Left,Distal Toe Second: Gauze and Kerlix/Conform Wound #7R Left Toe Third: Gauze and Kerlix/Conform Dressing Change Frequency: Wound #4 Left Toe Great: Change dressing every other day. Wound #5 Left,Medial Toe Second: Change dressing every other day. Wound #6 Left,Distal Toe Second: Change dressing every other day. Wound #7R Left Toe Third: Change dressing every other day. Follow-up Appointments: Wound #4 Left Toe Great: Return Appointment in 1 week. Wound #5 Left,Medial Toe Second: Return Appointment in 1 week. Wound #6 Left,Distal Toe Second: Return Appointment in 1 week. Wound #7R Left Toe Third: Return Appointment in 1 week. Off-Loading: Wound #4 Left Toe Great: Other: - do not let toes rub in shoes. Wound #5 Left,Medial Toe Second: Other: - do not let toes rub in shoes. Wound #6 Left,Distal Toe Second: Other: - do not let toes rub in shoes. Wound #7R Left Toe Third: Other: - do not let toes rub in shoes. [name], [name] ([id number]) #1 Silver collagen,Gauze Kerlix and for conform to all wound areas #2There is probably some friction and the tight fitting shoes she used although I didn't get into the offloading  possibilities here. Electronic Signature(s) Signed: 12/21/2017 6:14:59 PM By: Linton Ham MD Entered By: Linton Ham on 12/21/2017 17:26:36 [name] ([id number]) -------------------------------------------------------------------------------- SuperBill Details Patient Name: [name] Date of Service: 12/21/2017 Medical Record Number: [id number] Patient Account Number: [id number] Date of Birth/Sex: [birth year] (32 y.o. F) Treating RN: Cornell Barman Primary Care Provider: [name] Other Clinician: Referring Provider: [name] Treating Provider/Extender: Tito Dine in Treatment: 2 Diagnosis Coding ICD-10 Codes Code Description E11.621 Type 2 diabetes mellitus with foot ulcer L97.522 Non-pressure chronic ulcer of other part of left foot with fat layer exposed Z94.0 Kidney transplant status I10 Essential (primary) hypertension Facility Procedures CPT4 Code: [id number] Description: 54627 - DEB SUBQ TISSUE 20 SQ CM/< ICD-10 Diagnosis Description L97.522 Non-pressure chronic ulcer of other part of left foot with fat E11.621 Type 2 diabetes mellitus with foot ulcer Modifier: layer exposed Quantity: 1 Physician Procedures CPT4 Code: [id number] Description: 11042 - WC PHYS SUBQ TISS 20 SQ CM ICD-10 Diagnosis Description L97.522 Non-pressure chronic ulcer of other part of left foot with fat E11.621 Type 2 diabetes mellitus with foot ulcer Modifier: layer exposed Quantity: 1 Electronic Signature(s) Signed: 12/21/2017 6:14:59 PM By: Linton Ham MD Entered By: Linton Ham on 12/21/2017 17:27:16

## 2017-12-29 ENCOUNTER — Ambulatory Visit (HOSPITAL_COMMUNITY)
Admission: RE | Admit: 2017-12-29 | Discharge: 2017-12-29 | Disposition: A | Discharge status: Home / Self Care | Payer: Medicare Other | Source: Ambulatory Visit | Attending: Nephrology | Admitting: Nephrology

## 2017-12-29 VITALS — BP 159/109 | HR 108

## 2017-12-29 DIAGNOSIS — N182 Chronic kidney disease, stage 2 (mild): Secondary | ICD-10-CM

## 2017-12-29 DIAGNOSIS — N183 Chronic kidney disease, stage 3 (moderate): Secondary | ICD-10-CM | POA: Insufficient documentation

## 2017-12-29 DIAGNOSIS — D631 Anemia in chronic kidney disease: Secondary | ICD-10-CM | POA: Insufficient documentation

## 2017-12-29 LAB — POCT HEMOGLOBIN-HEMACUE: HEMOGLOBIN: 8.9 g/dL — AB (ref 12.0–15.0)

## 2017-12-29 MED ORDER — EPOETIN ALFA 10000 UNIT/ML IJ SOLN
INTRAMUSCULAR | Status: AC
  Administered 2017-12-29: 10000 [IU]
  Filled 2017-12-29: qty 1

## 2017-12-29 MED ORDER — EPOETIN ALFA 20000 UNIT/ML IJ SOLN
INTRAMUSCULAR | Status: AC
  Administered 2017-12-29: 20000 [IU]
  Filled 2017-12-29: qty 1

## 2017-12-29 MED ORDER — EPOETIN ALFA 40000 UNIT/ML IJ SOLN: 30000.0000 [IU] | INTRAMUSCULAR | Status: DC

## 2018-01-04 ENCOUNTER — Encounter: Payer: Medicare Other | Admitting: Internal Medicine

## 2018-01-04 DIAGNOSIS — Z940 Kidney transplant status: Secondary | ICD-10-CM | POA: Diagnosis not present

## 2018-01-04 DIAGNOSIS — E1121 Type 2 diabetes mellitus with diabetic nephropathy: Secondary | ICD-10-CM | POA: Diagnosis not present

## 2018-01-04 DIAGNOSIS — I10 Essential (primary) hypertension: Secondary | ICD-10-CM | POA: Diagnosis not present

## 2018-01-04 DIAGNOSIS — Z794 Long term (current) use of insulin: Secondary | ICD-10-CM | POA: Diagnosis not present

## 2018-01-04 DIAGNOSIS — R600 Localized edema: Secondary | ICD-10-CM | POA: Diagnosis not present

## 2018-01-04 DIAGNOSIS — L97522 Non-pressure chronic ulcer of other part of left foot with fat layer exposed: Secondary | ICD-10-CM | POA: Diagnosis not present

## 2018-01-04 DIAGNOSIS — E11621 Type 2 diabetes mellitus with foot ulcer: Secondary | ICD-10-CM | POA: Diagnosis not present

## 2018-01-05 ENCOUNTER — Encounter (HOSPITAL_COMMUNITY): Payer: Medicare Other

## 2018-01-06 NOTE — Progress Notes (Signed)
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 volume. Character of Wound/Ulcer Post Debridement is stable. Severity of Tissue Post Debridement is: Fat layer exposed. Post procedure Diagnosis Wound #4: Same as Pre-Procedure Plan Wound Cleansing: Wound #4 Left Toe Great: Clean wound with Normal Saline. Anesthetic (add to Medication List): Wound #4 Left Toe Great: Topical Lidocaine 4% cream applied to wound bed prior to debridement (In Clinic Only). Primary Wound Dressing: Wound #4 Left Toe Great: Silver Collagen Secondary Dressing: Wound #4 Left Toe GreatKADIAN, [name] ([id number]) Gauze and Kerlix/Conform Dressing Change Frequency: Wound #4 Left Toe Great: Change dressing every other day. Follow-up Appointments: Wound #4 Left Toe Great: Return Appointment in 1 week. Off-Loading: Wound #4 Left Toe Great: Other: - do not let toes rub in shoes. #1 no change to the dressing #2 Silver collagen Electronic Signature(s) Signed: 01/04/2018 4:09:45 PM By: Linton Ham MD Entered By: Linton Ham on 01/04/2018 15:32:12 [name]  ([id number]) -------------------------------------------------------------------------------- SuperBill Details Patient Name: [name] Date of Service: 01/04/2018 Medical Record Number: [id number] Patient Account Number: [id number] Date of Birth/Sex: [birth year] (32 y.o. 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 transplant status I10 Essential (primary) hypertension Facility Procedures CPT4 Code: [id number] Description: 59741 - DEB SUBQ TISSUE 20 SQ CM/< ICD-10 Diagnosis Description L97.522 Non-pressure chronic ulcer of other part of left foot with fat Modifier: layer exposed Quantity: 1 Physician Procedures CPT4 Code: 6384536 Description: 11042 - WC PHYS SUBQ TISS 20 SQ CM ICD-10 Diagnosis Description L97.522 Non-pressure chronic ulcer of other part of left foot with fat Modifier: layer exposed Quantity: 1 Electronic Signature(s) Signed: 01/04/2018 4:09:45 PM By: Linton Ham MD Entered By: Linton Ham on 01/04/2018 15:32:50

## 2018-01-06 NOTE — Progress Notes (Signed)
[name], [name] ([id number]) Visit Report for 01/04/2018 Arrival Information Details Patient Name: [name], [name] Date of Service: 01/04/2018 2:30 PM Medical Record Number: [id number] Patient Account Number: [id number] Date of Birth/Sex: [birth year] (32 y.o. F) Treating RN: Secundino Ginger Primary Care Caellum Mancil: [name] Other Clinician: Referring Debria Broecker: [name] Treating Shaka Zech/Extender: Tito Dine in Treatment: 4 Visit Information History Since Last Visit Added or deleted any medications: No Patient Arrived: Ambulatory Any new allergies or adverse reactions: No Arrival Time: 15:00 Had a fall or experienced change in No Accompanied By: self activities of daily living that may affect Transfer Assistance: None risk of falls: Patient Identification Verified: Yes Signs or symptoms of abuse/neglect since last visito No Secondary Verification Process Yes Hospitalized since last visit: No Completed: Implantable device outside of the clinic excluding No Patient Has Alerts: Yes cellular tissue based products placed in the center Patient Alerts: ABI 02/14/17 L 1.07 R since last visit: 1.11 Has Dressing in Place as Prescribed: Yes Pain Present Now: No Electronic Signature(s) Signed: 01/04/2018 4:05:05 PM By: Secundino Ginger Entered By: Secundino Ginger on 01/04/2018 15:01:11 [name] ([id number]) -------------------------------------------------------------------------------- Encounter Discharge Information Details Patient Name: [name] Date of Service: 01/04/2018 2:30 PM Medical Record Number: [id number] Patient Account Number: [id number] Date of Birth/Sex: [birth year] (32 y.o. F) Treating RN: Cornell Barman Primary Care Icholas Irby: [name] Other Clinician: Referring Ivann Trimarco: [name] Treating Tarun Patchell/Extender: Tito Dine in Treatment: 4 Encounter Discharge Information Items Discharge Condition: Stable Ambulatory Status:  Ambulatory Discharge Destination: Home Transportation: Other Accompanied By: self Schedule Follow-up Appointment: Yes Clinical Summary of Care: Post Procedure Vitals: Temperature (F): 98.0 Pulse (bpm): 86 Respiratory Rate (breaths/min): 16 Blood Pressure (mmHg): 142/94 Electronic Signature(s) Signed: 01/04/2018 4:45:51 PM By: Gretta Cool, BSN, RN, CWS, Kim RN, BSN Entered By: Gretta Cool, BSN, RN, CWS, Kim on 01/04/2018 15:29:23 [name] ([id number]) -------------------------------------------------------------------------------- Lower Extremity Assessment Details Patient Name: [name] Date of Service: 01/04/2018 2:30 PM Medical Record Number: [id number] Patient Account Number: [id number] Date of Birth/Sex: [birth year] (32 y.o. F) Treating RN: Secundino Ginger Primary Care Carter Kassel: [name] Other Clinician: Referring Zackarie Chason: [name] Treating Mirtha Jain/Extender: Tito Dine in Treatment: 4 Edema Assessment Assessed: [Left: No] [Right: No] [Left: Edema] [Right: :] Calf Left: Right: Point of Measurement: 35 cm From Medial Instep cm cm Ankle Left: Right: Point of Measurement: 11 cm From Medial Instep cm cm Vascular Assessment Claudication: Claudication Assessment [Left:None] Pulses: Dorsalis Pedis Palpable: [Left:Yes] Posterior Tibial Extremity colors, hair growth, and conditions: Extremity Color: [Left:Normal] Temperature of Extremity: [Left:Warm] Capillary Refill: [Left:< 3 seconds] Toe Nail Assessment Left: Right: Thick: Yes Discolored: Yes Deformed: No Improper Length and Hygiene: No Electronic Signature(s) Signed: 01/04/2018 4:05:05 PM By: Secundino Ginger Entered By: Secundino Ginger on 01/04/2018 15:10:20 [name] ([id number]) -------------------------------------------------------------------------------- Multi Wound Chart Details Patient Name: [name] Date of Service: 01/04/2018 2:30 PM Medical Record Number: [id number] Patient  Account Number: [id number] Date of Birth/Sex: 06-22-1985 (32 y.o. 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] [5:Diabetic Wound/Ulcer of the Lower Extremity] [  6:Diabetic Wound/Ulcer of the Lower Extremity] Comorbid History: [4:Anemia, Hypertension, Type II Diabetes, Osteoarthritis, Neuropathy, Seizure Disorder] [5:Anemia, Hypertension, Type II Diabetes, Osteoarthritis, Neuropathy, Seizure Disorder] [6:N/A] Date Acquired: [4:11/07/2017] [5:11/07/2017] [6:11/07/2017] Weeks of Treatment: [4:4] [5:4] [6:4] Wound Status: [4:Open] [5:Healed - Epithelialized] [6:Healed - Epithelialized] Wound Recurrence: [4:No] [5:No] [6:No] Pending Amputation on [4:Yes] [5:Yes] [6:Yes] Presentation: Measurements L x W x D [4:0.2x0.8x0.2] [5:0x0x0] [6:0x0x0] (cm) Area (cm) : [4:0.126] [5:0] [6:0] Volume (cm) : [4:0.025] [5:0] [6:0] % Reduction in Area: [4:95.40%] [5:100.00%] [6:100.00%] % Reduction in Volume: [4:96.90%] [5:100.00%] [6:100.00%] Classification: [4:Grade 1] [5:Grade 1] [6:Grade 1] Exudate Amount: [4:None Present] [5:None Present] [6:N/A] Wound Margin: [4:Flat and Intact] [5:Flat and Intact] [6:N/A] Granulation Amount: [4:None Present (0%)] [5:None Present (0%)] [6:N/A] Necrotic Amount: [4:None Present (0%)] [5:None Present (0%)] [6:N/A] Exposed Structures: [4:Fat Layer (Subcutaneous Tissue)  Exposed: Yes Fascia: No Tendon: No Muscle: No Joint: No Bone: No] [5:Fat Layer (Subcutaneous Tissue) Exposed: Yes Fascia: No Tendon: No Muscle: No Joint: No Bone: No] [6:N/A] Epithelialization: [4:None] [5:None] [6:N/A] Debridement: [4:Debridement - Excisional] [5:N/A] [6:N/A] Pre-procedure [4:15:20] [5:N/A] [6:N/A] Verification/Time Out Taken: Pain Control: [4:Lidocaine] [5:N/A] [6:N/A] Tissue Debrided: Subcutaneous, Slough N/A N/A Level: Skin/Subcutaneous Tissue N/A N/A Debridement Area (sq cm): 0.16 N/A N/A Instrument: Curette N/A N/A Bleeding: Minimum N/A N/A Hemostasis Achieved: Pressure N/A N/A Procedural Pain: 0 N/A N/A Post Procedural Pain: 0 N/A N/A Debridement Treatment Procedure was tolerated well N/A N/A Response: Post Debridement 0.2x0.8x0.3 N/A N/A Measurements L x W x D (cm) Post Debridement Volume: 0.038 N/A N/A (cm) Periwound Skin Texture: Scarring: Yes Callus: Yes No Abnormalities Noted Excoriation: No Excoriation: No Induration: No Induration: No Callus: No Crepitus: No Crepitus: No Rash: No Rash: No Scarring: No Periwound Skin Moisture: Maceration: No Maceration: No No Abnormalities Noted Dry/Scaly: No Dry/Scaly: No Periwound Skin Color: Atrophie Blanche: No Atrophie Blanche: No No Abnormalities Noted Cyanosis: No Cyanosis: No Ecchymosis: No Ecchymosis: No Erythema: No Erythema: No Hemosiderin Staining: No Hemosiderin Staining: No Mottled: No Mottled: No Pallor: No Pallor: No Rubor: No Rubor: No Temperature: No Abnormality No Abnormality N/A Tenderness on Palpation: No No No Wound Preparation: Ulcer Cleansing: Ulcer Cleansing: N/A Rinsed/Irrigated with Saline Rinsed/Irrigated with Saline Topical Anesthetic Applied: Topical Anesthetic Applied: Other: lidocaine 4% Other: lidocaine 4% Procedures Performed: Debridement N/A N/A Wound Number: [id number] N/A N/A Photos: No Photos N/A N/A Wound Location: Left Toe Third N/A N/A Wounding Event:  Gradually Appeared N/A N/A Primary Etiology: Diabetic Wound/Ulcer of the N/A N/A Lower Extremity Comorbid History: N/A N/A N/A Date Acquired: 11/07/2017 N/A N/A Weeks of Treatment: 4 N/A N/A Wound Status: Healed - Epithelialized N/A N/A Wound Recurrence: Yes N/A N/A Pending Amputation on No N/A N/A Presentation: Measurements L x W x D 0x0x0 N/A N/A (cm) Area (cm) : 0 N/A N/A Volume (cm) : 0 N/A N/A [name], TWOMBLY. ([id number]) % Reduction in Area: 100.00% N/A N/A % Reduction in Volume: 100.00% N/A N/A Classification: Grade 1 N/A N/A Exudate Amount: N/A N/A N/A Wound Margin: N/A N/A N/A Granulation Amount: N/A N/A N/A Necrotic Amount: N/A N/A N/A Exposed Structures: N/A N/A N/A Epithelialization: N/A N/A N/A Debridement: N/A N/A N/A Pain Control: N/A N/A N/A Tissue Debrided: N/A N/A N/A Level: N/A N/A N/A Debridement Area (sq cm): N/A N/A N/A Instrument: N/A N/A N/A Bleeding: N/A N/A N/A Hemostasis Achieved: N/A N/A N/A Procedural Pain: N/A N/A N/A Post Procedural Pain: N/A N/A N/A Debridement Treatment N/A N/A N/A Response: Post Debridement N/A N/A N/A Measurements L x W x  D (cm) Post Debridement Volume: N/A N/A N/A (cm) Periwound Skin Texture: No Abnormalities Noted N/A N/A Periwound Skin Moisture: No Abnormalities Noted N/A N/A Periwound Skin Color: No Abnormalities Noted N/A N/A Temperature: N/A N/A N/A Tenderness on Palpation: No N/A N/A Wound Preparation: N/A N/A N/A Procedures Performed: N/A N/A N/A Treatment Notes Electronic Signature(s) Signed: 01/04/2018 4:09:45 PM By: Linton Ham MD Entered By: Linton Ham on 01/04/2018 15:27:19 [name] ([id number]) -------------------------------------------------------------------------------- [place] Details Patient Name: [name] Date of Service: 01/04/2018 2:30 PM Medical Record Number: [id number] Patient Account Number: [id number] Date of Birth/Sex: 1986-02-26 (32  y.o. F) Treating RN: Cornell Barman Primary Care Kyrsten Deleeuw: [name] Other Clinician: Referring Fitzgerald Dunne: [name] Treating Kaydi Kley/Extender: Tito Dine in Treatment: 4 Active Inactive ` Necrotic Tissue Nursing Diagnoses: Impaired tissue integrity related to necrotic/devitalized tissue Goals: Necrotic/devitalized tissue will be minimized in the wound bed Date Initiated: 12/01/2017 Target Resolution Date: 12/16/2017 Goal Status: Active Interventions: Assess patient pain level pre-, during and post procedure and prior to discharge Treatment Activities: Apply topical anesthetic as ordered : 12/01/2017 Notes: ` Orientation to the Wound Care Program Nursing Diagnoses: Knowledge deficit related to the wound healing center program Goals: Patient/caregiver will verbalize understanding of the [place] Date Initiated: 12/01/2017 Target Resolution Date: 12/31/2017 Goal Status: Active Interventions: Provide education on orientation to the wound center Notes: ` Wound/Skin Impairment Nursing Diagnoses: Impaired tissue integrity Goals: Patient/caregiver will verbalize understanding of skin care regimen Date Initiated: 12/01/2017 Target Resolution Date: 12/31/2017 [name], [name] ([id number]) Goal Status: Active Interventions: Assess ulceration(s) every visit Notes: Electronic Signature(s) Signed: 01/04/2018 4:45:51 PM By: Gretta Cool, BSN, RN, CWS, Kim RN, BSN Entered By: Gretta Cool, BSN, RN, CWS, Kim on 01/04/2018 15:19:02 [name] ([id number]) -------------------------------------------------------------------------------- Pain Assessment Details Patient Name: [name] Date of Service: 01/04/2018 2:30 PM Medical Record Number: [id number] Patient Account Number: [id number] Date of Birth/Sex: 1986/01/17 (32 y.o. F) Treating RN: Secundino Ginger Primary Care Sharlize Hoar: [name] Other Clinician: Referring Croy Drumwright: [name] Treating  Clarece Drzewiecki/Extender: Tito Dine in Treatment: 4 Active Problems Location of Pain Severity and Description of Pain Patient Has Paino No Site Locations Pain Management and Medication Current Pain Management: Goals for Pain Management pt denies any pain at this time. Electronic Signature(s) Signed: 01/04/2018 4:05:05 PM By: Secundino Ginger Entered By: Secundino Ginger on 01/04/2018 15:01:31 [name] ([id number]) -------------------------------------------------------------------------------- Patient/Caregiver Education Details Patient Name: [name] Date of Service: 01/04/2018 2:30 PM Medical Record Number: [id number] Patient Account Number: [id number] Date of Birth/Gender: 1985/09/13 (32 y.o. F) Treating RN: Cornell Barman Primary Care Physician: [name] Other Clinician: Referring Physician: [name] Treating Physician/Extender: Tito Dine in Treatment: 4 Education Assessment Education Provided To: Patient Education Topics Provided Offloading: Handouts: Other: do not let shoes rub in shoes Wound/Skin Impairment: Handouts: Caring for Your Ulcer Methods: Demonstration, Explain/Verbal Responses: State content correctly Electronic Signature(s) Signed: 01/04/2018 4:45:51 PM By: Gretta Cool, BSN, RN, CWS, Kim RN, BSN Entered By: Gretta Cool, BSN, RN, CWS, Kim on 01/04/2018 15:23:13 [name] ([id number]) -------------------------------------------------------------------------------- Wound Assessment Details Patient Name: [name] Date of Service: 01/04/2018 2:30 PM Medical Record Number: [id number] Patient Account Number: [id number] Date of Birth/Sex: Jul 22, 1985 (32 y.o. F) Treating RN: Secundino Ginger Primary Care Nikiya Starn: [name] Other Clinician: Referring Seith Aikey: [name] Treating Claryssa Sandner/Extender: Tito Dine in Treatment: 4 Wound Status Wound Number: 4 Primary Diabetic Wound/Ulcer of the Lower  Extremity Etiology: Wound Location: Left Toe  Great Wound Open Wounding Event: Gradually Appeared Status: Date Acquired: 11/07/2017 Comorbid Anemia, Hypertension, Type II Diabetes, Weeks Of Treatment: 4 History: Osteoarthritis, Neuropathy, Seizure Disorder Clustered Wound: No Pending Amputation On Presentation Photos Photo Uploaded By: Secundino Ginger on 01/04/2018 15:53:07 Wound Measurements Length: (cm) 0.2 Width: (cm) 0.8 Depth: (cm) 0.2 Area: (cm) 0.126 Volume: (cm) 0.025 % Reduction in Area: 95.4% % Reduction in Volume: 96.9% Epithelialization: None Tunneling: No Undermining: No Wound Description Classification: Grade 1 Foul Odo Wound Margin: Flat and Intact Slough/F Exudate Amount: None Present r After Cleansing: No ibrino Yes Wound Bed Granulation Amount: None Present (0%) Exposed Structure Necrotic Amount: None Present (0%) Fascia Exposed: No Fat Layer (Subcutaneous Tissue) Exposed: Yes Tendon Exposed: No Muscle Exposed: No Joint Exposed: No Bone Exposed: No Periwound Skin Texture Texture Color [name], STUCKERT. ([id number]) No Abnormalities Noted: No No Abnormalities Noted: No Callus: No Atrophie Blanche: No Crepitus: No Cyanosis: No Excoriation: No Ecchymosis: No Induration: No Erythema: No Rash: No Hemosiderin Staining: No Scarring: Yes Mottled: No Pallor: No Moisture Rubor: No No Abnormalities Noted: No Dry / Scaly: No Temperature / Pain Maceration: No Temperature: No Abnormality Wound Preparation Ulcer Cleansing: Rinsed/Irrigated with Saline Topical Anesthetic Applied: Other: lidocaine 4%, Treatment Notes Wound #4 (Left Toe Great) 1. Cleansed with: Clean wound with Normal Saline 2. Anesthetic Topical Lidocaine 4% cream to wound bed prior to debridement 4. Dressing Applied: Prisma Ag 5. Secondary Dressing Applied Kerlix/Conform Electronic Signature(s) Signed: 01/04/2018 4:05:05 PM By: Secundino Ginger Entered By: Secundino Ginger on 01/04/2018  15:07:02 [name] ([id number]) -------------------------------------------------------------------------------- Wound Assessment Details Patient Name: [name] Date of Service: 01/04/2018 2:30 PM Medical Record Number: [id number] Patient Account Number: [id number] Date of Birth/Sex: 1985-08-26 (32 y.o. F) Treating RN: Cornell Barman Primary Care Alexavier Tsutsui: [name] Other Clinician: Referring Jodene Polyak: [name] Treating Jailan Trimm/Extender: Tito Dine in Treatment: 4 Wound Status Wound Number: 5 Primary Diabetic Wound/Ulcer of the Lower Extremity Etiology: Wound Location: Left, Medial Toe Second Wound Healed - Epithelialized Wounding Event: Gradually Appeared Status: Date Acquired: 11/07/2017 Comorbid Anemia, Hypertension, Type II Diabetes, Weeks Of Treatment: 4 History: Osteoarthritis, Neuropathy, Seizure Disorder Clustered Wound: No Pending Amputation On Presentation Wound Measurements Length: (cm) 0 % Re Width: (cm) 0 % Re Depth: (cm) 0 Epit Area: (cm) 0 Tun Volume: (cm) 0 Und duction in Area: 100% duction in Volume: 100% helialization: None neling: No ermining: No Wound Description Classification: Grade 1 Foul Wound Margin: Flat and Intact Slou Exudate Amount: None Present Odor After Cleansing: No gh/Fibrino No Wound Bed Granulation Amount: None Present (0%) Exposed Structure Necrotic Amount: None Present (0%) Fascia Exposed: No Fat Layer (Subcutaneous Tissue) Exposed: Yes Tendon Exposed: No Muscle Exposed: No Joint Exposed: No Bone Exposed: No Periwound Skin Texture Texture Color No Abnormalities Noted: No No Abnormalities Noted: No Callus: Yes Atrophie Blanche: No Crepitus: No Cyanosis: No Excoriation: No Ecchymosis: No Induration: No Erythema: No Rash: No Hemosiderin Staining: No Scarring: No Mottled: No Pallor: No Moisture Rubor: No No Abnormalities Noted: No Dry / Scaly: No Temperature / Pain Maceration:  No Temperature: No Abnormality Wound Preparation [name], [name] ([id number]) Ulcer Cleansing: Rinsed/Irrigated with Saline Topical Anesthetic Applied: Other: lidocaine 4%, Electronic Signature(s) Signed: 01/04/2018 4:45:51 PM By: Gretta Cool, BSN, RN, CWS, Kim RN, BSN Entered By: Gretta Cool, BSN, RN, CWS, Kim on 01/04/2018 15:20:08 [name] ([id number]) -------------------------------------------------------------------------------- Wound Assessment Details Patient Name: [name] Date of Service: 01/04/2018 2:30 PM Medical Record Number: [id number] Patient Account Number: [id number] Date  of Birth/Sex: [birth year] (32 y.o. F) Treating RN: Secundino Ginger Primary Care Lakeesha Fontanilla: [name] Other Clinician: Referring Aron Inge: [name] Treating Ardena Gangl/Extender: Tito Dine in Treatment: 4 Wound Status Wound Number: 6 Primary Diabetic Wound/Ulcer of the Lower Etiology: Extremity Wound Location: Left, Distal Toe Second Wound Status: Healed - Epithelialized Wounding Event: Gradually Appeared Date Acquired: 11/07/2017 Weeks Of Treatment: 4 Clustered Wound: No Pending Amputation On Presentation Photos Photo Uploaded By: Secundino Ginger on 01/04/2018 15:54:32 Wound Measurements Length: (cm) 0 % Re Width: (cm) 0 % Re Depth: (cm) 0 Area: (cm) 0 Volume: (cm) 0 duction in Area: 100% duction in Volume: 100% Wound Description Classification: Grade 1 Periwound Skin Texture Texture Color No Abnormalities Noted: No No Abnormalities Noted: No Moisture No Abnormalities Noted: No Electronic Signature(s) Signed: 01/04/2018 4:05:05 PM By: Secundino Ginger Entered By: Secundino Ginger on 01/04/2018 15:09:52 [name] ([id number]) -------------------------------------------------------------------------------- Wound Assessment Details Patient Name: [name] Date of Service: 01/04/2018 2:30 PM Medical Record Number: [id number] Patient Account Number: [id number] Date of  Birth/Sex: 04-Jan-1986 (32 y.o. F) Treating RN: Secundino Ginger Primary Care Lynnsey Barbara: [name] Other Clinician: Referring Aeryn Medici: [name] Treating Leeba Barbe/Extender: Tito Dine in Treatment: 4 Wound Status Wound Number: 7R Primary Diabetic Wound/Ulcer of the Lower Etiology: Extremity Wound Location: Left Toe Third Wound Status: Healed - Epithelialized Wounding Event: Gradually Appeared Date Acquired: 11/07/2017 Weeks Of Treatment: 4 Clustered Wound: No Wound Measurements Length: (cm) 0 % Re Width: (cm) 0 % Re Depth: (cm) 0 Area: (cm) 0 Volume: (cm) 0 duction in Area: 100% duction in Volume: 100% Wound Description Classification: Grade 1 Periwound Skin Texture Texture Color No Abnormalities Noted: No No Abnormalities Noted: No Moisture No Abnormalities Noted: No Electronic Signature(s) Signed: 01/04/2018 4:05:05 PM By: Secundino Ginger Entered By: Secundino Ginger on 01/04/2018 15:09:39 [name] ([id number]) -------------------------------------------------------------------------------- [place] Details Patient Name: [name] Date of Service: 01/04/2018 2:30 PM Medical Record Number: [id number] Patient Account Number: [id number] Date of Birth/Sex: 1985-06-01 (32 y.o. F) Treating RN: Secundino Ginger Primary Care Cellie Dardis: [name] Other Clinician: Referring Ashaz Robling: [name] Treating Jaeger Trueheart/Extender: Tito Dine in Treatment: 4 Vital Signs Time Taken: 15:00 Temperature (F): 98.0 Height (in): 66 Pulse (bpm): 86 Weight (lbs): 174 Respiratory Rate (breaths/min): 16 Body Mass Index (BMI): 28.1 Blood Pressure (mmHg): 142/94 Reference Range: 80 - 120 mg / dl Electronic Signature(s) Signed: 01/04/2018 4:05:05 PM By: Secundino Ginger Entered BySecundino Ginger on 01/04/2018 15:02:24

## 2018-01-12 ENCOUNTER — Ambulatory Visit (HOSPITAL_COMMUNITY)
Admission: RE | Admit: 2018-01-12 | Discharge: 2018-01-12 | Disposition: A | Discharge status: Home / Self Care | Payer: Medicare Other | Source: Ambulatory Visit | Attending: Nephrology | Admitting: Nephrology

## 2018-01-12 VITALS — BP 131/91 | HR 106 | Resp 18

## 2018-01-12 DIAGNOSIS — N182 Chronic kidney disease, stage 2 (mild): Secondary | ICD-10-CM

## 2018-01-12 DIAGNOSIS — D631 Anemia in chronic kidney disease: Secondary | ICD-10-CM | POA: Diagnosis not present

## 2018-01-12 DIAGNOSIS — N183 Chronic kidney disease, stage 3 (moderate): Secondary | ICD-10-CM | POA: Insufficient documentation

## 2018-01-12 LAB — POCT HEMOGLOBIN-HEMACUE: Hemoglobin: 8.5 g/dL — ABNORMAL LOW (ref 12.0–15.0)

## 2018-01-12 MED ORDER — EPOETIN ALFA 20000 UNIT/ML IJ SOLN
INTRAMUSCULAR | Status: AC
  Administered 2018-01-12: 20000 [IU]
  Filled 2018-01-12: qty 1

## 2018-01-12 MED ORDER — EPOETIN ALFA 40000 UNIT/ML IJ SOLN: 30000.0000 [IU] | INTRAMUSCULAR | Status: DC

## 2018-01-12 MED ORDER — EPOETIN ALFA 10000 UNIT/ML IJ SOLN
INTRAMUSCULAR | Status: AC
  Administered 2018-01-12: 10000 [IU]
  Filled 2018-01-12: qty 1

## 2018-01-18 ENCOUNTER — Ambulatory Visit: Payer: Medicare Other | Admitting: Internal Medicine

## 2018-01-19 DIAGNOSIS — L97529 Non-pressure chronic ulcer of other part of left foot with unspecified severity: Secondary | ICD-10-CM | POA: Diagnosis not present

## 2018-01-19 DIAGNOSIS — N390 Urinary tract infection, site not specified: Secondary | ICD-10-CM | POA: Diagnosis not present

## 2018-01-19 DIAGNOSIS — E11621 Type 2 diabetes mellitus with foot ulcer: Secondary | ICD-10-CM | POA: Diagnosis not present

## 2018-01-19 DIAGNOSIS — N189 Chronic kidney disease, unspecified: Secondary | ICD-10-CM | POA: Diagnosis not present

## 2018-01-19 DIAGNOSIS — R609 Edema, unspecified: Secondary | ICD-10-CM | POA: Diagnosis not present

## 2018-01-19 DIAGNOSIS — N059 Unspecified nephritic syndrome with unspecified morphologic changes: Secondary | ICD-10-CM | POA: Diagnosis not present

## 2018-01-19 DIAGNOSIS — T8619 Other complication of kidney transplant: Secondary | ICD-10-CM | POA: Diagnosis not present

## 2018-01-19 DIAGNOSIS — E139 Other specified diabetes mellitus without complications: Secondary | ICD-10-CM | POA: Diagnosis not present

## 2018-01-19 DIAGNOSIS — I10 Essential (primary) hypertension: Secondary | ICD-10-CM | POA: Diagnosis not present

## 2018-01-19 DIAGNOSIS — T8691 Unspecified transplanted organ and tissue rejection: Secondary | ICD-10-CM | POA: Diagnosis not present

## 2018-01-19 DIAGNOSIS — Z940 Kidney transplant status: Secondary | ICD-10-CM | POA: Diagnosis not present

## 2018-01-19 DIAGNOSIS — D631 Anemia in chronic kidney disease: Secondary | ICD-10-CM | POA: Diagnosis not present

## 2018-01-25 ENCOUNTER — Other Ambulatory Visit (HOSPITAL_COMMUNITY): Payer: Self-pay | Admitting: *Deleted

## 2018-01-25 ENCOUNTER — Encounter: Payer: Medicare Other | Attending: Internal Medicine | Admitting: Internal Medicine

## 2018-01-25 DIAGNOSIS — E11621 Type 2 diabetes mellitus with foot ulcer: Secondary | ICD-10-CM | POA: Diagnosis not present

## 2018-01-25 DIAGNOSIS — I10 Essential (primary) hypertension: Secondary | ICD-10-CM | POA: Insufficient documentation

## 2018-01-25 DIAGNOSIS — M1990 Unspecified osteoarthritis, unspecified site: Secondary | ICD-10-CM | POA: Diagnosis not present

## 2018-01-25 DIAGNOSIS — D649 Anemia, unspecified: Secondary | ICD-10-CM | POA: Diagnosis not present

## 2018-01-25 DIAGNOSIS — G629 Polyneuropathy, unspecified: Secondary | ICD-10-CM | POA: Diagnosis not present

## 2018-01-25 DIAGNOSIS — Z794 Long term (current) use of insulin: Secondary | ICD-10-CM | POA: Insufficient documentation

## 2018-01-25 DIAGNOSIS — L97522 Non-pressure chronic ulcer of other part of left foot with fat layer exposed: Secondary | ICD-10-CM | POA: Insufficient documentation

## 2018-01-25 DIAGNOSIS — E10621 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: Secondary | ICD-10-CM | POA: Diagnosis present

## 2018-01-25 DIAGNOSIS — Z940 Kidney transplant status: Secondary | ICD-10-CM | POA: Diagnosis not present

## 2018-01-25 DIAGNOSIS — G40909 Epilepsy, unspecified, not intractable, without status epilepticus: Secondary | ICD-10-CM | POA: Insufficient documentation

## 2018-01-26 ENCOUNTER — Ambulatory Visit (HOSPITAL_COMMUNITY)
Admission: RE | Admit: 2018-01-26 | Discharge: 2018-01-26 | Disposition: A | Discharge status: Home / Self Care | Payer: Medicare Other | Source: Ambulatory Visit | Attending: Nephrology | Admitting: Nephrology

## 2018-01-26 VITALS — BP 122/96 | HR 98 | Resp 16

## 2018-01-26 DIAGNOSIS — N182 Chronic kidney disease, stage 2 (mild): Secondary | ICD-10-CM | POA: Diagnosis not present

## 2018-01-26 LAB — POCT HEMOGLOBIN-HEMACUE: HEMOGLOBIN: 9.1 g/dL — AB (ref 12.0–15.0)

## 2018-01-26 MED ORDER — EPOETIN ALFA 40000 UNIT/ML IJ SOLN
INTRAMUSCULAR | Status: AC
  Administered 2018-01-26: 40000 [IU]
  Filled 2018-01-26: qty 1

## 2018-01-26 MED ORDER — EPOETIN ALFA 40000 UNIT/ML IJ SOLN: 30000.0000 [IU] | INTRAMUSCULAR | Status: DC

## 2018-01-27 NOTE — Progress Notes (Signed)
TEKEYA, GEFFERT ([id number]) Visit Report for 01/25/2018 HPI Details Patient Name: [name], [name] Date of Service: 01/25/2018 2:15 PM Medical Record Number: [id number] Patient Account Number: [id number] Date of Birth/Sex: 1985-06-22 (32 y.o. F) Treating RN: Cornell Barman Primary Care Provider: [name] Other Clinician: Referring Provider: [name] Treating Provider/Extender: Tito Dine in Treatment: 7 History of Present Illness HPI Description: 02/14/17 on evaluation today patient appears to be doing fairly well all things considered. She tells me that around the middle of November she was sleeping close to a space heater when she woke up and sustained a burn to her right first toe. She has a history of diabetes which is uncontrolled her last hemoglobin A1c with 11.5 on December 12, 2016. She is status post having had a kidney transplant and this was necessitated by apparently a high dose of antibiotics given to her as a child. Overall she has been tolerating the wound fairly well all things considered she has been putting antibiotic ointment on the area but otherwise no other treatment. She did go to the ER twice the station left without being seen due to the long wait. She continues to have discomfort rated to be 3-4/10 which is worse with toucher cleansing of the wound. 02/24/2017 -- she has uncontrolled diabetes mellitus with hyperglycemia and her last hemoglobin A1c was 14%. I have asked her to be more careful and see her PCP regarding this. She is also not wearing bilateral compression stockings as recommended before. 03/18/17 on evaluation today patient appears to be doing very well and in fact her wound appears to be completely healed. She has been tolerating the dressing changes without complication. Fortunately she is having no pain. *** 05/26/17 patient seen today for reevaluation concerning her right great toe ulcer. She has previously been evaluated by  myself in January through the beginning of the year before subsequently being discharged. She was completely healed at that point. Unfortunately patient tells me that she's unsure of exactly what happened but this wound has reopened. Upon hearing her story it sounds as if she likely injured this utilizing a pumis stone that she uses to work on her calluses. At one point she even asked me if there was a different way that she could potentially work on all of the callous and dry skin on her foot other than utilizing the stone. With that being said her story at this point was that she felt that she may have burnt her foot specifically the great toe on a space heater that she keeps on the bed stand beside her bed. With that being said I do not think that's very likely the toes around and all the surrounding region does not appear to show any signs of thermal injury nor does this ulcer appear to really be thermal in nature. It very much looks more like a diabetic foot ulcer. Patient's most recent hemoglobin A1c was 11.2 that was in January 2019. She has not had this check since she tells me that her blood sugars run in the "300s". Otherwise not much has really changed since I saw her previously. 06/02/17- She is here in follow-up evaluation for right great toe ulcer. Plain film x-ray revealed evidence worrisome for osteomyelitis, will order MRI; we discussed these findings. She presents to the clinic with feelings of hypoglycemia, she was provided with an orange juice in her glucose was 83; despite this being a normal glucose level am not surprised she is having hypoglycemic symptoms.  She tolerated debridement. We discussed the need for tight glycemic control (her a1c has been 11 in Nov and Jan), offloading/reduced trauma and compliance with medical treatment plan. A consult for ID was placed 06/09/17-She is here in follow-up evaluation for right great toe ulcer. Her MRI is scheduled for tomorrow. The wound  is significantly improved with granulation tissue encompassing most of the wound, small amount of nonviable tissue close to the nail with uneven coloration of the toenail itself, currently no lifting. She has an appointment with podiatry and endocrinology on 4/9; an appointment with Dr. Ola Spurr on 4/11. I prescribed Levaquin last week which she has not started. Due to the improvement of the wound with granulation tissue, I cultured the wound after debridement and we will hold off on initiating Levaquin. I will reach out to her nephrologist, Dr. Lorrene Reid regarding antibiotic selection. If the MRI is negative for osteomyelitis we will cancel her appointment with Dr. Ola Spurr. She states she has been more diligent in maintaining better glycemic control with more levels being below 200 then over; she has been encouraged to maintain this for continued wound healing. 06/16/17-She is here in follow up evaluation for right great toe ulcer. 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 She does have an appointment with Dr. Ola Spurr on 4/11. Culture that was obtained last week grew oxacillin sensitive staph aureus, she was initiated on the Levaquin that was originally ordered on 3/21. She admits to taking her loading dose on Monday 4/1 and starting her 250 mg daily dose on 4/2. We will extend the Levaquin 250 mg daily through her appointment with Dr. Ola Spurr. [name], [name] ([id number]) There continues to be improvement in both measurements and appearance, we will transition from Santyl to [facility]. She states her glucose levels are consistently less than 200 with fewer times greater than 200. She will follow-up next week Readmission: 12/01/17 on evaluation today patient presents for reevaluation due to ulcers on the left foot. She tells me at this point though honestly she is a poor historian but she is unsure when these all showed up. She's been tolerating the dressing changes without complication using just  a in the ointment and a Band-Aid as needed. With that being said she did become somewhat concerned about these and therefore made the appointment to come in for further evaluation with [place]. Fortunately there is no evidence of infection at this time. 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 Currently all the wounds on the left. No fevers, chills, nausea, or vomiting noted at this time. Patientos last A1c was 8.15 October 2017. 12/08/17 on evaluation today patient actually appears to be doing much better in regard to her wounds in general. In fact the Santyl seems to have done very well as far as losing up the necrotic material at this point in time and overall I do feel she's made great progress. One of the areas appears to have healed the other three are all doing better. 12/21/17 patient is a 32 year old woman who is listed in our record is a type II diabetic although in [place] as a type I diabetic. In any case she is on insulin. She has wounds on her left foot including the dorsal left first toe medial left second toe and a thickened eschar on the left third toe. We've been using silver collagen. It doesn't appear that she is actually offloading these. 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 She still has an open area over the proximal phalanx of the left great toe. been using silver collagen 01/25/18; the patient is missed some appointments. The areas on her left second and third toe remain healed. The open area over the proximal phalanx of the left great toe apparently was healed last week as well. Then the patient noted a blister form and she became concerned and came back into the clinic.She is back in ordinary footwear. Electronic Signature(s) Signed: 01/25/2018 5:24:02 PM By: Linton Ham MD Entered By: Linton Ham on 01/25/2018 16:50:15 [name]  ([id number]) -------------------------------------------------------------------------------- Physical Exam Details Patient Name: [name] Date of Service: 01/25/2018 2:15 PM Medical Record Number: [id number] Patient Account Number: [id number] Date of Birth/Sex: Jan 26, 1986 (32 y.o. F) Treating RN: Cornell Barman Primary Care Provider: [name] Other Clinician: Referring Provider: [name] Treating Provider/Extender: Tito Dine in Treatment: 7 Constitutional Patient is hypertensive.. Pulse regular and within target range for patient.Marland Kitchen Respirations regular, non-labored and within target range.. Temperature is normal and within the target range for the patient.Marland Kitchen appears in no distress. Cardiovascular Pedal pulses are palpable. Notes Wound exam; she does not have an open wound on the great toe proximal phalanx however she does have a blister. There is no convincing evidence of infection. The interphalangeal joint seems stable. There is no tenderness no crepitus. Electronic Signature(s) Signed: 01/25/2018 5:24:02 PM By: Linton Ham MD Entered By: Linton Ham on 01/25/2018 16:53:38 [name] ([id number]) -------------------------------------------------------------------------------- Physician Orders Details Patient Name: [name] Date of Service: 01/25/2018 2:15 PM Medical Record Number: [id number] Patient Account Number: [id number] Date of Birth/Sex: August 20, 1985 (32 y.o. F) Treating RN: Cornell Barman Primary Care Provider: [name] Other Clinician: Referring Provider: [name] Treating Provider/Extender: Tito Dine in Treatment: 7 Verbal / Phone Orders: No Diagnosis Coding Wound Cleansing Wound #4 Left Toe Great o Clean wound with Normal Saline. Anesthetic (add to Medication List) Wound #4 Left Toe Great o Topical Lidocaine 4% cream applied to wound bed prior to debridement (In Clinic Only). Primary Wound  Dressing Wound #4 Left Toe Great o Silver Alginate Secondary Dressing Wound #4 Left Toe Great o Gauze and Kerlix/Conform Dressing Change Frequency Wound #4 Left Toe Great o Change Dressing Monday, Wednesday, Friday Follow-up Appointments Wound #4 Left Toe Great o Return Appointment in 1 week. Off-Loading Wound #4 Left Toe Great o Other: - do not let toes rub in shoes. Electronic Signature(s) Signed: 01/25/2018 5:24:02 PM By: Linton Ham MD Signed: 01/25/2018 5:34:01 PM By: Gretta Cool, BSN, RN, CWS, Kim RN, BSN Entered By: Gretta Cool, BSN, RN, CWS, Kim on 01/25/2018 15:16:44 [name] ([id number]) -------------------------------------------------------------------------------- Problem List Details Patient Name: [name] Date of Service: 01/25/2018 2:15 PM Medical Record Number: [id number] Patient Account Number: [id number] Date of Birth/Sex: 10/18/1985 (32 y.o. F) Treating RN: Cornell Barman Primary Care Provider: [name] Other Clinician: Referring Provider: [name] Treating Provider/Extender: Tito Dine in Treatment: 7 Active Problems ICD-10 Evaluated Encounter Code Description Active Date Today Diagnosis E11.621 Type 2 diabetes mellitus with foot ulcer 12/01/2017 No Yes L97.522 Non-pressure chronic ulcer of other part of left foot with fat 12/01/2017 No Yes layer exposed Z94.0 Kidney transplant status 12/01/2017 No Yes I10 Essential (primary) hypertension 12/01/2017 No Yes Inactive Problems Resolved Problems Electronic Signature(s) Signed: 01/25/2018 5:24:02 PM By: Linton Ham MD Entered By: Linton Ham on 01/25/2018 16:46:38 [name] ([id number]) -------------------------------------------------------------------------------- Progress Note Details Patient Name:  [name] Date of Service: 01/25/2018 2:15 PM Medical Record Number: [id number] Patient Account Number: [id number] Date of Birth/Sex: [birth year] (32 y.o.  F) Treating RN: Cornell Barman Primary Care Provider: [name] Other Clinician: Referring Provider: [name] Treating Provider/Extender: Tito Dine in Treatment: 7 Subjective History of Present Illness (HPI) 02/14/17 on evaluation today patient appears to be doing fairly well all things considered. She tells me that around the middle of November she was sleeping close to a space heater when she woke up and sustained a burn to her right first toe. She has a history of diabetes which is uncontrolled her last hemoglobin A1c with 11.5 on December 12, 2016. She is status post having had a kidney transplant and this was necessitated by apparently a high dose of antibiotics given to her as a child. Overall she has been tolerating the wound fairly well all things considered she has been putting antibiotic ointment on the area but otherwise no other treatment. She did go to the ER twice the station left without being seen due to the long wait. She continues to have discomfort rated to be 3-4/10 which is worse with toucher cleansing of the wound. 02/24/2017 -- she has uncontrolled diabetes mellitus with hyperglycemia and her last hemoglobin A1c was 14%. I have asked her to be more careful and see her PCP regarding this. She is also not wearing bilateral compression stockings as recommended before. 03/18/17 on evaluation today patient appears to be doing very well and in fact her wound appears to be completely healed. She has been tolerating the dressing changes without complication. Fortunately she is having no pain. *** 05/26/17 patient seen today for reevaluation concerning her right great toe ulcer. She has previously been evaluated by myself in January through the beginning of the year before subsequently being discharged. She was completely healed at that point. Unfortunately patient tells me that she's unsure of exactly what happened but this wound has reopened. Upon hearing  her story it sounds as if she likely injured this utilizing a pumis stone that she uses to work on her calluses. At one point she even asked me if there was a different way that she could potentially work on all of the callous and dry skin on her foot other than utilizing the stone. With that being said her story at this point was that she felt that she may have burnt her foot specifically the great toe on a space heater that she keeps on the bed stand beside her bed. With that being said I do not think that's very likely the toes around and all the surrounding region does not appear to show any signs of thermal injury nor does this ulcer appear to really be thermal in nature. It very much looks more like a diabetic foot ulcer. Patient's most recent hemoglobin A1c was 11.2 that was in January 2019. She has not had this check since she tells me that her blood sugars run in the "300s". Otherwise not much has really changed since I saw her previously. 06/02/17- She is here in follow-up evaluation for right great toe ulcer. Plain film x-ray revealed evidence worrisome for osteomyelitis, will order MRI; we discussed these findings. She presents to the clinic with feelings of hypoglycemia, she was provided with an orange juice in her glucose was 83; despite this being a normal glucose level am not surprised she is having hypoglycemic symptoms. She tolerated debridement. We discussed the need for tight glycemic control (  her a1c has been 11 in Nov and Jan), offloading/reduced trauma and compliance with medical treatment plan. A consult for ID was placed 06/09/17-She is here in follow-up evaluation for right great toe ulcer. Her MRI is scheduled for tomorrow. The wound is significantly improved with granulation tissue encompassing most of the wound, small amount of nonviable tissue close to the nail with uneven coloration of the toenail itself, currently no lifting. She has an appointment with podiatry and  endocrinology on 4/9; an appointment with Dr. Ola Spurr on 4/11. I prescribed Levaquin last week which she has not started. Due to the improvement of the wound with granulation tissue, I cultured the wound after debridement and we will hold off on initiating Levaquin. I will reach out to her nephrologist, Dr. Lorrene Reid regarding antibiotic selection. If the MRI is negative for osteomyelitis we will cancel her appointment with Dr. Ola Spurr. She states she has been more diligent in maintaining better glycemic control with more levels being below 200 then over; she has been encouraged to maintain this for continued wound healing. 06/16/17-She is here in follow up evaluation for right great toe ulcer. 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 She does have an appointment with Dr. Ola Spurr on 4/11. Culture that was obtained last week grew oxacillin sensitive staph aureus, she was initiated on the Levaquin that was originally ordered on 3/21. She admits to taking her loading dose on Monday 4/1 and starting her 250 mg daily dose on 4/2. We will extend the Levaquin 250 mg daily through her appointment with Dr. Ola Spurr. There continues to be improvement in both measurements and appearance, we will transition from Santyl to [facility]. She states her glucose levels are consistently less than 200 with fewer times greater than 200. She will follow-up next week [name], [name] ([id number]) Readmission: 12/01/17 on evaluation today patient presents for reevaluation due to ulcers on the left foot. She tells me at this point though honestly she is a poor historian but she is unsure when these all showed up. She's been tolerating the dressing changes without complication using just a in the ointment and a Band-Aid as needed. With that being said she did become somewhat concerned about these and therefore made the appointment to come in for further evaluation with [place]. Fortunately there is no evidence of infection at this  time. 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 Currently all the wounds on the left. No fevers, chills, nausea, or vomiting noted at this time. Patient s last A1c was 8.15 October 2017. 12/08/17 on evaluation today patient actually appears to be doing much better in regard to her wounds in general. In fact the Santyl seems to have done very well as far as losing up the necrotic material at this point in time and overall I do feel she's made great progress. One of the areas appears to have healed the other three are all doing better. 12/21/17 patient is a 32 year old woman who is listed in our record is a type II diabetic although in [place] as a type I diabetic. In any case she is on insulin. She has wounds on her left foot including the dorsal left first toe medial left second toe and a thickened eschar on the left third toe. We've been using silver collagen. It doesn't appear that she is actually offloading these. 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 She still has  an open area over the proximal phalanx of the left great toe. been using silver collagen 01/25/18; the patient is missed some appointments. The areas on her left second and third toe remain healed. The open area over the proximal phalanx of the left great toe apparently was healed last week as well. Then the patient noted a blister form and she became concerned and came back into the clinic.She is back in ordinary footwear. Objective Constitutional Patient is hypertensive.. Pulse regular and within target range for patient.Marland Kitchen Respirations regular, non-labored and within target range.. Temperature is normal and within the target range for the patient.Marland Kitchen appears in no distress. Vitals Time Taken: 2:45 PM, Height: 66 in, Weight: 174 lbs, BMI: 28.1, Temperature: 98.1 F, Pulse: 95 bpm, Respiratory Rate: 16 breaths/min, Blood Pressure:  168/103 mmHg. Cardiovascular Pedal pulses are palpable. General Notes: Wound exam; she does not have an open wound on the great toe proximal phalanx however she does have a blister. There is no convincing evidence of infection. The interphalangeal joint seems stable. There is no tenderness no crepitus. Integumentary (Hair, Skin) Wound #4 status is Open. Original cause of wound was Gradually Appeared. The wound is located on the Left Toe Great. The wound measures 0.9cm length x 1.9cm width x 0.1cm depth; 1.343cm^2 area and 0.134cm^3 volume. There is Fat Layer (Subcutaneous Tissue) Exposed exposed. There is no tunneling or undermining noted. There is a none present amount of drainage noted. The wound margin is flat and intact. There is no granulation within the wound bed. There is no necrotic tissue within the wound bed. The periwound skin appearance exhibited: Scarring. The periwound skin appearance did not exhibit: Callus, Crepitus, Excoriation, Induration, Rash, Dry/Scaly, Maceration, Atrophie Blanche, Cyanosis, Ecchymosis, Hemosiderin Staining, Mottled, Pallor, Rubor, Erythema. Periwound temperature was noted as No Abnormality. [name], [name] ([id number]) Assessment Active Problems ICD-10 Type 2 diabetes mellitus with foot ulcer Non-pressure chronic ulcer of other part of left foot with fat layer exposed Kidney transplant status Essential (primary) hypertension Diagnoses ICD-10 E11.621: Type 2 diabetes mellitus with foot ulcer L97.522: Non-pressure chronic ulcer of other part of left foot with fat layer exposed Z94.0: Kidney transplant status I10: Essential (primary) hypertension Plan Wound Cleansing: Wound #4 Left Toe Great: Clean wound with Normal Saline. Anesthetic (add to Medication List): Wound #4 Left Toe Great: Topical Lidocaine 4% cream applied to wound bed prior to debridement (In Clinic Only). Primary Wound Dressing: Wound #4 Left Toe Great: Silver Alginate Secondary  Dressing: Wound #4 Left Toe Great: Gauze and Kerlix/Conform Dressing Change Frequency: Wound #4 Left Toe Great: Change Dressing Monday, Wednesday, Friday Follow-up Appointments: Wound #4 Left Toe Great: Return Appointment in 1 week. Off-Loading: Wound #4 Left Toe Great: Other: - do not let toes rub in shoes. #1 where there was an open wound previously there is now a small blister. I don't think that this is infectious I think this is simply footwear friction trauma. I've asked her to put silver alginate on this with gauze [place] and keep this off loaded in her footwear. #2 I will look at this next week. I'm hopeful that the fluid will be resorbed and that the skin over this area will remain viable #3 I do not think that evacuating the blister was currently necessary although I'd like to look at this next week [name], [name] ([id number]) Electronic Signature(s) Signed: 01/25/2018 5:24:02 PM By: Linton Ham MD Entered By: Linton Ham on 01/25/2018 16:54:49 [name] ([id number]) -------------------------------------------------------------------------------- SuperBill Details Patient Name: [name],  [name] Date of Service: 01/25/2018 Medical Record Number: [id number] Patient Account Number: [id number] Date of Birth/Sex: 1986/02/15 (32 y.o. F) Treating RN: Cornell Barman Primary Care Provider: [name] Other Clinician: Referring Provider: [name] Treating Provider/Extender: Tito Dine in Treatment: 7 Diagnosis Coding ICD-10 Codes Code Description E11.621 Type 2 diabetes mellitus with foot ulcer L97.522 Non-pressure chronic ulcer of other part of left foot with fat layer exposed Z94.0 Kidney transplant status I10 Essential (primary) hypertension Facility Procedures CPT4 Code: [id number] Description: [phone] - WOUND CARE VISIT-LEV 2 EST PT Modifier: Quantity: 1 Physician Procedures CPT4 Code: [id number] Description: 62229 - WC PHYS LEVEL 2 - EST PT  ICD-10 Diagnosis Description L97.522 Non-pressure chronic ulcer of other part of left foot with fat E11.621 Type 2 diabetes mellitus with foot ulcer Modifier: layer exposed Quantity: 1 Electronic Signature(s) Signed: 01/25/2018 5:24:02 PM By: Linton Ham MD Entered By: Linton Ham on 01/25/2018 16:55:10

## 2018-01-27 NOTE — Progress Notes (Signed)
MARIEELENA, BARTKO ([id number]) Visit Report for 01/25/2018 Arrival Information Details Patient Name: [name], [name] Date of Service: 01/25/2018 2:15 PM Medical Record Number: [id number] Patient Account Number: [id number] Date of Birth/Sex: 28-Apr-1985 (32 y.o. F) Treating RN: Cornell Barman Primary Care Aggie Douse: [name] Other Clinician: Referring Shealynn Saulnier: [name] Treating Prestyn Mahn/Extender: Tito Dine in Treatment: 7 Visit Information History Since Last Visit Added or deleted any medications: Yes Patient Arrived: Ambulatory Any new allergies or adverse reactions: No Arrival Time: 14:44 Had a fall or experienced change in No Accompanied By: self activities of daily living that may affect Transfer Assistance: None risk of falls: Patient Identification Verified: Yes Signs or symptoms of abuse/neglect since last visito No Secondary Verification Process Yes Hospitalized since last visit: No Completed: Implantable device outside of the clinic excluding No Patient Has Alerts: Yes cellular tissue based products placed in the center Patient Alerts: ABI 02/14/17 L 1.07 R since last visit: 1.11 Pain Present Now: No Electronic Signature(s) Signed: 01/25/2018 5:34:01 PM By: Gretta Cool, BSN, RN, CWS, Kim RN, BSN Entered By: Gretta Cool, BSN, RN, CWS, Kim on 01/25/2018 14:46:54 [name] ([id number]) -------------------------------------------------------------------------------- Clinic Level of Care Assessment Details Patient Name: [name] Date of Service: 01/25/2018 2:15 PM Medical Record Number: [id number] Patient Account Number: [id number] Date of Birth/Sex: 1985-11-07 (32 y.o. F) Treating RN: Cornell Barman Primary Care Stephene Alegria: [name] Other Clinician: Referring Delmo Matty: [name] Treating Siah Kannan/Extender: Tito Dine in Treatment: 7 Clinic Level of Care Assessment Items TOOL 4 Quantity Score []  - Use when only an EandM is performed  on FOLLOW-UP visit 0 ASSESSMENTS - Nursing Assessment / Reassessment []  - Reassessment of Co-morbidities (includes updates in patient status) 0 X- 1 5 Reassessment of Adherence to Treatment Plan ASSESSMENTS - Wound and Skin Assessment / Reassessment X - Simple Wound Assessment / Reassessment - one wound 1 5 []  - 0 Complex Wound Assessment / Reassessment - multiple wounds []  - 0 Dermatologic / Skin Assessment (not related to wound area) ASSESSMENTS - Focused Assessment []  - Circumferential Edema Measurements - multi extremities 0 []  - 0 Nutritional Assessment / Counseling / Intervention []  - 0 Lower Extremity Assessment (monofilament, tuning fork, pulses) []  - 0 Peripheral Arterial Disease Assessment (using hand held doppler) ASSESSMENTS - Ostomy and/or Continence Assessment and Care []  - Incontinence Assessment and Management 0 []  - 0 Ostomy Care Assessment and Management (repouching, etc.) PROCESS - Coordination of Care X - Simple Patient / Family Education for ongoing care 1 15 []  - 0 Complex (extensive) Patient / Family Education for ongoing care []  - 0 Staff obtains Programmer, systems, Records, Test Results / Process Orders []  - 0 Staff telephones HHA, Nursing Homes / Clarify orders / etc []  - 0 Routine Transfer to another Facility (non-emergent condition) []  - 0 Routine Hospital Admission (non-emergent condition) []  - 0 New Admissions / Biomedical engineer / Ordering NPWT, Apligraf, etc. []  - 0 Emergency Hospital Admission (emergent condition) X- 1 10 Simple Discharge Coordination [name], [name] ([id number]) []  - 0 Complex (extensive) Discharge Coordination PROCESS - Special Needs []  - Pediatric / Minor Patient Management 0 []  - 0 Isolation Patient Management []  - 0 Hearing / Language / Visual special needs []  - 0 Assessment of Community assistance (transportation, D/C planning, etc.) []  - 0 Additional assistance / Altered mentation []  - 0 Support Surface(s)  Assessment (bed, cushion, seat, etc.) INTERVENTIONS - Wound Cleansing / Measurement X - Simple Wound Cleansing - one wound 1 5 []  -  0 Complex Wound Cleansing - multiple wounds X- 1 5 Wound Imaging (photographs - any number of wounds) []  - 0 Wound Tracing (instead of photographs) X- 1 5 Simple Wound Measurement - one wound []  - 0 Complex Wound Measurement - multiple wounds INTERVENTIONS - Wound Dressings X - Small Wound Dressing one or multiple wounds 1 10 []  - 0 Medium Wound Dressing one or multiple wounds []  - 0 Large Wound Dressing one or multiple wounds []  - 0 Application of Medications - topical []  - 0 Application of Medications - injection INTERVENTIONS - Miscellaneous []  - External ear exam 0 []  - 0 Specimen Collection (cultures, biopsies, blood, body fluids, etc.) []  - 0 Specimen(s) / Culture(s) sent or taken to Lab for analysis []  - 0 Patient Transfer (multiple staff / Civil Service fast streamer / Similar devices) []  - 0 Simple Staple / Suture removal (25 or less) []  - 0 Complex Staple / Suture removal (26 or more) []  - 0 Hypo / Hyperglycemic Management (close monitor of Blood Glucose) []  - 0 Ankle / Brachial Index (ABI) - do not check if billed separately X- 1 5 Vital Signs [name], [name] ([id number]) Has the patient been seen at the hospital within the last three years: Yes Total Score: 65 Level Of Care: New/Established - Level 2 Electronic Signature(s) Signed: 01/25/2018 5:34:01 PM By: Gretta Cool, BSN, RN, CWS, Kim RN, BSN Entered By: Gretta Cool, BSN, RN, CWS, Kim on 01/25/2018 15:17:14 [name] ([id number]) -------------------------------------------------------------------------------- Lower Extremity Assessment Details Patient Name: [name] Date of Service: 01/25/2018 2:15 PM Medical Record Number: [id number] Patient Account Number: [id number] Date of Birth/Sex: [birth year] (32 y.o. F) Treating RN: Secundino Ginger Primary Care Yaqub Arney: [name] Other  Clinician: Referring Kadence Mimbs: [name] Treating Canuto Kingston/Extender: Tito Dine in Treatment: 7 Edema Assessment Assessed: [Left: No] [Right: No] [Left: Edema] [Right: :] Calf Left: Right: Point of Measurement: 35 cm From Medial Instep 39 cm cm Ankle Left: Right: Point of Measurement: 11 cm From Medial Instep 23 cm cm Vascular Assessment Claudication: Claudication Assessment [Left:None] Pulses: Dorsalis Pedis Palpable: [Left:Yes] Posterior Tibial Extremity colors, hair growth, and conditions: Extremity Color: [Left:Normal] Hair Growth on Extremity: [Left:No] Temperature of Extremity: [Left:Warm] Capillary Refill: [Left:< 3 seconds] Toe Nail Assessment Left: Right: Thick: No Discolored: No Deformed: No Improper Length and Hygiene: No Electronic Signature(s) Signed: 01/25/2018 4:11:31 PM By: Secundino Ginger Entered By: Secundino Ginger on 01/25/2018 14:52:30 [name] ([id number]) -------------------------------------------------------------------------------- Multi Wound Chart Details Patient Name: [name] Date of Service: 01/25/2018 2:15 PM Medical Record Number: [id number] Patient Account Number: [id number] Date of Birth/Sex: 1985/08/03 (32 y.o. 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] [N/A:N/A] Comorbid History: [4:Anemia, Hypertension, Type II Diabetes, Osteoarthritis, Neuropathy, Seizure Disorder] [N/A:N/A] Date Acquired: [4:11/07/2017]  [N/A:N/A] Weeks of Treatment: [4:7] [N/A:N/A] Wound Status: [4:Open] [N/A:N/A] Pending Amputation on [4:Yes] [N/A:N/A] Presentation: Measurements L x W x D [4:0.9x1.9x0.1] [N/A:N/A] (cm) Area (cm) : [4:1.343] [N/A:N/A] Volume (cm) : [4:0.134] [N/A:N/A] % Reduction in Area: [4:50.40%] [N/A:N/A] % Reduction in Volume: [4:83.50%] [N/A:N/A] Classification: [4:Grade 1] [N/A:N/A] Exudate Amount: [4:None Present] [N/A:N/A] Wound Margin: [4:Flat and Intact] [N/A:N/A] Granulation Amount: [4:None Present (  0%)] [N/A:N/A] Necrotic Amount: [4:None Present (0%)] [N/A:N/A] Exposed Structures: [4:Fat Layer (Subcutaneous Tissue) Exposed: Yes Fascia: No Tendon: No Muscle: No Joint: No Bone: No] [N/A:N/A] Epithelialization: [4:None] [N/A:N/A] Periwound Skin Texture: [4:Scarring: Yes Excoriation: No Induration: No Callus: No] [N/A:N/A] Crepitus: No Rash: No Periwound Skin Moisture: Maceration: No N/A N/A Dry/Scaly: No Periwound Skin Color: Atrophie Blanche: No N/A N/A Cyanosis: No Ecchymosis: No Erythema: No Hemosiderin Staining: No Mottled: No Pallor: No Rubor: No Temperature: No Abnormality N/A N/A Tenderness on Palpation: No N/A N/A Wound Preparation: Ulcer Cleansing: N/A N/A Rinsed/Irrigated with Saline Topical Anesthetic Applied: Other: lidocaine 4% Treatment Notes Electronic Signature(s) Signed: 01/25/2018 5:24:02 PM By: Linton Ham MD Entered By: Linton Ham on 01/25/2018 16:46:49 [name] ([id number]) -------------------------------------------------------------------------------- [place] Details Patient Name: [name] Date of Service: 01/25/2018 2:15 PM Medical Record Number: [id number] Patient Account Number: [id number] Date of Birth/Sex: Mar 05, 1986 (32 y.o. F) Treating RN: Cornell Barman Primary Care Cyril Railey: [name] Other Clinician: Referring Marytza Grandpre: [name] Treating Ronald Vinsant/Extender: Tito Dine in  Treatment: 7 Active Inactive ` Necrotic Tissue Nursing Diagnoses: Impaired tissue integrity related to necrotic/devitalized tissue Goals: Necrotic/devitalized tissue will be minimized in the wound bed Date Initiated: 12/01/2017 Target Resolution Date: 12/16/2017 Goal Status: Active Interventions: Assess patient pain level pre-, during and post procedure and prior to discharge Treatment Activities: Apply topical anesthetic as ordered : 12/01/2017 Notes: ` Orientation to the Wound Care Program Nursing Diagnoses: Knowledge deficit related to the wound healing center program Goals: Patient/caregiver will verbalize understanding of the [place] Date Initiated: 12/01/2017 Target Resolution Date: 12/31/2017 Goal Status: Active Interventions: Provide education on orientation to the wound center Notes: ` Wound/Skin Impairment Nursing Diagnoses: Impaired tissue integrity Goals: Patient/caregiver will verbalize understanding of skin care regimen Date Initiated: 12/01/2017 Target Resolution Date: 12/31/2017 [name], [name] ([id number]) Goal Status: Active Interventions: Assess ulceration(s) every visit Notes: Electronic Signature(s) Signed: 01/25/2018 5:34:01 PM By: Gretta Cool, BSN, RN, CWS, Kim RN, BSN Entered By: Gretta Cool, BSN, RN, CWS, Kim on 01/25/2018 15:13:04 [name] ([id number]) -------------------------------------------------------------------------------- Pain Assessment Details Patient Name: [name] Date of Service: 01/25/2018 2:15 PM Medical Record Number: [id number] Patient Account Number: [id number] Date of Birth/Sex: 11/29/1985 (32 y.o. F) Treating RN: Cornell Barman Primary Care Wymon Swaney: [name] Other Clinician: Referring Fiorela Pelzer: [name] Treating Magenta Schmiesing/Extender: Tito Dine in Treatment: 7 Active Problems Location of Pain Severity and Description of Pain Patient Has Paino No Site Locations Pain Management  and Medication Current Pain Management: Electronic Signature(s) Signed: 01/25/2018 5:34:01 PM By: Gretta Cool, BSN, RN, CWS, Kim RN, BSN Entered By: Gretta Cool, BSN, RN, CWS, Kim on 01/25/2018 14:47:03 [name] ([id number]) -------------------------------------------------------------------------------- Patient/Caregiver Education Details Patient Name: [name] Date of Service: 01/25/2018 2:15 PM Medical Record Number: [id number] Patient Account Number: [id number] Date of Birth/Gender: 1985-06-06 (32 y.o. F) Treating RN: Cornell Barman Primary Care Physician: [name] Other Clinician: Referring Physician: [name] Treating Physician/Extender: Tito Dine in Treatment: 7 Education Assessment Education Provided To: Patient Education Topics Provided Wound/Skin Impairment: Handouts: Caring for Your Ulcer Methods: Demonstration, Explain/Verbal Responses: State content correctly Electronic Signature(s) Signed: 01/25/2018 5:34:01 PM By: Gretta Cool, BSN, RN, CWS, Kim RN, BSN Entered By: Gretta Cool, BSN, RN, CWS, Kim on 01/25/2018 15:20:41 [name] ([id number]) -------------------------------------------------------------------------------- Wound Assessment Details Patient Name: [name] Date of Service: 01/25/2018 2:15 PM Medical Record Number: [id number] Patient Account Number: [id number] Date of Birth/Sex: [birth year] (32 y.o. F) Treating RN: Secundino Ginger  Primary Care Madine Sarr: [name] Other Clinician: Referring Colter Magowan: [name] Treating Chatham Howington/Extender: Ricard Dillon Weeks in Treatment: 7 Wound Status Wound Number: 4 Primary Diabetic Wound/Ulcer of the Lower Extremity Etiology: Wound Location: Left Toe Great Wound Open Wounding Event: Gradually Appeared Status: Date Acquired: 11/07/2017 Comorbid Anemia, Hypertension, Type II Diabetes, Weeks Of Treatment: 7 History: Osteoarthritis, Neuropathy, Seizure Disorder Clustered Wound:  No Pending Amputation On Presentation Wound Measurements Length: (cm) 0.9 Width: (cm) 1.9 Depth: (cm) 0.1 Area: (cm) 1.343 Volume: (cm) 0.134 % Reduction in Area: 50.4% % Reduction in Volume: 83.5% Epithelialization: None Tunneling: No Undermining: No Wound Description Classification: Grade 1 Wound Margin: Flat and Intact Exudate Amount: None Present Foul Odor After Cleansing: No Slough/Fibrino Yes Wound Bed Granulation Amount: None Present (0%) Exposed Structure Necrotic Amount: None Present (0%) Fascia Exposed: No Fat Layer (Subcutaneous Tissue) Exposed: Yes Tendon Exposed: No Muscle Exposed: No Joint Exposed: No Bone Exposed: No Periwound Skin Texture Texture Color No Abnormalities Noted: No No Abnormalities Noted: No Callus: No Atrophie Blanche: No Crepitus: No Cyanosis: No Excoriation: No Ecchymosis: No Induration: No Erythema: No Rash: No Hemosiderin Staining: No Scarring: Yes Mottled: No Pallor: No Moisture Rubor: No No Abnormalities Noted: No Dry / Scaly: No Temperature / Pain Maceration: No Temperature: No Abnormality Wound Preparation [name], [name] ([id number]) Ulcer Cleansing: Rinsed/Irrigated with Saline Topical Anesthetic Applied: Other: lidocaine 4%, Electronic Signature(s) Signed: 01/25/2018 4:11:31 PM By: Secundino Ginger Entered By: Secundino Ginger on 01/25/2018 14:51:16 [name] ([id number]) -------------------------------------------------------------------------------- Vitals Details Patient Name: [name] Date of Service: 01/25/2018 2:15 PM Medical Record Number: [id number] Patient Account Number: [id number] Date of Birth/Sex: [birth year] (32 y.o. F) Treating RN: Cornell Barman Primary Care Islam Villescas: [name] Other Clinician: Referring Keondra Haydu: [name] Treating Karol Skarzynski/Extender: Tito Dine in Treatment: 7 Vital Signs Time Taken: 14:45 Temperature (F): 98.1 Height (in): 66 Pulse (bpm):  95 Weight (lbs): 174 Respiratory Rate (breaths/min): 16 Body Mass Index (BMI): 28.1 Blood Pressure (mmHg): 168/103 Reference Range: 80 - 120 mg / dl Electronic Signature(s) Signed: 01/25/2018 5:34:01 PM By: Gretta Cool, BSN, RN, CWS, Kim RN, BSN Entered By: Gretta Cool, BSN, RN, CWS, Kim on 01/25/2018 14:49:38

## 2018-01-31 DIAGNOSIS — E1365 Other specified diabetes mellitus with hyperglycemia: Secondary | ICD-10-CM | POA: Diagnosis not present

## 2018-01-31 DIAGNOSIS — E1065 Type 1 diabetes mellitus with hyperglycemia: Secondary | ICD-10-CM | POA: Diagnosis not present

## 2018-02-01 ENCOUNTER — Encounter: Payer: Medicare Other | Admitting: Internal Medicine

## 2018-02-01 DIAGNOSIS — Z940 Kidney transplant status: Secondary | ICD-10-CM | POA: Diagnosis not present

## 2018-02-01 DIAGNOSIS — M1990 Unspecified osteoarthritis, unspecified site: Secondary | ICD-10-CM | POA: Diagnosis not present

## 2018-02-01 DIAGNOSIS — E11621 Type 2 diabetes mellitus with foot ulcer: Secondary | ICD-10-CM | POA: Diagnosis not present

## 2018-02-01 DIAGNOSIS — L97522 Non-pressure chronic ulcer of other part of left foot with fat layer exposed: Secondary | ICD-10-CM | POA: Diagnosis not present

## 2018-02-01 DIAGNOSIS — I10 Essential (primary) hypertension: Secondary | ICD-10-CM | POA: Diagnosis not present

## 2018-02-01 DIAGNOSIS — S90422A Blister (nonthermal), left great toe, initial encounter: Secondary | ICD-10-CM | POA: Diagnosis not present

## 2018-02-01 DIAGNOSIS — D649 Anemia, unspecified: Secondary | ICD-10-CM | POA: Diagnosis not present

## 2018-02-02 ENCOUNTER — Inpatient Hospital Stay (HOSPITAL_COMMUNITY)
Admission: EM | Admit: 2018-02-02 | Discharge: 2018-02-06 | DRG: 690 | Disposition: A | Discharge status: Home / Self Care | Payer: Medicare Other | Attending: Internal Medicine | Admitting: Internal Medicine

## 2018-02-02 ENCOUNTER — Observation Stay (HOSPITAL_COMMUNITY): Payer: Medicare Other

## 2018-02-02 ENCOUNTER — Encounter (HOSPITAL_COMMUNITY): Payer: Self-pay

## 2018-02-02 ENCOUNTER — Other Ambulatory Visit: Payer: Self-pay

## 2018-02-02 DIAGNOSIS — Z91018 Allergy to other foods: Secondary | ICD-10-CM

## 2018-02-02 DIAGNOSIS — Z9089 Acquired absence of other organs: Secondary | ICD-10-CM

## 2018-02-02 DIAGNOSIS — R110 Nausea: Secondary | ICD-10-CM | POA: Diagnosis not present

## 2018-02-02 DIAGNOSIS — D631 Anemia in chronic kidney disease: Secondary | ICD-10-CM | POA: Diagnosis not present

## 2018-02-02 DIAGNOSIS — Z833 Family history of diabetes mellitus: Secondary | ICD-10-CM

## 2018-02-02 DIAGNOSIS — N179 Acute kidney failure, unspecified: Secondary | ICD-10-CM | POA: Diagnosis present

## 2018-02-02 DIAGNOSIS — N184 Chronic kidney disease, stage 4 (severe): Secondary | ICD-10-CM

## 2018-02-02 DIAGNOSIS — E1065 Type 1 diabetes mellitus with hyperglycemia: Secondary | ICD-10-CM | POA: Diagnosis present

## 2018-02-02 DIAGNOSIS — E876 Hypokalemia: Secondary | ICD-10-CM | POA: Diagnosis present

## 2018-02-02 DIAGNOSIS — F819 Developmental disorder of scholastic skills, unspecified: Secondary | ICD-10-CM | POA: Diagnosis present

## 2018-02-02 DIAGNOSIS — Y830 Surgical operation with transplant of whole organ as the cause of abnormal reaction of the patient, or of later complication, without mention of misadventure at the time of the procedure: Secondary | ICD-10-CM | POA: Diagnosis present

## 2018-02-02 DIAGNOSIS — N2581 Secondary hyperparathyroidism of renal origin: Secondary | ICD-10-CM | POA: Diagnosis not present

## 2018-02-02 DIAGNOSIS — I129 Hypertensive chronic kidney disease with stage 1 through stage 4 chronic kidney disease, or unspecified chronic kidney disease: Secondary | ICD-10-CM | POA: Diagnosis not present

## 2018-02-02 DIAGNOSIS — Z794 Long term (current) use of insulin: Secondary | ICD-10-CM

## 2018-02-02 DIAGNOSIS — Z8261 Family history of arthritis: Secondary | ICD-10-CM

## 2018-02-02 DIAGNOSIS — D899 Disorder involving the immune mechanism, unspecified: Secondary | ICD-10-CM

## 2018-02-02 DIAGNOSIS — N183 Chronic kidney disease, stage 3 (moderate): Secondary | ICD-10-CM

## 2018-02-02 DIAGNOSIS — Z8249 Family history of ischemic heart disease and other diseases of the circulatory system: Secondary | ICD-10-CM

## 2018-02-02 DIAGNOSIS — R609 Edema, unspecified: Secondary | ICD-10-CM | POA: Diagnosis not present

## 2018-02-02 DIAGNOSIS — R112 Nausea with vomiting, unspecified: Secondary | ICD-10-CM | POA: Diagnosis present

## 2018-02-02 DIAGNOSIS — G8929 Other chronic pain: Secondary | ICD-10-CM | POA: Diagnosis present

## 2018-02-02 DIAGNOSIS — E785 Hyperlipidemia, unspecified: Secondary | ICD-10-CM | POA: Diagnosis present

## 2018-02-02 DIAGNOSIS — N189 Chronic kidney disease, unspecified: Secondary | ICD-10-CM | POA: Diagnosis present

## 2018-02-02 DIAGNOSIS — I120 Hypertensive chronic kidney disease with stage 5 chronic kidney disease or end stage renal disease: Secondary | ICD-10-CM | POA: Diagnosis not present

## 2018-02-02 DIAGNOSIS — K3184 Gastroparesis: Secondary | ICD-10-CM | POA: Diagnosis present

## 2018-02-02 DIAGNOSIS — E1022 Type 1 diabetes mellitus with diabetic chronic kidney disease: Secondary | ICD-10-CM | POA: Diagnosis present

## 2018-02-02 DIAGNOSIS — D849 Immunodeficiency, unspecified: Secondary | ICD-10-CM | POA: Diagnosis present

## 2018-02-02 DIAGNOSIS — E11621 Type 2 diabetes mellitus with foot ulcer: Secondary | ICD-10-CM | POA: Diagnosis not present

## 2018-02-02 DIAGNOSIS — E872 Acidosis, unspecified: Secondary | ICD-10-CM | POA: Diagnosis present

## 2018-02-02 DIAGNOSIS — Z940 Kidney transplant status: Secondary | ICD-10-CM

## 2018-02-02 DIAGNOSIS — R159 Full incontinence of feces: Secondary | ICD-10-CM | POA: Diagnosis present

## 2018-02-02 DIAGNOSIS — R1084 Generalized abdominal pain: Secondary | ICD-10-CM | POA: Diagnosis not present

## 2018-02-02 DIAGNOSIS — T8619 Other complication of kidney transplant: Secondary | ICD-10-CM | POA: Diagnosis present

## 2018-02-02 DIAGNOSIS — Z886 Allergy status to analgesic agent status: Secondary | ICD-10-CM

## 2018-02-02 DIAGNOSIS — N10 Acute tubulo-interstitial nephritis: Principal | ICD-10-CM | POA: Diagnosis present

## 2018-02-02 DIAGNOSIS — E108 Type 1 diabetes mellitus with unspecified complications: Secondary | ICD-10-CM

## 2018-02-02 DIAGNOSIS — I10 Essential (primary) hypertension: Secondary | ICD-10-CM | POA: Diagnosis not present

## 2018-02-02 DIAGNOSIS — N059 Unspecified nephritic syndrome with unspecified morphologic changes: Secondary | ICD-10-CM | POA: Diagnosis not present

## 2018-02-02 DIAGNOSIS — Z90710 Acquired absence of both cervix and uterus: Secondary | ICD-10-CM

## 2018-02-02 DIAGNOSIS — E139 Other specified diabetes mellitus without complications: Secondary | ICD-10-CM | POA: Diagnosis not present

## 2018-02-02 DIAGNOSIS — L97529 Non-pressure chronic ulcer of other part of left foot with unspecified severity: Secondary | ICD-10-CM | POA: Diagnosis not present

## 2018-02-02 DIAGNOSIS — E1043 Type 1 diabetes mellitus with diabetic autonomic (poly)neuropathy: Secondary | ICD-10-CM | POA: Diagnosis present

## 2018-02-02 DIAGNOSIS — T8691 Unspecified transplanted organ and tissue rejection: Secondary | ICD-10-CM | POA: Diagnosis not present

## 2018-02-02 DIAGNOSIS — R52 Pain, unspecified: Secondary | ICD-10-CM | POA: Diagnosis not present

## 2018-02-02 DIAGNOSIS — Q970 Karyotype 47, XXX: Secondary | ICD-10-CM

## 2018-02-02 DIAGNOSIS — E1143 Type 2 diabetes mellitus with diabetic autonomic (poly)neuropathy: Secondary | ICD-10-CM | POA: Diagnosis present

## 2018-02-02 DIAGNOSIS — E109 Type 1 diabetes mellitus without complications: Secondary | ICD-10-CM | POA: Diagnosis present

## 2018-02-02 DIAGNOSIS — IMO0002 Reserved for concepts with insufficient information to code with codable children: Secondary | ICD-10-CM | POA: Diagnosis present

## 2018-02-02 DIAGNOSIS — N186 End stage renal disease: Secondary | ICD-10-CM | POA: Diagnosis present

## 2018-02-02 DIAGNOSIS — Z9049 Acquired absence of other specified parts of digestive tract: Secondary | ICD-10-CM

## 2018-02-02 DIAGNOSIS — Z91013 Allergy to seafood: Secondary | ICD-10-CM

## 2018-02-02 DIAGNOSIS — Q929 Trisomy and partial trisomy of autosomes, unspecified: Secondary | ICD-10-CM

## 2018-02-02 DIAGNOSIS — R109 Unspecified abdominal pain: Secondary | ICD-10-CM | POA: Diagnosis not present

## 2018-02-02 DIAGNOSIS — Z881 Allergy status to other antibiotic agents status: Secondary | ICD-10-CM

## 2018-02-02 DIAGNOSIS — Z888 Allergy status to other drugs, medicaments and biological substances status: Secondary | ICD-10-CM

## 2018-02-02 LAB — CBC WITH DIFFERENTIAL/PLATELET
Abs Immature Granulocytes: 0.23 10*3/uL — ABNORMAL HIGH (ref 0.00–0.07)
Basophils Absolute: 0 10*3/uL (ref 0.0–0.1)
Basophils Relative: 0 %
Eosinophils Absolute: 0.2 10*3/uL (ref 0.0–0.5)
Eosinophils Relative: 1 %
HCT: 32.3 % — ABNORMAL LOW (ref 36.0–46.0)
Hemoglobin: 10.1 g/dL — ABNORMAL LOW (ref 12.0–15.0)
Immature Granulocytes: 2 %
Lymphocytes Relative: 22 %
Lymphs Abs: 2.6 10*3/uL (ref 0.7–4.0)
MCH: 28.4 pg (ref 26.0–34.0)
MCHC: 31.3 g/dL (ref 30.0–36.0)
MCV: 90.7 fL (ref 80.0–100.0)
Monocytes Absolute: 1 10*3/uL (ref 0.1–1.0)
Monocytes Relative: 8 %
Neutro Abs: 8 10*3/uL — ABNORMAL HIGH (ref 1.7–7.7)
Neutrophils Relative %: 67 %
Platelets: 295 10*3/uL (ref 150–400)
RBC: 3.56 MIL/uL — ABNORMAL LOW (ref 3.87–5.11)
RDW: 15.3 % (ref 11.5–15.5)
WBC: 12 10*3/uL — ABNORMAL HIGH (ref 4.0–10.5)
nRBC: 0 % (ref 0.0–0.2)

## 2018-02-02 LAB — I-STAT BETA HCG BLOOD, ED (MC, WL, AP ONLY): I-stat hCG, quantitative: <5 m[IU]/mL (ref <5)

## 2018-02-02 LAB — COMPREHENSIVE METABOLIC PANEL
ALT: 21 U/L (ref 0–44)
AST: 16 U/L (ref 15–41)
Albumin: 2.8 g/dL — ABNORMAL LOW (ref 3.5–5.0)
Alkaline Phosphatase: 52 U/L (ref 38–126)
Anion gap: 10 (ref 5–15)
BUN: 81 mg/dL — ABNORMAL HIGH (ref 6–20)
CO2: 17 mmol/L — ABNORMAL LOW (ref 22–32)
Calcium: 5.4 mg/dL — CL (ref 8.9–10.3)
Chloride: 114 mmol/L — ABNORMAL HIGH (ref 98–111)
Creatinine, Ser: 3.96 mg/dL — ABNORMAL HIGH (ref 0.44–1.00)
GFR calc Af Amer: 16 mL/min — ABNORMAL LOW (ref >60)
GFR calc non Af Amer: 14 mL/min — ABNORMAL LOW (ref >60)
Glucose, Bld: 185 mg/dL — ABNORMAL HIGH (ref 70–99)
Potassium: 2.9 mmol/L — ABNORMAL LOW (ref 3.5–5.1)
Sodium: 141 mmol/L (ref 135–145)
Total Bilirubin: 0.7 mg/dL (ref 0.3–1.2)
Total Protein: 6.5 g/dL (ref 6.5–8.1)

## 2018-02-02 LAB — URINALYSIS, ROUTINE W REFLEX MICROSCOPIC
Bilirubin Urine: NEGATIVE
Glucose, UA: 150 mg/dL — AB
Hgb urine dipstick: NEGATIVE
Ketones, ur: NEGATIVE mg/dL
Leukocytes, UA: NEGATIVE
Nitrite: NEGATIVE
Protein, ur: >=300 mg/dL — AB
Specific Gravity, Urine: 1.011 (ref 1.005–1.030)
pH: 7 (ref 5.0–8.0)

## 2018-02-02 LAB — PHOSPHORUS: Phosphorus: 5.5 mg/dL — ABNORMAL HIGH (ref 2.5–4.6)

## 2018-02-02 LAB — GLUCOSE, CAPILLARY: Glucose-Capillary: 110 mg/dL — ABNORMAL HIGH (ref 70–99)

## 2018-02-02 LAB — LIPASE, BLOOD: Lipase: 76 U/L — ABNORMAL HIGH (ref 11–51)

## 2018-02-02 LAB — CBG MONITORING, ED: Glucose-Capillary: 149 mg/dL — ABNORMAL HIGH (ref 70–99)

## 2018-02-02 LAB — MAGNESIUM: Magnesium: 1.2 mg/dL — ABNORMAL LOW (ref 1.7–2.4)

## 2018-02-02 MED ORDER — SODIUM CHLORIDE 0.9 % IV BOLUS
1000.0000 mL | Freq: Once | INTRAVENOUS | Status: AC
  Administered 2018-02-02: 1000 mL via INTRAVENOUS

## 2018-02-02 MED ORDER — CALCITRIOL 0.25 MCG PO CAPS
0.2500 ug | ORAL_CAPSULE | Freq: Every day | ORAL | Status: DC
  Filled 2018-02-02: qty 1

## 2018-02-02 MED ORDER — MAGNESIUM SULFATE 2 GM/50ML IV SOLN
2.0000 g | Freq: Once | INTRAVENOUS | Status: AC
  Administered 2018-02-02: 2 g via INTRAVENOUS
  Filled 2018-02-02: qty 50

## 2018-02-02 MED ORDER — POTASSIUM CHLORIDE 10 MEQ/100ML IV SOLN: 10.0000 meq | INTRAVENOUS | Status: DC

## 2018-02-02 MED ORDER — FLUTICASONE PROPIONATE 50 MCG/ACT NA SUSP
2.0000 | Freq: Every day | NASAL | Status: DC
  Administered 2018-02-04 – 2018-02-05 (×2): 2 via NASAL
  Filled 2018-02-02: qty 16

## 2018-02-02 MED ORDER — POLYETHYLENE GLYCOL 3350 17 G PO PACK: 17.0000 g | PACK | Freq: Every day | ORAL | Status: DC | PRN

## 2018-02-02 MED ORDER — CARVEDILOL 12.5 MG PO TABS
12.5000 mg | ORAL_TABLET | Freq: Two times a day (BID) | ORAL | Status: DC
  Administered 2018-02-02 – 2018-02-06 (×8): 12.5 mg via ORAL
  Filled 2018-02-02 (×9): qty 1

## 2018-02-02 MED ORDER — ACETAMINOPHEN 650 MG RE SUPP: 650.0000 mg | Freq: Four times a day (QID) | RECTAL | Status: DC | PRN

## 2018-02-02 MED ORDER — CALCIUM GLUCONATE-NACL 1-0.675 GM/50ML-% IV SOLN
1.0000 g | Freq: Once | INTRAVENOUS | Status: AC
  Administered 2018-02-02: 1000 mg via INTRAVENOUS
  Filled 2018-02-02: qty 50

## 2018-02-02 MED ORDER — SODIUM BICARBONATE 650 MG PO TABS
1300.0000 mg | ORAL_TABLET | Freq: Two times a day (BID) | ORAL | Status: DC
  Administered 2018-02-02 – 2018-02-03 (×2): 1300 mg via ORAL
  Filled 2018-02-02 (×2): qty 2

## 2018-02-02 MED ORDER — POTASSIUM CHLORIDE 10 MEQ/100ML IV SOLN
10.0000 meq | INTRAVENOUS | Status: AC
  Administered 2018-02-02 (×2): 10 meq via INTRAVENOUS
  Filled 2018-02-02 (×2): qty 100

## 2018-02-02 MED ORDER — ACETAMINOPHEN 325 MG PO TABS
650.0000 mg | ORAL_TABLET | Freq: Four times a day (QID) | ORAL | Status: DC | PRN
  Administered 2018-02-02: 650 mg via ORAL
  Filled 2018-02-02 (×2): qty 2

## 2018-02-02 MED ORDER — MYCOPHENOLATE SODIUM 180 MG PO TBEC: 720.0000 mg | DELAYED_RELEASE_TABLET | Freq: Two times a day (BID) | ORAL | Status: DC

## 2018-02-02 MED ORDER — CALCIUM GLUCONATE-NACL 1-0.675 GM/50ML-% IV SOLN: 1.0000 g | Freq: Once | INTRAVENOUS | Status: DC

## 2018-02-02 MED ORDER — CALCITRIOL 0.25 MCG PO CAPS
0.2500 ug | ORAL_CAPSULE | Freq: Every day | ORAL | Status: DC
  Administered 2018-02-02 – 2018-02-03 (×2): 0.25 ug via ORAL
  Filled 2018-02-02 (×3): qty 1

## 2018-02-02 MED ORDER — PREDNISONE 20 MG PO TABS: 10.0000 mg | ORAL_TABLET | Freq: Every day | ORAL | Status: DC

## 2018-02-02 MED ORDER — GABAPENTIN 300 MG PO CAPS
300.0000 mg | ORAL_CAPSULE | Freq: Three times a day (TID) | ORAL | Status: DC
  Administered 2018-02-02: 300 mg via ORAL
  Filled 2018-02-02 (×2): qty 1

## 2018-02-02 MED ORDER — HYDRALAZINE HCL 20 MG/ML IJ SOLN: 5.0000 mg | Freq: Four times a day (QID) | INTRAMUSCULAR | Status: DC | PRN

## 2018-02-02 MED ORDER — METOCLOPRAMIDE HCL 5 MG/ML IJ SOLN
10.0000 mg | Freq: Once | INTRAMUSCULAR | Status: AC
  Administered 2018-02-02: 10 mg via INTRAVENOUS
  Filled 2018-02-02: qty 2

## 2018-02-02 MED ORDER — POTASSIUM CHLORIDE CRYS ER 20 MEQ PO TBCR: 40.0000 meq | EXTENDED_RELEASE_TABLET | Freq: Once | ORAL | Status: DC

## 2018-02-02 MED ORDER — PROMETHAZINE HCL 25 MG/ML IJ SOLN
12.5000 mg | Freq: Four times a day (QID) | INTRAMUSCULAR | Status: DC | PRN
  Administered 2018-02-02: 12.5 mg via INTRAVENOUS
  Filled 2018-02-02: qty 1

## 2018-02-02 MED ORDER — PANTOPRAZOLE SODIUM 40 MG PO TBEC: 40.0000 mg | DELAYED_RELEASE_TABLET | Freq: Every day | ORAL | Status: DC

## 2018-02-02 MED ORDER — ONDANSETRON HCL 4 MG/2ML IJ SOLN
4.0000 mg | Freq: Four times a day (QID) | INTRAMUSCULAR | Status: DC | PRN
  Administered 2018-02-04: 4 mg via INTRAVENOUS
  Filled 2018-02-02: qty 2

## 2018-02-02 MED ORDER — DICYCLOMINE HCL 10 MG/ML IM SOLN
20.0000 mg | Freq: Once | INTRAMUSCULAR | Status: AC
  Administered 2018-02-02: 20 mg via INTRAMUSCULAR
  Filled 2018-02-02: qty 2

## 2018-02-02 MED ORDER — MYCOPHENOLATE SODIUM 180 MG PO TBEC
720.0000 mg | DELAYED_RELEASE_TABLET | Freq: Two times a day (BID) | ORAL | Status: DC
  Administered 2018-02-02 – 2018-02-06 (×8): 720 mg via ORAL
  Filled 2018-02-02 (×8): qty 4

## 2018-02-02 MED ORDER — TRAMADOL HCL 50 MG PO TABS: 50.0000 mg | ORAL_TABLET | Freq: Four times a day (QID) | ORAL | Status: DC | PRN

## 2018-02-02 MED ORDER — CALCIUM ACETATE (PHOS BINDER) 667 MG PO CAPS
1334.0000 mg | ORAL_CAPSULE | Freq: Three times a day (TID) | ORAL | Status: DC
  Administered 2018-02-03 – 2018-02-04 (×2): 1334 mg via ORAL
  Filled 2018-02-02 (×4): qty 2

## 2018-02-02 MED ORDER — LORATADINE 10 MG PO TABS
10.0000 mg | ORAL_TABLET | Freq: Every day | ORAL | Status: DC
  Administered 2018-02-03 – 2018-02-05 (×3): 10 mg via ORAL
  Filled 2018-02-02 (×4): qty 1

## 2018-02-02 MED ORDER — PROMETHAZINE HCL 25 MG PO TABS: 25.0000 mg | ORAL_TABLET | Freq: Four times a day (QID) | ORAL | Status: DC | PRN

## 2018-02-02 MED ORDER — POTASSIUM CHLORIDE 10 MEQ/100ML IV SOLN
10.0000 meq | INTRAVENOUS | Status: AC
  Administered 2018-02-02 (×3): 10 meq via INTRAVENOUS
  Filled 2018-02-02 (×3): qty 100

## 2018-02-02 MED ORDER — TACROLIMUS 1 MG PO CAPS: 3.0000 mg | ORAL_CAPSULE | Freq: Two times a day (BID) | ORAL | Status: DC

## 2018-02-02 MED ORDER — CALCIUM CARBONATE ANTACID 500 MG PO CHEW
2.0000 | CHEWABLE_TABLET | Freq: Two times a day (BID) | ORAL | Status: DC
  Administered 2018-02-03: 400 mg via ORAL
  Filled 2018-02-02 (×3): qty 2

## 2018-02-02 MED ORDER — TACROLIMUS 1 MG PO CAPS
3.0000 mg | ORAL_CAPSULE | Freq: Two times a day (BID) | ORAL | Status: DC
  Administered 2018-02-02 – 2018-02-06 (×8): 3 mg via ORAL
  Filled 2018-02-02 (×8): qty 3

## 2018-02-02 MED ORDER — INSULIN ASPART 100 UNIT/ML ~~LOC~~ SOLN
0.0000 [IU] | Freq: Three times a day (TID) | SUBCUTANEOUS | Status: DC
  Administered 2018-02-03: 3 [IU] via SUBCUTANEOUS
  Administered 2018-02-03 – 2018-02-04 (×3): 2 [IU] via SUBCUTANEOUS
  Administered 2018-02-05 (×2): 3 [IU] via SUBCUTANEOUS
  Administered 2018-02-05: 2 [IU] via SUBCUTANEOUS
  Administered 2018-02-06: 3 [IU] via SUBCUTANEOUS
  Administered 2018-02-06: 5 [IU] via SUBCUTANEOUS

## 2018-02-02 MED ORDER — CALCIUM CARBONATE ANTACID 500 MG PO CHEW: 1000.0000 mg | CHEWABLE_TABLET | Freq: Three times a day (TID) | ORAL | Status: DC | PRN

## 2018-02-02 MED ORDER — PREDNISONE 10 MG PO TABS
10.0000 mg | ORAL_TABLET | Freq: Every day | ORAL | Status: DC
  Administered 2018-02-03 – 2018-02-06 (×4): 10 mg via ORAL
  Filled 2018-02-02 (×4): qty 1

## 2018-02-02 MED ORDER — INSULIN ASPART 100 UNIT/ML ~~LOC~~ SOLN
0.0000 [IU] | Freq: Every day | SUBCUTANEOUS | Status: DC
  Administered 2018-02-03 – 2018-02-05 (×3): 2 [IU] via SUBCUTANEOUS

## 2018-02-02 MED ORDER — VITAMIN D3 25 MCG (1000 UNIT) PO TABS
1000.0000 [IU] | ORAL_TABLET | Freq: Every day | ORAL | Status: DC
  Administered 2018-02-03 – 2018-02-06 (×4): 1000 [IU] via ORAL
  Filled 2018-02-02 (×8): qty 1

## 2018-02-02 MED ORDER — METOCLOPRAMIDE HCL 5 MG/ML IJ SOLN
10.0000 mg | Freq: Three times a day (TID) | INTRAMUSCULAR | Status: DC
  Administered 2018-02-03: 10 mg via INTRAVENOUS
  Filled 2018-02-02 (×2): qty 2

## 2018-02-02 MED ORDER — SACCHAROMYCES BOULARDII 250 MG PO CAPS
250.0000 mg | ORAL_CAPSULE | Freq: Every day | ORAL | Status: DC
  Administered 2018-02-03 – 2018-02-06 (×4): 250 mg via ORAL
  Filled 2018-02-02 (×4): qty 1

## 2018-02-02 MED ORDER — FAMOTIDINE 20 MG PO TABS
10.0000 mg | ORAL_TABLET | Freq: Every day | ORAL | Status: DC
  Administered 2018-02-03 – 2018-02-06 (×4): 10 mg via ORAL
  Filled 2018-02-02 (×4): qty 1

## 2018-02-02 MED ORDER — ONDANSETRON HCL 4 MG PO TABS: 4.0000 mg | ORAL_TABLET | Freq: Four times a day (QID) | ORAL | Status: DC | PRN

## 2018-02-02 MED ORDER — INSULIN GLARGINE 100 UNIT/ML ~~LOC~~ SOLN
15.0000 [IU] | Freq: Every day | SUBCUTANEOUS | Status: DC
  Administered 2018-02-02 – 2018-02-05 (×4): 15 [IU] via SUBCUTANEOUS
  Filled 2018-02-02 (×4): qty 0.15

## 2018-02-02 MED ORDER — SODIUM CHLORIDE 0.9 % IV SOLN: 1.0000 g | Freq: Once | INTRAVENOUS | Status: DC

## 2018-02-02 MED ORDER — SIMVASTATIN 20 MG PO TABS
20.0000 mg | ORAL_TABLET | ORAL | Status: DC
  Administered 2018-02-03 – 2018-02-06 (×2): 20 mg via ORAL
  Filled 2018-02-02 (×2): qty 1

## 2018-02-02 NOTE — ED Triage Notes (Signed)
Pt BIB [facility] for eval of generalized abd pain. Pt reports onset last evening w/ N/V/D. Had regularly scheduled appt this AM w/ [facility] d/t hx of renal transplant. Pt was sent here by MD at office d/t persistent generalized abd pain. Pt vomited small amt of bilious emesis on arrival. Endorses small amt of diarrhea at home.

## 2018-02-02 NOTE — ED Provider Notes (Signed)
[facility]ER[facility] DEPARTMENT Provider Note   CSN: [id number] Arrival date & time: 02/02/18  1309     History   Chief Complaint Chief Complaint  Patient presents with  . Abdominal Pain    HPI [name] is a 32 y.o. female with history of type 1 diabetes, right kidney transplant, hypertension, XXX syndrome, pseudoseizures, chronic abdominal pain, gastroparesis who presents with acute abdominal pain that began last evening after eating.  She has had associated nausea, vomiting, and diarrhea.  Her stool was not bloody.  Patient was at her nephrologist this morning who sent her here for evaluation for ongoing abdominal pain.  Patient reports her pain is worse than her normal.  She is unable to tolerate any fluids or any of her medicines.  She reports trying to take oxycodone and Zofran and vomited them.  She denies any fevers, chest pain, shortness of breath, urinary symptoms, abnormal vaginal bleeding or discharge.  HPI  Past Medical History:  Diagnosis Date  . Chronic kidney disease   . Diabetes mellitus    Pt reports diagnosis in June 2011  . Hyperlipidemia   . Hypertension   . Kidney transplant recipient   . 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 Pyelonephritis 06/23/2014  . UTI (urinary tract infection) 01/09/2015  . 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 Chronic pain 11/11/2017  . Gastroparesis   . AKI (acute kidney injury) ([place]) 07/13/2017  . Constipation 07/13/2017  . Hematemesis 07/02/2017  . Chronic cholecystitis 06/29/2017  . Type 1 diabetes mellitus with complication, uncontrolled ([place]) 05/25/2015  . Nausea & vomiting 01/09/2015  . Acute renal failure superimposed on stage 3 chronic kidney disease ([place]) 01/09/2015  . Renal transplant recipient   . Immunosuppressed status  ([place])   . 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 CHRONIC KIDNEY DISEASE STAGE II (MILD) 01/04/2010  . OVARIAN FAILURE, PREMATURE 03/11/2009  . 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 SECONDARY HYPERPARATHYROIDISM 12/05/2007  . OBESITY 09/25/2007  . Anemia due to chronic kidney disease 09/25/2007  . LEARNING DISABILITY 09/25/2007  . Essential hypertension, benign 09/25/2007  . KIDNEY TRANSPLANTATION, HX OF 09/25/2007    Past Surgical History:  Procedure Laterality Date  . ARTERIOVENOUS GRAFT PLACEMENT Bilateral    "neither work" (10/24/2017)  . CHOLECYSTECTOMY N/A 06/30/2017   Procedure: LAPAROSCOPIC CHOLECYSTECTOMY WITH INTRAOPERATIVE CHOLANGIOGRAM;  Surgeon: Excell Seltzer, MD;  Location: [facility];  Service: General;  Laterality: N/A;  . ESOPHAGOGASTRODUODENOSCOPY (EGD) WITH PROPOFOL N/A 07/04/2017   Procedure: ESOPHAGOGASTRODUODENOSCOPY (EGD) WITH PROPOFOL;  Surgeon: Clarene Essex, MD;  Location: [facility];  Service: Endoscopy;  Laterality: N/A;  . KIDNEY TRANSPLANT  2007  . PARATHYROIDECTOMY  ?2012   "3/4 removed" (10/24/2017)  . RENAL BIOPSY Bilateral 2003     OB History   None      Home Medications    Prior to Admission medications   Medication Sig Start Date End Date Taking? Authorizing Provider  acetaminophen (TYLENOL) 500 MG tablet Take 1,000 mg by mouth daily as needed for moderate pain.    Yes [provider]  calcitRIOL (ROCALTROL) 0.25 MCG capsule Take 0.25 mcg by mouth daily.   Yes [provider]  calcium acetate (PHOSLO) 667 MG capsule Take 1,334 mg by mouth 3 (three) times  daily with meals.   Yes [provider]  calcium elemental as carbonate (BARIATRIC TUMS ULTRA) 400 MG chewable tablet Chew 1,000 mg by mouth 3 (three) times daily as needed for heartburn.    Yes [provider]  carvedilol (COREG) 12.5 MG tablet Take 12.5 mg by mouth 2 (two) times daily with a meal.   Yes [provider]    cetirizine (ZYRTEC) 10 MG tablet Take 10 mg by mouth daily as needed for allergies.    Yes [provider]  Cholecalciferol (VITAMIN D3 PO) Take 1 capsule by mouth as needed (supplement).   Yes [provider]  cyclobenzaprine (FLEXERIL) 10 MG tablet Take 10 mg by mouth 2 (two) times daily as needed (pain).   Yes [provider]  famotidine (PEPCID) 10 MG tablet Take 10 mg by mouth daily.   Yes [provider]  fluticasone (FLONASE) 50 MCG/ACT nasal spray Place 2 sprays into both nostrils daily. Patient taking differently: Place 2 sprays into both nostrils daily as needed for allergies.  07/20/15  Yes Muthersbaugh, Jarrett Soho, PA-C  furosemide (LASIX) 80 MG tablet 80 mg every morning.   Yes [provider]  gabapentin (NEURONTIN) 300 MG capsule Take 300 mg by mouth 3 (three) times daily.   Yes [provider]  hydrOXYzine (ATARAX/VISTARIL) 50 MG tablet Take 50 mg by mouth 2 (two) times daily as needed for itching.    Yes [provider]  insulin aspart (NOVOLOG FLEXPEN) 100 UNIT/ML FlexPen Inject 12-15 Units into the skin See admin instructions. 5-6 times per day as needed   Yes [provider]  insulin degludec (TRESIBA) 100 UNIT/ML SOPN FlexTouch Pen Inject 0.2 mLs (20 Units total) into the skin at bedtime. Patient taking differently: Inject 60 Units into the skin daily.  10/26/17  Yes Emokpae, Courage, MD  loperamide (IMODIUM) 2 MG capsule Take 2 mg by mouth as needed for diarrhea or loose stools.    Yes [provider]  Magnesium 400 MG TABS Take 400 mg by mouth 2 (two) times daily.   Yes [provider]  mycophenolate (MYFORTIC) 360 MG TBEC Take 720 mg by mouth 2 (two) times daily.    Yes [provider]  ondansetron (ZOFRAN ODT) 4 MG disintegrating tablet Take 1 tablet (4 mg total) by mouth every 4 (four) hours as needed for nausea or vomiting. 10/26/17  Yes Emokpae, Courage, MD  predniSONE (DELTASONE)  10 MG tablet Take 3 tablets (30 mg total) by mouth daily with breakfast. Patient taking differently: Take 10 mg by mouth every morning.  11/14/17  Yes Elgergawy, Silver Huguenin, MD  simvastatin (ZOCOR) 20 MG tablet Take 20 mg by mouth at bedtime.    Yes [provider]  sodium bicarbonate 650 MG tablet Take 650 mg by mouth 3 (three) times daily.   Yes [provider]  tacrolimus (PROGRAF) 1 MG capsule Take 3 mg by mouth 2 (two) times daily.    Yes [provider]  ACCU-CHEK SOFTCLIX LANCETS lancets Use to check blood sugar 4 times per day. 12/29/15   Renato Shin, MD  clindamycin (CLEOCIN) 150 MG capsule Take 2 capsules (300 mg total) by mouth 4 (four) times daily. Patient not taking: Reported on 02/02/2018 12/15/17   Frederica Kuster, PA-C  cloNIDine (CATAPRES - DOSED IN MG/24 HR) 0.1 mg/24hr patch Place 1 patch (0.1 mg total) onto the skin once a week. Every Wednesday Patient not taking: Reported on 11/11/2017 11/02/17   Roxan Hockey,  MD  dicyclomine (BENTYL) 20 MG tablet Take 1 tablet (20 mg total) by mouth 2 (two) times daily. Patient not taking: Reported on 11/11/2017 10/22/17   Caccavale, Sophia, PA-C  famotidine (PEPCID) 40 MG tablet Take 1 tablet (40 mg total) by mouth daily. Can take up to 2 x day (every 12 hrs) Patient not taking: Reported on 02/02/2018 07/15/17   Debbe Odea, MD  metoCLOPramide (REGLAN) 10 MG tablet Take 1 tablet (10 mg total) by mouth every 8 (eight) hours as needed for nausea. Patient not taking: Reported on 02/02/2018 10/26/17   Roxan Hockey, MD  pantoprazole (PROTONIX) 40 MG tablet Take 1 tablet (40 mg total) by mouth daily. Patient not taking: Reported on 02/02/2018 11/15/17   Elgergawy, Silver Huguenin, MD  prochlorperazine (COMPAZINE) 25 MG suppository Place 1 suppository (25 mg total) rectally every 12 (twelve) hours as needed for nausea or vomiting. Patient not taking: Reported on 02/02/2018 10/26/17   Roxan Hockey, MD  promethazine (PHENERGAN)  25 MG tablet Take 1 tablet (25 mg total) by mouth every 6 (six) hours as needed for nausea or vomiting. Patient not taking: Reported on 02/02/2018 10/20/17   Dalia Heading, PA-C  Saccharomyces boulardii (PROBIOTIC) 250 MG CAPS Take 1 capsule by mouth daily. Patient not taking: Reported on 02/02/2018 12/15/17   Frederica Kuster, PA-C  traMADol (ULTRAM) 50 MG tablet Take 1 tablet (50 mg total) by mouth every 6 (six) hours as needed. Patient not taking: Reported on 02/02/2018 12/15/17   Orpah Greek, MD    Family History Family History  Problem Relation Age of Onset  . Arthritis Mother   . Diabetes Mother   . Hypertension Mother     Social History Social History   Tobacco Use  . Smoking status: Never Smoker  . Smokeless tobacco: Never Used  Substance Use Topics  . Alcohol use: No  . Drug use: No     Allergies   Benadryl [diphenhydramine-zinc acetate]; Motrin [ibuprofen]; Banana; Diphenhydramine hcl; Doxycycline; Iron dextran; and Shellfish allergy   Review of Systems Review of Systems  Constitutional: Negative for chills and fever.  HENT: Negative for facial swelling and sore throat.   Respiratory: Negative for shortness of breath.   Cardiovascular: Negative for chest pain.  Gastrointestinal: Positive for abdominal pain, diarrhea, nausea and vomiting. Negative for blood in stool.  Genitourinary: Negative for dysuria.  Musculoskeletal: Negative for back pain.  Skin: Negative for rash and wound.  Neurological: Negative for headaches.  Psychiatric/Behavioral: The patient is not nervous/anxious.      Physical Exam Updated Vital Signs BP (!) 126/102 (BP Location: Left Arm)   Pulse (!) 111   Temp 98.6 F (37 C) (Oral)   Resp 18   Ht 5\' 6"  (1.676 m)   Wt 79.4 kg   SpO2 98%   BMI 28.25 kg/m   Physical Exam  Constitutional: She appears well-developed and well-nourished. No distress.  Crying and moaning in pain on my exam  HENT:  Head: Normocephalic and  atraumatic.  Mouth/Throat: Oropharynx is clear and moist. No oropharyngeal exudate.  Eyes: Pupils are equal, round, and reactive to light. Conjunctivae are normal. Right eye exhibits no discharge. Left eye exhibits no discharge. No scleral icterus.  Neck: Normal range of motion. Neck supple. No thyromegaly present.  Cardiovascular: Normal rate, regular rhythm, normal heart sounds and intact distal pulses. Exam reveals no gallop and no friction rub.  No murmur heard. Pulmonary/Chest: Effort normal and breath sounds normal. No stridor. No respiratory distress.  She has no wheezes. She has no rales.  Abdominal: Soft. Bowel sounds are normal. She exhibits no distension. There is generalized tenderness and tenderness in the right lower quadrant and periumbilical area. There is no rebound and no guarding.  Scarred abdomen  Musculoskeletal: She exhibits no edema.  Lymphadenopathy:    She has no cervical adenopathy.  Neurological: She is alert. Coordination normal.  Skin: Skin is warm and dry. No rash noted. She is not diaphoretic. No pallor.  Psychiatric: She has a normal mood and affect.  Nursing note and vitals reviewed.    ED Treatments / Results  Labs (all labs ordered are listed, but only abnormal results are displayed) Labs Reviewed  COMPREHENSIVE METABOLIC PANEL - Abnormal; Notable for the following components:      Result Value   Potassium 2.9 (*)    Chloride 114 (*)    CO2 17 (*)    Glucose, Bld 185 (*)    BUN 81 (*)    Creatinine, Ser 3.96 (*)    Calcium 5.4 (*)    Albumin 2.8 (*)    GFR calc non Af Amer 14 (*)    GFR calc Af Amer 16 (*)    All other components within normal limits  CBC WITH DIFFERENTIAL/PLATELET - Abnormal; Notable for the following components:   WBC 12.0 (*)    RBC 3.56 (*)    Hemoglobin 10.1 (*)    HCT 32.3 (*)    Neutro Abs 8.0 (*)    Abs Immature Granulocytes 0.23 (*)    All other components within normal limits  LIPASE, BLOOD - Abnormal; Notable  for the following components:   Lipase 76 (*)    All other components within normal limits  URINALYSIS, ROUTINE W REFLEX MICROSCOPIC - Abnormal; Notable for the following components:   Color, Urine STRAW (*)    Glucose, UA 150 (*)    Protein, ur >=300 (*)    Bacteria, UA RARE (*)    All other components within normal limits  MAGNESIUM - Abnormal; Notable for the following components:   Magnesium 1.2 (*)    All other components within normal limits  PHOSPHORUS - Abnormal; Notable for the following components:   Phosphorus 5.5 (*)    All other components within normal limits  GLUCOSE, CAPILLARY - Abnormal; Notable for the following components:   Glucose-Capillary 110 (*)    All other components within normal limits  CBG MONITORING, ED - Abnormal; Notable for the following components:   Glucose-Capillary 149 (*)    All other components within normal limits  URINE CULTURE  MAGNESIUM  PHOSPHORUS  HIV ANTIBODY (ROUTINE TESTING W REFLEX)  BASIC METABOLIC PANEL  CBC  HEMOGLOBIN A1C  I-STAT BETA HCG BLOOD, ED ([facility], [facility], [facility] ONLY)    EKG EKG Interpretation  Date/Time:  Thursday February 02 2018 15:37:19 EST Ventricular Rate:  103 PR Interval:    QRS Duration: 100 QT Interval:  369 QTC Calculation: 483 R Axis:   38 Text Interpretation:  Sinus tachycardia Probable left atrial enlargement Borderline prolonged QT interval No significant change since last tracing Confirmed by Orlie Dakin [phone]) on 02/02/2018 3:39:19 PM   Radiology Dg Abdomen Acute W/chest  Result Date: 02/02/2018 CLINICAL DATA:  Chronic abdominal pain. EXAM: DG ABDOMEN ACUTE W/ 1V CHEST COMPARISON:  CT scan 10/21/2017 FINDINGS: The upright chest x-ray demonstrates low lung volumes with minimal streaky basilar atelectasis but no infiltrates, edema or effusions. Two views of the abdomen demonstrate a paucity of bowel  gas. Minimal scattered air and stool in the left colon. No dilated loops of small bowel are  demonstrated. The soft tissue shadows of the abdomen are maintained. No worrisome calcifications are identified. No free air. The bony structures are intact. IMPRESSION: No acute cardiopulmonary findings. No plain film findings for an acute abdominal process. Electronically Signed   By: Marijo Sanes M.D.   On: 02/02/2018 17:44    Procedures Procedures (including critical care time)  Medications Ordered in ED Medications  calcium carbonate (TUMS - dosed in mg elemental calcium) chewable tablet 1,000 mg (has no administration in time range)  loratadine (CLARITIN) tablet 10 mg (has no administration in time range)  tacrolimus (PROGRAF) capsule 3 mg (has no administration in time range)  simvastatin (ZOCOR) tablet 20 mg (has no administration in time range)  saccharomyces boulardii (FLORASTOR) capsule 250 mg (has no administration in time range)  promethazine (PHENERGAN) tablet 25 mg (has no administration in time range)  predniSONE (DELTASONE) tablet 10 mg (has no administration in time range)  acetaminophen (TYLENOL) tablet 650 mg (has no administration in time range)    Or  acetaminophen (TYLENOL) suppository 650 mg (has no administration in time range)  polyethylene glycol (MIRALAX / GLYCOLAX) packet 17 g (has no administration in time range)  ondansetron (ZOFRAN) tablet 4 mg (has no administration in time range)    Or  ondansetron (ZOFRAN) injection 4 mg (has no administration in time range)  mycophenolate (MYFORTIC) EC tablet 720 mg (has no administration in time range)  carvedilol (COREG) tablet 12.5 mg (has no administration in time range)  sodium bicarbonate tablet 1,300 mg (has no administration in time range)  calcitRIOL (ROCALTROL) capsule 0.25 mcg (has no administration in time range)  calcium carbonate (TUMS - dosed in mg elemental calcium) chewable tablet 400 mg of elemental calcium (has no administration in time range)  promethazine (PHENERGAN) injection 12.5 mg (has no  administration in time range)  metoCLOPramide (REGLAN) injection 10 mg (has no administration in time range)  hydrALAZINE (APRESOLINE) injection 5 mg (has no administration in time range)  insulin aspart (novoLOG) injection 0-9 Units (has no administration in time range)  insulin aspart (novoLOG) injection 0-5 Units (has no administration in time range)  potassium chloride 10 mEq in 100 mL IVPB (10 mEq Intravenous Transfusing/Transfer 02/02/18 2014)  calcitRIOL (ROCALTROL) capsule 0.25 mcg (has no administration in time range)  calcium acetate (PHOSLO) capsule 1,334 mg (has no administration in time range)  cholecalciferol (VITAMIN D) tablet 1,000 Units (has no administration in time range)  famotidine (PEPCID) tablet 10 mg (has no administration in time range)  fluticasone (FLONASE) 50 MCG/ACT nasal spray 2 spray (has no administration in time range)  gabapentin (NEURONTIN) capsule 300 mg (has no administration in time range)  insulin glargine (LANTUS) injection 15 Units (has no administration in time range)  sodium chloride 0.9 % bolus 1,000 mL (1,000 mLs Intravenous Transfusing/Transfer 02/02/18 2013)  dicyclomine (BENTYL) injection 20 mg (20 mg Intramuscular Given 02/02/18 1423)  metoCLOPramide (REGLAN) injection 10 mg (10 mg Intravenous Given 02/02/18 1423)  sodium chloride 0.9 % bolus 1,000 mL (1,000 mLs Intravenous Transfusing/Transfer 02/02/18 2014)  potassium chloride 10 mEq in 100 mL IVPB (0 mEq Intravenous Stopped 02/02/18 1801)  calcium gluconate 1 g/ 50 mL sodium chloride IVPB (0 g Intravenous Stopped 02/02/18 1649)  metoCLOPramide (REGLAN) injection 10 mg (10 mg Intravenous Given 02/02/18 1656)  magnesium sulfate IVPB 2 g 50 mL (0 g Intravenous Stopped 02/02/18 1743)  Initial Impression / Assessment and Plan / ED Course  I have reviewed the triage vital signs and the nursing notes.  Pertinent labs & imaging results that were available during my care of the patient were  reviewed by me and considered in my medical decision making (see chart for details).     Patient with history of type 1 diabetes, kidney transplant, CKD, chronic abdominal pain, gastroparesis who presents with abdominal pain, nausea, vomiting.  Patient found to have hypokalemia, hypocalcemia, hypomagnesemia, and hyperphosphatemia.  Abdominal pain improved with Reglan and Bentyl.  After this, patient had no tenderness.  No indication for CT at this time.  Plain film is negative.  Replacement of magnesium, calcium, potassium initiated in the ED.  I spoke with nephrologist, Dr. Johnney Ou, who will evaluate the patient and agrees with admission.  I discussed patient case with Dr. Waldron Labs with [facility] who accepts patient for admission.  I appreciate the above consultants for their assistance with the patient.  Patient also evaluated by my attending, Dr. Winfred Leeds, who guided the patient's management and agrees with plan.  Final Clinical Impressions(s) / ED Diagnoses   Final diagnoses:  AKI (acute kidney injury) [facility])  Hypocalcemia  Hypomagnesemia    ED Discharge Orders    None       Frederica Kuster, Hershal Coria 02/02/18 2138    Orlie Dakin, MD 02/03/18 [phone]

## 2018-02-02 NOTE — Consult Note (Signed)
[facility] Renal Consultation Note  Requesting MD: Dr. Lanny Cramp [facility] ED Indication for Consultation:  AKI in renal transplant  HPI: [name] is a 32 y.o. 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   She had a visit with Dr. Lorrene Reid last week for worsening edema in context of nephrotic range proteinuria.  Cr at that time 3.  She was started on furosemide 80 daily and did diurese 10lbs.  At her follow up visit today she was moaning and in severe abdominal pain, c/o N/V prompting EMS transport to ED.    Here she was treated with bentyl and reglan with initially great improvement in her symptoms.  Labs noting K 2.9, Cr 3.96, Ca 5.4, Albumin 2.8, Mag 1.2, Hb 10.1, WBC 12.  UA neg LE, nit, blood, + protein; microscopy 6-10 WBC/hpf.   At the time of my exam her nausea, epigastric abdominal pain is returning.  She denies fevers, localizing symptoms of infection.  Thinks her symptoms are just gastroparesis flare.  No sinus symptoms, sore throat, rashes, cough, dysuria, pain over renal transplant.    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 She was treated with high dose steroids with no benefit and her creatinine has continued to worsen from 1.9 to recently 3 as of 01/19/18 visit with Dr Lorrene Reid.    PMHx:   Past Medical History:  Diagnosis Date  . Chronic kidney disease   . Diabetes mellitus    Pt reports diagnosis in June 2011  . Hyperlipidemia   . Hypertension   . Kidney transplant recipient   . 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 Pyelonephritis 06/23/2014  . UTI (urinary tract infection)  01/09/2015  . 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 ARTERIOVENOUS GRAFT PLACEMENT Bilateral    "neither work" (10/24/2017)  . CHOLECYSTECTOMY N/A 06/30/2017   Procedure: LAPAROSCOPIC CHOLECYSTECTOMY WITH INTRAOPERATIVE CHOLANGIOGRAM;  Surgeon: Excell Seltzer, MD;  Location: [facility] ORS;  Service: General;  Laterality: N/A;  . ESOPHAGOGASTRODUODENOSCOPY (EGD) WITH PROPOFOL N/A 07/04/2017   Procedure: ESOPHAGOGASTRODUODENOSCOPY (EGD) WITH PROPOFOL;  Surgeon: Clarene Essex, MD;  Location: [facility] ENDOSCOPY;  Service: Endoscopy;  Laterality: N/A;  . KIDNEY TRANSPLANT  2007  . PARATHYROIDECTOMY  ?2012   "3/4 removed" (10/24/2017)  . RENAL BIOPSY Bilateral 2003    Family Hx:  Family History  Problem Relation Age of Onset  . Arthritis Mother   . Diabetes Mother   . Hypertension Mother     Social History:  reports that she has never smoked. She has never used smokeless tobacco. She reports that she does not drink alcohol or use drugs.  Allergies:  Allergies  Allergen Reactions  . Benadryl [Diphenhydramine-Zinc Acetate] Shortness Of Breath  . Motrin [Ibuprofen] Shortness Of Breath and Itching    Per pt  . Banana Other (See Comments)    Sick on the stomach  . Diphenhydramine Hcl     REACTION: Stopped breathing in ICU  . Doxycycline     Shortness of Breath   . Iron Dextran  REACTION: vein irritation  . Shellfish Allergy Hives    Medications: Prior to Admission medications   Medication Sig Start Date End Date Taking? Authorizing Provider  ACCU-CHEK SOFTCLIX LANCETS lancets Use to check blood sugar 4 times per day. 12/29/15   Renato Shin, MD  acetaminophen (TYLENOL) 500 MG tablet Take 1,000 mg by mouth daily as needed for moderate pain.     [provider]  calcium elemental as carbonate (BARIATRIC TUMS ULTRA) 400 MG chewable tablet Chew 1,000 mg by mouth 3 (three) times daily as  needed for heartburn.     [provider]  cetirizine (ZYRTEC) 10 MG tablet Take 10 mg by mouth daily as needed for allergies.     [provider]  clindamycin (CLEOCIN) 150 MG capsule Take 2 capsules (300 mg total) by mouth 4 (four) times daily. 12/15/17   Law, Bea Graff, PA-C  cloNIDine (CATAPRES - DOSED IN MG/24 HR) 0.1 mg/24hr patch Place 1 patch (0.1 mg total) onto the skin once a week. Every Wednesday Patient not taking: Reported on 11/11/2017 11/02/17   Roxan Hockey, MD  dicyclomine (BENTYL) 20 MG tablet Take 1 tablet (20 mg total) by mouth 2 (two) times daily. Patient not taking: Reported on 11/11/2017 10/22/17   Caccavale, Sophia, PA-C  famotidine (PEPCID) 40 MG tablet Take 1 tablet (40 mg total) by mouth daily. Can take up to 2 x day (every 12 hrs) Patient taking differently: Take 40 mg by mouth daily.  07/15/17   Debbe Odea, MD  fluticasone (FLONASE) 50 MCG/ACT nasal spray Place 2 sprays into both nostrils daily. Patient taking differently: Place 2 sprays into both nostrils daily as needed for allergies.  07/20/15   Muthersbaugh, Jarrett Soho, PA-C  gabapentin (NEURONTIN) 100 MG capsule Take 200 mg by mouth daily as needed (for feet).  11/12/16   [provider]  hydrOXYzine (ATARAX/VISTARIL) 50 MG tablet Take 50 mg by mouth 2 (two) times daily as needed for itching.     [provider]  insulin aspart (NOVOLOG FLEXPEN) 100 UNIT/ML FlexPen Inject 15 Units into the skin 3 (three) times daily with meals. 81-150=10 units 51-200=11 units 201-250=12 units 251-300=13 units 301-350=14 units >351=15 u Per sliding scale    [provider]  insulin degludec (TRESIBA) 100 UNIT/ML SOPN FlexTouch Pen Inject 0.2 mLs (20 Units total) into the skin at bedtime. Patient taking differently: Inject 20 Units into the skin every morning.  10/26/17   Roxan Hockey, MD  loperamide (IMODIUM) 2 MG capsule Take 2 mg by mouth as needed for diarrhea or loose stools.      [provider]  metoCLOPramide (REGLAN) 10 MG tablet Take 1 tablet (10 mg total) by mouth every 8 (eight) hours as needed for nausea. 10/26/17   Roxan Hockey, MD  mycophenolate (MYFORTIC) 360 MG TBEC Take 720 mg by mouth 2 (two) times daily.     [provider]  ondansetron (ZOFRAN ODT) 4 MG disintegrating tablet Take 1 tablet (4 mg total) by mouth every 4 (four) hours as needed for nausea or vomiting. 10/26/17   Roxan Hockey, MD  OVER THE COUNTER MEDICATION Take 1 Can by mouth 2 (two) times daily. Vitamin supplement drink    [provider]  pantoprazole (PROTONIX) 40 MG tablet Take 1 tablet (40 mg total) by mouth daily. 11/15/17   Elgergawy, Silver Huguenin, MD  potassium chloride (KLOR-CON) 20 MEQ packet Take 40 mEq by mouth daily for 5 days. Patient not taking: Reported on 11/11/2017 10/26/17 10/31/17  Roxan Hockey, MD  predniSONE (DELTASONE) 10 MG tablet Take 3 tablets (30 mg total) by mouth daily with breakfast. 11/14/17   Elgergawy, Silver Huguenin, MD  prochlorperazine (COMPAZINE) 25 MG suppository Place 1 suppository (25 mg total) rectally every 12 (twelve) hours as needed for nausea or vomiting. 10/26/17   Roxan Hockey, MD  promethazine (PHENERGAN) 25 MG tablet Take 1 tablet (25 mg total) by mouth every 6 (six) hours as needed for nausea or vomiting. 10/20/17   Lawyer, Harrell Gave, PA-C  Saccharomyces boulardii (PROBIOTIC) 250 MG CAPS Take 1 capsule by mouth daily. 12/15/17   Frederica Kuster, PA-C  simvastatin (ZOCOR) 20 MG tablet Take 20 mg by mouth every Monday, Wednesday, and Friday.     [provider]  tacrolimus (PROGRAF) 1 MG capsule Take 3 mg by mouth 2 (two) times daily.     [provider]  traMADol (ULTRAM) 50 MG tablet Take 1 tablet (50 mg total) by mouth every 6 (six) hours as needed. 12/15/17   Orpah Greek, MD    I have reviewed the patient's current medications.  Labs:  Results for orders placed or performed during the hospital  encounter of 02/02/18 (from the past 48 hour(s))  Urinalysis, Routine w reflex microscopic     Status: Abnormal   Collection Time: 02/02/18  1:48 PM  Result Value Ref Range   Color, Urine STRAW (A) YELLOW   APPearance CLEAR CLEAR   Specific Gravity, Urine 1.011 1.005 - 1.030   pH 7.0 5.0 - 8.0   Glucose, UA 150 (A) NEGATIVE mg/dL   Hgb urine dipstick NEGATIVE NEGATIVE   Bilirubin Urine NEGATIVE NEGATIVE   Ketones, ur NEGATIVE NEGATIVE mg/dL   Protein, ur >=300 (A) NEGATIVE mg/dL   Nitrite NEGATIVE NEGATIVE   Leukocytes, UA NEGATIVE NEGATIVE   RBC / HPF 0-5 0 - 5 RBC/hpf   WBC, UA 6-10 0 - 5 WBC/hpf   Bacteria, UA RARE (A) NONE SEEN    Comment: Performed at [facility], [address], [place], [place] [address]  Comprehensive metabolic panel     Status: Abnormal   Collection Time: 02/02/18  2:00 PM  Result Value Ref Range   Sodium 141 135 - 145 mmol/L   Potassium 2.9 (L) 3.5 - 5.1 mmol/L   Chloride 114 (H) 98 - 111 mmol/L   CO2 17 (L) 22 - 32 mmol/L   Glucose, Bld 185 (H) 70 - 99 mg/dL   BUN 81 (H) 6 - 20 mg/dL   Creatinine, Ser 3.96 (H) 0.44 - 1.00 mg/dL   Calcium 5.4 (LL) 8.9 - 10.3 mg/dL    Comment: CRITICAL RESULT CALLED TO, READ BACK BY AND VERIFIED WITH: Princess Perna 1532 02/02/18 D BRADLEY    Total Protein 6.5 6.5 - 8.1 g/dL   Albumin 2.8 (L) 3.5 - 5.0 g/dL   AST 16 15 - 41 U/L   ALT 21 0 - 44 U/L   Alkaline Phosphatase 52 38 - 126 U/L   Total Bilirubin 0.7 0.3 - 1.2 mg/dL   GFR calc non Af Amer 14 (L) >60 mL/min   GFR calc Af Amer 16 (L) >60 mL/min    Comment: (NOTE) The eGFR has been calculated using the CKD EPI equation. This calculation has not been validated in all clinical situations. eGFR's persistently <60 mL/min signify possible Chronic Kidney Disease.    Anion gap 10 5 - 15    Comment: Performed at [facility] [address]  [place], [place] [address]  CBC with Differential     Status: Abnormal   Collection Time: 02/02/18  2:00 PM   Result Value Ref Range   WBC 12.0 (H) 4.0 - 10.5 K/uL   RBC 3.56 (L) 3.87 - 5.11 MIL/uL   Hemoglobin 10.1 (L) 12.0 - 15.0 g/dL   HCT 32.3 (L) 36.0 - 46.0 %   MCV 90.7 80.0 - 100.0 fL   MCH 28.4 26.0 - 34.0 pg   MCHC 31.3 30.0 - 36.0 g/dL   RDW 15.3 11.5 - 15.5 %   Platelets 295 150 - 400 K/uL   nRBC 0.0 0.0 - 0.2 %   Neutrophils Relative % 67 %   Neutro Abs 8.0 (H) 1.7 - 7.7 K/uL   Lymphocytes Relative 22 %   Lymphs Abs 2.6 0.7 - 4.0 K/uL   Monocytes Relative 8 %   Monocytes Absolute 1.0 0.1 - 1.0 K/uL   Eosinophils Relative 1 %   Eosinophils Absolute 0.2 0.0 - 0.5 K/uL   Basophils Relative 0 %   Basophils Absolute 0.0 0.0 - 0.1 K/uL   Immature Granulocytes 2 %   Abs Immature Granulocytes 0.23 (H) 0.00 - 0.07 K/uL    Comment: Performed at [facility], [address], [place], [place] [address]  Lipase, blood     Status: Abnormal   Collection Time: 02/02/18  2:00 PM  Result Value Ref Range   Lipase 76 (H) 11 - 51 U/L    Comment: Performed at [facility] [address] [place], [place] [address]  I-Stat beta hCG blood, ED     Status: None   Collection Time: 02/02/18  2:12 PM  Result Value Ref Range   I-stat hCG, quantitative <5.0 <5 mIU/mL   Comment 3            Comment:   GEST. AGE      CONC.  (mIU/mL)   <=1 WEEK        5 - 50     2 WEEKS       50 - 500     3 WEEKS       100 - 10,000     4 WEEKS     1,000 - 30,000        FEMALE AND NON-PREGNANT FEMALE:     LESS THAN 5 mIU/mL   Magnesium     Status: Abnormal   Collection Time: 02/02/18  2:41 PM  Result Value Ref Range   Magnesium 1.2 (L) 1.7 - 2.4 mg/dL    Comment: Performed at [facility], [place] [address], [place], [place] [address]  Phosphorus     Status: Abnormal   Collection Time: 02/02/18  2:41 PM  Result Value Ref Range   Phosphorus 5.5 (H) 2.5 - 4.6 mg/dL    Comment: Performed at [facility] [address], [place], [place] [address]  POC CBG, ED     Status: Abnormal    Collection Time: 02/02/18  3:31 PM  Result Value Ref Range   Glucose-Capillary 149 (H) 70 - 99 mg/dL     ROS:  Pertinent items are noted in HPI.  Physical Exam: Vitals:   02/02/18 1445 02/02/18 1530  BP: (!) 142/113 (!) 111/99  Pulse: (!) 108 (!) 104  Resp: (!) 26 12  Temp:    SpO2: 99% 98%     General: uncomfortable but nontoxic HEENT: MMM Eyes: anicteric Neck: supple, no meningismus Heart: RRR,  no rub Lungs: clear to bases Abdomen: TTP epigastrium, no RLQ TTP over renal allograft Extremities: 2+ pitting to just below knees Skin: no rashes Neuro: tremulous but no asterixis  Assessment/Plan:  **AKI on CKD s/p renal transplant:  Renal function steadily worsening and no reversible issue has been identified.  Continue current immunosuppression with tacrolimus 3 BID, myfortic 720 BID, prednisone 10 daily.  In setting of active vomiting and poor po intake will hold diuretics tonight but will need further diuretics with edema.  No current indications for dialysis, but will follow.   **Anemia:  Hb improving on procrit 40,000 SQ q2weeks - will need to find out when next dose due.  May have some hemoconcentration. Iron sat 38% as of 9/26.  Follow.  **Abdominal pain, nausea and vomiting:  Suspect gastroparesis flare.  Lipase mildly elevated, do not suspect pancreatitis.  Care per primary team.   **HTN: outpatient on coreg 12.5 BID.  Cont here if tol po intake; ordered with hold parameter.   **Leukocyturia:  Urine culture pending.   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 IV calcium being given currently. Continue calcitriol (started 12/23/17) and po calcium.  **L toe wound:  Wound care weekly outpt (Dr. Dellia Nims), continue here   Justin Mend 02/02/2018, 4:02 PM

## 2018-02-02 NOTE — ED Provider Notes (Addendum)
Complains of intermittent crampy lower abdominal pain onset last night.  She denies any fever.  She states she is vomited more than 10 times and had one episode of diarrhea.  No urinary symptoms no blood per rectum no hematemesis.  She denies pain since treatment with Bentyl and Reglan while here.  She complains of mild nausea at present but feels much improved on exam she is in no distress lungs clear to auscultation abdomen multiple surgical scars, nontender   Orlie Dakin, MD 02/02/18 1520 Iv calcium andd iv potassium supplementation ordered as pt at risk for arrhythmia given electrolyte disorders, and vomiting.  May not be able to tolerate oral electrolye supplementation. CRITICAL CARE Performed by: Orlie Dakin Total critical care time: 30 minutes Critical care time was exclusive of separately billable procedures and treating other patients. Critical care was necessary to treat or prevent imminent or life-threatening deterioration. Critical care was time spent personally by me on the following activities: development of treatment plan with patient and/or surrogate as well as nursing, discussions with consultants, evaluation of patient's response to treatment, examination of patient, obtaining history from patient or surrogate, ordering and performing treatments and interventions, ordering and review of laboratory studies, ordering and review of radiographic studies, pulse oximetry and re-evaluation of patient's condition.   Orlie Dakin, MD 02/03/18 [phone]

## 2018-02-02 NOTE — ED Notes (Signed)
Unable to obtain accurate BP, patient leaned over in bed states "this is the most comfortable position for me so I'm not in pain". Patient falls asleep when in this position.   MD made aware.

## 2018-02-02 NOTE — ED Notes (Signed)
MD made aware of critical calcium level of 5.4

## 2018-02-02 NOTE — H&P (Signed)
TRH H&P   Patient Demographics:    [name], is a 32 y.o. female  MRN: [id number]   DOB - 1985/08/22  Admit Date - 02/02/2018  Outpatient Primary MD for the patient is [name], MD  Referring MD/NP/PA: PA Law,Alexandra  Outpatient Specialists: renal Dr Lorrene Reid  Patient coming from: renal office  Chief Complaint  Patient presents with  . Abdominal Pain      HPI:    [name]  is a 32 y.o. 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 -Patient reports nausea and vomiting for last 24 hours, could not keep anything in, but it resembles her previous gastroparesis, she denies fever, chills, chest pain, diarrhea, coffee-ground emesis or melena, has been followed closely with Dr. Lorrene Reid guarding worsening renal function over the last few months, baseline creatinine 1.9, most recent was 3 as of 01/19/2018, most recent renal biopsy was July 2019 which did show borderline cellular rejection on background of transplant: Neuropathy and mild I FTA, some diabetic nephropathy, he was treated with high-dose steroids with no benefit. - in ED work-up significant for multiple electrolyte abnormalities including K 2.9, Cr 3.96, Ca 5.4, Albumin 2.8, Mag 1.2, Hb 10.1, WBC 12.  UA neg LE, nit, blood, + protein; microscopy 6-10 WBC/hpf.  As well she continues to have significant nausea and vomiting despite receiving IV Reglan x2, I was called to admit.    Review of systems:    In addition to the HPI above,  No Fever-chills, No Headache, No changes with Vision or hearing, No problems swallowing food or Liquids, No Chest pain, Cough or Shortness of Breath, Planes of abdominal pain, nausea and vomiting,  no diarrhea, no coffee-ground emesis  No Blood in stool or Urine, No dysuria, No new skin rashes or bruises, No new joints pains-aches,  No new weakness, tingling, numbness in any extremity, No recent weight gain or loss, No polyuria, polydypsia or polyphagia, No significant Mental Stressors.  A full 10 point Review of Systems was done, except as stated above, all other Review of Systems were negative.   With Past History of the following :    Past Medical History:  Diagnosis Date  . Chronic kidney disease   . Diabetes mellitus    Pt reports diagnosis in June 2011  . Hyperlipidemia   . Hypertension   . Kidney transplant recipient   . 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 Pyelonephritis 06/23/2014  . UTI (urinary tract infection) 01/09/2015  . 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 ARTERIOVENOUS GRAFT PLACEMENT Bilateral    "neither work" (10/24/2017)  . CHOLECYSTECTOMY N/A 06/30/2017   Procedure: LAPAROSCOPIC CHOLECYSTECTOMY WITH INTRAOPERATIVE CHOLANGIOGRAM;  Surgeon: Excell Seltzer, MD;  Location: [facility];  Service: General;  Laterality: N/A;  . ESOPHAGOGASTRODUODENOSCOPY (EGD) WITH PROPOFOL N/A 07/04/2017   Procedure: ESOPHAGOGASTRODUODENOSCOPY (EGD) WITH PROPOFOL;  Surgeon: Clarene Essex, MD;  Location: [facility];  Service: Endoscopy;  Laterality: N/A;  . KIDNEY TRANSPLANT  2007  . PARATHYROIDECTOMY  ?2012   "3/4 removed" (10/24/2017)  . RENAL BIOPSY Bilateral 2003      Social History:     Social History   Tobacco Use  . Smoking status: Never Smoker  . Smokeless tobacco: Never Used  Substance Use Topics  . Alcohol use: No     Lives - home  Mobility -independent    Family History :     Family History  Problem Relation Age of Onset  . Arthritis Mother   . Diabetes Mother   .  Hypertension Mother       Home Medications:   Prior to Admission medications   Medication Sig Start Date End Date Taking? Authorizing Provider  Magnesium 400 MG TABS Take 400 mg by mouth 2 (two) times daily.   Yes [provider]  ACCU-CHEK SOFTCLIX LANCETS lancets Use to check blood sugar 4 times per day. 12/29/15   Renato Shin, MD  acetaminophen (TYLENOL) 500 MG tablet Take 1,000 mg by mouth daily as needed for moderate pain.     [provider]  calcium elemental as carbonate (BARIATRIC TUMS ULTRA) 400 MG chewable tablet Chew 1,000 mg by mouth 3 (three) times daily as needed for heartburn.     [provider]  cetirizine (ZYRTEC) 10 MG tablet Take 10 mg by mouth daily as needed for allergies.     [provider]  clindamycin (CLEOCIN) 150 MG capsule Take 2 capsules (300 mg total) by mouth 4 (four) times daily. 12/15/17   Law, Bea Graff, PA-C  cloNIDine (CATAPRES - DOSED IN MG/24 HR) 0.1 mg/24hr patch Place 1 patch (0.1 mg total) onto the skin once a week. Every Wednesday Patient not taking: Reported on 11/11/2017 11/02/17   Roxan Hockey, MD  dicyclomine (BENTYL) 20 MG tablet Take 1 tablet (20 mg total) by mouth 2 (two) times daily. Patient not taking: Reported on 11/11/2017 10/22/17   Caccavale, Sophia, PA-C  famotidine (PEPCID) 40 MG tablet Take 1 tablet (40 mg total) by mouth daily. Can take up to 2 x day (every 12 hrs) Patient taking differently: Take 40 mg by mouth daily.  07/15/17   Debbe Odea, MD  fluticasone (FLONASE) 50 MCG/ACT nasal spray Place 2 sprays into both nostrils daily. Patient taking differently: Place 2 sprays into both nostrils daily as needed for allergies.  07/20/15   Muthersbaugh, Jarrett Soho, PA-C  gabapentin (NEURONTIN) 100 MG capsule Take 200 mg by mouth daily as needed (for feet).  11/12/16   [provider]  hydrOXYzine (ATARAX/VISTARIL) 50 MG tablet Take 50 mg by mouth 2 (two) times daily as needed for itching.      [provider]  insulin aspart (NOVOLOG FLEXPEN) 100 UNIT/ML FlexPen Inject 15 Units into the skin 3 (three) times daily with meals. 81-150=10 units 51-200=11 units 201-250=12 units 251-300=13 units 301-350=14 units >351=15 u Per sliding scale    [provider]  insulin degludec (TRESIBA) 100 UNIT/ML SOPN FlexTouch Pen Inject 0.2 mLs (20 Units total) into the skin at bedtime. Patient taking  differently: Inject 20 Units into the skin every morning.  10/26/17   Roxan Hockey, MD  loperamide (IMODIUM) 2 MG capsule Take 2 mg by mouth as needed for diarrhea or loose stools.     [provider]  metoCLOPramide (REGLAN) 10 MG tablet Take 1 tablet (10 mg total) by mouth every 8 (eight) hours as needed for nausea. 10/26/17   Roxan Hockey, MD  mycophenolate (MYFORTIC) 360 MG TBEC Take 720 mg by mouth 2 (two) times daily.     [provider]  ondansetron (ZOFRAN ODT) 4 MG disintegrating tablet Take 1 tablet (4 mg total) by mouth every 4 (four) hours as needed for nausea or vomiting. 10/26/17   Roxan Hockey, MD  OVER THE COUNTER MEDICATION Take 1 Can by mouth 2 (two) times daily. Vitamin supplement drink    [provider]  pantoprazole (PROTONIX) 40 MG tablet Take 1 tablet (40 mg total) by mouth daily. 11/15/17   Trinitee Horgan, Silver Huguenin, MD  potassium chloride (KLOR-CON) 20 MEQ packet Take 40 mEq by mouth daily for 5 days. Patient not taking: Reported on 11/11/2017 10/26/17 10/31/17  Roxan Hockey, MD  predniSONE (DELTASONE) 10 MG tablet Take 3 tablets (30 mg total) by mouth daily with breakfast. 11/14/17   Lyle Niblett, Silver Huguenin, MD  prochlorperazine (COMPAZINE) 25 MG suppository Place 1 suppository (25 mg total) rectally every 12 (twelve) hours as needed for nausea or vomiting. 10/26/17   Roxan Hockey, MD  promethazine (PHENERGAN) 25 MG tablet Take 1 tablet (25 mg total) by mouth every 6 (six) hours as needed for nausea or vomiting. 10/20/17   Lawyer,  Harrell Gave, PA-C  Saccharomyces boulardii (PROBIOTIC) 250 MG CAPS Take 1 capsule by mouth daily. 12/15/17   Frederica Kuster, PA-C  simvastatin (ZOCOR) 20 MG tablet Take 20 mg by mouth every Monday, Wednesday, and Friday.     [provider]  tacrolimus (PROGRAF) 1 MG capsule Take 3 mg by mouth 2 (two) times daily.     [provider]  traMADol (ULTRAM) 50 MG tablet Take 1 tablet (50 mg total) by mouth every 6 (six) hours as needed. 12/15/17   Orpah Greek, MD     Allergies:     Allergies  Allergen Reactions  . Benadryl [Diphenhydramine-Zinc Acetate] Shortness Of Breath  . Motrin [Ibuprofen] Shortness Of Breath and Itching    Per pt  . Banana Other (See Comments)    Sick on the stomach  . Diphenhydramine Hcl     REACTION: Stopped breathing in ICU  . Doxycycline     Shortness of Breath   . Iron Dextran     REACTION: vein irritation  . Shellfish Allergy Hives     Physical Exam:   Vitals  Blood pressure (!) 111/99, pulse (!) 104, temperature 97.8 F (36.6 C), temperature source Oral, resp. rate 12, height 5\' 6"  (1.676 m), weight 79.4 kg, SpO2 98 %.   1. General developed female, sitting in bed in mild discomfort secondary to mental pain and nausea  2. Normal affect and insight, Not Suicidal or Homicidal, Awake Alert, Oriented X 3.  3. No F.N deficits, ALL C.Nerves Intact, Strength 5/5 all 4 extremities, Sensation intact all 4 extremities, Plantars down going.  4. Ears and Eyes appear Normal, Conjunctivae clear, PERRLA. Moist Oral Mucosa.  5. Supple Neck, No JVD, No cervical lymphadenopathy appriciated, No Carotid Bruits.  6. Symmetrical Chest wall movement, Good air movement bilaterally, CTAB.  7. RRR, No Gallops, Rubs or Murmurs, No Parasternal  Heave.  8. Positive Bowel Sounds, Abdomen Soft, gastric area tenderness to palpation, No organomegaly appriciated,No rebound -guarding or rigidity.  9.  No Cyanosis, Normal Skin Turgor, No Skin Rash or  Bruise.  10. Good muscle tone,  joints appear normal , no effusions, Normal ROM.  +2 edema bilaterally  11. No Palpable Lymph Nodes in Neck or Axillae    Data Review:    CBC Recent Labs  Lab 02/02/18 1400  WBC 12.0*  HGB 10.1*  HCT 32.3*  PLT 295  MCV 90.7  MCH 28.4  MCHC 31.3  RDW 15.3  LYMPHSABS 2.6  MONOABS 1.0  EOSABS 0.2  BASOSABS 0.0   ------------------------------------------------------------------------------------------------------------------  Chemistries  Recent Labs  Lab 02/02/18 1400 02/02/18 1441  NA 141  --   K 2.9*  --   CL 114*  --   CO2 17*  --   GLUCOSE 185*  --   BUN 81*  --   CREATININE 3.96*  --   CALCIUM 5.4*  --   MG  --  1.2*  AST 16  --   ALT 21  --   ALKPHOS 52  --   BILITOT 0.7  --    ------------------------------------------------------------------------------------------------------------------ estimated creatinine clearance is 21.3 mL/min (A) (by C-G formula based on SCr of 3.96 mg/dL (H)). ------------------------------------------------------------------------------------------------------------------ No results for input(s): TSH, T4TOTAL, T3FREE, THYROIDAB in the last 72 hours.  Invalid input(s): FREET3  Coagulation profile No results for input(s): INR, PROTIME in the last 168 hours. ------------------------------------------------------------------------------------------------------------------- No results for input(s): DDIMER in the last 72 hours. -------------------------------------------------------------------------------------------------------------------  Cardiac Enzymes No results for input(s): CKMB, TROPONINI, MYOGLOBIN in the last 168 hours.  Invalid input(s): CK ------------------------------------------------------------------------------------------------------------------ No results found for:  BNP   ---------------------------------------------------------------------------------------------------------------  Urinalysis    Component Value Date/Time   COLORURINE STRAW (A) 02/02/2018 [place] 02/02/2018 1348   LABSPEC 1.011 02/02/2018 1348   PHURINE 7.0 02/02/2018 1348   GLUCOSEU 150 (A) 02/02/2018 1348   HGBUR NEGATIVE 02/02/2018 1348   HGBUR negative 01/26/2010 [place] 02/02/2018 1348   BILIRUBINUR NEG 01/01/2011 [place] 02/02/2018 1348   PROTEINUR >=300 (A) 02/02/2018 1348   UROBILINOGEN 0.2 01/09/2015 1333   NITRITE NEGATIVE 02/02/2018 1348   LEUKOCYTESUR NEGATIVE 02/02/2018 1348    ----------------------------------------------------------------------------------------------------------------   Imaging Results:    No results found.  My personal review of EKG: Rhythm NSR, Rate  103 /min, QTc 483 , no Acute ST changes   Assessment & Plan:    Active Problems:   Anemia due to chronic kidney disease   Essential hypertension, benign   XXX syndrome   KIDNEY TRANSPLANTATION, HX OF   Immunosuppressed status (HCC)   Nausea & vomiting   Acute renal failure superimposed on stage 3 chronic kidney disease (HCC)   Type 1 diabetes mellitus with complication, uncontrolled (HCC)   AKI (acute kidney injury) (HCC)   Gastroparesis   Intractable nausea/vomiting and abdominal pain -This is most likely in the setting of gastroparesis, patient has history of multiple hospitalization in the past with multiple imaging, and multiple admission for gastroparesis, obtain abdominal x-ray, will continue with scheduled Reglan, as needed Phenergan. -Clear liquid diet, advance as tolerated -will Try to avoid narcotics especially in the setting of known gastroparesis, I have discussed this with the patient already  AKI on CKD status post renal transplant -Regiment per renal, already seen the patient, patient with progressive  decline in renal function, it is 3.9 on admission,  most recent was 3 at her renal follow-up 01/19/2018. -Diuresis on hold -Continue with immunosuppressive regimen Myfortic 720 mg twice daily, tacrolimus 3 twice daily and prednisone 10 mg daily  Anemia of chronic kidney disease -She is on Procrit as an outpatient  Hypertension -continue with Coreg 12.5 mg twice daily, will add PRN hydralazine  Pyuria - follow urine cultures  Hyperlipidemia -Continue with home dose statin  Diabetes mellitus -Continue with insulin sliding scale, resume long-acting insulin at half dose giving her p.o. is unpredictable, check A1c  Hypokalemia/hypomagnesemia/hypocalcemia -Being repleted, recheck in a.m., -Continue with Calcitrol, and p.o. calcium, received IV calcium, getting secondary hyperparathyroidism, as well she has hyperphosphatemia, renal on board.  Left toe wound -Following outpatient with Dr. Quentin Cornwall, continue with wound care.  DVT Prophylaxis  SCDs  AM Labs Ordered, also please review Full Orders  Family Communication: Admission, patients condition and plan of care including tests being ordered have been discussed with the patient  who indicate understanding and agree with the plan and Code Status.  Code Status Full  Likely DC to home  Condition GUARDED    Consults called: Renal  Admission status: Observation  Time spent in minutes : 60 MINUTES   Phillips Climes M.D on 02/02/2018 at 4:57 PM  Between 7am to 7pm - Pager - [phone]. After 7pm go to [email or web address] - password [facility]  [facility] - Office  [phone]

## 2018-02-02 NOTE — ED Notes (Signed)
This RN went to introduce self to patient who is bent over in the middle of the bed sleeping. When trying to wake the patient up she would fall asleep again before answering anymore questions about her pain. VSS.

## 2018-02-03 ENCOUNTER — Inpatient Hospital Stay (HOSPITAL_COMMUNITY): Payer: Medicare Other

## 2018-02-03 DIAGNOSIS — E872 Acidosis, unspecified: Secondary | ICD-10-CM | POA: Diagnosis present

## 2018-02-03 DIAGNOSIS — I120 Hypertensive chronic kidney disease with stage 5 chronic kidney disease or end stage renal disease: Secondary | ICD-10-CM | POA: Diagnosis present

## 2018-02-03 DIAGNOSIS — Q929 Trisomy and partial trisomy of autosomes, unspecified: Secondary | ICD-10-CM | POA: Diagnosis not present

## 2018-02-03 DIAGNOSIS — I129 Hypertensive chronic kidney disease with stage 1 through stage 4 chronic kidney disease, or unspecified chronic kidney disease: Secondary | ICD-10-CM | POA: Diagnosis not present

## 2018-02-03 DIAGNOSIS — K3184 Gastroparesis: Secondary | ICD-10-CM | POA: Diagnosis not present

## 2018-02-03 DIAGNOSIS — N3289 Other specified disorders of bladder: Secondary | ICD-10-CM | POA: Diagnosis not present

## 2018-02-03 DIAGNOSIS — R159 Full incontinence of feces: Secondary | ICD-10-CM | POA: Diagnosis present

## 2018-02-03 DIAGNOSIS — N184 Chronic kidney disease, stage 4 (severe): Secondary | ICD-10-CM

## 2018-02-03 DIAGNOSIS — E876 Hypokalemia: Secondary | ICD-10-CM | POA: Diagnosis present

## 2018-02-03 DIAGNOSIS — E785 Hyperlipidemia, unspecified: Secondary | ICD-10-CM | POA: Diagnosis present

## 2018-02-03 DIAGNOSIS — N183 Chronic kidney disease, stage 3 (moderate): Secondary | ICD-10-CM | POA: Diagnosis not present

## 2018-02-03 DIAGNOSIS — Z91018 Allergy to other foods: Secondary | ICD-10-CM | POA: Diagnosis not present

## 2018-02-03 DIAGNOSIS — Z940 Kidney transplant status: Secondary | ICD-10-CM | POA: Diagnosis not present

## 2018-02-03 DIAGNOSIS — Z794 Long term (current) use of insulin: Secondary | ICD-10-CM | POA: Diagnosis not present

## 2018-02-03 DIAGNOSIS — Q970 Karyotype 47, XXX: Secondary | ICD-10-CM | POA: Diagnosis not present

## 2018-02-03 DIAGNOSIS — T8619 Other complication of kidney transplant: Secondary | ICD-10-CM | POA: Diagnosis present

## 2018-02-03 DIAGNOSIS — Y830 Surgical operation with transplant of whole organ as the cause of abnormal reaction of the patient, or of later complication, without mention of misadventure at the time of the procedure: Secondary | ICD-10-CM | POA: Diagnosis present

## 2018-02-03 DIAGNOSIS — D631 Anemia in chronic kidney disease: Secondary | ICD-10-CM | POA: Diagnosis not present

## 2018-02-03 DIAGNOSIS — E1065 Type 1 diabetes mellitus with hyperglycemia: Secondary | ICD-10-CM | POA: Diagnosis present

## 2018-02-03 DIAGNOSIS — N186 End stage renal disease: Secondary | ICD-10-CM | POA: Diagnosis present

## 2018-02-03 DIAGNOSIS — N179 Acute kidney failure, unspecified: Secondary | ICD-10-CM | POA: Diagnosis present

## 2018-02-03 DIAGNOSIS — R109 Unspecified abdominal pain: Secondary | ICD-10-CM | POA: Diagnosis not present

## 2018-02-03 DIAGNOSIS — Z91013 Allergy to seafood: Secondary | ICD-10-CM | POA: Diagnosis not present

## 2018-02-03 DIAGNOSIS — Z886 Allergy status to analgesic agent status: Secondary | ICD-10-CM | POA: Diagnosis not present

## 2018-02-03 DIAGNOSIS — Z881 Allergy status to other antibiotic agents status: Secondary | ICD-10-CM | POA: Diagnosis not present

## 2018-02-03 DIAGNOSIS — Z888 Allergy status to other drugs, medicaments and biological substances status: Secondary | ICD-10-CM | POA: Diagnosis not present

## 2018-02-03 DIAGNOSIS — E1043 Type 1 diabetes mellitus with diabetic autonomic (poly)neuropathy: Secondary | ICD-10-CM | POA: Diagnosis present

## 2018-02-03 DIAGNOSIS — E1022 Type 1 diabetes mellitus with diabetic chronic kidney disease: Secondary | ICD-10-CM | POA: Diagnosis present

## 2018-02-03 DIAGNOSIS — N2581 Secondary hyperparathyroidism of renal origin: Secondary | ICD-10-CM | POA: Diagnosis not present

## 2018-02-03 DIAGNOSIS — N10 Acute tubulo-interstitial nephritis: Secondary | ICD-10-CM | POA: Diagnosis not present

## 2018-02-03 DIAGNOSIS — G8929 Other chronic pain: Secondary | ICD-10-CM | POA: Diagnosis present

## 2018-02-03 DIAGNOSIS — N189 Chronic kidney disease, unspecified: Secondary | ICD-10-CM | POA: Diagnosis not present

## 2018-02-03 LAB — GLUCOSE, CAPILLARY
GLUCOSE-CAPILLARY: 215 mg/dL — AB (ref 70–99)
Glucose-Capillary: 154 mg/dL — ABNORMAL HIGH (ref 70–99)
Glucose-Capillary: 149 mg/dL — ABNORMAL HIGH (ref 70–99)

## 2018-02-03 LAB — BASIC METABOLIC PANEL
Anion gap: 7 (ref 5–15)
Anion gap: 7 (ref 5–15)
BUN: 70 mg/dL — AB (ref 6–20)
BUN: 67 mg/dL — ABNORMAL HIGH (ref 6–20)
CALCIUM: 4.9 mg/dL — AB (ref 8.9–10.3)
CO2: 15 mmol/L — AB (ref 22–32)
CO2: 15 mmol/L — ABNORMAL LOW (ref 22–32)
CREATININE: 3.74 mg/dL — AB (ref 0.44–1.00)
Calcium: 5.1 mg/dL — CL (ref 8.9–10.3)
Chloride: 118 mmol/L — ABNORMAL HIGH (ref 98–111)
Chloride: 116 mmol/L — ABNORMAL HIGH (ref 98–111)
Creatinine, Ser: 3.75 mg/dL — ABNORMAL HIGH (ref 0.44–1.00)
GFR calc Af Amer: 17 mL/min — ABNORMAL LOW (ref >60)
GFR calc Af Amer: 17 mL/min — ABNORMAL LOW (ref >60)
GFR calc non Af Amer: 15 mL/min — ABNORMAL LOW (ref >60)
GFR calc non Af Amer: 15 mL/min — ABNORMAL LOW (ref >60)
GLUCOSE: 73 mg/dL (ref 70–99)
GLUCOSE: 130 mg/dL — AB (ref 70–99)
Potassium: 2.9 mmol/L — ABNORMAL LOW (ref 3.5–5.1)
Potassium: 4.1 mmol/L (ref 3.5–5.1)
Sodium: 140 mmol/L (ref 135–145)
Sodium: 138 mmol/L (ref 135–145)

## 2018-02-03 LAB — CBC
HEMATOCRIT: 26.2 % — AB (ref 36.0–46.0)
Hemoglobin: 8.1 g/dL — ABNORMAL LOW (ref 12.0–15.0)
MCH: 28.7 pg (ref 26.0–34.0)
MCHC: 30.9 g/dL (ref 30.0–36.0)
MCV: 92.9 fL (ref 80.0–100.0)
Platelets: 204 10*3/uL (ref 150–400)
RBC: 2.82 MIL/uL — ABNORMAL LOW (ref 3.87–5.11)
RDW: 15.4 % (ref 11.5–15.5)
WBC: 10.6 10*3/uL — ABNORMAL HIGH (ref 4.0–10.5)
nRBC: 0 % (ref 0.0–0.2)

## 2018-02-03 LAB — URINE CULTURE: Culture: 20000 — AB

## 2018-02-03 LAB — HEMOGLOBIN A1C
Hgb A1c MFr Bld: 10.9 % — ABNORMAL HIGH (ref 4.8–5.6)
Mean Plasma Glucose: 266.13 mg/dL

## 2018-02-03 LAB — HIV ANTIBODY (ROUTINE TESTING W REFLEX): HIV Screen 4th Generation wRfx: NONREACTIVE

## 2018-02-03 LAB — MAGNESIUM: MAGNESIUM: 1.6 mg/dL — AB (ref 1.7–2.4)

## 2018-02-03 LAB — PHOSPHORUS: PHOSPHORUS: 5.7 mg/dL — AB (ref 2.5–4.6)

## 2018-02-03 MED ORDER — SODIUM CHLORIDE 0.9 % IV SOLN
INTRAVENOUS | Status: DC | PRN
  Administered 2018-02-03: 250 mL via INTRAVENOUS

## 2018-02-03 MED ORDER — SODIUM BICARBONATE 650 MG PO TABS
1300.0000 mg | ORAL_TABLET | Freq: Three times a day (TID) | ORAL | Status: DC
  Administered 2018-02-03 – 2018-02-06 (×8): 1300 mg via ORAL
  Filled 2018-02-03 (×9): qty 2

## 2018-02-03 MED ORDER — POTASSIUM CHLORIDE 10 MEQ/100ML IV SOLN
10.0000 meq | INTRAVENOUS | Status: AC
  Administered 2018-02-03 (×4): 10 meq via INTRAVENOUS
  Filled 2018-02-03 (×4): qty 100

## 2018-02-03 MED ORDER — SODIUM CHLORIDE 0.9 % IV SOLN
INTRAVENOUS | Status: DC
  Administered 2018-02-03 (×2): via INTRAVENOUS

## 2018-02-03 MED ORDER — METOCLOPRAMIDE HCL 5 MG/ML IJ SOLN
10.0000 mg | Freq: Two times a day (BID) | INTRAMUSCULAR | Status: DC
  Administered 2018-02-03 – 2018-02-06 (×6): 10 mg via INTRAVENOUS
  Filled 2018-02-03 (×6): qty 2

## 2018-02-03 MED ORDER — CALCIUM GLUCONATE-NACL 1-0.675 GM/50ML-% IV SOLN
1.0000 g | Freq: Once | INTRAVENOUS | Status: AC
  Administered 2018-02-03: 1000 mg via INTRAVENOUS
  Filled 2018-02-03: qty 50

## 2018-02-03 MED ORDER — GABAPENTIN 300 MG PO CAPS
300.0000 mg | ORAL_CAPSULE | Freq: Every day | ORAL | Status: DC
  Administered 2018-02-04 – 2018-02-05 (×2): 300 mg via ORAL
  Filled 2018-02-03 (×2): qty 1

## 2018-02-03 MED ORDER — POTASSIUM CHLORIDE CRYS ER 20 MEQ PO TBCR
20.0000 meq | EXTENDED_RELEASE_TABLET | Freq: Two times a day (BID) | ORAL | Status: AC
  Administered 2018-02-03 – 2018-02-04 (×2): 20 meq via ORAL
  Filled 2018-02-03 (×3): qty 1

## 2018-02-03 NOTE — Progress Notes (Signed)
DOREAN, HIEBERT ([id number]) Visit Report for 02/01/2018 HPI Details Patient Name: [name], [name] Date of Service: 02/01/2018 2:30 PM Medical Record Number: [id number] Patient Account Number: [id number] Date of Birth/Sex: October 26, 1985 (32 y.o. F) Treating RN: Cornell Barman Primary Care Provider: [name] Other Clinician: Referring Provider: [name] Treating Provider/Extender: Tito Dine in Treatment: 8 History of Present Illness HPI Description: 02/14/17 on evaluation today patient appears to be doing fairly well all things considered. She tells me that around the middle of November she was sleeping close to a space heater when she woke up and sustained a burn to her right first toe. She has a history of diabetes which is uncontrolled her last hemoglobin A1c with 11.5 on December 12, 2016. She is status post having had a kidney transplant and this was necessitated by apparently a high dose of antibiotics given to her as a child. Overall she has been tolerating the wound fairly well all things considered she has been putting antibiotic ointment on the area but otherwise no other treatment. She did go to the ER twice the station left without being seen due to the long wait. She continues to have discomfort rated to be 3-4/10 which is worse with toucher cleansing of the wound. 02/24/2017 -- she has uncontrolled diabetes mellitus with hyperglycemia and her last hemoglobin A1c was 14%. I have asked her to be more careful and see her PCP regarding this. She is also not wearing bilateral compression stockings as recommended before. 03/18/17 on evaluation today patient appears to be doing very well and in fact her wound appears to be completely healed. She has been tolerating the dressing changes without complication. Fortunately she is having no pain. *** 05/26/17 patient seen today for reevaluation concerning her right great toe ulcer. She has previously been evaluated by  myself in January through the beginning of the year before subsequently being discharged. She was completely healed at that point. Unfortunately patient tells me that she's unsure of exactly what happened but this wound has reopened. Upon hearing her story it sounds as if she likely injured this utilizing a pumis stone that she uses to work on her calluses. At one point she even asked me if there was a different way that she could potentially work on all of the callous and dry skin on her foot other than utilizing the stone. With that being said her story at this point was that she felt that she may have burnt her foot specifically the great toe on a space heater that she keeps on the bed stand beside her bed. With that being said I do not think that's very likely the toes around and all the surrounding region does not appear to show any signs of thermal injury nor does this ulcer appear to really be thermal in nature. It very much looks more like a diabetic foot ulcer. Patient's most recent hemoglobin A1c was 11.2 that was in January 2019. She has not had this check since she tells me that her blood sugars run in the "300s". Otherwise not much has really changed since I saw her previously. 06/02/17- She is here in follow-up evaluation for right great toe ulcer. Plain film x-ray revealed evidence worrisome for osteomyelitis, will order MRI; we discussed these findings. She presents to the clinic with feelings of hypoglycemia, she was provided with an orange juice in her glucose was 83; despite this being a normal glucose level am not surprised she is having hypoglycemic symptoms.  She tolerated debridement. We discussed the need for tight glycemic control (her a1c has been 11 in Nov and Jan), offloading/reduced trauma and compliance with medical treatment plan. A consult for ID was placed 06/09/17-She is here in follow-up evaluation for right great toe ulcer. Her MRI is scheduled for tomorrow. The wound  is significantly improved with granulation tissue encompassing most of the wound, small amount of nonviable tissue close to the nail with uneven coloration of the toenail itself, currently no lifting. She has an appointment with podiatry and endocrinology on 4/9; an appointment with Dr. Ola Spurr on 4/11. I prescribed Levaquin last week which she has not started. Due to the improvement of the wound with granulation tissue, I cultured the wound after debridement and we will hold off on initiating Levaquin. I will reach out to her nephrologist, Dr. Lorrene Reid regarding antibiotic selection. If the MRI is negative for osteomyelitis we will cancel her appointment with Dr. Ola Spurr. She states she has been more diligent in maintaining better glycemic control with more levels being below 200 then over; she has been encouraged to maintain this for continued wound healing. 06/16/17-She is here in follow up evaluation for right great toe ulcer. 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 She does have an appointment with Dr. Ola Spurr on 4/11. Culture that was obtained last week grew oxacillin sensitive staph aureus, she was initiated on the Levaquin that was originally ordered on 3/21. She admits to taking her loading dose on Monday 4/1 and starting her 250 mg daily dose on 4/2. We will extend the Levaquin 250 mg daily through her appointment with Dr. Ola Spurr. [name], [name] ([id number]) There continues to be improvement in both measurements and appearance, we will transition from Santyl to [facility]. She states her glucose levels are consistently less than 200 with fewer times greater than 200. She will follow-up next week Readmission: 12/01/17 on evaluation today patient presents for reevaluation due to ulcers on the left foot. She tells me at this point though honestly she is a poor historian but she is unsure when these all showed up. She's been tolerating the dressing changes without complication using just  a in the ointment and a Band-Aid as needed. With that being said she did become somewhat concerned about these and therefore made the appointment to come in for further evaluation with [place]. Fortunately there is no evidence of infection at this time. 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 Currently all the wounds on the left. No fevers, chills, nausea, or vomiting noted at this time. Patientos last A1c was 8.15 October 2017. 12/08/17 on evaluation today patient actually appears to be doing much better in regard to her wounds in general. In fact the Santyl seems to have done very well as far as losing up the necrotic material at this point in time and overall I do feel she's made great progress. One of the areas appears to have healed the other three are all doing better. 12/21/17 patient is a 32 year old woman who is listed in our record is a type II diabetic although in [place] as a type I diabetic. In any case she is on insulin. She has wounds on her left foot including the dorsal left first toe medial left second toe and a thickened eschar on the left third toe. We've been using silver collagen. It doesn't appear that she is actually offloading these. 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 She still has an open area over the proximal phalanx of the left great toe. been using silver collagen 01/25/18; the patient is missed some appointments. The areas on her left second and third toe remain healed. The open area over the proximal phalanx of the left great toe apparently was healed last week as well. Then the patient noted a blister form and she became concerned and came back into the clinic.She is back in ordinary footwear. 02/01/18; the blister opened on its own. She has a small open area over the proximal phalanx of the left great toe. She also showed me a draining area on her Right leg leg  from a cat scratch this was after the clinic appointment Electronic Signature(s) Signed: 02/01/2018 5:31:58 PM By: Linton Ham MD Entered By: Linton Ham on 02/01/2018 17:04:37 [name] ([id number]) -------------------------------------------------------------------------------- Physical Exam Details Patient Name: [name] Date of Service: 02/01/2018 2:30 PM Medical Record Number: [id number] Patient Account Number: [id number] Date of Birth/Sex: 30-Jul-1985 (32 y.o. F) Treating RN: Cornell Barman Primary Care Provider: [name] Other Clinician: Referring Provider: [name] Treating Provider/Extender: Tito Dine in Treatment: 8 Constitutional Patient is hypertensive.. Pulse regular and within target range for patient.Marland Kitchen Respirations regular, non-labored and within target range.. Temperature is normal and within the target range for the patient.Marland Kitchen appears in no distress. Respiratory Respiratory effort is easy and symmetric bilaterally. Rate is normal at rest and on room air.. Cardiovascular She has elevated jugular venous pressure but no coccyx edema. As very significant edema 3-4+ bilaterally in her lower legs which is pitting. Notes Wound exam; she has a fairly clean wound on the right toe. There is no convincing evidence of infection. Electronic Signature(s) Signed: 02/01/2018 5:31:58 PM By: Linton Ham MD Entered By: Linton Ham on 02/01/2018 17:06:45 [name] ([id number]) -------------------------------------------------------------------------------- Physician Orders Details Patient Name: [name] Date of Service: 02/01/2018 2:30 PM Medical Record Number: [id number] Patient Account Number: [id number] Date of Birth/Sex: 11-Sep-1985 (32 y.o. F) Treating RN: Cornell Barman Primary Care Provider: [name] Other Clinician: Referring Provider: [name] Treating Provider/Extender: Tito Dine in Treatment:  8 Verbal / Phone Orders: No Diagnosis Coding Wound Cleansing Wound #4 Left Toe Great o Clean wound with Normal Saline. Anesthetic (add to Medication List) Wound #4 Left Toe Great o Topical Lidocaine 4% cream applied to wound bed prior to debridement (In Clinic Only). Primary Wound Dressing Wound #4 Left Toe Great o Silver Alginate Secondary Dressing Wound #4 Left Toe Great o Gauze and Kerlix/Conform Dressing Change Frequency Wound #4 Left Toe Great o Change Dressing Monday, Wednesday, Friday Follow-up Appointments Wound #4 Left Toe Great o Return Appointment in 1 week. Off-Loading Wound #4 Left Toe Great o Other: - do not let toes rub in shoes. Electronic Signature(s) Signed: 02/01/2018 5:21:06 PM By: Gretta Cool, BSN, RN, CWS, Kim RN, BSN Signed: 02/01/2018 5:31:58 PM By: Linton Ham MD Entered By: Gretta Cool, BSN, RN, CWS, Kim on 02/01/2018 14:58:00 [name], [name] ([id number]) -------------------------------------------------------------------------------- Problem List Details Patient Name: [name] Date of Service: 02/01/2018 2:30 PM Medical Record Number: [id number] Patient Account Number: [id number] Date of Birth/Sex: [birth year] (32 y.o. F) Treating RN: Cornell Barman Primary Care Provider: [name] Other Clinician: Referring Provider: [name] Treating Provider/Extender: Tito Dine in Treatment: 8 Active Problems ICD-10 Evaluated Encounter Code Description Active Date Today Diagnosis E11.621 Type 2 diabetes mellitus with foot ulcer 12/01/2017 No Yes L97.522 Non-pressure  chronic ulcer of other part of left foot with fat 12/01/2017 No Yes layer exposed Z94.0 Kidney transplant status 12/01/2017 No Yes I10 Essential (primary) hypertension 12/01/2017 No Yes Inactive Problems Resolved Problems Electronic Signature(s) Signed: 02/01/2018 5:31:58 PM By: Linton Ham MD Entered By: Linton Ham on 02/01/2018 17:01:38 [name] ([id number]) -------------------------------------------------------------------------------- Progress Note Details Patient Name: [name] Date of Service: 02/01/2018 2:30 PM Medical Record Number: [id number] Patient Account Number: [id number] Date of Birth/Sex: Dec 04, 1985 (32 y.o. F) Treating RN: Cornell Barman Primary Care Provider: [name] Other Clinician: Referring Provider: [name] Treating Provider/Extender: Tito Dine in Treatment: 8 Subjective History of Present Illness (HPI) 02/14/17 on evaluation today patient appears to be doing fairly well all things considered. She tells me that around the middle of November she was sleeping close to a space heater when she woke up and sustained a burn to her right first toe. She has a history of diabetes which is uncontrolled her last hemoglobin A1c with 11.5 on December 12, 2016. She is status post having had a kidney transplant and this was necessitated by apparently a high dose of antibiotics given to her as a child. Overall she has been tolerating the wound fairly well all things considered she has been putting antibiotic ointment on the area but otherwise no other treatment. She did go to the ER twice the station left without being seen due to the long wait. She continues to have discomfort rated to be 3-4/10 which is worse with toucher cleansing of the wound. 02/24/2017 -- she has uncontrolled diabetes mellitus with hyperglycemia and her last hemoglobin A1c was 14%. I have asked her to be more careful and see her PCP regarding this. She is also not wearing bilateral compression stockings as recommended before. 03/18/17 on evaluation today patient appears to be doing very well and in fact her wound appears to be completely healed. She has been tolerating the dressing changes without complication. Fortunately she is having no pain. *** 05/26/17 patient seen today for reevaluation concerning her right great toe  ulcer. She has previously been evaluated by myself in January through the beginning of the year before subsequently being discharged. She was completely healed at that point. Unfortunately patient tells me that she's unsure of exactly what happened but this wound has reopened. Upon hearing her story it sounds as if she likely injured this utilizing a pumis stone that she uses to work on her calluses. At one point she even asked me if there was a different way that she could potentially work on all of the callous and dry skin on her foot other than utilizing the stone. With that being said her story at this point was that she felt that she may have burnt her foot specifically the great toe on a space heater that she keeps on the bed stand beside her bed. With that being said I do not think that's very likely the toes around and all the surrounding region does not appear to show any signs of thermal injury nor does this ulcer appear to really be thermal in nature. It very much looks more like a diabetic foot ulcer. Patient's most recent hemoglobin A1c was 11.2 that was in January 2019. She has not had this check since she tells me that her blood sugars run in the "300s". Otherwise not much has really changed since I saw her previously. 06/02/17- She is here in follow-up evaluation for right great toe ulcer. Plain film  x-ray revealed evidence worrisome for osteomyelitis, will order MRI; we discussed these findings. She presents to the clinic with feelings of hypoglycemia, she was provided with an orange juice in her glucose was 83; despite this being a normal glucose level am not surprised she is having hypoglycemic symptoms. She tolerated debridement. We discussed the need for tight glycemic control (her a1c has been 11 in Nov and Jan), offloading/reduced trauma and compliance with medical treatment plan. A consult for ID was placed 06/09/17-She is here in follow-up evaluation for right great toe ulcer.  Her MRI is scheduled for tomorrow. The wound is significantly improved with granulation tissue encompassing most of the wound, small amount of nonviable tissue close to the nail with uneven coloration of the toenail itself, currently no lifting. She has an appointment with podiatry and endocrinology on 4/9; an appointment with Dr. Ola Spurr on 4/11. I prescribed Levaquin last week which she has not started. Due to the improvement of the wound with granulation tissue, I cultured the wound after debridement and we will hold off on initiating Levaquin. I will reach out to her nephrologist, Dr. Lorrene Reid regarding antibiotic selection. If the MRI is negative for osteomyelitis we will cancel her appointment with Dr. Ola Spurr. She states she has been more diligent in maintaining better glycemic control with more levels being below 200 then over; she has been encouraged to maintain this for continued wound healing. 06/16/17-She is here in follow up evaluation for right great toe ulcer. 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 She does have an appointment with Dr. Ola Spurr on 4/11. Culture that was obtained last week grew oxacillin sensitive staph aureus, she was initiated on the Levaquin that was originally ordered on 3/21. She admits to taking her loading dose on Monday 4/1 and starting her 250 mg daily dose on 4/2. We will extend the Levaquin 250 mg daily through her appointment with Dr. Ola Spurr. There continues to be improvement in both measurements and appearance, we will transition from Santyl to [facility]. She states her glucose levels are consistently less than 200 with fewer times greater than 200. She will follow-up next week [name], [name] ([id number]) Readmission: 12/01/17 on evaluation today patient presents for reevaluation due to ulcers on the left foot. She tells me at this point though honestly she is a poor historian but she is unsure when these all showed up. She's been tolerating the  dressing changes without complication using just a in the ointment and a Band-Aid as needed. With that being said she did become somewhat concerned about these and therefore made the appointment to come in for further evaluation with [place]. Fortunately there is no evidence of infection at this time. 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 Currently all the wounds on the left. No fevers, chills, nausea, or vomiting noted at this time. Patient s last A1c was 8.15 October 2017. 12/08/17 on evaluation today patient actually appears to be doing much better in regard to her wounds in general. In fact the Santyl seems to have done very well as far as losing up the necrotic material at this point in time and overall I do feel she's made great progress. One of the areas appears to have healed the other three are all doing better. 12/21/17 patient is a 32 year old woman who is listed in our record is a type II diabetic although in [place] as a type I diabetic. In any case she is on insulin. She has wounds on her left  foot including the dorsal left first toe medial left second toe and a thickened eschar on the left third toe. We've been using silver collagen. It doesn't appear that she is actually offloading these. 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 She still has an open area over the proximal phalanx of the left great toe. been using silver collagen 01/25/18; the patient is missed some appointments. The areas on her left second and third toe remain healed. The open area over the proximal phalanx of the left great toe apparently was healed last week as well. Then the patient noted a blister form and she became concerned and came back into the clinic.She is back in ordinary footwear. 02/01/18; the blister opened on its own. She has a small open area over the proximal phalanx of the left great toe. She  also showed me a draining area on her Right leg leg from a cat scratch this was after the clinic appointment Objective Constitutional Patient is hypertensive.. Pulse regular and within target range for patient.Marland Kitchen Respirations regular, non-labored and within target range.. Temperature is normal and within the target range for the patient.Marland Kitchen appears in no distress. Vitals Time Taken: 2:40 PM, Height: 66 in, Weight: 174 lbs, BMI: 28.1, Temperature: 98.4 F, Pulse: 97 bpm, Respiratory Rate: 16 breaths/min, Blood Pressure: 151/96 mmHg. Respiratory Respiratory effort is easy and symmetric bilaterally. Rate is normal at rest and on room air.. Cardiovascular She has elevated jugular venous pressure but no coccyx edema. As very significant edema 3-4+ bilaterally in her lower legs which is pitting. General Notes: Wound exam; she has a fairly clean wound on the right toe. There is no convincing evidence of infection. Integumentary (Hair, Skin) Wound #4 status is Open. Original cause of wound was Gradually Appeared. The wound is located on the Left Toe Great. The wound measures 0.3cm length x 0.6cm width x 0.1cm depth; 0.141cm^2 area and 0.014cm^3 volume. There is Fat Layer (Subcutaneous Tissue) Exposed exposed. There is no tunneling or undermining noted. There is a none present amount of drainage noted. The wound margin is flat and intact. There is no granulation within the wound bed. There is no necrotic tissue within the wound bed. The periwound skin appearance exhibited: Scarring. The periwound skin appearance did not exhibit: [name], [name]. ([id number]) Callus, Crepitus, Excoriation, Induration, Rash, Dry/Scaly, Maceration, Atrophie Blanche, Cyanosis, Ecchymosis, Hemosiderin Staining, Mottled, Pallor, Rubor, Erythema. Periwound temperature was noted as No Abnormality. Assessment Active Problems ICD-10 Type 2 diabetes mellitus with foot ulcer Non-pressure chronic ulcer of other part of left foot  with fat layer exposed Kidney transplant status Essential (primary) hypertension Plan Wound Cleansing: Wound #4 Left Toe Great: Clean wound with Normal Saline. Anesthetic (add to Medication List): Wound #4 Left Toe Great: Topical Lidocaine 4% cream applied to wound bed prior to debridement (In Clinic Only). Primary Wound Dressing: Wound #4 Left Toe Great: Silver Alginate Secondary Dressing: Wound #4 Left Toe Great: Gauze and Kerlix/Conform Dressing Change Frequency: Wound #4 Left Toe Great: Change Dressing Monday, Wednesday, Friday Follow-up Appointments: Wound #4 Left Toe Great: Return Appointment in 1 week. Off-Loading: Wound #4 Left Toe Great: Other: - do not let toes rub in shoes. #1 we continued with the silver alginate/kerlix/con form #2 she has very significant edema of both legs the draining area on the right was a small Scratch that this is edema fluid. She paradoxically has an appointment tomorrow with  her nephrologist and I asked him to look at the amount of edema in her leg vis--vis the amount of Lasix she is on. She tells me that she is on 80 mg. There is certainly no evidence of an infection or a DVT Electronic Signature(s) [name], [name] ([id number]) Signed: 02/01/2018 5:31:58 PM By: Linton Ham MD Entered By: Linton Ham on 02/01/2018 17:10:43 [name] ([id number]) -------------------------------------------------------------------------------- SuperBill Details Patient Name: [name] Date of Service: 02/01/2018 Medical Record Number: [id number] Patient Account Number: [id number] Date of Birth/Sex: 1985-05-31 (32 y.o. F) Treating RN: Cornell Barman Primary Care Provider: [name] Other Clinician: Referring Provider: [name] Treating Provider/Extender: Tito Dine in Treatment: 8 Diagnosis Coding ICD-10 Codes Code Description E11.621 Type 2 diabetes mellitus with foot ulcer L97.522 Non-pressure chronic ulcer of  other part of left foot with fat layer exposed Z94.0 Kidney transplant status I10 Essential (primary) hypertension Facility Procedures CPT4 Code: [id number] Description: [phone] - WOUND CARE VISIT-LEV 2 EST PT Modifier: Quantity: 1 Physician Procedures CPT4 Code: [id number] Description: 33383 - WC PHYS LEVEL 3 - EST PT ICD-10 Diagnosis Description L97.522 Non-pressure chronic ulcer of other part of left foot with fat E11.621 Type 2 diabetes mellitus with foot ulcer Modifier: layer exposed Quantity: 1 Electronic Signature(s) Signed: 02/01/2018 5:31:58 PM By: Linton Ham MD Entered By: Linton Ham on 02/01/2018 17:11:08

## 2018-02-03 NOTE — Consult Note (Addendum)
[place] Nurse wound consult note Reason for Consult: Consult requested for great toe.  Pt is familiar to the [place] team from a previous admission on 5/21, and was being followed by the outpatient wound care center in the past. Wound is almost healed at this time. Wound type: Resolving chronic full thickness wound to anterior great toe Measurement: .5X.8X.1cm Wound bed: lighter colored brown skin to wound bed, no odor, drainage, or fluctuance Periwound: intact skin surrounding Dressing procedure/placement/frequency: Foam dressing to protect and promote healing Please re-consult if further assistance is needed.  Thank-you,  [name] MSN, [place], [place], [place], [place]

## 2018-02-03 NOTE — Progress Notes (Signed)
Patient ID: [name], female   DOB: Feb 06, 1986, 32 y.o.   MRN: [id number] [facility] Progress Note   Assessment/ Plan:   1.  Acute kidney injury on chronic kidney disease stage IV T: Unfortunately with recent worsening allograft function in the setting of nephrotic range proteinuria with unsuccessful attempts at treating elements of rejection.  Current worsening renal function appears to be possibly hemodynamically mediated with recent adjustment of diuretic therapy to alleviate volume overload and thereafter GI symptoms limiting intake.  We will continue gentle intravenous fluids for the next 24 hours and reassess need to continue the same based on renal function/clinical exam. 2.  Hypokalemia: With associated hypomagnesemia noted, will replace magnesium via intravenous route and potassium via oral route. 3.  Mixed gap/non-gap metabolic acidosis: Likely from her progressive chronic kidney disease and compounded by recent acute kidney injury-we will increase sodium bicarbonate to 1300 mg 3 times daily 4.  Abdominal pain/nausea/vomiting: Appears to be improving with symptomatic management-plausible that this might be gastroparesis however, need to rule out allograft pyelonephritis.   5.  Anasarca: Secondary to nephrotic range proteinuria/progressive chronic kidney disease-currently undertaking some rehydration to expand intravascular volume status following which diuretic therapy will be restarted  Subjective:   Reports to be feeling fair-denies any complaints-improving abdominal pain/nausea/vomiting.   Objective:   BP 133/81 (BP Location: Left Arm)   Pulse 93   Temp 98.2 F (36.8 C) (Oral)   Resp 18   Ht 5\' 6"  (1.676 m)   Wt 81 kg   SpO2 99%   BMI 28.81 kg/m   Intake/Output Summary (Last 24 hours) at 02/03/2018 1026 Last data filed at 02/03/2018 0701 Gross per 24 hour  Intake 460 ml  Output 1 ml  Net 459 ml   Weight change:   Physical Exam: Gen: Appears  comfortable resting in bed, sleeping on her side CVS: Pulse regular rhythm, normal rate, S1 and S2 normal Resp: Diminished breath sounds over bases-poor inspiratory effort Abd: Soft, obese, mild tenderness over epigastric area Ext: 2-3+ lower extremity edema  Imaging: Dg Abdomen Acute W/chest  Result Date: 02/02/2018 CLINICAL DATA:  Chronic abdominal pain. EXAM: DG ABDOMEN ACUTE W/ 1V CHEST COMPARISON:  CT scan 10/21/2017 FINDINGS: The upright chest x-ray demonstrates low lung volumes with minimal streaky basilar atelectasis but no infiltrates, edema or effusions. Two views of the abdomen demonstrate a paucity of bowel gas. Minimal scattered air and stool in the left colon. No dilated loops of small bowel are demonstrated. The soft tissue shadows of the abdomen are maintained. No worrisome calcifications are identified. No free air. The bony structures are intact. IMPRESSION: No acute cardiopulmonary findings. No plain film findings for an acute abdominal process. Electronically Signed   By: Marijo Sanes M.D.   On: 02/02/2018 17:44    Labs: BMET Recent Labs  Lab 02/02/18 1400 02/02/18 1441 02/03/18 0447  NA 141  --  140  K 2.9*  --  2.9*  CL 114*  --  118*  CO2 17*  --  15*  GLUCOSE 185*  --  73  BUN 81*  --  70*  CREATININE 3.96*  --  3.74*  CALCIUM 5.4*  --  4.9*  PHOS  --  5.5* 5.7*   CBC Recent Labs  Lab 02/02/18 1400 02/03/18 0447  WBC 12.0* 10.6*  NEUTROABS 8.0*  --   HGB 10.1* 8.1*  HCT 32.3* 26.2*  MCV 90.7 92.9  PLT 295 204    Medications:    .  calcitRIOL  0.25 mcg Oral Daily  . calcium acetate  1,334 mg Oral TID WC  . calcium carbonate  2 tablet Oral BID  . carvedilol  12.5 mg Oral BID WC  . cholecalciferol  1,000 Units Oral Daily  . famotidine  10 mg Oral Daily  . fluticasone  2 spray Each Nare Daily  . [START ON 02/04/2018] gabapentin  300 mg Oral QHS  . insulin aspart  0-5 Units Subcutaneous QHS  . insulin aspart  0-9 Units Subcutaneous TID WC  .  insulin glargine  15 Units Subcutaneous QHS  . loratadine  10 mg Oral Daily  . metoCLOPramide (REGLAN) injection  10 mg Intravenous Q8H  . mycophenolate  720 mg Oral BID  . predniSONE  10 mg Oral Q breakfast  . saccharomyces boulardii  250 mg Oral Daily  . simvastatin  20 mg Oral Q M,W,F  . sodium bicarbonate  1,300 mg Oral BID  . tacrolimus  3 mg Oral BID   Elmarie Shiley, MD 02/03/2018, 10:26 AM

## 2018-02-03 NOTE — Progress Notes (Signed)
[facility] PROGRESS NOTE  [name] NWG:[id number] DOB: 1985/08/05 DOA: 02/02/2018 PCP: [name], MD  Assessment/Plan:   Acute kidney injury on chronic kidney disease stage IV : evaluated by nephrology who opined recent worsening allograft function in the setting of nephrotic range proteinuria with unsuccessful attempts at treating elements of rejection. Nephrology also opines current worsening renal function possibly hemodynamically mediated with recent adjustment of diuretic therapy to alleviate volume overload and thereafter GI symptoms limiting intake. Recommended gentle IV fluids for 24 hours. -IV fluids 50cc/hour -holding home lasix - Appreciate nephrology assistance. 2.  Hypokalemia: potassium 2.9. Mag on low end of normal. Likely related to above -replete -recheck 3.  Mixed gap/non-gap metabolic acidosis: per nephrology likely related to  progressive chronic kidney disease and compounded by recent acute kidney injury - increase sodium bicarbonate to 1300 mg 3 times daily per cardiology 4.  Abdominal pain/nausea/vomiting:  Likely gastroparesis. Several admissions for same. Improved this am. She was having BMs in bed. Of note, nephrology noted need to rule out allograft pyelonephritis.   5.  Anasarca: Secondary to above.  - per nephrology recommendation provide some rehydration to expand intravascular volume status  -defer management to nephrology 6. Diabetes. A1c 10.9 up from 8.3 10/2017. Home regimen includes tresiba and novolog. -lantus 15 units -SSI for optimal control -appreciate diabetes coordinator assistance 7. Hypertension. Poor control during hospitalization. Home meds include coreg, catapress patch, lasix -monitor  Code Status: full Family Communication: none present Disposition Plan: home    Consultants:  [facility] nephrology  Procedures:    Antibiotics:    HPI/Subjective: Lying in bed eyes closed. Responds occasionally to verbal stimuli. Not  engaging in interview or exam.  Objective: Vitals:   02/03/18 0447 02/03/18 0737  BP: 93/69 133/81  Pulse: 83 93  Resp: 18 18  Temp: 98.1 F (36.7 C) 98.2 F (36.8 C)  SpO2: 99% 99%    Intake/Output Summary (Last 24 hours) at 02/03/2018 1608 Last data filed at 02/03/2018 1030 Gross per 24 hour  Intake 880 ml  Output 1 ml  Net 879 ml   Filed Weights   02/02/18 1326 02/02/18 2100  Weight: 79.4 kg 81 kg    Exam:   General:  Obese eyes closed not cooperative  Cardiovascular: rrr no mgr trace LE edema  Respiratory: normal effort BS distant no wheeze no rhonchi  Abdomen: obese soft +BS no guarding or rebounding  Musculoskeletal: joints without swelling/erythema   Data Reviewed: Basic Metabolic Panel: Recent Labs  Lab 02/02/18 1400 02/02/18 1441 02/03/18 0447  NA 141  --  140  K 2.9*  --  2.9*  CL 114*  --  118*  CO2 17*  --  15*  GLUCOSE 185*  --  73  BUN 81*  --  70*  CREATININE 3.96*  --  3.74*  CALCIUM 5.4*  --  4.9*  MG  --  1.2* 1.6*  PHOS  --  5.5* 5.7*   Liver Function Tests: Recent Labs  Lab 02/02/18 1400  AST 16  ALT 21  ALKPHOS 52  BILITOT 0.7  PROT 6.5  ALBUMIN 2.8*   Recent Labs  Lab 02/02/18 1400  LIPASE 76*   No results for input(s): AMMONIA in the last 168 hours. CBC: Recent Labs  Lab 02/02/18 1400 02/03/18 0447  WBC 12.0* 10.6*  NEUTROABS 8.0*  --   HGB 10.1* 8.1*  HCT 32.3* 26.2*  MCV 90.7 92.9  PLT 295 204   Cardiac Enzymes: No results for  input(s): CKTOTAL, CKMB, CKMBINDEX, TROPONINI in the last 168 hours. BNP (last 3 results) No results for input(s): BNP in the last 8760 hours.  ProBNP (last 3 results) No results for input(s): PROBNP in the last 8760 hours.  CBG: Recent Labs  Lab 02/02/18 1531 02/02/18 2038 02/03/18 0737 02/03/18 1116  GLUCAP 149* 110* 154* 149*    Recent Results (from the past 240 hour(s))  Urine culture     Status: Abnormal   Collection Time: 02/02/18  3:20 PM  Result Value Ref  Range Status   Specimen Description URINE, RANDOM  Final   Special Requests   Final    NONE Performed at [facility], [place] [address] [place], [place] [address]    Culture (A)  Final    20,000 COLONIES/mL MULTIPLE SPECIES PRESENT, SUGGEST RECOLLECTION   Report Status 02/03/2018 FINAL  Final     Studies: Dg Abdomen Acute W/chest  Result Date: 02/02/2018 CLINICAL DATA:  Chronic abdominal pain. EXAM: DG ABDOMEN ACUTE W/ 1V CHEST COMPARISON:  CT scan 10/21/2017 FINDINGS: The upright chest x-ray demonstrates low lung volumes with minimal streaky basilar atelectasis but no infiltrates, edema or effusions. Two views of the abdomen demonstrate a paucity of bowel gas. Minimal scattered air and stool in the left colon. No dilated loops of small bowel are demonstrated. The soft tissue shadows of the abdomen are maintained. No worrisome calcifications are identified. No free air. The bony structures are intact. IMPRESSION: No acute cardiopulmonary findings. No plain film findings for an acute abdominal process. Electronically Signed   By: Marijo Sanes M.D.   On: 02/02/2018 17:44    Scheduled Meds: . calcitRIOL  0.25 mcg Oral Daily  . calcium acetate  1,334 mg Oral TID WC  . calcium carbonate  2 tablet Oral BID  . carvedilol  12.5 mg Oral BID WC  . cholecalciferol  1,000 Units Oral Daily  . famotidine  10 mg Oral Daily  . fluticasone  2 spray Each Nare Daily  . [START ON 02/04/2018] gabapentin  300 mg Oral QHS  . insulin aspart  0-5 Units Subcutaneous QHS  . insulin aspart  0-9 Units Subcutaneous TID WC  . insulin glargine  15 Units Subcutaneous QHS  . loratadine  10 mg Oral Daily  . metoCLOPramide (REGLAN) injection  10 mg Intravenous Q12H  . mycophenolate  720 mg Oral BID  . potassium chloride  20 mEq Oral BID  . predniSONE  10 mg Oral Q breakfast  . saccharomyces boulardii  250 mg Oral Daily  . simvastatin  20 mg Oral Q M,W,F  . sodium bicarbonate  1,300 mg Oral TID  . tacrolimus   3 mg Oral BID   Continuous Infusions: . sodium chloride 50 mL/hr at 02/03/18 1544    Principal Problem:   Acute renal failure superimposed on stage 4 chronic kidney disease ([place]) Active Problems:   Essential hypertension, benign   XXX syndrome   Type 1 diabetes mellitus with complication, uncontrolled (HCC)   AKI (acute kidney injury) ([facility])   Anemia due to chronic kidney disease   KIDNEY TRANSPLANTATION, HX OF   Immunosuppressed status (HCC)   Nausea & vomiting   Gastroparesis   Metabolic acidosis   Hypokalemia   Incontinence of bowel    Time spent: 45 minutes    [place] [facility]  If 7PM-7AM, please contact night-coverage at [email or web address], password [facility] 02/03/2018, 4:08 PM  LOS: 0 days

## 2018-02-03 NOTE — Progress Notes (Signed)
VICTOIRE, DEANS ([id number]) Visit Report for 02/01/2018 Arrival Information Details Patient Name: [name], [name] Date of Service: 02/01/2018 2:30 PM Medical Record Number: [id number] Patient Account Number: [id number] Date of Birth/Sex: 04-11-1985 (32 y.o. F) Treating RN: Secundino Ginger Primary Care Jullian Previti: [name] Other Clinician: Referring Cora Brierley: [name] Treating Suha Schoenbeck/Extender: Tito Dine in Treatment: 8 Visit Information History Since Last Visit Added or deleted any medications: No Patient Arrived: Ambulatory Any new allergies or adverse reactions: No Arrival Time: 14:36 Had a fall or experienced change in No Accompanied By: self activities of daily living that may affect Transfer Assistance: None risk of falls: Patient Identification Verified: Yes Signs or symptoms of abuse/neglect since last visito No Secondary Verification Process Yes Hospitalized since last visit: No Completed: Implantable device outside of the clinic excluding No Patient Has Alerts: Yes cellular tissue based products placed in the center Patient Alerts: ABI 02/14/17 L 1.07 R since last visit: 1.11 Has Dressing in Place as Prescribed: Yes Pain Present Now: No Electronic Signature(s) Signed: 02/01/2018 4:20:57 PM By: Secundino Ginger Entered By: Secundino Ginger on 02/01/2018 14:39:13 [name] ([id number]) -------------------------------------------------------------------------------- Clinic Level of Care Assessment Details Patient Name: [name] Date of Service: 02/01/2018 2:30 PM Medical Record Number: [id number] Patient Account Number: [id number] Date of Birth/Sex: 1986-02-09 (32 y.o. F) Treating RN: Cornell Barman Primary Care Zephaniah Lubrano: [name] Other Clinician: Referring Adeja Sarratt: [name] Treating Yaeko Fazekas/Extender: Tito Dine in Treatment: 8 Clinic Level of Care Assessment Items TOOL 4 Quantity Score []  - Use when only an EandM is  performed on FOLLOW-UP visit 0 ASSESSMENTS - Nursing Assessment / Reassessment X - Reassessment of Co-morbidities (includes updates in patient status) 1 10 X- 1 5 Reassessment of Adherence to Treatment Plan ASSESSMENTS - Wound and Skin Assessment / Reassessment X - Simple Wound Assessment / Reassessment - one wound 1 5 []  - 0 Complex Wound Assessment / Reassessment - multiple wounds []  - 0 Dermatologic / Skin Assessment (not related to wound area) ASSESSMENTS - Focused Assessment []  - Circumferential Edema Measurements - multi extremities 0 []  - 0 Nutritional Assessment / Counseling / Intervention []  - 0 Lower Extremity Assessment (monofilament, tuning fork, pulses) []  - 0 Peripheral Arterial Disease Assessment (using hand held doppler) ASSESSMENTS - Ostomy and/or Continence Assessment and Care []  - Incontinence Assessment and Management 0 []  - 0 Ostomy Care Assessment and Management (repouching, etc.) PROCESS - Coordination of Care X - Simple Patient / Family Education for ongoing care 1 15 []  - 0 Complex (extensive) Patient / Family Education for ongoing care X- 1 10 Staff obtains Programmer, systems, Records, Test Results / Process Orders []  - 0 Staff telephones HHA, Nursing Homes / Clarify orders / etc []  - 0 Routine Transfer to another Facility (non-emergent condition) []  - 0 Routine Hospital Admission (non-emergent condition) []  - 0 New Admissions / Biomedical engineer / Ordering NPWT, Apligraf, etc. []  - 0 Emergency Hospital Admission (emergent condition) X- 1 10 Simple Discharge Coordination [name], [name] ([id number]) []  - 0 Complex (extensive) Discharge Coordination PROCESS - Special Needs []  - Pediatric / Minor Patient Management 0 []  - 0 Isolation Patient Management []  - 0 Hearing / Language / Visual special needs []  - 0 Assessment of Community assistance (transportation, D/C planning, etc.) []  - 0 Additional assistance / Altered mentation []  - 0 Support  Surface(s) Assessment (bed, cushion, seat, etc.) INTERVENTIONS - Wound Cleansing / Measurement X - Simple Wound Cleansing - one wound 1 5 []  -  0 Complex Wound Cleansing - multiple wounds []  - 0 Wound Imaging (photographs - any number of wounds) []  - 0 Wound Tracing (instead of photographs) []  - 0 Simple Wound Measurement - one wound []  - 0 Complex Wound Measurement - multiple wounds INTERVENTIONS - Wound Dressings X - Small Wound Dressing one or multiple wounds 1 10 []  - 0 Medium Wound Dressing one or multiple wounds []  - 0 Large Wound Dressing one or multiple wounds []  - 0 Application of Medications - topical []  - 0 Application of Medications - injection INTERVENTIONS - Miscellaneous []  - External ear exam 0 []  - 0 Specimen Collection (cultures, biopsies, blood, body fluids, etc.) []  - 0 Specimen(s) / Culture(s) sent or taken to Lab for analysis []  - 0 Patient Transfer (multiple staff / Civil Service fast streamer / Similar devices) []  - 0 Simple Staple / Suture removal (25 or less) []  - 0 Complex Staple / Suture removal (26 or more) []  - 0 Hypo / Hyperglycemic Management (close monitor of Blood Glucose) []  - 0 Ankle / Brachial Index (ABI) - do not check if billed separately X- 1 5 Vital Signs [name], [name] ([id number]) Has the patient been seen at the hospital within the last three years: Yes Total Score: 75 Level Of Care: New/Established - Level 2 Electronic Signature(s) Signed: 02/01/2018 5:21:06 PM By: Gretta Cool, BSN, RN, CWS, Kim RN, BSN Entered By: Gretta Cool, BSN, RN, CWS, Kim on 02/01/2018 14:59:51 [name] ([id number]) -------------------------------------------------------------------------------- Encounter Discharge Information Details Patient Name: [name] Date of Service: 02/01/2018 2:30 PM Medical Record Number: [id number] Patient Account Number: [id number] Date of Birth/Sex: 1985-04-26 (32 y.o. F) Treating RN: Cornell Barman Primary Care Donte Lenzo: [name]  Other Clinician: Referring Sosaia Pittinger: [name] Treating Corinne Goucher/Extender: Tito Dine in Treatment: 8 Encounter Discharge Information Items Discharge Condition: Stable Ambulatory Status: Ambulatory Discharge Destination: Home Transportation: Private Auto Accompanied By: self Schedule Follow-up Appointment: Yes Clinical Summary of Care: Electronic Signature(s) Signed: 02/01/2018 5:21:06 PM By: Gretta Cool, BSN, RN, CWS, Kim RN, BSN Entered By: Gretta Cool, BSN, RN, CWS, Kim on 02/01/2018 15:02:29 [name] ([id number]) -------------------------------------------------------------------------------- Lower Extremity Assessment Details Patient Name: [name] Date of Service: 02/01/2018 2:30 PM Medical Record Number: [id number] Patient Account Number: [id number] Date of Birth/Sex: Aug 05, 1985 (32 y.o. F) Treating RN: Secundino Ginger Primary Care Mccall Will: [name] Other Clinician: Referring Emery Binz: [name] Treating Giulietta Prokop/Extender: Tito Dine in Treatment: 8 Edema Assessment Assessed: [Left: No] [Right: No] [Left: Edema] [Right: :] Calf Left: Right: Point of Measurement: 32 cm From Medial Instep 40 cm cm Ankle Left: Right: Point of Measurement: 10 cm From Medial Instep 21 cm cm Vascular Assessment Claudication: Claudication Assessment [Left:None] Pulses: Dorsalis Pedis Palpable: [Left:Yes] Posterior Tibial Extremity colors, hair growth, and conditions: Extremity Color: [Left:Normal] Hair Growth on Extremity: [Left:No] Temperature of Extremity: [Left:Warm] Capillary Refill: [Left:< 3 seconds] Toe Nail Assessment Left: Right: Thick: No Discolored: No Deformed: No Improper Length and Hygiene: No Electronic Signature(s) Signed: 02/01/2018 4:20:57 PM By: Secundino Ginger Entered By: Secundino Ginger on 02/01/2018 14:47:20 [name] ([id number]) -------------------------------------------------------------------------------- Multi Wound  Chart Details Patient Name: [name] Date of Service: 02/01/2018 2:30 PM Medical Record Number: [id number] Patient Account Number: [id number] Date of Birth/Sex: 1985/06/18 (32 y.o. F) Treating RN: Cornell Barman Primary Care Jaima Janney: [name] Other Clinician: Referring Ethelene Closser: [name] Treating Rickey Sadowski/Extender: Tito Dine in Treatment: 8 Vital Signs Height(in): 66 Pulse(bpm): 97 Weight(lbs): 174 Blood Pressure(mmHg): 151/96 Body Mass Index(BMI): 28 Temperature(F):  98.4 Respiratory Rate 16 (breaths/min): Photos: [N/A:N/A] Wound Location: Left Toe Great N/A N/A Wounding Event: Gradually Appeared N/A N/A Primary Etiology: Diabetic Wound/Ulcer of the N/A N/A Lower Extremity Comorbid History: Anemia, Hypertension, Type II N/A N/A Diabetes, Osteoarthritis, Neuropathy, Seizure Disorder Date Acquired: 11/07/2017 N/A N/A Weeks of Treatment: 8 N/A N/A Wound Status: Open N/A N/A Pending Amputation on Yes N/A N/A Presentation: Measurements L x W x D 0.3x0.6x0.1 N/A N/A (cm) Area (cm) : 0.141 N/A N/A Volume (cm) : 0.014 N/A N/A % Reduction in Area: 94.80% N/A N/A % Reduction in Volume: 98.30% N/A N/A Classification: Grade 1 N/A N/A Exudate Amount: None Present N/A N/A Wound Margin: Flat and Intact N/A N/A Granulation Amount: None Present (0%) N/A N/A Necrotic Amount: None Present (0%) N/A N/A Exposed Structures: Fat Layer (Subcutaneous N/A N/A Tissue) Exposed: Yes Fascia: No Tendon: No Muscle: No ELLY, [name] ([id number]) Joint: No Bone: No Epithelialization: None N/A N/A Periwound Skin Texture: Scarring: Yes N/A N/A Excoriation: No Induration: No Callus: No Crepitus: No Rash: No Periwound Skin Moisture: Maceration: No N/A N/A Dry/Scaly: No Periwound Skin Color: Atrophie Blanche: No N/A N/A Cyanosis: No Ecchymosis: No Erythema: No Hemosiderin Staining: No Mottled: No Pallor: No Rubor: No Temperature: No Abnormality N/A  N/A Tenderness on Palpation: No N/A N/A Wound Preparation: Ulcer Cleansing: N/A N/A Rinsed/Irrigated with Saline Topical Anesthetic Applied: Other: lidocaine 4% Treatment Notes Wound #4 (Left Toe Great) Notes silver alginate, bandaid Electronic Signature(s) Signed: 02/01/2018 5:31:58 PM By: Linton Ham MD Entered By: Linton Ham on 02/01/2018 17:03:14 [name] ([id number]) -------------------------------------------------------------------------------- Mina Details Patient Name: [name] Date of Service: 02/01/2018 2:30 PM Medical Record Number: [id number] Patient Account Number: [id number] Date of Birth/Sex: [birth year] (32 y.o. F) Treating RN: Cornell Barman Primary Care Lacreshia Bondarenko: [name] Other Clinician: Referring Cristian Grieves: [name] Treating Reighn Kaplan/Extender: Tito Dine in Treatment: 8 Active Inactive ` Necrotic Tissue Nursing Diagnoses: Impaired tissue integrity related to necrotic/devitalized tissue Goals: Necrotic/devitalized tissue will be minimized in the wound bed Date Initiated: 12/01/2017 Target Resolution Date: 12/16/2017 Goal Status: Active Interventions: Assess patient pain level pre-, during and post procedure and prior to discharge Treatment Activities: Apply topical anesthetic as ordered : 12/01/2017 Notes: ` Orientation to the Wound Care Program Nursing Diagnoses: Knowledge deficit related to the wound healing center program Goals: Patient/caregiver will verbalize understanding of the [place] Date Initiated: 12/01/2017 Target Resolution Date: 12/31/2017 Goal Status: Active Interventions: Provide education on orientation to the wound center Notes: ` Wound/Skin Impairment Nursing Diagnoses: Impaired tissue integrity Goals: Patient/caregiver will verbalize understanding of skin care regimen Date Initiated: 12/01/2017 Target Resolution Date: 12/31/2017 [name], [name] ([id number]) Goal Status: Active Interventions: Assess ulceration(s) every visit Notes: Electronic Signature(s) Signed: 02/01/2018 5:21:06 PM By: Gretta Cool, BSN, RN, CWS, Kim RN, BSN Entered By: Gretta Cool, BSN, RN, CWS, Kim on 02/01/2018 14:57:00 [name] ([id number]) -------------------------------------------------------------------------------- Pain Assessment Details Patient Name: [name] Date of Service: 02/01/2018 2:30 PM Medical Record Number: [id number] Patient Account Number: [id number] Date of Birth/Sex: December 06, 1985 (32 y.o. F) Treating RN: Secundino Ginger Primary Care Margi Edmundson: [name] Other Clinician: Referring Iara Monds: [name] Treating Lindora Alviar/Extender: Tito Dine in Treatment: 8 Active Problems Location of Pain Severity and Description of Pain Patient Has Paino No Site Locations Pain Management and Medication Current Pain Management: Goals for Pain Management pt denies any pain at this time. Electronic Signature(s) Signed: 02/01/2018 4:20:57 PM By: Secundino Ginger Entered By: Secundino Ginger on  02/01/2018 14:39:30 [name], [name] ([id number]) -------------------------------------------------------------------------------- Patient/Caregiver Education Details Patient Name: [name] Date of Service: 02/01/2018 2:30 PM Medical Record Number: [id number] Patient Account Number: [id number] Date of Birth/Gender: 11-14-1985 (32 y.o. F) Treating RN: Cornell Barman Primary Care Physician: [name] Other Clinician: Referring Physician: [name] Treating Physician/Extender: Tito Dine in Treatment: 8 Education Assessment Education Provided To: Patient Education Topics Provided Engineer, maintenance) Signed: 02/01/2018 5:21:06 PM By: Gretta Cool, BSN, RN, CWS, Kim RN, BSN Entered By: Gretta Cool, BSN, RN, CWS, Kim on 02/01/2018 15:03:34 [name]  ([id number]) -------------------------------------------------------------------------------- Wound Assessment Details Patient Name: [name] Date of Service: 02/01/2018 2:30 PM Medical Record Number: [id number] Patient Account Number: [id number] Date of Birth/Sex: 12/25/1985 (32 y.o. F) Treating RN: Secundino Ginger Primary Care Niaya Hickok: [name] Other Clinician: Referring Wynonna Fitzhenry: [name] Treating Caston Coopersmith/Extender: Tito Dine in Treatment: 8 Wound Status Wound Number: 4 Primary Diabetic Wound/Ulcer of the Lower Extremity Etiology: Wound Location: Left Toe Great Wound Open Wounding Event: Gradually Appeared Status: Date Acquired: 11/07/2017 Comorbid Anemia, Hypertension, Type II Diabetes, Weeks Of Treatment: 8 History: Osteoarthritis, Neuropathy, Seizure Disorder Clustered Wound: No Pending Amputation On Presentation Photos Photo Uploaded By: Secundino Ginger on 02/01/2018 15:53:59 Wound Measurements Length: (cm) 0.3 Width: (cm) 0.6 Depth: (cm) 0.1 Area: (cm) 0.141 Volume: (cm) 0.014 % Reduction in Area: 94.8% % Reduction in Volume: 98.3% Epithelialization: None Tunneling: No Undermining: No Wound Description Classification: Grade 1 Foul Odo Wound Margin: Flat and Intact Slough/F Exudate Amount: None Present r After Cleansing: No ibrino Yes Wound Bed Granulation Amount: None Present (0%) Exposed Structure Necrotic Amount: None Present (0%) Fascia Exposed: No Fat Layer (Subcutaneous Tissue) Exposed: Yes Tendon Exposed: No Muscle Exposed: No Joint Exposed: No Bone Exposed: No Periwound Skin Texture Texture Color [name], [name]. ([id number]) No Abnormalities Noted: No No Abnormalities Noted: No Callus: No Atrophie Blanche: No Crepitus: No Cyanosis: No Excoriation: No Ecchymosis: No Induration: No Erythema: No Rash: No Hemosiderin Staining: No Scarring: Yes Mottled: No Pallor: No Moisture Rubor: No No Abnormalities Noted:  No Dry / Scaly: No Temperature / Pain Maceration: No Temperature: No Abnormality Wound Preparation Ulcer Cleansing: Rinsed/Irrigated with Saline Topical Anesthetic Applied: Other: lidocaine 4%, Treatment Notes Wound #4 (Left Toe Great) Notes silver alginate, bandaid Electronic Signature(s) Signed: 02/01/2018 4:20:57 PM By: Secundino Ginger Entered By: Secundino Ginger on 02/01/2018 14:44:56 [name] ([id number]) -------------------------------------------------------------------------------- Vitals Details Patient Name: [name] Date of Service: 02/01/2018 2:30 PM Medical Record Number: [id number] Patient Account Number: [id number] Date of Birth/Sex: 07/28/1985 (32 y.o. F) Treating RN: Secundino Ginger Primary Care Hamid Brookens: [name] Other Clinician: Referring Cumi Sanagustin: [name] Treating Berda Shelvin/Extender: Tito Dine in Treatment: 8 Vital Signs Time Taken: 14:40 Temperature (F): 98.4 Height (in): 66 Pulse (bpm): 97 Weight (lbs): 174 Respiratory Rate (breaths/min): 16 Body Mass Index (BMI): 28.1 Blood Pressure (mmHg): 151/96 Reference Range: 80 - 120 mg / dl Electronic Signature(s) Signed: 02/01/2018 4:20:57 PM By: Secundino Ginger Entered By: Secundino Ginger on 02/01/2018 14:41:52

## 2018-02-03 NOTE — Progress Notes (Signed)
Inpatient Diabetes Program Recommendations  AACE/ADA: New Consensus Statement on Inpatient Glycemic Control (2019)  Target Ranges:  Prepandial:   less than 140 mg/dL      Peak postprandial:   less than 180 mg/dL (1-2 hours)      Critically ill patients:  140 - 180 mg/dL   Results for [name], [name] (MRN [id number]) as of 02/03/2018 13:31  Ref. Range 02/02/2018 15:31 02/02/2018 20:38 02/03/2018 07:37 02/03/2018 11:16  Glucose-Capillary Latest Ref Range: 70 - 99 mg/dL 149 (H) 110 (H) 154 (H) 149 (H)  Results for [name], [name] (MRN [id number]) as of 02/03/2018 13:31  Ref. Range 10/24/2017 18:29 02/03/2018 04:47  Hemoglobin A1C Latest Ref Range: 4.8 - 5.6 % 8.3 (H) 10.9 (H)   Review of Glycemic Control  Diabetes history: DM2 Outpatient Diabetes medications: Tresiba 25 units QAM, Novolog 12-15 units 5-6 times per day Current orders for Inpatient glycemic control: Lantus 15 units QHS, Novolog 0-9 units TID with meals, Novolog 0-5 units QHS; Prednisone 10 mg QAM  Inpatient Diabetes Program Recommendations:  HgbA1C: A1C 10.9% on 02/03/18 indicating an average glucose of 266 mg/dl over the past 2-3 months. Prior A1C was 8.3% on 10/24/17.  NOTE: Spoke with patient about diabetes and home regimen for diabetes control. Patient reports that she is followed by Dr. Kasandra Knudsen for diabetes management and currently she takes Tresiba 25 units QAM and Novolog 12-15 units several times a day as an outpatient for diabetes control. Patient reports that she is taking insulin as prescribed. Patient reports that she was taking higher dose of Tresiba but Dr. Kasandra Knudsen decreased it to 25 units daily.  Patient states that she checks her glucose several times a day and it has been running high lately (reports as high as 500's mg/dl over the past 1 week).  Patient also reports that she has 2-3 hypoglycemic episodes per week which is why [name] has been adjusted.    Discussed A1C results (10.9% on 02/03/18) and explained that her  current A1C indicates an average glucose of 266 mg/dl over the past 2-3 months. Explained that prior A1C was 8.3% on 10/24/17 and inquired about why she felt her A1C has increased so much over the past few months. Did note that hemoglobin was 8.1 g/dL on 02/03/18 (was 10.5 g/dL on 10/24/17).   Discussed glucose and A1C goals. Discussed importance of checking CBGs and maintaining good CBG control to prevent long-term and short-term complications. Explained how hyperglycemia leads to damage within blood vessels which lead to the common complications seen with uncontrolled diabetes. Stressed to the patient the importance of improving glycemic control to prevent further complications from uncontrolled diabetes. Discussed impact of nutrition, exercise, stress, sickness, and medications on diabetes control. Encouraged patient to check her glucose at least 4 times per day (before meals and at bedtime) and to keep a log book of glucose readings which she will need to take to doctor appointments. Encouraged patient to follow up with Dr. Kasandra Knudsen regarding improving DM control.  Patient verbalized understanding of information discussed and she states that she has no further questions at this time related to diabetes. Agree with current inpatient orders for glycemic control.   Thanks, Barnie Alderman, RN, MSN, CDE Diabetes Coordinator Inpatient Diabetes Program [phone] (Team Pager)

## 2018-02-04 DIAGNOSIS — D631 Anemia in chronic kidney disease: Secondary | ICD-10-CM

## 2018-02-04 DIAGNOSIS — N184 Chronic kidney disease, stage 4 (severe): Secondary | ICD-10-CM

## 2018-02-04 DIAGNOSIS — N179 Acute kidney failure, unspecified: Secondary | ICD-10-CM

## 2018-02-04 DIAGNOSIS — N189 Chronic kidney disease, unspecified: Secondary | ICD-10-CM

## 2018-02-04 DIAGNOSIS — E876 Hypokalemia: Secondary | ICD-10-CM

## 2018-02-04 LAB — CBC
HEMATOCRIT: 24.3 % — AB (ref 36.0–46.0)
Hemoglobin: 7.3 g/dL — ABNORMAL LOW (ref 12.0–15.0)
MCH: 28.2 pg (ref 26.0–34.0)
MCHC: 30 g/dL (ref 30.0–36.0)
MCV: 93.8 fL (ref 80.0–100.0)
NRBC: 0 % (ref 0.0–0.2)
Platelets: 191 10*3/uL (ref 150–400)
RBC: 2.59 MIL/uL — ABNORMAL LOW (ref 3.87–5.11)
RDW: 15.2 % (ref 11.5–15.5)
WBC: 7.3 10*3/uL (ref 4.0–10.5)

## 2018-02-04 LAB — RENAL FUNCTION PANEL
ANION GAP: 7 (ref 5–15)
Albumin: 1.9 g/dL — ABNORMAL LOW (ref 3.5–5.0)
BUN: 64 mg/dL — ABNORMAL HIGH (ref 6–20)
CHLORIDE: 115 mmol/L — AB (ref 98–111)
CO2: 15 mmol/L — ABNORMAL LOW (ref 22–32)
Calcium: 5 mg/dL — CL (ref 8.9–10.3)
Creatinine, Ser: 3.95 mg/dL — ABNORMAL HIGH (ref 0.44–1.00)
GFR calc Af Amer: 16 mL/min — ABNORMAL LOW (ref >60)
GFR calc non Af Amer: 14 mL/min — ABNORMAL LOW (ref >60)
Glucose, Bld: 159 mg/dL — ABNORMAL HIGH (ref 70–99)
Phosphorus: 5.6 mg/dL — ABNORMAL HIGH (ref 2.5–4.6)
Potassium: 3.4 mmol/L — ABNORMAL LOW (ref 3.5–5.1)
Sodium: 137 mmol/L (ref 135–145)

## 2018-02-04 LAB — MAGNESIUM: Magnesium: 1.5 mg/dL — ABNORMAL LOW (ref 1.7–2.4)

## 2018-02-04 LAB — GLUCOSE, CAPILLARY
Glucose-Capillary: 181 mg/dL — ABNORMAL HIGH (ref 70–99)
Glucose-Capillary: 113 mg/dL — ABNORMAL HIGH (ref 70–99)
Glucose-Capillary: 176 mg/dL — ABNORMAL HIGH (ref 70–99)

## 2018-02-04 MED ORDER — OXYCODONE-ACETAMINOPHEN 5-325 MG PO TABS
1.0000 | ORAL_TABLET | Freq: Four times a day (QID) | ORAL | Status: DC | PRN
  Administered 2018-02-04 – 2018-02-05 (×3): 1 via ORAL
  Filled 2018-02-04 (×3): qty 1

## 2018-02-04 MED ORDER — CALCITRIOL 0.5 MCG PO CAPS
0.5000 ug | ORAL_CAPSULE | Freq: Every day | ORAL | Status: DC
  Administered 2018-02-04 – 2018-02-06 (×3): 0.5 ug via ORAL
  Filled 2018-02-04 (×3): qty 1

## 2018-02-04 MED ORDER — TORSEMIDE 20 MG PO TABS
80.0000 mg | ORAL_TABLET | Freq: Every day | ORAL | Status: DC
  Administered 2018-02-04 – 2018-02-06 (×3): 80 mg via ORAL
  Filled 2018-02-04 (×3): qty 4

## 2018-02-04 MED ORDER — CALCIUM CARBONATE 1250 (500 CA) MG PO TABS
1000.0000 mg | ORAL_TABLET | Freq: Three times a day (TID) | ORAL | Status: DC
  Administered 2018-02-04 – 2018-02-06 (×6): 1000 mg via ORAL
  Filled 2018-02-04 (×7): qty 1

## 2018-02-04 MED ORDER — MORPHINE SULFATE (PF) 2 MG/ML IV SOLN
1.0000 mg | INTRAVENOUS | Status: DC | PRN
  Administered 2018-02-04 – 2018-02-05 (×2): 1 mg via INTRAVENOUS
  Filled 2018-02-04 (×2): qty 1

## 2018-02-04 MED ORDER — CALCIUM GLUCONATE-NACL 2-0.675 GM/100ML-% IV SOLN
2.0000 g | Freq: Once | INTRAVENOUS | Status: AC
  Administered 2018-02-04: 2000 mg via INTRAVENOUS
  Filled 2018-02-04: qty 100

## 2018-02-04 MED ORDER — CALCIUM CARBONATE ANTACID 500 MG PO CHEW
800.0000 mg | CHEWABLE_TABLET | Freq: Every day | ORAL | Status: DC
  Administered 2018-02-04 – 2018-02-05 (×2): 800 mg via ORAL
  Filled 2018-02-04 (×2): qty 4

## 2018-02-04 MED ORDER — POTASSIUM CHLORIDE CRYS ER 20 MEQ PO TBCR
40.0000 meq | EXTENDED_RELEASE_TABLET | Freq: Once | ORAL | Status: AC
  Administered 2018-02-04: 40 meq via ORAL
  Filled 2018-02-04: qty 2

## 2018-02-04 MED ORDER — MAGNESIUM SULFATE 4 GM/100ML IV SOLN
4.0000 g | Freq: Once | INTRAVENOUS | Status: AC
  Administered 2018-02-04: 4 g via INTRAVENOUS
  Filled 2018-02-04: qty 100

## 2018-02-04 MED ORDER — SODIUM CHLORIDE 0.9 % IV SOLN
2.0000 g | Freq: Every day | INTRAVENOUS | Status: DC
  Administered 2018-02-04 – 2018-02-06 (×3): 2 g via INTRAVENOUS
  Filled 2018-02-04 (×3): qty 20

## 2018-02-04 MED ORDER — MAGNESIUM SULFATE 50 % IJ SOLN: 4.0000 g | Freq: Once | INTRAVENOUS | Status: DC

## 2018-02-04 MED ORDER — MAGNESIUM GLUCONATE 500 MG PO TABS
500.0000 mg | ORAL_TABLET | Freq: Two times a day (BID) | ORAL | Status: DC
  Administered 2018-02-04 – 2018-02-06 (×5): 500 mg via ORAL
  Filled 2018-02-04 (×7): qty 1

## 2018-02-04 NOTE — Progress Notes (Signed)
[facility]                                                                              Patient Demographics  [name], is a 32 y.o. female, DOB - [birth year], WUJ:[id number]  Admit date - 02/02/2018   Admitting Physician Albertine Patricia, MD  Outpatient Primary MD for the patient is [name], MD  Outpatient specialists:   LOS - 1  days   Medical records reviewed and are as summarized below:    Chief Complaint  Patient presents with  . Abdominal Pain       Brief summary   Patient is a 32 year old female with IDDM, gastroparesis, trisomy X, hypertension, ESRD status post renal transplant 2007, recent worsening renal function presented with abdominal pain nausea and vomiting for 24 hours prior to admission.  Stated could not keep anything down and resembled her previous gastroparesis, denied any fevers or chills.  No hematemesis.  Patient follows Dr. Lorrene Reid, baseline creatinine 1.9, recently was 3 as of 11/7.  Most recent renal biopsy 09/2017 which did show borderline cellular rejection on background of transplant.  Patient was found to have multiple electrolyte abnormalities at the time of admission with potassium 2.9 calcium 5.4, creatinine 3.96.  UA positive for UTI.   Assessment & Plan    Principal Problem:   Acute renal failure superimposed on stage 4 chronic kidney disease ([place]) -Status post renal transplant 2007, recent worsening renal function, most renal biopsy 09/2017 showed borderline cellular rejection on background of the transplant. -Nephrology following, per patient, nausea vomiting improving,, will follow recommendations -Follow urine culture, started on IV Rocephin for pyelonephritis  Active Problems: Intractable nausea, vomiting, abdominal pain, acute pyelonephritis -Patient has underlying gastroparesis, will continue Reglan -Nausea vomiting improving -CT abdomen/pelvis on 11/22 showed right pyelonephritis, no abscess -Follow urine  cultures, placed on IV Rocephin  Multiple electrolyte abnormalities including hypokalemia, hypomagnesemia, hypocalcemia -Placed on IV calcium and magnesium replacement, K-Dur 40 meq x 1  Anemia of chronic disease -H&H, trending down, 7.3, no bleeding -Patient receives Epogen every 2 weeks, last received on 01/26/2018.   Hypertension BP stable  Diabetes mellitus type 2, insulin-dependent -Continue Lantus 15 units at bedtime, sliding scale insulin  History of renal transplant Continue immunosuppressive regimen tacrolimus, Myfortic, prednisone  Anasarca Secondary to nephrotic range proteinuria, hypoalbuminemia, progressive CKD, started on torsemide today  Left toe wound Outpatient follow-up with Dr. Quentin Cornwall  Code Status: Full CODE STATUS DVT Prophylaxis:   SCD's Family Communication: Discussed in detail with the patient, all imaging results, lab results explained to the patient    Disposition Plan:  Time Spent in minutes  35  Procedures:  CT abdomen  Consultants:   Nephrology  Antimicrobials:   IV Rocephin 11/23>>   Medications  Scheduled Meds: . calcitRIOL  0.5 mcg Oral Daily  . calcium carbonate  1,000 mg of elemental calcium Oral TID WC  . calcium carbonate  800 mg of elemental calcium Oral QHS  . carvedilol  12.5 mg Oral BID WC  . cholecalciferol  1,000 Units Oral Daily  . famotidine  10 mg Oral Daily  . fluticasone  2  spray Each Nare Daily  . gabapentin  300 mg Oral QHS  . insulin aspart  0-5 Units Subcutaneous QHS  . insulin aspart  0-9 Units Subcutaneous TID WC  . insulin glargine  15 Units Subcutaneous QHS  . loratadine  10 mg Oral Daily  . magnesium gluconate  500 mg Oral BID  . metoCLOPramide (REGLAN) injection  10 mg Intravenous Q12H  . mycophenolate  720 mg Oral BID  . potassium chloride  20 mEq Oral BID  . predniSONE  10 mg Oral Q breakfast  . saccharomyces boulardii  250 mg Oral Daily  . simvastatin  20 mg Oral Q M,W,F  . sodium  bicarbonate  1,300 mg Oral TID  . tacrolimus  3 mg Oral BID  . torsemide  80 mg Oral Daily   Continuous Infusions: . cefTRIAXone (ROCEPHIN)  IV    . magnesium sulfate 1 - 4 g bolus IVPB 4 g (02/04/18 0701)   PRN Meds:.acetaminophen **OR** acetaminophen, hydrALAZINE, ondansetron **OR** ondansetron (ZOFRAN) IV, polyethylene glycol, promethazine, promethazine   Antibiotics   Anti-infectives (From admission, onward)   Start     Dose/Rate Route Frequency Ordered Stop   02/04/18 0700  cefTRIAXone (ROCEPHIN) 2 g in sodium chloride 0.9 % 100 mL IVPB     2 g 200 mL/hr over 30 Minutes Intravenous Daily 02/04/18 0625          Subjective:   [name] was seen and examined today.  Feeling somewhat better today, no fevers, no nausea vomiting and abdominal pain improving. Patient denies dizziness, chest pain, shortness of breath, new weakness, numbess, tingling. No acute events overnight.    Objective:   Vitals:   02/03/18 1656 02/03/18 2040 02/04/18 0527 02/04/18 0931  BP: 131/78 112/80 (!) 124/92 134/79  Pulse: 93 88 94 88  Resp: 14 18 16 18   Temp: 98.6 F (37 C) 98.2 F (36.8 C) 98.4 F (36.9 C) 98.5 F (36.9 C)  TempSrc: Oral Oral Oral Oral  SpO2: 99% 99% 100% 99%  Weight:      Height:        Intake/Output Summary (Last 24 hours) at 02/04/2018 1027 Last data filed at 02/04/2018 0640 Gross per 24 hour  Intake 2110 ml  Output 750 ml  Net 1360 ml     Wt Readings from Last 3 Encounters:  02/02/18 81 kg  12/15/17 78.9 kg  11/14/17 77.1 kg     Exam  General: Alert and oriented x 3, NAD  Eyes:   HEENT:    Cardiovascular: S1 S2 auscultated, Regular rate and rhythm.  Respiratory: Decreased breath sound at the bases  Gastrointestinal: Soft, nontender, nondistended, + bowel sounds  Ext:2+ pedal edema bilaterally  Neuro: No new deficits  Musculoskeletal: No digital cyanosis, clubbing  Skin: No rashes  Psych: Normal affect and demeanor, alert and oriented  x3    Data Reviewed:  I have personally reviewed following labs and imaging studies  Micro Results Recent Results (from the past 240 hour(s))  Urine culture     Status: Abnormal   Collection Time: 02/02/18  3:20 PM  Result Value Ref Range Status   Specimen Description URINE, RANDOM  Final   Special Requests   Final    NONE Performed at [facility], [place] [address] [place],  [address]    Culture (A)  Final    20,000 COLONIES/mL MULTIPLE SPECIES PRESENT, SUGGEST RECOLLECTION   Report Status 02/03/2018 FINAL  Final    Radiology Reports Ct  Abdomen Pelvis Wo Contrast  Result Date: 02/03/2018 CLINICAL DATA:  Complicated pyelonephritis. EXAM: CT ABDOMEN AND PELVIS WITHOUT CONTRAST TECHNIQUE: Multidetector CT imaging of the abdomen and pelvis was performed following the standard protocol without IV contrast. COMPARISON:  11/11/2017 FINDINGS: Lower chest: Mild dependent changes in the lung bases. Hepatobiliary: No focal liver abnormality is seen. Status post cholecystectomy. No biliary dilatation. Pancreas: Unremarkable. No pancreatic ductal dilatation or surrounding inflammatory changes. Spleen: Normal in size without focal abnormality. Adrenals/Urinary Tract: No adrenal gland nodules. Right kidney is absent, either congenital or surgically. Left kidney is extremely atrophic. 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 No hydronephrosis or hydroureter is seen. There is stranding in the fat around the pelvic right kidney and in the renal hilum which is compatible with pyelonephritis. Low-attenuation zone in the inferior kidney is unchanged since previous study probably representing a cyst although subcapsular collection could also possibly have this appearance. Bladder wall is mildly thickened which may indicate cystitis. No filling defects. Stomach/Bowel: Stomach, small bowel, and colon are mostly decompressed. Scattered stool in the colon. No wall thickening or inflammatory  changes. Appendix is normal. Vascular/Lymphatic: No significant vascular findings are present. No enlarged abdominal or pelvic lymph nodes. Reproductive: Status post hysterectomy. No adnexal masses. Other: No free air or free fluid in the abdomen. Abdominal wall musculature is intact. Mild subcutaneous edema. Musculoskeletal: No acute or significant osseous findings. IMPRESSION: 1. Right pelvic renal transplant kidney without hydronephrosis. Stranding in the fat around the pelvic right kidney and in the renal hilum consistent with pyelonephritis. 2. Low-attenuation zone in the inferior kidney is unchanged since previous study probably representing a cyst although subcapsular collection could also have this appearance. No definite abscess. 3. Mild bladder wall thickening may indicate cystitis. 4. No evidence of bowel obstruction or inflammation. Electronically Signed   By: Lucienne Capers M.D.   On: 02/03/2018 22:45   Dg Abdomen Acute W/chest  Result Date: 02/02/2018 CLINICAL DATA:  Chronic abdominal pain. EXAM: DG ABDOMEN ACUTE W/ 1V CHEST COMPARISON:  CT scan 10/21/2017 FINDINGS: The upright chest x-ray demonstrates low lung volumes with minimal streaky basilar atelectasis but no infiltrates, edema or effusions. Two views of the abdomen demonstrate a paucity of bowel gas. Minimal scattered air and stool in the left colon. No dilated loops of small bowel are demonstrated. The soft tissue shadows of the abdomen are maintained. No worrisome calcifications are identified. No free air. The bony structures are intact. IMPRESSION: No acute cardiopulmonary findings. No plain film findings for an acute abdominal process. Electronically Signed   By: Marijo Sanes M.D.   On: 02/02/2018 17:44    Lab Data:  CBC: Recent Labs  Lab 02/02/18 1400 02/03/18 0447 02/04/18 0446  WBC 12.0* 10.6* 7.3  NEUTROABS 8.0*  --   --   HGB 10.1* 8.1* 7.3*  HCT 32.3* 26.2* 24.3*  MCV 90.7 92.9 93.8  PLT 295 204 829   Basic  Metabolic Panel: Recent Labs  Lab 02/02/18 1400 02/02/18 1441 02/03/18 0447 02/03/18 1520 02/04/18 0446  NA 141  --  140 138 137  K 2.9*  --  2.9* 4.1 3.4*  CL 114*  --  118* 116* 115*  CO2 17*  --  15* 15* 15*  GLUCOSE 185*  --  73 130* 159*  BUN 81*  --  70* 67* 64*  CREATININE 3.96*  --  3.74* 3.75* 3.95*  CALCIUM 5.4*  --  4.9* 5.1* 5.0*  MG  --  1.2* 1.6*  --  1.5*  PHOS  --  5.5* 5.7*  --  5.6*   GFR: Estimated Creatinine Clearance: 21.5 mL/min (A) (by C-G formula based on SCr of 3.95 mg/dL (H)). Liver Function Tests: Recent Labs  Lab 02/02/18 1400 02/04/18 0446  AST 16  --   ALT 21  --   ALKPHOS 52  --   BILITOT 0.7  --   PROT 6.5  --   ALBUMIN 2.8* 1.9*   Recent Labs  Lab 02/02/18 1400  LIPASE 76*   No results for input(s): AMMONIA in the last 168 hours. Coagulation Profile: No results for input(s): INR, PROTIME in the last 168 hours. Cardiac Enzymes: No results for input(s): CKTOTAL, CKMB, CKMBINDEX, TROPONINI in the last 168 hours. BNP (last 3 results) No results for input(s): PROBNP in the last 8760 hours. HbA1C: Recent Labs    02/03/18 0447  HGBA1C 10.9*   CBG: Recent Labs  Lab 02/02/18 2038 02/03/18 0737 02/03/18 1116 02/03/18 1653 02/04/18 0719  GLUCAP 110* 154* 149* 215* 181*   Lipid Profile: No results for input(s): CHOL, HDL, LDLCALC, TRIG, CHOLHDL, LDLDIRECT in the last 72 hours. Thyroid Function Tests: No results for input(s): TSH, T4TOTAL, FREET4, T3FREE, THYROIDAB in the last 72 hours. Anemia Panel: No results for input(s): VITAMINB12, FOLATE, FERRITIN, TIBC, IRON, RETICCTPCT in the last 72 hours. Urine analysis:    Component Value Date/Time   COLORURINE STRAW (A) 02/02/2018 [place] 02/02/2018 1348   LABSPEC 1.011 02/02/2018 1348   PHURINE 7.0 02/02/2018 1348   GLUCOSEU 150 (A) 02/02/2018 1348   HGBUR NEGATIVE 02/02/2018 1348   HGBUR negative 01/26/2010 [place] 02/02/2018 1348    BILIRUBINUR NEG 01/01/2011 [place] 02/02/2018 1348   PROTEINUR >=300 (A) 02/02/2018 1348   UROBILINOGEN 0.2 01/09/2015 1333   NITRITE NEGATIVE 02/02/2018 1348   LEUKOCYTESUR NEGATIVE 02/02/2018 1348     Ripudeep [name] M.D. [facility] 02/04/2018, 10:27 AM  Pager: [phone] Between 7am to 7pm - call Pager - [phone]  After 7pm go to [email or web address] - password [email or web address]  Call night coverage person covering after 7pm

## 2018-02-04 NOTE — Progress Notes (Addendum)
Patient ID: [name], female   DOB: 10-22-1985, 32 y.o.   MRN: [id number] [facility] Progress Note   Assessment/ Plan:   1.  Acute kidney injury on chronic kidney disease stage IV T: Unfortunately with recent worsening allograft function in the setting of nephrotic range proteinuria with unsuccessful attempts at treating elements of rejection.  Current worsening renal function appears to be possibly hemodynamically mediated with recent adjustment of diuretic therapy to alleviate volume overload and thereafter GI symptoms limiting intake.  Discontinue intravenous fluids at this time and attempt to augment urine output with intravenous furosemide. 2.  Hypokalemia: With associated hypomagnesemia noted, continue oral replacement and will give magnesium intravenous/oral. 3.  Mixed gap/non-gap metabolic acidosis: Likely from her progressive chronic kidney disease and compounded by recent acute kidney injury-we will increase sodium bicarbonate to 1300 mg 3 times daily 4.  Abdominal pain/nausea/vomiting: Appears to be improving with symptomatic management-plausible that this might be gastroparesis however, urinalysis and CT scan both suggestive of allograft pyelonephritis that is being empirically treated with Rocephin as urine cultures pending.   5.  Anasarca: Secondary to nephrotic range proteinuria/progressive chronic kidney disease-restart furosemide 6.  Hypocalcemia: Increase calcitriol dose and continue supplemental calcium.  Check ionized calcium.  Subjective:   Reports to be feeling better and denies any new complaints-reports no additional abdominal pain/nausea/vomiting.   Objective:   BP (!) 124/92 (BP Location: Left Arm)   Pulse 94   Temp 98.4 F (36.9 C) (Oral)   Resp 16   Ht 5\' 6"  (1.676 m)   Wt 81 kg   SpO2 100%   BMI 28.81 kg/m   Intake/Output Summary (Last 24 hours) at 02/04/2018 2694 Last data filed at 02/04/2018 8546 Gross per 24 hour  Intake 2410 ml   Output 750 ml  Net 1660 ml   Weight change:   Physical Exam: Gen: Appears comfortable resting in bed, sleeping on her side CVS: Pulse regular rhythm, normal rate, S1 and S2 normal Resp: Diminished breath sounds over bases-poor inspiratory effort Abd: Soft, obese, non-tender Ext: 2-3+ lower extremity edema  Imaging: Ct Abdomen Pelvis Wo Contrast  Result Date: 02/03/2018 CLINICAL DATA:  Complicated pyelonephritis. EXAM: CT ABDOMEN AND PELVIS WITHOUT CONTRAST TECHNIQUE: Multidetector CT imaging of the abdomen and pelvis was performed following the standard protocol without IV contrast. COMPARISON:  11/11/2017 FINDINGS: Lower chest: Mild dependent changes in the lung bases. Hepatobiliary: No focal liver abnormality is seen. Status post cholecystectomy. No biliary dilatation. Pancreas: Unremarkable. No pancreatic ductal dilatation or surrounding inflammatory changes. Spleen: Normal in size without focal abnormality. Adrenals/Urinary Tract: No adrenal gland nodules. Right kidney is absent, either congenital or surgically. Left kidney is extremely atrophic. 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 No hydronephrosis or hydroureter is seen. There is stranding in the fat around the pelvic right kidney and in the renal hilum which is compatible with pyelonephritis. Low-attenuation zone in the inferior kidney is unchanged since previous study probably representing a cyst although subcapsular collection could also possibly have this appearance. Bladder wall is mildly thickened which may indicate cystitis. No filling defects. Stomach/Bowel: Stomach, small bowel, and colon are mostly decompressed. Scattered stool in the colon. No wall thickening or inflammatory changes. Appendix is normal. Vascular/Lymphatic: No significant vascular findings are present. No enlarged abdominal or pelvic lymph nodes. Reproductive: Status post hysterectomy. No adnexal masses. Other: No free air or free fluid in the abdomen.  Abdominal wall musculature is intact. Mild subcutaneous edema. Musculoskeletal: No acute or significant  osseous findings. IMPRESSION: 1. Right pelvic renal transplant kidney without hydronephrosis. Stranding in the fat around the pelvic right kidney and in the renal hilum consistent with pyelonephritis. 2. Low-attenuation zone in the inferior kidney is unchanged since previous study probably representing a cyst although subcapsular collection could also have this appearance. No definite abscess. 3. Mild bladder wall thickening may indicate cystitis. 4. No evidence of bowel obstruction or inflammation. Electronically Signed   By: Lucienne Capers M.D.   On: 02/03/2018 22:45   Dg Abdomen Acute W/chest  Result Date: 02/02/2018 CLINICAL DATA:  Chronic abdominal pain. EXAM: DG ABDOMEN ACUTE W/ 1V CHEST COMPARISON:  CT scan 10/21/2017 FINDINGS: The upright chest x-ray demonstrates low lung volumes with minimal streaky basilar atelectasis but no infiltrates, edema or effusions. Two views of the abdomen demonstrate a paucity of bowel gas. Minimal scattered air and stool in the left colon. No dilated loops of small bowel are demonstrated. The soft tissue shadows of the abdomen are maintained. No worrisome calcifications are identified. No free air. The bony structures are intact. IMPRESSION: No acute cardiopulmonary findings. No plain film findings for an acute abdominal process. Electronically Signed   By: Marijo Sanes M.D.   On: 02/02/2018 17:44    Labs: BMET Recent Labs  Lab 02/02/18 1400 02/02/18 1441 02/03/18 0447 02/03/18 1520 02/04/18 0446  NA 141  --  140 138 137  K 2.9*  --  2.9* 4.1 3.4*  CL 114*  --  118* 116* 115*  CO2 17*  --  15* 15* 15*  GLUCOSE 185*  --  73 130* 159*  BUN 81*  --  70* 67* 64*  CREATININE 3.96*  --  3.74* 3.75* 3.95*  CALCIUM 5.4*  --  4.9* 5.1* 5.0*  PHOS  --  5.5* 5.7*  --  5.6*   CBC Recent Labs  Lab 02/02/18 1400 02/03/18 0447 02/04/18 0446  WBC 12.0*  10.6* 7.3  NEUTROABS 8.0*  --   --   HGB 10.1* 8.1* 7.3*  HCT 32.3* 26.2* 24.3*  MCV 90.7 92.9 93.8  PLT 295 204 191    Medications:    . calcitRIOL  0.25 mcg Oral Daily  . calcium acetate  1,334 mg Oral TID WC  . calcium carbonate  2 tablet Oral BID  . carvedilol  12.5 mg Oral BID WC  . cholecalciferol  1,000 Units Oral Daily  . famotidine  10 mg Oral Daily  . fluticasone  2 spray Each Nare Daily  . gabapentin  300 mg Oral QHS  . insulin aspart  0-5 Units Subcutaneous QHS  . insulin aspart  0-9 Units Subcutaneous TID WC  . insulin glargine  15 Units Subcutaneous QHS  . loratadine  10 mg Oral Daily  . metoCLOPramide (REGLAN) injection  10 mg Intravenous Q12H  . mycophenolate  720 mg Oral BID  . potassium chloride  20 mEq Oral BID  . predniSONE  10 mg Oral Q breakfast  . saccharomyces boulardii  250 mg Oral Daily  . simvastatin  20 mg Oral Q M,W,F  . sodium bicarbonate  1,300 mg Oral TID  . tacrolimus  3 mg Oral BID   Elmarie Shiley, MD 02/04/2018, 9:24 AM

## 2018-02-05 LAB — RENAL FUNCTION PANEL
ALBUMIN: 2 g/dL — AB (ref 3.5–5.0)
Anion gap: 5 (ref 5–15)
BUN: 59 mg/dL — ABNORMAL HIGH (ref 6–20)
CHLORIDE: 114 mmol/L — AB (ref 98–111)
CO2: 19 mmol/L — ABNORMAL LOW (ref 22–32)
CREATININE: 3.93 mg/dL — AB (ref 0.44–1.00)
Calcium: 6.3 mg/dL — CL (ref 8.9–10.3)
GFR calc Af Amer: 16 mL/min — ABNORMAL LOW (ref >60)
GFR, EST NON AFRICAN AMERICAN: 14 mL/min — AB (ref >60)
Glucose, Bld: 228 mg/dL — ABNORMAL HIGH (ref 70–99)
Phosphorus: 6.4 mg/dL — ABNORMAL HIGH (ref 2.5–4.6)
Potassium: 3.9 mmol/L (ref 3.5–5.1)
Sodium: 138 mmol/L (ref 135–145)

## 2018-02-05 LAB — CBC
HCT: 24.1 % — ABNORMAL LOW (ref 36.0–46.0)
Hemoglobin: 7.2 g/dL — ABNORMAL LOW (ref 12.0–15.0)
MCH: 28 pg (ref 26.0–34.0)
MCHC: 29.9 g/dL — ABNORMAL LOW (ref 30.0–36.0)
MCV: 93.8 fL (ref 80.0–100.0)
NRBC: 0 % (ref 0.0–0.2)
PLATELETS: 164 10*3/uL (ref 150–400)
RBC: 2.57 MIL/uL — AB (ref 3.87–5.11)
RDW: 15.2 % (ref 11.5–15.5)
WBC: 6.4 10*3/uL (ref 4.0–10.5)

## 2018-02-05 LAB — MAGNESIUM: Magnesium: 2.2 mg/dL (ref 1.7–2.4)

## 2018-02-05 LAB — URINE CULTURE: Culture: 10000 — AB

## 2018-02-05 LAB — GLUCOSE, CAPILLARY
GLUCOSE-CAPILLARY: 210 mg/dL — AB (ref 70–99)
Glucose-Capillary: 186 mg/dL — ABNORMAL HIGH (ref 70–99)
Glucose-Capillary: 242 mg/dL — ABNORMAL HIGH (ref 70–99)
Glucose-Capillary: 232 mg/dL — ABNORMAL HIGH (ref 70–99)

## 2018-02-05 LAB — CALCIUM, IONIZED: CALCIUM, IONIZED, SERUM: 3.5 mg/dL — AB (ref 4.5–5.6)

## 2018-02-05 MED ORDER — CALCIUM GLUCONATE-NACL 2-0.675 GM/100ML-% IV SOLN
2.0000 g | Freq: Once | INTRAVENOUS | Status: AC
  Administered 2018-02-05: 2000 mg via INTRAVENOUS
  Filled 2018-02-05: qty 100

## 2018-02-05 MED ORDER — DARBEPOETIN ALFA 100 MCG/0.5ML IJ SOSY
100.0000 ug | PREFILLED_SYRINGE | Freq: Once | INTRAMUSCULAR | Status: AC
  Administered 2018-02-05: 100 ug via SUBCUTANEOUS
  Filled 2018-02-05: qty 0.5

## 2018-02-05 MED ORDER — SODIUM CHLORIDE 0.9 % IV SOLN
INTRAVENOUS | Status: DC | PRN
  Administered 2018-02-05: 250 mL via INTRAVENOUS

## 2018-02-05 NOTE — Progress Notes (Signed)
Patient ID: [name], female   DOB: [birth year], 32 y.o.   MRN: [id number] [facility] Progress Note   Assessment/ Plan:   1.  Acute kidney injury on chronic kidney disease stage IV T: Recently declining allograft function in the setting of nephrotic range proteinuria with unsuccessful attempts at treating elements of rejection.  Suspected to have hemodynamically mediated acute kidney injury in the setting of increased GI losses, likely allograft pyelonephritis and increased diuresis-transiently had diuretic holiday with intravenous fluids and now back on torsemide given extensive third spacing/edema.  No indications for dialysis yet. 2.  Hypokalemia/hypomagnesemia: Corrected with oral and intravenous replacement. 3.  Mixed gap/non-gap metabolic acidosis: Likely from her progressive chronic kidney disease and compounded by recent acute kidney injury-improving on sodium bicarbonate 4.  Abdominal pain/nausea/vomiting: Appears to be improving with symptomatic management-plausible that this might be gastroparesis however, urinalysis and CT scan both suggestive of allograft pyelonephritis that is being empirically treated with Rocephin-repeat urine culture negative to date.   5.  Anasarca: Secondary to nephrotic range proteinuria/progressive chronic kidney disease-with her hypoalbuminemia, I started her yesterday on torsemide 6.  Hypocalcemia: Increase calcitriol dose and continue supplemental calcium.  Check ionized calcium.  Subjective:   Reports to be feeling better and denies any new complaints-inquires about going home   Objective:   BP 107/74 (BP Location: Right Arm)   Pulse 91   Temp 98.1 F (36.7 C) (Oral)   Resp 20   Ht 5\' 6"  (1.676 m)   Wt 81 kg   SpO2 98%   BMI 28.82 kg/m   Intake/Output Summary (Last 24 hours) at 02/05/2018 1109 Last data filed at 02/05/2018 0700 Gross per 24 hour  Intake 960 ml  Output 1200 ml  Net -240 ml   Weight change:   Physical  Exam: Gen: Appears comfortable resting in bed, sleeping on her side CVS: Pulse regular rhythm, normal rate, S1 and S2 normal Resp: Diminished breath sounds over bases-poor inspiratory effort Abd: Soft, obese, non-tender Ext: 2-3+ lower extremity edema  Imaging: Ct Abdomen Pelvis Wo Contrast  Result Date: 02/03/2018 CLINICAL DATA:  Complicated pyelonephritis. EXAM: CT ABDOMEN AND PELVIS WITHOUT CONTRAST TECHNIQUE: Multidetector CT imaging of the abdomen and pelvis was performed following the standard protocol without IV contrast. COMPARISON:  11/11/2017 FINDINGS: Lower chest: Mild dependent changes in the lung bases. Hepatobiliary: No focal liver abnormality is seen. Status post cholecystectomy. No biliary dilatation. Pancreas: Unremarkable. No pancreatic ductal dilatation or surrounding inflammatory changes. Spleen: Normal in size without focal abnormality. Adrenals/Urinary Tract: No adrenal gland nodules. Right kidney is absent, either congenital or surgically. Left kidney is extremely atrophic. 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 No hydronephrosis or hydroureter is seen. There is stranding in the fat around the pelvic right kidney and in the renal hilum which is compatible with pyelonephritis. Low-attenuation zone in the inferior kidney is unchanged since previous study probably representing a cyst although subcapsular collection could also possibly have this appearance. Bladder wall is mildly thickened which may indicate cystitis. No filling defects. Stomach/Bowel: Stomach, small bowel, and colon are mostly decompressed. Scattered stool in the colon. No wall thickening or inflammatory changes. Appendix is normal. Vascular/Lymphatic: No significant vascular findings are present. No enlarged abdominal or pelvic lymph nodes. Reproductive: Status post hysterectomy. No adnexal masses. Other: No free air or free fluid in the abdomen. Abdominal wall musculature is intact. Mild subcutaneous edema.  Musculoskeletal: No acute or significant osseous findings. IMPRESSION: 1. Right pelvic renal transplant kidney  without hydronephrosis. Stranding in the fat around the pelvic right kidney and in the renal hilum consistent with pyelonephritis. 2. Low-attenuation zone in the inferior kidney is unchanged since previous study probably representing a cyst although subcapsular collection could also have this appearance. No definite abscess. 3. Mild bladder wall thickening may indicate cystitis. 4. No evidence of bowel obstruction or inflammation. Electronically Signed   By: Lucienne Capers M.D.   On: 02/03/2018 22:45    Labs: BMET Recent Labs  Lab 02/02/18 1400 02/02/18 1441 02/03/18 0447 02/03/18 1520 02/04/18 0446 02/05/18 0623  NA 141  --  140 138 137 138  K 2.9*  --  2.9* 4.1 3.4* 3.9  CL 114*  --  118* 116* 115* 114*  CO2 17*  --  15* 15* 15* 19*  GLUCOSE 185*  --  73 130* 159* 228*  BUN 81*  --  70* 67* 64* 59*  CREATININE 3.96*  --  3.74* 3.75* 3.95* 3.93*  CALCIUM 5.4*  --  4.9* 5.1* 5.0* 6.3*  PHOS  --  5.5* 5.7*  --  5.6* 6.4*   CBC Recent Labs  Lab 02/02/18 1400 02/03/18 0447 02/04/18 0446 02/05/18 0623  WBC 12.0* 10.6* 7.3 6.4  NEUTROABS 8.0*  --   --   --   HGB 10.1* 8.1* 7.3* 7.2*  HCT 32.3* 26.2* 24.3* 24.1*  MCV 90.7 92.9 93.8 93.8  PLT 295 204 191 164    Medications:    . calcitRIOL  0.5 mcg Oral Daily  . calcium carbonate  1,000 mg of elemental calcium Oral TID WC  . calcium carbonate  800 mg of elemental calcium Oral QHS  . carvedilol  12.5 mg Oral BID WC  . cholecalciferol  1,000 Units Oral Daily  . famotidine  10 mg Oral Daily  . fluticasone  2 spray Each Nare Daily  . gabapentin  300 mg Oral QHS  . insulin aspart  0-5 Units Subcutaneous QHS  . insulin aspart  0-9 Units Subcutaneous TID WC  . insulin glargine  15 Units Subcutaneous QHS  . loratadine  10 mg Oral Daily  . magnesium gluconate  500 mg Oral BID  . metoCLOPramide (REGLAN) injection  10 mg  Intravenous Q12H  . mycophenolate  720 mg Oral BID  . predniSONE  10 mg Oral Q breakfast  . saccharomyces boulardii  250 mg Oral Daily  . simvastatin  20 mg Oral Q M,W,F  . sodium bicarbonate  1,300 mg Oral TID  . tacrolimus  3 mg Oral BID  . torsemide  80 mg Oral Daily   Elmarie Shiley, MD 02/05/2018, 11:09 AM

## 2018-02-05 NOTE — Progress Notes (Signed)
[facility]                                                                              Patient Demographics  [name], is a 32 y.o. female, DOB - 1985-12-20, RXV:[id number]  Admit date - 02/02/2018   Admitting Physician Albertine Patricia, MD  Outpatient Primary MD for the patient is [name], MD  Outpatient specialists:   LOS - 2  days   Medical records reviewed and are as summarized below:    Chief Complaint  Patient presents with  . Abdominal Pain       Brief summary   Patient is a 32 year old female with IDDM, gastroparesis, trisomy X, hypertension, ESRD status post renal transplant 2007, recent worsening renal function presented with abdominal pain nausea and vomiting for 24 hours prior to admission.  Stated could not keep anything down and resembled her previous gastroparesis, denied any fevers or chills.  No hematemesis.  Patient follows Dr. Lorrene Reid, baseline creatinine 1.9, recently was 3 as of 11/7.  Most recent renal biopsy 09/2017 which did show borderline cellular rejection on background of transplant.  Patient was found to have multiple electrolyte abnormalities at the time of admission with potassium 2.9 calcium 5.4, creatinine 3.96.  UA positive for UTI.   Assessment & Plan    Principal Problem:   Acute renal failure superimposed on stage 4 chronic kidney disease ([place]) -Status post renal transplant 2007, recent worsening renal function, most renal biopsy 09/2017 showed borderline cellular rejection on background of the transplant. -Started on torsemide, creatinine stable at 3.93, 3.95 yesterday  Active Problems: Intractable nausea, vomiting, abdominal pain, acute pyelonephritis -Patient has underlying gastroparesis, will continue Reglan -Nausea vomiting improving -CT abdomen/pelvis on 11/22 showed right pyelonephritis, no abscess -Urine culture has been negative so far however patient had remarkable improvement after starting IV  antibiotics with no abdominal pain nausea vomiting.   -will continue IV Rocephin today.  Transition to oral Ceftin at the discharge for 10 days.  Multiple electrolyte abnormalities including hypokalemia, hypomagnesemia, hypocalcemia -Corrected calcium 7.9, will give 1 more dose of IV gluconate, continue calcium supplementation  Anemia of chronic disease -H&H, trending down, 7.3, no bleeding -Patient receives Epogen every 2 weeks, last received on 01/26/2018.  -Will give Aranesp 100 mcg subcu x1  Hypertension BP stable  Diabetes mellitus type 2, insulin-dependent -Continue Lantus 15 units at bedtime, sliding scale insulin   History of renal transplant Continue immunosuppressive regimen tacrolimus, Myfortic, prednisone  Anasarca Secondary to nephrotic range proteinuria, hypoalbuminemia, progressive CKD, started on torsemide   Left toe wound Outpatient follow-up with Dr. Quentin Cornwall  Code Status: Full CODE STATUS DVT Prophylaxis:   SCD's Family Communication: Discussed in detail with the patient, all imaging results, lab results explained to the patient    Disposition Plan: Hopefully DC home in a.m.  Time Spent in minutes  35  Procedures:  CT abdomen  Consultants:   Nephrology  Antimicrobials:   IV Rocephin 11/23>>   Medications  Scheduled Meds: . calcitRIOL  0.5 mcg Oral Daily  . calcium carbonate  1,000 mg of elemental calcium Oral TID WC  . calcium carbonate  800 mg of elemental calcium Oral QHS  . carvedilol  12.5 mg Oral BID WC  . cholecalciferol  1,000 Units Oral Daily  . famotidine  10 mg Oral Daily  . fluticasone  2 spray Each Nare Daily  . gabapentin  300 mg Oral QHS  . insulin aspart  0-5 Units Subcutaneous QHS  . insulin aspart  0-9 Units Subcutaneous TID WC  . insulin glargine  15 Units Subcutaneous QHS  . loratadine  10 mg Oral Daily  . magnesium gluconate  500 mg Oral BID  . metoCLOPramide (REGLAN) injection  10 mg Intravenous Q12H  .  mycophenolate  720 mg Oral BID  . predniSONE  10 mg Oral Q breakfast  . saccharomyces boulardii  250 mg Oral Daily  . simvastatin  20 mg Oral Q M,W,F  . sodium bicarbonate  1,300 mg Oral TID  . tacrolimus  3 mg Oral BID  . torsemide  80 mg Oral Daily   Continuous Infusions: . sodium chloride 250 mL (02/05/18 1005)  . calcium gluconate 2,000 mg (02/05/18 1122)  . cefTRIAXone (ROCEPHIN)  IV 2 g (02/05/18 1006)   PRN Meds:.sodium chloride, acetaminophen **OR** acetaminophen, hydrALAZINE, morphine injection, ondansetron **OR** ondansetron (ZOFRAN) IV, oxyCODONE-acetaminophen, polyethylene glycol, promethazine, promethazine   Antibiotics   Anti-infectives (From admission, onward)   Start     Dose/Rate Route Frequency Ordered Stop   02/04/18 0700  cefTRIAXone (ROCEPHIN) 2 g in sodium chloride 0.9 % 100 mL IVPB     2 g 200 mL/hr over 30 Minutes Intravenous Daily 02/04/18 0625          Subjective:   Shanoah Asbill was seen and examined today.  Asking when she can go home.  Overall feeling better, no fever chills nausea vomiting, abdominal pain improving.  No acute events overnight.     Objective:   Vitals:   02/04/18 0931 02/04/18 2141 02/05/18 0455 02/05/18 0950  BP: 134/79 (!) 141/97 105/76 107/74  Pulse: 88 90 87 91  Resp: 18 18 18 20   Temp: 98.5 F (36.9 C) 98.2 F (36.8 C) 98.4 F (36.9 C) 98.1 F (36.7 C)  TempSrc: Oral Oral  Oral  SpO2: 99% 97% 98% 98%  Weight:  81 kg    Height:        Intake/Output Summary (Last 24 hours) at 02/05/2018 1154 Last data filed at 02/05/2018 0700 Gross per 24 hour  Intake 960 ml  Output 1200 ml  Net -240 ml     Wt Readings from Last 3 Encounters:  02/04/18 81 kg  12/15/17 78.9 kg  11/14/17 77.1 kg   Physical Exam  General: Alert and oriented x 3, NAD  Eyes:   HEENT:  Atraumatic, normocephalic  Cardiovascular: S1 S2 auscultated, RRR.  2+  pedal edema b/l  Respiratory: Clear to auscultation bilaterally, no wheezing,  rales or rhonchi  Gastrointestinal: Soft, nontender, nondistended, + bowel sounds  Ext: 2+ pedal edema bilaterally  Neuro: No new deficits  Musculoskeletal: No digital cyanosis, clubbing  Skin: No rashes  Psych: Normal affect and demeanor, alert and oriented x3         Data Reviewed:  I have personally reviewed following labs and imaging studies  Micro Results Recent Results (from the past 240 hour(s))  Urine culture     Status: Abnormal   Collection Time: 02/02/18  3:20 PM  Result Value Ref Range Status   Specimen Description URINE, RANDOM  Final   Special Requests   Final  NONE Performed at [facility], [place] [address] [place], [place] [address]    Culture (A)  Final    20,000 COLONIES/mL MULTIPLE SPECIES PRESENT, SUGGEST RECOLLECTION   Report Status 02/03/2018 FINAL  Final  Urine Culture     Status: Abnormal   Collection Time: 02/04/18  8:02 AM  Result Value Ref Range Status   Specimen Description URINE, CLEAN CATCH  Final   Special Requests   Final    NONE Performed at [facility], [place] [address] [place], [place] [address]    Culture (A)  Final    10,000 COLONIES/mL MULTIPLE SPECIES PRESENT, SUGGEST RECOLLECTION   Report Status 02/05/2018 FINAL  Final    Radiology Reports Ct Abdomen Pelvis Wo Contrast  Result Date: 02/03/2018 CLINICAL DATA:  Complicated pyelonephritis. EXAM: CT ABDOMEN AND PELVIS WITHOUT CONTRAST TECHNIQUE: Multidetector CT imaging of the abdomen and pelvis was performed following the standard protocol without IV contrast. COMPARISON:  11/11/2017 FINDINGS: Lower chest: Mild dependent changes in the lung bases. Hepatobiliary: No focal liver abnormality is seen. Status post cholecystectomy. No biliary dilatation. Pancreas: Unremarkable. No pancreatic ductal dilatation or surrounding inflammatory changes. Spleen: Normal in size without focal abnormality. Adrenals/Urinary Tract: No adrenal gland nodules. Right kidney is absent,  either congenital or surgically. Left kidney is extremely atrophic. 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 No hydronephrosis or hydroureter is seen. There is stranding in the fat around the pelvic right kidney and in the renal hilum which is compatible with pyelonephritis. Low-attenuation zone in the inferior kidney is unchanged since previous study probably representing a cyst although subcapsular collection could also possibly have this appearance. Bladder wall is mildly thickened which may indicate cystitis. No filling defects. Stomach/Bowel: Stomach, small bowel, and colon are mostly decompressed. Scattered stool in the colon. No wall thickening or inflammatory changes. Appendix is normal. Vascular/Lymphatic: No significant vascular findings are present. No enlarged abdominal or pelvic lymph nodes. Reproductive: Status post hysterectomy. No adnexal masses. Other: No free air or free fluid in the abdomen. Abdominal wall musculature is intact. Mild subcutaneous edema. Musculoskeletal: No acute or significant osseous findings. IMPRESSION: 1. Right pelvic renal transplant kidney without hydronephrosis. Stranding in the fat around the pelvic right kidney and in the renal hilum consistent with pyelonephritis. 2. Low-attenuation zone in the inferior kidney is unchanged since previous study probably representing a cyst although subcapsular collection could also have this appearance. No definite abscess. 3. Mild bladder wall thickening may indicate cystitis. 4. No evidence of bowel obstruction or inflammation. Electronically Signed   By: Lucienne Capers M.D.   On: 02/03/2018 22:45   Dg Abdomen Acute W/chest  Result Date: 02/02/2018 CLINICAL DATA:  Chronic abdominal pain. EXAM: DG ABDOMEN ACUTE W/ 1V CHEST COMPARISON:  CT scan 10/21/2017 FINDINGS: The upright chest x-ray demonstrates low lung volumes with minimal streaky basilar atelectasis but no infiltrates, edema or effusions. Two views of the abdomen  demonstrate a paucity of bowel gas. Minimal scattered air and stool in the left colon. No dilated loops of small bowel are demonstrated. The soft tissue shadows of the abdomen are maintained. No worrisome calcifications are identified. No free air. The bony structures are intact. IMPRESSION: No acute cardiopulmonary findings. No plain film findings for an acute abdominal process. Electronically Signed   By: Marijo Sanes M.D.   On: 02/02/2018 17:44    Lab Data:  CBC: Recent Labs  Lab 02/02/18 1400 02/03/18 0447 02/04/18 0446 02/05/18 0623  WBC 12.0* 10.6*  7.3 6.4  NEUTROABS 8.0*  --   --   --   HGB 10.1* 8.1* 7.3* 7.2*  HCT 32.3* 26.2* 24.3* 24.1*  MCV 90.7 92.9 93.8 93.8  PLT 295 204 191 116   Basic Metabolic Panel: Recent Labs  Lab 02/02/18 1400 02/02/18 1441 02/03/18 0447 02/03/18 1520 02/04/18 0446 02/05/18 0623  NA 141  --  140 138 137 138  K 2.9*  --  2.9* 4.1 3.4* 3.9  CL 114*  --  118* 116* 115* 114*  CO2 17*  --  15* 15* 15* 19*  GLUCOSE 185*  --  73 130* 159* 228*  BUN 81*  --  70* 67* 64* 59*  CREATININE 3.96*  --  3.74* 3.75* 3.95* 3.93*  CALCIUM 5.4*  --  4.9* 5.1* 5.0* 6.3*  MG  --  1.2* 1.6*  --  1.5* 2.2  PHOS  --  5.5* 5.7*  --  5.6* 6.4*   GFR: Estimated Creatinine Clearance: 21.7 mL/min (A) (by C-G formula based on SCr of 3.93 mg/dL (H)). Liver Function Tests: Recent Labs  Lab 02/02/18 1400 02/04/18 0446 02/05/18 0623  AST 16  --   --   ALT 21  --   --   ALKPHOS 52  --   --   BILITOT 0.7  --   --   PROT 6.5  --   --   ALBUMIN 2.8* 1.9* 2.0*   Recent Labs  Lab 02/02/18 1400  LIPASE 76*   No results for input(s): AMMONIA in the last 168 hours. Coagulation Profile: No results for input(s): INR, PROTIME in the last 168 hours. Cardiac Enzymes: No results for input(s): CKTOTAL, CKMB, CKMBINDEX, TROPONINI in the last 168 hours. BNP (last 3 results) No results for input(s): PROBNP in the last 8760 hours. HbA1C: Recent Labs     02/03/18 0447  HGBA1C 10.9*   CBG: Recent Labs  Lab 02/03/18 1653 02/04/18 0719 02/04/18 1151 02/04/18 1605 02/05/18 0726  GLUCAP 215* 181* 113* 176* 210*   Lipid Profile: No results for input(s): CHOL, HDL, LDLCALC, TRIG, CHOLHDL, LDLDIRECT in the last 72 hours. Thyroid Function Tests: No results for input(s): TSH, T4TOTAL, FREET4, T3FREE, THYROIDAB in the last 72 hours. Anemia Panel: No results for input(s): VITAMINB12, FOLATE, FERRITIN, TIBC, IRON, RETICCTPCT in the last 72 hours. Urine analysis:    Component Value Date/Time   COLORURINE STRAW (A) 02/02/2018 [place] 02/02/2018 1348   LABSPEC 1.011 02/02/2018 1348   PHURINE 7.0 02/02/2018 1348   GLUCOSEU 150 (A) 02/02/2018 1348   HGBUR NEGATIVE 02/02/2018 1348   HGBUR negative 01/26/2010 [place] 02/02/2018 1348   BILIRUBINUR NEG 01/01/2011 [place] 02/02/2018 1348   PROTEINUR >=300 (A) 02/02/2018 1348   UROBILINOGEN 0.2 01/09/2015 1333   NITRITE NEGATIVE 02/02/2018 1348   LEUKOCYTESUR NEGATIVE 02/02/2018 1348     Ripudeep [name] M.D. [facility] 02/05/2018, 11:54 AM  Pager: [phone] Between 7am to 7pm - call Pager - [phone]  After 7pm go to [email or web address] - password [email or web address]  Call night coverage person covering after 7pm

## 2018-02-06 ENCOUNTER — Encounter (HOSPITAL_COMMUNITY): Payer: Medicare Other

## 2018-02-06 DIAGNOSIS — N183 Chronic kidney disease, stage 3 (moderate): Secondary | ICD-10-CM

## 2018-02-06 LAB — RENAL FUNCTION PANEL
ALBUMIN: 2.1 g/dL — AB (ref 3.5–5.0)
ANION GAP: 8 (ref 5–15)
BUN: 54 mg/dL — AB (ref 6–20)
CALCIUM: 7 mg/dL — AB (ref 8.9–10.3)
CO2: 20 mmol/L — ABNORMAL LOW (ref 22–32)
Chloride: 110 mmol/L (ref 98–111)
Creatinine, Ser: 3.95 mg/dL — ABNORMAL HIGH (ref 0.44–1.00)
GFR calc Af Amer: 16 mL/min — ABNORMAL LOW (ref >60)
GFR calc non Af Amer: 14 mL/min — ABNORMAL LOW (ref >60)
GLUCOSE: 164 mg/dL — AB (ref 70–99)
PHOSPHORUS: 6.3 mg/dL — AB (ref 2.5–4.6)
Potassium: 3.6 mmol/L (ref 3.5–5.1)
SODIUM: 138 mmol/L (ref 135–145)

## 2018-02-06 LAB — CBC
HCT: 25.5 % — ABNORMAL LOW (ref 36.0–46.0)
Hemoglobin: 7.9 g/dL — ABNORMAL LOW (ref 12.0–15.0)
MCH: 28.7 pg (ref 26.0–34.0)
MCHC: 31 g/dL (ref 30.0–36.0)
MCV: 92.7 fL (ref 80.0–100.0)
NRBC: 0 % (ref 0.0–0.2)
PLATELETS: 157 10*3/uL (ref 150–400)
RBC: 2.75 MIL/uL — ABNORMAL LOW (ref 3.87–5.11)
RDW: 15.2 % (ref 11.5–15.5)
WBC: 6.2 10*3/uL (ref 4.0–10.5)

## 2018-02-06 LAB — GLUCOSE, CAPILLARY
Glucose-Capillary: 230 mg/dL — ABNORMAL HIGH (ref 70–99)
Glucose-Capillary: 206 mg/dL — ABNORMAL HIGH (ref 70–99)
Glucose-Capillary: 298 mg/dL — ABNORMAL HIGH (ref 70–99)

## 2018-02-06 LAB — MAGNESIUM: Magnesium: 1.7 mg/dL (ref 1.7–2.4)

## 2018-02-06 MED ORDER — HYDROXYZINE HCL 50 MG PO TABS: 50.0000 mg | ORAL_TABLET | Freq: Two times a day (BID) | ORAL | 0 refills | Status: DC | PRN

## 2018-02-06 MED ORDER — CEFUROXIME AXETIL 500 MG PO TABS: 500.0000 mg | ORAL_TABLET | Freq: Two times a day (BID) | ORAL | 0 refills | Status: DC

## 2018-02-06 MED ORDER — TORSEMIDE 20 MG PO TABS: 80.0000 mg | ORAL_TABLET | Freq: Every day | ORAL | 2 refills | Status: DC

## 2018-02-06 MED ORDER — METOCLOPRAMIDE HCL 10 MG PO TABS: 10.0000 mg | ORAL_TABLET | Freq: Three times a day (TID) | ORAL | 3 refills | Status: DC

## 2018-02-06 MED ORDER — CEFUROXIME AXETIL 500 MG PO TABS: 500.0000 mg | ORAL_TABLET | Freq: Every day | ORAL | 0 refills | Status: AC

## 2018-02-06 MED ORDER — DICYCLOMINE HCL 20 MG PO TABS: 20.0000 mg | ORAL_TABLET | Freq: Two times a day (BID) | ORAL | 0 refills | Status: DC

## 2018-02-06 MED ORDER — PREDNISONE 10 MG PO TABS: 10.0000 mg | ORAL_TABLET | ORAL | 3 refills | Status: DC

## 2018-02-06 MED ORDER — PROMETHAZINE HCL 25 MG PO TABS: 25.0000 mg | ORAL_TABLET | Freq: Four times a day (QID) | ORAL | 0 refills | Status: DC | PRN

## 2018-02-06 NOTE — Discharge Summary (Signed)
Physician Discharge Summary   Patient ID: NEKITA PITA MRN: [id number] DOB/AGE: 07/04/1985 32 y.o.  Admit date: 02/02/2018 Discharge date: 02/06/2018  Primary Care Physician:  [name], MD   Recommendations for Outpatient Follow-up:  1. Follow up with PCP in 1-2 weeks 2. Follow BMET in 1 week  Home Health: None  Equipment/Devices:   Discharge Condition: stable  CODE STATUS: FULL  Diet recommendation: Carb modified diet   Discharge Diagnoses:   Right-sided pyelonephritis . Anemia due to chronic kidney disease . Essential hypertension, benign . Gastroparesis . Immunosuppressed status ([place]) . Nausea & vomiting possibly due to gastroparesis . Type 1 diabetes mellitus with complication, uncontrolled ([place]) . AKI (acute kidney injury) ([place]) . Metabolic acidosis . Hypokalemia . Incontinence of bowel . Acute renal failure superimposed on stage 4 chronic kidney disease ([place])   Consults: Nephrology    Allergies:   Allergies  Allergen Reactions  . Benadryl [Diphenhydramine-Zinc Acetate] Shortness Of Breath  . Motrin [Ibuprofen] Shortness Of Breath and Itching    Per pt  . Banana Other (See Comments)    Sick on the stomach  . Diphenhydramine Hcl     REACTION: Stopped breathing in ICU  . Doxycycline     Shortness of Breath   . Iron Dextran     REACTION: vein irritation  . Shellfish Allergy Hives     DISCHARGE MEDICATIONS: Allergies as of 02/06/2018      Reactions   Benadryl [diphenhydramine-zinc Acetate] Shortness Of Breath   Motrin [ibuprofen] Shortness Of Breath, Itching   Per pt   Banana Other (See Comments)   Sick on the stomach   Diphenhydramine Hcl    REACTION: Stopped breathing in ICU   Doxycycline    Shortness of Breath    Iron Dextran    REACTION: vein irritation   Shellfish Allergy Hives      Medication List    STOP taking these medications   furosemide 80 MG tablet Commonly known as:  LASIX     TAKE these medications    ACCU-CHEK SOFTCLIX LANCETS lancets Use to check blood sugar 4 times per day.   acetaminophen 500 MG tablet Commonly known as:  TYLENOL Take 1,000 mg by mouth daily as needed for moderate pain.   calcitRIOL 0.25 MCG capsule Commonly known as:  ROCALTROL Take 0.25 mcg by mouth daily.   calcium acetate 667 MG capsule Commonly known as:  PHOSLO Take 1,334 mg by mouth 3 (three) times daily with meals.   calcium elemental as carbonate 400 MG chewable tablet Commonly known as:  BARIATRIC TUMS ULTRA Chew 1,000 mg by mouth 3 (three) times daily as needed for heartburn.   carvedilol 12.5 MG tablet Commonly known as:  COREG Take 12.5 mg by mouth 2 (two) times daily with a meal.   cefUROXime 500 MG tablet Commonly known as:  CEFTIN Take 1 tablet (500 mg total) by mouth daily for 10 days.   cetirizine 10 MG tablet Commonly known as:  ZYRTEC Take 10 mg by mouth daily as needed for allergies.   cyclobenzaprine 10 MG tablet Commonly known as:  FLEXERIL Take 10 mg by mouth 2 (two) times daily as needed (pain).   dicyclomine 20 MG tablet Commonly known as:  BENTYL Take 1 tablet (20 mg total) by mouth 2 (two) times daily.   famotidine 10 MG tablet Commonly known as:  PEPCID Take 10 mg by mouth daily. What changed:  Another medication with the same name was removed. Continue taking this  medication, and follow the directions you see here.   fluticasone 50 MCG/ACT nasal spray Commonly known as:  FLONASE Place 2 sprays into both nostrils daily. What changed:    when to take this  reasons to take this   gabapentin 300 MG capsule Commonly known as:  NEURONTIN Take 300 mg by mouth 3 (three) times daily.   hydrOXYzine 50 MG tablet Commonly known as:  ATARAX/VISTARIL Take 1 tablet (50 mg total) by mouth 2 (two) times daily as needed for itching.   insulin degludec 100 UNIT/ML Sopn FlexTouch Pen Commonly known as:  TRESIBA Inject 0.2 mLs (20 Units total) into the skin at  bedtime. What changed:    how much to take  when to take this  additional instructions   loperamide 2 MG capsule Commonly known as:  IMODIUM Take 2 mg by mouth as needed for diarrhea or loose stools.   Magnesium 400 MG Tabs Take 400 mg by mouth 2 (two) times daily.   metoCLOPramide 10 MG tablet Commonly known as:  REGLAN Take 1 tablet (10 mg total) by mouth 3 (three) times daily before meals. What changed:    when to take this  reasons to take this   mycophenolate 360 MG Tbec EC tablet Commonly known as:  MYFORTIC Take 720 mg by mouth 2 (two) times daily.   NOVOLOG FLEXPEN 100 UNIT/ML FlexPen Generic drug:  insulin aspart Inject 12-15 Units into the skin See admin instructions. 5-6 times per day as needed   ondansetron 4 MG disintegrating tablet Commonly known as:  ZOFRAN-ODT Take 1 tablet (4 mg total) by mouth every 4 (four) hours as needed for nausea or vomiting.   predniSONE 10 MG tablet Commonly known as:  DELTASONE Take 1 tablet (10 mg total) by mouth every morning.   promethazine 25 MG tablet Commonly known as:  PHENERGAN Take 1 tablet (25 mg total) by mouth every 6 (six) hours as needed for nausea or vomiting.   simvastatin 20 MG tablet Commonly known as:  ZOCOR Take 20 mg by mouth at bedtime.   sodium bicarbonate 650 MG tablet Take 650 mg by mouth 3 (three) times daily.   tacrolimus 1 MG capsule Commonly known as:  PROGRAF Take 3 mg by mouth 2 (two) times daily.   torsemide 20 MG tablet Commonly known as:  DEMADEX Take 4 tablets (80 mg total) by mouth daily. Start taking on:  02/07/2018   VITAMIN D3 PO Take 1 capsule by mouth as needed (supplement).        Brief H and P: For complete details please refer to admission H and P, but in briefPatient is a 32 year old female with IDDM, gastroparesis, trisomy X, hypertension, ESRD status post renal transplant 2007, recent worsening renal function presented with abdominal pain nausea and vomiting  for 24 hours prior to admission.  Stated could not keep anything down and resembled her previous gastroparesis, denied any fevers or chills.  No hematemesis.  Patient follows Dr. Lorrene Reid, baseline creatinine 1.9, recently was 3 as of 11/7.  Most recent renal biopsy 09/2017 which did show borderline cellular rejection on background of transplant.  Patient was found to have multiple electrolyte abnormalities at the time of admission with potassium 2.9 calcium 5.4, creatinine 3.96.  UA positive for UTI.  [facility] Course:   Acute renal failure superimposed on stage 4 chronic kidney disease ([place]) -Status post renal transplant 2007, recent worsening renal function, most renal biopsy 09/2017 showed borderline cellular rejection on background  of the transplant. -Started on torsemide, creatinine continues to be stable at 3.9   Intractable nausea, vomiting, abdominal pain, acute pyelonephritis -Patient has underlying gastroparesis, will continue Reglan -Nausea vomiting improving -CT abdomen/pelvis on 11/22 showed right pyelonephritis, no abscess -Urine culture has been negative so far however patient had remarkable improvement after starting IV antibiotics with no abdominal pain nausea vomiting.   -Patient was placed on IV Rocephin, transition to oral Ceftin at the time of discharge for 10 days.    Multiple electrolyte abnormalities including hypokalemia, hypomagnesemia, hypocalcemia -Hypokalemia, hypomagnesemia, hypocalcemia, replaced  Anemia of chronic disease -H&H, trending down, 7.3, no bleeding -Patient receives Epogen every 2 weeks, last received on 01/26/2018.  -Patient was given Aranesp 100 mcg x 1 during hospitalization, she will resume her outpatient people that she received every 14 days.  Hypertension BP stable  Diabetes mellitus type 2, insulin-dependent Somewhat uncontrolled, patient counseled on better glycemic control, continue insulin regimen  History of renal  transplant Continue immunosuppressive regimen tacrolimus, Myfortic, prednisone  Anasarca Secondary to nephrotic range proteinuria, hypoalbuminemia, progressive CKD, started on torsemide   Left toe wound Outpatient follow-up with Dr. Quentin Cornwall   Day of Discharge S: Wants to go home, no fevers or chills.  BP 139/89 (BP Location: Left Arm)   Pulse 91   Temp 98.4 F (36.9 C) (Oral)   Resp 18   Ht 5\' 6"  (1.676 m)   Wt 81 kg   SpO2 91%   BMI 28.82 kg/m   Physical Exam: General: Alert and awake oriented x3 not in any acute distress. HEENT: anicteric sclera, pupils reactive to light and accommodation CVS: S1-S2 clear no murmur rubs or gallops Chest: clear to auscultation bilaterally, no wheezing rales or rhonchi Abdomen: soft nontender, nondistended, normal bowel sounds Extremities: no cyanosis, clubbing or edema noted bilaterally Neuro: Cranial nerves II-XII intact, no focal neurological deficits   The results of significant diagnostics from this hospitalization (including imaging, microbiology, ancillary and laboratory) are listed below for reference.      Procedures/Studies:  Ct Abdomen Pelvis Wo Contrast  Result Date: 02/03/2018 CLINICAL DATA:  Complicated pyelonephritis. EXAM: CT ABDOMEN AND PELVIS WITHOUT CONTRAST TECHNIQUE: Multidetector CT imaging of the abdomen and pelvis was performed following the standard protocol without IV contrast. COMPARISON:  11/11/2017 FINDINGS: Lower chest: Mild dependent changes in the lung bases. Hepatobiliary: No focal liver abnormality is seen. Status post cholecystectomy. No biliary dilatation. Pancreas: Unremarkable. No pancreatic ductal dilatation or surrounding inflammatory changes. Spleen: Normal in size without focal abnormality. Adrenals/Urinary Tract: No adrenal gland nodules. Right kidney is absent, either congenital or surgically. Left kidney is extremely atrophic. 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 No hydronephrosis  or hydroureter is seen. There is stranding in the fat around the pelvic right kidney and in the renal hilum which is compatible with pyelonephritis. Low-attenuation zone in the inferior kidney is unchanged since previous study probably representing a cyst although subcapsular collection could also possibly have this appearance. Bladder wall is mildly thickened which may indicate cystitis. No filling defects. Stomach/Bowel: Stomach, small bowel, and colon are mostly decompressed. Scattered stool in the colon. No wall thickening or inflammatory changes. Appendix is normal. Vascular/Lymphatic: No significant vascular findings are present. No enlarged abdominal or pelvic lymph nodes. Reproductive: Status post hysterectomy. No adnexal masses. Other: No free air or free fluid in the abdomen. Abdominal wall musculature is intact. Mild subcutaneous edema. Musculoskeletal: No acute or significant osseous findings. IMPRESSION: 1. Right pelvic renal transplant kidney without hydronephrosis.  Stranding in the fat around the pelvic right kidney and in the renal hilum consistent with pyelonephritis. 2. Low-attenuation zone in the inferior kidney is unchanged since previous study probably representing a cyst although subcapsular collection could also have this appearance. No definite abscess. 3. Mild bladder wall thickening may indicate cystitis. 4. No evidence of bowel obstruction or inflammation. Electronically Signed   By: Lucienne Capers M.D.   On: 02/03/2018 22:45   Dg Abdomen Acute W/chest  Result Date: 02/02/2018 CLINICAL DATA:  Chronic abdominal pain. EXAM: DG ABDOMEN ACUTE W/ 1V CHEST COMPARISON:  CT scan 10/21/2017 FINDINGS: The upright chest x-ray demonstrates low lung volumes with minimal streaky basilar atelectasis but no infiltrates, edema or effusions. Two views of the abdomen demonstrate a paucity of bowel gas. Minimal scattered air and stool in the left colon. No dilated loops of small bowel are demonstrated.  The soft tissue shadows of the abdomen are maintained. No worrisome calcifications are identified. No free air. The bony structures are intact. IMPRESSION: No acute cardiopulmonary findings. No plain film findings for an acute abdominal process. Electronically Signed   By: Marijo Sanes M.D.   On: 02/02/2018 17:44       LAB RESULTS: Basic Metabolic Panel: Recent Labs  Lab 02/05/18 0623 02/06/18 0517  NA 138 138  K 3.9 3.6  CL 114* 110  CO2 19* 20*  GLUCOSE 228* 164*  BUN 59* 54*  CREATININE 3.93* 3.95*  CALCIUM 6.3* 7.0*  MG 2.2 1.7  PHOS 6.4* 6.3*   Liver Function Tests: Recent Labs  Lab 02/02/18 1400  02/05/18 0623 02/06/18 0517  AST 16  --   --   --   ALT 21  --   --   --   ALKPHOS 52  --   --   --   BILITOT 0.7  --   --   --   PROT 6.5  --   --   --   ALBUMIN 2.8*   < > 2.0* 2.1*   < > = values in this interval not displayed.   Recent Labs  Lab 02/02/18 1400  LIPASE 76*   No results for input(s): AMMONIA in the last 168 hours. CBC: Recent Labs  Lab 02/02/18 1400  02/05/18 0623 02/06/18 0517  WBC 12.0*   < > 6.4 6.2  NEUTROABS 8.0*  --   --   --   HGB 10.1*   < > 7.2* 7.9*  HCT 32.3*   < > 24.1* 25.5*  MCV 90.7   < > 93.8 92.7  PLT 295   < > 164 157   < > = values in this interval not displayed.   Cardiac Enzymes: No results for input(s): CKTOTAL, CKMB, CKMBINDEX, TROPONINI in the last 168 hours. BNP: Invalid input(s): POCBNP CBG: Recent Labs  Lab 02/06/18 0725 02/06/18 1117  GLUCAP 206* 298*      Disposition and Follow-up: Discharge Instructions    Diet Carb Modified   Complete by:  As directed    Increase activity slowly   Complete by:  As directed        DISPOSITION: *Home   DISCHARGE FOLLOW-UP Follow-up Information    [name], MD. Schedule an appointment as soon as possible for a visit in 2 week(s).   Specialty:  Internal Medicine Why:  appointment 02/08/2018 at 9:30 am, please call if you are unable to keep your  appt Contact information: [address] [place] [address] [phone]  Rexene Agent, MD. Schedule an appointment as soon as possible for a visit in 2 week(s).   Specialty:  Nephrology Contact information: [place] [address] [phone]            Time coordinating discharge:  25 minutes  Signed:   Estill Cotta M.D. [facility] 02/06/2018, 1:16 PM Pager: [phone]

## 2018-02-06 NOTE — Progress Notes (Signed)
Patient discharged to home with family . After visit Summary reviewed. Patient capable of reverbalizing medications and follow up visits. No signs and symptoms of distress noted. Patient educated to return to the ED in the case of an emergency. Dillon Bjork RN

## 2018-02-06 NOTE — Care Management Important Message (Signed)
Important Message  Patient Details  Name: [name] MRN: [id number] Date of Birth: May 16, 1985   Medicare Important Message Given:  Yes    Erenest Rasher, RN 02/06/2018, 10:20 AM

## 2018-02-06 NOTE — Progress Notes (Signed)
Inpatient Diabetes Program Recommendations  AACE/ADA: New Consensus Statement on Inpatient Glycemic Control (2015)  Target Ranges:  Prepandial:   less than 140 mg/dL      Peak postprandial:   less than 180 mg/dL (1-2 hours)      Critically ill patients:  140 - 180 mg/dL   Lab Results  Component Value Date   GLUCAP 298 (H) 02/06/2018   HGBA1C 10.9 (H) 02/03/2018    Review of Glycemic Control  Diabetes history: Type 2 Outpatient Diabetes medications: Tresiba 25 units QAM, Novolog 12-15 units 5-6 times per day Current orders for Inpatient glycemic control: Lantus 15 units every HS, Novolog SENSITIVE correction scale TID & HS  Inpatient Diabetes Program Recommendations: HgbA1C: A1C 10.9% on 02/03/18 indicating an average glucose of 266 mg/dl over the past 2-3 months. Prior A1C was 8.3% on 10/24/17.  Noted that blood sugars continue to be greater than 180 mg/dl. Recommend increasing Lantus to 20 units daily and increasing Novolog meal coverage to 6 units TID if eating at least 50% of meal.  Harvel Ricks RN BSN CDE Diabetes Coordinator Pager: [phone]  8am-5pm

## 2018-02-06 NOTE — Care Management Note (Signed)
Case Management Note  Patient Details  Name: [name] MRN: [id number] Date of Birth: 06-Mar-1986  Subjective/Objective:     Acute renal failure, CKD IV, Nausea, vomiting, hx renal transplant               Action/Plan: NCM spoke to pt and barriers in getting meds or transportation to her appts. Her brother will assist as needed with transportation. Appt for PCP arranged for 11/27 at 9:30 am.   Expected Discharge Date:  02/06/18               Expected Discharge Plan:  Home/Self Care  In-House Referral:  NA  Discharge planning Services  CM Consult  Post Acute Care Choice:  NA Choice offered to:  NA  DME Arranged:  N/A DME Agency:  NA  HH Arranged:  NA HH Agency:  NA  Status of Service:  Completed, signed off  If discussed at [place] of Stay Meetings, dates discussed:    Additional Comments:  Erenest Rasher, RN 02/06/2018, 10:39 AM

## 2018-02-06 NOTE — Progress Notes (Signed)
Patient ID: [name], female   DOB: 1985-11-20, 32 y.o.   MRN: [id number] [facility] Progress Note   Assessment/ Plan:   1.  Acute kidney injury on chronic kidney disease stage IV T: Likely is progressing CKD as primary issue.  COnt IS, Torsemide. Pt informed likely to progress to ESRD in near future, will need RRT plannign with Dr. Lorrene Reid.  2.  Hypokalemia/hypomagnesemia: Stable, tolerating PO 3.  Mixed gap/non-gap metabolic acidosis: stable on sodium bicarbonate 1300 TID 4.  Abdominal pain/nausea/vomiting: Appears to be improving with symptomatic management-plausible that this might be gastroparesis however, UCx NGF  Off ABX 5.  Anasarca: Secondary to nephrotic range proteinuria/progressive chronic kidney disease-with her hypoalbuminemia, I started her yesterday on torsemide 6.  Hypocalcemia: s/p Increase calcitriol dose and continue supplemental calcium.  fCorrects appropriatley  Subjective:   No /co Wants to go home Tol PO  No SOB or CP   Objective:   BP 139/89 (BP Location: Left Arm)   Pulse 91   Temp 98.4 F (36.9 C) (Oral)   Resp 18   Ht 5\' 6"  (1.676 m)   Wt 81 kg   SpO2 91%   BMI 28.82 kg/m   Intake/Output Summary (Last 24 hours) at 02/06/2018 1113 Last data filed at 02/06/2018 0900 Gross per 24 hour  Intake 1740 ml  Output 3250 ml  Net -1510 ml   Weight change:   Physical Exam: Gen: Appears comfortable, awake, alert NAD CVS: Pulse regular rhythm, normal rate, S1 and S2 normal Resp: Diminished breath sounds over bases-poor inspiratory effort Abd: Soft, obese, non-tender Ext: 1+ lower extremity edema  Imaging: No results found.  Labs: BMET Recent Labs  Lab 02/02/18 1400 02/02/18 1441 02/03/18 0447 02/03/18 1520 02/04/18 0446 02/05/18 0623 02/06/18 0517  NA 141  --  140 138 137 138 138  K 2.9*  --  2.9* 4.1 3.4* 3.9 3.6  CL 114*  --  118* 116* 115* 114* 110  CO2 17*  --  15* 15* 15* 19* 20*  GLUCOSE 185*  --  73 130* 159* 228*  164*  BUN 81*  --  70* 67* 64* 59* 54*  CREATININE 3.96*  --  3.74* 3.75* 3.95* 3.93* 3.95*  CALCIUM 5.4*  --  4.9* 5.1* 5.0* 6.3* 7.0*  PHOS  --  5.5* 5.7*  --  5.6* 6.4* 6.3*   CBC Recent Labs  Lab 02/02/18 1400 02/03/18 0447 02/04/18 0446 02/05/18 0623 02/06/18 0517  WBC 12.0* 10.6* 7.3 6.4 6.2  NEUTROABS 8.0*  --   --   --   --   HGB 10.1* 8.1* 7.3* 7.2* 7.9*  HCT 32.3* 26.2* 24.3* 24.1* 25.5*  MCV 90.7 92.9 93.8 93.8 92.7  PLT 295 204 191 164 157    Medications:    . calcitRIOL  0.5 mcg Oral Daily  . calcium carbonate  1,000 mg of elemental calcium Oral TID WC  . calcium carbonate  800 mg of elemental calcium Oral QHS  . carvedilol  12.5 mg Oral BID WC  . cholecalciferol  1,000 Units Oral Daily  . famotidine  10 mg Oral Daily  . fluticasone  2 spray Each Nare Daily  . gabapentin  300 mg Oral QHS  . insulin aspart  0-5 Units Subcutaneous QHS  . insulin aspart  0-9 Units Subcutaneous TID WC  . insulin glargine  15 Units Subcutaneous QHS  . loratadine  10 mg Oral Daily  . magnesium gluconate  500 mg Oral BID  .  metoCLOPramide (REGLAN) injection  10 mg Intravenous Q12H  . mycophenolate  720 mg Oral BID  . predniSONE  10 mg Oral Q breakfast  . saccharomyces boulardii  250 mg Oral Daily  . simvastatin  20 mg Oral Q M,W,F  . sodium bicarbonate  1,300 mg Oral TID  . tacrolimus  3 mg Oral BID  . torsemide  80 mg Oral Daily   Pearson Grippe, MD 02/06/2018, 11:13 AM

## 2018-02-06 NOTE — Discharge Summary (Deleted)
[facility]                                                                              Patient Demographics  [name], is a 32 y.o. female, DOB - 04/27/1985, EHO:[id number]  Admit date - 02/02/2018   Admitting Physician Albertine Patricia, MD  Outpatient Primary MD for the patient is [name], MD  Outpatient specialists:   LOS - 3  days   Medical records reviewed and are as summarized below:    Chief Complaint  Patient presents with  . Abdominal Pain       Brief summary   Patient is a 32 year old female with IDDM, gastroparesis, trisomy X, hypertension, ESRD status post renal transplant 2007, recent worsening renal function presented with abdominal pain nausea and vomiting for 24 hours prior to admission.  Stated could not keep anything down and resembled her previous gastroparesis, denied any fevers or chills.  No hematemesis.  Patient follows Dr. Lorrene Reid, baseline creatinine 1.9, recently was 3 as of 11/7.  Most recent renal biopsy 09/2017 which did show borderline cellular rejection on background of transplant.  Patient was found to have multiple electrolyte abnormalities at the time of admission with potassium 2.9 calcium 5.4, creatinine 3.96.  UA positive for UTI.   Assessment & Plan    Principal Problem:   Acute renal failure superimposed on stage 4 chronic kidney disease ([place]) -Status post renal transplant 2007, recent worsening renal function, most renal biopsy 09/2017 showed borderline cellular rejection on background of the transplant. -Started on torsemide, creatinine continues to be stable at 3.9   Intractable nausea, vomiting, abdominal pain, acute pyelonephritis -Patient has underlying gastroparesis, will continue Reglan -Nausea vomiting improving -CT abdomen/pelvis on 11/22 showed right pyelonephritis, no abscess -Urine culture has been negative so far however patient had remarkable improvement after starting IV antibiotics with no  abdominal pain nausea vomiting.   -Patient was placed on IV Rocephin, transition to oral Ceftin at the time of discharge for 10 days.    Multiple electrolyte abnormalities including hypokalemia, hypomagnesemia, hypocalcemia -Hypokalemia, hypomagnesemia, hypocalcemia, replaced  Anemia of chronic disease -H&H, trending down, 7.3, no bleeding -Patient receives Epogen every 2 weeks, last received on 01/26/2018.  -Patient was given Aranesp 100 mcg x 1 during hospitalization, she will resume her outpatient people that she received every 14 days.  Hypertension BP stable  Diabetes mellitus type 2, insulin-dependent Somewhat uncontrolled, patient counseled on better glycemic control, continue insulin regimen  History of renal transplant Continue immunosuppressive regimen tacrolimus, Myfortic, prednisone  Anasarca Secondary to nephrotic range proteinuria, hypoalbuminemia, progressive CKD, started on torsemide   Left toe wound Outpatient follow-up with Dr. Quentin Cornwall  Code Status: Full CODE STATUS DVT Prophylaxis:   SCD's Family Communication: Discussed in detail with the patient, all imaging results, lab results explained to the patient    Disposition Plan: Hopefully DC home in a.m.  Time Spent in minutes  35  Procedures:  CT abdomen  Consultants:   Nephrology  Antimicrobials:   IV Rocephin 11/23>>   Medications  Scheduled Meds: . calcitRIOL  0.5 mcg Oral Daily  . calcium carbonate  1,000 mg  of elemental calcium Oral TID WC  . calcium carbonate  800 mg of elemental calcium Oral QHS  . carvedilol  12.5 mg Oral BID WC  . cholecalciferol  1,000 Units Oral Daily  . famotidine  10 mg Oral Daily  . fluticasone  2 spray Each Nare Daily  . gabapentin  300 mg Oral QHS  . insulin aspart  0-5 Units Subcutaneous QHS  . insulin aspart  0-9 Units Subcutaneous TID WC  . insulin glargine  15 Units Subcutaneous QHS  . loratadine  10 mg Oral Daily  . magnesium gluconate  500 mg Oral  BID  . metoCLOPramide (REGLAN) injection  10 mg Intravenous Q12H  . mycophenolate  720 mg Oral BID  . predniSONE  10 mg Oral Q breakfast  . saccharomyces boulardii  250 mg Oral Daily  . simvastatin  20 mg Oral Q M,W,F  . sodium bicarbonate  1,300 mg Oral TID  . tacrolimus  3 mg Oral BID  . torsemide  80 mg Oral Daily   Continuous Infusions: . sodium chloride 250 mL (02/05/18 1005)  . cefTRIAXone (ROCEPHIN)  IV 2 g (02/06/18 1100)   PRN Meds:.sodium chloride, acetaminophen **OR** acetaminophen, hydrALAZINE, morphine injection, ondansetron **OR** ondansetron (ZOFRAN) IV, oxyCODONE-acetaminophen, polyethylene glycol, promethazine, promethazine   Antibiotics   Anti-infectives (From admission, onward)   Start     Dose/Rate Route Frequency Ordered Stop   02/06/18 0000  cefUROXime (CEFTIN) 500 MG tablet  Status:  Discontinued     500 mg Oral 2 times daily with meals 02/06/18 1004 02/06/18    02/06/18 0000  cefUROXime (CEFTIN) 500 MG tablet     500 mg Oral Daily 02/06/18 1006 02/16/18 2359   02/04/18 0700  cefTRIAXone (ROCEPHIN) 2 g in sodium chloride 0.9 % 100 mL IVPB     2 g 200 mL/hr over 30 Minutes Intravenous Daily 02/04/18 0625          Subjective:   [name] was seen and examined today.  No acute issues, feeling better  Objective:   Vitals:   02/05/18 1649 02/05/18 2151 02/06/18 0507 02/06/18 0925  BP: (!) 186/113 (!) 157/101 127/87 139/89  Pulse: 98 93 91 91  Resp: 20 18 18 18   Temp: 98.5 F (36.9 C) 98.4 F (36.9 C) 98.4 F (36.9 C) 98.4 F (36.9 C)  TempSrc: Oral Oral  Oral  SpO2: 98% 98% 98% 91%  Weight:      Height:        Intake/Output Summary (Last 24 hours) at 02/06/2018 1307 Last data filed at 02/06/2018 0900 Gross per 24 hour  Intake 1500 ml  Output 3000 ml  Net -1500 ml     Wt Readings from Last 3 Encounters:  02/04/18 81 kg  12/15/17 78.9 kg  11/14/17 77.1 kg     Physical Exam  General: Alert and oriented x 3,  NAD  Eyes  HEENT:   Cardiovascular: S1 S2 auscultated,  Regular rate and rhythm. No pedal edema b/l  Respiratory: Clear to auscultation bilaterally, no wheezing, rales or rhonchi  Gastrointestinal: Soft, nontender, nondistended, + bowel sounds  Ext: no pedal edema bilaterally  Neuro no neuro deficits  Musculoskeletal: No digital cyanosis, clubbing  Skin: No rashes  Psych: Normal affect and demeanor, alert and oriented x3           Data Reviewed:  I have personally reviewed following labs and imaging studies  Micro Results Recent Results (from the past 240 hour(s))  Urine culture  Status: Abnormal   Collection Time: 02/02/18  3:20 PM  Result Value Ref Range Status   Specimen Description URINE, RANDOM  Final   Special Requests   Final    NONE Performed at [facility], [place] [address], [place], [place] [address]    Culture (A)  Final    20,000 COLONIES/mL MULTIPLE SPECIES PRESENT, SUGGEST RECOLLECTION   Report Status 02/03/2018 FINAL  Final  Urine Culture     Status: Abnormal   Collection Time: 02/04/18  8:02 AM  Result Value Ref Range Status   Specimen Description URINE, CLEAN CATCH  Final   Special Requests   Final    NONE Performed at [facility], [place] [address] [place], [place] [address]    Culture (A)  Final    10,000 COLONIES/mL MULTIPLE SPECIES PRESENT, SUGGEST RECOLLECTION   Report Status 02/05/2018 FINAL  Final    Radiology Reports Ct Abdomen Pelvis Wo Contrast  Result Date: 02/03/2018 CLINICAL DATA:  Complicated pyelonephritis. EXAM: CT ABDOMEN AND PELVIS WITHOUT CONTRAST TECHNIQUE: Multidetector CT imaging of the abdomen and pelvis was performed following the standard protocol without IV contrast. COMPARISON:  11/11/2017 FINDINGS: Lower chest: Mild dependent changes in the lung bases. Hepatobiliary: No focal liver abnormality is seen. Status post cholecystectomy. No biliary dilatation. Pancreas: Unremarkable. No pancreatic ductal  dilatation or surrounding inflammatory changes. Spleen: Normal in size without focal abnormality. Adrenals/Urinary Tract: No adrenal gland nodules. Right kidney is absent, either congenital or surgically. Left kidney is extremely atrophic. 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 No hydronephrosis or hydroureter is seen. There is stranding in the fat around the pelvic right kidney and in the renal hilum which is compatible with pyelonephritis. Low-attenuation zone in the inferior kidney is unchanged since previous study probably representing a cyst although subcapsular collection could also possibly have this appearance. Bladder wall is mildly thickened which may indicate cystitis. No filling defects. Stomach/Bowel: Stomach, small bowel, and colon are mostly decompressed. Scattered stool in the colon. No wall thickening or inflammatory changes. Appendix is normal. Vascular/Lymphatic: No significant vascular findings are present. No enlarged abdominal or pelvic lymph nodes. Reproductive: Status post hysterectomy. No adnexal masses. Other: No free air or free fluid in the abdomen. Abdominal wall musculature is intact. Mild subcutaneous edema. Musculoskeletal: No acute or significant osseous findings. IMPRESSION: 1. Right pelvic renal transplant kidney without hydronephrosis. Stranding in the fat around the pelvic right kidney and in the renal hilum consistent with pyelonephritis. 2. Low-attenuation zone in the inferior kidney is unchanged since previous study probably representing a cyst although subcapsular collection could also have this appearance. No definite abscess. 3. Mild bladder wall thickening may indicate cystitis. 4. No evidence of bowel obstruction or inflammation. Electronically Signed   By: Lucienne Capers M.D.   On: 02/03/2018 22:45   Dg Abdomen Acute W/chest  Result Date: 02/02/2018 CLINICAL DATA:  Chronic abdominal pain. EXAM: DG ABDOMEN ACUTE W/ 1V CHEST COMPARISON:  CT scan 10/21/2017  FINDINGS: The upright chest x-ray demonstrates low lung volumes with minimal streaky basilar atelectasis but no infiltrates, edema or effusions. Two views of the abdomen demonstrate a paucity of bowel gas. Minimal scattered air and stool in the left colon. No dilated loops of small bowel are demonstrated. The soft tissue shadows of the abdomen are maintained. No worrisome calcifications are identified. No free air. The bony structures are intact. IMPRESSION: No acute cardiopulmonary findings. No plain film findings for an acute abdominal process. Electronically Signed  By: Marijo Sanes M.D.   On: 02/02/2018 17:44    Lab Data:  CBC: Recent Labs  Lab 02/02/18 1400 02/03/18 0447 02/04/18 0446 02/05/18 0623 02/06/18 0517  WBC 12.0* 10.6* 7.3 6.4 6.2  NEUTROABS 8.0*  --   --   --   --   HGB 10.1* 8.1* 7.3* 7.2* 7.9*  HCT 32.3* 26.2* 24.3* 24.1* 25.5*  MCV 90.7 92.9 93.8 93.8 92.7  PLT 295 204 191 164 562   Basic Metabolic Panel: Recent Labs  Lab 02/02/18 1441 02/03/18 0447 02/03/18 1520 02/04/18 0446 02/05/18 0623 02/06/18 0517  NA  --  140 138 137 138 138  K  --  2.9* 4.1 3.4* 3.9 3.6  CL  --  118* 116* 115* 114* 110  CO2  --  15* 15* 15* 19* 20*  GLUCOSE  --  73 130* 159* 228* 164*  BUN  --  70* 67* 64* 59* 54*  CREATININE  --  3.74* 3.75* 3.95* 3.93* 3.95*  CALCIUM  --  4.9* 5.1* 5.0* 6.3* 7.0*  MG 1.2* 1.6*  --  1.5* 2.2 1.7  PHOS 5.5* 5.7*  --  5.6* 6.4* 6.3*   GFR: Estimated Creatinine Clearance: 21.5 mL/min (A) (by C-G formula based on SCr of 3.95 mg/dL (H)). Liver Function Tests: Recent Labs  Lab 02/02/18 1400 02/04/18 0446 02/05/18 0623 02/06/18 0517  AST 16  --   --   --   ALT 21  --   --   --   ALKPHOS 52  --   --   --   BILITOT 0.7  --   --   --   PROT 6.5  --   --   --   ALBUMIN 2.8* 1.9* 2.0* 2.1*   Recent Labs  Lab 02/02/18 1400  LIPASE 76*   No results for input(s): AMMONIA in the last 168 hours. Coagulation Profile: No results for input(s):  INR, PROTIME in the last 168 hours. Cardiac Enzymes: No results for input(s): CKTOTAL, CKMB, CKMBINDEX, TROPONINI in the last 168 hours. BNP (last 3 results) No results for input(s): PROBNP in the last 8760 hours. HbA1C: No results for input(s): HGBA1C in the last 72 hours. CBG: Recent Labs  Lab 02/05/18 1216 02/05/18 1649 02/05/18 2153 02/06/18 0725 02/06/18 1117  GLUCAP 186* 242* 232* 206* 298*   Lipid Profile: No results for input(s): CHOL, HDL, LDLCALC, TRIG, CHOLHDL, LDLDIRECT in the last 72 hours. Thyroid Function Tests: No results for input(s): TSH, T4TOTAL, FREET4, T3FREE, THYROIDAB in the last 72 hours. Anemia Panel: No results for input(s): VITAMINB12, FOLATE, FERRITIN, TIBC, IRON, RETICCTPCT in the last 72 hours. Urine analysis:    Component Value Date/Time   COLORURINE STRAW (A) 02/02/2018 [place] 02/02/2018 1348   LABSPEC 1.011 02/02/2018 1348   PHURINE 7.0 02/02/2018 1348   GLUCOSEU 150 (A) 02/02/2018 1348   HGBUR NEGATIVE 02/02/2018 1348   HGBUR negative 01/26/2010 [place] 02/02/2018 1348   BILIRUBINUR NEG 01/01/2011 [place] 02/02/2018 1348   PROTEINUR >=300 (A) 02/02/2018 1348   UROBILINOGEN 0.2 01/09/2015 1333   NITRITE NEGATIVE 02/02/2018 1348   LEUKOCYTESUR NEGATIVE 02/02/2018 1348       M.D. [facility] 02/06/2018, 1:07 PM  Pager: [phone] Between 7am to 7pm - call Pager - [phone]  After 7pm go to [email or web address] - password [email or web address]  Call night coverage person covering after 7pm

## 2018-02-08 ENCOUNTER — Encounter: Payer: Medicare Other | Admitting: Family Medicine

## 2018-02-08 DIAGNOSIS — E11621 Type 2 diabetes mellitus with foot ulcer: Secondary | ICD-10-CM | POA: Diagnosis not present

## 2018-02-08 DIAGNOSIS — K3184 Gastroparesis: Secondary | ICD-10-CM | POA: Diagnosis not present

## 2018-02-08 DIAGNOSIS — E119 Type 2 diabetes mellitus without complications: Secondary | ICD-10-CM | POA: Diagnosis not present

## 2018-02-08 DIAGNOSIS — Z940 Kidney transplant status: Secondary | ICD-10-CM | POA: Diagnosis not present

## 2018-02-08 DIAGNOSIS — D649 Anemia, unspecified: Secondary | ICD-10-CM | POA: Diagnosis not present

## 2018-02-08 DIAGNOSIS — L97522 Non-pressure chronic ulcer of other part of left foot with fat layer exposed: Secondary | ICD-10-CM | POA: Diagnosis not present

## 2018-02-08 DIAGNOSIS — M1990 Unspecified osteoarthritis, unspecified site: Secondary | ICD-10-CM | POA: Diagnosis not present

## 2018-02-08 DIAGNOSIS — I10 Essential (primary) hypertension: Secondary | ICD-10-CM | POA: Diagnosis not present

## 2018-02-10 ENCOUNTER — Encounter (HOSPITAL_COMMUNITY): Payer: Medicare Other

## 2018-02-13 NOTE — Progress Notes (Signed)
NIRA, VISSCHER ([id number]) Visit Report for 02/08/2018 Chief Complaint Document Details Patient Name: [name], [name] Date of Service: 02/08/2018 2:30 PM Medical Record Number: [id number] Patient Account Number: [id number] Date of Birth/Sex: [birth year] (32 y.o. F) Treating RN: Cornell Barman Primary Care Provider: [name] Other Clinician: Referring Provider: [name] Treating Provider/Extender: Oneida Arenas in Treatment: 9 Information Obtained from: Patient Chief Complaint She is here in follow evaluation of left foot ulcers Electronic Signature(s) Signed: 02/12/2018 5:29:42 PM By: Beather Arbour FNP-C Entered By: Beather Arbour on 02/08/2018 15:12:23 [name] ([id number]) -------------------------------------------------------------------------------- HPI Details Patient Name: [name] Date of Service: 02/08/2018 2:30 PM Medical Record Number: [id number] Patient Account Number: [id number] Date of Birth/Sex: [birth year] (32 y.o. F) Treating RN: Cornell Barman Primary Care Provider: [name] Other Clinician: Referring Provider: [name] Treating Provider/Extender: Oneida Arenas in Treatment: 9 History of Present Illness HPI Description: 02/14/17 on evaluation today patient appears to be doing fairly well all things considered. She tells me that around the middle of November she was sleeping close to a space heater when she woke up and sustained a burn to her right first toe. She has a history of diabetes which is uncontrolled her last hemoglobin A1c with 11.5 on December 12, 2016. She is status post having had a kidney transplant and this was necessitated by apparently a high dose of antibiotics given to her as a child. Overall she has been tolerating the wound fairly well all things considered she has been putting antibiotic ointment on the area but otherwise no other treatment. She did go to the ER twice the station left without being  seen due to the long wait. She continues to have discomfort rated to be 3-4/10 which is worse with toucher cleansing of the wound. 02/24/2017 -- she has uncontrolled diabetes mellitus with hyperglycemia and her last hemoglobin A1c was 14%. I have asked her to be more careful and see her PCP regarding this. She is also not wearing bilateral compression stockings as recommended before. 03/18/17 on evaluation today patient appears to be doing very well and in fact her wound appears to be completely healed. She has been tolerating the dressing changes without complication. Fortunately she is having no pain. *** 05/26/17 patient seen today for reevaluation concerning her right great toe ulcer. She has previously been evaluated by myself in January through the beginning of the year before subsequently being discharged. She was completely healed at that point. Unfortunately patient tells me that she's unsure of exactly what happened but this wound has reopened. Upon hearing her story it sounds as if she likely injured this utilizing a pumis stone that she uses to work on her calluses. At one point she even asked me if there was a different way that she could potentially work on all of the callous and dry skin on her foot other than utilizing the stone. With that being said her story at this point was that she felt that she may have burnt her foot specifically the great toe on a space heater that she keeps on the bed stand beside her bed. With that being said I do not think that's very likely the toes around and all the surrounding region does not appear to show any signs of thermal injury nor does this ulcer appear to really be thermal in nature. It very much looks more like a diabetic foot ulcer. Patient's most recent hemoglobin A1c was 11.2 that was in January 2019. She has  not had this check since she tells me that her blood sugars run in the "300s". Otherwise not much has really changed since I saw her  previously. 06/02/17- She is here in follow-up evaluation for right great toe ulcer. Plain film x-ray revealed evidence worrisome for osteomyelitis, will order MRI; we discussed these findings. She presents to the clinic with feelings of hypoglycemia, she was provided with an orange juice in her glucose was 83; despite this being a normal glucose level am not surprised she is having hypoglycemic symptoms. She tolerated debridement. We discussed the need for tight glycemic control (her a1c has been 11 in Nov and Jan), offloading/reduced trauma and compliance with medical treatment plan. A consult for ID was placed 06/09/17-She is here in follow-up evaluation for right great toe ulcer. Her MRI is scheduled for tomorrow. The wound is significantly improved with granulation tissue encompassing most of the wound, small amount of nonviable tissue close to the nail with uneven coloration of the toenail itself, currently no lifting. She has an appointment with podiatry and endocrinology on 4/9; an appointment with Dr. Ola Spurr on 4/11. I prescribed Levaquin last week which she has not started. Due to the improvement of the wound with granulation tissue, I cultured the wound after debridement and we will hold off on initiating Levaquin. I will reach out to her nephrologist, Dr. Lorrene Reid regarding antibiotic selection. If the MRI is negative for osteomyelitis we will cancel her appointment with Dr. Ola Spurr. She states she has been more diligent in maintaining better glycemic control with more levels being below 200 then over; she has been encouraged to maintain this for continued wound healing. 06/16/17-She is here in follow up evaluation for right great toe ulcer. 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 She does have an appointment with Dr. Ola Spurr on 4/11. Culture that was obtained last week grew oxacillin sensitive staph aureus, she was initiated on the Levaquin that was originally ordered on 3/21. She admits to  taking her loading dose on Monday 4/1 and starting her 250 mg daily dose on 4/2. We will extend the Levaquin 250 mg daily through her appointment with Dr. Ola Spurr. There continues to be improvement in both measurements and appearance, we will transition from Santyl to [facility]. She states her glucose levels are consistently less than 200 with fewer times greater than 200. She will follow-up next week MATHEW, STORCK ([id number]) Readmission: 12/01/17 on evaluation today patient presents for reevaluation due to ulcers on the left foot. She tells me at this point though honestly she is a poor historian but she is unsure when these all showed up. She's been tolerating the dressing changes without complication using just a in the ointment and a Band-Aid as needed. With that being said she did become somewhat concerned about these and therefore made the appointment to come in for further evaluation with [place]. Fortunately there is no evidence of infection at this time. 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 Currently all the wounds on the left. No fevers, chills, nausea, or vomiting noted at this time. Patientos last A1c was 8.15 October 2017. 12/08/17 on evaluation today patient actually appears to be doing much better in regard to her wounds in general. In fact the Santyl seems to have done very well as far as losing up the necrotic material at this point in time and overall I do feel she's made great progress. One of the areas appears to have healed the other three are all doing better.  12/21/17 patient is a 32 year old woman who is listed in our record is a type II diabetic although in [place] as a type I diabetic. In any case she is on insulin. She has wounds on her left foot including the dorsal left first toe medial left second toe and a thickened eschar on the left third toe. We've been using silver collagen. It doesn't appear that she is actually  offloading these. 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 She still has an open area over the proximal phalanx of the left great toe. been using silver collagen 01/25/18; the patient is missed some appointments. The areas on her left second and third toe remain healed. The open area over the proximal phalanx of the left great toe apparently was healed last week as well. Then the patient noted a blister form and she became concerned and came back into the clinic.She is back in ordinary footwear. 02/01/18; the blister opened on its own. She has a small open area over the proximal phalanx of the left great toe. She also showed me a draining area on her Right leg leg from a cat scratch this was after the clinic appointment 02/08/2018 Seen today for follow up and management of open wound over the proximal phalanx of the left great. Wound has been progressing well today. The area is almost healed. Will need to still monitor the center aspect of the wound to make sure that it continues to close. Was recently inpatient from 11/21o11/25/19 for Right-sided pyelonephritis. Denies any fevers, chills, pain, or shortness of breath during visit today. Electronic Signature(s) Signed: 02/12/2018 5:29:42 PM By: Beather Arbour FNP-C Entered By: Beather Arbour on 02/08/2018 16:30:43 [name] ([id number]) -------------------------------------------------------------------------------- Physical Exam Details Patient Name: [name] Date of Service: 02/08/2018 2:30 PM Medical Record Number: [id number] Patient Account Number: [id number] Date of Birth/Sex: Jan 30, 1986 (32 y.o. F) Treating RN: Cornell Barman Primary Care Provider: [name] Other Clinician: Referring Provider: [name] Treating Provider/Extender: Oneida Arenas in Treatment: 9 Constitutional appears in no distress. Respiratory Respiratory  effort is easy and symmetric bilaterally. Rate is normal at rest and on room air.. Cardiovascular Extremities are free of varicosities, clubbing or edema. Peripheral pulses strong and equal. Capillary refill < 3 seconds.. Integumentary (Hair, Skin) wound. Electronic Signature(s) Signed: 02/12/2018 5:29:42 PM By: Beather Arbour FNP-C Entered By: Beather Arbour on 02/08/2018 15:27:26 [name] ([id number]) -------------------------------------------------------------------------------- Physician Orders Details Patient Name: [name] Date of Service: 02/08/2018 2:30 PM Medical Record Number: [id number] Patient Account Number: [id number] Date of Birth/Sex: 06/25/1985 (32 y.o. F) Treating RN: Cornell Barman Primary Care Provider: [name] Other Clinician: Referring Provider: [name] Treating Provider/Extender: Oneida Arenas in Treatment: 9 Verbal / Phone Orders: No Diagnosis Coding Wound Cleansing Wound #4 Left Toe Great o Clean wound with Normal Saline. Primary Wound Dressing Wound #4 Left Toe Great o Silver Alginate Secondary Dressing Wound #4 Left Toe Great o Gauze and Kerlix/Conform Dressing Change Frequency Wound #4 Left Toe Great o Change Dressing Monday, Wednesday, Friday Follow-up Appointments Wound #4 Left Toe Great o Return Appointment in 1 week. Off-Loading Wound #4 Left Toe Great o Other: - do not let toes rub in shoes. Electronic Signature(s) Signed: 02/12/2018 5:29:42 PM By: Beather Arbour FNP-C Entered By: Beather Arbour on 02/08/2018 15:27:35 [name] ([id number]) -------------------------------------------------------------------------------- Problem List Details Patient Name: [name] Date of  Service: 02/08/2018 2:30 PM Medical Record Number: [id number] Patient Account Number: [id number] Date of Birth/Sex: September 05, 1985 (32 y.o. F) Treating RN: Cornell Barman Primary Care Provider: [name] Other  Clinician: Referring Provider: [name] Treating Provider/Extender: Oneida Arenas in Treatment: 9 Active Problems ICD-10 Evaluated Encounter Code Description Active Date Today Diagnosis E11.621 Type 2 diabetes mellitus with foot ulcer 12/01/2017 No Yes L97.522 Non-pressure chronic ulcer of other part of left foot with fat 12/01/2017 No Yes layer exposed Z94.0 Kidney transplant status 12/01/2017 No Yes I10 Essential (primary) hypertension 12/01/2017 No Yes Inactive Problems Resolved Problems Electronic Signature(s) Signed: 02/12/2018 5:29:42 PM By: Beather Arbour FNP-C Entered By: Beather Arbour on 02/08/2018 15:12:03 [name] ([id number]) -------------------------------------------------------------------------------- Progress Note Details Patient Name: [name] Date of Service: 02/08/2018 2:30 PM Medical Record Number: [id number] Patient Account Number: [id number] Date of Birth/Sex: 07/14/1985 (32 y.o. F) Treating RN: Cornell Barman Primary Care Provider: [name] Other Clinician: Referring Provider: [name] Treating Provider/Extender: Oneida Arenas in Treatment: 9 Subjective Chief Complaint Information obtained from Patient She is here in follow evaluation of left foot ulcers History of Present Illness (HPI) 02/14/17 on evaluation today patient appears to be doing fairly well all things considered. She tells me that around the middle of November she was sleeping close to a space heater when she woke up and sustained a burn to her right first toe. She has a history of diabetes which is uncontrolled her last hemoglobin A1c with 11.5 on December 12, 2016. She is status post having had a kidney transplant and this was necessitated by apparently a high dose of antibiotics given to her as a child. Overall she has been tolerating the wound fairly well all things considered she has been putting antibiotic ointment on the area but otherwise no  other treatment. She did go to the ER twice the station left without being seen due to the long wait. She continues to have discomfort rated to be 3-4/10 which is worse with toucher cleansing of the wound. 02/24/2017 -- she has uncontrolled diabetes mellitus with hyperglycemia and her last hemoglobin A1c was 14%. I have asked her to be more careful and see her PCP regarding this. She is also not wearing bilateral compression stockings as recommended before. 03/18/17 on evaluation today patient appears to be doing very well and in fact her wound appears to be completely healed. She has been tolerating the dressing changes without complication. Fortunately she is having no pain. *** 05/26/17 patient seen today for reevaluation concerning her right great toe ulcer. She has previously been evaluated by myself in January through the beginning of the year before subsequently being discharged. She was completely healed at that point. Unfortunately patient tells me that she's unsure of exactly what happened but this wound has reopened. Upon hearing her story it sounds as if she likely injured this utilizing a pumis stone that she uses to work on her calluses. At one point she even asked me if there was a different way that she could potentially work on all of the callous and dry skin on her foot other than utilizing the stone. With that being said her story at this point was that she felt that she may have burnt her foot specifically the great toe on a space heater that she keeps on the bed stand beside her bed. With that being said I do not think that's very likely the toes around and all the surrounding region does not appear to show  any signs of thermal injury nor does this ulcer appear to really be thermal in nature. It very much looks more like a diabetic foot ulcer. Patient's most recent hemoglobin A1c was 11.2 that was in January 2019. She has not had this check since she tells me that her blood sugars  run in the "300s". Otherwise not much has really changed since I saw her previously. 06/02/17- She is here in follow-up evaluation for right great toe ulcer. Plain film x-ray revealed evidence worrisome for osteomyelitis, will order MRI; we discussed these findings. She presents to the clinic with feelings of hypoglycemia, she was provided with an orange juice in her glucose was 83; despite this being a normal glucose level am not surprised she is having hypoglycemic symptoms. She tolerated debridement. We discussed the need for tight glycemic control (her a1c has been 11 in Nov and Jan), offloading/reduced trauma and compliance with medical treatment plan. A consult for ID was placed 06/09/17-She is here in follow-up evaluation for right great toe ulcer. Her MRI is scheduled for tomorrow. The wound is significantly improved with granulation tissue encompassing most of the wound, small amount of nonviable tissue close to the nail with uneven coloration of the toenail itself, currently no lifting. She has an appointment with podiatry and endocrinology on 4/9; an appointment with Dr. Ola Spurr on 4/11. I prescribed Levaquin last week which she has not started. Due to the improvement of the wound with granulation tissue, I cultured the wound after debridement and we will hold off on initiating Levaquin. I will reach out to her nephrologist, Dr. Lorrene Reid regarding antibiotic selection. If the MRI is negative for osteomyelitis we will cancel her appointment with Dr. Ola Spurr. She states she has been more diligent in maintaining better glycemic control with more levels being below 200 then over; she has been encouraged to maintain this for continued wound healing. 06/16/17-She is here in follow up evaluation for right great toe ulcer. 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 She does have an AZHANE, ECKART ([id number]) appointment with Dr. Ola Spurr on 4/11. Culture that was obtained last week grew oxacillin  sensitive staph aureus, she was initiated on the Levaquin that was originally ordered on 3/21. She admits to taking her loading dose on Monday 4/1 and starting her 250 mg daily dose on 4/2. We will extend the Levaquin 250 mg daily through her appointment with Dr. Ola Spurr. There continues to be improvement in both measurements and appearance, we will transition from Santyl to [facility]. She states her glucose levels are consistently less than 200 with fewer times greater than 200. She will follow-up next week Readmission: 12/01/17 on evaluation today patient presents for reevaluation due to ulcers on the left foot. She tells me at this point though honestly she is a poor historian but she is unsure when these all showed up. She's been tolerating the dressing changes without complication using just a in the ointment and a Band-Aid as needed. With that being said she did become somewhat concerned about these and therefore made the appointment to come in for further evaluation with [place]. Fortunately there is no evidence of infection at this time. 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 Currently all the wounds on the left. No fevers, chills, nausea, or vomiting noted at this time. Patient s last A1c was 8.15 October 2017. 12/08/17 on evaluation today patient actually appears to be doing much better in regard to her wounds in general. In fact the Santyl seems to  have done very well as far as losing up the necrotic material at this point in time and overall I do feel she's made great progress. One of the areas appears to have healed the other three are all doing better. 12/21/17 patient is a 32 year old woman who is listed in our record is a type II diabetic although in [place] as a type I diabetic. In any case she is on insulin. She has wounds on her left foot including the dorsal left first toe medial left second toe and a thickened eschar on the left third toe. We've  been using silver collagen. It doesn't appear that she is actually offloading these. 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 She still has an open area over the proximal phalanx of the left great toe. been using silver collagen 01/25/18; the patient is missed some appointments. The areas on her left second and third toe remain healed. The open area over the proximal phalanx of the left great toe apparently was healed last week as well. Then the patient noted a blister form and she became concerned and came back into the clinic.She is back in ordinary footwear. 02/01/18; the blister opened on its own. She has a small open area over the proximal phalanx of the left great toe. She also showed me a draining area on her Right leg leg from a cat scratch this was after the clinic appointment 02/08/2018 Seen today for follow up and management of open wound over the proximal phalanx of the left great. Wound has been progressing well today. The area is almost healed. Will need to still monitor the center aspect of the wound to make sure that it continues to close. Was recently inpatient from 11/21 02/06/18 for fluid overload. Patient History Information obtained from Patient. Family History Diabetes - Father, Heart Disease - Father, Hypertension - Mother,Father, No family history of Cancer, Hereditary Spherocytosis, Kidney Disease, Lung Disease, Seizures, Stroke, Thyroid Problems, Tuberculosis. Social History Never smoker, Marital Status - Single, Alcohol Use - Never, Drug Use - No History, Caffeine Use - Daily. Medical And Surgical History Notes Genitourinary kidney transplant 2007 Review of Systems (ROS) Constitutional Symptoms (General Health) The patient has no complaints or symptoms. Respiratory The patient has no complaints or symptoms. Cardiovascular The patient has no complaints or symptoms. Integumentary  (Skin) Complains or has symptoms of Wounds - left great toe. WAVERLEY, KREMPASKY ([id number]) Objective Constitutional appears in no distress. Vitals Time Taken: 3:38 PM, Height: 66 in, Weight: 174 lbs, BMI: 28.1, Temperature: 98.5 F, Pulse: 95 bpm, Respiratory Rate: 16 breaths/min, Blood Pressure: 119/83 mmHg. Respiratory Respiratory effort is easy and symmetric bilaterally. Rate is normal at rest and on room air.. Cardiovascular Extremities are free of varicosities, clubbing or edema. Peripheral pulses strong and equal. Capillary refill < 3 seconds.. Integumentary (Hair, Skin) wound. Wound #4 status is Open. Original cause of wound was Gradually Appeared. The wound is located on the Left Toe Great. The wound measures 0.1cm length x 0.1cm width x 0.1cm depth; 0.008cm^2 area and 0.001cm^3 volume. There is Fat Layer (Subcutaneous Tissue) Exposed exposed. There is a none present amount of drainage noted. The wound margin is flat and intact. There is no granulation within the wound bed. There is no necrotic tissue within the wound bed. The periwound skin appearance exhibited: Scarring. The periwound skin appearance did not exhibit: Callus, Crepitus, Excoriation, Induration, Rash,  Dry/Scaly, Maceration, Atrophie Blanche, Cyanosis, Ecchymosis, Hemosiderin Staining, Mottled, Pallor, Rubor, Erythema. Periwound temperature was noted as No Abnormality. Assessment Active Problems ICD-10 Type 2 diabetes mellitus with foot ulcer Non-pressure chronic ulcer of other part of left foot with fat layer exposed Kidney transplant status Essential (primary) hypertension Plan Wound Cleansing: Wound #4 Left Toe GreatMAIZY, DAVANZO ([id number]) Clean wound with Normal Saline. Primary Wound Dressing: Wound #4 Left Toe Great: Silver Alginate Secondary Dressing: Wound #4 Left Toe Great: Gauze and Kerlix/Conform Dressing Change Frequency: Wound #4 Left Toe Great: Change Dressing Monday, Wednesday,  Friday Follow-up Appointments: Wound #4 Left Toe Great: Return Appointment in 1 week. Off-Loading: Wound #4 Left Toe Great: Other: - do not let toes rub in shoes. Electronic Signature(s) Signed: 02/12/2018 5:29:42 PM By: Beather Arbour FNP-C Entered By: Beather Arbour on 02/08/2018 15:27:42 [name] ([id number]) -------------------------------------------------------------------------------- ROS/PFSH Details Patient Name: [name] Date of Service: 02/08/2018 2:30 PM Medical Record Number: [id number] Patient Account Number: [id number] Date of Birth/Sex: 1985/04/04 (32 y.o. F) Treating RN: Cornell Barman Primary Care Provider: [name] Other Clinician: Referring Provider: [name] Treating Provider/Extender: Oneida Arenas in Treatment: 9 Information Obtained From Patient Wound History Do you currently have one or more open woundso Yes How many open wounds do you currently haveo 4 Approximately how long have you had your woundso 4 weeks How have you been treating your wound(s) until nowo dry bandage Has your wound(s) ever healed and then re-openedo No Have you had any lab work done in the past montho No Have you tested positive for an antibiotic resistant organism (MRSA, VRE)o No Have you tested positive for osteomyelitis (bone infection)o No Have you had any tests for circulation on your legso No Integumentary (Skin) Complaints and Symptoms: Positive for: Wounds - left great toe Medical History: Negative for: History of Burn; History of pressure wounds Constitutional Symptoms (General Health) Complaints and Symptoms: No Complaints or Symptoms Eyes Medical History: Negative for: Cataracts; Glaucoma; Optic Neuritis Ear/Nose/Mouth/Throat Medical History: Negative for: Chronic sinus problems/congestion; Middle ear problems Hematologic/Lymphatic Medical History: Positive for: Anemia Negative for: Hemophilia; Human Immunodeficiency Virus;  Lymphedema; Sickle Cell Disease Respiratory Complaints and Symptoms: No Complaints or Symptoms Cardiovascular EMBERLEY, KRAL. ([id number]) Complaints and Symptoms: No Complaints or Symptoms Medical History: Positive for: Hypertension Negative for: Angina; Arrhythmia; Congestive Heart Failure; Coronary Artery Disease; Deep Vein Thrombosis; Hypotension; Myocardial Infarction; Peripheral Arterial Disease; Peripheral Venous Disease; Phlebitis; Vasculitis Endocrine Medical History: Positive for: Type II Diabetes Negative for: Type I Diabetes Time with diabetes: 2014 Treated with: Insulin Blood sugar tested every day: Yes Tested : 3x a day Genitourinary Medical History: Negative for: End Stage Renal Disease Past Medical History Notes: kidney transplant 2007 Immunological Medical History: Negative for: Lupus Erythematosus; Raynaudos; Scleroderma Musculoskeletal Medical History: Positive for: Osteoarthritis Negative for: Gout; Rheumatoid Arthritis; Osteomyelitis Neurologic Medical History: Positive for: Neuropathy; Seizure Disorder - hx Negative for: Dementia; Quadriplegia; Paraplegia Oncologic Medical History: Negative for: Received Chemotherapy; Received Radiation Immunizations Pneumococcal Vaccine: Received Pneumococcal Vaccination: No Implantable Devices Family and Social History Cancer: No; Diabetes: Yes - Father; Heart Disease: Yes - Father; Hereditary Spherocytosis: No; Hypertension: Yes - Mother,Father; Kidney Disease: No; Lung Disease: No; Seizures: No; Stroke: No; Thyroid Problems: No; Tuberculosis: No; Never smoker; Marital Status - Single; Alcohol Use: Never; Drug Use: No History; Caffeine Use: Daily; Financial Concerns: No; Food, Clothing or Shelter Needs: No; Support System Lacking: No; Transportation Concerns: No; Advanced DirectivesDORISANN, SCHWANKE ([id number]) No; Patient does not want  information on Advanced Directives; Do not resuscitate: No; Living Will:  No; Medical Power of Attorney: No Physician Affirmation I have reviewed and agree with the above information. Electronic Signature(s) Signed: 02/08/2018 4:25:08 PM By: Gretta Cool, BSN, RN, CWS, Kim RN, BSN Signed: 02/12/2018 5:29:42 PM By: Beather Arbour FNP-C Entered By: Beather Arbour on 02/08/2018 15:26:56 [name] ([id number]) -------------------------------------------------------------------------------- SuperBill Details Patient Name: [name] Date of Service: 02/08/2018 Medical Record Number: [id number] Patient Account Number: [id number] Date of Birth/Sex: [birth year] (32 y.o. F) Treating RN: Cornell Barman Primary Care Provider: [name] Other Clinician: Referring Provider: [name] Treating Provider/Extender: Oneida Arenas in Treatment: 9 Diagnosis Coding ICD-10 Codes Code Description E11.621 Type 2 diabetes mellitus with foot ulcer L97.522 Non-pressure chronic ulcer of other part of left foot with fat layer exposed Z94.0 Kidney transplant status I10 Essential (primary) hypertension Facility Procedures CPT4 Code: 86767209 Description: [phone] - WOUND CARE VISIT-LEV 2 EST PT Modifier: Quantity: 1 Physician Procedures CPT4 Code: [id number] Description: 47654 - WC PHYS LEVEL 2 - EST PT ICD-10 Diagnosis Description L97.522 Non-pressure chronic ulcer of other part of left foot with fat Modifier: layer exposed Quantity: 1 Electronic Signature(s) Signed: 02/12/2018 5:29:42 PM By: Beather Arbour FNP-C Entered By: Beather Arbour on 02/08/2018 15:28:03

## 2018-02-13 NOTE — Progress Notes (Addendum)
SHAKEYA, KERKMAN ([id number]) Visit Report for 02/08/2018 Arrival Information Details Patient Name: [name], [name] Date of Service: 02/08/2018 2:30 PM Medical Record Number: [id number] Patient Account Number: [id number] Date of Birth/Sex: Nov 30, 1985 (32 y.o. F) Treating RN: Cornell Barman Primary Care Marqus Macphee: [name] Other Clinician: Referring Onesimo Lingard: [name] Treating Mykell Rawl/Extender: Oneida Arenas in Treatment: 9 Visit Information History Since Last Visit Added or deleted any medications: No Patient Arrived: Ambulatory Any new allergies or adverse reactions: No Arrival Time: 14:41 Had a fall or experienced change in No Accompanied By: self activities of daily living that may affect Transfer Assistance: None risk of falls: Patient Identification Verified: Yes Signs or symptoms of abuse/neglect since last visito No Secondary Verification Process Yes Hospitalized since last visit: No Completed: Implantable device outside of the clinic excluding No Patient Has Alerts: Yes cellular tissue based products placed in the center Patient Alerts: ABI 02/14/17 L 1.07 R since last visit: 1.11 Has Dressing in Place as Prescribed: Yes Pain Present Now: No Electronic Signature(s) Signed: 02/08/2018 3:32:12 PM By: Lorine Bears RCP, RRT, CHT Entered By: Lorine Bears on 02/08/2018 14:41:45 [name] ([id number]) -------------------------------------------------------------------------------- Clinic Level of Care Assessment Details Patient Name: [name] Date of Service: 02/08/2018 2:30 PM Medical Record Number: [id number] Patient Account Number: [id number] Date of Birth/Sex: [birth year] (32 y.o. F) Treating RN: Cornell Barman Primary Care Kilah Drahos: [name] Other Clinician: Referring Tyiesha Brackney: [name] Treating Bella Brummet/Extender: Oneida Arenas in Treatment: 9 Clinic Level of Care Assessment Items TOOL 4  Quantity Score []  - Use when only an EandM is performed on FOLLOW-UP visit 0 ASSESSMENTS - Nursing Assessment / Reassessment []  - Reassessment of Co-morbidities (includes updates in patient status) 0 X- 1 5 Reassessment of Adherence to Treatment Plan ASSESSMENTS - Wound and Skin Assessment / Reassessment X - Simple Wound Assessment / Reassessment - one wound 1 5 []  - 0 Complex Wound Assessment / Reassessment - multiple wounds []  - 0 Dermatologic / Skin Assessment (not related to wound area) ASSESSMENTS - Focused Assessment []  - Circumferential Edema Measurements - multi extremities 0 []  - 0 Nutritional Assessment / Counseling / Intervention []  - 0 Lower Extremity Assessment (monofilament, tuning fork, pulses) []  - 0 Peripheral Arterial Disease Assessment (using hand held doppler) ASSESSMENTS - Ostomy and/or Continence Assessment and Care []  - Incontinence Assessment and Management 0 []  - 0 Ostomy Care Assessment and Management (repouching, etc.) PROCESS - Coordination of Care X - Simple Patient / Family Education for ongoing care 1 15 []  - 0 Complex (extensive) Patient / Family Education for ongoing care []  - 0 Staff obtains Programmer, systems, Records, Test Results / Process Orders []  - 0 Staff telephones HHA, Nursing Homes / Clarify orders / etc []  - 0 Routine Transfer to another Facility (non-emergent condition) []  - 0 Routine Hospital Admission (non-emergent condition) []  - 0 New Admissions / Biomedical engineer / Ordering NPWT, Apligraf, etc. []  - 0 Emergency Hospital Admission (emergent condition) X- 1 10 Simple Discharge Coordination [name], [name] ([id number]) []  - 0 Complex (extensive) Discharge Coordination PROCESS - Special Needs []  - Pediatric / Minor Patient Management 0 []  - 0 Isolation Patient Management []  - 0 Hearing / Language / Visual special needs []  - 0 Assessment of Community assistance (transportation, D/C planning, etc.) []  - 0 Additional  assistance / Altered mentation []  - 0 Support Surface(s) Assessment (bed, cushion, seat, etc.) INTERVENTIONS - Wound Cleansing / Measurement X - Simple Wound Cleansing - one wound 1 5 []  -  0 Complex Wound Cleansing - multiple wounds X- 1 5 Wound Imaging (photographs - any number of wounds) []  - 0 Wound Tracing (instead of photographs) X- 1 5 Simple Wound Measurement - one wound []  - 0 Complex Wound Measurement - multiple wounds INTERVENTIONS - Wound Dressings []  - Small Wound Dressing one or multiple wounds 0 X- 1 15 Medium Wound Dressing one or multiple wounds []  - 0 Large Wound Dressing one or multiple wounds []  - 0 Application of Medications - topical []  - 0 Application of Medications - injection INTERVENTIONS - Miscellaneous []  - External ear exam 0 []  - 0 Specimen Collection (cultures, biopsies, blood, body fluids, etc.) []  - 0 Specimen(s) / Culture(s) sent or taken to Lab for analysis []  - 0 Patient Transfer (multiple staff / Civil Service fast streamer / Similar devices) []  - 0 Simple Staple / Suture removal (25 or less) []  - 0 Complex Staple / Suture removal (26 or more) []  - 0 Hypo / Hyperglycemic Management (close monitor of Blood Glucose) []  - 0 Ankle / Brachial Index (ABI) - do not check if billed separately X- 1 5 Vital Signs [name], [name] ([id number]) Has the patient been seen at the hospital within the last three years: Yes Total Score: 70 Level Of Care: New/Established - Level 2 Electronic Signature(s) Signed: 02/08/2018 4:25:08 PM By: Gretta Cool, BSN, RN, CWS, Kim RN, BSN Entered By: Gretta Cool, BSN, RN, CWS, Kim on 02/08/2018 15:06:08 [name] ([id number]) -------------------------------------------------------------------------------- Encounter Discharge Information Details Patient Name: [name] Date of Service: 02/08/2018 2:30 PM Medical Record Number: [id number] Patient Account Number: [id number] Date of Birth/Sex: 03-Feb-1986 (32 y.o. F) Treating RN:  Cornell Barman Primary Care Kortnee Bas: [name] Other Clinician: Referring Stormee Duda: [name] Treating Christen Bedoya/Extender: Oneida Arenas in Treatment: 9 Encounter Discharge Information Items Discharge Condition: Stable Ambulatory Status: Ambulatory Discharge Destination: Home Transportation: Private Auto Accompanied By: self Schedule Follow-up Appointment: Yes Clinical Summary of Care: Electronic Signature(s) Signed: 02/08/2018 4:25:08 PM By: Gretta Cool, BSN, RN, CWS, Kim RN, BSN Entered By: Gretta Cool, BSN, RN, CWS, Kim on 02/08/2018 15:08:11 [name] ([id number]) -------------------------------------------------------------------------------- Lower Extremity Assessment Details Patient Name: [name] Date of Service: 02/08/2018 2:30 PM Medical Record Number: [id number] Patient Account Number: [id number] Date of Birth/Sex: [birth year] (32 y.o. F) Treating RN: Montey Hora Primary Care Bernetta Sutley: [name] Other Clinician: Referring Green Quincy: [name] Treating Michal Callicott/Extender: Oneida Arenas in Treatment: 9 Edema Assessment Assessed: [Left: No] Patrice Paradise: No] [Left: Edema] [Right: :] Calf Left: Right: Point of Measurement: 32 cm From Medial Instep 38.1 cm cm Ankle Left: Right: Point of Measurement: 10 cm From Medial Instep 21 cm cm Vascular Assessment Pulses: Dorsalis Pedis Palpable: [Left:Yes] Posterior Tibial Extremity colors, hair growth, and conditions: Extremity Color: [Left:Hyperpigmented] Hair Growth on Extremity: [Left:Yes] Temperature of Extremity: [Left:Warm] Capillary Refill: [Left:< 3 seconds] Toe Nail Assessment Left: Right: Thick: Yes Discolored: Yes Deformed: No Improper Length and Hygiene: Yes Electronic Signature(s) Signed: 02/08/2018 3:52:47 PM By: Montey Hora Entered By: Montey Hora on 02/08/2018 14:51:22 [name]  ([id number]) -------------------------------------------------------------------------------- Multi Wound Chart Details Patient Name: [name] Date of Service: 02/08/2018 2:30 PM Medical Record Number: [id number] Patient Account Number: [id number] Date of Birth/Sex: 1985-12-20 (32 y.o. F) Treating RN: Cornell Barman Primary Care Tijah Hane: [name] Other Clinician: Referring Isabell Bonafede: [name] Treating Delshon Blanchfield/Extender: Oneida Arenas in Treatment: 9 Vital Signs Height(in): 66 Pulse(bpm): 95 Weight(lbs): 174 Blood Pressure(mmHg): 119/83 Body Mass Index(BMI): 28 Temperature(F): 98.5 Respiratory Rate 16 (breaths/min): Photos: [N/A:N/A] Wound  Location: Left Toe Great N/A N/A Wounding Event: Gradually Appeared N/A N/A Primary Etiology: Diabetic Wound/Ulcer of the N/A N/A Lower Extremity Comorbid History: Anemia, Hypertension, Type II N/A N/A Diabetes, Osteoarthritis, Neuropathy, Seizure Disorder Date Acquired: 11/07/2017 N/A N/A Weeks of Treatment: 9 N/A N/A Wound Status: Open N/A N/A Pending Amputation on Yes N/A N/A Presentation: Measurements L x W x D 0.1x0.1x0.1 N/A N/A (cm) Area (cm) : 0.008 N/A N/A Volume (cm) : 0.001 N/A N/A % Reduction in Area: 99.70% N/A N/A % Reduction in Volume: 99.90% N/A N/A Classification: Grade 1 N/A N/A Exudate Amount: None Present N/A N/A Wound Margin: Flat and Intact N/A N/A Granulation Amount: None Present (0%) N/A N/A Necrotic Amount: None Present (0%) N/A N/A Exposed Structures: Fat Layer (Subcutaneous N/A N/A Tissue) Exposed: Yes Fascia: No Tendon: No Muscle: No [name], [name] ([id number]) Joint: No Bone: No Epithelialization: Large (67-100%) N/A N/A Periwound Skin Texture: Scarring: Yes N/A N/A Excoriation: No Induration: No Callus: No Crepitus: No Rash: No Periwound Skin Moisture: Maceration: No N/A N/A Dry/Scaly: No Periwound Skin Color: Atrophie Blanche: No N/A N/A Cyanosis:  No Ecchymosis: No Erythema: No Hemosiderin Staining: No Mottled: No Pallor: No Rubor: No Temperature: No Abnormality N/A N/A Tenderness on Palpation: No N/A N/A Wound Preparation: Ulcer Cleansing: N/A N/A Rinsed/Irrigated with Saline Topical Anesthetic Applied: None Treatment Notes Electronic Signature(s) Signed: 02/12/2018 5:29:42 PM By: Beather Arbour FNP-C Entered By: Beather Arbour on 02/08/2018 15:12:11 [name] ([id number]) -------------------------------------------------------------------------------- [place] Details Patient Name: [name] Date of Service: 02/08/2018 2:30 PM Medical Record Number: [id number] Patient Account Number: [id number] Date of Birth/Sex: 1986-01-16 (32 y.o. F) Treating RN: Cornell Barman Primary Care Linsey Hirota: [name] Other Clinician: Referring Rushi Chasen: [name] Treating Jersie Beel/Extender: Oneida Arenas in Treatment: 9 Active Inactive Electronic Signature(s) Signed: 03/30/2018 7:35:12 AM By: Gretta Cool, BSN, RN, CWS, Kim RN, BSN Previous Signature: 02/08/2018 4:25:08 PM Version By: Gretta Cool, BSN, RN, CWS, Kim RN, BSN Entered By: Gretta Cool, BSN, RN, CWS, Kim on 03/30/2018 07:35:12 [name] ([id number]) -------------------------------------------------------------------------------- Pain Assessment Details Patient Name: [name] Date of Service: 02/08/2018 2:30 PM Medical Record Number: [id number] Patient Account Number: [id number] Date of Birth/Sex: November 01, 1985 (32 y.o. F) Treating RN: Cornell Barman Primary Care Vint Pola: [name] Other Clinician: Referring Jensyn Cambria: [name] Treating Malyah Ohlrich/Extender: Oneida Arenas in Treatment: 9 Active Problems Location of Pain Severity and Description of Pain Patient Has Paino No Site Locations Pain Management and Medication Current Pain Management: Electronic Signature(s) Signed: 02/08/2018 3:32:12 PM By: Lorine Bears RCP, RRT, CHT Signed: 02/08/2018 4:25:08 PM By: Gretta Cool, BSN, RN, CWS, Kim RN, BSN Entered By: Lorine Bears on 02/08/2018 14:41:52 [name] ([id number]) -------------------------------------------------------------------------------- Patient/Caregiver Education Details Patient Name: [name] Date of Service: 02/08/2018 2:30 PM Medical Record Number: [id number] Patient Account Number: [id number] Date of Birth/Gender: [birth year] (32 y.o. F) Treating RN: Cornell Barman Primary Care Physician: [name] Other Clinician: Referring Physician: [name] Treating Physician/Extender: Oneida Arenas in Treatment: 9 Education Assessment Education Provided To: Patient Education Topics Provided Wound/Skin Impairment: Handouts: Caring for Your Ulcer Methods: Demonstration, Explain/Verbal Responses: State content correctly Electronic Signature(s) Signed: 02/08/2018 4:25:08 PM By: Gretta Cool, BSN, RN, CWS, Kim RN, BSN Entered By: Gretta Cool, BSN, RN, CWS, Kim on 02/08/2018 15:06:52 [name] ([id number]) -------------------------------------------------------------------------------- Wound Assessment Details Patient Name: [name] Date of Service: 02/08/2018 2:30 PM Medical Record Number: [id number] Patient Account Number: [id number] Date of Birth/Sex: 01/31/1986 (32 y.o. F) Treating RN:  Montey Hora Primary Care Estera Ozier: [name] Other Clinician: Referring Torrie Namba: [name] Treating Caleyah Jr/Extender: Beather Arbour Weeks in Treatment: 9 Wound Status Wound Number: 4 Primary Diabetic Wound/Ulcer of the Lower Extremity Etiology: Wound Location: Left Toe Great Wound Open Wounding Event: Gradually Appeared Status: Date Acquired: 11/07/2017 Comorbid Anemia, Hypertension, Type II Diabetes, Weeks Of Treatment: 9 History: Osteoarthritis, Neuropathy, Seizure Disorder Clustered Wound: No Pending Amputation On  Presentation Photos Photo Uploaded By: Montey Hora on 02/08/2018 14:58:11 Wound Measurements Length: (cm) 0.1 Width: (cm) 0.1 Depth: (cm) 0.1 Area: (cm) 0.008 Volume: (cm) 0.001 % Reduction in Area: 99.7% % Reduction in Volume: 99.9% Epithelialization: Large (67-100%) Wound Description Classification: Grade 1 Wound Margin: Flat and Intact Exudate Amount: None Present Foul Odor After Cleansing: No Slough/Fibrino Yes Wound Bed Granulation Amount: None Present (0%) Exposed Structure Necrotic Amount: None Present (0%) Fascia Exposed: No Fat Layer (Subcutaneous Tissue) Exposed: Yes Tendon Exposed: No Muscle Exposed: No Joint Exposed: No Bone Exposed: No Periwound Skin Texture Texture Color [name], [name] ([id number]) No Abnormalities Noted: No No Abnormalities Noted: No Callus: No Atrophie Blanche: No Crepitus: No Cyanosis: No Excoriation: No Ecchymosis: No Induration: No Erythema: No Rash: No Hemosiderin Staining: No Scarring: Yes Mottled: No Pallor: No Moisture Rubor: No No Abnormalities Noted: No Dry / Scaly: No Temperature / Pain Maceration: No Temperature: No Abnormality Wound Preparation Ulcer Cleansing: Rinsed/Irrigated with Saline Topical Anesthetic Applied: None Electronic Signature(s) Signed: 02/08/2018 3:52:47 PM By: Montey Hora Entered By: Montey Hora on 02/08/2018 14:49:58 [name] ([id number]) -------------------------------------------------------------------------------- [place] Details Patient Name: [name] Date of Service: 02/08/2018 2:30 PM Medical Record Number: [id number] Patient Account Number: [id number] Date of Birth/Sex: [birth year] (32 y.o. F) Treating RN: Cornell Barman Primary Care Raider Valbuena: [name] Other Clinician: Referring Erlinda Solinger: [name] Treating Andersson Larrabee/Extender: Oneida Arenas in Treatment: 9 Vital Signs Time Taken: 15:38 Temperature (F): 98.5 Height (in): 66 Pulse (bpm):  95 Weight (lbs): 174 Respiratory Rate (breaths/min): 16 Body Mass Index (BMI): 28.1 Blood Pressure (mmHg): 119/83 Reference Range: 80 - 120 mg / dl Electronic Signature(s) Signed: 02/08/2018 3:32:12 PM By: Lorine Bears RCP, RRT, CHT Entered By: Lorine Bears on 02/08/2018 14:43:55

## 2018-02-16 DIAGNOSIS — D631 Anemia in chronic kidney disease: Secondary | ICD-10-CM | POA: Diagnosis not present

## 2018-02-16 DIAGNOSIS — T8691 Unspecified transplanted organ and tissue rejection: Secondary | ICD-10-CM | POA: Diagnosis not present

## 2018-02-16 DIAGNOSIS — N059 Unspecified nephritic syndrome with unspecified morphologic changes: Secondary | ICD-10-CM | POA: Diagnosis not present

## 2018-02-16 DIAGNOSIS — Z940 Kidney transplant status: Secondary | ICD-10-CM | POA: Diagnosis not present

## 2018-02-16 DIAGNOSIS — T8619 Other complication of kidney transplant: Secondary | ICD-10-CM | POA: Diagnosis not present

## 2018-02-16 DIAGNOSIS — E139 Other specified diabetes mellitus without complications: Secondary | ICD-10-CM | POA: Diagnosis not present

## 2018-02-16 DIAGNOSIS — E872 Acidosis: Secondary | ICD-10-CM | POA: Diagnosis not present

## 2018-02-16 DIAGNOSIS — I10 Essential (primary) hypertension: Secondary | ICD-10-CM | POA: Diagnosis not present

## 2018-02-16 DIAGNOSIS — N189 Chronic kidney disease, unspecified: Secondary | ICD-10-CM | POA: Diagnosis not present

## 2018-02-16 DIAGNOSIS — E11621 Type 2 diabetes mellitus with foot ulcer: Secondary | ICD-10-CM | POA: Diagnosis not present

## 2018-02-20 ENCOUNTER — Encounter (HOSPITAL_COMMUNITY): Payer: Medicare Other

## 2018-02-22 ENCOUNTER — Ambulatory Visit: Payer: Medicare Other | Admitting: Internal Medicine

## 2018-02-23 ENCOUNTER — Other Ambulatory Visit: Payer: Self-pay

## 2018-02-23 DIAGNOSIS — N184 Chronic kidney disease, stage 4 (severe): Secondary | ICD-10-CM

## 2018-02-27 ENCOUNTER — Ambulatory Visit (INDEPENDENT_AMBULATORY_CARE_PROVIDER_SITE_OTHER)
Admission: RE | Admit: 2018-02-27 | Discharge: 2018-02-27 | Disposition: A | Discharge status: Home / Self Care | Payer: Medicare Other | Source: Ambulatory Visit | Attending: Family | Admitting: Family

## 2018-02-27 DIAGNOSIS — N179 Acute kidney failure, unspecified: Secondary | ICD-10-CM | POA: Diagnosis not present

## 2018-02-27 DIAGNOSIS — N184 Chronic kidney disease, stage 4 (severe): Secondary | ICD-10-CM

## 2018-02-27 DIAGNOSIS — I129 Hypertensive chronic kidney disease with stage 1 through stage 4 chronic kidney disease, or unspecified chronic kidney disease: Secondary | ICD-10-CM | POA: Diagnosis not present

## 2018-02-27 DIAGNOSIS — R600 Localized edema: Secondary | ICD-10-CM | POA: Diagnosis not present

## 2018-02-27 DIAGNOSIS — R079 Chest pain, unspecified: Secondary | ICD-10-CM | POA: Diagnosis not present

## 2018-02-27 DIAGNOSIS — Z940 Kidney transplant status: Secondary | ICD-10-CM | POA: Diagnosis not present

## 2018-02-27 DIAGNOSIS — L0390 Cellulitis, unspecified: Secondary | ICD-10-CM | POA: Diagnosis not present

## 2018-02-27 DIAGNOSIS — T8619 Other complication of kidney transplant: Secondary | ICD-10-CM | POA: Diagnosis not present

## 2018-02-27 DIAGNOSIS — R0602 Shortness of breath: Secondary | ICD-10-CM | POA: Diagnosis not present

## 2018-02-27 DIAGNOSIS — R6520 Severe sepsis without septic shock: Secondary | ICD-10-CM | POA: Diagnosis not present

## 2018-02-27 DIAGNOSIS — L03116 Cellulitis of left lower limb: Secondary | ICD-10-CM | POA: Diagnosis not present

## 2018-02-27 DIAGNOSIS — A419 Sepsis, unspecified organism: Secondary | ICD-10-CM | POA: Diagnosis not present

## 2018-02-28 ENCOUNTER — Ambulatory Visit (INDEPENDENT_AMBULATORY_CARE_PROVIDER_SITE_OTHER): Payer: Medicare Other | Admitting: Vascular Surgery

## 2018-02-28 ENCOUNTER — Other Ambulatory Visit: Payer: Self-pay

## 2018-02-28 ENCOUNTER — Encounter: Payer: Self-pay | Admitting: Vascular Surgery

## 2018-02-28 VITALS — BP 146/99 | HR 90 | Temp 98.0°F | Resp 20 | Ht 66.0 in | Wt 189.0 lb

## 2018-02-28 DIAGNOSIS — N189 Chronic kidney disease, unspecified: Secondary | ICD-10-CM
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
## 2018-03-01 ENCOUNTER — Emergency Department (HOSPITAL_COMMUNITY): Payer: Medicare Other

## 2018-03-01 ENCOUNTER — Encounter (HOSPITAL_COMMUNITY): Payer: Self-pay | Admitting: Emergency Medicine

## 2018-03-01 ENCOUNTER — Other Ambulatory Visit: Payer: Self-pay

## 2018-03-01 ENCOUNTER — Ambulatory Visit: Payer: Medicare Other | Admitting: Internal Medicine

## 2018-03-01 ENCOUNTER — Emergency Department (HOSPITAL_COMMUNITY)
Admit: 2018-03-01 | Discharge: 2018-03-01 | Disposition: A | Discharge status: Home / Self Care | Payer: Medicare Other | Attending: Emergency Medicine | Admitting: Emergency Medicine

## 2018-03-01 ENCOUNTER — Inpatient Hospital Stay (HOSPITAL_COMMUNITY)
Admission: EM | Admit: 2018-03-01 | Discharge: 2018-03-06 | DRG: 872 | Disposition: A | Discharge status: Home / Self Care | Payer: Medicare Other | Attending: Internal Medicine | Admitting: Internal Medicine

## 2018-03-01 DIAGNOSIS — Z886 Allergy status to analgesic agent status: Secondary | ICD-10-CM

## 2018-03-01 DIAGNOSIS — E108 Type 1 diabetes mellitus with unspecified complications: Secondary | ICD-10-CM

## 2018-03-01 DIAGNOSIS — L0390 Cellulitis, unspecified: Secondary | ICD-10-CM

## 2018-03-01 DIAGNOSIS — Y830 Surgical operation with transplant of whole organ as the cause of abnormal reaction of the patient, or of later complication, without mention of misadventure at the time of the procedure: Secondary | ICD-10-CM | POA: Diagnosis present

## 2018-03-01 DIAGNOSIS — Z794 Long term (current) use of insulin: Secondary | ICD-10-CM

## 2018-03-01 DIAGNOSIS — N184 Chronic kidney disease, stage 4 (severe): Secondary | ICD-10-CM | POA: Diagnosis not present

## 2018-03-01 DIAGNOSIS — Z6830 Body mass index (BMI) 30.0-30.9, adult: Secondary | ICD-10-CM

## 2018-03-01 DIAGNOSIS — R000 Tachycardia, unspecified: Secondary | ICD-10-CM | POA: Diagnosis not present

## 2018-03-01 DIAGNOSIS — Z881 Allergy status to other antibiotic agents status: Secondary | ICD-10-CM

## 2018-03-01 DIAGNOSIS — E876 Hypokalemia: Secondary | ICD-10-CM | POA: Diagnosis not present

## 2018-03-01 DIAGNOSIS — N179 Acute kidney failure, unspecified: Secondary | ICD-10-CM | POA: Diagnosis not present

## 2018-03-01 DIAGNOSIS — A419 Sepsis, unspecified organism: Principal | ICD-10-CM | POA: Diagnosis present

## 2018-03-01 DIAGNOSIS — N178 Other acute kidney failure: Secondary | ICD-10-CM

## 2018-03-01 DIAGNOSIS — Z79899 Other long term (current) drug therapy: Secondary | ICD-10-CM | POA: Diagnosis not present

## 2018-03-01 DIAGNOSIS — L03116 Cellulitis of left lower limb: Secondary | ICD-10-CM | POA: Diagnosis present

## 2018-03-01 DIAGNOSIS — D631 Anemia in chronic kidney disease: Secondary | ICD-10-CM | POA: Diagnosis not present

## 2018-03-01 DIAGNOSIS — R609 Edema, unspecified: Secondary | ICD-10-CM | POA: Diagnosis not present

## 2018-03-01 DIAGNOSIS — R6520 Severe sepsis without septic shock: Secondary | ICD-10-CM

## 2018-03-01 DIAGNOSIS — Z940 Kidney transplant status: Secondary | ICD-10-CM | POA: Diagnosis not present

## 2018-03-01 DIAGNOSIS — R079 Chest pain, unspecified: Secondary | ICD-10-CM | POA: Diagnosis not present

## 2018-03-01 DIAGNOSIS — E872 Acidosis, unspecified: Secondary | ICD-10-CM | POA: Diagnosis present

## 2018-03-01 DIAGNOSIS — R0602 Shortness of breath: Secondary | ICD-10-CM | POA: Diagnosis not present

## 2018-03-01 DIAGNOSIS — Z9119 Patient's noncompliance with other medical treatment and regimen: Secondary | ICD-10-CM | POA: Diagnosis not present

## 2018-03-01 DIAGNOSIS — F819 Developmental disorder of scholastic skills, unspecified: Secondary | ICD-10-CM | POA: Diagnosis present

## 2018-03-01 DIAGNOSIS — E1122 Type 2 diabetes mellitus with diabetic chronic kidney disease: Secondary | ICD-10-CM | POA: Diagnosis not present

## 2018-03-01 DIAGNOSIS — M7989 Other specified soft tissue disorders: Secondary | ICD-10-CM | POA: Diagnosis not present

## 2018-03-01 DIAGNOSIS — Z91013 Allergy to seafood: Secondary | ICD-10-CM

## 2018-03-01 DIAGNOSIS — E1165 Type 2 diabetes mellitus with hyperglycemia: Secondary | ICD-10-CM | POA: Diagnosis present

## 2018-03-01 DIAGNOSIS — E1065 Type 1 diabetes mellitus with hyperglycemia: Secondary | ICD-10-CM | POA: Diagnosis not present

## 2018-03-01 DIAGNOSIS — E1143 Type 2 diabetes mellitus with diabetic autonomic (poly)neuropathy: Secondary | ICD-10-CM | POA: Diagnosis present

## 2018-03-01 DIAGNOSIS — Z6832 Body mass index (BMI) 32.0-32.9, adult: Secondary | ICD-10-CM | POA: Diagnosis not present

## 2018-03-01 DIAGNOSIS — K3184 Gastroparesis: Secondary | ICD-10-CM | POA: Diagnosis not present

## 2018-03-01 DIAGNOSIS — Z7952 Long term (current) use of systemic steroids: Secondary | ICD-10-CM

## 2018-03-01 DIAGNOSIS — E109 Type 1 diabetes mellitus without complications: Secondary | ICD-10-CM | POA: Diagnosis present

## 2018-03-01 DIAGNOSIS — N189 Chronic kidney disease, unspecified: Secondary | ICD-10-CM | POA: Diagnosis present

## 2018-03-01 DIAGNOSIS — Z888 Allergy status to other drugs, medicaments and biological substances status: Secondary | ICD-10-CM | POA: Diagnosis not present

## 2018-03-01 DIAGNOSIS — E785 Hyperlipidemia, unspecified: Secondary | ICD-10-CM | POA: Diagnosis not present

## 2018-03-01 DIAGNOSIS — Z91018 Allergy to other foods: Secondary | ICD-10-CM

## 2018-03-01 DIAGNOSIS — E669 Obesity, unspecified: Secondary | ICD-10-CM | POA: Diagnosis present

## 2018-03-01 DIAGNOSIS — E8770 Fluid overload, unspecified: Secondary | ICD-10-CM | POA: Diagnosis present

## 2018-03-01 DIAGNOSIS — R52 Pain, unspecified: Secondary | ICD-10-CM | POA: Diagnosis not present

## 2018-03-01 DIAGNOSIS — I10 Essential (primary) hypertension: Secondary | ICD-10-CM | POA: Diagnosis not present

## 2018-03-01 DIAGNOSIS — I129 Hypertensive chronic kidney disease with stage 1 through stage 4 chronic kidney disease, or unspecified chronic kidney disease: Secondary | ICD-10-CM | POA: Diagnosis present

## 2018-03-01 DIAGNOSIS — R600 Localized edema: Secondary | ICD-10-CM | POA: Diagnosis not present

## 2018-03-01 DIAGNOSIS — IMO0002 Reserved for concepts with insufficient information to code with codable children: Secondary | ICD-10-CM | POA: Diagnosis present

## 2018-03-01 DIAGNOSIS — T8619 Other complication of kidney transplant: Secondary | ICD-10-CM | POA: Diagnosis present

## 2018-03-01 HISTORY — DX: Cellulitis of left lower limb: L03.116

## 2018-03-01 LAB — COMPREHENSIVE METABOLIC PANEL
ALT: 25 U/L (ref 0–44)
ALT: 21 U/L (ref 0–44)
AST: 17 U/L (ref 15–41)
AST: 15 U/L (ref 15–41)
Albumin: 3 g/dL — ABNORMAL LOW (ref 3.5–5.0)
Albumin: 2.4 g/dL — ABNORMAL LOW (ref 3.5–5.0)
Alkaline Phosphatase: 39 U/L (ref 38–126)
Alkaline Phosphatase: 32 U/L — ABNORMAL LOW (ref 38–126)
Anion gap: 12 (ref 5–15)
Anion gap: 12 (ref 5–15)
BUN: 108 mg/dL — AB (ref 6–20)
BUN: 104 mg/dL — ABNORMAL HIGH (ref 6–20)
CHLORIDE: 113 mmol/L — AB (ref 98–111)
CO2: 13 mmol/L — AB (ref 22–32)
CO2: 12 mmol/L — ABNORMAL LOW (ref 22–32)
CREATININE: 5.66 mg/dL — AB (ref 0.44–1.00)
Calcium: 4.6 mg/dL — CL (ref 8.9–10.3)
Calcium: 4.3 mg/dL — CL (ref 8.9–10.3)
Chloride: 115 mmol/L — ABNORMAL HIGH (ref 98–111)
Creatinine, Ser: 5.28 mg/dL — ABNORMAL HIGH (ref 0.44–1.00)
GFR calc Af Amer: 10 mL/min — ABNORMAL LOW (ref >60)
GFR calc Af Amer: 11 mL/min — ABNORMAL LOW (ref >60)
GFR calc non Af Amer: 9 mL/min — ABNORMAL LOW (ref >60)
GFR calc non Af Amer: 10 mL/min — ABNORMAL LOW (ref >60)
Glucose, Bld: 230 mg/dL — ABNORMAL HIGH (ref 70–99)
Glucose, Bld: 172 mg/dL — ABNORMAL HIGH (ref 70–99)
POTASSIUM: 3 mmol/L — AB (ref 3.5–5.1)
Potassium: 3.6 mmol/L (ref 3.5–5.1)
SODIUM: 138 mmol/L (ref 135–145)
Sodium: 139 mmol/L (ref 135–145)
TOTAL PROTEIN: 5.4 g/dL — AB (ref 6.5–8.1)
Total Bilirubin: 0.7 mg/dL (ref 0.3–1.2)
Total Bilirubin: 0.4 mg/dL (ref 0.3–1.2)
Total Protein: 6.2 g/dL — ABNORMAL LOW (ref 6.5–8.1)

## 2018-03-01 LAB — CBC WITH DIFFERENTIAL/PLATELET
ABS IMMATURE GRANULOCYTES: 0.1 10*3/uL — AB (ref 0.00–0.07)
Abs Immature Granulocytes: 0.11 10*3/uL — ABNORMAL HIGH (ref 0.00–0.07)
BASOS ABS: 0 10*3/uL (ref 0.0–0.1)
Basophils Absolute: 0 10*3/uL (ref 0.0–0.1)
Basophils Relative: 0 %
Basophils Relative: 0 %
EOS PCT: 1 %
EOS PCT: 1 %
Eosinophils Absolute: 0.2 10*3/uL (ref 0.0–0.5)
Eosinophils Absolute: 0.1 10*3/uL (ref 0.0–0.5)
HCT: 30.4 % — ABNORMAL LOW (ref 36.0–46.0)
HCT: 27.2 % — ABNORMAL LOW (ref 36.0–46.0)
Hemoglobin: 9.4 g/dL — ABNORMAL LOW (ref 12.0–15.0)
Hemoglobin: 8.2 g/dL — ABNORMAL LOW (ref 12.0–15.0)
Immature Granulocytes: 1 %
Immature Granulocytes: 1 %
LYMPHS ABS: 2.8 10*3/uL (ref 0.7–4.0)
Lymphocytes Relative: 18 %
Lymphocytes Relative: 10 %
Lymphs Abs: 1.4 10*3/uL (ref 0.7–4.0)
MCH: 29.2 pg (ref 26.0–34.0)
MCH: 28.6 pg (ref 26.0–34.0)
MCHC: 30.9 g/dL (ref 30.0–36.0)
MCHC: 30.1 g/dL (ref 30.0–36.0)
MCV: 94.4 fL (ref 80.0–100.0)
MCV: 94.8 fL (ref 80.0–100.0)
MONO ABS: 0.5 10*3/uL (ref 0.1–1.0)
Monocytes Absolute: 0.5 10*3/uL (ref 0.1–1.0)
Monocytes Relative: 3 %
Monocytes Relative: 3 %
NEUTROS ABS: 11.5 10*3/uL — AB (ref 1.7–7.7)
NRBC: 0 % (ref 0.0–0.2)
Neutro Abs: 12.3 10*3/uL — ABNORMAL HIGH (ref 1.7–7.7)
Neutrophils Relative %: 77 %
Neutrophils Relative %: 85 %
PLATELETS: 162 10*3/uL (ref 150–400)
PLATELETS: 146 10*3/uL — AB (ref 150–400)
RBC: 3.22 MIL/uL — AB (ref 3.87–5.11)
RBC: 2.87 MIL/uL — ABNORMAL LOW (ref 3.87–5.11)
RDW: 14.1 % (ref 11.5–15.5)
RDW: 14.2 % (ref 11.5–15.5)
WBC: 15.1 10*3/uL — ABNORMAL HIGH (ref 4.0–10.5)
WBC: 14.4 10*3/uL — ABNORMAL HIGH (ref 4.0–10.5)
nRBC: 0 % (ref 0.0–0.2)

## 2018-03-01 LAB — GLUCOSE, CAPILLARY
Glucose-Capillary: 163 mg/dL — ABNORMAL HIGH (ref 70–99)
Glucose-Capillary: 209 mg/dL — ABNORMAL HIGH (ref 70–99)

## 2018-03-01 LAB — URINALYSIS, ROUTINE W REFLEX MICROSCOPIC
Bilirubin Urine: NEGATIVE
Ketones, ur: NEGATIVE mg/dL
Nitrite: NEGATIVE
Protein, ur: 100 mg/dL — AB
SPECIFIC GRAVITY, URINE: 1.007 (ref 1.005–1.030)
pH: 5 (ref 5.0–8.0)

## 2018-03-01 LAB — BRAIN NATRIURETIC PEPTIDE: B Natriuretic Peptide: 63.3 pg/mL (ref 0.0–100.0)

## 2018-03-01 LAB — TSH: TSH: 0.89 u[IU]/mL (ref 0.350–4.500)

## 2018-03-01 LAB — PROCALCITONIN: Procalcitonin: 4.31 ng/mL

## 2018-03-01 LAB — PHOSPHORUS: Phosphorus: 5.2 mg/dL — ABNORMAL HIGH (ref 2.5–4.6)

## 2018-03-01 LAB — POC URINE PREG, ED: Preg Test, Ur: NEGATIVE

## 2018-03-01 LAB — MAGNESIUM: Magnesium: 0.9 mg/dL — CL (ref 1.7–2.4)

## 2018-03-01 LAB — I-STAT CG4 LACTIC ACID, ED
LACTIC ACID, VENOUS: 0.99 mmol/L (ref 0.5–1.9)
Lactic Acid, Venous: 0.74 mmol/L (ref 0.5–1.9)

## 2018-03-01 LAB — MRSA PCR SCREENING: MRSA by PCR: NEGATIVE

## 2018-03-01 MED ORDER — ONDANSETRON HCL 4 MG/2ML IJ SOLN: 4.0000 mg | Freq: Four times a day (QID) | INTRAMUSCULAR | Status: DC | PRN

## 2018-03-01 MED ORDER — CALCIUM GLUCONATE-NACL 2-0.675 GM/100ML-% IV SOLN
2.0000 g | Freq: Once | INTRAVENOUS | Status: AC
  Administered 2018-03-01: 2000 mg via INTRAVENOUS
  Filled 2018-03-01: qty 100

## 2018-03-01 MED ORDER — SENNOSIDES-DOCUSATE SODIUM 8.6-50 MG PO TABS: 1.0000 | ORAL_TABLET | Freq: Every evening | ORAL | Status: DC | PRN

## 2018-03-01 MED ORDER — PIPERACILLIN-TAZOBACTAM 3.375 G IVPB 30 MIN
3.3750 g | Freq: Once | INTRAVENOUS | Status: AC
  Administered 2018-03-01: 3.375 g via INTRAVENOUS
  Filled 2018-03-01: qty 50

## 2018-03-01 MED ORDER — CALCIUM CARBONATE-VITAMIN D 500-200 MG-UNIT PO TABS
1.0000 | ORAL_TABLET | Freq: Every day | ORAL | Status: DC
  Administered 2018-03-01 – 2018-03-02 (×2): 1 via ORAL
  Filled 2018-03-01 (×2): qty 1

## 2018-03-01 MED ORDER — FENTANYL CITRATE (PF) 100 MCG/2ML IJ SOLN
50.0000 ug | Freq: Once | INTRAMUSCULAR | Status: AC
  Administered 2018-03-01: 50 ug via INTRAVENOUS
  Filled 2018-03-01: qty 2

## 2018-03-01 MED ORDER — MAGNESIUM SULFATE 2 GM/50ML IV SOLN
2.0000 g | Freq: Once | INTRAVENOUS | Status: AC
  Administered 2018-03-01: 2 g via INTRAVENOUS
  Filled 2018-03-01: qty 50

## 2018-03-01 MED ORDER — PROMETHAZINE HCL 25 MG PO TABS: 25.0000 mg | ORAL_TABLET | Freq: Four times a day (QID) | ORAL | Status: DC | PRN

## 2018-03-01 MED ORDER — STERILE WATER FOR INJECTION IV SOLN
INTRAVENOUS | Status: DC
  Administered 2018-03-01: 18:00:00 via INTRAVENOUS
  Filled 2018-03-01: qty 850

## 2018-03-01 MED ORDER — VANCOMYCIN HCL IN DEXTROSE 1-5 GM/200ML-% IV SOLN
1000.0000 mg | Freq: Once | INTRAVENOUS | Status: AC
  Administered 2018-03-01: 1000 mg via INTRAVENOUS
  Filled 2018-03-01: qty 200

## 2018-03-01 MED ORDER — INSULIN GLARGINE 100 UNIT/ML ~~LOC~~ SOLN
20.0000 [IU] | Freq: Every day | SUBCUTANEOUS | Status: DC
  Administered 2018-03-01 – 2018-03-02 (×2): 20 [IU] via SUBCUTANEOUS
  Filled 2018-03-01 (×2): qty 0.2

## 2018-03-01 MED ORDER — HYDROMORPHONE HCL 1 MG/ML IJ SOLN: 0.5000 mg | Freq: Once | INTRAMUSCULAR | Status: DC

## 2018-03-01 MED ORDER — DICYCLOMINE HCL 20 MG PO TABS
20.0000 mg | ORAL_TABLET | Freq: Two times a day (BID) | ORAL | Status: DC
  Administered 2018-03-01 – 2018-03-06 (×10): 20 mg via ORAL
  Filled 2018-03-01 (×10): qty 1

## 2018-03-01 MED ORDER — HEPARIN SODIUM (PORCINE) 5000 UNIT/ML IJ SOLN
5000.0000 [IU] | Freq: Three times a day (TID) | INTRAMUSCULAR | Status: DC
  Administered 2018-03-01 – 2018-03-06 (×15): 5000 [IU] via SUBCUTANEOUS
  Filled 2018-03-01 (×14): qty 1

## 2018-03-01 MED ORDER — SODIUM CHLORIDE 0.9 % IV BOLUS: 1000.0000 mL | Freq: Once | INTRAVENOUS | Status: DC

## 2018-03-01 MED ORDER — HYDROCORTISONE NA SUCCINATE PF 100 MG IJ SOLR
100.0000 mg | Freq: Once | INTRAMUSCULAR | Status: AC
  Administered 2018-03-01: 100 mg via INTRAVENOUS
  Filled 2018-03-01: qty 2

## 2018-03-01 MED ORDER — METOCLOPRAMIDE HCL 10 MG PO TABS
10.0000 mg | ORAL_TABLET | Freq: Three times a day (TID) | ORAL | Status: DC
  Administered 2018-03-01 – 2018-03-02 (×3): 10 mg via ORAL
  Filled 2018-03-01 (×3): qty 1

## 2018-03-01 MED ORDER — FAMOTIDINE 20 MG PO TABS
10.0000 mg | ORAL_TABLET | Freq: Every day | ORAL | Status: DC
  Administered 2018-03-01 – 2018-03-06 (×6): 10 mg via ORAL
  Filled 2018-03-01 (×6): qty 1

## 2018-03-01 MED ORDER — GABAPENTIN 300 MG PO CAPS
300.0000 mg | ORAL_CAPSULE | Freq: Three times a day (TID) | ORAL | Status: DC
  Administered 2018-03-01 – 2018-03-02 (×3): 300 mg via ORAL
  Filled 2018-03-01 (×3): qty 1

## 2018-03-01 MED ORDER — SODIUM CHLORIDE 0.9% FLUSH
3.0000 mL | Freq: Two times a day (BID) | INTRAVENOUS | Status: DC
  Administered 2018-03-01 – 2018-03-05 (×8): 3 mL via INTRAVENOUS

## 2018-03-01 MED ORDER — SIMVASTATIN 20 MG PO TABS
20.0000 mg | ORAL_TABLET | Freq: Every day | ORAL | Status: DC
  Administered 2018-03-01 – 2018-03-05 (×5): 20 mg via ORAL
  Filled 2018-03-01: qty 1
  Filled 2018-03-01: qty 2
  Filled 2018-03-01 (×4): qty 1
  Filled 2018-03-01: qty 2

## 2018-03-01 MED ORDER — ACETAMINOPHEN 650 MG RE SUPP: 650.0000 mg | Freq: Four times a day (QID) | RECTAL | Status: DC | PRN

## 2018-03-01 MED ORDER — MAGNESIUM SULFATE 50 % IJ SOLN
3.0000 g | Freq: Once | INTRAVENOUS | Status: DC
  Filled 2018-03-01: qty 6

## 2018-03-01 MED ORDER — INSULIN DEGLUDEC 100 UNIT/ML ~~LOC~~ SOPN
20.0000 [IU] | PEN_INJECTOR | Freq: Every day | SUBCUTANEOUS | Status: DC
  Filled 2018-03-01: qty 3

## 2018-03-01 MED ORDER — POTASSIUM CHLORIDE CRYS ER 20 MEQ PO TBCR
40.0000 meq | EXTENDED_RELEASE_TABLET | Freq: Once | ORAL | Status: AC
  Administered 2018-03-01: 40 meq via ORAL
  Filled 2018-03-01: qty 2

## 2018-03-01 MED ORDER — SODIUM CHLORIDE 0.9 % IV SOLN: 1.0000 g | Freq: Once | INTRAVENOUS | Status: DC

## 2018-03-01 MED ORDER — PREDNISONE 5 MG PO TABS
10.0000 mg | ORAL_TABLET | ORAL | Status: DC
  Administered 2018-03-02 – 2018-03-05 (×4): 10 mg via ORAL
  Filled 2018-03-01 (×2): qty 2
  Filled 2018-03-01 (×2): qty 1

## 2018-03-01 MED ORDER — CALCIUM GLUCONATE-NACL 1-0.675 GM/50ML-% IV SOLN
1.0000 g | Freq: Once | INTRAVENOUS | Status: AC
  Administered 2018-03-01: 1000 mg via INTRAVENOUS
  Filled 2018-03-01: qty 50

## 2018-03-01 MED ORDER — SODIUM CHLORIDE 0.9 % IV BOLUS
1000.0000 mL | Freq: Once | INTRAVENOUS | Status: AC
  Administered 2018-03-01: 1000 mL via INTRAVENOUS

## 2018-03-01 MED ORDER — TACROLIMUS 1 MG PO CAPS
3.0000 mg | ORAL_CAPSULE | Freq: Two times a day (BID) | ORAL | Status: DC
  Administered 2018-03-01 – 2018-03-06 (×10): 3 mg via ORAL
  Filled 2018-03-01 (×10): qty 3

## 2018-03-01 MED ORDER — MYCOPHENOLATE SODIUM 180 MG PO TBEC
720.0000 mg | DELAYED_RELEASE_TABLET | Freq: Two times a day (BID) | ORAL | Status: DC
  Administered 2018-03-01 – 2018-03-06 (×10): 720 mg via ORAL
  Filled 2018-03-01 (×10): qty 4

## 2018-03-01 MED ORDER — INSULIN ASPART 100 UNIT/ML ~~LOC~~ SOLN
0.0000 [IU] | Freq: Three times a day (TID) | SUBCUTANEOUS | Status: DC
  Administered 2018-03-01 – 2018-03-02 (×2): 3 [IU] via SUBCUTANEOUS
  Administered 2018-03-02: 5 [IU] via SUBCUTANEOUS
  Administered 2018-03-03 (×2): 3 [IU] via SUBCUTANEOUS
  Administered 2018-03-04 (×2): 15 [IU] via SUBCUTANEOUS
  Administered 2018-03-04 – 2018-03-05 (×2): 11 [IU] via SUBCUTANEOUS

## 2018-03-01 MED ORDER — CALCITRIOL 0.25 MCG PO CAPS
0.2500 ug | ORAL_CAPSULE | Freq: Every day | ORAL | Status: DC
  Administered 2018-03-01 – 2018-03-04 (×4): 0.25 ug via ORAL
  Filled 2018-03-01 (×4): qty 1

## 2018-03-01 MED ORDER — PIPERACILLIN-TAZOBACTAM IN DEX 2-0.25 GM/50ML IV SOLN
2.2500 g | Freq: Three times a day (TID) | INTRAVENOUS | Status: DC
  Administered 2018-03-01 – 2018-03-02 (×3): 2.25 g via INTRAVENOUS
  Filled 2018-03-01 (×4): qty 50

## 2018-03-01 MED ORDER — ONDANSETRON HCL 4 MG/2ML IJ SOLN
4.0000 mg | Freq: Once | INTRAMUSCULAR | Status: AC
  Administered 2018-03-01: 4 mg via INTRAVENOUS
  Filled 2018-03-01: qty 2

## 2018-03-01 MED ORDER — SODIUM CHLORIDE 0.9 % IV SOLN
1.0000 g | Freq: Once | INTRAVENOUS | Status: AC
  Administered 2018-03-01: 1 g via INTRAVENOUS
  Filled 2018-03-01: qty 10

## 2018-03-01 MED ORDER — ACETAMINOPHEN 325 MG PO TABS
650.0000 mg | ORAL_TABLET | Freq: Four times a day (QID) | ORAL | Status: DC | PRN
  Administered 2018-03-03 – 2018-03-04 (×2): 650 mg via ORAL
  Filled 2018-03-01 (×2): qty 2

## 2018-03-01 MED ORDER — CALCIUM ACETATE (PHOS BINDER) 667 MG PO CAPS
1334.0000 mg | ORAL_CAPSULE | Freq: Three times a day (TID) | ORAL | Status: DC
  Administered 2018-03-01 – 2018-03-06 (×13): 1334 mg via ORAL
  Filled 2018-03-01 (×16): qty 2

## 2018-03-01 MED ORDER — FLUTICASONE PROPIONATE 50 MCG/ACT NA SUSP
2.0000 | Freq: Every day | NASAL | Status: DC
  Administered 2018-03-02 – 2018-03-05 (×4): 2 via NASAL
  Filled 2018-03-01: qty 16

## 2018-03-01 MED ORDER — STERILE WATER FOR INJECTION IV SOLN
INTRAVENOUS | Status: DC
  Administered 2018-03-01: 13:00:00 via INTRAVENOUS
  Filled 2018-03-01: qty 850

## 2018-03-01 MED ORDER — INSULIN ASPART 100 UNIT/ML ~~LOC~~ SOLN
0.0000 [IU] | Freq: Every day | SUBCUTANEOUS | Status: DC
  Administered 2018-03-01 – 2018-03-02 (×2): 2 [IU] via SUBCUTANEOUS
  Administered 2018-03-03 – 2018-03-04 (×2): 5 [IU] via SUBCUTANEOUS

## 2018-03-01 MED ORDER — HYDROXYZINE HCL 25 MG PO TABS: 50.0000 mg | ORAL_TABLET | Freq: Two times a day (BID) | ORAL | Status: DC | PRN

## 2018-03-01 MED ORDER — ONDANSETRON HCL 4 MG PO TABS: 4.0000 mg | ORAL_TABLET | Freq: Four times a day (QID) | ORAL | Status: DC | PRN

## 2018-03-01 MED ORDER — SODIUM BICARBONATE 650 MG PO TABS: 650.0000 mg | ORAL_TABLET | Freq: Every day | ORAL | Status: DC

## 2018-03-01 MED ORDER — ACETAMINOPHEN 650 MG RE SUPP
650.0000 mg | Freq: Once | RECTAL | Status: AC
  Administered 2018-03-01: 650 mg via RECTAL
  Filled 2018-03-01: qty 1

## 2018-03-01 MED ORDER — SODIUM BICARBONATE 650 MG PO TABS: 650.0000 mg | ORAL_TABLET | Freq: Three times a day (TID) | ORAL | Status: DC

## 2018-03-01 MED ORDER — ALBUTEROL SULFATE (2.5 MG/3ML) 0.083% IN NEBU: 2.5000 mg | INHALATION_SOLUTION | RESPIRATORY_TRACT | Status: DC | PRN

## 2018-03-01 NOTE — ED Notes (Signed)
Vascular tech at bedside. °

## 2018-03-01 NOTE — ED Notes (Signed)
Attempted [place] line x1. Unable to obtain access, however was able to get labs. Pt to CT, will attempt again after return.

## 2018-03-01 NOTE — Consult Note (Signed)
Renal Service Consult Note [facility]  [name] 03/01/2018 [name] Requesting Physician:  Dr Alfredia Ferguson  Reason for Consult: Transplant pt w/ cellulitis HPI: The patient is a 32 y.o. year-old with hx of HTN, HL, DM2 and ESRD w FSGS sp renal Tx in 2007 who's transplant has been declining and she is suspected to be heading back for dialysis..  She is admitted for L leg pain and erythema and started on IV abx for suspected cellulitis.    Transplant in 2007 at [facility]. 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 Biopsy at that time showed moderate to severe interstitial fibrosis.   Had worsening renal function this year and had renal biopsy Aug 2019 at [facility] in [place] which showed multiple issues includling chronic Tx rejection/ diabetic changes/ mild IS rejection/ +IS atrophy and transplant glomerulopathy.  Creat there was 2.8, they increased her prednisone to 10 bid and increased her prograf to 3 bid. I spoke w/ her transplant team and they felt her transplant prognosis was not good.    Patient has no specific c/o other than pain and swelling in LLE.  No sob, cough, no CP, no n/v/d.     ROS  denies CP  no joint pain   no HA  no blurry vision  no rash  no diarrhea  no nausea/ vomiting  no dysuria  no difficulty voiding  no change in urine color    Past Medical History  Past Medical History:  Diagnosis Date  . Chronic kidney disease   . Diabetes mellitus    Pt reports diagnosis in June 2011  . Hyperlipidemia   . Hypertension   . Kidney transplant recipient   . 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 Pyelonephritis 06/23/2014  . UTI (urinary tract infection) 01/09/2015  . 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   Qualifier: Diagnosis of  By: Carlena Sax  MD, Colletta Maryland     Past Surgical History  Past Surgical History:   Procedure Laterality Date  . ARTERIOVENOUS GRAFT PLACEMENT Bilateral    "neither work" (10/24/2017)  . CHOLECYSTECTOMY N/A 06/30/2017   Procedure: LAPAROSCOPIC CHOLECYSTECTOMY WITH INTRAOPERATIVE CHOLANGIOGRAM;  Surgeon: Excell Seltzer, MD;  Location: [facility];  Service: General;  Laterality: N/A;  . ESOPHAGOGASTRODUODENOSCOPY (EGD) WITH PROPOFOL N/A 07/04/2017   Procedure: ESOPHAGOGASTRODUODENOSCOPY (EGD) WITH PROPOFOL;  Surgeon: Clarene Essex, MD;  Location: [facility];  Service: Endoscopy;  Laterality: N/A;  . KIDNEY TRANSPLANT  2007  . PARATHYROIDECTOMY  ?2012   "3/4 removed" (10/24/2017)  . RENAL BIOPSY Bilateral 2003   Family History  Family History  Problem Relation Age of Onset  . Arthritis Mother   . Diabetes Mother   . Hypertension Mother    Social History  reports that she has never smoked. She has never used smokeless tobacco. She reports that she does not drink alcohol or use drugs. Allergies  Allergies  Allergen Reactions  . Benadryl [Diphenhydramine-Zinc Acetate] Shortness Of Breath  . Motrin [Ibuprofen] Shortness Of Breath and Itching    Per pt  . Banana Other (See Comments)    Sick on the stomach  . Diphenhydramine Hcl     REACTION: Stopped breathing in ICU  . Doxycycline     Shortness of Breath   . Iron Dextran     REACTION: vein irritation  . Shellfish Allergy Hives   Home medications  Prior to Admission medications   Medication Sig Start Date End Date Taking? Authorizing Provider  acetaminophen (TYLENOL) 500 MG tablet Take 1,000 mg by mouth daily as needed for moderate pain.    Yes [provider]  calcitRIOL (ROCALTROL) 0.25 MCG capsule Take 0.25 mcg by mouth daily.   Yes [provider]  calcium acetate (PHOSLO) 667 MG capsule Take 1,334 mg by mouth 3 (three) times daily with meals.   Yes [provider]  Calcium Carb-Cholecalciferol (CALCIUM 1000 + D PO) Take 1 tablet by mouth daily.    Yes [provider]  calcium  elemental as carbonate (BARIATRIC TUMS ULTRA) 400 MG chewable tablet Chew 1,000 mg by mouth 3 (three) times daily as needed for heartburn.    Yes [provider]  carvedilol (COREG) 12.5 MG tablet Take 12.5 mg by mouth 2 (two) times daily with a meal.   Yes [provider]  dicyclomine (BENTYL) 20 MG tablet Take 1 tablet (20 mg total) by mouth 2 (two) times daily. 02/06/18  Yes [name], Ripudeep K, MD  famotidine (PEPCID) 10 MG tablet Take 10 mg by mouth daily.   Yes [provider]  fluticasone (FLONASE) 50 MCG/ACT nasal spray Place 2 sprays into both nostrils daily. Patient taking differently: Place 2 sprays into both nostrils daily as needed for allergies.  07/20/15  Yes Muthersbaugh, Jarrett Soho, PA-C  gabapentin (NEURONTIN) 300 MG capsule Take 300 mg by mouth 3 (three) times daily.   Yes [provider]  hydrOXYzine (ATARAX/VISTARIL) 50 MG tablet Take 1 tablet (50 mg total) by mouth 2 (two) times daily as needed for itching. 02/06/18  Yes [name], Ripudeep K, MD  insulin aspart (NOVOLOG FLEXPEN) 100 UNIT/ML FlexPen Inject 12-15 Units into the skin See admin instructions. 5-6 times per day as needed   Yes [provider]  insulin degludec (TRESIBA) 100 UNIT/ML SOPN FlexTouch Pen Inject 0.2 mLs (20 Units total) into the skin at bedtime. Patient taking differently: Inject 60 Units into the skin daily. Reports taking 24 units QAM 10/26/17  Yes Emokpae, Courage, MD  loperamide (IMODIUM) 2 MG capsule Take 2 mg by mouth as needed for diarrhea or loose stools.    Yes [provider]  metoCLOPramide (REGLAN) 10 MG tablet Take 1 tablet (10 mg total) by mouth 3 (three) times daily before meals. 02/06/18  Yes [name], Ripudeep K, MD  mycophenolate (MYFORTIC) 360 MG TBEC Take 720 mg by mouth 2 (two) times daily.    Yes [provider]  predniSONE (DELTASONE) 10 MG tablet Take 1 tablet (10 mg total) by mouth every morning. 02/06/18  Yes [name], Ripudeep K, MD   simvastatin (ZOCOR) 20 MG tablet Take 20 mg by mouth at bedtime.    Yes [provider]  sodium bicarbonate 650 MG tablet Take 650 mg by mouth 3 (three) times daily.   Yes [provider]  tacrolimus (PROGRAF) 1 MG capsule Take 3 mg by mouth 2 (two) times daily.    Yes [provider]  torsemide (DEMADEX) 20 MG tablet Take 4 tablets (80 mg total) by mouth daily. 02/07/18  Yes [name], Ripudeep K, MD  ACCU-CHEK SOFTCLIX LANCETS lancets Use to check blood sugar 4 times per day. 12/29/15   Renato Shin, MD  ondansetron (ZOFRAN ODT) 4 MG disintegrating tablet Take 1 tablet (4 mg total) by mouth every 4 (four) hours as needed for nausea or vomiting. 10/26/17   Roxan Hockey, MD  promethazine (PHENERGAN) 25 MG tablet Take 1  tablet (25 mg total) by mouth every 6 (six) hours as needed for nausea or vomiting. 02/06/18   Mendel Corning, MD   Liver Function Tests Recent Labs  Lab 03/01/18 1100 03/01/18 1717  AST 17 15  ALT 25 21  ALKPHOS 39 32*  BILITOT 0.7 0.4  PROT 6.2* 5.4*  ALBUMIN 3.0* 2.4*   No results for input(s): LIPASE, AMYLASE in the last 168 hours. CBC Recent Labs  Lab 03/01/18 1100 03/01/18 1717  WBC 15.1* 14.4*  NEUTROABS 11.5* 12.3*  HGB 9.4* 8.2*  HCT 30.4* 27.2*  MCV 94.4 94.8  PLT 162 673*   Basic Metabolic Panel Recent Labs  Lab 03/01/18 1100 03/01/18 1717  NA 138 139  K 3.0* 3.6  CL 113* 115*  CO2 13* 12*  GLUCOSE 230* 172*  BUN 108* 104*  CREATININE 5.66* 5.28*  CALCIUM 4.6* 4.3*  PHOS  --  5.2*   Iron/TIBC/Ferritin/ %Sat    Component Value Date/Time   IRON 79 12/08/2017 1156   TIBC 209 (L) 12/08/2017 1156   FERRITIN 1,383 (H) 12/08/2017 1156   IRONPCTSAT 38 (H) 12/08/2017 1156    Vitals:   03/01/18 1657 03/01/18 1800 03/01/18 1900 03/01/18 2037  BP:   138/83   Pulse:  99 (!) 105   Resp:  15 (!) 27   Temp: 99 F (37.2 C)   98.6 F (37 C)  TempSrc: Oral   Axillary  SpO2:  99% 98%   Height:       Exam Gen  alert, facial puffiness, no distress , calm No rash, cyanosis or gangrene Sclera anicteric, throat clear No jvd or bruits Chest clear bilat to bases, no rales RRR no MRG  Abd soft ntnd no mass or ascites +bs GU defer MS no joint effusions or deformity Ext diffuse mild-mod edema nonpitting of the legs and arms, no wounds or ulcers. 2+ R leg edema, 3+ L leg edema w/ patchy erythema.  Neuro is alert, Ox 3 , nf, pleasant and appropriate    Home meds:  - tacrolimus 3 bid/ prednisone 10 qd/ mycophenolate 720 bid - torsemide 80 qd/ carvedilol 12.5 bid - insulin degludec 24u qam/ insulin aspart 12-15 ssi ac - prn promethazine/ metoclopramide 10 tid/ dicyclomine 20 bid/ famotidine 10 qd - simvastatin 20 hs - calcium acetate 2ac tid/ calcitriol 0.25 qd/ sodium bicarbonate tid  - gabapentin 300 tid    Impression/ Plan: 1. LLE cellulitis - per primary team 2. 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  The transplant has been failing w/ recent Bx in August. She is not far from requiring dialysis and has been discussing this w/ her renal MD. Not uremic at this time, no indication for dialysis. Getting her proper Tx meds.  Will folllow.  3. Volume - looks overloaded already, have decreased her IVF"s to 40/hr and may need diuretics.   4. DM2 on insulin 5. HTN - not a big issue on low dose coreg, bp's good 6. Hypocalcemia - Corr Ca 5.4 which is low, not symptomatic. Recommend high-dose IV Ca gluc 4-6gm over 12 hrs and repeat in am. Will consult pharm.  7. Anemia ckd - no bleeding noted     Kelly Splinter MD [facility] pager [phone]   03/01/2018, 8:40 PM

## 2018-03-01 NOTE — ED Notes (Signed)
Made Dr Ellender Hose aware that dont have IV access on patient and if he can come attempt. Ultrasound set up in room.

## 2018-03-01 NOTE — ED Provider Notes (Signed)
[facility] DEPT Provider Note   CSN: [id number] Arrival date & time: 03/01/18  2229     History   Chief Complaint Chief Complaint  Patient presents with  . Leg Pain  . Leg Swelling    HPI [name] is a 32 y.o. female.  HPI 32 year old female with history of previous ESRD, status post renal transplant, diabetes, here with severe left leg pain.  The patient states that over the last 2 days, she has had progressively worsening, rapidly progressing pain in her left thigh.  She noticed redness, warmth, and pain in the area but denies any preceding trauma.  Since then, the pain and redness that spread throughout her anterior thigh.  It is worse with any kind of movement or palpation.  She been essentially unable to walk due to the pain.  She denies any alleviating factors.  No chills but has had fevers.  She endorses generalized weakness, as well as mild drowsiness.  Denies any headaches.  No abdominal pain.  No drainage from the wound.  No open wounds.  Past Medical History:  Diagnosis Date  . Chronic kidney disease   . Diabetes mellitus    Pt reports diagnosis in June 2011  . Hyperlipidemia   . Hypertension   . Kidney transplant recipient   . 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 Pyelonephritis 06/23/2014  . UTI (urinary tract infection) 01/09/2015  . 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 Metabolic acidosis 79/89/2119  . Hypokalemia 02/03/2018  . Acute renal failure superimposed on stage 4 chronic kidney disease ([employer]) 02/03/2018  . Incontinence of bowel 02/03/2018  . Chronic pain 11/11/2017  . Gastroparesis   . AKI (acute kidney injury) ([place]) 07/13/2017  . Constipation 07/13/2017  . Hematemesis 07/02/2017  . Chronic cholecystitis 06/29/2017  . Type 1 diabetes mellitus  with complication, uncontrolled ([place]) 05/25/2015  . Nausea & vomiting 01/09/2015  . Acute renal failure superimposed on stage 3 chronic kidney disease ([place]) 01/09/2015  . Renal transplant recipient   . Immunosuppressed status ([place])   . 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 Chronic kidney disease 01/04/2010  . OVARIAN FAILURE, PREMATURE 03/11/2009  . 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 SECONDARY HYPERPARATHYROIDISM 12/05/2007  . OBESITY 09/25/2007  . Anemia due to chronic kidney disease 09/25/2007  . LEARNING DISABILITY 09/25/2007  . Essential hypertension, benign 09/25/2007  . KIDNEY TRANSPLANTATION, HX OF 09/25/2007    Past Surgical History:  Procedure Laterality Date  . ARTERIOVENOUS GRAFT PLACEMENT Bilateral    "neither work" (10/24/2017)  . CHOLECYSTECTOMY N/A 06/30/2017   Procedure: LAPAROSCOPIC CHOLECYSTECTOMY WITH INTRAOPERATIVE CHOLANGIOGRAM;  Surgeon: Excell Seltzer, MD;  Location: [facility] ORS;  Service: General;  Laterality: N/A;  . ESOPHAGOGASTRODUODENOSCOPY (EGD) WITH PROPOFOL N/A 07/04/2017   Procedure: ESOPHAGOGASTRODUODENOSCOPY (EGD) WITH PROPOFOL;  Surgeon: Clarene Essex, MD;  Location: [facility] ENDOSCOPY;  Service: Endoscopy;  Laterality: N/A;  . KIDNEY TRANSPLANT  2007  . PARATHYROIDECTOMY  ?2012   "3/4 removed" (10/24/2017)  . RENAL BIOPSY Bilateral 2003     OB History   No obstetric history on file.      Home Medications    Prior to Admission medications   Medication Sig Start Date End Date Taking? Authorizing Provider  acetaminophen (TYLENOL) 500 MG tablet Take  1,000 mg by mouth daily as needed for moderate pain.    Yes [provider]  calcitRIOL (ROCALTROL) 0.25 MCG capsule Take 0.25 mcg by mouth daily.   Yes [provider]  calcium acetate (PHOSLO) 667 MG capsule Take 1,334 mg by mouth 3 (three) times daily with meals.   Yes [provider]  Calcium Carb-Cholecalciferol (CALCIUM 1000 + D PO) Take 1 tablet by  mouth daily.    Yes [provider]  calcium elemental as carbonate (BARIATRIC TUMS ULTRA) 400 MG chewable tablet Chew 1,000 mg by mouth 3 (three) times daily as needed for heartburn.    Yes [provider]  carvedilol (COREG) 12.5 MG tablet Take 12.5 mg by mouth 2 (two) times daily with a meal.   Yes [provider]  dicyclomine (BENTYL) 20 MG tablet Take 1 tablet (20 mg total) by mouth 2 (two) times daily. 02/06/18  Yes [name], Ripudeep K, MD  famotidine (PEPCID) 10 MG tablet Take 10 mg by mouth daily.   Yes [provider]  fluticasone (FLONASE) 50 MCG/ACT nasal spray Place 2 sprays into both nostrils daily. Patient taking differently: Place 2 sprays into both nostrils daily as needed for allergies.  07/20/15  Yes Muthersbaugh, Jarrett Soho, PA-C  gabapentin (NEURONTIN) 300 MG capsule Take 300 mg by mouth 3 (three) times daily.   Yes [provider]  hydrOXYzine (ATARAX/VISTARIL) 50 MG tablet Take 1 tablet (50 mg total) by mouth 2 (two) times daily as needed for itching. 02/06/18  Yes [name], Ripudeep K, MD  insulin aspart (NOVOLOG FLEXPEN) 100 UNIT/ML FlexPen Inject 12-15 Units into the skin See admin instructions. 5-6 times per day as needed   Yes [provider]  insulin degludec (TRESIBA) 100 UNIT/ML SOPN FlexTouch Pen Inject 0.2 mLs (20 Units total) into the skin at bedtime. Patient taking differently: Inject 60 Units into the skin daily. Reports taking 24 units QAM 10/26/17  Yes Emokpae, Courage, MD  loperamide (IMODIUM) 2 MG capsule Take 2 mg by mouth as needed for diarrhea or loose stools.    Yes [provider]  metoCLOPramide (REGLAN) 10 MG tablet Take 1 tablet (10 mg total) by mouth 3 (three) times daily before meals. 02/06/18  Yes [name], Ripudeep K, MD  mycophenolate (MYFORTIC) 360 MG TBEC Take 720 mg by mouth 2 (two) times daily.    Yes [provider]  predniSONE (DELTASONE) 10 MG tablet Take 1 tablet (10 mg total) by mouth every  morning. 02/06/18  Yes [name], Ripudeep K, MD  simvastatin (ZOCOR) 20 MG tablet Take 20 mg by mouth at bedtime.    Yes [provider]  sodium bicarbonate 650 MG tablet Take 650 mg by mouth 3 (three) times daily.   Yes [provider]  tacrolimus (PROGRAF) 1 MG capsule Take 3 mg by mouth 2 (two) times daily.    Yes [provider]  torsemide (DEMADEX) 20 MG tablet Take 4 tablets (80 mg total) by mouth daily. 02/07/18  Yes [name], Ripudeep K, MD  ACCU-CHEK SOFTCLIX LANCETS lancets Use to check blood sugar 4 times per day. 12/29/15   Renato Shin, MD  ondansetron (ZOFRAN ODT) 4 MG disintegrating tablet Take 1 tablet (4 mg total) by mouth every 4 (four) hours as needed for nausea or vomiting. 10/26/17   Roxan Hockey, MD  promethazine (PHENERGAN) 25 MG tablet Take 1 tablet (25 mg total) by mouth every 6 (six) hours as needed for nausea or vomiting. 02/06/18   [name], Ripudeep K,  MD    Family History Family History  Problem Relation Age of Onset  . Arthritis Mother   . Diabetes Mother   . Hypertension Mother     Social History Social History   Tobacco Use  . Smoking status: Never Smoker  . Smokeless tobacco: Never Used  Substance Use Topics  . Alcohol use: No  . Drug use: No     Allergies   Benadryl [diphenhydramine-zinc acetate]; Motrin [ibuprofen]; Banana; Diphenhydramine hcl; Doxycycline; Iron dextran; and Shellfish allergy   Review of Systems Review of Systems  Constitutional: Positive for fatigue and fever. Negative for chills.  HENT: Negative for congestion, rhinorrhea and sore throat.   Eyes: Negative for visual disturbance.  Respiratory: Negative for cough, shortness of breath and wheezing.   Cardiovascular: Negative for chest pain and leg swelling.  Gastrointestinal: Negative for abdominal pain, diarrhea, nausea and vomiting.  Genitourinary: Negative for dysuria, flank pain, vaginal bleeding and vaginal discharge.  Musculoskeletal: Positive for  arthralgias and gait problem. Negative for neck pain.  Skin: Positive for rash.  Allergic/Immunologic: Negative for immunocompromised state.  Neurological: Positive for weakness. Negative for syncope and headaches.  Hematological: Does not bruise/bleed easily.  All other systems reviewed and are negative.    Physical Exam Updated Vital Signs BP 117/82   Pulse (!) 113   Temp (!) 100.4 F (38 C) (Oral)   Resp (!) 24   Ht 5\' 6"  (1.676 m)   SpO2 100%   BMI 30.51 kg/m   Physical Exam Vitals signs and nursing note reviewed.  Constitutional:      General: She is not in acute distress.    Appearance: She is well-developed.  HENT:     Head: Normocephalic and atraumatic.     Mouth/Throat:     Mouth: Mucous membranes are dry.  Eyes:     Conjunctiva/sclera: Conjunctivae normal.  Neck:     Musculoskeletal: Neck supple.  Cardiovascular:     Rate and Rhythm: Regular rhythm. Tachycardia present.     Heart sounds: Normal heart sounds. No murmur. No friction rub.  Pulmonary:     Effort: Pulmonary effort is normal. No respiratory distress.     Breath sounds: Normal breath sounds. No wheezing or rales.  Abdominal:     General: Abdomen is flat. There is no distension.  Musculoskeletal:     Right lower leg: Edema present.     Left lower leg: Edema present.  Skin:    General: Skin is warm.     Capillary Refill: Capillary refill takes less than 2 seconds.     Comments: Marketed erythema and tenderness to palpation over the left anterior thigh, extending from the hip to the proximal lower leg, worse along the lateral aspect of the thigh, with exquisite tenderness.  No palpable crepitance.  Tenderness does seem to extend beyond the borders of erythema.  Neurological:     Mental Status: She is alert and oriented to person, place, and time.     Motor: No abnormal muscle tone.      ED Treatments / Results  Labs (all labs ordered are listed, but only abnormal results are displayed) Labs  Reviewed  COMPREHENSIVE METABOLIC PANEL - Abnormal; Notable for the following components:      Result Value   Potassium 3.0 (*)    Chloride 113 (*)    CO2 13 (*)    Glucose, Bld 230 (*)    BUN 108 (*)    Creatinine, Ser 5.66 (*)  Calcium 4.6 (*)    Total Protein 6.2 (*)    Albumin 3.0 (*)    GFR calc non Af Amer 9 (*)    GFR calc Af Amer 10 (*)    All other components within normal limits  CBC WITH DIFFERENTIAL/PLATELET - Abnormal; Notable for the following components:   WBC 15.1 (*)    RBC 3.22 (*)    Hemoglobin 9.4 (*)    HCT 30.4 (*)    Neutro Abs 11.5 (*)    Abs Immature Granulocytes 0.10 (*)    All other components within normal limits  URINALYSIS, ROUTINE W REFLEX MICROSCOPIC - Abnormal; Notable for the following components:   Color, Urine STRAW (*)    Glucose, UA >=500 (*)    Hgb urine dipstick SMALL (*)    Protein, ur 100 (*)    Leukocytes, UA TRACE (*)    Bacteria, UA RARE (*)    All other components within normal limits  CULTURE, BLOOD (ROUTINE X 2)  CULTURE, BLOOD (ROUTINE X 2)  URINE CULTURE  BRAIN NATRIURETIC PEPTIDE  I-STAT CG4 LACTIC ACID, ED  POC URINE PREG, ED  I-STAT CG4 LACTIC ACID, ED    EKG EKG Interpretation  Date/Time:  Wednesday March 01 2018 10:06:46 EST Ventricular Rate:  123 PR Interval:    QRS Duration: 81 QT Interval:  313 QTC Calculation: 448 R Axis:   71 Text Interpretation:  Sinus tachycardia Nonspecific T abnormalities, diffuse leads Nonspecific TW changes new in comparison to prior Confirmed by Gareth Morgan [phone]) on 03/01/2018 10:09:48 AM   Radiology Ct Femur Left Wo Contrast  Result Date: 03/01/2018 CLINICAL DATA:  Bilateral leg pain for 2 days. EXAM: CT OF THE LOWER LEFT EXTREMITY WITHOUT CONTRAST TECHNIQUE: Multidetector CT imaging of the lower left extremity was performed according to the standard protocol. COMPARISON:  None. FINDINGS: Bones/Joint/Cartilage No fracture or dislocation. Normal alignment. No joint  effusion. No periosteal reaction or bone destruction. No aggressive osseous lesion. Ligaments Ligaments are suboptimally evaluated by CT. Muscles and Tendons No muscle atrophy.  No intramuscular fluid collection or hematoma. Soft tissue No fluid collection or hematoma. No soft tissue mass. Soft tissue edema in the subcutaneous fat of left thigh with mild skin thickening. IMPRESSION: 1. Nonspecific soft tissue edema in the subcutaneous fat of the left thigh which may reflect cellulitis versus venous insufficiency. Electronically Signed   By: Kathreen Devoid   On: 03/01/2018 11:28   Dg Chest Portable 1 View  Result Date: 03/01/2018 CLINICAL DATA:  Shortness of breath and chest pain EXAM: PORTABLE CHEST 1 VIEW COMPARISON:  02/02/2018 FINDINGS: The heart size and mediastinal contours are within normal limits. Both lungs are clear. The visualized skeletal structures are unremarkable. IMPRESSION: No active disease. Electronically Signed   By: Kathreen Devoid   On: 03/01/2018 10:48   Vas [place] Lower Extremity Venous (dvt) (only [facility])  Result Date: 03/01/2018  Lower Venous Study Indications: Swelling, and Pain.  Performing Technologist: Abram Sander RVS  Examination Guidelines: A complete evaluation includes B-mode imaging, spectral Doppler, color Doppler, and power Doppler as needed of all accessible portions of each vessel. Bilateral testing is considered an integral part of a complete examination. Limited examinations for reoccurring indications may be performed as noted.  Right Venous Findings: +---------+---------------+---------+-----------+----------+--------------+          CompressibilityPhasicitySpontaneityPropertiesSummary        +---------+---------------+---------+-----------+----------+--------------+ CFV      Full  Yes      Yes                                 +---------+---------------+---------+-----------+----------+--------------+ SFJ      Full                                                         +---------+---------------+---------+-----------+----------+--------------+ FV Prox  Full                                                        +---------+---------------+---------+-----------+----------+--------------+ FV Mid   Full                                                        +---------+---------------+---------+-----------+----------+--------------+ FV DistalFull                                                        +---------+---------------+---------+-----------+----------+--------------+ PFV      Full                                                        +---------+---------------+---------+-----------+----------+--------------+ POP      Full           Yes      Yes                                 +---------+---------------+---------+-----------+----------+--------------+ PTV      Full                                                        +---------+---------------+---------+-----------+----------+--------------+ PERO                                                  Not visualized +---------+---------------+---------+-----------+----------+--------------+  Left Venous Findings: +---------+---------------+---------+-----------+----------+------------------+          CompressibilityPhasicitySpontaneityPropertiesSummary            +---------+---------------+---------+-----------+----------+------------------+ CFV      Full           Yes      Yes                                     +---------+---------------+---------+-----------+----------+------------------+  SFJ      Full                                                            +---------+---------------+---------+-----------+----------+------------------+ FV Prox  Full                                                            +---------+---------------+---------+-----------+----------+------------------+ FV Mid   Full                                                             +---------+---------------+---------+-----------+----------+------------------+ FV DistalFull                                                            +---------+---------------+---------+-----------+----------+------------------+ PFV      Full                                                            +---------+---------------+---------+-----------+----------+------------------+ POP      Full           Yes      Yes                                     +---------+---------------+---------+-----------+----------+------------------+ PTV      Full                                         limited                                                                  visualization      +---------+---------------+---------+-----------+----------+------------------+ PERO                                                  Not visualized     +---------+---------------+---------+-----------+----------+------------------+    Summary: Right: There is no evidence of deep vein thrombosis in the lower extremity. No cystic structure found in the popliteal fossa. Left: There is no evidence of deep vein thrombosis in the  lower extremity. No cystic structure found in the popliteal fossa.  *See table(s) above for measurements and observations.    Preliminary    Vas [place] Upper Ext Vein Mapping (pre-op Avf)  Result Date: 02/27/2018 UPPER EXTREMITY DUPLEX STUDY Other Factors: History of kidney transplant Performing Technologist: Alvia Grove RVT  Examination Guidelines: A complete evaluation includes B-mode imaging, spectral Doppler, color Doppler, and power Doppler as needed of all accessible portions of each vessel. Bilateral testing is considered an integral part of a complete examination. Limited examinations for reoccurring indications may be performed as noted.  Right Pre-Dialysis Findings:  +-----------------------+----------+--------------------+---------+--------+ Location               PSV (cm/s)Intralum. Diam. (cm)Waveform Comments +-----------------------+----------+--------------------+---------+--------+ Brachial Antecub. fossa66        0.43                triphasic         +-----------------------+----------+--------------------+---------+--------+ Radial Art at Wrist    56        0.21                triphasic         +-----------------------+----------+--------------------+---------+--------+ Ulnar Art at Wrist     49        0.14                triphasic         +-----------------------+----------+--------------------+---------+--------+ Left Pre-Dialysis Findings: +-----------------------+----------+--------------------+---------+--------+ Location               PSV (cm/s)Intralum. Diam. (cm)Waveform Comments +-----------------------+----------+--------------------+---------+--------+ Brachial Antecub. fossa58        0.42                triphasic         +-----------------------+----------+--------------------+---------+--------+ Radial Art at Wrist    51        0.23                triphasic         +-----------------------+----------+--------------------+---------+--------+ Ulnar Art at Wrist     44        0.19                triphasic         +-----------------------+----------+--------------------+---------+--------+ +------------+-----------------+ Allen's Testbranches at fossa +------------+-----------------+  Summary:   Measurements above. *See table(s) above for measurements and observations.    Preliminary     Procedures .Critical Care Performed by: Duffy Bruce, MD Authorized by: Duffy Bruce, MD   Critical care provider statement:    Critical care time (minutes):  45   Critical care time was exclusive of:  Separately billable procedures and treating other patients and teaching time   Critical care was necessary to  treat or prevent imminent or life-threatening deterioration of the following conditions:  Cardiac failure, circulatory failure, respiratory failure and sepsis   Critical care was time spent personally by me on the following activities:  Development of treatment plan with patient or surrogate, discussions with consultants, evaluation of patient's response to treatment, examination of patient, obtaining history from patient or surrogate, ordering and performing treatments and interventions, ordering and review of laboratory studies, ordering and review of radiographic studies, pulse oximetry, re-evaluation of patient's condition and review of old charts   I assumed direction of critical care for this patient from another provider in my specialty: no     (including critical care time)   Emergency Ultrasound Study:   Angiocath insertion Performed by: Evonnie Pat Consent:  Verbal consent/emergent consent obtained. Risks and benefits: risks, benefits and alternatives were discussed Immediately prior to procedure the correct patient, procedure, equipment, support staff and site/side marked as needed.  Indication: difficult IV access Preparation: Patient was prepped and draped in the usual sterile fashion. Sterile gel was used for this procedure and the ultrasound probe was sterilized prior to use. Vein Location: Right forearm vein was visualized during assessment for potential access sites and was found to be patent/ easily compressed with linear ultrasound.  The needle was visualized with real-time ultrasound and guided into the vein. Gauge: 20 Image saved and stored.  Normal blood return.   Patient tolerance: Patient tolerated the procedure well with no immediate complications.   Medications Ordered in ED Medications  sodium bicarbonate 150 mEq in sterile water 1,000 mL infusion ( Intravenous New Bag/Given 03/01/18 1322)  potassium chloride SA (K-DUR,KLOR-CON) CR tablet 40 mEq (has no  administration in time range)  calcium gluconate 1 g in sodium chloride 0.9 % 100 mL IVPB (1 g Intravenous New Bag/Given 03/01/18 1253)  acetaminophen (TYLENOL) suppository 650 mg (650 mg Rectal Given 03/01/18 1124)  sodium chloride 0.9 % bolus 1,000 mL (0 mLs Intravenous Stopped 03/01/18 1322)  sodium chloride 0.9 % bolus 1,000 mL (1,000 mLs Intravenous New Bag/Given (Non-Interop) 03/01/18 1228)  hydrocortisone sodium succinate (SOLU-CORTEF) 100 MG injection 100 mg (100 mg Intravenous Given 03/01/18 1231)  piperacillin-tazobactam (ZOSYN) IVPB 3.375 g (0 g Intravenous Stopped 03/01/18 1252)  ondansetron (ZOFRAN) injection 4 mg (4 mg Intravenous Given 03/01/18 1231)  fentaNYL (SUBLIMAZE) injection 50 mcg (50 mcg Intravenous Given 03/01/18 1231)     Initial Impression / Assessment and Plan / ED Course  I have reviewed the triage vital signs and the nursing notes.  Pertinent labs & imaging results that were available during my care of the patient were reviewed by me and considered in my medical decision making (see chart for details).  Clinical Course as of Mar 01 1337  Wed Mar 01, 4170  3651 32 year old female with history of renal transplant on immune suppressants, currently being evaluated for return to dialysis due to renal failure, here with severe left leg pain.  On arrival, patient febrile, tachycardic, and tachypneic.  Concern for sepsis secondary to severe left leg cellulitis.  Given the discoloration of the leg and tenderness beyond areas of erythema, stat CT scan obtained and shows no evidence of free air or necrotizing infection.  Lab work shows acute on chronic kidney injury, which I suspect is exacerbated by dehydration.  Bicarb is 13.  Patient given 2 L bolus, and will run sodium bicarb, isotonic, for her renal insufficiency.  Hesitant to fluid overload given edema of bilateral legs.  Broad-spectrum antibiotics started.  Admit to medicine.   [CI]    Clinical Course User Index [CI]  Duffy Bruce, MD      Final Clinical Impressions(s) / ED Diagnoses   Final diagnoses:  Sepsis due to cellulitis [facility])  Acute renal failure superimposed on chronic kidney disease, unspecified CKD stage, unspecified acute renal failure type [facility])    ED Discharge Orders    None       Duffy Bruce, MD 03/01/18 1338

## 2018-03-01 NOTE — H&P (Signed)
History and Physical    [name]:[id number] DOB: 18-Dec-1985 DOA: 03/01/2018  PCP: [name], MD   Patient coming from: Home  Chief Complaint: Home  HPI: [name] is a 32 y.o. female with medical history significant of hypertension, hyperlipidemia, diabetes mellitus type 2 which is uncontrolled and now insulin-dependent, history of ESRD status post renal transplant now chronic kidney disease, she is pseudoseizures, and other comorbidities including learning disability and triple XXX syndrome and other comorbidities presented with severe left leg pain that worsened over the last 2 days and has been rapidly progressing.  Patient states that she noticed it being red and warm and painful and does not know of any trauma.  Pain and redness started spreading to her thigh and then her leg and patient states that any sort of movement is causing her to be in more pain.  She also admits to being weak and when I went to see her she was somnolent but arousable and answers questions appropriately.  She denies any chest pain, lightheadedness or dizziness but did have some nausea and vomiting.  No lightheadedness or dizziness.  No other concerns or complaints at this time and [facility] was called admit this patient for sepsis secondary to left leg cellulitis  ED Course: She had a CT of the femur, chest x-ray, basic blood work done, and also had a lower extremity venous duplex was negative.  Sepsis protocol was initiated and patient given 2 L normal saline bolus and also given IV hydrocortisone.   Review of Systems: As per HPI otherwise 10 point review of systems negative.   Past Medical History:  Diagnosis Date  . Chronic kidney disease   . Diabetes mellitus    Pt reports diagnosis in June 2011  . Hyperlipidemia   . Hypertension   . Kidney transplant recipient   . 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 Pyelonephritis  06/23/2014  . UTI (urinary tract infection) 01/09/2015  . 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 ARTERIOVENOUS GRAFT PLACEMENT Bilateral    "neither work" (10/24/2017)  . CHOLECYSTECTOMY N/A 06/30/2017   Procedure: LAPAROSCOPIC CHOLECYSTECTOMY WITH INTRAOPERATIVE CHOLANGIOGRAM;  Surgeon: Excell Seltzer, MD;  Location: [facility];  Service: General;  Laterality: N/A;  . ESOPHAGOGASTRODUODENOSCOPY (EGD) WITH PROPOFOL N/A 07/04/2017   Procedure: ESOPHAGOGASTRODUODENOSCOPY (EGD) WITH PROPOFOL;  Surgeon: Clarene Essex, MD;  Location: [facility];  Service: Endoscopy;  Laterality: N/A;  . KIDNEY TRANSPLANT  2007  . PARATHYROIDECTOMY  ?2012   "3/4 removed" (10/24/2017)  . RENAL BIOPSY Bilateral 2003   SOCIAL HISTORY  reports that she has never smoked. She has never used smokeless tobacco. She reports that she does not drink alcohol or use drugs.  Allergies  Allergen Reactions  . Benadryl [Diphenhydramine-Zinc Acetate] Shortness Of Breath  . Motrin [Ibuprofen] Shortness Of Breath and Itching    Per pt  . Banana Other (See Comments)    Sick on the stomach  . Diphenhydramine Hcl     REACTION: Stopped breathing in ICU  . Doxycycline     Shortness of Breath   . Iron Dextran     REACTION: vein irritation  . Shellfish Allergy Hives   Family History  Problem Relation Age of Onset  . Arthritis Mother   . Diabetes Mother   . Hypertension  Mother    Prior to Admission medications   Medication Sig Start Date End Date Taking? Authorizing Provider  acetaminophen (TYLENOL) 500 MG tablet Take 1,000 mg by mouth daily as needed for moderate pain.    Yes [provider]  calcitRIOL (ROCALTROL) 0.25 MCG capsule Take 0.25 mcg by mouth daily.   Yes [provider]  calcium acetate (PHOSLO) 667 MG capsule Take 1,334 mg by mouth 3 (three) times daily with meals.   Yes [provider]    Calcium Carb-Cholecalciferol (CALCIUM 1000 + D PO) Take 1 tablet by mouth daily.    Yes [provider]  calcium elemental as carbonate (BARIATRIC TUMS ULTRA) 400 MG chewable tablet Chew 1,000 mg by mouth 3 (three) times daily as needed for heartburn.    Yes [provider]  carvedilol (COREG) 12.5 MG tablet Take 12.5 mg by mouth 2 (two) times daily with a meal.   Yes [provider]  dicyclomine (BENTYL) 20 MG tablet Take 1 tablet (20 mg total) by mouth 2 (two) times daily. 02/06/18  Yes [name], Ripudeep K, MD  famotidine (PEPCID) 10 MG tablet Take 10 mg by mouth daily.   Yes [provider]  fluticasone (FLONASE) 50 MCG/ACT nasal spray Place 2 sprays into both nostrils daily. Patient taking differently: Place 2 sprays into both nostrils daily as needed for allergies.  07/20/15  Yes Muthersbaugh, Jarrett Soho, PA-C  gabapentin (NEURONTIN) 300 MG capsule Take 300 mg by mouth 3 (three) times daily.   Yes [provider]  hydrOXYzine (ATARAX/VISTARIL) 50 MG tablet Take 1 tablet (50 mg total) by mouth 2 (two) times daily as needed for itching. 02/06/18  Yes [name], Ripudeep K, MD  insulin aspart (NOVOLOG FLEXPEN) 100 UNIT/ML FlexPen Inject 12-15 Units into the skin See admin instructions. 5-6 times per day as needed   Yes [provider]  insulin degludec (TRESIBA) 100 UNIT/ML SOPN FlexTouch Pen Inject 0.2 mLs (20 Units total) into the skin at bedtime. Patient taking differently: Inject 60 Units into the skin daily. Reports taking 24 units QAM 10/26/17  Yes Emokpae, Courage, MD  loperamide (IMODIUM) 2 MG capsule Take 2 mg by mouth as needed for diarrhea or loose stools.    Yes [provider]  metoCLOPramide (REGLAN) 10 MG tablet Take 1 tablet (10 mg total) by mouth 3 (three) times daily before meals. 02/06/18  Yes [name], Ripudeep K, MD  mycophenolate (MYFORTIC) 360 MG TBEC Take 720 mg by mouth 2 (two) times daily.    Yes [provider]   predniSONE (DELTASONE) 10 MG tablet Take 1 tablet (10 mg total) by mouth every morning. 02/06/18  Yes [name], Ripudeep K, MD  simvastatin (ZOCOR) 20 MG tablet Take 20 mg by mouth at bedtime.    Yes [provider]  sodium bicarbonate 650 MG tablet Take 650 mg by mouth 3 (three) times daily.   Yes [provider]  tacrolimus (PROGRAF) 1 MG capsule Take 3 mg by mouth 2 (two) times daily.    Yes [provider]  torsemide (DEMADEX) 20 MG tablet Take 4 tablets (80 mg total) by mouth daily. 02/07/18  Yes [name], Ripudeep K, MD  ACCU-CHEK SOFTCLIX LANCETS lancets Use to check blood sugar 4 times per day. 12/29/15   Renato Shin, MD  ondansetron (ZOFRAN ODT) 4 MG disintegrating tablet Take 1 tablet (4 mg total) by mouth every 4 (four) hours as needed for nausea or vomiting. 10/26/17   Roxan Hockey, MD  promethazine (PHENERGAN)  25 MG tablet Take 1 tablet (25 mg total) by mouth every 6 (six) hours as needed for nausea or vomiting. 02/06/18   Mendel Corning, MD   Physical Exam: Vitals:   03/01/18 0958 03/01/18 1122 03/01/18 1224 03/01/18 1258  BP:   117/82 117/82  Pulse:  (!) 121 (!) 117 (!) 113  Resp:  (!) 25 19 (!) 24  Temp:      TempSrc:      SpO2:  98% 98% 100%  Height: 5\' 6"  (1.676 m)      Constitutional: WN/WD obese AAF in NAD and appears calm and somnolent but arousable Eyes: Lids and conjunctivae normal, sclerae anicteric  ENMT: External Ears, Nose appear normal. Grossly normal hearing. Mucous membranes are dry Neck: Appears normal, supple, no cervical masses, normal ROM, no appreciable thyromegaly; no JVD Respiratory: Diminished to auscultation bilaterally, no wheezing, rales, rhonchi or crackles. Normal respiratory effort and patient is not tachypenic. No accessory muscle use.  Cardiovascular: Tachycardic rate, no murmurs / rubs / gallops. S1 and S2 auscultated. 1-2+ LE extremity edema on the left with erythema. Slightly diminished pedal pulses. Abdomen: Soft,  non-tender, Distended. Bowel sounds positive x4. Has abdominal scars from previous surgery GU: Deferred. Musculoskeletal: No clubbing / cyanosis of digits/nails. No joint deformity upper and lower extremities.   Skin: Left Leg warmth and erythema and tenderness. No induration; Warm and dry.  Neurologic: CN 2-12 grossly intact with no focal deficits. Romberg sign and cerebellar reflexes not assessed.  Psychiatric: Normal judgment and insight. Drowsy and somnolnet but normal mood and appropriate affect.   Labs on Admission: I have personally reviewed following labs and imaging studies  CBC: Recent Labs  Lab 03/01/18 1100  WBC 15.1*  NEUTROABS 11.5*  HGB 9.4*  HCT 30.4*  MCV 94.4  PLT 706   Basic Metabolic Panel: Recent Labs  Lab 03/01/18 1100  NA 138  K 3.0*  CL 113*  CO2 13*  GLUCOSE 230*  BUN 108*  CREATININE 5.66*  CALCIUM 4.6*   GFR: Estimated Creatinine Clearance: 15.5 mL/min (A) (by C-G formula based on SCr of 5.66 mg/dL (H)). Liver Function Tests: Recent Labs  Lab 03/01/18 1100  AST 17  ALT 25  ALKPHOS 39  BILITOT 0.7  PROT 6.2*  ALBUMIN 3.0*   No results for input(s): LIPASE, AMYLASE in the last 168 hours. No results for input(s): AMMONIA in the last 168 hours. Coagulation Profile: No results for input(s): INR, PROTIME in the last 168 hours. Cardiac Enzymes: No results for input(s): CKTOTAL, CKMB, CKMBINDEX, TROPONINI in the last 168 hours. BNP (last 3 results) No results for input(s): PROBNP in the last 8760 hours. HbA1C: No results for input(s): HGBA1C in the last 72 hours. CBG: No results for input(s): GLUCAP in the last 168 hours. Lipid Profile: No results for input(s): CHOL, HDL, LDLCALC, TRIG, CHOLHDL, LDLDIRECT in the last 72 hours. Thyroid Function Tests: No results for input(s): TSH, T4TOTAL, FREET4, T3FREE, THYROIDAB in the last 72 hours. Anemia Panel: No results for input(s): VITAMINB12, FOLATE, FERRITIN, TIBC, IRON, RETICCTPCT in the  last 72 hours. Urine analysis:    Component Value Date/Time   COLORURINE STRAW (A) 03/01/2018 1021   APPEARANCEUR CLEAR 03/01/2018 1021   LABSPEC 1.007 03/01/2018 1021   PHURINE 5.0 03/01/2018 1021   GLUCOSEU >=500 (A) 03/01/2018 1021   HGBUR SMALL (A) 03/01/2018 1021   HGBUR negative 01/26/2010 1026   BILIRUBINUR NEGATIVE 03/01/2018 1021   BILIRUBINUR NEG 01/01/2011 [place]  03/01/2018 1021   PROTEINUR 100 (A) 03/01/2018 1021   UROBILINOGEN 0.2 01/09/2015 1333   NITRITE NEGATIVE 03/01/2018 1021   LEUKOCYTESUR TRACE (A) 03/01/2018 1021   Sepsis Labs: !!!!!!!!!!!!!!!!!!!!!!!!!!!!!!!!!!!!!!!!!!!! @LABRCNTIP (procalcitonin:4,lacticidven:4) )No results found for this or any previous visit (from the past 240 hour(s)).   Radiological Exams on Admission: Ct Femur Left Wo Contrast  Result Date: 03/01/2018 CLINICAL DATA:  Bilateral leg pain for 2 days. EXAM: CT OF THE LOWER LEFT EXTREMITY WITHOUT CONTRAST TECHNIQUE: Multidetector CT imaging of the lower left extremity was performed according to the standard protocol. COMPARISON:  None. FINDINGS: Bones/Joint/Cartilage No fracture or dislocation. Normal alignment. No joint effusion. No periosteal reaction or bone destruction. No aggressive osseous lesion. Ligaments Ligaments are suboptimally evaluated by CT. Muscles and Tendons No muscle atrophy.  No intramuscular fluid collection or hematoma. Soft tissue No fluid collection or hematoma. No soft tissue mass. Soft tissue edema in the subcutaneous fat of left thigh with mild skin thickening. IMPRESSION: 1. Nonspecific soft tissue edema in the subcutaneous fat of the left thigh which may reflect cellulitis versus venous insufficiency. Electronically Signed   By: Kathreen Devoid   On: 03/01/2018 11:28   Dg Chest Portable 1 View  Result Date: 03/01/2018 CLINICAL DATA:  Shortness of breath and chest pain EXAM: PORTABLE CHEST 1 VIEW COMPARISON:  02/02/2018 FINDINGS: The heart size and  mediastinal contours are within normal limits. Both lungs are clear. The visualized skeletal structures are unremarkable. IMPRESSION: No active disease. Electronically Signed   By: Kathreen Devoid   On: 03/01/2018 10:48   Vas [place] Lower Extremity Venous (dvt) (only [facility])  Result Date: 03/01/2018  Lower Venous Study Indications: Swelling, and Pain.  Performing Technologist: Abram Sander RVS  Examination Guidelines: A complete evaluation includes B-mode imaging, spectral Doppler, color Doppler, and power Doppler as needed of all accessible portions of each vessel. Bilateral testing is considered an integral part of a complete examination. Limited examinations for reoccurring indications may be performed as noted.  Right Venous Findings: +---------+---------------+---------+-----------+----------+--------------+          CompressibilityPhasicitySpontaneityPropertiesSummary        +---------+---------------+---------+-----------+----------+--------------+ CFV      Full           Yes      Yes                                 +---------+---------------+---------+-----------+----------+--------------+ SFJ      Full                                                        +---------+---------------+---------+-----------+----------+--------------+ FV Prox  Full                                                        +---------+---------------+---------+-----------+----------+--------------+ FV Mid   Full                                                        +---------+---------------+---------+-----------+----------+--------------+  FV DistalFull                                                        +---------+---------------+---------+-----------+----------+--------------+ PFV      Full                                                        +---------+---------------+---------+-----------+----------+--------------+ POP      Full           Yes      Yes                                  +---------+---------------+---------+-----------+----------+--------------+ PTV      Full                                                        +---------+---------------+---------+-----------+----------+--------------+ PERO                                                  Not visualized +---------+---------------+---------+-----------+----------+--------------+  Left Venous Findings: +---------+---------------+---------+-----------+----------+------------------+          CompressibilityPhasicitySpontaneityPropertiesSummary            +---------+---------------+---------+-----------+----------+------------------+ CFV      Full           Yes      Yes                                     +---------+---------------+---------+-----------+----------+------------------+ SFJ      Full                                                            +---------+---------------+---------+-----------+----------+------------------+ FV Prox  Full                                                            +---------+---------------+---------+-----------+----------+------------------+ FV Mid   Full                                                            +---------+---------------+---------+-----------+----------+------------------+ FV DistalFull                                                            +---------+---------------+---------+-----------+----------+------------------+  PFV      Full                                                            +---------+---------------+---------+-----------+----------+------------------+ POP      Full           Yes      Yes                                     +---------+---------------+---------+-----------+----------+------------------+ PTV      Full                                         limited                                                                  visualization       +---------+---------------+---------+-----------+----------+------------------+ PERO                                                  Not visualized     +---------+---------------+---------+-----------+----------+------------------+    Summary: Right: There is no evidence of deep vein thrombosis in the lower extremity. No cystic structure found in the popliteal fossa. Left: There is no evidence of deep vein thrombosis in the lower extremity. No cystic structure found in the popliteal fossa.  *See table(s) above for measurements and observations.    Preliminary    Vas [place] Upper Ext Vein Mapping (pre-op Avf)  Result Date: 02/27/2018 UPPER EXTREMITY DUPLEX STUDY Other Factors: History of kidney transplant Performing Technologist: Alvia Grove RVT  Examination Guidelines: A complete evaluation includes B-mode imaging, spectral Doppler, color Doppler, and power Doppler as needed of all accessible portions of each vessel. Bilateral testing is considered an integral part of a complete examination. Limited examinations for reoccurring indications may be performed as noted.  Right Pre-Dialysis Findings: +-----------------------+----------+--------------------+---------+--------+ Location               PSV (cm/s)Intralum. Diam. (cm)Waveform Comments +-----------------------+----------+--------------------+---------+--------+ Brachial Antecub. fossa66        0.43                triphasic         +-----------------------+----------+--------------------+---------+--------+ Radial Art at Wrist    56        0.21                triphasic         +-----------------------+----------+--------------------+---------+--------+ Ulnar Art at Wrist     49        0.14                triphasic         +-----------------------+----------+--------------------+---------+--------+ Left Pre-Dialysis Findings: +-----------------------+----------+--------------------+---------+--------+ Location  PSV  (cm/s)Intralum. Diam. (cm)Waveform Comments +-----------------------+----------+--------------------+---------+--------+ Brachial Antecub. fossa58        0.42                triphasic         +-----------------------+----------+--------------------+---------+--------+ Radial Art at Wrist    51        0.23                triphasic         +-----------------------+----------+--------------------+---------+--------+ Ulnar Art at Wrist     44        0.19                triphasic         +-----------------------+----------+--------------------+---------+--------+ +------------+-----------------+ Allen's Testbranches at fossa +------------+-----------------+  Summary:   Measurements above. *See table(s) above for measurements and observations.    Preliminary     EKG: Independently reviewed. Showed Sinus Tachycardia at a rate of 123 and some T wave abnormalities on my interpretation.   Assessment/Plan Active Problems:   OBESITY   Anemia due to chronic kidney disease   Essential hypertension, benign   KIDNEY TRANSPLANTATION, HX OF   Renal transplant recipient   Type 1 diabetes mellitus with complication, uncontrolled (HCC)   Gastroparesis   Metabolic acidosis   Hypokalemia   Acute renal failure superimposed on stage 4 chronic kidney disease (HCC)   Cellulitis of left leg   Hyperlipidemia   Hypocalcemia  Sepsis 2/2 to Left Leg Non-purulent Cellulitis -Admitted to the stepdown unit as an inpatient -Patient was febrile, tachypneic, tachycardic on admission and has a source of infection of her left leg -She is immunocompromised and had rapidly progressive cellulitis with concern for next rash however CT scan of the leg showed nonspecific soft tissue edema in the subcutaneous fat in the left thigh which may represent cellulitis versus insufficiency however I feel this is a cellulitis -No extremity duplex was negative for DVT -Check procalcitonin level; WBC on admission was  15.1 -Lactic acid level 0.99 -Check blood cultures, urine culture, and chest x-ray; blood cultures are pending along with urine culture -Continue monitor and follow cultures and sensitivity -Chest x-ray showed no active disease -Urinalysis shows straw-colored urine with greater than 500 glucose, small hemoglobin, trace leukocytes, rare bacteria, 11-20 WBCs -We will place on empiric antibiotics with IV Zosyn and will hold off on IV vancomycin currently -Continue with fluid resuscitation patient was given 2 L of normal saline in the ED and was then placed on a sodium bicarbonate drip at 100 mL's per hour and we have decreased the rate to 75 mL's per hour -patient was given hydrocortisone 100 mg IV in the ED  AKI on CKD Stage 4 with Hx of ESRD s/p Renal Transplant -Patient's BUN/Cr has acutely worsened and is now 108/5.66 with last values of 54/3.95 -C/w IVF Hydration as above -Repeat CMP this afternoon  -Continue with tacrolimus, mycophenolate, and prednisone -Nephrology consulted for further evaluation recommendations -Has AVG Graft but not functioning  -Avoid Nephrotoxic Medications -Repeat CMP in AM   Hypokalemia -Patient's potassium level was 3.0 -Replete with p.o. potassium chloride -Continue monitor and replete as necessary -Repeat CMP in a.m.  Hypocalcemia -In the setting of AKI on CKD -Patient's calcium was 4.6 -Corrected Ca2+ was 5.4 -Given 1 g of calcium gluconate and will get another gram of calcium gluconate and recheck CMP at 6 PM today -Continue monitor replete as necessary -Nephrology consulted for further evaluation recommendations  Non-gap metabolic acidosis -Patient  CO2 was 13 on admission -We will start on bicarbonate drip with 150 mEq of sodium bicarbonate at a rate of 75 mils per hour and continue p.o. home dosing  Obesity -Estimated body mass index is 30.51 kg/m as calculated from the following:   Height as of this encounter: 5\' 6"  (1.676 m).   Weight as  of 02/28/18: 85.7 kg. -Weight Loss Counseling given   Normocytic Anemia -Likely in the setting of chronic kidney disease -Patient is likely hemoconcentrated on admission as she appeared dry -Patient's hemoglobin/hematocrit admission was 9.4/30.4 -Daily monitor for signs of bleeding -Repeat CBC in a.m.  Uncontrolled Diabetes Mellitus complicated by Gastroparesis  -Last hemoglobin A1c was 10.9 -Continue long-acting insulin 20 units subcu daily and place on moderate NovoLog 5 scale and adjust as necessary -Continue with metoclopramide and famotidine -Diet advance diet to clear liquid diet  HTN -Hold Home Coreg 12.5 mg po BID and Torsemide 80 mg po Daily for now  HLD -C/w Simvastqatin 20 mg po qHS  DVT prophylaxis: Heparin 5,000 units sq q8h Code Status: FULL CODE Family Communication: No family present at bedside Disposition Plan: Anticipate D/C back home when patient's leg is improved and labs are back to baseline Consults called: Nephrology Dr. Jonnie Finner Admission status: Inpatient SDU  Severity of Illness: The appropriate patient status for this patient is INPATIENT. Inpatient status is judged to be reasonable and necessary in order to provide the required intensity of service to ensure the patient's safety. The patient's presenting symptoms, physical exam findings, and initial radiographic and laboratory data in the context of their chronic comorbidities is felt to place them at high risk for further clinical deterioration. Furthermore, it is not anticipated that the patient will be medically stable for discharge from the hospital within 2 midnights of admission. The following factors support the patient status of inpatient.   " The patient's presenting symptoms include Left Leg Pain and Tenderness. " The worrisome physical exam findings include Warmth and Tenderness " The initial radiographic and laboratory data are worrisome because of Cellulitis. " The chronic co-morbidities  include as above.  * I certify that at the point of admission it is my clinical judgment that the patient will require inpatient hospital care spanning beyond 2 midnights from the point of admission due to high intensity of service, high risk for further deterioration and high frequency of surveillance required.Kerney Elbe, D.O. [facility] is on [place]  If 7PM-7AM, please contact night-coverage [email or web address] Password [email or web address]  03/01/2018, 2:28 PM

## 2018-03-01 NOTE — ED Notes (Signed)
Megan RN was able to get blood for specimens but not IV access with ultrasound.

## 2018-03-01 NOTE — ED Notes (Signed)
Bed: VG10 Expected date:  Expected time:  Means of arrival:  Comments: EMS- 33yo F, hip pain/no injury

## 2018-03-01 NOTE — Progress Notes (Signed)
Pt's oldest brother came to unit and wanted an update on pt's condition. Pt verbalized that I was allowed to give him medical information without any restrictions.

## 2018-03-01 NOTE — Progress Notes (Signed)
A consult was received from an ED physician for zosyn per pharmacy dosing.  The patient's profile has been reviewed for ht/wt/allergies/indication/available labs.   A one time order has been placed for Zosyn 3.375g.  Further antibiotics/pharmacy consults should be ordered by admitting physician if indicated.                       Thank you, Kara Mead 03/01/2018  10:26 AM

## 2018-03-01 NOTE — Progress Notes (Signed)
Pharmacy Antibiotic Note  [name] is a 32 y.o. female admitted on 03/01/2018 with hx of ESRD, s/p renal tx, diabetes, here with severe left leg pain.  Pharmacy has been consulted for zosyn dosing for cellulitis.  Plan: Zosyn 3.375gm IV x 1 in ED then  2.25gm IV q8h for CrCl < 49mls/min follow renal function and clinical course  Height: 5\' 6"  (167.6 cm) IBW/kg (Calculated) : 59.3  Temp (24hrs), Avg:100.4 F (38 C), Min:100.4 F (38 C), Max:100.4 F (38 C)  Recent Labs  Lab 03/01/18 1100 03/01/18 1106  WBC 15.1*  --   CREATININE 5.66*  --   LATICACIDVEN  --  0.99    Estimated Creatinine Clearance: 15.5 mL/min (A) (by C-G formula based on SCr of 5.66 mg/dL (H)).    Allergies  Allergen Reactions  . Benadryl [Diphenhydramine-Zinc Acetate] Shortness Of Breath  . Motrin [Ibuprofen] Shortness Of Breath and Itching    Per pt  . Banana Other (See Comments)    Sick on the stomach  . Diphenhydramine Hcl     REACTION: Stopped breathing in ICU  . Doxycycline     Shortness of Breath   . Iron Dextran     REACTION: vein irritation  . Shellfish Allergy Hives    Antimicrobials this admission: 12/18 zosyn >> Dose adjustments this admission:   Microbiology results: 12/18 BCx:  12/18 UCx:    Thank you for allowing pharmacy to be a part of this patient's care.  Dolly Rias RPh 03/01/2018, 2:06 PM Pager [phone]

## 2018-03-01 NOTE — ED Notes (Signed)
ED Provider at bedside. 

## 2018-03-01 NOTE — ED Notes (Signed)
Called floor to give report. RN unavailable awaiting a call back.

## 2018-03-01 NOTE — ED Notes (Signed)
Pt adds chest pains for two week. Pt has erythema to left thigh. Pt body feels very warm to touch.

## 2018-03-01 NOTE — ED Notes (Signed)
Stuck pt for IV access without success x 1 attempt.

## 2018-03-01 NOTE — Progress Notes (Signed)
CRITICAL VALUE ALERT  Critical Value:  Magnesium 0.9, Calcium 4.3  Date & Time Notied:  03/01/2018; 1747  Provider Notified: Alfredia Ferguson MD  Orders Received/Actions taken: Awaiting orders

## 2018-03-01 NOTE — Progress Notes (Signed)
*  Preliminary Results* Bilateral lower extremity venous duplex completed. Bilateral lower extremities are negative for deep vein thrombosis. There is no evidence of Baker's cyst bilaterally.  03/01/2018 11:34 AM  Jinny Blossom Dawna Part

## 2018-03-01 NOTE — ED Triage Notes (Signed)
Pt having lots of fluid in her bilat legs and weeping and pain for 2 days. Pt had kidney transplant 10 years ago and waiting for placement of fistula access and possibly starting dialysis again.  Pt reports left eye twitching for week.

## 2018-03-01 NOTE — ED Notes (Signed)
Date and time results received: 03/01/18 1142 (use smartphrase ".now" to insert current time)  Test: Ca+ Critical Value: 4.6  Name of Provider Notified: Dr. Ellender Hose  Orders Received? Or Actions Taken?:

## 2018-03-02 DIAGNOSIS — L0390 Cellulitis, unspecified: Secondary | ICD-10-CM

## 2018-03-02 LAB — GLUCOSE, CAPILLARY
GLUCOSE-CAPILLARY: 119 mg/dL — AB (ref 70–99)
GLUCOSE-CAPILLARY: 224 mg/dL — AB (ref 70–99)
Glucose-Capillary: 62 mg/dL — ABNORMAL LOW (ref 70–99)
Glucose-Capillary: 69 mg/dL — ABNORMAL LOW (ref 70–99)
Glucose-Capillary: 211 mg/dL — ABNORMAL HIGH (ref 70–99)
Glucose-Capillary: 172 mg/dL — ABNORMAL HIGH (ref 70–99)

## 2018-03-02 LAB — COMPREHENSIVE METABOLIC PANEL
ALT: 20 U/L (ref 0–44)
AST: 15 U/L (ref 15–41)
Albumin: 2.5 g/dL — ABNORMAL LOW (ref 3.5–5.0)
Alkaline Phosphatase: 31 U/L — ABNORMAL LOW (ref 38–126)
Anion gap: 14 (ref 5–15)
BUN: 95 mg/dL — ABNORMAL HIGH (ref 6–20)
CO2: 13 mmol/L — ABNORMAL LOW (ref 22–32)
Calcium: 5.2 mg/dL — CL (ref 8.9–10.3)
Chloride: 111 mmol/L (ref 98–111)
Creatinine, Ser: 5.45 mg/dL — ABNORMAL HIGH (ref 0.44–1.00)
GFR calc Af Amer: 11 mL/min — ABNORMAL LOW (ref >60)
GFR calc non Af Amer: 9 mL/min — ABNORMAL LOW (ref >60)
GLUCOSE: 109 mg/dL — AB (ref 70–99)
Potassium: 3.6 mmol/L (ref 3.5–5.1)
Sodium: 138 mmol/L (ref 135–145)
Total Bilirubin: 0.6 mg/dL (ref 0.3–1.2)
Total Protein: 5.7 g/dL — ABNORMAL LOW (ref 6.5–8.1)

## 2018-03-02 LAB — CBC
HCT: 29.1 % — ABNORMAL LOW (ref 36.0–46.0)
Hemoglobin: 8.8 g/dL — ABNORMAL LOW (ref 12.0–15.0)
MCH: 29.2 pg (ref 26.0–34.0)
MCHC: 30.2 g/dL (ref 30.0–36.0)
MCV: 96.7 fL (ref 80.0–100.0)
Platelets: 123 10*3/uL — ABNORMAL LOW (ref 150–400)
RBC: 3.01 MIL/uL — ABNORMAL LOW (ref 3.87–5.11)
RDW: 14.3 % (ref 11.5–15.5)
WBC: 13 10*3/uL — ABNORMAL HIGH (ref 4.0–10.5)
nRBC: 0 % (ref 0.0–0.2)

## 2018-03-02 LAB — URINE CULTURE: Special Requests: NORMAL

## 2018-03-02 LAB — PROCALCITONIN: Procalcitonin: 5.33 ng/mL

## 2018-03-02 LAB — MAGNESIUM: Magnesium: 1.6 mg/dL — ABNORMAL LOW (ref 1.7–2.4)

## 2018-03-02 LAB — PHOSPHORUS: Phosphorus: 5.9 mg/dL — ABNORMAL HIGH (ref 2.5–4.6)

## 2018-03-02 MED ORDER — CALCIUM GLUCONATE-NACL 2-0.675 GM/100ML-% IV SOLN
2.0000 g | Freq: Once | INTRAVENOUS | Status: AC
  Administered 2018-03-02: 2000 mg via INTRAVENOUS
  Filled 2018-03-02: qty 100

## 2018-03-02 MED ORDER — GABAPENTIN 100 MG PO CAPS
200.0000 mg | ORAL_CAPSULE | Freq: Three times a day (TID) | ORAL | Status: DC
  Administered 2018-03-02 – 2018-03-06 (×11): 200 mg via ORAL
  Filled 2018-03-02 (×13): qty 2

## 2018-03-02 MED ORDER — SODIUM CHLORIDE 0.9 % IV SOLN
1.0000 g | INTRAVENOUS | Status: DC
  Administered 2018-03-02 – 2018-03-03 (×2): 1 g via INTRAVENOUS
  Filled 2018-03-02: qty 1
  Filled 2018-03-02: qty 10
  Filled 2018-03-02: qty 1
  Filled 2018-03-02: qty 10

## 2018-03-02 MED ORDER — MAGNESIUM SULFATE 2 GM/50ML IV SOLN
2.0000 g | Freq: Once | INTRAVENOUS | Status: AC
  Administered 2018-03-02: 2 g via INTRAVENOUS

## 2018-03-02 MED ORDER — FUROSEMIDE 10 MG/ML IJ SOLN
80.0000 mg | Freq: Three times a day (TID) | INTRAMUSCULAR | Status: DC
  Administered 2018-03-02 – 2018-03-04 (×7): 80 mg via INTRAVENOUS
  Filled 2018-03-02 (×7): qty 8

## 2018-03-02 MED ORDER — SODIUM BICARBONATE 650 MG PO TABS
650.0000 mg | ORAL_TABLET | Freq: Three times a day (TID) | ORAL | Status: DC
  Administered 2018-03-02 (×3): 650 mg via ORAL
  Filled 2018-03-02 (×3): qty 1

## 2018-03-02 MED ORDER — SODIUM CHLORIDE 0.45 % IV SOLN
INTRAVENOUS | Status: DC
  Filled 2018-03-02: qty 1000

## 2018-03-02 MED ORDER — CALCIUM CARBONATE-VITAMIN D 500-200 MG-UNIT PO TABS
1.0000 | ORAL_TABLET | Freq: Three times a day (TID) | ORAL | Status: DC
  Administered 2018-03-02 – 2018-03-06 (×11): 1 via ORAL
  Filled 2018-03-02 (×12): qty 1

## 2018-03-02 MED ORDER — METOCLOPRAMIDE HCL 5 MG PO TABS
5.0000 mg | ORAL_TABLET | Freq: Three times a day (TID) | ORAL | Status: DC
  Administered 2018-03-02 – 2018-03-06 (×10): 5 mg via ORAL
  Filled 2018-03-02 (×11): qty 1

## 2018-03-02 NOTE — Progress Notes (Signed)
MEDICATION RELATED CONSULT NOTE - INITIAL   Pharmacy Consult for Calcium supplementation IV / PO Indication: Hypocalcemia, CKD  Patient Measurements: Height: 5\' 6"  (167.6 cm) Weight: 199 lb 15.3 oz (90.7 kg) IBW/kg (Calculated) : 59.3  Labs: Recent Labs    03/01/18 1100 03/01/18 1717 03/02/18 0321  WBC 15.1* 14.4* 13.0*  HGB 9.4* 8.2* 8.8*  HCT 30.4* 27.2* 29.1*  PLT 162 146* 123*  CREATININE 5.66* 5.28* 5.45*  MG  --  0.9*  --   PHOS  --  5.2*  --   ALBUMIN 3.0* 2.4* 2.5*  PROT 6.2* 5.4* 5.7*  AST 17 15 15   ALT 25 21 20   ALKPHOS 39 32* 31*  BILITOT 0.7 0.4 0.6   Estimated Creatinine Clearance: 16.5 mL/min (A) (by C-G formula based on SCr of 5.45 mg/dL (H)).   Medical History: Past Medical History:  Diagnosis Date  . Chronic kidney disease   . Diabetes mellitus    Pt reports diagnosis in June 2011  . Hyperlipidemia   . Hypertension   . Kidney transplant recipient   . 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 Pyelonephritis 06/23/2014  . UTI (urinary tract infection) 01/09/2015  . 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   Qualifier: Diagnosis of  By: Carlena Sax  MD, Colletta Maryland      Medications:  Scheduled:  . calcitRIOL  0.25 mcg Oral Daily  . calcium acetate  1,334 mg Oral TID WC  . calcium-vitamin D  1 tablet Oral Daily  . dicyclomine  20 mg Oral BID  . famotidine  10 mg Oral Daily  . fluticasone  2 spray Each Nare Daily  . furosemide  80 mg Intravenous Q8H  . gabapentin  300 mg Oral TID  . heparin  5,000 Units Subcutaneous Q8H  . insulin aspart  0-15 Units Subcutaneous TID WC  . insulin aspart  0-5 Units Subcutaneous QHS  . insulin glargine  20 Units Subcutaneous QHS  . metoCLOPramide  10 mg Oral TID AC  . mycophenolate  720 mg Oral BID  . predniSONE  10 mg Oral BH-q7a  . simvastatin  20 mg Oral QHS  . sodium bicarbonate  650 mg Oral TID  . sodium chloride flush  3 mL Intravenous Q12H  . tacrolimus  3  mg Oral BID   Infusions:  . piperacillin-tazobactam (ZOSYN)  IV Stopped (03/02/18 0343)    Assessment: 67 yoF admitted on 12/18 for cellulitis of LLE.  PMH significant for renal transplant with increasing SCr (nephrology and transplant team suspect poor transplant prognosis) and chronic hypocalcemia.  PTA meds: Phoslo 1,334 mg PO TID with meals  (provides total daily elemental Ca 1014mg ) Oscal/D 1 tab daily (provides total daily elemental Ca 400 mg) Tums 1000 mg TID PRN heartburn Calcitriol 0.25 mcg daily  12/18: Ca 4.3, Corr Ca 5.58 (albumin 2.4).  Phos elevated at 5.2, SCr 5.28, Mag 0.9 Calcium doses:  CaGluc 3g IV total, and Oscal/D 1 tab daily.    12/19:  Ca 5.2, Corr Ca 6.4 (albumin 2.5).  Phos increased to 5.9, SCr up to 5.45, Mag improved to 1.6 Calcium doses: CaGluc 2g IV x1 dose, and Oscal/D 1 tab daily.    Goal of Therapy:  Ca 8.9-10.3  Plan:  IV:  Calcium gluconate 2g x1 dose (in addition to 2g already given today.) Oral: Continue Phoslo 1334 mg TID with meals Increase Oscal/D to 1 tab TID with meals (For hyperphosphatemia  in CKD, the total dose of elemental calcium should not exceed 2 g/day)   Gretta Arab PharmD, BCPS Pager [phone] 03/02/2018 10:14 AM

## 2018-03-02 NOTE — Progress Notes (Signed)
CRITICAL VALUE ALERT  Critical Value:  Calcium 5.2  Date & Time Notied:  03/02/2018 0430  Provider Notified: Schorr  Orders Received/Actions taken: Awaiting further orders

## 2018-03-02 NOTE — Progress Notes (Signed)
Hypoglycemic Event  CBG: 62  Treatment: 4oz Orange Juice given po  Symptoms: lethargic  Follow-up CBG: Time:0900 CBG Result:69  Possible Reasons for Event: Clear liquid diet, not had breakfast yet  Comments/MD notified: Ordered tray     Andree Elk

## 2018-03-02 NOTE — Progress Notes (Signed)
Inpatient Diabetes Program Recommendations  AACE/ADA: New Consensus Statement on Inpatient Glycemic Control (2015)  Target Ranges:  Prepandial:   less than 140 mg/dL      Peak postprandial:   less than 180 mg/dL (1-2 hours)      Critically ill patients:  140 - 180 mg/dL   Lab Results  Component Value Date   GLUCAP 211 (H) 03/02/2018   HGBA1C 10.9 (H) 02/03/2018    Review of Glycemic Control  Diabetes history: DM2 Outpatient Diabetes medications: Tresiba 25 units QAM, Novolog 12-15 units 5-6 times per day Current orders for Inpatient glycemic control: Lantus 20 units QHS, Novolog 0-15 units tidwc and hs  10.9% - uncontrolled Hypoglycemia of 62 and 69 mg/dL this am. Needs insulin adjustment.  Inpatient Diabetes Program Recommendations:     Decrease Novolog to 0-9 units tidwc and hs Decrease Lantus to 16 units QHS Novolog 4 units tidwc for meal coverage insulin if pt eats > 50% meal. Do not give if poor po intake.   Spoke with pt regarding her diabetes and glucose control. Has been counseled regarding HgbA1C of 10.1% at previous hospital admission in November 2019. Pt states she checks blood sugars at different times during the day, but can't seem to control blood sugars. "Sometimes its high, sometimes it's low." Discussed checking blood sugars more frequently and f/u with PCP for any insulin adjustments needed.  Will continue to follow and watch CBG trends.  Thank you. Lorenda Peck, RD, LDN, CDE Inpatient Diabetes Coordinator [phone]

## 2018-03-02 NOTE — Progress Notes (Signed)
Hypoglycemic Event  CBG: 69  Treatment: 4oz Orange Juice  Symptoms: None, patient awake and talking on phone  Follow-up CBG: [id number] CBG Result:119  Possible Reasons for Event:   Comments/MD notified: Tray at bedside, patient eating clear liquids    Andree Elk

## 2018-03-02 NOTE — Progress Notes (Signed)
[facility] PROGRESS NOTE    Progress Note  [name]  ASN:[id number] DOB: Jul 01, 1985 DOA: 03/01/2018 PCP: [name], MD     Brief Narrative:   [name] is an 32 y.o. female past medical history significant for essential hypertension, diabetes mellitus type 2 uncontrolled, end-stage renal disease status post renal transplant, was also history of pseudoseizures who presents with left leg pain which started 2 days prior to admission progressively getting worse, she relates is been erythematous warm painful.  She denies any trauma  Assessment/Plan:   Sepsis secondary to left lower extremity nonpurulent cellulitis: CT of the leg showed soft tissue edema but no abscess. Lower extremity Doppler was negative for DVT. Culture data is pending.  She was started empirically on IV Zosyn. She was fluid resuscitated, she has defervesced, leukocytosis is slowly improving.  Acute on chronic kidney disease stage IV status post transplant: With an unknown baseline, her creatinine has been slowly creeping up in the past year she has been ranging around 3-3.7. Change her IV fluids today half-normal saline with bicarbonate. Continue immunosuppressive therapy.  Awaiting nephrology recommendations. Appreciate nephrology's recommendation. Nephrology to manage fluids, they recommended to decrease her IV fluids. She is significantly fluid overloaded on physical exam.  Hypokalemia: Replete orally now improved.  Hypocalcemia: In the setting of chronic renal disease. She was given IV calcium gluconate, with mild improvement in her calcium. nephrology to continue to manage hypercalcemia.  Non-anion gap metabolic acidosis: She was started on a bicarbonate drip, with minimal improvement. We will start her on oral bicarbonate, and recheck a basic metabolic panel in the morning. Further management per renal.  OBESITY  Anemia due to chronic kidney disease She appeared hemoconcentrated  on admission was fluid resuscitated and her hemoglobin has been ranging from 8.2-8.8.  Which is close to her baseline.  Uncontrolled diabetes mellitus type 2 with gastroparesis: With an A1c of 10.9. She was started on long-acting insulin.  Plus sliding scale with good control.  Gastroparesis DVT prophylaxis: lovenxo Family Communication:none Disposition Plan/Barrier to D/C: once her infection is improved Code Status:     Code Status Orders  (From admission, onward)         Start     Ordered   03/01/18 1658  Full code  Continuous     03/01/18 1658        Code Status History    Date Active Date Inactive Code Status Order ID Comments User Context   02/02/2018 2035 02/06/2018 1624 Full Code [id number]  Albertine Patricia, MD Inpatient   11/11/2017 2325 11/14/2017 2024 Full Code [id number]  Vianne Bulls, MD Inpatient   10/24/2017 1829 10/26/2017 2131 Full Code [id number]  Oswald Hillock, MD ED   10/18/2017 0132 10/18/2017 1559 Full Code [id number]  Jani Gravel, MD ED   07/13/2017 1328 07/15/2017 1800 Full Code [id number]  Annita Brod, MD ED   07/03/2017 0058 07/05/2017 1601 Full Code [id number]  Toy Baker, MD ED   06/29/2017 2113 07/01/2017 1423 Full Code [id number]  Johnathan Hausen, MD Inpatient   12/11/2016 2212 12/18/2016 0036 Full Code [id number]  Vianne Bulls, MD ED   01/09/2015 1655 01/12/2015 2123 Full Code [id number]  Willia Craze, NP Inpatient   01/05/2015 1855 01/07/2015 1530 Full Code [id number]  Florencia Reasons, MD Inpatient   09/30/2014 1313 10/01/2014 2243 Full Code [id number]  Truett Mainland, DO Inpatient   06/24/2014 0031 06/25/2014 2337 Full Code [id number]  Rise Patience, MD Inpatient  12/22/2012 2251 12/23/2012 2223 Full Code [id number]  Toy Baker, MD Inpatient        IV Access:    Peripheral IV   Procedures and diagnostic studies:   Ct Femur Left Wo Contrast  Result Date: 03/01/2018 CLINICAL DATA:  Bilateral leg pain for 2 days. EXAM: CT OF THE  LOWER LEFT EXTREMITY WITHOUT CONTRAST TECHNIQUE: Multidetector CT imaging of the lower left extremity was performed according to the standard protocol. COMPARISON:  None. FINDINGS: Bones/Joint/Cartilage No fracture or dislocation. Normal alignment. No joint effusion. No periosteal reaction or bone destruction. No aggressive osseous lesion. Ligaments Ligaments are suboptimally evaluated by CT. Muscles and Tendons No muscle atrophy.  No intramuscular fluid collection or hematoma. Soft tissue No fluid collection or hematoma. No soft tissue mass. Soft tissue edema in the subcutaneous fat of left thigh with mild skin thickening. IMPRESSION: 1. Nonspecific soft tissue edema in the subcutaneous fat of the left thigh which may reflect cellulitis versus venous insufficiency. Electronically Signed   By: Kathreen Devoid   On: 03/01/2018 11:28   Dg Chest Portable 1 View  Result Date: 03/01/2018 CLINICAL DATA:  Shortness of breath and chest pain EXAM: PORTABLE CHEST 1 VIEW COMPARISON:  02/02/2018 FINDINGS: The heart size and mediastinal contours are within normal limits. Both lungs are clear. The visualized skeletal structures are unremarkable. IMPRESSION: No active disease. Electronically Signed   By: Kathreen Devoid   On: 03/01/2018 10:48   Vas [place] Lower Extremity Venous (dvt) (only [facility])  Result Date: 03/01/2018  Lower Venous Study Indications: Swelling, and Pain.  Performing Technologist: Abram Sander RVS  Examination Guidelines: A complete evaluation includes B-mode imaging, spectral Doppler, color Doppler, and power Doppler as needed of all accessible portions of each vessel. Bilateral testing is considered an integral part of a complete examination. Limited examinations for reoccurring indications may be performed as noted.  Right Venous Findings: +---------+---------------+---------+-----------+----------+--------------+          CompressibilityPhasicitySpontaneityPropertiesSummary         +---------+---------------+---------+-----------+----------+--------------+ CFV      Full           Yes      Yes                                 +---------+---------------+---------+-----------+----------+--------------+ SFJ      Full                                                        +---------+---------------+---------+-----------+----------+--------------+ FV Prox  Full                                                        +---------+---------------+---------+-----------+----------+--------------+ FV Mid   Full                                                        +---------+---------------+---------+-----------+----------+--------------+ FV DistalFull                                                        +---------+---------------+---------+-----------+----------+--------------+  PFV      Full                                                        +---------+---------------+---------+-----------+----------+--------------+ POP      Full           Yes      Yes                                 +---------+---------------+---------+-----------+----------+--------------+ PTV      Full                                                        +---------+---------------+---------+-----------+----------+--------------+ PERO                                                  Not visualized +---------+---------------+---------+-----------+----------+--------------+  Left Venous Findings: +---------+---------------+---------+-----------+----------+------------------+          CompressibilityPhasicitySpontaneityPropertiesSummary            +---------+---------------+---------+-----------+----------+------------------+ CFV      Full           Yes      Yes                                     +---------+---------------+---------+-----------+----------+------------------+ SFJ      Full                                                             +---------+---------------+---------+-----------+----------+------------------+ FV Prox  Full                                                            +---------+---------------+---------+-----------+----------+------------------+ FV Mid   Full                                                            +---------+---------------+---------+-----------+----------+------------------+ FV DistalFull                                                            +---------+---------------+---------+-----------+----------+------------------+ PFV      Full                                                            +---------+---------------+---------+-----------+----------+------------------+  POP      Full           Yes      Yes                                     +---------+---------------+---------+-----------+----------+------------------+ PTV      Full                                         limited                                                                  visualization      +---------+---------------+---------+-----------+----------+------------------+ PERO                                                  Not visualized     +---------+---------------+---------+-----------+----------+------------------+    Summary: Right: There is no evidence of deep vein thrombosis in the lower extremity. No cystic structure found in the popliteal fossa. Left: There is no evidence of deep vein thrombosis in the lower extremity. No cystic structure found in the popliteal fossa.  *See table(s) above for measurements and observations.    Preliminary    Vas [place] Upper Ext Vein Mapping (pre-op Avf)  Result Date: 02/27/2018 UPPER EXTREMITY DUPLEX STUDY Other Factors: History of kidney transplant Performing Technologist: Alvia Grove RVT  Examination Guidelines: A complete evaluation includes B-mode imaging, spectral Doppler, color Doppler, and power Doppler as needed of all  accessible portions of each vessel. Bilateral testing is considered an integral part of a complete examination. Limited examinations for reoccurring indications may be performed as noted.  Right Pre-Dialysis Findings: +-----------------------+----------+--------------------+---------+--------+ Location               PSV (cm/s)Intralum. Diam. (cm)Waveform Comments +-----------------------+----------+--------------------+---------+--------+ Brachial Antecub. fossa66        0.43                triphasic         +-----------------------+----------+--------------------+---------+--------+ Radial Art at Wrist    56        0.21                triphasic         +-----------------------+----------+--------------------+---------+--------+ Ulnar Art at Wrist     49        0.14                triphasic         +-----------------------+----------+--------------------+---------+--------+ Left Pre-Dialysis Findings: +-----------------------+----------+--------------------+---------+--------+ Location               PSV (cm/s)Intralum. Diam. (cm)Waveform Comments +-----------------------+----------+--------------------+---------+--------+ Brachial Antecub. fossa58        0.42                triphasic         +-----------------------+----------+--------------------+---------+--------+ Radial Art at Wrist    51        0.23  triphasic         +-----------------------+----------+--------------------+---------+--------+ Ulnar Art at Wrist     44        0.19                triphasic         +-----------------------+----------+--------------------+---------+--------+ +------------+-----------------+ Allen's Testbranches at fossa +------------+-----------------+  Summary:   Measurements above. *See table(s) above for measurements and observations.    Preliminary      Medical Consultants:    None.  Anti-Infectives:   IV Zosyn  Subjective:    [name],  patient relates that her legs are heavy and tight.  Objective:    Vitals:   03/02/18 0355 03/02/18 0400 03/02/18 0500 03/02/18 0600  BP:  110/85 126/84 123/75  Pulse:  100 (!) 101 (!) 103  Resp:  13 13 15   Temp: 97.7 F (36.5 C)     TempSrc: Axillary     SpO2:  96% 97% 96%  Weight:      Height:        Intake/Output Summary (Last 24 hours) at 03/02/2018 0713 Last data filed at 03/02/2018 0618 Gross per 24 hour  Intake 3321.46 ml  Output -  Net 3321.46 ml   Filed Weights   03/02/18 0255  Weight: 90.7 kg    Exam: General exam: In no acute distress. Respiratory system: Good air movement and basically clear to auscultation Cardiovascular system: S1 & S2 heard, RRR.  Positive JVD Gastrointestinal system: Abdomen is nondistended, soft and nontender.  Central nervous system: Alert and oriented. No focal neurological deficits. Extremities: 3+ lower extremity edema. Skin: No rashes, lesions or ulcers Psychiatry: Judgement and insight appear normal. Mood & affect appropriate.    Data Reviewed:    Labs: Basic Metabolic Panel: Recent Labs  Lab 03/01/18 1100 03/01/18 1717 03/02/18 0321  NA 138 139 138  K 3.0* 3.6 3.6  CL 113* 115* 111  CO2 13* 12* 13*  GLUCOSE 230* 172* 109*  BUN 108* 104* 95*  CREATININE 5.66* 5.28* 5.45*  CALCIUM 4.6* 4.3* 5.2*  MG  --  0.9*  --   PHOS  --  5.2*  --    GFR Estimated Creatinine Clearance: 16.5 mL/min (A) (by C-G formula based on SCr of 5.45 mg/dL (H)). Liver Function Tests: Recent Labs  Lab 03/01/18 1100 03/01/18 1717 03/02/18 0321  AST 17 15 15   ALT 25 21 20   ALKPHOS 39 32* 31*  BILITOT 0.7 0.4 0.6  PROT 6.2* 5.4* 5.7*  ALBUMIN 3.0* 2.4* 2.5*   No results for input(s): LIPASE, AMYLASE in the last 168 hours. No results for input(s): AMMONIA in the last 168 hours. Coagulation profile No results for input(s): INR, PROTIME in the last 168 hours.  CBC: Recent Labs  Lab 03/01/18 1100 03/01/18 1717 03/02/18 0321    WBC 15.1* 14.4* 13.0*  NEUTROABS 11.5* 12.3*  --   HGB 9.4* 8.2* 8.8*  HCT 30.4* 27.2* 29.1*  MCV 94.4 94.8 96.7  PLT 162 146* 123*   Cardiac Enzymes: No results for input(s): CKTOTAL, CKMB, CKMBINDEX, TROPONINI in the last 168 hours. BNP (last 3 results) No results for input(s): PROBNP in the last 8760 hours. CBG: Recent Labs  Lab 03/01/18 1742 03/01/18 2143  GLUCAP 163* 209*   D-Dimer: No results for input(s): DDIMER in the last 72 hours. Hgb A1c: No results for input(s): HGBA1C in the last 72 hours. Lipid Profile: No results for input(s): CHOL, HDL, LDLCALC, TRIG, CHOLHDL, LDLDIRECT in the  last 72 hours. Thyroid function studies: Recent Labs    03/01/18 1717  TSH 0.890   Anemia work up: No results for input(s): VITAMINB12, FOLATE, FERRITIN, TIBC, IRON, RETICCTPCT in the last 72 hours. Sepsis Labs: Recent Labs  Lab 03/01/18 1100 03/01/18 1106 03/01/18 1402 03/01/18 1717 03/02/18 0321  PROCALCITON  --   --   --  4.31 5.33  WBC 15.1*  --   --  14.4* 13.0*  LATICACIDVEN  --  0.99 0.74  --   --    Microbiology Recent Results (from the past 240 hour(s))  Blood Culture (routine x 2)     Status: None (Preliminary result)   Collection Time: 03/01/18 11:00 AM  Result Value Ref Range Status   Specimen Description BLOOD ARM  Final   Special Requests   Final    BOTTLES DRAWN AEROBIC AND ANAEROBIC Blood Culture adequate volume   Culture   Final    NO GROWTH < 24 HOURS Performed at [facility], [place] [address], [place], [place] [address]    Report Status PENDING  Incomplete  Blood Culture (routine x 2)     Status: None (Preliminary result)   Collection Time: 03/01/18  1:46 PM  Result Value Ref Range Status   Specimen Description   Final    BLOOD LEFT HAND Performed at [facility] [address], [place], [place] [address]    Special Requests   Final    BOTTLES DRAWN AEROBIC AND ANAEROBIC Blood Culture adequate volume Performed at  [facility] [address], [place], [place] [address]    Culture   Final    NO GROWTH < 24 HOURS Performed at [facility] [address], [place], [place] [address]    Report Status PENDING  Incomplete  MRSA PCR Screening     Status: None   Collection Time: 03/01/18  4:58 PM  Result Value Ref Range Status   MRSA by PCR NEGATIVE NEGATIVE Final    Comment:        The GeneXpert MRSA Assay (FDA approved for NASAL specimens only), is one component of a comprehensive MRSA colonization surveillance program. It is not intended to diagnose MRSA infection nor to guide or monitor treatment for MRSA infections. Performed at [facility], [place] [address], [place], [place] [address]      Medications:   . calcitRIOL  0.25 mcg Oral Daily  . calcium acetate  1,334 mg Oral TID WC  . calcium-vitamin D  1 tablet Oral Daily  . dicyclomine  20 mg Oral BID  . famotidine  10 mg Oral Daily  . fluticasone  2 spray Each Nare Daily  . gabapentin  300 mg Oral TID  . heparin  5,000 Units Subcutaneous Q8H  . insulin aspart  0-15 Units Subcutaneous TID WC  . insulin aspart  0-5 Units Subcutaneous QHS  . insulin glargine  20 Units Subcutaneous QHS  . metoCLOPramide  10 mg Oral TID AC  . mycophenolate  720 mg Oral BID  . predniSONE  10 mg Oral BH-q7a  . simvastatin  20 mg Oral QHS  . sodium bicarbonate  650 mg Oral Daily  . sodium chloride flush  3 mL Intravenous Q12H  . tacrolimus  3 mg Oral BID   Continuous Infusions: . piperacillin-tazobactam (ZOSYN)  IV Stopped (03/02/18 0343)     LOS: 1 day   [name]  [facility]   *Please refer to  [email or web address], password [email or web address] to get updated schedule on who will round on this patient, as hospitalists switch teams weekly. If 7PM-7AM, please contact night-coverage at [email or web address], password [email or web address] for any overnight needs.  03/02/2018, 7:13 AM

## 2018-03-03 LAB — PROCALCITONIN: PROCALCITONIN: 5.06 ng/mL

## 2018-03-03 LAB — BASIC METABOLIC PANEL
ANION GAP: 16 — AB (ref 5–15)
BUN: 107 mg/dL — ABNORMAL HIGH (ref 6–20)
CO2: 14 mmol/L — AB (ref 22–32)
Calcium: 6.5 mg/dL — ABNORMAL LOW (ref 8.9–10.3)
Chloride: 107 mmol/L (ref 98–111)
Creatinine, Ser: 6.27 mg/dL — ABNORMAL HIGH (ref 0.44–1.00)
GFR calc Af Amer: 9 mL/min — ABNORMAL LOW (ref >60)
GFR calc non Af Amer: 8 mL/min — ABNORMAL LOW (ref >60)
Glucose, Bld: 180 mg/dL — ABNORMAL HIGH (ref 70–99)
Potassium: 3.3 mmol/L — ABNORMAL LOW (ref 3.5–5.1)
Sodium: 137 mmol/L (ref 135–145)

## 2018-03-03 LAB — CBC WITH DIFFERENTIAL/PLATELET
Abs Immature Granulocytes: 0.09 10*3/uL — ABNORMAL HIGH (ref 0.00–0.07)
BASOS PCT: 0 %
Basophils Absolute: 0 10*3/uL (ref 0.0–0.1)
EOS PCT: 2 %
Eosinophils Absolute: 0.3 10*3/uL (ref 0.0–0.5)
HCT: 26.2 % — ABNORMAL LOW (ref 36.0–46.0)
Hemoglobin: 8 g/dL — ABNORMAL LOW (ref 12.0–15.0)
Immature Granulocytes: 1 %
Lymphocytes Relative: 18 %
Lymphs Abs: 2 10*3/uL (ref 0.7–4.0)
MCH: 29.4 pg (ref 26.0–34.0)
MCHC: 30.5 g/dL (ref 30.0–36.0)
MCV: 96.3 fL (ref 80.0–100.0)
Monocytes Absolute: 0.8 10*3/uL (ref 0.1–1.0)
Monocytes Relative: 7 %
Neutro Abs: 7.7 10*3/uL (ref 1.7–7.7)
Neutrophils Relative %: 72 %
Platelets: 123 10*3/uL — ABNORMAL LOW (ref 150–400)
RBC: 2.72 MIL/uL — ABNORMAL LOW (ref 3.87–5.11)
RDW: 14 % (ref 11.5–15.5)
WBC: 10.8 10*3/uL — ABNORMAL HIGH (ref 4.0–10.5)
nRBC: 0 % (ref 0.0–0.2)

## 2018-03-03 LAB — IRON AND TIBC
Iron: 65 ug/dL (ref 28–170)
Saturation Ratios: 52 % — ABNORMAL HIGH (ref 10.4–31.8)
TIBC: 126 ug/dL — ABNORMAL LOW (ref 250–450)
UIBC: 61 ug/dL

## 2018-03-03 LAB — GLUCOSE, CAPILLARY
Glucose-Capillary: 165 mg/dL — ABNORMAL HIGH (ref 70–99)
Glucose-Capillary: 191 mg/dL — ABNORMAL HIGH (ref 70–99)
Glucose-Capillary: 431 mg/dL — ABNORMAL HIGH (ref 70–99)
Glucose-Capillary: 385 mg/dL — ABNORMAL HIGH (ref 70–99)

## 2018-03-03 MED ORDER — METOLAZONE 5 MG PO TABS
5.0000 mg | ORAL_TABLET | Freq: Every day | ORAL | Status: DC
  Administered 2018-03-03 – 2018-03-04 (×2): 5 mg via ORAL
  Filled 2018-03-03 (×2): qty 1

## 2018-03-03 MED ORDER — INSULIN GLARGINE 100 UNIT/ML ~~LOC~~ SOLN: 30.0000 [IU] | Freq: Two times a day (BID) | SUBCUTANEOUS | Status: DC

## 2018-03-03 MED ORDER — INSULIN ASPART 100 UNIT/ML ~~LOC~~ SOLN
20.0000 [IU] | Freq: Once | SUBCUTANEOUS | Status: AC
  Administered 2018-03-03: 20 [IU] via SUBCUTANEOUS

## 2018-03-03 MED ORDER — INSULIN GLARGINE 100 UNIT/ML ~~LOC~~ SOLN: 15.0000 [IU] | Freq: Two times a day (BID) | SUBCUTANEOUS | Status: DC

## 2018-03-03 MED ORDER — INSULIN GLARGINE 100 UNIT/ML ~~LOC~~ SOLN
15.0000 [IU] | Freq: Two times a day (BID) | SUBCUTANEOUS | Status: DC
  Administered 2018-03-03: 15 [IU] via SUBCUTANEOUS
  Filled 2018-03-03 (×2): qty 0.15

## 2018-03-03 MED ORDER — SODIUM BICARBONATE 650 MG PO TABS
650.0000 mg | ORAL_TABLET | Freq: Two times a day (BID) | ORAL | Status: DC
  Administered 2018-03-03 – 2018-03-06 (×7): 650 mg via ORAL
  Filled 2018-03-03 (×7): qty 1

## 2018-03-03 MED ORDER — SODIUM CHLORIDE 0.9 % IV SOLN
INTRAVENOUS | Status: DC | PRN
  Administered 2018-03-03: 250 mL via INTRAVENOUS

## 2018-03-03 NOTE — Care Management Note (Signed)
Case Management Note  Patient Details  Name: [name] MRN: [id number] Date of Birth: 1985/03/25  Subjective/Objective:                  Sepsis secondary to left lower extremity nonpurulent cellulitis: Acute on chronic kidney disease stage IV status post transplant Non-anion gap metabolic acidosis  Discharge readiness is indicated by patient meeting Recovery Milestones, including ALL of the following: ? Hemodynamic stability ? Skin exam stable or improved swelling and redness remain present to lower leg ? Mental status at baseline-yes ? Fever absent or improved absent ? Antibiotic treatment needs appropriate for next level of care iv rocephin ? Pain absent or manageable at next level of care managable ? Ambulatory no ? Oral hydration, medications,[M] and diet ? Iv rocephin, Carb modified liq. diet  Action/Plan: Following for progression of care. Following for cm needs none present at this time.  Expected Discharge Date:  (unknown)               Expected Discharge Plan:  Home/Self Care  In-House Referral:     Discharge planning Services  CM Consult  Post Acute Care Choice:    Choice offered to:     DME Arranged:    DME Agency:     HH Arranged:    HH Agency:     Status of Service:  In process, will continue to follow  If discussed at Long Length of Stay Meetings, dates discussed:    Additional Comments:  Leeroy Cha, RN 03/03/2018, 10:33 AM

## 2018-03-03 NOTE — Progress Notes (Addendum)
[facility] Progress Note  Subjective: no new c/o's, still swelling in legs/ face  Vitals:   03/03/18 0325 03/03/18 0343 03/03/18 0400 03/03/18 0800  BP:   (!) 167/114   Pulse:   (!) 104   Resp:   19   Temp:  98.1 F (36.7 C)  (!) 97.5 F (36.4 C)  TempSrc:  Oral  Oral  SpO2:   99%   Weight: 90.3 kg     Height:        Inpatient medications: . calcitRIOL  0.25 mcg Oral Daily  . calcium acetate  1,334 mg Oral TID WC  . calcium-vitamin D  1 tablet Oral TID PC  . dicyclomine  20 mg Oral BID  . famotidine  10 mg Oral Daily  . fluticasone  2 spray Each Nare Daily  . furosemide  80 mg Intravenous Q8H  . gabapentin  200 mg Oral TID  . heparin  5,000 Units Subcutaneous Q8H  . insulin aspart  0-15 Units Subcutaneous TID WC  . insulin aspart  0-5 Units Subcutaneous QHS  . insulin glargine  15 Units Subcutaneous BID  . metoCLOPramide  5 mg Oral TID AC  . mycophenolate  720 mg Oral BID  . predniSONE  10 mg Oral BH-q7a  . simvastatin  20 mg Oral QHS  . sodium bicarbonate  650 mg Oral TID  . sodium chloride flush  3 mL Intravenous Q12H  . tacrolimus  3 mg Oral BID   . cefTRIAXone (ROCEPHIN)  IV Stopped (03/02/18 2204)   acetaminophen **OR** acetaminophen, albuterol, hydrOXYzine, ondansetron **OR** ondansetron (ZOFRAN) IV, promethazine, senna-docusate  Iron/TIBC/Ferritin/ %Sat    Component Value Date/Time   IRON 79 12/08/2017 1156   TIBC 209 (L) 12/08/2017 1156   FERRITIN 1,383 (H) 12/08/2017 1156   IRONPCTSAT 38 (H) 12/08/2017 1156    Exam: Gen alert, facial puffiness, no distress , calm No rash, cyanosis or gangrene Sclera anicteric, throat clear No jvd or bruits Chest clear bilat to bases, no rales RRR no MRG  Abd soft ntnd no mass or ascites +bs GU defer MS no joint effusions or deformity Ext diffuse mild-mod edema nonpitting of the legs and arms, no wounds or ulcers. 2+ R leg edema, 3+ L leg edema w/ patchy erythema.  Neuro is alert, Ox 3 , nf,  pleasant and appropriate    Home meds:  - tacrolimus 3 bid/ prednisone 10 qd/ mycophenolate 720 bid - torsemide 80 qd/ carvedilol 12.5 bid - insulin degludec 24u qam/ insulin aspart 12-15 ssi ac - prn promethazine/ metoclopramide 10 tid/ dicyclomine 20 bid/ famotidine 10 qd - simvastatin 20 hs - calcium acetate 2ac tid/ calcitriol 0.25 qd/ sodium bicarbonate tid  - gabapentin 300 tid    Impression/ Plan: 1. CKD stage IV of renal transplant/ ESRD/ hx FSGS - on HD from age 52 - age 39 when she had renal transplant in 2007.  Transplant has been failing w/ recent Bx in August. She is not far from requiring dialysis. Creat up a little w/ attempt to diurese.  Cont diuretics as still overloaded, if becomes uremic will need start dialysis.  2. LLE cellulitis - per primary team, improving on exam 3. Volume - continue IV lasix 80 tid , add zaroxolyn 5 qd 4. DM2 on insulin 5. HTN - not a big issue on low dose coreg, bp's good 6. Hypocalcemia - corr Ca+ now = 8, no need further IV Rx 7. Anemia ckd - no bleeding noted, will check  Fe stores and consider esa    Kelly Splinter MD [facility] pager [phone]   03/03/2018, 9:26 AM   Recent Labs  Lab 03/01/18 1717 03/02/18 0321 03/03/18 0843  NA 139 138 137  K 3.6 3.6 3.3*  CL 115* 111 107  CO2 12* 13* 14*  GLUCOSE 172* 109* 180*  BUN 104* 95* PENDING  CREATININE 5.28* 5.45* 6.27*  CALCIUM 4.3* 5.2* 6.5*  PHOS 5.2* 5.9*  --   ALBUMIN 2.4* 2.5*  --    Recent Labs  Lab 03/01/18 1717 03/02/18 0321  AST 15 15  ALT 21 20  ALKPHOS 32* 31*  BILITOT 0.4 0.6  PROT 5.4* 5.7*   Recent Labs  Lab 03/01/18 1717 03/02/18 0321 03/03/18 0843  WBC 14.4* 13.0* 10.8*  NEUTROABS 12.3*  --  7.7  HGB 8.2* 8.8* 8.0*  HCT 27.2* 29.1* 26.2*  MCV 94.8 96.7 96.3  PLT 146* 123* 123*

## 2018-03-03 NOTE — Progress Notes (Signed)
[facility] PROGRESS NOTE    Progress Note  [name]  WGY:[id number] DOB: [birth year] DOA: 03/01/2018 PCP: [name], MD     Brief Narrative:   [name] is an 32 y.o. female past medical history significant for essential hypertension, diabetes mellitus type 2 uncontrolled, end-stage renal disease status post renal transplant, was also history of pseudoseizures who presents with left leg pain which started 2 days prior to admission progressively getting worse, she relates is been erythematous warm painful.  She denies any trauma  Assessment/Plan:   Sepsis secondary to left lower extremity nonpurulent cellulitis: CT of the leg showed soft tissue edema but no abscess. Lower extremity Doppler was negative for DVT. Has remained afebrile blood pressure is stable. She was transitioned to IV Rocephin culture data is pending. CBC is pending this morning.  Acute on chronic kidney disease stage IV status post transplant: With an unknown baseline, her creatinine has been slowly creeping up in the past year she has been ranging around 3-3.7. Continue immunosuppressive therapy.  Awaiting nephrology recommendations. Appreciate nephrology's recommendation. She continues to be significantly fluid overloaded on physical exam.  Hypokalemia: Repleted orally. b-met is pending.  Hypocalcemia: In the setting of chronic renal disease. She was given IV calcium gluconate, with mild improvement in her calcium. nephrology to continue to manage hypercalcemia.  Non-anion gap metabolic acidosis: She was started on a bicarbonate drip, with minimal improvement. Likely due to renal disease, further management per renal.  OBESITY  Anemia due to chronic kidney disease Hemoglobin appears to be at baseline. No signs of overt bleeding.  Uncontrolled diabetes mellitus type 2 with gastroparesis: With an A1c of 10.9. Agrees long-acting insulin continue sliding scale.  Gastroparesis DVT  prophylaxis: lovenxo Family Communication:none Disposition Plan/Barrier to D/C: once her infection is improved Code Status:     Code Status Orders  (From admission, onward)         Start     Ordered   03/01/18 1658  Full code  Continuous     03/01/18 1658        Code Status History    Date Active Date Inactive Code Status Order ID Comments User Context   02/02/2018 2035 02/06/2018 1624 Full Code [id number]  Elgergawy, Silver Huguenin, MD Inpatient   11/11/2017 2325 11/14/2017 2024 Full Code [id number]  Vianne Bulls, MD Inpatient   10/24/2017 1829 10/26/2017 2131 Full Code [id number]  Oswald Hillock, MD ED   10/18/2017 0132 10/18/2017 1559 Full Code [id number]  Jani Gravel, MD ED   07/13/2017 1328 07/15/2017 1800 Full Code [id number]  Annita Brod, MD ED   07/03/2017 0058 07/05/2017 1601 Full Code [id number]  Toy Baker, MD ED   06/29/2017 2113 07/01/2017 1423 Full Code [id number]  Johnathan Hausen, MD Inpatient   12/11/2016 2212 12/18/2016 0036 Full Code [id number]  Vianne Bulls, MD ED   01/09/2015 1655 01/12/2015 2123 Full Code [id number]  Willia Craze, NP Inpatient   01/05/2015 1855 01/07/2015 1530 Full Code [id number]  Florencia Reasons, MD Inpatient   09/30/2014 1313 10/01/2014 2243 Full Code [id number]  Truett Mainland, DO Inpatient   06/24/2014 0031 06/25/2014 2337 Full Code [id number]  Rise Patience, MD Inpatient   12/22/2012 2251 12/23/2012 2223 Full Code [id number]  Toy Baker, MD Inpatient        IV Access:    Peripheral IV   Procedures and diagnostic studies:   Ct Femur Left Wo Contrast  Result Date: 03/01/2018 CLINICAL DATA:  Bilateral  leg pain for 2 days. EXAM: CT OF THE LOWER LEFT EXTREMITY WITHOUT CONTRAST TECHNIQUE: Multidetector CT imaging of the lower left extremity was performed according to the standard protocol. COMPARISON:  None. FINDINGS: Bones/Joint/Cartilage No fracture or dislocation. Normal alignment. No joint effusion. No periosteal reaction or bone  destruction. No aggressive osseous lesion. Ligaments Ligaments are suboptimally evaluated by CT. Muscles and Tendons No muscle atrophy.  No intramuscular fluid collection or hematoma. Soft tissue No fluid collection or hematoma. No soft tissue mass. Soft tissue edema in the subcutaneous fat of left thigh with mild skin thickening. IMPRESSION: 1. Nonspecific soft tissue edema in the subcutaneous fat of the left thigh which may reflect cellulitis versus venous insufficiency. Electronically Signed   By: Kathreen Devoid   On: 03/01/2018 11:28   Dg Chest Portable 1 View  Result Date: 03/01/2018 CLINICAL DATA:  Shortness of breath and chest pain EXAM: PORTABLE CHEST 1 VIEW COMPARISON:  02/02/2018 FINDINGS: The heart size and mediastinal contours are within normal limits. Both lungs are clear. The visualized skeletal structures are unremarkable. IMPRESSION: No active disease. Electronically Signed   By: Kathreen Devoid   On: 03/01/2018 10:48   Vas [place] Lower Extremity Venous (dvt) (only [facility])  Result Date: 03/02/2018  Lower Venous Study Indications: Swelling, and Pain.  Performing Technologist: Abram Sander RVS  Examination Guidelines: A complete evaluation includes B-mode imaging, spectral Doppler, color Doppler, and power Doppler as needed of all accessible portions of each vessel. Bilateral testing is considered an integral part of a complete examination. Limited examinations for reoccurring indications may be performed as noted.  Right Venous Findings: +---------+---------------+---------+-----------+----------+--------------+          CompressibilityPhasicitySpontaneityPropertiesSummary        +---------+---------------+---------+-----------+----------+--------------+ CFV      Full           Yes      Yes                                 +---------+---------------+---------+-----------+----------+--------------+ SFJ      Full                                                         +---------+---------------+---------+-----------+----------+--------------+ FV Prox  Full                                                        +---------+---------------+---------+-----------+----------+--------------+ FV Mid   Full                                                        +---------+---------------+---------+-----------+----------+--------------+ FV DistalFull                                                        +---------+---------------+---------+-----------+----------+--------------+ PFV  Full                                                        +---------+---------------+---------+-----------+----------+--------------+ POP      Full           Yes      Yes                                 +---------+---------------+---------+-----------+----------+--------------+ PTV      Full                                                        +---------+---------------+---------+-----------+----------+--------------+ PERO                                                  Not visualized +---------+---------------+---------+-----------+----------+--------------+  Left Venous Findings: +---------+---------------+---------+-----------+----------+------------------+          CompressibilityPhasicitySpontaneityPropertiesSummary            +---------+---------------+---------+-----------+----------+------------------+ CFV      Full           Yes      Yes                                     +---------+---------------+---------+-----------+----------+------------------+ SFJ      Full                                                            +---------+---------------+---------+-----------+----------+------------------+ FV Prox  Full                                                            +---------+---------------+---------+-----------+----------+------------------+ FV Mid   Full                                                             +---------+---------------+---------+-----------+----------+------------------+ FV DistalFull                                                            +---------+---------------+---------+-----------+----------+------------------+ PFV      Full                                                            +---------+---------------+---------+-----------+----------+------------------+  POP      Full           Yes      Yes                                     +---------+---------------+---------+-----------+----------+------------------+ PTV      Full                                         limited                                                                  visualization      +---------+---------------+---------+-----------+----------+------------------+ PERO                                                  Not visualized     +---------+---------------+---------+-----------+----------+------------------+    Summary: Right: There is no evidence of deep vein thrombosis in the lower extremity. No cystic structure found in the popliteal fossa. Left: There is no evidence of deep vein thrombosis in the lower extremity. No cystic structure found in the popliteal fossa.  *See table(s) above for measurements and observations. Electronically signed by Harold Barban MD on 03/02/2018 at 7:53:28 AM.    Final      Medical Consultants:    None.  Anti-Infectives:   IV Zosyn  Subjective:    [name], relates her leg feels better.  She is hungry.  Objective:    Vitals:   03/03/18 0300 03/03/18 0325 03/03/18 0343 03/03/18 0400  BP: (!) 158/109   (!) 167/114  Pulse: (!) 109   (!) 104  Resp: 18   19  Temp:   98.1 F (36.7 C)   TempSrc:   Oral   SpO2: 96%   99%  Weight:  90.3 kg    Height:        Intake/Output Summary (Last 24 hours) at 03/03/2018 0717 Last data filed at 03/02/2018 2206 Gross per 24 hour  Intake 403.15 ml  Output 850 ml    Net -446.85 ml   Filed Weights   03/02/18 0255 03/03/18 0325  Weight: 90.7 kg 90.3 kg    Exam: General exam: In no acute distress. Respiratory system: Good air movement and basically clear to auscultation Cardiovascular system: S1 & S2 heard, RRR.  Positive JVD Gastrointestinal system: Abdomen is nondistended, soft and nontender.  Central nervous system: Alert and oriented. No focal neurological deficits. Extremities: 3+ lower extremity edema. Skin: No rashes, lesions or ulcers Psychiatry: Judgement and insight appear normal. Mood & affect appropriate.    Data Reviewed:    Labs: Basic Metabolic Panel: Recent Labs  Lab 03/01/18 1100 03/01/18 1717 03/02/18 0321  NA 138 139 138  K 3.0* 3.6 3.6  CL 113* 115* 111  CO2 13* 12* 13*  GLUCOSE 230* 172* 109*  BUN 108* 104* 95*  CREATININE 5.66* 5.28* 5.45*  CALCIUM 4.6* 4.3* 5.2*  MG  --  0.9* 1.6*  PHOS  --  5.2* 5.9*   GFR Estimated Creatinine Clearance: 16.5 mL/min (A) (by C-G formula based on SCr of 5.45 mg/dL (H)). Liver Function Tests: Recent Labs  Lab 03/01/18 1100 03/01/18 1717 03/02/18 0321  AST _0 ALT _1 ALKPHOS 39 32* 31*  BILITOT 0.7 0.4 0.6  PROT 6.2* 5.4* 5.7*  ALBUMIN 3.0* 2.4* 2.5*   No results for input(s): LIPASE, AMYLASE in the last 168 hours. No results for input(s): AMMONIA in the last 168 hours. Coagulation profile No results for input(s): INR, PROTIME in the last 168 hours.  CBC: Recent Labs  Lab 03/01/18 1100 03/01/18 1717 03/02/18 0321  WBC 15.1* 14.4* 13.0*  NEUTROABS 11.5* 12.3*  --   HGB 9.4* 8.2* 8.8*  HCT 30.4* 27.2* 29.1*  MCV 94.4 94.8 96.7  PLT 162 146* 123*   Cardiac Enzymes: No results for input(s): CKTOTAL, CKMB, CKMBINDEX, TROPONINI in the last 168 hours. BNP (last 3 results) No results for input(s): PROBNP in the last 8760 hours. CBG: Recent Labs  Lab 03/02/18 0900 03/02/18 0923 03/02/18 1137 03/02/18 1631 03/02/18 2135  GLUCAP 69* 119*  211* 172* 224*   D-Dimer: No results for input(s): DDIMER in the last 72 hours. Hgb A1c: No results for input(s): HGBA1C in the last 72 hours. Lipid Profile: No results for input(s): CHOL, HDL, LDLCALC, TRIG, CHOLHDL, LDLDIRECT in the last 72 hours. Thyroid function studies: Recent Labs    03/01/18 1717  TSH 0.890   Anemia work up: No results for input(s): VITAMINB12, FOLATE, FERRITIN, TIBC, IRON, RETICCTPCT in the last 72 hours. Sepsis Labs: Recent Labs  Lab 03/01/18 1100 03/01/18 1106 03/01/18 1402 03/01/18 1717 03/02/18 0321 03/03/18 0250  PROCALCITON  --   --   --  4.31 5.33 5.06  WBC 15.1*  --   --  14.4* 13.0*  --   LATICACIDVEN  --  0.99 0.74  --   --   --    Microbiology Recent Results (from the past 240 hour(s))  Urine culture     Status: Abnormal   Collection Time: 03/01/18 10:21 AM  Result Value Ref Range Status   Specimen Description   Final    URINE, CLEAN CATCH Performed at [facility], [place] [address], [place], [place] [address]    Special Requests   Final    Normal Performed at [facility], [place] [address] [place], [place] [address]    Culture MULTIPLE SPECIES PRESENT, SUGGEST RECOLLECTION (A)  Final   Report Status 03/02/2018 FINAL  Final  Blood Culture (routine x 2)     Status: None (Preliminary result)   Collection Time: 03/01/18 11:00 AM  Result Value Ref Range Status   Specimen Description BLOOD ARM  Final   Special Requests   Final    BOTTLES DRAWN AEROBIC AND ANAEROBIC Blood Culture adequate volume   Culture   Final    NO GROWTH < 24 HOURS Performed at [facility], [place] [address], [place], [place] [address]    Report Status PENDING  Incomplete  Blood Culture (routine x 2)     Status: None (Preliminary result)   Collection Time: 03/01/18  1:46 PM  Result Value Ref Range Status   Specimen Description   Final    BLOOD LEFT HAND Performed at [facility] [address], [place], [place] [address]    Special Requests   Final  BOTTLES DRAWN AEROBIC AND ANAEROBIC Blood Culture adequate volume Performed at [facility] [address], [place], [place] [address]    Culture   Final    NO GROWTH < 24 HOURS Performed at [facility] [address], [place], [place] [address]    Report Status PENDING  Incomplete  MRSA PCR Screening     Status: None   Collection Time: 03/01/18  4:58 PM  Result Value Ref Range Status   MRSA by PCR NEGATIVE NEGATIVE Final    Comment:        The GeneXpert MRSA Assay (FDA approved for NASAL specimens only), is one component of a comprehensive MRSA colonization surveillance program. It is not intended to diagnose MRSA infection nor to guide or monitor treatment for MRSA infections. Performed at [facility], [place] [address], [place], [place] [address]      Medications:   . calcitRIOL  0.25 mcg Oral Daily  . calcium acetate  1,334 mg Oral TID WC  . calcium-vitamin D  1 tablet Oral TID PC  . dicyclomine  20 mg Oral BID  . famotidine  10 mg Oral Daily  . fluticasone  2 spray Each Nare Daily  . furosemide  80 mg Intravenous Q8H  . gabapentin  200 mg Oral TID  . heparin  5,000 Units Subcutaneous Q8H  . insulin aspart  0-15 Units Subcutaneous TID WC  . insulin aspart  0-5 Units Subcutaneous QHS  . insulin glargine  20 Units Subcutaneous QHS  . metoCLOPramide  5 mg Oral TID AC  . mycophenolate  720 mg Oral BID  . predniSONE  10 mg Oral BH-q7a  . simvastatin  20 mg Oral QHS  . sodium bicarbonate  650 mg Oral TID  . sodium chloride flush  3 mL Intravenous Q12H  . tacrolimus  3 mg Oral BID   Continuous Infusions: . cefTRIAXone (ROCEPHIN)  IV Stopped (03/02/18 2204)     LOS: 2 days   Charlynne Cousins  [facility]   *Please refer to [email or web address], password [email or web address] to get updated schedule on who will round on this patient, as hospitalists switch teams weekly. If  7PM-7AM, please contact night-coverage at [email or web address], password [email or web address] for any overnight needs.  03/03/2018, 7:17 AM

## 2018-03-03 NOTE — Progress Notes (Signed)
[facility] Progress Note  Subjective: no new c/o's, still swelling in legs/ face  Vitals:   03/03/18 0325 03/03/18 0343 03/03/18 0400 03/03/18 0800  BP:   (!) 167/114   Pulse:   (!) 104   Resp:   19   Temp:  98.1 F (36.7 C)  (!) 97.5 F (36.4 C)  TempSrc:  Oral  Oral  SpO2:   99%   Weight: 90.3 kg     Height:        Inpatient medications: . calcitRIOL  0.25 mcg Oral Daily  . calcium acetate  1,334 mg Oral TID WC  . calcium-vitamin D  1 tablet Oral TID PC  . dicyclomine  20 mg Oral BID  . famotidine  10 mg Oral Daily  . fluticasone  2 spray Each Nare Daily  . furosemide  80 mg Intravenous Q8H  . gabapentin  200 mg Oral TID  . heparin  5,000 Units Subcutaneous Q8H  . insulin aspart  0-15 Units Subcutaneous TID WC  . insulin aspart  0-5 Units Subcutaneous QHS  . insulin glargine  15 Units Subcutaneous BID  . metoCLOPramide  5 mg Oral TID AC  . metolazone  5 mg Oral Daily  . mycophenolate  720 mg Oral BID  . predniSONE  10 mg Oral BH-q7a  . simvastatin  20 mg Oral QHS  . sodium bicarbonate  650 mg Oral BID  . sodium chloride flush  3 mL Intravenous Q12H  . tacrolimus  3 mg Oral BID   . cefTRIAXone (ROCEPHIN)  IV Stopped (03/02/18 2204)   acetaminophen **OR** acetaminophen, albuterol, hydrOXYzine, ondansetron **OR** ondansetron (ZOFRAN) IV, promethazine, senna-docusate  Iron/TIBC/Ferritin/ %Sat    Component Value Date/Time   IRON 79 12/08/2017 1156   TIBC 209 (L) 12/08/2017 1156   FERRITIN 1,383 (H) 12/08/2017 1156   IRONPCTSAT 38 (H) 12/08/2017 1156    Exam: Gen alert, facial puffiness, no distress , calm No rash, cyanosis or gangrene Sclera anicteric, throat clear No jvd or bruits Chest clear bilat to bases, no rales RRR no MRG  Abd soft ntnd no mass or ascites +bs GU defer MS no joint effusions or deformity Ext diffuse mild-mod edema nonpitting of the legs and arms, no wounds or ulcers. 2+ R leg edema, 3+ L leg edema w/ patchy erythema.   Neuro is alert, Ox 3 , nf, pleasant and appropriate    Home meds:  - tacrolimus 3 bid/ prednisone 10 qd/ mycophenolate 720 bid - torsemide 80 qd/ carvedilol 12.5 bid - insulin degludec 24u qam/ insulin aspart 12-15 ssi ac - prn promethazine/ metoclopramide 10 tid/ dicyclomine 20 bid/ famotidine 10 qd - simvastatin 20 hs - calcium acetate 2ac tid/ calcitriol 0.25 qd/ sodium bicarbonate tid  - gabapentin 300 tid    Impression/ Plan: 1. 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  Transplant has been failing w/ recent Bx in August. She is not far from requiring dialysis but not uremic at this point.  Needs diuresis, will start IV lasix 80 tid.   2. LLE cellulitis - per primary team, improving on exam 3. Volume - as above 4. DM2 on insulin 5. HTN - not a big issue on low dose coreg, bp's good 6. Hypocalcemia - corr Ca+ improving 7. Anemia ckd - no bleeding noted   Kelly Splinter MD [facility] pager [phone]   03/02/2018, 9:33  AM   Recent Labs  Lab 03/01/18 1717 03/02/18 0321 03/03/18 0843  NA 139 138 137  K 3.6 3.6 3.3*  CL 115* 111 107  CO2 12* 13* 14*  GLUCOSE 172* 109* 180*  BUN 104* 95* PENDING  CREATININE 5.28* 5.45* 6.27*  CALCIUM 4.3* 5.2* 6.5*  PHOS 5.2* 5.9*  --   ALBUMIN 2.4* 2.5*  --    Recent Labs  Lab 03/01/18 1717 03/02/18 0321  AST 15 15  ALT 21 20  ALKPHOS 32* 31*  BILITOT 0.4 0.6  PROT 5.4* 5.7*   Recent Labs  Lab 03/01/18 1717 03/02/18 0321 03/03/18 0843  WBC 14.4* 13.0* 10.8*  NEUTROABS 12.3*  --  7.7  HGB 8.2* 8.8* 8.0*  HCT 27.2* 29.1* 26.2*  MCV 94.8 96.7 96.3  PLT 146* 123* 123*

## 2018-03-04 LAB — BASIC METABOLIC PANEL
Anion gap: 15 (ref 5–15)
BUN: 108 mg/dL — ABNORMAL HIGH (ref 6–20)
CHLORIDE: 105 mmol/L (ref 98–111)
CO2: 17 mmol/L — ABNORMAL LOW (ref 22–32)
Calcium: 6.8 mg/dL — ABNORMAL LOW (ref 8.9–10.3)
Creatinine, Ser: 6.78 mg/dL — ABNORMAL HIGH (ref 0.44–1.00)
GFR calc Af Amer: 8 mL/min — ABNORMAL LOW (ref >60)
GFR calc non Af Amer: 7 mL/min — ABNORMAL LOW (ref >60)
Glucose, Bld: 246 mg/dL — ABNORMAL HIGH (ref 70–99)
Potassium: 3.3 mmol/L — ABNORMAL LOW (ref 3.5–5.1)
Sodium: 137 mmol/L (ref 135–145)

## 2018-03-04 LAB — GLUCOSE, CAPILLARY
GLUCOSE-CAPILLARY: 391 mg/dL — AB (ref 70–99)
Glucose-Capillary: 317 mg/dL — ABNORMAL HIGH (ref 70–99)
Glucose-Capillary: 405 mg/dL — ABNORMAL HIGH (ref 70–99)
Glucose-Capillary: 388 mg/dL — ABNORMAL HIGH (ref 70–99)

## 2018-03-04 MED ORDER — CALCITRIOL 0.5 MCG PO CAPS
0.5000 ug | ORAL_CAPSULE | Freq: Every day | ORAL | Status: DC
  Administered 2018-03-05 – 2018-03-06 (×2): 0.5 ug via ORAL
  Filled 2018-03-04 (×2): qty 1

## 2018-03-04 MED ORDER — AMOXICILLIN-POT CLAVULANATE 500-125 MG PO TABS
500.0000 mg | ORAL_TABLET | Freq: Two times a day (BID) | ORAL | Status: DC
  Administered 2018-03-04 – 2018-03-06 (×5): 500 mg via ORAL
  Filled 2018-03-04 (×5): qty 1

## 2018-03-04 MED ORDER — AMOXICILLIN-POT CLAVULANATE 875-125 MG PO TABS: 1.0000 | ORAL_TABLET | Freq: Two times a day (BID) | ORAL | Status: DC

## 2018-03-04 MED ORDER — INSULIN GLARGINE 100 UNIT/ML ~~LOC~~ SOLN
25.0000 [IU] | Freq: Two times a day (BID) | SUBCUTANEOUS | Status: DC
  Administered 2018-03-04: 25 [IU] via SUBCUTANEOUS
  Filled 2018-03-04 (×2): qty 0.25

## 2018-03-04 MED ORDER — POTASSIUM CHLORIDE CRYS ER 20 MEQ PO TBCR
40.0000 meq | EXTENDED_RELEASE_TABLET | Freq: Two times a day (BID) | ORAL | Status: AC
  Administered 2018-03-04 (×2): 40 meq via ORAL
  Filled 2018-03-04 (×2): qty 2

## 2018-03-04 NOTE — Progress Notes (Signed)
PHARMACY NOTE -  ANTIBIOTIC RENAL DOSE ADJUSTMENT   Pharmacy to assist with antibiotic renal dose adjustment.  Patient has been initiated on augmentin for LE cellulitis. SCr 6.78 , estimated CrCl 13 ml/min  Plan: Augmentin dose changed from 875 bid to 500 bid for renal fxn. Eudelia Bunch, Pharm.D 03/04/2018 12:18 PM

## 2018-03-04 NOTE — Progress Notes (Addendum)
[facility] PROGRESS NOTE    Progress Note  [name]  GSU:[id number] DOB: January 02, 1986 DOA: 03/01/2018 PCP: [name], MD     Brief Narrative:   [name] is an 32 y.o. female past medical history significant for essential hypertension, diabetes mellitus type 2 uncontrolled, end-stage renal disease status post renal transplant, was also history of pseudoseizures who presents with left leg pain which started 2 days prior to admission progressively getting worse, she relates is been erythematous warm painful.  She denies any trauma  Assessment/Plan:   Sepsis secondary to left lower extremity nonpurulent cellulitis: CT of the leg showed soft tissue edema but no abscess. Lower extremity Doppler was negative for DVT. Has remained afebrile blood pressure is stable. She has remained afebrile leukocytosis improved, her erythema has receded significantly as well as her lower extremity swelling. We will change her to oral Augmentin and monitor overnight.  Acute on chronic kidney disease stage IV status post transplant: With an unknown baseline, her creatinine has been slowly creeping up in the past year. Continue immunosuppressive therapy.   Appreciate nephrology's recommendation. Currently on Lasix and metolazone.  Hypokalemia: We will repeat potassium more aggressively orally. Replete magnesium orally  Hypocalcemia: In the setting of chronic renal disease. She was given IV calcium gluconate, with mild improvement in her calcium. nephrology to continue to manage hypercalcemia.  Non-anion gap metabolic acidosis: She was started on a bicarbonate drip, with minimal improvement. Likely due to renal disease, further management per renal.  OBESITY  Anemia due to chronic kidney disease Hemoglobin appears to be at baseline. No signs of overt bleeding.  Uncontrolled diabetes mellitus type 2 with gastroparesis: With an A1c of 10.9. Increase long-acting insulin  continue sliding scale.  Gastroparesis DVT prophylaxis: lovenxo Family Communication:none Disposition Plan/Barrier to D/C: once her infection is improved Code Status:     Code Status Orders  (From admission, onward)         Start     Ordered   03/01/18 1658  Full code  Continuous     03/01/18 1658        Code Status History    Date Active Date Inactive Code Status Order ID Comments User Context   02/02/2018 2035 02/06/2018 1624 Full Code [id number]  Elgergawy, Silver Huguenin, MD Inpatient   11/11/2017 2325 11/14/2017 2024 Full Code [id number]  Vianne Bulls, MD Inpatient   10/24/2017 1829 10/26/2017 2131 Full Code [id number]  Oswald Hillock, MD ED   10/18/2017 0132 10/18/2017 1559 Full Code [id number]  Jani Gravel, MD ED   07/13/2017 1328 07/15/2017 1800 Full Code [id number]  Annita Brod, MD ED   07/03/2017 0058 07/05/2017 1601 Full Code [id number]  Toy Baker, MD ED   06/29/2017 2113 07/01/2017 1423 Full Code [id number]  Johnathan Hausen, MD Inpatient   12/11/2016 2212 12/18/2016 0036 Full Code [id number]  Vianne Bulls, MD ED   01/09/2015 1655 01/12/2015 2123 Full Code [id number]  Willia Craze, NP Inpatient   01/05/2015 1855 01/07/2015 1530 Full Code [id number]  Florencia Reasons, MD Inpatient   09/30/2014 1313 10/01/2014 2243 Full Code [id number]  Truett Mainland, DO Inpatient   06/24/2014 0031 06/25/2014 2337 Full Code [id number]  Rise Patience, MD Inpatient   12/22/2012 2251 12/23/2012 2223 Full Code [id number]  Toy Baker, MD Inpatient        IV Access:    Peripheral IV   Procedures and diagnostic studies:   No results found.   Medical Consultants:  None.  Anti-Infectives:   IV Zosyn  Subjective:    [name], relates her leg feels better,  Her leg is not painful or tense and not as heavy as it was on admission she relates she can move it better.  Objective:    Vitals:   03/03/18 1605 03/03/18 1606 03/03/18 2053 03/04/18 0521  BP: (!) 153/106 (!)  153/108 (!) 150/98 (!) 152/103  Pulse: 100 (!) 101 (!) 101 96  Resp: 18  17 18   Temp: 98.1 F (36.7 C)  98.3 F (36.8 C) 98.4 F (36.9 C)  TempSrc: Oral  Oral Oral  SpO2: 97%  99% 98%  Weight:    90.6 kg  Height:        Intake/Output Summary (Last 24 hours) at 03/04/2018 1153 Last data filed at 03/04/2018 1030 Gross per 24 hour  Intake 440 ml  Output 1350 ml  Net -910 ml   Filed Weights   03/02/18 0255 03/03/18 0325 03/04/18 0521  Weight: 90.7 kg 90.3 kg 90.6 kg    Exam: General exam: In no acute distress. Respiratory system: Good air movement and clear to auscultation. Cardiovascular system: S1 & S2 heard, RRR.  neagtive JVD Gastrointestinal system: Abdomen is nondistended, soft and nontender.  Central nervous system: Alert and oriented. No focal neurological deficits. Extremities: Trace edema Skin: Erythema has improved. Psychiatry: Judgement and insight appear normal. Mood & affect appropriate.    Data Reviewed:    Labs: Basic Metabolic Panel: Recent Labs  Lab 03/01/18 1100 03/01/18 1717 03/02/18 0321 03/03/18 0843 03/04/18 0349  NA 138 139 138 137 137  K 3.0* 3.6 3.6 3.3* 3.3*  CL 113* 115* 111 107 105  CO2 13* 12* 13* 14* 17*  GLUCOSE 230* 172* 109* 180* 246*  BUN 108* 104* 95* 107* 108*  CREATININE 5.66* 5.28* 5.45* 6.27* 6.78*  CALCIUM 4.6* 4.3* 5.2* 6.5* 6.8*  MG  --  0.9* 1.6*  --   --   PHOS  --  5.2* 5.9*  --   --    GFR Estimated Creatinine Clearance: 13.3 mL/min (A) (by C-G formula based on SCr of 6.78 mg/dL (H)). Liver Function Tests: Recent Labs  Lab 03/01/18 1100 03/01/18 1717 03/02/18 0321  AST 17 15 15   ALT 25 21 20   ALKPHOS 39 32* 31*  BILITOT 0.7 0.4 0.6  PROT 6.2* 5.4* 5.7*  ALBUMIN 3.0* 2.4* 2.5*   No results for input(s): LIPASE, AMYLASE in the last 168 hours. No results for input(s): AMMONIA in the last 168 hours. Coagulation profile No results for input(s): INR, PROTIME in the last 168 hours.  CBC: Recent Labs    Lab 03/01/18 1100 03/01/18 1717 03/02/18 0321 03/03/18 0843  WBC 15.1* 14.4* 13.0* 10.8*  NEUTROABS 11.5* 12.3*  --  7.7  HGB 9.4* 8.2* 8.8* 8.0*  HCT 30.4* 27.2* 29.1* 26.2*  MCV 94.4 94.8 96.7 96.3  PLT 162 146* 123* 123*   Cardiac Enzymes: No results for input(s): CKTOTAL, CKMB, CKMBINDEX, TROPONINI in the last 168 hours. BNP (last 3 results) No results for input(s): PROBNP in the last 8760 hours. CBG: Recent Labs  Lab 03/03/18 0802 03/03/18 1149 03/03/18 1722 03/03/18 2055 03/04/18 0728  GLUCAP 165* 191* 431* 385* 317*   D-Dimer: No results for input(s): DDIMER in the last 72 hours. Hgb A1c: No results for input(s): HGBA1C in the last 72 hours. Lipid Profile: No results for input(s): CHOL, HDL, LDLCALC, TRIG, CHOLHDL, LDLDIRECT in the last 72 hours. Thyroid function  studies: Recent Labs    03/01/18 1717  TSH 0.890   Anemia work up: Recent Labs    03/03/18 0843  TIBC 126*  IRON 65   Sepsis Labs: Recent Labs  Lab 03/01/18 1100 03/01/18 1106 03/01/18 1402 03/01/18 1717 03/02/18 0321 03/03/18 0250 03/03/18 0843  PROCALCITON  --   --   --  4.31 5.33 5.06  --   WBC 15.1*  --   --  14.4* 13.0*  --  10.8*  LATICACIDVEN  --  0.99 0.74  --   --   --   --    Microbiology Recent Results (from the past 240 hour(s))  Urine culture     Status: Abnormal   Collection Time: 03/01/18 10:21 AM  Result Value Ref Range Status   Specimen Description   Final    URINE, CLEAN CATCH Performed at [facility], [place] [address], [place], [place] [address]    Special Requests   Final    Normal Performed at [facility], [place] [address], [place], [place] [address]    Culture MULTIPLE SPECIES PRESENT, SUGGEST RECOLLECTION (A)  Final   Report Status 03/02/2018 FINAL  Final  Blood Culture (routine x 2)     Status: None (Preliminary result)   Collection Time: 03/01/18 11:00 AM  Result Value Ref Range Status   Specimen Description  BLOOD ARM  Final   Special Requests   Final    BOTTLES DRAWN AEROBIC AND ANAEROBIC Blood Culture adequate volume   Culture   Final    NO GROWTH 3 DAYS Performed at [facility], [place] [address] [place], [place] [address]    Report Status PENDING  Incomplete  Blood Culture (routine x 2)     Status: None (Preliminary result)   Collection Time: 03/01/18  1:46 PM  Result Value Ref Range Status   Specimen Description   Final    BLOOD LEFT HAND Performed at [place] [address] [place], [place] [address]    Special Requests   Final    BOTTLES DRAWN AEROBIC AND ANAEROBIC Blood Culture adequate volume Performed at [place] [address], [place], [place] [address]    Culture   Final    NO GROWTH 3 DAYS Performed at [facility], [place] [address], [place], [place] [address]    Report Status PENDING  Incomplete  MRSA PCR Screening     Status: None   Collection Time: 03/01/18  4:58 PM  Result Value Ref Range Status   MRSA by PCR NEGATIVE NEGATIVE Final    Comment:        The GeneXpert MRSA Assay (FDA approved for NASAL specimens only), is one component of a comprehensive MRSA colonization surveillance program. It is not intended to diagnose MRSA infection nor to guide or monitor treatment for MRSA infections. Performed at [facility], [place] [address], [place],  [address]      Medications:   . calcitRIOL  0.25 mcg Oral Daily  . calcium acetate  1,334 mg Oral TID WC  . calcium-vitamin D  1 tablet Oral TID PC  . dicyclomine  20 mg Oral BID  . famotidine  10 mg Oral Daily  . fluticasone  2 spray Each Nare Daily  . furosemide  80 mg Intravenous Q8H  . gabapentin  200 mg Oral TID  . heparin  5,000 Units Subcutaneous Q8H  . insulin aspart  0-15 Units  Subcutaneous TID WC  . insulin aspart  0-5 Units Subcutaneous QHS  . insulin glargine  15 Units Subcutaneous BID  . metoCLOPramide  5 mg Oral  TID AC  . metolazone  5 mg Oral Daily  . mycophenolate  720 mg Oral BID  . predniSONE  10 mg Oral BH-q7a  . simvastatin  20 mg Oral QHS  . sodium bicarbonate  650 mg Oral BID  . sodium chloride flush  3 mL Intravenous Q12H  . tacrolimus  3 mg Oral BID   Continuous Infusions: . sodium chloride 250 mL (03/03/18 2321)  . cefTRIAXone (ROCEPHIN)  IV 1 g (03/03/18 2323)     LOS: 3 days   [name]  [facility]   *Please refer to [email or web address], password [email or web address] to get updated schedule on who will round on this patient, as hospitalists switch teams weekly. If 7PM-7AM, please contact night-coverage at [email or web address], password [email or web address] for any overnight needs.  03/04/2018, 11:53 AM

## 2018-03-04 NOTE — Progress Notes (Addendum)
[facility] Progress Note  Subjective: voiding " a lot" , legs are better   Vitals:   03/03/18 1606 03/03/18 2053 03/04/18 0521 03/04/18 1723  BP: (!) 153/108 (!) 150/98 (!) 152/103 (!) 171/107  Pulse: (!) 101 (!) 101 96 95  Resp:  17 18 18   Temp:  98.3 F (36.8 C) 98.4 F (36.9 C) (!) 97.5 F (36.4 C)  TempSrc:  Oral Oral Oral  SpO2:  99% 98% 99%  Weight:   90.6 kg   Height:        Inpatient medications: . amoxicillin-clavulanate  500 mg Oral Q12H  . [START ON 03/05/2018] calcitRIOL  0.5 mcg Oral Daily  . calcium acetate  1,334 mg Oral TID WC  . calcium-vitamin D  1 tablet Oral TID PC  . dicyclomine  20 mg Oral BID  . famotidine  10 mg Oral Daily  . fluticasone  2 spray Each Nare Daily  . gabapentin  200 mg Oral TID  . heparin  5,000 Units Subcutaneous Q8H  . insulin aspart  0-15 Units Subcutaneous TID WC  . insulin aspart  0-5 Units Subcutaneous QHS  . insulin glargine  25 Units Subcutaneous BID  . metoCLOPramide  5 mg Oral TID AC  . mycophenolate  720 mg Oral BID  . potassium chloride  40 mEq Oral BID  . predniSONE  10 mg Oral BH-q7a  . simvastatin  20 mg Oral QHS  . sodium bicarbonate  650 mg Oral BID  . sodium chloride flush  3 mL Intravenous Q12H  . tacrolimus  3 mg Oral BID   . sodium chloride 250 mL (03/03/18 2321)   sodium chloride, acetaminophen **OR** acetaminophen, albuterol, hydrOXYzine, ondansetron **OR** ondansetron (ZOFRAN) IV, promethazine, senna-docusate  Iron/TIBC/Ferritin/ %Sat    Component Value Date/Time   IRON 65 03/03/2018 0843   TIBC 126 (L) 03/03/2018 0843   FERRITIN 1,383 (H) 12/08/2017 1156   IRONPCTSAT 52 (H) 03/03/2018 6812    Exam: Gen alert, facial puffiness, no distress , calm No rash, cyanosis or gangrene Sclera anicteric, throat clear No jvd or bruits Chest clear bilat to bases, no rales RRR no MRG  Abd soft ntnd no mass or ascites +bs GU defer MS no joint effusions or deformity Ext diffuse mild-mod  edema nonpitting of the legs and arms, no wounds or ulcers. 2+ R leg edema, 3+ L leg edema w/ patchy erythema.  Neuro is alert, Ox 3 , nf, pleasant and appropriate    Home meds:  - tacrolimus 3 bid/ prednisone 10 qd/ mycophenolate 720 bid - torsemide 80 qd/ carvedilol 12.5 bid - insulin degludec 24u qam/ insulin aspart 12-15 ssi ac - prn promethazine/ metoclopramide 10 tid/ dicyclomine 20 bid/ famotidine 10 qd - simvastatin 20 hs - calcium acetate 2ac tid/ calcitriol 0.25 qd/ sodium bicarbonate tid  - gabapentin 300 tid    Impression/ Plan: 1. CKD stage IV of renal transplant/ ESRD/ hx FSGS - on HD from age 72 - age 6 when she had renal transplant in 2007.  Transplant has been failing w/ recent Bx in August. She is not far from requiring dialysis but no indication at this time.  Pt admitted w/ vol overload, better now after 48 hrs IV lasix.  Creat up w/ diuresis, will dc IV lasix and po zaroxolyn.  OK for dc in am if pt clinically stable, we don't need labs again in am, have cancelled.  We will ^ her torsemide from 80 qd to 80mg  bid and  she will try to get labs done late this week on Friday at [facility].  She will f/u w Dr Lorrene Reid.  Will sign off.  2. LLE cellulitis - per primary team, improved/ resolved 3. Volume excess/ edema - better. Counseled pt to limit fluid intake to 4 cups (8 oz) per day max, less if possible.  4. DM2 on insulin 5. HTN - not a big issue on low dose coreg, bp's good, no change to meds 6. Hypocalcemia - corr Ca+ ^ to 8, no need further IV Rx 7. Anemia ckd - no bleeding noted    Kelly Splinter MD [facility] pager [phone]   03/03/2018, 7:52 PM   Recent Labs  Lab 03/01/18 1717 03/02/18 0321 03/03/18 0843 03/04/18 0349  NA 139 138 137 137  K 3.6 3.6 3.3* 3.3*  CL 115* 111 107 105  CO2 12* 13* 14* 17*  GLUCOSE 172* 109* 180* 246*  BUN 104* 95* 107* 108*  CREATININE 5.28* 5.45* 6.27* 6.78*  CALCIUM 4.3* 5.2* 6.5* 6.8*  PHOS 5.2* 5.9*   --   --   ALBUMIN 2.4* 2.5*  --   --    Recent Labs  Lab 03/01/18 1717 03/02/18 0321  AST 15 15  ALT 21 20  ALKPHOS 32* 31*  BILITOT 0.4 0.6  PROT 5.4* 5.7*   Recent Labs  Lab 03/01/18 1717 03/02/18 0321 03/03/18 0843  WBC 14.4* 13.0* 10.8*  NEUTROABS 12.3*  --  7.7  HGB 8.2* 8.8* 8.0*  HCT 27.2* 29.1* 26.2*  MCV 94.8 96.7 96.3  PLT 146* 123* 123*

## 2018-03-05 LAB — GLUCOSE, CAPILLARY
GLUCOSE-CAPILLARY: 259 mg/dL — AB (ref 70–99)
Glucose-Capillary: 350 mg/dL — ABNORMAL HIGH (ref 70–99)
Glucose-Capillary: 405 mg/dL — ABNORMAL HIGH (ref 70–99)
Glucose-Capillary: 393 mg/dL — ABNORMAL HIGH (ref 70–99)

## 2018-03-05 MED ORDER — INSULIN GLARGINE 100 UNIT/ML ~~LOC~~ SOLN
45.0000 [IU] | Freq: Two times a day (BID) | SUBCUTANEOUS | Status: DC
  Filled 2018-03-05: qty 0.45

## 2018-03-05 MED ORDER — TORSEMIDE 20 MG PO TABS: 80.0000 mg | ORAL_TABLET | Freq: Two times a day (BID) | ORAL | 2 refills | Status: DC

## 2018-03-05 MED ORDER — AMOXICILLIN-POT CLAVULANATE 500-125 MG PO TABS: 500.0000 mg | ORAL_TABLET | Freq: Two times a day (BID) | ORAL | 0 refills | Status: DC

## 2018-03-05 MED ORDER — INSULIN GLARGINE 100 UNIT/ML ~~LOC~~ SOLN
45.0000 [IU] | Freq: Two times a day (BID) | SUBCUTANEOUS | Status: DC
  Administered 2018-03-05: 45 [IU] via SUBCUTANEOUS
  Filled 2018-03-05 (×2): qty 0.45

## 2018-03-05 MED ORDER — INSULIN ASPART 100 UNIT/ML ~~LOC~~ SOLN: 0.0000 [IU] | Freq: Three times a day (TID) | SUBCUTANEOUS | Status: DC

## 2018-03-05 MED ORDER — INSULIN DEGLUDEC 100 UNIT/ML ~~LOC~~ SOPN: 60.0000 [IU] | PEN_INJECTOR | Freq: Every day | SUBCUTANEOUS | Status: DC

## 2018-03-05 MED ORDER — INSULIN ASPART 100 UNIT/ML ~~LOC~~ SOLN: 6.0000 [IU] | Freq: Three times a day (TID) | SUBCUTANEOUS | Status: DC

## 2018-03-05 MED ORDER — INSULIN ASPART 100 UNIT/ML ~~LOC~~ SOLN: 15.0000 [IU] | Freq: Once | SUBCUTANEOUS | Status: DC

## 2018-03-05 MED ORDER — INSULIN ASPART 100 UNIT/ML ~~LOC~~ SOLN
0.0000 [IU] | Freq: Every day | SUBCUTANEOUS | Status: DC
  Administered 2018-03-05: 3 [IU] via SUBCUTANEOUS

## 2018-03-05 MED ORDER — INSULIN ASPART 100 UNIT/ML ~~LOC~~ SOLN
25.0000 [IU] | Freq: Once | SUBCUTANEOUS | Status: AC
  Administered 2018-03-05: 25 [IU] via SUBCUTANEOUS

## 2018-03-05 MED ORDER — INSULIN GLARGINE 100 UNIT/ML ~~LOC~~ SOLN
60.0000 [IU] | Freq: Two times a day (BID) | SUBCUTANEOUS | Status: DC
  Administered 2018-03-05 – 2018-03-06 (×2): 60 [IU] via SUBCUTANEOUS
  Filled 2018-03-05 (×2): qty 0.6

## 2018-03-05 MED ORDER — HYDRALAZINE HCL 20 MG/ML IJ SOLN
10.0000 mg | Freq: Four times a day (QID) | INTRAMUSCULAR | Status: DC | PRN
  Administered 2018-03-05: 10 mg via INTRAVENOUS
  Filled 2018-03-05 (×2): qty 1

## 2018-03-05 NOTE — Progress Notes (Signed)
At 1700 patient's [id number]-393, Dr. Venetia Constable called and informed. He ordered 25 units of Novolog be given once and ordered to cancel the discharge  because of the high CBG's. Patient is non compliant with what she eats. Patient was informed and became very angry, stated she wasn't going to take any of her pills that were due. She did take the insulin. Patient asked to speak to Dr. Venetia Constable. I called him and she spoke with him and she decided she would leave AMA. I got the paper ready for her to sign, and brought it to her room. She stated she had talked to her sister and decided she would stay one more night. Dr. Venetia Constable notified. Patient presently sleeping.

## 2018-03-05 NOTE — Discharge Summary (Signed)
Physician Discharge Summary  CLEVA CAMERO LGX:[id number] DOB: [birth year] DOA: 03/01/2018  PCP: [name], MD  Admit date: 03/01/2018 Discharge date: 03/05/2018  Admitted From: home Disposition:  Home  Recommendations for Outpatient Follow-up:  1. Follow-up with transplant center in 1 to 2 weeks check a basic metabolic panel at this time 2. Discussed options about her worsening renal function.  Home Health:no Equipment/Devices:none  Discharge Condition:stable CODE STATUS:full Diet recommendation: Heart Healthy   Brief/Interim Summary: 32 y.o. female past medical history significant for essential hypertension, diabetes mellitus type 2 uncontrolled, end-stage renal disease status post renal transplant, was also history of pseudoseizures who presents with left leg pain which started 2 days prior to admission progressively getting worse, she relates is been erythematous warm painful.  She denies any   Discharge Diagnoses:  Active Problems:   OBESITY   Anemia due to chronic kidney disease   Essential hypertension, benign   KIDNEY TRANSPLANTATION, HX OF   Renal transplant recipient   Type 1 diabetes mellitus with complication, uncontrolled (HCC)   Gastroparesis   Metabolic acidosis   Hypokalemia   Acute renal failure superimposed on stage 4 chronic kidney disease (HCC)   Cellulitis of left leg   Hyperlipidemia   Hypocalcemia Sepsis secondary to left lower extremity nonpurulent cellulitis: CT scan of the lower extremity showed no abscess, lower extremity Doppler was negative for DVT. She was started empirically on IV antibiotics she defervesced her leukocytosis improved, also her erythema receded. She was changed to oral Augmentin and monitor overnight and she had no further fevers.  Acute on chronic kidney disease stage IV status post renal transplant: She was continue on her immunosuppressive therapy, nephrology was consulted due to her worsening creatinine. Over the past  year her creatinine at the beginning of the year was around 3.0-3.7 whichc has been slowly worsening. Nephrology recommendation was to increase her diuretics and follow-up with the transplant center as they think she is rejecting her kidney. She has been oriented to follow-up at the her transplant center, restrict her fluids and was given new prescriptions for her diuretics. Is probably a component of noncompliance.  Hypokalemia: Replete orally now resolved.  Hypocalcemia: Likely due to chronic renal disease this was repleted.  Non-anion gap metabolic acidosis: She is started on bicarbonate drip with minimal improvement, she was changed to oral bicarbonate as per nephrology recommendation to follow-up with with her nephrologist in outpatient.  Anemia due to chronic renal disease: No signs of overt bleeding her hemoglobin has remained stable.  Uncontrolled diabetes mellitus type 2 with gastroparesis:  A1c is 10.9 likely indicating poor adherence or compliance. Even here in the hospital her blood sugar was hard to control her long-acting insulin was increased as below. She will continue Novolog for meal coverage as she was taking before coming into the hospital.  Discharge Instructions  Discharge Instructions    Diet - low sodium heart healthy   Complete by:  As directed    Increase activity slowly   Complete by:  As directed      Allergies as of 03/05/2018      Reactions   Benadryl [diphenhydramine-zinc Acetate] Shortness Of Breath   Motrin [ibuprofen] Shortness Of Breath, Itching   Per pt   Banana Other (See Comments)   Sick on the stomach   Diphenhydramine Hcl    REACTION: Stopped breathing in ICU   Doxycycline    Shortness of Breath    Iron Dextran    REACTION: vein irritation  Shellfish Allergy Hives      Medication List    TAKE these medications   ACCU-CHEK SOFTCLIX LANCETS lancets Use to check blood sugar 4 times per day.   acetaminophen 500 MG  tablet Commonly known as:  TYLENOL Take 1,000 mg by mouth daily as needed for moderate pain.   amoxicillin-clavulanate 500-125 MG tablet Commonly known as:  AUGMENTIN Take 1 tablet (500 mg total) by mouth every 12 (twelve) hours.   calcitRIOL 0.25 MCG capsule Commonly known as:  ROCALTROL Take 0.25 mcg by mouth daily.   CALCIUM 1000 + D PO Take 1 tablet by mouth daily.   calcium acetate 667 MG capsule Commonly known as:  PHOSLO Take 1,334 mg by mouth 3 (three) times daily with meals.   calcium elemental as carbonate 400 MG chewable tablet Commonly known as:  BARIATRIC TUMS ULTRA Chew 1,000 mg by mouth 3 (three) times daily as needed for heartburn.   carvedilol 12.5 MG tablet Commonly known as:  COREG Take 12.5 mg by mouth 2 (two) times daily with a meal.   dicyclomine 20 MG tablet Commonly known as:  BENTYL Take 1 tablet (20 mg total) by mouth 2 (two) times daily.   famotidine 10 MG tablet Commonly known as:  PEPCID Take 10 mg by mouth daily.   fluticasone 50 MCG/ACT nasal spray Commonly known as:  FLONASE Place 2 sprays into both nostrils daily. What changed:    when to take this  reasons to take this   gabapentin 300 MG capsule Commonly known as:  NEURONTIN Take 300 mg by mouth 3 (three) times daily.   hydrOXYzine 50 MG tablet Commonly known as:  ATARAX/VISTARIL Take 1 tablet (50 mg total) by mouth 2 (two) times daily as needed for itching.   insulin degludec 100 UNIT/ML Sopn FlexTouch Pen Commonly known as:  TRESIBA Inject 0.6 mLs (60 Units total) into the skin daily. Reports taking 24 units QAM   loperamide 2 MG capsule Commonly known as:  IMODIUM Take 2 mg by mouth as needed for diarrhea or loose stools.   metoCLOPramide 10 MG tablet Commonly known as:  REGLAN Take 1 tablet (10 mg total) by mouth 3 (three) times daily before meals.   mycophenolate 360 MG Tbec EC tablet Commonly known as:  MYFORTIC Take 720 mg by mouth 2 (two) times daily.    NOVOLOG FLEXPEN 100 UNIT/ML FlexPen Generic drug:  insulin aspart Inject 12-15 Units into the skin See admin instructions. 5-6 times per day as needed   ondansetron 4 MG disintegrating tablet Commonly known as:  ZOFRAN ODT Take 1 tablet (4 mg total) by mouth every 4 (four) hours as needed for nausea or vomiting.   predniSONE 10 MG tablet Commonly known as:  DELTASONE Take 1 tablet (10 mg total) by mouth every morning.   promethazine 25 MG tablet Commonly known as:  PHENERGAN Take 1 tablet (25 mg total) by mouth every 6 (six) hours as needed for nausea or vomiting.   simvastatin 20 MG tablet Commonly known as:  ZOCOR Take 20 mg by mouth at bedtime.   sodium bicarbonate 650 MG tablet Take 650 mg by mouth 3 (three) times daily.   tacrolimus 1 MG capsule Commonly known as:  PROGRAF Take 3 mg by mouth 2 (two) times daily.   torsemide 20 MG tablet Commonly known as:  DEMADEX Take 4 tablets (80 mg total) by mouth 2 (two) times daily. What changed:  when to take this  Allergies  Allergen Reactions  . Benadryl [Diphenhydramine-Zinc Acetate] Shortness Of Breath  . Motrin [Ibuprofen] Shortness Of Breath and Itching    Per pt  . Banana Other (See Comments)    Sick on the stomach  . Diphenhydramine Hcl     REACTION: Stopped breathing in ICU  . Doxycycline     Shortness of Breath   . Iron Dextran     REACTION: vein irritation  . Shellfish Allergy Hives    Consultations:  Nephrology   Procedures/Studies: Ct Abdomen Pelvis Wo Contrast  Result Date: 02/03/2018 CLINICAL DATA:  Complicated pyelonephritis. EXAM: CT ABDOMEN AND PELVIS WITHOUT CONTRAST TECHNIQUE: Multidetector CT imaging of the abdomen and pelvis was performed following the standard protocol without IV contrast. COMPARISON:  11/11/2017 FINDINGS: Lower chest: Mild dependent changes in the lung bases. Hepatobiliary: No focal liver abnormality is seen. Status post cholecystectomy. No biliary dilatation.  Pancreas: Unremarkable. No pancreatic ductal dilatation or surrounding inflammatory changes. Spleen: Normal in size without focal abnormality. Adrenals/Urinary Tract: No adrenal gland nodules. Right kidney is absent, either congenital or surgically. Left kidney is extremely atrophic. 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 No hydronephrosis or hydroureter is seen. There is stranding in the fat around the pelvic right kidney and in the renal hilum which is compatible with pyelonephritis. Low-attenuation zone in the inferior kidney is unchanged since previous study probably representing a cyst although subcapsular collection could also possibly have this appearance. Bladder wall is mildly thickened which may indicate cystitis. No filling defects. Stomach/Bowel: Stomach, small bowel, and colon are mostly decompressed. Scattered stool in the colon. No wall thickening or inflammatory changes. Appendix is normal. Vascular/Lymphatic: No significant vascular findings are present. No enlarged abdominal or pelvic lymph nodes. Reproductive: Status post hysterectomy. No adnexal masses. Other: No free air or free fluid in the abdomen. Abdominal wall musculature is intact. Mild subcutaneous edema. Musculoskeletal: No acute or significant osseous findings. IMPRESSION: 1. Right pelvic renal transplant kidney without hydronephrosis. Stranding in the fat around the pelvic right kidney and in the renal hilum consistent with pyelonephritis. 2. Low-attenuation zone in the inferior kidney is unchanged since previous study probably representing a cyst although subcapsular collection could also have this appearance. No definite abscess. 3. Mild bladder wall thickening may indicate cystitis. 4. No evidence of bowel obstruction or inflammation. Electronically Signed   By: Lucienne Capers M.D.   On: 02/03/2018 22:45   Ct Femur Left Wo Contrast  Result Date: 03/01/2018 CLINICAL DATA:  Bilateral leg pain for 2 days. EXAM: CT OF  THE LOWER LEFT EXTREMITY WITHOUT CONTRAST TECHNIQUE: Multidetector CT imaging of the lower left extremity was performed according to the standard protocol. COMPARISON:  None. FINDINGS: Bones/Joint/Cartilage No fracture or dislocation. Normal alignment. No joint effusion. No periosteal reaction or bone destruction. No aggressive osseous lesion. Ligaments Ligaments are suboptimally evaluated by CT. Muscles and Tendons No muscle atrophy.  No intramuscular fluid collection or hematoma. Soft tissue No fluid collection or hematoma. No soft tissue mass. Soft tissue edema in the subcutaneous fat of left thigh with mild skin thickening. IMPRESSION: 1. Nonspecific soft tissue edema in the subcutaneous fat of the left thigh which may reflect cellulitis versus venous insufficiency. Electronically Signed   By: Kathreen Devoid   On: 03/01/2018 11:28   Dg Chest Portable 1 View  Result Date: 03/01/2018 CLINICAL DATA:  Shortness of breath and chest pain EXAM: PORTABLE CHEST 1 VIEW COMPARISON:  02/02/2018 FINDINGS: The heart size and mediastinal contours are within normal  limits. Both lungs are clear. The visualized skeletal structures are unremarkable. IMPRESSION: No active disease. Electronically Signed   By: Kathreen Devoid   On: 03/01/2018 10:48   Vas [place] Upper Extremity Arterial Duplex  Result Date: 02/27/2018 UPPER EXTREMITY DUPLEX STUDY Other Factors: History of kidney transplant Performing Technologist: Alvia Grove RVT  Examination Guidelines: A complete evaluation includes B-mode imaging, spectral Doppler, color Doppler, and power Doppler as needed of all accessible portions of each vessel. Bilateral testing is considered an integral part of a complete examination. Limited examinations for reoccurring indications may be performed as noted.  Right Pre-Dialysis Findings: +-----------------------+----------+--------------------+---------+--------+ Location               PSV (cm/s)Intralum. Diam. (cm)Waveform Comments  +-----------------------+----------+--------------------+---------+--------+ Brachial Antecub. fossa66        0.43                triphasic         +-----------------------+----------+--------------------+---------+--------+ Radial Art at Wrist    56        0.21                triphasic         +-----------------------+----------+--------------------+---------+--------+ Ulnar Art at Wrist     49        0.14                triphasic         +-----------------------+----------+--------------------+---------+--------+ Left Pre-Dialysis Findings: +-----------------------+----------+--------------------+---------+--------+ Location               PSV (cm/s)Intralum. Diam. (cm)Waveform Comments +-----------------------+----------+--------------------+---------+--------+ Brachial Antecub. fossa58        0.42                triphasic         +-----------------------+----------+--------------------+---------+--------+ Radial Art at Wrist    51        0.23                triphasic         +-----------------------+----------+--------------------+---------+--------+ Ulnar Art at Wrist     44        0.19                triphasic         +-----------------------+----------+--------------------+---------+--------+ +------------+-----------------+ Allen[employer] Testbranches at fossa +------------+-----------------+  Summary:   Measurements above. *See table(s) above for measurements and observations.    Preliminary    Vas [place] Lower Extremity Venous (dvt) (only [facility])  Result Date: 03/02/2018  Lower Venous Study Indications: Swelling, and Pain.  Performing Technologist: Abram Sander RVS  Examination Guidelines: A complete evaluation includes B-mode imaging, spectral Doppler, color Doppler, and power Doppler as needed of all accessible portions of each vessel. Bilateral testing is considered an integral part of a complete examination. Limited examinations for reoccurring indications may be  performed as noted.  Right Venous Findings: +---------+---------------+---------+-----------+----------+--------------+          CompressibilityPhasicitySpontaneityPropertiesSummary        +---------+---------------+---------+-----------+----------+--------------+ CFV      Full           Yes      Yes                                 +---------+---------------+---------+-----------+----------+--------------+ SFJ      Full                                                        +---------+---------------+---------+-----------+----------+--------------+  FV Prox  Full                                                        +---------+---------------+---------+-----------+----------+--------------+ FV Mid   Full                                                        +---------+---------------+---------+-----------+----------+--------------+ FV DistalFull                                                        +---------+---------------+---------+-----------+----------+--------------+ PFV      Full                                                        +---------+---------------+---------+-----------+----------+--------------+ POP      Full           Yes      Yes                                 +---------+---------------+---------+-----------+----------+--------------+ PTV      Full                                                        +---------+---------------+---------+-----------+----------+--------------+ PERO                                                  Not visualized +---------+---------------+---------+-----------+----------+--------------+  Left Venous Findings: +---------+---------------+---------+-----------+----------+------------------+          CompressibilityPhasicitySpontaneityPropertiesSummary            +---------+---------------+---------+-----------+----------+------------------+ CFV      Full           Yes      Yes                                      +---------+---------------+---------+-----------+----------+------------------+ SFJ      Full                                                            +---------+---------------+---------+-----------+----------+------------------+ FV Prox  Full                                                            +---------+---------------+---------+-----------+----------+------------------+  FV Mid   Full                                                            +---------+---------------+---------+-----------+----------+------------------+ FV DistalFull                                                            +---------+---------------+---------+-----------+----------+------------------+ PFV      Full                                                            +---------+---------------+---------+-----------+----------+------------------+ POP      Full           Yes      Yes                                     +---------+---------------+---------+-----------+----------+------------------+ PTV      Full                                         limited                                                                  visualization      +---------+---------------+---------+-----------+----------+------------------+ PERO                                                  Not visualized     +---------+---------------+---------+-----------+----------+------------------+    Summary: Right: There is no evidence of deep vein thrombosis in the lower extremity. No cystic structure found in the popliteal fossa. Left: There is no evidence of deep vein thrombosis in the lower extremity. No cystic structure found in the popliteal fossa.  *See table(s) above for measurements and observations. Electronically signed by Harold Barban MD on 03/02/2018 at 7:53:28 AM.    Final    Vas [place] Upper Ext Vein Mapping (pre-op Avf)  Result Date:  02/27/2018 UPPER EXTREMITY DUPLEX STUDY Other Factors: History of kidney transplant Performing Technologist: Alvia Grove RVT  Examination Guidelines: A complete evaluation includes B-mode imaging, spectral Doppler, color Doppler, and power Doppler as needed of all accessible portions of each vessel. Bilateral testing is considered an integral part of a complete examination. Limited examinations for reoccurring indications may be performed as noted.  Right Pre-Dialysis Findings: +-----------------------+----------+--------------------+---------+--------+ Location               PSV (cm/s)Intralum.  Diam. (cm)Waveform Comments +-----------------------+----------+--------------------+---------+--------+ Brachial Antecub. fossa66        0.43                triphasic         +-----------------------+----------+--------------------+---------+--------+ Radial Art at Wrist    56        0.21                triphasic         +-----------------------+----------+--------------------+---------+--------+ Ulnar Art at Wrist     49        0.14                triphasic         +-----------------------+----------+--------------------+---------+--------+ Left Pre-Dialysis Findings: +-----------------------+----------+--------------------+---------+--------+ Location               PSV (cm/s)Intralum. Diam. (cm)Waveform Comments +-----------------------+----------+--------------------+---------+--------+ Brachial Antecub. fossa58        0.42                triphasic         +-----------------------+----------+--------------------+---------+--------+ Radial Art at Wrist    51        0.23                triphasic         +-----------------------+----------+--------------------+---------+--------+ Ulnar Art at Wrist     44        0.19                triphasic         +-----------------------+----------+--------------------+---------+--------+ +------------+-----------------+ Allen's  Testbranches at fossa +------------+-----------------+  Summary:   Measurements above. *See table(s) above for measurements and observations.    Preliminary    (Echo, Carotid, EGD, Colonoscopy, ERCP)    Subjective: She feels great no new complaints.  Discharge Exam: Vitals:   03/05/18 0110 03/05/18 0515  BP: (!) 171/109 (!) 139/97  Pulse: 100 90  Resp: 16 17  Temp:  98 F (36.7 C)  SpO2: 95% 99%   Vitals:   03/04/18 1723 03/04/18 2055 03/05/18 0110 03/05/18 0515  BP: (!) 171/107 (!) 169/111 (!) 171/109 (!) 139/97  Pulse: 95 95 100 90  Resp: 18 17 16 17   Temp: (!) 97.5 F (36.4 C) 98.9 F (37.2 C)  98 F (36.7 C)  TempSrc: Oral Oral  Oral  SpO2: 99% 98% 95% 99%  Weight:      Height:        General: Pt is alert, awake, not in acute distress Cardiovascular: RRR, S1/S2 +, no rubs, no gallops Respiratory: CTA bilaterally, no wheezing, no rhonchi Abdominal: Soft, NT, ND, bowel sounds + Extremities: no edema, no cyanosis    The results of significant diagnostics from this hospitalization (including imaging, microbiology, ancillary and laboratory) are listed below for reference.     Microbiology: Recent Results (from the past 240 hour(s))  Urine culture     Status: Abnormal   Collection Time: 03/01/18 10:21 AM  Result Value Ref Range Status   Specimen Description   Final    URINE, CLEAN CATCH Performed at [facility], [place] [address], [place], [place] [address]    Special Requests   Final    Normal Performed at [facility], [place] [address], [place], [place] [address]    Culture MULTIPLE SPECIES PRESENT, SUGGEST RECOLLECTION (A)  Final   Report Status 03/02/2018 FINAL  Final  Blood Culture (routine x 2)     Status: None (Preliminary result)  Collection Time: 03/01/18 11:00 AM  Result Value Ref Range Status   Specimen Description BLOOD RIGHT ANTECUBITAL  Final   Special Requests   Final    BOTTLES DRAWN AEROBIC AND  ANAEROBIC Blood Culture adequate volume   Culture   Final    NO GROWTH 4 DAYS Performed at [facility], [address] [place], [place] [address]    Report Status PENDING  Incomplete  Blood Culture (routine x 2)     Status: None (Preliminary result)   Collection Time: 03/01/18  1:46 PM  Result Value Ref Range Status   Specimen Description   Final    BLOOD LEFT HAND Performed at [place] [address] [place], [place] [address]    Special Requests   Final    BOTTLES DRAWN AEROBIC AND ANAEROBIC Blood Culture adequate volume Performed at [place] [address] [place], [place] [address]    Culture   Final    NO GROWTH 4 DAYS Performed at [facility], [place] [address] [place], [place] [address]    Report Status PENDING  Incomplete  MRSA PCR Screening     Status: None   Collection Time: 03/01/18  4:58 PM  Result Value Ref Range Status   MRSA by PCR NEGATIVE NEGATIVE Final    Comment:        The GeneXpert MRSA Assay (FDA approved for NASAL specimens only), is one component of a comprehensive MRSA colonization surveillance program. It is not intended to diagnose MRSA infection nor to guide or monitor treatment for MRSA infections. Performed at [facility], [place] [address], [place], [place] [address]      Labs: BNP (last 3 results) Recent Labs    03/01/18 1100  BNP 84.1   Basic Metabolic Panel: Recent Labs  Lab 03/01/18 1100 03/01/18 1717 03/02/18 0321 03/03/18 0843 03/04/18 0349  NA 138 139 138 137 137  K 3.0* 3.6 3.6 3.3* 3.3*  CL 113* 115* 111 107 105  CO2 13* 12* 13* 14* 17*  GLUCOSE 230* 172* 109* 180* 246*  BUN 108* 104* 95* 107* 108*  CREATININE 5.66* 5.28* 5.45* 6.27* 6.78*  CALCIUM 4.6* 4.3* 5.2* 6.5* 6.8*  MG  --  0.9* 1.6*  --   --   PHOS  --  5.2* 5.9*  --   --    Liver Function Tests: Recent Labs  Lab 03/01/18 1100 03/01/18 1717 03/02/18 0321  AST 17 15 15    ALT 25 21 20   ALKPHOS 39 32* 31*  BILITOT 0.7 0.4 0.6  PROT 6.2* 5.4* 5.7*  ALBUMIN 3.0* 2.4* 2.5*   No results for input(s): LIPASE, AMYLASE in the last 168 hours. No results for input(s): AMMONIA in the last 168 hours. CBC: Recent Labs  Lab 03/01/18 1100 03/01/18 1717 03/02/18 0321 03/03/18 0843  WBC 15.1* 14.4* 13.0* 10.8*  NEUTROABS 11.5* 12.3*  --  7.7  HGB 9.4* 8.2* 8.8* 8.0*  HCT 30.4* 27.2* 29.1* 26.2*  MCV 94.4 94.8 96.7 96.3  PLT 162 146* 123* 123*   Cardiac Enzymes: No results for input(s): CKTOTAL, CKMB, CKMBINDEX, TROPONINI in the last 168 hours. BNP: Invalid input(s): POCBNP CBG: Recent Labs  Lab 03/04/18 0728 03/04/18 1211 03/04/18 1722 03/04/18 2055 03/05/18 0733  GLUCAP 317* 405* 391* 388* 350*   D-Dimer No results for input(s): DDIMER in the last 72 hours. Hgb A1c No results for input(s): HGBA1C in the last 72 hours. Lipid Profile  No results for input(s): CHOL, HDL, LDLCALC, TRIG, CHOLHDL, LDLDIRECT in the last 72 hours. Thyroid function studies No results for input(s): TSH, T4TOTAL, T3FREE, THYROIDAB in the last 72 hours.  Invalid input(s): FREET3 Anemia work up Recent Labs    03/03/18 0843  TIBC 126*  IRON 65   Urinalysis    Component Value Date/Time   COLORURINE STRAW (A) 03/01/2018 1021   APPEARANCEUR CLEAR 03/01/2018 1021   LABSPEC 1.007 03/01/2018 1021   PHURINE 5.0 03/01/2018 1021   GLUCOSEU >=500 (A) 03/01/2018 1021   HGBUR SMALL (A) 03/01/2018 1021   HGBUR negative 01/26/2010 1026   BILIRUBINUR NEGATIVE 03/01/2018 1021   BILIRUBINUR NEG 01/01/2011 [place] 03/01/2018 1021   PROTEINUR 100 (A) 03/01/2018 1021   UROBILINOGEN 0.2 01/09/2015 1333   NITRITE NEGATIVE 03/01/2018 1021   LEUKOCYTESUR TRACE (A) 03/01/2018 1021   Sepsis Labs Invalid input(s): PROCALCITONIN,  WBC,  LACTICIDVEN Microbiology Recent Results (from the past 240 hour(s))  Urine culture     Status: Abnormal   Collection Time:  03/01/18 10:21 AM  Result Value Ref Range Status   Specimen Description   Final    URINE, CLEAN CATCH Performed at [facility], [place] [address], [place], [place] [address]    Special Requests   Final    Normal Performed at [facility], [place] [address], [place], [place] [address]    Culture MULTIPLE SPECIES PRESENT, SUGGEST RECOLLECTION (A)  Final   Report Status 03/02/2018 FINAL  Final  Blood Culture (routine x 2)     Status: None (Preliminary result)   Collection Time: 03/01/18 11:00 AM  Result Value Ref Range Status   Specimen Description BLOOD RIGHT ANTECUBITAL  Final   Special Requests   Final    BOTTLES DRAWN AEROBIC AND ANAEROBIC Blood Culture adequate volume   Culture   Final    NO GROWTH 4 DAYS Performed at [facility], [place] [address], [place], [place] [address]    Report Status PENDING  Incomplete  Blood Culture (routine x 2)     Status: None (Preliminary result)   Collection Time: 03/01/18  1:46 PM  Result Value Ref Range Status   Specimen Description   Final    BLOOD LEFT HAND Performed at [place] [address], [place], [place] [address]    Special Requests   Final    BOTTLES DRAWN AEROBIC AND ANAEROBIC Blood Culture adequate volume Performed at [place] [address] [place], [place] [address]    Culture   Final    NO GROWTH 4 DAYS Performed at [facility], [place] [address] [place], [place] [address]    Report Status PENDING  Incomplete  MRSA PCR Screening     Status: None   Collection Time: 03/01/18  4:58 PM  Result Value Ref Range Status   MRSA by PCR NEGATIVE NEGATIVE Final    Comment:        The GeneXpert MRSA Assay (FDA approved for NASAL specimens only), is one component of a comprehensive MRSA colonization surveillance program. It is not intended to diagnose MRSA infection nor to guide or monitor treatment for MRSA infections. Performed at  [facility], [place] [address], [place],  [address]      Time coordinating discharge: 40 minutes  SIGNED:   Charlynne Cousins, MD  [facility] 03/05/2018, 12:01 PM Pager   If 7PM-7AM, please contact night-coverage [email or web address]  Password [email or web address]

## 2018-03-05 NOTE — Progress Notes (Signed)
Pt bp 171/109, hr 100, NP notified. C.bodenheimer ordered 10 mg hydralazine q6 prn. RN administered hydralazine as ordered

## 2018-03-05 NOTE — Progress Notes (Signed)
PT Cancellation Note  Patient Details Name: [name] MRN: [id number] DOB: February 27, 1986   Cancelled Treatment:    Reason Eval/Treat Not Completed: Order received. Chart reviewed. Spoke with RN who stated pt was all set to d/c home. Will defer PT eval. Please call if eval is needed. Thanks.   Weston Anna, PT Acute Rehabilitation Services Pager: [phone] Office: [phone]

## 2018-03-06 LAB — CULTURE, BLOOD (ROUTINE X 2)
Culture: NO GROWTH
Culture: NO GROWTH
SPECIAL REQUESTS: ADEQUATE
Special Requests: ADEQUATE

## 2018-03-06 LAB — GLUCOSE, CAPILLARY: Glucose-Capillary: 80 mg/dL (ref 70–99)

## 2018-03-06 MED ORDER — INSULIN DEGLUDEC 100 UNIT/ML ~~LOC~~ SOPN: 60.0000 [IU] | PEN_INJECTOR | Freq: Two times a day (BID) | SUBCUTANEOUS | 3 refills | Status: DC

## 2018-03-06 MED ORDER — PREDNISONE 10 MG PO TABS: 10.0000 mg | ORAL_TABLET | ORAL | 3 refills | Status: DC

## 2018-03-06 NOTE — Progress Notes (Signed)
Dr. Aileen Fass returned page and said he would bring up signed doctor's note from the second floor.

## 2018-03-06 NOTE — Progress Notes (Signed)
Dr. Aileen Fass was notified about the patient's blood pressure of 165/111. He said it was fine and that she can still go home. Patient will be discharged.

## 2018-03-06 NOTE — Progress Notes (Signed)
[facility] PROGRESS NOTE    Progress Note  [name]  ZOX:[id number] DOB: 01-Mar-1986 DOA: 03/01/2018 PCP: [name], MD     Brief Narrative:   [name] is an 32 y.o. female past medical history significant for essential hypertension, diabetes mellitus type 2 uncontrolled, end-stage renal disease status post renal transplant, was also history of pseudoseizures who presents with left leg pain which started 2 days prior to admission progressively getting worse, she relates is been erythematous warm painful.  She denies any trauma  Assessment/Plan:   Sepsis secondary to left lower extremity nonpurulent cellulitis: Continue oral Augmentin as an outpatient.  Acute on chronic kidney disease stage IV status post transplant: She had nephrology's assistance continue torsemide at home.  Hypokalemia: We will repeat potassium more aggressively orally. Replete magnesium orally  Hypocalcemia: In the setting of chronic renal disease. She was given IV calcium gluconate, with mild improvement in her calcium. nephrology to continue to manage hypercalcemia.  Non-anion gap metabolic acidosis: She was started on a bicarbonate drip, with minimal improvement. Likely due to renal disease, further management per renal.  OBESITY  Anemia due to chronic kidney disease Hemoglobin appears to be at baseline. No signs of overt bleeding.  Uncontrolled diabetes mellitus type 2 with gastroparesis: With an A1c of 10.9. She was kept an extra day as her blood glucose continues to rise despite IV insulin greater than 400, the patient was not following a diet and once on steroids her insulin dose was doubled she will continue this at home.  Her blood glucose now is 250 and she is stable for discharge.  Gastroparesis DVT prophylaxis: lovenox Family Communication:none Disposition Plan/Barrier to D/C: Home today. Code Status:     Code Status Orders  (From admission, onward)        Start     Ordered   03/01/18 1658  Full code  Continuous     03/01/18 1658        Code Status History    Date Active Date Inactive Code Status Order ID Comments User Context   02/02/2018 2035 02/06/2018 1624 Full Code [id number]  Elgergawy, Silver Huguenin, MD Inpatient   11/11/2017 2325 11/14/2017 2024 Full Code [id number]  Vianne Bulls, MD Inpatient   10/24/2017 1829 10/26/2017 2131 Full Code [id number]  Oswald Hillock, MD ED   10/18/2017 0132 10/18/2017 1559 Full Code [id number]  Jani Gravel, MD ED   07/13/2017 1328 07/15/2017 1800 Full Code [id number]  Annita Brod, MD ED   07/03/2017 0058 07/05/2017 1601 Full Code [id number]  Toy Baker, MD ED   06/29/2017 2113 07/01/2017 1423 Full Code [id number]  Johnathan Hausen, MD Inpatient   12/11/2016 2212 12/18/2016 0036 Full Code [id number]  Vianne Bulls, MD ED   01/09/2015 1655 01/12/2015 2123 Full Code [id number]  Willia Craze, NP Inpatient   01/05/2015 1855 01/07/2015 1530 Full Code [id number]  Florencia Reasons, MD Inpatient   09/30/2014 1313 10/01/2014 2243 Full Code [id number]  Truett Mainland, DO Inpatient   06/24/2014 0031 06/25/2014 2337 Full Code [id number]  Rise Patience, MD Inpatient   12/22/2012 2251 12/23/2012 2223 Full Code [id number]  Toy Baker, MD Inpatient        IV Access:    Peripheral IV   Procedures and diagnostic studies:   No results found.   Medical Consultants:    None.  Anti-Infectives:   IV Zosyn  Subjective:    [name], talking to her sister the patient decided  to stay.  She feels much better today.  Objective:    Vitals:   03/05/18 0515 03/05/18 1721 03/05/18 2155 03/06/18 0500  BP: (!) 139/97 (!) 152/105 (!) 148/99 (!) 141/98  Pulse: 90 96 93 89  Resp: 17 18 17 17   Temp: 98 F (36.7 C)  98.1 F (36.7 C) 98.3 F (36.8 C)  TempSrc: Oral  Oral Rectal  SpO2: 99% 97% 98% 99%  Weight:    89.8 kg  Height:        Intake/Output Summary (Last 24 hours) at 03/06/2018 0728 Last data  filed at 03/05/2018 2255 Gross per 24 hour  Intake 646 ml  Output -  Net 646 ml   Filed Weights   03/03/18 0325 03/04/18 0521 03/06/18 0500  Weight: 90.3 kg 90.6 kg 89.8 kg    Exam: General exam: In no acute distress. Respiratory system: Good air movement and clear to auscultation. Cardiovascular system: S1 & S2 heard, RRR.  neagtive JVD Gastrointestinal system: Abdomen is nondistended, soft and nontender.  Central nervous system: Alert and oriented. No focal neurological deficits. Extremities: Trace edema Skin: Erythema has improved. Psychiatry: Judgement and insight appear normal. Mood & affect appropriate.    Data Reviewed:    Labs: Basic Metabolic Panel: Recent Labs  Lab 03/01/18 1100 03/01/18 1717 03/02/18 0321 03/03/18 0843 03/04/18 0349  NA 138 139 138 137 137  K 3.0* 3.6 3.6 3.3* 3.3*  CL 113* 115* 111 107 105  CO2 13* 12* 13* 14* 17*  GLUCOSE 230* 172* 109* 180* 246*  BUN 108* 104* 95* 107* 108*  CREATININE 5.66* 5.28* 5.45* 6.27* 6.78*  CALCIUM 4.6* 4.3* 5.2* 6.5* 6.8*  MG  --  0.9* 1.6*  --   --   PHOS  --  5.2* 5.9*  --   --    GFR Estimated Creatinine Clearance: 13.2 mL/min (A) (by C-G formula based on SCr of 6.78 mg/dL (H)). Liver Function Tests: Recent Labs  Lab 03/01/18 1100 03/01/18 1717 03/02/18 0321  AST 17 15 15   ALT 25 21 20   ALKPHOS 39 32* 31*  BILITOT 0.7 0.4 0.6  PROT 6.2* 5.4* 5.7*  ALBUMIN 3.0* 2.4* 2.5*   No results for input(s): LIPASE, AMYLASE in the last 168 hours. No results for input(s): AMMONIA in the last 168 hours. Coagulation profile No results for input(s): INR, PROTIME in the last 168 hours.  CBC: Recent Labs  Lab 03/01/18 1100 03/01/18 1717 03/02/18 0321 03/03/18 0843  WBC 15.1* 14.4* 13.0* 10.8*  NEUTROABS 11.5* 12.3*  --  7.7  HGB 9.4* 8.2* 8.8* 8.0*  HCT 30.4* 27.2* 29.1* 26.2*  MCV 94.4 94.8 96.7 96.3  PLT 162 146* 123* 123*   Cardiac Enzymes: No results for input(s): CKTOTAL, CKMB, CKMBINDEX,  TROPONINI in the last 168 hours. BNP (last 3 results) No results for input(s): PROBNP in the last 8760 hours. CBG: Recent Labs  Lab 03/04/18 2055 03/05/18 0733 03/05/18 1225 03/05/18 1722 03/05/18 2156  GLUCAP 388* 350* 405* 393* 259*   D-Dimer: No results for input(s): DDIMER in the last 72 hours. Hgb A1c: No results for input(s): HGBA1C in the last 72 hours. Lipid Profile: No results for input(s): CHOL, HDL, LDLCALC, TRIG, CHOLHDL, LDLDIRECT in the last 72 hours. Thyroid function studies: No results for input(s): TSH, T4TOTAL, T3FREE, THYROIDAB in the last 72 hours.  Invalid input(s): FREET3 Anemia work up: Recent Labs    03/03/18 0843  TIBC 126*  IRON 65   Sepsis Labs: Recent Labs  Lab 03/01/18 1100 03/01/18 1106 03/01/18 1402 03/01/18 1717 03/02/18 0321 03/03/18 0250 03/03/18 0843  PROCALCITON  --   --   --  4.31 5.33 5.06  --   WBC 15.1*  --   --  14.4* 13.0*  --  10.8*  LATICACIDVEN  --  0.99 0.74  --   --   --   --    Microbiology Recent Results (from the past 240 hour(s))  Urine culture     Status: Abnormal   Collection Time: 03/01/18 10:21 AM  Result Value Ref Range Status   Specimen Description   Final    URINE, CLEAN CATCH Performed at [facility], [place] [address] [place], [place] [address]    Special Requests   Final    Normal Performed at [facility], [place] [address] [place], [place] [address]    Culture MULTIPLE SPECIES PRESENT, SUGGEST RECOLLECTION (A)  Final   Report Status 03/02/2018 FINAL  Final  Blood Culture (routine x 2)     Status: None (Preliminary result)   Collection Time: 03/01/18 11:00 AM  Result Value Ref Range Status   Specimen Description BLOOD RIGHT ANTECUBITAL  Final   Special Requests   Final    BOTTLES DRAWN AEROBIC AND ANAEROBIC Blood Culture adequate volume   Culture   Final    NO GROWTH 4 DAYS Performed at [facility], [place] [address] [place], [place] [address]     Report Status PENDING  Incomplete  Blood Culture (routine x 2)     Status: None (Preliminary result)   Collection Time: 03/01/18  1:46 PM  Result Value Ref Range Status   Specimen Description   Final    BLOOD LEFT HAND Performed at [place] [address] [place], [place] [address]    Special Requests   Final    BOTTLES DRAWN AEROBIC AND ANAEROBIC Blood Culture adequate volume Performed at [place] [address] [place], [place] [address]    Culture   Final    NO GROWTH 4 DAYS Performed at [facility], [place] [address] [place], [place] [address]    Report Status PENDING  Incomplete  MRSA PCR Screening     Status: None   Collection Time: 03/01/18  4:58 PM  Result Value Ref Range Status   MRSA by PCR NEGATIVE NEGATIVE Final    Comment:        The GeneXpert MRSA Assay (FDA approved for NASAL specimens only), is one component of a comprehensive MRSA colonization surveillance program. It is not intended to diagnose MRSA infection nor to guide or monitor treatment for MRSA infections. Performed at [facility], [place] [address], [place], [place] [address]      Medications:   . amoxicillin-clavulanate  500 mg Oral Q12H  . calcitRIOL  0.5 mcg Oral Daily  . calcium acetate  1,334 mg Oral TID WC  . calcium-vitamin D  1 tablet Oral TID PC  . dicyclomine  20 mg Oral BID  . famotidine  10 mg Oral Daily  . fluticasone  2 spray Each Nare Daily  . gabapentin  200 mg Oral TID  . heparin  5,000 Units Subcutaneous Q8H  . insulin aspart  0-20 Units Subcutaneous TID WC  . insulin aspart  0-5 Units Subcutaneous QHS  . insulin aspart  6 Units Subcutaneous TID WC  . insulin glargine  60 Units Subcutaneous BID  . metoCLOPramide  5 mg Oral TID AC  . mycophenolate  720 mg Oral BID  . predniSONE  10 mg Oral BH-q7a  . simvastatin  20 mg Oral QHS  . sodium bicarbonate  650 mg Oral BID  . sodium chloride flush  3 mL  Intravenous Q12H  . tacrolimus  3 mg Oral BID   Continuous Infusions: . sodium chloride Stopped (03/03/18 2323)     LOS: 5 days   Charlynne Cousins  [facility]   *Please refer to [email or web address], password [email or web address] to get updated schedule on who will round on this patient, as hospitalists switch teams weekly. If 7PM-7AM, please contact night-coverage at [email or web address], password [email or web address] for any overnight needs.  03/06/2018, 7:28 AM

## 2018-03-06 NOTE — Progress Notes (Signed)
Pt has received discharge instructions and had no immediate questions or concerns. Pt wanted a printed doctor's note with a signature. Doctor has been notified. Pt has decided to be pushed downstairs in a wheelchair for discharge.   Valli Glance, RN

## 2018-03-06 NOTE — Discharge Instructions (Signed)
[name] was admitted to the Hospital on 03/01/2018 and Discharged on Discharge Date 03/05/2018 and should be excused from work/school   for 6  days starting 03/01/2018 , may return to work/school without any restrictions.  Call Bess Harvest MD, [facility] [phone] with questions.  Charlynne Cousins M.D on 03/05/2018,at 11:54 AM  [facility] Office  [phone]

## 2018-03-18 ENCOUNTER — Inpatient Hospital Stay (HOSPITAL_COMMUNITY)
Admission: EM | Admit: 2018-03-18 | Discharge: 2018-03-24 | DRG: 698 | Disposition: A | Discharge status: Home / Self Care | Payer: Medicare Other | Attending: Internal Medicine | Admitting: Internal Medicine

## 2018-03-18 ENCOUNTER — Encounter (HOSPITAL_COMMUNITY): Payer: Self-pay | Admitting: Emergency Medicine

## 2018-03-18 ENCOUNTER — Emergency Department (HOSPITAL_COMMUNITY): Payer: Medicare Other

## 2018-03-18 DIAGNOSIS — N179 Acute kidney failure, unspecified: Secondary | ICD-10-CM | POA: Diagnosis present

## 2018-03-18 DIAGNOSIS — Z940 Kidney transplant status: Secondary | ICD-10-CM | POA: Diagnosis not present

## 2018-03-18 DIAGNOSIS — Z6829 Body mass index (BMI) 29.0-29.9, adult: Secondary | ICD-10-CM

## 2018-03-18 DIAGNOSIS — N2581 Secondary hyperparathyroidism of renal origin: Secondary | ICD-10-CM | POA: Diagnosis present

## 2018-03-18 DIAGNOSIS — I4581 Long QT syndrome: Secondary | ICD-10-CM | POA: Diagnosis present

## 2018-03-18 DIAGNOSIS — Z9049 Acquired absence of other specified parts of digestive tract: Secondary | ICD-10-CM | POA: Diagnosis not present

## 2018-03-18 DIAGNOSIS — Z833 Family history of diabetes mellitus: Secondary | ICD-10-CM

## 2018-03-18 DIAGNOSIS — Z881 Allergy status to other antibiotic agents status: Secondary | ICD-10-CM

## 2018-03-18 DIAGNOSIS — E669 Obesity, unspecified: Secondary | ICD-10-CM | POA: Diagnosis present

## 2018-03-18 DIAGNOSIS — E785 Hyperlipidemia, unspecified: Secondary | ICD-10-CM | POA: Diagnosis present

## 2018-03-18 DIAGNOSIS — K3184 Gastroparesis: Secondary | ICD-10-CM | POA: Diagnosis present

## 2018-03-18 DIAGNOSIS — Z794 Long term (current) use of insulin: Secondary | ICD-10-CM | POA: Diagnosis not present

## 2018-03-18 DIAGNOSIS — N189 Chronic kidney disease, unspecified: Secondary | ICD-10-CM | POA: Diagnosis present

## 2018-03-18 DIAGNOSIS — E1022 Type 1 diabetes mellitus with diabetic chronic kidney disease: Secondary | ICD-10-CM | POA: Diagnosis present

## 2018-03-18 DIAGNOSIS — I129 Hypertensive chronic kidney disease with stage 1 through stage 4 chronic kidney disease, or unspecified chronic kidney disease: Secondary | ICD-10-CM | POA: Diagnosis present

## 2018-03-18 DIAGNOSIS — E1065 Type 1 diabetes mellitus with hyperglycemia: Secondary | ICD-10-CM | POA: Diagnosis present

## 2018-03-18 DIAGNOSIS — Z8249 Family history of ischemic heart disease and other diseases of the circulatory system: Secondary | ICD-10-CM

## 2018-03-18 DIAGNOSIS — N185 Chronic kidney disease, stage 5: Secondary | ICD-10-CM | POA: Diagnosis not present

## 2018-03-18 DIAGNOSIS — R600 Localized edema: Secondary | ICD-10-CM | POA: Diagnosis not present

## 2018-03-18 DIAGNOSIS — F445 Conversion disorder with seizures or convulsions: Secondary | ICD-10-CM | POA: Diagnosis present

## 2018-03-18 DIAGNOSIS — Y830 Surgical operation with transplant of whole organ as the cause of abnormal reaction of the patient, or of later complication, without mention of misadventure at the time of the procedure: Secondary | ICD-10-CM | POA: Diagnosis present

## 2018-03-18 DIAGNOSIS — Z8261 Family history of arthritis: Secondary | ICD-10-CM

## 2018-03-18 DIAGNOSIS — T8612 Kidney transplant failure: Secondary | ICD-10-CM | POA: Diagnosis present

## 2018-03-18 DIAGNOSIS — Z7951 Long term (current) use of inhaled steroids: Secondary | ICD-10-CM

## 2018-03-18 DIAGNOSIS — N184 Chronic kidney disease, stage 4 (severe): Secondary | ICD-10-CM | POA: Diagnosis not present

## 2018-03-18 DIAGNOSIS — Z9089 Acquired absence of other organs: Secondary | ICD-10-CM | POA: Diagnosis not present

## 2018-03-18 DIAGNOSIS — Z7952 Long term (current) use of systemic steroids: Secondary | ICD-10-CM

## 2018-03-18 DIAGNOSIS — Z87440 Personal history of urinary (tract) infections: Secondary | ICD-10-CM | POA: Diagnosis not present

## 2018-03-18 DIAGNOSIS — Q970 Karyotype 47, XXX: Secondary | ICD-10-CM

## 2018-03-18 DIAGNOSIS — G894 Chronic pain syndrome: Secondary | ICD-10-CM | POA: Diagnosis present

## 2018-03-18 DIAGNOSIS — E8770 Fluid overload, unspecified: Secondary | ICD-10-CM

## 2018-03-18 DIAGNOSIS — E109 Type 1 diabetes mellitus without complications: Secondary | ICD-10-CM | POA: Diagnosis present

## 2018-03-18 DIAGNOSIS — Z888 Allergy status to other drugs, medicaments and biological substances status: Secondary | ICD-10-CM

## 2018-03-18 DIAGNOSIS — E1043 Type 1 diabetes mellitus with diabetic autonomic (poly)neuropathy: Secondary | ICD-10-CM | POA: Diagnosis present

## 2018-03-18 DIAGNOSIS — Z91013 Allergy to seafood: Secondary | ICD-10-CM

## 2018-03-18 DIAGNOSIS — F8189 Other developmental disorders of scholastic skills: Secondary | ICD-10-CM | POA: Diagnosis present

## 2018-03-18 DIAGNOSIS — IMO0002 Reserved for concepts with insufficient information to code with codable children: Secondary | ICD-10-CM

## 2018-03-18 DIAGNOSIS — N186 End stage renal disease: Secondary | ICD-10-CM | POA: Diagnosis present

## 2018-03-18 DIAGNOSIS — I10 Essential (primary) hypertension: Secondary | ICD-10-CM | POA: Diagnosis not present

## 2018-03-18 DIAGNOSIS — D631 Anemia in chronic kidney disease: Secondary | ICD-10-CM | POA: Diagnosis present

## 2018-03-18 DIAGNOSIS — Z91018 Allergy to other foods: Secondary | ICD-10-CM

## 2018-03-18 DIAGNOSIS — E108 Type 1 diabetes mellitus with unspecified complications: Secondary | ICD-10-CM

## 2018-03-18 DIAGNOSIS — N178 Other acute kidney failure: Secondary | ICD-10-CM | POA: Diagnosis not present

## 2018-03-18 DIAGNOSIS — N19 Unspecified kidney failure: Secondary | ICD-10-CM | POA: Diagnosis present

## 2018-03-18 DIAGNOSIS — Z886 Allergy status to analgesic agent status: Secondary | ICD-10-CM

## 2018-03-18 DIAGNOSIS — Z79899 Other long term (current) drug therapy: Secondary | ICD-10-CM

## 2018-03-18 DIAGNOSIS — E1042 Type 1 diabetes mellitus with diabetic polyneuropathy: Secondary | ICD-10-CM | POA: Diagnosis present

## 2018-03-18 DIAGNOSIS — E1143 Type 2 diabetes mellitus with diabetic autonomic (poly)neuropathy: Secondary | ICD-10-CM | POA: Diagnosis present

## 2018-03-18 LAB — BASIC METABOLIC PANEL
Anion gap: 10 (ref 5–15)
BUN: 97 mg/dL — ABNORMAL HIGH (ref 6–20)
CO2: 16 mmol/L — ABNORMAL LOW (ref 22–32)
Calcium: 5.1 mg/dL — CL (ref 8.9–10.3)
Chloride: 113 mmol/L — ABNORMAL HIGH (ref 98–111)
Creatinine, Ser: 7.04 mg/dL — ABNORMAL HIGH (ref 0.44–1.00)
GFR calc Af Amer: 8 mL/min — ABNORMAL LOW (ref >60)
GFR calc non Af Amer: 7 mL/min — ABNORMAL LOW (ref >60)
Glucose, Bld: 184 mg/dL — ABNORMAL HIGH (ref 70–99)
Potassium: 4.5 mmol/L (ref 3.5–5.1)
Sodium: 139 mmol/L (ref 135–145)

## 2018-03-18 LAB — CBC WITH DIFFERENTIAL/PLATELET
ABS IMMATURE GRANULOCYTES: 0.05 10*3/uL (ref 0.00–0.07)
BASOS PCT: 1 %
Basophils Absolute: 0.1 10*3/uL (ref 0.0–0.1)
EOS ABS: 0.7 10*3/uL — AB (ref 0.0–0.5)
Eosinophils Relative: 7 %
HCT: 24.2 % — ABNORMAL LOW (ref 36.0–46.0)
Hemoglobin: 7.1 g/dL — ABNORMAL LOW (ref 12.0–15.0)
Immature Granulocytes: 1 %
LYMPHS PCT: 25 %
Lymphs Abs: 2.5 10*3/uL (ref 0.7–4.0)
MCH: 28.3 pg (ref 26.0–34.0)
MCHC: 29.3 g/dL — ABNORMAL LOW (ref 30.0–36.0)
MCV: 96.4 fL (ref 80.0–100.0)
Monocytes Absolute: 0.8 10*3/uL (ref 0.1–1.0)
Monocytes Relative: 8 %
Neutro Abs: 6 10*3/uL (ref 1.7–7.7)
Neutrophils Relative %: 58 %
PLATELETS: 187 10*3/uL (ref 150–400)
RBC: 2.51 MIL/uL — ABNORMAL LOW (ref 3.87–5.11)
RDW: 13.6 % (ref 11.5–15.5)
WBC: 10 10*3/uL (ref 4.0–10.5)
nRBC: 0 % (ref 0.0–0.2)

## 2018-03-18 MED ORDER — INSULIN ASPART 100 UNIT/ML ~~LOC~~ SOLN
0.0000 [IU] | Freq: Every day | SUBCUTANEOUS | Status: DC
  Administered 2018-03-19 (×2): 3 [IU] via SUBCUTANEOUS
  Administered 2018-03-20: 2 [IU] via SUBCUTANEOUS
  Administered 2018-03-21 – 2018-03-22 (×2): 4 [IU] via SUBCUTANEOUS

## 2018-03-18 MED ORDER — CARVEDILOL 12.5 MG PO TABS
12.5000 mg | ORAL_TABLET | Freq: Two times a day (BID) | ORAL | Status: DC
  Administered 2018-03-18 – 2018-03-19 (×2): 12.5 mg via ORAL
  Filled 2018-03-18 (×2): qty 1

## 2018-03-18 MED ORDER — MYCOPHENOLATE SODIUM 180 MG PO TBEC
720.0000 mg | DELAYED_RELEASE_TABLET | Freq: Two times a day (BID) | ORAL | Status: DC
  Administered 2018-03-19 – 2018-03-24 (×12): 720 mg via ORAL
  Filled 2018-03-18 (×14): qty 4

## 2018-03-18 MED ORDER — TORSEMIDE 20 MG PO TABS
80.0000 mg | ORAL_TABLET | Freq: Two times a day (BID) | ORAL | Status: DC
  Administered 2018-03-18: 80 mg via ORAL
  Filled 2018-03-18: qty 4

## 2018-03-18 MED ORDER — MORPHINE SULFATE (PF) 2 MG/ML IV SOLN
2.0000 mg | Freq: Once | INTRAVENOUS | Status: AC
  Administered 2018-03-18: 2 mg via INTRAVENOUS
  Filled 2018-03-18: qty 1

## 2018-03-18 MED ORDER — CALCIUM ACETATE (PHOS BINDER) 667 MG PO CAPS
1334.0000 mg | ORAL_CAPSULE | Freq: Three times a day (TID) | ORAL | Status: DC
  Administered 2018-03-18 – 2018-03-21 (×9): 1334 mg via ORAL
  Filled 2018-03-18 (×11): qty 2

## 2018-03-18 MED ORDER — PREDNISONE 10 MG PO TABS
10.0000 mg | ORAL_TABLET | Freq: Every day | ORAL | Status: DC
  Administered 2018-03-19 – 2018-03-24 (×6): 10 mg via ORAL
  Filled 2018-03-18 (×6): qty 1

## 2018-03-18 MED ORDER — INSULIN GLARGINE 100 UNIT/ML ~~LOC~~ SOLN
25.0000 [IU] | Freq: Every day | SUBCUTANEOUS | Status: DC
  Administered 2018-03-19 – 2018-03-20 (×3): 25 [IU] via SUBCUTANEOUS
  Filled 2018-03-18 (×3): qty 0.25

## 2018-03-18 MED ORDER — SIMVASTATIN 20 MG PO TABS
20.0000 mg | ORAL_TABLET | Freq: Every day | ORAL | Status: DC
  Administered 2018-03-19 – 2018-03-23 (×6): 20 mg via ORAL
  Filled 2018-03-18 (×6): qty 1

## 2018-03-18 MED ORDER — FAMOTIDINE 20 MG PO TABS
10.0000 mg | ORAL_TABLET | Freq: Every day | ORAL | Status: DC
  Administered 2018-03-19 – 2018-03-24 (×6): 10 mg via ORAL
  Filled 2018-03-18 (×6): qty 1

## 2018-03-18 MED ORDER — METOLAZONE 5 MG PO TABS
5.0000 mg | ORAL_TABLET | Freq: Every day | ORAL | Status: DC
  Administered 2018-03-19 – 2018-03-20 (×2): 5 mg via ORAL
  Filled 2018-03-18 (×4): qty 1

## 2018-03-18 MED ORDER — SODIUM BICARBONATE 650 MG PO TABS
650.0000 mg | ORAL_TABLET | Freq: Three times a day (TID) | ORAL | Status: DC
  Administered 2018-03-19 – 2018-03-20 (×7): 650 mg via ORAL
  Filled 2018-03-18 (×8): qty 1

## 2018-03-18 MED ORDER — METOCLOPRAMIDE HCL 10 MG PO TABS
10.0000 mg | ORAL_TABLET | Freq: Four times a day (QID) | ORAL | Status: DC | PRN
  Administered 2018-03-21: 10 mg via ORAL
  Filled 2018-03-18: qty 1

## 2018-03-18 MED ORDER — GABAPENTIN 300 MG PO CAPS: 300.0000 mg | ORAL_CAPSULE | Freq: Three times a day (TID) | ORAL | Status: DC

## 2018-03-18 MED ORDER — FUROSEMIDE 10 MG/ML IJ SOLN
80.0000 mg | Freq: Two times a day (BID) | INTRAMUSCULAR | Status: DC
  Administered 2018-03-18 – 2018-03-24 (×11): 80 mg via INTRAVENOUS
  Filled 2018-03-18 (×12): qty 8

## 2018-03-18 MED ORDER — INSULIN ASPART 100 UNIT/ML ~~LOC~~ SOLN
0.0000 [IU] | Freq: Three times a day (TID) | SUBCUTANEOUS | Status: DC
  Administered 2018-03-19: 8 [IU] via SUBCUTANEOUS
  Administered 2018-03-19 (×2): 3 [IU] via SUBCUTANEOUS
  Administered 2018-03-20 (×2): 5 [IU] via SUBCUTANEOUS
  Administered 2018-03-21: 15 [IU] via SUBCUTANEOUS
  Administered 2018-03-21 – 2018-03-22 (×2): 8 [IU] via SUBCUTANEOUS
  Administered 2018-03-22: 3 [IU] via SUBCUTANEOUS
  Administered 2018-03-23 – 2018-03-24 (×3): 2 [IU] via SUBCUTANEOUS
  Administered 2018-03-24: 3 [IU] via SUBCUTANEOUS

## 2018-03-18 MED ORDER — METOCLOPRAMIDE HCL 10 MG PO TABS: 10.0000 mg | ORAL_TABLET | Freq: Three times a day (TID) | ORAL | Status: DC

## 2018-03-18 MED ORDER — GABAPENTIN 400 MG PO CAPS
400.0000 mg | ORAL_CAPSULE | Freq: Three times a day (TID) | ORAL | Status: DC
  Administered 2018-03-19 – 2018-03-21 (×10): 400 mg via ORAL
  Filled 2018-03-18 (×11): qty 1

## 2018-03-18 MED ORDER — TACROLIMUS 1 MG PO CAPS
3.0000 mg | ORAL_CAPSULE | Freq: Two times a day (BID) | ORAL | Status: DC
  Administered 2018-03-19 – 2018-03-24 (×12): 3 mg via ORAL
  Filled 2018-03-18 (×12): qty 3

## 2018-03-18 MED ORDER — CALCITRIOL 0.25 MCG PO CAPS
1.0000 ug | ORAL_CAPSULE | Freq: Every day | ORAL | Status: DC
  Administered 2018-03-19 – 2018-03-24 (×6): 1 ug via ORAL
  Filled 2018-03-18 (×2): qty 4
  Filled 2018-03-18: qty 2
  Filled 2018-03-18: qty 4
  Filled 2018-03-18: qty 2
  Filled 2018-03-18 (×2): qty 4

## 2018-03-18 MED ORDER — CALCITRIOL 0.25 MCG PO CAPS: 0.2500 ug | ORAL_CAPSULE | Freq: Every day | ORAL | Status: DC

## 2018-03-18 MED ORDER — HEPARIN SODIUM (PORCINE) 5000 UNIT/ML IJ SOLN
5000.0000 [IU] | Freq: Three times a day (TID) | INTRAMUSCULAR | Status: DC
  Administered 2018-03-19 (×2): 5000 [IU] via SUBCUTANEOUS
  Filled 2018-03-18 (×2): qty 1

## 2018-03-18 MED ORDER — CALCIUM GLUCONATE-NACL 1-0.675 GM/50ML-% IV SOLN
1.0000 g | Freq: Once | INTRAVENOUS | Status: DC
  Filled 2018-03-18: qty 50

## 2018-03-18 NOTE — ED Provider Notes (Signed)
Complains of bilateral leg pain and swelling for the past 1 month, unchanged she presents today she developed shortness of breath onset 2 days ago..  She denies any chest pain denies fever denies cough.  On exam she is alert no respiratory distress.  Lungs with scant crackles posteriorly bilaterally.  Abdomen soft nontender heart regular rate and rhythm bilateral lower extremities are reddened and tender at the shins and at the calves.  DP pulse 2+ bilaterally.  Patient with chronic renal failure which has worsened over the past 2 weeks.  Also noted to be hypocalcemic.  Likely cause of dyspnea is mild congestive heart failure, less likely pulmonary embolism. Test x-ray viewed by me   Orlie Dakin, MD 03/18/18 1435

## 2018-03-18 NOTE — ED Notes (Signed)
ORDERED DIET TRAY FOR PT  

## 2018-03-18 NOTE — H&P (Addendum)
History and Physical    [name] MEQ:[id number] DOB: [birth year] DOA: 03/18/2018  PCP: [name], MD   Patient coming from: Home    Chief Complaint: Pain and swelling of bilateral lower extremities, shortness of breath  HPI: [name] is a 33 y.o. 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  Patient has been admitted here numerous times in the past.  Her last admission was in December when she presented with similar complaints.  She was diuresed with IV Lasix and was discharged to home.  She was anticipated for starting on dialysis near future but not requiring urgent dialysis at that time.  She states she has been taking her medication at home. She lives by herself and works as a Scientist, water quality.  Denies smoking or alcohol intake. Patient seen and examined the bedside in the emergency department.  Currently she is hemodynamically stable.  Complains of severe pain and discomfort in her bilateral lower extremities.  Says she developed shortness of breath about 2 days ago.  Denies any chest pain, fever, chills, abdominal pain, dysuria, nausea, vomiting, diarrhea or headache.  ED Course: Nephrology consulted.    Past Medical History:  Diagnosis Date  . Chronic kidney disease   . Diabetes mellitus    Pt reports diagnosis in June 2011  . Hyperlipidemia   . Hypertension   . Kidney transplant recipient   . 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 Pyelonephritis 06/23/2014  . UTI (urinary tract infection) 01/09/2015  . 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   Qualifier: Diagnosis of  By: Carlena Sax  MD, Colletta Maryland        Past Surgical History:  Procedure Laterality Date  . ARTERIOVENOUS GRAFT PLACEMENT Bilateral    "neither work" (10/24/2017)  . CHOLECYSTECTOMY N/A 06/30/2017   Procedure: LAPAROSCOPIC CHOLECYSTECTOMY WITH INTRAOPERATIVE CHOLANGIOGRAM;  Surgeon: Excell Seltzer, MD;  Location: [facility];  Service: General;  Laterality: N/A;  . ESOPHAGOGASTRODUODENOSCOPY (EGD) WITH PROPOFOL N/A 07/04/2017   Procedure: ESOPHAGOGASTRODUODENOSCOPY (EGD) WITH PROPOFOL;  Surgeon: Clarene Essex, MD;  Location: [facility];  Service: Endoscopy;  Laterality: N/A;  . KIDNEY TRANSPLANT  2007  . PARATHYROIDECTOMY  ?2012   "3/4 removed" (10/24/2017)  . RENAL BIOPSY Bilateral 2003     reports that she has never smoked. She has never used smokeless tobacco. She reports that she does not drink alcohol or use drugs.  Allergies  Allergen Reactions  . Benadryl [Diphenhydramine-Zinc Acetate] Shortness Of Breath  . Motrin [Ibuprofen] Shortness Of Breath and Itching    Per pt  . Banana Other (See Comments)    Sick on the stomach  . Diphenhydramine Hcl     REACTION: Stopped breathing in ICU  . Doxycycline     Shortness of Breath   . Iron Dextran     REACTION: vein irritation  . Shellfish Allergy Hives    Family History  Problem Relation Age of Onset  . Arthritis Mother   . Diabetes Mother   . Hypertension Mother      Prior to Admission medications   Medication Sig Start Date End Date Taking? Authorizing Provider  ACCU-CHEK SOFTCLIX LANCETS lancets Use to check blood sugar 4 times  per day. 12/29/15   Renato Shin, MD  acetaminophen (TYLENOL) 500 MG tablet Take 1,000 mg by mouth daily as needed for moderate pain.     [provider]  amoxicillin-clavulanate (AUGMENTIN) 500-125 MG tablet Take 1 tablet (500 mg total) by mouth every 12 (twelve) hours. 03/05/18   Charlynne Cousins, MD  calcitRIOL (ROCALTROL) 0.25 MCG capsule Take 0.25 mcg by mouth daily.    [provider]  calcium acetate  (PHOSLO) 667 MG capsule Take 1,334 mg by mouth 3 (three) times daily with meals.    [provider]  Calcium Carb-Cholecalciferol (CALCIUM 1000 + D PO) Take 1 tablet by mouth daily.     [provider]  calcium elemental as carbonate (BARIATRIC TUMS ULTRA) 400 MG chewable tablet Chew 1,000 mg by mouth 3 (three) times daily as needed for heartburn.     [provider]  carvedilol (COREG) 12.5 MG tablet Take 12.5 mg by mouth 2 (two) times daily with a meal.    [provider]  dicyclomine (BENTYL) 20 MG tablet Take 1 tablet (20 mg total) by mouth 2 (two) times daily. 02/06/18   [name], [name], MD  famotidine (PEPCID) 10 MG tablet Take 10 mg by mouth daily.    [provider]  fluticasone (FLONASE) 50 MCG/ACT nasal spray Place 2 sprays into both nostrils daily. Patient taking differently: Place 2 sprays into both nostrils daily as needed for allergies.  07/20/15   Muthersbaugh, Jarrett Soho, PA-C  gabapentin (NEURONTIN) 300 MG capsule Take 300 mg by mouth 3 (three) times daily.    [provider]  hydrOXYzine (ATARAX/VISTARIL) 50 MG tablet Take 1 tablet (50 mg total) by mouth 2 (two) times daily as needed for itching. 02/06/18   [name], Ripudeep K, MD  insulin aspart (NOVOLOG FLEXPEN) 100 UNIT/ML FlexPen Inject 12-15 Units into the skin See admin instructions. 5-6 times per day as needed    [provider]  insulin degludec (TRESIBA) 100 UNIT/ML SOPN FlexTouch Pen Inject 0.6 mLs (60 Units total) into the skin 2 (two) times daily. Reports taking 24 units QAM 03/06/18   Charlynne Cousins, MD  loperamide (IMODIUM) 2 MG capsule Take 2 mg by mouth as needed for diarrhea or loose stools.     [provider]  metoCLOPramide (REGLAN) 10 MG tablet Take 1 tablet (10 mg total) by mouth 3 (three) times daily before meals. 02/06/18   [name], [name], MD  mycophenolate (MYFORTIC) 360 MG TBEC Take 720 mg by mouth 2 (two) times daily.     [provider]  ondansetron (ZOFRAN ODT) 4 MG disintegrating tablet Take 1 tablet (4 mg total) by mouth every 4 (four) hours as needed for nausea or vomiting. 10/26/17   Roxan Hockey, MD  predniSONE (DELTASONE) 10 MG tablet Take 1 tablet (10 mg total) by mouth every morning. 03/06/18   Charlynne Cousins, MD  promethazine (PHENERGAN) 25 MG tablet Take 1 tablet (25 mg total) by mouth every 6 (six) hours as needed for nausea or vomiting. 02/06/18   [name], [name], MD  simvastatin (ZOCOR) 20 MG tablet Take 20 mg by mouth at bedtime.     [provider]  sodium bicarbonate 650 MG tablet Take 650 mg by mouth 3 (three) times daily.    [provider]  tacrolimus (PROGRAF) 1 MG capsule Take 3 mg by mouth 2 (two) times daily.     [provider]  torsemide (DEMADEX) 20 MG tablet Take 4  tablets (80 mg total) by mouth 2 (two) times daily. 03/05/18   Charlynne Cousins, MD    Physical Exam: Vitals:   03/18/18 1228 03/18/18 1229 03/18/18 1300 03/18/18 1315  BP:  (!) 133/93 (!) 131/99 (!) 120/94  Pulse:  (!) 111 (!) 108 (!) 109  Resp:  15 17 17   Temp:  98.7 F (37.1 C)    TempSrc:  Oral    SpO2:  98% 98% 99%  Weight: 86.6 kg     Height: 5\' 6"  (1.676 m)       Constitutional: Uncomfortable due to lower extremity pain, edema Vitals:   03/18/18 1228 03/18/18 1229 03/18/18 1300 03/18/18 1315  BP:  (!) 133/93 (!) 131/99 (!) 120/94  Pulse:  (!) 111 (!) 108 (!) 109  Resp:  15 17 17   Temp:  98.7 F (37.1 C)    TempSrc:  Oral    SpO2:  98% 98% 99%  Weight: 86.6 kg     Height: 5\' 6"  (1.676 m)      Eyes: PERRL, lids and conjunctivae normal ENMT: Mucous membranes are moist. Posterior pharynx clear of any exudate or lesions.Normal dentition.  Neck: normal, supple, no masses, no thyromegaly Respiratory: Bilateral trace basal crackles Cardiovascular: Regular rate and rhythm, no murmurs / rubs / gallops. 1-2+ lower extremity edema, anasarca. no carotid bruits.  Abdomen:  no tenderness, no masses palpated. No hepatosplenomegaly. Bowel sounds positive.  Musculoskeletal: no clubbing / cyanosis. No joint deformity upper and lower extremities. Good ROM, no contractures. Normal muscle tone.  Previous AV graft on bilateral upper extremities Skin: no rashes, lesions, ulcers. No induration. Scars from previous wounds on the bilateral lower extremities Neurologic: CN 2-12 grossly intact. Sensation intact, DTR normal. Strength 5/5 in all 4.  Psychiatric: Normal judgment and insight. Alert and oriented x 3. Normal mood.   Foley Catheter:None  Labs on Admission: I have personally reviewed following labs and imaging studies  CBC: Recent Labs  Lab 03/18/18 1313  WBC 10.0  NEUTROABS 6.0  HGB 7.1*  HCT 24.2*  MCV 96.4  PLT 440   Basic Metabolic Panel: Recent Labs  Lab 03/18/18 1313  NA 139  K 4.5  CL 113*  CO2 16*  GLUCOSE 184*  BUN 97*  CREATININE 7.04*  CALCIUM 5.1*   GFR: Estimated Creatinine Clearance: 12.5 mL/min (A) (by C-G formula based on SCr of 7.04 mg/dL (H)). Liver Function Tests: No results for input(s): AST, ALT, ALKPHOS, BILITOT, PROT, ALBUMIN in the last 168 hours. No results for input(s): LIPASE, AMYLASE in the last 168 hours. No results for input(s): AMMONIA in the last 168 hours. Coagulation Profile: No results for input(s): INR, PROTIME in the last 168 hours. Cardiac Enzymes: No results for input(s): CKTOTAL, CKMB, CKMBINDEX, TROPONINI in the last 168 hours. BNP (last 3 results) No results for input(s): PROBNP in the last 8760 hours. HbA1C: No results for input(s): HGBA1C in the last 72 hours. CBG: No results for input(s): GLUCAP in the last 168 hours. Lipid Profile: No results for input(s): CHOL, HDL, LDLCALC, TRIG, CHOLHDL, LDLDIRECT in the last 72 hours. Thyroid Function Tests: No results for input(s): TSH, T4TOTAL, FREET4, T3FREE, THYROIDAB in the last 72 hours. Anemia Panel: No results for input(s): VITAMINB12, FOLATE,  FERRITIN, TIBC, IRON, RETICCTPCT in the last 72 hours. Urine analysis:    Component Value Date/Time   COLORURINE STRAW (A) 03/01/2018 1021   APPEARANCEUR CLEAR 03/01/2018 1021   LABSPEC 1.007 03/01/2018 1021   PHURINE 5.0 03/01/2018 1021  GLUCOSEU >=500 (A) 03/01/2018 1021   HGBUR SMALL (A) 03/01/2018 1021   HGBUR negative 01/26/2010 1026   BILIRUBINUR NEGATIVE 03/01/2018 1021   BILIRUBINUR NEG 01/01/2011 1050   KETONESUR NEGATIVE 03/01/2018 1021   PROTEINUR 100 (A) 03/01/2018 1021   UROBILINOGEN 0.2 01/09/2015 1333   NITRITE NEGATIVE 03/01/2018 1021   LEUKOCYTESUR TRACE (A) 03/01/2018 1021    Radiological Exams on Admission: Dg Chest 2 View  Result Date: 03/18/2018 CLINICAL DATA:  Pt complains of shortness of breath and "fluid build-up" x 1 month but symptoms getting worse; pt reports hx of DM, HTN, and right kidney transplant in 2007 and states it is failing; smoker EXAM: CHEST - 2 VIEW COMPARISON:  03/01/2018 FINDINGS: Cardiac silhouette is mildly enlarged. There is thickening of the right paratracheal stripe similar to the most recent prior exam. This may be vascular. Adenopathy is possible. Mediastinal contours otherwise unremarkable. No hilar masses or convincing adenopathy. There is linear opacity in lung bases, most prominently on the right, consistent with atelectasis. Remainder of the lungs is clear. No pleural effusion or pneumothorax. Skeletal structures are intact. IMPRESSION: 1. No evidence of pneumonia pulmonary edema. 2. Lung base atelectasis, greater on the right. 3. Prominent right paratracheal stripe in the azygos region which could be due to adenopathy or be vascular in origin. This is similar to the most recent prior exam. 4. Mild cardiomegaly. Electronically Signed   By: Lajean Manes M.D.   On: 03/18/2018 14:17     Assessment/Plan Principal Problem:   Acute renal failure superimposed on stage 4 chronic kidney disease (HCC) Active Problems:   OBESITY   Anemia due  to chronic kidney disease   Essential hypertension, benign   Secondary renal hyperparathyroidism (HCC)   KIDNEY TRANSPLANTATION, HX OF   Type 1 diabetes mellitus with complication, uncontrolled (HCC)   Gastroparesis   Hypocalcemia   Renal failure  Acute kidney injury on CKD stage IV-V: Her kidney function has gradually worsened since last few months.  She follows with nephrology as an outpatient,Dr Lorrene Reid.  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 She was on dialysed from age 68 to age 39 . Nephrology has been consulted.  She might need dialysis on this admission.  On torsemide at home which we will resume for now.  Status post kidney transplant: Underwent renal transplant in 2007.  She had AV graft on bilateral upper extremities in the past.  She follows with the kidney transplant team at transplant center in [place].  She is on mycophenolate, prednisone and tacrolimus at home which we will continue for now.  Most recent biopsy on 7/19 showed borderline cellular rejection on the background of transplant.  Diabetes mellitus type 1: On insulin.  Continue sliding scale and long acting  insulin.  Last hemoglobin A1c of 10.9 as per 01/2018. Continue to monitor blood sugars.  Bilateral lower extremity pain/peripheral neuropathy: Most likely her neuropathy is from her diabetes which is contributing to the bilateral lower extremity pain.  Continue gabapentin.  Needs glycemic control.  Anemia of chronic disease: Hemoglobin of 7.1.  Anemia associated with her chronic renal disease.  Might need transfusion.  Continue to monitor CBC.  Might be transfused during dialysis if started so.  She has been transfused with Procrit in the past.  Hypocalcemia:Calcium level of 5.1.  Corrected calcium will be around 6.3 with calculation from her previous albumin level. She has  secondary hyperparathyroidism.  Will supplement  with calcium.   Continue calcitriol.  Check phosphorus level.  Continue PhosLo  History of diabetic gastroparesis: Denies any abdominal pain at present.  Continue Reglan as needed.  Hypertension: Currently blood pressure stable.  Continue Coreg  Anion gap metabolic acidosis: Due to renal failure.  Continue sodium bicarb tablets.  Chronic pain syndrome: Still complains of pain in bilateral lower extremities.  She has history of chronic opiate use.  History of XXX syndrome/pseudoseizures/intellectual impairment: Continue supportive care  Severity of Illness: The appropriate patient status for this patient is INPATIENT.    DVT prophylaxis: Heparin [place] Code Status: Full code Family Communication: None present at the bedside Consults called: Nephrology called by ED physician     Shelly Coss MD [facility] Pager [phone]  If 7PM-7AM, please contact night-coverage [email or web address] Password [email or web address]  03/18/2018, 5:14 PM

## 2018-03-18 NOTE — ED Triage Notes (Signed)
Pt states she has been having bilateral leg swelling in her lower legs for a month. Pt has a right kidney transplant that her MD recently told her is failing. Pt states she is unable to walk due to pain with lower leg swelling. Pt was sent by her MD to have the fluid removed.

## 2018-03-18 NOTE — ED Provider Notes (Signed)
[facility] EMERGENCY DEPARTMENT Provider Note   CSN: [id number] Arrival date & time: 03/18/18  1152     History   Chief Complaint Chief Complaint  Patient presents with  . Leg Swelling    HPI [name] is a 33 y.o. female with a past medical history of XXX syndrome, DM, CKD status post renal transplant, who presents today for evaluation of bilateral lower leg swelling.  She reports that she has had swelling in her legs for approximately 1 month.  She spoke with her nephrologist Dr. Lorrene Reid yesterday who recommended that she come in.  He reports pain in both her lower legs.  She reports that she has been mildly short of breath for the past few days.  She denies history of blood clots.  She reports that Dr. Lorrene Reid is concerned that her kidney transplant may be failing.  HPI  Past Medical History:  Diagnosis Date  . Chronic kidney disease   . Diabetes mellitus    Pt reports diagnosis in June 2011  . Hyperlipidemia   . Hypertension   . Kidney transplant recipient   . 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 Pyelonephritis 06/23/2014  . UTI (urinary tract infection) 01/09/2015  . 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 Renal failure 03/18/2018  . Cellulitis of left leg 03/01/2018  . Hyperlipidemia 03/01/2018  . Hypocalcemia 03/01/2018  . Metabolic acidosis 90/24/0973  . Hypokalemia 02/03/2018  . Acute renal failure superimposed on stage 4 chronic kidney disease ([place]) 02/03/2018  . Incontinence of bowel 02/03/2018  . Chronic pain 11/11/2017  . Gastroparesis   . AKI (acute kidney injury) ([place]) 07/13/2017  . Constipation 07/13/2017  . Hematemesis 07/02/2017  . Chronic cholecystitis 06/29/2017  . Type 1 diabetes mellitus with complication, uncontrolled ([place]) 05/25/2015  . Nausea & vomiting  01/09/2015  . Acute renal failure superimposed on stage 3 chronic kidney disease ([place]) 01/09/2015  . Renal transplant recipient   . Immunosuppressed status ([place])   . 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 Chronic kidney disease 01/04/2010  . OVARIAN FAILURE, PREMATURE 03/11/2009  . 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 OBESITY 09/25/2007  . Anemia due to chronic kidney disease 09/25/2007  . LEARNING DISABILITY 09/25/2007  . Essential hypertension, benign 09/25/2007  . KIDNEY TRANSPLANTATION, HX OF 09/25/2007    Past Surgical History:  Procedure Laterality Date  . ARTERIOVENOUS GRAFT PLACEMENT Bilateral    "neither work" (10/24/2017)  . CHOLECYSTECTOMY N/A 06/30/2017   Procedure: LAPAROSCOPIC CHOLECYSTECTOMY WITH INTRAOPERATIVE CHOLANGIOGRAM;  Surgeon: Excell Seltzer, MD;  Location: [facility];  Service: General;  Laterality: N/A;  . ESOPHAGOGASTRODUODENOSCOPY (EGD) WITH PROPOFOL N/A 07/04/2017   Procedure: ESOPHAGOGASTRODUODENOSCOPY (EGD) WITH PROPOFOL;  Surgeon: Clarene Essex, MD;  Location: [facility];  Service: Endoscopy;  Laterality: N/A;  . KIDNEY TRANSPLANT  2007  . PARATHYROIDECTOMY  ?2012   "3/4 removed" (10/24/2017)  . RENAL BIOPSY Bilateral 2003     OB History   No obstetric history on file.      Home Medications    Prior to Admission medications   Medication Sig Start Date End Date Taking? Authorizing Provider  acetaminophen (TYLENOL) 500 MG tablet Take 1,000 mg by mouth daily as needed for moderate pain.  Yes [provider]  calcitRIOL (ROCALTROL) 0.25 MCG capsule Take 0.25 mcg by mouth daily.   Yes [provider]  calcium acetate (PHOSLO) 667 MG capsule Take 1,334 mg by mouth 3 (three) times daily with meals.   Yes [provider]  Calcium Carb-Cholecalciferol (CALCIUM 1000 + D PO) Take 1 tablet by mouth daily.    Yes [provider]  calcium  elemental as carbonate (BARIATRIC TUMS ULTRA) 400 MG chewable tablet Chew 1,000 mg by mouth 3 (three) times daily as needed for heartburn.    Yes [provider]  carvedilol (COREG) 12.5 MG tablet Take 12.5 mg by mouth 2 (two) times daily with a meal.   Yes [provider]  dicyclomine (BENTYL) 20 MG tablet Take 1 tablet (20 mg total) by mouth 2 (two) times daily. 02/06/18  Yes [name], Ripudeep K, MD  famotidine (PEPCID) 10 MG tablet Take 10 mg by mouth daily.   Yes [provider]  fluticasone (FLONASE) 50 MCG/ACT nasal spray Place 2 sprays into both nostrils daily. Patient taking differently: Place 2 sprays into both nostrils daily as needed for allergies.  07/20/15  Yes Muthersbaugh, Jarrett Soho, PA-C  gabapentin (NEURONTIN) 300 MG capsule Take 300 mg by mouth 3 (three) times daily.   Yes [provider]  hydrOXYzine (ATARAX/VISTARIL) 50 MG tablet Take 1 tablet (50 mg total) by mouth 2 (two) times daily as needed for itching. 02/06/18  Yes [name], Ripudeep K, MD  insulin aspart (NOVOLOG FLEXPEN) 100 UNIT/ML FlexPen Inject 12-15 Units into the skin See admin instructions. 5-6 times per day as needed   Yes [provider]  insulin degludec (TRESIBA) 100 UNIT/ML SOPN FlexTouch Pen Inject 0.6 mLs (60 Units total) into the skin 2 (two) times daily. Reports taking 24 units QAM 03/06/18  Yes Charlynne Cousins, MD  loperamide (IMODIUM) 2 MG capsule Take 2 mg by mouth as needed for diarrhea or loose stools.    Yes [provider]  metoCLOPramide (REGLAN) 10 MG tablet Take 1 tablet (10 mg total) by mouth 3 (three) times daily before meals. 02/06/18  Yes [name], Ripudeep K, MD  mycophenolate (MYFORTIC) 360 MG TBEC Take 720 mg by mouth 2 (two) times daily.    Yes [provider]  ondansetron (ZOFRAN ODT) 4 MG disintegrating tablet Take 1 tablet (4 mg total) by mouth every 4 (four) hours as needed for nausea or vomiting. 10/26/17  Yes Emokpae, Courage, MD    predniSONE (DELTASONE) 10 MG tablet Take 1 tablet (10 mg total) by mouth every morning. 03/06/18  Yes Charlynne Cousins, MD  promethazine (PHENERGAN) 25 MG tablet Take 1 tablet (25 mg total) by mouth every 6 (six) hours as needed for nausea or vomiting. 02/06/18  Yes [name], Ripudeep K, MD  simvastatin (ZOCOR) 20 MG tablet Take 20 mg by mouth at bedtime.    Yes [provider]  sodium bicarbonate 650 MG tablet Take 650 mg by mouth 3 (three) times daily.   Yes [provider]  tacrolimus (PROGRAF) 1 MG capsule Take 3 mg by mouth 2 (two) times daily.    Yes [provider]  torsemide (DEMADEX) 20 MG tablet Take 4 tablets (80 mg total) by mouth 2 (two) times daily. 03/05/18  Yes Charlynne Cousins, MD  ACCU-CHEK SOFTCLIX LANCETS lancets Use to check blood sugar 4 times per day. 12/29/15   Renato Shin, MD  amoxicillin-clavulanate (AUGMENTIN) 500-125 MG tablet Take 1 tablet (500 mg total) by mouth every  12 (twelve) hours. 03/05/18   Charlynne Cousins, MD    Family History Family History  Problem Relation Age of Onset  . Arthritis Mother   . Diabetes Mother   . Hypertension Mother     Social History Social History   Tobacco Use  . Smoking status: Never Smoker  . Smokeless tobacco: Never Used  Substance Use Topics  . Alcohol use: No  . Drug use: No     Allergies   Benadryl [diphenhydramine-zinc acetate]; Motrin [ibuprofen]; Banana; Diphenhydramine hcl; Doxycycline; Iron dextran; and Shellfish allergy   Review of Systems Review of Systems  Constitutional: Negative for chills and fever.  HENT: Negative for congestion.   Eyes: Negative for visual disturbance.  Respiratory: Negative for chest tightness and shortness of breath.   Cardiovascular: Positive for leg swelling. Negative for chest pain and palpitations.  Gastrointestinal: Negative for abdominal pain, diarrhea, nausea and vomiting.  Genitourinary: Positive for decreased urine volume.   Musculoskeletal: Negative for back pain and myalgias.  Skin: Negative for color change.  Neurological: Negative for weakness and headaches.  All other systems reviewed and are negative.   Physical Exam Updated Vital Signs BP (!) 120/94   Pulse (!) 109   Temp 98.7 F (37.1 C) (Oral)   Resp 17   Ht 5\' 6"  (1.676 m)   Wt 86.6 kg   SpO2 99%   BMI 30.83 kg/m   Physical Exam Vitals signs and nursing note reviewed.  Constitutional:      General: She is not in acute distress.    Appearance: She is well-developed. She is not ill-appearing.  HENT:     Head: Normocephalic and atraumatic.  Eyes:     Conjunctiva/sclera: Conjunctivae normal.  Neck:     Musculoskeletal: Neck supple.  Cardiovascular:     Rate and Rhythm: Regular rhythm. Tachycardia present.     Heart sounds: No murmur.  Pulmonary:     Effort: Pulmonary effort is normal. No respiratory distress.     Breath sounds: Normal breath sounds. No rhonchi or rales.  Abdominal:     Palpations: Abdomen is soft.     Tenderness: There is no abdominal tenderness.  Musculoskeletal:     Right lower leg: Edema present.     Left lower leg: Edema present.     Comments: Bilateral lower extremity edema, left greater than right with TTP bilaterally and redness bilaterally.    Skin:    General: Skin is warm and dry.  Neurological:     Mental Status: She is alert.      ED Treatments / Results  Labs (all labs ordered are listed, but only abnormal results are displayed) Labs Reviewed  BASIC METABOLIC PANEL - Abnormal; Notable for the following components:      Result Value   Chloride 113 (*)    CO2 16 (*)    Glucose, Bld 184 (*)    BUN 97 (*)    Creatinine, Ser 7.04 (*)    Calcium 5.1 (*)    GFR calc non Af Amer 7 (*)    GFR calc Af Amer 8 (*)    All other components within normal limits  CBC WITH DIFFERENTIAL/PLATELET - Abnormal; Notable for the following components:   RBC 2.51 (*)    Hemoglobin 7.1 (*)    HCT 24.2 (*)     MCHC 29.3 (*)    Eosinophils Absolute 0.7 (*)    All other components within normal limits  BASIC METABOLIC PANEL  CBC  EKG EKG Interpretation  Date/Time:  Saturday March 18 2018 12:40:38 EST Ventricular Rate:  111 PR Interval:    QRS Duration: 83 QT Interval:  363 QTC Calculation: 494 R Axis:   75 Text Interpretation:  Sinus tachycardia Borderline prolonged QT interval Baseline wander in lead(s) V2 No significant change since last tracing Confirmed by Orlie Dakin [phone]) on 03/18/2018 1:22:08 PM   Radiology Dg Chest 2 View  Result Date: 03/18/2018 CLINICAL DATA:  Pt complains of shortness of breath and "fluid build-up" x 1 month but symptoms getting worse; pt reports hx of DM, HTN, and right kidney transplant in 2007 and states it is failing; smoker EXAM: CHEST - 2 VIEW COMPARISON:  03/01/2018 FINDINGS: Cardiac silhouette is mildly enlarged. There is thickening of the right paratracheal stripe similar to the most recent prior exam. This may be vascular. Adenopathy is possible. Mediastinal contours otherwise unremarkable. No hilar masses or convincing adenopathy. There is linear opacity in lung bases, most prominently on the right, consistent with atelectasis. Remainder of the lungs is clear. No pleural effusion or pneumothorax. Skeletal structures are intact. IMPRESSION: 1. No evidence of pneumonia pulmonary edema. 2. Lung base atelectasis, greater on the right. 3. Prominent right paratracheal stripe in the azygos region which could be due to adenopathy or be vascular in origin. This is similar to the most recent prior exam. 4. Mild cardiomegaly. Electronically Signed   By: Lajean Manes M.D.   On: 03/18/2018 14:17    Procedures Procedures (including critical care time)  Medications Ordered in ED Medications  morphine 2 MG/ML injection 2 mg (2 mg Intravenous Given 03/18/18 1320)     Initial Impression / Assessment and Plan / ED Course  I have reviewed the triage vital signs  and the nursing notes.  Pertinent labs & imaging results that were available during my care of the patient were reviewed by me and considered in my medical decision making (see chart for details).  Clinical Course as of Mar 19 1707  Sat Mar 18, 2018  1524 Asked secretary to call nephrology   [EH]  [phone] Spoke with Dr. Grayland Ormond who will evaluate patient, and address hypocalcemia.    [EH]  1618 Spoke with hosptialist who will admit patient.    [EH]    Clinical Course User Index [EH] Lorin Glass, PA-C   Patient is a 33 year old woman status post renal transplant, who presents today for evaluation of worsening bilateral lower extremity edema.  Dr. Lorrene Reid is her primary nephrologist who told her yesterday that she should come in.  Over the past 2 weeks her creatinine has significantly elevated.  From 5.49 on 03/02/2018 up to 7.04 today.  Her calcium is low at 5.1.  Her chest x-ray shows no evidence of pneumonia or pulmonary edema.  Chart review shows that she had a bilateral venous duplex in December that did not show evidence of DVT.  Her hemoglobin is 7.1 which is roughly consistent with her baseline.  I spoke with nephrology Dr. Grayland Ormond who recommends medical admission.  states that she will evaluate the patient and address hypocalcemia.  I spoke with hospitalist who agreed to admit patient.  This patient was seen as a shared visit with Dr. Winfred Leeds.     Final Clinical Impressions(s) / ED Diagnoses   Final diagnoses:  Bilateral lower extremity edema  Renal failure, unspecified chronicity  KIDNEY TRANSPLANTATION, HX OF  Hypervolemia, unspecified hypervolemia type    ED Discharge Orders    None  Lorin Glass, PA-C 03/18/18 1719    Orlie Dakin, MD 03/19/18 1052

## 2018-03-18 NOTE — Consult Note (Signed)
[facility]    NEPHROLOGY CONSULTATION NOTE  PATIENT ID:  [name], DOB:  07/15/1985  HPI: The patient is a 33 y.o. 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  She has a history of a renal transplantation in 2007 at [facility].  She has a history of [place] 1 a rejection with weeks C4 positivity in July 2009 status post IV steroids and increase Myfortic.  She had a biopsy at that time that showed moderate to severe interstitial fibrosis.  She subsequently had worsening renal function in August 2019 and underwent a transplant renal biopsy at [facility] in [place] which showed multiple issues including chronic transplant rejection, diabetic changes, mild I-S rejection and interstitial atrophy and transplant glomerulopathy.  At that time, her creatinine was 2.8.  Her prednisone was increased to 10 mg twice daily and her Prograf to 3 mg twice daily.  Discussion at that time with her transplant team felt that her renal prognosis was overall not good.  She was admitted to [facility] on March 01, 2018 for a creatinine of 5.6.  At that time, she was treated for lower extremity cellulitis.  She follows up with Dr. Lorrene Reid as an outpatient.  She presents today for bilateral lower extremity swelling, pain, and shortness of breath that has been worsening over the past week.     Past Medical History:  Diagnosis Date  . Chronic kidney disease   . Diabetes mellitus    Pt reports diagnosis in June 2011  . Hyperlipidemia   . Hypertension   . Kidney transplant recipient   . 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 Pyelonephritis 06/23/2014  . UTI (urinary tract infection) 01/09/2015  . 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 ARTERIOVENOUS GRAFT PLACEMENT Bilateral    "neither work" (10/24/2017)  . CHOLECYSTECTOMY N/A 06/30/2017   Procedure: LAPAROSCOPIC CHOLECYSTECTOMY WITH INTRAOPERATIVE CHOLANGIOGRAM;  Surgeon: Excell Seltzer, MD;  Location: [facility]S;  Service: General;  Laterality: N/A;  . ESOPHAGOGASTRODUODENOSCOPY (EGD) WITH PROPOFOL N/A 07/04/2017   Procedure: ESOPHAGOGASTRODUODENOSCOPY (EGD) WITH PROPOFOL;  Surgeon: Clarene Essex, MD;  Location: [facility];  Service: Endoscopy;  Laterality: N/A;  . KIDNEY TRANSPLANT  2007  . PARATHYROIDECTOMY  ?2012   "3/4 removed" (10/24/2017)  . RENAL BIOPSY Bilateral 2003    Family History  Problem Relation Age of Onset  . Arthritis Mother   . Diabetes Mother   . Hypertension Mother     Social History   Tobacco Use  . Smoking status: Never Smoker  . Smokeless tobacco: Never Used  Substance Use Topics  . Alcohol use: No  . Drug use: No    REVIEW OF SYSTEMS: General: Positive fatigue, no weakness Head:  no headaches Eyes:  no blurred vision ENT:  no sore throat Neck:  no masses CV:  no chest pain, no orthopnea, increased lower extremity edema Lungs: Positive shortness of breath, no cough GI:  no nausea or vomiting, no diarrhea GU:  no dysuria or hematuria Skin:  no rashes or lesions Neuro:  no  focal numbness or weakness Psych:  no depression or anxiety Musculoskeletal: Bilateral lower extremity pain    PHYSICAL EXAM:  Vitals:   03/18/18 1300 03/18/18 1315  BP: (!) 131/99 (!) 120/94  Pulse: (!) 108 (!) 109  Resp: 17 17  Temp:    SpO2: 98% 99%   No intake/output data recorded.   General:  AAOx3 moderate distress due to bilateral lower extremity pain HEENT: MMM [place] AT anicteric sclera Neck:  No JVD, no adenopathy CV:   Heart RRR  Lungs:  L/S CTA bilaterally Abd:  abd SNT/ND with normal BS GU:  Bladder non-palpable Extremities: +3 bilateral lower extremity edema Skin:  No skin rash Psych:  normal mood and affect Neuro:  no focal deficits  MEDICATIONS:   Current Facility-Administered Medications:  .  [START ON 03/19/2018] calcitRIOL (ROCALTROL) capsule 0.25 mcg, 0.25 mcg, Oral, Daily, Adhikari, Amrit, MD .  calcium acetate (PHOSLO) capsule 1,334 mg, 1,334 mg, Oral, TID WC, Adhikari, Amrit, MD .  calcium gluconate 1 g/ 50 mL sodium chloride IVPB, 1 g, Intravenous, Once, Adhikari, Amrit, MD .  carvedilol (COREG) tablet 12.5 mg, 12.5 mg, Oral, BID WC, Adhikari, Amrit, MD .  Derrill Memo ON 03/19/2018] famotidine (PEPCID) tablet 10 mg, 10 mg, Oral, Daily, Adhikari, Amrit, MD .  gabapentin (NEURONTIN) capsule 400 mg, 400 mg, Oral, TID, Adhikari, Amrit, MD .  heparin injection 5,000 Units, 5,000 Units, Subcutaneous, Q8H, Adhikari, Amrit, MD .  Derrill Memo ON 03/19/2018] insulin aspart (novoLOG) injection 0-15 Units, 0-15 Units, Subcutaneous, TID WC, Adhikari, Amrit, MD .  insulin aspart (novoLOG) injection 0-5 Units, 0-5 Units, Subcutaneous, QHS, Adhikari, Amrit, MD .  insulin glargine (LANTUS) injection 25 Units, 25 Units, Subcutaneous, QHS, Adhikari, Amrit, MD .  metoCLOPramide (REGLAN) tablet 10 mg, 10 mg, Oral, Q6H PRN, Adhikari, Amrit, MD .  metolazone (ZAROXOLYN) tablet 5 mg, 5 mg, Oral, Daily, Krisy Dix A, DO .  mycophenolate (MYFORTIC) EC tablet 720 mg, 720 mg, Oral, BID, Shelly Coss, MD .  Derrill Memo ON 03/19/2018] predniSONE (DELTASONE) tablet 10 mg, 10 mg, Oral, Q breakfast, Adhikari, Amrit, MD .  simvastatin (ZOCOR) tablet 20 mg, 20 mg, Oral, QHS, Adhikari, Amrit, MD .  sodium bicarbonate tablet 650 mg, 650 mg, Oral, TID, Adhikari, Amrit, MD .  tacrolimus (PROGRAF) capsule 3 mg, 3 mg, Oral, BID, Adhikari, Amrit, MD .  torsemide (DEMADEX) tablet 80 mg, 80 mg, Oral, BID, Adhikari, Amrit, MD, 80 mg at 03/18/18  1842  Current Outpatient Medications:  .  acetaminophen (TYLENOL) 500 MG tablet, Take 1,000 mg by mouth daily as needed for moderate pain. , Disp: , Rfl:  .  calcitRIOL (ROCALTROL) 0.25 MCG capsule, Take 0.25 mcg by mouth daily., Disp: , Rfl:  .  calcium acetate (PHOSLO) 667 MG capsule, Take 1,334 mg by mouth 3 (three) times daily with meals., Disp: , Rfl:  .  Calcium Carb-Cholecalciferol (CALCIUM 1000 + D PO), Take 1 tablet by mouth daily. , Disp: , Rfl:  .  calcium elemental as carbonate (BARIATRIC TUMS ULTRA) 400 MG chewable tablet, Chew 1,000 mg by mouth 3 (three) times daily as needed for heartburn. , Disp: , Rfl:  .  carvedilol (COREG) 12.5 MG tablet, Take 12.5 mg by mouth 2 (two) times daily with a meal., Disp: , Rfl:  .  dicyclomine (BENTYL) 20 MG tablet, Take 1 tablet (20 mg total) by mouth 2 (two) times daily., Disp: 60 tablet, Rfl: 0 .  famotidine (PEPCID) 10 MG tablet, Take 10 mg by mouth daily., Disp: ,  Rfl:  .  fluticasone (FLONASE) 50 MCG/ACT nasal spray, Place 2 sprays into both nostrils daily. (Patient taking differently: Place 2 sprays into both nostrils daily as needed for allergies. ), Disp: 9.9 g, Rfl: 2 .  gabapentin (NEURONTIN) 300 MG capsule, Take 300 mg by mouth 3 (three) times daily., Disp: , Rfl:  .  hydrOXYzine (ATARAX/VISTARIL) 50 MG tablet, Take 1 tablet (50 mg total) by mouth 2 (two) times daily as needed for itching., Disp: 30 tablet, Rfl: 0 .  insulin aspart (NOVOLOG FLEXPEN) 100 UNIT/ML FlexPen, Inject 12-15 Units into the skin See admin instructions. 5-6 times per day as needed, Disp: , Rfl:  .  insulin degludec (TRESIBA) 100 UNIT/ML SOPN FlexTouch Pen, Inject 0.6 mLs (60 Units total) into the skin 2 (two) times daily. Reports taking 24 units QAM, Disp: 4 pen, Rfl: 3 .  loperamide (IMODIUM) 2 MG capsule, Take 2 mg by mouth as needed for diarrhea or loose stools. , Disp: , Rfl:  .  metoCLOPramide (REGLAN) 10 MG tablet, Take 1 tablet (10 mg total) by mouth 3 (three)  times daily before meals., Disp: 90 tablet, Rfl: 3 .  mycophenolate (MYFORTIC) 360 MG TBEC, Take 720 mg by mouth 2 (two) times daily. , Disp: , Rfl:  .  ondansetron (ZOFRAN ODT) 4 MG disintegrating tablet, Take 1 tablet (4 mg total) by mouth every 4 (four) hours as needed for nausea or vomiting., Disp: 20 tablet, Rfl: 2 .  predniSONE (DELTASONE) 10 MG tablet, Take 1 tablet (10 mg total) by mouth every morning., Disp: 30 tablet, Rfl: 3 .  promethazine (PHENERGAN) 25 MG tablet, Take 1 tablet (25 mg total) by mouth every 6 (six) hours as needed for nausea or vomiting., Disp: 30 tablet, Rfl: 0 .  simvastatin (ZOCOR) 20 MG tablet, Take 20 mg by mouth at bedtime. , Disp: , Rfl:  .  sodium bicarbonate 650 MG tablet, Take 650 mg by mouth 3 (three) times daily., Disp: , Rfl:  .  tacrolimus (PROGRAF) 1 MG capsule, Take 3 mg by mouth 2 (two) times daily. , Disp: , Rfl:  .  torsemide (DEMADEX) 20 MG tablet, Take 4 tablets (80 mg total) by mouth 2 (two) times daily., Disp: 120 tablet, Rfl: 2 .  ACCU-CHEK SOFTCLIX LANCETS lancets, Use to check blood sugar 4 times per day., Disp: 150 each, Rfl: 5 .  amoxicillin-clavulanate (AUGMENTIN) 500-125 MG tablet, Take 1 tablet (500 mg total) by mouth every 12 (twelve) hours., Disp: 6 tablet, Rfl: 0     LABS:  CBC Latest Ref Rng & Units 03/18/2018 03/03/2018 03/02/2018  WBC 4.0 - 10.5 K/uL 10.0 10.8(H) 13.0(H)  Hemoglobin 12.0 - 15.0 g/dL 7.1(L) 8.0(L) 8.8(L)  Hematocrit 36.0 - 46.0 % 24.2(L) 26.2(L) 29.1(L)  Platelets 150 - 400 K/uL 187 123(L) 123(L)    CMP Latest Ref Rng & Units 03/18/2018 03/04/2018 03/03/2018  Glucose 70 - 99 mg/dL 184(H) 246(H) 180(H)  BUN 6 - 20 mg/dL 97(H) 108(H) 107(H)  Creatinine 0.44 - 1.00 mg/dL 7.04(H) 6.78(H) 6.27(H)  Sodium 135 - 145 mmol/L 139 137 137  Potassium 3.5 - 5.1 mmol/L 4.5 3.3(L) 3.3(L)  Chloride 98 - 111 mmol/L 113(H) 105 107  CO2 22 - 32 mmol/L 16(L) 17(L) 14(L)  Calcium 8.9 - 10.3 mg/dL 5.1(LL) 6.8(L) 6.5(L)  Total  Protein 6.5 - 8.1 g/dL - - -  Total Bilirubin 0.3 - 1.2 mg/dL - - -  Alkaline Phos 38 - 126 U/L - - -  AST 15 -  41 U/L - - -  ALT 0 - 44 U/L - - -    Lab Results  Component Value Date   CALCIUM 5.1 (LL) 03/18/2018   CAION 0.92 (L) 07/06/2017   PHOS 5.9 (H) 03/02/2018       Component Value Date/Time   COLORURINE STRAW (A) 03/01/2018 1021   APPEARANCEUR CLEAR 03/01/2018 1021   LABSPEC 1.007 03/01/2018 1021   PHURINE 5.0 03/01/2018 1021   GLUCOSEU >=500 (A) 03/01/2018 1021   HGBUR SMALL (A) 03/01/2018 1021   HGBUR negative 01/26/2010 1026   BILIRUBINUR NEGATIVE 03/01/2018 1021   BILIRUBINUR NEG 01/01/2011 1050   KETONESUR NEGATIVE 03/01/2018 1021   PROTEINUR 100 (A) 03/01/2018 1021   UROBILINOGEN 0.2 01/09/2015 1333   NITRITE NEGATIVE 03/01/2018 1021   LEUKOCYTESUR TRACE (A) 03/01/2018 1021      Component Value Date/Time   PHART 7.326 (L) 10/21/2017 0753   PCO2ART 37.2 10/21/2017 0753   PO2ART 93.0 10/21/2017 0753   HCO3 19.4 (L) 10/21/2017 0753   TCO2 21 (L) 10/21/2017 0753   ACIDBASEDEF 6.0 (H) 10/21/2017 0753   O2SAT 97.0 10/21/2017 0753       Component Value Date/Time   IRON 65 03/03/2018 0843   TIBC 126 (L) 03/03/2018 0843   FERRITIN 1,383 (H) 12/08/2017 1156   IRONPCTSAT 52 (H) 03/03/2018 0843       ASSESSMENT/PLAN:     Problem List Items Addressed This Visit      Genitourinary   Renal failure     Other   KIDNEY TRANSPLANTATION, HX OF   Relevant Medications   predniSONE (DELTASONE) tablet 10 mg (Start on 03/19/2018  8:00 AM)   mycophenolate (MYFORTIC) EC tablet 720 mg (Start on 03/18/2018 10:00 PM)   tacrolimus (PROGRAF) capsule 3 mg (Start on 03/18/2018 10:00 PM)    Other Visit Diagnoses    Bilateral lower extremity edema    -  Primary   Hypervolemia, unspecified hypervolemia type          1.  Chronic kidney disease stage V.  I suspect she will need to start dialysis during this admission.  Will administer IV diuretics today and reevaluate in the  morning.  No urgency for hemodialysis.  She has bilateral upper extremity failed fistulas.  Will ultimately need a dialysis catheter placed and new access placed.  2.  Status post kidney transplantation.  Will continue immunosuppressants for now.  3.  Anemia of chronic disease.  Will add Procrit.  We will also check iron stores.  4.  Hypocalcemia.  Will check 25 hydroxy vitamin D level.  Will increase calcitriol.  5.  Hypertension.  Continue Coreg.  Well controlled.  6.  Diabetes mellitus.  Overall poor control with most recent hemoglobin A1c in November at 10.9.  Suspect this is largely the etiology of her worsening renal function.  Will check urine protein to creatinine ratio.  7.  Anion gap metabolic acidosis.  Likely secondary to chronic kidney disease.  Continue sodium bicarbonate.    [place], DO, [place]

## 2018-03-18 NOTE — ED Notes (Signed)
Patient transported to X-ray 

## 2018-03-18 NOTE — ED Notes (Signed)
EDP made aware of Calcium

## 2018-03-19 DIAGNOSIS — N185 Chronic kidney disease, stage 5: Secondary | ICD-10-CM

## 2018-03-19 LAB — COMPREHENSIVE METABOLIC PANEL
ALBUMIN: 2.6 g/dL — AB (ref 3.5–5.0)
ALT: 13 U/L (ref 0–44)
AST: 12 U/L — AB (ref 15–41)
Alkaline Phosphatase: 34 U/L — ABNORMAL LOW (ref 38–126)
Anion gap: 13 (ref 5–15)
BILIRUBIN TOTAL: 0.6 mg/dL (ref 0.3–1.2)
BUN: 96 mg/dL — ABNORMAL HIGH (ref 6–20)
CO2: 14 mmol/L — ABNORMAL LOW (ref 22–32)
Calcium: 4.9 mg/dL — CL (ref 8.9–10.3)
Chloride: 110 mmol/L (ref 98–111)
Creatinine, Ser: 7.61 mg/dL — ABNORMAL HIGH (ref 0.44–1.00)
GFR calc non Af Amer: 6 mL/min — ABNORMAL LOW (ref >60)
GFR, EST AFRICAN AMERICAN: 7 mL/min — AB (ref >60)
Glucose, Bld: 198 mg/dL — ABNORMAL HIGH (ref 70–99)
Potassium: 4.1 mmol/L (ref 3.5–5.1)
Sodium: 137 mmol/L (ref 135–145)
Total Protein: 6.2 g/dL — ABNORMAL LOW (ref 6.5–8.1)

## 2018-03-19 LAB — PHOSPHORUS: Phosphorus: 8.3 mg/dL — ABNORMAL HIGH (ref 2.5–4.6)

## 2018-03-19 LAB — CBC
HCT: 22.9 % — ABNORMAL LOW (ref 36.0–46.0)
Hemoglobin: 6.7 g/dL — CL (ref 12.0–15.0)
MCH: 28 pg (ref 26.0–34.0)
MCHC: 29.3 g/dL — ABNORMAL LOW (ref 30.0–36.0)
MCV: 95.8 fL (ref 80.0–100.0)
Platelets: 185 10*3/uL (ref 150–400)
RBC: 2.39 MIL/uL — ABNORMAL LOW (ref 3.87–5.11)
RDW: 13.7 % (ref 11.5–15.5)
WBC: 9.2 10*3/uL (ref 4.0–10.5)
nRBC: 0 % (ref 0.0–0.2)

## 2018-03-19 LAB — CBG MONITORING, ED
Glucose-Capillary: 259 mg/dL — ABNORMAL HIGH (ref 70–99)
Glucose-Capillary: 175 mg/dL — ABNORMAL HIGH (ref 70–99)
Glucose-Capillary: 195 mg/dL — ABNORMAL HIGH (ref 70–99)

## 2018-03-19 LAB — PREPARE RBC (CROSSMATCH)

## 2018-03-19 LAB — GLUCOSE, CAPILLARY
Glucose-Capillary: 285 mg/dL — ABNORMAL HIGH (ref 70–99)
Glucose-Capillary: 289 mg/dL — ABNORMAL HIGH (ref 70–99)

## 2018-03-19 MED ORDER — OXYCODONE-ACETAMINOPHEN 5-325 MG PO TABS
1.0000 | ORAL_TABLET | Freq: Four times a day (QID) | ORAL | Status: DC | PRN
  Administered 2018-03-20: 1 via ORAL
  Filled 2018-03-19: qty 1

## 2018-03-19 MED ORDER — ACETAMINOPHEN 325 MG PO TABS
650.0000 mg | ORAL_TABLET | Freq: Four times a day (QID) | ORAL | Status: DC | PRN
  Administered 2018-03-19 – 2018-03-23 (×4): 650 mg via ORAL
  Filled 2018-03-19 (×4): qty 2

## 2018-03-19 MED ORDER — CHLORHEXIDINE GLUCONATE CLOTH 2 % EX PADS
6.0000 | MEDICATED_PAD | Freq: Every day | CUTANEOUS | Status: DC
  Administered 2018-03-21 – 2018-03-24 (×4): 6 via TOPICAL

## 2018-03-19 MED ORDER — CALCIUM GLUCONATE-NACL 1-0.675 GM/50ML-% IV SOLN: 1.0000 g | Freq: Once | INTRAVENOUS | Status: DC

## 2018-03-19 MED ORDER — CALCIUM CARBONATE ANTACID 500 MG PO CHEW
800.0000 mg | CHEWABLE_TABLET | Freq: Two times a day (BID) | ORAL | Status: DC
  Administered 2018-03-19 – 2018-03-20 (×4): 800 mg via ORAL
  Filled 2018-03-19 (×3): qty 4

## 2018-03-19 MED ORDER — SODIUM CHLORIDE 0.9% IV SOLUTION
Freq: Once | INTRAVENOUS | Status: AC
  Administered 2018-03-19: 10:00:00 via INTRAVENOUS

## 2018-03-19 MED ORDER — DARBEPOETIN ALFA 60 MCG/0.3ML IJ SOSY
60.0000 ug | PREFILLED_SYRINGE | INTRAMUSCULAR | Status: DC
  Administered 2018-03-20: 60 ug via INTRAVENOUS
  Filled 2018-03-19: qty 0.3

## 2018-03-19 MED ORDER — CALCIUM GLUCONATE-NACL 2-0.675 GM/100ML-% IV SOLN
2.0000 g | INTRAVENOUS | Status: AC
  Administered 2018-03-19: 2000 mg via INTRAVENOUS
  Filled 2018-03-19 (×2): qty 100
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
## 2018-03-19 NOTE — ED Notes (Signed)
BFAST TRAY ORDERED 
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
## 2018-03-19 NOTE — ED Notes (Signed)
Attempted to call report to 5w, RN will call back (off floor)

## 2018-03-19 NOTE — Progress Notes (Addendum)
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 Admitted and seen yesterday with progressive hypervolemia/edema despite high-dose torsemide; aggressive renal failure, creatinine L7.6, BUN 96. . Seen in ED, patient without immediate complaints . Recommended starting dialysis, she is in agreement . Needs permanent access, has significant fear of IV contrast, is okay with receiving tunneled catheter  No intake/output data recorded.  Filed Weights   03/18/18 1228  Weight: 86.6 kg    Scheduled Meds: . calcitRIOL  1 mcg Oral Daily  . calcium acetate  1,334 mg Oral TID WC  . famotidine  10 mg Oral Daily  . furosemide  80 mg Intravenous BID  . gabapentin  400 mg Oral TID  . insulin aspart  0-15 Units Subcutaneous TID WC  . insulin aspart  0-5 Units Subcutaneous QHS  . insulin glargine  25 Units Subcutaneous QHS  . metolazone  5 mg Oral Daily  . mycophenolate  720 mg Oral BID  . predniSONE  10 mg Oral Q breakfast  . simvastatin  20 mg Oral QHS  . sodium bicarbonate  650 mg Oral TID  . tacrolimus  3 mg Oral BID   Continuous Infusions: PRN Meds:.acetaminophen, metoCLOPramide, oxyCODONE-acetaminophen  Current Labs: reviewed    Physical Exam:  Blood pressure 124/87, pulse 94, temperature 98.9 F (37.2 C), temperature source Oral, resp. rate 12, height 5\' 6"  (1.676 m), weight 86.6 kg, SpO2 100 %. NAD RRR no rub CTAB nl wob 3+ LEE, pitting, tender S/nt/nd No rashes/lesions  A 1. 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 S/p KT on Tac/MMF/Pred, can continue for now but will need gradual taper 3. Anemia of CKD 4. CKD-BMD, on PhosLo, C3 5. No HD access, failed old access in UEs b/l 6. Hypocalcemia, 4.9 albumin 2.6; s/p Calcium gluconate;   P . [facility] consult for [facility] and AV access planning . Start Tums w/o food . Will need CLIP tomorrow . HD tomorrow post HD: 1L UF, 2.5h, Qb 250, 3.5Ca bath, 2K bath . Check Fe stores, start aranesp  tomorrow . Check PTH . Cont PhosLo and C3 for now . Medication Issues; o Preferred narcotic agents for pain control are hydromorphone, fentanyl, and methadone. Morphine should not be used.  o Baclofen should be avoided o Avoid oral sodium phosphate and magnesium citrate based laxatives / bowel preps    Pearson Grippe MD 03/19/2018, 2:12 PM  Recent Labs  Lab 03/18/18 1313 03/19/18 0443  NA 139 137  K 4.5 4.1  CL 113* 110  CO2 16* 14*  GLUCOSE 184* 198*  BUN 97* 96*  CREATININE 7.04* 7.61*  CALCIUM 5.1* 4.9*   Recent Labs  Lab 03/18/18 1313 03/19/18 0443  WBC 10.0 9.2  NEUTROABS 6.0  --   HGB 7.1* 6.7*  HCT 24.2* 22.9*  MCV 96.4 95.8  PLT 187 185

## 2018-03-19 NOTE — ED Notes (Signed)
Critical Hgb sent via secure message to admitting provider.

## 2018-03-19 NOTE — ED Notes (Signed)
SBP in 80's and pending blood tx, paged MD to confirm ok to give am doses of heparin, iv lasix, metolazone, and beta blockers

## 2018-03-19 NOTE — ED Notes (Signed)
Critical calcium of 4.9 reported to admitting MD via secure message.  No new orders at this time for calcium or Hgb.

## 2018-03-19 NOTE — ED Notes (Signed)
Ordered diet tray for pt  

## 2018-03-19 NOTE — ED Notes (Signed)
Pt's CBG result was 175. Informed Megan - RN.

## 2018-03-19 NOTE — Progress Notes (Signed)
PROGRESS NOTE    [name]  CZY:[id number] DOB: Jun 02, 1985 DOA: 03/18/2018 PCP: [name], MD   Brief Narrative:  [name] is a 33 y.o. 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  Patient has been admitted here numerous times in the past.  Her last admission was in December when she presented with similar complaints.  She was diuresed with IV Lasix and was discharged to home. Patient was admitted for the management of end-stage renal disease.  Nephrology consulted.  Plans to start on dialysis tomorrow.  Assessment & Plan:   Principal Problem:   Acute renal failure superimposed on stage 4 chronic kidney disease (HCC) Active Problems:   OBESITY   Anemia due to chronic kidney disease   Essential hypertension, benign   Secondary renal hyperparathyroidism (HCC)   KIDNEY TRANSPLANTATION, HX OF   Type 1 diabetes mellitus with complication, uncontrolled (HCC)   Gastroparesis   Hypocalcemia   Renal failure   Acute kidney injury on CKD stage V: Her kidney function has gradually worsened since last few months.  She follows with nephrology as an outpatient,Dr Lorrene Reid.  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 She was on dialysed from age 27 to age 39 . Nephrology has been consulted.  Started on Lasix 80 mg IV twice daily.  Plan is to start dialysis tomorrow.  She needs temporary dialysis catheter and possible new AV graft.  She has failed AV grafts on her bilateral upper extremities.  Anemia of chronic disease: Hemoglobin of 6.7 today.  No acute blood loss. Anemia associated with  her chronic renal disease.  She needs trasfusion but we will do that only on her dialysis session,so likely tomorrow.  I have discussed this with nephrology, Dr. Georges Lynch.  Continue to monitor CBC.  She has been transfused with Procrit in the past.  Status post kidney transplant: Underwent renal transplant in 2007.  She had failed AV graft on bilateral upper extremities in the past.  She follows with the kidney transplant team at transplant center in [place].  She is on mycophenolate, prednisone and tacrolimus at home which we will continue for now.  Most recent biopsy on 7/19 showed borderline cellular rejection on the background of transplant.  Diabetes mellitus type 1: On insulin.  Continue sliding scale and long acting  insulin.  Last hemoglobin A1c of 10.9 as per 01/2018. Continue to monitor blood sugars.  Bilateral lower extremity pain/peripheral neuropathy: Most likely her neuropathy is from her diabetes which is contributing to the bilateral lower extremity pain.  Continue gabapentin.  Needs glycemic control.  Hypocalcemia:Severe. She has  secondary hyperparathyroidism.  Will supplement with calcium.  Continue calcitriol.  Check phosphorus level.  Continue PhosLo  History of diabetic gastroparesis: Denies any abdominal pain at present.  Continue Reglan as needed.  Hypertension: Currently blood pressure stable.  Continue Coreg  Anion gap metabolic acidosis: Due to renal failure.  Continue sodium bicarb tablets.  Chronic pain syndrome: Still complains of pain in bilateral lower extremities.  She has history of chronic opiate use.  History of XXX syndrome/pseudoseizures/intellectual impairment: Continue supportive care         DVT prophylaxis:SCD Code Status:  Full Family Communication: Boyfriend present at the bedside Disposition Plan: Needs prolonged hospitalization.  Undetermined at this point   Consultants: Nephrology  Procedures: None  Antimicrobials:  None  Subjective: Patient seen and examined the bedside this morning.  Still complains of bilateral lower extremity pain.  Otherwise looked comfortable.  Not in respiratory distress.  Denies any chest pain.  Objective: Vitals:   03/19/18 0830 03/19/18 0845 03/19/18 0900 03/19/18 0915  BP: (!) 97/54 (!) 89/63 97/82 104/77  Pulse: 90 92 94 94  Resp: 12 15 15 17   Temp:      TempSrc:      SpO2: 99% 98% 97% 96%  Weight:      Height:       No intake or output data in the 24 hours ending 03/19/18 1142 Filed Weights   03/18/18 1228  Weight: 86.6 kg    Examination:  General exam: Not in distress,average built HEENT:PERRL,Oral mucosa moist, Ear/Nose normal on gross exam Respiratory system: Trace basal crackles, otherwise clear Cardiovascular system: S1 & S2 heard, RRR. No JVD, murmurs, rubs, gallops or clicks. Pedal edema. Gastrointestinal system: Abdomen is nondistended, soft and nontender. No organomegaly or masses felt. Normal bowel sounds heard. Central nervous system: Alert and oriented. No focal neurological deficits. Extremities: 1-2+ bilateral lower extremity edema, no clubbing ,no cyanosis, distal peripheral pulses palpable. Skin: No rashes, lesions or ulcers,no icterus ,no pallor MSK: Normal muscle bulk,tone ,power Psychiatry: Judgement and insight appear normal. Mood & affect appropriate.     Data Reviewed: I have personally reviewed following labs and imaging studies  CBC: Recent Labs  Lab 03/18/18 1313 03/19/18 0443  WBC 10.0 9.2  NEUTROABS 6.0  --   HGB 7.1* 6.7*  HCT 24.2* 22.9*  MCV 96.4 95.8  PLT 187 488   Basic Metabolic Panel: Recent Labs  Lab 03/18/18 1313 03/19/18 0443  NA 139 137  K 4.5 4.1  CL 113* 110  CO2 16* 14*  GLUCOSE 184* 198*  BUN 97* 96*  CREATININE 7.04* 7.61*  CALCIUM 5.1* 4.9*   GFR: Estimated Creatinine Clearance: 11.5 mL/min (A) (by C-G formula based on SCr of 7.61 mg/dL (H)). Liver Function Tests: Recent Labs  Lab  03/19/18 0443  AST 12*  ALT 13  ALKPHOS 34*  BILITOT 0.6  PROT 6.2*  ALBUMIN 2.6*   No results for input(s): LIPASE, AMYLASE in the last 168 hours. No results for input(s): AMMONIA in the last 168 hours. Coagulation Profile: No results for input(s): INR, PROTIME in the last 168 hours. Cardiac Enzymes: No results for input(s): CKTOTAL, CKMB, CKMBINDEX, TROPONINI in the last 168 hours. BNP (last 3 results) No results for input(s): PROBNP in the last 8760 hours. HbA1C: No results for input(s): HGBA1C in the last 72 hours. CBG: Recent Labs  Lab 03/19/18 0008 03/19/18 1015  GLUCAP 259* 175*   Lipid Profile: No results for input(s): CHOL, HDL, LDLCALC, TRIG, CHOLHDL, LDLDIRECT in the last 72 hours. Thyroid Function Tests: No results for input(s): TSH, T4TOTAL, FREET4, T3FREE, THYROIDAB in the last 72 hours. Anemia Panel: No results for input(s): VITAMINB12, FOLATE, FERRITIN, TIBC, IRON, RETICCTPCT in the last 72 hours. Sepsis Labs: No results for input(s): PROCALCITON, LATICACIDVEN in the last 168 hours.  No results found for this or any previous visit (from the past 240 hour(s)).       Radiology Studies: Dg Chest 2 View  Result Date: 03/18/2018 CLINICAL DATA:  Pt complains of shortness of breath and "fluid build-up" x 1 month but  symptoms getting worse; pt reports hx of DM, HTN, and right kidney transplant in 2007 and states it is failing; smoker EXAM: CHEST - 2 VIEW COMPARISON:  03/01/2018 FINDINGS: Cardiac silhouette is mildly enlarged. There is thickening of the right paratracheal stripe similar to the most recent prior exam. This may be vascular. Adenopathy is possible. Mediastinal contours otherwise unremarkable. No hilar masses or convincing adenopathy. There is linear opacity in lung bases, most prominently on the right, consistent with atelectasis. Remainder of the lungs is clear. No pleural effusion or pneumothorax. Skeletal structures are intact. IMPRESSION: 1. No  evidence of pneumonia pulmonary edema. 2. Lung base atelectasis, greater on the right. 3. Prominent right paratracheal stripe in the azygos region which could be due to adenopathy or be vascular in origin. This is similar to the most recent prior exam. 4. Mild cardiomegaly. Electronically Signed   By: Lajean Manes M.D.   On: 03/18/2018 14:17        Scheduled Meds: . calcitRIOL  1 mcg Oral Daily  . calcium acetate  1,334 mg Oral TID WC  . famotidine  10 mg Oral Daily  . furosemide  80 mg Intravenous BID  . gabapentin  400 mg Oral TID  . heparin  5,000 Units Subcutaneous Q8H  . insulin aspart  0-15 Units Subcutaneous TID WC  . insulin aspart  0-5 Units Subcutaneous QHS  . insulin glargine  25 Units Subcutaneous QHS  . metolazone  5 mg Oral Daily  . mycophenolate  720 mg Oral BID  . predniSONE  10 mg Oral Q breakfast  . simvastatin  20 mg Oral QHS  . sodium bicarbonate  650 mg Oral TID  . tacrolimus  3 mg Oral BID   Continuous Infusions:   LOS: 1 day    Time spent:35 mins. More than 50% of that time was spent in counseling and/or coordination of care.      Shelly Coss, MD [facility] Pager [phone]  If 7PM-7AM, please contact night-coverage [email or web address] Password [facility] 03/19/2018, 11:42 AM

## 2018-03-20 ENCOUNTER — Encounter (HOSPITAL_COMMUNITY)
Admission: EM | Disposition: A | Discharge status: Home / Self Care | Payer: Self-pay | Source: Home / Self Care | Attending: Internal Medicine

## 2018-03-20 ENCOUNTER — Inpatient Hospital Stay (HOSPITAL_COMMUNITY): Payer: Medicare Other | Admitting: Certified Registered Nurse Anesthetist

## 2018-03-20 ENCOUNTER — Encounter (HOSPITAL_COMMUNITY): Payer: Self-pay | Admitting: Certified Registered Nurse Anesthetist

## 2018-03-20 ENCOUNTER — Inpatient Hospital Stay (HOSPITAL_COMMUNITY): Payer: Medicare Other

## 2018-03-20 ENCOUNTER — Encounter (HOSPITAL_COMMUNITY): Payer: Medicare Other

## 2018-03-20 HISTORY — PX: INSERTION OF DIALYSIS CATHETER: SHX1324

## 2018-03-20 LAB — TYPE AND SCREEN
ABO/RH(D): A POS
Antibody Screen: NEGATIVE
Unit division: 0

## 2018-03-20 LAB — BASIC METABOLIC PANEL
Anion gap: 12 (ref 5–15)
BUN: 101 mg/dL — AB (ref 6–20)
CO2: 15 mmol/L — ABNORMAL LOW (ref 22–32)
Calcium: 5.2 mg/dL — CL (ref 8.9–10.3)
Chloride: 107 mmol/L (ref 98–111)
Creatinine, Ser: 7.17 mg/dL — ABNORMAL HIGH (ref 0.44–1.00)
GFR calc Af Amer: 8 mL/min — ABNORMAL LOW (ref >60)
GFR calc non Af Amer: 7 mL/min — ABNORMAL LOW (ref >60)
GLUCOSE: 346 mg/dL — AB (ref 70–99)
Potassium: 4.8 mmol/L (ref 3.5–5.1)
Sodium: 134 mmol/L — ABNORMAL LOW (ref 135–145)

## 2018-03-20 LAB — GLUCOSE, CAPILLARY
GLUCOSE-CAPILLARY: 231 mg/dL — AB (ref 70–99)
Glucose-Capillary: 300 mg/dL — ABNORMAL HIGH (ref 70–99)
Glucose-Capillary: 247 mg/dL — ABNORMAL HIGH (ref 70–99)
Glucose-Capillary: 243 mg/dL — ABNORMAL HIGH (ref 70–99)
Glucose-Capillary: 213 mg/dL — ABNORMAL HIGH (ref 70–99)
Glucose-Capillary: 232 mg/dL — ABNORMAL HIGH (ref 70–99)

## 2018-03-20 LAB — FERRITIN: FERRITIN: 855 ng/mL — AB (ref 11–307)

## 2018-03-20 LAB — CBC WITH DIFFERENTIAL/PLATELET
Abs Immature Granulocytes: 0.03 10*3/uL (ref 0.00–0.07)
BASOS PCT: 0 %
Basophils Absolute: 0 10*3/uL (ref 0.0–0.1)
EOS ABS: 0.3 10*3/uL (ref 0.0–0.5)
Eosinophils Relative: 4 %
HCT: 24.1 % — ABNORMAL LOW (ref 36.0–46.0)
Hemoglobin: 7.5 g/dL — ABNORMAL LOW (ref 12.0–15.0)
Immature Granulocytes: 0 %
Lymphocytes Relative: 20 %
Lymphs Abs: 1.5 10*3/uL (ref 0.7–4.0)
MCH: 28.2 pg (ref 26.0–34.0)
MCHC: 31.1 g/dL (ref 30.0–36.0)
MCV: 90.6 fL (ref 80.0–100.0)
Monocytes Absolute: 0.5 10*3/uL (ref 0.1–1.0)
Monocytes Relative: 7 %
Neutro Abs: 5.1 10*3/uL (ref 1.7–7.7)
Neutrophils Relative %: 69 %
Platelets: 169 10*3/uL (ref 150–400)
RBC: 2.66 MIL/uL — ABNORMAL LOW (ref 3.87–5.11)
RDW: 14.5 % (ref 11.5–15.5)
WBC: 7.4 10*3/uL (ref 4.0–10.5)
nRBC: 0 % (ref 0.0–0.2)

## 2018-03-20 LAB — BPAM RBC
Blood Product Expiration Date: 202001272359
ISSUE DATE / TIME: 202001051244
Unit Type and Rh: 6200

## 2018-03-20 LAB — IRON AND TIBC
Iron: 82 ug/dL (ref 28–170)
Saturation Ratios: 62 % — ABNORMAL HIGH (ref 10.4–31.8)
TIBC: 132 ug/dL — ABNORMAL LOW (ref 250–450)
UIBC: 50 ug/dL

## 2018-03-20 LAB — ALT: ALT: 11 U/L (ref 0–44)

## 2018-03-20 LAB — PHOSPHORUS: Phosphorus: 8.4 mg/dL — ABNORMAL HIGH (ref 2.5–4.6)

## 2018-03-20 SURGERY — INSERTION OF DIALYSIS CATHETER
Anesthesia: Monitor Anesthesia Care | Site: Chest

## 2018-03-20 MED ORDER — LIDOCAINE HCL (PF) 1 % IJ SOLN: 5.0000 mL | INTRAMUSCULAR | Status: DC | PRN

## 2018-03-20 MED ORDER — ACETAMINOPHEN 10 MG/ML IV SOLN: 1000.0000 mg | Freq: Once | INTRAVENOUS | Status: DC | PRN

## 2018-03-20 MED ORDER — SODIUM CHLORIDE 0.9 % IV SOLN: 100.0000 mL | INTRAVENOUS | Status: DC | PRN

## 2018-03-20 MED ORDER — SODIUM CHLORIDE 0.9 % IV SOLN
INTRAVENOUS | Status: DC | PRN
  Administered 2018-03-20: 10:00:00

## 2018-03-20 MED ORDER — FENTANYL CITRATE (PF) 100 MCG/2ML IJ SOLN
INTRAMUSCULAR | Status: AC
  Filled 2018-03-20: qty 2

## 2018-03-20 MED ORDER — CEFAZOLIN SODIUM-DEXTROSE 2-3 GM-%(50ML) IV SOLR
INTRAVENOUS | Status: DC | PRN
  Administered 2018-03-20: 2 g via INTRAVENOUS

## 2018-03-20 MED ORDER — HEPARIN SODIUM (PORCINE) 1000 UNIT/ML IJ SOLN
INTRAMUSCULAR | Status: AC
  Filled 2018-03-20: qty 4

## 2018-03-20 MED ORDER — HEPARIN SODIUM (PORCINE) 1000 UNIT/ML IJ SOLN
1000.0000 [IU] | INTRAMUSCULAR | Status: DC | PRN
  Administered 2018-03-20: 3400 [IU] via INTRAVENOUS
  Administered 2018-03-22: 1000 [IU] via INTRAVENOUS
  Filled 2018-03-20 (×3): qty 1

## 2018-03-20 MED ORDER — ALTEPLASE 2 MG IJ SOLR: 2.0000 mg | Freq: Once | INTRAMUSCULAR | Status: DC | PRN

## 2018-03-20 MED ORDER — SODIUM CHLORIDE 0.9 % IV SOLN
INTRAVENOUS | Status: AC
  Filled 2018-03-20: qty 1.2

## 2018-03-20 MED ORDER — FENTANYL CITRATE (PF) 100 MCG/2ML IJ SOLN
25.0000 ug | INTRAMUSCULAR | Status: DC | PRN
  Administered 2018-03-20: 25 ug via INTRAVENOUS

## 2018-03-20 MED ORDER — HEPARIN SODIUM (PORCINE) 1000 UNIT/ML IJ SOLN
INTRAMUSCULAR | Status: DC | PRN
  Administered 2018-03-20: 1000 [IU]

## 2018-03-20 MED ORDER — ONDANSETRON HCL 4 MG/2ML IJ SOLN
INTRAMUSCULAR | Status: DC | PRN
  Administered 2018-03-20: 4 mg via INTRAVENOUS

## 2018-03-20 MED ORDER — LIDOCAINE-EPINEPHRINE (PF) 1 %-1:200000 IJ SOLN
INTRAMUSCULAR | Status: DC | PRN
  Administered 2018-03-20: 30 mL

## 2018-03-20 MED ORDER — ACETAMINOPHEN 160 MG/5ML PO SOLN: 1000.0000 mg | Freq: Once | ORAL | Status: DC | PRN

## 2018-03-20 MED ORDER — OXYCODONE HCL 5 MG PO TABS: 5.0000 mg | ORAL_TABLET | Freq: Once | ORAL | Status: DC | PRN

## 2018-03-20 MED ORDER — ACETAMINOPHEN 500 MG PO TABS: 1000.0000 mg | ORAL_TABLET | Freq: Once | ORAL | Status: DC | PRN

## 2018-03-20 MED ORDER — HEPARIN SODIUM (PORCINE) 1000 UNIT/ML IJ SOLN
INTRAMUSCULAR | Status: AC
  Filled 2018-03-20: qty 1

## 2018-03-20 MED ORDER — SODIUM CHLORIDE 0.9 % IV SOLN
INTRAVENOUS | Status: DC
  Administered 2018-03-20: 09:00:00 via INTRAVENOUS

## 2018-03-20 MED ORDER — CEFAZOLIN SODIUM 1 G IJ SOLR
INTRAMUSCULAR | Status: AC
  Filled 2018-03-20: qty 20

## 2018-03-20 MED ORDER — 0.9 % SODIUM CHLORIDE (POUR BTL) OPTIME
TOPICAL | Status: DC | PRN
  Administered 2018-03-20: 1000 mL

## 2018-03-20 MED ORDER — OXYCODONE HCL 5 MG/5ML PO SOLN: 5.0000 mg | Freq: Once | ORAL | Status: DC | PRN

## 2018-03-20 MED ORDER — SODIUM CHLORIDE (PF) 0.9 % IJ SOLN
INTRAMUSCULAR | Status: AC
  Filled 2018-03-20: qty 10

## 2018-03-20 MED ORDER — PROPOFOL 500 MG/50ML IV EMUL
INTRAVENOUS | Status: DC | PRN
  Administered 2018-03-20: 125 ug/kg/min via INTRAVENOUS

## 2018-03-20 MED ORDER — HEPARIN SODIUM (PORCINE) 1000 UNIT/ML DIALYSIS: 1000.0000 [IU] | INTRAMUSCULAR | Status: DC | PRN

## 2018-03-20 MED ORDER — OXYCODONE-ACETAMINOPHEN 5-325 MG PO TABS
1.0000 | ORAL_TABLET | ORAL | Status: DC | PRN
  Administered 2018-03-20 – 2018-03-21 (×2): 2 via ORAL
  Administered 2018-03-21: 1 via ORAL
  Filled 2018-03-20: qty 2
  Filled 2018-03-20: qty 1
  Filled 2018-03-20: qty 2
  Filled 2018-03-20: qty 1

## 2018-03-20 MED ORDER — DARBEPOETIN ALFA 60 MCG/0.3ML IJ SOSY
PREFILLED_SYRINGE | INTRAMUSCULAR | Status: AC
  Filled 2018-03-20: qty 0.3

## 2018-03-20 MED ORDER — CALCIUM GLUCONATE-NACL 2-0.675 GM/100ML-% IV SOLN
2.0000 g | Freq: Once | INTRAVENOUS | Status: DC
  Filled 2018-03-20: qty 100

## 2018-03-20 SURGICAL SUPPLY — 40 items
BAG BANDED W/RUBBER/TAPE 36X54 (MISCELLANEOUS) ×1 IMPLANT
BAG DECANTER FOR FLEXI CONT (MISCELLANEOUS) ×2 IMPLANT
BIOPATCH RED 1 DISK 7.0 (GAUZE/BANDAGES/DRESSINGS) ×2 IMPLANT
CATH PALINDROME RT-P 15FX23CM (CATHETERS) ×1 IMPLANT
CHLORAPREP W/TINT 26ML (MISCELLANEOUS) ×2 IMPLANT
COVER DOME SNAP 22 D (MISCELLANEOUS) ×1 IMPLANT
COVER PROBE W GEL 5X96 (DRAPES) ×1 IMPLANT
COVER SURGICAL LIGHT HANDLE (MISCELLANEOUS) ×2 IMPLANT
COVER WAND RF STERILE (DRAPES) ×2 IMPLANT
DERMABOND ADVANCED (GAUZE/BANDAGES/DRESSINGS) ×1
DERMABOND ADVANCED .7 DNX12 (GAUZE/BANDAGES/DRESSINGS) IMPLANT
DRAPE C-ARM 42X72 X-RAY (DRAPES) ×2 IMPLANT
DRAPE CHEST BREAST 15X10 FENES (DRAPES) ×2 IMPLANT
GAUZE 4X4 16PLY RFD (DISPOSABLE) ×2 IMPLANT
GLOVE BIO SURGEON STRL SZ7.5 (GLOVE) ×2 IMPLANT
GLOVE BIOGEL PI IND STRL 8 (GLOVE) ×1 IMPLANT
GLOVE BIOGEL PI INDICATOR 8 (GLOVE) ×1
GLOVE ECLIPSE 7.5 STRL STRAW (GLOVE) ×1 IMPLANT
GOWN STRL REUS W/ TWL LRG LVL3 (GOWN DISPOSABLE) ×2 IMPLANT
GOWN STRL REUS W/ TWL XL LVL3 (GOWN DISPOSABLE) IMPLANT
GOWN STRL REUS W/TWL LRG LVL3 (GOWN DISPOSABLE) ×2
GOWN STRL REUS W/TWL XL LVL3 (GOWN DISPOSABLE) ×1
KIT BASIN OR (CUSTOM PROCEDURE TRAY) ×2 IMPLANT
KIT TURNOVER KIT B (KITS) ×2 IMPLANT
NDL 18GX1X1/2 (RX/OR ONLY) (NEEDLE) ×1 IMPLANT
NDL HYPO 25GX1X1/2 BEV (NEEDLE) ×1 IMPLANT
NEEDLE 18GX1X1/2 (RX/OR ONLY) (NEEDLE) ×2 IMPLANT
NEEDLE HYPO 25GX1X1/2 BEV (NEEDLE) ×2 IMPLANT
NS IRRIG 1000ML POUR BTL (IV SOLUTION) ×2 IMPLANT
PACK SURGICAL SETUP 50X90 (CUSTOM PROCEDURE TRAY) ×2 IMPLANT
PAD ARMBOARD 7.5X6 YLW CONV (MISCELLANEOUS) ×4 IMPLANT
SUT ETHILON 3 0 PS 1 (SUTURE) ×2 IMPLANT
SUT VICRYL 4-0 PS2 18IN ABS (SUTURE) ×2 IMPLANT
SYR 10ML LL (SYRINGE) ×2 IMPLANT
SYR 20CC LL (SYRINGE) ×4 IMPLANT
SYR 5ML LL (SYRINGE) ×4 IMPLANT
SYR CONTROL 10ML LL (SYRINGE) ×2 IMPLANT
TOWEL GREEN STERILE (TOWEL DISPOSABLE) ×4 IMPLANT
TOWEL GREEN STERILE FF (TOWEL DISPOSABLE) ×2 IMPLANT
WATER STERILE IRR 1000ML POUR (IV SOLUTION) ×2 IMPLANT

## 2018-03-20 NOTE — Progress Notes (Signed)
Upper extremity vein mapping completed 02/27/2018 at [facility], however due to an error the report did not cross into Epic. Results can be found below, and in [place].  03/20/2018 8:41 AM Maudry Mayhew, MHA, RVT, RDCS, RDMS

## 2018-03-20 NOTE — Anesthesia Preprocedure Evaluation (Addendum)
Anesthesia Evaluation  Patient identified by MRN, date of birth, ID band Patient awake    Reviewed: Allergy & Precautions, NPO status , Patient's Chart, lab work & pertinent test results  History of Anesthesia Complications Negative for: history of anesthetic complications  Airway Mallampati: III  TM Distance: >3 FB Neck ROM: Full    Dental  (+) Teeth Intact, Dental Advisory Given   Pulmonary neg pulmonary ROS,    breath sounds clear to auscultation       Cardiovascular hypertension, Pt. on medications and Pt. on home beta blockers  Rhythm:Regular     Neuro/Psych PSYCHIATRIC DISORDERS negative neurological ROS     GI/Hepatic   Endo/Other  diabetes, Insulin Dependent  Renal/GU CRFRenal diseaseS/p kidney transplant     Musculoskeletal   Abdominal   Peds  Hematology  (+) anemia ,   Anesthesia Other Findings   Reproductive/Obstetrics                            Anesthesia Physical Anesthesia Plan  ASA: III  Anesthesia Plan: MAC   Post-op Pain Management:    Induction: Intravenous  PONV Risk Score and Plan: 2 and Ondansetron, Treatment may vary due to age or medical condition and Propofol infusion  Airway Management Planned: Nasal Cannula  Additional Equipment: None  Intra-op Plan:   Post-operative Plan:   Informed Consent: I have reviewed the patients History and Physical, chart, labs and discussed the procedure including the risks, benefits and alternatives for the proposed anesthesia with the patient or authorized representative who has indicated his/her understanding and acceptance.   Dental advisory given  Plan Discussed with: CRNA, Surgeon and Anesthesiologist  Anesthesia Plan Comments:        Anesthesia Quick Evaluation

## 2018-03-20 NOTE — Progress Notes (Addendum)
Inpatient Diabetes Program Recommendations  AACE/ADA: New Consensus Statement on Inpatient Glycemic Control (2015)  Target Ranges:  Prepandial:   less than 140 mg/dL      Peak postprandial:   less than 180 mg/dL (1-2 hours)      Critically ill patients:  140 - 180 mg/dL   Lab Results  Component Value Date   GLUCAP 231 (H) 03/20/2018   HGBA1C 10.9 (H) 02/03/2018    Review of Glycemic Control Results for ROXI, HLAVATY (MRN [id number]) as of 03/20/2018 11:14  Ref. Range 03/19/2018 21:41 03/20/2018 08:14 03/20/2018 09:20 03/20/2018 10:59  Glucose-Capillary Latest Ref Range: 70 - 99 mg/dL 289 (H) 300 (H) 247 (H) 231 (H)   Diabetes history: DM1 per Dr Donzetta Matters Outpatient Diabetes medications: Tresiba 25 units QAM, Novolog 12-15 units 5-6 times per day Current orders for Inpatient glycemic control: Lantus 25 units QHS, Novolog 0-15 units TID with meals, Novolog 0-5 units QHS; Prednisone 10 mg QAM   Inpatient Diabetes Program Recommendations:  Consider increasing Lantus to 30 units QHS.  Additionally, when patient able to tolerate to intake, consider adding Novolog 3 units TID (assuming patient is consuming >50% of meal).   HgbA1C: A1C 10.9% on 02/03/18 indicating an average glucose of 266 mg/dl over the past 2-3 months. Prior A1C was 8.3% on 10/24/17. Patient was seen by coordinator on 02/03/18. Per note, "Patient reports that she is followed by Dr. Kasandra Knudsen for diabetes management and currently she takes Tresiba 25 units QAM and Novolog 12-15 units several times a day as an outpatient for diabetes control. Patient reports that she is taking insulin as prescribed. Patient reports that she was taking higher dose of Tresiba but Dr. Kasandra Knudsen decreased it to 25 units daily."   Thanks, Bronson Curb, MSN, RNC-OB Diabetes Coordinator [phone] (8a-5p)

## 2018-03-20 NOTE — Progress Notes (Addendum)
Patient reports never having a menstrual cycle. RN verified the presence of a signed informed consent that matches stated procedure by patient. Verified armband matches patient's stated name and birth date. Verified NPO status and that all jewelry, contact, glasses, dentures, and partials had been removed (if applicable). Fiance came a picked up one ring

## 2018-03-20 NOTE — Progress Notes (Signed)
[facility]    NEPHROLOGY PROGRESS NOTE  SUBJECTIVE:  C/o neck pain related to permacath placement.  Denies cp/sob.  No n/v.  (+)LE edema.  For HD tonight.     OBJECTIVE:  Vitals:   03/20/18 1336 03/20/18 1832  BP: (!) 131/98 (!) 134/96  Pulse:  (!) 101  Resp:  18  Temp:  97.8 F (36.6 C)  SpO2:  95%   I/O last 3 completed shifts: In: 28 [P.O.:120; Blood:315] Out: -    Genearl:  AAOx3 NAD HEENT: MMM Cyrus AT anicteric sclera Neck:  No JVD, no adenopathy CV:  Heart RRR  Lungs:  L/S CTA bilaterally Abd:  abd SNT/ND with normal BS GU:  Bladder non-palpable Extremities: (+)2 bilateral LE edema. Skin:  No skin rash  MEDICATIONS:   Current Facility-Administered Medications:  .  0.9 %  sodium chloride infusion, , Intravenous, Continuous, Angelia Mould, MD, Last Rate: 10 mL/hr at 03/20/18 0916 .  acetaminophen (TYLENOL) tablet 650 mg, 650 mg, Oral, Q6H PRN, Angelia Mould, MD, 650 mg at 03/19/18 1603 .  calcitRIOL (ROCALTROL) capsule 1 mcg, 1 mcg, Oral, Daily, Angelia Mould, MD, 1 mcg at 03/20/18 1324 .  calcium acetate (PHOSLO) capsule 1,334 mg, 1,334 mg, Oral, TID WC, Angelia Mould, MD, 1,334 mg at 03/20/18 1824 .  calcium carbonate (TUMS - dosed in mg elemental calcium) chewable tablet 800 mg of elemental calcium, 800 mg of elemental calcium, Oral, BID, Angelia Mould, MD, 800 mg of elemental calcium at 03/20/18 1323 .  calcium gluconate 2 g/ 100 mL sodium chloride IVPB, 2 g, Intravenous, Once, Angelia Mould, MD .  Chlorhexidine Gluconate Cloth 2 % PADS 6 each, 6 each, Topical, Q0600, Angelia Mould, MD .  Darbepoetin Alfa (ARANESP) injection 60 mcg, 60 mcg, Intravenous, Q Mon-HD, Angelia Mould, MD .  famotidine (PEPCID) tablet 10 mg, 10 mg, Oral, Daily, Angelia Mould, MD, 10 mg at 03/20/18 1324 .  fentaNYL (SUBLIMAZE) 100 MCG/2ML injection, , , ,  .  furosemide (LASIX) injection 80  mg, 80 mg, Intravenous, BID, Angelia Mould, MD, 80 mg at 03/20/18 1322 .  gabapentin (NEURONTIN) capsule 400 mg, 400 mg, Oral, TID, Angelia Mould, MD, 400 mg at 03/20/18 1824 .  insulin aspart (novoLOG) injection 0-15 Units, 0-15 Units, Subcutaneous, TID WC, Angelia Mould, MD, 5 Units at 03/20/18 1823 .  insulin aspart (novoLOG) injection 0-5 Units, 0-5 Units, Subcutaneous, QHS, Angelia Mould, MD, 3 Units at 03/19/18 2207 .  insulin glargine (LANTUS) injection 25 Units, 25 Units, Subcutaneous, QHS, Angelia Mould, MD, 25 Units at 03/19/18 2206 .  metoCLOPramide (REGLAN) tablet 10 mg, 10 mg, Oral, Q6H PRN, Angelia Mould, MD .  metolazone (ZAROXOLYN) tablet 5 mg, 5 mg, Oral, Daily, Angelia Mould, MD, 5 mg at 03/20/18 1653 .  mycophenolate (MYFORTIC) EC tablet 720 mg, 720 mg, Oral, BID, Angelia Mould, MD, 720 mg at 03/20/18 1653 .  oxyCODONE-acetaminophen (PERCOCET/ROXICET) 5-325 MG per tablet 1-2 tablet, 1-2 tablet, Oral, Q4H PRN, Angelia Mould, MD, 2 tablet at 03/20/18 1725 .  predniSONE (DELTASONE) tablet 10 mg, 10 mg, Oral, Q breakfast, Angelia Mould, MD, 10 mg at 03/20/18 1350 .  simvastatin (ZOCOR) tablet 20 mg, 20 mg, Oral, QHS, Angelia Mould, MD, 20 mg at 03/19/18 2206 .  sodium bicarbonate tablet 650 mg, 650 mg, Oral, TID, Angelia Mould, MD, 650 mg at 03/20/18 1824 .  tacrolimus (PROGRAF)  capsule 3 mg, 3 mg, Oral, BID, Angelia Mould, MD, 3 mg at 03/20/18 1325     LABS:  CBC Latest Ref Rng & Units 03/20/2018 03/19/2018 03/18/2018  WBC 4.0 - 10.5 K/uL 7.4 9.2 10.0  Hemoglobin 12.0 - 15.0 g/dL 7.5(L) 6.7(LL) 7.1(L)  Hematocrit 36.0 - 46.0 % 24.1(L) 22.9(L) 24.2(L)  Platelets 150 - 400 K/uL 169 185 187    CMP Latest Ref Rng & Units 03/20/2018 03/19/2018 03/18/2018  Glucose 70 - 99 mg/dL 346(H) 198(H) 184(H)  BUN 6 - 20 mg/dL 101(H) 96(H) 97(H)  Creatinine 0.44 - 1.00 mg/dL 7.17(H)  7.61(H) 7.04(H)  Sodium 135 - 145 mmol/L 134(L) 137 139  Potassium 3.5 - 5.1 mmol/L 4.8 4.1 4.5  Chloride 98 - 111 mmol/L 107 110 113(H)  CO2 22 - 32 mmol/L 15(L) 14(L) 16(L)  Calcium 8.9 - 10.3 mg/dL 5.2(LL) 4.9(LL) 5.1(LL)  Total Protein 6.5 - 8.1 g/dL - 6.2(L) -  Total Bilirubin 0.3 - 1.2 mg/dL - 0.6 -  Alkaline Phos 38 - 126 U/L - 34(L) -  AST 15 - 41 U/L - 12(L) -  ALT 0 - 44 U/L - 13 -    Lab Results  Component Value Date   CALCIUM 5.2 (LL) 03/20/2018   CAION 0.92 (L) 07/06/2017   PHOS 8.4 (H) 03/20/2018       Component Value Date/Time   COLORURINE STRAW (A) 03/01/2018 1021   APPEARANCEUR CLEAR 03/01/2018 1021   LABSPEC 1.007 03/01/2018 1021   PHURINE 5.0 03/01/2018 1021   GLUCOSEU >=500 (A) 03/01/2018 1021   HGBUR SMALL (A) 03/01/2018 1021   HGBUR negative 01/26/2010 1026   BILIRUBINUR NEGATIVE 03/01/2018 1021   BILIRUBINUR NEG 01/01/2011 1050   KETONESUR NEGATIVE 03/01/2018 1021   PROTEINUR 100 (A) 03/01/2018 1021   UROBILINOGEN 0.2 01/09/2015 1333   NITRITE NEGATIVE 03/01/2018 1021   LEUKOCYTESUR TRACE (A) 03/01/2018 1021      Component Value Date/Time   PHART 7.326 (L) 10/21/2017 0753   PCO2ART 37.2 10/21/2017 0753   PO2ART 93.0 10/21/2017 0753   HCO3 19.4 (L) 10/21/2017 0753   TCO2 21 (L) 10/21/2017 0753   ACIDBASEDEF 6.0 (H) 10/21/2017 0753   O2SAT 97.0 10/21/2017 0753       Component Value Date/Time   IRON 82 03/20/2018 0459   TIBC 132 (L) 03/20/2018 0459   FERRITIN 855 (H) 03/20/2018 0459   IRONPCTSAT 62 (H) 03/20/2018 0459       ASSESSMENT/PLAN:     Problem List Items Addressed This Visit      Genitourinary   * (Principal) Acute renal failure superimposed on stage 4 chronic kidney disease (HCC)   Renal failure     Other   KIDNEY TRANSPLANTATION, HX OF   Relevant Medications   predniSONE (DELTASONE) tablet 10 mg   mycophenolate (MYFORTIC) EC tablet 720 mg   tacrolimus (PROGRAF) capsule 3 mg    Other Visit Diagnoses    Bilateral lower  extremity edema    -  Primary   Hypervolemia, unspecified hypervolemia type       ESRD (end stage renal disease) (HCC)       Relevant Orders   DG Chest Port 1 View (Completed)     A 1. 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 S/p KT on Tac/MMF/Pred, can continue for now but will need gradual taper 3. Anemia of CKD 4. CKD-BMD, on PhosLo, C3 5. No HD access, failed old access in UEs b/l 6. Hypocalcemia, 4.9 albumin 2.6; s/p Calcium  gluconate, calcitriol increased;   P  VVS consult for AV access planning  Start Tums w/o food  Needs CLIP  HD today: 1L UF, 2.5h, Qb 250, 3.5Ca bath, 2K bath  Iron OK - on Aranesp  Check PTH  Cont PhosLo and C3 for now  Medication Issues; ? Preferred narcotic agents for pain control are hydromorphone, fentanyl, and methadone. Morphine should not be used.  ? Baclofen should be avoided ? Avoid oral sodium phosphate and magnesium citrate based laxatives / bowel preps      Gaylene Brooks, DO, FACP

## 2018-03-20 NOTE — Op Note (Signed)
03/20/2018  PREOP DIAGNOSIS: Chronic kidney disease  POSTOP DIAGNOSIS: Chronic kidney disease  PROCEDURE: Ultrasound guided placement of right IJ tunneled dialysis catheter  (23 cm)  SURGEON: Judeth Cornfield. Scot Dock, MD, FACS  ASSIST: none  ANESTHESIA: local with sedation   EBL: minimal  FINDINGS: patent right IJ  INDICATIONS: fpr HD  TECHNIQUE: The patient was taken to the operating room and sedated by anesthesia. The neck and upper chest were prepped and draped in the usual sterile fashion. After the skin was anesthetized with 1% lidocaine, and under ultrasound guidance, the right IJ was cannulated and a guidewire introduced into the superior vena cava under fluoroscopic control. The tract over the wire was dilated and then the dilator and peel-away sheath were passed over the wire and the wire and dilator removed. The catheter was passed through the peel-away sheath and positioned in the right atrium. The exit site for the catheter was selected and the skin anesthetized between the 2 areas. The catheter was then brought through the tunnel, cut to the appropriate length, and the distal ports were attached. Both ports withdrew easily, were then flushed with heparinized saline and filled with concentrated heparin. The catheter was secured at its exit site with a 3-0 nylon suture. The IJ cannulation site was closed with a 4-0 subcuticular stitch. A sterile dressing was applied. The patient tolerated the procedure well and was transferred to the recovery room in stable condition. All needle and sponge counts were correct.  Deitra Mayo, MD, FACS [facility]: 03/20/2018 DATE OF DICTATION: 03/20/2018

## 2018-03-20 NOTE — Interval H&P Note (Signed)
History and Physical Interval Note:  03/20/2018 9:39 AM  [place]  has presented today for surgery, with the diagnosis of ESRD  The various methods of treatment have been discussed with the patient and family. After consideration of risks, benefits and other options for treatment, the patient has consented to  Procedure(s): INSERTION OF TUNNELED DIALYSIS CATHETER (N/A) as a surgical intervention .  The patient's history has been reviewed, patient examined, no change in status, stable for surgery.  I have reviewed the patient's chart and labs.  Questions were answered to the patient's satisfaction.     Deitra Mayo

## 2018-03-20 NOTE — Anesthesia Procedure Notes (Addendum)
Procedure Name: MAC Date/Time: 12/26/2018 10:13 AM Performed by: Carney Living, CRNA Pre-anesthesia Checklist: Patient identified, Emergency Drugs available, Suction available, Patient being monitored and Timeout performed Patient Re-evaluated:Patient Re-evaluated prior to induction Oxygen Delivery Method: Simple face mask

## 2018-03-20 NOTE — Progress Notes (Signed)
PROGRESS NOTE    [name]  TDD:[id number] DOB: [birth year] DOA: 03/18/2018 PCP: [name], MD   Brief Narrative:  [name] is a 33 y.o. 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  Patient has been admitted here numerous times in the past.  Her last admission was in December when she presented with similar complaints.  She was diuresed with IV Lasix and was discharged to home. Patient was admitted for the management of end-stage renal disease.  Nephrology consulted.   She underwent right IJ tunneled dialysis catheter on 03/20/2018 by vascular surgery.  Dialysis also will be started on 03/20/2018.  Assessment & Plan:   Principal Problem:   Acute renal failure superimposed on stage 4 chronic kidney disease (HCC) Active Problems:   OBESITY   Anemia due to chronic kidney disease   Essential hypertension, benign   Secondary renal hyperparathyroidism (HCC)   KIDNEY TRANSPLANTATION, HX OF   Type 1 diabetes mellitus with complication, uncontrolled (HCC)   Gastroparesis   Hypocalcemia   Renal failure   Acute kidney injury on CKD stage V: Her kidney function has gradually worsened since last few months.  She follows with nephrology as an outpatient,Dr Lorrene Reid.  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 She was on dialysed from age 8 to age 84 . Nephrology following. Started dialysis on 03/20/18. She underwent right IJ tunneled dialysis catheter on 03/20/2018 by vascular surgery.  Anemia of chronic disease: Transfused with 1 unit of PRBC. Hb of 7.5 today.  Continue to monitor CBC.  She  has been transfused with Procrit in the past.  Status post kidney transplant: Underwent renal transplant in 2007.  She had failed AV graft on bilateral upper extremities in the past.  She follows with the kidney transplant team at transplant center in [place].  She is on mycophenolate, prednisone and tacrolimus at home which we will continue for now.  Most recent biopsy on 7/19 showed borderline cellular rejection on the background of transplant.  Diabetes mellitus type 1: On insulin.  Continue sliding scale and long acting  insulin.  Last hemoglobin A1c of 10.9 as per 01/2018. Continue to monitor blood sugars.  Bilateral lower extremity pain/peripheral neuropathy: Most likely her neuropathy is from her diabetes which is contributing to the bilateral lower extremity pain.  Continue gabapentin.  Needs glycemic control.  Hypocalcemia: She has  secondary hyperparathyroidism.  Will supplement with calcium.  Continue calcitriol.  Phosphorous of 8.4.  Continue PhosLo  History of diabetic gastroparesis: Denies any abdominal pain at present.  Continue Reglan as needed.  Hypertension: Currently blood pressure stable.  Continue Coreg  Anion gap metabolic acidosis: Due to renal failure.  Continue sodium bicarb tablets.  Chronic pain syndrome: Still complains of pain in bilateral lower extremities.  She has history of chronic opiate use.  History of XXX syndrome/pseudoseizures/intellectual impairment: Continue supportive care         DVT prophylaxis:SCD Code Status: Full Family Communication: Friend present at the bedside Disposition Plan: Home after finalization of outpatient dialysis facility  Consultants: Nephrology  Procedures: None  Antimicrobials: None  Subjective: Patient seen and examined the bedside this morning.  She looked comfortable.  Not in respiratory distress.  Denies any chest pain.  Objective: Vitals:   03/20/18 1145 03/20/18 1200 03/20/18 1215 03/20/18 1224    BP: (!) 132/106 (!) 114/92 (!) 126/97 127/90  Pulse: 98 96 96   Resp: 13 20 12    Temp:    97.7 F (36.5 C)  TempSrc:      SpO2: 97% 99% 99%   Weight:      Height:        Intake/Output Summary (Last 24 hours) at 03/20/2018 1239 Last data filed at 03/20/2018 1227 Gross per 24 hour  Intake 995 ml  Output 0 ml  Net 995 ml   Filed Weights   03/18/18 1228  Weight: 86.6 kg    Examination:  General exam: Not in distress,average built HEENT:PERRL,Oral mucosa moist, Ear/Nose normal on gross exam Respiratory system: Trace basal crackles, otherwise clear Cardiovascular system: S1 & S2 heard, RRR. No JVD, murmurs, rubs, gallops or clicks. Pedal edema. Gastrointestinal system: Abdomen is nondistended, soft and nontender. No organomegaly or masses felt. Normal bowel sounds heard. Central nervous system: Alert and oriented. No focal neurological deficits. Extremities: 1-2+ bilateral lower extremity edema, no clubbing ,no cyanosis, distal peripheral pulses palpable. Skin: No rashes, lesions or ulcers,no icterus ,no pallor MSK: Normal muscle bulk,tone ,power Psychiatry: Judgement and insight appear normal. Mood & affect appropriate.     Data Reviewed: I have personally reviewed following labs and imaging studies  CBC: Recent Labs  Lab 03/18/18 1313 03/19/18 0443 03/20/18 0459  WBC 10.0 9.2 7.4  NEUTROABS 6.0  --  5.1  HGB 7.1* 6.7* 7.5*  HCT 24.2* 22.9* 24.1*  MCV 96.4 95.8 90.6  PLT 187 185 542   Basic Metabolic Panel: Recent Labs  Lab 03/18/18 1313 03/19/18 0443 03/19/18 0454 03/20/18 0459  NA 139 137  --  134*  K 4.5 4.1  --  4.8  CL 113* 110  --  107  CO2 16* 14*  --  15*  GLUCOSE 184* 198*  --  346*  BUN 97* 96*  --  101*  CREATININE 7.04* 7.61*  --  7.17*  CALCIUM 5.1* 4.9*  --  5.2*  PHOS  --   --  8.3* 8.4*   GFR: Estimated Creatinine Clearance: 12.3 mL/min (A) (by C-G formula based on SCr of 7.17 mg/dL (H)). Liver Function Tests: Recent Labs  Lab  03/19/18 0443  AST 12*  ALT 13  ALKPHOS 34*  BILITOT 0.6  PROT 6.2*  ALBUMIN 2.6*   No results for input(s): LIPASE, AMYLASE in the last 168 hours. No results for input(s): AMMONIA in the last 168 hours. Coagulation Profile: No results for input(s): INR, PROTIME in the last 168 hours. Cardiac Enzymes: No results for input(s): CKTOTAL, CKMB, CKMBINDEX, TROPONINI in the last 168 hours. BNP (last 3 results) No results for input(s): PROBNP in the last 8760 hours. HbA1C: No results for input(s): HGBA1C in the last 72 hours. CBG: Recent Labs  Lab 03/19/18 1739 03/19/18 2141 03/20/18 0814 03/20/18 0920 03/20/18 1059  GLUCAP 285* 289* 300* 247* 231*   Lipid Profile: No results for input(s): CHOL, HDL, LDLCALC, TRIG, CHOLHDL, LDLDIRECT in the last 72 hours. Thyroid Function Tests: No results for input(s): TSH, T4TOTAL, FREET4, T3FREE, THYROIDAB in the last 72 hours. Anemia Panel: Recent Labs    03/20/18 0459  FERRITIN 855*  TIBC 132*  IRON 82   Sepsis Labs: No results for input(s): PROCALCITON, LATICACIDVEN in the last 168 hours.  No results found for this  or any previous visit (from the past 240 hour(s)).       Radiology Studies: Dg Chest 2 View  Result Date: 03/18/2018 CLINICAL DATA:  Pt complains of shortness of breath and "fluid build-up" x 1 month but symptoms getting worse; pt reports hx of DM, HTN, and right kidney transplant in 2007 and states it is failing; smoker EXAM: CHEST - 2 VIEW COMPARISON:  03/01/2018 FINDINGS: Cardiac silhouette is mildly enlarged. There is thickening of the right paratracheal stripe similar to the most recent prior exam. This may be vascular. Adenopathy is possible. Mediastinal contours otherwise unremarkable. No hilar masses or convincing adenopathy. There is linear opacity in lung bases, most prominently on the right, consistent with atelectasis. Remainder of the lungs is clear. No pleural effusion or pneumothorax. Skeletal structures  are intact. IMPRESSION: 1. No evidence of pneumonia pulmonary edema. 2. Lung base atelectasis, greater on the right. 3. Prominent right paratracheal stripe in the azygos region which could be due to adenopathy or be vascular in origin. This is similar to the most recent prior exam. 4. Mild cardiomegaly. Electronically Signed   By: Lajean Manes M.D.   On: 03/18/2018 14:17   Dg Chest Port 1 View  Result Date: 03/20/2018 CLINICAL DATA:  End-stage renal disease, diatek placement EXAM: PORTABLE CHEST 1 VIEW COMPARISON:  03/18/2018 FINDINGS: Bilateral lower lung scarring/atelectasis. No focal consolidation. No frank interstitial edema. No pleural effusion or pneumothorax. The heart is top-normal in size. Right IJ dual lumen dialysis catheter terminating in the cavoatrial junction. IMPRESSION: Right IJ dual lumen dialysis catheter terminating in the cavoatrial junction. Electronically Signed   By: Julian Hy M.D.   On: 03/20/2018 11:44        Scheduled Meds: . [MAR Hold] calcitRIOL  1 mcg Oral Daily  . [MAR Hold] calcium acetate  1,334 mg Oral TID WC  . [MAR Hold] calcium carbonate  800 mg of elemental calcium Oral BID  . [MAR Hold] Chlorhexidine Gluconate Cloth  6 each Topical Q0600  . [MAR Hold] darbepoetin (ARANESP) injection - DIALYSIS  60 mcg Intravenous Q Mon-HD  . [MAR Hold] famotidine  10 mg Oral Daily  . fentaNYL      . [MAR Hold] furosemide  80 mg Intravenous BID  . [MAR Hold] gabapentin  400 mg Oral TID  . [MAR Hold] insulin aspart  0-15 Units Subcutaneous TID WC  . [MAR Hold] insulin aspart  0-5 Units Subcutaneous QHS  . [MAR Hold] insulin glargine  25 Units Subcutaneous QHS  . [MAR Hold] metolazone  5 mg Oral Daily  . [MAR Hold] mycophenolate  720 mg Oral BID  . [MAR Hold] predniSONE  10 mg Oral Q breakfast  . [MAR Hold] simvastatin  20 mg Oral QHS  . [MAR Hold] sodium bicarbonate  650 mg Oral TID  . [MAR Hold] tacrolimus  3 mg Oral BID   Continuous Infusions: . sodium  chloride 10 mL/hr at 03/20/18 0916  . acetaminophen    . [MAR Hold] calcium gluconate       LOS: 2 days    Time spent:35 mins. More than 50% of that time was spent in counseling and/or coordination of care.      Shelly Coss, MD [facility] Pager [phone]  If 7PM-7AM, please contact night-coverage [email or web address] Password [facility] 03/20/2018, 12:39 PM

## 2018-03-20 NOTE — Transfer of Care (Signed)
Immediate Anesthesia Transfer of Care Note  Patient: [name]  Procedure(s) Performed: INSERTION OF TUNNELED DIALYSIS CATHETER - RIGHT INTERANL JUGULAR PLACEMENT (N/A Chest)  Patient Location: PACU  Anesthesia Type:MAC  Level of Consciousness: awake, alert , oriented and patient cooperative  Airway & Oxygen Therapy: Patient Spontanous Breathing and Patient connected to nasal cannula oxygen  Post-op Assessment: Report given to RN, Post -op Vital signs reviewed and stable and Patient moving all extremities X 4  Post vital signs: Reviewed and stable  Last Vitals:  Vitals Value Taken Time  BP 104/80 03/20/2018 10:56 AM  Temp    Pulse 94 03/20/2018 10:57 AM  Resp 15 03/20/2018 10:57 AM  SpO2 97 % 03/20/2018 10:57 AM  Vitals shown include unvalidated device data.  Last Pain:  Vitals:   03/20/18 0820  TempSrc:   PainSc: 6       Patients Stated Pain Goal: 0 (90/30/09 2330)  Complications: No apparent anesthesia complications

## 2018-03-21 ENCOUNTER — Encounter (HOSPITAL_COMMUNITY): Payer: Self-pay | Admitting: Vascular Surgery

## 2018-03-21 LAB — BASIC METABOLIC PANEL
Anion gap: 12 (ref 5–15)
BUN: 62 mg/dL — ABNORMAL HIGH (ref 6–20)
CO2: 24 mmol/L (ref 22–32)
CREATININE: 5.28 mg/dL — AB (ref 0.44–1.00)
Calcium: 8.2 mg/dL — ABNORMAL LOW (ref 8.9–10.3)
Chloride: 102 mmol/L (ref 98–111)
GFR calc non Af Amer: 10 mL/min — ABNORMAL LOW (ref >60)
GFR, EST AFRICAN AMERICAN: 11 mL/min — AB (ref >60)
Glucose, Bld: 391 mg/dL — ABNORMAL HIGH (ref 70–99)
Potassium: 3.9 mmol/L (ref 3.5–5.1)
Sodium: 138 mmol/L (ref 135–145)

## 2018-03-21 LAB — PTH, INTACT AND CALCIUM
CALCIUM TOTAL (PTH): 5.7 mg/dL — AB (ref 8.7–10.2)
PTH: 17 pg/mL (ref 15–65)

## 2018-03-21 LAB — GLUCOSE, CAPILLARY
Glucose-Capillary: 406 mg/dL — ABNORMAL HIGH (ref 70–99)
Glucose-Capillary: 411 mg/dL — ABNORMAL HIGH (ref 70–99)
Glucose-Capillary: 363 mg/dL — ABNORMAL HIGH (ref 70–99)
Glucose-Capillary: 400 mg/dL — ABNORMAL HIGH (ref 70–99)
Glucose-Capillary: 291 mg/dL — ABNORMAL HIGH (ref 70–99)
Glucose-Capillary: 317 mg/dL — ABNORMAL HIGH (ref 70–99)

## 2018-03-21 LAB — VITAMIN D 25 HYDROXY (VIT D DEFICIENCY, FRACTURES): Vit D, 25-Hydroxy: 8.9 ng/mL — ABNORMAL LOW (ref 30.0–100.0)

## 2018-03-21 LAB — HEPATITIS B SURFACE ANTIBODY,QUALITATIVE: Hep B S Ab: REACTIVE

## 2018-03-21 LAB — HEPATITIS B CORE ANTIBODY, IGM: Hep B C IgM: NEGATIVE

## 2018-03-21 LAB — HEPATITIS B SURFACE ANTIGEN: Hepatitis B Surface Ag: NEGATIVE

## 2018-03-21 MED ORDER — INSULIN ASPART 100 UNIT/ML ~~LOC~~ SOLN
5.0000 [IU] | Freq: Three times a day (TID) | SUBCUTANEOUS | Status: DC
  Administered 2018-03-21 (×2): 5 [IU] via SUBCUTANEOUS

## 2018-03-21 MED ORDER — CARVEDILOL 12.5 MG PO TABS
12.5000 mg | ORAL_TABLET | Freq: Two times a day (BID) | ORAL | Status: DC
  Administered 2018-03-21 – 2018-03-24 (×5): 12.5 mg via ORAL
  Filled 2018-03-21 (×7): qty 1

## 2018-03-21 MED ORDER — SODIUM BICARBONATE 650 MG PO TABS
650.0000 mg | ORAL_TABLET | Freq: Two times a day (BID) | ORAL | Status: DC
  Administered 2018-03-21 – 2018-03-24 (×7): 650 mg via ORAL
  Filled 2018-03-21 (×7): qty 1

## 2018-03-21 MED ORDER — INSULIN ASPART 100 UNIT/ML ~~LOC~~ SOLN
10.0000 [IU] | Freq: Once | SUBCUTANEOUS | Status: AC
  Administered 2018-03-21: 10 [IU] via INTRAVENOUS

## 2018-03-21 MED ORDER — INSULIN GLARGINE 100 UNIT/ML ~~LOC~~ SOLN
50.0000 [IU] | Freq: Every day | SUBCUTANEOUS | Status: DC
  Administered 2018-03-21: 50 [IU] via SUBCUTANEOUS
  Filled 2018-03-21 (×2): qty 0.5

## 2018-03-21 NOTE — Progress Notes (Signed)
[facility] Progress Note    Assessment/ Plan:   32y/o DM ESRD FSGS s/p transplant, intellectual impairment p/w dyspnea and LE edema -> new start iHD  1.  New ESRD after failed KT  - Still being CLIP'd; no center yet - Plan on HD Wed - S/p KT on Tac/MMF/Pred, can continue for now but will need gradual taper - No HD access, failed old access in UEs b/l - Appreciate Dr. Scot Dock placing the RIJ TC; will see if a permanent access is possible as well  2.  Anemia of CKD - Not iron deficient - Transfuse as needed - ESA 20mcg on 1/6  3.  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 2 tabs TIDM - Recheck phos - D/C TUMS - Continue phoslo for now   Subjective:   C/o pain in the right chest/shoulder and neck region, describes it as 8/10 O/w denies f/c/n/v/dyspnea   Objective:   BP (!) 168/100   Pulse (!) 107   Temp (!) 97.2 F (36.2 C) (Oral)   Resp 18   Ht 5\' 6"  (1.676 m)   Wt 89.3 kg   SpO2 92%   BMI 31.78 kg/m   Intake/Output Summary (Last 24 hours) at 03/21/2018 7628 Last data filed at 03/20/2018 2140 Gross per 24 hour  Intake 560 ml  Output 1000 ml  Net -440 ml   Weight change:   Physical Exam: Genearl:  AAOx3 NAD HEENT: MMM [place] AT anicteric sclera CV:  Heart RRR  Lungs:  L/S CTA bilaterally Abd:  abd SNT/ND with normal BS GU:  Bladder non-palpable Extremities: (+)1-2 bilateral LE edema. Access: RIJ TC  Imaging: Dg Chest Port 1 View  Result Date: 03/20/2018 CLINICAL DATA:  End-stage renal disease, diatek placement EXAM: PORTABLE CHEST 1 VIEW COMPARISON:  03/18/2018 FINDINGS: Bilateral lower lung scarring/atelectasis. No focal consolidation. No frank interstitial edema. No pleural effusion or pneumothorax. The heart is top-normal in size. Right IJ dual lumen dialysis catheter terminating in the cavoatrial junction. IMPRESSION: Right IJ dual lumen dialysis catheter terminating in the cavoatrial junction. Electronically Signed   By: Julian Hy M.D.   On:  03/20/2018 11:44    Labs: BMET Recent Labs  Lab 03/18/18 1313 03/19/18 0443 03/19/18 0454 03/20/18 0459 03/21/18 0523  NA 139 137  --  134* 138  K 4.5 4.1  --  4.8 3.9  CL 113* 110  --  107 102  CO2 16* 14*  --  15* 24  GLUCOSE 184* 198*  --  346* 391*  BUN 97* 96*  --  101* 62*  CREATININE 7.04* 7.61*  --  7.17* 5.28*  CALCIUM 5.1* 4.9*  --  5.2* 8.2*  PHOS  --   --  8.3* 8.4*  --    CBC Recent Labs  Lab 03/18/18 1313 03/19/18 0443 03/20/18 0459  WBC 10.0 9.2 7.4  NEUTROABS 6.0  --  5.1  HGB 7.1* 6.7* 7.5*  HCT 24.2* 22.9* 24.1*  MCV 96.4 95.8 90.6  PLT 187 185 169    Medications:    . calcitRIOL  1 mcg Oral Daily  . calcium acetate  1,334 mg Oral TID WC  . calcium carbonate  800 mg of elemental calcium Oral BID  . Chlorhexidine Gluconate Cloth  6 each Topical Q0600  . darbepoetin (ARANESP) injection - DIALYSIS  60 mcg Intravenous Q Mon-HD  . famotidine  10 mg Oral Daily  . furosemide  80 mg Intravenous BID  . gabapentin  400 mg Oral  TID  . insulin aspart  0-15 Units Subcutaneous TID WC  . insulin aspart  0-5 Units Subcutaneous QHS  . insulin glargine  25 Units Subcutaneous QHS  . mycophenolate  720 mg Oral BID  . predniSONE  10 mg Oral Q breakfast  . simvastatin  20 mg Oral QHS  . sodium bicarbonate  650 mg Oral TID  . tacrolimus  3 mg Oral BID      Otelia Santee, MD 03/21/2018, 7:52 AM

## 2018-03-21 NOTE — Anesthesia Postprocedure Evaluation (Signed)
Anesthesia Post Note  Patient: [name]  Procedure(s) Performed: INSERTION OF TUNNELED DIALYSIS CATHETER - RIGHT INTERANL JUGULAR PLACEMENT (N/A Chest)     Patient location during evaluation: PACU Anesthesia Type: MAC Level of consciousness: awake and alert Pain management: pain level controlled Vital Signs Assessment: post-procedure vital signs reviewed and stable Respiratory status: spontaneous breathing, nonlabored ventilation, respiratory function stable and patient connected to nasal cannula oxygen Cardiovascular status: stable and blood pressure returned to baseline Postop Assessment: no apparent nausea or vomiting Anesthetic complications: no    Last Vitals:  Vitals:   03/21/18 0438 03/21/18 0439  BP: (!) 158/110 (!) 168/100  Pulse: (!) 106 (!) 107  Resp: 18   Temp: (!) 36.2 C   SpO2: 94% 92%    Last Pain:  Vitals:   03/21/18 0800  TempSrc:   PainSc: 8                  Kawana Hegel

## 2018-03-21 NOTE — Progress Notes (Signed)
MD Adhikari aware of critical high BG and elevated BP this morning. Adjusted insulin medication, ordered a 1X IV dose, and restarted patient on Coreg. Continuing to monitor throughout shift.

## 2018-03-21 NOTE — Progress Notes (Signed)
PROGRESS NOTE    [name]  LYY:[id number] DOB: 1985/06/04 DOA: 03/18/2018 PCP: [name], MD   Brief Narrative:  [name] is a 33 y.o. 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  Patient has been admitted here numerous times in the past.  Her last admission was in December when she presented with similar complaints.  She was diuresed with IV Lasix and was discharged to home. Patient was admitted for the management of end-stage renal disease.  Nephrology consulted.   She underwent right IJ tunneled dialysis catheter on 03/20/2018 by vascular surgery.  Dialysis  started on 03/20/2018.  Now Awaiting further dialysis sessions and finalizing for outpatient dialysis facility.  Assessment & Plan:   Principal Problem:   Acute renal failure superimposed on stage 4 chronic kidney disease (HCC) Active Problems:   OBESITY   Anemia due to chronic kidney disease   Essential hypertension, benign   Secondary renal hyperparathyroidism (HCC)   KIDNEY TRANSPLANTATION, HX OF   Type 1 diabetes mellitus with complication, uncontrolled (HCC)   Gastroparesis   Hypocalcemia   Renal failure   Acute kidney injury on CKD stage V: Her kidney function has gradually worsened since last few months.  She follows with nephrology as an outpatient,Dr Lorrene Reid.  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 She was on dialysed from age 1 to age 6 . Nephrology following. Started dialysis on 03/20/18. She underwent right IJ tunneled dialysis catheter on 03/20/2018 by vascular surgery.  Vascular surgery also following  for AV access planning. Next HD will be tomorrow.  Anemia of chronic disease: Transfused with 1 unit of PRBC. Last Hb of 7.5 .  Continue to monitor CBC.  She has been transfused with Procrit in the past.  Status post kidney transplant: Underwent renal transplant in 2007.  She had failed AV graft on bilateral upper extremities in the past.  She follows with the kidney transplant team at transplant center in [place].  She was on mycophenolate, prednisone and tacrolimus at home which we will continue for now.  Most recent biopsy on 7/19 showed borderline cellular rejection on the background of transplant.  Diabetes mellitus type 1: On insulin.  Continue sliding scale and long acting  insulin.  Last hemoglobin A1c of 10.9 as per 01/2018. Continue to monitor blood sugars. Note to be hyperglycemic this morning.  Will increase insulin dose.  Bilateral lower extremity pain/peripheral neuropathy: Most likely her neuropathy is from her diabetes which is contributing to the bilateral lower extremity pain.  Continue gabapentin.  Needs glycemic control.  Hypocalcemia: She has  secondary hyperparathyroidism.  Supplemented with calcium.  Continue calcitriol.  Phosphorous of 8.4.  Continue PhosLo  History of diabetic gastroparesis: Denies any abdominal pain at present.  Continue Reglan as needed.  Hypertension: Hypertensive this morning.  Continue Coreg  Anion gap metabolic acidosis: Due to renal failure.  Continue sodium bicarb tablets.  Chronic pain syndrome: Chronic  bilateral lower extremities pain.  She has history of chronic opiate use.  History of XXX syndrome/pseudoseizures/intellectual impairment: Continue supportive care         DVT prophylaxis:SCD Code Status: Full Family Communication: None present at the bedside  Disposition Plan: Home after finalization of outpatient dialysis facility ,end of inpatient dialysis sessions, clearance from nephrology  Consultants:  Nephrology  Procedures: None  Antimicrobials: None  Subjective: Patient seen and examined the bedside this morning.  Comfortable this morning.  Denies any pain.  She was eager to go home and asking  when she can go home.  Objective: Vitals:   03/20/18 2140 03/20/18 2201 03/21/18 0438 03/21/18 0439  BP: (!) 154/101 (!) 157/98 (!) 158/110 (!) 168/100  Pulse: 96 98 (!) 106 (!) 107  Resp: 12 16 18    Temp: 97.7 F (36.5 C)  (!) 97.2 F (36.2 C)   TempSrc: Oral Oral Oral   SpO2: 97% 95% 94% 92%  Weight: 89.3 kg     Height:        Intake/Output Summary (Last 24 hours) at 03/21/2018 1147 Last data filed at 03/20/2018 2140 Gross per 24 hour  Intake 160 ml  Output 1000 ml  Net -840 ml   Filed Weights   03/18/18 1228 03/20/18 1900 03/20/18 2140  Weight: 86.6 kg 90.6 kg 89.3 kg    Examination:  General exam: Not in distress,average built HEENT:PERRL,Oral mucosa moist, Ear/Nose normal on gross exam Respiratory system: Bilaterally clear Cardiovascular system: S1 & S2 heard, RRR. No JVD, murmurs, rubs, gallops or clicks. Pedal edema. Tunneled catheter on the right chest Gastrointestinal system: Abdomen is nondistended, soft and nontender. No organomegaly or masses felt. Normal bowel sounds heard. Central nervous system: Alert and oriented. No focal neurological deficits. Extremities: 1-2+ bilateral lower extremity edema, no clubbing ,no cyanosis, distal peripheral pulses palpable. Skin: No rashes, lesions or ulcers,no icterus ,no pallor MSK: Normal muscle bulk,tone ,power Psychiatry: Judgement and insight appear normal. Mood & affect appropriate.     Data Reviewed: I have personally reviewed following labs and imaging studies  CBC: Recent Labs  Lab 03/18/18 1313 03/19/18 0443 03/20/18 0459  WBC 10.0 9.2 7.4  NEUTROABS 6.0  --  5.1  HGB 7.1* 6.7* 7.5*  HCT 24.2* 22.9* 24.1*  MCV 96.4 95.8 90.6  PLT 187 185 938   Basic Metabolic Panel: Recent Labs  Lab 03/18/18 1313  03/19/18 0443 03/19/18 0454 03/20/18 0459 03/20/18 1915 03/21/18 0523  NA 139 137  --  134*  --  138  K 4.5 4.1  --  4.8  --  3.9  CL 113* 110  --  107  --  102  CO2 16* 14*  --  15*  --  24  GLUCOSE 184* 198*  --  346*  --  391*  BUN 97* 96*  --  101*  --  62*  CREATININE 7.04* 7.61*  --  7.17*  --  5.28*  CALCIUM 5.1* 4.9*  --  5.2* 5.7* 8.2*  PHOS  --   --  8.3* 8.4*  --   --    GFR: Estimated Creatinine Clearance: 16.9 mL/min (A) (by C-G formula based on SCr of 5.28 mg/dL (H)). Liver Function Tests: Recent Labs  Lab 03/19/18 0443 03/20/18 1915  AST 12*  --   ALT 13 11  ALKPHOS 34*  --   BILITOT 0.6  --   PROT 6.2*  --   ALBUMIN 2.6*  --    No results for input(s): LIPASE, AMYLASE in the last 168 hours. No results for input(s): AMMONIA in the last 168 hours. Coagulation Profile: No results for input(s): INR, PROTIME in the last 168 hours. Cardiac Enzymes: No results for input(s): CKTOTAL, CKMB, CKMBINDEX, TROPONINI in  the last 168 hours. BNP (last 3 results) No results for input(s): PROBNP in the last 8760 hours. HbA1C: No results for input(s): HGBA1C in the last 72 hours. CBG: Recent Labs  Lab 03/20/18 1722 03/20/18 2213 03/21/18 0901 03/21/18 0903 03/21/18 1055  GLUCAP 213* 232* 411* 406* 363*   Lipid Profile: No results for input(s): CHOL, HDL, LDLCALC, TRIG, CHOLHDL, LDLDIRECT in the last 72 hours. Thyroid Function Tests: No results for input(s): TSH, T4TOTAL, FREET4, T3FREE, THYROIDAB in the last 72 hours. Anemia Panel: Recent Labs    03/20/18 0459  FERRITIN 855*  TIBC 132*  IRON 82   Sepsis Labs: No results for input(s): PROCALCITON, LATICACIDVEN in the last 168 hours.  No results found for this or any previous visit (from the past 240 hour(s)).       Radiology Studies: Dg Chest Port 1 View  Result Date: 03/20/2018 CLINICAL DATA:  End-stage renal disease, diatek placement EXAM: PORTABLE CHEST 1 VIEW COMPARISON:  03/18/2018 FINDINGS:  Bilateral lower lung scarring/atelectasis. No focal consolidation. No frank interstitial edema. No pleural effusion or pneumothorax. The heart is top-normal in size. Right IJ dual lumen dialysis catheter terminating in the cavoatrial junction. IMPRESSION: Right IJ dual lumen dialysis catheter terminating in the cavoatrial junction. Electronically Signed   By: Julian Hy M.D.   On: 03/20/2018 11:44        Scheduled Meds: . calcitRIOL  1 mcg Oral Daily  . calcium acetate  1,334 mg Oral TID WC  . Chlorhexidine Gluconate Cloth  6 each Topical Q0600  . darbepoetin (ARANESP) injection - DIALYSIS  60 mcg Intravenous Q Mon-HD  . famotidine  10 mg Oral Daily  . furosemide  80 mg Intravenous BID  . gabapentin  400 mg Oral TID  . insulin aspart  0-15 Units Subcutaneous TID WC  . insulin aspart  0-5 Units Subcutaneous QHS  . insulin aspart  5 Units Subcutaneous TID WC  . insulin glargine  50 Units Subcutaneous QHS  . mycophenolate  720 mg Oral BID  . predniSONE  10 mg Oral Q breakfast  . simvastatin  20 mg Oral QHS  . sodium bicarbonate  650 mg Oral BID  . tacrolimus  3 mg Oral BID   Continuous Infusions: . sodium chloride Stopped (03/21/18 0405)  . calcium gluconate       LOS: 3 days    Time spent:25 mins. More than 50% of that time was spent in counseling and/or coordination of care.      Shelly Coss, MD [facility] Pager [phone]  If 7PM-7AM, please contact night-coverage [email or web address] Password [facility] 03/21/2018, 11:47 AM

## 2018-03-22 ENCOUNTER — Other Ambulatory Visit: Payer: Self-pay

## 2018-03-22 LAB — BASIC METABOLIC PANEL
Anion gap: 10 (ref 5–15)
BUN: 69 mg/dL — ABNORMAL HIGH (ref 6–20)
CALCIUM: 8 mg/dL — AB (ref 8.9–10.3)
CO2: 25 mmol/L (ref 22–32)
Chloride: 102 mmol/L (ref 98–111)
Creatinine, Ser: 5.72 mg/dL — ABNORMAL HIGH (ref 0.44–1.00)
GFR calc Af Amer: 10 mL/min — ABNORMAL LOW (ref >60)
GFR calc non Af Amer: 9 mL/min — ABNORMAL LOW (ref >60)
Glucose, Bld: 313 mg/dL — ABNORMAL HIGH (ref 70–99)
Potassium: 3.7 mmol/L (ref 3.5–5.1)
SODIUM: 137 mmol/L (ref 135–145)

## 2018-03-22 LAB — CBC
HEMATOCRIT: 26.9 % — AB (ref 36.0–46.0)
Hemoglobin: 8.5 g/dL — ABNORMAL LOW (ref 12.0–15.0)
MCH: 29.2 pg (ref 26.0–34.0)
MCHC: 31.6 g/dL (ref 30.0–36.0)
MCV: 92.4 fL (ref 80.0–100.0)
Platelets: 204 10*3/uL (ref 150–400)
RBC: 2.91 MIL/uL — ABNORMAL LOW (ref 3.87–5.11)
RDW: 14.2 % (ref 11.5–15.5)
WBC: 7.8 10*3/uL (ref 4.0–10.5)
nRBC: 0 % (ref 0.0–0.2)

## 2018-03-22 LAB — GLUCOSE, CAPILLARY
GLUCOSE-CAPILLARY: 267 mg/dL — AB (ref 70–99)
Glucose-Capillary: 282 mg/dL — ABNORMAL HIGH (ref 70–99)
Glucose-Capillary: 161 mg/dL — ABNORMAL HIGH (ref 70–99)
Glucose-Capillary: 343 mg/dL — ABNORMAL HIGH (ref 70–99)

## 2018-03-22 LAB — PHOSPHORUS: Phosphorus: 5.7 mg/dL — ABNORMAL HIGH (ref 2.5–4.6)

## 2018-03-22 MED ORDER — LIDOCAINE-PRILOCAINE 2.5-2.5 % EX CREA: 1.0000 "application " | TOPICAL_CREAM | CUTANEOUS | Status: DC | PRN

## 2018-03-22 MED ORDER — PENTAFLUOROPROP-TETRAFLUOROETH EX AERO: 1.0000 "application " | INHALATION_SPRAY | CUTANEOUS | Status: DC | PRN

## 2018-03-22 MED ORDER — INSULIN ASPART 100 UNIT/ML ~~LOC~~ SOLN
8.0000 [IU] | Freq: Three times a day (TID) | SUBCUTANEOUS | Status: DC
  Administered 2018-03-22 (×2): 8 [IU] via SUBCUTANEOUS

## 2018-03-22 MED ORDER — CALCIUM ACETATE (PHOS BINDER) 667 MG PO CAPS
2001.0000 mg | ORAL_CAPSULE | Freq: Three times a day (TID) | ORAL | Status: DC
  Administered 2018-03-22 – 2018-03-24 (×6): 2001 mg via ORAL
  Filled 2018-03-22 (×6): qty 3

## 2018-03-22 MED ORDER — INSULIN GLARGINE 100 UNIT/ML ~~LOC~~ SOLN
60.0000 [IU] | Freq: Every day | SUBCUTANEOUS | Status: DC
  Administered 2018-03-22: 60 [IU] via SUBCUTANEOUS
  Filled 2018-03-22: qty 0.6

## 2018-03-22 MED ORDER — GABAPENTIN 300 MG PO CAPS
300.0000 mg | ORAL_CAPSULE | Freq: Two times a day (BID) | ORAL | Status: DC
  Administered 2018-03-22 – 2018-03-24 (×4): 300 mg via ORAL
  Filled 2018-03-22 (×5): qty 1

## 2018-03-22 MED ORDER — CALCIUM CARBONATE ANTACID 500 MG PO CHEW
1.0000 | CHEWABLE_TABLET | Freq: Two times a day (BID) | ORAL | Status: DC
  Administered 2018-03-22 – 2018-03-24 (×5): 200 mg via ORAL
  Filled 2018-03-22 (×5): qty 1

## 2018-03-22 MED ORDER — PNEUMOCOCCAL VAC POLYVALENT 25 MCG/0.5ML IJ INJ
0.5000 mL | INJECTION | INTRAMUSCULAR | Status: DC
  Filled 2018-03-22: qty 0.5

## 2018-03-22 MED ORDER — HEPARIN SODIUM (PORCINE) 1000 UNIT/ML IJ SOLN
INTRAMUSCULAR | Status: AC
  Administered 2018-03-22: 1000 [IU] via INTRAVENOUS
  Filled 2018-03-22: qty 4

## 2018-03-22 MED ORDER — HEPARIN SODIUM (PORCINE) 1000 UNIT/ML DIALYSIS
2000.0000 [IU] | Freq: Once | INTRAMUSCULAR | Status: DC
  Administered 2018-03-23: 3400 [IU] via INTRAVENOUS_CENTRAL

## 2018-03-22 NOTE — Progress Notes (Signed)
PROGRESS NOTE    [name]  GMW:[id number] DOB: 1985/06/25 DOA: 03/18/2018 PCP: [name], MD   Brief Narrative:  [name] is a 33 y.o. 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  Patient has been admitted here numerous times in the past.  Her last admission was in December when she presented with similar complaints.  She was diuresed with IV Lasix and was discharged to home. Patient was admitted for the management of end-stage renal disease.  Nephrology consulted.   She underwent right IJ tunneled dialysis catheter on 03/20/2018 by vascular surgery.  Dialysis  started on 03/20/2018.  Now Awaiting  finalization of  outpatient dialysis facility.She was dialyzed today.  Assessment & Plan:   Principal Problem:   Acute renal failure superimposed on stage 4 chronic kidney disease (HCC) Active Problems:   OBESITY   Anemia due to chronic kidney disease   Essential hypertension, benign   Secondary renal hyperparathyroidism (HCC)   KIDNEY TRANSPLANTATION, HX OF   Type 1 diabetes mellitus with complication, uncontrolled (HCC)   Gastroparesis   Hypocalcemia   Renal failure   Acute kidney injury on CKD stage V: Her kidney function has gradually worsened since last few months.  She follows with nephrology as an outpatient,Dr Lorrene Reid.  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 She was on dialysed from age 25 to age 26 . Nephrology following. Started dialysis on 03/20/18. She underwent right IJ tunneled dialysis catheter on 03/20/2018 by vascular surgery.  Vascular surgery also following for  AV access planning.  HD was done today. Awaiting finalization  of outpatient dialysis center. Vascular surgery also considering permanent dialysis access.  Anemia of chronic disease: Transfused with 1 unit of PRBC. Last Hb of 8.5 .  Continue to monitor CBC.  She has been transfused with Procrit in the past.  Status post kidney transplant: Underwent renal transplant in 2007.  She had failed AV graft on bilateral upper extremities in the past.  She follows with the kidney transplant team at transplant center in [place].  She was on mycophenolate, prednisone and tacrolimus at home which we will continue for now.  Most recent biopsy on 7/19 showed borderline cellular rejection on the background of transplant.  Diabetes mellitus type 1: On insulin.  Continue sliding scale and long acting  insulin.  Last hemoglobin A1c of 10.9 as per 01/2018. Continue to monitor blood sugars. Note to be hyperglycemic this morning.  Will increase insulin dose.  Bilateral lower extremity pain/peripheral neuropathy: Most likely her neuropathy is from her diabetes which is contributing to the bilateral lower extremity pain.  Continue gabapentin.  Needs glycemic control.  Hypocalcemia: She has  secondary hyperparathyroidism.  Supplemented with calcium.  Continue calcitriol.  Phosphorous of 8.4.  Continue PhosLo  History of diabetic gastroparesis: Denies any abdominal pain at present.  Continue Reglan as needed.  Hypertension: BP stable.  Continue Coreg  Anion gap metabolic acidosis: Due to renal failure.  Continue sodium bicarb tablets.  Chronic pain syndrome: Chronic  bilateral lower extremities pain.  She has history of chronic opiate use.  History of XXX syndrome/pseudoseizures/intellectual impairment: Continue supportive care  DVT prophylaxis:SCD Code Status: Full Family Communication: None present at the bedside Disposition Plan: Home after finalization of outpatient dialysis facility  ,end of inpatient dialysis sessions, clearance from nephrology  Consultants: Nephrology  Procedures: None  Antimicrobials: None  Subjective: Patient seen and examined the bedside this morning.  Comfortable this morning.  Denies any pain.  She was eager to go home and asking  when she can go home.No other active issues.  Objective: Vitals:   03/22/18 0915 03/22/18 0945 03/22/18 1015 03/22/18 1040  BP: 121/87 99/72 113/82 (!) 111/97  Pulse: 95 (!) 102 94 97  Resp: 17 18 17 18   Temp:    98.4 F (36.9 C)  TempSrc:    Oral  SpO2:    95%  Weight:    87 kg  Height:        Intake/Output Summary (Last 24 hours) at 03/22/2018 1301 Last data filed at 03/22/2018 1040 Gross per 24 hour  Intake -  Output 2312 ml  Net -2312 ml   Filed Weights   03/20/18 2140 03/22/18 0705 03/22/18 1040  Weight: 89.3 kg 89.6 kg 87 kg    Examination:  General exam: Not in distress,average built HEENT:PERRL,Oral mucosa moist, Ear/Nose normal on gross exam Respiratory system: Bilaterally clear Cardiovascular system: S1 & S2 heard, RRR. No JVD, murmurs, rubs, gallops or clicks. Trace Pedal edema. Tunneled catheter on the right chest Gastrointestinal system: Abdomen is nondistended, soft and nontender. No organomegaly or masses felt. Normal bowel sounds heard. Central nervous system: Alert and oriented. No focal neurological deficits. Extremities: Trace bilateral lower extremity edema, no clubbing ,no cyanosis, distal peripheral pulses palpable. Skin: No rashes, lesions or ulcers,no icterus ,no pallor MSK: Normal muscle bulk,tone ,power Psychiatry: Judgement and insight appear normal. Mood & affect appropriate.     Data Reviewed: I have personally reviewed following labs and imaging studies  CBC: Recent Labs  Lab 03/18/18 1313 03/19/18 0443 03/20/18 0459 03/22/18 1141  WBC 10.0 9.2 7.4 7.8  NEUTROABS 6.0  --  5.1  --   HGB 7.1* 6.7* 7.5* 8.5*  HCT 24.2* 22.9* 24.1* 26.9*  MCV 96.4 95.8 90.6  92.4  PLT 187 185 169 161   Basic Metabolic Panel: Recent Labs  Lab 03/18/18 1313 03/19/18 0443 03/19/18 0454 03/20/18 0459 03/20/18 1915 03/21/18 0523 03/22/18 0329  NA 139 137  --  134*  --  138 137  K 4.5 4.1  --  4.8  --  3.9 3.7  CL 113* 110  --  107  --  102 102  CO2 16* 14*  --  15*  --  24 25  GLUCOSE 184* 198*  --  346*  --  391* 313*  BUN 97* 96*  --  101*  --  62* 69*  CREATININE 7.04* 7.61*  --  7.17*  --  5.28* 5.72*  CALCIUM 5.1* 4.9*  --  5.2* 5.7* 8.2* 8.0*  PHOS  --   --  8.3* 8.4*  --   --  5.7*   GFR: Estimated Creatinine Clearance: 15.4 mL/min (A) (by C-G formula based on SCr of 5.72 mg/dL (H)). Liver Function Tests: Recent Labs  Lab 03/19/18 0443 03/20/18 1915  AST 12*  --   ALT 13 11  ALKPHOS 34*  --   BILITOT 0.6  --   PROT 6.2*  --   ALBUMIN 2.6*  --    No results for input(s): LIPASE, AMYLASE in the last 168 hours. No results for input(s): AMMONIA in  the last 168 hours. Coagulation Profile: No results for input(s): INR, PROTIME in the last 168 hours. Cardiac Enzymes: No results for input(s): CKTOTAL, CKMB, CKMBINDEX, TROPONINI in the last 168 hours. BNP (last 3 results) No results for input(s): PROBNP in the last 8760 hours. HbA1C: No results for input(s): HGBA1C in the last 72 hours. CBG: Recent Labs  Lab 03/21/18 1149 03/21/18 1707 03/21/18 2144 03/22/18 0634 03/22/18 1220  GLUCAP 400* 291* 317* 282* 161*   Lipid Profile: No results for input(s): CHOL, HDL, LDLCALC, TRIG, CHOLHDL, LDLDIRECT in the last 72 hours. Thyroid Function Tests: No results for input(s): TSH, T4TOTAL, FREET4, T3FREE, THYROIDAB in the last 72 hours. Anemia Panel: Recent Labs    03/20/18 0459  FERRITIN 855*  TIBC 132*  IRON 82   Sepsis Labs: No results for input(s): PROCALCITON, LATICACIDVEN in the last 168 hours.  No results found for this or any previous visit (from the past 240 hour(s)).       Radiology Studies: No results  found.      Scheduled Meds: . calcitRIOL  1 mcg Oral Daily  . calcium acetate  2,001 mg Oral TID WC  . calcium carbonate  1 tablet Oral BID  . carvedilol  12.5 mg Oral BID WC  . Chlorhexidine Gluconate Cloth  6 each Topical Q0600  . darbepoetin (ARANESP) injection - DIALYSIS  60 mcg Intravenous Q Mon-HD  . famotidine  10 mg Oral Daily  . furosemide  80 mg Intravenous BID  . gabapentin  300 mg Oral BID  . insulin aspart  0-15 Units Subcutaneous TID WC  . insulin aspart  0-5 Units Subcutaneous QHS  . insulin aspart  8 Units Subcutaneous TID WC  . insulin glargine  60 Units Subcutaneous QHS  . mycophenolate  720 mg Oral BID  . predniSONE  10 mg Oral Q breakfast  . simvastatin  20 mg Oral QHS  . sodium bicarbonate  650 mg Oral BID  . tacrolimus  3 mg Oral BID   Continuous Infusions: . sodium chloride Stopped (03/21/18 0405)  . calcium gluconate       LOS: 4 days    Time spent:25 mins. More than 50% of that time was spent in counseling and/or coordination of care.      Shelly Coss, MD [facility] Pager [phone]  If 7PM-7AM, please contact night-coverage [email or web address] Password [email or web address] 03/22/2018, 1:01 PM

## 2018-03-22 NOTE — Progress Notes (Signed)
[facility] Progress Note    Assessment/ Plan:   32y/o DM ESRD FSGS s/p transplant, intellectual impairment p/w dyspnea and LE edema -> new start iHD  1.  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 She prefers [facility] - S/p KT on Tac/MMF/Pred, can continue for now but will need gradual taper - No HD access, failed old access in UEs b/l - Appreciate Dr. Scot Dock placing the RIJ TC; will see if a permanent access is possible as well -> she had refused angiogram to r/o central stenosis but is willing now with judicious use of contrast. She also refuses LE access. Will notify Dr. Scot Dock   Seen on HD today 111/78 UF 2.5L net  3/2.25 Had to decr goal bec of drop in SBP from 170's to 120's  - stockings to LE and will try additional UF tomorrow.  2.  Anemia of CKD - Not iron deficient - Transfuse as needed - ESA 56mcg on 1/6  3.  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  O/w denies f/c/n/v/dyspnea   Objective:   BP 111/78 (BP Location: Right Arm)   Pulse 99   Temp 98 F (36.7 C) (Oral)   Resp 16   Ht 5\' 6"  (1.676 m)   Wt 89.6 kg   SpO2 94%   BMI 31.88 kg/m  No intake or output data in the 24 hours ending 03/22/18 0914 Weight change:   Physical Exam: General: A&Ox3 NAD HEENT: MMM [place] AT anicteric sclera CV: Heart RRR  Lungs: L/S CTA bilaterally Abd: abd SNT/ND with normal BS GU: Bladder non-palpable Extremities:(+)1-2 bilateralLE edema. Access: RIJ TC  Imaging: Dg Chest Port 1 View  Result Date: 03/20/2018 CLINICAL DATA:  End-stage renal disease, diatek placement EXAM: PORTABLE CHEST 1 VIEW COMPARISON:  03/18/2018 FINDINGS: Bilateral lower lung scarring/atelectasis. No focal consolidation. No frank interstitial edema. No pleural effusion or pneumothorax. The heart is top-normal  in size. Right IJ dual lumen dialysis catheter terminating in the cavoatrial junction. IMPRESSION: Right IJ dual lumen dialysis catheter terminating in the cavoatrial junction. Electronically Signed   By: Julian Hy M.D.   On: 03/20/2018 11:44    Labs: BMET Recent Labs  Lab 03/18/18 1313 03/19/18 0443 03/19/18 0454 03/20/18 0459 03/20/18 1915 03/21/18 0523 03/22/18 0329  NA 139 137  --  134*  --  138 137  K 4.5 4.1  --  4.8  --  3.9 3.7  CL 113* 110  --  107  --  102 102  CO2 16* 14*  --  15*  --  24 25  GLUCOSE 184* 198*  --  346*  --  391* 313*  BUN 97* 96*  --  101*  --  62* 69*  CREATININE 7.04* 7.61*  --  7.17*  --  5.28* 5.72*  CALCIUM 5.1* 4.9*  --  5.2* 5.7* 8.2* 8.0*  PHOS  --   --  8.3* 8.4*  --   --  5.7*   CBC Recent Labs  Lab 03/18/18 1313 03/19/18 0443 03/20/18 0459  WBC 10.0 9.2 7.4  NEUTROABS 6.0  --  5.1  HGB 7.1* 6.7* 7.5*  HCT 24.2* 22.9* 24.1*  MCV 96.4 95.8 90.6  PLT 187 185 169    Medications:    . calcitRIOL  1 mcg Oral Daily  . calcium acetate  1,334 mg Oral TID WC  . carvedilol  12.5 mg Oral BID WC  . Chlorhexidine Gluconate Cloth  6 each Topical Q0600  . darbepoetin (ARANESP) injection - DIALYSIS  60 mcg Intravenous Q Mon-HD  . famotidine  10 mg Oral Daily  . furosemide  80 mg Intravenous BID  . gabapentin  400 mg Oral TID  . heparin  2,000 Units Dialysis Once in dialysis  . insulin aspart  0-15 Units Subcutaneous TID WC  . insulin aspart  0-5 Units Subcutaneous QHS  . insulin aspart  5 Units Subcutaneous TID WC  . insulin glargine  50 Units Subcutaneous QHS  . mycophenolate  720 mg Oral BID  . predniSONE  10 mg Oral Q breakfast  . simvastatin  20 mg Oral QHS  . sodium bicarbonate  650 mg Oral BID  . tacrolimus  3 mg Oral BID      Otelia Santee, MD 03/22/2018, 9:14 AM

## 2018-03-22 NOTE — Consult Note (Signed)
   [facility] Inpatient Consult   03/22/2018  RADIANCE DEADY 04/02/1985 [id number]  Patient screened for extreme high risk for unplanned readmissions [70%] with Medicare plan. Patient is currently per MD notes: [name] is a 33 y.o. female, who previously was on HD then received transplant.  This is now failing. Patient is for restart of hemodialysis and for HD catheter. Her primary care provider is not a [facility] provider however she is associated with [facility] physicians.  Will follow for progress and disposition needs as appropriate.    Please place a [facility] Care Management consult or for questions contact:   Natividad Brood, RN BSN [facility] Liaison  [phone] business mobile phone Toll free office [phone]

## 2018-03-22 NOTE — Progress Notes (Addendum)
Inpatient Diabetes Program Recommendations  AACE/ADA: New Consensus Statement on Inpatient Glycemic Control (2015)  Target Ranges:  Prepandial:   less than 140 mg/dL      Peak postprandial:   less than 180 mg/dL (1-2 hours)      Critically ill patients:  140 - 180 mg/dL   Lab Results  Component Value Date   GLUCAP 282 (H) 03/22/2018   HGBA1C 10.9 (H) 02/03/2018    Review of Glycemic Control Results for Valerie, [name] (MRN [id number]) as of 03/22/2018 09:51  Ref. Range 03/21/2018 11:49 03/21/2018 17:07 03/21/2018 21:44 03/22/2018 06:34  Glucose-Capillary Latest Ref Range: 70 - 99 mg/dL 400 (H) 291 (H) 317 (H) 282 (H)   Diabetes history:DM1 per Dr Donzetta Matters Outpatient Diabetes medications:Tresiba 25 units QAM, Novolog 12-15 units 5-6 times per day Current orders for Inpatient glycemic control:Lantus 50 units QHS, Novolog 0-15 units TID with meals, Novolog 0-5 units QHS, Novolog 5 units TID; Prednisone 10 mg QAM  Inpatient Diabetes Program Recommendations:  Consider: - Increasing Lantus to 60 units QHS. - Consider increasing Novolog 8 units TID (assuming patient is consuming >50% of meal).  Addendum: Spoke with patient regarding diabetes management. Patient is requesting a meter at discharge. She is followed by Dr Donzetta Matters for DM control. Reviewed patient's current A1c of 10.9%. Explained what a A1c is and what it measures. Also reviewed goal A1c with patient, importance of good glucose control @ home, and blood sugar goals.  Patient reports not checking blood sugars recently. She a meter but it is not working effectively and she states, "I don't like using it." Patient will need a new meter at discharge. ([id number]). Encouraged to continue checking CBGs 2-3 times per day and reporting concerns to Dr Donzetta Matters. She has a follow up at the end of January.  Reviewed carb counting, goal setting, appropriate snacks to include protein and the ADA recommendations for increasing activity. Patient feels that walking  would be doable. Patient admits to consuming large amount of snacks to include multiple honey buns, M&Ms and cake. Will place dietitian consult and outpatient education referral for additional support. Patient has no further questions at this time.   Thanks, Bronson Curb, MSN, RNC-OB Diabetes Coordinator [phone] (8a-5p)

## 2018-03-23 DIAGNOSIS — D631 Anemia in chronic kidney disease: Secondary | ICD-10-CM

## 2018-03-23 DIAGNOSIS — N189 Chronic kidney disease, unspecified: Secondary | ICD-10-CM

## 2018-03-23 DIAGNOSIS — I10 Essential (primary) hypertension: Secondary | ICD-10-CM

## 2018-03-23 DIAGNOSIS — Z940 Kidney transplant status: Secondary | ICD-10-CM

## 2018-03-23 DIAGNOSIS — N186 End stage renal disease: Secondary | ICD-10-CM

## 2018-03-23 LAB — BASIC METABOLIC PANEL
Anion gap: 9 (ref 5–15)
BUN: 34 mg/dL — ABNORMAL HIGH (ref 6–20)
CO2: 27 mmol/L (ref 22–32)
CREATININE: 3.85 mg/dL — AB (ref 0.44–1.00)
Calcium: 8 mg/dL — ABNORMAL LOW (ref 8.9–10.3)
Chloride: 100 mmol/L (ref 98–111)
GFR calc Af Amer: 17 mL/min — ABNORMAL LOW (ref >60)
GFR calc non Af Amer: 14 mL/min — ABNORMAL LOW (ref >60)
Glucose, Bld: 340 mg/dL — ABNORMAL HIGH (ref 70–99)
Potassium: 3.8 mmol/L (ref 3.5–5.1)
Sodium: 136 mmol/L (ref 135–145)

## 2018-03-23 LAB — GLUCOSE, CAPILLARY
Glucose-Capillary: 129 mg/dL — ABNORMAL HIGH (ref 70–99)
Glucose-Capillary: 146 mg/dL — ABNORMAL HIGH (ref 70–99)
Glucose-Capillary: 146 mg/dL — ABNORMAL HIGH (ref 70–99)

## 2018-03-23 LAB — PHOSPHORUS: Phosphorus: 3.2 mg/dL (ref 2.5–4.6)

## 2018-03-23 MED ORDER — HEPARIN SODIUM (PORCINE) 1000 UNIT/ML IJ SOLN
INTRAMUSCULAR | Status: AC
  Administered 2018-03-23: 3400 [IU] via INTRAVENOUS_CENTRAL
  Filled 2018-03-23: qty 4

## 2018-03-23 MED ORDER — INSULIN ASPART 100 UNIT/ML ~~LOC~~ SOLN
12.0000 [IU] | Freq: Three times a day (TID) | SUBCUTANEOUS | Status: DC
  Administered 2018-03-23 – 2018-03-24 (×4): 12 [IU] via SUBCUTANEOUS

## 2018-03-23 MED ORDER — CALCITRIOL 0.5 MCG PO CAPS
ORAL_CAPSULE | ORAL | Status: AC
  Administered 2018-03-23: 1 ug via ORAL
  Filled 2018-03-23: qty 2

## 2018-03-23 MED ORDER — INSULIN GLARGINE 100 UNIT/ML ~~LOC~~ SOLN
65.0000 [IU] | Freq: Every day | SUBCUTANEOUS | Status: DC
  Administered 2018-03-23: 65 [IU] via SUBCUTANEOUS
  Filled 2018-03-23 (×2): qty 0.65

## 2018-03-23 MED ORDER — HEPARIN SODIUM (PORCINE) 5000 UNIT/ML IJ SOLN
5000.0000 [IU] | Freq: Three times a day (TID) | INTRAMUSCULAR | Status: DC
  Administered 2018-03-23 – 2018-03-24 (×3): 5000 [IU] via SUBCUTANEOUS
  Filled 2018-03-23 (×4): qty 1

## 2018-03-23 MED ORDER — ACETAMINOPHEN 325 MG PO TABS
ORAL_TABLET | ORAL | Status: AC
  Administered 2018-03-23: 13:00:00
  Filled 2018-03-23: qty 2

## 2018-03-23 NOTE — Progress Notes (Signed)
This nurse responded to the patient's call requesting something to eat.  This nurse noted with the patient that her blood sugar this evening was 343, and that the options we have on the floor were probably not compatible with a renal diet.  The patient stated that there are bags in the refrigerator at the nurse's desk with a [place] sandwich and sugar-free cookie in them and that she would like one of those.  This nurse checked the refrigerator and found two bags that were labeled for another patient, and contained roast beef sandwich and vanilla wafers.  This nurse informed the patient that there were no [place] sandwiches in the refrigerator.  The patient repeatedly questioned this nurse on the matter, stating "they've always given me what I wanted," and "I think you're lying to me."  This nurse apologized to the patient for her feeling that way, and asked if she would like one of our TV dinners instead.  The patient stated she would take the meat loaf dinner, and this nurse prepared this for her.  Meal was delivered by nurse tech [place] continue to monitor.

## 2018-03-23 NOTE — Progress Notes (Signed)
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 She prefers [facility] -S/p KT on Tac/MMF/Pred, can continue for now; I would continue for now as she may have prospective living donor candidates including her fiance, 2 of her siblings. We will send a referral to [facility] [place] transplant when she is discharged. -No HD access, failed old access in UEs b/l - Appreciate Dr. Scot Dock placing the RIJ TC; will see if a permanent access is possible as well -> she had refused angiogram to r/o central stenosis. She keeps changing her mind and this AM she is refusing again bec of all the prospective living donor candidates.   Seen on HD today 186/121 UF 2.5L net  goal On today mainly for UF  - stockings to LE and again on today mainly for  UF as her BP dropped yest with initially high goals.  2.Anemia of CKD - Not iron deficient - Transfuse as needed - ESA 29mcg on 1/6  3.CKD-BMD, on PhosLo, C3on phoslo 2 tabs TIDM - Recheck phos -> incr phoslo to 3 tabs  Subjective:   Feels well. Denies f/c/n/v/dyspnea/CP   Objective:   BP (!) 186/121 (BP Location: Left Arm)   Pulse 95   Temp 99.1 F (37.3 C) (Oral)   Resp 18   Ht 5\' 6"  (1.676 m)   Wt 87 kg   SpO2 92%   BMI 30.96 kg/m   Intake/Output Summary (Last 24 hours) at 03/23/2018 7989 Last data filed at 03/22/2018 1040 Gross per 24 hour  Intake -  Output 2312 ml  Net -2312 ml   Weight change:   Physical Exam: General: A&Ox3 NAD HEENT: MMM [place] AT anicteric sclera CV: Heart RRR  Lungs: L/S CTA bilaterally Abd: abd SNT/ND with normal BS GU: Bladder non-palpable Extremities:(+)1-2 bilateralLE edema. Access: RIJ TC  Imaging: No results found.  Labs: BMET Recent Labs  Lab 03/18/18 1313 03/19/18 0443 03/19/18 0454 03/20/18 0459  03/20/18 1915 03/21/18 0523 03/22/18 0329 03/23/18 0432  NA 139 137  --  134*  --  138 137 136  K 4.5 4.1  --  4.8  --  3.9 3.7 3.8  CL 113* 110  --  107  --  102 102 100  CO2 16* 14*  --  15*  --  24 25 27   GLUCOSE 184* 198*  --  346*  --  391* 313* 340*  BUN 97* 96*  --  101*  --  62* 69* 34*  CREATININE 7.04* 7.61*  --  7.17*  --  5.28* 5.72* 3.85*  CALCIUM 5.1* 4.9*  --  5.2* 5.7* 8.2* 8.0* 8.0*  PHOS  --   --  8.3* 8.4*  --   --  5.7* 3.2   CBC Recent Labs  Lab 03/18/18 1313 03/19/18 0443 03/20/18 0459 03/22/18 1141  WBC 10.0 9.2 7.4 7.8  NEUTROABS 6.0  --  5.1  --   HGB 7.1* 6.7* 7.5* 8.5*  HCT 24.2* 22.9* 24.1* 26.9*  MCV 96.4 95.8 90.6 92.4  PLT 187 185 169 204    Medications:    . calcitRIOL  1 mcg Oral Daily  . calcium acetate  2,001 mg Oral TID WC  . calcium carbonate  1 tablet Oral BID  . carvedilol  12.5 mg Oral BID WC  .  Chlorhexidine Gluconate Cloth  6 each Topical Q0600  . darbepoetin (ARANESP) injection - DIALYSIS  60 mcg Intravenous Q Mon-HD  . famotidine  10 mg Oral Daily  . furosemide  80 mg Intravenous BID  . gabapentin  300 mg Oral BID  . insulin aspart  0-15 Units Subcutaneous TID WC  . insulin aspart  0-5 Units Subcutaneous QHS  . insulin aspart  8 Units Subcutaneous TID WC  . insulin glargine  60 Units Subcutaneous QHS  . mycophenolate  720 mg Oral BID  . pneumococcal 23 valent vaccine  0.5 mL Intramuscular Tomorrow-1000  . predniSONE  10 mg Oral Q breakfast  . simvastatin  20 mg Oral QHS  . sodium bicarbonate  650 mg Oral BID  . tacrolimus  3 mg Oral BID      Otelia Santee, MD 03/23/2018, 8:19 AM

## 2018-03-23 NOTE — Plan of Care (Signed)
Nutrition Education Note  RD consulted for nutrition education regarding diabetes. Also discussed nutrition therapy for ESRD given new ESRD in the setting of failed kidney transplant.  Pt reports eating 3 meals daily, "whatever I want." Pt states that she checks her blood sugars 3-4 times daily and that they run between 100-200 when she checks them. Pt shares that she has had a pump before but recently has been using sliding scale insulin. Pt able to show me a photo on her phone of her sliding scale insulin regimen.  Pt reports discussing nutrition therapy for type 1 diabetes with outpatient diabetes MD. RD discussed carbohydrate counting with pt.  Pt states that she is going to start receiving outpatient HD three times weekly "until I get a new kidney."  Pt states that she really enjoys milk. Discussed that milk is high in phosphorus. At this time, pt's potassium and phosphorus labs are WNL.  Lab Results  Component Value Date   HGBA1C 10.9 (H) 02/03/2018   In terms of ESRD on HD, explained why diet restrictions are needed and provided lists of foods to limit/avoid that are high potassium, sodium, and phosphorus. Provided specific recommendations on safer alternatives of these foods. Strongly encouraged compliance of this diet while on dialysis.  Discussed importance of protein intake at each meal and snack. Provided examples of how to maximize protein intake throughout the day. Discussed need for fluid restriction with dialysis, importance of minimizing weight gain between HD treatments, and renal-friendly beverage options.  Encouraged pt to discuss specific diet questions/concerns with RD at HD outpatient facility.  RD also provided "Carbohydrate Counting for People with Diabetes" handout from the Academy of Nutrition and Dietetics. Discussed different food groups and their effects on blood sugar, emphasizing carbohydrate-containing foods. Provided list of carbohydrates and recommended  serving sizes of common foods.  Discussed importance of controlled and consistent carbohydrate intake throughout the day. Provided examples of ways to balance meals/snacks and encouraged intake of high-fiber, whole grain complex carbohydrates. Teach back method used.  Expect fair compliance.  Body mass index is 29.96 kg/m. Pt meets criteria for overweight based on current BMI.  Current diet order is renal/carb mod with 1200 ml fluid restriction. No meal completion charted at this time. Labs and medications reviewed. No further nutrition interventions warranted at this time. RD contact information provided. If additional nutrition issues arise, please re-consult RD.   Gaynell Face, MS, RD, LDN Inpatient Clinical Dietitian Pager: [phone] Weekend/After Hours: [phone]

## 2018-03-23 NOTE — Progress Notes (Addendum)
PROGRESS NOTE    [name]  BOF:[id number] DOB: [birth year] DOA: 03/18/2018 PCP: [name], MD   Brief Narrative:   [name] is a 33 y.o. 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  Patient has been admitted here numerous times in the past.  Her last admission was in December when she presented with similar complaints.  She was diuresed with IV Lasix and was discharged to home.  Patient was admitted for the management of end-stage renal disease.  Nephrology consulted. She underwent right IJ tunneled dialysis catheter on 03/20/2018 by vascular surgery.  Dialysis  started on 03/20/2018.  Now Awaiting  finalization of  outpatient dialysis facility.She was dialyzed today.  Subjective:  Patient in bed, appears comfortable, denies any headache, no fever, no chest pain or pressure, no shortness of breath , no abdominal pain. No focal weakness.   Assessment & Plan:    Acute kidney injury on CKD stage V:  She initially had underlying focal segmental glomera sclerosis, underwent renal transplant, was following with nephrologist Dr. Christean Grief.  Unfortunately her renal function has gradually continued to decline to the point that she is ESRD now.  She underwent a right IJ dialysis catheter placement on 03/20/2018 by vascular surgery and dialysis was initiated on 03/20/2018.  At this time renal problem is being followed and managed by nephrology.  Vascular surgery is also contemplating placing permanent access.  Outside CLIP process underway as well.  Anemia of chronic disease: Transfused with 1 unit of PRBC. Last Hb of 8.5 .  Continue to monitor CBC.  She has been transfused with  Procrit in the past.  Status post kidney transplant: Underwent renal transplant in 2007.  She had failed AV graft on bilateral upper extremities in the past.  She follows with the kidney transplant team at transplant center in [place].  She was on mycophenolate, prednisone and tacrolimus at home which we will continue for now.  Most recent biopsy on 7/19 showed borderline cellular rejection on the background of transplant.  Bilateral lower extremity pain/peripheral neuropathy: Most likely her neuropathy is from her diabetes which is contributing to the bilateral lower extremity pain.  Continue gabapentin.  Needs glycemic control.  Hypocalcemia: She has  secondary hyperparathyroidism.  Supplemented with calcium.  Continue calcitriol.  Phosphorous of 8.4.  Continue PhosLo  History of diabetic gastroparesis: Denies any abdominal pain at present.  Continue Reglan as needed.  Hypertension: BP stable.  Continue Coreg  Anion gap metabolic acidosis: Due to renal failure.  Continue sodium bicarb tablets.  Chronic pain syndrome: Chronic  bilateral lower extremities pain.  She has history of chronic opiate use.  History of XXX syndrome/pseudoseizures/intellectual impairment: Continue supportive care  Diabetes mellitus type 1: On insulin.  Continue sliding scale and long acting  insulin.    Outpatient control will add pre-meal NovoLog for better control.  Lab Results  Component Value Date   HGBA1C 10.9 (H) 02/03/2018   CBG (last 3)  Recent Labs    03/22/18 1220 03/22/18 1720 03/22/18 2128  GLUCAP 161* 267* 343*     DVT prophylaxis:SCD- Heparin started on 03/23/2018 Code Status: Full Family Communication: None present at the bedside Disposition Plan: Home after finalization of outpatient dialysis facility ,end  of inpatient dialysis sessions, clearance from nephrology  Consultants: Nephrology  Procedures: None  Antimicrobials: None    Objective: Vitals:   03/23/18 0830  03/23/18 0900 03/23/18 0930 03/23/18 1000  BP: (!) 141/106 (!) 158/107 (!) 131/98 (!) 129/96  Pulse: 98 (!) 101 (!) 106 (!) 103  Resp: 18 17 16 14   Temp:      TempSrc:      SpO2:      Weight:      Height:        Intake/Output Summary (Last 24 hours) at 03/23/2018 1039 Last data filed at 03/22/2018 1040 Gross per 24 hour  Intake -  Output 2312 ml  Net -2312 ml   Filed Weights   03/22/18 0705 03/22/18 1040 03/23/18 0711  Weight: 89.6 kg 87 kg 87 kg    Examination:  Awake Alert, Oriented X 3, No new F.N deficits, Normal affect [place].AT,PERRAL, R IJ catheter Supple Neck,No JVD, No cervical lymphadenopathy appriciated.  Symmetrical Chest wall movement, Good air movement bilaterally, CTAB RRR,No Gallops, Rubs or new Murmurs, No Parasternal Heave +ve B.Sounds, Abd Soft, No tenderness, No organomegaly appriciated, No rebound - guarding or rigidity. No Cyanosis, Clubbing or edema, No new Rash or bruise   Data Reviewed: I have personally reviewed following labs and imaging studies  CBC: Recent Labs  Lab 03/18/18 1313 03/19/18 0443 03/20/18 0459 03/22/18 1141  WBC 10.0 9.2 7.4 7.8  NEUTROABS 6.0  --  5.1  --   HGB 7.1* 6.7* 7.5* 8.5*  HCT 24.2* 22.9* 24.1* 26.9*  MCV 96.4 95.8 90.6 92.4  PLT 187 185 169 211   Basic Metabolic Panel: Recent Labs  Lab 03/19/18 0443 03/19/18 0454 03/20/18 0459 03/20/18 1915 03/21/18 0523 03/22/18 0329 03/23/18 0432  NA 137  --  134*  --  138 137 136  K 4.1  --  4.8  --  3.9 3.7 3.8  CL 110  --  107  --  102 102 100  CO2 14*  --  15*  --  24 25 27   GLUCOSE 198*  --  346*  --  391* 313* 340*  BUN 96*  --  101*  --  62* 69* 34*  CREATININE 7.61*  --  7.17*  --  5.28* 5.72* 3.85*  CALCIUM 4.9*  --  5.2* 5.7* 8.2* 8.0* 8.0*  PHOS  --  8.3* 8.4*  --   --  5.7* 3.2   GFR: Estimated Creatinine Clearance: 22.9 mL/min (A) (by C-G formula based on SCr of 3.85 mg/dL (H)). Liver Function Tests: Recent Labs  Lab 03/19/18 0443 03/20/18 1915    AST 12*  --   ALT 13 11  ALKPHOS 34*  --   BILITOT 0.6  --   PROT 6.2*  --   ALBUMIN 2.6*  --    No results for input(s): LIPASE, AMYLASE in the last 168 hours. No results for input(s): AMMONIA in the last 168 hours. Coagulation Profile: No results for input(s): INR, PROTIME in the last 168 hours. Cardiac Enzymes: No results for input(s): CKTOTAL, CKMB, CKMBINDEX, TROPONINI in the last 168 hours. BNP (last 3 results) No results for input(s): PROBNP in the last 8760 hours. HbA1C: No results for input(s): HGBA1C in the last 72 hours. CBG: Recent Labs  Lab 03/21/18 2144 03/22/18 0634 03/22/18 1220 03/22/18 1720 03/22/18 2128  GLUCAP 317* 282* 161* 267* 343*   Lipid Profile: No results for input(s): CHOL, HDL, LDLCALC, TRIG, CHOLHDL, LDLDIRECT in the last 72 hours.  Thyroid Function Tests: No results for input(s): TSH, T4TOTAL, FREET4, T3FREE, THYROIDAB in the last 72 hours. Anemia Panel: No results for input(s): VITAMINB12, FOLATE, FERRITIN, TIBC, IRON, RETICCTPCT in the last 72 hours. Sepsis Labs: No results for input(s): PROCALCITON, LATICACIDVEN in the last 168 hours.  No results found for this or any previous visit (from the past 240 hour(s)).   Radiology Studies: No results found.  Scheduled Meds: . calcitRIOL  1 mcg Oral Daily  . calcium acetate  2,001 mg Oral TID WC  . calcium carbonate  1 tablet Oral BID  . carvedilol  12.5 mg Oral BID WC  . Chlorhexidine Gluconate Cloth  6 each Topical Q0600  . darbepoetin (ARANESP) injection - DIALYSIS  60 mcg Intravenous Q Mon-HD  . famotidine  10 mg Oral Daily  . furosemide  80 mg Intravenous BID  . gabapentin  300 mg Oral BID  . insulin aspart  0-15 Units Subcutaneous TID WC  . insulin aspart  0-5 Units Subcutaneous QHS  . insulin aspart  8 Units Subcutaneous TID WC  . insulin glargine  60 Units Subcutaneous QHS  . mycophenolate  720 mg Oral BID  . pneumococcal 23 valent vaccine  0.5 mL Intramuscular Tomorrow-1000   . predniSONE  10 mg Oral Q breakfast  . simvastatin  20 mg Oral QHS  . sodium bicarbonate  650 mg Oral BID  . tacrolimus  3 mg Oral BID   Continuous Infusions: . sodium chloride Stopped (03/21/18 0405)  . calcium gluconate       LOS: 5 days    Time spent:25 mins. More than 50% of that time was spent in counseling and/or coordination of care.  Signature  Lala Lund M.D on 03/23/2018 at 10:39 AM   -  To page go to [email or web address]

## 2018-03-24 LAB — GLUCOSE, CAPILLARY
Glucose-Capillary: 148 mg/dL — ABNORMAL HIGH (ref 70–99)
Glucose-Capillary: 160 mg/dL — ABNORMAL HIGH (ref 70–99)

## 2018-03-24 MED ORDER — AMLODIPINE BESYLATE 10 MG PO TABS
10.0000 mg | ORAL_TABLET | Freq: Every day | ORAL | Status: DC
  Administered 2018-03-24: 10 mg via ORAL
  Filled 2018-03-24: qty 1

## 2018-03-24 MED ORDER — HYDRALAZINE HCL 50 MG PO TABS: 50.0000 mg | ORAL_TABLET | Freq: Three times a day (TID) | ORAL | 0 refills | Status: DC

## 2018-03-24 MED ORDER — AMLODIPINE BESYLATE 10 MG PO TABS: 10.0000 mg | ORAL_TABLET | Freq: Every day | ORAL | 0 refills | Status: DC

## 2018-03-24 MED ORDER — HYDRALAZINE HCL 50 MG PO TABS: 50.0000 mg | ORAL_TABLET | Freq: Three times a day (TID) | ORAL | Status: DC

## 2018-03-24 NOTE — Progress Notes (Signed)
   03/24/18 1400  Clinical Encounter Type  Visited With Patient  Visit Type Initial  Referral From Nurse  Consult/Referral To Chaplain  Spiritual Encounters  Spiritual Needs Emotional;Literature  Stress Factors  Patient Stress Factors None identified  Responded to spiritual care consult. PT was alert and ready to get discharged. I gave PT an overview of the AD as she is going to take it home and discuss it over with her family. I offered spiritual care with words of encouragement and ministry of presence.  Chaplain Fidel Levy

## 2018-03-24 NOTE — Care Management Important Message (Signed)
Important Message  Patient Details  Name: [name] MRN: [id number] Date of Birth: 06-01-1985   Medicare Important Message Given:  Yes    Gabriela Irigoyen Montine Circle 03/24/2018, 3:42 PM

## 2018-03-24 NOTE — Discharge Instructions (Signed)
Follow with Primary MD [name], MD in 7 days   Get CBC, BMP checked  by Primary MD in 5-7 days   Activity: As tolerated with Full fall precautions use walker/cane & assistance as needed  Disposition Home   Diet: Renal, 1.5 L/day total fluid restriction.  Special Instructions: If you have smoked or chewed Tobacco  in the last 2 yrs please stop smoking, stop any regular Alcohol  and or any Recreational drug use.  On your next visit with your primary care physician please Get Medicines reviewed and adjusted.  Please request your Prim.MD to go over all Hospital Tests and Procedure/Radiological results at the follow up, please get all Hospital records sent to your Prim MD by signing hospital release before you go home.  If you experience worsening of your admission symptoms, develop shortness of breath, life threatening emergency, suicidal or homicidal thoughts you must seek medical attention immediately by calling 911 or calling your MD immediately  if symptoms less severe.  You Must read complete instructions/literature along with all the possible adverse reactions/side effects for all the Medicines you take and that have been prescribed to you. Take any new Medicines after you have completely understood and accpet all the possible adverse reactions/side effects.

## 2018-03-24 NOTE — Progress Notes (Signed)
Pt given discharge instructions, prescriptions, and care notes via [place] RN/SWAT RN. Pt verbalized understanding AEB no further questions or concerns at this time. IV was discontinued, no redness, pain, or swelling noted at this time. Telemetry discontinued and Centralized Telemetry was notified. Pt left the floor via wheelchair with staff in stable condition.

## 2018-03-24 NOTE — Progress Notes (Signed)
Accepted at [facility] .1st Treatment Monday,January13,2020 at 12:30pm .Schedule is Monday,Wednesday,Friday at 12:30pm

## 2018-03-24 NOTE — Progress Notes (Signed)
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 She prefers [facility] -S/p KT on Tac/MMF/Pred, can continue for now; I would continue for now as she may have prospective living donor candidates including her fiance, 2 of her siblings. We will send a referral to [facility] transplant when she is discharged. -No HD access, failed old access in [facility] b/l - Appreciate Dr. Scot Dock placing the RIJ TC; will see if a permanent access is possible as well-> she had refused angiogram to r/o central stenosis. She keeps changing her mind and this AM she is refusing again bec of all the prospective living donor candidates.   Last HD wed and Thur She tolerated txs; I would set her EDW at 85.5kg and can slowly challenge as outpt.  Plan on HD tomorrow if she's still here; if she gets a spot at [facility] unit then will give her a short tx today.   2.Anemia of CKD - Not iron deficient - Transfuse as needed - ESA 49mcg on 1/6  3.CKD-BMD, on PhosLo, C3on phoslo 2 tabs TIDM - Recheck phos-> incr phoslo to 3 tabs - Phos 3.2 (within range) Subjective:   Feels well. Denies f/c/n/v/dyspnea/CP   Objective:   BP (!) 173/114 (BP Location: Left Arm)   Pulse 97   Temp 98.8 F (37.1 C) (Oral)   Resp 19   Ht 5\' 6"  (1.676 m)   Wt 84.2 kg   SpO2 95%   BMI 29.96 kg/m   Intake/Output Summary (Last 24 hours) at 03/24/2018 1051 Last data filed at 03/23/2018 1056 Gross per 24 hour  Intake -  Output 2500 ml  Net -2500 ml   Weight change: -2.6 kg  Physical Exam: General: A&Ox3 NAD HEENT: MMM [place] AT anicteric sclera CV: Heart RRR  Lungs: L/S CTA bilaterally Abd: abd SNT/ND with normal BS GU: Bladder non-palpable Extremities:(+)1-2 bilateralLE edema. Access: RIJ TC  Imaging: No results  found.  Labs: BMET Recent Labs  Lab 03/18/18 1313 03/19/18 0443 03/19/18 0454 03/20/18 0459 03/20/18 1915 03/21/18 0523 03/22/18 0329 03/23/18 0432  NA 139 137  --  134*  --  138 137 136  K 4.5 4.1  --  4.8  --  3.9 3.7 3.8  CL 113* 110  --  107  --  102 102 100  CO2 16* 14*  --  15*  --  24 25 27   GLUCOSE 184* 198*  --  346*  --  391* 313* 340*  BUN 97* 96*  --  101*  --  62* 69* 34*  CREATININE 7.04* 7.61*  --  7.17*  --  5.28* 5.72* 3.85*  CALCIUM 5.1* 4.9*  --  5.2* 5.7* 8.2* 8.0* 8.0*  PHOS  --   --  8.3* 8.4*  --   --  5.7* 3.2   CBC Recent Labs  Lab 03/18/18 1313 03/19/18 0443 03/20/18 0459 03/22/18 1141  WBC 10.0 9.2 7.4 7.8  NEUTROABS 6.0  --  5.1  --   HGB 7.1* 6.7* 7.5* 8.5*  HCT 24.2* 22.9* 24.1* 26.9*  MCV 96.4 95.8 90.6 92.4  PLT 187 185 169 204    Medications:    . amLODipine  10 mg Oral Daily  . calcitRIOL  1 mcg Oral Daily  . calcium acetate  2,001 mg Oral  TID WC  . calcium carbonate  1 tablet Oral BID  . carvedilol  12.5 mg Oral BID WC  . Chlorhexidine Gluconate Cloth  6 each Topical Q0600  . darbepoetin (ARANESP) injection - DIALYSIS  60 mcg Intravenous Q Mon-HD  . famotidine  10 mg Oral Daily  . furosemide  80 mg Intravenous BID  . gabapentin  300 mg Oral BID  . heparin injection (subcutaneous)  5,000 Units Subcutaneous Q8H  . insulin aspart  0-15 Units Subcutaneous TID WC  . insulin aspart  0-5 Units Subcutaneous QHS  . insulin aspart  12 Units Subcutaneous TID WC  . insulin glargine  65 Units Subcutaneous QHS  . mycophenolate  720 mg Oral BID  . pneumococcal 23 valent vaccine  0.5 mL Intramuscular Tomorrow-1000  . predniSONE  10 mg Oral Q breakfast  . simvastatin  20 mg Oral QHS  . sodium bicarbonate  650 mg Oral BID  . tacrolimus  3 mg Oral BID      Otelia Santee, MD 03/24/2018, 10:51 AM

## 2018-03-24 NOTE — Addendum Note (Signed)
Addended by: Darvin Neighbours on: 03/24/2018 04:54 PM   Modules accepted: Level of Service, SmartSet

## 2018-03-24 NOTE — Consult Note (Signed)
   [facility] Inpatient Consult   03/24/2018  SENNA LAPE October 29, 1985 [id number]  Patient was assessed for [facility] [facility] for community services. Patient was previously active with [facility].  Met with patient at bedside regarding being restarted with [facility] services. Patient concerned for transportation from Kidney center back home, to the only facility at [facility] that could take her at this time.  She states, "my brother can get me there but he works second shift and cannot get me back home. [facility] following.  She endorses Dr. Jeanie Cooks as her primary care provider.  [facility] Management will follow through her [facility].  Of note, [facility] Care Management services does not replace or interfere with any services that are arranged by inpatient case management or social work. For additional questions or referrals please contact:  Natividad Brood, RN BSN [facility] Liaison  [phone] business mobile phone Toll free office [phone]

## 2018-03-24 NOTE — Progress Notes (Signed)
This encounter was created in error - please disregard.

## 2018-03-24 NOTE — Discharge Summary (Signed)
[name] [facility]:[id number] DOB: 12/09/1985 DOA: 03/18/2018  PCP: [name], MD  Admit date: 03/18/2018  Discharge date: 03/24/2018  Admitted From: Home   Disposition:  Home   Recommendations for Outpatient Follow-up:   Follow up with PCP in 1-2 weeks  PCP Please obtain BMP/CBC, 2 view CXR in 1week,  (see Discharge instructions)   PCP Please follow up on the following pending results:    Home Health: None   Equipment/Devices: None  Consultations: Renal, VVS Discharge Condition: Stable   CODE STATUS: Fill   Diet Recommendation: Heart Healthy Low Carb, 1.5 L/day total fluid restriction    Chief Complaint  Patient presents with  . 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 Patient has been admitted here numerous times in the past. Her last admission was in December when she presented with similar complaints. She was diuresed with IV Lasix and was discharged to home.  Patient was admitted for the management of end-stage renal disease.  Nephrology consulted. She underwent right IJ tunneled dialysis catheter on 03/20/2018 by vascular surgery.  Dialysis  started on 03/20/2018.  Now Awaiting  finalization of  outpatient dialysis facility.She was dialyzed today.                                                                 Hospital Course   Acute  kidney injury on CKD stage V: She initially had underlying focal segmental glomera sclerosis, underwent renal transplant, was following with nephrologist Dr. Christean Grief.  Unfortunately her renal function has gradually continued to decline to the point that she is ESRD now.  She underwent a right IJ dialysis catheter placement on 03/20/2018 by vascular surgery and dialysis was initiated on 03/20/2018.  At this time renal problem is being followed and managed by nephrology.  Vascular surgery is also contemplating placing permanent access.  Discussed her case with the nephrology team she was excepted to [facility] dialysis unit for dialysis runs under the care of Dr. Marval Regal.  She will be discharged today.  Per nephrologist Dr. Augustin Coupe she is good to go today, she has had 2 consecutive dialysis runs yesterday and the day before.  Anemia of chronic disease: Transfused with 1 unit of PRBC. Last Hb of 8.5 .Continue to monitor CBC. She has been transfused with Procrit in the past.  Status post kidney transplant: Underwent renal transplant in 2007. She hadfailed AV graft  on bilateral upper extremities in the past.She follows with the kidney transplant team at transplant center in [place]. She was on mycophenolate, prednisone and tacrolimus at home which we will continue for now. Most recent biopsy on 7/19 showed borderline cellular rejection on the background of transplant.  Bilateral lower extremity pain/peripheral neuropathy: Most likely her neuropathy is from her diabetes which is contributing to the bilateral lower extremity pain. Continue gabapentin. Needs glycemic control.  Hypocalcemia: She hassecondary hyperparathyroidism. Supplemented with calcium.Continue calcitriol.Phosphorous of 8.4. Continue PhosLo  History of diabetic gastroparesis:Denies any abdominal pain at present. Continue Reglan as needed.  Hypertension: Currently on Coreg, poor control added Norvasc and  hydralazine.    Quested her to get PCP to monitor and adjust as needed.  Anion gap metabolic acidosis:Due to renal failure. Continue sodium bicarb tablets.  Chronic pain syndrome:Chronic  bilateral lower extremities pain. She has history of chronic opiate use.  History ofXXXsyndrome/pseudoseizures/intellectual impairment: Continue supportive care  Diabetes mellitus type 1: Continue home regimen follow with PCP.  Requested to check Accu-Cheks QA CHS.     Discharge diagnosis     Principal Problem:   Acute renal failure superimposed on stage 4 chronic kidney disease (HCC) Active Problems:   OBESITY   Anemia due to chronic kidney disease   Essential hypertension, benign   Secondary renal hyperparathyroidism (HCC)   KIDNEY TRANSPLANTATION, HX OF   Type 1 diabetes mellitus with complication, uncontrolled (HCC)   Gastroparesis   Hypocalcemia   Renal failure    Discharge instructions    Discharge Instructions    Ambulatory referral to Nutrition and Diabetic Education   Complete by:  As directed    Discharge instructions   Complete by:  As directed    Follow with Primary MD [name], MD in 7 days   Get CBC, BMP checked  by Primary MD in 5-7 days   Activity: As tolerated with Full fall precautions use walker/cane & assistance as needed  Disposition Home   Diet: Renal Low Carb, 1.5 L/day total fluid restriction.  Check CBGs QA CHS  Special Instructions: If you have smoked or chewed Tobacco  in the last 2 yrs please stop smoking, stop any regular Alcohol  and or any Recreational drug use.  On your next visit with your primary care physician please Get Medicines reviewed and adjusted.  Please request your Prim.MD to go over all Hospital Tests and Procedure/Radiological results at the follow up, please get all Hospital records sent to your Prim MD by signing hospital release before you go home.  If you experience worsening of your admission symptoms, develop  shortness of breath, life threatening emergency, suicidal or homicidal thoughts you must seek medical attention immediately by calling 911 or calling your MD immediately  if symptoms less severe.  You Must read complete instructions/literature along with all the possible adverse reactions/side effects for all the Medicines you take and that have been prescribed to you. Take any new Medicines after you have completely understood and accpet all the possible adverse reactions/side effects.   Increase activity slowly   Complete by:  As directed       Discharge Medications   Allergies as of 03/24/2018      Reactions   Benadryl [diphenhydramine-zinc Acetate] Shortness Of Breath   Motrin [ibuprofen] Shortness Of Breath, Itching   Per pt   Banana Other (See Comments)   Sick on the stomach   Diphenhydramine Hcl    REACTION: Stopped breathing in ICU   Doxycycline  Shortness of Breath    Iron Dextran    REACTION: vein irritation   Shellfish Allergy Hives      Medication List    STOP taking these medications   amoxicillin-clavulanate 500-125 MG tablet Commonly known as:  AUGMENTIN     TAKE these medications   ACCU-CHEK SOFTCLIX LANCETS lancets Use to check blood sugar 4 times per day.   acetaminophen 500 MG tablet Commonly known as:  TYLENOL Take 1,000 mg by mouth daily as needed for moderate pain.   amLODipine 10 MG tablet Commonly known as:  NORVASC Take 1 tablet (10 mg total) by mouth daily. Start taking on:  March 25, 2018   calcitRIOL 0.25 MCG capsule Commonly known as:  ROCALTROL Take 0.25 mcg by mouth daily.   CALCIUM 1000 + D PO Take 1 tablet by mouth daily.   calcium acetate 667 MG capsule Commonly known as:  PHOSLO Take 1,334 mg by mouth 3 (three) times daily with meals.   calcium elemental as carbonate 400 MG chewable tablet Commonly known as:  BARIATRIC TUMS ULTRA Chew 1,000 mg by mouth 3 (three) times daily as needed for heartburn.   carvedilol 12.5 MG  tablet Commonly known as:  COREG Take 12.5 mg by mouth 2 (two) times daily with a meal.   dicyclomine 20 MG tablet Commonly known as:  BENTYL Take 1 tablet (20 mg total) by mouth 2 (two) times daily.   famotidine 10 MG tablet Commonly known as:  PEPCID Take 10 mg by mouth daily.   fluticasone 50 MCG/ACT nasal spray Commonly known as:  FLONASE Place 2 sprays into both nostrils daily. What changed:    when to take this  reasons to take this   gabapentin 300 MG capsule Commonly known as:  NEURONTIN Take 300 mg by mouth 3 (three) times daily.   hydrALAZINE 50 MG tablet Commonly known as:  APRESOLINE Take 1 tablet (50 mg total) by mouth every 8 (eight) hours.   hydrOXYzine 50 MG tablet Commonly known as:  ATARAX/VISTARIL Take 1 tablet (50 mg total) by mouth 2 (two) times daily as needed for itching.   insulin degludec 100 UNIT/ML Sopn FlexTouch Pen Commonly known as:  TRESIBA Inject 0.6 mLs (60 Units total) into the skin 2 (two) times daily. Reports taking 24 units QAM   loperamide 2 MG capsule Commonly known as:  IMODIUM Take 2 mg by mouth as needed for diarrhea or loose stools.   metoCLOPramide 10 MG tablet Commonly known as:  REGLAN Take 1 tablet (10 mg total) by mouth 3 (three) times daily before meals.   mycophenolate 360 MG Tbec EC tablet Commonly known as:  MYFORTIC Take 720 mg by mouth 2 (two) times daily.   NOVOLOG FLEXPEN 100 UNIT/ML FlexPen Generic drug:  insulin aspart Inject 12-15 Units into the skin See admin instructions. 5-6 times per day as needed   ondansetron 4 MG disintegrating tablet Commonly known as:  ZOFRAN ODT Take 1 tablet (4 mg total) by mouth every 4 (four) hours as needed for nausea or vomiting.   predniSONE 10 MG tablet Commonly known as:  DELTASONE Take 1 tablet (10 mg total) by mouth every morning.   promethazine 25 MG tablet Commonly known as:  PHENERGAN Take 1 tablet (25 mg total) by mouth every 6 (six) hours as needed for  nausea or vomiting.   simvastatin 20 MG tablet Commonly known as:  ZOCOR Take 20 mg by mouth at bedtime.   sodium bicarbonate 650 MG  tablet Take 650 mg by mouth 3 (three) times daily.   tacrolimus 1 MG capsule Commonly known as:  PROGRAF Take 3 mg by mouth 2 (two) times daily.   torsemide 20 MG tablet Commonly known as:  DEMADEX Take 4 tablets (80 mg total) by mouth 2 (two) times daily.       Follow-up Information    [name], MD. Schedule an appointment as soon as possible for a visit in 1 week(s).   Specialty:  Internal Medicine Contact information: [place] [address] [phone]        Donato Heinz, MD. Schedule an appointment as soon as possible for a visit in 1 week(s).   Specialty:  Nephrology Contact information: [place] [address] [phone]           Major procedures and Radiology Reports - PLEASE review detailed and final reports thoroughly  -         Dg Chest 2 View  Result Date: 03/18/2018 CLINICAL DATA:  Pt complains of shortness of breath and "fluid build-up" x 1 month but symptoms getting worse; pt reports hx of DM, HTN, and right kidney transplant in 2007 and states it is failing; smoker EXAM: CHEST - 2 VIEW COMPARISON:  03/01/2018 FINDINGS: Cardiac silhouette is mildly enlarged. There is thickening of the right paratracheal stripe similar to the most recent prior exam. This may be vascular. Adenopathy is possible. Mediastinal contours otherwise unremarkable. No hilar masses or convincing adenopathy. There is linear opacity in lung bases, most prominently on the right, consistent with atelectasis. Remainder of the lungs is clear. No pleural effusion or pneumothorax. Skeletal structures are intact. IMPRESSION: 1. No evidence of pneumonia pulmonary edema. 2. Lung base atelectasis, greater on the right. 3. Prominent right paratracheal stripe in the azygos region which could be due to adenopathy or be  vascular in origin. This is similar to the most recent prior exam. 4. Mild cardiomegaly. Electronically Signed   By: Lajean Manes M.D.   On: 03/18/2018 14:17   Ct Femur Left Wo Contrast  Result Date: 03/01/2018 CLINICAL DATA:  Bilateral leg pain for 2 days. EXAM: CT OF THE LOWER LEFT EXTREMITY WITHOUT CONTRAST TECHNIQUE: Multidetector CT imaging of the lower left extremity was performed according to the standard protocol. COMPARISON:  None. FINDINGS: Bones/Joint/Cartilage No fracture or dislocation. Normal alignment. No joint effusion. No periosteal reaction or bone destruction. No aggressive osseous lesion. Ligaments Ligaments are suboptimally evaluated by CT. Muscles and Tendons No muscle atrophy.  No intramuscular fluid collection or hematoma. Soft tissue No fluid collection or hematoma. No soft tissue mass. Soft tissue edema in the subcutaneous fat of left thigh with mild skin thickening. IMPRESSION: 1. Nonspecific soft tissue edema in the subcutaneous fat of the left thigh which may reflect cellulitis versus venous insufficiency. Electronically Signed   By: Kathreen Devoid   On: 03/01/2018 11:28   Dg Chest Port 1 View  Result Date: 03/20/2018 CLINICAL DATA:  End-stage renal disease, diatek placement EXAM: PORTABLE CHEST 1 VIEW COMPARISON:  03/18/2018 FINDINGS: Bilateral lower lung scarring/atelectasis. No focal consolidation. No frank interstitial edema. No pleural effusion or pneumothorax. The heart is top-normal in size. Right IJ dual lumen dialysis catheter terminating in the cavoatrial junction. IMPRESSION: Right IJ dual lumen dialysis catheter terminating in the cavoatrial junction. Electronically Signed   By: Julian Hy M.D.   On: 03/20/2018 11:44   Dg Chest Portable 1 View  Result Date: 03/01/2018 CLINICAL DATA:  Shortness of  breath and chest pain EXAM: PORTABLE CHEST 1 VIEW COMPARISON:  02/02/2018 FINDINGS: The heart size and mediastinal contours are within normal limits. Both lungs  are clear. The visualized skeletal structures are unremarkable. IMPRESSION: No active disease. Electronically Signed   By: Kathreen Devoid   On: 03/01/2018 10:48   Vas [place] Upper Extremity Arterial Duplex  Result Date: 02/27/2018 UPPER EXTREMITY DUPLEX STUDY Other Factors: History of kidney transplant Performing Technologist: Alvia Grove RVT  Examination Guidelines: A complete evaluation includes B-mode imaging, spectral Doppler, color Doppler, and power Doppler as needed of all accessible portions of each vessel. Bilateral testing is considered an integral part of a complete examination. Limited examinations for reoccurring indications may be performed as noted.  Right Pre-Dialysis Findings: +-----------------------+----------+--------------------+---------+--------+ Location               PSV (cm/s)Intralum. Diam. (cm)Waveform Comments +-----------------------+----------+--------------------+---------+--------+ Brachial Antecub. fossa66        0.43                triphasic         +-----------------------+----------+--------------------+---------+--------+ Radial Art at Wrist    56        0.21                triphasic         +-----------------------+----------+--------------------+---------+--------+ Ulnar Art at Wrist     49        0.14                triphasic         +-----------------------+----------+--------------------+---------+--------+ Left Pre-Dialysis Findings: +-----------------------+----------+--------------------+---------+--------+ Location               PSV (cm/s)Intralum. Diam. (cm)Waveform Comments +-----------------------+----------+--------------------+---------+--------+ Brachial Antecub. fossa58        0.42                triphasic         +-----------------------+----------+--------------------+---------+--------+ Radial Art at Wrist    51        0.23                triphasic          +-----------------------+----------+--------------------+---------+--------+ Ulnar Art at Wrist     44        0.19                triphasic         +-----------------------+----------+--------------------+---------+--------+ +------------+-----------------+ Allen's Testbranches at fossa +------------+-----------------+  Summary:   Measurements above. *See table(s) above for measurements and observations.    Preliminary    Vas [place] Lower Extremity Venous (dvt) (only [facility])  Result Date: 03/02/2018  Lower Venous Study Indications: Swelling, and Pain.  Performing Technologist: Abram Sander RVS  Examination Guidelines: A complete evaluation includes B-mode imaging, spectral Doppler, color Doppler, and power Doppler as needed of all accessible portions of each vessel. Bilateral testing is considered an integral part of a complete examination. Limited examinations for reoccurring indications may be performed as noted.  Right Venous Findings: +---------+---------------+---------+-----------+----------+--------------+          CompressibilityPhasicitySpontaneityPropertiesSummary        +---------+---------------+---------+-----------+----------+--------------+ CFV      Full           Yes      Yes                                 +---------+---------------+---------+-----------+----------+--------------+ SFJ  Full                                                        +---------+---------------+---------+-----------+----------+--------------+ FV Prox  Full                                                        +---------+---------------+---------+-----------+----------+--------------+ FV Mid   Full                                                        +---------+---------------+---------+-----------+----------+--------------+ FV DistalFull                                                         +---------+---------------+---------+-----------+----------+--------------+ PFV      Full                                                        +---------+---------------+---------+-----------+----------+--------------+ POP      Full           Yes      Yes                                 +---------+---------------+---------+-----------+----------+--------------+ PTV      Full                                                        +---------+---------------+---------+-----------+----------+--------------+ PERO                                                  Not visualized +---------+---------------+---------+-----------+----------+--------------+  Left Venous Findings: +---------+---------------+---------+-----------+----------+------------------+          CompressibilityPhasicitySpontaneityPropertiesSummary            +---------+---------------+---------+-----------+----------+------------------+ CFV      Full           Yes      Yes                                     +---------+---------------+---------+-----------+----------+------------------+ SFJ      Full                                                            +---------+---------------+---------+-----------+----------+------------------+  FV Prox  Full                                                            +---------+---------------+---------+-----------+----------+------------------+ FV Mid   Full                                                            +---------+---------------+---------+-----------+----------+------------------+ FV DistalFull                                                            +---------+---------------+---------+-----------+----------+------------------+ PFV      Full                                                            +---------+---------------+---------+-----------+----------+------------------+ POP      Full           Yes      Yes                                      +---------+---------------+---------+-----------+----------+------------------+ PTV      Full                                         limited                                                                  visualization      +---------+---------------+---------+-----------+----------+------------------+ PERO                                                  Not visualized     +---------+---------------+---------+-----------+----------+------------------+    Summary: Right: There is no evidence of deep vein thrombosis in the lower extremity. No cystic structure found in the popliteal fossa. Left: There is no evidence of deep vein thrombosis in the lower extremity. No cystic structure found in the popliteal fossa.  *See table(s) above for measurements and observations. Electronically signed by Harold Barban MD on 03/02/2018 at 7:53:28 AM.    Final    Vas [place] Upper Ext Vein Mapping (pre-op Avf)  Result Date: 03/09/2018 UPPER EXTREMITY DUPLEX STUDY Other Factors: History of kidney transplant Performing Technologist: Alvia Grove RVT  Examination Guidelines: A  complete evaluation includes B-mode imaging, spectral Doppler, color Doppler, and power Doppler as needed of all accessible portions of each vessel. Bilateral testing is considered an integral part of a complete examination. Limited examinations for reoccurring indications may be performed as noted.  Right Pre-Dialysis Findings: +-----------------------+----------+--------------------+---------+-------- + Location               PSV (cm/s)Intralum. Diam. (cm)Waveform Comments +-----------------------+----------+--------------------+---------+-------- + Brachial Antecub. fossa66        0.43                triphasic         +-----------------------+----------+--------------------+---------+-------- + Radial Art at Wrist    56        0.21                triphasic          +-----------------------+----------+--------------------+---------+-------- + Ulnar Art at Wrist     49        0.14                triphasic         +-----------------------+----------+--------------------+---------+-------- + Left Pre-Dialysis Findings: +-----------------------+----------+--------------------+---------+-------- + Location               PSV (cm/s)Intralum. Diam. (cm)Waveform Comments +-----------------------+----------+--------------------+---------+-------- + Brachial Antecub. fossa58        0.42                triphasic         +-----------------------+----------+--------------------+---------+-------- + Radial Art at Wrist    51        0.23                triphasic         +-----------------------+----------+--------------------+---------+-------- + Ulnar Art at Wrist     44        0.19                triphasic         +-----------------------+----------+--------------------+---------+-------- + +------------+-----------------+ Allen's Testbranches at fossa +------------+-----------------+  Summary:   Measurements above. *See table(s) above for measurements and observations.    Preliminary     Micro Results     No results found for this or any previous visit (from the past 240 hour(s)).  Today   Subjective    [name] today has no headache,no chest abdominal pain,no new weakness tingling or numbness, feels much better wants to go home today.     Objective   Blood pressure 165/90, pulse 89, temperature 98.7 F (37.1 C), resp. rate 19, height 5\' 6"  (1.676 m), weight 84.2 kg, SpO2 98 %.  No intake or output data in the 24 hours ending 03/24/18 1354  Exam  Awake Alert, Oriented x 3, No new F.N deficits, Normal affect .AT,PERRAL, R-IJ HD cath Supple Neck,No JVD, No cervical lymphadenopathy appriciated.  Symmetrical Chest wall movement, Good air movement bilaterally, CTAB RRR,No Gallops,Rubs or new Murmurs, No Parasternal Heave +ve  B.Sounds, Abd Soft, Non tender, No organomegaly appriciated, No rebound -guarding or rigidity. No Cyanosis, Clubbing or edema, No new Rash or bruise   Data Review   CBC w Diff:  Lab Results  Component Value Date   WBC 7.8 03/22/2018   HGB 8.5 (L) 03/22/2018   HCT 26.9 (L) 03/22/2018   PLT 204 03/22/2018   PLT 206 07/17/2009   LYMPHOPCT 20 03/20/2018   MONOPCT 7 03/20/2018   EOSPCT 4 03/20/2018   BASOPCT 0 03/20/2018    CMP:  Lab  Results  Component Value Date   NA 136 03/23/2018   K 3.8 03/23/2018   CL 100 03/23/2018   CO2 27 03/23/2018   BUN 34 (H) 03/23/2018   CREATININE 3.85 (H) 03/23/2018   PROT 6.2 (L) 03/19/2018   ALBUMIN 2.6 (L) 03/19/2018   BILITOT 0.6 03/19/2018   ALKPHOS 34 (L) 03/19/2018   AST 12 (L) 03/19/2018   ALT 11 03/20/2018    Total Time in preparing paper work, data evaluation and todays exam - 54 minutes  Lala Lund M.D on 03/24/2018 at [place]  [phone]

## 2018-03-24 NOTE — Progress Notes (Signed)
PROGRESS NOTE    [name]  QGB:[id number] DOB: 25-Sep-1985 DOA: 03/18/2018 PCP: [name], MD   Brief Narrative:   [name] is a 33 y.o. 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  Patient has been admitted here numerous times in the past.  Her last admission was in December when she presented with similar complaints.  She was diuresed with IV Lasix and was discharged to home.  Patient was admitted for the management of end-stage renal disease.  Nephrology consulted. She underwent right IJ tunneled dialysis catheter on 03/20/2018 by vascular surgery.  Dialysis  started on 03/20/2018.  Now Awaiting  finalization of  outpatient dialysis facility.She was dialyzed today.  Subjective: Patient in bed, appears comfortable, denies any headache, no fever, no chest pain or pressure, no shortness of breath , no abdominal pain. No focal weakness.  Assessment & Plan:    Acute kidney injury on CKD stage V:  She initially had underlying focal segmental glomera sclerosis, underwent renal transplant, was following with nephrologist Dr. Christean Grief.  Unfortunately her renal function has gradually continued to decline to the point that she is ESRD now.  She underwent a right IJ dialysis catheter placement on 03/20/2018 by vascular surgery and dialysis was initiated on 03/20/2018.  At this time renal problem is being followed and managed by nephrology.  Vascular surgery is also contemplating placing permanent access.  Outside CLI P process is underway, she was accepted to an outpatient dialysis unit but she did not want to go to that assigned unit, new unit search is underway.  Anemia of  chronic disease: Transfused with 1 unit of PRBC. Last Hb of 8.5 .  Continue to monitor CBC.  She has been transfused with Procrit in the past.  Status post kidney transplant: Underwent renal transplant in 2007.  She had failed AV graft on bilateral upper extremities in the past.  She follows with the kidney transplant team at transplant center in [place].  She was on mycophenolate, prednisone and tacrolimus at home which we will continue for now.  Most recent biopsy on 7/19 showed borderline cellular rejection on the background of transplant.  Bilateral lower extremity pain/peripheral neuropathy: Most likely her neuropathy is from her diabetes which is contributing to the bilateral lower extremity pain.  Continue gabapentin.  Needs glycemic control.  Hypocalcemia: She has  secondary hyperparathyroidism.  Supplemented with calcium.  Continue calcitriol.  Phosphorous of 8.4.  Continue PhosLo  History of diabetic gastroparesis: Denies any abdominal pain at present.  Continue Reglan as needed.  Hypertension: Currently on Coreg, poor control added Norvasc.  Will monitor and adjust as needed.  Anion gap metabolic acidosis: Due to renal failure.  Continue sodium bicarb tablets.  Chronic pain syndrome: Chronic  bilateral lower extremities pain.  She has history of chronic opiate use.  History of XXX syndrome/pseudoseizures/intellectual impairment: Continue supportive care  Diabetes mellitus type 1: On insulin.  Continue sliding scale and long acting  insulin.    Outpatient control will add pre-meal NovoLog for better control.  Lab Results  Component Value Date   HGBA1C 10.9 (H) 02/03/2018   CBG (last 3)  Recent Labs    03/23/18 1652 03/23/18 2114 03/24/18 0817  GLUCAP 146* 146*  148*     DVT prophylaxis:SCD- Heparin started on 03/23/2018 Code Status: Full Family Communication: Boy Friend at bedside on 03/24/2018 Disposition Plan: Home after finalization of outpatient dialysis  facility ,end of inpatient dialysis sessions, clearance from nephrology  Consultants: Nephrology  Procedures: None  Antimicrobials: None    Objective: Vitals:   03/23/18 1056 03/23/18 1406 03/23/18 2116 03/24/18 0440  BP: (!) 139/103 127/88 (!) 150/108 (!) 173/114  Pulse: (!) 101 94 (!) 101 97  Resp: 18 18 19 19   Temp: 98.1 F (36.7 C) 98.4 F (36.9 C) 99.1 F (37.3 C) 98.8 F (37.1 C)  TempSrc: Oral  Oral Oral  SpO2: 94% 98% 100% 95%  Weight: 84.2 kg     Height:        Intake/Output Summary (Last 24 hours) at 03/24/2018 0958 Last data filed at 03/23/2018 1056 Gross per 24 hour  Intake -  Output 2500 ml  Net -2500 ml   Filed Weights   03/22/18 1040 03/23/18 0711 03/23/18 1056  Weight: 87 kg 87 kg 84.2 kg    Examination:  Awake Alert, Oriented X 3, No new F.N deficits, Normal affect [place]ias.AT,PERRAL, right IJ HD catheter in place Supple Neck,No JVD, No cervical lymphadenopathy appriciated.  Symmetrical Chest wall movement, Good air movement bilaterally, CTAB RRR,No Gallops, Rubs or new Murmurs, No Parasternal Heave +ve B.Sounds, Abd Soft, No tenderness, No organomegaly appriciated, No rebound - guarding or rigidity. No Cyanosis, Clubbing or edema, No new Rash or bruise    Data Reviewed: I have personally reviewed following labs and imaging studies  CBC: Recent Labs  Lab 03/18/18 1313 03/19/18 0443 03/20/18 0459 03/22/18 1141  WBC 10.0 9.2 7.4 7.8  NEUTROABS 6.0  --  5.1  --   HGB 7.1* 6.7* 7.5* 8.5*  HCT 24.2* 22.9* 24.1* 26.9*  MCV 96.4 95.8 90.6 92.4  PLT 187 185 169 235   Basic Metabolic Panel: Recent Labs  Lab 03/19/18 0443 03/19/18 0454 03/20/18 0459 03/20/18 1915 03/21/18 0523 03/22/18 0329 03/23/18 0432  NA 137  --  134*  --  138 137 136  K 4.1  --  4.8  --  3.9 3.7 3.8  CL 110  --  107  --  102 102 100  CO2 14*  --  15*  --  24 25 27   GLUCOSE 198*  --  346*  --  391* 313* 340*  BUN 96*  --  101*  --  62* 69* 34*  CREATININE 7.61*  --   7.17*  --  5.28* 5.72* 3.85*  CALCIUM 4.9*  --  5.2* 5.7* 8.2* 8.0* 8.0*  PHOS  --  8.3* 8.4*  --   --  5.7* 3.2   GFR: Estimated Creatinine Clearance: 22.5 mL/min (A) (by C-G formula based on SCr of 3.85 mg/dL (H)). Liver Function Tests: Recent Labs  Lab 03/19/18 0443 03/20/18 1915  AST 12*  --   ALT 13 11  ALKPHOS 34*  --   BILITOT 0.6  --   PROT 6.2*  --   ALBUMIN 2.6*  --    No results for input(s): LIPASE, AMYLASE in the last 168 hours. No results for input(s): AMMONIA in the last 168 hours. Coagulation Profile: No results for input(s): INR, PROTIME in the last 168 hours. Cardiac Enzymes: No results for input(s): CKTOTAL, CKMB, CKMBINDEX, TROPONINI in the last 168 hours. BNP (last 3 results) No results for input(s): PROBNP in the last 8760 hours. HbA1C: No results for input(s): HGBA1C  in the last 72 hours. CBG: Recent Labs  Lab 03/22/18 2128 03/23/18 1231 03/23/18 1652 03/23/18 2114 03/24/18 0817  GLUCAP 343* 129* 146* 146* 148*   Lipid Profile: No results for input(s): CHOL, HDL, LDLCALC, TRIG, CHOLHDL, LDLDIRECT in the last 72 hours. Thyroid Function Tests: No results for input(s): TSH, T4TOTAL, FREET4, T3FREE, THYROIDAB in the last 72 hours. Anemia Panel: No results for input(s): VITAMINB12, FOLATE, FERRITIN, TIBC, IRON, RETICCTPCT in the last 72 hours. Sepsis Labs: No results for input(s): PROCALCITON, LATICACIDVEN in the last 168 hours.  No results found for this or any previous visit (from the past 240 hour(s)).   Radiology Studies: No results found.  Scheduled Meds: . amLODipine  10 mg Oral Daily  . calcitRIOL  1 mcg Oral Daily  . calcium acetate  2,001 mg Oral TID WC  . calcium carbonate  1 tablet Oral BID  . carvedilol  12.5 mg Oral BID WC  . Chlorhexidine Gluconate Cloth  6 each Topical Q0600  . darbepoetin (ARANESP) injection - DIALYSIS  60 mcg Intravenous Q Mon-HD  . famotidine  10 mg Oral Daily  . furosemide  80 mg Intravenous BID  .  gabapentin  300 mg Oral BID  . heparin injection (subcutaneous)  5,000 Units Subcutaneous Q8H  . insulin aspart  0-15 Units Subcutaneous TID WC  . insulin aspart  0-5 Units Subcutaneous QHS  . insulin aspart  12 Units Subcutaneous TID WC  . insulin glargine  65 Units Subcutaneous QHS  . mycophenolate  720 mg Oral BID  . pneumococcal 23 valent vaccine  0.5 mL Intramuscular Tomorrow-1000  . predniSONE  10 mg Oral Q breakfast  . simvastatin  20 mg Oral QHS  . sodium bicarbonate  650 mg Oral BID  . tacrolimus  3 mg Oral BID   Continuous Infusions: . sodium chloride Stopped (03/21/18 0405)  . calcium gluconate       LOS: 6 days    Time spent:25 mins. More than 50% of that time was spent in counseling and/or coordination of care.  Signature  Lala Lund M.D on 03/24/2018 at 9:58 AM   -  To page go to [email or web address]

## 2018-03-27 ENCOUNTER — Other Ambulatory Visit: Payer: Self-pay

## 2018-03-27 DIAGNOSIS — G9009 Other idiopathic peripheral autonomic neuropathy: Secondary | ICD-10-CM | POA: Insufficient documentation

## 2018-03-27 DIAGNOSIS — T829XXA Unspecified complication of cardiac and vascular prosthetic device, implant and graft, initial encounter: Secondary | ICD-10-CM | POA: Insufficient documentation

## 2018-03-27 DIAGNOSIS — Q963 Mosaicism, 45, X/46, XX or XY: Secondary | ICD-10-CM | POA: Insufficient documentation

## 2018-03-27 DIAGNOSIS — T8612 Kidney transplant failure: Secondary | ICD-10-CM | POA: Insufficient documentation

## 2018-03-27 DIAGNOSIS — N269 Renal sclerosis, unspecified: Secondary | ICD-10-CM | POA: Insufficient documentation

## 2018-03-27 DIAGNOSIS — Z992 Dependence on renal dialysis: Secondary | ICD-10-CM | POA: Insufficient documentation

## 2018-03-27 DIAGNOSIS — D689 Coagulation defect, unspecified: Secondary | ICD-10-CM | POA: Insufficient documentation

## 2018-03-27 NOTE — Patient Outreach (Signed)
[facility]) Care Management  03/27/2018  [name] 1985/05/29 [id number]   Received referral from [name] on 03-24-2018 for assessment of needs for [facility] calls. Member with recent hospitalization on 03-18-2018 to 03-24-2018 for BLE edema; renal failure; Kidney transplant. PMH: ESRD on HD at [facility] location; HTN; DM, type 1; Ovarian Failure; Obesity; Hx of Kidney transplantation recipient; Sleep walk d/o; HLD; XXX Syndrome; Learning Disability.  Called member at [employer] preferred number and no answer. HIPPA compliant voicemail message left requesting return call when available.  Per workflow, Unsuccessful Outreach Letter sent to member.   Will await return call from member. Will return call to member within 3-4 business days should member not return call.   [name] "[name]" Valerie Lobo, RN-BSN  [facility] Care Management  Community Care Management Coordinator  [phone] [name]

## 2018-03-28 ENCOUNTER — Other Ambulatory Visit: Payer: Self-pay

## 2018-03-28 NOTE — Patient Outreach (Signed)
[facility] [facility]) Care Management  03/28/2018  [name] Mar 04, 1986 [id number]   Social work referral for transportation resources received from [place], Eula Fried, on 03/27/18.   Successful outreach to [name] today.  [name] was unaware that she likely has transportation benefits through Medicaid.  BSW provided her with Medicaid Transportation number in order to confirm benefit and schedule transport.  BSW talked with [name] about SCAT services and offered to complete and submit application on her behalf.  [name] declined at this time due to cost.   BSW informed [name] that [facility], Thressa Sheller, attempted to contact her yesterday and provided her with contact information.   BSW encouraged [name] to call if she has any difficulty with Medicaid transportation.  BSW will follow up with her next week.  Ronn Melena, BSW Social Worker [phone]

## 2018-03-29 ENCOUNTER — Ambulatory Visit: Payer: Self-pay | Admitting: Pharmacist

## 2018-03-30 NOTE — Patient Outreach (Addendum)
[facility] [facility]) Care Management  03/30/2018  [name] 1985-04-15 [id number]   Successful Outreach Call:   Received referral from Hospital Liaison on 03-24-2018 for assessment of needs for [facility] calls. Member with recent hospitalization on 03-18-2018 to 03-24-2018 for BLE edema; renal failure; Kidney transplant. PMH: ESRD on HD at [facility] location; HTN; DM, type 1; Ovarian Failure; Obesity; Hx of Kidney transplantation recipient; Sleep walk d/o; HLD; XXX Syndrome; Learning Disability  Called member at [employer] preferred number and member answered phone. Introduced self and [place] explained to member. Member verbalized/confirmed that she would like to continue with [facility]; however, stated that she is unable to talk right now. Member stated she will return call around 12 noon today and member verbalized phone number [phone] to return call to.  Will await return call from member. Will call member within 1 week should member not return call today.  Valerie Mola "[name]" [name], RN-BSN  [facility] Care Management  Community Care Management Coordinator  [phone] [email or web address]

## 2018-03-30 NOTE — Progress Notes (Signed)
This encounter was created in error - please disregard.

## 2018-04-03 ENCOUNTER — Other Ambulatory Visit: Payer: Self-pay

## 2018-04-03 DIAGNOSIS — D509 Iron deficiency anemia, unspecified: Secondary | ICD-10-CM | POA: Insufficient documentation

## 2018-04-03 NOTE — Patient Outreach (Signed)
[place]) Care Management  04/03/2018  [name] November 18, 1985 [id number]   Received referral from Hospital Liaison on 03-24-2018 for assessment of needs for [facility] calls. Member with recent hospitalization on 03-18-2018 to 03-24-2018 for BLE edema; renal failure; Kidney transplant. Member has 5 ED-Hospital admissions in past 6 months. PMH: ESRD on HD at [facility] location; HTN; DM, type 1; Ovarian Failure; Obesity; Hx of Kidney transplantation recipient; Sleep walk d/o; HLD; XXX Syndrome; Learning Disability.  Called member at [employer] preferred number and member answered phone. HIPPA identifiers verified. Introduced self to member and [name] explained to member. Member verbalized agreement with services; however, states that she is in process of going to Hemodialysis today. Member verbalized agreement with home visit on tomorrow.   Member stated she has fluid restrictions of 1.5 Liters/day and states that she has 2 bottles that she measures her fluid with. Member states she has been receiving HD treatments for 2 week now via [place] [facility] [place] MWF and states she feel tired and sleepy afterwards.   Will follow up tomorrow for home visit with member.  [name] "Valerie" [name], RN-BSN  [facility] Care Management  Community Care Management Coordinator  [phone] [name]

## 2018-04-04 ENCOUNTER — Other Ambulatory Visit: Payer: Self-pay

## 2018-04-04 NOTE — Patient Outreach (Signed)
[place] [facility]) Care Management  04/04/2018  [name] 12/16/1985 [id number]   Received referral from Hospital Liaison on 03-24-2018 for assessment of needs for [facility] calls. Member with recent hospitalization on 03-18-2018 to 03-24-2018 for BLE edema; renal failure; Kidney transplant. Member has 5 ED-Hospital admissions in past 6 months. PMH: ESRD on HD at [name] location; HTN; DM, type 1; Ovarian Failure; Obesity; Hx of Kidney transplantation recipient; Sleep walk d/o; HLD; XXX Syndrome; Learning Disability.  TOC Initial Home visit scheduled for today; however, received call from member and HIPPA identifiers verified. Member stated she has been called to MD office to complete paperwork for possible kidney transplantation.   Member stated she has hemodialysis on MWF. Member requested to reschedule home visit and verbalized agreement with Tuesday 04-11-2018.   Will follow up with member next week.  Valerie Mola "[name]" [name], RN-BSN  [facility] Care Management  Community Care Management Coordinator  [phone] [email or web address]

## 2018-04-04 NOTE — Patient Outreach (Signed)
[facility]) Care Management  04/04/2018  [name] Mar 09, 1986 [id number]   Follow up call to [name] regarding social work referral for transportation resources.  [name] was able to contact Medicaid Transportation and confirm that she has this benefit and can utilize it for medical transport.  BSW is closing case at this time but encouraged [name] to call if she changes her mind about applying for SCAT services or if additional needs arise.  Ronn Melena, BSW Social Worker [phone]

## 2018-04-11 ENCOUNTER — Other Ambulatory Visit: Payer: Self-pay

## 2018-04-11 NOTE — Patient Outreach (Addendum)
[place] [facility]) Care Management  04/11/2018  [name] [birth year] [id number]   Received referral from Hospital Liaison on 03-24-2018 for assessment of needs for [facility] calls. Member with recent hospitalization on 03-18-2018 to 03-24-2018 for BLE edema; renal failure; Kidney transplant. PMH: ESRD on HD at [facility] location; HTN; DM, type 1; Ovarian Failure; Obesity; Hx of Kidney transplantation recipient; Sleep walk d/o; HLD; XXX Syndrome; Learning Disability.  Called member at [employer] preferred number and member answered phone. HIPPA verifiers confirmed. Member confirmed appointment scheduled for Initial Home Visit for today. Member states she is currently in process of assisting with the care of her father and hopes to be done by the scheduled appointment time.  Will follow up with member later today for Initial Home Visit.  Valerie Mola "[name]" [name], RN-BSN  [facility] Care Management  Community Care Management Coordinator  [phone] [email or web address]

## 2018-04-12 NOTE — Patient Outreach (Signed)
[place]) Care Management  04/12/2018  KAYLYNN CHAMBLIN January 30, 1986 [id number]   Traveled to member's home and member did not answer the door. Member called and left voice mail message apologizing and stated that she would like to reschedule home visit.   Will follow up with member in 3-4 business days.  [name] "[name]" [name], RN-BSN  [facility] Care Management  Community Care Management Coordinator  [phone] [email or web address]

## 2018-04-17 ENCOUNTER — Other Ambulatory Visit: Payer: Self-pay

## 2018-04-17 NOTE — Patient Outreach (Signed)
[place] [facility]) Care Management  04/17/2018  [name] 1985-06-07 [id number]  Received referral from [place] on 03-24-2018 for assessment of needs for [facility] calls. Member with recent hospitalization on 03-18-2018 to 03-24-2018 for BLE edema; renal failure; Kidney transplant. PMH: ESRD on HD at [facility] location; HTN; DM, type 1; Ovarian Failure; Obesity; Hx of Kidney transplantation recipient; Sleep walk d/o; HLD; XXX Syndrome; Learning Disability.  Called member at [employer] preferred number and member answered phone. HIPPA verifiers confirmed. Member states that her phone needs to be charged currently; however, verbalizes agreement for initial telephone assessment later today.   Will call member later this afternoon per member's request.  Benjamine MolaANN" [name], RN-BSN  [place] Management Coordinator  [phone] [email or web address]

## 2018-04-17 NOTE — Patient Outreach (Signed)
[facility]) Care Management  04/17/2018  [name] March 07, 1986 [id number]   Returned call to member and HIPPA identifiers verified. Member stated that she is currently unable to complete telephone assessment due to she has had a "bad day today". Member stated that she was scheduled for hemodialysis today; however, she canceled appointment due to she also had an appointment scheduled with [facility] regarding her kidney. Member stated that "[facility] canceled my [facility] appointment but they did not tell me". Member stated that when she arrived to [facility], she did not know where to go and no one assisted her and she experienced frustration.   Member verbalized preference for Initial home visit on Wednesday versus tomorrow due to she will have to make up for hemodialysis on tomorrow.   Will follow up with member at home on Wednesday for initial home visit.   [name] "[name]" [name], RN-BSN  [facility] Care Management  Community Care Management Coordinator  [phone] [email or web address]

## 2018-04-19 ENCOUNTER — Other Ambulatory Visit: Payer: Self-pay

## 2018-04-19 DIAGNOSIS — Z23 Encounter for immunization: Secondary | ICD-10-CM | POA: Insufficient documentation

## 2018-04-20 DIAGNOSIS — R0602 Shortness of breath: Secondary | ICD-10-CM | POA: Insufficient documentation

## 2018-04-20 NOTE — Patient Outreach (Signed)
[facility]) Care Management  [place]  04-19-2018   MALISSA SLAY 06-Dec-1985 [id number]  Subjective: 33 year old female.   Objective: BP 122/68   Pulse (!) 119   Resp 20   Ht 1.651 m (5\' 5" )   Wt 182 lb (82.6 kg)   SpO2 96%   BMI 30.29 kg/m   Encounter Medications:  Outpatient Encounter Medications as of 04/19/2018  Medication Sig Note  . ACCU-CHEK SOFTCLIX LANCETS lancets Use to check blood sugar 4 times per day.   Marland Kitchen acetaminophen (TYLENOL) 500 MG tablet Take 1,000 mg by mouth daily as needed for moderate pain.    Marland Kitchen amLODipine (NORVASC) 10 MG tablet Take 1 tablet (10 mg total) by mouth daily.   . B Complex-C-Folic Acid (B COMPLEX-VITAMIN C-FOLIC ACID) 1 MG tablet Take 1 tablet by mouth daily with breakfast.   . calcitRIOL (ROCALTROL) 0.25 MCG capsule Take 0.25 mcg by mouth daily.   . calcium acetate (PHOSLO) 667 MG capsule Take 1,334 mg by mouth 3 (three) times daily with meals.   . Calcium Carb-Cholecalciferol (CALCIUM 1000 + D PO) Take 1 tablet by mouth daily.    . carvedilol (COREG) 12.5 MG tablet Take 12.5 mg by mouth 2 (two) times daily with a meal. 03/01/2018: Patient unsure of exact time  . dicyclomine (BENTYL) 20 MG tablet Take 1 tablet (20 mg total) by mouth 2 (two) times daily.   . fluticasone (FLONASE) 50 MCG/ACT nasal spray Place 2 sprays into both nostrils daily. (Patient taking differently: Place 2 sprays into both nostrils daily as needed for allergies. )   . gabapentin (NEURONTIN) 300 MG capsule Take 300 mg by mouth 3 (three) times daily.   . hydrALAZINE (APRESOLINE) 50 MG tablet Take 1 tablet (50 mg total) by mouth every 8 (eight) hours.   . hydrOXYzine (ATARAX/VISTARIL) 50 MG tablet Take 1 tablet (50 mg total) by mouth 2 (two) times daily as needed for itching.   . insulin aspart (NOVOLOG FLEXPEN) 100 UNIT/ML FlexPen Inject 12-15 Units into the skin See admin instructions. 5-6 times per day as needed   . insulin degludec (TRESIBA) 100  UNIT/ML SOPN FlexTouch Pen Inject 0.6 mLs (60 Units total) into the skin 2 (two) times daily. Reports taking 24 units QAM   . loperamide (IMODIUM) 2 MG capsule Take 2 mg by mouth as needed for diarrhea or loose stools.    . metoCLOPramide (REGLAN) 10 MG tablet Take 1 tablet (10 mg total) by mouth 3 (three) times daily before meals.   . mycophenolate (MYFORTIC) 360 MG TBEC Take 720 mg by mouth 2 (two) times daily.    . ondansetron (ZOFRAN ODT) 4 MG disintegrating tablet Take 1 tablet (4 mg total) by mouth every 4 (four) hours as needed for nausea or vomiting.   . pantoprazole (PROTONIX) 40 MG tablet Take 40 mg by mouth 2 (two) times daily.   . predniSONE (DELTASONE) 10 MG tablet Take 1 tablet (10 mg total) by mouth every morning.   . promethazine (PHENERGAN) 25 MG tablet Take 1 tablet (25 mg total) by mouth every 6 (six) hours as needed for nausea or vomiting.   . simvastatin (ZOCOR) 20 MG tablet Take 20 mg by mouth at bedtime.    . tacrolimus (PROGRAF) 1 MG capsule Take 3 mg by mouth 2 (two) times daily.    . calcium elemental as carbonate (BARIATRIC TUMS ULTRA) 400 MG chewable tablet Chew 1,000 mg by mouth 3 (three) times daily  as needed for heartburn.    . famotidine (PEPCID) 10 MG tablet Take 10 mg by mouth daily.   . sodium bicarbonate 650 MG tablet Take 650 mg by mouth 3 (three) times daily.   Marland Kitchen torsemide (DEMADEX) 20 MG tablet Take 4 tablets (80 mg total) by mouth 2 (two) times daily. (Patient not taking: Reported on 04/19/2018)    No facility-administered encounter medications on file as of 04/19/2018.     Functional Status:  In your present state of health, do you have any difficulty performing the following activities: 03/22/2018 03/01/2018  Hearing? Y N  Vision? N N  Difficulty concentrating or making decisions? Y N  Comment "somewhat" -  Walking or climbing stairs? N N  Dressing or bathing? N N  Doing errands, shopping? N N  Some recent data might be hidden    Fall/Depression  Screening: Fall Risk  04/19/2018 03/06/2015 02/03/2015  Falls in the past year? 0 No No  Number falls in past yr: 0 - -    Assessment:  Received referral from [place] on 03-24-2018 for assessment of needs for [facility] calls. Member with recent hospitalization on 03-18-2018 to 03-24-2018 for BLE edema; renal failure; Kidney transplant.Member has 5 ED-Hospital admissions in past 6 months.PMH: ESRD on HD at [facility] location; HTN; DM, type 1; Ovarian Failure; Obesity; Hx of Kidney transplantation recipient; Sleep walk d/o; HLD; XXX Syndrome; Learning Disability.  TOC Initial Home visit today. Member unable to complete all of initial assessment due to member scheduled again today for hemodialysis treatment and transportation in process of arriving.   Transportation: Member denies concerns for transportation and states that she has transportation as a benefit of her insurance.  THN Benefits explained to member and member verbalized understanding and acceptance of services. Provided member with [facility] calendar, packet and RN CM [facility] business card. Member states she has a [facility] magnet already on her refrigerator. Member made aware to call this RN CM with any concerns and assistance needed.  Assessed member's knowledge of when to call MD/911 for emergency and member responded appropriately.   THN CM Care Plan Problem One     Most Recent Value  Care Plan Problem One  Knowledge Deficit related to CKD and treatment of HD   Role Documenting the Problem One  Care Management [place] for Problem One  Active  (Pended)   THN Long Term Goal   Member will take medications related to Chronic Kidney Failure as ordered for next 45 days as manifested by member's report of compliance  THN Long Term Goal Start Date  04/19/18  Interventions for Problem One Long Term Goal  Reviewed medications, home visit today  THN CM Short Term Goal #1   Member will attend hemodialysis appointments MWF as  scheduled for next 30 days  THN CM Short Term Goal #1 Start Date  04/19/18  Interventions for Short Term Goal #1  Discussed scheduled hemodialysis appointments,  Assessed for transportation concerns  [facility] CM Short Term Goal #2   Member will schedule follow up PCP appointment  [facility] CM Short Term Goal #2 Start Date  04/19/18  Interventions for Short Term Goal #2  discussed and reinforced information on discharge summary with member      Plan: Will follow up with member next week to complete initial assessment.   [name] "[name]" [name], RN-BSN  [facility] Care Management  Community Care Management Coordinator  [phone] [email or web address]

## 2018-04-24 ENCOUNTER — Other Ambulatory Visit: Payer: Self-pay

## 2018-04-24 ENCOUNTER — Other Ambulatory Visit (HOSPITAL_COMMUNITY): Payer: Self-pay

## 2018-04-24 NOTE — Patient Outreach (Signed)
[facility] [facility]) Care Management  04/24/2018  [name] 01-22-1986 [id number]  Received referral from Hospital Liaison on 03-24-2018 for assessment of needs for [facility] calls. Member with hospitalization on 03-18-2018 to 03-24-2018 for BLE edema; renal failure; Kidney transplant.Member has 5 ED-Hospital admissions in past 6 months.PMH: ESRD on HD at [facility] location; HTN; DM, type 1; Ovarian Failure; Obesity; Hx of Kidney transplantation recipient; Sleep walk d/o; HLD; XXX Syndrome; Learning Disability.  Called member at [employer] preferred number in order to complete initial assessment; however, no answer. HIPPA compliant voice mail message left requesting return call when available.  Will follow with member within 3-4 business days.  [name] "[name]" [name], RN-BSN  [facility] Care Management  Community Care Management Coordinator  [phone] [email or web address]

## 2018-04-25 ENCOUNTER — Ambulatory Visit (HOSPITAL_COMMUNITY)
Admission: RE | Admit: 2018-04-25 | Discharge: 2018-04-25 | Disposition: A | Discharge status: Home / Self Care | Payer: Medicare Other | Source: Ambulatory Visit | Attending: Nephrology | Admitting: Nephrology

## 2018-04-25 ENCOUNTER — Ambulatory Visit: Payer: Self-pay

## 2018-04-25 ENCOUNTER — Ambulatory Visit: Payer: Medicare Other | Admitting: Vascular Surgery

## 2018-04-25 DIAGNOSIS — N186 End stage renal disease: Secondary | ICD-10-CM | POA: Diagnosis not present

## 2018-04-25 DIAGNOSIS — D631 Anemia in chronic kidney disease: Secondary | ICD-10-CM | POA: Insufficient documentation

## 2018-04-25 LAB — PREPARE RBC (CROSSMATCH)

## 2018-04-25 MED ORDER — ACETAMINOPHEN 325 MG PO TABS
ORAL_TABLET | ORAL | Status: AC
  Administered 2018-04-25: 325 mg
  Filled 2018-04-25: qty 1

## 2018-04-25 MED ORDER — SODIUM CHLORIDE 0.9% IV SOLUTION: Freq: Once | INTRAVENOUS | Status: DC

## 2018-04-25 MED ORDER — ACETAMINOPHEN 325 MG PO TABS: 325.0000 mg | ORAL_TABLET | Freq: Once | ORAL | Status: DC

## 2018-04-26 LAB — BPAM RBC
Blood Product Expiration Date: 202002292359
ISSUE DATE / TIME: 202002111004
Unit Type and Rh: 6200

## 2018-04-26 LAB — TYPE AND SCREEN
ABO/RH(D): A POS
Antibody Screen: NEGATIVE
Unit division: 0

## 2018-04-27 ENCOUNTER — Other Ambulatory Visit: Payer: Self-pay

## 2018-04-27 NOTE — Patient Outreach (Addendum)
[facility]) Care Management  04/27/2018  TANEYA CONKEL 1985/09/12 [id number]  2nd Unsuccessful Outreach   Called member at [employer] preferred numberin order to complete initial assessment; however, no answer. HIPPA compliant voice mail message left requesting return call when available. Attempt to call member on non-hemodialysis day.  Will send Unsuccessful Outreach Letter and Physician Barriers Letter . Will follow with member within 3-4 business days.  [name] "[name]" [name], RN-BSN  [facility] Care Management  Community Care Management Coordinator  [phone] [email or web address]

## 2018-05-01 ENCOUNTER — Emergency Department (HOSPITAL_COMMUNITY): Payer: Medicare Other

## 2018-05-01 ENCOUNTER — Other Ambulatory Visit: Payer: Self-pay

## 2018-05-01 ENCOUNTER — Encounter (HOSPITAL_COMMUNITY): Payer: Self-pay | Admitting: Emergency Medicine

## 2018-05-01 ENCOUNTER — Observation Stay (HOSPITAL_COMMUNITY)
Admission: EM | Admit: 2018-05-01 | Discharge: 2018-05-02 | Disposition: A | Discharge status: Home / Self Care | Payer: Medicare Other | Attending: Internal Medicine | Admitting: Internal Medicine

## 2018-05-01 DIAGNOSIS — Z794 Long term (current) use of insulin: Secondary | ICD-10-CM | POA: Diagnosis not present

## 2018-05-01 DIAGNOSIS — Z886 Allergy status to analgesic agent status: Secondary | ICD-10-CM | POA: Diagnosis not present

## 2018-05-01 DIAGNOSIS — E871 Hypo-osmolality and hyponatremia: Secondary | ICD-10-CM

## 2018-05-01 DIAGNOSIS — E1010 Type 1 diabetes mellitus with ketoacidosis without coma: Secondary | ICD-10-CM | POA: Diagnosis not present

## 2018-05-01 DIAGNOSIS — F445 Conversion disorder with seizures or convulsions: Secondary | ICD-10-CM

## 2018-05-01 DIAGNOSIS — E1022 Type 1 diabetes mellitus with diabetic chronic kidney disease: Secondary | ICD-10-CM | POA: Diagnosis not present

## 2018-05-01 DIAGNOSIS — E785 Hyperlipidemia, unspecified: Secondary | ICD-10-CM | POA: Diagnosis not present

## 2018-05-01 DIAGNOSIS — R569 Unspecified convulsions: Secondary | ICD-10-CM

## 2018-05-01 DIAGNOSIS — E1310 Other specified diabetes mellitus with ketoacidosis without coma: Secondary | ICD-10-CM

## 2018-05-01 DIAGNOSIS — I10 Essential (primary) hypertension: Secondary | ICD-10-CM | POA: Diagnosis present

## 2018-05-01 DIAGNOSIS — D849 Immunodeficiency, unspecified: Secondary | ICD-10-CM | POA: Diagnosis present

## 2018-05-01 DIAGNOSIS — Z881 Allergy status to other antibiotic agents status: Secondary | ICD-10-CM | POA: Diagnosis not present

## 2018-05-01 DIAGNOSIS — D631 Anemia in chronic kidney disease: Secondary | ICD-10-CM | POA: Diagnosis not present

## 2018-05-01 DIAGNOSIS — N189 Chronic kidney disease, unspecified: Secondary | ICD-10-CM | POA: Diagnosis present

## 2018-05-01 DIAGNOSIS — D899 Disorder involving the immune mechanism, unspecified: Secondary | ICD-10-CM

## 2018-05-01 DIAGNOSIS — Z79899 Other long term (current) drug therapy: Secondary | ICD-10-CM | POA: Insufficient documentation

## 2018-05-01 DIAGNOSIS — Z940 Kidney transplant status: Secondary | ICD-10-CM

## 2018-05-01 DIAGNOSIS — I120 Hypertensive chronic kidney disease with stage 5 chronic kidney disease or end stage renal disease: Secondary | ICD-10-CM | POA: Diagnosis not present

## 2018-05-01 DIAGNOSIS — Z888 Allergy status to other drugs, medicaments and biological substances status: Secondary | ICD-10-CM | POA: Insufficient documentation

## 2018-05-01 DIAGNOSIS — F79 Unspecified intellectual disabilities: Secondary | ICD-10-CM | POA: Insufficient documentation

## 2018-05-01 DIAGNOSIS — Z992 Dependence on renal dialysis: Secondary | ICD-10-CM | POA: Diagnosis not present

## 2018-05-01 DIAGNOSIS — N186 End stage renal disease: Secondary | ICD-10-CM

## 2018-05-01 DIAGNOSIS — E1110 Type 2 diabetes mellitus with ketoacidosis without coma: Secondary | ICD-10-CM | POA: Diagnosis present

## 2018-05-01 LAB — CBC
HCT: 27.3 % — ABNORMAL LOW (ref 36.0–46.0)
Hemoglobin: 8.3 g/dL — ABNORMAL LOW (ref 12.0–15.0)
MCH: 28 pg (ref 26.0–34.0)
MCHC: 30.4 g/dL (ref 30.0–36.0)
MCV: 92.2 fL (ref 80.0–100.0)
PLATELETS: 124 10*3/uL — AB (ref 150–400)
RBC: 2.96 MIL/uL — ABNORMAL LOW (ref 3.87–5.11)
RDW: 14.3 % (ref 11.5–15.5)
WBC: 7.6 10*3/uL (ref 4.0–10.5)
nRBC: 0 % (ref 0.0–0.2)

## 2018-05-01 LAB — CBG MONITORING, ED
GLUCOSE-CAPILLARY: 475 mg/dL — AB (ref 70–99)
Glucose-Capillary: >600 mg/dL (ref 70–99)
Glucose-Capillary: >600 mg/dL (ref 70–99)
Glucose-Capillary: >600 mg/dL (ref 70–99)
Glucose-Capillary: >600 mg/dL (ref 70–99)
Glucose-Capillary: 227 mg/dL — ABNORMAL HIGH (ref 70–99)

## 2018-05-01 LAB — MRSA PCR SCREENING: MRSA by PCR: NEGATIVE

## 2018-05-01 LAB — BASIC METABOLIC PANEL
Anion gap: 18 — ABNORMAL HIGH (ref 5–15)
Anion gap: 14 (ref 5–15)
BUN: 50 mg/dL — ABNORMAL HIGH (ref 6–20)
BUN: 51 mg/dL — ABNORMAL HIGH (ref 6–20)
CALCIUM: 6.6 mg/dL — AB (ref 8.9–10.3)
CHLORIDE: 102 mmol/L (ref 98–111)
CO2: 15 mmol/L — ABNORMAL LOW (ref 22–32)
CO2: 20 mmol/L — ABNORMAL LOW (ref 22–32)
Calcium: 6.9 mg/dL — ABNORMAL LOW (ref 8.9–10.3)
Chloride: 95 mmol/L — ABNORMAL LOW (ref 98–111)
Creatinine, Ser: 6.28 mg/dL — ABNORMAL HIGH (ref 0.44–1.00)
Creatinine, Ser: 6.07 mg/dL — ABNORMAL HIGH (ref 0.44–1.00)
GFR calc Af Amer: 9 mL/min — ABNORMAL LOW (ref >60)
GFR calc non Af Amer: 8 mL/min — ABNORMAL LOW (ref >60)
GFR, EST AFRICAN AMERICAN: 10 mL/min — AB (ref >60)
GFR, EST NON AFRICAN AMERICAN: 8 mL/min — AB (ref >60)
Glucose, Bld: 981 mg/dL (ref 70–99)
Glucose, Bld: 165 mg/dL — ABNORMAL HIGH (ref 70–99)
Potassium: 4.2 mmol/L (ref 3.5–5.1)
Potassium: 3.1 mmol/L — ABNORMAL LOW (ref 3.5–5.1)
Sodium: 128 mmol/L — ABNORMAL LOW (ref 135–145)
Sodium: 136 mmol/L (ref 135–145)

## 2018-05-01 LAB — GLUCOSE, CAPILLARY
GLUCOSE-CAPILLARY: 52 mg/dL — AB (ref 70–99)
Glucose-Capillary: 97 mg/dL (ref 70–99)
Glucose-Capillary: 91 mg/dL (ref 70–99)
Glucose-Capillary: 136 mg/dL — ABNORMAL HIGH (ref 70–99)
Glucose-Capillary: 173 mg/dL — ABNORMAL HIGH (ref 70–99)

## 2018-05-01 LAB — BRAIN NATRIURETIC PEPTIDE: B NATRIURETIC PEPTIDE 5: 433.7 pg/mL — AB (ref 0.0–100.0)

## 2018-05-01 LAB — POCT I-STAT EG7
Acid-base deficit: 8 mmol/L — ABNORMAL HIGH (ref 0.0–2.0)
Bicarbonate: 18.6 mmol/L — ABNORMAL LOW (ref 20.0–28.0)
Calcium, Ion: 0.82 mmol/L — CL (ref 1.15–1.40)
HCT: 24 % — ABNORMAL LOW (ref 36.0–46.0)
Hemoglobin: 8.2 g/dL — ABNORMAL LOW (ref 12.0–15.0)
O2 Saturation: 97 %
Potassium: 4.5 mmol/L (ref 3.5–5.1)
Sodium: 128 mmol/L — ABNORMAL LOW (ref 135–145)
TCO2: 20 mmol/L — AB (ref 22–32)
pCO2, Ven: 39.4 mmHg — ABNORMAL LOW (ref 44.0–60.0)
pH, Ven: 7.281 (ref 7.250–7.430)
pO2, Ven: 99 mmHg — ABNORMAL HIGH (ref 32.0–45.0)

## 2018-05-01 LAB — I-STAT BETA HCG BLOOD, ED (MC, WL, AP ONLY): I-stat hCG, quantitative: 5.1 m[IU]/mL — ABNORMAL HIGH (ref <5)

## 2018-05-01 MED ORDER — NEPRO/CARBSTEADY PO LIQD: 237.0000 mL | Freq: Three times a day (TID) | ORAL | Status: DC | PRN

## 2018-05-01 MED ORDER — HYDROXYZINE HCL 25 MG PO TABS: 25.0000 mg | ORAL_TABLET | Freq: Three times a day (TID) | ORAL | Status: DC | PRN

## 2018-05-01 MED ORDER — INSULIN DEGLUDEC 100 UNIT/ML ~~LOC~~ SOPN: 60.0000 [IU] | PEN_INJECTOR | Freq: Two times a day (BID) | SUBCUTANEOUS | Status: DC

## 2018-05-01 MED ORDER — CALCIUM CARBONATE ANTACID 1250 MG/5ML PO SUSP
500.0000 mg | Freq: Four times a day (QID) | ORAL | Status: DC | PRN
  Filled 2018-05-01: qty 5

## 2018-05-01 MED ORDER — SORBITOL 70 % SOLN
30.0000 mL | Status: DC | PRN
  Filled 2018-05-01: qty 30

## 2018-05-01 MED ORDER — INSULIN REGULAR(HUMAN) IN NACL 100-0.9 UT/100ML-% IV SOLN
INTRAVENOUS | Status: DC
  Administered 2018-05-01: 5.4 [IU]/h via INTRAVENOUS
  Administered 2018-05-01: 10.8 [IU]/h via INTRAVENOUS
  Filled 2018-05-01: qty 100

## 2018-05-01 MED ORDER — METOCLOPRAMIDE HCL 10 MG PO TABS: 10.0000 mg | ORAL_TABLET | Freq: Three times a day (TID) | ORAL | Status: DC

## 2018-05-01 MED ORDER — FAMOTIDINE 20 MG PO TABS
10.0000 mg | ORAL_TABLET | Freq: Every day | ORAL | Status: DC
  Administered 2018-05-02: 10 mg via ORAL
  Filled 2018-05-01: qty 1

## 2018-05-01 MED ORDER — MYCOPHENOLATE SODIUM 180 MG PO TBEC
720.0000 mg | DELAYED_RELEASE_TABLET | Freq: Two times a day (BID) | ORAL | Status: DC
  Administered 2018-05-01 – 2018-05-02 (×2): 720 mg via ORAL
  Filled 2018-05-01 (×2): qty 4

## 2018-05-01 MED ORDER — DEXTROSE-NACL 5-0.45 % IV SOLN
INTRAVENOUS | Status: DC
  Administered 2018-05-01: 17:00:00 via INTRAVENOUS

## 2018-05-01 MED ORDER — AMLODIPINE BESYLATE 5 MG PO TABS
10.0000 mg | ORAL_TABLET | Freq: Every day | ORAL | Status: DC
  Administered 2018-05-02: 10 mg via ORAL
  Filled 2018-05-01: qty 2

## 2018-05-01 MED ORDER — TACROLIMUS 1 MG PO CAPS
3.0000 mg | ORAL_CAPSULE | Freq: Two times a day (BID) | ORAL | Status: DC
  Administered 2018-05-01 – 2018-05-02 (×2): 3 mg via ORAL
  Filled 2018-05-01 (×2): qty 3

## 2018-05-01 MED ORDER — CHLORHEXIDINE GLUCONATE 0.12 % MT SOLN
15.0000 mL | Freq: Two times a day (BID) | OROMUCOSAL | Status: DC
  Administered 2018-05-02: 15 mL via OROMUCOSAL
  Filled 2018-05-01: qty 15

## 2018-05-01 MED ORDER — DEXTROSE 50 % IV SOLN
INTRAVENOUS | Status: AC
  Administered 2018-05-01: 19 mL
  Filled 2018-05-01: qty 50

## 2018-05-01 MED ORDER — ZOLPIDEM TARTRATE 5 MG PO TABS: 5.0000 mg | ORAL_TABLET | Freq: Every evening | ORAL | Status: DC | PRN

## 2018-05-01 MED ORDER — ENOXAPARIN SODIUM 30 MG/0.3ML ~~LOC~~ SOLN
30.0000 mg | SUBCUTANEOUS | Status: DC
  Administered 2018-05-01: 30 mg via SUBCUTANEOUS
  Filled 2018-05-01: qty 0.3

## 2018-05-01 MED ORDER — ACETAMINOPHEN 650 MG RE SUPP: 650.0000 mg | Freq: Four times a day (QID) | RECTAL | Status: DC | PRN

## 2018-05-01 MED ORDER — CALCITRIOL 0.25 MCG PO CAPS
0.2500 ug | ORAL_CAPSULE | Freq: Every day | ORAL | Status: DC
  Administered 2018-05-02: 0.25 ug via ORAL
  Filled 2018-05-01: qty 1

## 2018-05-01 MED ORDER — LORAZEPAM 2 MG/ML IJ SOLN: 1.0000 mg | INTRAMUSCULAR | Status: DC | PRN

## 2018-05-01 MED ORDER — DEXTROSE-NACL 5-0.45 % IV SOLN: INTRAVENOUS | Status: DC

## 2018-05-01 MED ORDER — CHLORHEXIDINE GLUCONATE CLOTH 2 % EX PADS
6.0000 | MEDICATED_PAD | Freq: Every day | CUTANEOUS | Status: DC
  Administered 2018-05-02: 6 via TOPICAL

## 2018-05-01 MED ORDER — ONDANSETRON HCL 4 MG PO TABS: 4.0000 mg | ORAL_TABLET | Freq: Four times a day (QID) | ORAL | Status: DC | PRN

## 2018-05-01 MED ORDER — CALCIUM ACETATE (PHOS BINDER) 667 MG PO CAPS
1334.0000 mg | ORAL_CAPSULE | Freq: Three times a day (TID) | ORAL | Status: DC
  Administered 2018-05-01 – 2018-05-02 (×2): 1334 mg via ORAL
  Filled 2018-05-01 (×3): qty 2

## 2018-05-01 MED ORDER — HYDRALAZINE HCL 25 MG PO TABS
50.0000 mg | ORAL_TABLET | Freq: Three times a day (TID) | ORAL | Status: DC
  Administered 2018-05-02: 50 mg via ORAL
  Filled 2018-05-01: qty 2

## 2018-05-01 MED ORDER — GABAPENTIN 300 MG PO CAPS
300.0000 mg | ORAL_CAPSULE | Freq: Three times a day (TID) | ORAL | Status: DC
  Administered 2018-05-01 – 2018-05-02 (×2): 300 mg via ORAL
  Filled 2018-05-01 (×2): qty 1

## 2018-05-01 MED ORDER — SODIUM BICARBONATE 650 MG PO TABS
650.0000 mg | ORAL_TABLET | Freq: Three times a day (TID) | ORAL | Status: DC
  Administered 2018-05-01 – 2018-05-02 (×2): 650 mg via ORAL
  Filled 2018-05-01 (×2): qty 1

## 2018-05-01 MED ORDER — CARVEDILOL 12.5 MG PO TABS
12.5000 mg | ORAL_TABLET | Freq: Two times a day (BID) | ORAL | Status: DC
  Administered 2018-05-01 – 2018-05-02 (×2): 12.5 mg via ORAL
  Filled 2018-05-01 (×2): qty 1

## 2018-05-01 MED ORDER — DOCUSATE SODIUM 283 MG RE ENEM
1.0000 | ENEMA | RECTAL | Status: DC | PRN
  Filled 2018-05-01: qty 1

## 2018-05-01 MED ORDER — PANTOPRAZOLE SODIUM 40 MG PO TBEC
40.0000 mg | DELAYED_RELEASE_TABLET | Freq: Two times a day (BID) | ORAL | Status: DC
  Administered 2018-05-01 – 2018-05-02 (×2): 40 mg via ORAL
  Filled 2018-05-01 (×2): qty 1

## 2018-05-01 MED ORDER — DICYCLOMINE HCL 20 MG PO TABS
20.0000 mg | ORAL_TABLET | Freq: Two times a day (BID) | ORAL | Status: DC
  Administered 2018-05-01 – 2018-05-02 (×2): 20 mg via ORAL
  Filled 2018-05-01 (×2): qty 1

## 2018-05-01 MED ORDER — ACETAMINOPHEN 325 MG PO TABS: 650.0000 mg | ORAL_TABLET | Freq: Four times a day (QID) | ORAL | Status: DC | PRN

## 2018-05-01 MED ORDER — SIMVASTATIN 20 MG PO TABS: 20.0000 mg | ORAL_TABLET | Freq: Every day | ORAL | Status: DC

## 2018-05-01 MED ORDER — CAMPHOR-MENTHOL 0.5-0.5 % EX LOTN: 1.0000 "application " | TOPICAL_LOTION | Freq: Three times a day (TID) | CUTANEOUS | Status: DC | PRN

## 2018-05-01 MED ORDER — PREDNISONE 20 MG PO TABS
10.0000 mg | ORAL_TABLET | Freq: Every day | ORAL | Status: DC
  Administered 2018-05-02: 10 mg via ORAL
  Filled 2018-05-01: qty 1

## 2018-05-01 MED ORDER — ORAL CARE MOUTH RINSE: 15.0000 mL | Freq: Two times a day (BID) | OROMUCOSAL | Status: DC

## 2018-05-01 MED ORDER — ONDANSETRON HCL 4 MG/2ML IJ SOLN: 4.0000 mg | Freq: Four times a day (QID) | INTRAMUSCULAR | Status: DC | PRN

## 2018-05-01 MED ORDER — SODIUM CHLORIDE 0.9 % IV SOLN: INTRAVENOUS | Status: DC

## 2018-05-01 MED ORDER — INSULIN REGULAR(HUMAN) IN NACL 100-0.9 UT/100ML-% IV SOLN: INTRAVENOUS | Status: DC

## 2018-05-01 MED ORDER — LORAZEPAM 2 MG/ML IJ SOLN
1.0000 mg | Freq: Once | INTRAMUSCULAR | Status: AC
  Administered 2018-05-01: 1 mg via INTRAVENOUS
  Filled 2018-05-01: qty 1

## 2018-05-01 NOTE — ED Provider Notes (Signed)
[facility] EMERGENCY DEPARTMENT Provider Note   CSN: [id number] Arrival date & time:        History   Chief Complaint Chief Complaint  Patient presents with  . Spasms  . Seizures    HPI [name] is a 33 y.o. female with a past medical history of CKD, HTN, HLD, pseudoseizures, status post renal transplant several years ago presents to ED for right arm spasming.  Majority of history is provided by EMS.  They witnessed 2 focal seizures and she was given 5 mg of IM Versed.  Patient arousable on my exam but unable to obtain history at this time.  She does not have a known seizure disorder but was diagnosed with pseudoseizures.  She is not on any antiepileptics.  She receives dialysis Monday, Wednesday, Friday and is scheduled today.  She has not had insulin in about a week.   HPI  Past Medical History:  Diagnosis Date  . Chronic kidney disease   . Diabetes mellitus    Pt reports diagnosis in June 2011  . Hyperlipidemia   . Hypertension   . Kidney transplant recipient   . 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 Pyelonephritis 06/23/2014  . UTI (urinary tract infection) 01/09/2015  . 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 Renal failure 03/18/2018  . Cellulitis of left leg 03/01/2018  . Hyperlipidemia 03/01/2018  . Hypocalcemia 03/01/2018  . Metabolic acidosis 50/53/9767  . Hypokalemia 02/03/2018  . Acute renal failure superimposed on stage 4 chronic kidney disease ([place]) 02/03/2018  . Incontinence of bowel 02/03/2018  . Chronic pain 11/11/2017  . Gastroparesis   . AKI (acute kidney injury) ([place]) 07/13/2017  . Constipation 07/13/2017  . Hematemesis 07/02/2017  . Chronic cholecystitis 06/29/2017  . Type 1 diabetes mellitus with complication, uncontrolled ([place]) 05/25/2015  . Nausea &  vomiting 01/09/2015  . Acute renal failure superimposed on stage 3 chronic kidney disease ([place]) 01/09/2015  . Renal transplant recipient   . Immunosuppressed status ([place])   . 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 Chronic kidney disease 01/04/2010  . OVARIAN FAILURE, PREMATURE 03/11/2009  . 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 OBESITY 09/25/2007  . Anemia due to chronic kidney disease 09/25/2007  . LEARNING DISABILITY 09/25/2007  . Essential hypertension, benign 09/25/2007  . KIDNEY TRANSPLANTATION, HX OF 09/25/2007    Past Surgical History:  Procedure Laterality Date  . ARTERIOVENOUS GRAFT PLACEMENT Bilateral    "neither work" (10/24/2017)  . CHOLECYSTECTOMY N/A 06/30/2017   Procedure: LAPAROSCOPIC CHOLECYSTECTOMY WITH INTRAOPERATIVE CHOLANGIOGRAM;  Surgeon: Excell Seltzer, MD;  Location: [facility];  Service: General;  Laterality: N/A;  . ESOPHAGOGASTRODUODENOSCOPY (EGD) WITH PROPOFOL N/A 07/04/2017   Procedure: ESOPHAGOGASTRODUODENOSCOPY (EGD) WITH PROPOFOL;  Surgeon: Clarene Essex, MD;  Location: [facility];  Service: Endoscopy;  Laterality: N/A;  . INSERTION OF DIALYSIS CATHETER N/A 03/20/2018   Procedure: INSERTION OF TUNNELED DIALYSIS CATHETER - RIGHT INTERANL JUGULAR PLACEMENT;  Surgeon: Angelia Mould, MD;  Location: [facility];  Service: Vascular;  Laterality: N/A;  . KIDNEY TRANSPLANT  2007  . PARATHYROIDECTOMY  ?2012   "3/4 removed" (10/24/2017)  . RENAL BIOPSY Bilateral 2003     OB History   No obstetric history on file.  Home Medications    Prior to Admission medications   Medication Sig Start Date End Date Taking? Authorizing Provider  acetaminophen (TYLENOL) 500 MG tablet Take 1,000 mg by mouth daily as needed for moderate pain.    Yes [provider]  calcitRIOL (ROCALTROL) 0.25 MCG capsule Take 0.25 mcg by mouth daily.   Yes [provider]  calcium acetate  (PHOSLO) 667 MG capsule Take 1,334 mg by mouth 3 (three) times daily with meals.   Yes [provider]  Calcium Carb-Cholecalciferol (CALCIUM 1000 + D PO) Take 1 tablet by mouth daily.    Yes [provider]  calcium elemental as carbonate (BARIATRIC TUMS ULTRA) 400 MG chewable tablet Chew 1,000 mg by mouth 3 (three) times daily as needed for heartburn.    Yes [provider]  carvedilol (COREG) 12.5 MG tablet Take 12.5 mg by mouth 2 (two) times daily with a meal.   Yes [provider]  dicyclomine (BENTYL) 20 MG tablet Take 1 tablet (20 mg total) by mouth 2 (two) times daily. 02/06/18  Yes [name], Ripudeep K, MD  famotidine (PEPCID) 10 MG tablet Take 10 mg by mouth daily.   Yes [provider]  fluticasone (FLONASE) 50 MCG/ACT nasal spray Place 2 sprays into both nostrils daily. Patient taking differently: Place 2 sprays into both nostrils daily as needed for allergies.  07/20/15  Yes Muthersbaugh, Jarrett Soho, PA-C  gabapentin (NEURONTIN) 300 MG capsule Take 300 mg by mouth 3 (three) times daily.   Yes [provider]  hydrOXYzine (ATARAX/VISTARIL) 50 MG tablet Take 1 tablet (50 mg total) by mouth 2 (two) times daily as needed for itching. 02/06/18  Yes [name], Ripudeep K, MD  insulin aspart (NOVOLOG FLEXPEN) 100 UNIT/ML FlexPen Inject 12-15 Units into the skin See admin instructions. 5-6 times per day as needed   Yes [provider]  insulin degludec (TRESIBA) 100 UNIT/ML SOPN FlexTouch Pen Inject 0.6 mLs (60 Units total) into the skin 2 (two) times daily. Reports taking 24 units QAM 03/06/18  Yes Charlynne Cousins, MD  loperamide (IMODIUM) 2 MG capsule Take 2 mg by mouth as needed for diarrhea or loose stools.    Yes [provider]  metoCLOPramide (REGLAN) 10 MG tablet Take 1 tablet (10 mg total) by mouth 3 (three) times daily before meals. 02/06/18  Yes [name], Ripudeep K, MD  mycophenolate (MYFORTIC) 360 MG TBEC Take 720 mg by mouth 2  (two) times daily.    Yes [provider]  ondansetron (ZOFRAN ODT) 4 MG disintegrating tablet Take 1 tablet (4 mg total) by mouth every 4 (four) hours as needed for nausea or vomiting. 10/26/17  Yes Emokpae, Courage, MD  promethazine (PHENERGAN) 25 MG tablet Take 1 tablet (25 mg total) by mouth every 6 (six) hours as needed for nausea or vomiting. 02/06/18  Yes [name], Ripudeep K, MD  simvastatin (ZOCOR) 20 MG tablet Take 20 mg by mouth at bedtime.    Yes [provider]  sodium bicarbonate 650 MG tablet Take 650 mg by mouth 3 (three) times daily.   Yes [provider]  tacrolimus (PROGRAF) 1 MG capsule Take 3 mg by mouth 2 (two) times daily.    Yes [provider]  torsemide (DEMADEX) 20 MG tablet Take 4 tablets (80 mg total) by mouth 2 (two) times daily. 03/05/18  Yes Charlynne Cousins, MD  ACCU-CHEK SOFTCLIX LANCETS lancets Use to check blood sugar 4 times per day. 12/29/15   Renato Shin,  MD  amLODipine (NORVASC) 10 MG tablet Take 1 tablet (10 mg total) by mouth daily. 03/25/18   Thurnell Lose, MD  B Complex-C-Folic Acid (B COMPLEX-VITAMIN C-FOLIC ACID) 1 MG tablet Take 1 tablet by mouth daily with breakfast.    [provider]  hydrALAZINE (APRESOLINE) 50 MG tablet Take 1 tablet (50 mg total) by mouth every 8 (eight) hours. 03/24/18   Thurnell Lose, MD  pantoprazole (PROTONIX) 40 MG tablet Take 40 mg by mouth 2 (two) times daily.    [provider]  predniSONE (DELTASONE) 10 MG tablet Take 1 tablet (10 mg total) by mouth every morning. 03/06/18   Charlynne Cousins, MD    Family History Family History  Problem Relation Age of Onset  . Arthritis Mother   . Diabetes Mother   . Hypertension Mother     Social History Social History   Tobacco Use  . Smoking status: Never Smoker  . Smokeless tobacco: Never Used  Substance Use Topics  . Alcohol use: No  . Drug use: No     Allergies   Benadryl [diphenhydramine-zinc  acetate]; Motrin [ibuprofen]; Banana; Diphenhydramine hcl; Doxycycline; Iron dextran; and Shellfish allergy   Review of Systems Review of Systems  Unable to perform ROS: Mental status change     Physical Exam Updated Vital Signs BP (!) 181/97   Pulse 100   Temp 98.4 F (36.9 C) (Oral)   Resp (!) 22   SpO2 97%   Physical Exam Vitals signs and nursing note reviewed.  Constitutional:      General: She is not in acute distress.    Appearance: She is well-developed.  HENT:     Head: Normocephalic and atraumatic.     Nose: Nose normal.  Eyes:     General: No scleral icterus.       Left eye: No discharge.     Conjunctiva/sclera: Conjunctivae normal.  Neck:     Musculoskeletal: Normal range of motion and neck supple.  Cardiovascular:     Rate and Rhythm: Regular rhythm. Tachycardia present.     Heart sounds: Normal heart sounds. No murmur. No friction rub. No gallop.   Pulmonary:     Effort: Pulmonary effort is normal. No respiratory distress.     Breath sounds: Normal breath sounds.  Abdominal:     General: Bowel sounds are normal. There is no distension.     Palpations: Abdomen is soft.     Tenderness: There is no abdominal tenderness. There is no guarding.  Musculoskeletal: Normal range of motion.     Comments: Intermittent RUE spasms, distractable.  Trace pitting edema in bilateral lower extremities.  Skin:    General: Skin is warm and dry.     Findings: No rash.  Neurological:     Mental Status: She is alert.     Motor: No abnormal muscle tone.     Coordination: Coordination normal.      ED Treatments / Results  Labs (all labs ordered are listed, but only abnormal results are displayed) Labs Reviewed  BASIC METABOLIC PANEL - Abnormal; Notable for the following components:      Result Value   Sodium 128 (*)    Chloride 95 (*)    CO2 15 (*)    Glucose, Bld 981 (*)    BUN 50 (*)    Creatinine, Ser 6.28 (*)    Calcium 6.6 (*)    GFR calc non Af Amer 8 (*)     GFR  calc Af Amer 9 (*)    Anion gap 18 (*)    All other components within normal limits  CBC - Abnormal; Notable for the following components:   RBC 2.96 (*)    Hemoglobin 8.3 (*)    HCT 27.3 (*)    Platelets 124 (*)    All other components within normal limits  BRAIN NATRIURETIC PEPTIDE - Abnormal; Notable for the following components:   B Natriuretic Peptide 433.7 (*)    All other components within normal limits  CBG MONITORING, ED - Abnormal; Notable for the following components:   Glucose-Capillary >600 (*)    All other components within normal limits  I-STAT BETA HCG BLOOD, ED ([facility], [facility], [facility] ONLY) - Abnormal; Notable for the following components:   I-stat hCG, quantitative 5.1 (*)    All other components within normal limits  POCT I-STAT EG7 - Abnormal; Notable for the following components:   pCO2, Ven 39.4 (*)    pO2, Ven 99.0 (*)    Bicarbonate 18.6 (*)    TCO2 20 (*)    Acid-base deficit 8.0 (*)    Sodium 128 (*)    Calcium, Ion 0.82 (*)    HCT 24.0 (*)    Hemoglobin 8.2 (*)    All other components within normal limits  CBG MONITORING, ED - Abnormal; Notable for the following components:   Glucose-Capillary >600 (*)    All other components within normal limits  CBG MONITORING, ED - Abnormal; Notable for the following components:   Glucose-Capillary >600 (*)    All other components within normal limits    EKG EKG Interpretation  Date/Time:  Monday May 01 2018 09:57:07 EST Ventricular Rate:  108 PR Interval:    QRS Duration: 82 QT Interval:  367 QTC Calculation: 492 R Axis:   31 Text Interpretation:  Sinus tachycardia Probable left atrial enlargement Borderline prolonged QT interval No significant change since last tracing Confirmed by Duffy Bruce [phone]) on 05/01/2018 12:42:21 PM   Radiology Dg Chest 1 View  Result Date: 05/01/2018 CLINICAL DATA:  Seizures. Pt stated that she's had multiple seizures since they started yesterday. Pt displayed a mini  or possible mild seizure during exam. Her eyes didn't roll back and she was able to tell it was coming but she began slightly convulsing and stopped after a couple of seconds. Hx; XXXSyndrome (2010), pseudoseizures, HTN, DM EXAM: CHEST  1 VIEW COMPARISON:  03/20/2018 FINDINGS: Cardiac silhouette top-normal in size. No mediastinal or hilar masses. Right internal jugular tunneled dual lumen central venous catheter is stable, tip at the caval atrial junction. Lungs show vascular congestion and interstitial. No focal lung consolidation to suggest pneumonia. No convincing pleural effusion.  No pneumothorax. Skeletal structures are grossly intact. IMPRESSION: 1. Interstitial thickening suggests interstitial edema which may be on the basis failure or fluid overload. 2. No evidence of pneumonia. Electronically Signed   By: Lajean Manes M.D.   On: 05/01/2018 12:03   Ct Head Wo Contrast  Result Date: 05/01/2018 CLINICAL DATA:  Seizures. No apparent injury. EXAM: CT HEAD WITHOUT CONTRAST TECHNIQUE: Contiguous axial images were obtained from the base of the skull through the vertex without intravenous contrast. COMPARISON:  07/20/2015. FINDINGS: Brain: No evidence for acute infarction, hemorrhage, mass lesion, hydrocephalus, or extra-axial fluid. Premature for age atrophy. No definite white matter disease. Vascular: No hyperdense vessel or unexpected calcification. Skull: Normal. Negative for fracture or focal lesion. Sinuses/Orbits: No acute finding. Other: None. IMPRESSION: Premature for age atrophy. No  acute intracranial findings. Progression of atrophy since 2017. Electronically Signed   By: Staci Righter M.D.   On: 05/01/2018 11:14    Procedures Procedures (including critical care time)  CRITICAL CARE Performed by: Delia Heady   Total critical care time: 45 minutes  Critical care time was exclusive of separately billable procedures and treating other patients.  Critical care was necessary to treat or  prevent imminent or life-threatening deterioration.  Critical care was time spent personally by me on the following activities: development of treatment plan with patient and/or surrogate as well as nursing, discussions with consultants, evaluation of patient's response to treatment, examination of patient, obtaining history from patient or surrogate, ordering and performing treatments and interventions, ordering and review of laboratory studies, ordering and review of radiographic studies, pulse oximetry and re-evaluation of patient's condition.  Medications Ordered in ED Medications  dextrose 5 %-0.45 % sodium chloride infusion ( Intravenous Paused 05/01/18 1223)  insulin regular, human (MYXREDLIN) 100 units/ 100 mL infusion (10.8 Units/hr Intravenous New Bag/Given 05/01/18 1302)  LORazepam (ATIVAN) injection 1 mg (1 mg Intravenous Given 05/01/18 1220)     Initial Impression / Assessment and Plan / ED Course  I have reviewed the triage vital signs and the nursing notes.  Pertinent labs & imaging results that were available during my care of the patient were reviewed by me and considered in my medical decision making (see chart for details).  Clinical Course as of May 01 1320  Mon May 01, 2018  1013 Glucose(!!): 981 [HK]  1013 Anion gap(!): 18 [HK]  1248 Started on glucose stabilizer.   [HK]  1311 I talked to nephrologist who is aware of patient.   [HK]    Clinical Course User Index [HK] Delia Heady, PA-C   33 year old female with a past medical history of ESRD on dialysis MWF, insulin-dependent diabetes presents to ED for muscle spasms in the right arm.  EMS was called for this and noted that she had 2 focal seizures.  She was given 5 mg of Versed IM in route.  Drowsy upon my evaluation initially.  She has since become more alert.  Her right upper extremity spasms are distractible and only present when I am present.  Apparently she has not had insulin for about a week as she lost her  glucometer.  Glucose of 91 here on BMP.  Anion gap of 18.  CT of the head is negative.  Chest x-ray shows fluid overload possibly.  EKG shows no significant changes since prior tracing.  Hyponatremia of 128, creatinine of 6.2 but potassium is normal.  Hemoglobin is at baseline.  Patient started on glucose stabilizer will need to be admitted to stepdown.  I have made nephrology aware of patient.   Portions of this note were generated with Lobbyist. Dictation errors may occur despite best attempts at proofreading.  Final Clinical Impressions(s) / ED Diagnoses   Final diagnoses:  Diabetic ketoacidosis without coma associated with other specified diabetes mellitus ([place])  Hyponatremia  ESRD (end stage renal disease) [facility])    ED Discharge Orders    None       Delia Heady, PA-C 05/01/18 1322    Duffy Bruce, MD 05/01/18 1514
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
## 2018-05-01 NOTE — Progress Notes (Signed)
Hypoglycemic Event  CBG: 52  Treatment: D50 55ml per glucostablizer   Symptoms: sleepy  Follow-up CBG: Time:2001 CBG Result:97  Possible Reasons for Event: Other: pt is on glucostablizer and NPO  Comments/MD notified:Notified Schorr, NP that pt is A&Ox2 and very sleepy, anion gap is 14 now. Schorr, NP changed IVF to D51/2NS@125cc /hr. Will continue to monitor pt.     Candace Gallus

## 2018-05-01 NOTE — Patient Outreach (Signed)
[name]) Care Management  05/01/2018  [name] 10-20-1985 [id number]  3rd Unsuccessful Outreach   Called member at [employer] preferred numberin order to complete initial assessment; however, no answer. HIPPAcompliant voice mail message left requesting return call when available. Attempt to call member on non-hemodialysis day.  Per workflow, will place member on hold for next 10 business days.Will await return call from member.   [name] "[name]" [name], RN-BSN  [facility] Care Management  Community Care Management Coordinator  [phone] [email or web address]

## 2018-05-01 NOTE — ED Triage Notes (Signed)
Patient presents to the ED by EMS with c/o muscle spasms in the right arm since this morning, on scene patient had 2 focal seizures per EMS. Given 5 mg in route.  Pt alert to stimulation and responds to pain. She received dialysis on Friday and is scheduled for today. Swarm in progress.
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
## 2018-05-02 ENCOUNTER — Other Ambulatory Visit: Payer: Self-pay

## 2018-05-02 DIAGNOSIS — I10 Essential (primary) hypertension: Secondary | ICD-10-CM | POA: Diagnosis not present

## 2018-05-02 DIAGNOSIS — E1310 Other specified diabetes mellitus with ketoacidosis without coma: Secondary | ICD-10-CM

## 2018-05-02 DIAGNOSIS — E785 Hyperlipidemia, unspecified: Secondary | ICD-10-CM

## 2018-05-02 DIAGNOSIS — E1010 Type 1 diabetes mellitus with ketoacidosis without coma: Secondary | ICD-10-CM | POA: Diagnosis not present

## 2018-05-02 DIAGNOSIS — N186 End stage renal disease: Secondary | ICD-10-CM | POA: Diagnosis not present

## 2018-05-02 LAB — GLUCOSE, CAPILLARY
GLUCOSE-CAPILLARY: 112 mg/dL — AB (ref 70–99)
Glucose-Capillary: 124 mg/dL — ABNORMAL HIGH (ref 70–99)
Glucose-Capillary: 149 mg/dL — ABNORMAL HIGH (ref 70–99)
Glucose-Capillary: 182 mg/dL — ABNORMAL HIGH (ref 70–99)
Glucose-Capillary: 195 mg/dL — ABNORMAL HIGH (ref 70–99)
Glucose-Capillary: 334 mg/dL — ABNORMAL HIGH (ref 70–99)
Glucose-Capillary: 92 mg/dL (ref 70–99)
Glucose-Capillary: 96 mg/dL (ref 70–99)

## 2018-05-02 LAB — CBC
HCT: 24.8 % — ABNORMAL LOW (ref 36.0–46.0)
Hemoglobin: 8 g/dL — ABNORMAL LOW (ref 12.0–15.0)
MCH: 27.7 pg (ref 26.0–34.0)
MCHC: 32.3 g/dL (ref 30.0–36.0)
MCV: 85.8 fL (ref 80.0–100.0)
Platelets: 123 10*3/uL — ABNORMAL LOW (ref 150–400)
RBC: 2.89 MIL/uL — AB (ref 3.87–5.11)
RDW: 14.2 % (ref 11.5–15.5)
WBC: 6.8 10*3/uL (ref 4.0–10.5)
nRBC: 0 % (ref 0.0–0.2)

## 2018-05-02 LAB — BASIC METABOLIC PANEL
ANION GAP: 11 (ref 5–15)
Anion gap: 13 (ref 5–15)
BUN: 51 mg/dL — ABNORMAL HIGH (ref 6–20)
BUN: 13 mg/dL (ref 6–20)
CO2: 20 mmol/L — ABNORMAL LOW (ref 22–32)
CO2: 25 mmol/L (ref 22–32)
Calcium: 6.5 mg/dL — ABNORMAL LOW (ref 8.9–10.3)
Calcium: 6.8 mg/dL — ABNORMAL LOW (ref 8.9–10.3)
Chloride: 102 mmol/L (ref 98–111)
Chloride: 100 mmol/L (ref 98–111)
Creatinine, Ser: 6.03 mg/dL — ABNORMAL HIGH (ref 0.44–1.00)
Creatinine, Ser: 2.68 mg/dL — ABNORMAL HIGH (ref 0.44–1.00)
GFR calc Af Amer: 10 mL/min — ABNORMAL LOW (ref >60)
GFR calc Af Amer: 26 mL/min — ABNORMAL LOW (ref >60)
GFR calc non Af Amer: 8 mL/min — ABNORMAL LOW (ref >60)
GFR, EST NON AFRICAN AMERICAN: 22 mL/min — AB (ref >60)
Glucose, Bld: 224 mg/dL — ABNORMAL HIGH (ref 70–99)
Glucose, Bld: 176 mg/dL — ABNORMAL HIGH (ref 70–99)
Potassium: 3.4 mmol/L — ABNORMAL LOW (ref 3.5–5.1)
Potassium: 3.3 mmol/L — ABNORMAL LOW (ref 3.5–5.1)
Sodium: 135 mmol/L (ref 135–145)
Sodium: 136 mmol/L (ref 135–145)

## 2018-05-02 MED ORDER — ALTEPLASE 2 MG IJ SOLR
2.0000 mg | Freq: Once | INTRAMUSCULAR | Status: DC | PRN
  Filled 2018-05-02: qty 2

## 2018-05-02 MED ORDER — LIDOCAINE-PRILOCAINE 2.5-2.5 % EX CREA
1.0000 "application " | TOPICAL_CREAM | CUTANEOUS | Status: DC | PRN
  Filled 2018-05-02: qty 5

## 2018-05-02 MED ORDER — SODIUM CHLORIDE 0.9 % IV SOLN: 100.0000 mL | INTRAVENOUS | Status: DC | PRN

## 2018-05-02 MED ORDER — CHLORHEXIDINE GLUCONATE CLOTH 2 % EX PADS: 6.0000 | MEDICATED_PAD | Freq: Every day | CUTANEOUS | Status: DC

## 2018-05-02 MED ORDER — LIDOCAINE HCL (PF) 1 % IJ SOLN
5.0000 mL | INTRAMUSCULAR | Status: DC | PRN
  Filled 2018-05-02: qty 5

## 2018-05-02 MED ORDER — INSULIN ASPART 100 UNIT/ML ~~LOC~~ SOLN: 0.0000 [IU] | Freq: Every day | SUBCUTANEOUS | Status: DC

## 2018-05-02 MED ORDER — DEXTROSE 10 % IV SOLN
INTRAVENOUS | Status: DC
  Administered 2018-05-02: 01:00:00 via INTRAVENOUS

## 2018-05-02 MED ORDER — PENTAFLUOROPROP-TETRAFLUOROETH EX AERO: 1.0000 "application " | INHALATION_SPRAY | CUTANEOUS | Status: DC | PRN

## 2018-05-02 MED ORDER — INSULIN GLARGINE 100 UNIT/ML ~~LOC~~ SOLN
30.0000 [IU] | Freq: Every day | SUBCUTANEOUS | Status: DC
  Administered 2018-05-02: 30 [IU] via SUBCUTANEOUS
  Filled 2018-05-02 (×3): qty 0.3

## 2018-05-02 MED ORDER — INSULIN ASPART 100 UNIT/ML ~~LOC~~ SOLN
0.0000 [IU] | Freq: Three times a day (TID) | SUBCUTANEOUS | Status: DC
  Administered 2018-05-02: 2 [IU] via SUBCUTANEOUS
  Administered 2018-05-02: 7 [IU] via SUBCUTANEOUS

## 2018-05-02 MED ORDER — HEPARIN SODIUM (PORCINE) 1000 UNIT/ML DIALYSIS
1000.0000 [IU] | INTRAMUSCULAR | Status: DC | PRN
  Administered 2018-05-02: 1000 [IU] via INTRAVENOUS_CENTRAL
  Filled 2018-05-02 (×2): qty 1

## 2018-05-02 NOTE — Progress Notes (Signed)
Pt returned back to pt's room from dialysis by transport. Called telemetry and verified that pt is back on telemetry. Pt is resting comfortably. Will continue to monitor pt. Ranelle Oyster, RN

## 2018-05-02 NOTE — Discharge Summary (Signed)
OANH DEVIVO OHF:[id number] DOB: [birth year] DOA: 05/01/2018  PCP: [name], MD  Admit date: 05/01/2018  Discharge date: 05/02/2018  Admitted From: Home   Disposition:  Home   Recommendations for Outpatient Follow-up:   Follow up with PCP in 1-2 weeks  PCP Please obtain BMP/CBC, 2 view CXR in 1week,  (see Discharge instructions)   PCP Please follow up on the following pending results:    Home Health: None   Equipment/Devices: None  Consultations: None Discharge Condition: Stable   CODE STATUS: Full   Diet Recommendation: Renal Low Carb    Chief Complaint  Patient presents with  . Spasms  . Seizures     Brief history of present illness from the day of admission and additional interim summary    [name] is a 33 y.o. female with medical history significant of pseudoseizures; intellectual disability; ESRD on HD s/p renal transplantation; HTN; and HLD presenting with spasms/seizures.  Her brother was there to take her to HD and she called from the bathroom and couldn't get off the toilet.  She started having spasms that "looked like a seizure."  He called 911.  She was found to have DKA and possible spasms vs pseudoseizures.                                                                 Hospital Course    DKA in a DM type II patient.  Due to missed insulin dose, she has prescriptions at home, counseled on compliance, was treated with DKA protocol now DKA has resolved.  Back to baseline, commence home regimen and discharge home.  Pseudoseizures.  Likely something similar in the ER, also had DKA with metabolic agement at that point, with resolution of DKA and HD she is back to her baseline.  I will still have her follow with neurology outpatient.  Told her not to drive till cleared by neurologist from  pseudoseizures/seizure standpoint.  I do not think she had a true seizure.  ESRD.  Needed was consulted she was dialyzed.  HX of hypertension dyslipidemia.  Home medications continued.  Discharge diagnosis     Principal Problem:   DKA (diabetic ketoacidosis) ([place]) Active Problems:   Anemia due to chronic kidney disease   Essential hypertension, benign   Pseudoseizures   Renal transplant recipient   Immunosuppressed status ([place])   Hyperlipidemia   ESRD on hemodialysis [facility])    Discharge instructions    Discharge Instructions    Discharge instructions   Complete by:  As directed    Do not drive until you have been cleared by the neurologist to do so next visit.    Follow with Primary MD [name], MD in 7 days   Get CBC, BMP, A1c checked  by Primary MD  in 5-7 days    Activity: As tolerated with Full fall precautions use walker/cane & assistance as needed  Disposition Home    Diet: Renal, 1.5 lit/day fluid restriction. CBGs QAC-HS  Special Instructions: If you have smoked or chewed Tobacco  in the last 2 yrs please stop smoking, stop any regular Alcohol  and or any Recreational drug use.  On your next visit with your primary care physician please Get Medicines reviewed and adjusted.  Please request your Prim.MD to go over all Hospital Tests and Procedure/Radiological results at the follow up, please get all Hospital records sent to your Prim MD by signing hospital release before you go home.  If you experience worsening of your admission symptoms, develop shortness of breath, life threatening emergency, suicidal or homicidal thoughts you must seek medical attention immediately by calling 911 or calling your MD immediately  if symptoms less severe.  You Must read complete instructions/literature along with all the possible adverse reactions/side effects for all the Medicines you take and that have been prescribed to you. Take any new Medicines after you have  completely understood and accpet all the possible adverse reactions/side effects.   Increase activity slowly   Complete by:  As directed       Discharge Medications   Allergies as of 05/02/2018      Reactions   Benadryl [diphenhydramine-zinc Acetate] Shortness Of Breath   Motrin [ibuprofen] Shortness Of Breath, Itching   Per pt   Banana Other (See Comments)   Sick on the stomach   Diphenhydramine Hcl    REACTION: Stopped breathing in ICU   Doxycycline    Shortness of Breath    Iron Dextran    REACTION: vein irritation   Shellfish Allergy Hives      Medication List    TAKE these medications   ACCU-CHEK SOFTCLIX LANCETS lancets Use to check blood sugar 4 times per day.   acetaminophen 500 MG tablet Commonly known as:  TYLENOL Take 1,000 mg by mouth daily as needed for moderate pain.   amLODipine 10 MG tablet Commonly known as:  NORVASC Take 1 tablet (10 mg total) by mouth daily.   calcitRIOL 0.25 MCG capsule Commonly known as:  ROCALTROL Take 0.25 mcg by mouth daily.   CALCIUM 1000 + D PO Take 1 tablet by mouth daily.   calcium acetate 667 MG capsule Commonly known as:  PHOSLO Take 1,334 mg by mouth 3 (three) times daily with meals.   calcium elemental as carbonate 400 MG chewable tablet Commonly known as:  BARIATRIC TUMS ULTRA Chew 1,000 mg by mouth 3 (three) times daily as needed for heartburn.   carvedilol 12.5 MG tablet Commonly known as:  COREG Take 12.5 mg by mouth 2 (two) times daily with a meal.   dicyclomine 20 MG tablet Commonly known as:  BENTYL Take 1 tablet (20 mg total) by mouth 2 (two) times daily.   famotidine 10 MG tablet Commonly known as:  PEPCID Take 10 mg by mouth daily.   fluticasone 50 MCG/ACT nasal spray Commonly known as:  FLONASE Place 2 sprays into both nostrils daily. What changed:    when to take this  reasons to take this   gabapentin 300 MG capsule Commonly known as:  NEURONTIN Take 300 mg by mouth 3 (three) times  daily.   hydrALAZINE 50 MG tablet Commonly known as:  APRESOLINE Take 1 tablet (50 mg total) by mouth every 8 (eight) hours.   hydrOXYzine 50 MG tablet Commonly  known as:  ATARAX/VISTARIL Take 1 tablet (50 mg total) by mouth 2 (two) times daily as needed for itching.   insulin degludec 100 UNIT/ML Sopn FlexTouch Pen Commonly known as:  TRESIBA Inject 0.6 mLs (60 Units total) into the skin 2 (two) times daily. Reports taking 24 units QAM   loperamide 2 MG capsule Commonly known as:  IMODIUM Take 2 mg by mouth as needed for diarrhea or loose stools.   metoCLOPramide 10 MG tablet Commonly known as:  REGLAN Take 1 tablet (10 mg total) by mouth 3 (three) times daily before meals.   mycophenolate 360 MG Tbec EC tablet Commonly known as:  MYFORTIC Take 720 mg by mouth 2 (two) times daily.   NOVOLOG FLEXPEN 100 UNIT/ML FlexPen Generic drug:  insulin aspart Inject 12-15 Units into the skin See admin instructions. 5-6 times per day as needed   ondansetron 4 MG disintegrating tablet Commonly known as:  ZOFRAN ODT Take 1 tablet (4 mg total) by mouth every 4 (four) hours as needed for nausea or vomiting.   pantoprazole 40 MG tablet Commonly known as:  PROTONIX Take 40 mg by mouth 2 (two) times daily.   predniSONE 10 MG tablet Commonly known as:  DELTASONE Take 1 tablet (10 mg total) by mouth every morning.   promethazine 25 MG tablet Commonly known as:  PHENERGAN Take 1 tablet (25 mg total) by mouth every 6 (six) hours as needed for nausea or vomiting.   simvastatin 20 MG tablet Commonly known as:  ZOCOR Take 20 mg by mouth at bedtime.   sodium bicarbonate 650 MG tablet Take 650 mg by mouth 3 (three) times daily.   tacrolimus 1 MG capsule Commonly known as:  PROGRAF Take 3 mg by mouth 2 (two) times daily.   torsemide 20 MG tablet Commonly known as:  DEMADEX Take 4 tablets (80 mg total) by mouth 2 (two) times daily.       Follow-up Information    [name], MD.  Schedule an appointment as soon as possible for a visit in 1 week(s).   Specialty:  Internal Medicine Contact information: [address] [place] [address] [place]. Schedule an appointment as soon as possible for a visit in 1 week(s).   Why:  Possible pseudoseizures Contact information: [address]     [place] [phone] [phone]          Major procedures and Radiology Reports - PLEASE review detailed and final reports thoroughly  -         Dg Chest 1 View  Result Date: 05/01/2018 CLINICAL DATA:  Seizures. Pt stated that she's had multiple seizures since they started yesterday. Pt displayed a mini or possible mild seizure during exam. Her eyes didn't roll back and she was able to tell it was coming but she began slightly convulsing and stopped after a couple of seconds. Hx; XXXSyndrome (2010), pseudoseizures, HTN, DM EXAM: CHEST  1 VIEW COMPARISON:  03/20/2018 FINDINGS: Cardiac silhouette top-normal in size. No mediastinal or hilar masses. Right internal jugular tunneled dual lumen central venous catheter is stable, tip at the caval atrial junction. Lungs show vascular congestion and interstitial. No focal lung consolidation to suggest pneumonia. No convincing pleural effusion.  No pneumothorax. Skeletal structures are grossly intact. IMPRESSION: 1. Interstitial thickening suggests interstitial edema which may be on the basis failure or fluid overload. 2. No evidence of pneumonia. Electronically Signed   By:  Lajean Manes M.D.   On: 05/01/2018 12:03   Ct Head Wo Contrast  Result Date: 05/01/2018 CLINICAL DATA:  Seizures. No apparent injury. EXAM: CT HEAD WITHOUT CONTRAST TECHNIQUE: Contiguous axial images were obtained from the base of the skull through the vertex without intravenous contrast. COMPARISON:  07/20/2015. FINDINGS: Brain: No evidence for acute infarction, hemorrhage, mass lesion,  hydrocephalus, or extra-axial fluid. Premature for age atrophy. No definite white matter disease. Vascular: No hyperdense vessel or unexpected calcification. Skull: Normal. Negative for fracture or focal lesion. Sinuses/Orbits: No acute finding. Other: None. IMPRESSION: Premature for age atrophy. No acute intracranial findings. Progression of atrophy since 2017. Electronically Signed   By: Staci Righter M.D.   On: 05/01/2018 11:14    Micro Results     Recent Results (from the past 240 hour(s))  MRSA PCR Screening     Status: None   Collection Time: 05/01/18  8:10 PM  Result Value Ref Range Status   MRSA by PCR NEGATIVE NEGATIVE Final    Comment:        The GeneXpert MRSA Assay (FDA approved for NASAL specimens only), is one component of a comprehensive MRSA colonization surveillance program. It is not intended to diagnose MRSA infection nor to guide or monitor treatment for MRSA infections. Performed at [facility], [place] [address] [place], [place] [address]     Today   Subjective    [name] today has no headache,no chest abdominal pain,no new weakness tingling or numbness, feels much better wants to go home today.     Objective   Blood pressure (!) 136/92, pulse 89, temperature 98.5 F (36.9 C), temperature source Oral, resp. rate 18, height 5\' 6"  (1.676 m), weight 83.5 kg, SpO2 91 %.   Intake/Output Summary (Last 24 hours) at 05/02/2018 1234 Last data filed at 05/02/2018 0935 Gross per 24 hour  Intake 699.8 ml  Output 3512 ml  Net -2812.2 ml    Exam Awake Alert, Oriented x 3, No new F.N deficits, Normal affect [place].AT,PERRAL Supple Neck,No JVD, No cervical lymphadenopathy appriciated.  Symmetrical Chest wall movement, Good air movement bilaterally, CTAB RRR,No Gallops,Rubs or new Murmurs, No Parasternal Heave +ve B.Sounds, Abd Soft, Non tender, No organomegaly appriciated, No rebound -guarding or rigidity. No Cyanosis, Clubbing or edema, No new Rash or  bruise   Data Review   CBC w Diff:  Lab Results  Component Value Date   WBC 6.8 05/02/2018   HGB 8.0 (L) 05/02/2018   HCT 24.8 (L) 05/02/2018   PLT 123 (L) 05/02/2018   PLT 206 07/17/2009   LYMPHOPCT 20 03/20/2018   MONOPCT 7 03/20/2018   EOSPCT 4 03/20/2018   BASOPCT 0 03/20/2018    CMP:  Lab Results  Component Value Date   NA 136 05/02/2018   K 3.3 (L) 05/02/2018   CL 100 05/02/2018   CO2 25 05/02/2018   BUN 13 05/02/2018   CREATININE 2.68 (H) 05/02/2018   PROT 6.2 (L) 03/19/2018   ALBUMIN 2.6 (L) 03/19/2018   BILITOT 0.6 03/19/2018   ALKPHOS 34 (L) 03/19/2018   AST 12 (L) 03/19/2018   ALT 11 03/20/2018  . Lab Results  Component Value Date   HGBA1C 10.9 (H) 02/03/2018    CBG (last 3)  Recent Labs    05/02/18 0721 05/02/18 0928 05/02/18 1155  GLUCAP 149* 195* 334*     Total Time in preparing paper work, data evaluation and todays exam - 73 minutes  Shirlean Berman M.D  on 05/02/2018 at 12:34 PM  [facility]   Office  [phone]

## 2018-05-02 NOTE — Discharge Instructions (Signed)
Do not drive until you have been cleared by the neurologist to do so next visit.    Follow with Primary MD [name], MD in 7 days   Get CBC, BMP, A1c checked  by Primary MD  in 5-7 days    Activity: As tolerated with Full fall precautions use walker/cane & assistance as needed  Disposition Home    Diet: Renal, 1.5 lit/day fluid restriction. CBGs QAC-HS  Special Instructions: If you have smoked or chewed Tobacco  in the last 2 yrs please stop smoking, stop any regular Alcohol  and or any Recreational drug use.  On your next visit with your primary care physician please Get Medicines reviewed and adjusted.  Please request your Prim.MD to go over all Hospital Tests and Procedure/Radiological results at the follow up, please get all Hospital records sent to your Prim MD by signing hospital release before you go home.  If you experience worsening of your admission symptoms, develop shortness of breath, life threatening emergency, suicidal or homicidal thoughts you must seek medical attention immediately by calling 911 or calling your MD immediately  if symptoms less severe.  You Must read complete instructions/literature along with all the possible adverse reactions/side effects for all the Medicines you take and that have been prescribed to you. Take any new Medicines after you have completely understood and accpet all the possible adverse reactions/side effects.

## 2018-05-02 NOTE — Patient Outreach (Signed)
[facility]) Care Management  05/02/2018  DAMYA COMLEY [birth year] [id number]  Member with ED visit to [facility] on yesterday evening  05-01-2018 and with Observation admission due to diabetic ketoacidosis. Hospital Liaisons made aware.  Will follow up with member when discharged to home pending disposition needs.   [name] "[name]" [name], RN-BSN  [facility] Care Management  Community Care Management Coordinator  [phone] [email or web address]

## 2018-05-02 NOTE — Progress Notes (Addendum)
Inpatient Diabetes Program Recommendations  AACE/ADA: New Consensus Statement on Inpatient Glycemic Control (2015)  Target Ranges:  Prepandial:   less than 140 mg/dL      Peak postprandial:   less than 180 mg/dL (1-2 hours)      Critically ill patients:  140 - 180 mg/dL   Lab Results  Component Value Date   GLUCAP 195 (H) 05/02/2018   HGBA1C 10.9 (H) 02/03/2018     Diabetes history: Type 1 diabetes; ESRD on HD  Outpatient Diabetes medications: Novolog 12-15 units tid (and as needed); Tresiba 24 units Qam   Current orders for Inpatient glycemic control: Insulin drip stopped this am. Novolog sensitive scale (0-9 units) tid wc and (0-5 units) hs  Prednisone 10 mg daily    Patient is Type 1 diabetic therefore makes no insulin and requires basal, correction, and meal coverage.     Inpatient Diabetes Program Recommendations:    1. Lantus 12 units Q 24 hours (Start now as drip just turned off). This is 1/2 of home basal dose.   2. Novolog meal coverage 3 units tid wc (Please add the following hold parameters:  Hold if NPO or eats less than 50% of meal.)   Paged MD with above recommendations.    -- Will follow during hospitalization.--  Jonna Clark RN, MSN Diabetes Coordinator Inpatient Glycemic Control Team Team Pager: [phone] (8am-5pm)

## 2018-05-02 NOTE — Progress Notes (Signed)
HD tx completed @ 0520 w/o problem UF goal met Blood rinsed back VSS Report called to Ranelle Oyster, RN

## 2018-05-02 NOTE — Progress Notes (Signed)
Nsg Discharge Note  Admit Date:  05/01/2018 Discharge date: 05/02/2018   MERELIN HUMAN to be D/C'd Home per MD order.  AVS completed.  Copy for chart, and copy for patient signed, and dated. Patient/caregiver able to verbalize understanding.  Discharge Medication: Allergies as of 05/02/2018      Reactions   Benadryl [diphenhydramine-zinc Acetate] Shortness Of Breath   Motrin [ibuprofen] Shortness Of Breath, Itching   Per pt   Banana Other (See Comments)   Sick on the stomach   Diphenhydramine Hcl    REACTION: Stopped breathing in ICU   Doxycycline    Shortness of Breath    Iron Dextran    REACTION: vein irritation   Shellfish Allergy Hives      Medication List    TAKE these medications   ACCU-CHEK SOFTCLIX LANCETS lancets Use to check blood sugar 4 times per day.   acetaminophen 500 MG tablet Commonly known as:  TYLENOL Take 1,000 mg by mouth daily as needed for moderate pain.   amLODipine 10 MG tablet Commonly known as:  NORVASC Take 1 tablet (10 mg total) by mouth daily.   calcitRIOL 0.25 MCG capsule Commonly known as:  ROCALTROL Take 0.25 mcg by mouth daily.   CALCIUM 1000 + D PO Take 1 tablet by mouth daily.   calcium acetate 667 MG capsule Commonly known as:  PHOSLO Take 1,334 mg by mouth 3 (three) times daily with meals.   calcium elemental as carbonate 400 MG chewable tablet Commonly known as:  BARIATRIC TUMS ULTRA Chew 1,000 mg by mouth 3 (three) times daily as needed for heartburn.   carvedilol 12.5 MG tablet Commonly known as:  COREG Take 12.5 mg by mouth 2 (two) times daily with a meal.   dicyclomine 20 MG tablet Commonly known as:  BENTYL Take 1 tablet (20 mg total) by mouth 2 (two) times daily.   famotidine 10 MG tablet Commonly known as:  PEPCID Take 10 mg by mouth daily.   fluticasone 50 MCG/ACT nasal spray Commonly known as:  FLONASE Place 2 sprays into both nostrils daily. What changed:    when to take this  reasons to take this   gabapentin 300 MG capsule Commonly known as:  NEURONTIN Take 300 mg by mouth 3 (three) times daily.   hydrALAZINE 50 MG tablet Commonly known as:  APRESOLINE Take 1 tablet (50 mg total) by mouth every 8 (eight) hours.   hydrOXYzine 50 MG tablet Commonly known as:  ATARAX/VISTARIL Take 1 tablet (50 mg total) by mouth 2 (two) times daily as needed for itching.   insulin degludec 100 UNIT/ML Sopn FlexTouch Pen Commonly known as:  TRESIBA Inject 0.6 mLs (60 Units total) into the skin 2 (two) times daily. Reports taking 24 units QAM   loperamide 2 MG capsule Commonly known as:  IMODIUM Take 2 mg by mouth as needed for diarrhea or loose stools.   metoCLOPramide 10 MG tablet Commonly known as:  REGLAN Take 1 tablet (10 mg total) by mouth 3 (three) times daily before meals.   mycophenolate 360 MG Tbec EC tablet Commonly known as:  MYFORTIC Take 720 mg by mouth 2 (two) times daily.   NOVOLOG FLEXPEN 100 UNIT/ML FlexPen Generic drug:  insulin aspart Inject 12-15 Units into the skin See admin instructions. 5-6 times per day as needed   ondansetron 4 MG disintegrating tablet Commonly known as:  ZOFRAN ODT Take 1 tablet (4 mg total) by mouth every 4 (four) hours as needed for  nausea or vomiting.   pantoprazole 40 MG tablet Commonly known as:  PROTONIX Take 40 mg by mouth 2 (two) times daily.   predniSONE 10 MG tablet Commonly known as:  DELTASONE Take 1 tablet (10 mg total) by mouth every morning.   promethazine 25 MG tablet Commonly known as:  PHENERGAN Take 1 tablet (25 mg total) by mouth every 6 (six) hours as needed for nausea or vomiting.   simvastatin 20 MG tablet Commonly known as:  ZOCOR Take 20 mg by mouth at bedtime.   sodium bicarbonate 650 MG tablet Take 650 mg by mouth 3 (three) times daily.   tacrolimus 1 MG capsule Commonly known as:  PROGRAF Take 3 mg by mouth 2 (two) times daily.   torsemide 20 MG tablet Commonly known as:  DEMADEX Take 4 tablets (80  mg total) by mouth 2 (two) times daily.       Discharge Assessment: Vitals:   05/02/18 0527 05/02/18 0618  BP: (!) 125/98 (!) 136/92  Pulse: 93 89  Resp: 17 18  Temp: 97.7 F (36.5 C) 98.5 F (36.9 C)  SpO2: 97% 91%   Skin clean, dry and intact without evidence of skin break down, no evidence of skin tears noted. IV catheter discontinued intact. Site without signs and symptoms of complications - no redness or edema noted at insertion site, patient denies c/o pain - only slight tenderness at site.  Dressing with slight pressure applied.  D/c Instructions-Education: Discharge instructions given to patient/family with verbalized understanding. D/c education completed with patient/family including follow up instructions, medication list, d/c activities limitations if indicated, with other d/c instructions as indicated by MD - patient able to verbalize understanding, all questions fully answered. Patient instructed to return to ED, call 911, or call MD for any changes in condition.  Patient escorted via [place], and D/C home via private auto.  Tresa Endo, RN 05/02/2018 3:24 PM

## 2018-05-02 NOTE — Progress Notes (Signed)
[facility] Progress Note  Subjective: had hD last night w/ 3.5 L NET UF.   Vitals:   05/02/18 0500 05/02/18 0520 05/02/18 0527 05/02/18 0618  BP: 119/78 139/87 (!) 125/98 (!) 136/92  Pulse: 92 92 93 89  Resp: 15 18 17 18   Temp:   97.7 F (36.5 C) 98.5 F (36.9 C)  TempSrc:   Oral Oral  SpO2: 96% 98% 97% 91%  Weight:   83.5 kg   Height:        Inpatient medications: . amLODipine  10 mg Oral Daily  . calcitRIOL  0.25 mcg Oral Daily  . calcium acetate  1,334 mg Oral TID WC  . carvedilol  12.5 mg Oral BID WC  . chlorhexidine  15 mL Mouth Rinse BID  . Chlorhexidine Gluconate Cloth  6 each Topical Q0600  . dicyclomine  20 mg Oral BID  . enoxaparin (LOVENOX) injection  30 mg Subcutaneous Q24H  . famotidine  10 mg Oral Daily  . gabapentin  300 mg Oral TID  . hydrALAZINE  50 mg Oral Q8H  . insulin aspart  0-5 Units Subcutaneous QHS  . insulin aspart  0-9 Units Subcutaneous TID WC  . mouth rinse  15 mL Mouth Rinse q12n4p  . mycophenolate  720 mg Oral BID  . pantoprazole  40 mg Oral BID  . predniSONE  10 mg Oral Q breakfast  . sodium bicarbonate  650 mg Oral TID  . tacrolimus  3 mg Oral BID   . sodium chloride    . sodium chloride     sodium chloride, sodium chloride, acetaminophen **OR** acetaminophen, alteplase, calcium carbonate (dosed in mg elemental calcium), camphor-menthol **AND** hydrOXYzine, docusate sodium, feeding supplement (NEPRO CARB STEADY), heparin, lidocaine (PF), lidocaine-prilocaine, LORazepam, [DISCONTINUED] ondansetron **OR** ondansetron (ZOFRAN) IV, pentafluoroprop-tetrafluoroeth, sorbitol  Iron/TIBC/Ferritin/ %Sat    Component Value Date/Time   IRON 82 03/20/2018 0459   TIBC 132 (L) 03/20/2018 0459   FERRITIN 855 (H) 03/20/2018 0459   IRONPCTSAT 62 (H) 03/20/2018 0459    Exam: General: somnolent, not in distress Neck: no jvd Lungs: Clear bilat no rales Heart: RRR with normal S1, S2. No murmurs, rubs, or gallops  appreciated. Abdomen: Soft, non-tender + bs Ext: 1+ edema today, improving Neuro: somnolent, was not awakened Dialysis Access: TDC in R chest  MWF [facility]  4h  82kg  2K/3.5 bath  TDC  Heparin 2000  - venofer 50 q wk  - mircera 200 ug every 2 wks, last on 2/4      Assessment/ Plan 1. DKA/hyperglycemia: Her brother reported out of insulin and all other meds for 5 days. Better today after IV insulin drip.  2. Seizures: possible, prob related to severe metabolic derangement 3. ESRD: HD MWF. Had HD last night. Plan HD again tomorrow.  4. Volume/ BP: close to dry wt, large UF on HD yest. BP's ok 5. Hx kidney Tx - recently failed and resumed HD 03/2018. Plan was for living donor transplant in near future so plan is to keep prior dose immunosuppressants going (mycophen 720mg  BID, pred 10 QHS, tacro 1mg  BID) 6.  Anemia ckd: Hgb 8.2 - stable from outpatient levels. Due for ESA this week - will re-dose. 7.  Metabolic bone disease: Ca low - 3.5Ca bath with HD. Hx parathyroidectomy. Need to resume home Phoslo binders and Tums QHS once alert/eating. 8. Uncontrolled T1DM (last A1c 10.9%)   [facility] 05/02/2018, 10:23 AM  Recent Labs  Lab 05/01/18 2302 05/02/18  0815  NA 135 136  K 3.4* 3.3*  CL 102 100  CO2 20* 25  GLUCOSE 224* 176*  BUN 51* 13  CREATININE 6.03* 2.68*  CALCIUM 6.5* 6.8*   No results for input(s): AST, ALT, ALKPHOS, BILITOT, PROT in the last 168 hours. Recent Labs  Lab 05/01/18 1013 05/01/18 1030 05/02/18 0815  WBC 7.6  --  6.8  HGB 8.3* 8.2* 8.0*  HCT 27.3* 24.0* 24.8*  MCV 92.2  --  85.8  PLT 124*  --  123*

## 2018-05-02 NOTE — Progress Notes (Signed)
Gave report to dialysis RN, Tori. Transport transported pt to dialysis in bed. Pt going to dialysis on glucostablizer. Herbert Spires states they will continue glucostablizer and insulin drip while pt in dialysis. Notified telemetry that pt is in dialysis. Will continue to monitor pt. Ranelle Oyster, RN

## 2018-05-02 NOTE — Progress Notes (Signed)
HD tx initiated via HD cath w/o problem Bilat ports: pull/push/flush equally w/o problem VSS Will continue to monitor while on HD tx 

## 2018-05-03 LAB — HEMOGLOBIN A1C
Hgb A1c MFr Bld: 10.8 % — ABNORMAL HIGH (ref 4.8–5.6)
MEAN PLASMA GLUCOSE: 263 mg/dL

## 2018-05-04 ENCOUNTER — Other Ambulatory Visit: Payer: Self-pay

## 2018-05-04 NOTE — Patient Outreach (Addendum)
[facility]) Care Management  05/04/2018  CARAMIA BOUTIN 33 year old female 1985/06/02 [id number]    Member with recent ED to [facility] Admission 05-01-2018 to 05-02-2018 due to Diabetic ketoacidosis. Called member at [employer] preferred numberin order to complete initial assessment,  to discuss recent discharge summary, and to assess for any needs; however, no answer. HIPPA compliant voice mail message left requesting return call when available.  Updated member's initial assessment due to when visited member at home, member in process of leaving home for transportation to hemodialysis. Asked member questions and wrote down responses; however, did not complete assessment at that time.   RN CM with multiple failed outreach attempts to member. Will send Education concerning hemodialysis. Will await return call from member. Will continue with hold process per workflow.  [name] "[name]" [name], RN-BSN  [facility] Care Management  Community Care Management Coordinator  [phone] [email or web address]

## 2018-05-08 ENCOUNTER — Other Ambulatory Visit: Payer: Self-pay

## 2018-05-08 NOTE — Patient Outreach (Signed)
[facility]) Care Management  05/08/2018  [name] 30-Apr-1985 [id number]  Case Closure due to inability to continue contact with member. No other [facility] disciplines involved. Assessment and Closure Letter sent to PCP.  [name] "[name]" [name], RN-BSN  [facility] Care Management  Community Care Management Coordinator  [phone] [email or web address]

## 2018-06-14 ENCOUNTER — Other Ambulatory Visit: Payer: Self-pay

## 2018-06-14 NOTE — Patient Outreach (Signed)
[facility]) Care Management  06/14/2018  [name] Feb 07, 1986 [id number]  Received email from [place] stating that member requesting Meals on Wheels. Called member at preferred number and HIPPA identifiers verified.  Member stated that she was told at her hemodialysis center to contact [facility] for assistance with meals. Member states she continues with Hemodialysis MWF and is transported via [employer].   Assessed if member is practicing social distancing and good handwashing and member stated that she is unable to find soap, sanitizer, and toilet paper as the stores near her location do not have these in stock.   Will send referral to  [place] to [place] "[name]" [name], [place] Management  [facility] Management Coordinator  [phone] [email or web address]

## 2018-06-15 ENCOUNTER — Other Ambulatory Visit: Payer: Self-pay

## 2018-06-15 NOTE — Patient Outreach (Signed)
[facility]) Care Management  06/15/2018  [name] 1985-05-28 [id number]   Successful outreach to patient regarding social work referral for assistance with meals and toiletries.  BSW informed patient of [employer] Me[employer] program and completed referral form via phone.  Form does have to be signed by patient so BSW was given permission by her to contact Marina Gravel, social worker at [employer], to assist with this.  Referral form was sent via secure email to [facility] who will have patient sign it tomorrow and fax to intake at [employer]. Patient reports being almost out of toilet paper and soap.  She has not been able to find these supplies in stores close to her home and has limited transportation.  BSW agreed to attempt to locate supplies for her and deliver them to her home.  Patient also requested hand sanitizer but BSW is not aware of any stores with this in stock.    Ronn Melena, BSW Social Worker [phone]

## 2018-06-19 ENCOUNTER — Other Ambulatory Visit: Payer: Self-pay

## 2018-06-19 NOTE — Patient Outreach (Signed)
[facility]) Care Management  06/19/2018  CINDIA HUSTEAD [birth year] [id number]   BSW delivered requested toiletries to patient's home.  BSW left items at front door as no home visits are occurring during this time of COVID-19.  Patient confirmed receipt of items.  BSW informed her that application for [employer] Meals is being processed.  Will follow up with her before the end of the week to ensure meal delivery has begun.  Ronn Melena, BSW Social Worker [phone]

## 2018-06-22 ENCOUNTER — Ambulatory Visit: Payer: Self-pay

## 2018-06-22 ENCOUNTER — Other Ambulatory Visit: Payer: Self-pay

## 2018-06-22 NOTE — Patient Outreach (Addendum)
[facility]) Care Management  06/22/2018  BHAVYA ESCHETE October 17, 1985 [id number]   Unsuccessful outreach to patient to ensure receipt of [employer]'s Meals.  BSW left voicemail message.  Will attempt to reach again within four business days.  Addendum: BSW received return call from patient who confirmed receipt of [employer] Meals.  BSW closing case at this time.   Ronn Melena, BSW Social Worker [phone]

## 2018-06-23 ENCOUNTER — Ambulatory Visit: Payer: Medicare Other

## 2018-06-26 ENCOUNTER — Ambulatory Visit: Payer: Self-pay

## 2018-07-03 ENCOUNTER — Other Ambulatory Visit: Payer: Self-pay

## 2018-07-03 ENCOUNTER — Ambulatory Visit (INDEPENDENT_AMBULATORY_CARE_PROVIDER_SITE_OTHER): Payer: Medicare Other | Admitting: Podiatry

## 2018-07-03 ENCOUNTER — Ambulatory Visit: Payer: Medicare Other

## 2018-07-03 ENCOUNTER — Encounter: Payer: Self-pay | Admitting: Podiatry

## 2018-07-03 VITALS — Temp 98.1°F

## 2018-07-03 DIAGNOSIS — M2042 Other hammer toe(s) (acquired), left foot: Secondary | ICD-10-CM | POA: Diagnosis not present

## 2018-07-03 DIAGNOSIS — E1065 Type 1 diabetes mellitus with hyperglycemia: Secondary | ICD-10-CM | POA: Diagnosis not present

## 2018-07-03 DIAGNOSIS — M79674 Pain in right toe(s): Secondary | ICD-10-CM | POA: Diagnosis not present

## 2018-07-03 DIAGNOSIS — M79671 Pain in right foot: Secondary | ICD-10-CM

## 2018-07-03 DIAGNOSIS — L84 Corns and callosities: Secondary | ICD-10-CM | POA: Diagnosis not present

## 2018-07-03 DIAGNOSIS — M79675 Pain in left toe(s): Secondary | ICD-10-CM | POA: Diagnosis not present

## 2018-07-03 DIAGNOSIS — E108 Type 1 diabetes mellitus with unspecified complications: Secondary | ICD-10-CM

## 2018-07-03 DIAGNOSIS — B351 Tinea unguium: Secondary | ICD-10-CM | POA: Diagnosis not present

## 2018-07-03 DIAGNOSIS — IMO0002 Reserved for concepts with insufficient information to code with codable children: Secondary | ICD-10-CM

## 2018-07-03 DIAGNOSIS — M79672 Pain in left foot: Principal | ICD-10-CM

## 2018-07-03 NOTE — Progress Notes (Signed)
Subjective:   Patient ID: [name], female   DOB: 33 y.o.   MRN: [id number]   HPI Poorly controlled diabetic who presents with nail disease lesion formation left big toe and concerns about hammertoe deformity left foot with questions about the deformity itself.   ROS      Objective:  Physical Exam  Vascular status moderately diminished with diminished sharp dull vibratory consistent with diabetic nerve issues with elongated incurvated nailbeds and also keratotic lesion left hallux medial side is painful.  Patient has had dialysis today and is tired and states that the toe become sore and she is concerned about the elevation of the second toe left     Assessment:  Hammertoe deformity keratotic lesion formation mycotic nail infection with pain 1-5 both feet     Plan:  H&P reviewed all conditions and for hammertoe did not recommend surgery even though someday in her life that might be necessary.  I debrided nailbeds 1-5 both feet with no iatrogenic bleeding and lesion on the left with no iatrogenic bleeding advised on good shoe gear choices and be seen back for routine care

## 2018-07-04 ENCOUNTER — Ambulatory Visit: Payer: Medicare Other | Admitting: Vascular Surgery

## 2018-09-01 ENCOUNTER — Other Ambulatory Visit: Payer: Self-pay

## 2018-09-01 ENCOUNTER — Inpatient Hospital Stay (HOSPITAL_COMMUNITY)
Admission: EM | Admit: 2018-09-01 | Discharge: 2018-09-03 | DRG: 698 | Disposition: A | Discharge status: Home / Self Care | Payer: Medicare Other | Attending: Internal Medicine | Admitting: Internal Medicine

## 2018-09-01 ENCOUNTER — Emergency Department (HOSPITAL_COMMUNITY): Payer: Medicare Other

## 2018-09-01 ENCOUNTER — Encounter (HOSPITAL_COMMUNITY): Payer: Self-pay | Admitting: Emergency Medicine

## 2018-09-01 DIAGNOSIS — T8612 Kidney transplant failure: Secondary | ICD-10-CM | POA: Diagnosis present

## 2018-09-01 DIAGNOSIS — Y830 Surgical operation with transplant of whole organ as the cause of abnormal reaction of the patient, or of later complication, without mention of misadventure at the time of the procedure: Secondary | ICD-10-CM | POA: Diagnosis present

## 2018-09-01 DIAGNOSIS — Z7951 Long term (current) use of inhaled steroids: Secondary | ICD-10-CM

## 2018-09-01 DIAGNOSIS — Q970 Karyotype 47, XXX: Secondary | ICD-10-CM | POA: Diagnosis not present

## 2018-09-01 DIAGNOSIS — D631 Anemia in chronic kidney disease: Secondary | ICD-10-CM | POA: Diagnosis present

## 2018-09-01 DIAGNOSIS — Z888 Allergy status to other drugs, medicaments and biological substances status: Secondary | ICD-10-CM

## 2018-09-01 DIAGNOSIS — Z881 Allergy status to other antibiotic agents status: Secondary | ICD-10-CM

## 2018-09-01 DIAGNOSIS — R109 Unspecified abdominal pain: Secondary | ICD-10-CM | POA: Diagnosis not present

## 2018-09-01 DIAGNOSIS — Z886 Allergy status to analgesic agent status: Secondary | ICD-10-CM

## 2018-09-01 DIAGNOSIS — Z9089 Acquired absence of other organs: Secondary | ICD-10-CM | POA: Diagnosis not present

## 2018-09-01 DIAGNOSIS — I160 Hypertensive urgency: Secondary | ICD-10-CM

## 2018-09-01 DIAGNOSIS — E876 Hypokalemia: Secondary | ICD-10-CM | POA: Diagnosis present

## 2018-09-01 DIAGNOSIS — I161 Hypertensive emergency: Secondary | ICD-10-CM | POA: Diagnosis present

## 2018-09-01 DIAGNOSIS — Z833 Family history of diabetes mellitus: Secondary | ICD-10-CM | POA: Diagnosis not present

## 2018-09-01 DIAGNOSIS — T8613 Kidney transplant infection: Principal | ICD-10-CM | POA: Diagnosis present

## 2018-09-01 DIAGNOSIS — K3184 Gastroparesis: Secondary | ICD-10-CM | POA: Diagnosis present

## 2018-09-01 DIAGNOSIS — N186 End stage renal disease: Secondary | ICD-10-CM | POA: Diagnosis present

## 2018-09-01 DIAGNOSIS — Z8249 Family history of ischemic heart disease and other diseases of the circulatory system: Secondary | ICD-10-CM

## 2018-09-01 DIAGNOSIS — E1122 Type 2 diabetes mellitus with diabetic chronic kidney disease: Secondary | ICD-10-CM | POA: Diagnosis present

## 2018-09-01 DIAGNOSIS — Z20828 Contact with and (suspected) exposure to other viral communicable diseases: Secondary | ICD-10-CM | POA: Diagnosis present

## 2018-09-01 DIAGNOSIS — E1143 Type 2 diabetes mellitus with diabetic autonomic (poly)neuropathy: Secondary | ICD-10-CM | POA: Diagnosis present

## 2018-09-01 DIAGNOSIS — N136 Pyonephrosis: Secondary | ICD-10-CM | POA: Diagnosis present

## 2018-09-01 DIAGNOSIS — Z992 Dependence on renal dialysis: Secondary | ICD-10-CM

## 2018-09-01 DIAGNOSIS — Z7952 Long term (current) use of systemic steroids: Secondary | ICD-10-CM | POA: Diagnosis not present

## 2018-09-01 DIAGNOSIS — R112 Nausea with vomiting, unspecified: Secondary | ICD-10-CM | POA: Diagnosis not present

## 2018-09-01 DIAGNOSIS — Z79899 Other long term (current) drug therapy: Secondary | ICD-10-CM

## 2018-09-01 DIAGNOSIS — I120 Hypertensive chronic kidney disease with stage 5 chronic kidney disease or end stage renal disease: Secondary | ICD-10-CM | POA: Diagnosis present

## 2018-09-01 DIAGNOSIS — E1165 Type 2 diabetes mellitus with hyperglycemia: Secondary | ICD-10-CM | POA: Diagnosis present

## 2018-09-01 DIAGNOSIS — F819 Developmental disorder of scholastic skills, unspecified: Secondary | ICD-10-CM | POA: Diagnosis present

## 2018-09-01 DIAGNOSIS — N2581 Secondary hyperparathyroidism of renal origin: Secondary | ICD-10-CM | POA: Diagnosis present

## 2018-09-01 DIAGNOSIS — Z794 Long term (current) use of insulin: Secondary | ICD-10-CM

## 2018-09-01 DIAGNOSIS — Z9049 Acquired absence of other specified parts of digestive tract: Secondary | ICD-10-CM | POA: Diagnosis not present

## 2018-09-01 DIAGNOSIS — R1011 Right upper quadrant pain: Secondary | ICD-10-CM | POA: Diagnosis not present

## 2018-09-01 DIAGNOSIS — IMO0002 Reserved for concepts with insufficient information to code with codable children: Secondary | ICD-10-CM

## 2018-09-01 DIAGNOSIS — E1065 Type 1 diabetes mellitus with hyperglycemia: Secondary | ICD-10-CM

## 2018-09-01 LAB — CBC WITH DIFFERENTIAL/PLATELET
Abs Immature Granulocytes: 0.06 10*3/uL (ref 0.00–0.07)
Basophils Absolute: 0 10*3/uL (ref 0.0–0.1)
Basophils Relative: 0 %
Eosinophils Absolute: 0.2 10*3/uL (ref 0.0–0.5)
Eosinophils Relative: 2 %
HCT: 30.7 % — ABNORMAL LOW (ref 36.0–46.0)
Hemoglobin: 9.2 g/dL — ABNORMAL LOW (ref 12.0–15.0)
Immature Granulocytes: 1 %
Lymphocytes Relative: 15 %
Lymphs Abs: 1.5 10*3/uL (ref 0.7–4.0)
MCH: 26.3 pg (ref 26.0–34.0)
MCHC: 30 g/dL (ref 30.0–36.0)
MCV: 87.7 fL (ref 80.0–100.0)
Monocytes Absolute: 0.5 10*3/uL (ref 0.1–1.0)
Monocytes Relative: 5 %
Neutro Abs: 7.9 10*3/uL — ABNORMAL HIGH (ref 1.7–7.7)
Neutrophils Relative %: 77 %
Platelets: 316 10*3/uL (ref 150–400)
RBC: 3.5 MIL/uL — ABNORMAL LOW (ref 3.87–5.11)
RDW: 15.5 % (ref 11.5–15.5)
WBC: 10.2 10*3/uL (ref 4.0–10.5)
nRBC: 0 % (ref 0.0–0.2)

## 2018-09-01 LAB — HEMOGLOBIN A1C
Hgb A1c MFr Bld: 10.1 % — ABNORMAL HIGH (ref 4.8–5.6)
Mean Plasma Glucose: 243.17 mg/dL

## 2018-09-01 LAB — POCT I-STAT EG7
Acid-Base Excess: 11 mmol/L — ABNORMAL HIGH (ref 0.0–2.0)
Bicarbonate: 33.4 mmol/L — ABNORMAL HIGH (ref 20.0–28.0)
Calcium, Ion: 1.24 mmol/L (ref 1.15–1.40)
HCT: 32 % — ABNORMAL LOW (ref 36.0–46.0)
Hemoglobin: 10.9 g/dL — ABNORMAL LOW (ref 12.0–15.0)
O2 Saturation: 99 %
Potassium: 3.1 mmol/L — ABNORMAL LOW (ref 3.5–5.1)
Sodium: 137 mmol/L (ref 135–145)
TCO2: 34 mmol/L — ABNORMAL HIGH (ref 22–32)
pCO2, Ven: 34.4 mmHg — ABNORMAL LOW (ref 44.0–60.0)
pH, Ven: 7.596 — ABNORMAL HIGH (ref 7.250–7.430)
pO2, Ven: 113 mmHg — ABNORMAL HIGH (ref 32.0–45.0)

## 2018-09-01 LAB — SARS CORONAVIRUS 2: SARS Coronavirus 2: NOT DETECTED

## 2018-09-01 LAB — CBG MONITORING, ED: Glucose-Capillary: 322 mg/dL — ABNORMAL HIGH (ref 70–99)

## 2018-09-01 LAB — COMPREHENSIVE METABOLIC PANEL
ALT: 11 U/L (ref 0–44)
AST: 16 U/L (ref 15–41)
Albumin: 3.4 g/dL — ABNORMAL LOW (ref 3.5–5.0)
Alkaline Phosphatase: 42 U/L (ref 38–126)
Anion gap: 13 (ref 5–15)
BUN: 11 mg/dL (ref 6–20)
CO2: 26 mmol/L (ref 22–32)
Calcium: 10.4 mg/dL — ABNORMAL HIGH (ref 8.9–10.3)
Chloride: 96 mmol/L — ABNORMAL LOW (ref 98–111)
Creatinine, Ser: 2.97 mg/dL — ABNORMAL HIGH (ref 0.44–1.00)
GFR calc Af Amer: 23 mL/min — ABNORMAL LOW (ref >60)
GFR calc non Af Amer: 20 mL/min — ABNORMAL LOW (ref >60)
Glucose, Bld: 240 mg/dL — ABNORMAL HIGH (ref 70–99)
Potassium: 3 mmol/L — ABNORMAL LOW (ref 3.5–5.1)
Sodium: 135 mmol/L (ref 135–145)
Total Bilirubin: 0.7 mg/dL (ref 0.3–1.2)
Total Protein: 8.2 g/dL — ABNORMAL HIGH (ref 6.5–8.1)

## 2018-09-01 LAB — I-STAT CHEM 8, ED
BUN: 13 mg/dL (ref 6–20)
Calcium, Ion: 1.22 mmol/L (ref 1.15–1.40)
Chloride: 98 mmol/L (ref 98–111)
Creatinine, Ser: 2.8 mg/dL — ABNORMAL HIGH (ref 0.44–1.00)
Glucose, Bld: 243 mg/dL — ABNORMAL HIGH (ref 70–99)
HCT: 31 % — ABNORMAL LOW (ref 36.0–46.0)
Hemoglobin: 10.5 g/dL — ABNORMAL LOW (ref 12.0–15.0)
Potassium: 3.1 mmol/L — ABNORMAL LOW (ref 3.5–5.1)
Sodium: 136 mmol/L (ref 135–145)
TCO2: 32 mmol/L (ref 22–32)

## 2018-09-01 LAB — LACTIC ACID, PLASMA
Lactic Acid, Venous: 1.1 mmol/L (ref 0.5–1.9)
Lactic Acid, Venous: 1.1 mmol/L (ref 0.5–1.9)

## 2018-09-01 LAB — LIPASE, BLOOD: Lipase: 70 U/L — ABNORMAL HIGH (ref 11–51)

## 2018-09-01 LAB — GLUCOSE, CAPILLARY: Glucose-Capillary: 327 mg/dL — ABNORMAL HIGH (ref 70–99)

## 2018-09-01 LAB — HCG, QUANTITATIVE, PREGNANCY: hCG, Beta Chain, Quant, S: 7 m[IU]/mL — ABNORMAL HIGH (ref <5)

## 2018-09-01 LAB — BRAIN NATRIURETIC PEPTIDE: B Natriuretic Peptide: 329.5 pg/mL — ABNORMAL HIGH (ref 0.0–100.0)

## 2018-09-01 MED ORDER — ALTEPLASE 2 MG IJ SOLR
2.0000 mg | Freq: Once | INTRAMUSCULAR | Status: DC | PRN
  Filled 2018-09-01: qty 2

## 2018-09-01 MED ORDER — HEPARIN SODIUM (PORCINE) 1000 UNIT/ML DIALYSIS
4000.0000 [IU] | Freq: Once | INTRAMUSCULAR | Status: DC
  Filled 2018-09-01: qty 4

## 2018-09-01 MED ORDER — LABETALOL HCL 5 MG/ML IV SOLN
10.0000 mg | INTRAVENOUS | Status: DC | PRN
  Administered 2018-09-01: 10 mg via INTRAVENOUS
  Filled 2018-09-01: qty 4

## 2018-09-01 MED ORDER — ONDANSETRON HCL 4 MG/2ML IJ SOLN: 4.0000 mg | Freq: Four times a day (QID) | INTRAMUSCULAR | Status: DC | PRN

## 2018-09-01 MED ORDER — PROMETHAZINE HCL 25 MG PO TABS: 25.0000 mg | ORAL_TABLET | Freq: Four times a day (QID) | ORAL | Status: DC | PRN

## 2018-09-01 MED ORDER — MORPHINE SULFATE (PF) 4 MG/ML IV SOLN
4.0000 mg | Freq: Once | INTRAVENOUS | Status: AC
  Administered 2018-09-01: 4 mg via INTRAVENOUS
  Filled 2018-09-01: qty 1

## 2018-09-01 MED ORDER — ONDANSETRON HCL 4 MG/2ML IJ SOLN
4.0000 mg | Freq: Once | INTRAMUSCULAR | Status: AC
  Administered 2018-09-01: 4 mg via INTRAVENOUS
  Filled 2018-09-01: qty 2

## 2018-09-01 MED ORDER — METOCLOPRAMIDE HCL 10 MG PO TABS
5.0000 mg | ORAL_TABLET | Freq: Two times a day (BID) | ORAL | Status: DC
  Administered 2018-09-01 – 2018-09-03 (×4): 5 mg via ORAL
  Filled 2018-09-01 (×4): qty 1

## 2018-09-01 MED ORDER — INSULIN ASPART 100 UNIT/ML ~~LOC~~ SOLN
0.0000 [IU] | Freq: Every day | SUBCUTANEOUS | Status: DC
  Administered 2018-09-02 (×2): 4 [IU] via SUBCUTANEOUS

## 2018-09-01 MED ORDER — HYDROMORPHONE HCL 1 MG/ML IJ SOLN
0.5000 mg | Freq: Four times a day (QID) | INTRAMUSCULAR | Status: DC | PRN
  Administered 2018-09-01 (×2): 0.5 mg via INTRAVENOUS
  Filled 2018-09-01: qty 0.5
  Filled 2018-09-01: qty 1

## 2018-09-01 MED ORDER — LIDOCAINE HCL (PF) 1 % IJ SOLN: 5.0000 mL | INTRAMUSCULAR | Status: DC | PRN

## 2018-09-01 MED ORDER — PIPERACILLIN-TAZOBACTAM 3.375 G IVPB
3.3750 g | Freq: Two times a day (BID) | INTRAVENOUS | Status: DC
  Administered 2018-09-01 – 2018-09-03 (×4): 3.375 g via INTRAVENOUS
  Filled 2018-09-01 (×4): qty 50

## 2018-09-01 MED ORDER — AMLODIPINE BESYLATE 10 MG PO TABS
10.0000 mg | ORAL_TABLET | Freq: Every day | ORAL | Status: DC
  Administered 2018-09-01 – 2018-09-03 (×2): 10 mg via ORAL
  Filled 2018-09-01: qty 1
  Filled 2018-09-01: qty 2

## 2018-09-01 MED ORDER — SENNOSIDES-DOCUSATE SODIUM 8.6-50 MG PO TABS
2.0000 | ORAL_TABLET | Freq: Every day | ORAL | Status: DC
  Administered 2018-09-02 (×2): 2 via ORAL
  Filled 2018-09-01 (×2): qty 2

## 2018-09-01 MED ORDER — CARVEDILOL 12.5 MG PO TABS
12.5000 mg | ORAL_TABLET | Freq: Two times a day (BID) | ORAL | Status: DC
  Administered 2018-09-03: 12.5 mg via ORAL
  Filled 2018-09-01 (×2): qty 1

## 2018-09-01 MED ORDER — LIDOCAINE-PRILOCAINE 2.5-2.5 % EX CREA: 1.0000 "application " | TOPICAL_CREAM | CUTANEOUS | Status: DC | PRN

## 2018-09-01 MED ORDER — SODIUM CHLORIDE 0.9 % IV SOLN: 100.0000 mL | INTRAVENOUS | Status: DC | PRN

## 2018-09-01 MED ORDER — HYDRALAZINE HCL 20 MG/ML IJ SOLN
10.0000 mg | Freq: Once | INTRAMUSCULAR | Status: DC
  Filled 2018-09-01: qty 1

## 2018-09-01 MED ORDER — POTASSIUM CHLORIDE 10 MEQ/100ML IV SOLN: 10.0000 meq | INTRAVENOUS | Status: DC

## 2018-09-01 MED ORDER — CHLORHEXIDINE GLUCONATE CLOTH 2 % EX PADS
6.0000 | MEDICATED_PAD | Freq: Every day | CUTANEOUS | Status: DC
  Administered 2018-09-02 – 2018-09-03 (×2): 6 via TOPICAL

## 2018-09-01 MED ORDER — PENTAFLUOROPROP-TETRAFLUOROETH EX AERO: 1.0000 "application " | INHALATION_SPRAY | CUTANEOUS | Status: DC | PRN

## 2018-09-01 MED ORDER — PREDNISONE 10 MG PO TABS
10.0000 mg | ORAL_TABLET | Freq: Every morning | ORAL | Status: DC
  Administered 2018-09-02 – 2018-09-03 (×2): 10 mg via ORAL
  Filled 2018-09-01 (×2): qty 1

## 2018-09-01 MED ORDER — SIMVASTATIN 20 MG PO TABS
20.0000 mg | ORAL_TABLET | Freq: Every day | ORAL | Status: DC
  Administered 2018-09-02 (×2): 20 mg via ORAL
  Filled 2018-09-01 (×2): qty 1

## 2018-09-01 MED ORDER — OXYCODONE HCL 5 MG PO TABS: 5.0000 mg | ORAL_TABLET | Freq: Four times a day (QID) | ORAL | Status: DC | PRN

## 2018-09-01 MED ORDER — PANTOPRAZOLE SODIUM 40 MG PO TBEC
40.0000 mg | DELAYED_RELEASE_TABLET | Freq: Two times a day (BID) | ORAL | Status: DC
  Administered 2018-09-02 – 2018-09-03 (×4): 40 mg via ORAL
  Filled 2018-09-01 (×4): qty 1

## 2018-09-01 MED ORDER — HYDRALAZINE HCL 50 MG PO TABS
50.0000 mg | ORAL_TABLET | Freq: Three times a day (TID) | ORAL | Status: DC
  Administered 2018-09-01: 50 mg via ORAL
  Filled 2018-09-01: qty 1
  Filled 2018-09-01: qty 2
  Filled 2018-09-01 (×2): qty 1

## 2018-09-01 MED ORDER — INSULIN GLARGINE 100 UNIT/ML ~~LOC~~ SOLN
7.0000 [IU] | Freq: Every day | SUBCUTANEOUS | Status: DC
  Administered 2018-09-02: 7 [IU] via SUBCUTANEOUS
  Filled 2018-09-01 (×3): qty 0.07

## 2018-09-01 MED ORDER — HEPARIN SODIUM (PORCINE) 5000 UNIT/ML IJ SOLN
5000.0000 [IU] | Freq: Three times a day (TID) | INTRAMUSCULAR | Status: DC
  Administered 2018-09-02 – 2018-09-03 (×4): 5000 [IU] via SUBCUTANEOUS
  Filled 2018-09-01 (×4): qty 1

## 2018-09-01 MED ORDER — HYDRALAZINE HCL 20 MG/ML IJ SOLN
5.0000 mg | Freq: Once | INTRAMUSCULAR | Status: AC
  Administered 2018-09-01: 5 mg via INTRAVENOUS
  Filled 2018-09-01: qty 1

## 2018-09-01 MED ORDER — DICYCLOMINE HCL 20 MG PO TABS
20.0000 mg | ORAL_TABLET | Freq: Two times a day (BID) | ORAL | Status: DC
  Administered 2018-09-02 – 2018-09-03 (×3): 20 mg via ORAL
  Filled 2018-09-01 (×3): qty 1

## 2018-09-01 MED ORDER — PROMETHAZINE HCL 25 MG/ML IJ SOLN
25.0000 mg | Freq: Once | INTRAMUSCULAR | Status: AC
  Administered 2018-09-01: 25 mg via INTRAVENOUS
  Filled 2018-09-01: qty 1

## 2018-09-01 MED ORDER — INSULIN ASPART 100 UNIT/ML ~~LOC~~ SOLN
0.0000 [IU] | Freq: Three times a day (TID) | SUBCUTANEOUS | Status: DC
  Administered 2018-09-02: 5 [IU] via SUBCUTANEOUS
  Administered 2018-09-02: 1 [IU] via SUBCUTANEOUS
  Administered 2018-09-03: 9 [IU] via SUBCUTANEOUS
  Administered 2018-09-03: 1 [IU] via SUBCUTANEOUS

## 2018-09-01 MED ORDER — HEPARIN SODIUM (PORCINE) 1000 UNIT/ML DIALYSIS
1000.0000 [IU] | INTRAMUSCULAR | Status: DC | PRN
  Filled 2018-09-01: qty 1

## 2018-09-01 MED ORDER — SODIUM CHLORIDE 0.9 % IV BOLUS
500.0000 mL | Freq: Once | INTRAVENOUS | Status: AC
  Administered 2018-09-01: 500 mL via INTRAVENOUS

## 2018-09-01 MED ORDER — ACETAMINOPHEN 325 MG PO TABS: 650.0000 mg | ORAL_TABLET | Freq: Four times a day (QID) | ORAL | Status: DC | PRN

## 2018-09-01 NOTE — ED Notes (Addendum)
Pt oxygen level was 81 on room air. This tech notified Percell Locus, RN, RN approved to have pt on nasal canula 2 liters.  Pt is now at 100% on room air with nasal canula on at 2 liters.

## 2018-09-01 NOTE — ED Notes (Signed)
Unable to pull insulin from pyxis; will start zosyn and transport pt to 5W.

## 2018-09-01 NOTE — ED Notes (Signed)
Called brother Purcell Nails in attempt to give pt update; unable to reach him due. Unable to leave voicemail; message stating mailbox was full.

## 2018-09-01 NOTE — Consult Note (Signed)
Reason for Consult: To manage dialysis and dialysis related needs Referring Physician: Dr Hermenia Fiscal IRAIS MOTTRAM is an 33 y.o. female.  HPI: Pt is a 43F with a PMH sig for ESRD on HD, failed Ktx, and XXX syndrome with learning disability who is now seen in consultation at the request of Dr. Nevada Crane for evaluation and recommendations surrounding management of ESRD and provision of HD.   Pt has presented to the ED for abd pain, n/v.  States that it has been present since Wednesday and worsening.  She notes R flank pain that is sharp in nature.  She is unable to provide many more qualifying factors to the pain as she is quite uncomfortable now.  She has been on Augmentin for a UTI since at leat 08/21/18.  It was noted in dialysis 6/17 that her symptoms weren't improving and a repeat UA and culture were ordered; unfortunately, they were cancelled.  There was also some concern for chronic rejection so it was suggested she get a CT scan.    She went to dialysis today and had a full treatment.  Post-BP was 175/ 112.  Was essentially at EDW and had minimal UF.  Per notes, had an episode of vomiting at dialysis.    In ED, pt was found to be hypertensive to 218/128, CXR with some pulm edema.  Labs showed Na 135, K 3.0, CO2 26 BUN 11 Cr 2.76 blood glucose 240, Ca 10.4.  WBC ct 10.2, Hgb 9.3, plts WNL.  CT scan with RLQ kidney transplant with moderate hydronephrosis, subcapsular seroma (unchanged), edema, and no renal stones.    Started on Zosyn in the ED. And given IV labetalol. UA hasn't been collected yet.      Dialyzes at [facility] MWF 4 hrs F180 EDW 78.5 BFR 350 2K/ 3.5 Ca bath Heparin 4000 u bolus 2000 u mid-run Mircera 150 mcg q 2 weeks  Past Medical History:  Diagnosis Date  . Chronic kidney disease    kidney transplant 07  . Diabetes mellitus    Pt reports diagnosis in June 2011  . Hyperlipidemia   . Hypertension   . Kidney transplant recipient    solitary kidney  . 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 Pyelonephritis 06/23/2014  . UTI (urinary tract infection) 01/09/2015  . 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 ARTERIOVENOUS GRAFT PLACEMENT Bilateral    "neither work" (10/24/2017)  . CHOLECYSTECTOMY N/A 06/30/2017   Procedure: LAPAROSCOPIC CHOLECYSTECTOMY WITH INTRAOPERATIVE CHOLANGIOGRAM;  Surgeon: Excell Seltzer, MD;  Location: [facility] ORS;  Service: General;  Laterality: N/A;  . ESOPHAGOGASTRODUODENOSCOPY (EGD) WITH PROPOFOL N/A 07/04/2017   Procedure: ESOPHAGOGASTRODUODENOSCOPY (EGD) WITH PROPOFOL;  Surgeon: Clarene Essex, MD;  Location: [facility] ENDOSCOPY;  Service: Endoscopy;  Laterality: N/A;  . INSERTION OF DIALYSIS CATHETER N/A 03/20/2018   Procedure: INSERTION OF TUNNELED DIALYSIS CATHETER - RIGHT INTERANL JUGULAR PLACEMENT;  Surgeon: Angelia Mould, MD;  Location: [facility];  Service: Vascular;  Laterality: N/A;  . KIDNEY TRANSPLANT  2007  . PARATHYROIDECTOMY  ?2012   "3/4 removed" (10/24/2017)  . RENAL BIOPSY Bilateral 2003    Family History  Problem Relation Age of Onset  . Arthritis Mother   . Diabetes Mother   . Hypertension Mother     Social History:  reports that  she has never smoked. She has never used smokeless tobacco. She reports that she does not drink alcohol or use drugs.  Allergies:  Allergies  Allergen Reactions  . Benadryl [Diphenhydramine-Zinc Acetate] Shortness Of Breath  . Motrin [Ibuprofen] Shortness Of Breath and Itching    Per pt  . Banana Other (See Comments)    Sick on the stomach  . Diphenhydramine Hcl     REACTION: Stopped breathing in ICU  . Doxycycline     Shortness of Breath   . Iron Dextran     REACTION: vein irritation  . Shellfish Allergy Hives    Medications:  Scheduled: . amLODipine  10 mg Oral Daily  . carvedilol  12.5 mg Oral BID WC  . dicyclomine  20 mg  Oral BID  . heparin injection (subcutaneous)  5,000 Units Subcutaneous Q8H  . hydrALAZINE  50 mg Oral Q8H  . insulin aspart  0-5 Units Subcutaneous QHS  . insulin aspart  0-9 Units Subcutaneous TID WC  . insulin glargine  7 Units Subcutaneous Daily  . metoCLOPramide  5 mg Oral BID  . pantoprazole  40 mg Oral BID  . [START ON 09/02/2018] predniSONE  10 mg Oral q morning - 10a  . senna-docusate  2 tablet Oral QHS  . simvastatin  20 mg Oral QHS    Results for orders placed or performed during the hospital encounter of 09/01/18 (from the past 48 hour(s))  CBC with Differential/Platelet     Status: Abnormal   Collection Time: 09/01/18  1:24 PM  Result Value Ref Range   WBC 10.2 4.0 - 10.5 K/uL   RBC 3.50 (L) 3.87 - 5.11 MIL/uL   Hemoglobin 9.2 (L) 12.0 - 15.0 g/dL   HCT 30.7 (L) 36.0 - 46.0 %   MCV 87.7 80.0 - 100.0 fL   MCH 26.3 26.0 - 34.0 pg   MCHC 30.0 30.0 - 36.0 g/dL   RDW 15.5 11.5 - 15.5 %   Platelets 316 150 - 400 K/uL   nRBC 0.0 0.0 - 0.2 %   Neutrophils Relative % 77 %   Neutro Abs 7.9 (H) 1.7 - 7.7 K/uL   Lymphocytes Relative 15 %   Lymphs Abs 1.5 0.7 - 4.0 K/uL   Monocytes Relative 5 %   Monocytes Absolute 0.5 0.1 - 1.0 K/uL   Eosinophils Relative 2 %   Eosinophils Absolute 0.2 0.0 - 0.5 K/uL   Basophils Relative 0 %   Basophils Absolute 0.0 0.0 - 0.1 K/uL   Immature Granulocytes 1 %   Abs Immature Granulocytes 0.06 0.00 - 0.07 K/uL    Comment: Performed at [facility], [address] [place], [place] [address]  Comprehensive metabolic panel     Status: Abnormal   Collection Time: 09/01/18  1:24 PM  Result Value Ref Range   Sodium 135 135 - 145 mmol/L   Potassium 3.0 (L) 3.5 - 5.1 mmol/L   Chloride 96 (L) 98 - 111 mmol/L   CO2 26 22 - 32 mmol/L   Glucose, Bld 240 (H) 70 - 99 mg/dL   BUN 11 6 - 20 mg/dL   Creatinine, Ser 2.97 (H) 0.44 - 1.00 mg/dL   Calcium 10.4 (H) 8.9 - 10.3 mg/dL   Total Protein 8.2 (H) 6.5 - 8.1 g/dL   Albumin 3.4 (L) 3.5 - 5.0  g/dL   AST 16 15 - 41 U/L   ALT 11 0 - 44 U/L   Alkaline Phosphatase 42 38 - 126  U/L   Total Bilirubin 0.7 0.3 - 1.2 mg/dL   GFR calc non Af Amer 20 (L) >60 mL/min   GFR calc Af Amer 23 (L) >60 mL/min   Anion gap 13 5 - 15    Comment: Performed at [facility] [address], [place], [place] [address]  Lipase, blood     Status: Abnormal   Collection Time: 09/01/18  1:24 PM  Result Value Ref Range   Lipase 70 (H) 11 - 51 U/L    Comment: Performed at [facility] [address] [place], [place] [address]  I-stat chem 8, ED (not at [facility] or [facility])     Status: Abnormal   Collection Time: 09/01/18  1:47 PM  Result Value Ref Range   Sodium 136 135 - 145 mmol/L   Potassium 3.1 (L) 3.5 - 5.1 mmol/L   Chloride 98 98 - 111 mmol/L   BUN 13 6 - 20 mg/dL   Creatinine, Ser 2.80 (H) 0.44 - 1.00 mg/dL   Glucose, Bld 243 (H) 70 - 99 mg/dL   Calcium, Ion 1.22 1.15 - 1.40 mmol/L   TCO2 32 22 - 32 mmol/L   Hemoglobin 10.5 (L) 12.0 - 15.0 g/dL   HCT 31.0 (L) 36.0 - 46.0 %  POCT I-Stat EG7     Status: Abnormal   Collection Time: 09/01/18  1:47 PM  Result Value Ref Range   pH, Ven 7.596 (H) 7.250 - 7.430   pCO2, Ven 34.4 (L) 44.0 - 60.0 mmHg   pO2, Ven 113.0 (H) 32.0 - 45.0 mmHg   Bicarbonate 33.4 (H) 20.0 - 28.0 mmol/L   TCO2 34 (H) 22 - 32 mmol/L   O2 Saturation 99.0 %   Acid-Base Excess 11.0 (H) 0.0 - 2.0 mmol/L   Sodium 137 135 - 145 mmol/L   Potassium 3.1 (L) 3.5 - 5.1 mmol/L   Calcium, Ion 1.24 1.15 - 1.40 mmol/L   HCT 32.0 (L) 36.0 - 46.0 %   Hemoglobin 10.9 (L) 12.0 - 15.0 g/dL   Patient temperature HIDE    Sample type VENOUS   Lactic acid, plasma     Status: None   Collection Time: 09/01/18  5:43 PM  Result Value Ref Range   Lactic Acid, Venous 1.1 0.5 - 1.9 mmol/L    Comment: Performed at [facility], [place] [address], [place], [place] [address]  CBG monitoring, ED     Status: Abnormal   Collection Time: 09/01/18  5:54 PM  Result Value Ref Range    Glucose-Capillary 322 (H) 70 - 99 mg/dL   Comment 1 Notify RN    Comment 2 Document in Chart     Dg Chest Port 1 View  Result Date: 09/01/2018 CLINICAL DATA:  Back and right flank pain. EXAM: PORTABLE CHEST 1 VIEW COMPARISON:  05/01/2018 FINDINGS: Large bore central venous catheter in stable position. Cardiomediastinal silhouette is stably enlarged. Mediastinal contours appear intact. Low lung volumes. Bibasilar streaky airspace opacities likely represent atelectasis. Osseous structures are without acute abnormality. Soft tissues are grossly normal. IMPRESSION: 1. Low lung volumes with bibasilar atelectasis. 2. Stably enlarged cardiac silhouette. Electronically Signed   By: Fidela Salisbury M.D.   On: 09/01/2018 15:00   Ct Renal Stone Study  Result Date: 09/01/2018 CLINICAL DATA:  Back and flank pain, vomiting, history of renal transplant, rejection and dialysis EXAM: CT ABDOMEN AND PELVIS WITHOUT CONTRAST TECHNIQUE: Multidetector CT imaging of the abdomen and pelvis was performed following the  standard protocol without IV contrast. COMPARISON:  11/11/2017 FINDINGS: Lower chest: Bibasilar bandlike scarring or atelectasis. Hepatobiliary: No focal liver abnormality is seen. Status post cholecystectomy. No biliary dilatation. Pancreas: Unremarkable. No pancreatic ductal dilatation or surrounding inflammatory changes. Spleen: Normal in size without significant abnormality. Adrenals/Urinary Tract: Adrenal glands are unremarkable. Very atrophic native left kidney with absent native right kidney. The right lower quadrant renal transplant graft is edematous with moderate hydronephrosis. There is no obstructing calculus or other lesion appreciated of the transplant graft ureter, however visualization is limited due to configuration. There is an unchanged subcapsular seroma or cyst about the midportion of graft. Bladder is unremarkable. Stomach/Bowel: Stomach is within normal limits. Appendix appears normal. No  evidence of bowel wall thickening, distention, or inflammatory changes. Vascular/Lymphatic: No significant vascular findings are present. No enlarged abdominal or pelvic lymph nodes. Reproductive: No mass or other significant abnormality. Other: No abdominal wall hernia or abnormality. No abdominopelvic ascites. Musculoskeletal: No acute or significant osseous findings. IMPRESSION: 1. The right lower quadrant renal transplant graft is edematous with adjacent fat stranding and moderate hydronephrosis. There is no obstructing calculus or other lesion appreciated of the transplant graft ureter, however visualization is limited due to configuration. Lasix nuclear renogram may be used evaluate for functional transplant ureter stricture if desired. 2. Other chronic, incidental, and postoperative findings as detailed above. Electronically Signed   By: Eddie Candle M.D.   On: 09/01/2018 14:46    ROS: all other systems reviewed and are negative except as per HPI  **Blood pressure (!) 199/131, pulse 91, temperature 98.4 F (36.9 C), temperature source Oral, resp. rate (!) 21, weight 83.5 kg, SpO2 100 %. .  GEN sitting in bed, appears uncomfortable HEENT EOMI PERRL NECK no JVD PULM clear anteriorly, not taking a deep breath d/t pain CV tachycardic no m/r/g ABD soft, RLQ graft with tenderness EXT trace LE edema NEURO AAO x 3 SKIN no rashes ACCESS: R IJ TDC   Assessment/Plan: 1 R flank pain/ n/v: considerations include pyelo, rejection.  Think less likely passed kidney stone as pain still present.  Blood cultures drawn, Zosyn started.  UA may be helpful to differentiate between rejection (with hematuria) or pyelo. 2 ESRD: MWF, had full rx today.  Will provide extra rx tomorrow for vol removal 3 Hypertension: initially elevated, got IV labetalol, now placed back on home amlodipine, carvedilol, and hydralazine.   4. Anemia of ESRD: last Hgb 9.2, on mircera, last given 6/15 5. Metabolic Bone Disease: on  added Ca bath.  Ca acetate as binder 6.  Failed Ktx: on pred 10 mg daily; myfortic 720 mg BID and tacrolimus 1 mg BID listed on dialysis med list so will need to confirm  Madelon Lips 09/01/2018, 7:54 PM

## 2018-09-01 NOTE — ED Triage Notes (Signed)
Pt aaivwes via ems from home , moaning and crying saying her back and rt flank hurt , pt had kidney transplant in 07 and since jan of this year has been in re jection started dialysis  ( MWF)again in [place], pt states vomiting since wed

## 2018-09-01 NOTE — H&P (Signed)
History and Physical  [name]:[id number] DOB: 07/06/1985 DOA: 09/01/2018  Referring physician: Dr. Darl Householder PCP: [name], MD  Outpatient Specialists: Nephrology Patient coming from: Home  Chief Complaint: Nausea and vomiting and right flank pain  HPI: [name] is a 33 y.o. female with medical history significant for end-stage renal disease on hemodialysis Monday Wednesday Friday, post kidney transplant at the age of 52 on Prograf and chronic steroids, insulin-dependent diabetes, gastroparesis, anemia of chronic disease, hypertension, who presented to [facility] ED with complaints of nausea vomiting and right flank pain since Wednesday intermittent initially, then gradually worsening.  Pain is now constant.  No aggravating or worsening factors.  She was at hemodialysis today vomited there and was referred to the ED for further evaluation.  Denies fever, chills, diarrhea, or abdominal cramping.  Has had very poor oral intake due to this.  She is unable to elaborate further due to severe flank pain.  ED Course: On presentation to the ED blood pressure significantly elevated 212/135 with tachycardia rate of 111.  She is in significant pain and cannot sit still.  CT renal stone study showed the right lower quadrant renal transplant graft is edematous with adjacent fat stranding and moderate hydronephrosis.  Lab studies remarkable for mildly elevated WBC compared to baseline and elevated neutrophil count.  Will obtain lactic acid, blood cultures x2 peripherally, and start on IV Zosyn empirically.  TR[facility] asked to admit.  Review of Systems: Review of systems as noted in the HPI. All other systems reviewed and are negative.   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   Pyelonephritis 06/23/2014   UTI (urinary tract infection) 01/09/2015   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   Qualifier: Diagnosis of  By: Carlena Sax  MD, Colletta [place]     Past Surgical History:  Procedure Laterality Date   ARTERIOVENOUS GRAFT PLACEMENT Bilateral    "neither work" (10/24/2017)   CHOLECYSTECTOMY N/A 06/30/2017   Procedure: LAPAROSCOPIC CHOLECYSTECTOMY WITH INTRAOPERATIVE CHOLANGIOGRAM;  Surgeon: Excell Seltzer, MD;  Location: [facility];  Service: General;  Laterality: N/A;   ESOPHAGOGASTRODUODENOSCOPY (EGD) WITH PROPOFOL N/A 07/04/2017   Procedure: ESOPHAGOGASTRODUODENOSCOPY (EGD) WITH PROPOFOL;  Surgeon: Clarene Essex, MD;  Location: [facility];  Service: Endoscopy;  Laterality: N/A;   INSERTION OF DIALYSIS CATHETER N/A 03/20/2018   Procedure: INSERTION OF TUNNELED DIALYSIS CATHETER - RIGHT INTERANL JUGULAR PLACEMENT;  Surgeon: Angelia Mould, MD;  Location: [place];  Service: Vascular;  Laterality: N/A;   KIDNEY TRANSPLANT  2007   PARATHYROIDECTOMY  ?2012   "3/4 removed" (10/24/2017)   RENAL BIOPSY Bilateral 2003    Social History:  reports that she has never smoked. She has never used smokeless tobacco. She reports that she does not drink alcohol or use drugs.   Allergies  Allergen Reactions   Benadryl [Diphenhydramine-Zinc Acetate] Shortness Of Breath   Motrin [Ibuprofen] Shortness Of Breath and Itching    Per pt   Banana Other (See Comments)    Sick on the stomach   Diphenhydramine Hcl     REACTION: Stopped breathing in ICU   Doxycycline     Shortness of Breath  Iron Dextran     REACTION: vein irritation   Shellfish Allergy Hives    Family History  Problem Relation Age of Onset   Arthritis Mother    Diabetes Mother    Hypertension Mother      Prior to Admission medications   Medication Sig Start Date End Date Taking? Authorizing Provider  ACCU-CHEK SOFTCLIX LANCETS  lancets Use to check blood sugar 4 times per day. 12/29/15   Renato Shin, MD  acetaminophen (TYLENOL) 500 MG tablet Take 1,000 mg by mouth daily as needed for moderate pain.     [provider]  amLODipine (NORVASC) 10 MG tablet Take 1 tablet (10 mg total) by mouth daily. 03/25/18   Thurnell Lose, MD  calcitRIOL (ROCALTROL) 0.25 MCG capsule Take 0.25 mcg by mouth daily.    [provider]  calcium acetate (PHOSLO) 667 MG capsule Take 1,334 mg by mouth 3 (three) times daily with meals.    [provider]  Calcium Carb-Cholecalciferol (CALCIUM 1000 + D PO) Take 1 tablet by mouth daily.     [provider]  calcium elemental as carbonate (BARIATRIC TUMS ULTRA) 400 MG chewable tablet Chew 1,000 mg by mouth 3 (three) times daily as needed for heartburn.     [provider]  carvedilol (COREG) 12.5 MG tablet Take 12.5 mg by mouth 2 (two) times daily with a meal.    [provider]  dicyclomine (BENTYL) 20 MG tablet Take 1 tablet (20 mg total) by mouth 2 (two) times daily. 02/06/18   [name], [name], MD  famotidine (PEPCID) 10 MG tablet Take 10 mg by mouth daily.    [provider]  fluticasone (FLONASE) 50 MCG/ACT nasal spray Place 2 sprays into both nostrils daily. Patient taking differently: Place 2 sprays into both nostrils daily as needed for allergies.  07/20/15   Muthersbaugh, Jarrett Soho, PA-C  gabapentin (NEURONTIN) 300 MG capsule Take 300 mg by mouth 3 (three) times daily.    [provider]  hydrALAZINE (APRESOLINE) 50 MG tablet Take 1 tablet (50 mg total) by mouth every 8 (eight) hours. 03/24/18   Thurnell Lose, MD  hydrOXYzine (ATARAX/VISTARIL) 50 MG tablet Take 1 tablet (50 mg total) by mouth 2 (two) times daily as needed for itching. 02/06/18   [name], Ripudeep K, MD  insulin aspart (NOVOLOG FLEXPEN) 100 UNIT/ML FlexPen Inject 12-15 Units into the skin See admin instructions. 5-6 times per day as needed    [provider]  insulin degludec (TRESIBA) 100 UNIT/ML SOPN FlexTouch Pen Inject 0.6 mLs (60 Units total) into the skin 2 (two) times daily. Reports taking 24 units QAM 03/06/18   Charlynne Cousins, MD  loperamide (IMODIUM) 2 MG capsule Take 2 mg by mouth as needed for diarrhea or loose stools.     [provider]  metoCLOPramide (REGLAN) 10 MG tablet Take 1 tablet (10 mg total) by mouth 3 (three) times daily before meals. 02/06/18   [name], [name], MD  mycophenolate (MYFORTIC) 360 MG TBEC Take 720 mg by mouth 2 (two) times daily.     [provider]  ondansetron (ZOFRAN ODT) 4 MG disintegrating tablet Take 1 tablet (4 mg total) by mouth every 4 (four) hours as needed for nausea or vomiting. 10/26/17   Denton Brick, Courage, MD  pantoprazole (PROTONIX) 40 MG tablet Take 40 mg by mouth 2 (two) times daily.    [provider]  predniSONE (DELTASONE) 10 MG tablet Take 1 tablet (10 mg  total) by mouth every morning. 03/06/18   Charlynne Cousins, MD  promethazine (PHENERGAN) 25 MG tablet Take 1 tablet (25 mg total) by mouth every 6 (six) hours as needed for nausea or vomiting. 02/06/18   [name], [name], MD  simvastatin (ZOCOR) 20 MG tablet Take 20 mg by mouth at bedtime.     [provider]  sodium bicarbonate 650 MG tablet Take 650 mg by mouth 3 (three) times daily.    [provider]  tacrolimus (PROGRAF) 1 MG capsule Take 3 mg by mouth 2 (two) times daily.     [provider]  torsemide (DEMADEX) 20 MG tablet Take 4 tablets (80 mg total) by mouth 2 (two) times daily. 03/05/18   Charlynne Cousins, MD    Physical Exam: BP (!) 211/123    Pulse (!) 111    Temp 98.4 F (36.9 C) (Oral)    Resp (!) 22    SpO2 100%    General: 33 y.o. year-old female well developed well nourished in no acute distress.  Alert and oriented x3.  Very uncomfortable due to severe right flank pain.  Cardiovascular: Regular rate and rhythm with no rubs or gallops.  No  thyromegaly or JVD noted.  No lower extremity edema. 2/4 pulses in all 4 extremities.  Respiratory: Clear to auscultation with no wheezes or rales. Good inspiratory effort.  Abdomen: Soft nontender nondistended with normal bowel sounds x4 quadrants.  Tender on mild palpation of right flank.  Muskuloskeletal: No cyanosis, clubbing or edema noted bilaterally  Neuro: CN II-XII intact, strength, sensation, reflexes  Skin: No ulcerative lesions noted or rashes  Psychiatry: Judgement and insight appear normal. Mood is appropriate for condition and setting          Labs on Admission:  Basic Metabolic Panel: Recent Labs  Lab 09/01/18 1324 09/01/18 1347  NA 135 136   137  K 3.0* 3.1*   3.1*  CL 96* 98  CO2 26  --   GLUCOSE 240* 243*  BUN 11 13  CREATININE 2.97* 2.80*  CALCIUM 10.4*  --    Liver Function Tests: Recent Labs  Lab 09/01/18 1324  AST 16  ALT 11  ALKPHOS 42  BILITOT 0.7  PROT 8.2*  ALBUMIN 3.4*   Recent Labs  Lab 09/01/18 1324  LIPASE 70*   No results for input(s): AMMONIA in the last 168 hours. CBC: Recent Labs  Lab 09/01/18 1324 09/01/18 1347  WBC 10.2  --   NEUTROABS 7.9*  --   HGB 9.2* 10.5*   10.9*  HCT 30.7* 31.0*   32.0*  MCV 87.7  --   PLT 316  --    Cardiac Enzymes: No results for input(s): CKTOTAL, CKMB, CKMBINDEX, TROPONINI in the last 168 hours.  BNP (last 3 results) Recent Labs    03/01/18 1100 05/01/18 1013  BNP 63.3 433.7*    ProBNP (last 3 results) No results for input(s): PROBNP in the last 8760 hours.  CBG: No results for input(s): GLUCAP in the last 168 hours.  Radiological Exams on Admission: Dg Chest Port 1 View  Result Date: 09/01/2018 CLINICAL DATA:  Back and right flank pain. EXAM: PORTABLE CHEST 1 VIEW COMPARISON:  05/01/2018 FINDINGS: Large bore central venous catheter in stable position. Cardiomediastinal silhouette is stably enlarged. Mediastinal contours appear intact. Low lung volumes. Bibasilar streaky  airspace opacities likely represent atelectasis. Osseous structures are without acute abnormality. Soft tissues are grossly normal. IMPRESSION: 1. Low lung volumes with  bibasilar atelectasis. 2. Stably enlarged cardiac silhouette. Electronically Signed   By: Fidela Salisbury M.D.   On: 09/01/2018 15:00   Ct Renal Stone Study  Result Date: 09/01/2018 CLINICAL DATA:  Back and flank pain, vomiting, history of renal transplant, rejection and dialysis EXAM: CT ABDOMEN AND PELVIS WITHOUT CONTRAST TECHNIQUE: Multidetector CT imaging of the abdomen and pelvis was performed following the standard protocol without IV contrast. COMPARISON:  11/11/2017 FINDINGS: Lower chest: Bibasilar bandlike scarring or atelectasis. Hepatobiliary: No focal liver abnormality is seen. Status post cholecystectomy. No biliary dilatation. Pancreas: Unremarkable. No pancreatic ductal dilatation or surrounding inflammatory changes. Spleen: Normal in size without significant abnormality. Adrenals/Urinary Tract: Adrenal glands are unremarkable. Very atrophic native left kidney with absent native right kidney. The right lower quadrant renal transplant graft is edematous with moderate hydronephrosis. There is no obstructing calculus or other lesion appreciated of the transplant graft ureter, however visualization is limited due to configuration. There is an unchanged subcapsular seroma or cyst about the midportion of graft. Bladder is unremarkable. Stomach/Bowel: Stomach is within normal limits. Appendix appears normal. No evidence of bowel wall thickening, distention, or inflammatory changes. Vascular/Lymphatic: No significant vascular findings are present. No enlarged abdominal or pelvic lymph nodes. Reproductive: No mass or other significant abnormality. Other: No abdominal wall hernia or abnormality. No abdominopelvic ascites. Musculoskeletal: No acute or significant osseous findings. IMPRESSION: 1. The right lower quadrant renal transplant  graft is edematous with adjacent fat stranding and moderate hydronephrosis. There is no obstructing calculus or other lesion appreciated of the transplant graft ureter, however visualization is limited due to configuration. Lasix nuclear renogram may be used evaluate for functional transplant ureter stricture if desired. 2. Other chronic, incidental, and postoperative findings as detailed above. Electronically Signed   By: Eddie Candle M.D.   On: 09/01/2018 14:46    EKG: I independently viewed the EKG done and my findings are as followed: Sinus tachycardia with a rate of 111.  No specific ST-T changes.  Assessment/Plan Present on Admission:  Hypertensive emergency  Active Problems:   Hypertensive emergency  Hypertensive emergency with flash pulmonary edema Presented with blood pressure 212/135 and a heart rate of 111 Chest x-ray on admission shows cardiomegaly with increase in pulmonary vascularity suggestive of pulmonary edema Last hemodialysis was today 09/01/2018 Nephrology consulted per ED physician Dr. Darl Householder. Start IV labetalol 10 mg every 4 hours as needed for systolic blood pressure greater than 180 Decrease blood pressure carefully no more than 20% in the next 24 hours Vital signs every 4 hours  Right pyelonephritis from kidney transplant CT renal stone revealed findings on the right kidney transplant Patient follows at [facility] for her transplant She is on Prograf and steroids Started on IV Zosyn Obtain blood cultures x2 peripherally Obtain urine culture if can be obtained Monitor fever curve and WBC Repeat CBC with differentials tomorrow  Flash Pulmonary edema likely secondary to hypertensive emergency Maintain O2 saturation greater than 92% Fluid status will be adjusted at dialysis Independently reviewed chest x-ray done on admission which showed cardiomegaly with increase in pulmonary vascularity which are stable pulmonary edema. Obtain BNP and if elevated order 2D  echo  Intractable nausea and vomiting likely secondary to gastroparesis versus right pyelonephritis Antiemetics in place as needed Clear liquid diet as tolerated No IV fluid due to ESRD   Insulin-dependent diabetes complicated by gastroparesis and hyperglycemia Obtain hemoglobin A1c Last hemoglobin A1c was on 05/02/2018 at 10.8 Start insulin sliding scale Avoid hypoglycemia in the setting of end-stage  renal disease on dialysis  End-stage renal disease on dialysis Monday Wednesday Friday Last hemodialysis was on Friday, 09/01/2018 Nephrology consulted by ED provider  Hypokalemia Presented with potassium 3.1 Repleted cautiously in the setting of end-stage renal disease with KCl IV 10 mEq x 2 Repeat BMP in the morning  Sinus tachycardia Presented with heart rate of 111 Likely secondary to dehydration from poor oral intake and vomiting Continue to monitor IV antiemetic as needed  Anemia of chronic disease Hemoglobin stable at 10.5 post hemodialysis Defer management to nephrology  Post kidney transplant at the age of 89 On Prograf and steroids We will resume steroids Hold Prograf for now Follows at [facility]   Risks: High risk for decompensation due to acute pyelonephritis in the setting of right kidney transplant on immunosuppressant, and multiple comorbidities.  Patient will require at least 2 midnights for further evaluation and treatment of present condition.   DVT prophylaxis: Subcu heparin 3 times daily  Code Status: Full code  Family Communication: We will call if okay with the patient.  Disposition Plan: Admit to stepdown unit  Consults called: Nephrology will be consulted by ED provider, Dr. Darl Householder.  Admission status: Inpatient status    Kayleen Memos MD [facility] Pager [phone]  If 7PM-7AM, please contact night-coverage [email or web address] Password [facility]  09/01/2018, 4:43 PM

## 2018-09-01 NOTE — ED Provider Notes (Signed)
[facility] EMERGENCY DEPARTMENT Provider Note   CSN: [id number] Arrival date & time: 09/01/18  1316     History   Chief Complaint Chief Complaint  Patient presents with  . Abdominal Pain  . Flank Pain    HPI [name] is a 33 y.o. 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  Patient had kidney transplant when she was very young and the transplant has failed so she started back on dialysis since January of this year.  She does have a history of diabetes with gastroparesis.  She has been vomiting for the last 2 weeks intermittently.  Today, she went to dialysis and finished dialysis but then started vomiting again so sent here for evaluation.  Patient also complained of diffuse abdominal pain as well.  Denies any fevers or chills patient states that she takes antiemetics at home but does not remember what she is on.      The history is provided by the patient.    Past Medical History:  Diagnosis Date  . Chronic kidney disease    kidney transplant 07  . Diabetes mellitus    Pt reports diagnosis in June 2011  . Hyperlipidemia   . Hypertension   . Kidney transplant recipient    solitary kidney  . 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 Pyelonephritis 06/23/2014  . UTI (urinary tract infection) 01/09/2015  . 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 DKA (diabetic ketoacidosis) ([place]) 05/01/2018  . ESRD on hemodialysis ([place]) 05/01/2018  . Renal failure 03/18/2018  . Cellulitis of left leg 03/01/2018  . Hyperlipidemia 03/01/2018  . Hypocalcemia 03/01/2018  . Metabolic acidosis 62/22/9798  . Hypokalemia 02/03/2018  . Incontinence of bowel 02/03/2018  . Chronic pain 11/11/2017  . Gastroparesis   . Constipation 07/13/2017   . Hematemesis 07/02/2017  . Chronic cholecystitis 06/29/2017  . Type 1 diabetes mellitus with complication, uncontrolled ([place]) 05/25/2015  . Nausea & vomiting 01/09/2015  . Renal transplant recipient   . Immunosuppressed status ([place])   . 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 Chronic kidney disease 01/04/2010  . Chronic kidney disease, stage II (mild) 01/04/2010  . OVARIAN FAILURE, PREMATURE 03/11/2009  . 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 OBESITY 09/25/2007  . Anemia due to chronic kidney disease 09/25/2007  . LEARNING DISABILITY 09/25/2007  . Essential hypertension, benign 09/25/2007    Past Surgical History:  Procedure Laterality Date  . ARTERIOVENOUS GRAFT PLACEMENT Bilateral    "neither work" (10/24/2017)  . CHOLECYSTECTOMY N/A 06/30/2017   Procedure: LAPAROSCOPIC CHOLECYSTECTOMY WITH INTRAOPERATIVE CHOLANGIOGRAM;  Surgeon: Excell Seltzer, MD;  Location: [facility];  Service: General;  Laterality: N/A;  . ESOPHAGOGASTRODUODENOSCOPY (EGD) WITH PROPOFOL N/A 07/04/2017   Procedure: ESOPHAGOGASTRODUODENOSCOPY (EGD) WITH PROPOFOL;  Surgeon: Clarene Essex, MD;  Location: [facility];  Service: Endoscopy;  Laterality: N/A;  . INSERTION OF DIALYSIS CATHETER N/A 03/20/2018   Procedure: INSERTION OF TUNNELED DIALYSIS CATHETER - RIGHT INTERANL JUGULAR PLACEMENT;  Surgeon: Angelia Mould, MD;  Location: [facility];  Service: Vascular;  Laterality: N/A;  . KIDNEY TRANSPLANT  2007  . PARATHYROIDECTOMY  ?2012   "3/4 removed" (10/24/2017)  .  RENAL BIOPSY Bilateral 2003     OB History   No obstetric history on file.      Home Medications    Prior to Admission medications   Medication Sig Start Date End Date Taking? Authorizing Provider  ACCU-CHEK SOFTCLIX LANCETS lancets Use to check blood sugar 4 times per day. 12/29/15   Renato Shin, MD  acetaminophen (TYLENOL) 500 MG tablet  Take 1,000 mg by mouth daily as needed for moderate pain.     [provider]  amLODipine (NORVASC) 10 MG tablet Take 1 tablet (10 mg total) by mouth daily. 03/25/18   Thurnell Lose, MD  calcitRIOL (ROCALTROL) 0.25 MCG capsule Take 0.25 mcg by mouth daily.    [provider]  calcium acetate (PHOSLO) 667 MG capsule Take 1,334 mg by mouth 3 (three) times daily with meals.    [provider]  Calcium Carb-Cholecalciferol (CALCIUM 1000 + D PO) Take 1 tablet by mouth daily.     [provider]  calcium elemental as carbonate (BARIATRIC TUMS ULTRA) 400 MG chewable tablet Chew 1,000 mg by mouth 3 (three) times daily as needed for heartburn.     [provider]  carvedilol (COREG) 12.5 MG tablet Take 12.5 mg by mouth 2 (two) times daily with a meal.    [provider]  dicyclomine (BENTYL) 20 MG tablet Take 1 tablet (20 mg total) by mouth 2 (two) times daily. 02/06/18   [name], [name], MD  famotidine (PEPCID) 10 MG tablet Take 10 mg by mouth daily.    [provider]  fluticasone (FLONASE) 50 MCG/ACT nasal spray Place 2 sprays into both nostrils daily. Patient taking differently: Place 2 sprays into both nostrils daily as needed for allergies.  07/20/15   Muthersbaugh, Jarrett Soho, PA-C  gabapentin (NEURONTIN) 300 MG capsule Take 300 mg by mouth 3 (three) times daily.    [provider]  hydrALAZINE (APRESOLINE) 50 MG tablet Take 1 tablet (50 mg total) by mouth every 8 (eight) hours. 03/24/18   Thurnell Lose, MD  hydrOXYzine (ATARAX/VISTARIL) 50 MG tablet Take 1 tablet (50 mg total) by mouth 2 (two) times daily as needed for itching. 02/06/18   [name], Ripudeep K, MD  insulin aspart (NOVOLOG FLEXPEN) 100 UNIT/ML FlexPen Inject 12-15 Units into the skin See admin instructions. 5-6 times per day as needed    [provider]  insulin degludec (TRESIBA) 100 UNIT/ML SOPN FlexTouch Pen Inject 0.6 mLs (60 Units total) into the skin 2  (two) times daily. Reports taking 24 units QAM 03/06/18   Charlynne Cousins, MD  loperamide (IMODIUM) 2 MG capsule Take 2 mg by mouth as needed for diarrhea or loose stools.     [provider]  metoCLOPramide (REGLAN) 10 MG tablet Take 1 tablet (10 mg total) by mouth 3 (three) times daily before meals. 02/06/18   [name], [name], MD  mycophenolate (MYFORTIC) 360 MG TBEC Take 720 mg by mouth 2 (two) times daily.     [provider]  ondansetron (ZOFRAN ODT) 4 MG disintegrating tablet Take 1 tablet (4 mg total) by mouth every 4 (four) hours as needed for nausea or vomiting. 10/26/17   Denton Brick, Courage, MD  pantoprazole (PROTONIX) 40 MG tablet Take 40 mg by mouth 2 (two) times daily.    [provider]  predniSONE (DELTASONE) 10 MG tablet Take 1 tablet (10 mg total) by mouth every morning. 03/06/18   Charlynne Cousins, MD  promethazine (PHENERGAN) 25 MG  tablet Take 1 tablet (25 mg total) by mouth every 6 (six) hours as needed for nausea or vomiting. 02/06/18   [name], [name], MD  simvastatin (ZOCOR) 20 MG tablet Take 20 mg by mouth at bedtime.     [provider]  sodium bicarbonate 650 MG tablet Take 650 mg by mouth 3 (three) times daily.    [provider]  tacrolimus (PROGRAF) 1 MG capsule Take 3 mg by mouth 2 (two) times daily.     [provider]  torsemide (DEMADEX) 20 MG tablet Take 4 tablets (80 mg total) by mouth 2 (two) times daily. 03/05/18   Charlynne Cousins, MD    Family History Family History  Problem Relation Age of Onset  . Arthritis Mother   . Diabetes Mother   . Hypertension Mother     Social History Social History   Tobacco Use  . Smoking status: Never Smoker  . Smokeless tobacco: Never Used  Substance Use Topics  . Alcohol use: No  . Drug use: No     Allergies   Benadryl [diphenhydramine-zinc acetate], Motrin [ibuprofen], Banana, Diphenhydramine hcl, Doxycycline, Iron dextran, and Shellfish allergy    Review of Systems Review of Systems  Gastrointestinal: Positive for abdominal pain and vomiting.  Genitourinary: Positive for flank pain.  All other systems reviewed and are negative.    Physical Exam Updated Vital Signs BP (!) 218/128 (BP Location: Left Arm)   Pulse (!) 108   Temp 98.4 F (36.9 C) (Oral)   Resp 19   SpO2 100%   Physical Exam Vitals signs and nursing note reviewed.  HENT:     Head: Normocephalic.     Mouth/Throat:     Comments: MM dry  Eyes:     Extraocular Movements: Extraocular movements intact.  Cardiovascular:     Rate and Rhythm: Normal rate.  Pulmonary:     Effort: Pulmonary effort is normal.     Breath sounds: Normal breath sounds.  Abdominal:     General: Abdomen is flat.     Palpations: Abdomen is soft.     Comments: Mild diffuse tenderness   Skin:    General: Skin is warm.  Neurological:     General: No focal deficit present.     Mental Status: She is alert.  Psychiatric:        Mood and Affect: Mood normal.        Behavior: Behavior normal.      ED Treatments / Results  Labs (all labs ordered are listed, but only abnormal results are displayed) Labs Reviewed  CBC WITH DIFFERENTIAL/PLATELET - Abnormal; Notable for the following components:      Result Value   RBC 3.50 (*)    Hemoglobin 9.2 (*)    HCT 30.7 (*)    Neutro Abs 7.9 (*)    All other components within normal limits  COMPREHENSIVE METABOLIC PANEL - Abnormal; Notable for the following components:   Potassium 3.0 (*)    Chloride 96 (*)    Glucose, Bld 240 (*)    Creatinine, Ser 2.97 (*)    Calcium 10.4 (*)    Total Protein 8.2 (*)    Albumin 3.4 (*)    GFR calc non Af Amer 20 (*)    GFR calc Af Amer 23 (*)    All other components within normal limits  LIPASE, BLOOD - Abnormal; Notable for the following components:   Lipase 70 (*)    All other components within normal  limits  I-STAT CHEM 8, ED - Abnormal; Notable for the following components:   Potassium 3.1  (*)    Creatinine, Ser 2.80 (*)    Glucose, Bld 243 (*)    Hemoglobin 10.5 (*)    HCT 31.0 (*)    All other components within normal limits  POCT I-STAT EG7 - Abnormal; Notable for the following components:   pH, Ven 7.596 (*)    pCO2, Ven 34.4 (*)    pO2, Ven 113.0 (*)    Bicarbonate 33.4 (*)    TCO2 34 (*)    Acid-Base Excess 11.0 (*)    Potassium 3.1 (*)    HCT 32.0 (*)    Hemoglobin 10.9 (*)    All other components within normal limits  SARS CORONAVIRUS 2 (HOSPITAL ORDER, [facility])  TACROLIMUS LEVEL    EKG EKG Interpretation  Date/Time:  Friday September 01 2018 13:23:12 EDT Ventricular Rate:  107 PR Interval:    QRS Duration: 86 QT Interval:  328 QTC Calculation: 438 R Axis:   57 Text Interpretation:  Sinus tachycardia Nonspecific T abnormalities, diffuse leads No significant change since last tracing Confirmed by Wandra Arthurs ([id number]) on 09/01/2018 1:29:30 PM   Radiology Dg Chest Port 1 View  Result Date: 09/01/2018 CLINICAL DATA:  Back and right flank pain. EXAM: PORTABLE CHEST 1 VIEW COMPARISON:  05/01/2018 FINDINGS: Large bore central venous catheter in stable position. Cardiomediastinal silhouette is stably enlarged. Mediastinal contours appear intact. Low lung volumes. Bibasilar streaky airspace opacities likely represent atelectasis. Osseous structures are without acute abnormality. Soft tissues are grossly normal. IMPRESSION: 1. Low lung volumes with bibasilar atelectasis. 2. Stably enlarged cardiac silhouette. Electronically Signed   By: Fidela Salisbury M.D.   On: 09/01/2018 15:00   Ct Renal Stone Study  Result Date: 09/01/2018 CLINICAL DATA:  Back and flank pain, vomiting, history of renal transplant, rejection and dialysis EXAM: CT ABDOMEN AND PELVIS WITHOUT CONTRAST TECHNIQUE: Multidetector CT imaging of the abdomen and pelvis was performed following the standard protocol without IV contrast. COMPARISON:  11/11/2017 FINDINGS: Lower  chest: Bibasilar bandlike scarring or atelectasis. Hepatobiliary: No focal liver abnormality is seen. Status post cholecystectomy. No biliary dilatation. Pancreas: Unremarkable. No pancreatic ductal dilatation or surrounding inflammatory changes. Spleen: Normal in size without significant abnormality. Adrenals/Urinary Tract: Adrenal glands are unremarkable. Very atrophic native left kidney with absent native right kidney. The right lower quadrant renal transplant graft is edematous with moderate hydronephrosis. There is no obstructing calculus or other lesion appreciated of the transplant graft ureter, however visualization is limited due to configuration. There is an unchanged subcapsular seroma or cyst about the midportion of graft. Bladder is unremarkable. Stomach/Bowel: Stomach is within normal limits. Appendix appears normal. No evidence of bowel wall thickening, distention, or inflammatory changes. Vascular/Lymphatic: No significant vascular findings are present. No enlarged abdominal or pelvic lymph nodes. Reproductive: No mass or other significant abnormality. Other: No abdominal wall hernia or abnormality. No abdominopelvic ascites. Musculoskeletal: No acute or significant osseous findings. IMPRESSION: 1. The right lower quadrant renal transplant graft is edematous with adjacent fat stranding and moderate hydronephrosis. There is no obstructing calculus or other lesion appreciated of the transplant graft ureter, however visualization is limited due to configuration. Lasix nuclear renogram may be used evaluate for functional transplant ureter stricture if desired. 2. Other chronic, incidental, and postoperative findings as detailed above. Electronically Signed   By: Eddie Candle M.D.   On: 09/01/2018 14:46    Procedures  Procedures (including critical care time)  Medications Ordered in ED Medications  ondansetron (ZOFRAN) injection 4 mg (4 mg Intravenous Given 09/01/18 1351)  morphine 4 MG/ML  injection 4 mg (4 mg Intravenous Given 09/01/18 1351)     Initial Impression / Assessment and Plan / ED Course  I have reviewed the triage vital signs and the nursing notes.  Pertinent labs & imaging results that were available during my care of the patient were reviewed by me and considered in my medical decision making (see chart for details).       [name] is a 33 y.o. female here with abdominal pain, vomiting.  Patient has history of renal transplant but now is on dialysis and last dialysis was today. Patient is very hypertensive in the ED.  Patient also has been vomiting and appears uncomfortable.  Patient is afebrile in the ED. Will get labs, CT ab/pel. Will give pain meds and antiemetics.   3:24 PM Patient briefly desat to upper 80s but CXR clear. Labs at baseline. Still vomiting in the ED. CT showed some hydro of the transplanted kidney but no stone. Patient doesn't want transfer to [facility] (where she had the transplant). States that she has been following up in [place] but wants to stay here. Will admit for intractable vomiting.     Final Clinical Impressions(s) / ED Diagnoses   Final diagnoses:  None    ED Discharge Orders    None       Drenda Freeze, MD 09/01/18 1526

## 2018-09-01 NOTE — Progress Notes (Signed)
Pharmacy Antibiotic Note  [name] is a 33 y.o. female admitted on 09/01/2018 with abdominal/flank pain. Pharmacy has been consulted for Zosyn dosing.  The patient is noted to have had a renal transplant - now failed and receiving HD.   Plan: - Start Zosyn 3.375g IV every 12 hours (infused over 4 hours) - Will continue to follow HD schedule/duration, culture results, LOT, and antibiotic de-escalation plans     Temp (24hrs), Avg:98.4 F (36.9 C), Min:98.4 F (36.9 C), Max:98.4 F (36.9 C)  Recent Labs  Lab 09/01/18 1324 09/01/18 1347  WBC 10.2  --   CREATININE 2.97* 2.80*    CrCl cannot be calculated (Unknown ideal weight.).    Allergies  Allergen Reactions  . Benadryl [Diphenhydramine-Zinc Acetate] Shortness Of Breath  . Motrin [Ibuprofen] Shortness Of Breath and Itching    Per pt  . Banana Other (See Comments)    Sick on the stomach  . Diphenhydramine Hcl     REACTION: Stopped breathing in ICU  . Doxycycline     Shortness of Breath   . Iron Dextran     REACTION: vein irritation  . Shellfish Allergy Hives    Antimicrobials this admission: Zosyn 6/19 >>  Dose adjustments this admission: n/a  Microbiology results: 6/19 BCx >> 6/19 COVID >>  Thank you for allowing pharmacy to be a part of this patient's care.  Alycia Rossetti, PharmD, BCPS Clinical Pharmacist Clinical phone for 09/01/2018: [phone] 09/01/2018 4:18 PM   **Pharmacist phone directory can now be found on [email or web address] ([email or web address]).  Listed under [place].

## 2018-09-01 NOTE — ED Notes (Signed)
CBG 322, Notified Percell Locus, Therapist, sports.
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
## 2018-09-01 NOTE — ED Notes (Signed)
No addl lab draw,  Pt off floor.

## 2018-09-02 ENCOUNTER — Other Ambulatory Visit: Payer: Self-pay

## 2018-09-02 DIAGNOSIS — Z992 Dependence on renal dialysis: Secondary | ICD-10-CM

## 2018-09-02 DIAGNOSIS — I161 Hypertensive emergency: Secondary | ICD-10-CM

## 2018-09-02 DIAGNOSIS — R112 Nausea with vomiting, unspecified: Secondary | ICD-10-CM

## 2018-09-02 DIAGNOSIS — D631 Anemia in chronic kidney disease: Secondary | ICD-10-CM

## 2018-09-02 DIAGNOSIS — N186 End stage renal disease: Secondary | ICD-10-CM

## 2018-09-02 LAB — GLUCOSE, CAPILLARY
Glucose-Capillary: 142 mg/dL — ABNORMAL HIGH (ref 70–99)
Glucose-Capillary: 260 mg/dL — ABNORMAL HIGH (ref 70–99)
Glucose-Capillary: 336 mg/dL — ABNORMAL HIGH (ref 70–99)

## 2018-09-02 LAB — CBC WITH DIFFERENTIAL/PLATELET
Abs Immature Granulocytes: 0.01 10*3/uL (ref 0.00–0.07)
Basophils Absolute: 0.1 10*3/uL (ref 0.0–0.1)
Basophils Relative: 1 %
Eosinophils Absolute: 0.2 10*3/uL (ref 0.0–0.5)
Eosinophils Relative: 3 %
HCT: 25.9 % — ABNORMAL LOW (ref 36.0–46.0)
Hemoglobin: 7.8 g/dL — ABNORMAL LOW (ref 12.0–15.0)
Immature Granulocytes: 0 %
Lymphocytes Relative: 25 %
Lymphs Abs: 1.8 10*3/uL (ref 0.7–4.0)
MCH: 26.4 pg (ref 26.0–34.0)
MCHC: 30.1 g/dL (ref 30.0–36.0)
MCV: 87.5 fL (ref 80.0–100.0)
Monocytes Absolute: 0.5 10*3/uL (ref 0.1–1.0)
Monocytes Relative: 7 %
Neutro Abs: 4.6 10*3/uL (ref 1.7–7.7)
Neutrophils Relative %: 64 %
Platelets: 234 10*3/uL (ref 150–400)
RBC: 2.96 MIL/uL — ABNORMAL LOW (ref 3.87–5.11)
RDW: 15.6 % — ABNORMAL HIGH (ref 11.5–15.5)
WBC: 7.1 10*3/uL (ref 4.0–10.5)
nRBC: 0 % (ref 0.0–0.2)

## 2018-09-02 LAB — CBC
HCT: 30.9 % — ABNORMAL LOW (ref 36.0–46.0)
Hemoglobin: 9.1 g/dL — ABNORMAL LOW (ref 12.0–15.0)
MCH: 26.2 pg (ref 26.0–34.0)
MCHC: 29.4 g/dL — ABNORMAL LOW (ref 30.0–36.0)
MCV: 89 fL (ref 80.0–100.0)
Platelets: 257 10*3/uL (ref 150–400)
RBC: 3.47 MIL/uL — ABNORMAL LOW (ref 3.87–5.11)
RDW: 15.7 % — ABNORMAL HIGH (ref 11.5–15.5)
WBC: 8 10*3/uL (ref 4.0–10.5)
nRBC: 0 % (ref 0.0–0.2)

## 2018-09-02 LAB — COMPREHENSIVE METABOLIC PANEL
ALT: 10 U/L (ref 0–44)
AST: 13 U/L — ABNORMAL LOW (ref 15–41)
Albumin: 2.6 g/dL — ABNORMAL LOW (ref 3.5–5.0)
Alkaline Phosphatase: 34 U/L — ABNORMAL LOW (ref 38–126)
Anion gap: 10 (ref 5–15)
BUN: 21 mg/dL — ABNORMAL HIGH (ref 6–20)
CO2: 28 mmol/L (ref 22–32)
Calcium: 8.7 mg/dL — ABNORMAL LOW (ref 8.9–10.3)
Chloride: 99 mmol/L (ref 98–111)
Creatinine, Ser: 4.61 mg/dL — ABNORMAL HIGH (ref 0.44–1.00)
GFR calc Af Amer: 13 mL/min — ABNORMAL LOW (ref >60)
GFR calc non Af Amer: 11 mL/min — ABNORMAL LOW (ref >60)
Glucose, Bld: 154 mg/dL — ABNORMAL HIGH (ref 70–99)
Potassium: 4.1 mmol/L (ref 3.5–5.1)
Sodium: 137 mmol/L (ref 135–145)
Total Bilirubin: 0.3 mg/dL (ref 0.3–1.2)
Total Protein: 6.5 g/dL (ref 6.5–8.1)

## 2018-09-02 LAB — MRSA PCR SCREENING: MRSA by PCR: NEGATIVE

## 2018-09-02 LAB — MAGNESIUM: Magnesium: 1.8 mg/dL (ref 1.7–2.4)

## 2018-09-02 MED ORDER — INSULIN GLARGINE 100 UNIT/ML ~~LOC~~ SOLN
20.0000 [IU] | Freq: Two times a day (BID) | SUBCUTANEOUS | Status: DC
  Administered 2018-09-02: 20 [IU] via SUBCUTANEOUS
  Filled 2018-09-02 (×3): qty 0.2

## 2018-09-02 MED ORDER — HEPARIN SODIUM (PORCINE) 1000 UNIT/ML IJ SOLN
INTRAMUSCULAR | Status: AC
  Filled 2018-09-02: qty 1

## 2018-09-02 MED ORDER — TACROLIMUS 1 MG PO CAPS
3.0000 mg | ORAL_CAPSULE | Freq: Two times a day (BID) | ORAL | Status: DC
  Administered 2018-09-02 – 2018-09-03 (×3): 3 mg via ORAL
  Filled 2018-09-02 (×3): qty 3

## 2018-09-02 MED ORDER — INSULIN ASPART 100 UNIT/ML ~~LOC~~ SOLN
5.0000 [IU] | Freq: Three times a day (TID) | SUBCUTANEOUS | Status: DC
  Administered 2018-09-02 (×2): 5 [IU] via SUBCUTANEOUS

## 2018-09-02 MED ORDER — MYCOPHENOLATE SODIUM 180 MG PO TBEC
720.0000 mg | DELAYED_RELEASE_TABLET | Freq: Two times a day (BID) | ORAL | Status: DC
  Administered 2018-09-02 – 2018-09-03 (×3): 720 mg via ORAL
  Filled 2018-09-02 (×5): qty 4

## 2018-09-02 NOTE — Procedures (Signed)
Patient seen and examined on Hemodialysis. BP (P) 104/64   Pulse (P) 93   Temp (P) 99.2 F (37.3 C) (Oral)   Resp (P) 15   Ht 5\' 6"  (1.676 m)   Wt 77.9 kg   SpO2 97%   BMI 27.72 kg/m   QB 350 mL/ min via TDC UF goal 3L Tolerating treatment without complaints at this time.  Probing for new EDW  Madelon Lips MD [place] pgr [phone] 8:39 AM

## 2018-09-02 NOTE — Progress Notes (Signed)
  [facility] Progress Note   Assessment/ Plan:    Dialyzes at [facility] MWF 4 hrs F180 EDW 78.5 BFR 350 2K/ 3.5 Ca bath Heparin 4000 u bolus 2000 u mid-run Mircera 150 mcg q 2 weeks   1 R flank pain/ n/v: considerations include pyelo, rejection.  Think less likely passed kidney stone as pain still present.  Blood cultures drawn, Zosyn started.  UA pending.  Pt is clinically improved. 2 ESRD: MWF, had full rx today.  Extra rx today 6/20 3 Hypertension: initially elevated, got IV labetalol, now placed back on home amlodipine, carvedilol, and hydralazine with improvement.  Expect pressures to improve after UF.   4. Anemia of ESRD: last Hgb 9.2, on mircera, last given 6/15.  Now 7.8 today, will trend 5. Metabolic Bone Disease: on added Ca bath.  Ca acetate as binder 6.  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  Will wait to adjust IS until etiology of flank pain is clearer.    Subjective:    Lying in bed, on HD.  Feeling better.     Objective:   BP (P) 104/64   Pulse (P) 93   Temp (P) 99.2 F (37.3 C) (Oral)   Resp (P) 15   Ht 5\' 6"  (1.676 m)   Wt 77.9 kg   SpO2 97%   BMI 27.72 kg/m   Physical Exam: GEN lying in bed, NAD HEENT EOMI PERRL NECK no JVD PULM breathing comfortably today CV tachycardic no m/r/g ABD soft, RLQ graft with tenderness, improved EXT trace LE edema NEURO AAO x 3 SKIN no rashes ACCESS: Retta Diones [facility]  Labs: BMET Recent Labs  Lab 09/01/18 1324 09/01/18 1347 09/02/18 0333  NA 135 136  137 137  K 3.0* 3.1*  3.1* 4.1  CL 96* 98 99  CO2 26  --  28  GLUCOSE 240* 243* 154*  BUN 11 13 21*  CREATININE 2.97* 2.80* 4.61*  CALCIUM 10.4*  --  8.7*   CBC Recent Labs  Lab 09/01/18 1324 09/01/18 1347 09/02/18 0333  WBC 10.2  --  7.1  NEUTROABS 7.9*  --  4.6  HGB 9.2* 10.5*  10.9* 7.8*  HCT 30.7* 31.0*  32.0* 25.9*  MCV 87.7  --  87.5  PLT 316  --  234     @IMGRELPRIORS @ Medications:    . amLODipine  10 mg Oral Daily  . carvedilol  12.5 mg Oral BID WC  . Chlorhexidine Gluconate Cloth  6 each Topical Q0600  . dicyclomine  20 mg Oral BID  . heparin      . heparin  4,000 Units Dialysis Once in dialysis  . heparin injection (subcutaneous)  5,000 Units Subcutaneous Q8H  . hydrALAZINE  50 mg Oral Q8H  . insulin aspart  0-5 Units Subcutaneous QHS  . insulin aspart  0-9 Units Subcutaneous TID WC  . insulin glargine  7 Units Subcutaneous Daily  . metoCLOPramide  5 mg Oral BID  . pantoprazole  40 mg Oral BID  . predniSONE  10 mg Oral q morning - 10a  . senna-docusate  2 tablet Oral QHS  . simvastatin  20 mg Oral QHS     Madelon Lips, MD [facility] pgr [phone] 09/02/2018, 8:23 AM

## 2018-09-02 NOTE — Progress Notes (Signed)
Patient ID: [name], female   DOB: 27-Aug-1985, 33 y.o.   MRN: [id number]  PROGRESS NOTE    TRUC WINFREE  QMV:[id number] DOB: 12/29/1985 DOA: 09/01/2018 PCP: [name], MD   Brief Narrative:  33 year old female with history of end-stage renal disease on dialysis, status post renal transplant at age of 33 on Prograf and chronic steroids, insulin-dependent diabetes mellitus, gastroparesis, anemia of chronic disease, hypertension presented on 09/01/2018 with nausea, vomiting and right flank pain.  In the ED, she was found to have elevated blood pressure of 212/135.  CT renal stone study showed right lower quadrant renal transplant graft was edematous with adjacent fat stranding and moderate hydronephrosis.  She was  started on IV Zosyn.  Nephrology was consulted.  Assessment & Plan:   Active Problems:   Hypertensive emergency  Hypertensive emergency with flash pulmonary edema -Presented with blood pressure of 212/135 and chest x-ray showing cardiomegaly with increased pulmonary vascularity -Nephrology following.  Patient undergoing dialysis today.  Blood pressure much improved -COVID-19 testing negative on presentation  Right flank pain with nausea and vomiting with concern for acute pyelonephritis versus rejection of the transplanted kidney History of right renal transplant -Currently empirically on Zosyn.  Follow cultures.  Nephrology following. - CT renal stone study showed right lower quadrant renal transplant graft was edematous with adjacent fat stranding and moderate hydronephrosis.   -He is currently on mycophenolate, tacrolimus and prednisone.  Anemia of chronic disease -Due to  renal disease.  Hemoglobin stable.  Transfuse if hemoglobin is less than 7  Diabetes mellitus type II with hyperglycemia -Still hyperglycemic.  Will increase Lantus and continue with sliding scale coverage. -Hemoglobin A1c 10.1.  DVT prophylaxis: Heparin subcutaneous Code Status: Full Family  Communication: None at bedside Disposition Plan: Home once cleared by nephrology in the next 1 to 2 days  Consultants: Nephrology  Procedures: None  Antimicrobials: Zosyn from 09/01/2018 onwards   Subjective: Patient seen and examined at bedside undergoing dialysis.  She feels better.  No overnight fever or vomiting.  Wants to eat solid food.  Objective: Vitals:   09/02/18 0900 09/02/18 0930 09/02/18 1000 09/02/18 1030  BP: 124/82 108/76 116/79 104/70  Pulse: 92 94 96 98  Resp:      Temp:      TempSrc:      SpO2:      Weight:      Height:        Intake/Output Summary (Last 24 hours) at 09/02/2018 1052 Last data filed at 09/02/2018 0422 Gross per 24 hour  Intake 752.19 ml  Output --  Net 752.19 ml   Filed Weights   09/01/18 1900 09/01/18 2003 09/02/18 0709  Weight: 83.5 kg 77.9 kg 78.6 kg    Examination:  General exam: Appears calm and comfortable; no distress Respiratory system: Bilateral decreased breath sounds at bases Cardiovascular system: S1 & S2 heard, Rate controlled Gastrointestinal system: Abdomen is nondistended, soft and mildly right lower quadrant tenderness present. Normal bowel sounds heard. Extremities: No cyanosis, clubbing; trace lower extremity edema   Data Reviewed: I have personally reviewed following labs and imaging studies  CBC: Recent Labs  Lab 09/01/18 1324 09/01/18 1347 09/02/18 0333  WBC 10.2  --  7.1  NEUTROABS 7.9*  --  4.6  HGB 9.2* 10.5*   10.9* 7.8*  HCT 30.7* 31.0*   32.0* 25.9*  MCV 87.7  --  87.5  PLT 316  --  284   Basic Metabolic Panel: Recent Labs  Lab  09/01/18 1324 09/01/18 1347 09/02/18 0333  NA 135 136   137 137  K 3.0* 3.1*   3.1* 4.1  CL 96* 98 99  CO2 26  --  28  GLUCOSE 240* 243* 154*  BUN 11 13 21*  CREATININE 2.97* 2.80* 4.61*  CALCIUM 10.4*  --  8.7*  MG  --   --  1.8   GFR: Estimated Creatinine Clearance: 18 mL/min (A) (by C-G formula based on SCr of 4.61 mg/dL (H)). Liver Function  Tests: Recent Labs  Lab 09/01/18 1324 09/02/18 0333  AST 16 13*  ALT 11 10  ALKPHOS 42 34*  BILITOT 0.7 0.3  PROT 8.2* 6.5  ALBUMIN 3.4* 2.6*   Recent Labs  Lab 09/01/18 1324  LIPASE 70*   No results for input(s): AMMONIA in the last 168 hours. Coagulation Profile: No results for input(s): INR, PROTIME in the last 168 hours. Cardiac Enzymes: No results for input(s): CKTOTAL, CKMB, CKMBINDEX, TROPONINI in the last 168 hours. BNP (last 3 results) No results for input(s): PROBNP in the last 8760 hours. HbA1C: Recent Labs    09/01/18 1324  HGBA1C 10.1*   CBG: Recent Labs  Lab 09/01/18 1754 09/01/18 2056  GLUCAP 322* 327*   Lipid Profile: No results for input(s): CHOL, HDL, LDLCALC, TRIG, CHOLHDL, LDLDIRECT in the last 72 hours. Thyroid Function Tests: No results for input(s): TSH, T4TOTAL, FREET4, T3FREE, THYROIDAB in the last 72 hours. Anemia Panel: No results for input(s): VITAMINB12, FOLATE, FERRITIN, TIBC, IRON, RETICCTPCT in the last 72 hours. Sepsis Labs: Recent Labs  Lab 09/01/18 1743 09/01/18 2110  LATICACIDVEN 1.1 1.1    Recent Results (from the past 240 hour(s))  SARS Coronavirus 2     Status: None   Collection Time: 09/01/18  5:32 PM  Result Value Ref Range Status   SARS Coronavirus 2 NOT DETECTED NOT DETECTED Final    Comment: (NOTE) SARS-CoV-2 target nucleic acids are NOT DETECTED. The SARS-CoV-2 RNA is generally detectable in upper and lower respiratory specimens during the acute phase of infection.  Negative  results do not preclude SARS-CoV-2 infection, do not rule out co-infections with other pathogens, and should not be used as the sole basis for treatment or other patient management decisions.  Negative results must be combined with clinical observations, patient history, and epidemiological information. The expected result is Not Detected. Fact Sheet for  Patients: http://www.biofiredefense.com/wp-content/uploads/2020/03/BIOFIRE-COVID -19-patients.pdf Fact Sheet for Healthcare Providers: http://www.biofiredefense.com/wp-content/uploads/2020/03/BIOFIRE-COVID -19-hcp.pdf This test is not yet approved or cleared by the [place] and  has been authorized for detection and/or diagnosis of SARS-CoV-2 by FDA under an Emergency Use Authorization (EUA).  This EUA will remain in effec t (meaning this test can be used) for the duration of  the COVID-19 declaration under Section 564(b)(1) of the Act, 21 U.S.C. section 360bbb-3(b)(1), unless the authorization is terminated or revoked sooner. Performed at [facility], [place] [name], [place], [place] [address]   MRSA PCR Screening     Status: None   Collection Time: 09/02/18 12:05 AM   Specimen: Nasal Mucosa; Nasopharyngeal  Result Value Ref Range Status   MRSA by PCR NEGATIVE NEGATIVE Final    Comment:        The GeneXpert MRSA Assay (FDA approved for NASAL specimens only), is one component of a comprehensive MRSA colonization surveillance program. It is not intended to diagnose MRSA infection nor to guide or monitor treatment for MRSA infections. Performed at [facility], [place] [address],  [place], [place] [address]          Radiology Studies: Dg Chest Port 1 View  Result Date: 09/01/2018 CLINICAL DATA:  Back and right flank pain. EXAM: PORTABLE CHEST 1 VIEW COMPARISON:  05/01/2018 FINDINGS: Large bore central venous catheter in stable position. Cardiomediastinal silhouette is stably enlarged. Mediastinal contours appear intact. Low lung volumes. Bibasilar streaky airspace opacities likely represent atelectasis. Osseous structures are without acute abnormality. Soft tissues are grossly normal. IMPRESSION: 1. Low lung volumes with bibasilar atelectasis. 2. Stably enlarged cardiac silhouette. Electronically Signed   By: Fidela Salisbury M.D.   On: 09/01/2018 15:00    Ct Renal Stone Study  Result Date: 09/01/2018 CLINICAL DATA:  Back and flank pain, vomiting, history of renal transplant, rejection and dialysis EXAM: CT ABDOMEN AND PELVIS WITHOUT CONTRAST TECHNIQUE: Multidetector CT imaging of the abdomen and pelvis was performed following the standard protocol without IV contrast. COMPARISON:  11/11/2017 FINDINGS: Lower chest: Bibasilar bandlike scarring or atelectasis. Hepatobiliary: No focal liver abnormality is seen. Status post cholecystectomy. No biliary dilatation. Pancreas: Unremarkable. No pancreatic ductal dilatation or surrounding inflammatory changes. Spleen: Normal in size without significant abnormality. Adrenals/Urinary Tract: Adrenal glands are unremarkable. Very atrophic native left kidney with absent native right kidney. The right lower quadrant renal transplant graft is edematous with moderate hydronephrosis. There is no obstructing calculus or other lesion appreciated of the transplant graft ureter, however visualization is limited due to configuration. There is an unchanged subcapsular seroma or cyst about the midportion of graft. Bladder is unremarkable. Stomach/Bowel: Stomach is within normal limits. Appendix appears normal. No evidence of bowel wall thickening, distention, or inflammatory changes. Vascular/Lymphatic: No significant vascular findings are present. No enlarged abdominal or pelvic lymph nodes. Reproductive: No mass or other significant abnormality. Other: No abdominal wall hernia or abnormality. No abdominopelvic ascites. Musculoskeletal: No acute or significant osseous findings. IMPRESSION: 1. The right lower quadrant renal transplant graft is edematous with adjacent fat stranding and moderate hydronephrosis. There is no obstructing calculus or other lesion appreciated of the transplant graft ureter, however visualization is limited due to configuration. Lasix nuclear renogram may be used evaluate for functional transplant ureter  stricture if desired. 2. Other chronic, incidental, and postoperative findings as detailed above. Electronically Signed   By: Eddie Candle M.D.   On: 09/01/2018 14:46        Scheduled Meds:  amLODipine  10 mg Oral Daily   carvedilol  12.5 mg Oral BID WC   Chlorhexidine Gluconate Cloth  6 each Topical Q0600   dicyclomine  20 mg Oral BID   heparin       heparin  4,000 Units Dialysis Once in dialysis   heparin injection (subcutaneous)  5,000 Units Subcutaneous Q8H   hydrALAZINE  50 mg Oral Q8H   insulin aspart  0-5 Units Subcutaneous QHS   insulin aspart  0-9 Units Subcutaneous TID WC   insulin glargine  7 Units Subcutaneous Daily   metoCLOPramide  5 mg Oral BID   mycophenolate  720 mg Oral BID   pantoprazole  40 mg Oral BID   predniSONE  10 mg Oral q morning - 10a   senna-docusate  2 tablet Oral QHS   simvastatin  20 mg Oral QHS   tacrolimus  3 mg Oral BID   Continuous Infusions:  sodium chloride     sodium chloride     piperacillin-tazobactam (ZOSYN)  IV 3.375 g (09/02/18 0516)     LOS: 1 day  [name], MD [facility] 09/02/2018, 10:52 AM

## 2018-09-03 DIAGNOSIS — R109 Unspecified abdominal pain: Secondary | ICD-10-CM

## 2018-09-03 LAB — BASIC METABOLIC PANEL WITH GFR
Anion gap: 11 (ref 5–15)
BUN: 25 mg/dL — ABNORMAL HIGH (ref 6–20)
CO2: 23 mmol/L (ref 22–32)
Calcium: 8.8 mg/dL — ABNORMAL LOW (ref 8.9–10.3)
Chloride: 96 mmol/L — ABNORMAL LOW (ref 98–111)
Creatinine, Ser: 4.47 mg/dL — ABNORMAL HIGH (ref 0.44–1.00)
GFR calc Af Amer: 14 mL/min — ABNORMAL LOW
GFR calc non Af Amer: 12 mL/min — ABNORMAL LOW
Glucose, Bld: 431 mg/dL — ABNORMAL HIGH (ref 70–99)
Potassium: 4.7 mmol/L (ref 3.5–5.1)
Sodium: 130 mmol/L — ABNORMAL LOW (ref 135–145)

## 2018-09-03 LAB — CBC WITH DIFFERENTIAL/PLATELET
Abs Immature Granulocytes: 0.03 10*3/uL (ref 0.00–0.07)
Basophils Absolute: 0 10*3/uL (ref 0.0–0.1)
Basophils Relative: 1 %
Eosinophils Absolute: 0.2 10*3/uL (ref 0.0–0.5)
Eosinophils Relative: 3 %
HCT: 27.7 % — ABNORMAL LOW (ref 36.0–46.0)
Hemoglobin: 8.1 g/dL — ABNORMAL LOW (ref 12.0–15.0)
Immature Granulocytes: 0 %
Lymphocytes Relative: 24 %
Lymphs Abs: 1.8 10*3/uL (ref 0.7–4.0)
MCH: 26 pg (ref 26.0–34.0)
MCHC: 29.2 g/dL — ABNORMAL LOW (ref 30.0–36.0)
MCV: 89.1 fL (ref 80.0–100.0)
Monocytes Absolute: 0.6 10*3/uL (ref 0.1–1.0)
Monocytes Relative: 8 %
Neutro Abs: 4.7 10*3/uL (ref 1.7–7.7)
Neutrophils Relative %: 64 %
Platelets: 275 10*3/uL (ref 150–400)
RBC: 3.11 MIL/uL — ABNORMAL LOW (ref 3.87–5.11)
RDW: 15.4 % (ref 11.5–15.5)
WBC: 7.3 10*3/uL (ref 4.0–10.5)
nRBC: 0 % (ref 0.0–0.2)

## 2018-09-03 LAB — GLUCOSE, CAPILLARY
Glucose-Capillary: 400 mg/dL — ABNORMAL HIGH (ref 70–99)
Glucose-Capillary: 135 mg/dL — ABNORMAL HIGH (ref 70–99)

## 2018-09-03 LAB — URINALYSIS, MICROSCOPIC (REFLEX)
Bacteria, UA: NONE SEEN
RBC / HPF: >50 RBC/hpf (ref 0–5)

## 2018-09-03 LAB — URINALYSIS, ROUTINE W REFLEX MICROSCOPIC
Bilirubin Urine: NEGATIVE
Glucose, UA: 250 mg/dL — AB
Ketones, ur: NEGATIVE mg/dL
Nitrite: POSITIVE — AB
Protein, ur: >300 mg/dL — AB
Specific Gravity, Urine: 1.015 (ref 1.005–1.030)
pH: 8 (ref 5.0–8.0)

## 2018-09-03 LAB — MAGNESIUM: Magnesium: 2 mg/dL (ref 1.7–2.4)

## 2018-09-03 MED ORDER — INSULIN ASPART 100 UNIT/ML ~~LOC~~ SOLN
8.0000 [IU] | Freq: Three times a day (TID) | SUBCUTANEOUS | Status: DC
  Administered 2018-09-03 (×2): 8 [IU] via SUBCUTANEOUS

## 2018-09-03 MED ORDER — FLUTICASONE PROPIONATE 50 MCG/ACT NA SUSP: 2.0000 | Freq: Every day | NASAL | Status: DC | PRN

## 2018-09-03 MED ORDER — TRAMADOL HCL 50 MG PO TABS: 50.0000 mg | ORAL_TABLET | Freq: Two times a day (BID) | ORAL | 0 refills | Status: DC | PRN

## 2018-09-03 MED ORDER — CEPHALEXIN 500 MG PO CAPS: 500.0000 mg | ORAL_CAPSULE | Freq: Every day | ORAL | 0 refills | Status: AC

## 2018-09-03 MED ORDER — INSULIN GLARGINE 100 UNIT/ML ~~LOC~~ SOLN
30.0000 [IU] | Freq: Two times a day (BID) | SUBCUTANEOUS | Status: DC
  Administered 2018-09-03: 30 [IU] via SUBCUTANEOUS
  Filled 2018-09-03 (×2): qty 0.3

## 2018-09-03 NOTE — Progress Notes (Signed)
[facility] Progress Note   Assessment/ Plan:    Dialyzes at [facility] MWF 4 hrs F180 EDW 78.5 BFR 350 2K/ 3.5 Ca bath Heparin 4000 u bolus 2000 u mid-run Mircera 150 mcg q 2 weeks   1 R flank pain/ n/v: considerations include pyelo, rejection--> pt gives a history of gross hematuria for the past 2 weeks which can be seen in rejection.  Think less likely passed kidney stone.  Blood cultures drawn and negative, on Zosyn.  UA pending-- discussed with pt importance of collecting the UA (look for UTI/ Pyelo/ rejection) before discharge.  Pt is clinically improved. 2 ESRD: MWF, had full rx today.  s/p extra rx 6/20  Will need lower EDW to 75.5.  3 Hypertension: initially elevated, got IV labetalol, now placed back on home amlodipine, carvedilol, and hydralazine with improvement.   4. Anemia of ESRD: last Hgb 9.2, on mircera, last given 6/15.  Now 7.8--> 9.1 5. Metabolic Bone Disease: on added Ca bath.  Ca acetate as binder 6.  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  Will wait to adjust IS until etiology of flank pain is clearer.   7.  Dispo: OK from renal perspective to d/c today.  Would send out on PO antibiotics for a total 10 day course.  I have advised to also follow up with her transplant team at [facility] this week--> if she is having rejection episodes with clinical symptoms then would need to be considered for embolization of kidney and removal.     Subjective:    S/p HD yesterday and feeling better.  BP much better. Flank pain improved.  UA not collected yet.     Objective:   BP (!) 132/91 (BP Location: Left Arm)   Pulse 88   Temp 98.1 F (36.7 C) (Oral)   Resp 16   Ht 5\' 6"  (1.676 m)   Wt 75.3 kg Comment: stood to scale   SpO2 100%   BMI 26.79 kg/m   Physical Exam: GEN sitting up in bed, eating breakfast, smiling HEENT EOMI PERRL NECK no JVD PULM clear bilaterally no c/w/r CV RRR no m/r/g ABD soft, RLQ  graft with tenderness, improved EXT trace LE edema NEURO AAO x 3 SKIN no rashes [name]  Labs: BMET Recent Labs  Lab 09/01/18 1324 09/01/18 1347 09/02/18 0333 09/03/18 0232  NA 135 136  137 137 130*  K 3.0* 3.1*  3.1* 4.1 4.7  CL 96* 98 99 96*  CO2 26  --  28 23  GLUCOSE 240* 243* 154* 431*  BUN 11 13 21* 25*  CREATININE 2.97* 2.80* 4.61* 4.47*  CALCIUM 10.4*  --  8.7* 8.8*   CBC Recent Labs  Lab 09/01/18 1324 09/01/18 1347 09/02/18 0333 09/02/18 1258 09/03/18 0232  WBC 10.2  --  7.1 8.0 7.3  NEUTROABS 7.9*  --  4.6  --  4.7  HGB 9.2* 10.5*  10.9* 7.8* 9.1* 8.1*  HCT 30.7* 31.0*  32.0* 25.9* 30.9* 27.7*  MCV 87.7  --  87.5 89.0 89.1  PLT 316  --  234 257 275    @IMGRELPRIORS @ Medications:    . amLODipine  10 mg Oral Daily  . carvedilol  12.5 mg Oral BID WC  . Chlorhexidine Gluconate Cloth  6 each Topical Q0600  . dicyclomine  20 mg Oral BID  . heparin  4,000 Units Dialysis Once  in dialysis  . heparin injection (subcutaneous)  5,000 Units Subcutaneous Q8H  . hydrALAZINE  50 mg Oral Q8H  . insulin aspart  0-5 Units Subcutaneous QHS  . insulin aspart  0-9 Units Subcutaneous TID WC  . insulin aspart  8 Units Subcutaneous TID WC  . insulin glargine  30 Units Subcutaneous BID  . metoCLOPramide  5 mg Oral BID  . mycophenolate  720 mg Oral BID  . pantoprazole  40 mg Oral BID  . predniSONE  10 mg Oral q morning - 10a  . senna-docusate  2 tablet Oral QHS  . simvastatin  20 mg Oral QHS  . tacrolimus  3 mg Oral BID     Madelon Lips, MD [facility] pgr [phone] 09/03/2018, 10:31 AM

## 2018-09-03 NOTE — Discharge Summary (Signed)
Physician Discharge Summary  CARESS REFFITT NTZ:[id number] DOB: 07-16-1985 DOA: 09/01/2018  PCP: [name], MD  Admit date: 09/01/2018 Discharge date: 09/03/2018  Admitted From: Home Disposition: Home  Recommendations for Outpatient Follow-up:  1. Follow up with PCP in 1 week with repeat CBC/BMP 2. Outpatient follow-up with nephrology and transplant team at earliest convenience.  Also outpatient follow-up with dialysis scheduled. 3. Follow up in ED if symptoms worsen or new appear   Home Health: No Equipment/Devices: None  Discharge Condition: Stable CODE STATUS: Full Diet recommendation: Heart healthy/carb modified/renal hemodialysis diet  Brief/Interim Summary: 33 year old female with history of end-stage renal disease on dialysis, status post renal transplant at age of 44 on Prograf and chronic steroids, insulin-dependent diabetes mellitus, gastroparesis, anemia of chronic disease, hypertension presented on 09/01/2018 with nausea, vomiting and right flank pain.  In the ED, she was found to have elevated blood pressure of 212/135.  CT renal stone study showed right lower quadrant renal transplant graft was edematous with adjacent fat stranding and moderate hydronephrosis.  She was  started on IV Zosyn.  Nephrology was consulted.  During the hospitalization, her condition improved.  She is tolerating diet and not vomiting anymore.  Her abdominal pain is improving.  Although we do not have a UA on her, she wants to go home and nephrology has cleared the patient for discharge on oral antibiotics for 10 more days.  Discharge Diagnoses:  Active Problems:   Hypertensive emergency  Hypertensive emergency with flash pulmonary edema -Presented with blood pressure of 212/135 and chest x-ray showing cardiomegaly with increased pulmonary vascularity -Nephrology following.  Patient underwent dialysis on 09/02/2018.  Blood pressure much improved -COVID-19 testing negative on  presentation -Nephrology has cleared the patient for discharge.  Outpatient follow-up with dialysis as scheduled.  Right flank pain with nausea and vomiting with concern for acute pyelonephritis versus rejection of the transplanted kidney History of right renal transplant -Currently empirically on Zosyn.  Nephrology following. -She is tolerating diet and not vomiting anymore.  Her abdominal pain is improving.  Although we do not have a UA on her, she wants to go home and nephrology has cleared the patient for discharge on oral antibiotics for 10 more days.  We will discharge her home on Keflex 500 mg every evening for 10 days.  Dr. Hollie Salk is okay with this.  Outpatient follow-up with transplant team at earliest convenience. - CT renal stone study showed right lower quadrant renal transplant graft was edematous with adjacent fat stranding and moderate hydronephrosis.   -She is currently on mycophenolate, tacrolimus and prednisone.  Anemia of chronic disease -Due to  renal disease.  Hemoglobin stable.  Outpatient follow-up  Diabetes mellitus type II with hyperglycemia -Still hyperglycemic.   -Hemoglobin A1c 10.1. -Continue home regimen.  Outpatient follow-up.  Discharge Instructions  Discharge Instructions    Diet - low sodium heart healthy   Complete by: As directed    Diet Carb Modified   Complete by: As directed    Increase activity slowly   Complete by: As directed      Allergies as of 09/03/2018      Reactions   Benadryl [diphenhydramine-zinc Acetate] Shortness Of Breath   Motrin [ibuprofen] Shortness Of Breath, Itching   Per pt   Banana Other (See Comments)   Sick on the stomach   Diphenhydramine Hcl    REACTION: Stopped breathing in ICU   Doxycycline    Shortness of Breath    Iron Dextran  REACTION: vein irritation   Shellfish Allergy Hives      Medication List    STOP taking these medications   amoxicillin-clavulanate 500-125 MG tablet Commonly known as:  AUGMENTIN   sodium bicarbonate 650 MG tablet   torsemide 20 MG tablet Commonly known as: DEMADEX     TAKE these medications   Accu-Chek Softclix Lancets lancets Use to check blood sugar 4 times per day.   acetaminophen 500 MG tablet Commonly known as: TYLENOL Take 1,000 mg by mouth daily as needed for moderate pain.   amLODipine 10 MG tablet Commonly known as: NORVASC Take 1 tablet (10 mg total) by mouth daily.   b complex-vitamin c-folic acid 0.8 MG Tabs tablet Take 1 tablet by mouth daily. After dialysis on dialysis days   calcitRIOL 0.25 MCG capsule Commonly known as: ROCALTROL Take 0.25 mcg by mouth daily.   CALCIUM 1000 + D PO Take 1 tablet by mouth daily.   calcium acetate 667 MG capsule Commonly known as: PHOSLO Take 1,334 mg by mouth 3 (three) times daily with meals.   calcium elemental as carbonate 400 MG chewable tablet Commonly known as: BARIATRIC TUMS ULTRA Chew 1,000 mg by mouth 3 (three) times daily as needed for heartburn.   carvedilol 12.5 MG tablet Commonly known as: COREG Take 12.5 mg by mouth 2 (two) times daily with a meal.   cephALEXin 500 MG capsule Commonly known as: KEFLEX Take 1 capsule (500 mg total) by mouth daily at 6 PM for 10 days.   dicyclomine 20 MG tablet Commonly known as: BENTYL Take 1 tablet (20 mg total) by mouth 2 (two) times daily.   famotidine 10 MG tablet Commonly known as: PEPCID Take 10 mg by mouth daily.   fluticasone 50 MCG/ACT nasal spray Commonly known as: FLONASE Place 2 sprays into both nostrils daily as needed for allergies.   gabapentin 300 MG capsule Commonly known as: NEURONTIN Take 300 mg by mouth 3 (three) times daily.   hydrALAZINE 50 MG tablet Commonly known as: APRESOLINE Take 1 tablet (50 mg total) by mouth every 8 (eight) hours.   hydrOXYzine 50 MG tablet Commonly known as: ATARAX/VISTARIL Take 1 tablet (50 mg total) by mouth 2 (two) times daily as needed for itching.   insulin degludec  100 UNIT/ML Sopn FlexTouch Pen Commonly known as: TRESIBA Inject 0.6 mLs (60 Units total) into the skin 2 (two) times daily. Reports taking 24 units QAM   loperamide 2 MG capsule Commonly known as: IMODIUM Take 2 mg by mouth as needed for diarrhea or loose stools.   metoCLOPramide 10 MG tablet Commonly known as: REGLAN Take 1 tablet (10 mg total) by mouth 3 (three) times daily before meals.   mycophenolate 360 MG Tbec EC tablet Commonly known as: MYFORTIC Take 720 mg by mouth 2 (two) times daily.   NovoLOG FlexPen 100 UNIT/ML FlexPen Generic drug: insulin aspart Inject 12-15 Units into the skin See admin instructions. 5-6 times per day as needed   ondansetron 4 MG disintegrating tablet Commonly known as: Zofran ODT Take 1 tablet (4 mg total) by mouth every 4 (four) hours as needed for nausea or vomiting.   pantoprazole 40 MG tablet Commonly known as: PROTONIX Take 40 mg by mouth 2 (two) times daily.   predniSONE 10 MG tablet Commonly known as: DELTASONE Take 1 tablet (10 mg total) by mouth every morning.   promethazine 25 MG tablet Commonly known as: PHENERGAN Take 1 tablet (25 mg total) by mouth every  6 (six) hours as needed for nausea or vomiting.   simvastatin 20 MG tablet Commonly known as: ZOCOR Take 20 mg by mouth at bedtime.   tacrolimus 1 MG capsule Commonly known as: PROGRAF Take 3 mg by mouth 2 (two) times daily.   traMADol 50 MG tablet Commonly known as: Ultram Take 1 tablet (50 mg total) by mouth every 12 (twelve) hours as needed for moderate pain.       Allergies  Allergen Reactions  . Benadryl [Diphenhydramine-Zinc Acetate] Shortness Of Breath  . Motrin [Ibuprofen] Shortness Of Breath and Itching    Per pt  . Banana Other (See Comments)    Sick on the stomach  . Diphenhydramine Hcl     REACTION: Stopped breathing in ICU  . Doxycycline     Shortness of Breath   . Iron Dextran     REACTION: vein irritation  . Shellfish Allergy Hives     Consultations: Nephrology   Procedures/Studies: Dg Chest Port 1 View  Result Date: 09/01/2018 CLINICAL DATA:  Back and right flank pain. EXAM: PORTABLE CHEST 1 VIEW COMPARISON:  05/01/2018 FINDINGS: Large bore central venous catheter in stable position. Cardiomediastinal silhouette is stably enlarged. Mediastinal contours appear intact. Low lung volumes. Bibasilar streaky airspace opacities likely represent atelectasis. Osseous structures are without acute abnormality. Soft tissues are grossly normal. IMPRESSION: 1. Low lung volumes with bibasilar atelectasis. 2. Stably enlarged cardiac silhouette. Electronically Signed   By: Fidela Salisbury M.D.   On: 09/01/2018 15:00   Ct Renal Stone Study  Result Date: 09/01/2018 CLINICAL DATA:  Back and flank pain, vomiting, history of renal transplant, rejection and dialysis EXAM: CT ABDOMEN AND PELVIS WITHOUT CONTRAST TECHNIQUE: Multidetector CT imaging of the abdomen and pelvis was performed following the standard protocol without IV contrast. COMPARISON:  11/11/2017 FINDINGS: Lower chest: Bibasilar bandlike scarring or atelectasis. Hepatobiliary: No focal liver abnormality is seen. Status post cholecystectomy. No biliary dilatation. Pancreas: Unremarkable. No pancreatic ductal dilatation or surrounding inflammatory changes. Spleen: Normal in size without significant abnormality. Adrenals/Urinary Tract: Adrenal glands are unremarkable. Very atrophic native left kidney with absent native right kidney. The right lower quadrant renal transplant graft is edematous with moderate hydronephrosis. There is no obstructing calculus or other lesion appreciated of the transplant graft ureter, however visualization is limited due to configuration. There is an unchanged subcapsular seroma or cyst about the midportion of graft. Bladder is unremarkable. Stomach/Bowel: Stomach is within normal limits. Appendix appears normal. No evidence of bowel wall thickening,  distention, or inflammatory changes. Vascular/Lymphatic: No significant vascular findings are present. No enlarged abdominal or pelvic lymph nodes. Reproductive: No mass or other significant abnormality. Other: No abdominal wall hernia or abnormality. No abdominopelvic ascites. Musculoskeletal: No acute or significant osseous findings. IMPRESSION: 1. The right lower quadrant renal transplant graft is edematous with adjacent fat stranding and moderate hydronephrosis. There is no obstructing calculus or other lesion appreciated of the transplant graft ureter, however visualization is limited due to configuration. Lasix nuclear renogram may be used evaluate for functional transplant ureter stricture if desired. 2. Other chronic, incidental, and postoperative findings as detailed above. Electronically Signed   By: Eddie Candle M.D.   On: 09/01/2018 14:46       Subjective: Patient seen and examined at bedside.  She feels better although still has some right flank pain.  She is tolerating diet and wants to go home.  Discharge Exam: Vitals:   09/03/18 0407 09/03/18 0757  BP: 126/83 (!) 132/91  Pulse: 80  88  Resp: 16 16  Temp: 98.1 F (36.7 C) 98.1 F (36.7 C)  SpO2: 100% 100%    General: Pt is alert, awake, not in acute distress Cardiovascular: rate controlled, S1/S2 + Respiratory: bilateral decreased breath sounds at bases with some scattered crackles Abdominal: Soft, mild right flank tenderness, ND, bowel sounds + Extremities: Trace edema, no cyanosis    The results of significant diagnostics from this hospitalization (including imaging, microbiology, ancillary and laboratory) are listed below for reference.     Microbiology: Recent Results (from the past 240 hour(s))  SARS Coronavirus 2     Status: None   Collection Time: 09/01/18  5:32 PM  Result Value Ref Range Status   SARS Coronavirus 2 NOT DETECTED NOT DETECTED Final    Comment: (NOTE) SARS-CoV-2 target nucleic acids are NOT  DETECTED. The SARS-CoV-2 RNA is generally detectable in upper and lower respiratory specimens during the acute phase of infection.  Negative  results do not preclude SARS-CoV-2 infection, do not rule out co-infections with other pathogens, and should not be used as the sole basis for treatment or other patient management decisions.  Negative results must be combined with clinical observations, patient history, and epidemiological information. The expected result is Not Detected. Fact Sheet for Patients: http://www.biofiredefense.com/wp-content/uploads/2020/03/BIOFIRE-COVID -19-patients.pdf Fact Sheet for Healthcare Providers: http://www.biofiredefense.com/wp-content/uploads/2020/03/BIOFIRE-COVID -19-hcp.pdf This test is not yet approved or cleared by the [place] and  has been authorized for detection and/or diagnosis of SARS-CoV-2 by FDA under an Emergency Use Authorization (EUA).  This EUA will remain in effec t (meaning this test can be used) for the duration of  the COVID-19 declaration under Section 564(b)(1) of the Act, 21 U.S.C. section 360bbb-3(b)(1), unless the authorization is terminated or revoked sooner. Performed at [facility], [place] [address], [place], [place] [address]   Culture, blood (routine x 2)     Status: None (Preliminary result)   Collection Time: 09/01/18  5:43 PM   Specimen: BLOOD RIGHT HAND  Result Value Ref Range Status   Specimen Description BLOOD RIGHT HAND  Final   Special Requests   Final    BOTTLES DRAWN AEROBIC ONLY Blood Culture adequate volume   Culture   Final    NO GROWTH < 24 HOURS Performed at [facility], [place] [address], [place], [place] [address]    Report Status PENDING  Incomplete  Culture, blood (routine x 2)     Status: None (Preliminary result)   Collection Time: 09/01/18  5:55 PM   Specimen: BLOOD LEFT HAND  Result Value Ref Range Status   Specimen Description BLOOD LEFT HAND  Final   Special Requests    Final    BOTTLES DRAWN AEROBIC AND ANAEROBIC Blood Culture results may not be optimal due to an inadequate volume of blood received in culture bottles   Culture   Final    NO GROWTH < 24 HOURS Performed at [facility], [place] [address], [place], [place] [address]    Report Status PENDING  Incomplete  MRSA PCR Screening     Status: None   Collection Time: 09/02/18 12:05 AM   Specimen: Nasal Mucosa; Nasopharyngeal  Result Value Ref Range Status   MRSA by PCR NEGATIVE NEGATIVE Final    Comment:        The GeneXpert MRSA Assay (FDA approved for NASAL specimens only), is one component of a comprehensive MRSA colonization surveillance program. It is not intended to diagnose MRSA infection nor to guide  or monitor treatment for MRSA infections. Performed at [facility], [place] [address] [place], [place] [address]      Labs: BNP (last 3 results) Recent Labs    03/01/18 1100 05/01/18 1013 09/01/18 1324  BNP 63.3 433.7* 175.1*   Basic Metabolic Panel: Recent Labs  Lab 09/01/18 1324 09/01/18 1347 09/02/18 0333 09/03/18 0232  NA 135 136  137 137 130*  K 3.0* 3.1*  3.1* 4.1 4.7  CL 96* 98 99 96*  CO2 26  --  28 23  GLUCOSE 240* 243* 154* 431*  BUN 11 13 21* 25*  CREATININE 2.97* 2.80* 4.61* 4.47*  CALCIUM 10.4*  --  8.7* 8.8*  MG  --   --  1.8 2.0   Liver Function Tests: Recent Labs  Lab 09/01/18 1324 09/02/18 0333  AST 16 13*  ALT 11 10  ALKPHOS 42 34*  BILITOT 0.7 0.3  PROT 8.2* 6.5  ALBUMIN 3.4* 2.6*   Recent Labs  Lab 09/01/18 1324  LIPASE 70*   No results for input(s): AMMONIA in the last 168 hours. CBC: Recent Labs  Lab 09/01/18 1324 09/01/18 1347 09/02/18 0333 09/02/18 1258 09/03/18 0232  WBC 10.2  --  7.1 8.0 7.3  NEUTROABS 7.9*  --  4.6  --  4.7  HGB 9.2* 10.5*  10.9* 7.8* 9.1* 8.1*  HCT 30.7* 31.0*  32.0* 25.9* 30.9* 27.7*  MCV 87.7  --  87.5 89.0 89.1  PLT 316  --  234 257 275   Cardiac Enzymes: No results for input(s):  CKTOTAL, CKMB, CKMBINDEX, TROPONINI in the last 168 hours. BNP: Invalid input(s): POCBNP CBG: Recent Labs  Lab 09/01/18 2056 09/02/18 1159 09/02/18 1647 09/02/18 2055 09/03/18 0747  GLUCAP 327* 142* 260* 336* 400*   D-Dimer No results for input(s): DDIMER in the last 72 hours. Hgb A1c Recent Labs    09/01/18 1324  HGBA1C 10.1*   Lipid Profile No results for input(s): CHOL, HDL, LDLCALC, TRIG, CHOLHDL, LDLDIRECT in the last 72 hours. Thyroid function studies No results for input(s): TSH, T4TOTAL, T3FREE, THYROIDAB in the last 72 hours.  Invalid input(s): FREET3 Anemia work up No results for input(s): VITAMINB12, FOLATE, FERRITIN, TIBC, IRON, RETICCTPCT in the last 72 hours. Urinalysis    Component Value Date/Time   COLORURINE STRAW (A) 03/01/2018 1021   APPEARANCEUR CLEAR 03/01/2018 1021   LABSPEC 1.007 03/01/2018 1021   PHURINE 5.0 03/01/2018 1021   GLUCOSEU >=500 (A) 03/01/2018 1021   HGBUR SMALL (A) 03/01/2018 1021   HGBUR negative 01/26/2010 1026   BILIRUBINUR NEGATIVE 03/01/2018 1021   BILIRUBINUR NEG 01/01/2011 1050   KETONESUR NEGATIVE 03/01/2018 1021   PROTEINUR 100 (A) 03/01/2018 1021   UROBILINOGEN 0.2 01/09/2015 1333   NITRITE NEGATIVE 03/01/2018 1021   LEUKOCYTESUR TRACE (A) 03/01/2018 1021   Sepsis Labs Invalid input(s): PROCALCITONIN,  WBC,  LACTICIDVEN Microbiology Recent Results (from the past 240 hour(s))  SARS Coronavirus 2     Status: None   Collection Time: 09/01/18  5:32 PM  Result Value Ref Range Status   SARS Coronavirus 2 NOT DETECTED NOT DETECTED Final    Comment: (NOTE) SARS-CoV-2 target nucleic acids are NOT DETECTED. The SARS-CoV-2 RNA is generally detectable in upper and lower respiratory specimens during the acute phase of infection.  Negative  results do not preclude SARS-CoV-2 infection, do not rule out co-infections with other pathogens, and should not be used as the sole basis for treatment or other patient management  decisions.  Negative  results must be combined with clinical observations, patient history, and epidemiological information. The expected result is Not Detected. Fact Sheet for Patients: http://www.biofiredefense.com/wp-content/uploads/2020/03/BIOFIRE-COVID -19-patients.pdf Fact Sheet for Healthcare Providers: http://www.biofiredefense.com/wp-content/uploads/2020/03/BIOFIRE-COVID -19-hcp.pdf This test is not yet approved or cleared by the [place] and  has been authorized for detection and/or diagnosis of SARS-CoV-2 by FDA under an Emergency Use Authorization (EUA).  This EUA will remain in effec t (meaning this test can be used) for the duration of  the COVID-19 declaration under Section 564(b)(1) of the Act, 21 U.S.C. section 360bbb-3(b)(1), unless the authorization is terminated or revoked sooner. Performed at [facility], [place] [address], [place], [place] [address]   Culture, blood (routine x 2)     Status: None (Preliminary result)   Collection Time: 09/01/18  5:43 PM   Specimen: BLOOD RIGHT HAND  Result Value Ref Range Status   Specimen Description BLOOD RIGHT HAND  Final   Special Requests   Final    BOTTLES DRAWN AEROBIC ONLY Blood Culture adequate volume   Culture   Final    NO GROWTH < 24 HOURS Performed at [facility], [place] [address] [place], [place] [address]    Report Status PENDING  Incomplete  Culture, blood (routine x 2)     Status: None (Preliminary result)   Collection Time: 09/01/18  5:55 PM   Specimen: BLOOD LEFT HAND  Result Value Ref Range Status   Specimen Description BLOOD LEFT HAND  Final   Special Requests   Final    BOTTLES DRAWN AEROBIC AND ANAEROBIC Blood Culture results may not be optimal due to an inadequate volume of blood received in culture bottles   Culture   Final    NO GROWTH < 24 HOURS Performed at [facility], [place] [address] [place], [place] [address]    Report Status PENDING  Incomplete  MRSA PCR  Screening     Status: None   Collection Time: 09/02/18 12:05 AM   Specimen: Nasal Mucosa; Nasopharyngeal  Result Value Ref Range Status   MRSA by PCR NEGATIVE NEGATIVE Final    Comment:        The GeneXpert MRSA Assay (FDA approved for NASAL specimens only), is one component of a comprehensive MRSA colonization surveillance program. It is not intended to diagnose MRSA infection nor to guide or monitor treatment for MRSA infections. Performed at [facility], [place] [address] [place],  [address]      Time coordinating discharge: 35 minutes  SIGNED:   [name], MD  [facility] 09/03/2018, 11:21 AM

## 2018-09-04 LAB — URINE CULTURE

## 2018-09-05 LAB — TACROLIMUS LEVEL: Tacrolimus (FK506) - LabCorp: 2.3 ng/mL (ref 2.0–20.0)

## 2018-09-06 LAB — CULTURE, BLOOD (ROUTINE X 2)
Culture: NO GROWTH
Culture: NO GROWTH
Special Requests: ADEQUATE

## 2018-09-17 ENCOUNTER — Emergency Department (HOSPITAL_COMMUNITY)
Admission: EM | Admit: 2018-09-17 | Discharge: 2018-09-17 | Disposition: A | Discharge status: Home / Self Care | Payer: Medicare Other | Attending: Emergency Medicine | Admitting: Emergency Medicine

## 2018-09-17 ENCOUNTER — Encounter (HOSPITAL_COMMUNITY): Payer: Self-pay | Admitting: Emergency Medicine

## 2018-09-17 ENCOUNTER — Emergency Department (HOSPITAL_COMMUNITY): Payer: Medicare Other

## 2018-09-17 ENCOUNTER — Other Ambulatory Visit: Payer: Self-pay

## 2018-09-17 DIAGNOSIS — T8619 Other complication of kidney transplant: Secondary | ICD-10-CM | POA: Diagnosis not present

## 2018-09-17 DIAGNOSIS — R1031 Right lower quadrant pain: Secondary | ICD-10-CM | POA: Diagnosis present

## 2018-09-17 DIAGNOSIS — Y658 Other specified misadventures during surgical and medical care: Secondary | ICD-10-CM | POA: Diagnosis not present

## 2018-09-17 DIAGNOSIS — I150 Renovascular hypertension: Secondary | ICD-10-CM | POA: Diagnosis not present

## 2018-09-17 DIAGNOSIS — N1330 Unspecified hydronephrosis: Secondary | ICD-10-CM | POA: Diagnosis not present

## 2018-09-17 DIAGNOSIS — Z794 Long term (current) use of insulin: Secondary | ICD-10-CM | POA: Insufficient documentation

## 2018-09-17 DIAGNOSIS — Z940 Kidney transplant status: Secondary | ICD-10-CM | POA: Diagnosis not present

## 2018-09-17 DIAGNOSIS — Z992 Dependence on renal dialysis: Secondary | ICD-10-CM | POA: Diagnosis not present

## 2018-09-17 DIAGNOSIS — E1122 Type 2 diabetes mellitus with diabetic chronic kidney disease: Secondary | ICD-10-CM | POA: Diagnosis not present

## 2018-09-17 DIAGNOSIS — N186 End stage renal disease: Secondary | ICD-10-CM | POA: Insufficient documentation

## 2018-09-17 DIAGNOSIS — Z79899 Other long term (current) drug therapy: Secondary | ICD-10-CM | POA: Diagnosis not present

## 2018-09-17 LAB — CBC WITH DIFFERENTIAL/PLATELET
Abs Immature Granulocytes: 0.11 10*3/uL — ABNORMAL HIGH (ref 0.00–0.07)
Basophils Absolute: 0 10*3/uL (ref 0.0–0.1)
Basophils Relative: 0 %
Eosinophils Absolute: 0.1 10*3/uL (ref 0.0–0.5)
Eosinophils Relative: 1 %
HCT: 28.5 % — ABNORMAL LOW (ref 36.0–46.0)
Hemoglobin: 8.5 g/dL — ABNORMAL LOW (ref 12.0–15.0)
Immature Granulocytes: 1 %
Lymphocytes Relative: 19 %
Lymphs Abs: 2.3 10*3/uL (ref 0.7–4.0)
MCH: 26.2 pg (ref 26.0–34.0)
MCHC: 29.8 g/dL — ABNORMAL LOW (ref 30.0–36.0)
MCV: 88 fL (ref 80.0–100.0)
Monocytes Absolute: 0.9 10*3/uL (ref 0.1–1.0)
Monocytes Relative: 8 %
Neutro Abs: 8.9 10*3/uL — ABNORMAL HIGH (ref 1.7–7.7)
Neutrophils Relative %: 71 %
Platelets: 269 10*3/uL (ref 150–400)
RBC: 3.24 MIL/uL — ABNORMAL LOW (ref 3.87–5.11)
RDW: 16.6 % — ABNORMAL HIGH (ref 11.5–15.5)
WBC: 12.3 10*3/uL — ABNORMAL HIGH (ref 4.0–10.5)
nRBC: 0 % (ref 0.0–0.2)

## 2018-09-17 LAB — ACETAMINOPHEN LEVEL: Acetaminophen (Tylenol), Serum: <10 ug/mL — ABNORMAL LOW (ref 10–30)

## 2018-09-17 LAB — COMPREHENSIVE METABOLIC PANEL
ALT: 12 U/L (ref 0–44)
AST: 11 U/L — ABNORMAL LOW (ref 15–41)
Albumin: 3.1 g/dL — ABNORMAL LOW (ref 3.5–5.0)
Alkaline Phosphatase: 54 U/L (ref 38–126)
Anion gap: 15 (ref 5–15)
BUN: 52 mg/dL — ABNORMAL HIGH (ref 6–20)
CO2: 20 mmol/L — ABNORMAL LOW (ref 22–32)
Calcium: 7.5 mg/dL — ABNORMAL LOW (ref 8.9–10.3)
Chloride: 98 mmol/L (ref 98–111)
Creatinine, Ser: 7.54 mg/dL — ABNORMAL HIGH (ref 0.44–1.00)
GFR calc Af Amer: 7 mL/min — ABNORMAL LOW (ref >60)
GFR calc non Af Amer: 6 mL/min — ABNORMAL LOW (ref >60)
Glucose, Bld: 239 mg/dL — ABNORMAL HIGH (ref 70–99)
Potassium: 4 mmol/L (ref 3.5–5.1)
Sodium: 133 mmol/L — ABNORMAL LOW (ref 135–145)
Total Bilirubin: 0.6 mg/dL (ref 0.3–1.2)
Total Protein: 7.3 g/dL (ref 6.5–8.1)

## 2018-09-17 LAB — LIPASE, BLOOD: Lipase: 43 U/L (ref 11–51)

## 2018-09-17 LAB — I-STAT BETA HCG BLOOD, ED (MC, WL, AP ONLY): I-stat hCG, quantitative: <5 m[IU]/mL (ref <5)

## 2018-09-17 MED ORDER — MORPHINE SULFATE (PF) 4 MG/ML IV SOLN: 6.0000 mg | Freq: Once | INTRAVENOUS | Status: DC

## 2018-09-17 MED ORDER — OXYCODONE HCL 5 MG PO TABS: 5.0000 mg | ORAL_TABLET | Freq: Four times a day (QID) | ORAL | 0 refills | Status: DC | PRN

## 2018-09-17 MED ORDER — CARVEDILOL 12.5 MG PO TABS: 12.5000 mg | ORAL_TABLET | Freq: Once | ORAL | Status: DC

## 2018-09-17 MED ORDER — HYDRALAZINE HCL 25 MG PO TABS
50.0000 mg | ORAL_TABLET | Freq: Once | ORAL | Status: DC
  Filled 2018-09-17: qty 2

## 2018-09-17 MED ORDER — IOHEXOL 300 MG/ML  SOLN
75.0000 mL | Freq: Once | INTRAMUSCULAR | Status: AC | PRN
  Administered 2018-09-17: 75 mL via INTRAVENOUS

## 2018-09-17 MED ORDER — AMLODIPINE BESYLATE 5 MG PO TABS
10.0000 mg | ORAL_TABLET | Freq: Once | ORAL | Status: AC
  Administered 2018-09-17: 10:00:00 10 mg via ORAL
  Filled 2018-09-17: qty 2

## 2018-09-17 MED ORDER — MORPHINE SULFATE (PF) 4 MG/ML IV SOLN
4.0000 mg | Freq: Once | INTRAVENOUS | Status: AC
  Administered 2018-09-17: 06:00:00 4 mg via INTRAVENOUS
  Filled 2018-09-17: qty 1

## 2018-09-17 NOTE — ED Triage Notes (Signed)
Pt reported she was cold . Pt provided with 3 hot blankets and hand warmer packages.

## 2018-09-17 NOTE — ED Triage Notes (Signed)
Urine sample nor collected because Pt reports she does not produce urine

## 2018-09-17 NOTE — ED Triage Notes (Signed)
Patient with abdominal pain that started about one month ago.  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  Patient has been taking large amount of tylenol, tramadol, reglan and dicyclomine with no relief of her abdominal pain.

## 2018-09-17 NOTE — ED Provider Notes (Signed)
[facility] EMERGENCY DEPARTMENT Provider Note   CSN: [id number] Arrival date & time: 09/17/18  0346    History   Chief Complaint Chief Complaint  Patient presents with  . Abdominal Pain    HPI [name] is a 33 y.o. female with a hx of chronic kidney disease (status post kidney transplant in 2007 and subsequent failure with resumption of dialysis in January 2020), M/W/F dialysis, insulin-dependent diabetes, hypertension, XXX syndrome presents to the Emergency Department complaining of gradual, persistent, progressively worsening right lower quadrant abd pain onset 1 mo ago at the site of her kidney transplant.  Pt reports initially intermittent, but becoming constant and severe over the last few days.  Pt reports taking an unknown but "large" amount of extra strength tylenol for the pain over the last month, and increasing dosages in the last several days.  Pt reports taking 4 tablets of tylenol at 7pm.  Pt also taking reglan, tramadol, vicodin, dicyclomine without relief.   Pt reports pain is a 10/10.  Pt with hx of kidney transplant with rejection and restarting dialysis in Jan 2020. Last dialysis was Friday and was without reported complication.  She reports she makes some urine.  She does have a history of urinary tract infection.  Nothing seems to make patient's current pain better or worse.  She denies headache, neck pain, fever, chills, chest pain, shortness of breath, nausea, vomiting, diarrhea, weakness, dizziness, syncope.  Patient reports history of cholecystectomy but denies history of appendectomy.  The history is provided by the patient and medical records. No language interpreter was used.    Past Medical History:  Diagnosis Date  . Chronic kidney disease    kidney transplant 07  . Diabetes mellitus    Pt reports diagnosis in June 2011  . Hyperlipidemia   . Hypertension   . Kidney transplant recipient    solitary kidney  . LEARNING DISABILITY  09/25/2007   Qualifier: Diagnosis of  By: Deborra Medina MD, Tanja Port    . 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 Pyelonephritis 06/23/2014  . UTI (urinary tract infection) 01/09/2015  . 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 Hypertensive emergency 09/01/2018  . DKA (diabetic ketoacidosis) ([place]) 05/01/2018  . ESRD on hemodialysis ([place]) 05/01/2018  . Renal failure 03/18/2018  . Cellulitis of left leg 03/01/2018  . Hyperlipidemia 03/01/2018  . Hypocalcemia 03/01/2018  . Metabolic acidosis 68/34/1962  . Hypokalemia 02/03/2018  . Incontinence of bowel 02/03/2018  . Chronic pain 11/11/2017  . Gastroparesis   . Constipation 07/13/2017  . Hematemesis 07/02/2017  . Chronic cholecystitis 06/29/2017  . Type 1 diabetes mellitus with complication, uncontrolled ([place]) 05/25/2015  . Nausea & vomiting 01/09/2015  . Renal transplant recipient   . Immunosuppressed status ([place])   . 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 Chronic kidney disease 01/04/2010  . Chronic kidney disease, stage II (mild) 01/04/2010  . OVARIAN FAILURE, PREMATURE 03/11/2009  . 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 OBESITY 09/25/2007  . Anemia due to chronic kidney disease 09/25/2007  . LEARNING DISABILITY 09/25/2007  . Essential hypertension, benign 09/25/2007    Past Surgical History:  Procedure Laterality Date  . ARTERIOVENOUS GRAFT PLACEMENT Bilateral    "neither work" (10/24/2017)  . CHOLECYSTECTOMY N/A 06/30/2017   Procedure: LAPAROSCOPIC CHOLECYSTECTOMY WITH INTRAOPERATIVE CHOLANGIOGRAM;  Surgeon: Excell Seltzer, MD;  Location: [facility];  Service: General;  Laterality: N/A;  . ESOPHAGOGASTRODUODENOSCOPY (EGD) WITH PROPOFOL N/A 07/04/2017   Procedure: ESOPHAGOGASTRODUODENOSCOPY (EGD) WITH PROPOFOL;  Surgeon: Clarene Essex, MD;  Location: [facility]  ENDOSCOPY;  Service: Endoscopy;  Laterality: N/A;  . INSERTION OF DIALYSIS CATHETER N/A 03/20/2018   Procedure: INSERTION OF TUNNELED DIALYSIS CATHETER - RIGHT INTERANL JUGULAR PLACEMENT;  Surgeon: Angelia Mould, MD;  Location: [facility];  Service: Vascular;  Laterality: N/A;  . KIDNEY TRANSPLANT  2007  . PARATHYROIDECTOMY  ?2012   "3/4 removed" (10/24/2017)  . RENAL BIOPSY Bilateral 2003     OB History   No obstetric history on file.      Home Medications    Prior to Admission medications   Medication Sig Start Date End Date Taking? Authorizing Provider  ACCU-CHEK SOFTCLIX LANCETS lancets Use to check blood sugar 4 times per day. 12/29/15   Renato Shin, MD  acetaminophen (TYLENOL) 500 MG tablet Take 1,000 mg by mouth daily as needed for moderate pain.     [provider]  amLODipine (NORVASC) 10 MG tablet Take 1 tablet (10 mg total) by mouth daily. 03/25/18   Thurnell Lose, MD  b complex-vitamin c-folic acid (NEPHRO-VITE) 0.8 MG TABS tablet Take 1 tablet by mouth daily. After dialysis on dialysis days 07/19/18   [provider]  calcitRIOL (ROCALTROL) 0.25 MCG capsule Take 0.25 mcg by mouth daily.    [provider]  calcium acetate (PHOSLO) 667 MG capsule Take 1,334 mg by mouth 3 (three) times daily with meals.    [provider]  Calcium Carb-Cholecalciferol (CALCIUM 1000 + D PO) Take 1 tablet by mouth daily.     [provider]  calcium elemental as carbonate (BARIATRIC TUMS ULTRA) 400 MG chewable tablet Chew 1,000 mg by mouth 3 (three) times daily as needed for heartburn.     [provider]  carvedilol (COREG) 12.5 MG tablet Take 12.5 mg by mouth 2 (two) times daily with a meal.    [provider]  dicyclomine (BENTYL) 20 MG tablet Take 1 tablet (20 mg total) by mouth 2 (two) times daily. 02/06/18   [name], [name], MD  famotidine (PEPCID) 10 MG tablet Take 10 mg by mouth daily.    [provider]   fluticasone (FLONASE) 50 MCG/ACT nasal spray Place 2 sprays into both nostrils daily as needed for allergies. 09/03/18   [name], MD  gabapentin (NEURONTIN) 300 MG capsule Take 300 mg by mouth 3 (three) times daily.    [provider]  hydrALAZINE (APRESOLINE) 50 MG tablet Take 1 tablet (50 mg total) by mouth every 8 (eight) hours. 03/24/18   Thurnell Lose, MD  hydrOXYzine (ATARAX/VISTARIL) 50 MG tablet Take 1 tablet (50 mg total) by mouth 2 (two) times daily as needed for itching. 02/06/18   [name], Ripudeep K, MD  insulin aspart (NOVOLOG FLEXPEN) 100 UNIT/ML FlexPen Inject 12-15 Units into the skin See admin instructions. 5-6 times per day as needed    [provider]  insulin degludec (TRESIBA) 100 UNIT/ML SOPN FlexTouch Pen Inject 0.6 mLs (60 Units total) into the skin 2 (two) times daily. Reports taking 24 units QAM 03/06/18   Charlynne Cousins, MD  loperamide (IMODIUM) 2 MG capsule Take 2 mg by mouth as needed for diarrhea or loose stools.     [provider]  metoCLOPramide (REGLAN) 10 MG tablet Take 1 tablet (10 mg total) by mouth 3 (  three) times daily before meals. 02/06/18   [name], [name], MD  mycophenolate (MYFORTIC) 360 MG TBEC Take 720 mg by mouth 2 (two) times daily.     [provider]  ondansetron (ZOFRAN ODT) 4 MG disintegrating tablet Take 1 tablet (4 mg total) by mouth every 4 (four) hours as needed for nausea or vomiting. 10/26/17   Denton Brick, Courage, MD  pantoprazole (PROTONIX) 40 MG tablet Take 40 mg by mouth 2 (two) times daily.    [provider]  predniSONE (DELTASONE) 10 MG tablet Take 1 tablet (10 mg total) by mouth every morning. 03/06/18   Charlynne Cousins, MD  promethazine (PHENERGAN) 25 MG tablet Take 1 tablet (25 mg total) by mouth every 6 (six) hours as needed for nausea or vomiting. 02/06/18   [name], [name], MD  simvastatin (ZOCOR) 20 MG tablet Take 20 mg by mouth at bedtime.     [provider]   tacrolimus (PROGRAF) 1 MG capsule Take 3 mg by mouth 2 (two) times daily.     [provider]  traMADol (ULTRAM) 50 MG tablet Take 1 tablet (50 mg total) by mouth every 12 (twelve) hours as needed for moderate pain. 09/03/18 09/03/19  [name], MD    Family History Family History  Problem Relation Age of Onset  . Arthritis Mother   . Diabetes Mother   . Hypertension Mother     Social History Social History   Tobacco Use  . Smoking status: Never Smoker  . Smokeless tobacco: Never Used  Substance Use Topics  . Alcohol use: No  . Drug use: No     Allergies   Benadryl [diphenhydramine-zinc acetate], Motrin [ibuprofen], Banana, Diphenhydramine hcl, Doxycycline, Iron dextran, and Shellfish allergy   Review of Systems Review of Systems  Constitutional: Negative for appetite change, diaphoresis, fatigue, fever and unexpected weight change.  HENT: Negative for mouth sores.   Eyes: Negative for visual disturbance.  Respiratory: Negative for cough, chest tightness, shortness of breath and wheezing.   Cardiovascular: Negative for chest pain.  Gastrointestinal: Positive for abdominal pain. Negative for constipation, diarrhea, nausea and vomiting.  Endocrine: Negative for polydipsia, polyphagia and polyuria.  Genitourinary: Negative for dysuria, frequency, hematuria and urgency.  Musculoskeletal: Negative for back pain and neck stiffness.  Skin: Negative for rash.  Allergic/Immunologic: Negative for immunocompromised state.  Neurological: Negative for syncope, light-headedness and headaches.  Hematological: Does not bruise/bleed easily.  Psychiatric/Behavioral: Negative for sleep disturbance. The patient is not nervous/anxious.      Physical Exam Updated Vital Signs BP (!) 172/113 (BP Location: Left Arm)   Pulse (!) 103   Temp 98 F (36.7 C) (Oral)   Resp 17   Ht 5\' 6"  (1.676 m)   Wt 78.9 kg   SpO2 97%   BMI 28.08 kg/m   Physical Exam Vitals signs and  nursing note reviewed.  Constitutional:      General: She is not in acute distress.    Appearance: She is not diaphoretic.  HENT:     Head: Normocephalic.  Eyes:     General: No scleral icterus.    Conjunctiva/sclera: Conjunctivae normal.  Neck:     Musculoskeletal: Normal range of motion.  Cardiovascular:     Rate and Rhythm: Regular rhythm. Tachycardia present.     Pulses: Normal pulses.          Radial pulses are 2+ on the right side and 2+ on the left side.  Pulmonary:     Effort:  No tachypnea, accessory muscle usage, prolonged expiration, respiratory distress or retractions.     Breath sounds: No stridor.     Comments: Equal chest rise. No increased work of breathing. Abdominal:     General: There is no distension.     Palpations: Abdomen is soft.     Tenderness: There is abdominal tenderness in the right lower quadrant and periumbilical area. There is guarding. There is no right CVA tenderness, left CVA tenderness or rebound.  Musculoskeletal:     Comments: Moves all extremities equally and without difficulty.  Skin:    General: Skin is warm and dry.     Capillary Refill: Capillary refill takes less than 2 seconds.  Neurological:     Mental Status: She is alert.     GCS: GCS eye subscore is 4. GCS verbal subscore is 5. GCS motor subscore is 6.     Comments: Speech is clear and goal oriented.  Psychiatric:     Comments: Patient tearful throughout history and exam      ED Treatments / Results  Labs (all labs ordered are listed, but only abnormal results are displayed) Labs Reviewed  CBC WITH DIFFERENTIAL/PLATELET - Abnormal; Notable for the following components:      Result Value   WBC 12.3 (*)    RBC 3.24 (*)    Hemoglobin 8.5 (*)    HCT 28.5 (*)    MCHC 29.8 (*)    RDW 16.6 (*)    Neutro Abs 8.9 (*)    Abs Immature Granulocytes 0.11 (*)    All other components within normal limits  COMPREHENSIVE METABOLIC PANEL - Abnormal; Notable for the following  components:   Sodium 133 (*)    CO2 20 (*)    Glucose, Bld 239 (*)    BUN 52 (*)    Creatinine, Ser 7.54 (*)    Calcium 7.5 (*)    Albumin 3.1 (*)    AST 11 (*)    GFR calc non Af Amer 6 (*)    GFR calc Af Amer 7 (*)    All other components within normal limits  ACETAMINOPHEN LEVEL - Abnormal; Notable for the following components:   Acetaminophen (Tylenol), Serum <10 (*)    All other components within normal limits  LIPASE, BLOOD  URINALYSIS, ROUTINE W REFLEX MICROSCOPIC  I-STAT BETA HCG BLOOD, ED ([facility], [facility], [facility] ONLY)  CBG MONITORING, ED    EKG EKG Interpretation  Date/Time:  Sunday September 17 2018 04:22:41 EDT Ventricular Rate:  101 PR Interval:    QRS Duration: 84 QT Interval:  373 QTC Calculation: 484 R Axis:   59 Text Interpretation:  Sinus tachycardia Borderline prolonged QT interval When compared with ECG of 04/02/2018, QT has lengthened Nonspecific T wave abnormality is no longer present Confirmed by Delora Fuel ([id number]) on 09/17/2018 5:50:22 AM    Procedures Procedures (including critical care time)  Medications Ordered in ED Medications  amLODipine (NORVASC) tablet 10 mg (has no administration in time range)  carvedilol (COREG) tablet 12.5 mg (has no administration in time range)  hydrALAZINE (APRESOLINE) tablet 50 mg (has no administration in time range)  morphine 4 MG/ML injection 4 mg (4 mg Intravenous Given 09/17/18 0537)     Initial Impression / Assessment and Plan / ED Course  I have reviewed the triage vital signs and the nursing notes.  Pertinent labs & imaging results that were available during my care of the patient were reviewed by me and considered in my medical  decision making (see chart for details).  Clinical Course as of Sep 17 706  Sun Sep 17, 2018  0659 BP improved.    BP(!): 144/110 [HM]    Clinical Course User Index [HM] Wilian Kwong, Gwenlyn Perking        Presents with right lower quadrant abdominal pain.  This is the site of her kidney  transplant.  Patient does still have her appendix.  Patient has been seen for this before.  Today with mild leukocytosis.  Will obtain CT scan to evaluate for alternative pathology to patient's pain.  Pain control ordered.  Abs with elevated serum creatinine as anticipated but normal potassium.  Somewhat elevated glucose.  Patient is found to be hypertensive here.  Home meds (amlodipine, carvedilol and hydroxyzine) given.  Patient has a history of hypertensive crisis.  She has no chest pain or shortness of breath today.  EKG is without ischemia.  Mildly prolonged QT noted.  hemoglobin of 8.5 appears to be baseline for patient.  Pregnancy test negative.   The patient was discussed with and seen by Dr. Roxanne Mins who agrees with the treatment plan.  6:58 AM Pain and blood pressure improved.  Patient pending CT scan.  If this is negative, anticipate discharge home.  At shift change care was transferred to Armstead Peaks, PA-C who will follow pending studies, re-evaulate and determine disposition.     Final Clinical Impressions(s) / ED Diagnoses   Final diagnoses:  RLQ abdominal pain  Renovascular hypertension    ED Discharge Orders    None       Primrose Oler, Gwenlyn Perking 20/80/22 3361    Delora Fuel, MD 22/44/97 [phone]

## 2018-09-17 NOTE — ED Notes (Signed)
Tech transported pt from room 29 to 38. Pt seemed agitated toward this tech. Pt stated, "why stay here if y'all are't going to help me. Why are you moving me to a loud area." Pt was educated on our progressive beds in yellow. When tech got pt in to room 38 pt stated, "its cold in here. Go turn the heat up." Tech told pt that I would get her warm blankets and try to turn the thermostat up. Pt then stated, "just go get the nurse." Tech notified Luellen Pucker, RN of situation.

## 2018-09-17 NOTE — ED Notes (Signed)
Pt does not produce urine

## 2018-09-17 NOTE — ED Provider Notes (Signed)
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  She has pain at the site of the kidney, however does still have her appendix.  CT is pending at shift change.  Plan to follow-up with her transplant doctor in [place].  Labs are stable for the patient.  CT abdomen pelvis shows renal transplant appears to be edematous with moderate hydronephrosis, uncertain if represents obstruction or possibly infection.  This was noted on previous exam.  She is Art[place] been following up with the transplant team.  Patient has been taking Tylenol, tramadol, and Vicodin without relief.  Her pain has been difficult to control here.  After further discussion with my attending, Dr. Sherry Ruffing, will discharge patient home with 5 oxycodone that she is advised not to mix with anything, until she can follow-up with her doctor in the next couple days.  She understands and agrees with plan.  Return precautions discussed.  Patient understands and agrees with plan.  Patient vitals stable throughout ED course and discharged in satisfactory condition.   Frederica Kuster, PA-C 09/17/18 1717    Tegeler, Gwenyth Allegra, MD 09/17/18 1901

## 2018-09-17 NOTE — ED Notes (Signed)
Patient verbalizes understanding of discharge instructions . Opportunity for questions and answers were provided . Armband removed by staff ,Pt discharged from ED. W/C  offered at D/C  and Declined W/C at D/C and was escorted to lobby by RN.  

## 2018-09-17 NOTE — Discharge Instructions (Addendum)
You have been provided short course of pain medication.  DO NOT combine this with any other pain medication. Please follow-up with your doctor in [place] as soon as possible for further evaluation and treatment of your kidney pain.  Please return the emergency department develop any new or worsening symptoms including fever, intractable vomiting, or passing out.  Do not drink alcohol, drive, operate machinery or participate in any other potentially dangerous activities while taking opiate pain medication as it may make you sleepy. Do not take this medication with any other sedating medications, either prescription or over-the-counter. If you were prescribed Percocet or Vicodin, do not take these with acetaminophen (Tylenol) as it is already contained within these medications and overdose of Tylenol is dangerous.   This medication is an opiate (or narcotic) pain medication and can be habit forming.  Use it as little as possible to achieve adequate pain control.  Do not use or use it with extreme caution if you have a history of opiate abuse or dependence. This medication is intended for your use only - do not give any to anyone else and keep it in a secure place where nobody else, especially children, have access to it. It will also cause or worsen constipation, so you may want to consider taking an over-the-counter stool softener while you are taking this medication.

## 2018-09-29 ENCOUNTER — Emergency Department (HOSPITAL_COMMUNITY)
Admission: EM | Admit: 2018-09-29 | Discharge: 2018-09-29 | Disposition: A | Discharge status: Home / Self Care | Payer: Medicare Other | Attending: Emergency Medicine | Admitting: Emergency Medicine

## 2018-09-29 ENCOUNTER — Encounter (HOSPITAL_COMMUNITY): Payer: Self-pay | Admitting: Emergency Medicine

## 2018-09-29 DIAGNOSIS — Z5321 Procedure and treatment not carried out due to patient leaving prior to being seen by health care provider: Secondary | ICD-10-CM | POA: Insufficient documentation

## 2018-09-29 DIAGNOSIS — R109 Unspecified abdominal pain: Secondary | ICD-10-CM | POA: Diagnosis not present

## 2018-09-29 LAB — CBC
HCT: 30.8 % — ABNORMAL LOW (ref 36.0–46.0)
Hemoglobin: 9.3 g/dL — ABNORMAL LOW (ref 12.0–15.0)
MCH: 26.3 pg (ref 26.0–34.0)
MCHC: 30.2 g/dL (ref 30.0–36.0)
MCV: 87 fL (ref 80.0–100.0)
Platelets: 324 10*3/uL (ref 150–400)
RBC: 3.54 MIL/uL — ABNORMAL LOW (ref 3.87–5.11)
RDW: 16.2 % — ABNORMAL HIGH (ref 11.5–15.5)
WBC: 9.5 10*3/uL (ref 4.0–10.5)
nRBC: 0 % (ref 0.0–0.2)

## 2018-09-29 LAB — BASIC METABOLIC PANEL
Anion gap: 13 (ref 5–15)
BUN: 22 mg/dL — ABNORMAL HIGH (ref 6–20)
CO2: 22 mmol/L (ref 22–32)
Calcium: 9.6 mg/dL (ref 8.9–10.3)
Chloride: 98 mmol/L (ref 98–111)
Creatinine, Ser: 5.48 mg/dL — ABNORMAL HIGH (ref 0.44–1.00)
GFR calc Af Amer: 11 mL/min — ABNORMAL LOW (ref >60)
GFR calc non Af Amer: 9 mL/min — ABNORMAL LOW (ref >60)
Glucose, Bld: 195 mg/dL — ABNORMAL HIGH (ref 70–99)
Potassium: 3.5 mmol/L (ref 3.5–5.1)
Sodium: 133 mmol/L — ABNORMAL LOW (ref 135–145)

## 2018-09-29 LAB — I-STAT BETA HCG BLOOD, ED (MC, WL, AP ONLY): I-stat hCG, quantitative: <5 m[IU]/mL (ref <5)

## 2018-10-17 ENCOUNTER — Other Ambulatory Visit: Payer: Self-pay

## 2018-10-17 ENCOUNTER — Encounter: Payer: Self-pay | Admitting: Vascular Surgery

## 2018-10-17 ENCOUNTER — Encounter: Payer: Self-pay | Admitting: *Deleted

## 2018-10-17 ENCOUNTER — Ambulatory Visit (INDEPENDENT_AMBULATORY_CARE_PROVIDER_SITE_OTHER): Payer: Medicare Other | Admitting: Vascular Surgery

## 2018-10-17 ENCOUNTER — Other Ambulatory Visit (HOSPITAL_COMMUNITY): Payer: Medicare Other

## 2018-10-17 ENCOUNTER — Other Ambulatory Visit (HOSPITAL_COMMUNITY)
Admission: RE | Admit: 2018-10-17 | Discharge: 2018-10-17 | Disposition: A | Discharge status: Home / Self Care | Payer: Medicare Other | Source: Ambulatory Visit | Attending: Vascular Surgery | Admitting: Vascular Surgery

## 2018-10-17 ENCOUNTER — Other Ambulatory Visit: Payer: Self-pay | Admitting: *Deleted

## 2018-10-17 VITALS — BP 158/90 | HR 98 | Temp 97.8°F | Resp 14 | Ht 67.0 in | Wt 176.4 lb

## 2018-10-17 DIAGNOSIS — N186 End stage renal disease: Secondary | ICD-10-CM

## 2018-10-17 DIAGNOSIS — Z01812 Encounter for preprocedural laboratory examination: Secondary | ICD-10-CM | POA: Insufficient documentation

## 2018-10-17 DIAGNOSIS — Z20828 Contact with and (suspected) exposure to other viral communicable diseases: Secondary | ICD-10-CM | POA: Diagnosis not present

## 2018-10-17 DIAGNOSIS — Z992 Dependence on renal dialysis: Secondary | ICD-10-CM | POA: Diagnosis not present

## 2018-10-17 LAB — SARS CORONAVIRUS 2 (TAT 6-24 HRS): SARS Coronavirus 2: NEGATIVE
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
## 2018-10-19 ENCOUNTER — Other Ambulatory Visit: Payer: Self-pay

## 2018-10-19 ENCOUNTER — Ambulatory Visit (HOSPITAL_COMMUNITY)
Admission: RE | Admit: 2018-10-19 | Discharge: 2018-10-19 | Disposition: A | Discharge status: Home / Self Care | Payer: Medicare Other | Attending: Vascular Surgery | Admitting: Vascular Surgery

## 2018-10-19 ENCOUNTER — Encounter (HOSPITAL_COMMUNITY): Payer: Self-pay | Admitting: Vascular Surgery

## 2018-10-19 ENCOUNTER — Other Ambulatory Visit: Payer: Self-pay | Admitting: *Deleted

## 2018-10-19 ENCOUNTER — Encounter (HOSPITAL_COMMUNITY)
Admission: RE | Disposition: A | Discharge status: Home / Self Care | Payer: Self-pay | Source: Home / Self Care | Attending: Vascular Surgery

## 2018-10-19 DIAGNOSIS — E1122 Type 2 diabetes mellitus with diabetic chronic kidney disease: Secondary | ICD-10-CM | POA: Insufficient documentation

## 2018-10-19 DIAGNOSIS — N186 End stage renal disease: Secondary | ICD-10-CM | POA: Insufficient documentation

## 2018-10-19 DIAGNOSIS — E785 Hyperlipidemia, unspecified: Secondary | ICD-10-CM | POA: Insufficient documentation

## 2018-10-19 DIAGNOSIS — Z888 Allergy status to other drugs, medicaments and biological substances status: Secondary | ICD-10-CM | POA: Diagnosis not present

## 2018-10-19 DIAGNOSIS — I120 Hypertensive chronic kidney disease with stage 5 chronic kidney disease or end stage renal disease: Secondary | ICD-10-CM | POA: Diagnosis not present

## 2018-10-19 DIAGNOSIS — Z79899 Other long term (current) drug therapy: Secondary | ICD-10-CM | POA: Diagnosis not present

## 2018-10-19 DIAGNOSIS — T82510A Breakdown (mechanical) of surgically created arteriovenous fistula, initial encounter: Secondary | ICD-10-CM | POA: Diagnosis not present

## 2018-10-19 DIAGNOSIS — Z992 Dependence on renal dialysis: Secondary | ICD-10-CM

## 2018-10-19 DIAGNOSIS — Z881 Allergy status to other antibiotic agents status: Secondary | ICD-10-CM | POA: Diagnosis not present

## 2018-10-19 DIAGNOSIS — T8612 Kidney transplant failure: Secondary | ICD-10-CM | POA: Insufficient documentation

## 2018-10-19 DIAGNOSIS — Z794 Long term (current) use of insulin: Secondary | ICD-10-CM | POA: Insufficient documentation

## 2018-10-19 DIAGNOSIS — Y841 Kidney dialysis as the cause of abnormal reaction of the patient, or of later complication, without mention of misadventure at the time of the procedure: Secondary | ICD-10-CM | POA: Insufficient documentation

## 2018-10-19 HISTORY — PX: UPPER EXTREMITY VENOGRAPHY: CATH118272

## 2018-10-19 LAB — POCT I-STAT, CHEM 8
BUN: 39 mg/dL — ABNORMAL HIGH (ref 6–20)
Calcium, Ion: 1.09 mmol/L — ABNORMAL LOW (ref 1.15–1.40)
Chloride: 97 mmol/L — ABNORMAL LOW (ref 98–111)
Creatinine, Ser: 6.3 mg/dL — ABNORMAL HIGH (ref 0.44–1.00)
Glucose, Bld: 296 mg/dL — ABNORMAL HIGH (ref 70–99)
HCT: 26 % — ABNORMAL LOW (ref 36.0–46.0)
Hemoglobin: 8.8 g/dL — ABNORMAL LOW (ref 12.0–15.0)
Potassium: 4.4 mmol/L (ref 3.5–5.1)
Sodium: 134 mmol/L — ABNORMAL LOW (ref 135–145)
TCO2: 27 mmol/L (ref 22–32)

## 2018-10-19 LAB — GLUCOSE, CAPILLARY: Glucose-Capillary: 296 mg/dL — ABNORMAL HIGH (ref 70–99)

## 2018-10-19 LAB — HCG, SERUM, QUALITATIVE: Preg, Serum: NEGATIVE

## 2018-10-19 SURGERY — UPPER EXTREMITY VENOGRAPHY
Anesthesia: LOCAL | Laterality: Bilateral

## 2018-10-19 MED ORDER — IODIXANOL 320 MG/ML IV SOLN
INTRAVENOUS | Status: DC | PRN
  Administered 2018-10-19: 45 mL via INTRA_ARTERIAL

## 2018-10-19 MED ORDER — SODIUM CHLORIDE 0.9% FLUSH: 3.0000 mL | Freq: Two times a day (BID) | INTRAVENOUS | Status: DC

## 2018-10-19 MED ORDER — SODIUM CHLORIDE 0.9% FLUSH: 3.0000 mL | INTRAVENOUS | Status: DC | PRN

## 2018-10-19 MED ORDER — SODIUM CHLORIDE 0.9 % IV SOLN: 250.0000 mL | INTRAVENOUS | Status: DC | PRN

## 2018-10-19 SURGICAL SUPPLY — 1 items: STOPCOCK MORSE 400PSI 3WAY (MISCELLANEOUS) ×4 IMPLANT

## 2018-10-19 NOTE — Discharge Instructions (Signed)
Venogram A venogram, or venography, is a procedure that uses an X-ray and dye (contrast) to examine how well the veins work and how blood flows through them. Contrast helps the veins show up on X-rays. A venogram may be done:  To evaluate vein abnormalities.  To identify clots within veins, such as deep vein thrombosis (DVT).  To map out the veins that might be needed for another procedure. Tell a health care provider about:  Any allergies you have, especially to medicines, shellfish, iodine, and contrast.  All medicines you are taking, including vitamins, herbs, eye drops, creams, and over-the-counter medicines.  Any problems you or family members have had with anesthetic medicines.  Any blood disorders you have.  Any surgeries you have had and any complications that occurred.  Any medical conditions you have.  Whether you are pregnant, may be pregnant, or are breastfeeding.  Any history of smoking or tobacco use. What are the risks? Generally, this is a safe procedure. However, problems may occur, including:  Infection.  Bleeding.  Blood clots.  Allergic reaction to medicines or contrast.  Damage to other structures or organs.  Kidney problems.  X-ray exposure. Being exposed to too much radiation over a lifetime can increase the risk of cancer. The risk of this is small. What happens before the procedure? Medicines Ask your health care provider about:  Changing or stopping your normal medicines. This is especially important if you take diabetes medicines or blood thinners.  Taking medicines such as aspirin and ibuprofen. These medicines can thin your blood. Do not take these medicines before your procedure if your doctor instructs you not to. General instructions  You may have blood tests to check how well your kidneys and liver are working and how well your blood can clot.  Plan to have someone take you home from the hospital or clinic.  Follow instructions  from your health care provider about eating and drinking. What happens during the procedure?   An IV will be inserted into one of your veins.  You may be given a medicine to help you relax (sedative).  You will lie down on an X-ray table. The table may be tilted in different directions during the procedure to help the contrast move throughout your body. Safety straps will keep you secure if the table is tilted.  If veins in your arm or leg will be examined, a band may be wrapped around that arm or leg to keep the veins full of blood. This may cause your arm or leg to feel numb.  The contrast will be injected into your IV tube. You may notice a hot, flushed feeling as it moves throughout your body. You may also notice a metallic taste in your mouth. Both of these sensations will go away after the test is complete.  You may be asked to lie in different positions or place your legs or arms in different positions.  At the end of the procedure, you may be given IV fluids to help wash (flush) the contrast out of your veins.  The IV tube will be removed, and pressure will be applied to the IV site to prevent bleeding. A bandage (dressing) may be applied to the IV site. What happens after the procedure?  Your blood pressure, heart rate, breathing rate, and blood oxygen level will be monitored until the medicines you were given have worn off.  You may be given something to eat and drink.  You will be instructed to drink  a lot of fluids for the rest of the day and the next day. This helps to help flush the contrast out of your body.  If you were given a sedative, do not drive for 24 hours or until your health care provider approves. Summary  A venogram, or venography, is a procedure that uses an X-ray and dye (contrast) to examine how well the veins work and how blood flows through them. Contrast is a dye that helps the veins show up on X-rays.  An IV tube will be inserted into one of your  veins in order to inject the contrast.  During the exam, you will lie on an X-ray table. The table may be tilted in different directions during the procedure to help the contrast move throughout your body. Safety straps will keep you secure.  After the procedure, you will need to drink a lot of fluids to help wash (flush) the contrast out of your body. This information is not intended to replace advice given to you by your health care provider. Make sure you discuss any questions you have with your health care provider. Document Released: 02/17/2009 Document Revised: 02/11/2017 Document Reviewed: 01/24/2016 Elsevier Patient Education  2020 [employer].

## 2018-10-19 NOTE — Op Note (Signed)
Date: October 19, 2018  Preoperative diagnosis: End-stage renal disease with failed kidney transplant and need for new dialysis access access in the setting of failed bilateral upper extremity AV grafts  Postoperative diagnosis: Same   Procedure: Bilateral upper extremity venogram  Surgeon: Dr. [name], MD  Indications: Patient is a 33 year old female that presents for new dialysis access evaluation.  She has failed kidney transplant and is currently dialyzing via right IJ tunneled catheter.  She has occluded bilateral upper arm AV grafts.  Her vein mapping suggested a marginal left basilic vein.  She has refused any groin access in the past.  Presents for venogram to ensure no central venous occlusion or stenosis.  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  There is no evidence of central venous stenosis or occlusion on the left.  Does appear to have a marginal left basilic vein.  Details: Patient had 22-gauge IVs placed in the bilateral forearms in preoperative holding prior to the procedure.  She was then brought to [facility] lab 8 and she was transferred to the table in supine position.  Both her arms were then tucked by her side.  A prep timeout was performed to identify patient procedure and site.  Initially started by injecting contrast in the left arm forearm IV where we got initial imaging of the central venous structures with no evidence of central venous stenosis or occlusion.  We then got imaged from the antecubitum up to the shoulder that did show a marginal basilic vein that could potentially  be usable for AV fistula placement.  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  I do not think her right arm is usable at this point.  At that point in time the procedure was complete.  She  tolerated with no complications.  Contrast: 45 mL's  Complication: None  Condition: Stable  Plan: Consider for left arm AVF with basilic vein versus redo AV graft  [name], MD [facility] Office: [phone] Pager: [place]

## 2018-10-19 NOTE — Progress Notes (Signed)
Discharge instructions reviewed with pt voices understanding.  
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
## 2018-10-19 NOTE — Progress Notes (Signed)
Dr Carlis Abbott in to see pt. Informed of bp reading. States ok for pt to discharge once her ride gets here

## 2018-10-19 NOTE — Progress Notes (Signed)
Phone call to patient to review pre-op instructions for surgery with Dr. Carlis Abbott. Nasal swab testing at [facility] scheduled for 10/23/2018. To be at [facility] admitting at 10 am on 10/26/2018 for HD access. NPO past MN night prior, must have a driver and caregiver for discharge to home. Expect a phone call and follow the detailed surgery, medication and insulin adjustment instructions received from the hospital pre-admission testing department. Verbalized understanding.

## 2018-10-19 NOTE — Progress Notes (Addendum)
CBG 296 and Bp elevated Valerie Harding, Rn informed. States she doesn't have periods and never really has.

## 2018-10-23 ENCOUNTER — Inpatient Hospital Stay (HOSPITAL_COMMUNITY): Admission: RE | Admit: 2018-10-23 | Payer: Medicare Other | Source: Ambulatory Visit

## 2018-10-24 ENCOUNTER — Other Ambulatory Visit (HOSPITAL_COMMUNITY)
Admission: RE | Admit: 2018-10-24 | Discharge: 2018-10-24 | Disposition: A | Discharge status: Home / Self Care | Payer: Medicare Other | Source: Ambulatory Visit | Attending: Vascular Surgery | Admitting: Vascular Surgery

## 2018-10-24 ENCOUNTER — Encounter (HOSPITAL_COMMUNITY): Payer: Self-pay | Admitting: Vascular Surgery

## 2018-10-24 DIAGNOSIS — Z20828 Contact with and (suspected) exposure to other viral communicable diseases: Secondary | ICD-10-CM | POA: Insufficient documentation

## 2018-10-24 DIAGNOSIS — Z01812 Encounter for preprocedural laboratory examination: Secondary | ICD-10-CM | POA: Insufficient documentation

## 2018-10-24 LAB — SARS CORONAVIRUS 2 (TAT 6-24 HRS): SARS Coronavirus 2: NEGATIVE

## 2018-10-25 ENCOUNTER — Other Ambulatory Visit: Payer: Self-pay

## 2018-10-25 ENCOUNTER — Encounter (HOSPITAL_COMMUNITY): Payer: Self-pay | Admitting: *Deleted

## 2018-10-25 NOTE — Progress Notes (Signed)
Patient denies shortness of breath, fever, cough and chest pain.  PCP - Dr [name], [facility] Cardiologist - Denies Endocrinology-Dr Melton Alar  Chest x-ray - 08/14/18 1 view, 03/18/18 2 view EKG - 09/17/18 Stress Test - Denies ECHO - 06/24/14 Cardiac Cath - Denies  Fasting Blood Sugar - 80-100s Checks Blood Sugar _2-3_ times a day  THE MORNING OF SURGERY, take 10 units of Tresiba insulin.  . If your CBG is greater than 220 mg/dL, you may take  of your sliding scale (correction) dose of Novolog insulin.  o Check your blood sugar the morning of your surgery when you wake up and every 2 hours until you get to the Short Stay unit. . If your blood sugar is less than 70 mg/dL, you will need to treat for low blood sugar: o Do not take insulin. o Treat a low blood sugar (less than 70 mg/dL) with  cup of clear juice (cranberry or apple), 4 glucose tablets, OR glucose gel. o Recheck blood sugar in 15 minutes after treatment (to make sure it is greater than 70 mg/dL). If your blood sugar is not greater than 70 mg/dL on recheck, call [phone] for further instructions.  Anesthesia review: Yes, James, PA  STOP now taking any Aspirin (unless otherwise instructed by your surgeon), Aleve, Naproxen, Ibuprofen, Motrin, Advil, Goody's, BC's, all herbal medications, fish oil, and all vitamins.   Coronavirus Screening Have you or your friend Evette Doffing experienced the following symptoms:  Cough yes/no: No Fever (>100.13F)  yes/no: No Runny nose yes/no: No Sore throat yes/no: No Difficulty breathing/shortness of breath  yes/no: No  Have you or your friend traveled in the last 14 days and where? yes/no: No

## 2018-10-25 NOTE — Anesthesia Preprocedure Evaluation (Addendum)
Anesthesia Evaluation  Patient identified by MRN, date of birth, ID band Patient awake    Reviewed: Allergy & Precautions, NPO status , Patient's Chart, lab work & pertinent test results  Airway Mallampati: II  TM Distance: >3 FB Neck ROM: Full    Dental  (+) Teeth Intact, Dental Advisory Given   Pulmonary    breath sounds clear to auscultation       Cardiovascular hypertension,  Rhythm:Regular Rate:Normal     Neuro/Psych    GI/Hepatic   Endo/Other  diabetes  Renal/GU      Musculoskeletal   Abdominal   Peds  Hematology   Anesthesia Other Findings   Reproductive/Obstetrics                            Anesthesia Physical Anesthesia Plan  ASA: III  Anesthesia Plan: MAC   Post-op Pain Management:    Induction: Intravenous  PONV Risk Score and Plan: Ondansetron  Airway Management Planned: Natural Airway and Simple Face Mask  Additional Equipment:   Intra-op Plan:   Post-operative Plan:   Informed Consent: I have reviewed the patients History and Physical, chart, labs and discussed the procedure including the risks, benefits and alternatives for the proposed anesthesia with the patient or authorized representative who has indicated his/her understanding and acceptance.     Dental advisory given  Plan Discussed with:   Anesthesia Plan Comments: (Hx of ESRD s/p deceased donor kidney transplant (2007) failure (2020) now dialyzing via RIJ tunneled catheter.  Poorly controlled IDDMII with gastroparesis, followed by endocrinology at [facility].  XXX syndrome with intellectual disability. She also has documented history of pseudoseizures.   TTE 06/24/14: - Left ventricle: The cavity size was normal. Wall thickness was  normal. Systolic function was normal. The estimated ejection  fraction was in the range of 55% to 60%. Wall motion was normal;  there were no regional wall motion  abnormalities. Left  ventricular diastolic function parameters were normal. )       Anesthesia Quick Evaluation

## 2018-10-26 ENCOUNTER — Ambulatory Visit (HOSPITAL_COMMUNITY): Payer: Medicare Other | Admitting: Physician Assistant

## 2018-10-26 ENCOUNTER — Encounter (HOSPITAL_COMMUNITY)
Admission: RE | Disposition: A | Discharge status: Home / Self Care | Payer: Self-pay | Source: Home / Self Care | Attending: Vascular Surgery

## 2018-10-26 ENCOUNTER — Ambulatory Visit (HOSPITAL_COMMUNITY)
Admission: RE | Admit: 2018-10-26 | Discharge: 2018-10-26 | Disposition: A | Discharge status: Home / Self Care | Payer: Medicare Other | Source: Home / Self Care | Attending: Vascular Surgery | Admitting: Vascular Surgery

## 2018-10-26 ENCOUNTER — Other Ambulatory Visit: Payer: Self-pay

## 2018-10-26 ENCOUNTER — Encounter (HOSPITAL_COMMUNITY): Payer: Self-pay | Admitting: *Deleted

## 2018-10-26 DIAGNOSIS — N186 End stage renal disease: Secondary | ICD-10-CM

## 2018-10-26 DIAGNOSIS — T8143XA Infection following a procedure, organ and space surgical site, initial encounter: Secondary | ICD-10-CM | POA: Diagnosis not present

## 2018-10-26 DIAGNOSIS — Z992 Dependence on renal dialysis: Secondary | ICD-10-CM

## 2018-10-26 HISTORY — DX: Other seasonal allergic rhinitis: J30.2

## 2018-10-26 HISTORY — PX: AV FISTULA PLACEMENT: SHX1204

## 2018-10-26 HISTORY — DX: Gastro-esophageal reflux disease without esophagitis: K21.9

## 2018-10-26 LAB — GLUCOSE, CAPILLARY
Glucose-Capillary: 360 mg/dL — ABNORMAL HIGH (ref 70–99)
Glucose-Capillary: 331 mg/dL — ABNORMAL HIGH (ref 70–99)
Glucose-Capillary: 263 mg/dL — ABNORMAL HIGH (ref 70–99)
Glucose-Capillary: 241 mg/dL — ABNORMAL HIGH (ref 70–99)

## 2018-10-26 LAB — POCT I-STAT, CHEM 8
BUN: 36 mg/dL — ABNORMAL HIGH (ref 6–20)
Calcium, Ion: 0.92 mmol/L — ABNORMAL LOW (ref 1.15–1.40)
Chloride: 94 mmol/L — ABNORMAL LOW (ref 98–111)
Creatinine, Ser: 6.7 mg/dL — ABNORMAL HIGH (ref 0.44–1.00)
Glucose, Bld: 365 mg/dL — ABNORMAL HIGH (ref 70–99)
HCT: 22 % — ABNORMAL LOW (ref 36.0–46.0)
Hemoglobin: 7.5 g/dL — ABNORMAL LOW (ref 12.0–15.0)
Potassium: 4.7 mmol/L (ref 3.5–5.1)
Sodium: 129 mmol/L — ABNORMAL LOW (ref 135–145)
TCO2: 26 mmol/L (ref 22–32)

## 2018-10-26 LAB — HCG, SERUM, QUALITATIVE: Preg, Serum: NEGATIVE

## 2018-10-26 SURGERY — ARTERIOVENOUS (AV) FISTULA CREATION
Anesthesia: Monitor Anesthesia Care | Site: Arm Upper | Laterality: Left

## 2018-10-26 MED ORDER — FENTANYL CITRATE (PF) 100 MCG/2ML IJ SOLN
25.0000 ug | INTRAMUSCULAR | Status: DC | PRN
  Administered 2018-10-26: 25 ug via INTRAVENOUS

## 2018-10-26 MED ORDER — LIDOCAINE 2% (20 MG/ML) 5 ML SYRINGE
INTRAMUSCULAR | Status: AC
  Filled 2018-10-26: qty 5

## 2018-10-26 MED ORDER — SODIUM CHLORIDE 0.9 % IV SOLN
INTRAVENOUS | Status: AC
  Filled 2018-10-26: qty 1.2

## 2018-10-26 MED ORDER — LIDOCAINE HCL 1 % IJ SOLN
INTRAMUSCULAR | Status: DC | PRN
  Administered 2018-10-26: 7 mL via INTRADERMAL

## 2018-10-26 MED ORDER — OXYCODONE HCL 5 MG PO TABS
5.0000 mg | ORAL_TABLET | Freq: Once | ORAL | Status: AC | PRN
  Administered 2018-10-26: 5 mg via ORAL

## 2018-10-26 MED ORDER — SODIUM CHLORIDE 0.9 % IV SOLN
INTRAVENOUS | Status: DC | PRN
  Administered 2018-10-26: 25 ug/min via INTRAVENOUS

## 2018-10-26 MED ORDER — HEPARIN SODIUM (PORCINE) 1000 UNIT/ML IJ SOLN
INTRAMUSCULAR | Status: AC
  Filled 2018-10-26: qty 1

## 2018-10-26 MED ORDER — INSULIN ASPART 100 UNIT/ML ~~LOC~~ SOLN
SUBCUTANEOUS | Status: AC
  Administered 2018-10-26: 10 [IU] via SUBCUTANEOUS
  Filled 2018-10-26: qty 1

## 2018-10-26 MED ORDER — OXYCODONE HCL 5 MG/5ML PO SOLN: 5.0000 mg | Freq: Once | ORAL | Status: AC | PRN

## 2018-10-26 MED ORDER — PROPOFOL 10 MG/ML IV BOLUS
INTRAVENOUS | Status: AC
  Filled 2018-10-26: qty 20

## 2018-10-26 MED ORDER — SODIUM CHLORIDE 0.9 % IV SOLN
INTRAVENOUS | Status: DC | PRN
  Administered 2018-10-26: 500 mL

## 2018-10-26 MED ORDER — MIDAZOLAM HCL 2 MG/2ML IJ SOLN
INTRAMUSCULAR | Status: AC
  Filled 2018-10-26: qty 2

## 2018-10-26 MED ORDER — PHENYLEPHRINE 40 MCG/ML (10ML) SYRINGE FOR IV PUSH (FOR BLOOD PRESSURE SUPPORT)
PREFILLED_SYRINGE | INTRAVENOUS | Status: AC
  Filled 2018-10-26: qty 10

## 2018-10-26 MED ORDER — 0.9 % SODIUM CHLORIDE (POUR BTL) OPTIME
TOPICAL | Status: DC | PRN
  Administered 2018-10-26: 1000 mL

## 2018-10-26 MED ORDER — INSULIN ASPART 100 UNIT/ML ~~LOC~~ SOLN
10.0000 [IU] | Freq: Once | SUBCUTANEOUS | Status: AC
  Administered 2018-10-26: 10 [IU] via SUBCUTANEOUS

## 2018-10-26 MED ORDER — CHLORHEXIDINE GLUCONATE 4 % EX LIQD: 60.0000 mL | Freq: Once | CUTANEOUS | Status: DC

## 2018-10-26 MED ORDER — FENTANYL CITRATE (PF) 250 MCG/5ML IJ SOLN
INTRAMUSCULAR | Status: AC
  Filled 2018-10-26: qty 5

## 2018-10-26 MED ORDER — FENTANYL CITRATE (PF) 100 MCG/2ML IJ SOLN
INTRAMUSCULAR | Status: AC
  Filled 2018-10-26: qty 2

## 2018-10-26 MED ORDER — PROPOFOL 1000 MG/100ML IV EMUL
INTRAVENOUS | Status: AC
  Filled 2018-10-26: qty 200

## 2018-10-26 MED ORDER — PHENYLEPHRINE 40 MCG/ML (10ML) SYRINGE FOR IV PUSH (FOR BLOOD PRESSURE SUPPORT)
PREFILLED_SYRINGE | INTRAVENOUS | Status: DC | PRN
  Administered 2018-10-26: 40 ug via INTRAVENOUS
  Administered 2018-10-26: 80 ug via INTRAVENOUS
  Administered 2018-10-26: 40 ug via INTRAVENOUS

## 2018-10-26 MED ORDER — OXYCODONE HCL 5 MG PO TABS
ORAL_TABLET | ORAL | Status: AC
  Filled 2018-10-26: qty 1

## 2018-10-26 MED ORDER — LIDOCAINE HCL (PF) 1 % IJ SOLN
INTRAMUSCULAR | Status: AC
  Filled 2018-10-26: qty 30

## 2018-10-26 MED ORDER — ONDANSETRON HCL 4 MG/2ML IJ SOLN
INTRAMUSCULAR | Status: AC
  Filled 2018-10-26: qty 2

## 2018-10-26 MED ORDER — CEFAZOLIN SODIUM-DEXTROSE 2-4 GM/100ML-% IV SOLN
2.0000 g | INTRAVENOUS | Status: AC
  Administered 2018-10-26: 2 g via INTRAVENOUS

## 2018-10-26 MED ORDER — OXYCODONE HCL 5 MG PO TABS: 5.0000 mg | ORAL_TABLET | Freq: Four times a day (QID) | ORAL | 0 refills | Status: DC | PRN

## 2018-10-26 MED ORDER — SODIUM CHLORIDE 0.9 % IV SOLN
INTRAVENOUS | Status: DC
  Administered 2018-10-26: 10:00:00 via INTRAVENOUS

## 2018-10-26 MED ORDER — LIDOCAINE HCL (CARDIAC) PF 100 MG/5ML IV SOSY
PREFILLED_SYRINGE | INTRAVENOUS | Status: DC | PRN
  Administered 2018-10-26: 40 mg via INTRAVENOUS

## 2018-10-26 MED ORDER — HEPARIN SODIUM (PORCINE) 1000 UNIT/ML IJ SOLN
INTRAMUSCULAR | Status: DC | PRN
  Administered 2018-10-26: 3000 [IU] via INTRAVENOUS

## 2018-10-26 MED ORDER — CEFAZOLIN SODIUM-DEXTROSE 2-4 GM/100ML-% IV SOLN
INTRAVENOUS | Status: AC
  Filled 2018-10-26: qty 100

## 2018-10-26 MED ORDER — MIDAZOLAM HCL 2 MG/2ML IJ SOLN
INTRAMUSCULAR | Status: DC | PRN
  Administered 2018-10-26: 2 mg via INTRAVENOUS

## 2018-10-26 MED ORDER — FENTANYL CITRATE (PF) 250 MCG/5ML IJ SOLN
INTRAMUSCULAR | Status: DC | PRN
  Administered 2018-10-26: 25 ug via INTRAVENOUS
  Administered 2018-10-26: 50 ug via INTRAVENOUS

## 2018-10-26 MED ORDER — ONDANSETRON HCL 4 MG/2ML IJ SOLN
INTRAMUSCULAR | Status: DC | PRN
  Administered 2018-10-26: 4 mg via INTRAVENOUS

## 2018-10-26 MED ORDER — ONDANSETRON HCL 4 MG/2ML IJ SOLN: 4.0000 mg | Freq: Once | INTRAMUSCULAR | Status: DC | PRN

## 2018-10-26 MED ORDER — PROPOFOL 1000 MG/100ML IV EMUL
INTRAVENOUS | Status: AC
  Filled 2018-10-26: qty 100

## 2018-10-26 MED ORDER — PROPOFOL 500 MG/50ML IV EMUL
INTRAVENOUS | Status: DC | PRN
  Administered 2018-10-26: 125 ug/kg/min via INTRAVENOUS

## 2018-10-26 SURGICAL SUPPLY — 35 items
ARMBAND PINK RESTRICT EXTREMIT (MISCELLANEOUS) ×2 IMPLANT
CANISTER SUCT 3000ML PPV (MISCELLANEOUS) ×2 IMPLANT
CLIP VESOCCLUDE MED 6/CT (CLIP) ×2 IMPLANT
CLIP VESOCCLUDE SM WIDE 6/CT (CLIP) ×2 IMPLANT
COVER PROBE W GEL 5X96 (DRAPES) ×2 IMPLANT
COVER WAND RF STERILE (DRAPES) IMPLANT
DECANTER SPIKE VIAL GLASS SM (MISCELLANEOUS) ×2 IMPLANT
DERMABOND ADHESIVE PROPEN (GAUZE/BANDAGES/DRESSINGS) ×1
DERMABOND ADVANCED (GAUZE/BANDAGES/DRESSINGS) ×1
DERMABOND ADVANCED .7 DNX12 (GAUZE/BANDAGES/DRESSINGS) ×1 IMPLANT
DERMABOND ADVANCED .7 DNX6 (GAUZE/BANDAGES/DRESSINGS) ×1 IMPLANT
ELECT REM PT RETURN 9FT ADLT (ELECTROSURGICAL) ×2
ELECTRODE REM PT RTRN 9FT ADLT (ELECTROSURGICAL) ×1 IMPLANT
GLOVE BIO SURGEON STRL SZ7.5 (GLOVE) ×2 IMPLANT
GLOVE BIOGEL PI IND STRL 8 (GLOVE) ×1 IMPLANT
GLOVE BIOGEL PI INDICATOR 8 (GLOVE) ×1
GOWN STRL REUS W/ TWL LRG LVL3 (GOWN DISPOSABLE) ×2 IMPLANT
GOWN STRL REUS W/ TWL XL LVL3 (GOWN DISPOSABLE) ×2 IMPLANT
GOWN STRL REUS W/TWL LRG LVL3 (GOWN DISPOSABLE) ×2
GOWN STRL REUS W/TWL XL LVL3 (GOWN DISPOSABLE) ×2
HEMOSTAT SPONGE AVITENE ULTRA (HEMOSTASIS) IMPLANT
KIT BASIN OR (CUSTOM PROCEDURE TRAY) ×2 IMPLANT
KIT TURNOVER KIT B (KITS) ×2 IMPLANT
LOOP VESSEL MINI RED (MISCELLANEOUS) ×2 IMPLANT
NS IRRIG 1000ML POUR BTL (IV SOLUTION) ×2 IMPLANT
PACK CV ACCESS (CUSTOM PROCEDURE TRAY) ×2 IMPLANT
PAD ARMBOARD 7.5X6 YLW CONV (MISCELLANEOUS) ×4 IMPLANT
SUT MNCRL AB 4-0 PS2 18 (SUTURE) ×2 IMPLANT
SUT PROLENE 6 0 BV (SUTURE) ×4 IMPLANT
SUT PROLENE 7 0 BV 1 (SUTURE) IMPLANT
SUT VIC AB 3-0 SH 27 (SUTURE) ×1
SUT VIC AB 3-0 SH 27X BRD (SUTURE) ×1 IMPLANT
TOWEL GREEN STERILE (TOWEL DISPOSABLE) ×2 IMPLANT
UNDERPAD 30X30 (UNDERPADS AND DIAPERS) ×2 IMPLANT
WATER STERILE IRR 1000ML POUR (IV SOLUTION) ×2 IMPLANT

## 2018-10-26 NOTE — Op Note (Signed)
OPERATIVE NOTE   PROCEDURE: 1. left first stage basilic vein transposition (brachiobasilic arteriovenous fistula) placement  PRE-OPERATIVE DIAGNOSIS: ESRD with failed bilateral upper extremity grafts  POST-OPERATIVE DIAGNOSIS: same  SURGEON: [name], MD  ASSISTANT(S): OR staff  ANESTHESIA: MAC  ESTIMATED BLOOD LOSS: Minimal  FINDING(S): 1.  Basilic vein: 2.5 mm, stenosis noted when passing dilator proximally after vein transected - able to pass #3 dilator after starting with #1.5 dilator 2.  Brachial artery: 3.0 mm, disease free 3.  Venous outflow: dopplerable thrill  4.  Radial flow: dopplerable radial signal  SPECIMEN(S):  none  INDICATIONS:   [name] is a 33 y.o. female who has failed bilateral upper extremity AV grafts and has undergone previous multiple graft revisions.  All of her upper arm AV grafts are thrombosed at this time.  She has refused any attempted femoral access.  We have tried to come up with some alternative option and she does have a marginal basilic vein in the left arm.  The patient is scheduled for left first stage basilic vein transposition.  The patient is aware the risks include but are not limited to: bleeding, infection, steal syndrome, nerve damage, ischemic monomelic neuropathy, failure to mature, and need for additional procedures.  The patient is aware of the risks of the procedure and elects to proceed forward.   DESCRIPTION: After full informed written consent was obtained from the patient, the patient was brought back to the operating room and placed supine upon the operating table.  Prior to induction, the patient received IV antibiotics.   After obtaining adequate anesthesia, the patient was then prepped and draped in the standard fashion for a left arm access procedure.  I turned my attention first to identifying the patient's basilic vein and brachial artery.    Using SonoSite guidance, the location of these vessels  were marked out on the skin.   At this point, I injected local anesthetic to obtain a field block of the antecubitum.  In total, I injected about 10 mL of 1% lidocaine without epinephrine.  I made a transverse incision at the level of the antecubitum just above her old AV graft and dissected through the subcutaneous tissue and fascia to gain exposure of the brachial artery.  This was noted to be 3.0 mm in diameter externally.  This was dissected out proximally and distally and controlled with vessel loops .  I then dissected out the basilic vein.  This was noted to be 2.5 mm in diameter externally.  The distal segment of the vein was ligated with a  2-0 silk, and the vein was transected.  The proximal segment was interrogated with serial dilators.  Initially met resistance and could only get a #15. Dilator to pass.  After serial dilations the vein accepted up to a #3 dilator without any difficulty.  I then instilled the heparinized saline into the vein and clamped it.  At this point, I reset my exposure of the brachial artery and placed the artery under tension proximally and distally.  I made an arteriotomy with a #11 blade, and then I extended the arteriotomy with a Potts scissor.  I injected heparinized saline proximal and distal to this arteriotomy.  The vein was then sewn to the artery in an end-to-side configuration with a running stitch of 6-0 Prolene.  Prior to completing this anastomosis, I allowed the vein and artery to backbleed.  There was no evidence of clot from any vessels.  I  completed the anastomosis in the usual fashion and then released all vessel loops and clamps.    There was a dopplerable thrill in the venous outflow, and there was a dopplerable radial signal.  At this point, I irrigated out the surgical wound.  There was no further active bleeding.  The subcutaneous tissue was reapproximated with a running stitch of 3-0 Vicryl.  The skin was then reapproximated with a running subcuticular  stitch of 4-0 Monocryl.  The skin was then cleaned, dried, and reinforced with Dermabond.  The patient tolerated this procedure well.   COMPLICATIONS: None  CONDITION: Stable   [name] MD [facility] Office: [phone] Pager: [phone]  10/26/2018, 2:03 PM
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
## 2018-10-26 NOTE — Discharge Instructions (Signed)
° °  [facility] ° °Discharge Instructions ° °AV Fistula or Graft Surgery for Dialysis Access ° °Please refer to the following instructions for your post-procedure care. Your surgeon or physician assistant will discuss any changes with you. ° °Activity ° °You may drive the day following your surgery, if you are comfortable and no longer taking prescription pain medication. Resume full activity as the soreness in your incision resolves. ° °Bathing/Showering ° °You may shower after you go home. Keep your incision dry for 48 hours. Do not soak in a bathtub, hot tub, or swim until the incision heals completely. You may not shower if you have a hemodialysis catheter. ° °Incision Care ° °Clean your incision with mild soap and water after 48 hours. Pat the area dry with a clean towel. You do not need a bandage unless otherwise instructed. Do not apply any ointments or creams to your incision. You may have skin glue on your incision. Do not peel it off. It will come off on its own in about one week. Your arm may swell a bit after surgery. To reduce swelling use pillows to elevate your arm so it is above your heart. Your doctor will tell you if you need to lightly wrap your arm with an ACE bandage. ° °Diet ° °Resume your normal diet. There are not special food restrictions following this procedure. In order to heal from your surgery, it is CRITICAL to get adequate nutrition. Your body requires vitamins, minerals, and protein. Vegetables are the best source of vitamins and minerals. Vegetables also provide the perfect balance of protein. Processed food has little nutritional value, so try to avoid this. ° °Medications ° °Resume taking all of your medications. If your incision is causing pain, you may take over-the counter pain relievers such as acetaminophen (Tylenol). If you were prescribed a stronger pain medication, please be aware these medications can cause nausea and constipation. Prevent  nausea by taking the medication with a snack or meal. Avoid constipation by drinking plenty of fluids and eating foods with high amount of fiber, such as fruits, vegetables, and grains. Do not take Tylenol if you are taking prescription pain medications. ° ° ° ° °Follow up °Your surgeon may want to see you in the office following your access surgery. If so, this will be arranged at the time of your surgery. ° °Please call us immediately for any of the following conditions: ° °Increased pain, redness, drainage (pus) from your incision site °Fever of 101 degrees or higher °Severe or worsening pain at your incision site °Hand pain or numbness. ° °Reduce your risk of vascular disease: ° °Stop smoking. If you would like help, call [facility] at 1-800-QUIT-NOW (1-800-784-8669) or [facility] at 336-586-4000 ° °Manage your cholesterol °Maintain a desired weight °Control your diabetes °Keep your blood pressure down ° °Dialysis ° °It will take several weeks to several months for your new dialysis access to be ready for use. Your surgeon will determine when it is OK to use it. Your nephrologist will continue to direct your dialysis. You can continue to use your Permcath until your new access is ready for use. ° °If you have any questions, please call the office at [phone]. ° °

## 2018-10-26 NOTE — Progress Notes (Signed)
Dr. Linna Caprice from anesthesia made aware of repeat CBG 331, after 10 units of Novolog insulin via sq.

## 2018-10-26 NOTE — Anesthesia Postprocedure Evaluation (Signed)
Anesthesia Post Note  Patient: [name]  Procedure(s) Performed: CREATION OF ARTERIOVENOUS FISTULA  LEFT ARM (Left Arm Upper)     Patient location during evaluation: PACU Anesthesia Type: MAC Level of consciousness: awake and alert Pain management: pain level controlled Vital Signs Assessment: post-procedure vital signs reviewed and stable Respiratory status: spontaneous breathing, nonlabored ventilation, respiratory function stable and patient connected to nasal cannula oxygen Cardiovascular status: blood pressure returned to baseline and stable Postop Assessment: no apparent nausea or vomiting Anesthetic complications: no    Last Vitals:  Vitals:   10/26/18 1445 10/26/18 1500  BP: (!) 104/57 (!) 101/59  Pulse: (!) 102 (!) 102  Resp: 14 13  Temp: 36.7 C   SpO2: 99% 97%    Last Pain:  Vitals:   10/26/18 1452  PainSc: 3                  Chase Arnall COKER

## 2018-10-26 NOTE — Transfer of Care (Signed)
Immediate Anesthesia Transfer of Care Note  Patient: [name]  Procedure(s) Performed: CREATION OF ARTERIOVENOUS FISTULA  LEFT ARM (Left Arm Upper)  Patient Location: PACU  Anesthesia Type:MAC  Level of Consciousness: awake, alert , oriented, drowsy and patient cooperative  Airway & Oxygen Therapy: Patient Spontanous Breathing and Patient connected to nasal cannula oxygen  Post-op Assessment: Report given to RN and Post -op Vital signs reviewed and stable  Post vital signs: Reviewed and stable  Last Vitals:  Vitals Value Taken Time  BP 90/53 10/26/18 1418  Temp    Pulse 100 10/26/18 1420  Resp 25 10/26/18 1420  SpO2 99 % 10/26/18 1420  Vitals shown include unvalidated device data.  Last Pain: There were no vitals filed for this visit.       Complications: No apparent anesthesia complications

## 2018-10-27 ENCOUNTER — Emergency Department (HOSPITAL_COMMUNITY): Payer: Medicare Other

## 2018-10-27 ENCOUNTER — Encounter (HOSPITAL_COMMUNITY): Payer: Self-pay | Admitting: Vascular Surgery

## 2018-10-27 ENCOUNTER — Inpatient Hospital Stay (HOSPITAL_COMMUNITY)
Admission: EM | Admit: 2018-10-27 | Discharge: 2018-10-31 | DRG: 856 | Disposition: A | Discharge status: Home / Self Care | Payer: Medicare Other | Attending: Internal Medicine | Admitting: Internal Medicine

## 2018-10-27 DIAGNOSIS — Z20828 Contact with and (suspected) exposure to other viral communicable diseases: Secondary | ICD-10-CM | POA: Diagnosis present

## 2018-10-27 DIAGNOSIS — Z992 Dependence on renal dialysis: Secondary | ICD-10-CM

## 2018-10-27 DIAGNOSIS — T8143XA Infection following a procedure, organ and space surgical site, initial encounter: Principal | ICD-10-CM | POA: Diagnosis present

## 2018-10-27 DIAGNOSIS — Z794 Long term (current) use of insulin: Secondary | ICD-10-CM

## 2018-10-27 DIAGNOSIS — Z91018 Allergy to other foods: Secondary | ICD-10-CM

## 2018-10-27 DIAGNOSIS — T8612 Kidney transplant failure: Secondary | ICD-10-CM | POA: Diagnosis present

## 2018-10-27 DIAGNOSIS — I959 Hypotension, unspecified: Secondary | ICD-10-CM | POA: Diagnosis not present

## 2018-10-27 DIAGNOSIS — Y830 Surgical operation with transplant of whole organ as the cause of abnormal reaction of the patient, or of later complication, without mention of misadventure at the time of the procedure: Secondary | ICD-10-CM | POA: Diagnosis present

## 2018-10-27 DIAGNOSIS — Z91013 Allergy to seafood: Secondary | ICD-10-CM

## 2018-10-27 DIAGNOSIS — Z905 Acquired absence of kidney: Secondary | ICD-10-CM

## 2018-10-27 DIAGNOSIS — Z6827 Body mass index (BMI) 27.0-27.9, adult: Secondary | ICD-10-CM

## 2018-10-27 DIAGNOSIS — K3533 Acute appendicitis with perforation and localized peritonitis, with abscess: Secondary | ICD-10-CM

## 2018-10-27 DIAGNOSIS — Z9049 Acquired absence of other specified parts of digestive tract: Secondary | ICD-10-CM

## 2018-10-27 DIAGNOSIS — Z8261 Family history of arthritis: Secondary | ICD-10-CM

## 2018-10-27 DIAGNOSIS — I10 Essential (primary) hypertension: Secondary | ICD-10-CM | POA: Diagnosis present

## 2018-10-27 DIAGNOSIS — Z8249 Family history of ischemic heart disease and other diseases of the circulatory system: Secondary | ICD-10-CM

## 2018-10-27 DIAGNOSIS — Y838 Other surgical procedures as the cause of abnormal reaction of the patient, or of later complication, without mention of misadventure at the time of the procedure: Secondary | ICD-10-CM | POA: Diagnosis present

## 2018-10-27 DIAGNOSIS — IMO0002 Reserved for concepts with insufficient information to code with codable children: Secondary | ICD-10-CM | POA: Diagnosis present

## 2018-10-27 DIAGNOSIS — X101XXA Contact with hot food, initial encounter: Secondary | ICD-10-CM | POA: Diagnosis present

## 2018-10-27 DIAGNOSIS — G894 Chronic pain syndrome: Secondary | ICD-10-CM | POA: Diagnosis present

## 2018-10-27 DIAGNOSIS — E669 Obesity, unspecified: Secondary | ICD-10-CM | POA: Diagnosis present

## 2018-10-27 DIAGNOSIS — N189 Chronic kidney disease, unspecified: Secondary | ICD-10-CM | POA: Diagnosis present

## 2018-10-27 DIAGNOSIS — K219 Gastro-esophageal reflux disease without esophagitis: Secondary | ICD-10-CM | POA: Diagnosis present

## 2018-10-27 DIAGNOSIS — Z888 Allergy status to other drugs, medicaments and biological substances status: Secondary | ICD-10-CM

## 2018-10-27 DIAGNOSIS — E1065 Type 1 diabetes mellitus with hyperglycemia: Secondary | ICD-10-CM | POA: Diagnosis present

## 2018-10-27 DIAGNOSIS — E1122 Type 2 diabetes mellitus with diabetic chronic kidney disease: Secondary | ICD-10-CM | POA: Diagnosis present

## 2018-10-27 DIAGNOSIS — E785 Hyperlipidemia, unspecified: Secondary | ICD-10-CM | POA: Diagnosis present

## 2018-10-27 DIAGNOSIS — Z7952 Long term (current) use of systemic steroids: Secondary | ICD-10-CM

## 2018-10-27 DIAGNOSIS — Z79899 Other long term (current) drug therapy: Secondary | ICD-10-CM

## 2018-10-27 DIAGNOSIS — G8929 Other chronic pain: Secondary | ICD-10-CM | POA: Diagnosis present

## 2018-10-27 DIAGNOSIS — Q970 Karyotype 47, XXX: Secondary | ICD-10-CM

## 2018-10-27 DIAGNOSIS — N186 End stage renal disease: Secondary | ICD-10-CM | POA: Diagnosis present

## 2018-10-27 DIAGNOSIS — T25112A Burn of first degree of left ankle, initial encounter: Secondary | ICD-10-CM | POA: Diagnosis present

## 2018-10-27 DIAGNOSIS — Z881 Allergy status to other antibiotic agents status: Secondary | ICD-10-CM

## 2018-10-27 DIAGNOSIS — K651 Peritoneal abscess: Secondary | ICD-10-CM | POA: Diagnosis present

## 2018-10-27 DIAGNOSIS — N2581 Secondary hyperparathyroidism of renal origin: Secondary | ICD-10-CM | POA: Diagnosis present

## 2018-10-27 DIAGNOSIS — E8889 Other specified metabolic disorders: Secondary | ICD-10-CM | POA: Diagnosis present

## 2018-10-27 DIAGNOSIS — Z833 Family history of diabetes mellitus: Secondary | ICD-10-CM

## 2018-10-27 DIAGNOSIS — I120 Hypertensive chronic kidney disease with stage 5 chronic kidney disease or end stage renal disease: Secondary | ICD-10-CM | POA: Diagnosis present

## 2018-10-27 DIAGNOSIS — D631 Anemia in chronic kidney disease: Secondary | ICD-10-CM | POA: Diagnosis present

## 2018-10-27 DIAGNOSIS — E109 Type 1 diabetes mellitus without complications: Secondary | ICD-10-CM | POA: Diagnosis present

## 2018-10-27 DIAGNOSIS — E1165 Type 2 diabetes mellitus with hyperglycemia: Secondary | ICD-10-CM | POA: Diagnosis present

## 2018-10-27 LAB — COMPREHENSIVE METABOLIC PANEL
ALT: 30 U/L (ref 0–44)
AST: 125 U/L — ABNORMAL HIGH (ref 15–41)
Albumin: 2.7 g/dL — ABNORMAL LOW (ref 3.5–5.0)
Alkaline Phosphatase: 108 U/L (ref 38–126)
Anion gap: 15 (ref 5–15)
BUN: 9 mg/dL (ref 6–20)
CO2: 25 mmol/L (ref 22–32)
Calcium: 9.9 mg/dL (ref 8.9–10.3)
Chloride: 96 mmol/L — ABNORMAL LOW (ref 98–111)
Creatinine, Ser: 3.69 mg/dL — ABNORMAL HIGH (ref 0.44–1.00)
GFR calc Af Amer: 17 mL/min — ABNORMAL LOW (ref >60)
GFR calc non Af Amer: 15 mL/min — ABNORMAL LOW (ref >60)
Glucose, Bld: 190 mg/dL — ABNORMAL HIGH (ref 70–99)
Potassium: 3.6 mmol/L (ref 3.5–5.1)
Sodium: 136 mmol/L (ref 135–145)
Total Bilirubin: 1 mg/dL (ref 0.3–1.2)
Total Protein: 7.2 g/dL (ref 6.5–8.1)

## 2018-10-27 LAB — CBC WITH DIFFERENTIAL/PLATELET
Abs Immature Granulocytes: 0.04 10*3/uL (ref 0.00–0.07)
Basophils Absolute: 0 10*3/uL (ref 0.0–0.1)
Basophils Relative: 1 %
Eosinophils Absolute: 0.2 10*3/uL (ref 0.0–0.5)
Eosinophils Relative: 3 %
HCT: 23.7 % — ABNORMAL LOW (ref 36.0–46.0)
Hemoglobin: 6.9 g/dL — CL (ref 12.0–15.0)
Immature Granulocytes: 1 %
Lymphocytes Relative: 27 %
Lymphs Abs: 1.6 10*3/uL (ref 0.7–4.0)
MCH: 26.2 pg (ref 26.0–34.0)
MCHC: 29.1 g/dL — ABNORMAL LOW (ref 30.0–36.0)
MCV: 90.1 fL (ref 80.0–100.0)
Monocytes Absolute: 0.7 10*3/uL (ref 0.1–1.0)
Monocytes Relative: 11 %
Neutro Abs: 3.4 10*3/uL (ref 1.7–7.7)
Neutrophils Relative %: 57 %
Platelets: 304 10*3/uL (ref 150–400)
RBC: 2.63 MIL/uL — ABNORMAL LOW (ref 3.87–5.11)
RDW: 16 % — ABNORMAL HIGH (ref 11.5–15.5)
WBC: 5.9 10*3/uL (ref 4.0–10.5)
nRBC: 0.5 % — ABNORMAL HIGH (ref 0.0–0.2)

## 2018-10-27 LAB — PREPARE RBC (CROSSMATCH)

## 2018-10-27 LAB — LACTIC ACID, PLASMA: Lactic Acid, Venous: 1.2 mmol/L (ref 0.5–1.9)

## 2018-10-27 MED ORDER — IOHEXOL 300 MG/ML  SOLN
100.0000 mL | Freq: Once | INTRAMUSCULAR | Status: AC | PRN
  Administered 2018-10-27: 21:00:00 100 mL via INTRAVENOUS

## 2018-10-27 MED ORDER — LORAZEPAM 1 MG PO TABS
1.0000 mg | ORAL_TABLET | Freq: Once | ORAL | Status: AC
  Administered 2018-10-27: 17:00:00 1 mg via ORAL
  Filled 2018-10-27: qty 1

## 2018-10-27 MED ORDER — SODIUM CHLORIDE 0.9% IV SOLUTION: Freq: Once | INTRAVENOUS | Status: DC

## 2018-10-27 MED ORDER — PIPERACILLIN-TAZOBACTAM 3.375 G IVPB 30 MIN
3.3750 g | Freq: Once | INTRAVENOUS | Status: AC
  Administered 2018-10-27: 3.375 g via INTRAVENOUS
  Filled 2018-10-27: qty 50

## 2018-10-27 NOTE — ED Triage Notes (Signed)
Patient arrived via [facility].   Patient was completing the last 15 minutes of her dialysis today that she "felt like she was going to have a seizure."   Patient claims that every time before she has a seizure her hands will start to shake and that they began shaking at dialysis. No episode of seizure. Last seizure was in September.
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
## 2018-10-27 NOTE — ED Notes (Signed)
Pt CBG 178. Notified Shawnette, Therapist, sports.

## 2018-10-28 ENCOUNTER — Inpatient Hospital Stay (HOSPITAL_COMMUNITY): Payer: Medicare Other

## 2018-10-28 ENCOUNTER — Other Ambulatory Visit: Payer: Self-pay

## 2018-10-28 ENCOUNTER — Encounter (HOSPITAL_COMMUNITY): Payer: Self-pay | Admitting: Diagnostic Radiology

## 2018-10-28 DIAGNOSIS — Z9049 Acquired absence of other specified parts of digestive tract: Secondary | ICD-10-CM | POA: Diagnosis not present

## 2018-10-28 DIAGNOSIS — K651 Peritoneal abscess: Secondary | ICD-10-CM | POA: Diagnosis present

## 2018-10-28 DIAGNOSIS — Z91018 Allergy to other foods: Secondary | ICD-10-CM | POA: Diagnosis not present

## 2018-10-28 DIAGNOSIS — Q970 Karyotype 47, XXX: Secondary | ICD-10-CM | POA: Diagnosis not present

## 2018-10-28 DIAGNOSIS — N186 End stage renal disease: Secondary | ICD-10-CM | POA: Diagnosis present

## 2018-10-28 DIAGNOSIS — Y838 Other surgical procedures as the cause of abnormal reaction of the patient, or of later complication, without mention of misadventure at the time of the procedure: Secondary | ICD-10-CM | POA: Diagnosis present

## 2018-10-28 DIAGNOSIS — Z20828 Contact with and (suspected) exposure to other viral communicable diseases: Secondary | ICD-10-CM | POA: Diagnosis present

## 2018-10-28 DIAGNOSIS — Z794 Long term (current) use of insulin: Secondary | ICD-10-CM | POA: Diagnosis not present

## 2018-10-28 DIAGNOSIS — E785 Hyperlipidemia, unspecified: Secondary | ICD-10-CM | POA: Diagnosis present

## 2018-10-28 DIAGNOSIS — E1165 Type 2 diabetes mellitus with hyperglycemia: Secondary | ICD-10-CM | POA: Diagnosis present

## 2018-10-28 DIAGNOSIS — Z881 Allergy status to other antibiotic agents status: Secondary | ICD-10-CM | POA: Diagnosis not present

## 2018-10-28 DIAGNOSIS — I120 Hypertensive chronic kidney disease with stage 5 chronic kidney disease or end stage renal disease: Secondary | ICD-10-CM | POA: Diagnosis present

## 2018-10-28 DIAGNOSIS — G894 Chronic pain syndrome: Secondary | ICD-10-CM | POA: Diagnosis present

## 2018-10-28 DIAGNOSIS — G8929 Other chronic pain: Secondary | ICD-10-CM | POA: Diagnosis not present

## 2018-10-28 DIAGNOSIS — Z7952 Long term (current) use of systemic steroids: Secondary | ICD-10-CM | POA: Diagnosis not present

## 2018-10-28 DIAGNOSIS — Y830 Surgical operation with transplant of whole organ as the cause of abnormal reaction of the patient, or of later complication, without mention of misadventure at the time of the procedure: Secondary | ICD-10-CM | POA: Diagnosis present

## 2018-10-28 DIAGNOSIS — Z888 Allergy status to other drugs, medicaments and biological substances status: Secondary | ICD-10-CM | POA: Diagnosis not present

## 2018-10-28 DIAGNOSIS — E1122 Type 2 diabetes mellitus with diabetic chronic kidney disease: Secondary | ICD-10-CM | POA: Diagnosis present

## 2018-10-28 DIAGNOSIS — Z8249 Family history of ischemic heart disease and other diseases of the circulatory system: Secondary | ICD-10-CM | POA: Diagnosis not present

## 2018-10-28 DIAGNOSIS — I10 Essential (primary) hypertension: Secondary | ICD-10-CM | POA: Diagnosis not present

## 2018-10-28 DIAGNOSIS — T8143XA Infection following a procedure, organ and space surgical site, initial encounter: Secondary | ICD-10-CM | POA: Diagnosis present

## 2018-10-28 DIAGNOSIS — T8612 Kidney transplant failure: Secondary | ICD-10-CM | POA: Diagnosis present

## 2018-10-28 DIAGNOSIS — Z8261 Family history of arthritis: Secondary | ICD-10-CM | POA: Diagnosis not present

## 2018-10-28 DIAGNOSIS — Z833 Family history of diabetes mellitus: Secondary | ICD-10-CM | POA: Diagnosis not present

## 2018-10-28 DIAGNOSIS — Z992 Dependence on renal dialysis: Secondary | ICD-10-CM | POA: Diagnosis not present

## 2018-10-28 DIAGNOSIS — N2581 Secondary hyperparathyroidism of renal origin: Secondary | ICD-10-CM | POA: Diagnosis present

## 2018-10-28 DIAGNOSIS — X101XXA Contact with hot food, initial encounter: Secondary | ICD-10-CM | POA: Diagnosis present

## 2018-10-28 DIAGNOSIS — Z91013 Allergy to seafood: Secondary | ICD-10-CM | POA: Diagnosis not present

## 2018-10-28 DIAGNOSIS — D631 Anemia in chronic kidney disease: Secondary | ICD-10-CM | POA: Diagnosis present

## 2018-10-28 HISTORY — PX: IR GUIDED DRAIN W CATHETER PLACEMENT: IMG719

## 2018-10-28 HISTORY — DX: Peritoneal abscess: K65.1

## 2018-10-28 LAB — GLUCOSE, CAPILLARY
Glucose-Capillary: 239 mg/dL — ABNORMAL HIGH (ref 70–99)
Glucose-Capillary: 199 mg/dL — ABNORMAL HIGH (ref 70–99)
Glucose-Capillary: 214 mg/dL — ABNORMAL HIGH (ref 70–99)
Glucose-Capillary: 334 mg/dL — ABNORMAL HIGH (ref 70–99)
Glucose-Capillary: 382 mg/dL — ABNORMAL HIGH (ref 70–99)

## 2018-10-28 LAB — COMPREHENSIVE METABOLIC PANEL
ALT: 37 U/L (ref 0–44)
AST: 178 U/L — ABNORMAL HIGH (ref 15–41)
Albumin: 2.5 g/dL — ABNORMAL LOW (ref 3.5–5.0)
Alkaline Phosphatase: 92 U/L (ref 38–126)
Anion gap: 15 (ref 5–15)
BUN: 13 mg/dL (ref 6–20)
CO2: 24 mmol/L (ref 22–32)
Calcium: 9.1 mg/dL (ref 8.9–10.3)
Chloride: 96 mmol/L — ABNORMAL LOW (ref 98–111)
Creatinine, Ser: 5.29 mg/dL — ABNORMAL HIGH (ref 0.44–1.00)
GFR calc Af Amer: 11 mL/min — ABNORMAL LOW (ref >60)
GFR calc non Af Amer: 10 mL/min — ABNORMAL LOW (ref >60)
Glucose, Bld: 230 mg/dL — ABNORMAL HIGH (ref 70–99)
Potassium: 3.7 mmol/L (ref 3.5–5.1)
Sodium: 135 mmol/L (ref 135–145)
Total Bilirubin: 1.2 mg/dL (ref 0.3–1.2)
Total Protein: 6.8 g/dL (ref 6.5–8.1)

## 2018-10-28 LAB — CBC
HCT: 25.2 % — ABNORMAL LOW (ref 36.0–46.0)
Hemoglobin: 7.6 g/dL — ABNORMAL LOW (ref 12.0–15.0)
MCH: 27.1 pg (ref 26.0–34.0)
MCHC: 30.2 g/dL (ref 30.0–36.0)
MCV: 90 fL (ref 80.0–100.0)
Platelets: 290 10*3/uL (ref 150–400)
RBC: 2.8 MIL/uL — ABNORMAL LOW (ref 3.87–5.11)
RDW: 15.6 % — ABNORMAL HIGH (ref 11.5–15.5)
WBC: 5.7 10*3/uL (ref 4.0–10.5)
nRBC: 0.9 % — ABNORMAL HIGH (ref 0.0–0.2)

## 2018-10-28 LAB — PROTIME-INR
INR: 1.3 — ABNORMAL HIGH (ref 0.8–1.2)
Prothrombin Time: 16.2 seconds — ABNORMAL HIGH (ref 11.4–15.2)

## 2018-10-28 LAB — MRSA PCR SCREENING: MRSA by PCR: NEGATIVE

## 2018-10-28 LAB — SARS CORONAVIRUS 2 BY RT PCR (HOSPITAL ORDER, PERFORMED IN ~~LOC~~ HOSPITAL LAB): SARS Coronavirus 2: NEGATIVE

## 2018-10-28 MED ORDER — ONDANSETRON HCL 4 MG PO TABS: 4.0000 mg | ORAL_TABLET | Freq: Four times a day (QID) | ORAL | Status: DC | PRN

## 2018-10-28 MED ORDER — MIDAZOLAM HCL 2 MG/2ML IJ SOLN
INTRAMUSCULAR | Status: AC | PRN
  Administered 2018-10-28 (×2): 0.5 mg via INTRAVENOUS

## 2018-10-28 MED ORDER — HEPARIN SODIUM (PORCINE) 5000 UNIT/ML IJ SOLN
5000.0000 [IU] | Freq: Three times a day (TID) | INTRAMUSCULAR | Status: DC
  Administered 2018-10-28 – 2018-10-31 (×8): 5000 [IU] via SUBCUTANEOUS
  Filled 2018-10-28 (×6): qty 1

## 2018-10-28 MED ORDER — HEPARIN SODIUM (PORCINE) 5000 UNIT/ML IJ SOLN: 5000.0000 [IU] | Freq: Three times a day (TID) | INTRAMUSCULAR | Status: DC

## 2018-10-28 MED ORDER — SODIUM CHLORIDE 0.9 % IV SOLN
500.0000 mg | Freq: Every day | INTRAVENOUS | Status: DC
  Administered 2018-10-29 – 2018-10-31 (×3): 500 mg via INTRAVENOUS
  Filled 2018-10-28 (×5): qty 0.5

## 2018-10-28 MED ORDER — LIDOCAINE HCL 1 % IJ SOLN
INTRAMUSCULAR | Status: AC
  Filled 2018-10-28: qty 20

## 2018-10-28 MED ORDER — PANTOPRAZOLE SODIUM 40 MG PO TBEC
40.0000 mg | DELAYED_RELEASE_TABLET | Freq: Two times a day (BID) | ORAL | Status: DC
  Administered 2018-10-28 – 2018-10-31 (×7): 40 mg via ORAL
  Filled 2018-10-28 (×7): qty 1

## 2018-10-28 MED ORDER — LORATADINE 10 MG PO TABS
10.0000 mg | ORAL_TABLET | Freq: Every day | ORAL | Status: DC
  Administered 2018-10-28 – 2018-10-31 (×4): 10 mg via ORAL
  Filled 2018-10-28 (×4): qty 1

## 2018-10-28 MED ORDER — GABAPENTIN 300 MG PO CAPS
300.0000 mg | ORAL_CAPSULE | Freq: Two times a day (BID) | ORAL | Status: DC
  Administered 2018-10-28 – 2018-10-31 (×7): 300 mg via ORAL
  Filled 2018-10-28 (×7): qty 1

## 2018-10-28 MED ORDER — FLUTICASONE PROPIONATE 50 MCG/ACT NA SUSP: 2.0000 | Freq: Every day | NASAL | Status: DC | PRN

## 2018-10-28 MED ORDER — CAMPHOR-MENTHOL 0.5-0.5 % EX LOTN: 1.0000 "application " | TOPICAL_LOTION | Freq: Three times a day (TID) | CUTANEOUS | Status: DC | PRN

## 2018-10-28 MED ORDER — ACETAMINOPHEN 500 MG PO TABS: 1000.0000 mg | ORAL_TABLET | Freq: Every day | ORAL | Status: DC | PRN

## 2018-10-28 MED ORDER — PROMETHAZINE HCL 25 MG PO TABS: 25.0000 mg | ORAL_TABLET | Freq: Four times a day (QID) | ORAL | Status: DC | PRN

## 2018-10-28 MED ORDER — DICYCLOMINE HCL 20 MG PO TABS
20.0000 mg | ORAL_TABLET | Freq: Two times a day (BID) | ORAL | Status: DC
  Administered 2018-10-28 – 2018-10-31 (×7): 20 mg via ORAL
  Filled 2018-10-28 (×7): qty 1

## 2018-10-28 MED ORDER — DOCUSATE SODIUM 283 MG RE ENEM
1.0000 | ENEMA | RECTAL | Status: DC | PRN
  Filled 2018-10-28: qty 1

## 2018-10-28 MED ORDER — VANCOMYCIN HCL 10 G IV SOLR
1750.0000 mg | Freq: Once | INTRAVENOUS | Status: AC
  Administered 2018-10-28: 1750 mg via INTRAVENOUS
  Filled 2018-10-28 (×2): qty 1750

## 2018-10-28 MED ORDER — VANCOMYCIN HCL IN DEXTROSE 750-5 MG/150ML-% IV SOLN
750.0000 mg | INTRAVENOUS | Status: DC
  Administered 2018-10-30: 750 mg via INTRAVENOUS
  Filled 2018-10-28: qty 150

## 2018-10-28 MED ORDER — PREDNISONE 10 MG PO TABS
10.0000 mg | ORAL_TABLET | Freq: Every day | ORAL | Status: DC
  Administered 2018-10-28: 10 mg via ORAL
  Filled 2018-10-28 (×4): qty 1

## 2018-10-28 MED ORDER — HYDROXYZINE HCL 25 MG PO TABS: 50.0000 mg | ORAL_TABLET | Freq: Two times a day (BID) | ORAL | Status: DC | PRN

## 2018-10-28 MED ORDER — INSULIN GLARGINE 100 UNIT/ML ~~LOC~~ SOLN
20.0000 [IU] | Freq: Every day | SUBCUTANEOUS | Status: DC
  Administered 2018-10-28 – 2018-10-29 (×2): 20 [IU] via SUBCUTANEOUS
  Filled 2018-10-28 (×2): qty 0.2

## 2018-10-28 MED ORDER — METOCLOPRAMIDE HCL 10 MG PO TABS
10.0000 mg | ORAL_TABLET | Freq: Three times a day (TID) | ORAL | Status: DC
  Administered 2018-10-28 – 2018-10-31 (×9): 10 mg via ORAL
  Filled 2018-10-28 (×9): qty 1

## 2018-10-28 MED ORDER — ACETAMINOPHEN 650 MG RE SUPP: 650.0000 mg | Freq: Four times a day (QID) | RECTAL | Status: DC | PRN

## 2018-10-28 MED ORDER — SODIUM CHLORIDE 0.9% FLUSH
5.0000 mL | Freq: Three times a day (TID) | INTRAVENOUS | Status: DC
  Administered 2018-10-28 – 2018-10-30 (×8): 5 mL

## 2018-10-28 MED ORDER — SORBITOL 70 % SOLN: 30.0000 mL | Status: DC | PRN

## 2018-10-28 MED ORDER — ZOLPIDEM TARTRATE 5 MG PO TABS: 5.0000 mg | ORAL_TABLET | Freq: Every evening | ORAL | Status: DC | PRN

## 2018-10-28 MED ORDER — OXYCODONE HCL 5 MG PO TABS
5.0000 mg | ORAL_TABLET | Freq: Four times a day (QID) | ORAL | Status: DC | PRN
  Administered 2018-10-30: 5 mg via ORAL
  Filled 2018-10-28: qty 1

## 2018-10-28 MED ORDER — FENTANYL CITRATE (PF) 100 MCG/2ML IJ SOLN
INTRAMUSCULAR | Status: AC
  Filled 2018-10-28: qty 2

## 2018-10-28 MED ORDER — FAMOTIDINE 20 MG PO TABS
40.0000 mg | ORAL_TABLET | Freq: Every day | ORAL | Status: DC
  Administered 2018-10-28 – 2018-10-29 (×2): 40 mg via ORAL
  Filled 2018-10-28 (×2): qty 2

## 2018-10-28 MED ORDER — LOPERAMIDE HCL 2 MG PO CAPS
2.0000 mg | ORAL_CAPSULE | ORAL | Status: DC | PRN
  Administered 2018-10-30: 2 mg via ORAL
  Filled 2018-10-28: qty 1

## 2018-10-28 MED ORDER — SEVELAMER CARBONATE 0.8 G PO PACK
1.6000 g | PACK | Freq: Three times a day (TID) | ORAL | Status: DC
  Administered 2018-10-28 – 2018-10-31 (×7): 1.6 g via ORAL
  Filled 2018-10-28 (×7): qty 2

## 2018-10-28 MED ORDER — INSULIN ASPART 100 UNIT/ML ~~LOC~~ SOLN
0.0000 [IU] | Freq: Three times a day (TID) | SUBCUTANEOUS | Status: DC
  Administered 2018-10-28: 7 [IU] via SUBCUTANEOUS
  Administered 2018-10-28: 3 [IU] via SUBCUTANEOUS
  Administered 2018-10-29 – 2018-10-30 (×2): 7 [IU] via SUBCUTANEOUS
  Administered 2018-10-30: 5 [IU] via SUBCUTANEOUS
  Administered 2018-10-31: 2 [IU] via SUBCUTANEOUS

## 2018-10-28 MED ORDER — AMLODIPINE BESYLATE 10 MG PO TABS
10.0000 mg | ORAL_TABLET | Freq: Every day | ORAL | Status: DC
  Administered 2018-10-28 – 2018-10-29 (×2): 10 mg via ORAL
  Filled 2018-10-28 (×4): qty 1

## 2018-10-28 MED ORDER — MIDAZOLAM HCL 2 MG/2ML IJ SOLN
INTRAMUSCULAR | Status: AC
  Filled 2018-10-28: qty 2

## 2018-10-28 MED ORDER — HYDROXYZINE HCL 25 MG PO TABS: 25.0000 mg | ORAL_TABLET | Freq: Three times a day (TID) | ORAL | Status: DC | PRN

## 2018-10-28 MED ORDER — FENTANYL CITRATE (PF) 100 MCG/2ML IJ SOLN
INTRAMUSCULAR | Status: AC | PRN
  Administered 2018-10-28: 25 ug via INTRAVENOUS
  Administered 2018-10-28: 50 ug via INTRAVENOUS

## 2018-10-28 MED ORDER — CALCIUM CARBONATE ANTACID 1250 MG/5ML PO SUSP: 500.0000 mg | Freq: Four times a day (QID) | ORAL | Status: DC | PRN

## 2018-10-28 MED ORDER — ONDANSETRON HCL 4 MG/2ML IJ SOLN: 4.0000 mg | Freq: Four times a day (QID) | INTRAMUSCULAR | Status: DC | PRN

## 2018-10-28 MED ORDER — NEPRO/CARBSTEADY PO LIQD: 237.0000 mL | Freq: Three times a day (TID) | ORAL | Status: DC | PRN

## 2018-10-28 MED ORDER — LIDOCAINE HCL (PF) 1 % IJ SOLN
INTRAMUSCULAR | Status: AC | PRN
  Administered 2018-10-28: 20 mL

## 2018-10-28 MED ORDER — INSULIN ASPART 100 UNIT/ML ~~LOC~~ SOLN
0.0000 [IU] | Freq: Every day | SUBCUTANEOUS | Status: DC
  Administered 2018-10-28: 5 [IU] via SUBCUTANEOUS

## 2018-10-28 MED ORDER — RENA-VITE PO TABS
1.0000 | ORAL_TABLET | Freq: Every day | ORAL | Status: DC
  Administered 2018-10-28 – 2018-10-30 (×3): 1 via ORAL
  Filled 2018-10-28 (×3): qty 1

## 2018-10-28 MED ORDER — ACETAMINOPHEN 325 MG PO TABS: 650.0000 mg | ORAL_TABLET | Freq: Four times a day (QID) | ORAL | Status: DC | PRN

## 2018-10-28 MED ORDER — SIMVASTATIN 20 MG PO TABS
20.0000 mg | ORAL_TABLET | Freq: Every day | ORAL | Status: DC
  Administered 2018-10-28 – 2018-10-30 (×3): 20 mg via ORAL
  Filled 2018-10-28 (×3): qty 1

## 2018-10-28 NOTE — Progress Notes (Signed)
New Admission Note:   Arrival Method: from ED via stretcher  Mental Orientation: A&Ox4 Telemetry: NOne Assessment: refer to flowsheet Skin: refer to flowsheet IV: RFA, saline locked Pain: 4/10 Tubes: None Safety Measures: Safety Fall Prevention Plan has been discussed  Admission: to be completed 5 [place] Orientation: Patient has been orientated to the room, unit and staff.   Family: none at bedside Orders to be reviewed and implemented. Will continue to monitor the patient. Call light has been placed within reach and bed alarm has been activated.

## 2018-10-28 NOTE — Consult Note (Signed)
Reason for Consult:  Intra-abdominal abscess Referring Physician: [name] is an 33 y.o. female.  HPI: This is a 33 year old female with ESRD who has a recently failed renal transplant in [place].  Within the last month, she had her failed transplant removed from her right iliac fossa.  She presented last night with abdominal pain.  She was found to have an abscess in the site of the her failed renal transplant.  She underwent an upper extremity access procedure 10/26/18.  Past Medical History:  Diagnosis Date  . Chronic kidney disease    kidney transplant 07  . Diabetes mellitus    Pt reports diagnosis in June 2011, Type 2  . GERD (gastroesophageal reflux disease)   . Hyperlipidemia   . Hypertension   . 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 Pyelonephritis 06/23/2014  . Seasonal allergies   . UTI (urinary tract infection) 01/09/2015  . 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 ARTERIOVENOUS GRAFT PLACEMENT Bilateral    "neither work" (10/24/2017)  . AV FISTULA PLACEMENT Left 10/26/2018   Procedure: CREATION OF ARTERIOVENOUS FISTULA  LEFT ARM;  Surgeon: [name], MD;  Location: [facility];  Service: Vascular;  Laterality: Left;  . CHOLECYSTECTOMY N/A 06/30/2017   Procedure: LAPAROSCOPIC CHOLECYSTECTOMY WITH INTRAOPERATIVE CHOLANGIOGRAM;  Surgeon: Excell Seltzer, MD;  Location: [facility];  Service: General;  Laterality: N/A;  . ESOPHAGOGASTRODUODENOSCOPY (EGD) WITH PROPOFOL N/A 07/04/2017   Procedure: ESOPHAGOGASTRODUODENOSCOPY (EGD) WITH PROPOFOL;  Surgeon: Clarene Essex, MD;  Location: [facility];  Service: Endoscopy;  Laterality: N/A;  . INSERTION OF DIALYSIS CATHETER N/A 03/20/2018   Procedure: INSERTION OF TUNNELED DIALYSIS CATHETER - RIGHT INTERANL  JUGULAR PLACEMENT;  Surgeon: Angelia Mould, MD;  Location: [facility];  Service: Vascular;  Laterality: N/A;  . KIDNEY TRANSPLANT  2007  . PARATHYROIDECTOMY  ?2012   "3/4 removed" (10/24/2017)  . RENAL BIOPSY Bilateral 2003  . UPPER EXTREMITY VENOGRAPHY Bilateral 10/19/2018   Procedure: UPPER EXTREMITY VENOGRAPHY;  Surgeon: [name], MD;  Location: [facility];  Service: Cardiovascular;  Laterality: Bilateral;  Bilateral     Family History  Problem Relation Age of Onset  . Arthritis Mother   . Diabetes Mother   . Hypertension Mother     Social History:  reports that she has never smoked. She has never used smokeless tobacco. She reports that she does not drink alcohol or use drugs.  Allergies:  Allergies  Allergen Reactions  . Benadryl [Diphenhydramine-Zinc Acetate] Shortness Of Breath  . Diphenhydramine Hcl Anaphylaxis    REACTION: Stopped breathing in ICU  . Doxycycline Shortness Of Breath  . Motrin [Ibuprofen] Shortness Of Breath and Itching    Per pt  . Banana Other (See Comments)    Sick on the stomach  . Iron Dextran Other (See Comments)    REACTION: vein irritation  . Shellfish Allergy Hives    Medications:  Prior to Admission medications   Medication Sig Start Date End Date Taking? Authorizing Provider  acetaminophen (TYLENOL) 500 MG tablet Take 1,000 mg by mouth daily as needed for moderate pain.    Yes [provider]  amLODipine (NORVASC) 10 MG tablet Take 1 tablet (10 mg total) by mouth  daily. 03/25/18  Yes Thurnell Lose, MD  b complex-vitamin c-folic acid (NEPHRO-VITE) 0.8 MG TABS tablet Take 1 tablet by mouth every Monday, Wednesday, and Friday. After dialysis on dialysis days 07/19/18  Yes [provider]  cetirizine (ZYRTEC) 10 MG tablet Take 10 mg by mouth daily as needed for allergies.   Yes [provider]  dicyclomine (BENTYL) 20 MG tablet Take 1 tablet (20 mg total) by mouth 2 (two) times daily. 02/06/18   Yes [name], Ripudeep K, MD  famotidine (PEPCID) 40 MG tablet Take 40 mg by mouth daily.    Yes [provider]  fluticasone (FLONASE) 50 MCG/ACT nasal spray Place 2 sprays into both nostrils daily as needed for allergies. 09/03/18  Yes [name], MD  gabapentin (NEURONTIN) 300 MG capsule Take 300 mg by mouth 2 (two) times daily.    Yes [provider]  hydrOXYzine (ATARAX/VISTARIL) 50 MG tablet Take 1 tablet (50 mg total) by mouth 2 (two) times daily as needed for itching. 02/06/18  Yes [name], Ripudeep K, MD  insulin aspart (NOVOLOG FLEXPEN) 100 UNIT/ML FlexPen Inject 12-15 Units into the skin See admin instructions. 5-6 times per day as needed for blood sugar management   Yes [provider]  insulin degludec (TRESIBA) 100 UNIT/ML SOPN FlexTouch Pen Inject 0.6 mLs (60 Units total) into the skin 2 (two) times daily. Reports taking 24 units QAM Patient taking differently: Inject 20 Units into the skin daily.  03/06/18  Yes Charlynne Cousins, MD  loperamide (IMODIUM) 2 MG capsule Take 2 mg by mouth as needed for diarrhea or loose stools.    Yes [provider]  metoCLOPramide (REGLAN) 10 MG tablet Take 1 tablet (10 mg total) by mouth 3 (three) times daily before meals. 02/06/18  Yes [name], Ripudeep K, MD  oxyCODONE (ROXICODONE) 5 MG immediate release tablet Take 1 tablet (5 mg total) by mouth every 6 (six) hours as needed for severe pain. 10/26/18  Yes [name], PA-C  pantoprazole (PROTONIX) 40 MG tablet Take 40 mg by mouth 2 (two) times daily.   Yes [provider]  predniSONE (DELTASONE) 10 MG tablet Take 1 tablet (10 mg total) by mouth every morning. 03/06/18  Yes Charlynne Cousins, MD  promethazine (PHENERGAN) 25 MG tablet Take 1 tablet (25 mg total) by mouth every 6 (six) hours as needed for nausea or vomiting. 02/06/18  Yes [name], Ripudeep K, MD  sevelamer carbonate (RENVELA) 0.8 g PACK packet Take 2 packets by mouth 3 (three) times daily with  meals. 10/09/18  Yes [provider]  simvastatin (ZOCOR) 20 MG tablet Take 20 mg by mouth at bedtime.    Yes [provider]  ACCU-CHEK SOFTCLIX LANCETS lancets Use to check blood sugar 4 times per day. 12/29/15   Renato Shin, MD     Results for orders placed or performed during the hospital encounter of 10/27/18 (from the past 48 hour(s))  Comprehensive metabolic panel     Status: Abnormal   Collection Time: 10/27/18  6:40 PM  Result Value Ref Range   Sodium 136 135 - 145 mmol/L   Potassium 3.6 3.5 - 5.1 mmol/L   Chloride 96 (L) 98 - 111 mmol/L   CO2 25 22 - 32 mmol/L   Glucose, Bld 190 (H) 70 - 99 mg/dL   BUN 9 6 - 20 mg/dL   Creatinine, Ser 3.69 (H) 0.44 - 1.00 mg/dL   Calcium 9.9 8.9 - 10.3 mg/dL   Total Protein  7.2 6.5 - 8.1 g/dL   Albumin 2.7 (L) 3.5 - 5.0 g/dL   AST 125 (H) 15 - 41 U/L   ALT 30 0 - 44 U/L   Alkaline Phosphatase 108 38 - 126 U/L   Total Bilirubin 1.0 0.3 - 1.2 mg/dL   GFR calc non Af Amer 15 (L) >60 mL/min   GFR calc Af Amer 17 (L) >60 mL/min   Anion gap 15 5 - 15    Comment: Performed at [facility] [address], [place], [place] [address]  CBC with Differential     Status: Abnormal   Collection Time: 10/27/18  6:40 PM  Result Value Ref Range   WBC 5.9 4.0 - 10.5 K/uL   RBC 2.63 (L) 3.87 - 5.11 MIL/uL   Hemoglobin 6.9 (LL) 12.0 - 15.0 g/dL    Comment: REPEATED TO VERIFY THIS CRITICAL RESULT HAS VERIFIED AND BEEN CALLED TO S CRUZ,RN BY WALTER BOND ON 08 14 2020 AT 1914, AND HAS BEEN READ BACK.     HCT 23.7 (L) 36.0 - 46.0 %   MCV 90.1 80.0 - 100.0 fL   MCH 26.2 26.0 - 34.0 pg   MCHC 29.1 (L) 30.0 - 36.0 g/dL   RDW 16.0 (H) 11.5 - 15.5 %   Platelets 304 150 - 400 K/uL   nRBC 0.5 (H) 0.0 - 0.2 %   Neutrophils Relative % 57 %   Neutro Abs 3.4 1.7 - 7.7 K/uL   Lymphocytes Relative 27 %   Lymphs Abs 1.6 0.7 - 4.0 K/uL   Monocytes Relative 11 %   Monocytes Absolute 0.7 0.1 - 1.0 K/uL   Eosinophils Relative 3 %    Eosinophils Absolute 0.2 0.0 - 0.5 K/uL   Basophils Relative 1 %   Basophils Absolute 0.0 0.0 - 0.1 K/uL   Immature Granulocytes 1 %   Abs Immature Granulocytes 0.04 0.00 - 0.07 K/uL    Comment: Performed at [facility] [address], [place], [place] [address]  Lactic acid, plasma     Status: None   Collection Time: 10/27/18  6:40 PM  Result Value Ref Range   Lactic Acid, Venous 1.2 0.5 - 1.9 mmol/L    Comment: Performed at [facility] [address], [place], [place] [address]  Prepare RBC     Status: None   Collection Time: 10/27/18 10:50 PM  Result Value Ref Range   Order Confirmation      ORDER PROCESSED BY BLOOD BANK Performed at [facility], [place] [address], [place], [place] [address]   Type and screen [place]     Status: None (Preliminary result)   Collection Time: 10/27/18 10:50 PM  Result Value Ref Range   ABO/RH(D) A POS    Antibody Screen NEG    Sample Expiration 10/30/2018,2359    Unit Number [id number]    Blood Component Type RED CELLS,LR    Unit division 00    Status of Unit ISSUED    Transfusion Status OK TO TRANSFUSE    Crossmatch Result      Compatible Performed at [facility], [place] [address], [place], [place] [address]     Dg Chest 2 View  Result Date: 10/27/2018 CLINICAL DATA:  Patient felt a seizure developing during dialysis today. EXAM: CHEST - 2 VIEW COMPARISON:  Chest x-rays dated 09/01/2018 and 05/01/2018. FINDINGS: Stable cardiomegaly. Overall cardiomediastinal silhouette is stable. Dialysis catheter appears stable in  position with tip overlying the RIGHT atrium. Stable streaky opacities at the RIGHT lung base, likely atelectasis. No new lung findings. No pleural effusion or pneumothorax seen. Osseous structures about the chest are unremarkable. IMPRESSION: No active cardiopulmonary disease. Stable cardiomegaly. Stable atelectasis at the RIGHT lung base. No evidence of pneumonia or pulmonary edema.  Electronically Signed   By: Franki Cabot M.D.   On: 10/27/2018 19:21   Ct Abdomen Pelvis W Contrast  Result Date: 10/27/2018 CLINICAL DATA:  Acute generalized abdominal pain. 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 EXAM: CT ABDOMEN AND PELVIS WITH CONTRAST TECHNIQUE: Multidetector CT imaging of the abdomen and pelvis was performed using the standard protocol following bolus administration of intravenous contrast. CONTRAST:  118mL OMNIPAQUE IOHEXOL 300 MG/ML  SOLN COMPARISON:  09/17/2018, additional priors. FINDINGS: Lower chest: Central catheter tip at the atrial caval junction. Increased lung base atelectasis from prior exam. Hepatobiliary: No focal liver abnormality is seen. Status post cholecystectomy. No biliary dilatation. Pancreas: No ductal dilatation or inflammation. Spleen: Upper normal in size without focal abnormality. Adrenals/Urinary Tract: Normal adrenal glands. Chronically atrophic left native kidney. The right kidney not visualized. In the location of the previous right iliac fossa renal transplant is a heterogeneous air-fluid collection with peripheral enhancement measuring 11.3 x 5.9 x 6.4 cm. Urinary bladder is nondistended and not well evaluated. Stomach/Bowel: Bowel evaluation is limited in the absence of enteric contrast. Stomach is decompressed. No evidence of bowel obstruction, wall thickening or inflammation. Appendix tentatively identified and normal. Small volume of stool in the colon. Vascular/Lymphatic: Normal caliber abdominal aorta. The portal vein is patent. There multiple prominent retroperitoneal lymph nodes that are similar to prior exam. Enlarged right external iliac node measuring 13 mm short axis is adjacent to prior renal transplant. Additional prominent lymph nodes in the right inguinal region. Reproductive: Uterus not visualized.  No adnexal masses. Other: Recent midline and right lower abdominal wound. Mild edema and minimal scattered fluid  along the incision site, but no drainable subcutaneous fluid collection. Trace perihepatic free fluid. No free air. Musculoskeletal: No acute osseous abnormalities. Suggestion of mild renal osteodystrophy. IMPRESSION: 1. Heterogeneous air-fluid collection in the location of the previous right iliac fossa renal transplant measuring 11.3 x 5.9 x 6.4 cm, suspicious for abscess. 2. Enlarged right external iliac and right inguinal lymph nodes are likely reactive. 3. Recent midline and right lower abdominal surgical wound. Mild edema and minimal scattered fluid along the incision site, but no drainable subcutaneous fluid collection. Electronically Signed   By: Keith Rake M.D.   On: 10/27/2018 23:06    Review of Systems  Constitutional: Negative for weight loss.  HENT: Negative for ear discharge, ear pain, hearing loss and tinnitus.   Eyes: Negative for blurred vision, double vision, photophobia and pain.  Respiratory: Negative for cough, sputum production and shortness of breath.   Cardiovascular: Negative for chest pain.  Gastrointestinal: Positive for abdominal pain. Negative for nausea and vomiting.  Genitourinary: Negative for dysuria, flank pain, frequency and urgency.  Musculoskeletal: Negative for back pain, falls, joint pain, myalgias and neck pain.  Neurological: Positive for weakness. Negative for dizziness, tingling, sensory change, focal weakness, loss of consciousness and headaches.  Endo/Heme/Allergies: Does not bruise/bleed easily.  Psychiatric/Behavioral: Negative for depression, memory loss and substance abuse. The patient is not nervous/anxious.    Blood pressure 98/75, pulse (!) 116, temperature 100.1 F (37.8 C), temperature source Oral, resp. rate 18, SpO2 95 %. Physical Exam WDWN in NAD Eyes:  Pupils equal, round; sclera anicteric HENT:  Oral mucosa moist; good dentition  Neck:  No masses palpated, no thyromegaly Lungs:  CTA bilaterally; normal respiratory effort CV:   Tachycardic; normal rhythm; no murmurs; extremities well-perfused with no edema Abd:  +bowel sounds, soft, tender in right pelvis/ RLQ; no palpable organomegaly; no palpable hernias Skin:  Warm, dry; no sign of jaundice Psychiatric - alert and oriented x 4; calm mood and affect  Assessment/Plan: Abscess in right iliac fossa at the site of recent explantation of failed renal transplant - [facility]  Recs:  NPO IR consult for percutaneous drainage of abscess in AM.   No indications for acute surgical intervention.  Imogene Burn [name] 10/28/2018, 2:03 AM

## 2018-10-28 NOTE — Progress Notes (Signed)
Referring Physician(s): Tsuei,M  Supervising Physician: Markus Daft  Patient Status:  [facility] ([facility] - In-pt  Chief Complaint:  Abdominal pain  Subjective: Pt familiar to IR service for multiple peritoneal drain placements in 2001 as well as hemodialysis catheter placement in 2001.  She has a history of end-stage renal disease, diabetes, GERD, hypertension, hyperlipidemia, XXX syndrome and is status post recent removal of a failed renal transplant from the right iliac fossa region at [facility].  She now presents with abdominal pain, fever and CT findings of an air-fluid collection in the location of the previously transplanted kidney in the right iliac fossa suspicious for abscess.  Request now received from surgery for drainage of the abscess.   Allergies: Benadryl [diphenhydramine-zinc acetate], Diphenhydramine hcl, Doxycycline, Motrin [ibuprofen], Banana, Iron dextran, and Shellfish allergy  Medications: Prior to Admission medications   Medication Sig Start Date End Date Taking? Authorizing Provider  acetaminophen (TYLENOL) 500 MG tablet Take 1,000 mg by mouth daily as needed for moderate pain.    Yes [provider]  amLODipine (NORVASC) 10 MG tablet Take 1 tablet (10 mg total) by mouth daily. 03/25/18  Yes Thurnell Lose, MD  b complex-vitamin c-folic acid (NEPHRO-VITE) 0.8 MG TABS tablet Take 1 tablet by mouth every Monday, Wednesday, and Friday. After dialysis on dialysis days 07/19/18  Yes [provider]  cetirizine (ZYRTEC) 10 MG tablet Take 10 mg by mouth daily as needed for allergies.   Yes [provider]  dicyclomine (BENTYL) 20 MG tablet Take 1 tablet (20 mg total) by mouth 2 (two) times daily. 02/06/18  Yes [name], Ripudeep K, MD  famotidine (PEPCID) 40 MG tablet Take 40 mg by mouth daily.    Yes [provider]  fluticasone (FLONASE) 50 MCG/ACT nasal spray Place 2 sprays into both nostrils daily as needed for allergies. 09/03/18  Yes [name], MD   gabapentin (NEURONTIN) 300 MG capsule Take 300 mg by mouth 2 (two) times daily.    Yes [provider]  hydrOXYzine (ATARAX/VISTARIL) 50 MG tablet Take 1 tablet (50 mg total) by mouth 2 (two) times daily as needed for itching. 02/06/18  Yes [name], Ripudeep K, MD  insulin aspart (NOVOLOG FLEXPEN) 100 UNIT/ML FlexPen Inject 12-15 Units into the skin See admin instructions. 5-6 times per day as needed for blood sugar management   Yes [provider]  insulin degludec (TRESIBA) 100 UNIT/ML SOPN FlexTouch Pen Inject 0.6 mLs (60 Units total) into the skin 2 (two) times daily. Reports taking 24 units QAM Patient taking differently: Inject 20 Units into the skin daily.  03/06/18  Yes Charlynne Cousins, MD  loperamide (IMODIUM) 2 MG capsule Take 2 mg by mouth as needed for diarrhea or loose stools.    Yes [provider]  metoCLOPramide (REGLAN) 10 MG tablet Take 1 tablet (10 mg total) by mouth 3 (three) times daily before meals. 02/06/18  Yes [name], Ripudeep K, MD  oxyCODONE (ROXICODONE) 5 MG immediate release tablet Take 1 tablet (5 mg total) by mouth every 6 (six) hours as needed for severe pain. 10/26/18  Yes [name], PA-C  pantoprazole (PROTONIX) 40 MG tablet Take 40 mg by mouth 2 (two) times daily.   Yes [provider]  predniSONE (DELTASONE) 10 MG tablet Take 1 tablet (10 mg total) by mouth every morning. 03/06/18  Yes Charlynne Cousins, MD  promethazine (PHENERGAN) 25 MG tablet Take 1 tablet (25 mg total) by mouth every 6 (six) hours as needed for  nausea or vomiting. 02/06/18  Yes [name], Ripudeep K, MD  sevelamer carbonate (RENVELA) 0.8 g PACK packet Take 2 packets by mouth 3 (three) times daily with meals. 10/09/18  Yes [provider]  simvastatin (ZOCOR) 20 MG tablet Take 20 mg by mouth at bedtime.    Yes [provider]  ACCU-CHEK SOFTCLIX LANCETS lancets Use to check blood sugar 4 times per day. 12/29/15   Renato Shin, MD     Vital  Signs: BP 109/78 (BP Location: Right Arm)   Pulse (!) 102   Temp 98.9 F (37.2 C) (Oral)   Resp 18   LMP  (LMP Unknown) Comment: pt shielded  SpO2 91%   Physical Exam patient lethargic but arousable; chest with slightly diminished breath sounds bases.  Heart with tachycardic but regular rhythm.,  Abd soft,  positive bowel sounds, tender right lower quadrant and mid pelvic regions  to palpation; no lower extremity edema  Imaging: Dg Chest 2 View  Result Date: 10/27/2018 CLINICAL DATA:  Patient felt a seizure developing during dialysis today. EXAM: CHEST - 2 VIEW COMPARISON:  Chest x-rays dated 09/01/2018 and 05/01/2018. FINDINGS: Stable cardiomegaly. Overall cardiomediastinal silhouette is stable. Dialysis catheter appears stable in position with tip overlying the RIGHT atrium. Stable streaky opacities at the RIGHT lung base, likely atelectasis. No new lung findings. No pleural effusion or pneumothorax seen. Osseous structures about the chest are unremarkable. IMPRESSION: No active cardiopulmonary disease. Stable cardiomegaly. Stable atelectasis at the RIGHT lung base. No evidence of pneumonia or pulmonary edema. Electronically Signed   By: Franki Cabot M.D.   On: 10/27/2018 19:21   Ct Abdomen Pelvis W Contrast  Result Date: 10/27/2018 CLINICAL DATA:  Acute generalized abdominal pain. 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 EXAM: CT ABDOMEN AND PELVIS WITH CONTRAST TECHNIQUE: Multidetector CT imaging of the abdomen and pelvis was performed using the standard protocol following bolus administration of intravenous contrast. CONTRAST:  139mL OMNIPAQUE IOHEXOL 300 MG/ML  SOLN COMPARISON:  09/17/2018, additional priors. FINDINGS: Lower chest: Central catheter tip at the atrial caval junction. Increased lung base atelectasis from prior exam. Hepatobiliary: No focal liver abnormality is seen. Status post cholecystectomy. No biliary dilatation. Pancreas: No ductal dilatation or  inflammation. Spleen: Upper normal in size without focal abnormality. Adrenals/Urinary Tract: Normal adrenal glands. Chronically atrophic left native kidney. The right kidney not visualized. In the location of the previous right iliac fossa renal transplant is a heterogeneous air-fluid collection with peripheral enhancement measuring 11.3 x 5.9 x 6.4 cm. Urinary bladder is nondistended and not well evaluated. Stomach/Bowel: Bowel evaluation is limited in the absence of enteric contrast. Stomach is decompressed. No evidence of bowel obstruction, wall thickening or inflammation. Appendix tentatively identified and normal. Small volume of stool in the colon. Vascular/Lymphatic: Normal caliber abdominal aorta. The portal vein is patent. There multiple prominent retroperitoneal lymph nodes that are similar to prior exam. Enlarged right external iliac node measuring 13 mm short axis is adjacent to prior renal transplant. Additional prominent lymph nodes in the right inguinal region. Reproductive: Uterus not visualized.  No adnexal masses. Other: Recent midline and right lower abdominal wound. Mild edema and minimal scattered fluid along the incision site, but no drainable subcutaneous fluid collection. Trace perihepatic free fluid. No free air. Musculoskeletal: No acute osseous abnormalities. Suggestion of mild renal osteodystrophy. IMPRESSION: 1. Heterogeneous air-fluid collection in the location of the previous right iliac fossa renal transplant measuring 11.3 x 5.9 x 6.4 cm, suspicious for abscess. 2. Enlarged  right external iliac and right inguinal lymph nodes are likely reactive. 3. Recent midline and right lower abdominal surgical wound. Mild edema and minimal scattered fluid along the incision site, but no drainable subcutaneous fluid collection. Electronically Signed   By: Keith Rake M.D.   On: 10/27/2018 23:06    Labs:  CBC: Recent Labs    09/17/18 0410 09/29/18 1714 10/19/18 0812 10/26/18 1036  10/27/18 1840 10/28/18 0635  WBC 12.3* 9.5  --   --  5.9 5.7  HGB 8.5* 9.3* 8.8* 7.5* 6.9* 7.6*  HCT 28.5* 30.8* 26.0* 22.0* 23.7* 25.2*  PLT 269 324  --   --  304 290    COAGS: Recent Labs    10/28/18 0919  INR 1.3*    BMP: Recent Labs    09/17/18 0410 09/29/18 1714 10/19/18 0812 10/26/18 1036 10/27/18 1840 10/28/18 0635  NA 133* 133* 134* 129* 136 135  K 4.0 3.5 4.4 4.7 3.6 3.7  CL 98 98 97* 94* 96* 96*  CO2 20* 22  --   --  25 24  GLUCOSE 239* 195* 296* 365* 190* 230*  BUN 52* 22* 39* 36* 9 13  CALCIUM 7.5* 9.6  --   --  9.9 9.1  CREATININE 7.54* 5.48* 6.30* 6.70* 3.69* 5.29*  GFRNONAA 6* 9*  --   --  15* 10*  GFRAA 7* 11*  --   --  17* 11*    LIVER FUNCTION TESTS: Recent Labs    09/02/18 0333 09/17/18 0410 10/27/18 1840 10/28/18 0635  BILITOT 0.3 0.6 1.0 1.2  AST 13* 11* 125* 178*  ALT 10 12 30  37  ALKPHOS 34* 54 108 92  PROT 6.5 7.3 7.2 6.8  ALBUMIN 2.6* 3.1* 2.7* 2.5*    Assessment and Plan: Pt with history of end-stage renal disease, diabetes, GERD, hypertension, hyperlipidemia, XXX syndrome and is status post recent removal of a failed renal transplant from the right iliac fossa region at [facility].  She now presents with abdominal pain, fever and CT findings of an air-fluid collection in the location of the previously transplanted kidney in the right iliac fossa suspicious for abscess.  Request now received from surgery for drainage of the abscess.  Imaging studies were reviewed by Dr. Anselm Pancoast. Risks and benefits discussed with the patient/sister including bleeding, infection, damage to adjacent structures, bowel perforation/fistula connection, and sepsis.  All of the patient's questions were answered, patient/sister are agreeable to proceed. Consent signed and in chart.  Procedure scheduled for today    Electronically Signed: D. Rowe Robert, PA-C 10/28/2018, 11:14 AM   I spent a total of 25 minutes at the the patient's bedside AND on the patient's  hospital floor or unit, greater than 50% of which was counseling/coordinating care for CT guided drainage of right iliac fossa abscess    Patient ID: [name], female   DOB: 09-08-1985, 33 y.o.   MRN: [id number]

## 2018-10-28 NOTE — Procedures (Signed)
Interventional Radiology Procedure:   Indications: RLQ air fluid collection at site of old renal transplant  Procedure: US guided drain placement  Findings: Greater than 60 ml of cloudy red fluid removed.  ? Infected hematoma. 12 Fr drain placed and attached to suction bulb.  Complications: None     EBL: less than 20 ml  Plan: Send fluid for culture.  Follow output.     Cohl Behrens R. Anselm Pancoast, MD  Pager: [phone]

## 2018-10-28 NOTE — Consult Note (Signed)
[facility] Renal Consultation Note    Indication for Consultation:  Management of ESRD/hemodialysis; anemia, hypertension/volume and secondary hyperparathyroidism PCP:  HPI: [name] is a 33 y.o. female with ESRD (HD resumed 03/2018  following Tx failure), HTN, XXX syndrome, Type 1 DM, hyperlipidemia, Hx pseudoseizures, s/p transplant nephrectomy [facility] 09/2018.    Noted symptomatic anemia at dialysis center yesterday. Outpatient hemoglobin was 6.1 on 8/12 and was sent to ED for further evaluation. Found to have hemoglobin of 6.9 here and transfused 1 unit prbcs. Also presented with abdominal pain, n,v. Abd CT revealed fluid collection in the location of previous transplant concerning for abscess. Empiric antibiotics started. Surgery consulted and recommend IR consult for drain placement.   Low grade fevers on admission.Tmax 100.2F Hgb improved to 7.6. Labs otherwise unremarkable for ESRD patient. Seen and examined at bedside. Denies chills, HA, CP, SOB, abd pain, N,V,D.   Dialyzes at [facility]. Last HD Friday. Using [facility]. S/p 1st stage BVT placement 8/13. No indications for urgent dialysis today.    Past Medical History:  Diagnosis Date  . Chronic kidney disease    kidney transplant 07  . Diabetes mellitus    Pt reports diagnosis in June 2011, Type 2  . GERD (gastroesophageal reflux disease)   . Hyperlipidemia   . Hypertension   . 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 Pyelonephritis 06/23/2014  . Seasonal allergies   . UTI (urinary tract infection) 01/09/2015  . 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 ARTERIOVENOUS GRAFT PLACEMENT Bilateral    "neither work" (10/24/2017)  . AV FISTULA PLACEMENT Left 10/26/2018   Procedure: CREATION  OF ARTERIOVENOUS FISTULA  LEFT ARM;  Surgeon: [name], MD;  Location: [place];  Service: Vascular;  Laterality: Left;  . CHOLECYSTECTOMY N/A 06/30/2017   Procedure: LAPAROSCOPIC CHOLECYSTECTOMY WITH INTRAOPERATIVE CHOLANGIOGRAM;  Surgeon: Excell Seltzer, MD;  Location: [facility];  Service: General;  Laterality: N/A;  . ESOPHAGOGASTRODUODENOSCOPY (EGD) WITH PROPOFOL N/A 07/04/2017   Procedure: ESOPHAGOGASTRODUODENOSCOPY (EGD) WITH PROPOFOL;  Surgeon: Clarene Essex, MD;  Location: [facility];  Service: Endoscopy;  Laterality: N/A;  . INSERTION OF DIALYSIS CATHETER N/A 03/20/2018   Procedure: INSERTION OF TUNNELED DIALYSIS CATHETER - RIGHT INTERANL JUGULAR PLACEMENT;  Surgeon: Angelia Mould, MD;  Location: [place];  Service: Vascular;  Laterality: N/A;  . KIDNEY TRANSPLANT  2007  . PARATHYROIDECTOMY  ?2012   "3/4 removed" (10/24/2017)  . RENAL BIOPSY Bilateral 2003  . UPPER EXTREMITY VENOGRAPHY Bilateral 10/19/2018   Procedure: UPPER EXTREMITY VENOGRAPHY;  Surgeon: [name], MD;  Location: [facility];  Service: Cardiovascular;  Laterality: Bilateral;  Bilateral    Family History  Problem Relation Age of Onset  . Arthritis Mother   . Diabetes Mother   . Hypertension Mother    Social History:  reports that she has never smoked. She has never used smokeless tobacco. She reports that she does not drink alcohol or use drugs. Allergies  Allergen Reactions  . Benadryl [Diphenhydramine-Zinc Acetate] Shortness Of Breath  . Diphenhydramine Hcl Anaphylaxis    REACTION: Stopped breathing in ICU  . Doxycycline Shortness Of Breath  . Motrin [Ibuprofen] Shortness Of Breath and Itching    Per pt  .  Banana Other (See Comments)    Sick on the stomach  . Iron Dextran Other (See Comments)    REACTION: vein irritation  . Shellfish Allergy Hives   Prior to Admission medications   Medication Sig Start Date End Date Taking? Authorizing Provider  acetaminophen (TYLENOL) 500  MG tablet Take 1,000 mg by mouth daily as needed for moderate pain.    Yes [provider]  amLODipine (NORVASC) 10 MG tablet Take 1 tablet (10 mg total) by mouth daily. 03/25/18  Yes Thurnell Lose, MD  b complex-vitamin c-folic acid (NEPHRO-VITE) 0.8 MG TABS tablet Take 1 tablet by mouth every Monday, Wednesday, and Friday. After dialysis on dialysis days 07/19/18  Yes [provider]  cetirizine (ZYRTEC) 10 MG tablet Take 10 mg by mouth daily as needed for allergies.   Yes [provider]  dicyclomine (BENTYL) 20 MG tablet Take 1 tablet (20 mg total) by mouth 2 (two) times daily. 02/06/18  Yes [name], Ripudeep K, MD  famotidine (PEPCID) 40 MG tablet Take 40 mg by mouth daily.    Yes [provider]  fluticasone (FLONASE) 50 MCG/ACT nasal spray Place 2 sprays into both nostrils daily as needed for allergies. 09/03/18  Yes [name], MD  gabapentin (NEURONTIN) 300 MG capsule Take 300 mg by mouth 2 (two) times daily.    Yes [provider]  hydrOXYzine (ATARAX/VISTARIL) 50 MG tablet Take 1 tablet (50 mg total) by mouth 2 (two) times daily as needed for itching. 02/06/18  Yes [name], Ripudeep K, MD  insulin aspart (NOVOLOG FLEXPEN) 100 UNIT/ML FlexPen Inject 12-15 Units into the skin See admin instructions. 5-6 times per day as needed for blood sugar management   Yes [provider]  insulin degludec (TRESIBA) 100 UNIT/ML SOPN FlexTouch Pen Inject 0.6 mLs (60 Units total) into the skin 2 (two) times daily. Reports taking 24 units QAM Patient taking differently: Inject 20 Units into the skin daily.  03/06/18  Yes Charlynne Cousins, MD  loperamide (IMODIUM) 2 MG capsule Take 2 mg by mouth as needed for diarrhea or loose stools.    Yes [provider]  metoCLOPramide (REGLAN) 10 MG tablet Take 1 tablet (10 mg total) by mouth 3 (three) times daily before meals. 02/06/18  Yes [name], Ripudeep K, MD  oxyCODONE (ROXICODONE) 5 MG immediate release  tablet Take 1 tablet (5 mg total) by mouth every 6 (six) hours as needed for severe pain. 10/26/18  Yes [name], PA-C  pantoprazole (PROTONIX) 40 MG tablet Take 40 mg by mouth 2 (two) times daily.   Yes [provider]  predniSONE (DELTASONE) 10 MG tablet Take 1 tablet (10 mg total) by mouth every morning. 03/06/18  Yes Charlynne Cousins, MD  promethazine (PHENERGAN) 25 MG tablet Take 1 tablet (25 mg total) by mouth every 6 (six) hours as needed for nausea or vomiting. 02/06/18  Yes [name], Ripudeep K, MD  sevelamer carbonate (RENVELA) 0.8 g PACK packet Take 2 packets by mouth 3 (three) times daily with meals. 10/09/18  Yes [provider]  simvastatin (ZOCOR) 20 MG tablet Take 20 mg by mouth at bedtime.    Yes [provider]  ACCU-CHEK SOFTCLIX LANCETS lancets Use to check blood sugar 4 times per day. 12/29/15   Renato Shin, MD   Current Facility-Administered Medications  Medication Dose Route Frequency Provider Last Rate Last Dose  . 0.9 %  sodium chloride infusion (Manually program via Guardrails IV Fluids)   Intravenous Once  Khatri, Hina, PA-C      . acetaminophen (TYLENOL) tablet 650 mg  650 mg Oral Q6H PRN Elwyn Reach, MD       Or  . acetaminophen (TYLENOL) suppository 650 mg  650 mg Rectal Q6H PRN Gala Romney L, MD      . amLODipine (NORVASC) tablet 10 mg  10 mg Oral Daily Elwyn Reach, MD   10 mg at 10/28/18 0911  . calcium carbonate (dosed in mg elemental calcium) suspension 500 mg of elemental calcium  500 mg of elemental calcium Oral Q6H PRN Elwyn Reach, MD      . camphor-menthol (SARNA) lotion 1 application  1 application Topical [id number] PRN Elwyn Reach, MD       And  . hydrOXYzine (ATARAX/VISTARIL) tablet 25 mg  25 mg Oral Q8H PRN Gala Romney L, MD      . dicyclomine (BENTYL) tablet 20 mg  20 mg Oral BID Elwyn Reach, MD   20 mg at 10/28/18 0912  . docusate sodium (ENEMEEZ) enema 283 mg  1 enema Rectal PRN Elwyn Reach, MD      . famotidine (PEPCID) tablet 40 mg  40 mg Oral Daily Elwyn Reach, MD   40 mg at 10/28/18 0911  . feeding supplement (NEPRO CARB STEADY) liquid 237 mL  237 mL Oral TID PRN Elwyn Reach, MD      . fluticasone (FLONASE) 50 MCG/ACT nasal spray 2 spray  2 spray Each Nare Daily PRN Elwyn Reach, MD      . gabapentin (NEURONTIN) capsule 300 mg  300 mg Oral BID Gala Romney L, MD   300 mg at 10/28/18 0911  . heparin injection 5,000 Units  5,000 Units Subcutaneous Q8H Garba, Mohammad L, MD      . insulin aspart (novoLOG) injection 0-5 Units  0-5 Units Subcutaneous QHS Garba, Mohammad L, MD      . insulin aspart (novoLOG) injection 0-9 Units  0-9 Units Subcutaneous TID WC Garba, Mohammad L, MD      . insulin glargine (LANTUS) injection 20 Units  20 Units Subcutaneous Daily Garba, Mohammad L, MD      . loperamide (IMODIUM) capsule 2 mg  2 mg Oral PRN Elwyn Reach, MD      . loratadine (CLARITIN) tablet 10 mg  10 mg Oral Daily Elwyn Reach, MD   10 mg at 10/28/18 0911  . meropenem (MERREM) 500 mg in sodium chloride 0.9 % 100 mL IVPB  500 mg Intravenous Daily Gala Romney L, MD      . metoCLOPramide (REGLAN) tablet 10 mg  10 mg Oral TID AC Elwyn Reach, MD   10 mg at 10/28/18 0912  . multivitamin (RENA-VIT) tablet 1 tablet  1 tablet Oral QHS Garba, Mohammad L, MD      . ondansetron (ZOFRAN) tablet 4 mg  4 mg Oral Q6H PRN Elwyn Reach, MD       Or  . ondansetron (ZOFRAN) injection 4 mg  4 mg Intravenous Q6H PRN Garba, Mohammad L, MD      . oxyCODONE (Oxy IR/ROXICODONE) immediate release tablet 5 mg  5 mg Oral Q6H PRN Gala Romney L, MD      . pantoprazole (PROTONIX) EC tablet 40 mg  40 mg Oral BID Elwyn Reach, MD   40 mg at 10/28/18 0911  . predniSONE (DELTASONE) tablet 10 mg  10 mg Oral Q breakfast Elwyn Reach, MD  10 mg at 10/28/18 0911  . promethazine (PHENERGAN) tablet 25 mg  25 mg Oral Q6H PRN Elwyn Reach, MD      .  sevelamer carbonate (RENVELA) packet 1.6 g  1.6 g Oral TID WC Garba, Mohammad L, MD      . simvastatin (ZOCOR) tablet 20 mg  20 mg Oral QHS Garba, Mohammad L, MD      . sorbitol 70 % solution 30 mL  30 mL Oral PRN Elwyn Reach, MD      . vancomycin (VANCOCIN) 1,750 mg in sodium chloride 0.9 % 500 mL IVPB  1,750 mg Intravenous Once Henri Medal, RPH      . [START ON 10/30/2018] vancomycin (VANCOCIN) IVPB 750 mg/150 ml premix  750 mg Intravenous Q M,W,F-HD Henri Medal, RPH      . zolpidem (AMBIEN) tablet 5 mg  5 mg Oral QHS PRN Elwyn Reach, MD         ROS: As per HPI otherwise negative.  Physical Exam: Vitals:   10/28/18 0220 10/28/18 0225 10/28/18 0357 10/28/18 0930  BP:   102/70 109/78  Pulse:   (!) 104 (!) 102  Resp:   18 18  Temp:   (!) 100.4 F (38 C) 98.9 F (37.2 C)  TempSrc:   Oral Oral  SpO2: 95% 95% 93% 91%     General: WDWN NAD Head: NCAT sclera not icteric MMM Neck: Supple. No JVD No masses Lungs: CTA bilaterally without wheezes, rales, or rhonchi. Breathing is unlabored. Heart: RRR with S1 S2 Abdomen: soft NT + BS Lower extremities:without edema or ischemic changes, no open wounds  Neuro: A & O  X 3. Moves all extremities spontaneously. Psych:  Responds to questions appropriately with a normal affect. Dialysis Access:  Labs: Basic Metabolic Panel: Recent Labs  Lab 10/26/18 1036 10/27/18 1840 10/28/18 0635  NA 129* 136 135  K 4.7 3.6 3.7  CL 94* 96* 96*  CO2  --  25 24  GLUCOSE 365* 190* 230*  BUN 36* 9 13  CREATININE 6.70* 3.69* 5.29*  CALCIUM  --  9.9 9.1   Liver Function Tests: Recent Labs  Lab 10/27/18 1840 10/28/18 0635  AST 125* 178*  ALT 30 37  ALKPHOS 108 92  BILITOT 1.0 1.2  PROT 7.2 6.8  ALBUMIN 2.7* 2.5*   No results for input(s): LIPASE, AMYLASE in the last 168 hours. No results for input(s): AMMONIA in the last 168 hours. CBC: Recent Labs  Lab 10/26/18 1036 10/27/18 1840 10/28/18 0635  WBC  --  5.9 5.7   NEUTROABS  --  3.4  --   HGB 7.5* 6.9* 7.6*  HCT 22.0* 23.7* 25.2*  MCV  --  90.1 90.0  PLT  --  304 290   Cardiac Enzymes: No results for input(s): CKTOTAL, CKMB, CKMBINDEX, TROPONINI in the last 168 hours. CBG: Recent Labs  Lab 10/26/18 1208 10/26/18 1257 10/26/18 1416 10/28/18 0356 10/28/18 0759  GLUCAP 331* 263* 241* 239* 199*   Iron Studies: No results for input(s): IRON, TIBC, TRANSFERRIN, FERRITIN in the last 72 hours. Studies/Results: Dg Chest 2 View  Result Date: 10/27/2018 CLINICAL DATA:  Patient felt a seizure developing during dialysis today. EXAM: CHEST - 2 VIEW COMPARISON:  Chest x-rays dated 09/01/2018 and 05/01/2018. FINDINGS: Stable cardiomegaly. Overall cardiomediastinal silhouette is stable. Dialysis catheter appears stable in position with tip overlying the RIGHT atrium. Stable streaky opacities at the RIGHT lung base, likely atelectasis. No new lung findings.  No pleural effusion or pneumothorax seen. Osseous structures about the chest are unremarkable. IMPRESSION: No active cardiopulmonary disease. Stable cardiomegaly. Stable atelectasis at the RIGHT lung base. No evidence of pneumonia or pulmonary edema. Electronically Signed   By: Franki Cabot M.D.   On: 10/27/2018 19:21   Ct Abdomen Pelvis W Contrast  Result Date: 10/27/2018 CLINICAL DATA:  Acute generalized abdominal pain. 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 EXAM: CT ABDOMEN AND PELVIS WITH CONTRAST TECHNIQUE: Multidetector CT imaging of the abdomen and pelvis was performed using the standard protocol following bolus administration of intravenous contrast. CONTRAST:  139mL OMNIPAQUE IOHEXOL 300 MG/ML  SOLN COMPARISON:  09/17/2018, additional priors. FINDINGS: Lower chest: Central catheter tip at the atrial caval junction. Increased lung base atelectasis from prior exam. Hepatobiliary: No focal liver abnormality is seen. Status post cholecystectomy. No biliary dilatation. Pancreas: No  ductal dilatation or inflammation. Spleen: Upper normal in size without focal abnormality. Adrenals/Urinary Tract: Normal adrenal glands. Chronically atrophic left native kidney. The right kidney not visualized. In the location of the previous right iliac fossa renal transplant is a heterogeneous air-fluid collection with peripheral enhancement measuring 11.3 x 5.9 x 6.4 cm. Urinary bladder is nondistended and not well evaluated. Stomach/Bowel: Bowel evaluation is limited in the absence of enteric contrast. Stomach is decompressed. No evidence of bowel obstruction, wall thickening or inflammation. Appendix tentatively identified and normal. Small volume of stool in the colon. Vascular/Lymphatic: Normal caliber abdominal aorta. The portal vein is patent. There multiple prominent retroperitoneal lymph nodes that are similar to prior exam. Enlarged right external iliac node measuring 13 mm short axis is adjacent to prior renal transplant. Additional prominent lymph nodes in the right inguinal region. Reproductive: Uterus not visualized.  No adnexal masses. Other: Recent midline and right lower abdominal wound. Mild edema and minimal scattered fluid along the incision site, but no drainable subcutaneous fluid collection. Trace perihepatic free fluid. No free air. Musculoskeletal: No acute osseous abnormalities. Suggestion of mild renal osteodystrophy. IMPRESSION: 1. Heterogeneous air-fluid collection in the location of the previous right iliac fossa renal transplant measuring 11.3 x 5.9 x 6.4 cm, suspicious for abscess. 2. Enlarged right external iliac and right inguinal lymph nodes are likely reactive. 3. Recent midline and right lower abdominal surgical wound. Mild edema and minimal scattered fluid along the incision site, but no drainable subcutaneous fluid collection. Electronically Signed   By: Keith Rake M.D.   On: 10/27/2018 23:06    Dialysis Orders:  [facility] MWF  4H BFR 350/800 EDW 76.5kg 2K/3.5Ca UFP  4 TDC Hep bolus 4000 + 2000 midrun Mircera 225 IV q 2 weeks (dosed 8/10)   Assessment/Plan: 1. Abd pain/fevers  s/p recent transplant nephrectomy. Abd CT showing air-fluid collection in previous transplant site concerning for intra-abdominal abscess. Vancomycin Lajean Silvius per pharmacy. IR consult for abscess drainage today.  2. ESRD -  MWF HD. No urgent indications for HD. Continue on schedule. Next HD 8/17 3. Hypertension/volume  - BP controlled. No volume excess on exam.  4. Anemia  - Hgb 7.6 s/p 1 unit prbc. On max ESA as outpatient. Follow trends.  5. Metabolic bone disease -  Using Added Ca++ bath as outpatient, CCa 10.3 here, follow trends. PhosLo binders + Tums as outpatient.  6. Nutrition - Renal diet, supplements when eating  7. DMType 1- Insulin per primary   Lynnda Child PA-C [place] Pager [phone] 10/28/2018, 10:31 AM

## 2018-10-28 NOTE — H&P (Signed)
History and Physical   [name] [id number] DOB: 01/28/1986 DOA: 10/27/2018  Referring MD/NP/PA: Dr. Stark Jock  PCP: [name], MD   Outpatient Specialists: [facility]  Patient coming from: Home  Chief Complaint: Abdominal pain  HPI: [name] is a 33 y.o. female with medical history significant of end-stage renal disease on hemodialysis Monday Wednesdays and Fridays who has failed kidney transplant recently with removal of the transplant about 2 months ago presenting with abdominal pain weakness.  Patient also has diabetes and GERD.  She denied any fever or chills.  Denied any nausea vomiting or diarrhea.  Patient had problem with dialysis access and had new AV fistula placed  yesterday.  She describes her pain is 8 out of 10 in the middle of the abdomen.  Associated with some nausea but no vomiting.  Patient also has pain at the incision where she had a nephrectomy done in the right lower abdomen.  Work-up in the ER including CT now shows possible intra-abdominal abscess at the area where she had the kidney removed.  She is therefore being admitted to the hospital for further evaluation.  Patient was also noted to have hemoglobin 6.9.  It was 7.5 yesterday.  No evidence of acute bleed.  Stool guaiac reportedly negative..  ED Course: Temperature is 100.5 blood pressure 91/55 pulse 121 respiratory rate of 22 oxygen sats 92% on room air.  White count is 5.9 hemoglobin 6.9 and platelets 304.  Sodium 136 potassium 3.6 chloride 96 CO2 25 BUN 9 creatinine 3.69 calcium 9.9.  Glucose 199 lactic acid 1.2.  CT abdomen pelvis shows heterogeneous air-fluid collection in the location of the previous right iliac fossa renal transplant it measures 11.3 x 5.9 x 6.4 cm suspicious for abscess.  Review of Systems: As per HPI otherwise 10 point review of systems negative.    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   Pyelonephritis 06/23/2014   Seasonal allergies    UTI (urinary tract infection) 01/09/2015   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   Qualifier: Diagnosis of  By: Carlena Sax  MD, Colletta [place]      Past Surgical History:  Procedure Laterality Date   ARTERIOVENOUS GRAFT PLACEMENT Bilateral    "neither work" (10/24/2017)   AV FISTULA PLACEMENT Left 10/26/2018   Procedure: CREATION OF ARTERIOVENOUS FISTULA  LEFT ARM;  Surgeon: [name], MD;  Location: [facility];  Service: Vascular;  Laterality: Left;   CHOLECYSTECTOMY N/A 06/30/2017   Procedure: LAPAROSCOPIC CHOLECYSTECTOMY WITH INTRAOPERATIVE CHOLANGIOGRAM;  Surgeon: Excell Seltzer, MD;  Location: [facility];  Service: General;  Laterality: N/A;   ESOPHAGOGASTRODUODENOSCOPY (EGD) WITH PROPOFOL N/A 07/04/2017   Procedure: ESOPHAGOGASTRODUODENOSCOPY (EGD) WITH PROPOFOL;  Surgeon: Clarene Essex, MD;  Location: [facility];  Service: Endoscopy;  Laterality: N/A;   INSERTION OF DIALYSIS CATHETER N/A 03/20/2018   Procedure: INSERTION OF TUNNELED DIALYSIS CATHETER - RIGHT INTERANL JUGULAR PLACEMENT;  Surgeon: Angelia Mould, MD;  Location: [facility];  Service: Vascular;  Laterality: N/A;   KIDNEY TRANSPLANT  2007   PARATHYROIDECTOMY  ?2012   "3/4 removed" (10/24/2017)  RENAL BIOPSY Bilateral 2003   UPPER EXTREMITY VENOGRAPHY Bilateral 10/19/2018   Procedure: UPPER EXTREMITY VENOGRAPHY;  Surgeon: [name], MD;  Location: [facility];  Service: Cardiovascular;  Laterality: Bilateral;  Bilateral      reports that she has never smoked. She has never used smokeless tobacco. She reports that she does not drink alcohol or use drugs.  Allergies   Allergen Reactions   Benadryl [Diphenhydramine-Zinc Acetate] Shortness Of Breath   Diphenhydramine Hcl Anaphylaxis    REACTION: Stopped breathing in ICU   Doxycycline Shortness Of Breath   Motrin [Ibuprofen] Shortness Of Breath and Itching    Per pt   Banana Other (See Comments)    Sick on the stomach   Iron Dextran Other (See Comments)    REACTION: vein irritation   Shellfish Allergy Hives    Family History  Problem Relation Age of Onset   Arthritis Mother    Diabetes Mother    Hypertension Mother      Prior to Admission medications   Medication Sig Start Date End Date Taking? Authorizing Provider  acetaminophen (TYLENOL) 500 MG tablet Take 1,000 mg by mouth daily as needed for moderate pain.    Yes [provider]  amLODipine (NORVASC) 10 MG tablet Take 1 tablet (10 mg total) by mouth daily. 03/25/18  Yes Thurnell Lose, MD  b complex-vitamin c-folic acid (NEPHRO-VITE) 0.8 MG TABS tablet Take 1 tablet by mouth every Monday, Wednesday, and Friday. After dialysis on dialysis days 07/19/18  Yes [provider]  cetirizine (ZYRTEC) 10 MG tablet Take 10 mg by mouth daily as needed for allergies.   Yes [provider]  dicyclomine (BENTYL) 20 MG tablet Take 1 tablet (20 mg total) by mouth 2 (two) times daily. 02/06/18  Yes [name], Ripudeep K, MD  famotidine (PEPCID) 40 MG tablet Take 40 mg by mouth daily.    Yes [provider]  fluticasone (FLONASE) 50 MCG/ACT nasal spray Place 2 sprays into both nostrils daily as needed for allergies. 09/03/18  Yes [name], MD  gabapentin (NEURONTIN) 300 MG capsule Take 300 mg by mouth 2 (two) times daily.    Yes [provider]  hydrOXYzine (ATARAX/VISTARIL) 50 MG tablet Take 1 tablet (50 mg total) by mouth 2 (two) times daily as needed for itching. 02/06/18  Yes [name], Ripudeep K, MD  insulin aspart (NOVOLOG FLEXPEN) 100 UNIT/ML FlexPen Inject 12-15 Units into the skin See admin instructions.  5-6 times per day as needed for blood sugar management   Yes [provider]  insulin degludec (TRESIBA) 100 UNIT/ML SOPN FlexTouch Pen Inject 0.6 mLs (60 Units total) into the skin 2 (two) times daily. Reports taking 24 units QAM Patient taking differently: Inject 20 Units into the skin daily.  03/06/18  Yes Charlynne Cousins, MD  loperamide (IMODIUM) 2 MG capsule Take 2 mg by mouth as needed for diarrhea or loose stools.    Yes [provider]  metoCLOPramide (REGLAN) 10 MG tablet Take 1 tablet (10 mg total) by mouth 3 (three) times daily before meals. 02/06/18  Yes [name], Ripudeep K, MD  oxyCODONE (ROXICODONE) 5 MG immediate release tablet Take 1 tablet (5 mg total) by mouth every 6 (six) hours as needed for severe pain. 10/26/18  Yes [name], PA-C  pantoprazole (PROTONIX) 40 MG tablet Take 40 mg by mouth 2 (two) times daily.   Yes [provider]  predniSONE (DELTASONE) 10 MG tablet Take 1 tablet (10 mg  total) by mouth every morning. 03/06/18  Yes Charlynne Cousins, MD  promethazine (PHENERGAN) 25 MG tablet Take 1 tablet (25 mg total) by mouth every 6 (six) hours as needed for nausea or vomiting. 02/06/18  Yes [name], Ripudeep K, MD  sevelamer carbonate (RENVELA) 0.8 g PACK packet Take 2 packets by mouth 3 (three) times daily with meals. 10/09/18  Yes [provider]  simvastatin (ZOCOR) 20 MG tablet Take 20 mg by mouth at bedtime.    Yes [provider]  ACCU-CHEK SOFTCLIX LANCETS lancets Use to check blood sugar 4 times per day. 12/29/15   Renato Shin, MD    Physical Exam: Vitals:   10/27/18 2325 10/27/18 2330 10/27/18 2345 10/28/18 0038  BP: 107/72 103/78 110/76 (!) 91/55  Pulse: (!) 117 (!) 121 (!) 116 (!) 118  Resp: 12 15 (!) 22 19  Temp:    (!) 100.5 F (38.1 C)  TempSrc:    Oral  SpO2: 98% 98% 95% 95%      Constitutional: NAD, calm, acutely ill looking Vitals:   10/27/18 2325 10/27/18 2330 10/27/18 2345 10/28/18 0038    BP: 107/72 103/78 110/76 (!) 91/55  Pulse: (!) 117 (!) 121 (!) 116 (!) 118  Resp: 12 15 (!) 22 19  Temp:    (!) 100.5 F (38.1 C)  TempSrc:    Oral  SpO2: 98% 98% 95% 95%   Eyes: PERRL, lids and conjunctivae normal ENMT: Mucous membranes are moist. Posterior pharynx clear of any exudate or lesions.Normal dentition.  Neck: normal, supple, no masses, no thyromegaly Respiratory: clear to auscultation bilaterally, no wheezing, no crackles. Normal respiratory effort. No accessory muscle use.  Cardiovascular: Sinus tachycardia no murmurs / rubs / gallops. No extremity edema. 2+ pedal pulses. No carotid bruits.  Abdomen: Diffuse suprapubic tenderness,, no masses palpated. No hepatosplenomegaly. Bowel sounds positive.  Musculoskeletal: no clubbing / cyanosis. No joint deformity upper and lower extremities. Good ROM, no contractures. Normal muscle tone.  Skin: no rashes, lesions, ulcers. No induration Neurologic: CN 2-12 grossly intact. Sensation intact, DTR normal. Strength 5/5 in all 4.  Psychiatric: Normal judgment and insight. Alert and oriented x 3. Normal mood.     Labs on Admission: I have personally reviewed following labs and imaging studies  CBC: Recent Labs  Lab 10/26/18 1036 10/27/18 1840  WBC  --  5.9  NEUTROABS  --  3.4  HGB 7.5* 6.9*  HCT 22.0* 23.7*  MCV  --  90.1  PLT  --  123456   Basic Metabolic Panel: Recent Labs  Lab 10/26/18 1036 10/27/18 1840  NA 129* 136  K 4.7 3.6  CL 94* 96*  CO2  --  25  GLUCOSE 365* 190*  BUN 36* 9  CREATININE 6.70* 3.69*  CALCIUM  --  9.9   GFR: Estimated Creatinine Clearance: 22.5 mL/min (A) (by C-G formula based on SCr of 3.69 mg/dL (H)). Liver Function Tests: Recent Labs  Lab 10/27/18 1840  AST 125*  ALT 30  ALKPHOS 108  BILITOT 1.0  PROT 7.2  ALBUMIN 2.7*   No results for input(s): LIPASE, AMYLASE in the last 168 hours. No results for input(s): AMMONIA in the last 168 hours. Coagulation Profile: No results for  input(s): INR, PROTIME in the last 168 hours. Cardiac Enzymes: No results for input(s): CKTOTAL, CKMB, CKMBINDEX, TROPONINI in the last 168 hours. BNP (last 3 results) No results for input(s): PROBNP in the last 8760 hours. HbA1C: No results for input(s): HGBA1C in the  last 72 hours. CBG: Recent Labs  Lab 10/26/18 1006 10/26/18 1208 10/26/18 1257 10/26/18 1416  GLUCAP 360* 331* 263* 241*   Lipid Profile: No results for input(s): CHOL, HDL, LDLCALC, TRIG, CHOLHDL, LDLDIRECT in the last 72 hours. Thyroid Function Tests: No results for input(s): TSH, T4TOTAL, FREET4, T3FREE, THYROIDAB in the last 72 hours. Anemia Panel: No results for input(s): VITAMINB12, FOLATE, FERRITIN, TIBC, IRON, RETICCTPCT in the last 72 hours. Urine analysis:    Component Value Date/Time   COLORURINE RED (A) 09/03/2018 1118   APPEARANCEUR CLOUDY (A) 09/03/2018 1118   LABSPEC 1.015 09/03/2018 1118   PHURINE 8.0 09/03/2018 1118   GLUCOSEU 250 (A) 09/03/2018 1118   HGBUR LARGE (A) 09/03/2018 1118   HGBUR negative 01/26/2010 1026   BILIRUBINUR NEGATIVE 09/03/2018 1118   BILIRUBINUR NEG 01/01/2011 [place] 09/03/2018 1118   PROTEINUR >300 (A) 09/03/2018 1118   UROBILINOGEN 0.2 01/09/2015 1333   NITRITE POSITIVE (A) 09/03/2018 1118   LEUKOCYTESUR TRACE (A) 09/03/2018 1118   Sepsis Labs: @LABRCNTIP (procalcitonin:4,lacticidven:4) ) Recent Results (from the past 240 hour(s))  SARS CORONAVIRUS 2 Nasal Swab Aptima Multi Swab     Status: None   Collection Time: 10/24/18 12:10 PM   Specimen: Aptima Multi Swab; Nasal Swab  Result Value Ref Range Status   SARS Coronavirus 2 NEGATIVE NEGATIVE Final    Comment: (NOTE) SARS-CoV-2 target nucleic acids are NOT DETECTED. The SARS-CoV-2 RNA is generally detectable in upper and lower respiratory specimens during the acute phase of infection. Negative results do not preclude SARS-CoV-2 infection, do not rule out co-infections with other pathogens,  and should not be used as the sole basis for treatment or other patient management decisions. Negative results must be combined with clinical observations, patient history, and epidemiological information. The expected result is Negative. Fact Sheet for Patients: SugarRoll.be Fact Sheet for Healthcare Providers: https://www.woods-mathews.com/ This test is not yet approved or cleared by the [place] FDA and  has been authorized for detection and/or diagnosis of SARS-CoV-2 by FDA under an Emergency Use Authorization (EUA). This EUA will remain  in effect (meaning this test can be used) for the duration of the COVID-19 declaration under Section 56 4(b)(1) of the Act, 21 U.S.C. section 360bbb-3(b)(1), unless the authorization is terminated or revoked sooner. Performed at [facility], [place] [address], [place], [place] [address]      Radiological Exams on Admission: Dg Chest 2 View  Result Date: 10/27/2018 CLINICAL DATA:  Patient felt a seizure developing during dialysis today. EXAM: CHEST - 2 VIEW COMPARISON:  Chest x-rays dated 09/01/2018 and 05/01/2018. FINDINGS: Stable cardiomegaly. Overall cardiomediastinal silhouette is stable. Dialysis catheter appears stable in position with tip overlying the RIGHT atrium. Stable streaky opacities at the RIGHT lung base, likely atelectasis. No new lung findings. No pleural effusion or pneumothorax seen. Osseous structures about the chest are unremarkable. IMPRESSION: No active cardiopulmonary disease. Stable cardiomegaly. Stable atelectasis at the RIGHT lung base. No evidence of pneumonia or pulmonary edema. Electronically Signed   By: Franki Cabot M.D.   On: 10/27/2018 19:21   Ct Abdomen Pelvis W Contrast  Result Date: 10/27/2018 CLINICAL DATA:  Acute generalized abdominal pain. 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 EXAM: CT ABDOMEN AND PELVIS WITH CONTRAST TECHNIQUE:  Multidetector CT imaging of the abdomen and pelvis was performed using the standard protocol following bolus administration of intravenous contrast. CONTRAST:  155mL OMNIPAQUE IOHEXOL 300 MG/ML  SOLN COMPARISON:  09/17/2018, additional priors. FINDINGS:  Lower chest: Central catheter tip at the atrial caval junction. Increased lung base atelectasis from prior exam. Hepatobiliary: No focal liver abnormality is seen. Status post cholecystectomy. No biliary dilatation. Pancreas: No ductal dilatation or inflammation. Spleen: Upper normal in size without focal abnormality. Adrenals/Urinary Tract: Normal adrenal glands. Chronically atrophic left native kidney. The right kidney not visualized. In the location of the previous right iliac fossa renal transplant is a heterogeneous air-fluid collection with peripheral enhancement measuring 11.3 x 5.9 x 6.4 cm. Urinary bladder is nondistended and not well evaluated. Stomach/Bowel: Bowel evaluation is limited in the absence of enteric contrast. Stomach is decompressed. No evidence of bowel obstruction, wall thickening or inflammation. Appendix tentatively identified and normal. Small volume of stool in the colon. Vascular/Lymphatic: Normal caliber abdominal aorta. The portal vein is patent. There multiple prominent retroperitoneal lymph nodes that are similar to prior exam. Enlarged right external iliac node measuring 13 mm short axis is adjacent to prior renal transplant. Additional prominent lymph nodes in the right inguinal region. Reproductive: Uterus not visualized.  No adnexal masses. Other: Recent midline and right lower abdominal wound. Mild edema and minimal scattered fluid along the incision site, but no drainable subcutaneous fluid collection. Trace perihepatic free fluid. No free air. Musculoskeletal: No acute osseous abnormalities. Suggestion of mild renal osteodystrophy. IMPRESSION: 1. Heterogeneous air-fluid collection in the location of the previous right iliac  fossa renal transplant measuring 11.3 x 5.9 x 6.4 cm, suspicious for abscess. 2. Enlarged right external iliac and right inguinal lymph nodes are likely reactive. 3. Recent midline and right lower abdominal surgical wound. Mild edema and minimal scattered fluid along the incision site, but no drainable subcutaneous fluid collection. Electronically Signed   By: Keith Rake M.D.   On: 10/27/2018 23:06      Assessment/Plan Principal Problem:   Intra-abdominal abscess (HCC) Active Problems:   OBESITY   Anemia due to chronic kidney disease   Essential hypertension, benign   Type 1 diabetes mellitus with complication, uncontrolled (HCC)   Chronic pain   ESRD on hemodialysis ([place])     #1 intra-abdominal abscess: Noted on CT.  Patient has low-grade temperature but no leukocytosis.  We will admit and follow surgical recommendation.  Patient most likely may have percutaneous drainage with cultures.  Empirically start on meropenem.  May require ID consultation if to abscess.  #2 end-stage renal disease: Currently on hemodialysis.  Failed previous renal transplant.  Continue treatment.  #3 anemia of chronic disease: Becoming symptomatic.  Transfuse 1 unit of packed red blood cells.  #4 hypertension: Continue with home regimen.  At this point she is hypotensive.  Hold blood pressure medications temporarily.  #5 diabetes: Continue long-acting insulin with sliding scale insulin.  #6 chronic pain syndrome: Continue home regimen.   DVT prophylaxis: SCD Code Status: Full code Family Communication: Significant other in the room Disposition Plan: Home Consults called: General surgery.  Consult nephrology in the morning Admission status: Inpatient  Severity of Illness: The appropriate patient status for this patient is INPATIENT. Inpatient status is judged to be reasonable and necessary in order to provide the required intensity of service to ensure the patient's safety. The patient's  presenting symptoms, physical exam findings, and initial radiographic and laboratory data in the context of their chronic comorbidities is felt to place them at high risk for further clinical deterioration. Furthermore, it is not anticipated that the patient will be medically stable for discharge from the hospital within 2 midnights of admission. The following factors  support the patient status of inpatient.   " The patient's presenting symptoms include abdominal pain. " The worrisome physical exam findings include tenderness in the abdomen. " The initial radiographic and laboratory data are worrisome because of abscess on CT. " The chronic co-morbidities include type 1 diabetes with end-stage renal disease.   * I certify that at the point of admission it is my clinical judgment that the patient will require inpatient hospital care spanning beyond 2 midnights from the point of admission due to high intensity of service, high risk for further deterioration and high frequency of surveillance required.Barbette Merino MD [facility] Pager [phone]  If 7PM-7AM, please contact night-coverage [email or web address] Password [facility]  10/28/2018, 12:50 AM

## 2018-10-28 NOTE — Plan of Care (Signed)
  Problem: Activity: Goal: Risk for activity intolerance will decrease Outcome: Progressing   

## 2018-10-28 NOTE — Progress Notes (Signed)
Patient ID: [name], female   DOB: 08/22/1985, 33 y.o.   MRN: [id number]  Patient was admitted early this morning for intraabdominal abscess. She has been started on iv antibiotic. I will add IV vancomycin as well.  Patient seen and examined at bedside.  I have reviewed patient's current medical records including this morning's H&P, current vitals, labs and medications myself.  General surgery has recommended IR guided aspiration.  Follow cultures.  I have notified Dr. Andres Labrum about patient's admission.  Repeat a.m. labs.

## 2018-10-28 NOTE — Progress Notes (Signed)
Pharmacy Antibiotic Note  [name] is a 33 y.o. female admitted on 10/27/2018 with possible intraabdominal abcess.  Pharmacy has been consulted for Meropenem dosing.  Plan: Meropenem 500 mg IV q24h     Temp (24hrs), Avg:98.7 F (37.1 C), Min:98.7 F (37.1 C), Max:98.7 F (37.1 C)  Recent Labs  Lab 10/26/18 1036 10/27/18 1840  WBC  --  5.9  CREATININE 6.70* 3.69*  LATICACIDVEN  --  1.2    Estimated Creatinine Clearance: 22.5 mL/min (A) (by C-G formula based on SCr of 3.69 mg/dL (H)).    Allergies  Allergen Reactions  . Benadryl [Diphenhydramine-Zinc Acetate] Shortness Of Breath  . Diphenhydramine Hcl Anaphylaxis    REACTION: Stopped breathing in ICU  . Doxycycline Shortness Of Breath  . Motrin [Ibuprofen] Shortness Of Breath and Itching    Per pt  . Banana Other (See Comments)    Sick on the stomach  . Iron Dextran Other (See Comments)    REACTION: vein irritation  . Shellfish Allergy Hives    Caryl Pina 10/28/2018 12:12 AM

## 2018-10-28 NOTE — Progress Notes (Signed)
[facility] Progress Note     Subjective: CC-  Comfortable this morning. Upset that she has not eaten anything today. Denies abdominal pain. Denies n/v. WBC 5.7, TMAX 100.5 IR consult pending  Objective: Vital signs in last 24 hours: Temp:  [98.7 F (37.1 C)-100.5 F (38.1 C)] 100.4 F (38 C) (08/15 0357) Pulse Rate:  [104-122] 104 (08/15 0357) Resp:  [12-22] 18 (08/15 0357) BP: (90-125)/(55-83) 102/70 (08/15 0357) SpO2:  [92 %-100 %] 93 % (08/15 0357)    Intake/Output from previous day: 08/14 0701 - 08/15 0700 In: 730 [Blood:630; IV Piggyback:100] Out: -  Intake/Output this shift: No intake/output data recorded.  PE: Gen:  Alert, NAD HEENT: EOM's intact, pupils equal and round Pulm:  Rate and effort normal Abd: Soft, nondistended, +BS, no HSM, no hernia. Well healed midline and RLQ incisions. Mild RLQ TTP without rebound or guarding Psych: A&Ox3  Skin: no rashes noted, warm and dry  Lab Results:  Recent Labs    10/27/18 1840 10/28/18 0635  WBC 5.9 5.7  HGB 6.9* 7.6*  HCT 23.7* 25.2*  PLT 304 290   BMET Recent Labs    10/27/18 1840 10/28/18 0635  NA 136 135  K 3.6 3.7  CL 96* 96*  CO2 25 24  GLUCOSE 190* 230*  BUN 9 13  CREATININE 3.69* 5.29*  CALCIUM 9.9 9.1   PT/INR No results for input(s): LABPROT, INR in the last 72 hours. CMP     Component Value Date/Time   NA 135 10/28/2018 0635   K 3.7 10/28/2018 0635   CL 96 (L) 10/28/2018 0635   CO2 24 10/28/2018 0635   GLUCOSE 230 (H) 10/28/2018 0635   BUN 13 10/28/2018 0635   CREATININE 5.29 (H) 10/28/2018 0635   CALCIUM 9.1 10/28/2018 0635   CALCIUM 5.7 (LL) 03/20/2018 1915   PROT 6.8 10/28/2018 0635   ALBUMIN 2.5 (L) 10/28/2018 0635   AST 178 (H) 10/28/2018 0635   ALT 37 10/28/2018 0635   ALKPHOS 92 10/28/2018 0635   BILITOT 1.2 10/28/2018 0635   GFRNONAA 10 (L) 10/28/2018 0635   GFRAA 11 (L) 10/28/2018 0635   Lipase     Component Value Date/Time   LIPASE 43 09/17/2018  0417       Studies/Results: Dg Chest 2 View  Result Date: 10/27/2018 CLINICAL DATA:  Patient felt a seizure developing during dialysis today. EXAM: CHEST - 2 VIEW COMPARISON:  Chest x-rays dated 09/01/2018 and 05/01/2018. FINDINGS: Stable cardiomegaly. Overall cardiomediastinal silhouette is stable. Dialysis catheter appears stable in position with tip overlying the RIGHT atrium. Stable streaky opacities at the RIGHT lung base, likely atelectasis. No new lung findings. No pleural effusion or pneumothorax seen. Osseous structures about the chest are unremarkable. IMPRESSION: No active cardiopulmonary disease. Stable cardiomegaly. Stable atelectasis at the RIGHT lung base. No evidence of pneumonia or pulmonary edema. Electronically Signed   By: Franki Cabot M.D.   On: 10/27/2018 19:21   Ct Abdomen Pelvis W Contrast  Result Date: 10/27/2018 CLINICAL DATA:  Acute generalized abdominal pain. 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 EXAM: CT ABDOMEN AND PELVIS WITH CONTRAST TECHNIQUE: Multidetector CT imaging of the abdomen and pelvis was performed using the standard protocol following bolus administration of intravenous contrast. CONTRAST:  187mL OMNIPAQUE IOHEXOL 300 MG/ML  SOLN COMPARISON:  09/17/2018, additional priors. FINDINGS: Lower chest: Central catheter tip at the atrial caval junction. Increased lung base atelectasis from prior exam. Hepatobiliary: No focal liver abnormality is seen. Status  post cholecystectomy. No biliary dilatation. Pancreas: No ductal dilatation or inflammation. Spleen: Upper normal in size without focal abnormality. Adrenals/Urinary Tract: Normal adrenal glands. Chronically atrophic left native kidney. The right kidney not visualized. In the location of the previous right iliac fossa renal transplant is a heterogeneous air-fluid collection with peripheral enhancement measuring 11.3 x 5.9 x 6.4 cm. Urinary bladder is nondistended and not well evaluated.  Stomach/Bowel: Bowel evaluation is limited in the absence of enteric contrast. Stomach is decompressed. No evidence of bowel obstruction, wall thickening or inflammation. Appendix tentatively identified and normal. Small volume of stool in the colon. Vascular/Lymphatic: Normal caliber abdominal aorta. The portal vein is patent. There multiple prominent retroperitoneal lymph nodes that are similar to prior exam. Enlarged right external iliac node measuring 13 mm short axis is adjacent to prior renal transplant. Additional prominent lymph nodes in the right inguinal region. Reproductive: Uterus not visualized.  No adnexal masses. Other: Recent midline and right lower abdominal wound. Mild edema and minimal scattered fluid along the incision site, but no drainable subcutaneous fluid collection. Trace perihepatic free fluid. No free air. Musculoskeletal: No acute osseous abnormalities. Suggestion of mild renal osteodystrophy. IMPRESSION: 1. Heterogeneous air-fluid collection in the location of the previous right iliac fossa renal transplant measuring 11.3 x 5.9 x 6.4 cm, suspicious for abscess. 2. Enlarged right external iliac and right inguinal lymph nodes are likely reactive. 3. Recent midline and right lower abdominal surgical wound. Mild edema and minimal scattered fluid along the incision site, but no drainable subcutaneous fluid collection. Electronically Signed   By: Keith Rake M.D.   On: 10/27/2018 23:06    Anti-infectives: Anti-infectives (From admission, onward)   Start     Dose/Rate Route Frequency Ordered Stop   10/28/18 0600  meropenem (MERREM) 500 mg in sodium chloride 0.9 % 100 mL IVPB     500 mg 200 mL/hr over 30 Minutes Intravenous Daily 10/28/18 0017     10/27/18 2330  piperacillin-tazobactam (ZOSYN) IVPB 3.375 g     3.375 g 100 mL/hr over 30 Minutes Intravenous  Once 10/27/18 2316 10/28/18 0038       Assessment/Plan ESRD - HD HTN DM Chronic pain Anemia of chronic disease -  s/p 1 unit PRBCs 8/14  Abscess in right iliac fossa at the site of recent explantation of failed renal transplant at [facility] - IR consult pending for drain placement. Continue broad spectrum abx. Recommend NPO until seen by IR.  ID - merrem 8/15>> FEN - IVF, renal diet VTE - SCDs, sq heparin Foley - none Follow up - TBD   LOS: 0 days    Wellington Hampshire , [facility] 10/28/2018, 8:54 AM Pager: [phone] Mon-Thurs 7:00 am-4:30 pm Fri 7:00 am -11:30 AM Sat-Sun 7:00 am-11:30 am

## 2018-10-28 NOTE — Progress Notes (Signed)
Pharmacy Antibiotic Note  [name] is a 33 y.o. female admitted on 10/27/2018 with intraabdominal abcess.  Pharmacy has been consulted for vancomycin dosing. Febrile on admission, Tmax 100.5. WBC wnl.LA 1.2 Patient is ESRD with HD MWF.   Plan: Vancomycin 1750mg  x1  Vancomycin 750mg  IV after HD on MWF Monitor HD toleration and adjust vancomycin maintenance regimen as needed.  Check pre-dialysis vancomycin level on 8/21 or 8/24 (approximately 1 week) Continue meropenem 500mg  IV q24h Ensure meropenem is administered after HD on MWF Monitor Scr, C/S, and clinical progression    Temp (24hrs), Avg:99.9 F (37.7 C), Min:98.7 F (37.1 C), Max:100.5 F (38.1 C)  Recent Labs  Lab 10/26/18 1036 10/27/18 1840 10/28/18 0635  WBC  --  5.9 5.7  CREATININE 6.70* 3.69* 5.29*  LATICACIDVEN  --  1.2  --     Estimated Creatinine Clearance: 15.7 mL/min (A) (by C-G formula based on SCr of 5.29 mg/dL (H)).    Allergies  Allergen Reactions  . Benadryl [Diphenhydramine-Zinc Acetate] Shortness Of Breath  . Diphenhydramine Hcl Anaphylaxis    REACTION: Stopped breathing in ICU  . Doxycycline Shortness Of Breath  . Motrin [Ibuprofen] Shortness Of Breath and Itching    Per pt  . Banana Other (See Comments)    Sick on the stomach  . Iron Dextran Other (See Comments)    REACTION: vein irritation  . Shellfish Allergy Hives    Antimicrobials this admission: Meropenem 8/15 >>  Vancomycin 8/15 >>    Microbiology results: 8/15 MRSA PCR: sent 8/15 covid: negative   Thank you for allowing pharmacy to be a part of this patient's care.  Cristela Felt, PharmD PGY1 Pharmacy Resident [employer]: [phone]  10/28/2018 8:37 AM

## 2018-10-28 NOTE — Plan of Care (Signed)
  Problem: Education: Goal: Knowledge of General Education information will improve Description Including pain rating scale, medication(s)/side effects and non-pharmacologic comfort measures Outcome: Progressing   

## 2018-10-29 DIAGNOSIS — D631 Anemia in chronic kidney disease: Secondary | ICD-10-CM

## 2018-10-29 DIAGNOSIS — I10 Essential (primary) hypertension: Secondary | ICD-10-CM

## 2018-10-29 DIAGNOSIS — G8929 Other chronic pain: Secondary | ICD-10-CM

## 2018-10-29 DIAGNOSIS — Z992 Dependence on renal dialysis: Secondary | ICD-10-CM

## 2018-10-29 DIAGNOSIS — N186 End stage renal disease: Secondary | ICD-10-CM

## 2018-10-29 LAB — COMPREHENSIVE METABOLIC PANEL
ALT: 25 U/L (ref 0–44)
AST: 82 U/L — ABNORMAL HIGH (ref 15–41)
Albumin: 2.3 g/dL — ABNORMAL LOW (ref 3.5–5.0)
Alkaline Phosphatase: 86 U/L (ref 38–126)
Anion gap: 14 (ref 5–15)
BUN: 30 mg/dL — ABNORMAL HIGH (ref 6–20)
CO2: 22 mmol/L (ref 22–32)
Calcium: 7.8 mg/dL — ABNORMAL LOW (ref 8.9–10.3)
Chloride: 93 mmol/L — ABNORMAL LOW (ref 98–111)
Creatinine, Ser: 7.41 mg/dL — ABNORMAL HIGH (ref 0.44–1.00)
GFR calc Af Amer: 8 mL/min — ABNORMAL LOW (ref >60)
GFR calc non Af Amer: 6 mL/min — ABNORMAL LOW (ref >60)
Glucose, Bld: 456 mg/dL — ABNORMAL HIGH (ref 70–99)
Potassium: 4 mmol/L (ref 3.5–5.1)
Sodium: 129 mmol/L — ABNORMAL LOW (ref 135–145)
Total Bilirubin: 0.5 mg/dL (ref 0.3–1.2)
Total Protein: 6.4 g/dL — ABNORMAL LOW (ref 6.5–8.1)

## 2018-10-29 LAB — TYPE AND SCREEN
ABO/RH(D): A POS
Antibody Screen: NEGATIVE
Unit division: 0

## 2018-10-29 LAB — GLUCOSE, CAPILLARY
Glucose-Capillary: 428 mg/dL — ABNORMAL HIGH (ref 70–99)
Glucose-Capillary: 452 mg/dL — ABNORMAL HIGH (ref 70–99)
Glucose-Capillary: 310 mg/dL — ABNORMAL HIGH (ref 70–99)
Glucose-Capillary: 82 mg/dL (ref 70–99)
Glucose-Capillary: 124 mg/dL — ABNORMAL HIGH (ref 70–99)

## 2018-10-29 LAB — CBC WITH DIFFERENTIAL/PLATELET
Abs Immature Granulocytes: 0.06 10*3/uL (ref 0.00–0.07)
Basophils Absolute: 0 10*3/uL (ref 0.0–0.1)
Basophils Relative: 1 %
Eosinophils Absolute: 0.1 10*3/uL (ref 0.0–0.5)
Eosinophils Relative: 2 %
HCT: 24.3 % — ABNORMAL LOW (ref 36.0–46.0)
Hemoglobin: 7.4 g/dL — ABNORMAL LOW (ref 12.0–15.0)
Immature Granulocytes: 1 %
Lymphocytes Relative: 29 %
Lymphs Abs: 1.6 10*3/uL (ref 0.7–4.0)
MCH: 27.3 pg (ref 26.0–34.0)
MCHC: 30.5 g/dL (ref 30.0–36.0)
MCV: 89.7 fL (ref 80.0–100.0)
Monocytes Absolute: 0.5 10*3/uL (ref 0.1–1.0)
Monocytes Relative: 9 %
Neutro Abs: 3.2 10*3/uL (ref 1.7–7.7)
Neutrophils Relative %: 58 %
Platelets: 259 10*3/uL (ref 150–400)
RBC: 2.71 MIL/uL — ABNORMAL LOW (ref 3.87–5.11)
RDW: 15.8 % — ABNORMAL HIGH (ref 11.5–15.5)
WBC: 5.5 10*3/uL (ref 4.0–10.5)
nRBC: 0.9 % — ABNORMAL HIGH (ref 0.0–0.2)

## 2018-10-29 LAB — BPAM RBC
Blood Product Expiration Date: 202009122359
ISSUE DATE / TIME: 202008150013
Unit Type and Rh: 6200

## 2018-10-29 LAB — MAGNESIUM: Magnesium: 1.9 mg/dL (ref 1.7–2.4)

## 2018-10-29 MED ORDER — INSULIN ASPART 100 UNIT/ML ~~LOC~~ SOLN
12.0000 [IU] | Freq: Once | SUBCUTANEOUS | Status: AC
  Administered 2018-10-29: 12 [IU] via SUBCUTANEOUS

## 2018-10-29 MED ORDER — INSULIN ASPART 100 UNIT/ML ~~LOC~~ SOLN
10.0000 [IU] | Freq: Three times a day (TID) | SUBCUTANEOUS | Status: DC
  Administered 2018-10-29 – 2018-10-31 (×5): 10 [IU] via SUBCUTANEOUS

## 2018-10-29 MED ORDER — CALCIUM CARBONATE ANTACID 1250 MG/5ML PO SUSP
500.0000 mg | Freq: Two times a day (BID) | ORAL | Status: DC
  Administered 2018-10-29 – 2018-10-31 (×5): 500 mg via ORAL
  Filled 2018-10-29 (×6): qty 5

## 2018-10-29 MED ORDER — INSULIN GLARGINE 100 UNIT/ML ~~LOC~~ SOLN
25.0000 [IU] | Freq: Every day | SUBCUTANEOUS | Status: DC
  Administered 2018-10-30 – 2018-10-31 (×2): 25 [IU] via SUBCUTANEOUS
  Filled 2018-10-29 (×2): qty 0.25

## 2018-10-29 NOTE — Progress Notes (Signed)
Patient ID: [name], female   DOB: [birth year], 33 y.o.   MRN: [id number]  PROGRESS NOTE    KEYLA GOODLIN  [id number] DOB: 1986/02/02 DOA: 10/27/2018 PCP: [name], MD   Brief Narrative:  33 year old female with history of diabetes mellitus type 2, end-stage renal disease on hemodialysis who failed kidney transplant recently with removal of transplant about 2 months ago presented with abdominal pain.  She was found to have intra-abdominal abscess at the area where she had her transplanted kidney removed.  General surgery was consulted who recommended IR guided aspiration.  She was started on intravenous antibiotics.  Nephrology was consulted.  She underwent IR guided drain placement on 10/28/2018.  Assessment & Plan:   Intra-abdominal abscess -CT of the abdomen and pelvis showed intra-abdominal abscess at the area where she had her transplanted kidney removed -Currently on vancomycin and meropenem. -Status post IR guided aspiration and drain placement on 10/28/2018.  Gram stain showing few gram-positive cocci.  Follow cultures. -General surgery following as well.  End-stage renal disease on hemodialysis with history of failed previous renal transplant -Nephrology following.  Dialysis as per nephrology schedule.  Diabetes mellitus type 2 uncontrolled with hyperglycemia -Blood sugar still elevated.  Increase Lantus to 25 units daily.  Add NovoLog with meals.  Continue CBGs with SSI.  Anemia of chronic disease -From renal failure.  Hemoglobin 7.4 today.  Status post 1 unit packed red cells transfusion since admission.  Monitor  Hypertension -Blood pressure stable.  Continue amlodipine.  Chronic pain syndrome -Continue home regimen.   DVT prophylaxis: Heparin Code Status: Full Family Communication: None at bedside Disposition Plan: Home in 1 to 3 days once cleared by general surgery/IR and if clinically improves  Consultants: General surgery/IR/nephrology  Procedures:  IR guided drain placement on 10/28/2018  Antimicrobials:  Anti-infectives (From admission, onward)   Start     Dose/Rate Route Frequency Ordered Stop   10/30/18 1200  vancomycin (VANCOCIN) IVPB 750 mg/150 ml premix     750 mg 150 mL/hr over 60 Minutes Intravenous Every M-W-F (Hemodialysis) 10/28/18 0854     10/28/18 1000  vancomycin (VANCOCIN) 1,750 mg in sodium chloride 0.9 % 500 mL IVPB     1,750 mg 250 mL/hr over 120 Minutes Intravenous  Once 10/28/18 0854 10/28/18 1335   10/28/18 0600  meropenem (MERREM) 500 mg in sodium chloride 0.9 % 100 mL IVPB     500 mg 200 mL/hr over 30 Minutes Intravenous Daily 10/28/18 0017     10/27/18 2330  piperacillin-tazobactam (ZOSYN) IVPB 3.375 g     3.375 g 100 mL/hr over 30 Minutes Intravenous  Once 10/27/18 2316 10/28/18 0038       Subjective: Patient seen and examined at bedside.  She denies worsening abdominal pain.  Denies any overnight fever or vomiting.  Objective: Vitals:   10/28/18 1708 10/28/18 2057 10/29/18 0505 10/29/18 0839  BP: 104/66 106/84 105/69 100/71  Pulse: 92 94 85 90  Resp: 18 18 18 16   Temp: 98.2 F (36.8 C) 97.9 F (36.6 C) 98 F (36.7 C) 98 F (36.7 C)  TempSrc: Oral Oral Oral Oral  SpO2: 93% 97% 90% 93%    Intake/Output Summary (Last 24 hours) at 10/29/2018 1021 Last data filed at 10/29/2018 0900 Gross per 24 hour  Intake 865 ml  Output 50 ml  Net 815 ml   There were no vitals filed for this visit.  Examination:  General exam: Appears calm and comfortable  Respiratory system: Bilateral decreased  breath sounds at bases Cardiovascular system: S1 & S2 heard, Rate controlled Gastrointestinal system: Abdomen is nondistended, soft and mildly tender in the right lower quadrant with drain present. Normal bowel sounds heard. Extremities: No cyanosis, clubbing; trace edema Central nervous system: Alert and oriented. No focal neurological deficits. Moving extremities Skin: No rashes, lesions or  ulcers Psychiatry: Judgement and insight appear normal. Mood & affect appropriate.     Data Reviewed: I have personally reviewed following labs and imaging studies  CBC: Recent Labs  Lab 10/26/18 1036 10/27/18 1840 10/28/18 0635 10/29/18 0706  WBC  --  5.9 5.7 5.5  NEUTROABS  --  3.4  --  3.2  HGB 7.5* 6.9* 7.6* 7.4*  HCT 22.0* 23.7* 25.2* 24.3*  MCV  --  90.1 90.0 89.7  PLT  --  304 290 [id number]   Basic Metabolic Panel: Recent Labs  Lab 10/26/18 1036 10/27/18 1840 10/28/18 0635 10/29/18 0706  NA 129* 136 135 129*  K 4.7 3.6 3.7 4.0  CL 94* 96* 96* 93*  CO2  --  25 24 22   GLUCOSE 365* 190* 230* 456*  BUN 36* 9 13 30*  CREATININE 6.70* 3.69* 5.29* 7.41*  CALCIUM  --  9.9 9.1 7.8*  MG  --   --   --  1.9   GFR: Estimated Creatinine Clearance: 11.2 mL/min (A) (by C-G formula based on SCr of 7.41 mg/dL (H)). Liver Function Tests: Recent Labs  Lab 10/27/18 1840 10/28/18 0635 10/29/18 0706  AST 125* 178* 82*  ALT 30 37 25  ALKPHOS 108 92 86  BILITOT 1.0 1.2 0.5  PROT 7.2 6.8 6.4*  ALBUMIN 2.7* 2.5* 2.3*   No results for input(s): LIPASE, AMYLASE in the last 168 hours. No results for input(s): AMMONIA in the last 168 hours. Coagulation Profile: Recent Labs  Lab 10/28/18 0919  INR 1.3*   Cardiac Enzymes: No results for input(s): CKTOTAL, CKMB, CKMBINDEX, TROPONINI in the last 168 hours. BNP (last 3 results) No results for input(s): PROBNP in the last 8760 hours. HbA1C: No results for input(s): HGBA1C in the last 72 hours. CBG: Recent Labs  Lab 10/28/18 1115 10/28/18 1707 10/28/18 2109 10/29/18 0711 10/29/18 0838  GLUCAP 214* 334* 382* 428* 452*   Lipid Profile: No results for input(s): CHOL, HDL, LDLCALC, TRIG, CHOLHDL, LDLDIRECT in the last 72 hours. Thyroid Function Tests: No results for input(s): TSH, T4TOTAL, FREET4, T3FREE, THYROIDAB in the last 72 hours. Anemia Panel: No results for input(s): VITAMINB12, FOLATE, FERRITIN, TIBC, IRON,  RETICCTPCT in the last 72 hours. Sepsis Labs: Recent Labs  Lab 10/27/18 1840  LATICACIDVEN 1.2    Recent Results (from the past 240 hour(s))  SARS CORONAVIRUS 2 Nasal Swab Aptima Multi Swab     Status: None   Collection Time: 10/24/18 12:10 PM   Specimen: Aptima Multi Swab; Nasal Swab  Result Value Ref Range Status   SARS Coronavirus 2 NEGATIVE NEGATIVE Final    Comment: (NOTE) SARS-CoV-2 target nucleic acids are NOT DETECTED. The SARS-CoV-2 RNA is generally detectable in upper and lower respiratory specimens during the acute phase of infection. Negative results do not preclude SARS-CoV-2 infection, do not rule out co-infections with other pathogens, and should not be used as the sole basis for treatment or other patient management decisions. Negative results must be combined with clinical observations, patient history, and epidemiological information. The expected result is Negative. Fact Sheet for Patients: SugarRoll.be Fact Sheet for Healthcare Providers: https://www.woods-mathews.com/ This test is not yet approved or  cleared by the [place] and  has been authorized for detection and/or diagnosis of SARS-CoV-2 by FDA under an Emergency Use Authorization (EUA). This EUA will remain  in effect (meaning this test can be used) for the duration of the COVID-19 declaration under Section 56 4(b)(1) of the Act, 21 U.S.C. section 360bbb-3(b)(1), unless the authorization is terminated or revoked sooner. Performed at [facility], [place] [address] [place], [place] [address]   SARS Coronavirus 2 [facility] order, Performed in [facility] hospital lab) Nasopharyngeal Nasopharyngeal Swab     Status: None   Collection Time: 10/28/18  1:55 AM   Specimen: Nasopharyngeal Swab  Result Value Ref Range Status   SARS Coronavirus 2 NEGATIVE NEGATIVE Final    Comment: (NOTE) If result is NEGATIVE SARS-CoV-2 target nucleic acids are NOT  DETECTED. The SARS-CoV-2 RNA is generally detectable in upper and lower  respiratory specimens during the acute phase of infection. The lowest  concentration of SARS-CoV-2 viral copies this assay can detect is 250  copies / mL. A negative result does not preclude SARS-CoV-2 infection  and should not be used as the sole basis for treatment or other  patient management decisions.  A negative result may occur with  improper specimen collection / handling, submission of specimen other  than nasopharyngeal swab, presence of viral mutation(s) within the  areas targeted by this assay, and inadequate number of viral copies  (<250 copies / mL). A negative result must be combined with clinical  observations, patient history, and epidemiological information. If result is POSITIVE SARS-CoV-2 target nucleic acids are DETECTED. The SARS-CoV-2 RNA is generally detectable in upper and lower  respiratory specimens dur ing the acute phase of infection.  Positive  results are indicative of active infection with SARS-CoV-2.  Clinical  correlation with patient history and other diagnostic information is  necessary to determine patient infection status.  Positive results do  not rule out bacterial infection or co-infection with other viruses. If result is PRESUMPTIVE POSTIVE SARS-CoV-2 nucleic acids MAY BE PRESENT.   A presumptive positive result was obtained on the submitted specimen  and confirmed on repeat testing.  While 2019 novel coronavirus  (SARS-CoV-2) nucleic acids may be present in the submitted sample  additional confirmatory testing may be necessary for epidemiological  and / or clinical management purposes  to differentiate between  SARS-CoV-2 and other Sarbecovirus currently known to infect humans.  If clinically indicated additional testing with an alternate test  methodology [phone]) is advised. The SARS-CoV-2 RNA is generally  detectable in upper and lower respiratory sp ecimens during  the acute  phase of infection. The expected result is Negative. Fact Sheet for Patients:  StrictlyIdeas.no Fact Sheet for Healthcare Providers: BankingDealers.co.za This test is not yet approved or cleared by the [place] FDA and has been authorized for detection and/or diagnosis of SARS-CoV-2 by FDA under an Emergency Use Authorization (EUA).  This EUA will remain in effect (meaning this test can be used) for the duration of the COVID-19 declaration under Section 564(b)(1) of the Act, 21 U.S.C. section 360bbb-3(b)(1), unless the authorization is terminated or revoked sooner. Performed at [facility], [place] [address], [place], [place] [address]   MRSA PCR Screening     Status: None   Collection Time: 10/28/18  6:51 AM   Specimen: Nasopharyngeal  Result Value Ref Range Status   MRSA by PCR NEGATIVE NEGATIVE Final    Comment:        The  GeneXpert MRSA Assay (FDA approved for NASAL specimens only), is one component of a comprehensive MRSA colonization surveillance program. It is not intended to diagnose MRSA infection nor to guide or monitor treatment for MRSA infections. Performed at [facility], [place] [address], [place], [place] [address]   Aerobic/Anaerobic Culture (surgical/deep wound)     Status: None (Preliminary result)   Collection Time: 10/28/18  1:35 PM   Specimen: Abscess  Result Value Ref Range Status   Specimen Description ABSCESS  Final   Special Requests NONE  Final   Gram Stain   Final    ABUNDANT WBC PRESENT, PREDOMINANTLY PMN FEW GRAM POSITIVE COCCI Performed at [facility], [place] [address] [place], [place] [address]    Culture PENDING  Incomplete   Report Status PENDING  Incomplete         Radiology Studies: Dg Chest 2 View  Result Date: 10/27/2018 CLINICAL DATA:  Patient felt a seizure developing during dialysis today. EXAM: CHEST - 2 VIEW COMPARISON:  Chest x-rays dated  09/01/2018 and 05/01/2018. FINDINGS: Stable cardiomegaly. Overall cardiomediastinal silhouette is stable. Dialysis catheter appears stable in position with tip overlying the RIGHT atrium. Stable streaky opacities at the RIGHT lung base, likely atelectasis. No new lung findings. No pleural effusion or pneumothorax seen. Osseous structures about the chest are unremarkable. IMPRESSION: No active cardiopulmonary disease. Stable cardiomegaly. Stable atelectasis at the RIGHT lung base. No evidence of pneumonia or pulmonary edema. Electronically Signed   By: Franki Cabot M.D.   On: 10/27/2018 19:21   Ct Abdomen Pelvis W Contrast  Result Date: 10/27/2018 CLINICAL DATA:  Acute generalized abdominal pain. 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 EXAM: CT ABDOMEN AND PELVIS WITH CONTRAST TECHNIQUE: Multidetector CT imaging of the abdomen and pelvis was performed using the standard protocol following bolus administration of intravenous contrast. CONTRAST:  147mL OMNIPAQUE IOHEXOL 300 MG/ML  SOLN COMPARISON:  09/17/2018, additional priors. FINDINGS: Lower chest: Central catheter tip at the atrial caval junction. Increased lung base atelectasis from prior exam. Hepatobiliary: No focal liver abnormality is seen. Status post cholecystectomy. No biliary dilatation. Pancreas: No ductal dilatation or inflammation. Spleen: Upper normal in size without focal abnormality. Adrenals/Urinary Tract: Normal adrenal glands. Chronically atrophic left native kidney. The right kidney not visualized. In the location of the previous right iliac fossa renal transplant is a heterogeneous air-fluid collection with peripheral enhancement measuring 11.3 x 5.9 x 6.4 cm. Urinary bladder is nondistended and not well evaluated. Stomach/Bowel: Bowel evaluation is limited in the absence of enteric contrast. Stomach is decompressed. No evidence of bowel obstruction, wall thickening or inflammation. Appendix tentatively identified  and normal. Small volume of stool in the colon. Vascular/Lymphatic: Normal caliber abdominal aorta. The portal vein is patent. There multiple prominent retroperitoneal lymph nodes that are similar to prior exam. Enlarged right external iliac node measuring 13 mm short axis is adjacent to prior renal transplant. Additional prominent lymph nodes in the right inguinal region. Reproductive: Uterus not visualized.  No adnexal masses. Other: Recent midline and right lower abdominal wound. Mild edema and minimal scattered fluid along the incision site, but no drainable subcutaneous fluid collection. Trace perihepatic free fluid. No free air. Musculoskeletal: No acute osseous abnormalities. Suggestion of mild renal osteodystrophy. IMPRESSION: 1. Heterogeneous air-fluid collection in the location of the previous right iliac fossa renal transplant measuring 11.3 x 5.9 x 6.4 cm, suspicious for abscess. 2. Enlarged right external iliac and right inguinal lymph nodes are likely reactive.  3. Recent midline and right lower abdominal surgical wound. Mild edema and minimal scattered fluid along the incision site, but no drainable subcutaneous fluid collection. Electronically Signed   By: Keith Rake M.D.   On: 10/27/2018 23:06   Ir Guided Niel Hummer W Catheter Placement  Result Date: 10/28/2018 INDICATION: 33 year old with end-stage renal disease. Patient had a transplant kidney removed from the right lower quadrant. Patient presents with abdominal pain and found to have a complex fluid collection at the old transplant site. EXAM: ULTRASOUND-GUIDED PLACEMENT OF A DRAINAGE CATHETER WITHIN THE RIGHT LOWER QUADRANT ABDOMINAL FLUID COLLECTION MEDICATIONS: Moderate sedation ANESTHESIA/SEDATION: Fentanyl 75 mcg IV; Versed 1.0 mg IV Moderate Sedation Time:  10 minutes The patient was continuously monitored during the procedure by the interventional radiology nurse under my direct supervision. COMPLICATIONS: None immediate. PROCEDURE:  Informed written consent was obtained from the patient after a thorough discussion of the procedural risks, benefits and alternatives. All questions were addressed. Maximal Sterile Barrier Technique was utilized including caps, mask, sterile gowns, sterile gloves, sterile drape, hand hygiene and skin antiseptic. A timeout was performed prior to the initiation of the procedure. Ultrasound was used to identify the complex fluid collection in the right lower quadrant of the abdomen. The overlying skin was prepped with chlorhexidine and sterile field was created. Skin and soft tissues were anesthetized with 1% lidocaine. Using ultrasound guidance, 18 gauge trocar needle was directed into the complex fluid collection and cloudy red fluid was aspirated. A stiff Amplatz wire was advanced into the collection. The tract was dilated to accommodate a 12 [place] multipurpose drain. Greater than 60 mL of cloudy red fluid was removed. The tube was slightly manipulated within the collection using ultrasound guidance in order to obtain more fluid. Catheter was sutured to skin and attached to a suction bulb. FINDINGS: Complex fluid collection in the right lower quadrant the abdomen with echogenic foci. Findings are compatible with air-fluid collection seen on the recent CT. Cloudy red fluid was removed from the collection, greater than 60 mL. Appearance of the fluid raises concern for an infected hematoma. Fluid was sent for culture. IMPRESSION: Ultrasound-guided placement of a drainage catheter within the right lower quadrant fluid collection. Cloudy red fluid was removed and sent for culture. Electronically Signed   By: Markus Daft M.D.   On: 10/28/2018 13:48        Scheduled Meds:  sodium chloride   Intravenous Once   amLODipine  10 mg Oral Daily   dicyclomine  20 mg Oral BID   famotidine  40 mg Oral Daily   gabapentin  300 mg Oral BID   heparin  5,000 Units Subcutaneous Q8H   insulin aspart  0-5 Units  Subcutaneous QHS   insulin aspart  0-9 Units Subcutaneous TID WC   insulin glargine  20 Units Subcutaneous Daily   loratadine  10 mg Oral Daily   metoCLOPramide  10 mg Oral TID AC   multivitamin  1 tablet Oral QHS   pantoprazole  40 mg Oral BID   predniSONE  10 mg Oral Q breakfast   sevelamer carbonate  1.6 g Oral TID WC   simvastatin  20 mg Oral QHS   sodium chloride flush  5 mL Intracatheter Q8H   Continuous Infusions:  meropenem (MERREM) IV 500 mg (10/29/18 0915)   [START ON 10/30/2018] vancomycin       LOS: 1 day        [name], MD [facility] 10/29/2018, 10:21 AM

## 2018-10-29 NOTE — Progress Notes (Addendum)
[facility] Progress Note   Subjective:  S/p IR drain placement yesterday >38mL cloudy fluid removed Afebrile. More alert today and feeling much better  Tolerating PO, asking about going home      Objective Vitals:   10/28/18 1708 10/28/18 2057 10/29/18 0505 10/29/18 0839  BP: 104/66 106/84 105/69 100/71  Pulse: 92 94 85 90  Resp: 18 18 18 16   Temp: 98.2 F (36.8 C) 97.9 F (36.6 C) 98 F (36.7 C) 98 F (36.7 C)  TempSrc: Oral Oral Oral Oral  SpO2: 93% 97% 90% 93%    Physical Exam General: WNWD female NAD Heart: RRR Lungs: CTAB  Abdomen: BS+ soft NTND, RLQ wound bandaged  Extremities: No LE edema  Dialysis Access: R IJ TDC/ LUE 1st stage BVT   Weight change:    Additional Objective Labs: Basic Metabolic Panel: Recent Labs  Lab 10/27/18 1840 10/28/18 0635 10/29/18 0706  NA 136 135 129*  K 3.6 3.7 4.0  CL 96* 96* 93*  CO2 25 24 22   GLUCOSE 190* 230* 456*  BUN 9 13 30*  CREATININE 3.69* 5.29* 7.41*  CALCIUM 9.9 9.1 7.8*   CBC: Recent Labs  Lab 10/27/18 1840 10/28/18 0635 10/29/18 0706  WBC 5.9 5.7 5.5  NEUTROABS 3.4  --  3.2  HGB 6.9* 7.6* 7.4*  HCT 23.7* 25.2* 24.3*  MCV 90.1 90.0 89.7  PLT 304 290 259   Blood Culture    Component Value Date/Time   SDES ABSCESS 10/28/2018 1335   SPECREQUEST NONE 10/28/2018 1335   CULT PENDING 10/28/2018 1335   REPTSTATUS PENDING 10/28/2018 1335     Medications: . meropenem (MERREM) IV 500 mg (10/29/18 0915)  . [START ON 10/30/2018] vancomycin     . sodium chloride   Intravenous Once  . amLODipine  10 mg Oral Daily  . dicyclomine  20 mg Oral BID  . famotidine  40 mg Oral Daily  . gabapentin  300 mg Oral BID  . heparin  5,000 Units Subcutaneous Q8H  . insulin aspart  0-5 Units Subcutaneous QHS  . insulin aspart  0-9 Units Subcutaneous TID WC  . insulin glargine  20 Units Subcutaneous Daily  . loratadine  10 mg Oral Daily  . metoCLOPramide  10 mg Oral TID AC  . multivitamin  1 tablet  Oral QHS  . pantoprazole  40 mg Oral BID  . predniSONE  10 mg Oral Q breakfast  . sevelamer carbonate  1.6 g Oral TID WC  . simvastatin  20 mg Oral QHS  . sodium chloride flush  5 mL Intracatheter Q8H    Dialysis Orders:  [facility] MWF  4H BFR 350/800 EDW 76.5kg 2K/3.5Ca UFP 4 TDC Hep bolus 4000 + 2000 midrun Mircera 225 IV q 2 weeks (dosed 8/10)   Assessment/Plan: 1. Abd pain/fevers  s/p recent transplant nephrectomy. Abd CT showing air-fluid collection in RLQ site of previous transplant concerning for intra-abdominal abscess. S/p IR drain of abscess  -cultures pending.  On Vancomycin +meropenem  per pharmacy.  2. ESRD -  MWF HD. No urgent indications for HD today . Continue on schedule. Next HD 8/17 3. Hypertension/volume  - BP controlled. No volume excess on exam.  4. Anemia  - Hgb 7.4 s/p 1 unit prbc on 8/14.  On max ESA as outpatient. Follow trends.  5. Metabolic bone disease -  Using Added Ca++ bath as outpatient, follow trends. Renvela binders + Tums as outpatient.  6. Nutrition - Renal diet, supplements when eating  7. DMType 1- Insulin per primary    Lynnda Child PA-C [place] Pager [phone] 10/29/2018,9:53 AM  LOS: 1 day

## 2018-10-29 NOTE — Progress Notes (Signed)
Referring Physician(s): Tsuei,M  Supervising Physician: Markus Daft  Patient Status:  [name] - In-pt  Chief Complaint: Abdominal pain   Subjective: Pt feeling a little better since RLQ drain placed yesterday; denies N/V; wanting to go home   Allergies: Benadryl [diphenhydramine-zinc acetate], Diphenhydramine hcl, Doxycycline, Motrin [ibuprofen], Banana, Iron dextran, and Shellfish allergy  Medications: Prior to Admission medications   Medication Sig Start Date End Date Taking? Authorizing Provider  acetaminophen (TYLENOL) 500 MG tablet Take 1,000 mg by mouth daily as needed for moderate pain.    Yes [provider]  amLODipine (NORVASC) 10 MG tablet Take 1 tablet (10 mg total) by mouth daily. 03/25/18  Yes Thurnell Lose, MD  b complex-vitamin c-folic acid (NEPHRO-VITE) 0.8 MG TABS tablet Take 1 tablet by mouth every Monday, Wednesday, and Friday. After dialysis on dialysis days 07/19/18  Yes [provider]  cetirizine (ZYRTEC) 10 MG tablet Take 10 mg by mouth daily as needed for allergies.   Yes [provider]  dicyclomine (BENTYL) 20 MG tablet Take 1 tablet (20 mg total) by mouth 2 (two) times daily. 02/06/18  Yes [name], Ripudeep K, MD  famotidine (PEPCID) 40 MG tablet Take 40 mg by mouth daily.    Yes [provider]  fluticasone (FLONASE) 50 MCG/ACT nasal spray Place 2 sprays into both nostrils daily as needed for allergies. 09/03/18  Yes [name], MD  gabapentin (NEURONTIN) 300 MG capsule Take 300 mg by mouth 2 (two) times daily.    Yes [provider]  hydrOXYzine (ATARAX/VISTARIL) 50 MG tablet Take 1 tablet (50 mg total) by mouth 2 (two) times daily as needed for itching. 02/06/18  Yes [name], Ripudeep K, MD  insulin aspart (NOVOLOG FLEXPEN) 100 UNIT/ML FlexPen Inject 12-15 Units into the skin See admin instructions. 5-6 times per day as needed for blood sugar management   Yes [provider]  insulin degludec (TRESIBA)  100 UNIT/ML SOPN FlexTouch Pen Inject 0.6 mLs (60 Units total) into the skin 2 (two) times daily. Reports taking 24 units QAM Patient taking differently: Inject 20 Units into the skin daily.  03/06/18  Yes Charlynne Cousins, MD  loperamide (IMODIUM) 2 MG capsule Take 2 mg by mouth as needed for diarrhea or loose stools.    Yes [provider]  metoCLOPramide (REGLAN) 10 MG tablet Take 1 tablet (10 mg total) by mouth 3 (three) times daily before meals. 02/06/18  Yes [name], Ripudeep K, MD  oxyCODONE (ROXICODONE) 5 MG immediate release tablet Take 1 tablet (5 mg total) by mouth every 6 (six) hours as needed for severe pain. 10/26/18  Yes [name], PA-C  pantoprazole (PROTONIX) 40 MG tablet Take 40 mg by mouth 2 (two) times daily.   Yes [provider]  predniSONE (DELTASONE) 10 MG tablet Take 1 tablet (10 mg total) by mouth every morning. 03/06/18  Yes Charlynne Cousins, MD  promethazine (PHENERGAN) 25 MG tablet Take 1 tablet (25 mg total) by mouth every 6 (six) hours as needed for nausea or vomiting. 02/06/18  Yes [name], Ripudeep K, MD  sevelamer carbonate (RENVELA) 0.8 g PACK packet Take 2 packets by mouth 3 (three) times daily with meals. 10/09/18  Yes [provider]  simvastatin (ZOCOR) 20 MG tablet Take 20 mg by mouth at bedtime.    Yes [provider]  ACCU-CHEK SOFTCLIX LANCETS lancets Use to check blood sugar 4 times per day. 12/29/15   Renato Shin, MD     Vital  Signs: BP 100/71 (BP Location: Right Arm)    Pulse 90    Temp 98 F (36.7 C) (Oral)    Resp 16    LMP  (LMP Unknown) Comment: pt shielded   SpO2 93%   Physical Exam  Awake/alert; RLQ drain intact, dressing dry, site mildly tender, output 50 cc serosang fluid  Imaging: Dg Chest 2 View  Result Date: 10/27/2018 CLINICAL DATA:  Patient felt a seizure developing during dialysis today. EXAM: CHEST - 2 VIEW COMPARISON:  Chest x-rays dated 09/01/2018 and 05/01/2018. FINDINGS: Stable  cardiomegaly. Overall cardiomediastinal silhouette is stable. Dialysis catheter appears stable in position with tip overlying the RIGHT atrium. Stable streaky opacities at the RIGHT lung base, likely atelectasis. No new lung findings. No pleural effusion or pneumothorax seen. Osseous structures about the chest are unremarkable. IMPRESSION: No active cardiopulmonary disease. Stable cardiomegaly. Stable atelectasis at the RIGHT lung base. No evidence of pneumonia or pulmonary edema. Electronically Signed   By: Franki Cabot M.D.   On: 10/27/2018 19:21   Ct Abdomen Pelvis W Contrast  Result Date: 10/27/2018 CLINICAL DATA:  Acute generalized abdominal pain. 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 EXAM: CT ABDOMEN AND PELVIS WITH CONTRAST TECHNIQUE: Multidetector CT imaging of the abdomen and pelvis was performed using the standard protocol following bolus administration of intravenous contrast. CONTRAST:  187mL OMNIPAQUE IOHEXOL 300 MG/ML  SOLN COMPARISON:  09/17/2018, additional priors. FINDINGS: Lower chest: Central catheter tip at the atrial caval junction. Increased lung base atelectasis from prior exam. Hepatobiliary: No focal liver abnormality is seen. Status post cholecystectomy. No biliary dilatation. Pancreas: No ductal dilatation or inflammation. Spleen: Upper normal in size without focal abnormality. Adrenals/Urinary Tract: Normal adrenal glands. Chronically atrophic left native kidney. The right kidney not visualized. In the location of the previous right iliac fossa renal transplant is a heterogeneous air-fluid collection with peripheral enhancement measuring 11.3 x 5.9 x 6.4 cm. Urinary bladder is nondistended and not well evaluated. Stomach/Bowel: Bowel evaluation is limited in the absence of enteric contrast. Stomach is decompressed. No evidence of bowel obstruction, wall thickening or inflammation. Appendix tentatively identified and normal. Small volume of stool in the  colon. Vascular/Lymphatic: Normal caliber abdominal aorta. The portal vein is patent. There multiple prominent retroperitoneal lymph nodes that are similar to prior exam. Enlarged right external iliac node measuring 13 mm short axis is adjacent to prior renal transplant. Additional prominent lymph nodes in the right inguinal region. Reproductive: Uterus not visualized.  No adnexal masses. Other: Recent midline and right lower abdominal wound. Mild edema and minimal scattered fluid along the incision site, but no drainable subcutaneous fluid collection. Trace perihepatic free fluid. No free air. Musculoskeletal: No acute osseous abnormalities. Suggestion of mild renal osteodystrophy. IMPRESSION: 1. Heterogeneous air-fluid collection in the location of the previous right iliac fossa renal transplant measuring 11.3 x 5.9 x 6.4 cm, suspicious for abscess. 2. Enlarged right external iliac and right inguinal lymph nodes are likely reactive. 3. Recent midline and right lower abdominal surgical wound. Mild edema and minimal scattered fluid along the incision site, but no drainable subcutaneous fluid collection. Electronically Signed   By: Keith Rake M.D.   On: 10/27/2018 23:06   Ir Guided Niel Hummer W Catheter Placement  Result Date: 10/28/2018 INDICATION: 33 year old with end-stage renal disease. Patient had a transplant kidney removed from the right lower quadrant. Patient presents with abdominal pain and found to have a complex fluid collection at the old transplant site. EXAM: ULTRASOUND-GUIDED PLACEMENT OF  A DRAINAGE CATHETER WITHIN THE RIGHT LOWER QUADRANT ABDOMINAL FLUID COLLECTION MEDICATIONS: Moderate sedation ANESTHESIA/SEDATION: Fentanyl 75 mcg IV; Versed 1.0 mg IV Moderate Sedation Time:  10 minutes The patient was continuously monitored during the procedure by the interventional radiology nurse under my direct supervision. COMPLICATIONS: None immediate. PROCEDURE: Informed written consent was obtained from  the patient after a thorough discussion of the procedural risks, benefits and alternatives. All questions were addressed. Maximal Sterile Barrier Technique was utilized including caps, mask, sterile gowns, sterile gloves, sterile drape, hand hygiene and skin antiseptic. A timeout was performed prior to the initiation of the procedure. Ultrasound was used to identify the complex fluid collection in the right lower quadrant of the abdomen. The overlying skin was prepped with chlorhexidine and sterile field was created. Skin and soft tissues were anesthetized with 1% lidocaine. Using ultrasound guidance, 18 gauge trocar needle was directed into the complex fluid collection and cloudy red fluid was aspirated. A stiff Amplatz wire was advanced into the collection. The tract was dilated to accommodate a 12 [place] multipurpose drain. Greater than 60 mL of cloudy red fluid was removed. The tube was slightly manipulated within the collection using ultrasound guidance in order to obtain more fluid. Catheter was sutured to skin and attached to a suction bulb. FINDINGS: Complex fluid collection in the right lower quadrant the abdomen with echogenic foci. Findings are compatible with air-fluid collection seen on the recent CT. Cloudy red fluid was removed from the collection, greater than 60 mL. Appearance of the fluid raises concern for an infected hematoma. Fluid was sent for culture. IMPRESSION: Ultrasound-guided placement of a drainage catheter within the right lower quadrant fluid collection. Cloudy red fluid was removed and sent for culture. Electronically Signed   By: Markus Daft M.D.   On: 10/28/2018 13:48    Labs:  CBC: Recent Labs    09/29/18 1714  10/26/18 1036 10/27/18 1840 10/28/18 0635 10/29/18 0706  WBC 9.5  --   --  5.9 5.7 5.5  HGB 9.3*   < > 7.5* 6.9* 7.6* 7.4*  HCT 30.8*   < > 22.0* 23.7* 25.2* 24.3*  PLT 324  --   --  304 290 259   < > = values in this interval not displayed.     COAGS: Recent Labs    10/28/18 0919  INR 1.3*    BMP: Recent Labs    09/29/18 1714  10/26/18 1036 10/27/18 1840 10/28/18 0635 10/29/18 0706  NA 133*   < > 129* 136 135 129*  K 3.5   < > 4.7 3.6 3.7 4.0  CL 98   < > 94* 96* 96* 93*  CO2 22  --   --  25 24 22   GLUCOSE 195*   < > 365* 190* 230* 456*  BUN 22*   < > 36* 9 13 30*  CALCIUM 9.6  --   --  9.9 9.1 7.8*  CREATININE 5.48*   < > 6.70* 3.69* 5.29* 7.41*  GFRNONAA 9*  --   --  15* 10* 6*  GFRAA 11*  --   --  17* 11* 8*   < > = values in this interval not displayed.    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  She now presents with abdominal pain, fever and CT findings of an air-fluid collection in the location of the previously transplanted kidney in the right iliac fossa suspicious for abscess; s/p RLQ drain placement 8/15; afebrile; creat 7.41(5.29), hgb 7.4(7.6), WBC nl; drain fluid cx pend; cont drain flushing/output monitoring; check f/u CT once output < 10 cc/24hrs x 2 days (excluding flush amt); if pt goes home with drain will need to flush once a day with 3-5 cc sterile NS, record output and change dressing daily; she can be scheduled for f/u in IR drain clinic after discharge  Electronically Signed: D. Rowe Robert, PA-C 10/29/2018, 10:39 AM   I spent a total of 15 minutes at the the patient's bedside AND on the patient's [name] floor or unit, greater than 50% of which was counseling/coordinating care for right lower abdominal abscess drain    Patient ID: [name], female    DOB: 06-Apr-1985, 33 y.o.   MRN: [id number]

## 2018-10-29 NOTE — Plan of Care (Signed)
  Problem: Clinical Measurements: Goal: Respiratory complications will improve Outcome: Progressing   

## 2018-10-29 NOTE — Progress Notes (Signed)
[facility] Progress Note     Subjective: CC-  S/p IR drain placement yesterday with 60cc cloudy red fluid removed. Feeling ok this morning. Still drowsy but more interactive than yesterday. Denies abdominal pain. Denies n/v. Tolerating diet. BM yesterday. WBC 5.5, afebrile.  Lives at home alone, but states that her boyfriend is near and can help as needed.  States that she has had a drain in the past and had no issues taking care of it herself. Hopes to go home soon.  Objective: Vital signs in last 24 hours: Temp:  [97.9 F (36.6 C)-98.2 F (36.8 C)] 98 F (36.7 C) (08/16 0839) Pulse Rate:  [85-100] 90 (08/16 0839) Resp:  [16-19] 16 (08/16 0839) BP: (95-111)/(63-84) 100/71 (08/16 0839) SpO2:  [90 %-97 %] 93 % (08/16 0839) Last BM Date: 10/29/18  Intake/Output from previous day: 08/15 0701 - 08/16 0700 In: 625 [P.O.:120; IV Piggyback:500] Out: 50 [Drains:50] Intake/Output this shift: Total I/O In: 240 [P.O.:240] Out: 0   PE: Gen:  Alert, NAD HEENT: EOM's intact, pupils equal and round Pulm:  Rate and effort normal Abd: Soft, nondistended, +BS, no HSM, no hernia. Well healed midline and RLQ incisions. Abdomen nontender. Drain with cloudy/red fluid in bulb Psych: A&Ox3  Skin: no rashes noted, warm and dry  Lab Results:  Recent Labs    10/28/18 0635 10/29/18 0706  WBC 5.7 5.5  HGB 7.6* 7.4*  HCT 25.2* 24.3*  PLT 290 259   BMET Recent Labs    10/28/18 0635 10/29/18 0706  NA 135 129*  K 3.7 4.0  CL 96* 93*  CO2 24 22  GLUCOSE 230* 456*  BUN 13 30*  CREATININE 5.29* 7.41*  CALCIUM 9.1 7.8*   PT/INR Recent Labs    10/28/18 0919  LABPROT 16.2*  INR 1.3*   CMP     Component Value Date/Time   NA 129 (L) 10/29/2018 0706   K 4.0 10/29/2018 0706   CL 93 (L) 10/29/2018 0706   CO2 22 10/29/2018 0706   GLUCOSE 456 (H) 10/29/2018 0706   BUN 30 (H) 10/29/2018 0706   CREATININE 7.41 (H) 10/29/2018 0706   CALCIUM 7.8 (L) 10/29/2018 0706    CALCIUM 5.7 (LL) 03/20/2018 1915   PROT 6.4 (L) 10/29/2018 0706   ALBUMIN 2.3 (L) 10/29/2018 0706   AST 82 (H) 10/29/2018 0706   ALT 25 10/29/2018 0706   ALKPHOS 86 10/29/2018 0706   BILITOT 0.5 10/29/2018 0706   GFRNONAA 6 (L) 10/29/2018 0706   GFRAA 8 (L) 10/29/2018 0706   Lipase     Component Value Date/Time   LIPASE 43 09/17/2018 0417       Studies/Results: Dg Chest 2 View  Result Date: 10/27/2018 CLINICAL DATA:  Patient felt a seizure developing during dialysis today. EXAM: CHEST - 2 VIEW COMPARISON:  Chest x-rays dated 09/01/2018 and 05/01/2018. FINDINGS: Stable cardiomegaly. Overall cardiomediastinal silhouette is stable. Dialysis catheter appears stable in position with tip overlying the RIGHT atrium. Stable streaky opacities at the RIGHT lung base, likely atelectasis. No new lung findings. No pleural effusion or pneumothorax seen. Osseous structures about the chest are unremarkable. IMPRESSION: No active cardiopulmonary disease. Stable cardiomegaly. Stable atelectasis at the RIGHT lung base. No evidence of pneumonia or pulmonary edema. Electronically Signed   By: Franki Cabot M.D.   On: 10/27/2018 19:21   Ct Abdomen Pelvis W Contrast  Result Date: 10/27/2018 CLINICAL DATA:  Acute generalized abdominal pain. 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  EXAM: CT ABDOMEN AND PELVIS WITH CONTRAST TECHNIQUE: Multidetector CT imaging of the abdomen and pelvis was performed using the standard protocol following bolus administration of intravenous contrast. CONTRAST:  132mL OMNIPAQUE IOHEXOL 300 MG/ML  SOLN COMPARISON:  09/17/2018, additional priors. FINDINGS: Lower chest: Central catheter tip at the atrial caval junction. Increased lung base atelectasis from prior exam. Hepatobiliary: No focal liver abnormality is seen. Status post cholecystectomy. No biliary dilatation. Pancreas: No ductal dilatation or inflammation. Spleen: Upper normal in size without focal  abnormality. Adrenals/Urinary Tract: Normal adrenal glands. Chronically atrophic left native kidney. The right kidney not visualized. In the location of the previous right iliac fossa renal transplant is a heterogeneous air-fluid collection with peripheral enhancement measuring 11.3 x 5.9 x 6.4 cm. Urinary bladder is nondistended and not well evaluated. Stomach/Bowel: Bowel evaluation is limited in the absence of enteric contrast. Stomach is decompressed. No evidence of bowel obstruction, wall thickening or inflammation. Appendix tentatively identified and normal. Small volume of stool in the colon. Vascular/Lymphatic: Normal caliber abdominal aorta. The portal vein is patent. There multiple prominent retroperitoneal lymph nodes that are similar to prior exam. Enlarged right external iliac node measuring 13 mm short axis is adjacent to prior renal transplant. Additional prominent lymph nodes in the right inguinal region. Reproductive: Uterus not visualized.  No adnexal masses. Other: Recent midline and right lower abdominal wound. Mild edema and minimal scattered fluid along the incision site, but no drainable subcutaneous fluid collection. Trace perihepatic free fluid. No free air. Musculoskeletal: No acute osseous abnormalities. Suggestion of mild renal osteodystrophy. IMPRESSION: 1. Heterogeneous air-fluid collection in the location of the previous right iliac fossa renal transplant measuring 11.3 x 5.9 x 6.4 cm, suspicious for abscess. 2. Enlarged right external iliac and right inguinal lymph nodes are likely reactive. 3. Recent midline and right lower abdominal surgical wound. Mild edema and minimal scattered fluid along the incision site, but no drainable subcutaneous fluid collection. Electronically Signed   By: Keith Rake M.D.   On: 10/27/2018 23:06   Ir Guided Niel Hummer W Catheter Placement  Result Date: 10/28/2018 INDICATION: 33 year old with end-stage renal disease. Patient had a transplant kidney  removed from the right lower quadrant. Patient presents with abdominal pain and found to have a complex fluid collection at the old transplant site. EXAM: ULTRASOUND-GUIDED PLACEMENT OF A DRAINAGE CATHETER WITHIN THE RIGHT LOWER QUADRANT ABDOMINAL FLUID COLLECTION MEDICATIONS: Moderate sedation ANESTHESIA/SEDATION: Fentanyl 75 mcg IV; Versed 1.0 mg IV Moderate Sedation Time:  10 minutes The patient was continuously monitored during the procedure by the interventional radiology nurse under my direct supervision. COMPLICATIONS: None immediate. PROCEDURE: Informed written consent was obtained from the patient after a thorough discussion of the procedural risks, benefits and alternatives. All questions were addressed. Maximal Sterile Barrier Technique was utilized including caps, mask, sterile gowns, sterile gloves, sterile drape, hand hygiene and skin antiseptic. A timeout was performed prior to the initiation of the procedure. Ultrasound was used to identify the complex fluid collection in the right lower quadrant of the abdomen. The overlying skin was prepped with chlorhexidine and sterile field was created. Skin and soft tissues were anesthetized with 1% lidocaine. Using ultrasound guidance, 18 gauge trocar needle was directed into the complex fluid collection and cloudy red fluid was aspirated. A stiff Amplatz wire was advanced into the collection. The tract was dilated to accommodate a 12 [place] multipurpose drain. Greater than 60 mL of cloudy red fluid was removed. The tube was slightly manipulated within the collection using ultrasound  guidance in order to obtain more fluid. Catheter was sutured to skin and attached to a suction bulb. FINDINGS: Complex fluid collection in the right lower quadrant the abdomen with echogenic foci. Findings are compatible with air-fluid collection seen on the recent CT. Cloudy red fluid was removed from the collection, greater than 60 mL. Appearance of the fluid raises concern for  an infected hematoma. Fluid was sent for culture. IMPRESSION: Ultrasound-guided placement of a drainage catheter within the right lower quadrant fluid collection. Cloudy red fluid was removed and sent for culture. Electronically Signed   By: Markus Daft M.D.   On: 10/28/2018 13:48    Anti-infectives: Anti-infectives (From admission, onward)   Start     Dose/Rate Route Frequency Ordered Stop   10/30/18 1200  vancomycin (VANCOCIN) IVPB 750 mg/150 ml premix     750 mg 150 mL/hr over 60 Minutes Intravenous Every M-W-F (Hemodialysis) 10/28/18 0854     10/28/18 1000  vancomycin (VANCOCIN) 1,750 mg in sodium chloride 0.9 % 500 mL IVPB     1,750 mg 250 mL/hr over 120 Minutes Intravenous  Once 10/28/18 0854 10/28/18 1335   10/28/18 0600  meropenem (MERREM) 500 mg in sodium chloride 0.9 % 100 mL IVPB     500 mg 200 mL/hr over 30 Minutes Intravenous Daily 10/28/18 0017     10/27/18 2330  piperacillin-tazobactam (ZOSYN) IVPB 3.375 g     3.375 g 100 mL/hr over 30 Minutes Intravenous  Once 10/27/18 2316 10/28/18 0038       Assessment/Plan ESRD - HD MWF HTN DM Chronic pain Anemia of chronic disease - Hgb 7.4 s/p 1 unit prbc on 8/14   Abscess in right iliac fossa at the site of recent explantation of failed renal transplant at [facility] - S/p IR drain placement 8/15 - drain with cloudy/red fluid. Culture pending - Continue drain and antibiotics. Patient should follow up with her original surgeon once stable for discharge.  ID - merrem 8/15>>, vancomycin 8/15>> FEN - IVF, renal/CM diet VTE - SCDs, sq heparin Foley - none Follow up - TBD    LOS: 1 day    Wellington Hampshire , [facility] 10/29/2018, 10:24 AM Pager: [phone] Mon-Thurs 7:00 am-4:30 pm Fri 7:00 am -11:30 AM Sat-Sun 7:00 am-11:30 am

## 2018-10-30 LAB — CBC WITH DIFFERENTIAL/PLATELET
Abs Immature Granulocytes: 0.17 10*3/uL — ABNORMAL HIGH (ref 0.00–0.07)
Basophils Absolute: 0 10*3/uL (ref 0.0–0.1)
Basophils Relative: 1 %
Eosinophils Absolute: 0.2 10*3/uL (ref 0.0–0.5)
Eosinophils Relative: 3 %
HCT: 25 % — ABNORMAL LOW (ref 36.0–46.0)
Hemoglobin: 7.5 g/dL — ABNORMAL LOW (ref 12.0–15.0)
Immature Granulocytes: 2 %
Lymphocytes Relative: 32 %
Lymphs Abs: 2.4 10*3/uL (ref 0.7–4.0)
MCH: 27.2 pg (ref 26.0–34.0)
MCHC: 30 g/dL (ref 30.0–36.0)
MCV: 90.6 fL (ref 80.0–100.0)
Monocytes Absolute: 0.4 10*3/uL (ref 0.1–1.0)
Monocytes Relative: 6 %
Neutro Abs: 4.2 10*3/uL (ref 1.7–7.7)
Neutrophils Relative %: 56 %
Platelets: 272 10*3/uL (ref 150–400)
RBC: 2.76 MIL/uL — ABNORMAL LOW (ref 3.87–5.11)
RDW: 15.9 % — ABNORMAL HIGH (ref 11.5–15.5)
WBC: 7.4 10*3/uL (ref 4.0–10.5)
nRBC: 0.5 % — ABNORMAL HIGH (ref 0.0–0.2)

## 2018-10-30 LAB — MAGNESIUM: Magnesium: 2 mg/dL (ref 1.7–2.4)

## 2018-10-30 LAB — OCCULT BLOOD, POC DEVICE: Fecal Occult Bld: NEGATIVE

## 2018-10-30 LAB — GLUCOSE, CAPILLARY
Glucose-Capillary: 164 mg/dL — ABNORMAL HIGH (ref 70–99)
Glucose-Capillary: 178 mg/dL — ABNORMAL HIGH (ref 70–99)
Glucose-Capillary: 377 mg/dL — ABNORMAL HIGH (ref 70–99)
Glucose-Capillary: 272 mg/dL — ABNORMAL HIGH (ref 70–99)
Glucose-Capillary: 301 mg/dL — ABNORMAL HIGH (ref 70–99)
Glucose-Capillary: 114 mg/dL — ABNORMAL HIGH (ref 70–99)

## 2018-10-30 LAB — RENAL FUNCTION PANEL
Albumin: 2.2 g/dL — ABNORMAL LOW (ref 3.5–5.0)
Anion gap: 13 (ref 5–15)
BUN: 43 mg/dL — ABNORMAL HIGH (ref 6–20)
CO2: 22 mmol/L (ref 22–32)
Calcium: 7.1 mg/dL — ABNORMAL LOW (ref 8.9–10.3)
Chloride: 96 mmol/L — ABNORMAL LOW (ref 98–111)
Creatinine, Ser: 9.28 mg/dL — ABNORMAL HIGH (ref 0.44–1.00)
GFR calc Af Amer: 6 mL/min — ABNORMAL LOW (ref >60)
GFR calc non Af Amer: 5 mL/min — ABNORMAL LOW (ref >60)
Glucose, Bld: 365 mg/dL — ABNORMAL HIGH (ref 70–99)
Phosphorus: 2.5 mg/dL (ref 2.5–4.6)
Potassium: 3.4 mmol/L — ABNORMAL LOW (ref 3.5–5.1)
Sodium: 131 mmol/L — ABNORMAL LOW (ref 135–145)

## 2018-10-30 MED ORDER — VANCOMYCIN HCL IN DEXTROSE 750-5 MG/150ML-% IV SOLN
INTRAVENOUS | Status: AC
  Filled 2018-10-30: qty 150

## 2018-10-30 MED ORDER — HEPARIN SODIUM (PORCINE) 1000 UNIT/ML IJ SOLN
INTRAMUSCULAR | Status: AC
  Filled 2018-10-30: qty 4

## 2018-10-30 MED ORDER — SILVER SULFADIAZINE 1 % EX CREA
TOPICAL_CREAM | Freq: Two times a day (BID) | CUTANEOUS | Status: DC
  Administered 2018-10-30 – 2018-10-31 (×3): via TOPICAL
  Filled 2018-10-30: qty 85

## 2018-10-30 MED ORDER — HEPARIN SODIUM (PORCINE) 1000 UNIT/ML IJ SOLN
3.8000 mL | Freq: Once | INTRAMUSCULAR | Status: AC
  Administered 2018-10-30: 3800 [IU] via INTRAVENOUS

## 2018-10-30 NOTE — Progress Notes (Signed)
RN notified by Rapid Response of pt MEWs score of 2. Score was d/t HR and RR. RN recheck pt v/s and current HR-96 and RR-20. Pt is currently resting comfortably with no c/o pain or discomfort. RN will continue to monitor. Call light with in reach.     10/30/18 2110  Vitals  BP Location Right Arm  BP Method Automatic  Patient Position (if appropriate) Lying  Pulse Rate (!) 103  Resp 18

## 2018-10-30 NOTE — Progress Notes (Signed)
[facility] Progress Note  Subjective: no c/o today, ready to go home  Vitals:   10/30/18 0739 10/30/18 0744 10/30/18 0830 10/30/18 0900  BP: 109/80 117/85 (!) 131/92 (!) 122/92  Pulse: 80 85 89 91  Resp: 14 13 13 13   Temp:      TempSrc:      SpO2:      Weight:        Inpatient medications: . sodium chloride   Intravenous Once  . amLODipine  10 mg Oral Daily  . calcium carbonate (dosed in mg elemental calcium)  500 mg of elemental calcium Oral BID  . dicyclomine  20 mg Oral BID  . gabapentin  300 mg Oral BID  . heparin      . heparin  3.8 mL Intravenous Once  . heparin  5,000 Units Subcutaneous Q8H  . insulin aspart  0-5 Units Subcutaneous QHS  . insulin aspart  0-9 Units Subcutaneous TID WC  . insulin aspart  10 Units Subcutaneous TID WC  . insulin glargine  25 Units Subcutaneous Daily  . loratadine  10 mg Oral Daily  . metoCLOPramide  10 mg Oral TID AC  . multivitamin  1 tablet Oral QHS  . pantoprazole  40 mg Oral BID  . predniSONE  10 mg Oral Q breakfast  . sevelamer carbonate  1.6 g Oral TID WC  . simvastatin  20 mg Oral QHS  . sodium chloride flush  5 mL Intracatheter Q8H   . meropenem (MERREM) IV 500 mg (10/29/18 0915)  . Vancomycin    . vancomycin     acetaminophen **OR** acetaminophen, camphor-menthol **AND** hydrOXYzine, docusate sodium, feeding supplement (NEPRO CARB STEADY), fluticasone, loperamide, ondansetron **OR** ondansetron (ZOFRAN) IV, oxyCODONE, promethazine, sorbitol, zolpidem    Exam:  awakea nd alert on HD  no jvd  Chest cta bilat  Cor reg no RG  Abd obese RLQ wound bandaged w/ drain inplace  Ext no edema  R IJ TDC/ LUE 1st stg BVT   NF Ox3    [facility]  4h  350 /800  76.5kg  2K/3.5Ca P4  Hep 4K+  2K midrun TDC/ maturing AVF  Mircera 225 IV q 2 weeks (dosed 8/10)   Assessment/ Plan: 1. Abd pain/fevers s/p recent transplant nephrectomy: Abd CT + for air-fluid collection in RLQ site of previous transplant  concerning for intra-abdominal abscess. S/p IR drain of abscess  -cultures pending.  On Vancomycin +meropenem  per pharmacy.  2. ESRD -MWF HD. HD today.  3. Hypertension/volume - BP controlled. No volume excess on exam.+3-4kg by wts 4. Anemia - Hgb 7.4 s/p 1 unit prbc on 8/14.  On max ESA as outpatient. Follow trends. 5. Metabolic bone disease -Using Added Ca++ bath as outpatient, follow trends. Renvela binders + Tums as outpatient. 6. Nutrition -Renal diet, supplements when eating  7. DMType 1- Insulin per primary     [facility] 10/30/2018, 10:44 AM  Iron/TIBC/Ferritin/ %Sat    Component Value Date/Time   IRON 82 03/20/2018 0459   TIBC 132 (L) 03/20/2018 0459   FERRITIN 855 (H) 03/20/2018 0459   IRONPCTSAT 62 (H) 03/20/2018 0459   Recent Labs  Lab 10/28/18 0919  10/30/18 0617  NA  --    < > 131*  K  --    < > 3.4*  CL  --    < > 96*  CO2  --    < > 22  GLUCOSE  --    < >  365*  BUN  --    < > 43*  CREATININE  --    < > 9.28*  CALCIUM  --    < > 7.1*  PHOS  --   --  2.5  ALBUMIN  --    < > 2.2*  INR 1.3*  --   --    < > = values in this interval not displayed.   Recent Labs  Lab 10/29/18 0706  AST 82*  ALT 25  ALKPHOS 86  BILITOT 0.5  PROT 6.4*   Recent Labs  Lab 10/30/18 0617  WBC 7.4  HGB 7.5*  HCT 25.0*  PLT 272

## 2018-10-30 NOTE — Progress Notes (Addendum)
[facility] Progress Note      Assessment/Plan ESRD - HD MWF HTN DM Chronic pain Anemia of chronic disease   Abscess in right iliac fossa at the site of [facility] - S/p IR drain placement 8/15. Drain management per IR - drain with cloudy/red fluid. Culture pending. Antibiotics per medicine - Patient should follow up with her original surgeon after discharge.  ID -merrem 8/15>>, vancomycin 8/15>> FEN -IVF, renal/CM diet VTE -SCDs, sq heparin Foley -none Follow up -TBD   Plan: okay for discharge from our standpoint. Cultures pending.     LOS: 2 days    Subjective: CC: mild abdominal pain  No issues overnight. Pt states continued mild abdominal pain in RUQ. No nausea or vomiting. She wants to go home. Pt states she has had a drain before and can manage it at home. She is asking for a elastic band to hold the bulb in place around her waist or leg.  Objective: Vital signs in last 24 hours: Temp:  [98 F (36.7 C)-98.4 F (36.9 C)] (P) 98.4 F (36.9 C) (08/17 0720) Pulse Rate:  [80-94] (P) 85 (08/17 0744) Resp:  [13-18] (P) 13 (08/17 0744) BP: (82-97)/(55-73) (P) 117/85 (08/17 0744) SpO2:  [90 %-95 %] (P) 95 % (08/17 0720) Weight:  [80.4 kg] (P) 80.4 kg (08/17 0720) Last BM Date: 10/29/18  Intake/Output from previous day: 08/16 0701 - 08/17 0700 In: 905 [P.O.:900] Out: 70 [Drains:47] Intake/Output this shift: No intake/output data recorded.  PE: Gen: Alert, NAD, undergoing hemodialysis  HEENT: pupils equal and round Pulm:Rate andeffort normal Abd: Soft,nondistended, no HSM. Well healed midline and RLQ incisions. mild TTP in RUQ without guarding. No peritonitis. Drain with serous output Psych: A&Ox3  Skin: no rashes noted, warm and dry  Anti-infectives: Anti-infectives (From admission, onward)   Start     Dose/Rate Route Frequency Ordered Stop   10/30/18 1200  vancomycin (VANCOCIN)  IVPB 750 mg/150 ml premix     750 mg 150 mL/hr over 60 Minutes Intravenous Every M-W-F (Hemodialysis) 10/28/18 0854     10/28/18 1000  vancomycin (VANCOCIN) 1,750 mg in sodium chloride 0.9 % 500 mL IVPB     1,750 mg 250 mL/hr over 120 Minutes Intravenous  Once 10/28/18 0854 10/28/18 1335   10/28/18 0600  meropenem (MERREM) 500 mg in sodium chloride 0.9 % 100 mL IVPB     500 mg 200 mL/hr over 30 Minutes Intravenous Daily 10/28/18 0017     10/27/18 2330  piperacillin-tazobactam (ZOSYN) IVPB 3.375 g     3.375 g 100 mL/hr over 30 Minutes Intravenous  Once 10/27/18 2316 10/28/18 0038      Lab Results:  Recent Labs    10/29/18 0706 10/30/18 0617  WBC 5.5 7.4  HGB 7.4* 7.5*  HCT 24.3* 25.0*  PLT 259 272   BMET Recent Labs    10/29/18 0706 10/30/18 0617  NA 129* 131*  K 4.0 3.4*  CL 93* 96*  CO2 22 22  GLUCOSE 456* 365*  BUN 30* 43*  CREATININE 7.41* 9.28*  CALCIUM 7.8* 7.1*   PT/INR Recent Labs    10/28/18 0919  LABPROT 16.2*  INR 1.3*   CMP     Component Value Date/Time   NA 131 (L) 10/30/2018 0617   K 3.4 (L) 10/30/2018 0617   CL 96 (L) 10/30/2018 0617   CO2 22 10/30/2018 0617   GLUCOSE 365 (H) 10/30/2018 0617   BUN 43 (H) 10/30/2018 0617  CREATININE 9.28 (H) 10/30/2018 0617   CALCIUM 7.1 (L) 10/30/2018 0617   CALCIUM 5.7 (LL) 03/20/2018 1915   PROT 6.4 (L) 10/29/2018 0706   ALBUMIN 2.2 (L) 10/30/2018 0617   AST 82 (H) 10/29/2018 0706   ALT 25 10/29/2018 0706   ALKPHOS 86 10/29/2018 0706   BILITOT 0.5 10/29/2018 0706   GFRNONAA 5 (L) 10/30/2018 0617   GFRAA 6 (L) 10/30/2018 0617   Lipase     Component Value Date/Time   LIPASE 43 09/17/2018 0417    Studies/Results: Ir Lenise Arena W Catheter Placement  Result Date: 10/28/2018 INDICATION: 33 year old with end-stage renal disease. Patient had a transplant kidney removed from the right lower quadrant. Patient presents with abdominal pain and found to have a complex fluid collection at the old  transplant site. EXAM: ULTRASOUND-GUIDED PLACEMENT OF A DRAINAGE CATHETER WITHIN THE RIGHT LOWER QUADRANT ABDOMINAL FLUID COLLECTION MEDICATIONS: Moderate sedation ANESTHESIA/SEDATION: Fentanyl 75 mcg IV; Versed 1.0 mg IV Moderate Sedation Time:  10 minutes The patient was continuously monitored during the procedure by the interventional radiology nurse under my direct supervision. COMPLICATIONS: None immediate. PROCEDURE: Informed written consent was obtained from the patient after a thorough discussion of the procedural risks, benefits and alternatives. All questions were addressed. Maximal Sterile Barrier Technique was utilized including caps, mask, sterile gowns, sterile gloves, sterile drape, hand hygiene and skin antiseptic. A timeout was performed prior to the initiation of the procedure. Ultrasound was used to identify the complex fluid collection in the right lower quadrant of the abdomen. The overlying skin was prepped with chlorhexidine and sterile field was created. Skin and soft tissues were anesthetized with 1% lidocaine. Using ultrasound guidance, 18 gauge trocar needle was directed into the complex fluid collection and cloudy red fluid was aspirated. A stiff Amplatz wire was advanced into the collection. The tract was dilated to accommodate a 12 [place] multipurpose drain. Greater than 60 mL of cloudy red fluid was removed. The tube was slightly manipulated within the collection using ultrasound guidance in order to obtain more fluid. Catheter was sutured to skin and attached to a suction bulb. FINDINGS: Complex fluid collection in the right lower quadrant the abdomen with echogenic foci. Findings are compatible with air-fluid collection seen on the recent CT. Cloudy red fluid was removed from the collection, greater than 60 mL. Appearance of the fluid raises concern for an infected hematoma. Fluid was sent for culture. IMPRESSION: Ultrasound-guided placement of a drainage catheter within the right  lower quadrant fluid collection. Cloudy red fluid was removed and sent for culture. Electronically Signed   By: Markus Daft M.D.   On: 10/28/2018 13:48      Kalman Drape , [facility] 10/30/2018, 8:56 AM  Pager: [phone] Mon-Wed, Friday 7:00am-4:30pm Thurs 7am-11:30am  Consults: [phone]

## 2018-10-30 NOTE — Progress Notes (Signed)
Inpatient Diabetes Program Recommendations  AACE/ADA: New Consensus Statement on Inpatient Glycemic Control (2015)  Target Ranges:  Prepandial:   less than 140 mg/dL      Peak postprandial:   less than 180 mg/dL (1-2 hours)      Critically ill patients:  140 - 180 mg/dL   Results for LILIANAH, STOPHEL (MRN [id number]) as of 10/30/2018 12:58  Ref. Range 10/29/2018 07:11 10/29/2018 08:38 10/29/2018 11:38 10/29/2018 16:17 10/29/2018 20:48  Glucose-Capillary Latest Ref Range: 70 - 99 mg/dL 428 (H)  12 units NOVOLOG  452 (H)    20 units LANTUS 310 (H)  17 units NOVOLOG  82  10 units NOVOLOG  124 (H)   Results for DENIJAH, WELLS (MRN [id number]) as of 10/30/2018 12:58  Ref. Range 10/30/2018 06:53 10/30/2018 12:12  Glucose-Capillary Latest Ref Range: 70 - 99 mg/dL 377 (H)    NOVOLOG HELD while patient in Dialysis 272 (H)    25 units LANTUS   Results for JAMILLA, SHINGLEDECKER (MRN [id number]) as of 10/30/2018 12:58  Ref. Range 02/03/2018 04:47 05/02/2018 08:15 09/01/2018 13:24  Hemoglobin A1C Latest Ref Range: 4.8 - 5.6 % 10.9 (H) 10.8 (H) 10.1 (H)     Admit with: Intra-abdominal abscess where transplanted kidney removed  History: DM, ERSD, Failed Kidney Transplant  Home DM Meds: Tresiba 20 units Daily       Novolog 12-15 units 5-6 times per day  Current Orders: Lantus 25 units Daily      Novolog Sensitive Correction Scale/ SSI (0-9 units) TID AC + HS      Novolog 10 units TID with meals     MD- Note Lantus increased to 25 units daily today.  Did not get any Novolog this AM.  May consider increasing Novolog Meal Coverage to: Novolog 12 units TID with meals  (Please add the following Hold Parameters: Hold if pt eats <50% of meal, Hold if pt NPO)      --Will follow patient during hospitalization--  [name] RN, MSN, CDE Diabetes Coordinator Inpatient Glycemic Control Team Team Pager: [phone] (8a-5p)
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
## 2018-10-30 NOTE — Progress Notes (Signed)
Patient ID: [name], female   DOB: 10-03-1985, 33 y.o.   MRN: [id number]  PROGRESS NOTE    [name]  [id number] DOB: 1986-01-01 DOA: 10/27/2018 PCP: [name], MD   Brief Narrative:  33 year old female with history of diabetes mellitus type 2, end-stage renal disease on hemodialysis who failed kidney transplant recently with removal of transplant about 2 months ago presented with abdominal pain.  She was found to have intra-abdominal abscess at the area where she had her transplanted kidney removed.  General surgery was consulted who recommended IR guided aspiration.  She was started on intravenous antibiotics.  Nephrology was consulted.  She underwent IR guided drain placement on 10/28/2018.  Assessment & Plan:   Intra-abdominal abscess -CT of the abdomen and pelvis showed intra-abdominal abscess at the area where she had her transplanted kidney removed -Currently on vancomycin and meropenem. -Status post IR guided aspiration and drain placement on 10/28/2018.  Gram stain showing few gram-positive cocci.  Follow cultures. -General surgery following as well. -Currently afebrile. -I would wait for finalization of culture results before I feel comfortable discharging the patient home.  Patient adamant about going home today.  If that is the case, she will have to sign out [place].  End-stage renal disease on hemodialysis with history of failed previous renal transplant -Nephrology following.  Dialysis as per nephrology schedule.  Diabetes mellitus type 2 uncontrolled with hyperglycemia -Blood sugars fluctuating.  Continue Lantus with NovoLog.  Continue CBGs with SSI.  Anemia of chronic disease -From renal failure.  Hemoglobin pending today.  Status post 1 unit packed red cells transfusion since admission.  Monitor  Hypertension -Blood pressure stable.  Continue amlodipine.  Chronic pain syndrome -Continue home regimen.   DVT prophylaxis: Heparin Code  Status: Full Family Communication: None at bedside Disposition Plan: Home in 1 to 3 days once cleared by general surgery/IR and if clinically improves  Consultants: General surgery/IR/nephrology  Procedures: IR guided drain placement on 10/28/2018  Antimicrobials:  Anti-infectives (From admission, onward)   Start     Dose/Rate Route Frequency Ordered Stop   10/30/18 1200  vancomycin (VANCOCIN) IVPB 750 mg/150 ml premix     750 mg 150 mL/hr over 60 Minutes Intravenous Every M-W-F (Hemodialysis) 10/28/18 0854     10/28/18 1000  vancomycin (VANCOCIN) 1,750 mg in sodium chloride 0.9 % 500 mL IVPB     1,750 mg 250 mL/hr over 120 Minutes Intravenous  Once 10/28/18 0854 10/28/18 1335   10/28/18 0600  meropenem (MERREM) 500 mg in sodium chloride 0.9 % 100 mL IVPB     500 mg 200 mL/hr over 30 Minutes Intravenous Daily 10/28/18 0017     10/27/18 2330  piperacillin-tazobactam (ZOSYN) IVPB 3.375 g     3.375 g 100 mL/hr over 30 Minutes Intravenous  Once 10/27/18 2316 10/28/18 0038      Subjective: Patient seen and examined at bedside.  Denies any overnight fever, nausea, vomiting or worsening abdominal pain.  Asking if she could go home today. Objective: Vitals:   10/29/18 2048 10/29/18 2357 10/30/18 0308 10/30/18 0605  BP: (!) 88/64 92/73 97/73  95/71  Pulse: 94 90 92 93  Resp: 18   16  Temp: 98 F (36.7 C)     TempSrc: Oral   Oral  SpO2: 90%  93% 92%    Intake/Output Summary (Last 24 hours) at 10/30/2018 0755 Last data filed at 10/30/2018 0600 Gross per 24 hour  Intake 905 ml  Output 47 ml  Net 858 ml   There were no vitals filed for this visit.  Examination:  General exam: No acute distress.   Respiratory system: Bilateral decreased breath sounds at bases with some scattered crackles Cardiovascular system: S1 & S2 heard, Rate controlled Gastrointestinal system: Abdomen is nondistended, soft and mildly tender in the right lower quadrant with drain present. Normal bowel sounds  heard. Extremities: No cyanosis.  Mild edema in lower extremities.   Data Reviewed: I have personally reviewed following labs and imaging studies  CBC: Recent Labs  Lab 10/26/18 1036 10/27/18 1840 10/28/18 0635 10/29/18 0706  WBC  --  5.9 5.7 5.5  NEUTROABS  --  3.4  --  3.2  HGB 7.5* 6.9* 7.6* 7.4*  HCT 22.0* 23.7* 25.2* 24.3*  MCV  --  90.1 90.0 89.7  PLT  --  304 290 [id number]   Basic Metabolic Panel: Recent Labs  Lab 10/26/18 1036 10/27/18 1840 10/28/18 0635 10/29/18 0706  NA 129* 136 135 129*  K 4.7 3.6 3.7 4.0  CL 94* 96* 96* 93*  CO2  --  25 24 22   GLUCOSE 365* 190* 230* 456*  BUN 36* 9 13 30*  CREATININE 6.70* 3.69* 5.29* 7.41*  CALCIUM  --  9.9 9.1 7.8*  MG  --   --   --  1.9   GFR: Estimated Creatinine Clearance: 11.2 mL/min (A) (by C-G formula based on SCr of 7.41 mg/dL (H)). Liver Function Tests: Recent Labs  Lab 10/27/18 1840 10/28/18 0635 10/29/18 0706  AST 125* 178* 82*  ALT 30 37 25  ALKPHOS 108 92 86  BILITOT 1.0 1.2 0.5  PROT 7.2 6.8 6.4*  ALBUMIN 2.7* 2.5* 2.3*   No results for input(s): LIPASE, AMYLASE in the last 168 hours. No results for input(s): AMMONIA in the last 168 hours. Coagulation Profile: Recent Labs  Lab 10/28/18 0919  INR 1.3*   Cardiac Enzymes: No results for input(s): CKTOTAL, CKMB, CKMBINDEX, TROPONINI in the last 168 hours. BNP (last 3 results) No results for input(s): PROBNP in the last 8760 hours. HbA1C: No results for input(s): HGBA1C in the last 72 hours. CBG: Recent Labs  Lab 10/29/18 0838 10/29/18 1138 10/29/18 1617 10/29/18 2048 10/30/18 0653  GLUCAP 452* 310* 82 124* 377*   Lipid Profile: No results for input(s): CHOL, HDL, LDLCALC, TRIG, CHOLHDL, LDLDIRECT in the last 72 hours. Thyroid Function Tests: No results for input(s): TSH, T4TOTAL, FREET4, T3FREE, THYROIDAB in the last 72 hours. Anemia Panel: No results for input(s): VITAMINB12, FOLATE, FERRITIN, TIBC, IRON, RETICCTPCT in the last 72  hours. Sepsis Labs: Recent Labs  Lab 10/27/18 1840  LATICACIDVEN 1.2    Recent Results (from the past 240 hour(s))  SARS CORONAVIRUS 2 Nasal Swab Aptima Multi Swab     Status: None   Collection Time: 10/24/18 12:10 PM   Specimen: Aptima Multi Swab; Nasal Swab  Result Value Ref Range Status   SARS Coronavirus 2 NEGATIVE NEGATIVE Final    Comment: (NOTE) SARS-CoV-2 target nucleic acids are NOT DETECTED. The SARS-CoV-2 RNA is generally detectable in upper and lower respiratory specimens during the acute phase of infection. Negative results do not preclude SARS-CoV-2 infection, do not rule out co-infections with other pathogens, and should not be used as the sole basis for treatment or other patient management decisions. Negative results must be combined with clinical observations, patient history, and epidemiological information. The expected result is Negative. Fact Sheet for Patients: SugarRoll.be Fact Sheet for Healthcare Providers: https://www.woods-mathews.com/ This test is  not yet approved or cleared by the [place] and  has been authorized for detection and/or diagnosis of SARS-CoV-2 by FDA under an Emergency Use Authorization (EUA). This EUA will remain  in effect (meaning this test can be used) for the duration of the COVID-19 declaration under Section 56 4(b)(1) of the Act, 21 U.S.C. section 360bbb-3(b)(1), unless the authorization is terminated or revoked sooner. Performed at [facility], [place] [address] [place], [place] [address]   SARS Coronavirus 2 [facility] order, Performed in [facility] hospital lab) Nasopharyngeal Nasopharyngeal Swab     Status: None   Collection Time: 10/28/18  1:55 AM   Specimen: Nasopharyngeal Swab  Result Value Ref Range Status   SARS Coronavirus 2 NEGATIVE NEGATIVE Final    Comment: (NOTE) If result is NEGATIVE SARS-CoV-2 target nucleic acids are NOT DETECTED. The SARS-CoV-2 RNA  is generally detectable in upper and lower  respiratory specimens during the acute phase of infection. The lowest  concentration of SARS-CoV-2 viral copies this assay can detect is 250  copies / mL. A negative result does not preclude SARS-CoV-2 infection  and should not be used as the sole basis for treatment or other  patient management decisions.  A negative result may occur with  improper specimen collection / handling, submission of specimen other  than nasopharyngeal swab, presence of viral mutation(s) within the  areas targeted by this assay, and inadequate number of viral copies  (<250 copies / mL). A negative result must be combined with clinical  observations, patient history, and epidemiological information. If result is POSITIVE SARS-CoV-2 target nucleic acids are DETECTED. The SARS-CoV-2 RNA is generally detectable in upper and lower  respiratory specimens dur ing the acute phase of infection.  Positive  results are indicative of active infection with SARS-CoV-2.  Clinical  correlation with patient history and other diagnostic information is  necessary to determine patient infection status.  Positive results do  not rule out bacterial infection or co-infection with other viruses. If result is PRESUMPTIVE POSTIVE SARS-CoV-2 nucleic acids MAY BE PRESENT.   A presumptive positive result was obtained on the submitted specimen  and confirmed on repeat testing.  While 2019 novel coronavirus  (SARS-CoV-2) nucleic acids may be present in the submitted sample  additional confirmatory testing may be necessary for epidemiological  and / or clinical management purposes  to differentiate between  SARS-CoV-2 and other Sarbecovirus currently known to infect humans.  If clinically indicated additional testing with an alternate test  methodology [phone]) is advised. The SARS-CoV-2 RNA is generally  detectable in upper and lower respiratory sp ecimens during the acute  phase of  infection. The expected result is Negative. Fact Sheet for Patients:  StrictlyIdeas.no Fact Sheet for Healthcare Providers: BankingDealers.co.za This test is not yet approved or cleared by the [place] FDA and has been authorized for detection and/or diagnosis of SARS-CoV-2 by FDA under an Emergency Use Authorization (EUA).  This EUA will remain in effect (meaning this test can be used) for the duration of the COVID-19 declaration under Section 564(b)(1) of the Act, 21 U.S.C. section 360bbb-3(b)(1), unless the authorization is terminated or revoked sooner. Performed at [facility], [place] [address], [place], [place] [address]   MRSA PCR Screening     Status: None   Collection Time: 10/28/18  6:51 AM   Specimen: Nasopharyngeal  Result Value Ref Range Status   MRSA by PCR NEGATIVE NEGATIVE Final    Comment:  The GeneXpert MRSA Assay (FDA approved for NASAL specimens only), is one component of a comprehensive MRSA colonization surveillance program. It is not intended to diagnose MRSA infection nor to guide or monitor treatment for MRSA infections. Performed at [facility], [place] [address], [place], [place] [address]   Aerobic/Anaerobic Culture (surgical/deep wound)     Status: None (Preliminary result)   Collection Time: 10/28/18  1:35 PM   Specimen: Abscess  Result Value Ref Range Status   Specimen Description ABSCESS  Final   Special Requests NONE  Final   Gram Stain   Final    ABUNDANT WBC PRESENT, PREDOMINANTLY PMN FEW GRAM POSITIVE COCCI    Culture   Final    CULTURE REINCUBATED FOR BETTER GROWTH Performed at [facility], [place] [address], [place], [place] [address]    Report Status PENDING  Incomplete         Radiology Studies: Ir Lenise Arena W Catheter Placement  Result Date: 10/28/2018 INDICATION: 33 year old with end-stage renal disease. Patient had a transplant kidney removed from  the right lower quadrant. Patient presents with abdominal pain and found to have a complex fluid collection at the old transplant site. EXAM: ULTRASOUND-GUIDED PLACEMENT OF A DRAINAGE CATHETER WITHIN THE RIGHT LOWER QUADRANT ABDOMINAL FLUID COLLECTION MEDICATIONS: Moderate sedation ANESTHESIA/SEDATION: Fentanyl 75 mcg IV; Versed 1.0 mg IV Moderate Sedation Time:  10 minutes The patient was continuously monitored during the procedure by the interventional radiology nurse under my direct supervision. COMPLICATIONS: None immediate. PROCEDURE: Informed written consent was obtained from the patient after a thorough discussion of the procedural risks, benefits and alternatives. All questions were addressed. Maximal Sterile Barrier Technique was utilized including caps, mask, sterile gowns, sterile gloves, sterile drape, hand hygiene and skin antiseptic. A timeout was performed prior to the initiation of the procedure. Ultrasound was used to identify the complex fluid collection in the right lower quadrant of the abdomen. The overlying skin was prepped with chlorhexidine and sterile field was created. Skin and soft tissues were anesthetized with 1% lidocaine. Using ultrasound guidance, 18 gauge trocar needle was directed into the complex fluid collection and cloudy red fluid was aspirated. A stiff Amplatz wire was advanced into the collection. The tract was dilated to accommodate a 12 [place] multipurpose drain. Greater than 60 mL of cloudy red fluid was removed. The tube was slightly manipulated within the collection using ultrasound guidance in order to obtain more fluid. Catheter was sutured to skin and attached to a suction bulb. FINDINGS: Complex fluid collection in the right lower quadrant the abdomen with echogenic foci. Findings are compatible with air-fluid collection seen on the recent CT. Cloudy red fluid was removed from the collection, greater than 60 mL. Appearance of the fluid raises concern for an infected  hematoma. Fluid was sent for culture. IMPRESSION: Ultrasound-guided placement of a drainage catheter within the right lower quadrant fluid collection. Cloudy red fluid was removed and sent for culture. Electronically Signed   By: Markus Daft M.D.   On: 10/28/2018 13:48        Scheduled Meds:  sodium chloride   Intravenous Once   amLODipine  10 mg Oral Daily   calcium carbonate (dosed in mg elemental calcium)  500 mg of elemental calcium Oral BID   dicyclomine  20 mg Oral BID   gabapentin  300 mg Oral BID   heparin  5,000 Units Subcutaneous Q8H   insulin aspart  0-5 Units Subcutaneous QHS   insulin aspart  0-9 Units Subcutaneous TID WC   insulin aspart  10 Units Subcutaneous TID WC   insulin glargine  25 Units Subcutaneous Daily   loratadine  10 mg Oral Daily   metoCLOPramide  10 mg Oral TID AC   multivitamin  1 tablet Oral QHS   pantoprazole  40 mg Oral BID   predniSONE  10 mg Oral Q breakfast   sevelamer carbonate  1.6 g Oral TID WC   simvastatin  20 mg Oral QHS   sodium chloride flush  5 mL Intracatheter Q8H   Continuous Infusions:  meropenem (MERREM) IV 500 mg (10/29/18 0915)   vancomycin       LOS: 2 days        [name], MD [facility] 10/30/2018, 7:55 AM

## 2018-10-31 LAB — GLUCOSE, CAPILLARY
Glucose-Capillary: 199 mg/dL — ABNORMAL HIGH (ref 70–99)
Glucose-Capillary: 166 mg/dL — ABNORMAL HIGH (ref 70–99)

## 2018-10-31 LAB — CBC WITH DIFFERENTIAL/PLATELET
Abs Immature Granulocytes: 0.41 10*3/uL — ABNORMAL HIGH (ref 0.00–0.07)
Basophils Absolute: 0.1 10*3/uL (ref 0.0–0.1)
Basophils Relative: 1 %
Eosinophils Absolute: 0.2 10*3/uL (ref 0.0–0.5)
Eosinophils Relative: 3 %
HCT: 29.9 % — ABNORMAL LOW (ref 36.0–46.0)
Hemoglobin: 8.7 g/dL — ABNORMAL LOW (ref 12.0–15.0)
Immature Granulocytes: 5 %
Lymphocytes Relative: 32 %
Lymphs Abs: 2.6 10*3/uL (ref 0.7–4.0)
MCH: 26.6 pg (ref 26.0–34.0)
MCHC: 29.1 g/dL — ABNORMAL LOW (ref 30.0–36.0)
MCV: 91.4 fL (ref 80.0–100.0)
Monocytes Absolute: 0.7 10*3/uL (ref 0.1–1.0)
Monocytes Relative: 9 %
Neutro Abs: 4.2 10*3/uL (ref 1.7–7.7)
Neutrophils Relative %: 50 %
Platelets: 265 10*3/uL (ref 150–400)
RBC: 3.27 MIL/uL — ABNORMAL LOW (ref 3.87–5.11)
RDW: 16.3 % — ABNORMAL HIGH (ref 11.5–15.5)
WBC: 8.1 10*3/uL (ref 4.0–10.5)
nRBC: 1.6 % — ABNORMAL HIGH (ref 0.0–0.2)

## 2018-10-31 LAB — BASIC METABOLIC PANEL
Anion gap: 13 (ref 5–15)
BUN: 21 mg/dL — ABNORMAL HIGH (ref 6–20)
CO2: 22 mmol/L (ref 22–32)
Calcium: 7.1 mg/dL — ABNORMAL LOW (ref 8.9–10.3)
Chloride: 100 mmol/L (ref 98–111)
Creatinine, Ser: 6.31 mg/dL — ABNORMAL HIGH (ref 0.44–1.00)
GFR calc Af Amer: 9 mL/min — ABNORMAL LOW (ref >60)
GFR calc non Af Amer: 8 mL/min — ABNORMAL LOW (ref >60)
Glucose, Bld: 216 mg/dL — ABNORMAL HIGH (ref 70–99)
Potassium: 3.6 mmol/L (ref 3.5–5.1)
Sodium: 135 mmol/L (ref 135–145)

## 2018-10-31 MED ORDER — CEFTAZIDIME IV (FOR PTA / DISCHARGE USE ONLY): 2.0000 g | INTRAVENOUS | 0 refills | Status: AC | PRN

## 2018-10-31 MED ORDER — OXYCODONE HCL 5 MG PO TABS: 5.0000 mg | ORAL_TABLET | Freq: Four times a day (QID) | ORAL | 0 refills | Status: DC | PRN

## 2018-10-31 MED ORDER — VANCOMYCIN HCL 1000 MG IV SOLR: 750.0000 mg | INTRAVENOUS | 0 refills | Status: AC

## 2018-10-31 NOTE — Progress Notes (Signed)
Referring Physician(s): Donnie Mesa  Supervising Physician: Corrie Mckusick  Patient Status:  [facility] - In-pt  Chief Complaint: None  Subjective:  Patient with history of right transplant kidney removal with subsequent development of a RLQ fluid collection s/p RLQ drain placement 10/28/2018 by Dr. Anselm Pancoast. Patient awake and alert sitting in chair watching TV with no complaints. States she is excited to go home today. RLQ drain c/d/i.   Allergies: Benadryl [diphenhydramine-zinc acetate], Diphenhydramine hcl, Doxycycline, Motrin [ibuprofen], Banana, Iron dextran, and Shellfish allergy  Medications: Prior to Admission medications   Medication Sig Start Date End Date Taking? Authorizing Provider  acetaminophen (TYLENOL) 500 MG tablet Take 1,000 mg by mouth daily as needed for moderate pain.    Yes [provider]  amLODipine (NORVASC) 10 MG tablet Take 1 tablet (10 mg total) by mouth daily. 03/25/18  Yes Thurnell Lose, MD  b complex-vitamin c-folic acid (NEPHRO-VITE) 0.8 MG TABS tablet Take 1 tablet by mouth every Monday, Wednesday, and Friday. After dialysis on dialysis days 07/19/18  Yes [provider]  cetirizine (ZYRTEC) 10 MG tablet Take 10 mg by mouth daily as needed for allergies.   Yes [provider]  dicyclomine (BENTYL) 20 MG tablet Take 1 tablet (20 mg total) by mouth 2 (two) times daily. 02/06/18  Yes [name], Ripudeep K, MD  famotidine (PEPCID) 40 MG tablet Take 40 mg by mouth daily.    Yes [provider]  fluticasone (FLONASE) 50 MCG/ACT nasal spray Place 2 sprays into both nostrils daily as needed for allergies. 09/03/18  Yes [name], MD  gabapentin (NEURONTIN) 300 MG capsule Take 300 mg by mouth 2 (two) times daily.    Yes [provider]  hydrOXYzine (ATARAX/VISTARIL) 50 MG tablet Take 1 tablet (50 mg total) by mouth 2 (two) times daily as needed for itching. 02/06/18  Yes [name], Ripudeep K, MD  insulin aspart (NOVOLOG  FLEXPEN) 100 UNIT/ML FlexPen Inject 12-15 Units into the skin See admin instructions. 5-6 times per day as needed for blood sugar management   Yes [provider]  insulin degludec (TRESIBA) 100 UNIT/ML SOPN FlexTouch Pen Inject 0.6 mLs (60 Units total) into the skin 2 (two) times daily. Reports taking 24 units QAM Patient taking differently: Inject 20 Units into the skin daily.  03/06/18  Yes Charlynne Cousins, MD  loperamide (IMODIUM) 2 MG capsule Take 2 mg by mouth as needed for diarrhea or loose stools.    Yes [provider]  metoCLOPramide (REGLAN) 10 MG tablet Take 1 tablet (10 mg total) by mouth 3 (three) times daily before meals. 02/06/18  Yes [name], Ripudeep K, MD  pantoprazole (PROTONIX) 40 MG tablet Take 40 mg by mouth 2 (two) times daily.   Yes [provider]  predniSONE (DELTASONE) 10 MG tablet Take 1 tablet (10 mg total) by mouth every morning. 03/06/18  Yes Charlynne Cousins, MD  promethazine (PHENERGAN) 25 MG tablet Take 1 tablet (25 mg total) by mouth every 6 (six) hours as needed for nausea or vomiting. 02/06/18  Yes [name], Ripudeep K, MD  sevelamer carbonate (RENVELA) 0.8 g PACK packet Take 2 packets by mouth 3 (three) times daily with meals. 10/09/18  Yes [provider]  simvastatin (ZOCOR) 20 MG tablet Take 20 mg by mouth at bedtime.    Yes [provider]  ACCU-CHEK SOFTCLIX LANCETS lancets Use to check blood sugar 4 times per day. 12/29/15   Renato Shin, MD  cefTAZidime Tressie Ellis) IVPB Inject  2 g into the vein every dialysis for up to 9 days. 11/10/2018 11/01/18 11/10/18  [name], MD  oxyCODONE (ROXICODONE) 5 MG immediate release tablet Take 1 tablet (5 mg total) by mouth every 6 (six) hours as needed for severe pain. 10/31/18   [name], MD  vancomycin 750 mg in sodium chloride 0.9 % 150 mL Inject 750 mg into the vein every Monday, Wednesday, and Friday with hemodialysis for 9 days. Till 11/10/18 11/01/18 11/10/18  [name], MD     Vital Signs: BP (!) 85/62 (BP Location: Right Arm)    Pulse 98    Temp 98.6 F (37 C) (Oral)    Resp 18    Wt 169 lb 5 oz (76.8 kg)    LMP  (LMP Unknown) Comment: pt shielded   SpO2 96%    BMI 27.33 kg/m   Physical Exam Vitals signs and nursing note reviewed.  Constitutional:      General: She is not in acute distress.    Appearance: Normal appearance.  Pulmonary:     Effort: Pulmonary effort is normal. No respiratory distress.  Abdominal:     Palpations: Abdomen is soft.     Comments: RLQ drain site without tenderness, erythema, drainage, or active bleeding; approximately 10 cc dark red fluid in suction bulb; drain flushes/aspirates without resistance.  Skin:    General: Skin is warm and dry.  Neurological:     Mental Status: She is alert and oriented to person, place, and time.  Psychiatric:        Mood and Affect: Mood normal.        Behavior: Behavior normal.        Thought Content: Thought content normal.        Judgment: Judgment normal.     Imaging: Dg Chest 2 View  Result Date: 10/27/2018 CLINICAL DATA:  Patient felt a seizure developing during dialysis today. EXAM: CHEST - 2 VIEW COMPARISON:  Chest x-rays dated 09/01/2018 and 05/01/2018. FINDINGS: Stable cardiomegaly. Overall cardiomediastinal silhouette is stable. Dialysis catheter appears stable in position with tip overlying the RIGHT atrium. Stable streaky opacities at the RIGHT lung base, likely atelectasis. No new lung findings. No pleural effusion or pneumothorax seen. Osseous structures about the chest are unremarkable. IMPRESSION: No active cardiopulmonary disease. Stable cardiomegaly. Stable atelectasis at the RIGHT lung base. No evidence of pneumonia or pulmonary edema. Electronically Signed   By: Franki Cabot M.D.   On: 10/27/2018 19:21   Ct Abdomen Pelvis W Contrast  Result Date: 10/27/2018 CLINICAL DATA:  Acute generalized abdominal pain. 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 EXAM: CT ABDOMEN AND PELVIS WITH CONTRAST TECHNIQUE: Multidetector CT imaging of the abdomen and pelvis was performed using the standard protocol following bolus administration of intravenous contrast. CONTRAST:  151mL OMNIPAQUE IOHEXOL 300 MG/ML  SOLN COMPARISON:  09/17/2018, additional priors. FINDINGS: Lower chest: Central catheter tip at the atrial caval junction. Increased lung base atelectasis from prior exam. Hepatobiliary: No focal liver abnormality is seen. Status post cholecystectomy. No biliary dilatation. Pancreas: No ductal dilatation or inflammation. Spleen: Upper normal in size without focal abnormality. Adrenals/Urinary Tract: Normal adrenal glands. Chronically atrophic left native kidney. The right kidney not visualized. In the location of the previous right iliac fossa renal transplant is a heterogeneous air-fluid collection with peripheral enhancement measuring 11.3 x 5.9 x 6.4 cm. Urinary bladder is nondistended and not well evaluated. Stomach/Bowel: Bowel evaluation is limited in the absence of enteric contrast. Stomach  is decompressed. No evidence of bowel obstruction, wall thickening or inflammation. Appendix tentatively identified and normal. Small volume of stool in the colon. Vascular/Lymphatic: Normal caliber abdominal aorta. The portal vein is patent. There multiple prominent retroperitoneal lymph nodes that are similar to prior exam. Enlarged right external iliac node measuring 13 mm short axis is adjacent to prior renal transplant. Additional prominent lymph nodes in the right inguinal region. Reproductive: Uterus not visualized.  No adnexal masses. Other: Recent midline and right lower abdominal wound. Mild edema and minimal scattered fluid along the incision site, but no drainable subcutaneous fluid collection. Trace perihepatic free fluid. No free air. Musculoskeletal: No acute osseous abnormalities. Suggestion of mild renal osteodystrophy. IMPRESSION: 1.  Heterogeneous air-fluid collection in the location of the previous right iliac fossa renal transplant measuring 11.3 x 5.9 x 6.4 cm, suspicious for abscess. 2. Enlarged right external iliac and right inguinal lymph nodes are likely reactive. 3. Recent midline and right lower abdominal surgical wound. Mild edema and minimal scattered fluid along the incision site, but no drainable subcutaneous fluid collection. Electronically Signed   By: Keith Rake M.D.   On: 10/27/2018 23:06   Ir Guided Niel Hummer W Catheter Placement  Result Date: 10/28/2018 INDICATION: 33 year old with end-stage renal disease. Patient had a transplant kidney removed from the right lower quadrant. Patient presents with abdominal pain and found to have a complex fluid collection at the old transplant site. EXAM: ULTRASOUND-GUIDED PLACEMENT OF A DRAINAGE CATHETER WITHIN THE RIGHT LOWER QUADRANT ABDOMINAL FLUID COLLECTION MEDICATIONS: Moderate sedation ANESTHESIA/SEDATION: Fentanyl 75 mcg IV; Versed 1.0 mg IV Moderate Sedation Time:  10 minutes The patient was continuously monitored during the procedure by the interventional radiology nurse under my direct supervision. COMPLICATIONS: None immediate. PROCEDURE: Informed written consent was obtained from the patient after a thorough discussion of the procedural risks, benefits and alternatives. All questions were addressed. Maximal Sterile Barrier Technique was utilized including caps, mask, sterile gowns, sterile gloves, sterile drape, hand hygiene and skin antiseptic. A timeout was performed prior to the initiation of the procedure. Ultrasound was used to identify the complex fluid collection in the right lower quadrant of the abdomen. The overlying skin was prepped with chlorhexidine and sterile field was created. Skin and soft tissues were anesthetized with 1% lidocaine. Using ultrasound guidance, 18 gauge trocar needle was directed into the complex fluid collection and cloudy red fluid was  aspirated. A stiff Amplatz wire was advanced into the collection. The tract was dilated to accommodate a 12 [place] multipurpose drain. Greater than 60 mL of cloudy red fluid was removed. The tube was slightly manipulated within the collection using ultrasound guidance in order to obtain more fluid. Catheter was sutured to skin and attached to a suction bulb. FINDINGS: Complex fluid collection in the right lower quadrant the abdomen with echogenic foci. Findings are compatible with air-fluid collection seen on the recent CT. Cloudy red fluid was removed from the collection, greater than 60 mL. Appearance of the fluid raises concern for an infected hematoma. Fluid was sent for culture. IMPRESSION: Ultrasound-guided placement of a drainage catheter within the right lower quadrant fluid collection. Cloudy red fluid was removed and sent for culture. Electronically Signed   By: Markus Daft M.D.   On: 10/28/2018 13:48    Labs:  CBC: Recent Labs    10/28/18 0635 10/29/18 0706 10/30/18 0617 10/31/18 0619  WBC 5.7 5.5 7.4 8.1  HGB 7.6* 7.4* 7.5* 8.7*  HCT 25.2* 24.3* 25.0* 29.9*  PLT 290 259  272 265    COAGS: Recent Labs    10/28/18 0919  INR 1.3*    BMP: Recent Labs    10/28/18 0635 10/29/18 0706 10/30/18 0617 10/31/18 0619  NA 135 129* 131* 135  K 3.7 4.0 3.4* 3.6  CL 96* 93* 96* 100  CO2 24 22 22 22   GLUCOSE 230* 456* 365* 216*  BUN 13 30* 43* 21*  CALCIUM 9.1 7.8* 7.1* 7.1*  CREATININE 5.29* 7.41* 9.28* 6.31*  GFRNONAA 10* 6* 5* 8*  GFRAA 11* 8* 6* 9*    LIVER FUNCTION TESTS: Recent Labs    09/17/18 0410 10/27/18 1840 10/28/18 0635 10/29/18 0706 10/30/18 0617  BILITOT 0.6 1.0 1.2 0.5  --   AST 11* 125* 178* 82*  --   ALT 12 30 37 25  --   ALKPHOS 54 108 92 86  --   PROT 7.3 7.2 6.8 6.4*  --   ALBUMIN 3.1* 2.7* 2.5* 2.3* 2.2*    Assessment and Plan:  Patient with history of right transplant kidney removal with subsequent development of a RLQ fluid collection s/p  RLQ drain placement 10/28/2018 by Dr. Anselm Pancoast. RLQ drain stable with approximately 10 cc dark red fluid in suction bulb. Continue current drain management- continue with Qshift flushes/monitor of output. Further plans per [facility]- appreciate and agree with management.  Discharge instructions: - Flush drain daily with 5-10 cc NS flush. - Record output daily. - Follow-up in drain clinic 10-14 days after discharge for repeat CT/possible drain injection (assess for removal)- order placed to facilitate this.  Please call IR with questions/concerns.   Electronically Signed: Earley Abide, PA-C 10/31/2018, 11:01 AM   I spent a total of 25 Minutes at the the patient's bedside AND on the patient's hospital floor or unit, greater than 50% of which was counseling/coordinating care for RLQ fluid collection s/p RLQ drain placement.

## 2018-10-31 NOTE — Discharge Summary (Signed)
Physician Discharge Summary  [name] [id number] DOB: 09-03-1985 DOA: 10/27/2018  PCP: [name], MD  Admit date: 10/27/2018 Discharge date: 10/31/2018  Admitted From: Home Disposition: Home  Recommendations for Outpatient Follow-up:  1. Follow up with PCP in 1 week with repeat CBC/BMP 2. Outpatient follow-up with interventional radiology regarding drain management. 3. Outpatient follow-up with her own surgeon in [place] at earliest convenience. 4. Follow-up with outpatient dialysis unit as scheduled 5. Follow up in ED if symptoms worsen or new appear   Home Health: No Equipment/Devices: Intra-abdominal drain  Discharge Condition: Stable CODE STATUS: Full Diet recommendation: Heart healthy/carb modified/renal hemodialysis diet  Brief/Interim Summary: 33 year old female with history of diabetes mellitus type 2, end-stage renal disease on hemodialysis who failed kidney transplant recently with removal of transplant about 2 months ago presented with abdominal pain.  She was found to have intra-abdominal abscess at the area where she had her transplanted kidney removed.  General surgery was consulted who recommended IR guided aspiration.  She was started on intravenous antibiotics.  Nephrology was consulted.  She underwent IR guided drain placement on 10/28/2018. During the hospitalization, her condition has improved.  Drainage culture grew few normal skin flora, no anaerobes isolated, final culture report is still pending.  Patient adamantly wants to go home.  She will be discharged home on IV vancomycin and ceftazidime with dialysis till 11/10/2018.  She needs to follow-up with IR regarding management of drain and also follow-up with her own surgeon in [place] at earliest convenience.  Discharge Diagnoses:   Intra-abdominal abscess -CT of the abdomen and pelvis showed intra-abdominal abscess at the area where she had her transplanted kidney removed -Treated with vancomycin  and meropenem. -Status post IR guided aspiration and drain placement on 10/28/2018.  Gram stain showing few gram-positive cocci. Drainage culture grew few normal skin flora, no anaerobes isolated, final culture report is still pending.  Patient adamantly wants to go home.  Case discussed with Dr. Nicolette Bang on phone and he was agreeable for the patient to be discharged on intravenous ceftazidime and vancomycin with dialysis for total of 2 weeks.  She will need to follow-up with IR regarding further management of her drain and subsequent removal. -Patient is currently afebrile and general surgery recommends outpatient follow-up with her own surgeon in [place] at earliest convenience.  General surgery has cleared the patient for discharge. -I will discharge her on  IV vancomycin and ceftazidime with dialysis till 11/10/2018.  I have communicated with Dr. Schertz/nephrology who is agreeable with this plan.  End-stage renal disease on hemodialysis with history of failed previous renal transplant -Nephrology following.  Dialysis as per nephrology schedule.  Outpatient follow-up with dialysis unit as scheduled.  Diabetes mellitus type 2 uncontrolled with hyperglycemia -Continue home regimen.  Outpatient follow-up  Anemia of chronic disease -From renal failure.  Hemoglobin  stable, 8.7 today.  Status post 1 unit packed red cells transfusion since admission.    Outpatient follow-up  Hypertension -Blood pressure stable.  Continue amlodipine.  Chronic pain syndrome -Continue home regimen.  Discharge Instructions  Discharge Instructions    Call MD for:  persistant nausea and vomiting   Complete by: As directed    Call MD for:  redness, tenderness, or signs of infection (pain, swelling, redness, odor or green/yellow discharge around incision site)   Complete by: As directed    Call MD for:  severe uncontrolled pain   Complete by: As directed    Call MD for:  temperature >100.4  Complete by: As  directed    Diet - low sodium heart healthy   Complete by: As directed    Diet Carb Modified   Complete by: As directed    Discharge instructions   Complete by: As directed    Flush drain with 5-10 mL sterile saline daily.  Keep record of daily output and bring log to follow-up appointment.  Do not submerge drain.  Schedulers will contact you with IR follow-up appointment.   Increase activity slowly   Complete by: As directed      Allergies as of 10/31/2018      Reactions   Benadryl [diphenhydramine-zinc Acetate] Shortness Of Breath   Diphenhydramine Hcl Anaphylaxis   REACTION: Stopped breathing in ICU   Doxycycline Shortness Of Breath   Motrin [ibuprofen] Shortness Of Breath, Itching   Per pt   Banana Other (See Comments)   Sick on the stomach   Iron Dextran Other (See Comments)   REACTION: vein irritation   Shellfish Allergy Hives      Medication List    TAKE these medications   Accu-Chek Softclix Lancets lancets Use to check blood sugar 4 times per day.   acetaminophen 500 MG tablet Commonly known as: TYLENOL Take 1,000 mg by mouth daily as needed for moderate pain.   amLODipine 10 MG tablet Commonly known as: NORVASC Take 1 tablet (10 mg total) by mouth daily.   b complex-vitamin c-folic acid 0.8 MG Tabs tablet Take 1 tablet by mouth every Monday, Wednesday, and Friday. After dialysis on dialysis days   cefTAZidime  IVPB Commonly known as: FORTAZ Inject 2 g into the vein every dialysis for up to 9 days. 11/10/2018 Start taking on: November 01, 2018   cetirizine 10 MG tablet Commonly known as: ZYRTEC Take 10 mg by mouth daily as needed for allergies.   dicyclomine 20 MG tablet Commonly known as: BENTYL Take 1 tablet (20 mg total) by mouth 2 (two) times daily.   famotidine 40 MG tablet Commonly known as: PEPCID Take 40 mg by mouth daily.   fluticasone 50 MCG/ACT nasal spray Commonly known as: FLONASE Place 2 sprays into both nostrils daily as needed  for allergies.   gabapentin 300 MG capsule Commonly known as: NEURONTIN Take 300 mg by mouth 2 (two) times daily.   hydrOXYzine 50 MG tablet Commonly known as: ATARAX/VISTARIL Take 1 tablet (50 mg total) by mouth 2 (two) times daily as needed for itching.   insulin degludec 100 UNIT/ML Sopn FlexTouch Pen Commonly known as: TRESIBA Inject 0.6 mLs (60 Units total) into the skin 2 (two) times daily. Reports taking 24 units QAM What changed:   how much to take  when to take this  additional instructions   loperamide 2 MG capsule Commonly known as: IMODIUM Take 2 mg by mouth as needed for diarrhea or loose stools.   metoCLOPramide 10 MG tablet Commonly known as: REGLAN Take 1 tablet (10 mg total) by mouth 3 (three) times daily before meals.   NovoLOG FlexPen 100 UNIT/ML FlexPen Generic drug: insulin aspart Inject 12-15 Units into the skin See admin instructions. 5-6 times per day as needed for blood sugar management   oxyCODONE 5 MG immediate release tablet Commonly known as: Roxicodone Take 1 tablet (5 mg total) by mouth every 6 (six) hours as needed for severe pain.   pantoprazole 40 MG tablet Commonly known as: PROTONIX Take 40 mg by mouth 2 (two) times daily.   predniSONE 10 MG tablet Commonly known  as: DELTASONE Take 1 tablet (10 mg total) by mouth every morning.   promethazine 25 MG tablet Commonly known as: PHENERGAN Take 1 tablet (25 mg total) by mouth every 6 (six) hours as needed for nausea or vomiting.   sevelamer carbonate 0.8 g Pack packet Commonly known as: RENVELA Take 2 packets by mouth 3 (three) times daily with meals.   simvastatin 20 MG tablet Commonly known as: ZOCOR Take 20 mg by mouth at bedtime.   vancomycin 750 mg in sodium chloride 0.9 % 150 mL Inject 750 mg into the vein every Monday, Wednesday, and Friday with hemodialysis for 9 days. Till 11/10/18 Start taking on: November 01, 2018      Follow-up Information    Markus Daft, MD Follow  up.   Specialties: Interventional Radiology, Radiology Why: Schedulers will contact you with date and time of follow-up appointment Contact information: [address] [place] [address] [phone]        [name], MD. Schedule an appointment as soon as possible for a visit in 1 week(s).   Specialty: Internal Medicine Why: With repeat CBC/BMP Contact information: [address] [place] [address] [phone]        Surgeon Follow up.   Why: Make appointment at earliest convenience       Outpatient dialysis Follow up.   Why: As scheduled         Allergies  Allergen Reactions  . Benadryl [Diphenhydramine-Zinc Acetate] Shortness Of Breath  . Diphenhydramine Hcl Anaphylaxis    REACTION: Stopped breathing in ICU  . Doxycycline Shortness Of Breath  . Motrin [Ibuprofen] Shortness Of Breath and Itching    Per pt  . Banana Other (See Comments)    Sick on the stomach  . Iron Dextran Other (See Comments)    REACTION: vein irritation  . Shellfish Allergy Hives    Consultations:  Nephrology/general surgery/IR   Procedures/Studies: Dg Chest 2 View  Result Date: 10/27/2018 CLINICAL DATA:  Patient felt a seizure developing during dialysis today. EXAM: CHEST - 2 VIEW COMPARISON:  Chest x-rays dated 09/01/2018 and 05/01/2018. FINDINGS: Stable cardiomegaly. Overall cardiomediastinal silhouette is stable. Dialysis catheter appears stable in position with tip overlying the RIGHT atrium. Stable streaky opacities at the RIGHT lung base, likely atelectasis. No new lung findings. No pleural effusion or pneumothorax seen. Osseous structures about the chest are unremarkable. IMPRESSION: No active cardiopulmonary disease. Stable cardiomegaly. Stable atelectasis at the RIGHT lung base. No evidence of pneumonia or pulmonary edema. Electronically Signed   By: Franki Cabot M.D.   On: 10/27/2018 19:21   Ct Abdomen Pelvis W Contrast  Result Date:  10/27/2018 CLINICAL DATA:  Acute generalized abdominal pain. 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 EXAM: CT ABDOMEN AND PELVIS WITH CONTRAST TECHNIQUE: Multidetector CT imaging of the abdomen and pelvis was performed using the standard protocol following bolus administration of intravenous contrast. CONTRAST:  171mL OMNIPAQUE IOHEXOL 300 MG/ML  SOLN COMPARISON:  09/17/2018, additional priors. FINDINGS: Lower chest: Central catheter tip at the atrial caval junction. Increased lung base atelectasis from prior exam. Hepatobiliary: No focal liver abnormality is seen. Status post cholecystectomy. No biliary dilatation. Pancreas: No ductal dilatation or inflammation. Spleen: Upper normal in size without focal abnormality. Adrenals/Urinary Tract: Normal adrenal glands. Chronically atrophic left native kidney. The right kidney not visualized. In the location of the previous right iliac fossa renal transplant is a heterogeneous air-fluid collection with peripheral enhancement measuring 11.3 x 5.9 x 6.4 cm.  Urinary bladder is nondistended and not well evaluated. Stomach/Bowel: Bowel evaluation is limited in the absence of enteric contrast. Stomach is decompressed. No evidence of bowel obstruction, wall thickening or inflammation. Appendix tentatively identified and normal. Small volume of stool in the colon. Vascular/Lymphatic: Normal caliber abdominal aorta. The portal vein is patent. There multiple prominent retroperitoneal lymph nodes that are similar to prior exam. Enlarged right external iliac node measuring 13 mm short axis is adjacent to prior renal transplant. Additional prominent lymph nodes in the right inguinal region. Reproductive: Uterus not visualized.  No adnexal masses. Other: Recent midline and right lower abdominal wound. Mild edema and minimal scattered fluid along the incision site, but no drainable subcutaneous fluid collection. Trace perihepatic free fluid. No free air.  Musculoskeletal: No acute osseous abnormalities. Suggestion of mild renal osteodystrophy. IMPRESSION: 1. Heterogeneous air-fluid collection in the location of the previous right iliac fossa renal transplant measuring 11.3 x 5.9 x 6.4 cm, suspicious for abscess. 2. Enlarged right external iliac and right inguinal lymph nodes are likely reactive. 3. Recent midline and right lower abdominal surgical wound. Mild edema and minimal scattered fluid along the incision site, but no drainable subcutaneous fluid collection. Electronically Signed   By: Keith Rake M.D.   On: 10/27/2018 23:06   Ir Guided Niel Hummer W Catheter Placement  Result Date: 10/28/2018 INDICATION: 33 year old with end-stage renal disease. Patient had a transplant kidney removed from the right lower quadrant. Patient presents with abdominal pain and found to have a complex fluid collection at the old transplant site. EXAM: ULTRASOUND-GUIDED PLACEMENT OF A DRAINAGE CATHETER WITHIN THE RIGHT LOWER QUADRANT ABDOMINAL FLUID COLLECTION MEDICATIONS: Moderate sedation ANESTHESIA/SEDATION: Fentanyl 75 mcg IV; Versed 1.0 mg IV Moderate Sedation Time:  10 minutes The patient was continuously monitored during the procedure by the interventional radiology nurse under my direct supervision. COMPLICATIONS: None immediate. PROCEDURE: Informed written consent was obtained from the patient after a thorough discussion of the procedural risks, benefits and alternatives. All questions were addressed. Maximal Sterile Barrier Technique was utilized including caps, mask, sterile gowns, sterile gloves, sterile drape, hand hygiene and skin antiseptic. A timeout was performed prior to the initiation of the procedure. Ultrasound was used to identify the complex fluid collection in the right lower quadrant of the abdomen. The overlying skin was prepped with chlorhexidine and sterile field was created. Skin and soft tissues were anesthetized with 1% lidocaine. Using ultrasound  guidance, 18 gauge trocar needle was directed into the complex fluid collection and cloudy red fluid was aspirated. A stiff Amplatz wire was advanced into the collection. The tract was dilated to accommodate a 12 [place] multipurpose drain. Greater than 60 mL of cloudy red fluid was removed. The tube was slightly manipulated within the collection using ultrasound guidance in order to obtain more fluid. Catheter was sutured to skin and attached to a suction bulb. FINDINGS: Complex fluid collection in the right lower quadrant the abdomen with echogenic foci. Findings are compatible with air-fluid collection seen on the recent CT. Cloudy red fluid was removed from the collection, greater than 60 mL. Appearance of the fluid raises concern for an infected hematoma. Fluid was sent for culture. IMPRESSION: Ultrasound-guided placement of a drainage catheter within the right lower quadrant fluid collection. Cloudy red fluid was removed and sent for culture. Electronically Signed   By: Markus Daft M.D.   On: 10/28/2018 13:48       Subjective: Patient seen and examined at bedside.  She feels much better and wants to  go home.  Denies any overnight fever and nausea or vomiting.  Feels okay going home with the drain.  Discharge Exam: Vitals:   10/31/18 0600 10/31/18 0933  BP: 94/78 (!) 85/62  Pulse: 90 98  Resp: 18 18  Temp: 98.6 F (37 C) 98.6 F (37 C)  SpO2: 91% 96%    General exam: No acute distress.   Respiratory system: Bilateral decreased breath sounds at bases with some scattered crackles Cardiovascular system: S1 & S2 heard, Rate controlled Gastrointestinal system: Abdomen is nondistended, soft and mildly tender in the right lower quadrant with drain present. Normal bowel sounds heard. Extremities: No cyanosis.  Mild edema in lower extremities.     The results of significant diagnostics from this hospitalization (including imaging, microbiology, ancillary and laboratory) are listed below for  reference.     Microbiology: Recent Results (from the past 240 hour(s))  SARS CORONAVIRUS 2 Nasal Swab Aptima Multi Swab     Status: None   Collection Time: 10/24/18 12:10 PM   Specimen: Aptima Multi Swab; Nasal Swab  Result Value Ref Range Status   SARS Coronavirus 2 NEGATIVE NEGATIVE Final    Comment: (NOTE) SARS-CoV-2 target nucleic acids are NOT DETECTED. The SARS-CoV-2 RNA is generally detectable in upper and lower respiratory specimens during the acute phase of infection. Negative results do not preclude SARS-CoV-2 infection, do not rule out co-infections with other pathogens, and should not be used as the sole basis for treatment or other patient management decisions. Negative results must be combined with clinical observations, patient history, and epidemiological information. The expected result is Negative. Fact Sheet for Patients: SugarRoll.be Fact Sheet for Healthcare Providers: https://www.woods-mathews.com/ This test is not yet approved or cleared by the [place] FDA and  has been authorized for detection and/or diagnosis of SARS-CoV-2 by FDA under an Emergency Use Authorization (EUA). This EUA will remain  in effect (meaning this test can be used) for the duration of the COVID-19 declaration under Section 56 4(b)(1) of the Act, 21 U.S.C. section 360bbb-3(b)(1), unless the authorization is terminated or revoked sooner. Performed at [facility], [place] [address] [place], [place] [address]   SARS Coronavirus 2 [facility] order, Performed in [facility] hospital lab) Nasopharyngeal Nasopharyngeal Swab     Status: None   Collection Time: 10/28/18  1:55 AM   Specimen: Nasopharyngeal Swab  Result Value Ref Range Status   SARS Coronavirus 2 NEGATIVE NEGATIVE Final    Comment: (NOTE) If result is NEGATIVE SARS-CoV-2 target nucleic acids are NOT DETECTED. The SARS-CoV-2 RNA is generally detectable in upper and lower   respiratory specimens during the acute phase of infection. The lowest  concentration of SARS-CoV-2 viral copies this assay can detect is 250  copies / mL. A negative result does not preclude SARS-CoV-2 infection  and should not be used as the sole basis for treatment or other  patient management decisions.  A negative result may occur with  improper specimen collection / handling, submission of specimen other  than nasopharyngeal swab, presence of viral mutation(s) within the  areas targeted by this assay, and inadequate number of viral copies  (<250 copies / mL). A negative result must be combined with clinical  observations, patient history, and epidemiological information. If result is POSITIVE SARS-CoV-2 target nucleic acids are DETECTED. The SARS-CoV-2 RNA is generally detectable in upper and lower  respiratory specimens dur ing the acute phase of infection.  Positive  results are indicative of active infection with SARS-CoV-2.  Clinical  correlation with patient history and other diagnostic information is  necessary to determine patient infection status.  Positive results do  not rule out bacterial infection or co-infection with other viruses. If result is PRESUMPTIVE POSTIVE SARS-CoV-2 nucleic acids MAY BE PRESENT.   A presumptive positive result was obtained on the submitted specimen  and confirmed on repeat testing.  While 2019 novel coronavirus  (SARS-CoV-2) nucleic acids may be present in the submitted sample  additional confirmatory testing may be necessary for epidemiological  and / or clinical management purposes  to differentiate between  SARS-CoV-2 and other Sarbecovirus currently known to infect humans.  If clinically indicated additional testing with an alternate test  methodology [id number]) is advised. The SARS-CoV-2 RNA is generally  detectable in upper and lower respiratory sp ecimens during the acute  phase of infection. The expected result is Negative. Fact  Sheet for Patients:  StrictlyIdeas.no Fact Sheet for Healthcare Providers: BankingDealers.co.za This test is not yet approved or cleared by the [place] FDA and has been authorized for detection and/or diagnosis of SARS-CoV-2 by FDA under an Emergency Use Authorization (EUA).  This EUA will remain in effect (meaning this test can be used) for the duration of the COVID-19 declaration under Section 564(b)(1) of the Act, 21 U.S.C. section 360bbb-3(b)(1), unless the authorization is terminated or revoked sooner. Performed at [facility], [place] [address], [place], [place] [address]   MRSA PCR Screening     Status: None   Collection Time: 10/28/18  6:51 AM   Specimen: Nasopharyngeal  Result Value Ref Range Status   MRSA by PCR NEGATIVE NEGATIVE Final    Comment:        The GeneXpert MRSA Assay (FDA approved for NASAL specimens only), is one component of a comprehensive MRSA colonization surveillance program. It is not intended to diagnose MRSA infection nor to guide or monitor treatment for MRSA infections. Performed at [facility], [place] [address] [place], [place] [address]   Aerobic/Anaerobic Culture (surgical/deep wound)     Status: None (Preliminary result)   Collection Time: 10/28/18  1:35 PM   Specimen: Abscess  Result Value Ref Range Status   Specimen Description ABSCESS  Final   Special Requests NONE  Final   Gram Stain   Final    ABUNDANT WBC PRESENT, PREDOMINANTLY PMN FEW GRAM POSITIVE COCCI Performed at [facility], [place] [address] [place], [place] [address]    Culture   Final    FEW NORMAL SKIN FLORA NO ANAEROBES ISOLATED; CULTURE IN PROGRESS FOR 5 DAYS    Report Status PENDING  Incomplete     Labs: BNP (last 3 results) Recent Labs    03/01/18 1100 05/01/18 1013 09/01/18 1324  BNP 63.3 433.7* 0000000*   Basic Metabolic Panel: Recent Labs  Lab 10/27/18 1840 10/28/18 0635  10/29/18 0706 10/30/18 0617 10/31/18 0619  NA 136 135 129* 131* 135  K 3.6 3.7 4.0 3.4* 3.6  CL 96* 96* 93* 96* 100  CO2 25 24 22 22 22   GLUCOSE 190* 230* 456* 365* 216*  BUN 9 13 30* 43* 21*  CREATININE 3.69* 5.29* 7.41* 9.28* 6.31*  CALCIUM 9.9 9.1 7.8* 7.1* 7.1*  MG  --   --  1.9 2.0  --   PHOS  --   --   --  2.5  --    Liver Function Tests: Recent Labs  Lab 10/27/18 1840 10/28/18 0635 10/29/18 0706 10/30/18 0617  AST 125*  178* 82*  --   ALT 30 37 25  --   ALKPHOS 108 92 86  --   BILITOT 1.0 1.2 0.5  --   PROT 7.2 6.8 6.4*  --   ALBUMIN 2.7* 2.5* 2.3* 2.2*   No results for input(s): LIPASE, AMYLASE in the last 168 hours. No results for input(s): AMMONIA in the last 168 hours. CBC: Recent Labs  Lab 10/27/18 1840 10/28/18 0635 10/29/18 0706 10/30/18 0617 10/31/18 0619  WBC 5.9 5.7 5.5 7.4 8.1  NEUTROABS 3.4  --  3.2 4.2 4.2  HGB 6.9* 7.6* 7.4* 7.5* 8.7*  HCT 23.7* 25.2* 24.3* 25.0* 29.9*  MCV 90.1 90.0 89.7 90.6 91.4  PLT 304 290 259 272 265   Cardiac Enzymes: No results for input(s): CKTOTAL, CKMB, CKMBINDEX, TROPONINI in the last 168 hours. BNP: Invalid input(s): POCBNP CBG: Recent Labs  Lab 10/30/18 0653 10/30/18 1212 10/30/18 1612 10/30/18 2109 10/31/18 0719  GLUCAP 377* 272* 301* 114* 199*   D-Dimer No results for input(s): DDIMER in the last 72 hours. Hgb A1c No results for input(s): HGBA1C in the last 72 hours. Lipid Profile No results for input(s): CHOL, HDL, LDLCALC, TRIG, CHOLHDL, LDLDIRECT in the last 72 hours. Thyroid function studies No results for input(s): TSH, T4TOTAL, T3FREE, THYROIDAB in the last 72 hours.  Invalid input(s): FREET3 Anemia work up No results for input(s): VITAMINB12, FOLATE, FERRITIN, TIBC, IRON, RETICCTPCT in the last 72 hours. Urinalysis    Component Value Date/Time   COLORURINE RED (A) 09/03/2018 1118   APPEARANCEUR CLOUDY (A) 09/03/2018 1118   LABSPEC 1.015 09/03/2018 1118   PHURINE 8.0 09/03/2018  1118   GLUCOSEU 250 (A) 09/03/2018 1118   HGBUR LARGE (A) 09/03/2018 1118   HGBUR negative 01/26/2010 1026   BILIRUBINUR NEGATIVE 09/03/2018 1118   BILIRUBINUR NEG 01/01/2011 [place] 09/03/2018 1118   PROTEINUR >300 (A) 09/03/2018 1118   UROBILINOGEN 0.2 01/09/2015 1333   NITRITE POSITIVE (A) 09/03/2018 1118   LEUKOCYTESUR TRACE (A) 09/03/2018 1118   Sepsis Labs Invalid input(s): PROCALCITONIN,  WBC,  LACTICIDVEN Microbiology Recent Results (from the past 240 hour(s))  SARS CORONAVIRUS 2 Nasal Swab Aptima Multi Swab     Status: None   Collection Time: 10/24/18 12:10 PM   Specimen: Aptima Multi Swab; Nasal Swab  Result Value Ref Range Status   SARS Coronavirus 2 NEGATIVE NEGATIVE Final    Comment: (NOTE) SARS-CoV-2 target nucleic acids are NOT DETECTED. The SARS-CoV-2 RNA is generally detectable in upper and lower respiratory specimens during the acute phase of infection. Negative results do not preclude SARS-CoV-2 infection, do not rule out co-infections with other pathogens, and should not be used as the sole basis for treatment or other patient management decisions. Negative results must be combined with clinical observations, patient history, and epidemiological information. The expected result is Negative. Fact Sheet for Patients: SugarRoll.be Fact Sheet for Healthcare Providers: https://www.woods-mathews.com/ This test is not yet approved or cleared by the [place] FDA and  has been authorized for detection and/or diagnosis of SARS-CoV-2 by FDA under an Emergency Use Authorization (EUA). This EUA will remain  in effect (meaning this test can be used) for the duration of the COVID-19 declaration under Section 56 4(b)(1) of the Act, 21 U.S.C. section 360bbb-3(b)(1), unless the authorization is terminated or revoked sooner. Performed at [facility], [place] [address], [place], [place] [address]   SARS  Coronavirus 2 [facility] order, Performed in [facility] hospital lab) Nasopharyngeal Nasopharyngeal  Swab     Status: None   Collection Time: 10/28/18  1:55 AM   Specimen: Nasopharyngeal Swab  Result Value Ref Range Status   SARS Coronavirus 2 NEGATIVE NEGATIVE Final    Comment: (NOTE) If result is NEGATIVE SARS-CoV-2 target nucleic acids are NOT DETECTED. The SARS-CoV-2 RNA is generally detectable in upper and lower  respiratory specimens during the acute phase of infection. The lowest  concentration of SARS-CoV-2 viral copies this assay can detect is 250  copies / mL. A negative result does not preclude SARS-CoV-2 infection  and should not be used as the sole basis for treatment or other  patient management decisions.  A negative result may occur with  improper specimen collection / handling, submission of specimen other  than nasopharyngeal swab, presence of viral mutation(s) within the  areas targeted by this assay, and inadequate number of viral copies  (<250 copies / mL). A negative result must be combined with clinical  observations, patient history, and epidemiological information. If result is POSITIVE SARS-CoV-2 target nucleic acids are DETECTED. The SARS-CoV-2 RNA is generally detectable in upper and lower  respiratory specimens dur ing the acute phase of infection.  Positive  results are indicative of active infection with SARS-CoV-2.  Clinical  correlation with patient history and other diagnostic information is  necessary to determine patient infection status.  Positive results do  not rule out bacterial infection or co-infection with other viruses. If result is PRESUMPTIVE POSTIVE SARS-CoV-2 nucleic acids MAY BE PRESENT.   A presumptive positive result was obtained on the submitted specimen  and confirmed on repeat testing.  While 2019 novel coronavirus  (SARS-CoV-2) nucleic acids may be present in the submitted sample  additional confirmatory testing may be necessary  for epidemiological  and / or clinical management purposes  to differentiate between  SARS-CoV-2 and other Sarbecovirus currently known to infect humans.  If clinically indicated additional testing with an alternate test  methodology [id number]) is advised. The SARS-CoV-2 RNA is generally  detectable in upper and lower respiratory sp ecimens during the acute  phase of infection. The expected result is Negative. Fact Sheet for Patients:  StrictlyIdeas.no Fact Sheet for Healthcare Providers: BankingDealers.co.za This test is not yet approved or cleared by the [place] FDA and has been authorized for detection and/or diagnosis of SARS-CoV-2 by FDA under an Emergency Use Authorization (EUA).  This EUA will remain in effect (meaning this test can be used) for the duration of the COVID-19 declaration under Section 564(b)(1) of the Act, 21 U.S.C. section 360bbb-3(b)(1), unless the authorization is terminated or revoked sooner. Performed at [facility], [place] [address] [place], [place] [address]   MRSA PCR Screening     Status: None   Collection Time: 10/28/18  6:51 AM   Specimen: Nasopharyngeal  Result Value Ref Range Status   MRSA by PCR NEGATIVE NEGATIVE Final    Comment:        The GeneXpert MRSA Assay (FDA approved for NASAL specimens only), is one component of a comprehensive MRSA colonization surveillance program. It is not intended to diagnose MRSA infection nor to guide or monitor treatment for MRSA infections. Performed at [facility], [place] [address] [place], [place] [address]   Aerobic/Anaerobic Culture (surgical/deep wound)     Status: None (Preliminary result)   Collection Time: 10/28/18  1:35 PM   Specimen: Abscess  Result Value Ref Range Status   Specimen Description ABSCESS  Final   Special Requests NONE  Final   Gram Stain   Final    ABUNDANT WBC PRESENT, PREDOMINANTLY PMN FEW GRAM POSITIVE  COCCI Performed at [facility], [place] [address] [place], [place] [address]    Culture   Final    FEW NORMAL SKIN FLORA NO ANAEROBES ISOLATED; CULTURE IN PROGRESS FOR 5 DAYS    Report Status PENDING  Incomplete     Time coordinating discharge: 35 minutes  SIGNED:   [name], MD  [facility] 10/31/2018, 10:10 AM

## 2018-10-31 NOTE — Progress Notes (Signed)
Pharmacy Antibiotic Note  [name] is a 33 y.o. female admitted on 10/27/2018 with intraabdominal abcess.  Pharmacy has been consulted for Vancomycin & Meropenem dosing.  The patient is ESRD-MWF and has been receiving antibiotic doses appropriately. Noted plans for discharge today and to continue Vancomycin but transition to Ceftazidime. Dosing would continue with Vancomycin 750 mg post HD and Ceftazidime would be 2g post HD.   Plan: - Continue Vancomycin 750 mg post HD on MWF - Continue Meropenem 500 mg IV every 24 hours - if stays, will adjust to be given at bedtime - Noted plans for likely discharge today, to continue Vancomycin and transition Mero >> Cefazidime to be given at the HD center.  - Will continue to follow HD schedule/duration, culture results, LOT, and antibiotic de-escalation plans    Temp (24hrs), Avg:98.4 F (36.9 C), Min:97.7 F (36.5 C), Max:98.6 F (37 C)  Recent Labs  Lab 10/27/18 1840 10/28/18 0635 10/29/18 0706 10/30/18 0617 10/31/18 0619  WBC 5.9 5.7 5.5 7.4 8.1  CREATININE 3.69* 5.29* 7.41* 9.28* 6.31*  LATICACIDVEN 1.2  --   --   --   --     Estimated Creatinine Clearance: 13 mL/min (A) (by C-G formula based on SCr of 6.31 mg/dL (H)).    Allergies  Allergen Reactions  . Benadryl [Diphenhydramine-Zinc Acetate] Shortness Of Breath  . Diphenhydramine Hcl Anaphylaxis    REACTION: Stopped breathing in ICU  . Doxycycline Shortness Of Breath  . Motrin [Ibuprofen] Shortness Of Breath and Itching    Per pt  . Banana Other (See Comments)    Sick on the stomach  . Iron Dextran Other (See Comments)    REACTION: vein irritation  . Shellfish Allergy Hives    Antimicrobials this admission: Meropenem 8/15 >>  Vancomycin 8/15 >>   Microbiology results: 8/15 abscess cx >> normal skin flora 8/15 MRSA PCR negative  8/15 COVID neg  Thank you for allowing pharmacy to be a part of this patient's care.  Alycia Rossetti, PharmD, BCPS Clinical  Pharmacist Clinical phone for 10/31/2018: [phone] 10/31/2018 10:19 AM   **Pharmacist phone directory can now be found on [email or web address] ([email or web address]).  Listed under [place].

## 2018-10-31 NOTE — Progress Notes (Signed)
[facility] Progress Note     Subjective: CC-  Up in chair. Wants to go home. Denies abdominal pain. Tolerating diet. BM this morning. WBC 8.1, afebrile  Objective: Vital signs in last 24 hours: Temp:  [97.7 F (36.5 C)-98.6 F (37 C)] 98.6 F (37 C) (08/18 0600) Pulse Rate:  [88-103] 90 (08/18 0600) Resp:  [13-20] 18 (08/18 0600) BP: (85-122)/(58-92) 94/78 (08/18 0600) SpO2:  [91 %-97 %] 91 % (08/18 0600) Weight:  [76.8 kg] 76.8 kg (08/17 1139) Last BM Date: 10/29/18  Intake/Output from previous day: 08/17 0701 - 08/18 0700 In: 575 [P.O.:340; IV Piggyback:230] Out: 3600  Intake/Output this shift: No intake/output data recorded.  PE: Gen: Alert, NAD HEENT: pupils equal and round Pulm:Rate andeffort normal Abd: Soft,nondistended, well healed midline and RLQ incisions.nontender. No peritonitis. Drain with serous output Psych: A&Ox3  Skin: no rashes noted, warm and dry  Lab Results:  Recent Labs    10/30/18 0617 10/31/18 0619  WBC 7.4 8.1  HGB 7.5* 8.7*  HCT 25.0* 29.9*  PLT 272 265   BMET Recent Labs    10/30/18 0617 10/31/18 0619  NA 131* 135  K 3.4* 3.6  CL 96* 100  CO2 22 22  GLUCOSE 365* 216*  BUN 43* 21*  CREATININE 9.28* 6.31*  CALCIUM 7.1* 7.1*   PT/INR Recent Labs    10/28/18 0919  LABPROT 16.2*  INR 1.3*   CMP     Component Value Date/Time   NA 135 10/31/2018 0619   K 3.6 10/31/2018 0619   CL 100 10/31/2018 0619   CO2 22 10/31/2018 0619   GLUCOSE 216 (H) 10/31/2018 0619   BUN 21 (H) 10/31/2018 0619   CREATININE 6.31 (H) 10/31/2018 0619   CALCIUM 7.1 (L) 10/31/2018 0619   CALCIUM 5.7 (LL) 03/20/2018 1915   PROT 6.4 (L) 10/29/2018 0706   ALBUMIN 2.2 (L) 10/30/2018 0617   AST 82 (H) 10/29/2018 0706   ALT 25 10/29/2018 0706   ALKPHOS 86 10/29/2018 0706   BILITOT 0.5 10/29/2018 0706   GFRNONAA 8 (L) 10/31/2018 0619   GFRAA 9 (L) 10/31/2018 0619   Lipase     Component Value Date/Time   LIPASE 43  09/17/2018 0417       Studies/Results: No results found.  Anti-infectives: Anti-infectives (From admission, onward)   Start     Dose/Rate Route Frequency Ordered Stop   10/30/18 1200  vancomycin (VANCOCIN) IVPB 750 mg/150 ml premix     750 mg 150 mL/hr over 60 Minutes Intravenous Every M-W-F (Hemodialysis) 10/28/18 0854     10/30/18 0914  Vancomycin (VANCOCIN) 750-5 MG/150ML-% IVPB    Note to Pharmacy: Rodell Perna   : cabinet override      10/30/18 0914 10/30/18 2129   10/28/18 1000  vancomycin (VANCOCIN) 1,750 mg in sodium chloride 0.9 % 500 mL IVPB     1,750 mg 250 mL/hr over 120 Minutes Intravenous  Once 10/28/18 0854 10/28/18 1335   10/28/18 0600  meropenem (MERREM) 500 mg in sodium chloride 0.9 % 100 mL IVPB     500 mg 200 mL/hr over 30 Minutes Intravenous Daily 10/28/18 0017     10/27/18 2330  piperacillin-tazobactam (ZOSYN) IVPB 3.375 g     3.375 g 100 mL/hr over 30 Minutes Intravenous  Once 10/27/18 2316 10/28/18 0038       Assessment/Plan ESRD - HDMWF HTN DM Chronic pain Anemia of chronic disease   Abscess in right iliac fossa at the site of recent  [facility] - S/p IR drain placement 8/15. Drain management per IR - Culture pending but currently just normal flora. Antibiotics per medicine - Patient should follow up with her original surgeon after discharge.  ID -merrem 8/15>>, vancomycin 8/15>> FEN -IVF, renal/CMdiet VTE -SCDs, sq heparin Foley -none Follow up -TBD  Plan: okay for discharge from our standpoint. Continue drain and abx. Patient should follow up with her original surgeon in [place].  We will sign off, please call with concerns.   LOS: 3 days    Wellington Hampshire , [facility] 10/31/2018, 8:59 AM Pager: [phone] Mon-Thurs 7:00 am-4:30 pm Fri 7:00 am -11:30 AM Sat-Sun 7:00 am-11:30 am

## 2018-10-31 NOTE — Progress Notes (Signed)
DISCHARGE NOTE HOME [name] to be discharged Home per MD order. Discussed prescriptions and follow up appointments with the patient. Prescriptions given to patient; medication list explained in detail. Patient verbalized understanding.  Skin clean, dry and intact without evidence of skin break down, no evidence of skin tears noted. IV catheter discontinued intact. Site without signs and symptoms of complications. Dressing and pressure applied. Pt denies pain at the site currently. No complaints noted.  Patient free of lines, drains, and wounds.   An After Visit Summary (AVS) was printed and given to the patient. Patient escorted via wheelchair, and discharged home via private auto.  Arlyss Repress, RN

## 2018-10-31 NOTE — Progress Notes (Signed)
Subjective:  No cos, noted for DC home today ,  Objective Vital signs in last 24 hours: Vitals:   10/30/18 2110 10/30/18 2334 10/31/18 0600 10/31/18 0933  BP: 98/73  94/78 (!) 85/62  Pulse: (!) 103 96 90 98  Resp: 18 20 18 18   Temp: 98.3 F (36.8 C)  98.6 F (37 C) 98.6 F (37 C)  TempSrc:   Oral Oral  SpO2: 91% 97% 91% 96%  Weight:       Weight change:   Physical Exam: General: alert female nad  Heart: RRR no r,m, g Lungs: CTA  Abdomen: BSpos. Soft , NT, ND RL Quad wound bandaged with drain inplace Extremities:  No pedal edema  Dialysis Access: RIJ Perm cath / LUA  AVF pos bruit  (1st stage )   OP Dialysis: [facility] M[facility]  4h  350 /800  76.5kg  2K/3.5Ca P4  Hep 4K+  2K midrun TDC/ maturing AVF  Mircera 225 IV q 2 weeks (dosed 8/10)  Problem/Plan:  1. Abd pain/fevers 2/2 Intra- Abdominal Abscess with  recent transplant nephrectomy: .S/p IR drain of abscess -For DC today  On Vancomycin/ Fortaz / I will notify center of antibiotic needs Culture results=8/15 MRSA PCR negative 8/15 COVID neg8/15 MRSA PCR negative 8/15 COVID neg.  2. ESRD -MWF HD. On schedule ,next op center tomor . 3. Hypertension/volume - BP controlled. No volume excess on exam.wt yest post hd at edw  4. Anemia - admit Hgb 6.9 s/p 1 unit prbc on 8/14.> 8.7 today On max ESA as outpatient. Follow trends. 5. Metabolic bone disease -Using Added Ca++ bath as outpatient, follow trends.Renvelabinders + Tums as outpatient.noted phos 2.5 today fu trend at op unit  6. Nutrition -Renal diet, supplements when eating  7. DMType 1- Insulin per primary   Ernest Haber, PA-C [facility] [phone] 10/31/2018,12:25 PM  LOS: 3 days   Labs: Basic Metabolic Panel: Recent Labs  Lab 10/29/18 0706 10/30/18 0617 10/31/18 0619  NA 129* 131* 135  K 4.0 3.4* 3.6  CL 93* 96* 100  CO2 22 22 22   GLUCOSE 456* 365* 216*  BUN 30* 43* 21*  CREATININE 7.41* 9.28* 6.31*  CALCIUM 7.8* 7.1*  7.1*  PHOS  --  2.5  --    Liver Function Tests: Recent Labs  Lab 10/27/18 1840 10/28/18 0635 10/29/18 0706 10/30/18 0617  AST 125* 178* 82*  --   ALT 30 37 25  --   ALKPHOS 108 92 86  --   BILITOT 1.0 1.2 0.5  --   PROT 7.2 6.8 6.4*  --   ALBUMIN 2.7* 2.5* 2.3* 2.2*   No results for input(s): LIPASE, AMYLASE in the last 168 hours. No results for input(s): AMMONIA in the last 168 hours. CBC: Recent Labs  Lab 10/27/18 1840 10/28/18 0635 10/29/18 0706 10/30/18 0617 10/31/18 0619  WBC 5.9 5.7 5.5 7.4 8.1  NEUTROABS 3.4  --  3.2 4.2 4.2  HGB 6.9* 7.6* 7.4* 7.5* 8.7*  HCT 23.7* 25.2* 24.3* 25.0* 29.9*  MCV 90.1 90.0 89.7 90.6 91.4  PLT 304 290 259 272 265   Cardiac Enzymes: No results for input(s): CKTOTAL, CKMB, CKMBINDEX, TROPONINI in the last 168 hours. CBG: Recent Labs  Lab 10/30/18 1212 10/30/18 1612 10/30/18 2109 10/31/18 0719 10/31/18 1214  GLUCAP 272* 301* 114* 199* 166*    Studies/Results: No results found. Medications: . meropenem (MERREM) IV 500 mg (10/31/18 1041)  . vancomycin Stopped (10/30/18 1131)   . sodium  chloride   Intravenous Once  . amLODipine  10 mg Oral Daily  . calcium carbonate (dosed in mg elemental calcium)  500 mg of elemental calcium Oral BID  . dicyclomine  20 mg Oral BID  . gabapentin  300 mg Oral BID  . heparin  5,000 Units Subcutaneous Q8H  . insulin aspart  0-5 Units Subcutaneous QHS  . insulin aspart  0-9 Units Subcutaneous TID WC  . insulin aspart  10 Units Subcutaneous TID WC  . insulin glargine  25 Units Subcutaneous Daily  . loratadine  10 mg Oral Daily  . metoCLOPramide  10 mg Oral TID AC  . multivitamin  1 tablet Oral QHS  . pantoprazole  40 mg Oral BID  . predniSONE  10 mg Oral Q breakfast  . sevelamer carbonate  1.6 g Oral TID WC  . silver sulfADIAZINE   Topical BID  . simvastatin  20 mg Oral QHS  . sodium chloride flush  5 mL Intracatheter Q8H

## 2018-10-31 NOTE — Plan of Care (Signed)
  Problem: Nutrition: Goal: Adequate nutrition will be maintained Outcome: Progressing   

## 2018-11-01 ENCOUNTER — Other Ambulatory Visit: Payer: Self-pay | Admitting: General Surgery

## 2018-11-01 DIAGNOSIS — N151 Renal and perinephric abscess: Secondary | ICD-10-CM | POA: Insufficient documentation

## 2018-11-01 DIAGNOSIS — K651 Peritoneal abscess: Secondary | ICD-10-CM

## 2018-11-01 HISTORY — DX: Renal and perinephric abscess: N15.1

## 2018-11-02 LAB — AEROBIC/ANAEROBIC CULTURE W GRAM STAIN (SURGICAL/DEEP WOUND): Culture: NORMAL

## 2018-11-08 ENCOUNTER — Encounter: Payer: Medicare Other | Attending: Internal Medicine | Admitting: Internal Medicine

## 2018-11-08 ENCOUNTER — Other Ambulatory Visit: Payer: Self-pay

## 2018-11-08 DIAGNOSIS — G40909 Epilepsy, unspecified, not intractable, without status epilepticus: Secondary | ICD-10-CM | POA: Insufficient documentation

## 2018-11-08 DIAGNOSIS — M1990 Unspecified osteoarthritis, unspecified site: Secondary | ICD-10-CM | POA: Insufficient documentation

## 2018-11-08 DIAGNOSIS — Z881 Allergy status to other antibiotic agents status: Secondary | ICD-10-CM | POA: Insufficient documentation

## 2018-11-08 DIAGNOSIS — Z888 Allergy status to other drugs, medicaments and biological substances status: Secondary | ICD-10-CM | POA: Diagnosis not present

## 2018-11-08 DIAGNOSIS — N186 End stage renal disease: Secondary | ICD-10-CM | POA: Diagnosis not present

## 2018-11-08 DIAGNOSIS — D649 Anemia, unspecified: Secondary | ICD-10-CM | POA: Diagnosis not present

## 2018-11-08 DIAGNOSIS — Z833 Family history of diabetes mellitus: Secondary | ICD-10-CM | POA: Insufficient documentation

## 2018-11-08 DIAGNOSIS — E1022 Type 1 diabetes mellitus with diabetic chronic kidney disease: Secondary | ICD-10-CM | POA: Insufficient documentation

## 2018-11-08 DIAGNOSIS — L97521 Non-pressure chronic ulcer of other part of left foot limited to breakdown of skin: Secondary | ICD-10-CM | POA: Insufficient documentation

## 2018-11-08 DIAGNOSIS — Z880 Allergy status to penicillin: Secondary | ICD-10-CM | POA: Insufficient documentation

## 2018-11-08 DIAGNOSIS — I120 Hypertensive chronic kidney disease with stage 5 chronic kidney disease or end stage renal disease: Secondary | ICD-10-CM | POA: Insufficient documentation

## 2018-11-08 DIAGNOSIS — Z8249 Family history of ischemic heart disease and other diseases of the circulatory system: Secondary | ICD-10-CM | POA: Diagnosis not present

## 2018-11-08 DIAGNOSIS — Z794 Long term (current) use of insulin: Secondary | ICD-10-CM | POA: Insufficient documentation

## 2018-11-08 DIAGNOSIS — D631 Anemia in chronic kidney disease: Secondary | ICD-10-CM | POA: Diagnosis not present

## 2018-11-08 DIAGNOSIS — Z940 Kidney transplant status: Secondary | ICD-10-CM | POA: Diagnosis not present

## 2018-11-08 DIAGNOSIS — E10621 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: Secondary | ICD-10-CM | POA: Diagnosis not present

## 2018-11-08 DIAGNOSIS — Z992 Dependence on renal dialysis: Secondary | ICD-10-CM | POA: Diagnosis not present

## 2018-11-08 DIAGNOSIS — E1042 Type 1 diabetes mellitus with diabetic polyneuropathy: Secondary | ICD-10-CM | POA: Diagnosis not present

## 2018-11-08 NOTE — Progress Notes (Signed)
KAHO, KUSSMAN ([id number]) Visit Report for 11/08/2018 Abuse/Suicide Risk Screen Details Patient Name: [name] Date of Service: 11/08/2018 2:15 PM Medical Record Number: [id number] Patient Account Number: [id number] Date of Birth/Sex: 26-Mar-1985 (33 y.o. F) Treating RN: Army Melia Primary Care Refugio Vandevoorde: [name] Other Clinician: Referring Kaleen Rochette: Referral, Self Treating Barnett Elzey/Extender: Tito Dine in Treatment: 0 Abuse/Suicide Risk Screen Items Answer ABUSE RISK SCREEN: Has anyone close to you tried to hurt or harm you recentlyo No Do you feel uncomfortable with anyone in your familyo No Has anyone forced you do things that you didnot want to doo No Electronic Signature(s) Signed: 11/08/2018 4:18:00 PM By: Army Melia Entered By: Army Melia on 11/08/2018 14:36:53 [name] ([id number]) -------------------------------------------------------------------------------- Activities of Daily Living Details Patient Name: [name] Date of Service: 11/08/2018 2:15 PM Medical Record Number: [id number] Patient Account Number: [id number] Date of Birth/Sex: [birth year] (33 y.o. F) Treating RN: Army Melia Primary Care Asahd Can: [name] Other Clinician: Referring Quentez Lober: Referral, Self Treating Brystol Wasilewski/Extender: Tito Dine in Treatment: 0 Activities of Daily Living Items Answer Activities of Daily Living (Please select one for each item) Drive Automobile Completely Able Take Medications Completely Able Use Telephone Completely Able Care for Appearance Completely Able Use Toilet Completely Able Bath / Shower Completely Able Dress Self Completely Able Feed Self Completely Able Walk Completely Able Get In / Out Bed Completely Able Housework Completely Able Prepare Meals Completely Tennille for Self Completely Able Electronic Signature(s) Signed: 11/08/2018 4:18:00 PM By: Army Melia Entered  By: Army Melia on 11/08/2018 14:37:13 [name] ([id number]) -------------------------------------------------------------------------------- Education Screening Details Patient Name: [name] Date of Service: 11/08/2018 2:15 PM Medical Record Number: [id number] Patient Account Number: [id number] Date of Birth/Sex: 1986-03-10 (33 y.o. F) Treating RN: Army Melia Primary Care Jordani Nunn: [name] Other Clinician: Referring Joelle Flessner: Referral, Self Treating Kaedence Connelly/Extender: Tito Dine in Treatment: 0 Primary Learner Assessed: Patient Learning Preferences/Education Level/Primary Language Learning Preference: Explanation, Demonstration Highest Education Level: College or Above Preferred Language: English Cognitive Barrier Language Barrier: No Translator Needed: No Memory Deficit: No Emotional Barrier: No Cultural/Religious Beliefs Affecting Medical Care: No Physical Barrier Impaired Vision: No Impaired Hearing: No Decreased Hand dexterity: No Knowledge/Comprehension Knowledge Level: High Comprehension Level: High Ability to understand written High instructions: Ability to understand verbal High instructions: Motivation Anxiety Level: Calm Cooperation: Cooperative Education Importance: Acknowledges Need Interest in Health Problems: Asks Questions Perception: Coherent Willingness to Engage in Self- High Management Activities: Readiness to Engage in Self- High Management Activities: Electronic Signature(s) Signed: 11/08/2018 4:18:00 PM By: Army Melia Entered By: Army Melia on 11/08/2018 14:37:31 [name] ([id number]) -------------------------------------------------------------------------------- Fall Risk Assessment Details Patient Name: [name] Date of Service: 11/08/2018 2:15 PM Medical Record Number: [id number] Patient Account Number: [id number] Date of Birth/Sex: 1985/09/30 (33 y.o. F) Treating RN: Army Melia Primary Care Kesley Mullens: [name] Other Clinician: Referring Barnes Florek: Referral, Self Treating Ediberto Sens/Extender: Tito Dine in Treatment: 0 Fall Risk Assessment Items Have you had 2 or more falls in the last 12 monthso 0 No Have you had any fall that resulted in injury in the last 12 monthso 0 No FALLS RISK SCREEN History of falling - immediate or within 3 months 0 No Secondary diagnosis (Do you have 2 or more medical diagnoseso) 0 No Ambulatory aid None/bed rest/wheelchair/nurse 0 No Crutches/cane/walker 0 No Furniture 0 No Intravenous therapy Access/Saline/Heparin Lock 0 No Gait/Transferring Normal/ bed rest/ wheelchair 0 No  Weak (short steps with or without shuffle, stooped but able to lift head while 0 No walking, may seek support from furniture) Impaired (short steps with shuffle, may have difficulty arising from chair, head 0 No down, impaired balance) Mental Status Oriented to own ability 0 No Electronic Signature(s) Signed: 11/08/2018 4:18:00 PM By: Army Melia Entered By: Army Melia on 11/08/2018 14:37:38 [name] ([id number]) -------------------------------------------------------------------------------- Foot Assessment Details Patient Name: [name] Date of Service: 11/08/2018 2:15 PM Medical Record Number: [id number] Patient Account Number: [id number] Date of Birth/Sex: 07-May-1985 (33 y.o. F) Treating RN: Army Melia Primary Care Colbi Schiltz: [name] Other Clinician: Referring Azael Ragain: Referral, Self Treating Zyair Russi/Extender: Tito Dine in Treatment: 0 Foot Assessment Items Site Locations + = Sensation present, - = Sensation absent, C = Callus, U = Ulcer R = Redness, W = Warmth, M = Maceration, PU = Pre-ulcerative lesion F = Fissure, S = Swelling, D = Dryness Assessment Right: Left: Other Deformity: No No Prior Foot Ulcer: No No Prior Amputation: No No Charcot Joint: No No Ambulatory Status:  Ambulatory Without Help Gait: Steady Electronic Signature(s) Signed: 11/08/2018 4:18:00 PM By: Army Melia Entered By: Army Melia on 11/08/2018 14:38:20 [name] ([id number]) -------------------------------------------------------------------------------- Nutrition Risk Screening Details Patient Name: [name] Date of Service: 11/08/2018 2:15 PM Medical Record Number: [id number] Patient Account Number: [id number] Date of Birth/Sex: 09/09/1985 (33 y.o. F) Treating RN: Army Melia Primary Care Tami Blass: [name] Other Clinician: Referring Phillis Thackeray: Referral, Self Treating Jubilee Vivero/Extender: Tito Dine in Treatment: 0 Height (in): 66 Weight (lbs): 175 Body Mass Index (BMI): 28.2 Nutrition Risk Screening Items Score Screening NUTRITION RISK SCREEN: I have an illness or condition that made me change the kind and/or amount of 0 No food I eat I eat fewer than two meals per day 0 No I eat few fruits and vegetables, or milk products 0 No I have three or more drinks of beer, liquor or wine almost every day 0 No I have tooth or mouth problems that make it hard for me to eat 0 No I don't always have enough money to buy the food I need 0 No I eat alone most of the time 0 No I take three or more different prescribed or over-the-counter drugs a day 0 No Without wanting to, I have lost or gained 10 pounds in the last six months 0 No I am not always physically able to shop, [place] and/or feed myself 0 No Nutrition Protocols Good Risk Protocol 0 No interventions needed Moderate Risk Protocol High Risk Proctocol Risk Level: Good Risk Score: 0 Electronic Signature(s) Signed: 11/08/2018 4:18:00 PM By: Army Melia Entered By: Army Melia on 11/08/2018 14:37:44
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
## 2018-11-09 ENCOUNTER — Other Ambulatory Visit: Payer: Medicare Other

## 2018-11-09 ENCOUNTER — Inpatient Hospital Stay: Admission: RE | Admit: 2018-11-09 | Payer: Medicare Other | Source: Ambulatory Visit

## 2018-11-15 ENCOUNTER — Other Ambulatory Visit: Payer: Self-pay

## 2018-11-15 ENCOUNTER — Encounter: Payer: Medicare Other | Attending: Internal Medicine | Admitting: Internal Medicine

## 2018-11-15 DIAGNOSIS — X16XXXA Contact with hot heating appliances, radiators and pipes, initial encounter: Secondary | ICD-10-CM | POA: Insufficient documentation

## 2018-11-15 DIAGNOSIS — E11621 Type 2 diabetes mellitus with foot ulcer: Secondary | ICD-10-CM | POA: Insufficient documentation

## 2018-11-15 DIAGNOSIS — S20111A Abrasion of breast, right breast, initial encounter: Secondary | ICD-10-CM | POA: Insufficient documentation

## 2018-11-15 DIAGNOSIS — L97521 Non-pressure chronic ulcer of other part of left foot limited to breakdown of skin: Secondary | ICD-10-CM | POA: Diagnosis not present

## 2018-11-15 DIAGNOSIS — T2121XA Burn of second degree of chest wall, initial encounter: Secondary | ICD-10-CM | POA: Insufficient documentation

## 2018-11-15 DIAGNOSIS — X58XXXA Exposure to other specified factors, initial encounter: Secondary | ICD-10-CM | POA: Insufficient documentation
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
## 2018-11-22 ENCOUNTER — Ambulatory Visit: Payer: Medicare Other | Admitting: Internal Medicine

## 2018-11-29 ENCOUNTER — Ambulatory Visit: Payer: Medicare Other | Admitting: Internal Medicine

## 2018-12-04 ENCOUNTER — Other Ambulatory Visit: Payer: Self-pay

## 2018-12-04 DIAGNOSIS — Z992 Dependence on renal dialysis: Secondary | ICD-10-CM

## 2018-12-04 DIAGNOSIS — N186 End stage renal disease: Secondary | ICD-10-CM

## 2018-12-05 ENCOUNTER — Other Ambulatory Visit: Payer: Self-pay | Admitting: *Deleted

## 2018-12-05 ENCOUNTER — Ambulatory Visit (HOSPITAL_COMMUNITY)
Admission: RE | Admit: 2018-12-05 | Discharge: 2018-12-05 | Disposition: A | Discharge status: Home / Self Care | Payer: Medicare Other | Source: Ambulatory Visit | Attending: Vascular Surgery | Admitting: Vascular Surgery

## 2018-12-05 ENCOUNTER — Ambulatory Visit (INDEPENDENT_AMBULATORY_CARE_PROVIDER_SITE_OTHER): Payer: Self-pay | Admitting: Physician Assistant

## 2018-12-05 ENCOUNTER — Encounter: Payer: Self-pay | Admitting: *Deleted

## 2018-12-05 ENCOUNTER — Other Ambulatory Visit: Payer: Self-pay

## 2018-12-05 VITALS — BP 125/89 | HR 104 | Temp 97.9°F | Resp 14 | Ht 66.0 in | Wt 165.2 lb

## 2018-12-05 DIAGNOSIS — Z992 Dependence on renal dialysis: Secondary | ICD-10-CM

## 2018-12-05 DIAGNOSIS — N186 End stage renal disease: Secondary | ICD-10-CM | POA: Insufficient documentation

## 2018-12-05 NOTE — Progress Notes (Signed)
Established Dialysis Access   History of Present Illness   GIULIANNA [name] is a 33 y.o. (1985/12/19) female who is status post left first stage basilic vein transposition by Dr. Carlis Abbott on 10/26/2018.  Patient states her incision is completely healed and she denies any signs or symptoms of steal syndrome in her left hand.  She is dialyzing via right [facility] on a Monday Wednesday Friday schedule without complication.  She is willing to proceed with second stage basilic vein transposition on a Tuesday or Thursday in the near future.  She does not take any blood thinners.  She does not have a pacemaker.  The patient's PMH, PSH, SH, and FamHx were reviewed and are unchanged from prior visit.  Current Outpatient Medications  Medication Sig Dispense Refill  . ACCU-CHEK SOFTCLIX LANCETS lancets Use to check blood sugar 4 times per day. 150 each 5  . acetaminophen (TYLENOL) 500 MG tablet Take 1,000 mg by mouth daily as needed for moderate pain.     Marland Kitchen amLODipine (NORVASC) 10 MG tablet Take 1 tablet (10 mg total) by mouth daily. 30 tablet 0  . b complex-vitamin c-folic acid (NEPHRO-VITE) 0.8 MG TABS tablet Take 1 tablet by mouth every Monday, Wednesday, and Friday. After dialysis on dialysis days    . cetirizine (ZYRTEC) 10 MG tablet Take 10 mg by mouth daily as needed for allergies.    Marland Kitchen dicyclomine (BENTYL) 20 MG tablet Take 1 tablet (20 mg total) by mouth 2 (two) times daily. 60 tablet 0  . famotidine (PEPCID) 40 MG tablet Take 40 mg by mouth daily.     . fluticasone (FLONASE) 50 MCG/ACT nasal spray Place 2 sprays into both nostrils daily as needed for allergies.    Marland Kitchen gabapentin (NEURONTIN) 300 MG capsule Take 300 mg by mouth 2 (two) times daily.     . hydrOXYzine (ATARAX/VISTARIL) 50 MG tablet Take 1 tablet (50 mg total) by mouth 2 (two) times daily as needed for itching. 30 tablet 0  . insulin aspart (NOVOLOG FLEXPEN) 100 UNIT/ML FlexPen Inject 12-15 Units into the skin See admin instructions. 5-6  times per day as needed for blood sugar management    . insulin degludec (TRESIBA) 100 UNIT/ML SOPN FlexTouch Pen Inject 0.6 mLs (60 Units total) into the skin 2 (two) times daily. Reports taking 24 units QAM (Patient taking differently: Inject 20 Units into the skin daily. ) 4 pen 3  . loperamide (IMODIUM) 2 MG capsule Take 2 mg by mouth as needed for diarrhea or loose stools.     . metoCLOPramide (REGLAN) 10 MG tablet Take 1 tablet (10 mg total) by mouth 3 (three) times daily before meals. 90 tablet 3  . pantoprazole (PROTONIX) 40 MG tablet Take 40 mg by mouth 2 (two) times daily.    . predniSONE (DELTASONE) 10 MG tablet Take 1 tablet (10 mg total) by mouth every morning. 30 tablet 3  . promethazine (PHENERGAN) 25 MG tablet Take 1 tablet (25 mg total) by mouth every 6 (six) hours as needed for nausea or vomiting. 30 tablet 0  . sevelamer carbonate (RENVELA) 0.8 g PACK packet Take 2 packets by mouth 3 (three) times daily with meals.    . simvastatin (ZOCOR) 20 MG tablet Take 20 mg by mouth at bedtime.      No current facility-administered medications for this visit.     On ROS today: 10 system ROS is negative unless otherwise noted in HPI   Physical Examination  Vitals:   12/05/18 1452  BP: 125/89  Pulse: (!) 104  Resp: 14  Temp: 97.9 F (36.6 C)  TempSrc: Temporal  SpO2: 100%  Weight: 165 lb 3.2 oz (74.9 kg)  Height: 5\' 6"  (1.676 m)   Body mass index is 26.66 kg/m.  General Alert, O x 3, WD, NAD  Pulmonary Sym exp, good B air movt, CTA B  Cardiac RRR, Nl S1, S2,  Vascular Vessel Right Left  Radial Palpable Palpable  Brachial Palpable Palpable  Ulnar Not palpable Not palpable    Musculo- skeletal M/S 5/5 throughout  , Extremities without ischemic changes; palpable left radial pulse; palpable thrill from fistula anastomosis to mid upper arm; grip strength intact left hand    Neurologic A&O; CN grossly intact     Non-invasive Vascular Imaging   left Arm Access Duplex   (12/05/18):   Diameters:  >6 mm  Depth:  8 mm  PSV:  200 c/s   Medical Decision Making   [name] is a 33 y.o. female who presents with ESRD requiring hemodialysis.    Patent left brachial basilic fistula without signs or symptoms of steal syndrome left hand  Based on fistula duplex today, the fistula is mature enough to proceed with second stage basilic vein transposition  This will be scheduled with Dr. Carlis Abbott on a Tuesday or Thursday in the near future Risk, benefits, and alternatives to access surgery were discussed.   The patient is aware the risks include but are not limited to: bleeding, infection, steal syndrome, nerve damage, thrombosis, failure to mature, and need for additional procedures.   The patient agrees to proceed with the procedure.   [name] PA-C [facility] Office: [phone]  Clinic MD: Dr. Carlis Abbott

## 2018-12-06 ENCOUNTER — Encounter: Payer: Medicare Other | Admitting: Internal Medicine

## 2018-12-06 DIAGNOSIS — E11621 Type 2 diabetes mellitus with foot ulcer: Secondary | ICD-10-CM | POA: Diagnosis not present
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
## 2018-12-07 NOTE — Progress Notes (Signed)
KAMRY, BLAKNEY ([id number]) Visit Report for 12/06/2018 Arrival Information Details Patient Name: JALEESE, STECKLER Date of Service: 12/06/2018 2:00 PM Medical Record Number: [id number] Patient Account Number: [id number] Date of Birth/Sex: Jan 16, 1986 (33 y.o. F) Treating RN: Army Melia Primary Care Zori Benbrook: [name] Other Clinician: Referring Treacy Holcomb: [name] Treating Blakeley Margraf/Extender: Tito Dine in Treatment: 4 Visit Information History Since Last Visit Added or deleted any medications: No Patient Arrived: Ambulatory Any new allergies or adverse reactions: No Arrival Time: 14:07 Had a fall or experienced change in No Accompanied By: self activities of daily living that may affect Transfer Assistance: None risk of falls: Patient Identification Verified: Yes Signs or symptoms of abuse/neglect since last visito No Has Dressing in Place as Prescribed: Yes Pain Present Now: No Electronic Signature(s) Signed: 12/06/2018 4:11:15 PM By: Army Melia Entered By: Army Melia on 12/06/2018 14:07:22 [name] ([id number]) -------------------------------------------------------------------------------- Encounter Discharge Information Details Patient Name: [name] Date of Service: 12/06/2018 2:00 PM Medical Record Number: [id number] Patient Account Number: [id number] Date of Birth/Sex: 1985-07-25 (33 y.o. F) Treating RN: Montey Hora Primary Care Lonnie Rosado: [name] Other Clinician: Referring Jadesola Poynter: [name] Treating Pearlee Arvizu/Extender: Tito Dine in Treatment: 4 Encounter Discharge Information Items Post Procedure Vitals Discharge Condition: Stable Temperature (F): 99.0 Ambulatory Status: Ambulatory Pulse (bpm): 108 Discharge Destination: Home Respiratory Rate (breaths/min): 18 Transportation: Private Auto Blood Pressure (mmHg): 112/73 Accompanied By: self Schedule Follow-up Appointment: Yes Clinical Summary of  Care: Electronic Signature(s) Signed: 12/06/2018 5:03:49 PM By: Montey Hora Entered By: Montey Hora on 12/06/2018 14:49:59 [name] ([id number]) -------------------------------------------------------------------------------- Lower Extremity Assessment Details Patient Name: [name] Date of Service: 12/06/2018 2:00 PM Medical Record Number: [id number] Patient Account Number: [id number] Date of Birth/Sex: 02/22/1986 (33 y.o. F) Treating RN: Army Melia Primary Care Oakleigh Hesketh: [name] Other Clinician: Referring Oaklyn Jakubek: [name] Treating Miria Cappelli/Extender: Ricard Dillon Weeks in Treatment: 4 Edema Assessment Assessed: [Left: No] [Right: No] Edema: [Left: N] [Right: o] Vascular Assessment Pulses: Dorsalis Pedis Palpable: [Left:Yes] Electronic Signature(s) Signed: 12/06/2018 4:11:15 PM By: Army Melia Entered By: Army Melia on 12/06/2018 14:18:49 [name] ([id number]) -------------------------------------------------------------------------------- Multi Wound Chart Details Patient Name: [name] Date of Service: 12/06/2018 2:00 PM Medical Record Number: [id number] Patient Account Number: [id number] Date of Birth/Sex: 1985-08-30 (33 y.o. 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 Lower Extremity Comorbid History: Anemia, Hypertension, Type II Anemia, Hypertension, Type II Anemia, Hypertension, Type  II Diabetes, Osteoarthritis, Diabetes, Osteoarthritis, Diabetes, Osteoarthritis, Neuropathy, Seizure Disorder Neuropathy, Seizure Disorder Neuropathy, Seizure Disorder Date Acquired: 10/17/2018 10/17/2018 10/17/2018 Weeks of Treatment: 4 4 4  Wound Status: Open Healed - Epithelialized Open Measurements L x W x D 1.7x0.5x0.1 0x0x0 7x4.5x0.1 (cm) Area (cm) : 0.668 0 24.74 Volume (cm) : 0.067 0 2.474 % Reduction in Area: 76.40% 100.00% 49.00% % Reduction in Volume: 76.30% 100.00% 49.00% Classification: Grade 1 Grade 1 Full Thickness Without Exposed Support Structures Exudate Amount: Medium None Present Medium Exudate Type: Serosanguineous N/A Serous Exudate Color: red, brown N/A amber Wound Margin: Flat and Intact Flat and Intact  Flat and Intact Granulation Amount: Medium (34-66%) None Present (0%) Small (1-33%) Granulation Quality: Red N/A Red Necrotic Amount: Medium (34-66%) Large (67-100%) Large (67-100%) Necrotic Tissue: Eschar, Adherent [place] Exposed Structures: Fat Layer (Subcutaneous Fascia: No Fat Layer (Subcutaneous Tissue) Exposed: Yes Fat Layer (Subcutaneous Tissue) Exposed: Yes Fascia: No Tissue) Exposed: No Fascia: No REIANNA, BERGSTRESSER ([id number]) Tendon: No Tendon: No Tendon: No Muscle: No Muscle: No Muscle: No Joint: No Joint: No Joint: No Bone: No Bone: No Bone: No Epithelialization: None Large (67-100%) None Debridement: N/A N/A Debridement - Excisional Pre-procedure N/A N/A 14:30 Verification/Time Out Taken: Pain Control: N/A N/A Lidocaine Tissue Debrided: N/A N/A Subcutaneous, Slough Level: N/A N/A Skin/Subcutaneous Tissue Debridement Area (sq cm): N/A N/A 31.5 Instrument: N/A N/A Blade, Forceps Bleeding: N/A N/A Minimum Hemostasis Achieved: N/A N/A Pressure Debridement Treatment N/A N/A Procedure was tolerated well Response: Post Debridement N/A N/A 7x4.5x0.1 Measurements L x W x D (cm) Post Debridement Volume: N/A N/A  2.474 (cm) Procedures Performed: N/A N/A Debridement Wound Number: 9 N/A N/A Photos: N/A N/A Wound Location: Left Toe Great - Medial N/A N/A Wounding Event: Not Known N/A N/A Primary Etiology: Diabetic Wound/Ulcer of the N/A N/A Lower Extremity Comorbid History: Anemia, Hypertension, Type II N/A N/A Diabetes, Osteoarthritis, Neuropathy, Seizure Disorder Date Acquired: 10/17/2018 N/A N/A Weeks of Treatment: 4 N/A N/A Wound Status: Open N/A N/A Measurements L x W x D 2.5x1.5x0.2 N/A N/A (cm) Area (cm) : 2.945 N/A N/A Volume (cm) : 0.589 N/A N/A % Reduction in Area: -4.20% N/A N/A % Reduction in Volume: -4.20% N/A N/A Classification: Grade 1 N/A N/A Exudate Amount: Medium N/A N/A Exudate Type: Serosanguineous N/A N/A Exudate Color: red, brown N/A N/A Wound Margin: Flat and Intact N/A N/A Granulation Amount: Small (1-33%) N/A N/A Granulation Quality: Red N/A N/A Necrotic Amount: Large (67-100%) N/A N/A MAKIKO, TSUTSUI ([id number]) Necrotic Tissue: Eschar, Adherent Slough N/A N/A Exposed Structures: Fat Layer (Subcutaneous N/A N/A Tissue) Exposed: Yes Fascia: No Tendon: No Muscle: No Joint: No Bone: No Epithelialization: None N/A N/A Debridement: N/A N/A N/A Pain Control: N/A N/A N/A Tissue Debrided: N/A N/A N/A Level: N/A N/A N/A Debridement Area (sq cm): N/A N/A N/A Instrument: N/A N/A N/A Bleeding: N/A N/A N/A Hemostasis Achieved: N/A N/A N/A Debridement Treatment N/A N/A N/A Response: Post Debridement N/A N/A N/A Measurements L x W x D (cm) Post Debridement Volume: N/A N/A N/A (cm) Procedures Performed: N/A N/A N/A Treatment Notes Electronic Signature(s) Signed: 12/06/2018 5:27:47 PM By: Linton Ham MD Entered By: Linton Ham on 12/06/2018 14:35:57 [name] ([id number]) -------------------------------------------------------------------------------- Multi-Disciplinary Care Plan Details Patient Name: [name] Date of Service: 12/06/2018  2:00 PM Medical Record Number: [id number] Patient Account Number: [id number] Date of Birth/Sex: [birth year] (33 y.o. F) Treating RN: Cornell Barman Primary Care Dwight Adamczak: [name] Other Clinician: Referring Altheia Shafran: [name] Treating Julian Askin/Extender: Tito Dine in Treatment: 4 Active Inactive Necrotic Tissue Nursing Diagnoses: Impaired tissue integrity related to necrotic/devitalized tissue Goals: Necrotic/devitalized tissue will be minimized in the wound bed Date Initiated: 12/06/2018 Target Resolution Date: 12/13/2018 Goal Status: Active Patient/caregiver will verbalize understanding of reason and process for debridement of necrotic tissue Date Initiated: 12/06/2018 Target Resolution Date: 12/13/2018 Goal Status: Active Interventions: Assess patient pain level pre-, during and post procedure and prior to discharge Notes: Wound/Skin Impairment Nursing Diagnoses: Impaired tissue integrity Knowledge deficit related to smoking impact on wound healing Goals: Ulcer/skin breakdown will have a volume reduction of 30% by week  4 Date Initiated: 11/08/2018 Target Resolution Date: 12/08/2018 Goal Status: Active Interventions: Assess patient/caregiver ability to obtain necessary supplies Assess patient/caregiver ability to perform ulcer/skin care regimen upon admission and as needed Assess ulceration(s) every visit Notes: Electronic Signature(s) Signed: 12/06/2018 5:53:54 PM By: Gretta Cool, BSN, RN, CWS, Kim RN, BSN Entered By: Gretta Cool, BSN, RN, CWS, Kim on 12/06/2018 14:31:22 [name] ([id number]) -------------------------------------------------------------------------------- Pain Assessment Details Patient Name: [name] Date of Service: 12/06/2018 2:00 PM Medical Record Number: [id number] Patient Account Number: [id number] Date of Birth/Sex: 1985-10-25 (33 y.o. F) Treating RN: Army Melia Primary Care Cathern Tahir: [name] Other Clinician: Referring  Collin Hendley: [name] Treating Charma Mocarski/Extender: Tito Dine in Treatment: 4 Active Problems Location of Pain Severity and Description of Pain Patient Has Paino No Site Locations Pain Management and Medication Current Pain Management: Electronic Signature(s) Signed: 12/06/2018 4:11:15 PM By: Army Melia Entered By: Army Melia on 12/06/2018 14:07:28 [name] ([id number]) -------------------------------------------------------------------------------- Patient/Caregiver Education Details Patient Name: [name] Date of Service: 12/06/2018 2:00 PM Medical Record Number: [id number] Patient Account Number: [id number] Date of Birth/Gender: [birth year] (33 y.o. F) Treating RN: Cornell Barman Primary Care Physician: [name] Other Clinician: Referring Physician: [name] Treating Physician/Extender: Tito Dine in Treatment: 4 Education Assessment Education Provided To: Patient Education Topics Provided Wound Debridement: Handouts: Wound Debridement Methods: Demonstration, Explain/Verbal Responses: State content correctly Wound/Skin Impairment: Handouts: Caring for Your Ulcer Methods: Demonstration, Explain/Verbal Responses: State content correctly Electronic Signature(s) Signed: 12/06/2018 5:53:54 PM By: Gretta Cool, BSN, RN, CWS, Kim RN, BSN Entered By: Gretta Cool, BSN, RN, CWS, Kim on 12/06/2018 14:36:41 [name] ([id number]) -------------------------------------------------------------------------------- Wound Assessment Details Patient Name: [name] Date of Service: 12/06/2018 2:00 PM Medical Record Number: [id number] Patient Account Number: [id number] Date of Birth/Sex: [birth year] (33 y.o. F) Treating RN: Army Melia Primary Care Oluwaseun Bruyere: [name] Other Clinician: Referring Mattix Imhof: [name] Treating Ladislaus Repsher/Extender: Tito Dine in Treatment: 4 Wound Status Wound Number: 10 Primary Diabetic  Wound/Ulcer of the Lower Extremity Etiology: Wound Location: Left Toe Second Wound Open Wounding Event: Not Known Status: Date Acquired: 10/17/2018 Comorbid Anemia, Hypertension, Type II Diabetes, Weeks Of Treatment: 4 History: Osteoarthritis, Neuropathy, Seizure Disorder Clustered Wound: No Photos Wound Measurements Length: (cm) 1.7 Width: (cm) 0.5 Depth: (cm) 0.1 Area: (cm) 0.668 Volume: (cm) 0.067 % Reduction in Area: 76.4% % Reduction in Volume: 76.3% Epithelialization: None Tunneling: No Undermining: No Wound Description Classification: Grade 2 Foul Odor Wound Margin: Flat and Intact Slough/Fib Exudate Amount: Large Exudate Type: Serous Exudate Color: amber After Cleansing: No rino Yes Wound Bed Granulation Amount: Medium (34-66%) Exposed Structure Granulation Quality: Red Fascia Exposed: No Necrotic Amount: Medium (34-66%) Fat Layer (Subcutaneous Tissue) Exposed: Yes Necrotic Quality: Eschar, Adherent Slough Tendon Exposed: No Muscle Exposed: No Joint Exposed: No Bone Exposed: No Treatment Notes VICTORIALYNN, SANKARAN ([id number]) Wound #10 (Left Toe Second) Notes silvercel, gauze, conform and tape Electronic Signature(s) Signed: 12/06/2018 5:12:58 PM By: Gretta Cool, BSN, RN, CWS, Kim RN, BSN Signed: 12/07/2018 9:37:53 AM By: Army Melia Previous Signature: 12/06/2018 4:11:15 PM Version By: Army Melia Entered By: Gretta Cool BSN, RN, CWS, Kim on 12/06/2018 17:12:58 [name] ([id number]) -------------------------------------------------------------------------------- Wound Assessment Details Patient Name: [name] Date of Service: 12/06/2018 2:00 PM Medical Record Number: [id number] Patient Account Number: [id number] Date of Birth/Sex: [birth year] (33 y.o. F) Treating RN: Cornell Barman Primary Care Srishti Strnad: [name] Other Clinician: Referring Virgina Deakins: [name] Treating Chet Greenley/Extender: Ricard Dillon Weeks in Treatment: 4 Wound Status  Wound  Number: 11 Primary Diabetic Wound/Ulcer of the Lower Extremity Etiology: Wound Location: Left Toe Third Wound Healed - Epithelialized Wounding Event: Not Known Status: Date Acquired: 10/17/2018 Comorbid Anemia, Hypertension, Type II Diabetes, Weeks Of Treatment: 4 History: Osteoarthritis, Neuropathy, Seizure Disorder Clustered Wound: No Photos Wound Measurements Length: (cm) 0 % Redu Width: (cm) 0 % Redu Depth: (cm) 0 Epithe Area: (cm) 0 [place] Volume: (cm) 0 Under ction in Area: 100% ction in Volume: 100% lialization: Large (67-100%) ling: No mining: No Wound Description Classification: Grade 1 Wound Margin: Flat and Intact Exudate Amount: None Present Foul Odor After Cleansing: No Slough/Fibrino Yes Wound Bed Granulation Amount: None Present (0%) Exposed Structure Necrotic Amount: Large (67-100%) Fascia Exposed: No Necrotic Quality: Eschar Fat Layer (Subcutaneous Tissue) Exposed: No Tendon Exposed: No Muscle Exposed: No Joint Exposed: No Bone Exposed: No Electronic Signature(s) Signed: 12/06/2018 5:53:54 PM By: Gretta Cool, BSN, RN, CWS, Kim RN, BSN Entered By: Gretta Cool, BSN, RN, CWS, Kim on 12/06/2018 14:27:10 [name] ([id number]) [name] ([id number]) -------------------------------------------------------------------------------- Wound Assessment Details Patient Name: [name] Date of Service: 12/06/2018 2:00 PM Medical Record Number: [id number] Patient Account Number: [id number] Date of Birth/Sex: [birth year] (33 y.o. F) Treating RN: Army Melia Primary Care Thressa Shiffer: [name] Other Clinician: Referring Dontray Haberland: [name] Treating Santoria Chason/Extender: Tito Dine in Treatment: 4 Wound Status Wound Number: 8 Primary 2nd degree Burn Etiology: Wound Location: Right Breast Wound Open Wounding Event: Thermal Burn Status: Date Acquired: 10/17/2018 Comorbid Anemia, Hypertension, Type II Diabetes, Weeks Of Treatment: 4 History:  Osteoarthritis, Neuropathy, Seizure Disorder Clustered Wound: No Photos Wound Measurements Length: (cm) 7 Width: (cm) 4.5 Depth: (cm) 0.1 Area: (cm) 24.74 Volume: (cm) 2.474 % Reduction in Area: 49% % Reduction in Volume: 49% Epithelialization: None Tunneling: No Undermining: No Wound Description Full Thickness Without Exposed Support Classification: Structures Wound Margin: Flat and Intact Exudate Medium Amount: Exudate Type: Serous Exudate Color: amber Foul Odor After Cleansing: No Slough/Fibrino Yes Wound Bed Granulation Amount: Small (1-33%) Exposed Structure Granulation Quality: Red Fascia Exposed: No Necrotic Amount: Large (67-100%) Fat Layer (Subcutaneous Tissue) Exposed: Yes Necrotic Quality: Adherent Slough Tendon Exposed: No Muscle Exposed: No Joint Exposed: No Bone Exposed: No CHENEY, PATMAN ([id number]) Treatment Notes Wound #8 (Right Breast) Notes Breast- santyl, gauze, abd and tape Electronic Signature(s) Signed: 12/06/2018 4:11:15 PM By: Army Melia Signed: 12/06/2018 5:53:54 PM By: Gretta Cool, BSN, RN, CWS, Kim RN, BSN Entered By: Gretta Cool, BSN, RN, CWS, Kim on 12/06/2018 14:38:05 [name] ([id number]) -------------------------------------------------------------------------------- Wound Assessment Details Patient Name: [name] Date of Service: 12/06/2018 2:00 PM Medical Record Number: [id number] Patient Account Number: [id number] Date of Birth/Sex: 03/04/1986 (33 y.o. F) Treating RN: Army Melia Primary Care Keili Hasten: [name] Other Clinician: Referring Kadey Mihalic: [name] Treating Cole Eastridge/Extender: Tito Dine in Treatment: 4 Wound Status Wound Number: 9 Primary Diabetic Wound/Ulcer of the Lower Extremity Etiology: Wound Location: Left Toe Great - Medial Wound Open Wounding Event: Not Known Status: Date Acquired: 10/17/2018 Comorbid Anemia, Hypertension, Type II Diabetes, Weeks Of Treatment: 4 History:  Osteoarthritis, Neuropathy, Seizure Disorder Clustered Wound: No Photos Wound Measurements Length: (cm) 2.5 Width: (cm) 1.5 Depth: (cm) 0.2 Area: (cm) 2.945 Volume: (cm) 0.589 % Reduction in Area: -4.2% % Reduction in Volume: -4.2% Epithelialization: None Tunneling: No Undermining: No Wound Description Classification: Grade 2 Foul Odor Wound Margin: Flat and Intact Slough/Fi Exudate Amount: Medium Exudate Type: Serosanguineous Exudate Color: red, brown After Cleansing: No brino Yes Wound Bed Granulation Amount: Small (  1-33%) Exposed Structure Granulation Quality: Red Fascia Exposed: No Necrotic Amount: Large (67-100%) Fat Layer (Subcutaneous Tissue) Exposed: Yes Necrotic Quality: Eschar, Adherent Slough Tendon Exposed: No Muscle Exposed: No Joint Exposed: No Bone Exposed: No Treatment Notes INETHA, DEBEER ([id number]) Wound #9 (Left, Medial Toe Great) Notes silvercel, gauze, conform and tape Electronic Signature(s) Signed: 12/06/2018 5:13:21 PM By: Gretta Cool, BSN, RN, CWS, Kim RN, BSN Signed: 12/07/2018 9:37:53 AM By: Army Melia Previous Signature: 12/06/2018 4:11:15 PM Version By: Army Melia Entered By: Gretta Cool BSN, RN, CWS, Kim on 12/06/2018 17:13:21 [name] ([id number]) -------------------------------------------------------------------------------- [place] Details Patient Name: [name] Date of Service: 12/06/2018 2:00 PM Medical Record Number: [id number] Patient Account Number: [id number] Date of Birth/Sex: 1986-03-02 (33 y.o. F) Treating RN: Army Melia Primary Care Shon Mansouri: [name] Other Clinician: Referring Chidiebere Wynn: [name] Treating Jaylene Arrowood/Extender: Tito Dine in Treatment: 4 Vital Signs Time Taken: 14:07 Temperature (F): 99.0 Height (in): 66 Pulse (bpm): 108 Weight (lbs): 175 Respiratory Rate (breaths/min): 16 Body Mass Index (BMI): 28.2 Blood Pressure (mmHg): 123/73 Reference Range: 80 - 120 mg /  dl Electronic Signature(s) Signed: 12/06/2018 4:11:15 PM By: Army Melia Entered By: Army Melia on 12/06/2018 14:08:27

## 2018-12-13 ENCOUNTER — Other Ambulatory Visit: Payer: Self-pay

## 2018-12-13 ENCOUNTER — Encounter: Payer: Medicare Other | Admitting: Internal Medicine

## 2018-12-13 DIAGNOSIS — E11621 Type 2 diabetes mellitus with foot ulcer: Secondary | ICD-10-CM | POA: Diagnosis not present

## 2018-12-13 NOTE — Progress Notes (Signed)
AWANDA, MOLITORIS ([id number]) Visit Report for 12/13/2018 Arrival Information Details Patient Name: [name], [name] Date of Service: 12/13/2018 2:30 PM Medical Record Number: [id number] Patient Account Number: [id number] Date of Birth/Sex: August 17, 1985 (33 y.o. F) Treating RN: Army Melia Primary Care Neftali Abair: [name] Other Clinician: Referring Tomothy Eddins: [name] Treating Jabron Weese/Extender: Tito Dine in Treatment: 5 Visit Information History Since Last Visit Added or deleted any medications: No Patient Arrived: Ambulatory Any new allergies or adverse reactions: No Arrival Time: 14:35 Had a fall or experienced change in No Accompanied By: self activities of daily living that may affect Transfer Assistance: None risk of falls: Patient Identification Verified: Yes Signs or symptoms of abuse/neglect since last visito No Hospitalized since last visit: No Has Dressing in Place as Prescribed: Yes Pain Present Now: No Electronic Signature(s) Signed: 12/13/2018 4:21:29 PM By: Army Melia Entered By: Army Melia on 12/13/2018 14:35:56 [name] ([id number]) -------------------------------------------------------------------------------- Encounter Discharge Information Details Patient Name: [name] Date of Service: 12/13/2018 2:30 PM Medical Record Number: [id number] Patient Account Number: [id number] Date of Birth/Sex: 02-28-1986 (33 y.o. F) Treating RN: Cornell Barman Primary Care Leomar Westberg: [name] Other Clinician: Referring Shalise Rosado: [name] Treating Yamili Lichtenwalner/Extender: Tito Dine in Treatment: 5 Encounter Discharge Information Items Post Procedure Vitals Discharge Condition: Stable Temperature (F): 99.2 Ambulatory Status: Ambulatory Pulse (bpm): 108 Discharge Destination: Home Respiratory Rate (breaths/min): 16 Transportation: Private Auto Blood Pressure (mmHg): 85/62 Accompanied By: self Schedule Follow-up  Appointment: Yes Clinical Summary of Care: Electronic Signature(s) Signed: 12/13/2018 5:01:16 PM By: Gretta Cool, BSN, RN, CWS, Kim RN, BSN Entered By: Gretta Cool, BSN, RN, CWS, Kim on 12/13/2018 15:14:46 [name] ([id number]) -------------------------------------------------------------------------------- Lower Extremity Assessment Details Patient Name: [name] Date of Service: 12/13/2018 2:30 PM Medical Record Number: [id number] Patient Account Number: [id number] Date of Birth/Sex: November 04, 1985 (33 y.o. F) Treating RN: Army Melia Primary Care Eason Housman: [name] Other Clinician: Referring Saivion Goettel: [name] Treating Fransisco Messmer/Extender: Ricard Dillon Weeks in Treatment: 5 Edema Assessment Assessed: [Left: No] [Right: No] Edema: [Left: N] [Right: o] Vascular Assessment Pulses: Dorsalis Pedis Palpable: [Left:Yes] Electronic Signature(s) Signed: 12/13/2018 4:21:29 PM By: Army Melia Entered By: Army Melia on 12/13/2018 14:43:50 [name] ([id number]) -------------------------------------------------------------------------------- Multi Wound Chart Details Patient Name: [name] Date of Service: 12/13/2018 2:30 PM Medical Record Number: [id number] Patient Account Number: [id number] Date of Birth/Sex: 1985-11-16 (33 y.o. 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 Lower Extremity Comorbid History: Anemia,  Hypertension, Type II Anemia, Hypertension, Type II Anemia, Hypertension, Type II Diabetes, Osteoarthritis, Diabetes, Osteoarthritis, Diabetes, Osteoarthritis, Neuropathy, Seizure Disorder Neuropathy, Seizure Disorder Neuropathy, Seizure Disorder Date Acquired: 10/17/2018 10/17/2018 10/17/2018 Weeks of Treatment: 5 5 5  Wound Status: Healed - Epithelialized Open Open Measurements L x W x D 0x0x0 5.3x3.5x0.3 2x1.7x0.2 (cm) Area (cm) : 0 14.569 2.67 Volume (cm) : 0 4.371 0.534 % Reduction in Area: 100.00% 70.00% 5.60% % Reduction in Volume: 100.00% 9.90% 5.50% Classification: Grade 2 Full Thickness Without Grade 2 Exposed Support Structures Exudate Amount: Large Medium Medium Exudate Type: Serous Serous Serosanguineous  Exudate Color: amber amber red, brown Wound Margin: Flat and Intact Flat and Intact Flat and Intact Granulation Amount: Small (1-33%) Large (67-100%) Small (1-33%) Granulation Quality: Red Red Red Necrotic Amount: Medium (34-66%) Small (1-33%) Large (67-100%) Necrotic Tissue: Eschar, Adherent [place] Exposed Structures: Fat Layer (Subcutaneous Fat Layer (Subcutaneous Fat Layer (Subcutaneous Tissue) Exposed: Yes Tissue) Exposed: Yes Tissue) Exposed: Yes Fascia: No Fascia: No Fascia: No MARISELLA, SAGGIO ([id number]) Tendon: No Tendon: No Tendon: No Muscle: No Muscle: No Muscle: No Joint: No Joint: No Joint: No Bone: No Bone: No Bone: No Epithelialization: Medium (34-66%) None None Debridement: N/A Chemical/Enzymatic/Mechanical N/A Pre-procedure N/A 15:11 N/A Verification/Time Out Taken: Pain Control: N/A Lidocaine N/A Instrument: N/A Other(tongue blade) N/A Bleeding: N/A None N/A Debridement Treatment N/A Procedure was tolerated well N/A Response: Post Debridement N/A 5.3x3.5x0.3 N/A Measurements L x W x D (cm) Post Debridement Volume: N/A 4.371 N/A (cm) Procedures Performed: N/A Debridement N/A Treatment  Notes Wound #8 (Right Breast) Notes Breast- santyl, gauze, abd and tape; s cell toes Wound #9 (Left, Medial Toe Great) Notes Breast- santyl, gauze, abd and tape; s cell toes Electronic Signature(s) Signed: 12/13/2018 4:51:07 PM By: Linton Ham MD Entered By: Linton Ham on 12/13/2018 16:19:44 [name] ([id number]) -------------------------------------------------------------------------------- Multi-Disciplinary Care Plan Details Patient Name: [name] Date of Service: 12/13/2018 2:30 PM Medical Record Number: [id number] Patient Account Number: [id number] Date of Birth/Sex: 1985/05/08 (33 y.o. F) Treating RN: Cornell Barman Primary Care Jasalyn Frysinger: [name] Other Clinician: Referring Vinh Sachs: [name] Treating Marija Calamari/Extender: Tito Dine in Treatment: 5 Active Inactive Necrotic Tissue Nursing Diagnoses: Impaired tissue integrity related to necrotic/devitalized tissue Goals: Necrotic/devitalized tissue will be minimized in the wound bed Date Initiated: 12/06/2018 Target Resolution Date: 12/13/2018 Goal Status: Active Patient/caregiver will verbalize understanding of reason and process for debridement of necrotic tissue Date Initiated: 12/06/2018 Target Resolution Date: 12/13/2018 Goal Status: Active Interventions: Assess patient pain level pre-, during and post procedure and prior to discharge Notes: Wound/Skin Impairment Nursing Diagnoses: Impaired tissue integrity Knowledge deficit related to smoking impact on wound healing Goals: Ulcer/skin breakdown will have a volume reduction of 30% by week 4 Date Initiated: 11/08/2018 Target Resolution Date: 12/08/2018 Goal Status: Active Interventions: Assess patient/caregiver ability to obtain necessary supplies Assess patient/caregiver ability to perform ulcer/skin care regimen upon admission and as needed Assess ulceration(s) every visit Notes: Electronic Signature(s) Signed: 12/13/2018  5:01:16 PM By: Gretta Cool, BSN, RN, CWS, Kim RN, BSN Entered By: Gretta Cool, BSN, RN, CWS, Kim on 12/13/2018 15:09:09 [name] ([id number]) -------------------------------------------------------------------------------- Pain Assessment Details Patient Name: [name] Date of Service: 12/13/2018 2:30 PM Medical Record Number: [id number] Patient Account Number: [id number] Date of Birth/Sex: October 07, 1985 (33 y.o. F) Treating RN: Army Melia Primary Care Sherylann Vangorden: [name] Other Clinician: Referring Mckenlee Mangham: [name] Treating Collier Bohnet/Extender: Tito Dine in Treatment: 5 Active Problems Location of Pain Severity and Description of Pain Patient Has Paino No Site Locations Pain Management and Medication Current Pain Management: Electronic Signature(s) Signed: 12/13/2018 4:21:29 PM By: Army Melia Entered By: Army Melia on 12/13/2018 14:36:04 [name] ([id number]) -------------------------------------------------------------------------------- Patient/Caregiver Education Details Patient Name: [name] Date of Service: 12/13/2018 2:30 PM Medical Record Number: [id number] Patient Account Number: [id number] Date of Birth/Gender: 1985-04-16 (33 y.o. F) Treating RN: Cornell Barman Primary Care Physician: [name] Other Clinician: Referring Physician: [name] Treating Physician/Extender: Tito Dine in Treatment: 5 Education Assessment Education Provided To: Patient Education Topics Provided Wound/Skin  Impairment: Handouts: Caring for Your Ulcer Methods: Demonstration, Explain/Verbal Responses: State content correctly Electronic Signature(s) Signed: 12/13/2018 5:01:16 PM By: Gretta Cool, BSN, RN, CWS, Kim RN, BSN Entered By: Gretta Cool, BSN, RN, CWS, Kim on 12/13/2018 15:13:37 [name] ([id number]) -------------------------------------------------------------------------------- Wound Assessment Details Patient Name: [name] Date of Service: 12/13/2018 2:30 PM Medical Record Number: [id number] Patient Account Number: [id number] Date of Birth/Sex: Oct 07, 1985 (33 y.o. F) Treating RN: Cornell Barman Primary Care Kayelynn Abdou: [name] Other Clinician: Referring Keenen Roessner: [name] Treating Tenzin Edelman/Extender: Tito Dine in Treatment: 5 Wound Status Wound Number: 10 Primary Diabetic Wound/Ulcer of the Lower Extremity Etiology: Wound Location: Left Toe Second Wound Healed - Epithelialized Wounding Event: Not Known Status: Date Acquired: 10/17/2018 Comorbid Anemia, Hypertension, Type II Diabetes, Weeks Of Treatment: 5 History: Osteoarthritis, Neuropathy, Seizure Disorder Clustered Wound: No Photos Wound Measurements Length: (cm) 0 % Reduc Width: (cm) 0 % Reduc Depth: (cm) 0 Epithel Area: (cm) 0 Tunnel Volume: (cm) 0 Underm tion in Area: 100% tion in Volume: 100% ialization: Medium (34-66%) ing: No ining: No Wound Description Classification: Grade 2 Wound Margin: Flat and Intact Exudate Amount: Large Exudate Type: Serous Exudate Color: amber Foul Odor After Cleansing: No Slough/Fibrino Yes Wound Bed Granulation Amount: Small (1-33%) Exposed Structure Granulation Quality: Red Fascia Exposed: No Necrotic Amount: Medium (34-66%) Fat Layer (Subcutaneous Tissue) Exposed: Yes Necrotic Quality: Eschar, Adherent Slough Tendon Exposed: No Muscle Exposed: No Joint Exposed: No Bone Exposed: No Electronic Signature(s) CHAVELY, GARITY ([id number]) Signed: 12/13/2018 5:01:16 PM By: Gretta Cool, BSN, RN, CWS, Kim RN, BSN Entered By: Gretta Cool, BSN, RN, CWS, Kim on 12/13/2018 15:11:21 [name] ([id number]) -------------------------------------------------------------------------------- Wound Assessment Details Patient Name: [name] Date of Service: 12/13/2018 2:30 PM Medical Record Number: [id number] Patient Account Number: [id number] Date of Birth/Sex: [birth year] (33 y.o.  F) Treating RN: Army Melia Primary Care Mahdiya Mossberg: [name] Other Clinician: Referring Shaguana Love: [name] Treating Talulah Schirmer/Extender: Ricard Dillon Weeks in Treatment: 5 Wound Status Wound Number: 8 Primary 2nd degree Burn Etiology: Wound Location: Right Breast Wound Open Wounding Event: Thermal Burn Status: Date Acquired: 10/17/2018 Comorbid Anemia, Hypertension, Type II Diabetes, Weeks Of Treatment: 5 History: Osteoarthritis, Neuropathy, Seizure Disorder Clustered Wound: No Photos Wound Measurements Length: (cm) 5.3 Width: (cm) 3.5 Depth: (cm) 0.3 Area: (cm) 14.569 Volume: (cm) 4.371 % Reduction in Area: 70% % Reduction in Volume: 9.9% Epithelialization: None Tunneling: No Undermining: No Wound Description Full Thickness Without Exposed Support Classification: Structures Wound Margin: Flat and Intact Exudate Medium Amount: Exudate Type: Serous Exudate Color: amber Foul Odor After Cleansing: No Slough/Fibrino Yes Wound Bed Granulation Amount: Large (67-100%) Exposed Structure Granulation Quality: Red Fascia Exposed: No Necrotic Amount: Small (1-33%) Fat Layer (Subcutaneous Tissue) Exposed: Yes Necrotic Quality: Adherent Slough Tendon Exposed: No Muscle Exposed: No Joint Exposed: No Bone Exposed: No ANNALIZ, SAVARY ([id number]) Treatment Notes Wound #8 (Right Breast) Notes Breast- santyl, gauze, abd and tape; s cell toes Electronic Signature(s) Signed: 12/13/2018 4:21:29 PM By: Army Melia Entered By: Army Melia on 12/13/2018 14:42:59 [name] ([id number]) -------------------------------------------------------------------------------- Wound Assessment Details Patient Name: [name] Date of Service: 12/13/2018 2:30 PM Medical Record Number: [id number] Patient Account Number: [id number] Date of Birth/Sex: October 21, 1985 (33 y.o. F) Treating RN: Army Melia Primary Care Maleiah Dula: [name] Other Clinician: Referring  Lucciano Vitali: [name] Treating Kamisha Ell/Extender: Ricard Dillon Weeks in Treatment: 5 Wound Status Wound Number: 9 Primary Diabetic Wound/Ulcer of the Lower Extremity Etiology: Wound Location: Left Toe Great - Medial Wound  Open Wounding Event: Not Known Status: Date Acquired: 10/17/2018 Comorbid Anemia, Hypertension, Type II Diabetes, Weeks Of Treatment: 5 History: Osteoarthritis, Neuropathy, Seizure Disorder Clustered Wound: No Photos Wound Measurements Length: (cm) 2 Width: (cm) 1.7 Depth: (cm) 0.2 Area: (cm) 2.67 Volume: (cm) 0.534 % Reduction in Area: 5.6% % Reduction in Volume: 5.5% Epithelialization: None Tunneling: No Undermining: No Wound Description Classification: Grade 2 Foul Odor Wound Margin: Flat and Intact Slough/Fi Exudate Amount: Medium Exudate Type: Serosanguineous Exudate Color: red, brown After Cleansing: No brino Yes Wound Bed Granulation Amount: Small (1-33%) Exposed Structure Granulation Quality: Red Fascia Exposed: No Necrotic Amount: Large (67-100%) Fat Layer (Subcutaneous Tissue) Exposed: Yes Necrotic Quality: Eschar, Adherent Slough Tendon Exposed: No Muscle Exposed: No Joint Exposed: No Bone Exposed: No Treatment Notes BETTELOU, SLOOP ([id number]) Wound #9 (Left, Medial Toe Great) Notes Breast- santyl, gauze, abd and tape; s cell toes Electronic Signature(s) Signed: 12/13/2018 4:21:29 PM By: Army Melia Entered By: Army Melia on 12/13/2018 14:43:30 [name] ([id number]) -------------------------------------------------------------------------------- Vitals Details Patient Name: [name] Date of Service: 12/13/2018 2:30 PM Medical Record Number: [id number] Patient Account Number: [id number] Date of Birth/Sex: 04-28-1985 (33 y.o. F) Treating RN: Army Melia Primary Care Margaretmary Prisk: [name] Other Clinician: Referring Timmy Bubeck: [name] Treating Sahily Biddle/Extender: Tito Dine in  Treatment: 5 Vital Signs Time Taken: 14:36 Temperature (F): 99.2 Height (in): 66 Pulse (bpm): 108 Weight (lbs): 175 Respiratory Rate (breaths/min): 16 Body Mass Index (BMI): 28.2 Blood Pressure (mmHg): 85/62 Reference Range: 80 - 120 mg / dl Electronic Signature(s) Signed: 12/13/2018 4:21:29 PM By: Army Melia Entered By: Army Melia on 12/13/2018 14:36:35
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
## 2018-12-19 ENCOUNTER — Encounter (HOSPITAL_COMMUNITY): Payer: Self-pay | Admitting: *Deleted

## 2018-12-19 ENCOUNTER — Other Ambulatory Visit (HOSPITAL_COMMUNITY)
Admission: RE | Admit: 2018-12-19 | Discharge: 2018-12-19 | Disposition: A | Discharge status: Home / Self Care | Payer: Medicare Other | Source: Ambulatory Visit | Attending: Vascular Surgery | Admitting: Vascular Surgery

## 2018-12-19 ENCOUNTER — Other Ambulatory Visit: Payer: Self-pay

## 2018-12-19 DIAGNOSIS — Z01812 Encounter for preprocedural laboratory examination: Secondary | ICD-10-CM | POA: Insufficient documentation

## 2018-12-19 DIAGNOSIS — Z20828 Contact with and (suspected) exposure to other viral communicable diseases: Secondary | ICD-10-CM | POA: Insufficient documentation

## 2018-12-19 LAB — SARS CORONAVIRUS 2 (TAT 6-24 HRS): SARS Coronavirus 2: NEGATIVE

## 2018-12-19 NOTE — Progress Notes (Signed)
Pt denies SOB, chest pain, and being under the care of a cardiologist. Pt stated that she is under the care of Dr. [name], PCP. Pt denies having a stress test and cardiac cath. Pt made aware to stop taking vitamins, fish oil and herbal medications. Do not take any NSAIDs ie: Ibuprofen, Advil, Naproxen (Aleve), Motrin, BC and Goody Powder. Pt made aware to take 12 units of Tresiba insulin DOS if CBG is > 70. Pt made aware to check CBG every 2 hours prior to arrival to hospital on DOS. Pt made aware to not take insulin if CBG is < 70 but  Instead, treat a CBG < 70 with  4 ounces of apple  juice, wait 15 minutes after intervention to recheck BG, if BG remains < 70, call Short Stay unit to speak with a nurse. Pt made aware to take 1/2 correction dose of Novolog insulin for a CBG > 220. Pt verbalized understanding of all pre-op instructions.

## 2018-12-20 ENCOUNTER — Encounter: Payer: Medicare Other | Attending: Internal Medicine | Admitting: Internal Medicine

## 2018-12-20 DIAGNOSIS — L97522 Non-pressure chronic ulcer of other part of left foot with fat layer exposed: Secondary | ICD-10-CM | POA: Insufficient documentation

## 2018-12-20 DIAGNOSIS — Z6828 Body mass index (BMI) 28.0-28.9, adult: Secondary | ICD-10-CM | POA: Insufficient documentation

## 2018-12-20 DIAGNOSIS — Z992 Dependence on renal dialysis: Secondary | ICD-10-CM | POA: Insufficient documentation

## 2018-12-20 DIAGNOSIS — T2121XA Burn of second degree of chest wall, initial encounter: Secondary | ICD-10-CM | POA: Insufficient documentation

## 2018-12-20 DIAGNOSIS — N186 End stage renal disease: Secondary | ICD-10-CM | POA: Insufficient documentation

## 2018-12-20 DIAGNOSIS — X158XXA Contact with other hot household appliances, initial encounter: Secondary | ICD-10-CM | POA: Insufficient documentation

## 2018-12-20 DIAGNOSIS — E119 Type 2 diabetes mellitus without complications: Secondary | ICD-10-CM | POA: Insufficient documentation

## 2018-12-20 DIAGNOSIS — S20111A Abrasion of breast, right breast, initial encounter: Secondary | ICD-10-CM | POA: Insufficient documentation

## 2018-12-20 DIAGNOSIS — I120 Hypertensive chronic kidney disease with stage 5 chronic kidney disease or end stage renal disease: Secondary | ICD-10-CM | POA: Insufficient documentation

## 2018-12-21 ENCOUNTER — Ambulatory Visit (HOSPITAL_COMMUNITY): Payer: Medicare Other | Admitting: Anesthesiology

## 2018-12-21 ENCOUNTER — Encounter (HOSPITAL_COMMUNITY)
Admission: RE | Disposition: A | Discharge status: Home / Self Care | Payer: Self-pay | Source: Home / Self Care | Attending: Vascular Surgery

## 2018-12-21 ENCOUNTER — Encounter (HOSPITAL_COMMUNITY): Payer: Self-pay

## 2018-12-21 ENCOUNTER — Ambulatory Visit (HOSPITAL_COMMUNITY)
Admission: RE | Admit: 2018-12-21 | Discharge: 2018-12-21 | Disposition: A | Discharge status: Home / Self Care | Payer: Medicare Other | Attending: Vascular Surgery | Admitting: Vascular Surgery

## 2018-12-21 ENCOUNTER — Other Ambulatory Visit: Payer: Self-pay

## 2018-12-21 ENCOUNTER — Encounter: Payer: Medicare Other | Admitting: Internal Medicine

## 2018-12-21 DIAGNOSIS — E1122 Type 2 diabetes mellitus with diabetic chronic kidney disease: Secondary | ICD-10-CM | POA: Insufficient documentation

## 2018-12-21 DIAGNOSIS — Z79899 Other long term (current) drug therapy: Secondary | ICD-10-CM | POA: Diagnosis not present

## 2018-12-21 DIAGNOSIS — I120 Hypertensive chronic kidney disease with stage 5 chronic kidney disease or end stage renal disease: Secondary | ICD-10-CM | POA: Diagnosis not present

## 2018-12-21 DIAGNOSIS — N185 Chronic kidney disease, stage 5: Secondary | ICD-10-CM

## 2018-12-21 DIAGNOSIS — N186 End stage renal disease: Secondary | ICD-10-CM | POA: Insufficient documentation

## 2018-12-21 DIAGNOSIS — Z992 Dependence on renal dialysis: Secondary | ICD-10-CM | POA: Insufficient documentation

## 2018-12-21 DIAGNOSIS — Z794 Long term (current) use of insulin: Secondary | ICD-10-CM | POA: Diagnosis not present

## 2018-12-21 HISTORY — DX: Anemia, unspecified: D64.9

## 2018-12-21 HISTORY — PX: BASCILIC VEIN TRANSPOSITION: SHX5742

## 2018-12-21 LAB — POCT I-STAT, CHEM 8
BUN: 18 mg/dL (ref 6–20)
Calcium, Ion: 1.2 mmol/L (ref 1.15–1.40)
Chloride: 102 mmol/L (ref 98–111)
Creatinine, Ser: 5.8 mg/dL — ABNORMAL HIGH (ref 0.44–1.00)
Glucose, Bld: 82 mg/dL (ref 70–99)
HCT: 33 % — ABNORMAL LOW (ref 36.0–46.0)
Hemoglobin: 11.2 g/dL — ABNORMAL LOW (ref 12.0–15.0)
Potassium: 3.4 mmol/L — ABNORMAL LOW (ref 3.5–5.1)
Sodium: 141 mmol/L (ref 135–145)
TCO2: 26 mmol/L (ref 22–32)

## 2018-12-21 LAB — GLUCOSE, CAPILLARY
Glucose-Capillary: 65 mg/dL — ABNORMAL LOW (ref 70–99)
Glucose-Capillary: 118 mg/dL — ABNORMAL HIGH (ref 70–99)
Glucose-Capillary: 93 mg/dL (ref 70–99)
Glucose-Capillary: 149 mg/dL — ABNORMAL HIGH (ref 70–99)

## 2018-12-21 SURGERY — TRANSPOSITION, VEIN, BASILIC
Anesthesia: Monitor Anesthesia Care | Site: Arm Upper | Laterality: Left

## 2018-12-21 MED ORDER — HEPARIN SODIUM (PORCINE) 1000 UNIT/ML IJ SOLN
INTRAMUSCULAR | Status: DC | PRN
  Administered 2018-12-21: 5000 [IU] via INTRAVENOUS

## 2018-12-21 MED ORDER — PHENYLEPHRINE 40 MCG/ML (10ML) SYRINGE FOR IV PUSH (FOR BLOOD PRESSURE SUPPORT)
PREFILLED_SYRINGE | INTRAVENOUS | Status: DC | PRN
  Administered 2018-12-21 (×4): 80 ug via INTRAVENOUS

## 2018-12-21 MED ORDER — PHENYLEPHRINE 40 MCG/ML (10ML) SYRINGE FOR IV PUSH (FOR BLOOD PRESSURE SUPPORT)
PREFILLED_SYRINGE | INTRAVENOUS | Status: AC
  Filled 2018-12-21: qty 10

## 2018-12-21 MED ORDER — LIDOCAINE 2% (20 MG/ML) 5 ML SYRINGE
INTRAMUSCULAR | Status: DC | PRN
  Administered 2018-12-21: 80 mg via INTRAVENOUS

## 2018-12-21 MED ORDER — LIDOCAINE HCL 1 % IJ SOLN
INTRAMUSCULAR | Status: DC | PRN
  Administered 2018-12-21: 30 mL

## 2018-12-21 MED ORDER — SODIUM CHLORIDE 0.9 % IV SOLN
INTRAVENOUS | Status: DC
  Administered 2018-12-21: 11:00:00 via INTRAVENOUS

## 2018-12-21 MED ORDER — SODIUM CHLORIDE 0.9 % IV SOLN
INTRAVENOUS | Status: DC | PRN
  Administered 2018-12-21: 500 mL

## 2018-12-21 MED ORDER — ONDANSETRON HCL 4 MG/2ML IJ SOLN: 4.0000 mg | Freq: Once | INTRAMUSCULAR | Status: DC | PRN

## 2018-12-21 MED ORDER — LIDOCAINE HCL (PF) 1 % IJ SOLN
INTRAMUSCULAR | Status: AC
  Filled 2018-12-21: qty 30

## 2018-12-21 MED ORDER — HYDROCODONE-ACETAMINOPHEN 5-325 MG PO TABS: 1.0000 | ORAL_TABLET | Freq: Four times a day (QID) | ORAL | 0 refills | Status: DC | PRN

## 2018-12-21 MED ORDER — FENTANYL CITRATE (PF) 250 MCG/5ML IJ SOLN
INTRAMUSCULAR | Status: DC | PRN
  Administered 2018-12-21 (×2): 50 ug via INTRAVENOUS

## 2018-12-21 MED ORDER — PROPOFOL 10 MG/ML IV BOLUS
INTRAVENOUS | Status: DC | PRN
  Administered 2018-12-21: 50 mg via INTRAVENOUS

## 2018-12-21 MED ORDER — HEPARIN SODIUM (PORCINE) 1000 UNIT/ML DIALYSIS
4000.0000 [IU] | Freq: Once | INTRAMUSCULAR | Status: DC
  Filled 2018-12-21: qty 4

## 2018-12-21 MED ORDER — DEXTROSE 50 % IV SOLN
INTRAVENOUS | Status: AC
  Administered 2018-12-21: 12.5 g via INTRAVENOUS
  Filled 2018-12-21: qty 50

## 2018-12-21 MED ORDER — MIDAZOLAM HCL 5 MG/5ML IJ SOLN
INTRAMUSCULAR | Status: DC | PRN
  Administered 2018-12-21: 2 mg via INTRAVENOUS

## 2018-12-21 MED ORDER — LIDOCAINE-PRILOCAINE 2.5-2.5 % EX CREA
1.0000 "application " | TOPICAL_CREAM | CUTANEOUS | Status: DC | PRN
  Filled 2018-12-21: qty 5

## 2018-12-21 MED ORDER — ALTEPLASE 2 MG IJ SOLR
2.0000 mg | Freq: Once | INTRAMUSCULAR | Status: DC | PRN
  Filled 2018-12-21: qty 2

## 2018-12-21 MED ORDER — PROPOFOL 500 MG/50ML IV EMUL
INTRAVENOUS | Status: DC | PRN
  Administered 2018-12-21: 100 ug/kg/min via INTRAVENOUS

## 2018-12-21 MED ORDER — DEXTROSE 50 % IV SOLN
INTRAVENOUS | Status: DC | PRN
  Administered 2018-12-21: 25 mL via INTRAVENOUS

## 2018-12-21 MED ORDER — HEPARIN SODIUM (PORCINE) 1000 UNIT/ML IJ SOLN
INTRAMUSCULAR | Status: AC
  Filled 2018-12-21: qty 1

## 2018-12-21 MED ORDER — MIDAZOLAM HCL 2 MG/2ML IJ SOLN
INTRAMUSCULAR | Status: AC
  Filled 2018-12-21: qty 2

## 2018-12-21 MED ORDER — 0.9 % SODIUM CHLORIDE (POUR BTL) OPTIME
TOPICAL | Status: DC | PRN
  Administered 2018-12-21: 1000 mL

## 2018-12-21 MED ORDER — LIDOCAINE HCL (PF) 1 % IJ SOLN
5.0000 mL | INTRAMUSCULAR | Status: DC | PRN
  Filled 2018-12-21: qty 5

## 2018-12-21 MED ORDER — DEXTROSE 50 % IV SOLN
12.5000 g | INTRAVENOUS | Status: AC
  Administered 2018-12-21: 11:00:00 12.5 g via INTRAVENOUS

## 2018-12-21 MED ORDER — FENTANYL CITRATE (PF) 100 MCG/2ML IJ SOLN: 25.0000 ug | INTRAMUSCULAR | Status: DC | PRN

## 2018-12-21 MED ORDER — FENTANYL CITRATE (PF) 250 MCG/5ML IJ SOLN
INTRAMUSCULAR | Status: AC
  Filled 2018-12-21: qty 5

## 2018-12-21 MED ORDER — CEFAZOLIN SODIUM 1 G IJ SOLR
INTRAMUSCULAR | Status: AC
  Filled 2018-12-21: qty 20

## 2018-12-21 MED ORDER — LIDOCAINE 2% (20 MG/ML) 5 ML SYRINGE
INTRAMUSCULAR | Status: AC
  Filled 2018-12-21: qty 10

## 2018-12-21 MED ORDER — ONDANSETRON HCL 4 MG/2ML IJ SOLN
INTRAMUSCULAR | Status: AC
  Filled 2018-12-21: qty 2

## 2018-12-21 MED ORDER — SODIUM CHLORIDE 0.9 % IV SOLN
INTRAVENOUS | Status: AC
  Filled 2018-12-21: qty 1.2

## 2018-12-21 MED ORDER — SODIUM CHLORIDE 0.9 % IV SOLN
100.0000 mL | INTRAVENOUS | Status: DC | PRN
  Administered 2018-12-21: 13:00:00 via INTRAVENOUS

## 2018-12-21 MED ORDER — OXYCODONE HCL 5 MG/5ML PO SOLN: 5.0000 mg | Freq: Once | ORAL | Status: DC | PRN

## 2018-12-21 MED ORDER — PENTAFLUOROPROP-TETRAFLUOROETH EX AERO
1.0000 "application " | INHALATION_SPRAY | CUTANEOUS | Status: DC | PRN
  Filled 2018-12-21: qty 116

## 2018-12-21 MED ORDER — CEFAZOLIN SODIUM-DEXTROSE 2-4 GM/100ML-% IV SOLN
2.0000 g | INTRAVENOUS | Status: AC
  Administered 2018-12-21: 13:00:00 2 g via INTRAVENOUS

## 2018-12-21 MED ORDER — HEPARIN SODIUM (PORCINE) 1000 UNIT/ML DIALYSIS
1000.0000 [IU] | INTRAMUSCULAR | Status: DC | PRN
  Filled 2018-12-21: qty 1

## 2018-12-21 MED ORDER — ONDANSETRON HCL 4 MG/2ML IJ SOLN
INTRAMUSCULAR | Status: DC | PRN
  Administered 2018-12-21: 4 mg via INTRAVENOUS

## 2018-12-21 MED ORDER — SODIUM CHLORIDE 0.9 % IV SOLN: 100.0000 mL | INTRAVENOUS | Status: DC | PRN

## 2018-12-21 MED ORDER — OXYCODONE HCL 5 MG PO TABS: 5.0000 mg | ORAL_TABLET | Freq: Once | ORAL | Status: DC | PRN

## 2018-12-21 SURGICAL SUPPLY — 42 items
ARMBAND PINK RESTRICT EXTREMIT (MISCELLANEOUS) ×2 IMPLANT
CANISTER SUCT 3000ML PPV (MISCELLANEOUS) ×2 IMPLANT
CLIP VESOCCLUDE MED 24/CT (CLIP) ×2 IMPLANT
CLIP VESOCCLUDE SM WIDE 24/CT (CLIP) ×2 IMPLANT
COVER PROBE W GEL 5X96 (DRAPES) ×2 IMPLANT
COVER WAND RF STERILE (DRAPES) ×2 IMPLANT
DECANTER SPIKE VIAL GLASS SM (MISCELLANEOUS) ×2 IMPLANT
DERMABOND ADVANCED (GAUZE/BANDAGES/DRESSINGS) ×1
DERMABOND ADVANCED .7 DNX12 (GAUZE/BANDAGES/DRESSINGS) ×1 IMPLANT
ELECT REM PT RETURN 9FT ADLT (ELECTROSURGICAL) ×2
ELECTRODE REM PT RTRN 9FT ADLT (ELECTROSURGICAL) ×1 IMPLANT
GLOVE BIO SURGEON STRL SZ7.5 (GLOVE) ×2 IMPLANT
GLOVE BIOGEL PI IND STRL 6.5 (GLOVE) IMPLANT
GLOVE BIOGEL PI IND STRL 8 (GLOVE) ×1 IMPLANT
GLOVE BIOGEL PI INDICATOR 6.5 (GLOVE) ×1
GLOVE BIOGEL PI INDICATOR 8 (GLOVE) ×1
GOWN STRL REUS W/ TWL LRG LVL3 (GOWN DISPOSABLE) ×2 IMPLANT
GOWN STRL REUS W/ TWL XL LVL3 (GOWN DISPOSABLE) ×2 IMPLANT
GOWN STRL REUS W/TWL LRG LVL3 (GOWN DISPOSABLE) ×3
GOWN STRL REUS W/TWL XL LVL3 (GOWN DISPOSABLE) ×2
HEMOSTAT SPONGE AVITENE ULTRA (HEMOSTASIS) IMPLANT
KIT BASIN OR (CUSTOM PROCEDURE TRAY) ×2 IMPLANT
KIT TURNOVER KIT B (KITS) ×2 IMPLANT
NDL HYPO 25GX1X1/2 BEV (NEEDLE) ×1 IMPLANT
NEEDLE HYPO 25GX1X1/2 BEV (NEEDLE) ×2 IMPLANT
NS IRRIG 1000ML POUR BTL (IV SOLUTION) ×2 IMPLANT
PACK CV ACCESS (CUSTOM PROCEDURE TRAY) ×2 IMPLANT
PAD ARMBOARD 7.5X6 YLW CONV (MISCELLANEOUS) ×4 IMPLANT
SUT MNCRL AB 4-0 PS2 18 (SUTURE) ×2 IMPLANT
SUT PROLENE 5 0 C 1 24 (SUTURE) ×1 IMPLANT
SUT PROLENE 6 0 BV (SUTURE) ×4 IMPLANT
SUT PROLENE 7 0 BV 1 (SUTURE) IMPLANT
SUT SILK 2 0 SH (SUTURE) IMPLANT
SUT SILK 3 0 (SUTURE) ×1
SUT SILK 3-0 18XBRD TIE 12 (SUTURE) IMPLANT
SUT VIC AB 2-0 CT1 27 (SUTURE) ×1
SUT VIC AB 2-0 CT1 TAPERPNT 27 (SUTURE) ×1 IMPLANT
SUT VIC AB 3-0 SH 27 (SUTURE) ×2
SUT VIC AB 3-0 SH 27X BRD (SUTURE) ×2 IMPLANT
TOWEL GREEN STERILE (TOWEL DISPOSABLE) ×2 IMPLANT
UNDERPAD 30X30 (UNDERPADS AND DIAPERS) ×2 IMPLANT
WATER STERILE IRR 1000ML POUR (IV SOLUTION) ×2 IMPLANT

## 2018-12-21 NOTE — Transfer of Care (Signed)
Immediate Anesthesia Transfer of Care Note  Patient: [name]  Procedure(s) Performed: Left arm BASILIC VEIN TRANSPOSITION SECOND STAGE (Left Arm Upper)  Patient Location: PACU  Anesthesia Type:General  Level of Consciousness: sedated and responds to stimulation  Airway & Oxygen Therapy: Patient Spontanous Breathing and Patient connected to nasal cannula oxygen  Post-op Assessment: Report given to RN, Post -op Vital signs reviewed and stable and Patient moving all extremities  Post vital signs: Reviewed and stable  Last Vitals:  Vitals Value Taken Time  BP 136/93 12/21/18 1501  Temp    Pulse 101 12/21/18 1502  Resp 24 12/21/18 1502  SpO2 100 % 12/21/18 1502  Vitals shown include unvalidated device data.  Last Pain:  Vitals:   12/21/18 1120  TempSrc: Oral  PainSc:          Complications: No apparent anesthesia complications

## 2018-12-21 NOTE — Anesthesia Postprocedure Evaluation (Signed)
Anesthesia Post Note  Patient: [name]  Procedure(s) Performed: Left arm BASILIC VEIN TRANSPOSITION SECOND STAGE (Left Arm Upper)     Patient location during evaluation: PACU Anesthesia Type: General Level of consciousness: awake and alert Pain management: pain level controlled Vital Signs Assessment: post-procedure vital signs reviewed and stable Respiratory status: spontaneous breathing, nonlabored ventilation and respiratory function stable Cardiovascular status: blood pressure returned to baseline and stable Postop Assessment: no apparent nausea or vomiting Anesthetic complications: no    Last Vitals:  Vitals:   12/21/18 1530 12/21/18 1545  BP: (!) 130/93 (!) 131/92  Pulse: (!) 102 100  Resp: 20 16  Temp:  36.6 C  SpO2: 93% 97%    Last Pain:  Vitals:   12/21/18 1120  TempSrc: Oral  PainSc:                  [name]

## 2018-12-21 NOTE — H&P (Signed)
History and Physical Interval Note:  12/21/2018 1:03 PM  [name]  has presented today for surgery, with the diagnosis of end stage renal disease.  The various methods of treatment have been discussed with the patient and family. After consideration of risks, benefits and other options for treatment, the patient has consented to  Procedure(s): BASILIC VEIN TRANSPOSITION SECOND STAGE (Left) as a surgical intervention.  The patient's history has been reviewed, patient examined, no change in status, stable for surgery.  I have reviewed the patient's chart and labs.  Questions were answered to the patient's satisfaction.    Left second stage basilic vein fistula.  [name]  Established Dialysis Access   History of Present Illness   [name] is a 33 y.o. (18-Feb-1986) female who is status post left first stage basilic vein transposition by Dr. Carlis [name] on 10/26/2018.  Patient states her incision is completely healed and she denies any signs or symptoms of steal syndrome in her left hand.  She is dialyzing via right [facility] on a Monday Wednesday Friday schedule without complication.  She is willing to proceed with second stage basilic vein transposition on a Tuesday or Thursday in the near future.  She does not take any blood thinners.  She does not have a pacemaker.  The patient's PMH, PSH, SH, and FamHx were reviewed and are unchanged from prior visit.        Current Outpatient Medications  Medication Sig Dispense Refill  . ACCU-CHEK SOFTCLIX LANCETS lancets Use to check blood sugar 4 times per day. 150 each 5  . acetaminophen (TYLENOL) 500 MG tablet Take 1,000 mg by mouth daily as needed for moderate pain.     Marland Kitchen amLODipine (NORVASC) 10 MG tablet Take 1 tablet (10 mg total) by mouth daily. 30 tablet 0  . b complex-vitamin c-folic acid (NEPHRO-VITE) 0.8 MG TABS tablet Take 1 tablet by mouth every Monday, Wednesday, and Friday. After dialysis on dialysis days    .  cetirizine (ZYRTEC) 10 MG tablet Take 10 mg by mouth daily as needed for allergies.    Marland Kitchen dicyclomine (BENTYL) 20 MG tablet Take 1 tablet (20 mg total) by mouth 2 (two) times daily. 60 tablet 0  . famotidine (PEPCID) 40 MG tablet Take 40 mg by mouth daily.     . fluticasone (FLONASE) 50 MCG/ACT nasal spray Place 2 sprays into both nostrils daily as needed for allergies.    Marland Kitchen gabapentin (NEURONTIN) 300 MG capsule Take 300 mg by mouth 2 (two) times daily.     . hydrOXYzine (ATARAX/VISTARIL) 50 MG tablet Take 1 tablet (50 mg total) by mouth 2 (two) times daily as needed for itching. 30 tablet 0  . insulin aspart (NOVOLOG FLEXPEN) 100 UNIT/ML FlexPen Inject 12-15 Units into the skin See admin instructions. 5-6 times per day as needed for blood sugar management    . insulin degludec (TRESIBA) 100 UNIT/ML SOPN FlexTouch Pen Inject 0.6 mLs (60 Units total) into the skin 2 (two) times daily. Reports taking 24 units QAM (Patient taking differently: Inject 20 Units into the skin daily. ) 4 pen 3  . loperamide (IMODIUM) 2 MG capsule Take 2 mg by mouth as needed for diarrhea or loose stools.     . metoCLOPramide (REGLAN) 10 MG tablet Take 1 tablet (10 mg total) by mouth 3 (three) times daily before meals. 90 tablet 3  . pantoprazole (PROTONIX) 40 MG tablet Take 40 mg by mouth 2 (two) times daily.    Marland Kitchen  predniSONE (DELTASONE) 10 MG tablet Take 1 tablet (10 mg total) by mouth every morning. 30 tablet 3  . promethazine (PHENERGAN) 25 MG tablet Take 1 tablet (25 mg total) by mouth every 6 (six) hours as needed for nausea or vomiting. 30 tablet 0  . sevelamer carbonate (RENVELA) 0.8 g PACK packet Take 2 packets by mouth 3 (three) times daily with meals.    . simvastatin (ZOCOR) 20 MG tablet Take 20 mg by mouth at bedtime.      No current facility-administered medications for this visit.     On ROS today: 10 system ROS is negative unless otherwise noted in HPI   Physical Examination       Vitals:   12/05/18 1452  BP: 125/89  Pulse: (!) 104  Resp: 14  Temp: 97.9 F (36.6 C)  TempSrc: Temporal  SpO2: 100%  Weight: 165 lb 3.2 oz (74.9 kg)  Height: 5\' 6"  (1.676 m)   Body mass index is 26.66 kg/m.  General Alert, O x 3, WD, NAD  Pulmonary Sym exp, good B air movt, CTA B  Cardiac RRR, Nl S1, S2,  Vascular Vessel Right Left  Radial Palpable Palpable  Brachial Palpable Palpable  Ulnar Not palpable Not palpable    Musculo- skeletal M/S 5/5 throughout  , Extremities without ischemic changes; palpable left radial pulse; palpable thrill from fistula anastomosis to mid upper arm; grip strength intact left hand    Neurologic A&O; CN grossly intact     Non-invasive Vascular Imaging   left Arm Access Duplex  (12/05/18):   Diameters:  >6 mm  Depth:  8 mm  PSV:  200 c/s   Medical Decision Making   [name] is a 33 y.o. female who presents with ESRD requiring hemodialysis.    Patent left brachial basilic fistula without signs or symptoms of steal syndrome left hand  Based on fistula duplex today, the fistula is mature enough to proceed with second stage basilic vein transposition  This will be scheduled with Dr. Carlis [name] on a Tuesday or Thursday in the near future  Risk, benefits, and alternatives to access surgery were discussed.    The patient is aware the risks include but are not limited to: bleeding, infection, steal syndrome, nerve damage, thrombosis, failure to mature, and need for additional procedures.    The patient agrees to proceed with the procedure.   Valerie Ligas PA-C [facility] Office: [phone]  Clinic MD: Dr. Carlis [name]

## 2018-12-21 NOTE — Anesthesia Procedure Notes (Signed)
Procedure Name: LMA Insertion Date/Time: 12/21/2018 1:51 PM Performed by: Myna Bright, CRNA Pre-anesthesia Checklist: Patient identified, Emergency Drugs available, Suction available and Patient being monitored Patient Re-evaluated:Patient Re-evaluated prior to induction Oxygen Delivery Method: Circle system utilized Preoxygenation: Pre-oxygenation with 100% oxygen Induction Type: IV induction Ventilation: Mask ventilation without difficulty LMA: LMA inserted LMA Size: 4.0 Tube type: Oral Number of attempts: 1 Placement Confirmation: positive ETCO2 and breath sounds checked- equal and bilateral Tube secured with: Tape Dental Injury: Teeth and Oropharynx as per pre-operative assessment

## 2018-12-21 NOTE — Anesthesia Preprocedure Evaluation (Addendum)
Anesthesia Evaluation  Patient identified by MRN, date of birth, ID band Patient awake    Reviewed: Allergy & Precautions, NPO status , Patient's Chart, lab work & pertinent test results  History of Anesthesia Complications Negative for: history of anesthetic complications  Airway Mallampati: II  TM Distance: >3 FB Neck ROM: Full    Dental  (+) Dental Advisory Given   Pulmonary neg pulmonary ROS,    Pulmonary exam normal        Cardiovascular hypertension (no longer on meds), Normal cardiovascular exam     Neuro/Psych negative neurological ROS  negative psych ROS   GI/Hepatic Neg liver ROS, GERD  Medicated and Controlled,  Endo/Other  diabetes, Insulin Dependent  Renal/GU ESRF and DialysisRenal disease Hx renal transplant 2007      Musculoskeletal negative musculoskeletal ROS (+)   Abdominal   Peds  Hematology  (+) anemia ,   Anesthesia Other Findings XXX syndrome   Reproductive/Obstetrics                            Anesthesia Physical Anesthesia Plan  ASA: III  Anesthesia Plan: MAC   Post-op Pain Management:    Induction: Intravenous  PONV Risk Score and Plan: 2 and Propofol infusion and Treatment may vary due to age or medical condition  Airway Management Planned: Natural Airway and Simple Face Mask  Additional Equipment: None  Intra-op Plan:   Post-operative Plan:   Informed Consent: I have reviewed the patients History and Physical, chart, labs and discussed the procedure including the risks, benefits and alternatives for the proposed anesthesia with the patient or authorized representative who has indicated his/her understanding and acceptance.       Plan Discussed with: CRNA and Anesthesiologist  Anesthesia Plan Comments:        Anesthesia Quick Evaluation

## 2018-12-21 NOTE — Discharge Instructions (Signed)
° °  [facility] ° °Discharge Instructions ° °AV Fistula or Graft Surgery for Dialysis Access ° °Please refer to the following instructions for your post-procedure care. Your surgeon or physician assistant will discuss any changes with you. ° °Activity ° °You may drive the day following your surgery, if you are comfortable and no longer taking prescription pain medication. Resume full activity as the soreness in your incision resolves. ° °Bathing/Showering ° °You may shower after you go home. Keep your incision dry for 48 hours. Do not soak in a bathtub, hot tub, or swim until the incision heals completely. You may not shower if you have a hemodialysis catheter. ° °Incision Care ° °Clean your incision with mild soap and water after 48 hours. Pat the area dry with a clean towel. You do not need a bandage unless otherwise instructed. Do not apply any ointments or creams to your incision. You may have skin glue on your incision. Do not peel it off. It will come off on its own in about one week. Your arm may swell a bit after surgery. To reduce swelling use pillows to elevate your arm so it is above your heart. Your doctor will tell you if you need to lightly wrap your arm with an ACE bandage. ° °Diet ° °Resume your normal diet. There are not special food restrictions following this procedure. In order to heal from your surgery, it is CRITICAL to get adequate nutrition. Your body requires vitamins, minerals, and protein. Vegetables are the best source of vitamins and minerals. Vegetables also provide the perfect balance of protein. Processed food has little nutritional value, so try to avoid this. ° °Medications ° °Resume taking all of your medications. If your incision is causing pain, you may take over-the counter pain relievers such as acetaminophen (Tylenol). If you were prescribed a stronger pain medication, please be aware these medications can cause nausea and constipation. Prevent  nausea by taking the medication with a snack or meal. Avoid constipation by drinking plenty of fluids and eating foods with high amount of fiber, such as fruits, vegetables, and grains. Do not take Tylenol if you are taking prescription pain medications. ° ° ° ° °Follow up °Your surgeon may want to see you in the office following your access surgery. If so, this will be arranged at the time of your surgery. ° °Please call us immediately for any of the following conditions: ° °Increased pain, redness, drainage (pus) from your incision site °Fever of 101 degrees or higher °Severe or worsening pain at your incision site °Hand pain or numbness. ° °Reduce your risk of vascular disease: ° °Stop smoking. If you would like help, call [facility] at 1-800-QUIT-NOW (1-800-784-8669) or [place] at 336-586-4000 ° °Manage your cholesterol °Maintain a desired weight °Control your diabetes °Keep your blood pressure down ° °Dialysis ° °It will take several weeks to several months for your new dialysis access to be ready for use. Your surgeon will determine when it is OK to use it. Your nephrologist will continue to direct your dialysis. You can continue to use your Permcath until your new access is ready for use. ° °If you have any questions, please call the office at [phone]. ° °

## 2018-12-21 NOTE — Progress Notes (Signed)
Spoke to Dr. Fransisco Beau regarding urine pregnancy test.  Patient states she has never had a menstrual cycle.  Patient does not produce urine.  Dr. Fransisco Beau said he does not need a pregnancy test.   Also, informed Dr. Fransisco Beau that BG was 65 and patient was given D50.  Will continue to monitor.

## 2018-12-21 NOTE — Op Note (Signed)
    OPERATIVE NOTE   PROCEDURE: left second stage basilic vein transposition (brachiobasilic arteriovenous fistula) placement  PRE-OPERATIVE DIAGNOSIS: ESRD  POST-OPERATIVE DIAGNOSIS: Same  SURGEON: [name], MD  ASSISTANT(S): Arlee Muslim, PA  ANESTHESIA: general  ESTIMATED BLOOD LOSS: <50 cc  FINDING(S): Basilic vein nicely dilated greater than 6 mm with excellent thrill.  After the vein was circumferentially mobilized through 3 skip incisions in the upper arm it was transposed with a new tunnel.  A new end-to-end vein anastomosis was performed just above the arterial anastomosis.  Has a great thrill.  SPECIMEN(S): None  INDICATIONS:   [name] is a 33 y.o. female who presents with end-stage renal disease and has undergone a left arm first stage brachiobasilic fistula.  The patient is scheduled for left second stage basilic vein transposition.  The patient is aware the risks include but are not limited to: bleeding, infection, steal syndrome, nerve damage, ischemic monomelic neuropathy, failure to mature, and need for additional procedures.  The patient is aware of the risks of the procedure and elects to proceed forward.   DESCRIPTION: After full informed written consent was obtained from the patient, the patient was brought back to the operating room and placed supine upon the operating table.  Prior to induction, the patient received IV antibiotics.   After obtaining adequate anesthesia, the patient was then prepped and draped in the standard fashion for a left arm access procedure.  I turned my attention first to identifying the patient's brachiobasilic arteriovenous fistula.  Using SonoSite guidance, the location of this fistula was marked out on the skin.    This was an excellent caliber vein.  I made three longitudinal incisions on the medial aspect of the left upper arm.  Through these incisions I dissected out circumferentially the basilic vein, taking care to  protect the nerve.  Once the vein was fully mobilized, all side branches were ligated between 3-0 silk ties and divided.  The vein was marked for orientation.  I then used a curved tunneler to create a subcutaneous tunnel.  The vein was then transected near the antecubital crease.  It was then brought to the previously created tunnel making sure to maintain proper orientation.  A primary anastomosis was then performed between the two cut ends of the vein with a running 6-0 Prolene.  Once this was done the clamps were released.  There was excellent flow through the fistula.  Hemostasis was then achieved.  The wound was irrigated.  The incision was closed with a deep layer of 3-0 Vicryl followed by a subcutaneous 4-0 Monocryl and Dermabond.  There were no immediate complications.  COMPLICATIONS: None  CONDITION: Stable  [name], MD [facility] Office: [phone] Pager: [phone]  12/21/2018, 2:46 PM

## 2018-12-22 ENCOUNTER — Encounter (HOSPITAL_COMMUNITY): Payer: Self-pay | Admitting: Vascular Surgery

## 2018-12-27 ENCOUNTER — Other Ambulatory Visit: Payer: Self-pay

## 2018-12-27 ENCOUNTER — Encounter: Payer: Medicare Other | Admitting: Internal Medicine

## 2018-12-27 DIAGNOSIS — T2121XA Burn of second degree of chest wall, initial encounter: Secondary | ICD-10-CM | POA: Diagnosis not present

## 2018-12-27 DIAGNOSIS — X158XXA Contact with other hot household appliances, initial encounter: Secondary | ICD-10-CM | POA: Diagnosis not present

## 2018-12-27 DIAGNOSIS — Z992 Dependence on renal dialysis: Secondary | ICD-10-CM | POA: Diagnosis not present

## 2018-12-27 DIAGNOSIS — Z6828 Body mass index (BMI) 28.0-28.9, adult: Secondary | ICD-10-CM | POA: Diagnosis not present

## 2018-12-27 DIAGNOSIS — L97522 Non-pressure chronic ulcer of other part of left foot with fat layer exposed: Secondary | ICD-10-CM | POA: Diagnosis not present

## 2018-12-27 DIAGNOSIS — S20111A Abrasion of breast, right breast, initial encounter: Secondary | ICD-10-CM | POA: Diagnosis not present

## 2018-12-27 DIAGNOSIS — N186 End stage renal disease: Secondary | ICD-10-CM | POA: Diagnosis not present

## 2018-12-27 DIAGNOSIS — E119 Type 2 diabetes mellitus without complications: Secondary | ICD-10-CM | POA: Diagnosis not present

## 2018-12-27 DIAGNOSIS — I120 Hypertensive chronic kidney disease with stage 5 chronic kidney disease or end stage renal disease: Secondary | ICD-10-CM | POA: Diagnosis not present

## 2018-12-28 NOTE — Progress Notes (Signed)
RICHELE, CHITTUM ([id number]) Visit Report for 12/27/2018 Arrival Information Details Patient Name: [name], [name] Date of Service: 12/27/2018 3:00 PM Medical Record Number: [id number] Patient Account Number: [id number] Date of Birth/Sex: [birth year] (33 y.o. F) Treating RN: Cornell Barman Primary Care Chasey Dull: [name] Other Clinician: Referring Georgia Baria: [name] Treating Morrie Daywalt/Extender: Tito Dine in Treatment: 7 Visit Information History Since Last Visit Added or deleted any medications: No Patient Arrived: Ambulatory Any new allergies or adverse reactions: No Arrival Time: 15:06 Had a fall or experienced change in No Accompanied By: self activities of daily living that may affect Transfer Assistance: None risk of falls: Patient Identification Verified: Yes Signs or symptoms of abuse/neglect since last visito No Secondary Verification Process Completed: Yes Hospitalized since last visit: No Implantable device outside of the clinic excluding No cellular tissue based products placed in the center since last visit: Has Dressing in Place as Prescribed: Yes Pain Present Now: No Electronic Signature(s) Signed: 12/27/2018 4:52:13 PM By: Lorine Bears RCP, RRT, CHT Entered By: Lorine Bears on 12/27/2018 15:08:28 [name] ([id number]) -------------------------------------------------------------------------------- Encounter Discharge Information Details Patient Name: [name] Date of Service: 12/27/2018 3:00 PM Medical Record Number: [id number] Patient Account Number: [id number] Date of Birth/Sex: Jul 17, 1985 (33 y.o. F) Treating RN: Cornell Barman Primary Care Dianna Deshler: [name] Other Clinician: Referring Marializ Ferrebee: [name] Treating Dameon Soltis/Extender: Tito Dine in Treatment: 7 Encounter Discharge Information Items Post Procedure Vitals Discharge Condition: Stable Temperature (F):  98.0 Ambulatory Status: Ambulatory Pulse (bpm): 97 Discharge Destination: Home Respiratory Rate (breaths/min): 16 Transportation: Private Auto Blood Pressure (mmHg): 107/79 Accompanied By: self Schedule Follow-up Appointment: Yes Clinical Summary of Care: Electronic Signature(s) Signed: 12/27/2018 5:44:53 PM By: Gretta Cool, BSN, RN, CWS, Kim RN, BSN Entered By: Gretta Cool, BSN, RN, CWS, Kim on 12/27/2018 15:36:54 [name] ([id number]) -------------------------------------------------------------------------------- Lower Extremity Assessment Details Patient Name: [name] Date of Service: 12/27/2018 3:00 PM Medical Record Number: [id number] Patient Account Number: [id number] Date of Birth/Sex: [birth year] (33 y.o. F) Treating RN: Montey Hora Primary Care Finneas Mathe: [name] Other Clinician: Referring Roger Fasnacht: [name] Treating Tim Wilhide/Extender: Ricard Dillon Weeks in Treatment: 7 Edema Assessment Assessed: [Left: No] [Right: No] Edema: [Left: N] [Right: o] Vascular Assessment Pulses: Dorsalis Pedis Palpable: [Left:Yes] Electronic Signature(s) Signed: 12/27/2018 4:44:26 PM By: Montey Hora Entered By: Montey Hora on 12/27/2018 15:17:13 [name] ([id number]) -------------------------------------------------------------------------------- Multi Wound Chart Details Patient Name: [name] Date of Service: 12/27/2018 3:00 PM Medical Record Number: [id number] Patient Account Number: [id number] Date of Birth/Sex: February 26, 1986 (33 y.o. 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 Comorbid History: Anemia, Hypertension, Type II Anemia, Hypertension, Type II N/A Diabetes, Osteoarthritis, Diabetes, Osteoarthritis, Neuropathy, Seizure Disorder Neuropathy, Seizure Disorder Date Acquired: 10/17/2018 10/17/2018 N/A Weeks of Treatment: 7 7 N/A Wound Status: Open Open N/A Measurements L x W x D 3.5x2.5x0.1 0.7x0.5x0.2 N/A (cm) Area (cm) : 6.872 0.275 N/A Volume (cm) : 0.687 0.055 N/A % Reduction in Area: 85.80% 90.30% N/A % Reduction in Volume: 85.80% 90.30% N/A Classification: Full Thickness Without Grade 2 N/A Exposed Support  Structures Exudate Amount: Medium Medium N/A Exudate Type: Serous Serosanguineous N/A Exudate Color: amber red, brown N/A Wound Margin: Flat and Intact Flat and Intact N/A Granulation Amount: Large (67-100%) Medium (34-66%) N/A Granulation Quality: Red Red N/A Necrotic Amount: Small (1-33%) Medium (34-66%) N/A Exposed Structures: Fat Layer (Subcutaneous Fat Layer (Subcutaneous N/A Tissue) Exposed: Yes Tissue) Exposed: Yes Fascia: No Fascia: No Tendon: No Tendon: No [name], [name] ([id number]) Muscle: No Muscle: No Joint: No Joint: No Bone: No Bone: No Epithelialization: Small (1-33%) None N/A Debridement: Chemical/Enzymatic/Mechanical N/A N/A Pre-procedure 15:34 N/A N/A Verification/Time Out Taken: Pain Control: Lidocaine N/A N/A Instrument: Other(tongue blade) N/A N/A Bleeding: None N/A N/A Debridement Treatment Procedure was tolerated well N/A N/A Response: Post Debridement 3.5x2.5x0.1 N/A N/A Measurements L x W x D (cm) Post Debridement Volume: 0.687 N/A N/A (cm) Procedures Performed: Debridement N/A N/A Treatment Notes Wound #8 (Right Breast) Notes Breast- santyl, gauze, abd and tape, toe silver cell Wound #9 (Left, Medial Toe Great) Notes Breast- santyl, gauze, abd  and tape, toe silver cell Electronic Signature(s) Signed: 12/27/2018 5:10:21 PM By: Linton Ham MD Entered By: Linton Ham on 12/27/2018 16:23:54 [name] ([id number]) -------------------------------------------------------------------------------- Multi-Disciplinary Care Plan Details Patient Name: [name] Date of Service: 12/27/2018 3:00 PM Medical Record Number: [id number] Patient Account Number: [id number] Date of Birth/Sex: [birth year] (33 y.o. F) Treating RN: Cornell Barman Primary Care Adreanne Yono: [name] Other Clinician: Referring Felesha Moncrieffe: [name] Treating Viann Nielson/Extender: Tito Dine in Treatment: 7 Active Inactive Necrotic Tissue Nursing Diagnoses: Impaired tissue integrity related to necrotic/devitalized tissue Goals: Necrotic/devitalized tissue will be minimized in the wound bed Date Initiated: 12/06/2018 Target Resolution Date: 12/13/2018 Goal Status: Active Patient/caregiver will verbalize understanding of reason and process for debridement of necrotic tissue Date Initiated: 12/06/2018 Target Resolution Date: 12/13/2018 Goal Status: Active Interventions: Assess patient pain level pre-, during and post procedure and prior to discharge Notes: Wound/Skin Impairment Nursing Diagnoses: Impaired tissue integrity Knowledge deficit related to smoking impact on wound healing Goals: Ulcer/skin breakdown will have a volume reduction of 30% by week 4 Date Initiated: 11/08/2018 Target Resolution Date: 12/08/2018 Goal Status: Active Interventions: Assess patient/caregiver ability to obtain necessary supplies Assess patient/caregiver ability to perform ulcer/skin care regimen upon admission and as needed Assess ulceration(s) every visit Notes: Electronic Signature(s) Signed: 12/27/2018 5:44:53 PM By: Gretta Cool, BSN, RN, CWS, Kim RN, BSN Entered By: Gretta Cool, BSN, RN, CWS, Kim on 12/27/2018 15:31:30 [name]  ([id number]) -------------------------------------------------------------------------------- Pain Assessment Details Patient Name: [name] Date of Service: 12/27/2018 3:00 PM Medical Record Number: [id number] Patient Account Number: [id number] Date of Birth/Sex: [birth year] (33 y.o. F) Treating RN: Cornell Barman Primary Care Tina Temme: [name] Other Clinician: Referring Netanel Yannuzzi: [name] Treating Delwin Raczkowski/Extender: Tito Dine in Treatment: 7 Active Problems Location of Pain Severity and Description of Pain Patient Has Paino No Site Locations Pain Management and Medication Current Pain Management: Electronic Signature(s) Signed: 12/27/2018 4:52:13 PM By: Lorine Bears RCP, RRT, CHT Signed: 12/27/2018 5:44:53 PM By: Gretta Cool, BSN, RN, CWS, Kim RN, BSN Entered By: Lorine Bears on 12/27/2018 15:08:36 [name] ([id number]) -------------------------------------------------------------------------------- Patient/Caregiver Education Details Patient Name: [name] Date of Service: 12/27/2018 3:00 PM Medical Record Number: [id number] Patient Account Number: [id number] Date of Birth/Gender: 04/30/1985 (33 y.o. F) Treating RN: Cornell Barman Primary Care Physician: [name] Other Clinician: Referring Physician: [name] Treating Physician/Extender: Tito Dine in Treatment: 7 Education Assessment Education Provided To: Patient Education Topics Provided Wound/Skin Impairment:  Handouts: Caring for Your Ulcer Methods: Demonstration, Explain/Verbal Responses: State content correctly Electronic Signature(s) Signed: 12/27/2018 5:44:53 PM By: Gretta Cool, BSN, RN, CWS, Kim RN, BSN Entered By: Gretta Cool, BSN, RN, CWS, Kim on 12/27/2018 15:35:43 [name] ([id number]) -------------------------------------------------------------------------------- Wound Assessment Details Patient Name: [name] Date  of Service: 12/27/2018 3:00 PM Medical Record Number: [id number] Patient Account Number: [id number] Date of Birth/Sex: 1985-09-10 (33 y.o. F) Treating RN: Montey Hora Primary Care Cesar Alf: [name] Other Clinician: Referring Kelleen Stolze: [name] Treating Annalie Wenner/Extender: Tito Dine in Treatment: 7 Wound Status Wound Number: 8 Primary 2nd degree Burn Etiology: Wound Location: Right Breast Wound Open Wounding Event: Thermal Burn Status: Date Acquired: 10/17/2018 Comorbid Anemia, Hypertension, Type II Diabetes, Weeks Of Treatment: 7 History: Osteoarthritis, Neuropathy, Seizure Disorder Clustered Wound: No Photos Wound Measurements Length: (cm) 3.5 Width: (cm) 2.5 Depth: (cm) 0.1 Area: (cm) 6.872 Volume: (cm) 0.687 % Reduction in Area: 85.8% % Reduction in Volume: 85.8% Epithelialization: Small (1-33%) Tunneling: No Undermining: No Wound Description Full Thickness Without Exposed Support Classification: Structures Wound Margin: Flat and Intact Exudate Medium Amount: Exudate Type: Serous Exudate Color: amber Foul Odor After Cleansing: No Slough/Fibrino Yes Wound Bed Granulation Amount: Large (67-100%) Exposed Structure Granulation Quality: Red Fascia Exposed: No Necrotic Amount: Small (1-33%) Fat Layer (Subcutaneous Tissue) Exposed: Yes Necrotic Quality: Adherent Slough Tendon Exposed: No Muscle Exposed: No Joint Exposed: No Bone Exposed: No [name], [name] ([id number]) Treatment Notes Wound #8 (Right Breast) Notes Breast- santyl, gauze, abd and tape, toe silver cell Electronic Signature(s) Signed: 12/27/2018 4:44:26 PM By: Montey Hora Entered By: Montey Hora on 12/27/2018 15:16:25 [name] ([id number]) -------------------------------------------------------------------------------- Wound Assessment Details Patient Name: [name] Date of Service: 12/27/2018 3:00 PM Medical Record Number: [id number] Patient  Account Number: [id number] Date of Birth/Sex: [birth year] (33 y.o. F) Treating RN: Montey Hora Primary Care Donisha Hoch: [name] Other Clinician: Referring Jeanann Balinski: [name] Treating Hatley Henegar/Extender: Tito Dine in Treatment: 7 Wound Status Wound Number: 9 Primary Diabetic Wound/Ulcer of the Lower Extremity Etiology: Wound Location: Left Toe Great - Medial Wound Open Wounding Event: Not Known Status: Date Acquired: 10/17/2018 Comorbid Anemia, Hypertension, Type II Diabetes, Weeks Of Treatment: 7 History: Osteoarthritis, Neuropathy, Seizure Disorder Clustered Wound: No Photos Wound Measurements Length: (cm) 0.7 % Reduction Width: (cm) 0.5 % Reduction Depth: (cm) 0.2 Epitheliali Area: (cm) 0.275 Tunneling: Volume: (cm) 0.055 Underminin in Area: 90.3% in Volume: 90.3% zation: None No g: No Wound Description Classification: Grade 2 Foul Odor A Wound Margin: Flat and Intact Slough/Fibr Exudate Amount: Medium Exudate Type: Serosanguineous Exudate Color: red, brown fter Cleansing: No ino Yes Wound Bed Granulation Amount: Medium (34-66%) Exposed Structure Granulation Quality: Red Fascia Exposed: No Necrotic Amount: Medium (34-66%) Fat Layer (Subcutaneous Tissue) Exposed: Yes Necrotic Quality: Adherent Slough Tendon Exposed: No Muscle Exposed: No Joint Exposed: No Bone Exposed: No Treatment Notes SIERRA, HINZ ([id number]) Wound #9 (Left, Medial Toe Great) Notes Breast- santyl, gauze, abd and tape, toe silver cell Electronic Signature(s) Signed: 12/27/2018 4:44:26 PM By: Montey Hora Entered By: Montey Hora on 12/27/2018 15:16:56 [name] ([id number]) -------------------------------------------------------------------------------- Martin Details Patient Name: [name] Date of Service: 12/27/2018 3:00 PM Medical Record Number: [id number] Patient Account Number: [id number] Date of Birth/Sex: 07/03/1985 (33 y.o.  F) Treating RN: Cornell Barman Primary Care Rynlee Lisbon: [name] Other Clinician: Referring Carlene Bickley: [name] Treating Lasundra Hascall/Extender: Tito Dine in Treatment: 7 Vital Signs Time Taken: 15:05 Temperature (F): 98.0 Height (in): 66 Pulse (bpm): 97  Weight (lbs): 175 Respiratory Rate (breaths/min): 16 Body Mass Index (BMI): 28.2 Blood Pressure (mmHg): 107/79 Reference Range: 80 - 120 mg / dl Electronic Signature(s) Signed: 12/27/2018 4:52:13 PM By: Lorine Bears RCP, RRT, CHT Entered By: Lorine Bears on 12/27/2018 15:09:26
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
## 2019-01-03 ENCOUNTER — Encounter: Payer: Medicare Other | Admitting: Internal Medicine

## 2019-01-03 ENCOUNTER — Other Ambulatory Visit: Payer: Self-pay

## 2019-01-03 DIAGNOSIS — L97522 Non-pressure chronic ulcer of other part of left foot with fat layer exposed: Secondary | ICD-10-CM | POA: Diagnosis not present
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
## 2019-01-10 ENCOUNTER — Ambulatory Visit: Payer: Medicare Other | Admitting: Internal Medicine

## 2019-01-17 ENCOUNTER — Other Ambulatory Visit: Payer: Self-pay

## 2019-01-17 ENCOUNTER — Encounter: Payer: Medicare Other | Attending: Internal Medicine | Admitting: Internal Medicine

## 2019-01-17 DIAGNOSIS — R509 Fever, unspecified: Secondary | ICD-10-CM | POA: Insufficient documentation

## 2019-01-17 DIAGNOSIS — X58XXXA Exposure to other specified factors, initial encounter: Secondary | ICD-10-CM | POA: Insufficient documentation

## 2019-01-17 DIAGNOSIS — M1990 Unspecified osteoarthritis, unspecified site: Secondary | ICD-10-CM | POA: Diagnosis not present

## 2019-01-17 DIAGNOSIS — E1122 Type 2 diabetes mellitus with diabetic chronic kidney disease: Secondary | ICD-10-CM | POA: Insufficient documentation

## 2019-01-17 DIAGNOSIS — D631 Anemia in chronic kidney disease: Secondary | ICD-10-CM | POA: Insufficient documentation

## 2019-01-17 DIAGNOSIS — E1140 Type 2 diabetes mellitus with diabetic neuropathy, unspecified: Secondary | ICD-10-CM | POA: Insufficient documentation

## 2019-01-17 DIAGNOSIS — L97521 Non-pressure chronic ulcer of other part of left foot limited to breakdown of skin: Secondary | ICD-10-CM | POA: Insufficient documentation

## 2019-01-17 DIAGNOSIS — N186 End stage renal disease: Secondary | ICD-10-CM | POA: Insufficient documentation

## 2019-01-17 DIAGNOSIS — Z992 Dependence on renal dialysis: Secondary | ICD-10-CM | POA: Diagnosis not present

## 2019-01-17 DIAGNOSIS — G40909 Epilepsy, unspecified, not intractable, without status epilepticus: Secondary | ICD-10-CM | POA: Diagnosis not present

## 2019-01-17 DIAGNOSIS — Z794 Long term (current) use of insulin: Secondary | ICD-10-CM | POA: Insufficient documentation

## 2019-01-17 DIAGNOSIS — T2121XA Burn of second degree of chest wall, initial encounter: Secondary | ICD-10-CM | POA: Insufficient documentation

## 2019-01-17 DIAGNOSIS — E1151 Type 2 diabetes mellitus with diabetic peripheral angiopathy without gangrene: Secondary | ICD-10-CM | POA: Diagnosis not present

## 2019-01-17 DIAGNOSIS — I120 Hypertensive chronic kidney disease with stage 5 chronic kidney disease or end stage renal disease: Secondary | ICD-10-CM | POA: Insufficient documentation

## 2019-01-17 DIAGNOSIS — Z940 Kidney transplant status: Secondary | ICD-10-CM | POA: Diagnosis not present

## 2019-01-17 DIAGNOSIS — E11621 Type 2 diabetes mellitus with foot ulcer: Secondary | ICD-10-CM | POA: Diagnosis not present

## 2019-01-18 NOTE — Progress Notes (Addendum)
ALONNAH, [name] ([id number]) Visit Report for 01/17/2019 Arrival Information Details Patient Name: [name], [name] Date of Service: 01/17/2019 3:45 PM Medical Record Number: [id number] Patient Account Number: [id number] Date of Birth/Sex: 05-Feb-1986 (33 y.o. F) Treating RN: Cornell Barman Primary Care Maeghan Canny: [name] Other Clinician: Referring Shashana Fullington: [name] Treating Damara Klunder/Extender: Tito Dine in Treatment: 10 Visit Information History Since Last Visit Added or deleted any medications: No Patient Arrived: Ambulatory Any new allergies or adverse reactions: No Arrival Time: 15:54 Had a fall or experienced change in No Accompanied By: self activities of daily living that may affect Transfer Assistance: None risk of falls: Patient Identification Verified: Yes Signs or symptoms of abuse/neglect since last visito No Secondary Verification Process Completed: Yes Hospitalized since last visit: No Has Dressing in Place as Prescribed: Yes Pain Present Now: No Electronic Signature(s) Signed: 01/18/2019 10:34:41 AM By: Lorine Bears RCP, RRT, CHT Entered By: Lorine Bears on 01/17/2019 15:57:14 [name] ([id number]) -------------------------------------------------------------------------------- Lower Extremity Assessment Details Patient Name: [name] Date of Service: 01/17/2019 3:45 PM Medical Record Number: [id number] Patient Account Number: [id number] Date of Birth/Sex: 11-18-1985 (33 y.o. F) Treating RN: Harold Barban Primary Care Annayah Worthley: [name] Other Clinician: Referring Sabien Umland: [name] Treating Pauline Trainer/Extender: Ricard Dillon Weeks in Treatment: 10 Vascular Assessment Pulses: Dorsalis Pedis Palpable: [Left:Yes] Posterior Tibial Palpable: [Left:Yes] Electronic Signature(s) Signed: 01/18/2019 4:15:06 PM By: Harold Barban Entered By: Harold Barban on 01/17/2019 16:09:17 [name] ([id number]) -------------------------------------------------------------------------------- Multi Wound Chart Details Patient Name: [name] Date of Service: 01/17/2019 3:45 PM Medical Record Number: [id number] Patient Account Number: [id number] Date of Birth/Sex: [birth year] (33 y.o. 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 Comorbid History: Anemia, Hypertension, Type II Anemia, Hypertension, Type II N/A Diabetes, Osteoarthritis, Diabetes, Osteoarthritis, Neuropathy, Seizure Disorder Neuropathy, Seizure Disorder Date Acquired: 10/17/2018 10/17/2018 N/A Weeks of Treatment: 10 10 N/A Wound Status: Open Open N/A Measurements L x W x D 0.8x1x0.1 0.1x0.6x0.3 N/A (cm) Area (cm) : 0.628 0.047 N/A Volume (cm) : 0.063 0.014 N/A % Reduction in Area: 98.70% 98.30% N/A % Reduction in Volume: 98.70% 97.50% N/A Classification: Full Thickness Without Grade 2 N/A Exposed Support Structures Exudate Amount: Medium Medium N/A Exudate Type: Serous Serosanguineous N/A Exudate Color: amber red, brown N/A Wound Margin: Flat and Intact Flat and Intact N/A Granulation Amount: Large (67-100%) Small (1-33%) N/A Granulation Quality: Red Red N/A Necrotic Amount: None Present (0%) Large (67-100%) N/A Necrotic Tissue: N/A Eschar N/A Exposed Structures: Fat Layer (Subcutaneous Fat Layer (Subcutaneous N/A Tissue) Exposed: Yes Tissue) Exposed: Yes Fascia: No Fascia: No SHAIANNE, GROSSBERG ([id number]) Tendon: No Tendon: No Muscle: No Muscle: No Joint: No Joint: No Bone: No Bone: No Epithelialization: Medium (34-66%) Medium (34-66%) N/A Debridement: N/A Debridement - Excisional N/A Pre-procedure N/A 16:20 N/A Verification/Time Out Taken: Pain Control: N/A Lidocaine N/A Tissue Debrided: N/A Callus, Subcutaneous N/A Level: N/A Skin/Subcutaneous Tissue N/A Debridement Area (sq cm): N/A 0.06 N/A Instrument: N/A Curette N/A Bleeding: N/A Minimum N/A Hemostasis Achieved: N/A Pressure N/A Debridement Treatment N/A Procedure was tolerated well N/A Response:  Post Debridement N/A 0.1x0.6x0.3 N/A Measurements L x W x D (cm) Post Debridement Volume: N/A 0.014 N/A (cm) Procedures Performed: N/A Debridement N/A Treatment Notes Electronic Signature(s) Signed: 01/19/2019 7:47:21 AM By: Linton Ham MD Previous Signature: 01/18/2019 7:44:31 AM Version By: Gretta Cool, BSN, RN, CWS, Kim RN, BSN Entered By: Linton Ham on 01/19/2019 07:40:04 [name] ([id number]) -------------------------------------------------------------------------------- Multi-Disciplinary Care Plan Details Patient Name: [name] Date of Service: 01/17/2019 3:45 PM Medical Record Number: [id number] Patient Account Number: [id number] Date of Birth/Sex: 1985/08/23 (33 y.o. F) Treating RN: Cornell Barman Primary Care Shafin Pollio: [name] Other Clinician: Referring Asta Corbridge: [name] Treating Nikyah Lackman/Extender: Tito Dine in Treatment: 10 Active Inactive Necrotic Tissue Nursing Diagnoses: Impaired tissue integrity related to necrotic/devitalized tissue Goals: Necrotic/devitalized tissue will be minimized in the wound bed Date Initiated: 12/06/2018 Target Resolution Date: 12/13/2018 Goal Status: Active Patient/caregiver will verbalize understanding of reason and process for debridement of necrotic tissue Date Initiated: 12/06/2018 Target Resolution Date:  12/13/2018 Goal Status: Active Interventions: Assess patient pain level pre-, during and post procedure and prior to discharge Notes: Wound/Skin Impairment Nursing Diagnoses: Impaired tissue integrity Knowledge deficit related to smoking impact on wound healing Goals: Ulcer/skin breakdown will have a volume reduction of 30% by week 4 Date Initiated: 11/08/2018 Target Resolution Date: 12/08/2018 Goal Status: Active Interventions: Assess patient/caregiver ability to obtain necessary supplies Assess patient/caregiver ability to perform ulcer/skin care regimen upon admission and as needed Assess ulceration(s) every visit Notes: Electronic Signature(s) Signed: 01/18/2019 5:19:38 PM By: Gretta Cool, BSN, RN, CWS, Kim RN, BSN Entered By: Gretta Cool, BSN, RN, CWS, Kim on 01/17/2019 16:20:41 [name] ([id number]) -------------------------------------------------------------------------------- Pain Assessment Details Patient Name: [name] Date of Service: 01/17/2019 3:45 PM Medical Record Number: [id number] Patient Account Number: [id number] Date of Birth/Sex: 05-Oct-1985 (33 y.o. F) Treating RN: Harold Barban Primary Care Santonio Speakman: [name] Other Clinician: Referring Beula Joyner: [name] Treating Nishtha Raider/Extender: Tito Dine in Treatment: 10 Active Problems Location of Pain Severity and Description of Pain Patient Has Paino No Site Locations Pain Management and Medication Current Pain Management: Electronic Signature(s) Signed: 01/18/2019 4:15:06 PM By: Harold Barban Entered By: Harold Barban on 01/17/2019 16:01:27 [name] ([id number]) -------------------------------------------------------------------------------- Patient/Caregiver Education Details Patient Name: [name] Date of Service: 01/17/2019 3:45 PM Medical Record Number: [id number] Patient Account Number: [id number] Date of Birth/Gender: 01/11/1986 (33 y.o. F) Treating RN: Cornell Barman Primary Care Physician: [name] Other Clinician: Referring Physician: [name] Treating Physician/Extender: Tito Dine in Treatment: 10 Education Assessment Education Provided To: Patient Education Topics Provided Wound/Skin Impairment: Handouts: Caring for Your Ulcer Methods: Demonstration, Explain/Verbal Responses: State content correctly Electronic Signature(s) Signed: 01/18/2019 5:19:38 PM By: Gretta Cool, BSN, RN, CWS, Kim RN, BSN Entered By: Gretta Cool, BSN, RN, CWS, Kim on 01/18/2019 07:46:40 [name] ([id number]) -------------------------------------------------------------------------------- Wound Assessment Details Patient Name: [name] Date of Service: 01/17/2019 3:45 PM Medical Record Number: [id number] Patient Account Number: [id number] Date of Birth/Sex: [birth year] (33 y.o. F) Treating RN: Cornell Barman Primary Care Md Smola: [name] Other Clinician: Referring [place]: [name] Treating Calhoun Reichardt/Extender: Tito Dine in Treatment: 10 Wound Status Wound Number: 8 Primary 2nd degree Burn Etiology: Wound Location: Right Breast Wound Open Wounding Event: Thermal Burn Status: Date Acquired: 10/17/2018 Comorbid Anemia, Hypertension, Type II Diabetes, Weeks Of Treatment: 10 History: Osteoarthritis, Neuropathy, Seizure Disorder Clustered Wound: No Photos Wound Measurements Length: (cm) 0.8 Width: (cm) 1 Depth: (cm) 0.1 Area: (cm) 0.628 Volume: (cm) 0.063 % Reduction  in Area: 98.7% % Reduction in Volume: 98.7% Epithelialization: Medium (34-66%) Tunneling: No Undermining: No Wound Description Full Thickness Without Exposed Support Classification: Structures Wound Margin: Flat and Intact Exudate Medium Amount: Exudate Type: Serous Exudate Color: amber Foul Odor After Cleansing: No Slough/Fibrino Yes Wound Bed Granulation Amount: Large (67-100%) Exposed Structure Granulation Quality: Red Fascia  Exposed: No Necrotic Amount: None Present (0%) Fat Layer (Subcutaneous Tissue) Exposed: Yes Tendon Exposed: No Muscle Exposed: No Joint Exposed: No Bone Exposed: No CHARMAN, SPROULS ([id number]) Electronic Signature(s) Signed: 01/18/2019 7:59:20 AM By: Harold Barban Signed: 01/18/2019 5:19:38 PM By: Gretta Cool, BSN, RN, CWS, Kim RN, BSN Previous Signature: 01/18/2019 7:59:07 AM Version By: Harold Barban Previous Signature: 01/18/2019 7:58:17 AM Version By: Harold Barban Entered By: Harold Barban on 01/18/2019 07:59:19 [name] ([id number]) -------------------------------------------------------------------------------- Wound Assessment Details Patient Name: [name] Date of Service: 01/17/2019 3:45 PM Medical Record Number: [id number] Patient Account Number: [id number] Date of Birth/Sex: [birth year] (33 y.o. F) Treating RN: Cornell Barman Primary Care Cass Edinger: [name] Other Clinician: Referring Catori Panozzo: [name] Treating Derriona Branscom/Extender: Tito Dine in Treatment: 10 Wound Status Wound Number: 9 Primary Diabetic Wound/Ulcer of the Lower Extremity Etiology: Wound Location: Left Toe Great - Medial Wound Open Wounding Event: Not Known Status: Date Acquired: 10/17/2018 Comorbid Anemia, Hypertension, Type II Diabetes, Weeks Of Treatment: 10 History: Osteoarthritis, Neuropathy, Seizure Disorder Clustered Wound: No Photos Wound Measurements Length: (cm) 0.1 Width: (cm) 0.6 Depth: (cm) 0.3 Area: (cm) 0.047 Volume: (cm) 0.014 % Reduction in Area: 98.3% % Reduction in Volume: 97.5% Epithelialization: Medium (34-66%) Tunneling: No Undermining: No Wound Description Classification: Grade 2 Foul Odor A Wound Margin: Flat and Intact Slough/Fibr Exudate Amount: Medium Exudate Type: Serosanguineous Exudate Color: red, brown fter Cleansing: No ino Yes Wound Bed Granulation Amount: Small (1-33%) Exposed Structure Granulation Quality: Red Fascia  Exposed: No Necrotic Amount: Large (67-100%) Fat Layer (Subcutaneous Tissue) Exposed: Yes Necrotic Quality: Eschar Tendon Exposed: No Muscle Exposed: No Joint Exposed: No Bone Exposed: No Electronic Signature(s) ZISSY, EAMES ([id number]) Signed: 01/18/2019 7:59:46 AM By: Harold Barban Signed: 01/18/2019 5:19:38 PM By: Gretta Cool, BSN, RN, CWS, Kim RN, BSN Previous Signature: 01/18/2019 7:59:36 AM Version By: Harold Barban Previous Signature: 01/18/2019 7:58:36 AM Version By: Harold Barban Previous Signature: 01/18/2019 7:44:15 AM Version By: Gretta Cool, BSN, RN, CWS, Kim RN, BSN Entered By: Harold Barban on 01/18/2019 07:59:46 [name] ([id number]) -------------------------------------------------------------------------------- Vitals Details Patient Name: [name] Date of Service: 01/17/2019 3:45 PM Medical Record Number: [id number] Patient Account Number: [id number] Date of Birth/Sex: 02/11/1986 (33 y.o. F) Treating RN: Harold Barban Primary Care Andrena Margerum: [name] Other Clinician: Referring Shonna Deiter: [name] Treating Jonaya Freshour/Extender: Tito Dine in Treatment: 10 Vital Signs Time Taken: 16:00 Temperature (F): 99.1 Height (in): 66 Pulse (bpm): 106 Weight (lbs): 175 Respiratory Rate (breaths/min): 18 Body Mass Index (BMI): 28.2 Blood Pressure (mmHg): 105/79 Reference Range: 80 - 120 mg / dl Electronic Signature(s) Signed: 01/18/2019 4:15:06 PM By: Harold Barban Entered By: Harold Barban on 01/17/2019 16:03:20

## 2019-01-19 ENCOUNTER — Ambulatory Visit (INDEPENDENT_AMBULATORY_CARE_PROVIDER_SITE_OTHER): Payer: Self-pay | Admitting: Physician Assistant

## 2019-01-19 ENCOUNTER — Other Ambulatory Visit: Payer: Self-pay

## 2019-01-19 VITALS — BP 102/71 | HR 105 | Temp 97.5°F | Resp 16 | Ht 66.0 in | Wt 164.6 lb

## 2019-01-19 DIAGNOSIS — N186 End stage renal disease: Secondary | ICD-10-CM

## 2019-01-19 DIAGNOSIS — Z992 Dependence on renal dialysis: Secondary | ICD-10-CM

## 2019-01-19 NOTE — Progress Notes (Signed)
POST OPERATIVE OFFICE NOTE    CC:  F/u for surgery  HPI:  This is a 33 y.o. female who is s/p First stage performed 10/26/2018 by Dr. Carlis Abbott and [id number] Left basilic transposition.  She denise pain, loss of sensation and motor.      Allergies  Allergen Reactions  . Benadryl [Diphenhydramine-Zinc Acetate] Shortness Of Breath  . Diphenhydramine Hcl Anaphylaxis    REACTION: Stopped breathing in ICU  . Doxycycline Shortness Of Breath  . Motrin [Ibuprofen] Shortness Of Breath and Itching    Per pt  . Banana Other (See Comments)    Sick on the stomach  . Iron Dextran Other (See Comments)    REACTION: vein irritation  . Shellfish Allergy Hives    Current Outpatient Medications  Medication Sig Dispense Refill  . ACCU-CHEK SOFTCLIX LANCETS lancets Use to check blood sugar 4 times per day. 150 each 5  . acetaminophen (TYLENOL) 500 MG tablet Take 1,000 mg by mouth every 6 (six) hours as needed for moderate pain (pain).     Marland Kitchen amLODipine (NORVASC) 10 MG tablet Take 1 tablet (10 mg total) by mouth daily. 30 tablet 0  . b complex-vitamin c-folic acid (NEPHRO-VITE) 0.8 MG TABS tablet Take 1 tablet by mouth every Monday, Wednesday, and Friday. After dialysis on dialysis days    . calcium acetate (PHOSLO) 667 MG capsule Take 1,334 mg by mouth 2 (two) times daily.    . Calcium Carbonate Antacid (CALCIUM CARBONATE, DOSED IN MG ELEMENTAL CALCIUM,) 1250 MG/5ML SUSP Take 5 mLs by mouth daily.    . cetirizine (ZYRTEC) 10 MG tablet Take 10 mg by mouth daily as needed for allergies.    Marland Kitchen dicyclomine (BENTYL) 20 MG tablet Take 1 tablet (20 mg total) by mouth 2 (two) times daily. (Patient taking differently: Take 20 mg by mouth 2 (two) times daily as needed (stomach issues.). ) 60 tablet 0  . fluticasone (FLONASE) 50 MCG/ACT nasal spray Place 2 sprays into both nostrils daily as needed for allergies.    Marland Kitchen gabapentin (NEURONTIN) 300 MG capsule Take 300 mg by mouth at bedtime.     Marland Kitchen HYDROcodone-acetaminophen  (NORCO) 5-325 MG tablet Take 1 tablet by mouth every 6 (six) hours as needed for moderate pain. 20 tablet 0  . hydrOXYzine (ATARAX/VISTARIL) 50 MG tablet Take 1 tablet (50 mg total) by mouth 2 (two) times daily as needed for itching. 30 tablet 0  . insulin aspart (NOVOLOG FLEXPEN) 100 UNIT/ML FlexPen Inject 2-10 Units into the skin See admin instructions. 5-6 times per day as needed for blood sugar management (sliding scale)    . insulin degludec (TRESIBA) 100 UNIT/ML SOPN FlexTouch Pen Inject 0.6 mLs (60 Units total) into the skin 2 (two) times daily. Reports taking 24 units QAM (Patient taking differently: Inject 20 Units into the skin daily. ) 4 pen 3  . loperamide (IMODIUM) 2 MG capsule Take 2 mg by mouth 4 (four) times daily as needed for diarrhea or loose stools.     . metoCLOPramide (REGLAN) 10 MG tablet Take 1 tablet (10 mg total) by mouth 3 (three) times daily before meals. (Patient taking differently: Take 10 mg by mouth 3 (three) times daily as needed (stomach issues.). ) 90 tablet 3  . pantoprazole (PROTONIX) 40 MG tablet Take 40 mg by mouth daily.     Marland Kitchen PROTEIN PO Take 0.5 packets by mouth daily as needed (fatigue). LiquaCel Protein    . SANTYL ointment Apply 1 application topically daily.    Marland Kitchen  simvastatin (ZOCOR) 20 MG tablet Take 20 mg by mouth at bedtime.      No current facility-administered medications for this visit.      ROS:  See HPI  Physical Exam:    Incision:  Well healed incisions Extremities:  Motor and sensation intact left UE   Assessment/Plan:  This is a 33 y.o. female who is s/p: Left basilic second stage transposition  OK to start use of fistula on 01/26/2019 at the 12 week mark.  She would prefer to start using it after Thanksgiving.    She has right TDC that was placed 03/20/18 by Dr. Scot Dock.  This catheter was malfunctioning and a new TDC was placed in April by [facility].    F/U PRN    Roxy Horseman PA-C [facility]  [phone]  Clinic MD:  Early
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
## 2019-01-31 ENCOUNTER — Encounter: Payer: Medicare Other | Admitting: Internal Medicine

## 2019-01-31 ENCOUNTER — Other Ambulatory Visit: Payer: Self-pay

## 2019-01-31 DIAGNOSIS — E11621 Type 2 diabetes mellitus with foot ulcer: Secondary | ICD-10-CM | POA: Diagnosis not present

## 2019-02-01 NOTE — Progress Notes (Signed)
DANNILYNN, PRUDEN ([id number]) Visit Report for 01/31/2019 Arrival Information Details Patient Name: [name], [name] Date of Service: 01/31/2019 2:30 PM Medical Record Number: [id number] Patient Account Number: [id number] Date of Birth/Sex: 1985-10-03 (33 y.o. F) Treating RN: Cornell Barman Primary Care Carmalita Wakefield: [name] Other Clinician: Referring Gerre Ranum: [name] Treating Treysen Sudbeck/Extender: Tito Dine in Treatment: 12 Visit Information History Since Last Visit Added or deleted any medications: No Patient Arrived: Ambulatory Any new allergies or adverse reactions: No Arrival Time: 14:41 Had a fall or experienced change in No Accompanied By: self activities of daily living that may affect Transfer Assistance: None risk of falls: Patient Identification Verified: Yes Signs or symptoms of abuse/neglect since last visito No Secondary Verification Process Completed: Yes Hospitalized since last visit: No Implantable device outside of the clinic excluding No cellular tissue based products placed in the center since last visit: Has Dressing in Place as Prescribed: No Pain Present Now: No Electronic Signature(s) Signed: 01/31/2019 4:28:59 PM By: Lorine Bears RCP, RRT, CHT Entered By: Lorine Bears on 01/31/2019 14:41:44 [name] ([id number]) -------------------------------------------------------------------------------- Clinic Level of Care Assessment Details Patient Name: [name] Date of Service: 01/31/2019 2:30 PM Medical Record Number: [id number] Patient Account Number: [id number] Date of Birth/Sex: 02-29-1986 (33 y.o. F) Treating RN: Cornell Barman Primary Care Tryniti Laatsch: [name] Other Clinician: Referring Andra Heslin: [name] Treating Leor Whyte/Extender: Tito Dine in Treatment: 12 Clinic Level of Care Assessment Items TOOL 4 Quantity Score []  - Use when only an EandM is performed on FOLLOW-UP  visit 0 ASSESSMENTS - Nursing Assessment / Reassessment []  - Reassessment of Co-morbidities (includes updates in patient status) 0 X- 1 5 Reassessment of Adherence to Treatment Plan ASSESSMENTS - Wound and Skin Assessment / Reassessment X - Simple Wound Assessment / Reassessment - one wound 1 5 []  - 0 Complex Wound Assessment / Reassessment - multiple wounds []  - 0 Dermatologic / Skin Assessment (not related to wound area) ASSESSMENTS - Focused Assessment []  - Circumferential Edema Measurements - multi extremities 0 []  - 0 Nutritional Assessment / Counseling / Intervention []  - 0 Lower Extremity Assessment (monofilament, tuning fork, pulses) []  - 0 Peripheral Arterial Disease Assessment (using hand held doppler) ASSESSMENTS - Ostomy and/or Continence Assessment and Care []  - Incontinence Assessment and Management 0 []  - 0 Ostomy Care Assessment and Management (repouching, etc.) PROCESS - Coordination of Care X - Simple Patient / Family Education for ongoing care 1 15 []  - 0 Complex (extensive) Patient / Family Education for ongoing care []  - 0 Staff obtains Programmer, systems, Records, Test Results / Process Orders []  - 0 Staff telephones HHA, Nursing Homes / Clarify orders / etc []  - 0 Routine Transfer to another Facility (non-emergent condition) []  - 0 Routine Hospital Admission (non-emergent condition) []  - 0 New Admissions / Biomedical engineer / Ordering NPWT, Apligraf, etc. []  - 0 Emergency Hospital Admission (emergent condition) X- 1 10 Simple Discharge Coordination MARIM, KLOPP ([id number]) []  - 0 Complex (extensive) Discharge Coordination PROCESS - Special Needs []  - Pediatric / Minor Patient Management 0 []  - 0 Isolation Patient Management []  - 0 Hearing / Language / Visual special needs []  - 0 Assessment of Community assistance (transportation, D/C planning, etc.) []  - 0 Additional assistance / Altered mentation []  - 0 Support Surface(s) Assessment (bed,  cushion, seat, etc.) INTERVENTIONS - Wound Cleansing / Measurement []  - Simple Wound Cleansing - one wound 0 []  - 0 Complex Wound Cleansing - multiple wounds X- 1  5 Wound Imaging (photographs - any number of wounds) []  - 0 Wound Tracing (instead of photographs) []  - 0 Simple Wound Measurement - one wound []  - 0 Complex Wound Measurement - multiple wounds INTERVENTIONS - Wound Dressings []  - Small Wound Dressing one or multiple wounds 0 []  - 0 Medium Wound Dressing one or multiple wounds []  - 0 Large Wound Dressing one or multiple wounds []  - 0 Application of Medications - topical []  - 0 Application of Medications - injection INTERVENTIONS - Miscellaneous []  - External ear exam 0 []  - 0 Specimen Collection (cultures, biopsies, blood, body fluids, etc.) []  - 0 Specimen(s) / Culture(s) sent or taken to Lab for analysis []  - 0 Patient Transfer (multiple staff / Civil Service fast streamer / Similar devices) []  - 0 Simple Staple / Suture removal (25 or less) []  - 0 Complex Staple / Suture removal (26 or more) []  - 0 Hypo / Hyperglycemic Management (close monitor of Blood Glucose) []  - 0 Ankle / Brachial Index (ABI) - do not check if billed separately X- 1 5 Vital Signs KAMAURIA, WEISINGER ([id number]) Has the patient been seen at the hospital within the last three years: Yes Total Score: 45 Level Of Care: New/Established - Level 2 Electronic Signature(s) Signed: 02/01/2019 1:15:06 PM By: Gretta Cool, BSN, RN, CWS, Kim RN, BSN Entered By: Gretta Cool, BSN, RN, CWS, Kim on 01/31/2019 15:27:59 [name] ([id number]) -------------------------------------------------------------------------------- Encounter Discharge Information Details Patient Name: [name] Date of Service: 01/31/2019 2:30 PM Medical Record Number: [id number] Patient Account Number: [id number] Date of Birth/Sex: 05-19-1985 (33 y.o. F) Treating RN: Cornell Barman Primary Care Cristo Ausburn: [name] Other Clinician: Referring  Selby Slovacek: [name] Treating Keaun Schnabel/Extender: Tito Dine in Treatment: 12 Encounter Discharge Information Items Discharge Condition: Stable Ambulatory Status: Ambulatory Discharge Destination: Home Transportation: Private Auto Accompanied By: self Schedule Follow-up Appointment: Yes Clinical Summary of Care: Electronic Signature(s) Signed: 02/01/2019 1:15:06 PM By: Gretta Cool, BSN, RN, CWS, Kim RN, BSN Entered By: Gretta Cool, BSN, RN, CWS, Kim on 01/31/2019 15:28:21 [name] ([id number]) -------------------------------------------------------------------------------- Lower Extremity Assessment Details Patient Name: [name] Date of Service: 01/31/2019 2:30 PM Medical Record Number: [id number] Patient Account Number: [id number] Date of Birth/Sex: September 30, 1985 (33 y.o. F) Treating RN: Army Melia Primary Care Permelia Bamba: [name] Other Clinician: Referring Abuk Selleck: [name] Treating Jewelia Bocchino/Extender: Ricard Dillon Weeks in Treatment: 12 Edema Assessment Assessed: [Left: No] [Right: No] Edema: [Left: N] [Right: o] Vascular Assessment Pulses: Dorsalis Pedis Palpable: [Right:Yes] Electronic Signature(s) Signed: 01/31/2019 4:09:40 PM By: Army Melia Entered By: Army Melia on 01/31/2019 14:53:19 [name] ([id number]) -------------------------------------------------------------------------------- Multi Wound Chart Details Patient Name: [name] Date of Service: 01/31/2019 2:30 PM Medical Record Number: [id number] Patient Account Number: [id number] Date of Birth/Sex: 1985-08-14 (33 y.o. 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 Comorbid History: Anemia, Hypertension, Type II Anemia, Hypertension, Type II N/A Diabetes, Osteoarthritis, Diabetes, Osteoarthritis, Neuropathy, Seizure Disorder Neuropathy, Seizure Disorder Date Acquired: 10/17/2018 10/17/2018 N/A Weeks of Treatment: 12 12 N/A Wound Status: Healed - Epithelialized Healed - Epithelialized N/A Measurements L x W x D  0x0x0 0x0x0 N/A (cm) Area (cm) : 0 0 N/A Volume (cm) : 0 0 N/A % Reduction in Area: 100.00% 100.00% N/A % Reduction in Volume: 100.00% 100.00% N/A Classification: Full Thickness Without Grade 2 N/A Exposed Support Structures Exudate Amount: None Present Medium N/A Exudate Type: N/A Serosanguineous N/A Exudate Color: N/A red, brown N/A Wound Margin: Flat and Intact Flat and Intact N/A Granulation Amount: None Present (0%) None Present (0%) N/A Necrotic Amount: None Present (0%) Large (67-100%) N/A Necrotic Tissue: N/A Eschar N/A Exposed Structures: Fascia: No Fascia: No N/A Fat Layer (Subcutaneous Fat Layer (Subcutaneous Tissue) Exposed: No Tissue) Exposed: No Tendon: No Tendon: No ANALEIGH, SLEMP ([id number]) Muscle: No Muscle: No Joint: No Joint: No Bone: No Bone: No Epithelialization: Large (67-100%) Large (67-100%) N/A Treatment Notes Electronic Signature(s) Signed: 01/31/2019 5:09:09 PM By: Linton Ham MD Entered By: Linton Ham on 01/31/2019 16:26:38 [name] ([id number]) -------------------------------------------------------------------------------- Multi-Disciplinary Care Plan Details Patient Name: [name] Date of Service: 01/31/2019 2:30 PM Medical Record Number: [id number] Patient Account Number: [id number] Date of Birth/Sex: 1985-06-08 (33 y.o. F) Treating RN: Cornell Barman Primary Care Stephone Gum: [name] Other Clinician: Referring Celese Banner: [name] Treating Maddock Finigan/Extender: Tito Dine in Treatment: 12 Active Inactive Electronic Signature(s) Signed: 02/01/2019 1:15:06 PM By: Gretta Cool, BSN, RN, CWS, Kim RN, BSN Entered By: Gretta Cool, BSN, RN, CWS, Kim on 01/31/2019 15:27:06 [name] ([id number]) -------------------------------------------------------------------------------- Pain Assessment Details Patient Name: [name] Date of Service: 01/31/2019 2:30 PM Medical Record Number: [id number] Patient Account Number: [id number] Date of Birth/Sex: 09-29-1985 (33 y.o. F) Treating RN: Cornell Barman Primary Care Lehua Flores: [name] Other Clinician: Referring Dietrich Ke: [name] Treating Nahum Sherrer/Extender: Tito Dine in Treatment: 12 Active Problems Location of Pain Severity and Description of Pain Patient Has Paino No Site Locations Pain Management and Medication Current Pain Management: Electronic Signature(s) Signed: 01/31/2019 4:28:59 PM By: Lorine Bears RCP, RRT, CHT Signed: 02/01/2019 1:15:06 PM By: Gretta Cool, BSN, RN, CWS, Kim RN, BSN Entered By: Lorine Bears on 01/31/2019 14:41:53 [name] ([id number]) -------------------------------------------------------------------------------- Wound Assessment Details Patient Name: [name] Date of Service: 01/31/2019 2:30 PM Medical Record Number: [id number] Patient Account Number: [id number] Date of Birth/Sex: [birth year] (33 y.o. F) Treating RN: Cornell Barman Primary Care Kymberli Wiegand: [name] Other Clinician: Referring Alyssamarie Mounsey: [name] Treating Maysoon Lozada/Extender: Tito Dine in Treatment: 12 Wound Status Wound Number: 8 Primary 2nd degree Burn Etiology: Wound Location: Right Breast Wound Healed - Epithelialized Wounding Event: Thermal Burn Status: Date Acquired: 10/17/2018 Comorbid  Anemia, Hypertension, Type II Diabetes, Weeks Of Treatment: 12 History: Osteoarthritis, Neuropathy, Seizure Disorder Clustered Wound: No Photos Wound Measurements Length: (cm) 0 % Redu Width: (cm) 0 % Redu Depth: (cm) 0 Epithe Area: (cm) 0 Tunne Volume: (cm) 0 Under ction in Area: 100% ction in Volume: 100% lialization: Large (67-100%) ling: No mining: No Wound Description Full Thickness Without Exposed Support Classification: Structures Wound Margin: Flat and Intact Exudate None Present Amount: Foul Odor After Cleansing: No Slough/Fibrino Yes Wound Bed Granulation Amount: None Present (0%) Exposed Structure Necrotic Amount: None Present (0%) Fascia Exposed: No Fat Layer (Subcutaneous Tissue) Exposed: No Tendon Exposed: No Muscle Exposed: No Joint Exposed: No Bone Exposed: No Electronic Signature(s) Signed: 02/01/2019 1:15:06 PM By: Gretta Cool, BSN, RN, CWS, Kim RN, BSN [address], [place] SMarland Kitchen ([id number]) Entered By: Gretta Cool, BSN, RN, CWS, Kim on 01/31/2019 15:26:29 [name] ([id number]) -------------------------------------------------------------------------------- Wound Assessment Details Patient Name: [name] Date of Service: 01/31/2019  2:30 PM Medical Record Number: [id number] Patient Account Number: [id number] Date of Birth/Sex: 07-Jun-1985 (33 y.o. F) Treating RN: Cornell Barman Primary Care Vicy Medico: [name] Other Clinician: Referring Ryleigh Esqueda: [name] Treating Nahomy Limburg/Extender: Tito Dine in Treatment: 12 Wound Status Wound Number: 9 Primary Diabetic Wound/Ulcer of the Lower Extremity Etiology: Wound Location: Left, Medial Toe Great Wound Healed - Epithelialized Wounding Event: Not Known Status: Date Acquired: 10/17/2018 Comorbid Anemia, Hypertension, Type II Diabetes, Weeks Of Treatment: 12 History: Osteoarthritis, Neuropathy, Seizure Disorder Clustered Wound: No Photos Wound Measurements Length: (cm) 0 % Reduc Width:  (cm) 0 % Reduc Depth: (cm) 0 Epithel Area: (cm) 0 Tunnel Volume: (cm) 0 Underm tion in Area: 100% tion in Volume: 100% ialization: Large (67-100%) ing: No ining: No Wound Description Classification: Grade 2 Foul Od Wound Margin: Flat and Intact Slough/ Exudate Amount: Medium Exudate Type: Serosanguineous Exudate Color: red, brown or After Cleansing: No Fibrino Yes Wound Bed Granulation Amount: None Present (0%) Exposed Structure Necrotic Amount: Large (67-100%) Fascia Exposed: No Necrotic Quality: Eschar Fat Layer (Subcutaneous Tissue) Exposed: No Tendon Exposed: No Muscle Exposed: No Joint Exposed: No Bone Exposed: No Electronic Signature(s) WILHEMENA, ARIAN ([id number]) Signed: 02/01/2019 1:15:06 PM By: Gretta Cool, BSN, RN, CWS, Kim RN, BSN Entered By: Gretta Cool, BSN, RN, CWS, Kim on 01/31/2019 15:26:53 [name] ([id number]) -------------------------------------------------------------------------------- [place] Details Patient Name: [name] Date of Service: 01/31/2019 2:30 PM Medical Record Number: [id number] Patient Account Number: [id number] Date of Birth/Sex: [birth year] (33 y.o. F) Treating RN: Cornell Barman Primary Care Maurina Fawaz: [name] Other Clinician: Referring Russ Looper: [name] Treating Nautica Hotz/Extender: Tito Dine in Treatment: 12 Vital Signs Time Taken: 14:42 Temperature (F): 98.5 Height (in): 66 Pulse (bpm): 90 Weight (lbs): 175 Respiratory Rate (breaths/min): 16 Body Mass Index (BMI): 28.2 Blood Pressure (mmHg): 106/60 Reference Range: 80 - 120 mg / dl Electronic Signature(s) Signed: 01/31/2019 4:28:59 PM By: Lorine Bears RCP, RRT, CHT Entered By: Lorine Bears on 01/31/2019 14:46:14
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
## 2019-02-07 ENCOUNTER — Other Ambulatory Visit: Payer: Self-pay | Admitting: Cardiology

## 2019-02-07 ENCOUNTER — Encounter (HOSPITAL_COMMUNITY): Payer: Self-pay

## 2019-02-07 ENCOUNTER — Other Ambulatory Visit: Payer: Self-pay

## 2019-02-07 ENCOUNTER — Emergency Department (HOSPITAL_COMMUNITY): Payer: Medicare Other

## 2019-02-07 ENCOUNTER — Emergency Department (HOSPITAL_COMMUNITY)
Admission: EM | Admit: 2019-02-07 | Discharge: 2019-02-07 | Disposition: A | Discharge status: Home / Self Care | Payer: Medicare Other | Attending: Emergency Medicine | Admitting: Emergency Medicine

## 2019-02-07 DIAGNOSIS — E119 Type 2 diabetes mellitus without complications: Secondary | ICD-10-CM

## 2019-02-07 DIAGNOSIS — R079 Chest pain, unspecified: Secondary | ICD-10-CM

## 2019-02-07 DIAGNOSIS — R0789 Other chest pain: Secondary | ICD-10-CM | POA: Diagnosis not present

## 2019-02-07 DIAGNOSIS — I120 Hypertensive chronic kidney disease with stage 5 chronic kidney disease or end stage renal disease: Secondary | ICD-10-CM | POA: Diagnosis not present

## 2019-02-07 DIAGNOSIS — Z79899 Other long term (current) drug therapy: Secondary | ICD-10-CM | POA: Insufficient documentation

## 2019-02-07 DIAGNOSIS — N186 End stage renal disease: Secondary | ICD-10-CM | POA: Diagnosis not present

## 2019-02-07 DIAGNOSIS — E1022 Type 1 diabetes mellitus with diabetic chronic kidney disease: Secondary | ICD-10-CM | POA: Insufficient documentation

## 2019-02-07 DIAGNOSIS — I10 Essential (primary) hypertension: Secondary | ICD-10-CM | POA: Diagnosis not present

## 2019-02-07 DIAGNOSIS — Z992 Dependence on renal dialysis: Secondary | ICD-10-CM | POA: Diagnosis not present

## 2019-02-07 LAB — BASIC METABOLIC PANEL
Anion gap: 11 (ref 5–15)
BUN: 13 mg/dL (ref 6–20)
CO2: 22 mmol/L (ref 22–32)
Calcium: 10.5 mg/dL — ABNORMAL HIGH (ref 8.9–10.3)
Chloride: 98 mmol/L (ref 98–111)
Creatinine, Ser: 4.31 mg/dL — ABNORMAL HIGH (ref 0.44–1.00)
GFR calc Af Amer: 14 mL/min — ABNORMAL LOW (ref >60)
GFR calc non Af Amer: 12 mL/min — ABNORMAL LOW (ref >60)
Glucose, Bld: 278 mg/dL — ABNORMAL HIGH (ref 70–99)
Potassium: 4 mmol/L (ref 3.5–5.1)
Sodium: 131 mmol/L — ABNORMAL LOW (ref 135–145)

## 2019-02-07 LAB — HEPATIC FUNCTION PANEL
ALT: 6 U/L (ref 0–44)
AST: 18 U/L (ref 15–41)
Albumin: 3.9 g/dL (ref 3.5–5.0)
Alkaline Phosphatase: 71 U/L (ref 38–126)
Bilirubin, Direct: <0.1 mg/dL (ref 0.0–0.2)
Total Bilirubin: 0.5 mg/dL (ref 0.3–1.2)
Total Protein: 9 g/dL — ABNORMAL HIGH (ref 6.5–8.1)

## 2019-02-07 LAB — ECHOCARDIOGRAM COMPLETE
Height: 66 in
Weight: 2761.92 oz

## 2019-02-07 LAB — CBC
HCT: 38.8 % (ref 36.0–46.0)
Hemoglobin: 11.7 g/dL — ABNORMAL LOW (ref 12.0–15.0)
MCH: 26.9 pg (ref 26.0–34.0)
MCHC: 30.2 g/dL (ref 30.0–36.0)
MCV: 89.2 fL (ref 80.0–100.0)
Platelets: 173 10*3/uL (ref 150–400)
RBC: 4.35 MIL/uL (ref 3.87–5.11)
RDW: 20.8 % — ABNORMAL HIGH (ref 11.5–15.5)
WBC: 8.5 10*3/uL (ref 4.0–10.5)
nRBC: 0 % (ref 0.0–0.2)

## 2019-02-07 LAB — TROPONIN I (HIGH SENSITIVITY)
Troponin I (High Sensitivity): 4 ng/L (ref <18)
Troponin I (High Sensitivity): 5 ng/L (ref <18)

## 2019-02-07 LAB — I-STAT BETA HCG BLOOD, ED (MC, WL, AP ONLY): I-stat hCG, quantitative: <5 m[IU]/mL (ref <5)

## 2019-02-07 IMAGING — CT CT ABD-PELV W/O CM
2 of 4 series · 16 of 46 positions shown, 18 images · non-contrast
Comparison: 07/02/2017

CLINICAL DATA: Recent cholecystectomy, history of renal transplant,
severe mid abdominal pain, nausea/vomiting

EXAM:
CT ABDOMEN AND PELVIS WITHOUT CONTRAST
TECHNIQUE: Multidetector CT imaging of the abdomen and pelvis was performed
following the standard protocol without IV contrast.

[Series 2: axial st · axial · 0.67mm/px · z∈[-446,-41]mm · 13 of 91 slices shown, 15 images]
[im 5/91  soft-tissue]
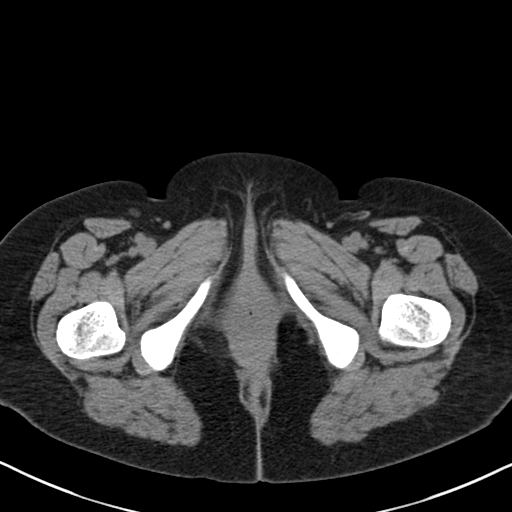
[im 5/91  bone]
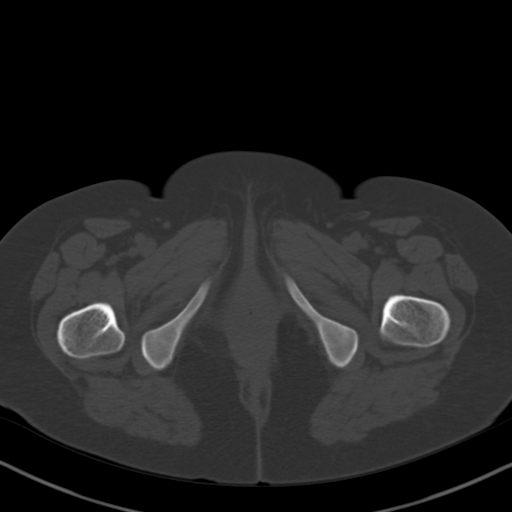
[im 14/91  soft-tissue]
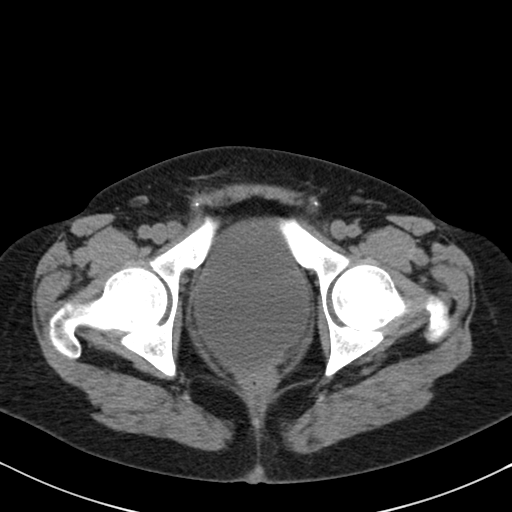
[im 19/91  soft-tissue]
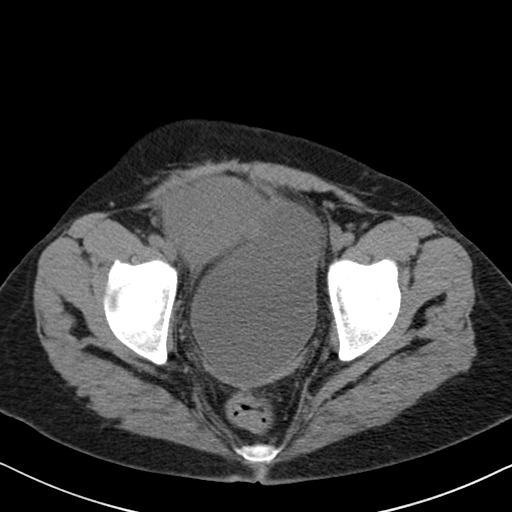
[im 28/91  soft-tissue]
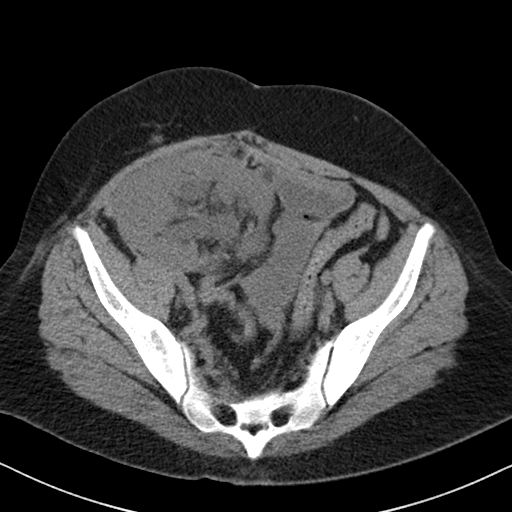
[im 32/91  soft-tissue]
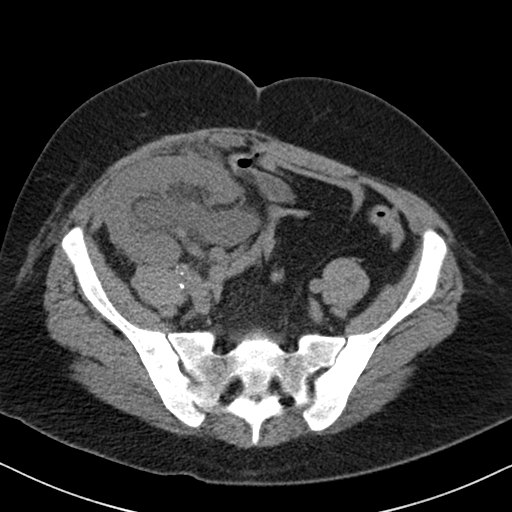
[im 41/91  soft-tissue]
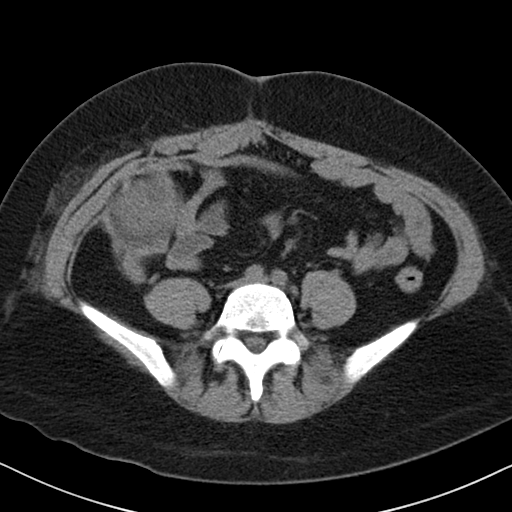
[im 46/91  soft-tissue]
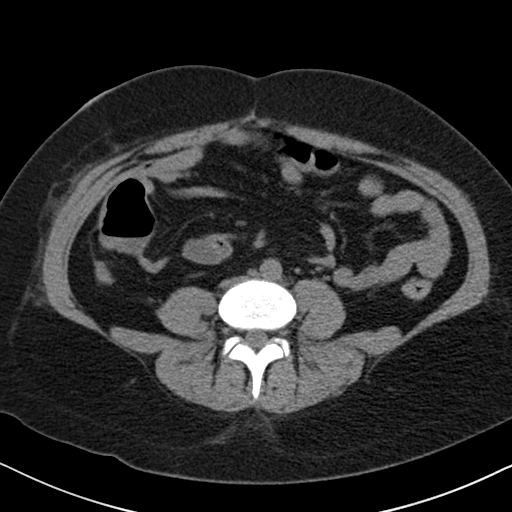
[im 50/91  soft-tissue]
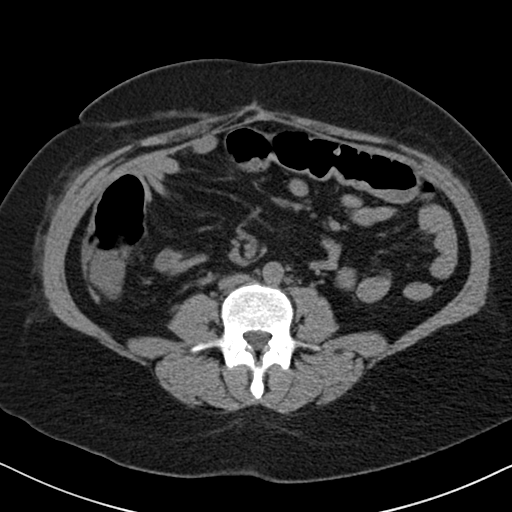
[im 59/91  soft-tissue]
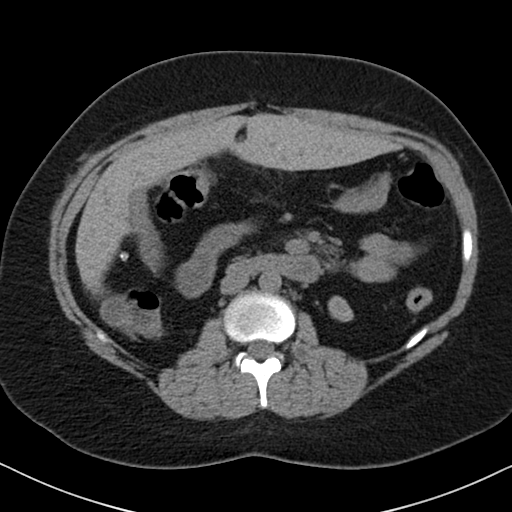
[im 59/91  bone]
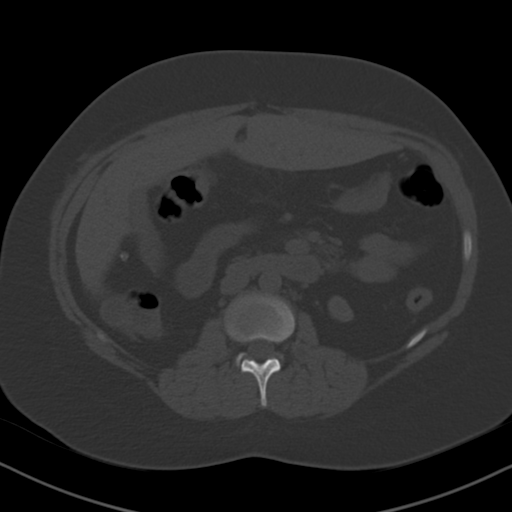
[im 64/91  soft-tissue]
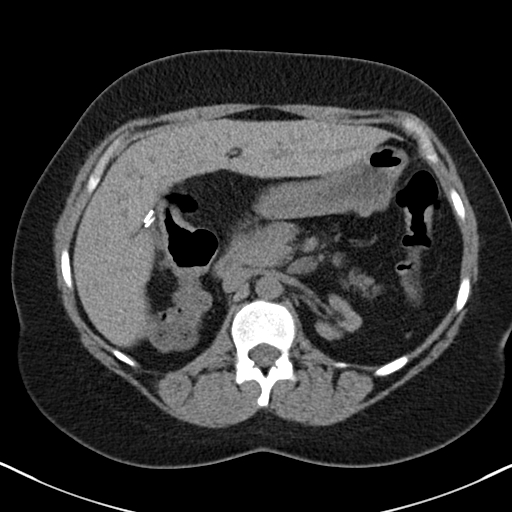
[im 73/91  soft-tissue]
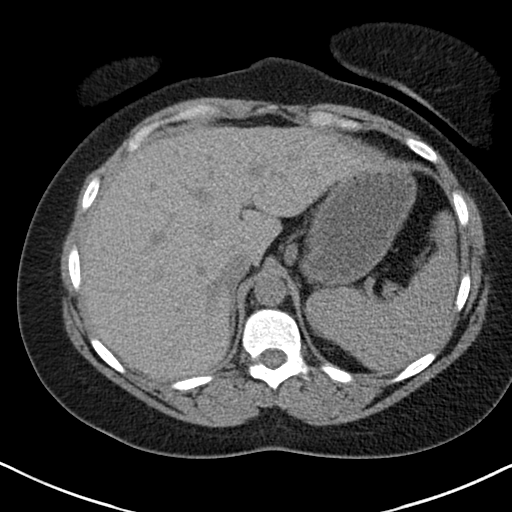
[im 77/91  soft-tissue]
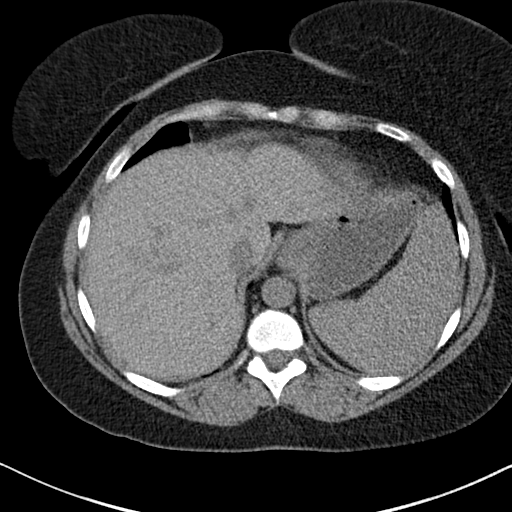
[im 86/91  soft-tissue]
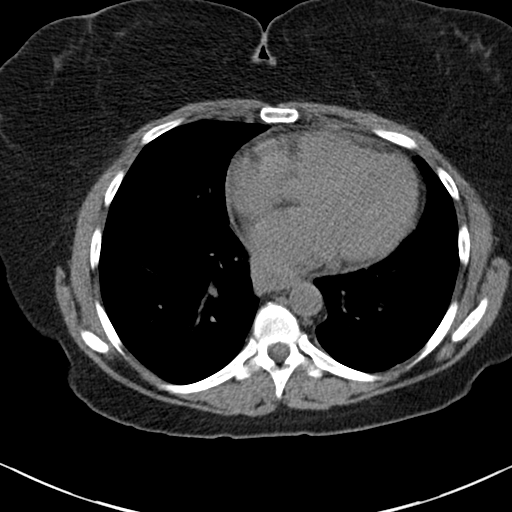

[Series 4: coronal st · coronal · 0.72mm/px · 3 of 105 slices shown]
[im 35/105  soft-tissue]
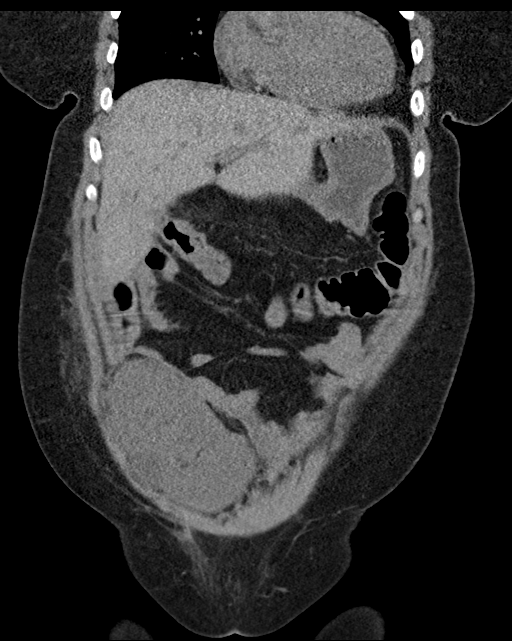
[im 47/105  soft-tissue]
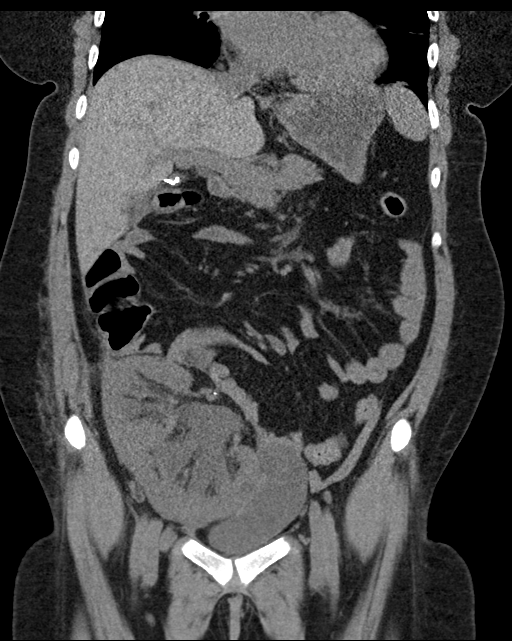
[im 58/105  soft-tissue]
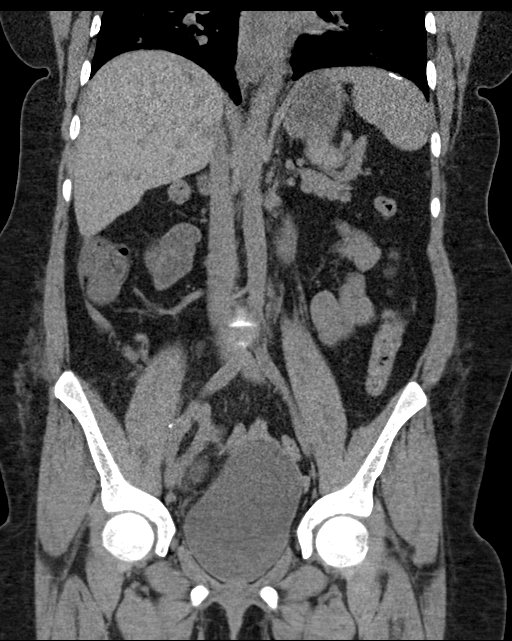

[16 of 46 positions shown; findings below may reference images not displayed]

FINDINGS: Lower chest: Mild ground-glass opacity at the lung bases.

Hepatobiliary: Unenhanced liver is unremarkable.

Status post cholecystectomy. New complete resolution of
postoperative seroma, with trace residual fluid inferior to the
surgical bed (series 2/image 30).

No intrahepatic or extrahepatic ductal dilatation.

Pancreas: Within normal limits.

Spleen: Within normal limits.

Adrenals/Urinary Tract: Adrenal glands are within normal limits.

Native left renal cortical scarring/atrophy. Native right kidney is
not discretely visualized.

Right lower quadrant renal transplant with stable 3.1 cm cortical
cyst (series 2/image 67). Moderate hydronephrosis involving the
transplant kidney (series 2/image 60), new. Kidney is enlarged when
compared to the prior.

Bladder is mildly distended.

Stomach/Bowel: Stomach is within normal limits.

No evidence of bowel obstruction.

Normal appendix (series 2/image 50).

Vascular/Lymphatic: No evidence of abdominal aortic aneurysm.

No suspicious abdominopelvic lymphadenopathy.

Reproductive: Status post hysterectomy.

No adnexal masses.

Other: No abdominopelvic ascites.  No free air.

Musculoskeletal: Visualized osseous structures are within normal
limits.
IMPRESSION: Moderate hydronephrosis involving the right lower quadrant renal
transplant, new. Associated enlargement of the transplant kidney
when compared to the prior. Emergent urology consult is suggested.

Improving postoperative seroma related to prior cholecystectomy.

Additional ancillary findings as above.

## 2019-02-07 MED ORDER — METOPROLOL TARTRATE 25 MG PO TABS: 25.0000 mg | ORAL_TABLET | Freq: Two times a day (BID) | ORAL | Status: DC

## 2019-02-07 MED ORDER — SODIUM CHLORIDE 0.9% FLUSH: 3.0000 mL | Freq: Once | INTRAVENOUS | Status: DC

## 2019-02-07 MED ORDER — IOHEXOL 350 MG/ML SOLN
100.0000 mL | Freq: Once | INTRAVENOUS | Status: AC | PRN
  Administered 2019-02-07: 100 mL via INTRAVENOUS

## 2019-02-07 MED ORDER — METOPROLOL TARTRATE 25 MG PO TABS: 25.0000 mg | ORAL_TABLET | Freq: Two times a day (BID) | ORAL | 0 refills | Status: DC

## 2019-02-07 NOTE — Progress Notes (Signed)
Echo reviewed Normal with EF 60-65% no pericardial effusion  No RWMAls Ok to d/c home  D/c norvasc Start lopressor 25 bid  [employer]

## 2019-02-07 NOTE — ED Provider Notes (Addendum)
[facility] EMERGENCY DEPARTMENT Provider Note   CSN: [id number] Arrival date & time: 02/07/19  1145     History   Chief Complaint Chief Complaint  Patient presents with   Chest Pain    HPI [name] is a 33 y.o. female.     HPI  33 year old female history of CKD, on dialysis, history of renal transplant that failed, presents today with chest pain that began last night at about 7:30 PM.  She describes it as in the upper substernal area.  It is sharp and has pressure.  Patient has waxed and waned.  She describes it as 5-8.5 on the pain scale.  She has some associated shortness of breath when she lays down flat and when she exerts herself.  She denies having similar pain in the past.  She denies history of clots in her legs or lungs.  She denies similar symptoms in the past and has no prior history of coronary artery disease.  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   Pyelonephritis 06/23/2014   Seasonal allergies    UTI (urinary tract infection) 01/09/2015   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   Qualifier: Diagnosis of  By: Carlena Sax  MD, Colletta [place]      Patient Active Problem List   Diagnosis Date Noted   Intra-abdominal abscess ([place]) 10/28/2018   Hypertensive emergency 09/01/2018   DKA (diabetic ketoacidosis) ([place]) 05/01/2018   ESRD on hemodialysis ([place]) 05/01/2018   Renal failure 03/18/2018   Cellulitis of left leg 03/01/2018   Hyperlipidemia 03/01/2018   Hypocalcemia [id number]   Metabolic acidosis [id number]   Hypokalemia 02/03/2018   Incontinence of bowel 02/03/2018    Chronic pain 11/11/2017   Gastroparesis    Constipation 07/13/2017   Hematemesis 07/02/2017   Chronic cholecystitis 06/29/2017   Type 1 diabetes mellitus with complication, uncontrolled ([place]) 05/25/2015   Nausea & vomiting 01/09/2015   Renal transplant recipient    Immunosuppressed status ([place])    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   Sleep-wake schedule disorder, 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   Chronic kidney disease 01/04/2010   Chronic kidney disease, stage II (mild) 01/04/2010   OVARIAN FAILURE, PREMATURE 03/11/2009   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   OBESITY 09/25/2007   Anemia due to chronic kidney disease 09/25/2007   LEARNING DISABILITY 09/25/2007   Essential hypertension, benign 09/25/2007    Past Surgical History:  Procedure Laterality Date   ARTERIOVENOUS GRAFT PLACEMENT Bilateral    "neither work" (10/24/2017)   AV FISTULA PLACEMENT Left 10/26/2018   Procedure: CREATION OF ARTERIOVENOUS FISTULA  LEFT ARM;  Surgeon: [name], MD;  Location: [facility];  Service: Vascular;  Laterality: Left;   [place] Left 12/21/2018   Procedure: Left arm BASILIC VEIN TRANSPOSITION SECOND STAGE;  Surgeon: [name], MD;  Location: [facility] OR;  Service: Vascular;  Laterality: Left;   CHOLECYSTECTOMY N/A 06/30/2017   Procedure: LAPAROSCOPIC CHOLECYSTECTOMY WITH INTRAOPERATIVE CHOLANGIOGRAM;  Surgeon: Excell Seltzer, MD;  Location: [facility];  Service: General;  Laterality: N/A;   ESOPHAGOGASTRODUODENOSCOPY (  EGD) WITH PROPOFOL N/A 07/04/2017   Procedure: ESOPHAGOGASTRODUODENOSCOPY (EGD) WITH PROPOFOL;  Surgeon: Clarene Essex, MD;  Location: [facility];  Service: Endoscopy;  Laterality: N/A;   INSERTION OF DIALYSIS CATHETER N/A 03/20/2018   Procedure: INSERTION OF TUNNELED DIALYSIS CATHETER - RIGHT INTERANL JUGULAR PLACEMENT;  Surgeon: Angelia Mould, MD;  Location: [facility];  Service:  Vascular;  Laterality: N/A;   IR [place]  10/28/2018   KIDNEY TRANSPLANT  2007   PARATHYROIDECTOMY  ?2012   "3/4 removed" (10/24/2017)   RENAL BIOPSY Bilateral 2003   UPPER EXTREMITY VENOGRAPHY Bilateral 10/19/2018   Procedure: UPPER EXTREMITY VENOGRAPHY;  Surgeon: [name], MD;  Location: [facility];  Service: Cardiovascular;  Laterality: Bilateral;  Bilateral      OB History   No obstetric history on file.      Home Medications    Prior to Admission medications   Medication Sig Start Date End Date Taking? Authorizing Provider  ACCU-CHEK SOFTCLIX LANCETS lancets Use to check blood sugar 4 times per day. 12/29/15   Renato Shin, MD  acetaminophen (TYLENOL) 500 MG tablet Take 1,000 mg by mouth every 6 (six) hours as needed for moderate pain (pain).     [provider]  amLODipine (NORVASC) 10 MG tablet Take 1 tablet (10 mg total) by mouth daily. 03/25/18   Thurnell Lose, MD  b complex-vitamin c-folic acid (NEPHRO-VITE) 0.8 MG TABS tablet Take 1 tablet by mouth every Monday, Wednesday, and Friday. After dialysis on dialysis days 07/19/18   [provider]  calcium acetate (PHOSLO) 667 MG capsule Take 1,334 mg by mouth 2 (two) times daily. 11/24/18   [provider]  Calcium Carbonate Antacid (CALCIUM CARBONATE, DOSED IN MG ELEMENTAL CALCIUM,) 1250 MG/5ML SUSP Take 5 mLs by mouth daily. 11/17/18   [provider]  cetirizine (ZYRTEC) 10 MG tablet Take 10 mg by mouth daily as needed for allergies.    [provider]  dicyclomine (BENTYL) 20 MG tablet Take 1 tablet (20 mg total) by mouth 2 (two) times daily. Patient taking differently: Take 20 mg by mouth 2 (two) times daily as needed (stomach issues.).  02/06/18   [name], [name], MD  fluticasone (FLONASE) 50 MCG/ACT nasal spray Place 2 sprays into both nostrils daily as needed for allergies. 09/03/18   [name], MD  gabapentin (NEURONTIN) 300  MG capsule Take 300 mg by mouth at bedtime.     [provider]  HYDROcodone-acetaminophen (NORCO) 5-325 MG tablet Take 1 tablet by mouth every 6 (six) hours as needed for moderate pain. 12/21/18   [name], PA-C  hydrOXYzine (ATARAX/VISTARIL) 50 MG tablet Take 1 tablet (50 mg total) by mouth 2 (two) times daily as needed for itching. 02/06/18   [name], Ripudeep K, MD  insulin aspart (NOVOLOG FLEXPEN) 100 UNIT/ML FlexPen Inject 2-10 Units into the skin See admin instructions. 5-6 times per day as needed for blood sugar management (sliding scale)    [provider]  insulin degludec (TRESIBA) 100 UNIT/ML SOPN FlexTouch Pen Inject 0.6 mLs (60 Units total) into the skin 2 (two) times daily. Reports taking 24 units QAM Patient taking differently: Inject 20 Units into the skin daily.  03/06/18   Charlynne Cousins, MD  loperamide (IMODIUM) 2 MG capsule Take 2 mg by mouth 4 (four) times daily as needed for diarrhea or loose stools.     [provider]  metoCLOPramide (REGLAN) 10 MG tablet Take 1 tablet (  10 mg total) by mouth 3 (three) times daily before meals. Patient taking differently: Take 10 mg by mouth 3 (three) times daily as needed (stomach issues.).  02/06/18   [name], Ripudeep K, MD  pantoprazole (PROTONIX) 40 MG tablet Take 40 mg by mouth daily.     [provider]  PROTEIN PO Take 0.5 packets by mouth daily as needed (fatigue). LiquaCel Protein    [provider]  SANTYL ointment Apply 1 application topically daily. 11/18/18   [provider]  simvastatin (ZOCOR) 20 MG tablet Take 20 mg by mouth at bedtime.     [provider]    Family History Family History  Problem Relation Age of Onset   Arthritis Mother    Diabetes Mother    Hypertension Mother     Social History Social History   Tobacco Use   Smoking status: Never Smoker   Smokeless tobacco: Never Used  Substance Use Topics   Alcohol use: No   Drug use:  No     Allergies   Benadryl [diphenhydramine-zinc acetate], Diphenhydramine hcl, Doxycycline, Motrin [ibuprofen], Banana, Iron dextran, and Shellfish allergy   Review of Systems Review of Systems  All other systems reviewed and are negative.    Physical Exam Updated Vital Signs BP 114/84    Pulse (!) 102    Temp 98.3 F (36.8 C) (Oral)    Resp 17    Ht 1.676 m (5\' 6" )    Wt 78.3 kg    SpO2 99%    BMI 27.86 kg/m   Physical Exam Vitals signs and nursing note reviewed.  Constitutional:      Appearance: She is well-developed.  HENT:     Head: Normocephalic and atraumatic.  Eyes:     Pupils: Pupils are equal, round, and reactive to light.  Neck:     Musculoskeletal: Normal range of motion.  Cardiovascular:     Rate and Rhythm: Normal rate and regular rhythm.     Heart sounds: Normal heart sounds.  Pulmonary:     Effort: Pulmonary effort is normal.     Breath sounds: Normal breath sounds.  Chest:     Comments: Patient has right chest wall with dialysis catheter in place.  There is no redness tenderness or discharge at the catheter site. She has some tenderness over the mid upper central sternum with palpation. Abdominal:     Palpations: Abdomen is soft.  Musculoskeletal: Normal range of motion.     Right lower leg: She exhibits no tenderness. No edema.     Left lower leg: She exhibits no tenderness. No edema.  Skin:    General: Skin is warm and dry.     Capillary Refill: Capillary refill takes less than 2 seconds.  Neurological:     General: No focal deficit present.     Mental Status: She is alert.  Psychiatric:        Mood and Affect: Mood normal.      ED Treatments / Results  Labs (all labs ordered are listed, but only abnormal results are displayed) Labs Reviewed  BASIC METABOLIC PANEL - Abnormal; Notable for the following components:      Result Value   Sodium 131 (*)    Glucose, Bld 278 (*)    Creatinine, Ser 4.31 (*)    Calcium 10.5 (*)    GFR calc  non Af Amer 12 (*)    GFR calc Af Amer 14 (*)    All other components within  normal limits  CBC - Abnormal; Notable for the following components:   Hemoglobin 11.7 (*)    RDW 20.8 (*)    All other components within normal limits  HEPATIC FUNCTION PANEL - Abnormal; Notable for the following components:   Total Protein 9.0 (*)    All other components within normal limits  I-STAT BETA HCG BLOOD, ED ([facility], [facility], [facility] ONLY)  TROPONIN I (HIGH SENSITIVITY)  TROPONIN I (HIGH SENSITIVITY)    EKG EKG Interpretation  Date/Time:  Wednesday February 07 2019 11:53:09 EST Ventricular Rate:  111 PR Interval:  134 QRS Duration: 84 QT Interval:  328 QTC Calculation: 446 R Axis:   94 Text Interpretation: Sinus tachycardia Rightward axis Borderline ECG diffuse st elevation ?pericarditis Confirmed by Pattricia Boss [phone]) on 02/07/2019 12:01:04 PM   Radiology Dg Chest 2 View  Result Date: 02/07/2019 CLINICAL DATA:  Chest pain EXAM: CHEST - 2 VIEW COMPARISON:  October 27, 2018 FINDINGS: Central catheter tip is at the cavoatrial junction. No pneumothorax. There is atelectatic change in the bases, slightly more on the right than on the left. The lungs elsewhere are clear. Heart is upper normal in size with pulmonary vascularity normal. No adenopathy. No bone lesions. IMPRESSION: Bibasilar atelectasis, slightly more on the right than on the left. No consolidation or edema. Heart upper normal in size. Central catheter tip at cavoatrial junction. No pneumothorax. Electronically Signed   By: Lowella Grip III M.D.   On: 02/07/2019 13:56    Procedures Procedures (including critical care time)  Medications Ordered in ED Medications  sodium chloride flush (NS) 0.9 % injection 3 mL (has no administration in time range)     Initial Impression / Assessment and Plan / ED Course  I have reviewed the triage vital signs and the nursing notes.  Pertinent labs & imaging results that were available during my care  of the patient were reviewed by me and considered in my medical decision making (see chart for details).  Clinical Course as of Feb 08 1548  Wed Feb 07, 2019  1510 Pt signed out to me by Dr. Jeanell Sparrow.  Briefly 33 yo w/ ESRD on dialysis w/ hx of kidney transplant presenting with acute onset CP and SOB since this morning.  ECG with possible pericarditis pattern (diffuse minor ST elevations), delta trops are negative.  Awaiting CT PE   [MT]  1635 IMPRESSION: 1. Negative examination for pulmonary embolism.  2. There are numerous enlarged mediastinal and hilar lymph nodes, most notably right paratracheal nodes, measuring up to 1.7 x 1.6 cm (series 5, image 25), nonspecific and possibly reactive. The configuration of mediastinal lymph nodes is generally similar to that seen on remote prior PET-CT dated 2010. 3. Cardiomegaly.   [MT]  [id number] I touch base again with the cardiology team who reviewed the patient's echocardiogram, with no reported signs of pericardial effusion.  We will discharge the patient follow-up with either our cardiologist or her local cardiologist in [place]   [MT]    Clinical Course User Index [MT] Wyvonnia Dusky, MD     33 yo female with sscp.  EKG with acute diffuse st elevation.  Troponin normal.  The pain is reproduced with palpation.  However, given tachycardia and risk factors will check delta trops and ct angio to assess pe and evaluate for pericardial effusion. Discussed with cardiology who is seeing and consulting. Patient signed out to Dr. Langston Masker and he will follow up testing and reevaluate patient    Final Clinical  Impressions(s) / ED Diagnoses   Final diagnoses:  Chest pain, unspecified type    ED Discharge Orders    None       Pattricia Boss, MD 02/07/19 1541    Pattricia Boss, MD 02/08/19 1549

## 2019-02-07 NOTE — ED Notes (Signed)
Patient transported to CT 

## 2019-02-07 NOTE — Progress Notes (Signed)
   Pt agrees to outpatient stress test. Offered to have it in our office or could have been arranged through her transplant team in [place].  She prefers to go ahead and have a stress test here.  I have placed the order and our office will call her to arrange. Daune Perch, AGNP-C [facility] 02/07/2019  5:35 PM

## 2019-02-07 NOTE — ED Triage Notes (Signed)
Pt reports centralized chest pain that started last night after returning from a trip from [place], [place]. States it does not feel like GERD. Pain is worse when she lays down. Pt a.o, nad noted.

## 2019-02-07 NOTE — ED Triage Notes (Signed)
Pt is a dialysis pt, MWF, last treatment today.

## 2019-02-07 NOTE — ED Notes (Signed)
Patient transported to X-ray 

## 2019-02-07 NOTE — Progress Notes (Signed)
Given Thanksgiving Holiday tomorrow no Myovue's being done Getting echo now Two negative troponin and no acute ECG changes If echo benign can be d/c home with outpatient f/u at  [place] office for myovue or in [place]  D/c norvasc and d/c with lopressor 25 bid  Jenkins Rouge

## 2019-02-07 NOTE — Progress Notes (Signed)
Attempted echo. Pt in CT Will check back

## 2019-02-07 NOTE — Discharge Instructions (Addendum)
You were seen by our cardiologist today in the emergency department.  They recommended that you stop taking amlodipine and begin taking metoprolol 25 mg twice daily (once in the morning, once at night) instead for blood pressure.  I prescribed you this new medication.  You can take your first dose tomorrow.  Otherwise your work-up today did not show signs of heart stress or blood clots in your lungs. This included blood enzymes, a CT scan of your lungs to look for blood clots, and an echocardiogram of your heart.     You have a Stress Test scheduled at [facility]. Your doctor has ordered this test to check the blood flow in your heart arteries.  Please arrive 15 minutes early for paperwork. The whole test will take several hours. You may want to bring reading material to remain occupied while undergoing different parts of the test.  Instructions:  No food/drink after midnight the night before.  It is OK to take your morning meds with a sip of water EXCEPT for those types of medicines listed below or otherwise instructed.  No caffeine/decaf products 24 hours before, including medicines such as Excedrin or Goody Powders. Call if there are any questions.   Wear comfortable clothes and shoes.   Special Medication Instructions:  Beta blockers such as metoprolol (Lopressor/Toprol XL), atenolol (Tenormin), carvedilol (Coreg), nebivolol (Bystolic), bisoprolol (Zebeta), propranolol (Inderal) should not be taken for 24 hours before the test.  Calcium channel blockers such as diltiazem (Cardizem) or verapmil (Calan) should not be taken for 24 hours before the test.  Remove nitroglycerin patches and do not take nitrate preparations such as Imdur/isosorbide the day of your test.  No Persantine/Theophylline or Aggrenox medicines should be used within 24 hours of the test.   If you are diabetic, please ask which medications to hold the day of the test.  What To  Expect: When you arrive in the lab, the technician will inject a small amount of radioactive tracer into your arm through an IV while you are resting quietly. This helps [place] to form pictures of your heart. You will likely only feel a sting from the IV. After a waiting period, resting pictures will be obtained under a big camera. These are the "before" pictures.  Next, you will be prepped for the stress portion of the test. This may include either walking on a treadmill or receiving a medicine that helps to dilate blood vessels in your heart to simulate the effect of exercise on your heart. If you are walking on a treadmill, you will walk at different paces to try to get your heart rate to a goal number that is based on your age. If your doctor has chosen the pharmacologic test, then you will receive a medicine through your IV that may cause temporary nausea, flushing, shortness of breath and sometimes chest discomfort or vomiting. This is typically short-lived and usually resolves quickly. If you experience symptoms, that does not automatically mean the test is abnormal. Some patients do not experience any symptoms at all. Your blood pressure and heart rate will be monitored, and we will be watching your EKG on a computer screen for any changes. During this portion of the test, the radiologist will inject another small amount of radioactive tracer into your IV. After a waiting period, you will undergo a second set of pictures. These are the "after" pictures.  The doctor reading the test will compare the before-and-after images to look for evidence  of heart blockages or heart weakness. The test usually takes 1 day to complete, but in certain instances (for example, if a patient is over a certain weight limit), the test may be done over the span of 2 days.

## 2019-02-07 NOTE — Progress Notes (Signed)
  Echocardiogram 2D Echocardiogram has been performed.  Josclyn Rosales A Ronneisha Jett 02/07/2019, 4:23 PM
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
## 2019-02-15 ENCOUNTER — Telehealth (HOSPITAL_COMMUNITY): Payer: Self-pay

## 2019-02-15 NOTE — Telephone Encounter (Signed)
Spoke with the patient and her instructions were given. Asked to call back with any questions. She stated that she would be here for her test. S.Josselyn Harkins EMTP

## 2019-02-20 ENCOUNTER — Other Ambulatory Visit: Payer: Self-pay

## 2019-02-20 ENCOUNTER — Ambulatory Visit (HOSPITAL_COMMUNITY): Payer: Medicare Other | Attending: Internal Medicine

## 2019-02-20 VITALS — Ht 66.0 in | Wt 164.0 lb

## 2019-02-20 DIAGNOSIS — R110 Nausea: Secondary | ICD-10-CM | POA: Diagnosis present

## 2019-02-20 DIAGNOSIS — R079 Chest pain, unspecified: Secondary | ICD-10-CM

## 2019-02-20 LAB — MYOCARDIAL PERFUSION IMAGING
LV dias vol: 74 mL (ref 46–106)
LV sys vol: 29 mL
Peak HR: 95 {beats}/min
Rest HR: 76 {beats}/min
SDS: 3
SRS: 0
SSS: 3
TID: 1.01

## 2019-02-20 MED ORDER — REGADENOSON 0.4 MG/5ML IV SOLN
0.4000 mg | Freq: Once | INTRAVENOUS | Status: AC
  Administered 2019-02-20: 0.4 mg via INTRAVENOUS

## 2019-02-20 MED ORDER — TECHNETIUM TC 99M TETROFOSMIN IV KIT
26.9000 | PACK | Freq: Once | INTRAVENOUS | Status: AC | PRN
  Administered 2019-02-20: 26.9 via INTRAVENOUS
  Filled 2019-02-20: qty 27

## 2019-02-20 MED ORDER — TECHNETIUM TC 99M TETROFOSMIN IV KIT
8.6000 | PACK | Freq: Once | INTRAVENOUS | Status: AC | PRN
  Administered 2019-02-20: 8.6 via INTRAVENOUS
  Filled 2019-02-20: qty 9

## 2019-02-20 MED ORDER — AMINOPHYLLINE 25 MG/ML IV SOLN
75.0000 mg | Freq: Once | INTRAVENOUS | Status: AC
  Administered 2019-02-20: 75 mg via INTRAVENOUS

## 2019-06-15 ENCOUNTER — Other Ambulatory Visit: Payer: Self-pay

## 2019-06-15 ENCOUNTER — Emergency Department (HOSPITAL_COMMUNITY)
Admission: EM | Admit: 2019-06-15 | Discharge: 2019-06-15 | Disposition: A | Discharge status: Home / Self Care | Payer: Medicare Other | Attending: Emergency Medicine | Admitting: Emergency Medicine

## 2019-06-15 ENCOUNTER — Emergency Department (HOSPITAL_COMMUNITY): Payer: Medicare Other

## 2019-06-15 DIAGNOSIS — R1084 Generalized abdominal pain: Secondary | ICD-10-CM | POA: Diagnosis not present

## 2019-06-15 DIAGNOSIS — Z794 Long term (current) use of insulin: Secondary | ICD-10-CM | POA: Insufficient documentation

## 2019-06-15 DIAGNOSIS — Z20822 Contact with and (suspected) exposure to covid-19: Secondary | ICD-10-CM | POA: Insufficient documentation

## 2019-06-15 DIAGNOSIS — E875 Hyperkalemia: Secondary | ICD-10-CM | POA: Diagnosis not present

## 2019-06-15 DIAGNOSIS — I120 Hypertensive chronic kidney disease with stage 5 chronic kidney disease or end stage renal disease: Secondary | ICD-10-CM | POA: Insufficient documentation

## 2019-06-15 DIAGNOSIS — N186 End stage renal disease: Secondary | ICD-10-CM | POA: Diagnosis not present

## 2019-06-15 DIAGNOSIS — E1022 Type 1 diabetes mellitus with diabetic chronic kidney disease: Secondary | ICD-10-CM | POA: Insufficient documentation

## 2019-06-15 DIAGNOSIS — Z79899 Other long term (current) drug therapy: Secondary | ICD-10-CM | POA: Insufficient documentation

## 2019-06-15 DIAGNOSIS — Z992 Dependence on renal dialysis: Secondary | ICD-10-CM | POA: Insufficient documentation

## 2019-06-15 DIAGNOSIS — R1110 Vomiting, unspecified: Secondary | ICD-10-CM | POA: Diagnosis present

## 2019-06-15 LAB — RENAL FUNCTION PANEL
Albumin: 3.2 g/dL — ABNORMAL LOW (ref 3.5–5.0)
Anion gap: 18 — ABNORMAL HIGH (ref 5–15)
BUN: 59 mg/dL — ABNORMAL HIGH (ref 6–20)
CO2: 24 mmol/L (ref 22–32)
Calcium: 6.8 mg/dL — ABNORMAL LOW (ref 8.9–10.3)
Chloride: 98 mmol/L (ref 98–111)
Creatinine, Ser: 10.34 mg/dL — ABNORMAL HIGH (ref 0.44–1.00)
GFR calc Af Amer: 5 mL/min — ABNORMAL LOW (ref >60)
GFR calc non Af Amer: 4 mL/min — ABNORMAL LOW (ref >60)
Glucose, Bld: 281 mg/dL — ABNORMAL HIGH (ref 70–99)
Phosphorus: 7 mg/dL — ABNORMAL HIGH (ref 2.5–4.6)
Potassium: 4.8 mmol/L (ref 3.5–5.1)
Sodium: 140 mmol/L (ref 135–145)

## 2019-06-15 LAB — COMPREHENSIVE METABOLIC PANEL
ALT: 17 U/L (ref 0–44)
AST: 55 U/L — ABNORMAL HIGH (ref 15–41)
Albumin: 3.5 g/dL (ref 3.5–5.0)
Alkaline Phosphatase: 53 U/L (ref 38–126)
Anion gap: 18 — ABNORMAL HIGH (ref 5–15)
BUN: 53 mg/dL — ABNORMAL HIGH (ref 6–20)
CO2: 24 mmol/L (ref 22–32)
Calcium: 6.5 mg/dL — ABNORMAL LOW (ref 8.9–10.3)
Chloride: 93 mmol/L — ABNORMAL LOW (ref 98–111)
Creatinine, Ser: 9.39 mg/dL — ABNORMAL HIGH (ref 0.44–1.00)
GFR calc Af Amer: 6 mL/min — ABNORMAL LOW (ref >60)
GFR calc non Af Amer: 5 mL/min — ABNORMAL LOW (ref >60)
Glucose, Bld: 236 mg/dL — ABNORMAL HIGH (ref 70–99)
Potassium: 6.7 mmol/L (ref 3.5–5.1)
Sodium: 135 mmol/L (ref 135–145)
Total Bilirubin: 0.4 mg/dL (ref 0.3–1.2)
Total Protein: 7.1 g/dL (ref 6.5–8.1)

## 2019-06-15 LAB — CBC
HCT: 31.9 % — ABNORMAL LOW (ref 36.0–46.0)
HCT: 30.5 % — ABNORMAL LOW (ref 36.0–46.0)
Hemoglobin: 10 g/dL — ABNORMAL LOW (ref 12.0–15.0)
Hemoglobin: 9.7 g/dL — ABNORMAL LOW (ref 12.0–15.0)
MCH: 31.5 pg (ref 26.0–34.0)
MCH: 32 pg (ref 26.0–34.0)
MCHC: 31.3 g/dL (ref 30.0–36.0)
MCHC: 31.8 g/dL (ref 30.0–36.0)
MCV: 100.6 fL — ABNORMAL HIGH (ref 80.0–100.0)
MCV: 100.7 fL — ABNORMAL HIGH (ref 80.0–100.0)
Platelets: 185 10*3/uL (ref 150–400)
Platelets: 159 10*3/uL (ref 150–400)
RBC: 3.17 MIL/uL — ABNORMAL LOW (ref 3.87–5.11)
RBC: 3.03 MIL/uL — ABNORMAL LOW (ref 3.87–5.11)
RDW: 18 % — ABNORMAL HIGH (ref 11.5–15.5)
RDW: 17.8 % — ABNORMAL HIGH (ref 11.5–15.5)
WBC: 6.9 10*3/uL (ref 4.0–10.5)
WBC: 6.1 10*3/uL (ref 4.0–10.5)
nRBC: 0 % (ref 0.0–0.2)
nRBC: 0 % (ref 0.0–0.2)

## 2019-06-15 LAB — RESPIRATORY PANEL BY RT PCR (FLU A&B, COVID)
Influenza A by PCR: NEGATIVE
Influenza B by PCR: NEGATIVE
SARS Coronavirus 2 by RT PCR: NEGATIVE

## 2019-06-15 LAB — POTASSIUM
Potassium: 7.2 mmol/L (ref 3.5–5.1)
Potassium: 3.4 mmol/L — ABNORMAL LOW (ref 3.5–5.1)

## 2019-06-15 LAB — CBG MONITORING, ED
Glucose-Capillary: 246 mg/dL — ABNORMAL HIGH (ref 70–99)
Glucose-Capillary: 235 mg/dL — ABNORMAL HIGH (ref 70–99)

## 2019-06-15 LAB — I-STAT BETA HCG BLOOD, ED (MC, WL, AP ONLY): I-stat hCG, quantitative: 5.3 m[IU]/mL — ABNORMAL HIGH (ref <5)

## 2019-06-15 LAB — LIPASE, BLOOD: Lipase: 36 U/L (ref 11–51)

## 2019-06-15 MED ORDER — LIDOCAINE HCL (PF) 1 % IJ SOLN: 5.0000 mL | INTRAMUSCULAR | Status: DC | PRN

## 2019-06-15 MED ORDER — PENTAFLUOROPROP-TETRAFLUOROETH EX AERO
1.0000 "application " | INHALATION_SPRAY | CUTANEOUS | Status: DC | PRN
  Filled 2019-06-15: qty 116

## 2019-06-15 MED ORDER — DEXTROSE 50 % IV SOLN
1.0000 | Freq: Once | INTRAVENOUS | Status: AC
  Administered 2019-06-15: 50 mL via INTRAVENOUS
  Filled 2019-06-15: qty 50

## 2019-06-15 MED ORDER — ALBUTEROL SULFATE (2.5 MG/3ML) 0.083% IN NEBU
10.0000 mg | INHALATION_SOLUTION | Freq: Once | RESPIRATORY_TRACT | Status: AC
  Administered 2019-06-15: 10 mg via RESPIRATORY_TRACT
  Filled 2019-06-15: qty 12

## 2019-06-15 MED ORDER — SODIUM CHLORIDE 0.9 % IV SOLN: 100.0000 mL | INTRAVENOUS | Status: DC | PRN

## 2019-06-15 MED ORDER — ALTEPLASE 2 MG IJ SOLR: 2.0000 mg | Freq: Once | INTRAMUSCULAR | Status: DC | PRN

## 2019-06-15 MED ORDER — SODIUM CHLORIDE 0.9% FLUSH
3.0000 mL | Freq: Once | INTRAVENOUS | Status: AC
  Administered 2019-06-15: 3 mL via INTRAVENOUS

## 2019-06-15 MED ORDER — HEPARIN SODIUM (PORCINE) 1000 UNIT/ML DIALYSIS
1000.0000 [IU] | INTRAMUSCULAR | Status: DC | PRN
  Filled 2019-06-15: qty 1

## 2019-06-15 MED ORDER — LIDOCAINE-PRILOCAINE 2.5-2.5 % EX CREA
1.0000 "application " | TOPICAL_CREAM | CUTANEOUS | Status: DC | PRN
  Filled 2019-06-15: qty 5

## 2019-06-15 MED ORDER — INSULIN ASPART 100 UNIT/ML IV SOLN
5.0000 [IU] | Freq: Once | INTRAVENOUS | Status: AC
  Administered 2019-06-15: 5 [IU] via INTRAVENOUS

## 2019-06-15 MED ORDER — CALCIUM GLUCONATE-NACL 1-0.675 GM/50ML-% IV SOLN
1.0000 g | Freq: Once | INTRAVENOUS | Status: AC
  Administered 2019-06-15: 1000 mg via INTRAVENOUS
  Filled 2019-06-15: qty 50

## 2019-06-15 MED ORDER — HEPARIN SODIUM (PORCINE) 1000 UNIT/ML DIALYSIS: 4000.0000 [IU] | Freq: Once | INTRAMUSCULAR | Status: DC

## 2019-06-15 MED ORDER — SODIUM ZIRCONIUM CYCLOSILICATE 5 G PO PACK
5.0000 g | PACK | Freq: Once | ORAL | Status: AC
  Administered 2019-06-15: 5 g via ORAL
  Filled 2019-06-15: qty 1

## 2019-06-15 NOTE — ED Notes (Signed)
Pfeiffer MD made aware of critical K.  Lab made aware to redraw K.

## 2019-06-15 NOTE — Discharge Instructions (Signed)
1.  Follow-up with your primary care doctor within the next 3 to 5 days. 2.  Continue dialysis as per your instructions from dialysis center. 3.  Return to the emergency department if you develop fever, chills, worsening or new pain or other concerning symptoms.

## 2019-06-15 NOTE — ED Triage Notes (Signed)
Pt BIB [facility] from home with c/o generalized abdominal pain, nausea, vomiting x1, and dizziness. Tenderness on palpation. Pt states she continues to feel dizzy on arrival.  Dialysis pt MWF, last dialysis Wednesday. L arm restricted. Pt state she has been noncompliant with medications at home   EMS VS BP 156/104 HR 96 RR 20 SpO2 100 RA

## 2019-06-15 NOTE — ED Provider Notes (Addendum)
Assumed after dialysis see note from Dr. Johnney Killian for full HPI.  Patient presented back to the emergency department after rmergency dialysi for hyperkalemia.  Was seen earlier today by Dr. Vallery Ridge for emesis and abdominal pain.  Was noted to have hyperkalemia with a potassium of 7.2.  She had emergent dialysis and her repeat potassium was 3.4.  Repeat abdominal exam is benign.  Soft, nontender.  She is tolerating p.o. intake without difficulty.  Denies any current dizziness or lightheadedness.  Has a nonfocal neurologic exam without deficits.  She denies any sudden onset thunderclap headache.  Has negative orthostatic vital signs. Low suspicion for acute vascular, neurologic etiology at this time of her prior lightheaded.  Denies any prior chest pain, shortness of breath.  Consult with Dr. Justin Mend with nephrology.  States he is okay with her being discharged home as far as nephrology is concerned.  She will follow-up outpatient with her scheduled dialysis.  Stressed medication management at home given she is noncompliant with her home medications. Plan to discharge home per Dr. Charlott Rakes note.  Patient is nontoxic, nonseptic appearing, in no apparent distress.  Patient's pain and other symptoms adequately managed in emergency department.  Fluid bolus given.  Labs, imaging and vitals reviewed.  Patient does not meet the SIRS or Sepsis criteria.  On repeat exam patient does not have a surgical abdomin and there are no peritoneal signs.  No indication of appendicitis, bowel obstruction, bowel perforation, cholecystitis, diverticulitis, PID or ectopic pregnancy.  Patient discharged home with symptomatic treatment and given strict instructions for follow-up with their primary care physician.  I have also discussed reasons to return immediately to the ER.  Patient expresses understanding and agrees with plan.  Dicussed plan with attending, Dr. Sherry Ruffing who agrees with treatment, plan and disposition  2015: Nursing  states that patient refused to sign for discharge and asked me to go and speak with her.  Patient states she did not have any complaints.  She is requesting a Sprite prior to discharge.  She is actively getting dressed and states she has called her ride.  She feels improved and is ready to go home.     Holly Pring A, PA-C 06/15/19 1949    Liane Tribbey A, PA-C 06/15/19 2020    Corinthian Kemler A, PA-C 06/15/19 2022    Tegeler, Gwenyth Allegra, MD 06/15/19 2255

## 2019-06-15 NOTE — ED Notes (Signed)
Pfeiffer MD made aware of Critical K of 7.2

## 2019-06-15 NOTE — ED Notes (Signed)
Pt refused to sign for discharge, states she had no questions.

## 2019-06-15 NOTE — ED Provider Notes (Signed)
[facility] EMERGENCY DEPARTMENT Provider Note   CSN: [id number] Arrival date & time: 06/15/19  0109     History Chief Complaint  Patient presents with  . Abdominal Pain  . Nausea  . Dizziness    [name] is a 34 y.o. female.  HPI Patient has missed today's dialysis session.  It started at 7:30 AM.  [name] did go to the Wednesday session.  Since Wednesday, [name] has had several episodes of vomiting.  2-3 yesterday.  No diarrhea.  [name] reports [name] saw blood in the vomit one time.  No vomiting this morning but still feels nauseated.  [name] has generalized central upper abdominal discomfort.  Nondescript in quality.  [name] reports [name] ran out of Reglan and has been asking her doctor to refill it but not gotten it.  Patient does not make urine.  [name] reports [name] feels slightly confused.  The best that [name] can describe it is "like something is stuck at the top of my head".  Her companion thinks this is because [name] does not sleep at night.  He reports [name] stays up interacting with her dogs and does not get good sleep.  [name] endorses a headache, right sided, nondescript quality.  Denies nasal congestion or sore throat.  Denies cough, chest pain or shortness of breath.  Denies lower extremity swelling or calf pain.    Past Medical History:  Diagnosis Date  . Anemia   . Chronic kidney disease    kidney transplant 07  . Diabetes mellitus    Pt reports diagnosis in June 2011, Type 2  . GERD (gastroesophageal reflux disease)   . Hyperlipidemia   . Hypertension   . 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 Pyelonephritis 06/23/2014  . Seasonal allergies   . UTI (urinary tract infection) 01/09/2015  . 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 Intra-abdominal abscess  ([place]) 10/28/2018  . Hypertensive emergency 09/01/2018  . DKA (diabetic ketoacidosis) ([place]) 05/01/2018  . ESRD on hemodialysis ([place]) 05/01/2018  . Renal failure 03/18/2018  . Cellulitis of left leg 03/01/2018  . Hyperlipidemia 03/01/2018  . Hypocalcemia 03/01/2018  . Metabolic acidosis 32/35/5732  . Hypokalemia 02/03/2018  . Incontinence of bowel 02/03/2018  . Chronic pain 11/11/2017  . Gastroparesis   . Constipation 07/13/2017  . Hematemesis 07/02/2017  . Chronic cholecystitis 06/29/2017  . Type 1 diabetes mellitus with complication, uncontrolled ([place]) 05/25/2015  . Nausea & vomiting 01/09/2015  . Renal transplant recipient   . Immunosuppressed status ([place])   . 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 Chronic kidney disease 01/04/2010  . Chronic kidney disease, stage II (mild) 01/04/2010  . OVARIAN FAILURE, PREMATURE 03/11/2009  . 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 OBESITY 09/25/2007  . Anemia due to chronic kidney disease 09/25/2007  . LEARNING DISABILITY 09/25/2007  . Essential hypertension, benign 09/25/2007    Past Surgical History:  Procedure Laterality Date  . ARTERIOVENOUS GRAFT PLACEMENT Bilateral    "neither work" (10/24/2017)  . AV FISTULA PLACEMENT Left 10/26/2018   Procedure: CREATION OF ARTERIOVENOUS FISTULA  LEFT ARM;  Surgeon: [name], MD;  Location: [place];  Service:  Vascular;  Laterality: Left;  . BASCILIC VEIN TRANSPOSITION Left 12/21/2018   Procedure: Left arm BASILIC VEIN TRANSPOSITION SECOND STAGE;  Surgeon: [name], MD;  Location: [place];  Service: Vascular;  Laterality: Left;  . CHOLECYSTECTOMY N/A 06/30/2017   Procedure: LAPAROSCOPIC CHOLECYSTECTOMY WITH INTRAOPERATIVE CHOLANGIOGRAM;  Surgeon: Excell Seltzer, MD;  Location: [facility];  Service: General;  Laterality: N/A;  . ESOPHAGOGASTRODUODENOSCOPY (EGD) WITH PROPOFOL N/A  07/04/2017   Procedure: ESOPHAGOGASTRODUODENOSCOPY (EGD) WITH PROPOFOL;  Surgeon: Clarene Essex, MD;  Location: [facility];  Service: Endoscopy;  Laterality: N/A;  . INSERTION OF DIALYSIS CATHETER N/A 03/20/2018   Procedure: INSERTION OF TUNNELED DIALYSIS CATHETER - RIGHT INTERANL JUGULAR PLACEMENT;  Surgeon: Angelia Mould, MD;  Location: [place];  Service: Vascular;  Laterality: N/A;  . IR GUIDED [place]  10/28/2018  . KIDNEY TRANSPLANT  2007  . PARATHYROIDECTOMY  ?2012   "3/4 removed" (10/24/2017)  . RENAL BIOPSY Bilateral 2003  . UPPER EXTREMITY VENOGRAPHY Bilateral 10/19/2018   Procedure: UPPER EXTREMITY VENOGRAPHY;  Surgeon: [name], MD;  Location: [place] [facility];  Service: Cardiovascular;  Laterality: Bilateral;  Bilateral      OB History   No obstetric history on file.     Family History  Problem Relation Age of Onset  . Arthritis Mother   . Hypertension Mother   . Aneurysm Mother        died of brain aneurysm  . CAD Father        Has 3 stents  . Diabetes Father        borderline  . Other Brother        Died in war    Social History   Tobacco Use  . Smoking status: Never Smoker  . Smokeless tobacco: Never Used  Substance Use Topics  . Alcohol use: No  . Drug use: No    Home Medications Prior to Admission medications   Medication Sig Start Date End Date Taking? Authorizing Provider  acetaminophen (TYLENOL) 500 MG tablet Take 1,000 mg by mouth every 6 (six) hours as needed for moderate pain (pain).    Yes [provider]  b complex-vitamin c-folic acid (NEPHRO-VITE) 0.8 MG TABS tablet Take 1 tablet by mouth every Monday, Wednesday, and Friday. After dialysis on dialysis days 07/19/18  Yes [provider]  calcium acetate (PHOSLO) 667 MG capsule Take 1,334 mg by mouth 2 (two) times daily. Taking 1 capsule with a snack 11/24/18  Yes [provider]  Calcium Carbonate Antacid (CALCIUM CARBONATE, DOSED IN MG  ELEMENTAL CALCIUM,) 1250 MG/5ML SUSP Take 5 mLs by mouth daily. 11/17/18  Yes [provider]  cetirizine (ZYRTEC) 10 MG tablet Take 10 mg by mouth daily as needed for allergies.   Yes [provider]  D 1000 25 MCG (1000 UT) capsule Take 1,000 Units by mouth daily. 04/27/19  Yes [provider]  diclofenac Sodium (VOLTAREN) 1 % GEL Apply 2 g topically 3 (three) times daily as needed (pain).  04/19/19  Yes [provider]  dicyclomine (BENTYL) 20 MG tablet Take 1 tablet (20 mg total) by mouth 2 (two) times daily. Patient taking differently: Take 20 mg by mouth 2 (two) times daily as needed (stomach issues.).  02/06/18  Yes [name], Ripudeep K, MD  esomeprazole (NEXIUM) 40 MG capsule Take 40 mg by mouth every morning. 06/07/19  Yes [provider]  fluticasone (FLONASE) 50 MCG/ACT nasal spray Place 2 sprays into both nostrils daily  as needed for allergies. 09/03/18  Yes [name], MD  gabapentin (NEURONTIN) 400 MG capsule Take 400 mg by mouth 3 (three) times daily. 04/29/19  Yes [provider]  hydrOXYzine (ATARAX/VISTARIL) 50 MG tablet Take 1 tablet (50 mg total) by mouth 2 (two) times daily as needed for itching. 02/06/18  Yes [name], Ripudeep K, MD  insulin aspart (NOVOLOG FLEXPEN) 100 UNIT/ML FlexPen Inject 2-10 Units into the skin See admin instructions. as needed for blood sugar management (sliding scale)   Yes [provider]  insulin degludec (TRESIBA) 100 UNIT/ML SOPN FlexTouch Pen Inject 0.6 mLs (60 Units total) into the skin 2 (two) times daily. Reports taking 24 units QAM Patient taking differently: Inject 20 Units into the skin daily.  03/06/18  Yes Charlynne Cousins, MD  loperamide (IMODIUM) 2 MG capsule Take 2 mg by mouth 4 (four) times daily as needed for diarrhea or loose stools.    Yes [provider]  metoCLOPramide (REGLAN) 10 MG tablet Take 1 tablet (10 mg total) by mouth 3 (three) times daily before meals. Patient  taking differently: Take 10 mg by mouth 3 (three) times daily as needed (stomach issues.).  02/06/18  Yes [name], Ripudeep K, MD  metoprolol tartrate (LOPRESSOR) 25 MG tablet Take 1 tablet (25 mg total) by mouth 2 (two) times daily. 02/07/19 06/15/19 Yes Trifan, Carola Rhine, MD  SANTYL ointment Apply 1 application topically daily. 11/18/18  Yes [provider]  simvastatin (ZOCOR) 20 MG tablet Take 20 mg by mouth at bedtime.    Yes [provider]  ACCU-CHEK SOFTCLIX LANCETS lancets Use to check blood sugar 4 times per day. 12/29/15   Renato Shin, MD  carvedilol (COREG) 12.5 MG tablet Take 12.5 mg by mouth in the morning and at bedtime. 03/27/18   [provider]  HYDROcodone-acetaminophen (NORCO) 5-325 MG tablet Take 1 tablet by mouth every 6 (six) hours as needed for moderate pain. Patient not taking: Reported on 06/15/2019 12/21/18   [name], PA-C  amLODipine (NORVASC) 10 MG tablet Take 1 tablet (10 mg total) by mouth daily. 03/25/18 02/07/19  Thurnell Lose, MD    Allergies    Benadryl [diphenhydramine-zinc acetate], Diphenhydramine hcl, Doxycycline, Motrin [ibuprofen], Banana, Iron dextran, Shellfish allergy, and Chlorhexidine  Review of Systems   Review of Systems 10 Systems reviewed and are negative for acute change except as noted in the HPI.  Physical Exam Updated Vital Signs BP (!) 143/100   Pulse 87   Temp 97.7 F (36.5 C) (Oral)   Resp 10   Ht 5\' 6"  (1.676 m)   Wt 74.4 kg   SpO2 94%   BMI 26.47 kg/m   Physical Exam Constitutional:      Comments: Patient is alert and nontoxic.  No respiratory distress.  HENT:     Head: Normocephalic and atraumatic.     Nose: Nose normal.     Mouth/Throat:     Mouth: Mucous membranes are moist.     Pharynx: Oropharynx is clear.  Eyes:     Extraocular Movements: Extraocular movements intact.     Conjunctiva/sclera: Conjunctivae normal.     Pupils: Pupils are equal, round, and reactive to light.    Cardiovascular:     Rate and Rhythm: Normal rate and regular rhythm.  Pulmonary:     Effort: Pulmonary effort is normal.     Breath sounds: Normal breath sounds.  Abdominal:     Comments: Abdomen is soft.  No guarding.  Patient  endorses mild discomfort to palpation in the epigastrium and upper abdomen.  Multiple well-healed surgical scars.  Musculoskeletal:        General: No swelling. Normal range of motion.     Cervical back: Neck supple.     Right lower leg: No edema.     Left lower leg: No edema.  Skin:    General: Skin is warm and dry.  Neurological:     General: No focal deficit present.     Mental Status: [name] is oriented to person, place, and time.     Cranial Nerves: No cranial nerve deficit.     Coordination: Coordination normal.     ED Results / Procedures / Treatments   Labs (all labs ordered are listed, but only abnormal results are displayed) Labs Reviewed  COMPREHENSIVE METABOLIC PANEL - Abnormal; Notable for the following components:      Result Value   Potassium 6.7 (*)    Chloride 93 (*)    Glucose, Bld 236 (*)    BUN 53 (*)    Creatinine, Ser 9.39 (*)    Calcium 6.5 (*)    AST 55 (*)    GFR calc non Af Amer 5 (*)    GFR calc Af Amer 6 (*)    Anion gap 18 (*)    All other components within normal limits  CBC - Abnormal; Notable for the following components:   RBC 3.17 (*)    Hemoglobin 10.0 (*)    HCT 31.9 (*)    MCV 100.6 (*)    RDW 18.0 (*)    All other components within normal limits  POTASSIUM - Abnormal; Notable for the following components:   Potassium 7.2 (*)    All other components within normal limits  CBG MONITORING, ED - Abnormal; Notable for the following components:   Glucose-Capillary 246 (*)    All other components within normal limits  I-STAT BETA HCG BLOOD, ED ([facility], [facility], [facility] ONLY) - Abnormal; Notable for the following components:   I-stat hCG, quantitative 5.3 (*)    All other components within normal limits  CBG MONITORING, ED -  Abnormal; Notable for the following components:   Glucose-Capillary 235 (*)    All other components within normal limits  RESPIRATORY PANEL BY RT PCR (FLU A&B, COVID)  LIPASE, BLOOD  RENAL FUNCTION PANEL  CBC    EKG EKG Interpretation  Date/Time:  Friday June 15 2019 07:09:52 EDT Ventricular Rate:  91 PR Interval:    QRS Duration: 99 QT Interval:  415 QTC Calculation: 511 R Axis:   76 Text Interpretation: Sinus rhythm Prolonged QT interval When compared with ECG of 02/07/2019, QT has lengthened Confirmed by Delora Fuel ([id number]) on 06/15/2019 7:13:40 AM   Radiology DG Chest Port 1 View  Result Date: 06/15/2019 CLINICAL DATA:  34 year old female with vomiting EXAM: PORTABLE CHEST 1 VIEW COMPARISON:  02/07/2019 FINDINGS: The cardiomediastinal silhouette unchanged in size and contour, borderline enlarged. Low lung volumes accentuating the interstitium. Linear opacity at the right lung base. No pneumothorax or large pleural effusion. No interlobular septal thickening. Interval removal of right IJ tunneled hemodialysis catheter. No displaced fracture IMPRESSION: Low lung volumes with atelectasis/scarring and no evidence of acute cardiopulmonary disease. Electronically Signed   By: Corrie Mckusick D.O.   On: 06/15/2019 08:04    Procedures Procedures (including critical care time) CRITICAL CARE Performed by: Charlesetta Shanks   Total critical care time: 30 minutes  Critical care time was exclusive of separately billable  procedures and treating other patients.  Critical care was necessary to treat or prevent imminent or life-threatening deterioration.  Critical care was time spent personally by me on the following activities: development of treatment plan with patient and/or surrogate as well as nursing, discussions with consultants, evaluation of patient's response to treatment, examination of patient, obtaining history from patient or surrogate, ordering and performing treatments and  interventions, ordering and review of laboratory studies, ordering and review of radiographic studies, pulse oximetry and re-evaluation of patient's condition. Medications Ordered in ED Medications  pentafluoroprop-tetrafluoroeth (GEBAUERS) aerosol 1 application (has no administration in time range)  lidocaine (PF) (XYLOCAINE) 1 % injection 5 mL (has no administration in time range)  lidocaine-prilocaine (EMLA) cream 1 application (has no administration in time range)  0.9 %  sodium chloride infusion (has no administration in time range)  0.9 %  sodium chloride infusion (has no administration in time range)  heparin injection 1,000 Units (has no administration in time range)  alteplase (CATHFLO ACTIVASE) injection 2 mg (has no administration in time range)  heparin injection 4,000 Units (has no administration in time range)  sodium chloride flush (NS) 0.9 % injection 3 mL (3 mLs Intravenous Given 06/15/19 1157)  calcium gluconate 1 g/ 50 mL sodium chloride IVPB (0 g Intravenous Stopped 06/15/19 1218)  albuterol (PROVENTIL) (2.5 MG/3ML) 0.083% nebulizer solution 10 mg (10 mg Nebulization Given 06/15/19 1152)  insulin aspart (novoLOG) injection 5 Units (5 Units Intravenous Given 06/15/19 1151)    And  dextrose 50 % solution 50 mL (50 mLs Intravenous Given 06/15/19 1151)  sodium zirconium cyclosilicate (LOKELMA) packet 5 g (5 g Oral Given 06/15/19 1153)    ED Course  I have reviewed the triage vital signs and the nursing notes.  Pertinent labs & imaging results that were available during my care of the patient were reviewed by me and considered in my medical decision making (see chart for details).  Clinical Course as of Jun 15 1315  Fri Jun 15, 2019  0950 Consult: Dr. Justin Mend called back and advises that Dr. Harrie Jeans is on-call for dialysis at this time.   [MP]  [phone] We will repeat potassium.  Lab reported slight amount of hemolysis.   [MP]  6314 Consult: Dr. Royce Macadamia will arrange for dialysis.    [MP]    Clinical Course User Index [MP] Charlesetta Shanks, MD   MDM Rules/Calculators/A&P                      Patient has missed dialysis for this a.m. session.  [name] reports [name] has had some intermittent vomiting over the past 2 days since her last session.  [name] describes generalized abdominal discomfort.  No diarrhea.  Patient is clinically well in appearance.  Abdomen is soft without guarding.  Low suspicion for any surgical intra-abdominal process.  No leukocytosis or fever.  No vomiting today.  Patient does have hyperkalemia.  This was verified by redraw with a potassium of 7.2.  EKG does show prolonged QT interval.  Patient was started on hyperkalemia protocol medications with calcium gluconate, albuterol nebulizer, dextrose insulin.  [name] is given one 5 mg dose of Lokelma.  Consultation is made with nephrology for urgent dialysis.  Patient is otherwise stable.  At this time no indication of acute or surgical abdomen.  I do feel [name] is stable for dialysis and subsequent discharged with follow-up with PCP. Final Clinical Impression(s) / ED Diagnoses Final diagnoses:  Hyperkalemia  ESRD needing dialysis (  [place])  Generalized abdominal pain    Rx / DC Orders ED Discharge Orders    None       Charlesetta Shanks, MD 06/15/19 1320

## 2019-06-16 ENCOUNTER — Observation Stay (HOSPITAL_COMMUNITY)
Admission: EM | Admit: 2019-06-16 | Discharge: 2019-06-17 | Disposition: A | Discharge status: Home / Self Care | Payer: Medicare Other | Attending: Internal Medicine | Admitting: Internal Medicine

## 2019-06-16 ENCOUNTER — Emergency Department (HOSPITAL_COMMUNITY): Payer: Medicare Other

## 2019-06-16 ENCOUNTER — Other Ambulatory Visit: Payer: Self-pay

## 2019-06-16 DIAGNOSIS — N189 Chronic kidney disease, unspecified: Secondary | ICD-10-CM | POA: Diagnosis present

## 2019-06-16 DIAGNOSIS — K219 Gastro-esophageal reflux disease without esophagitis: Secondary | ICD-10-CM | POA: Insufficient documentation

## 2019-06-16 DIAGNOSIS — Z79899 Other long term (current) drug therapy: Secondary | ICD-10-CM | POA: Diagnosis not present

## 2019-06-16 DIAGNOSIS — Z940 Kidney transplant status: Secondary | ICD-10-CM | POA: Insufficient documentation

## 2019-06-16 DIAGNOSIS — D631 Anemia in chronic kidney disease: Secondary | ICD-10-CM | POA: Diagnosis present

## 2019-06-16 DIAGNOSIS — Z992 Dependence on renal dialysis: Secondary | ICD-10-CM | POA: Diagnosis not present

## 2019-06-16 DIAGNOSIS — I120 Hypertensive chronic kidney disease with stage 5 chronic kidney disease or end stage renal disease: Secondary | ICD-10-CM | POA: Insufficient documentation

## 2019-06-16 DIAGNOSIS — I10 Essential (primary) hypertension: Secondary | ICD-10-CM | POA: Diagnosis not present

## 2019-06-16 DIAGNOSIS — E1043 Type 1 diabetes mellitus with diabetic autonomic (poly)neuropathy: Principal | ICD-10-CM | POA: Insufficient documentation

## 2019-06-16 DIAGNOSIS — Z888 Allergy status to other drugs, medicaments and biological substances status: Secondary | ICD-10-CM | POA: Insufficient documentation

## 2019-06-16 DIAGNOSIS — K3184 Gastroparesis: Secondary | ICD-10-CM

## 2019-06-16 DIAGNOSIS — Z886 Allergy status to analgesic agent status: Secondary | ICD-10-CM | POA: Diagnosis not present

## 2019-06-16 DIAGNOSIS — E1022 Type 1 diabetes mellitus with diabetic chronic kidney disease: Secondary | ICD-10-CM | POA: Diagnosis not present

## 2019-06-16 DIAGNOSIS — Z881 Allergy status to other antibiotic agents status: Secondary | ICD-10-CM | POA: Diagnosis not present

## 2019-06-16 DIAGNOSIS — E875 Hyperkalemia: Secondary | ICD-10-CM | POA: Diagnosis not present

## 2019-06-16 DIAGNOSIS — E1143 Type 2 diabetes mellitus with diabetic autonomic (poly)neuropathy: Secondary | ICD-10-CM | POA: Diagnosis present

## 2019-06-16 DIAGNOSIS — N186 End stage renal disease: Secondary | ICD-10-CM | POA: Diagnosis not present

## 2019-06-16 DIAGNOSIS — R112 Nausea with vomiting, unspecified: Secondary | ICD-10-CM | POA: Diagnosis present

## 2019-06-16 DIAGNOSIS — E109 Type 1 diabetes mellitus without complications: Secondary | ICD-10-CM | POA: Diagnosis present

## 2019-06-16 DIAGNOSIS — E785 Hyperlipidemia, unspecified: Secondary | ICD-10-CM | POA: Diagnosis not present

## 2019-06-16 DIAGNOSIS — E1065 Type 1 diabetes mellitus with hyperglycemia: Secondary | ICD-10-CM

## 2019-06-16 DIAGNOSIS — IMO0002 Reserved for concepts with insufficient information to code with codable children: Secondary | ICD-10-CM | POA: Diagnosis present

## 2019-06-16 DIAGNOSIS — E108 Type 1 diabetes mellitus with unspecified complications: Secondary | ICD-10-CM

## 2019-06-16 DIAGNOSIS — Z794 Long term (current) use of insulin: Secondary | ICD-10-CM | POA: Diagnosis not present

## 2019-06-16 LAB — CBC
HCT: 40.6 % (ref 36.0–46.0)
Hemoglobin: 13.2 g/dL (ref 12.0–15.0)
MCH: 32.2 pg (ref 26.0–34.0)
MCHC: 32.5 g/dL (ref 30.0–36.0)
MCV: 99 fL (ref 80.0–100.0)
Platelets: 180 10*3/uL (ref 150–400)
RBC: 4.1 MIL/uL (ref 3.87–5.11)
RDW: 17.8 % — ABNORMAL HIGH (ref 11.5–15.5)
WBC: 9.2 10*3/uL (ref 4.0–10.5)
nRBC: 0 % (ref 0.0–0.2)

## 2019-06-16 LAB — POCT I-STAT EG7
Bicarbonate: 24.4 mmol/L (ref 20.0–28.0)
Calcium, Ion: 0.91 mmol/L — ABNORMAL LOW (ref 1.15–1.40)
HCT: 34 % — ABNORMAL LOW (ref 36.0–46.0)
Hemoglobin: 11.6 g/dL — ABNORMAL LOW (ref 12.0–15.0)
O2 Saturation: 92 %
Potassium: 5.2 mmol/L — ABNORMAL HIGH (ref 3.5–5.1)
Sodium: 138 mmol/L (ref 135–145)
TCO2: 26 mmol/L (ref 22–32)
pCO2, Ven: 39.6 mmHg — ABNORMAL LOW (ref 44.0–60.0)
pH, Ven: 7.398 (ref 7.250–7.430)
pO2, Ven: 64 mmHg — ABNORMAL HIGH (ref 32.0–45.0)

## 2019-06-16 LAB — COMPREHENSIVE METABOLIC PANEL
ALT: 20 U/L (ref 0–44)
AST: 27 U/L (ref 15–41)
Albumin: 4.5 g/dL (ref 3.5–5.0)
Alkaline Phosphatase: 75 U/L (ref 38–126)
Anion gap: 18 — ABNORMAL HIGH (ref 5–15)
BUN: 37 mg/dL — ABNORMAL HIGH (ref 6–20)
CO2: 26 mmol/L (ref 22–32)
Calcium: 9.5 mg/dL (ref 8.9–10.3)
Chloride: 93 mmol/L — ABNORMAL LOW (ref 98–111)
Creatinine, Ser: 7.42 mg/dL — ABNORMAL HIGH (ref 0.44–1.00)
GFR calc Af Amer: 7 mL/min — ABNORMAL LOW (ref >60)
GFR calc non Af Amer: 6 mL/min — ABNORMAL LOW (ref >60)
Glucose, Bld: 387 mg/dL — ABNORMAL HIGH (ref 70–99)
Potassium: 5.3 mmol/L — ABNORMAL HIGH (ref 3.5–5.1)
Sodium: 137 mmol/L (ref 135–145)
Total Bilirubin: 0.8 mg/dL (ref 0.3–1.2)
Total Protein: 9.9 g/dL — ABNORMAL HIGH (ref 6.5–8.1)

## 2019-06-16 LAB — LACTIC ACID, PLASMA: Lactic Acid, Venous: 2.6 mmol/L (ref 0.5–1.9)

## 2019-06-16 LAB — CK: Total CK: 61 U/L (ref 38–234)

## 2019-06-16 LAB — BETA-HYDROXYBUTYRIC ACID: Beta-Hydroxybutyric Acid: 0.37 mmol/L — ABNORMAL HIGH (ref 0.05–0.27)

## 2019-06-16 LAB — GLUCOSE, CAPILLARY: Glucose-Capillary: 335 mg/dL — ABNORMAL HIGH (ref 70–99)

## 2019-06-16 MED ORDER — METOCLOPRAMIDE HCL 5 MG/ML IJ SOLN
5.0000 mg | Freq: Once | INTRAMUSCULAR | Status: AC
  Administered 2019-06-16: 5 mg via INTRAVENOUS
  Filled 2019-06-16: qty 2

## 2019-06-16 MED ORDER — IOHEXOL 300 MG/ML  SOLN
100.0000 mL | Freq: Once | INTRAMUSCULAR | Status: AC | PRN
  Administered 2019-06-16: 100 mL via INTRAVENOUS

## 2019-06-16 MED ORDER — ACETAMINOPHEN 325 MG PO TABS: 650.0000 mg | ORAL_TABLET | Freq: Four times a day (QID) | ORAL | Status: DC | PRN

## 2019-06-16 MED ORDER — LORATADINE 10 MG PO TABS
10.0000 mg | ORAL_TABLET | Freq: Every day | ORAL | Status: DC
  Administered 2019-06-17: 10 mg via ORAL
  Filled 2019-06-16: qty 1

## 2019-06-16 MED ORDER — SODIUM CHLORIDE 0.9% FLUSH: 3.0000 mL | INTRAVENOUS | Status: DC | PRN

## 2019-06-16 MED ORDER — ONDANSETRON HCL 4 MG/2ML IJ SOLN
4.0000 mg | Freq: Once | INTRAMUSCULAR | Status: AC
  Administered 2019-06-16: 19:00:00 4 mg via INTRAVENOUS
  Filled 2019-06-16: qty 2

## 2019-06-16 MED ORDER — ONDANSETRON HCL 4 MG/2ML IJ SOLN: 4.0000 mg | Freq: Four times a day (QID) | INTRAMUSCULAR | Status: DC | PRN

## 2019-06-16 MED ORDER — METOPROLOL TARTRATE 5 MG/5ML IV SOLN
2.5000 mg | Freq: Once | INTRAVENOUS | Status: AC
  Administered 2019-06-16: 2.5 mg via INTRAVENOUS
  Filled 2019-06-16: qty 5

## 2019-06-16 MED ORDER — SODIUM CHLORIDE 0.9 % IV SOLN: 250.0000 mL | INTRAVENOUS | Status: DC | PRN

## 2019-06-16 MED ORDER — ACETAMINOPHEN 650 MG RE SUPP: 650.0000 mg | Freq: Four times a day (QID) | RECTAL | Status: DC | PRN

## 2019-06-16 MED ORDER — SODIUM CHLORIDE 0.9% FLUSH
3.0000 mL | Freq: Two times a day (BID) | INTRAVENOUS | Status: DC
  Administered 2019-06-16 – 2019-06-17 (×2): 3 mL via INTRAVENOUS

## 2019-06-16 MED ORDER — INSULIN GLARGINE 100 UNIT/ML ~~LOC~~ SOLN
20.0000 [IU] | Freq: Every day | SUBCUTANEOUS | Status: DC
  Administered 2019-06-17: 20 [IU] via SUBCUTANEOUS
  Filled 2019-06-16: qty 0.2

## 2019-06-16 MED ORDER — HYDROXYZINE HCL 25 MG PO TABS: 50.0000 mg | ORAL_TABLET | Freq: Two times a day (BID) | ORAL | Status: DC | PRN

## 2019-06-16 MED ORDER — HEPARIN SODIUM (PORCINE) 5000 UNIT/ML IJ SOLN
5000.0000 [IU] | Freq: Three times a day (TID) | INTRAMUSCULAR | Status: DC
  Administered 2019-06-16 – 2019-06-17 (×2): 5000 [IU] via SUBCUTANEOUS
  Filled 2019-06-16 (×2): qty 1

## 2019-06-16 MED ORDER — CALCIUM ACETATE (PHOS BINDER) 667 MG PO CAPS
1334.0000 mg | ORAL_CAPSULE | Freq: Two times a day (BID) | ORAL | Status: DC
  Administered 2019-06-17: 1334 mg via ORAL
  Filled 2019-06-16: qty 2

## 2019-06-16 MED ORDER — CALCIUM CARBONATE ANTACID 1250 MG/5ML PO SUSP
500.0000 mg | Freq: Every day | ORAL | Status: DC
  Administered 2019-06-17: 500 mg via ORAL
  Filled 2019-06-16: qty 5

## 2019-06-16 MED ORDER — METOPROLOL TARTRATE 25 MG PO TABS
25.0000 mg | ORAL_TABLET | Freq: Two times a day (BID) | ORAL | Status: DC
  Administered 2019-06-16 – 2019-06-17 (×2): 25 mg via ORAL
  Filled 2019-06-16: qty 1

## 2019-06-16 MED ORDER — METOCLOPRAMIDE HCL 5 MG/ML IJ SOLN
10.0000 mg | Freq: Once | INTRAMUSCULAR | Status: AC
  Administered 2019-06-16: 15:00:00 10 mg via INTRAVENOUS
  Filled 2019-06-16: qty 2

## 2019-06-16 MED ORDER — MORPHINE SULFATE (PF) 4 MG/ML IV SOLN
4.0000 mg | Freq: Once | INTRAVENOUS | Status: AC
  Administered 2019-06-16: 4 mg via INTRAVENOUS
  Filled 2019-06-16: qty 1

## 2019-06-16 MED ORDER — HYDROCODONE-ACETAMINOPHEN 5-325 MG PO TABS
1.0000 | ORAL_TABLET | ORAL | Status: DC | PRN
  Administered 2019-06-16 (×2): 1 via ORAL
  Filled 2019-06-16 (×2): qty 1

## 2019-06-16 MED ORDER — DICYCLOMINE HCL 20 MG PO TABS: 20.0000 mg | ORAL_TABLET | Freq: Two times a day (BID) | ORAL | Status: DC | PRN

## 2019-06-16 MED ORDER — GABAPENTIN 400 MG PO CAPS
400.0000 mg | ORAL_CAPSULE | Freq: Three times a day (TID) | ORAL | Status: DC
  Administered 2019-06-16 – 2019-06-17 (×2): 400 mg via ORAL
  Filled 2019-06-16 (×2): qty 1

## 2019-06-16 MED ORDER — SODIUM CHLORIDE 0.9 % IV BOLUS
250.0000 mL | Freq: Once | INTRAVENOUS | Status: AC
  Administered 2019-06-17: 250 mL via INTRAVENOUS

## 2019-06-16 MED ORDER — PANTOPRAZOLE SODIUM 40 MG PO TBEC
40.0000 mg | DELAYED_RELEASE_TABLET | Freq: Every day | ORAL | Status: DC
  Administered 2019-06-17: 40 mg via ORAL

## 2019-06-16 MED ORDER — ONDANSETRON HCL 4 MG PO TABS: 4.0000 mg | ORAL_TABLET | Freq: Four times a day (QID) | ORAL | Status: DC | PRN

## 2019-06-16 MED ORDER — METOCLOPRAMIDE HCL 5 MG/ML IJ SOLN: 5.0000 mg | Freq: Four times a day (QID) | INTRAMUSCULAR | Status: DC | PRN

## 2019-06-16 MED ORDER — INSULIN ASPART 100 UNIT/ML ~~LOC~~ SOLN
0.0000 [IU] | SUBCUTANEOUS | Status: DC
  Administered 2019-06-16: 4 [IU] via SUBCUTANEOUS
  Administered 2019-06-17: 2 [IU] via SUBCUTANEOUS

## 2019-06-16 MED ORDER — HYDRALAZINE HCL 20 MG/ML IJ SOLN
10.0000 mg | INTRAMUSCULAR | Status: DC | PRN
  Administered 2019-06-16: 10 mg via INTRAVENOUS
  Filled 2019-06-16: qty 1

## 2019-06-16 NOTE — ED Triage Notes (Signed)
Pt c/o RLQ abd pain, nausea, vomiting.  Pt here x 2 yesterday, had dialysis.   Pt took Zofran today, no other medications.

## 2019-06-16 NOTE — Progress Notes (Signed)
Lactic acid is 2.6 and BMP needing recollection d/t hemolyzed specimen. Alger Memos, NP notified.

## 2019-06-16 NOTE — ED Provider Notes (Signed)
Patient care assumed at 1500. Patient with history of ESR D as well as diabetes here for evaluation of abdominal pain and vomiting. Labs and CT scan pending at time of patient care transferred.  Labs with mild hypokalemia, hyperglycemia. Normal bicarb. PH is within normal limits, presentation is not consistent with DKA. CBC with normal hemoglobin, no leukocytosis. CT abdomen pelvis without evidence of obstruction. Patient does have a history of abscess at her prior renal transplant site that was treated with a surgical drain last year. Imaging today demonstrates resolution of the prior fluid collection but there is a small amount of airlock he wills in this region. She does not have focal tenderness in this area but has generalized abdominal tenderness. On patient history she states that current symptoms are similar to her gastroparesis flares. Feel recurrent abscess and underlying infection is unlikely at this time. She was treated with multiple doses of antiemetics with persistent nausea. She has been noted to be hypertensive during her ED stay, unable to tolerate her oral metoprolol, will treat with IV metoprolol. Medicine consulted for admission for gastroparesis flare.   Valerie Reichert, MD 06/16/19 2321

## 2019-06-16 NOTE — ED Notes (Signed)
Provider made aware of pts pain level; Provider would like to assess pt prior to ordering additional pain medicine. No further orders at this time and RN will continue to monitor

## 2019-06-16 NOTE — H&P (Signed)
[name] IWP:[id number] DOB: [birth year] DOA: 06/16/2019     PCP: [name], MD   Outpatient Specialists  NEphrology:    [facility]   Patient arrived to ER on 06/16/19 at 1333  Patient coming from: home Lives alone,      Chief Complaint:   Chief Complaint  Patient presents with  . Abdominal Pain    HPI: [name] is a 34 y.o. 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 Of note patient has history of intra-abdominal abscess but have not had any fevers or chills recently she had her drainage removed. Her transplant is no longer functioning.  Denies THC   Infectious risk factors:  Reports N/V/Diarrhea/abdominal pain,      Has  been vaccinated against COVID 2 weeks ago 1 st shot  In  ER   COVID TEST  NEGATIVE within 24 h  Lab Results  Component Value Date   [place] 06/15/2019   [place] NEGATIVE 12/19/2018   [place] NEGATIVE 10/28/2018   [place] NEGATIVE 10/24/2018     Regarding pertinent Chronic problems:    Hyperlipidemia -  on statins zocor   HTN on metoprolol, Norvasc   DM 1 -  Lab Results  Component Value Date   HGBA1C 10.1 (H) 09/01/2018   on insulin   ESRD - on HD Lab Results  Component Value Date   CREATININE 7.42 (H) 06/16/2019   CREATININE 10.34 (H) 06/15/2019   CREATININE 9.39 (H)  06/15/2019    While in ER: No evidence of DKA potassium now improved to 5.3 Given some abdominal discomfort repeat CT scan was done which was nonspecific No evidence of obstruction surgical drain was removed fluid collection resolved with a small amount of air walk may be as a result of prior drains in place Patient persistently having nausea despite multiple doses of antiemetics Since she has not been able to take her by mouth medications she was given IV metoprolol  Hospitalist was called for admission for intractable nausea vomiting secondary to gastroparesis secondary to diabetes mellitus type 1  The following Work up has been ordered so far:  Orders Placed This Encounter  Procedures  . CT ABDOMEN PELVIS W CONTRAST  . CBC  . Comprehensive metabolic panel  . Blood gas, venous (at [facility] and [facility], not at [facility])  . Consult to hospitalist  ALL PATIENTS BEING ADMITTED/HAVING PROCEDURES NEED COVID-19 SCREENING  . I-Stat venous blood gas, ED  . POCT I-Stat EG7  . ED EKG    Following Medications were ordered in ER: Medications  metoCLOPramide (REGLAN) injection 10 mg (10 mg Intravenous Given 06/16/19 1444)  iohexol (OMNIPAQUE) 300 MG/ML solution 100 mL (100 mLs Intravenous Contrast Given 06/16/19 1556)  metoCLOPramide (REGLAN) injection 5 mg (5 mg Intravenous Given 06/16/19 1719)  morphine 4 MG/ML injection 4 mg (4 mg Intravenous Given 06/16/19 1719)  metoprolol tartrate (LOPRESSOR) injection 2.5 mg (2.5 mg Intravenous Given 06/16/19 1720)  ondansetron (ZOFRAN) injection 4 mg (4 mg Intravenous Given 06/16/19 1924)        Consult Orders  (From admission, onward)         Start     Ordered   06/16/19 1852  Consult to hospitalist  ALL PATIENTS BEING ADMITTED/HAVING PROCEDURES NEED COVID-19 SCREENING  Once    Comments: ALL PATIENTS BEING ADMITTED/HAVING PROCEDURES NEED COVID-19 SCREENING  Provider:  (Not yet assigned)  Question Answer Comment  Place call to: [facility]   Reason for Consult  Admit      06/16/19 1851           Significant initial  Findings: Abnormal Labs Reviewed  CBC - Abnormal; Notable for the following components:      Result Value   RDW 17.8 (*)    All other components within normal limits  COMPREHENSIVE METABOLIC PANEL - Abnormal; Notable for the following components:   Potassium 5.3 (*)    Chloride 93 (*)    Glucose, Bld 387 (*)    BUN 37 (*)    Creatinine, Ser 7.42 (*)    Total Protein 9.9 (*)    GFR calc non Af Amer 6 (*)    GFR calc Af Amer 7 (*)    Anion gap 18 (*)    All other components within normal limits  POCT I-STAT EG7 - Abnormal; Notable for the following components:   pCO2, Ven 39.6 (*)    pO2, Ven 64.0 (*)    Potassium 5.2 (*)    Calcium, Ion 0.91 (*)    HCT 34.0 (*)    Hemoglobin 11.6 (*)    All other components within normal limits    Otherwise labs showing:    Recent Labs  Lab 06/15/19 0735 06/15/19 0735 06/15/19 0956 06/15/19 1427 06/15/19 1501 06/16/19 1439 06/16/19 1647  NA 135  --   --  140  --  137 138  K 6.7*   < > 7.2* 4.8 3.4* 5.3* 5.2*  CO2 24  --   --  24  --  26  --   GLUCOSE 236*  --   --  281*  --  387*  --   BUN 53*  --   --  59*  --  37*  --   CREATININE 9.39*  --   --  10.34*  --  7.42*  --   CALCIUM 6.5*  --   --  6.8*  --  9.5  --   PHOS  --   --   --  7.0*  --   --   --    < > = values in this interval not displayed.    Cr   Stable,  Lab Results  Component Value Date   CREATININE 7.42 (H) 06/16/2019   CREATININE 10.34 (H) 06/15/2019   CREATININE 9.39 (H) 06/15/2019    Recent Labs  Lab 06/15/19 0735 06/15/19 1427 06/16/19 1439  AST 55*  --  27  ALT 17  --  20  ALKPHOS 53  --  75  BILITOT 0.4  --  0.8  PROT 7.1  --  9.9*  ALBUMIN 3.5 3.2* 4.5   Lab Results  Component Value Date   CALCIUM 9.5 06/16/2019   PHOS 7.0 (H) 06/15/2019    WBC      Component Value Date/Time   WBC 9.2 06/16/2019 1439   ANC  Component Value Date/Time   NEUTROABS 4.2 10/31/2018 0619    ALC No components found for: LYMPHAB    Plt: Lab Results  Component Value Date   PLT 180 06/16/2019    Lactic Acid, Venous    Component Value Date/Time   LATICACIDVEN 1.2 10/27/2018 1840      COVID-19 Labs  No results for input(s): DDIMER, FERRITIN, LDH, CRP in the last 72 hours.  Lab Results  Component Value Date   SARSCOV2NAA NEGATIVE 06/15/2019   SARSCOV2NAA NEGATIVE 12/19/2018   SARSCOV2NAA NEGATIVE 10/28/2018   [place] NEGATIVE 10/24/2018       HG/HC  Stable,     Component Value Date/Time   HGB 11.6 (L) 06/16/2019 1647   HCT 34.0 (L) 06/16/2019 1647    Recent Labs  Lab 06/15/19 0735  LIPASE 36   No results for input(s): AMMONIA in the last 168 hours.  No components found for: LABALBU    ECG: Ordered Personally reviewed by me showing: HR : 106 Rhythm:  Sinus tachycardia     no evidence of ischemic changes QTC 498   BNP (last 3 results) Recent Labs    09/01/18 1324  BNP 329.5*    DM  labs:  HbA1C: Recent Labs    09/01/18 1324  HGBA1C 10.1*       CBG (last 3)  Recent Labs    06/15/19 0707 06/15/19 0737  GLUCAP 246* 235*     UA  not ordered   Ordered    CTabd/pelvis -Near complete interval resolution of a previously seen air and fluid collection   ED Triage Vitals  Enc Vitals Group     BP 06/16/19 1339 (!) 152/112     Pulse Rate 06/16/19 1339 (!) 104     Resp 06/16/19 1339 (!) 22     Temp 06/16/19 1339 97.7 F (36.5 C)     Temp Source 06/16/19 1339 Oral     SpO2 06/16/19 1339 100 %     Weight --      Height --      Head Circumference --      Peak Flow --      Pain Score 06/16/19 1344 9     Pain Loc --      Pain Edu? --      Excl. in [place]? --   TMAX(24)@       Latest  Blood pressure (!) 196/125, pulse (!) 107, temperature 97.7 F (36.5 C), temperature source Oral, resp. rate 13, SpO2 100 %.    Review of Systems:    Pertinent positives include:   abdominal pain, nausea, vomiting, diarrhea,  Constitutional:   No weight loss, night sweats, Fevers, chills, fatigue, weight loss  HEENT:  No headaches, Difficulty swallowing,Tooth/dental problems,Sore throat,  No sneezing, itching, ear ache, nasal congestion, post nasal drip,  Cardio-vascular:  No chest pain, Orthopnea, PND, anasarca, dizziness, palpitations.no Bilateral lower extremity swelling  GI:  No heartburn, indigestion, change in bowel habits, loss of appetite, melena, blood in stool, hematemesis Resp:  no shortness of breath at rest. No dyspnea on exertion, No excess mucus, no productive cough, No non-productive cough, No coughing up of blood.No change in color of mucus.No wheezing. Skin:  no rash or lesions. No jaundice GU:  no dysuria, change in color of urine, no urgency or frequency. No straining to urinate.  No flank pain.  Musculoskeletal:  No joint pain or no joint swelling. No decreased range of motion. No back pain.  Psych:  No change in mood or affect. No depression or anxiety. No memory loss.  Neuro: no localizing neurological complaints, no tingling, no weakness, no double vision, no gait abnormality, no slurred speech, no confusion  All systems reviewed and apart from [place] all are negative  Past Medical History:   Past Medical History:  Diagnosis Date  . Anemia   . Chronic kidney disease    kidney transplant 07  . Diabetes mellitus    Pt reports diagnosis in June 2011, Type 2  . GERD (gastroesophageal reflux disease)   . Hyperlipidemia   . Hypertension   . 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 Pyelonephritis 06/23/2014  . Seasonal allergies   . UTI (urinary tract infection) 01/09/2015  . 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   Qualifier: Diagnosis of  By: Carlena Sax  MD, Colletta [place]        Past Surgical History:  Procedure Laterality Date  . ARTERIOVENOUS GRAFT PLACEMENT Bilateral    "neither work"  (10/24/2017)  . AV FISTULA PLACEMENT Left 10/26/2018   Procedure: CREATION OF ARTERIOVENOUS FISTULA  LEFT ARM;  Surgeon: [name], MD;  Location: [facility];  Service: Vascular;  Laterality: Left;  . BASCILIC VEIN TRANSPOSITION Left 12/21/2018   Procedure: Left arm BASILIC VEIN TRANSPOSITION SECOND STAGE;  Surgeon: [name], MD;  Location: [facility];  Service: Vascular;  Laterality: Left;  . CHOLECYSTECTOMY N/A 06/30/2017   Procedure: LAPAROSCOPIC CHOLECYSTECTOMY WITH INTRAOPERATIVE CHOLANGIOGRAM;  Surgeon: Excell Seltzer, MD;  Location: [facility];  Service: General;  Laterality: N/A;  . ESOPHAGOGASTRODUODENOSCOPY (EGD) WITH PROPOFOL N/A 07/04/2017   Procedure: ESOPHAGOGASTRODUODENOSCOPY (EGD) WITH PROPOFOL;  Surgeon: Clarene Essex, MD;  Location: [facility];  Service: Endoscopy;  Laterality: N/A;  . INSERTION OF DIALYSIS CATHETER N/A 03/20/2018   Procedure: INSERTION OF TUNNELED DIALYSIS CATHETER - RIGHT INTERANL JUGULAR PLACEMENT;  Surgeon: Angelia Mould, MD;  Location: [facility];  Service: Vascular;  Laterality: N/A;  . IR GUIDED [place]  10/28/2018  . KIDNEY TRANSPLANT  2007  . PARATHYROIDECTOMY  ?2012   "3/4 removed" (10/24/2017)  . RENAL BIOPSY Bilateral 2003  . UPPER EXTREMITY VENOGRAPHY Bilateral 10/19/2018   Procedure: UPPER EXTREMITY VENOGRAPHY;  Surgeon: [name], MD;  Location: [facility];  Service: Cardiovascular;  Laterality: Bilateral;  Bilateral     Social History:  Ambulatory  Independently      reports that she has never smoked. She has never used smokeless tobacco. She reports that she does not drink alcohol or use drugs.     Family History:   Family History  Problem Relation Age of Onset  . Arthritis Mother   . Hypertension Mother   . Aneurysm Mother        died of brain aneurysm  . CAD Father        Has 3 stents  . Diabetes Father        borderline  . Other Brother        Died in war     Allergies: Allergies  Allergen Reactions  . Benadryl [Diphenhydramine-Zinc Acetate] Shortness Of Breath  . Diphenhydramine Hcl Anaphylaxis    REACTION: Stopped breathing in ICU  . Doxycycline Shortness Of Breath  . Motrin [Ibuprofen] Shortness Of Breath and Itching    Per pt  . Banana Other (See Comments)    Sick on the stomach  .  Iron Dextran Other (See Comments)    REACTION: vein irritation  . Shellfish Allergy Hives  . Chlorhexidine Itching     Prior to Admission medications   Medication Sig Start Date End Date Taking? Authorizing Provider  ACCU-CHEK SOFTCLIX LANCETS lancets Use to check blood sugar 4 times per day. 12/29/15   Renato Shin, MD  acetaminophen (TYLENOL) 500 MG tablet Take 1,000 mg by mouth every 6 (six) hours as needed for moderate pain (pain).     [provider]  b complex-vitamin c-folic acid (NEPHRO-VITE) 0.8 MG TABS tablet Take 1 tablet by mouth every Monday, Wednesday, and Friday. After dialysis on dialysis days 07/19/18   [provider]  calcium acetate (PHOSLO) 667 MG capsule Take 1,334 mg by mouth 2 (two) times daily. Taking 1 capsule with a snack 11/24/18   [provider]  Calcium Carbonate Antacid (CALCIUM CARBONATE, DOSED IN MG ELEMENTAL CALCIUM,) 1250 MG/5ML SUSP Take 5 mLs by mouth daily. 11/17/18   [provider]  carvedilol (COREG) 12.5 MG tablet Take 12.5 mg by mouth in the morning and at bedtime. 03/27/18   [provider]  cetirizine (ZYRTEC) 10 MG tablet Take 10 mg by mouth daily as needed for allergies.    [provider]  D 1000 25 MCG (1000 UT) capsule Take 1,000 Units by mouth daily. 04/27/19   [provider]  diclofenac Sodium (VOLTAREN) 1 % GEL Apply 2 g topically 3 (three) times daily as needed (pain).  04/19/19   [provider]  dicyclomine (BENTYL) 20 MG tablet Take 1 tablet (20 mg total) by mouth 2 (two) times daily. Patient taking differently: Take 20 mg by mouth 2  (two) times daily as needed (stomach issues.).  02/06/18   [name], [name], MD  esomeprazole (NEXIUM) 40 MG capsule Take 40 mg by mouth every morning. 06/07/19   [provider]  fluticasone (FLONASE) 50 MCG/ACT nasal spray Place 2 sprays into both nostrils daily as needed for allergies. 09/03/18   [name], MD  gabapentin (NEURONTIN) 400 MG capsule Take 400 mg by mouth 3 (three) times daily. 04/29/19   [provider]  HYDROcodone-acetaminophen (NORCO) 5-325 MG tablet Take 1 tablet by mouth every 6 (six) hours as needed for moderate pain. Patient not taking: Reported on 06/15/2019 12/21/18   [name], PA-C  hydrOXYzine (ATARAX/VISTARIL) 50 MG tablet Take 1 tablet (50 mg total) by mouth 2 (two) times daily as needed for itching. 02/06/18   [name], Ripudeep K, MD  insulin aspart (NOVOLOG FLEXPEN) 100 UNIT/ML FlexPen Inject 2-10 Units into the skin See admin instructions. as needed for blood sugar management (sliding scale)    [provider]  insulin degludec (TRESIBA) 100 UNIT/ML SOPN FlexTouch Pen Inject 0.6 mLs (60 Units total) into the skin 2 (two) times daily. Reports taking 24 units QAM Patient taking differently: Inject 20 Units into the skin daily.  03/06/18   Charlynne Cousins, MD  loperamide (IMODIUM) 2 MG capsule Take 2 mg by mouth 4 (four) times daily as needed for diarrhea or loose stools.     [provider]  metoCLOPramide (REGLAN) 10 MG tablet Take 1 tablet (10 mg total) by mouth 3 (three) times daily before meals. Patient taking differently: Take 10 mg by mouth 3 (three) times daily as needed (stomach issues.).  02/06/18   [name], [name], MD  metoprolol tartrate (LOPRESSOR) 25 MG tablet Take 1 tablet (25 mg total) by mouth 2 (two) times daily. 02/07/19 06/15/19  Wyvonnia Dusky, MD  SANTYL ointment Apply 1 application topically daily. 11/18/18   [provider]  simvastatin (ZOCOR) 20 MG tablet Take 20 mg by mouth at bedtime.      [provider]  amLODipine (NORVASC) 10 MG tablet Take 1 tablet (10 mg total) by mouth daily. 03/25/18 02/07/19  Thurnell Lose, MD   Physical Exam: Blood pressure (!) 196/125, pulse (!) 107, temperature 97.7 F (36.5 C), temperature source Oral, resp. rate 13, SpO2 100 %. 1. General:  in No Acute distress   Chronically ill -appearing 2. Psychological: Alert and  Oriented 3. Head/ENT:    Dry Mucous Membranes                          Head Non traumatic, neck supple                            Poor Dentition 4. SKIN:   decreased Skin turgor,  Skin clean Dry and intact no rash 5. Heart: Regular rate and rhythm no  Murmur, no Rub or gallop 6. Lungs:  no wheezes or crackles   7. Abdomen: Soft, generalized tenderness, Non distended   Obese sounds present 8. Lower extremities: no clubbing, cyanosis, no  edema 9. Neurologically Grossly intact, moving all 4 extremities equally   10. MSK: Normal range of motion   All other LABS:     Recent Labs  Lab 06/15/19 0735 06/15/19 1427 06/16/19 1439 06/16/19 1647  WBC 6.9 6.1 9.2  --   HGB 10.0* 9.7* 13.2 11.6*  HCT 31.9* 30.5* 40.6 34.0*  MCV 100.6* 100.7* 99.0  --   PLT 185 159 180  --      Recent Labs  Lab 06/15/19 0735 06/15/19 0735 06/15/19 0956 06/15/19 1427 06/15/19 1501 06/16/19 1439 06/16/19 1647  NA 135  --   --  140  --  137 138  K 6.7*   < > 7.2* 4.8 3.4* 5.3* 5.2*  CL 93*  --   --  98  --  93*  --   CO2 24  --   --  24  --  26  --   GLUCOSE 236*  --   --  281*  --  387*  --   BUN 53*  --   --  59*  --  37*  --   CREATININE 9.39*  --   --  10.34*  --  7.42*  --   CALCIUM 6.5*  --   --  6.8*  --  9.5  --   PHOS  --   --   --  7.0*  --   --   --    < > = values in this interval not displayed.     Recent Labs  Lab 06/15/19 0735 06/15/19 1427 06/16/19 1439  AST 55*  --  27  ALT 17  --  20  ALKPHOS 53  --  75  BILITOT 0.4  --  0.8  PROT 7.1  --  9.9*  ALBUMIN 3.5 3.2* 4.5       Cultures:     Component Value Date/Time   SDES ABSCESS 10/28/2018 1335   SPECREQUEST NONE 10/28/2018 1335   CULT  10/28/2018 1335    FEW NORMAL SKIN FLORA NO ANAEROBES ISOLATED Performed at [facility], [place] [address], [place], [place] [address]    REPTSTATUS 11/02/2018  FINAL 10/28/2018 1335     Radiological Exams on Admission: CT ABDOMEN PELVIS W CONTRAST  Result Date: 06/16/2019 CLINICAL DATA:  Bowel obstruction suspected, right lower quadrant abdominal pain, dialysis EXAM: CT ABDOMEN AND PELVIS WITH CONTRAST TECHNIQUE: Multidetector CT imaging of the abdomen and pelvis was performed using the standard protocol following bolus administration of intravenous contrast. CONTRAST:  122mL OMNIPAQUE IOHEXOL 300 MG/ML  SOLN COMPARISON:  10/27/2018 FINDINGS: Lower chest: No acute abnormality. Hepatobiliary: No focal liver abnormality is seen. Hepatic steatosis. Status post cholecystectomy. No biliary dilatation. Pancreas: Unremarkable. No pancreatic ductal dilatation or surrounding inflammatory changes. Spleen: Normal in size without significant abnormality. Adrenals/Urinary Tract: Adrenal glands are unremarkable. Status post right nephrectomy. Very atrophic left kidney. Bladder is unremarkable. Stomach/Bowel: Stomach is within normal limits. Appendix appears normal. No evidence of bowel wall thickening, distention, or inflammatory changes. Vascular/Lymphatic: No significant vascular findings are present. No enlarged abdominal or pelvic lymph nodes. Reproductive: Status post hysterectomy. Other: No abdominal wall hernia or abnormality. No abdominopelvic ascites. 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 There are tiny, persistent air locules, of uncertain etiology, possibly communicating from adjacent bowel. Musculoskeletal: No acute or significant osseous findings.  IMPRESSION: 1.  No evidence of bowel obstruction. 2. 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 There are tiny, persistent air locules, of uncertain etiology, possibly communicating from adjacent bowel. 3.  Hepatic steatosis. Electronically Signed   By: Eddie Candle M.D.   On: 06/16/2019 16:31   DG Chest Port 1 View  Result Date: 06/15/2019 CLINICAL DATA:  34 year old female with vomiting EXAM: PORTABLE CHEST 1 VIEW COMPARISON:  02/07/2019 FINDINGS: The cardiomediastinal silhouette unchanged in size and contour, borderline enlarged. Low lung volumes accentuating the interstitium. Linear opacity at the right lung base. No pneumothorax or large pleural effusion. No interlobular septal thickening. Interval removal of right IJ tunneled hemodialysis catheter. No displaced fracture IMPRESSION: Low lung volumes with atelectasis/scarring and no evidence of acute cardiopulmonary disease. Electronically Signed   By: Corrie Mckusick D.O.   On: 06/15/2019 08:04    Chart has been reviewed    Assessment/Plan  34 y.o. female with medical history significant of ESRD on HD, DM1, gastroparesis, sp renal transplant with failure with inta-abdominal abcess, anemia, GERD, HLD, HTN, Pseudoseizures  Admitted for intractable nausea and vomiting  Present on Admission: Abdominal pain - possibly some chronic component, CT abd appears improved, although still some residual small pocket of air.  No evidence of infectious process we will continue to monitor Try to avoid IV narcotics . Gastroparesis -avoid narcotics IV Reglan as needed and Zofran  . Anemia due to chronic kidney disease - currently stable   . Essential hypertension, benign restart home medications if able to tolerate Otherwise as needed hydralazine IV, may also attempt to use clonidine if able to tolerate  . Type 1  diabetes mellitus with complication, uncontrolled (HCC) -restart home insulin in order sliding scale repeat labs and monitor for any sign of DKA  . Nausea & vomiting in the setting of history of gastroparesis supportive management  End-stage renal disease discussed with nephrology today,  aware may need hemodialysis if still here on Monday or if need emergent hemodialysis on Sunday.   Other plan as per orders.  DVT prophylaxis:  Hep [place]     Code Status:  FULL CODE  as per patient   I had personally discussed CODE STATUS with patient   Family Communication:   Family not at  Bedside    Disposition Plan:    To home once workup is complete and patient is stable   Following barriers for discharge:                            Electrolytes corrected                            Nausea/vomitting controlled with PO medications                                                           Will need to be able to tolerate PO                             EXPECT DC tomorrow                                     Consults called: Dr. Soyla Murphy with nephrology made aware IF is Here still on MOnday May need HD    Admission status:  ED Disposition    ED Disposition Condition [place]: [place] [[id number]]  Level of Care: Telemetry Medical [104]  I expect the patient will be discharged within 24 hours: No (not a candidate for 5C-Observation unit)  Covid Evaluation: Asymptomatic Screening Protocol (No Symptoms)  Diagnosis: Gastroparesis [536.3.ICD-9-CM]  Admitting Physician: Toy Baker [[id number]]  Attending Physician: Toy Baker [[id number]]       Obs      Level of care   tele  For 12H    Precautions: admitted as  asymptomatic screening protocol  PPE: Used by the provider:   P100  eye Goggles,  Gloves    Lacreasha Hinds 06/16/2019, 8:56 PM    [facility]     after 2 AM please page floor coverage PA If 7AM-7PM, please contact the day  team taking care of the patient using [email or web address]   Patient was evaluated in the context of the global COVID-19 pandemic, which necessitated consideration that the patient might be at risk for infection with the SARS-CoV-2 virus that causes COVID-19. Institutional protocols and algorithms that pertain to the evaluation of patients at risk for COVID-19 are in a state of rapid change based on information released by regulatory bodies including the CDC and federal and state organizations. These policies and algorithms were followed during the patient's care.

## 2019-06-16 NOTE — Progress Notes (Signed)
New Admission Note: 06/16/2019  Arrival Method: stretcher Mental Orientation:alert and oriented x 4 Telemetry: 5M01 ST, 2nd verification completed Assessment: completed Skin: 2 RNs checked. No issues  IV: RW NSL Pain: See pain assessment Safety Measures: Bed in low position, call light in reach. Admission: Completed [facility] [place] Orientation: Patient has been orientated to the room, unit and staff.    Orders have been reviewed and implemented. Will continue to monitor the patient.

## 2019-06-16 NOTE — ED Notes (Signed)
Provider at bedside

## 2019-06-16 NOTE — ED Provider Notes (Signed)
[facility] EMERGENCY DEPARTMENT Provider Note   CSN: [id number] Arrival date & time: 06/16/19  1333     History Chief Complaint  Patient presents with  . Abdominal Pain    KHAYLA KOPPENHAVER is a 34 y.o. female.  HPI 34 year old female history of chronic kidney disease, on dialysis, last dialyzed yesterday, type 1 diabetes, gastroparesis presents today complaining of nausea vomiting and abdominal pain.  She was seen here yesterday for similar symptoms.  At that time she had missed her dialysis and was sent to dialysis.  She received Reglan in the ED and felt somewhat better is subsequently discharged.  Today she presents saying that she has had ongoing nausea and vomiting since midnight of yesterday.  She complains of diffuse abdominal pain.  She denies chills but feels like she may have had some subjective fever.  She has had some loose bowel movements.  She states this is like similar episodes of her GI distress.  She states that she does not have regular periods and has never been pregnant.    Past Medical History:  Diagnosis Date  . Anemia   . Chronic kidney disease    kidney transplant 07  . Diabetes mellitus    Pt reports diagnosis in June 2011, Type 2  . GERD (gastroesophageal reflux disease)   . Hyperlipidemia   . Hypertension   . 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 Pyelonephritis 06/23/2014  . Seasonal allergies   . UTI (urinary tract infection) 01/09/2015  . 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 Intra-abdominal abscess ([place]) 10/28/2018  . Hypertensive emergency 09/01/2018  . DKA (diabetic ketoacidosis) ([place]) 05/01/2018  . ESRD on hemodialysis ([place]) 05/01/2018  . Renal failure 03/18/2018  . Cellulitis of left leg 03/01/2018  .  Hyperlipidemia 03/01/2018  . Hypocalcemia 03/01/2018  . Metabolic acidosis 20/94/7096  . Hypokalemia 02/03/2018  . Incontinence of bowel 02/03/2018  . Chronic pain 11/11/2017  . Gastroparesis   . Constipation 07/13/2017  . Hematemesis 07/02/2017  . Chronic cholecystitis 06/29/2017  . Type 1 diabetes mellitus with complication, uncontrolled ([place]) 05/25/2015  . Nausea & vomiting 01/09/2015  . Renal transplant recipient   . Immunosuppressed status ([place])   . 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 Chronic kidney disease 01/04/2010  . Chronic kidney disease, stage II (mild) 01/04/2010  . OVARIAN FAILURE, PREMATURE 03/11/2009  . 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 OBESITY 09/25/2007  . Anemia due to chronic kidney disease 09/25/2007  . LEARNING DISABILITY 09/25/2007  . Essential hypertension, benign 09/25/2007    Past Surgical History:  Procedure Laterality Date  . ARTERIOVENOUS GRAFT PLACEMENT Bilateral    "neither work" (10/24/2017)  . AV FISTULA PLACEMENT Left 10/26/2018   Procedure: CREATION OF ARTERIOVENOUS FISTULA  LEFT ARM;  Surgeon: [name], MD;  Location: [facility];  Service: Vascular;  Laterality: Left;  . BASCILIC VEIN TRANSPOSITION Left 12/21/2018   Procedure: Left arm BASILIC VEIN TRANSPOSITION SECOND STAGE;  Surgeon: [name], MD;  Location: [facility];  Service: Vascular;  Laterality: Left;  . CHOLECYSTECTOMY N/A 06/30/2017   Procedure: LAPAROSCOPIC CHOLECYSTECTOMY WITH INTRAOPERATIVE CHOLANGIOGRAM;  Surgeon: Excell Seltzer,  MD;  Location: [facility];  Service: General;  Laterality: N/A;  . ESOPHAGOGASTRODUODENOSCOPY (EGD) WITH PROPOFOL N/A 07/04/2017   Procedure: ESOPHAGOGASTRODUODENOSCOPY (EGD) WITH PROPOFOL;  Surgeon: Clarene Essex, MD;  Location: [facility];  Service: Endoscopy;  Laterality: N/A;  . INSERTION OF DIALYSIS CATHETER N/A 03/20/2018    Procedure: INSERTION OF TUNNELED DIALYSIS CATHETER - RIGHT INTERANL JUGULAR PLACEMENT;  Surgeon: Angelia Mould, MD;  Location: [facility];  Service: Vascular;  Laterality: N/A;  . IR GUIDED [place]  10/28/2018  . KIDNEY TRANSPLANT  2007  . PARATHYROIDECTOMY  ?2012   "3/4 removed" (10/24/2017)  . RENAL BIOPSY Bilateral 2003  . UPPER EXTREMITY VENOGRAPHY Bilateral 10/19/2018   Procedure: UPPER EXTREMITY VENOGRAPHY;  Surgeon: [name], MD;  Location: [facility];  Service: Cardiovascular;  Laterality: Bilateral;  Bilateral      OB History   No obstetric history on file.     Family History  Problem Relation Age of Onset  . Arthritis Mother   . Hypertension Mother   . Aneurysm Mother        died of brain aneurysm  . CAD Father        Has 3 stents  . Diabetes Father        borderline  . Other Brother        Died in war    Social History   Tobacco Use  . Smoking status: Never Smoker  . Smokeless tobacco: Never Used  Substance Use Topics  . Alcohol use: No  . Drug use: No    Home Medications Prior to Admission medications   Medication Sig Start Date End Date Taking? Authorizing Provider  ACCU-CHEK SOFTCLIX LANCETS lancets Use to check blood sugar 4 times per day. 12/29/15   Renato Shin, MD  acetaminophen (TYLENOL) 500 MG tablet Take 1,000 mg by mouth every 6 (six) hours as needed for moderate pain (pain).     [provider]  b complex-vitamin c-folic acid (NEPHRO-VITE) 0.8 MG TABS tablet Take 1 tablet by mouth every Monday, Wednesday, and Friday. After dialysis on dialysis days 07/19/18   [provider]  calcium acetate (PHOSLO) 667 MG capsule Take 1,334 mg by mouth 2 (two) times daily. Taking 1 capsule with a snack 11/24/18   [provider]  Calcium Carbonate Antacid (CALCIUM CARBONATE, DOSED IN MG ELEMENTAL CALCIUM,) 1250 MG/5ML SUSP Take 5 mLs by mouth daily. 11/17/18   [provider]  carvedilol  (COREG) 12.5 MG tablet Take 12.5 mg by mouth in the morning and at bedtime. 03/27/18   [provider]  cetirizine (ZYRTEC) 10 MG tablet Take 10 mg by mouth daily as needed for allergies.    [provider]  D 1000 25 MCG (1000 UT) capsule Take 1,000 Units by mouth daily. 04/27/19   [provider]  diclofenac Sodium (VOLTAREN) 1 % GEL Apply 2 g topically 3 (three) times daily as needed (pain).  04/19/19   [provider]  dicyclomine (BENTYL) 20 MG tablet Take 1 tablet (20 mg total) by mouth 2 (two) times daily. Patient taking differently: Take 20 mg by mouth 2 (two) times daily as needed (stomach issues.).  02/06/18   [name], [name], MD  esomeprazole (NEXIUM) 40 MG capsule Take 40 mg by mouth every morning. 06/07/19   [provider]  fluticasone (FLONASE) 50 MCG/ACT nasal spray Place 2 sprays into both nostrils daily as needed for allergies. 09/03/18   [name], MD  gabapentin (NEURONTIN) 400 MG capsule Take 400 mg by mouth 3 (three) times daily. 04/29/19   [provider]  HYDROcodone-acetaminophen (NORCO) 5-325 MG tablet Take 1 tablet by mouth every 6 (six) hours as needed for moderate pain. Patient not taking: Reported on 06/15/2019 12/21/18   [name], PA-C  hydrOXYzine (ATARAX/VISTARIL) 50 MG tablet Take 1 tablet (50 mg total) by mouth 2 (two) times daily as needed for itching. 02/06/18   [name], Ripudeep K, MD  insulin aspart (NOVOLOG FLEXPEN) 100 UNIT/ML FlexPen Inject 2-10 Units into the skin See admin instructions. as needed for blood sugar management (sliding scale)    [provider]  insulin degludec (TRESIBA) 100 UNIT/ML SOPN FlexTouch Pen Inject 0.6 mLs (60 Units total) into the skin 2 (two) times daily. Reports taking 24 units QAM Patient taking differently: Inject 20 Units into the skin daily.  03/06/18   Charlynne Cousins, MD  loperamide (IMODIUM) 2 MG capsule Take 2 mg by mouth 4 (four) times daily as needed for  diarrhea or loose stools.     [provider]  metoCLOPramide (REGLAN) 10 MG tablet Take 1 tablet (10 mg total) by mouth 3 (three) times daily before meals. Patient taking differently: Take 10 mg by mouth 3 (three) times daily as needed (stomach issues.).  02/06/18   [name], [name], MD  metoprolol tartrate (LOPRESSOR) 25 MG tablet Take 1 tablet (25 mg total) by mouth 2 (two) times daily. 02/07/19 06/15/19  Wyvonnia Dusky, MD  SANTYL ointment Apply 1 application topically daily. 11/18/18   [provider]  simvastatin (ZOCOR) 20 MG tablet Take 20 mg by mouth at bedtime.     [provider]  amLODipine (NORVASC) 10 MG tablet Take 1 tablet (10 mg total) by mouth daily. 03/25/18 02/07/19  Thurnell Lose, MD    Allergies    Benadryl [diphenhydramine-zinc acetate], Diphenhydramine hcl, Doxycycline, Motrin [ibuprofen], Banana, Iron dextran, Shellfish allergy, and Chlorhexidine  Review of Systems   Review of Systems  All other systems reviewed and are negative.   Physical Exam Updated Vital Signs BP (!) 152/112 (BP Location: Right Arm)   Pulse (!) 104   Temp 97.7 F (36.5 C) (Oral)   Resp (!) 22   SpO2 100%   Physical Exam Vitals and nursing note reviewed.  Constitutional:      General: She is in acute distress.     Appearance: She is well-developed. She is not ill-appearing.  HENT:     Head: Normocephalic.     Mouth/Throat:     Mouth: Mucous membranes are moist.  Cardiovascular:     Rate and Rhythm: Regular rhythm. Tachycardia present.  Abdominal:     General: Abdomen is flat. Bowel sounds are normal.     Palpations: Abdomen is soft.     Tenderness: There is generalized abdominal tenderness.     Comments: Scars present from prior kidney transplant and rejection  Skin:    General: Skin is warm and dry.     Capillary Refill: Capillary refill takes less than 2 seconds.  Neurological:     General: No focal deficit present.     Mental Status: She is  alert.  Psychiatric:        Mood and Affect: Mood is anxious.     ED Results / Procedures / Treatments   Labs (all labs ordered are listed, but only abnormal results are displayed) Labs Reviewed  CBC  COMPREHENSIVE METABOLIC PANEL    EKG None  Radiology DG Chest Port 1 View  Result Date: 06/15/2019 CLINICAL DATA:  34 year old female with vomiting EXAM: PORTABLE CHEST 1 VIEW COMPARISON:  02/07/2019 FINDINGS: The cardiomediastinal silhouette unchanged in size and contour, borderline enlarged. Low lung volumes accentuating the interstitium. Linear opacity at the right lung base. No pneumothorax or large pleural effusion. No interlobular septal thickening. Interval removal of right IJ tunneled hemodialysis catheter. No displaced fracture IMPRESSION: Low lung volumes with atelectasis/scarring and no evidence of acute cardiopulmonary disease. Electronically Signed   By: Corrie Mckusick D.O.   On: 06/15/2019 08:04    Procedures Procedures (including critical care time)  Medications Ordered in ED Medications  metoCLOPramide (REGLAN) injection 10 mg (has no administration in time range)    ED Course  I have reviewed the triage vital signs and the nursing notes.  Pertinent labs & imaging results that were available during my care of the patient were reviewed by me and considered in my medical decision making (see chart for details).    MDM Rules/Calculators/A&P                      34 year old female type 1 diabetes, presents today complaining of nausea and vomiting.  She was seen here for similar symptoms yesterday.  At that time, she was dialyzed and treated with Reglan and felt improved.  However symptoms have since returned.  I reviewed her chart and did not see any recent admissions during the past year for abdominal pain.  She has been admitted in the past for DKA.  She will be evaluated for her abdominal pain, given IV fluid judiciously and Reglan.  Additionally, labs will be  checked to make sure that she does not have DKA.  CT is ordered and pending.  Patient has been given Reglan.  Anion gap 18-plan venous ph  Discussed her care with Dr. Ralene Bathe and she will follow-up labs and CT. Final Clinical Impression(s) / ED Diagnoses Final diagnoses:  None    Rx / DC Orders ED Discharge Orders    None       Pattricia Boss, MD 06/17/19 [phone]

## 2019-06-16 NOTE — ED Notes (Signed)
Provider made aware of elevated BP; awaiting further orders at this time.

## 2019-06-17 ENCOUNTER — Encounter (HOSPITAL_COMMUNITY): Payer: Self-pay | Admitting: Internal Medicine

## 2019-06-17 DIAGNOSIS — K219 Gastro-esophageal reflux disease without esophagitis: Secondary | ICD-10-CM | POA: Diagnosis present

## 2019-06-17 DIAGNOSIS — E785 Hyperlipidemia, unspecified: Secondary | ICD-10-CM | POA: Diagnosis present

## 2019-06-17 DIAGNOSIS — Z992 Dependence on renal dialysis: Secondary | ICD-10-CM

## 2019-06-17 DIAGNOSIS — E1043 Type 1 diabetes mellitus with diabetic autonomic (poly)neuropathy: Secondary | ICD-10-CM | POA: Diagnosis not present

## 2019-06-17 DIAGNOSIS — R1110 Vomiting, unspecified: Secondary | ICD-10-CM | POA: Diagnosis not present

## 2019-06-17 DIAGNOSIS — Z833 Family history of diabetes mellitus: Secondary | ICD-10-CM

## 2019-06-17 DIAGNOSIS — N2581 Secondary hyperparathyroidism of renal origin: Secondary | ICD-10-CM | POA: Diagnosis present

## 2019-06-17 DIAGNOSIS — E1022 Type 1 diabetes mellitus with diabetic chronic kidney disease: Secondary | ICD-10-CM | POA: Diagnosis present

## 2019-06-17 DIAGNOSIS — E8889 Other specified metabolic disorders: Secondary | ICD-10-CM | POA: Diagnosis present

## 2019-06-17 DIAGNOSIS — F819 Developmental disorder of scholastic skills, unspecified: Secondary | ICD-10-CM | POA: Diagnosis present

## 2019-06-17 DIAGNOSIS — K3184 Gastroparesis: Secondary | ICD-10-CM | POA: Diagnosis not present

## 2019-06-17 DIAGNOSIS — Z90710 Acquired absence of both cervix and uterus: Secondary | ICD-10-CM

## 2019-06-17 DIAGNOSIS — N186 End stage renal disease: Secondary | ICD-10-CM | POA: Diagnosis not present

## 2019-06-17 DIAGNOSIS — E860 Dehydration: Secondary | ICD-10-CM | POA: Diagnosis present

## 2019-06-17 DIAGNOSIS — Z87440 Personal history of urinary (tract) infections: Secondary | ICD-10-CM

## 2019-06-17 DIAGNOSIS — E1065 Type 1 diabetes mellitus with hyperglycemia: Secondary | ICD-10-CM | POA: Diagnosis present

## 2019-06-17 DIAGNOSIS — Z20822 Contact with and (suspected) exposure to covid-19: Secondary | ICD-10-CM | POA: Diagnosis present

## 2019-06-17 DIAGNOSIS — E875 Hyperkalemia: Secondary | ICD-10-CM

## 2019-06-17 DIAGNOSIS — K920 Hematemesis: Secondary | ICD-10-CM | POA: Diagnosis present

## 2019-06-17 DIAGNOSIS — Z794 Long term (current) use of insulin: Secondary | ICD-10-CM

## 2019-06-17 DIAGNOSIS — D631 Anemia in chronic kidney disease: Secondary | ICD-10-CM | POA: Diagnosis present

## 2019-06-17 DIAGNOSIS — I10 Essential (primary) hypertension: Secondary | ICD-10-CM | POA: Diagnosis not present

## 2019-06-17 DIAGNOSIS — R000 Tachycardia, unspecified: Secondary | ICD-10-CM | POA: Diagnosis present

## 2019-06-17 DIAGNOSIS — Z905 Acquired absence of kidney: Secondary | ICD-10-CM

## 2019-06-17 DIAGNOSIS — Z940 Kidney transplant status: Secondary | ICD-10-CM

## 2019-06-17 DIAGNOSIS — Z79899 Other long term (current) drug therapy: Secondary | ICD-10-CM

## 2019-06-17 DIAGNOSIS — Q970 Karyotype 47, XXX: Secondary | ICD-10-CM

## 2019-06-17 DIAGNOSIS — I120 Hypertensive chronic kidney disease with stage 5 chronic kidney disease or end stage renal disease: Secondary | ICD-10-CM | POA: Diagnosis present

## 2019-06-17 DIAGNOSIS — Z8249 Family history of ischemic heart disease and other diseases of the circulatory system: Secondary | ICD-10-CM

## 2019-06-17 DIAGNOSIS — Z91013 Allergy to seafood: Secondary | ICD-10-CM

## 2019-06-17 DIAGNOSIS — J302 Other seasonal allergic rhinitis: Secondary | ICD-10-CM | POA: Diagnosis present

## 2019-06-17 DIAGNOSIS — Z8261 Family history of arthritis: Secondary | ICD-10-CM

## 2019-06-17 LAB — CBC
HCT: 34.2 % — ABNORMAL LOW (ref 36.0–46.0)
Hemoglobin: 10.8 g/dL — ABNORMAL LOW (ref 12.0–15.0)
MCH: 32 pg (ref 26.0–34.0)
MCHC: 31.6 g/dL (ref 30.0–36.0)
MCV: 101.2 fL — ABNORMAL HIGH (ref 80.0–100.0)
Platelets: 181 10*3/uL (ref 150–400)
RBC: 3.38 MIL/uL — ABNORMAL LOW (ref 3.87–5.11)
RDW: 18 % — ABNORMAL HIGH (ref 11.5–15.5)
WBC: 9.9 10*3/uL (ref 4.0–10.5)
nRBC: 0 % (ref 0.0–0.2)

## 2019-06-17 LAB — COMPREHENSIVE METABOLIC PANEL
ALT: 14 U/L (ref 0–44)
AST: 19 U/L (ref 15–41)
Albumin: 3.8 g/dL (ref 3.5–5.0)
Alkaline Phosphatase: 58 U/L (ref 38–126)
Anion gap: 17 — ABNORMAL HIGH (ref 5–15)
BUN: 52 mg/dL — ABNORMAL HIGH (ref 6–20)
CO2: 25 mmol/L (ref 22–32)
Calcium: 8 mg/dL — ABNORMAL LOW (ref 8.9–10.3)
Chloride: 95 mmol/L — ABNORMAL LOW (ref 98–111)
Creatinine, Ser: 8.26 mg/dL — ABNORMAL HIGH (ref 0.44–1.00)
GFR calc Af Amer: 7 mL/min — ABNORMAL LOW (ref >60)
GFR calc non Af Amer: 6 mL/min — ABNORMAL LOW (ref >60)
Glucose, Bld: 179 mg/dL — ABNORMAL HIGH (ref 70–99)
Potassium: 5.8 mmol/L — ABNORMAL HIGH (ref 3.5–5.1)
Sodium: 137 mmol/L (ref 135–145)
Total Bilirubin: 0.8 mg/dL (ref 0.3–1.2)
Total Protein: 8.1 g/dL (ref 6.5–8.1)

## 2019-06-17 LAB — HIV ANTIBODY (ROUTINE TESTING W REFLEX): HIV Screen 4th Generation wRfx: NONREACTIVE

## 2019-06-17 LAB — MAGNESIUM: Magnesium: 2.2 mg/dL (ref 1.7–2.4)

## 2019-06-17 LAB — GLUCOSE, CAPILLARY
Glucose-Capillary: 108 mg/dL — ABNORMAL HIGH (ref 70–99)
Glucose-Capillary: 112 mg/dL — ABNORMAL HIGH (ref 70–99)
Glucose-Capillary: 208 mg/dL — ABNORMAL HIGH (ref 70–99)

## 2019-06-17 LAB — PHOSPHORUS: Phosphorus: 6.5 mg/dL — ABNORMAL HIGH (ref 2.5–4.6)

## 2019-06-17 LAB — HEMOGLOBIN A1C
Hgb A1c MFr Bld: 9.2 % — ABNORMAL HIGH (ref 4.8–5.6)
Mean Plasma Glucose: 217.34 mg/dL

## 2019-06-17 MED ORDER — SODIUM CHLORIDE 0.9 % IV BOLUS
250.0000 mL | Freq: Once | INTRAVENOUS | Status: AC
  Administered 2019-06-17: 250 mL via INTRAVENOUS

## 2019-06-17 MED ORDER — SODIUM ZIRCONIUM CYCLOSILICATE 10 G PO PACK
10.0000 g | PACK | Freq: Three times a day (TID) | ORAL | Status: DC
  Administered 2019-06-17: 10 g via ORAL
  Filled 2019-06-17: qty 1

## 2019-06-17 NOTE — Discharge Summary (Signed)
Discharge Summary  [name] DPO:[id number] DOB: 05/29/1985  PCP: [name], MD  Admit date: 06/16/2019 Discharge date: 06/17/2019  Time spent: 25 minutes  Recommendations for Outpatient Follow-up:  1. Patient will follow up with her scheduled dialysis for tomorrow  Discharge Diagnoses:  Active Hospital Problems   Diagnosis Date Noted   ESRD on hemodialysis ([place]) 05/01/2018   Gastroparesis    Type 1 diabetes mellitus with complication, uncontrolled ([place]) 05/25/2015   Nausea & vomiting 01/09/2015   Anemia due to chronic kidney disease 09/25/2007   Essential hypertension, benign 09/25/2007    Resolved Hospital Problems  No resolved problems to display.    Discharge Condition: Improved, being discharged home  Diet recommendation: Carb modified, low-sodium  Vitals:   06/17/19 0638 06/17/19 0756  BP: (!) 82/58 97/62  Pulse:  86  Resp: 16   Temp:  (!) 97 F (36.1 C)  SpO2:  100%    History of present illness:  34 year old female with past medical history of end-stage renal disease on hemodialysis, diabetes mellitus, gastroparesis and hypertension presented to the emergency room on 4/3 with a potassium of 6.7.  Patient was hemodialyzed and was discharged home, but then returned with intractable nausea and vomiting and unable to tolerate any of her home medications.  This possibly felt to be similar to her previous gastroparesis.  Abdominal x-ray unremarkable.  Patient given IV fluids and antinausea medications and brought in for further evaluation.  Hospital Course:  Active Problems:   Anemia due to chronic kidney disease: Stable during his hospitalization.    Essential hypertension, benign: Blood pressure is a little bit soft this morning, but overall patient stable, should improve with p.o.    Nausea & vomiting: CT of abdomen pelvis noted near complete resolution of previous air/fluid collection.  With symptomatic treatment, patient's appetite is improved and  nausea have resolved.  She was able to tolerate p.o. and solid food for lunch and able to be discharged on 4/4.    Type 1 diabetes mellitus with complication, uncontrolled ([place]): CBGs monitored during hospitalization, stable.    Gastroparesis: Given IV Reglan.    ESRD on hemodialysis [facility]): Last received dialysis on Saturday 4/3 after missing it on 4/2.  Neck session scheduled for tomorrow, 4/5.  Hyperkalemia:  Patient noted to have a potassium this morning of 5.8, up from 5.3 from late last night.  Given Lokelma.   Procedures:  None  Consultations:  Nephrology  Discharge Exam: BP 97/62 (BP Location: Right Arm)    Pulse 86    Temp (!) 97 F (36.1 C) (Oral)    Resp 16    SpO2 100%   General: Alert and oriented x3, no acute distress Cardiovascular: Regular rate and rhythm, S1-S2 Respiratory: Clear to auscultation bilaterally  Discharge Instructions You were cared for by a hospitalist during your hospital stay. If you have any questions about your discharge medications or the care you received while you were in the hospital after you are discharged, you can call the unit and asked to speak with the hospitalist on call if the hospitalist that took care of you is not available. Once you are discharged, your primary care physician will handle any further medical issues. Please note that NO REFILLS for any discharge medications will be authorized once you are discharged, as it is imperative that you return to your primary care physician (or establish a relationship with a primary care physician if you do not have one) for your aftercare needs so  that they can reassess your need for medications and monitor your lab values.  Discharge Instructions    Diet - low sodium heart healthy   Complete by: As directed    Increase activity slowly   Complete by: As directed      Allergies as of 06/17/2019      Reactions   Benadryl [diphenhydramine-zinc Acetate] Shortness Of Breath   Diphenhydramine  Hcl Anaphylaxis   REACTION: Stopped breathing in ICU   Doxycycline Shortness Of Breath   Motrin [ibuprofen] Shortness Of Breath, Itching   Per pt   Banana Other (See Comments)   Sick on the stomach   Iron Dextran Other (See Comments)   REACTION: vein irritation   Shellfish Allergy Hives   Chlorhexidine Itching      Medication List    TAKE these medications   Accu-Chek Softclix Lancets lancets Use to check blood sugar 4 times per day.   acetaminophen 500 MG tablet Commonly known as: TYLENOL Take 1,000 mg by mouth every 6 (six) hours as needed for moderate pain (pain).   b complex-vitamin c-folic acid 0.8 MG Tabs tablet Take 1 tablet by mouth every Monday, Wednesday, and Friday. After dialysis on dialysis days   calcium acetate 667 MG capsule Commonly known as: PHOSLO Take 1,334 mg by mouth 2 (two) times daily. Taking 1 capsule with a snack   calcium carbonate (dosed in mg elemental calcium) 1250 MG/5ML Susp Take 5 mLs by mouth 3 (three) times daily.   cetirizine 10 MG tablet Commonly known as: ZYRTEC Take 10 mg by mouth daily as needed for allergies.   D 1000 25 MCG (1000 UT) capsule Generic drug: Cholecalciferol Take 1,000 Units by mouth every other day.   diclofenac Sodium 1 % Gel Commonly known as: VOLTAREN Apply 2 g topically 3 (three) times daily as needed (pain).   dicyclomine 20 MG tablet Commonly known as: BENTYL Take 1 tablet (20 mg total) by mouth 2 (two) times daily. What changed:   when to take this  reasons to take this   fluticasone 50 MCG/ACT nasal spray Commonly known as: FLONASE Place 2 sprays into both nostrils daily as needed for allergies.   gabapentin 400 MG capsule Commonly known as: NEURONTIN Take 400 mg by mouth 3 (three) times daily.   hydrOXYzine 50 MG tablet Commonly known as: ATARAX/VISTARIL Take 1 tablet (50 mg total) by mouth 2 (two) times daily as needed for itching.   insulin degludec 100 UNIT/ML FlexTouch Pen Commonly  known as: TRESIBA Inject 0.6 mLs (60 Units total) into the skin 2 (two) times daily. Reports taking 24 units QAM What changed:   how much to take  when to take this  additional instructions   loperamide 2 MG capsule Commonly known as: IMODIUM Take 2 mg by mouth 4 (four) times daily as needed for diarrhea or loose stools.   metoCLOPramide 10 MG tablet Commonly known as: REGLAN Take 1 tablet (10 mg total) by mouth 3 (three) times daily before meals. What changed:   when to take this  reasons to take this   metoprolol tartrate 25 MG tablet Commonly known as: LOPRESSOR Take 1 tablet (25 mg total) by mouth 2 (two) times daily. What changed: when to take this   NovoLOG FlexPen 100 UNIT/ML FlexPen Generic drug: insulin aspart Inject 2-10 Units into the skin 3 (three) times daily with meals. as needed for blood sugar management (sliding scale)   pantoprazole 40 MG tablet Commonly known as: PROTONIX Take  40 mg by mouth See admin instructions. Once to twice daily   Santyl ointment Generic drug: collagenase Apply 1 application topically daily.   simvastatin 20 MG tablet Commonly known as: ZOCOR Take 20 mg by mouth at bedtime.      Allergies  Allergen Reactions   Benadryl [Diphenhydramine-Zinc Acetate] Shortness Of Breath   Diphenhydramine Hcl Anaphylaxis    REACTION: Stopped breathing in ICU   Doxycycline Shortness Of Breath   Motrin [Ibuprofen] Shortness Of Breath and Itching    Per pt   Banana Other (See Comments)    Sick on the stomach   Iron Dextran Other (See Comments)    REACTION: vein irritation   Shellfish Allergy Hives   Chlorhexidine Itching   Follow-up Information    Hemodialysis as scheduled Follow up.            The results of significant diagnostics from this hospitalization (including imaging, microbiology, ancillary and laboratory) are listed below for reference.    Significant Diagnostic Studies: CT ABDOMEN PELVIS W  CONTRAST  Result Date: 06/16/2019 CLINICAL DATA:  Bowel obstruction suspected, right lower quadrant abdominal pain, dialysis EXAM: CT ABDOMEN AND PELVIS WITH CONTRAST TECHNIQUE: Multidetector CT imaging of the abdomen and pelvis was performed using the standard protocol following bolus administration of intravenous contrast. CONTRAST:  15mL OMNIPAQUE IOHEXOL 300 MG/ML  SOLN COMPARISON:  10/27/2018 FINDINGS: Lower chest: No acute abnormality. Hepatobiliary: No focal liver abnormality is seen. Hepatic steatosis. Status post cholecystectomy. No biliary dilatation. Pancreas: Unremarkable. No pancreatic ductal dilatation or surrounding inflammatory changes. Spleen: Normal in size without significant abnormality. Adrenals/Urinary Tract: Adrenal glands are unremarkable. Status post right nephrectomy. Very atrophic left kidney. Bladder is unremarkable. Stomach/Bowel: Stomach is within normal limits. Appendix appears normal. No evidence of bowel wall thickening, distention, or inflammatory changes. Vascular/Lymphatic: No significant vascular findings are present. No enlarged abdominal or pelvic lymph nodes. Reproductive: Status post hysterectomy. Other: No abdominal wall hernia or abnormality. No abdominopelvic ascites. 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 There are tiny, persistent air locules, of uncertain etiology, possibly communicating from adjacent bowel. Musculoskeletal: No acute or significant osseous findings. IMPRESSION: 1.  No evidence of bowel obstruction. 2. 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 There are tiny, persistent air locules, of uncertain etiology, possibly communicating from adjacent bowel. 3.  Hepatic steatosis.  Electronically Signed   By: Eddie Candle M.D.   On: 06/16/2019 16:31   DG Chest Port 1 View  Result Date: 06/15/2019 CLINICAL DATA:  34 year old female with vomiting EXAM: PORTABLE CHEST 1 VIEW COMPARISON:  02/07/2019 FINDINGS: The cardiomediastinal silhouette unchanged in size and contour, borderline enlarged. Low lung volumes accentuating the interstitium. Linear opacity at the right lung base. No pneumothorax or large pleural effusion. No interlobular septal thickening. Interval removal of right IJ tunneled hemodialysis catheter. No displaced fracture IMPRESSION: Low lung volumes with atelectasis/scarring and no evidence of acute cardiopulmonary disease. Electronically Signed   By: Corrie Mckusick D.O.   On: 06/15/2019 08:04    Microbiology: Recent Results (from the past 240 hour(s))  Respiratory Panel by RT PCR (Flu A&B, Covid) - Nasopharyngeal Swab     Status: None   Collection Time: 06/15/19 11:40 AM   Specimen: Nasopharyngeal Swab  Result Value  Ref Range Status   SARS Coronavirus 2 by RT PCR NEGATIVE NEGATIVE Final    Comment: (NOTE) SARS-CoV-2 target nucleic acids are NOT DETECTED. The SARS-CoV-2 RNA is generally detectable in upper respiratoy specimens during the acute phase of infection. The lowest concentration of SARS-CoV-2 viral copies this assay can detect is 131 copies/mL. A negative result does not preclude SARS-Cov-2 infection and should not be used as the sole basis for treatment or other patient management decisions. A negative result may occur with  improper specimen collection/handling, submission of specimen other than nasopharyngeal swab, presence of viral mutation(s) within the areas targeted by this assay, and inadequate number of viral copies (<131 copies/mL). A negative result must be combined with clinical observations, patient history, and epidemiological information. The expected result is Negative. Fact Sheet for Patients:    PinkCheek.be Fact Sheet for Healthcare Providers:  GravelBags.it This test is not yet ap proved or cleared by the [place] FDA and  has been authorized for detection and/or diagnosis of SARS-CoV-2 by FDA under an Emergency Use Authorization (EUA). This EUA will remain  in effect (meaning this test can be used) for the duration of the COVID-19 declaration under Section 564(b)(1) of the Act, 21 U.S.C. section 360bbb-3(b)(1), unless the authorization is terminated or revoked sooner.    Influenza A by PCR NEGATIVE NEGATIVE Final   Influenza B by PCR NEGATIVE NEGATIVE Final    Comment: (NOTE) The Xpert Xpress SARS-CoV-2/FLU/RSV assay is intended as an aid in  the diagnosis of influenza from Nasopharyngeal swab specimens and  should not be used as a sole basis for treatment. Nasal washings and  aspirates are unacceptable for Xpert Xpress SARS-CoV-2/FLU/RSV  testing. Fact Sheet for Patients: PinkCheek.be Fact Sheet for Healthcare Providers: GravelBags.it This test is not yet approved or cleared by the [place] FDA and  has been authorized for detection and/or diagnosis of SARS-CoV-2 by  FDA under an Emergency Use Authorization (EUA). This EUA will remain  in effect (meaning this test can be used) for the duration of the  Covid-19 declaration under Section 564(b)(1) of the Act, 21  U.S.C. section 360bbb-3(b)(1), unless the authorization is  terminated or revoked. Performed at [facility], [place] [address], [place], [place] [address]      Labs: Basic Metabolic Panel: Recent Labs  Lab 06/15/19 0735 06/15/19 0956 06/15/19 1427 06/15/19 1501 06/16/19 1439 06/16/19 1647 06/17/19 0234  NA 135  --  140  --  137 138 137  K 6.7*   < > 4.8 3.4* 5.3* 5.2* 5.8*  CL 93*  --  98  --  93*  --  95*  CO2 24  --  24  --  26  --  25  GLUCOSE 236*  --  281*  --   387*  --  179*  BUN 53*  --  59*  --  37*  --  52*  CREATININE 9.39*  --  10.34*  --  7.42*  --  8.26*  CALCIUM 6.5*  --  6.8*  --  9.5  --  8.0*  MG  --   --   --   --   --   --  2.2  PHOS  --   --  7.0*  --   --   --  6.5*   < > = values in this interval not displayed.   Liver Function Tests: Recent Labs  Lab 06/15/19 0735 06/15/19 1427 06/16/19 1439 06/17/19 0234  AST  55*  --  27 19  ALT 17  --  20 14  ALKPHOS 53  --  75 58  BILITOT 0.4  --  0.8 0.8  PROT 7.1  --  9.9* 8.1  ALBUMIN 3.5 3.2* 4.5 3.8   Recent Labs  Lab 06/15/19 0735  LIPASE 36   No results for input(s): AMMONIA in the last 168 hours. CBC: Recent Labs  Lab 06/15/19 0735 06/15/19 1427 06/16/19 1439 06/16/19 1647 06/17/19 0234  WBC 6.9 6.1 9.2  --  9.9  HGB 10.0* 9.7* 13.2 11.6* 10.8*  HCT 31.9* 30.5* 40.6 34.0* 34.2*  MCV 100.6* 100.7* 99.0  --  101.2*  PLT 185 159 180  --  181   Cardiac Enzymes: Recent Labs  Lab 06/16/19 2121  CKTOTAL 61   BNP: BNP (last 3 results) Recent Labs    09/01/18 1324  BNP 329.5*    ProBNP (last 3 results) No results for input(s): PROBNP in the last 8760 hours.  CBG: Recent Labs  Lab 06/15/19 0737 06/16/19 2231 06/17/19 0414 06/17/19 0713 06/17/19 1111  GLUCAP 235* 335* 108* 112* 208*       Signed:  Annita Brod, MD [facility] 06/17/2019, 3:10 PM

## 2019-06-17 NOTE — Plan of Care (Signed)
  Problem: Education: Goal: Knowledge of General Education information will improve Description: Including pain rating scale, medication(s)/side effects and non-pharmacologic comfort measures Outcome: Adequate for Discharge   Problem: Health Behavior/Discharge Planning: Goal: Ability to manage health-related needs will improve Outcome: Adequate for Discharge   Problem: Clinical Measurements: Goal: Ability to maintain clinical measurements within normal limits will improve Outcome: Adequate for Discharge Goal: Will remain free from infection Outcome: Adequate for Discharge Goal: Diagnostic test results will improve Outcome: Adequate for Discharge Goal: Respiratory complications will improve Outcome: Adequate for Discharge Goal: Cardiovascular complication will be avoided Outcome: Adequate for Discharge   Problem: Nutrition: Goal: Adequate nutrition will be maintained Outcome: Adequate for Discharge   Problem: Elimination: Goal: Will not experience complications related to bowel motility Outcome: Adequate for Discharge Goal: Will not experience complications related to urinary retention Outcome: Adequate for Discharge   Problem: Pain Managment: Goal: General experience of comfort will improve Outcome: Adequate for Discharge   Problem: Skin Integrity: Goal: Risk for impaired skin integrity will decrease Outcome: Adequate for Discharge   Problem: Education: Goal: Knowledge of disease and its progression will improve Outcome: Adequate for Discharge Goal: Individualized Educational Video(s) Outcome: Adequate for Discharge   Problem: Fluid Volume: Goal: Compliance with measures to maintain balanced fluid volume will improve Outcome: Adequate for Discharge   Problem: Clinical Measurements: Goal: Complications related to the disease process, condition or treatment will be avoided or minimized Outcome: Adequate for Discharge

## 2019-06-17 NOTE — Progress Notes (Addendum)
Patient admitted Obs status with nausea/vomiting, abdominal pain. 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  S/p removal of drains.   CT Abd with near complete resolution of previous air/fluid collection  Labs notable for K+ 5.3 >5.8 on CL diet. Loklema ordered.   This am she reports that nausea/vomiting improved. Denies fevers, chills, cp, sob, abd pain. She is asking if she will go home today.   Last dialyzed 4/2 in the hospital. Similar presentation and found to be hyperkalemic, had urgent dialysis and released.   Dialysis orders:  [facility] MWF 350/800 EDW 80 kg 2K/3.5Ca UFP 4 AVF Heparin 4000+2000 midrun  Mircera 100 last 3/22   Plan Lokelma ordered for ^K+. Follow trend. Will write orders for HD 4/5 if still here.  Full consult if inpatient admission.     Lynnda Child PA-C [facility] Pager [phone] 06/17/2019,12:24 PM

## 2019-06-18 ENCOUNTER — Other Ambulatory Visit: Payer: Self-pay

## 2019-06-18 ENCOUNTER — Encounter (HOSPITAL_COMMUNITY): Payer: Self-pay

## 2019-06-18 MED ORDER — SODIUM CHLORIDE 0.9% FLUSH
3.0000 mL | Freq: Once | INTRAVENOUS | Status: AC
  Administered 2019-06-19: 3 mL via INTRAVENOUS

## 2019-06-18 NOTE — ED Triage Notes (Signed)
Pt reports severe abdominal pain mostly on the right side- she states where she had "her glands removed." She is tearful and tachycardic in triage, pt is vomiting and had dialysis yesterday.

## 2019-06-19 ENCOUNTER — Inpatient Hospital Stay (HOSPITAL_COMMUNITY)
Admission: EM | Admit: 2019-06-19 | Discharge: 2019-06-22 | DRG: 073 | Disposition: A | Discharge status: Home / Self Care | Payer: Medicare Other | Attending: Internal Medicine | Admitting: Internal Medicine

## 2019-06-19 ENCOUNTER — Emergency Department (HOSPITAL_COMMUNITY): Payer: Medicare Other

## 2019-06-19 ENCOUNTER — Encounter (HOSPITAL_COMMUNITY): Payer: Self-pay

## 2019-06-19 DIAGNOSIS — K920 Hematemesis: Secondary | ICD-10-CM | POA: Diagnosis not present

## 2019-06-19 DIAGNOSIS — R1110 Vomiting, unspecified: Secondary | ICD-10-CM | POA: Diagnosis present

## 2019-06-19 DIAGNOSIS — E1022 Type 1 diabetes mellitus with diabetic chronic kidney disease: Secondary | ICD-10-CM | POA: Diagnosis not present

## 2019-06-19 DIAGNOSIS — E1065 Type 1 diabetes mellitus with hyperglycemia: Secondary | ICD-10-CM | POA: Diagnosis not present

## 2019-06-19 DIAGNOSIS — F819 Developmental disorder of scholastic skills, unspecified: Secondary | ICD-10-CM | POA: Diagnosis not present

## 2019-06-19 DIAGNOSIS — Z90710 Acquired absence of both cervix and uterus: Secondary | ICD-10-CM | POA: Diagnosis not present

## 2019-06-19 DIAGNOSIS — Q970 Karyotype 47, XXX: Secondary | ICD-10-CM | POA: Diagnosis not present

## 2019-06-19 DIAGNOSIS — N186 End stage renal disease: Secondary | ICD-10-CM | POA: Diagnosis not present

## 2019-06-19 DIAGNOSIS — K3184 Gastroparesis: Secondary | ICD-10-CM | POA: Diagnosis not present

## 2019-06-19 DIAGNOSIS — Z905 Acquired absence of kidney: Secondary | ICD-10-CM | POA: Diagnosis not present

## 2019-06-19 DIAGNOSIS — R1013 Epigastric pain: Secondary | ICD-10-CM

## 2019-06-19 DIAGNOSIS — Z940 Kidney transplant status: Secondary | ICD-10-CM | POA: Diagnosis not present

## 2019-06-19 DIAGNOSIS — Z91013 Allergy to seafood: Secondary | ICD-10-CM | POA: Diagnosis not present

## 2019-06-19 DIAGNOSIS — R000 Tachycardia, unspecified: Secondary | ICD-10-CM | POA: Diagnosis not present

## 2019-06-19 DIAGNOSIS — D631 Anemia in chronic kidney disease: Secondary | ICD-10-CM | POA: Diagnosis present

## 2019-06-19 DIAGNOSIS — Z20822 Contact with and (suspected) exposure to covid-19: Secondary | ICD-10-CM | POA: Diagnosis not present

## 2019-06-19 DIAGNOSIS — E860 Dehydration: Secondary | ICD-10-CM | POA: Diagnosis present

## 2019-06-19 DIAGNOSIS — Z992 Dependence on renal dialysis: Secondary | ICD-10-CM | POA: Diagnosis not present

## 2019-06-19 DIAGNOSIS — N189 Chronic kidney disease, unspecified: Secondary | ICD-10-CM | POA: Diagnosis present

## 2019-06-19 DIAGNOSIS — IMO0002 Reserved for concepts with insufficient information to code with codable children: Secondary | ICD-10-CM | POA: Diagnosis present

## 2019-06-19 DIAGNOSIS — I10 Essential (primary) hypertension: Secondary | ICD-10-CM | POA: Diagnosis present

## 2019-06-19 DIAGNOSIS — R109 Unspecified abdominal pain: Secondary | ICD-10-CM | POA: Diagnosis not present

## 2019-06-19 DIAGNOSIS — E1043 Type 1 diabetes mellitus with diabetic autonomic (poly)neuropathy: Secondary | ICD-10-CM | POA: Diagnosis not present

## 2019-06-19 DIAGNOSIS — E109 Type 1 diabetes mellitus without complications: Secondary | ICD-10-CM | POA: Diagnosis present

## 2019-06-19 DIAGNOSIS — J302 Other seasonal allergic rhinitis: Secondary | ICD-10-CM | POA: Diagnosis not present

## 2019-06-19 DIAGNOSIS — E1143 Type 2 diabetes mellitus with diabetic autonomic (poly)neuropathy: Secondary | ICD-10-CM | POA: Diagnosis present

## 2019-06-19 DIAGNOSIS — N2581 Secondary hyperparathyroidism of renal origin: Secondary | ICD-10-CM | POA: Diagnosis not present

## 2019-06-19 DIAGNOSIS — E785 Hyperlipidemia, unspecified: Secondary | ICD-10-CM | POA: Diagnosis not present

## 2019-06-19 DIAGNOSIS — Z794 Long term (current) use of insulin: Secondary | ICD-10-CM | POA: Diagnosis not present

## 2019-06-19 DIAGNOSIS — I120 Hypertensive chronic kidney disease with stage 5 chronic kidney disease or end stage renal disease: Secondary | ICD-10-CM | POA: Diagnosis not present

## 2019-06-19 DIAGNOSIS — K219 Gastro-esophageal reflux disease without esophagitis: Secondary | ICD-10-CM | POA: Diagnosis not present

## 2019-06-19 DIAGNOSIS — R112 Nausea with vomiting, unspecified: Secondary | ICD-10-CM

## 2019-06-19 DIAGNOSIS — E8889 Other specified metabolic disorders: Secondary | ICD-10-CM | POA: Diagnosis not present

## 2019-06-19 LAB — CBC
HCT: 38 % (ref 36.0–46.0)
HCT: 37 % (ref 36.0–46.0)
Hemoglobin: 12.7 g/dL (ref 12.0–15.0)
Hemoglobin: 12 g/dL (ref 12.0–15.0)
MCH: 32.5 pg (ref 26.0–34.0)
MCH: 32.2 pg (ref 26.0–34.0)
MCHC: 33.4 g/dL (ref 30.0–36.0)
MCHC: 32.4 g/dL (ref 30.0–36.0)
MCV: 97.2 fL (ref 80.0–100.0)
MCV: 99.2 fL (ref 80.0–100.0)
Platelets: 191 10*3/uL (ref 150–400)
Platelets: 189 10*3/uL (ref 150–400)
RBC: 3.91 MIL/uL (ref 3.87–5.11)
RBC: 3.73 MIL/uL — ABNORMAL LOW (ref 3.87–5.11)
RDW: 17.3 % — ABNORMAL HIGH (ref 11.5–15.5)
RDW: 17.8 % — ABNORMAL HIGH (ref 11.5–15.5)
WBC: 11 10*3/uL — ABNORMAL HIGH (ref 4.0–10.5)
WBC: 10.2 10*3/uL (ref 4.0–10.5)
nRBC: 0 % (ref 0.0–0.2)
nRBC: 0 % (ref 0.0–0.2)

## 2019-06-19 LAB — BLOOD GAS, ARTERIAL
Acid-Base Excess: 8.3 mmol/L — ABNORMAL HIGH (ref 0.0–2.0)
Bicarbonate: 33.5 mmol/L — ABNORMAL HIGH (ref 20.0–28.0)
FIO2: 21
O2 Saturation: 88.9 %
Patient temperature: 98.6
pCO2 arterial: 51.1 mmHg — ABNORMAL HIGH (ref 32.0–48.0)
pH, Arterial: 7.433 (ref 7.350–7.450)
pO2, Arterial: 66 mmHg — ABNORMAL LOW (ref 83.0–108.0)

## 2019-06-19 LAB — I-STAT CHEM 8, ED
BUN: 30 mg/dL — ABNORMAL HIGH (ref 6–20)
Calcium, Ion: 1.02 mmol/L — ABNORMAL LOW (ref 1.15–1.40)
Chloride: 89 mmol/L — ABNORMAL LOW (ref 98–111)
Creatinine, Ser: 6.7 mg/dL — ABNORMAL HIGH (ref 0.44–1.00)
Glucose, Bld: 300 mg/dL — ABNORMAL HIGH (ref 70–99)
HCT: 43 % (ref 36.0–46.0)
Hemoglobin: 14.6 g/dL (ref 12.0–15.0)
Potassium: 4.3 mmol/L (ref 3.5–5.1)
Sodium: 136 mmol/L (ref 135–145)
TCO2: 36 mmol/L — ABNORMAL HIGH (ref 22–32)

## 2019-06-19 LAB — COMPREHENSIVE METABOLIC PANEL
ALT: 18 U/L (ref 0–44)
AST: 23 U/L (ref 15–41)
Albumin: 4.9 g/dL (ref 3.5–5.0)
Alkaline Phosphatase: 72 U/L (ref 38–126)
Anion gap: 19 — ABNORMAL HIGH (ref 5–15)
BUN: 31 mg/dL — ABNORMAL HIGH (ref 6–20)
CO2: 32 mmol/L (ref 22–32)
Calcium: 9.6 mg/dL (ref 8.9–10.3)
Chloride: 87 mmol/L — ABNORMAL LOW (ref 98–111)
Creatinine, Ser: 6.49 mg/dL — ABNORMAL HIGH (ref 0.44–1.00)
GFR calc Af Amer: 9 mL/min — ABNORMAL LOW (ref >60)
GFR calc non Af Amer: 8 mL/min — ABNORMAL LOW (ref >60)
Glucose, Bld: 296 mg/dL — ABNORMAL HIGH (ref 70–99)
Potassium: 4.6 mmol/L (ref 3.5–5.1)
Sodium: 138 mmol/L (ref 135–145)
Total Bilirubin: 0.7 mg/dL (ref 0.3–1.2)
Total Protein: 10.1 g/dL — ABNORMAL HIGH (ref 6.5–8.1)

## 2019-06-19 LAB — PROTIME-INR
INR: 1.1 (ref 0.8–1.2)
Prothrombin Time: 13.9 seconds (ref 11.4–15.2)

## 2019-06-19 LAB — GLUCOSE, CAPILLARY: Glucose-Capillary: 253 mg/dL — ABNORMAL HIGH (ref 70–99)

## 2019-06-19 LAB — I-STAT BETA HCG BLOOD, ED (MC, WL, AP ONLY): I-stat hCG, quantitative: 5.2 m[IU]/mL — ABNORMAL HIGH (ref <5)

## 2019-06-19 LAB — CBG MONITORING, ED
Glucose-Capillary: 242 mg/dL — ABNORMAL HIGH (ref 70–99)
Glucose-Capillary: 191 mg/dL — ABNORMAL HIGH (ref 70–99)
Glucose-Capillary: 227 mg/dL — ABNORMAL HIGH (ref 70–99)

## 2019-06-19 LAB — LIPASE, BLOOD: Lipase: 37 U/L (ref 11–51)

## 2019-06-19 LAB — APTT: aPTT: 32 seconds (ref 24–36)

## 2019-06-19 LAB — SARS CORONAVIRUS 2 (TAT 6-24 HRS): SARS Coronavirus 2: NEGATIVE

## 2019-06-19 MED ORDER — FENTANYL CITRATE (PF) 100 MCG/2ML IJ SOLN
25.0000 ug | INTRAMUSCULAR | Status: DC | PRN
  Administered 2019-06-19: 50 ug via INTRAVENOUS
  Filled 2019-06-19: qty 2

## 2019-06-19 MED ORDER — ONDANSETRON HCL 4 MG/2ML IJ SOLN
4.0000 mg | Freq: Four times a day (QID) | INTRAMUSCULAR | Status: DC | PRN
  Administered 2019-06-19 – 2019-06-20 (×2): 4 mg via INTRAVENOUS
  Filled 2019-06-19 (×2): qty 2

## 2019-06-19 MED ORDER — INSULIN ASPART 100 UNIT/ML ~~LOC~~ SOLN
0.0000 [IU] | Freq: Four times a day (QID) | SUBCUTANEOUS | Status: DC
  Administered 2019-06-19: 3 [IU] via SUBCUTANEOUS
  Administered 2019-06-19: 2 [IU] via SUBCUTANEOUS
  Administered 2019-06-19 – 2019-06-20 (×2): 1 [IU] via SUBCUTANEOUS
  Filled 2019-06-19: qty 0.06

## 2019-06-19 MED ORDER — LIDOCAINE VISCOUS HCL 2 % MT SOLN
15.0000 mL | Freq: Once | OROMUCOSAL | Status: AC
  Administered 2019-06-19: 15 mL via ORAL
  Filled 2019-06-19: qty 15

## 2019-06-19 MED ORDER — METOPROLOL TARTRATE 5 MG/5ML IV SOLN
5.0000 mg | Freq: Once | INTRAVENOUS | Status: AC
  Administered 2019-06-19: 5 mg via INTRAVENOUS
  Filled 2019-06-19: qty 5

## 2019-06-19 MED ORDER — INSULIN DEGLUDEC 100 UNIT/ML ~~LOC~~ SOPN: 20.0000 [IU] | PEN_INJECTOR | Freq: Every day | SUBCUTANEOUS | Status: DC

## 2019-06-19 MED ORDER — ALUM & MAG HYDROXIDE-SIMETH 200-200-20 MG/5ML PO SUSP
30.0000 mL | Freq: Once | ORAL | Status: AC
  Administered 2019-06-19: 30 mL via ORAL
  Filled 2019-06-19: qty 30

## 2019-06-19 MED ORDER — FAMOTIDINE IN NACL 20-0.9 MG/50ML-% IV SOLN
20.0000 mg | Freq: Every day | INTRAVENOUS | Status: DC
  Administered 2019-06-19 – 2019-06-20 (×2): 20 mg via INTRAVENOUS
  Filled 2019-06-19 (×4): qty 50

## 2019-06-19 MED ORDER — INSULIN GLARGINE 100 UNIT/ML ~~LOC~~ SOLN
20.0000 [IU] | Freq: Every day | SUBCUTANEOUS | Status: DC
  Administered 2019-06-19 – 2019-06-22 (×4): 20 [IU] via SUBCUTANEOUS
  Filled 2019-06-19 (×4): qty 0.2

## 2019-06-19 MED ORDER — METOCLOPRAMIDE HCL 5 MG/ML IJ SOLN
5.0000 mg | INTRAMUSCULAR | Status: DC
  Administered 2019-06-19 – 2019-06-20 (×7): 5 mg via INTRAVENOUS
  Filled 2019-06-19 (×6): qty 2

## 2019-06-19 MED ORDER — METOCLOPRAMIDE HCL 5 MG/ML IJ SOLN
10.0000 mg | Freq: Once | INTRAMUSCULAR | Status: AC
  Administered 2019-06-19: 10 mg via INTRAVENOUS
  Filled 2019-06-19: qty 2

## 2019-06-19 MED ORDER — FENTANYL CITRATE (PF) 100 MCG/2ML IJ SOLN
50.0000 ug | INTRAMUSCULAR | Status: AC | PRN
  Administered 2019-06-19 – 2019-06-20 (×3): 50 ug via INTRAVENOUS
  Filled 2019-06-19 (×3): qty 2

## 2019-06-19 MED ORDER — METOPROLOL TARTRATE 5 MG/5ML IV SOLN
5.0000 mg | Freq: Three times a day (TID) | INTRAVENOUS | Status: DC
  Administered 2019-06-19 – 2019-06-21 (×4): 5 mg via INTRAVENOUS
  Filled 2019-06-19 (×4): qty 5

## 2019-06-19 MED ORDER — METOCLOPRAMIDE HCL 5 MG/ML IJ SOLN
5.0000 mg | Freq: Once | INTRAMUSCULAR | Status: AC
  Administered 2019-06-19: 5 mg via INTRAVENOUS
  Filled 2019-06-19: qty 2

## 2019-06-19 MED ORDER — SODIUM CHLORIDE 0.9 % IV BOLUS
500.0000 mL | Freq: Once | INTRAVENOUS | Status: AC
  Administered 2019-06-19: 500 mL via INTRAVENOUS

## 2019-06-19 MED ORDER — HYOSCYAMINE SULFATE 0.125 MG SL SUBL
0.2500 mg | SUBLINGUAL_TABLET | Freq: Once | SUBLINGUAL | Status: AC
  Administered 2019-06-19: 0.25 mg via SUBLINGUAL
  Filled 2019-06-19: qty 2

## 2019-06-19 MED ORDER — PANTOPRAZOLE SODIUM 40 MG IV SOLR
40.0000 mg | INTRAVENOUS | Status: DC
  Administered 2019-06-19 – 2019-06-21 (×3): 40 mg via INTRAVENOUS
  Filled 2019-06-19 (×3): qty 40

## 2019-06-19 MED ORDER — FENTANYL CITRATE (PF) 100 MCG/2ML IJ SOLN
50.0000 ug | Freq: Once | INTRAMUSCULAR | Status: AC
  Administered 2019-06-19: 50 ug via INTRAVENOUS
  Filled 2019-06-19: qty 2

## 2019-06-19 MED ORDER — IOHEXOL 300 MG/ML  SOLN
100.0000 mL | Freq: Once | INTRAMUSCULAR | Status: AC | PRN
  Administered 2019-06-19: 100 mL via INTRAVENOUS

## 2019-06-19 NOTE — H&P (Signed)
History and Physical    [name] OZD:[id number] DOB: 1985/07/16 DOA: 06/19/2019  PCP: [name], MD   Patient coming from: Home.  I have personally briefly reviewed patient's old medical records in [place]  Chief Complaint: Abdominal pain and vomiting.  HPI: [name] is a 34 y.o. female with medical history significant of ESRD on HD, history of kidney transplant recipient, pyelonephritis, anemia of ESRD, type 1 diabetes, GERD, gastroparesis, hyperlipidemia, hypertension, pseudoseizures, seasonal allergies, history of UTI, triple X syndrome who was recently admitted from 06/16/2019 until 06/17/2019 due to exacerbation of gastroparesis now returning to the ED after having reassess her ovation gastroparesis following hemodialysis yesterday.  She mentions that shortly after finishing her HD she became nauseous and developed sharp abdominal pain.  She vomited at least 15 times yesterday evening with some bloody streaks.  She denies diarrhea or constipation (last BM was on Sunday).  She denies melena or hematochezia.  She does not regularly urinate.  She denies fever, chills, sore throat, rhinorrhea, dyspnea, wheezing or hemoptysis.  No chest pain, PND, orthopnea or lower extremity edema.  She has fell some lightheadedness.  She denies blurred vision.  She was not able to keep down her medications or retake them later.  ED Course: Initial vital signs were temperature 98.4 F, pulse 144, respirations 24, blood pressure 162/101 mmHg O2 sat 100% on room air.  The patient received a 500 mL NS bolus, metoclopramide 10 mg IVP x1, metoclopramide 5 mg IVP x1, famotidine 20 mg IVPB x1, Zofran 4 mg IVP, Maalox/Mylanta/Viscous Xylocaine x1, hyoscyamine 0.25 mg sublingually x1 and fentanyl 50 mcg x 1.  I added metoprolol 5 mg IVP and another 500 mL bolus.  CBC shows a white count of 11.0, hemoglobin 12.7 g/dL and platelets 191.  CMP shows a sodium of 138, potassium 4.7, chloride 87 and CO2 32  mmol/L.  Glucose 296, BUN 31, creatinine 6.49 mg/dL.  LFTs were normal, except for a total protein of 10.1 g/dL.  2 days ago total protein was 8.1 g/dL.  Lipase was 37 units/L.  ABG showed an normal pH, PCO2 of 51.1, PO2 of 66.0 mmHg.  Bicarbonate was 33.5 and acid base excess was 8.3 mmol/L.  Imaging: A 1 view chest radiograph shows low lung volumes with vascular crowding and streaky bibasilar atelectasis.  CT abdomen/pelvis showed a lower esophageal thickening, likely esophagitis due to history of vomiting.  There was stable intra-abdominal imaging when compared to the 3 days ago.  There was no bowel obstruction stable postsurgical changes with tiny gas bubbles in the right lower quadrant side of prior renal transplant.  Please see images and full radiology report for further detail.  Review of Systems: As per HPI otherwise 10 point review of systems negative.   Past Medical History:  Diagnosis Date  . Anemia   . Chronic kidney disease    kidney transplant 07  . Diabetes mellitus    Pt reports diagnosis in June 2011, Type 2  . GERD (gastroesophageal reflux disease)   . Hyperlipidemia   . Hypertension   . 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 Pyelonephritis 06/23/2014  . Seasonal allergies   . UTI (urinary tract infection) 01/09/2015  . 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 ARTERIOVENOUS GRAFT PLACEMENT Bilateral    "neither work" (10/24/2017)  . AV FISTULA PLACEMENT Left 10/26/2018   Procedure: CREATION OF ARTERIOVENOUS FISTULA  LEFT ARM;  Surgeon: [name], MD;  Location: [place];  Service: Vascular;  Laterality: Left;  . BASCILIC VEIN TRANSPOSITION Left 12/21/2018   Procedure: Left arm BASILIC VEIN TRANSPOSITION SECOND STAGE;  Surgeon: [name], MD;  Location: [place];  Service: Vascular;  Laterality: Left;  . CHOLECYSTECTOMY N/A 06/30/2017   Procedure: LAPAROSCOPIC CHOLECYSTECTOMY WITH INTRAOPERATIVE CHOLANGIOGRAM;  Surgeon: Excell Seltzer, MD;  Location[facility];  Service: General;  Laterality: N/A;  . ESOPHAGOGASTRODUODENOSCOPY (EGD) WITH PROPOFOL N/A 07/04/2017   Procedure: ESOPHAGOGASTRODUODENOSCOPY (EGD) WITH PROPOFOL;  Surgeon: Clarene Essex, MD;  Location: [facility];  Service: Endoscopy;  Laterality: N/A;  . INSERTION OF DIALYSIS CATHETER N/A 03/20/2018   Procedure: INSERTION OF TUNNELED DIALYSIS CATHETER - RIGHT INTERANL JUGULAR PLACEMENT;  Surgeon: Angelia Mould, MD;  Location: [place];  Service: Vascular;  Laterality: N/A;  . IR GUIDED [place]  10/28/2018  . KIDNEY TRANSPLANT  2007  . PARATHYROIDECTOMY  ?2012   "3/4 removed" (10/24/2017)  . RENAL BIOPSY Bilateral 2003  . UPPER EXTREMITY VENOGRAPHY Bilateral 10/19/2018   Procedure: UPPER EXTREMITY VENOGRAPHY;  Surgeon: [name], MD;  Location: [place] [facility];  Service: Cardiovascular;  Laterality: Bilateral;  Bilateral      reports that she has never smoked. She has never used smokeless tobacco. She reports that she does not drink alcohol or use drugs.  Allergies  Allergen Reactions  . Benadryl [Diphenhydramine-Zinc Acetate] Shortness Of Breath  . Diphenhydramine Hcl Anaphylaxis    REACTION: Stopped breathing in ICU  . Doxycycline Shortness Of Breath  . Motrin [Ibuprofen] Shortness Of Breath and Itching    Per pt  . Banana Other (See Comments)    Sick on the stomach  . Iron Dextran Other (See Comments)    REACTION: vein irritation  . Shellfish Allergy Hives  . Chlorhexidine Itching    Family History  Problem Relation Age of Onset  . Arthritis Mother   . Hypertension Mother   . Aneurysm Mother        died of brain aneurysm  . CAD Father        Has 3 stents  . Diabetes Father        borderline  . Other Brother         Died in war   Prior to Admission medications   Medication Sig Start Date End Date Taking? Authorizing Provider  acetaminophen (TYLENOL) 500 MG tablet Take 1,000 mg by mouth every 6 (six) hours as needed for moderate pain (pain).    Yes [provider]  b complex-vitamin c-folic acid (NEPHRO-VITE) 0.8 MG TABS tablet Take 1 tablet by mouth every Monday, Wednesday, and Friday. After dialysis on dialysis days 07/19/18  Yes [provider]  calcium acetate (PHOSLO) 667 MG capsule Take 1,334 mg by mouth 2 (two) times daily. Taking 1 capsule with a snack 11/24/18  Yes [provider]  Calcium Carbonate Antacid (CALCIUM CARBONATE, DOSED IN MG ELEMENTAL CALCIUM,) 1250 MG/5ML SUSP Take 5 mLs by mouth 3 (three) times daily.  11/17/18  Yes [provider]  cetirizine (ZYRTEC) 10 MG tablet Take 10 mg by mouth daily as needed for allergies.   Yes [provider]  D 1000 25 MCG (1000 UT) capsule Take 1,000 Units by mouth every other day.  04/27/19  Yes [provider]  diclofenac Sodium (VOLTAREN) 1 % GEL Apply 2 g topically 3 (three) times daily as needed (pain).  04/19/19  Yes [provider]  dicyclomine (BENTYL) 20 MG tablet Take 1 tablet (20 mg total) by mouth 2 (two) times daily. Patient taking differently: Take 20 mg by mouth 2 (two) times daily as needed (stomach issues.).  02/06/18  Yes [name], Ripudeep K, MD  fluticasone (FLONASE) 50 MCG/ACT nasal spray Place 2 sprays into both nostrils daily as needed for allergies. 09/03/18  Yes [name], MD  gabapentin (NEURONTIN) 400 MG capsule Take 400 mg by mouth 3 (three) times daily. 04/29/19  Yes [provider]  hydrOXYzine (ATARAX/VISTARIL) 50 MG tablet Take 1 tablet (50 mg total) by mouth 2 (two) times daily as needed for itching. 02/06/18  Yes [name], Ripudeep K, MD  insulin aspart (NOVOLOG FLEXPEN) 100 UNIT/ML FlexPen Inject 2-10 Units into the skin 3 (three) times daily with meals. as needed for  blood sugar management (sliding scale)   Yes [provider]  insulin degludec (TRESIBA) 100 UNIT/ML SOPN FlexTouch Pen Inject 0.6 mLs (60 Units total) into the skin 2 (two) times daily. Reports taking 24 units QAM Patient taking differently: Inject 20 Units into the skin daily.  03/06/18  Yes Charlynne Cousins, MD  loperamide (IMODIUM) 2 MG capsule Take 2 mg by mouth 4 (four) times daily as needed for diarrhea or loose stools.    Yes [provider]  metoCLOPramide (REGLAN) 10 MG tablet Take 1 tablet (10 mg total) by mouth 3 (three) times daily before meals. Patient taking differently: Take 10 mg by mouth 3 (three) times daily as needed (stomach issues.).  02/06/18  Yes [name], Ripudeep K, MD  metoprolol tartrate (LOPRESSOR) 25 MG tablet Take 1 tablet (25 mg total) by mouth 2 (two) times daily. Patient taking differently: Take 25 mg by mouth daily.  02/07/19 06/19/19 Yes Trifan, Carola Rhine, MD  pantoprazole (PROTONIX) 40 MG tablet Take 40 mg by mouth See admin instructions. Once to twice daily   Yes [provider]  SANTYL ointment Apply 1 application topically daily. 11/18/18  Yes [provider]  simvastatin (ZOCOR) 20 MG tablet Take 20 mg by mouth at bedtime.    Yes [provider]  ACCU-CHEK SOFTCLIX LANCETS lancets Use to check blood sugar 4 times per day. 12/29/15   Renato Shin, MD  amLODipine (NORVASC) 10 MG tablet Take 1 tablet (10 mg total) by mouth daily. 03/25/18 02/07/19  Thurnell Lose, MD    Physical Exam: Vitals:   06/19/19 0400 06/19/19 0515 06/19/19 0545 06/19/19 0730  BP: (!) 178/115 (!) 166/98 (!) 149/99 (!) 157/111  Pulse: (!) 129 (!) 124 (!) 126 (!) 122  Resp: 14 13 16 14   Temp:      TempSrc:      SpO2: 93% 94% 94% 100%  Weight:      Height:        Constitutional: NAD, calm, comfortable Eyes: PERRL, lids and conjunctivae normal ENMT: Mucous membranes are dry. Posterior pharynx clear of any exudate or lesions. Neck:  normal, supple, no masses, no thyromegaly Respiratory: Decreased breath sounds in bases, otherwise clear to auscultation bilaterally, no wheezing, no crackles. Normal respiratory effort. No accessory muscle use.  Cardiovascular: Tachycardic at 122 bpm, no murmurs / rubs / gallops. No extremity edema. 2+ pedal pulses. No carotid bruits.  Abdomen: Nondistended.  BS positive, soft, positive for gastric tenderness, no guarding or rebound,  no masses palpated. No hepatosplenomegaly. Musculoskeletal: no clubbing / cyanosis.  Good ROM, no contractures. Normal muscle tone.  Skin: no rashes, lesions, ulcers on limited dermatological examination. Neurologic: CN 2-12 grossly intact. Sensation intact, DTR normal. Strength 5/5 in all 4.  Psychiatric: Somnolent, but wakes up and answer questions. Alert and oriented x 3. Normal mood.   Labs on Admission: I have personally reviewed following labs and imaging studies  CBC: Recent Labs  Lab 06/15/19 0735 06/15/19 0735 06/15/19 1427 06/15/19 1427 06/16/19 1439 06/16/19 1647 06/17/19 0234 06/18/19 2325 06/19/19 0142  WBC 6.9  --  6.1  --  9.2  --  9.9 11.0*  --   HGB 10.0*   < > 9.7*   < > 13.2 11.6* 10.8* 12.7 14.6  HCT 31.9*   < > 30.5*   < > 40.6 34.0* 34.2* 38.0 43.0  MCV 100.6*  --  100.7*  --  99.0  --  101.2* 97.2  --   PLT 185  --  159  --  180  --  181 191  --    < > = values in this interval not displayed.   Basic Metabolic Panel: Recent Labs  Lab 06/15/19 0735 06/15/19 0956 06/15/19 1427 06/15/19 1501 06/16/19 1439 06/16/19 1647 06/17/19 0234 06/18/19 2325 06/19/19 0142  NA 135   < > 140   < > 137 138 137 138 136  K 6.7*   < > 4.8   < > 5.3* 5.2* 5.8* 4.6 4.3  CL 93*   < > 98  --  93*  --  95* 87* 89*  CO2 24  --  24  --  26  --  25 32  --   GLUCOSE 236*   < > 281*  --  387*  --  179* 296* 300*  BUN 53*   < > 59*  --  37*  --  52* 31* 30*  CREATININE 9.39*   < > 10.34*  --  7.42*  --  8.26* 6.49* 6.70*  CALCIUM 6.5*  --  6.8*   --  9.5  --  8.0* 9.6  --   MG  --   --   --   --   --   --  2.2  --   --   PHOS  --   --  7.0*  --   --   --  6.5*  --   --    < > = values in this interval not displayed.   GFR: Estimated Creatinine Clearance: 12.6 mL/min (A) (by C-G formula based on SCr of 6.7 mg/dL (H)). Liver Function Tests: Recent Labs  Lab 06/15/19 0735 06/15/19 1427 06/16/19 1439 06/17/19 0234 06/18/19 2325  AST 55*  --  27 19 23   ALT 17  --  20 14 18   ALKPHOS 53  --  75 58 72  BILITOT 0.4  --  0.8 0.8 0.7  PROT 7.1  --  9.9* 8.1 10.1*  ALBUMIN 3.5 3.2* 4.5 3.8 4.9   Recent Labs  Lab 06/15/19 0735 06/18/19 2325  LIPASE 36 37   No results for input(s): AMMONIA in the last 168 hours. Coagulation Profile: No results for input(s): INR, PROTIME in the last 168 hours. Cardiac Enzymes: Recent Labs  Lab 06/16/19 2121  CKTOTAL 61   BNP (last 3 results) No results for input(s): PROBNP in the last 8760 hours. HbA1C: Recent Labs    06/17/19 0234  HGBA1C 9.2*  CBG: Recent Labs  Lab 06/15/19 0737 06/16/19 2231 06/17/19 0414 06/17/19 0713 06/17/19 1111  GLUCAP 235* 335* 108* 112* 208*   Lipid Profile: No results for input(s): CHOL, HDL, LDLCALC, TRIG, CHOLHDL, LDLDIRECT in the last 72 hours. Thyroid Function Tests: No results for input(s): TSH, T4TOTAL, FREET4, T3FREE, THYROIDAB in the last 72 hours. Anemia Panel: No results for input(s): VITAMINB12, FOLATE, FERRITIN, TIBC, IRON, RETICCTPCT in the last 72 hours.  Radiological Exams on Admission: CT ABDOMEN PELVIS W CONTRAST  Result Date: 06/19/2019 CLINICAL DATA:  Nausea and vomiting with abdominal pain EXAM: CT ABDOMEN AND PELVIS WITH CONTRAST TECHNIQUE: Multidetector CT imaging of the abdomen and pelvis was performed using the standard protocol following bolus administration of intravenous contrast. CONTRAST:  194mL OMNIPAQUE IOHEXOL 300 MG/ML  SOLN COMPARISON:  CT from 3 days ago FINDINGS: Lower chest: Lower esophageal low-density wall  thickening which may be related to the history of vomiting. Hepatobiliary: No focal liver abnormality.Cholecystectomy. No bile duct dilatation. Pancreas: Unremarkable. Spleen: Unremarkable. Adrenals/Urinary Tract: Negative adrenals. 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 There is history of renal transplant to the right lower quadrant with architectural distortion and sheet like scarring in this area with tiny gas bubbles that are unchanged. No fluid collection or regional inflammatory changes. Negative collapsed bladder. Stomach/Bowel:  No obstruction. No appendicitis. Vascular/Lymphatic: No acute vascular abnormality. No mass or adenopathy. Reproductive:Hysterectomy. Other: No ascites or pneumoperitoneum. Musculoskeletal: No acute abnormalities. IMPRESSION: 1. Lower esophageal thickening, likely esophagitis in this patient with history of vomiting. 2. Stable intra-abdominal imaging compared to 3 days ago. No underlying bowel obstruction. 3. Stable postsurgical changes with tiny gas bubbles in the right lower quadrant, site of prior renal transplant. Electronically Signed   By: Monte Fantasia M.D.   On: 06/19/2019 05:09   DG Chest Port 1 View  Result Date: 06/19/2019 CLINICAL DATA:  Vomiting for 3 days. EXAM: PORTABLE CHEST 1 VIEW COMPARISON:  Chest x-ray 06/15/2019 FINDINGS: The cardiac silhouette, mediastinal and hilar contours are within normal limits given the AP projection, portable technique and low lung volumes. Low lung volumes with vascular crowding and streaky bibasilar atelectasis. No infiltrates, edema or effusions. The bony thorax is intact. IMPRESSION: Low lung volumes with vascular crowding and streaky bibasilar atelectasis. Electronically Signed   By: Marijo Sanes M.D.   On: 06/19/2019 07:02    EKG: Independently reviewed. Vent. rate 136 BPM PR interval * ms QRS duration 80 ms QT/QTc 309/465 ms P-R-T axes 65 88 70 Sinus tachycardia  Assessment/Plan Principal Problem:    Intractable abdominal pain   Hematemesis Observation/progressive unit. Keep n.p.o. Gentle/time-limited IV hydration. Continue IV famotidine. Add IV Protonix 40 mg IVP every 24 hours Antiemetics as needed. Analgesics as needed. Monitor H&H. Consult GI to [facility].  Active Problems:   Gastroparesis Continue Reglan 5 mg IVP every 4 hours. Limit analgesics use.    Dehydration Over 2 L output during HD. Has vomited at least 15 times or more. She looks clinically dehydrated. Labs show hemoconcentration. Has received two 500 mL NS boluses. Gentle and time-limited IV hydration. Will need nephrology consult.    ESRD on hemodialysis [facility]) Will consult nephrology at [facility].    Anemia due to chronic kidney disease  Monitor H&H. Erythropoietin per nephrology.    Essential hypertension, benign Continue metoprolol and IV form.    Sinus tachycardia Volume depletion. Beta-blocker withdrawal. Continue metoprolol and cardiac monitoring.    Type 1 diabetes mellitus with complication, uncontrolled (HCC) Currently NPO. Continue basal Tyler Aas  20 units SQ daily. CBG monitoring with RI SS.      DVT prophylaxis: SCDs. Code Status: Full code. Family Communication: Disposition Plan: Observation for IV hydration, symptoms treatment, GI and nephrology. Consults called: Will need GI and nephrology at [facility]. Admission status: Observation/progressive unit.   Reubin Milan MD [facility]  If 7PM-7AM, please contact night-coverage [email or web address]  06/19/2019, 8:22 AM   This document was prepared using Dragon voice recognition software and may contain some unintended transcription errors.

## 2019-06-19 NOTE — ED Notes (Signed)
Attempted report. 4E at [facility] stated they were in report and would call back. Made aware [facility] called.

## 2019-06-19 NOTE — ED Notes (Signed)
Phlebotomy called to recollect labs

## 2019-06-19 NOTE — ED Notes (Signed)
Spoke with Dr Olevia Bowens and Dawn at bed placement re: [facility] vs. [facility] placement. Pt is likely to get a PCU bed at [facility] before a SDU bed at [facility], so the pt will remain slated to go to [facility] for bed assignment. Pt is a dialysis pt.  Information passed to CN, Stacy.

## 2019-06-19 NOTE — ED Notes (Signed)
Attempted to call report again to 4E, this RN put on hold. [facility] bedside.

## 2019-06-19 NOTE — ED Provider Notes (Signed)
Mr. [name] [facility]UN[facility] DEPT Provider Note  CSN: [id number] Arrival date & time: 06/18/19 2301  Chief Complaint(s) Abdominal Pain  HPI [name] is a 34 y.o. female with a past medical history listed below including diabetes, ESRD on dialysis who presents to the emergency department with epigastric abdominal pain consistent with recent gastroparesis flare.  Patient reports that she was recently admitted over the weekend and discharged on Sunday.  After discharge patient reports having dialysis.  Her epigastric abdominal pain with nausea and and streaky bloody emesis return following dialysis.  She has been unable to tolerate oral intake.  No chest pain or shortness of breath.  Pain is worse with palpation and emesis.  No alleviating factors.  Has been unable to keep her medication down.  Patient does not make urine.  No other physical complaints.  HPI  Past Medical History Past Medical History:  Diagnosis Date  . Anemia   . Chronic kidney disease    kidney transplant 07  . Diabetes mellitus    Pt reports diagnosis in June 2011, Type 2  . GERD (gastroesophageal reflux disease)   . Hyperlipidemia   . Hypertension   . 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 Pyelonephritis 06/23/2014  . Seasonal allergies   . UTI (urinary tract infection) 01/09/2015  . 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   Qualifier: Diagnosis of  By: Carlena Sax  MD, Colletta [place]     Patient Active Problem List   Diagnosis Date Noted  . Intra-abdominal abscess ([place]) 10/28/2018  . Hypertensive emergency 09/01/2018  . DKA (diabetic ketoacidosis) ([place]) 05/01/2018  . ESRD on hemodialysis ([place]) 05/01/2018  . Renal failure 03/18/2018  . Cellulitis of left leg 03/01/2018  . Hyperlipidemia 03/01/2018  . Hypocalcemia 03/01/2018  . Metabolic acidosis 37/90/2409  . Hypokalemia 02/03/2018  .  Incontinence of bowel 02/03/2018  . Chronic pain 11/11/2017  . Gastroparesis   . Constipation 07/13/2017  . Hematemesis 07/02/2017  . Chronic cholecystitis 06/29/2017  . Type 1 diabetes mellitus with complication, uncontrolled ([place]) 05/25/2015  . Nausea & vomiting 01/09/2015  . Renal transplant recipient   . Immunosuppressed status ([place])   . 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 Chronic kidney disease 01/04/2010  . Chronic kidney disease, stage II (mild) 01/04/2010  . OVARIAN FAILURE, PREMATURE 03/11/2009  . 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 OBESITY 09/25/2007  . Anemia due to chronic kidney disease 09/25/2007  . LEARNING DISABILITY 09/25/2007  . Essential hypertension, benign 09/25/2007   Home Medication(s) Prior to Admission medications   Medication Sig Start Date End Date Taking? Authorizing Provider  acetaminophen (TYLENOL) 500 MG tablet Take 1,000 mg by mouth every 6 (six) hours as needed for moderate pain (pain).    Yes [provider]  b complex-vitamin c-folic acid (NEPHRO-VITE) 0.8 MG TABS tablet Take 1 tablet by mouth every Monday, Wednesday, and Friday. After dialysis on dialysis days 07/19/18  Yes [provider]  calcium acetate (PHOSLO) 667 MG capsule Take 1,334 mg by mouth 2 (two) times daily. Taking 1 capsule with a snack 11/24/18  Yes [provider]  Calcium Carbonate Antacid (CALCIUM CARBONATE, DOSED IN MG ELEMENTAL CALCIUM,) 1250 MG/5ML SUSP Take 5 mLs by mouth 3 (three) times daily.  11/17/18  Yes [provider]  cetirizine (ZYRTEC) 10  MG tablet Take 10 mg by mouth daily as needed for allergies.   Yes [provider]  D 1000 25 MCG (1000 UT) capsule Take 1,000 Units by mouth every other day.  04/27/19  Yes [provider]  diclofenac Sodium (VOLTAREN) 1 % GEL Apply 2 g topically 3 (three) times daily as  needed (pain).  04/19/19  Yes [provider]  dicyclomine (BENTYL) 20 MG tablet Take 1 tablet (20 mg total) by mouth 2 (two) times daily. Patient taking differently: Take 20 mg by mouth 2 (two) times daily as needed (stomach issues.).  02/06/18  Yes [name], Ripudeep K, MD  fluticasone (FLONASE) 50 MCG/ACT nasal spray Place 2 sprays into both nostrils daily as needed for allergies. 09/03/18  Yes [name], MD  gabapentin (NEURONTIN) 400 MG capsule Take 400 mg by mouth 3 (three) times daily. 04/29/19  Yes [provider]  hydrOXYzine (ATARAX/VISTARIL) 50 MG tablet Take 1 tablet (50 mg total) by mouth 2 (two) times daily as needed for itching. 02/06/18  Yes [name], Ripudeep K, MD  insulin aspart (NOVOLOG FLEXPEN) 100 UNIT/ML FlexPen Inject 2-10 Units into the skin 3 (three) times daily with meals. as needed for blood sugar management (sliding scale)   Yes [provider]  insulin degludec (TRESIBA) 100 UNIT/ML SOPN FlexTouch Pen Inject 0.6 mLs (60 Units total) into the skin 2 (two) times daily. Reports taking 24 units QAM Patient taking differently: Inject 20 Units into the skin daily.  03/06/18  Yes Charlynne Cousins, MD  loperamide (IMODIUM) 2 MG capsule Take 2 mg by mouth 4 (four) times daily as needed for diarrhea or loose stools.    Yes [provider]  metoCLOPramide (REGLAN) 10 MG tablet Take 1 tablet (10 mg total) by mouth 3 (three) times daily before meals. Patient taking differently: Take 10 mg by mouth 3 (three) times daily as needed (stomach issues.).  02/06/18  Yes [name], Ripudeep K, MD  metoprolol tartrate (LOPRESSOR) 25 MG tablet Take 1 tablet (25 mg total) by mouth 2 (two) times daily. Patient taking differently: Take 25 mg by mouth daily.  02/07/19 06/19/19 Yes Trifan, Carola Rhine, MD  pantoprazole (PROTONIX) 40 MG tablet Take 40 mg by mouth See admin instructions. Once to twice daily   Yes [provider]  SANTYL ointment Apply 1 application  topically daily. 11/18/18  Yes [provider]  simvastatin (ZOCOR) 20 MG tablet Take 20 mg by mouth at bedtime.    Yes [provider]  ACCU-CHEK SOFTCLIX LANCETS lancets Use to check blood sugar 4 times per day. 12/29/15   Renato Shin, MD  amLODipine (NORVASC) 10 MG tablet Take 1 tablet (10 mg total) by mouth daily. 03/25/18 02/07/19  Thurnell Lose, MD  Past Surgical History Past Surgical History:  Procedure Laterality Date  . ARTERIOVENOUS GRAFT PLACEMENT Bilateral    "neither work" (10/24/2017)  . AV FISTULA PLACEMENT Left 10/26/2018   Procedure: CREATION OF ARTERIOVENOUS FISTULA  LEFT ARM;  Surgeon: [name], MD;  Location: [place];  Service: Vascular;  Laterality: Left;  . BASCILIC VEIN TRANSPOSITION Left 12/21/2018   Procedure: Left arm BASILIC VEIN TRANSPOSITION SECOND STAGE;  Surgeon: [name], MD;  Location: [place];  Service: Vascular;  Laterality: Left;  . CHOLECYSTECTOMY N/A 06/30/2017   Procedure: LAPAROSCOPIC CHOLECYSTECTOMY WITH INTRAOPERATIVE CHOLANGIOGRAM;  Surgeon: Excell Seltzer, MD;  Location: [facility];  Service: General;  Laterality: N/A;  . ESOPHAGOGASTRODUODENOSCOPY (EGD) WITH PROPOFOL N/A 07/04/2017   Procedure: ESOPHAGOGASTRODUODENOSCOPY (EGD) WITH PROPOFOL;  Surgeon: Clarene Essex, MD;  Location: [facility];  Service: Endoscopy;  Laterality: N/A;  . INSERTION OF DIALYSIS CATHETER N/A 03/20/2018   Procedure: INSERTION OF TUNNELED DIALYSIS CATHETER - RIGHT INTERANL JUGULAR PLACEMENT;  Surgeon: Angelia Mould, MD;  Location: [place];  Service: Vascular;  Laterality: N/A;  . IR GUIDED [place]  10/28/2018  . KIDNEY TRANSPLANT  2007  . PARATHYROIDECTOMY  ?2012   "3/4 removed" (10/24/2017)  . RENAL BIOPSY Bilateral 2003  . UPPER EXTREMITY VENOGRAPHY Bilateral 10/19/2018   Procedure:  UPPER EXTREMITY VENOGRAPHY;  Surgeon: [name], MD;  Location: [place] [facility];  Service: Cardiovascular;  Laterality: Bilateral;  Bilateral    Family History Family History  Problem Relation Age of Onset  . Arthritis Mother   . Hypertension Mother   . Aneurysm Mother        died of brain aneurysm  . CAD Father        Has 3 stents  . Diabetes Father        borderline  . Other Brother        Died in war    Social History Social History   Tobacco Use  . Smoking status: Never Smoker  . Smokeless tobacco: Never Used  Substance Use Topics  . Alcohol use: No  . Drug use: No   Allergies Benadryl [diphenhydramine-zinc acetate], Diphenhydramine hcl, Doxycycline, Motrin [ibuprofen], Banana, Iron dextran, Shellfish allergy, and Chlorhexidine  Review of Systems Review of Systems All other systems are reviewed and are negative for acute change except as noted in the HPI  Physical Exam Vital Signs  I have reviewed the triage vital signs BP (!) 178/115   Pulse (!) 129   Temp 98.4 F (36.9 C) (Oral)   Resp 14   Ht 5\' 6"  (1.676 m)   Wt 81.6 kg   SpO2 93%   BMI 29.05 kg/m   Physical Exam Vitals reviewed.  Constitutional:      General: She is not in acute distress.    Appearance: She is well-developed. She is not diaphoretic.  HENT:     Head: Normocephalic and atraumatic.     Right Ear: External ear normal.     Left Ear: External ear normal.     Nose: Nose normal.  Eyes:     General: No scleral icterus.    Conjunctiva/sclera: Conjunctivae normal.  Neck:     Trachea: Phonation normal.  Cardiovascular:     Rate and Rhythm: Regular rhythm. Tachycardia present.  Pulmonary:     Effort: Pulmonary effort is normal. No respiratory distress.     Breath sounds: No stridor.  Abdominal:     General: There is no distension.  Tenderness: There is abdominal tenderness in the epigastric area and left upper quadrant. There is guarding. There is no rebound.  Negative signs include Murphy's sign.  Musculoskeletal:        General: Normal range of motion.     Cervical back: Normal range of motion.  Neurological:     Mental Status: She is alert and oriented to person, place, and time.  Psychiatric:        Behavior: Behavior normal.     ED Results and Treatments Labs (all labs ordered are listed, but only abnormal results are displayed) Labs Reviewed  COMPREHENSIVE METABOLIC PANEL - Abnormal; Notable for the following components:      Result Value   Chloride 87 (*)    Glucose, Bld 296 (*)    BUN 31 (*)    Creatinine, Ser 6.49 (*)    Total Protein 10.1 (*)    GFR calc non Af Amer 8 (*)    GFR calc Af Amer 9 (*)    Anion gap 19 (*)    All other components within normal limits  CBC - Abnormal; Notable for the following components:   WBC 11.0 (*)    RDW 17.3 (*)    All other components within normal limits  BLOOD GAS, ARTERIAL - Abnormal; Notable for the following components:   pCO2 arterial 51.1 (*)    pO2, Arterial 66.0 (*)    Bicarbonate 33.5 (*)    Acid-Base Excess 8.3 (*)    All other components within normal limits  I-STAT BETA HCG BLOOD, ED ([facility], [facility], [facility] ONLY) - Abnormal; Notable for the following components:   I-stat hCG, quantitative 5.2 (*)    All other components within normal limits  I-STAT CHEM 8, ED - Abnormal; Notable for the following components:   Chloride 89 (*)    BUN 30 (*)    Creatinine, Ser 6.70 (*)    Glucose, Bld 300 (*)    Calcium, Ion 1.02 (*)    TCO2 36 (*)    All other components within normal limits  SARS CORONAVIRUS 2 (TAT 6-24 HRS)  LIPASE, BLOOD                                                                                                                         EKG  EKG Interpretation  Date/Time:  Monday June 18 2019 23:21:48 EDT Ventricular Rate:  136 PR Interval:    QRS Duration: 80 QT Interval:  309 QTC Calculation: 465 R Axis:   88 Text Interpretation: Sinus tachycardia 12 Lead;  [place] Otherwise no significant change Confirmed by Addison Lank [phone]) on 06/19/2019 12:44:53 AM      Radiology CT ABDOMEN PELVIS W CONTRAST  Result Date: 06/19/2019 CLINICAL DATA:  Nausea and vomiting with abdominal pain EXAM: CT ABDOMEN AND PELVIS WITH CONTRAST TECHNIQUE: Multidetector CT imaging of the abdomen and pelvis was performed using the standard protocol following bolus administration of intravenous contrast. CONTRAST:  163mL OMNIPAQUE IOHEXOL 300  MG/ML  SOLN COMPARISON:  CT from 3 days ago FINDINGS: Lower chest: Lower esophageal low-density wall thickening which may be related to the history of vomiting. Hepatobiliary: No focal liver abnormality.Cholecystectomy. No bile duct dilatation. Pancreas: Unremarkable. Spleen: Unremarkable. Adrenals/Urinary Tract: Negative adrenals. 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 There is history of renal transplant to the right lower quadrant with architectural distortion and sheet like scarring in this area with tiny gas bubbles that are unchanged. No fluid collection or regional inflammatory changes. Negative collapsed bladder. Stomach/Bowel:  No obstruction. No appendicitis. Vascular/Lymphatic: No acute vascular abnormality. No mass or adenopathy. Reproductive:Hysterectomy. Other: No ascites or pneumoperitoneum. Musculoskeletal: No acute abnormalities. IMPRESSION: 1. Lower esophageal thickening, likely esophagitis in this patient with history of vomiting. 2. Stable intra-abdominal imaging compared to 3 days ago. No underlying bowel obstruction. 3. Stable postsurgical changes with tiny gas bubbles in the right lower quadrant, site of prior renal transplant. Electronically Signed   By: Monte Fantasia M.D.   On: 06/19/2019 05:09   DG Chest Port 1 View  Result Date: 06/19/2019 CLINICAL DATA:  Vomiting for 3 days. EXAM: PORTABLE CHEST 1 VIEW COMPARISON:  Chest x-ray 06/15/2019 FINDINGS: The cardiac silhouette, mediastinal and hilar contours are  within normal limits given the AP projection, portable technique and low lung volumes. Low lung volumes with vascular crowding and streaky bibasilar atelectasis. No infiltrates, edema or effusions. The bony thorax is intact. IMPRESSION: Low lung volumes with vascular crowding and streaky bibasilar atelectasis. Electronically Signed   By: Marijo Sanes M.D.   On: 06/19/2019 07:02    Pertinent labs & imaging results that were available during my care of the patient were reviewed by me and considered in my medical decision making (see chart for details).  Medications Ordered in ED Medications  famotidine (PEPCID) IVPB 20 mg premix (has no administration in time range)  fentaNYL (SUBLIMAZE) injection 25-50 mcg (has no administration in time range)  ondansetron (ZOFRAN) injection 4 mg (4 mg Intravenous Given 06/19/19 0636)  sodium chloride flush (NS) 0.9 % injection 3 mL (3 mLs Intravenous Given 06/19/19 0637)  sodium chloride 0.9 % bolus 500 mL (0 mLs Intravenous Stopped 06/19/19 0300)  metoCLOPramide (REGLAN) injection 10 mg (10 mg Intravenous Given 06/19/19 0152)  alum & mag hydroxide-simeth (MAALOX/MYLANTA) 200-200-20 MG/5ML suspension 30 mL (30 mLs Oral Given 06/19/19 0154)    And  lidocaine (XYLOCAINE) 2 % viscous mouth solution 15 mL (15 mLs Oral Given 06/19/19 0154)  hyoscyamine (LEVSIN SL) SL tablet 0.25 mg (0.25 mg Sublingual Given 06/19/19 0153)  iohexol (OMNIPAQUE) 300 MG/ML solution 100 mL (100 mLs Intravenous Contrast Given 06/19/19 0443)  fentaNYL (SUBLIMAZE) injection 50 mcg (50 mcg Intravenous Given 06/19/19 0636)  metoCLOPramide (REGLAN) injection 5 mg (5 mg Intravenous Given 06/19/19 0636)  Procedures Procedures  (including critical care time)  Medical Decision Making / ED Course I have reviewed the nursing notes for this encounter and the patient's prior records (if  available in EHR or on provided paperwork).   [name] was evaluated in Emergency Department on 06/19/2019 for the symptoms described in the history of present illness. She was evaluated in the context of the global COVID-19 pandemic, which necessitated consideration that the patient might be at risk for infection with the SARS-CoV-2 virus that causes COVID-19. Institutional protocols and algorithms that pertain to the evaluation of patients at risk for COVID-19 are in a state of rapid change based on information released by regulatory bodies including the CDC and federal and state organizations. These policies and algorithms were followed during the patient's care in the ED.  Epigastric abdominal pain with streaky hematemesis. Pain is consistent with gastroparesis flare per patient.  She is notably tachycardic.  Not complaining of chest pain or shortness of breath.  Treated symptomatically with small IV fluid bolus, Reglan and GI cocktail which appeared to provide some relief.  Labs notable for hyperglycemia without evidence of DKA.  Stable renal dysfunction.  Mild leukocytosis.  No evidence of biliary obstruction or pancreatitis.  After treatment, patient remained tachycardic.  CT scan was repeated and was unchanged.   Given her persistent tachycardia, and chest x-ray will be obtained to assess for pneumomediastinum concerning for esophageal perforation though there is a very low suspicion. CXR without pneumothorax, pneumonia, or pneumomediastinum.  Low suspicion for PE.    Case was discussed with medicine regarding admission for further work-up and management.      Final Clinical Impression(s) / ED Diagnoses Final diagnoses:  Emesis  Epigastric abdominal pain  Nausea and vomiting in adult  Tachycardia      This chart was dictated using voice recognition software.  Despite best efforts to proofread,  errors can occur which can change the documentation meaning.   Fatima Blank, MD 06/19/19 [phone]

## 2019-06-19 NOTE — ED Notes (Addendum)
O2 sat decreased after receiving Fentanyl. Pt placed on 2L nasal cannula

## 2019-06-19 NOTE — ED Notes (Signed)
[facility] called for transport. 

## 2019-06-20 ENCOUNTER — Other Ambulatory Visit: Payer: Self-pay

## 2019-06-20 DIAGNOSIS — K920 Hematemesis: Secondary | ICD-10-CM | POA: Diagnosis present

## 2019-06-20 DIAGNOSIS — J302 Other seasonal allergic rhinitis: Secondary | ICD-10-CM | POA: Diagnosis present

## 2019-06-20 DIAGNOSIS — N186 End stage renal disease: Secondary | ICD-10-CM | POA: Diagnosis present

## 2019-06-20 DIAGNOSIS — D631 Anemia in chronic kidney disease: Secondary | ICD-10-CM | POA: Diagnosis present

## 2019-06-20 DIAGNOSIS — K219 Gastro-esophageal reflux disease without esophagitis: Secondary | ICD-10-CM | POA: Diagnosis present

## 2019-06-20 DIAGNOSIS — Z794 Long term (current) use of insulin: Secondary | ICD-10-CM | POA: Diagnosis not present

## 2019-06-20 DIAGNOSIS — Z940 Kidney transplant status: Secondary | ICD-10-CM | POA: Diagnosis not present

## 2019-06-20 DIAGNOSIS — E1065 Type 1 diabetes mellitus with hyperglycemia: Secondary | ICD-10-CM | POA: Diagnosis present

## 2019-06-20 DIAGNOSIS — Z905 Acquired absence of kidney: Secondary | ICD-10-CM | POA: Diagnosis not present

## 2019-06-20 DIAGNOSIS — R000 Tachycardia, unspecified: Secondary | ICD-10-CM | POA: Diagnosis present

## 2019-06-20 DIAGNOSIS — E860 Dehydration: Secondary | ICD-10-CM | POA: Diagnosis present

## 2019-06-20 DIAGNOSIS — R1110 Vomiting, unspecified: Secondary | ICD-10-CM | POA: Diagnosis present

## 2019-06-20 DIAGNOSIS — E1043 Type 1 diabetes mellitus with diabetic autonomic (poly)neuropathy: Secondary | ICD-10-CM | POA: Diagnosis present

## 2019-06-20 DIAGNOSIS — E8889 Other specified metabolic disorders: Secondary | ICD-10-CM | POA: Diagnosis present

## 2019-06-20 DIAGNOSIS — Q970 Karyotype 47, XXX: Secondary | ICD-10-CM | POA: Diagnosis not present

## 2019-06-20 DIAGNOSIS — Z20822 Contact with and (suspected) exposure to covid-19: Secondary | ICD-10-CM | POA: Diagnosis present

## 2019-06-20 DIAGNOSIS — Z992 Dependence on renal dialysis: Secondary | ICD-10-CM | POA: Diagnosis not present

## 2019-06-20 DIAGNOSIS — I120 Hypertensive chronic kidney disease with stage 5 chronic kidney disease or end stage renal disease: Secondary | ICD-10-CM | POA: Diagnosis present

## 2019-06-20 DIAGNOSIS — K3184 Gastroparesis: Secondary | ICD-10-CM | POA: Diagnosis present

## 2019-06-20 DIAGNOSIS — Z91013 Allergy to seafood: Secondary | ICD-10-CM | POA: Diagnosis not present

## 2019-06-20 DIAGNOSIS — F819 Developmental disorder of scholastic skills, unspecified: Secondary | ICD-10-CM | POA: Diagnosis present

## 2019-06-20 DIAGNOSIS — N2581 Secondary hyperparathyroidism of renal origin: Secondary | ICD-10-CM | POA: Diagnosis present

## 2019-06-20 DIAGNOSIS — Z90710 Acquired absence of both cervix and uterus: Secondary | ICD-10-CM | POA: Diagnosis not present

## 2019-06-20 DIAGNOSIS — R109 Unspecified abdominal pain: Secondary | ICD-10-CM | POA: Diagnosis not present

## 2019-06-20 DIAGNOSIS — E1022 Type 1 diabetes mellitus with diabetic chronic kidney disease: Secondary | ICD-10-CM | POA: Diagnosis present

## 2019-06-20 DIAGNOSIS — E785 Hyperlipidemia, unspecified: Secondary | ICD-10-CM | POA: Diagnosis present

## 2019-06-20 LAB — GLUCOSE, CAPILLARY
Glucose-Capillary: 151 mg/dL — ABNORMAL HIGH (ref 70–99)
Glucose-Capillary: 149 mg/dL — ABNORMAL HIGH (ref 70–99)
Glucose-Capillary: 149 mg/dL — ABNORMAL HIGH (ref 70–99)
Glucose-Capillary: 185 mg/dL — ABNORMAL HIGH (ref 70–99)
Glucose-Capillary: 168 mg/dL — ABNORMAL HIGH (ref 70–99)

## 2019-06-20 LAB — COMPREHENSIVE METABOLIC PANEL
ALT: 36 U/L (ref 0–44)
AST: 39 U/L (ref 15–41)
Albumin: 3.5 g/dL (ref 3.5–5.0)
Alkaline Phosphatase: 57 U/L (ref 38–126)
Anion gap: 20 — ABNORMAL HIGH (ref 5–15)
BUN: 49 mg/dL — ABNORMAL HIGH (ref 6–20)
CO2: 24 mmol/L (ref 22–32)
Calcium: 7.1 mg/dL — ABNORMAL LOW (ref 8.9–10.3)
Chloride: 91 mmol/L — ABNORMAL LOW (ref 98–111)
Creatinine, Ser: 9.49 mg/dL — ABNORMAL HIGH (ref 0.44–1.00)
GFR calc Af Amer: 6 mL/min — ABNORMAL LOW (ref >60)
GFR calc non Af Amer: 5 mL/min — ABNORMAL LOW (ref >60)
Glucose, Bld: 140 mg/dL — ABNORMAL HIGH (ref 70–99)
Potassium: 4.2 mmol/L (ref 3.5–5.1)
Sodium: 135 mmol/L (ref 135–145)
Total Bilirubin: 0.8 mg/dL (ref 0.3–1.2)
Total Protein: 7.6 g/dL (ref 6.5–8.1)

## 2019-06-20 LAB — CBC
HCT: 32.7 % — ABNORMAL LOW (ref 36.0–46.0)
Hemoglobin: 10.2 g/dL — ABNORMAL LOW (ref 12.0–15.0)
MCH: 31.6 pg (ref 26.0–34.0)
MCHC: 31.2 g/dL (ref 30.0–36.0)
MCV: 101.2 fL — ABNORMAL HIGH (ref 80.0–100.0)
Platelets: 168 10*3/uL (ref 150–400)
RBC: 3.23 MIL/uL — ABNORMAL LOW (ref 3.87–5.11)
RDW: 17.4 % — ABNORMAL HIGH (ref 11.5–15.5)
WBC: 13.6 10*3/uL — ABNORMAL HIGH (ref 4.0–10.5)
nRBC: 0 % (ref 0.0–0.2)

## 2019-06-20 MED ORDER — INSULIN ASPART 100 UNIT/ML ~~LOC~~ SOLN
0.0000 [IU] | Freq: Four times a day (QID) | SUBCUTANEOUS | Status: DC
  Administered 2019-06-20 (×2): 1 [IU] via SUBCUTANEOUS

## 2019-06-20 MED ORDER — METOCLOPRAMIDE HCL 5 MG/ML IJ SOLN
10.0000 mg | INTRAMUSCULAR | Status: DC
  Administered 2019-06-20 – 2019-06-22 (×11): 10 mg via INTRAVENOUS
  Filled 2019-06-20 (×11): qty 2

## 2019-06-20 MED ORDER — FENTANYL CITRATE (PF) 100 MCG/2ML IJ SOLN
25.0000 ug | INTRAMUSCULAR | Status: DC | PRN
  Administered 2019-06-20: 25 ug via INTRAVENOUS
  Filled 2019-06-20: qty 2

## 2019-06-20 NOTE — Progress Notes (Signed)
Patient admitted from [facility] ED via [facility] to [place]. Report received from the [facility] staff,no report received from [facility] ED staff. Patient is alert and oriented complaining of abdominal pain,V/S checked,attached to cardiac monitoring,CCMD notified.Oriented to room,admitting provider notified.Will continue to closely monitor the patient.

## 2019-06-20 NOTE — Consult Note (Signed)
[facility] Renal Consultation Note    Indication for Consultation:  Management of ESRD/hemodialysis; anemia, hypertension/volume and secondary hyperparathyroidism  HPI: [name] is a 34 y.o. 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 S/p removal of drains. Admitted Obs 4/4 with abd pain, nausea/vomiting. CT Abd 4/3 with near complete resolution of previous air/fluid collection.   Now readmitted with intractable abd pain, nausea, hematemesis x1 day. Reports abd pain and nausea after dialysis Monday. Repeat CT showing esophagitis. Has received 1L IVF bolus here. Labs  K 4.2 BUN 49 Cr 9.49  Ca 7.1 Alb 3.5 Hgb 10.2 WBC 13.6    Dialyzes MWF at [facility]. Last dialyzed 4/5. She has been compliant with treatments.   Past Medical History:  Diagnosis Date  . Anemia   . Chronic kidney disease    kidney transplant 07  . Diabetes mellitus    Pt reports diagnosis in June 2011, Type 2  . GERD (gastroesophageal reflux disease)   . Hyperlipidemia   . Hypertension   . 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 Pyelonephritis 06/23/2014  . Seasonal allergies   . UTI (urinary tract infection) 01/09/2015  . 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 ARTERIOVENOUS GRAFT PLACEMENT Bilateral    "neither work" (10/24/2017)  . AV FISTULA PLACEMENT Left 10/26/2018   Procedure: CREATION OF ARTERIOVENOUS FISTULA  LEFT ARM;  Surgeon: [name], MD;  Location: [place];  Service: Vascular;  Laterality: Left;  . BASCILIC VEIN TRANSPOSITION Left 12/21/2018   Procedure: Left arm BASILIC VEIN TRANSPOSITION SECOND STAGE;  Surgeon: [name], MD;  Location: [place];  Service: Vascular;   Laterality: Left;  . CHOLECYSTECTOMY N/A 06/30/2017   Procedure: LAPAROSCOPIC CHOLECYSTECTOMY WITH INTRAOPERATIVE CHOLANGIOGRAM;  Surgeon: Excell Seltzer, MD;  Location: [facility];  Service: General;  Laterality: N/A;  . ESOPHAGOGASTRODUODENOSCOPY (EGD) WITH PROPOFOL N/A 07/04/2017   Procedure: ESOPHAGOGASTRODUODENOSCOPY (EGD) WITH PROPOFOL;  Surgeon: Clarene Essex, MD;  Location: [facility];  Service: Endoscopy;  Laterality: N/A;  . INSERTION OF DIALYSIS CATHETER N/A 03/20/2018   Procedure: INSERTION OF TUNNELED DIALYSIS CATHETER - RIGHT INTERANL JUGULAR PLACEMENT;  Surgeon: Angelia Mould, MD;  Location: [place];  Service: Vascular;  Laterality: N/A;  . IR GUIDED [place]  10/28/2018  . KIDNEY TRANSPLANT  2007  . PARATHYROIDECTOMY  ?2012   "3/4 removed" (10/24/2017)  . RENAL BIOPSY Bilateral 2003  . UPPER EXTREMITY VENOGRAPHY Bilateral 10/19/2018   Procedure: UPPER EXTREMITY VENOGRAPHY;  Surgeon: [name], MD;  Location: [place] [facility];  Service: Cardiovascular;  Laterality: Bilateral;  Bilateral    Family History  Problem Relation Age of Onset  . Arthritis Mother   . Hypertension Mother   . Aneurysm Mother        died of brain aneurysm  . CAD Father        Has 3 stents  . Diabetes Father        borderline  . Other Brother        Died in war   Social History:  reports that she has never smoked. She has never used smokeless tobacco. She reports that she does not drink alcohol or  use drugs. Allergies  Allergen Reactions  . Benadryl [Diphenhydramine-Zinc Acetate] Shortness Of Breath  . Diphenhydramine Hcl Anaphylaxis    REACTION: Stopped breathing in ICU  . Doxycycline Shortness Of Breath  . Motrin [Ibuprofen] Shortness Of Breath and Itching    Per pt  . Banana Other (See Comments)    Sick on the stomach  . Iron Dextran Other (See Comments)    REACTION: vein irritation  . Shellfish Allergy Hives  . Chlorhexidine Itching   Prior to  Admission medications   Medication Sig Start Date End Date Taking? Authorizing Provider  acetaminophen (TYLENOL) 500 MG tablet Take 1,000 mg by mouth every 6 (six) hours as needed for moderate pain (pain).    Yes [provider]  b complex-vitamin c-folic acid (NEPHRO-VITE) 0.8 MG TABS tablet Take 1 tablet by mouth every Monday, Wednesday, and Friday. After dialysis on dialysis days 07/19/18  Yes [provider]  calcium acetate (PHOSLO) 667 MG capsule Take 1,334 mg by mouth 2 (two) times daily. Taking 1 capsule with a snack 11/24/18  Yes [provider]  Calcium Carbonate Antacid (CALCIUM CARBONATE, DOSED IN MG ELEMENTAL CALCIUM,) 1250 MG/5ML SUSP Take 5 mLs by mouth 3 (three) times daily.  11/17/18  Yes [provider]  cetirizine (ZYRTEC) 10 MG tablet Take 10 mg by mouth daily as needed for allergies.   Yes [provider]  D 1000 25 MCG (1000 UT) capsule Take 1,000 Units by mouth every other day.  04/27/19  Yes [provider]  diclofenac Sodium (VOLTAREN) 1 % GEL Apply 2 g topically 3 (three) times daily as needed (pain).  04/19/19  Yes [provider]  dicyclomine (BENTYL) 20 MG tablet Take 1 tablet (20 mg total) by mouth 2 (two) times daily. Patient taking differently: Take 20 mg by mouth 2 (two) times daily as needed (stomach issues.).  02/06/18  Yes [name], Ripudeep K, MD  fluticasone (FLONASE) 50 MCG/ACT nasal spray Place 2 sprays into both nostrils daily as needed for allergies. 09/03/18  Yes [name], MD  gabapentin (NEURONTIN) 400 MG capsule Take 400 mg by mouth 3 (three) times daily. 04/29/19  Yes [provider]  hydrOXYzine (ATARAX/VISTARIL) 50 MG tablet Take 1 tablet (50 mg total) by mouth 2 (two) times daily as needed for itching. 02/06/18  Yes [name], Ripudeep K, MD  insulin aspart (NOVOLOG FLEXPEN) 100 UNIT/ML FlexPen Inject 2-10 Units into the skin 3 (three) times daily with meals. as needed for blood sugar management  (sliding scale)   Yes [provider]  insulin degludec (TRESIBA) 100 UNIT/ML SOPN FlexTouch Pen Inject 0.6 mLs (60 Units total) into the skin 2 (two) times daily. Reports taking 24 units QAM Patient taking differently: Inject 20 Units into the skin daily.  03/06/18  Yes Charlynne Cousins, MD  loperamide (IMODIUM) 2 MG capsule Take 2 mg by mouth 4 (four) times daily as needed for diarrhea or loose stools.    Yes [provider]  metoCLOPramide (REGLAN) 10 MG tablet Take 1 tablet (10 mg total) by mouth 3 (three) times daily before meals. Patient taking differently: Take 10 mg by mouth 3 (three) times daily as needed (stomach issues.).  02/06/18  Yes [name], Ripudeep K, MD  metoprolol tartrate (LOPRESSOR) 25 MG tablet Take 1 tablet (25 mg total) by mouth 2 (two) times daily. Patient taking differently: Take 25 mg by mouth daily.  02/07/19 06/19/19 Yes Wyvonnia Dusky, MD  pantoprazole (PROTONIX) 40 MG tablet Take 40  mg by mouth See admin instructions. Once to twice daily   Yes [provider]  SANTYL ointment Apply 1 application topically daily. 11/18/18  Yes [provider]  simvastatin (ZOCOR) 20 MG tablet Take 20 mg by mouth at bedtime.    Yes [provider]  ACCU-CHEK SOFTCLIX LANCETS lancets Use to check blood sugar 4 times per day. 12/29/15   Renato Shin, MD  amLODipine (NORVASC) 10 MG tablet Take 1 tablet (10 mg total) by mouth daily. 03/25/18 02/07/19  Thurnell Lose, MD   Current Facility-Administered Medications  Medication Dose Route Frequency Provider Last Rate Last Admin  . famotidine (PEPCID) IVPB 20 mg premix  20 mg Intravenous Daily Reubin Milan, MD 100 mL/hr at 06/20/19 1029 20 mg at 06/20/19 1029  . insulin aspart (novoLOG) injection 0-6 Units  0-6 Units Subcutaneous Q6H Dessa Phi, DO   1 Units at 06/20/19 1208  . insulin glargine (LANTUS) injection 20 Units  20 Units Subcutaneous Daily Reubin Milan, MD   20 Units at  06/20/19 [phone]  . metoCLOPramide (REGLAN) injection 10 mg  10 mg Intravenous Q4H Dessa Phi, DO   10 mg at 06/20/19 1208  . metoprolol tartrate (LOPRESSOR) injection 5 mg  5 mg Intravenous Q8H Reubin Milan, MD   Stopped at 06/20/19 1400  . ondansetron (ZOFRAN) injection 4 mg  4 mg Intravenous Q6H PRN Reubin Milan, MD   4 mg at 06/19/19 0636  . pantoprazole (PROTONIX) injection 40 mg  40 mg Intravenous Q24H Reubin Milan, MD   40 mg at 06/20/19 0831     ROS: As per HPI otherwise negative.  Physical Exam: Vitals:   06/20/19 0005 06/20/19 0414 06/20/19 0834 06/20/19 1118  BP: (!) 136/95 98/72 124/88 119/84  Pulse: 98 98  96  Resp: 19 17 20 15   Temp: 98.9 F (37.2 C) 99 F (37.2 C) 98.9 F (37.2 C) 98.7 F (37.1 C)  TempSrc: Oral Oral Oral Oral  SpO2: 100% 98% 95% 90%  Weight:      Height:         General: WDWN female, nad, appears uncomfortable  Head: NCAT sclera not icteric  Neck: Supple. No JVD  Lungs: CTA bilaterally without wheezes, rales, or rhonchi. Breathing is unlabored. Heart: RRR with S1 S2 Abdomen: soft  diffusely tender, not localized.  Lower extremities:without edema or ischemic changes, no open wounds  Neuro: A & O  X 3. Moves all extremities spontaneously. Psych:  Responds to questions appropriately with a normal affect. Dialysis Access: LUE AVF +bruit   Labs: Basic Metabolic Panel: Recent Labs  Lab 06/15/19 1427 06/15/19 1501 06/17/19 0234 06/17/19 0234 06/18/19 2325 06/19/19 0142 06/20/19 0531  NA 140   < > 137   < > 138 136 135  K 4.8   < > 5.8*   < > 4.6 4.3 4.2  CL 98   < > 95*   < > 87* 89* 91*  CO2 24   < > 25  --  32  --  24  GLUCOSE 281*   < > 179*   < > 296* 300* 140*  BUN 59*   < > 52*   < > 31* 30* 49*  CREATININE 10.34*   < > 8.26*   < > 6.49* 6.70* 9.49*  CALCIUM 6.8*   < > 8.0*  --  9.6  --  7.1*  PHOS 7.0*  --  6.5*  --   --   --   --    < > =  values in this interval not displayed.   Liver Function  Tests: Recent Labs  Lab 06/17/19 0234 06/18/19 2325 06/20/19 0531  AST 19 23 39  ALT 14 18 36  ALKPHOS 58 72 57  BILITOT 0.8 0.7 0.8  PROT 8.1 10.1* 7.6  ALBUMIN 3.8 4.9 3.5   Recent Labs  Lab 06/15/19 0735 06/18/19 2325  LIPASE 36 37   No results for input(s): AMMONIA in the last 168 hours. CBC: Recent Labs  Lab 06/16/19 1439 06/16/19 1647 06/17/19 0234 06/17/19 0234 06/18/19 2325 06/18/19 2325 06/19/19 0142 06/19/19 0835 06/20/19 0531  WBC 9.2   < > 9.9   < > 11.0*  --   --  10.2 13.6*  HGB 13.2   < > 10.8*   < > 12.7   < > 14.6 12.0 10.2*  HCT 40.6   < > 34.2*   < > 38.0   < > 43.0 37.0 32.7*  MCV 99.0  --  101.2*  --  97.2  --   --  99.2 101.2*  PLT 180   < > 181   < > 191  --   --  189 168   < > = values in this interval not displayed.   Cardiac Enzymes: Recent Labs  Lab 06/16/19 2121  CKTOTAL 61   CBG: Recent Labs  Lab 06/19/19 2025 06/20/19 0229 06/20/19 0601 06/20/19 0836 06/20/19 1117  GLUCAP 253* 151* 149* 149* 185*   Iron Studies: No results for input(s): IRON, TIBC, TRANSFERRIN, FERRITIN in the last 72 hours. Studies/Results: CT ABDOMEN PELVIS W CONTRAST  Result Date: 06/19/2019 CLINICAL DATA:  Nausea and vomiting with abdominal pain EXAM: CT ABDOMEN AND PELVIS WITH CONTRAST TECHNIQUE: Multidetector CT imaging of the abdomen and pelvis was performed using the standard protocol following bolus administration of intravenous contrast. CONTRAST:  132mL OMNIPAQUE IOHEXOL 300 MG/ML  SOLN COMPARISON:  CT from 3 days ago FINDINGS: Lower chest: Lower esophageal low-density wall thickening which may be related to the history of vomiting. Hepatobiliary: No focal liver abnormality.Cholecystectomy. No bile duct dilatation. Pancreas: Unremarkable. Spleen: Unremarkable. Adrenals/Urinary Tract: Negative adrenals. 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 There is history of renal transplant to the right lower quadrant with architectural distortion and  sheet like scarring in this area with tiny gas bubbles that are unchanged. No fluid collection or regional inflammatory changes. Negative collapsed bladder. Stomach/Bowel:  No obstruction. No appendicitis. Vascular/Lymphatic: No acute vascular abnormality. No mass or adenopathy. Reproductive:Hysterectomy. Other: No ascites or pneumoperitoneum. Musculoskeletal: No acute abnormalities. IMPRESSION: 1. Lower esophageal thickening, likely esophagitis in this patient with history of vomiting. 2. Stable intra-abdominal imaging compared to 3 days ago. No underlying bowel obstruction. 3. Stable postsurgical changes with tiny gas bubbles in the right lower quadrant, site of prior renal transplant. Electronically Signed   By: Monte Fantasia M.D.   On: 06/19/2019 05:09   DG Chest Port 1 View  Result Date: 06/19/2019 CLINICAL DATA:  Vomiting for 3 days. EXAM: PORTABLE CHEST 1 VIEW COMPARISON:  Chest x-ray 06/15/2019 FINDINGS: The cardiac silhouette, mediastinal and hilar contours are within normal limits given the AP projection, portable technique and low lung volumes. Low lung volumes with vascular crowding and streaky bibasilar atelectasis. No infiltrates, edema or effusions. The bony thorax is intact. IMPRESSION: Low lung volumes with vascular crowding and streaky bibasilar atelectasis. Electronically Signed   By: Marijo Sanes M.D.   On: 06/19/2019 07:02    Dialysis Orders:  [facility] MWF 350/800 EDW  80 kg 2K/3.5Ca UFP 4 AVF Heparin 4000+2000 midrun  Mircera 100 last 4/5  Assessment/Plan: 1. Abd pain/nausea/hematemisis. Hx gastroparesis. Per primary  2. ESRD -  HD MWF. Orders written for HD today on schedule.  3. Hypertension/volume  - BP controlled. No volume on exam. Under EDW by weights here. Minimal UF on HD  4. Anemia  - Hgb 10.2. Recent ESA dose as outpatient. Follow trends.  5. Metabolic bone disease -  Ca low. On added Ca bath. Continue binders as tolerated . Not on VDRA.  6. Nutrition - Renal diet when  eating.  7. DMT1 - insulin per primary  8. S/p transplant nephrectomy    Lynnda Child PA-C [place] Pager [phone] 06/20/2019, 12:30 PM

## 2019-06-20 NOTE — Progress Notes (Signed)
Pt had a episode of vomitting x1. zofran given.   Lavenia Atlas, RN

## 2019-06-20 NOTE — Progress Notes (Signed)
PROGRESS NOTE    [name]  IWL:[id number] DOB: [birth year] DOA: 06/19/2019 PCP: [name], MD     Brief Narrative:  [name] is a 34 y.o. female with medical history significant of ESRD on HD MWF, history of kidney transplant recipient, pyelonephritis, anemia of ESRD, type 1 diabetes, GERD, gastroparesis, hyperlipidemia, hypertension, pseudoseizures, seasonal allergies, history of UTI, triple X syndrome who was recently admitted from 06/16/2019 until 06/17/2019 due to exacerbation of gastroparesis now returning to the ED after finishing her HD she became nauseous and developed sharp abdominal pain.  She vomited at least 15 times yesterday evening with some bloody streaks.  She denies diarrhea or constipation (last BM was on Sunday).  CT abdomen/pelvis showed a lower esophageal thickening, likely esophagitis due to history of vomiting.  There was stable intra-abdominal imaging when compared to the 3 days ago.  There was no bowel obstruction stable postsurgical changes with tiny gas bubbles in the right lower quadrant side of prior renal transplant. She was re-admitted for gastroparesis.   New events last 24 hours / Subjective: Admits to sharp abdominal pain.  No further nausea or vomiting since admission.  States that she is very dehydrated, asking for something to drink.  Assessment & Plan:   Principal Problem:   Hematemesis Active Problems:   Anemia due to chronic kidney disease   Essential hypertension, benign   Type 1 diabetes mellitus with complication, uncontrolled (HCC)   Dehydration   Gastroparesis   ESRD on hemodialysis (HCC)   Intractable abdominal pain   Sinus tachycardia   Abdominal pain, nausea, vomiting -Hematemesis likely secondary to recurrent vomiting.  Nausea and vomiting has now resolved -Abdominal pain secondary to gastroparesis -Advance to clear liquid diet today   Gastroparesis -Continue Reglan  ESRD on HD MWF -Nephrology consulted  Anemia  secondary to chronic kidney disease -Monitor CBC   Essential hypertension -Continue metoprolol  Type 1 diabetes -Continue Lantus, sliding scale insulin   DVT prophylaxis: SCD  Code Status: Full code Family Communication: No family at bedside Disposition Plan:  . Patient is from home prior to admission. . Currently in-hospital treatment needed due to continued abdominal pain, advance to clear liquid diet and monitor for nausea and vomiting today.  Nephrology consulted for dialysis. . Suspect patient will discharge home in 1 to 2 days.    Consultants:   Nephrology  Procedures:   None  Antimicrobials:  Anti-infectives (From admission, onward)   None        Objective: Vitals:   06/19/19 2020 06/20/19 0005 06/20/19 0414 06/20/19 0834  BP: (!) 142/98 (!) 136/95 98/72 124/88  Pulse:  98 98   Resp: 17 19 17 20   Temp: 98.3 F (36.8 C) 98.9 F (37.2 C) 99 F (37.2 C) 98.9 F (37.2 C)  TempSrc: Oral Oral Oral Oral  SpO2: 100% 100% 98% 95%  Weight: 78 kg     Height:       No intake or output data in the 24 hours ending 06/20/19 1052 Filed Weights   06/18/19 2308 06/19/19 2020  Weight: 81.6 kg 78 kg    Examination:  General exam: Appears calm but uncomfortable  Respiratory system: Clear to auscultation. Respiratory effort normal. No respiratory distress. No conversational dyspnea.  Cardiovascular system: S1 & S2 heard, RRR. No murmurs. No pedal edema. Gastrointestinal system: Abdomen is nondistended, soft and TTP lower quadrants. Central nervous system: Alert and oriented. No focal neurological deficits. Speech clear.  Extremities: Symmetric in appearance  Skin:  No rashes, lesions or ulcers on exposed skin  Psychiatry: Judgement and insight appear normal. Mood & affect appropriate.   Data Reviewed: I have personally reviewed following labs and imaging studies  CBC: Recent Labs  Lab 06/16/19 1439 06/16/19 1647 06/17/19 0234 06/18/19 2325 06/19/19 0142  06/19/19 0835 06/20/19 0531  WBC 9.2  --  9.9 11.0*  --  10.2 13.6*  HGB 13.2   < > 10.8* 12.7 14.6 12.0 10.2*  HCT 40.6   < > 34.2* 38.0 43.0 37.0 32.7*  MCV 99.0  --  101.2* 97.2  --  99.2 101.2*  PLT 180  --  181 191  --  189 168   < > = values in this interval not displayed.   Basic Metabolic Panel: Recent Labs  Lab 06/15/19 1427 06/15/19 1501 06/16/19 1439 06/16/19 1439 06/16/19 1647 06/17/19 0234 06/18/19 2325 06/19/19 0142 06/20/19 0531  NA 140   < > 137   < > 138 137 138 136 135  K 4.8   < > 5.3*   < > 5.2* 5.8* 4.6 4.3 4.2  CL 98   < > 93*  --   --  95* 87* 89* 91*  CO2 24  --  26  --   --  25 32  --  24  GLUCOSE 281*   < > 387*  --   --  179* 296* 300* 140*  BUN 59*   < > 37*  --   --  52* 31* 30* 49*  CREATININE 10.34*   < > 7.42*  --   --  8.26* 6.49* 6.70* 9.49*  CALCIUM 6.8*  --  9.5  --   --  8.0* 9.6  --  7.1*  MG  --   --   --   --   --  2.2  --   --   --   PHOS 7.0*  --   --   --   --  6.5*  --   --   --    < > = values in this interval not displayed.   GFR: Estimated Creatinine Clearance: 8.7 mL/min (A) (by C-G formula based on SCr of 9.49 mg/dL (H)). Liver Function Tests: Recent Labs  Lab 06/15/19 0735 06/15/19 0735 06/15/19 1427 06/16/19 1439 06/17/19 0234 06/18/19 2325 06/20/19 0531  AST 55*  --   --  27 19 23  39  ALT 17  --   --  20 14 18  36  ALKPHOS 53  --   --  75 58 72 57  BILITOT 0.4  --   --  0.8 0.8 0.7 0.8  PROT 7.1  --   --  9.9* 8.1 10.1* 7.6  ALBUMIN 3.5   < > 3.2* 4.5 3.8 4.9 3.5   < > = values in this interval not displayed.   Recent Labs  Lab 06/15/19 0735 06/18/19 2325  LIPASE 36 37   No results for input(s): AMMONIA in the last 168 hours. Coagulation Profile: Recent Labs  Lab 06/19/19 0835  INR 1.1   Cardiac Enzymes: Recent Labs  Lab 06/16/19 2121  CKTOTAL 61   BNP (last 3 results) No results for input(s): PROBNP in the last 8760 hours. HbA1C: No results for input(s): HGBA1C in the last 72  hours. CBG: Recent Labs  Lab 06/19/19 1843 06/19/19 2025 06/20/19 0229 06/20/19 0601 06/20/19 0836  GLUCAP 227* 253* 151* 149* 149*   Lipid Profile: No results for input(s): CHOL, HDL, LDLCALC,  TRIG, CHOLHDL, LDLDIRECT in the last 72 hours. Thyroid Function Tests: No results for input(s): TSH, T4TOTAL, FREET4, T3FREE, THYROIDAB in the last 72 hours. Anemia Panel: No results for input(s): VITAMINB12, FOLATE, FERRITIN, TIBC, IRON, RETICCTPCT in the last 72 hours. Sepsis Labs: Recent Labs  Lab 06/16/19 2121  LATICACIDVEN 2.6*    Recent Results (from the past 240 hour(s))  Respiratory Panel by RT PCR (Flu A&B, Covid) - Nasopharyngeal Swab     Status: None   Collection Time: 06/15/19 11:40 AM   Specimen: Nasopharyngeal Swab  Result Value Ref Range Status   SARS Coronavirus 2 by RT PCR NEGATIVE NEGATIVE Final    Comment: (NOTE) SARS-CoV-2 target nucleic acids are NOT DETECTED. The SARS-CoV-2 RNA is generally detectable in upper respiratoy specimens during the acute phase of infection. The lowest concentration of SARS-CoV-2 viral copies this assay can detect is 131 copies/mL. A negative result does not preclude SARS-Cov-2 infection and should not be used as the sole basis for treatment or other patient management decisions. A negative result may occur with  improper specimen collection/handling, submission of specimen other than nasopharyngeal swab, presence of viral mutation(s) within the areas targeted by this assay, and inadequate number of viral copies (<131 copies/mL). A negative result must be combined with clinical observations, patient history, and epidemiological information. The expected result is Negative. Fact Sheet for Patients:  PinkCheek.be Fact Sheet for Healthcare Providers:  GravelBags.it This test is not yet ap proved or cleared by the [place] FDA and  has been authorized for detection and/or  diagnosis of SARS-CoV-2 by FDA under an Emergency Use Authorization (EUA). This EUA will remain  in effect (meaning this test can be used) for the duration of the COVID-19 declaration under Section 564(b)(1) of the Act, 21 U.S.C. section 360bbb-3(b)(1), unless the authorization is terminated or revoked sooner.    Influenza A by PCR NEGATIVE NEGATIVE Final   Influenza B by PCR NEGATIVE NEGATIVE Final    Comment: (NOTE) The Xpert Xpress SARS-CoV-2/FLU/RSV assay is intended as an aid in  the diagnosis of influenza from Nasopharyngeal swab specimens and  should not be used as a sole basis for treatment. Nasal washings and  aspirates are unacceptable for Xpert Xpress SARS-CoV-2/FLU/RSV  testing. Fact Sheet for Patients: PinkCheek.be Fact Sheet for Healthcare Providers: GravelBags.it This test is not yet approved or cleared by the [place] FDA and  has been authorized for detection and/or diagnosis of SARS-CoV-2 by  FDA under an Emergency Use Authorization (EUA). This EUA will remain  in effect (meaning this test can be used) for the duration of the  Covid-19 declaration under Section 564(b)(1) of the Act, 21  U.S.C. section 360bbb-3(b)(1), unless the authorization is  terminated or revoked. Performed at [facility], [place] [address], [place], [place] [address]   SARS CORONAVIRUS 2 (TAT 6-24 HRS) Nasopharyngeal Nasopharyngeal Swab     Status: None   Collection Time: 06/19/19  6:45 AM   Specimen: Nasopharyngeal Swab  Result Value Ref Range Status   SARS Coronavirus 2 NEGATIVE NEGATIVE Final    Comment: (NOTE) SARS-CoV-2 target nucleic acids are NOT DETECTED. The SARS-CoV-2 RNA is generally detectable in upper and lower respiratory specimens during the acute phase of infection. Negative results do not preclude SARS-CoV-2 infection, do not rule out co-infections with other pathogens, and should not be used as the sole  basis for treatment or other patient management decisions. Negative results must be combined with clinical observations, patient history, and epidemiological  information. The expected result is Negative. Fact Sheet for Patients: SugarRoll.be Fact Sheet for Healthcare Providers: https://www.woods-mathews.com/ This test is not yet approved or cleared by the [place] FDA and  has been authorized for detection and/or diagnosis of SARS-CoV-2 by FDA under an Emergency Use Authorization (EUA). This EUA will remain  in effect (meaning this test can be used) for the duration of the COVID-19 declaration under Section 56 4(b)(1) of the Act, 21 U.S.C. section 360bbb-3(b)(1), unless the authorization is terminated or revoked sooner. Performed at [facility], [place] [address], [place], [place] [address]       Radiology Studies: CT ABDOMEN PELVIS W CONTRAST  Result Date: 06/19/2019 CLINICAL DATA:  Nausea and vomiting with abdominal pain EXAM: CT ABDOMEN AND PELVIS WITH CONTRAST TECHNIQUE: Multidetector CT imaging of the abdomen and pelvis was performed using the standard protocol following bolus administration of intravenous contrast. CONTRAST:  157mL OMNIPAQUE IOHEXOL 300 MG/ML  SOLN COMPARISON:  CT from 3 days ago FINDINGS: Lower chest: Lower esophageal low-density wall thickening which may be related to the history of vomiting. Hepatobiliary: No focal liver abnormality.Cholecystectomy. No bile duct dilatation. Pancreas: Unremarkable. Spleen: Unremarkable. Adrenals/Urinary Tract: Negative adrenals. 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 There is history of renal transplant to the right lower quadrant with architectural distortion and sheet like scarring in this area with tiny gas bubbles that are unchanged. No fluid collection or regional inflammatory changes. Negative collapsed bladder. Stomach/Bowel:  No obstruction. No appendicitis.  Vascular/Lymphatic: No acute vascular abnormality. No mass or adenopathy. Reproductive:Hysterectomy. Other: No ascites or pneumoperitoneum. Musculoskeletal: No acute abnormalities. IMPRESSION: 1. Lower esophageal thickening, likely esophagitis in this patient with history of vomiting. 2. Stable intra-abdominal imaging compared to 3 days ago. No underlying bowel obstruction. 3. Stable postsurgical changes with tiny gas bubbles in the right lower quadrant, site of prior renal transplant. Electronically Signed   By: Monte Fantasia M.D.   On: 06/19/2019 05:09   DG Chest Port 1 View  Result Date: 06/19/2019 CLINICAL DATA:  Vomiting for 3 days. EXAM: PORTABLE CHEST 1 VIEW COMPARISON:  Chest x-ray 06/15/2019 FINDINGS: The cardiac silhouette, mediastinal and hilar contours are within normal limits given the AP projection, portable technique and low lung volumes. Low lung volumes with vascular crowding and streaky bibasilar atelectasis. No infiltrates, edema or effusions. The bony thorax is intact. IMPRESSION: Low lung volumes with vascular crowding and streaky bibasilar atelectasis. Electronically Signed   By: Marijo Sanes M.D.   On: 06/19/2019 07:02      Scheduled Meds: . insulin aspart  0-6 Units Subcutaneous Q6H  . insulin glargine  20 Units Subcutaneous Daily  . metoCLOPramide (REGLAN) injection  5 mg Intravenous Q4H  . metoprolol tartrate  5 mg Intravenous Q8H  . pantoprazole (PROTONIX) IV  40 mg Intravenous Q24H   Continuous Infusions: . famotidine (PEPCID) IV 20 mg (06/20/19 1029)     LOS: 0 days      Time spent: 40 minutes   Dessa Phi, DO [facility] 06/20/2019, 10:52 AM   Available via Epic secure chat 7am-7pm After these hours, please refer to coverage provider listed on [email or web address]

## 2019-06-21 LAB — BASIC METABOLIC PANEL
Anion gap: 13 (ref 5–15)
BUN: 22 mg/dL — ABNORMAL HIGH (ref 6–20)
CO2: 26 mmol/L (ref 22–32)
Calcium: 7.7 mg/dL — ABNORMAL LOW (ref 8.9–10.3)
Chloride: 98 mmol/L (ref 98–111)
Creatinine, Ser: 6.08 mg/dL — ABNORMAL HIGH (ref 0.44–1.00)
GFR calc Af Amer: 10 mL/min — ABNORMAL LOW (ref >60)
GFR calc non Af Amer: 8 mL/min — ABNORMAL LOW (ref >60)
Glucose, Bld: 155 mg/dL — ABNORMAL HIGH (ref 70–99)
Potassium: 3.8 mmol/L (ref 3.5–5.1)
Sodium: 137 mmol/L (ref 135–145)

## 2019-06-21 LAB — GLUCOSE, CAPILLARY
Glucose-Capillary: 103 mg/dL — ABNORMAL HIGH (ref 70–99)
Glucose-Capillary: 138 mg/dL — ABNORMAL HIGH (ref 70–99)
Glucose-Capillary: 142 mg/dL — ABNORMAL HIGH (ref 70–99)
Glucose-Capillary: 148 mg/dL — ABNORMAL HIGH (ref 70–99)
Glucose-Capillary: 156 mg/dL — ABNORMAL HIGH (ref 70–99)

## 2019-06-21 LAB — CBC
HCT: 35 % — ABNORMAL LOW (ref 36.0–46.0)
Hemoglobin: 11 g/dL — ABNORMAL LOW (ref 12.0–15.0)
MCH: 31.5 pg (ref 26.0–34.0)
MCHC: 31.4 g/dL (ref 30.0–36.0)
MCV: 100.3 fL — ABNORMAL HIGH (ref 80.0–100.0)
Platelets: 191 10*3/uL (ref 150–400)
RBC: 3.49 MIL/uL — ABNORMAL LOW (ref 3.87–5.11)
RDW: 17.3 % — ABNORMAL HIGH (ref 11.5–15.5)
WBC: 7.8 10*3/uL (ref 4.0–10.5)
nRBC: 0 % (ref 0.0–0.2)

## 2019-06-21 MED ORDER — PANTOPRAZOLE SODIUM 40 MG PO TBEC
40.0000 mg | DELAYED_RELEASE_TABLET | Freq: Every day | ORAL | Status: DC
  Administered 2019-06-21 – 2019-06-22 (×2): 40 mg via ORAL
  Filled 2019-06-21 (×2): qty 1

## 2019-06-21 MED ORDER — ACETAMINOPHEN 325 MG PO TABS
650.0000 mg | ORAL_TABLET | Freq: Four times a day (QID) | ORAL | Status: DC | PRN
  Administered 2019-06-21: 07:00:00 650 mg via ORAL
  Filled 2019-06-21: qty 2

## 2019-06-21 MED ORDER — INSULIN ASPART 100 UNIT/ML ~~LOC~~ SOLN: 0.0000 [IU] | Freq: Three times a day (TID) | SUBCUTANEOUS | Status: DC

## 2019-06-21 MED ORDER — LABETALOL HCL 5 MG/ML IV SOLN
10.0000 mg | INTRAVENOUS | Status: DC | PRN
  Administered 2019-06-21: 18:00:00 10 mg via INTRAVENOUS
  Filled 2019-06-21: qty 4

## 2019-06-21 MED ORDER — METOPROLOL TARTRATE 25 MG PO TABS
25.0000 mg | ORAL_TABLET | Freq: Every day | ORAL | Status: DC
  Administered 2019-06-21 – 2019-06-22 (×2): 25 mg via ORAL
  Filled 2019-06-21 (×2): qty 1

## 2019-06-21 MED ORDER — FAMOTIDINE 20 MG PO TABS
20.0000 mg | ORAL_TABLET | Freq: Every day | ORAL | Status: DC
  Administered 2019-06-21 – 2019-06-22 (×2): 20 mg via ORAL
  Filled 2019-06-21 (×2): qty 1

## 2019-06-21 NOTE — Progress Notes (Signed)
[facility] Progress Note   Subjective:  Completed HD early this am with 1.3L UF. Continued abd pain, nausea this am.   Objective Vitals:   06/21/19 0430 06/21/19 0516 06/21/19 0622 06/21/19 1100  BP: (!) 148/92 (!) 166/111 (!) 156/102 (!) 147/92  Pulse: 98 (!) 107 97 93  Resp: 16 11 15  (!) 21  Temp: 98.7 F (37.1 C) 98.9 F (37.2 C) 98.9 F (37.2 C) 98.8 F (37.1 C)  TempSrc: Oral Oral Oral Oral  SpO2: 100% 99% 99% 98%  Weight: 77.9 kg     Height:        Weight change: 1.2 kg    Physical Exam General: Well appearing, nad  Heart: RRR Lungs: Clear, bilaterally  Abdomen: soft mild epigastric tenderness Extremities: No LE edema  Dialysis Access: LUE AVF +bruit   Additional Objective Labs: Basic Metabolic Panel: Recent Labs  Lab 06/15/19 1427 06/15/19 1501 06/17/19 0234 06/17/19 0234 06/18/19 2325 06/18/19 2325 06/19/19 0142 06/20/19 0531 06/21/19 0645  NA 140   < > 137   < > 138   < > 136 135 137  K 4.8   < > 5.8*   < > 4.6   < > 4.3 4.2 3.8  CL 98   < > 95*   < > 87*   < > 89* 91* 98  CO2 24   < > 25   < > 32  --   --  24 26  GLUCOSE 281*   < > 179*   < > 296*   < > 300* 140* 155*  BUN 59*   < > 52*   < > 31*   < > 30* 49* 22*  CREATININE 10.34*   < > 8.26*   < > 6.49*   < > 6.70* 9.49* 6.08*  CALCIUM 6.8*   < > 8.0*   < > 9.6  --   --  7.1* 7.7*  PHOS 7.0*  --  6.5*  --   --   --   --   --   --    < > = values in this interval not displayed.   CBC: Recent Labs  Lab 06/17/19 0234 06/17/19 0234 06/18/19 2325 06/19/19 0142 06/19/19 0835 06/20/19 0531 06/21/19 0645  WBC 9.9   < > 11.0*   < > 10.2 13.6* 7.8  HGB 10.8*   < > 12.7   < > 12.0 10.2* 11.0*  HCT 34.2*   < > 38.0   < > 37.0 32.7* 35.0*  MCV 101.2*  --  97.2  --  99.2 101.2* 100.3*  PLT 181   < > 191   < > 189 168 191   < > = values in this interval not displayed.   Blood Culture    Component Value Date/Time   SDES ABSCESS 10/28/2018 1335   SPECREQUEST NONE 10/28/2018  1335   CULT  10/28/2018 1335    FEW NORMAL SKIN FLORA NO ANAEROBES ISOLATED Performed at [facility], [place] [address], [place], [place] [address]    REPTSTATUS 11/02/2018 FINAL 10/28/2018 1335    Medications:  . famotidine  20 mg Oral Daily  . insulin aspart  0-6 Units Subcutaneous TID WC  . insulin glargine  20 Units Subcutaneous Daily  . metoCLOPramide (REGLAN) injection  10 mg Intravenous Q4H  . metoprolol tartrate  25 mg Oral Daily  . pantoprazole  40 mg Oral Daily    Dialysis Orders:  [facility]  MWF 350/800 EDW 80 kg 2K/3.5Ca UFP 4 AVF Heparin 4000+2000 midrun  Mircera 100 last 4/5  Assessment/Plan: 1. Abd pain/nausea/hematemisis. Hx gastroparesis. Per primary  2. ESRD -  HD MWF. Next HD 4/9 3. Hypertension/volume  - BP controlled. No volume on exam. Under EDW by weights here. Minimal UF on HD  4. Anemia  - Hgb 11. Recent ESA dose as outpatient. Follow trends.  5. Metabolic bone disease -  Ca low. On added Ca bath. Continue binders as tolerated . Not on VDRA.  6. Nutrition - Renal diet when eating.  7. DMT1 - insulin per primary  8. S/p transplant nephrectomy   Lynnda Child PA-C [facility] Pager [phone] 06/21/2019,11:17 AM  LOS: 1 day

## 2019-06-21 NOTE — Progress Notes (Signed)
PROGRESS NOTE    [name]  LPF:[id number] DOB: Feb 20, 1986 DOA: 06/19/2019 PCP: [name], MD     Brief Narrative:  [name] is a 34 y.o. female with medical history significant of ESRD on HD MWF, history of kidney transplant recipient, pyelonephritis, anemia of ESRD, type 1 diabetes, GERD, gastroparesis, hyperlipidemia, hypertension, pseudoseizures, seasonal allergies, history of UTI, triple X syndrome who was recently admitted from 06/16/2019 until 06/17/2019 due to exacerbation of gastroparesis now returning to the ED after finishing her HD she became nauseous and developed sharp abdominal pain.  She vomited at least 15 times yesterday evening with some bloody streaks.  She denies diarrhea or constipation (last BM was on Sunday).  CT abdomen/pelvis showed a lower esophageal thickening, likely esophagitis due to history of vomiting.  There was stable intra-abdominal imaging when compared to the 3 days ago.  There was no bowel obstruction stable postsurgical changes with tiny gas bubbles in the right lower quadrant side of prior renal transplant. She was re-admitted for gastroparesis.   New events last 24 hours / Subjective: Fentanyl was discontinued yesterday afternoon.  Reglan dosing increased.  Had one episode of vomiting yesterday evening.  Has been tolerating clear liquid diet overall.  Feeling better since admission.  Assessment & Plan:   Principal Problem:   Intractable abdominal pain Active Problems:   Anemia due to chronic kidney disease   Essential hypertension, benign   Type 1 diabetes mellitus with complication, uncontrolled (HCC)   Hematemesis   Dehydration   Gastroparesis   ESRD on hemodialysis (HCC)   Sinus tachycardia   Abdominal pain, nausea, vomiting -Hematemesis likely secondary to recurrent vomiting.  Nausea and vomiting has improved -Abdominal pain secondary to gastroparesis -Advance to full liquid diet today   Gastroparesis -Continue Reglan  ESRD  on HD MWF -Nephrology following  Anemia secondary to chronic kidney disease -Monitor CBC.  Hemoglobin remains stable  Essential hypertension -Continue metoprolol  Type 1 diabetes -Continue Lantus, sliding scale insulin   DVT prophylaxis: SCD  Code Status: Full code Family Communication: No family at bedside Disposition Plan:  . Patient is from home prior to admission. . Currently in-hospital treatment needed due to nausea and vomiting, advance to full liquid diet and monitor for nausea and vomiting and abdominal pain.  . Suspect patient will discharge home 4/9 if symptoms better.   Consultants:   Nephrology  Procedures:   None  Antimicrobials:  Anti-infectives (From admission, onward)   None       Objective: Vitals:   06/21/19 0430 06/21/19 0516 06/21/19 0622 06/21/19 1100  BP: (!) 148/92 (!) 166/111 (!) 156/102 (!) 147/92  Pulse: 98 (!) 107 97 93  Resp: 16 11 15  (!) 21  Temp: 98.7 F (37.1 C) 98.9 F (37.2 C) 98.9 F (37.2 C) 98.8 F (37.1 C)  TempSrc: Oral Oral Oral Oral  SpO2: 100% 99% 99% 98%  Weight: 77.9 kg     Height:        Intake/Output Summary (Last 24 hours) at 06/21/2019 1103 Last data filed at 06/21/2019 0900 Gross per 24 hour  Intake 290 ml  Output 1363 ml  Net -1073 ml   Filed Weights   06/19/19 2020 06/21/19 0130 06/21/19 0430  Weight: 78 kg 79.2 kg 77.9 kg    Examination: General exam: Appears calm and comfortable  Respiratory system: Clear to auscultation. Respiratory effort normal. Cardiovascular system: S1 & S2 heard, RRR. No pedal edema. Gastrointestinal system: Abdomen is nondistended, soft and nontender.  Normal bowel sounds heard. Central nervous system: Alert and oriented. Non focal exam. Speech clear  Extremities: Symmetric in appearance bilaterally  Skin: No rashes, lesions or ulcers on exposed skin  Psychiatry: Judgement and insight appear stable. Mood & affect appropriate.    Data Reviewed: I have personally reviewed  following labs and imaging studies  CBC: Recent Labs  Lab 06/17/19 0234 06/17/19 0234 06/18/19 2325 06/19/19 0142 06/19/19 0835 06/20/19 0531 06/21/19 0645  WBC 9.9  --  11.0*  --  10.2 13.6* 7.8  HGB 10.8*   < > 12.7 14.6 12.0 10.2* 11.0*  HCT 34.2*   < > 38.0 43.0 37.0 32.7* 35.0*  MCV 101.2*  --  97.2  --  99.2 101.2* 100.3*  PLT 181  --  191  --  189 168 191   < > = values in this interval not displayed.   Basic Metabolic Panel: Recent Labs  Lab 06/15/19 1427 06/15/19 1501 06/16/19 1439 06/16/19 1647 06/17/19 0234 06/18/19 2325 06/19/19 0142 06/20/19 0531 06/21/19 0645  NA 140   < > 137   < > 137 138 136 135 137  K 4.8   < > 5.3*   < > 5.8* 4.6 4.3 4.2 3.8  CL 98   < > 93*   < > 95* 87* 89* 91* 98  CO2 24   < > 26  --  25 32  --  24 26  GLUCOSE 281*   < > 387*   < > 179* 296* 300* 140* 155*  BUN 59*   < > 37*   < > 52* 31* 30* 49* 22*  CREATININE 10.34*   < > 7.42*   < > 8.26* 6.49* 6.70* 9.49* 6.08*  CALCIUM 6.8*   < > 9.5  --  8.0* 9.6  --  7.1* 7.7*  MG  --   --   --   --  2.2  --   --   --   --   PHOS 7.0*  --   --   --  6.5*  --   --   --   --    < > = values in this interval not displayed.   GFR: Estimated Creatinine Clearance: 13.6 mL/min (A) (by C-G formula based on SCr of 6.08 mg/dL (H)). Liver Function Tests: Recent Labs  Lab 06/15/19 0735 06/15/19 0735 06/15/19 1427 06/16/19 1439 06/17/19 0234 06/18/19 2325 06/20/19 0531  AST 55*  --   --  27 19 23  39  ALT 17  --   --  20 14 18  36  ALKPHOS 53  --   --  75 58 72 57  BILITOT 0.4  --   --  0.8 0.8 0.7 0.8  PROT 7.1  --   --  9.9* 8.1 10.1* 7.6  ALBUMIN 3.5   < > 3.2* 4.5 3.8 4.9 3.5   < > = values in this interval not displayed.   Recent Labs  Lab 06/15/19 0735 06/18/19 2325  LIPASE 36 37   No results for input(s): AMMONIA in the last 168 hours. Coagulation Profile: Recent Labs  Lab 06/19/19 0835  INR 1.1   Cardiac Enzymes: Recent Labs  Lab 06/16/19 2121  CKTOTAL 61   BNP  (last 3 results) No results for input(s): PROBNP in the last 8760 hours. HbA1C: No results for input(s): HGBA1C in the last 72 hours. CBG: Recent Labs  Lab 06/20/19 2683 06/20/19 1117 06/20/19 1756 06/21/19 0007 06/21/19 4196  GLUCAP 149* 185* 168* 103* 148*   Lipid Profile: No results for input(s): CHOL, HDL, LDLCALC, TRIG, CHOLHDL, LDLDIRECT in the last 72 hours. Thyroid Function Tests: No results for input(s): TSH, T4TOTAL, FREET4, T3FREE, THYROIDAB in the last 72 hours. Anemia Panel: No results for input(s): VITAMINB12, FOLATE, FERRITIN, TIBC, IRON, RETICCTPCT in the last 72 hours. Sepsis Labs: Recent Labs  Lab 06/16/19 2121  LATICACIDVEN 2.6*    Recent Results (from the past 240 hour(s))  Respiratory Panel by RT PCR (Flu A&B, Covid) - Nasopharyngeal Swab     Status: None   Collection Time: 06/15/19 11:40 AM   Specimen: Nasopharyngeal Swab  Result Value Ref Range Status   SARS Coronavirus 2 by RT PCR NEGATIVE NEGATIVE Final    Comment: (NOTE) SARS-CoV-2 target nucleic acids are NOT DETECTED. The SARS-CoV-2 RNA is generally detectable in upper respiratoy specimens during the acute phase of infection. The lowest concentration of SARS-CoV-2 viral copies this assay can detect is 131 copies/mL. A negative result does not preclude SARS-Cov-2 infection and should not be used as the sole basis for treatment or other patient management decisions. A negative result may occur with  improper specimen collection/handling, submission of specimen other than nasopharyngeal swab, presence of viral mutation(s) within the areas targeted by this assay, and inadequate number of viral copies (<131 copies/mL). A negative result must be combined with clinical observations, patient history, and epidemiological information. The expected result is Negative. Fact Sheet for Patients:  PinkCheek.be Fact Sheet for Healthcare Providers:    GravelBags.it This test is not yet ap proved or cleared by the [place] FDA and  has been authorized for detection and/or diagnosis of SARS-CoV-2 by FDA under an Emergency Use Authorization (EUA). This EUA will remain  in effect (meaning this test can be used) for the duration of the COVID-19 declaration under Section 564(b)(1) of the Act, 21 U.S.C. section 360bbb-3(b)(1), unless the authorization is terminated or revoked sooner.    Influenza A by PCR NEGATIVE NEGATIVE Final   Influenza B by PCR NEGATIVE NEGATIVE Final    Comment: (NOTE) The Xpert Xpress SARS-CoV-2/FLU/RSV assay is intended as an aid in  the diagnosis of influenza from Nasopharyngeal swab specimens and  should not be used as a sole basis for treatment. Nasal washings and  aspirates are unacceptable for Xpert Xpress SARS-CoV-2/FLU/RSV  testing. Fact Sheet for Patients: PinkCheek.be Fact Sheet for Healthcare Providers: GravelBags.it This test is not yet approved or cleared by the [place] FDA and  has been authorized for detection and/or diagnosis of SARS-CoV-2 by  FDA under an Emergency Use Authorization (EUA). This EUA will remain  in effect (meaning this test can be used) for the duration of the  Covid-19 declaration under Section 564(b)(1) of the Act, 21  U.S.C. section 360bbb-3(b)(1), unless the authorization is  terminated or revoked. Performed at [facility], [place] [address] [place], [place] [address]   SARS CORONAVIRUS 2 (TAT 6-24 HRS) Nasopharyngeal Nasopharyngeal Swab     Status: None   Collection Time: 06/19/19  6:45 AM   Specimen: Nasopharyngeal Swab  Result Value Ref Range Status   SARS Coronavirus 2 NEGATIVE NEGATIVE Final    Comment: (NOTE) SARS-CoV-2 target nucleic acids are NOT DETECTED. The SARS-CoV-2 RNA is generally detectable in upper and lower respiratory specimens during the acute phase  of infection. Negative results do not preclude SARS-CoV-2 infection, do not rule out co-infections with other pathogens, and should not be used as the sole basis for  treatment or other patient management decisions. Negative results must be combined with clinical observations, patient history, and epidemiological information. The expected result is Negative. Fact Sheet for Patients: SugarRoll.be Fact Sheet for Healthcare Providers: https://www.woods-mathews.com/ This test is not yet approved or cleared by the [place] FDA and  has been authorized for detection and/or diagnosis of SARS-CoV-2 by FDA under an Emergency Use Authorization (EUA). This EUA will remain  in effect (meaning this test can be used) for the duration of the COVID-19 declaration under Section 56 4(b)(1) of the Act, 21 U.S.C. section 360bbb-3(b)(1), unless the authorization is terminated or revoked sooner. Performed at [facility], [place] [address], [place], [place] [address]       Radiology Studies: No results found.    Scheduled Meds: . famotidine  20 mg Oral Daily  . insulin aspart  0-6 Units Subcutaneous TID WC  . insulin glargine  20 Units Subcutaneous Daily  . metoCLOPramide (REGLAN) injection  10 mg Intravenous Q4H  . metoprolol tartrate  25 mg Oral Daily  . pantoprazole  40 mg Oral Daily   Continuous Infusions:    LOS: 1 day      Time spent: 25 minutes   Dessa Phi, DO [facility] 06/21/2019, 11:03 AM   Available via Epic secure chat 7am-7pm After these hours, please refer to coverage provider listed on [email or web address]

## 2019-06-22 LAB — GLUCOSE, CAPILLARY: Glucose-Capillary: 77 mg/dL (ref 70–99)

## 2019-06-22 LAB — BASIC METABOLIC PANEL
Anion gap: 17 — ABNORMAL HIGH (ref 5–15)
BUN: 32 mg/dL — ABNORMAL HIGH (ref 6–20)
CO2: 25 mmol/L (ref 22–32)
Calcium: 7.3 mg/dL — ABNORMAL LOW (ref 8.9–10.3)
Chloride: 94 mmol/L — ABNORMAL LOW (ref 98–111)
Creatinine, Ser: 8.68 mg/dL — ABNORMAL HIGH (ref 0.44–1.00)
GFR calc Af Amer: 6 mL/min — ABNORMAL LOW (ref >60)
GFR calc non Af Amer: 5 mL/min — ABNORMAL LOW (ref >60)
Glucose, Bld: 91 mg/dL (ref 70–99)
Potassium: 3.6 mmol/L (ref 3.5–5.1)
Sodium: 136 mmol/L (ref 135–145)

## 2019-06-22 MED ORDER — METOCLOPRAMIDE HCL 10 MG PO TABS: 10.0000 mg | ORAL_TABLET | Freq: Three times a day (TID) | ORAL | 0 refills | Status: DC

## 2019-06-22 MED ORDER — ONDANSETRON 4 MG PO TBDP: 4.0000 mg | ORAL_TABLET | Freq: Three times a day (TID) | ORAL | 0 refills | Status: DC | PRN

## 2019-06-22 MED ORDER — DICYCLOMINE HCL 20 MG PO TABS: 20.0000 mg | ORAL_TABLET | Freq: Two times a day (BID) | ORAL | 0 refills | Status: DC | PRN

## 2019-06-22 NOTE — Progress Notes (Signed)
Patient can discharge today without inpt HD treatment per Dr. Jonnie Finner. She has been rescheduled at [facility] for tomorrow, Saturday 06/23/19 at 7:20am. She needs to arrive at 7:05am. Patient aware and agreeable. She states no need for assistance with transportation.  Alphonzo Cruise, [place] [phone]

## 2019-06-22 NOTE — Discharge Summary (Signed)
Physician Discharge Summary  [name] OEU:[id number] DOB: 08/19/1985 DOA: 06/19/2019  PCP: [name], MD  Admit date: 06/19/2019 Discharge date: 06/22/2019  Admitted From: Home Disposition:  Home  Recommendations for Outpatient Follow-up:  1. Follow up with PCP in 1 week 2. HD MWF as scheduled  Discharge Condition: Stable CODE STATUS: Full  Diet recommendation: Renal diet  Brief/Interim Summary: Valerie S Cookis a 34 y.o.femalewith medical history significant ofESRD on HD MWF, history of kidney transplant recipient, pyelonephritis, anemia of ESRD, type 1 diabetes, GERD, gastroparesis, hyperlipidemia, hypertension, pseudoseizures, seasonal allergies, history of UTI, triple X syndrome who was recently admitted from04/05/2019 until 06/17/2019 due to exacerbation of gastroparesis now returning to the ED after finishing her HD she became nauseous and developed sharp abdominal pain. She vomited at least 15 times yesterday evening with some bloodystreaks. She denies diarrhea or constipation (last BM was on Sunday). CT abdomen/pelvis showed a lower esophageal thickening, likely esophagitis due to history of vomiting. There was stable intra-abdominal imaging when compared to the 3 days ago. There was no bowel obstruction stable postsurgical changes with tiny gas bubbles in the right lower quadrant side of prior renal transplant.She was re-admitted for gastroparesis. She was treated with IV reglan and diet was slowly advanced from NPO to clear liquid diet, then full liquid and soft diet. Her nausea, vomiting, and abdominal pain slowly improved. On day of discharge, she was tolerating meals without vomiting and with tolerable abdominal soreness that had improved since admission.   Discharge Diagnoses:  Principal Problem:   Intractable abdominal pain Active Problems:   Anemia due to chronic kidney disease   Essential hypertension, benign   Type 1 diabetes mellitus with complication,  uncontrolled (HCC)   Hematemesis   Dehydration   Gastroparesis   ESRD on hemodialysis (HCC)   Sinus tachycardia   Abdominal pain, nausea, vomiting -Hematemesis likely secondary to recurrent vomiting.  Nausea and vomiting has improved -Abdominal pain secondary to gastroparesis, improved  -Advance to soft diet   Gastroparesis -Continue Reglan  ESRD on HD MWF -Nephrology following  Anemia secondary to chronic kidney disease -Monitor CBC.  Hemoglobin remains stable  Essential hypertension -Continue metoprolol  Type 1 diabetes -Continue Lantus, sliding scale insulin   Discharge Instructions  Discharge Instructions    Call MD for:  difficulty breathing, headache or visual disturbances   Complete by: As directed    Call MD for:  extreme fatigue   Complete by: As directed    Call MD for:  persistant dizziness or light-headedness   Complete by: As directed    Call MD for:  persistant nausea and vomiting   Complete by: As directed    Call MD for:  severe uncontrolled pain   Complete by: As directed    Call MD for:  temperature >100.4   Complete by: As directed    Discharge instructions   Complete by: As directed    You were cared for by a hospitalist during your hospital stay. If you have any questions about your discharge medications or the care you received while you were in the hospital after you are discharged, you can call the unit and ask to speak with the hospitalist on call if the hospitalist that took care of you is not available. Once you are discharged, your primary care physician will handle any further medical issues. Please note that NO REFILLS for any discharge medications will be authorized once you are discharged, as it is imperative that you return to your  primary care physician (or establish a relationship with a primary care physician if you do not have one) for your aftercare needs so that they can reassess your need for medications and monitor your lab  values.   Increase activity slowly   Complete by: As directed      Allergies as of 06/22/2019      Reactions   Benadryl [diphenhydramine-zinc Acetate] Shortness Of Breath   Diphenhydramine Hcl Anaphylaxis   REACTION: Stopped breathing in ICU   Doxycycline Shortness Of Breath   Motrin [ibuprofen] Shortness Of Breath, Itching   Per pt   Banana Other (See Comments)   Sick on the stomach   Iron Dextran Other (See Comments)   REACTION: vein irritation   Shellfish Allergy Hives   Chlorhexidine Itching      Medication List    TAKE these medications   Accu-Chek Softclix Lancets lancets Use to check blood sugar 4 times per day.   acetaminophen 500 MG tablet Commonly known as: TYLENOL Take 1,000 mg by mouth every 6 (six) hours as needed for moderate pain (pain).   b complex-vitamin c-folic acid 0.8 MG Tabs tablet Take 1 tablet by mouth every Monday, Wednesday, and Friday. After dialysis on dialysis days   calcium acetate 667 MG capsule Commonly known as: PHOSLO Take 1,334 mg by mouth 2 (two) times daily. Taking 1 capsule with a snack   calcium carbonate (dosed in mg elemental calcium) 1250 MG/5ML Susp Take 5 mLs by mouth 3 (three) times daily.   cetirizine 10 MG tablet Commonly known as: ZYRTEC Take 10 mg by mouth daily as needed for allergies.   D 1000 25 MCG (1000 UT) capsule Generic drug: Cholecalciferol Take 1,000 Units by mouth every other day.   diclofenac Sodium 1 % Gel Commonly known as: VOLTAREN Apply 2 g topically 3 (three) times daily as needed (pain).   dicyclomine 20 MG tablet Commonly known as: BENTYL Take 1 tablet (20 mg total) by mouth 2 (two) times daily as needed (stomach issues.).   fluticasone 50 MCG/ACT nasal spray Commonly known as: FLONASE Place 2 sprays into both nostrils daily as needed for allergies.   gabapentin 400 MG capsule Commonly known as: NEURONTIN Take 400 mg by mouth 3 (three) times daily.   hydrOXYzine 50 MG tablet Commonly  known as: ATARAX/VISTARIL Take 1 tablet (50 mg total) by mouth 2 (two) times daily as needed for itching.   insulin degludec 100 UNIT/ML FlexTouch Pen Commonly known as: TRESIBA Inject 0.6 mLs (60 Units total) into the skin 2 (two) times daily. Reports taking 24 units QAM What changed:   how much to take  when to take this  additional instructions   loperamide 2 MG capsule Commonly known as: IMODIUM Take 2 mg by mouth 4 (four) times daily as needed for diarrhea or loose stools.   metoCLOPramide 10 MG tablet Commonly known as: REGLAN Take 1 tablet (10 mg total) by mouth 3 (three) times daily before meals. What changed:   when to take this  reasons to take this   metoprolol tartrate 25 MG tablet Commonly known as: LOPRESSOR Take 1 tablet (25 mg total) by mouth 2 (two) times daily. What changed: when to take this   NovoLOG FlexPen 100 UNIT/ML FlexPen Generic drug: insulin aspart Inject 2-10 Units into the skin 3 (three) times daily with meals. as needed for blood sugar management (sliding scale)   ondansetron 4 MG disintegrating tablet Commonly known as: Zofran ODT Take 1 tablet (4  mg total) by mouth every 8 (eight) hours as needed for nausea or vomiting.   pantoprazole 40 MG tablet Commonly known as: PROTONIX Take 40 mg by mouth See admin instructions. Once to twice daily   Santyl ointment Generic drug: collagenase Apply 1 application topically daily.   simvastatin 20 MG tablet Commonly known as: ZOCOR Take 20 mg by mouth at bedtime.      Follow-up Information    [name], MD. Schedule an appointment as soon as possible for a visit in 1 week(s).   Specialty: Internal Medicine Contact information: [place] [address] [phone]          Allergies  Allergen Reactions  . Benadryl [Diphenhydramine-Zinc Acetate] Shortness Of Breath  . Diphenhydramine Hcl Anaphylaxis    REACTION: Stopped breathing in ICU  . Doxycycline  Shortness Of Breath  . Motrin [Ibuprofen] Shortness Of Breath and Itching    Per pt  . Banana Other (See Comments)    Sick on the stomach  . Iron Dextran Other (See Comments)    REACTION: vein irritation  . Shellfish Allergy Hives  . Chlorhexidine Itching    Consultations:  Nephrology    Procedures/Studies: CT ABDOMEN PELVIS W CONTRAST  Result Date: 06/19/2019 CLINICAL DATA:  Nausea and vomiting with abdominal pain EXAM: CT ABDOMEN AND PELVIS WITH CONTRAST TECHNIQUE: Multidetector CT imaging of the abdomen and pelvis was performed using the standard protocol following bolus administration of intravenous contrast. CONTRAST:  163mL OMNIPAQUE IOHEXOL 300 MG/ML  SOLN COMPARISON:  CT from 3 days ago FINDINGS: Lower chest: Lower esophageal low-density wall thickening which may be related to the history of vomiting. Hepatobiliary: No focal liver abnormality.Cholecystectomy. No bile duct dilatation. Pancreas: Unremarkable. Spleen: Unremarkable. Adrenals/Urinary Tract: Negative adrenals. 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 There is history of renal transplant to the right lower quadrant with architectural distortion and sheet like scarring in this area with tiny gas bubbles that are unchanged. No fluid collection or regional inflammatory changes. Negative collapsed bladder. Stomach/Bowel:  No obstruction. No appendicitis. Vascular/Lymphatic: No acute vascular abnormality. No mass or adenopathy. Reproductive:Hysterectomy. Other: No ascites or pneumoperitoneum. Musculoskeletal: No acute abnormalities. IMPRESSION: 1. Lower esophageal thickening, likely esophagitis in this patient with history of vomiting. 2. Stable intra-abdominal imaging compared to 3 days ago. No underlying bowel obstruction. 3. Stable postsurgical changes with tiny gas bubbles in the right lower quadrant, site of prior renal transplant. Electronically Signed   By: Monte Fantasia M.D.   On: 06/19/2019 05:09   CT ABDOMEN  PELVIS W CONTRAST  Result Date: 06/16/2019 CLINICAL DATA:  Bowel obstruction suspected, right lower quadrant abdominal pain, dialysis EXAM: CT ABDOMEN AND PELVIS WITH CONTRAST TECHNIQUE: Multidetector CT imaging of the abdomen and pelvis was performed using the standard protocol following bolus administration of intravenous contrast. CONTRAST:  144mL OMNIPAQUE IOHEXOL 300 MG/ML  SOLN COMPARISON:  10/27/2018 FINDINGS: Lower chest: No acute abnormality. Hepatobiliary: No focal liver abnormality is seen. Hepatic steatosis. Status post cholecystectomy. No biliary dilatation. Pancreas: Unremarkable. No pancreatic ductal dilatation or surrounding inflammatory changes. Spleen: Normal in size without significant abnormality. Adrenals/Urinary Tract: Adrenal glands are unremarkable. Status post right nephrectomy. Very atrophic left kidney. Bladder is unremarkable. Stomach/Bowel: Stomach is within normal limits. Appendix appears normal. No evidence of bowel wall thickening, distention, or inflammatory changes. Vascular/Lymphatic: No significant vascular findings are present. No enlarged abdominal or pelvic lymph nodes. Reproductive: Status post hysterectomy. Other: No abdominal wall hernia or abnormality. No abdominopelvic ascites. Near complete interval  resolution of a previously seen air and fluid collection in the right lower quadrant at the site of a previously explanted right lower quadrant renal allograft. There are tiny, persistent air locules, of uncertain etiology, possibly communicating from adjacent bowel. Musculoskeletal: No acute or significant osseous findings. IMPRESSION: 1.  No evidence of bowel obstruction. 2. 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 There are tiny, persistent air locules, of uncertain etiology, possibly communicating from adjacent bowel. 3.  Hepatic  steatosis. Electronically Signed   By: Eddie Candle M.D.   On: 06/16/2019 16:31   DG Chest Port 1 View  Result Date: 06/19/2019 CLINICAL DATA:  Vomiting for 3 days. EXAM: PORTABLE CHEST 1 VIEW COMPARISON:  Chest x-ray 06/15/2019 FINDINGS: The cardiac silhouette, mediastinal and hilar contours are within normal limits given the AP projection, portable technique and low lung volumes. Low lung volumes with vascular crowding and streaky bibasilar atelectasis. No infiltrates, edema or effusions. The bony thorax is intact. IMPRESSION: Low lung volumes with vascular crowding and streaky bibasilar atelectasis. Electronically Signed   By: Marijo Sanes M.D.   On: 06/19/2019 07:02   DG Chest Port 1 View  Result Date: 06/15/2019 CLINICAL DATA:  34 year old female with vomiting EXAM: PORTABLE CHEST 1 VIEW COMPARISON:  02/07/2019 FINDINGS: The cardiomediastinal silhouette unchanged in size and contour, borderline enlarged. Low lung volumes accentuating the interstitium. Linear opacity at the right lung base. No pneumothorax or large pleural effusion. No interlobular septal thickening. Interval removal of right IJ tunneled hemodialysis catheter. No displaced fracture IMPRESSION: Low lung volumes with atelectasis/scarring and no evidence of acute cardiopulmonary disease. Electronically Signed   By: Corrie Mckusick D.O.   On: 06/15/2019 08:04       Discharge Exam: Vitals:   06/22/19 0426 06/22/19 0840  BP: (!) 153/97 (!) 166/100  Pulse: 94 92  Resp: 16 14  Temp: 98.8 F (37.1 C) 98.9 F (37.2 C)  SpO2: 99% 99%    General: Pt is alert, awake, not in acute distress Cardiovascular: RRR, S1/S2 +, no edema Respiratory: CTA bilaterally, no wheezing, no rhonchi, no respiratory distress, no conversational dyspnea  Abdominal: Soft, NT, ND, bowel sounds + Extremities: no edema, no cyanosis Psych: Normal mood and affect, stable judgement and insight     The results of significant diagnostics from this  hospitalization (including imaging, microbiology, ancillary and laboratory) are listed below for reference.     Microbiology: Recent Results (from the past 240 hour(s))  Respiratory Panel by RT PCR (Flu A&B, Covid) - Nasopharyngeal Swab     Status: None   Collection Time: 06/15/19 11:40 AM   Specimen: Nasopharyngeal Swab  Result Value Ref Range Status   SARS Coronavirus 2 by RT PCR NEGATIVE NEGATIVE Final    Comment: (NOTE) SARS-CoV-2 target nucleic acids are NOT DETECTED. The SARS-CoV-2 RNA is generally detectable in upper respiratoy specimens during the acute phase of infection. The lowest concentration of SARS-CoV-2 viral copies this assay can detect is 131 copies/mL. A negative result does not preclude SARS-Cov-2 infection and should not be used as the sole basis for treatment or other patient management decisions. A negative result may occur with  improper specimen collection/handling, submission of specimen other than nasopharyngeal swab, presence of viral mutation(s) within the areas targeted by this assay, and inadequate number of viral copies (<131 copies/mL). A negative result must be combined with clinical  observations, patient history, and epidemiological information. The expected result is Negative. Fact Sheet for Patients:  PinkCheek.be Fact Sheet for Healthcare Providers:  GravelBags.it This test is not yet ap proved or cleared by the [place] FDA and  has been authorized for detection and/or diagnosis of SARS-CoV-2 by FDA under an Emergency Use Authorization (EUA). This EUA will remain  in effect (meaning this test can be used) for the duration of the COVID-19 declaration under Section 564(b)(1) of the Act, 21 U.S.C. section 360bbb-3(b)(1), unless the authorization is terminated or revoked sooner.    Influenza A by PCR NEGATIVE NEGATIVE Final   Influenza B by PCR NEGATIVE NEGATIVE Final    Comment:  (NOTE) The Xpert Xpress SARS-CoV-2/FLU/RSV assay is intended as an aid in  the diagnosis of influenza from Nasopharyngeal swab specimens and  should not be used as a sole basis for treatment. Nasal washings and  aspirates are unacceptable for Xpert Xpress SARS-CoV-2/FLU/RSV  testing. Fact Sheet for Patients: PinkCheek.be Fact Sheet for Healthcare Providers: GravelBags.it This test is not yet approved or cleared by the [place] FDA and  has been authorized for detection and/or diagnosis of SARS-CoV-2 by  FDA under an Emergency Use Authorization (EUA). This EUA will remain  in effect (meaning this test can be used) for the duration of the  Covid-19 declaration under Section 564(b)(1) of the Act, 21  U.S.C. section 360bbb-3(b)(1), unless the authorization is  terminated or revoked. Performed at [facility], [place] [address] [place], [place] [address]   SARS CORONAVIRUS 2 (TAT 6-24 HRS) Nasopharyngeal Nasopharyngeal Swab     Status: None   Collection Time: 06/19/19  6:45 AM   Specimen: Nasopharyngeal Swab  Result Value Ref Range Status   SARS Coronavirus 2 NEGATIVE NEGATIVE Final    Comment: (NOTE) SARS-CoV-2 target nucleic acids are NOT DETECTED. The SARS-CoV-2 RNA is generally detectable in upper and lower respiratory specimens during the acute phase of infection. Negative results do not preclude SARS-CoV-2 infection, do not rule out co-infections with other pathogens, and should not be used as the sole basis for treatment or other patient management decisions. Negative results must be combined with clinical observations, patient history, and epidemiological information. The expected result is Negative. Fact Sheet for Patients: SugarRoll.be Fact Sheet for Healthcare Providers: https://www.woods-mathews.com/ This test is not yet approved or cleared by the [place] FDA  and  has been authorized for detection and/or diagnosis of SARS-CoV-2 by FDA under an Emergency Use Authorization (EUA). This EUA will remain  in effect (meaning this test can be used) for the duration of the COVID-19 declaration under Section 56 4(b)(1) of the Act, 21 U.S.C. section 360bbb-3(b)(1), unless the authorization is terminated or revoked sooner. Performed at [facility], [place] [address], [place], [place] [address]      Labs: BNP (last 3 results) Recent Labs    09/01/18 1324  BNP 098.1*   Basic Metabolic Panel: Recent Labs  Lab 06/15/19 1427 06/15/19 1501 06/17/19 0234 06/17/19 0234 06/18/19 2325 06/19/19 0142 06/20/19 0531 06/21/19 0645 06/22/19 0249  NA 140   < > 137   < > 138 136 135 137 136  K 4.8   < > 5.8*   < > 4.6 4.3 4.2 3.8 3.6  CL 98   < > 95*   < > 87* 89* 91* 98 94*  CO2 24   < > 25  --  32  --  24 26 25   GLUCOSE 281*   < >  179*   < > 296* 300* 140* 155* 91  BUN 59*   < > 52*   < > 31* 30* 49* 22* 32*  CREATININE 10.34*   < > 8.26*   < > 6.49* 6.70* 9.49* 6.08* 8.68*  CALCIUM 6.8*   < > 8.0*  --  9.6  --  7.1* 7.7* 7.3*  MG  --   --  2.2  --   --   --   --   --   --   PHOS 7.0*  --  6.5*  --   --   --   --   --   --    < > = values in this interval not displayed.   Liver Function Tests: Recent Labs  Lab 06/15/19 1427 06/16/19 1439 06/17/19 0234 06/18/19 2325 06/20/19 0531  AST  --  27 19 23  39  ALT  --  20 14 18  36  ALKPHOS  --  75 58 72 57  BILITOT  --  0.8 0.8 0.7 0.8  PROT  --  9.9* 8.1 10.1* 7.6  ALBUMIN 3.2* 4.5 3.8 4.9 3.5   Recent Labs  Lab 06/18/19 2325  LIPASE 37   No results for input(s): AMMONIA in the last 168 hours. CBC: Recent Labs  Lab 06/17/19 0234 06/17/19 0234 06/18/19 2325 06/19/19 0142 06/19/19 0835 06/20/19 0531 06/21/19 0645  WBC 9.9  --  11.0*  --  10.2 13.6* 7.8  HGB 10.8*   < > 12.7 14.6 12.0 10.2* 11.0*  HCT 34.2*   < > 38.0 43.0 37.0 32.7* 35.0*  MCV 101.2*  --  97.2  --  99.2 101.2*  100.3*  PLT 181  --  191  --  189 168 191   < > = values in this interval not displayed.   Cardiac Enzymes: Recent Labs  Lab 06/16/19 2121  CKTOTAL 61   BNP: Invalid input(s): POCBNP CBG: Recent Labs  Lab 06/21/19 0523 06/21/19 1104 06/21/19 1612 06/21/19 2102 06/22/19 0607  GLUCAP 148* 142* 156* 138* 77   D-Dimer No results for input(s): DDIMER in the last 72 hours. Hgb A1c No results for input(s): HGBA1C in the last 72 hours. Lipid Profile No results for input(s): CHOL, HDL, LDLCALC, TRIG, CHOLHDL, LDLDIRECT in the last 72 hours. Thyroid function studies No results for input(s): TSH, T4TOTAL, T3FREE, THYROIDAB in the last 72 hours.  Invalid input(s): FREET3 Anemia work up No results for input(s): VITAMINB12, FOLATE, FERRITIN, TIBC, IRON, RETICCTPCT in the last 72 hours. Urinalysis    Component Value Date/Time   COLORURINE RED (A) 09/03/2018 1118   APPEARANCEUR CLOUDY (A) 09/03/2018 1118   LABSPEC 1.015 09/03/2018 1118   PHURINE 8.0 09/03/2018 1118   GLUCOSEU 250 (A) 09/03/2018 1118   HGBUR LARGE (A) 09/03/2018 1118   HGBUR negative 01/26/2010 1026   BILIRUBINUR NEGATIVE 09/03/2018 1118   BILIRUBINUR NEG 01/01/2011 1050   KETONESUR NEGATIVE 09/03/2018 1118   PROTEINUR >300 (A) 09/03/2018 1118   UROBILINOGEN 0.2 01/09/2015 1333   NITRITE POSITIVE (A) 09/03/2018 1118   LEUKOCYTESUR TRACE (A) 09/03/2018 1118   Sepsis Labs Invalid input(s): PROCALCITONIN,  WBC,  LACTICIDVEN Microbiology Recent Results (from the past 240 hour(s))  Respiratory Panel by RT PCR (Flu A&B, Covid) - Nasopharyngeal Swab     Status: None   Collection Time: 06/15/19 11:40 AM   Specimen: Nasopharyngeal Swab  Result Value Ref Range Status   SARS Coronavirus 2 by RT PCR NEGATIVE NEGATIVE Final  Comment: (NOTE) SARS-CoV-2 target nucleic acids are NOT DETECTED. The SARS-CoV-2 RNA is generally detectable in upper respiratoy specimens during the acute phase of infection. The  lowest concentration of SARS-CoV-2 viral copies this assay can detect is 131 copies/mL. A negative result does not preclude SARS-Cov-2 infection and should not be used as the sole basis for treatment or other patient management decisions. A negative result may occur with  improper specimen collection/handling, submission of specimen other than nasopharyngeal swab, presence of viral mutation(s) within the areas targeted by this assay, and inadequate number of viral copies (<131 copies/mL). A negative result must be combined with clinical observations, patient history, and epidemiological information. The expected result is Negative. Fact Sheet for Patients:  PinkCheek.be Fact Sheet for Healthcare Providers:  GravelBags.it This test is not yet ap proved or cleared by the [place] FDA and  has been authorized for detection and/or diagnosis of SARS-CoV-2 by FDA under an Emergency Use Authorization (EUA). This EUA will remain  in effect (meaning this test can be used) for the duration of the COVID-19 declaration under Section 564(b)(1) of the Act, 21 U.S.C. section 360bbb-3(b)(1), unless the authorization is terminated or revoked sooner.    Influenza A by PCR NEGATIVE NEGATIVE Final   Influenza B by PCR NEGATIVE NEGATIVE Final    Comment: (NOTE) The Xpert Xpress SARS-CoV-2/FLU/RSV assay is intended as an aid in  the diagnosis of influenza from Nasopharyngeal swab specimens and  should not be used as a sole basis for treatment. Nasal washings and  aspirates are unacceptable for Xpert Xpress SARS-CoV-2/FLU/RSV  testing. Fact Sheet for Patients: PinkCheek.be Fact Sheet for Healthcare Providers: GravelBags.it This test is not yet approved or cleared by the [place] FDA and  has been authorized for detection and/or diagnosis of SARS-CoV-2 by  FDA under an Emergency  Use Authorization (EUA). This EUA will remain  in effect (meaning this test can be used) for the duration of the  Covid-19 declaration under Section 564(b)(1) of the Act, 21  U.S.C. section 360bbb-3(b)(1), unless the authorization is  terminated or revoked. Performed at [facility], [place] [address], [place], [place] [address]   SARS CORONAVIRUS 2 (TAT 6-24 HRS) Nasopharyngeal Nasopharyngeal Swab     Status: None   Collection Time: 06/19/19  6:45 AM   Specimen: Nasopharyngeal Swab  Result Value Ref Range Status   SARS Coronavirus 2 NEGATIVE NEGATIVE Final    Comment: (NOTE) SARS-CoV-2 target nucleic acids are NOT DETECTED. The SARS-CoV-2 RNA is generally detectable in upper and lower respiratory specimens during the acute phase of infection. Negative results do not preclude SARS-CoV-2 infection, do not rule out co-infections with other pathogens, and should not be used as the sole basis for treatment or other patient management decisions. Negative results must be combined with clinical observations, patient history, and epidemiological information. The expected result is Negative. Fact Sheet for Patients: SugarRoll.be Fact Sheet for Healthcare Providers: https://www.woods-mathews.com/ This test is not yet approved or cleared by the [place] FDA and  has been authorized for detection and/or diagnosis of SARS-CoV-2 by FDA under an Emergency Use Authorization (EUA). This EUA will remain  in effect (meaning this test can be used) for the duration of the COVID-19 declaration under Section 56 4(b)(1) of the Act, 21 U.S.C. section 360bbb-3(b)(1), unless the authorization is terminated or revoked sooner. Performed at [facility], [place] [address], [place], [place] [address]      Patient was seen and examined on the  day of discharge and was found to be in stable condition. Time coordinating discharge: 35 minutes including  assessment and coordination of care, as well as examination of the patient.   SIGNED:  Dessa Phi, DO [facility] 06/22/2019, 8:43 AM

## 2019-06-22 NOTE — Progress Notes (Signed)
Discharged to home with family office visits in place teaching done  

## 2019-06-22 NOTE — Progress Notes (Signed)
Subjective:  No cos, for dc today  And arranged HD AT [place] and then continues OP HD MWF  0n Monday/   Objective Vital signs in last 24 hours: Vitals:   06/21/19 1704 06/21/19 2004 06/22/19 0426 06/22/19 0840  BP: (!) 173/99 (!) 166/98 (!) 153/97 (!) 166/100  Pulse:  90 94 92  Resp: 19 15 16 14   Temp:  98.8 F (37.1 C) 98.8 F (37.1 C) 98.9 F (37.2 C)  TempSrc:  Oral Oral Oral  SpO2:  98% 99% 99%  Weight:      Height:       Weight change:   Physical Exam General: Well appearing, nad  Heart: RRR Lungs: Clear, bilaterally  Abdomen: soft non tender Extremities: No LE edema  Dialysis Access: LUE AVF +bruit  Dialysis Orders:  [place] MWF 350/800 EDW 80 kg 2K/3.5Ca UFP 4 AVF Heparin 4000+2000 midrun  Mircera 100 last4/5  Problem/Plan 1. Abdpain/nausea/hematemisis. Hx gastroparesis. Per primary / ok for dc  2. ESRD -HD MWF. Next HD 4/10 , k and vol ok  Today , emergent cases in [place] and no op spot till am  dw Pt . 3. Hypertension/volume - BP controlled. No volume on exam. Under EDWby weights here. Minimal UFon HD  4. Anemia - Hgb 11. Recent ESAdose as outpatient. Follow trends.  5. Metabolic bone disease -Ca low. On added Ca bath. Continue binders as tolerated . Not on VDRA. 6. Nutrition -Renal diet when eating.  7. DMT1- insulin per primary  8. S/p transplant nephrectomy   Ernest Haber, PA-C [facility] [phone] 06/22/2019,3:51 PM  LOS: 2 days   Labs: Basic Metabolic Panel: Recent Labs  Lab 06/17/19 0234 06/18/19 2325 06/20/19 0531 06/21/19 0645 06/22/19 0249  NA 137   < > 135 137 136  K 5.8*   < > 4.2 3.8 3.6  CL 95*   < > 91* 98 94*  CO2 25   < > 24 26 25   GLUCOSE 179*   < > 140* 155* 91  BUN 52*   < > 49* 22* 32*  CREATININE 8.26*   < > 9.49* 6.08* 8.68*  CALCIUM 8.0*   < > 7.1* 7.7* 7.3*  PHOS 6.5*  --   --   --   --    < > = values in this interval not displayed.   Liver Function Tests: Recent Labs  Lab  06/17/19 0234 06/18/19 2325 06/20/19 0531  AST 19 23 39  ALT 14 18 36  ALKPHOS 58 72 57  BILITOT 0.8 0.7 0.8  PROT 8.1 10.1* 7.6  ALBUMIN 3.8 4.9 3.5   Recent Labs  Lab 06/18/19 2325  LIPASE 37   No results for input(s): AMMONIA in the last 168 hours. CBC: Recent Labs  Lab 06/17/19 0234 06/17/19 0234 06/18/19 2325 06/19/19 0142 06/19/19 0835 06/20/19 0531 06/21/19 0645  WBC 9.9   < > 11.0*   < > 10.2 13.6* 7.8  HGB 10.8*   < > 12.7   < > 12.0 10.2* 11.0*  HCT 34.2*   < > 38.0   < > 37.0 32.7* 35.0*  MCV 101.2*  --  97.2  --  99.2 101.2* 100.3*  PLT 181   < > 191   < > 189 168 191   < > = values in this interval not displayed.   Cardiac Enzymes: Recent Labs  Lab 06/16/19 2121  CKTOTAL 61   CBG: Recent Labs  Lab 06/21/19  3912 06/21/19 1104 06/21/19 1612 06/21/19 2102 06/22/19 0607  GLUCAP 148* 142* 156* 138* 77    Studies/Results: No results found. Medications:  . famotidine  20 mg Oral Daily  . insulin aspart  0-6 Units Subcutaneous TID WC  . insulin glargine  20 Units Subcutaneous Daily  . metoCLOPramide (REGLAN) injection  10 mg Intravenous Q4H  . metoprolol tartrate  25 mg Oral Daily  . pantoprazole  40 mg Oral Daily

## 2019-06-22 NOTE — Discharge Instructions (Signed)
Gastroparesis  Gastroparesis is a condition in which food takes longer than normal to empty from the stomach. The condition is usually long-lasting (chronic). It may also be called delayed gastric emptying. There is no cure, but there are treatments and things that you can do at home to help relieve symptoms. Treating the underlying condition that causes gastroparesis can also help relieve symptoms. What are the causes? In many cases, the cause of this condition is not known. Possible causes include:  A hormone (endocrine) disorder, such as hypothyroidism or diabetes.  A nervous system disease, such as Parkinson's disease or multiple sclerosis.  Cancer, infection, or surgery that affects the stomach or vagus nerve. The vagus nerve runs from your chest, through your neck, to the lower part of your brain.  A connective tissue disorder, such as scleroderma.  Certain medicines. What increases the risk? You are more likely to develop this condition if you:  Have certain disorders or diseases, including: ? An endocrine disorder. ? An eating disorder. ? Amyloidosis. ? Scleroderma. ? Parkinson's disease. ? Multiple sclerosis. ? Cancer or infection of the stomach or the vagus nerve.  Have had surgery on the stomach or vagus nerve.  Take certain medicines.  Are female. What are the signs or symptoms? Symptoms of this condition include:  Feeling full after eating very little.  Nausea.  Vomiting.  Heartburn.  Abdominal bloating.  Inconsistent blood sugar (glucose) levels on blood tests.  Lack of appetite.  Weight loss.  Acid from the stomach coming up into the esophagus (gastroesophageal reflux).  Sudden tightening (spasm) of the stomach, which can be painful. Symptoms may come and go. Some people may not notice any symptoms. How is this diagnosed? This condition is diagnosed with tests, such as:  Tests that check how long it takes food to move through the stomach and  intestines. These tests include: ? Upper gastrointestinal (GI) series. For this test, you drink a liquid that shows up well on X-rays, and then X-rays will be taken of your intestines. ? Gastric emptying scintigraphy. For this test, you eat food that contains a small amount of radioactive material, and then scans are taken. ? Wireless capsule GI monitoring system. For this test, you swallow a pill (capsule) that records information about how foods and fluid move through your stomach.  Gastric manometry. For this test, a tube is passed down your throat and into your stomach to measure electrical and muscular activity.  Endoscopy. For this test, a long, thin tube is passed down your throat and into your stomach to check for problems in your stomach lining.  Ultrasound. This test uses sound waves to create images of inside the body. This can help rule out gallbladder disease or pancreatitis as a cause of your symptoms. How is this treated? There is no cure for gastroparesis. Treatment may include:  Treating the underlying cause.  Managing your symptoms by making changes to your diet and exercise habits.  Taking medicines to control nausea and vomiting and to stimulate stomach muscles.  Getting food through a feeding tube in the hospital. This may be done in severe cases.  Having surgery to insert a device into your body that helps improve stomach emptying and control nausea and vomiting (gastric neurostimulator). Follow these instructions at home:  Take over-the-counter and prescription medicines only as told by your health care provider.  Follow instructions from your health care provider about eating or drinking restrictions. Your health care provider may recommend that you: ? Eat   smaller meals more often. ? Eat low-fat foods. ? Eat low-fiber forms of high-fiber foods. For example, eat cooked vegetables instead of raw vegetables. ? Have only liquid foods instead of solid foods. Liquid  foods are easier to digest.  Drink enough fluid to keep your urine pale yellow.  Exercise as often as told by your health care provider.  Keep all follow-up visits as told by your health care provider. This is important. Contact a health care provider if you:  Notice that your symptoms do not improve with treatment.  Have new symptoms. Get help right away if you:  Have severe abdominal pain that does not improve with treatment.  Have nausea that is severe or does not go away.  Cannot drink fluids without vomiting. Summary  Gastroparesis is a chronic condition in which food takes longer than normal to empty from the stomach.  Symptoms include nausea, vomiting, heartburn, abdominal bloating, and loss of appetite.  Eating smaller portions, and low-fat, low-fiber foods may help you manage your symptoms.  Get help right away if you have severe abdominal pain. This information is not intended to replace advice given to you by your health care provider. Make sure you discuss any questions you have with your health care provider. Document Revised: 05/30/2017 Document Reviewed: 01/04/2017 Elsevier Patient Education  2020 Elsevier Inc.  

## 2019-07-04 ENCOUNTER — Ambulatory Visit: Payer: Medicare Other | Attending: Internal Medicine

## 2019-07-04 DIAGNOSIS — Z23 Encounter for immunization: Secondary | ICD-10-CM

## 2019-07-04 NOTE — Progress Notes (Signed)
   Covid-19 Vaccination Clinic  Name:  [name]    MRN: [id number] DOB: [birth year]  07/04/2019  [name] was observed post Covid-19 immunization for 30 minutes based on pre-vaccination screening without incident. She was provided with Vaccine Information Sheet and instruction to access the V-Safe system.   [name] was instructed to call 911 with any severe reactions post vaccine: Marland Kitchen Difficulty breathing  . Swelling of face and throat  . A fast heartbeat  . A bad rash all over body  . Dizziness and weakness   Immunizations Administered    Name Date Dose VIS Date Route   Pfizer COVID-19 Vaccine 07/04/2019  3:07 PM 0.3 mL 05/09/2018 Intramuscular   Manufacturer: [place]   Lot: [id number]   [place]: [id number]

## 2019-07-25 ENCOUNTER — Encounter: Payer: Self-pay | Admitting: Physician Assistant

## 2019-07-25 ENCOUNTER — Ambulatory Visit (INDEPENDENT_AMBULATORY_CARE_PROVIDER_SITE_OTHER): Payer: Medicare Other | Admitting: Physician Assistant

## 2019-07-25 VITALS — BP 86/60 | Temp 97.2°F | Ht 66.0 in | Wt 175.0 lb

## 2019-07-25 DIAGNOSIS — K3184 Gastroparesis: Secondary | ICD-10-CM | POA: Diagnosis not present

## 2019-07-25 NOTE — Patient Instructions (Signed)
If you are age 36 or older, your body mass index should be between 23-30. Your Body mass index is 28.25 kg/m. If this is out of the aforementioned range listed, please consider follow up with your Primary Care Provider.  If you are age 36 or younger, your body mass index should be between 19-25. Your Body mass index is 28.25 kg/m. If this is out of the aformentioned range listed, please consider follow up with your Primary Care Provider.   Follow up as needed.

## 2019-07-25 NOTE — Progress Notes (Signed)
Chief Complaint: Gastroparesis  HPI:    [name] is a 34 year old African-American female, known to Dr. Carlean Purl, with a past medical history as listed below including ESRD on dialysis, history of kidney transplant, type 1 diabetes, who was referred to me by [name], MD for a complaint of gastroparesis.      03/09/2017 office visit with Dr. Carlean Purl for non-intractable cyclical vomiting with nausea.    07/04/2017 EGD by Dr. Watt Climes with a small hiatal hernia and moderate distal to mid reflux esophagitis and a large amount of food residue in the stomach.      06/19/2019-06/22/2019 patient admitted to the hospital.  At that time was noted she had recently been admitted 06/16/2019-06/17/2019 due to exacerbation of gastroparesis and return to the ED after finishing her HD because she became nauseous and developed sharp abdominal pain with vomiting.  CT abdomen pelvis showed a lower esophageal thickening, likely esophagitis due to history of vomiting.  There was stable intra-abdominal imaging when compared to 3 days ago.  She was readmitted for gastroparesis and treated with IV Reglan diet was slowly advanced from n.p.o. to clears, then full and soft.  Her nausea vomiting abdominal pain slowly improved.    Today, the patient presents to clinic and tells me that she is doing well.  (Of note patient is falling asleep during time of my exam and questioning, she tells me she just got done from dialysis and sometimes feels this way).  Currently using her Reglan 10 mg 2-3 times a day as well as Pantoprazole 40 mg once a day and Zofran occasionally.  Tells me at the moment she is having no problems.  She has no nausea or vomiting and her abdominal pain is mostly under control.  She is aware to eat small meals.    Denies fever, chills, weight loss, abdominal pain, nausea, vomiting, heartburn, reflux or symptoms that awaken her from sleep.  Past Medical History:  Diagnosis Date  . Anemia   . Chronic kidney disease    kidney transplant 07  . Diabetes mellitus    Pt reports diagnosis in June 2011, Type 2  . GERD (gastroesophageal reflux disease)   . Hyperlipidemia   . Hypertension   . 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 Pyelonephritis 06/23/2014  . Seasonal allergies   . UTI (urinary tract infection) 01/09/2015  . 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 ARTERIOVENOUS GRAFT PLACEMENT Bilateral    "neither work" (10/24/2017)  . AV FISTULA PLACEMENT Left 10/26/2018   Procedure: CREATION OF ARTERIOVENOUS FISTULA  LEFT ARM;  Surgeon: [name], MD;  Location: [facility];  Service: Vascular;  Laterality: Left;  . BASCILIC VEIN TRANSPOSITION Left 12/21/2018   Procedure: Left arm BASILIC VEIN TRANSPOSITION SECOND STAGE;  Surgeon: [name], MD;  Location: [facility];  Service: Vascular;  Laterality: Left;  . CHOLECYSTECTOMY N/A 06/30/2017   Procedure: LAPAROSCOPIC CHOLECYSTECTOMY WITH INTRAOPERATIVE CHOLANGIOGRAM;  Surgeon: Excell Seltzer, MD;  Location: [facility];  Service: General;  Laterality: N/A;  . ESOPHAGOGASTRODUODENOSCOPY (EGD) WITH PROPOFOL N/A 07/04/2017   Procedure: ESOPHAGOGASTRODUODENOSCOPY (EGD) WITH PROPOFOL;  Surgeon: Clarene Essex, MD;  Location: [facility];  Service: Endoscopy;  Laterality: N/A;  . INSERTION OF DIALYSIS CATHETER N/A  03/20/2018   Procedure: INSERTION OF TUNNELED DIALYSIS CATHETER - RIGHT INTERANL JUGULAR PLACEMENT;  Surgeon: Angelia Mould, MD;  Location: [facility];  Service: Vascular;  Laterality: N/A;  . IR GUIDED [place]  10/28/2018  . KIDNEY TRANSPLANT  2007  . PARATHYROIDECTOMY  ?2012   "3/4 removed" (10/24/2017)  . RENAL BIOPSY Bilateral 2003  . UPPER EXTREMITY VENOGRAPHY Bilateral 10/19/2018   Procedure: UPPER EXTREMITY  VENOGRAPHY;  Surgeon: [name], MD;  Location: [facility];  Service: Cardiovascular;  Laterality: Bilateral;  Bilateral     Current Outpatient Medications  Medication Sig Dispense Refill  . ACCU-CHEK SOFTCLIX LANCETS lancets Use to check blood sugar 4 times per day. 150 each 5  . acetaminophen (TYLENOL) 500 MG tablet Take 1,000 mg by mouth every 6 (six) hours as needed for moderate pain (pain).     Marland Kitchen b complex-vitamin c-folic acid (NEPHRO-VITE) 0.8 MG TABS tablet Take 1 tablet by mouth every Monday, Wednesday, and Friday. After dialysis on dialysis days    . calcium acetate (PHOSLO) 667 MG capsule Take 1,334 mg by mouth 2 (two) times daily. Taking 1 capsule with a snack    . Calcium Carbonate Antacid (CALCIUM CARBONATE, DOSED IN MG ELEMENTAL CALCIUM,) 1250 MG/5ML SUSP Take 5 mLs by mouth 3 (three) times daily.     . cetirizine (ZYRTEC) 10 MG tablet Take 10 mg by mouth daily as needed for allergies.    . D 1000 25 MCG (1000 UT) capsule Take 1,000 Units by mouth every other day.     . diclofenac Sodium (VOLTAREN) 1 % GEL Apply 2 g topically 3 (three) times daily as needed (pain).     Marland Kitchen dicyclomine (BENTYL) 20 MG tablet Take 1 tablet (20 mg total) by mouth 2 (two) times daily as needed (stomach issues.). 60 tablet 0  . fluticasone (FLONASE) 50 MCG/ACT nasal spray Place 2 sprays into both nostrils daily as needed for allergies.    Marland Kitchen gabapentin (NEURONTIN) 400 MG capsule Take 400 mg by mouth 3 (three) times daily.    . hydrOXYzine (ATARAX/VISTARIL) 50 MG tablet Take 1 tablet (50 mg total) by mouth 2 (two) times daily as needed for itching. 30 tablet 0  . insulin aspart (NOVOLOG FLEXPEN) 100 UNIT/ML FlexPen Inject 2-10 Units into the skin 3 (three) times daily with meals. as needed for blood sugar management (sliding scale)    . insulin degludec (TRESIBA) 100 UNIT/ML SOPN FlexTouch Pen Inject 0.6 mLs (60 Units total) into the skin 2 (two) times daily. Reports taking 24 units QAM  (Patient taking differently: Inject 20 Units into the skin daily. ) 4 pen 3  . loperamide (IMODIUM) 2 MG capsule Take 2 mg by mouth 4 (four) times daily as needed for diarrhea or loose stools.     . Methoxy PEG-Epoetin Beta (MIRCERA IJ) Mircera    . metoCLOPramide (REGLAN) 10 MG tablet Take 1 tablet (10 mg total) by mouth 3 (three) times daily before meals. 90 tablet 0  . ondansetron (ZOFRAN ODT) 4 MG disintegrating tablet Take 1 tablet (4 mg total) by mouth every 8 (eight) hours as needed for nausea or vomiting. 20 tablet 0  . pantoprazole (PROTONIX) 40 MG tablet Take 40 mg by mouth See admin instructions. Once to twice daily    . simvastatin (ZOCOR) 20 MG tablet Take 20 mg by mouth at bedtime.     . metoprolol tartrate (LOPRESSOR) 25 MG tablet Take 1 tablet (25 mg total) by  mouth 2 (two) times daily. (Patient taking differently: Take 25 mg by mouth daily. ) 180 tablet 0   No current facility-administered medications for this visit.    Allergies as of 07/25/2019 - Review Complete 07/25/2019  Allergen Reaction Noted  . Benadryl [diphenhydramine-zinc acetate] Shortness Of Breath 01/22/2016  . Diphenhydramine hcl Anaphylaxis 02/24/2010  . Doxycycline Shortness Of Breath 11/20/2015  . Motrin [ibuprofen] Shortness Of Breath and Itching 09/17/2010  . Banana Other (See Comments) 12/22/2012  . Iron dextran Other (See Comments) 02/24/2010  . Shellfish allergy Hives 12/22/2012  . Chlorhexidine Itching 06/15/2019    Family History  Problem Relation Age of Onset  . Arthritis Mother   . Hypertension Mother   . Aneurysm Mother        died of brain aneurysm  . CAD Father        Has 3 stents  . Diabetes Father        borderline  . Early death Brother        Died in war    Social History   Socioeconomic History  . Marital status: Single    Spouse name: Not on file  . Number of children: Not on file  . Years of education: Not on file  . Highest education level: Not on file  Occupational  History  . 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 Smoking status: Never Smoker  . Smokeless tobacco: Never Used  Substance and Sexual Activity  . Alcohol use: No  . Drug use: No  . Sexual activity: Yes    Birth control/protection: None  Other Topics Concern  . Not on file  Social History Narrative   Patient reports that she is single, she is employed at the [employer]   No alcohol tobacco or drug use   Social Determinants of Radio broadcast assistant Strain:   . Difficulty of Paying Living Expenses:   Food Insecurity:   . Worried About Charity fundraiser in the Last Year:   . Arboriculturist in the Last Year:   Transportation Needs:   . Film/video editor (Medical):   Marland Kitchen Lack of Transportation (Non-Medical):   Physical Activity:   . Days of Exercise per Week:   . Minutes of Exercise per Session:   Stress:   . Feeling of Stress :   Social Connections:   . Frequency of Communication with Friends and Family:   . Frequency of Social Gatherings with Friends and Family:   . Attends Religious Services:   . Active Member of Clubs or Organizations:   . Attends Archivist Meetings:   Marland Kitchen Marital Status:   Intimate Partner Violence:   . Fear of Current or Ex-Partner:   . Emotionally Abused:   Marland Kitchen Physically Abused:   . Sexually Abused:     Review of Systems:    Constitutional: No weight loss, fever or chills Cardiovascular: No chest pain   Respiratory: No SOB Gastrointestinal: See HPI and otherwise negative   Physical Exam:  Vital signs: BP (!) 86/60   Temp (!) 97.2 F (36.2 C)   Ht 5\' 6"  (1.676 m)   Wt 175 lb (79.4 kg)   BMI 28.25 kg/m   Constitutional:   Pleasant AA female appears to be in NAD, Well developed, Well nourished, alert and cooperative Respiratory: Respirations even and unlabored. Lungs clear to auscultation bilaterally.   No wheezes, crackles,  or rhonchi.  Cardiovascular: Normal S1, S2.  No MRG. Regular rate and rhythm. No peripheral edema, cyanosis or pallor.  Gastrointestinal:  Soft, nondistended, nontender. No rebound or guarding. Normal bowel sounds. No appreciable masses or hepatomegaly. Rectal:  Not performed.  Psychiatric: Demonstrates good judgement and reason. lethargic  RELEVANT LABS AND IMAGING: CBC    Component Value Date/Time   WBC 7.8 06/21/2019 0645   RBC 3.49 (L) 06/21/2019 0645   HGB 11.0 (L) 06/21/2019 0645   HCT 35.0 (L) 06/21/2019 0645   PLT 191 06/21/2019 0645   PLT 206 07/17/2009 0000   MCV 100.3 (H) 06/21/2019 0645   MCH 31.5 06/21/2019 0645   MCHC 31.4 06/21/2019 0645   RDW 17.3 (H) 06/21/2019 0645   LYMPHSABS 2.6 10/31/2018 0619   MONOABS 0.7 10/31/2018 0619   EOSABS 0.2 10/31/2018 0619   BASOSABS 0.1 10/31/2018 0619    CMP     Component Value Date/Time   NA 136 06/22/2019 0249   K 3.6 06/22/2019 0249   CL 94 (L) 06/22/2019 0249   CO2 25 06/22/2019 0249   GLUCOSE 91 06/22/2019 0249   BUN 32 (H) 06/22/2019 0249   CREATININE 8.68 (H) 06/22/2019 0249   CALCIUM 7.3 (L) 06/22/2019 0249   CALCIUM 5.7 (LL) 03/20/2018 1915   PROT 7.6 06/20/2019 0531   ALBUMIN 3.5 06/20/2019 0531   AST 39 06/20/2019 0531   ALT 36 06/20/2019 0531   ALKPHOS 57 06/20/2019 0531   BILITOT 0.8 06/20/2019 0531   GFRNONAA 5 (L) 06/22/2019 0249   GFRAA 6 (L) 06/22/2019 0249    Assessment: 1.  Gastroparesis: Under control currently with Reglan 3 times daily and occasional Zofran  Plan: 1.  Reviewed the diagnosis of gastroparesis with the patient.  Recommend smaller more frequent meals.  Discussed low fiber, especially low raw fiber in detail. 2.  Continue current medications including Reglan and Zofran with Pantoprazole as prescribed. 3.  Patient will contact our clinic if she has any problems in the future.  Ellouise Newer, PA-C [facility] 07/25/2019, 4:10 PM  Cc: [name], MD

## 2019-07-26 ENCOUNTER — Other Ambulatory Visit: Payer: Self-pay | Admitting: Podiatry

## 2019-07-26 ENCOUNTER — Ambulatory Visit (INDEPENDENT_AMBULATORY_CARE_PROVIDER_SITE_OTHER): Payer: Medicare Other | Admitting: Podiatry

## 2019-07-26 ENCOUNTER — Encounter: Payer: Self-pay | Admitting: Podiatry

## 2019-07-26 ENCOUNTER — Ambulatory Visit (INDEPENDENT_AMBULATORY_CARE_PROVIDER_SITE_OTHER): Payer: Medicare Other

## 2019-07-26 ENCOUNTER — Other Ambulatory Visit: Payer: Self-pay

## 2019-07-26 DIAGNOSIS — M2041 Other hammer toe(s) (acquired), right foot: Secondary | ICD-10-CM

## 2019-07-26 DIAGNOSIS — IMO0002 Reserved for concepts with insufficient information to code with codable children: Secondary | ICD-10-CM

## 2019-07-26 DIAGNOSIS — M21619 Bunion of unspecified foot: Secondary | ICD-10-CM | POA: Diagnosis not present

## 2019-07-26 DIAGNOSIS — M2042 Other hammer toe(s) (acquired), left foot: Secondary | ICD-10-CM

## 2019-07-26 DIAGNOSIS — E1065 Type 1 diabetes mellitus with hyperglycemia: Secondary | ICD-10-CM

## 2019-07-26 DIAGNOSIS — E1140 Type 2 diabetes mellitus with diabetic neuropathy, unspecified: Secondary | ICD-10-CM | POA: Diagnosis not present

## 2019-07-26 DIAGNOSIS — M79675 Pain in left toe(s): Secondary | ICD-10-CM

## 2019-07-26 DIAGNOSIS — M79674 Pain in right toe(s): Secondary | ICD-10-CM

## 2019-07-26 DIAGNOSIS — B351 Tinea unguium: Secondary | ICD-10-CM | POA: Diagnosis not present

## 2019-07-26 DIAGNOSIS — L84 Corns and callosities: Secondary | ICD-10-CM

## 2019-07-26 DIAGNOSIS — E1149 Type 2 diabetes mellitus with other diabetic neurological complication: Secondary | ICD-10-CM

## 2019-07-27 NOTE — Progress Notes (Signed)
Subjective:   Patient ID: [name], female   DOB: 34 y.o.   MRN: [id number]   HPI Patient presents stating that she is having a lot of problems with her feet and has sugar that she is trying to get under control but continues to run high and states she has a painful bunion left she has corns on the big toes of both feet and she has thick yellow brittle toenails bilateral that make it hard for her to wear shoe gear difficult without difficulty.   ROS      Objective:  Physical Exam  Vascular status moderately diminished but intact with neurological diminished both sharp dull and vibratory.  Patient is found to have thick dystrophic nailbeds 1-5 both feet that are moderately bothersome has keratotic lesions on the hallux of both feet and they are bothersome with moderate to severe structural bunion deformity left which gets irritated on the side with pressure against the second toe.  She also is noted to have dry skin     Assessment:  Poor health individual who is on dialysis who has significant structural foot deformity left significant nail disease with thickness and discomfort and keratotic tissue formation     Plan:  H NP reviewed diabetic education and discussed the importance of daily inspections.  Today I debrided lesions bilateral debrided nailbeds 1-5 both feet and also I discussed diabetic shoes to try to accommodate her feet better as I do not think at this point she is a good candidate for surgical intervention.  Patient was made aware of this and the difficulty of this condition and the importance of daily good foot care which can be done on a routine basis  X-rays indicated that there is structural bunion deformity left over right with redness and pain around the joint surface

## 2019-08-20 ENCOUNTER — Other Ambulatory Visit: Payer: Medicare Other | Admitting: Orthotics

## 2019-09-26 ENCOUNTER — Ambulatory Visit: Payer: Medicare Other | Admitting: Obstetrics and Gynecology

## 2019-10-10 ENCOUNTER — Other Ambulatory Visit: Payer: Self-pay

## 2019-10-10 ENCOUNTER — Encounter (HOSPITAL_COMMUNITY): Payer: Self-pay | Admitting: *Deleted

## 2019-10-10 ENCOUNTER — Encounter (HOSPITAL_COMMUNITY): Payer: Self-pay

## 2019-10-10 ENCOUNTER — Emergency Department (HOSPITAL_COMMUNITY)
Admission: EM | Admit: 2019-10-10 | Discharge: 2019-10-10 | Disposition: A | Discharge status: Home / Self Care | Payer: Medicare Other | Attending: Emergency Medicine | Admitting: Emergency Medicine

## 2019-10-10 DIAGNOSIS — R1110 Vomiting, unspecified: Secondary | ICD-10-CM | POA: Diagnosis present

## 2019-10-10 DIAGNOSIS — K3184 Gastroparesis: Secondary | ICD-10-CM | POA: Insufficient documentation

## 2019-10-10 DIAGNOSIS — E875 Hyperkalemia: Secondary | ICD-10-CM | POA: Insufficient documentation

## 2019-10-10 DIAGNOSIS — E1022 Type 1 diabetes mellitus with diabetic chronic kidney disease: Secondary | ICD-10-CM | POA: Diagnosis not present

## 2019-10-10 DIAGNOSIS — E1065 Type 1 diabetes mellitus with hyperglycemia: Secondary | ICD-10-CM | POA: Insufficient documentation

## 2019-10-10 DIAGNOSIS — E1043 Type 1 diabetes mellitus with diabetic autonomic (poly)neuropathy: Principal | ICD-10-CM | POA: Insufficient documentation

## 2019-10-10 DIAGNOSIS — Z992 Dependence on renal dialysis: Secondary | ICD-10-CM | POA: Insufficient documentation

## 2019-10-10 DIAGNOSIS — Z794 Long term (current) use of insulin: Secondary | ICD-10-CM | POA: Diagnosis not present

## 2019-10-10 DIAGNOSIS — I160 Hypertensive urgency: Secondary | ICD-10-CM | POA: Diagnosis not present

## 2019-10-10 DIAGNOSIS — I120 Hypertensive chronic kidney disease with stage 5 chronic kidney disease or end stage renal disease: Secondary | ICD-10-CM | POA: Diagnosis not present

## 2019-10-10 DIAGNOSIS — R109 Unspecified abdominal pain: Secondary | ICD-10-CM | POA: Insufficient documentation

## 2019-10-10 DIAGNOSIS — Z79899 Other long term (current) drug therapy: Secondary | ICD-10-CM | POA: Diagnosis not present

## 2019-10-10 DIAGNOSIS — Z940 Kidney transplant status: Secondary | ICD-10-CM | POA: Diagnosis not present

## 2019-10-10 DIAGNOSIS — Z20822 Contact with and (suspected) exposure to covid-19: Secondary | ICD-10-CM | POA: Diagnosis not present

## 2019-10-10 DIAGNOSIS — R9431 Abnormal electrocardiogram [ECG] [EKG]: Secondary | ICD-10-CM | POA: Diagnosis not present

## 2019-10-10 DIAGNOSIS — N186 End stage renal disease: Secondary | ICD-10-CM | POA: Diagnosis not present

## 2019-10-10 DIAGNOSIS — Z5321 Procedure and treatment not carried out due to patient leaving prior to being seen by health care provider: Secondary | ICD-10-CM | POA: Insufficient documentation

## 2019-10-10 LAB — CBC
HCT: 36.2 % (ref 36.0–46.0)
Hemoglobin: 11.5 g/dL — ABNORMAL LOW (ref 12.0–15.0)
MCH: 30.7 pg (ref 26.0–34.0)
MCHC: 31.8 g/dL (ref 30.0–36.0)
MCV: 96.5 fL (ref 80.0–100.0)
Platelets: 145 10*3/uL — ABNORMAL LOW (ref 150–400)
RBC: 3.75 MIL/uL — ABNORMAL LOW (ref 3.87–5.11)
RDW: 14.4 % (ref 11.5–15.5)
WBC: 6.2 10*3/uL (ref 4.0–10.5)
nRBC: 0 % (ref 0.0–0.2)

## 2019-10-10 LAB — COMPREHENSIVE METABOLIC PANEL
ALT: 18 U/L (ref 0–44)
AST: 21 U/L (ref 15–41)
Albumin: 4.2 g/dL (ref 3.5–5.0)
Alkaline Phosphatase: 72 U/L (ref 38–126)
Anion gap: 16 — ABNORMAL HIGH (ref 5–15)
BUN: 22 mg/dL — ABNORMAL HIGH (ref 6–20)
CO2: 24 mmol/L (ref 22–32)
Calcium: 10.6 mg/dL — ABNORMAL HIGH (ref 8.9–10.3)
Chloride: 96 mmol/L — ABNORMAL LOW (ref 98–111)
Creatinine, Ser: 5.26 mg/dL — ABNORMAL HIGH (ref 0.44–1.00)
GFR calc Af Amer: 11 mL/min — ABNORMAL LOW (ref >60)
GFR calc non Af Amer: 10 mL/min — ABNORMAL LOW (ref >60)
Glucose, Bld: 271 mg/dL — ABNORMAL HIGH (ref 70–99)
Potassium: 4.3 mmol/L (ref 3.5–5.1)
Sodium: 136 mmol/L (ref 135–145)
Total Bilirubin: 0.9 mg/dL (ref 0.3–1.2)
Total Protein: 8.9 g/dL — ABNORMAL HIGH (ref 6.5–8.1)

## 2019-10-10 LAB — I-STAT BETA HCG BLOOD, ED (MC, WL, AP ONLY): I-stat hCG, quantitative: <5 m[IU]/mL (ref <5)

## 2019-10-10 LAB — LIPASE, BLOOD: Lipase: 73 U/L — ABNORMAL HIGH (ref 11–51)

## 2019-10-10 MED ORDER — SODIUM CHLORIDE 0.9% FLUSH
3.0000 mL | Freq: Once | INTRAVENOUS | Status: AC
  Administered 2019-10-11: 3 mL via INTRAVENOUS

## 2019-10-10 MED ORDER — ONDANSETRON 4 MG PO TBDP
4.0000 mg | ORAL_TABLET | Freq: Once | ORAL | Status: AC | PRN
  Administered 2019-10-10: 4 mg via ORAL
  Filled 2019-10-10: qty 1

## 2019-10-10 MED ORDER — SODIUM CHLORIDE 0.9% FLUSH: 3.0000 mL | Freq: Once | INTRAVENOUS | Status: DC

## 2019-10-10 NOTE — ED Notes (Signed)
No answer

## 2019-10-10 NOTE — ED Notes (Signed)
Will obtain blood work in back. Pt needs ultrasound iv

## 2019-10-10 NOTE — ED Notes (Signed)
IV team at bedside to de-access HD access,

## 2019-10-10 NOTE — ED Triage Notes (Signed)
Pt complains of abdominal pain and a headache since after dialysis this morning, pt keeps saying "ithurts, doctor it hurts, doctor please it hurts so bad, I think it's the gastroparesis."

## 2019-10-10 NOTE — ED Triage Notes (Signed)
Pt arrived from HD and states this am she ate sausage before going to HD. Pt completed HD treatment ( 3 hours) abdomen continued to hurt and she was dry heaving.  Pt CBg 283 and last BP was 200/110.  Pt has gastroparesis and usually takes Reglan and something else for this.  Pt is crying and moaning at triage.  Pt is still hooked up to left upper arm HD graft. Pt was given Clonidine 0.1mg  po.

## 2019-10-10 NOTE — ED Notes (Signed)
Called pt for repeat vitals no answer

## 2019-10-11 ENCOUNTER — Emergency Department (HOSPITAL_COMMUNITY): Payer: Medicare Other

## 2019-10-11 ENCOUNTER — Observation Stay (HOSPITAL_COMMUNITY)
Admission: EM | Admit: 2019-10-11 | Discharge: 2019-10-12 | Disposition: A | Discharge status: Home / Self Care | Payer: Medicare Other | Attending: Internal Medicine | Admitting: Internal Medicine

## 2019-10-11 ENCOUNTER — Encounter (HOSPITAL_COMMUNITY): Payer: Self-pay | Admitting: Emergency Medicine

## 2019-10-11 DIAGNOSIS — Z23 Encounter for immunization: Secondary | ICD-10-CM | POA: Diagnosis not present

## 2019-10-11 DIAGNOSIS — E1043 Type 1 diabetes mellitus with diabetic autonomic (poly)neuropathy: Secondary | ICD-10-CM | POA: Diagnosis not present

## 2019-10-11 DIAGNOSIS — Z79891 Long term (current) use of opiate analgesic: Secondary | ICD-10-CM | POA: Diagnosis not present

## 2019-10-11 DIAGNOSIS — K219 Gastro-esophageal reflux disease without esophagitis: Secondary | ICD-10-CM | POA: Diagnosis not present

## 2019-10-11 DIAGNOSIS — K3184 Gastroparesis: Secondary | ICD-10-CM | POA: Diagnosis not present

## 2019-10-11 DIAGNOSIS — Z8261 Family history of arthritis: Secondary | ICD-10-CM | POA: Diagnosis not present

## 2019-10-11 DIAGNOSIS — E875 Hyperkalemia: Secondary | ICD-10-CM | POA: Diagnosis not present

## 2019-10-11 DIAGNOSIS — Q970 Karyotype 47, XXX: Secondary | ICD-10-CM | POA: Diagnosis not present

## 2019-10-11 DIAGNOSIS — N2581 Secondary hyperparathyroidism of renal origin: Secondary | ICD-10-CM | POA: Diagnosis not present

## 2019-10-11 DIAGNOSIS — E785 Hyperlipidemia, unspecified: Secondary | ICD-10-CM | POA: Diagnosis not present

## 2019-10-11 DIAGNOSIS — Z91018 Allergy to other foods: Secondary | ICD-10-CM | POA: Diagnosis not present

## 2019-10-11 DIAGNOSIS — Z881 Allergy status to other antibiotic agents status: Secondary | ICD-10-CM | POA: Diagnosis not present

## 2019-10-11 DIAGNOSIS — Z992 Dependence on renal dialysis: Secondary | ICD-10-CM

## 2019-10-11 DIAGNOSIS — G8929 Other chronic pain: Secondary | ICD-10-CM | POA: Diagnosis not present

## 2019-10-11 DIAGNOSIS — E1143 Type 2 diabetes mellitus with diabetic autonomic (poly)neuropathy: Secondary | ICD-10-CM

## 2019-10-11 DIAGNOSIS — R9431 Abnormal electrocardiogram [ECG] [EKG]: Secondary | ICD-10-CM

## 2019-10-11 DIAGNOSIS — Z20822 Contact with and (suspected) exposure to covid-19: Secondary | ICD-10-CM | POA: Diagnosis not present

## 2019-10-11 DIAGNOSIS — I120 Hypertensive chronic kidney disease with stage 5 chronic kidney disease or end stage renal disease: Secondary | ICD-10-CM | POA: Diagnosis not present

## 2019-10-11 DIAGNOSIS — N186 End stage renal disease: Secondary | ICD-10-CM

## 2019-10-11 DIAGNOSIS — K920 Hematemesis: Secondary | ICD-10-CM | POA: Diagnosis not present

## 2019-10-11 DIAGNOSIS — Z9049 Acquired absence of other specified parts of digestive tract: Secondary | ICD-10-CM | POA: Diagnosis not present

## 2019-10-11 DIAGNOSIS — T781XXA Other adverse food reactions, not elsewhere classified, initial encounter: Secondary | ICD-10-CM | POA: Diagnosis not present

## 2019-10-11 DIAGNOSIS — R778 Other specified abnormalities of plasma proteins: Secondary | ICD-10-CM | POA: Diagnosis not present

## 2019-10-11 DIAGNOSIS — I959 Hypotension, unspecified: Secondary | ICD-10-CM | POA: Diagnosis not present

## 2019-10-11 DIAGNOSIS — T8612 Kidney transplant failure: Secondary | ICD-10-CM | POA: Diagnosis not present

## 2019-10-11 DIAGNOSIS — Y830 Surgical operation with transplant of whole organ as the cause of abnormal reaction of the patient, or of later complication, without mention of misadventure at the time of the procedure: Secondary | ICD-10-CM | POA: Diagnosis not present

## 2019-10-11 DIAGNOSIS — R739 Hyperglycemia, unspecified: Secondary | ICD-10-CM

## 2019-10-11 DIAGNOSIS — Z888 Allergy status to other drugs, medicaments and biological substances status: Secondary | ICD-10-CM | POA: Diagnosis not present

## 2019-10-11 DIAGNOSIS — D631 Anemia in chronic kidney disease: Secondary | ICD-10-CM | POA: Diagnosis not present

## 2019-10-11 DIAGNOSIS — I160 Hypertensive urgency: Secondary | ICD-10-CM

## 2019-10-11 LAB — COMPREHENSIVE METABOLIC PANEL
ALT: 21 U/L (ref 0–44)
AST: 25 U/L (ref 15–41)
Albumin: 4.7 g/dL (ref 3.5–5.0)
Alkaline Phosphatase: 71 U/L (ref 38–126)
Anion gap: 22 — ABNORMAL HIGH (ref 5–15)
BUN: 41 mg/dL — ABNORMAL HIGH (ref 6–20)
CO2: 26 mmol/L (ref 22–32)
Calcium: 9.9 mg/dL (ref 8.9–10.3)
Chloride: 89 mmol/L — ABNORMAL LOW (ref 98–111)
Creatinine, Ser: 7.49 mg/dL — ABNORMAL HIGH (ref 0.44–1.00)
GFR calc Af Amer: 7 mL/min — ABNORMAL LOW (ref >60)
GFR calc non Af Amer: 6 mL/min — ABNORMAL LOW (ref >60)
Glucose, Bld: 386 mg/dL — ABNORMAL HIGH (ref 70–99)
Potassium: 6.8 mmol/L (ref 3.5–5.1)
Sodium: 137 mmol/L (ref 135–145)
Total Bilirubin: 0.8 mg/dL (ref 0.3–1.2)
Total Protein: 9.9 g/dL — ABNORMAL HIGH (ref 6.5–8.1)

## 2019-10-11 LAB — CBG MONITORING, ED
Glucose-Capillary: 370 mg/dL — ABNORMAL HIGH (ref 70–99)
Glucose-Capillary: 380 mg/dL — ABNORMAL HIGH (ref 70–99)
Glucose-Capillary: 414 mg/dL — ABNORMAL HIGH (ref 70–99)
Glucose-Capillary: 165 mg/dL — ABNORMAL HIGH (ref 70–99)

## 2019-10-11 LAB — CBC
HCT: 50.9 % — ABNORMAL HIGH (ref 36.0–46.0)
HCT: 33.5 % — ABNORMAL LOW (ref 36.0–46.0)
Hemoglobin: 16.9 g/dL — ABNORMAL HIGH (ref 12.0–15.0)
Hemoglobin: 10.9 g/dL — ABNORMAL LOW (ref 12.0–15.0)
MCH: 31.4 pg (ref 26.0–34.0)
MCH: 31 pg (ref 26.0–34.0)
MCHC: 33.2 g/dL (ref 30.0–36.0)
MCHC: 32.5 g/dL (ref 30.0–36.0)
MCV: 94.4 fL (ref 80.0–100.0)
MCV: 95.2 fL (ref 80.0–100.0)
Platelets: 115 10*3/uL — ABNORMAL LOW (ref 150–400)
Platelets: 181 10*3/uL (ref 150–400)
RBC: 5.39 MIL/uL — ABNORMAL HIGH (ref 3.87–5.11)
RBC: 3.52 MIL/uL — ABNORMAL LOW (ref 3.87–5.11)
RDW: 14.6 % (ref 11.5–15.5)
RDW: 14.6 % (ref 11.5–15.5)
WBC: 4.5 10*3/uL (ref 4.0–10.5)
WBC: 9.8 10*3/uL (ref 4.0–10.5)
nRBC: 0 % (ref 0.0–0.2)
nRBC: 0 % (ref 0.0–0.2)

## 2019-10-11 LAB — SARS CORONAVIRUS 2 BY RT PCR (HOSPITAL ORDER, PERFORMED IN ~~LOC~~ HOSPITAL LAB): SARS Coronavirus 2: NEGATIVE

## 2019-10-11 LAB — BASIC METABOLIC PANEL
Anion gap: 16 — ABNORMAL HIGH (ref 5–15)
BUN: 24 mg/dL — ABNORMAL HIGH (ref 6–20)
CO2: 27 mmol/L (ref 22–32)
Calcium: 10.9 mg/dL — ABNORMAL HIGH (ref 8.9–10.3)
Chloride: 95 mmol/L — ABNORMAL LOW (ref 98–111)
Creatinine, Ser: 4.73 mg/dL — ABNORMAL HIGH (ref 0.44–1.00)
GFR calc Af Amer: 13 mL/min — ABNORMAL LOW (ref >60)
GFR calc non Af Amer: 11 mL/min — ABNORMAL LOW (ref >60)
Glucose, Bld: 220 mg/dL — ABNORMAL HIGH (ref 70–99)
Potassium: 3.8 mmol/L (ref 3.5–5.1)
Sodium: 138 mmol/L (ref 135–145)

## 2019-10-11 LAB — I-STAT BETA HCG BLOOD, ED (MC, WL, AP ONLY): I-stat hCG, quantitative: <5 m[IU]/mL (ref <5)

## 2019-10-11 LAB — GLUCOSE, CAPILLARY
Glucose-Capillary: 175 mg/dL — ABNORMAL HIGH (ref 70–99)
Glucose-Capillary: 177 mg/dL — ABNORMAL HIGH (ref 70–99)

## 2019-10-11 LAB — LIPASE, BLOOD: Lipase: 64 U/L — ABNORMAL HIGH (ref 11–51)

## 2019-10-11 LAB — POTASSIUM: Potassium: 7 mmol/L (ref 3.5–5.1)

## 2019-10-11 LAB — MAGNESIUM: Magnesium: 2.2 mg/dL (ref 1.7–2.4)

## 2019-10-11 MED ORDER — LORAZEPAM 2 MG/ML IJ SOLN
1.0000 mg | Freq: Four times a day (QID) | INTRAMUSCULAR | Status: DC | PRN
  Administered 2019-10-11 – 2019-10-12 (×3): 1 mg via INTRAVENOUS
  Filled 2019-10-11 (×3): qty 1

## 2019-10-11 MED ORDER — LIDOCAINE-PRILOCAINE 2.5-2.5 % EX CREA
1.0000 "application " | TOPICAL_CREAM | CUTANEOUS | Status: DC | PRN
  Filled 2019-10-11: qty 5

## 2019-10-11 MED ORDER — METOCLOPRAMIDE HCL 5 MG/ML IJ SOLN
10.0000 mg | Freq: Once | INTRAMUSCULAR | Status: AC
  Administered 2019-10-11: 10 mg via INTRAVENOUS
  Filled 2019-10-11: qty 2

## 2019-10-11 MED ORDER — CALCIUM GLUCONATE-NACL 1-0.675 GM/50ML-% IV SOLN
1.0000 g | Freq: Once | INTRAVENOUS | Status: AC
  Administered 2019-10-11: 1000 mg via INTRAVENOUS
  Filled 2019-10-11: qty 50

## 2019-10-11 MED ORDER — ALBUTEROL SULFATE HFA 108 (90 BASE) MCG/ACT IN AERS
10.0000 | INHALATION_SPRAY | Freq: Once | RESPIRATORY_TRACT | Status: AC
  Administered 2019-10-11: 10 via RESPIRATORY_TRACT
  Filled 2019-10-11: qty 6.7

## 2019-10-11 MED ORDER — PANTOPRAZOLE SODIUM 40 MG IV SOLR
40.0000 mg | Freq: Two times a day (BID) | INTRAVENOUS | Status: DC
  Administered 2019-10-11 – 2019-10-12 (×3): 40 mg via INTRAVENOUS
  Filled 2019-10-11 (×4): qty 40

## 2019-10-11 MED ORDER — PENTAFLUOROPROP-TETRAFLUOROETH EX AERO
1.0000 "application " | INHALATION_SPRAY | CUTANEOUS | Status: DC | PRN
  Filled 2019-10-11: qty 116

## 2019-10-11 MED ORDER — PANTOPRAZOLE SODIUM 40 MG IV SOLR
40.0000 mg | Freq: Once | INTRAVENOUS | Status: AC
  Administered 2019-10-11: 40 mg via INTRAVENOUS
  Filled 2019-10-11: qty 40

## 2019-10-11 MED ORDER — SODIUM BICARBONATE 8.4 % IV SOLN
50.0000 meq | Freq: Once | INTRAVENOUS | Status: AC
  Administered 2019-10-11: 50 meq via INTRAVENOUS
  Filled 2019-10-11: qty 50

## 2019-10-11 MED ORDER — HEPARIN SODIUM (PORCINE) 1000 UNIT/ML DIALYSIS
4000.0000 [IU] | INTRAMUSCULAR | Status: DC | PRN
  Administered 2019-10-12: 4000 [IU] via INTRAVENOUS_CENTRAL
  Filled 2019-10-11: qty 4

## 2019-10-11 MED ORDER — SODIUM BICARBONATE 8.4 % IV SOLN
50.0000 meq | Freq: Once | INTRAVENOUS | Status: AC
  Administered 2019-10-11: 50 meq via INTRAVENOUS

## 2019-10-11 MED ORDER — HYDROMORPHONE HCL 1 MG/ML IJ SOLN
0.5000 mg | INTRAMUSCULAR | Status: DC | PRN
  Administered 2019-10-11 – 2019-10-12 (×3): 0.5 mg via INTRAVENOUS
  Filled 2019-10-11 (×3): qty 1

## 2019-10-11 MED ORDER — SODIUM CHLORIDE 0.9 % IV SOLN: 100.0000 mL | INTRAVENOUS | Status: DC | PRN

## 2019-10-11 MED ORDER — OXYCODONE-ACETAMINOPHEN 5-325 MG PO TABS: 1.0000 | ORAL_TABLET | ORAL | Status: DC | PRN

## 2019-10-11 MED ORDER — INSULIN ASPART 100 UNIT/ML ~~LOC~~ SOLN
0.0000 [IU] | SUBCUTANEOUS | Status: DC
  Administered 2019-10-11 (×3): 1 [IU] via SUBCUTANEOUS
  Administered 2019-10-11: 6 [IU] via SUBCUTANEOUS
  Administered 2019-10-12 (×2): 1 [IU] via SUBCUTANEOUS

## 2019-10-11 MED ORDER — DEXTROSE 50 % IV SOLN
50.0000 mL | Freq: Once | INTRAVENOUS | Status: AC
  Administered 2019-10-11: 50 mL via INTRAVENOUS
  Filled 2019-10-11: qty 50

## 2019-10-11 MED ORDER — ACETAMINOPHEN 650 MG RE SUPP: 650.0000 mg | Freq: Four times a day (QID) | RECTAL | Status: DC | PRN

## 2019-10-11 MED ORDER — ACETAMINOPHEN 325 MG PO TABS
650.0000 mg | ORAL_TABLET | Freq: Four times a day (QID) | ORAL | Status: DC | PRN
  Administered 2019-10-11 (×2): 650 mg via ORAL
  Filled 2019-10-11: qty 2

## 2019-10-11 MED ORDER — SCOPOLAMINE 1 MG/3DAYS TD PT72
1.0000 | MEDICATED_PATCH | TRANSDERMAL | Status: DC
  Administered 2019-10-11: 1.5 mg via TRANSDERMAL
  Filled 2019-10-11: qty 1

## 2019-10-11 MED ORDER — LIDOCAINE HCL (PF) 1 % IJ SOLN: 5.0000 mL | INTRAMUSCULAR | Status: DC | PRN

## 2019-10-11 MED ORDER — INSULIN GLARGINE 100 UNIT/ML ~~LOC~~ SOLN
20.0000 [IU] | Freq: Every day | SUBCUTANEOUS | Status: DC
  Administered 2019-10-11: 20 [IU] via SUBCUTANEOUS
  Filled 2019-10-11 (×3): qty 0.2

## 2019-10-11 MED ORDER — INSULIN ASPART 100 UNIT/ML IV SOLN
5.0000 [IU] | Freq: Once | INTRAVENOUS | Status: AC
  Administered 2019-10-11: 5 [IU] via INTRAVENOUS
  Filled 2019-10-11: qty 0.05

## 2019-10-11 MED ORDER — HYDRALAZINE HCL 20 MG/ML IJ SOLN
5.0000 mg | INTRAMUSCULAR | Status: DC | PRN
  Administered 2019-10-11: 5 mg via INTRAVENOUS
  Filled 2019-10-11: qty 1

## 2019-10-11 MED ORDER — SODIUM ZIRCONIUM CYCLOSILICATE 10 G PO PACK
10.0000 g | PACK | ORAL | Status: AC
  Administered 2019-10-11: 10 g via ORAL
  Filled 2019-10-11: qty 1

## 2019-10-11 MED ORDER — ACETAMINOPHEN 325 MG PO TABS
ORAL_TABLET | ORAL | Status: AC
  Filled 2019-10-11: qty 2

## 2019-10-11 NOTE — ED Notes (Signed)
Repeat potassium collected and sent to lab.

## 2019-10-11 NOTE — H&P (Signed)
History and Physical    [name] CWC:[id number] DOB: 1985/08/18 DOA: 10/11/2019  PCP: [name], MD Patient coming from: Home  Chief Complaint: Abdominal pain, vomiting  HPI: [name] is a 34 y.o. female with medical history significant of ESRD on HD MWF, history of renal transplant, anemia of ESRD, type 1 diabetes, hypertension, hyperlipidemia, GERD, gastroparesis, XXX syndrome presenting with complaints of abdominal pain and vomiting.  Patient states she was not able to tolerate her entire session of dialysis yesterday due to abdominal pain.  Reports generalized abdominal pain.  She has continued to vomit since then and not able to tolerate p.o. intake.  Reports vomiting blood.  Denies cough, shortness of breath, or chest pain.  Denies marijuana or any other drug use.  Denies alcohol use.  No additional history could be obtained from her.  ED Course: Afebrile.  Tachycardic on arrival with heart rate in the 120s.  Blood pressure elevated with systolic up to 761Y and diastolic up to 073X.  Labs showing no leukocytosis.  Hemoglobin 16.9.  Platelet count 115.    Potassium 6.8 on initial labs. Repeat labs confirmed hyperkalemia with potassium 7.0.  Chloride 89. Blood glucose 386.  Bicarb 26.  No significant elevation of lipase or LFTs.  Beta hCG negative.  SARS-CoV-2 PCR test pending.  CT showing small hiatal hernia with reflux in the distal esophagus.  No other acute intra-abdominal pathology.  EKG with QT prolongation and peaked T waves.  Patient was given medical treatment for hyperkalemia including albuterol, insulin, Lokelma, sodium bicarb, and calcium gluconate.  She was also given Reglan and Protonix.  Review of Systems:  All systems reviewed and apart from history of presenting illness, are negative.  Past Medical History:  Diagnosis Date  . Anemia   . Chronic kidney disease    kidney transplant 07  . Diabetes mellitus    Pt reports diagnosis in June 2011, Type 2  .  GERD (gastroesophageal reflux disease)   . Hyperlipidemia   . Hypertension   . 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 Pyelonephritis 06/23/2014  . Seasonal allergies   . UTI (urinary tract infection) 01/09/2015  . 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 ARTERIOVENOUS GRAFT PLACEMENT Bilateral    "neither work" (10/24/2017)  . AV FISTULA PLACEMENT Left 10/26/2018   Procedure: CREATION OF ARTERIOVENOUS FISTULA  LEFT ARM;  Surgeon: [name], MD;  Location: [facility];  Service: Vascular;  Laterality: Left;  . BASCILIC VEIN TRANSPOSITION Left 12/21/2018   Procedure: Left arm BASILIC VEIN TRANSPOSITION SECOND STAGE;  Surgeon: [name], MD;  Location: [facility];  Service: Vascular;  Laterality: Left;  . CHOLECYSTECTOMY N/A 06/30/2017   Procedure: LAPAROSCOPIC CHOLECYSTECTOMY WITH INTRAOPERATIVE CHOLANGIOGRAM;  Surgeon: Excell Seltzer, MD;  Location: [facility];  Service: General;  Laterality: N/A;  . ESOPHAGOGASTRODUODENOSCOPY (EGD) WITH PROPOFOL N/A 07/04/2017   Procedure: ESOPHAGOGASTRODUODENOSCOPY (EGD) WITH PROPOFOL;  Surgeon: Clarene Essex, MD;  Location: [facility];  Service: Endoscopy;  Laterality: N/A;  . INSERTION OF DIALYSIS CATHETER N/A 03/20/2018   Procedure: INSERTION OF TUNNELED DIALYSIS CATHETER - RIGHT INTERANL JUGULAR PLACEMENT;  Surgeon: Angelia Mould, MD;  Location: [place];  Service: Vascular;  Laterality: N/A;  . IR GUIDED  [place] PLACEMENT  10/28/2018  . KIDNEY TRANSPLANT  2007  . PARATHYROIDECTOMY  ?2012   "3/4 removed" (10/24/2017)  . RENAL BIOPSY Bilateral 2003  . UPPER EXTREMITY VENOGRAPHY Bilateral 10/19/2018   Procedure: UPPER EXTREMITY VENOGRAPHY;  Surgeon: [name], MD;  Location: [facility];  Service:  Cardiovascular;  Laterality: Bilateral;  Bilateral      reports that she has never smoked. She has never used smokeless tobacco. She reports that she does not drink alcohol and does not use drugs.  Allergies  Allergen Reactions  . Benadryl [Diphenhydramine-Zinc Acetate] Shortness Of Breath  . Diphenhydramine Hcl Anaphylaxis    REACTION: Stopped breathing in ICU  . Doxycycline Shortness Of Breath  . Motrin [Ibuprofen] Shortness Of Breath and Itching    Per pt  . Banana Other (See Comments)    Sick on the stomach  . Iron Dextran Other (See Comments)    REACTION: vein irritation  . Shellfish Allergy Hives  . Chlorhexidine Itching    Family History  Problem Relation Age of Onset  . Arthritis Mother   . Hypertension Mother   . Aneurysm Mother        died of brain aneurysm  . CAD Father        Has 3 stents  . Diabetes Father        borderline  . Early death Brother        Died in war    Prior to Admission medications   Medication Sig Start Date End Date Taking? Authorizing Provider  ACCU-CHEK SOFTCLIX LANCETS lancets Use to check blood sugar 4 times per day. 12/29/15   Renato Shin, MD  acetaminophen (TYLENOL) 500 MG tablet Take 1,000 mg by mouth every 6 (six) hours as needed for moderate pain (pain).     [provider]  b complex-vitamin c-folic acid (NEPHRO-VITE) 0.8 MG TABS tablet Take 1 tablet by mouth every Monday, Wednesday, and Friday. After dialysis on dialysis days 07/19/18   [provider]  calcium acetate (PHOSLO) 667 MG capsule Take 1,334 mg by mouth 2 (two) times daily. Taking 1 capsule with a snack 11/24/18   [provider]  Calcium Carbonate Antacid (CALCIUM CARBONATE, DOSED IN MG ELEMENTAL CALCIUM,) 1250 MG/5ML SUSP Take 5 mLs by mouth 3 (three) times daily.  11/17/18   [provider]  cetirizine (ZYRTEC) 10 MG tablet Take 10 mg by mouth daily as needed for allergies.    [provider]  D 1000 25 MCG (1000 UT) capsule  Take 1,000 Units by mouth every other day.  04/27/19   [provider]  diclofenac Sodium (VOLTAREN) 1 % GEL Apply 2 g topically 3 (three) times daily as needed (pain).  04/19/19   [provider]  dicyclomine (BENTYL) 20 MG tablet Take 1 tablet (20 mg total) by mouth 2 (two) times daily as needed (stomach issues.). 06/22/19   Dessa Phi, DO  fluticasone (FLONASE) 50 MCG/ACT nasal spray Place 2 sprays into both nostrils daily as needed for allergies. 09/03/18   [name], MD  gabapentin (NEURONTIN) 400 MG capsule Take 400 mg by mouth 3 (three) times daily. 04/29/19   [provider]  hydrOXYzine (ATARAX/VISTARIL) 50 MG tablet Take 1 tablet (50 mg total) by mouth 2 (two) times daily as needed for itching. 02/06/18   [name], Ripudeep K, MD  insulin aspart (NOVOLOG FLEXPEN) 100 UNIT/ML FlexPen Inject 2-10 Units into the skin 3 (three) times daily with meals. as needed  for blood sugar management (sliding scale)    [provider]  insulin degludec (TRESIBA) 100 UNIT/ML SOPN FlexTouch Pen Inject 0.6 mLs (60 Units total) into the skin 2 (two) times daily. Reports taking 24 units QAM Patient taking differently: Inject 20 Units into the skin daily.  03/06/18   Charlynne Cousins, MD  loperamide (IMODIUM) 2 MG capsule Take 2 mg by mouth 4 (four) times daily as needed for diarrhea or loose stools.     [provider]  Methoxy PEG-Epoetin Beta (MIRCERA IJ) Mircera 07/23/19 07/21/20  [provider]  metoCLOPramide (REGLAN) 10 MG tablet Take 1 tablet (10 mg total) by mouth 3 (three) times daily before meals. 06/22/19   Dessa Phi, DO  metoprolol tartrate (LOPRESSOR) 25 MG tablet Take 1 tablet (25 mg total) by mouth 2 (two) times daily. Patient taking differently: Take 25 mg by mouth daily.  02/07/19 06/19/19  Wyvonnia Dusky, MD  ondansetron (ZOFRAN ODT) 4 MG disintegrating tablet Take 1 tablet (4 mg total) by mouth every 8 (eight) hours as needed for nausea or  vomiting. 06/22/19   Dessa Phi, DO  oxyCODONE-acetaminophen (PERCOCET/ROXICET) 5-325 MG tablet  07/19/19   [provider]  pantoprazole (PROTONIX) 40 MG tablet Take 40 mg by mouth See admin instructions. Once to twice daily    [provider]  predniSONE (DELTASONE) 20 MG tablet Take 60 mg by mouth daily. 07/11/19   [provider]  simvastatin (ZOCOR) 20 MG tablet Take 20 mg by mouth at bedtime.     [provider]  amLODipine (NORVASC) 10 MG tablet Take 1 tablet (10 mg total) by mouth daily. 03/25/18 02/07/19  Thurnell Lose, MD    Physical Exam: Vitals:   10/10/19 2257 10/11/19 0307  BP: (!) 149/115 (!) 199/122  Pulse: (!) 123 (!) 116  Resp: 18 18  Temp: 98.4 F (36.9 C)   TempSrc: Oral   SpO2: 98% 94%  Weight: 72.6 kg   Height: 5\' 6"  (1.676 m)     Physical Exam Constitutional:      Appearance: She is not toxic-appearing.  HENT:     Head: Normocephalic and atraumatic.  Eyes:     Extraocular Movements: Extraocular movements intact.     Conjunctiva/sclera: Conjunctivae normal.  Cardiovascular:     Rate and Rhythm: Regular rhythm. Tachycardia present.     Pulses: Normal pulses.  Pulmonary:     Effort: Pulmonary effort is normal. No respiratory distress.     Breath sounds: Normal breath sounds. No wheezing or rales.  Abdominal:     General: Bowel sounds are normal. There is no distension.     Palpations: Abdomen is soft.     Tenderness: There is abdominal tenderness. There is guarding. There is no rebound.     Comments: Generalized tenderness to palpation with guarding  Musculoskeletal:        General: No swelling or tenderness.     Cervical back: Normal range of motion and neck supple.  Skin:    General: Skin is warm and dry.  Neurological:     General: No focal deficit present.     Mental Status: She is alert and oriented to person, place, and time.     Labs on Admission: I have personally reviewed following labs and imaging  studies  CBC: Recent Labs  Lab 10/10/19 1215 10/11/19 0148  WBC 6.2 4.5  HGB 11.5* 16.9*  HCT 36.2 50.9*  MCV 96.5 94.4  PLT 145* 115*  Basic Metabolic Panel: Recent Labs  Lab 10/10/19 1215 10/11/19 0148 10/11/19 0450  NA 136 137  --   K 4.3 6.8* 7.0*  CL 96* 89*  --   CO2 24 26  --   GLUCOSE 271* 386*  --   BUN 22* 41*  --   CREATININE 5.26* 7.49*  --   CALCIUM 10.6* 9.9  --    GFR: Estimated Creatinine Clearance: 10.6 mL/min (A) (by C-G formula based on SCr of 7.49 mg/dL (H)). Liver Function Tests: Recent Labs  Lab 10/10/19 1215 10/11/19 0148  AST 21 25  ALT 18 21  ALKPHOS 72 71  BILITOT 0.9 0.8  PROT 8.9* 9.9*  ALBUMIN 4.2 4.7   Recent Labs  Lab 10/10/19 1215 10/11/19 0148  LIPASE 73* 64*   No results for input(s): AMMONIA in the last 168 hours. Coagulation Profile: No results for input(s): INR, PROTIME in the last 168 hours. Cardiac Enzymes: No results for input(s): CKTOTAL, CKMB, CKMBINDEX, TROPONINI in the last 168 hours. BNP (last 3 results) No results for input(s): PROBNP in the last 8760 hours. HbA1C: No results for input(s): HGBA1C in the last 72 hours. CBG: Recent Labs  Lab 10/11/19 0529 10/11/19 0534  GLUCAP 370* 380*   Lipid Profile: No results for input(s): CHOL, HDL, LDLCALC, TRIG, CHOLHDL, LDLDIRECT in the last 72 hours. Thyroid Function Tests: No results for input(s): TSH, T4TOTAL, FREET4, T3FREE, THYROIDAB in the last 72 hours. Anemia Panel: No results for input(s): VITAMINB12, FOLATE, FERRITIN, TIBC, IRON, RETICCTPCT in the last 72 hours. Urine analysis:    Component Value Date/Time   COLORURINE RED (A) 09/03/2018 1118   APPEARANCEUR CLOUDY (A) 09/03/2018 1118   LABSPEC 1.015 09/03/2018 1118   PHURINE 8.0 09/03/2018 1118   GLUCOSEU 250 (A) 09/03/2018 1118   HGBUR LARGE (A) 09/03/2018 1118   HGBUR negative 01/26/2010 1026   BILIRUBINUR NEGATIVE 09/03/2018 1118   BILIRUBINUR NEG 01/01/2011 1050   KETONESUR NEGATIVE  09/03/2018 1118   PROTEINUR >300 (A) 09/03/2018 1118   UROBILINOGEN 0.2 01/09/2015 1333   NITRITE POSITIVE (A) 09/03/2018 1118   LEUKOCYTESUR TRACE (A) 09/03/2018 1118    Radiological Exams on Admission: CT Renal Stone Study  Result Date: 10/11/2019 CLINICAL DATA:  Flank pain EXAM: CT ABDOMEN AND PELVIS WITHOUT CONTRAST TECHNIQUE: Multidetector CT imaging of the abdomen and pelvis was performed following the standard protocol without IV contrast. COMPARISON:  June 19, 2019 FINDINGS: Lower chest: The visualized heart size within normal limits. No pericardial fluid/thickening. There is a small hiatal hernia present with reflux in the distal esophagus. The visualized portions of the lungs are clear. Hepatobiliary: Although limited due to the lack of intravenous contrast, normal in appearance without gross focal abnormality. The patient is status post cholecystectomy. No biliary ductal dilation. Pancreas:  Unremarkable.  No surrounding inflammatory changes. Spleen: Normal in size. Although limited due to the lack of intravenous contrast, normal in appearance. Adrenals/Urinary Tract: Both adrenal glands appear normal. 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 There is stable atrophy of the left kidney. No hydronephrosis is noted. The bladder is decompressed. Stomach/Bowel: The stomach, small bowel, and colon are normal in appearance. No inflammatory changes or obstructive findings. Postsurgical changes are seen within the right lower quadrant. Vascular/Lymphatic: There are no enlarged abdominal or pelvic lymph nodes. No significant gross vascular findings are present. Reproductive: The patient is status post hysterectomy. No adnexal masses or collections seen. Other: No evidence of abdominal wall mass or hernia. Musculoskeletal: No acute  or significant osseous findings. IMPRESSION: Small hiatal hernia with reflux in the distal esophagus. No other acute intra-abdominal or pelvic pathology to explain the  patient's symptoms. Electronically Signed   By: Prudencio Pair M.D.   On: 10/11/2019 02:18    EKG: Independently reviewed.  Sinus tachycardia, heart rate 116.  Peak T waves.  QTc 527.  Assessment/Plan Principal Problem:   Hyperkalemia Active Problems:   Diabetic gastroparesis (HCC)   ESRD on hemodialysis ([place])   Hypertensive urgency   Hyperglycemia   Hyperkalemia, ESRD -Potassium 7.0.  EKG with peaked T waves. -Patient received medical treatment for hyperkalemia including albuterol, insulin, Lokelma, sodium bicarb, and calcium gluconate.  ED provider discussed the case with Dr. Johnney Ou from nephrology.  Patient is being transferred to [facility] for urgent dialysis.  [facility] at bedside.  Although bed request has been placed, there are currently no beds available at [facility]  As such, this is going to be a stat ED to ED transfer. -Repeat BMP after dialysis  Intractable nausea, vomiting, and abdominal pain -suspect secondary to diabetic gastroparesis Hematemesis -Afebrile and no leukocytosis.  No significant elevation of lipase or LFTs.  Hemoglobin elevated, suspect due to hemoconcentration from dehydration.  CT showing small hiatal hernia with reflux in the distal esophagus.  No other acute intra-abdominal pathology. -Keep n.p.o. IV Protonix twice daily, Ativan as needed for nausea/vomiting, and scopolamine patch.  Avoid Reglan given QT prolongation.  Will hold off giving IV fluid as blood pressure is significantly elevated.  Emesis appears dark, check for occult blood.  Consult GI in a.m.  Hypertensive urgency -Blood pressure elevated with systolic up to 076K and diastolic up to 088P -Order hydralazine PRN.  Urgent dialysis as mentioned above.  Hyperglycemia, type 1 diabetes -Blood glucose 386 on initial labs -Patient received NovoLog 5 units.  Order sliding scale insulin very sensitive every 4 hours.  Resume home basal insulin after pharmacy med rec is done.  QT prolongation on  EKG -Cardiac monitoring.  Monitor potassium and magnesium levels.  Avoid QT prolonging drugs if possible.  Pharmacy med rec pending.  DVT prophylaxis: SCDs at this time Code Status: Full code Family Communication: No family available at this time. Disposition Plan: Status is: Observation  The patient remains OBS appropriate and will d/c before 2 midnights.  Dispo: The patient is from: Home              Anticipated d/c is to: Home              Anticipated d/c date is: 2 days              Patient currently is not medically stable to d/c.  The medical decision making on this patient was of high complexity and the patient is at high risk for clinical deterioration, therefore this is a level 3 visit.  Shela Leff MD [facility]  If 7PM-7AM, please contact night-coverage [email or web address]  10/11/2019, 7:01 AM

## 2019-10-11 NOTE — ED Notes (Signed)
Pt states that she can't give urine at this time

## 2019-10-11 NOTE — ED Notes (Signed)
Lab called and informed that repeat potassium specimen hemolyzed and need recollection, Phlebotomist to collect. MD notified.

## 2019-10-11 NOTE — Progress Notes (Signed)
Inpatient Diabetes Program Recommendations  AACE/ADA: New Consensus Statement on Inpatient Glycemic Control (2015)  Target Ranges:  Prepandial:   less than 140 mg/dL      Peak postprandial:   less than 180 mg/dL (1-2 hours)      Critically ill patients:  140 - 180 mg/dL   Lab Results  Component Value Date   GLUCAP 414 (H) 10/11/2019   HGBA1C 9.2 (H) 06/17/2019   Results for ESPERANZA, MADRAZO (MRN [id number]) as of 10/11/2019 11:47  Ref. Range 10/11/2019 05:29 10/11/2019 05:34 10/11/2019 07:33  Glucose-Capillary Latest Ref Range: 70 - 99 mg/dL 370 (H) 380 (H) 414 (H)   Review of Glycemic Control  Diabetes history: Type 1? Outpatient Diabetes medications: Tresiba 20 units daily, Novolog 2-10 units TID Current orders for Inpatient glycemic control: Novolog 0-6 units every 4 hours.  Inpatient Diabetes Program Recommendations:   Noted that patient's blood sugars have been greater than 300 mg/dl in the ED. Noted that patient was given total of Novolog 11 units for the elevated blood sugars in ED. Lab glucose now 220 mg/dl.  Recommend adding Lantus 15 units daily or HS and continue Novolog 0-6 units every 4 hours until patient is eating, then TID & HS scale. Titrate dosages as needed.   Harvel Ricks RN BSN CDE Diabetes Coordinator Pager: [phone]  8am-5pm

## 2019-10-11 NOTE — Progress Notes (Signed)
Patient in yellow MEWS, she just transferred from ED to the floor and was always been in yellow MEWS.  Took full set of VS at rest, pt is asymptomatic and is mostly asleep.  Her current HR 110, MD is made aware of the situation. Will continue to monitor her VS q 4hrs.

## 2019-10-11 NOTE — Consult Note (Signed)
[facility] Renal Consultation Note    Indication for Consultation:  Management of ESRD/hemodialysis; anemia, hypertension/volume and secondary hyperparathyroidism  ZOX:WRUEAVW, Christean Grief, MD  HPI: [name] is a 34 y.o. female. 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 Of note patient is complaint with prescribed dialysis regimen, and typically leaves at her dry weight.   Seen and examined at bedside in dialysis.  Patient presented to the ED due to abdominal pain and vomiting which started yesterday at dialysis.  States she was unable to complete her treatment due to the pain and vomiting.  Prior to dialysis she was having no symptoms, states "its my gastroparesis".  Denies fever, chills, palpitations, CP, SOB, weakness, dizziness and fatigue.  Admits she has been eating a lot of tomato sandwiches lately.    Pertinent findings in the ED include tachycardia, hypertension, K 7.0, negative covid and CT abd showing small hiatal hernia and no acute abnormalities.  Patient has been admitted to observation for further evaluation and management.   Past Medical History:  Diagnosis Date  . Anemia   . Chronic kidney disease    kidney transplant 07  . Diabetes mellitus    Pt reports diagnosis in June 2011, Type 2  . GERD (gastroesophageal reflux disease)   . Hyperlipidemia   . Hypertension   . 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 Pyelonephritis 06/23/2014  . Seasonal allergies   . UTI (urinary tract infection) 01/09/2015  . 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   Qualifier: Diagnosis of  By: Carlena Sax  MD, Colletta [place]     Past Surgical History:  Procedure  Laterality Date  . ARTERIOVENOUS GRAFT PLACEMENT Bilateral    "neither work" (10/24/2017)  . AV FISTULA PLACEMENT Left 10/26/2018   Procedure: CREATION OF ARTERIOVENOUS FISTULA  LEFT ARM;  Surgeon: [name], MD;  Location: [place];  Service: Vascular;  Laterality: Left;  . BASCILIC VEIN TRANSPOSITION Left 12/21/2018   Procedure: Left arm BASILIC VEIN TRANSPOSITION SECOND STAGE;  Surgeon: [name], MD;  Location: [place];  Service: Vascular;  Laterality: Left;  . CHOLECYSTECTOMY N/A 06/30/2017   Procedure: LAPAROSCOPIC CHOLECYSTECTOMY WITH INTRAOPERATIVE CHOLANGIOGRAM;  Surgeon: Excell Seltzer, MD;  Location: [facility];  Service: General;  Laterality: N/A;  . ESOPHAGOGASTRODUODENOSCOPY (EGD) WITH PROPOFOL N/A 07/04/2017   Procedure: ESOPHAGOGASTRODUODENOSCOPY (EGD) WITH PROPOFOL;  Surgeon: Clarene Essex, MD;  Location: [facility];  Service: Endoscopy;  Laterality: N/A;  . INSERTION OF DIALYSIS CATHETER N/A 03/20/2018   Procedure: INSERTION OF TUNNELED DIALYSIS CATHETER - RIGHT INTERANL JUGULAR PLACEMENT;  Surgeon: Angelia Mould, MD;  Location: [place];  Service: Vascular;  Laterality: N/A;  . IR GUIDED [place]  10/28/2018  . KIDNEY TRANSPLANT  2007  . PARATHYROIDECTOMY  ?2012   "3/4 removed" (10/24/2017)  . RENAL BIOPSY Bilateral 2003  . UPPER EXTREMITY VENOGRAPHY Bilateral 10/19/2018   Procedure: UPPER EXTREMITY VENOGRAPHY;  Surgeon: [name], MD;  Location: [place] [facility];  Service: Cardiovascular;  Laterality: Bilateral;  Bilateral    Family History  Problem Relation Age of Onset  . Arthritis Mother   . Hypertension Mother   . Aneurysm Mother  died of brain aneurysm  . CAD Father        Has 3 stents  . Diabetes Father        borderline  . Early death Brother        Died in war   Social History:  reports that she has never smoked. She has never used smokeless tobacco. She reports that she does not drink alcohol and does  not use drugs. Allergies  Allergen Reactions  . Benadryl [Diphenhydramine-Zinc Acetate] Shortness Of Breath  . Diphenhydramine Hcl Anaphylaxis    REACTION: Stopped breathing in ICU  . Doxycycline Shortness Of Breath  . Motrin [Ibuprofen] Shortness Of Breath and Itching    Per pt  . Banana Other (See Comments)    Sick on the stomach  . Iron Dextran Other (See Comments)    REACTION: vein irritation  . Shellfish Allergy Hives  . Chlorhexidine Itching   Prior to Admission medications   Medication Sig Start Date End Date Taking? Authorizing Provider  ACCU-CHEK SOFTCLIX LANCETS lancets Use to check blood sugar 4 times per day. 12/29/15   Renato Shin, MD  acetaminophen (TYLENOL) 500 MG tablet Take 1,000 mg by mouth every 6 (six) hours as needed for moderate pain (pain).     [provider]  b complex-vitamin c-folic acid (NEPHRO-VITE) 0.8 MG TABS tablet Take 1 tablet by mouth every Monday, Wednesday, and Friday. After dialysis on dialysis days 07/19/18   [provider]  calcium acetate (PHOSLO) 667 MG capsule Take 1,334 mg by mouth 2 (two) times daily. Taking 1 capsule with a snack 11/24/18   [provider]  Calcium Carbonate Antacid (CALCIUM CARBONATE, DOSED IN MG ELEMENTAL CALCIUM,) 1250 MG/5ML SUSP Take 5 mLs by mouth 3 (three) times daily.  11/17/18   [provider]  cetirizine (ZYRTEC) 10 MG tablet Take 10 mg by mouth daily as needed for allergies.    [provider]  D 1000 25 MCG (1000 UT) capsule Take 1,000 Units by mouth every other day.  04/27/19   [provider]  diclofenac Sodium (VOLTAREN) 1 % GEL Apply 2 g topically 3 (three) times daily as needed (pain).  04/19/19   [provider]  dicyclomine (BENTYL) 20 MG tablet Take 1 tablet (20 mg total) by mouth 2 (two) times daily as needed (stomach issues.). 06/22/19   Dessa Phi, DO  fluticasone (FLONASE) 50 MCG/ACT nasal spray Place 2 sprays into both nostrils daily as needed  for allergies. 09/03/18   [name], MD  gabapentin (NEURONTIN) 400 MG capsule Take 400 mg by mouth 3 (three) times daily. 04/29/19   [provider]  hydrOXYzine (ATARAX/VISTARIL) 50 MG tablet Take 1 tablet (50 mg total) by mouth 2 (two) times daily as needed for itching. 02/06/18   [name], Ripudeep K, MD  insulin aspart (NOVOLOG FLEXPEN) 100 UNIT/ML FlexPen Inject 2-10 Units into the skin 3 (three) times daily with meals. as needed for blood sugar management (sliding scale)    [provider]  insulin degludec (TRESIBA) 100 UNIT/ML SOPN FlexTouch Pen Inject 0.6 mLs (60 Units total) into the skin 2 (two) times daily. Reports taking 24 units QAM Patient taking differently: Inject 20 Units into the skin daily.  03/06/18   Charlynne Cousins, MD  loperamide (IMODIUM) 2 MG capsule Take 2 mg by mouth 4 (four) times daily as needed for diarrhea or loose stools.     [provider]  Methoxy PEG-Epoetin Beta (MIRCERA IJ) Mircera 07/23/19 07/21/20  [provider]  metoCLOPramide (REGLAN) 10 MG tablet Take 1 tablet (10 mg total) by mouth 3 (three) times daily before meals. 06/22/19   Dessa Phi, DO  metoprolol tartrate (LOPRESSOR) 25 MG tablet Take 1 tablet (25 mg total) by mouth 2 (two) times daily. Patient taking differently: Take 25 mg by mouth daily.  02/07/19 06/19/19  Wyvonnia Dusky, MD  ondansetron (ZOFRAN ODT) 4 MG disintegrating tablet Take 1 tablet (4 mg total) by mouth every 8 (eight) hours as needed for nausea or vomiting. 06/22/19   Dessa Phi, DO  oxyCODONE-acetaminophen (PERCOCET/ROXICET) 5-325 MG tablet  07/19/19   [provider]  pantoprazole (PROTONIX) 40 MG tablet Take 40 mg by mouth See admin instructions. Once to twice daily    [provider]  predniSONE (DELTASONE) 20 MG tablet Take 60 mg by mouth daily. 07/11/19   [provider]  simvastatin (ZOCOR) 20 MG tablet Take 20 mg by mouth at bedtime.     [provider]  amLODipine (NORVASC) 10 MG tablet Take 1 tablet (10 mg total) by mouth daily. 03/25/18 02/07/19  Thurnell Lose, MD   Current Facility-Administered Medications  Medication Dose Route Frequency Provider Last Rate Last Admin  . acetaminophen (TYLENOL) tablet 650 mg  650 mg Oral Q6H PRN Shela Leff, MD       Or  . acetaminophen (TYLENOL) suppository 650 mg  650 mg Rectal Q6H PRN Shela Leff, MD      . hydrALAZINE (APRESOLINE) injection 5 mg  5 mg Intravenous Q4H PRN Shela Leff, MD      . insulin aspart (novoLOG) injection 0-6 Units  0-6 Units Subcutaneous Q4H Shela Leff, MD   6 Units at 10/11/19 0746  . LORazepam (ATIVAN) injection 1 mg  1 mg Intravenous Q6H PRN Shela Leff, MD      . pantoprazole (PROTONIX) injection 40 mg  40 mg Intravenous Q12H Shela Leff, MD      . scopolamine (TRANSDERM-SCOP) 1 MG/3DAYS 1.5 mg  1 patch Transdermal Q72H Shela Leff, MD   1.5 mg at 10/11/19 4920   Current Outpatient Medications  Medication Sig Dispense Refill  . ACCU-CHEK SOFTCLIX LANCETS lancets Use to check blood sugar 4 times per day. 150 each 5  . acetaminophen (TYLENOL) 500 MG tablet Take 1,000 mg by mouth every 6 (six) hours as needed for moderate pain (pain).     Marland Kitchen b complex-vitamin c-folic acid (NEPHRO-VITE) 0.8 MG TABS tablet Take 1 tablet by mouth every Monday, Wednesday, and Friday. After dialysis on dialysis days    . calcium acetate (PHOSLO) 667 MG capsule Take 1,334 mg by mouth 2 (two) times daily. Taking 1 capsule with a snack    . Calcium Carbonate Antacid (CALCIUM CARBONATE, DOSED IN MG ELEMENTAL CALCIUM,) 1250 MG/5ML SUSP Take 5 mLs by mouth 3 (three) times daily.     . cetirizine (ZYRTEC) 10 MG tablet Take 10 mg by mouth daily as needed for allergies.    . D 1000 25 MCG (1000 UT) capsule Take 1,000 Units by mouth every other day.     . diclofenac Sodium (VOLTAREN) 1 % GEL Apply 2 g topically 3 (three) times daily as needed (pain).      Marland Kitchen dicyclomine (BENTYL) 20 MG tablet Take 1 tablet (20 mg total) by mouth 2 (two) times daily as needed (stomach issues.). 60 tablet 0  . fluticasone (FLONASE) 50 MCG/ACT nasal spray Place 2 sprays into both nostrils daily as needed for allergies.    Marland Kitchen  gabapentin (NEURONTIN) 400 MG capsule Take 400 mg by mouth 3 (three) times daily.    . hydrOXYzine (ATARAX/VISTARIL) 50 MG tablet Take 1 tablet (50 mg total) by mouth 2 (two) times daily as needed for itching. 30 tablet 0  . insulin aspart (NOVOLOG FLEXPEN) 100 UNIT/ML FlexPen Inject 2-10 Units into the skin 3 (three) times daily with meals. as needed for blood sugar management (sliding scale)    . insulin degludec (TRESIBA) 100 UNIT/ML SOPN FlexTouch Pen Inject 0.6 mLs (60 Units total) into the skin 2 (two) times daily. Reports taking 24 units QAM (Patient taking differently: Inject 20 Units into the skin daily. ) 4 pen 3  . loperamide (IMODIUM) 2 MG capsule Take 2 mg by mouth 4 (four) times daily as needed for diarrhea or loose stools.     . Methoxy PEG-Epoetin Beta (MIRCERA IJ) Mircera    . metoCLOPramide (REGLAN) 10 MG tablet Take 1 tablet (10 mg total) by mouth 3 (three) times daily before meals. 90 tablet 0  . metoprolol tartrate (LOPRESSOR) 25 MG tablet Take 1 tablet (25 mg total) by mouth 2 (two) times daily. (Patient taking differently: Take 25 mg by mouth daily. ) 180 tablet 0  . ondansetron (ZOFRAN ODT) 4 MG disintegrating tablet Take 1 tablet (4 mg total) by mouth every 8 (eight) hours as needed for nausea or vomiting. 20 tablet 0  . oxyCODONE-acetaminophen (PERCOCET/ROXICET) 5-325 MG tablet     . pantoprazole (PROTONIX) 40 MG tablet Take 40 mg by mouth See admin instructions. Once to twice daily    . predniSONE (DELTASONE) 20 MG tablet Take 60 mg by mouth daily.    . simvastatin (ZOCOR) 20 MG tablet Take 20 mg by mouth at bedtime.      Labs: Basic Metabolic Panel: Recent Labs  Lab 10/10/19 1215 10/11/19 0148 10/11/19 0450  NA 136  137  --   K 4.3 6.8* 7.0*  CL 96* 89*  --   CO2 24 26  --   GLUCOSE 271* 386*  --   BUN 22* 41*  --   CREATININE 5.26* 7.49*  --   CALCIUM 10.6* 9.9  --    Liver Function Tests: Recent Labs  Lab 10/10/19 1215 10/11/19 0148  AST 21 25  ALT 18 21  ALKPHOS 72 71  BILITOT 0.9 0.8  PROT 8.9* 9.9*  ALBUMIN 4.2 4.7   Recent Labs  Lab 10/10/19 1215 10/11/19 0148  LIPASE 73* 64*   CBC: Recent Labs  Lab 10/10/19 1215 10/11/19 0148  WBC 6.2 4.5  HGB 11.5* 16.9*  HCT 36.2 50.9*  MCV 96.5 94.4  PLT 145* 115*  CBG: Recent Labs  Lab 10/11/19 0529 10/11/19 0534 10/11/19 0733  GLUCAP 370* 380* 414*   Studies/Results: CT Renal Stone Study  Result Date: 10/11/2019 CLINICAL DATA:  Flank pain EXAM: CT ABDOMEN AND PELVIS WITHOUT CONTRAST TECHNIQUE: Multidetector CT imaging of the abdomen and pelvis was performed following the standard protocol without IV contrast. COMPARISON:  June 19, 2019 FINDINGS: Lower chest: The visualized heart size within normal limits. No pericardial fluid/thickening. There is a small hiatal hernia present with reflux in the distal esophagus. The visualized portions of the lungs are clear. Hepatobiliary: Although limited due to the lack of intravenous contrast, normal in appearance without gross focal abnormality. The patient is status post cholecystectomy. No biliary ductal dilation. Pancreas:  Unremarkable.  No surrounding inflammatory changes. Spleen: Normal in size. Although limited due to the lack of intravenous contrast, normal  in appearance. Adrenals/Urinary Tract: Both adrenal glands appear normal. 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 There is stable atrophy of the left kidney. No hydronephrosis is noted. The bladder is decompressed. Stomach/Bowel: The stomach, small bowel, and colon are normal in appearance. No inflammatory changes or obstructive findings. Postsurgical changes are seen within the right lower quadrant. Vascular/Lymphatic: There are no  enlarged abdominal or pelvic lymph nodes. No significant gross vascular findings are present. Reproductive: The patient is status post hysterectomy. No adnexal masses or collections seen. Other: No evidence of abdominal wall mass or hernia. Musculoskeletal: No acute or significant osseous findings. IMPRESSION: Small hiatal hernia with reflux in the distal esophagus. No other acute intra-abdominal or pelvic pathology to explain the patient's symptoms. Electronically Signed   By: Prudencio Pair M.D.   On: 10/11/2019 02:18    ROS: All others negative except those listed in HPI.  Physical Exam: Vitals:   10/11/19 0719 10/11/19 0719 10/11/19 0724 10/11/19 0730  BP: (!) 152/120   (!) 165/123  Pulse:   (!) 125 (!) 128  Resp:   16 16  Temp:  99.2 F (37.3 C)    TempSrc:  Oral    SpO2: 98%  96% 99%  Weight:      Height:         General: WDWN, NAD, well appearing female Head: NCAT sclera not icteric MMM Neck: Supple. No lymphadenopathy. No JVD Lungs: CTA bilaterally. No wheeze, rales or rhonchi. Breathing is unlabored. Heart: RRR. No murmur, rubs or gallops.  Abdomen: soft, +diffuse tenderness, +BS, + guarding Lower extremities:no edema, ischemic changes, or open wounds  Neuro: AAOx3. Moves all extremities spontaneously. Psych:  Responds to questions appropriately with a normal affect. Dialysis Access: LU AVF +b  Dialysis Orders:  MWF [facility] [place]  4hrs, BFR 350, DFR 800,  EDW 83kg, 2K/ 3.5Ca P4  Access: LU AVF  Heparin 4000 unit bolus, 2000 intermittent Mircera 50 mcg q2wks - last 7/26  Assessment/Plan: 1.  Hyperkalemia - K 7.0.  Emergent HD this AM.  Discussed following low K diet.  2. Abdominal pain/n/v - CT abd negative for acute process. Hx gastroparesis. Per primary 3.  ESRD -  On HD MWF.  Emergent HD for ^K today.  Plan for HD tomorrow per regular schedule. Can be completed at OP if patient discharge prior to OP appointment. Will write orders for here in case remains admitted.  4.   Hypertension/volume  - BP elevated.  Continue home meds.  Does not appear volume overloaded. UF as tolerated. 5.  Anemia of CKD - Hgb 10.9.  ESA recently dosed. 6.  Secondary Hyperparathyroidism -  Ca in goal. Will check phos. Not on VDRA.  Continue binders.  7.  Nutrition - Renal diet w/fluid restrictions. Vit 8. DM - per primary  Jen Mow, PA-C [facility] 10/11/2019, 8:01 AM

## 2019-10-11 NOTE — ED Notes (Signed)
Attempted report to inpatient unit. Callback number given to unit Network engineer.

## 2019-10-11 NOTE — ED Notes (Signed)
Called hemodialysis, ready to receive patient for treatment.

## 2019-10-11 NOTE — Progress Notes (Signed)
PROGRESS NOTE    [name]  FOY:[id number] DOB: [birth year] DOA: 10/11/2019 PCP: [name], MD   Brief Narrative:  [name] is a 34 y.o. female with medical history significant of ESRD on HD MWF, history of renal transplant, anemia of ESRD, type 1 diabetes, hypertension, hyperlipidemia, GERD, gastroparesis, XXX syndrome presenting with complaints of abdominal pain and vomiting.  Patient states she was not able to tolerate her entire session of dialysis yesterday due to abdominal pain.  Reports generalized abdominal pain.  She has continued to vomit since then and not able to tolerate p.o. intake.  Reports vomiting blood.  Denies cough, shortness of breath, or chest pain.  Denies marijuana or any other drug use.  Denies alcohol use.  No additional history could be obtained from her. In ED: patient found to be afebrile. Tachycardic on arrival with heart rate in the 120s.  Blood pressure elevated with systolic up to 767M and diastolic up to 094B. Labs showing no leukocytosis.  Hemoglobin 16.9.  Platelet count 115. Potassium 6.8 on initial labs. Repeat labs confirmed hyperkalemia with potassium 7.0.  Chloride 89. Blood glucose 386.  Bicarb 26.  No significant elevation of lipase or LFTs.  Beta hCG negative.  SARS-CoV-2 PCR test pending. CT showing small hiatal hernia with reflux in the distal esophagus.  No other acute intra-abdominal pathology. EKG with QT prolongation and peaked T waves. Patient was given medical treatment for hyperkalemia including albuterol, insulin, Lokelma, sodium bicarb, and calcium gluconate.  She was also given Reglan and Protonix - transferred to [facility] for urgent dialysis the am of 10/11/19   Assessment & Plan:   Principal Problem:   Hyperkalemia Active Problems:   Diabetic gastroparesis ([place])   ESRD on hemodialysis ([place])   Hypertensive urgency   Hyperglycemia  Hyperkalemia, ESRD -Potassium 7.0.  EKG with peaked T waves. -Defer to nephrology for dialysis  for electrolyte management -Patient received hyperkalemic protocol with albuterol, insulin, Lokelma, sodium bicarb, calcium gluconate.  -Follow repeat BMP  Intractable nausea, vomiting, and abdominal pain -suspect secondary to diabetic gastroparesis Hematemesis -Nausea vomiting improving but not yet resolved,  -Patient remains afebrile without leukocytosis, less likely infectious process  -CT unremarkable for acute processes -Currently n.p.o., Protonix twice daily; continue supportive care for nausea/vomiting  Hypertensive urgency, resolved -Blood pressure markedly elevated with systolic up to 096G and diastolic up to 836O -Improving status post dialysis, continue hydralazine as needed -Resume home medications once verified  Hyperglycemia, type 1 diabetes -Likely in the setting of medication noncompliance -Continue sliding scale insulin very sensitive every 4 hours -Restarted long-acting insulin 20 units once daily, verify home dose with pharmacy given 2 doses, 20u once daily versus 60u twice daily.    QT prolongation on EKG -Cardiac monitoring.  Monitor potassium and magnesium levels.  Avoid QT prolonging drugs if possible. -Likely to resolve with electrolyte correction, follow repeat EKG  DVT prophylaxis: SCDs Code Status: Full Family Communication: None present  Status is: Inpatient  Dispo: The patient is from: Home              Anticipated d/c is to: Home              Anticipated d/c date is: 24 to 48 hours              Patient currently not medically stable for discharge but ongoing need for IV fluids, IV medications poor p.o. intake intractable nausea vomiting and need for close monitoring with telemetry given profound  electrolyte abnormalities and EKG changes at intake.  Consultants:   Nephrology  Procedures:   Dialysis 10/11/2019  Antimicrobials:  None indicated  Subjective: No acute issues or events overnight, tolerating dialysis quite well denies  shortness of breath, chest pain, headache, fevers, chills.  Objective: Vitals:   10/11/19 1357 10/11/19 1417 10/11/19 1420 10/11/19 1445  BP: (!) 186/115 (!) 201/116 (!) 187/109 113/80  Pulse:   (!) 118 (!) 119  Resp:   15 15  Temp:      TempSrc:      SpO2:   95% 95%  Weight:      Height:        Intake/Output Summary (Last 24 hours) at 10/11/2019 1536 Last data filed at 10/11/2019 1238 Gross per 24 hour  Intake --  Output 20 ml  Net -20 ml   Filed Weights   10/10/19 2257 10/11/19 0825 10/11/19 1238  Weight: 72.6 kg 81.4 kg 81.4 kg    Examination:  General exam: Appears calm and comfortable  Respiratory system: Clear to auscultation. Respiratory effort normal. Cardiovascular system: S1 & S2 heard, RRR. No JVD, murmurs, rubs, gallops or clicks. No pedal edema. Gastrointestinal system: Abdomen is nondistended, soft and nontender. No organomegaly or masses felt. Normal bowel sounds heard. Central nervous system: Alert and oriented. No focal neurological deficits. Extremities: Symmetric 5 x 5 power. Skin: No rashes, lesions or ulcers Psychiatry: Judgement and insight appear normal. Mood & affect appropriate.     Data Reviewed: I have personally reviewed following labs and imaging studies  CBC: Recent Labs  Lab 10/10/19 1215 10/11/19 0148 10/11/19 0740  WBC 6.2 4.5 9.8  HGB 11.5* 16.9* 10.9*  HCT 36.2 50.9* 33.5*  MCV 96.5 94.4 95.2  PLT 145* 115* 102   Basic Metabolic Panel: Recent Labs  Lab 10/10/19 1215 10/11/19 0148 10/11/19 0450 10/11/19 0910  NA 136 137  --  138  K 4.3 6.8* 7.0* 3.8  CL 96* 89*  --  95*  CO2 24 26  --  27  GLUCOSE 271* 386*  --  220*  BUN 22* 41*  --  24*  CREATININE 5.26* 7.49*  --  4.73*  CALCIUM 10.6* 9.9  --  10.9*  MG  --   --   --  2.2   GFR: Estimated Creatinine Clearance: 17.7 mL/min (A) (by C-G formula based on SCr of 4.73 mg/dL (H)). Liver Function Tests: Recent Labs  Lab 10/10/19 1215 10/11/19 0148  AST 21 25   ALT 18 21  ALKPHOS 72 71  BILITOT 0.9 0.8  PROT 8.9* 9.9*  ALBUMIN 4.2 4.7   Recent Labs  Lab 10/10/19 1215 10/11/19 0148  LIPASE 73* 64*   No results for input(s): AMMONIA in the last 168 hours. Coagulation Profile: No results for input(s): INR, PROTIME in the last 168 hours. Cardiac Enzymes: No results for input(s): CKTOTAL, CKMB, CKMBINDEX, TROPONINI in the last 168 hours. BNP (last 3 results) No results for input(s): PROBNP in the last 8760 hours. HbA1C: No results for input(s): HGBA1C in the last 72 hours. CBG: Recent Labs  Lab 10/11/19 0529 10/11/19 0534 10/11/19 0733 10/11/19 1403  GLUCAP 370* 380* 414* 165*   Lipid Profile: No results for input(s): CHOL, HDL, LDLCALC, TRIG, CHOLHDL, LDLDIRECT in the last 72 hours. Thyroid Function Tests: No results for input(s): TSH, T4TOTAL, FREET4, T3FREE, THYROIDAB in the last 72 hours. Anemia Panel: No results for input(s): VITAMINB12, FOLATE, FERRITIN, TIBC, IRON, RETICCTPCT in the last 72 hours. Sepsis Labs:  No results for input(s): PROCALCITON, LATICACIDVEN in the last 168 hours.  Recent Results (from the past 240 hour(s))  SARS Coronavirus 2 by RT PCR (hospital order, performed in [facility] hospital lab) Nasopharyngeal Nasopharyngeal Swab     Status: None   Collection Time: 10/11/19  6:19 AM   Specimen: Nasopharyngeal Swab  Result Value Ref Range Status   SARS Coronavirus 2 NEGATIVE NEGATIVE Final    Comment: (NOTE) SARS-CoV-2 target nucleic acids are NOT DETECTED.  The SARS-CoV-2 RNA is generally detectable in upper and lower respiratory specimens during the acute phase of infection. The lowest concentration of SARS-CoV-2 viral copies this assay can detect is 250 copies / mL. A negative result does not preclude SARS-CoV-2 infection and should not be used as the sole basis for treatment or other patient management decisions.  A negative result may occur with improper specimen collection / handling, submission  of specimen other than nasopharyngeal swab, presence of viral mutation(s) within the areas targeted by this assay, and inadequate number of viral copies (<250 copies / mL). A negative result must be combined with clinical observations, patient history, and epidemiological information.  Fact Sheet for Patients:   StrictlyIdeas.no  Fact Sheet for Healthcare Providers: BankingDealers.co.za  This test is not yet approved or  cleared by the [place] FDA and has been authorized for detection and/or diagnosis of SARS-CoV-2 by FDA under an Emergency Use Authorization (EUA).  This EUA will remain in effect (meaning this test can be used) for the duration of the COVID-19 declaration under Section 564(b)(1) of the Act, 21 U.S.C. section 360bbb-3(b)(1), unless the authorization is terminated or revoked sooner.  Performed at [facility], [place] [address], [place], [place] [address]          Radiology Studies: CT Renal Stone Study  Result Date: 10/11/2019 CLINICAL DATA:  Flank pain EXAM: CT ABDOMEN AND PELVIS WITHOUT CONTRAST TECHNIQUE: Multidetector CT imaging of the abdomen and pelvis was performed following the standard protocol without IV contrast. COMPARISON:  June 19, 2019 FINDINGS: Lower chest: The visualized heart size within normal limits. No pericardial fluid/thickening. There is a small hiatal hernia present with reflux in the distal esophagus. The visualized portions of the lungs are clear. Hepatobiliary: Although limited due to the lack of intravenous contrast, normal in appearance without gross focal abnormality. The patient is status post cholecystectomy. No biliary ductal dilation. Pancreas:  Unremarkable.  No surrounding inflammatory changes. Spleen: Normal in size. Although limited due to the lack of intravenous contrast, normal in appearance. Adrenals/Urinary Tract: Both adrenal glands appear normal. 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 There is stable atrophy of the left kidney. No hydronephrosis is noted. The bladder is decompressed. Stomach/Bowel: The stomach, small bowel, and colon are normal in appearance. No inflammatory changes or obstructive findings. Postsurgical changes are seen within the right lower quadrant. Vascular/Lymphatic: There are no enlarged abdominal or pelvic lymph nodes. No significant gross vascular findings are present. Reproductive: The patient is status post hysterectomy. No adnexal masses or collections seen. Other: No evidence of abdominal wall mass or hernia. Musculoskeletal: No acute or significant osseous findings. IMPRESSION: Small hiatal hernia with reflux in the distal esophagus. No other acute intra-abdominal or pelvic pathology to explain the patient's symptoms. Electronically Signed   By: Prudencio Pair M.D.   On: 10/11/2019 02:18        Scheduled Meds: . insulin aspart  0-6 Units Subcutaneous Q4H  . pantoprazole (PROTONIX) IV  40 mg  Intravenous Q12H   Continuous Infusions: . sodium chloride    . sodium chloride       LOS: 0 days    Time spent: 64min  Yisel Megill C Calle Schader, DO [facility]   10/11/2019, 3:36 PM

## 2019-10-11 NOTE — Procedures (Signed)
I was present at this dialysis session. I have reviewed the session itself and made appropriate changes.  D/c'ed scopalamine patch (worsening tachycardia), BP stable. 4hrs, uf goal 3L, bfr 350, lue avf, hr 136bpm, bp 110/65  Filed Weights   10/10/19 2257 10/11/19 0825  Weight: 72.6 kg 81.4 kg    Recent Labs  Lab 10/11/19 0910  NA 138  K 3.8  CL 95*  CO2 27  GLUCOSE 220*  BUN 24*  CREATININE 4.73*  CALCIUM 10.9*    Recent Labs  Lab 10/10/19 1215 10/11/19 0148 10/11/19 0740  WBC 6.2 4.5 9.8  HGB 11.5* 16.9* 10.9*  HCT 36.2 50.9* 33.5*  MCV 96.5 94.4 95.2  PLT 145* 115* 181    Scheduled Meds: . acetaminophen      . insulin aspart  0-6 Units Subcutaneous Q4H  . pantoprazole (PROTONIX) IV  40 mg Intravenous Q12H   Continuous Infusions: . sodium chloride    . sodium chloride     PRN Meds:.sodium chloride, sodium chloride, acetaminophen **OR** acetaminophen, heparin, hydrALAZINE, lidocaine (PF), lidocaine-prilocaine, LORazepam, pentafluoroprop-tetrafluoroeth   Gean Quint, MD [facility] 10/11/2019, 12:15 PM

## 2019-10-11 NOTE — Significant Event (Signed)
HOSPITAL MEDICINE OVERNIGHT EVENT NOTE (10/11/2019 7:31PM)  Called by nursing due to patient complaining of continued severe periumbillical abdominal pain, similar to her pain that she presented to the ED with.  Chart reviewed.  Patient thought to be suffering from gastroparesis as cause of abdominal symptoms.  Lipase minimally elevated with unremarkable LFT's no acute disease seen on CT abd/pelvis on admission. Abdomen has been soft and not acute per admitting provider exam.  Patient is typically on Percocet in the outpatient setting per med rec.  Will place patient on PRN percocet for moderate pain (per home regimen) and 0.5mg  IV Dilaudid for severe pain.    [name]

## 2019-10-11 NOTE — ED Notes (Addendum)
Pt to ED via [facility], transfer from [facility], pt to be inpatient and receive dialysis here today at some point. Pt received partial treatment of dialysis yesterday, became sick at treatment.

## 2019-10-11 NOTE — ED Provider Notes (Addendum)
[facility] DEPT Provider Note   CSN: [id number] Arrival date & time: 10/10/19  2247     History No chief complaint on file.   [name] is a 34 y.o. female.  The history is provided by the patient.  Emesis Severity:  Moderate Duration:  1 day Timing:  Intermittent Quality:  Stomach contents Progression:  Unchanged Chronicity:  Recurrent Recent urination:  Absent Context: not post-tussive   Relieved by:  Nothing Worsened by:  Nothing Ineffective treatments:  None tried Associated symptoms: no cough, no diarrhea, no fever, no headaches and no myalgias   Associated symptoms comment:  Patient does not have a fever, she says fever referring to her elevated BP.   Risk factors: diabetes   Risk factors: no alcohol use   Patient with ESRD on dialysis MWF with known gastroparesis presents with vomiting typical of previous episodes following dialysis earlier in the day.  No f/c/r.       Past Medical History:  Diagnosis Date  . Anemia   . Chronic kidney disease    kidney transplant 07  . Diabetes mellitus    Pt reports diagnosis in June 2011, Type 2  . GERD (gastroesophageal reflux disease)   . Hyperlipidemia   . Hypertension   . 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 Pyelonephritis 06/23/2014  . Seasonal allergies   . UTI (urinary tract infection) 01/09/2015  . 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 Intractable abdominal pain 06/19/2019  . Sinus tachycardia 06/19/2019  . Intra-abdominal abscess ([place]) 10/28/2018  . Hypertensive emergency 09/01/2018  . DKA (diabetic ketoacidosis) ([place]) 05/01/2018  . ESRD on hemodialysis ([place]) 05/01/2018  . Renal failure 03/18/2018  . Cellulitis of left leg 03/01/2018  .  Hyperlipidemia 03/01/2018  . Hypocalcemia 03/01/2018  . Metabolic acidosis 96/75/9163  . Hypokalemia 02/03/2018  . Incontinence of bowel 02/03/2018  . Chronic pain 11/11/2017  . Gastroparesis   . Constipation 07/13/2017  . Hematemesis 07/02/2017  . Dehydration 07/02/2017  . Chronic cholecystitis 06/29/2017  . Type 1 diabetes mellitus with complication, uncontrolled ([place]) 05/25/2015  . Nausea & vomiting 01/09/2015  . Renal transplant recipient   . Immunosuppressed status ([place])   . 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 Chronic kidney disease 01/04/2010  . Chronic kidney disease, stage II (mild) 01/04/2010  . OVARIAN FAILURE, PREMATURE 03/11/2009  . 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 OBESITY 09/25/2007  . Anemia due to chronic kidney disease 09/25/2007  . LEARNING DISABILITY 09/25/2007  . Essential hypertension, benign 09/25/2007    Past Surgical History:  Procedure Laterality Date  . ARTERIOVENOUS GRAFT PLACEMENT Bilateral    "neither work" (10/24/2017)  . AV FISTULA PLACEMENT Left 10/26/2018   Procedure: CREATION OF ARTERIOVENOUS FISTULA  LEFT ARM;  Surgeon: [name], MD;  Location: [facility];  Service: Vascular;  Laterality: Left;  . BASCILIC VEIN TRANSPOSITION Left 12/21/2018   Procedure: Left arm BASILIC VEIN TRANSPOSITION SECOND STAGE;  Surgeon: [name], MD;  Location: [facility];  Service: Vascular;  Laterality: Left;  . CHOLECYSTECTOMY N/A 06/30/2017   Procedure: LAPAROSCOPIC CHOLECYSTECTOMY WITH INTRAOPERATIVE CHOLANGIOGRAM;  Surgeon: Excell Seltzer, MD;  Location: [facility];  Service: General;  Laterality: N/A;  . ESOPHAGOGASTRODUODENOSCOPY (EGD) WITH PROPOFOL N/A 07/04/2017   Procedure: ESOPHAGOGASTRODUODENOSCOPY (EGD) WITH PROPOFOL;  Surgeon: Clarene Essex, MD;  Location: [facility];  Service: Endoscopy;  Laterality: N/A;  . INSERTION OF DIALYSIS  CATHETER N/A 03/20/2018   Procedure: INSERTION OF TUNNELED DIALYSIS CATHETER - RIGHT INTERANL JUGULAR PLACEMENT;  Surgeon: Angelia Mould, MD;  Location: [facility];  Service: Vascular;  Laterality: N/A;  . IR GUIDED [place]  10/28/2018  . KIDNEY TRANSPLANT  2007  . PARATHYROIDECTOMY  ?2012   "3/4 removed" (10/24/2017)  . RENAL BIOPSY Bilateral 2003  . UPPER EXTREMITY VENOGRAPHY Bilateral 10/19/2018   Procedure: UPPER EXTREMITY VENOGRAPHY;  Surgeon: [name], MD;  Location: [facility];  Service: Cardiovascular;  Laterality: Bilateral;  Bilateral      OB History   No obstetric history on file.     Family History  Problem Relation Age of Onset  . Arthritis Mother   . Hypertension Mother   . Aneurysm Mother        died of brain aneurysm  . CAD Father        Has 3 stents  . Diabetes Father        borderline  . Early death Brother        Died in war    Social History   Tobacco Use  . Smoking status: Never Smoker  . Smokeless tobacco: Never Used  Vaping Use  . Vaping Use: Never used  Substance Use Topics  . Alcohol use: No  . Drug use: No    Home Medications Prior to Admission medications   Medication Sig Start Date End Date Taking? Authorizing Provider  ACCU-CHEK SOFTCLIX LANCETS lancets Use to check blood sugar 4 times per day. 12/29/15   Renato Shin, MD  acetaminophen (TYLENOL) 500 MG tablet Take 1,000 mg by mouth every 6 (six) hours as needed for moderate pain (pain).     [provider]  b complex-vitamin c-folic acid (NEPHRO-VITE) 0.8 MG TABS tablet Take 1 tablet by mouth every Monday, Wednesday, and Friday. After dialysis on dialysis days 07/19/18   [provider]  calcium acetate (PHOSLO) 667 MG capsule Take 1,334 mg by mouth 2 (two) times daily. Taking 1 capsule with a snack 11/24/18   [provider]  Calcium Carbonate Antacid (CALCIUM CARBONATE, DOSED IN MG ELEMENTAL CALCIUM,) 1250 MG/5ML SUSP Take 5  mLs by mouth 3 (three) times daily.  11/17/18   [provider]  cetirizine (ZYRTEC) 10 MG tablet Take 10 mg by mouth daily as needed for allergies.    [provider]  D 1000 25 MCG (1000 UT) capsule Take 1,000 Units by mouth every other day.  04/27/19   [provider]  diclofenac Sodium (VOLTAREN) 1 % GEL Apply 2 g topically 3 (three) times daily as needed (pain).  04/19/19   [provider]  dicyclomine (BENTYL) 20 MG tablet Take 1 tablet (20 mg total) by mouth 2 (two) times daily as needed (stomach issues.). 06/22/19   Dessa Phi, DO  fluticasone (FLONASE) 50 MCG/ACT nasal spray Place 2 sprays into both nostrils daily as needed for allergies. 09/03/18   [name], MD  gabapentin (NEURONTIN) 400 MG capsule Take 400 mg by mouth 3 (three) times daily. 04/29/19   [provider]  hydrOXYzine (ATARAX/VISTARIL) 50 MG tablet Take 1 tablet (50 mg total) by mouth 2 (two) times daily as needed for  itching. 02/06/18   [name], Ripudeep K, MD  insulin aspart (NOVOLOG FLEXPEN) 100 UNIT/ML FlexPen Inject 2-10 Units into the skin 3 (three) times daily with meals. as needed for blood sugar management (sliding scale)    [provider]  insulin degludec (TRESIBA) 100 UNIT/ML SOPN FlexTouch Pen Inject 0.6 mLs (60 Units total) into the skin 2 (two) times daily. Reports taking 24 units QAM Patient taking differently: Inject 20 Units into the skin daily.  03/06/18   Charlynne Cousins, MD  loperamide (IMODIUM) 2 MG capsule Take 2 mg by mouth 4 (four) times daily as needed for diarrhea or loose stools.     [provider]  Methoxy PEG-Epoetin Beta (MIRCERA IJ) Mircera 07/23/19 07/21/20  [provider]  metoCLOPramide (REGLAN) 10 MG tablet Take 1 tablet (10 mg total) by mouth 3 (three) times daily before meals. 06/22/19   Dessa Phi, DO  metoprolol tartrate (LOPRESSOR) 25 MG tablet Take 1 tablet (25 mg total) by mouth 2 (two) times daily. Patient  taking differently: Take 25 mg by mouth daily.  02/07/19 06/19/19  Wyvonnia Dusky, MD  ondansetron (ZOFRAN ODT) 4 MG disintegrating tablet Take 1 tablet (4 mg total) by mouth every 8 (eight) hours as needed for nausea or vomiting. 06/22/19   Dessa Phi, DO  oxyCODONE-acetaminophen (PERCOCET/ROXICET) 5-325 MG tablet  07/19/19   [provider]  pantoprazole (PROTONIX) 40 MG tablet Take 40 mg by mouth See admin instructions. Once to twice daily    [provider]  predniSONE (DELTASONE) 20 MG tablet Take 60 mg by mouth daily. 07/11/19   [provider]  simvastatin (ZOCOR) 20 MG tablet Take 20 mg by mouth at bedtime.     [provider]  amLODipine (NORVASC) 10 MG tablet Take 1 tablet (10 mg total) by mouth daily. 03/25/18 02/07/19  Thurnell Lose, MD    Allergies    Benadryl [diphenhydramine-zinc acetate], Diphenhydramine hcl, Doxycycline, Motrin [ibuprofen], Banana, Iron dextran, Shellfish allergy, and Chlorhexidine  Review of Systems   Review of Systems  Constitutional: Negative for fever.  HENT: Negative for congestion.   Eyes: Negative for visual disturbance.  Respiratory: Negative for cough.   Cardiovascular: Negative for chest pain.  Gastrointestinal: Positive for nausea and vomiting. Negative for diarrhea.  Genitourinary: Negative for difficulty urinating.  Musculoskeletal: Negative for myalgias.  Skin: Negative for color change.  Neurological: Negative for headaches.  Psychiatric/Behavioral: Negative for agitation.  All other systems reviewed and are negative.   Physical Exam Updated Vital Signs BP (!) 149/115 (BP Location: Right Arm)   Pulse (!) 123   Temp 98.4 F (36.9 C) (Oral)   Resp 18   Ht 5\' 6"  (1.676 m)   Wt 72.6 kg   SpO2 98%   BMI 25.82 kg/m   Physical Exam Vitals and nursing note reviewed.  Constitutional:      General: She is not in acute distress.    Appearance: Normal appearance.  HENT:     Head: Normocephalic  and atraumatic.     Nose: Nose normal.  Eyes:     Conjunctiva/sclera: Conjunctivae normal.     Pupils: Pupils are equal, round, and reactive to light.  Cardiovascular:     Rate and Rhythm: Normal rate and regular rhythm.     Pulses: Normal pulses.     Heart sounds: Normal heart sounds.  Pulmonary:     Effort: Pulmonary effort is normal.     Breath sounds: Normal breath sounds.  Abdominal:  General: Abdomen is flat. Bowel sounds are normal.     Tenderness: There is no abdominal tenderness. There is no guarding or rebound.  Musculoskeletal:        General: Normal range of motion.     Cervical back: Normal range of motion and neck supple.  Skin:    General: Skin is warm and dry.     Capillary Refill: Capillary refill takes less than 2 seconds.  Neurological:     General: No focal deficit present.     Mental Status: She is alert and oriented to person, place, and time.     Deep Tendon Reflexes: Reflexes normal.  Psychiatric:        Mood and Affect: Mood normal.        Behavior: Behavior normal.     ED Results / Procedures / Treatments   Labs (all labs ordered are listed, but only abnormal results are displayed) Results for orders placed or performed during the hospital encounter of 10/11/19  Lipase, blood  Result Value Ref Range   Lipase 64 (H) 11 - 51 U/L  Comprehensive metabolic panel  Result Value Ref Range   Sodium 137 135 - 145 mmol/L   Potassium 6.8 (HH) 3.5 - 5.1 mmol/L   Chloride 89 (L) 98 - 111 mmol/L   CO2 26 22 - 32 mmol/L   Glucose, Bld 386 (H) 70 - 99 mg/dL   BUN 41 (H) 6 - 20 mg/dL   Creatinine, Ser 7.49 (H) 0.44 - 1.00 mg/dL   Calcium 9.9 8.9 - 10.3 mg/dL   Total Protein 9.9 (H) 6.5 - 8.1 g/dL   Albumin 4.7 3.5 - 5.0 g/dL   AST 25 15 - 41 U/L   ALT 21 0 - 44 U/L   Alkaline Phosphatase 71 38 - 126 U/L   Total Bilirubin 0.8 0.3 - 1.2 mg/dL   GFR calc non Af Amer 6 (L) >60 mL/min   GFR calc Af Amer 7 (L) >60 mL/min   Anion gap 22 (H) 5 - 15  CBC    Result Value Ref Range   WBC 4.5 4.0 - 10.5 K/uL   RBC 5.39 (H) 3.87 - 5.11 MIL/uL   Hemoglobin 16.9 (H) 12.0 - 15.0 g/dL   HCT 50.9 (H) 36 - 46 %   MCV 94.4 80.0 - 100.0 fL   MCH 31.4 26.0 - 34.0 pg   MCHC 33.2 30.0 - 36.0 g/dL   RDW 14.6 11.5 - 15.5 %   Platelets 115 (L) 150 - 400 K/uL   nRBC 0.0 0.0 - 0.2 %  Potassium  Result Value Ref Range   Potassium 7.0 (HH) 3.5 - 5.1 mmol/L  I-Stat beta hCG blood, ED  Result Value Ref Range   I-stat hCG, quantitative <5.0 <5 mIU/mL   Comment 3          CBG monitoring, ED  Result Value Ref Range   Glucose-Capillary 370 (H) 70 - 99 mg/dL   CT Renal Stone Study  Result Date: 10/11/2019 CLINICAL DATA:  Flank pain EXAM: CT ABDOMEN AND PELVIS WITHOUT CONTRAST TECHNIQUE: Multidetector CT imaging of the abdomen and pelvis was performed following the standard protocol without IV contrast. COMPARISON:  Hikeem Andersson 6, 2021 FINDINGS: Lower chest: The visualized heart size within normal limits. No pericardial fluid/thickening. There is a small hiatal hernia present with reflux in the distal esophagus. The visualized portions of the lungs are clear. Hepatobiliary: Although limited due to the lack of intravenous contrast, normal in appearance  without gross focal abnormality. The patient is status post cholecystectomy. No biliary ductal dilation. Pancreas:  Unremarkable.  No surrounding inflammatory changes. Spleen: Normal in size. Although limited due to the lack of intravenous contrast, normal in appearance. Adrenals/Urinary Tract: Both adrenal glands appear normal. 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 There is stable atrophy of the left kidney. No hydronephrosis is noted. The bladder is decompressed. Stomach/Bowel: The stomach, small bowel, and colon are normal in appearance. No inflammatory changes or obstructive findings. Postsurgical changes are seen within the right lower quadrant. Vascular/Lymphatic: There are no enlarged abdominal or pelvic lymph nodes.  No significant gross vascular findings are present. Reproductive: The patient is status post hysterectomy. No adnexal masses or collections seen. Other: No evidence of abdominal wall mass or hernia. Musculoskeletal: No acute or significant osseous findings. IMPRESSION: Small hiatal hernia with reflux in the distal esophagus. No other acute intra-abdominal or pelvic pathology to explain the patient's symptoms. Electronically Signed   By: Prudencio Pair M.D.   On: 10/11/2019 02:18    EKG See muse peaked T waves and prolonged qt  Radiology No results found.  Procedures Procedures (including critical care time)  Medications Ordered in ED Medications  calcium gluconate 1 g/ 50 mL sodium chloride IVPB (has no administration in time range)  insulin aspart (novoLOG) injection 5 Units (has no administration in time range)  sodium bicarbonate injection 50 mEq (has no administration in time range)  albuterol (VENTOLIN HFA) 108 (90 Base) MCG/ACT inhaler 10 puff (has no administration in time range)  sodium zirconium cyclosilicate (LOKELMA) packet 10 g (has no administration in time range)  sodium chloride flush (NS) 0.9 % injection 3 mL (3 mLs Intravenous Given 10/11/19 0149)  metoCLOPramide (REGLAN) injection 10 mg (10 mg Intravenous Given 10/11/19 0149)  pantoprazole (PROTONIX) injection 40 mg (40 mg Intravenous Given 10/11/19 0353)    ED Course  I have reviewed the triage vital signs and the nursing notes.  Pertinent labs & imaging results that were available during my care of the patient were reviewed by me and considered in my medical decision making (see chart for details).    Patient has a h/o cyclic vomiting.  Found to be hyperkalemic on labs, rechecked.  Hyperkalemia protocol initiated.  Nephrology and hospitalist contacted for admission.     MDM Reviewed: previous chart, nursing note and vitals Reviewed previous: labs Interpretation: labs, ECG and CT scan (hyperkalemia, elevate blood  sugar.  No acute finding on CT by me ) Total time providing critical care: 75-105 minutes (hyperkalemia protocol initiated for EKG changes ). This excludes time spent performing separately reportable procedures and services. Consults: admitting MD (nephrology )  CRITICAL CARE Performed by: Keaundra Stehle K Ronee Ranganathan-Rasch Total critical care time: 75 minutes Critical care time was exclusive of separately billable procedures and treating other patients. Critical care was necessary to treat or prevent imminent or life-threatening deterioration. Critical care was time spent personally by me on the following activities: development of treatment plan with patient and/or surrogate as well as nursing, discussions with consultants, evaluation of patient's response to treatment, examination of patient, obtaining history from patient or surrogate, ordering and performing treatments and interventions, ordering and review of laboratory studies, ordering and review of radiographic studies, pulse oximetry and re-evaluation of patient's condition.  There are currently no inpatient beds at [facility] though hospitalist has placed admit orders.    Dr. Johnney Ou: Will take to dialysis when a bed is assigned or patient must go ED to  ED  Dr. Marlowe Sax to do admission for cyclic vomiting and hyperkalemia  I have also discussed the case with Dr. Betsey Holiday. Patient will go ED to ED in order to get emergent dialysis.    carelink is presents  [name] was evaluated in Emergency Department on 10/11/2019 for the symptoms described in the history of present illness. She was evaluated in the context of the global COVID-19 pandemic, which necessitated consideration that the patient might be at risk for infection with the SARS-CoV-2 virus that causes COVID-19. Institutional protocols and algorithms that pertain to the evaluation of patients at risk for COVID-19 are in a state of rapid change based on information released by regulatory bodies  including the CDC and federal and state organizations. These policies and algorithms were followed during the patient's care in the ED.  Final Clinical Impression(s) / ED Diagnoses  Admit to [facility].  Will go to ED pending emergent dialysis and then to an inpatient bed        Dellis Voght, MD 10/11/19 0370

## 2019-10-12 DIAGNOSIS — E875 Hyperkalemia: Secondary | ICD-10-CM | POA: Diagnosis not present

## 2019-10-12 LAB — CBC
HCT: 32.7 % — ABNORMAL LOW (ref 36.0–46.0)
Hemoglobin: 10.3 g/dL — ABNORMAL LOW (ref 12.0–15.0)
MCH: 31 pg (ref 26.0–34.0)
MCHC: 31.5 g/dL (ref 30.0–36.0)
MCV: 98.5 fL (ref 80.0–100.0)
Platelets: 184 10*3/uL (ref 150–400)
RBC: 3.32 MIL/uL — ABNORMAL LOW (ref 3.87–5.11)
RDW: 14.8 % (ref 11.5–15.5)
WBC: 8.9 10*3/uL (ref 4.0–10.5)
nRBC: 0 % (ref 0.0–0.2)

## 2019-10-12 LAB — GLUCOSE, CAPILLARY
Glucose-Capillary: 145 mg/dL — ABNORMAL HIGH (ref 70–99)
Glucose-Capillary: 147 mg/dL — ABNORMAL HIGH (ref 70–99)
Glucose-Capillary: 167 mg/dL — ABNORMAL HIGH (ref 70–99)
Glucose-Capillary: 168 mg/dL — ABNORMAL HIGH (ref 70–99)

## 2019-10-12 MED ORDER — OXYCODONE-ACETAMINOPHEN 5-325 MG PO TABS
ORAL_TABLET | ORAL | Status: AC
  Filled 2019-10-12: qty 1

## 2019-10-12 MED ORDER — HEPARIN SODIUM (PORCINE) 1000 UNIT/ML IJ SOLN
INTRAMUSCULAR | Status: AC
  Filled 2019-10-12: qty 4

## 2019-10-12 MED ORDER — HYDROMORPHONE HCL 1 MG/ML IJ SOLN
INTRAMUSCULAR | Status: AC
  Administered 2019-10-12: 0.5 mg
  Filled 2019-10-12: qty 0.5

## 2019-10-12 MED ORDER — CARVEDILOL 12.5 MG PO TABS: 12.5000 mg | ORAL_TABLET | Freq: Two times a day (BID) | ORAL | Status: DC

## 2019-10-12 MED ORDER — RENA-VITE PO TABS: 1.0000 | ORAL_TABLET | Freq: Every day | ORAL | Status: DC

## 2019-10-12 MED ORDER — CALCIUM ACETATE (PHOS BINDER) 667 MG PO CAPS
1334.0000 mg | ORAL_CAPSULE | Freq: Three times a day (TID) | ORAL | Status: DC
  Administered 2019-10-12: 1334 mg via ORAL
  Filled 2019-10-12: qty 2

## 2019-10-12 MED ORDER — OXYCODONE-ACETAMINOPHEN 5-325 MG PO TABS
1.0000 | ORAL_TABLET | Freq: Three times a day (TID) | ORAL | Status: DC | PRN
  Administered 2019-10-12: 1 via ORAL

## 2019-10-12 MED ORDER — CARVEDILOL 12.5 MG PO TABS: 12.5000 mg | ORAL_TABLET | Freq: Two times a day (BID) | ORAL | 0 refills | Status: DC

## 2019-10-12 NOTE — Progress Notes (Signed)
DISCHARGE NOTE HOME [name] to be discharged Home per MD order. Discussed prescriptions and follow up appointments with the patient. Prescriptions given to patient; medication list explained in detail. Patient verbalized understanding.  Skin clean, dry and intact without evidence of skin break down, no evidence of skin tears noted. IV catheter discontinued intact. Site without signs and symptoms of complications. Dressing and pressure applied. Pt denies pain at the site currently. No complaints noted.  Patient free of lines, drains, and wounds.   An After Visit Summary (AVS) was printed and given to the patient. Patient escorted via wheelchair, and discharged home via private auto.  Arlyss Repress, RN

## 2019-10-12 NOTE — Discharge Summary (Signed)
Physician Discharge Summary  [name] DOB: 01/10/1986 DOA: 10/11/2019  PCP: Valerie Ebbs, MD  Admit date: 10/11/2019 Discharge date: 10/12/2019  Admitted From: Home Disposition: Home  Recommendations for Outpatient Follow-up:  1. Follow up with PCP in 1-2 weeks, nephrology and dialysis as scheduled 2. Please obtain BMP/CBC in one week  Discharge Condition: Stable CODE STATUS: Full Diet recommendation: Renal diet as tolerated  Brief/Interim Summary: Valerie S Cookis a 34 y.o.femalewith medical history significant ofESRD on HD MWF, history of renal transplant, anemia of ESRD, type 1 diabetes, hypertension, hyperlipidemia, GERD, gastroparesis, XXX syndrome presenting withcomplaints of abdominal pain and vomiting. Patient states she was not able to tolerate her entire session of dialysis yesterday due to abdominal pain. Reports generalized abdominal pain. She has continued to vomit since then and not able to tolerate p.o. intake. Reports vomiting blood. Denies cough, shortness of breath, or chest pain. Denies marijuana or any other drug use. Denies alcohol use. No additional history could be obtained from her. In ED: patient found to be afebrile.Tachycardic on arrival with heart rate in the 120s. Blood pressure elevated with systolic up to 400Q and diastolic up to 676P.Labs showing no leukocytosis. Hemoglobin 16.9. Platelet count 115. Potassium 6.8 on initial labs.Repeat labs confirmed hyperkalemia with potassium 7.0. Chloride 89. Blood glucose 386. Bicarb 26. No significant elevation of lipase or LFTs. Beta hCG negative. SARS-CoV-2 PCR test pending. CT showing small hiatal hernia with reflux in the distal esophagus. No other acute intra-abdominal pathology. EKG with QT prolongation and peaked T waves. Patient was given medical treatment for hyperkalemia including albuterol, insulin, Lokelma,sodium bicarb, and calcium gluconate. She was also given Reglan and  Protonix - transferred to [facility] for urgent dialysis the am of 10/11/19  Patient met as above with intractable abdominal pain nausea with profound hyperkalemia requiring urgent dialysis. Patient is now tolerated dialysis x2, feels quite well, able to tolerate p.o. without overt discomfort or pain, nausea markedly well controlled her usual baseline given her history of gastroparesis. Patient otherwise stable and agreeable for discharge home.  Discharge Diagnoses:  Principal Problem:   Hyperkalemia Active Problems:   Diabetic gastroparesis (HCC)   ESRD on hemodialysis ([place])   Hypertensive urgency   Hyperglycemia    Discharge Instructions  Discharge Instructions    Call MD for:  difficulty breathing, headache or visual disturbances   Complete by: As directed    Call MD for:  extreme fatigue   Complete by: As directed    Call MD for:  hives   Complete by: As directed    Call MD for:  persistant dizziness or light-headedness   Complete by: As directed    Call MD for:  persistant nausea and vomiting   Complete by: As directed    Call MD for:  redness, tenderness, or signs of infection (pain, swelling, redness, odor or green/yellow discharge around incision site)   Complete by: As directed    Call MD for:  severe uncontrolled pain   Complete by: As directed    Call MD for:  temperature >100.4   Complete by: As directed    Diet - low sodium heart healthy   Complete by: As directed    Increase activity slowly   Complete by: As directed      Allergies as of 10/12/2019      Reactions   Benadryl [diphenhydramine-zinc Acetate] Shortness Of Breath   Diphenhydramine Hcl Anaphylaxis   REACTION: Stopped breathing in ICU   Doxycycline Shortness Of  Breath   Motrin [ibuprofen] Shortness Of Breath, Itching   Per pt   Banana Other (See Comments)   Sick on the stomach   Iron Dextran Other (See Comments)   REACTION: vein irritation   Shellfish Allergy Hives   Chlorhexidine Itching       Medication List    STOP taking these medications   metoprolol tartrate 25 MG tablet Commonly known as: LOPRESSOR     TAKE these medications   Accu-Chek Softclix Lancets lancets Use to check blood sugar 4 times per day.   acetaminophen 500 MG tablet Commonly known as: TYLENOL Take 1,000 mg by mouth every 6 (six) hours as needed for moderate pain (pain).   b complex-vitamin c-folic acid 0.8 MG Tabs tablet Take 1 tablet by mouth every Monday, Wednesday, and Friday. After dialysis on dialysis days   calcium acetate 667 MG capsule Commonly known as: PHOSLO Take 1,334 mg by mouth 2 (two) times daily. Taking 1 capsule with a snack   calcium carbonate (dosed in mg elemental calcium) 1250 MG/5ML Susp Take 5 mLs by mouth 3 (three) times daily.   carvedilol 12.5 MG tablet Commonly known as: COREG Take 1 tablet (12.5 mg total) by mouth 2 (two) times daily with a meal.   cetirizine 10 MG tablet Commonly known as: ZYRTEC Take 10 mg by mouth daily as needed for allergies.   D 1000 25 MCG (1000 UT) capsule Generic drug: Cholecalciferol Take 1,000 Units by mouth every other day.   diclofenac Sodium 1 % Gel Commonly known as: VOLTAREN Apply 2 g topically 3 (three) times daily as needed (pain).   dicyclomine 20 MG tablet Commonly known as: BENTYL Take 1 tablet (20 mg total) by mouth 2 (two) times daily as needed (stomach issues.).   fluticasone 50 MCG/ACT nasal spray Commonly known as: FLONASE Place 2 sprays into both nostrils daily as needed for allergies.   gabapentin 400 MG capsule Commonly known as: NEURONTIN Take 400 mg by mouth 3 (three) times daily.   hydrOXYzine 50 MG tablet Commonly known as: ATARAX/VISTARIL Take 1 tablet (50 mg total) by mouth 2 (two) times daily as needed for itching.   insulin degludec 100 UNIT/ML FlexTouch Pen Commonly known as: TRESIBA Inject 0.6 mLs (60 Units total) into the skin 2 (two) times daily. Reports taking 24 units QAM What  changed:   how much to take  when to take this  additional instructions   loperamide 2 MG capsule Commonly known as: IMODIUM Take 2 mg by mouth 4 (four) times daily as needed for diarrhea or loose stools.   metoCLOPramide 10 MG tablet Commonly known as: REGLAN Take 1 tablet (10 mg total) by mouth 3 (three) times daily before meals.   MIRCERA IJ Mircera   NovoLOG FlexPen 100 UNIT/ML FlexPen Generic drug: insulin aspart Inject 2-10 Units into the skin 3 (three) times daily with meals. as needed for blood sugar management (sliding scale)   ondansetron 4 MG disintegrating tablet Commonly known as: Zofran ODT Take 1 tablet (4 mg total) by mouth every 8 (eight) hours as needed for nausea or vomiting.   oxyCODONE-acetaminophen 5-325 MG tablet Commonly known as: PERCOCET/ROXICET   pantoprazole 40 MG tablet Commonly known as: PROTONIX Take 40 mg by mouth See admin instructions. Once to twice daily   predniSONE 20 MG tablet Commonly known as: DELTASONE Take 60 mg by mouth daily.   simvastatin 20 MG tablet Commonly known as: ZOCOR Take 20 mg by mouth at bedtime.  Allergies  Allergen Reactions  . Benadryl [Diphenhydramine-Zinc Acetate] Shortness Of Breath  . Diphenhydramine Hcl Anaphylaxis    REACTION: Stopped breathing in ICU  . Doxycycline Shortness Of Breath  . Motrin [Ibuprofen] Shortness Of Breath and Itching    Per pt  . Banana Other (See Comments)    Sick on the stomach  . Iron Dextran Other (See Comments)    REACTION: vein irritation  . Shellfish Allergy Hives  . Chlorhexidine Itching    Consultations:  Nephrology    Procedures/Studies: CT Renal Stone Study  Result Date: 10/11/2019 CLINICAL DATA:  Flank pain EXAM: CT ABDOMEN AND PELVIS WITHOUT CONTRAST TECHNIQUE: Multidetector CT imaging of the abdomen and pelvis was performed following the standard protocol without IV contrast. COMPARISON:  June 19, 2019 FINDINGS: Lower chest: The visualized  heart size within normal limits. No pericardial fluid/thickening. There is a small hiatal hernia present with reflux in the distal esophagus. The visualized portions of the lungs are clear. Hepatobiliary: Although limited due to the lack of intravenous contrast, normal in appearance without gross focal abnormality. The patient is status post cholecystectomy. No biliary ductal dilation. Pancreas:  Unremarkable.  No surrounding inflammatory changes. Spleen: Normal in size. Although limited due to the lack of intravenous contrast, normal in appearance. Adrenals/Urinary Tract: Both adrenal glands appear normal. 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 There is stable atrophy of the left kidney. No hydronephrosis is noted. The bladder is decompressed. Stomach/Bowel: The stomach, small bowel, and colon are normal in appearance. No inflammatory changes or obstructive findings. Postsurgical changes are seen within the right lower quadrant. Vascular/Lymphatic: There are no enlarged abdominal or pelvic lymph nodes. No significant gross vascular findings are present. Reproductive: The patient is status post hysterectomy. No adnexal masses or collections seen. Other: No evidence of abdominal wall mass or hernia. Musculoskeletal: No acute or significant osseous findings. IMPRESSION: Small hiatal hernia with reflux in the distal esophagus. No other acute intra-abdominal or pelvic pathology to explain the patient's symptoms. Electronically Signed   By: Prudencio Pair M.D.   On: 10/11/2019 02:18     Subjective: No acute issues or events overnight, tolerating p.o. without overt nausea or vomiting, abdominal pain resolved, denies headache, fever, chills, chest pain, shortness of breath.   Discharge Exam: Vitals:   10/12/19 1247 10/12/19 1405  BP: (!) 104/57 (!) 132/93  Pulse: 94 (!) 108  Resp: 20 18  Temp: 98.3 F (36.8 C) 98.9 F (37.2 C)  SpO2: 98% 100%   Vitals:   10/12/19 1130 10/12/19 1200 10/12/19 1247  10/12/19 1405  BP: (!) 90/60 (!) 85/60 (!) 104/57 (!) 132/93  Pulse: 105 105 94 (!) 108  Resp: '20 18 20 18  ' Temp:   98.3 F (36.8 C) 98.9 F (37.2 C)  TempSrc:   Oral Oral  SpO2:   98% 100%  Weight:   80 kg   Height:        General: Pt is alert, awake, not in acute distress Cardiovascular: RRR, S1/S2 +, no rubs, no gallops Respiratory: CTA bilaterally, no wheezing, no rhonchi Abdominal: Soft, NT, ND, bowel sounds + Extremities: no edema, no cyanosis    The results of significant diagnostics from this hospitalization (including imaging, microbiology, ancillary and laboratory) are listed below for reference.     Microbiology: Recent Results (from the past 240 hour(s))  SARS Coronavirus 2 by RT PCR (hospital order, performed in [facility] hospital lab) Nasopharyngeal Nasopharyngeal Swab     Status: None  Collection Time: 10/11/19  6:19 AM   Specimen: Nasopharyngeal Swab  Result Value Ref Range Status   SARS Coronavirus 2 NEGATIVE NEGATIVE Final    Comment: (NOTE) SARS-CoV-2 target nucleic acids are NOT DETECTED.  The SARS-CoV-2 RNA is generally detectable in upper and lower respiratory specimens during the acute phase of infection. The lowest concentration of SARS-CoV-2 viral copies this assay can detect is 250 copies / mL. A negative result does not preclude SARS-CoV-2 infection and should not be used as the sole basis for treatment or other patient management decisions.  A negative result may occur with improper specimen collection / handling, submission of specimen other than nasopharyngeal swab, presence of viral mutation(s) within the areas targeted by this assay, and inadequate number of viral copies (<250 copies / mL). A negative result must be combined with clinical observations, patient history, and epidemiological information.  Fact Sheet for Patients:   StrictlyIdeas.no  Fact Sheet for Healthcare  Providers: BankingDealers.co.za  This test is not yet approved or  cleared by the [place] FDA and has been authorized for detection and/or diagnosis of SARS-CoV-2 by FDA under an Emergency Use Authorization (EUA).  This EUA will remain in effect (meaning this test can be used) for the duration of the COVID-19 declaration under Section 564(b)(1) of the Act, 21 U.S.C. section 360bbb-3(b)(1), unless the authorization is terminated or revoked sooner.  Performed at [facility], [place] [address], [place], [place] [address]      Labs: BNP (last 3 results) No results for input(s): BNP in the last 8760 hours. Basic Metabolic Panel: Recent Labs  Lab 10/10/19 1215 10/11/19 0148 10/11/19 0450 10/11/19 0910  NA 136 137  --  138  K 4.3 6.8* 7.0* 3.8  CL 96* 89*  --  95*  CO2 24 26  --  27  GLUCOSE 271* 386*  --  220*  BUN 22* 41*  --  24*  CREATININE 5.26* 7.49*  --  4.73*  CALCIUM 10.6* 9.9  --  10.9*  MG  --   --   --  2.2   Liver Function Tests: Recent Labs  Lab 10/10/19 1215 10/11/19 0148  AST 21 25  ALT 18 21  ALKPHOS 72 71  BILITOT 0.9 0.8  PROT 8.9* 9.9*  ALBUMIN 4.2 4.7   Recent Labs  Lab 10/10/19 1215 10/11/19 0148  LIPASE 73* 64*   No results for input(s): AMMONIA in the last 168 hours. CBC: Recent Labs  Lab 10/10/19 1215 10/11/19 0148 10/11/19 0740 10/12/19 0648  WBC 6.2 4.5 9.8 8.9  HGB 11.5* 16.9* 10.9* 10.3*  HCT 36.2 50.9* 33.5* 32.7*  MCV 96.5 94.4 95.2 98.5  PLT 145* 115* 181 184   Cardiac Enzymes: No results for input(s): CKTOTAL, CKMB, CKMBINDEX, TROPONINI in the last 168 hours. BNP: Invalid input(s): POCBNP CBG: Recent Labs  Lab 10/11/19 2034 10/12/19 0006 10/12/19 0514 10/12/19 0722 10/12/19 1341  GLUCAP 177* 167* 147* 168* 145*   D-Dimer No results for input(s): DDIMER in the last 72 hours. Hgb A1c No results for input(s): HGBA1C in the last 72 hours. Lipid Profile No results  for input(s): CHOL, HDL, LDLCALC, TRIG, CHOLHDL, LDLDIRECT in the last 72 hours. Thyroid function studies No results for input(s): TSH, T4TOTAL, T3FREE, THYROIDAB in the last 72 hours.  Invalid input(s): FREET3 Anemia work up No results for input(s): VITAMINB12, FOLATE, FERRITIN, TIBC, IRON, RETICCTPCT in the last 72 hours. Urinalysis    Component Value Date/Time   COLORURINE  RED (A) 09/03/2018 1118   APPEARANCEUR CLOUDY (A) 09/03/2018 1118   LABSPEC 1.015 09/03/2018 1118   PHURINE 8.0 09/03/2018 1118   GLUCOSEU 250 (A) 09/03/2018 1118   HGBUR LARGE (A) 09/03/2018 1118   HGBUR negative 01/26/2010 1026   BILIRUBINUR NEGATIVE 09/03/2018 1118   BILIRUBINUR NEG 01/01/2011 1050   KETONESUR NEGATIVE 09/03/2018 1118   PROTEINUR >300 (A) 09/03/2018 1118   UROBILINOGEN 0.2 01/09/2015 1333   NITRITE POSITIVE (A) 09/03/2018 1118   LEUKOCYTESUR TRACE (A) 09/03/2018 1118   Sepsis Labs Invalid input(s): PROCALCITONIN,  WBC,  LACTICIDVEN Microbiology Recent Results (from the past 240 hour(s))  SARS Coronavirus 2 by RT PCR (hospital order, performed in [facility] hospital lab) Nasopharyngeal Nasopharyngeal Swab     Status: None   Collection Time: 10/11/19  6:19 AM   Specimen: Nasopharyngeal Swab  Result Value Ref Range Status   SARS Coronavirus 2 NEGATIVE NEGATIVE Final    Comment: (NOTE) SARS-CoV-2 target nucleic acids are NOT DETECTED.  The SARS-CoV-2 RNA is generally detectable in upper and lower respiratory specimens during the acute phase of infection. The lowest concentration of SARS-CoV-2 viral copies this assay can detect is 250 copies / mL. A negative result does not preclude SARS-CoV-2 infection and should not be used as the sole basis for treatment or other patient management decisions.  A negative result may occur with improper specimen collection / handling, submission of specimen other than nasopharyngeal swab, presence of viral mutation(s) within the areas targeted by  this assay, and inadequate number of viral copies (<250 copies / mL). A negative result must be combined with clinical observations, patient history, and epidemiological information.  Fact Sheet for Patients:   StrictlyIdeas.no  Fact Sheet for Healthcare Providers: BankingDealers.co.za  This test is not yet approved or  cleared by the [place] FDA and has been authorized for detection and/or diagnosis of SARS-CoV-2 by FDA under an Emergency Use Authorization (EUA).  This EUA will remain in effect (meaning this test can be used) for the duration of the COVID-19 declaration under Section 564(b)(1) of the Act, 21 U.S.C. section 360bbb-3(b)(1), unless the authorization is terminated or revoked sooner.  Performed at [facility], [place] [address] [place], [place] [address]      Time coordinating discharge: Over 30 minutes  SIGNED:   Little Ishikawa, DO [facility] 10/12/2019, 2:56 PM Pager   If 7PM-7AM, please contact night-coverage [email or web address]

## 2019-10-12 NOTE — Care Management Obs Status (Signed)
[place] NOTIFICATION   Patient Details  Name: [name] MRN: [id number] Date of Birth: 08/17/1985   Medicare Observation Status Notification Given:  Yes    Bartholomew Crews, RN 10/12/2019, 3:43 PM

## 2019-10-12 NOTE — Progress Notes (Signed)
[facility] Progress Note   Dialysis Orders: MWF - [place]  4hrs, BFR 350, DFR 800,  EDW 83kg, 2K/ 3.5Ca P4- post wt 7/28 83.9  Access: LU AVF  Heparin 4000 unit bolus, 2000 intermittent Mircera 50 mcg q2wks - last 7/26  Assessment/Plan: 1. Hyperkalemia - K 7.0 had emergent HD Thursday K 3.8 mid dialysis yesterday - not rechecked this am because the patient told the dialysis RN that she had labs drawn earlier - but she did not have a BMP - RN put on 3 K bath at Qb 300 - have changed to 2K for the remainder (2.5 hr of treatment)  And ^ Qb to 350- repeat BMP in am  2. Abdominal pain/n/v - CT abd negative for acute process. Hx gastroparesis. Pain meds pre primary 3. ESRD -   MWF.  Emergent HD for ^K Thursday  Back on schedule today  4. Hypertension/volume  - BP elevated down to 102/57 on HD goal 3 L tachy 110s.  Continue home meds.  Does not appear volume overloaded. UF as tolerated. Titrate to edw as able . Pulse generally in the 90s though sometimes higher at dialysis. Pr eweight was in bed at 77 kg - advised RN to get standing weight post HD. On coreg 12.5 bid - resuming this pm  should help tachycardia- she is below edw IF bed weight is correct - BP down now - likely related in part due to pain meds but also comes down during HD -kept even on HD Thursday - will have a standing wt post HD 5. Anemia of CKD - Hgb 10.3.  ESA  Dosed 7/26 6. Secondary Hyperparathyroidism - . Not on VDRA. Resume Ca acetate  7. Nutrition - Renal/carb mod diet w/fluid restrictions. Resume renavite 8. DM - per primary   Valerie Jacobson, PA-C [place] [phone] 10/12/2019,10:04 AM  LOS: 0 days   Subjective:   Tells me her PCP Rx pain meds whenever she has a gastroparesis flare. Had pain med on dialysis today  Objective Vitals:   10/11/19 1600 10/11/19 2035 10/12/19 0006 10/12/19 0515  BP: (!) 94/64 (!) 173/98 (!) 152/70 (!) 167/89  Pulse: (!) 110 (!) 120 (!) 108 (!) 110   Resp: 14 18 18 18   Temp: 98.8 F (37.1 C) 98.4 F (36.9 C) 98.4 F (36.9 C) 98.4 F (36.9 C)  TempSrc: Oral     SpO2: 99% 95% 93% 97%  Weight: 79.4 kg 79.4 kg    Height: 5\' 6"  (1.676 m)      Physical Exam General: NAD dozing and rouses easily Heart: tachy rate 119 Lungs: no rales Abdomen: soft NT at present Extremities: no LE edema Dialysis Access: left upper AVF Qb ^ to 350   Additional Objective Labs: Basic Metabolic Panel: Recent Labs  Lab 10/10/19 1215 10/10/19 1215 10/11/19 0148 10/11/19 0450 10/11/19 0910  NA 136  --  137  --  138  K 4.3   < > 6.8* 7.0* 3.8  CL 96*  --  89*  --  95*  CO2 24  --  26  --  27  GLUCOSE 271*  --  386*  --  220*  BUN 22*  --  41*  --  24*  CREATININE 5.26*  --  7.49*  --  4.73*  CALCIUM 10.6*  --  9.9  --  10.9*   < > = values in this interval not displayed.   Liver Function Tests: Recent Labs  Lab 10/10/19 1215 10/11/19 0148  AST 21 25  ALT 18 21  ALKPHOS 72 71  BILITOT 0.9 0.8  PROT 8.9* 9.9*  ALBUMIN 4.2 4.7   Recent Labs  Lab 10/10/19 1215 10/11/19 0148  LIPASE 73* 64*   CBC: Recent Labs  Lab 10/10/19 1215 10/10/19 1215 10/11/19 0148 10/11/19 0740 10/12/19 0648  WBC 6.2   < > 4.5 9.8 8.9  HGB 11.5*   < > 16.9* 10.9* 10.3*  HCT 36.2   < > 50.9* 33.5* 32.7*  MCV 96.5  --  94.4 95.2 98.5  PLT 145*   < > 115* 181 184   < > = values in this interval not displayed.   Blood Culture    Component Value Date/Time   SDES ABSCESS 10/28/2018 1335   SPECREQUEST NONE 10/28/2018 1335   CULT  10/28/2018 1335    FEW NORMAL SKIN FLORA NO ANAEROBES ISOLATED Performed at [facility], [place] [address] [place], [place] [address]    REPTSTATUS 11/02/2018 FINAL 10/28/2018 1335    Cardiac Enzymes: No results for input(s): CKTOTAL, CKMB, CKMBINDEX, TROPONINI in the last 168 hours. CBG: Recent Labs  Lab 10/11/19 1611 10/11/19 2034 10/12/19 0006 10/12/19 0514 10/12/19 0722  GLUCAP 175* 177* 167* 147* 168*    Iron Studies: No results for input(s): IRON, TIBC, TRANSFERRIN, FERRITIN in the last 72 hours. Lab Results  Component Value Date   INR 1.1 06/19/2019   INR 1.3 (H) 10/28/2018   INR 1.35 06/24/2014   Studies/Results: CT Renal Stone Study  Result Date: 10/11/2019 CLINICAL DATA:  Flank pain EXAM: CT ABDOMEN AND PELVIS WITHOUT CONTRAST TECHNIQUE: Multidetector CT imaging of the abdomen and pelvis was performed following the standard protocol without IV contrast. COMPARISON:  June 19, 2019 FINDINGS: Lower chest: The visualized heart size within normal limits. No pericardial fluid/thickening. There is a small hiatal hernia present with reflux in the distal esophagus. The visualized portions of the lungs are clear. Hepatobiliary: Although limited due to the lack of intravenous contrast, normal in appearance without gross focal abnormality. The patient is status post cholecystectomy. No biliary ductal dilation. Pancreas:  Unremarkable.  No surrounding inflammatory changes. Spleen: Normal in size. Although limited due to the lack of intravenous contrast, normal in appearance. Adrenals/Urinary Tract: Both adrenal glands appear normal. 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 There is stable atrophy of the left kidney. No hydronephrosis is noted. The bladder is decompressed. Stomach/Bowel: The stomach, small bowel, and colon are normal in appearance. No inflammatory changes or obstructive findings. Postsurgical changes are seen within the right lower quadrant. Vascular/Lymphatic: There are no enlarged abdominal or pelvic lymph nodes. No significant gross vascular findings are present. Reproductive: The patient is status post hysterectomy. No adnexal masses or collections seen. Other: No evidence of abdominal wall mass or hernia. Musculoskeletal: No acute or significant osseous findings. IMPRESSION: Small hiatal hernia with reflux in the distal esophagus. No other acute intra-abdominal or pelvic pathology to  explain the patient's symptoms. Electronically Signed   By: Prudencio Pair M.D.   On: 10/11/2019 02:18   Medications: . sodium chloride    . sodium chloride     . heparin sodium (porcine)      . insulin aspart  0-6 Units Subcutaneous Q4H  . insulin glargine  20 Units Subcutaneous Daily  . pantoprazole (PROTONIX) IV  40 mg Intravenous Q12H       [facility] Progress Note   Dialysis Orders:  Assessment/Plan: 1.  2. ESRD - 3. Anemia -  4. Secondary hyperparathyroidism -  5. HTN/volume -  6. Nutrition -   Valerie Jacobson, PA-C [facility] [phone] 10/12/2019,10:04 AM  LOS: 0 days   Subjective:     Objective Vitals:   10/11/19 1600 10/11/19 2035 10/12/19 0006 10/12/19 0515  BP: (!) 94/64 (!) 173/98 (!) 152/70 (!) 167/89  Pulse: (!) 110 (!) 120 (!) 108 (!) 110  Resp: 14 18 18 18   Temp: 98.8 F (37.1 C) 98.4 F (36.9 C) 98.4 F (36.9 C) 98.4 F (36.9 C)  TempSrc: Oral     SpO2: 99% 95% 93% 97%  Weight: 79.4 kg 79.4 kg    Height: 5\' 6"  (1.676 m)      Physical Exam General: Heart: Lungs: Abdomen: Extremities: Dialysis Access:    Additional Objective Labs: Basic Metabolic Panel: Recent Labs  Lab 10/10/19 1215 10/10/19 1215 10/11/19 0148 10/11/19 0450 10/11/19 0910  NA 136  --  137  --  138  K 4.3   < > 6.8* 7.0* 3.8  CL 96*  --  89*  --  95*  CO2 24  --  26  --  27  GLUCOSE 271*  --  386*  --  220*  BUN 22*  --  41*  --  24*  CREATININE 5.26*  --  7.49*  --  4.73*  CALCIUM 10.6*  --  9.9  --  10.9*   < > = values in this interval not displayed.   Liver Function Tests: Recent Labs  Lab 10/10/19 1215 10/11/19 0148  AST 21 25  ALT 18 21  ALKPHOS 72 71  BILITOT 0.9 0.8  PROT 8.9* 9.9*  ALBUMIN 4.2 4.7   Recent Labs  Lab 10/10/19 1215 10/11/19 0148  LIPASE 73* 64*   CBC: Recent Labs  Lab 10/10/19 1215 10/10/19 1215 10/11/19 0148 10/11/19 0740 10/12/19 0648  WBC 6.2   < > 4.5 9.8 8.9  HGB  11.5*   < > 16.9* 10.9* 10.3*  HCT 36.2   < > 50.9* 33.5* 32.7*  MCV 96.5  --  94.4 95.2 98.5  PLT 145*   < > 115* 181 184   < > = values in this interval not displayed.   Blood Culture    Component Value Date/Time   SDES ABSCESS 10/28/2018 1335   SPECREQUEST NONE 10/28/2018 1335   CULT  10/28/2018 1335    FEW NORMAL SKIN FLORA NO ANAEROBES ISOLATED Performed at [facility], [place] [address] [place], [place] [address]    REPTSTATUS 11/02/2018 FINAL 10/28/2018 1335    Cardiac Enzymes: No results for input(s): CKTOTAL, CKMB, CKMBINDEX, TROPONINI in the last 168 hours. CBG: Recent Labs  Lab 10/11/19 1611 10/11/19 2034 10/12/19 0006 10/12/19 0514 10/12/19 0722  GLUCAP 175* 177* 167* 147* 168*   Iron Studies: No results for input(s): IRON, TIBC, TRANSFERRIN, FERRITIN in the last 72 hours. Lab Results  Component Value Date   INR 1.1 06/19/2019   INR 1.3 (H) 10/28/2018   INR 1.35 06/24/2014   Studies/Results: CT Renal Stone Study  Result Date: 10/11/2019 CLINICAL DATA:  Flank pain EXAM: CT ABDOMEN AND PELVIS WITHOUT CONTRAST TECHNIQUE: Multidetector CT imaging of the abdomen and pelvis was performed following the standard protocol without IV contrast. COMPARISON:  June 19, 2019 FINDINGS: Lower chest: The visualized heart size within normal limits. No pericardial fluid/thickening. There is a small hiatal hernia present with reflux in the distal esophagus. The  visualized portions of the lungs are clear. Hepatobiliary: Although limited due to the lack of intravenous contrast, normal in appearance without gross focal abnormality. The patient is status post cholecystectomy. No biliary ductal dilation. Pancreas:  Unremarkable.  No surrounding inflammatory changes. Spleen: Normal in size. Although limited due to the lack of intravenous contrast, normal in appearance. Adrenals/Urinary Tract: Both adrenal glands appear normal. 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 There is stable  atrophy of the left kidney. No hydronephrosis is noted. The bladder is decompressed. Stomach/Bowel: The stomach, small bowel, and colon are normal in appearance. No inflammatory changes or obstructive findings. Postsurgical changes are seen within the right lower quadrant. Vascular/Lymphatic: There are no enlarged abdominal or pelvic lymph nodes. No significant gross vascular findings are present. Reproductive: The patient is status post hysterectomy. No adnexal masses or collections seen. Other: No evidence of abdominal wall mass or hernia. Musculoskeletal: No acute or significant osseous findings. IMPRESSION: Small hiatal hernia with reflux in the distal esophagus. No other acute intra-abdominal or pelvic pathology to explain the patient's symptoms. Electronically Signed   By: Prudencio Pair M.D.   On: 10/11/2019 02:18   Medications: . sodium chloride    . sodium chloride     . heparin sodium (porcine)      . insulin aspart  0-6 Units Subcutaneous Q4H  . insulin glargine  20 Units Subcutaneous Daily  . pantoprazole (PROTONIX) IV  40 mg Intravenous Q12H

## 2019-10-12 NOTE — Procedures (Signed)
I was present at this dialysis session. I have reviewed the session itself and made appropriate changes.  Seen and examined on HD, BP lower after receiving pain medications. Feels dizzy. Cutting off UF for now. Started UF profile. qbf 350, uf goal 3000, lue avf, BP 74/35, HR 75  Filed Weights   10/11/19 1600 10/11/19 2035 10/12/19 0837  Weight: 79.4 kg 79.4 kg 77 kg    Recent Labs  Lab 10/11/19 0910  NA 138  K 3.8  CL 95*  CO2 27  GLUCOSE 220*  BUN 24*  CREATININE 4.73*  CALCIUM 10.9*    Recent Labs  Lab 10/11/19 0148 10/11/19 0740 10/12/19 0648  WBC 4.5 9.8 8.9  HGB 16.9* 10.9* 10.3*  HCT 50.9* 33.5* 32.7*  MCV 94.4 95.2 98.5  PLT 115* 181 184    Scheduled Meds: . calcium acetate  1,334 mg Oral TID WC  . carvedilol  12.5 mg Oral BID WC  . insulin aspart  0-6 Units Subcutaneous Q4H  . insulin glargine  20 Units Subcutaneous Daily  . multivitamin  1 tablet Oral QHS  . pantoprazole (PROTONIX) IV  40 mg Intravenous Q12H   Continuous Infusions: . sodium chloride    . sodium chloride     PRN Meds:.sodium chloride, sodium chloride, acetaminophen **OR** acetaminophen, heparin, hydrALAZINE, lidocaine (PF), lidocaine-prilocaine, LORazepam, oxyCODONE-acetaminophen **OR** [DISCONTINUED]  HYDROmorphone (DILAUDID) injection, pentafluoroprop-tetrafluoroeth   Gean Quint, MD [facility] 10/12/2019, 10:57 AM

## 2019-10-13 ENCOUNTER — Emergency Department (HOSPITAL_COMMUNITY)
Admission: EM | Admit: 2019-10-13 | Discharge: 2019-10-14 | Disposition: A | Discharge status: Home / Self Care | Payer: Medicare Other | Source: Home / Self Care

## 2019-10-13 ENCOUNTER — Encounter (HOSPITAL_COMMUNITY): Payer: Self-pay

## 2019-10-13 ENCOUNTER — Telehealth: Payer: Self-pay | Admitting: Nephrology

## 2019-10-13 DIAGNOSIS — R112 Nausea with vomiting, unspecified: Secondary | ICD-10-CM | POA: Insufficient documentation

## 2019-10-13 DIAGNOSIS — Z5321 Procedure and treatment not carried out due to patient leaving prior to being seen by health care provider: Secondary | ICD-10-CM | POA: Insufficient documentation

## 2019-10-13 DIAGNOSIS — R1084 Generalized abdominal pain: Secondary | ICD-10-CM | POA: Insufficient documentation

## 2019-10-13 DIAGNOSIS — R197 Diarrhea, unspecified: Secondary | ICD-10-CM | POA: Insufficient documentation

## 2019-10-13 LAB — COMPREHENSIVE METABOLIC PANEL
ALT: 18 U/L (ref 0–44)
AST: 20 U/L (ref 15–41)
Albumin: 4.7 g/dL (ref 3.5–5.0)
Alkaline Phosphatase: 65 U/L (ref 38–126)
Anion gap: 21 — ABNORMAL HIGH (ref 5–15)
BUN: 39 mg/dL — ABNORMAL HIGH (ref 6–20)
CO2: 21 mmol/L — ABNORMAL LOW (ref 22–32)
Calcium: 8.3 mg/dL — ABNORMAL LOW (ref 8.9–10.3)
Chloride: 92 mmol/L — ABNORMAL LOW (ref 98–111)
Creatinine, Ser: 7.38 mg/dL — ABNORMAL HIGH (ref 0.44–1.00)
GFR calc Af Amer: 7 mL/min — ABNORMAL LOW (ref >60)
GFR calc non Af Amer: 6 mL/min — ABNORMAL LOW (ref >60)
Glucose, Bld: 242 mg/dL — ABNORMAL HIGH (ref 70–99)
Potassium: 4.3 mmol/L (ref 3.5–5.1)
Sodium: 134 mmol/L — ABNORMAL LOW (ref 135–145)
Total Bilirubin: 1.3 mg/dL — ABNORMAL HIGH (ref 0.3–1.2)
Total Protein: 9.7 g/dL — ABNORMAL HIGH (ref 6.5–8.1)

## 2019-10-13 LAB — CBC
HCT: 40.2 % (ref 36.0–46.0)
Hemoglobin: 12.8 g/dL (ref 12.0–15.0)
MCH: 31.4 pg (ref 26.0–34.0)
MCHC: 31.8 g/dL (ref 30.0–36.0)
MCV: 98.5 fL (ref 80.0–100.0)
Platelets: 249 10*3/uL (ref 150–400)
RBC: 4.08 MIL/uL (ref 3.87–5.11)
RDW: 14.9 % (ref 11.5–15.5)
WBC: 11.7 10*3/uL — ABNORMAL HIGH (ref 4.0–10.5)
nRBC: 0 % (ref 0.0–0.2)

## 2019-10-13 LAB — LIPASE, BLOOD: Lipase: 46 U/L (ref 11–51)

## 2019-10-13 LAB — I-STAT BETA HCG BLOOD, ED (MC, WL, AP ONLY): I-stat hCG, quantitative: <5 m[IU]/mL (ref <5)

## 2019-10-13 MED ORDER — SODIUM CHLORIDE 0.9% FLUSH: 3.0000 mL | Freq: Once | INTRAVENOUS | Status: DC

## 2019-10-13 NOTE — ED Notes (Signed)
Was told pt left out has not returned

## 2019-10-13 NOTE — Telephone Encounter (Signed)
Transition of care contact from inpatient facility  Date of Discharge:7/30 Date of Contact: 7/31 Method of contact: phone Talked with: patient  Patient contact to discuss transition of care from recent inpatient hospitalization. Pateint was admitted to [facility] from 7/27 - 7/30 with discharge dx of hyperkalemia, abdominal pain with N and V  Medication changes were reviewed. Out of reglan, doesn't have phenergan.  Presently with N, V. Requesting pain meds.  Plan phenergan supp and also po phenergan to be Rx to pharmacy as well as reglan.  Advised to first take phenergan suppository then reglan and I will follow up with her tomorrow. Encouraged to try these things before returning to ED.  Patient will follow up at outpatient dialysis on Monday  Amalia Hailey, PA-C [facility] Pager:  [phone]

## 2019-10-13 NOTE — ED Triage Notes (Addendum)
Pt arrives to ED w/ c/o generalized abdominal pain that started Friday. Pt reports 10/10 pain and endorses N/V/D. Pt tearful in triage. Pt MWF dialysis pt, states she received full treatment yesterday.

## 2019-10-14 ENCOUNTER — Other Ambulatory Visit: Payer: Self-pay

## 2019-10-14 ENCOUNTER — Inpatient Hospital Stay (HOSPITAL_COMMUNITY)
Admission: EM | Admit: 2019-10-14 | Discharge: 2019-10-16 | DRG: 073 | Disposition: A | Discharge status: Home / Self Care | Payer: Medicare Other | Attending: Internal Medicine | Admitting: Internal Medicine

## 2019-10-14 ENCOUNTER — Encounter (HOSPITAL_COMMUNITY): Payer: Self-pay | Admitting: Emergency Medicine

## 2019-10-14 ENCOUNTER — Emergency Department (HOSPITAL_COMMUNITY): Payer: Medicare Other

## 2019-10-14 DIAGNOSIS — G8929 Other chronic pain: Secondary | ICD-10-CM | POA: Diagnosis present

## 2019-10-14 DIAGNOSIS — Z91018 Allergy to other foods: Secondary | ICD-10-CM

## 2019-10-14 DIAGNOSIS — Z20822 Contact with and (suspected) exposure to covid-19: Secondary | ICD-10-CM | POA: Diagnosis present

## 2019-10-14 DIAGNOSIS — I959 Hypotension, unspecified: Secondary | ICD-10-CM | POA: Diagnosis present

## 2019-10-14 DIAGNOSIS — R9431 Abnormal electrocardiogram [ECG] [EKG]: Secondary | ICD-10-CM | POA: Diagnosis present

## 2019-10-14 DIAGNOSIS — D631 Anemia in chronic kidney disease: Secondary | ICD-10-CM | POA: Diagnosis present

## 2019-10-14 DIAGNOSIS — I10 Essential (primary) hypertension: Secondary | ICD-10-CM | POA: Diagnosis present

## 2019-10-14 DIAGNOSIS — E1065 Type 1 diabetes mellitus with hyperglycemia: Secondary | ICD-10-CM | POA: Diagnosis present

## 2019-10-14 DIAGNOSIS — Z7952 Long term (current) use of systemic steroids: Secondary | ICD-10-CM

## 2019-10-14 DIAGNOSIS — Z833 Family history of diabetes mellitus: Secondary | ICD-10-CM

## 2019-10-14 DIAGNOSIS — E8889 Other specified metabolic disorders: Secondary | ICD-10-CM | POA: Diagnosis present

## 2019-10-14 DIAGNOSIS — R109 Unspecified abdominal pain: Secondary | ICD-10-CM | POA: Diagnosis present

## 2019-10-14 DIAGNOSIS — N2581 Secondary hyperparathyroidism of renal origin: Secondary | ICD-10-CM | POA: Diagnosis present

## 2019-10-14 DIAGNOSIS — E785 Hyperlipidemia, unspecified: Secondary | ICD-10-CM | POA: Diagnosis present

## 2019-10-14 DIAGNOSIS — Z8249 Family history of ischemic heart disease and other diseases of the circulatory system: Secondary | ICD-10-CM

## 2019-10-14 DIAGNOSIS — Z79891 Long term (current) use of opiate analgesic: Secondary | ICD-10-CM

## 2019-10-14 DIAGNOSIS — E1043 Type 1 diabetes mellitus with diabetic autonomic (poly)neuropathy: Principal | ICD-10-CM | POA: Diagnosis present

## 2019-10-14 DIAGNOSIS — IMO0002 Reserved for concepts with insufficient information to code with codable children: Secondary | ICD-10-CM | POA: Diagnosis present

## 2019-10-14 DIAGNOSIS — Y830 Surgical operation with transplant of whole organ as the cause of abnormal reaction of the patient, or of later complication, without mention of misadventure at the time of the procedure: Secondary | ICD-10-CM | POA: Diagnosis present

## 2019-10-14 DIAGNOSIS — Z23 Encounter for immunization: Secondary | ICD-10-CM

## 2019-10-14 DIAGNOSIS — Z794 Long term (current) use of insulin: Secondary | ICD-10-CM

## 2019-10-14 DIAGNOSIS — Z9049 Acquired absence of other specified parts of digestive tract: Secondary | ICD-10-CM

## 2019-10-14 DIAGNOSIS — R079 Chest pain, unspecified: Secondary | ICD-10-CM

## 2019-10-14 DIAGNOSIS — E1022 Type 1 diabetes mellitus with diabetic chronic kidney disease: Secondary | ICD-10-CM | POA: Diagnosis present

## 2019-10-14 DIAGNOSIS — R112 Nausea with vomiting, unspecified: Secondary | ICD-10-CM | POA: Diagnosis not present

## 2019-10-14 DIAGNOSIS — N189 Chronic kidney disease, unspecified: Secondary | ICD-10-CM | POA: Diagnosis present

## 2019-10-14 DIAGNOSIS — Z79899 Other long term (current) drug therapy: Secondary | ICD-10-CM

## 2019-10-14 DIAGNOSIS — T8612 Kidney transplant failure: Secondary | ICD-10-CM | POA: Diagnosis present

## 2019-10-14 DIAGNOSIS — I120 Hypertensive chronic kidney disease with stage 5 chronic kidney disease or end stage renal disease: Secondary | ICD-10-CM | POA: Diagnosis present

## 2019-10-14 DIAGNOSIS — Z992 Dependence on renal dialysis: Secondary | ICD-10-CM

## 2019-10-14 DIAGNOSIS — Z888 Allergy status to other drugs, medicaments and biological substances status: Secondary | ICD-10-CM

## 2019-10-14 DIAGNOSIS — T781XXA Other adverse food reactions, not elsewhere classified, initial encounter: Secondary | ICD-10-CM | POA: Diagnosis present

## 2019-10-14 DIAGNOSIS — K920 Hematemesis: Secondary | ICD-10-CM | POA: Diagnosis present

## 2019-10-14 DIAGNOSIS — Z881 Allergy status to other antibiotic agents status: Secondary | ICD-10-CM

## 2019-10-14 DIAGNOSIS — K219 Gastro-esophageal reflux disease without esophagitis: Secondary | ICD-10-CM | POA: Diagnosis present

## 2019-10-14 DIAGNOSIS — N186 End stage renal disease: Secondary | ICD-10-CM | POA: Diagnosis present

## 2019-10-14 DIAGNOSIS — E109 Type 1 diabetes mellitus without complications: Secondary | ICD-10-CM | POA: Diagnosis present

## 2019-10-14 DIAGNOSIS — Q970 Karyotype 47, XXX: Secondary | ICD-10-CM

## 2019-10-14 DIAGNOSIS — Z8261 Family history of arthritis: Secondary | ICD-10-CM

## 2019-10-14 DIAGNOSIS — K3184 Gastroparesis: Secondary | ICD-10-CM

## 2019-10-14 DIAGNOSIS — R778 Other specified abnormalities of plasma proteins: Secondary | ICD-10-CM | POA: Diagnosis present

## 2019-10-14 LAB — CBC
HCT: 38.4 % (ref 36.0–46.0)
Hemoglobin: 12.8 g/dL (ref 12.0–15.0)
MCH: 32.1 pg (ref 26.0–34.0)
MCHC: 33.3 g/dL (ref 30.0–36.0)
MCV: 96.2 fL (ref 80.0–100.0)
Platelets: 246 10*3/uL (ref 150–400)
RBC: 3.99 MIL/uL (ref 3.87–5.11)
RDW: 15 % (ref 11.5–15.5)
WBC: 14.6 10*3/uL — ABNORMAL HIGH (ref 4.0–10.5)
nRBC: 0 % (ref 0.0–0.2)

## 2019-10-14 LAB — COMPREHENSIVE METABOLIC PANEL
ALT: 17 U/L (ref 0–44)
AST: 16 U/L (ref 15–41)
Albumin: 4.9 g/dL (ref 3.5–5.0)
Alkaline Phosphatase: 66 U/L (ref 38–126)
Anion gap: 23 — ABNORMAL HIGH (ref 5–15)
BUN: 56 mg/dL — ABNORMAL HIGH (ref 6–20)
CO2: 24 mmol/L (ref 22–32)
Calcium: 7.9 mg/dL — ABNORMAL LOW (ref 8.9–10.3)
Chloride: 87 mmol/L — ABNORMAL LOW (ref 98–111)
Creatinine, Ser: 8.71 mg/dL — ABNORMAL HIGH (ref 0.44–1.00)
GFR calc Af Amer: 6 mL/min — ABNORMAL LOW (ref >60)
GFR calc non Af Amer: 5 mL/min — ABNORMAL LOW (ref >60)
Glucose, Bld: 296 mg/dL — ABNORMAL HIGH (ref 70–99)
Potassium: 3.9 mmol/L (ref 3.5–5.1)
Sodium: 134 mmol/L — ABNORMAL LOW (ref 135–145)
Total Bilirubin: 1.5 mg/dL — ABNORMAL HIGH (ref 0.3–1.2)
Total Protein: 9.8 g/dL — ABNORMAL HIGH (ref 6.5–8.1)

## 2019-10-14 LAB — LIPASE, BLOOD: Lipase: 52 U/L — ABNORMAL HIGH (ref 11–51)

## 2019-10-14 LAB — TROPONIN I (HIGH SENSITIVITY)
Troponin I (High Sensitivity): 32 ng/L — ABNORMAL HIGH (ref <18)
Troponin I (High Sensitivity): 40 ng/L — ABNORMAL HIGH (ref <18)
Troponin I (High Sensitivity): 35 ng/L — ABNORMAL HIGH (ref <18)

## 2019-10-14 LAB — GLUCOSE, CAPILLARY
Glucose-Capillary: 239 mg/dL — ABNORMAL HIGH (ref 70–99)
Glucose-Capillary: 279 mg/dL — ABNORMAL HIGH (ref 70–99)

## 2019-10-14 LAB — MRSA PCR SCREENING: MRSA by PCR: NEGATIVE

## 2019-10-14 LAB — SARS CORONAVIRUS 2 BY RT PCR (HOSPITAL ORDER, PERFORMED IN ~~LOC~~ HOSPITAL LAB): SARS Coronavirus 2: NEGATIVE

## 2019-10-14 LAB — HCG, QUANTITATIVE, PREGNANCY: hCG, Beta Chain, Quant, S: 4 m[IU]/mL (ref <5)

## 2019-10-14 MED ORDER — SODIUM CHLORIDE 0.9 % IV BOLUS
1000.0000 mL | Freq: Once | INTRAVENOUS | Status: AC
  Administered 2019-10-14: 1000 mL via INTRAVENOUS

## 2019-10-14 MED ORDER — IOHEXOL 300 MG/ML  SOLN
100.0000 mL | Freq: Once | INTRAMUSCULAR | Status: AC | PRN
  Administered 2019-10-14: 100 mL via INTRAVENOUS

## 2019-10-14 MED ORDER — INSULIN ASPART 100 UNIT/ML ~~LOC~~ SOLN
0.0000 [IU] | Freq: Three times a day (TID) | SUBCUTANEOUS | Status: DC
  Administered 2019-10-14: 2 [IU] via SUBCUTANEOUS
  Administered 2019-10-15: 1 [IU] via SUBCUTANEOUS
  Administered 2019-10-15: 2 [IU] via SUBCUTANEOUS
  Filled 2019-10-14: qty 0.06

## 2019-10-14 MED ORDER — HYDROXYZINE HCL 25 MG PO TABS
50.0000 mg | ORAL_TABLET | Freq: Two times a day (BID) | ORAL | Status: DC | PRN
  Administered 2019-10-14: 50 mg via ORAL
  Filled 2019-10-14: qty 2

## 2019-10-14 MED ORDER — LOPERAMIDE HCL 2 MG PO CAPS
2.0000 mg | ORAL_CAPSULE | ORAL | Status: DC | PRN
  Administered 2019-10-14: 2 mg via ORAL
  Filled 2019-10-14: qty 1

## 2019-10-14 MED ORDER — CALCIUM CARBONATE ANTACID 1250 MG/5ML PO SUSP
500.0000 mg | Freq: Three times a day (TID) | ORAL | Status: DC
  Administered 2019-10-14 – 2019-10-16 (×5): 500 mg via ORAL
  Filled 2019-10-14 (×7): qty 5

## 2019-10-14 MED ORDER — HYDROMORPHONE HCL 1 MG/ML IJ SOLN
0.5000 mg | INTRAMUSCULAR | Status: DC | PRN
  Administered 2019-10-14: 0.5 mg via INTRAVENOUS
  Filled 2019-10-14: qty 1

## 2019-10-14 MED ORDER — DICYCLOMINE HCL 20 MG PO TABS
20.0000 mg | ORAL_TABLET | Freq: Two times a day (BID) | ORAL | Status: DC | PRN
  Administered 2019-10-15 (×2): 20 mg via ORAL
  Filled 2019-10-14 (×5): qty 1

## 2019-10-14 MED ORDER — ACETAMINOPHEN 325 MG PO TABS: 650.0000 mg | ORAL_TABLET | Freq: Four times a day (QID) | ORAL | Status: DC | PRN

## 2019-10-14 MED ORDER — SODIUM CHLORIDE (PF) 0.9 % IJ SOLN
INTRAMUSCULAR | Status: AC
  Filled 2019-10-14: qty 50

## 2019-10-14 MED ORDER — BOOST / RESOURCE BREEZE PO LIQD CUSTOM
1.0000 | Freq: Three times a day (TID) | ORAL | Status: DC
  Administered 2019-10-14 – 2019-10-16 (×3): 1 via ORAL

## 2019-10-14 MED ORDER — HYDROMORPHONE HCL 1 MG/ML IJ SOLN
1.0000 mg | Freq: Once | INTRAMUSCULAR | Status: AC
  Administered 2019-10-14: 1 mg via INTRAVENOUS
  Filled 2019-10-14: qty 1

## 2019-10-14 MED ORDER — FLUTICASONE PROPIONATE 50 MCG/ACT NA SUSP
2.0000 | Freq: Every day | NASAL | Status: DC | PRN
  Filled 2019-10-14: qty 16

## 2019-10-14 MED ORDER — INSULIN GLARGINE 100 UNIT/ML ~~LOC~~ SOLN
10.0000 [IU] | Freq: Every day | SUBCUTANEOUS | Status: DC
  Administered 2019-10-14: 10 [IU] via SUBCUTANEOUS
  Filled 2019-10-14 (×2): qty 0.1

## 2019-10-14 MED ORDER — PANTOPRAZOLE SODIUM 40 MG IV SOLR
40.0000 mg | Freq: Two times a day (BID) | INTRAVENOUS | Status: DC
  Administered 2019-10-14 – 2019-10-15 (×3): 40 mg via INTRAVENOUS
  Filled 2019-10-14 (×3): qty 40

## 2019-10-14 MED ORDER — SODIUM CHLORIDE 0.9% FLUSH: 3.0000 mL | Freq: Once | INTRAVENOUS | Status: DC

## 2019-10-14 MED ORDER — OXYCODONE-ACETAMINOPHEN 5-325 MG PO TABS
1.0000 | ORAL_TABLET | Freq: Four times a day (QID) | ORAL | Status: DC | PRN
  Administered 2019-10-14 – 2019-10-15 (×2): 1 via ORAL
  Filled 2019-10-14: qty 1

## 2019-10-14 MED ORDER — ACETAMINOPHEN 650 MG RE SUPP: 650.0000 mg | Freq: Four times a day (QID) | RECTAL | Status: DC | PRN

## 2019-10-14 MED ORDER — PNEUMOCOCCAL VAC POLYVALENT 25 MCG/0.5ML IJ INJ
0.5000 mL | INJECTION | INTRAMUSCULAR | Status: AC
  Administered 2019-10-16: 0.5 mL via INTRAMUSCULAR
  Filled 2019-10-14: qty 0.5

## 2019-10-14 MED ORDER — SIMVASTATIN 20 MG PO TABS
20.0000 mg | ORAL_TABLET | Freq: Every day | ORAL | Status: DC
  Administered 2019-10-14 – 2019-10-15 (×2): 20 mg via ORAL
  Filled 2019-10-14 (×2): qty 1

## 2019-10-14 MED ORDER — PREDNISONE 20 MG PO TABS: 60.0000 mg | ORAL_TABLET | Freq: Every day | ORAL | Status: DC

## 2019-10-14 MED ORDER — GABAPENTIN 400 MG PO CAPS
400.0000 mg | ORAL_CAPSULE | Freq: Three times a day (TID) | ORAL | Status: DC
  Administered 2019-10-14 – 2019-10-16 (×5): 400 mg via ORAL
  Filled 2019-10-14 (×5): qty 1

## 2019-10-14 MED ORDER — CARVEDILOL 12.5 MG PO TABS
12.5000 mg | ORAL_TABLET | Freq: Two times a day (BID) | ORAL | Status: DC
  Administered 2019-10-14: 12.5 mg via ORAL
  Filled 2019-10-14: qty 1

## 2019-10-14 NOTE — ED Notes (Signed)
Attempt to collect labs unsuccessful  

## 2019-10-14 NOTE — H&P (Signed)
History and Physical    [name] SEG:[id number] DOB: [birth year] DOA: 10/14/2019  PCP: [name], MD   Patient coming from: Home  Chief Complaint  Patient presents with  . Abdominal Pain  . Chest Pain      HPI: [name] is a 34 y.o. female with medical history significant for ESRD on HD MWF, history of renal transplant, and anemia ESRD, type 1 diabetes mellitus poorly controlled, hypertension, hyperlipidemia, GERD, gastroparesis, XXX syndrome, recurrent admission ED visit with nausea vomiting abdominal pain presents to the ED for nausea vomiting abdominal pain and chest pain.  Patient was recently discharged on 7/30 after getting treated for nausea vomiting abdominal pain, hypokalemia.  She had again ED visit yesterday but left after triage.  Patient's complaints of intractable nausea and vomiting like her previous gastroparesis.  See had some lower chest pain during episodes of vomiting.  Reports having brown/dark-colored vomiting not sure if it is blood.  Patient denies any diarrhea, fever, chills, shortness of breath.  Currently in the ED she is resting comfortably.  She reports she had no more chest pain.  Abdominal pain is getting better.  ED Course: Vitals are stable saturating well on room air, blood work showed elevated BUN, creatinine troponin elevated 32 > 40, EKG T wave similar QT prolonged 529,Peaking of Twaves- present on previous EKGs. Patient had CT scan of the abdomen pelvis no acute finding or acute process.  Symptoms persistent despite initial management in the ED due to prolonged QTC unable to use multiple nausea medication.  Troponin positive but overall flat and patient is being admitted under observation.  Review of Systems: All systems were reviewed and were negative except as mentioned in HPI above. Negative for fever Negative for diarrhea Negative for shortness of breath  Past Medical History:  Diagnosis Date  . Anemia   . Chronic kidney disease     kidney transplant 07  . Diabetes mellitus    Pt reports diagnosis in June 2011, Type 2  . GERD (gastroesophageal reflux disease)   . Hyperlipidemia   . Hypertension   . 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 Pyelonephritis 06/23/2014  . Seasonal allergies   . UTI (urinary tract infection) 01/09/2015  . 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 ARTERIOVENOUS GRAFT PLACEMENT Bilateral    "neither work" (10/24/2017)  . AV FISTULA PLACEMENT Left 10/26/2018   Procedure: CREATION OF ARTERIOVENOUS FISTULA  LEFT ARM;  Surgeon: [name], MD;  Location: [facility];  Service: Vascular;  Laterality: Left;  . BASCILIC VEIN TRANSPOSITION Left 12/21/2018   Procedure: Left arm BASILIC VEIN TRANSPOSITION SECOND STAGE;  Surgeon: [name], MD;  Location: [facility];  Service: Vascular;  Laterality: Left;  . CHOLECYSTECTOMY N/A 06/30/2017   Procedure: LAPAROSCOPIC CHOLECYSTECTOMY WITH INTRAOPERATIVE CHOLANGIOGRAM;  Surgeon: Excell Seltzer, MD;  Location: [facility];  Service: General;  Laterality: N/A;  . ESOPHAGOGASTRODUODENOSCOPY (EGD) WITH PROPOFOL N/A 07/04/2017   Procedure: ESOPHAGOGASTRODUODENOSCOPY (EGD) WITH PROPOFOL;  Surgeon: Clarene Essex, MD;  Location: [facility];  Service: Endoscopy;  Laterality: N/A;  . INSERTION OF DIALYSIS CATHETER N/A 03/20/2018   Procedure: INSERTION OF TUNNELED DIALYSIS CATHETER - RIGHT INTERANL JUGULAR PLACEMENT;  Surgeon: Angelia Mould, MD;  Location:  [facility];  Service: Vascular;  Laterality: N/A;  . IR GUIDED DRAIN W CATHETER PLACEMENT  10/28/2018  . KIDNEY TRANSPLANT  2007  . PARATHYROIDECTOMY  ?2012   "3/4 removed" (10/24/2017)  . RENAL BIOPSY Bilateral 2003  . UPPER EXTREMITY VENOGRAPHY Bilateral 10/19/2018   Procedure: UPPER EXTREMITY  VENOGRAPHY;  Surgeon: [name], MD;  Location: [facility];  Service: Cardiovascular;  Laterality: Bilateral;  Bilateral      reports that she has never smoked. She has never used smokeless tobacco. She reports that she does not drink alcohol and does not use drugs.  Allergies  Allergen Reactions  . Benadryl [Diphenhydramine-Zinc Acetate] Shortness Of Breath  . Diphenhydramine Hcl Anaphylaxis    REACTION: Stopped breathing in ICU  . Doxycycline Shortness Of Breath  . Motrin [Ibuprofen] Shortness Of Breath and Itching    Per pt  . Banana Other (See Comments)    Sick on the stomach  . Iron Dextran Other (See Comments)    REACTION: vein irritation  . Shellfish Allergy Hives  . Chlorhexidine Itching    Family History  Problem Relation Age of Onset  . Arthritis Mother   . Hypertension Mother   . Aneurysm Mother        died of brain aneurysm  . CAD Father        Has 3 stents  . Diabetes Father        borderline  . Early death Brother        Died in war     Prior to Admission medications   Medication Sig Start Date End Date Taking? Authorizing Provider  ACCU-CHEK SOFTCLIX LANCETS lancets Use to check blood sugar 4 times per day. 12/29/15   Renato Shin, MD  acetaminophen (TYLENOL) 500 MG tablet Take 1,000 mg by mouth every 6 (six) hours as needed for moderate pain (pain).     [provider]  b complex-vitamin c-folic acid (NEPHRO-VITE) 0.8 MG TABS tablet Take 1 tablet by mouth every Monday, Wednesday, and Friday. After dialysis on dialysis days 07/19/18   [provider]  calcium acetate (PHOSLO) 667 MG capsule Take 1,334 mg by mouth 2 (two) times daily. Taking 1 capsule with a snack 11/24/18   [provider]  Calcium Carbonate Antacid (CALCIUM CARBONATE, DOSED IN MG ELEMENTAL CALCIUM,) 1250 MG/5ML SUSP Take 5 mLs by mouth 3 (three) times daily.  11/17/18   [provider]  carvedilol (COREG) 12.5 MG tablet Take 1 tablet (12.5  mg total) by mouth 2 (two) times daily with a meal. 10/12/19   Little Ishikawa, MD  cetirizine (ZYRTEC) 10 MG tablet Take 10 mg by mouth daily as needed for allergies.    [provider]  D 1000 25 MCG (1000 UT) capsule Take 1,000 Units by mouth every other day.  04/27/19   [provider]  diclofenac Sodium (VOLTAREN) 1 % GEL Apply 2 g topically 3 (three) times daily as needed (pain).  04/19/19   [provider]  dicyclomine (BENTYL) 20 MG tablet Take 1 tablet (20 mg total) by mouth 2 (two) times daily as needed (stomach issues.). 06/22/19   Dessa Phi, DO  fluticasone (FLONASE) 50 MCG/ACT nasal spray Place 2 sprays into both nostrils daily as needed for allergies. 09/03/18   [name], MD  gabapentin (NEURONTIN) 400 MG capsule Take 400 mg by mouth 3 (three) times daily. 04/29/19   [provider]  hydrOXYzine (ATARAX/VISTARIL) 50 MG tablet Take 1 tablet (50  mg total) by mouth 2 (two) times daily as needed for itching. 02/06/18   [name], Ripudeep K, MD  insulin aspart (NOVOLOG FLEXPEN) 100 UNIT/ML FlexPen Inject 2-10 Units into the skin 3 (three) times daily with meals. as needed for blood sugar management (sliding scale)    [provider]  insulin degludec (TRESIBA) 100 UNIT/ML SOPN FlexTouch Pen Inject 0.6 mLs (60 Units total) into the skin 2 (two) times daily. Reports taking 24 units QAM Patient taking differently: Inject 20 Units into the skin daily.  03/06/18   Charlynne Cousins, MD  loperamide (IMODIUM) 2 MG capsule Take 2 mg by mouth 4 (four) times daily as needed for diarrhea or loose stools.     [provider]  Methoxy PEG-Epoetin Beta (MIRCERA IJ) Mircera 07/23/19 07/21/20  [provider]  metoCLOPramide (REGLAN) 10 MG tablet Take 1 tablet (10 mg total) by mouth 3 (three) times daily before meals. 06/22/19   Dessa Phi, DO  ondansetron (ZOFRAN ODT) 4 MG disintegrating tablet Take 1 tablet (4 mg total) by mouth every 8  (eight) hours as needed for nausea or vomiting. 06/22/19   Dessa Phi, DO  oxyCODONE-acetaminophen (PERCOCET/ROXICET) 5-325 MG tablet  07/19/19   [provider]  pantoprazole (PROTONIX) 40 MG tablet Take 40 mg by mouth See admin instructions. Once to twice daily    [provider]  predniSONE (DELTASONE) 20 MG tablet Take 60 mg by mouth daily. 07/11/19   [provider]  simvastatin (ZOCOR) 20 MG tablet Take 20 mg by mouth at bedtime.     [provider]  amLODipine (NORVASC) 10 MG tablet Take 1 tablet (10 mg total) by mouth daily. 03/25/18 02/07/19  Thurnell Lose, MD    Physical Exam: Vitals:   10/14/19 1148 10/14/19 1230 10/14/19 1305 10/14/19 1337  BP: (!) 132/96 106/80 (!) 111/95 (!) 130/90  Pulse: (!) 107 (!) 108 101 (!) 106  Resp: 15 14 18 13   Temp:      SpO2: 95% 99% 93% (!) 89%  Weight:      Height:        General exam: AAOX3,, NAD, weak appearing. HEENT:Oral mucosa moist, Ear/Nose WNL grossly, dentition normal. Respiratory system: bilaterally clear breath sounds,no wheezing or crackles,no use of accessory muscle Cardiovascular system: S1 & S2 +, No JVD,. Gastrointestinal system: Abdomen soft, scar present, tender epigastric and midabdomen,ND, BS+ Nervous System:Alert, awake, moving extremities and grossly nonfocal Extremities: No edema, distal peripheral pulses palpable.  Skin: No rashes,no icterus. MSK: Normal muscle bulk,tone, power   Labs on Admission: I have personally reviewed following labs and imaging studies  CBC: Recent Labs  Lab 10/11/19 0148 10/11/19 0740 10/12/19 0648 10/13/19 1711 10/14/19 0454  WBC 4.5 9.8 8.9 11.7* 14.6*  HGB 16.9* 10.9* 10.3* 12.8 12.8  HCT 50.9* 33.5* 32.7* 40.2 38.4  MCV 94.4 95.2 98.5 98.5 96.2  PLT 115* 181 184 249 981   Basic Metabolic Panel: Recent Labs  Lab 10/10/19 1215 10/10/19 1215 10/11/19 0148 10/11/19 0450 10/11/19 0910 10/13/19 1711 10/14/19 0454  NA 136  --  137  --   138 134* 134*  K 4.3   < > 6.8* 7.0* 3.8 4.3 3.9  CL 96*  --  89*  --  95* 92* 87*  CO2 24  --  26  --  27 21* 24  GLUCOSE 271*  --  386*  --  220* 242* 296*  BUN 22*  --  41*  --  24* 39*  56*  CREATININE 5.26*  --  7.49*  --  4.73* 7.38* 8.71*  CALCIUM 10.6*  --  9.9  --  10.9* 8.3* 7.9*  MG  --   --   --   --  2.2  --   --    < > = values in this interval not displayed.   GFR: Estimated Creatinine Clearance: 9.1 mL/min (A) (by C-G formula based on SCr of 8.71 mg/dL (H)). Liver Function Tests: Recent Labs  Lab 10/10/19 1215 10/11/19 0148 10/13/19 1711 10/14/19 0454  AST 21 25 20 16   ALT 18 21 18 17   ALKPHOS 72 71 65 66  BILITOT 0.9 0.8 1.3* 1.5*  PROT 8.9* 9.9* 9.7* 9.8*  ALBUMIN 4.2 4.7 4.7 4.9   Recent Labs  Lab 10/10/19 1215 10/11/19 0148 10/13/19 1711 10/14/19 0454  LIPASE 73* 64* 46 52*   No results for input(s): AMMONIA in the last 168 hours. Coagulation Profile: No results for input(s): INR, PROTIME in the last 168 hours. Cardiac Enzymes: No results for input(s): CKTOTAL, CKMB, CKMBINDEX, TROPONINI in the last 168 hours. BNP (last 3 results) No results for input(s): PROBNP in the last 8760 hours. HbA1C: No results for input(s): HGBA1C in the last 72 hours. CBG: Recent Labs  Lab 10/11/19 2034 10/12/19 0006 10/12/19 0514 10/12/19 0722 10/12/19 1341  GLUCAP 177* 167* 147* 168* 145*   Lipid Profile: No results for input(s): CHOL, HDL, LDLCALC, TRIG, CHOLHDL, LDLDIRECT in the last 72 hours. Thyroid Function Tests: No results for input(s): TSH, T4TOTAL, FREET4, T3FREE, THYROIDAB in the last 72 hours. Anemia Panel: No results for input(s): VITAMINB12, FOLATE, FERRITIN, TIBC, IRON, RETICCTPCT in the last 72 hours. Urine analysis:    Component Value Date/Time   COLORURINE RED (A) 09/03/2018 1118   APPEARANCEUR CLOUDY (A) 09/03/2018 1118   LABSPEC 1.015 09/03/2018 1118   PHURINE 8.0 09/03/2018 1118   GLUCOSEU 250 (A) 09/03/2018 1118   HGBUR LARGE (A)  09/03/2018 1118   HGBUR negative 01/26/2010 1026   BILIRUBINUR NEGATIVE 09/03/2018 1118   BILIRUBINUR NEG 01/01/2011 [place] 09/03/2018 1118   PROTEINUR >300 (A) 09/03/2018 1118   UROBILINOGEN 0.2 01/09/2015 1333   NITRITE POSITIVE (A) 09/03/2018 1118   LEUKOCYTESUR TRACE (A) 09/03/2018 1118    Radiological Exams on Admission: CT ABDOMEN PELVIS WO CONTRAST  Result Date: 10/14/2019 CLINICAL DATA:  Severe mid abd pain x several days.  N/V/D. EXAM: CT ABDOMEN AND PELVIS WITHOUT CONTRAST TECHNIQUE: Multidetector CT imaging of the abdomen and pelvis was performed following the standard protocol without IV contrast. COMPARISON:  10/11/2019 FINDINGS: Lower chest: Clear lung bases. Hepatobiliary: Liver normal in size. There several vague low-attenuation areas in the liver, not well-defined, which could reflect masses or lesions. Gallbladder surgically absent. No bile duct dilation. Pancreas: Unremarkable. No pancreatic ductal dilatation or surrounding inflammatory changes. Spleen: Normal in size without focal abnormality. Adrenals/Urinary Tract: No adrenal masses. No right kidney. Atrophic left kidney. No renal mass. No hydronephrosis. Normal left ureter. Bladder decompressed, otherwise unremarkable. Stomach/Bowel: Normal stomach. Small bowel and colon are normal in caliber. No wall thickening. No inflammation. No evidence of appendicitis. Vascular/Lymphatic: No enlarged lymph nodes. No vascular abnormality. Reproductive: Status post hysterectomy. No adnexal masses. Other: Scarring along the right inguinal region consistent with previous inguinal herniorrhaphy, stable from the prior study. No current hernia. No ascites. Musculoskeletal: No acute or significant osseous findings. IMPRESSION: 1. No acute findings within the abdomen or pelvis. 2. Vague areas of hypoattenuation noted in  the liver which could reflect masses/lesion. These would be best evaluated liver MRI without and with contrast,  or alternatively with contrast enhanced CT. 3. Status post cholecystectomy and hysterectomy well as changes consistent with a prior right inguinal herniorrhaphy. Electronically Signed   By: Lajean Manes M.D.   On: 10/14/2019 08:44   DG Chest 2 View  Result Date: 10/14/2019 CLINICAL DATA:  Chest pain and vomiting for several days EXAM: CHEST - 2 VIEW COMPARISON:  06/19/2018 FINDINGS: Cardiac shadow is within normal limits. The lungs are clear bilaterally. No sizable effusion is seen. No bony abnormality is noted. IMPRESSION: No acute abnormality noted. Electronically Signed   By: Inez Catalina M.D.   On: 10/14/2019 03:39     Assessment/Plan  Intractable nausea vomiting abdominal pain chest pain: Suspect symptoms related due to for her gastroparesis has had recurrent admission ED visits for similar presentation. Due to prolonged QTC-limited nausea medication use. Continue supportive measures Protonix, pain control with oral and IV opiates.  ?Dark-colored vomitus/GI bleeding.  Will check serial H&H avoid heparin.  Add PPI BID. ? mallory weiss tear. reprorts similar prior episodes.  Positive troponin to be flat we will check serial troponin, repeat EKG in the morning.  No known history of CAD .currently has no chest pain.  Earlier had chest pain during vomiting.   Chronic pain with chronic narcotic use   ESRD on HD MWF, consult nephrology in a.m. for dialysis schedule dialysis.  Currently potassium stable  Anemia ESRD: Hemoglobin is stable.  Poorly controlled type 1 diabetes mellitus, last hemoglobin A1c 9.2 06/17/2019.  At home on Tresiba 20 units daily, and sliding scale.  We will continue long-acting and sliding scale insulin and monitor closely.  Essential hypertension: BP is controlled.  Normally on carvedilol at home and will resume  Hyperlipidemia: Continue on statin.  XXX syndrome   Body mass index is 25.82 kg/m.   Severity of Illness: * I certify that at the point of admission it  is my clinical judgment that the patient will require inpatient hospital care spanning less than 2 midnights from the point of admission due to high intensity of service, high risk for further deterioration and high frequency of surveillance required.*    DVT prophylaxis: Place and maintain sequential compression device Start: 10/14/19 1301 Code Status:   Code Status: Full Code  Family Communication: Admission, patients condition and plan of care including tests being ordered have been discussed with the patient  who indicate understanding and agree with the plan and Code Status.  Consults called:   Antonieta Pert MD [facility]  If 7PM-7AM, please contact night-coverage [email or web address]  10/14/2019, 2:16 PM

## 2019-10-14 NOTE — ED Triage Notes (Signed)
Patient is complaining of mid chest pain and abdominal pain that started Friday. Patient had labs done at [facility]. Patient is complaining of n/d/d. Patient has dialysis on MWF.

## 2019-10-14 NOTE — ED Notes (Signed)
Pt states that she is on dialysis and has difficulty urinating therefore, will have a hard time obtaining a urine sample.

## 2019-10-14 NOTE — ED Provider Notes (Signed)
[facility] Emergency Department Provider Note MRN:  [id number]  Arrival date & time: 10/14/19     Chief Complaint   Abdominal Pain and Chest Pain   History of Present Illness   [name] is a 34 y.o. year-old female with a history of CKD, kidney transplant presenting to the ED with chief complaint of abdominal pain.  Location: Diffuse abdominal pain Duration: Several days Onset: Gradual Timing: Constant Description: Total Severity: Moderate to severe Exacerbating/Alleviating Factors: None Associated Symptoms: Nausea, vomiting, occasional blood in the vomit.  Pain occasionally radiates to the chest. Pertinent Negatives: No diarrhea, no fever, no numbness or weakness   Review of Systems  A complete 10 system review of systems was obtained and all systems are negative except as noted in the HPI and PMH.   Patient's Health History    Past Medical History:  Diagnosis Date  . Anemia   . Chronic kidney disease    kidney transplant 07  . Diabetes mellitus    Pt reports diagnosis in June 2011, Type 2  . GERD (gastroesophageal reflux disease)   . Hyperlipidemia   . Hypertension   . 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 Pyelonephritis 06/23/2014  . Seasonal allergies   . UTI (urinary tract infection) 01/09/2015  . 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 ARTERIOVENOUS GRAFT PLACEMENT Bilateral    "neither work" (10/24/2017)  . AV FISTULA PLACEMENT Left 10/26/2018   Procedure: CREATION OF ARTERIOVENOUS FISTULA  LEFT ARM;  Surgeon: [name], MD;  Location: [place];  Service: Vascular;  Laterality: Left;  . BASCILIC VEIN TRANSPOSITION Left 12/21/2018   Procedure: Left arm BASILIC VEIN TRANSPOSITION SECOND STAGE;  Surgeon:  [name], MD;  Location: [place];  Service: Vascular;  Laterality: Left;  . CHOLECYSTECTOMY N/A 06/30/2017   Procedure: LAPAROSCOPIC CHOLECYSTECTOMY WITH INTRAOPERATIVE CHOLANGIOGRAM;  Surgeon: Excell Seltzer, MD;  [facility];  Service: General;  Laterality: N/A;  . ESOPHAGOGASTRODUODENOSCOPY (EGD) WITH PROPOFOL N/A 07/04/2017   Procedure: ESOPHAGOGASTRODUODENOSCOPY (EGD) WITH PROPOFOL;  Surgeon: Clarene Essex, MD;  [facility];  Service: Endoscopy;  Laterality: N/A;  . INSERTION OF DIALYSIS CATHETER N/A 03/20/2018   Procedure: INSERTION OF TUNNELED DIALYSIS CATHETER - RIGHT INTERANL JUGULAR PLACEMENT;  Surgeon: Angelia Mould, MD;  Location: [place];  Service: Vascular;  Laterality: N/A;  . IR GUIDED [place]  10/28/2018  . KIDNEY TRANSPLANT  2007  . PARATHYROIDECTOMY  ?2012   "3/4 removed" (10/24/2017)  . RENAL BIOPSY Bilateral 2003  . UPPER EXTREMITY VENOGRAPHY Bilateral 10/19/2018   Procedure: UPPER EXTREMITY VENOGRAPHY;  Surgeon: [name], MD;  Location: [place] [facility];  Service: Cardiovascular;  Laterality: Bilateral;  Bilateral     Family History  Problem Relation Age of Onset  . Arthritis Mother   . Hypertension Mother   . Aneurysm Mother        died of brain aneurysm  . CAD Father        Has 3 stents  . Diabetes Father        borderline  . Early death Brother        Died in war    Social History   Socioeconomic History  .  Marital status: Single    Spouse name: Not on file  . Number of children: Not on file  . Years of education: Not on file  . Highest education level: Not on file  Occupational History  . 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 Smoking status: Never Smoker  . Smokeless tobacco: Never Used  Vaping Use  . Vaping Use: Never used  Substance and Sexual Activity  . Alcohol use: No  . Drug use: No  . Sexual activity: Yes    Birth  control/protection: None  Other Topics Concern  . Not on file  Social History Narrative   Patient reports that she is single, she is employed at the [employer]   No alcohol tobacco or drug use   Social Determinants of Radio broadcast assistant Strain:   . Difficulty of Paying Living Expenses:   Food Insecurity:   . Worried About Charity fundraiser in the Last Year:   . Arboriculturist in the Last Year:   Transportation Needs:   . Film/video editor (Medical):   Marland Kitchen Lack of Transportation (Non-Medical):   Physical Activity:   . Days of Exercise per Week:   . Minutes of Exercise per Session:   Stress:   . Feeling of Stress :   Social Connections:   . Frequency of Communication with Friends and Family:   . Frequency of Social Gatherings with Friends and Family:   . Attends Religious Services:   . Active Member of Clubs or Organizations:   . Attends Archivist Meetings:   Marland Kitchen Marital Status:   Intimate Partner Violence:   . Fear of Current or Ex-Partner:   . Emotionally Abused:   Marland Kitchen Physically Abused:   . Sexually Abused:      Physical Exam   Vitals:   10/14/19 1029 10/14/19 1110  BP: (!) 137/109 (!) 133/92  Pulse: 104 (!) 109  Resp: (!) 11 16  Temp:    SpO2: (!) 86% 99%    CONSTITUTIONAL: Well-appearing, in moderate distress due to discomfort NEURO:  Alert and oriented x 3, no focal deficits EYES:  eyes equal and reactive ENT/NECK:  no LAD, no JVD CARDIO: Tachycardic rate, well-perfused, normal S1 and S2 PULM:  CTAB no wheezing or rhonchi GI/GU:  normal bowel sounds, non-distended, non-tender MSK/SPINE:  No gross deformities, no edema SKIN:  no rash, atraumatic PSYCH:  Appropriate speech and behavior  *Additional and/or pertinent findings included in MDM below  Diagnostic and Interventional Summary    EKG Interpretation  Date/Time:  Sunday October 14 2019 02:13:46 EDT Ventricular Rate:  122 PR Interval:    QRS Duration: 86 QT  Interval:  371 QTC Calculation: 529 R Axis:   80 Text Interpretation: Sinus tachycardia Probable left atrial enlargement Prolonged QT interval Since last tracing 12 Oct 2019 peaking of T waves Confirmed by Rolland Porter [phone]) on 10/14/2019 3:38:21 AM      Labs Reviewed  LIPASE, BLOOD - Abnormal; Notable for the following components:      Result Value   Lipase 52 (*)    All other components within normal limits  COMPREHENSIVE METABOLIC PANEL - Abnormal; Notable for the following components:   Sodium 134 (*)    Chloride 87 (*)    Glucose, Bld 296 (*)    BUN 56 (*)    Creatinine, Ser 8.71 (*)    Calcium 7.9 (*)  Total Protein 9.8 (*)    Total Bilirubin 1.5 (*)    GFR calc non Af Amer 5 (*)    GFR calc Af Amer 6 (*)    Anion gap 23 (*)    All other components within normal limits  CBC - Abnormal; Notable for the following components:   WBC 14.6 (*)    All other components within normal limits  TROPONIN I (HIGH SENSITIVITY) - Abnormal; Notable for the following components:   Troponin I (High Sensitivity) 32 (*)    All other components within normal limits  TROPONIN I (HIGH SENSITIVITY) - Abnormal; Notable for the following components:   Troponin I (High Sensitivity) 40 (*)    All other components within normal limits  SARS CORONAVIRUS 2 BY RT PCR (HOSPITAL ORDER, [place] [facility])  HCG, QUANTITATIVE, PREGNANCY  URINALYSIS, ROUTINE W REFLEX MICROSCOPIC    CT ABDOMEN PELVIS WO CONTRAST  Final Result    DG Chest 2 View  Final Result      Medications  sodium chloride (PF) 0.9 % injection (has no administration in time range)  HYDROmorphone (DILAUDID) injection 1 mg (has no administration in time range)  HYDROmorphone (DILAUDID) injection 1 mg (1 mg Intravenous Given 10/14/19 0741)  sodium chloride 0.9 % bolus 1,000 mL (1,000 mLs Intravenous New Bag/Given 10/14/19 0742)  iohexol (OMNIPAQUE) 300 MG/ML solution 100 mL (100 mLs Intravenous Contrast Given 10/14/19  0748)     Procedures  /  Critical Care Procedures  ED Course and Medical Decision Making  I have reviewed the triage vital signs, the nursing notes, and pertinent available records from the EMR.  Listed above are laboratory and imaging tests that I personally ordered, reviewed, and interpreted and then considered in my medical decision making (see below for details).      Multiple ED visits in the past for abdominal pain, seems to be related to gastroparesis or functional pain.  However patient has multiple comorbidities and has extensive surgical abdominal history, states pain is worse today, clearly uncomfortable on exam.  Will obtain repeat CT abdomen to exclude obstruction or acute process.  Work-up reveals mildly elevated troponin and repeat shows mild rising, going from 32-40.  Patient did have chest pain during this event.  She continues to experience significant nausea and diffuse abdominal pain.  CT imaging without acute process.  Will request admission to hospital service for symptomatic control and trending of troponins.  Barth Kirks. Sedonia Small, [facility] [email or web address]  Final Clinical Impressions(s) / ED Diagnoses     ICD-10-CM   1. Chest pain, unspecified type  R07.9   2. Gastroparesis  K31.84   3. Elevated troponin  R77.8     ED Discharge Orders    None       Discharge Instructions Discussed with and Provided to Patient:   Discharge Instructions   None       Maudie Flakes, MD 10/14/19 1119

## 2019-10-14 NOTE — ED Notes (Signed)
ED TO INPATIENT HANDOFF REPORT  ED Nurse Name and Phone #: [phone]  S Name/Age/Gender [name] 34 y.o. female Room/Bed: WA16/WA16  Code Status   Code Status: Full Code  Home/SNF/Other Home Patient oriented to: self, place, time and situation Is this baseline? Yes   Triage Complete: Triage complete  Chief Complaint Pain in the abdomen [R10.9]  Triage Note Patient is complaining of mid chest pain and abdominal pain that started Friday. [facility]. Patient is complaining of n/d/d. Patient has dialysis on MWF.    Allergies Allergies  Allergen Reactions  . Benadryl [Diphenhydramine-Zinc Acetate] Anaphylaxis    Stopped breathing in ICU  . Doxycycline Shortness Of Breath  . Motrin [Ibuprofen] Shortness Of Breath and Itching    Per pt  . Banana Other (See Comments)    Sick on the stomach  . Iron Dextran Other (See Comments)    REACTION: vein irritation  . Shellfish Allergy Hives  . Chlorhexidine Itching    Level of Care/Admitting Diagnosis ED Disposition    ED Disposition Condition [facility] Area: [place] [[id number]]  Level of Care: Telemetry Cardiac [103]  I expect the patient will be discharged within 24 hours: No (not a candidate for 5C-Observation unit)  Covid Evaluation: Asymptomatic Screening Protocol (No Symptoms)  Diagnosis: Pain in the abdomen [277117]  Admitting Physician: Antonieta Pert [[id number]]  Attending Physician: Antonieta Pert [[id number]]       B Medical/Surgery History Past Medical History:  Diagnosis Date  . Anemia   . Chronic kidney disease    kidney transplant 07  . Diabetes mellitus    Pt reports diagnosis in June 2011, Type 2  . GERD (gastroesophageal reflux disease)   . Hyperlipidemia   . Hypertension   . 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  Pyelonephritis 06/23/2014  . Seasonal allergies   . UTI (urinary tract infection) 01/09/2015  . 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 ARTERIOVENOUS GRAFT PLACEMENT Bilateral    "neither work" (10/24/2017)  . AV FISTULA PLACEMENT Left 10/26/2018   Procedure: CREATION OF ARTERIOVENOUS FISTULA  LEFT ARM;  Surgeon: [name], MD;  Location: [place];  Service: Vascular;  Laterality: Left;  . BASCILIC VEIN TRANSPOSITION Left 12/21/2018   Procedure: Left arm BASILIC VEIN TRANSPOSITION SECOND STAGE;  Surgeon: [name], MD;  Location: [place];  Service: Vascular;  Laterality: Left;  . CHOLECYSTECTOMY N/A 06/30/2017   Procedure: LAPAROSCOPIC CHOLECYSTECTOMY WITH INTRAOPERATIVE CHOLANGIOGRAM;  Surgeon: Excell Seltzer, MD;  [facility];  Service: General;  Laterality: N/A;  . ESOPHAGOGASTRODUODENOSCOPY (EGD) WITH PROPOFOL N/A 07/04/2017   Procedure: ESOPHAGOGASTRODUODENOSCOPY (EGD) WITH PROPOFOL;  Surgeon: Clarene Essex, MD;  [facility];  Service: Endoscopy;  Laterality: N/A;  . INSERTION OF DIALYSIS CATHETER N/A 03/20/2018   Procedure: INSERTION OF TUNNELED DIALYSIS CATHETER - RIGHT INTERANL JUGULAR PLACEMENT;  Surgeon: Angelia Mould, MD;  Location: [place];  Service: Vascular;  Laterality: N/A;  . IR GUIDED [place]  10/28/2018  . KIDNEY TRANSPLANT  2007  . PARATHYROIDECTOMY  ?2012   "3/4 removed" (10/24/2017)  . RENAL BIOPSY Bilateral 2003  . UPPER EXTREMITY VENOGRAPHY Bilateral 10/19/2018   Procedure: UPPER  EXTREMITY VENOGRAPHY;  Surgeon: [name], MD;  Location: [place] [facility];  Service: Cardiovascular;  Laterality: Bilateral;  Bilateral      A IV Location/Drains/Wounds Patient Lines/Drains/Airways Status    Active Line/Drains/Airways    Name Placement date Placement time Site Days   Peripheral IV 10/14/19 Right;Upper Arm 10/14/19   0454  Arm  less than 1   Fistula / Graft Left Upper arm Arteriovenous vein graft --  --  Upper arm            Intake/Output Last 24 hours No intake or output data in the 24 hours ending 10/14/19 1516  Labs/Imaging Results for orders placed or performed during the hospital encounter of 10/14/19 (from the past 48 hour(s))  Lipase, blood     Status: Abnormal   Collection Time: 10/14/19  4:54 AM  Result Value Ref Range   Lipase 52 (H) 11 - 51 U/L    Comment: Performed at [facility], [place] [address], [place], [place] [address]  Comprehensive metabolic panel     Status: Abnormal   Collection Time: 10/14/19  4:54 AM  Result Value Ref Range   Sodium 134 (L) 135 - 145 mmol/L   Potassium 3.9 3.5 - 5.1 mmol/L   Chloride 87 (L) 98 - 111 mmol/L   CO2 24 22 - 32 mmol/L   Glucose, Bld 296 (H) 70 - 99 mg/dL    Comment: Glucose reference range applies only to samples taken after fasting for at least 8 hours.   BUN 56 (H) 6 - 20 mg/dL   Creatinine, Ser 8.71 (H) 0.44 - 1.00 mg/dL   Calcium 7.9 (L) 8.9 - 10.3 mg/dL   Total Protein 9.8 (H) 6.5 - 8.1 g/dL   Albumin 4.9 3.5 - 5.0 g/dL   AST 16 15 - 41 U/L   ALT 17 0 - 44 U/L   Alkaline Phosphatase 66 38 - 126 U/L   Total Bilirubin 1.5 (H) 0.3 - 1.2 mg/dL   GFR calc non Af Amer 5 (L) >60 mL/min   GFR calc Af Amer 6 (L) >60 mL/min   Anion gap 23 (H) 5 - 15    Comment: Performed at [facility], [place] [address], [place], [place] [address]  CBC     Status: Abnormal   Collection Time: 10/14/19  4:54 AM  Result Value Ref Range   WBC 14.6 (H) 4.0 - 10.5 K/uL   RBC 3.99 3.87 - 5.11 MIL/uL   Hemoglobin 12.8 12.0 - 15.0 g/dL   HCT 38.4 36 - 46 %   MCV 96.2 80.0 - 100.0 fL   MCH 32.1 26.0 - 34.0 pg   MCHC 33.3 30.0 - 36.0 g/dL   RDW 15.0 11.5 - 15.5 %   Platelets 246 150 - 400 K/uL   nRBC 0.0 0.0 - 0.2 %    Comment: Performed at [facility], [place] [address], [place],  [address]  hCG,  quantitative, pregnancy     Status: None   Collection Time: 10/14/19  4:54 AM  Result Value Ref Range   hCG, Beta Chain, Quant, S 4 <5 mIU/mL    Comment:          GEST. AGE      CONC.  (mIU/mL)   <=1 WEEK        5 - 50     2 WEEKS       50 - 500     3  WEEKS       100 - 10,000     4 WEEKS     1,000 - 30,000     5 WEEKS     3,500 - 115,000   6-8 WEEKS     12,000 - 270,000    12 WEEKS     15,000 - 220,000        FEMALE AND NON-PREGNANT FEMALE:     LESS THAN 5 mIU/mL Performed at [facility], [place] [address] [place], [place] [address]   Troponin I (High Sensitivity)     Status: Abnormal   Collection Time: 10/14/19  4:54 AM  Result Value Ref Range   Troponin I (High Sensitivity) 32 (H) <18 ng/L    Comment: (NOTE) Elevated high sensitivity troponin I (hsTnI) values and significant  changes across serial measurements may suggest ACS but many other  chronic and acute conditions are known to elevate hsTnI results.  Refer to the "Links" section for chest pain algorithms and additional  guidance. Performed at [facility], [place] [address], [place], [place] [address]   Troponin I (High Sensitivity)     Status: Abnormal   Collection Time: 10/14/19  7:44 AM  Result Value Ref Range   Troponin I (High Sensitivity) 40 (H) <18 ng/L    Comment: (NOTE) Elevated high sensitivity troponin I (hsTnI) values and significant  changes across serial measurements may suggest ACS but many other  chronic and acute conditions are known to elevate hsTnI results.  Refer to the "Links" section for chest pain algorithms and additional  guidance. Performed at [facility], [place] [address], [place], [place] [address]   SARS Coronavirus 2 by RT PCR (hospital order, performed in [facility] hospital lab) Nasopharyngeal Nasopharyngeal Swab     Status: None   Collection Time: 10/14/19 11:55 AM   Specimen: Nasopharyngeal Swab  Result Value Ref Range   SARS  Coronavirus 2 NEGATIVE NEGATIVE    Comment: (NOTE) SARS-CoV-2 target nucleic acids are NOT DETECTED.  The SARS-CoV-2 RNA is generally detectable in upper and lower respiratory specimens during the acute phase of infection. The lowest concentration of SARS-CoV-2 viral copies this assay can detect is 250 copies / mL. A negative result does not preclude SARS-CoV-2 infection and should not be used as the sole basis for treatment or other patient management decisions.  A negative result may occur with improper specimen collection / handling, submission of specimen other than nasopharyngeal swab, presence of viral mutation(s) within the areas targeted by this assay, and inadequate number of viral copies (<250 copies / mL). A negative result must be combined with clinical observations, patient history, and epidemiological information.  Fact Sheet for Patients:   StrictlyIdeas.no  Fact Sheet for Healthcare Providers: BankingDealers.co.za  This test is not yet approved or  cleared by the [place] FDA and has been authorized for detection and/or diagnosis of SARS-CoV-2 by FDA under an Emergency Use Authorization (EUA).  This EUA will remain in effect (meaning this test can be used) for the duration of the COVID-19 declaration under Section 564(b)(1) of the Act, 21 U.S.C. section 360bbb-3(b)(1), unless the authorization is terminated or revoked sooner.  Performed at [facility], [place] [address], [place], [place] [address]    CT ABDOMEN PELVIS WO CONTRAST  Result Date: 10/14/2019 CLINICAL DATA:  Severe mid abd pain x several days.  N/V/D. EXAM: CT ABDOMEN AND PELVIS WITHOUT CONTRAST TECHNIQUE: Multidetector CT imaging of the abdomen  and pelvis was performed following the standard protocol without IV contrast. COMPARISON:  10/11/2019 FINDINGS: Lower chest: Clear lung bases. Hepatobiliary: Liver normal in size. There several  vague low-attenuation areas in the liver, not well-defined, which could reflect masses or lesions. Gallbladder surgically absent. No bile duct dilation. Pancreas: Unremarkable. No pancreatic ductal dilatation or surrounding inflammatory changes. Spleen: Normal in size without focal abnormality. Adrenals/Urinary Tract: No adrenal masses. No right kidney. Atrophic left kidney. No renal mass. No hydronephrosis. Normal left ureter. Bladder decompressed, otherwise unremarkable. Stomach/Bowel: Normal stomach. Small bowel and colon are normal in caliber. No wall thickening. No inflammation. No evidence of appendicitis. Vascular/Lymphatic: No enlarged lymph nodes. No vascular abnormality. Reproductive: Status post hysterectomy. No adnexal masses. Other: Scarring along the right inguinal region consistent with previous inguinal herniorrhaphy, stable from the prior study. No current hernia. No ascites. Musculoskeletal: No acute or significant osseous findings. IMPRESSION: 1. No acute findings within the abdomen or pelvis. 2. Vague areas of hypoattenuation noted in the liver which could reflect masses/lesion. These would be best evaluated liver MRI without and with contrast, or alternatively with contrast enhanced CT. 3. Status post cholecystectomy and hysterectomy well as changes consistent with a prior right inguinal herniorrhaphy. Electronically Signed   By: Lajean Manes M.D.   On: 10/14/2019 08:44   DG Chest 2 View  Result Date: 10/14/2019 CLINICAL DATA:  Chest pain and vomiting for several days EXAM: CHEST - 2 VIEW COMPARISON:  06/19/2018 FINDINGS: Cardiac shadow is within normal limits. The lungs are clear bilaterally. No sizable effusion is seen. No bony abnormality is noted. IMPRESSION: No acute abnormality noted. Electronically Signed   By: Inez Catalina M.D.   On: 10/14/2019 03:39    Pending Labs Unresulted Labs (From admission, onward) Comment          Start     Ordered   10/15/19 8185  Basic metabolic  panel  Tomorrow morning,   R        10/14/19 1302   10/15/19 0500  CBC  Tomorrow morning,   R        10/14/19 1302   10/14/19 1417  Hemoglobin and hematocrit, blood  Every 6 hours,   R (with STAT occurrences)      10/14/19 1416   10/14/19 1300  CBC  (heparin)  Once,   STAT       Comments: Baseline for heparin therapy IF NOT ALREADY DRAWN.  Notify MD if PLT < 100 K.    10/14/19 1302   10/14/19 1300  Creatinine, serum  (heparin)  Once,   STAT       Comments: Baseline for heparin therapy IF NOT ALREADY DRAWN.    10/14/19 1302   10/14/19 0156  Urinalysis, Routine w reflex microscopic  ONCE - STAT,   STAT        10/14/19 0155          Vitals/Pain Today's Vitals   10/14/19 1337 10/14/19 1424 10/14/19 1508 10/14/19 1508  BP: (!) 130/90 (!) 125/89  (!) 119/90  Pulse: (!) 106 102  82  Resp: 13 13  16   Temp:    (!) 97.5 F (36.4 C)  TempSrc:    Oral  SpO2: (!) 89% 91%  100%  Weight:      Height:      PainSc:   10-Worst pain ever     Isolation Precautions No active isolations  Medications Medications  sodium chloride (PF) 0.9 % injection (has no administration in time  range)  acetaminophen (TYLENOL) tablet 650 mg (has no administration in time range)    Or  acetaminophen (TYLENOL) suppository 650 mg (has no administration in time range)  HYDROmorphone (DILAUDID) injection 0.5 mg (0.5 mg Intravenous Given 10/14/19 1508)  pantoprazole (PROTONIX) injection 40 mg (40 mg Intravenous Given 10/14/19 1507)  insulin aspart (novoLOG) injection 0-6 Units (has no administration in time range)  carvedilol (COREG) tablet 12.5 mg (has no administration in time range)  calcium carbonate (dosed in mg elemental calcium) suspension 500 mg of elemental calcium (has no administration in time range)  dicyclomine (BENTYL) tablet 20 mg (has no administration in time range)  fluticasone (FLONASE) 50 MCG/ACT nasal spray 2 spray (has no administration in time range)  gabapentin (NEURONTIN) capsule 400 mg  (has no administration in time range)  hydrOXYzine (ATARAX/VISTARIL) tablet 50 mg (has no administration in time range)  insulin degludec (TRESIBA) 100 UNIT/ML FlexTouch Pen 10 Units (has no administration in time range)  oxyCODONE-acetaminophen (PERCOCET/ROXICET) 5-325 MG per tablet 1 tablet (has no administration in time range)  predniSONE (DELTASONE) tablet 60 mg (has no administration in time range)  simvastatin (ZOCOR) tablet 20 mg (has no administration in time range)  HYDROmorphone (DILAUDID) injection 1 mg (1 mg Intravenous Given 10/14/19 0741)  sodium chloride 0.9 % bolus 1,000 mL (0 mLs Intravenous Stopped 10/14/19 1154)  iohexol (OMNIPAQUE) 300 MG/ML solution 100 mL (100 mLs Intravenous Contrast Given 10/14/19 0748)  HYDROmorphone (DILAUDID) injection 1 mg (1 mg Intravenous Given 10/14/19 1154)    Mobility walks Low fall risk   Focused Assessments .   R Recommendations: See Admitting Provider Note  Report given to:   Additional Notes: n/a

## 2019-10-15 DIAGNOSIS — Z881 Allergy status to other antibiotic agents status: Secondary | ICD-10-CM | POA: Diagnosis not present

## 2019-10-15 DIAGNOSIS — Z20822 Contact with and (suspected) exposure to covid-19: Secondary | ICD-10-CM | POA: Diagnosis present

## 2019-10-15 DIAGNOSIS — G8929 Other chronic pain: Secondary | ICD-10-CM | POA: Diagnosis present

## 2019-10-15 DIAGNOSIS — Z8261 Family history of arthritis: Secondary | ICD-10-CM | POA: Diagnosis not present

## 2019-10-15 DIAGNOSIS — K920 Hematemesis: Secondary | ICD-10-CM | POA: Diagnosis present

## 2019-10-15 DIAGNOSIS — Z992 Dependence on renal dialysis: Secondary | ICD-10-CM

## 2019-10-15 DIAGNOSIS — R778 Other specified abnormalities of plasma proteins: Secondary | ICD-10-CM | POA: Diagnosis present

## 2019-10-15 DIAGNOSIS — N2581 Secondary hyperparathyroidism of renal origin: Secondary | ICD-10-CM | POA: Diagnosis present

## 2019-10-15 DIAGNOSIS — D631 Anemia in chronic kidney disease: Secondary | ICD-10-CM | POA: Diagnosis present

## 2019-10-15 DIAGNOSIS — Z79891 Long term (current) use of opiate analgesic: Secondary | ICD-10-CM | POA: Diagnosis not present

## 2019-10-15 DIAGNOSIS — I959 Hypotension, unspecified: Secondary | ICD-10-CM | POA: Diagnosis present

## 2019-10-15 DIAGNOSIS — Z888 Allergy status to other drugs, medicaments and biological substances status: Secondary | ICD-10-CM | POA: Diagnosis not present

## 2019-10-15 DIAGNOSIS — I120 Hypertensive chronic kidney disease with stage 5 chronic kidney disease or end stage renal disease: Secondary | ICD-10-CM | POA: Diagnosis present

## 2019-10-15 DIAGNOSIS — Z91018 Allergy to other foods: Secondary | ICD-10-CM | POA: Diagnosis not present

## 2019-10-15 DIAGNOSIS — K3184 Gastroparesis: Secondary | ICD-10-CM | POA: Diagnosis present

## 2019-10-15 DIAGNOSIS — N186 End stage renal disease: Secondary | ICD-10-CM

## 2019-10-15 DIAGNOSIS — E1043 Type 1 diabetes mellitus with diabetic autonomic (poly)neuropathy: Secondary | ICD-10-CM | POA: Diagnosis present

## 2019-10-15 DIAGNOSIS — E785 Hyperlipidemia, unspecified: Secondary | ICD-10-CM | POA: Diagnosis present

## 2019-10-15 DIAGNOSIS — Y830 Surgical operation with transplant of whole organ as the cause of abnormal reaction of the patient, or of later complication, without mention of misadventure at the time of the procedure: Secondary | ICD-10-CM | POA: Diagnosis present

## 2019-10-15 DIAGNOSIS — K219 Gastro-esophageal reflux disease without esophagitis: Secondary | ICD-10-CM | POA: Diagnosis present

## 2019-10-15 DIAGNOSIS — T781XXA Other adverse food reactions, not elsewhere classified, initial encounter: Secondary | ICD-10-CM | POA: Diagnosis present

## 2019-10-15 DIAGNOSIS — Q970 Karyotype 47, XXX: Secondary | ICD-10-CM | POA: Diagnosis not present

## 2019-10-15 DIAGNOSIS — Z9049 Acquired absence of other specified parts of digestive tract: Secondary | ICD-10-CM | POA: Diagnosis not present

## 2019-10-15 DIAGNOSIS — T8612 Kidney transplant failure: Secondary | ICD-10-CM | POA: Diagnosis present

## 2019-10-15 DIAGNOSIS — Z23 Encounter for immunization: Secondary | ICD-10-CM | POA: Diagnosis present

## 2019-10-15 DIAGNOSIS — R112 Nausea with vomiting, unspecified: Secondary | ICD-10-CM | POA: Diagnosis not present

## 2019-10-15 LAB — GLUCOSE, CAPILLARY
Glucose-Capillary: 200 mg/dL — ABNORMAL HIGH (ref 70–99)
Glucose-Capillary: 232 mg/dL — ABNORMAL HIGH (ref 70–99)
Glucose-Capillary: 155 mg/dL — ABNORMAL HIGH (ref 70–99)

## 2019-10-15 LAB — CBC
HCT: 30 % — ABNORMAL LOW (ref 36.0–46.0)
Hemoglobin: 9.8 g/dL — ABNORMAL LOW (ref 12.0–15.0)
MCH: 32.1 pg (ref 26.0–34.0)
MCHC: 32.7 g/dL (ref 30.0–36.0)
MCV: 98.4 fL (ref 80.0–100.0)
Platelets: 174 10*3/uL (ref 150–400)
RBC: 3.05 MIL/uL — ABNORMAL LOW (ref 3.87–5.11)
RDW: 15.3 % (ref 11.5–15.5)
WBC: 9 10*3/uL (ref 4.0–10.5)
nRBC: 0 % (ref 0.0–0.2)

## 2019-10-15 LAB — CBC WITH DIFFERENTIAL/PLATELET
Abs Immature Granulocytes: 0.02 10*3/uL (ref 0.00–0.07)
Basophils Absolute: 0 10*3/uL (ref 0.0–0.1)
Basophils Relative: 0 %
Eosinophils Absolute: 0.2 10*3/uL (ref 0.0–0.5)
Eosinophils Relative: 3 %
HCT: 29.3 % — ABNORMAL LOW (ref 36.0–46.0)
Hemoglobin: 9.5 g/dL — ABNORMAL LOW (ref 12.0–15.0)
Immature Granulocytes: 0 %
Lymphocytes Relative: 29 %
Lymphs Abs: 2.2 10*3/uL (ref 0.7–4.0)
MCH: 31.8 pg (ref 26.0–34.0)
MCHC: 32.4 g/dL (ref 30.0–36.0)
MCV: 98 fL (ref 80.0–100.0)
Monocytes Absolute: 0.5 10*3/uL (ref 0.1–1.0)
Monocytes Relative: 7 %
Neutro Abs: 4.5 10*3/uL (ref 1.7–7.7)
Neutrophils Relative %: 61 %
Platelets: 167 10*3/uL (ref 150–400)
RBC: 2.99 MIL/uL — ABNORMAL LOW (ref 3.87–5.11)
RDW: 15.2 % (ref 11.5–15.5)
WBC: 7.5 10*3/uL (ref 4.0–10.5)
nRBC: 0 % (ref 0.0–0.2)

## 2019-10-15 LAB — BASIC METABOLIC PANEL
Anion gap: 16 — ABNORMAL HIGH (ref 5–15)
BUN: 68 mg/dL — ABNORMAL HIGH (ref 6–20)
CO2: 23 mmol/L (ref 22–32)
Calcium: 6.1 mg/dL — CL (ref 8.9–10.3)
Chloride: 92 mmol/L — ABNORMAL LOW (ref 98–111)
Creatinine, Ser: 10.4 mg/dL — ABNORMAL HIGH (ref 0.44–1.00)
GFR calc Af Amer: 5 mL/min — ABNORMAL LOW (ref >60)
GFR calc non Af Amer: 4 mL/min — ABNORMAL LOW (ref >60)
Glucose, Bld: 302 mg/dL — ABNORMAL HIGH (ref 70–99)
Potassium: 3.7 mmol/L (ref 3.5–5.1)
Sodium: 131 mmol/L — ABNORMAL LOW (ref 135–145)

## 2019-10-15 MED ORDER — SODIUM CHLORIDE 0.9 % IV BOLUS
250.0000 mL | Freq: Once | INTRAVENOUS | Status: AC
  Administered 2019-10-15: 250 mL via INTRAVENOUS

## 2019-10-15 MED ORDER — INSULIN GLARGINE 100 UNIT/ML ~~LOC~~ SOLN
10.0000 [IU] | Freq: Every day | SUBCUTANEOUS | Status: DC
  Administered 2019-10-15 – 2019-10-16 (×2): 10 [IU] via SUBCUTANEOUS
  Filled 2019-10-15 (×2): qty 0.1

## 2019-10-15 MED ORDER — SODIUM CHLORIDE 0.9 % IV SOLN: INTRAVENOUS | Status: DC | PRN

## 2019-10-15 MED ORDER — SODIUM CHLORIDE 0.9 % IV SOLN: INTRAVENOUS | Status: DC

## 2019-10-15 MED ORDER — SODIUM CHLORIDE 0.9 % IV BOLUS
500.0000 mL | Freq: Once | INTRAVENOUS | Status: AC
  Administered 2019-10-15: 500 mL via INTRAVENOUS

## 2019-10-15 MED ORDER — PANTOPRAZOLE SODIUM 40 MG PO TBEC
40.0000 mg | DELAYED_RELEASE_TABLET | Freq: Every day | ORAL | Status: DC
  Administered 2019-10-15 – 2019-10-16 (×2): 40 mg via ORAL
  Filled 2019-10-15 (×2): qty 1

## 2019-10-15 MED ORDER — SODIUM CHLORIDE 0.9 % IV BOLUS
1000.0000 mL | Freq: Once | INTRAVENOUS | Status: AC
  Administered 2019-10-15: 1000 mL via INTRAVENOUS

## 2019-10-15 MED ORDER — OXYCODONE-ACETAMINOPHEN 5-325 MG PO TABS
ORAL_TABLET | ORAL | Status: AC
  Filled 2019-10-15: qty 1

## 2019-10-15 MED ORDER — CALCIUM GLUCONATE-NACL 1-0.675 GM/50ML-% IV SOLN
1.0000 g | Freq: Once | INTRAVENOUS | Status: AC
  Administered 2019-10-15: 1000 mg via INTRAVENOUS
  Filled 2019-10-15: qty 50

## 2019-10-15 MED ORDER — CARVEDILOL 3.125 MG PO TABS
3.1250 mg | ORAL_TABLET | Freq: Two times a day (BID) | ORAL | Status: DC
  Administered 2019-10-15: 3.125 mg via ORAL
  Filled 2019-10-15: qty 1

## 2019-10-15 NOTE — Progress Notes (Signed)
   10/14/19 2327  Assess: MEWS Score  Temp 98 F (36.7 C)  BP (!) 89/60  Pulse Rate 83  ECG Heart Rate 86  Resp 13  SpO2 92 %  O2 Device Room Air  Assess: MEWS Score  MEWS Temp 0  MEWS Systolic 1  MEWS Pulse 0  MEWS RR 1  MEWS LOC 0  MEWS Score 2  MEWS Score Color Yellow  Assess: if the MEWS score is Yellow or Red  Were vital signs taken at a resting state? Yes  Focused Assessment No change from prior assessment  Early Detection of Sepsis Score *See Row Information* Low  MEWS guidelines implemented *See Row Information* Yes  Take Vital Signs  Increase Vital Sign Frequency  Yellow: Q 2hr X 2 then Q 4hr X 2, if remains yellow, continue Q 4hrs  Escalate  MEWS: Escalate Yellow: discuss with charge nurse/RN and consider discussing with provider and RRT  Notify: Charge Nurse/RN  Name of Charge Nurse/RN Notified Tressie Ellis, RN  Date Charge Nurse/RN Notified 10/14/19  Time Charge Nurse/RN Notified 2327  Document  Patient Outcome Other (Comment) (Pt stable, had pain medication on day shift and is HD pt)  Progress note created (see row info) Yes

## 2019-10-15 NOTE — Progress Notes (Signed)
PROGRESS NOTE  LORETTE [name]  DOB: 1985-12-01  PCP: [name], MD QMG:[id number]  DOA: 10/14/2019  LOS: 0 days   Chief Complaint  Patient presents with  . Abdominal Pain  . Chest Pain   Brief narrative: [name] is a 34 y.o. female with PMH of ESRD on HD MWF, history of renal transplant, chronic anemia, type 1 diabetes mellitus, hypertension, hyperlipidemia, GERD, gastroparesis, XXX syndrome,  frequent ER visits due to intractable nausea and vomiting mostly secondary to gastroparesis.   Patient presented to ED on 8/1 with complaint of nausea, vomiting, abdominal pain and chest pain.  Patient was hospitalized 7/29-7/30 for similar complaint.  Next day, she came to ED again but left after tryouts.  8/1, she returned again with intractable nausea and vomiting.  She also complained of lower chest pain during vomiting.  She reported brown/dark-colored vomiting but it is not sure if it is blood.    In the ED, hemodynamically stable Blood work showed elevated BUN, creatinine, troponin elevated 32 > 40, EKG T wave similar QT prolonged 529, Peaking of Twaves- present on previous EKGs. CT scan of the abdomen pelvis did not show any acute finding.  Regular intractable symptoms, prolonged QTC, patient was kept under observation.  Subjective: Patient was seen and examined this morning. Pleasant young African-American female.  Taking her breakfast.  Not in distress.  Last vomiting episode was yesterday.  Remains nauseous. Chart reviewed. Blood pressure overnight down to 80s. Labs with calcium level down to 6.1, potassium 3.7, hemoglobin at 9.8  Assessment/Plan: Intractable nausea, vomiting, abdominal pain, chest pain:  -Suspect symptoms related due to for her gastroparesis.  -Has had recurrent admission ED visits for similar presentation. -due to prolonged QTC, many antiemetics are limited.  She states that Zofran works better for her. -Continue supportive measures with Protonix.  I  would avoid IV opioids.  Hypotension -Patient has history of hypertension and is on Coreg at home. -Overnight blood pressure low in 80s.  Keep Coreg on hold. -Give bolus of 250 mL today.  Patient apparently is scheduled for dialysis today. -Nephrology consulted.  Acute anemia -Reported dark-colored vomitus on admission. -Hemoglobin dropped from 12.8 to 9.8 in 24 hours. -Despite ESRD, baseline hemoglobin seems to be more than 11 -No hematemesis or hematochezia reported. -Continue to monitor hemoglobin. -Continue PPI twice daily.  Elevated troponin  -Only mildly elevated at 32  > 40 -EKG without significant changes.   -Patient had Lexiscan in December 2020 which was normal.    Chronic pain with chronic narcotic use   ESRD on HD  -Nephrology consulted.  Routine dialysis MWF -Currently potassium stable  Chronic anemia of ESRD: Hemoglobin is stable.  Poorly controlled type 1 diabetes mellitus,  A1c 9.2 on April 2021.   -At home on Tresiba 20 units daily, and sliding scale.   -Received 10 units of Lantus last night.  Blood sugar remains over 200 this morning.   -I will give 1 more dose of Lantus 10 units this morning and continue titrate up.  Continue sliding scale insulin with Accu-Cheks.  Hyperlipidemia: Continue on statin.  XXX syndrome  Mobility: Encourage ambulation.  Does not use assistive devices at home. Code Status:   Code Status: Full Code  Nutritional status: Body mass index is 28.61 kg/m.     Diet Order            Diet renal/carb modified with fluid restriction Diet-HS Snack? Nothing; Fluid restriction: 1200 mL Fluid; Room service appropriate? Yes;  Fluid consistency: Thin  Diet effective now                 DVT prophylaxis: Place and maintain sequential compression device Start: 10/14/19 1301   Antimicrobials:  None Fluid: None  Consultants: Nephrology Family Communication:  None at bedside  Status is: Observation  The patient will  require care spanning > 2 midnights and should be moved to inpatient because: Ongoing diagnostic testing needed not appropriate for outpatient work up. Need to repeat Hb tomorrow.   Dispo: The patient is from: Home              Anticipated d/c is to: Home              Anticipated d/c date is: 1 day              Patient currently is not medically stable to d/c.   Infusions:  . sodium chloride      Scheduled Meds: . calcium carbonate (dosed in mg elemental calcium)  500 mg of elemental calcium Oral TID  . feeding supplement  1 Container Oral TID BM  . gabapentin  400 mg Oral TID  . insulin aspart  0-6 Units Subcutaneous TID WC  . insulin glargine  10 Units Subcutaneous Daily  . pantoprazole (PROTONIX) IV  40 mg Intravenous Q12H  . pneumococcal 23 valent vaccine  0.5 mL Intramuscular Tomorrow-1000  . simvastatin  20 mg Oral QHS    Antimicrobials: Anti-infectives (From admission, onward)   None      PRN meds: sodium chloride, acetaminophen **OR** acetaminophen, dicyclomine, fluticasone, hydrOXYzine, loperamide, oxyCODONE-acetaminophen   Objective: Vitals:   10/15/19 0709 10/15/19 0816  BP: (!) 83/52 (!) 90/64  Pulse: 84 94  Resp: 16 14  Temp: 98 F (36.7 C)   SpO2: 98% 96%    Intake/Output Summary (Last 24 hours) at 10/15/2019 1035 Last data filed at 10/15/2019 0323 Gross per 24 hour  Intake 1487.67 ml  Output --  Net 1487.67 ml   Filed Weights   10/14/19 0201 10/14/19 1706  Weight: 72.6 kg 80.4 kg   Weight change: 7.825 kg Body mass index is 28.61 kg/m.   Physical Exam: General exam: Appears calm and comfortable.  Not in physical distress Skin: No rashes, lesions or ulcers. HEENT: Atraumatic, normocephalic, supple neck, no obvious bleeding Lungs: Clear to auscultation bilaterally CVS: Regular rate and rhythm, no murmur GI/Abd soft, nontender, nondistended, bowel sound present CNS: Alert, awake, oriented x3 Psychiatry: Mood appropriate Extremities: No pedal  edema, no calf tenderness  Data Review: I have personally reviewed the laboratory data and studies available.  Recent Labs  Lab 10/11/19 0740 10/12/19 0648 10/13/19 1711 10/14/19 0454 10/15/19 0201  WBC 9.8 8.9 11.7* 14.6* 9.0  HGB 10.9* 10.3* 12.8 12.8 9.8*  HCT 33.5* 32.7* 40.2 38.4 30.0*  MCV 95.2 98.5 98.5 96.2 98.4  PLT 181 184 249 246 174   Recent Labs  Lab 10/11/19 0148 10/11/19 0148 10/11/19 0450 10/11/19 0910 10/13/19 1711 10/14/19 0454 10/15/19 0201  NA 137  --   --  138 134* 134* 131*  K 6.8*   < > 7.0* 3.8 4.3 3.9 3.7  CL 89*  --   --  95* 92* 87* 92*  CO2 26  --   --  27 21* 24 23  GLUCOSE 386*  --   --  220* 242* 296* 302*  BUN 41*  --   --  24* 39* 56* 68*  CREATININE 7.49*  --   --  4.73* 7.38* 8.71* 10.40*  CALCIUM 9.9  --   --  10.9* 8.3* 7.9* 6.1*  MG  --   --   --  2.2  --   --   --    < > = values in this interval not displayed.   Lab Results  Component Value Date   HGBA1C 9.2 (H) 06/17/2019       Component Value Date/Time   CHOL 166 10/24/2008 0000   TRIG 238 10/24/2008 0000   HDL 33 10/24/2008 0000   CHOLHDL 5.5 Ratio 09/25/2007 1758   VLDL 48 10/24/2008 0000   LDLCALC 85 10/24/2008 0000   LDLDIRECT 65 01/20/2012 1433    Signed, Terrilee Croak, MD [facility] Pager: [phone] (Secure Chat preferred). 10/15/2019

## 2019-10-15 NOTE — Progress Notes (Signed)
Inpatient Diabetes Program Recommendations  AACE/ADA: New Consensus Statement on Inpatient Glycemic Control (2015)  Target Ranges:  Prepandial:   less than 140 mg/dL      Peak postprandial:   less than 180 mg/dL (1-2 hours)      Critically ill patients:  140 - 180 mg/dL   Results for CRISTY, [name] (MRN [id number]) as of 10/15/2019 07:01  Ref. Range 10/14/2019 17:19 10/14/2019 21:13 10/15/2019 06:41  Glucose-Capillary Latest Ref Range: 70 - 99 mg/dL 239 (H)  2 units NOVOLOG  279 (H)    10 units LANTUS 200 (H)    Admit with: Intractable nausea vomiting/ Abdominal pain/ Chest pain   History: Type 1 DM, ESRD  Home DM Meds: Tresiba 20 units Daily       Novolog 2-10 units TID per SSI  Current Orders: Lantus 10 units QHS      Novolog 0-6 units TID with meals    Just discharged home 07/30 after admission for similar issues.    MD- Note Lantus 10 units QHS started last PM.  CBG 200 this AM.  Please consider increasing Lantus to 15 units QHS for tonight     --Will follow patient during hospitalization--  [name] RN, MSN, CDE Diabetes Coordinator Inpatient Glycemic Control Team Team Pager: [phone] (8a-5p)

## 2019-10-15 NOTE — Progress Notes (Signed)
Critical calcium 6.1.  MD notified and orders placed.  Patient refusing for calcium to be infused through IV.  States "oh no, you're not going to mess up my IV".  Patient educated that they point of the IV is to infuse medications that she needs.  Patient continues to refuse.  Will continue to monitor.

## 2019-10-15 NOTE — Progress Notes (Signed)
°   10/15/19 0709  Vitals  Temp 98 F (36.7 C)  Temp Source Oral  BP (!) 83/52  MAP (mmHg) (!) 63  BP Location Right Arm  BP Method Automatic  Patient Position (if appropriate) Lying  Pulse Rate 84  Pulse Rate Source Monitor  ECG Heart Rate 84  Resp 16  Level of Consciousness  Level of Consciousness Alert  Oxygen Therapy  SpO2 98 %  O2 Device Room Air  Pain Assessment  Pain Scale 0-10  Pain Score 0  POSS Scale (Pasero Opioid Sedation Scale)  POSS *See Group Information* 1-Acceptable,Awake and alert  PCA/Epidural/Spinal Assessment  Respiratory Pattern Regular;Unlabored  Glasgow Coma Scale  Eye Opening 4  Best Verbal Response (NON-intubated) 5  Best Motor Response 6  Glasgow Coma Scale Score 15  MEWS Score  MEWS Temp 0  MEWS Systolic 1  MEWS Pulse 0  MEWS RR 0  MEWS LOC 0  MEWS Score 1  MEWS Score Color Green  paged dr Pietro Cassis regarding low bp, new orders rec'd to give 250cc ns bolus

## 2019-10-15 NOTE — Plan of Care (Signed)

## 2019-10-15 NOTE — Progress Notes (Signed)
Paged MD over low BPs.  This RN, an NT, and 2nd RN were unable to obtain a manual pressure.  Doppler pressure revealed 80.  MD aware, new orders placed.

## 2019-10-15 NOTE — Progress Notes (Signed)
Patient ID: [name], female   DOB: 02-14-1986, 34 y.o.   MRN: [id number]  [facility] Progress Note    Subjective:   [name] is well known to our service due to her history of ESRD 2/2 FSGS, on HD MWF following failed kidney transplant.  She was hospitalized 10/09/19 with abdominal pain associated with N/V.  She was discharged to home on 10/12/19 only to return on 10/14/19 with recurrent abdominal pain.  Please see Consult note dated 10/11/19 for full details.  Today she feels better, however she was restarted on coreg last night and her BP has dropped this morning.  She is asymptomatic at this time.  She has received several boluses of IV NS.   Objective:   BP (!) 90/64   Pulse 94   Temp 98 F (36.7 C) (Oral)   Resp 14   Ht 5\' 6"  (1.676 m)   Wt 80.4 kg   SpO2 96%   BMI 28.61 kg/m   Intake/Output: I/O last 3 completed shifts: In: 1487.7 [P.O.:480; IV Piggyback:1007.7] Out: -    Intake/Output this shift:  No intake/output data recorded. Weight change: 7.825 kg  Physical Exam: Gen: NAD, eating breakfast in bed CVS: RRR Resp: cta Abd: +BS, soft, Nt/Nd Ext: no edema, LUE AVG +T/B  Labs: BMET Recent Labs  Lab 10/10/19 1215 10/11/19 0148 10/11/19 0450 10/11/19 0910 10/13/19 1711 10/14/19 0454 10/15/19 0201  NA 136 137  --  138 134* 134* 131*  K 4.3 6.8* 7.0* 3.8 4.3 3.9 3.7  CL 96* 89*  --  95* 92* 87* 92*  CO2 24 26  --  27 21* 24 23  GLUCOSE 271* 386*  --  220* 242* 296* 302*  BUN 22* 41*  --  24* 39* 56* 68*  CREATININE 5.26* 7.49*  --  4.73* 7.38* 8.71* 10.40*  ALBUMIN 4.2 4.7  --   --  4.7 4.9  --   CALCIUM 10.6* 9.9  --  10.9* 8.3* 7.9* 6.1*   CBC Recent Labs  Lab 10/12/19 0648 10/13/19 1711 10/14/19 0454 10/15/19 0201  WBC 8.9 11.7* 14.6* 9.0  HGB 10.3* 12.8 12.8 9.8*  HCT 32.7* 40.2 38.4 30.0*  MCV 98.5 98.5 96.2 98.4  PLT 184 249 246 174      Medications:    . calcium carbonate (dosed in mg elemental calcium)  500 mg of  elemental calcium Oral TID  . feeding supplement  1 Container Oral TID BM  . gabapentin  400 mg Oral TID  . insulin aspart  0-6 Units Subcutaneous TID WC  . insulin glargine  10 Units Subcutaneous Daily  . pantoprazole (PROTONIX) IV  40 mg Intravenous Q12H  . pneumococcal 23 valent vaccine  0.5 mL Intramuscular Tomorrow-1000  . simvastatin  20 mg Oral QHS   Dialysis Orders: [facility] 4h[facility], [id number], [id number], EDW 83kg,2K/3.5CaP4- post wt 7/28 83.9  Access:LU AVF Heparin4000 unit bolus, 2000 intermittent Mircera52mcg q2wks - last 7/26  Assessment/ Plan:   1. Abdominal pain, recurrent.  CT scan unrevealing.  Unable to use anti-emetic agents due to prolonged QT interval.  Per primary 2. ESRD- plan for HD today 3. Anemia: Hgb dropped from 12.8 to 9.8.  Will recheck to make sure it is not a spurious value.  However if her Hgb has dropped, this may explain her hypotension.  Although no hematochezia, melena, or BRBPR. 4. CKD-MBD: continue with binders 5. Nutrition: renal diet 6. Hypertension: dropped overnight.  Responding to IVF.  Recheck CBC as above, hold coreg. 7. Disposition- pt currently under observation.  Donetta Potts, MD [place] Pager [phone] 10/15/2019, 9:09 AM

## 2019-10-15 NOTE — Progress Notes (Signed)
   10/15/19 1107  Vitals  Temp 97.8 F (36.6 C)  Temp Source Axillary  BP (!) 76/46  MAP (mmHg) (!) 56  BP Location Left Wrist  BP Method Automatic  Patient Position (if appropriate) Lying  Pulse Rate 82  Pulse Rate Source Monitor  ECG Heart Rate 82  Resp 13  Level of Consciousness  Level of Consciousness Alert  Oxygen Therapy  SpO2 100 %  O2 Device Nasal Cannula  Pain Assessment  Pain Scale 0-10  Pain Score 0  POSS Scale (Pasero Opioid Sedation Scale)  POSS *See Group Information* 1-Acceptable,Awake and alert  PCA/Epidural/Spinal Assessment  Respiratory Pattern Regular;Unlabored  Glasgow Coma Scale  Eye Opening 4  Best Verbal Response (NON-intubated) 5  Best Motor Response 6  Glasgow Coma Scale Score 15  paged Dr. Pietro Cassis regarding low bp, new orders rec'd for bolus. Will continue to monitor.

## 2019-10-15 NOTE — Progress Notes (Signed)
Notified MD with update of elevated BP 178/99 ask if we needed to restart Coreg. MD will make dose adjustments and order Coreg. I will continue to monitor.

## 2019-10-16 DIAGNOSIS — I959 Hypotension, unspecified: Secondary | ICD-10-CM

## 2019-10-16 LAB — BASIC METABOLIC PANEL
Anion gap: 9 (ref 5–15)
BUN: 18 mg/dL (ref 6–20)
CO2: 28 mmol/L (ref 22–32)
Calcium: 9 mg/dL (ref 8.9–10.3)
Chloride: 98 mmol/L (ref 98–111)
Creatinine, Ser: 5.01 mg/dL — ABNORMAL HIGH (ref 0.44–1.00)
GFR calc Af Amer: 12 mL/min — ABNORMAL LOW (ref >60)
GFR calc non Af Amer: 10 mL/min — ABNORMAL LOW (ref >60)
Glucose, Bld: 178 mg/dL — ABNORMAL HIGH (ref 70–99)
Potassium: 3.7 mmol/L (ref 3.5–5.1)
Sodium: 135 mmol/L (ref 135–145)

## 2019-10-16 LAB — CBC WITH DIFFERENTIAL/PLATELET
Abs Immature Granulocytes: 0.01 10*3/uL (ref 0.00–0.07)
Basophils Absolute: 0 10*3/uL (ref 0.0–0.1)
Basophils Relative: 0 %
Eosinophils Absolute: 0.2 10*3/uL (ref 0.0–0.5)
Eosinophils Relative: 3 %
HCT: 29.3 % — ABNORMAL LOW (ref 36.0–46.0)
Hemoglobin: 9.6 g/dL — ABNORMAL LOW (ref 12.0–15.0)
Immature Granulocytes: 0 %
Lymphocytes Relative: 31 %
Lymphs Abs: 1.7 10*3/uL (ref 0.7–4.0)
MCH: 31.6 pg (ref 26.0–34.0)
MCHC: 32.8 g/dL (ref 30.0–36.0)
MCV: 96.4 fL (ref 80.0–100.0)
Monocytes Absolute: 0.4 10*3/uL (ref 0.1–1.0)
Monocytes Relative: 8 %
Neutro Abs: 3.1 10*3/uL (ref 1.7–7.7)
Neutrophils Relative %: 58 %
Platelets: 167 10*3/uL (ref 150–400)
RBC: 3.04 MIL/uL — ABNORMAL LOW (ref 3.87–5.11)
RDW: 14.8 % (ref 11.5–15.5)
WBC: 5.5 10*3/uL (ref 4.0–10.5)
nRBC: 0 % (ref 0.0–0.2)

## 2019-10-16 LAB — GLUCOSE, CAPILLARY
Glucose-Capillary: 144 mg/dL — ABNORMAL HIGH (ref 70–99)
Glucose-Capillary: 144 mg/dL — ABNORMAL HIGH (ref 70–99)

## 2019-10-16 MED ORDER — CARVEDILOL 6.25 MG PO TABS
6.2500 mg | ORAL_TABLET | Freq: Two times a day (BID) | ORAL | Status: DC
  Administered 2019-10-16: 6.25 mg via ORAL
  Filled 2019-10-16: qty 1

## 2019-10-16 MED ORDER — ONDANSETRON 4 MG PO TBDP: 4.0000 mg | ORAL_TABLET | Freq: Three times a day (TID) | ORAL | 0 refills | Status: AC | PRN

## 2019-10-16 NOTE — Discharge Summary (Signed)
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 female with PMH of ESRD on HD MWF,history of renal transplant, chronic anemia, type 1 diabetes mellitus, hypertension, hyperlipidemia, GERD, gastroparesis, XXXsyndrome, frequent ER visits due to intractable nausea and vomiting mostly secondary to gastroparesis.   Patient presented to ED on 8/1 with complaint of nausea, vomiting, abdominal pain and chest pain.  Patient was hospitalized 7/29-7/30 for similar complaint.  Next day, she came to ED again but left after tryouts.  8/1, she returned again with intractable nausea and vomiting.  She also complained of lower chest pain during vomiting.  She reported brown/dark-colored vomiting but it is not sure if it is blood.   In the ED, hemodynamically stable Blood work showed elevated BUN, creatinine, troponin elevated 32>40,EKG T wave similar QT prolonged 529, Peaking of [place]- present on previous EKGs. CT scan of the abdomen pelvis did not show any acute finding.  Regular intractable symptoms, prolonged QTC, patient was kept under observation.  Hospital Course:  Intractable nausea, vomiting, abdominal pain, chest pain: Diabetic gastroparesis -Suspect symptoms related due to for  hergastroparesis.  -Has had recurrent admission ED visits for similar presentation. -due to prolonged QTC, many antiemetics are limited.  She states that Zofran works better for her. -She was kept on supportive measures with Protonix and IV antiemetics. -She states oral Zofran works for her.  We will send a prescription for that to her pharmacy. -Continue Protonix. -Encouraged to follow-up with dietitian at dialysis center.  ESRD on HD -MWF dialysis.  Last dialysis yesterday. -Nephrology consultation appreciated.  Poorly controlled type 1 diabetes mellitus, A1c 9.2 on April 2021. -At home on Tresiba 20 units daily,and sliding scale.  -Continue the same at discharge.  Continue to follow-up with PCP/endocrinology as an outpatient.  Hypotension -Patient has history of hypertension and is on Coreg at home. -Initially she was hypotensive.  In last 24 hours, blood pressure has improved.  Coreg has been resumed. -Continue same dose of Coreg at home.    Chronic anemia of ESRD -Reported 1 episode of dark-colored vomitus on admission. -Hemoglobin dropped from 12.8 on 8/1 to 9.8 on 8/2.  -No recurrence of hematemesis or hematochezia reported since admission.  Unclear if it was true bleeding. -Despite ESRD, baseline hemoglobin seems to be more than 11 -Hemoglobin stable in last 24 hours. -Continue to monitor as an outpatient.  Elevated troponin  -Only mildly elevated at 32  > 40 -EKG without significant changes.   -Patient had Lexiscan in December 2020 which was normal.    Chronic pain with chronic narcotic use   Chronic anemia of ESRD:Hemoglobin is stable.  Hyperlipidemia:Continue on statin.  XXXsyndrome  Mobility: Encourage ambulation.  Does not use assistive devices at home. Code Status:  Code Status: Full Code  Wound care:    Subjective:  Seen and examined this morning.  Pleasant, [place] African-American female. Not in distress.  Nausea, vomiting  controlled.  Blood pressure stabilized. Feels ready to go home today.  Discharge Exam:   Vitals:   10/15/19 1954 10/15/19 2259 10/16/19 0352 10/16/19 0729  BP: (!) 178/99 138/80 116/83 (!) 169/106  Pulse: (!) 104 (!) 108 93 (!) 32  Resp: 13 17 18  (!) 21  Temp: 98.6 F (37 C) 98.6 F (37 C) 98.1 F (36.7 C) 98.5 F (36.9 C)  TempSrc: Oral Oral Oral Oral  SpO2: 95% 97% 99% 97%  Weight:      Height:        Body mass index is 28.36 kg/m.  General exam: Appears calm and comfortable.  Not in physical distress Skin: No rashes, lesions or ulcers. HEENT: Atraumatic, normocephalic, supple neck, no obvious bleeding Lungs: Clear to auscultation bilaterally CVS: Regular rate and rhythm, no murmur GI/Abd soft, nontender, nondistended, bowel sound present CNS: Alert, awake, oriented x3 Psychiatry: Mood appropriate Extremities: No pedal edema, no calf tenderness  Follow ups:   Discharge Instructions    Diet - low sodium heart healthy   Complete by: As directed    Diet Carb Modified   Complete by: As directed    Increase activity slowly   Complete by: As directed       Follow-up Information    [name], MD Follow up.   Specialty: Internal Medicine Contact information: [address] [place] [address] [phone]        Donato Heinz, MD Follow up.   Specialty: Nephrology Contact information: [place] [address] [phone]               Recommendations for Outpatient Follow-Up:   1. Follow-up with PCP as an outpatient 2. Follow-up with nephrology as an outpatient  Discharge Instructions:  Follow with Primary MD [name], MD in 7 days   Get CBC/BMP checked in next visit within 1 week by PCP or SNF MD ( we routinely change or add medications that can affect your baseline labs and fluid status, therefore we recommend that you get the mentioned basic workup next visit with your PCP, your PCP may decide not to get them  or add new tests based on their clinical decision)  On your next visit with your PCP, please Get Medicines reviewed and adjusted.  Please request your PCP  to go over all Hospital Tests and Procedure/Radiological results at the follow up, please get all Hospital records sent to your Prim MD by signing hospital release before you go home.  Activity: As tolerated with Full fall precautions use walker/cane & assistance as needed  For Heart failure patients - Check your Weight same time everyday, if you gain over 2 pounds, or you develop in leg swelling, experience more shortness of breath or chest pain, call your Primary MD immediately. Follow Cardiac Low Salt Diet and 1.5 lit/day fluid restriction.  If you have smoked or chewed Tobacco in the last 2 yrs please stop smoking, stop any regular Alcohol  and or any Recreational drug use.  If you experience worsening of your admission symptoms, develop shortness of breath, life threatening emergency, suicidal or homicidal thoughts you must seek medical attention immediately by calling 911 or calling your MD immediately  if symptoms less severe.  You Must read complete instructions/literature along with all the possible adverse reactions/side effects for all the Medicines you take and that  have been prescribed to you. Take any new Medicines after you have completely understood and accpet all the possible adverse reactions/side effects.   Do not drive, operate heavy machinery, perform activities at heights, swimming or participation in water activities or provide baby sitting services if your were admitted for syncope or siezures until you have seen by Primary MD or a Neurologist and advised to do so again.  Do not drive when taking Pain medications.  Do not take more than prescribed Pain, Sleep and Anxiety Medications  Wear Seat belts while driving.   Please note You were cared for by a hospitalist during your hospital stay. If you have any questions  about your discharge medications or the care you received while you were in the hospital after you are discharged, you can call the unit and asked to speak with the hospitalist on call if the hospitalist that took care of you is not available. Once you are discharged, your primary care physician will handle any further medical issues. Please note that NO REFILLS for any discharge medications will be authorized once you are discharged, as it is imperative that you return to your primary care physician (or establish a relationship with a primary care physician if you do not have one) for your aftercare needs so that they can reassess your need for medications and monitor your lab values.    Allergies as of 10/16/2019      Reactions   Benadryl [diphenhydramine-zinc Acetate] Anaphylaxis   Stopped breathing in ICU   Doxycycline Shortness Of Breath   Motrin [ibuprofen] Shortness Of Breath, Itching   Per pt   Banana Other (See Comments)   Sick on the stomach   Iron Dextran Other (See Comments)   REACTION: vein irritation   Shellfish Allergy Hives   Chlorhexidine Itching      Medication List    STOP taking these medications   predniSONE 20 MG tablet Commonly known as: DELTASONE     TAKE these medications   Accu-Chek Softclix Lancets lancets Use to check blood sugar 4 times per day.   acetaminophen 500 MG tablet Commonly known as: TYLENOL Take 1,000 mg by mouth every 6 (six) hours as needed for headache (pain).   b complex-vitamin c-folic acid 0.8 MG Tabs tablet Take 1 tablet by mouth every Monday, Wednesday, and Friday.   calcium acetate 667 MG capsule Commonly known as: PHOSLO Take 667-1,334 mg by mouth See admin instructions. Take 2 capsules (1334 mg) by mouth up to 4 times daily with meals, take 1 capsule (667 mg) with snacks   calcium carbonate (dosed in mg elemental calcium) 1250 MG/5ML Susp Take 5 mLs by mouth 3 (three) times daily.   carvedilol 12.5 MG tablet Commonly known  as: COREG Take 1 tablet (12.5 mg total) by mouth 2 (two) times daily with a meal. What changed:   when to take this  reasons to take this   cetirizine 10 MG tablet Commonly known as: ZYRTEC Take 10 mg by mouth daily as needed for allergies.   D 1000 25 MCG (1000 UT) capsule Generic drug: Cholecalciferol Take 1,000 Units by mouth every other day.   dicyclomine 20 MG tablet Commonly known as: BENTYL Take 1 tablet (20 mg total) by mouth 2 (two) times daily as needed (stomach issues.).   fluticasone 50 MCG/ACT nasal spray Commonly known as: FLONASE Place 2 sprays into both nostrils daily as needed for allergies.   gabapentin 400 MG capsule Commonly known as: NEURONTIN Take  400 mg by mouth 3 (three) times daily.   hydrOXYzine 50 MG tablet Commonly known as: ATARAX/VISTARIL Take 1 tablet (50 mg total) by mouth 2 (two) times daily as needed for itching.   insulin degludec 100 UNIT/ML FlexTouch Pen Commonly known as: TRESIBA Inject 0.6 mLs (60 Units total) into the skin 2 (two) times daily. Reports taking 24 units QAM What changed:   how much to take  when to take this  additional instructions   lidocaine-prilocaine cream Commonly known as: EMLA Apply 1 application topically See admin instructions. Apply topically one hour prior to dialysis on Monday, Wednesday, Friday   loperamide 1 MG/5ML solution Commonly known as: IMODIUM Take 2 mg by mouth 2 (two) times daily.   metoCLOPramide 10 MG tablet Commonly known as: REGLAN Take 1 tablet (10 mg total) by mouth 3 (three) times daily before meals.   MIRCERA IJ Mircera   NovoLOG FlexPen 100 UNIT/ML FlexPen Generic drug: insulin aspart Inject 2-10 Units into the skin 3 (three) times daily with meals. as needed for blood sugar management (sliding scale)   ondansetron 4 MG disintegrating tablet Commonly known as: Zofran ODT Take 1 tablet (4 mg total) by mouth every 8 (eight) hours as needed for up to 5 days for nausea or  vomiting.   oxyCODONE-acetaminophen 5-325 MG tablet Commonly known as: PERCOCET/ROXICET   pantoprazole 40 MG tablet Commonly known as: PROTONIX Take 40 mg by mouth 2 (two) times daily.   promethazine 12.5 MG suppository Commonly known as: PHENERGAN Place 12.5 mg rectally 5 (five) times daily as needed for nausea or vomiting.   promethazine 25 MG tablet Commonly known as: PHENERGAN Take 25 mg by mouth every 6 (six) hours as needed for nausea or vomiting.   simvastatin 20 MG tablet Commonly known as: ZOCOR Take 20 mg by mouth at bedtime.       Time coordinating discharge: 35 minutes  The results of significant diagnostics from this hospitalization (including imaging, microbiology, ancillary and laboratory) are listed below for reference.    Procedures and Diagnostic Studies:   CT ABDOMEN PELVIS WO CONTRAST  Result Date: 10/14/2019 CLINICAL DATA:  Severe mid abd pain x several days.  N/V/D. EXAM: CT ABDOMEN AND PELVIS WITHOUT CONTRAST TECHNIQUE: Multidetector CT imaging of the abdomen and pelvis was performed following the standard protocol without IV contrast. COMPARISON:  10/11/2019 FINDINGS: Lower chest: Clear lung bases. Hepatobiliary: Liver normal in size. There several vague low-attenuation areas in the liver, not well-defined, which could reflect masses or lesions. Gallbladder surgically absent. No bile duct dilation. Pancreas: Unremarkable. No pancreatic ductal dilatation or surrounding inflammatory changes. Spleen: Normal in size without focal abnormality. Adrenals/Urinary Tract: No adrenal masses. No right kidney. Atrophic left kidney. No renal mass. No hydronephrosis. Normal left ureter. Bladder decompressed, otherwise unremarkable. Stomach/Bowel: Normal stomach. Small bowel and colon are normal in caliber. No wall thickening. No inflammation. No evidence of appendicitis. Vascular/Lymphatic: No enlarged lymph nodes. No vascular abnormality. Reproductive: Status post  hysterectomy. No adnexal masses. Other: Scarring along the right inguinal region consistent with previous inguinal herniorrhaphy, stable from the prior study. No current hernia. No ascites. Musculoskeletal: No acute or significant osseous findings. IMPRESSION: 1. No acute findings within the abdomen or pelvis. 2. Vague areas of hypoattenuation noted in the liver which could reflect masses/lesion. These would be best evaluated liver MRI without and with contrast, or alternatively with contrast enhanced CT. 3. Status post cholecystectomy and hysterectomy well as changes consistent with a prior right inguinal herniorrhaphy.  Electronically Signed   By: Lajean Manes M.D.   On: 10/14/2019 08:44   DG Chest 2 View  Result Date: 10/14/2019 CLINICAL DATA:  Chest pain and vomiting for several days EXAM: CHEST - 2 VIEW COMPARISON:  06/19/2018 FINDINGS: Cardiac shadow is within normal limits. The lungs are clear bilaterally. No sizable effusion is seen. No bony abnormality is noted. IMPRESSION: No acute abnormality noted. Electronically Signed   By: Inez Catalina M.D.   On: 10/14/2019 03:39     Labs:   Basic Metabolic Panel: Recent Labs  Lab 10/11/19 0910 10/11/19 0910 10/13/19 1711 10/13/19 1711 10/14/19 0454 10/14/19 0454 10/15/19 0201 10/16/19 0221  NA 138  --  134*  --  134*  --  131* 135  K 3.8   < > 4.3   < > 3.9   < > 3.7 3.7  CL 95*  --  92*  --  87*  --  92* 98  CO2 27  --  21*  --  24  --  23 28  GLUCOSE 220*  --  242*  --  296*  --  302* 178*  BUN 24*  --  39*  --  56*  --  68* 18  CREATININE 4.73*  --  7.38*  --  8.71*  --  10.40* 5.01*  CALCIUM 10.9*  --  8.3*  --  7.9*  --  6.1* 9.0  MG 2.2  --   --   --   --   --   --   --    < > = values in this interval not displayed.   GFR Estimated Creatinine Clearance: 16.5 mL/min (A) (by C-G formula based on SCr of 5.01 mg/dL (H)). Liver Function Tests: Recent Labs  Lab 10/10/19 1215 10/11/19 0148 10/13/19 1711 10/14/19 0454  AST 21 25  20 16   ALT 18 21 18 17   ALKPHOS 72 71 65 66  BILITOT 0.9 0.8 1.3* 1.5*  PROT 8.9* 9.9* 9.7* 9.8*  ALBUMIN 4.2 4.7 4.7 4.9   Recent Labs  Lab 10/10/19 1215 10/11/19 0148 10/13/19 1711 10/14/19 0454  LIPASE 73* 64* 46 52*   No results for input(s): AMMONIA in the last 168 hours. Coagulation profile No results for input(s): INR, PROTIME in the last 168 hours.  CBC: Recent Labs  Lab 10/13/19 1711 10/14/19 0454 10/15/19 0201 10/15/19 1028 10/16/19 0221  WBC 11.7* 14.6* 9.0 7.5 5.5  NEUTROABS  --   --   --  4.5 3.1  HGB 12.8 12.8 9.8* 9.5* 9.6*  HCT 40.2 38.4 30.0* 29.3* 29.3*  MCV 98.5 96.2 98.4 98.0 96.4  PLT 249 246 174 167 167   Cardiac Enzymes: No results for input(s): CKTOTAL, CKMB, CKMBINDEX, TROPONINI in the last 168 hours. BNP: Invalid input(s): POCBNP CBG: Recent Labs  Lab 10/14/19 2113 10/15/19 0641 10/15/19 1109 10/15/19 2054 10/16/19 0558  GLUCAP 279* 200* 232* 155* 144*   D-Dimer No results for input(s): DDIMER in the last 72 hours. Hgb A1c No results for input(s): HGBA1C in the last 72 hours. Lipid Profile No results for input(s): CHOL, HDL, LDLCALC, TRIG, CHOLHDL, LDLDIRECT in the last 72 hours. Thyroid function studies No results for input(s): TSH, T4TOTAL, T3FREE, THYROIDAB in the last 72 hours.  Invalid input(s): FREET3 Anemia work up No results for input(s): VITAMINB12, FOLATE, FERRITIN, TIBC, IRON, RETICCTPCT in the last 72 hours. Microbiology Recent Results (from the past 240 hour(s))  SARS Coronavirus 2 by RT PCR (hospital order, performed  in [facility] lab) Nasopharyngeal Nasopharyngeal Swab     Status: None   Collection Time: 10/11/19  6:19 AM   Specimen: Nasopharyngeal Swab  Result Value Ref Range Status   SARS Coronavirus 2 NEGATIVE NEGATIVE Final    Comment: (NOTE) SARS-CoV-2 target nucleic acids are NOT DETECTED.  The SARS-CoV-2 RNA is generally detectable in upper and lower respiratory specimens during the acute  phase of infection. The lowest concentration of SARS-CoV-2 viral copies this assay can detect is 250 copies / mL. A negative result does not preclude SARS-CoV-2 infection and should not be used as the sole basis for treatment or other patient management decisions.  A negative result may occur with improper specimen collection / handling, submission of specimen other than nasopharyngeal swab, presence of viral mutation(s) within the areas targeted by this assay, and inadequate number of viral copies (<250 copies / mL). A negative result must be combined with clinical observations, patient history, and epidemiological information.  Fact Sheet for Patients:   StrictlyIdeas.no  Fact Sheet for Healthcare Providers: BankingDealers.co.za  This test is not yet approved or  cleared by the [place] FDA and has been authorized for detection and/or diagnosis of SARS-CoV-2 by FDA under an Emergency Use Authorization (EUA).  This EUA will remain in effect (meaning this test can be used) for the duration of the COVID-19 declaration under Section 564(b)(1) of the Act, 21 U.S.C. section 360bbb-3(b)(1), unless the authorization is terminated or revoked sooner.  Performed at [facility], [place] [address] [place], [place] [address]   SARS Coronavirus 2 by RT PCR (hospital order, performed in [facility] hospital lab) Nasopharyngeal Nasopharyngeal Swab     Status: None   Collection Time: 10/14/19 11:55 AM   Specimen: Nasopharyngeal Swab  Result Value Ref Range Status   SARS Coronavirus 2 NEGATIVE NEGATIVE Final    Comment: (NOTE) SARS-CoV-2 target nucleic acids are NOT DETECTED.  The SARS-CoV-2 RNA is generally detectable in upper and lower respiratory specimens during the acute phase of infection. The lowest concentration of SARS-CoV-2 viral copies this assay can detect is 250 copies / mL. A negative result does not preclude  SARS-CoV-2 infection and should not be used as the sole basis for treatment or other patient management decisions.  A negative result may occur with improper specimen collection / handling, submission of specimen other than nasopharyngeal swab, presence of viral mutation(s) within the areas targeted by this assay, and inadequate number of viral copies (<250 copies / mL). A negative result must be combined with clinical observations, patient history, and epidemiological information.  Fact Sheet for Patients:   StrictlyIdeas.no  Fact Sheet for Healthcare Providers: BankingDealers.co.za  This test is not yet approved or  cleared by the [place] FDA and has been authorized for detection and/or diagnosis of SARS-CoV-2 by FDA under an Emergency Use Authorization (EUA).  This EUA will remain in effect (meaning this test can be used) for the duration of the COVID-19 declaration under Section 564(b)(1) of the Act, 21 U.S.C. section 360bbb-3(b)(1), unless the authorization is terminated or revoked sooner.  Performed at [facility], [place] [address], [place], [place] [address]   MRSA PCR Screening     Status: None   Collection Time: 10/14/19  5:00 PM   Specimen: Nasal Mucosa; Nasopharyngeal  Result Value Ref Range Status   MRSA by PCR NEGATIVE NEGATIVE Final    Comment:        The GeneXpert MRSA Assay (FDA approved  for NASAL specimens only), is one component of a comprehensive MRSA colonization surveillance program. It is not intended to diagnose MRSA infection nor to guide or monitor treatment for MRSA infections. Performed at [facility], [place] [address], [place], [place] [address]      Signed: Terrilee Croak  [facility] 10/16/2019, 10:33 AM

## 2019-10-16 NOTE — Discharge Instructions (Signed)
Gastroparesis  Gastroparesis is a condition in which food takes longer than normal to empty from the stomach. The condition is usually long-lasting (chronic). It may also be called delayed gastric emptying. There is no cure, but there are treatments and things that you can do at home to help relieve symptoms. Treating the underlying condition that causes gastroparesis can also help relieve symptoms. What are the causes? In many cases, the cause of this condition is not known. Possible causes include:  A hormone (endocrine) disorder, such as hypothyroidism or diabetes.  A nervous system disease, such as Parkinson's disease or multiple sclerosis.  Cancer, infection, or surgery that affects the stomach or vagus nerve. The vagus nerve runs from your chest, through your neck, to the lower part of your brain.  A connective tissue disorder, such as scleroderma.  Certain medicines. What increases the risk? You are more likely to develop this condition if you:  Have certain disorders or diseases, including: ? An endocrine disorder. ? An eating disorder. ? Amyloidosis. ? Scleroderma. ? Parkinson's disease. ? Multiple sclerosis. ? Cancer or infection of the stomach or the vagus nerve.  Have had surgery on the stomach or vagus nerve.  Take certain medicines.  Are female. What are the signs or symptoms? Symptoms of this condition include:  Feeling full after eating very little.  Nausea.  Vomiting.  Heartburn.  Abdominal bloating.  Inconsistent blood sugar (glucose) levels on blood tests.  Lack of appetite.  Weight loss.  Acid from the stomach coming up into the esophagus (gastroesophageal reflux).  Sudden tightening (spasm) of the stomach, which can be painful. Symptoms may come and go. Some people may not notice any symptoms. How is this diagnosed? This condition is diagnosed with tests, such as:  Tests that check how long it takes food to move through the stomach and  intestines. These tests include: ? Upper gastrointestinal (GI) series. For this test, you drink a liquid that shows up well on X-rays, and then X-rays will be taken of your intestines. ? Gastric emptying scintigraphy. For this test, you eat food that contains a small amount of radioactive material, and then scans are taken. ? Wireless capsule GI monitoring system. For this test, you swallow a pill (capsule) that records information about how foods and fluid move through your stomach.  Gastric manometry. For this test, a tube is passed down your throat and into your stomach to measure electrical and muscular activity.  Endoscopy. For this test, a long, thin tube is passed down your throat and into your stomach to check for problems in your stomach lining.  Ultrasound. This test uses sound waves to create images of inside the body. This can help rule out gallbladder disease or pancreatitis as a cause of your symptoms. How is this treated? There is no cure for gastroparesis. Treatment may include:  Treating the underlying cause.  Managing your symptoms by making changes to your diet and exercise habits.  Taking medicines to control nausea and vomiting and to stimulate stomach muscles.  Getting food through a feeding tube in the hospital. This may be done in severe cases.  Having surgery to insert a device into your body that helps improve stomach emptying and control nausea and vomiting (gastric neurostimulator). Follow these instructions at home:  Take over-the-counter and prescription medicines only as told by your health care provider.  Follow instructions from your health care provider about eating or drinking restrictions. Your health care provider may recommend that you: ? Eat   smaller meals more often. ? Eat low-fat foods. ? Eat low-fiber forms of high-fiber foods. For example, eat cooked vegetables instead of raw vegetables. ? Have only liquid foods instead of solid foods. Liquid  foods are easier to digest.  Drink enough fluid to keep your urine pale yellow.  Exercise as often as told by your health care provider.  Keep all follow-up visits as told by your health care provider. This is important. Contact a health care provider if you:  Notice that your symptoms do not improve with treatment.  Have new symptoms. Get help right away if you:  Have severe abdominal pain that does not improve with treatment.  Have nausea that is severe or does not go away.  Cannot drink fluids without vomiting. Summary  Gastroparesis is a chronic condition in which food takes longer than normal to empty from the stomach.  Symptoms include nausea, vomiting, heartburn, abdominal bloating, and loss of appetite.  Eating smaller portions, and low-fat, low-fiber foods may help you manage your symptoms.  Get help right away if you have severe abdominal pain. This information is not intended to replace advice given to you by your health care provider. Make sure you discuss any questions you have with your health care provider. Document Revised: 05/30/2017 Document Reviewed: 01/04/2017 Elsevier Patient Education  2020 Elsevier Inc.  

## 2019-10-16 NOTE — Progress Notes (Signed)
Discussed and explained discharge instructions, prescriptions sent and follow up appts given. Pt going home with belongings.

## 2019-10-16 NOTE — Plan of Care (Signed)

## 2019-10-16 NOTE — Progress Notes (Signed)
Patient ID: [name], female   DOB: 09-08-1985, 34 y.o.   MRN: [id number] S:Feels better this morning.  No abdominal pain and BP improved  O:BP (!) 169/106 (BP Location: Right Leg)   Pulse (!) 32   Temp 98.5 F (36.9 C) (Oral)   Resp (!) 21   Ht 5\' 6"  (1.676 m)   Wt 79.7 kg   SpO2 97%   BMI 28.36 kg/m   Intake/Output Summary (Last 24 hours) at 10/16/2019 0911 Last data filed at 10/15/2019 1800 Gross per 24 hour  Intake 360 ml  Output 234 ml  Net 126 ml   Intake/Output: I/O last 3 completed shifts: In: 1847.7 [P.O.:840; IV Piggyback:1007.7] Out: 234 [Other:234]  Intake/Output this shift:  No intake/output data recorded. Weight change: 2.9 kg Gen: NAD CVS: RRR no rub, HR is 70 not 32 Resp:cta Abd: +BS, soft, NT/ND Ext: no edema LUE AVF +T/B  Recent Labs  Lab 10/10/19 1215 10/10/19 1215 10/11/19 0148 10/11/19 0450 10/11/19 0910 10/13/19 1711 10/14/19 0454 10/15/19 0201 10/16/19 0221  NA 136  --  137  --  138 134* 134* 131* 135  K 4.3   < > 6.8* 7.0* 3.8 4.3 3.9 3.7 3.7  CL 96*  --  89*  --  95* 92* 87* 92* 98  CO2 24  --  26  --  27 21* 24 23 28   GLUCOSE 271*  --  386*  --  220* 242* 296* 302* 178*  BUN 22*  --  41*  --  24* 39* 56* 68* 18  CREATININE 5.26*  --  7.49*  --  4.73* 7.38* 8.71* 10.40* 5.01*  ALBUMIN 4.2  --  4.7  --   --  4.7 4.9  --   --   CALCIUM 10.6*  --  9.9  --  10.9* 8.3* 7.9* 6.1* 9.0  AST 21  --  25  --   --  20 16  --   --   ALT 18  --  21  --   --  18 17  --   --    < > = values in this interval not displayed.   Liver Function Tests: Recent Labs  Lab 10/11/19 0148 10/13/19 1711 10/14/19 0454  AST 25 20 16   ALT 21 18 17   ALKPHOS 71 65 66  BILITOT 0.8 1.3* 1.5*  PROT 9.9* 9.7* 9.8*  ALBUMIN 4.7 4.7 4.9   Recent Labs  Lab 10/11/19 0148 10/13/19 1711 10/14/19 0454  LIPASE 64* 46 52*   No results for input(s): AMMONIA in the last 168 hours. CBC: Recent Labs  Lab 10/13/19 1711 10/13/19 1711 10/14/19 0454 10/14/19 0454  10/15/19 0201 10/15/19 1028 10/16/19 0221  WBC 11.7*   < > 14.6*   < > 9.0 7.5 5.5  NEUTROABS  --   --   --   --   --  4.5 3.1  HGB 12.8   < > 12.8   < > 9.8* 9.5* 9.6*  HCT 40.2   < > 38.4   < > 30.0* 29.3* 29.3*  MCV 98.5  --  96.2  --  98.4 98.0 96.4  PLT 249   < > 246   < > 174 167 167   < > = values in this interval not displayed.   Cardiac Enzymes: No results for input(s): CKTOTAL, CKMB, CKMBINDEX, TROPONINI in the last 168 hours. CBG: Recent Labs  Lab 10/14/19 2113 10/15/19 0932 10/15/19 1109 10/15/19 2054  10/16/19 0558  GLUCAP 279* 200* 232* 155* 144*    Iron Studies: No results for input(s): IRON, TIBC, TRANSFERRIN, FERRITIN in the last 72 hours. Studies/Results: No results found. . calcium carbonate (dosed in mg elemental calcium)  500 mg of elemental calcium Oral TID  . carvedilol  6.25 mg Oral BID WC  . feeding supplement  1 Container Oral TID BM  . gabapentin  400 mg Oral TID  . insulin aspart  0-6 Units Subcutaneous TID WC  . insulin glargine  10 Units Subcutaneous Daily  . pantoprazole  40 mg Oral Daily  . pneumococcal 23 valent vaccine  0.5 mL Intramuscular Tomorrow-1000  . simvastatin  20 mg Oral QHS    BMET    Component Value Date/Time   NA 135 10/16/2019 0221   K 3.7 10/16/2019 0221   CL 98 10/16/2019 0221   CO2 28 10/16/2019 0221   GLUCOSE 178 (H) 10/16/2019 0221   BUN 18 10/16/2019 0221   CREATININE 5.01 (H) 10/16/2019 0221   CALCIUM 9.0 10/16/2019 0221   CALCIUM 5.7 (LL) 03/20/2018 1915   GFRNONAA 10 (L) 10/16/2019 0221   GFRAA 12 (L) 10/16/2019 0221   CBC    Component Value Date/Time   WBC 5.5 10/16/2019 0221   RBC 3.04 (L) 10/16/2019 0221   HGB 9.6 (L) 10/16/2019 0221   HCT 29.3 (L) 10/16/2019 0221   PLT 167 10/16/2019 0221   PLT 206 07/17/2009 0000   MCV 96.4 10/16/2019 0221   MCH 31.6 10/16/2019 0221   MCHC 32.8 10/16/2019 0221   RDW 14.8 10/16/2019 0221   LYMPHSABS 1.7 10/16/2019 0221   MONOABS 0.4 10/16/2019 0221    EOSABS 0.2 10/16/2019 0221   BASOSABS 0.0 10/16/2019 0221   Dialysis Orders: MWF [facility] 4h[facility], BFR350, SKA768, EDW 83kg,2K/3.5CaP4- post wt 7/28 83.9 Access:LU AVF Heparin4000 unit bolus, 2000 intermittent Mircera26mcg q2wks - last 7/26  Assessment/Plan:  1. Abdominal pain, recurrent.  CT scan unrevealing.  Unable to use anti-emetic agents due to prolonged QT interval.  Pain resolved since yesterday.  ESRD- plan for HD today 2. Anemia: Hgb dropped from 12.8 to 9.8.  Will recheck to make sure it is not a spurious value.  However if her Hgb has dropped, this may explain her hypotension.  Although no hematochezia, melena, or BRBPR. 3. CKD-MBD: continue with binders 4. Nutrition: renal diet 5. Hypertension: dropped yesterday morning but jumped up last night.  Back on coreg with improved BP's. 6. Disposition- pt currently under observation.  Stable for discharge from renal standpoint.  Donetta Potts, MD [employer] [phone]

## 2019-10-17 ENCOUNTER — Telehealth: Payer: Self-pay | Admitting: Nephrology

## 2019-10-17 LAB — GLUCOSE, CAPILLARY: Glucose-Capillary: 121 mg/dL — ABNORMAL HIGH (ref 70–99)

## 2019-10-17 NOTE — Telephone Encounter (Signed)
Transition of care contact from inpatient facility  Date of Discharge: 10/16/19 Date of Contact: 10/17/19 Method of contact: phone Talked with:patient  Patient contact to discuss transition of care from recent inpatient hospitalization. Pateint was admitted to [facility] from 8/1 - 10/16/19 :  with the diagnosis of intractable N and V/gstroparesis   Medication changes were reviewed. No med change Patient will follow up at outpatient dialysis on MWF schedule - went earlier today - no problems.  Other follow up needs:usual appts  Amalia Hailey, PA-C [facility] Pager:  [phone]

## 2019-11-07 DIAGNOSIS — T7840XA Allergy, unspecified, initial encounter: Secondary | ICD-10-CM | POA: Insufficient documentation

## 2019-11-07 DIAGNOSIS — T782XXA Anaphylactic shock, unspecified, initial encounter: Secondary | ICD-10-CM | POA: Insufficient documentation

## 2019-12-11 ENCOUNTER — Other Ambulatory Visit: Payer: Self-pay

## 2019-12-11 ENCOUNTER — Ambulatory Visit: Payer: Medicare Other | Admitting: Obstetrics and Gynecology

## 2019-12-11 ENCOUNTER — Other Ambulatory Visit (HOSPITAL_COMMUNITY)
Admission: RE | Admit: 2019-12-11 | Discharge: 2019-12-11 | Disposition: A | Discharge status: Home / Self Care | Payer: Medicare Other | Source: Ambulatory Visit | Attending: Obstetrics and Gynecology | Admitting: Obstetrics and Gynecology

## 2019-12-11 ENCOUNTER — Ambulatory Visit (INDEPENDENT_AMBULATORY_CARE_PROVIDER_SITE_OTHER): Payer: Medicare Other | Admitting: Obstetrics and Gynecology

## 2019-12-11 ENCOUNTER — Encounter: Payer: Self-pay | Admitting: Obstetrics and Gynecology

## 2019-12-11 VITALS — BP 142/94 | HR 104 | Ht 66.0 in | Wt 188.3 lb

## 2019-12-11 DIAGNOSIS — Z01419 Encounter for gynecological examination (general) (routine) without abnormal findings: Secondary | ICD-10-CM | POA: Insufficient documentation

## 2019-12-11 DIAGNOSIS — Z1151 Encounter for screening for human papillomavirus (HPV): Secondary | ICD-10-CM | POA: Diagnosis not present

## 2019-12-11 DIAGNOSIS — R1031 Right lower quadrant pain: Secondary | ICD-10-CM

## 2019-12-11 NOTE — Progress Notes (Signed)
New Patient is in the office for annual. Referral from [facility]. Pt reports that age 34 kidney and other "things" were removed and she has never had a a menstrual cycle.
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
## 2019-12-12 LAB — CERVICOVAGINAL ANCILLARY ONLY
Bacterial Vaginitis (gardnerella): NEGATIVE
Candida Glabrata: NEGATIVE
Candida Vaginitis: NEGATIVE
Chlamydia: NEGATIVE
Comment: NEGATIVE
Comment: NEGATIVE
Comment: NEGATIVE
Comment: NEGATIVE
Comment: NEGATIVE
Comment: NORMAL
Neisseria Gonorrhea: NEGATIVE
Trichomonas: NEGATIVE

## 2019-12-12 LAB — RPR: RPR Ser Ql: NONREACTIVE

## 2019-12-12 LAB — HIV ANTIBODY (ROUTINE TESTING W REFLEX): HIV Screen 4th Generation wRfx: NONREACTIVE

## 2019-12-12 LAB — HEPATITIS B SURFACE ANTIGEN: Hepatitis B Surface Ag: NEGATIVE

## 2019-12-12 LAB — HEPATITIS C ANTIBODY: Hep C Virus Ab: <0.1 s/co ratio (ref 0.0–0.9)

## 2019-12-14 LAB — CYTOLOGY - PAP
Comment: NEGATIVE
Diagnosis: NEGATIVE
High risk HPV: NEGATIVE

## 2019-12-18 ENCOUNTER — Ambulatory Visit
Admission: RE | Admit: 2019-12-18 | Discharge: 2019-12-18 | Disposition: A | Discharge status: Home / Self Care | Payer: Medicare Other | Source: Ambulatory Visit | Attending: Obstetrics and Gynecology | Admitting: Obstetrics and Gynecology

## 2019-12-18 ENCOUNTER — Other Ambulatory Visit: Payer: Self-pay

## 2019-12-18 DIAGNOSIS — R1031 Right lower quadrant pain: Secondary | ICD-10-CM | POA: Insufficient documentation

## 2020-02-12 ENCOUNTER — Inpatient Hospital Stay (HOSPITAL_COMMUNITY): Payer: Medicare Other

## 2020-02-12 ENCOUNTER — Emergency Department (HOSPITAL_COMMUNITY): Payer: Medicare Other

## 2020-02-12 ENCOUNTER — Encounter (HOSPITAL_COMMUNITY): Payer: Self-pay

## 2020-02-12 ENCOUNTER — Inpatient Hospital Stay (HOSPITAL_COMMUNITY)
Admission: EM | Admit: 2020-02-12 | Discharge: 2020-02-15 | DRG: 100 | Disposition: A | Discharge status: Home / Self Care | Payer: Medicare Other | Attending: Internal Medicine | Admitting: Internal Medicine

## 2020-02-12 DIAGNOSIS — R4182 Altered mental status, unspecified: Secondary | ICD-10-CM | POA: Diagnosis not present

## 2020-02-12 DIAGNOSIS — R109 Unspecified abdominal pain: Secondary | ICD-10-CM

## 2020-02-12 DIAGNOSIS — E1043 Type 1 diabetes mellitus with diabetic autonomic (poly)neuropathy: Secondary | ICD-10-CM | POA: Diagnosis present

## 2020-02-12 DIAGNOSIS — R569 Unspecified convulsions: Secondary | ICD-10-CM | POA: Diagnosis present

## 2020-02-12 DIAGNOSIS — E1010 Type 1 diabetes mellitus with ketoacidosis without coma: Secondary | ICD-10-CM | POA: Diagnosis present

## 2020-02-12 DIAGNOSIS — Z6830 Body mass index (BMI) 30.0-30.9, adult: Secondary | ICD-10-CM | POA: Diagnosis not present

## 2020-02-12 DIAGNOSIS — R739 Hyperglycemia, unspecified: Secondary | ICD-10-CM

## 2020-02-12 DIAGNOSIS — Z888 Allergy status to other drugs, medicaments and biological substances status: Secondary | ICD-10-CM | POA: Diagnosis not present

## 2020-02-12 DIAGNOSIS — R012 Other cardiac sounds: Secondary | ICD-10-CM | POA: Diagnosis not present

## 2020-02-12 DIAGNOSIS — E1022 Type 1 diabetes mellitus with diabetic chronic kidney disease: Secondary | ICD-10-CM | POA: Diagnosis present

## 2020-02-12 DIAGNOSIS — T8612 Kidney transplant failure: Secondary | ICD-10-CM | POA: Diagnosis present

## 2020-02-12 DIAGNOSIS — N2581 Secondary hyperparathyroidism of renal origin: Secondary | ICD-10-CM | POA: Diagnosis present

## 2020-02-12 DIAGNOSIS — Z992 Dependence on renal dialysis: Secondary | ICD-10-CM

## 2020-02-12 DIAGNOSIS — D631 Anemia in chronic kidney disease: Secondary | ICD-10-CM | POA: Diagnosis present

## 2020-02-12 DIAGNOSIS — Z20822 Contact with and (suspected) exposure to covid-19: Secondary | ICD-10-CM | POA: Diagnosis present

## 2020-02-12 DIAGNOSIS — Z79899 Other long term (current) drug therapy: Secondary | ICD-10-CM

## 2020-02-12 DIAGNOSIS — I471 Supraventricular tachycardia: Secondary | ICD-10-CM | POA: Diagnosis not present

## 2020-02-12 DIAGNOSIS — Z91013 Allergy to seafood: Secondary | ICD-10-CM | POA: Diagnosis not present

## 2020-02-12 DIAGNOSIS — N186 End stage renal disease: Secondary | ICD-10-CM | POA: Diagnosis present

## 2020-02-12 DIAGNOSIS — Z8249 Family history of ischemic heart disease and other diseases of the circulatory system: Secondary | ICD-10-CM | POA: Diagnosis not present

## 2020-02-12 DIAGNOSIS — R000 Tachycardia, unspecified: Secondary | ICD-10-CM

## 2020-02-12 DIAGNOSIS — Q970 Karyotype 47, XXX: Secondary | ICD-10-CM | POA: Diagnosis not present

## 2020-02-12 DIAGNOSIS — Y830 Surgical operation with transplant of whole organ as the cause of abnormal reaction of the patient, or of later complication, without mention of misadventure at the time of the procedure: Secondary | ICD-10-CM | POA: Diagnosis present

## 2020-02-12 DIAGNOSIS — I120 Hypertensive chronic kidney disease with stage 5 chronic kidney disease or end stage renal disease: Secondary | ICD-10-CM | POA: Diagnosis present

## 2020-02-12 DIAGNOSIS — K3184 Gastroparesis: Secondary | ICD-10-CM | POA: Diagnosis present

## 2020-02-12 DIAGNOSIS — Z8261 Family history of arthritis: Secondary | ICD-10-CM | POA: Diagnosis not present

## 2020-02-12 DIAGNOSIS — Z794 Long term (current) use of insulin: Secondary | ICD-10-CM | POA: Diagnosis not present

## 2020-02-12 DIAGNOSIS — E1143 Type 2 diabetes mellitus with diabetic autonomic (poly)neuropathy: Secondary | ICD-10-CM | POA: Diagnosis not present

## 2020-02-12 DIAGNOSIS — Z881 Allergy status to other antibiotic agents status: Secondary | ICD-10-CM

## 2020-02-12 DIAGNOSIS — E669 Obesity, unspecified: Secondary | ICD-10-CM | POA: Diagnosis present

## 2020-02-12 DIAGNOSIS — I472 Ventricular tachycardia: Secondary | ICD-10-CM

## 2020-02-12 DIAGNOSIS — I361 Nonrheumatic tricuspid (valve) insufficiency: Secondary | ICD-10-CM | POA: Diagnosis not present

## 2020-02-12 DIAGNOSIS — I10 Essential (primary) hypertension: Secondary | ICD-10-CM | POA: Diagnosis present

## 2020-02-12 LAB — BASIC METABOLIC PANEL
Anion gap: 19 — ABNORMAL HIGH (ref 5–15)
Anion gap: 20 — ABNORMAL HIGH (ref 5–15)
BUN: 59 mg/dL — ABNORMAL HIGH (ref 6–20)
BUN: 64 mg/dL — ABNORMAL HIGH (ref 6–20)
CO2: 30 mmol/L (ref 22–32)
CO2: 30 mmol/L (ref 22–32)
Calcium: 9.1 mg/dL (ref 8.9–10.3)
Calcium: 8.8 mg/dL — ABNORMAL LOW (ref 8.9–10.3)
Chloride: 92 mmol/L — ABNORMAL LOW (ref 98–111)
Chloride: 91 mmol/L — ABNORMAL LOW (ref 98–111)
Creatinine, Ser: 9.88 mg/dL — ABNORMAL HIGH (ref 0.44–1.00)
Creatinine, Ser: 10.23 mg/dL — ABNORMAL HIGH (ref 0.44–1.00)
GFR, Estimated: 5 mL/min — ABNORMAL LOW (ref >60)
GFR, Estimated: 5 mL/min — ABNORMAL LOW (ref >60)
Glucose, Bld: 189 mg/dL — ABNORMAL HIGH (ref 70–99)
Glucose, Bld: 167 mg/dL — ABNORMAL HIGH (ref 70–99)
Potassium: 4.9 mmol/L (ref 3.5–5.1)
Potassium: 4.9 mmol/L (ref 3.5–5.1)
Sodium: 141 mmol/L (ref 135–145)
Sodium: 141 mmol/L (ref 135–145)

## 2020-02-12 LAB — I-STAT CHEM 8, ED
BUN: 50 mg/dL — ABNORMAL HIGH (ref 6–20)
Calcium, Ion: 0.97 mmol/L — ABNORMAL LOW (ref 1.15–1.40)
Chloride: 91 mmol/L — ABNORMAL LOW (ref 98–111)
Creatinine, Ser: 10.4 mg/dL — ABNORMAL HIGH (ref 0.44–1.00)
Glucose, Bld: 564 mg/dL (ref 70–99)
HCT: 41 % (ref 36.0–46.0)
Hemoglobin: 13.9 g/dL (ref 12.0–15.0)
Potassium: 5 mmol/L (ref 3.5–5.1)
Sodium: 135 mmol/L (ref 135–145)
TCO2: 24 mmol/L (ref 22–32)

## 2020-02-12 LAB — CBC WITH DIFFERENTIAL/PLATELET
Abs Immature Granulocytes: 0.03 10*3/uL (ref 0.00–0.07)
Abs Immature Granulocytes: 0.02 10*3/uL (ref 0.00–0.07)
Basophils Absolute: 0 10*3/uL (ref 0.0–0.1)
Basophils Absolute: 0 10*3/uL (ref 0.0–0.1)
Basophils Relative: 0 %
Basophils Relative: 0 %
Eosinophils Absolute: 0 10*3/uL (ref 0.0–0.5)
Eosinophils Absolute: 0 10*3/uL (ref 0.0–0.5)
Eosinophils Relative: 0 %
Eosinophils Relative: 0 %
HCT: 39 % (ref 36.0–46.0)
HCT: 38.9 % (ref 36.0–46.0)
Hemoglobin: 12.2 g/dL (ref 12.0–15.0)
Hemoglobin: 12.2 g/dL (ref 12.0–15.0)
Immature Granulocytes: 0 %
Immature Granulocytes: 0 %
Lymphocytes Relative: 6 %
Lymphocytes Relative: 6 %
Lymphs Abs: 0.4 10*3/uL — ABNORMAL LOW (ref 0.7–4.0)
Lymphs Abs: 0.5 10*3/uL — ABNORMAL LOW (ref 0.7–4.0)
MCH: 30.3 pg (ref 26.0–34.0)
MCH: 30.4 pg (ref 26.0–34.0)
MCHC: 31.3 g/dL (ref 30.0–36.0)
MCHC: 31.4 g/dL (ref 30.0–36.0)
MCV: 96.8 fL (ref 80.0–100.0)
MCV: 97 fL (ref 80.0–100.0)
Monocytes Absolute: 0 10*3/uL — ABNORMAL LOW (ref 0.1–1.0)
Monocytes Absolute: 0.1 10*3/uL (ref 0.1–1.0)
Monocytes Relative: 1 %
Monocytes Relative: 1 %
Neutro Abs: 6.6 10*3/uL (ref 1.7–7.7)
Neutro Abs: 6.7 10*3/uL (ref 1.7–7.7)
Neutrophils Relative %: 93 %
Neutrophils Relative %: 93 %
Platelets: 167 10*3/uL (ref 150–400)
Platelets: 173 10*3/uL (ref 150–400)
RBC: 4.03 MIL/uL (ref 3.87–5.11)
RBC: 4.01 MIL/uL (ref 3.87–5.11)
RDW: 15.4 % (ref 11.5–15.5)
RDW: 15.3 % (ref 11.5–15.5)
WBC: 7.1 10*3/uL (ref 4.0–10.5)
WBC: 7.3 10*3/uL (ref 4.0–10.5)
nRBC: 0 % (ref 0.0–0.2)
nRBC: 0 % (ref 0.0–0.2)

## 2020-02-12 LAB — I-STAT VENOUS BLOOD GAS, ED
Acid-Base Excess: 1 mmol/L (ref 0.0–2.0)
Bicarbonate: 26.8 mmol/L (ref 20.0–28.0)
Calcium, Ion: 0.97 mmol/L — ABNORMAL LOW (ref 1.15–1.40)
HCT: 42 % (ref 36.0–46.0)
Hemoglobin: 14.3 g/dL (ref 12.0–15.0)
O2 Saturation: 89 %
Potassium: 5.1 mmol/L (ref 3.5–5.1)
Sodium: 135 mmol/L (ref 135–145)
TCO2: 28 mmol/L (ref 22–32)
pCO2, Ven: 47.9 mmHg (ref 44.0–60.0)
pH, Ven: 7.356 (ref 7.250–7.430)
pO2, Ven: 60 mmHg — ABNORMAL HIGH (ref 32.0–45.0)

## 2020-02-12 LAB — GLUCOSE, CAPILLARY
Glucose-Capillary: 147 mg/dL — ABNORMAL HIGH (ref 70–99)
Glucose-Capillary: 154 mg/dL — ABNORMAL HIGH (ref 70–99)
Glucose-Capillary: 158 mg/dL — ABNORMAL HIGH (ref 70–99)
Glucose-Capillary: 160 mg/dL — ABNORMAL HIGH (ref 70–99)
Glucose-Capillary: 167 mg/dL — ABNORMAL HIGH (ref 70–99)
Glucose-Capillary: 186 mg/dL — ABNORMAL HIGH (ref 70–99)
Glucose-Capillary: 197 mg/dL — ABNORMAL HIGH (ref 70–99)
Glucose-Capillary: 365 mg/dL — ABNORMAL HIGH (ref 70–99)

## 2020-02-12 LAB — APTT
aPTT: 29 seconds (ref 24–36)
aPTT: 28 seconds (ref 24–36)

## 2020-02-12 LAB — COMPREHENSIVE METABOLIC PANEL
ALT: 27 U/L (ref 0–44)
ALT: 22 U/L (ref 0–44)
AST: 35 U/L (ref 15–41)
AST: 27 U/L (ref 15–41)
Albumin: 4.1 g/dL (ref 3.5–5.0)
Albumin: 4 g/dL (ref 3.5–5.0)
Alkaline Phosphatase: 65 U/L (ref 38–126)
Alkaline Phosphatase: 57 U/L (ref 38–126)
Anion gap: 29 — ABNORMAL HIGH (ref 5–15)
Anion gap: 23 — ABNORMAL HIGH (ref 5–15)
BUN: 52 mg/dL — ABNORMAL HIGH (ref 6–20)
BUN: 58 mg/dL — ABNORMAL HIGH (ref 6–20)
CO2: 22 mmol/L (ref 22–32)
CO2: 27 mmol/L (ref 22–32)
Calcium: 9.3 mg/dL (ref 8.9–10.3)
Calcium: 9.2 mg/dL (ref 8.9–10.3)
Chloride: 86 mmol/L — ABNORMAL LOW (ref 98–111)
Chloride: 89 mmol/L — ABNORMAL LOW (ref 98–111)
Creatinine, Ser: 10.34 mg/dL — ABNORMAL HIGH (ref 0.44–1.00)
Creatinine, Ser: 10.06 mg/dL — ABNORMAL HIGH (ref 0.44–1.00)
GFR, Estimated: 5 mL/min — ABNORMAL LOW (ref >60)
GFR, Estimated: 5 mL/min — ABNORMAL LOW (ref >60)
Glucose, Bld: 560 mg/dL (ref 70–99)
Glucose, Bld: 445 mg/dL — ABNORMAL HIGH (ref 70–99)
Potassium: 5.1 mmol/L (ref 3.5–5.1)
Potassium: 4.8 mmol/L (ref 3.5–5.1)
Sodium: 137 mmol/L (ref 135–145)
Sodium: 139 mmol/L (ref 135–145)
Total Bilirubin: 0.9 mg/dL (ref 0.3–1.2)
Total Bilirubin: 0.6 mg/dL (ref 0.3–1.2)
Total Protein: 8.9 g/dL — ABNORMAL HIGH (ref 6.5–8.1)
Total Protein: 8.6 g/dL — ABNORMAL HIGH (ref 6.5–8.1)

## 2020-02-12 LAB — BETA-HYDROXYBUTYRIC ACID
Beta-Hydroxybutyric Acid: 0.12 mmol/L (ref 0.05–0.27)
Beta-Hydroxybutyric Acid: 0.09 mmol/L (ref 0.05–0.27)

## 2020-02-12 LAB — TROPONIN I (HIGH SENSITIVITY)
Troponin I (High Sensitivity): 23 ng/L — ABNORMAL HIGH (ref <18)
Troponin I (High Sensitivity): 28 ng/L — ABNORMAL HIGH (ref <18)
Troponin I (High Sensitivity): 40 ng/L — ABNORMAL HIGH (ref <18)
Troponin I (High Sensitivity): 55 ng/L — ABNORMAL HIGH (ref <18)

## 2020-02-12 LAB — MAGNESIUM
Magnesium: 2.4 mg/dL (ref 1.7–2.4)
Magnesium: 2.5 mg/dL — ABNORMAL HIGH (ref 1.7–2.4)

## 2020-02-12 LAB — LACTIC ACID, PLASMA
Lactic Acid, Venous: 4 mmol/L (ref 0.5–1.9)
Lactic Acid, Venous: 3 mmol/L (ref 0.5–1.9)

## 2020-02-12 LAB — MRSA PCR SCREENING: MRSA by PCR: NEGATIVE

## 2020-02-12 LAB — SAMPLE TO BLOOD BANK

## 2020-02-12 LAB — PROTIME-INR
INR: 1.2 (ref 0.8–1.2)
INR: 1.2 (ref 0.8–1.2)
Prothrombin Time: 14.5 seconds (ref 11.4–15.2)
Prothrombin Time: 14.7 seconds (ref 11.4–15.2)

## 2020-02-12 LAB — PHOSPHORUS: Phosphorus: 6.2 mg/dL — ABNORMAL HIGH (ref 2.5–4.6)

## 2020-02-12 LAB — RESP PANEL BY RT-PCR (FLU A&B, COVID) ARPGX2
Influenza A by PCR: NEGATIVE
Influenza B by PCR: NEGATIVE
SARS Coronavirus 2 by RT PCR: NEGATIVE

## 2020-02-12 LAB — CBG MONITORING, ED
Glucose-Capillary: 437 mg/dL — ABNORMAL HIGH (ref 70–99)
Glucose-Capillary: 487 mg/dL — ABNORMAL HIGH (ref 70–99)

## 2020-02-12 LAB — I-STAT BETA HCG BLOOD, ED (MC, WL, AP ONLY): I-stat hCG, quantitative: 6.6 m[IU]/mL — ABNORMAL HIGH (ref <5)

## 2020-02-12 LAB — PROCALCITONIN: Procalcitonin: 3.25 ng/mL

## 2020-02-12 LAB — OSMOLALITY: Osmolality: 337 mOsm/kg (ref 275–295)

## 2020-02-12 LAB — LIPASE, BLOOD
Lipase: 51 U/L (ref 11–51)
Lipase: 46 U/L (ref 11–51)

## 2020-02-12 LAB — ETHANOL: Alcohol, Ethyl (B): <10 mg/dL (ref <10)

## 2020-02-12 LAB — CORTISOL: Cortisol, Plasma: 55.8 ug/dL

## 2020-02-12 LAB — AMYLASE: Amylase: 112 U/L — ABNORMAL HIGH (ref 28–100)

## 2020-02-12 MED ORDER — INSULIN REGULAR(HUMAN) IN NACL 100-0.9 UT/100ML-% IV SOLN
INTRAVENOUS | Status: DC
  Administered 2020-02-12: 11 [IU]/h via INTRAVENOUS
  Filled 2020-02-12 (×2): qty 100

## 2020-02-12 MED ORDER — AMIODARONE HCL IN DEXTROSE 360-4.14 MG/200ML-% IV SOLN
60.0000 mg/h | INTRAVENOUS | Status: DC
  Administered 2020-02-12: 60 mg/h via INTRAVENOUS
  Filled 2020-02-12: qty 200

## 2020-02-12 MED ORDER — INSULIN ASPART 100 UNIT/ML ~~LOC~~ SOLN
10.0000 [IU] | Freq: Once | SUBCUTANEOUS | Status: AC
  Administered 2020-02-12: 10 [IU] via INTRAVENOUS

## 2020-02-12 MED ORDER — LACTATED RINGERS IV BOLUS: 20.0000 mL/kg | Freq: Once | INTRAVENOUS | Status: DC

## 2020-02-12 MED ORDER — MORPHINE SULFATE (PF) 4 MG/ML IV SOLN
4.0000 mg | Freq: Once | INTRAVENOUS | Status: AC
  Administered 2020-02-12: 4 mg via INTRAVENOUS
  Filled 2020-02-12: qty 1

## 2020-02-12 MED ORDER — AMIODARONE LOAD VIA INFUSION
150.0000 mg | Freq: Once | INTRAVENOUS | Status: AC
  Administered 2020-02-12: 150 mg via INTRAVENOUS
  Filled 2020-02-12: qty 83.34

## 2020-02-12 MED ORDER — AMIODARONE HCL IN DEXTROSE 360-4.14 MG/200ML-% IV SOLN
30.0000 mg/h | INTRAVENOUS | Status: DC
  Administered 2020-02-12 – 2020-02-13 (×3): 30 mg/h via INTRAVENOUS
  Filled 2020-02-12 (×3): qty 200

## 2020-02-12 MED ORDER — DEXTROSE 50 % IV SOLN: 0.0000 mL | INTRAVENOUS | Status: DC | PRN

## 2020-02-12 MED ORDER — SODIUM CHLORIDE 0.9% FLUSH
10.0000 mL | Freq: Two times a day (BID) | INTRAVENOUS | Status: DC
  Administered 2020-02-13: 10 mL

## 2020-02-12 MED ORDER — HEPARIN SODIUM (PORCINE) 5000 UNIT/ML IJ SOLN
5000.0000 [IU] | Freq: Three times a day (TID) | INTRAMUSCULAR | Status: DC
  Administered 2020-02-12 – 2020-02-15 (×10): 5000 [IU] via SUBCUTANEOUS
  Filled 2020-02-12 (×10): qty 1

## 2020-02-12 MED ORDER — PANTOPRAZOLE SODIUM 40 MG IV SOLR
40.0000 mg | Freq: Every day | INTRAVENOUS | Status: DC
  Administered 2020-02-12: 40 mg via INTRAVENOUS
  Filled 2020-02-12: qty 40

## 2020-02-12 MED ORDER — LIDOCAINE HCL (PF) 1 % IJ SOLN
INTRAMUSCULAR | Status: AC
  Administered 2020-02-12: 3 mL via SUBCUTANEOUS
  Filled 2020-02-12: qty 30

## 2020-02-12 MED ORDER — DEXTROSE IN LACTATED RINGERS 5 % IV SOLN: INTRAVENOUS | Status: DC

## 2020-02-12 MED ORDER — LACTATED RINGERS IV SOLN: INTRAVENOUS | Status: DC

## 2020-02-12 MED ORDER — POLYETHYLENE GLYCOL 3350 17 G PO PACK: 17.0000 g | PACK | Freq: Every day | ORAL | Status: DC | PRN

## 2020-02-12 MED ORDER — INSULIN DETEMIR 100 UNIT/ML ~~LOC~~ SOLN
8.0000 [IU] | Freq: Two times a day (BID) | SUBCUTANEOUS | Status: DC
  Administered 2020-02-13 – 2020-02-15 (×5): 8 [IU] via SUBCUTANEOUS
  Filled 2020-02-12 (×8): qty 0.08

## 2020-02-12 MED ORDER — SODIUM CHLORIDE 0.9% FLUSH: 10.0000 mL | INTRAVENOUS | Status: DC | PRN

## 2020-02-12 MED ORDER — INSULIN ASPART 100 UNIT/ML ~~LOC~~ SOLN
1.0000 [IU] | SUBCUTANEOUS | Status: DC
  Administered 2020-02-13 (×2): 2 [IU] via SUBCUTANEOUS
  Administered 2020-02-13 – 2020-02-14 (×2): 1 [IU] via SUBCUTANEOUS
  Administered 2020-02-14: 2 [IU] via SUBCUTANEOUS
  Administered 2020-02-15: 1 [IU] via SUBCUTANEOUS
  Administered 2020-02-15: 3 [IU] via SUBCUTANEOUS

## 2020-02-12 MED ORDER — DOCUSATE SODIUM 100 MG PO CAPS: 100.0000 mg | ORAL_CAPSULE | Freq: Two times a day (BID) | ORAL | Status: DC | PRN

## 2020-02-12 MED ORDER — LORAZEPAM 2 MG/ML IJ SOLN
INTRAMUSCULAR | Status: AC
  Filled 2020-02-12: qty 1

## 2020-02-12 MED ORDER — LIDOCAINE HCL (CARDIAC) PF 100 MG/5ML IV SOSY
PREFILLED_SYRINGE | INTRAVENOUS | Status: AC | PRN
  Administered 2020-02-12: 100 mg via INTRAVENOUS

## 2020-02-12 MED ORDER — LABETALOL HCL 5 MG/ML IV SOLN
20.0000 mg | Freq: Once | INTRAVENOUS | Status: AC
  Administered 2020-02-12: 20 mg via INTRAVENOUS
  Filled 2020-02-12: qty 4

## 2020-02-12 MED ORDER — ADENOSINE 6 MG/2ML IV SOLN
INTRAVENOUS | Status: AC
  Filled 2020-02-12: qty 2

## 2020-02-12 MED ORDER — SODIUM CHLORIDE 0.9 % IV SOLN: INTRAVENOUS | Status: DC

## 2020-02-12 NOTE — Progress Notes (Signed)
eLink Physician-Brief Progress Note Patient Name: [name] DOB: 1986-02-26 MRN: [id number]   Date of Service  02/12/2020  HPI/Events of Note  Hyperglycemia - Blood glucose = 167. EndoTool prompting to transition off of IV insulin infusion.   eICU Interventions  Plan: 1. Levemir 8 units now and Q 12 hours + Q 4 hour sensitive Novolog SSI. 2. D/C Insulin IV infusion 2 hours after first Levemir dose.      Intervention Category Major Interventions: Hyperglycemia - active titration of insulin therapy  Lysle Dingwall 02/12/2020, 11:56 PM

## 2020-02-12 NOTE — Progress Notes (Signed)
ABG attempted times 2. MD notified VBG to be ordered.

## 2020-02-12 NOTE — ED Triage Notes (Signed)
Pt arrived via [facility] from home for a witnessed seizure that lasted 15 sec. Per EMS when they arrived on scene pt was post tictal. Pt had dialysis yesterday. Per EMS pt c/o HA, dizziness and severe abdominal pain. Pt not answering my questions, pt just keeps crying.

## 2020-02-12 NOTE — Consult Note (Signed)
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 Unclear if spells of shaking were captured on EEG. She had another event 05/01/2018, again with BG in the 900s, "per EDP description, patient was redirectable during the spasmotic activity and there was low suspicion for true seizures"    This admission, Glucose on arrive was in the 500s, Cr 10.4, iCal 0.97, AG 29, Phos 6.2, Serum osm 337, Lactate 4.0  Course complicated with SVT > 564, started on amiodarone, with BP 201/138 on arrival -- this was in the setting of seizure-like activity. She was very somnolent afterwards, but did not require intubation and her mental status has gradually been clearing though she remains confused. DKA is being managed well and gap is closing (latest value 20)  EEG was attempted but could not be placed due to weave glued to her scalp   Was unable to reach family for additional history  ROS: Unable to obtain due to altered mental status (confusion/disorientation), though at this time patient denies any pain, headache, nausea, abdominal pain (mild reflux), double vision, hearing issues,   Past Medical History:  Diagnosis Date  . Anemia   . Chronic kidney disease    kidney transplant 07  . Diabetes mellitus    Pt reports diagnosis in June 2011, Type 2  . GERD (gastroesophageal reflux disease)   . Hyperlipidemia   . Hypertension   . 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 Pyelonephritis 06/23/2014  . Seasonal allergies   . UTI (urinary tract infection) 01/09/2015  . 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   Qualifier: Diagnosis of  By: Carlena Sax  MD, Colletta [place]      Family History  Problem Relation Age of Onset  . Arthritis Mother   . Hypertension Mother   . Aneurysm Mother        died of brain aneurysm  . CAD Father        Has 3 stents  . Diabetes Father        borderline  . Early death Brother        Died in war   Current Outpatient Medications  Medication Instructions  . ACCU-CHEK SOFTCLIX LANCETS lancets Use to check blood sugar 4 times per day.  Marland Kitchen acetaminophen (TYLENOL) 1,000 mg, Oral, Every 6 hours PRN  . b complex-vitamin c-folic acid (NEPHRO-VITE) 0.8 MG TABS tablet 1 tablet, Oral, Every M-W-F  . calcium acetate (PHOSLO) 332-9,518 mg, Oral, See admin instructions, Take 2 capsules (1334 mg) by mouth up to 4 times daily with meals, take 1 capsule (667 mg) with snacks  . Calcium Carbonate Antacid (CALCIUM CARBONATE, DOSED IN MG ELEMENTAL CALCIUM,) 1250 MG/5ML SUSP 5 mLs, Oral, 3 times daily  . carvedilol (COREG) 12.5 mg, Oral, 2 times daily with meals  . cetirizine (ZYRTEC) 10 mg, Oral, Daily PRN  . D 1000 1,000 Units, Oral, Every other day  . diclofenac Sodium (VOLTAREN) 2 g, Topical,  4 times daily  . dicyclomine (BENTYL) 20 mg, Oral, 2 times daily PRN  . fluticasone (FLONASE) 50 MCG/ACT nasal spray 2 sprays, Each Nare, Daily PRN  . gabapentin (NEURONTIN) 400 mg, Oral, 3 times daily  . hydrOXYzine (ATARAX/VISTARIL) 50 mg, Oral, 2 times daily PRN  . insulin aspart (NOVOLOG FLEXPEN) 2-10 Units, Subcutaneous, 3 times daily with meals, as needed for blood sugar management (sliding scale)  . insulin degludec (TRESIBA) 60 Units, Subcutaneous, 2 times daily, Reports taking 24 units QAM  . lidocaine-prilocaine (EMLA) cream 1 application, Topical, See admin instructions, Apply topically one hour prior to dialysis on Monday, Wednesday, Friday  . loperamide  (IMODIUM) 2 mg, Oral, 2 times daily  . Methoxy PEG-Epoetin Beta (MIRCERA IJ) Mircera  . metoCLOPramide (REGLAN) 10 mg, Oral, 3 times daily before meals  . metoprolol tartrate (LOPRESSOR) 25 mg, Oral, 2 times daily  . oxyCODONE-acetaminophen (PERCOCET/ROXICET) 5-325 MG tablet No dose, route, or frequency recorded.  . pantoprazole (PROTONIX) 40 mg, Oral, 2 times daily  . promethazine (PHENERGAN) 25 mg, Oral, Every 6 hours PRN  . promethazine (PHENERGAN) 12.5 mg, 5 times daily PRN  . simvastatin (ZOCOR) 20 mg, Oral, Daily at bedtime    Social History:  reports that she has never smoked. She has never used smokeless tobacco. She reports that she does not drink alcohol and does not use drugs.   Exam: Current vital signs: BP (!) 144/104   Pulse 96   Temp 98.5 F (36.9 C) (Oral)   Resp 12   Ht 5\' 6"  (1.676 m)   Wt 85.4 kg   SpO2 100%   BMI 30.39 kg/m  Vital signs in last 24 hours: Temp:  [97.9 F (36.6 C)-98.5 F (36.9 C)] 98.5 F (36.9 C) (11/30 2007) Pulse Rate:  [90-136] 96 (11/30 2000) Resp:  [12-38] 12 (11/30 2000) BP: (120-201)/(97-143) 144/104 (11/30 2000) SpO2:  [77 %-100 %] 100 % (11/30 2000) FiO2 (%):  [36 %] 36 % (11/30 1256) Weight:  [85.4 kg] 85.4 kg (11/30 0837)   Physical Exam  Constitutional: Appears well-developed and well-nourished.  Psych: Affect appropriate to situation, confused but pleasant and cooperative, perseverative and poor attention Eyes: No scleral injection HENT: No OP obstruction, weave in place MSK: no joint deformities.  Cardiovascular: Normal rate and regular rhythm.  Respiratory: Effort normal, non-labored breathing GI: Soft.  No distension. There is no tenderness.  Skin: WDI  Neuro: Mental Status: Patient is awake, alert, disoriented to time and place Patient is unable to give any significant history  Able to follow simple commands (sometimes requires repetition) and name Cranial Nerves: II: Visual Fields are full. Pupils are  equal, round, and reactive to light.  3 -> 2 mm III,IV, VI: EOMI without ptosis or diploplia.  V: Facial sensation is symmetric to temperature VII: Facial movement is symmetric.  VIII: hearing is intact to voice X: Uvula elevates symmetrically XI: Shoulder shrug is symmetric. XII: tongue is midline without atrophy or fasciculations.  Motor: Tone is normal. Bulk is normal. 5/5 strength was present in all four extremities. Sensory: Sensation is symmetric to light touch and temperature in the arms and legs. Deep Tendon Reflexes: 2+ and symmetric in the biceps and patellae.  Plantars: Toes are mute bilaterally  Cerebellar: FNF and toe to hand are intact   I have reviewed labs in epic and the results pertinent to this consultation are: As listed in HPI above   I have reviewed the images obtained:  HCT without acute intracranial  process (presonally reviewed)    Impression: 34 year old woman w/ likely provoked GTC (given SVT and BP elevation) in the setting of DKA. Exam non-focal. Additional history will be critical to obtain for long-term management but unfortunately patient too confused and family unreachable at this time.   Recommendations: - Routine EEG - MRI brain  - Patient must be counseled to report event to [facility] and abstain from driving as well as any other activities where sudden LoC would result in serious injury or death if no bystander is present and able to assist (such as bathing, swimming, using ladders) - Continued excellent DKA management by primary team - If MRI w/ structural lesion concerning for seizure focus or EEG w/ epileptiform activity, please reach out to neurology for antiseizure medication recommendations - If MRI and EEG are normal, outpatient neurology follow-up 3-4 weeks after discharge is appropriate (referral placed)  45 minutes of critical care time in this patient with likely [place] MD-PhD [facility] [phone]

## 2020-02-12 NOTE — ED Notes (Signed)
Attempted to give report and was told charge nurse will call back.

## 2020-02-12 NOTE — ED Notes (Signed)
Dr Tomi Bamberger is aware of pt's high bp. We are waiting for IV team to put an IV in pt in order to give bp meds.

## 2020-02-12 NOTE — ED Notes (Signed)
Patient transported to CT 

## 2020-02-12 NOTE — H&P (Signed)
NAME:  [name], MRN:  [id number], DOB:  1985/12/04, LOS: 0 ADMISSION DATE:  02/12/2020, CONSULTATION DATE: 10/12/2019 REFERRING MD: EDP, CHIEF COMPLAINT: Seizure activity  Brief History   34 year old female with a plethora of health issues noted to have seizure activity x2 SVT and hypertension.  History of present illness   34 year old female with extensive medical history as well documented below who was noted to have seizure activity at home.  She had hemodialysis on 02/11/2020.  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  She was taken to the CT scanner noted to have another seizure became extremely hypertensive and had SVT greater than 220.  Required cardioversion x1 and initiation of amiodarone drip.  Pulmonary critical care was called to the bedside due to the complexity of her care.  At the time of examination she would follow commands.  Blood pressure was now down in the 140s to 150s over 100.  Her heart rate declined to less than 100.  Again due to the complexity of her care pulmonary critical care admit her for at least 24 hours to the intensive care unit.  Note that cardiology, neurology, nephrology involvement consulted.  The EEG has been ordered.  CT of her head was negative at this time.  Past Medical History   Past Medical History:  Diagnosis Date  . Anemia   . Chronic kidney disease    kidney transplant 07  . Diabetes mellitus    Pt reports diagnosis in June 2011, Type 2  . GERD (gastroesophageal reflux disease)   . Hyperlipidemia   . Hypertension   . 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 Pseudoseizures ([place]) 12/22/2012  . Pyelonephritis 06/23/2014  . Seasonal allergies   . UTI (urinary tract infection) 01/09/2015  . 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   Qualifier: Diagnosis of  By: Carlena Sax  MD, [facility] Events   02/12/2020 seizure  activity 02/12/2020 SVT  Consults:  02/12/2020 cardiology 02/12/2020 renal 10/12/2019 neurology  Procedures:  Right femoral central line placed per EDP  Significant Diagnostic Tests:  02/12/2020 CT of the head unremarkable  Micro Data:  #32,021 Covid>>  Antimicrobials:    Interim history/subjective:  34 year old female with a plethora of health issues including end-stage renal disease, history of pseudoseizures who presented with seizures today.  She was taken to the CT scan which was negative by the way and noted to have a seizure at that time.  Also developed SVT and required cardioversion x1 and the initiation of a amiodarone drip.  She is noted to be extremely hypertensive 180/113 but this is now better.  Due to the complexity of her care pulmonary critical care has been asked to admit.  Objective   Blood pressure (!) 149/107, pulse (!) 123, temperature 97.9 F (36.6 C), temperature source Oral, resp. rate (!) 28, height 5\' 6"  (1.676 m), weight 85.4 kg, SpO2 98 %.       No intake or output data in the 24 hours ending 02/12/20 1231 Filed Weights   02/12/20 0837  Weight: 85.4 kg    Examination: General: Morbid obese female who is in no acute distress at rest HENT: No JVD or lymphadenopathy is appreciated Lungs: Decreased breath sounds in the bases Cardiovascular: Currently regular Abdomen: Extensive surgical scars noted well-healed Extremities: Positive edema, left upper extremity hemodialysis AV fistula noted Neuro: Somewhat  lethargic but will open eyes and ask for her mother GU: [facility] Problem list     Assessment & Plan:  Altered mental status with witnessed seizures x2 and carries a diagnosis of pseudoseizures. CT of the head negative Order EEG Neuro consult Admit to the intensive care unit  Hypertension poorly controlled Continue current interventions as her systolic pressure is now 160 with a diastolic of 90  SVT noted in CT scanner  requiring cardioversion 02/12/2020. Continue amiodarone for now Noted to be on beta-blockers question she has been off of while sick and is having rebound Resume beta-blockers Cardiology consult   End-stage renal disease with dialysis Monday Wednesdays and Fridays.  History of renal transplant 2007 is nonfunctioning. Nephrology consult Dialysis as needed  Diabetes type 1 with poor control CBG (last 3)  Recent Labs    02/12/20 0845  GLUCAP 437*   Sliding-scale insulin protocol May need insulin drip  XXX syndrome Monitor  Extensive abdominal surgery with a history of renal transplant Monitor intensive care unit     Diet: NPO Pain/Anxiety/Delirium protocol (if indicated): Not indicated VAP protocol (if indicated): Not indicated DVT prophylaxis: Heparin GI prophylaxis: PPI Glucose control: Sliding scale insulin continue insulin drip Mobility: Bedrest last date of multidisciplinary goals of care discussion.  Unable to perform on 02/12/2020 no family listed patient is somnolent Family and staff present none Summary of discussion done Follow up goals of care discussion due when able Code Status: Full Disposition: ICU  Labs   CBC: Recent Labs  Lab 02/12/20 1059 02/12/20 1101  HGB 13.9 14.3  HCT 41.0 10.9    Basic Metabolic Panel: Recent Labs  Lab 02/12/20 1049 02/12/20 1059 02/12/20 1101  NA 137 135 135  K 5.1 5.0 5.1  CL 86* 91*  --   CO2 22  --   --   GLUCOSE 560* 564*  --   BUN 52* 50*  --   CREATININE 10.34* 10.40*  --   CALCIUM 9.3  --   --    GFR: Estimated Creatinine Clearance: 8.2 mL/min (A) (by C-G formula based on SCr of 10.4 mg/dL (H)). No results for input(s): PROCALCITON, WBC, LATICACIDVEN in the last 168 hours.  Liver Function Tests: Recent Labs  Lab 02/12/20 1049  AST 35  ALT 27  ALKPHOS 65  BILITOT 0.9  PROT 8.9*  ALBUMIN 4.1   Recent Labs  Lab 02/12/20 1049  LIPASE 51   No results for input(s): AMMONIA in the last 168  hours.  ABG    Component Value Date/Time   PHART 7.433 06/19/2019 0309   PCO2ART 51.1 (H) 06/19/2019 0309   PO2ART 66.0 (L) 06/19/2019 0309   HCO3 26.8 02/12/2020 1101   TCO2 28 02/12/2020 1101   ACIDBASEDEF 8.0 (H) 05/01/2018 1030   O2SAT 89.0 02/12/2020 1101     Coagulation Profile: No results for input(s): INR, PROTIME in the last 168 hours.  Cardiac Enzymes: No results for input(s): CKTOTAL, CKMB, CKMBINDEX, TROPONINI in the last 168 hours.  HbA1C: Hgb A1c MFr Bld  Date/Time Value Ref Range Status  06/17/2019 02:34 AM 9.2 (H) 4.8 - 5.6 % Final    Comment:    (NOTE) Pre diabetes:          5.7%-6.4% Diabetes:              >6.4% Glycemic control for   <7.0% adults with diabetes   09/01/2018 01:24 PM 10.1 (H) 4.8 - 5.6 % Final  Comment:    (NOTE) Pre diabetes:          5.7%-6.4% Diabetes:              >6.4% Glycemic control for   <7.0% adults with diabetes     CBG: Recent Labs  Lab 02/12/20 0845  GLUCAP 48*    Review of Systems:   NA  Past Medical History  She,  has a past medical history of Anemia, Chronic kidney disease, Diabetes mellitus, GERD (gastroesophageal reflux disease), Hyperlipidemia, Hypertension, Kidney transplant recipient (2007), LEARNING DISABILITY (09/25/2007), Pseudoseizures ([place]) (12/22/2012), Pyelonephritis (06/23/2014), Seasonal allergies, UTI (urinary tract infection) (01/09/2015), and XXX SYNDROME (11/19/2008).   Surgical History    Past Surgical History:  Procedure Laterality Date  . ARTERIOVENOUS GRAFT PLACEMENT Bilateral    "neither work" (10/24/2017)  . AV FISTULA PLACEMENT Left 10/26/2018   Procedure: CREATION OF ARTERIOVENOUS FISTULA  LEFT ARM;  Surgeon: [name], MD;  Location: [facility];  Service: Vascular;  Laterality: Left;  . BASCILIC VEIN TRANSPOSITION Left 12/21/2018   Procedure: Left arm BASILIC VEIN TRANSPOSITION SECOND STAGE;  Surgeon: [name], MD;  Location: [facility];  Service: Vascular;   Laterality: Left;  . CHOLECYSTECTOMY N/A 06/30/2017   Procedure: LAPAROSCOPIC CHOLECYSTECTOMY WITH INTRAOPERATIVE CHOLANGIOGRAM;  Surgeon: Excell Seltzer, MD;  Location: [facility];  Service: General;  Laterality: N/A;  . ESOPHAGOGASTRODUODENOSCOPY (EGD) WITH PROPOFOL N/A 07/04/2017   Procedure: ESOPHAGOGASTRODUODENOSCOPY (EGD) WITH PROPOFOL;  Surgeon: Clarene Essex, MD;  Location: [facility];  Service: Endoscopy;  Laterality: N/A;  . INSERTION OF DIALYSIS CATHETER N/A 03/20/2018   Procedure: INSERTION OF TUNNELED DIALYSIS CATHETER - RIGHT INTERANL JUGULAR PLACEMENT;  Surgeon: Angelia Mould, MD;  Location: [facility];  Service: Vascular;  Laterality: N/A;  . IR GUIDED [place]  10/28/2018  . KIDNEY TRANSPLANT  2007  . PARATHYROIDECTOMY  ?2012   "3/4 removed" (10/24/2017)  . RENAL BIOPSY Bilateral 2003  . UPPER EXTREMITY VENOGRAPHY Bilateral 10/19/2018   Procedure: UPPER EXTREMITY VENOGRAPHY;  Surgeon: [name], MD;  Location: [facility];  Service: Cardiovascular;  Laterality: Bilateral;  Bilateral      Social History   reports that she has never smoked. She has never used smokeless tobacco. She reports that she does not drink alcohol and does not use drugs.   Family History   Her family history includes Aneurysm in her mother; Arthritis in her mother; CAD in her father; Diabetes in her father; Early death in her brother; Hypertension in her mother.   Allergies Allergies  Allergen Reactions  . Benadryl [Diphenhydramine-Zinc Acetate] Anaphylaxis    Stopped breathing in ICU  . Doxycycline Shortness Of Breath  . Motrin [Ibuprofen] Shortness Of Breath and Itching    Per pt  . Banana Other (See Comments)    Sick on the stomach  . Iron Dextran Other (See Comments)    REACTION: vein irritation  . Shellfish Allergy Hives  . Chlorhexidine Itching     Home Medications  Prior to Admission medications   Medication Sig Start Date End Date Taking?  Authorizing Provider  Calcium Carbonate Antacid (CALCIUM CARBONATE, DOSED IN MG ELEMENTAL CALCIUM,) 1250 MG/5ML SUSP Take 5 mLs by mouth 3 (three) times daily.  11/17/18  Yes [provider]  diclofenac Sodium (VOLTAREN) 1 % GEL Apply 2 g topically 4 (four) times daily.   Yes [provider]  dicyclomine (BENTYL) 20 MG tablet Take 1 tablet (20 mg total) by mouth 2 (two) times  daily as needed (stomach issues.). 06/22/19  Yes Dessa Phi, DO  fluticasone (FLONASE) 50 MCG/ACT nasal spray Place 2 sprays into both nostrils daily as needed for allergies. 09/03/18  Yes [name], MD  gabapentin (NEURONTIN) 400 MG capsule Take 400 mg by mouth 3 (three) times daily. 04/29/19  Yes [provider]  hydrOXYzine (ATARAX/VISTARIL) 50 MG tablet Take 1 tablet (50 mg total) by mouth 2 (two) times daily as needed for itching. 02/06/18  Yes [name], Ripudeep K, MD  insulin degludec (TRESIBA) 100 UNIT/ML SOPN FlexTouch Pen Inject 0.6 mLs (60 Units total) into the skin 2 (two) times daily. Reports taking 24 units QAM Patient taking differently: Inject 20 Units into the skin daily before breakfast.  03/06/18  Yes Charlynne Cousins, MD  loperamide (IMODIUM) 1 MG/5ML solution Take 2 mg by mouth 2 (two) times daily.   Yes [provider]  metoCLOPramide (REGLAN) 10 MG tablet Take 1 tablet (10 mg total) by mouth 3 (three) times daily before meals. 06/22/19  Yes Dessa Phi, DO  metoprolol tartrate (LOPRESSOR) 25 MG tablet Take 25 mg by mouth 2 (two) times daily.   Yes [provider]  ACCU-CHEK SOFTCLIX LANCETS lancets Use to check blood sugar 4 times per day. 12/29/15   Renato Shin, MD  acetaminophen (TYLENOL) 500 MG tablet Take 1,000 mg by mouth every 6 (six) hours as needed for headache (pain).     [provider]  b complex-vitamin c-folic acid (NEPHRO-VITE) 0.8 MG TABS tablet Take 1 tablet by mouth every Monday, Wednesday, and Friday.  07/19/18   [provider]  calcium acetate (PHOSLO) 667 MG capsule Take 667-1,334 mg by mouth See admin instructions. Take 2 capsules (1334 mg) by mouth up to 4 times daily with meals, take 1 capsule (667 mg) with snacks 11/24/18   [provider]  carvedilol (COREG) 12.5 MG tablet Take 1 tablet (12.5 mg total) by mouth 2 (two) times daily with a meal. Patient taking differently: Take 12.5 mg by mouth 2 (two) times daily as needed (SBP >150).  10/12/19   Little Ishikawa, MD  cetirizine (ZYRTEC) 10 MG tablet Take 10 mg by mouth daily as needed for allergies.    [provider]  D 1000 25 MCG (1000 UT) capsule Take 1,000 Units by mouth every other day.  04/27/19   [provider]  insulin aspart (NOVOLOG FLEXPEN) 100 UNIT/ML FlexPen Inject 2-10 Units into the skin 3 (three) times daily with meals. as needed for blood sugar management (sliding scale)    [provider]  lidocaine-prilocaine (EMLA) cream Apply 1 application topically See admin instructions. Apply topically one hour prior to dialysis on Monday, Wednesday, Friday 09/24/19   [provider]  Methoxy PEG-Epoetin Beta (MIRCERA IJ) Mircera 07/23/19 07/21/20  [provider]  oxyCODONE-acetaminophen (PERCOCET/ROXICET) 5-325 MG tablet  07/19/19   [provider]  pantoprazole (PROTONIX) 40 MG tablet Take 40 mg by mouth 2 (two) times daily.     [provider]  promethazine (PHENERGAN) 12.5 MG suppository Place 12.5 mg rectally 5 (five) times daily as needed for nausea or vomiting. Patient not taking: Reported on 12/11/2019    [provider]  promethazine (PHENERGAN) 25 MG tablet Take 25 mg by mouth every 6 (six) hours as needed for nausea or vomiting.  10/13/19   [provider]  simvastatin (ZOCOR) 20 MG tablet Take 20 mg by mouth at bedtime.     [provider]  amLODipine (  NORVASC) 10 MG tablet Take 1 tablet (10 mg total) by mouth daily. 03/25/18 02/07/19  Thurnell Lose,  MD     Critical care time: 45 min     Richardson Landry Kadeidra Coryell ACNP Acute Care Nurse Practitioner [place] Please consult Amion 02/12/2020, 12:31 PM

## 2020-02-12 NOTE — Progress Notes (Addendum)
Called to speak with RN to notify that last tech  is here till 82. Hair may or may not get removed in the next 30 mins explained on call tech can get EEG or it can be priority in the morning.

## 2020-02-12 NOTE — Progress Notes (Signed)
Tech arrived to room unable to do EEG due to pt has sew in that appears to look glued down. Ordering Provider is notified and will be back in touch with how to proceed. Will try back as schedule permits

## 2020-02-12 NOTE — ED Provider Notes (Addendum)
[facility] EMERGENCY DEPARTMENT Provider Note   CSN: [id number] Arrival date & time: 02/12/20  0827     History Chief Complaint  Patient presents with  . Seizures    [name] is a 34 y.o. female.  HPI   Patient presents to the ED for evaluation of a seizure.  According to the EMS report the patient had a witnessed seizure at home that lasted approximately 15 seconds.  According to the EMS report the patient was postictal when they arrived.  Patient is currently not answering any questions.  Unable to get any further history.  Medical records indicate the patient is on dialysis.  Per report, last dialyzed yesterday.  Patient does have history of intractable nausea vomiting and gastroparesis.  Last visit to the ED was in August for this issue.  Past Medical History:  Diagnosis Date  . Anemia   . Chronic kidney disease    kidney transplant 07  . Diabetes mellitus    Pt reports diagnosis in June 2011, Type 2  . GERD (gastroesophageal reflux disease)   . Hyperlipidemia   . Hypertension   . 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 Pyelonephritis 06/23/2014  . Seasonal allergies   . UTI (urinary tract infection) 01/09/2015  . 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 Hypotension 10/16/2019  . Pain in the abdomen 10/14/2019  . Hyperkalemia 10/11/2019  . Hypertensive urgency 10/11/2019  . Hyperglycemia 10/11/2019  . Intractable abdominal pain 06/19/2019  . Sinus tachycardia 06/19/2019  . Intra-abdominal abscess ([place]) 10/28/2018  . Hypertensive emergency 09/01/2018  . DKA (diabetic ketoacidosis) ([place]) 05/01/2018  . ESRD on hemodialysis ([place]) 05/01/2018  . Renal failure 03/18/2018  . Cellulitis of left leg 03/01/2018  .  Hyperlipidemia 03/01/2018  . Hypocalcemia 03/01/2018  . Metabolic acidosis 02/63/7858  . Hypokalemia 02/03/2018  . Incontinence of bowel 02/03/2018  . Chronic pain 11/11/2017  . Diabetic gastroparesis ([place])   . Constipation 07/13/2017  . Hematemesis 07/02/2017  . Dehydration 07/02/2017  . Chronic cholecystitis 06/29/2017  . Type 1 diabetes mellitus with complication, uncontrolled ([place]) 05/25/2015  . Intractable nausea and vomiting 01/09/2015  . Renal transplant recipient   . Immunosuppressed status ([place])   . Chest pain 12/22/2012  . 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 Chronic kidney disease 01/04/2010  . Chronic kidney disease, stage II (mild) 01/04/2010  . OVARIAN FAILURE, PREMATURE 03/11/2009  . 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 OBESITY 09/25/2007  . Anemia due to chronic kidney disease 09/25/2007  . LEARNING DISABILITY 09/25/2007  . Essential hypertension, benign 09/25/2007    Past Surgical History:  Procedure Laterality Date  . ARTERIOVENOUS GRAFT PLACEMENT Bilateral    "neither work" (10/24/2017)  . AV FISTULA PLACEMENT Left 10/26/2018   Procedure: CREATION OF ARTERIOVENOUS FISTULA  LEFT ARM;  Surgeon: [name], MD;  Location: [facility];  Service: Vascular;  Laterality: Left;  . BASCILIC VEIN TRANSPOSITION Left 12/21/2018   Procedure: Left arm BASILIC VEIN TRANSPOSITION SECOND STAGE;  Surgeon: [name], MD;  Location: [facility];  Service: Vascular;  Laterality: Left;  . CHOLECYSTECTOMY N/A 06/30/2017   Procedure: LAPAROSCOPIC  CHOLECYSTECTOMY WITH INTRAOPERATIVE CHOLANGIOGRAM;  Surgeon: Excell Seltzer, MD;  Location: [facility];  Service: General;  Laterality: N/A;  . ESOPHAGOGASTRODUODENOSCOPY (EGD) WITH PROPOFOL N/A 07/04/2017   Procedure: ESOPHAGOGASTRODUODENOSCOPY (EGD) WITH PROPOFOL;  Surgeon: Clarene Essex, MD;  Location: [facility];   Service: Endoscopy;  Laterality: N/A;  . INSERTION OF DIALYSIS CATHETER N/A 03/20/2018   Procedure: INSERTION OF TUNNELED DIALYSIS CATHETER - RIGHT INTERANL JUGULAR PLACEMENT;  Surgeon: Angelia Mould, MD;  Location: [facility];  Service: Vascular;  Laterality: N/A;  . IR GUIDED [place]  10/28/2018  . KIDNEY TRANSPLANT  2007  . PARATHYROIDECTOMY  ?2012   "3/4 removed" (10/24/2017)  . RENAL BIOPSY Bilateral 2003  . UPPER EXTREMITY VENOGRAPHY Bilateral 10/19/2018   Procedure: UPPER EXTREMITY VENOGRAPHY;  Surgeon: [name], MD;  Location: [facility];  Service: Cardiovascular;  Laterality: Bilateral;  Bilateral      OB History    Gravida  0   Para  0   Term  0   Preterm  0   AB  0   Living  0     SAB  0   TAB  0   Ectopic  0   Multiple  0   Live Births  0           Family History  Problem Relation Age of Onset  . Arthritis Mother   . Hypertension Mother   . Aneurysm Mother        died of brain aneurysm  . CAD Father        Has 3 stents  . Diabetes Father        borderline  . Early death Brother        Died in war    Social History   Tobacco Use  . Smoking status: Never Smoker  . Smokeless tobacco: Never Used  Vaping Use  . Vaping Use: Never used  Substance Use Topics  . Alcohol use: No  . Drug use: No    Home Medications Prior to Admission medications   Medication Sig Start Date End Date Taking? Authorizing Provider  Calcium Carbonate Antacid (CALCIUM CARBONATE, DOSED IN MG ELEMENTAL CALCIUM,) 1250 MG/5ML SUSP Take 5 mLs by mouth 3 (three) times daily.  11/17/18  Yes [provider]  diclofenac Sodium (VOLTAREN) 1 % GEL Apply 2 g topically 4 (four) times daily.   Yes [provider]  dicyclomine (BENTYL) 20 MG tablet Take 1 tablet (20 mg total) by mouth 2 (two) times daily as needed (stomach issues.). 06/22/19  Yes Dessa Phi, DO  fluticasone (FLONASE) 50 MCG/ACT nasal spray Place 2 sprays into  both nostrils daily as needed for allergies. 09/03/18  Yes [name], MD  gabapentin (NEURONTIN) 400 MG capsule Take 400 mg by mouth 3 (three) times daily. 04/29/19  Yes [provider]  hydrOXYzine (ATARAX/VISTARIL) 50 MG tablet Take 1 tablet (50 mg total) by mouth 2 (two) times daily as needed for itching. 02/06/18  Yes [name], Ripudeep K, MD  insulin degludec (TRESIBA) 100 UNIT/ML SOPN FlexTouch Pen Inject 0.6 mLs (60 Units total) into the skin 2 (two) times daily. Reports taking 24 units QAM Patient taking differently: Inject 20 Units into the skin daily before breakfast.  03/06/18  Yes Charlynne Cousins, MD  loperamide (IMODIUM) 1 MG/5ML solution Take 2 mg by mouth 2 (two) times daily.   Yes [provider]  metoCLOPramide (REGLAN) 10 MG tablet Take 1 tablet (10 mg total)  by mouth 3 (three) times daily before meals. 06/22/19  Yes Dessa Phi, DO  metoprolol tartrate (LOPRESSOR) 25 MG tablet Take 25 mg by mouth 2 (two) times daily.   Yes [provider]  ACCU-CHEK SOFTCLIX LANCETS lancets Use to check blood sugar 4 times per day. 12/29/15   Renato Shin, MD  acetaminophen (TYLENOL) 500 MG tablet Take 1,000 mg by mouth every 6 (six) hours as needed for headache (pain).     [provider]  b complex-vitamin c-folic acid (NEPHRO-VITE) 0.8 MG TABS tablet Take 1 tablet by mouth every Monday, Wednesday, and Friday.  07/19/18   [provider]  calcium acetate (PHOSLO) 667 MG capsule Take 667-1,334 mg by mouth See admin instructions. Take 2 capsules (1334 mg) by mouth up to 4 times daily with meals, take 1 capsule (667 mg) with snacks 11/24/18   [provider]  carvedilol (COREG) 12.5 MG tablet Take 1 tablet (12.5 mg total) by mouth 2 (two) times daily with a meal. Patient taking differently: Take 12.5 mg by mouth 2 (two) times daily as needed (SBP >150).  10/12/19   Little Ishikawa, MD  cetirizine (ZYRTEC) 10 MG tablet Take 10 mg by mouth  daily as needed for allergies.    [provider]  D 1000 25 MCG (1000 UT) capsule Take 1,000 Units by mouth every other day.  04/27/19   [provider]  insulin aspart (NOVOLOG FLEXPEN) 100 UNIT/ML FlexPen Inject 2-10 Units into the skin 3 (three) times daily with meals. as needed for blood sugar management (sliding scale)    [provider]  lidocaine-prilocaine (EMLA) cream Apply 1 application topically See admin instructions. Apply topically one hour prior to dialysis on Monday, Wednesday, Friday 09/24/19   [provider]  Methoxy PEG-Epoetin Beta (MIRCERA IJ) Mircera 07/23/19 07/21/20  [provider]  oxyCODONE-acetaminophen (PERCOCET/ROXICET) 5-325 MG tablet  07/19/19   [provider]  pantoprazole (PROTONIX) 40 MG tablet Take 40 mg by mouth 2 (two) times daily.     [provider]  promethazine (PHENERGAN) 12.5 MG suppository Place 12.5 mg rectally 5 (five) times daily as needed for nausea or vomiting. Patient not taking: Reported on 12/11/2019    [provider]  promethazine (PHENERGAN) 25 MG tablet Take 25 mg by mouth every 6 (six) hours as needed for nausea or vomiting.  10/13/19   [provider]  simvastatin (ZOCOR) 20 MG tablet Take 20 mg by mouth at bedtime.     [provider]  amLODipine (NORVASC) 10 MG tablet Take 1 tablet (10 mg total) by mouth daily. 03/25/18 02/07/19  Thurnell Lose, MD    Allergies    Benadryl [diphenhydramine-zinc acetate], Doxycycline, Motrin [ibuprofen], Banana, Iron dextran, Shellfish allergy, and Chlorhexidine  Review of Systems   Review of Systems  All other systems reviewed and are negative.   Physical Exam Updated Vital Signs BP (!) 184/126   Pulse (!) 123   Temp 97.9 F (36.6 C) (Oral)   Resp 17   Ht 1.676 m (5\' 6" )   Wt 85.4 kg   SpO2 98%   BMI 30.39 kg/m   Physical Exam Vitals and nursing note reviewed.  Constitutional:      Appearance: She is  well-developed. She is not toxic-appearing or diaphoretic.  HENT:     Head: Normocephalic and atraumatic.     Right Ear: External ear normal.     Left Ear: External ear normal.  Eyes:  General: No scleral icterus.       Right eye: No discharge.        Left eye: No discharge.     Conjunctiva/sclera: Conjunctivae normal.  Neck:     Trachea: No tracheal deviation.  Cardiovascular:     Rate and Rhythm: Normal rate and regular rhythm.  Pulmonary:     Effort: Pulmonary effort is normal. No respiratory distress.     Breath sounds: Normal breath sounds. No stridor. No wheezing or rales.  Abdominal:     General: Bowel sounds are normal. There is no distension.     Palpations: Abdomen is soft.     Tenderness: There is no abdominal tenderness. There is no guarding or rebound.  Musculoskeletal:        General: No tenderness.     Cervical back: Neck supple.     Right lower leg: No edema.     Left lower leg: No edema.  Skin:    General: Skin is warm and dry.     Findings: No rash.  Neurological:     GCS: GCS eye subscore is 2. GCS verbal subscore is 1. GCS motor subscore is 5.     Cranial Nerves: No cranial nerve deficit (no facial droop ).     Motor: No abnormal muscle tone or seizure activity.     Comments: Patient does open eyes and grimace in response to painful stimuli, does not answer my questions, resists me opening her mouth to try to examine her oropharynx     ED Results / Procedures / Treatments   Labs (all labs ordered are listed, but only abnormal results are displayed) Labs Reviewed  CBG MONITORING, ED - Abnormal; Notable for the following components:      Result Value   Glucose-Capillary 437 (*)    All other components within normal limits  I-STAT BETA HCG BLOOD, ED ([facility], [facility], [facility] ONLY) - Abnormal; Notable for the following components:   I-stat hCG, quantitative 6.6 (*)    All other components within normal limits  I-STAT CHEM 8, ED - Abnormal; Notable for the  following components:   Chloride 91 (*)    BUN 50 (*)    Creatinine, Ser 10.40 (*)    Glucose, Bld 564 (*)    Calcium, Ion 0.97 (*)    All other components within normal limits  I-STAT VENOUS BLOOD GAS, ED - Abnormal; Notable for the following components:   pO2, Ven 60.0 (*)    Calcium, Ion 0.97 (*)    All other components within normal limits  CBC WITH DIFFERENTIAL/PLATELET  COMPREHENSIVE METABOLIC PANEL  ETHANOL  PROTIME-INR  APTT  BLOOD GAS, VENOUS  LIPASE, BLOOD  MAGNESIUM  OSMOLALITY  I-STAT BETA HCG BLOOD, ED ([facility], [facility], [facility] ONLY)  I-STAT ARTERIAL BLOOD GAS, ED  I-STAT CHEM 8, ED  SAMPLE TO BLOOD BANK  TROPONIN I (HIGH SENSITIVITY)    EKG EKG Interpretation  Date/Time:  Tuesday February 12 2020 08:42:03 EST Ventricular Rate:  123 PR Interval:    QRS Duration: 86 QT Interval:  351 QTC Calculation: 503 R Axis:   99 Text Interpretation: Sinus or ectopic atrial tachycardia Borderline right axis deviation Borderline prolonged QT interval t waves decreased since prior tracing Confirmed by Dorie Rank [phone]) on 02/12/2020 8:45:16 AM   EKG Interpretation  Date/Time:  Tuesday February 12 2020 10:09:00 EST Ventricular Rate:  136 PR Interval:    QRS Duration: 83 QT Interval:  264 QTC Calculation: 397 R Axis:  74 Text Interpretation: Sinus tachycardia Multiform ventricular premature complexes Probable left atrial enlargement Probable LVH with secondary repol abnrm Baseline wander in lead(s) V4 V5 No significant change since last tracing Confirmed by Dorie Rank [phone]) on 02/12/2020 11:00:18 AM          Radiology CT HEAD WO CONTRAST  Result Date: 02/12/2020 CLINICAL DATA:  Seizure, nontraumatic (Age 16-40y) EXAM: CT HEAD WITHOUT CONTRAST TECHNIQUE: Contiguous axial images were obtained from the base of the skull through the vertex without intravenous contrast. COMPARISON:  05/01/2018 head CT and prior. FINDINGS: Brain: No acute infarct or intracranial hemorrhage. No  mass lesion. No midline shift, ventriculomegaly or extra-axial fluid collection. Vascular: No hyperdense vessel or unexpected calcification. Skull: Negative for fracture or focal lesion. Sinuses/Orbits: Normal orbits. Clear paranasal sinuses. No mastoid effusion. Other: None. IMPRESSION: No acute intracranial process. Electronically Signed   By: Primitivo Gauze M.D.   On: 02/12/2020 10:12    Procedures .Critical Care Performed by: Dorie Rank, MD Authorized by: Dorie Rank, MD   Critical care provider statement:    Critical care time (minutes):  45   Critical care was time spent personally by me on the following activities:  Discussions with consultants, evaluation of patient's response to treatment, examination of patient, ordering and performing treatments and interventions, ordering and review of laboratory studies, ordering and review of radiographic studies, pulse oximetry, re-evaluation of patient's condition, obtaining history from patient or surrogate and review of old charts   (including critical care time)  Medications Ordered in ED Medications  LORazepam (ATIVAN) 2 MG/ML injection (has no administration in time range)  amiodarone (NEXTERONE) 1.8 mg/mL load via infusion 150 mg (150 mg Intravenous Bolus from Bag 02/12/20 1013)    Followed by  amiodarone (NEXTERONE PREMIX) 360-4.14 MG/200ML-% (1.8 mg/mL) IV infusion (has no administration in time range)    Followed by  amiodarone (NEXTERONE PREMIX) 360-4.14 MG/200ML-% (1.8 mg/mL) IV infusion (has no administration in time range)  lidocaine (cardiac) 100 mg/68mL (XYLOCAINE) injection 2% (100 mg Intravenous Given 02/12/20 1028)  insulin aspart (novoLOG) injection 10 Units (has no administration in time range)  adenosine (ADENOCARD) 6 MG/2ML injection (  Given 02/12/20 1007)  lidocaine (PF) (XYLOCAINE) 1 % injection (3 mLs Subcutaneous Given by Other 02/12/20 1037)  morphine 4 MG/ML injection 4 mg (4 mg Intravenous Given 02/12/20  1112)  labetalol (NORMODYNE) injection 20 mg (20 mg Intravenous Given 02/12/20 1113)    ED Course  I have reviewed the triage vital signs and the nursing notes.  Pertinent labs & imaging results that were available during my care of the patient were reviewed by me and considered in my medical decision making (see chart for details).  Clinical Course as of Feb 14 739  Tue Feb 12, 2020  0947 Pt awake now.  Crying, asking for help.  Sitting up in bed.  Will not answer if anything is hurting her.  Will ask for head CT to be performed stat.  Staff unable to obtain blood.   [TG]  6269 Pt had seizure like episode.  Noted to be in tachycardic rhythm earlier, rate 240.  Pt had her head CT , immediately brought back to bedside.  IO placed.  No response to adenosine.  Synchronized cardioversion at 150 joules.  Back in sinus rhythm.  Femoral line placed due to lack of venous access   [JK]  1105 I/O removed.  We will give a dose of morphine for pain.  Patient is now awake    [  JK]  1107 Blood sugar noted to be elevated at 564.   [JK]  8592 ABG attempted by RT. Unsuccessful   [JK]  55 [facility] critical care will come and see patient in the ED   [JK]  1200 Spoke with [facility] and Dr. Marval Regal, nephrology   [JK]    Clinical Course User Index [JK] Dorie Rank, MD   MDM Rules/Calculators/A&P                         Patient presented to the ED with presentation concerning for possible acute seizure.  Patient was altered.  Patient has multiple medical problems including chronic kidney disease.  Patient noted to be hypertensive.  Mental status has not returned to baseline.  While in the ED the patient did have another episode of what appeared to be a seizure.  When I arrived at the bedside the patient was more sonorous.  Bedside monitor showed she is tachycardic with a rate approximately 240.  Patient was immediately brought to the bedside.  Pacer pads were placed.  As that was being done  patient did get 6 of adenosine.  No response to that so patient was cardioverted successfully with 150 J.  Infusion ordered.  Patient is back in a sinus rhythm.  Mental status still has not returned to baseline.  CT scan does not show any evidence of hemorrhage.  Patient is awake alert but not communicating properly.  Patient is hyperglycemic but doubt this is high enough to cause hyper glycemic hyperosmolar syndrome.  No evidence of acidosis on her pH.  Will consult with intensivist for admission.  She will also likely need cardiac, nephrology and neurology consultation.    Final Clinical Impression(s) / ED Diagnoses Final diagnoses:  Wide-complex tachycardia ([place])  Seizure ([place])  Altered mental status, unspecified altered mental status type  Hyperglycemia      Dorie Rank, MD 02/12/20 1119 Ed course update   Dorie Rank, MD 02/14/20 [phone]

## 2020-02-12 NOTE — Consult Note (Addendum)
Cardiology Consultation:   Patient ID: [name] MRN: [id number]; DOB: 1985/03/18  Admit date: 02/12/2020 Date of Consult: 02/12/2020  Primary Care Provider: [name], MD [facility] Cardiologist: Jenkins Rouge, MD    Patient Profile:   [name] is a 34 y.o. female with a hx of ESRD on HD MWF, s/p failed renal transplant, HTN, HLD, DM type 1, GERD, XXX syndrome, anemia of chronic disease who is being seen today for the evaluation of tachycardia at the request of Gwyndolyn Saxon Minor, NP.   Seen by Dr. Johnsie Cancel when admitted for chest pain 01/2019. Echo with LVEF of 60-65% with normal WM. Follow up outpatient stress test 02/2019 was low risk.   History of Present Illness:   [name] brought by EMS for witnessed seizure for 15 seconds this morning. EMS noted pt in posttictal. Pt had seizure while transported to CT and become hypertensive and tachycardic with SVT at rate of 220-240s. She did not responded to 6mg  of adenosine and then had successful cardioversion at 150 jules. Started amiodarone drip with bolus.  Maintaining sinus rhythm. BP improving after IV antihypertensive, last reading 147/112. Had dialysis yesterday.   CT of head without acute issue.  K 5.1 Glucose 560 Scr 10.4 Hs-troponin 23 Respiratory panel negative for COVID and influenza  Patient just moved to floor. No family at bedside. No answering any questions. Lethargic.   Past Medical History:  Diagnosis Date  . Anemia   . Chronic kidney disease    kidney transplant 07  . Diabetes mellitus    Pt reports diagnosis in June 2011, Type 2  . GERD (gastroesophageal reflux disease)   . Hyperlipidemia   . Hypertension   . 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 Pyelonephritis 06/23/2014  . Seasonal allergies   . UTI (urinary tract infection) 01/09/2015  . XXX SYNDROME  11/19/2008   Qualifier: Diagnosis of  By: Carlena Sax  MD, Colletta Maryland      Past Surgical History:  Procedure Laterality Date  . ARTERIOVENOUS GRAFT PLACEMENT Bilateral    "neither work" (10/24/2017)  . AV FISTULA PLACEMENT Left 10/26/2018   Procedure: CREATION OF ARTERIOVENOUS FISTULA  LEFT ARM;  Surgeon: [name], MD;  Location: [place];  Service: Vascular;  Laterality: Left;  . BASCILIC VEIN TRANSPOSITION Left 12/21/2018   Procedure: Left arm BASILIC VEIN TRANSPOSITION SECOND STAGE;  Surgeon: [name], MD;  Location: [place];  Service: Vascular;  Laterality: Left;  . CHOLECYSTECTOMY N/A 06/30/2017   Procedure: LAPAROSCOPIC CHOLECYSTECTOMY WITH INTRAOPERATIVE CHOLANGIOGRAM;  Surgeon: Excell Seltzer, MD;  Location: [facility];  Service: General;  Laterality: N/A;  . ESOPHAGOGASTRODUODENOSCOPY (EGD) WITH PROPOFOL N/A 07/04/2017   Procedure: ESOPHAGOGASTRODUODENOSCOPY (EGD) WITH PROPOFOL;  Surgeon: Clarene Essex, MD;  Location: [facility];  Service: Endoscopy;  Laterality: N/A;  . INSERTION OF DIALYSIS CATHETER N/A 03/20/2018   Procedure: INSERTION OF TUNNELED DIALYSIS CATHETER - RIGHT INTERANL JUGULAR PLACEMENT;  Surgeon: Angelia Mould, MD;  Location: [place];  Service: Vascular;  Laterality: N/A;  . IR GUIDED [place]  10/28/2018  . KIDNEY TRANSPLANT  2007  . PARATHYROIDECTOMY  ?2012   "3/4 removed" (10/24/2017)  . RENAL BIOPSY Bilateral 2003  . UPPER EXTREMITY VENOGRAPHY Bilateral 10/19/2018   Procedure: UPPER EXTREMITY VENOGRAPHY;  Surgeon: [name], MD;  Location: [place] [facility];  Service:  Cardiovascular;  Laterality: Bilateral;  Bilateral     Inpatient Medications: Scheduled Meds: . heparin  5,000 Units Subcutaneous Q8H  . pantoprazole (PROTONIX) IV  40 mg Intravenous QHS  . sodium chloride flush  10-40 mL Intracatheter Q12H   Continuous Infusions: . amiodarone 60 mg/hr (02/12/20 1120)   Followed by  . amiodarone    . dextrose 5%  lactated ringers    . insulin 11 Units/hr (02/12/20 1325)  . lactated ringers 125 mL/hr at 02/12/20 1330   PRN Meds: dextrose, docusate sodium, polyethylene glycol, sodium chloride flush  Allergies:    Allergies  Allergen Reactions  . Benadryl [Diphenhydramine-Zinc Acetate] Anaphylaxis    Stopped breathing in ICU  . Doxycycline Shortness Of Breath  . Motrin [Ibuprofen] Shortness Of Breath and Itching    Per pt  . Banana Other (See Comments)    Sick on the stomach  . Iron Dextran Other (See Comments)    REACTION: vein irritation  . Shellfish Allergy Hives  . Chlorhexidine Itching    Social History:   Social History   Socioeconomic History  . Marital status: Single    Spouse name: Not on file  . Number of children: Not on file  . Years of education: Not on file  . Highest education level: Not on file  Occupational History  . 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 Smoking status: Never Smoker  . Smokeless tobacco: Never Used  Vaping Use  . Vaping Use: Never used  Substance and Sexual Activity  . Alcohol use: No  . Drug use: No  . Sexual activity: Yes    Birth control/protection: None  Other Topics Concern  . Not on file  Social History Narrative   Patient reports that she is single, she is employed at the [employer]   No alcohol tobacco or drug use   Social Determinants of Radio broadcast assistant Strain:   . Difficulty of Paying Living Expenses: Not on file  Food Insecurity:   . Worried About Charity fundraiser in the Last Year: Not on file  . Ran Out of Food in the Last Year: Not on file  Transportation Needs:   . Lack of Transportation (Medical): Not on file  . Lack of Transportation (Non-Medical): Not on file  Physical Activity:   . Days of Exercise per Week: Not on file  . Minutes of Exercise per Session: Not on file  Stress:   . Feeling of Stress : Not on file  Social Connections:   .  Frequency of Communication with Friends and Family: Not on file  . Frequency of Social Gatherings with Friends and Family: Not on file  . Attends Religious Services: Not on file  . Active Member of Clubs or Organizations: Not on file  . Attends Archivist Meetings: Not on file  . Marital Status: Not on file  Intimate Partner Violence:   . Fear of Current or Ex-Partner: Not on file  . Emotionally Abused: Not on file  . Physically Abused: Not on file  . Sexually Abused: Not on file    Family History:   Family History  Problem Relation Age of Onset  . Arthritis Mother   . Hypertension Mother   . Aneurysm Mother        died of brain aneurysm  . CAD Father        Has 3 stents  . Diabetes  Father        borderline  . Early death Brother        Died in war     ROS:  Please see the history of present illness.  All other ROS reviewed and negative.     Physical Exam/Data:   Vitals:   02/12/20 1245 02/12/20 1300 02/12/20 1315 02/12/20 1330  BP: (!) 153/113 (!) 168/116 (!) 157/116 (!) 141/112  Pulse:   95 98  Resp: (!) 26 16 (!) 27 (!) 37  Temp:      TempSrc:      SpO2:   98% 98%  Weight:      Height:       No intake or output data in the 24 hours ending 02/12/20 1409 Last 3 Weights 02/12/2020 12/11/2019 10/15/2019  Weight (lbs) 188 lb 4.4 oz 188 lb 4.8 oz 175 lb 11.3 oz  Weight (kg) 85.4 kg 85.412 kg 79.7 kg     Body mass index is 30.39 kg/m.  General:  Ill appearing female in no acute distress HEENT: normal Lymph: no adenopathy Neck: no JVD Endocrine:  No thryomegaly Vascular: No carotid bruits; FA pulses 2+ bilaterally without bruits  Cardiac:  normal S1, S2; RRR; no murmur  Lungs:  clear to auscultation bilaterally, no wheezing, rhonchi or rales  Abd: soft, nontender, no hepatomegaly  Ext: no edema Musculoskeletal:  No deformities, BUE and BLE strength normal and equal Skin: warm and dry  Neuro:  no focal abnormalities noted Psych:  Lethergic   EKG:   The EKG was personally reviewed and demonstrates:  Sinus tachycardia 123 bpm, PVCs, LVH Telemetry:  Telemetry was personally reviewed and demonstrates:  Sinus tachycardia  Relevant CV Studies: Echo 01/2019 1. Left ventricular ejection fraction, by visual estimation, is 60 to  65%. The left ventricle has normal function. There is no left ventricular  hypertrophy.  2. Global right ventricle has normal systolic function.The right  ventricular size is normal. No increase in right ventricular wall  thickness.  3. Left atrial size was normal.  4. Right atrial size was normal.  5. The mitral valve is normal in structure. Trace mitral valve  regurgitation. No evidence of mitral stenosis.  6. The tricuspid valve is normal in structure. Tricuspid valve  regurgitation is mild.  7. The aortic valve is grossly normal. Aortic valve regurgitation is  trivial. No evidence of aortic valve sclerosis or stenosis.  8. There is Mild focal calcification of the aortic valve adjacent to the  noncoronary-left coronary commissure.  9. The pulmonic valve was normal in structure. Pulmonic valve  regurgitation is trivial.  10. Normal pulmonary artery systolic pressure.  11. The inferior vena cava is normal in size with greater than 50%  respiratory variability, suggesting right atrial pressure of 3 mmHg.   Stress test 02/2019  Nuclear stress EF: 61%.  There was no ST segment deviation noted during stress.  This is a low risk study.  The left ventricular ejection fraction is normal (55-65%).   Small fixed perfusion defect in the inferoapex may represent diaphragmatic attenuation.    Laboratory Data:  High Sensitivity Troponin:   Recent Labs  Lab 02/12/20 1137  TROPONINIHS 23*     Chemistry Recent Labs  Lab 02/12/20 1049 02/12/20 1059 02/12/20 1101  NA 137 135 135  K 5.1 5.0 5.1  CL 86* 91*  --   CO2 22  --   --   GLUCOSE 560* 564*  --   BUN 52* 50*  --  CREATININE 10.34*  10.40*  --   CALCIUM 9.3  --   --   GFRNONAA 5*  --   --   ANIONGAP 29*  --   --     Recent Labs  Lab 02/12/20 1049  PROT 8.9*  ALBUMIN 4.1  AST 35  ALT 27  ALKPHOS 65  BILITOT 0.9   Hematology Recent Labs  Lab 02/12/20 1059 02/12/20 1101 02/12/20 1148  WBC  --   --  7.1  RBC  --   --  4.03  HGB 13.9 14.3 12.2  HCT 41.0 42.0 39.0  MCV  --   --  96.8  MCH  --   --  30.3  MCHC  --   --  31.3  RDW  --   --  15.4  PLT  --   --  167   BNPNo results for input(s): BNP, PROBNP in the last 168 hours.  DDimer No results for input(s): DDIMER in the last 168 hours.   Radiology/Studies:  CT HEAD WO CONTRAST  Result Date: 02/12/2020 CLINICAL DATA:  Seizure, nontraumatic (Age 71-40y) EXAM: CT HEAD WITHOUT CONTRAST TECHNIQUE: Contiguous axial images were obtained from the base of the skull through the vertex without intravenous contrast. COMPARISON:  05/01/2018 head CT and prior. FINDINGS: Brain: No acute infarct or intracranial hemorrhage. No mass lesion. No midline shift, ventriculomegaly or extra-axial fluid collection. Vascular: No hyperdense vessel or unexpected calcification. Skull: Negative for fracture or focal lesion. Sinuses/Orbits: Normal orbits. Clear paranasal sinuses. No mastoid effusion. Other: None. IMPRESSION: No acute intracranial process. Electronically Signed   By: Primitivo Gauze M.D.   On: 02/12/2020 10:12     Assessment and Plan:   1. SVT - Patient had narrow complex tachycardia while in CT scanner. Did not responded to adenosine. Converted to sinus with synchronized cardioversion at 150J. Currently in sinus tachycardia in 100-110s on IV amiodarone. She does have underlying repolarization abnormality on prior EKG. Some ectopy on most recent EKG. ? Underlying wide complex tachycardia.  - Also ? Seizure like activity when pt was tachycardic. No tachycardic rhythm strip available for review. ER note has one strip. Reviewed rhythm on telemetry.  - No Evidence of  structural heart abnormality on prior echo  - Repeat echo  -  Metoprolol tartrate 25mg  BID and Coreg 12.5mg  BID listed on home meds>> needs review   2. AMS and Seizure - She had witness seizure at home and in ER  - Prior hx of pseudoseizures - CT of acute without acute process - pending neuro eval  3. ESRD on HD and failed renal transplant - pending nephrology eval  4. HTN - BP improving   For questions or updates, please contact [place] Please consult [email or web address] for contact info under    Jarrett Soho, [place]  02/12/2020 2:09 PM   Patient seen and examined with [facility].  Agree as above, with the following exceptions and changes as noted below.  Ms. Rookstool is a 34 year old female with a history of solitary kidney and renal failure in childhood requiring dialysis, now with failed renal transplant who presents with seizure-like activity at home, and wide-complex tachycardia requiring cardioversion while in the CT scanner in the ED.  She has no known structural heart disease.  I have interpreted her echocardiogram in November 2020 and have reviewed this echocardiogram again today.  No definite structural heart disease or congenital heart disease noted.  There are no twelve-lead EKGs of her tachycardic episode.  Rate was approximately 240 and this was captured on telemetry.  Unclear if this represents an aberrant SVT versus ventricular tachycardia.  She takes beta-blockade at home.  She is unable to give a history today but is able to say yes.  She denies pain, I have asked several times.  Gen: Appears postictal, CV: Tachycardic and regular, no murmurs, Lungs: clear, Abd: soft, Extrem: Warm, well perfused, no edema, Neuro/Psych: altered. All available labs, radiology testing, previous records reviewed.   Patient has been on amiodarone post cardioversion and has maintained sinus tachycardia.  I have briefly reviewed her case and telemetry with electrophysiology.   Given age and lack of structural heart disease, we may be able to continue her on beta-blockade alone and may be able to stop amiodarone.  We have also discussed possible use of verapamil in the event this is verapamil sensitive VT.  Verapamil may have more drug drug interactions, and therefore we will start with beta-blockade and monitor her response.  Recommendations: - ok to stop amiodarone at this time.  - resume metoprolol tartrate 25 mg BID - repeat echocardiogram.  - if repeat episode of WCT, please obtain a 12 lead ECG to capture.  We will follow along.   Elouise Munroe, MD 02/12/20 5:15 PM  CRITICAL CARE Performed by: Cherlynn Kaiser, MD   Total critical care time: 35 minutes   Critical care time was exclusive of separately billable procedures and treating other patients.   Critical care was necessary to treat or prevent imminent or life-threatening deterioration.   Critical care was time spent personally by me (independent of APPs or residents) on the following activities: development of treatment plan with patient and/or surrogate as well as nursing, discussions with consultants, evaluation of patient's response to treatment, examination of patient, obtaining history from patient or surrogate, ordering and performing treatments and interventions, ordering and review of laboratory studies, ordering and review of radiographic studies, pulse oximetry and re-evaluation of patient's condition.

## 2020-02-12 NOTE — ED Notes (Signed)
Ct called and stated pt was having seizure. Went to

## 2020-02-13 ENCOUNTER — Inpatient Hospital Stay (HOSPITAL_COMMUNITY): Payer: Medicare Other

## 2020-02-13 DIAGNOSIS — R000 Tachycardia, unspecified: Secondary | ICD-10-CM

## 2020-02-13 DIAGNOSIS — I472 Ventricular tachycardia: Secondary | ICD-10-CM

## 2020-02-13 LAB — CBC
HCT: 32.7 % — ABNORMAL LOW (ref 36.0–46.0)
Hemoglobin: 10.4 g/dL — ABNORMAL LOW (ref 12.0–15.0)
MCH: 31 pg (ref 26.0–34.0)
MCHC: 31.8 g/dL (ref 30.0–36.0)
MCV: 97.6 fL (ref 80.0–100.0)
Platelets: 145 10*3/uL — ABNORMAL LOW (ref 150–400)
RBC: 3.35 MIL/uL — ABNORMAL LOW (ref 3.87–5.11)
RDW: 15.7 % — ABNORMAL HIGH (ref 11.5–15.5)
WBC: 9.8 10*3/uL (ref 4.0–10.5)
nRBC: 0 % (ref 0.0–0.2)

## 2020-02-13 LAB — BASIC METABOLIC PANEL
Anion gap: 19 — ABNORMAL HIGH (ref 5–15)
Anion gap: 22 — ABNORMAL HIGH (ref 5–15)
BUN: 65 mg/dL — ABNORMAL HIGH (ref 6–20)
BUN: 65 mg/dL — ABNORMAL HIGH (ref 6–20)
CO2: 31 mmol/L (ref 22–32)
CO2: 29 mmol/L (ref 22–32)
Calcium: 8.6 mg/dL — ABNORMAL LOW (ref 8.9–10.3)
Calcium: 8.5 mg/dL — ABNORMAL LOW (ref 8.9–10.3)
Chloride: 92 mmol/L — ABNORMAL LOW (ref 98–111)
Chloride: 91 mmol/L — ABNORMAL LOW (ref 98–111)
Creatinine, Ser: 10.44 mg/dL — ABNORMAL HIGH (ref 0.44–1.00)
Creatinine, Ser: 10.81 mg/dL — ABNORMAL HIGH (ref 0.44–1.00)
GFR, Estimated: 4 mL/min — ABNORMAL LOW (ref >60)
GFR, Estimated: 4 mL/min — ABNORMAL LOW (ref >60)
Glucose, Bld: 152 mg/dL — ABNORMAL HIGH (ref 70–99)
Glucose, Bld: 197 mg/dL — ABNORMAL HIGH (ref 70–99)
Potassium: 4.6 mmol/L (ref 3.5–5.1)
Potassium: 4.9 mmol/L (ref 3.5–5.1)
Sodium: 142 mmol/L (ref 135–145)
Sodium: 142 mmol/L (ref 135–145)

## 2020-02-13 LAB — GLUCOSE, CAPILLARY
Glucose-Capillary: 122 mg/dL — ABNORMAL HIGH (ref 70–99)
Glucose-Capillary: 147 mg/dL — ABNORMAL HIGH (ref 70–99)
Glucose-Capillary: 156 mg/dL — ABNORMAL HIGH (ref 70–99)
Glucose-Capillary: 163 mg/dL — ABNORMAL HIGH (ref 70–99)
Glucose-Capillary: 142 mg/dL — ABNORMAL HIGH (ref 70–99)
Glucose-Capillary: 102 mg/dL — ABNORMAL HIGH (ref 70–99)
Glucose-Capillary: 89 mg/dL (ref 70–99)

## 2020-02-13 LAB — POCT I-STAT 7, (LYTES, BLD GAS, ICA,H+H)
Acid-Base Excess: 7 mmol/L — ABNORMAL HIGH (ref 0.0–2.0)
Bicarbonate: 34 mmol/L — ABNORMAL HIGH (ref 20.0–28.0)
Calcium, Ion: 1.03 mmol/L — ABNORMAL LOW (ref 1.15–1.40)
HCT: 31 % — ABNORMAL LOW (ref 36.0–46.0)
Hemoglobin: 10.5 g/dL — ABNORMAL LOW (ref 12.0–15.0)
O2 Saturation: 92 %
Patient temperature: 98.6
Potassium: 4.8 mmol/L (ref 3.5–5.1)
Sodium: 139 mmol/L (ref 135–145)
TCO2: 36 mmol/L — ABNORMAL HIGH (ref 22–32)
pCO2 arterial: 58.4 mmHg — ABNORMAL HIGH (ref 32.0–48.0)
pH, Arterial: 7.373 (ref 7.350–7.450)
pO2, Arterial: 69 mmHg — ABNORMAL LOW (ref 83.0–108.0)

## 2020-02-13 LAB — TSH: TSH: 3.331 u[IU]/mL (ref 0.350–4.500)

## 2020-02-13 LAB — BETA-HYDROXYBUTYRIC ACID: Beta-Hydroxybutyric Acid: 0.59 mmol/L — ABNORMAL HIGH (ref 0.05–0.27)

## 2020-02-13 LAB — MAGNESIUM: Magnesium: 2.6 mg/dL — ABNORMAL HIGH (ref 1.7–2.4)

## 2020-02-13 LAB — PHOSPHORUS: Phosphorus: 7 mg/dL — ABNORMAL HIGH (ref 2.5–4.6)

## 2020-02-13 MED ORDER — PANTOPRAZOLE SODIUM 40 MG PO TBEC
40.0000 mg | DELAYED_RELEASE_TABLET | Freq: Every day | ORAL | Status: DC
  Administered 2020-02-13 – 2020-02-14 (×2): 40 mg via ORAL
  Filled 2020-02-13 (×2): qty 1

## 2020-02-13 MED ORDER — CALCIUM ACETATE (PHOS BINDER) 667 MG PO CAPS
1334.0000 mg | ORAL_CAPSULE | Freq: Three times a day (TID) | ORAL | Status: DC
  Administered 2020-02-14 – 2020-02-15 (×3): 1334 mg via ORAL
  Filled 2020-02-13 (×7): qty 2

## 2020-02-13 MED ORDER — METOPROLOL TARTRATE 25 MG PO TABS
25.0000 mg | ORAL_TABLET | Freq: Two times a day (BID) | ORAL | Status: DC
  Administered 2020-02-13 – 2020-02-15 (×5): 25 mg via ORAL
  Filled 2020-02-13: qty 1
  Filled 2020-02-13 (×4): qty 2

## 2020-02-13 MED ORDER — HYDROXYZINE HCL 25 MG PO TABS
25.0000 mg | ORAL_TABLET | Freq: Four times a day (QID) | ORAL | Status: DC | PRN
  Administered 2020-02-13: 25 mg via ORAL
  Filled 2020-02-13: qty 1

## 2020-02-13 NOTE — Progress Notes (Addendum)
Neurology Progress Note   S:// Seen and examined Awake alert following all commands appropriately   O:// Current vital signs: BP 114/77   Pulse 91   Temp 98.1 F (36.7 C) (Oral)   Resp 13   Ht 5\' 6"  (1.676 m)   Wt 87.5 kg   SpO2 97%   BMI 31.14 kg/m  Vital signs in last 24 hours: Temp:  [97 F (36.1 C)-98.6 F (37 C)] 98.1 F (36.7 C) (12/01 0859) Pulse Rate:  [90-136] 91 (12/01 0700) Resp:  [12-38] 13 (12/01 0700) BP: (112-193)/(77-143) 114/77 (12/01 0700) SpO2:  [77 %-100 %] 97 % (12/01 0700) FiO2 (%):  [36 %] 36 % (11/30 1256) Weight:  [87.5 kg] 87.5 kg (12/01 0430) General: Awake alert in no distress HEENT: Normocephalic atraumatic Lungs: Clear Cardiovascular: Regular rate rhythm Extremities warm well perfused Abdomen nondistended nontender Neurological examination awake alert oriented to self and the fact that she is in the hospital. She did asked me what day of the week it was and I told her that it was Wednesday and she said "wow I went to dialysis a day ago". She was able to tell me the month and the year. She was not able to name the current president Her attention concentration was mildly reduced and she seemed a little distractible. No dysarthria No aphasia Clearance: Pupils equal round react light, extraocular wounds intact, visual field full, face symmetric, facial sensation intact, auditory daily intact, tongue and palate midline. Motor exam: 5/5 in all fours Sensory exam: Intact light touch without extinction Coordination intact bilaterally  Medications  Current Facility-Administered Medications:  .  [COMPLETED] amiodarone (NEXTERONE) 1.8 mg/mL load via infusion 150 mg, 150 mg, Intravenous, Once, 150 mg at 02/12/20 1013 **FOLLOWED BY** [EXPIRED] amiodarone (NEXTERONE PREMIX) 360-4.14 MG/200ML-% (1.8 mg/mL) IV infusion, 60 mg/hr, Intravenous, Continuous, Stopped at 02/12/20 1705 **FOLLOWED BY** amiodarone (NEXTERONE PREMIX) 360-4.14 MG/200ML-% (1.8  mg/mL) IV infusion, 30 mg/hr, Intravenous, Continuous, Dorie Rank, MD, Last Rate: 16.67 mL/hr at 02/13/20 0950, 30 mg/hr at 02/13/20 0950 .  dextrose 5 % in lactated ringers infusion, , Intravenous, Continuous, Omar Person, NP, Last Rate: 75 mL/hr at 02/13/20 0629, New Bag at 02/13/20 0629 .  dextrose 50 % solution 0-50 mL, 0-50 mL, Intravenous, PRN, Minor, Grace Bushy, NP .  docusate sodium (COLACE) capsule 100 mg, 100 mg, Oral, BID PRN, Minor, Grace Bushy, NP .  heparin injection 5,000 Units, 5,000 Units, Subcutaneous, Q8H, Minor, Grace Bushy, NP, 5,000 Units at 02/13/20 0544 .  insulin aspart (novoLOG) injection 1-3 Units, 1-3 Units, Subcutaneous, Q4H, Anders Simmonds, MD, 2 Units at 02/13/20 [id number] .  insulin detemir (LEVEMIR) injection 8 Units, 8 Units, Subcutaneous, Q12H, Anders Simmonds, MD, 8 Units at 02/13/20 0024 .  lactated ringers infusion, , Intravenous, Continuous, Omar Person, NP, Last Rate: 75 mL/hr at 02/12/20 1500, Rate Verify at 02/12/20 1500 .  pantoprazole (PROTONIX) injection 40 mg, 40 mg, Intravenous, QHS, Minor, Grace Bushy, NP, 40 mg at 02/12/20 2136 .  polyethylene glycol (MIRALAX / GLYCOLAX) packet 17 g, 17 g, Oral, Daily PRN, Minor, Grace Bushy, NP .  sodium chloride flush (NS) 0.9 % injection 10-40 mL, 10-40 mL, Intracatheter, Q12H, Dorie Rank, MD .  sodium chloride flush (NS) 0.9 % injection 10-40 mL, 10-40 mL, Intracatheter, PRN, Dorie Rank, MD Labs CBC    Component Value Date/Time   WBC 9.8 02/13/2020 0442   RBC 3.35 (L) 02/13/2020 0442   HGB 10.4 (L) 02/13/2020 9798  HCT 32.7 (L) 02/13/2020 0442   PLT 145 (L) 02/13/2020 0442   PLT 206 07/17/2009 0000   MCV 97.6 02/13/2020 0442   MCH 31.0 02/13/2020 0442   MCHC 31.8 02/13/2020 0442   RDW 15.7 (H) 02/13/2020 0442   LYMPHSABS 0.5 (L) 02/12/2020 1357   MONOABS 0.1 02/12/2020 1357   EOSABS 0.0 02/12/2020 1357   BASOSABS 0.0 02/12/2020 1357    CMP     Component Value Date/Time   NA 142 02/13/2020  0442   K 4.9 02/13/2020 0442   CL 91 (L) 02/13/2020 0442   CO2 29 02/13/2020 0442   GLUCOSE 197 (H) 02/13/2020 0442   BUN 65 (H) 02/13/2020 0442   CREATININE 10.81 (H) 02/13/2020 0442   CALCIUM 8.5 (L) 02/13/2020 0442   CALCIUM 5.7 (LL) 03/20/2018 1915   PROT 8.6 (H) 02/12/2020 1357   ALBUMIN 4.0 02/12/2020 1357   AST 27 02/12/2020 1357   ALT 22 02/12/2020 1357   ALKPHOS 57 02/12/2020 1357   BILITOT 0.6 02/12/2020 1357   GFRNONAA 4 (L) 02/13/2020 0442   GFRAA 12 (L) 10/16/2019 0221    Imaging I have reviewed images in epic and the results pertinent to this consultation are: CTH unremarkable for acute process  Assessment: 34 year old woman with generalized tonic-clonic seizure activity given that she was tachycardic and had severe hypertension in the setting of DKA. Exam initially by my partner was nonfocal but she was very drowsy. Today she is awake alert, has some mild attention deficits but other than that appears nonfocal. Reports having had a seizure at some point many years ago with whole body shaking.  Not been on an antiepileptic I do suspect that she had a provoked seizure in the setting of metabolic derangements.  Recommendations: I would not start her on an antiepileptic unless the EEG or MRI shows any abnormality. Routine EEG MRI brain without contrast as she is a dialysis patient Correction of toxic metabolic derangements including renal dysfunction as well as DKA per the primary team as you are. Seizure precautions listed below in detail.  Plan discussed with Dr. Erin Fulling on the unit  -- Amie Portland, MD [facility] Pager: [phone] If 7pm to 7am, please call on call as listed on AMION.  SEIZURE PRECAUTIONS Per [facility] statutes, patients with seizures are not allowed to drive until they have been seizure-free for six months.   Use caution when using heavy equipment or power tools. Avoid working on ladders or at heights. Take showers  instead of baths. Ensure the water temperature is not too high on the home water heater. Do not go swimming alone. Do not lock yourself in a room alone (i.e. bathroom). When caring for infants or small children, sit down when holding, feeding, or changing them to minimize risk of injury to the child in the event you have a seizure. Maintain good sleep hygiene. Avoid alcohol.   If patient has another seizure, call 911 and bring them back to the ED if: A. The seizure lasts longer than 5 minutes.  B. The patient doesn't wake shortly after the seizure or has new problems such as difficulty seeing, speaking or moving following the seizure C. The patient was injured during the seizure D. The patient has a temperature over 102 F (39C) E. The patient vomited during the seizure and now is having trouble breathing

## 2020-02-13 NOTE — Procedures (Addendum)
Patient Name: [name]  MRN: [id number]  Epilepsy Attending: Lora Havens  Referring Physician/Provider: [name], NP Date: 02/13/2020 Duration: 26.02 mins  Patient history: 34 year old woman with generalized tonic-clonic seizure activity in the setting of DKA.  EEG to evaluate for seizures.  Level of alertness: Awake  AEDs during EEG study: None  Technical aspects: This EEG study was done with scalp electrodes positioned according to the 10-20 International system of electrode placement. Electrical activity was acquired at a sampling rate of 500Hz  and reviewed with a high frequency filter of 70Hz  and a low frequency filter of 1Hz . EEG data were recorded continuously and digitally stored.   Description: No clear posterior dominant rhythm was seen.  EEG showed continuous generalized polymorphic 5 to 6 Hz theta slowing as well as intermittent generalized high amplitude 2 to 3 Hz delta slowing.  Hyperventilation and photic stimulation were not performed.     ABNORMALITY -Continuous slow, generalized -Intermittent rhythmic delta slow, generalized  IMPRESSION: This study is suggestive of moderate diffuse encephalopathy, nonspecific etiology.  No seizures or epileptiform discharges were seen throughout the recording.  Aminata Buffalo Barbra Sarks

## 2020-02-13 NOTE — Procedures (Signed)
Echo attempted. Patient care in progress. Will attempt again later as time permits.

## 2020-02-13 NOTE — Consult Note (Addendum)
[facility] Renal Consultation Note    ndication for Consultation:  Management of ESRD/hemodialysis; anemia, hypertension/volume and secondary hyperparathyroidism PCP:  HPI: [name] is a 34 y.o. female with ESRD on hemodialysis MWF at [facility]. PMH significant for DM/HTN, solitary kidney, S/P failed kidney transplant, anemia of chronic disease, SHPT.   Per patient she was at home with boyfriend, developed nausea, vomiting, severe L temporal HA. Said she lay down to rest and this was the last thing she remembers. Patient had witnessed seizure activity at home. Was brought to ED, extremely hypertensive, very lethargic. She had SVT HR 220-required Cardioversion. Did not require intubation but was very lethargic. CT of head unremarkable. BS 550 CO2 29  with mildly elevated K+ otherwise labs unremarkable for ESRD patient. CXR without evidence of volume overload. She has been admitted per PCCM with consultation to neurology for seizures. Patient is currently awake, alert, oriented without complaints at present.   Past Medical History:  Diagnosis Date  . Anemia   . Chronic kidney disease    kidney transplant 07  . Diabetes mellitus    Pt reports diagnosis in June 2011, Type 2  . GERD (gastroesophageal reflux disease)   . Hyperlipidemia   . Hypertension   . 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 Pyelonephritis 06/23/2014  . Seasonal allergies   . UTI (urinary tract infection) 01/09/2015  . 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 ARTERIOVENOUS GRAFT PLACEMENT Bilateral    "neither work" (10/24/2017)  . AV FISTULA PLACEMENT Left 10/26/2018   Procedure: CREATION OF ARTERIOVENOUS FISTULA  LEFT ARM;  Surgeon: [name], MD;   Location: [place];  Service: Vascular;  Laterality: Left;  . BASCILIC VEIN TRANSPOSITION Left 12/21/2018   Procedure: Left arm BASILIC VEIN TRANSPOSITION SECOND STAGE;  Surgeon: [name], MD;  Location: [place];  Service: Vascular;  Laterality: Left;  . CHOLECYSTECTOMY N/A 06/30/2017   Procedure: LAPAROSCOPIC CHOLECYSTECTOMY WITH INTRAOPERATIVE CHOLANGIOGRAM;  Surgeon: Excell Seltzer, MD;  Location: [facility];  Service: General;  Laterality: N/A;  . ESOPHAGOGASTRODUODENOSCOPY (EGD) WITH PROPOFOL N/A 07/04/2017   Procedure: ESOPHAGOGASTRODUODENOSCOPY (EGD) WITH PROPOFOL;  Surgeon: Clarene Essex, MD;  Location: [facility];  Service: Endoscopy;  Laterality: N/A;  . INSERTION OF DIALYSIS CATHETER N/A 03/20/2018   Procedure: INSERTION OF TUNNELED DIALYSIS CATHETER - RIGHT INTERANL JUGULAR PLACEMENT;  Surgeon: Angelia Mould, MD;  Location: [place];  Service: Vascular;  Laterality: N/A;  . IR GUIDED [place]  10/28/2018  . KIDNEY TRANSPLANT  2007  . PARATHYROIDECTOMY  ?2012   "3/4 removed" (10/24/2017)  . RENAL BIOPSY Bilateral 2003  . UPPER EXTREMITY VENOGRAPHY Bilateral 10/19/2018   Procedure: UPPER EXTREMITY VENOGRAPHY;  Surgeon: [name], MD;  Location: [place] [facility];  Service: Cardiovascular;  Laterality: Bilateral;  Bilateral    Family History  Problem Relation Age of Onset  . Arthritis Mother   . Hypertension Mother   . Aneurysm Mother        died of brain aneurysm  . CAD Father        Has 3 stents  . Diabetes Father        borderline  . Early death Brother  Died in war   Social History:  reports that she has never smoked. She has never used smokeless tobacco. She reports that she does not drink alcohol and does not use drugs. Allergies  Allergen Reactions  . Benadryl [Diphenhydramine-Zinc Acetate] Anaphylaxis    Stopped breathing in ICU  . Diphenhydramine Anaphylaxis  . Doxycycline Shortness Of Breath    Other reaction(s): Other  (See Comments) Shortness of Breath  Shortness of Breath  Shortness of Breath   . Motrin [Ibuprofen] Shortness Of Breath and Itching    Per pt  . Shellfish-Derived Products     hives  . Shellfish Allergy Hives  . Banana Other (See Comments), Itching and Nausea And Vomiting    Sick on the stomach Other reaction(s): Other (See Comments) Sick on the stomach Sick on the stomach  . Chlorhexidine Itching  . Ferrous Sulfate Itching  . Iron Dextran Other (See Comments) and Itching    Vein irritation REACTION: vein irritation REACTION: vein irritation REACTION: vein irritation REACTION: vein irritation   Prior to Admission medications   Medication Sig Start Date End Date Taking? Authorizing Provider  acetaminophen (TYLENOL) 500 MG tablet Take 1,000 mg by mouth every 6 (six) hours as needed for headache (pain).    Yes [provider]  calcium acetate (PHOSLO) 667 MG capsule Take 667-1,334 mg by mouth See admin instructions. Take 2 capsules (1334 mg) by mouth up to 4 times daily with meals, take 1 capsule (667 mg) with snacks 11/24/18  Yes [provider]  Calcium Carbonate Antacid (CALCIUM CARBONATE, DOSED IN MG ELEMENTAL CALCIUM,) 1250 MG/5ML SUSP Take 5 mLs by mouth 3 (three) times daily.  11/17/18  Yes [provider]  carvedilol (COREG) 12.5 MG tablet Take 1 tablet (12.5 mg total) by mouth 2 (two) times daily with a meal. Patient taking differently: Take 12.5 mg by mouth 2 (two) times daily as needed (SBP >150).  10/12/19  Yes Little Ishikawa, MD  cetirizine (ZYRTEC) 10 MG tablet Take 10 mg by mouth daily as needed for allergies.   Yes [provider]  D 1000 25 MCG (1000 UT) capsule Take 1,000 Units by mouth every other day.  04/27/19  Yes [provider]  diclofenac Sodium (VOLTAREN) 1 % GEL Apply 2 g topically 4 (four) times daily.   Yes [provider]  fluticasone (FLONASE) 50 MCG/ACT nasal spray Place 2 sprays into both nostrils  daily as needed for allergies. 09/03/18  Yes [name], MD  gabapentin (NEURONTIN) 400 MG capsule Take 400 mg by mouth 3 (three) times daily. 04/29/19  Yes [provider]  hydrOXYzine (ATARAX/VISTARIL) 50 MG tablet Take 1 tablet (50 mg total) by mouth 2 (two) times daily as needed for itching. 02/06/18  Yes [name], Ripudeep K, MD  insulin aspart (NOVOLOG FLEXPEN) 100 UNIT/ML FlexPen Inject 2-10 Units into the skin 3 (three) times daily with meals. as needed for blood sugar management (sliding scale)   Yes [provider]  insulin degludec (TRESIBA) 100 UNIT/ML SOPN FlexTouch Pen Inject 0.6 mLs (60 Units total) into the skin 2 (two) times daily. Reports taking 24 units QAM Patient taking differently: Inject 20 Units into the skin daily before breakfast.  03/06/18  Yes Charlynne Cousins, MD  lidocaine-prilocaine (EMLA) cream Apply 1 application topically See admin instructions. Apply topically one hour prior to dialysis on Monday, Wednesday, Friday 09/24/19  Yes [provider]  loperamide (IMODIUM) 1 MG/5ML solution Take 2 mg by mouth 2 (two) times  daily as needed for diarrhea or loose stools.    Yes [provider]  metoCLOPramide (REGLAN) 10 MG tablet Take 1 tablet (10 mg total) by mouth 3 (three) times daily before meals. 06/22/19  Yes Dessa Phi, DO  metoprolol tartrate (LOPRESSOR) 25 MG tablet Take 25 mg by mouth daily.    Yes [provider]  pantoprazole (PROTONIX) 40 MG tablet Take 40 mg by mouth 2 (two) times daily.    Yes [provider]  promethazine (PHENERGAN) 12.5 MG suppository Place 12.5 mg rectally 5 (five) times daily as needed for nausea or vomiting.    Yes [provider]  promethazine (PHENERGAN) 25 MG tablet Take 25 mg by mouth every 6 (six) hours as needed for nausea or vomiting.  10/13/19  Yes [provider]  simvastatin (ZOCOR) 20 MG tablet Take 20 mg by mouth at bedtime.    Yes [provider]   ACCU-CHEK SOFTCLIX LANCETS lancets Use to check blood sugar 4 times per day. 12/29/15   Renato Shin, MD  dicyclomine (BENTYL) 20 MG tablet Take 1 tablet (20 mg total) by mouth 2 (two) times daily as needed (stomach issues.). Patient not taking: Reported on 02/13/2020 06/22/19   Dessa Phi, DO  Methoxy PEG-Epoetin Beta (MIRCERA IJ) Mircera 07/23/19 07/21/20  [provider]  amLODipine (NORVASC) 10 MG tablet Take 1 tablet (10 mg total) by mouth daily. 03/25/18 02/07/19  Thurnell Lose, MD   Current Facility-Administered Medications  Medication Dose Route Frequency Provider Last Rate Last Admin  . dextrose 50 % solution 0-50 mL  0-50 mL Intravenous PRN Minor, Grace Bushy, NP      . docusate sodium (COLACE) capsule 100 mg  100 mg Oral BID PRN Minor, Grace Bushy, NP      . heparin injection 5,000 Units  5,000 Units Subcutaneous Q8H Minor, Grace Bushy, NP   5,000 Units at 02/13/20 0544  . hydrOXYzine (ATARAX/VISTARIL) tablet 25 mg  25 mg Oral Q6H PRN Freddi Starr, MD      . insulin aspart (novoLOG) injection 1-3 Units  1-3 Units Subcutaneous Q4H Anders Simmonds, MD   1 Units at 02/13/20 1211  . insulin detemir (LEVEMIR) injection 8 Units  8 Units Subcutaneous Q12H Anders Simmonds, MD   8 Units at 02/13/20 1211  . metoprolol tartrate (LOPRESSOR) tablet 25 mg  25 mg Oral BID Freddi Starr, MD   25 mg at 02/13/20 1211  . pantoprazole (PROTONIX) EC tablet 40 mg  40 mg Oral QHS Freda Jackson B, MD      . polyethylene glycol (MIRALAX / GLYCOLAX) packet 17 g  17 g Oral Daily PRN Minor, Grace Bushy, NP      . sodium chloride flush (NS) 0.9 % injection 10-40 mL  10-40 mL Intracatheter Q12H Dorie Rank, MD   10 mL at 02/13/20 1212  . sodium chloride flush (NS) 0.9 % injection 10-40 mL  10-40 mL Intracatheter PRN Dorie Rank, MD       Labs: Basic Metabolic Panel: Recent Labs  Lab 02/12/20 1357 02/12/20 1711 02/12/20 2104 02/12/20 2104 02/13/20 0104 02/13/20 0423 02/13/20 0442  NA  139   < > 141   < > 142 139 142  K 4.8   < > 4.9   < > 4.6 4.8 4.9  CL 89*   < > 91*  --  92*  --  91*  CO2 27   < > 30  --  31  --  29  GLUCOSE 445*   < > 167*  --  152*  --  197*  BUN 58*   < > 64*  --  65*  --  65*  CREATININE 10.06*   < > 10.23*  --  10.44*  --  10.81*  CALCIUM 9.2   < > 8.8*  --  8.6*  --  8.5*  PHOS 6.2*  --   --   --   --   --  7.0*   < > = values in this interval not displayed.   Liver Function Tests: Recent Labs  Lab 02/12/20 1049 02/12/20 1357  AST 35 27  ALT 27 22  ALKPHOS 65 57  BILITOT 0.9 0.6  PROT 8.9* 8.6*  ALBUMIN 4.1 4.0   Recent Labs  Lab 02/12/20 1049 02/12/20 1357  LIPASE 51 46  AMYLASE  --  112*   No results for input(s): AMMONIA in the last 168 hours. CBC: Recent Labs  Lab 02/12/20 1148 02/12/20 1148 02/12/20 1357 02/13/20 0423 02/13/20 0442  WBC 7.1  --  7.3  --  9.8  NEUTROABS 6.6  --  6.7  --   --   HGB 12.2   < > 12.2 10.5* 10.4*  HCT 39.0   < > 38.9 31.0* 32.7*  MCV 96.8  --  97.0  --  97.6  PLT 167  --  173  --  145*   < > = values in this interval not displayed.   Cardiac Enzymes: No results for input(s): CKTOTAL, CKMB, CKMBINDEX, TROPONINI in the last 168 hours. CBG: Recent Labs  Lab 02/13/20 0129 02/13/20 0236 02/13/20 0340 02/13/20 0723 02/13/20 1145  GLUCAP 122* 147* 156* 163* 142*   Iron Studies: No results for input(s): IRON, TIBC, TRANSFERRIN, FERRITIN in the last 72 hours. Studies/Results: CT HEAD WO CONTRAST  Result Date: 02/12/2020 CLINICAL DATA:  Seizure, nontraumatic (Age 68-40y) EXAM: CT HEAD WITHOUT CONTRAST TECHNIQUE: Contiguous axial images were obtained from the base of the skull through the vertex without intravenous contrast. COMPARISON:  05/01/2018 head CT and prior. FINDINGS: Brain: No acute infarct or intracranial hemorrhage. No mass lesion. No midline shift, ventriculomegaly or extra-axial fluid collection. Vascular: No hyperdense vessel or unexpected calcification. Skull: Negative  for fracture or focal lesion. Sinuses/Orbits: Normal orbits. Clear paranasal sinuses. No mastoid effusion. Other: None. IMPRESSION: No acute intracranial process. Electronically Signed   By: Primitivo Gauze M.D.   On: 02/12/2020 10:12   DG CHEST PORT 1 VIEW  Result Date: 02/12/2020 CLINICAL DATA:  Tachycardia EXAM: PORTABLE CHEST 1 VIEW COMPARISON:  Radiograph 10/14/2019 FINDINGS: Streaky linear areas of likely subsegmental atelectasis with additional hazy basilar opacities likely reflecting further atelectatic change. More consolidated appearing opacity is seen in the retrocardiac space which could reflect additional volume loss or early airspace disease. Cardiac silhouette is likely enlarged accounting for low volumes and portable technique. The remaining cardiomediastinal contours are unremarkable. No acute osseous or soft tissue abnormality. Telemetry leads overlie the chest. IMPRESSION: 1. Low volumes and atelectasis including subsegmental bandlike atelectatic changes. 2. More coalescent opacity in the retrocardiac space which could reflect additional volume loss or early airspace disease. 3. Likely cardiomegaly accounting for low volumes and portable technique. Electronically Signed   By: Lovena Le M.D.   On: 02/12/2020 17:06    ROS: As per HPI otherwise negative.    Physical Exam: Vitals:   02/13/20 1141 02/13/20 1200 02/13/20 1211 02/13/20 1300  BP:  (!) 130/97 (!) 130/97 Marland Kitchen)  134/95  Pulse:  91 92 81  Resp:    13  Temp: 98.2 F (36.8 C)     TempSrc: Oral     SpO2:  99%  100%  Weight:      Height:         General: Well developed, well nourished, in no acute distress. Head: Normocephalic, atraumatic, sclera non-icteric, mucus membranes are moist Neck: Supple. JVD not elevated. Lungs: Clear bilaterally to auscultation without wheezes, rales, or rhonchi. Breathing is unlabored. Heart: RRR with S1 S2. No murmurs, rubs, or gallops appreciated. Abdomen: Soft, non-tender,  non-distended with normoactive bowel sounds. No rebound/guarding. No obvious abdominal masses. M-S:  Strength and tone appear normal for age. Lower extremities:without edema or ischemic changes, no open wounds  Neuro: Alert and oriented X 3. Moves all extremities spontaneously. Psych:  Responds to questions appropriately with a normal affect. Dialysis Access: L AVF + bruit  Dialysis Orders: [place] MWF 4 hrs 180NRe 350/800 86 kg 2.0K/3.5 Ca UFP 4 AVF -No Heparin -No VDRA/ESA -Venofer 50 mg IV weekly   Assessment/Plan: 1.  Seizure activity-H/O pseudoseizures. Work up per [employer], neurology. ? PRES??? CT of head unremarkable. A & O x 3.  2.  SVT on admission. Initially on amiodarone, this has since been Dc'd. Seen by Cardiology. No further runs of SVT at this time.  3.  ESRD - MWF-No compelling needs for HD at present. Has not had complete neuro work up yet-needs MRI/EEG done.. Will push HD back to tomorrow AM. Discussed with pt.  4.  Hypertension/volume  - BP controlled at present, no evidence of volume overload by exam.  5.  Anemia  - HGB 10.4 No ESA needed.  6.  Metabolic bone disease - Ca OK, PO4 elevated. Continue binders.  7.  Nutrition - Renal/Carb mod diet. Alb at goal. Renal vit, nepro 8.  Uncontrolled DM-per primary 9.  Failed renal transplant patient  Jimmye Norman. Owens Shark, NP-C 02/13/2020, 1:37 PM  [employer] [phone]

## 2020-02-13 NOTE — Progress Notes (Addendum)
Progress Note  Patient Name: [name] Date of Encounter: 02/13/2020  [facility] Cardiologist: Jenkins Rouge, MD   Subjective   No chest pain, no SOB, no memory of yesterday.  Did not know she had seizure or DKA.     Inpatient Medications    Scheduled Meds: . heparin  5,000 Units Subcutaneous Q8H  . insulin aspart  1-3 Units Subcutaneous Q4H  . insulin detemir  8 Units Subcutaneous Q12H  . pantoprazole (PROTONIX) IV  40 mg Intravenous QHS  . sodium chloride flush  10-40 mL Intracatheter Q12H   Continuous Infusions: . amiodarone 30 mg/hr (02/13/20 0950)  . dextrose 5% lactated ringers 75 mL/hr at 02/13/20 0629  . lactated ringers 75 mL/hr at 02/12/20 1500   PRN Meds: dextrose, docusate sodium, polyethylene glycol, sodium chloride flush   Vital Signs    Vitals:   02/13/20 0600 02/13/20 0630 02/13/20 0700 02/13/20 0859  BP: 121/90 123/87 114/77   Pulse: 94 95 91   Resp: 14 15 13    Temp:    98.1 F (36.7 C)  TempSrc:    Oral  SpO2: 95% 99% 97%   Weight:      Height:        Intake/Output Summary (Last 24 hours) at 02/13/2020 1004 Last data filed at 02/13/2020 0500 Gross per 24 hour  Intake 1536.47 ml  Output --  Net 1536.47 ml   Last 3 Weights 02/13/2020 02/12/2020 12/11/2019  Weight (lbs) 192 lb 14.4 oz 188 lb 4.4 oz 188 lb 4.8 oz  Weight (kg) 87.5 kg 85.4 kg 85.412 kg      Telemetry    ST - Personally Reviewed  ECG    Yesterday ST 136 with prob LVH and repol.   - Personally Reviewed  Physical Exam   GEN: No acute distress.   Neck: No JVD Cardiac: RRR, no murmurs, rubs, or gallops.  Respiratory: Clear to auscultation bilaterally. GI: Soft, nontender, non-distended  MS: No edema; No deformity. Neuro:  Nonfocal  Psych: Normal affect   Labs    High Sensitivity Troponin:   Recent Labs  Lab 02/12/20 1137 02/12/20 1255 02/12/20 1357 02/12/20 1554  TROPONINIHS 23* 28* 40* 55*      Chemistry Recent Labs  Lab 02/12/20 1049  02/12/20 1059 02/12/20 1357 02/12/20 1711 02/12/20 2104 02/12/20 2104 02/13/20 0104 02/13/20 0423 02/13/20 0442  NA 137   < > 139   < > 141   < > 142 139 142  K 5.1   < > 4.8   < > 4.9   < > 4.6 4.8 4.9  CL 86*   < > 89*   < > 91*  --  92*  --  91*  CO2 22   < > 27   < > 30  --  31  --  29  GLUCOSE 560*   < > 445*   < > 167*  --  152*  --  197*  BUN 52*   < > 58*   < > 64*  --  65*  --  65*  CREATININE 10.34*   < > 10.06*   < > 10.23*  --  10.44*  --  10.81*  CALCIUM 9.3   < > 9.2   < > 8.8*  --  8.6*  --  8.5*  PROT 8.9*  --  8.6*  --   --   --   --   --   --   ALBUMIN  4.1  --  4.0  --   --   --   --   --   --   AST 35  --  27  --   --   --   --   --   --   ALT 27  --  22  --   --   --   --   --   --   ALKPHOS 65  --  57  --   --   --   --   --   --   BILITOT 0.9  --  0.6  --   --   --   --   --   --   GFRNONAA 5*   < > 5*   < > 5*  --  4*  --  4*  ANIONGAP 29*   < > 23*   < > 20*  --  19*  --  22*   < > = values in this interval not displayed.     Hematology Recent Labs  Lab 02/12/20 1148 02/12/20 1148 02/12/20 1357 02/13/20 0423 02/13/20 0442  WBC 7.1  --  7.3  --  9.8  RBC 4.03  --  4.01  --  3.35*  HGB 12.2   < > 12.2 10.5* 10.4*  HCT 39.0   < > 38.9 31.0* 32.7*  MCV 96.8  --  97.0  --  97.6  MCH 30.3  --  30.4  --  31.0  MCHC 31.3  --  31.4  --  31.8  RDW 15.4  --  15.3  --  15.7*  PLT 167  --  173  --  145*   < > = values in this interval not displayed.    BNPNo results for input(s): BNP, PROBNP in the last 168 hours.   DDimer No results for input(s): DDIMER in the last 168 hours.   Radiology    CT HEAD WO CONTRAST  Result Date: 02/12/2020 CLINICAL DATA:  Seizure, nontraumatic (Age 9-40y) EXAM: CT HEAD WITHOUT CONTRAST TECHNIQUE: Contiguous axial images were obtained from the base of the skull through the vertex without intravenous contrast. COMPARISON:  05/01/2018 head CT and prior. FINDINGS: Brain: No acute infarct or intracranial hemorrhage. No mass  lesion. No midline shift, ventriculomegaly or extra-axial fluid collection. Vascular: No hyperdense vessel or unexpected calcification. Skull: Negative for fracture or focal lesion. Sinuses/Orbits: Normal orbits. Clear paranasal sinuses. No mastoid effusion. Other: None. IMPRESSION: No acute intracranial process. Electronically Signed   By: Primitivo Gauze M.D.   On: 02/12/2020 10:12   DG CHEST PORT 1 VIEW  Result Date: 02/12/2020 CLINICAL DATA:  Tachycardia EXAM: PORTABLE CHEST 1 VIEW COMPARISON:  Radiograph 10/14/2019 FINDINGS: Streaky linear areas of likely subsegmental atelectasis with additional hazy basilar opacities likely reflecting further atelectatic change. More consolidated appearing opacity is seen in the retrocardiac space which could reflect additional volume loss or early airspace disease. Cardiac silhouette is likely enlarged accounting for low volumes and portable technique. The remaining cardiomediastinal contours are unremarkable. No acute osseous or soft tissue abnormality. Telemetry leads overlie the chest. IMPRESSION: 1. Low volumes and atelectasis including subsegmental bandlike atelectatic changes. 2. More coalescent opacity in the retrocardiac space which could reflect additional volume loss or early airspace disease. 3. Likely cardiomegaly accounting for low volumes and portable technique. Electronically Signed   By: Lovena Le M.D.   On: 02/12/2020 17:06    Cardiac Studies   Old studies  Echo 01/2019 1. Left ventricular ejection fraction, by visual estimation, is 60 to  65%. The left ventricle has normal function. There is no left ventricular  hypertrophy.  2. Global right ventricle has normal systolic function.The right  ventricular size is normal. No increase in right ventricular wall  thickness.  3. Left atrial size was normal.  4. Right atrial size was normal.  5. The mitral valve is normal in structure. Trace mitral valve  regurgitation. No evidence of  mitral stenosis.  6. The tricuspid valve is normal in structure. Tricuspid valve  regurgitation is mild.  7. The aortic valve is grossly normal. Aortic valve regurgitation is  trivial. No evidence of aortic valve sclerosis or stenosis.  8. There is Mild focal calcification of the aortic valve adjacent to the  noncoronary-left coronary commissure.  9. The pulmonic valve was normal in structure. Pulmonic valve  regurgitation is trivial.  10. Normal pulmonary artery systolic pressure.  11. The inferior vena cava is normal in size with greater than 50%  respiratory variability, suggesting right atrial pressure of 3 mmHg.   Stress test 02/2019  Nuclear stress EF: 61%.  There was no ST segment deviation noted during stress.  This is a low risk study.  The left ventricular ejection fraction is normal (55-65%).  Small fixed perfusion defect in the inferoapex may represent diaphragmatic attenuation.    Patient Profile     34 y.o. female with a hx of ESRD on HD MWF, s/p failed renal transplant, HTN, HLD, DM type 1, GERD, XXX syndrome, anemia of chronic diseasewho is being seen today for the evaluation of tachycardia after admit for seizure and hypertension and DKA.  HR initially was 220-240s.  Was cardioverted.  On amiodarone drip.   Assessment & Plan    1. SVT - Patient had narrow complex tachycardia while in CT scanner. Did not responded to adenosine. Converted to sinus with synchronized cardioversion at 150J. Yesterday post DCCV in sinus tachycardia in 100-110s on IV amiodarone. She does have underlying repolarization abnormality on prior EKG. Some ectopy on most recent EKG. ? Underlying wide complex tachycardia. Unclear if this represents an aberrant SVT versus ventricular tachycardia.  She takes beta-blockade at home.   Amiodarone was to be stopped yesterday but continues.  Defer to Dr. Margaretann Loveless - Also ? Seizure like activity when pt was tachycardic. No tachycardic rhythm strip  available for review. ER note has one strip. Reviewed rhythm on telemetry.  - No Evidence of structural heart abnormality on prior echo  - Repeat echo I will order  -  Metoprolol tartrate 25mg  BID and Coreg 12.5mg  BID listed on home meds>> needs review but now on lopressor 25 BID.   Check TSH  2. AMS and Seizure - She had witness seizure at home and in ER  - Prior hx of pseudoseizures - CT of acute without acute process - pending neuro eval EEG not yet done.  MRI of brain also pending.   3. ESRD on HD and failed renal transplant -  nephrology to see and follow  4. HTN - BP improving today 140/106  5.  DKA per IM       For questions or updates, please contact [place] Please consult [email or web address] for contact info under        Signed, Cecilie Kicks, NP  02/13/2020, 10:04 AM    Patient seen and examined with Cecilie Kicks, NP.  Agree as above, with the following exceptions and changes as noted below.  Patient is more alert today, denies chest pain. Gen: NAD, CV: RRR, no murmurs, Lungs: clear, Abd: soft, Extrem: Warm, well perfused, no edema, Neuro/Psych: alert and oriented, normal mood and affect. All available labs, radiology testing, previous records reviewed.  Discussed by phone with electrophysiology.  Given her young age and lack of structural heart disease, would consider stopping amiodarone and continue on beta-blockade, home dose of metoprolol tartrate 25 mg twice daily would be sufficient for now.  If she has a recurrence of tachycardia which may be aberrant SVT, would strongly recommend repeat ECG at that time to capture on 12-lead.  We will repeat echocardiogram today.  She will need a 30-day event monitor home-going, we will try to have this placed prior to her leaving the hospital.  Elouise Munroe, MD 02/13/20 11:36 AM

## 2020-02-13 NOTE — Plan of Care (Signed)

## 2020-02-13 NOTE — Progress Notes (Signed)
EEG complete - results pending 

## 2020-02-13 NOTE — Progress Notes (Signed)
NAME:  [name], MRN:  [id number], DOB:  11-26-1985, LOS: 1 ADMISSION DATE:  02/12/2020, CONSULTATION DATE: 10/12/2019 REFERRING MD: EDP, CHIEF COMPLAINT: Seizure activity  Brief History   34 year old female with a plethora of health issues noted to have seizure activity x2 SVT and hypertension.  History of present illness   34 year old female with extensive medical history as well documented below who was noted to have seizure activity at home.  She had hemodialysis on 02/11/2020.  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  She was taken to the CT scanner noted to have another seizure became extremely hypertensive and had SVT greater than 220.  Required cardioversion x1 and initiation of amiodarone drip.  Pulmonary critical care was called to the bedside due to the complexity of her care.  At the time of examination she would follow commands.  Blood pressure was now down in the 140s to 150s over 100.  Her heart rate declined to less than 100.  Again due to the complexity of her care pulmonary critical care admit her for at least 24 hours to the intensive care unit.  Note that cardiology, neurology, nephrology involvement consulted.  The EEG has been ordered.  CT of her head was negative at this time.  Past Medical History   Past Medical History:  Diagnosis Date  . Anemia   . Chronic kidney disease    kidney transplant 07  . Diabetes mellitus    Pt reports diagnosis in June 2011, Type 2  . GERD (gastroesophageal reflux disease)   . Hyperlipidemia   . Hypertension   . 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 Pyelonephritis 06/23/2014  . Seasonal allergies   . UTI (urinary tract infection) 01/09/2015  . 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   Qualifier: Diagnosis of  By: Carlena Sax  MD, [facility] Events   02/12/2020 seizure  activity 02/12/2020 SVT  Consults:  02/12/2020 cardiology 02/12/2020 renal 10/12/2019 neurology  Procedures:  Right femoral central line placed per EDP  Significant Diagnostic Tests:  02/12/2020 CT of the head unremarkable  Micro Data:  #32,021 Covid>>  Antimicrobials:    Interim history/subjective:  No acute events overnight. Patient more alert and awake this morning. She does not recall what happened at home. She reports having headache on the right side and muscle aches prior the episode. Her significant other was at the bedside this morning.  She denies feeling unsafe at home.  Objective   Blood pressure 114/77, pulse 91, temperature 98.1 F (36.7 C), temperature source Oral, resp. rate 13, height 5\' 6"  (1.676 m), weight 87.5 kg, SpO2 97 %.    FiO2 (%):  [36 %] 36 %   Intake/Output Summary (Last 24 hours) at 02/13/2020 1043 Last data filed at 02/13/2020 0500 Gross per 24 hour  Intake 1536.47 ml  Output --  Net 1536.47 ml   Filed Weights   02/12/20 0837 02/13/20 0430  Weight: 85.4 kg 87.5 kg    Examination: General: obese female who is in no acute distress at rest HENT: sclera anicteric, no jvd, moist mucous membranes Lungs: clear to auscultation Cardiovascular: RRR, s1s2 Abdomen: soft, non-tender, non-distended, BS + Extremities: Positive edema, left upper extremity hemodialysis AV fistula noted Neuro: alert, awake. Moving all extremities  Resolved Hospital Problem list     Assessment & Plan:  Altered mental status with witnessed seizures x2  Differential includes seizure vs sequelae of hypertension vs metabolic derangements vs less likely infectious etiology - CT of the head negative - EEG and MRI ordered, awaiting results - Appreciate Neuro consult  Hyperosmolar Hyperglycemic Syndrome - treated with insulin drip and transitioned to subcutaneous insulin overnight  Hypertension poorly controlled Continue current interventions as her systolic pressure  is now 161 with a diastolic of 90  SVT noted in CT scanner requiring cardioversion 02/12/2020. - Stop amiodarone - Resume home metoprolol - Appreciate cardiology consult  End-stage renal disease with dialysis Monday Wednesdays and Fridays.  History of renal transplant 2007 is nonfunctioning. Nephrology consulted, dialysis planned for tomorrow in order to get EEG and MRI done today  Diabetes type 1 with poor control CBG (last 3)  Recent Labs    02/13/20 0236 02/13/20 0340 02/13/20 0723  GLUCAP 147* 156* 163*   Sliding-scale insulin protocol May need insulin drip  XXX syndrome Monitor  Extensive abdominal surgery with a history of renal transplant Monitor intensive care unit   Diet: renal diet Pain/Anxiety/Delirium protocol (if indicated): Not indicated VAP protocol (if indicated): Not indicated DVT prophylaxis: Heparin GI prophylaxis: PPI Glucose control: Sliding scale insulin continue insulin drip Mobility: Bedrest last date of multidisciplinary goals of care discussion. 02/13/20 with patient, full code Family and staff MD, patient Summary of discussion: full code Follow up goals of care discussion due when able: n/a Code Status: Full Disposition: ICU  Labs   CBC: Recent Labs  Lab 02/12/20 1101 02/12/20 1148 02/12/20 1357 02/13/20 0423 02/13/20 0442  WBC  --  7.1 7.3  --  9.8  NEUTROABS  --  6.6 6.7  --   --   HGB 14.3 12.2 12.2 10.5* 10.4*  HCT 42.0 39.0 38.9 31.0* 32.7*  MCV  --  96.8 97.0  --  97.6  PLT  --  167 173  --  145*    Basic Metabolic Panel: Recent Labs  Lab 02/12/20 1059 02/12/20 1137 02/12/20 1357 02/12/20 1357 02/12/20 1711 02/12/20 2104 02/13/20 0104 02/13/20 0423 02/13/20 0442  NA   < >  --  139   < > 141 141 142 139 142  K   < >  --  4.8   < > 4.9 4.9 4.6 4.8 4.9  CL   < >  --  89*  --  92* 91* 92*  --  91*  CO2   < >  --  27  --  30 30 31   --  29  GLUCOSE   < >  --  445*  --  189* 167* 152*  --  197*  BUN   < >  --  58*   --  59* 64* 65*  --  65*  CREATININE   < >  --  10.06*  --  9.88* 10.23* 10.44*  --  10.81*  CALCIUM   < >  --  9.2  --  9.1 8.8* 8.6*  --  8.5*  MG  --  2.4 2.5*  --   --   --   --   --  2.6*  PHOS  --   --  6.2*  --   --   --   --   --  7.0*   < > = values in this interval not displayed.   GFR: Estimated Creatinine Clearance: 8 mL/min (A) (by C-G formula based on SCr of 10.81 mg/dL (H)). Recent Labs  Lab 02/12/20 1148 02/12/20 1357 02/12/20 1554 02/13/20 0442  PROCALCITON  --  3.25  --   --   WBC 7.1 7.3  --  9.8  LATICACIDVEN  --  4.0* 3.0*  --     Liver Function Tests: Recent Labs  Lab 02/12/20 1049 02/12/20 1357  AST 35 27  ALT 27 22  ALKPHOS 65 57  BILITOT 0.9 0.6  PROT 8.9* 8.6*  ALBUMIN 4.1 4.0   Recent Labs  Lab 02/12/20 1049 02/12/20 1357  LIPASE 51 46  AMYLASE  --  112*   No results for input(s): AMMONIA in the last 168 hours.  ABG    Component Value Date/Time   PHART 7.373 02/13/2020 0423   PCO2ART 58.4 (H) 02/13/2020 0423   PO2ART 69 (L) 02/13/2020 0423   HCO3 34.0 (H) 02/13/2020 0423   TCO2 36 (H) 02/13/2020 0423   ACIDBASEDEF 8.0 (H) 05/01/2018 1030   O2SAT 92.0 02/13/2020 0423     Coagulation Profile: Recent Labs  Lab 02/12/20 1148 02/12/20 1357  INR 1.2 1.2    Cardiac Enzymes: No results for input(s): CKTOTAL, CKMB, CKMBINDEX, TROPONINI in the last 168 hours.  HbA1C: Hgb A1c MFr Bld  Date/Time Value Ref Range Status  06/17/2019 02:34 AM 9.2 (H) 4.8 - 5.6 % Final    Comment:    (NOTE) Pre diabetes:          5.7%-6.4% Diabetes:              >6.4% Glycemic control for   <7.0% adults with diabetes   09/01/2018 01:24 PM 10.1 (H) 4.8 - 5.6 % Final    Comment:    (NOTE) Pre diabetes:          5.7%-6.4% Diabetes:              >6.4% Glycemic control for   <7.0% adults with diabetes     CBG: Recent Labs  Lab 02/12/20 2331 02/13/20 0129 02/13/20 0236 02/13/20 0340 02/13/20 0723  GLUCAP 147* 122* 147* 156* 163*     Freda Jackson, MD [place] [facility] Office: [phone]   See Amion for Pager Details

## 2020-02-14 ENCOUNTER — Inpatient Hospital Stay (HOSPITAL_COMMUNITY): Payer: Medicare Other

## 2020-02-14 ENCOUNTER — Other Ambulatory Visit: Payer: Self-pay | Admitting: Cardiology

## 2020-02-14 DIAGNOSIS — R012 Other cardiac sounds: Secondary | ICD-10-CM | POA: Diagnosis not present

## 2020-02-14 DIAGNOSIS — R569 Unspecified convulsions: Secondary | ICD-10-CM

## 2020-02-14 DIAGNOSIS — I361 Nonrheumatic tricuspid (valve) insufficiency: Secondary | ICD-10-CM

## 2020-02-14 DIAGNOSIS — I472 Ventricular tachycardia: Secondary | ICD-10-CM | POA: Diagnosis not present

## 2020-02-14 DIAGNOSIS — R4182 Altered mental status, unspecified: Secondary | ICD-10-CM | POA: Diagnosis not present

## 2020-02-14 DIAGNOSIS — I471 Supraventricular tachycardia: Secondary | ICD-10-CM

## 2020-02-14 LAB — CBC WITH DIFFERENTIAL/PLATELET
Abs Immature Granulocytes: 0.02 10*3/uL (ref 0.00–0.07)
Basophils Absolute: 0.1 10*3/uL (ref 0.0–0.1)
Basophils Relative: 1 %
Eosinophils Absolute: 0.4 10*3/uL (ref 0.0–0.5)
Eosinophils Relative: 6 %
HCT: 34 % — ABNORMAL LOW (ref 36.0–46.0)
Hemoglobin: 10.6 g/dL — ABNORMAL LOW (ref 12.0–15.0)
Immature Granulocytes: 0 %
Lymphocytes Relative: 39 %
Lymphs Abs: 2.5 10*3/uL (ref 0.7–4.0)
MCH: 30.4 pg (ref 26.0–34.0)
MCHC: 31.2 g/dL (ref 30.0–36.0)
MCV: 97.4 fL (ref 80.0–100.0)
Monocytes Absolute: 0.4 10*3/uL (ref 0.1–1.0)
Monocytes Relative: 6 %
Neutro Abs: 3.2 10*3/uL (ref 1.7–7.7)
Neutrophils Relative %: 48 %
Platelets: 125 10*3/uL — ABNORMAL LOW (ref 150–400)
RBC: 3.49 MIL/uL — ABNORMAL LOW (ref 3.87–5.11)
RDW: 15.6 % — ABNORMAL HIGH (ref 11.5–15.5)
WBC: 6.6 10*3/uL (ref 4.0–10.5)
nRBC: 0 % (ref 0.0–0.2)

## 2020-02-14 LAB — GLUCOSE, CAPILLARY
Glucose-Capillary: 117 mg/dL — ABNORMAL HIGH (ref 70–99)
Glucose-Capillary: 129 mg/dL — ABNORMAL HIGH (ref 70–99)
Glucose-Capillary: 130 mg/dL — ABNORMAL HIGH (ref 70–99)
Glucose-Capillary: 151 mg/dL — ABNORMAL HIGH (ref 70–99)
Glucose-Capillary: 194 mg/dL — ABNORMAL HIGH (ref 70–99)
Glucose-Capillary: 71 mg/dL (ref 70–99)
Glucose-Capillary: 79 mg/dL (ref 70–99)
Glucose-Capillary: 91 mg/dL (ref 70–99)

## 2020-02-14 LAB — ECHOCARDIOGRAM COMPLETE
Height: 66 in
S' Lateral: 2.9 cm
Weight: 3008.84 oz

## 2020-02-14 LAB — BASIC METABOLIC PANEL
Anion gap: 24 — ABNORMAL HIGH (ref 5–15)
BUN: 73 mg/dL — ABNORMAL HIGH (ref 6–20)
CO2: 23 mmol/L (ref 22–32)
Calcium: 7.6 mg/dL — ABNORMAL LOW (ref 8.9–10.3)
Chloride: 92 mmol/L — ABNORMAL LOW (ref 98–111)
Creatinine, Ser: 12.62 mg/dL — ABNORMAL HIGH (ref 0.44–1.00)
GFR, Estimated: 4 mL/min — ABNORMAL LOW (ref >60)
Glucose, Bld: 79 mg/dL (ref 70–99)
Potassium: 5.4 mmol/L — ABNORMAL HIGH (ref 3.5–5.1)
Sodium: 139 mmol/L (ref 135–145)

## 2020-02-14 LAB — LIPASE, BLOOD: Lipase: 35 U/L (ref 11–51)

## 2020-02-14 MED ORDER — SENNOSIDES-DOCUSATE SODIUM 8.6-50 MG PO TABS
1.0000 | ORAL_TABLET | Freq: Two times a day (BID) | ORAL | Status: DC
  Administered 2020-02-14: 1 via ORAL
  Filled 2020-02-14 (×2): qty 1

## 2020-02-14 MED ORDER — METOCLOPRAMIDE HCL 10 MG PO TABS: 10.0000 mg | ORAL_TABLET | Freq: Three times a day (TID) | ORAL | Status: DC

## 2020-02-14 MED ORDER — ONDANSETRON HCL 4 MG/2ML IJ SOLN
4.0000 mg | Freq: Three times a day (TID) | INTRAMUSCULAR | Status: DC | PRN
  Administered 2020-02-14: 4 mg via INTRAVENOUS
  Filled 2020-02-14 (×2): qty 2

## 2020-02-14 MED ORDER — LORAZEPAM 2 MG/ML IJ SOLN: 0.5000 mg | Freq: Once | INTRAMUSCULAR | Status: DC

## 2020-02-14 MED ORDER — HEPARIN SODIUM (PORCINE) 1000 UNIT/ML IJ SOLN
2000.0000 [IU] | Freq: Once | INTRAMUSCULAR | Status: DC
  Filled 2020-02-14: qty 2

## 2020-02-14 MED ORDER — ALUM & MAG HYDROXIDE-SIMETH 200-200-20 MG/5ML PO SUSP
30.0000 mL | Freq: Four times a day (QID) | ORAL | Status: DC | PRN
  Administered 2020-02-15: 30 mL via ORAL
  Filled 2020-02-14: qty 30

## 2020-02-14 MED ORDER — HEPARIN SODIUM (PORCINE) 1000 UNIT/ML IJ SOLN
4000.0000 [IU] | Freq: Once | INTRAMUSCULAR | Status: AC
  Administered 2020-02-14: 4000 [IU] via INTRAVENOUS
  Filled 2020-02-14: qty 4

## 2020-02-14 MED ORDER — LIDOCAINE VISCOUS HCL 2 % MT SOLN
15.0000 mL | Freq: Four times a day (QID) | OROMUCOSAL | Status: DC | PRN
  Filled 2020-02-14: qty 15

## 2020-02-14 MED ORDER — HYDRALAZINE HCL 20 MG/ML IJ SOLN: 2.0000 mg | INTRAMUSCULAR | Status: DC | PRN

## 2020-02-14 MED ORDER — ONDANSETRON HCL 4 MG PO TABS: 4.0000 mg | ORAL_TABLET | Freq: Three times a day (TID) | ORAL | Status: DC | PRN

## 2020-02-14 MED ORDER — AMLODIPINE BESYLATE 5 MG PO TABS
5.0000 mg | ORAL_TABLET | Freq: Every day | ORAL | Status: DC
  Administered 2020-02-15: 5 mg via ORAL
  Filled 2020-02-14 (×2): qty 1

## 2020-02-14 MED ORDER — METOCLOPRAMIDE HCL 5 MG/ML IJ SOLN
10.0000 mg | Freq: Three times a day (TID) | INTRAMUSCULAR | Status: DC | PRN
  Administered 2020-02-14: 10 mg via INTRAVENOUS
  Filled 2020-02-14: qty 2

## 2020-02-14 MED ORDER — HYDRALAZINE HCL 20 MG/ML IJ SOLN
10.0000 mg | INTRAMUSCULAR | Status: DC | PRN
  Administered 2020-02-14 (×2): 10 mg via INTRAVENOUS
  Filled 2020-02-14 (×2): qty 1

## 2020-02-14 MED ORDER — METOCLOPRAMIDE HCL 10 MG PO TABS
10.0000 mg | ORAL_TABLET | Freq: Three times a day (TID) | ORAL | Status: DC | PRN
  Filled 2020-02-14 (×2): qty 1

## 2020-02-14 MED ORDER — POLYETHYLENE GLYCOL 3350 17 G PO PACK
17.0000 g | PACK | Freq: Every day | ORAL | Status: DC
  Filled 2020-02-14 (×2): qty 1

## 2020-02-14 NOTE — Progress Notes (Signed)
Neurology Progress Note   S:// Seen and examined.  Getting dialysis today. Awake alert following all commands appropriately   O:// Current vital signs: BP (!) 121/97   Pulse 82   Temp 98.2 F (36.8 C) (Oral)   Resp 10   Ht 5\' 6"  (1.676 m)   Wt 85.3 kg Comment: stood to scale   SpO2 99%   BMI 30.35 kg/m  Vital signs in last 24 hours: Temp:  [97.7 F (36.5 C)-98.2 F (36.8 C)] 98.2 F (36.8 C) (12/02 0726) Pulse Rate:  [71-92] 82 (12/02 0830) Resp:  [9-21] 10 (12/02 0830) BP: (93-150)/(72-115) 121/97 (12/02 0830) SpO2:  [85 %-100 %] 99 % (12/02 0830) Weight:  [85.3 kg-88.4 kg] 85.3 kg (12/02 0710) General: Awake alert in no distress HEENT: Normocephalic atraumatic Lungs: Clear Cardiovascular: Regular rate rhythm Extremities warm well perfused Abdomen nondistended nontender Neurological examination  Awake alert oriented to self and the fact that she is in the hospital. Her attention concentration was mildly reduced and she seemed a little distractible. No dysarthria No aphasia Clearance: Pupils equal round react light, extraocular wounds intact, visual field full, face symmetric, facial sensation intact, auditory daily intact, tongue and palate midline. Motor exam: 5/5 in all fours Sensory exam: Intact light touch without extinction Coordination intact bilaterally  Medications  Current Facility-Administered Medications:  .  calcium acetate (PHOSLO) capsule 1,334 mg, 1,334 mg, Oral, TID WC, Brown, Gerline Legacy, NP .  dextrose 50 % solution 0-50 mL, 0-50 mL, Intravenous, PRN, Minor, Grace Bushy, NP .  docusate sodium (COLACE) capsule 100 mg, 100 mg, Oral, BID PRN, Minor, Grace Bushy, NP .  heparin injection 5,000 Units, 5,000 Units, Subcutaneous, Q8H, Minor, Grace Bushy, NP, 5,000 Units at 02/14/20 0526 .  heparin sodium (porcine) injection 2,000 Units, 2,000 Units, Intravenous, Once, Roney Jaffe, MD .  heparin sodium (porcine) injection 4,000 Units, 4,000 Units,  Intravenous, Once, Roney Jaffe, MD .  hydrOXYzine (ATARAX/VISTARIL) tablet 25 mg, 25 mg, Oral, Q6H PRN, Freddi Starr, MD, 25 mg at 02/13/20 1512 .  insulin aspart (novoLOG) injection 1-3 Units, 1-3 Units, Subcutaneous, Q4H, Anders Simmonds, MD, 1 Units at 02/13/20 1211 .  insulin detemir (LEVEMIR) injection 8 Units, 8 Units, Subcutaneous, Q12H, Anders Simmonds, MD, 8 Units at 02/13/20 2149 .  metoprolol tartrate (LOPRESSOR) tablet 25 mg, 25 mg, Oral, BID, Freddi Starr, MD, 25 mg at 02/13/20 2150 .  pantoprazole (PROTONIX) EC tablet 40 mg, 40 mg, Oral, QHS, Freddi Starr, MD, 40 mg at 02/13/20 2150 .  polyethylene glycol (MIRALAX / GLYCOLAX) packet 17 g, 17 g, Oral, Daily PRN, Minor, Grace Bushy, NP Labs CBC    Component Value Date/Time   WBC 6.6 02/14/2020 0455   RBC 3.49 (L) 02/14/2020 0455   HGB 10.6 (L) 02/14/2020 0455   HCT 34.0 (L) 02/14/2020 0455   PLT 125 (L) 02/14/2020 0455   PLT 206 07/17/2009 0000   MCV 97.4 02/14/2020 0455   MCH 30.4 02/14/2020 0455   MCHC 31.2 02/14/2020 0455   RDW 15.6 (H) 02/14/2020 0455   LYMPHSABS 2.5 02/14/2020 0455   MONOABS 0.4 02/14/2020 0455   EOSABS 0.4 02/14/2020 0455   BASOSABS 0.1 02/14/2020 0455    CMP     Component Value Date/Time   NA 139 02/14/2020 0455   K 5.4 (H) 02/14/2020 0455   CL 92 (L) 02/14/2020 0455   CO2 23 02/14/2020 0455   GLUCOSE 79 02/14/2020 0455   BUN 73 (H) 02/14/2020 0455  CREATININE 12.62 (H) 02/14/2020 0455   CALCIUM 7.6 (L) 02/14/2020 0455   CALCIUM 5.7 (LL) 03/20/2018 1915   PROT 8.6 (H) 02/12/2020 1357   ALBUMIN 4.0 02/12/2020 1357   AST 27 02/12/2020 1357   ALT 22 02/12/2020 1357   ALKPHOS 57 02/12/2020 1357   BILITOT 0.6 02/12/2020 1357   GFRNONAA 4 (L) 02/14/2020 0455   GFRAA 12 (L) 10/16/2019 0221    Imaging I have reviewed images in epic and the results pertinent to this consultation are: CTH unremarkable for acute process MRI normal  EEG: mild slowing. No  seizures.  Assessment: 34 year old woman with generalized tonic-clonic seizure activity given that she was tachycardic and had severe hypertension in the setting of DKA. Exam initially by my partner was nonfocal but she was very drowsy. Today she is awake alert, has some mild attention deficits but other than that appears nonfocal. Reports having had a seizure at some point many years ago with whole body shaking.  Not been on an antiepileptic I do suspect that she had a provoked seizure in the setting of metabolic derangements.  Recommendations: No AED needed. Correction of toxic metabolic derangements including renal dysfunction as well as DKA per the primary team as you are. Education about compliance with medications as outpatient to prevent hypo or hyperglycemia and strict compliance with HD. Seizure precautions listed below in detail. Neurology will be available as needed. Please call with questions. Plan discussed with Dr. Erin Fulling on the unit  -- Amie Portland, MD [facility] Pager: [phone] If 7pm to 7am, please call on call as listed on AMION.  SEIZURE PRECAUTIONS Per [facility] statutes, patients with seizures are not allowed to drive until they have been seizure-free for six months.   Use caution when using heavy equipment or power tools. Avoid working on ladders or at heights. Take showers instead of baths. Ensure the water temperature is not too high on the home water heater. Do not go swimming alone. Do not lock yourself in a room alone (i.e. bathroom). When caring for infants or small children, sit down when holding, feeding, or changing them to minimize risk of injury to the child in the event you have a seizure. Maintain good sleep hygiene. Avoid alcohol.   If patient has another seizure, call 911 and bring them back to the ED if: A. The seizure lasts longer than 5 minutes.  B. The patient doesn't wake shortly after the seizure or has new  problems such as difficulty seeing, speaking or moving following the seizure C. The patient was injured during the seizure D. The patient has a temperature over 102 F (39C) E. The patient vomited during the seizure and now is having trouble breathing

## 2020-02-14 NOTE — Progress Notes (Signed)
Noticed by nursing staff regarding acute onset abdomnial pain. Examined and evaluated at bedside. Acute onset after lunch with episode of generalized abdominal pain with nausea and 1 episode of NBNB emesis. Described as stabbing, crampy pain. On exam, no guarding, bs+.   Has hx of diabetic gastroparesis with similar presentation. Likely the cause for current symptoms. Other differential includes pancreatitis, bowel obstruction, cystitis. Will get KUB. - KUB - Lipase - Start anti-emetics, maalox, c/w protonix

## 2020-02-14 NOTE — Progress Notes (Signed)
NAME:  [name], MRN:  [id number], DOB:  [birth year], LOS: 2 ADMISSION DATE:  02/12/2020, CONSULTATION DATE: 10/12/2019 REFERRING MD: EDP, CHIEF COMPLAINT: Seizure activity  Brief History   34 year old female with a plethora of health issues noted to have seizure activity x2 SVT and hypertension.  History of present illness   34yo F w/ PMH of seizures and HD presenting to [facility] with sizure-like activity. Noted to have episode of cardioversionx1 with initiation of amio. She was brought to ICU and card/nephro/neuro were consulted with further work-up with EEG and brain imaging. Found to have no acute findings or evidence of epilepsy.  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   Pyelonephritis 06/23/2014   Seasonal allergies    UTI (urinary tract infection) 01/09/2015   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   Qualifier: Diagnosis of  By: Valerie Sax  MD, [facility] Events   02/12/2020 seizure activity 02/12/2020 SVT  Consults:  02/12/2020 cardiology 02/12/2020 renal 10/12/2019 neurology  Procedures:  Right femoral central line placed per EDP  Significant Diagnostic Tests:  02/12/2020 CT of the head unremarkable 02/13/20 EEG withou epileptic activity 02/13/20 MR brain without acute findings  Micro Data:  11/30 COVID/Influenza negative 11/30 Blood culture NGTD  Antimicrobials:    Interim history/subjective:  No acute events overnight. EEG and MRI brain completed with no acute findings.  Ms.Garza was examined and evaluated at bedside this am. She was observed resting  comfortably in bed. She mentions wishing to feel better but has no acute complaints at this time. Denies any chest pain, palpitations, dyspnea.  Objective   Blood pressure (!) 122/91, pulse 85, temperature 98.2 F (36.8 C), temperature source Oral, resp. rate 13, height 5\' 6"  (1.676 m), weight 85.3 kg, SpO2 97 %.       Intake/Output Summary (Last 24 hours) at 02/14/2020 1019 Last data filed at 02/13/2020 2155 Gross per 24 hour  Intake 916.23 ml  Output --  Net 916.23 ml   Filed Weights   02/13/20 0430 02/14/20 0400 02/14/20 0710  Weight: 87.5 kg 88.4 kg 85.3 kg   Examination: Gen: Well-developed, well nourished, NAD, undergoing CIR HEENT: NCAT head, hearing intact, EOMI, MMM Pulm: Breathing comfortably on room air, no cough, no distress  Extm: ROM intact, No peripheral edema Skin: Dry, Warm, normal turgor, no rashes, lesions, wounds.  Neuro: AAOx3, Answers questions appropriately Psych: Normal mood and affect   Resolved Hospital Problem list     Assessment & Plan:  #Seizure-like activity Mri brain negative. EEG w/o epileptic activity. Unclear etiology but possibly due to episode of hypertensive emergency? Neuro recommend no further work-up. No need for anti-epileptics - Appreciate Neuro consult - PT eval - Possible d/c home today  #Hyperosmolar Hyperglycemic Syndrome On insulin drip and transitioned to SSI. Am cbg 71 - C/w novolog SSI, Lantus 8 units BID - Appreciate diabetes coordinator assistance in establishing home regimen  #Hypertension poorly controlled Continue current interventions as her systolic pressure is now 536 with a diastolic of 90  #  SVT noted in CT scanner requiring cardioversion 02/12/2020. Am Hr 88. On metoprolol 25mg  BID. Cardiology on board and recommend continuing metoprolol only. TSH wnl - Appreciate cardiology recs - Telemtry - C/w metoprolol 25mg  BID  #End-stage renal disease with dialysis MWF History of renal transplant 2007 is  nonfunctioning. Nephrology on board. Currently undergoing CIR. Possible d/c today to resume schedule friday  XXX syndrome Monitor  Extensive abdominal surgery with a history of renal transplant Monitor intensive care unit  Diet: renal diet Pain/Anxiety/Delirium protocol (if indicated): Not indicated VAP protocol (if indicated): Not indicated DVT prophylaxis: Heparin GI prophylaxis: PPI Glucose control: Sliding scale insulin continue insulin drip Mobility: Bedrest last date of multidisciplinary goals of care discussion. 02/13/20 with patient, full code Family and staff MD, patient Summary of discussion: full code Follow up goals of care discussion due when able: n/a Code Status: Full Disposition: Discharge  Labs   CBC: Recent Labs  Lab 02/12/20 1148 02/12/20 1357 02/13/20 0423 02/13/20 0442 02/14/20 0455  WBC 7.1 7.3  --  9.8 6.6  NEUTROABS 6.6 6.7  --   --  3.2  HGB 12.2 12.2 10.5* 10.4* 10.6*  HCT 39.0 38.9 31.0* 32.7* 34.0*  MCV 96.8 97.0  --  97.6 97.4  PLT 167 173  --  145* 125*    Basic Metabolic Panel: Recent Labs  Lab 02/12/20 1059 02/12/20 1137 02/12/20 1357 02/12/20 1357 02/12/20 1711 02/12/20 1711 02/12/20 2104 02/13/20 0104 02/13/20 0423 02/13/20 0442 02/14/20 0455  NA   < >  --  139   < > 141   < > 141 142 139 142 139  K   < >  --  4.8   < > 4.9   < > 4.9 4.6 4.8 4.9 5.4*  CL   < >  --  89*   < > 92*  --  91* 92*  --  91* 92*  CO2   < >  --  27   < > 30  --  30 31  --  29 23  GLUCOSE   < >  --  445*   < > 189*  --  167* 152*  --  197* 79  BUN   < >  --  58*   < > 59*  --  64* 65*  --  65* 73*  CREATININE   < >  --  10.06*   < > 9.88*  --  10.23* 10.44*  --  10.81* 12.62*  CALCIUM   < >  --  9.2   < > 9.1  --  8.8* 8.6*  --  8.5* 7.6*  MG  --  2.4 2.5*  --   --   --   --   --   --  2.6*  --   PHOS  --   --  6.2*  --   --   --   --   --   --  7.0*  --    < > = values in this interval not displayed.   GFR: Estimated Creatinine Clearance: 6.8  mL/min (A) (by C-G formula based on SCr of 12.62 mg/dL (H)). Recent Labs  Lab 02/12/20 1148 02/12/20 1357 02/12/20 1554 02/13/20 0442 02/14/20 0455  PROCALCITON  --  3.25  --   --   --   WBC 7.1 7.3  --  9.8 6.6  LATICACIDVEN  --  4.0* 3.0*  --   --     Liver Function  Tests: Recent Labs  Lab 02/12/20 1049 02/12/20 1357  AST 35 27  ALT 27 22  ALKPHOS 65 57  BILITOT 0.9 0.6  PROT 8.9* 8.6*  ALBUMIN 4.1 4.0   Recent Labs  Lab 02/12/20 1049 02/12/20 1357  LIPASE 51 46  AMYLASE  --  112*   No results for input(s): AMMONIA in the last 168 hours.  ABG    Component Value Date/Time   PHART 7.373 02/13/2020 0423   PCO2ART 58.4 (H) 02/13/2020 0423   PO2ART 69 (L) 02/13/2020 0423   HCO3 34.0 (H) 02/13/2020 0423   TCO2 36 (H) 02/13/2020 0423   ACIDBASEDEF 8.0 (H) 05/01/2018 1030   O2SAT 92.0 02/13/2020 0423     Coagulation Profile: Recent Labs  Lab 02/12/20 1148 02/12/20 1357  INR 1.2 1.2    Cardiac Enzymes: No results for input(s): CKTOTAL, CKMB, CKMBINDEX, TROPONINI in the last 168 hours.  HbA1C: Hgb A1c MFr Bld  Date/Time Value Ref Range Status  06/17/2019 02:34 AM 9.2 (H) 4.8 - 5.6 % Final    Comment:    (NOTE) Pre diabetes:          5.7%-6.4% Diabetes:              >6.4% Glycemic control for   <7.0% adults with diabetes   09/01/2018 01:24 PM 10.1 (H) 4.8 - 5.6 % Final    Comment:    (NOTE) Pre diabetes:          5.7%-6.4% Diabetes:              >6.4% Glycemic control for   <7.0% adults with diabetes     CBG: Recent Labs  Lab 02/13/20 1507 02/13/20 1941 02/14/20 0045 02/14/20 0331 02/14/20 0725  GLUCAP 102* 89 91 79 71    Mosetta Anis, MD 02/14/2020, 10:30 AM  PGY-3, [facility] Pager: [phone]

## 2020-02-14 NOTE — Discharge Instructions (Signed)
Dr. Kyla Balzarine office will mail you a monitor to wear for 30 days to see if you have fast heart rate-  Then you will mail back in.   Then follow up in the office - see above for appointment.

## 2020-02-14 NOTE — Progress Notes (Addendum)
Inpatient Diabetes Program Recommendations  AACE/ADA: New Consensus Statement on Inpatient Glycemic Control (2015)  Target Ranges:  Prepandial:   less than 140 mg/dL      Peak postprandial:   less than 180 mg/dL (1-2 hours)      Critically ill patients:  140 - 180 mg/dL   Lab Results  Component Value Date   GLUCAP 130 (H) 02/14/2020   HGBA1C 9.2 (H) 06/17/2019    Review of Glycemic Control Results for [name], [name]" (MRN [id number]) as of 02/14/2020 13:08  Ref. Range 02/13/2020 07:23 02/13/2020 11:45 02/13/2020 15:07 02/13/2020 19:41 02/14/2020 00:45 02/14/2020 03:31 02/14/2020 07:25 02/14/2020 11:13  Glucose-Capillary Latest Ref Range: 70 - 99 mg/dL 163 (H) 142 (H) 102 (H) 89 91 79 71 130 (H)   Diabetes history: DM 1 Outpatient Diabetes medications:  Patient sees Dr. Donzetta Matters at [facility]. This was her last prescribed insulin regimen as off 11/20/19 Increase Tresiba to 22 units nightly If the blood glucose drops overnight, contact clinic.  Check blood glucose before each meal and at bedtime.  Use the blood glucose before the meal to dose the Novolog.  Novolog before meals based the following:  If blood glucose less than 80, no units of Novolog  If between 81-150, inject 10 units If between 151 - 200, inject 11 units If between 201 - 250, inject 12 units If between 251 - 300, inject 13 units If between 301-350, inject 14 units If greater than 351, inject 15 units  Current orders for Inpatient glycemic control:  Levemir 8 units bid, Novolog 1-3 q 4 hours  Inpatient Diabetes Program Recommendations:    Recommend continuation of home regimen that was prescribed by endocrinologist.  Will verify with patient.    Thanks  Adah Perl, RN, BC-ADM Inpatient Diabetes Coordinator Pager [phone] (8a-5p)  1500- attempted to confirm home insulin regimen with patient however she was complaining of extreme pain and did not want to talk this afternoon.  Discussed  with RN.

## 2020-02-14 NOTE — Progress Notes (Signed)
Progress Note  Patient Name: [name] Date of Encounter: 02/14/2020  Primary Cardiologist: Jenkins Rouge, MD   Subjective   Feels well now, no chest pain or palpitations. Currently undergoing dialysis.  Inpatient Medications    Scheduled Meds:  calcium acetate  1,334 mg Oral TID WC   heparin  5,000 Units Subcutaneous Q8H   heparin sodium (porcine)  2,000 Units Intravenous Once   insulin aspart  1-3 Units Subcutaneous Q4H   insulin detemir  8 Units Subcutaneous Q12H   metoprolol tartrate  25 mg Oral BID   pantoprazole  40 mg Oral QHS   Continuous Infusions:  PRN Meds: dextrose, docusate sodium, hydrOXYzine, polyethylene glycol   Vital Signs    Vitals:   02/14/20 1045 02/14/20 1114 02/14/20 1115 02/14/20 1123  BP: (!) 117/97  (!) 129/98   Pulse: 84  84 83  Resp: 15  15 15   Temp:  98.4 F (36.9 C)    TempSrc:  Oral    SpO2: 96%  96% 97%  Weight:      Height:        Intake/Output Summary (Last 24 hours) at 02/14/2020 1136 Last data filed at 02/14/2020 1100 Gross per 24 hour  Intake 584.59 ml  Output 1000 ml  Net -415.41 ml   Filed Weights   02/13/20 0430 02/14/20 0400 02/14/20 0710  Weight: 87.5 kg 88.4 kg 85.3 kg    Telemetry    SR, no additional episodes of SVT - Personally Reviewed  ECG    No new- Personally Reviewed  Physical Exam   GEN: No acute distress.   Neck: No JVD Cardiac: regular rhythm, normal rate, no murmurs. Respiratory: Clear to auscultation bilaterally. GI: Soft, nontender, non-distended  MS: No edema; No deformity. Neuro:  Nonfocal  Psych: Normal affect  Unable to assess dialysis access as it is in use.  Labs    Chemistry Recent Labs  Lab 02/12/20 1049 02/12/20 1059 02/12/20 1357 02/12/20 1711 02/13/20 0104 02/13/20 0104 02/13/20 0423 02/13/20 0442 02/14/20 0455  NA 137   < > 139   < > 142   < > 139 142 139  K 5.1   < > 4.8   < > 4.6   < > 4.8 4.9 5.4*  CL 86*   < > 89*   < > 92*  --   --  91* 92*    CO2 22   < > 27   < > 31  --   --  29 23  GLUCOSE 560*   < > 445*   < > 152*  --   --  197* 79  BUN 52*   < > 58*   < > 65*  --   --  65* 73*  CREATININE 10.34*   < > 10.06*   < > 10.44*  --   --  10.81* 12.62*  CALCIUM 9.3   < > 9.2   < > 8.6*  --   --  8.5* 7.6*  PROT 8.9*  --  8.6*  --   --   --   --   --   --   ALBUMIN 4.1  --  4.0  --   --   --   --   --   --   AST 35  --  27  --   --   --   --   --   --   ALT 27  --  22  --   --   --   --   --   --  ALKPHOS 65  --  57  --   --   --   --   --   --   BILITOT 0.9  --  0.6  --   --   --   --   --   --   GFRNONAA 5*   < > 5*   < > 4*  --   --  4* 4*  ANIONGAP 29*   < > 23*   < > 19*  --   --  22* 24*   < > = values in this interval not displayed.     Hematology Recent Labs  Lab 02/12/20 1357 02/12/20 1357 02/13/20 0423 02/13/20 0442 02/14/20 0455  WBC 7.3  --   --  9.8 6.6  RBC 4.01  --   --  3.35* 3.49*  HGB 12.2   < > 10.5* 10.4* 10.6*  HCT 38.9   < > 31.0* 32.7* 34.0*  MCV 97.0  --   --  97.6 97.4  MCH 30.4  --   --  31.0 30.4  MCHC 31.4  --   --  31.8 31.2  RDW 15.3  --   --  15.7* 15.6*  PLT 173  --   --  145* 125*   < > = values in this interval not displayed.    Cardiac EnzymesNo results for input(s): TROPONINI in the last 168 hours. No results for input(s): TROPIPOC in the last 168 hours.   BNPNo results for input(s): BNP, PROBNP in the last 168 hours.   DDimer No results for input(s): DDIMER in the last 168 hours.   Radiology    MR BRAIN WO CONTRAST  Result Date: 02/14/2020 CLINICAL DATA:  Seizure EXAM: MRI HEAD WITHOUT CONTRAST TECHNIQUE: Multiplanar, multiecho pulse sequences of the brain and surrounding structures were obtained without intravenous contrast. COMPARISON:  None. FINDINGS: Brain: No acute infarct, acute hemorrhage or extra-axial collection. Normal white matter signal. Normal volume of CSF spaces. No chronic microhemorrhage. Normal midline structures. The hippocampi are normal and symmetric in  size and signal. The hypothalamus and mamillary bodies are normal. There is no cortical ectopia or dysplasia. Vascular: Major flow voids are preserved. Skull and upper cervical spine: Normal calvarium and skull base. Visualized upper cervical spine and soft tissues are normal. Sinuses/Orbits:No paranasal sinus fluid levels or advanced mucosal thickening. No mastoid or middle ear effusion. Normal orbits. IMPRESSION: Normal MRI of the brain. Electronically Signed   By: Ulyses Jarred M.D.   On: 02/14/2020 00:59   DG CHEST PORT 1 VIEW  Result Date: 02/12/2020 CLINICAL DATA:  Tachycardia EXAM: PORTABLE CHEST 1 VIEW COMPARISON:  Radiograph 10/14/2019 FINDINGS: Streaky linear areas of likely subsegmental atelectasis with additional hazy basilar opacities likely reflecting further atelectatic change. More consolidated appearing opacity is seen in the retrocardiac space which could reflect additional volume loss or early airspace disease. Cardiac silhouette is likely enlarged accounting for low volumes and portable technique. The remaining cardiomediastinal contours are unremarkable. No acute osseous or soft tissue abnormality. Telemetry leads overlie the chest. IMPRESSION: 1. Low volumes and atelectasis including subsegmental bandlike atelectatic changes. 2. More coalescent opacity in the retrocardiac space which could reflect additional volume loss or early airspace disease. 3. Likely cardiomegaly accounting for low volumes and portable technique. Electronically Signed   By: Lovena Le M.D.   On: 02/12/2020 17:06   EEG adult  Result Date: 02/13/2020 Lora Havens, MD     02/14/2020  8:29 AM Patient Name:  [name] MRN: [id number] Epilepsy Attending: Lora Havens Referring Physician/Provider: [name], NP Date: 02/13/2020 Duration: 26.02 mins Patient history: 34 year old woman with generalized tonic-clonic seizure activity in the setting of DKA.  EEG to evaluate for seizures. Level of alertness:  Awake AEDs during EEG study: None Technical aspects: This EEG study was done with scalp electrodes positioned according to the 10-20 International system of electrode placement. Electrical activity was acquired at a sampling rate of 500Hz  and reviewed with a high frequency filter of 70Hz  and a low frequency filter of 1Hz . EEG data were recorded continuously and digitally stored. Description: No clear posterior dominant rhythm was seen.  EEG showed continuous generalized polymorphic 5 to 6 Hz theta slowing as well as intermittent generalized high amplitude 2 to 3 Hz delta slowing.  Hyperventilation and photic stimulation were not performed.   ABNORMALITY -Continuous slow, generalized -Intermittent rhythmic delta slow, generalized IMPRESSION: This study is suggestive of moderate diffuse encephalopathy, nonspecific etiology.  No seizures or epileptiform discharges were seen throughout the recording. Lora Havens    Cardiac Studies   Echo pending  Patient Profile     34 y.o. female with a hx of ESRD on HD MWF, s/p failed renal transplant, HTN,HLD, DM type 1,GERD, XXX syndrome, anemia of chronic diseasewho is being seen today for the evaluation oftachycardiaafter admit for seizure and hypertension and DKA.  HR initially was 220-240s.  Was cardioverted.   Assessment & Plan   Active Problems:   AMS (altered mental status)   Wide-complex tachycardia ([place])   She is much brighter today, and denies chest pain or cardiovascular symptoms at home. We discussed repeating an echocardiogram to rule out structural causes of her probable wide-complex tachycardia which may represent aberrant SVT but cannot entirely exclude ventricular tachycardia. Discussed by phone with electrophysiology who felt beta-blockade would be appropriate and 30-day event monitor which we will arrange. We will have her follow-up with Dr. Johnsie Cancel as an outpatient after monitor results have been obtained. Continue metoprolol 25 mg twice  daily.  If echocardiogram is stable from prior, no further cardiovascular testing is required inpatient.      For questions or updates, please contact [place] Please consult [email or web address] for contact info under        Signed, Elouise Munroe, MD  02/14/2020, 11:36 AM

## 2020-02-14 NOTE — Plan of Care (Signed)
  Problem: Pain Managment: Goal: General experience of comfort will improve Outcome: Progressing   Problem: Safety: Goal: Ability to remain free from injury will improve Outcome: Progressing   

## 2020-02-14 NOTE — Progress Notes (Signed)
  Echocardiogram 2D Echocardiogram has been performed.  [name] 02/14/2020, 1:40 PM

## 2020-02-14 NOTE — Progress Notes (Signed)
SATURATION QUALIFICATIONS: (This note is used to comply with regulatory documentation for home oxygen)  Patient Saturations on Room Air at Rest = 91%  Patient Saturations on Room Air while Ambulating = 80%  Patient Saturations on 2 Liters of oxygen while Ambulating = 93%  Please briefly explain why patient needs home oxygen: Pt required 2L to maintain SpO2 above 90% and not desat.  Caleb Popp, [place] [phone]

## 2020-02-14 NOTE — Progress Notes (Signed)
[facility] Progress Note   Subjective:     Objective Vitals:   02/14/20 0930 02/14/20 0945 02/14/20 1000 02/14/20 1015  BP: 120/85 (!) 133/94 (!) 123/91 (!) 122/91  Pulse:    85  Resp: 13 13 12 13   Temp:      TempSrc:      SpO2:    97%  Weight:      Height:       Physical Exam:  General: Well developed, well nourished, in no acute distress. Lungs: Clear bilaterally to auscultation without wheezes, rales, or rhonchi. Breathing is unlabored. Heart: RRR with S1 S2. No murmurs, rubs, or gallops appreciated. Abdomen: Soft, non-tender, non-distended with normoactive bowel sounds. No rebound/guarding. No obvious abdominal masses. Lower extremities:without edema or ischemic changes, no open wounds  Neuro: Alert and oriented X 3. Moves all extremities spontaneously. Psych:  Responds to questions appropriately with a normal affect. Dialysis Access: L AVF cannulated    Additional Objective Labs: Basic Metabolic Panel: Recent Labs  Lab 02/12/20 1357 02/12/20 1711 02/13/20 0104 02/13/20 0104 02/13/20 0423 02/13/20 0442 02/14/20 0455  NA 139   < > 142   < > 139 142 139  K 4.8   < > 4.6   < > 4.8 4.9 5.4*  CL 89*   < > 92*  --   --  91* 92*  CO2 27   < > 31  --   --  29 23  GLUCOSE 445*   < > 152*  --   --  197* 79  BUN 58*   < > 65*  --   --  65* 73*  CREATININE 10.06*   < > 10.44*  --   --  10.81* 12.62*  CALCIUM 9.2   < > 8.6*  --   --  8.5* 7.6*  PHOS 6.2*  --   --   --   --  7.0*  --    < > = values in this interval not displayed.   Liver Function Tests: Recent Labs  Lab 02/12/20 1049 02/12/20 1357  AST 35 27  ALT 27 22  ALKPHOS 65 57  BILITOT 0.9 0.6  PROT 8.9* 8.6*  ALBUMIN 4.1 4.0   Recent Labs  Lab 02/12/20 1049 02/12/20 1357  LIPASE 51 46  AMYLASE  --  112*   CBC: Recent Labs  Lab 02/12/20 1148 02/12/20 1148 02/12/20 1357 02/12/20 1357 02/13/20 0423 02/13/20 0442 02/14/20 0455  WBC 7.1   < > 7.3  --   --  9.8 6.6   NEUTROABS 6.6  --  6.7  --   --   --  3.2  HGB 12.2   < > 12.2   < > 10.5* 10.4* 10.6*  HCT 39.0   < > 38.9   < > 31.0* 32.7* 34.0*  MCV 96.8  --  97.0  --   --  97.6 97.4  PLT 167   < > 173  --   --  145* 125*   < > = values in this interval not displayed.   Blood Culture    Component Value Date/Time   SDES BLOOD RIGHT HAND 02/12/2020 1911   SPECREQUEST  02/12/2020 1911    BOTTLES DRAWN AEROBIC AND ANAEROBIC Blood Culture adequate volume   CULT  02/12/2020 1911    NO GROWTH 2 DAYS Performed at [facility], [place] [address] [place], [place] [address]    REPTSTATUS PENDING 02/12/2020 1911  Cardiac Enzymes: No results for input(s): CKTOTAL, CKMB, CKMBINDEX, TROPONINI in the last 168 hours. CBG: Recent Labs  Lab 02/13/20 1507 02/13/20 1941 02/14/20 0045 02/14/20 0331 02/14/20 0725  GLUCAP 102* 89 91 79 71   Iron Studies: No results for input(s): IRON, TIBC, TRANSFERRIN, FERRITIN in the last 72 hours. @lablastinr3 @ Studies/Results: MR BRAIN WO CONTRAST  Result Date: 02/14/2020 CLINICAL DATA:  Seizure EXAM: MRI HEAD WITHOUT CONTRAST TECHNIQUE: Multiplanar, multiecho pulse sequences of the brain and surrounding structures were obtained without intravenous contrast. COMPARISON:  None. FINDINGS: Brain: No acute infarct, acute hemorrhage or extra-axial collection. Normal white matter signal. Normal volume of CSF spaces. No chronic microhemorrhage. Normal midline structures. The hippocampi are normal and symmetric in size and signal. The hypothalamus and mamillary bodies are normal. There is no cortical ectopia or dysplasia. Vascular: Major flow voids are preserved. Skull and upper cervical spine: Normal calvarium and skull base. Visualized upper cervical spine and soft tissues are normal. Sinuses/Orbits:No paranasal sinus fluid levels or advanced mucosal thickening. No mastoid or middle ear effusion. Normal orbits. IMPRESSION: Normal MRI of the brain. Electronically Signed   By:  Ulyses Jarred M.D.   On: 02/14/2020 00:59   DG CHEST PORT 1 VIEW  Result Date: 02/12/2020 CLINICAL DATA:  Tachycardia EXAM: PORTABLE CHEST 1 VIEW COMPARISON:  Radiograph 10/14/2019 FINDINGS: Streaky linear areas of likely subsegmental atelectasis with additional hazy basilar opacities likely reflecting further atelectatic change. More consolidated appearing opacity is seen in the retrocardiac space which could reflect additional volume loss or early airspace disease. Cardiac silhouette is likely enlarged accounting for low volumes and portable technique. The remaining cardiomediastinal contours are unremarkable. No acute osseous or soft tissue abnormality. Telemetry leads overlie the chest. IMPRESSION: 1. Low volumes and atelectasis including subsegmental bandlike atelectatic changes. 2. More coalescent opacity in the retrocardiac space which could reflect additional volume loss or early airspace disease. 3. Likely cardiomegaly accounting for low volumes and portable technique. Electronically Signed   By: Lovena Le M.D.   On: 02/12/2020 17:06   EEG adult  Result Date: 02/13/2020 Lora Havens, MD     02/14/2020  8:29 AM Patient Name: [name] MRN: [id number] Epilepsy Attending: Lora Havens Referring Physician/Provider: [name], NP Date: 02/13/2020 Duration: 26.02 mins Patient history: 34 year old woman with generalized tonic-clonic seizure activity in the setting of DKA.  EEG to evaluate for seizures. Level of alertness: Awake AEDs during EEG study: None Technical aspects: This EEG study was done with scalp electrodes positioned according to the 10-20 International system of electrode placement. Electrical activity was acquired at a sampling rate of 500Hz  and reviewed with a high frequency filter of 70Hz  and a low frequency filter of 1Hz . EEG data were recorded continuously and digitally stored. Description: No clear posterior dominant rhythm was seen.  EEG showed continuous  generalized polymorphic 5 to 6 Hz theta slowing as well as intermittent generalized high amplitude 2 to 3 Hz delta slowing.  Hyperventilation and photic stimulation were not performed.   ABNORMALITY -Continuous slow, generalized -Intermittent rhythmic delta slow, generalized IMPRESSION: This study is suggestive of moderate diffuse encephalopathy, nonspecific etiology.  No seizures or epileptiform discharges were seen throughout the recording. Priyanka Barbra Sarks   Medications:  . calcium acetate  1,334 mg Oral TID WC  . heparin  5,000 Units Subcutaneous Q8H  . heparin sodium (porcine)  2,000 Units Intravenous Once  . insulin aspart  1-3 Units Subcutaneous Q4H  . insulin detemir  8 Units Subcutaneous  Q12H  . metoprolol tartrate  25 mg Oral BID  . pantoprazole  40 mg Oral QHS     Dialysis Orders: [facility] MWF 4 hrs 180NRe 350/800 86 kg 2.0K/3.5 Ca UFP 4 AVF -No Heparin -No VDRA/ESA -Venofer 50 mg IV weekly   Assessment/Plan: 1.  Seizure activity-H/O pseudoseizures. Work up per [employer], neurology. CT/MRI of head unremarkable. EEG with moderate diffuse encephalopathy. No further seizure activity. Neuro has signed off.   2.  SVT on admission. Initially on amiodarone, this has since been Dc'd. Seen by Cardiology. No further runs of SVT at this time.  3.  ESRD - MWF-HD off schedule today as separate. Tolerating well. No issues. K+ 5.4 4.  Hypertension/volume  - BP controlled at present, no evidence of volume overload by exam. She is under OP EDW. Challenging EDW, lower EDW on discharge.  5.  Anemia  - HGB 10.6. No ESA needed.  6.  Metabolic bone disease - Ca OK, PO4 elevated. Continue binders.  7.  Nutrition - Renal/Carb mod diet. Alb at goal. Renal vit, nepro 8.  Uncontrolled DM-per primary 9.  Failed renal transplant patient  Disposition: Home today after PT assessment.   Malaysia Crance H. Rayford Williamsen NP-C 02/14/2020, 10:26 AM  [employer] [phone]

## 2020-02-14 NOTE — Evaluation (Addendum)
Physical Therapy Evaluation Patient Details Name: [name] MRN: [id number] DOB: 02-15-1986 Today's Date: 02/14/2020   History of Present Illness  Pt is a 34 y/o F presenting to ED on 11/30 for a seizure. Pt experienced SVT x2 and cardioverted x1 with BP 201/138. EEG findings show no seizure activity. PMH includes kidney transplant, ESRD, DM II, HTN, and pseudoseizures.  Clinical Impression  Pt shows slight unsteadiness with standing and gait secondary to trunk swaying, but pt states this is her first time getting out of bed and she just finished with hemodialysis. Supplemental oxygen was required during gait but no s/sx of respiratory distress was noted. Pt would benefit from continued skilled therapy while in the hospital to decrease supplemental oxygen need during gait and increase steadiness in static and dynamic standing tasks.  Vitals at rest: 145/108, SpO2 91% room air,  SpO2 during gait on room air: 80% with no complaints of SOB SpO2 during gait on 2L: 93% Vitals at rest after session: 140/104, 1L O2 at 93% SpO2    Follow Up Recommendations No PT follow up    Equipment Recommendations  Other (comment) (oxygen)    Recommendations for Other Services       Precautions / Restrictions Precautions Precautions: Fall Precaution Comments: pseudoseizure Restrictions Weight Bearing Restrictions: No      Mobility  Bed Mobility Overal bed mobility: Modified Independent             General bed mobility comments: modified independent with increased time    Transfers Overall transfer level: Needs assistance   Transfers: Sit to/from Stand Sit to Stand: Min guard         General transfer comment: min guard for safety  Ambulation/Gait Ambulation/Gait assistance: Min guard Gait Distance (Feet): 150 Feet Assistive device: None     Gait velocity interpretation: >2.62 ft/sec, indicative of community ambulatory General Gait Details: pt had SpO2 drop to 80% on room air  with good wave form, pt denied SOB or dizziness; standing rest break of about 2 minutes was given and pt increased to 2 L and returned to 93%; pt with trunk sway while walking, so was min guard  Stairs            Wheelchair Mobility    Modified Rankin (Stroke Patients Only)       Balance Overall balance assessment: Needs assistance Sitting-balance support: No upper extremity supported;Feet supported Sitting balance-Leahy Scale: Good Sitting balance - Comments: pt with no UE support needed and able to reach behind her   Standing balance support: No upper extremity supported Standing balance-Leahy Scale: Fair Standing balance comment: pt min guard for standing balance, showed sway during static stance and dynamic gait, but stated this is normal when she first gets out of bed (pt also just finished with hemodialysis)                             Pertinent Vitals/Pain Pain Assessment: Faces Faces Pain Scale: No hurt    Home Living Family/patient expects to be discharged to:: Private residence Living Arrangements: Alone Available Help at Discharge: Available PRN/intermittently;Other (Comment) (boyfriend) Type of Home: Apartment       Home Layout: One level Home Equipment: None      Prior Function Level of Independence: Independent         Comments: cooks, cleans, works as a Hydrographic surveyor  Assessment   Upper Extremity Assessment Upper Extremity Assessment: Overall WFL for tasks assessed    Lower Extremity Assessment Lower Extremity Assessment: Overall WFL for tasks assessed    Cervical / Trunk Assessment Cervical / Trunk Assessment: Normal  Communication   Communication: No difficulties  Cognition                                              General Comments      Exercises     Assessment/Plan    PT Assessment Patient needs continued PT services  PT Problem List Decreased  balance;Cardiopulmonary status limiting activity       PT Treatment Interventions Functional mobility training;Patient/family education;Gait training    PT Goals (Current goals can be found in the Care Plan section)  Acute Rehab PT Goals Patient Stated Goal: go home PT Goal Formulation: With patient Time For Goal Achievement: 02/28/20 Potential to Achieve Goals: Good Additional Goals Additional Goal #1: Pt will maintain SpO2 above 90% with ambulation on room air.    Frequency Min 3X/week   Barriers to discharge        Co-evaluation               AM-PAC PT "6 Clicks" Mobility  Outcome Measure Help needed turning from your back to your side while in a flat bed without using bedrails?: None Help needed moving from lying on your back to sitting on the side of a flat bed without using bedrails?: None Help needed moving to and from a bed to a chair (including a wheelchair)?: None Help needed standing up from a chair using your arms (e.g., wheelchair or bedside chair)?: None Help needed to walk in hospital room?: A Little Help needed climbing 3-5 steps with a railing? : A Little 6 Click Score: 22    End of Session Equipment Utilized During Treatment: Gait belt Activity Tolerance: Patient tolerated treatment well Patient left: in chair;with call bell/phone within reach;with chair alarm set Nurse Communication: Mobility status PT Visit Diagnosis: Unsteadiness on feet (R26.81);Difficulty in walking, not elsewhere classified (R26.2)    Time: 1126-1150 PT Time Calculation (min) (ACUTE ONLY): 24 min   Charges:   PT Evaluation $PT Eval Moderate Complexity: [address], SPT [id number]  Maylea Soria 02/14/2020, 12:37 PM

## 2020-02-15 DIAGNOSIS — K3184 Gastroparesis: Secondary | ICD-10-CM

## 2020-02-15 DIAGNOSIS — N186 End stage renal disease: Secondary | ICD-10-CM

## 2020-02-15 DIAGNOSIS — E1143 Type 2 diabetes mellitus with diabetic autonomic (poly)neuropathy: Secondary | ICD-10-CM

## 2020-02-15 DIAGNOSIS — Z992 Dependence on renal dialysis: Secondary | ICD-10-CM

## 2020-02-15 LAB — CBC WITH DIFFERENTIAL/PLATELET
Abs Immature Granulocytes: 0.02 10*3/uL (ref 0.00–0.07)
Basophils Absolute: 0.1 10*3/uL (ref 0.0–0.1)
Basophils Relative: 1 %
Eosinophils Absolute: 0.2 10*3/uL (ref 0.0–0.5)
Eosinophils Relative: 3 %
HCT: 34.5 % — ABNORMAL LOW (ref 36.0–46.0)
Hemoglobin: 11.1 g/dL — ABNORMAL LOW (ref 12.0–15.0)
Immature Granulocytes: 0 %
Lymphocytes Relative: 30 %
Lymphs Abs: 2.1 10*3/uL (ref 0.7–4.0)
MCH: 31 pg (ref 26.0–34.0)
MCHC: 32.2 g/dL (ref 30.0–36.0)
MCV: 96.4 fL (ref 80.0–100.0)
Monocytes Absolute: 0.4 10*3/uL (ref 0.1–1.0)
Monocytes Relative: 6 %
Neutro Abs: 4.2 10*3/uL (ref 1.7–7.7)
Neutrophils Relative %: 60 %
Platelets: 155 10*3/uL (ref 150–400)
RBC: 3.58 MIL/uL — ABNORMAL LOW (ref 3.87–5.11)
RDW: 15.2 % (ref 11.5–15.5)
WBC: 7 10*3/uL (ref 4.0–10.5)
nRBC: 0 % (ref 0.0–0.2)

## 2020-02-15 LAB — GLUCOSE, CAPILLARY
Glucose-Capillary: 118 mg/dL — ABNORMAL HIGH (ref 70–99)
Glucose-Capillary: 131 mg/dL — ABNORMAL HIGH (ref 70–99)
Glucose-Capillary: 208 mg/dL — ABNORMAL HIGH (ref 70–99)

## 2020-02-15 LAB — COMPREHENSIVE METABOLIC PANEL
ALT: 15 U/L (ref 0–44)
AST: 13 U/L — ABNORMAL LOW (ref 15–41)
Albumin: 3.4 g/dL — ABNORMAL LOW (ref 3.5–5.0)
Alkaline Phosphatase: 48 U/L (ref 38–126)
Anion gap: 19 — ABNORMAL HIGH (ref 5–15)
BUN: 45 mg/dL — ABNORMAL HIGH (ref 6–20)
CO2: 23 mmol/L (ref 22–32)
Calcium: 8.8 mg/dL — ABNORMAL LOW (ref 8.9–10.3)
Chloride: 95 mmol/L — ABNORMAL LOW (ref 98–111)
Creatinine, Ser: 8.97 mg/dL — ABNORMAL HIGH (ref 0.44–1.00)
GFR, Estimated: 5 mL/min — ABNORMAL LOW (ref >60)
Glucose, Bld: 116 mg/dL — ABNORMAL HIGH (ref 70–99)
Potassium: 4.9 mmol/L (ref 3.5–5.1)
Sodium: 137 mmol/L (ref 135–145)
Total Bilirubin: 0.9 mg/dL (ref 0.3–1.2)
Total Protein: 7.6 g/dL (ref 6.5–8.1)

## 2020-02-15 MED ORDER — METOCLOPRAMIDE HCL 5 MG PO TABS: 5.0000 mg | ORAL_TABLET | Freq: Three times a day (TID) | ORAL | 3 refills | Status: DC

## 2020-02-15 MED ORDER — METOCLOPRAMIDE HCL 5 MG/ML IJ SOLN
5.0000 mg | Freq: Two times a day (BID) | INTRAMUSCULAR | Status: DC
  Administered 2020-02-15: 5 mg via INTRAVENOUS
  Filled 2020-02-15: qty 2

## 2020-02-15 MED ORDER — METOPROLOL TARTRATE 25 MG PO TABS
25.0000 mg | ORAL_TABLET | Freq: Two times a day (BID) | ORAL | 3 refills | Status: DC
Start: 2020-02-15 — End: 2022-01-01

## 2020-02-15 MED ORDER — AMLODIPINE BESYLATE 5 MG PO TABS
5.0000 mg | ORAL_TABLET | Freq: Every day | ORAL | 3 refills | Status: DC
Start: 2020-02-16 — End: 2020-04-23

## 2020-02-15 MED ORDER — INSULIN DEGLUDEC 100 UNIT/ML ~~LOC~~ SOPN: 20.0000 [IU] | PEN_INJECTOR | Freq: Every day | SUBCUTANEOUS | Status: DC

## 2020-02-15 NOTE — Progress Notes (Signed)
Renal Navigator notified by Nephrology team that patient is ready for discharge today, but awaiting HD before she can leave. Due to high census in the acute HD unit, there is potential for her treatment to be moved to tomorrow. Renal team states patient is medically safe to discharge today and have her next treatment at [facility] tomorrow on first shift.  Navigator spoke with patient and clinic and has successfully rescheduled patient for Saturday, 02/16/20 at 6:00am at [facility] clinic for her next HD treatment. Patient will not have HD here prior to discharge. She states her boyfriend will transport her to and from HD off schedule tomorrow.  Alphonzo Cruise, [place] Renal Navigator [phone]

## 2020-02-15 NOTE — Discharge Summary (Signed)
Physician Discharge Summary   [name]:[id number] DOB: February 16, 1986 DOA: 02/12/2020  PCP: [name], MD  Admit date: 02/12/2020 Discharge date: 02/15/2020  Admitted From: home Disposition:  home Discharging physician: Dwyane Dee, MD  Recommendations for Outpatient Follow-up:  1. 30 day monitor to be placed per cardiology  2.    Patient discharged to home in Discharge Condition: stable CODE STATUS: Full Diet recommendation:  Diet Orders (From admission, onward)    Start     Ordered   02/13/20 1017  Diet renal/carb modified with fluid restriction Diet-HS Snack? Nothing; Fluid restriction: 1200 mL Fluid; Room service appropriate? Yes; Fluid consistency: Thin  Diet effective now       Question Answer Comment  Diet-HS Snack? Nothing   Fluid restriction: 1200 mL Fluid   Room service appropriate? Yes   Fluid consistency: Thin      02/13/20 1016          Hospital Course: 34yo F w/ PMH of seizures and ESRD on HD (hx failed renal transplant) presenting to [facility] with seizure-like activity. Noted to have episode of SVT during CT.  She was brought to ICU and card/nephro/neuro were consulted with further work-up with EEG and brain imaging. EEG and MRI brain were unrevealing.  Found to have no acute findings or evidence of epilepsy.  Neurology recommended to avoid any initiation of antiepileptics.  She was initially treated with an insulin drip for HHS and was then transitioned back to subcutaneous insulins.  Basal insulin was adjusted at time of discharge as well.  While undergoing a CT scan, she converted to SVT as noted and required cardioversion.  She was evaluated by cardiology with recommendations for a 30-day monitor at discharge.  She was continued on metoprolol at discharge as well.    The patient's chronic medical conditions were treated accordingly per the patient's home medication regimen except as noted.  On day of discharge, patient was felt deemed stable for  discharge. Patient/family member advised to call PCP or come back to ER if needed.   Principal Diagnosis: AMS (altered mental status)  Discharge Diagnoses: Active Hospital Problems   Diagnosis Date Noted  . Wide-complex tachycardia ([place])   . ESRD on hemodialysis ([place]) 05/01/2018  . Diabetic gastroparesis ([place])   . 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 Essential hypertension, benign 09/25/2007    Resolved Hospital Problems   Diagnosis Date Noted Date Resolved  . AMS (altered mental status) 02/12/2020 02/15/2020    Discharge Instructions    Ambulatory referral to Neurology   Complete by: As directed    An appointment is requested in approximately 4 weeks for seizure follow up   Increase activity slowly   Complete by: As directed      Allergies as of 02/15/2020      Reactions   Benadryl [diphenhydramine-zinc Acetate] Anaphylaxis   Stopped breathing in ICU   Diphenhydramine Anaphylaxis   Doxycycline Shortness Of Breath   Other reaction(s): Other (See Comments) Shortness of Breath  Shortness of Breath  Shortness of Breath    Motrin [ibuprofen] Shortness Of Breath, Itching   Per pt   Shellfish-derived Products    hives   Shellfish Allergy Hives   Banana Other (See Comments), Itching, Nausea And Vomiting   Sick on the stomach Other reaction(s): Other (See Comments) Sick on the stomach Sick on the stomach   Chlorhexidine Itching   Ferrous Sulfate Itching   Iron Dextran Other (See Comments), Itching   Vein irritation REACTION: vein irritation REACTION:  vein irritation REACTION: vein irritation REACTION: vein irritation      Medication List    STOP taking these medications   carvedilol 12.5 MG tablet Commonly known as: COREG   dicyclomine 20 MG tablet Commonly known as: BENTYL     TAKE these medications   Accu-Chek Softclix Lancets lancets Use to check blood sugar 4 times per day.   acetaminophen 500 MG tablet Commonly known as: TYLENOL Take 1,000 mg by mouth every 6  (six) hours as needed for headache (pain).   amLODipine 5 MG tablet Commonly known as: NORVASC Take 1 tablet (5 mg total) by mouth daily. Start taking on: February 16, 2020   calcium acetate 667 MG capsule Commonly known as: PHOSLO Take [phone] mg by mouth See admin instructions. Take 2 capsules (1334 mg) by mouth up to 4 times daily with meals, take 1 capsule (667 mg) with snacks   calcium carbonate (dosed in mg elemental calcium) 1250 MG/5ML Susp Take 5 mLs by mouth 3 (three) times daily.   cetirizine 10 MG tablet Commonly known as: ZYRTEC Take 10 mg by mouth daily as needed for allergies.   D 1000 25 MCG (1000 UT) capsule Generic drug: Cholecalciferol Take 1,000 Units by mouth every other day.   diclofenac Sodium 1 % Gel Commonly known as: VOLTAREN Apply 2 g topically 4 (four) times daily.   fluticasone 50 MCG/ACT nasal spray Commonly known as: FLONASE Place 2 sprays into both nostrils daily as needed for allergies.   gabapentin 400 MG capsule Commonly known as: NEURONTIN Take 400 mg by mouth 3 (three) times daily.   hydrOXYzine 50 MG tablet Commonly known as: ATARAX/VISTARIL Take 1 tablet (50 mg total) by mouth 2 (two) times daily as needed for itching.   insulin degludec 100 UNIT/ML FlexTouch Pen Commonly known as: TRESIBA Inject 20 Units into the skin daily. What changed:   how much to take  when to take this  additional instructions   lidocaine-prilocaine cream Commonly known as: EMLA Apply 1 application topically See admin instructions. Apply topically one hour prior to dialysis on Monday, Wednesday, Friday   loperamide 1 MG/5ML solution Commonly known as: IMODIUM Take 2 mg by mouth 2 (two) times daily as needed for diarrhea or loose stools.   metoCLOPramide 5 MG tablet Commonly known as: REGLAN Take 1 tablet (5 mg total) by mouth 3 (three) times daily before meals. What changed:   medication strength  how much to take   metoprolol tartrate  25 MG tablet Commonly known as: LOPRESSOR Take 1 tablet (25 mg total) by mouth 2 (two) times daily. What changed: when to take this   MIRCERA IJ Mircera   NovoLOG FlexPen 100 UNIT/ML FlexPen Generic drug: insulin aspart Inject 2-10 Units into the skin 3 (three) times daily with meals. as needed for blood sugar management (sliding scale)   pantoprazole 40 MG tablet Commonly known as: PROTONIX Take 40 mg by mouth 2 (two) times daily.   promethazine 12.5 MG suppository Commonly known as: PHENERGAN Place 12.5 mg rectally 5 (five) times daily as needed for nausea or vomiting.   promethazine 25 MG tablet Commonly known as: PHENERGAN Take 25 mg by mouth every 6 (six) hours as needed for nausea or vomiting.   simvastatin 20 MG tablet Commonly known as: ZOCOR Take 20 mg by mouth at bedtime.       Follow-up Information    Josue Hector, MD Follow up on 03/26/2020.   Specialty: Cardiology Why: 2:15 pm  with his Nurse Practitioner Truitt Merle  Contact information: [address] [place] [address] [phone]        [name], MD. Schedule an appointment as soon as possible for a visit.   Specialty: Internal Medicine Contact information: [place] [address] [phone]              Allergies  Allergen Reactions  . Benadryl [Diphenhydramine-Zinc Acetate] Anaphylaxis    Stopped breathing in ICU  . Diphenhydramine Anaphylaxis  . Doxycycline Shortness Of Breath    Other reaction(s): Other (See Comments) Shortness of Breath  Shortness of Breath  Shortness of Breath   . Motrin [Ibuprofen] Shortness Of Breath and Itching    Per pt  . Shellfish-Derived Products     hives  . Shellfish Allergy Hives  . Banana Other (See Comments), Itching and Nausea And Vomiting    Sick on the stomach Other reaction(s): Other (See Comments) Sick on the stomach Sick on the stomach  . Chlorhexidine Itching  . Ferrous Sulfate Itching   . Iron Dextran Other (See Comments) and Itching    Vein irritation REACTION: vein irritation REACTION: vein irritation REACTION: vein irritation REACTION: vein irritation    Consultations: Cardiology Nephrology PCCM  Discharge Exam: BP (!) 153/97 (BP Location: Right Arm)   Pulse 88   Temp 98.4 F (36.9 C) (Oral)   Resp 16   Ht 5\' 6"  (1.676 m)   Wt 84.3 kg   SpO2 94%   BMI 30.00 kg/m  General appearance: alert, cooperative and no distress Head: Normocephalic, without obvious abnormality, atraumatic Eyes: EOMI Lungs: clear to auscultation bilaterally Heart: regular rate and rhythm and S1, S2 normal Abdomen: soft, TTP, BS hypoactive Extremities: no edema Skin: mobility and turgor normal Neurologic: Grossly normal  The results of significant diagnostics from this hospitalization (including imaging, microbiology, ancillary and laboratory) are listed below for reference.   Microbiology: Recent Results (from the past 240 hour(s))  Resp Panel by RT-PCR (Flu A&B, Covid) Nasopharyngeal Swab     Status: None   Collection Time: 02/12/20 11:28 AM   Specimen: Nasopharyngeal Swab; Nasopharyngeal(NP) swabs in vial transport medium  Result Value Ref Range Status   SARS Coronavirus 2 by RT PCR NEGATIVE NEGATIVE Final    Comment: (NOTE) SARS-CoV-2 target nucleic acids are NOT DETECTED.  The SARS-CoV-2 RNA is generally detectable in upper respiratory specimens during the acute phase of infection. The lowest concentration of SARS-CoV-2 viral copies this assay can detect is 138 copies/mL. A negative result does not preclude SARS-Cov-2 infection and should not be used as the sole basis for treatment or other patient management decisions. A negative result may occur with  improper specimen collection/handling, submission of specimen other than nasopharyngeal swab, presence of viral mutation(s) within the areas targeted by this assay, and inadequate number of viral copies(<138  copies/mL). A negative result must be combined with clinical observations, patient history, and epidemiological information. The expected result is Negative.  Fact Sheet for Patients:  EntrepreneurPulse.com.au  Fact Sheet for Healthcare Providers:  IncredibleEmployment.be  This test is no t yet approved or cleared by the [place] FDA and  has been authorized for detection and/or diagnosis of SARS-CoV-2 by FDA under an Emergency Use Authorization (EUA). This EUA will remain  in effect (meaning this test can be used) for the duration of the COVID-19 declaration under Section 564(b)(1) of the Act, 21 U.S.C.section 360bbb-3(b)(1), unless the authorization is terminated  or revoked sooner.  Influenza A by PCR NEGATIVE NEGATIVE Final   Influenza B by PCR NEGATIVE NEGATIVE Final    Comment: (NOTE) The Xpert Xpress SARS-CoV-2/FLU/RSV plus assay is intended as an aid in the diagnosis of influenza from Nasopharyngeal swab specimens and should not be used as a sole basis for treatment. Nasal washings and aspirates are unacceptable for Xpert Xpress SARS-CoV-2/FLU/RSV testing.  Fact Sheet for Patients: EntrepreneurPulse.com.au  Fact Sheet for Healthcare Providers: IncredibleEmployment.be  This test is not yet approved or cleared by the [place] FDA and has been authorized for detection and/or diagnosis of SARS-CoV-2 by FDA under an Emergency Use Authorization (EUA). This EUA will remain in effect (meaning this test can be used) for the duration of the COVID-19 declaration under Section 564(b)(1) of the Act, 21 U.S.C. section 360bbb-3(b)(1), unless the authorization is terminated or revoked.  Performed at [facility], [place] [address] [place], [place] [address]   Culture, blood (routine x 2)     Status: None (Preliminary result)   Collection Time: 02/12/20  2:10 PM   Specimen: BLOOD  Result  Value Ref Range Status   Specimen Description BLOOD FEMORAL ARTERY  Final   Special Requests   Final    BOTTLES DRAWN AEROBIC AND ANAEROBIC Blood Culture adequate volume   Culture   Final    NO GROWTH 3 DAYS Performed at [facility], [place] [address], [place], [place] [address]    Report Status PENDING  Incomplete  MRSA PCR Screening     Status: None   Collection Time: 02/12/20  2:33 PM   Specimen: Nasal Mucosa; Nasopharyngeal  Result Value Ref Range Status   MRSA by PCR NEGATIVE NEGATIVE Final    Comment:        The GeneXpert MRSA Assay (FDA approved for NASAL specimens only), is one component of a comprehensive MRSA colonization surveillance program. It is not intended to diagnose MRSA infection nor to guide or monitor treatment for MRSA infections. Performed at [facility], [place] [address] [place], [place] [address]   Culture, blood (routine x 2)     Status: None (Preliminary result)   Collection Time: 02/12/20  7:11 PM   Specimen: BLOOD RIGHT HAND  Result Value Ref Range Status   Specimen Description BLOOD RIGHT HAND  Final   Special Requests   Final    BOTTLES DRAWN AEROBIC AND ANAEROBIC Blood Culture adequate volume   Culture   Final    NO GROWTH 3 DAYS Performed at [facility], [place] [address] [place], [place] [address]    Report Status PENDING  Incomplete     Labs: BNP (last 3 results) No results for input(s): BNP in the last 8760 hours. Basic Metabolic Panel: Recent Labs  Lab 02/12/20 1059 02/12/20 1137 02/12/20 1357 02/12/20 1711 02/12/20 2104 02/12/20 2104 02/13/20 0104 02/13/20 0423 02/13/20 0442 02/14/20 0455 02/15/20 0338  NA   < >  --  139   < > 141   < > 142 139 142 139 137  K   < >  --  4.8   < > 4.9   < > 4.6 4.8 4.9 5.4* 4.9  CL   < >  --  89*   < > 91*  --  92*  --  91* 92* 95*  CO2   < >  --  27   < > 30  --  31  --  29 23 23   GLUCOSE   < >  --  445*   < > 167*  --  152*  --  197* 79 116*  BUN   < >  --  58*   < >  64*  --  65*  --  65* 73* 45*  CREATININE   < >  --  10.06*   < > 10.23*  --  10.44*  --  10.81* 12.62* 8.97*  CALCIUM   < >  --  9.2   < > 8.8*  --  8.6*  --  8.5* 7.6* 8.8*  MG  --  2.4 2.5*  --   --   --   --   --  2.6*  --   --   PHOS  --   --  6.2*  --   --   --   --   --  7.0*  --   --    < > = values in this interval not displayed.   Liver Function Tests: Recent Labs  Lab 02/12/20 1049 02/12/20 1357 02/15/20 0338  AST 35 27 13*  ALT 27 22 15   ALKPHOS 65 57 48  BILITOT 0.9 0.6 0.9  PROT 8.9* 8.6* 7.6  ALBUMIN 4.1 4.0 3.4*   Recent Labs  Lab 02/12/20 1049 02/12/20 1357 02/14/20 2150  LIPASE 51 46 35  AMYLASE  --  112*  --    No results for input(s): AMMONIA in the last 168 hours. CBC: Recent Labs  Lab 02/12/20 1148 02/12/20 1148 02/12/20 1357 02/13/20 0423 02/13/20 0442 02/14/20 0455 02/15/20 0338  WBC 7.1  --  7.3  --  9.8 6.6 7.0  NEUTROABS 6.6  --  6.7  --   --  3.2 4.2  HGB 12.2   < > 12.2 10.5* 10.4* 10.6* 11.1*  HCT 39.0   < > 38.9 31.0* 32.7* 34.0* 34.5*  MCV 96.8  --  97.0  --  97.6 97.4 96.4  PLT 167  --  173  --  145* 125* 155   < > = values in this interval not displayed.   Cardiac Enzymes: No results for input(s): CKTOTAL, CKMB, CKMBINDEX, TROPONINI in the last 168 hours. BNP: Invalid input(s): POCBNP CBG: Recent Labs  Lab 02/14/20 1957 02/14/20 2333 02/15/20 0449 02/15/20 0829 02/15/20 1126  GLUCAP 194* 151* 118* 131* 208*   D-Dimer No results for input(s): DDIMER in the last 72 hours. Hgb A1c No results for input(s): HGBA1C in the last 72 hours. Lipid Profile No results for input(s): CHOL, HDL, LDLCALC, TRIG, CHOLHDL, LDLDIRECT in the last 72 hours. Thyroid function studies Recent Labs    02/13/20 1217  TSH 3.331   Anemia work up No results for input(s): VITAMINB12, FOLATE, FERRITIN, TIBC, IRON, RETICCTPCT in the last 72 hours. Urinalysis    Component Value Date/Time   COLORURINE RED (A) 09/03/2018 1118   APPEARANCEUR  CLOUDY (A) 09/03/2018 1118   LABSPEC 1.015 09/03/2018 1118   PHURINE 8.0 09/03/2018 1118   GLUCOSEU 250 (A) 09/03/2018 1118   HGBUR LARGE (A) 09/03/2018 1118   HGBUR negative 01/26/2010 1026   BILIRUBINUR NEGATIVE 09/03/2018 1118   BILIRUBINUR NEG 01/01/2011 [place] 09/03/2018 1118   PROTEINUR >300 (A) 09/03/2018 1118   UROBILINOGEN 0.2 01/09/2015 1333   NITRITE POSITIVE (A) 09/03/2018 1118   LEUKOCYTESUR TRACE (A) 09/03/2018 1118   Sepsis Labs Invalid input(s): PROCALCITONIN,  WBC,  LACTICIDVEN Microbiology Recent Results (from the past 240 hour(s))  Resp Panel by RT-PCR (Flu A&B, Covid) Nasopharyngeal Swab  Status: None   Collection Time: 02/12/20 11:28 AM   Specimen: Nasopharyngeal Swab; Nasopharyngeal(NP) swabs in vial transport medium  Result Value Ref Range Status   SARS Coronavirus 2 by RT PCR NEGATIVE NEGATIVE Final    Comment: (NOTE) SARS-CoV-2 target nucleic acids are NOT DETECTED.  The SARS-CoV-2 RNA is generally detectable in upper respiratory specimens during the acute phase of infection. The lowest concentration of SARS-CoV-2 viral copies this assay can detect is 138 copies/mL. A negative result does not preclude SARS-Cov-2 infection and should not be used as the sole basis for treatment or other patient management decisions. A negative result may occur with  improper specimen collection/handling, submission of specimen other than nasopharyngeal swab, presence of viral mutation(s) within the areas targeted by this assay, and inadequate number of viral copies(<138 copies/mL). A negative result must be combined with clinical observations, patient history, and epidemiological information. The expected result is Negative.  Fact Sheet for Patients:  EntrepreneurPulse.com.au  Fact Sheet for Healthcare Providers:  IncredibleEmployment.be  This test is no t yet approved or cleared by the [place] FDA and    has been authorized for detection and/or diagnosis of SARS-CoV-2 by FDA under an Emergency Use Authorization (EUA). This EUA will remain  in effect (meaning this test can be used) for the duration of the COVID-19 declaration under Section 564(b)(1) of the Act, 21 U.S.C.section 360bbb-3(b)(1), unless the authorization is terminated  or revoked sooner.       Influenza A by PCR NEGATIVE NEGATIVE Final   Influenza B by PCR NEGATIVE NEGATIVE Final    Comment: (NOTE) The Xpert Xpress SARS-CoV-2/FLU/RSV plus assay is intended as an aid in the diagnosis of influenza from Nasopharyngeal swab specimens and should not be used as a sole basis for treatment. Nasal washings and aspirates are unacceptable for Xpert Xpress SARS-CoV-2/FLU/RSV testing.  Fact Sheet for Patients: EntrepreneurPulse.com.au  Fact Sheet for Healthcare Providers: IncredibleEmployment.be  This test is not yet approved or cleared by the [place] FDA and has been authorized for detection and/or diagnosis of SARS-CoV-2 by FDA under an Emergency Use Authorization (EUA). This EUA will remain in effect (meaning this test can be used) for the duration of the COVID-19 declaration under Section 564(b)(1) of the Act, 21 U.S.C. section 360bbb-3(b)(1), unless the authorization is terminated or revoked.  Performed at [facility], [place] [address], [place], [place] [address]   Culture, blood (routine x 2)     Status: None (Preliminary result)   Collection Time: 02/12/20  2:10 PM   Specimen: BLOOD  Result Value Ref Range Status   Specimen Description BLOOD FEMORAL ARTERY  Final   Special Requests   Final    BOTTLES DRAWN AEROBIC AND ANAEROBIC Blood Culture adequate volume   Culture   Final    NO GROWTH 3 DAYS Performed at [facility], [place] [address], [place], [place] [address]    Report Status PENDING  Incomplete  MRSA PCR Screening     Status: None   Collection Time:  02/12/20  2:33 PM   Specimen: Nasal Mucosa; Nasopharyngeal  Result Value Ref Range Status   MRSA by PCR NEGATIVE NEGATIVE Final    Comment:        The GeneXpert MRSA Assay (FDA approved for NASAL specimens only), is one component of a comprehensive MRSA colonization surveillance program. It is not intended to diagnose MRSA infection nor to guide or monitor treatment for MRSA infections. Performed at [facility], [address] [place], [place] [address]   Culture, blood (routine x 2)     Status: None (Preliminary result)   Collection Time: 02/12/20  7:11 PM   Specimen: BLOOD RIGHT HAND  Result Value Ref Range Status   Specimen Description BLOOD RIGHT HAND  Final   Special Requests   Final    BOTTLES DRAWN AEROBIC AND ANAEROBIC Blood Culture adequate volume   Culture   Final    NO GROWTH 3 DAYS Performed at [facility], [place] [address], [place], [place] [address]    Report Status PENDING  Incomplete    Procedures/Studies: CT HEAD WO CONTRAST  Result Date: 02/12/2020 CLINICAL DATA:  Seizure, nontraumatic (Age 61-40y) EXAM: CT HEAD WITHOUT CONTRAST TECHNIQUE: Contiguous axial images were obtained from the base of the skull through the vertex without intravenous contrast. COMPARISON:  05/01/2018 head CT and prior. FINDINGS: Brain: No acute infarct or intracranial hemorrhage. No mass lesion. No midline shift, ventriculomegaly or extra-axial fluid collection. Vascular: No hyperdense vessel or unexpected calcification. Skull: Negative for fracture or focal lesion. Sinuses/Orbits: Normal orbits. Clear paranasal sinuses. No mastoid effusion. Other: None. IMPRESSION: No acute intracranial process. Electronically Signed   By: Primitivo Gauze M.D.   On: 02/12/2020 10:12   MR BRAIN WO CONTRAST  Result Date: 02/14/2020 CLINICAL DATA:  Seizure EXAM: MRI HEAD WITHOUT CONTRAST TECHNIQUE: Multiplanar, multiecho pulse sequences of the brain and surrounding structures were  obtained without intravenous contrast. COMPARISON:  None. FINDINGS: Brain: No acute infarct, acute hemorrhage or extra-axial collection. Normal white matter signal. Normal volume of CSF spaces. No chronic microhemorrhage. Normal midline structures. The hippocampi are normal and symmetric in size and signal. The hypothalamus and mamillary bodies are normal. There is no cortical ectopia or dysplasia. Vascular: Major flow voids are preserved. Skull and upper cervical spine: Normal calvarium and skull base. Visualized upper cervical spine and soft tissues are normal. Sinuses/Orbits:No paranasal sinus fluid levels or advanced mucosal thickening. No mastoid or middle ear effusion. Normal orbits. IMPRESSION: Normal MRI of the brain. Electronically Signed   By: Ulyses Jarred M.D.   On: 02/14/2020 00:59   DG CHEST PORT 1 VIEW  Result Date: 02/12/2020 CLINICAL DATA:  Tachycardia EXAM: PORTABLE CHEST 1 VIEW COMPARISON:  Radiograph 10/14/2019 FINDINGS: Streaky linear areas of likely subsegmental atelectasis with additional hazy basilar opacities likely reflecting further atelectatic change. More consolidated appearing opacity is seen in the retrocardiac space which could reflect additional volume loss or early airspace disease. Cardiac silhouette is likely enlarged accounting for low volumes and portable technique. The remaining cardiomediastinal contours are unremarkable. No acute osseous or soft tissue abnormality. Telemetry leads overlie the chest. IMPRESSION: 1. Low volumes and atelectasis including subsegmental bandlike atelectatic changes. 2. More coalescent opacity in the retrocardiac space which could reflect additional volume loss or early airspace disease. 3. Likely cardiomegaly accounting for low volumes and portable technique. Electronically Signed   By: Lovena Le M.D.   On: 02/12/2020 17:06   DG Abd Portable 1V  Result Date: 02/14/2020 CLINICAL DATA:  Abdominal pain EXAM: PORTABLE ABDOMEN - 1 VIEW  COMPARISON:  10/14/2019 FINDINGS: Previous cholecystectomy. Surgical clips from previous renal transplant noted in the right lower quadrant. The bowel gas pattern is normal. No radio-opaque calculi or other significant radiographic abnormality are seen. IMPRESSION: Nonobstructive bowel gas pattern. Electronically Signed   By: Kerby Moors M.D.   On: 02/14/2020 14:48   EEG adult  Result Date: 02/13/2020 Lora Havens, MD  02/14/2020  8:29 AM Patient Name: DEIDRA SPEASE MRN: [id number] Epilepsy Attending: Lora Havens Referring Physician/Provider: [name], NP Date: 02/13/2020 Duration: 26.02 mins Patient history: 34 year old woman with generalized tonic-clonic seizure activity in the setting of DKA.  EEG to evaluate for seizures. Level of alertness: Awake AEDs during EEG study: None Technical aspects: This EEG study was done with scalp electrodes positioned according to the 10-20 International system of electrode placement. Electrical activity was acquired at a sampling rate of 500Hz  and reviewed with a high frequency filter of 70Hz  and a low frequency filter of 1Hz . EEG data were recorded continuously and digitally stored. Description: No clear posterior dominant rhythm was seen.  EEG showed continuous generalized polymorphic 5 to 6 Hz theta slowing as well as intermittent generalized high amplitude 2 to 3 Hz delta slowing.  Hyperventilation and photic stimulation were not performed.   ABNORMALITY -Continuous slow, generalized -Intermittent rhythmic delta slow, generalized IMPRESSION: This study is suggestive of moderate diffuse encephalopathy, nonspecific etiology.  No seizures or epileptiform discharges were seen throughout the recording. Lora Havens   ECHOCARDIOGRAM COMPLETE  Result Date: 02/14/2020    ECHOCARDIOGRAM REPORT   Patient Name:   QUINCY PRISCO Date of Exam: 02/14/2020 Medical Rec #:  [id number]     Height:       66.0 in Accession #:    [id number]    Weight:       188.1 lb  Date of Birth:  [birth year]     BSA:          1.948 m Patient Age:    34 years      BP:           159/108 mmHg Patient Gender: F             HR:           92 bpm. Exam Location:  Inpatient Procedure: 2D Echo, Cardiac Doppler and Color Doppler Indications:    Palpitations  History:        Patient has prior history of Echocardiogram examinations, most                 recent 02/07/2019. Arrythmias:tachycardia; Risk                 Factors:Hypertension, Dyslipidemia and Diabetes. ESRD, failed                 transplant.  Sonographer:    Dustin Flock Referring Phys: [place] Comments: Patient getting sick. IMPRESSIONS  1. Limited study due to pt vomiting; only parasternal and one apical image obtained.  2. Left ventricular ejection fraction, by estimation, is 55 to 60%. The left ventricle has normal function. The left ventricle has no regional wall motion abnormalities. Left ventricular diastolic function could not be evaluated.  3. Right ventricular systolic function is normal. The right ventricular size is normal. Tricuspid regurgitation signal is inadequate for assessing PA pressure.  4. The mitral valve is normal in structure. Trivial mitral valve regurgitation. No evidence of mitral stenosis.  5. The aortic valve has an indeterminant number of cusps. Aortic valve regurgitation Not assessed. Not assessed. FINDINGS  Left Ventricle: Left ventricular ejection fraction, by estimation, is 55 to 60%. The left ventricle has normal function. The left ventricle has no regional wall motion abnormalities. The left ventricular internal cavity size was normal in size. There is  no left ventricular hypertrophy. Left ventricular diastolic function could not be evaluated. Right  Ventricle: The right ventricular size is normal.Right ventricular systolic function is normal. Tricuspid regurgitation signal is inadequate for assessing PA pressure. The tricuspid regurgitant velocity is 2.02 m/s, and with an  assumed right atrial pressure of 3 mmHg, the estimated right ventricular systolic pressure is 24.2 mmHg. Left Atrium: Left atrial size was normal in size. Right Atrium: Right atrial size was normal in size. Pericardium: Trivial pericardial effusion is present. Mitral Valve: The mitral valve is normal in structure. Trivial mitral valve regurgitation. No evidence of mitral valve stenosis. Tricuspid Valve: The tricuspid valve is normal in structure. Tricuspid valve regurgitation is mild . No evidence of tricuspid stenosis. Aortic Valve: The aortic valve has an indeterminant number of cusps. Aortic valve regurgitation Not assessed. Not assessed. Pulmonic Valve: The pulmonic valve was normal in structure. Pulmonic valve regurgitation is trivial. No evidence of pulmonic stenosis. Aorta: The aortic root is normal in size and structure. Venous: The inferior vena cava was not well visualized.  Additional Comments: Limited study due to pt vomiting; only parasternal and one apical image obtained.  LEFT VENTRICLE PLAX 2D LVIDd:         4.30 cm  Diastology LVIDs:         2.90 cm  LV e' medial:  5.22 cm/s LV PW:         1.10 cm  LV e' lateral: 5.22 cm/s LV IVS:        1.10 cm LVOT diam:     2.20 cm LVOT Area:     3.80 cm  LEFT ATRIUM         Index LA diam:    3.30 cm 1.69 cm/m   AORTA Ao Root diam: 3.00 cm TRICUSPID VALVE TR Peak grad:   16.3 mmHg TR Vmax:        202.00 cm/s  SHUNTS Systemic Diam: 2.20 cm Kirk Ruths MD Electronically signed by Kirk Ruths MD Signature Date/Time: 02/14/2020/3:21:31 PM    Final      Time coordinating discharge: Over 30 minutes    Dwyane Dee, MD  [facility] 02/15/2020, 3:59 PM

## 2020-02-15 NOTE — Progress Notes (Signed)
Inpatient Diabetes Program Recommendations  AACE/ADA: New Consensus Statement on Inpatient Glycemic Control (2015)  Target Ranges:  Prepandial:   less than 140 mg/dL      Peak postprandial:   less than 180 mg/dL (1-2 hours)      Critically ill patients:  140 - 180 mg/dL   Lab Results  Component Value Date   GLUCAP 131 (H) 02/15/2020   HGBA1C 9.2 (H) 06/17/2019    Review of Glycemic Control Results for [name], [name]" (MRN [id number]) as of 02/15/2020 11:12  Ref. Range 02/14/2020 15:44 02/14/2020 19:57 02/14/2020 23:33 02/15/2020 04:49 02/15/2020 08:29  Glucose-Capillary Latest Ref Range: 70 - 99 mg/dL 129 (H) 194 (H) 151 (H) 118 (H) 131 (H)   Diabetes history:  DM1(does not make insulin.  Needs correction, basal and meal coverage) Outpatient Diabetes medications:   Tresiba to 20 units nightly   If blood glucose less than 80, no units of Novolog  If between 81-150, inject 10 units If between 151 - 200, inject 11 units If between 201 - 250, inject 12 units If between 251 - 300, inject 13 units If between 301-350, inject 14 units If greater than 351, inject 15 units   Note:  Spoke with patient at bedside.  Verified above home medication.  Sees Endocrinologist, Dr. Donzetta Matters.  Last A1C was around 6% a few weeks ago per patient.  She normally uses a Dexcom.  She verified above home medications for diabetes.    Will continue to follow while inpatient.  Thank you, Reche Dixon, RN, BSN Diabetes Coordinator Inpatient Diabetes Program [phone] (team pager from 8a-5p)

## 2020-02-15 NOTE — Progress Notes (Signed)
[facility] Progress Note   Subjective: C/O abdominal pain, in fetal position in bed. No N& V. Primary at bedside. Discussed renal dosed Reglan.  HD today on schedule.      Objective Vitals:   02/14/20 2346 02/15/20 0334 02/15/20 0500 02/15/20 0829  BP: 121/90 107/69  105/73  Pulse: 96 91  87  Resp: 17 19  16   Temp: 99.1 F (37.3 C) 98.8 F (37.1 C)  98.4 F (36.9 C)  TempSrc: Oral Oral  Oral  SpO2: 92% (!) 89%  90%  Weight:   84.3 kg   Height:       Physical Exam:  General: Well developed, well nourished, in no acute distress. Lungs: Clear bilaterally to auscultation without wheezes, rales, or rhonchi. Breathing is unlabored. Heart: RRR with S1 S2. No murmurs, rubs, or gallops appreciated. Abdomen: Soft, tenderness upper quadrants, guarding.  Lower extremities:without edema or ischemic changes, no open wounds  Neuro: Alert and oriented X 3. Moves all extremities spontaneously. Psych: Responds to questions appropriately with a normal affect. Dialysis Access:L AVF + T/B   Additional Objective Labs: Basic Metabolic Panel: Recent Labs  Lab 02/12/20 1357 02/12/20 1711 02/13/20 0442 02/14/20 0455 02/15/20 0338  NA 139   < > 142 139 137  K 4.8   < > 4.9 5.4* 4.9  CL 89*   < > 91* 92* 95*  CO2 27   < > 29 23 23   GLUCOSE 445*   < > 197* 79 116*  BUN 58*   < > 65* 73* 45*  CREATININE 10.06*   < > 10.81* 12.62* 8.97*  CALCIUM 9.2   < > 8.5* 7.6* 8.8*  PHOS 6.2*  --  7.0*  --   --    < > = values in this interval not displayed.   Liver Function Tests: Recent Labs  Lab 02/12/20 1049 02/12/20 1357 02/15/20 0338  AST 35 27 13*  ALT 27 22 15   ALKPHOS 65 57 48  BILITOT 0.9 0.6 0.9  PROT 8.9* 8.6* 7.6  ALBUMIN 4.1 4.0 3.4*   Recent Labs  Lab 02/12/20 1049 02/12/20 1357 02/14/20 2150  LIPASE 51 46 35  AMYLASE  --  112*  --    CBC: Recent Labs  Lab 02/12/20 1148 02/12/20 1148 02/12/20 1357 02/13/20 0423 02/13/20 0442 02/14/20 0455  02/15/20 0338  WBC 7.1   < > 7.3  --  9.8 6.6 7.0  NEUTROABS 6.6   < > 6.7  --   --  3.2 4.2  HGB 12.2   < > 12.2   < > 10.4* 10.6* 11.1*  HCT 39.0   < > 38.9   < > 32.7* 34.0* 34.5*  MCV 96.8  --  97.0  --  97.6 97.4 96.4  PLT 167   < > 173  --  145* 125* 155   < > = values in this interval not displayed.   Blood Culture    Component Value Date/Time   SDES BLOOD RIGHT HAND 02/12/2020 1911   SPECREQUEST  02/12/2020 1911    BOTTLES DRAWN AEROBIC AND ANAEROBIC Blood Culture adequate volume   CULT  02/12/2020 1911    NO GROWTH 3 DAYS Performed at [facility], [place] [address], [place], [place] [address]    REPTSTATUS PENDING 02/12/2020 1911    Cardiac Enzymes: No results for input(s): CKTOTAL, CKMB, CKMBINDEX, TROPONINI in the last 168 hours. CBG: Recent Labs  Lab 02/14/20 1544 02/14/20 1957 02/14/20  2333 02/15/20 0449 02/15/20 0829  GLUCAP 129* 194* 151* 118* 131*   Iron Studies: No results for input(s): IRON, TIBC, TRANSFERRIN, FERRITIN in the last 72 hours. @lablastinr3 @ Studies/Results: MR BRAIN WO CONTRAST  Result Date: 02/14/2020 CLINICAL DATA:  Seizure EXAM: MRI HEAD WITHOUT CONTRAST TECHNIQUE: Multiplanar, multiecho pulse sequences of the brain and surrounding structures were obtained without intravenous contrast. COMPARISON:  None. FINDINGS: Brain: No acute infarct, acute hemorrhage or extra-axial collection. Normal white matter signal. Normal volume of CSF spaces. No chronic microhemorrhage. Normal midline structures. The hippocampi are normal and symmetric in size and signal. The hypothalamus and mamillary bodies are normal. There is no cortical ectopia or dysplasia. Vascular: Major flow voids are preserved. Skull and upper cervical spine: Normal calvarium and skull base. Visualized upper cervical spine and soft tissues are normal. Sinuses/Orbits:No paranasal sinus fluid levels or advanced mucosal thickening. No mastoid or middle ear effusion. Normal orbits.  IMPRESSION: Normal MRI of the brain. Electronically Signed   By: Ulyses Jarred M.D.   On: 02/14/2020 00:59   DG Abd Portable 1V  Result Date: 02/14/2020 CLINICAL DATA:  Abdominal pain EXAM: PORTABLE ABDOMEN - 1 VIEW COMPARISON:  10/14/2019 FINDINGS: Previous cholecystectomy. Surgical clips from previous renal transplant noted in the right lower quadrant. The bowel gas pattern is normal. No radio-opaque calculi or other significant radiographic abnormality are seen. IMPRESSION: Nonobstructive bowel gas pattern. Electronically Signed   By: Kerby Moors M.D.   On: 02/14/2020 14:48   EEG adult  Result Date: 02/13/2020 Lora Havens, MD     02/14/2020  8:29 AM Patient Name: [name] MRN: [id number] Epilepsy Attending: Lora Havens Referring Physician/Provider: [name], NP Date: 02/13/2020 Duration: 26.02 mins Patient history: 34 year old woman with generalized tonic-clonic seizure activity in the setting of DKA.  EEG to evaluate for seizures. Level of alertness: Awake AEDs during EEG study: None Technical aspects: This EEG study was done with scalp electrodes positioned according to the 10-20 International system of electrode placement. Electrical activity was acquired at a sampling rate of 500Hz  and reviewed with a high frequency filter of 70Hz  and a low frequency filter of 1Hz . EEG data were recorded continuously and digitally stored. Description: No clear posterior dominant rhythm was seen.  EEG showed continuous generalized polymorphic 5 to 6 Hz theta slowing as well as intermittent generalized high amplitude 2 to 3 Hz delta slowing.  Hyperventilation and photic stimulation were not performed.   ABNORMALITY -Continuous slow, generalized -Intermittent rhythmic delta slow, generalized IMPRESSION: This study is suggestive of moderate diffuse encephalopathy, nonspecific etiology.  No seizures or epileptiform discharges were seen throughout the recording. Lora Havens   ECHOCARDIOGRAM  COMPLETE  Result Date: 02/14/2020    ECHOCARDIOGRAM REPORT   Patient Name:   JAKAI ONOFRE Date of Exam: 02/14/2020 Medical Rec #:  [id number]     Height:       66.0 in Accession #:    [id number]    Weight:       188.1 lb Date of Birth:  [birth year]     BSA:          1.948 m Patient Age:    34 years      BP:           159/108 mmHg Patient Gender: F             HR:           92 bpm. Exam Location:  Inpatient Procedure: 2D Echo, Cardiac  Doppler and Color Doppler Indications:    Palpitations  History:        Patient has prior history of Echocardiogram examinations, most                 recent 02/07/2019. Arrythmias:tachycardia; Risk                 Factors:Hypertension, Dyslipidemia and Diabetes. ESRD, failed                 transplant.  Sonographer:    Dustin Flock Referring Phys: [place] Comments: Patient getting sick. IMPRESSIONS  1. Limited study due to pt vomiting; only parasternal and one apical image obtained.  2. Left ventricular ejection fraction, by estimation, is 55 to 60%. The left ventricle has normal function. The left ventricle has no regional wall motion abnormalities. Left ventricular diastolic function could not be evaluated.  3. Right ventricular systolic function is normal. The right ventricular size is normal. Tricuspid regurgitation signal is inadequate for assessing PA pressure.  4. The mitral valve is normal in structure. Trivial mitral valve regurgitation. No evidence of mitral stenosis.  5. The aortic valve has an indeterminant number of cusps. Aortic valve regurgitation Not assessed. Not assessed. FINDINGS  Left Ventricle: Left ventricular ejection fraction, by estimation, is 55 to 60%. The left ventricle has normal function. The left ventricle has no regional wall motion abnormalities. The left ventricular internal cavity size was normal in size. There is  no left ventricular hypertrophy. Left ventricular diastolic function could not be evaluated. Right Ventricle:  The right ventricular size is normal.Right ventricular systolic function is normal. Tricuspid regurgitation signal is inadequate for assessing PA pressure. The tricuspid regurgitant velocity is 2.02 m/s, and with an assumed right atrial pressure of 3 mmHg, the estimated right ventricular systolic pressure is 57.3 mmHg. Left Atrium: Left atrial size was normal in size. Right Atrium: Right atrial size was normal in size. Pericardium: Trivial pericardial effusion is present. Mitral Valve: The mitral valve is normal in structure. Trivial mitral valve regurgitation. No evidence of mitral valve stenosis. Tricuspid Valve: The tricuspid valve is normal in structure. Tricuspid valve regurgitation is mild . No evidence of tricuspid stenosis. Aortic Valve: The aortic valve has an indeterminant number of cusps. Aortic valve regurgitation Not assessed. Not assessed. Pulmonic Valve: The pulmonic valve was normal in structure. Pulmonic valve regurgitation is trivial. No evidence of pulmonic stenosis. Aorta: The aortic root is normal in size and structure. Venous: The inferior vena cava was not well visualized.  Additional Comments: Limited study due to pt vomiting; only parasternal and one apical image obtained.  LEFT VENTRICLE PLAX 2D LVIDd:         4.30 cm  Diastology LVIDs:         2.90 cm  LV e' medial:  5.22 cm/s LV PW:         1.10 cm  LV e' lateral: 5.22 cm/s LV IVS:        1.10 cm LVOT diam:     2.20 cm LVOT Area:     3.80 cm  LEFT ATRIUM         Index LA diam:    3.30 cm 1.69 cm/m   AORTA Ao Root diam: 3.00 cm TRICUSPID VALVE TR Peak grad:   16.3 mmHg TR Vmax:        202.00 cm/s  SHUNTS Systemic Diam: 2.20 cm Kirk Ruths MD Electronically signed by Kirk Ruths MD Signature Date/Time: 02/14/2020/3:21:31  PM    Final    Medications:  . amLODipine  5 mg Oral Daily  . calcium acetate  1,334 mg Oral TID WC  . heparin  5,000 Units Subcutaneous Q8H  . heparin sodium (porcine)  2,000 Units Intravenous Once  .  insulin aspart  1-3 Units Subcutaneous Q4H  . insulin detemir  8 Units Subcutaneous Q12H  . LORazepam  0.5 mg Intravenous Once  . metoprolol tartrate  25 mg Oral BID  . pantoprazole  40 mg Oral QHS  . polyethylene glycol  17 g Oral Daily  . senna-docusate  1 tablet Oral BID     Dialysis Orders:[facility] MWF 4 hrs 180NRe 350/800 86 kg 2.0K/3.5 Ca UFP 4 AVF -No Heparin -No VDRA/ESA -Venofer 50 mg IV weekly  Assessment/Plan: 1. Seizure activity-H/Opseudoseizures. Work up per [employer], neurology. CT/MRI of head unremarkable. EEG with moderate diffuse encephalopathy. No further seizure activity. Neuro has signed off.   2. New onset of abdominal pain. Possible flair of gastroparesis. Start renal dosed Reglan. Disc with primary. ALT/AST OK.  3. SVT on admission. Initially on amiodarone, this has since been Dc'd. Seen by Cardiology. No further runs of SVT at this time.Plan for 30 day event monitor. Continue BB.  4. ESRD - MWF-HD today to get back on schedule. K+ 4.9.   5. Hypertension/volume - BP controlled at present, no evidence of volume overload by exam. She is under OP EDW. Challenging EDW, lower EDW on discharge.  6. Anemia - HGB 11.1 No ESA needed. 7. Metabolic bone disease - Ca OK, PO4 elevated. Continue binders. 8. Nutrition - Renal/Carb mod diet. Alb at goal. Renal vit, nepro 9. Uncontrolled DM-per primary 10. Failed renaltransplant patient  Disposition: Home today if abdominal pain controlled.  Alaena Strader H. Jakeline Dave NP-C 02/15/2020, 10:56 AM  [employer] [phone]

## 2020-02-15 NOTE — Progress Notes (Signed)
Chart reviewed, patient not seen. Limited echo due to patient factors was without abnormality. No further cardiovascular testing required inpatient. Continue metoprolol, if further BP control needed, can consider transitioning to carvedilol, can be assessed as outpatient. Plan for 30 day event monitor. Follow up has been arranged. Cardiology will sign off at this time. Please call on call cardiology with any questions prior to dismissal.

## 2020-02-16 ENCOUNTER — Telehealth: Payer: Self-pay | Admitting: Physician Assistant

## 2020-02-16 ENCOUNTER — Other Ambulatory Visit: Payer: Self-pay

## 2020-02-16 ENCOUNTER — Emergency Department (HOSPITAL_COMMUNITY)
Admission: EM | Admit: 2020-02-16 | Discharge: 2020-02-16 | Disposition: A | Discharge status: Home / Self Care | Payer: Medicare Other | Attending: Emergency Medicine | Admitting: Emergency Medicine

## 2020-02-16 ENCOUNTER — Encounter (HOSPITAL_COMMUNITY): Payer: Self-pay

## 2020-02-16 DIAGNOSIS — N186 End stage renal disease: Secondary | ICD-10-CM | POA: Diagnosis not present

## 2020-02-16 DIAGNOSIS — R109 Unspecified abdominal pain: Secondary | ICD-10-CM | POA: Diagnosis present

## 2020-02-16 DIAGNOSIS — Z5321 Procedure and treatment not carried out due to patient leaving prior to being seen by health care provider: Secondary | ICD-10-CM | POA: Diagnosis not present

## 2020-02-16 DIAGNOSIS — R112 Nausea with vomiting, unspecified: Secondary | ICD-10-CM | POA: Diagnosis not present

## 2020-02-16 DIAGNOSIS — R197 Diarrhea, unspecified: Secondary | ICD-10-CM | POA: Insufficient documentation

## 2020-02-16 DIAGNOSIS — Z87738 Personal history of other specified (corrected) congenital malformations of digestive system: Secondary | ICD-10-CM | POA: Insufficient documentation

## 2020-02-16 DIAGNOSIS — Z992 Dependence on renal dialysis: Secondary | ICD-10-CM | POA: Diagnosis not present

## 2020-02-16 NOTE — Telephone Encounter (Signed)
Transition of care contact from inpatient facility  Date of Discharge: 02/15/20 Date of Contact: 02/16/20 Method of contact: Phone  Attempted to contact patient to discuss transition of care from inpatient admission. Patient did not answer the phone. Patient's family member answered the phone and reported he just took her back to [facility] because she was not feeling well. Will follow ED notes.  Anice Paganini, PA-C 02/16/2020, 1:49 PM  [employer]

## 2020-02-16 NOTE — ED Triage Notes (Signed)
Pt presents with c/o abdominal pain for the past 2 days. Pt reports that she has some N/V/D with the pain. Pt reports she has a hx of gastroparesis. Pt reports she did have dialysis today which made the pain worse.

## 2020-02-16 NOTE — ED Notes (Signed)
I attempted to collect labs and was unsuccessful. 

## 2020-02-17 ENCOUNTER — Emergency Department (HOSPITAL_COMMUNITY)
Admission: EM | Admit: 2020-02-17 | Discharge: 2020-02-17 | Disposition: A | Discharge status: Home / Self Care | Payer: Medicare Other | Attending: Emergency Medicine | Admitting: Emergency Medicine

## 2020-02-17 DIAGNOSIS — E1022 Type 1 diabetes mellitus with diabetic chronic kidney disease: Secondary | ICD-10-CM | POA: Diagnosis not present

## 2020-02-17 DIAGNOSIS — I120 Hypertensive chronic kidney disease with stage 5 chronic kidney disease or end stage renal disease: Secondary | ICD-10-CM | POA: Insufficient documentation

## 2020-02-17 DIAGNOSIS — N186 End stage renal disease: Secondary | ICD-10-CM | POA: Diagnosis not present

## 2020-02-17 DIAGNOSIS — E1010 Type 1 diabetes mellitus with ketoacidosis without coma: Secondary | ICD-10-CM | POA: Insufficient documentation

## 2020-02-17 DIAGNOSIS — G8929 Other chronic pain: Secondary | ICD-10-CM | POA: Insufficient documentation

## 2020-02-17 DIAGNOSIS — Z79899 Other long term (current) drug therapy: Secondary | ICD-10-CM | POA: Insufficient documentation

## 2020-02-17 DIAGNOSIS — Z992 Dependence on renal dialysis: Secondary | ICD-10-CM | POA: Insufficient documentation

## 2020-02-17 DIAGNOSIS — R109 Unspecified abdominal pain: Secondary | ICD-10-CM | POA: Diagnosis not present

## 2020-02-17 DIAGNOSIS — Z794 Long term (current) use of insulin: Secondary | ICD-10-CM | POA: Insufficient documentation

## 2020-02-17 DIAGNOSIS — R112 Nausea with vomiting, unspecified: Secondary | ICD-10-CM | POA: Insufficient documentation

## 2020-02-17 DIAGNOSIS — E1065 Type 1 diabetes mellitus with hyperglycemia: Secondary | ICD-10-CM | POA: Diagnosis not present

## 2020-02-17 LAB — I-STAT VENOUS BLOOD GAS, ED
Acid-Base Excess: 3 mmol/L — ABNORMAL HIGH (ref 0.0–2.0)
Bicarbonate: 27 mmol/L (ref 20.0–28.0)
Calcium, Ion: 0.96 mmol/L — ABNORMAL LOW (ref 1.15–1.40)
HCT: 31 % — ABNORMAL LOW (ref 36.0–46.0)
Hemoglobin: 10.5 g/dL — ABNORMAL LOW (ref 12.0–15.0)
O2 Saturation: 99 %
Potassium: 4.3 mmol/L (ref 3.5–5.1)
Sodium: 136 mmol/L (ref 135–145)
TCO2: 28 mmol/L (ref 22–32)
pCO2, Ven: 36.4 mmHg — ABNORMAL LOW (ref 44.0–60.0)
pH, Ven: 7.478 — ABNORMAL HIGH (ref 7.250–7.430)
pO2, Ven: 143 mmHg — ABNORMAL HIGH (ref 32.0–45.0)

## 2020-02-17 LAB — CBC WITH DIFFERENTIAL/PLATELET
Abs Immature Granulocytes: 0.01 10*3/uL (ref 0.00–0.07)
Basophils Absolute: 0 10*3/uL (ref 0.0–0.1)
Basophils Relative: 1 %
Eosinophils Absolute: 0.2 10*3/uL (ref 0.0–0.5)
Eosinophils Relative: 3 %
HCT: 33.6 % — ABNORMAL LOW (ref 36.0–46.0)
Hemoglobin: 10.4 g/dL — ABNORMAL LOW (ref 12.0–15.0)
Immature Granulocytes: 0 %
Lymphocytes Relative: 31 %
Lymphs Abs: 1.6 10*3/uL (ref 0.7–4.0)
MCH: 30 pg (ref 26.0–34.0)
MCHC: 31 g/dL (ref 30.0–36.0)
MCV: 96.8 fL (ref 80.0–100.0)
Monocytes Absolute: 0.4 10*3/uL (ref 0.1–1.0)
Monocytes Relative: 8 %
Neutro Abs: 3.1 10*3/uL (ref 1.7–7.7)
Neutrophils Relative %: 57 %
Platelets: 148 10*3/uL — ABNORMAL LOW (ref 150–400)
RBC: 3.47 MIL/uL — ABNORMAL LOW (ref 3.87–5.11)
RDW: 14.8 % (ref 11.5–15.5)
WBC: 5.4 10*3/uL (ref 4.0–10.5)
nRBC: 0 % (ref 0.0–0.2)

## 2020-02-17 LAB — COMPREHENSIVE METABOLIC PANEL
ALT: 16 U/L (ref 0–44)
AST: 18 U/L (ref 15–41)
Albumin: 3.8 g/dL (ref 3.5–5.0)
Alkaline Phosphatase: 43 U/L (ref 38–126)
Anion gap: 17 — ABNORMAL HIGH (ref 5–15)
BUN: 43 mg/dL — ABNORMAL HIGH (ref 6–20)
CO2: 23 mmol/L (ref 22–32)
Calcium: 8.7 mg/dL — ABNORMAL LOW (ref 8.9–10.3)
Chloride: 95 mmol/L — ABNORMAL LOW (ref 98–111)
Creatinine, Ser: 8.71 mg/dL — ABNORMAL HIGH (ref 0.44–1.00)
GFR, Estimated: 6 mL/min — ABNORMAL LOW (ref >60)
Glucose, Bld: 157 mg/dL — ABNORMAL HIGH (ref 70–99)
Potassium: 4.6 mmol/L (ref 3.5–5.1)
Sodium: 135 mmol/L (ref 135–145)
Total Bilirubin: 1.2 mg/dL (ref 0.3–1.2)
Total Protein: 7.6 g/dL (ref 6.5–8.1)

## 2020-02-17 LAB — CULTURE, BLOOD (ROUTINE X 2)
Culture: NO GROWTH
Culture: NO GROWTH
Special Requests: ADEQUATE
Special Requests: ADEQUATE

## 2020-02-17 LAB — LACTIC ACID, PLASMA: Lactic Acid, Venous: 1 mmol/L (ref 0.5–1.9)

## 2020-02-17 LAB — CBG MONITORING, ED: Glucose-Capillary: 174 mg/dL — ABNORMAL HIGH (ref 70–99)

## 2020-02-17 LAB — LIPASE, BLOOD: Lipase: 52 U/L — ABNORMAL HIGH (ref 11–51)

## 2020-02-17 MED ORDER — HYOSCYAMINE SULFATE SL 0.125 MG SL SUBL: 1.0000 | SUBLINGUAL_TABLET | Freq: Four times a day (QID) | SUBLINGUAL | 0 refills | Status: DC | PRN

## 2020-02-17 MED ORDER — METOCLOPRAMIDE HCL 5 MG/ML IJ SOLN
10.0000 mg | Freq: Once | INTRAMUSCULAR | Status: AC
  Administered 2020-02-17: 10 mg via INTRAVENOUS
  Filled 2020-02-17: qty 2

## 2020-02-17 MED ORDER — ALUM & MAG HYDROXIDE-SIMETH 200-200-20 MG/5ML PO SUSP
30.0000 mL | Freq: Once | ORAL | Status: AC
  Administered 2020-02-17: 30 mL via ORAL
  Filled 2020-02-17: qty 30

## 2020-02-17 MED ORDER — HYOSCYAMINE SULFATE 0.125 MG SL SUBL
0.2500 mg | SUBLINGUAL_TABLET | Freq: Once | SUBLINGUAL | Status: AC
  Administered 2020-02-17: 0.25 mg via SUBLINGUAL
  Filled 2020-02-17: qty 2

## 2020-02-17 MED ORDER — LIDOCAINE VISCOUS HCL 2 % MT SOLN: 15.0000 mL | Freq: Four times a day (QID) | OROMUCOSAL | 0 refills | Status: DC | PRN

## 2020-02-17 MED ORDER — LIDOCAINE VISCOUS HCL 2 % MT SOLN
15.0000 mL | Freq: Once | OROMUCOSAL | Status: AC
  Administered 2020-02-17: 15 mL via ORAL
  Filled 2020-02-17: qty 15

## 2020-02-17 NOTE — ED Triage Notes (Signed)
Pt arrived by EMS c/o severe abdominal pain that has been getting worse since dialysis Saturday morning, rates pain 10/10. States it is a sharp pain on lower right side.   States she was recently admitted for new seizures, they did not find out reason for seizures.   Pt is moaning and crying in pain. States she took Reglan at home without relief.  Feels nauseous and has had diarrhea

## 2020-02-17 NOTE — ED Provider Notes (Signed)
[facility] EMERGENCY DEPARTMENT Provider Note  CSN: [id number] Arrival date & time: 02/17/20 0310  Chief Complaint(s) Abdominal Pain  HPI [name] is a 34 y.o. female with a past medical history listed below including diabetes and chronic gastritis who presents to the emergency department with exacerbation of her gastritis which she reports began last night.  Reports that she was just discharged from the hospital after requiring hemodialysis.  On record review there was notes from the admitting providers documenting abdominal discomfort.   She reports nausea and a few bouts of nonbloody nonbilious emesis.  She also reports that she has had "a bit of diarrhea.  Denies any shortness of breath.  No chest pain.  No urinary symptoms.  Denies any other physical complaints.  HPI  Past Medical History Past Medical History:  Diagnosis Date  . Anemia   . Chronic kidney disease    kidney transplant 07  . Diabetes mellitus    Pt reports diagnosis in June 2011, Type 2  . GERD (gastroesophageal reflux disease)   . Hyperlipidemia   . Hypertension   . 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 Pyelonephritis 06/23/2014  . Seasonal allergies   . UTI (urinary tract infection) 01/09/2015  . 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   Qualifier: Diagnosis of  By: Carlena Sax  MD, Colletta [place]     Patient Active Problem List   Diagnosis Date Noted  . Wide-complex tachycardia ([place])   . Hypotension 10/16/2019  . Pain in the abdomen 10/14/2019  . Hyperkalemia 10/11/2019  . Hypertensive urgency 10/11/2019  . Hyperglycemia 10/11/2019  . Intractable abdominal pain 06/19/2019  . Sinus tachycardia 06/19/2019  . Intra-abdominal abscess ([place]) 10/28/2018  . Hypertensive emergency 09/01/2018  . DKA (diabetic ketoacidosis) ([place]) 05/01/2018  . ESRD on hemodialysis ([place]) 05/01/2018    . Renal failure 03/18/2018  . Cellulitis of left leg 03/01/2018  . Hyperlipidemia 03/01/2018  . Hypocalcemia 03/01/2018  . Metabolic acidosis 09/32/6712  . Hypokalemia 02/03/2018  . Incontinence of bowel 02/03/2018  . Chronic pain 11/11/2017  . Diabetic gastroparesis ([place])   . Constipation 07/13/2017  . Hematemesis 07/02/2017  . Dehydration 07/02/2017  . Chronic cholecystitis 06/29/2017  . Type 1 diabetes mellitus with complication, uncontrolled ([place]) 05/25/2015  . Intractable nausea and vomiting 01/09/2015  . Renal transplant recipient   . Immunosuppressed status ([place])   . Chest pain 12/22/2012  . 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 Chronic kidney disease 01/04/2010  . Chronic kidney disease, stage II (mild) 01/04/2010  . OVARIAN FAILURE, PREMATURE 03/11/2009  . 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 OBESITY 09/25/2007  . Anemia due to chronic kidney disease 09/25/2007  . LEARNING DISABILITY 09/25/2007  . Essential hypertension, benign 09/25/2007   Home Medication(s) Prior to Admission medications   Medication Sig Start Date End Date Taking? Authorizing Provider  ACCU-CHEK SOFTCLIX LANCETS lancets Use to check blood sugar 4 times per day. 12/29/15   Renato Shin, MD  acetaminophen (TYLENOL) 500 MG tablet Take 1,000 mg by mouth every 6 (six) hours as needed for headache (pain).     [provider]  amLODipine (NORVASC) 5 MG tablet Take 1 tablet (5 mg total) by mouth daily. 02/16/20   Dwyane Dee, MD  calcium acetate (PHOSLO) 667 MG capsule Take [phone]  mg by mouth See admin instructions. Take 2 capsules (1334 mg) by mouth up to 4 times daily with meals, take 1 capsule (667 mg) with snacks 11/24/18   [provider]  Calcium Carbonate Antacid (CALCIUM CARBONATE, DOSED IN MG ELEMENTAL CALCIUM,) 1250 MG/5ML SUSP Take 5 mLs by mouth 3 (three) times  daily.  11/17/18   [provider]  cetirizine (ZYRTEC) 10 MG tablet Take 10 mg by mouth daily as needed for allergies.    [provider]  D 1000 25 MCG (1000 UT) capsule Take 1,000 Units by mouth every other day.  04/27/19   [provider]  diclofenac Sodium (VOLTAREN) 1 % GEL Apply 2 g topically 4 (four) times daily.    [provider]  fluticasone (FLONASE) 50 MCG/ACT nasal spray Place 2 sprays into both nostrils daily as needed for allergies. 09/03/18   [name], MD  gabapentin (NEURONTIN) 400 MG capsule Take 400 mg by mouth 3 (three) times daily. 04/29/19   [provider]  hydrOXYzine (ATARAX/VISTARIL) 50 MG tablet Take 1 tablet (50 mg total) by mouth 2 (two) times daily as needed for itching. 02/06/18   [name], [name], MD  Hyoscyamine Sulfate SL (LEVSIN/SL) 0.125 MG SUBL Place 1 each under the tongue 4 (four) times daily as needed for up to 5 days. 02/17/20 02/22/20  Fatima Blank, MD  insulin aspart (NOVOLOG FLEXPEN) 100 UNIT/ML FlexPen Inject 2-10 Units into the skin 3 (three) times daily with meals. as needed for blood sugar management (sliding scale)    [provider]  insulin degludec (TRESIBA) 100 UNIT/ML FlexTouch Pen Inject 20 Units into the skin daily. 02/15/20   Dwyane Dee, MD  lidocaine (XYLOCAINE) 2 % solution Use as directed 15 mLs in the mouth or throat every 6 (six) hours as needed for mouth pain. 02/17/20   Fatima Blank, MD  lidocaine-prilocaine (EMLA) cream Apply 1 application topically See admin instructions. Apply topically one hour prior to dialysis on Monday, Wednesday, Friday 09/24/19   [provider]  loperamide (IMODIUM) 1 MG/5ML solution Take 2 mg by mouth 2 (two) times daily as needed for diarrhea or loose stools.     [provider]  Methoxy PEG-Epoetin Beta (MIRCERA IJ) Mircera 07/23/19 07/21/20  [provider]  metoCLOPramide (REGLAN) 5 MG tablet Take 1 tablet (5  mg total) by mouth 3 (three) times daily before meals. 02/15/20   Dwyane Dee, MD  metoprolol tartrate (LOPRESSOR) 25 MG tablet Take 1 tablet (25 mg total) by mouth 2 (two) times daily. 02/15/20   Dwyane Dee, MD  pantoprazole (PROTONIX) 40 MG tablet Take 40 mg by mouth 2 (two) times daily.     [provider]  promethazine (PHENERGAN) 12.5 MG suppository Place 12.5 mg rectally 5 (five) times daily as needed for nausea or vomiting.     [provider]  promethazine (PHENERGAN) 25 MG tablet Take 25 mg by mouth every 6 (six) hours as needed for nausea or vomiting.  10/13/19   [provider]  simvastatin (ZOCOR) 20 MG tablet Take 20 mg by mouth at bedtime.     [provider]  Past Surgical History Past Surgical History:  Procedure Laterality Date  . ARTERIOVENOUS GRAFT PLACEMENT Bilateral    "neither work" (10/24/2017)  . AV FISTULA PLACEMENT Left 10/26/2018   Procedure: CREATION OF ARTERIOVENOUS FISTULA  LEFT ARM;  Surgeon: [name], MD;  Location: [facility];  Service: Vascular;  Laterality: Left;  . BASCILIC VEIN TRANSPOSITION Left 12/21/2018   Procedure: Left arm BASILIC VEIN TRANSPOSITION SECOND STAGE;  Surgeon: [name], MD;  Location: [facility];  Service: Vascular;  Laterality: Left;  . CHOLECYSTECTOMY N/A 06/30/2017   Procedure: LAPAROSCOPIC CHOLECYSTECTOMY WITH INTRAOPERATIVE CHOLANGIOGRAM;  Surgeon: Excell Seltzer, MD;  Location: [facility];  Service: General;  Laterality: N/A;  . ESOPHAGOGASTRODUODENOSCOPY (EGD) WITH PROPOFOL N/A 07/04/2017   Procedure: ESOPHAGOGASTRODUODENOSCOPY (EGD) WITH PROPOFOL;  Surgeon: Clarene Essex, MD;  Location: [facility];  Service: Endoscopy;  Laterality: N/A;  . INSERTION OF DIALYSIS CATHETER N/A 03/20/2018   Procedure: INSERTION OF TUNNELED DIALYSIS CATHETER - RIGHT INTERANL  JUGULAR PLACEMENT;  Surgeon: Angelia Mould, MD;  Location: [facility];  Service: Vascular;  Laterality: N/A;  . IR GUIDED [place]  10/28/2018  . KIDNEY TRANSPLANT  2007  . PARATHYROIDECTOMY  ?2012   "3/4 removed" (10/24/2017)  . RENAL BIOPSY Bilateral 2003  . UPPER EXTREMITY VENOGRAPHY Bilateral 10/19/2018   Procedure: UPPER EXTREMITY VENOGRAPHY;  Surgeon: [name], MD;  Location: [facility];  Service: Cardiovascular;  Laterality: Bilateral;  Bilateral    Family History Family History  Problem Relation Age of Onset  . Arthritis Mother   . Hypertension Mother   . Aneurysm Mother        died of brain aneurysm  . CAD Father        Has 3 stents  . Diabetes Father        borderline  . Early death Brother        Died in war    Social History Social History   Tobacco Use  . Smoking status: Never Smoker  . Smokeless tobacco: Never Used  Vaping Use  . Vaping Use: Never used  Substance Use Topics  . Alcohol use: No  . Drug use: No   Allergies Benadryl [diphenhydramine-zinc acetate], Diphenhydramine, Doxycycline, Motrin [ibuprofen], Shellfish-derived products, Shellfish allergy, Banana, Chlorhexidine, Ferrous sulfate, and Iron dextran  Review of Systems Review of Systems All other systems are reviewed and are negative for acute change except as noted in the HPI  Physical Exam Vital Signs  I have reviewed the triage vital signs BP (!) 172/111 (BP Location: Right Arm)   Pulse 99   Temp 98.6 F (37 C) (Oral)   Resp (!) 22   SpO2 98%   Physical Exam Vitals reviewed.  Constitutional:      General: She is not in acute distress.    Appearance: She is well-developed. She is not diaphoretic.  HENT:     Head: Normocephalic and atraumatic.     Nose: Nose normal.  Eyes:     General: No scleral icterus.       Right eye: No discharge.        Left eye: No discharge.     Conjunctiva/sclera: Conjunctivae normal.     Pupils: Pupils are  equal, round, and reactive to light.  Cardiovascular:     Rate and Rhythm: Normal rate and regular rhythm.     Heart sounds: No murmur heard.  No friction rub. No gallop.   Pulmonary:     Effort: Pulmonary effort is  normal. No respiratory distress.     Breath sounds: Normal breath sounds. No stridor. No rales.  Abdominal:     General: There is no distension.     Palpations: Abdomen is soft.     Tenderness: There is abdominal tenderness in the left upper quadrant. There is no guarding or rebound.  Musculoskeletal:        General: No tenderness.     Cervical back: Normal range of motion and neck supple.  Skin:    General: Skin is warm and dry.     Findings: No erythema or rash.  Neurological:     Mental Status: She is alert and oriented to person, place, and time.     ED Results and Treatments Labs (all labs ordered are listed, but only abnormal results are displayed) Labs Reviewed  COMPREHENSIVE METABOLIC PANEL - Abnormal; Notable for the following components:      Result Value   Chloride 95 (*)    Glucose, Bld 157 (*)    BUN 43 (*)    Creatinine, Ser 8.71 (*)    Calcium 8.7 (*)    GFR, Estimated 6 (*)    Anion gap 17 (*)    All other components within normal limits  LIPASE, BLOOD - Abnormal; Notable for the following components:   Lipase 52 (*)    All other components within normal limits  CBC WITH DIFFERENTIAL/PLATELET - Abnormal; Notable for the following components:   RBC 3.47 (*)    Hemoglobin 10.4 (*)    HCT 33.6 (*)    Platelets 148 (*)    All other components within normal limits  I-STAT VENOUS BLOOD GAS, ED - Abnormal; Notable for the following components:   pH, Ven 7.478 (*)    pCO2, Ven 36.4 (*)    pO2, Ven 143.0 (*)    Acid-Base Excess 3.0 (*)    Calcium, Ion 0.96 (*)    HCT 31.0 (*)    Hemoglobin 10.5 (*)    All other components within normal limits  CBG MONITORING, ED - Abnormal; Notable for the following components:   Glucose-Capillary 174 (*)     All other components within normal limits  LACTIC ACID, PLASMA  CBC WITH DIFFERENTIAL/PLATELET                                                                                                                         EKG  EKG Interpretation  Date/Time:  Sunday February 17 2020 03:36:39 EST Ventricular Rate:  95 PR Interval:    QRS Duration: 88 QT Interval:  365 QTC Calculation: 459 R Axis:   88 Text Interpretation: Sinus rhythm No acute changes Confirmed by Addison Lank [phone]) on 02/17/2020 4:38:56 AM      Radiology No results found.  Pertinent labs & imaging results that were available during my care of the patient were reviewed by me and considered in my medical decision making (see chart for details).  Medications Ordered in ED Medications  alum & mag hydroxide-simeth (MAALOX/MYLANTA) 200-200-20 MG/5ML suspension 30 mL (30 mLs Oral Given 02/17/20 0410)    And  lidocaine (XYLOCAINE) 2 % viscous mouth solution 15 mL (15 mLs Oral Given 02/17/20 0410)  hyoscyamine (LEVSIN SL) SL tablet 0.25 mg (0.25 mg Sublingual Given 02/17/20 0410)  metoCLOPramide (REGLAN) injection 10 mg (10 mg Intravenous Given 02/17/20 0409)                                                                                                                                    Procedures Procedures  (including critical care time)  Medical Decision Making / ED Course I have reviewed the nursing notes for this encounter and the patient's prior records (if available in EHR or on provided paperwork).   TERRIN IMPARATO was evaluated in Emergency Department on 02/17/2020 for the symptoms described in the history of present illness. She was evaluated in the context of the global COVID-19 pandemic, which necessitated consideration that the patient might be at risk for infection with the SARS-CoV-2 virus that causes COVID-19. Institutional protocols and algorithms that pertain to the evaluation of patients at risk for COVID-19  are in a state of rapid change based on information released by regulatory bodies including the CDC and federal and state organizations. These policies and algorithms were followed during the patient's care in the ED.  Exacerbation of chronic abdominal pain Labs reassuring without leukocytosis.  Stable hemoglobin.  Patient has baseline renal function.  No significant electrolyte derangements.  Mildly elevated lipase but no above 3 times upper limits of normal.   No evidence of DKA. Normal lactic acid.  Patient treated symptomatically with IV Reglan, GI cocktail, which significantly improved her symptoms.  On multiple occasions, patient was found sleeping comfortably in bed. Able to tolerate GI cocktail.       Final Clinical Impression(s) / ED Diagnoses Final diagnoses:  Chronic abdominal pain    The patient appears reasonably screened and/or stabilized for discharge and I doubt any other medical condition or other [facility] requiring further screening, evaluation, or treatment in the ED at this time prior to discharge. Safe for discharge with strict return precautions.  Disposition: Discharge  Condition: Good  I have discussed the results, Dx and Tx plan with the patient/family who expressed understanding and agree(s) with the plan. Discharge instructions discussed at length. The patient/family was given strict return precautions who verbalized understanding of the instructions. No further questions at time of discharge.    ED Discharge Orders         Ordered    Hyoscyamine Sulfate SL (LEVSIN/SL) 0.125 MG SUBL  4 times daily PRN        02/17/20 0720    lidocaine (XYLOCAINE) 2 % solution  Every 6 hours PRN        02/17/20 0720          Follow Up: [name], MD [place]  [place] [address] [phone]  Call  To schedule an appointment for close follow up     This chart was dictated using voice recognition software.  Despite best efforts to proofread,   errors can occur which can change the documentation meaning.   Fatima Blank, MD 02/17/20 [phone]

## 2020-02-21 ENCOUNTER — Emergency Department (HOSPITAL_COMMUNITY)
Admission: EM | Admit: 2020-02-21 | Discharge: 2020-02-21 | Discharge status: Against Medical Advice | Payer: Medicare Other | Attending: Emergency Medicine | Admitting: Emergency Medicine

## 2020-02-21 ENCOUNTER — Other Ambulatory Visit: Payer: Self-pay

## 2020-02-21 LAB — COMPREHENSIVE METABOLIC PANEL
ALT: 15 U/L (ref 0–44)
AST: 17 U/L (ref 15–41)
Albumin: 3.5 g/dL (ref 3.5–5.0)
Alkaline Phosphatase: 38 U/L (ref 38–126)
Anion gap: 15 (ref 5–15)
BUN: 15 mg/dL (ref 6–20)
CO2: 25 mmol/L (ref 22–32)
Calcium: 9.8 mg/dL (ref 8.9–10.3)
Chloride: 96 mmol/L — ABNORMAL LOW (ref 98–111)
Creatinine, Ser: 5.17 mg/dL — ABNORMAL HIGH (ref 0.44–1.00)
GFR, Estimated: 10 mL/min — ABNORMAL LOW (ref >60)
Glucose, Bld: 190 mg/dL — ABNORMAL HIGH (ref 70–99)
Potassium: 3.6 mmol/L (ref 3.5–5.1)
Sodium: 136 mmol/L (ref 135–145)
Total Bilirubin: 1.3 mg/dL — ABNORMAL HIGH (ref 0.3–1.2)
Total Protein: 7 g/dL (ref 6.5–8.1)

## 2020-02-21 LAB — TROPONIN I (HIGH SENSITIVITY): Troponin I (High Sensitivity): 16 ng/L (ref <18)

## 2020-02-21 LAB — CBC
HCT: 31.8 % — ABNORMAL LOW (ref 36.0–46.0)
Hemoglobin: 10 g/dL — ABNORMAL LOW (ref 12.0–15.0)
MCH: 30.1 pg (ref 26.0–34.0)
MCHC: 31.4 g/dL (ref 30.0–36.0)
MCV: 95.8 fL (ref 80.0–100.0)
Platelets: 151 K/uL (ref 150–400)
RBC: 3.32 MIL/uL — ABNORMAL LOW (ref 3.87–5.11)
RDW: 14.5 % (ref 11.5–15.5)
WBC: 6.9 K/uL (ref 4.0–10.5)
nRBC: 0 % (ref 0.0–0.2)

## 2020-02-21 LAB — I-STAT BETA HCG BLOOD, ED (MC, WL, AP ONLY): I-stat hCG, quantitative: <5 m[IU]/mL

## 2020-02-21 LAB — LIPASE, BLOOD: Lipase: 42 U/L (ref 11–51)

## 2020-02-21 NOTE — ED Notes (Signed)
Pt said the wait was to long. Pt told risk of leaving.

## 2020-02-25 ENCOUNTER — Encounter (INDEPENDENT_AMBULATORY_CARE_PROVIDER_SITE_OTHER): Payer: Medicare Other

## 2020-02-25 DIAGNOSIS — R569 Unspecified convulsions: Secondary | ICD-10-CM

## 2020-02-25 DIAGNOSIS — I471 Supraventricular tachycardia: Secondary | ICD-10-CM | POA: Diagnosis not present

## 2020-03-19 NOTE — Progress Notes (Deleted)
CARDIOLOGY OFFICE NOTE  Date:  03/25/2020    [name] Date of Birth: Aug 29, 1985 Medical Record #[id number]  PCP:  [name], MD  Cardiologist:  Johnsie Cancel  No chief complaint on file.   History of Present Illness: [name] is a 35 y.o. female who presents today for a TOC visit - seen for Dr. Johnsie Cancel (NEW).   She has a history of ESRD on HD MWF, s/p failed renal transplant, HTN, HLD, DM type 1, GERD, XXX syndrome and anemia of chronic disease.   Seen by Dr. Johnsie Cancel when admitted for chest pain 01/2019. Echo with LVEF of 60-65% with normal WM. Follow up outpatient stress test 02/2019 was low risk.   Recently presented by EMS to the hospital for a witnessed seizure - had recurrence on the way to CT - became hypertensive and tachycardic with SVT at rate of 220 to 240's. Did not respond to Adenosine - was cardioverted with 150 joules and started on amiodarone.    EEG and MRI brain were unrevealing. Found to have no acute findings or evidence of epilepsy.  Neurology recommended to avoid any initiation of antiepileptics.   Seen by cardiology with recommendations for a 30-day monitor at discharge.  She was continued on metoprolol.   Comes in today. Here with   Past Medical History:  Diagnosis Date  . Anemia   . Chronic kidney disease    kidney transplant 07  . Diabetes mellitus    Pt reports diagnosis in June 2011, Type 2  . GERD (gastroesophageal reflux disease)   . Hyperlipidemia   . Hypertension   . 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 Pyelonephritis 06/23/2014  . Seasonal allergies   . UTI (urinary tract infection) 01/09/2015  . 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 ARTERIOVENOUS GRAFT PLACEMENT Bilateral    "neither work"  (10/24/2017)  . AV FISTULA PLACEMENT Left 10/26/2018   Procedure: CREATION OF ARTERIOVENOUS FISTULA  LEFT ARM;  Surgeon: [name], MD;  Location: [facility];  Service: Vascular;  Laterality: Left;  . BASCILIC VEIN TRANSPOSITION Left 12/21/2018   Procedure: Left arm BASILIC VEIN TRANSPOSITION SECOND STAGE;  Surgeon: [name], MD;  Location: [facility];  Service: Vascular;  Laterality: Left;  . CHOLECYSTECTOMY N/A 06/30/2017   Procedure: LAPAROSCOPIC CHOLECYSTECTOMY WITH INTRAOPERATIVE CHOLANGIOGRAM;  Surgeon: Excell Seltzer, MD;  [facility];  Service: General;  Laterality: N/A;  . ESOPHAGOGASTRODUODENOSCOPY (EGD) WITH PROPOFOL N/A 07/04/2017   Procedure: ESOPHAGOGASTRODUODENOSCOPY (EGD) WITH PROPOFOL;  Surgeon: Clarene Essex, MD;  [facility];  Service: Endoscopy;  Laterality: N/A;  . INSERTION OF DIALYSIS CATHETER N/A 03/20/2018   Procedure: INSERTION OF TUNNELED DIALYSIS CATHETER - RIGHT INTERANL JUGULAR PLACEMENT;  Surgeon: Angelia Mould, MD;  Location: [facility];  Service: Vascular;  Laterality: N/A;  . IR GUIDED [place]  10/28/2018  . KIDNEY TRANSPLANT  2007  . PARATHYROIDECTOMY  ?2012   "3/4 removed" (10/24/2017)  . RENAL BIOPSY Bilateral 2003  . UPPER EXTREMITY VENOGRAPHY Bilateral 10/19/2018   Procedure: UPPER EXTREMITY VENOGRAPHY;  Surgeon: [name], MD;  Location: [facility];  Service: Cardiovascular;  Laterality: Bilateral;  Bilateral      Medications:  No outpatient medications have been marked as taking for the 03/26/20 encounter (Appointment) with Burtis Junes, NP.     Allergies: Allergies  Allergen Reactions  . Benadryl [Diphenhydramine-Zinc Acetate] Anaphylaxis    Stopped breathing in ICU  . Diphenhydramine Anaphylaxis  . Doxycycline Shortness Of Breath    Other reaction(s): Other (See Comments) Shortness of Breath  Shortness of Breath  Shortness of Breath   . Motrin [Ibuprofen] Shortness Of  Breath and Itching    Per pt  . Shellfish-Derived Products     hives  . Shellfish Allergy Hives  . Banana Other (See Comments), Itching and Nausea And Vomiting    Sick on the stomach Other reaction(s): Other (See Comments) Sick on the stomach Sick on the stomach  . Chlorhexidine Itching  . Ferrous Sulfate Itching  . Iron Dextran Other (See Comments) and Itching    Vein irritation REACTION: vein irritation REACTION: vein irritation REACTION: vein irritation REACTION: vein irritation    Social History: The patient  reports that she has never smoked. She has never used smokeless tobacco. She reports that she does not drink alcohol and does not use drugs.   Family History: The patient's family history includes Aneurysm in her mother; Arthritis in her mother; CAD in her father; Diabetes in her father; Early death in her brother; Hypertension in her mother.   Review of Systems: Please see the history of present illness.   All other systems are reviewed and negative.   Physical Exam: VS:  There were no vitals taken for this visit. Marland Kitchen  BMI There is no height or weight on file to calculate BMI.  Wt Readings from Last 3 Encounters:  02/15/20 185 lb 13.6 oz (84.3 kg)  12/11/19 188 lb 4.8 oz (85.4 kg)  10/15/19 175 lb 11.3 oz (79.7 kg)    General: Pleasant. Well developed, well nourished and in no acute distress.   HEENT: Normal.  Neck: Supple, no JVD, carotid bruits, or masses noted.  Cardiac: ***Regular rate and rhythm. No murmurs, rubs, or gallops. No edema.  Respiratory:  Lungs are clear to auscultation bilaterally with normal work of breathing.  GI: Soft and nontender.  MS: No deformity or atrophy. Gait and ROM intact.  Skin: Warm and dry. Color is normal.  Neuro:  Strength and sensation are intact and no gross focal deficits noted.  Psych: Alert, appropriate and with normal affect.   LABORATORY DATA:  EKG:  EKG {ACTION; IS/IS [id number]} ordered today.  Personally  reviewed by me. This demonstrates ***.  Lab Results  Component Value Date   WBC 6.9 02/21/2020   HGB 10.0 (L) 02/21/2020   HCT 31.8 (L) 02/21/2020   PLT 151 02/21/2020   GLUCOSE 190 (H) 02/21/2020   CHOL 166 10/24/2008   TRIG 238 10/24/2008   HDL 33 10/24/2008   LDLDIRECT 65 01/20/2012   LDLCALC 85 10/24/2008   ALT 15 02/21/2020   AST 17 02/21/2020   NA 136 02/21/2020   K 3.6 02/21/2020   CL 96 (L) 02/21/2020   CREATININE 5.17 (H) 02/21/2020   BUN 15 02/21/2020   CO2 25 02/21/2020   TSH 3.331 02/13/2020   INR 1.2 02/12/2020   HGBA1C 9.2 (H) 06/17/2019     BNP (last 3 results) No results for input(s): BNP in the last 8760 hours.  ProBNP (last 3 results) No results for input(s): PROBNP in the last 8760 hours.   Other Studies Reviewed Today:  Monitor Study Highlights 03/2020  NSR Her symptoms  of palpitations, tachycardia and dyspnea all showed only NSR with no arrhythmia One asymptomatic run of NSVT 13 beats     ECHO IMPRESSIONS 02/2020  1. Limited study due to pt vomiting; only parasternal and one apical  image obtained.  2. Left ventricular ejection fraction, by estimation, is 55 to 60%. The  left ventricle has normal function. The left ventricle has no regional  wall motion abnormalities. Left ventricular diastolic function could not  be evaluated.  3. Right ventricular systolic function is normal. The right ventricular  size is normal. Tricuspid regurgitation signal is inadequate for assessing  PA pressure.  4. The mitral valve is normal in structure. Trivial mitral valve  regurgitation. No evidence of mitral stenosis.  5. The aortic valve has an indeterminant number of cusps. Aortic valve  regurgitation Not assessed. Not assessed.    Echo 01/2019  1. Left ventricular ejection fraction, by visual estimation, is 60 to  65%. The left ventricle has normal function. There is no left ventricular  hypertrophy.   2. Global right ventricle has normal  systolic function.The right  ventricular size is normal. No increase in right ventricular wall  thickness.   3. Left atrial size was normal.   4. Right atrial size was normal.   5. The mitral valve is normal in structure. Trace mitral valve  regurgitation. No evidence of mitral stenosis.   6. The tricuspid valve is normal in structure. Tricuspid valve  regurgitation is mild.   7. The aortic valve is grossly normal. Aortic valve regurgitation is  trivial. No evidence of aortic valve sclerosis or stenosis.   8. There is Mild focal calcification of the aortic valve adjacent to the  noncoronary-left coronary commissure.   9. The pulmonic valve was normal in structure. Pulmonic valve  regurgitation is trivial.  10. Normal pulmonary artery systolic pressure.  11. The inferior vena cava is normal in size with greater than 50%  respiratory variability, suggesting right atrial pressure of 3 mmHg.    Stress test 02/2019  Nuclear stress EF: 61%.  There was no ST segment deviation noted during stress.  This is a low risk study.  The left ventricular ejection fraction is normal (55-65%).   Small fixed perfusion defect in the inferoapex may represent diaphragmatic attenuation.         Assessment and Plan:    1. SVT - Patient had narrow complex tachycardia while in CT scanner. Did not responded to adenosine. Converted to sinus with synchronized cardioversion at 150J. Currently in sinus tachycardia in 100-110s on IV amiodarone. She does have underlying repolarization abnormality on prior EKG. Some ectopy on most recent EKG. ? Underlying wide complex tachycardia.  - Also ? Seizure like activity when pt was tachycardic. No tachycardic rhythm strip available for review. ER note has one strip. Reviewed rhythm on telemetry.  - No Evidence of structural heart abnormality on prior echo  - Repeat echo  -  Metoprolol tartrate 25mg  BID and Coreg 12.5mg  BID listed on home meds>> needs review    2.  AMS and Seizure - She had witness seizure at home and in ER  - Prior hx of pseudoseizures - CT of acute without acute process - pending neuro eval   3. ESRD on HD and failed renal transplant - pending nephrology eval   4. HTN - BP improving    Current medicines are reviewed with the patient today.  The patient does not have concerns regarding medicines other than what has been noted  above.  The following changes have been made:  See above.  Labs/ tests ordered today include:   No orders of the defined types were placed in this encounter.    Disposition:   FU with *** in {gen number [id number]} {Days to years:10300}.   Patient is agreeable to this plan and will call if any problems develop in the interim.   SignedTruitt Merle, NP  03/25/2020 12:13 PM  [facility] [address] [place], [place]  [address] Phone: [phone] Fax: [phone]

## 2020-03-26 ENCOUNTER — Ambulatory Visit: Payer: Medicare Other | Admitting: Nurse Practitioner

## 2020-04-09 NOTE — Progress Notes (Signed)
CARDIOLOGY CONSULT NOTE       Patient ID: [name] MRN: [id number] DOB/AGE: [birth year] 35 y.o.  Admit date: (Not on file) Referring Physician: [place] Primary Physician: [name], MD Primary Cardiologist: Johnsie Cancel Reason for Consultation: Preoperative Clearance   Active Problems:   * No active hospital problems. *   HPI:  35 y.o. with CRF on dialysis with failed renal transplant.2007  Being considered for re-transplant History of DM, HTN and GERD. Seen for atypical chest pain 02/07/19 Myovue 02/20/19 normal EF 61% Echo 02/14/20 EF 55-60%  Trivial MR She had monitor for palpitations 02/25/20 which showed NSR no correlation of symptoms with any arrhythmia Seen by Dr Margaretann Loveless 02/12/20 for tachycardia post seizure. Had SVT rate 220 bpm not responsive to adenosine and cardioverted Has been Rx with beta blocker ? Pseudo-seizures currently not on anti seizure meds Hct runs around 31 with most recent Cr >5 and K 3.6    She does not want to be here today She is very confrontational and indicated she told the "[facility]" doctors she  Didn't need to see anyone. I explained to her if she is a diabetic especially with poor control she would need a myovue To clear for re transplantation.   She denies SSCP, dyspnea recurrent palpitations or syncope   ROS All other systems reviewed and negative except as noted above  Past Medical History:  Diagnosis Date  . Anemia   . Chronic kidney disease    kidney transplant 07  . Diabetes mellitus    Pt reports diagnosis in June 2011, Type 2  . GERD (gastroesophageal reflux disease)   . Hyperlipidemia   . Hypertension   . 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 Pyelonephritis 06/23/2014  . Seasonal allergies   . UTI (urinary tract infection) 01/09/2015  . 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   Qualifier: Diagnosis of  By: Carlena Sax   MD, Colletta Maryland      Family History  Problem Relation Age of Onset  . Arthritis Mother   . Hypertension Mother   . Aneurysm Mother        died of brain aneurysm  . CAD Father        Has 3 stents  . Diabetes Father        borderline  . Early death Brother        Died in war    Social History   Socioeconomic History  . Marital status: Single    Spouse name: Not on file  . Number of children: Not on file  . Years of education: Not on file  . Highest education level: Not on file  Occupational History  . 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 Smoking status: Never Smoker  . Smokeless tobacco: Never Used  Vaping Use  . Vaping Use: Never used  Substance and Sexual Activity  . Alcohol use: No  . Drug use: No  . Sexual activity: Yes    Birth control/protection: None  Other Topics Concern  . Not on file  Social History Narrative   Patient reports that she is single, she is employed at the [employer]   No alcohol tobacco or drug use   Social Determinants of Radio broadcast assistant Strain: Not on file  Food Insecurity: Not on  file  Transportation Needs: Not on file  Physical Activity: Not on file  Stress: Not on file  Social Connections: Not on file  Intimate Partner Violence: Not on file    Past Surgical History:  Procedure Laterality Date  . ARTERIOVENOUS GRAFT PLACEMENT Bilateral    "neither work" (10/24/2017)  . AV FISTULA PLACEMENT Left 10/26/2018   Procedure: CREATION OF ARTERIOVENOUS FISTULA  LEFT ARM;  Surgeon: [name], MD;  Location: [facility];  Service: Vascular;  Laterality: Left;  . BASCILIC VEIN TRANSPOSITION Left 12/21/2018   Procedure: Left arm BASILIC VEIN TRANSPOSITION SECOND STAGE;  Surgeon: [name], MD;  Location: [facility];  Service: Vascular;  Laterality: Left;  . CHOLECYSTECTOMY N/A 06/30/2017   Procedure: LAPAROSCOPIC CHOLECYSTECTOMY WITH INTRAOPERATIVE CHOLANGIOGRAM;  Surgeon:  Excell Seltzer, MD;  [facility];  Service: General;  Laterality: N/A;  . ESOPHAGOGASTRODUODENOSCOPY (EGD) WITH PROPOFOL N/A 07/04/2017   Procedure: ESOPHAGOGASTRODUODENOSCOPY (EGD) WITH PROPOFOL;  Surgeon: Clarene Essex, MD;  [facility];  Service: Endoscopy;  Laterality: N/A;  . INSERTION OF DIALYSIS CATHETER N/A 03/20/2018   Procedure: INSERTION OF TUNNELED DIALYSIS CATHETER - RIGHT INTERANL JUGULAR PLACEMENT;  Surgeon: Angelia Mould, MD;  Location: [facility];  Service: Vascular;  Laterality: N/A;  . IR GUIDED [place]  10/28/2018  . KIDNEY TRANSPLANT  2007  . PARATHYROIDECTOMY  ?2012   "3/4 removed" (10/24/2017)  . RENAL BIOPSY Bilateral 2003  . UPPER EXTREMITY VENOGRAPHY Bilateral 10/19/2018   Procedure: UPPER EXTREMITY VENOGRAPHY;  Surgeon: [name], MD;  Location: [facility];  Service: Cardiovascular;  Laterality: Bilateral;  Bilateral       Current Outpatient Medications:  .  ACCU-CHEK SOFTCLIX LANCETS lancets, Use to check blood sugar 4 times per day., Disp: 150 each, Rfl: 5 .  acetaminophen (TYLENOL) 500 MG tablet, Take 1,000 mg by mouth every 6 (six) hours as needed for headache (pain). , Disp: , Rfl:  .  amLODipine (NORVASC) 5 MG tablet, Take 1 tablet (5 mg total) by mouth daily., Disp: 30 tablet, Rfl: 3 .  calcium acetate (PHOSLO) 667 MG capsule, Take 667-1,334 mg by mouth See admin instructions. Take 2 capsules (1334 mg) by mouth up to 4 times daily with meals, take 1 capsule (667 mg) with snacks, Disp: , Rfl:  .  Calcium Carbonate Antacid (CALCIUM CARBONATE, DOSED IN MG ELEMENTAL CALCIUM,) 1250 MG/5ML SUSP, Take 5 mLs by mouth 3 (three) times daily. , Disp: , Rfl:  .  cetirizine (ZYRTEC) 10 MG tablet, Take 10 mg by mouth daily as needed for allergies., Disp: , Rfl:  .  D 1000 25 MCG (1000 UT) capsule, Take 1,000 Units by mouth every other day. , Disp: , Rfl:  .  diclofenac Sodium (VOLTAREN) 1 % GEL, Apply 2 g topically 4  (four) times daily., Disp: , Rfl:  .  fluticasone (FLONASE) 50 MCG/ACT nasal spray, Place 2 sprays into both nostrils daily as needed for allergies., Disp: , Rfl:  .  gabapentin (NEURONTIN) 400 MG capsule, Take 400 mg by mouth 3 (three) times daily., Disp: , Rfl:  .  hydrOXYzine (ATARAX/VISTARIL) 50 MG tablet, Take 1 tablet (50 mg total) by mouth 2 (two) times daily as needed for itching. (Patient taking differently: Take 50 mg by mouth 2 (two) times daily as needed for itching.), Disp: 30 tablet, Rfl: 0 .  insulin aspart (NOVOLOG) 100 UNIT/ML FlexPen, Inject 2-10 Units into the skin 3 (three) times daily with meals. as needed  for blood sugar management (sliding scale), Disp: , Rfl:  .  insulin degludec (TRESIBA) 100 UNIT/ML FlexTouch Pen, Inject 20 Units into the skin daily., Disp: , Rfl:  .  lidocaine (XYLOCAINE) 2 % solution, Use as directed 15 mLs in the mouth or throat every 6 (six) hours as needed for mouth pain., Disp: 100 mL, Rfl: 0 .  lidocaine-prilocaine (EMLA) cream, Apply 1 application topically See admin instructions. Apply topically one hour prior to dialysis on Monday, Wednesday, Friday, Disp: , Rfl:  .  loperamide (IMODIUM) 1 MG/5ML solution, Take 2 mg by mouth 2 (two) times daily as needed for diarrhea or loose stools. , Disp: , Rfl:  .  Methoxy PEG-Epoetin Beta (MIRCERA IJ), Mircera, Disp: , Rfl:  .  metoCLOPramide (REGLAN) 5 MG tablet, Take 1 tablet (5 mg total) by mouth 3 (three) times daily before meals., Disp: 90 tablet, Rfl: 3 .  metoprolol tartrate (LOPRESSOR) 25 MG tablet, Take 1 tablet (25 mg total) by mouth 2 (two) times daily., Disp: 60 tablet, Rfl: 3 .  pantoprazole (PROTONIX) 40 MG tablet, Take 40 mg by mouth 2 (two) times daily. , Disp: , Rfl:  .  promethazine (PHENERGAN) 12.5 MG suppository, Place 12.5 mg rectally 5 (five) times daily as needed for nausea or vomiting. , Disp: , Rfl:  .  promethazine (PHENERGAN) 25 MG tablet, Take 25 mg by mouth every 6 (six) hours as  needed for nausea or vomiting. , Disp: , Rfl:  .  simvastatin (ZOCOR) 20 MG tablet, Take 20 mg by mouth at bedtime. , Disp: , Rfl:  .  Hyoscyamine Sulfate SL (LEVSIN/SL) 0.125 MG SUBL, Place 1 each under the tongue 4 (four) times daily as needed for up to 5 days., Disp: 30 tablet, Rfl: 0    Physical Exam: Blood pressure 116/76, pulse 100, height '5\' 6"'$  (1.676 m), weight 84.6 kg, SpO2 91 %.   Affect appropriate Chronically ill  HEENT: normal Neck supple with no adenopathy JVP normal no bruits no thyromegaly Lungs clear with no wheezing and good diaphragmatic motion Heart:  S1/S2 SEM  murmur, no rub, gallop or click PMI normal Abdomen: benighn, post renal transplant  Clotted fistulas both arms Active fistula with thrill in right groin  No edema Neuro non-focal Skin warm and dry No muscular weakness   Labs:   Lab Results  Component Value Date   WBC 6.9 02/21/2020   HGB 10.0 (L) 02/21/2020   HCT 31.8 (L) 02/21/2020   MCV 95.8 02/21/2020   PLT 151 02/21/2020   No results for input(s): NA, K, CL, CO2, BUN, CREATININE, CALCIUM, PROT, BILITOT, ALKPHOS, ALT, AST, GLUCOSE in the last 168 hours.  Invalid input(s): LABALBU Lab Results  Component Value Date   CKTOTAL 61 06/16/2019   TROPONINI <0.03 07/13/2017    Lab Results  Component Value Date   CHOL 166 10/24/2008   CHOL 143 09/25/2007   Lab Results  Component Value Date   HDL 33 10/24/2008   HDL 26 (L) 09/25/2007   Lab Results  Component Value Date   LDLCALC 85 10/24/2008   LDLCALC 48 09/25/2007   Lab Results  Component Value Date   TRIG 238 10/24/2008   TRIG 346 (H) 09/25/2007   Lab Results  Component Value Date   CHOLHDL 5.5 Ratio 09/25/2007   Lab Results  Component Value Date   LDLDIRECT 65 01/20/2012      Radiology: CARDIAC EVENT MONITOR  Result Date: 03/26/2020 NSR Her symptoms of palpitations, tachycardia and  dyspnea all showed only NSR with no arrhythmia One asymptomatic run of NSVT 13 beats     EKG: SR LAD LVH poor R wave progression 02/21/20   ASSESSMENT AND PLAN:   1. Preoperative:  No documented CAD. Normal myovue 02/20/19 will repeat lexiscan myovue as usual protocol pre transplant given CRF;s DM, HTN and HLD  2. SVT: quiesent f/u monitor with no arrhythmias to correlate with symptoms No structural heart disease on TTE and no high risk features on ECG  3. DM:  Discussed low carb diet.  Target hemoglobin A1c is 6.5 or less.  Continue current medications.  4. HLD: continue statin   5. HTN:  Well controlled.  Continue current medications and low sodium Dash type diet.    6. Seizures: ? Pseudo f/u neurology currently not on Rx Normal MRI brain on 02/13/20   [facility] for transplant if non ischemic F/U PRN  Signed: Jenkins Rouge 04/11/2020, 3:47 PM

## 2020-04-11 ENCOUNTER — Encounter: Payer: Self-pay | Admitting: Cardiovascular Disease

## 2020-04-11 ENCOUNTER — Ambulatory Visit (INDEPENDENT_AMBULATORY_CARE_PROVIDER_SITE_OTHER): Payer: Medicare Other | Admitting: Cardiovascular Disease

## 2020-04-11 ENCOUNTER — Other Ambulatory Visit: Payer: Self-pay

## 2020-04-11 VITALS — BP 116/76 | HR 100 | Ht 66.0 in | Wt 186.4 lb

## 2020-04-11 DIAGNOSIS — I471 Supraventricular tachycardia, unspecified: Secondary | ICD-10-CM

## 2020-04-11 DIAGNOSIS — Z01818 Encounter for other preprocedural examination: Secondary | ICD-10-CM

## 2020-04-11 DIAGNOSIS — I10 Essential (primary) hypertension: Secondary | ICD-10-CM

## 2020-04-11 DIAGNOSIS — E785 Hyperlipidemia, unspecified: Secondary | ICD-10-CM | POA: Diagnosis not present

## 2020-04-11 NOTE — Patient Instructions (Addendum)
Medication Instructions:  *If you need a refill on your cardiac medications before your next appointment, please call your pharmacy*  Lab Work: If you have labs (blood work) drawn today and your tests are completely normal, you will receive your results only by: Marland Kitchen MyChart Message (if you have MyChart) OR . A paper copy in the mail If you have any lab test that is abnormal or we need to change your treatment, we will call you to review the results.  Testing/Procedures: Your physician has requested that you have a lexiscan myoview. For further information please visit [email or web address]. Please follow instruction sheet, as given.  Follow-Up: At [facility], you and your health needs are our priority.  As part of our continuing mission to provide you with exceptional heart care, we have created designated Provider Care Teams.  These Care Teams include your primary Cardiologist (physician) and Advanced Practice Providers (APPs -  Physician Assistants and Nurse Practitioners) who all work together to provide you with the care you need, when you need it.  We recommend signing up for the patient portal called "MyChart".  Sign up information is provided on this After Visit Summary.  MyChart is used to connect with patients for Virtual Visits (Telemedicine).  Patients are able to view lab/test results, encounter notes, upcoming appointments, etc.  Non-urgent messages can be sent to your provider as well.   To learn more about what you can do with MyChart, go to [email or web address].    Your next appointment:   12 month(s)  The format for your next appointment:   In Person  Provider:   You may see Jenkins Rouge, MD or one of the following Advanced Practice Providers on your designated Care Team:    Kathyrn Drown, NP

## 2020-04-14 ENCOUNTER — Other Ambulatory Visit (HOSPITAL_COMMUNITY): Payer: Self-pay | Admitting: Nephrology

## 2020-04-14 DIAGNOSIS — N186 End stage renal disease: Secondary | ICD-10-CM

## 2020-04-16 ENCOUNTER — Other Ambulatory Visit: Payer: Self-pay | Admitting: Radiology

## 2020-04-17 ENCOUNTER — Other Ambulatory Visit: Payer: Self-pay | Admitting: Internal Medicine

## 2020-04-17 ENCOUNTER — Other Ambulatory Visit: Payer: Self-pay | Admitting: Radiology

## 2020-04-17 DIAGNOSIS — B951 Streptococcus, group B, as the cause of diseases classified elsewhere: Secondary | ICD-10-CM | POA: Insufficient documentation

## 2020-04-18 ENCOUNTER — Ambulatory Visit (HOSPITAL_COMMUNITY)
Admission: RE | Admit: 2020-04-18 | Discharge: 2020-04-18 | Disposition: A | Discharge status: Home / Self Care | Payer: Medicare Other | Source: Ambulatory Visit | Attending: Nephrology | Admitting: Nephrology

## 2020-04-18 ENCOUNTER — Other Ambulatory Visit: Payer: Self-pay

## 2020-04-18 DIAGNOSIS — T8249XA Other complication of vascular dialysis catheter, initial encounter: Secondary | ICD-10-CM | POA: Insufficient documentation

## 2020-04-18 DIAGNOSIS — E1010 Type 1 diabetes mellitus with ketoacidosis without coma: Secondary | ICD-10-CM | POA: Diagnosis not present

## 2020-04-18 DIAGNOSIS — Y841 Kidney dialysis as the cause of abnormal reaction of the patient, or of later complication, without mention of misadventure at the time of the procedure: Secondary | ICD-10-CM | POA: Insufficient documentation

## 2020-04-18 DIAGNOSIS — N186 End stage renal disease: Secondary | ICD-10-CM

## 2020-04-18 DIAGNOSIS — R739 Hyperglycemia, unspecified: Secondary | ICD-10-CM | POA: Diagnosis not present

## 2020-04-18 HISTORY — PX: IR FLUORO GUIDE CV LINE RIGHT: IMG2283

## 2020-04-18 LAB — CBC
HCT: 32.1 % — ABNORMAL LOW (ref 35.0–45.0)
Hemoglobin: 10.6 g/dL — ABNORMAL LOW (ref 11.7–15.5)
MCH: 30.9 pg (ref 27.0–33.0)
MCHC: 33 g/dL (ref 32.0–36.0)
MCV: 93.6 fL (ref 80.0–100.0)
MPV: 10.8 fL (ref 7.5–12.5)
Platelets: 182 10*3/uL (ref 140–400)
RBC: 3.43 10*6/uL — ABNORMAL LOW (ref 3.80–5.10)
RDW: 16.9 % — ABNORMAL HIGH (ref 11.0–15.0)
WBC: 6.7 10*3/uL (ref 3.8–10.8)

## 2020-04-18 LAB — COMPLETE METABOLIC PANEL WITH GFR
AG Ratio: 1 (calc) (ref 1.0–2.5)
ALT: 8 U/L (ref 6–29)
AST: 12 U/L (ref 10–30)
Albumin: 3.6 g/dL (ref 3.6–5.1)
Alkaline phosphatase (APISO): 65 U/L (ref 31–125)
BUN/Creatinine Ratio: 6 (calc) (ref 6–22)
BUN: 44 mg/dL — ABNORMAL HIGH (ref 7–25)
CO2: 21 mmol/L (ref 20–32)
Calcium: 8 mg/dL — ABNORMAL LOW (ref 8.6–10.2)
Chloride: 94 mmol/L — ABNORMAL LOW (ref 98–110)
Creat: 7.77 mg/dL — ABNORMAL HIGH (ref 0.50–1.10)
GFR, Est African American: 7 mL/min/{1.73_m2} — ABNORMAL LOW (ref >=60)
GFR, Est Non African American: 6 mL/min/{1.73_m2} — ABNORMAL LOW (ref >=60)
Globulin: 3.5 g/dL (calc) (ref 1.9–3.7)
Glucose, Bld: 302 mg/dL — ABNORMAL HIGH (ref 65–99)
Potassium: 4.6 mmol/L (ref 3.5–5.3)
Sodium: 133 mmol/L — ABNORMAL LOW (ref 135–146)
Total Bilirubin: 0.4 mg/dL (ref 0.2–1.2)
Total Protein: 7.1 g/dL (ref 6.1–8.1)

## 2020-04-18 LAB — LIPID PANEL
Cholesterol: 133 mg/dL (ref <200)
HDL: 37 mg/dL — ABNORMAL LOW (ref >=50)
LDL Cholesterol (Calc): 66 mg/dL (calc)
Non-HDL Cholesterol (Calc): 96 mg/dL (calc) (ref <130)
Total CHOL/HDL Ratio: 3.6 (calc) (ref <5.0)
Triglycerides: 255 mg/dL — ABNORMAL HIGH (ref <150)

## 2020-04-18 LAB — VITAMIN D 25 HYDROXY (VIT D DEFICIENCY, FRACTURES): Vit D, 25-Hydroxy: 13 ng/mL — ABNORMAL LOW (ref 30–100)

## 2020-04-18 LAB — TSH: TSH: 1.53 mIU/L

## 2020-04-18 MED ORDER — HEPARIN SODIUM (PORCINE) 1000 UNIT/ML IJ SOLN
INTRAMUSCULAR | Status: AC
  Filled 2020-04-18: qty 1

## 2020-04-18 MED ORDER — LIDOCAINE HCL 1 % IJ SOLN
INTRAMUSCULAR | Status: DC | PRN
  Administered 2020-04-18: 10 mL

## 2020-04-18 MED ORDER — ONDANSETRON HCL 4 MG/2ML IJ SOLN
INTRAMUSCULAR | Status: AC
  Filled 2020-04-18: qty 2

## 2020-04-18 MED ORDER — CEFAZOLIN SODIUM-DEXTROSE 2-4 GM/100ML-% IV SOLN: 2.0000 g | INTRAVENOUS | Status: AC

## 2020-04-18 MED ORDER — LIDOCAINE HCL 1 % IJ SOLN
INTRAMUSCULAR | Status: AC
  Filled 2020-04-18: qty 20

## 2020-04-18 MED ORDER — CEFAZOLIN SODIUM-DEXTROSE 2-4 GM/100ML-% IV SOLN
INTRAVENOUS | Status: AC
  Administered 2020-04-18: 2 g via INTRAVENOUS
  Filled 2020-04-18: qty 100

## 2020-04-18 MED ORDER — SODIUM CHLORIDE 0.9 % IV SOLN: INTRAVENOUS | Status: DC

## 2020-04-18 NOTE — Progress Notes (Signed)
   04/18/20 1526  Vital Signs  Pulse Rate (!) 119  BP (!) 220/129  MAP (mmHg) 156  BP Location Left Arm  BP Method Automatic  Patient Position (if appropriate) Sitting  MEWS Score  MEWS Temp 0  MEWS Systolic 2  MEWS Pulse 2  MEWS RR 0  MEWS LOC 0  MEWS Score 4  MEWS Score Color Red    PA Kacie notified of patient's blood pressure and vomiting continuing after IV Zofran. She complains of stomach ache and discomfort in her abdomen.

## 2020-04-18 NOTE — Progress Notes (Deleted)
Recheck of BP once patient no longer vommitting.  When asked, patient stated she did not take her BP medication this morning as she was going to dialysis.  Seen by PA Robyne Peers and strongly advised to go to the Emergency Room for evaluation and treatment.

## 2020-04-18 NOTE — Progress Notes (Signed)
Strongly advised patient to be evaluated in Emergency Department due to elevated blood pressure, patient refused and wants to go home. Requested for her ride [facility] to be called.  This RN contacted Evette Doffing, escorted patient to lobby via wheelchair and observed as patient entered private vehicle. Discharge to care of friend Evette Doffing.

## 2020-04-18 NOTE — Procedures (Signed)
Interventional Radiology Procedure Note  Procedure: Dialysis catheter exchange under fluoro  Complications: None  Estimated Blood Loss: < 10 mL  Findings: Left femoral tunneled catheter exchanged over wires with new 44 cm tip to cuff length Palindrome catheter. Tip just in lower RA. OK to use.  Venetia Night. Kathlene Cote, M.D Pager:  [phone]

## 2020-04-18 NOTE — Progress Notes (Addendum)
Patient arrived to Radiology Nurses Station after dialysis catheter exchange. Patient vommitting, nauseated, and crying. She states "this is how I feel when they pull too much fluid". After seeking clarification from patient, she feels that the dialysis session today removed too much fluid during treatment.  Patient was given Zofran IV as ordered for nausea/vommitting and Ancef IV as ordered.   This RN contacted PA Sherlie Ban with [facility] Radiology, notified her of above.  No new orders given.

## 2020-04-18 NOTE — Progress Notes (Signed)
Patient continues with nausea and vomiting.  Symptoms not improved.  Now saying she did not take her BP medication this AM.  BP now elevated to 220/139.  Patient not stable for discharge.  Referring to the ED for ongoing management. Called and discussed with charge RN.  Patient to be taken to registration/triage.  IR RN aware to bring patient in wheelchair.   Brynda Greathouse, MS RD PA-C 3:37 PM

## 2020-04-18 NOTE — Progress Notes (Signed)
Patient assessed prior to procedure in IR today.  Complains of nausea and general acheness after dialysis this morning.  States "this is how I feel when they pull too much fluid."  Called to [employer].  RN reports well tolerated dialysis session this AM, 84  83 kg, optiflux dialyzer. No complications.  Patient's blood pressure is elevated, but trending down with conservative management.  Given zofran and ancef.  Will monitor.   Brynda Greathouse, MS RD PA-C 3:11 PM

## 2020-04-18 NOTE — Progress Notes (Signed)
   04/18/20 1545  Vital Signs  Pulse Rate (!) 108  BP (!) 199/117  MAP (mmHg) 142  BP Location Left Arm  BP Method Automatic  Patient Position (if appropriate) Sitting  MEWS Score  MEWS Temp 0  MEWS Systolic 0  MEWS Pulse 1  MEWS RR 0  MEWS LOC 0  MEWS Score 1  MEWS Score Color Green   Recheck of BP once patient no longer vommitting.  When asked, patient stated she did not take her BP medication this morning as she was going to dialysis.  Seen by PA Robyne Peers and strongly advised to go to the Emergency Room for evaluation and treatment.

## 2020-04-20 ENCOUNTER — Encounter (HOSPITAL_COMMUNITY): Payer: Self-pay | Admitting: Emergency Medicine

## 2020-04-20 ENCOUNTER — Emergency Department (HOSPITAL_COMMUNITY): Payer: Medicare Other

## 2020-04-20 ENCOUNTER — Inpatient Hospital Stay (HOSPITAL_COMMUNITY)
Admission: EM | Admit: 2020-04-20 | Discharge: 2020-04-23 | DRG: 637 | Disposition: A | Discharge status: Home / Self Care | Payer: Medicare Other | Attending: Internal Medicine | Admitting: Internal Medicine

## 2020-04-20 ENCOUNTER — Other Ambulatory Visit: Payer: Self-pay

## 2020-04-20 DIAGNOSIS — Z20822 Contact with and (suspected) exposure to covid-19: Secondary | ICD-10-CM | POA: Diagnosis present

## 2020-04-20 DIAGNOSIS — E1300 Other specified diabetes mellitus with hyperosmolarity without nonketotic hyperglycemic-hyperosmolar coma (NKHHC): Secondary | ICD-10-CM | POA: Diagnosis present

## 2020-04-20 DIAGNOSIS — E1010 Type 1 diabetes mellitus with ketoacidosis without coma: Principal | ICD-10-CM | POA: Diagnosis present

## 2020-04-20 DIAGNOSIS — Z6829 Body mass index (BMI) 29.0-29.9, adult: Secondary | ICD-10-CM | POA: Diagnosis not present

## 2020-04-20 DIAGNOSIS — E663 Overweight: Secondary | ICD-10-CM

## 2020-04-20 DIAGNOSIS — E1065 Type 1 diabetes mellitus with hyperglycemia: Secondary | ICD-10-CM | POA: Insufficient documentation

## 2020-04-20 DIAGNOSIS — R9431 Abnormal electrocardiogram [ECG] [EKG]: Secondary | ICD-10-CM

## 2020-04-20 DIAGNOSIS — E669 Obesity, unspecified: Secondary | ICD-10-CM | POA: Diagnosis present

## 2020-04-20 DIAGNOSIS — I10 Essential (primary) hypertension: Secondary | ICD-10-CM | POA: Diagnosis not present

## 2020-04-20 DIAGNOSIS — Z91018 Allergy to other foods: Secondary | ICD-10-CM

## 2020-04-20 DIAGNOSIS — K449 Diaphragmatic hernia without obstruction or gangrene: Secondary | ICD-10-CM | POA: Diagnosis present

## 2020-04-20 DIAGNOSIS — Z794 Long term (current) use of insulin: Secondary | ICD-10-CM

## 2020-04-20 DIAGNOSIS — Z87440 Personal history of urinary (tract) infections: Secondary | ICD-10-CM | POA: Diagnosis not present

## 2020-04-20 DIAGNOSIS — Z888 Allergy status to other drugs, medicaments and biological substances status: Secondary | ICD-10-CM

## 2020-04-20 DIAGNOSIS — Z79899 Other long term (current) drug therapy: Secondary | ICD-10-CM

## 2020-04-20 DIAGNOSIS — K920 Hematemesis: Secondary | ICD-10-CM | POA: Diagnosis not present

## 2020-04-20 DIAGNOSIS — N250 Renal osteodystrophy: Secondary | ICD-10-CM | POA: Diagnosis present

## 2020-04-20 DIAGNOSIS — Z992 Dependence on renal dialysis: Secondary | ICD-10-CM | POA: Diagnosis not present

## 2020-04-20 DIAGNOSIS — Z87892 Personal history of anaphylaxis: Secondary | ICD-10-CM

## 2020-04-20 DIAGNOSIS — F819 Developmental disorder of scholastic skills, unspecified: Secondary | ICD-10-CM | POA: Diagnosis present

## 2020-04-20 DIAGNOSIS — I120 Hypertensive chronic kidney disease with stage 5 chronic kidney disease or end stage renal disease: Secondary | ICD-10-CM | POA: Diagnosis present

## 2020-04-20 DIAGNOSIS — N186 End stage renal disease: Secondary | ICD-10-CM | POA: Diagnosis present

## 2020-04-20 DIAGNOSIS — R1084 Generalized abdominal pain: Secondary | ICD-10-CM | POA: Diagnosis not present

## 2020-04-20 DIAGNOSIS — K3184 Gastroparesis: Secondary | ICD-10-CM | POA: Diagnosis present

## 2020-04-20 DIAGNOSIS — T8612 Kidney transplant failure: Secondary | ICD-10-CM | POA: Diagnosis present

## 2020-04-20 DIAGNOSIS — R112 Nausea with vomiting, unspecified: Secondary | ICD-10-CM | POA: Diagnosis not present

## 2020-04-20 DIAGNOSIS — D631 Anemia in chronic kidney disease: Secondary | ICD-10-CM | POA: Diagnosis present

## 2020-04-20 DIAGNOSIS — E785 Hyperlipidemia, unspecified: Secondary | ICD-10-CM | POA: Diagnosis present

## 2020-04-20 DIAGNOSIS — R109 Unspecified abdominal pain: Secondary | ICD-10-CM | POA: Diagnosis present

## 2020-04-20 DIAGNOSIS — K219 Gastro-esophageal reflux disease without esophagitis: Secondary | ICD-10-CM | POA: Diagnosis present

## 2020-04-20 DIAGNOSIS — E871 Hypo-osmolality and hyponatremia: Secondary | ICD-10-CM | POA: Diagnosis present

## 2020-04-20 DIAGNOSIS — E1069 Type 1 diabetes mellitus with other specified complication: Secondary | ICD-10-CM

## 2020-04-20 DIAGNOSIS — K2100 Gastro-esophageal reflux disease with esophagitis, without bleeding: Secondary | ICD-10-CM | POA: Diagnosis present

## 2020-04-20 DIAGNOSIS — E1043 Type 1 diabetes mellitus with diabetic autonomic (poly)neuropathy: Secondary | ICD-10-CM | POA: Diagnosis present

## 2020-04-20 DIAGNOSIS — Z8249 Family history of ischemic heart disease and other diseases of the circulatory system: Secondary | ICD-10-CM

## 2020-04-20 DIAGNOSIS — Z6830 Body mass index (BMI) 30.0-30.9, adult: Secondary | ICD-10-CM | POA: Diagnosis not present

## 2020-04-20 DIAGNOSIS — R739 Hyperglycemia, unspecified: Secondary | ICD-10-CM

## 2020-04-20 DIAGNOSIS — T8249XA Other complication of vascular dialysis catheter, initial encounter: Secondary | ICD-10-CM | POA: Diagnosis present

## 2020-04-20 DIAGNOSIS — Z833 Family history of diabetes mellitus: Secondary | ICD-10-CM

## 2020-04-20 DIAGNOSIS — E870 Hyperosmolality and hypernatremia: Secondary | ICD-10-CM

## 2020-04-20 DIAGNOSIS — Z91013 Allergy to seafood: Secondary | ICD-10-CM

## 2020-04-20 LAB — COMPREHENSIVE METABOLIC PANEL
ALT: <5 U/L (ref 0–44)
AST: 14 U/L — ABNORMAL LOW (ref 15–41)
Albumin: 3.6 g/dL (ref 3.5–5.0)
Alkaline Phosphatase: 57 U/L (ref 38–126)
Anion gap: 29 — ABNORMAL HIGH (ref 5–15)
BUN: 84 mg/dL — ABNORMAL HIGH (ref 6–20)
CO2: 22 mmol/L (ref 22–32)
Calcium: 8.3 mg/dL — ABNORMAL LOW (ref 8.9–10.3)
Chloride: 79 mmol/L — ABNORMAL LOW (ref 98–111)
Creatinine, Ser: 10.9 mg/dL — ABNORMAL HIGH (ref 0.44–1.00)
GFR, Estimated: 4 mL/min — ABNORMAL LOW (ref >60)
Glucose, Bld: 680 mg/dL (ref 70–99)
Potassium: 5.4 mmol/L — ABNORMAL HIGH (ref 3.5–5.1)
Sodium: 130 mmol/L — ABNORMAL LOW (ref 135–145)
Total Bilirubin: 0.9 mg/dL (ref 0.3–1.2)
Total Protein: 9.1 g/dL — ABNORMAL HIGH (ref 6.5–8.1)

## 2020-04-20 LAB — CBC
HCT: 37.9 % (ref 36.0–46.0)
HCT: 40.1 % (ref 36.0–46.0)
Hemoglobin: 12.1 g/dL (ref 12.0–15.0)
Hemoglobin: 12.5 g/dL (ref 12.0–15.0)
MCH: 29.7 pg (ref 26.0–34.0)
MCH: 29.3 pg (ref 26.0–34.0)
MCHC: 31.9 g/dL (ref 30.0–36.0)
MCHC: 31.2 g/dL (ref 30.0–36.0)
MCV: 93.1 fL (ref 80.0–100.0)
MCV: 93.9 fL (ref 80.0–100.0)
Platelets: 283 10*3/uL (ref 150–400)
Platelets: 283 10*3/uL (ref 150–400)
RBC: 4.07 MIL/uL (ref 3.87–5.11)
RBC: 4.27 MIL/uL (ref 3.87–5.11)
RDW: 17.3 % — ABNORMAL HIGH (ref 11.5–15.5)
RDW: 17.7 % — ABNORMAL HIGH (ref 11.5–15.5)
WBC: 11.9 10*3/uL — ABNORMAL HIGH (ref 4.0–10.5)
WBC: 11.4 10*3/uL — ABNORMAL HIGH (ref 4.0–10.5)
nRBC: 0 % (ref 0.0–0.2)
nRBC: 0 % (ref 0.0–0.2)

## 2020-04-20 LAB — CBG MONITORING, ED
Glucose-Capillary: 191 mg/dL — ABNORMAL HIGH (ref 70–99)
Glucose-Capillary: 238 mg/dL — ABNORMAL HIGH (ref 70–99)
Glucose-Capillary: 398 mg/dL — ABNORMAL HIGH (ref 70–99)
Glucose-Capillary: 448 mg/dL — ABNORMAL HIGH (ref 70–99)
Glucose-Capillary: 535 mg/dL (ref 70–99)
Glucose-Capillary: 588 mg/dL (ref 70–99)
Glucose-Capillary: >600 mg/dL (ref 70–99)

## 2020-04-20 LAB — I-STAT BETA HCG BLOOD, ED (MC, WL, AP ONLY): I-stat hCG, quantitative: 5.8 m[IU]/mL — ABNORMAL HIGH (ref <5)

## 2020-04-20 LAB — I-STAT VENOUS BLOOD GAS, ED
Acid-Base Excess: 5 mmol/L — ABNORMAL HIGH (ref 0.0–2.0)
Bicarbonate: 29.5 mmol/L — ABNORMAL HIGH (ref 20.0–28.0)
Calcium, Ion: 0.85 mmol/L — CL (ref 1.15–1.40)
HCT: 42 % (ref 36.0–46.0)
Hemoglobin: 14.3 g/dL (ref 12.0–15.0)
O2 Saturation: 98 %
Potassium: 5.4 mmol/L — ABNORMAL HIGH (ref 3.5–5.1)
Sodium: 127 mmol/L — ABNORMAL LOW (ref 135–145)
TCO2: 31 mmol/L (ref 22–32)
pCO2, Ven: 43.4 mmHg — ABNORMAL LOW (ref 44.0–60.0)
pH, Ven: 7.439 — ABNORMAL HIGH (ref 7.250–7.430)
pO2, Ven: 97 mmHg — ABNORMAL HIGH (ref 32.0–45.0)

## 2020-04-20 LAB — HEMOGLOBIN A1C
Hgb A1c MFr Bld: 9.2 % — ABNORMAL HIGH (ref 4.8–5.6)
Mean Plasma Glucose: 217.34 mg/dL

## 2020-04-20 LAB — TROPONIN I (HIGH SENSITIVITY)
Troponin I (High Sensitivity): 31 ng/L — ABNORMAL HIGH (ref <18)
Troponin I (High Sensitivity): 35 ng/L — ABNORMAL HIGH (ref <18)

## 2020-04-20 LAB — SARS CORONAVIRUS 2 BY RT PCR (HOSPITAL ORDER, PERFORMED IN ~~LOC~~ HOSPITAL LAB): SARS Coronavirus 2: NEGATIVE

## 2020-04-20 LAB — BETA-HYDROXYBUTYRIC ACID: Beta-Hydroxybutyric Acid: 1.28 mmol/L — ABNORMAL HIGH (ref 0.05–0.27)

## 2020-04-20 LAB — LIPASE, BLOOD: Lipase: 53 U/L — ABNORMAL HIGH (ref 11–51)

## 2020-04-20 MED ORDER — ONDANSETRON HCL 4 MG/2ML IJ SOLN
4.0000 mg | Freq: Once | INTRAMUSCULAR | Status: AC
  Administered 2020-04-20: 4 mg via INTRAVENOUS
  Filled 2020-04-20: qty 2

## 2020-04-20 MED ORDER — PANTOPRAZOLE SODIUM 40 MG IV SOLR
40.0000 mg | Freq: Once | INTRAVENOUS | Status: AC
  Administered 2020-04-20: 40 mg via INTRAVENOUS
  Filled 2020-04-20: qty 40

## 2020-04-20 MED ORDER — DEXTROSE IN LACTATED RINGERS 5 % IV SOLN: INTRAVENOUS | Status: DC

## 2020-04-20 MED ORDER — HEPARIN SODIUM (PORCINE) 5000 UNIT/ML IJ SOLN
5000.0000 [IU] | Freq: Three times a day (TID) | INTRAMUSCULAR | Status: DC
  Administered 2020-04-20 – 2020-04-23 (×7): 5000 [IU] via SUBCUTANEOUS
  Filled 2020-04-20 (×7): qty 1

## 2020-04-20 MED ORDER — POTASSIUM CHLORIDE 10 MEQ/100ML IV SOLN
10.0000 meq | INTRAVENOUS | Status: AC
  Administered 2020-04-20 – 2020-04-21 (×2): 10 meq via INTRAVENOUS
  Filled 2020-04-20 (×2): qty 100

## 2020-04-20 MED ORDER — METOPROLOL TARTRATE 5 MG/5ML IV SOLN: 5.0000 mg | Freq: Once | INTRAVENOUS | Status: DC

## 2020-04-20 MED ORDER — LACTATED RINGERS IV SOLN: INTRAVENOUS | Status: DC

## 2020-04-20 MED ORDER — DEXTROSE 50 % IV SOLN: 0.0000 mL | INTRAVENOUS | Status: DC | PRN

## 2020-04-20 MED ORDER — INSULIN REGULAR(HUMAN) IN NACL 100-0.9 UT/100ML-% IV SOLN
INTRAVENOUS | Status: AC
  Administered 2020-04-20: 10.5 [IU]/h via INTRAVENOUS
  Filled 2020-04-20: qty 100

## 2020-04-20 MED ORDER — INSULIN REGULAR(HUMAN) IN NACL 100-0.9 UT/100ML-% IV SOLN
INTRAVENOUS | Status: DC
  Filled 2020-04-20: qty 100

## 2020-04-20 MED ORDER — HYDROMORPHONE HCL 1 MG/ML IJ SOLN
1.0000 mg | INTRAMUSCULAR | Status: AC | PRN
  Administered 2020-04-20 – 2020-04-21 (×3): 1 mg via INTRAVENOUS
  Filled 2020-04-20 (×3): qty 1

## 2020-04-20 MED ORDER — LACTATED RINGERS IV BOLUS: 20.0000 mL/kg | Freq: Once | INTRAVENOUS | Status: DC

## 2020-04-20 MED ORDER — HYDROMORPHONE HCL 1 MG/ML IJ SOLN
1.0000 mg | Freq: Once | INTRAMUSCULAR | Status: AC
  Administered 2020-04-20: 1 mg via INTRAVENOUS
  Filled 2020-04-20: qty 1

## 2020-04-20 MED ORDER — MORPHINE SULFATE (PF) 4 MG/ML IV SOLN
4.0000 mg | Freq: Once | INTRAVENOUS | Status: AC
Start: 2020-04-20 — End: 2020-04-20
  Administered 2020-04-20: 4 mg via INTRAVENOUS
  Filled 2020-04-20: qty 1

## 2020-04-20 MED ORDER — PROMETHAZINE HCL 25 MG/ML IJ SOLN
12.5000 mg | Freq: Four times a day (QID) | INTRAMUSCULAR | Status: DC | PRN
  Administered 2020-04-22 – 2020-04-23 (×2): 12.5 mg via INTRAVENOUS
  Filled 2020-04-20 (×2): qty 1

## 2020-04-20 MED ORDER — HYDRALAZINE HCL 20 MG/ML IJ SOLN
5.0000 mg | Freq: Once | INTRAMUSCULAR | Status: AC
  Administered 2020-04-20: 5 mg via INTRAVENOUS
  Filled 2020-04-20: qty 1

## 2020-04-20 NOTE — ED Notes (Signed)
CBG checked. Endo tool states insulin infusion to increase to 7.5 units/hr.

## 2020-04-20 NOTE — ED Notes (Signed)
Gave pt ice chips with provider okay. Insulin rate changed. Hospitalist at bedside.

## 2020-04-20 NOTE — ED Notes (Signed)
CBG checked. Endo tool states to continue same med admin rate. Pt req acid reflux medicine and ice chips. Will alert provider.

## 2020-04-20 NOTE — ED Notes (Signed)
Awaiting IV team for second PIV placement due to multiple IV infusions. Pt a difficult stick. Multiple attempts made on RT arm.

## 2020-04-20 NOTE — H&P (Signed)
History and Physical    [name] [id number] DOB: 1985-04-03 DOA: 04/20/2020  PCP: [name], MD   Patient coming from: Home  Chief Complaint: Abdominal pain, nausea, vomiting, elevated blood sugar  HPI: [name] is a 35 y.o. female with medical history significant for DMT1, hypertension, hyperlipidemia, ESRD on dialysis (last HD was 2 days ago) who presents with nausea, vomiting and abdominal pain for the past 2 days. She also states her blood sugars have been high the past 2 days. Patient states that she had right femoral dialysis catheter replaced 2 days ago and had her dialysis session. She states that since then she has been having abdominal pain and vomiting. She was unable to keep anything down. She states that she did give herself a shot of 15 units of insulin yesterday. However she has not been checking her sugars regularly but her sugar was high when she did check it and took her last dose of insulin. She was noted to be hyperglycemic with a sugar over 600 when she arrived. Patient is complaining of diffuse abdominal pain. She has not had fever, diarrhea, cough, SOB. She denies dysuria. She has not had any trauma or injury to her abdomen.   ED Course: She is found to have blood sugar over 600.  He is not acidotic on ABG.  Beta hydroxybutyrate level is elevated.  She is started on insulin infusion in the emergency room.  CT the abdomen was obtained and no renal stones were noted.  Right femoral vein catheter was in good position with no signs of perforation or free air.  Hospitalist service was asked to admit for further management  Review of Systems:  General: Denies weakness, fever, weight loss, night sweats.  Denies dizziness. Reports decreased appetite HENT: Denies head trauma, headache, denies change in hearing, tinnitus.  Denies nasal congestion or bleeding.  Denies sore throat, sores in mouth.  Denies difficulty swallowing Eyes: Denies blurry vision, pain in eye,  drainage.  Denies discoloration of eyes. Neck: Denies pain.  Denies swelling.  Denies pain with movement. Cardiovascular: Denies chest pain, palpitations.  Denies edema.  Denies orthopnea Respiratory: Denies shortness of breath, cough.  Denies wheezing.  Denies sputum production Gastrointestinal: Reports abdominal pain, nausea, vomiting. Reports reflux. Denies diarrhea.  Denies melena.  Denies hematemesis. Musculoskeletal: Denies limitation of movement.  Denies deformity or swelling.  Denies pain.  Denies arthralgias or myalgias. Genitourinary: Denies pelvic pain.  Denies urinary frequency or hesitancy.  Denies dysuria.  Skin: Denies rash.  Denies petechiae, purpura, ecchymosis. Neurological: Denies headache.  Denies syncope.  Denies seizure activity.  Denies paresthesia.  Denies slurred speech, drooping face.  Denies visual change. Psychiatric: Denies depression, anxiety. Denies hallucinations.  Past Medical History:  Diagnosis Date  . Anemia   . Chronic kidney disease    kidney transplant 07  . Diabetes mellitus    Pt reports diagnosis in June 2011, Type 2  . GERD (gastroesophageal reflux disease)   . Hyperlipidemia   . Hypertension   . 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 Pyelonephritis 06/23/2014  . Seasonal allergies   . UTI (urinary tract infection) 01/09/2015  . 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 ARTERIOVENOUS GRAFT PLACEMENT Bilateral    "  neither work" (10/24/2017)  . AV FISTULA PLACEMENT Left 10/26/2018   Procedure: CREATION OF ARTERIOVENOUS FISTULA  LEFT ARM;  Surgeon: [name], MD;  Location: [place];  Service: Vascular;  Laterality: Left;  . BASCILIC VEIN TRANSPOSITION Left 12/21/2018   Procedure: Left arm BASILIC VEIN TRANSPOSITION  SECOND STAGE;  Surgeon: [name], MD;  Location: [facility];  Service: Vascular;  Laterality: Left;  . CHOLECYSTECTOMY N/A 06/30/2017   Procedure: LAPAROSCOPIC CHOLECYSTECTOMY WITH INTRAOPERATIVE CHOLANGIOGRAM;  Surgeon: Excell Seltzer, MD;  Location: [facility];  Service: General;  Laterality: N/A;  . ESOPHAGOGASTRODUODENOSCOPY (EGD) WITH PROPOFOL N/A 07/04/2017   Procedure: ESOPHAGOGASTRODUODENOSCOPY (EGD) WITH PROPOFOL;  Surgeon: Clarene Essex, MD;  Location: [facility];  Service: Endoscopy;  Laterality: N/A;  . INSERTION OF DIALYSIS CATHETER N/A 03/20/2018   Procedure: INSERTION OF TUNNELED DIALYSIS CATHETER - RIGHT INTERANL JUGULAR PLACEMENT;  Surgeon: Angelia Mould, MD;  Location: [place];  Service: Vascular;  Laterality: N/A;  . IR FLUORO GUIDE CV LINE RIGHT  04/18/2020  . IR GUIDED DRAIN W CATHETER PLACEMENT  10/28/2018  . KIDNEY TRANSPLANT  2007  . PARATHYROIDECTOMY  ?2012   "3/4 removed" (10/24/2017)  . RENAL BIOPSY Bilateral 2003  . UPPER EXTREMITY VENOGRAPHY Bilateral 10/19/2018   Procedure: UPPER EXTREMITY VENOGRAPHY;  Surgeon: [name], MD;  Location: [place] [facility];  Service: Cardiovascular;  Laterality: Bilateral;  Bilateral     Social History  reports that she has never smoked. She has never used smokeless tobacco. She reports that she does not drink alcohol and does not use drugs.  Allergies  Allergen Reactions  . Benadryl [Diphenhydramine-Zinc Acetate] Anaphylaxis    Stopped breathing in ICU  . Diphenhydramine Anaphylaxis  . Doxycycline Shortness Of Breath    Other reaction(s): Other (See Comments) Shortness of Breath  Shortness of Breath  Shortness of Breath   . Motrin [Ibuprofen] Shortness Of Breath and Itching    Per pt  . Shellfish-Derived Products     hives  . Shellfish Allergy Hives  . Banana Other (See Comments), Itching and Nausea And Vomiting    Sick on the stomach Other reaction(s): Other (See Comments) Sick on the stomach Sick  on the stomach  . Chlorhexidine Itching  . Ferrous Sulfate Itching  . Iron Dextran Other (See Comments) and Itching    Vein irritation REACTION: vein irritation REACTION: vein irritation REACTION: vein irritation REACTION: vein irritation    Family History  Problem Relation Age of Onset  . Arthritis Mother   . Hypertension Mother   . Aneurysm Mother        died of brain aneurysm  . CAD Father        Has 3 stents  . Diabetes Father        borderline  . Early death Brother        Died in war     Prior to Admission medications   Medication Sig Start Date End Date Taking? Authorizing Provider  acetaminophen (TYLENOL) 500 MG tablet Take 1,000 mg by mouth every 6 (six) hours as needed for headache (pain).    Yes [provider]  Calcium Carbonate Antacid (CALCIUM CARBONATE, DOSED IN MG ELEMENTAL CALCIUM,) 1250 MG/5ML SUSP Take 5 mLs by mouth 3 (three) times daily.  11/17/18  Yes [provider]  D 1000 25 MCG (1000 UT) capsule Take 1,000 Units by mouth every other day.  04/27/19  Yes [provider]  gabapentin (NEURONTIN) 400 MG capsule Take 400 mg  by mouth 3 (three) times daily. 04/29/19  Yes [provider]  hydrOXYzine (ATARAX/VISTARIL) 50 MG tablet Take 1 tablet (50 mg total) by mouth 2 (two) times daily as needed for itching. Patient taking differently: Take 50 mg by mouth 2 (two) times daily as needed for itching. 02/06/18  Yes [name], Ripudeep K, MD  insulin aspart (NOVOLOG) 100 UNIT/ML FlexPen Inject 2-10 Units into the skin 3 (three) times daily with meals. as needed for blood sugar management (sliding scale)   Yes [provider]  insulin degludec (TRESIBA) 100 UNIT/ML FlexTouch Pen Inject 20 Units into the skin daily. 02/15/20  Yes Dwyane Dee, MD  lidocaine-prilocaine (EMLA) cream Apply 1 application topically See admin instructions. Apply topically one hour prior to dialysis on Monday, Wednesday, Friday 09/24/19  Yes [provider]  loperamide (IMODIUM) 1 MG/5ML solution Take 2 mg by mouth 2 (two) times daily as needed for diarrhea or loose stools.    Yes [provider]  metoCLOPramide (REGLAN) 10 MG tablet Take 10 mg by mouth 3 (three) times daily. 04/19/20  Yes [provider]  metoprolol tartrate (LOPRESSOR) 25 MG tablet Take 1 tablet (25 mg total) by mouth 2 (two) times daily. 02/15/20  Yes Dwyane Dee, MD  oxyCODONE-acetaminophen (PERCOCET/ROXICET) 5-325 MG tablet Take 1 tablet by mouth 2 (two) times daily as needed for moderate pain. 04/17/20  Yes [provider]  pantoprazole (PROTONIX) 40 MG tablet Take 40 mg by mouth 2 (two) times daily.    Yes [provider]  promethazine (PHENERGAN) 25 MG tablet Take 25 mg by mouth every 6 (six) hours as needed for nausea or vomiting.  10/13/19  Yes [provider]  simvastatin (ZOCOR) 20 MG tablet Take 20 mg by mouth at bedtime.    Yes [provider]  ACCU-CHEK SOFTCLIX LANCETS lancets Use to check blood sugar 4 times per day. 12/29/15   Renato Shin, MD  amLODipine (NORVASC) 5 MG tablet Take 1 tablet (5 mg total) by mouth daily. Patient not taking: Reported on 04/20/2020 02/16/20   Dwyane Dee, MD  cetirizine (ZYRTEC) 10 MG tablet Take 10 mg by mouth daily as needed for allergies.    [provider]  fluticasone (FLONASE) 50 MCG/ACT nasal spray Place 2 sprays into both nostrils daily as needed for allergies. 09/03/18   [name], MD  Hyoscyamine Sulfate SL (LEVSIN/SL) 0.125 MG SUBL Place 1 each under the tongue 4 (four) times daily as needed for up to 5 days. 02/17/20 02/22/20  Fatima Blank, MD  lidocaine (XYLOCAINE) 2 % solution Use as directed 15 mLs in the mouth or throat every 6 (six) hours as needed for mouth pain. 02/17/20   Cardama, Grayce Sessions, MD  Methoxy PEG-Epoetin Beta (MIRCERA IJ) Mircera 07/23/19 07/21/20  [provider]  metoCLOPramide (REGLAN) 5 MG tablet Take 1  tablet (5 mg total) by mouth 3 (three) times daily before meals. Patient not taking: Reported on 04/20/2020 02/15/20   Dwyane Dee, MD    Physical Exam: Vitals:   04/20/20 1926 04/20/20 2012 04/20/20 2015 04/20/20 2030  BP: (!) 188/119 (!) 179/113 (!) 179/113   Pulse: (!) 106  100 (!) 106  Resp: (!) 9  14 (!) 22  Temp: 98.5 F (36.9 C)     TempSrc: Oral     SpO2: 98%  100% 100%    Constitutional: NAD, calm, comfortable Vitals:   04/20/20 1926 04/20/20 2012 04/20/20 2015 04/20/20 2030  BP: (!) 188/119 (!) 179/113 Marland Kitchen)  179/113   Pulse: (!) 106  100 (!) 106  Resp: (!) 9  14 (!) 22  Temp: 98.5 F (36.9 C)     TempSrc: Oral     SpO2: 98%  100% 100%   General: WDWN, Alert and oriented x3.  Eyes: EOMI, PERRL, conjunctivae normal.  Sclera nonicteric HENT:  [place]/AT, external ears normal.  Nares patent without epistasis.  Mucous membranes are dry. Posterior pharynx clear of any exudate or lesions.  Neck: Soft, normal range of motion, supple, no masses, no thyromegaly.  Trachea midline Respiratory: clear to auscultation bilaterally, no wheezing, no crackles. Normal respiratory effort. No accessory muscle use.  Cardiovascular:  Sinus tachycardia, no murmurs / rubs / gallops. No extremity edema. 2+ pedal pulses.  Abdomen: Soft, Diffuse abdominal tenderness, nondistended, no rebound or guarding. Obese.  No masses palpated. Hypoactive bowel sounds  Musculoskeletal: FROM. no  cyanosis. No joint deformity upper and lower extremities.  Normal muscle tone.  Skin: Warm, dry, intact no rashes, lesions, ulcers. No induration Neurologic: CN 2-12 grossly intact.  Normal speech.  Sensation intact. Strength 5/5 in all extremities.   Psychiatric: Normal judgment and insight.  Normal mood.    Labs on Admission: I have personally reviewed following labs and imaging studies  CBC: Recent Labs  Lab 04/20/20 1748 04/20/20 1951  WBC 11.9*  --   HGB 12.1 14.3  HCT 37.9 42.0  MCV 93.1  --   PLT 283  --      Basic Metabolic Panel: Recent Labs  Lab 04/20/20 1748 04/20/20 1951  NA 130* 127*  K 5.4* 5.4*  CL 79*  --   CO2 22  --   GLUCOSE 680*  --   BUN 84*  --   CREATININE 10.90*  --   CALCIUM 8.3*  --     GFR: Estimated Creatinine Clearance: 7.8 mL/min (A) (by C-G formula based on SCr of 10.9 mg/dL (H)).  Liver Function Tests: Recent Labs  Lab 04/20/20 1748  AST 14*  ALT <5  ALKPHOS 57  BILITOT 0.9  PROT 9.1*  ALBUMIN 3.6    Urine analysis:    Component Value Date/Time   COLORURINE RED (A) 09/03/2018 1118   APPEARANCEUR CLOUDY (A) 09/03/2018 1118   LABSPEC 1.015 09/03/2018 1118   PHURINE 8.0 09/03/2018 1118   GLUCOSEU 250 (A) 09/03/2018 1118   HGBUR LARGE (A) 09/03/2018 1118   HGBUR negative 01/26/2010 1026   BILIRUBINUR NEGATIVE 09/03/2018 1118   BILIRUBINUR NEG 01/01/2011 [place] 09/03/2018 1118   PROTEINUR >300 (A) 09/03/2018 1118   UROBILINOGEN 0.2 01/09/2015 1333   NITRITE POSITIVE (A) 09/03/2018 1118   LEUKOCYTESUR TRACE (A) 09/03/2018 1118    Radiological Exams on Admission: DG Chest 2 View  Result Date: 04/20/2020 CLINICAL DATA:  Chest pain EXAM: CHEST - 2 VIEW COMPARISON:  February 12, 2020 FINDINGS: The cardiomediastinal silhouette is unchanged and mildly enlarged in contour.Lower extremity CVC tip terminates over the inferior RIGHT atrium. No pleural effusion. No pneumothorax. Mild interstitial prominence without overt edema. Surgical clips project over the upper abdomen. No acute osseous abnormality IMPRESSION: Mild interstitial prominence without overt edema. Electronically Signed   By: Valentino Saxon MD   On: 04/20/2020 18:27   CT Renal Stone Study  Result Date: 04/20/2020 CLINICAL DATA:  Flank pain.  Right lower quadrant pain. EXAM: CT ABDOMEN AND PELVIS WITHOUT CONTRAST TECHNIQUE: Multidetector CT imaging of the abdomen and pelvis was performed following the standard protocol without IV contrast. COMPARISON:  October 14, 2019  FINDINGS: Lower chest: The lung bases are clear. The heart size is normal. Hepatobiliary: The liver is normal. Status post cholecystectomy.There is no biliary ductal dilation. Pancreas: Normal contours without ductal dilatation. No peripancreatic fluid collection. Spleen: Unremarkable. Adrenals/Urinary Tract: --Adrenal glands: Unremarkable. --Right kidney/ureter: The right kidney is not visualized. --Left kidney/ureter: The left kidney is atrophic. --Urinary bladder: The urinary bladder is decompressed and therefore cannot be fully evaluated. Stomach/Bowel: --Stomach/Duodenum: There is a moderate-sized hiatal hernia. --Small bowel: Unremarkable. --Colon: Unremarkable. --Appendix: Normal. Vascular/Lymphatic: Normal course and caliber of the major abdominal vessels. There is a right femoral approach dialysis catheter with tip terminating near the cavoatrial junction. There is fat stranding in the right inguinal region about the right common femoral vein and artery. --No retroperitoneal lymphadenopathy. --No mesenteric lymphadenopathy. --there are few mildly enlarged right inguinal lymph nodes. Reproductive: Normal prostate and seminal vesicles. Other: There postsurgical changes in the right lower quadrant likely related to prior renal transplant. The abdominal wall is normal. Musculoskeletal. No acute displaced fractures. IMPRESSION: 1. There is a well-positioned right femoral approach tunneled dialysis catheter. There is fat stranding in the right inguinal region about the right common femoral vein and artery. Findings are nonspecific and may be related to recent catheter placement versus a soft tissue infection or venous thrombosis. This is not well evaluated in the absence of IV contrast. 2. Normal appendix in the right lower quadrant. 3. Additional chronic findings as detailed above. Electronically Signed   By: Constance Holster M.D.   On: 04/20/2020 21:05    EKG: Independently reviewed.  EKG shows sinus  tachycardia with nonspecific ST changes.  No ST elevation or depression.  Prolonged QTC of 510  Assessment/Plan Principal Problem:   Type 1 diabetes mellitus with hyperosmolar hyperglycemic state (HHS)  [name] is admitted to [facility] floor on HHS/DKA protocol.  She has been started on insulin infusion.  Blood sugars will be monitored every hour.  She is started on IV fluid hydration with LR.  Will be converted to D5 LR when blood sugar is less than 250. Check hemoglobin A1c  Active Problems:   Essential hypertension, benign We will monitor blood pressure.  IV metoprolol will be provided while patient is n.p.o. and not able to tolerate p.o. intake    Intractable nausea and vomiting  Phenergan provided IV for nausea and vomiting.  Get n.p.o. until nausea and vomiting improves and blood sugar is controlled    ESRD on hemodialysis  Nephrology consulted by ER provider and will see pt in am to arrange for HD needs.    Hyponatremia IVF hydration provided. Monitor electrolytes every 4 hours.     GERD (gastroesophageal reflux disease) Patient be given Protonix IV night.  Resume p.o. Protonix when able to tolerate diet    Abdominal pain Dilaudid provided IV overnight for severe abdominal pain as needed for 3 doses.    Prolonged QT interval Avoid medications which could further prolong QT    OBESITY Follow-up with PCP for dietary, lifestyle, pharmacotherapy interventions for weight loss    DVT prophylaxis: Heparin for DVT prophylaxis Code Status:   Full Code  Family Communication:  Diagnosis and plan discussed with patient her family is at bedside.  Questions answered.  They agree with plan.  Further recommendations to follow as clinically indicated Disposition Plan:   Patient is from:  Home  Anticipated DC to:  Home  Anticipated DC date:  Anticipate more than 2 midnight stay in the hospital to  treat your condition  Anticipated DC barriers: No barriers to discharge identified at this  time  Consult:   Nephrology consulted by ER provider and will see pt in am Admission status:  Inpatient  Yevonne Aline Jomel Whittlesey MD [facility]  How to contact the [facility] Attending or Consulting provider [place] or covering provider during after hours [place], for this patient?   1. Check the care team in [facility] and look for a) attending/consulting [facility] provider listed and b) the [facility] team listed 2. Log into [email or web address] and use [place]'s universal password to access. If you do not have the password, please contact the hospital operator. 3. Locate the [facility] provider you are looking for under [facility] and page to a number that you can be directly reached. 4. If you still have difficulty reaching the provider, please page the [facility] (Director on Call) for the Hospitalists listed on amion for assistance.  04/20/2020, 9:50 PM

## 2020-04-20 NOTE — ED Notes (Signed)
Patient transported to CT 

## 2020-04-20 NOTE — ED Notes (Signed)
Dr. Maryan Rued notified of glucose 680.

## 2020-04-20 NOTE — ED Provider Notes (Addendum)
[facility] EMERGENCY DEPARTMENT Provider Note   CSN: [id number] Arrival date & time: 04/20/20  1722     History Chief Complaint  Patient presents with  . Abdominal Pain  . Chest Pain    [name] is a 35 y.o. female hx of CKD, DM, hypertension, hyperlipidemia, ESRD on dialysis (last HD was 2 days ago), here presenting with vomiting and abdominal pain and chest pain and hyperglycemia. Patient states that she had right femoral dialysis catheter replaced 2 days ago. She did have dialysis beforehand. She states that since that she has been having abdominal pain and vomiting. She was unable to keep anything down. She states that she did give herself a shot of 15 units of insulin yesterday. However she has not been checking her sugars. She also has some associated chest pain as well. She was noted to be hyperglycemic with a sugar over 600 initially. Patient is complaining of diffuse abdominal pain  The history is provided by the patient.       Past Medical History:  Diagnosis Date  . Anemia   . Chronic kidney disease    kidney transplant 07  . Diabetes mellitus    Pt reports diagnosis in June 2011, Type 2  . GERD (gastroesophageal reflux disease)   . Hyperlipidemia   . Hypertension   . 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 Pyelonephritis 06/23/2014  . Seasonal allergies   . UTI (urinary tract infection) 01/09/2015  . 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 Wide-complex tachycardia ([place])   . Hypotension 10/16/2019  . Pain in the abdomen 10/14/2019  . Hyperkalemia 10/11/2019  . Hypertensive urgency 10/11/2019  . Hyperglycemia 10/11/2019  . Intractable abdominal pain 06/19/2019  . Sinus tachycardia 06/19/2019  . Intra-abdominal  abscess ([place]) 10/28/2018  . Hypertensive emergency 09/01/2018  . DKA (diabetic ketoacidosis) ([place]) 05/01/2018  . ESRD on hemodialysis ([place]) 05/01/2018  . Renal failure 03/18/2018  . Cellulitis of left leg 03/01/2018  . Hyperlipidemia 03/01/2018  . Hypocalcemia 03/01/2018  . Metabolic acidosis 99991111  . Hypokalemia 02/03/2018  . Incontinence of bowel 02/03/2018  . Chronic pain 11/11/2017  . Diabetic gastroparesis ([place])   . Constipation 07/13/2017  . Hematemesis 07/02/2017  . Dehydration 07/02/2017  . Chronic cholecystitis 06/29/2017  . Type 1 diabetes mellitus with complication, uncontrolled ([place]) 05/25/2015  . Intractable nausea and vomiting 01/09/2015  . Renal transplant recipient   . Immunosuppressed status ([place])   . Chest pain 12/22/2012  . 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 Chronic kidney disease 01/04/2010  . Chronic kidney disease, stage II (mild) 01/04/2010  . OVARIAN FAILURE, PREMATURE 03/11/2009  . 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 OBESITY 09/25/2007  . Anemia due to chronic kidney disease 09/25/2007  . LEARNING DISABILITY 09/25/2007  . Essential hypertension, benign 09/25/2007    Past Surgical History:  Procedure Laterality Date  . ARTERIOVENOUS GRAFT PLACEMENT Bilateral    "neither work" (10/24/2017)  . AV FISTULA PLACEMENT Left 10/26/2018   Procedure: CREATION OF ARTERIOVENOUS FISTULA  LEFT ARM;  Surgeon: [name], MD;  Location: [facility];  Service: Vascular;  Laterality: Left;  .  BASCILIC VEIN TRANSPOSITION Left 12/21/2018   Procedure: Left arm BASILIC VEIN TRANSPOSITION SECOND STAGE;  Surgeon: [name], MD;  Location: [facility];  Service: Vascular;  Laterality: Left;  . CHOLECYSTECTOMY N/A 06/30/2017   Procedure: LAPAROSCOPIC CHOLECYSTECTOMY WITH INTRAOPERATIVE CHOLANGIOGRAM;  Surgeon: Excell Seltzer, MD;  Location: [facility];  Service: General;  Laterality: N/A;  . ESOPHAGOGASTRODUODENOSCOPY (EGD) WITH PROPOFOL N/A 07/04/2017   Procedure: ESOPHAGOGASTRODUODENOSCOPY (EGD) WITH PROPOFOL;  Surgeon: Clarene Essex, MD;  Location: [facility];  Service: Endoscopy;  Laterality: N/A;  . INSERTION OF DIALYSIS CATHETER N/A 03/20/2018   Procedure: INSERTION OF TUNNELED DIALYSIS CATHETER - RIGHT INTERANL JUGULAR PLACEMENT;  Surgeon: Angelia Mould, MD;  Location: [facility];  Service: Vascular;  Laterality: N/A;  . IR FLUORO GUIDE CV LINE RIGHT  04/18/2020  . IR GUIDED DRAIN W CATHETER PLACEMENT  10/28/2018  . KIDNEY TRANSPLANT  2007  . PARATHYROIDECTOMY  ?2012   "3/4 removed" (10/24/2017)  . RENAL BIOPSY Bilateral 2003  . UPPER EXTREMITY VENOGRAPHY Bilateral 10/19/2018   Procedure: UPPER EXTREMITY VENOGRAPHY;  Surgeon: [name], MD;  Location: [facility];  Service: Cardiovascular;  Laterality: Bilateral;  Bilateral      OB History    Gravida  0   Para  0   Term  0   Preterm  0   AB  0   Living  0     SAB  0   IAB  0   Ectopic  0   Multiple  0   Live Births  0           Family History  Problem Relation Age of Onset  . Arthritis Mother   . Hypertension Mother   . Aneurysm Mother        died of brain aneurysm  . CAD Father        Has 3 stents  . Diabetes Father        borderline  . Early death Brother        Died in war    Social History   Tobacco Use  . Smoking status: Never Smoker  . Smokeless tobacco: Never Used  Vaping Use  . Vaping Use: Never used  Substance Use Topics  . Alcohol use: No  . Drug use: No    Home Medications Prior to Admission medications   Medication Sig Start Date End Date Taking? Authorizing Provider  ACCU-CHEK SOFTCLIX LANCETS lancets Use to check blood sugar 4 times per day. 12/29/15   Renato Shin, MD  acetaminophen (TYLENOL) 500 MG tablet Take 1,000 mg by mouth every 6 (six) hours as needed for headache (pain).     [provider]  amLODipine (NORVASC) 5 MG tablet Take 1 tablet (5 mg total) by mouth daily. 02/16/20   Dwyane Dee, MD  calcium acetate (PHOSLO) 667 MG capsule Take [phone] mg by mouth See admin instructions. Take 2 capsules (1334 mg) by mouth up to 4 times daily with meals, take 1 capsule (667 mg) with snacks 11/24/18   [provider]  Calcium Carbonate Antacid (CALCIUM CARBONATE, DOSED IN MG ELEMENTAL CALCIUM,) 1250 MG/5ML SUSP Take 5 mLs by mouth 3 (three) times daily.  11/17/18   [provider]  cetirizine (ZYRTEC) 10 MG tablet Take 10 mg by mouth daily as needed for allergies.    [provider]  D 1000 25 MCG (1000 UT) capsule Take 1,000 Units by mouth every other day.  04/27/19   [provider]  diclofenac Sodium (VOLTAREN) 1 % GEL Apply 2 g topically 4 (four) times daily.    [provider]  fluticasone (FLONASE) 50 MCG/ACT nasal spray Place 2 sprays into both nostrils daily as needed for allergies. 09/03/18   [name], MD  gabapentin (NEURONTIN) 400 MG capsule Take 400 mg by mouth 3 (three) times daily. 04/29/19   [provider]  hydrOXYzine (ATARAX/VISTARIL) 50 MG tablet Take 1 tablet (50 mg total) by mouth 2 (two) times daily as needed for itching. Patient taking differently: Take 50 mg by mouth 2 (two) times daily as needed for itching. 02/06/18   [name], [name], MD  Hyoscyamine Sulfate SL (LEVSIN/SL) 0.125 MG SUBL Place 1 each under the tongue 4 (four) times daily as needed for up to 5 days. 02/17/20 02/22/20  Fatima Blank, MD  insulin aspart (NOVOLOG) 100 UNIT/ML FlexPen Inject 2-10 Units into the skin 3 (three) times daily with meals. as needed for blood sugar management (sliding scale)    [provider]  insulin degludec (TRESIBA) 100 UNIT/ML FlexTouch Pen Inject 20 Units into the skin daily. 02/15/20   Dwyane Dee, MD  lidocaine (XYLOCAINE) 2 % solution Use as directed 15 mLs in the mouth or throat  every 6 (six) hours as needed for mouth pain. 02/17/20   Fatima Blank, MD  lidocaine-prilocaine (EMLA) cream Apply 1 application topically See admin instructions. Apply topically one hour prior to dialysis on Monday, Wednesday, Friday 09/24/19   [provider]  loperamide (IMODIUM) 1 MG/5ML solution Take 2 mg by mouth 2 (two) times daily as needed for diarrhea or loose stools.     [provider]  Methoxy PEG-Epoetin Beta (MIRCERA IJ) Mircera 07/23/19 07/21/20  [provider]  metoCLOPramide (REGLAN) 5 MG tablet Take 1 tablet (5 mg total) by mouth 3 (three) times daily before meals. 02/15/20   Dwyane Dee, MD  metoprolol tartrate (LOPRESSOR) 25 MG tablet Take 1 tablet (25 mg total) by mouth 2 (two) times daily. 02/15/20   Dwyane Dee, MD  pantoprazole (PROTONIX) 40 MG tablet Take 40 mg by mouth 2 (two) times daily.     [provider]  promethazine (PHENERGAN) 12.5 MG suppository Place 12.5 mg rectally 5 (five) times daily as needed for nausea or vomiting.     [provider]  promethazine (PHENERGAN) 25 MG tablet Take 25 mg by mouth every 6 (six) hours as needed for nausea or vomiting.  10/13/19   [provider]  simvastatin (ZOCOR) 20 MG tablet Take 20 mg by mouth at bedtime.     [provider]    Allergies    Benadryl [diphenhydramine-zinc acetate], Diphenhydramine, Doxycycline, Motrin [ibuprofen], Shellfish-derived products, Shellfish allergy, Banana, Chlorhexidine, Ferrous sulfate, and Iron dextran  Review of Systems   Review of Systems  Cardiovascular: Positive for chest pain.  Gastrointestinal: Positive for abdominal pain.  All other systems reviewed and are negative.   Physical Exam Updated Vital Signs BP (!) 179/113   Pulse (!) 106   Temp 98.5 F (36.9 C) (Oral)   Resp (!) 22   SpO2 100%   Physical Exam Vitals and nursing note reviewed.  Constitutional:      Comments: Uncomfortable, chronically ill.   HENT:     Head: Normocephalic.     Mouth/Throat:     Pharynx: Oropharynx is clear.  Eyes:     Extraocular Movements: Extraocular movements intact.     Pupils: Pupils are equal, round, and reactive  to light.  Cardiovascular:     Rate and Rhythm: Normal rate and regular rhythm.  Abdominal:     Comments: Previous midline surgical scar. Mild diffuse tenderness but worse in the epigastrium.  Skin:    Comments: Bilateral failed upper arm fistulas. She does have right femoral graft in place  Neurological:     General: No focal deficit present.     Mental Status: She is oriented to person, place, and time.  Psychiatric:        Mood and Affect: Mood normal.     ED Results / Procedures / Treatments   Labs (all labs ordered are listed, but only abnormal results are displayed) Labs Reviewed  LIPASE, BLOOD - Abnormal; Notable for the following components:      Result Value   Lipase 53 (*)    All other components within normal limits  COMPREHENSIVE METABOLIC PANEL - Abnormal; Notable for the following components:   Sodium 130 (*)    Potassium 5.4 (*)    Chloride 79 (*)    Glucose, Bld 680 (*)    BUN 84 (*)    Creatinine, Ser 10.90 (*)    Calcium 8.3 (*)    Total Protein 9.1 (*)    AST 14 (*)    GFR, Estimated 4 (*)    Anion gap 29 (*)    All other components within normal limits  CBC - Abnormal; Notable for the following components:   WBC 11.9 (*)    RDW 17.3 (*)    All other components within normal limits  CBG MONITORING, ED - Abnormal; Notable for the following components:   Glucose-Capillary >600 (*)    All other components within normal limits  I-STAT BETA HCG BLOOD, ED ([facility], [facility], [facility] ONLY) - Abnormal; Notable for the following components:   I-stat hCG, quantitative 5.8 (*)    All other components within normal limits  CBG MONITORING, ED - Abnormal; Notable for the following components:   Glucose-Capillary 588 (*)    All other components within normal limits  CBG  MONITORING, ED - Abnormal; Notable for the following components:   Glucose-Capillary 535 (*)    All other components within normal limits  I-STAT VENOUS BLOOD GAS, ED - Abnormal; Notable for the following components:   pH, Ven 7.439 (*)    pCO2, Ven 43.4 (*)    pO2, Ven 97.0 (*)    Bicarbonate 29.5 (*)    Acid-Base Excess 5.0 (*)    Sodium 127 (*)    Potassium 5.4 (*)    Calcium, Ion 0.85 (*)    All other components within normal limits  TROPONIN I (HIGH SENSITIVITY) - Abnormal; Notable for the following components:   Troponin I (High Sensitivity) 31 (*)    All other components within normal limits  SARS CORONAVIRUS 2 BY RT PCR (HOSPITAL ORDER, [facility])  URINALYSIS, ROUTINE W REFLEX MICROSCOPIC  BETA-HYDROXYBUTYRIC ACID  BETA-HYDROXYBUTYRIC ACID  TROPONIN I (HIGH SENSITIVITY)    EKG EKG Interpretation  Date/Time:  Sunday April 20 2020 17:33:32 EST Ventricular Rate:  103 PR Interval:  130 QRS Duration: 72 QT Interval:  390 QTC Calculation: 510 R Axis:   73 Text Interpretation: Sinus tachycardia Cannot rule out Anterior infarct , age undetermined Abnormal ECG No significant change since last tracing Confirmed by Wandra Arthurs [phone]) on 04/20/2020 7:41:51 PM   Radiology DG Chest 2 View  Result Date: 04/20/2020 CLINICAL DATA:  Chest pain EXAM: CHEST -  2 VIEW COMPARISON:  February 12, 2020 FINDINGS: The cardiomediastinal silhouette is unchanged and mildly enlarged in contour.Lower extremity CVC tip terminates over the inferior RIGHT atrium. No pleural effusion. No pneumothorax. Mild interstitial prominence without overt edema. Surgical clips project over the upper abdomen. No acute osseous abnormality IMPRESSION: Mild interstitial prominence without overt edema. Electronically Signed   By: Valentino Saxon MD   On: 04/20/2020 18:27   CT Renal Stone Study  Result Date: 04/20/2020 CLINICAL DATA:  Flank pain.  Right lower quadrant pain. EXAM: CT  ABDOMEN AND PELVIS WITHOUT CONTRAST TECHNIQUE: Multidetector CT imaging of the abdomen and pelvis was performed following the standard protocol without IV contrast. COMPARISON:  October 14, 2019 FINDINGS: Lower chest: The lung bases are clear. The heart size is normal. Hepatobiliary: The liver is normal. Status post cholecystectomy.There is no biliary ductal dilation. Pancreas: Normal contours without ductal dilatation. No peripancreatic fluid collection. Spleen: Unremarkable. Adrenals/Urinary Tract: --Adrenal glands: Unremarkable. --Right kidney/ureter: The right kidney is not visualized. --Left kidney/ureter: The left kidney is atrophic. --Urinary bladder: The urinary bladder is decompressed and therefore cannot be fully evaluated. Stomach/Bowel: --Stomach/Duodenum: There is a moderate-sized hiatal hernia. --Small bowel: Unremarkable. --Colon: Unremarkable. --Appendix: Normal. Vascular/Lymphatic: Normal course and caliber of the major abdominal vessels. There is a right femoral approach dialysis catheter with tip terminating near the cavoatrial junction. There is fat stranding in the right inguinal region about the right common femoral vein and artery. --No retroperitoneal lymphadenopathy. --No mesenteric lymphadenopathy. --there are few mildly enlarged right inguinal lymph nodes. Reproductive: Normal prostate and seminal vesicles. Other: There postsurgical changes in the right lower quadrant likely related to prior renal transplant. The abdominal wall is normal. Musculoskeletal. No acute displaced fractures. IMPRESSION: 1. There is a well-positioned right femoral approach tunneled dialysis catheter. There is fat stranding in the right inguinal region about the right common femoral vein and artery. Findings are nonspecific and may be related to recent catheter placement versus a soft tissue infection or venous thrombosis. This is not well evaluated in the absence of IV contrast. 2. Normal appendix in the right lower  quadrant. 3. Additional chronic findings as detailed above. Electronically Signed   By: Constance Holster M.D.   On: 04/20/2020 21:05    Procedures Procedures   CRITICAL CARE Performed by: Wandra Arthurs   Total critical care time: 30 minutes  Critical care time was exclusive of separately billable procedures and treating other patients.  Critical care was necessary to treat or prevent imminent or life-threatening deterioration.  Critical care was time spent personally by me on the following activities: development of treatment plan with patient and/or surrogate as well as nursing, discussions with consultants, evaluation of patient's response to treatment, examination of patient, obtaining history from patient or surrogate, ordering and performing treatments and interventions, ordering and review of laboratory studies, ordering and review of radiographic studies, pulse oximetry and re-evaluation of patient's condition.   Angiocath insertion Performed by: Wandra Arthurs  Consent: Verbal consent obtained. Risks and benefits: risks, benefits and alternatives were discussed Time out: Immediately prior to procedure a "time out" was called to verify the correct patient, procedure, equipment, support staff and site/side marked as required.  Preparation: Patient was prepped and draped in the usual sterile fashion.  Vein Location: L antecube  Ultrasound Guided  Gauge: 20 long   Normal blood return and flush without difficulty Patient tolerance: Patient tolerated the procedure well with no immediate complications.     Medications Ordered in  ED Medications  insulin regular, human (MYXREDLIN) 100 units/ 100 mL infusion (10.5 Units/hr Intravenous New Bag/Given 04/20/20 2017)  lactated ringers infusion ( Intravenous New Bag/Given 04/20/20 2019)  dextrose 5 % in lactated ringers infusion (has no administration in time range)  dextrose 50 % solution 0-50 mL (has no administration in time range)   morphine 4 MG/ML injection 4 mg (4 mg Intravenous Given 04/20/20 2008)  hydrALAZINE (APRESOLINE) injection 5 mg (5 mg Intravenous Given 04/20/20 2012)  ondansetron (ZOFRAN) injection 4 mg (4 mg Intravenous Given 04/20/20 2030)  HYDROmorphone (DILAUDID) injection 1 mg (1 mg Intravenous Given 04/20/20 2030)    ED Course  I have reviewed the triage vital signs and the nursing notes.  Pertinent labs & imaging results that were available during my care of the patient were reviewed by me and considered in my medical decision making (see chart for details).    MDM Rules/Calculators/A&P                         AINHARA BURRI is a 35 y.o. female here presenting with abdominal pain and vomiting. Patient's glucose is greater than 600. Concern for possible DKA. Plan to get CBC and CMP and lipase and VBG. Also get CT abdomen pelvis given abdominal tenderness.  9:10 PM Patient's anion gap is 29 which is elevated from previous.  Patient's pH is normal however.  CT abdomen pelvis did not show any perforation and the right femoral catheter is in the right place.  At this point, likely patient has hyperosmolar state.  Started on IV insulin.  Hospitalist to admit.  I notified Dr. Augustin Coupe from nephrology will dialyze patient tomorrow.  Final Clinical Impression(s) / ED Diagnoses Final diagnoses:  None    Rx / DC Orders ED Discharge Orders    None       Drenda Freeze, MD 04/20/20 2111    Drenda Freeze, MD 04/20/20 2118

## 2020-04-20 NOTE — Consult Note (Signed)
Reason for Consult: ESRD Referring Physician:  Dr. Shirlyn Goltz  Chief Complaint: Abdominal pain  HD [place] MonWedFri, 4 hrs 0 min, Optiflux 180NR, BFR 350, DFR Manual 800 mL/min, EDW 82.5 (kg), Dialysate 2.0 K, 3.5 Ca, 1.0 Mg, 100 Dextrose [id number]), Sodium 137 (mEq/L), Bicarb Setting: 35 (mEq/L), UFR Profile: Profile 4, Sodium Model: None, Access: CVCatheter-Tunneled  Heparin 4000 bolus, 2000 mid Mircera 225 q2weeks (last 04/07/20)   Assessment/Plan: 1. ESRD - Lowest post dialysis weight we've gotten in the past 2 weeks has been 83.1kg and she has been staying the full treatment. - Plan for dialysis tomorrow. No absolute indication tonight. - Potassium only mildly elevated and will decrease with cellular shift w/ insulin therapy.  2. Renal osteodystrophy - will check a phos but continue binders for now. 3. Anemia - @ goal (last rx Mircera 04/07/20) 4. DKA - on insulin gtt 5. H/o SVT during her last admission in 02/2020 initially on amio but then plan was for a 30 day even monitor and to continue BBlr. 6. HTN - resume home regimen and UF will help with RRT tomorrow. 7. Failed transplant   HPI: [name] is an 35 y.o. female DM HTN HLD ESRD MWF @ [facility] with Dr. Hollie Salk with h/o kidney transplant in 2007. She's here with vomiting, abdominal pain, chest pain and noted to be hyperglycemic in DKA. Currently dialyzing through a right femoral catheter replaced 2 days ago @ [facility]; she states that she's had excruciating pain since the exchange. She didn't eat in prep for the exchange and then started developing severe abdominal pain afterwards. Her last treatment was on Friday where she left 0.6kg above her presumed EDW. She gave herself 15 units of insulin yesterday but she does not check her FS's regularly. She was found to have a glucose of >600 initially in the ED.   ROS Pertinent items are noted in HPI.  Chemistry and CBC: Creatinine, Ser  Date/Time Value Ref Range Status  04/20/2020 05:48 PM  10.90 (H) 0.44 - 1.00 mg/dL Final  02/21/2020 02:32 AM 5.17 (H) 0.44 - 1.00 mg/dL Final  02/17/2020 04:04 AM 8.71 (H) 0.44 - 1.00 mg/dL Final  02/15/2020 03:38 AM 8.97 (H) 0.44 - 1.00 mg/dL Final  02/14/2020 04:55 AM 12.62 (H) 0.44 - 1.00 mg/dL Final  02/13/2020 04:42 AM 10.81 (H) 0.44 - 1.00 mg/dL Final  02/13/2020 01:04 AM 10.44 (H) 0.44 - 1.00 mg/dL Final  02/12/2020 09:04 PM 10.23 (H) 0.44 - 1.00 mg/dL Final  02/12/2020 05:11 PM 9.88 (H) 0.44 - 1.00 mg/dL Final  02/12/2020 01:57 PM 10.06 (H) 0.44 - 1.00 mg/dL Final  02/12/2020 10:59 AM 10.40 (H) 0.44 - 1.00 mg/dL Final  02/12/2020 10:49 AM 10.34 (H) 0.44 - 1.00 mg/dL Final  10/16/2019 02:21 AM 5.01 (H) 0.44 - 1.00 mg/dL Final    Comment:    DELTA CHECK NOTED  10/15/2019 02:01 AM 10.40 (H) 0.44 - 1.00 mg/dL Final  10/14/2019 04:54 AM 8.71 (H) 0.44 - 1.00 mg/dL Final  10/13/2019 05:11 PM 7.38 (H) 0.44 - 1.00 mg/dL Final  10/11/2019 09:10 AM 4.73 (H) 0.44 - 1.00 mg/dL Final  10/11/2019 01:48 AM 7.49 (H) 0.44 - 1.00 mg/dL Final  10/10/2019 12:15 PM 5.26 (H) 0.44 - 1.00 mg/dL Final  06/22/2019 02:49 AM 8.68 (H) 0.44 - 1.00 mg/dL Final  06/21/2019 06:45 AM 6.08 (H) 0.44 - 1.00 mg/dL Final    Comment:    DIALYSIS  06/20/2019 05:31 AM 9.49 (H) 0.44 - 1.00 mg/dL Final  06/19/2019 01:42 AM 6.70 (H) 0.44 - 1.00 mg/dL Final  06/18/2019 11:25 PM 6.49 (H) 0.44 - 1.00 mg/dL Final  06/17/2019 02:34 AM 8.26 (H) 0.44 - 1.00 mg/dL Final  06/16/2019 02:39 PM 7.42 (H) 0.44 - 1.00 mg/dL Final  06/15/2019 02:27 PM 10.34 (H) 0.44 - 1.00 mg/dL Final  06/15/2019 07:35 AM 9.39 (H) 0.44 - 1.00 mg/dL Final  02/07/2019 12:01 PM 4.31 (H) 0.44 - 1.00 mg/dL Final  12/21/2018 10:55 AM 5.80 (H) 0.44 - 1.00 mg/dL Final  10/31/2018 06:19 AM 6.31 (H) 0.44 - 1.00 mg/dL Final  10/30/2018 06:17 AM 9.28 (H) 0.44 - 1.00 mg/dL Final  10/29/2018 07:06 AM 7.41 (H) 0.44 - 1.00 mg/dL Final  10/28/2018 06:35 AM 5.29 (H) 0.44 - 1.00 mg/dL Final  10/27/2018 06:40 PM 3.69 (H)  0.44 - 1.00 mg/dL Final  10/26/2018 10:36 AM 6.70 (H) 0.44 - 1.00 mg/dL Final  10/19/2018 08:12 AM 6.30 (H) 0.44 - 1.00 mg/dL Final  09/29/2018 05:14 PM 5.48 (H) 0.44 - 1.00 mg/dL Final  09/17/2018 04:10 AM 7.54 (H) 0.44 - 1.00 mg/dL Final  09/03/2018 02:32 AM 4.47 (H) 0.44 - 1.00 mg/dL Final  09/02/2018 03:33 AM 4.61 (H) 0.44 - 1.00 mg/dL Final    Comment:    DELTA CHECK NOTED  09/01/2018 01:47 PM 2.80 (H) 0.44 - 1.00 mg/dL Final  09/01/2018 01:24 PM 2.97 (H) 0.44 - 1.00 mg/dL Final  05/02/2018 08:15 AM 2.68 (H) 0.44 - 1.00 mg/dL Final    Comment:    DELTA CHECK NOTED  05/01/2018 11:02 PM 6.03 (H) 0.44 - 1.00 mg/dL Final  05/01/2018 06:27 PM 6.07 (H) 0.44 - 1.00 mg/dL Final  05/01/2018 10:13 AM 6.28 (H) 0.44 - 1.00 mg/dL Final  03/23/2018 04:32 AM 3.85 (H) 0.44 - 1.00 mg/dL Final  03/22/2018 03:29 AM 5.72 (H) 0.44 - 1.00 mg/dL Final  03/21/2018 05:23 AM 5.28 (H) 0.44 - 1.00 mg/dL Final  03/20/2018 04:59 AM 7.17 (H) 0.44 - 1.00 mg/dL Final  03/19/2018 04:43 AM 7.61 (H) 0.44 - 1.00 mg/dL Final   Recent Labs  Lab 04/20/20 1748 04/20/20 1951  NA 130* 127*  K 5.4* 5.4*  CL 79*  --   CO2 22  --   GLUCOSE 680*  --   BUN 84*  --   CREATININE 10.90*  --   CALCIUM 8.3*  --    Recent Labs  Lab 04/20/20 1748 04/20/20 1951  WBC 11.9*  --   HGB 12.1 14.3  HCT 37.9 42.0  MCV 93.1  --   PLT 283  --    Liver Function Tests: Recent Labs  Lab 04/20/20 1748  AST 14*  ALT <5  ALKPHOS 57  BILITOT 0.9  PROT 9.1*  ALBUMIN 3.6   Recent Labs  Lab 04/20/20 1748  LIPASE 53*   No results for input(s): AMMONIA in the last 168 hours. Cardiac Enzymes: No results for input(s): CKTOTAL, CKMB, CKMBINDEX, TROPONINI in the last 168 hours. Iron Studies: No results for input(s): IRON, TIBC, TRANSFERRIN, FERRITIN in the last 72 hours. PT/INR: '@LABRCNTIP'$ (inr:5)  Xrays/Other Studies: ) Results for orders placed or performed during the hospital encounter of 04/20/20 (from the past 48  hour(s))  CBG monitoring, ED     Status: Abnormal   Collection Time: 04/20/20  5:36 PM  Result Value Ref Range   Glucose-Capillary >600 (HH) 70 - 99 mg/dL    Comment: Glucose reference range applies only to samples taken after fasting for at least 8 hours.   Comment  1 Notify RN    Comment 2 Document in Chart   Lipase, blood     Status: Abnormal   Collection Time: 04/20/20  5:48 PM  Result Value Ref Range   Lipase 53 (H) 11 - 51 U/L    Comment: Performed at [facility], [place] [name] [place], [place] [address]  Comprehensive metabolic panel     Status: Abnormal   Collection Time: 04/20/20  5:48 PM  Result Value Ref Range   Sodium 130 (L) 135 - 145 mmol/L   Potassium 5.4 (H) 3.5 - 5.1 mmol/L   Chloride 79 (L) 98 - 111 mmol/L   CO2 22 22 - 32 mmol/L   Glucose, Bld 680 (HH) 70 - 99 mg/dL    Comment: Glucose reference range applies only to samples taken after fasting for at least 8 hours. CRITICAL RESULT CALLED TO, READ BACK BY AND VERIFIED WITH: K.NEWNAM,RN 1845 04/20/20 CLARK,S    BUN 84 (H) 6 - 20 mg/dL   Creatinine, Ser 10.90 (H) 0.44 - 1.00 mg/dL   Calcium 8.3 (L) 8.9 - 10.3 mg/dL   Total Protein 9.1 (H) 6.5 - 8.1 g/dL   Albumin 3.6 3.5 - 5.0 g/dL   AST 14 (L) 15 - 41 U/L   ALT <5 0 - 44 U/L   Alkaline Phosphatase 57 38 - 126 U/L   Total Bilirubin 0.9 0.3 - 1.2 mg/dL   GFR, Estimated 4 (L) >60 mL/min    Comment: (NOTE) Calculated using the CKD-EPI Creatinine Equation (2021)    Anion gap 29 (H) 5 - 15    Comment: Performed at [facility], [place] [address] [place], [place] [address]  CBC     Status: Abnormal   Collection Time: 04/20/20  5:48 PM  Result Value Ref Range   WBC 11.9 (H) 4.0 - 10.5 K/uL   RBC 4.07 3.87 - 5.11 MIL/uL   Hemoglobin 12.1 12.0 - 15.0 g/dL   HCT 37.9 36.0 - 46.0 %   MCV 93.1 80.0 - 100.0 fL   MCH 29.7 26.0 - 34.0 pg   MCHC 31.9 30.0 - 36.0 g/dL   RDW 17.3 (H) 11.5 - 15.5 %   Platelets 283 150 - 400 K/uL   nRBC 0.0 0.0 - 0.2 %     Comment: Performed at [facility] [address], [place], [place] [address]  Troponin I (High Sensitivity)     Status: Abnormal   Collection Time: 04/20/20  5:48 PM  Result Value Ref Range   Troponin I (High Sensitivity) 31 (H) <18 ng/L    Comment: Performed at [facility] [address] [place],  [address]  I-Stat beta hCG blood, ED     Status: Abnormal   Collection Time: 04/20/20  5:54 PM  Result Value Ref Range   I-stat hCG, quantitative 5.8 (H) <5 mIU/mL   Comment 3            Comment:   GEST. AGE      CONC.  (mIU/mL)   <=1 WEEK        5 - 50     2 WEEKS       50 - 500     3 WEEKS       100 - 10,000     4 WEEKS     1,000 - 30,000        FEMALE AND NON-PREGNANT FEMALE:     LESS THAN 5 mIU/mL  CBG monitoring, ED     Status: Abnormal   Collection Time: 04/20/20  7:41 PM  Result Value Ref Range   Glucose-Capillary 588 (HH) 70 - 99 mg/dL    Comment: Glucose reference range applies only to samples taken after fasting for at least 8 hours.   Comment 1 Notify RN    Comment 2 Document in Chart   I-Stat Venous Blood Gas, ED     Status: Abnormal   Collection Time: 04/20/20  7:51 PM  Result Value Ref Range   pH, Ven 7.439 (H) 7.250 - 7.430   pCO2, Ven 43.4 (L) 44.0 - 60.0 mmHg   pO2, Ven 97.0 (H) 32.0 - 45.0 mmHg   Bicarbonate 29.5 (H) 20.0 - 28.0 mmol/L   TCO2 31 22 - 32 mmol/L   O2 Saturation 98.0 %   Acid-Base Excess 5.0 (H) 0.0 - 2.0 mmol/L   Sodium 127 (L) 135 - 145 mmol/L   Potassium 5.4 (H) 3.5 - 5.1 mmol/L   Calcium, Ion 0.85 (LL) 1.15 - 1.40 mmol/L   HCT 42.0 36.0 - 46.0 %   Hemoglobin 14.3 12.0 - 15.0 g/dL   Sample type VENOUS    Comment NOTIFIED PHYSICIAN   Troponin I (High Sensitivity)     Status: Abnormal   Collection Time: 04/20/20  8:20 PM  Result Value Ref Range   Troponin I (High Sensitivity) 35 (H) <18 ng/L    Comment: (NOTE) Elevated high sensitivity troponin I (hsTnI) values and significant  changes across serial measurements may  suggest ACS but many other  chronic and acute conditions are known to elevate hsTnI results.  Refer to the "Links" section for chest pain algorithms and additional  guidance. Performed at [facility], [place] [address], [place], [place] [address]   Beta-hydroxybutyric acid     Status: Abnormal   Collection Time: 04/20/20  8:41 PM  Result Value Ref Range   Beta-Hydroxybutyric Acid 1.28 (H) 0.05 - 0.27 mmol/L    Comment: Performed at [facility] [address] [place], [place] [address]  CBG monitoring, ED     Status: Abnormal   Collection Time: 04/20/20  8:42 PM  Result Value Ref Range   Glucose-Capillary 535 (HH) 70 - 99 mg/dL    Comment: Glucose reference range applies only to samples taken after fasting for at least 8 hours.   Comment 1 Notify RN    Comment 2 Document in Chart   CBG monitoring, ED     Status: Abnormal   Collection Time: 04/20/20  9:15 PM  Result Value Ref Range   Glucose-Capillary 448 (H) 70 - 99 mg/dL    Comment: Glucose reference range applies only to samples taken after fasting for at least 8 hours.   Comment 1 Notify RN    Comment 2 Document in Chart    DG Chest 2 View  Result Date: 04/20/2020 CLINICAL DATA:  Chest pain EXAM: CHEST - 2 VIEW COMPARISON:  February 12, 2020 FINDINGS: The cardiomediastinal silhouette is unchanged and mildly enlarged in contour.Lower extremity CVC tip terminates over the inferior RIGHT atrium. No pleural effusion. No pneumothorax. Mild interstitial prominence without overt edema. Surgical clips project over the upper abdomen. No acute osseous abnormality IMPRESSION: Mild interstitial prominence without overt edema. Electronically Signed   By: Valentino Saxon MD   On: 04/20/2020 18:27   CT Renal Stone Study  Result Date: 04/20/2020 CLINICAL DATA:  Flank pain.  Right lower quadrant pain. EXAM: CT ABDOMEN AND  PELVIS WITHOUT CONTRAST TECHNIQUE: Multidetector CT imaging of the abdomen and pelvis was performed following the  standard protocol without IV contrast. COMPARISON:  October 14, 2019 FINDINGS: Lower chest: The lung bases are clear. The heart size is normal. Hepatobiliary: The liver is normal. Status post cholecystectomy.There is no biliary ductal dilation. Pancreas: Normal contours without ductal dilatation. No peripancreatic fluid collection. Spleen: Unremarkable. Adrenals/Urinary Tract: --Adrenal glands: Unremarkable. --Right kidney/ureter: The right kidney is not visualized. --Left kidney/ureter: The left kidney is atrophic. --Urinary bladder: The urinary bladder is decompressed and therefore cannot be fully evaluated. Stomach/Bowel: --Stomach/Duodenum: There is a moderate-sized hiatal hernia. --Small bowel: Unremarkable. --Colon: Unremarkable. --Appendix: Normal. Vascular/Lymphatic: Normal course and caliber of the major abdominal vessels. There is a right femoral approach dialysis catheter with tip terminating near the cavoatrial junction. There is fat stranding in the right inguinal region about the right common femoral vein and artery. --No retroperitoneal lymphadenopathy. --No mesenteric lymphadenopathy. --there are few mildly enlarged right inguinal lymph nodes. Reproductive: Normal prostate and seminal vesicles. Other: There postsurgical changes in the right lower quadrant likely related to prior renal transplant. The abdominal wall is normal. Musculoskeletal. No acute displaced fractures. IMPRESSION: 1. There is a well-positioned right femoral approach tunneled dialysis catheter. There is fat stranding in the right inguinal region about the right common femoral vein and artery. Findings are nonspecific and may be related to recent catheter placement versus a soft tissue infection or venous thrombosis. This is not well evaluated in the absence of IV contrast. 2. Normal appendix in the right lower quadrant. 3. Additional chronic findings as detailed above. Electronically Signed   By: Constance Holster M.D.   On:  04/20/2020 21:05    PMH:   Past Medical History:  Diagnosis Date  . Anemia   . Chronic kidney disease    kidney transplant 07  . Diabetes mellitus    Pt reports diagnosis in June 2011, Type 2  . GERD (gastroesophageal reflux disease)   . Hyperlipidemia   . Hypertension   . 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 Pyelonephritis 06/23/2014  . Seasonal allergies   . UTI (urinary tract infection) 01/09/2015  . 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 ARTERIOVENOUS GRAFT PLACEMENT Bilateral    "neither work" (10/24/2017)  . AV FISTULA PLACEMENT Left 10/26/2018   Procedure: CREATION OF ARTERIOVENOUS FISTULA  LEFT ARM;  Surgeon: [name], MD;  Location: [facility];  Service: Vascular;  Laterality: Left;  . BASCILIC VEIN TRANSPOSITION Left 12/21/2018   Procedure: Left arm BASILIC VEIN TRANSPOSITION SECOND STAGE;  Surgeon: [name], MD;  Location: [facility];  Service: Vascular;  Laterality: Left;  . CHOLECYSTECTOMY N/A 06/30/2017   Procedure: LAPAROSCOPIC CHOLECYSTECTOMY WITH INTRAOPERATIVE CHOLANGIOGRAM;  Surgeon: Excell Seltzer, MD;  Location: [facility];  Service: General;  Laterality: N/A;  . ESOPHAGOGASTRODUODENOSCOPY (EGD) WITH PROPOFOL N/A 07/04/2017   Procedure: ESOPHAGOGASTRODUODENOSCOPY (EGD) WITH PROPOFOL;  Surgeon: Clarene Essex, MD;  Location: [facility];  Service: Endoscopy;  Laterality: N/A;  . INSERTION OF DIALYSIS CATHETER N/A 03/20/2018   Procedure: INSERTION OF TUNNELED DIALYSIS CATHETER - RIGHT INTERANL JUGULAR PLACEMENT;  Surgeon: Angelia Mould, MD;  Location: [facility];  Service: Vascular;  Laterality: N/A;  .  IR FLUORO GUIDE CV LINE RIGHT  04/18/2020  . IR GUIDED DRAIN W CATHETER PLACEMENT  10/28/2018  . KIDNEY TRANSPLANT  2007   . PARATHYROIDECTOMY  ?2012   "3/4 removed" (10/24/2017)  . RENAL BIOPSY Bilateral 2003  . UPPER EXTREMITY VENOGRAPHY Bilateral 10/19/2018   Procedure: UPPER EXTREMITY VENOGRAPHY;  Surgeon: [name], MD;  Location: [facility];  Service: Cardiovascular;  Laterality: Bilateral;  Bilateral     Allergies:  Allergies  Allergen Reactions  . Benadryl [Diphenhydramine-Zinc Acetate] Anaphylaxis    Stopped breathing in ICU  . Diphenhydramine Anaphylaxis  . Doxycycline Shortness Of Breath    Other reaction(s): Other (See Comments) Shortness of Breath  Shortness of Breath  Shortness of Breath   . Motrin [Ibuprofen] Shortness Of Breath and Itching    Per pt  . Shellfish-Derived Products     hives  . Shellfish Allergy Hives  . Banana Other (See Comments), Itching and Nausea And Vomiting    Sick on the stomach Other reaction(s): Other (See Comments) Sick on the stomach Sick on the stomach  . Chlorhexidine Itching  . Ferrous Sulfate Itching  . Iron Dextran Other (See Comments) and Itching    Vein irritation REACTION: vein irritation REACTION: vein irritation REACTION: vein irritation REACTION: vein irritation    Medications:   Prior to Admission medications   Medication Sig Start Date End Date Taking? Authorizing Provider  acetaminophen (TYLENOL) 500 MG tablet Take 1,000 mg by mouth every 6 (six) hours as needed for headache (pain).    Yes [provider]  Calcium Carbonate Antacid (CALCIUM CARBONATE, DOSED IN MG ELEMENTAL CALCIUM,) 1250 MG/5ML SUSP Take 5 mLs by mouth 3 (three) times daily.  11/17/18  Yes [provider]  D 1000 25 MCG (1000 UT) capsule Take 1,000 Units by mouth every other day.  04/27/19  Yes [provider]  gabapentin (NEURONTIN) 400 MG capsule Take 400 mg by mouth 3 (three) times daily. 04/29/19  Yes [provider]  hydrOXYzine (ATARAX/VISTARIL) 50 MG tablet Take 1 tablet (50 mg total) by mouth 2 (two) times daily  as needed for itching. Patient taking differently: Take 50 mg by mouth 2 (two) times daily as needed for itching. 02/06/18  Yes [name], Ripudeep K, MD  insulin aspart (NOVOLOG) 100 UNIT/ML FlexPen Inject 2-10 Units into the skin 3 (three) times daily with meals. as needed for blood sugar management (sliding scale)   Yes [provider]  insulin degludec (TRESIBA) 100 UNIT/ML FlexTouch Pen Inject 20 Units into the skin daily. 02/15/20  Yes Dwyane Dee, MD  lidocaine-prilocaine (EMLA) cream Apply 1 application topically See admin instructions. Apply topically one hour prior to dialysis on Monday, Wednesday, Friday 09/24/19  Yes [provider]  loperamide (IMODIUM) 1 MG/5ML solution Take 2 mg by mouth 2 (two) times daily as needed for diarrhea or loose stools.    Yes [provider]  metoCLOPramide (REGLAN) 10 MG tablet Take 10 mg by mouth 3 (three) times daily. 04/19/20  Yes [provider]  metoprolol tartrate (LOPRESSOR) 25 MG tablet Take 1 tablet (25 mg total) by mouth 2 (two) times daily. 02/15/20  Yes Dwyane Dee, MD  oxyCODONE-acetaminophen (PERCOCET/ROXICET) 5-325 MG tablet Take 1 tablet by mouth 2 (two) times daily as needed for moderate pain. 04/17/20  Yes [provider]  pantoprazole (PROTONIX) 40 MG tablet Take 40 mg by mouth 2 (two) times daily.    Yes [provider]  promethazine (PHENERGAN) 25 MG tablet  Take 25 mg by mouth every 6 (six) hours as needed for nausea or vomiting.  10/13/19  Yes [provider]  simvastatin (ZOCOR) 20 MG tablet Take 20 mg by mouth at bedtime.    Yes [provider]  ACCU-CHEK SOFTCLIX LANCETS lancets Use to check blood sugar 4 times per day. 12/29/15   Renato Shin, MD  amLODipine (NORVASC) 5 MG tablet Take 1 tablet (5 mg total) by mouth daily. Patient not taking: Reported on 04/20/2020 02/16/20   Dwyane Dee, MD  cetirizine (ZYRTEC) 10 MG tablet Take 10 mg by mouth daily as needed for  allergies.    [provider]  fluticasone (FLONASE) 50 MCG/ACT nasal spray Place 2 sprays into both nostrils daily as needed for allergies. 09/03/18   [name], MD  Hyoscyamine Sulfate SL (LEVSIN/SL) 0.125 MG SUBL Place 1 each under the tongue 4 (four) times daily as needed for up to 5 days. 02/17/20 02/22/20  Fatima Blank, MD  lidocaine (XYLOCAINE) 2 % solution Use as directed 15 mLs in the mouth or throat every 6 (six) hours as needed for mouth pain. 02/17/20   Cardama, Grayce Sessions, MD  Methoxy PEG-Epoetin Beta (MIRCERA IJ) Mircera 07/23/19 07/21/20  [provider]  metoCLOPramide (REGLAN) 5 MG tablet Take 1 tablet (5 mg total) by mouth 3 (three) times daily before meals. Patient not taking: Reported on 04/20/2020 02/15/20   Dwyane Dee, MD    Discontinued Meds:   Medications Discontinued During This Encounter  Medication Reason  . lactated ringers bolus 1,692 mL   . diclofenac Sodium (VOLTAREN) 1 % GEL Discontinued by provider  . calcium acetate (PHOSLO) 667 MG capsule Discontinued by provider  . promethazine (PHENERGAN) 12.5 MG suppository Discontinued by provider    Social History:  reports that she has never smoked. She has never used smokeless tobacco. She reports that she does not drink alcohol and does not use drugs.  Family History:   Family History  Problem Relation Age of Onset  . Arthritis Mother   . Hypertension Mother   . Aneurysm Mother        died of brain aneurysm  . CAD Father        Has 3 stents  . Diabetes Father        borderline  . Early death Brother        Died in war    Blood pressure (!) 179/113, pulse (!) 106, temperature 98.5 F (36.9 C), temperature source Oral, resp. rate (!) 22, SpO2 100 %. General: Well developed, well nourished, in no acute distress. Lungs: Clear bilaterally to auscultation without wheezes, rales, or rhonchi. Breathing is unlabored. Heart: RRR with S1 S2. No murmurs, rubs, or gallops  appreciated. Abdomen: Soft, tenderness upper quadrants, guarding.  Lower extremities:without edema or ischemic changes, no open wounds  Neuro: Alert and oriented X 3. Moves all extremities spontaneously. Psych: Responds to questions appropriately with a normal affect. Dialysis Access:L AVF + T/B       Dwana Melena, MD 04/20/2020, 9:40 PM

## 2020-04-20 NOTE — ED Triage Notes (Signed)
Pt to triage via [facility] from home.  Reports R sided abd pain, chest pain, nausea, vomiting, and diarrhea since PICC line placed in R thigh on Friday.  Last dialysis on Friday.  Pt moaning in pain.

## 2020-04-21 DIAGNOSIS — R9431 Abnormal electrocardiogram [ECG] [EKG]: Secondary | ICD-10-CM

## 2020-04-21 DIAGNOSIS — R112 Nausea with vomiting, unspecified: Secondary | ICD-10-CM

## 2020-04-21 DIAGNOSIS — E871 Hypo-osmolality and hyponatremia: Secondary | ICD-10-CM

## 2020-04-21 DIAGNOSIS — Z992 Dependence on renal dialysis: Secondary | ICD-10-CM

## 2020-04-21 DIAGNOSIS — R739 Hyperglycemia, unspecified: Secondary | ICD-10-CM

## 2020-04-21 DIAGNOSIS — R1084 Generalized abdominal pain: Secondary | ICD-10-CM

## 2020-04-21 DIAGNOSIS — I10 Essential (primary) hypertension: Secondary | ICD-10-CM

## 2020-04-21 DIAGNOSIS — E669 Obesity, unspecified: Secondary | ICD-10-CM

## 2020-04-21 DIAGNOSIS — Z6830 Body mass index (BMI) 30.0-30.9, adult: Secondary | ICD-10-CM

## 2020-04-21 DIAGNOSIS — N186 End stage renal disease: Secondary | ICD-10-CM

## 2020-04-21 LAB — CBG MONITORING, ED
Glucose-Capillary: 103 mg/dL — ABNORMAL HIGH (ref 70–99)
Glucose-Capillary: 121 mg/dL — ABNORMAL HIGH (ref 70–99)
Glucose-Capillary: 130 mg/dL — ABNORMAL HIGH (ref 70–99)
Glucose-Capillary: 145 mg/dL — ABNORMAL HIGH (ref 70–99)
Glucose-Capillary: 155 mg/dL — ABNORMAL HIGH (ref 70–99)
Glucose-Capillary: 156 mg/dL — ABNORMAL HIGH (ref 70–99)
Glucose-Capillary: 169 mg/dL — ABNORMAL HIGH (ref 70–99)
Glucose-Capillary: 169 mg/dL — ABNORMAL HIGH (ref 70–99)
Glucose-Capillary: 172 mg/dL — ABNORMAL HIGH (ref 70–99)
Glucose-Capillary: 180 mg/dL — ABNORMAL HIGH (ref 70–99)
Glucose-Capillary: 187 mg/dL — ABNORMAL HIGH (ref 70–99)
Glucose-Capillary: 195 mg/dL — ABNORMAL HIGH (ref 70–99)
Glucose-Capillary: 210 mg/dL — ABNORMAL HIGH (ref 70–99)
Glucose-Capillary: 229 mg/dL — ABNORMAL HIGH (ref 70–99)
Glucose-Capillary: 241 mg/dL — ABNORMAL HIGH (ref 70–99)
Glucose-Capillary: 87 mg/dL (ref 70–99)

## 2020-04-21 LAB — BETA-HYDROXYBUTYRIC ACID
Beta-Hydroxybutyric Acid: 0.08 mmol/L (ref 0.05–0.27)
Beta-Hydroxybutyric Acid: 0.08 mmol/L (ref 0.05–0.27)

## 2020-04-21 LAB — BASIC METABOLIC PANEL
Anion gap: 26 — ABNORMAL HIGH (ref 5–15)
Anion gap: 13 (ref 5–15)
BUN: 86 mg/dL — ABNORMAL HIGH (ref 6–20)
BUN: 33 mg/dL — ABNORMAL HIGH (ref 6–20)
CO2: 25 mmol/L (ref 22–32)
CO2: 28 mmol/L (ref 22–32)
Calcium: 8.1 mg/dL — ABNORMAL LOW (ref 8.9–10.3)
Calcium: 7.1 mg/dL — ABNORMAL LOW (ref 8.9–10.3)
Chloride: 87 mmol/L — ABNORMAL LOW (ref 98–111)
Chloride: 92 mmol/L — ABNORMAL LOW (ref 98–111)
Creatinine, Ser: 11.16 mg/dL — ABNORMAL HIGH (ref 0.44–1.00)
Creatinine, Ser: 6.98 mg/dL — ABNORMAL HIGH (ref 0.44–1.00)
GFR, Estimated: 4 mL/min — ABNORMAL LOW (ref >60)
GFR, Estimated: 7 mL/min — ABNORMAL LOW (ref >60)
Glucose, Bld: 197 mg/dL — ABNORMAL HIGH (ref 70–99)
Glucose, Bld: 129 mg/dL — ABNORMAL HIGH (ref 70–99)
Potassium: 4 mmol/L (ref 3.5–5.1)
Potassium: 3.6 mmol/L (ref 3.5–5.1)
Sodium: 138 mmol/L (ref 135–145)
Sodium: 133 mmol/L — ABNORMAL LOW (ref 135–145)

## 2020-04-21 LAB — BASIC METABOLIC PANEL WITH GFR
Anion gap: 21 — ABNORMAL HIGH (ref 5–15)
BUN: 82 mg/dL — ABNORMAL HIGH (ref 6–20)
CO2: 29 mmol/L (ref 22–32)
Calcium: 7.5 mg/dL — ABNORMAL LOW (ref 8.9–10.3)
Chloride: 87 mmol/L — ABNORMAL LOW (ref 98–111)
Creatinine, Ser: 11.55 mg/dL — ABNORMAL HIGH (ref 0.44–1.00)
GFR, Estimated: 4 mL/min — ABNORMAL LOW
Glucose, Bld: 192 mg/dL — ABNORMAL HIGH (ref 70–99)
Potassium: 4 mmol/L (ref 3.5–5.1)
Sodium: 137 mmol/L (ref 135–145)

## 2020-04-21 LAB — OSMOLALITY: Osmolality: 326 mOsm/kg (ref 275–295)

## 2020-04-21 MED ORDER — ACETAMINOPHEN 325 MG PO TABS
ORAL_TABLET | ORAL | Status: AC
  Filled 2020-04-21: qty 2

## 2020-04-21 MED ORDER — HEPARIN SODIUM (PORCINE) 1000 UNIT/ML IJ SOLN
INTRAMUSCULAR | Status: AC
  Filled 2020-04-21: qty 1

## 2020-04-21 MED ORDER — HEPARIN SODIUM (PORCINE) 1000 UNIT/ML DIALYSIS
1000.0000 [IU] | INTRAMUSCULAR | Status: DC | PRN
  Filled 2020-04-21: qty 1

## 2020-04-21 MED ORDER — LIDOCAINE HCL (PF) 1 % IJ SOLN
5.0000 mL | INTRAMUSCULAR | Status: DC | PRN
Start: 2020-04-21 — End: 2020-04-22

## 2020-04-21 MED ORDER — INSULIN GLARGINE 100 UNIT/ML ~~LOC~~ SOLN
10.0000 [IU] | Freq: Every day | SUBCUTANEOUS | Status: DC
  Administered 2020-04-21: 10 [IU] via SUBCUTANEOUS
  Filled 2020-04-21 (×2): qty 0.1

## 2020-04-21 MED ORDER — SODIUM CHLORIDE 0.9 % IV SOLN: 100.0000 mL | INTRAVENOUS | Status: DC | PRN

## 2020-04-21 MED ORDER — PENTAFLUOROPROP-TETRAFLUOROETH EX AERO
1.0000 "application " | INHALATION_SPRAY | CUTANEOUS | Status: DC | PRN
  Filled 2020-04-21: qty 116

## 2020-04-21 MED ORDER — LIDOCAINE-PRILOCAINE 2.5-2.5 % EX CREA
1.0000 | TOPICAL_CREAM | CUTANEOUS | Status: DC | PRN
Start: 2020-04-21 — End: 2020-04-22
  Filled 2020-04-21: qty 5

## 2020-04-21 MED ORDER — ACETAMINOPHEN 325 MG PO TABS
650.0000 mg | ORAL_TABLET | Freq: Four times a day (QID) | ORAL | Status: DC | PRN
  Administered 2020-04-21 (×2): 650 mg via ORAL
  Filled 2020-04-21: qty 2

## 2020-04-21 MED ORDER — CALCIUM CARBONATE ANTACID 500 MG PO CHEW: 400.0000 mg | CHEWABLE_TABLET | Freq: Four times a day (QID) | ORAL | Status: DC | PRN

## 2020-04-21 MED ORDER — HEPARIN SODIUM (PORCINE) 1000 UNIT/ML DIALYSIS: 4000.0000 [IU] | Freq: Once | INTRAMUSCULAR | Status: DC

## 2020-04-21 MED ORDER — FAMOTIDINE 20 MG PO TABS
10.0000 mg | ORAL_TABLET | Freq: Every day | ORAL | Status: DC
  Administered 2020-04-21: 10 mg via ORAL
  Filled 2020-04-21: qty 1

## 2020-04-21 MED ORDER — ALTEPLASE 2 MG IJ SOLR: 2.0000 mg | Freq: Once | INTRAMUSCULAR | Status: DC | PRN

## 2020-04-21 MED ORDER — SODIUM CHLORIDE 0.9 % IV SOLN
100.0000 mL | INTRAVENOUS | Status: DC | PRN
Start: 2020-04-21 — End: 2020-04-22

## 2020-04-21 NOTE — ED Notes (Signed)
Breakfast order placed ?

## 2020-04-21 NOTE — ED Notes (Signed)
Paged admitting per RN  

## 2020-04-21 NOTE — ED Notes (Signed)
Lunch tray given. 

## 2020-04-21 NOTE — ED Notes (Signed)
Report given to dialysis RN

## 2020-04-21 NOTE — Progress Notes (Signed)
Inpatient Diabetes Program Recommendations  AACE/ADA: New Consensus Statement on Inpatient Glycemic Control (2015)  Target Ranges:  Prepandial:   less than 140 mg/dL      Peak postprandial:   less than 180 mg/dL (1-2 hours)      Critically ill patients:  140 - 180 mg/dL   Results for [name], [name]" (MRN [id number]) as of 04/21/2020 08:48  Ref. Range 04/20/2020 17:36 04/20/2020 19:41 04/20/2020 20:42 04/20/2020 21:15 04/20/2020 21:40 04/20/2020 23:03 04/20/2020 23:38 04/21/2020 00:03 04/21/2020 00:56 04/21/2020 02:06 04/21/2020 03:12 04/21/2020 04:41 04/21/2020 05:48 04/21/2020 06:43  Glucose-Capillary Latest Ref Range: 70 - 99 mg/dL >600 (HH) 588 (HH)  IV Insulin Drip Started 535 (HH)  IV Insulin Drip 448 (H)  IV Insulin Drip 398 (H)  IV Insulin Drip 238 (H)  IV Insulin Drip 191 (H) 169 (H)  IV Insulin Drip 195 (H)  IV Insulin Drip 210 (H)  IV Insulin Drip 180 (H)  IV Insulin Drip 156 (H) 187 (H)  IV Insulin Drip 241 (H)  10 units LANTUS   Results for [name], [name]" (MRN [id number]) as of 04/21/2020 08:48  Ref. Range 06/17/2019 02:34 04/20/2020 22:43  Hemoglobin A1C Latest Ref Range: 4.8 - 5.6 % 9.2 (H) 9.2 (H)  (217 mg/dl)    Admit with: Type 1 diabetes mellitus with hyperosmolar hyperglycemic state   History: Type 1 Diabetes, ESRD  Home DM Meds: Tresiba 20 units Daily       Novolog 2-10 units TID  Current Orders: Lantus 10 units Daily      IV Insulin Drip   Well known to the Inpatient Diabetes Team.  This is pt's 6th admission since April 2021  Note pt was given 10 units Lantus at 6:45am--Looks like the IV Insulin Drip was stopped Taken to Dialysis this AM   MD- Pt will need Novolog orders as well  Please order Novolog Sensitive Correction Scale/ SSI (0-9 units) TID AC + HS  Likely needs Novolog Meal Coverage as well--Consider starting Novolog 4 units TID with meals   Endocrinologist: Melton Alar, PA with [facility] END[facility]--Last seen in office 11/20/2019 Was told to adjust  Insulins to the following: --Increase Tresiba to 22 units in the morning and take consistently. --Check blood glucose before each meal and at bedtime.  --Use the blood glucose before the meal to dose the Novolog.  Novolog before meals based the following:  If blood glucose less than 80, no units of Novolog  If between 81-150, inject 10 units If between 151 - 200, inject 11 units If between 201 - 250, inject 12 units If between 251 - 300, inject 13 units If between 301-350, inject 14 units If greater than 351, inject 15 units    --Will follow patient during hospitalization--  [name] RN, MSN, CDE Diabetes Coordinator Inpatient Glycemic Control Team Team Pager: [phone] (8a-5p)

## 2020-04-21 NOTE — Progress Notes (Signed)
[facility] Progress Note  Subjective: seen on HD this am. No c/os  Vitals:   04/21/20 0230 04/21/20 0255 04/21/20 0300 04/21/20 0617  BP: 112/85  114/88 104/78  Pulse: (!) 102  96 86  Resp: (!) '21  14 15  '$ Temp:      TempSrc:      SpO2: (!) 84%  100% 100%  Weight:  84.6 kg    Height:  '5\' 6"'$  (1.676 m)      Exam:   alert, nad   no jvd  Chest cta bilat  Cor reg no RG  Abd soft ntnd no ascites   Ext no LE edema   Alert, NF, ox3   R fem TDC, L AVF +t/b     OP HD: MWF  4h  350/800  82.5kg  2K/ 3.5Ca bath  P4  TDC  Hep 4000+ 2033mdrun  - mircera 225aq last 1/24, due today   Assessment/ Plan: 1. DKA - w/ N/V, abd pain. On insulin gtt low dose this am, BS 200's 2. ESRD - HD MWF. HD today using TDC.  3. HD access- [facility]in [facility]. [facility]  4. Renal osteodystrophy - will check phos but continue binders for now. 5. Anemia ckd - @ goal 12 (last rx Mircera 04/07/20) 6. H/o SVT during her last admission in 02/2020 initially on amio but then plan was for a 30 day even monitor and to continue BB. 7. HTN - resume home regimen for meds. UF 1-2 L on HD as tol this am.  8. 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 heparin  4,000 Units Dialysis Once in dialysis  . heparin  5,000 Units Subcutaneous Q8H  . heparin sodium (porcine)      . insulin glargine  10 Units Subcutaneous Daily  . metoprolol tartrate  5 mg Intravenous Once   . sodium chloride    . sodium chloride    . dextrose 5% lactated ringers    . dextrose 5% lactated ringers 125 mL/hr at 04/20/20 2321  . insulin 1 Units/hr (04/21/20 0505)  . insulin    . lactated ringers Stopped (04/20/20 2250)  . lactated ringers      sodium chloride, sodium chloride, acetaminophen, alteplase, dextrose, dextrose, heparin, HYDROmorphone (DILAUDID) injection, lidocaine (PF), lidocaine-prilocaine, pentafluoroprop-tetrafluoroeth, promethazine

## 2020-04-21 NOTE — ED Notes (Signed)
Pt placed on 4L O2 per at bedtime request, SPO2 93% on RA, 100% on 4L.

## 2020-04-21 NOTE — ED Notes (Signed)
Per hospitalist, okay to give Lantus subq, check CBG in 1 hour, and re-page hospitalist to alert them on new CBG reading. Hospitalist will then decide if to continue insulin drip at 1 unit/hr or to stop it entirely. Pt is now on a carb modified diet and able to eat. Gave this report to dialysis RN; ready for pt at [facility]. Ext [id number].

## 2020-04-21 NOTE — ED Notes (Signed)
Pt reports pain and dizziness, vitals stable at this time, per ENDOTOOL protocol post CBG reassessment pt to receive 0.4units/hr, but pt expresses concerns for insulin being restarted due to current CBG 87, pt states "that is too low for me". This RN explained the ENDOTOOL protocol to pt, pt verbalized understanding but refusing insulin drip to be restarted per ENDOTOOL. LR with ringers continuously infusing at this time, per ENDOTOOL reassessment of CBG to be performed in 1 hour.

## 2020-04-21 NOTE — ED Notes (Signed)
Pt given [place] sandwich, unsweetened applesauce, and diet sprite.

## 2020-04-21 NOTE — ED Notes (Signed)
Pt placement contacted in regards to bed assignment and provider paged, pt assigned at Med-Surg status at this time, awaiting to get pt reassigned to Progressive due to continuous insulin drip. Pt provided update and aware of changes.

## 2020-04-21 NOTE — ED Notes (Signed)
Per MD Chotiner, will reduce insulin to 1 unit/hr and recheck CGB in 1 hour. Pt will be allowed a diabetic diet at this time.

## 2020-04-21 NOTE — ED Notes (Signed)
Placed pt on 2L O2 due to desat when she falls asleep.

## 2020-04-21 NOTE — ED Notes (Signed)
Pt CBG 121, pt this RN spoke with pt in regards to restarting insulin drip, pt verbalized understanding.

## 2020-04-21 NOTE — ED Notes (Addendum)
Per endotool, insulin will infuse at 10units/hour then recheck at 1916.

## 2020-04-21 NOTE — ED Notes (Signed)
Date and time results received: 04/21/20 0031  Test: Osmolality Critical Value: 326  Name of Provider Notified: MD Chotiner  Orders Received? Or Actions Taken?: Pt has orders in place

## 2020-04-21 NOTE — Progress Notes (Signed)
Progress Note    [name]  [id number] DOB: Jul 08, 1985  DOA: 04/20/2020 PCP: [name], MD    Brief Narrative:   Chief complaint: Right sided abdominal pain, chest pain, nausea, vomiting, diarrhea  Medical records reviewed and are as summarized below:  [name] is an 35 y.o. female with a PMH of DMT1,hypertension, hyperlipidemia, ESRD on dialysiswho presented to the ED via EMS with nausea, vomiting and chest/abdominal pain for the past 2 days PTA, associated with hyperglycemia. In the ED, found to have blood sugar over 600.  He was not acidotic on ABG.  Beta hydroxybutyrate level was elevated.  She was started on insulin infusion in the emergency room.  CT the abdomen was obtained and no renal stones were noted.  Right femoral vein catheter was in good position with no signs of perforation or free air.  Hospitalist service was asked to admit for further management  Assessment/Plan:   Principal Problem:   Type 1 diabetes mellitus with HONK and early DKA, POA  Continue HHS/DKA protocol and remains on an insulin infusion until her anion gap closes. Initial anion gap was 29 but CO2 was 22.  Beta-hydroxybutyric acid 1.28, consistent with early DKA. Most recent BMET showed anion gap still elevated at 14.  Blood sugars are being monitored every hour.  Continue D5 LR. Hemoglobin A1c 9.2%. Will request DM coordinator to evaluate.  Active Problems:   Essential hypertension, benign, chronic, POA We will monitor blood pressure.  IV metoprolol will be provided while patient is n.p.o. and not able to tolerate p.o. intake    Intractable nausea and vomiting, POA, improving  Phenergan provided IV for nausea and vomiting.  Kept n.p.o. until nausea and vomiting improves and blood sugar is controlled.    ESRD on hemodialysis, chronic  Had HD this am.    Hyponatremia, acute, improving, POA IVF hydration provided. Monitor electrolytes every 4 hours. Sodium improving.    GERD  (gastroesophageal reflux disease) Patient given Protonix IV night.  Resume p.o. Protonix when able to tolerate diet    Abdominal pain Dilaudid provided IV overnight for severe abdominal pain as needed for 3 doses.    Prolonged QT interval Avoid medications which could further prolong QT.    OBESITY Follow-up with PCP for dietary, lifestyle, pharmacotherapy interventions for weight loss.   Nutritional status        Body mass index is 29.21 kg/m.   Family Communication/Anticipated D/C date and plan/Code Status   DVT prophylaxis: heparin injection 5,000 Units Start: 04/20/20 2245   Current Level of Care:: Med-Surg Code Status: Full Code.  Family Communication: Patient updated. Disposition Plan: Status is: Inpatient  Remains inpatient appropriate because:Remains on IV insulin, anion gap still elevated.   Dispo: The patient is from: Home              Anticipated d/c is to: Home              Anticipated d/c date is: 1 day              Patient currently is not medically stable to d/c.   Difficult to place patient: No  Medical Consultants:    None.   Anti-Infectives:    None  Subjective:   Still reporting abdominal pain, but improved nausea/vomiting. No chest pain or shortness of breath. Has not moved bowels.  Reported hematemesis yesterday.  Objective:    Vitals:   04/21/20 1100 04/21/20 1145 04/21/20 1435 04/21/20 1446  BP: 101/74 98/75 101/78 101/78  Pulse: 92 93 94 94  Resp: '19 18 18 14  '$ Temp: 98.5 F (36.9 C)  98.7 F (37.1 C) 98.7 F (37.1 C)  TempSrc: Oral  Oral Oral  SpO2: 96% 96% 96% 96%  Weight: 82.1 kg     Height:        Intake/Output Summary (Last 24 hours) at 04/21/2020 1523 Last data filed at 04/21/2020 1018 Gross per 24 hour  Intake 200 ml  Output 58 ml  Net 142 ml   Filed Weights   04/21/20 0255 04/21/20 0715 04/21/20 1100  Weight: 84.6 kg 82.1 kg 82.1 kg    Exam: General: Obese female in no acute distress. Cardiovascular:  Heart sounds show a slightly tachycardic rate, and regular rhythm. No gallops or rubs. No murmurs. No JVD. Lungs: Clear to auscultation bilaterally with good air movement. No rales, rhonchi or wheezes. Abdomen: Soft, slightly tender lower abdomen, nondistended with normal active bowel sounds. No masses. No hepatosplenomegaly. Neurological: Alert, non-focal. Skin: Warm and dry. No rashes or lesions. Extremities: No clubbing or cyanosis. No edema. Pedal pulses 2+. Psychiatric: Mood and affect are normal. Insight and judgment are fair.      Data Reviewed:   I have personally reviewed following labs and imaging studies:  Labs: Labs show the following:   Basic Metabolic Panel: Recent Labs  Lab 04/20/20 1748 04/20/20 1951 04/20/20 2243 04/21/20 0338 04/21/20 1330  NA 130* 127* 138 137 134*  K 5.4* 5.4* 4.0 4.0 3.7  CL 79*  --  87* 87* 94*  CO2 22  --  '25 29 26  '$ GLUCOSE 680*  --  197* 192* 181*  BUN 84*  --  86* 82* 33*  CREATININE 10.90*  --  11.16* 11.55* 6.31*  CALCIUM 8.3*  --  8.1* 7.5* 7.3*   GFR Estimated Creatinine Clearance: 13.3 mL/min (A) (by C-G formula based on SCr of 6.31 mg/dL (H)). Liver Function Tests: Recent Labs  Lab 04/20/20 1748  AST 14*  ALT <5  ALKPHOS 57  BILITOT 0.9  PROT 9.1*  ALBUMIN 3.6   Recent Labs  Lab 04/20/20 1748  LIPASE 53*   CBC: Recent Labs  Lab 04/20/20 1748 04/20/20 1951 04/20/20 2243  WBC 11.9*  --  11.4*  HGB 12.1 14.3 12.5  HCT 37.9 42.0 40.1  MCV 93.1  --  93.9  PLT 283  --  283   CBG: Recent Labs  Lab 04/21/20 0643 04/21/20 0749 04/21/20 1145 04/21/20 1340 04/21/20 1444  GLUCAP 241* 229* 172* 169* 145*   Hgb A1c: Recent Labs    04/20/20 2243  HGBA1C 9.2*   Microbiology Recent Results (from the past 240 hour(s))  SARS Coronavirus 2 by RT PCR (hospital order, performed in [facility] hospital lab) Nasopharyngeal Nasopharyngeal Swab     Status: None   Collection Time: 04/20/20  8:20 PM   Specimen:  Nasopharyngeal Swab  Result Value Ref Range Status   SARS Coronavirus 2 NEGATIVE NEGATIVE Final    Comment: (NOTE) SARS-CoV-2 target nucleic acids are NOT DETECTED.  The SARS-CoV-2 RNA is generally detectable in upper and lower respiratory specimens during the acute phase of infection. The lowest concentration of SARS-CoV-2 viral copies this assay can detect is 250 copies / mL. A negative result does not preclude SARS-CoV-2 infection and should not be used as the sole basis for treatment or other patient management decisions.  A negative result may occur with improper specimen collection / handling, submission of  specimen other than nasopharyngeal swab, presence of viral mutation(s) within the areas targeted by this assay, and inadequate number of viral copies (<250 copies / mL). A negative result must be combined with clinical observations, patient history, and epidemiological information.  Fact Sheet for Patients:   StrictlyIdeas.no  Fact Sheet for Healthcare Providers: BankingDealers.co.za  This test is not yet approved or  cleared by the [place] FDA and has been authorized for detection and/or diagnosis of SARS-CoV-2 by FDA under an Emergency Use Authorization (EUA).  This EUA will remain in effect (meaning this test can be used) for the duration of the COVID-19 declaration under Section 564(b)(1) of the Act, 21 U.S.C. section 360bbb-3(b)(1), unless the authorization is terminated or revoked sooner.  Performed at [facility], [place] [address] [place], [place] [address]     Procedures and diagnostic studies:  DG Chest 2 View  Result Date: 04/20/2020 CLINICAL DATA:  Chest pain EXAM: CHEST - 2 VIEW COMPARISON:  February 12, 2020 FINDINGS: The cardiomediastinal silhouette is unchanged and mildly enlarged in contour.Lower extremity CVC tip terminates over the inferior RIGHT atrium. No pleural effusion. No pneumothorax.  Mild interstitial prominence without overt edema. Surgical clips project over the upper abdomen. No acute osseous abnormality IMPRESSION: Mild interstitial prominence without overt edema. Electronically Signed   By: Valentino Saxon MD   On: 04/20/2020 18:27   CT Renal Stone Study  Result Date: 04/20/2020 CLINICAL DATA:  Flank pain.  Right lower quadrant pain. EXAM: CT ABDOMEN AND PELVIS WITHOUT CONTRAST TECHNIQUE: Multidetector CT imaging of the abdomen and pelvis was performed following the standard protocol without IV contrast. COMPARISON:  October 14, 2019 FINDINGS: Lower chest: The lung bases are clear. The heart size is normal. Hepatobiliary: The liver is normal. Status post cholecystectomy.There is no biliary ductal dilation. Pancreas: Normal contours without ductal dilatation. No peripancreatic fluid collection. Spleen: Unremarkable. Adrenals/Urinary Tract: --Adrenal glands: Unremarkable. --Right kidney/ureter: The right kidney is not visualized. --Left kidney/ureter: The left kidney is atrophic. --Urinary bladder: The urinary bladder is decompressed and therefore cannot be fully evaluated. Stomach/Bowel: --Stomach/Duodenum: There is a moderate-sized hiatal hernia. --Small bowel: Unremarkable. --Colon: Unremarkable. --Appendix: Normal. Vascular/Lymphatic: Normal course and caliber of the major abdominal vessels. There is a right femoral approach dialysis catheter with tip terminating near the cavoatrial junction. There is fat stranding in the right inguinal region about the right common femoral vein and artery. --No retroperitoneal lymphadenopathy. --No mesenteric lymphadenopathy. --there are few mildly enlarged right inguinal lymph nodes. Reproductive: Normal prostate and seminal vesicles. Other: There postsurgical changes in the right lower quadrant likely related to prior renal transplant. The abdominal wall is normal. Musculoskeletal. No acute displaced fractures. IMPRESSION: 1. There is a  well-positioned right femoral approach tunneled dialysis catheter. There is fat stranding in the right inguinal region about the right common femoral vein and artery. Findings are nonspecific and may be related to recent catheter placement versus a soft tissue infection or venous thrombosis. This is not well evaluated in the absence of IV contrast. 2. Normal appendix in the right lower quadrant. 3. Additional chronic findings as detailed above. Electronically Signed   By: Constance Holster M.D.   On: 04/20/2020 21:05    Medications:   . acetaminophen      . heparin  4,000 Units Dialysis Once in dialysis  . heparin  5,000 Units Subcutaneous Q8H  . heparin sodium (porcine)      . heparin sodium (porcine)      . insulin  glargine  10 Units Subcutaneous Daily  . metoprolol tartrate  5 mg Intravenous Once   Continuous Infusions: . sodium chloride    . sodium chloride    . dextrose 5% lactated ringers    . dextrose 5% lactated ringers 125 mL/hr at 04/21/20 1215  . insulin 1.6 Units/hr (04/21/20 1452)  . insulin    . lactated ringers Stopped (04/20/20 2250)  . lactated ringers       LOS: 1 day   Jacquelynn Cree, MD  [facility]   [facility] How to contact the [facility] Attending or Consulting provider [place] or covering provider during after hours [place], for this patient?  1. Check the care team in [facility] and look for a) attending/consulting [facility] provider listed and b) the [facility] team listed 2. Log into [email or web address] and use [place]'s universal password to access. If you do not have the password, please contact the hospital operator. 3. Locate the [facility] provider you are looking for under [facility] and page to a number that you can be directly reached. 4. If you still have difficulty reaching the provider, please page the [facility] (Director on Call) for the Hospitalists listed on amion for assistance.  04/21/2020, 3:23 PM

## 2020-04-22 DIAGNOSIS — N250 Renal osteodystrophy: Secondary | ICD-10-CM | POA: Diagnosis present

## 2020-04-22 DIAGNOSIS — E663 Overweight: Secondary | ICD-10-CM

## 2020-04-22 DIAGNOSIS — K449 Diaphragmatic hernia without obstruction or gangrene: Secondary | ICD-10-CM

## 2020-04-22 LAB — CBG MONITORING, ED
Glucose-Capillary: 117 mg/dL — ABNORMAL HIGH (ref 70–99)
Glucose-Capillary: 128 mg/dL — ABNORMAL HIGH (ref 70–99)
Glucose-Capillary: 143 mg/dL — ABNORMAL HIGH (ref 70–99)
Glucose-Capillary: 152 mg/dL — ABNORMAL HIGH (ref 70–99)
Glucose-Capillary: 165 mg/dL — ABNORMAL HIGH (ref 70–99)
Glucose-Capillary: 167 mg/dL — ABNORMAL HIGH (ref 70–99)
Glucose-Capillary: 176 mg/dL — ABNORMAL HIGH (ref 70–99)
Glucose-Capillary: 179 mg/dL — ABNORMAL HIGH (ref 70–99)
Glucose-Capillary: 218 mg/dL — ABNORMAL HIGH (ref 70–99)
Glucose-Capillary: 250 mg/dL — ABNORMAL HIGH (ref 70–99)
Glucose-Capillary: >600 mg/dL (ref 70–99)

## 2020-04-22 LAB — BASIC METABOLIC PANEL
Anion gap: 14 (ref 5–15)
Anion gap: 16 — ABNORMAL HIGH (ref 5–15)
BUN: 33 mg/dL — ABNORMAL HIGH (ref 6–20)
BUN: 36 mg/dL — ABNORMAL HIGH (ref 6–20)
CO2: 26 mmol/L (ref 22–32)
CO2: 24 mmol/L (ref 22–32)
Calcium: 7.3 mg/dL — ABNORMAL LOW (ref 8.9–10.3)
Calcium: 6.9 mg/dL — ABNORMAL LOW (ref 8.9–10.3)
Chloride: 94 mmol/L — ABNORMAL LOW (ref 98–111)
Chloride: 96 mmol/L — ABNORMAL LOW (ref 98–111)
Creatinine, Ser: 6.31 mg/dL — ABNORMAL HIGH (ref 0.44–1.00)
Creatinine, Ser: 7.45 mg/dL — ABNORMAL HIGH (ref 0.44–1.00)
GFR, Estimated: 8 mL/min — ABNORMAL LOW (ref >60)
GFR, Estimated: 7 mL/min — ABNORMAL LOW (ref >60)
Glucose, Bld: 181 mg/dL — ABNORMAL HIGH (ref 70–99)
Glucose, Bld: 208 mg/dL — ABNORMAL HIGH (ref 70–99)
Potassium: 3.7 mmol/L (ref 3.5–5.1)
Potassium: 4.1 mmol/L (ref 3.5–5.1)
Sodium: 134 mmol/L — ABNORMAL LOW (ref 135–145)
Sodium: 136 mmol/L (ref 135–145)

## 2020-04-22 LAB — GLUCOSE, CAPILLARY
Glucose-Capillary: 125 mg/dL — ABNORMAL HIGH (ref 70–99)
Glucose-Capillary: 76 mg/dL (ref 70–99)

## 2020-04-22 LAB — HCG, QUANTITATIVE, PREGNANCY: hCG, Beta Chain, Quant, S: 3 m[IU]/mL (ref <5)

## 2020-04-22 MED ORDER — INSULIN ASPART 100 UNIT/ML ~~LOC~~ SOLN
0.0000 [IU] | Freq: Three times a day (TID) | SUBCUTANEOUS | Status: DC
  Administered 2020-04-22: 2 [IU] via SUBCUTANEOUS
  Administered 2020-04-22: 3 [IU] via SUBCUTANEOUS
  Administered 2020-04-23: 2 [IU] via SUBCUTANEOUS

## 2020-04-22 MED ORDER — INSULIN ASPART 100 UNIT/ML ~~LOC~~ SOLN
3.0000 [IU] | Freq: Three times a day (TID) | SUBCUTANEOUS | Status: DC
  Administered 2020-04-22 (×2): 3 [IU] via SUBCUTANEOUS

## 2020-04-22 MED ORDER — DEXTROSE 5 % IV SOLN: INTRAVENOUS | Status: DC

## 2020-04-22 MED ORDER — HEPARIN SODIUM (PORCINE) 1000 UNIT/ML DIALYSIS: 4000.0000 [IU] | Freq: Once | INTRAMUSCULAR | Status: DC

## 2020-04-22 MED ORDER — MORPHINE SULFATE (PF) 4 MG/ML IV SOLN
4.0000 mg | Freq: Once | INTRAVENOUS | Status: AC
  Administered 2020-04-22: 4 mg via INTRAVENOUS
  Filled 2020-04-22: qty 1

## 2020-04-22 MED ORDER — INSULIN GLARGINE 100 UNIT/ML ~~LOC~~ SOLN
20.0000 [IU] | Freq: Every day | SUBCUTANEOUS | Status: DC
  Administered 2020-04-22 – 2020-04-23 (×2): 20 [IU] via SUBCUTANEOUS
  Filled 2020-04-22 (×2): qty 0.2

## 2020-04-22 MED ORDER — INSULIN DEGLUDEC 100 UNIT/ML ~~LOC~~ SOPN: 20.0000 [IU] | PEN_INJECTOR | Freq: Every day | SUBCUTANEOUS | Status: DC

## 2020-04-22 MED ORDER — SODIUM CHLORIDE 0.9 % IV SOLN: INTRAVENOUS | Status: DC

## 2020-04-22 MED ORDER — TRIMETHOBENZAMIDE HCL 100 MG/ML IM SOLN
200.0000 mg | Freq: Once | INTRAMUSCULAR | Status: AC
  Administered 2020-04-22: 200 mg via INTRAMUSCULAR
  Filled 2020-04-22: qty 2

## 2020-04-22 MED ORDER — INSULIN ASPART 100 UNIT/ML ~~LOC~~ SOLN: 0.0000 [IU] | Freq: Every day | SUBCUTANEOUS | Status: DC

## 2020-04-22 MED ORDER — OXYCODONE-ACETAMINOPHEN 5-325 MG PO TABS
1.0000 | ORAL_TABLET | ORAL | Status: DC | PRN
  Administered 2020-04-23: 1 via ORAL
  Filled 2020-04-22 (×2): qty 1

## 2020-04-22 MED ORDER — METOCLOPRAMIDE HCL 5 MG/ML IJ SOLN: 5.0000 mg | Freq: Once | INTRAMUSCULAR | Status: DC

## 2020-04-22 MED ORDER — OXYCODONE-ACETAMINOPHEN 5-325 MG PO TABS
1.0000 | ORAL_TABLET | Freq: Once | ORAL | Status: AC
  Administered 2020-04-22: 1 via ORAL
  Filled 2020-04-22: qty 1

## 2020-04-22 MED ORDER — HYDROMORPHONE HCL 1 MG/ML IJ SOLN: 1.0000 mg | INTRAMUSCULAR | Status: DC | PRN

## 2020-04-22 NOTE — ED Notes (Signed)
Pt again stating she is in pain, now states the pain is "everywhere". Pt offered percocet and refused. States she needs IV pain medicine because she is +nausea. Pt has not had any emesis today.

## 2020-04-22 NOTE — Progress Notes (Signed)
[facility] Progress Note  Subjective: seen on HD this am. No c/o.   Vitals:   04/22/20 0900 04/22/20 1045 04/22/20 1345 04/22/20 1439  BP: (!) 189/119 (!) 172/110 (!) 176/115 (!) 186/117  Pulse:  97  (!) 101  Resp: '16 20 18 18  '$ Temp:    98.3 F (36.8 C)  TempSrc:    Oral  SpO2:  99%  97%  Weight:      Height:        Exam:   alert, nad   no jvd  Chest cta bilat  Cor reg no RG  Abd soft ntnd no ascites   Ext no LE edema   Alert, NF, ox3   R fem TDC, L AVF +t/b     OP HD: MWF  4h  350/800  82.5kg  2K/ 3.5Ca bath  P4  TDC  Hep 4000+ 2046mdrun  - mircera 225aq last 1/24, due today   Assessment/ Plan: 1. DMT1 w/ HONK and mild DKA - w/ N/V, abd pain. Transferring to SQ insulin today, IV insulin off.  2. HTN/ volume - euvolemic and at dry wt, will dc NS at 50/hr.  3. ESRD - HD MWF. HD tomorrow.  4. [facility]  5. Renal osteodystrophy - will check phos but continue binders for now. 6. Anemia ckd - @ goal 12 (last rx Mircera 04/07/20) 7. H/o SVT - during last admission 02/2020 initially on amio but then plan was for 30 day even monitor and continue BB. 8. HTN - cont home meds metoprolol and norvasc. At dry wt.  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   Inpatient medications:  famotidine  10 mg Oral Daily   heparin  4,000 Units Dialysis Once in dialysis   heparin  5,000 Units Subcutaneous Q8H   insulin aspart  0-5 Units Subcutaneous QHS   insulin aspart  0-9 Units Subcutaneous TID WC   insulin aspart  3 Units Subcutaneous TID WC   insulin glargine  20 Units  Subcutaneous Daily   metoprolol tartrate  5 mg Intravenous Once   trimethobenzamide  200 mg Intramuscular Once    dextrose Stopped (04/22/20 1237)   acetaminophen, alteplase, calcium carbonate, dextrose, dextrose, heparin, lidocaine (PF), lidocaine-prilocaine, oxyCODONE-acetaminophen, pentafluoroprop-tetrafluoroeth, promethazine

## 2020-04-22 NOTE — Plan of Care (Signed)
  Problem: Fluid Volume: Goal: Compliance with measures to maintain balanced fluid volume will improve Outcome: Progressing   Problem: Health Behavior/Discharge Planning: Goal: Ability to manage health-related needs will improve Outcome: Progressing   Problem: Nutritional: Goal: Ability to make healthy dietary choices will improve Outcome: Progressing

## 2020-04-22 NOTE — ED Notes (Signed)
Pt crying about room being cold, given warm blankets. Pt requesting a different room. Pt informed there arent any open rooms at this time. Pt also c/o pain in her abdomen. This RN offered pt tylenol to which pt refused. admitting MD paged regarding pain medications.

## 2020-04-22 NOTE — Progress Notes (Addendum)
Progress Note    [name]  [id number] DOB: 04-26-1985  DOA: 04/20/2020 PCP: [name], MD    Brief Narrative:   Chief complaint: Right sided abdominal pain, chest pain, nausea, vomiting, diarrhea  Medical records reviewed and are as summarized below:  [name] is an 35 y.o. female with a PMH of DMT1,hypertension, hyperlipidemia, ESRD on dialysiswho presented to the ED via EMS with nausea, vomiting and chest/abdominal pain for the past 2 days PTA, associated with hyperglycemia. In the ED, found to have blood sugar over 600.  He was not acidotic on ABG.  Beta hydroxybutyrate level was elevated.  She was started on insulin infusion in the emergency room.  CT the abdomen was obtained and no renal stones were noted.  Right femoral vein catheter was in good position with no signs of perforation or free air.  Hospitalist service was asked to admit for further management  Assessment/Plan:   Principal Problem:   Type 1 diabetes mellitus with HONK and early DKA, POA  Continue HHS/DKA protocol and remains on an insulin infusion until her anion gap closes. Initial anion gap was 29 but CO2 was 22.  Beta-hydroxybutyric acid 1.28, consistent with early DKA. Most recent BMET showed anion gap still elevated at 16.  Blood sugars are being monitored every hour.   Hemoglobin A1c 9.2%.  DM coordinator consulted. Will transition to basal/bolus insulin.  Still a bit dry given gap.  Will gently hydrate with NS at 50 cc/hr and continue to monitor chemistries closesly.  Active Problems:   Abdominal pain, nausea, POA, acute CT done on admission.  No acute findings.  Has a moderate-sized hiatal hernia.  ? Diabetic gastroparesis. Reglan contraindicated due to QT prolongation. May need to consider gastric emptying study as an outpatient. Continue symptomatic treatment with Percocet and phenergan.  Avoid IV narcotics.    Anemia of chronic disease, POA No significant drop in hgb despite patient  reporting black stools. Check hemoccult.    Essential hypertension, benign, chronic, POA We will monitor blood pressure.  IV metoprolol will be provided while patient is n.p.o. and not able to tolerate p.o. intake    Intractable nausea and vomiting, POA, improving  Phenergan provided IV for nausea and vomiting.  Kept n.p.o. until nausea and vomiting improves and blood sugar is controlled.    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  Renal following.  On binders.     Hyponatremia, acute, resolved, POA Resolved.    GERD (gastroesophageal reflux disease)/hiatal hernia, chronic, POA Protonix contraindicated due to prolonged QT interval.  Now on Pepcid.    Prolonged QT interval Avoid medications which could further prolong QT.    Elevated beta HCG Interestingly, was elevated 2 months ago and 10 months ago. Repeat test and if rising, may need an ultrasound to confirm pregnancy.  Overweight Follow-up with PCP for dietary, lifestyle, pharmacotherapy interventions for weight loss.   Nutritional status        Body mass index is 29.21 kg/m.   Family Communication/Anticipated D/C date and plan/Code Status   DVT prophylaxis: heparin injection 5,000 Units Start: 04/20/20 2245   Current Level of Care:: Med-Surg Code Status: Full Code.  Family Communication: Patient updated. Disposition Plan: Status is: Inpatient  Remains inpatient appropriate because:Remains on IV insulin, anion gap still elevated. Transitioning to basal bolus, but remains ill with abdominal pain, nausea, inability to tolerate POs   Dispo: The patient is from: Home  Anticipated d/c is to: Home              Anticipated d/c date is: 1 day              Patient currently is not medically stable to d/c.   Difficult to place patient: No  Medical Consultants:    Renal.   Anti-Infectives:    None  Subjective:   Patient reports ongoing nausea, some vomiting  up gastric secretion, and reported having a formed stool that was "black." States dilaudid hasn't helped.   Objective:    Vitals:   04/22/20 0530 04/22/20 0545 04/22/20 0600 04/22/20 0800  BP: (!) 148/103 (!) 159/112 (!) 121/91 (!) 161/104  Pulse:    92  Resp: 17 17 (!) 0 14  Temp:      TempSrc:      SpO2:    99%  Weight:      Height:        Intake/Output Summary (Last 24 hours) at 04/22/2020 0952 Last data filed at 04/21/2020 1018 Gross per 24 hour  Intake -  Output 58 ml  Net -58 ml   Filed Weights   04/21/20 0255 04/21/20 0715 04/21/20 1100  Weight: 84.6 kg 82.1 kg 82.1 kg    Exam: General: No acute distress. Obese female, spitting up gastric secretions in emesis bag. Cardiovascular: Heart sounds show a regular rate, and rhythm. No gallops or rubs. No murmurs. No JVD. Lungs: Clear to auscultation bilaterally with good air movement. No rales, rhonchi or wheezes. Abdomen: Soft, diffusely tender, nondistended with normal active bowel sounds. No masses. No hepatosplenomegaly. Neurological: Non-focal, no deficits. Skin: Warm and dry. No rashes or lesions. Extremities: No clubbing or cyanosis. No edema. Pedal pulses 2+. Psychiatric: Mood and affect are flat. Insight and judgment are fair.      Data Reviewed:   I have personally reviewed following labs and imaging studies:  Labs: Labs show the following:   Basic Metabolic Panel: Recent Labs  Lab 04/20/20 2243 04/21/20 0338 04/21/20 1330 04/21/20 1852 04/22/20 0517  NA 138 137 134* 133* 136  K 4.0 4.0 3.7 3.6 4.1  CL 87* 87* 94* 92* 96*  CO2 '25 29 26 28 24  '$ GLUCOSE 197* 192* 181* 129* 208*  BUN 86* 82* 33* 33* 36*  CREATININE 11.16* 11.55* 6.31* 6.98* 7.45*  CALCIUM 8.1* 7.5* 7.3* 7.1* 6.9*   GFR Estimated Creatinine Clearance: 11.3 mL/min (A) (by C-G formula based on SCr of 7.45 mg/dL (H)). Liver Function Tests: Recent Labs  Lab 04/20/20 1748  AST 14*  ALT <5  ALKPHOS 57  BILITOT 0.9  PROT 9.1*   ALBUMIN 3.6   Recent Labs  Lab 04/20/20 1748  LIPASE 53*   CBC: Recent Labs  Lab 04/20/20 1748 04/20/20 1951 04/20/20 2243  WBC 11.9*  --  11.4*  HGB 12.1 14.3 12.5  HCT 37.9 42.0 40.1  MCV 93.1  --  93.9  PLT 283  --  283   CBG: Recent Labs  Lab 04/22/20 0354 04/22/20 0512 04/22/20 0625 04/22/20 0725 04/22/20 0835  GLUCAP 117* 176* 152* 128* 165*   Hgb A1c: Recent Labs    04/20/20 2243  HGBA1C 9.2*   Microbiology Recent Results (from the past 240 hour(s))  SARS Coronavirus 2 by RT PCR (hospital order, performed in [place] hospital lab) Nasopharyngeal Nasopharyngeal Swab     Status: None   Collection Time: 04/20/20  8:20 PM   Specimen: Nasopharyngeal Swab  Result Value Ref  Range Status   SARS Coronavirus 2 NEGATIVE NEGATIVE Final    Comment: (NOTE) SARS-CoV-2 target nucleic acids are NOT DETECTED.  The SARS-CoV-2 RNA is generally detectable in upper and lower respiratory specimens during the acute phase of infection. The lowest concentration of SARS-CoV-2 viral copies this assay can detect is 250 copies / mL. A negative result does not preclude SARS-CoV-2 infection and should not be used as the sole basis for treatment or other patient management decisions.  A negative result may occur with improper specimen collection / handling, submission of specimen other than nasopharyngeal swab, presence of viral mutation(s) within the areas targeted by this assay, and inadequate number of viral copies (<250 copies / mL). A negative result must be combined with clinical observations, patient history, and epidemiological information.  Fact Sheet for Patients:   StrictlyIdeas.no  Fact Sheet for Healthcare Providers: BankingDealers.co.za  This test is not yet approved or  cleared by the [place] FDA and has been authorized for detection and/or diagnosis of SARS-CoV-2 by FDA under an Emergency Use Authorization  (EUA).  This EUA will remain in effect (meaning this test can be used) for the duration of the COVID-19 declaration under Section 564(b)(1) of the Act, 21 U.S.C. section 360bbb-3(b)(1), unless the authorization is terminated or revoked sooner.  Performed at [facility], [place] [address], [place], [place] [address]     Procedures and diagnostic studies:  DG Chest 2 View  Result Date: 04/20/2020 CLINICAL DATA:  Chest pain EXAM: CHEST - 2 VIEW COMPARISON:  February 12, 2020 FINDINGS: The cardiomediastinal silhouette is unchanged and mildly enlarged in contour.Lower extremity CVC tip terminates over the inferior RIGHT atrium. No pleural effusion. No pneumothorax. Mild interstitial prominence without overt edema. Surgical clips project over the upper abdomen. No acute osseous abnormality IMPRESSION: Mild interstitial prominence without overt edema. Electronically Signed   By: Valentino Saxon MD   On: 04/20/2020 18:27   CT Renal Stone Study  Result Date: 04/20/2020 CLINICAL DATA:  Flank pain.  Right lower quadrant pain. EXAM: CT ABDOMEN AND PELVIS WITHOUT CONTRAST TECHNIQUE: Multidetector CT imaging of the abdomen and pelvis was performed following the standard protocol without IV contrast. COMPARISON:  October 14, 2019 FINDINGS: Lower chest: The lung bases are clear. The heart size is normal. Hepatobiliary: The liver is normal. Status post cholecystectomy.There is no biliary ductal dilation. Pancreas: Normal contours without ductal dilatation. No peripancreatic fluid collection. Spleen: Unremarkable. Adrenals/Urinary Tract: --Adrenal glands: Unremarkable. --Right kidney/ureter: The right kidney is not visualized. --Left kidney/ureter: The left kidney is atrophic. --Urinary bladder: The urinary bladder is decompressed and therefore cannot be fully evaluated. Stomach/Bowel: --Stomach/Duodenum: There is a moderate-sized hiatal hernia. --Small bowel: Unremarkable. --Colon: Unremarkable. --Appendix:  Normal. Vascular/Lymphatic: Normal course and caliber of the major abdominal vessels. There is a right femoral approach dialysis catheter with tip terminating near the cavoatrial junction. There is fat stranding in the right inguinal region about the right common femoral vein and artery. --No retroperitoneal lymphadenopathy. --No mesenteric lymphadenopathy. --there are few mildly enlarged right inguinal lymph nodes. Reproductive: Normal prostate and seminal vesicles. Other: There postsurgical changes in the right lower quadrant likely related to prior renal transplant. The abdominal wall is normal. Musculoskeletal. No acute displaced fractures. IMPRESSION: 1. There is a well-positioned right femoral approach tunneled dialysis catheter. There is fat stranding in the right inguinal region about the right common femoral vein and artery. Findings are nonspecific and may be related to recent catheter placement versus a soft tissue  infection or venous thrombosis. This is not well evaluated in the absence of IV contrast. 2. Normal appendix in the right lower quadrant. 3. Additional chronic findings as detailed above. Electronically Signed   By: Constance Holster M.D.   On: 04/20/2020 21:05    Medications:   . famotidine  10 mg Oral Daily  . heparin  4,000 Units Dialysis Once in dialysis  . heparin  5,000 Units Subcutaneous Q8H  . insulin aspart  0-5 Units Subcutaneous QHS  . insulin aspart  0-9 Units Subcutaneous TID WC  . insulin aspart  3 Units Subcutaneous TID WC  . insulin glargine  20 Units Subcutaneous Daily  . metoprolol tartrate  5 mg Intravenous Once   Continuous Infusions: . sodium chloride    . insulin 0.6 Units/hr (04/22/20 0733)     LOS: 2 days   Jacquelynn Cree, MD  [facility]   [facility] How to contact the [facility] Attending or Consulting provider [place] or covering provider during after hours [place], for this patient?  1. Check the care team in [facility] and look for a)  attending/consulting [facility] provider listed and b) the [facility] team listed 2. Log into [email or web address] and use [place]'s universal password to access. If you do not have the password, please contact the hospital operator. 3. Locate the [facility] provider you are looking for under [facility] and page to a number that you can be directly reached. 4. If you still have difficulty reaching the provider, please page the [facility] (Director on Call) for the Hospitalists listed on amion for assistance.  04/22/2020, 9:52 AM

## 2020-04-22 NOTE — ED Notes (Signed)
Pt co of abd pain, this RN contacted secretary page out to oncall provider for per pt request.

## 2020-04-23 LAB — GLUCOSE, CAPILLARY
Glucose-Capillary: 111 mg/dL — ABNORMAL HIGH (ref 70–99)
Glucose-Capillary: 184 mg/dL — ABNORMAL HIGH (ref 70–99)

## 2020-04-23 LAB — CBC
HCT: 31.4 % — ABNORMAL LOW (ref 36.0–46.0)
Hemoglobin: 9.5 g/dL — ABNORMAL LOW (ref 12.0–15.0)
MCH: 29.6 pg (ref 26.0–34.0)
MCHC: 30.3 g/dL (ref 30.0–36.0)
MCV: 97.8 fL (ref 80.0–100.0)
Platelets: 290 10*3/uL (ref 150–400)
RBC: 3.21 MIL/uL — ABNORMAL LOW (ref 3.87–5.11)
RDW: 17.2 % — ABNORMAL HIGH (ref 11.5–15.5)
WBC: 6.3 10*3/uL (ref 4.0–10.5)
nRBC: 0 % (ref 0.0–0.2)

## 2020-04-23 LAB — RENAL FUNCTION PANEL
Albumin: 2.5 g/dL — ABNORMAL LOW (ref 3.5–5.0)
Anion gap: 15 (ref 5–15)
BUN: 43 mg/dL — ABNORMAL HIGH (ref 6–20)
CO2: 24 mmol/L (ref 22–32)
Calcium: 6.7 mg/dL — ABNORMAL LOW (ref 8.9–10.3)
Chloride: 98 mmol/L (ref 98–111)
Creatinine, Ser: 10.24 mg/dL — ABNORMAL HIGH (ref 0.44–1.00)
GFR, Estimated: 5 mL/min — ABNORMAL LOW (ref >60)
Glucose, Bld: 153 mg/dL — ABNORMAL HIGH (ref 70–99)
Phosphorus: 5.7 mg/dL — ABNORMAL HIGH (ref 2.5–4.6)
Potassium: 4.1 mmol/L (ref 3.5–5.1)
Sodium: 137 mmol/L (ref 135–145)

## 2020-04-23 MED ORDER — METOCLOPRAMIDE HCL 10 MG PO TABS: 10.0000 mg | ORAL_TABLET | Freq: Three times a day (TID) | ORAL | 0 refills | Status: DC

## 2020-04-23 MED ORDER — CALCIUM ACETATE (PHOS BINDER) 667 MG PO CAPS: 1334.0000 mg | ORAL_CAPSULE | Freq: Three times a day (TID) | ORAL | 0 refills | Status: AC

## 2020-04-23 MED ORDER — DARBEPOETIN ALFA 200 MCG/0.4ML IJ SOSY: 200.0000 ug | PREFILLED_SYRINGE | INTRAMUSCULAR | Status: DC

## 2020-04-23 MED ORDER — OXYCODONE-ACETAMINOPHEN 5-325 MG PO TABS: 1.0000 | ORAL_TABLET | Freq: Two times a day (BID) | ORAL | 0 refills | Status: DC | PRN

## 2020-04-23 MED ORDER — CALCIUM ACETATE (PHOS BINDER) 667 MG PO CAPS: 1334.0000 mg | ORAL_CAPSULE | Freq: Three times a day (TID) | ORAL | Status: DC

## 2020-04-23 NOTE — Progress Notes (Signed)
DISCHARGE NOTE HOME [name] to be discharged Home per MD order. Discussed prescriptions and follow up appointments with the patient. Prescriptions given to patient; medication list explained in detail. Patient verbalized understanding.  Skin clean, dry and intact without evidence of skin break down, no evidence of skin tears noted. IV catheter discontinued intact. Site without signs and symptoms of complications. Dressing and pressure applied. Pt denies pain at the site currently. No complaints noted.  Patient free of lines, drains, and wounds.   An After Visit Summary (AVS) was printed and given to the patient. Patient escorted via wheelchair, and discharged home via private auto.  Arlyss Repress, RN

## 2020-04-23 NOTE — Progress Notes (Signed)
Pt c/o being thirsty and hungry  Demanding for the doctor to be called for Ice chips, Dr Marlowe Sax notified see physicians orders .I took some clear liquid items to her room so she could choose what flavor she liked,pt stated" I did not ask for the broth or icee or jello"  I explained to the pt the doctor put her on a clear lIquid diet so she colud have something to eat,she did not want the jello,soda,broth or icee , Pt only wanted ice water to drink in place of a clear liquid diet , explained to pt we would have to monitor her fluid intake since she was a renal pt. Pt got upset and stated" I drank and ate anything I wanted in the ED and  why are you denying to give me ice water" as I was explaining she stated"I don"t want to hear what you have to say just take this clear liquid food and get out of my room and don"t come back!!

## 2020-04-23 NOTE — Discharge Summary (Signed)
Physician Discharge Summary  [name] [id number] DOB: [birth year] DOA: 04/20/2020  PCP: [name], MD  Admit date: 04/20/2020 Discharge date: 04/23/2020  Admitted From: Home Disposition: Home  Recommendations for Outpatient Follow-up:  1. Follow up with PCP in 1 week  2. Outpatient follow-up with dialysis as scheduled 3. Follow up in ED if symptoms worsen or new appear   Home Health: No Equipment/Devices: None  Discharge Condition: Stable CODE STATUS: Full Diet recommendation: Heart healthy/carb modified/renal hemodialysis diet  Brief/Interim Summary: 35 y.o. female with a PMH ofdiabetes mellitus type 1,hypertension, hyperlipidemia, ESRD on dialysiswhopresentedto the ED via EMS withnausea,vomiting and chest/abdominal painfor the past 2 days PTA, associated with hyperglycemia. In the ED, found to have blood sugar over 600. She was not acidotic on ABG. Beta hydroxybutyrate level waselevated. She was started on insulin infusion in the emergency room. CT the abdomen was obtained and no renal stones were noted. Right femoral vein catheter was in good position with no signs of perforation or free air.  During hospitalization, nephrology was also consulted.  Her condition gradually improved.  Diet has been advanced and she has tolerated soft food this morning.  Nephrology has cleared the patient for discharge.  She will be discharged home with outpatient follow-up with PCP and dialysis unit as scheduled  Discharge Diagnoses:   HONK and early DKA in a patient with diabetes mellitus type 1: Present on admission -Presented with nausea, vomiting and abdominal pain along with hyperglycemia, elevated anion gap and elevated beta hydroxybutyrate levels.  Treated with insulin drip and subsequently transitioned to long-acting insulin. -Patient's diet has been advanced this morning.  She is tolerating diet.  Continue carb modified diet at home -A1c 9.2 -Continue home regimen.   Outpatient follow-up with PCP  Abdominal pain, nausea ?  Diabetic gastroparesis -CT of the abdomen did not show any acute findings but showed moderate sized hiatal hernia -Resume Reglan on discharge.  Outpatient follow-up with GI  Anemia of chronic disease -Hemoglobin stable.  Outpatient follow-up  Essential hypertension -Patient is not on amlodipine as an outpatient.  Continue metoprolol.  Outpatient follow-up  End-stage renal disease on hemodialysis -Nephrology following.  Resume outpatient dialysis as scheduled  GERD -Continue Protonix   Discharge Instructions  Discharge Instructions    Diet - low sodium heart healthy   Complete by: As directed    Diet Carb Modified   Complete by: As directed    Increase activity slowly   Complete by: As directed    No wound care   Complete by: As directed      Allergies as of 04/23/2020      Reactions   Benadryl [diphenhydramine-zinc Acetate] Anaphylaxis   Stopped breathing in ICU   Diphenhydramine Anaphylaxis   Doxycycline Shortness Of Breath   Other reaction(s): Other (See Comments) Shortness of Breath  Shortness of Breath  Shortness of Breath    Motrin [ibuprofen] Shortness Of Breath, Itching   Per pt   Shellfish-derived Products    hives   Shellfish Allergy Hives   Banana Other (See Comments), Itching, Nausea And Vomiting   Sick on the stomach Other reaction(s): Other (See Comments) Sick on the stomach Sick on the stomach   Chlorhexidine Itching   Ferrous Sulfate Itching   Iron Dextran Other (See Comments), Itching   Vein irritation REACTION: vein irritation REACTION: vein irritation REACTION: vein irritation REACTION: vein irritation      Medication List    STOP taking these medications   amLODipine 5 MG  tablet Commonly known as: NORVASC   Hyoscyamine Sulfate SL 0.125 MG Subl Commonly known as: Levsin/SL   lidocaine 2 % solution Commonly known as: XYLOCAINE     TAKE these medications   Accu-Chek  Softclix Lancets lancets Use to check blood sugar 4 times per day.   acetaminophen 500 MG tablet Commonly known as: TYLENOL Take 1,000 mg by mouth every 6 (six) hours as needed for headache (pain).   calcium acetate 667 MG capsule Commonly known as: PHOSLO Take 2 capsules (1,334 mg total) by mouth 3 (three) times daily with meals.   calcium carbonate (dosed in mg elemental calcium) 1250 MG/5ML Susp Take 5 mLs by mouth 3 (three) times daily.   cetirizine 10 MG tablet Commonly known as: ZYRTEC Take 10 mg by mouth daily as needed for allergies.   D 1000 25 MCG (1000 UT) capsule Generic drug: Cholecalciferol Take 1,000 Units by mouth every other day.   fluticasone 50 MCG/ACT nasal spray Commonly known as: FLONASE Place 2 sprays into both nostrils daily as needed for allergies.   gabapentin 400 MG capsule Commonly known as: NEURONTIN Take 400 mg by mouth 3 (three) times daily.   hydrOXYzine 50 MG tablet Commonly known as: ATARAX/VISTARIL Take 1 tablet (50 mg total) by mouth 2 (two) times daily as needed for itching.   insulin aspart 100 UNIT/ML FlexPen Commonly known as: NOVOLOG Inject 2-10 Units into the skin 3 (three) times daily with meals. as needed for blood sugar management (sliding scale)   insulin degludec 100 UNIT/ML FlexTouch Pen Commonly known as: TRESIBA Inject 20 Units into the skin daily.   lidocaine-prilocaine cream Commonly known as: EMLA Apply 1 application topically See admin instructions. Apply topically one hour prior to dialysis on Monday, Wednesday, Friday   loperamide 1 MG/5ML solution Commonly known as: IMODIUM Take 2 mg by mouth 2 (two) times daily as needed for diarrhea or loose stools.   metoCLOPramide 10 MG tablet Commonly known as: REGLAN Take 1 tablet (10 mg total) by mouth 3 (three) times daily. What changed: Another medication with the same name was removed. Continue taking this medication, and follow the directions you see here.    metoprolol tartrate 25 MG tablet Commonly known as: LOPRESSOR Take 1 tablet (25 mg total) by mouth 2 (two) times daily.   MIRCERA IJ Mircera   oxyCODONE-acetaminophen 5-325 MG tablet Commonly known as: PERCOCET/ROXICET Take 1 tablet by mouth 2 (two) times daily as needed for moderate pain.   pantoprazole 40 MG tablet Commonly known as: PROTONIX Take 40 mg by mouth 2 (two) times daily.   promethazine 25 MG tablet Commonly known as: PHENERGAN Take 25 mg by mouth every 6 (six) hours as needed for nausea or vomiting.   simvastatin 20 MG tablet Commonly known as: ZOCOR Take 20 mg by mouth at bedtime.       Follow-up Information    [name], MD. Schedule an appointment as soon as possible for a visit in 1 week(s).   Specialty: Internal Medicine Contact information: [place] [address] [phone]        Josue Hector, MD .   Specialty: Cardiology Contact information: [phone] [place] [address] [phone]              Allergies  Allergen Reactions  . Benadryl [Diphenhydramine-Zinc Acetate] Anaphylaxis    Stopped breathing in ICU  . Diphenhydramine Anaphylaxis  . Doxycycline Shortness Of Breath    Other reaction(s):  Other (See Comments) Shortness of Breath  Shortness of Breath  Shortness of Breath   . Motrin [Ibuprofen] Shortness Of Breath and Itching    Per pt  . Shellfish-Derived Products     hives  . Shellfish Allergy Hives  . Banana Other (See Comments), Itching and Nausea And Vomiting    Sick on the stomach Other reaction(s): Other (See Comments) Sick on the stomach Sick on the stomach  . Chlorhexidine Itching  . Ferrous Sulfate Itching  . Iron Dextran Other (See Comments) and Itching    Vein irritation REACTION: vein irritation REACTION: vein irritation REACTION: vein irritation REACTION: vein irritation    Consultations:  Nephrology   Procedures/Studies: DG Chest 2  View  Result Date: 04/20/2020 CLINICAL DATA:  Chest pain EXAM: CHEST - 2 VIEW COMPARISON:  February 12, 2020 FINDINGS: The cardiomediastinal silhouette is unchanged and mildly enlarged in contour.Lower extremity CVC tip terminates over the inferior RIGHT atrium. No pleural effusion. No pneumothorax. Mild interstitial prominence without overt edema. Surgical clips project over the upper abdomen. No acute osseous abnormality IMPRESSION: Mild interstitial prominence without overt edema. Electronically Signed   By: Valentino Saxon MD   On: 04/20/2020 18:27   CARDIAC EVENT MONITOR  Result Date: 03/26/2020 NSR Her symptoms of palpitations, tachycardia and dyspnea all showed only NSR with no arrhythmia One asymptomatic run of NSVT 13 beats   IR Fluoro Guide CV Line Right  Result Date: 04/18/2020 CLINICAL DATA:  End-stage renal disease with right thigh tunneled hemodialysis catheter. Right thigh pain and purulent drainage around exit site of tunneled catheter. EXAM: EXCHANGE OF TUNNELED CENTRAL VENOUS HEMODIALYSIS CATHETER UNDER FLUOROSCOPIC GUIDANCE ANESTHESIA/SEDATION: None MEDICATIONS: None FLUOROSCOPY TIME:  36 seconds.  14.0 mGy. PROCEDURE: The procedure, risks, benefits, and alternatives were explained to the patient. Questions regarding the procedure were encouraged and answered. The patient understands and consents to the procedure. A time-out was performed prior to initiating the procedure. The preexisting dialysis catheter and surrounding skin were prepped with chlorhexidine in a sterile fashion, and a sterile drape was applied covering the operative field. Maximum barrier sterile technique with sterile gowns and gloves were used for the procedure. Local anesthesia was provided with 1% lidocaine. The preexisting dialysis catheter was removed over 2 hydrophilic guidewires after freeing the subcutaneous cuff. A new Palindrome tunneled hemodialysis catheter measuring 44 cm from tip to cuff was chosen for  placement. This was advanced over the guidewire under fluoroscopy. Final catheter positioning was confirmed and documented with a fluoroscopic spot image. The catheter was aspirated, flushed with saline, and injected with appropriate volume heparin dwells. COMPLICATIONS: None. FINDINGS: After catheter exchange, the new catheter tip lies in the upper IVC nearly at the floor of the right atrium. The catheter aspirates normally and is ready for immediate use. IMPRESSION: Exchange of tunneled hemodialysis catheter via the right femoral vein. The catheter tip lies in the upper IVC nearly to the right atrium. The catheter is ready for immediate use. Electronically Signed   By: Aletta Edouard M.D.   On: 04/18/2020 16:41   CT Renal Stone Study  Result Date: 04/20/2020 CLINICAL DATA:  Flank pain.  Right lower quadrant pain. EXAM: CT ABDOMEN AND PELVIS WITHOUT CONTRAST TECHNIQUE: Multidetector CT imaging of the abdomen and pelvis was performed following the standard protocol without IV contrast. COMPARISON:  October 14, 2019 FINDINGS: Lower chest: The lung bases are clear. The heart size is normal. Hepatobiliary: The liver is normal. Status post cholecystectomy.There is no biliary ductal dilation. Pancreas:  Normal contours without ductal dilatation. No peripancreatic fluid collection. Spleen: Unremarkable. Adrenals/Urinary Tract: --Adrenal glands: Unremarkable. --Right kidney/ureter: The right kidney is not visualized. --Left kidney/ureter: The left kidney is atrophic. --Urinary bladder: The urinary bladder is decompressed and therefore cannot be fully evaluated. Stomach/Bowel: --Stomach/Duodenum: There is a moderate-sized hiatal hernia. --Small bowel: Unremarkable. --Colon: Unremarkable. --Appendix: Normal. Vascular/Lymphatic: Normal course and caliber of the major abdominal vessels. There is a right femoral approach dialysis catheter with tip terminating near the cavoatrial junction. There is fat stranding in the right  inguinal region about the right common femoral vein and artery. --No retroperitoneal lymphadenopathy. --No mesenteric lymphadenopathy. --there are few mildly enlarged right inguinal lymph nodes. Reproductive: Normal prostate and seminal vesicles. Other: There postsurgical changes in the right lower quadrant likely related to prior renal transplant. The abdominal wall is normal. Musculoskeletal. No acute displaced fractures. IMPRESSION: 1. There is a well-positioned right femoral approach tunneled dialysis catheter. There is fat stranding in the right inguinal region about the right common femoral vein and artery. Findings are nonspecific and may be related to recent catheter placement versus a soft tissue infection or venous thrombosis. This is not well evaluated in the absence of IV contrast. 2. Normal appendix in the right lower quadrant. 3. Additional chronic findings as detailed above. Electronically Signed   By: Constance Holster M.D.   On: 04/20/2020 21:05       Subjective: Patient seen and examined at bedside.  Feels better.  Denies any current vomiting.  No overnight fever, worsening shortness of breath reported.  Feels okay to go home today.  Discharge Exam: Vitals:   04/23/20 0521 04/23/20 1010  BP: 129/78 (!) 129/95  Pulse: 92 (!) 101  Resp: 18 18  Temp: 98.1 F (36.7 C) 98.7 F (37.1 C)  SpO2: 97% 95%    General: Pt is alert, awake, not in acute distress Cardiovascular: rate controlled, S1/S2 + Respiratory: bilateral decreased breath sounds at bases with some scattered crackles Abdominal: Soft, NT, ND, bowel sounds + Extremities: Trace lower extremity edema; no cyanosis    The results of significant diagnostics from this hospitalization (including imaging, microbiology, ancillary and laboratory) are listed below for reference.     Microbiology: Recent Results (from the past 240 hour(s))  SARS Coronavirus 2 by RT PCR (hospital order, performed in [facility] hospital  lab) Nasopharyngeal Nasopharyngeal Swab     Status: None   Collection Time: 04/20/20  8:20 PM   Specimen: Nasopharyngeal Swab  Result Value Ref Range Status   SARS Coronavirus 2 NEGATIVE NEGATIVE Final    Comment: (NOTE) SARS-CoV-2 target nucleic acids are NOT DETECTED.  The SARS-CoV-2 RNA is generally detectable in upper and lower respiratory specimens during the acute phase of infection. The lowest concentration of SARS-CoV-2 viral copies this assay can detect is 250 copies / mL. A negative result does not preclude SARS-CoV-2 infection and should not be used as the sole basis for treatment or other patient management decisions.  A negative result may occur with improper specimen collection / handling, submission of specimen other than nasopharyngeal swab, presence of viral mutation(s) within the areas targeted by this assay, and inadequate number of viral copies (<250 copies / mL). A negative result must be combined with clinical observations, patient history, and epidemiological information.  Fact Sheet for Patients:   StrictlyIdeas.no  Fact Sheet for Healthcare Providers: BankingDealers.co.za  This test is not yet approved or  cleared by the [place] FDA and has been authorized for detection  and/or diagnosis of SARS-CoV-2 by FDA under an Emergency Use Authorization (EUA).  This EUA will remain in effect (meaning this test can be used) for the duration of the COVID-19 declaration under Section 564(b)(1) of the Act, 21 U.S.C. section 360bbb-3(b)(1), unless the authorization is terminated or revoked sooner.  Performed at [facility], [place] [address] [place], [place] [address]      Labs: BNP (last 3 results) No results for input(s): BNP in the last 8760 hours. Basic Metabolic Panel: Recent Labs  Lab 04/21/20 0338 04/21/20 1330 04/21/20 1852 04/22/20 0517 04/23/20 0318  NA 137 134* 133* 136 137  K 4.0 3.7 3.6  4.1 4.1  CL 87* 94* 92* 96* 98  CO2 '29 26 28 24 24  '$ GLUCOSE 192* 181* 129* 208* 153*  BUN 82* 33* 33* 36* 43*  CREATININE 11.55* 6.31* 6.98* 7.45* 10.24*  CALCIUM 7.5* 7.3* 7.1* 6.9* 6.7*  PHOS  --   --   --   --  5.7*   Liver Function Tests: Recent Labs  Lab 04/20/20 1748 04/23/20 0318  AST 14*  --   ALT <5  --   ALKPHOS 57  --   BILITOT 0.9  --   PROT 9.1*  --   ALBUMIN 3.6 2.5*   Recent Labs  Lab 04/20/20 1748  LIPASE 53*   No results for input(s): AMMONIA in the last 168 hours. CBC: Recent Labs  Lab 04/20/20 1748 04/20/20 1951 04/20/20 2243 04/23/20 0318  WBC 11.9*  --  11.4* 6.3  HGB 12.1 14.3 12.5 9.5*  HCT 37.9 42.0 40.1 31.4*  MCV 93.1  --  93.9 97.8  PLT 283  --  283 290   Cardiac Enzymes: No results for input(s): CKTOTAL, CKMB, CKMBINDEX, TROPONINI in the last 168 hours. BNP: Invalid input(s): POCBNP CBG: Recent Labs  Lab 04/22/20 1636 04/22/20 1904 04/22/20 2131 04/23/20 0104 04/23/20 0641  GLUCAP 167* 125* 76 111* 184*   D-Dimer No results for input(s): DDIMER in the last 72 hours. Hgb A1c Recent Labs    04/20/20 2243  HGBA1C 9.2*   Lipid Profile No results for input(s): CHOL, HDL, LDLCALC, TRIG, CHOLHDL, LDLDIRECT in the last 72 hours. Thyroid function studies No results for input(s): TSH, T4TOTAL, T3FREE, THYROIDAB in the last 72 hours.  Invalid input(s): FREET3 Anemia work up No results for input(s): VITAMINB12, FOLATE, FERRITIN, TIBC, IRON, RETICCTPCT in the last 72 hours. Urinalysis    Component Value Date/Time   COLORURINE RED (A) 09/03/2018 1118   APPEARANCEUR CLOUDY (A) 09/03/2018 1118   LABSPEC 1.015 09/03/2018 1118   PHURINE 8.0 09/03/2018 1118   GLUCOSEU 250 (A) 09/03/2018 1118   HGBUR LARGE (A) 09/03/2018 1118   HGBUR negative 01/26/2010 1026   BILIRUBINUR NEGATIVE 09/03/2018 1118   BILIRUBINUR NEG 01/01/2011 1050   KETONESUR NEGATIVE 09/03/2018 1118   PROTEINUR >300 (A) 09/03/2018 1118   UROBILINOGEN 0.2  01/09/2015 1333   NITRITE POSITIVE (A) 09/03/2018 1118   LEUKOCYTESUR TRACE (A) 09/03/2018 1118   Sepsis Labs Invalid input(s): PROCALCITONIN,  WBC,  LACTICIDVEN Microbiology Recent Results (from the past 240 hour(s))  SARS Coronavirus 2 by RT PCR (hospital order, performed in [place] hospital lab) Nasopharyngeal Nasopharyngeal Swab     Status: None   Collection Time: 04/20/20  8:20 PM   Specimen: Nasopharyngeal Swab  Result Value Ref Range Status   SARS Coronavirus 2 NEGATIVE NEGATIVE Final    Comment: (NOTE) SARS-CoV-2 target nucleic acids are NOT DETECTED.  The  SARS-CoV-2 RNA is generally detectable in upper and lower respiratory specimens during the acute phase of infection. The lowest concentration of SARS-CoV-2 viral copies this assay can detect is 250 copies / mL. A negative result does not preclude SARS-CoV-2 infection and should not be used as the sole basis for treatment or other patient management decisions.  A negative result may occur with improper specimen collection / handling, submission of specimen other than nasopharyngeal swab, presence of viral mutation(s) within the areas targeted by this assay, and inadequate number of viral copies (<250 copies / mL). A negative result must be combined with clinical observations, patient history, and epidemiological information.  Fact Sheet for Patients:   StrictlyIdeas.no  Fact Sheet for Healthcare Providers: BankingDealers.co.za  This test is not yet approved or  cleared by the [place] FDA and has been authorized for detection and/or diagnosis of SARS-CoV-2 by FDA under an Emergency Use Authorization (EUA).  This EUA will remain in effect (meaning this test can be used) for the duration of the COVID-19 declaration under Section 564(b)(1) of the Act, 21 U.S.C. section 360bbb-3(b)(1), unless the authorization is terminated or revoked sooner.  Performed at [facility], [place] [address], [place], [place] [address]      Time coordinating discharge: 35 minutes  SIGNED:   [name], MD  [facility] 04/23/2020, 11:07 AM

## 2020-04-23 NOTE — Progress Notes (Addendum)
Subjective: Seen and examined in room, currently said feeling better trying to eat rest of her  Breakfast. Feels okay for discharge.  Try to arrange outpatient but unfortunately I called her outpatient kidney center, no room currently at OP center as "2 staff members are leaving with sickness.now."  Objective Vital signs in last 24 hours: Vitals:   04/22/20 1740 04/22/20 1855 04/22/20 2112 04/23/20 0521  BP:  (!) 134/96 (!) 156/93 129/78  Pulse:  (!) 109 96 92  Resp:  '16 18 18  '$ Temp: 98.2 F (36.8 C) 98.6 F (37 C) 98.6 F (37 C) 98.1 F (36.7 C)  TempSrc: Oral Oral    SpO2:  98% 100% 97%  Weight:      Height:       Weight change:   Physical Exam: General: Alert obese chronically ill female NAD Heart: RRR, no MRG Lungs: CTA, nonlabored breathing Abdomen: Obese, bowel sounds normoactive, soft nontender nondistended no ascites Extremities: No pedal edema Dialysis Access: Right  Femoral  PermCath, faint bruit left AV fistula   OP HD:  [facility] MWF  4h  350/800  82.5kg  2K/ 3.5Ca bath  P4  TDC  Hep 4000+ 2063mdrun  - mircera 225aq last 1/24  Problem/Plan: 1. DMT1 w/ HONK and mild DKA - w/ N/V, abd pain.   Treatment plan per admit.  2. HTN/ volume - euvolemic and at dry wt.  3. ESRD - HD MWF.  On schedule in hospital.   K4.1 today 4. [facility].   Follow-up with outpatient [facility] for left arm AV fistula patient tells me today she has scheduled OP apt.this month 5. Renal osteodystrophy -phosphorus 5.7 corrected calcium okay,  continue binders = Calcium acetate 6. Anemia ckd - hgb 9.5 ,(last rx to 25 Mircera  04/07/20) give 200 Aranesp today with HD  7. H/o SVT - during last admission 02/2020 initially on amio but then plan was for 30 day even monitor and continue BB. 8. HTN - BP okay cont home meds metoprolol and norvasc. At dry wt.  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  Okay for discharge per nephrology today,  arrangements were made for outpatient dialysis tomorrow at her center and then continue on Friday  DErnest Haber PA-C [phone]/11/2020,10:04 AM  LOS: 3 days   Labs: Basic Metabolic Panel: Recent Labs  Lab 04/21/20 1852 04/22/20 0517 04/23/20 0318  NA 133* 136 137  K 3.6 4.1 4.1  CL 92* 96* 98  CO2 '28 24 24  '$ GLUCOSE 129* 208* 153*  BUN 33* 36* 43*  CREATININE 6.98* 7.45* 10.24*  CALCIUM 7.1* 6.9* 6.7*  PHOS  --   --  5.7*   Liver Function Tests: Recent Labs  Lab 04/20/20 1748 04/23/20 0318  AST 14*  --   ALT <5  --   ALKPHOS 57  --   BILITOT 0.9  --   PROT 9.1*  --   ALBUMIN 3.6 2.5*   Recent Labs  Lab 04/20/20 1748  LIPASE 53*   No results for input(s): AMMONIA in the last 168 hours. CBC: Recent Labs  Lab 04/20/20 1748 04/20/20 1951 04/20/20 2243 04/23/20 0318  WBC 11.9*  --  11.4* 6.3  HGB 12.1 14.3 12.5 9.5*  HCT 37.9 42.0 40.1 31.4*  MCV 93.1  --  93.9 97.8  PLT 283  --  283 290   Cardiac  Enzymes: No results for input(s): CKTOTAL, CKMB, CKMBINDEX, TROPONINI in the last 168 hours. CBG: Recent Labs  Lab 04/22/20 1636 04/22/20 1904 04/22/20 2131 04/23/20 0104 04/23/20 0641  GLUCAP 167* 125* 76 111* 184*    Studies/Results: No results found. Medications:  . famotidine  10 mg Oral Daily  . heparin  4,000 Units Dialysis Once in dialysis  . heparin  5,000 Units Subcutaneous Q8H  . insulin aspart  0-5 Units Subcutaneous QHS  . insulin aspart  0-9 Units Subcutaneous TID WC  . insulin aspart  3 Units Subcutaneous TID WC  . insulin glargine  20 Units Subcutaneous Daily  . metoprolol tartrate  5 mg Intravenous Once

## 2020-04-23 NOTE — Progress Notes (Signed)
Pt had no c/o nausea or vomiting last night , no c/o abd  pain pt slept.

## 2020-04-24 ENCOUNTER — Telehealth (HOSPITAL_COMMUNITY): Payer: Self-pay

## 2020-04-24 NOTE — Telephone Encounter (Signed)
Transition of care contact from inpatient facility  Date of discharge: 04/23/20 Date of contact: 04/24/20 Method: Phone Spoke to: Patient  Patient contacted to discuss transition of care from recent inpatient hospitalization. Patient was admitted to [facility] from 04/20/20  To 04/23/20... with discharge diagnosis of  [place]/dka   Medication changes were reviewed.  Patient will follow up with his/her outpatient HD unit on: 04/25/20 op hd day

## 2020-04-25 ENCOUNTER — Other Ambulatory Visit: Payer: Self-pay

## 2020-04-25 DIAGNOSIS — N186 End stage renal disease: Secondary | ICD-10-CM

## 2020-04-25 DIAGNOSIS — Z992 Dependence on renal dialysis: Secondary | ICD-10-CM

## 2020-04-25 NOTE — Telephone Encounter (Signed)
Attempted to contact the patient, there was no answer. Will try again later. [name] EMTP 

## 2020-04-29 ENCOUNTER — Encounter (HOSPITAL_COMMUNITY): Payer: Medicare Other

## 2020-04-29 ENCOUNTER — Ambulatory Visit (INDEPENDENT_AMBULATORY_CARE_PROVIDER_SITE_OTHER): Payer: Medicare Other | Admitting: Neurology

## 2020-04-29 ENCOUNTER — Encounter (HOSPITAL_COMMUNITY): Payer: Self-pay | Admitting: Cardiovascular Disease

## 2020-04-29 ENCOUNTER — Encounter: Payer: Self-pay | Admitting: Neurology

## 2020-04-29 VITALS — BP 127/73 | HR 76 | Ht 66.0 in | Wt 172.6 lb

## 2020-04-29 DIAGNOSIS — G40909 Epilepsy, unspecified, not intractable, without status epilepticus: Secondary | ICD-10-CM | POA: Diagnosis not present

## 2020-04-29 DIAGNOSIS — G63 Polyneuropathy in diseases classified elsewhere: Secondary | ICD-10-CM | POA: Diagnosis not present

## 2020-04-29 NOTE — Progress Notes (Signed)
Chief Complaint  Patient presents with  . New Patient (Initial Visit)    "I am here to get a reading on my heart because of my seizures" Room 16, friend [name] in room    [name] is a 35 year old female, accompanied by her friend [name], seen in request by primary care physician Dr. [name], for evaluation of seizure, initial evaluation was on April 29, 2020.  I reviewed and summarized the referring note.  Past medical history End-stage renal disease, on hemodialysis Monday Wednesday Friday History of failed kidney transplant in 2019 Pyelonephritis Anemia of end-stage renal disease Type 1 diabetes Gastroparesis Hypertension Hyperlipidemia Triple X syndrome  She had a witnessed seizure on February 12, 2020, her friend [name] witnessed the seizure, described that she developed slurred speech, confusion, lasting for few minutes, followed by generalized tonic-clonic seizure activity, postevent confusion, was seen by neuro hospitalist, completed his seizure [place] extending of severe hypertension, glucose of 445, no antilipid medication was given, EEG showed moderate diffuse encephalopathy, no epileptiform discharge   She also reported a seizure in 2014, also happened in the setting of hyperglycemia, kidney failure, she reported uncontrollable right arm jerking, then went into generalized tonic-clonic seizure,  I personally reviewed MRI of the brain without February 14, 2020 that was normal  REVIEW OF SYSTEMS: Full 14 system review of systems performed and notable only for mild All other review of systems were negative.  ALLERGIES: Allergies  Allergen Reactions  . Benadryl [Diphenhydramine-Zinc Acetate] Anaphylaxis    Stopped breathing in ICU  . Diphenhydramine Anaphylaxis  . Doxycycline Shortness Of Breath    Other reaction(s): Other (See Comments) Shortness of Breath  Shortness of Breath  Shortness of Breath   . Motrin [Ibuprofen]  Shortness Of Breath and Itching    Per pt  . Shellfish-Derived Products     hives  . Shellfish Allergy Hives  . Banana Other (See Comments), Itching and Nausea And Vomiting    Sick on the stomach Other reaction(s): Other (See Comments) Sick on the stomach Sick on the stomach  . Chlorhexidine Itching  . Ferrous Sulfate Itching  . Iron Dextran Other (See Comments) and Itching    Vein irritation REACTION: vein irritation REACTION: vein irritation REACTION: vein irritation REACTION: vein irritation    HOME MEDICATIONS: Current Outpatient Medications  Medication Sig Dispense Refill  . ACCU-CHEK SOFTCLIX LANCETS lancets Use to check blood sugar 4 times per day. 150 each 5  . acetaminophen (TYLENOL) 500 MG tablet Take 1,000 mg by mouth every 6 (six) hours as needed for headache (pain).     . calcium acetate (PHOSLO) 667 MG capsule Take 2 capsules (1,334 mg total) by mouth 3 (three) times daily with meals. 180 capsule 0  . Calcium Carbonate Antacid (CALCIUM CARBONATE, DOSED IN MG ELEMENTAL CALCIUM,) 1250 MG/5ML SUSP Take 5 mLs by mouth 3 (three) times daily.     . cetirizine (ZYRTEC) 10 MG tablet Take 10 mg by mouth daily as needed for allergies.    . D 1000 25 MCG (1000 UT) capsule Take 1,000 Units by mouth every other day.     . fluticasone (FLONASE) 50 MCG/ACT nasal spray Place 2 sprays into both nostrils daily as needed for allergies.    Marland Kitchen gabapentin (NEURONTIN) 400 MG capsule Take 400 mg by mouth 3 (three) times daily.    . hydrOXYzine (ATARAX/VISTARIL) 50 MG tablet Take 1 tablet (50 mg total) by mouth 2 (two) times daily as needed for itching. (  Patient taking differently: Take 50 mg by mouth 2 (two) times daily as needed for itching.) 30 tablet 0  . insulin aspart (NOVOLOG) 100 UNIT/ML FlexPen Inject 2-10 Units into the skin 3 (three) times daily with meals. as needed for blood sugar management (sliding scale)    . insulin degludec (TRESIBA) 100 UNIT/ML FlexTouch Pen Inject 20 Units  into the skin daily.    Marland Kitchen lidocaine-prilocaine (EMLA) cream Apply 1 application topically See admin instructions. Apply topically one hour prior to dialysis on Monday, Wednesday, Friday    . loperamide (IMODIUM) 1 MG/5ML solution Take 2 mg by mouth 2 (two) times daily as needed for diarrhea or loose stools.     . Methoxy PEG-Epoetin Beta (MIRCERA IJ) Mircera    . metoCLOPramide (REGLAN) 10 MG tablet Take 1 tablet (10 mg total) by mouth 3 (three) times daily. 30 tablet 0  . metoprolol tartrate (LOPRESSOR) 25 MG tablet Take 1 tablet (25 mg total) by mouth 2 (two) times daily. 60 tablet 3  . oxyCODONE-acetaminophen (PERCOCET/ROXICET) 5-325 MG tablet Take 1 tablet by mouth 2 (two) times daily as needed for moderate pain. 10 tablet 0  . pantoprazole (PROTONIX) 40 MG tablet Take 40 mg by mouth 2 (two) times daily.     . promethazine (PHENERGAN) 25 MG tablet Take 25 mg by mouth every 6 (six) hours as needed for nausea or vomiting.     . simvastatin (ZOCOR) 20 MG tablet Take 20 mg by mouth at bedtime.      No current facility-administered medications for this visit.    PAST MEDICAL HISTORY: Past Medical History:  Diagnosis Date  . Anemia   . Chronic kidney disease    kidney transplant 07  . Diabetes mellitus    Pt reports diagnosis in June 2011, Type 2  . GERD (gastroesophageal reflux disease)   . Hyperlipidemia   . Hypertension   . 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 Pyelonephritis 06/23/2014  . Seasonal allergies   . UTI (urinary tract infection) 01/09/2015  . 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   Qualifier: Diagnosis of  By: Carlena Sax  MD, Colletta Maryland      PAST SURGICAL HISTORY: Past Surgical History:  Procedure Laterality Date  . ARTERIOVENOUS GRAFT PLACEMENT Bilateral    "neither work" (10/24/2017)  . AV FISTULA PLACEMENT Left 10/26/2018   Procedure: CREATION OF  ARTERIOVENOUS FISTULA  LEFT ARM;  Surgeon: [name], MD;  Location: [facility];  Service: Vascular;  Laterality: Left;  . BASCILIC VEIN TRANSPOSITION Left 12/21/2018   Procedure: Left arm BASILIC VEIN TRANSPOSITION SECOND STAGE;  Surgeon: [name], MD;  Location: [facility];  Service: Vascular;  Laterality: Left;  . CHOLECYSTECTOMY N/A 06/30/2017   Procedure: LAPAROSCOPIC CHOLECYSTECTOMY WITH INTRAOPERATIVE CHOLANGIOGRAM;  Surgeon: Excell Seltzer, MD;  [facility];  Service: General;  Laterality: N/A;  . ESOPHAGOGASTRODUODENOSCOPY (EGD) WITH PROPOFOL N/A 07/04/2017   Procedure: ESOPHAGOGASTRODUODENOSCOPY (EGD) WITH PROPOFOL;  Surgeon: Clarene Essex, MD;  [facility];  Service: Endoscopy;  Laterality: N/A;  . INSERTION OF DIALYSIS CATHETER N/A 03/20/2018   Procedure: INSERTION OF TUNNELED DIALYSIS CATHETER - RIGHT INTERANL JUGULAR PLACEMENT;  Surgeon: Angelia Mould, MD;  Location: [facility];  Service: Vascular;  Laterality: N/A;  . IR FLUORO GUIDE CV LINE RIGHT  04/18/2020  . IR GUIDED DRAIN W CATHETER PLACEMENT  10/28/2018  .  KIDNEY TRANSPLANT  2007  . PARATHYROIDECTOMY  ?2012   "3/4 removed" (10/24/2017)  . RENAL BIOPSY Bilateral 2003  . UPPER EXTREMITY VENOGRAPHY Bilateral 10/19/2018   Procedure: UPPER EXTREMITY VENOGRAPHY;  Surgeon: [name], MD;  Location: [facility];  Service: Cardiovascular;  Laterality: Bilateral;  Bilateral     FAMILY HISTORY: Family History  Problem Relation Age of Onset  . Arthritis Mother   . Hypertension Mother   . Aneurysm Mother        died of brain aneurysm  . CAD Father        Has 3 stents  . Diabetes Father        borderline  . Early death Brother        Died in war    SOCIAL HISTORY: Social History   Socioeconomic History  . Marital status: Single    Spouse name: Not on file  . Number of children: Not on file  . Years of education: Not on file  . Highest education level: Not on file   Occupational History  . 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 Smoking status: Never Smoker  . Smokeless tobacco: Never Used  Vaping Use  . Vaping Use: Never used  Substance and Sexual Activity  . Alcohol use: No  . Drug use: No  . Sexual activity: Yes    Birth control/protection: None  Other Topics Concern  . Not on file  Social History Narrative   Patient reports that she is single,    Right Handed   Drinks caffeine rarely   Social Determinants of Health   Financial Resource Strain: Not on file  Food Insecurity: Not on file  Transportation Needs: Not on file  Physical Activity: Not on file  Stress: Not on file  Social Connections: Not on file  Intimate Partner Violence: Not on file     PHYSICAL EXAM   Vitals:   04/29/20 1526  BP: 127/73  Pulse: 76  Weight: 172 lb 9.6 oz (78.3 kg)  Height: '5\' 6"'$  (1.676 m)   Not recorded     Body mass index is 27.86 kg/m.  PHYSICAL EXAMNIATION:  Gen: NAD, conversant, well nourised, well groomed                     Cardiovascular: Regular rate rhythm, no peripheral edema, warm, nontender. Eyes: Conjunctivae clear without exudates or hemorrhage Neck: Supple, no carotid bruits. Pulmonary: Clear to auscultation bilaterally   NEUROLOGICAL EXAM:  MENTAL STATUS: Speech:    Speech is normal; fluent and spontaneous with normal comprehension.  Cognition:     Orientation to time, place and person     Normal recent and remote memory     Normal Attention span and concentration     Normal Language, naming, repeating,spontaneous speech     Fund of knowledge   CRANIAL NERVES: CN II: Visual fields are full to confrontation. Pupils are round equal and briskly reactive to light. CN III, IV, VI: extraocular movement are normal. No ptosis. CN V: Facial sensation is intact to light touch CN VII: Face is symmetric with normal eye closure  CN VIII: Hearing is normal to causal  conversation. CN IX, X: Phonation is normal. CN XI: Head turning and shoulder shrug are intact  MOTOR: There is no pronator drift of out-stretched arms. Muscle bulk and tone are normal. Muscle strength is normal.  REFLEXES: Reflexes  are 2+ and symmetric at the biceps, triceps, knees, and absent at ankles. Plantar responses are flexor.  SENSORY: Length dependent decreased to light touch, vibratory sensation pinprick to ankle level COORDINATION: There is no trunk or limb dysmetria noted.  GAIT/STANCE: Need push-up to get up from seated position, mildly antalgic   DIAGNOSTIC DATA (LABS, IMAGING, TESTING) - I reviewed patient records, labs, notes, testing and imaging myself where available.   ASSESSMENT AND PLAN  [name] is a 35 y.o. female   Triple X syndrome Type I diabetes, diabetic peripheral neuropathy End-stage renal disease, on hemodialysis Seizure on February 12, 2020  This happened in the setting of hyperglycemia, glucose up to 445  Normal MRI of brain  EEG showed mild to moderate slowing  She was not treated with anti-epileptic medications, no recurrent seizures,  The event most likely related to her hyperglycemia, also reported history of seizure in 2014  Call office for recurrent seizure, repeat EEG, if EEG shows significant abnormality may consider antiepileptic medications, if not, will continue to observe  Return to clinic in 6 months, no driving until seizure-free for 6 months   Marcial Pacas, M.D. Ph.D.  [facility] [address], [place], [place] [address] Ph: [phone] Fax: [phone]  CC:  Lorenza [name], MD [address] [place],  [place] [address]

## 2020-05-01 ENCOUNTER — Encounter: Payer: Self-pay | Admitting: Neurology

## 2020-05-09 ENCOUNTER — Telehealth: Payer: Self-pay

## 2020-05-09 NOTE — Telephone Encounter (Signed)
NOTES ON [place], SENT REFERRAL TO SCHEDULING

## 2020-05-13 ENCOUNTER — Ambulatory Visit (HOSPITAL_COMMUNITY)
Admission: RE | Admit: 2020-05-13 | Discharge: 2020-05-13 | Disposition: A | Discharge status: Home / Self Care | Payer: Medicare Other | Source: Ambulatory Visit | Attending: Vascular Surgery | Admitting: Vascular Surgery

## 2020-05-13 ENCOUNTER — Ambulatory Visit (INDEPENDENT_AMBULATORY_CARE_PROVIDER_SITE_OTHER): Payer: Medicare Other | Admitting: Vascular Surgery

## 2020-05-13 ENCOUNTER — Ambulatory Visit (INDEPENDENT_AMBULATORY_CARE_PROVIDER_SITE_OTHER)
Admission: RE | Admit: 2020-05-13 | Discharge: 2020-05-13 | Disposition: A | Discharge status: Home / Self Care | Payer: Medicare Other | Source: Ambulatory Visit | Attending: Vascular Surgery | Admitting: Vascular Surgery

## 2020-05-13 ENCOUNTER — Encounter: Payer: Self-pay | Admitting: Vascular Surgery

## 2020-05-13 ENCOUNTER — Other Ambulatory Visit: Payer: Self-pay

## 2020-05-13 ENCOUNTER — Telehealth (HOSPITAL_COMMUNITY): Payer: Self-pay | Admitting: Cardiovascular Disease

## 2020-05-13 VITALS — BP 128/89 | HR 93 | Temp 97.6°F | Resp 14 | Ht 66.0 in | Wt 175.0 lb

## 2020-05-13 DIAGNOSIS — N186 End stage renal disease: Secondary | ICD-10-CM | POA: Diagnosis present

## 2020-05-13 DIAGNOSIS — Z992 Dependence on renal dialysis: Secondary | ICD-10-CM

## 2020-05-13 NOTE — Telephone Encounter (Signed)
Just an FYI. We have made several attempts to contact this patient including sending a letter to schedule or reschedule their Myoview. We will be removing the patient from the echo/NUC WQ.    04/29/20 NO SHOWED -MAILED LETTER/LBW   Thank you

## 2020-05-13 NOTE — Telephone Encounter (Signed)
Just an FYI. We have made several attempts to contact this patient including sending a letter to schedule or reschedule their Myoview. We will be removing the patient from the echo/nuc WQ.   04/29/20 NO SHOWED -MAILED LETTER/LBW     Thank you

## 2020-05-13 NOTE — Addendum Note (Signed)
Addended by: Stanton Kidney on: 05/13/2020 10:22 AM   Modules accepted: Orders

## 2020-05-13 NOTE — Progress Notes (Signed)
Patient name: [name] MRN: [id number] DOB: June 14, 1985 Sex: female  REASON FOR CONSULT: Evaluate for new permanent AVF access  HPI: [name] is a 35 y.o. 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  She previously refused groin access.  I performed a left basilic vein fistula in [id number] that worked until Christmas of this past year in 2021 when she states it stopped working.  She currently has a femoral catheter placed by [facility].  She is right-handed.  She would still like to avoid groin access.  Past Medical History:  Diagnosis Date  . Anemia   . Chronic kidney disease    kidney transplant 07  . Diabetes mellitus    Pt reports diagnosis in June 2011, Type 2  . GERD (gastroesophageal reflux disease)   . Hyperlipidemia   . Hypertension   . 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 Pyelonephritis 06/23/2014  . Seasonal allergies   . UTI (urinary tract infection) 01/09/2015  . 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 ARTERIOVENOUS GRAFT PLACEMENT Bilateral    "neither work" (10/24/2017)  . AV FISTULA PLACEMENT Left 10/26/2018   Procedure: CREATION OF ARTERIOVENOUS FISTULA  LEFT ARM;  Surgeon: [name], MD;  Location: [facility];  Service: Vascular;  Laterality: Left;  . BASCILIC VEIN TRANSPOSITION Left 12/21/2018   Procedure: Left arm BASILIC VEIN TRANSPOSITION SECOND STAGE;  Surgeon: [name], MD;  Location: [facility];  Service: Vascular;  Laterality: Left;  . CHOLECYSTECTOMY N/A 06/30/2017    Procedure: LAPAROSCOPIC CHOLECYSTECTOMY WITH INTRAOPERATIVE CHOLANGIOGRAM;  Surgeon: Excell Seltzer, MD;  [facility];  Service: General;  Laterality: N/A;  . ESOPHAGOGASTRODUODENOSCOPY (EGD) WITH PROPOFOL N/A 07/04/2017   Procedure: ESOPHAGOGASTRODUODENOSCOPY (EGD) WITH PROPOFOL;  Surgeon: Clarene Essex, MD;  [facility];  Service: Endoscopy;  Laterality: N/A;  . INSERTION OF DIALYSIS CATHETER N/A 03/20/2018   Procedure: INSERTION OF TUNNELED DIALYSIS CATHETER - RIGHT INTERANL JUGULAR PLACEMENT;  Surgeon: Angelia Mould, MD;  Location: [facility];  Service: Vascular;  Laterality: N/A;  . IR FLUORO GUIDE CV LINE RIGHT  04/18/2020  . IR GUIDED DRAIN W CATHETER PLACEMENT  10/28/2018  . KIDNEY TRANSPLANT  2007  . PARATHYROIDECTOMY  ?2012   "3/4 removed" (10/24/2017)  . RENAL BIOPSY Bilateral 2003  . UPPER EXTREMITY VENOGRAPHY Bilateral 10/19/2018   Procedure: UPPER EXTREMITY VENOGRAPHY;  Surgeon: [name], MD;  Location: [facility];  Service: Cardiovascular;  Laterality: Bilateral;  Bilateral     Family History  Problem Relation Age of Onset  . Arthritis Mother   . Hypertension Mother   . Aneurysm Mother        died of brain aneurysm  . CAD Father        Has 3 stents  . Diabetes Father        borderline  . Early death Brother        Died in war    SOCIAL HISTORY: Social History  Socioeconomic History  . Marital status: Single    Spouse name: Not on file  . Number of children: Not on file  . Years of education: Not on file  . Highest education level: Not on file  Occupational History  . 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 Smoking status: Never Smoker  . Smokeless tobacco: Never Used  Vaping Use  . Vaping Use: Never used  Substance and Sexual Activity  . Alcohol use: No  . Drug use: No  . Sexual activity: Yes    Birth control/protection: None  Other Topics Concern  . Not on file   Social History Narrative   Patient reports that she is single,    Right Handed   Drinks caffeine rarely   Social Determinants of Health   Financial Resource Strain: Not on file  Food Insecurity: Not on file  Transportation Needs: Not on file  Physical Activity: Not on file  Stress: Not on file  Social Connections: Not on file  Intimate Partner Violence: Not on file    Allergies  Allergen Reactions  . Benadryl [Diphenhydramine-Zinc Acetate] Anaphylaxis    Stopped breathing in ICU  . Diphenhydramine Anaphylaxis  . Doxycycline Shortness Of Breath    Other reaction(s): Other (See Comments) Shortness of Breath  Shortness of Breath  Shortness of Breath   . Motrin [Ibuprofen] Shortness Of Breath and Itching    Per pt  . Shellfish-Derived Products     hives  . Shellfish Allergy Hives  . Banana Other (See Comments), Itching and Nausea And Vomiting    Sick on the stomach Other reaction(s): Other (See Comments) Sick on the stomach Sick on the stomach  . Chlorhexidine Itching  . Ferrous Sulfate Itching  . Iron Dextran Other (See Comments) and Itching    Vein irritation REACTION: vein irritation REACTION: vein irritation REACTION: vein irritation REACTION: vein irritation    Current Outpatient Medications  Medication Sig Dispense Refill  . ACCU-CHEK SOFTCLIX LANCETS lancets Use to check blood sugar 4 times per day. 150 each 5  . acetaminophen (TYLENOL) 500 MG tablet Take 1,000 mg by mouth every 6 (six) hours as needed for headache (pain).     . calcium acetate (PHOSLO) 667 MG capsule Take 2 capsules (1,334 mg total) by mouth 3 (three) times daily with meals. 180 capsule 0  . Calcium Carbonate Antacid (CALCIUM CARBONATE, DOSED IN MG ELEMENTAL CALCIUM,) 1250 MG/5ML SUSP Take 5 mLs by mouth 3 (three) times daily.     . cetirizine (ZYRTEC) 10 MG tablet Take 10 mg by mouth daily as needed for allergies.    . D 1000 25 MCG (1000 UT) capsule Take 1,000 Units by mouth every other  day.     . fluticasone (FLONASE) 50 MCG/ACT nasal spray Place 2 sprays into both nostrils daily as needed for allergies.    Marland Kitchen gabapentin (NEURONTIN) 400 MG capsule Take 400 mg by mouth 3 (three) times daily.    . hydrOXYzine (ATARAX/VISTARIL) 50 MG tablet Take 1 tablet (50 mg total) by mouth 2 (two) times daily as needed for itching. (Patient taking differently: Take 50 mg by mouth 2 (two) times daily as needed for itching.) 30 tablet 0  . insulin aspart (NOVOLOG) 100 UNIT/ML FlexPen Inject 2-10 Units into the skin 3 (three) times daily with meals. as needed for blood sugar management (sliding scale)    . insulin degludec (TRESIBA) 100 UNIT/ML FlexTouch Pen  Inject 20 Units into the skin daily.    Marland Kitchen lidocaine-prilocaine (EMLA) cream Apply 1 application topically See admin instructions. Apply topically one hour prior to dialysis on Monday, Wednesday, Friday    . loperamide (IMODIUM) 1 MG/5ML solution Take 2 mg by mouth 2 (two) times daily as needed for diarrhea or loose stools.     . Methoxy PEG-Epoetin Beta (MIRCERA IJ) Mircera    . metoCLOPramide (REGLAN) 10 MG tablet Take 1 tablet (10 mg total) by mouth 3 (three) times daily. 30 tablet 0  . metoprolol tartrate (LOPRESSOR) 25 MG tablet Take 1 tablet (25 mg total) by mouth 2 (two) times daily. 60 tablet 3  . oxyCODONE-acetaminophen (PERCOCET/ROXICET) 5-325 MG tablet Take 1 tablet by mouth 2 (two) times daily as needed for moderate pain. 10 tablet 0  . pantoprazole (PROTONIX) 40 MG tablet Take 40 mg by mouth 2 (two) times daily.     . promethazine (PHENERGAN) 25 MG tablet Take 25 mg by mouth every 6 (six) hours as needed for nausea or vomiting.     . simvastatin (ZOCOR) 20 MG tablet Take 20 mg by mouth at bedtime.      No current facility-administered medications for this visit.    REVIEW OF SYSTEMS:  '[X]'$  denotes positive finding, '[ ]'$  denotes negative finding Cardiac  Comments:  Chest pain or chest pressure:    Shortness of breath upon exertion:     Short of breath when lying flat:    Irregular heart rhythm:        Vascular    Pain in calf, thigh, or hip brought on by ambulation:    Pain in feet at night that wakes you up from your sleep:     Blood clot in your veins:    Leg swelling:         Pulmonary    Oxygen at home:    Productive cough:     Wheezing:         Neurologic    Sudden weakness in arms or legs:     Sudden numbness in arms or legs:     Sudden onset of difficulty speaking or slurred speech:    Temporary loss of vision in one eye:     Problems with dizziness:         Gastrointestinal    Blood in stool:     Vomited blood:         Genitourinary    Burning when urinating:     Blood in urine:        Psychiatric    Major depression:         Hematologic    Bleeding problems:    Problems with blood clotting too easily:        Skin    Rashes or ulcers:        Constitutional    Fever or chills:      PHYSICAL EXAM: Vitals:   05/13/20 0907  BP: 128/89  Pulse: 93  Resp: 14  Temp: 97.6 F (36.4 C)  TempSrc: Temporal  SpO2: 96%  Weight: 175 lb (79.4 kg)  Height: '5\' 6"'$  (1.676 m)    GENERAL: The patient is a well-nourished female, in no acute distress. The vital signs are documented above. CARDIAC: There is a regular rate and rhythm.  VASCULAR:  Bilateral upper extremity AV grafts that have no thrill and are occluded Palpable brachial and radial pulse in bilateral upper extremities Right femoral groin catheter for dialyssi  PULMONARY: No respiratory distress. ABDOMEN: Soft and non-tender. MUSCULOSKELETAL: There are no major deformities or cyanosis. NEUROLOGIC: No focal weakness or paresthesias are detected.    DATA:   Vein mapping today shows a borderline usable left cephalic vein.  Assessment/Plan:  35 year old female with end-stage renal disease and failed kidney transplant presents for evaluation of new dialysis access.  She has failed bilateral upper extremity AV grafts that have had  multiple previous revisions.  Her most recent left basilic vein fistula is now thrombosed.  I did a venogram in 2020 that showed a central stenosis on the right, so I think her only upper extremity option would be the left arm.  Vein mapping today shows a borderline usable cephalic vein although when I looked at this with [employer] in the office today it looks fairly small to me.  Discussed the other option would be trying to place a new AV graft in the left arm that would require jumping above her previous grafts.  Discussed given she has had many failed access attempts in the past there would be some risk that this does not work and next option would be groin access.  She is not interested in groin access at this time.  Risk and benefits again discussed.  [name], MD [facility] Office: [place]

## 2020-05-20 ENCOUNTER — Telehealth (HOSPITAL_COMMUNITY): Payer: Self-pay

## 2020-05-20 NOTE — Telephone Encounter (Signed)
Detailed instructions left on the patient's answering machine. Asked to call back with any questions. S.Williams EMTP 

## 2020-05-21 ENCOUNTER — Other Ambulatory Visit: Payer: Medicare Other

## 2020-05-22 ENCOUNTER — Other Ambulatory Visit: Payer: Self-pay

## 2020-05-22 ENCOUNTER — Encounter (HOSPITAL_COMMUNITY): Payer: Self-pay | Admitting: Vascular Surgery

## 2020-05-22 ENCOUNTER — Other Ambulatory Visit (HOSPITAL_COMMUNITY): Payer: Self-pay | Admitting: Cardiovascular Disease

## 2020-05-22 ENCOUNTER — Other Ambulatory Visit (HOSPITAL_COMMUNITY): Payer: Medicare Other

## 2020-05-22 ENCOUNTER — Ambulatory Visit (HOSPITAL_COMMUNITY): Payer: Medicare Other | Attending: Cardiology

## 2020-05-22 VITALS — Ht 66.0 in | Wt 181.0 lb

## 2020-05-22 DIAGNOSIS — Z01818 Encounter for other preprocedural examination: Secondary | ICD-10-CM | POA: Insufficient documentation

## 2020-05-22 DIAGNOSIS — Z992 Dependence on renal dialysis: Secondary | ICD-10-CM

## 2020-05-22 DIAGNOSIS — N186 End stage renal disease: Secondary | ICD-10-CM

## 2020-05-22 DIAGNOSIS — R079 Chest pain, unspecified: Secondary | ICD-10-CM | POA: Diagnosis present

## 2020-05-22 LAB — MYOCARDIAL PERFUSION IMAGING
LV dias vol: 63 mL (ref 46–106)
LV sys vol: 25 mL
Peak HR: 103 {beats}/min
Rest HR: 88 {beats}/min
SDS: 6
SRS: 0
SSS: 6
TID: 1.23

## 2020-05-22 MED ORDER — TECHNETIUM TC 99M TETROFOSMIN IV KIT
32.6000 | PACK | Freq: Once | INTRAVENOUS | Status: AC | PRN
  Administered 2020-05-22: 32.6 via INTRAVENOUS
  Filled 2020-05-22: qty 33

## 2020-05-22 MED ORDER — TECHNETIUM TC 99M TETROFOSMIN IV KIT
10.3000 | PACK | Freq: Once | INTRAVENOUS | Status: AC | PRN
  Administered 2020-05-22: 10.3 via INTRAVENOUS
  Filled 2020-05-22: qty 11

## 2020-05-22 MED ORDER — REGADENOSON 0.4 MG/5ML IV SOLN
0.4000 mg | Freq: Once | INTRAVENOUS | Status: AC
  Administered 2020-05-22: 0.4 mg via INTRAVENOUS

## 2020-05-22 NOTE — Anesthesia Preprocedure Evaluation (Addendum)
Anesthesia Evaluation  Patient identified by MRN, date of birth, ID band Patient awake    Reviewed: Allergy & Precautions, NPO status , Patient's Chart, lab work & pertinent test results, reviewed documented beta blocker date and time   History of Anesthesia Complications Negative for: history of anesthetic complications  Airway Mallampati: II  TM Distance: >3 FB Neck ROM: Full    Dental  (+) Chipped,    Pulmonary neg pulmonary ROS,    Pulmonary exam normal        Cardiovascular hypertension, Pt. on medications and Pt. on home beta blockers Normal cardiovascular exam     Neuro/Psych pseudoseizures   GI/Hepatic Neg liver ROS, hiatal hernia, GERD  Medicated and Controlled,  Endo/Other  diabetes, Type 1, Insulin Dependent  Renal/GU Dialysis and ESRFRenal disease  negative genitourinary   Musculoskeletal negative musculoskeletal ROS (+)   Abdominal   Peds  Hematology  (+) Blood dyscrasia, anemia ,   Anesthesia Other Findings Day of surgery medications reviewed with patient.  Reproductive/Obstetrics negative OB ROS                            Anesthesia Physical Anesthesia Plan  ASA: III  Anesthesia Plan: MAC   Post-op Pain Management:    Induction:   PONV Risk Score and Plan: 2 and Treatment may vary due to age or medical condition, Propofol infusion, Ondansetron and Midazolam  Airway Management Planned: Natural Airway and Simple Face Mask  Additional Equipment: None  Intra-op Plan:   Post-operative Plan:   Informed Consent: I have reviewed the patients History and Physical, chart, labs and discussed the procedure including the risks, benefits and alternatives for the proposed anesthesia with the patient or authorized representative who has indicated his/her understanding and acceptance.       Plan Discussed with: CRNA  Anesthesia Plan Comments:        Anesthesia Quick  Evaluation

## 2020-05-23 ENCOUNTER — Encounter (HOSPITAL_COMMUNITY): Payer: Self-pay | Admitting: Vascular Surgery

## 2020-05-23 ENCOUNTER — Encounter (HOSPITAL_COMMUNITY)
Admission: RE | Disposition: A | Discharge status: Home / Self Care | Payer: Self-pay | Source: Home / Self Care | Attending: Vascular Surgery

## 2020-05-23 ENCOUNTER — Ambulatory Visit (HOSPITAL_COMMUNITY)
Admission: RE | Admit: 2020-05-23 | Discharge: 2020-05-23 | Disposition: A | Discharge status: Home / Self Care | Payer: Medicare Other | Attending: Vascular Surgery | Admitting: Vascular Surgery

## 2020-05-23 ENCOUNTER — Telehealth: Payer: Self-pay

## 2020-05-23 ENCOUNTER — Ambulatory Visit (HOSPITAL_COMMUNITY): Payer: Medicare Other | Admitting: Certified Registered"

## 2020-05-23 ENCOUNTER — Other Ambulatory Visit: Payer: Self-pay

## 2020-05-23 DIAGNOSIS — Z881 Allergy status to other antibiotic agents status: Secondary | ICD-10-CM | POA: Insufficient documentation

## 2020-05-23 DIAGNOSIS — N186 End stage renal disease: Secondary | ICD-10-CM | POA: Diagnosis present

## 2020-05-23 DIAGNOSIS — Z20822 Contact with and (suspected) exposure to covid-19: Secondary | ICD-10-CM | POA: Insufficient documentation

## 2020-05-23 DIAGNOSIS — Z79899 Other long term (current) drug therapy: Secondary | ICD-10-CM | POA: Insufficient documentation

## 2020-05-23 DIAGNOSIS — Z794 Long term (current) use of insulin: Secondary | ICD-10-CM | POA: Insufficient documentation

## 2020-05-23 DIAGNOSIS — N185 Chronic kidney disease, stage 5: Secondary | ICD-10-CM

## 2020-05-23 DIAGNOSIS — E1122 Type 2 diabetes mellitus with diabetic chronic kidney disease: Secondary | ICD-10-CM | POA: Insufficient documentation

## 2020-05-23 DIAGNOSIS — Z992 Dependence on renal dialysis: Secondary | ICD-10-CM | POA: Insufficient documentation

## 2020-05-23 DIAGNOSIS — Z886 Allergy status to analgesic agent status: Secondary | ICD-10-CM | POA: Diagnosis not present

## 2020-05-23 DIAGNOSIS — Z888 Allergy status to other drugs, medicaments and biological substances status: Secondary | ICD-10-CM | POA: Diagnosis not present

## 2020-05-23 DIAGNOSIS — I120 Hypertensive chronic kidney disease with stage 5 chronic kidney disease or end stage renal disease: Secondary | ICD-10-CM | POA: Insufficient documentation

## 2020-05-23 HISTORY — PX: AV FISTULA PLACEMENT: SHX1204

## 2020-05-23 LAB — POCT I-STAT, CHEM 8
BUN: 35 mg/dL — ABNORMAL HIGH (ref 6–20)
Calcium, Ion: 0.86 mmol/L — CL (ref 1.15–1.40)
Chloride: 107 mmol/L (ref 98–111)
Creatinine, Ser: 8.8 mg/dL — ABNORMAL HIGH (ref 0.44–1.00)
Glucose, Bld: 295 mg/dL — ABNORMAL HIGH (ref 70–99)
HCT: 35 % — ABNORMAL LOW (ref 36.0–46.0)
Hemoglobin: 11.9 g/dL — ABNORMAL LOW (ref 12.0–15.0)
Potassium: 4.9 mmol/L (ref 3.5–5.1)
Sodium: 140 mmol/L (ref 135–145)
TCO2: 23 mmol/L (ref 22–32)

## 2020-05-23 LAB — GLUCOSE, CAPILLARY
Glucose-Capillary: 271 mg/dL — ABNORMAL HIGH (ref 70–99)
Glucose-Capillary: 135 mg/dL — ABNORMAL HIGH (ref 70–99)

## 2020-05-23 LAB — SARS CORONAVIRUS 2 BY RT PCR (HOSPITAL ORDER, PERFORMED IN ~~LOC~~ HOSPITAL LAB): SARS Coronavirus 2: NEGATIVE

## 2020-05-23 LAB — HCG, SERUM, QUALITATIVE: Preg, Serum: NEGATIVE

## 2020-05-23 SURGERY — ARTERIOVENOUS (AV) FISTULA CREATION
Anesthesia: Monitor Anesthesia Care | Site: Arm Upper | Laterality: Left

## 2020-05-23 MED ORDER — OXYCODONE HCL 5 MG/5ML PO SOLN
5.0000 mg | Freq: Once | ORAL | Status: DC | PRN
Start: 2020-05-23 — End: 2020-05-23

## 2020-05-23 MED ORDER — OXYCODONE HCL 5 MG PO TABS: 5.0000 mg | ORAL_TABLET | Freq: Once | ORAL | Status: DC | PRN

## 2020-05-23 MED ORDER — LIDOCAINE HCL (PF) 1 % IJ SOLN
INTRAMUSCULAR | Status: AC
  Filled 2020-05-23: qty 30

## 2020-05-23 MED ORDER — ACETAMINOPHEN 500 MG PO TABS
1000.0000 mg | ORAL_TABLET | Freq: Once | ORAL | Status: AC
  Administered 2020-05-23: 1000 mg via ORAL
  Filled 2020-05-23: qty 2

## 2020-05-23 MED ORDER — SODIUM CHLORIDE 0.9 % IV SOLN: INTRAVENOUS | Status: DC

## 2020-05-23 MED ORDER — OXYCODONE-ACETAMINOPHEN 5-325 MG PO TABS: 1.0000 | ORAL_TABLET | ORAL | 0 refills | Status: DC | PRN

## 2020-05-23 MED ORDER — PROMETHAZINE HCL 25 MG/ML IJ SOLN: 6.2500 mg | INTRAMUSCULAR | Status: DC | PRN

## 2020-05-23 MED ORDER — MIDAZOLAM HCL 5 MG/5ML IJ SOLN
INTRAMUSCULAR | Status: DC | PRN
  Administered 2020-05-23: 2 mg via INTRAVENOUS

## 2020-05-23 MED ORDER — PROPOFOL 10 MG/ML IV BOLUS
INTRAVENOUS | Status: AC
  Filled 2020-05-23: qty 20

## 2020-05-23 MED ORDER — 0.9 % SODIUM CHLORIDE (POUR BTL) OPTIME
TOPICAL | Status: DC | PRN
  Administered 2020-05-23: 1000 mL

## 2020-05-23 MED ORDER — PHENYLEPHRINE HCL-NACL 10-0.9 MG/250ML-% IV SOLN
INTRAVENOUS | Status: DC | PRN
  Administered 2020-05-23: 25 ug/min via INTRAVENOUS

## 2020-05-23 MED ORDER — FENTANYL CITRATE (PF) 250 MCG/5ML IJ SOLN
INTRAMUSCULAR | Status: AC
  Filled 2020-05-23: qty 5

## 2020-05-23 MED ORDER — MIDAZOLAM HCL 2 MG/2ML IJ SOLN
INTRAMUSCULAR | Status: AC
  Filled 2020-05-23: qty 2

## 2020-05-23 MED ORDER — LIDOCAINE HCL (CARDIAC) PF 100 MG/5ML IV SOSY
PREFILLED_SYRINGE | INTRAVENOUS | Status: DC | PRN
  Administered 2020-05-23: 50 mg via INTRAVENOUS

## 2020-05-23 MED ORDER — LIDOCAINE HCL 1 % IJ SOLN
INTRAMUSCULAR | Status: DC | PRN
  Administered 2020-05-23: 15 mL via INTRADERMAL

## 2020-05-23 MED ORDER — SODIUM CHLORIDE 0.9 % IV SOLN
INTRAVENOUS | Status: AC
  Filled 2020-05-23: qty 1.2

## 2020-05-23 MED ORDER — FENTANYL CITRATE (PF) 100 MCG/2ML IJ SOLN: 25.0000 ug | INTRAMUSCULAR | Status: DC | PRN

## 2020-05-23 MED ORDER — INSULIN ASPART 100 UNIT/ML ~~LOC~~ SOLN
8.0000 [IU] | Freq: Once | SUBCUTANEOUS | Status: AC
  Administered 2020-05-23: 8 [IU] via SUBCUTANEOUS
  Filled 2020-05-23: qty 1

## 2020-05-23 MED ORDER — METOPROLOL TARTRATE 12.5 MG HALF TABLET
25.0000 mg | ORAL_TABLET | Freq: Once | ORAL | Status: AC
  Administered 2020-05-23: 25 mg via ORAL
  Filled 2020-05-23: qty 2

## 2020-05-23 MED ORDER — PROPOFOL 500 MG/50ML IV EMUL
INTRAVENOUS | Status: DC | PRN
  Administered 2020-05-23: 75 ug/kg/min via INTRAVENOUS

## 2020-05-23 MED ORDER — CEFAZOLIN SODIUM-DEXTROSE 2-4 GM/100ML-% IV SOLN
2.0000 g | INTRAVENOUS | Status: AC
  Administered 2020-05-23: 2 g via INTRAVENOUS
  Filled 2020-05-23: qty 100

## 2020-05-23 MED ORDER — SODIUM CHLORIDE 0.9 % IV SOLN: INTRAVENOUS | Status: DC | PRN

## 2020-05-23 MED ORDER — HEPARIN SODIUM (PORCINE) 1000 UNIT/ML IJ SOLN
INTRAMUSCULAR | Status: DC | PRN
  Administered 2020-05-23: 3000 [IU] via INTRAVENOUS

## 2020-05-23 MED ORDER — LIDOCAINE-EPINEPHRINE (PF) 1 %-1:200000 IJ SOLN
INTRAMUSCULAR | Status: AC
  Filled 2020-05-23: qty 30

## 2020-05-23 MED ORDER — SODIUM CHLORIDE 0.9 % IV SOLN
INTRAVENOUS | Status: DC | PRN
  Administered 2020-05-23: 500 mL

## 2020-05-23 MED ORDER — CHLORHEXIDINE GLUCONATE 0.12 % MT SOLN
OROMUCOSAL | Status: AC
  Administered 2020-05-23: 15 mL
  Filled 2020-05-23: qty 15

## 2020-05-23 SURGICAL SUPPLY — 33 items
ARMBAND PINK RESTRICT EXTREMIT (MISCELLANEOUS) ×2 IMPLANT
BLADE CLIPPER SURG (BLADE) ×2 IMPLANT
CANISTER SUCT 3000ML PPV (MISCELLANEOUS) ×2 IMPLANT
CLIP VESOCCLUDE MED 6/CT (CLIP) ×2 IMPLANT
CLIP VESOCCLUDE SM WIDE 6/CT (CLIP) ×2 IMPLANT
COVER PROBE W GEL 5X96 (DRAPES) ×2 IMPLANT
COVER WAND RF STERILE (DRAPES) ×2 IMPLANT
DECANTER SPIKE VIAL GLASS SM (MISCELLANEOUS) ×2 IMPLANT
DERMABOND ADVANCED (GAUZE/BANDAGES/DRESSINGS) ×1
DERMABOND ADVANCED .7 DNX12 (GAUZE/BANDAGES/DRESSINGS) ×1 IMPLANT
ELECT REM PT RETURN 9FT ADLT (ELECTROSURGICAL) ×2
ELECTRODE REM PT RTRN 9FT ADLT (ELECTROSURGICAL) ×1 IMPLANT
GLOVE BIO SURGEON STRL SZ7.5 (GLOVE) ×2 IMPLANT
GLOVE SRG 8 PF TXTR STRL LF DI (GLOVE) ×1 IMPLANT
GLOVE SURG UNDER POLY LF SZ8 (GLOVE) ×2
GOWN STRL REUS W/ TWL LRG LVL3 (GOWN DISPOSABLE) ×2 IMPLANT
GOWN STRL REUS W/ TWL XL LVL3 (GOWN DISPOSABLE) ×2 IMPLANT
GOWN STRL REUS W/TWL LRG LVL3 (GOWN DISPOSABLE) ×4
GOWN STRL REUS W/TWL XL LVL3 (GOWN DISPOSABLE) ×4
HEMOSTAT SPONGE AVITENE ULTRA (HEMOSTASIS) IMPLANT
KIT BASIN OR (CUSTOM PROCEDURE TRAY) ×2 IMPLANT
KIT TURNOVER KIT B (KITS) ×2 IMPLANT
NS IRRIG 1000ML POUR BTL (IV SOLUTION) ×2 IMPLANT
PACK CV ACCESS (CUSTOM PROCEDURE TRAY) ×2 IMPLANT
PAD ARMBOARD 7.5X6 YLW CONV (MISCELLANEOUS) ×4 IMPLANT
SUT MNCRL AB 4-0 PS2 18 (SUTURE) ×2 IMPLANT
SUT PROLENE 6 0 BV (SUTURE) ×6 IMPLANT
SUT PROLENE 7 0 BV 1 (SUTURE) IMPLANT
SUT VIC AB 3-0 SH 27 (SUTURE) ×2
SUT VIC AB 3-0 SH 27X BRD (SUTURE) ×1 IMPLANT
TOWEL GREEN STERILE (TOWEL DISPOSABLE) ×2 IMPLANT
UNDERPAD 30X36 HEAVY ABSORB (UNDERPADS AND DIAPERS) ×2 IMPLANT
WATER STERILE IRR 1000ML POUR (IV SOLUTION) ×2 IMPLANT

## 2020-05-23 NOTE — Anesthesia Postprocedure Evaluation (Signed)
Anesthesia Post Note  Patient: [name]  Procedure(s) Performed: LEFT ARM ARTERIOVENOUS (AV) FISTULA CREATION (Left Arm Upper)     Patient location during evaluation: PACU Anesthesia Type: MAC Level of consciousness: awake and alert and oriented Pain management: pain level controlled Vital Signs Assessment: post-procedure vital signs reviewed and stable Respiratory status: spontaneous breathing, nonlabored ventilation and respiratory function stable Cardiovascular status: blood pressure returned to baseline Postop Assessment: no apparent nausea or vomiting Anesthetic complications: no   No complications documented.  Last Vitals:  Vitals:   05/23/20 1022 05/23/20 1037  BP: 102/78 96/73  Pulse: 81   Resp: 14 15  Temp:  36.7 C  SpO2: 99% 98%    Last Pain:  Vitals:   05/23/20 1037  TempSrc:   PainSc: 0-No pain                 Brennan Bailey

## 2020-05-23 NOTE — H&P (Signed)
History and Physical Interval Note:  05/23/2020 7:29 AM  [name]  has presented today for surgery, with the diagnosis of ESRD.  The various methods of treatment have been discussed with the patient and family. After consideration of risks, benefits and other options for treatment, the patient has consented to  Procedure(s): LEFT ARM ARTERIOVENOUS (AV) FISTULA CREATION VERSUS GRAFT PLACEMENT (Left) as a surgical intervention.  The patient's history has been reviewed, patient examined, no change in status, stable for surgery.  I have reviewed the patient's chart and labs.  Questions were answered to the patient's satisfaction.    Left arm AVF vs graft  [name]   Patient name: [name]           MRN: [id number]        DOB: 11-02-1985          Sex: female  REASON FOR CONSULT: Evaluate for new permanent AVF access  HPI: [name] is a 35 y.o. 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  She previously refused groin access.  I performed a left basilic vein fistula in [id number] that worked until Christmas of this past year in 2021 when she states it stopped working.  She currently has a femoral catheter placed by [facility].  She is right-handed.  She would still like to avoid groin access.      Past Medical History:  Diagnosis Date  . Anemia   . Chronic kidney disease    kidney transplant 07  . Diabetes mellitus    Pt reports diagnosis in June 2011, Type 2  . GERD (gastroesophageal reflux disease)   . Hyperlipidemia   . Hypertension   . 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 Pseudoseizures ([place]) 12/22/2012  .  Pyelonephritis 06/23/2014  . Seasonal allergies   . UTI (urinary tract infection) 01/09/2015  . 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 ARTERIOVENOUS GRAFT PLACEMENT Bilateral    "neither work" (10/24/2017)  . AV FISTULA PLACEMENT Left 10/26/2018   Procedure: CREATION OF ARTERIOVENOUS FISTULA  LEFT ARM;  Surgeon: [name], MD;  Location: [place];  Service: Vascular;  Laterality: Left;  . BASCILIC VEIN TRANSPOSITION Left 12/21/2018   Procedure: Left arm BASILIC VEIN TRANSPOSITION SECOND STAGE;  Surgeon: [name], MD;  Location: [facility];  Service: Vascular;  Laterality: Left;  . CHOLECYSTECTOMY N/A 06/30/2017   Procedure: LAPAROSCOPIC CHOLECYSTECTOMY WITH INTRAOPERATIVE CHOLANGIOGRAM;  Surgeon: Excell Seltzer, MD;  Location: [facility];  Service: General;  Laterality: N/A;  . ESOPHAGOGASTRODUODENOSCOPY (EGD) WITH PROPOFOL N/A 07/04/2017   Procedure: ESOPHAGOGASTRODUODENOSCOPY (EGD) WITH PROPOFOL;  Surgeon: Clarene Essex, MD;  Location: [facility];  Service: Endoscopy;  Laterality: N/A;  . INSERTION OF DIALYSIS CATHETER N/A 03/20/2018   Procedure: INSERTION OF TUNNELED DIALYSIS CATHETER - RIGHT INTERANL JUGULAR PLACEMENT;  Surgeon: Angelia Mould, MD;  Location: [facility];  Service: Vascular;  Laterality: N/A;  . IR FLUORO GUIDE CV LINE RIGHT  04/18/2020  .  IR GUIDED DRAIN W CATHETER PLACEMENT  10/28/2018  . KIDNEY TRANSPLANT  2007  . PARATHYROIDECTOMY  ?2012   "3/4 removed" (10/24/2017)  . RENAL BIOPSY Bilateral 2003  . UPPER EXTREMITY VENOGRAPHY Bilateral 10/19/2018   Procedure: UPPER EXTREMITY VENOGRAPHY;  Surgeon: [name], MD;  Location: [facility];  Service: Cardiovascular;  Laterality: Bilateral;  Bilateral          Family History  Problem Relation Age of Onset  . Arthritis Mother   . Hypertension Mother   . Aneurysm Mother         died of brain aneurysm  . CAD Father        Has 3 stents  . Diabetes Father        borderline  . Early death Brother        Died in war    SOCIAL HISTORY: Social History        Socioeconomic History  . Marital status: Single    Spouse name: Not on file  . Number of children: Not on file  . Years of education: Not on file  . Highest education level: Not on file  Occupational History  . 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 Smoking status: Never Smoker  . Smokeless tobacco: Never Used  Vaping Use  . Vaping Use: Never used  Substance and Sexual Activity  . Alcohol use: No  . Drug use: No  . Sexual activity: Yes    Birth control/protection: None  Other Topics Concern  . Not on file  Social History Narrative   Patient reports that she is single,    Right Handed   Drinks caffeine rarely   Social Determinants of Health   Financial Resource Strain: Not on file  Food Insecurity: Not on file  Transportation Needs: Not on file  Physical Activity: Not on file  Stress: Not on file  Social Connections: Not on file  Intimate Partner Violence: Not on file         Allergies  Allergen Reactions  . Benadryl [Diphenhydramine-Zinc Acetate] Anaphylaxis    Stopped breathing in ICU  . Diphenhydramine Anaphylaxis  . Doxycycline Shortness Of Breath    Other reaction(s): Other (See Comments) Shortness of Breath  Shortness of Breath  Shortness of Breath   . Motrin [Ibuprofen] Shortness Of Breath and Itching    Per pt  . Shellfish-Derived Products     hives  . Shellfish Allergy Hives  . Banana Other (See Comments), Itching and Nausea And Vomiting    Sick on the stomach Other reaction(s): Other (See Comments) Sick on the stomach Sick on the stomach  . Chlorhexidine Itching  . Ferrous Sulfate Itching  . Iron Dextran Other (See Comments) and Itching    Vein irritation REACTION:  vein irritation REACTION: vein irritation REACTION: vein irritation REACTION: vein irritation          Current Outpatient Medications  Medication Sig Dispense Refill  . ACCU-CHEK SOFTCLIX LANCETS lancets Use to check blood sugar 4 times per day. 150 each 5  . acetaminophen (TYLENOL) 500 MG tablet Take 1,000 mg by mouth every 6 (six) hours as needed for headache (pain).     . calcium acetate (PHOSLO) 667 MG capsule Take 2 capsules (1,334 mg total) by mouth 3 (three) times daily with meals. 180 capsule 0  . Calcium Carbonate Antacid (CALCIUM CARBONATE, DOSED IN MG ELEMENTAL CALCIUM,) 1250 MG/5ML SUSP  Take 5 mLs by mouth 3 (three) times daily.     . cetirizine (ZYRTEC) 10 MG tablet Take 10 mg by mouth daily as needed for allergies.    . D 1000 25 MCG (1000 UT) capsule Take 1,000 Units by mouth every other day.     . fluticasone (FLONASE) 50 MCG/ACT nasal spray Place 2 sprays into both nostrils daily as needed for allergies.    Marland Kitchen gabapentin (NEURONTIN) 400 MG capsule Take 400 mg by mouth 3 (three) times daily.    . hydrOXYzine (ATARAX/VISTARIL) 50 MG tablet Take 1 tablet (50 mg total) by mouth 2 (two) times daily as needed for itching. (Patient taking differently: Take 50 mg by mouth 2 (two) times daily as needed for itching.) 30 tablet 0  . insulin aspart (NOVOLOG) 100 UNIT/ML FlexPen Inject 2-10 Units into the skin 3 (three) times daily with meals. as needed for blood sugar management (sliding scale)    . insulin degludec (TRESIBA) 100 UNIT/ML FlexTouch Pen Inject 20 Units into the skin daily.    Marland Kitchen lidocaine-prilocaine (EMLA) cream Apply 1 application topically See admin instructions. Apply topically one hour prior to dialysis on Monday, Wednesday, Friday    . loperamide (IMODIUM) 1 MG/5ML solution Take 2 mg by mouth 2 (two) times daily as needed for diarrhea or loose stools.     . Methoxy PEG-Epoetin Beta (MIRCERA IJ) Mircera    . metoCLOPramide (REGLAN) 10 MG tablet  Take 1 tablet (10 mg total) by mouth 3 (three) times daily. 30 tablet 0  . metoprolol tartrate (LOPRESSOR) 25 MG tablet Take 1 tablet (25 mg total) by mouth 2 (two) times daily. 60 tablet 3  . oxyCODONE-acetaminophen (PERCOCET/ROXICET) 5-325 MG tablet Take 1 tablet by mouth 2 (two) times daily as needed for moderate pain. 10 tablet 0  . pantoprazole (PROTONIX) 40 MG tablet Take 40 mg by mouth 2 (two) times daily.     . promethazine (PHENERGAN) 25 MG tablet Take 25 mg by mouth every 6 (six) hours as needed for nausea or vomiting.     . simvastatin (ZOCOR) 20 MG tablet Take 20 mg by mouth at bedtime.      No current facility-administered medications for this visit.    REVIEW OF SYSTEMS:  '[X]'$  denotes positive finding, '[ ]'$  denotes negative finding Cardiac  Comments:  Chest pain or chest pressure:    Shortness of breath upon exertion:    Short of breath when lying flat:    Irregular heart rhythm:        Vascular    Pain in calf, thigh, or hip brought on by ambulation:    Pain in feet at night that wakes you up from your sleep:     Blood clot in your veins:    Leg swelling:         Pulmonary    Oxygen at home:    Productive cough:     Wheezing:         Neurologic    Sudden weakness in arms or legs:     Sudden numbness in arms or legs:     Sudden onset of difficulty speaking or slurred speech:    Temporary loss of vision in one eye:     Problems with dizziness:         Gastrointestinal    Blood in stool:     Vomited blood:         Genitourinary    Burning when urinating:  Blood in urine:        Psychiatric    Major depression:         Hematologic    Bleeding problems:    Problems with blood clotting too easily:        Skin    Rashes or ulcers:        Constitutional    Fever or chills:      PHYSICAL EXAM:    Vitals:   05/13/20 0907  BP: 128/89  Pulse:  93  Resp: 14  Temp: 97.6 F (36.4 C)  TempSrc: Temporal  SpO2: 96%  Weight: 175 lb (79.4 kg)  Height: '5\' 6"'$  (1.676 m)    GENERAL: The patient is a well-nourished female, in no acute distress. The vital signs are documented above. CARDIAC: There is a regular rate and rhythm.  VASCULAR:  Bilateral upper extremity AV grafts that have no thrill and are occluded Palpable brachial and radial pulse in bilateral upper extremities Right femoral groin catheter for dialyssi PULMONARY: No respiratory distress. ABDOMEN: Soft and non-tender. MUSCULOSKELETAL: There are no major deformities or cyanosis. NEUROLOGIC: No focal weakness or paresthesias are detected.    DATA:   Vein mapping today shows a borderline usable left cephalic vein.  Assessment/Plan:  35 year old female with end-stage renal disease and failed kidney transplant presents for evaluation of new dialysis access.  She has failed bilateral upper extremity AV grafts that have had multiple previous revisions.  Her most recent left basilic vein fistula is now thrombosed.  I did a venogram in 2020 that showed a central stenosis on the right, so I think her only upper extremity option would be the left arm.  Vein mapping today shows a borderline usable cephalic vein although when I looked at this with [employer] in the office today it looks fairly small to me.  Discussed the other option would be trying to place a new AV graft in the left arm that would require jumping above her previous grafts.  Discussed given she has had many failed access attempts in the past there would be some risk that this does not work and next option would be groin access.  She is not interested in groin access at this time.  Risk and benefits again discussed.  [name], MD [facility] Office: [place]

## 2020-05-23 NOTE — Telephone Encounter (Signed)
Pt had L arm AVF today and called with c/o pain. She did not realize she had pain medication rx sent in by MD this morning. I have encouraged her to prop her arm up over the next several days often and to squeeze the ball they gave her. Pt verbalized understanding and will call us back if she needs further assistance.

## 2020-05-23 NOTE — Op Note (Addendum)
OPERATIVE NOTE   PROCEDURE: left brachiocephalic arteriovenous fistula placement  PRE-OPERATIVE DIAGNOSIS: ESRD  POST-OPERATIVE DIAGNOSIS: same as above   SURGEON: [name], MD  ASSISTANT(S): OR Staff, Christa McClellan RNFA  ANESTHESIA: MAC  ESTIMATED BLOOD LOSS: Minimal  FINDING(S): The brachial artery was small only about 2-1/2 to 3 mm.  After the brachiocephalic fistula was sewn just below the elbow, there was a palpable thrill with a brisk radial signal at the wrist.  I was able to pass a #3 and #4 dilator proximally in the cephalic vein prior to the anastomosis with some resistance but then this improved after manual dilation.  SPECIMEN(S):  none  INDICATIONS:   [name] is a 35 y.o. female who presents with end-stage renal disease and the need for permanent dialysis access.  She has failed bilateral upper extremity grafts as well as multiple revisions of her grafts and also has a failed left arm basilic vein fistula.  She presents today for planned left arm access.  We discussed possible left upper arm redo AV graft versus brachiocephalic fistula.. The patient is aware the risks include but are not limited to: bleeding, infection, steal syndrome, nerve damage, ischemic monomelic neuropathy, failure to mature, and need for additional procedures.  The patient is aware of the risks of the procedure and elects to proceed forward.  An assistant was needed for exposure and to expedite the case.   DESCRIPTION: After full informed written consent was obtained from the patient, the patient was brought back to the operating room and placed supine upon the operating table.  Prior to induction, the patient received IV antibiotics.   After obtaining adequate anesthesia, the patient was then prepped and draped in the standard fashion for a left arm access procedure.  I turned my attention first to identifying the patient's cephalic vein at the elbow and overall this looks  usable measuring about 2.5 to 3 mm.  At this point, I injected local anesthetic to obtain a field block of the antecubitum.  In total, I injected about 10 mL of 1% lidocaine without epinephrine.  I made a transverse incision below the level of the antecubitum and dissected through the subcutaneous tissue and fascia to gain exposure of the brachial artery.  This was noted to be 2.5-3.0 mm in diameter externally.  This was dissected out proximally and distally and controlled with vessel loops .  I then dissected out the cephalic vein.  This was noted to be 3.0 mm in diameter externally.  Once we had enough vein mobilized, the distal segment of the vein was ligated with a  2-0 silk, and the vein was transected.  The proximal segment was interrogated with serial dilators.  The vein accepted a 3 mm dilator and then 4 mm dilator with some resistance initially.  I then instilled the heparinized saline into the vein and clamped it.  At this point, I reset my exposure of the brachial artery.  Patient was given 3000 units IV heparin.  I then placed the artery under tension proximally and distally.  I made an arteriotomy with a #11 blade, and then I extended the arteriotomy with a Potts scissor.  I injected heparinized saline proximal and distal to this arteriotomy.  The vein was then sewn to the artery in an end-to-side configuration with a running stitch of 6-0 Prolene.  Prior to completing this anastomosis, I allowed the vein and artery to backbleed.  There was no evidence of clot from  any vessels.  I completed the anastomosis in the usual fashion and then released all vessel loops and clamps.    There was a palpable thrill in the venous outflow, and there was a dopplerable radial signal.  At this point, I irrigated out the surgical wound.  There was no further active bleeding.  The subcutaneous tissue was reapproximated with a running stitch of 3-0 Vicryl.  The skin was then reapproximated with a running subcuticular  stitch of 4-0 Monocryl.  The skin was then cleaned, dried, and reinforced with Dermabond.  The patient tolerated this procedure well.   COMPLICATIONS: None  CONDITION: [name], MD [facility] Office: [place]   05/23/2020, 9:36 AM

## 2020-05-23 NOTE — Discharge Instructions (Signed)
[facility]  Discharge Instructions  AV Fistula or Graft Surgery for Dialysis Access  Please refer to the following instructions for your post-procedure care. Your surgeon or physician assistant will discuss any changes with you.  Activity  You may drive the day following your surgery, if you are comfortable and no longer taking prescription pain medication. Resume full activity as the soreness in your incision resolves.  Bathing/Showering  You may shower after you go home. Keep your incision dry for 48 hours. Do not soak in a bathtub, hot tub, or swim until the incision heals completely. You may not shower if you have a hemodialysis catheter.  Incision Care  Clean your incision with mild soap and water after 48 hours. Pat the area dry with a clean towel. You do not need a bandage unless otherwise instructed. Do not apply any ointments or creams to your incision. You may have skin glue on your incision. Do not peel it off. It will come off on its own in about one week. Your arm may swell a bit after surgery. To reduce swelling use pillows to elevate your arm so it is above your heart. Your doctor will tell you if you need to lightly wrap your arm with an ACE bandage.  Diet  Resume your normal diet. There are not special food restrictions following this procedure. In order to heal from your surgery, it is CRITICAL to get adequate nutrition. Your body requires vitamins, minerals, and protein. Vegetables are the best source of vitamins and minerals. Vegetables also provide the perfect balance of protein. Processed food has little nutritional value, so try to avoid this.  Medications  Resume taking all of your medications. If your incision is causing pain, you may take over-the counter pain relievers such as acetaminophen (Tylenol). If you were prescribed a stronger pain medication, please be aware these medications can cause nausea and constipation. Prevent  nausea by taking the medication with a snack or meal. Avoid constipation by drinking plenty of fluids and eating foods with high amount of fiber, such as fruits, vegetables, and grains.  Do not take Tylenol if you are taking prescription pain medications.  Follow up Your surgeon may want to see you in the office following your access surgery. If so, this will be arranged at the time of your surgery.  Please call us immediately for any of the following conditions:  . Increased pain, redness, drainage (pus) from your incision site . Fever of 101 degrees or higher . Severe or worsening pain at your incision site . Hand pain or numbness. .  Reduce your risk of vascular disease:  . Stop smoking. If you would like help, call [facility] at 1-800-QUIT-NOW 9848175182) or [place] at [phone]  . Manage your cholesterol . Maintain a desired weight . Control your diabetes . Keep your blood pressure down  Dialysis  It will take several weeks to several months for your new dialysis access to be ready for use. Your surgeon will determine when it is okay to use it. Your nephrologist will continue to direct your dialysis. You can continue to use your Permcath until your new access is ready for use.   05/23/2020 GUERLINE PENDERGRAST [id number] Oct 19, 1985  Surgeon(s): [name], MD  Procedure(s): LEFT ARM ARTERIOVENOUS (AV) FISTULA CREATION   May stick graft immediately   May stick graft on designated area only:   x Do not stick until follow-up in office in 4-6 weeks  If you have any questions, please call the office at [phone].

## 2020-05-23 NOTE — Transfer of Care (Signed)
Immediate Anesthesia Transfer of Care Note  Patient: [name]  Procedure(s) Performed: LEFT ARM ARTERIOVENOUS (AV) FISTULA CREATION (Left Arm Upper)  Patient Location: PACU  Anesthesia Type:MAC  Level of Consciousness: awake, drowsy and patient cooperative  Airway & Oxygen Therapy: Patient Spontanous Breathing and Patient connected to nasal cannula oxygen  Post-op Assessment: Report given to RN, Post -op Vital signs reviewed and stable and Patient moving all extremities X 4  Post vital signs: Reviewed and stable  Last Vitals:  Vitals Value Taken Time  BP 91/78 05/23/20 0937  Temp    Pulse 87 05/23/20 0937  Resp 19 05/23/20 0937  SpO2 100 % 05/23/20 0937  Vitals shown include unvalidated device data.  Last Pain:  Vitals:   05/23/20 0636  TempSrc:   PainSc: 0-No pain         Complications: No complications documented.

## 2020-05-24 ENCOUNTER — Encounter (HOSPITAL_COMMUNITY): Payer: Self-pay | Admitting: Vascular Surgery

## 2020-05-30 ENCOUNTER — Encounter: Payer: Self-pay | Admitting: Cardiovascular Disease

## 2020-05-30 ENCOUNTER — Encounter: Payer: Self-pay | Admitting: General Practice

## 2020-06-06 ENCOUNTER — Ambulatory Visit: Payer: Medicare Other | Admitting: Adult Health

## 2020-06-11 ENCOUNTER — Ambulatory Visit (INDEPENDENT_AMBULATORY_CARE_PROVIDER_SITE_OTHER): Payer: Medicare Other | Admitting: Neurology

## 2020-06-11 ENCOUNTER — Other Ambulatory Visit: Payer: Self-pay

## 2020-06-11 DIAGNOSIS — G40909 Epilepsy, unspecified, not intractable, without status epilepticus: Secondary | ICD-10-CM

## 2020-06-14 ENCOUNTER — Other Ambulatory Visit: Payer: Self-pay

## 2020-06-14 DIAGNOSIS — Z992 Dependence on renal dialysis: Secondary | ICD-10-CM

## 2020-06-14 DIAGNOSIS — N186 End stage renal disease: Secondary | ICD-10-CM

## 2020-07-01 ENCOUNTER — Ambulatory Visit (HOSPITAL_COMMUNITY)
Admission: RE | Admit: 2020-07-01 | Discharge: 2020-07-01 | Disposition: A | Discharge status: Home / Self Care | Payer: Medicare Other | Source: Ambulatory Visit | Attending: Vascular Surgery | Admitting: Vascular Surgery

## 2020-07-01 ENCOUNTER — Other Ambulatory Visit: Payer: Self-pay

## 2020-07-01 DIAGNOSIS — Z992 Dependence on renal dialysis: Secondary | ICD-10-CM | POA: Diagnosis present

## 2020-07-01 DIAGNOSIS — N186 End stage renal disease: Secondary | ICD-10-CM | POA: Insufficient documentation

## 2020-07-01 NOTE — Procedures (Signed)
   HISTORY: 35 year old female presented with seizure-like event  TECHNIQUE:  This is a routine 16 channel EEG recording with one channel devoted to a limited EKG recording.  It was performed during wakefulness, drowsiness and asleep.  Hyperventilation and photic stimulation were performed as activating procedures.  There are minimum muscle and movement artifact noted.  Upon maximum arousal, posterior dominant waking rhythm consistent of rhythmic alpha range activity,  Activities are symmetric over the bilateral posterior derivations and attenuated with eye opening.  Hyperventilation produced mild/moderate buildup with higher amplitude and the slower activities noted.  Photic stimulation did not alter the tracing.  During EEG recording, patient developed drowsiness and no deeper stage of sleep was achieved  During EEG recording, there was no epileptiform discharge noted.  EKG demonstrate sinus rhythm, with heart rate of 88 bpm  CONCLUSION: This is a  normal awake EEG.  There is no electrodiagnostic evidence of epileptiform discharge.  Marcial Pacas, M.D. Ph.D.  [facility] [place], [place] [address] Phone: [phone] Fax:      [phone]

## 2020-07-04 ENCOUNTER — Other Ambulatory Visit: Payer: Self-pay

## 2020-07-04 ENCOUNTER — Ambulatory Visit (INDEPENDENT_AMBULATORY_CARE_PROVIDER_SITE_OTHER): Payer: Medicare Other | Admitting: Physician Assistant

## 2020-07-04 ENCOUNTER — Encounter: Payer: Self-pay | Admitting: Vascular Surgery

## 2020-07-04 VITALS — BP 103/69 | HR 102 | Temp 98.3°F | Resp 20 | Ht 66.0 in | Wt 182.0 lb

## 2020-07-04 DIAGNOSIS — Z992 Dependence on renal dialysis: Secondary | ICD-10-CM

## 2020-07-04 DIAGNOSIS — N186 End stage renal disease: Secondary | ICD-10-CM

## 2020-07-04 NOTE — Progress Notes (Signed)
POST OPERATIVE DIALYSIS ACCESS OFFICE NOTE    CC:  F/u for dialysis access surgery  HPI:  This is a 35 y.o. female who is s/p left brachiocephalic arteriovenous fistula on May 23, 2020.  She denies hand pain.  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  She is dialyzing via right groin tunneled dialysis catheter.   Dialysis days: Mondays Wednesdays and Fridays Dialysis center: [facility]  Allergies  Allergen Reactions  . Diphenhydramine Anaphylaxis  . Doxycycline Shortness Of Breath  . Motrin [Ibuprofen] Shortness Of Breath and Itching    Per pt  . Shellfish Allergy Hives  . Banana Itching, Nausea And Vomiting and Other (See Comments)    Sick on the stomach  . Chlorhexidine Itching  . Ferrous Sulfate Itching  . Iron Dextran Itching and Other (See Comments)    Vein irritation     Current Outpatient Medications  Medication Sig Dispense Refill  . ACCU-CHEK SOFTCLIX LANCETS lancets Use to check blood sugar 4 times per day. 150 each 5  . acetaminophen (TYLENOL) 500 MG tablet Take 1,000 mg by mouth every 6 (six) hours as needed for headache (pain).     Marland Kitchen amitriptyline (ELAVIL) 10 MG tablet 1 tablet at bedtime.    . Calcium Carbonate Antacid (CALCIUM CARBONATE, DOSED IN MG ELEMENTAL CALCIUM,) 1250 MG/5ML SUSP Take 5 mLs by mouth 3 (three) times daily.     . cetirizine (ZYRTEC) 10 MG tablet Take 10 mg by mouth daily as needed for allergies.    . D 1000 25 MCG (1000 UT) capsule Take 1,000 Units by mouth every other day.     . fluticasone (FLONASE) 50 MCG/ACT nasal spray Place 2 sprays into both nostrils daily as needed for allergies.    Marland Kitchen gabapentin (NEURONTIN) 400 MG capsule Take 400 mg by mouth 3 (three) times daily.    . heparin 1000 unit/mL SOLN injection Heparin Sodium (Porcine) 1,000 Units/mL Systemic    . hydrOXYzine (ATARAX/VISTARIL) 50 MG tablet Take 1 tablet (50 mg total) by mouth 2 (two) times daily as needed for itching. (Patient  taking differently: Take 50 mg by mouth 2 (two) times daily as needed for itching.) 30 tablet 0  . insulin aspart (NOVOLOG) 100 UNIT/ML FlexPen Inject 2-10 Units into the skin 3 (three) times daily with meals.    . insulin degludec (TRESIBA) 100 UNIT/ML FlexTouch Pen Inject 20 Units into the skin daily.    Marland Kitchen loperamide (IMODIUM) 1 MG/5ML solution Take 2 mg by mouth 2 (two) times daily as needed for diarrhea or loose stools.     . metoCLOPramide (REGLAN) 10 MG tablet Take 1 tablet (10 mg total) by mouth 3 (three) times daily. 30 tablet 0  . metoprolol tartrate (LOPRESSOR) 25 MG tablet Take 1 tablet (25 mg total) by mouth 2 (two) times daily. 60 tablet 3  . oxyCODONE-acetaminophen (PERCOCET) 5-325 MG tablet Take 1 tablet by mouth every 4 (four) hours as needed for severe pain. 20 tablet 0  . pantoprazole (PROTONIX) 40 MG tablet Take 40 mg by mouth 2 (two) times daily.     . promethazine (PHENERGAN) 25 MG tablet Take 25 mg by mouth every 6 (six) hours as needed for nausea or vomiting.     . simvastatin (ZOCOR) 20 MG tablet Take 20 mg by mouth at bedtime.      No current facility-administered medications for this visit.     ROS:  See HPI  BP 103/69 (BP  Location: Right Arm, Patient Position: Sitting, Cuff Size: Normal)   Pulse (!) 102   Temp 98.3 F (36.8 C) (Temporal)   Resp 20   Ht '5\' 6"'$  (1.676 m)   Wt 182 lb (82.6 kg)   SpO2 99%   BMI 29.38 kg/m    Physical Exam:  General appearance: Well-developed well-nourished in no apparent distress Cardiac: Rate and rhythm are regular Respiratory: Nonlabored Incision: Well-healed Extremities: 5/5 hand grip strength.  Motor function and sensation intact.  2+ radial pulse.  Good bruit and thrill in fistula.  Dialysis duplex on 07/04/2020 The fistula diameter ranges from 0.30 cm to 0.55 cm.  Depth ranges 0.18 to 0.26 cm in the upper arm and 0.62 cm at the [facility] fossa.  Assessment/Plan:   -pt does not have evidence of steal syndrome -dialysis  duplex today reveals fistula to be of marginal diameter and adequate depth for access.  It is marginally palpable -Recommend continued exercise and follow-up duplex in 1 month to assess diameter of fistula.    Barbie Banner, PA-C 07/04/2020 3:38 PM [facility] [phone]  Clinic MD: Dr. Stanford Breed on call

## 2020-07-11 ENCOUNTER — Other Ambulatory Visit: Payer: Self-pay

## 2020-07-11 DIAGNOSIS — N186 End stage renal disease: Secondary | ICD-10-CM

## 2020-07-23 ENCOUNTER — Ambulatory Visit: Payer: Medicare Other | Admitting: Physician Assistant

## 2020-07-23 NOTE — Progress Notes (Deleted)
Cardiology Office Note:    Date:  07/23/2020   ID:  EVALY GINGER, DOB 16-Apr-1985, MRN [id number]  PCP:  [name], MD  [facility] Cardiologist:  Jenkins Rouge, MD  [facility] Electrophysiologist:  None   Chief Complaint:   History of Present Illness:    [name] is a 35 y.o. female with a hx of  ESRD on HD MWF, s/p failed renal transplant, HTN, HLD, DM type 1, GERD, XXX syndrome, anemia of chronic disease and SVT  Seen by Dr. Johnsie Cancel when admitted for chest pain 01/2019. Echo with LVEF of 60-65% with normal WM. Follow up outpatient stress test 02/2019 was low risk.   Admitted 01/2020 for seizure. Seen by cardiology service for SVT/wide-complex tachycardia. No structural heart disease on echo. Follow up outpatient monitor reassuring.   Seen by Dr. Johnsie Cancel 03/2020 for preop evaluation of re-transplant. Follow up stress test 05/2020 was low risk without evidence of ischemia.  Past Medical History:  Diagnosis Date  . Anemia   . Chronic kidney disease    kidney transplant 07  . Diabetes mellitus    Pt reports diagnosis in June 2011, Type 2  . GERD (gastroesophageal reflux disease)   . Hyperlipidemia   . Hypertension   . 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 Pyelonephritis 06/23/2014  . Seasonal allergies   . UTI (urinary tract infection) 01/09/2015  . 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 ARTERIOVENOUS GRAFT PLACEMENT Bilateral    "neither work" (10/24/2017)  . AV FISTULA PLACEMENT Left 10/26/2018   Procedure: CREATION OF ARTERIOVENOUS FISTULA  LEFT ARM;  Surgeon: [name], MD;  Location: [place];  Service: Vascular;  Laterality: Left;  . AV FISTULA PLACEMENT Left 05/23/2020   Procedure: LEFT ARM ARTERIOVENOUS (AV) FISTULA  CREATION;  Surgeon: [name], MD;  Location: [place];  Service: Vascular;  Laterality: Left;  . BASCILIC VEIN TRANSPOSITION Left 12/21/2018   Procedure: Left arm BASILIC VEIN TRANSPOSITION SECOND STAGE;  Surgeon: [name], MD;  Location: [place];  Service: Vascular;  Laterality: Left;  . CHOLECYSTECTOMY N/A 06/30/2017   Procedure: LAPAROSCOPIC CHOLECYSTECTOMY WITH INTRAOPERATIVE CHOLANGIOGRAM;  Surgeon: Excell Seltzer, MD;  Location: [facility];  Service: General;  Laterality: N/A;  . ESOPHAGOGASTRODUODENOSCOPY (EGD) WITH PROPOFOL N/A 07/04/2017   Procedure: ESOPHAGOGASTRODUODENOSCOPY (EGD) WITH PROPOFOL;  Surgeon: Clarene Essex, MD;  Location: [facility];  Service: Endoscopy;  Laterality: N/A;  . INSERTION OF DIALYSIS CATHETER N/A 03/20/2018   Procedure: INSERTION OF TUNNELED DIALYSIS CATHETER - RIGHT INTERANL JUGULAR PLACEMENT;  Surgeon: Angelia Mould, MD;  Location: [place];  Service: Vascular;  Laterality: N/A;  . IR FLUORO GUIDE CV LINE RIGHT  04/18/2020  . IR GUIDED DRAIN W CATHETER PLACEMENT  10/28/2018  . KIDNEY TRANSPLANT  2007  . PARATHYROIDECTOMY  ?2012   "3/4 removed" (10/24/2017)  . RENAL BIOPSY Bilateral 2003  . UPPER EXTREMITY VENOGRAPHY Bilateral 10/19/2018   Procedure: UPPER EXTREMITY VENOGRAPHY;  Surgeon: [name], MD;  Location: [place] [facility];  Service: Cardiovascular;  Laterality: Bilateral;  Bilateral     Current Medications: No outpatient medications have been marked as taking for the 07/23/20 encounter (Appointment) with Leanor Kail, [place].  Allergies:   Diphenhydramine, Doxycycline, Motrin [ibuprofen], Shellfish allergy, Banana, Chlorhexidine, Ferrous sulfate, and Iron dextran   Social History   Socioeconomic History  . Marital status: Single    Spouse name: Not on file  . Number of children: Not on file  . Years of education: Not on file  . Highest education level: Not on file  Occupational History  . 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 Smoking status: Never Smoker  . Smokeless tobacco: Never Used  Vaping Use  . Vaping Use: Never used  Substance and Sexual Activity  . Alcohol use: No  . Drug use: No  . Sexual activity: Yes    Birth control/protection: None  Other Topics Concern  . Not on file  Social History Narrative   Patient reports that she is single,    Right Handed   Drinks caffeine rarely   Social Determinants of Health   Financial Resource Strain: Not on file  Food Insecurity: Not on file  Transportation Needs: Not on file  Physical Activity: Not on file  Stress: Not on file  Social Connections: Not on file     Family History: The patient's family history includes Aneurysm in her mother; Arthritis in her mother; CAD in her father; Diabetes in her father; Early death in her brother; Hypertension in her mother.  ***  ROS:   Please see the history of present illness.    All other systems reviewed and are negative. ***  EKGs/Labs/Other Studies Reviewed:    The following studies were reviewed today:  Stress test 05/2020  There was no ST segment deviation noted during stress.  The left ventricular ejection fraction is normal (55-65%).  Nuclear stress EF: 61%.  The study is normal.  This is a low risk study.  Monitor 03/2020 NSR Her symptoms of palpitations, tachycardia and dyspnea all showed only NSR with no arrhythmia One asymptomatic run of NSVT 13 beats    Echo 02/2020 1. Limited study due to pt vomiting; only parasternal and one apical  image obtained.  2. Left ventricular ejection fraction, by estimation, is 55 to 60%. The  left ventricle has normal function. The left ventricle has no regional  wall motion abnormalities. Left ventricular diastolic function could not  be evaluated.  3. Right ventricular systolic function is normal. The right ventricular  size is normal. Tricuspid regurgitation signal  is inadequate for assessing  PA pressure.  4. The mitral valve is normal in structure. Trivial mitral valve  regurgitation. No evidence of mitral stenosis.  5. The aortic valve has an indeterminant number of cusps. Aortic valve  regurgitation Not assessed. Not assessed.   EKG:  EKG is *** ordered today.  The ekg ordered today demonstrates ***  Recent Labs: 02/13/2020: Magnesium 2.6 04/17/2020: TSH 1.53 04/20/2020: ALT <5 04/23/2020: Platelets 290 05/23/2020: BUN 35; Creatinine, Ser 8.80; Hemoglobin 11.9; Potassium 4.9; Sodium 140  Recent Lipid Panel    Component Value Date/Time   CHOL 133 04/17/2020 1035   TRIG 255 (H) 04/17/2020 1035   TRIG 238 10/24/2008 0000   HDL 37 (L) 04/17/2020 1035   CHOLHDL 3.6 04/17/2020 1035   VLDL 48 10/24/2008 0000   LDLCALC 66 04/17/2020 1035   LDLDIRECT 65 01/20/2012 1433     Risk Assessment/Calculations:   {Does this patient have ATRIAL FIBRILLATION?:[phone]}   Physical Exam:    VS:  There were no vitals taken for this visit.  Wt Readings from Last 3 Encounters:  07/04/20 182 lb (82.6 kg)  05/23/20 178 lb (80.7 kg)  05/22/20 181 lb (82.1 kg)     GEN: *** Well nourished, well developed in no acute distress HEENT: Normal NECK: No JVD; No carotid bruits LYMPHATICS: No lymphadenopathy CARDIAC: ***RRR, no murmurs, rubs, gallops RESPIRATORY:  Clear to auscultation without rales, wheezing or rhonchi  ABDOMEN: Soft, non-tender, non-distended MUSCULOSKELETAL:  No edema; No deformity  SKIN: Warm and dry NEUROLOGIC:  Alert and oriented x 3 PSYCHIATRIC:  Normal affect   ASSESSMENT AND PLAN:    1. ***  2. ***  Medication Adjustments/Labs and Tests Ordered: Current medicines are reviewed at length with the patient today.  Concerns regarding medicines are outlined above.  No orders of the defined types were placed in this encounter.  No orders of the defined types were placed in this encounter.   There are no Patient Instructions  on file for this visit.   Jarrett Soho, [place]  07/23/2020 10:56 AM    [facility]

## 2020-07-31 ENCOUNTER — Other Ambulatory Visit: Payer: Self-pay

## 2020-07-31 ENCOUNTER — Emergency Department (HOSPITAL_COMMUNITY): Payer: Medicare Other

## 2020-07-31 ENCOUNTER — Inpatient Hospital Stay (HOSPITAL_COMMUNITY)
Admission: EM | Admit: 2020-07-31 | Discharge: 2020-08-02 | DRG: 637 | Disposition: A | Discharge status: Home / Self Care | Payer: Medicare Other | Attending: Internal Medicine | Admitting: Internal Medicine

## 2020-07-31 ENCOUNTER — Encounter (HOSPITAL_COMMUNITY): Payer: Self-pay | Admitting: Internal Medicine

## 2020-07-31 DIAGNOSIS — I959 Hypotension, unspecified: Secondary | ICD-10-CM | POA: Diagnosis not present

## 2020-07-31 DIAGNOSIS — E1010 Type 1 diabetes mellitus with ketoacidosis without coma: Secondary | ICD-10-CM | POA: Diagnosis not present

## 2020-07-31 DIAGNOSIS — Z833 Family history of diabetes mellitus: Secondary | ICD-10-CM | POA: Diagnosis not present

## 2020-07-31 DIAGNOSIS — N250 Renal osteodystrophy: Secondary | ICD-10-CM | POA: Diagnosis present

## 2020-07-31 DIAGNOSIS — F819 Developmental disorder of scholastic skills, unspecified: Secondary | ICD-10-CM | POA: Diagnosis present

## 2020-07-31 DIAGNOSIS — Z91018 Allergy to other foods: Secondary | ICD-10-CM | POA: Diagnosis not present

## 2020-07-31 DIAGNOSIS — Z8261 Family history of arthritis: Secondary | ICD-10-CM | POA: Diagnosis not present

## 2020-07-31 DIAGNOSIS — Z794 Long term (current) use of insulin: Secondary | ICD-10-CM

## 2020-07-31 DIAGNOSIS — Z91013 Allergy to seafood: Secondary | ICD-10-CM

## 2020-07-31 DIAGNOSIS — E1022 Type 1 diabetes mellitus with diabetic chronic kidney disease: Secondary | ICD-10-CM | POA: Diagnosis present

## 2020-07-31 DIAGNOSIS — E875 Hyperkalemia: Secondary | ICD-10-CM | POA: Diagnosis present

## 2020-07-31 DIAGNOSIS — B370 Candidal stomatitis: Secondary | ICD-10-CM | POA: Diagnosis present

## 2020-07-31 DIAGNOSIS — E1110 Type 2 diabetes mellitus with ketoacidosis without coma: Secondary | ICD-10-CM | POA: Diagnosis present

## 2020-07-31 DIAGNOSIS — E1043 Type 1 diabetes mellitus with diabetic autonomic (poly)neuropathy: Secondary | ICD-10-CM | POA: Diagnosis present

## 2020-07-31 DIAGNOSIS — Z992 Dependence on renal dialysis: Secondary | ICD-10-CM

## 2020-07-31 DIAGNOSIS — K3184 Gastroparesis: Secondary | ICD-10-CM | POA: Diagnosis not present

## 2020-07-31 DIAGNOSIS — N186 End stage renal disease: Secondary | ICD-10-CM | POA: Diagnosis present

## 2020-07-31 DIAGNOSIS — E785 Hyperlipidemia, unspecified: Secondary | ICD-10-CM | POA: Diagnosis present

## 2020-07-31 DIAGNOSIS — Z8249 Family history of ischemic heart disease and other diseases of the circulatory system: Secondary | ICD-10-CM

## 2020-07-31 DIAGNOSIS — Z888 Allergy status to other drugs, medicaments and biological substances status: Secondary | ICD-10-CM | POA: Diagnosis not present

## 2020-07-31 DIAGNOSIS — D631 Anemia in chronic kidney disease: Secondary | ICD-10-CM | POA: Diagnosis present

## 2020-07-31 DIAGNOSIS — Z79899 Other long term (current) drug therapy: Secondary | ICD-10-CM

## 2020-07-31 DIAGNOSIS — I120 Hypertensive chronic kidney disease with stage 5 chronic kidney disease or end stage renal disease: Secondary | ICD-10-CM | POA: Diagnosis present

## 2020-07-31 DIAGNOSIS — N2581 Secondary hyperparathyroidism of renal origin: Secondary | ICD-10-CM | POA: Diagnosis present

## 2020-07-31 DIAGNOSIS — Y830 Surgical operation with transplant of whole organ as the cause of abnormal reaction of the patient, or of later complication, without mention of misadventure at the time of the procedure: Secondary | ICD-10-CM | POA: Diagnosis present

## 2020-07-31 DIAGNOSIS — Z9114 Patient's other noncompliance with medication regimen: Secondary | ICD-10-CM

## 2020-07-31 DIAGNOSIS — K219 Gastro-esophageal reflux disease without esophagitis: Secondary | ICD-10-CM | POA: Diagnosis present

## 2020-07-31 DIAGNOSIS — T8612 Kidney transplant failure: Secondary | ICD-10-CM | POA: Diagnosis present

## 2020-07-31 DIAGNOSIS — Z20822 Contact with and (suspected) exposure to covid-19: Secondary | ICD-10-CM | POA: Diagnosis present

## 2020-07-31 DIAGNOSIS — E1143 Type 2 diabetes mellitus with diabetic autonomic (poly)neuropathy: Secondary | ICD-10-CM | POA: Diagnosis present

## 2020-07-31 DIAGNOSIS — Z881 Allergy status to other antibiotic agents status: Secondary | ICD-10-CM

## 2020-07-31 DIAGNOSIS — R569 Unspecified convulsions: Secondary | ICD-10-CM | POA: Diagnosis present

## 2020-07-31 LAB — BASIC METABOLIC PANEL
Anion gap: 23 — ABNORMAL HIGH (ref 5–15)
Anion gap: 23 — ABNORMAL HIGH (ref 5–15)
BUN: 39 mg/dL — ABNORMAL HIGH (ref 6–20)
BUN: 48 mg/dL — ABNORMAL HIGH (ref 6–20)
CO2: 25 mmol/L (ref 22–32)
CO2: 31 mmol/L (ref 22–32)
Calcium: 8 mg/dL — ABNORMAL LOW (ref 8.9–10.3)
Calcium: 8.5 mg/dL — ABNORMAL LOW (ref 8.9–10.3)
Chloride: 86 mmol/L — ABNORMAL LOW (ref 98–111)
Chloride: 86 mmol/L — ABNORMAL LOW (ref 98–111)
Creatinine, Ser: 7.39 mg/dL — ABNORMAL HIGH (ref 0.44–1.00)
Creatinine, Ser: 8.37 mg/dL — ABNORMAL HIGH (ref 0.44–1.00)
GFR, Estimated: 7 mL/min — ABNORMAL LOW (ref >60)
GFR, Estimated: 6 mL/min — ABNORMAL LOW (ref >60)
Glucose, Bld: 480 mg/dL — ABNORMAL HIGH (ref 70–99)
Glucose, Bld: 267 mg/dL — ABNORMAL HIGH (ref 70–99)
Potassium: 5 mmol/L (ref 3.5–5.1)
Potassium: 4.5 mmol/L (ref 3.5–5.1)
Sodium: 134 mmol/L — ABNORMAL LOW (ref 135–145)
Sodium: 140 mmol/L (ref 135–145)

## 2020-07-31 LAB — RESP PANEL BY RT-PCR (FLU A&B, COVID) ARPGX2
Influenza A by PCR: NEGATIVE
Influenza B by PCR: NEGATIVE
SARS Coronavirus 2 by RT PCR: NEGATIVE

## 2020-07-31 LAB — I-STAT VENOUS BLOOD GAS, ED
Acid-Base Excess: 9 mmol/L — ABNORMAL HIGH (ref 0.0–2.0)
Bicarbonate: 33.3 mmol/L — ABNORMAL HIGH (ref 20.0–28.0)
Calcium, Ion: 0.82 mmol/L — CL (ref 1.15–1.40)
HCT: 42 % (ref 36.0–46.0)
Hemoglobin: 14.3 g/dL (ref 12.0–15.0)
O2 Saturation: 70 %
Potassium: 5.9 mmol/L — ABNORMAL HIGH (ref 3.5–5.1)
Sodium: 134 mmol/L — ABNORMAL LOW (ref 135–145)
TCO2: 35 mmol/L — ABNORMAL HIGH (ref 22–32)
pCO2, Ven: 44.8 mmHg (ref 44.0–60.0)
pH, Ven: 7.479 — ABNORMAL HIGH (ref 7.250–7.430)
pO2, Ven: 34 mmHg (ref 32.0–45.0)

## 2020-07-31 LAB — CBG MONITORING, ED
Glucose-Capillary: 494 mg/dL — ABNORMAL HIGH (ref 70–99)
Glucose-Capillary: 479 mg/dL — ABNORMAL HIGH (ref 70–99)
Glucose-Capillary: 505 mg/dL (ref 70–99)
Glucose-Capillary: 493 mg/dL — ABNORMAL HIGH (ref 70–99)

## 2020-07-31 LAB — CBC
HCT: 39.5 % (ref 36.0–46.0)
Hemoglobin: 12.5 g/dL (ref 12.0–15.0)
MCH: 30.4 pg (ref 26.0–34.0)
MCHC: 31.6 g/dL (ref 30.0–36.0)
MCV: 96.1 fL (ref 80.0–100.0)
Platelets: 223 10*3/uL (ref 150–400)
RBC: 4.11 MIL/uL (ref 3.87–5.11)
RDW: 18.2 % — ABNORMAL HIGH (ref 11.5–15.5)
WBC: 9.1 10*3/uL (ref 4.0–10.5)
nRBC: 0 % (ref 0.0–0.2)

## 2020-07-31 LAB — GLUCOSE, CAPILLARY
Glucose-Capillary: 328 mg/dL — ABNORMAL HIGH (ref 70–99)
Glucose-Capillary: 151 mg/dL — ABNORMAL HIGH (ref 70–99)
Glucose-Capillary: 200 mg/dL — ABNORMAL HIGH (ref 70–99)
Glucose-Capillary: 206 mg/dL — ABNORMAL HIGH (ref 70–99)
Glucose-Capillary: 179 mg/dL — ABNORMAL HIGH (ref 70–99)
Glucose-Capillary: 169 mg/dL — ABNORMAL HIGH (ref 70–99)

## 2020-07-31 LAB — I-STAT BETA HCG BLOOD, ED (MC, WL, AP ONLY): I-stat hCG, quantitative: 6.7 m[IU]/mL — ABNORMAL HIGH (ref <5)

## 2020-07-31 LAB — MRSA PCR SCREENING: MRSA by PCR: NEGATIVE

## 2020-07-31 LAB — TROPONIN I (HIGH SENSITIVITY)
Troponin I (High Sensitivity): 59 ng/L — ABNORMAL HIGH (ref <18)
Troponin I (High Sensitivity): 118 ng/L (ref <18)

## 2020-07-31 LAB — BETA-HYDROXYBUTYRIC ACID: Beta-Hydroxybutyric Acid: 2.78 mmol/L — ABNORMAL HIGH (ref 0.05–0.27)

## 2020-07-31 LAB — HEPATITIS B SURFACE ANTIGEN: Hepatitis B Surface Ag: NONREACTIVE

## 2020-07-31 LAB — HEPATITIS B SURFACE ANTIBODY,QUALITATIVE: Hep B S Ab: REACTIVE — AB

## 2020-07-31 MED ORDER — FENTANYL CITRATE (PF) 100 MCG/2ML IJ SOLN
50.0000 ug | Freq: Once | INTRAMUSCULAR | Status: AC
Start: 2020-07-31 — End: 2020-07-31
  Administered 2020-07-31: 50 ug via INTRAVENOUS
  Filled 2020-07-31: qty 2

## 2020-07-31 MED ORDER — METOCLOPRAMIDE HCL 5 MG/ML IJ SOLN
5.0000 mg | Freq: Three times a day (TID) | INTRAMUSCULAR | Status: AC
  Administered 2020-07-31 – 2020-08-01 (×3): 5 mg via INTRAVENOUS
  Filled 2020-07-31 (×3): qty 2

## 2020-07-31 MED ORDER — DEXTROSE 50 % IV SOLN: 0.0000 mL | INTRAVENOUS | Status: DC | PRN

## 2020-07-31 MED ORDER — ACETAMINOPHEN 650 MG RE SUPP: 650.0000 mg | Freq: Four times a day (QID) | RECTAL | Status: DC | PRN

## 2020-07-31 MED ORDER — ONDANSETRON HCL 4 MG/2ML IJ SOLN
4.0000 mg | Freq: Four times a day (QID) | INTRAMUSCULAR | Status: DC | PRN
  Administered 2020-08-02: 4 mg via INTRAVENOUS
  Filled 2020-07-31: qty 2

## 2020-07-31 MED ORDER — INSULIN REGULAR(HUMAN) IN NACL 100-0.9 UT/100ML-% IV SOLN
INTRAVENOUS | Status: DC
  Administered 2020-07-31: 10 [IU]/h via INTRAVENOUS
  Filled 2020-07-31: qty 100

## 2020-07-31 MED ORDER — PANTOPRAZOLE SODIUM 40 MG PO TBEC
40.0000 mg | DELAYED_RELEASE_TABLET | Freq: Two times a day (BID) | ORAL | Status: DC
  Administered 2020-07-31 – 2020-08-02 (×3): 40 mg via ORAL
  Filled 2020-07-31 (×4): qty 1

## 2020-07-31 MED ORDER — VITAMIN D 25 MCG (1000 UNIT) PO TABS
1000.0000 [IU] | ORAL_TABLET | ORAL | Status: DC
  Administered 2020-07-31 – 2020-08-02 (×3): 1000 [IU] via ORAL
  Filled 2020-07-31 (×3): qty 1

## 2020-07-31 MED ORDER — ACETAMINOPHEN 325 MG PO TABS: 650.0000 mg | ORAL_TABLET | Freq: Four times a day (QID) | ORAL | Status: DC | PRN

## 2020-07-31 MED ORDER — FENTANYL CITRATE (PF) 100 MCG/2ML IJ SOLN
50.0000 ug | Freq: Once | INTRAMUSCULAR | Status: AC
  Administered 2020-07-31: 50 ug via INTRAVENOUS
  Filled 2020-07-31: qty 2

## 2020-07-31 MED ORDER — LACTATED RINGERS IV SOLN: INTRAVENOUS | Status: DC

## 2020-07-31 MED ORDER — GABAPENTIN 400 MG PO CAPS
400.0000 mg | ORAL_CAPSULE | Freq: Three times a day (TID) | ORAL | Status: DC
  Administered 2020-07-31 – 2020-08-01 (×3): 400 mg via ORAL
  Filled 2020-07-31 (×4): qty 1

## 2020-07-31 MED ORDER — DEXTROSE IN LACTATED RINGERS 5 % IV SOLN: INTRAVENOUS | Status: DC

## 2020-07-31 MED ORDER — INSULIN REGULAR(HUMAN) IN NACL 100-0.9 UT/100ML-% IV SOLN: INTRAVENOUS | Status: DC

## 2020-07-31 MED ORDER — CALCIUM CARBONATE ANTACID 1250 MG/5ML PO SUSP
500.0000 mg | Freq: Three times a day (TID) | ORAL | Status: DC
  Administered 2020-07-31 – 2020-08-02 (×5): 500 mg via ORAL
  Filled 2020-07-31 (×8): qty 5

## 2020-07-31 MED ORDER — HYDROMORPHONE HCL 1 MG/ML IJ SOLN: 0.5000 mg | INTRAMUSCULAR | Status: DC | PRN

## 2020-07-31 MED ORDER — ONDANSETRON HCL 4 MG PO TABS: 4.0000 mg | ORAL_TABLET | Freq: Four times a day (QID) | ORAL | Status: DC | PRN

## 2020-07-31 MED ORDER — SENNA 8.6 MG PO TABS
1.0000 | ORAL_TABLET | Freq: Two times a day (BID) | ORAL | Status: DC
  Administered 2020-07-31 – 2020-08-02 (×2): 8.6 mg via ORAL
  Filled 2020-07-31 (×3): qty 1

## 2020-07-31 MED ORDER — POTASSIUM CHLORIDE 10 MEQ/100ML IV SOLN
10.0000 meq | INTRAVENOUS | Status: DC
  Administered 2020-07-31: 10 meq via INTRAVENOUS
  Filled 2020-07-31: qty 100

## 2020-07-31 MED ORDER — METOPROLOL TARTRATE 25 MG PO TABS
25.0000 mg | ORAL_TABLET | Freq: Two times a day (BID) | ORAL | Status: DC
  Administered 2020-07-31 – 2020-08-02 (×2): 25 mg via ORAL
  Filled 2020-07-31 (×3): qty 1

## 2020-07-31 MED ORDER — LORAZEPAM 2 MG/ML IJ SOLN: 1.0000 mg | Freq: Once | INTRAMUSCULAR | Status: DC

## 2020-07-31 MED ORDER — SODIUM CHLORIDE 0.9 % IV BOLUS: 250.0000 mL | Freq: Once | INTRAVENOUS | Status: DC

## 2020-07-31 MED ORDER — CHLORHEXIDINE GLUCONATE CLOTH 2 % EX PADS
6.0000 | MEDICATED_PAD | Freq: Every day | CUTANEOUS | Status: DC
  Administered 2020-08-02: 6 via TOPICAL

## 2020-07-31 MED ORDER — HEPARIN SODIUM (PORCINE) 5000 UNIT/ML IJ SOLN
5000.0000 [IU] | Freq: Three times a day (TID) | INTRAMUSCULAR | Status: DC
  Administered 2020-07-31 – 2020-08-02 (×4): 5000 [IU] via SUBCUTANEOUS
  Filled 2020-07-31 (×5): qty 1

## 2020-07-31 NOTE — Progress Notes (Signed)
cbg- 151, insulin gtt clamp for now as per endo tool.

## 2020-07-31 NOTE — Progress Notes (Signed)
Admission from the ED by bed awake and alert. 

## 2020-07-31 NOTE — H&P (Signed)
History and Physical    [name] [id number] DOB: 1985/07/15 DOA: 07/31/2020  PCP: [name], MD Patient coming from: home via [facility] ED  Chief Complaint: abdominal pain   HPI: 35 year old with a history of DM 1, HTN, HLD, and ESRD on hemodialysis who presented to the ED with the acute onset of nausea vomiting and abdominal pain.  She had an almost identical admission 04/20/20.  She admits to not taking her insulin for "2 to 3 days because I been moving and had a lot on my plate."  In the ED she was found to have a markedly elevated blood sugar as well as an elevated beta hydroxybutyrate level.  Bicarb on chemistry panel was not low.  The patient states she did have her usual scheduled dialysis yesterday via her right femoral dialysis catheter.  Assessment/Plan  Severe uncontrolled DM1 - early DKA - insulin noncompliance Insulin drip -frequently meds -avoid high volume resuscitation due to ESRD -appears to be primarily driven by noncompliance and therefore will likely quickly correct  Intractable nausea -severe abdominal pain Suspect diabetic gastroparesis - has previously had CT abdomen/pelvis with no acute findings - trial of Reglan IV - avoid narcotics as much as possible   ESRD on dialysis M/W/F Nephrology has been consulted by ED  Anemia of chronic kidney disease  Renal osteodystrophy Resume usual home medications  History of SVT Monitor on telemetry during acute stress of DKA  HTN Uncontrolled likely due to noncompliance with medications -resume usual home prescribed medications  History of failed kidney transplant 2007  Pseudoseizures   DVT prophylaxis: Subcu heparin Code Status: Full code Family Communication: No family present at time of exam Disposition Plan: Admit to progressive care unit Consults called: Nephrology per EDP  Review of Systems: As per HPI otherwise 10 point review of systems negative.   Past Medical History:  Diagnosis Date  .  Anemia   . Chronic kidney disease    kidney transplant 07  . Diabetes mellitus    Pt reports diagnosis in June 2011, Type 2  . GERD (gastroesophageal reflux disease)   . Hyperlipidemia   . Hypertension   . 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 Pseudoseizures ([place]) 12/22/2012  . Pyelonephritis 06/23/2014  . Seasonal allergies   . UTI (urinary tract infection) 01/09/2015  . 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 ARTERIOVENOUS GRAFT PLACEMENT Bilateral    "neither work" (10/24/2017)  . AV FISTULA PLACEMENT Left 10/26/2018   Procedure: CREATION OF ARTERIOVENOUS FISTULA  LEFT ARM;  Surgeon: [name], MD;  Location: [facility];  Service: Vascular;  Laterality: Left;  . AV FISTULA PLACEMENT Left 05/23/2020   Procedure: LEFT ARM ARTERIOVENOUS (AV) FISTULA CREATION;  Surgeon: [name], MD;  Location: [facility];  Service: Vascular;  Laterality: Left;  . BASCILIC VEIN TRANSPOSITION Left 12/21/2018   Procedure: Left arm BASILIC VEIN TRANSPOSITION SECOND STAGE;  Surgeon: [name], MD;  Location: [facility];  Service: Vascular;  Laterality: Left;  . CHOLECYSTECTOMY N/A 06/30/2017   Procedure: LAPAROSCOPIC CHOLECYSTECTOMY WITH INTRAOPERATIVE CHOLANGIOGRAM;  Surgeon: Excell Seltzer, MD;  Location: [facility];  Service: General;  Laterality: N/A;  . ESOPHAGOGASTRODUODENOSCOPY (EGD) WITH PROPOFOL N/A 07/04/2017   Procedure: ESOPHAGOGASTRODUODENOSCOPY (EGD) WITH PROPOFOL;  Surgeon: Watt Climes,  Altamese Dilling, MD;  Location: [facility];  Service: Endoscopy;  Laterality: N/A;  . INSERTION OF DIALYSIS CATHETER N/A 03/20/2018   Procedure: INSERTION OF TUNNELED DIALYSIS CATHETER - RIGHT INTERANL JUGULAR PLACEMENT;  Surgeon: Angelia Mould, MD;  Location: [facility];  Service: Vascular;  Laterality: N/A;  . IR  FLUORO GUIDE CV LINE RIGHT  04/18/2020  . IR GUIDED DRAIN W CATHETER PLACEMENT  10/28/2018  . KIDNEY TRANSPLANT  2007  . PARATHYROIDECTOMY  ?2012   "3/4 removed" (10/24/2017)  . RENAL BIOPSY Bilateral 2003  . UPPER EXTREMITY VENOGRAPHY Bilateral 10/19/2018   Procedure: UPPER EXTREMITY VENOGRAPHY;  Surgeon: [name], MD;  Location: [facility];  Service: Cardiovascular;  Laterality: Bilateral;  Bilateral     Family History  Family History  Problem Relation Age of Onset  . Arthritis Mother   . Hypertension Mother   . Aneurysm Mother        died of brain aneurysm  . CAD Father        Has 3 stents  . Diabetes Father        borderline  . Early death Brother        Died in war    Social History   reports that she has never smoked. She has never used smokeless tobacco. She reports that she does not drink alcohol and does not use drugs.  Allergies Allergies  Allergen Reactions  . Diphenhydramine Anaphylaxis  . Doxycycline Shortness Of Breath  . Motrin [Ibuprofen] Shortness Of Breath and Itching    Per pt  . Shellfish Allergy Hives  . Banana Itching, Nausea And Vomiting and Other (See Comments)    Sick on the stomach  . Chlorhexidine Itching  . Ferrous Sulfate Itching  . Iron Dextran Itching and Other (See Comments)    Vein irritation     Prior to Admission medications   Medication Sig Start Date End Date Taking? Authorizing Provider  ACCU-CHEK SOFTCLIX LANCETS lancets Use to check blood sugar 4 times per day. Patient taking differently: 1 each by Other route in the morning, at noon, in the evening, and at bedtime. 12/29/15   Renato Shin, MD  acetaminophen (TYLENOL) 500 MG tablet Take 1,000 mg by mouth every 6 (six) hours as needed for headache (pain).     [provider]  amitriptyline (ELAVIL) 10 MG tablet Take 10 mg by mouth at bedtime. 05/01/20   [provider]  Calcium Carbonate Antacid (CALCIUM CARBONATE, DOSED IN MG ELEMENTAL  CALCIUM,) 1250 MG/5ML SUSP Take 5 mLs by mouth 3 (three) times daily.  11/17/18   [provider]  cetirizine (ZYRTEC) 10 MG tablet Take 10 mg by mouth daily as needed for allergies.    [provider]  D 1000 25 MCG (1000 UT) capsule Take 1,000 Units by mouth every other day.  04/27/19   [provider]  fluticasone (FLONASE) 50 MCG/ACT nasal spray Place 2 sprays into both nostrils daily as needed for allergies. 09/03/18   [name], MD  gabapentin (NEURONTIN) 400 MG capsule Take 400 mg by mouth 3 (three) times daily. 04/29/19   [provider]  heparin 1000 unit/mL SOLN injection Heparin Sodium (Porcine) 1,000 Units/mL Systemic 05/14/20 05/13/21  [provider]  hydrOXYzine (ATARAX/VISTARIL) 50 MG tablet Take 1 tablet (50 mg total) by mouth 2 (two) times daily as needed for itching. Patient taking differently: Take 50 mg by mouth 2 (two) times daily as needed for itching. 02/06/18   [name], Ripudeep  K, MD  insulin aspart (NOVOLOG) 100 UNIT/ML FlexPen Inject 2-10 Units into the skin 3 (three) times daily with meals.    [provider]  insulin degludec (TRESIBA) 100 UNIT/ML FlexTouch Pen Inject 20 Units into the skin daily. 02/15/20   Dwyane Dee, MD  loperamide (IMODIUM) 1 MG/5ML solution Take 2 mg by mouth 2 (two) times daily as needed for diarrhea or loose stools.     [provider]  metoCLOPramide (REGLAN) 10 MG tablet Take 1 tablet (10 mg total) by mouth 3 (three) times daily. 04/23/20   [name], MD  metoprolol tartrate (LOPRESSOR) 25 MG tablet Take 1 tablet (25 mg total) by mouth 2 (two) times daily. 02/15/20   Dwyane Dee, MD  oxyCODONE-acetaminophen (PERCOCET) 5-325 MG tablet Take 1 tablet by mouth every 4 (four) hours as needed for severe pain. 05/23/20 05/23/21  [name], MD  pantoprazole (PROTONIX) 40 MG tablet Take 40 mg by mouth 2 (two) times daily.     [provider]  promethazine (PHENERGAN) 25 MG  tablet Take 25 mg by mouth every 6 (six) hours as needed for nausea or vomiting.  10/13/19   [provider]  simvastatin (ZOCOR) 20 MG tablet Take 20 mg by mouth at bedtime.     [provider]    Physical Exam: Vitals:   07/31/20 1145 07/31/20 1245 07/31/20 1345 07/31/20 1445  BP: (!) 185/131 (!) 170/117 (!) 176/123 (!) 169/128  Pulse: (!) 128 (!) 117 (!) 113 (!) 111  Resp: '14 17 18 '$ (!) 21  Temp:      TempSrc:      SpO2: 99% 100% 100% 99%    Constitutional: NAD, anxious, histrionic Eyes: PERRL, lids and conjunctivae normal ENMT: Mucous membranes are dry Neck: normal, supple, no masses, no thyromegaly Respiratory: clear to auscultation bilaterally, no wheezing, no crackles. Normal respiratory effort. No accessory muscle use.  Cardiovascular: Tachycardic but regular without murmur or rub Abdomen: Severely tender throughout on even slight palpation with no evidence of mass or rebound -pain appears somewhat disproportionate to exam Musculoskeletal: no clubbing / cyanosis. No joint deformity upper and lower extremities.  HD catheter right thigh Skin: no rashes, lesions, ulcers. No induration Neurologic: CN 2-12 grossly intact. Sensation intact Psychiatric: Normal judgment and insight. Alert and oriented x 3.  Very anxious   Labs on Admission:   CBC: Recent Labs  Lab 07/31/20 1044 07/31/20 1427  WBC 9.1  --   HGB 12.5 14.3  HCT 39.5 42.0  MCV 96.1  --   PLT 223  --    Basic Metabolic Panel: Recent Labs  Lab 07/31/20 1044 07/31/20 1427  NA 134* 134*  K 5.0 5.9*  CL 86*  --   CO2 25  --   GLUCOSE 480*  --   BUN 39*  --   CREATININE 7.39*  --   CALCIUM 8.0*  --    GFR: CrCl cannot be calculated (Unknown ideal weight.).  CBG: Recent Labs  Lab 07/31/20 1140 07/31/20 1431 07/31/20 1507 07/31/20 1553  GLUCAP 494* 479* 505* 493*   Urine analysis:    Component Value Date/Time   COLORURINE RED (A) 09/03/2018 1118   APPEARANCEUR CLOUDY (A)  09/03/2018 1118   LABSPEC 1.015 09/03/2018 1118   PHURINE 8.0 09/03/2018 1118   GLUCOSEU 250 (A) 09/03/2018 1118   HGBUR LARGE (A) 09/03/2018 1118   HGBUR negative 01/26/2010 1026   BILIRUBINUR NEGATIVE 09/03/2018 1118   BILIRUBINUR NEG 01/01/2011 1050  KETONESUR NEGATIVE 09/03/2018 1118   PROTEINUR >300 (A) 09/03/2018 1118   UROBILINOGEN 0.2 01/09/2015 1333   NITRITE POSITIVE (A) 09/03/2018 1118   LEUKOCYTESUR TRACE (A) 09/03/2018 1118    Recent Results (from the past 240 hour(s))  Resp Panel by RT-PCR (Flu A&B, Covid) Nasopharyngeal Swab     Status: None   Collection Time: 07/31/20 11:45 AM   Specimen: Nasopharyngeal Swab; Nasopharyngeal(NP) swabs in vial transport medium  Result Value Ref Range Status   SARS Coronavirus 2 by RT PCR NEGATIVE NEGATIVE Final    Comment: (NOTE) SARS-CoV-2 target nucleic acids are NOT DETECTED.  The SARS-CoV-2 RNA is generally detectable in upper respiratory specimens during the acute phase of infection. The lowest concentration of SARS-CoV-2 viral copies this assay can detect is 138 copies/mL. A negative result does not preclude SARS-Cov-2 infection and should not be used as the sole basis for treatment or other patient management decisions. A negative result may occur with  improper specimen collection/handling, submission of specimen other than nasopharyngeal swab, presence of viral mutation(s) within the areas targeted by this assay, and inadequate number of viral copies(<138 copies/mL). A negative result must be combined with clinical observations, patient history, and epidemiological information. The expected result is Negative.  Fact Sheet for Patients:  EntrepreneurPulse.com.au  Fact Sheet for Healthcare Providers:  IncredibleEmployment.be  This test is no t yet approved or cleared by the [place] FDA and  has been authorized for detection and/or diagnosis of SARS-CoV-2 by FDA under an  Emergency Use Authorization (EUA). This EUA will remain  in effect (meaning this test can be used) for the duration of the COVID-19 declaration under Section 564(b)(1) of the Act, 21 U.S.C.section 360bbb-3(b)(1), unless the authorization is terminated  or revoked sooner.       Influenza A by PCR NEGATIVE NEGATIVE Final   Influenza B by PCR NEGATIVE NEGATIVE Final    Comment: (NOTE) The Xpert Xpress SARS-CoV-2/FLU/RSV plus assay is intended as an aid in the diagnosis of influenza from Nasopharyngeal swab specimens and should not be used as a sole basis for treatment. Nasal washings and aspirates are unacceptable for Xpert Xpress SARS-CoV-2/FLU/RSV testing.  Fact Sheet for Patients: EntrepreneurPulse.com.au  Fact Sheet for Healthcare Providers: IncredibleEmployment.be  This test is not yet approved or cleared by the [place] FDA and has been authorized for detection and/or diagnosis of SARS-CoV-2 by FDA under an Emergency Use Authorization (EUA). This EUA will remain in effect (meaning this test can be used) for the duration of the COVID-19 declaration under Section 564(b)(1) of the Act, 21 U.S.C. section 360bbb-3(b)(1), unless the authorization is terminated or revoked.  Performed at [facility], [place] [address] [place], [place] [address]      Radiological Exams on Admission: DG Chest 2 View  Result Date: 07/31/2020 CLINICAL DATA:  35 year old female with sharp chest pain for 2 days, nausea. Dialysis patient. EXAM: CHEST - 2 VIEW COMPARISON:  Chest radiographs 04/20/2020 and earlier. FINDINGS: Mildly larger lung volumes. Stable cardiac size and mediastinal contours. Heart size chronically at the upper limits of normal. Inferior approach dialysis type catheter tip projects at the inferior cavoatrial junction as before. Tracheal air column is within normal limits. No pneumothorax, pulmonary edema, pleural effusion or confluent  pulmonary opacity. No osseous abnormality identified. Stable cholecystectomy clips. Negative visible bowel gas pattern. IMPRESSION: No acute cardiopulmonary abnormality. Electronically Signed   By: Genevie [name] M.D.   On: 07/31/2020 11:30    EKG: Independently reviewed.  Sinus  tachycardia appreciated.  Cherene Altes, MD [facility] Office  [phone] Pager - Text Page per Amion as per below:  On-Call/Text Page:      [email or web address]  If 7PM-7AM, please contact night-coverage [email or web address] 07/31/2020, 4:13 PM

## 2020-07-31 NOTE — Progress Notes (Signed)
Feeling nauseous, reglan given, continue to monitor.

## 2020-07-31 NOTE — ED Notes (Signed)
RN called IV team for an update. IV stated it will be awhile. PA notified.

## 2020-07-31 NOTE — Progress Notes (Signed)
A consult was placed to IV Therapy for iv access; upon entering pt's room , the pt was noted to have an iv in her Left hand and staff was assessing the right arm with ultrasound;  Staff reporting the iv in the Left hand did not work;  Right arm has an old graft;  Right arm assessed by this Probation officer; very poor veins;  Was able to start an IV in her forearm using a heat pack;  10cc blood drawn and given to staff;  No other veins noted to attempt;  Suggest central access for this pt.

## 2020-07-31 NOTE — ED Notes (Signed)
IV team at bedside 

## 2020-07-31 NOTE — Consult Note (Addendum)
[facility] Renal Consultation Note    Indication for Consultation:  Management of ESRD/hemodialysis; anemia, hypertension/volume and secondary hyperparathyroidism  [id number], Christean Grief, MD  HPI: [name] is a 35 y.o. female. ESRD on HD MWF at [facility].  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   Patient presented to the ED due to abdominal, head and back pain which started last night.  Reports she has had similar pain before but does not remember the cause.  Associated symptoms include nausea and vomiting.  Patient had coffee brown emesis x1 during my exam.  States this is the 1st time vomit has been dark brown today. Denies CP, SOB, orthopnea, weakness, dizziness and fatigue.  States she was moving apartments over the last few days and has not been taking her medications as Rx.  Denies injury to her back or lifting heavy objects.  Admits to edema in R leg which has been present since having TDC exchanged about 2-3 weeks ago.  Pertinent findings since admission include hypertension, tachyardia, blood sugar >500, beta-hydroxybutyric acid 2.78, K+ 5.9, and CXR with no acute cardiopulmonary findings.  Past Medical History:  Diagnosis Date  . Anemia   . Chronic kidney disease    kidney transplant 07  . Diabetes mellitus    Pt reports diagnosis in June 2011, Type 2  . GERD (gastroesophageal reflux disease)   . Hyperlipidemia   . Hypertension   . 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 Pseudoseizures ([place]) 12/22/2012  . Pyelonephritis 06/23/2014  . Seasonal allergies   . UTI (urinary tract infection) 01/09/2015  . 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  ARTERIOVENOUS GRAFT PLACEMENT Bilateral    "neither work" (10/24/2017)  . AV FISTULA PLACEMENT Left 10/26/2018   Procedure: CREATION OF ARTERIOVENOUS FISTULA  LEFT ARM;  Surgeon: [name], MD;  Location: [place];  Service: Vascular;  Laterality: Left;  . AV FISTULA PLACEMENT Left 05/23/2020   Procedure: LEFT ARM ARTERIOVENOUS (AV) FISTULA CREATION;  Surgeon: [name], MD;  Location: [place];  Service: Vascular;  Laterality: Left;  . BASCILIC VEIN TRANSPOSITION Left 12/21/2018   Procedure: Left arm BASILIC VEIN TRANSPOSITION SECOND STAGE;  Surgeon: [name], MD;  Location: [facility];  Service: Vascular;  Laterality: Left;  . CHOLECYSTECTOMY N/A 06/30/2017   Procedure: LAPAROSCOPIC CHOLECYSTECTOMY WITH INTRAOPERATIVE CHOLANGIOGRAM;  Surgeon: Excell Seltzer, MD;  Location: [facility];  Service: General;  Laterality: N/A;  . ESOPHAGOGASTRODUODENOSCOPY (EGD) WITH PROPOFOL N/A 07/04/2017   Procedure: ESOPHAGOGASTRODUODENOSCOPY (EGD) WITH PROPOFOL;  Surgeon: Clarene Essex, MD;  Location: [facility];  Service: Endoscopy;  Laterality: N/A;  . INSERTION OF DIALYSIS CATHETER N/A 03/20/2018   Procedure: INSERTION OF TUNNELED DIALYSIS CATHETER - RIGHT INTERANL JUGULAR PLACEMENT;  Surgeon: Angelia Mould, MD;  Location: [place];  Service: Vascular;  Laterality: N/A;  . IR FLUORO GUIDE CV LINE RIGHT  04/18/2020  . IR GUIDED DRAIN W CATHETER PLACEMENT  10/28/2018  . KIDNEY TRANSPLANT  2007  . PARATHYROIDECTOMY  ?2012   "3/4 removed" (10/24/2017)  . RENAL BIOPSY Bilateral 2003  . UPPER EXTREMITY VENOGRAPHY Bilateral 10/19/2018   Procedure: UPPER EXTREMITY VENOGRAPHY;  Surgeon:  [name], MD;  Location: [facility];  Service: Cardiovascular;  Laterality: Bilateral;  Bilateral    Family History  Problem Relation Age of Onset  . Arthritis Mother   . Hypertension Mother   . Aneurysm Mother        died of brain aneurysm  . CAD Father        Has 3 stents  . Diabetes  Father        borderline  . Early death Brother        Died in war   Social History:  reports that she has never smoked. She has never used smokeless tobacco. She reports that she does not drink alcohol and does not use drugs. Allergies  Allergen Reactions  . Diphenhydramine Anaphylaxis  . Doxycycline Shortness Of Breath  . Motrin [Ibuprofen] Shortness Of Breath and Itching    Per pt  . Shellfish Allergy Hives  . Banana Itching, Nausea And Vomiting and Other (See Comments)    Sick on the stomach  . Chlorhexidine Itching  . Ferrous Sulfate Itching  . Iron Dextran Itching and Other (See Comments)    Vein irritation    Prior to Admission medications   Medication Sig Start Date End Date Taking? Authorizing Provider  ACCU-CHEK SOFTCLIX LANCETS lancets Use to check blood sugar 4 times per day. Patient taking differently: 1 each by Other route in the morning, at noon, in the evening, and at bedtime. 12/29/15   Renato Shin, MD  acetaminophen (TYLENOL) 500 MG tablet Take 1,000 mg by mouth every 6 (six) hours as needed for headache (pain).     [provider]  amitriptyline (ELAVIL) 10 MG tablet Take 10 mg by mouth at bedtime. 05/01/20   [provider]  Calcium Carbonate Antacid (CALCIUM CARBONATE, DOSED IN MG ELEMENTAL CALCIUM,) 1250 MG/5ML SUSP Take 5 mLs by mouth 3 (three) times daily.  11/17/18   [provider]  cetirizine (ZYRTEC) 10 MG tablet Take 10 mg by mouth daily as needed for allergies.    [provider]  D 1000 25 MCG (1000 UT) capsule Take 1,000 Units by mouth every other day.  04/27/19   [provider]  fluticasone (FLONASE) 50 MCG/ACT nasal spray Place 2 sprays into both nostrils daily as needed for allergies. 09/03/18   [name], MD  gabapentin (NEURONTIN) 400 MG capsule Take 400 mg by mouth 3 (three) times daily. 04/29/19   [provider]  heparin 1000 unit/mL SOLN injection Heparin Sodium (Porcine) 1,000 Units/mL  Systemic 05/14/20 05/13/21  [provider]  hydrOXYzine (ATARAX/VISTARIL) 50 MG tablet Take 1 tablet (50 mg total) by mouth 2 (two) times daily as needed for itching. Patient taking differently: Take 50 mg by mouth 2 (two) times daily as needed for itching. 02/06/18   [name], Ripudeep K, MD  insulin aspart (NOVOLOG) 100 UNIT/ML FlexPen Inject 2-10 Units into the skin 3 (three) times daily with meals.    [provider]  insulin degludec (TRESIBA) 100 UNIT/ML FlexTouch Pen Inject 20 Units into the skin daily. 02/15/20   Dwyane Dee, MD  loperamide (IMODIUM) 1 MG/5ML solution Take 2 mg by mouth 2 (two) times daily as needed for diarrhea or loose stools.     [provider]  metoCLOPramide (REGLAN) 10 MG tablet Take 1 tablet (10 mg total) by mouth 3 (three) times daily. 04/23/20   [name], MD  metoprolol tartrate (LOPRESSOR) 25 MG tablet Take 1 tablet (25 mg total) by  mouth 2 (two) times daily. 02/15/20   Dwyane Dee, MD  oxyCODONE-acetaminophen (PERCOCET) 5-325 MG tablet Take 1 tablet by mouth every 4 (four) hours as needed for severe pain. 05/23/20 05/23/21  [name], MD  pantoprazole (PROTONIX) 40 MG tablet Take 40 mg by mouth 2 (two) times daily.     [provider]  promethazine (PHENERGAN) 25 MG tablet Take 25 mg by mouth every 6 (six) hours as needed for nausea or vomiting.  10/13/19   [provider]  simvastatin (ZOCOR) 20 MG tablet Take 20 mg by mouth at bedtime.     [provider]   Current Facility-Administered Medications  Medication Dose Route Frequency Provider Last Rate Last Admin  . acetaminophen (TYLENOL) tablet 650 mg  650 mg Oral Q6H PRN Cherene Altes, MD       Or  . acetaminophen (TYLENOL) suppository 650 mg  650 mg Rectal Q6H PRN Cherene Altes, MD      . calcium carbonate (dosed in mg elemental calcium) suspension 500 mg of elemental calcium  500 mg of elemental calcium Oral TID Cherene Altes, MD       . Derrill Memo ON 08/01/2020] Chlorhexidine Gluconate Cloth 2 % PADS 6 each  6 each Topical Q0600 Alanzo Lamb, [place]      . cholecalciferol (VITAMIN D3) tablet 1,000 Units  1,000 Units Oral Shelbie Proctor, MD      . dextrose 50 % solution 0-50 mL  0-50 mL Intravenous PRN Alfredia Client, PA-C      . gabapentin (NEURONTIN) capsule 400 mg  400 mg Oral TID Cherene Altes, MD      . heparin injection 5,000 Units  5,000 Units Subcutaneous Q8H Joette Catching T, MD      . HYDROmorphone (DILAUDID) injection 0.5-1 mg  0.5-1 mg Intravenous Q4H PRN Cherene Altes, MD      . insulin regular, human (MYXREDLIN) 100 units/ 100 mL infusion   Intravenous Continuous Alfredia Client, PA-C 10 mL/hr at 07/31/20 1440 10 Units/hr at 07/31/20 1440  . lactated ringers infusion   Intravenous Continuous Cherene Altes, MD 10 mL/hr at 07/31/20 1656 New Bag at 07/31/20 1656  . LORazepam (ATIVAN) injection 1 mg  1 mg Intravenous Once Alfredia Client, PA-C      . metoCLOPramide (REGLAN) injection 5 mg  5 mg Intravenous Q8H Joette Catching T, MD      . metoprolol tartrate (LOPRESSOR) tablet 25 mg  25 mg Oral BID Joette Catching T, MD      . ondansetron [facility]) tablet 4 mg  4 mg Oral Q6H PRN Cherene Altes, MD       Or  . ondansetron (ZOFRAN) injection 4 mg  4 mg Intravenous Q6H PRN Cherene Altes, MD      . pantoprazole (PROTONIX) EC tablet 40 mg  40 mg Oral BID Cherene Altes, MD      . senna (SENOKOT) tablet 8.6 mg  1 tablet Oral BID Cherene Altes, MD       Labs: Basic Metabolic Panel: Recent Labs  Lab 07/31/20 1044 07/31/20 1427  NA 134* 134*  K 5.0 5.9*  CL 86*  --   CO2 25  --   GLUCOSE 480*  --   BUN 39*  --   CREATININE 7.39*  --   CALCIUM 8.0*  --    CBC: Recent Labs  Lab 07/31/20 1044 07/31/20 1427  WBC 9.1  --   HGB  12.5 14.3  HCT 39.5 42.0  MCV 96.1  --   PLT 223  --   CBG: Recent Labs  Lab 07/31/20 1140 07/31/20 1431 07/31/20 1507 07/31/20 1553   GLUCAP 494* 479* 505* 493*   Studies/Results: DG Chest 2 View  Result Date: 07/31/2020 CLINICAL DATA:  35 year old female with sharp chest pain for 2 days, nausea. Dialysis patient. EXAM: CHEST - 2 VIEW COMPARISON:  Chest radiographs 04/20/2020 and earlier. FINDINGS: Mildly larger lung volumes. Stable cardiac size and mediastinal contours. Heart size chronically at the upper limits of normal. Inferior approach dialysis type catheter tip projects at the inferior cavoatrial junction as before. Tracheal air column is within normal limits. No pneumothorax, pulmonary edema, pleural effusion or confluent pulmonary opacity. No osseous abnormality identified. Stable cholecystectomy clips. Negative visible bowel gas pattern. IMPRESSION: No acute cardiopulmonary abnormality. Electronically Signed   By: Genevie [name] M.D.   On: 07/31/2020 11:30    ROS: All others negative except those listed in HPI.   Physical Exam: Vitals:   07/31/20 1345 07/31/20 1445 07/31/20 1600 07/31/20 1647  BP: (!) 176/123 (!) 169/128    Pulse: (!) 113 (!) 111  (!) 113  Resp: 18 (!) 21    Temp:    97.8 F (36.6 C)  TempSrc:    Oral  SpO2: 100% 99%    Weight:   77.5 kg      General: WDWN female in obvious pain, moaning and moving around in bed and NAD Head: NCAT sclera not icteric MMM Neck: Supple. No lymphadenopathy Lungs: CTA bilaterally. No wheeze, rales or rhonchi. Breathing is unlabored. Heart: RRR. No murmur, rubs or gallops.  Abdomen: soft, nontender, +BS, no guarding, no rebound tenderness Lower extremities:trace edema on R, no edema on L  Neuro: AAOx3. Moves all extremities spontaneously. Psych:  Responds to questions appropriately with a normal affect. Dialysis Access: R femoral TDC  Dialysis Orders:  MWF [facility]  4h  350/800  79.5kg  2K/ 3.5Ca bath  P4  TDC  Hep 4000+ [id number]  - mircera 1537m q2wks last 07/21/20  - Venofer '50mg'$  qwk  Assessment/Plan: 1.  Uncontrolled DMT1/Early DKA - non compliance with  insulin.  Blood >500 now improved to 150s.   2. Abdominal pain/n/v - Hx gastroparesis. 1 episode coffee ground emesis.  IV Reglan started.  3. Hyperkalemia - K+5.9, repeat labs pending. Order dose of lokelma if remains elevated.   4.  ESRD -  On HD MWF.  Orders written for HD tomorrow per regular schedule.  5.  Hypertension/volume  - BP up and down.  Follow. Does not appear grossly volume overloaded. UF to dry as tolerated.  6.  Anemia of CKD - Hgb 14.3. No indication for ESA.  7.  Secondary Hyperparathyroidism - Ca a little low, on increased Ca bath. Will check phos. Continue binders and VDRA.  8.  Nutrition - Renal diet w/fluid restrictions.  9. Pseudoseizures 10. Hx SVT  LiJen MowPA-C [facility] 07/31/2020, 5:27 PM

## 2020-07-31 NOTE — ED Notes (Signed)
pt refuses for BP to be taken bc she states its too tight

## 2020-07-31 NOTE — ED Notes (Signed)
PT states the potassium is burning. RN diluted it w NS. Pt asked for potassium to be unhooked. MD aware.

## 2020-07-31 NOTE — Plan of Care (Signed)

## 2020-07-31 NOTE — ED Provider Notes (Signed)
[facility] EMERGENCY DEPARTMENT Provider Note   CSN: [id number] Arrival date & time: 07/31/20  1019     History No chief complaint on file.   [name] is a 35 y.o. 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  Patient states that this all started yesterday after she had dialysis.  Patient states that she had a full session of dialysis.  States that she normally gets 3-1/2 L of fluid drawn, however states that yesterday she got 5 L of fluid drawn.  States that she thinks that they took too much fluid off of her.  Patient stating that her myalgias are everywhere, however are primarily in her abdomen and chest.  States that she has been vomiting, EMS did give 4 mg of Zofran and 250 mL of fluid in route.  Patient states that she is been compliant with her medications.  Denies any cough or URI symptoms.  States that she has been vaccinated against COVID.  Abdominal pain is diffuse, does not radiate anywhere.  Denies any fevers.  Per chart review patient had exact similar symptoms and presentation in February of this year, was admitted at that time.  Patient has a brachiocephalic AV fistula that was placed on March 2022.  HPI     Past Medical History:  Diagnosis Date  . Anemia   . Chronic kidney disease    kidney transplant 07  . Diabetes mellitus    Pt reports diagnosis in June 2011, Type 2  . GERD (gastroesophageal reflux disease)   . Hyperlipidemia   . Hypertension   . 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 Pyelonephritis 06/23/2014  . Seasonal allergies   . UTI (urinary tract infection) 01/09/2015  . 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    Qualifier: Diagnosis of  By: Carlena Sax  MD, Colletta [place]      Patient Active Problem List   Diagnosis Date Noted  . DKA, type 2 ([place]) 07/31/2020  . Seizure disorder ([place]) 04/29/2020  . Polyneuropathy associated with underlying disease ([place]) 04/29/2020  . Renal osteodystrophy 04/22/2020  . Hiatal hernia 04/22/2020  . Overweight (BMI 25.0-29.9) 04/22/2020  . Uncontrolled type 1 diabetes mellitus with hyperglycemia ([place]) 04/20/2020  . Type 1 diabetes mellitus with hyperosmolar hyperglycemic state (HHS) ([place]) 04/20/2020  . Hyponatremia 04/20/2020  . GERD (gastroesophageal reflux disease) 04/20/2020  . Abdominal pain 04/20/2020  . Prolonged QT interval 04/20/2020  . Streptococcus, group b, as the cause of diseases classified elsewhere 04/17/2020  . Wide-complex tachycardia ([place])   . Allergy, unspecified, initial encounter 11/07/2019  . Anaphylactic shock, unspecified, initial encounter 11/07/2019  . Hypotension 10/16/2019  . Pain in the abdomen 10/14/2019  . Hyperkalemia 10/11/2019  . Hypertensive urgency 10/11/2019  . Hyperglycemia 10/11/2019  . Intractable abdominal pain 06/19/2019  . Sinus tachycardia 06/19/2019  . Renal and perinephric abscess 11/01/2018  . Intra-abdominal abscess ([place]) 10/28/2018  . Hypertensive emergency 09/01/2018  . DKA (diabetic ketoacidosis) ([place]) 05/01/2018  . ESRD on hemodialysis ([place]) 05/01/2018  . Shortness of breath 04/20/2018  . Encounter for immunization 04/19/2018  . Iron deficiency anemia, unspecified 04/03/2018  . Coagulation defect, unspecified ([place]) 03/27/2018  . Complication of vascular dialysis catheter 03/27/2018  .  Dependence on renal dialysis ([place]) 03/27/2018  . Kidney transplant failure 03/27/2018  . 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 Other idiopathic peripheral autonomic neuropathy 03/27/2018  . Renal sclerosis, unspecified 03/27/2018  . Renal failure 03/18/2018  . Cellulitis of left leg 03/01/2018  . Hyperlipidemia 03/01/2018  .  Hypocalcemia 03/01/2018  . Metabolic acidosis 99991111  . Hypokalemia 02/03/2018  . Incontinence of bowel 02/03/2018  . Chronic pain 11/11/2017  . Diabetic gastroparesis ([place])   . Constipation 07/13/2017  . Hematemesis 07/02/2017  . Dehydration 07/02/2017  . Chronic cholecystitis 06/29/2017  . Type 1 diabetes mellitus with complication, uncontrolled ([place]) 05/25/2015  . Intractable nausea and vomiting 01/09/2015  . Renal transplant recipient   . Immunosuppressed status ([place])   . ESRD (end stage renal disease) ([place]) 09/30/2014  . Chest pain 12/22/2012  . 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 Chronic kidney disease 01/04/2010  . Chronic kidney disease, stage II (mild) 01/04/2010  . OVARIAN FAILURE, PREMATURE 03/11/2009  . 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 OBESITY 09/25/2007  . Anemia due to chronic kidney disease 09/25/2007  . LEARNING DISABILITY 09/25/2007  . Essential hypertension, benign 09/25/2007    Past Surgical History:  Procedure Laterality Date  . ARTERIOVENOUS GRAFT PLACEMENT Bilateral    "neither work" (10/24/2017)  . AV FISTULA PLACEMENT Left 10/26/2018   Procedure: CREATION OF ARTERIOVENOUS FISTULA  LEFT ARM;  Surgeon: [name], MD;  Location: [facility];  Service: Vascular;  Laterality: Left;  . AV FISTULA PLACEMENT Left 05/23/2020   Procedure: LEFT ARM ARTERIOVENOUS (AV) FISTULA CREATION;  Surgeon: [name], MD;  Location: [facility];  Service: Vascular;  Laterality: Left;  . BASCILIC VEIN TRANSPOSITION Left 12/21/2018   Procedure: Left arm BASILIC VEIN TRANSPOSITION SECOND STAGE;  Surgeon: [name], MD;  Location: [facility];  Service: Vascular;  Laterality: Left;  . CHOLECYSTECTOMY N/A 06/30/2017   Procedure: LAPAROSCOPIC CHOLECYSTECTOMY WITH INTRAOPERATIVE  CHOLANGIOGRAM;  Surgeon: Excell Seltzer, MD;  Location: [facility];  Service: General;  Laterality: N/A;  . ESOPHAGOGASTRODUODENOSCOPY (EGD) WITH PROPOFOL N/A 07/04/2017   Procedure: ESOPHAGOGASTRODUODENOSCOPY (EGD) WITH PROPOFOL;  Surgeon: Clarene Essex, MD;  [facility];  Service: Endoscopy;  Laterality: N/A;  . INSERTION OF DIALYSIS CATHETER N/A 03/20/2018   Procedure: INSERTION OF TUNNELED DIALYSIS CATHETER - RIGHT INTERANL JUGULAR PLACEMENT;  Surgeon: Angelia Mould, MD;  Location: [facility];  Service: Vascular;  Laterality: N/A;  . IR FLUORO GUIDE CV LINE RIGHT  04/18/2020  . IR GUIDED DRAIN W CATHETER PLACEMENT  10/28/2018  . KIDNEY TRANSPLANT  2007  . PARATHYROIDECTOMY  ?2012   "3/4 removed" (10/24/2017)  . RENAL BIOPSY Bilateral 2003  . UPPER EXTREMITY VENOGRAPHY Bilateral 10/19/2018   Procedure: UPPER EXTREMITY VENOGRAPHY;  Surgeon: [name], MD;  Location: [facility];  Service: Cardiovascular;  Laterality: Bilateral;  Bilateral      OB History    Gravida  0   Para  0   Term  0   Preterm  0   AB  0   Living  0     SAB  0   IAB  0   Ectopic  0   Multiple  0   Live Births  0           Family History  Problem Relation Age of Onset  .  Arthritis Mother   . Hypertension Mother   . Aneurysm Mother        died of brain aneurysm  . CAD Father        Has 3 stents  . Diabetes Father        borderline  . Early death Brother        Died in war    Social History   Tobacco Use  . Smoking status: Never Smoker  . Smokeless tobacco: Never Used  Vaping Use  . Vaping Use: Never used  Substance Use Topics  . Alcohol use: No  . Drug use: No    Home Medications Prior to Admission medications   Medication Sig Start Date End Date Taking? Authorizing Provider  ACCU-CHEK SOFTCLIX LANCETS lancets Use to check blood sugar 4 times per day. Patient taking differently: 1 each by Other route in the morning, at noon, in the evening, and at  bedtime. 12/29/15   Renato Shin, MD  acetaminophen (TYLENOL) 500 MG tablet Take 1,000 mg by mouth every 6 (six) hours as needed for headache (pain).     [provider]  amitriptyline (ELAVIL) 10 MG tablet Take 10 mg by mouth at bedtime. 05/01/20   [provider]  Calcium Carbonate Antacid (CALCIUM CARBONATE, DOSED IN MG ELEMENTAL CALCIUM,) 1250 MG/5ML SUSP Take 5 mLs by mouth 3 (three) times daily.  11/17/18   [provider]  cetirizine (ZYRTEC) 10 MG tablet Take 10 mg by mouth daily as needed for allergies.    [provider]  D 1000 25 MCG (1000 UT) capsule Take 1,000 Units by mouth every other day.  04/27/19   [provider]  fluticasone (FLONASE) 50 MCG/ACT nasal spray Place 2 sprays into both nostrils daily as needed for allergies. 09/03/18   [name], MD  gabapentin (NEURONTIN) 400 MG capsule Take 400 mg by mouth 3 (three) times daily. 04/29/19   [provider]  heparin 1000 unit/mL SOLN injection Heparin Sodium (Porcine) 1,000 Units/mL Systemic 05/14/20 05/13/21  [provider]  hydrOXYzine (ATARAX/VISTARIL) 50 MG tablet Take 1 tablet (50 mg total) by mouth 2 (two) times daily as needed for itching. Patient taking differently: Take 50 mg by mouth 2 (two) times daily as needed for itching. 02/06/18   [name], Ripudeep K, MD  insulin aspart (NOVOLOG) 100 UNIT/ML FlexPen Inject 2-10 Units into the skin 3 (three) times daily with meals.    [provider]  insulin degludec (TRESIBA) 100 UNIT/ML FlexTouch Pen Inject 20 Units into the skin daily. 02/15/20   Dwyane Dee, MD  loperamide (IMODIUM) 1 MG/5ML solution Take 2 mg by mouth 2 (two) times daily as needed for diarrhea or loose stools.     [provider]  metoCLOPramide (REGLAN) 10 MG tablet Take 1 tablet (10 mg total) by mouth 3 (three) times daily. 04/23/20   [name], MD  metoprolol tartrate (LOPRESSOR) 25 MG tablet Take 1 tablet (25 mg total) by mouth 2  (two) times daily. 02/15/20   Dwyane Dee, MD  oxyCODONE-acetaminophen (PERCOCET) 5-325 MG tablet Take 1 tablet by mouth every 4 (four) hours as needed for severe pain. 05/23/20 05/23/21  [name], MD  pantoprazole (PROTONIX) 40 MG tablet Take 40 mg by mouth 2 (two) times daily.     [provider]  promethazine (PHENERGAN) 25 MG tablet Take 25 mg by mouth every 6 (six) hours as needed for nausea or vomiting.  10/13/19   [provider]  simvastatin (ZOCOR) 20 MG tablet Take 20 mg by mouth at bedtime.     [provider]    Allergies    Diphenhydramine, Doxycycline, Motrin [ibuprofen], Shellfish allergy, Banana, Chlorhexidine, Ferrous sulfate, and Iron dextran  Review of Systems   Review of Systems  Constitutional: Negative for chills, diaphoresis, fatigue and fever.  HENT: Negative for congestion, sore throat and trouble swallowing.   Eyes: Negative for pain and visual disturbance.  Respiratory: Negative for cough, shortness of breath and wheezing.   Cardiovascular: Positive for chest pain. Negative for palpitations and leg swelling.  Gastrointestinal: Positive for abdominal pain. Negative for abdominal distention, diarrhea, nausea and vomiting.  Genitourinary: Negative for difficulty urinating.  Musculoskeletal: Positive for arthralgias and myalgias. Negative for back pain, neck pain and neck stiffness.  Skin: Negative for pallor.  Neurological: Negative for dizziness, speech difficulty, weakness and headaches.  Psychiatric/Behavioral: Negative for confusion.    Physical Exam Updated Vital Signs BP (!) 169/128   Pulse (!) 111   Temp 98.5 F (36.9 C) (Oral)   Resp (!) 21   SpO2 99%   Physical Exam Constitutional:      General: She is in acute distress.     Appearance: Normal appearance. She is not ill-appearing, toxic-appearing or diaphoretic.  HENT:     Mouth/Throat:     Mouth: Mucous membranes are moist.     Pharynx: Oropharynx is  clear.  Eyes:     General: No scleral icterus.    Extraocular Movements: Extraocular movements intact.     Pupils: Pupils are equal, round, and reactive to light.  Cardiovascular:     Rate and Rhythm: Regular rhythm. Tachycardia present.     Pulses: Normal pulses.     Heart sounds: Normal heart sounds.  Pulmonary:     Effort: Pulmonary effort is normal. No respiratory distress.     Breath sounds: Normal breath sounds. No stridor. No wheezing, rhonchi or rales.  Chest:     Chest wall: Tenderness present.  Abdominal:     General: Abdomen is flat. There is no distension.     Palpations: Abdomen is soft.     Tenderness: There is abdominal tenderness. There is no guarding or rebound.  Musculoskeletal:        General: No swelling or tenderness. Normal range of motion.     Cervical back: Normal range of motion and neck supple. No rigidity.     Right lower leg: No edema.     Left lower leg: No edema.  Skin:    General: Skin is warm and dry.     Capillary Refill: Capillary refill takes less than 2 seconds.     Coloration: Skin is not pale.  Neurological:     General: No focal deficit present.     Mental Status: She is alert and oriented to person, place, and time.  Psychiatric:        Mood and Affect: Mood normal.        Behavior: Behavior normal.     ED Results / Procedures / Treatments   Labs (all labs ordered are listed, but only abnormal results are displayed) Labs Reviewed  BASIC METABOLIC PANEL - Abnormal; Notable for the following components:      Result Value   Sodium 134 (*)    Chloride 86 (*)    Glucose, Bld 480 (*)    BUN 39 (*)    Creatinine, Ser 7.39 (*)    Calcium 8.0 (*)    GFR,  Estimated 7 (*)    Anion gap 23 (*)    All other components within normal limits  CBC - Abnormal; Notable for the following components:   RDW 18.2 (*)    All other components within normal limits  BETA-HYDROXYBUTYRIC ACID - Abnormal; Notable for the following components:    Beta-Hydroxybutyric Acid 2.78 (*)    All other components within normal limits  CBG MONITORING, ED - Abnormal; Notable for the following components:   Glucose-Capillary 494 (*)    All other components within normal limits  I-STAT BETA HCG BLOOD, ED ([facility], [facility], [facility] ONLY) - Abnormal; Notable for the following components:   I-stat hCG, quantitative 6.7 (*)    All other components within normal limits  CBG MONITORING, ED - Abnormal; Notable for the following components:   Glucose-Capillary 479 (*)    All other components within normal limits  I-STAT VENOUS BLOOD GAS, ED - Abnormal; Notable for the following components:   pH, Ven 7.479 (*)    Bicarbonate 33.3 (*)    TCO2 35 (*)    Acid-Base Excess 9.0 (*)    Sodium 134 (*)    Potassium 5.9 (*)    Calcium, Ion 0.82 (*)    All other components within normal limits  CBG MONITORING, ED - Abnormal; Notable for the following components:   Glucose-Capillary 505 (*)    All other components within normal limits  TROPONIN I (HIGH SENSITIVITY) - Abnormal; Notable for the following components:   Troponin I (High Sensitivity) 59 (*)    All other components within normal limits  RESP PANEL BY RT-PCR (FLU A&B, COVID) ARPGX2  URINALYSIS, ROUTINE W REFLEX MICROSCOPIC  BLOOD GAS, VENOUS  TROPONIN I (HIGH SENSITIVITY)    EKG EKG Interpretation  Date/Time:  Thursday Jul 31 2020 10:34:05 EDT Ventricular Rate:  118 PR Interval:  126 QRS Duration: 74 QT Interval:  380 QTC Calculation: 532 R Axis:   70 Text Interpretation: Sinus tachycardia Nonspecific ST abnormality Prolonged QT Abnormal ECG peaked t waves, similar to prior No significant change since last tracing Confirmed by Deno Etienne [phone]) on 07/31/2020 11:36:19 AM   Radiology DG Chest 2 View  Result Date: 07/31/2020 CLINICAL DATA:  35 year old female with sharp chest pain for 2 days, nausea. Dialysis patient. EXAM: CHEST - 2 VIEW COMPARISON:  Chest radiographs 04/20/2020 and earlier. FINDINGS:  Mildly larger lung volumes. Stable cardiac size and mediastinal contours. Heart size chronically at the upper limits of normal. Inferior approach dialysis type catheter tip projects at the inferior cavoatrial junction as before. Tracheal air column is within normal limits. No pneumothorax, pulmonary edema, pleural effusion or confluent pulmonary opacity. No osseous abnormality identified. Stable cholecystectomy clips. Negative visible bowel gas pattern. IMPRESSION: No acute cardiopulmonary abnormality. Electronically Signed   By: Genevie [name] M.D.   On: 07/31/2020 11:30    Procedures .Critical Care Performed by: Alfredia Client, PA-C Authorized by: Alfredia Client, PA-C   Critical care provider statement:    Critical care time (minutes):  45   Critical care was time spent personally by me on the following activities:  Discussions with consultants, evaluation of patient's response to treatment, examination of patient, ordering and performing treatments and interventions, ordering and review of laboratory studies, ordering and review of radiographic studies, pulse oximetry, re-evaluation of patient's condition, obtaining history from patient or surrogate and review of old charts     Medications Ordered in ED Medications  insulin regular, human (MYXREDLIN) 100 units/ 100 mL infusion (10 Units/hr  Intravenous New Bag/Given 07/31/20 1440)  dextrose 5 % in lactated ringers infusion (0 mLs Intravenous Hold 07/31/20 1400)  dextrose 50 % solution 0-50 mL (has no administration in time range)  potassium chloride 10 mEq in 100 mL IVPB (10 mEq Intravenous New Bag/Given 07/31/20 1442)  LORazepam (ATIVAN) injection 1 mg (has no administration in time range)  fentaNYL (SUBLIMAZE) injection 50 mcg (50 mcg Intravenous Given 07/31/20 1154)  fentaNYL (SUBLIMAZE) injection 50 mcg (50 mcg Intravenous Given 07/31/20 1429)    ED Course  I have reviewed the triage vital signs and the nursing notes.  Pertinent labs & imaging  results that were available during my care of the patient were reviewed by me and considered in my medical decision making (see chart for details).    MDM Rules/Calculators/A&P                         DIOSELYN KRETSCH is a 35 y.o. 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  Patient is actively vomiting, was already given Zofran, unfortunately QTC is prolonged.  We will give Ativan at this time.  Patient appears in acute distress, however nontoxic appearing.  Patient is hyperglycemic with his glucose reading 490.  Will obtain basic labs, chest pain appears more musculoskeletal.  Abdominal tenderness is nonfocal, no guarding.  Patient states that she feels similar to when she was here last.  CT done at that time did not show any acute intra-abdominal pathology.  Work-up today reveals glucose of 480 with anion gap and elevated beta hydroxybutyric acid.  pH is normal.  Insulin infusion started.Spoke to Dr. Gar Ponto, nephrology who does not recommend any fluids at this time, will consult. Also giving potassium at this time due to recommendations from order set.  Potassium is 5.  Troponin 59, this is not too far from patient's baseline.  Repeat pending.  Upon reevaluation patient is still having pain, fentanyl given.  Patient is still slightly tachycardic.Admitted to Dr. Thereasa Solo, [facility] hospitalist.  The patient appears reasonably stabilized for admission considering the current resources, flow, and capabilities available in the ED at this time, and I doubt any other [facility] requiring further screening and/or treatment in the ED prior to admission.   Final Clinical Impression(s) / ED Diagnoses Final diagnoses:  Diabetic ketoacidosis without coma associated with type 2 diabetes mellitus [facility])    Rx / DC  Orders ED Discharge Orders    None       Alfredia Client, PA-C 07/31/20 [place], DO 07/31/20 1555

## 2020-07-31 NOTE — ED Triage Notes (Signed)
Pt bib ems with reports of headache, chest pain. ST with peaked T waves. Reports last dialysis yesterday. Pt with hx of epilepsy and diabetes. Pt has not taken epilepsy meds in 2 weeks, or diabetic meds X2 days. Pt with nausea vomiting. CBG 546. 20g L hand. Given '4mg'$  zofran and 250cc NS en route.  98% 116 ST 152/96

## 2020-08-01 DIAGNOSIS — K3184 Gastroparesis: Secondary | ICD-10-CM

## 2020-08-01 DIAGNOSIS — E1143 Type 2 diabetes mellitus with diabetic autonomic (poly)neuropathy: Secondary | ICD-10-CM | POA: Diagnosis not present

## 2020-08-01 DIAGNOSIS — Z992 Dependence on renal dialysis: Secondary | ICD-10-CM | POA: Diagnosis not present

## 2020-08-01 DIAGNOSIS — E1110 Type 2 diabetes mellitus with ketoacidosis without coma: Secondary | ICD-10-CM | POA: Diagnosis not present

## 2020-08-01 DIAGNOSIS — N186 End stage renal disease: Secondary | ICD-10-CM | POA: Diagnosis not present

## 2020-08-01 LAB — HEMOGLOBIN A1C
Hgb A1c MFr Bld: 11 % — ABNORMAL HIGH (ref 4.8–5.6)
Mean Plasma Glucose: 269 mg/dL

## 2020-08-01 LAB — CBC
HCT: 35.1 % — ABNORMAL LOW (ref 36.0–46.0)
Hemoglobin: 10.5 g/dL — ABNORMAL LOW (ref 12.0–15.0)
MCH: 30.1 pg (ref 26.0–34.0)
MCHC: 29.9 g/dL — ABNORMAL LOW (ref 30.0–36.0)
MCV: 100.6 fL — ABNORMAL HIGH (ref 80.0–100.0)
Platelets: 184 10*3/uL (ref 150–400)
RBC: 3.49 MIL/uL — ABNORMAL LOW (ref 3.87–5.11)
RDW: 18.4 % — ABNORMAL HIGH (ref 11.5–15.5)
WBC: 7.7 10*3/uL (ref 4.0–10.5)
nRBC: 0 % (ref 0.0–0.2)

## 2020-08-01 LAB — BASIC METABOLIC PANEL
Anion gap: 16 — ABNORMAL HIGH (ref 5–15)
Anion gap: 16 — ABNORMAL HIGH (ref 5–15)
Anion gap: 16 — ABNORMAL HIGH (ref 5–15)
BUN: 53 mg/dL — ABNORMAL HIGH (ref 6–20)
BUN: 55 mg/dL — ABNORMAL HIGH (ref 6–20)
BUN: 56 mg/dL — ABNORMAL HIGH (ref 6–20)
CO2: 33 mmol/L — ABNORMAL HIGH (ref 22–32)
CO2: 32 mmol/L (ref 22–32)
CO2: 30 mmol/L (ref 22–32)
Calcium: 8.2 mg/dL — ABNORMAL LOW (ref 8.9–10.3)
Calcium: 8.1 mg/dL — ABNORMAL LOW (ref 8.9–10.3)
Calcium: 7.9 mg/dL — ABNORMAL LOW (ref 8.9–10.3)
Chloride: 88 mmol/L — ABNORMAL LOW (ref 98–111)
Chloride: 91 mmol/L — ABNORMAL LOW (ref 98–111)
Chloride: 91 mmol/L — ABNORMAL LOW (ref 98–111)
Creatinine, Ser: 8.37 mg/dL — ABNORMAL HIGH (ref 0.44–1.00)
Creatinine, Ser: 8.72 mg/dL — ABNORMAL HIGH (ref 0.44–1.00)
Creatinine, Ser: 9.2 mg/dL — ABNORMAL HIGH (ref 0.44–1.00)
GFR, Estimated: 6 mL/min — ABNORMAL LOW (ref >60)
GFR, Estimated: 6 mL/min — ABNORMAL LOW (ref >60)
GFR, Estimated: 5 mL/min — ABNORMAL LOW (ref >60)
Glucose, Bld: 162 mg/dL — ABNORMAL HIGH (ref 70–99)
Glucose, Bld: 106 mg/dL — ABNORMAL HIGH (ref 70–99)
Glucose, Bld: 143 mg/dL — ABNORMAL HIGH (ref 70–99)
Potassium: 4.3 mmol/L (ref 3.5–5.1)
Potassium: 4.2 mmol/L (ref 3.5–5.1)
Potassium: 4.1 mmol/L (ref 3.5–5.1)
Sodium: 137 mmol/L (ref 135–145)
Sodium: 139 mmol/L (ref 135–145)
Sodium: 137 mmol/L (ref 135–145)

## 2020-08-01 LAB — GLUCOSE, CAPILLARY
Glucose-Capillary: 111 mg/dL — ABNORMAL HIGH (ref 70–99)
Glucose-Capillary: 126 mg/dL — ABNORMAL HIGH (ref 70–99)
Glucose-Capillary: 139 mg/dL — ABNORMAL HIGH (ref 70–99)
Glucose-Capillary: 143 mg/dL — ABNORMAL HIGH (ref 70–99)
Glucose-Capillary: 148 mg/dL — ABNORMAL HIGH (ref 70–99)
Glucose-Capillary: 159 mg/dL — ABNORMAL HIGH (ref 70–99)
Glucose-Capillary: 161 mg/dL — ABNORMAL HIGH (ref 70–99)
Glucose-Capillary: 161 mg/dL — ABNORMAL HIGH (ref 70–99)
Glucose-Capillary: 172 mg/dL — ABNORMAL HIGH (ref 70–99)
Glucose-Capillary: 176 mg/dL — ABNORMAL HIGH (ref 70–99)
Glucose-Capillary: 194 mg/dL — ABNORMAL HIGH (ref 70–99)
Glucose-Capillary: 198 mg/dL — ABNORMAL HIGH (ref 70–99)
Glucose-Capillary: 208 mg/dL — ABNORMAL HIGH (ref 70–99)
Glucose-Capillary: 211 mg/dL — ABNORMAL HIGH (ref 70–99)
Glucose-Capillary: 218 mg/dL — ABNORMAL HIGH (ref 70–99)

## 2020-08-01 LAB — HEPATITIS B SURFACE ANTIBODY, QUANTITATIVE: Hep B S AB Quant (Post): 18.9 m[IU]/mL (ref >9.9)

## 2020-08-01 LAB — BETA-HYDROXYBUTYRIC ACID: Beta-Hydroxybutyric Acid: 0.08 mmol/L (ref 0.05–0.27)

## 2020-08-01 MED ORDER — SODIUM CHLORIDE 0.9 % IV SOLN: 100.0000 mL | INTRAVENOUS | Status: DC | PRN

## 2020-08-01 MED ORDER — INSULIN GLARGINE 100 UNIT/ML ~~LOC~~ SOLN
20.0000 [IU] | Freq: Every day | SUBCUTANEOUS | Status: DC
  Filled 2020-08-01: qty 0.2

## 2020-08-01 MED ORDER — INSULIN GLARGINE 100 UNIT/ML ~~LOC~~ SOLN
20.0000 [IU] | Freq: Every day | SUBCUTANEOUS | Status: DC
  Administered 2020-08-01: 20 [IU] via SUBCUTANEOUS
  Filled 2020-08-01: qty 0.2

## 2020-08-01 MED ORDER — ALTEPLASE 2 MG IJ SOLR: 2.0000 mg | Freq: Once | INTRAMUSCULAR | Status: DC | PRN

## 2020-08-01 MED ORDER — HYDROMORPHONE HCL 1 MG/ML IJ SOLN: 0.5000 mg | INTRAMUSCULAR | Status: DC | PRN

## 2020-08-01 MED ORDER — DEXTROSE IN LACTATED RINGERS 5 % IV SOLN: INTRAVENOUS | Status: DC

## 2020-08-01 MED ORDER — DEXTROSE 50 % IV SOLN: 0.0000 mL | INTRAVENOUS | Status: DC | PRN

## 2020-08-01 MED ORDER — LACTATED RINGERS IV SOLN: INTRAVENOUS | Status: DC

## 2020-08-01 MED ORDER — LIDOCAINE-PRILOCAINE 2.5-2.5 % EX CREA: 1.0000 "application " | TOPICAL_CREAM | CUTANEOUS | Status: DC | PRN

## 2020-08-01 MED ORDER — HEPARIN SODIUM (PORCINE) 1000 UNIT/ML DIALYSIS: 1000.0000 [IU] | INTRAMUSCULAR | Status: DC | PRN

## 2020-08-01 MED ORDER — INSULIN ASPART 100 UNIT/ML IJ SOLN
0.0000 [IU] | Freq: Three times a day (TID) | INTRAMUSCULAR | Status: DC
  Administered 2020-08-01: 4 [IU] via SUBCUTANEOUS
  Administered 2020-08-01 – 2020-08-02 (×2): 1 [IU] via SUBCUTANEOUS
  Administered 2020-08-02: 3 [IU] via SUBCUTANEOUS

## 2020-08-01 MED ORDER — PENTAFLUOROPROP-TETRAFLUOROETH EX AERO: 1.0000 "application " | INHALATION_SPRAY | CUTANEOUS | Status: DC | PRN

## 2020-08-01 MED ORDER — INSULIN GLARGINE 100 UNIT/ML ~~LOC~~ SOLN
20.0000 [IU] | Freq: Every day | SUBCUTANEOUS | Status: DC
  Filled 2020-08-01 (×2): qty 0.2

## 2020-08-01 MED ORDER — METOCLOPRAMIDE HCL 5 MG/ML IJ SOLN
5.0000 mg | Freq: Three times a day (TID) | INTRAMUSCULAR | Status: AC
  Administered 2020-08-01 – 2020-08-02 (×3): 5 mg via INTRAVENOUS
  Filled 2020-08-01 (×3): qty 2

## 2020-08-01 MED ORDER — HEPARIN SODIUM (PORCINE) 1000 UNIT/ML IJ SOLN
INTRAMUSCULAR | Status: AC
  Administered 2020-08-01: 5200 [IU] via INTRAVENOUS_CENTRAL
  Filled 2020-08-01: qty 5

## 2020-08-01 MED ORDER — LIDOCAINE HCL (PF) 1 % IJ SOLN: 5.0000 mL | INTRAMUSCULAR | Status: DC | PRN

## 2020-08-01 MED ORDER — HEPARIN SODIUM (PORCINE) 1000 UNIT/ML IJ SOLN
INTRAMUSCULAR | Status: AC
  Filled 2020-08-01: qty 1

## 2020-08-01 MED ORDER — INSULIN ASPART 100 UNIT/ML IJ SOLN
0.0000 [IU] | Freq: Every day | INTRAMUSCULAR | Status: DC
  Administered 2020-08-01: 2 [IU] via SUBCUTANEOUS

## 2020-08-01 NOTE — Progress Notes (Addendum)
Pt adamantly refusing all blood draws/finger sticks. Pt educated on importance of checking blood sugars due to being on an insulin drip for DKA. Pt. Allowed lab tech to do one more lab draw but continued to state "I dont wanna be poked anymore, it hurts". MD notified. Orders were placed for home dose basal insulin, verbal order to start D5LR at 135m/hr, and continue to educate pt on blood glucose draws.  This RN will continue to ask patient for finger sticks. Continue to monitor.   0112 Pt allowed RN to get blood glucose.

## 2020-08-01 NOTE — Progress Notes (Signed)
Inpatient Diabetes Program Recommendations  AACE/ADA: New Consensus Statement on Inpatient Glycemic Control (2015)  Target Ranges:  Prepandial:   less than 140 mg/dL      Peak postprandial:   less than 180 mg/dL (1-2 hours)      Critically ill patients:  140 - 180 mg/dL   Lab Results  Component Value Date   GLUCAP 172 (H) 08/01/2020   HGBA1C 11.0 (H) 08/01/2020    Review of Glycemic Control Results for [name], [name] (MRN [id number]) as of 08/01/2020 09:09  Ref. Range 08/01/2020 06:17 08/01/2020 07:12 08/01/2020 08:22  Glucose-Capillary Latest Ref Range: 70 - 99 mg/dL 208 (H) 198 (H) 172 (H)   Diabetes history:  DM1 Outpatient Diabetes medications:  Tresiba 20 units QHS Novolog 2-10 units TID Current orders for Inpatient glycemic control:  Lantus 10 units daily  Inpatient Diabetes Program Recommendations:    Novolog 0-9 units TID.  Will likely need meal coverage once eating as she has type 1 DM.     Will continue to follow while inpatient.  Thank you, Reche Dixon, RN, BSN Diabetes Coordinator Inpatient Diabetes Program [phone] (team pager from 8a-5p)

## 2020-08-01 NOTE — Progress Notes (Signed)
Transported to HD by bed awake and alert. And stable.

## 2020-08-01 NOTE — Progress Notes (Signed)
   07/31/20 1647  Assess: MEWS Score  Temp 97.8 F (36.6 C)  Pulse Rate (!) 113  ECG Heart Rate (!) 112  Resp 14  Level of Consciousness Alert  Assess: MEWS Score  MEWS Temp 0  MEWS Systolic 0  MEWS Pulse 2  MEWS RR 0  MEWS LOC 0  MEWS Score 2  MEWS Score Color Yellow  Assess: if the MEWS score is Yellow or Red  Were vital signs taken at a resting state? Yes  Focused Assessment No change from prior assessment  Early Detection of Sepsis Score *See Row Information* Low  MEWS guidelines implemented *See Row Information* Yes  Treat  MEWS Interventions Escalated (See documentation below)  Take Vital Signs  Increase Vital Sign Frequency  Yellow: Q 2hr X 2 then Q 4hr X 2, if remains yellow, continue Q 4hrs  Escalate  MEWS: Escalate Yellow: discuss with charge nurse/RN and consider discussing with provider and RRT  Notify: Charge Nurse/RN  Name of Charge Nurse/RN Notified creshenda  Date Charge Nurse/RN Notified 07/31/20  Time Charge Nurse/RN Notified 1739  Document  Patient Outcome Other (Comment) (assessed-stable)  Progress note created (see row info) Yes

## 2020-08-01 NOTE — Progress Notes (Signed)
Progress Note    [name]  [id number] DOB: 03/28/1985  DOA: 07/31/2020 PCP: [name], MD    Brief Narrative:     Medical records reviewed and are as summarized below:  [name] is an 35 y.o. female with a history of DM 1, HTN, HLD, and ESRD on hemodialysis who presented to the ED with the acute onset of nausea vomiting and abdominal pain.  She had an almost identical admission 04/20/20.  She admits to not taking her insulin for "2 to 3 days because I been moving and had a lot on my plate."  Assessment/Plan:   Active Problems:   Diabetic gastroparesis (HCC)   ESRD on hemodialysis ([place])   DKA, type 2 (HCC)   Severe uncontrolled DM1 - early DKA - insulin noncompliance Transitioned from insulin gtt to SQ insulin-- encouraged compliance  On [place] -unclear reason, will wean to RA  Intractable nausea -severe abdominal pain Suspect diabetic gastroparesis - has previously had CT abdomen/pelvis with no acute findings - trial of Reglan IV - avoid narcotics as much as possible   ESRD on dialysis M/W/F -HD for today  Anemia of chronic kidney disease  Renal osteodystrophy Resume usual home medications  History of SVT Monitor on telemetry   HTN Uncontrolled likely due to noncompliance with medications  -will place holding parameters on BB as likely she may not need and does not take at home as she is now hypotensive  History of failed kidney transplant 2007  Pseudoseizures   Family Communication/Anticipated D/C date and plan/Code Status   DVT prophylaxis: heparin Code Status: Full Code.  Disposition Plan: Status is: Inpatient  Remains inpatient appropriate because:Inpatient level of care appropriate due to severity of illness   Dispo: The patient is from: Home              Anticipated d/c is to: Home              Patient currently is not medically stable to d/c.   Difficult to place patient No         Medical Consultants:     renal  Subjective:   No SOB- asking about removing the O2  Objective:    Vitals:   08/01/20 0000 08/01/20 0300 08/01/20 0700 08/01/20 1100  BP: 92/65 (!) 79/59 (!) 81/62 103/69  Pulse: 92 88 87 90  Resp: '16 14 15 17  '$ Temp: 98.3 F (36.8 C) 98.2 F (36.8 C) 98.1 F (36.7 C) 98.2 F (36.8 C)  TempSrc: Oral Oral Oral Oral  SpO2: 94% 99% 98% 100%  Weight:        Intake/Output Summary (Last 24 hours) at 08/01/2020 1228 Last data filed at 08/01/2020 0800 Gross per 24 hour  Intake 759.5 ml  Output 0 ml  Net 759.5 ml   Filed Weights   07/31/20 1600  Weight: 77.5 kg    Exam:  General: Appearance:     Overweight, chronically ill appearing female in no acute distress     Lungs:     respirations unlabored  Heart:    Normal heart rate. Normal rhythm. No murmurs, rubs, or gallops.   MS:   All extremities are intact.   Neurologic:   Awake, alert, oriented x 3    Data Reviewed:   I have personally reviewed following labs and imaging studies:  Labs: Labs show the following:   Basic Metabolic Panel: Recent Labs  Lab 07/31/20 1044 07/31/20 1427 07/31/20 1744 07/31/20 2255  08/01/20 0209 08/01/20 1013  NA 134* 134* 140 137 139 137  K 5.0 5.9* 4.5 4.3 4.2 4.1  CL 86*  --  86* 88* 91* 91*  CO2 25  --  31 33* 32 30  GLUCOSE 480*  --  267* 162* 106* 143*  BUN 39*  --  48* 53* 55* 56*  CREATININE 7.39*  --  8.37* 8.37* 8.72* 9.20*  CALCIUM 8.0*  --  8.5* 8.2* 8.1* 7.9*   GFR Estimated Creatinine Clearance: 8.9 mL/min (A) (by C-G formula based on SCr of 9.2 mg/dL (H)). Liver Function Tests: No results for input(s): AST, ALT, ALKPHOS, BILITOT, PROT, ALBUMIN in the last 168 hours. No results for input(s): LIPASE, AMYLASE in the last 168 hours. No results for input(s): AMMONIA in the last 168 hours. Coagulation profile No results for input(s): INR, PROTIME in the last 168 hours.  CBC: Recent Labs  Lab 07/31/20 1044 07/31/20 1427  WBC 9.1  --   HGB 12.5  14.3  HCT 39.5 42.0  MCV 96.1  --   PLT 223  --    Cardiac Enzymes: No results for input(s): CKTOTAL, CKMB, CKMBINDEX, TROPONINI in the last 168 hours. BNP (last 3 results) No results for input(s): PROBNP in the last 8760 hours. CBG: Recent Labs  Lab 08/01/20 0822 08/01/20 0925 08/01/20 1018 08/01/20 1103 08/01/20 1221  GLUCAP 172* 126* 139* 161* 176*   D-Dimer: No results for input(s): DDIMER in the last 72 hours. Hgb A1c: Recent Labs    08/01/20 0209  HGBA1C 11.0*   Lipid Profile: No results for input(s): CHOL, HDL, LDLCALC, TRIG, CHOLHDL, LDLDIRECT in the last 72 hours. Thyroid function studies: No results for input(s): TSH, T4TOTAL, T3FREE, THYROIDAB in the last 72 hours.  Invalid input(s): FREET3 Anemia work up: No results for input(s): VITAMINB12, FOLATE, FERRITIN, TIBC, IRON, RETICCTPCT in the last 72 hours. Sepsis Labs: Recent Labs  Lab 07/31/20 1044  WBC 9.1    Microbiology Recent Results (from the past 240 hour(s))  Resp Panel by RT-PCR (Flu A&B, Covid) Nasopharyngeal Swab     Status: None   Collection Time: 07/31/20 11:45 AM   Specimen: Nasopharyngeal Swab; Nasopharyngeal(NP) swabs in vial transport medium  Result Value Ref Range Status   SARS Coronavirus 2 by RT PCR NEGATIVE NEGATIVE Final    Comment: (NOTE) SARS-CoV-2 target nucleic acids are NOT DETECTED.  The SARS-CoV-2 RNA is generally detectable in upper respiratory specimens during the acute phase of infection. The lowest concentration of SARS-CoV-2 viral copies this assay can detect is 138 copies/mL. A negative result does not preclude SARS-Cov-2 infection and should not be used as the sole basis for treatment or other patient management decisions. A negative result may occur with  improper specimen collection/handling, submission of specimen other than nasopharyngeal swab, presence of viral mutation(s) within the areas targeted by this assay, and inadequate number of viral copies(<138  copies/mL). A negative result must be combined with clinical observations, patient history, and epidemiological information. The expected result is Negative.  Fact Sheet for Patients:  EntrepreneurPulse.com.au  Fact Sheet for Healthcare Providers:  IncredibleEmployment.be  This test is no t yet approved or cleared by the [place] FDA and  has been authorized for detection and/or diagnosis of SARS-CoV-2 by FDA under an Emergency Use Authorization (EUA). This EUA will remain  in effect (meaning this test can be used) for the duration of the COVID-19 declaration under Section 564(b)(1) of the Act, 21 U.S.C.section 360bbb-3(b)(1), unless the authorization is  terminated  or revoked sooner.       Influenza A by PCR NEGATIVE NEGATIVE Final   Influenza B by PCR NEGATIVE NEGATIVE Final    Comment: (NOTE) The Xpert Xpress SARS-CoV-2/FLU/RSV plus assay is intended as an aid in the diagnosis of influenza from Nasopharyngeal swab specimens and should not be used as a sole basis for treatment. Nasal washings and aspirates are unacceptable for Xpert Xpress SARS-CoV-2/FLU/RSV testing.  Fact Sheet for Patients: EntrepreneurPulse.com.au  Fact Sheet for Healthcare Providers: IncredibleEmployment.be  This test is not yet approved or cleared by the [place] FDA and has been authorized for detection and/or diagnosis of SARS-CoV-2 by FDA under an Emergency Use Authorization (EUA). This EUA will remain in effect (meaning this test can be used) for the duration of the COVID-19 declaration under Section 564(b)(1) of the Act, 21 U.S.C. section 360bbb-3(b)(1), unless the authorization is terminated or revoked.  Performed at [facility], [place] [address], [place], [place] [address]   MRSA PCR Screening     Status: None   Collection Time: 07/31/20  4:43 PM   Specimen: Nasal Mucosa; Nasopharyngeal  Result Value Ref  Range Status   MRSA by PCR NEGATIVE NEGATIVE Final    Comment:        The GeneXpert MRSA Assay (FDA approved for NASAL specimens only), is one component of a comprehensive MRSA colonization surveillance program. It is not intended to diagnose MRSA infection nor to guide or monitor treatment for MRSA infections. Performed at [facility], [place] [address] [place], [place] [address]     Procedures and diagnostic studies:  DG Chest 2 View  Result Date: 07/31/2020 CLINICAL DATA:  35 year old female with sharp chest pain for 2 days, nausea. Dialysis patient. EXAM: CHEST - 2 VIEW COMPARISON:  Chest radiographs 04/20/2020 and earlier. FINDINGS: Mildly larger lung volumes. Stable cardiac size and mediastinal contours. Heart size chronically at the upper limits of normal. Inferior approach dialysis type catheter tip projects at the inferior cavoatrial junction as before. Tracheal air column is within normal limits. No pneumothorax, pulmonary edema, pleural effusion or confluent pulmonary opacity. No osseous abnormality identified. Stable cholecystectomy clips. Negative visible bowel gas pattern. IMPRESSION: No acute cardiopulmonary abnormality. Electronically Signed   By: Genevie [name] M.D.   On: 07/31/2020 11:30    Medications:   . calcium carbonate (dosed in mg elemental calcium)  500 mg of elemental calcium Oral TID  . Chlorhexidine Gluconate Cloth  6 each Topical Q0600  . cholecalciferol  1,000 Units Oral QODAY  . gabapentin  400 mg Oral TID  . heparin  5,000 Units Subcutaneous Q8H  . insulin aspart  0-5 Units Subcutaneous QHS  . insulin aspart  0-9 Units Subcutaneous TID WC  . insulin glargine  20 Units Subcutaneous Daily  . LORazepam  1 mg Intravenous Once  . metoCLOPramide (REGLAN) injection  5 mg Intravenous Q8H  . metoprolol tartrate  25 mg Oral BID  . pantoprazole  40 mg Oral BID  . senna  1 tablet Oral BID   Continuous Infusions:   LOS: 1 day   Geradine Girt  [facility]   How to contact the [facility] Attending or Consulting provider [place] or covering provider during after hours [place], for this patient?  1. Check the care team in [facility] and look for a) attending/consulting TR[facility] provider listed and b) the [facility] team listed 2. Log into [email or web address] and use [place]'s universal password to access.  If you do not have the password, please contact the hospital operator. 3. Locate the [facility] provider you are looking for under [facility] and page to a number that you can be directly reached. 4. If you still have difficulty reaching the provider, please page the [facility] (Director on Call) for the Hospitalists listed on amion for assistance.  08/01/2020, 12:28 PM

## 2020-08-01 NOTE — Progress Notes (Signed)
Back from hemodialysis by bed awake and alert. 

## 2020-08-01 NOTE — Progress Notes (Signed)
[facility] ROUNDING NOTE   Subjective:   Interval History: 35 year old lady end-stage renal disease Monday Wednesday Friday dialysis.  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  History of diabetes gastroparesis gastroparesis versus hypertension hyperlipidemia.   She presents to the emergency room 07/31/2020 with early DKA insulin noncompliance.  Intractable nausea and severe abdominal pain.  She will continue on dialysis Monday Wednesday Friday.  Her next dialysis treatment 08/01/2020  Blood pressure 81/62 pulse 87 temperature 98.2 O2 sats 98% 2 L nasal cannula.  Sodium 139 potassium 4.2 chloride 91 CO2 32 creatinine 8.72 BUN 55 glucose 269 calcium 8.1  Tums with meals, vitamin D, Neurontin 400 mg 3 times daily glargine insulin 20 units daily, metoprolol 25 mg twice daily Protonix 40 mg daily  Objective:  Vital signs in last 24 hours:  Temp:  [97.8 F (36.6 C)-98.5 F (36.9 C)] 98.2 F (36.8 C) (05/20 0300) Pulse Rate:  [88-128] 88 (05/20 0300) Resp:  [12-24] 14 (05/20 0300) BP: (79-185)/(59-131) 79/59 (05/20 0300) SpO2:  [80 %-100 %] 99 % (05/20 0300) Weight:  [77.5 kg] 77.5 kg (05/19 1600)  Weight change:  Filed Weights   07/31/20 1600  Weight: 77.5 kg    Intake/Output: I/O last 3 completed shifts: In: 634.5 [P.O.:120; I.V.:490.3; IV Piggyback:24.2] Out: 0    Intake/Output this shift:  No intake/output data recorded.  CVS- RRR RS- CTA ABD- BS present soft non-distended EXT- no edema   Basic Metabolic Panel: Recent Labs  Lab 07/31/20 1044 07/31/20 1427 07/31/20 1744 07/31/20 2255 08/01/20 0209  NA 134* 134* 140 137 139  K 5.0 5.9* 4.5 4.3 4.2  CL 86*  --  86* 88* 91*  CO2 25  --  31 33* 32  GLUCOSE 480*  --  267* 162* 106*  BUN 39*  --  48* 53* 55*  CREATININE 7.39*  --  8.37* 8.37* 8.72*  CALCIUM 8.0*  --  8.5* 8.2* 8.1*    Liver Function Tests: No results for input(s): AST, ALT, ALKPHOS,  BILITOT, PROT, ALBUMIN in the last 168 hours. No results for input(s): LIPASE, AMYLASE in the last 168 hours. No results for input(s): AMMONIA in the last 168 hours.  CBC: Recent Labs  Lab 07/31/20 1044 07/31/20 1427  WBC 9.1  --   HGB 12.5 14.3  HCT 39.5 42.0  MCV 96.1  --   PLT 223  --     Cardiac Enzymes: No results for input(s): CKTOTAL, CKMB, CKMBINDEX, TROPONINI in the last 168 hours.  BNP: Invalid input(s): POCBNP  CBG: Recent Labs  Lab 08/01/20 0209 08/01/20 0309 08/01/20 0414 08/01/20 0519 08/01/20 0617  GLUCAP 111* 159* 194* 218* 208*    Microbiology: Results for orders placed or performed during the hospital encounter of 07/31/20  Resp Panel by RT-PCR (Flu A&B, Covid) Nasopharyngeal Swab     Status: None   Collection Time: 07/31/20 11:45 AM   Specimen: Nasopharyngeal Swab; Nasopharyngeal(NP) swabs in vial transport medium  Result Value Ref Range Status   SARS Coronavirus 2 by RT PCR NEGATIVE NEGATIVE Final    Comment: (NOTE) SARS-CoV-2 target nucleic acids are NOT DETECTED.  The SARS-CoV-2 RNA is generally detectable in upper respiratory specimens during the acute phase of infection. The lowest concentration of SARS-CoV-2 viral copies this assay can detect is 138 copies/mL. A negative result does not preclude SARS-Cov-2 infection and should not be used as the sole basis for treatment or other patient management decisions. A  negative result may occur with  improper specimen collection/handling, submission of specimen other than nasopharyngeal swab, presence of viral mutation(s) within the areas targeted by this assay, and inadequate number of viral copies(<138 copies/mL). A negative result must be combined with clinical observations, patient history, and epidemiological information. The expected result is Negative.  Fact Sheet for Patients:  EntrepreneurPulse.com.au  Fact Sheet for Healthcare Providers:   IncredibleEmployment.be  This test is no t yet approved or cleared by the [place] FDA and  has been authorized for detection and/or diagnosis of SARS-CoV-2 by FDA under an Emergency Use Authorization (EUA). This EUA will remain  in effect (meaning this test can be used) for the duration of the COVID-19 declaration under Section 564(b)(1) of the Act, 21 U.S.C.section 360bbb-3(b)(1), unless the authorization is terminated  or revoked sooner.       Influenza A by PCR NEGATIVE NEGATIVE Final   Influenza B by PCR NEGATIVE NEGATIVE Final    Comment: (NOTE) The Xpert Xpress SARS-CoV-2/FLU/RSV plus assay is intended as an aid in the diagnosis of influenza from Nasopharyngeal swab specimens and should not be used as a sole basis for treatment. Nasal washings and aspirates are unacceptable for Xpert Xpress SARS-CoV-2/FLU/RSV testing.  Fact Sheet for Patients: EntrepreneurPulse.com.au  Fact Sheet for Healthcare Providers: IncredibleEmployment.be  This test is not yet approved or cleared by the [place] FDA and has been authorized for detection and/or diagnosis of SARS-CoV-2 by FDA under an Emergency Use Authorization (EUA). This EUA will remain in effect (meaning this test can be used) for the duration of the COVID-19 declaration under Section 564(b)(1) of the Act, 21 U.S.C. section 360bbb-3(b)(1), unless the authorization is terminated or revoked.  Performed at [facility], [place] [address] [place], [place] [address]   MRSA PCR Screening     Status: None   Collection Time: 07/31/20  4:43 PM   Specimen: Nasal Mucosa; Nasopharyngeal  Result Value Ref Range Status   MRSA by PCR NEGATIVE NEGATIVE Final    Comment:        The GeneXpert MRSA Assay (FDA approved for NASAL specimens only), is one component of a comprehensive MRSA colonization surveillance program. It is not intended to diagnose MRSA infection nor  to guide or monitor treatment for MRSA infections. Performed at [facility], [place] [address] [place], [place] [address]     Coagulation Studies: No results for input(s): LABPROT, INR in the last 72 hours.  Urinalysis: No results for input(s): COLORURINE, LABSPEC, PHURINE, GLUCOSEU, HGBUR, BILIRUBINUR, KETONESUR, PROTEINUR, UROBILINOGEN, NITRITE, LEUKOCYTESUR in the last 72 hours.  Invalid input(s): APPERANCEUR    Imaging: DG Chest 2 View  Result Date: 07/31/2020 CLINICAL DATA:  35 year old female with sharp chest pain for 2 days, nausea. Dialysis patient. EXAM: CHEST - 2 VIEW COMPARISON:  Chest radiographs 04/20/2020 and earlier. FINDINGS: Mildly larger lung volumes. Stable cardiac size and mediastinal contours. Heart size chronically at the upper limits of normal. Inferior approach dialysis type catheter tip projects at the inferior cavoatrial junction as before. Tracheal air column is within normal limits. No pneumothorax, pulmonary edema, pleural effusion or confluent pulmonary opacity. No osseous abnormality identified. Stable cholecystectomy clips. Negative visible bowel gas pattern. IMPRESSION: No acute cardiopulmonary abnormality. Electronically Signed   By: Genevie [name] M.D.   On: 07/31/2020 11:30     Medications:   . dextrose 5% lactated ringers 125 mL/hr at 08/01/20 0400  . insulin 1.4 mL/hr at 08/01/20 0400  . lactated ringers 10  mL/hr at 07/31/20 2000  . lactated ringers     . calcium carbonate (dosed in mg elemental calcium)  500 mg of elemental calcium Oral TID  . Chlorhexidine Gluconate Cloth  6 each Topical Q0600  . cholecalciferol  1,000 Units Oral QODAY  . gabapentin  400 mg Oral TID  . heparin  5,000 Units Subcutaneous Q8H  . insulin glargine  20 Units Subcutaneous Daily  . LORazepam  1 mg Intravenous Once  . metoprolol tartrate  25 mg Oral BID  . pantoprazole  40 mg Oral BID  . senna  1 tablet Oral BID   acetaminophen **OR** acetaminophen, dextrose,  dextrose, HYDROmorphone (DILAUDID) injection, ondansetron **OR** ondansetron (ZOFRAN) IV  Assessment/ Plan:  1.  Uncontrolled DMT1/Early DKA - non compliance with insulin.  Blood >500 now improved to 150s.    Consider psych consult. 2. Abdominal pain/n/v - Hx gastroparesis. 1 episode coffee ground emesis.  IV Reglan started.  3. Hyperkalemia -appears improved. 4.  ESRD -  On HD MWF.    Dialysis 08/01/2020 5.  Hypertension/volume  - BP labile blood pressures. 6.  Anemia of CKD - Hgb 14.3. No indication for ESA.  7.  Secondary Hyperparathyroidism - Ca a little low, on increased Ca bath. Will check phos. Continue binders and VDRA.  8.  Nutrition - Renal diet w/fluid restrictions.  9. Pseudoseizures 10. Hx SVT    LOS: 1 Sherril Croon '@TODAY''@7'$ :07 AM

## 2020-08-02 DIAGNOSIS — N186 End stage renal disease: Secondary | ICD-10-CM | POA: Diagnosis not present

## 2020-08-02 DIAGNOSIS — E1110 Type 2 diabetes mellitus with ketoacidosis without coma: Secondary | ICD-10-CM | POA: Diagnosis not present

## 2020-08-02 DIAGNOSIS — Z992 Dependence on renal dialysis: Secondary | ICD-10-CM | POA: Diagnosis not present

## 2020-08-02 LAB — RENAL FUNCTION PANEL
Albumin: 3.5 g/dL (ref 3.5–5.0)
Anion gap: 13 (ref 5–15)
BUN: 32 mg/dL — ABNORMAL HIGH (ref 6–20)
CO2: 26 mmol/L (ref 22–32)
Calcium: 7.7 mg/dL — ABNORMAL LOW (ref 8.9–10.3)
Chloride: 96 mmol/L — ABNORMAL LOW (ref 98–111)
Creatinine, Ser: 7.29 mg/dL — ABNORMAL HIGH (ref 0.44–1.00)
GFR, Estimated: 7 mL/min — ABNORMAL LOW (ref >60)
Glucose, Bld: 150 mg/dL — ABNORMAL HIGH (ref 70–99)
Phosphorus: 5.3 mg/dL — ABNORMAL HIGH (ref 2.5–4.6)
Potassium: 4.4 mmol/L (ref 3.5–5.1)
Sodium: 135 mmol/L (ref 135–145)

## 2020-08-02 LAB — GLUCOSE, CAPILLARY
Glucose-Capillary: 129 mg/dL — ABNORMAL HIGH (ref 70–99)
Glucose-Capillary: 208 mg/dL — ABNORMAL HIGH (ref 70–99)

## 2020-08-02 MED ORDER — METOCLOPRAMIDE HCL 5 MG/ML IJ SOLN: 5.0000 mg | Freq: Two times a day (BID) | INTRAMUSCULAR | Status: DC

## 2020-08-02 MED ORDER — NYSTATIN 100000 UNIT/ML MT SUSP
5.0000 mL | Freq: Four times a day (QID) | OROMUCOSAL | Status: DC
  Administered 2020-08-02: 500000 [IU] via ORAL
  Filled 2020-08-02: qty 5

## 2020-08-02 MED ORDER — METOCLOPRAMIDE HCL 5 MG PO TABS: 10.0000 mg | ORAL_TABLET | Freq: Two times a day (BID) | ORAL | Status: DC

## 2020-08-02 MED ORDER — NYSTATIN 100000 UNIT/ML MT SUSP: 5.0000 mL | Freq: Four times a day (QID) | OROMUCOSAL | 0 refills | Status: AC

## 2020-08-02 NOTE — Progress Notes (Addendum)
Pt stated it's hard to swallow pills and food. She stated "It comes right up and sometimes it gets stuck in my throat".  Notified provider

## 2020-08-02 NOTE — Progress Notes (Signed)
Pt got discharged to home, discharge instructions provided and patient showed understanding to it, IV taken out,Telemonitor DC,pt left unit in wheelchair with all of the belongings accompanied with her boyfriend.  Palma Holter, RN

## 2020-08-02 NOTE — Discharge Summary (Signed)
Physician Discharge Summary  [name] [id number] DOB: Aug 04, 1985 DOA: 07/31/2020  PCP: [name], MD  Admit date: 07/31/2020 Discharge date: 08/02/2020  Admitted From: home Discharge disposition: home   Recommendations for Outpatient Follow-Up:   1. Encouraged compliance with insulin 2. outpatient sleep study 3. -can consider resuming Neurontin as an outpatient    Discharge Diagnosis:   Active Problems:   Diabetic gastroparesis ([place])   ESRD on hemodialysis ([place])   DKA, type 2 ([place])    Discharge Condition: Improved.  Diet recommendation: renal/ Carbohydrate-modified  Wound care: None.  Code status: Full.   History of Present Illness:   35 year old with a history of DM 1, HTN, HLD, and ESRD on hemodialysis who presented to the ED with the acute onset of nausea vomiting and abdominal pain.  She had an almost identical admission 04/20/20.  She admits to not taking her insulin for "2 to 3 days because I been moving and had a lot on my plate."  In the ED she was found to have a markedly elevated blood sugar as well as an elevated beta hydroxybutyrate level.  Bicarb on chemistry panel was not low.  The patient states she did have her usual scheduled dialysis yesterday via her right femoral dialysis catheter.   Hospital Course by Problem:   Severe uncontrolled DM1- early DKA -insulin noncompliance Transitioned from insulin gtt to SQ insulin-- encouraged compliance -possible thrush-- nystatin  -may have OSA-- needs outpateint sleep test  Intractable nausea-severe abdominal pain Suspect diabetic gastroparesis-has previously had CT abdomen/pelvis with no acute findings -resolved  avoid narcotics as much as possible  ESRD on dialysisM/W/F -HD on schedule  Anemia of chronic kidney disease  Renal osteodystrophy Resume usual home medications  History of SVT -no further episodes  HTN Uncontrolled likely due to noncompliance with  medications -resume home meds-- may need to stop  History of failed kidney transplant 2007  Pseudoseizures   Patient states she needs to go home today   Medical Consultants:    renal  Discharge Exam:   Vitals:   08/02/20 0951 08/02/20 1128  BP: 107/84 104/70  Pulse: 99 85  Resp: 16 14  Temp:  98.1 F (36.7 C)  SpO2: 96% 95%   Vitals:   08/02/20 0300 08/02/20 0713 08/02/20 0951 08/02/20 1128  BP: (!) 83/52 119/80 107/84 104/70  Pulse: 94 89 99 85  Resp: '17 13 16 14  '$ Temp: 97.8 F (36.6 C) 98.1 F (36.7 C)  98.1 F (36.7 C)  TempSrc: Oral Oral  Oral  SpO2: 98% 100% 96% 95%  Weight:        General exam: Appears calm and comfortable. .    The results of significant diagnostics from this hospitalization (including imaging, microbiology, ancillary and laboratory) are listed below for reference.     Procedures and Diagnostic Studies:   DG Chest 2 View  Result Date: 07/31/2020 CLINICAL DATA:  35 year old female with sharp chest pain for 2 days, nausea. Dialysis patient. EXAM: CHEST - 2 VIEW COMPARISON:  Chest radiographs 04/20/2020 and earlier. FINDINGS: Mildly larger lung volumes. Stable cardiac size and mediastinal contours. Heart size chronically at the upper limits of normal. Inferior approach dialysis type catheter tip projects at the inferior cavoatrial junction as before. Tracheal air column is within normal limits. No pneumothorax, pulmonary edema, pleural effusion or confluent pulmonary opacity. No osseous abnormality identified. Stable cholecystectomy clips. Negative visible bowel gas pattern. IMPRESSION: No acute cardiopulmonary abnormality. Electronically Signed  By: Genevie [name] M.D.   On: 07/31/2020 11:30     Labs:   Basic Metabolic Panel: Recent Labs  Lab 07/31/20 1744 07/31/20 2255 08/01/20 0209 08/01/20 1013 08/02/20 0944  NA 140 137 139 137 135  K 4.5 4.3 4.2 4.1 4.4  CL 86* 88* 91* 91* 96*  CO2 31 33* 32 30 26  GLUCOSE 267* 162* 106*  143* 150*  BUN 48* 53* 55* 56* 32*  CREATININE 8.37* 8.37* 8.72* 9.20* 7.29*  CALCIUM 8.5* 8.2* 8.1* 7.9* 7.7*  PHOS  --   --   --   --  5.3*   GFR Estimated Creatinine Clearance: 11.3 mL/min (A) (by C-G formula based on SCr of 7.29 mg/dL (H)). Liver Function Tests: Recent Labs  Lab 08/02/20 0944  ALBUMIN 3.5   No results for input(s): LIPASE, AMYLASE in the last 168 hours. No results for input(s): AMMONIA in the last 168 hours. Coagulation profile No results for input(s): INR, PROTIME in the last 168 hours.  CBC: Recent Labs  Lab 07/31/20 1044 07/31/20 1427 08/01/20 1314  WBC 9.1  --  7.7  HGB 12.5 14.3 10.5*  HCT 39.5 42.0 35.1*  MCV 96.1  --  100.6*  PLT 223  --  184   Cardiac Enzymes: No results for input(s): CKTOTAL, CKMB, CKMBINDEX, TROPONINI in the last 168 hours. BNP: Invalid input(s): POCBNP CBG: Recent Labs  Lab 08/01/20 1221 08/01/20 1637 08/01/20 2114 08/02/20 0623 08/02/20 1123  GLUCAP 176* 143* 211* 129* 208*   D-Dimer No results for input(s): DDIMER in the last 72 hours. Hgb A1c Recent Labs    08/01/20 0209  HGBA1C 11.0*   Lipid Profile No results for input(s): CHOL, HDL, LDLCALC, TRIG, CHOLHDL, LDLDIRECT in the last 72 hours. Thyroid function studies No results for input(s): TSH, T4TOTAL, T3FREE, THYROIDAB in the last 72 hours.  Invalid input(s): FREET3 Anemia work up No results for input(s): VITAMINB12, FOLATE, FERRITIN, TIBC, IRON, RETICCTPCT in the last 72 hours. Microbiology Recent Results (from the past 240 hour(s))  Resp Panel by RT-PCR (Flu A&B, Covid) Nasopharyngeal Swab     Status: None   Collection Time: 07/31/20 11:45 AM   Specimen: Nasopharyngeal Swab; Nasopharyngeal(NP) swabs in vial transport medium  Result Value Ref Range Status   SARS Coronavirus 2 by RT PCR NEGATIVE NEGATIVE Final    Comment: (NOTE) SARS-CoV-2 target nucleic acids are NOT DETECTED.  The SARS-CoV-2 RNA is generally detectable in upper  respiratory specimens during the acute phase of infection. The lowest concentration of SARS-CoV-2 viral copies this assay can detect is 138 copies/mL. A negative result does not preclude SARS-Cov-2 infection and should not be used as the sole basis for treatment or other patient management decisions. A negative result may occur with  improper specimen collection/handling, submission of specimen other than nasopharyngeal swab, presence of viral mutation(s) within the areas targeted by this assay, and inadequate number of viral copies(<138 copies/mL). A negative result must be combined with clinical observations, patient history, and epidemiological information. The expected result is Negative.  Fact Sheet for Patients:  EntrepreneurPulse.com.au  Fact Sheet for Healthcare Providers:  IncredibleEmployment.be  This test is no t yet approved or cleared by the [place] FDA and  has been authorized for detection and/or diagnosis of SARS-CoV-2 by FDA under an Emergency Use Authorization (EUA). This EUA will remain  in effect (meaning this test can be used) for the duration of the COVID-19 declaration under Section 564(b)(1) of the Act, 21 U.S.C.section 360bbb-3(b)(1),  unless the authorization is terminated  or revoked sooner.       Influenza A by PCR NEGATIVE NEGATIVE Final   Influenza B by PCR NEGATIVE NEGATIVE Final    Comment: (NOTE) The Xpert Xpress SARS-CoV-2/FLU/RSV plus assay is intended as an aid in the diagnosis of influenza from Nasopharyngeal swab specimens and should not be used as a sole basis for treatment. Nasal washings and aspirates are unacceptable for Xpert Xpress SARS-CoV-2/FLU/RSV testing.  Fact Sheet for Patients: EntrepreneurPulse.com.au  Fact Sheet for Healthcare Providers: IncredibleEmployment.be  This test is not yet approved or cleared by the [place] FDA and has been  authorized for detection and/or diagnosis of SARS-CoV-2 by FDA under an Emergency Use Authorization (EUA). This EUA will remain in effect (meaning this test can be used) for the duration of the COVID-19 declaration under Section 564(b)(1) of the Act, 21 U.S.C. section 360bbb-3(b)(1), unless the authorization is terminated or revoked.  Performed at [facility], [place] [address] [place], [place] [address]   MRSA PCR Screening     Status: None   Collection Time: 07/31/20  4:43 PM   Specimen: Nasal Mucosa; Nasopharyngeal  Result Value Ref Range Status   MRSA by PCR NEGATIVE NEGATIVE Final    Comment:        The GeneXpert MRSA Assay (FDA approved for NASAL specimens only), is one component of a comprehensive MRSA colonization surveillance program. It is not intended to diagnose MRSA infection nor to guide or monitor treatment for MRSA infections. Performed at [facility], [place] [address] [place], [place] [address]      Discharge Instructions:   Discharge Instructions    Discharge instructions   Complete by: As directed    Outpatient sleep study Soft DM/low residue   Increase activity slowly   Complete by: As directed      Allergies as of 08/02/2020      Reactions   Diphenhydramine Anaphylaxis   Doxycycline Shortness Of Breath   Motrin [ibuprofen] Shortness Of Breath, Itching   Per pt   Shellfish Allergy Hives   Banana Itching, Nausea And Vomiting, Other (See Comments)   Sick on the stomach   Chlorhexidine Itching   Ferrous Sulfate Itching   Iron Dextran Itching, Other (See Comments)   Vein irritation      Medication List    STOP taking these medications   cetirizine 10 MG tablet Commonly known as: ZYRTEC   gabapentin 400 MG capsule Commonly known as: NEURONTIN   heparin 1000 unit/mL Soln injection   oxyCODONE-acetaminophen 5-325 MG tablet Commonly known as: Percocet     TAKE these medications   Accu-Chek Softclix Lancets lancets Use to  check blood sugar 4 times per day. What changed:   how much to take  how to take this  when to take this  additional instructions   acetaminophen 500 MG tablet Commonly known as: TYLENOL Take 1,000 mg by mouth every 6 (six) hours as needed for headache (pain).   amitriptyline 10 MG tablet Commonly known as: ELAVIL Take 10 mg by mouth at bedtime.   calcium carbonate (dosed in mg elemental calcium) 1250 MG/5ML Susp Take 5 mLs by mouth 3 (three) times daily.   D 1000 25 MCG (1000 UT) capsule Generic drug: Cholecalciferol Take 1,000 Units by mouth every other day.   fluticasone 50 MCG/ACT nasal spray Commonly known as: FLONASE Place 2 sprays into both nostrils daily as needed for allergies.   hydrOXYzine 50 MG tablet  Commonly known as: ATARAX/VISTARIL Take 1 tablet (50 mg total) by mouth 2 (two) times daily as needed for itching.   insulin aspart 100 UNIT/ML FlexPen Commonly known as: NOVOLOG Inject 2-10 Units into the skin 3 (three) times daily with meals.   insulin degludec 100 UNIT/ML FlexTouch Pen Commonly known as: TRESIBA Inject 20 Units into the skin daily.   loperamide 1 MG/5ML solution Commonly known as: IMODIUM Take 2 mg by mouth 2 (two) times daily as needed for diarrhea or loose stools.   metoCLOPramide 5 MG tablet Commonly known as: REGLAN Take 2 tablets (10 mg total) by mouth 2 (two) times daily. What changed:   medication strength  when to take this   metoprolol tartrate 25 MG tablet Commonly known as: LOPRESSOR Take 1 tablet (25 mg total) by mouth 2 (two) times daily.   nystatin 100000 UNIT/ML suspension Commonly known as: MYCOSTATIN Take 5 mLs (500,000 Units total) by mouth 4 (four) times daily for 6 days.   pantoprazole 40 MG tablet Commonly known as: PROTONIX Take 40 mg by mouth 2 (two) times daily.   promethazine 25 MG tablet Commonly known as: PHENERGAN Take 25 mg by mouth every 6 (six) hours as needed for nausea or vomiting.    simvastatin 20 MG tablet Commonly known as: ZOCOR Take 20 mg by mouth at bedtime.       Follow-up Information    [name], MD Follow up in 1 week(s).   Specialty: Internal Medicine Why: referral for outpatient sleep study Contact information: [place] [address] [phone]        Josue Hector, MD .   Specialty: Cardiology Contact information: [id number] [place] [address] [phone]                Time coordinating discharge: 35 min  Signed:  Geradine Girt DO  [facility] 08/02/2020, 1:30 PM

## 2020-08-02 NOTE — Progress Notes (Addendum)
SATURATION QUALIFICATIONS: (This note is used to comply with regulatory documentation for home oxygen)  Patient Saturations on Room Air at Rest = 95%  Patient Saturations on Room Air while Ambulating = 92%  Pt ambulated in a RA with O2 having on 94's. Pt tolerated walking well without any complaints of chest pain or distress. Palma Holter, RN

## 2020-08-02 NOTE — Plan of Care (Signed)

## 2020-08-02 NOTE — Evaluation (Signed)
Clinical/Bedside Swallow Evaluation Patient Details  Name: [name] MRN: [id number] Date of Birth: 1985-05-19  Today's Date: 08/02/2020 Time: SLP Start Time (ACUTE ONLY): 0759 SLP Stop Time (ACUTE ONLY): 0813 SLP Time Calculation (min) (ACUTE ONLY): 14 min  Past Medical History:  Past Medical History:  Diagnosis Date  . Anemia   . Chronic kidney disease    kidney transplant 07  . Diabetes mellitus    Pt reports diagnosis in June 2011, Type 2  . GERD (gastroesophageal reflux disease)   . Hyperlipidemia   . Hypertension   . 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 Pseudoseizures ([place]) 12/22/2012  . Pyelonephritis 06/23/2014  . Seasonal allergies   . UTI (urinary tract infection) 01/09/2015  . 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 ARTERIOVENOUS GRAFT PLACEMENT Bilateral    "neither work" (10/24/2017)  . AV FISTULA PLACEMENT Left 10/26/2018   Procedure: CREATION OF ARTERIOVENOUS FISTULA  LEFT ARM;  Surgeon: [name], MD;  Location: [facility];  Service: Vascular;  Laterality: Left;  . AV FISTULA PLACEMENT Left 05/23/2020   Procedure: LEFT ARM ARTERIOVENOUS (AV) FISTULA CREATION;  Surgeon: [name], MD;  Location: [facility];  Service: Vascular;  Laterality: Left;  . BASCILIC VEIN TRANSPOSITION Left 12/21/2018   Procedure: Left arm BASILIC VEIN TRANSPOSITION SECOND STAGE;  Surgeon: [name], MD;  Location: [facility];  Service: Vascular;  Laterality: Left;  . CHOLECYSTECTOMY N/A 06/30/2017   Procedure: LAPAROSCOPIC CHOLECYSTECTOMY WITH INTRAOPERATIVE CHOLANGIOGRAM;  Surgeon: Excell Seltzer, MD;  Location: [facility];  Service: General;  Laterality: N/A;  . ESOPHAGOGASTRODUODENOSCOPY (EGD) WITH PROPOFOL N/A 07/04/2017   Procedure:  ESOPHAGOGASTRODUODENOSCOPY (EGD) WITH PROPOFOL;  Surgeon: Clarene Essex, MD;  Location: [facility];  Service: Endoscopy;  Laterality: N/A;  . INSERTION OF DIALYSIS CATHETER N/A 03/20/2018   Procedure: INSERTION OF TUNNELED DIALYSIS CATHETER - RIGHT INTERANL JUGULAR PLACEMENT;  Surgeon: Angelia Mould, MD;  Location: [facility];  Service: Vascular;  Laterality: N/A;  . IR FLUORO GUIDE CV LINE RIGHT  04/18/2020  . IR GUIDED DRAIN W CATHETER PLACEMENT  10/28/2018  . KIDNEY TRANSPLANT  2007  . PARATHYROIDECTOMY  ?2012   "3/4 removed" (10/24/2017)  . RENAL BIOPSY Bilateral 2003  . UPPER EXTREMITY VENOGRAPHY Bilateral 10/19/2018   Procedure: UPPER EXTREMITY VENOGRAPHY;  Surgeon: [name], MD;  Location: [facility];  Service: Cardiovascular;  Laterality: Bilateral;  Bilateral    HPI:  Pt is a 35 yo female presenting with intractable nausea and severe abdominal pain, admitted with severe uncontrolled DM1 and early DKA after not taking insulin for a few days. SLP ordered due to reports of trouble swallowing pills and solids, describing them as coming right up and getting stuck in her throat. PMH also includes: GERD, HTN, HLD, ESRD, XXX syndrome, pseudoseizures, learning disability, s/p kidney transplant   Assessment / Plan / Recommendation Clinical Impression  Pt reports increasing symptoms of reflux this admission, with c/o heartburn after swallowing very little amounts of applesauce. Oropharyngeal swallow appears WFL with liquids but she has oral holding and the appearance of piecemeal swallowing with purees, which could be compensatory in nature given subjective symptoms that also include a sore throat and globus sensation. Recommend a full liquid diet  for now as pt is declining solids. Would crush meds in puree. Suspect that if pt's self-reported symptoms of heartburn and nausea can be alleviated that she will be capable of swallowing safely. Will continue to follow for advancement. SLP  Visit Diagnosis: Dysphagia, unspecified (R13.10)    Aspiration Risk  Mild aspiration risk    Diet Recommendation Thin liquid (full liquid diet)   Liquid Administration via: Cup;Straw Medication Administration: Crushed with puree Supervision: Patient able to self feed;Intermittent supervision to cue for compensatory strategies Compensations: Slow rate;Small sips/bites;Follow solids with liquid Postural Changes: Seated upright at 90 degrees;Remain upright for at least 30 minutes after po intake    Other  Recommendations Recommended Consults: Consider esophageal assessment;Consider GI evaluation Oral Care Recommendations: Oral care BID   Follow up Recommendations None      Frequency and Duration min 2x/week  2 weeks       Prognosis Prognosis for Safe Diet Advancement: Good      Swallow Study   General HPI: Pt is a 35 yo female presenting with intractable nausea and severe abdominal pain, admitted with severe uncontrolled DM1 and early DKA after not taking insulin for a few days. SLP ordered due to reports of trouble swallowing pills and solids, describing them as coming right up and getting stuck in her throat. PMH also includes: GERD, HTN, HLD, ESRD, XXX syndrome, pseudoseizures, learning disability, s/p kidney transplant Type of Study: Bedside Swallow Evaluation Previous Swallow Assessment: none in chart Diet Prior to this Study: NPO Temperature Spikes Noted: No Respiratory Status: Room air History of Recent Intubation: No Behavior/Cognition: Alert;Requires cueing Oral Cavity Assessment: Within Functional Limits Oral Care Completed by SLP: Yes Oral Cavity - Dentition: Adequate natural dentition Vision: Functional for self-feeding Self-Feeding Abilities: Able to feed self Patient Positioning: Upright in bed Baseline Vocal Quality: Normal Volitional Cough: Strong Volitional Swallow: Able to elicit    Oral/Motor/Sensory Function Overall Oral Motor/Sensory Function: Within  functional limits   Ice Chips Ice chips: Not tested   Thin Liquid Thin Liquid: Within functional limits Presentation: Cup;Self Fed;Straw    Nectar Thick Nectar Thick Liquid: Not tested   Honey Thick Honey Thick Liquid: Not tested   Puree Puree: Impaired Presentation: Spoon;Self Fed Oral Phase Functional Implications: Oral holding Pharyngeal Phase Impairments: Multiple swallows   Solid     Solid: Not tested      Osie Bond., M.A. [facility] Pager [phone] Office [phone]  08/02/2020,8:25 AM

## 2020-08-02 NOTE — Progress Notes (Signed)
Walking through unit, noticed pt's SpO2 82%. Pt found asleep, on RA. Woke pt and asked if she wears O2, pt states she only wears at night. [place] placed in pt's nose, pt states she feels like she needs more "air". O2 increased from 2L to 2.5L. Pt states she feels more comfortable. SpO2 currently 97%.

## 2020-08-02 NOTE — Progress Notes (Signed)
[facility] ROUNDING NOTE   Subjective:   Last HD on 5/20 with 171 mL fluid given; no fluid removed.  No labs yet today.  Hypotension overnight.  Improved more recently.  119/80 on my exam.  States that she has heard of midodrine before - they have talked about her needing it.  She states she was moving so she packed up her insulin.  She was given oxygen overnight.  Doesn't wear at home and doesn't think she has any fluid on.   Review of systems: Denies shortness of breath  Denies cp  Not normally on oxygen at home Has been NPO today    Background: 35 year old lady end-stage renal disease Monday Wednesday Friday dialysis.  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  History of diabetes gastroparesis gastroparesis versus hypertension hyperlipidemia.  She presents to the emergency room 07/31/2020 with early DKA insulin noncompliance.  Intractable nausea and severe abdominal pain.  She will continue on dialysis Monday Wednesday Friday.  Her next dialysis treatment 08/01/2020    Objective:  Vital signs in last 24 hours:  Temp:  [97.8 F (36.6 C)-98.8 F (37.1 C)] 98.1 F (36.7 C) (05/21 0713) Pulse Rate:  [86-97] 89 (05/21 0713) Resp:  [11-19] 13 (05/21 0713) BP: (72-119)/(50-80) 119/80 (05/21 0713) SpO2:  [94 %-100 %] 100 % (05/21 0713) Weight:  [78.7 kg-79.1 kg] 78.7 kg (05/20 1558)  Weight change: 1.6 kg Filed Weights   07/31/20 1600 08/01/20 1252 08/01/20 1558  Weight: 77.5 kg 79.1 kg 78.7 kg    Intake/Output: I/O last 3 completed shifts: In: 825.3 [P.O.:240; I.V.:585.3] Out: -171    Intake/Output this shift:  No intake/output data recorded.  General adult female in bed in no acute distress HEENT normocephalic atraumatic extraocular movements intact sclera anicteric Neck supple trachea midline Lungs clear to auscultation bilaterally normal work of breathing at rest  Heart S1S2 no rub Abdomen soft nontender  nondistended Extremities no edema  Psych normal mood and affect Neuro - alert and oriented x 3 provides hx and follows commands Access right thigh tunn catheter and left arm AVF with weak bruit   Basic Metabolic Panel: Recent Labs  Lab 07/31/20 1044 07/31/20 1427 07/31/20 1744 07/31/20 2255 08/01/20 0209 08/01/20 1013  NA 134* 134* 140 137 139 137  K 5.0 5.9* 4.5 4.3 4.2 4.1  CL 86*  --  86* 88* 91* 91*  CO2 25  --  31 33* 32 30  GLUCOSE 480*  --  267* 162* 106* 143*  BUN 39*  --  48* 53* 55* 56*  CREATININE 7.39*  --  8.37* 8.37* 8.72* 9.20*  CALCIUM 8.0*  --  8.5* 8.2* 8.1* 7.9*    Liver Function Tests: No results for input(s): AST, ALT, ALKPHOS, BILITOT, PROT, ALBUMIN in the last 168 hours. No results for input(s): LIPASE, AMYLASE in the last 168 hours. No results for input(s): AMMONIA in the last 168 hours.  CBC: Recent Labs  Lab 07/31/20 1044 07/31/20 1427 08/01/20 1314  WBC 9.1  --  7.7  HGB 12.5 14.3 10.5*  HCT 39.5 42.0 35.1*  MCV 96.1  --  100.6*  PLT 223  --  184    Cardiac Enzymes: No results for input(s): CKTOTAL, CKMB, CKMBINDEX, TROPONINI in the last 168 hours.  BNP: Invalid input(s): POCBNP  CBG: Recent Labs  Lab 08/01/20 1103 08/01/20 1221 08/01/20 1637 08/01/20 2114 08/02/20 0623  GLUCAP 161* 176* 143* 211* 129*  Microbiology: Results for orders placed or performed during the hospital encounter of 07/31/20  Resp Panel by RT-PCR (Flu A&B, Covid) Nasopharyngeal Swab     Status: None   Collection Time: 07/31/20 11:45 AM   Specimen: Nasopharyngeal Swab; Nasopharyngeal(NP) swabs in vial transport medium  Result Value Ref Range Status   SARS Coronavirus 2 by RT PCR NEGATIVE NEGATIVE Final    Comment: (NOTE) SARS-CoV-2 target nucleic acids are NOT DETECTED.  The SARS-CoV-2 RNA is generally detectable in upper respiratory specimens during the acute phase of infection. The lowest concentration of SARS-CoV-2 viral copies this assay can  detect is 138 copies/mL. A negative result does not preclude SARS-Cov-2 infection and should not be used as the sole basis for treatment or other patient management decisions. A negative result may occur with  improper specimen collection/handling, submission of specimen other than nasopharyngeal swab, presence of viral mutation(s) within the areas targeted by this assay, and inadequate number of viral copies(<138 copies/mL). A negative result must be combined with clinical observations, patient history, and epidemiological information. The expected result is Negative.  Fact Sheet for Patients:  EntrepreneurPulse.com.au  Fact Sheet for Healthcare Providers:  IncredibleEmployment.be  This test is no t yet approved or cleared by the [place] FDA and  has been authorized for detection and/or diagnosis of SARS-CoV-2 by FDA under an Emergency Use Authorization (EUA). This EUA will remain  in effect (meaning this test can be used) for the duration of the COVID-19 declaration under Section 564(b)(1) of the Act, 21 U.S.C.section 360bbb-3(b)(1), unless the authorization is terminated  or revoked sooner.       Influenza A by PCR NEGATIVE NEGATIVE Final   Influenza B by PCR NEGATIVE NEGATIVE Final    Comment: (NOTE) The Xpert Xpress SARS-CoV-2/FLU/RSV plus assay is intended as an aid in the diagnosis of influenza from Nasopharyngeal swab specimens and should not be used as a sole basis for treatment. Nasal washings and aspirates are unacceptable for Xpert Xpress SARS-CoV-2/FLU/RSV testing.  Fact Sheet for Patients: EntrepreneurPulse.com.au  Fact Sheet for Healthcare Providers: IncredibleEmployment.be  This test is not yet approved or cleared by the [place] FDA and has been authorized for detection and/or diagnosis of SARS-CoV-2 by FDA under an Emergency Use Authorization (EUA). This EUA will remain in  effect (meaning this test can be used) for the duration of the COVID-19 declaration under Section 564(b)(1) of the Act, 21 U.S.C. section 360bbb-3(b)(1), unless the authorization is terminated or revoked.  Performed at [facility], [place] [address], [place], [place] [address]   MRSA PCR Screening     Status: None   Collection Time: 07/31/20  4:43 PM   Specimen: Nasal Mucosa; Nasopharyngeal  Result Value Ref Range Status   MRSA by PCR NEGATIVE NEGATIVE Final    Comment:        The GeneXpert MRSA Assay (FDA approved for NASAL specimens only), is one component of a comprehensive MRSA colonization surveillance program. It is not intended to diagnose MRSA infection nor to guide or monitor treatment for MRSA infections. Performed at [facility], [place] [address], [place], [place] [address]     Imaging: DG Chest 2 View  Result Date: 07/31/2020 CLINICAL DATA:  35 year old female with sharp chest pain for 2 days, nausea. Dialysis patient. EXAM: CHEST - 2 VIEW COMPARISON:  Chest radiographs 04/20/2020 and earlier. FINDINGS: Mildly larger lung volumes. Stable cardiac size and mediastinal contours. Heart size chronically at the upper limits of normal. Inferior approach  dialysis type catheter tip projects at the inferior cavoatrial junction as before. Tracheal air column is within normal limits. No pneumothorax, pulmonary edema, pleural effusion or confluent pulmonary opacity. No osseous abnormality identified. Stable cholecystectomy clips. Negative visible bowel gas pattern. IMPRESSION: No acute cardiopulmonary abnormality. Electronically Signed   By: Genevie [name] M.D.   On: 07/31/2020 11:30     Medications:    . calcium carbonate (dosed in mg elemental calcium)  500 mg of elemental calcium Oral TID  . Chlorhexidine Gluconate Cloth  6 each Topical Q0600  . cholecalciferol  1,000 Units Oral QODAY  . gabapentin  400 mg Oral TID  . heparin  5,000 Units Subcutaneous Q8H  . insulin  aspart  0-5 Units Subcutaneous QHS  . insulin aspart  0-9 Units Subcutaneous TID WC  . insulin glargine  20 Units Subcutaneous Daily  . LORazepam  1 mg Intravenous Once  . metoprolol tartrate  25 mg Oral BID  . pantoprazole  40 mg Oral BID  . senna  1 tablet Oral BID   acetaminophen **OR** acetaminophen, dextrose, dextrose, ondansetron **OR** ondansetron (ZOFRAN) IV  Assessment/ Plan:  1.  Uncontrolled DMT1/Early DKA - non compliance with insulin.  Appreciate primary team.  Concern as to insight   2. Abdominal pain/n/v - Hx gastroparesis. 1 episode coffee ground emesis.  s/p reglan 3. Hyperkalemia - resolved; await labs 4.  ESRD  1. On HD MWF schedule. Await updated labs 2. Note he was on gabapentin 400 mg TID here.  I have discontinued as she is ESRD and today will allow to clear. Consider resuming at a dose of 300 mg daily max if needed  5.  Hypertension/volume  -  Normotensive last check.  Fluid depleted initially in setting of uncontrolled DM?  May need midodrine.  6.  Anemia of CKD - no indication for ESA 7.  Secondary Hyperparathyroidism - Ca a little low; per charting, on increased Ca bath. Ordered renal panel 5/21 to include phos.  Continue binders and VDRA.  8. Pseudoseizures 9. Hx SVT   Claudia Desanctis 7:33 AM 08/02/2020

## 2020-08-04 ENCOUNTER — Telehealth: Payer: Self-pay | Admitting: Nephrology

## 2020-08-04 NOTE — Telephone Encounter (Signed)
Transition of Care Contact from [place]  Date of Discharge: 08/02/20 Date of Contact: 08/04/20 Method of contact: phone - attempted  Attempted to contact patient to discuss transition of care from inpatient admission.  Patient did not answer the phone.  Message was left on patient's voicemail informing them we would attempt to call them again and if unable to reach will follow up at dialysis.  Jen Mow, PA-C [facility] Pager: [phone]

## 2020-08-09 ENCOUNTER — Other Ambulatory Visit: Payer: Self-pay

## 2020-08-09 ENCOUNTER — Encounter (HOSPITAL_BASED_OUTPATIENT_CLINIC_OR_DEPARTMENT_OTHER): Payer: Self-pay

## 2020-08-09 ENCOUNTER — Observation Stay (HOSPITAL_BASED_OUTPATIENT_CLINIC_OR_DEPARTMENT_OTHER)
Admission: EM | Admit: 2020-08-09 | Discharge: 2020-08-10 | Disposition: A | Discharge status: Home / Self Care | Payer: Medicare Other | Attending: Internal Medicine | Admitting: Internal Medicine

## 2020-08-09 ENCOUNTER — Emergency Department (HOSPITAL_BASED_OUTPATIENT_CLINIC_OR_DEPARTMENT_OTHER): Payer: Medicare Other

## 2020-08-09 DIAGNOSIS — D631 Anemia in chronic kidney disease: Secondary | ICD-10-CM

## 2020-08-09 DIAGNOSIS — E1022 Type 1 diabetes mellitus with diabetic chronic kidney disease: Secondary | ICD-10-CM | POA: Insufficient documentation

## 2020-08-09 DIAGNOSIS — K3189 Other diseases of stomach and duodenum: Secondary | ICD-10-CM | POA: Diagnosis not present

## 2020-08-09 DIAGNOSIS — I120 Hypertensive chronic kidney disease with stage 5 chronic kidney disease or end stage renal disease: Secondary | ICD-10-CM | POA: Insufficient documentation

## 2020-08-09 DIAGNOSIS — Z794 Long term (current) use of insulin: Secondary | ICD-10-CM | POA: Insufficient documentation

## 2020-08-09 DIAGNOSIS — N189 Chronic kidney disease, unspecified: Secondary | ICD-10-CM | POA: Diagnosis present

## 2020-08-09 DIAGNOSIS — E109 Type 1 diabetes mellitus without complications: Secondary | ICD-10-CM | POA: Diagnosis present

## 2020-08-09 DIAGNOSIS — Z992 Dependence on renal dialysis: Secondary | ICD-10-CM | POA: Diagnosis not present

## 2020-08-09 DIAGNOSIS — K920 Hematemesis: Secondary | ICD-10-CM | POA: Diagnosis present

## 2020-08-09 DIAGNOSIS — N186 End stage renal disease: Secondary | ICD-10-CM | POA: Diagnosis not present

## 2020-08-09 DIAGNOSIS — K209 Esophagitis, unspecified without bleeding: Secondary | ICD-10-CM

## 2020-08-09 DIAGNOSIS — K2100 Gastro-esophageal reflux disease with esophagitis, without bleeding: Secondary | ICD-10-CM | POA: Diagnosis not present

## 2020-08-09 DIAGNOSIS — K208 Other esophagitis without bleeding: Secondary | ICD-10-CM

## 2020-08-09 DIAGNOSIS — K922 Gastrointestinal hemorrhage, unspecified: Secondary | ICD-10-CM | POA: Diagnosis present

## 2020-08-09 DIAGNOSIS — E1065 Type 1 diabetes mellitus with hyperglycemia: Secondary | ICD-10-CM

## 2020-08-09 DIAGNOSIS — E108 Type 1 diabetes mellitus with unspecified complications: Secondary | ICD-10-CM

## 2020-08-09 DIAGNOSIS — Z79899 Other long term (current) drug therapy: Secondary | ICD-10-CM | POA: Diagnosis not present

## 2020-08-09 DIAGNOSIS — IMO0002 Reserved for concepts with insufficient information to code with codable children: Secondary | ICD-10-CM | POA: Diagnosis present

## 2020-08-09 DIAGNOSIS — R112 Nausea with vomiting, unspecified: Secondary | ICD-10-CM

## 2020-08-09 DIAGNOSIS — Z20822 Contact with and (suspected) exposure to covid-19: Secondary | ICD-10-CM | POA: Insufficient documentation

## 2020-08-09 DIAGNOSIS — E1110 Type 2 diabetes mellitus with ketoacidosis without coma: Secondary | ICD-10-CM

## 2020-08-09 DIAGNOSIS — I10 Essential (primary) hypertension: Secondary | ICD-10-CM | POA: Diagnosis present

## 2020-08-09 LAB — COMPREHENSIVE METABOLIC PANEL
ALT: 9 U/L (ref 0–44)
AST: 13 U/L — ABNORMAL LOW (ref 15–41)
Albumin: 3.8 g/dL (ref 3.5–5.0)
Alkaline Phosphatase: 50 U/L (ref 38–126)
Anion gap: 15 (ref 5–15)
BUN: 41 mg/dL — ABNORMAL HIGH (ref 6–20)
CO2: 29 mmol/L (ref 22–32)
Calcium: 8 mg/dL — ABNORMAL LOW (ref 8.9–10.3)
Chloride: 93 mmol/L — ABNORMAL LOW (ref 98–111)
Creatinine, Ser: 5.91 mg/dL — ABNORMAL HIGH (ref 0.44–1.00)
GFR, Estimated: 9 mL/min — ABNORMAL LOW (ref >60)
Glucose, Bld: 247 mg/dL — ABNORMAL HIGH (ref 70–99)
Potassium: 4.3 mmol/L (ref 3.5–5.1)
Sodium: 137 mmol/L (ref 135–145)
Total Bilirubin: 0.3 mg/dL (ref 0.3–1.2)
Total Protein: 7.4 g/dL (ref 6.5–8.1)

## 2020-08-09 LAB — I-STAT VENOUS BLOOD GAS, ED
Acid-Base Excess: 11 mmol/L — ABNORMAL HIGH (ref 0.0–2.0)
Bicarbonate: 35 mmol/L — ABNORMAL HIGH (ref 20.0–28.0)
Calcium, Ion: 1.02 mmol/L — ABNORMAL LOW (ref 1.15–1.40)
HCT: 31 % — ABNORMAL LOW (ref 36.0–46.0)
Hemoglobin: 10.5 g/dL — ABNORMAL LOW (ref 12.0–15.0)
O2 Saturation: 94 %
Patient temperature: 98.2
Potassium: 4.3 mmol/L (ref 3.5–5.1)
Sodium: 134 mmol/L — ABNORMAL LOW (ref 135–145)
TCO2: 36 mmol/L — ABNORMAL HIGH (ref 22–32)
pCO2, Ven: 43.5 mmHg — ABNORMAL LOW (ref 44.0–60.0)
pH, Ven: 7.513 — ABNORMAL HIGH (ref 7.250–7.430)
pO2, Ven: 63 mmHg — ABNORMAL HIGH (ref 32.0–45.0)

## 2020-08-09 LAB — CBC WITH DIFFERENTIAL/PLATELET
Abs Immature Granulocytes: 0.04 10*3/uL (ref 0.00–0.07)
Basophils Absolute: 0.1 10*3/uL (ref 0.0–0.1)
Basophils Relative: 1 %
Eosinophils Absolute: 0.1 10*3/uL (ref 0.0–0.5)
Eosinophils Relative: 1 %
HCT: 31.1 % — ABNORMAL LOW (ref 36.0–46.0)
Hemoglobin: 9.6 g/dL — ABNORMAL LOW (ref 12.0–15.0)
Immature Granulocytes: 1 %
Lymphocytes Relative: 22 %
Lymphs Abs: 1.6 10*3/uL (ref 0.7–4.0)
MCH: 30.2 pg (ref 26.0–34.0)
MCHC: 30.9 g/dL (ref 30.0–36.0)
MCV: 97.8 fL (ref 80.0–100.0)
Monocytes Absolute: 0.7 10*3/uL (ref 0.1–1.0)
Monocytes Relative: 10 %
Neutro Abs: 4.7 10*3/uL (ref 1.7–7.7)
Neutrophils Relative %: 65 %
Platelets: 273 10*3/uL (ref 150–400)
RBC: 3.18 MIL/uL — ABNORMAL LOW (ref 3.87–5.11)
RDW: 16.8 % — ABNORMAL HIGH (ref 11.5–15.5)
WBC: 7.3 10*3/uL (ref 4.0–10.5)
nRBC: 0 % (ref 0.0–0.2)

## 2020-08-09 LAB — OCCULT BLOOD X 1 CARD TO LAB, STOOL: Fecal Occult Bld: POSITIVE — AB

## 2020-08-09 LAB — RESP PANEL BY RT-PCR (FLU A&B, COVID) ARPGX2
Influenza A by PCR: NEGATIVE
Influenza B by PCR: NEGATIVE
SARS Coronavirus 2 by RT PCR: NEGATIVE

## 2020-08-09 LAB — BETA-HYDROXYBUTYRIC ACID
Beta-Hydroxybutyric Acid: 1.09 mmol/L — ABNORMAL HIGH (ref 0.05–0.27)
Beta-Hydroxybutyric Acid: 0.7 mmol/L — ABNORMAL HIGH (ref 0.05–0.27)

## 2020-08-09 LAB — HEMOGLOBIN: Hemoglobin: 8.4 g/dL — ABNORMAL LOW (ref 12.0–15.0)

## 2020-08-09 LAB — CBG MONITORING, ED: Glucose-Capillary: 251 mg/dL — ABNORMAL HIGH (ref 70–99)

## 2020-08-09 LAB — GLUCOSE, CAPILLARY
Glucose-Capillary: 231 mg/dL — ABNORMAL HIGH (ref 70–99)
Glucose-Capillary: 193 mg/dL — ABNORMAL HIGH (ref 70–99)

## 2020-08-09 LAB — HCG, QUANTITATIVE, PREGNANCY: hCG, Beta Chain, Quant, S: 5 m[IU]/mL — ABNORMAL HIGH (ref <5)

## 2020-08-09 MED ORDER — ALBUTEROL SULFATE (2.5 MG/3ML) 0.083% IN NEBU: 2.5000 mg | INHALATION_SOLUTION | Freq: Four times a day (QID) | RESPIRATORY_TRACT | Status: DC | PRN

## 2020-08-09 MED ORDER — ACETAMINOPHEN 325 MG PO TABS: 650.0000 mg | ORAL_TABLET | Freq: Four times a day (QID) | ORAL | Status: DC | PRN

## 2020-08-09 MED ORDER — FENTANYL CITRATE (PF) 100 MCG/2ML IJ SOLN
50.0000 ug | Freq: Once | INTRAMUSCULAR | Status: AC
  Administered 2020-08-09: 50 ug via INTRAVENOUS
  Filled 2020-08-09: qty 2

## 2020-08-09 MED ORDER — OXYCODONE HCL 5 MG PO TABS: 5.0000 mg | ORAL_TABLET | ORAL | Status: DC | PRN

## 2020-08-09 MED ORDER — INSULIN ASPART 100 UNIT/ML IJ SOLN
0.0000 [IU] | Freq: Three times a day (TID) | INTRAMUSCULAR | Status: DC
  Administered 2020-08-10 (×2): 3 [IU] via SUBCUTANEOUS

## 2020-08-09 MED ORDER — ONDANSETRON HCL 4 MG/2ML IJ SOLN
4.0000 mg | Freq: Once | INTRAMUSCULAR | Status: AC
  Administered 2020-08-09: 4 mg via INTRAVENOUS
  Filled 2020-08-09: qty 2

## 2020-08-09 MED ORDER — SODIUM CHLORIDE 0.9 % IV BOLUS
1000.0000 mL | Freq: Once | INTRAVENOUS | Status: AC
  Administered 2020-08-10: 1000 mL via INTRAVENOUS

## 2020-08-09 MED ORDER — INSULIN GLARGINE 100 UNIT/ML ~~LOC~~ SOLN
10.0000 [IU] | Freq: Every day | SUBCUTANEOUS | Status: DC
  Administered 2020-08-09: 10 [IU] via SUBCUTANEOUS
  Filled 2020-08-09 (×2): qty 0.1

## 2020-08-09 MED ORDER — PROMETHAZINE HCL 25 MG PO TABS
25.0000 mg | ORAL_TABLET | Freq: Four times a day (QID) | ORAL | Status: DC | PRN
  Filled 2020-08-09: qty 1

## 2020-08-09 MED ORDER — PANTOPRAZOLE SODIUM 40 MG IV SOLR
INTRAVENOUS | Status: AC
  Filled 2020-08-09: qty 80

## 2020-08-09 MED ORDER — ONDANSETRON HCL 4 MG/2ML IJ SOLN: 4.0000 mg | Freq: Four times a day (QID) | INTRAMUSCULAR | Status: DC | PRN

## 2020-08-09 MED ORDER — ONDANSETRON HCL 4 MG PO TABS: 4.0000 mg | ORAL_TABLET | Freq: Four times a day (QID) | ORAL | Status: DC | PRN

## 2020-08-09 MED ORDER — SODIUM CHLORIDE 0.9 % IV SOLN
80.0000 mg | Freq: Two times a day (BID) | INTRAVENOUS | Status: DC
  Filled 2020-08-09: qty 80

## 2020-08-09 MED ORDER — ACETAMINOPHEN 650 MG RE SUPP: 650.0000 mg | Freq: Four times a day (QID) | RECTAL | Status: DC | PRN

## 2020-08-09 MED ORDER — INSULIN ASPART 100 UNIT/ML IJ SOLN: 0.0000 [IU] | Freq: Every day | INTRAMUSCULAR | Status: DC

## 2020-08-09 MED ORDER — HALOPERIDOL LACTATE 5 MG/ML IJ SOLN
3.0000 mg | Freq: Once | INTRAMUSCULAR | Status: AC
  Administered 2020-08-09: 3 mg via INTRAVENOUS
  Filled 2020-08-09: qty 1

## 2020-08-09 MED ORDER — SODIUM CHLORIDE 0.9 % IV SOLN
80.0000 mg | Freq: Once | INTRAVENOUS | Status: AC
  Administered 2020-08-09: 80 mg via INTRAVENOUS
  Filled 2020-08-09: qty 80

## 2020-08-09 MED ORDER — PANTOPRAZOLE SODIUM 40 MG IV SOLR
40.0000 mg | Freq: Two times a day (BID) | INTRAVENOUS | Status: DC
  Administered 2020-08-10 (×2): 40 mg via INTRAVENOUS
  Filled 2020-08-09: qty 40

## 2020-08-09 MED ORDER — SODIUM CHLORIDE 0.9 % IV BOLUS
250.0000 mL | Freq: Once | INTRAVENOUS | Status: AC
  Administered 2020-08-09: 250 mL via INTRAVENOUS

## 2020-08-09 MED ORDER — INSULIN DEGLUDEC 100 UNIT/ML ~~LOC~~ SOPN: 10.0000 [IU] | PEN_INJECTOR | Freq: Every day | SUBCUTANEOUS | Status: DC

## 2020-08-09 NOTE — Consult Note (Signed)
[facility] Renal Consultation Note  Requesting MD: Atha Starks, DO Indication for Consultation:  ESRD  Chief complaint: "threw up blood"   HPI:  [name] is a 35 y.o. female with a history of end-stage renal disease on hemodialysis Monday Wednesday Friday at [facility] kidney center, failed renal transplant, diabetes with neuropathy, gastroparesis, hypertension, XXX syndrome and pseudoseizures who presented to the ER with multiple episodes of hematemesis.  States this hasn't happened before and that she has never had an EGD.  Had upper endoscopy 2019 per [facility] system for indication of hematemesis with moderate reflux esophagitis noted.  She states that Note recent admission with abd pain/n/v and hx of uncontrolled hyperglycemia - she had moved and stated that she had packed up her insulin; she was discharged on 5/21.  I can see charting that she went to HD on 5/25 and she tells me she went on 5/27 as well (though charting either not available yet or she didn't go).     PMHx:   Past Medical History:  Diagnosis Date  . Anemia   . Chronic kidney disease    kidney transplant 07  . Diabetes mellitus    Pt reports diagnosis in June 2011, Type 2  . GERD (gastroesophageal reflux disease)   . Hyperlipidemia   . Hypertension   . 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 Pseudoseizures ([place]) 12/22/2012  . Pyelonephritis 06/23/2014  . Seasonal allergies   . UTI (urinary tract infection) 01/09/2015  . 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 ARTERIOVENOUS GRAFT PLACEMENT Bilateral    "neither work" (10/24/2017)  . AV FISTULA PLACEMENT Left 10/26/2018   Procedure: CREATION OF ARTERIOVENOUS FISTULA  LEFT ARM;  Surgeon: [name], MD;  Location: [facility];  Service: Vascular;   Laterality: Left;  . AV FISTULA PLACEMENT Left 05/23/2020   Procedure: LEFT ARM ARTERIOVENOUS (AV) FISTULA CREATION;  Surgeon: [name], MD;  Location: [facility];  Service: Vascular;  Laterality: Left;  . BASCILIC VEIN TRANSPOSITION Left 12/21/2018   Procedure: Left arm BASILIC VEIN TRANSPOSITION SECOND STAGE;  Surgeon: [name], MD;  Location: [facility];  Service: Vascular;  Laterality: Left;  . CHOLECYSTECTOMY N/A 06/30/2017   Procedure: LAPAROSCOPIC CHOLECYSTECTOMY WITH INTRAOPERATIVE CHOLANGIOGRAM;  Surgeon: Excell Seltzer, MD;  Location: [facility];  Service: General;  Laterality: N/A;  . ESOPHAGOGASTRODUODENOSCOPY (EGD) WITH PROPOFOL N/A 07/04/2017   Procedure: ESOPHAGOGASTRODUODENOSCOPY (EGD) WITH PROPOFOL;  Surgeon: Clarene Essex, MD;  Location: [facility];  Service: Endoscopy;  Laterality: N/A;  . INSERTION OF DIALYSIS CATHETER N/A 03/20/2018   Procedure: INSERTION OF TUNNELED DIALYSIS CATHETER - RIGHT INTERANL JUGULAR PLACEMENT;  Surgeon: Angelia Mould, MD;  Location: [facility];  Service: Vascular;  Laterality: N/A;  . IR FLUORO GUIDE CV LINE RIGHT  04/18/2020  . IR GUIDED DRAIN W CATHETER PLACEMENT  10/28/2018  . KIDNEY TRANSPLANT  2007  . PARATHYROIDECTOMY  ?2012   "3/4 removed" (10/24/2017)  . RENAL BIOPSY Bilateral 2003  . UPPER EXTREMITY VENOGRAPHY Bilateral 10/19/2018   Procedure: UPPER EXTREMITY VENOGRAPHY;  Surgeon: [name], MD;  Location: [facility];  Service: Cardiovascular;  Laterality: Bilateral;  Bilateral     Family Hx:  Family History  Problem Relation Age of Onset  .  Arthritis Mother   . Hypertension Mother   . Aneurysm Mother        died of brain aneurysm  . CAD Father        Has 3 stents  . Diabetes Father        borderline  . Early death Brother        Died in war    Social History:  reports that she has never smoked. She has never used smokeless tobacco. She reports that she does not drink alcohol and does not use  drugs.  Allergies:  Allergies  Allergen Reactions  . Diphenhydramine Anaphylaxis  . Doxycycline Shortness Of Breath  . Motrin [Ibuprofen] Shortness Of Breath and Itching  . Shellfish Allergy Hives  . Banana Itching, Nausea And Vomiting and Other (See Comments)    Sick on the stomach  . Chlorhexidine Itching  . Ferrous Sulfate Itching  . Iron Dextran Itching and Other (See Comments)    Vein irritation     Medications: Prior to Admission medications   Medication Sig Start Date End Date Taking? Authorizing Provider  ACCU-CHEK SOFTCLIX LANCETS lancets Use to check blood sugar 4 times per day. Patient taking differently: 1 each by Other route in the morning, at noon, in the evening, and at bedtime. 12/29/15   Renato Shin, MD  acetaminophen (TYLENOL) 500 MG tablet Take 1,000 mg by mouth every 6 (six) hours as needed for headache (pain).     [provider]  amitriptyline (ELAVIL) 10 MG tablet Take 10 mg by mouth at bedtime. 05/01/20   [provider]  Calcium Carbonate Antacid (CALCIUM CARBONATE, DOSED IN MG ELEMENTAL CALCIUM,) 1250 MG/5ML SUSP Take 5 mLs by mouth 3 (three) times daily.  11/17/18   [provider]  D 1000 25 MCG (1000 UT) capsule Take 1,000 Units by mouth every other day.  04/27/19   [provider]  fluticasone (FLONASE) 50 MCG/ACT nasal spray Place 2 sprays into both nostrils daily as needed for allergies. 09/03/18   [name], MD  hydrOXYzine (ATARAX/VISTARIL) 50 MG tablet Take 1 tablet (50 mg total) by mouth 2 (two) times daily as needed for itching. Patient taking differently: Take 50 mg by mouth 2 (two) times daily as needed for itching. 02/06/18   [name], Ripudeep K, MD  insulin aspart (NOVOLOG) 100 UNIT/ML FlexPen Inject 2-10 Units into the skin 3 (three) times daily with meals.    [provider]  insulin degludec (TRESIBA) 100 UNIT/ML FlexTouch Pen Inject 20 Units into the skin daily. 02/15/20   Dwyane Dee, MD   loperamide (IMODIUM) 1 MG/5ML solution Take 2 mg by mouth 2 (two) times daily as needed for diarrhea or loose stools.     [provider]  metoCLOPramide (REGLAN) 5 MG tablet Take 2 tablets (10 mg total) by mouth 2 (two) times daily. 08/02/20   Geradine Girt, DO  metoprolol tartrate (LOPRESSOR) 25 MG tablet Take 1 tablet (25 mg total) by mouth 2 (two) times daily. 02/15/20   Dwyane Dee, MD  pantoprazole (PROTONIX) 40 MG tablet Take 40 mg by mouth 2 (two) times daily.     [provider]  promethazine (PHENERGAN) 25 MG tablet Take 25 mg by mouth every 6 (six) hours as needed for nausea or vomiting.  10/13/19   [provider]  simvastatin (ZOCOR) 20 MG tablet Take 20 mg by mouth at bedtime.     [provider]    I have reviewed the patient's current  and reported prior to admission medications.  Labs:  BMP Latest Ref Rng & Units 08/09/2020 08/09/2020 08/02/2020  Glucose 70 - 99 mg/dL - 247(H) 150(H)  BUN 6 - 20 mg/dL - 41(H) 32(H)  Creatinine 0.44 - 1.00 mg/dL - 5.91(H) 7.29(H)  BUN/Creat Ratio 6 - 22 (calc) - - -  Sodium 135 - 145 mmol/L 134(L) 137 135  Potassium 3.5 - 5.1 mmol/L 4.3 4.3 4.4  Chloride 98 - 111 mmol/L - 93(L) 96(L)  CO2 22 - 32 mmol/L - 29 26  Calcium 8.9 - 10.3 mg/dL - 8.0(L) 7.7(L)    ROS:  Pertinent items noted in HPI and remainder of comprehensive ROS otherwise negative.  Physical Exam: Vitals:   08/09/20 1445 08/09/20 1540  BP: (!) 108/96 (!) 127/98  Pulse: (!) 122 (!) 114  Resp: 20 14  Temp: 98.1 F (36.7 C) 98.7 F (37.1 C)  SpO2: 100% 99%     General: adult female in bed in NAD HEENT: NCAT Eyes: EOMI sclera anicteric Neck: supple trachea midline Heart: S1S2 no rub Lungs: clear and unlabored Abdomen: soft/nd/slightly tender Extremities: no edema no cyanosis or clubbing Skin: no rash on extremities exposed Neuro: sleepy initially then wakes and answers questions; when awake is alert and oriented x 3 and  conversant Access: right femoral catheter in place  Outpatient HD orders:  MWF [facility] 4 hours 2K/3.5 Ca bath  BF 350 DF 500  EDW 79.5 kg Tunneled catheter Meds:  venofer 100 mg weekly; mircera 100 mcg every 2 weeks; last got mircera 150 mcg on 08/04/20.  08/06/20 Hb 9.2.  No senispar or activated vit D ordered in-center Last tx that is charted is on 5/25 with post weight of 80.7 kg; she states went 5/27 as well   Assessment/Plan:  # ESRD  - HD per MWF schedule  - Next tx on 5/30 per MWF schedule  # GI bleed- hematemesis - GI is consulted    # HTN  - Note last admission there was discussion of possible midodrine.  Pressures appear better at this time  # Anemia CKD as well as acute blood loss - note recently dosed ESA outpatient - mircera 150 mcg on 08/04/20 as above  # Secondary hyperparathyroidism - not on activated vit D outpatient; PTH low   Claudia Desanctis 08/09/2020, 7:25 PM

## 2020-08-09 NOTE — H&P (Signed)
History and Physical  Patient Name: [name]     [id number]    DOB: [birth year]    DOA: 08/09/2020 PCP: [name], MD  Patient coming from: home  Chief Complaint: Hematemesis    HPI: [name] is a 35 y.o. female, with PMH of type 1 diabetes, hypertension, dyslipidemia, ESRD on iHD MWF, who presented to the ER on 08/09/2020 with hematemesis, nausea and vomiting.  At the time of my exam, patient is somnolent, suspect related to Haldol she received in the ER, thus unable to obtain complete and thorough HPI and review of systems.  Information obtained from chart only.  Apparently patient had 4 episodes of hematemesis since yesterday.  The first episode was bloody, dark red and has continued each time.  She continues to have ongoing abdominal pain ever since she was discharged from the hospital on 08/02/2020.  She was hospitalized for poorly controlled diabetes and abdominal pain.  Pain continues to worsen.  No history of GI bleeding in the past.  Does not appear to be on any antiplatelet or anticoagulation.  She is followed by [facility] for gastroparesis and abdominal pain.  She denied any dyspnea, cough, diarrhea, or constipation.  Apparently her last dialysis session was yesterday.  She was first seen at [facility] and the hospitalist service at [facility] was contacted for admission.   ED course: -Vitals on admission: Afebrile, heart rate 122, respiratory rate 19, blood pressure 130/101, maintaining sats on room air -Labs on initial presentation: Sodium 137, potassium 4.3, chloride 93, bicarb 29, glucose 247, BUN 41, creatinine 5.9, hemoglobin 9.6 platelets 273, WBC 7.3, stool occult positive -Imaging obtained on admission: CT on admission demonstrated findings concerning for esophagitis -In the ED the patient was given Haldol, fentanyl, Zofran, Protonix, NS 250 mL.  GI contacted by ER recommended transfer to [facility].  And the hospitalist service was contacted for further  evaluation and management.     ROS: Unable to obtain due to patient status    Past Medical History:  Diagnosis Date  . Anemia   . Chronic kidney disease    kidney transplant 07  . Diabetes mellitus    Pt reports diagnosis in June 2011, Type 2  . GERD (gastroesophageal reflux disease)   . Hyperlipidemia   . Hypertension   . 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 Pyelonephritis 06/23/2014  . Seasonal allergies   . UTI (urinary tract infection) 01/09/2015  . 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 ARTERIOVENOUS GRAFT PLACEMENT Bilateral    "neither work" (10/24/2017)  . AV FISTULA PLACEMENT Left 10/26/2018   Procedure: CREATION OF ARTERIOVENOUS FISTULA  LEFT ARM;  Surgeon: [name], MD;  Location: [place];  Service: Vascular;  Laterality: Left;  . AV FISTULA PLACEMENT Left 05/23/2020   Procedure: LEFT ARM ARTERIOVENOUS (AV) FISTULA CREATION;  Surgeon: [name], MD;  Location: [place];  Service: Vascular;  Laterality: Left;  . BASCILIC VEIN TRANSPOSITION Left 12/21/2018   Procedure: Left arm BASILIC VEIN TRANSPOSITION SECOND STAGE;  Surgeon: [name], MD;  Location: [place];  Service: Vascular;  Laterality: Left;  . CHOLECYSTECTOMY N/A 06/30/2017   Procedure: LAPAROSCOPIC CHOLECYSTECTOMY WITH INTRAOPERATIVE CHOLANGIOGRAM;  Surgeon:  Excell Seltzer, MD;  Location: [facility];  Service: General;  Laterality: N/A;  . ESOPHAGOGASTRODUODENOSCOPY (EGD) WITH PROPOFOL N/A 07/04/2017   Procedure: ESOPHAGOGASTRODUODENOSCOPY (EGD) WITH PROPOFOL;  Surgeon: Clarene Essex, MD;  Location: [facility];  Service: Endoscopy;  Laterality: N/A;  . INSERTION OF DIALYSIS CATHETER N/A 03/20/2018   Procedure: INSERTION OF TUNNELED DIALYSIS CATHETER - RIGHT  INTERANL JUGULAR PLACEMENT;  Surgeon: Angelia Mould, MD;  Location: [place];  Service: Vascular;  Laterality: N/A;  . IR FLUORO GUIDE CV LINE RIGHT  04/18/2020  . IR GUIDED DRAIN W CATHETER PLACEMENT  10/28/2018  . KIDNEY TRANSPLANT  2007  . PARATHYROIDECTOMY  ?2012   "3/4 removed" (10/24/2017)  . RENAL BIOPSY Bilateral 2003  . UPPER EXTREMITY VENOGRAPHY Bilateral 10/19/2018   Procedure: UPPER EXTREMITY VENOGRAPHY;  Surgeon: [name], MD;  Location: [facility];  Service: Cardiovascular;  Laterality: Bilateral;  Bilateral     Social History: Patient lives at home.  The patient walks without assistance.  Non smoker.  Allergies  Allergen Reactions  . Diphenhydramine Anaphylaxis  . Doxycycline Shortness Of Breath  . Motrin [Ibuprofen] Shortness Of Breath and Itching    Per pt  . Shellfish Allergy Hives  . Banana Itching, Nausea And Vomiting and Other (See Comments)    Sick on the stomach  . Chlorhexidine Itching  . Ferrous Sulfate Itching  . Iron Dextran Itching and Other (See Comments)    Vein irritation     Family history: family history includes Aneurysm in her mother; Arthritis in her mother; CAD in her father; Diabetes in her father; Early death in her brother; Hypertension in her mother.  Prior to Admission medications   Medication Sig Start Date End Date Taking? Authorizing Provider  ACCU-CHEK SOFTCLIX LANCETS lancets Use to check blood sugar 4 times per day. Patient taking differently: 1 each by Other route in the morning, at noon, in the evening, and at bedtime. 12/29/15   Renato Shin, MD  acetaminophen (TYLENOL) 500 MG tablet Take 1,000 mg by mouth every 6 (six) hours as needed for headache (pain).     [provider]  amitriptyline (ELAVIL) 10 MG tablet Take 10 mg by mouth at bedtime. 05/01/20   [provider]  Calcium Carbonate Antacid (CALCIUM CARBONATE, DOSED IN MG ELEMENTAL CALCIUM,) 1250 MG/5ML SUSP Take 5 mLs by mouth 3  (three) times daily.  11/17/18   [provider]  D 1000 25 MCG (1000 UT) capsule Take 1,000 Units by mouth every other day.  04/27/19   [provider]  fluticasone (FLONASE) 50 MCG/ACT nasal spray Place 2 sprays into both nostrils daily as needed for allergies. 09/03/18   [name], MD  hydrOXYzine (ATARAX/VISTARIL) 50 MG tablet Take 1 tablet (50 mg total) by mouth 2 (two) times daily as needed for itching. Patient taking differently: Take 50 mg by mouth 2 (two) times daily as needed for itching. 02/06/18   [name], Ripudeep K, MD  insulin aspart (NOVOLOG) 100 UNIT/ML FlexPen Inject 2-10 Units into the skin 3 (three) times daily with meals.    [provider]  insulin degludec (TRESIBA) 100 UNIT/ML FlexTouch Pen Inject 20 Units into the skin daily. 02/15/20   Dwyane Dee, MD  loperamide (IMODIUM) 1 MG/5ML solution Take 2 mg by mouth 2 (two) times daily as needed for diarrhea or loose stools.     [provider]  metoCLOPramide (REGLAN) 5 MG tablet Take 2 tablets (10 mg total) by mouth 2 (two) times  daily. 08/02/20   Geradine Girt, DO  metoprolol tartrate (LOPRESSOR) 25 MG tablet Take 1 tablet (25 mg total) by mouth 2 (two) times daily. 02/15/20   Dwyane Dee, MD  pantoprazole (PROTONIX) 40 MG tablet Take 40 mg by mouth 2 (two) times daily.     [provider]  promethazine (PHENERGAN) 25 MG tablet Take 25 mg by mouth every 6 (six) hours as needed for nausea or vomiting.  10/13/19   [provider]  simvastatin (ZOCOR) 20 MG tablet Take 20 mg by mouth at bedtime.     [provider]       Physical Exam: BP (!) 127/98 (BP Location: Right Arm)   Pulse (!) 114   Temp 98.7 F (37.1 C) (Oral)   Resp 14   SpO2 99%   General appearance: Well-developed, adult female, lethargic Eyes: Anicteric, conjunctiva pink, lids and lashes normal. PERRL.    ENT: No nasal deformity, discharge, epistaxis.  Hearing intact. OP moist without lesions.    Neck: No neck masses.  Trachea midline.  No thyromegaly/tenderness. Lymph: No cervical or supraclavicular lymphadenopathy. Skin: Warm and dry.  No jaundice.  No suspicious rashes or lesions. Cardiac:  Tachycardic, nl S1-S2, no murmurs appreciated.  trace LE edema.  Radial and pedal pulses 2+ and symmetric. Respiratory: Normal respiratory rate and rhythm.  CTAB without rales or wheezes. Abdomen: Abdomen soft.  No tenderness with palpation. No ascites, distension, hepatosplenomegaly.   MSK: No deformities or effusions of the large joints of the upper or lower extremities bilaterally.  No cyanosis or clubbing. Neuro:  Unable to fully assess due to patient's mentation   Psych:  Lethargic    Labs on Admission:  I have personally reviewed following labs and imaging studies: CBC: Recent Labs  Lab 08/09/20 1135 08/09/20 1218  WBC 7.3  --   NEUTROABS 4.7  --   HGB 9.6* 10.5*  HCT 31.1* 31.0*  MCV 97.8  --   PLT 273  --    Basic Metabolic Panel: Recent Labs  Lab 08/09/20 1135 08/09/20 1218  NA 137 134*  K 4.3 4.3  CL 93*  --   CO2 29  --   GLUCOSE 247*  --   BUN 41*  --   CREATININE 5.91*  --   CALCIUM 8.0*  --    GFR: Estimated Creatinine Clearance: 13.9 mL/min (A) (by C-G formula based on SCr of 5.91 mg/dL (H)).  Liver Function Tests: Recent Labs  Lab 08/09/20 1135  AST 13*  ALT 9  ALKPHOS 50  BILITOT 0.3  PROT 7.4  ALBUMIN 3.8   No results for input(s): LIPASE, AMYLASE in the last 168 hours. No results for input(s): AMMONIA in the last 168 hours. Coagulation Profile: No results for input(s): INR, PROTIME in the last 168 hours. Cardiac Enzymes: No results for input(s): CKTOTAL, CKMB, CKMBINDEX, TROPONINI in the last 168 hours. BNP (last 3 results) No results for input(s): PROBNP in the last 8760 hours. HbA1C: No results for input(s): HGBA1C in the last 72 hours. CBG: Recent Labs  Lab 08/09/20 1134  GLUCAP 251*   Lipid Profile: No results for input(s):  CHOL, HDL, LDLCALC, TRIG, CHOLHDL, LDLDIRECT in the last 72 hours. Thyroid Function Tests: No results for input(s): TSH, T4TOTAL, FREET4, T3FREE, THYROIDAB in the last 72 hours. Anemia Panel: No results for input(s): VITAMINB12, FOLATE, FERRITIN, TIBC, IRON, RETICCTPCT in the last 72 hours.   Recent Results (from the past 240 hour(s))  Resp Panel by  RT-PCR (Flu A&B, Covid) Nasopharyngeal Swab     Status: None   Collection Time: 07/31/20 11:45 AM   Specimen: Nasopharyngeal Swab; Nasopharyngeal(NP) swabs in vial transport medium  Result Value Ref Range Status   SARS Coronavirus 2 by RT PCR NEGATIVE NEGATIVE Final    Comment: (NOTE) SARS-CoV-2 target nucleic acids are NOT DETECTED.  The SARS-CoV-2 RNA is generally detectable in upper respiratory specimens during the acute phase of infection. The lowest concentration of SARS-CoV-2 viral copies this assay can detect is 138 copies/mL. A negative result does not preclude SARS-Cov-2 infection and should not be used as the sole basis for treatment or other patient management decisions. A negative result may occur with  improper specimen collection/handling, submission of specimen other than nasopharyngeal swab, presence of viral mutation(s) within the areas targeted by this assay, and inadequate number of viral copies(<138 copies/mL). A negative result must be combined with clinical observations, patient history, and epidemiological information. The expected result is Negative.  Fact Sheet for Patients:  EntrepreneurPulse.com.au  Fact Sheet for Healthcare Providers:  IncredibleEmployment.be  This test is no t yet approved or cleared by the [place] FDA and  has been authorized for detection and/or diagnosis of SARS-CoV-2 by FDA under an Emergency Use Authorization (EUA). This EUA will remain  in effect (meaning this test can be used) for the duration of the COVID-19 declaration under Section  564(b)(1) of the Act, 21 U.S.C.section 360bbb-3(b)(1), unless the authorization is terminated  or revoked sooner.       Influenza A by PCR NEGATIVE NEGATIVE Final   Influenza B by PCR NEGATIVE NEGATIVE Final    Comment: (NOTE) The Xpert Xpress SARS-CoV-2/FLU/RSV plus assay is intended as an aid in the diagnosis of influenza from Nasopharyngeal swab specimens and should not be used as a sole basis for treatment. Nasal washings and aspirates are unacceptable for Xpert Xpress SARS-CoV-2/FLU/RSV testing.  Fact Sheet for Patients: EntrepreneurPulse.com.au  Fact Sheet for Healthcare Providers: IncredibleEmployment.be  This test is not yet approved or cleared by the [place] FDA and has been authorized for detection and/or diagnosis of SARS-CoV-2 by FDA under an Emergency Use Authorization (EUA). This EUA will remain in effect (meaning this test can be used) for the duration of the COVID-19 declaration under Section 564(b)(1) of the Act, 21 U.S.C. section 360bbb-3(b)(1), unless the authorization is terminated or revoked.  Performed at [facility], [place] [address] [place], [place] [address]   MRSA PCR Screening     Status: None   Collection Time: 07/31/20  4:43 PM   Specimen: Nasal Mucosa; Nasopharyngeal  Result Value Ref Range Status   MRSA by PCR NEGATIVE NEGATIVE Final    Comment:        The GeneXpert MRSA Assay (FDA approved for NASAL specimens only), is one component of a comprehensive MRSA colonization surveillance program. It is not intended to diagnose MRSA infection nor to guide or monitor treatment for MRSA infections. Performed at [facility], [place] [address] [place], [place] [address]   Resp Panel by RT-PCR (Flu A&B, Covid) Nasopharyngeal Swab     Status: None   Collection Time: 08/09/20  1:50 PM   Specimen: Nasopharyngeal Swab; Nasopharyngeal(NP) swabs in vial transport medium  Result Value Ref Range Status    SARS Coronavirus 2 by RT PCR NEGATIVE NEGATIVE Final    Comment: (NOTE) SARS-CoV-2 target nucleic acids are NOT DETECTED.  The SARS-CoV-2 RNA is generally detectable in upper respiratory specimens during the acute  phase of infection. The lowest concentration of SARS-CoV-2 viral copies this assay can detect is 138 copies/mL. A negative result does not preclude SARS-Cov-2 infection and should not be used as the sole basis for treatment or other patient management decisions. A negative result may occur with  improper specimen collection/handling, submission of specimen other than nasopharyngeal swab, presence of viral mutation(s) within the areas targeted by this assay, and inadequate number of viral copies(<138 copies/mL). A negative result must be combined with clinical observations, patient history, and epidemiological information. The expected result is Negative.  Fact Sheet for Patients:  EntrepreneurPulse.com.au  Fact Sheet for Healthcare Providers:  IncredibleEmployment.be  This test is no t yet approved or cleared by the [place] FDA and  has been authorized for detection and/or diagnosis of SARS-CoV-2 by FDA under an Emergency Use Authorization (EUA). This EUA will remain  in effect (meaning this test can be used) for the duration of the COVID-19 declaration under Section 564(b)(1) of the Act, 21 U.S.C.section 360bbb-3(b)(1), unless the authorization is terminated  or revoked sooner.       Influenza A by PCR NEGATIVE NEGATIVE Final   Influenza B by PCR NEGATIVE NEGATIVE Final    Comment: (NOTE) The Xpert Xpress SARS-CoV-2/FLU/RSV plus assay is intended as an aid in the diagnosis of influenza from Nasopharyngeal swab specimens and should not be used as a sole basis for treatment. Nasal washings and aspirates are unacceptable for Xpert Xpress SARS-CoV-2/FLU/RSV testing.  Fact Sheet for  Patients: EntrepreneurPulse.com.au  Fact Sheet for Healthcare Providers: IncredibleEmployment.be  This test is not yet approved or cleared by the [place] FDA and has been authorized for detection and/or diagnosis of SARS-CoV-2 by FDA under an Emergency Use Authorization (EUA). This EUA will remain in effect (meaning this test can be used) for the duration of the COVID-19 declaration under Section 564(b)(1) of the Act, 21 U.S.C. section 360bbb-3(b)(1), unless the authorization is terminated or revoked.  Performed at [facility], [address], [place],  [address]            Radiological Exams on Admission: Personally reviewed imaging which shows: Findings concerning for esophagitis CT ABDOMEN PELVIS WO CONTRAST  Result Date: 08/09/2020 CLINICAL DATA:  Abdominal pain.  Dialysis patient. EXAM: CT ABDOMEN AND PELVIS WITHOUT CONTRAST TECHNIQUE: Multidetector CT imaging of the abdomen and pelvis was performed following the standard protocol without IV contrast. COMPARISON:  04/20/2020. FINDINGS: Lower chest: Lung bases essentially clear. Heart normal in size. Distal esophagus is prominent with wall thickening. Hepatobiliary: Liver normal in size. There are vague areas of hypoattenuation noted along the anterior aspect of the left lobe and anterior dome of the right lobe and more inferiorly in the right lobe, similar to the most recent prior exam. No defined mass. Gallbladder surgically absent. No bile duct dilation. Pancreas: Unremarkable. No pancreatic ductal dilatation or surrounding inflammatory changes. Spleen: Small focus of calcification along the dome of the spleen, stable. Spleen otherwise normal. Adrenals/Urinary Tract: No adrenal masses. Right kidney not visualized. Atrophic left kidney. No renal masses. No left hydronephrosis. Left ureter normal in course and in caliber. Bladder is decompressed, grossly unremarkable.  Stomach/Bowel: Stomach is within normal limits. Appendix appears normal. No evidence of bowel wall thickening, distention, or inflammatory changes. Vascular/Lymphatic: Mild atherosclerotic calcification along the mid to peripheral superior mesenteric artery. Normal appearance of the aorta. Central venous catheter/dialysis catheter has been inserted through the right common femoral vein, tip projecting just below the inferior caval atrial junction, well  positioned. No enlarged lymph nodes. Reproductive: Status post hysterectomy. No adnexal masses. Other: Abdominal wall scarring most evident along the right lower quadrant. No hernia. No ascites. Musculoskeletal: No acute or significant osseous findings. IMPRESSION: 1. Circumferential wall thickening of the distal esophagus. Findings raise suspicion for esophagitis. 2. No other evidence of an acute abnormality within the abdomen or pelvis. 3. Vague areas of relative hypoattenuation in the liver, in retrospect similar to the prior CT. Consider follow-up liver MRI without and with contrast for further assessment. 4. Well-positioned dialysis catheter inserted through the right femoral vein. Electronically Signed   By: Lajean Manes M.D.   On: 08/09/2020 13:16         Assessment/Plan   1.  Upper GI bleeding - Likely secondary to esophagitis - CT on admission showed findings concerning for esophagitis - Fecal occult blood positive in the ED - GI consulted - Continue Protonix 40 mg IV Q12H -N.p.o. for now - Serial hemoglobin monitoring  2.  Esophagitis -See further plans above  3.  ESRD on IHD - No urgent hemodialysis needs - Nephrology consulted  4.  Type 1 diabetes -Hemoglobin A1c 11 on 08/01/2020 -Given n.p.o. status, will give half of her long-acting insulin daily - Glucose checks, sliding scale  5.  Anemia of chronic kidney disease -Currently stable - Close monitoring given upper GI bleed as above -Nephrology consulted     DVT  prophylaxis: SCDs Code Status: Full Family Communication: None Disposition Plan: Anticipate discharge home when medically optimized Consults called: GI and nephro Admission status: Progressive care unit    Medical decision making: Patient seen at 4:10 PM on 08/09/2020.  The patient was discussed with ER provider.  What exists of the patient's chart was reviewed in depth and summarized above.  Clinical condition: Fair.        Doran Heater [facility] Please page though [place] or Epic secure chat:  For password, contact charge nurse

## 2020-08-09 NOTE — ED Notes (Signed)
Some itchiness at soles of feet reported while in CT, will monitor.

## 2020-08-09 NOTE — ED Notes (Signed)
Coffee ground emesis x1

## 2020-08-09 NOTE — ED Notes (Signed)
Pt does not make urine - unable to collect sample.

## 2020-08-09 NOTE — Progress Notes (Signed)
Patients BP 76/58 (66), Neuro intact, Asymptomatic. No signs of bleeding noted.   Hal Hope, MD paged.  Verbal orders for bolus of fluids and stat CBC.

## 2020-08-09 NOTE — ED Provider Notes (Signed)
[facility] EMERGENC[facility] DEPT Provider Note   CSN: [id number] Arrival date & time: 08/09/20  1101     History Chief Complaint  Patient presents with  . Emesis  . Abdominal Pain    [name] is a 35 y.o. female.  HPI      35yo female with a history of diabetes, hypertension, hyperlipidemia, renal transplant in 2007, pseudoseizures, XXX syndrome, dialysis MWF, gastroparesis, presents with concern for nausea, vomiting and hematemesis.  Reports she is had 4 episodes of hematemesis since yesterday.  Reports that the first episode of vomiting was blood, describes it as a dark red blood.  Reports she has had dark black tarry stool for the last week.  She is had ongoing abdominal pain since she left the hospital a week ago.  Reports it has been worsening over the last week.  Describes epigastric abdominal pain.  Denies diarrhea or constipation.  Denies fevers.  She no longer makes urine.  No history of GI bleed.  Reports that she is a patient of [facility] (for abd pain/gastroparesis), but does not remember the name of her physician. Denies CP, dyspnea, cough.   Past Medical History:  Diagnosis Date  . Anemia   . Chronic kidney disease    kidney transplant 07  . Diabetes mellitus    Pt reports diagnosis in June 2011, Type 2  . GERD (gastroesophageal reflux disease)   . Hyperlipidemia   . Hypertension   . 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 Pseudoseizures ([place]) 12/22/2012  . Pyelonephritis 06/23/2014  . Seasonal allergies   . UTI (urinary tract infection) 01/09/2015  . 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 Acute upper GI bleed 08/09/2020  . DKA, type 2 ([place]) 07/31/2020  . Seizure disorder ([place]) 04/29/2020  . Polyneuropathy associated with underlying  disease ([place]) 04/29/2020  . Renal osteodystrophy 04/22/2020  . Hiatal hernia 04/22/2020  . Overweight (BMI 25.0-29.9) 04/22/2020  . Uncontrolled type 1 diabetes mellitus with hyperglycemia ([place]) 04/20/2020  . Type 1 diabetes mellitus with hyperosmolar hyperglycemic state (HHS) ([place]) 04/20/2020  . Hyponatremia 04/20/2020  . GERD (gastroesophageal reflux disease) 04/20/2020  . Abdominal pain 04/20/2020  . Prolonged QT interval 04/20/2020  . Streptococcus, group b, as the cause of diseases classified elsewhere 04/17/2020  . Wide-complex tachycardia ([place])   . Allergy, unspecified, initial encounter 11/07/2019  . Anaphylactic shock, unspecified, initial encounter 11/07/2019  . Hypotension 10/16/2019  . Pain in the abdomen 10/14/2019  . Hyperkalemia 10/11/2019  . Hypertensive urgency 10/11/2019  . Hyperglycemia 10/11/2019  . Intractable abdominal pain 06/19/2019  . Sinus tachycardia 06/19/2019  . Renal and perinephric abscess 11/01/2018  . Intra-abdominal abscess ([place]) 10/28/2018  . Hypertensive emergency 09/01/2018  . DKA (diabetic ketoacidosis) ([place]) 05/01/2018  . ESRD on hemodialysis ([place]) 05/01/2018  . Shortness of breath 04/20/2018  . Encounter for immunization 04/19/2018  . Iron deficiency anemia, unspecified 04/03/2018  . Coagulation defect, unspecified ([place]) 03/27/2018  . Complication of vascular dialysis catheter 03/27/2018  . Dependence on renal dialysis ([place]) 03/27/2018  . Kidney transplant failure 03/27/2018  . 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 Other idiopathic peripheral autonomic neuropathy 03/27/2018  . Renal sclerosis, unspecified 03/27/2018  . Renal failure 03/18/2018  . Cellulitis  of left leg 03/01/2018  . Hyperlipidemia 03/01/2018  . Hypocalcemia 03/01/2018  . Metabolic acidosis 99991111  . Hypokalemia 02/03/2018  . Incontinence of bowel 02/03/2018  . Chronic pain 11/11/2017  . Diabetic gastroparesis ([employer])   . Constipation 07/13/2017  .  Hematemesis 07/02/2017  . Dehydration 07/02/2017  . Chronic cholecystitis 06/29/2017  . Type 1 diabetes mellitus with complication, uncontrolled ([place]) 05/25/2015  . Intractable nausea and vomiting 01/09/2015  . Renal transplant recipient   . Immunosuppressed status ([place])   . ESRD (end stage renal disease) ([place]) 09/30/2014  . Chest pain 12/22/2012  . Pseudoseizures ([place]) 12/22/2012  . 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 Chronic kidney disease 01/04/2010  . Chronic kidney disease, stage II (mild) 01/04/2010  . OVARIAN FAILURE, PREMATURE 03/11/2009  . 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 OBESITY 09/25/2007  . Anemia due to chronic kidney disease 09/25/2007  . LEARNING DISABILITY 09/25/2007  . Essential hypertension, benign 09/25/2007    Past Surgical History:  Procedure Laterality Date  . ARTERIOVENOUS GRAFT PLACEMENT Bilateral    "neither work" (10/24/2017)  . AV FISTULA PLACEMENT Left 10/26/2018   Procedure: CREATION OF ARTERIOVENOUS FISTULA  LEFT ARM;  Surgeon: [name], MD;  Location: [facility];  Service: Vascular;  Laterality: Left;  . AV FISTULA PLACEMENT Left 05/23/2020   Procedure: LEFT ARM ARTERIOVENOUS (AV) FISTULA CREATION;  Surgeon: [name], MD;  Location: [facility];  Service: Vascular;  Laterality: Left;  . BASCILIC VEIN TRANSPOSITION Left 12/21/2018   Procedure: Left arm BASILIC VEIN TRANSPOSITION SECOND STAGE;  Surgeon: [name], MD;  Location: [facility];  Service: Vascular;  Laterality: Left;  . CHOLECYSTECTOMY N/A 06/30/2017   Procedure: LAPAROSCOPIC CHOLECYSTECTOMY WITH INTRAOPERATIVE CHOLANGIOGRAM;  Surgeon: Excell Seltzer, MD;  Location: [facility];  Service: General;  Laterality: N/A;  . ESOPHAGOGASTRODUODENOSCOPY (EGD) WITH PROPOFOL N/A 07/04/2017   Procedure: ESOPHAGOGASTRODUODENOSCOPY (EGD) WITH  PROPOFOL;  Surgeon: Clarene Essex, MD;  Location: [facility];  Service: Endoscopy;  Laterality: N/A;  . INSERTION OF DIALYSIS CATHETER N/A 03/20/2018   Procedure: INSERTION OF TUNNELED DIALYSIS CATHETER - RIGHT INTERANL JUGULAR PLACEMENT;  Surgeon: Angelia Mould, MD;  Location: [facility];  Service: Vascular;  Laterality: N/A;  . IR FLUORO GUIDE CV LINE RIGHT  04/18/2020  . IR GUIDED DRAIN W CATHETER PLACEMENT  10/28/2018  . KIDNEY TRANSPLANT  2007  . PARATHYROIDECTOMY  ?2012   "3/4 removed" (10/24/2017)  . RENAL BIOPSY Bilateral 2003  . UPPER EXTREMITY VENOGRAPHY Bilateral 10/19/2018   Procedure: UPPER EXTREMITY VENOGRAPHY;  Surgeon: [name], MD;  Location: [facility];  Service: Cardiovascular;  Laterality: Bilateral;  Bilateral      OB History    Gravida  0   Para  0   Term  0   Preterm  0   AB  0   Living  0     SAB  0   IAB  0   Ectopic  0   Multiple  0   Live Births  0           Family History  Problem Relation Age of Onset  . Arthritis Mother   . Hypertension Mother   . Aneurysm Mother        died of brain aneurysm  . CAD Father        Has 3 stents  . Diabetes Father  borderline  . Early death Brother        Died in war    Social History   Tobacco Use  . Smoking status: Never Smoker  . Smokeless tobacco: Never Used  Vaping Use  . Vaping Use: Never used  Substance Use Topics  . Alcohol use: No  . Drug use: No    Home Medications Prior to Admission medications   Medication Sig Start Date End Date Taking? Authorizing Provider  ACCU-CHEK SOFTCLIX LANCETS lancets Use to check blood sugar 4 times per day. Patient taking differently: 1 each by Other route in the morning, at noon, in the evening, and at bedtime. 12/29/15   Renato Shin, MD  acetaminophen (TYLENOL) 500 MG tablet Take 1,000 mg by mouth every 6 (six) hours as needed for headache (pain).     [provider]  amitriptyline (ELAVIL) 10 MG tablet Take  10 mg by mouth at bedtime. 05/01/20   [provider]  Calcium Carbonate Antacid (CALCIUM CARBONATE, DOSED IN MG ELEMENTAL CALCIUM,) 1250 MG/5ML SUSP Take 5 mLs by mouth 3 (three) times daily.  11/17/18   [provider]  D 1000 25 MCG (1000 UT) capsule Take 1,000 Units by mouth every other day.  04/27/19   [provider]  fluticasone (FLONASE) 50 MCG/ACT nasal spray Place 2 sprays into both nostrils daily as needed for allergies. 09/03/18   [name], MD  hydrOXYzine (ATARAX/VISTARIL) 50 MG tablet Take 1 tablet (50 mg total) by mouth 2 (two) times daily as needed for itching. Patient taking differently: Take 50 mg by mouth 2 (two) times daily as needed for itching. 02/06/18   [name], Ripudeep K, MD  insulin aspart (NOVOLOG) 100 UNIT/ML FlexPen Inject 2-10 Units into the skin 3 (three) times daily with meals.    [provider]  insulin degludec (TRESIBA) 100 UNIT/ML FlexTouch Pen Inject 20 Units into the skin daily. 02/15/20   Dwyane Dee, MD  loperamide (IMODIUM) 1 MG/5ML solution Take 2 mg by mouth 2 (two) times daily as needed for diarrhea or loose stools.     [provider]  metoCLOPramide (REGLAN) 5 MG tablet Take 2 tablets (10 mg total) by mouth 2 (two) times daily. 08/02/20   Geradine Girt, DO  metoprolol tartrate (LOPRESSOR) 25 MG tablet Take 1 tablet (25 mg total) by mouth 2 (two) times daily. 02/15/20   Dwyane Dee, MD  pantoprazole (PROTONIX) 40 MG tablet Take 40 mg by mouth 2 (two) times daily.     [provider]  promethazine (PHENERGAN) 25 MG tablet Take 25 mg by mouth every 6 (six) hours as needed for nausea or vomiting.  10/13/19   [provider]  simvastatin (ZOCOR) 20 MG tablet Take 20 mg by mouth at bedtime.     [provider]    Allergies    Diphenhydramine, Doxycycline, Motrin [ibuprofen], Shellfish allergy, Banana, Chlorhexidine, Ferrous sulfate, and Iron dextran  Review of Systems   Review of  Systems  Constitutional: Negative for chills and fever.  HENT: Negative for ear pain and sore throat.   Eyes: Negative for pain and visual disturbance.  Respiratory: Negative for cough and shortness of breath.   Cardiovascular: Negative for chest pain and palpitations.  Gastrointestinal: Positive for abdominal pain, blood in stool, nausea and vomiting. Negative for constipation and diarrhea.  Genitourinary: Negative for dysuria and hematuria.  Musculoskeletal: Negative for arthralgias and back pain.  Skin: Negative for color change and rash.  Neurological: Negative for  syncope.  All other systems reviewed and are negative.   Physical Exam Updated Vital Signs BP (!) 108/96   Pulse (!) 122   Temp 98.1 F (36.7 C)   Resp 20   SpO2 100%   Physical Exam Vitals and nursing note reviewed.  Constitutional:      General: Distressed: in pain, nausea.     Appearance: She is well-developed. She is ill-appearing. She is not diaphoretic.  HENT:     Head: Normocephalic and atraumatic.  Eyes:     Conjunctiva/sclera: Conjunctivae normal.  Cardiovascular:     Rate and Rhythm: Regular rhythm. Tachycardia present.     Heart sounds: Normal heart sounds. No murmur heard. No friction rub. No gallop.   Pulmonary:     Effort: Pulmonary effort is normal. No respiratory distress.     Breath sounds: Normal breath sounds. No wheezing or rales.  Abdominal:     General: There is no distension.     Palpations: Abdomen is soft.     Tenderness: There is generalized abdominal tenderness. There is no guarding.  Genitourinary:    Comments: Scant brown stool, hemoccult positive Musculoskeletal:        General: No tenderness.     Cervical back: Normal range of motion.  Skin:    General: Skin is warm and dry.     Findings: No erythema or rash.  Neurological:     Mental Status: She is alert and oriented to person, place, and time.     ED Results / Procedures / Treatments   Labs (all labs ordered are  listed, but only abnormal results are displayed) Labs Reviewed  BETA-HYDROXYBUTYRIC ACID - Abnormal; Notable for the following components:      Result Value   Beta-Hydroxybutyric Acid 1.09 (*)    All other components within normal limits  CBC WITH DIFFERENTIAL/PLATELET - Abnormal; Notable for the following components:   RBC 3.18 (*)    Hemoglobin 9.6 (*)    HCT 31.1 (*)    RDW 16.8 (*)    All other components within normal limits  COMPREHENSIVE METABOLIC PANEL - Abnormal; Notable for the following components:   Chloride 93 (*)    Glucose, Bld 247 (*)    BUN 41 (*)    Creatinine, Ser 5.91 (*)    Calcium 8.0 (*)    AST 13 (*)    GFR, Estimated 9 (*)    All other components within normal limits  HCG, QUANTITATIVE, PREGNANCY - Abnormal; Notable for the following components:   hCG, Beta Chain, Quant, S 5 (*)    All other components within normal limits  OCCULT BLOOD X 1 CARD TO LAB, STOOL - Abnormal; Notable for the following components:   Fecal Occult Bld POSITIVE (*)    All other components within normal limits  CBG MONITORING, ED - Abnormal; Notable for the following components:   Glucose-Capillary 251 (*)    All other components within normal limits  I-STAT VENOUS BLOOD GAS, ED - Abnormal; Notable for the following components:   pH, Ven 7.513 (*)    pCO2, Ven 43.5 (*)    pO2, Ven 63.0 (*)    Bicarbonate 35.0 (*)    TCO2 36 (*)    Acid-Base Excess 11.0 (*)    Sodium 134 (*)    Calcium, Ion 1.02 (*)    HCT 31.0 (*)    Hemoglobin 10.5 (*)    All other components within normal limits  RESP PANEL BY RT-PCR (FLU A&B,  COVID) ARPGX2  BETA-HYDROXYBUTYRIC ACID  BETA-HYDROXYBUTYRIC ACID    EKG None  Radiology CT ABDOMEN PELVIS WO CONTRAST  Result Date: 08/09/2020 CLINICAL DATA:  Abdominal pain.  Dialysis patient. EXAM: CT ABDOMEN AND PELVIS WITHOUT CONTRAST TECHNIQUE: Multidetector CT imaging of the abdomen and pelvis was performed following the standard protocol without IV  contrast. COMPARISON:  04/20/2020. FINDINGS: Lower chest: Lung bases essentially clear. Heart normal in size. Distal esophagus is prominent with wall thickening. Hepatobiliary: Liver normal in size. There are vague areas of hypoattenuation noted along the anterior aspect of the left lobe and anterior dome of the right lobe and more inferiorly in the right lobe, similar to the most recent prior exam. No defined mass. Gallbladder surgically absent. No bile duct dilation. Pancreas: Unremarkable. No pancreatic ductal dilatation or surrounding inflammatory changes. Spleen: Small focus of calcification along the dome of the spleen, stable. Spleen otherwise normal. Adrenals/Urinary Tract: No adrenal masses. Right kidney not visualized. Atrophic left kidney. No renal masses. No left hydronephrosis. Left ureter normal in course and in caliber. Bladder is decompressed, grossly unremarkable. Stomach/Bowel: Stomach is within normal limits. Appendix appears normal. No evidence of bowel wall thickening, distention, or inflammatory changes. Vascular/Lymphatic: Mild atherosclerotic calcification along the mid to peripheral superior mesenteric artery. Normal appearance of the aorta. Central venous catheter/dialysis catheter has been inserted through the right common femoral vein, tip projecting just below the inferior caval atrial junction, well positioned. No enlarged lymph nodes. Reproductive: Status post hysterectomy. No adnexal masses. Other: Abdominal wall scarring most evident along the right lower quadrant. No hernia. No ascites. Musculoskeletal: No acute or significant osseous findings. IMPRESSION: 1. Circumferential wall thickening of the distal esophagus. Findings raise suspicion for esophagitis. 2. No other evidence of an acute abnormality within the abdomen or pelvis. 3. Vague areas of relative hypoattenuation in the liver, in retrospect similar to the prior CT. Consider follow-up liver MRI without and with contrast for  further assessment. 4. Well-positioned dialysis catheter inserted through the right femoral vein. Electronically Signed   By: Lajean Manes M.D.   On: 08/09/2020 13:16    Procedures Procedures   Medications Ordered in ED Medications  pantoprazole (PROTONIX) 40 MG injection (has no administration in time range)  pantoprazole (PROTONIX) 80 mg in sodium chloride 0.9 % 100 mL IVPB (80 mg Intravenous New Bag/Given 08/09/20 1346)  fentaNYL (SUBLIMAZE) injection 50 mcg (50 mcg Intravenous Given 08/09/20 1251)  ondansetron (ZOFRAN) injection 4 mg (4 mg Intravenous Given 08/09/20 1250)  sodium chloride 0.9 % bolus 250 mL (0 mLs Intravenous Stopped 08/09/20 1446)  haloperidol lactate (HALDOL) injection 3 mg (3 mg Intravenous Given 08/09/20 1411)    ED Course  I have reviewed the triage vital signs and the nursing notes.  Pertinent labs & imaging results that were available during my care of the patient were reviewed by me and considered in my medical decision making (see chart for details).    MDM Rules/Calculators/A&P                          35yo female with a history of diabetes, hypertension, hyperlipidemia, renal transplant in 2007, pseudoseizures, XXX syndrome, dialysis MWF, gastroparesis, presents with concern for nausea, vomiting and hematemesis.  HCG 5 (essentially negative with prior similar values.)  No signs of DKA.  Hgb 9, decreased from 10 one week ago, and 14 11 days ago.  Has tachycardia, may be multifactorial/related to bleed or acute pain.  BP appear baseline.  Given 250cc fluid for rehydration. Unable to obtain type and screen at this facility.  Does not have melena on my exam but noted to have episode of hematemesis per nursing staff.  CT shows esophagitis.  Will admit for upper GI bleed, given protonix bolus. Discussed with [facility]. Will admit to stepdown unit for further care.     Final Clinical Impression(s) / ED Diagnoses Final diagnoses:  Upper GI bleed  Acute  esophagitis  Non-intractable vomiting with nausea, unspecified vomiting type    Rx / DC Orders ED Discharge Orders    None       Gareth Morgan, MD 08/09/20 1530

## 2020-08-09 NOTE — ED Triage Notes (Signed)
She reports numerous episodes of n/v "since dialysis yesterday". She tells me her emesis has become bloody "there's black and red stuff in it" [sic]. She also c/o "severe pain in my stomach". (she points to epigastrium.)

## 2020-08-09 NOTE — Consult Note (Addendum)
[facility] Consult: 9:58 AM 08/10/2020  LOS: 1 day    Referring Provider: Dr Billy Fischer at [facility] Physician:  [name], MD Primary Gastroenterologist:  Dr. Carlean Purl    Reason for Consultation:  Hematemesis.     HPI: [name] is a 35 y.o. female.  XXX syndrome. Type 2 IDDM.  Diabetic gastroparesis and neuropathy.  ESRD.  Renal transplant 2007, subsequently failed and started back hemodialysis MWF in 2019.  Hysterectomy.   Past admissions for management of acute gastroparesis. 07/04/2017 EGD (Dr Watt Climes) for nausea, vomiting: moderate esophagitis in the mid to distal esophagus.  Large food residue in stomach. Doing well at office visit with GI PA on 07/25/2019 using Reglan 10 mg 2-3 times a day, Protonix 40 mg/day, occasional Zofran.    Admission 5/19 -08/02/2020 with N/V/abdominal pain very similar to symptoms at time of admission in February.  Had not take insulin for 3 days, "I have been moving and had a lot of my plate".  Blood glucose 480 and beta hydroxybutyrate elevated, A1C c/w avg glucose of 269.  Diagnosed as early DKA.  N/V, abdominal pain resolved.  Also suspicion for OSA and suggestion for outpatient sleep study.  On Friday, 2 days ago she vomited blood twice.  The first episode was larger volume, perhaps is much as 4 ounces.  The second episode was smaller volume.  No vomiting since.  Stools brown.  Takes Alka-Seltzer 5 to 7/day and Mylanta 4 times a day in addition to Protonix twice a day, Pepcid for chronic upper abdominal pain she has had for some years.  Occasionally takes Reglan for nausea but does not suffer from frequent nausea. No dizziness, no shortness of breath, no weakness.  Went to the droppage ED yesterday and ultimately transferred and admitted to [facility]   Hypotensive, this is a chronic issue.  Treated with fluids.  Hgb has dropped, see below.  Wants to go home.  Renal, Dr. Royce Macadamia, has convinced her to stay at least 2 receive a unit of blood this morning.  CTAP w/o contrast: Thickening at distal esophagus.  Vague areas of relative hypoattenuation in the liver, similar to prior 04/2020 renal stone protocol CT.  Dialysis catheter in the right femoral vein. Hgb 9.6 >> 7.2.  Was 12.5 -14.3 both readings on 07/31/2020.normal platelets.  No coags.      MCV 98.  INR 1.2. LFTs normal.  BUN/creatinine elevated as expected in ESRD.    Blood glucose 247.   Past Medical History:  Diagnosis Date  . Anemia   . Chronic kidney disease    kidney transplant 07  . Diabetes mellitus    Pt reports diagnosis in June 2011, Type 2  . GERD (gastroesophageal reflux disease)   . Hyperlipidemia   . Hypertension   . 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 Pyelonephritis 06/23/2014  . Seasonal allergies   . UTI (urinary tract  infection) 01/09/2015  . 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 ARTERIOVENOUS GRAFT PLACEMENT Bilateral    "neither work" (10/24/2017)  . AV FISTULA PLACEMENT Left 10/26/2018   Procedure: CREATION OF ARTERIOVENOUS FISTULA  LEFT ARM;  Surgeon: [name], MD;  Location: [facility];  Service: Vascular;  Laterality: Left;  . AV FISTULA PLACEMENT Left 05/23/2020   Procedure: LEFT ARM ARTERIOVENOUS (AV) FISTULA CREATION;  Surgeon: [name], MD;  Location: [place];  Service: Vascular;  Laterality: Left;  . BASCILIC VEIN TRANSPOSITION Left 12/21/2018   Procedure: Left arm BASILIC VEIN TRANSPOSITION SECOND STAGE;  Surgeon: [name], MD;  Location: [place];  Service: Vascular;  Laterality: Left;  .  CHOLECYSTECTOMY N/A 06/30/2017   Procedure: LAPAROSCOPIC CHOLECYSTECTOMY WITH INTRAOPERATIVE CHOLANGIOGRAM;  Surgeon: Excell Seltzer, MD;  Location: [facility];  Service: General;  Laterality: N/A;  . ESOPHAGOGASTRODUODENOSCOPY (EGD) WITH PROPOFOL N/A 07/04/2017   Procedure: ESOPHAGOGASTRODUODENOSCOPY (EGD) WITH PROPOFOL;  Surgeon: Clarene Essex, MD;  Location: [facility];  Service: Endoscopy;  Laterality: N/A;  . INSERTION OF DIALYSIS CATHETER N/A 03/20/2018   Procedure: INSERTION OF TUNNELED DIALYSIS CATHETER - RIGHT INTERANL JUGULAR PLACEMENT;  Surgeon: Angelia Mould, MD;  Location: [facility];  Service: Vascular;  Laterality: N/A;  . IR FLUORO GUIDE CV LINE RIGHT  04/18/2020  . IR GUIDED DRAIN W CATHETER PLACEMENT  10/28/2018  . KIDNEY TRANSPLANT  2007  . PARATHYROIDECTOMY  ?2012   "3/4 removed" (10/24/2017)  . RENAL BIOPSY Bilateral 2003  . UPPER EXTREMITY VENOGRAPHY Bilateral 10/19/2018   Procedure: UPPER EXTREMITY VENOGRAPHY;  Surgeon: [name], MD;  Location: [facility];  Service: Cardiovascular;  Laterality: Bilateral;  Bilateral     Prior to Admission medications   Medication Sig Start Date End Date Taking? Authorizing Provider  ACCU-CHEK SOFTCLIX LANCETS lancets Use to check blood sugar 4 times per day. Patient taking differently: 1 each by Other route in the morning, at noon, in the evening, and at bedtime. 12/29/15   Renato Shin, MD  acetaminophen (TYLENOL) 500 MG tablet Take 1,000 mg by mouth every 6 (six) hours as needed for headache (pain).     [provider]  amitriptyline (ELAVIL) 10 MG tablet Take 10 mg by mouth at bedtime. 05/01/20   [provider]  Calcium Carbonate Antacid (CALCIUM CARBONATE, DOSED IN MG ELEMENTAL CALCIUM,) 1250 MG/5ML SUSP Take 5 mLs by mouth 3 (three) times daily.  11/17/18   [provider]  D 1000 25 MCG (1000 UT) capsule Take 1,000 Units by mouth every other day.  04/27/19   [provider]   fluticasone (FLONASE) 50 MCG/ACT nasal spray Place 2 sprays into both nostrils daily as needed for allergies. 09/03/18   [name], MD  hydrOXYzine (ATARAX/VISTARIL) 50 MG tablet Take 1 tablet (50 mg total) by mouth 2 (two) times daily as needed for itching. Patient taking differently: Take 50 mg by mouth 2 (two) times daily as needed for itching. 02/06/18   [name], Ripudeep K, MD  insulin aspart (NOVOLOG) 100 UNIT/ML FlexPen Inject 2-10 Units into the skin 3 (three) times daily with meals.    [provider]  insulin degludec (TRESIBA) 100 UNIT/ML FlexTouch Pen Inject 20 Units into the skin daily. 02/15/20   Dwyane Dee, MD  loperamide (IMODIUM) 1 MG/5ML solution Take 2 mg by mouth 2 (two) times daily as  needed for diarrhea or loose stools.     [provider]  metoCLOPramide (REGLAN) 5 MG tablet Take 2 tablets (10 mg total) by mouth 2 (two) times daily. 08/02/20   Geradine Girt, DO  metoprolol tartrate (LOPRESSOR) 25 MG tablet Take 1 tablet (25 mg total) by mouth 2 (two) times daily. 02/15/20   Dwyane Dee, MD  pantoprazole (PROTONIX) 40 MG tablet Take 40 mg by mouth 2 (two) times daily.     [provider]  promethazine (PHENERGAN) 25 MG tablet Take 25 mg by mouth every 6 (six) hours as needed for nausea or vomiting.  10/13/19   [provider]  simvastatin (ZOCOR) 20 MG tablet Take 20 mg by mouth at bedtime.     [provider]    Scheduled Meds: . sodium chloride   Intravenous Once  . insulin aspart  0-15 Units Subcutaneous TID WC  . insulin aspart  0-5 Units Subcutaneous QHS  . insulin glargine  10 Units Subcutaneous QHS  . pantoprazole (PROTONIX) IV  40 mg Intravenous Q12H   Infusions:  PRN Meds:    Allergies as of 08/09/2020 - Review Complete 08/09/2020  Allergen Reaction Noted  . Diphenhydramine Anaphylaxis 12/27/2019  . Doxycycline Shortness Of Breath 11/20/2015  . Motrin [ibuprofen] Shortness Of Breath and Itching 09/17/2010   . Shellfish allergy Hives 12/22/2012  . Banana Itching, Nausea And Vomiting, and Other (See Comments) 12/22/2012  . Chlorhexidine Itching 06/15/2019  . Ferrous sulfate Itching 07/19/2018  . Iron dextran Itching and Other (See Comments) 02/24/2010    Family History  Problem Relation Age of Onset  . Arthritis Mother   . Hypertension Mother   . Aneurysm Mother        died of brain aneurysm  . CAD Father        Has 3 stents  . Diabetes Father        borderline  . Early death Brother        Died in war    Social History   Socioeconomic History  . Marital status: Single    Spouse name: Not on file  . Number of children: Not on file  . Years of education: Not on file  . Highest education level: Not on file  Occupational History  . 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 Smoking status: Never Smoker  . Smokeless tobacco: Never Used  Vaping Use  . Vaping Use: Never used  Substance and Sexual Activity  . Alcohol use: No  . Drug use: No  . Sexual activity: Yes    Birth control/protection: None  Other Topics Concern  . Not on file  Social History Narrative   Patient reports that she is single,    Right Handed   Drinks caffeine rarely   Social Determinants of Health   Financial Resource Strain: Not on file  Food Insecurity: Not on file  Transportation Needs: Not on file  Physical Activity: Not on file  Stress: Not on file  Social Connections: Not on file  Intimate Partner Violence: Not on file    REVIEW OF SYSTEMS: Constitutional: No weakness, no dizziness. ENT:  No nose bleeds Pulm: No shortness of breath or cough. CV:  No palpitations, no LE edema.  No angina. GU:  No hematuria, no frequency GI: See HPI. Heme: No unusual bleeding or bruising. Transfusions:  Epic records reveal she has been transfused with blood in  January 2020, February 2020, August 2020 Neuro:  No headaches, no peripheral tingling or  numbness Derm:  No itching, no rash or sores.  Endocrine:  No sweats or chills.  No polyuria or dysuria   PHYSICAL EXAM: Vital signs in last 24 hours: Vitals:   08/10/20 0600 08/10/20 0820  BP: 103/62 (!) 124/53  Pulse:  90  Resp:  17  Temp:  98.3 F (36.8 C)  SpO2:  96%   Wt Readings from Last 3 Encounters:  08/01/20 78.7 kg  07/04/20 82.6 kg  05/23/20 80.7 kg    General: Obese.  Not ill-appearing, comfortable, somewhat irritable Head: No facial asymmetry or swelling.  No signs of head trauma. Eyes: Conjunctiva moderately pale.  No scleral icterus.  EOMI. Ears: Not hard of hearing Nose: No discharge or congestion Mouth: Good dentition.  Tongue midline.  Mucosa pink, moist, clear. Neck: No JVD, no thyromegaly, no masses Lungs: No labored breathing or cough.  Excellent breath sounds bilaterally Heart: RRR.  No MRG.  S1, S2 present Abdomen: Soft.  Surgical scars well-healed.  Hyperpigmented areas in mid abdomen on the left.  Minimal tenderness in the left and epigastric regions.  No guarding or rebound.  No HSM, masses, bruits, hernias.   Rectal: Deferred Musc/Skeltl: No joint redness, swelling or gross deformities. Extremities: No CCE. Neurologic: Oriented x3.  Alert.  Moves all 4 limbs with full strength.  No tremors. Skin: No rashes, no sores, no suspicious lesions Nodes: No cervical adenopathy Psych: Cooperative.  Fluid speech.  Intake/Output from previous day: 05/28 0701 - 05/29 0700 In: 250 [IV Piggyback:250] Out: -  Intake/Output this shift: No intake/output data recorded.  LAB RESULTS: Recent Labs    08/09/20 1135 08/09/20 1218 08/09/20 2016 08/10/20 0218 08/10/20 0222  WBC 7.3  --   --  7.6 7.7  HGB 9.6* 10.5* 8.4* 7.3* 7.2*  HCT 31.1* 31.0*  --  23.7* 23.5*  PLT 273  --   --  223 222   BMET Lab Results  Component Value Date   NA 136 08/10/2020   NA 134 (L) 08/09/2020   NA 137 08/09/2020   K 3.9 08/10/2020   K 4.3 08/09/2020   K 4.3  08/09/2020   CL 96 (L) 08/10/2020   CL 93 (L) 08/09/2020   CL 96 (L) 08/02/2020   CO2 29 08/10/2020   CO2 29 08/09/2020   CO2 26 08/02/2020   GLUCOSE 182 (H) 08/10/2020   GLUCOSE 247 (H) 08/09/2020   GLUCOSE 150 (H) 08/02/2020   BUN 45 (H) 08/10/2020   BUN 41 (H) 08/09/2020   BUN 32 (H) 08/02/2020   CREATININE 7.42 (H) 08/10/2020   CREATININE 5.91 (H) 08/09/2020   CREATININE 7.29 (H) 08/02/2020   CALCIUM 6.8 (L) 08/10/2020   CALCIUM 8.0 (L) 08/09/2020   CALCIUM 7.7 (L) 08/02/2020   LFT Recent Labs    08/09/20 1135 08/10/20 0222  PROT 7.4 5.8*  ALBUMIN 3.8 2.8*  AST 13* 11*  ALT 9 10  ALKPHOS 50 41  BILITOT 0.3 0.5   PT/INR Lab Results  Component Value Date   INR 1.2 02/12/2020   INR 1.2 02/12/2020   INR 1.1 06/19/2019   Hepatitis Panel No results for input(s): HEPBSAG, HCVAB, HEPAIGM, HEPBIGM in the last 72 hours. C-Diff No components found for: CDIFF Lipase     Component Value Date/Time   LIPASE 53 (H) 04/20/2020 1748   COVID-negative  Drugs of Abuse     Component Value Date/Time  LABOPIA POSITIVE (A) 11/11/2017 2050   COCAINSCRNUR NONE DETECTED 11/11/2017 2050   LABBENZ NONE DETECTED 11/11/2017 2050   AMPHETMU NONE DETECTED 11/11/2017 2050   THCU NONE DETECTED 11/11/2017 2050   LABBARB NONE DETECTED 11/11/2017 2050     RADIOLOGY STUDIES: CT ABDOMEN PELVIS WO CONTRAST  Result Date: 08/09/2020 CLINICAL DATA:  Abdominal pain.  Dialysis patient. EXAM: CT ABDOMEN AND PELVIS WITHOUT CONTRAST TECHNIQUE: Multidetector CT imaging of the abdomen and pelvis was performed following the standard protocol without IV contrast. COMPARISON:  04/20/2020. FINDINGS: Lower chest: Lung bases essentially clear. Heart normal in size. Distal esophagus is prominent with wall thickening. Hepatobiliary: Liver normal in size. There are vague areas of hypoattenuation noted along the anterior aspect of the left lobe and anterior dome of the right lobe and more inferiorly in the  right lobe, similar to the most recent prior exam. No defined mass. Gallbladder surgically absent. No bile duct dilation. Pancreas: Unremarkable. No pancreatic ductal dilatation or surrounding inflammatory changes. Spleen: Small focus of calcification along the dome of the spleen, stable. Spleen otherwise normal. Adrenals/Urinary Tract: No adrenal masses. Right kidney not visualized. Atrophic left kidney. No renal masses. No left hydronephrosis. Left ureter normal in course and in caliber. Bladder is decompressed, grossly unremarkable. Stomach/Bowel: Stomach is within normal limits. Appendix appears normal. No evidence of bowel wall thickening, distention, or inflammatory changes. Vascular/Lymphatic: Mild atherosclerotic calcification along the mid to peripheral superior mesenteric artery. Normal appearance of the aorta. Central venous catheter/dialysis catheter has been inserted through the right common femoral vein, tip projecting just below the inferior caval atrial junction, well positioned. No enlarged lymph nodes. Reproductive: Status post hysterectomy. No adnexal masses. Other: Abdominal wall scarring most evident along the right lower quadrant. No hernia. No ascites. Musculoskeletal: No acute or significant osseous findings. IMPRESSION: 1. Circumferential wall thickening of the distal esophagus. Findings raise suspicion for esophagitis. 2. No other evidence of an acute abnormality within the abdomen or pelvis. 3. Vague areas of relative hypoattenuation in the liver, in retrospect similar to the prior CT. Consider follow-up liver MRI without and with contrast for further assessment. 4. Well-positioned dialysis catheter inserted through the right femoral vein. Electronically Signed   By: Lajean Manes M.D.   On: 08/09/2020 13:16      IMPRESSION:   *   Hematemesis in pt w known diabetic gastroparesis and esophagitis. Esophagitis evident on CT Takes PPI, H2 blocker, antacids as well as  Alka-Seltzer. Rule out Mallory-Weiss tear.  Rule out esophagitis, rule out ulcer.  *   Hx gastroparesis  *   Chronic upper abdominal pain.    *   Vague areas of hypoattenuation in liver per CT, stable c/w 04/2020.  LFTs normal.    *   Anemia.  Hgb down ~ 3.5 g in less than 24 hours.    *   IDDM, generally not well controlled.    *    ESRD.  2007 renal transplant, eventually failed and started back on MWF HD in 2019.    Chronic nausea and vomiting from diabetic gastroparesis  PLAN:     *   EGD.  ? Timing?   *   switched to po bid Protonix.     Azucena Freed  08/10/2020, 9:58 AM Phone [phone]  I have reviewed the entire case in detail with the above APP and discussed the plan in detail.  Therefore, I agree with the diagnoses recorded above. In addition,  I have personally interviewed  and examined the patient.  My additional thoughts are as follows:  Acute on chronic nausea and vomiting and upper abdominal pain from diabetic related gastroparesis.  It sounds like she has lately been noncompliant with her insulin.  She is also been using Alka-Seltzer,, some formulations of which contain aspirin.  She initially had a guarded demeanor, but after spending some time together and explaining her medical history including her prior upper endoscopy, she softened her approach and we communicated well.  She had hematemesis on this occasion, which seems to be the first time it is occurred.  It is most likely from gastric or esophageal mucosal tear from retching due to the gastroparesis, also must consider peptic ulcer in a patient who may be taking aspirin and is on hemodialysis.  Her baseline hemoglobin is somewhat difficult to determine on previous lab tests, as they have generally been inpatient labs.  As near as can be determined, the current hemoglobin of 7.5 seems to be down from usual for her.  She also tends to run a low blood pressure.  Last 2 hemoglobin checks were stable, and  nephrology would like her to have a unit of PRBCs prior to discharge. She has no further hematemesis nor has she had melena since admission.  I recommended she undergo upper endoscopy with [place] today (or tomorrow depending on anesthesia's availability). Procedure was described in detail along with risks and benefits and she was agreeable.  The benefits and risks of the planned procedure were described in detail with the patient or (when appropriate) their health care proxy.  Risks were outlined as including, but not limited to, bleeding, infection, perforation, adverse medication reaction leading to cardiac or pulmonary decompensation, pancreatitis (if ERCP).  The limitation of incomplete mucosal visualization was also discussed.  No guarantees or warranties were given.  Patient at increased risk for cardiopulmonary complications of procedure due to medical comorbidities.  This patient's abdominal pain and gastroparesis will continue to be a long-term management challenge due to her complex medical conditions and variable compliance with diabetic treatment.  She also needs to be on long-term acid suppression. Nelida Meuse III Office:[phone]

## 2020-08-09 NOTE — ED Notes (Signed)
RESTRICTIONS BAND placed on pt's left wrist.

## 2020-08-09 NOTE — ED Notes (Signed)
Pain and nausea meds given, pt to CT at this time.

## 2020-08-09 NOTE — Progress Notes (Signed)
Pt arrived to unit . A/O x 4,  CCMD called ,CHG given, pt oriented to unit,Will continue to monitor.   Phoebe Sharps, RN

## 2020-08-09 NOTE — H&P (View-Only) (Signed)
[facility] Consult: 9:58 AM 08/10/2020  LOS: 1 day    Referring Provider: Dr Billy Fischer at [facility] Physician:  [name], MD Primary Gastroenterologist:  Dr. Carlean Purl    Reason for Consultation:  Hematemesis.     HPI: [name] is a 35 y.o. female.  XXX syndrome. Type 2 IDDM.  Diabetic gastroparesis and neuropathy.  ESRD.  Renal transplant 2007, subsequently failed and started back hemodialysis MWF in 2019.  Hysterectomy.   Past admissions for management of acute gastroparesis. 07/04/2017 EGD (Dr Watt Climes) for nausea, vomiting: moderate esophagitis in the mid to distal esophagus.  Large food residue in stomach. Doing well at office visit with GI PA on 07/25/2019 using Reglan 10 mg 2-3 times a day, Protonix 40 mg/day, occasional Zofran.    Admission 5/19 -08/02/2020 with N/V/abdominal pain very similar to symptoms at time of admission in February.  Had not take insulin for 3 days, "I have been moving and had a lot of my plate".  Blood glucose 480 and beta hydroxybutyrate elevated, A1C c/w avg glucose of 269.  Diagnosed as early DKA.  N/V, abdominal pain resolved.  Also suspicion for OSA and suggestion for outpatient sleep study.  On Friday, 2 days ago she vomited blood twice.  The first episode was larger volume, perhaps is much as 4 ounces.  The second episode was smaller volume.  No vomiting since.  Stools brown.  Takes Alka-Seltzer 5 to 7/day and Mylanta 4 times a day in addition to Protonix twice a day, Pepcid for chronic upper abdominal pain she has had for some years.  Occasionally takes Reglan for nausea but does not suffer from frequent nausea. No dizziness, no shortness of breath, no weakness.  Went to the droppage ED yesterday and ultimately transferred and admitted to [facility].   Hypotensive, this is a chronic issue.  Treated with fluids.  Hgb has dropped, see below.  Wants to go home.  Renal, Dr. Royce Macadamia, has convinced her to stay at least 2 receive a unit of blood this morning.  CTAP w/o contrast: Thickening at distal esophagus.  Vague areas of relative hypoattenuation in the liver, similar to prior 04/2020 renal stone protocol CT.  Dialysis catheter in the right femoral vein. Hgb 9.6 >> 7.2.  Was 12.5 -14.3 both readings on 07/31/2020.normal platelets.  No coags.      MCV 98.  INR 1.2. LFTs normal.  BUN/creatinine elevated as expected in ESRD.    Blood glucose 247.   Past Medical History:  Diagnosis Date  . Anemia   . Chronic kidney disease    kidney transplant 07  . Diabetes mellitus    Pt reports diagnosis in June 2011, Type 2  . GERD (gastroesophageal reflux disease)   . Hyperlipidemia   . Hypertension   . 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 Pyelonephritis 06/23/2014  . Seasonal allergies   . UTI (urinary tract  infection) 01/09/2015  . 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 ARTERIOVENOUS GRAFT PLACEMENT Bilateral    "neither work" (10/24/2017)  . AV FISTULA PLACEMENT Left 10/26/2018   Procedure: CREATION OF ARTERIOVENOUS FISTULA  LEFT ARM;  Surgeon: [name], MD;  Location: [place];  Service: Vascular;  Laterality: Left;  . AV FISTULA PLACEMENT Left 05/23/2020   Procedure: LEFT ARM ARTERIOVENOUS (AV) FISTULA CREATION;  Surgeon: [name], MD;  Location: [place];  Service: Vascular;  Laterality: Left;  . BASCILIC VEIN TRANSPOSITION Left 12/21/2018   Procedure: Left arm BASILIC VEIN TRANSPOSITION SECOND STAGE;  Surgeon: [name], MD;  Location: [place];  Service: Vascular;  Laterality: Left;  .  CHOLECYSTECTOMY N/A 06/30/2017   Procedure: LAPAROSCOPIC CHOLECYSTECTOMY WITH INTRAOPERATIVE CHOLANGIOGRAM;  Surgeon: Excell Seltzer, MD;  Location: [facility];  Service: General;  Laterality: N/A;  . ESOPHAGOGASTRODUODENOSCOPY (EGD) WITH PROPOFOL N/A 07/04/2017   Procedure: ESOPHAGOGASTRODUODENOSCOPY (EGD) WITH PROPOFOL;  Surgeon: Clarene Essex, MD;  Location: [facility];  Service: Endoscopy;  Laterality: N/A;  . INSERTION OF DIALYSIS CATHETER N/A 03/20/2018   Procedure: INSERTION OF TUNNELED DIALYSIS CATHETER - RIGHT INTERANL JUGULAR PLACEMENT;  Surgeon: Angelia Mould, MD;  Location: [place];  Service: Vascular;  Laterality: N/A;  . IR FLUORO GUIDE CV LINE RIGHT  04/18/2020  . IR GUIDED DRAIN W CATHETER PLACEMENT  10/28/2018  . KIDNEY TRANSPLANT  2007  . PARATHYROIDECTOMY  ?2012   "3/4 removed" (10/24/2017)  . RENAL BIOPSY Bilateral 2003  . UPPER EXTREMITY VENOGRAPHY Bilateral 10/19/2018   Procedure: UPPER EXTREMITY VENOGRAPHY;  Surgeon: [name], MD;  Location: [place] [facility];  Service: Cardiovascular;  Laterality: Bilateral;  Bilateral     Prior to Admission medications   Medication Sig Start Date End Date Taking? Authorizing Provider  ACCU-CHEK SOFTCLIX LANCETS lancets Use to check blood sugar 4 times per day. Patient taking differently: 1 each by Other route in the morning, at noon, in the evening, and at bedtime. 12/29/15   Renato Shin, MD  acetaminophen (TYLENOL) 500 MG tablet Take 1,000 mg by mouth every 6 (six) hours as needed for headache (pain).     [provider]  amitriptyline (ELAVIL) 10 MG tablet Take 10 mg by mouth at bedtime. 05/01/20   [provider]  Calcium Carbonate Antacid (CALCIUM CARBONATE, DOSED IN MG ELEMENTAL CALCIUM,) 1250 MG/5ML SUSP Take 5 mLs by mouth 3 (three) times daily.  11/17/18   [provider]  D 1000 25 MCG (1000 UT) capsule Take 1,000 Units by mouth every other day.  04/27/19   [provider]   fluticasone (FLONASE) 50 MCG/ACT nasal spray Place 2 sprays into both nostrils daily as needed for allergies. 09/03/18   [name], MD  hydrOXYzine (ATARAX/VISTARIL) 50 MG tablet Take 1 tablet (50 mg total) by mouth 2 (two) times daily as needed for itching. Patient taking differently: Take 50 mg by mouth 2 (two) times daily as needed for itching. 02/06/18   [name], Ripudeep K, MD  insulin aspart (NOVOLOG) 100 UNIT/ML FlexPen Inject 2-10 Units into the skin 3 (three) times daily with meals.    [provider]  insulin degludec (TRESIBA) 100 UNIT/ML FlexTouch Pen Inject 20 Units into the skin daily. 02/15/20   Dwyane Dee, MD  loperamide (IMODIUM) 1 MG/5ML solution Take 2 mg by mouth 2 (two) times daily as  needed for diarrhea or loose stools.     [provider]  metoCLOPramide (REGLAN) 5 MG tablet Take 2 tablets (10 mg total) by mouth 2 (two) times daily. 08/02/20   Geradine Girt, DO  metoprolol tartrate (LOPRESSOR) 25 MG tablet Take 1 tablet (25 mg total) by mouth 2 (two) times daily. 02/15/20   Dwyane Dee, MD  pantoprazole (PROTONIX) 40 MG tablet Take 40 mg by mouth 2 (two) times daily.     [provider]  promethazine (PHENERGAN) 25 MG tablet Take 25 mg by mouth every 6 (six) hours as needed for nausea or vomiting.  10/13/19   [provider]  simvastatin (ZOCOR) 20 MG tablet Take 20 mg by mouth at bedtime.     [provider]    Scheduled Meds: . sodium chloride   Intravenous Once  . insulin aspart  0-15 Units Subcutaneous TID WC  . insulin aspart  0-5 Units Subcutaneous QHS  . insulin glargine  10 Units Subcutaneous QHS  . pantoprazole (PROTONIX) IV  40 mg Intravenous Q12H   Infusions:  PRN Meds:    Allergies as of 08/09/2020 - Review Complete 08/09/2020  Allergen Reaction Noted  . Diphenhydramine Anaphylaxis 12/27/2019  . Doxycycline Shortness Of Breath 11/20/2015  . Motrin [ibuprofen] Shortness Of Breath and Itching 09/17/2010   . Shellfish allergy Hives 12/22/2012  . Banana Itching, Nausea And Vomiting, and Other (See Comments) 12/22/2012  . Chlorhexidine Itching 06/15/2019  . Ferrous sulfate Itching 07/19/2018  . Iron dextran Itching and Other (See Comments) 02/24/2010    Family History  Problem Relation Age of Onset  . Arthritis Mother   . Hypertension Mother   . Aneurysm Mother        died of brain aneurysm  . CAD Father        Has 3 stents  . Diabetes Father        borderline  . Early death Brother        Died in war    Social History   Socioeconomic History  . Marital status: Single    Spouse name: Not on file  . Number of children: Not on file  . Years of education: Not on file  . Highest education level: Not on file  Occupational History  . 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 Smoking status: Never Smoker  . Smokeless tobacco: Never Used  Vaping Use  . Vaping Use: Never used  Substance and Sexual Activity  . Alcohol use: No  . Drug use: No  . Sexual activity: Yes    Birth control/protection: None  Other Topics Concern  . Not on file  Social History Narrative   Patient reports that she is single,    Right Handed   Drinks caffeine rarely   Social Determinants of Health   Financial Resource Strain: Not on file  Food Insecurity: Not on file  Transportation Needs: Not on file  Physical Activity: Not on file  Stress: Not on file  Social Connections: Not on file  Intimate Partner Violence: Not on file    REVIEW OF SYSTEMS: Constitutional: No weakness, no dizziness. ENT:  No nose bleeds Pulm: No shortness of breath or cough. CV:  No palpitations, no LE edema.  No angina. GU:  No hematuria, no frequency GI: See HPI. Heme: No unusual bleeding or bruising. Transfusions:  Epic records reveal she has been transfused with blood in  January 2020, February 2020, August 2020 Neuro:  No headaches, no peripheral tingling or  numbness Derm:  No itching, no rash or sores.  Endocrine:  No sweats or chills.  No polyuria or dysuria   PHYSICAL EXAM: Vital signs in last 24 hours: Vitals:   08/10/20 0600 08/10/20 0820  BP: 103/62 (!) 124/53  Pulse:  90  Resp:  17  Temp:  98.3 F (36.8 C)  SpO2:  96%   Wt Readings from Last 3 Encounters:  08/01/20 78.7 kg  07/04/20 82.6 kg  05/23/20 80.7 kg    General: Obese.  Not ill-appearing, comfortable, somewhat irritable Head: No facial asymmetry or swelling.  No signs of head trauma. Eyes: Conjunctiva moderately pale.  No scleral icterus.  EOMI. Ears: Not hard of hearing Nose: No discharge or congestion Mouth: Good dentition.  Tongue midline.  Mucosa pink, moist, clear. Neck: No JVD, no thyromegaly, no masses Lungs: No labored breathing or cough.  Excellent breath sounds bilaterally Heart: RRR.  No MRG.  S1, S2 present Abdomen: Soft.  Surgical scars well-healed.  Hyperpigmented areas in mid abdomen on the left.  Minimal tenderness in the left and epigastric regions.  No guarding or rebound.  No HSM, masses, bruits, hernias.   Rectal: Deferred Musc/Skeltl: No joint redness, swelling or gross deformities. Extremities: No CCE. Neurologic: Oriented x3.  Alert.  Moves all 4 limbs with full strength.  No tremors. Skin: No rashes, no sores, no suspicious lesions Nodes: No cervical adenopathy Psych: Cooperative.  Fluid speech.  Intake/Output from previous day: 05/28 0701 - 05/29 0700 In: 250 [IV Piggyback:250] Out: -  Intake/Output this shift: No intake/output data recorded.  LAB RESULTS: Recent Labs    08/09/20 1135 08/09/20 1218 08/09/20 2016 08/10/20 0218 08/10/20 0222  WBC 7.3  --   --  7.6 7.7  HGB 9.6* 10.5* 8.4* 7.3* 7.2*  HCT 31.1* 31.0*  --  23.7* 23.5*  PLT 273  --   --  223 222   BMET Lab Results  Component Value Date   NA 136 08/10/2020   NA 134 (L) 08/09/2020   NA 137 08/09/2020   K 3.9 08/10/2020   K 4.3 08/09/2020   K 4.3  08/09/2020   CL 96 (L) 08/10/2020   CL 93 (L) 08/09/2020   CL 96 (L) 08/02/2020   CO2 29 08/10/2020   CO2 29 08/09/2020   CO2 26 08/02/2020   GLUCOSE 182 (H) 08/10/2020   GLUCOSE 247 (H) 08/09/2020   GLUCOSE 150 (H) 08/02/2020   BUN 45 (H) 08/10/2020   BUN 41 (H) 08/09/2020   BUN 32 (H) 08/02/2020   CREATININE 7.42 (H) 08/10/2020   CREATININE 5.91 (H) 08/09/2020   CREATININE 7.29 (H) 08/02/2020   CALCIUM 6.8 (L) 08/10/2020   CALCIUM 8.0 (L) 08/09/2020   CALCIUM 7.7 (L) 08/02/2020   LFT Recent Labs    08/09/20 1135 08/10/20 0222  PROT 7.4 5.8*  ALBUMIN 3.8 2.8*  AST 13* 11*  ALT 9 10  ALKPHOS 50 41  BILITOT 0.3 0.5   PT/INR Lab Results  Component Value Date   INR 1.2 02/12/2020   INR 1.2 02/12/2020   INR 1.1 06/19/2019   Hepatitis Panel No results for input(s): HEPBSAG, HCVAB, HEPAIGM, HEPBIGM in the last 72 hours. C-Diff No components found for: CDIFF Lipase     Component Value Date/Time   LIPASE 53 (H) 04/20/2020 1748   COVID-negative  Drugs of Abuse     Component Value Date/Time  LABOPIA POSITIVE (A) 11/11/2017 2050   COCAINSCRNUR NONE DETECTED 11/11/2017 2050   LABBENZ NONE DETECTED 11/11/2017 2050   AMPHETMU NONE DETECTED 11/11/2017 2050   THCU NONE DETECTED 11/11/2017 2050   LABBARB NONE DETECTED 11/11/2017 2050     RADIOLOGY STUDIES: CT ABDOMEN PELVIS WO CONTRAST  Result Date: 08/09/2020 CLINICAL DATA:  Abdominal pain.  Dialysis patient. EXAM: CT ABDOMEN AND PELVIS WITHOUT CONTRAST TECHNIQUE: Multidetector CT imaging of the abdomen and pelvis was performed following the standard protocol without IV contrast. COMPARISON:  04/20/2020. FINDINGS: Lower chest: Lung bases essentially clear. Heart normal in size. Distal esophagus is prominent with wall thickening. Hepatobiliary: Liver normal in size. There are vague areas of hypoattenuation noted along the anterior aspect of the left lobe and anterior dome of the right lobe and more inferiorly in the  right lobe, similar to the most recent prior exam. No defined mass. Gallbladder surgically absent. No bile duct dilation. Pancreas: Unremarkable. No pancreatic ductal dilatation or surrounding inflammatory changes. Spleen: Small focus of calcification along the dome of the spleen, stable. Spleen otherwise normal. Adrenals/Urinary Tract: No adrenal masses. Right kidney not visualized. Atrophic left kidney. No renal masses. No left hydronephrosis. Left ureter normal in course and in caliber. Bladder is decompressed, grossly unremarkable. Stomach/Bowel: Stomach is within normal limits. Appendix appears normal. No evidence of bowel wall thickening, distention, or inflammatory changes. Vascular/Lymphatic: Mild atherosclerotic calcification along the mid to peripheral superior mesenteric artery. Normal appearance of the aorta. Central venous catheter/dialysis catheter has been inserted through the right common femoral vein, tip projecting just below the inferior caval atrial junction, well positioned. No enlarged lymph nodes. Reproductive: Status post hysterectomy. No adnexal masses. Other: Abdominal wall scarring most evident along the right lower quadrant. No hernia. No ascites. Musculoskeletal: No acute or significant osseous findings. IMPRESSION: 1. Circumferential wall thickening of the distal esophagus. Findings raise suspicion for esophagitis. 2. No other evidence of an acute abnormality within the abdomen or pelvis. 3. Vague areas of relative hypoattenuation in the liver, in retrospect similar to the prior CT. Consider follow-up liver MRI without and with contrast for further assessment. 4. Well-positioned dialysis catheter inserted through the right femoral vein. Electronically Signed   By: Lajean Manes M.D.   On: 08/09/2020 13:16      IMPRESSION:   *   Hematemesis in pt w known diabetic gastroparesis and esophagitis. Esophagitis evident on CT Takes PPI, H2 blocker, antacids as well as  Alka-Seltzer. Rule out Mallory-Weiss tear.  Rule out esophagitis, rule out ulcer.  *   Hx gastroparesis  *   Chronic upper abdominal pain.    *   Vague areas of hypoattenuation in liver per CT, stable c/w 04/2020.  LFTs normal.    *   Anemia.  Hgb down ~ 3.5 g in less than 24 hours.    *   IDDM, generally not well controlled.    *    ESRD.  2007 renal transplant, eventually failed and started back on MWF HD in 2019.    Chronic nausea and vomiting from diabetic gastroparesis  PLAN:     *   EGD.  ? Timing?   *   switched to po bid Protonix.     Azucena Freed  08/10/2020, 9:58 AM Phone [phone]  I have reviewed the entire case in detail with the above APP and discussed the plan in detail.  Therefore, I agree with the diagnoses recorded above. In addition,  I have personally interviewed  and examined the patient.  My additional thoughts are as follows:  Acute on chronic nausea and vomiting and upper abdominal pain from diabetic related gastroparesis.  It sounds like she has lately been noncompliant with her insulin.  She is also been using Alka-Seltzer,, some formulations of which contain aspirin.  She initially had a guarded demeanor, but after spending some time together and explaining her medical history including her prior upper endoscopy, she softened her approach and we communicated well.  She had hematemesis on this occasion, which seems to be the first time it is occurred.  It is most likely from gastric or esophageal mucosal tear from retching due to the gastroparesis, also must consider peptic ulcer in a patient who may be taking aspirin and is on hemodialysis.  Her baseline hemoglobin is somewhat difficult to determine on previous lab tests, as they have generally been inpatient labs.  As near as can be determined, the current hemoglobin of 7.5 seems to be down from usual for her.  She also tends to run a low blood pressure.  Last 2 hemoglobin checks were stable, and  nephrology would like her to have a unit of PRBCs prior to discharge. She has no further hematemesis nor has she had melena since admission.  I recommended she undergo upper endoscopy with [place] today (or tomorrow depending on anesthesia's availability). Procedure was described in detail along with risks and benefits and she was agreeable.  The benefits and risks of the planned procedure were described in detail with the patient or (when appropriate) their health care proxy.  Risks were outlined as including, but not limited to, bleeding, infection, perforation, adverse medication reaction leading to cardiac or pulmonary decompensation, pancreatitis (if ERCP).  The limitation of incomplete mucosal visualization was also discussed.  No guarantees or warranties were given.  Patient at increased risk for cardiopulmonary complications of procedure due to medical comorbidities.  This patient's abdominal pain and gastroparesis will continue to be a long-term management challenge due to her complex medical conditions and variable compliance with diabetic treatment.  She also needs to be on long-term acid suppression. Nelida Meuse III [phone]

## 2020-08-10 ENCOUNTER — Encounter (HOSPITAL_COMMUNITY): Payer: Self-pay | Admitting: Internal Medicine

## 2020-08-10 ENCOUNTER — Inpatient Hospital Stay (HOSPITAL_COMMUNITY): Payer: Medicare Other | Admitting: Anesthesiology

## 2020-08-10 ENCOUNTER — Encounter (HOSPITAL_COMMUNITY)
Admission: EM | Disposition: A | Discharge status: Home / Self Care | Payer: Self-pay | Source: Home / Self Care | Attending: Emergency Medicine

## 2020-08-10 ENCOUNTER — Other Ambulatory Visit: Payer: Self-pay

## 2020-08-10 DIAGNOSIS — R1010 Upper abdominal pain, unspecified: Secondary | ICD-10-CM | POA: Diagnosis not present

## 2020-08-10 DIAGNOSIS — K920 Hematemesis: Secondary | ICD-10-CM

## 2020-08-10 DIAGNOSIS — Z992 Dependence on renal dialysis: Secondary | ICD-10-CM | POA: Diagnosis not present

## 2020-08-10 DIAGNOSIS — K922 Gastrointestinal hemorrhage, unspecified: Secondary | ICD-10-CM | POA: Diagnosis not present

## 2020-08-10 DIAGNOSIS — K3184 Gastroparesis: Secondary | ICD-10-CM

## 2020-08-10 DIAGNOSIS — E1022 Type 1 diabetes mellitus with diabetic chronic kidney disease: Secondary | ICD-10-CM | POA: Diagnosis not present

## 2020-08-10 DIAGNOSIS — R112 Nausea with vomiting, unspecified: Secondary | ICD-10-CM | POA: Diagnosis not present

## 2020-08-10 DIAGNOSIS — K209 Esophagitis, unspecified without bleeding: Secondary | ICD-10-CM

## 2020-08-10 DIAGNOSIS — Z20822 Contact with and (suspected) exposure to covid-19: Secondary | ICD-10-CM | POA: Diagnosis not present

## 2020-08-10 DIAGNOSIS — E1143 Type 2 diabetes mellitus with diabetic autonomic (poly)neuropathy: Secondary | ICD-10-CM | POA: Diagnosis not present

## 2020-08-10 DIAGNOSIS — K2100 Gastro-esophageal reflux disease with esophagitis, without bleeding: Secondary | ICD-10-CM | POA: Diagnosis not present

## 2020-08-10 HISTORY — PX: ESOPHAGOGASTRODUODENOSCOPY (EGD) WITH PROPOFOL: SHX5813

## 2020-08-10 LAB — GLUCOSE, CAPILLARY
Glucose-Capillary: 119 mg/dL — ABNORMAL HIGH (ref 70–99)
Glucose-Capillary: 237 mg/dL — ABNORMAL HIGH (ref 70–99)

## 2020-08-10 LAB — COMPREHENSIVE METABOLIC PANEL
ALT: 10 U/L (ref 0–44)
AST: 11 U/L — ABNORMAL LOW (ref 15–41)
Albumin: 2.8 g/dL — ABNORMAL LOW (ref 3.5–5.0)
Alkaline Phosphatase: 41 U/L (ref 38–126)
Anion gap: 11 (ref 5–15)
BUN: 45 mg/dL — ABNORMAL HIGH (ref 6–20)
CO2: 29 mmol/L (ref 22–32)
Calcium: 6.8 mg/dL — ABNORMAL LOW (ref 8.9–10.3)
Chloride: 96 mmol/L — ABNORMAL LOW (ref 98–111)
Creatinine, Ser: 7.42 mg/dL — ABNORMAL HIGH (ref 0.44–1.00)
GFR, Estimated: 7 mL/min — ABNORMAL LOW (ref >60)
Glucose, Bld: 182 mg/dL — ABNORMAL HIGH (ref 70–99)
Potassium: 3.9 mmol/L (ref 3.5–5.1)
Sodium: 136 mmol/L (ref 135–145)
Total Bilirubin: 0.5 mg/dL (ref 0.3–1.2)
Total Protein: 5.8 g/dL — ABNORMAL LOW (ref 6.5–8.1)

## 2020-08-10 LAB — HEMOGLOBIN AND HEMATOCRIT, BLOOD
HCT: 30.5 % — ABNORMAL LOW (ref 36.0–46.0)
Hemoglobin: 9.7 g/dL — ABNORMAL LOW (ref 12.0–15.0)

## 2020-08-10 LAB — CBC WITH DIFFERENTIAL/PLATELET
Abs Immature Granulocytes: 0.02 10*3/uL (ref 0.00–0.07)
Basophils Absolute: 0 10*3/uL (ref 0.0–0.1)
Basophils Relative: 1 %
Eosinophils Absolute: 0.1 10*3/uL (ref 0.0–0.5)
Eosinophils Relative: 1 %
HCT: 23.7 % — ABNORMAL LOW (ref 36.0–46.0)
Hemoglobin: 7.3 g/dL — ABNORMAL LOW (ref 12.0–15.0)
Immature Granulocytes: 0 %
Lymphocytes Relative: 28 %
Lymphs Abs: 2.2 10*3/uL (ref 0.7–4.0)
MCH: 30.5 pg (ref 26.0–34.0)
MCHC: 30.8 g/dL (ref 30.0–36.0)
MCV: 99.2 fL (ref 80.0–100.0)
Monocytes Absolute: 0.8 10*3/uL (ref 0.1–1.0)
Monocytes Relative: 10 %
Neutro Abs: 4.5 10*3/uL (ref 1.7–7.7)
Neutrophils Relative %: 60 %
Platelets: 223 10*3/uL (ref 150–400)
RBC: 2.39 MIL/uL — ABNORMAL LOW (ref 3.87–5.11)
RDW: 16.9 % — ABNORMAL HIGH (ref 11.5–15.5)
WBC: 7.6 10*3/uL (ref 4.0–10.5)
nRBC: 0 % (ref 0.0–0.2)

## 2020-08-10 LAB — CBC
HCT: 23.5 % — ABNORMAL LOW (ref 36.0–46.0)
Hemoglobin: 7.2 g/dL — ABNORMAL LOW (ref 12.0–15.0)
MCH: 30.3 pg (ref 26.0–34.0)
MCHC: 30.6 g/dL (ref 30.0–36.0)
MCV: 98.7 fL (ref 80.0–100.0)
Platelets: 222 10*3/uL (ref 150–400)
RBC: 2.38 MIL/uL — ABNORMAL LOW (ref 3.87–5.11)
RDW: 16.9 % — ABNORMAL HIGH (ref 11.5–15.5)
WBC: 7.7 10*3/uL (ref 4.0–10.5)
nRBC: 0 % (ref 0.0–0.2)

## 2020-08-10 LAB — PHOSPHORUS: Phosphorus: 4.1 mg/dL (ref 2.5–4.6)

## 2020-08-10 LAB — FERRITIN: Ferritin: 1099 ng/mL — ABNORMAL HIGH (ref 11–307)

## 2020-08-10 LAB — IRON AND TIBC
Iron: 43 ug/dL (ref 28–170)
Saturation Ratios: 26 % (ref 10.4–31.8)
TIBC: 162 ug/dL — ABNORMAL LOW (ref 250–450)
UIBC: 119 ug/dL

## 2020-08-10 LAB — BETA-HYDROXYBUTYRIC ACID: Beta-Hydroxybutyric Acid: 0.46 mmol/L — ABNORMAL HIGH (ref 0.05–0.27)

## 2020-08-10 LAB — MAGNESIUM: Magnesium: 2 mg/dL (ref 1.7–2.4)

## 2020-08-10 LAB — PREPARE RBC (CROSSMATCH)

## 2020-08-10 SURGERY — ESOPHAGOGASTRODUODENOSCOPY (EGD) WITH PROPOFOL
Anesthesia: Monitor Anesthesia Care

## 2020-08-10 MED ORDER — METOPROLOL TARTRATE 5 MG/5ML IV SOLN
INTRAVENOUS | Status: AC
  Filled 2020-08-10: qty 5

## 2020-08-10 MED ORDER — SODIUM CHLORIDE 0.9% IV SOLUTION: Freq: Once | INTRAVENOUS | Status: AC

## 2020-08-10 MED ORDER — PROPOFOL 500 MG/50ML IV EMUL
INTRAVENOUS | Status: DC | PRN
  Administered 2020-08-10: 100 ug/kg/min via INTRAVENOUS

## 2020-08-10 MED ORDER — SODIUM CHLORIDE 0.9 % IV SOLN: INTRAVENOUS | Status: DC | PRN

## 2020-08-10 MED ORDER — PANTOPRAZOLE SODIUM 40 MG PO TBEC: 40.0000 mg | DELAYED_RELEASE_TABLET | Freq: Two times a day (BID) | ORAL | Status: DC

## 2020-08-10 MED ORDER — PANTOPRAZOLE SODIUM 40 MG PO TBEC: 40.0000 mg | DELAYED_RELEASE_TABLET | Freq: Two times a day (BID) | ORAL | 2 refills | Status: DC

## 2020-08-10 MED ORDER — LIDOCAINE 2% (20 MG/ML) 5 ML SYRINGE
INTRAMUSCULAR | Status: DC | PRN
  Administered 2020-08-10: 60 mg via INTRAVENOUS

## 2020-08-10 MED ORDER — PROPOFOL 10 MG/ML IV BOLUS
INTRAVENOUS | Status: DC | PRN
  Administered 2020-08-10: 20 mg via INTRAVENOUS

## 2020-08-10 MED ORDER — BD PEN NEEDLE NANO 2ND GEN 32G X 4 MM MISC: 1.0000 | Freq: Four times a day (QID) | 2 refills | Status: DC

## 2020-08-10 SURGICAL SUPPLY — 15 items

## 2020-08-10 NOTE — Progress Notes (Signed)
Hal Hope, MD at bedside.   Pt to remain NPO.

## 2020-08-10 NOTE — Anesthesia Postprocedure Evaluation (Signed)
Anesthesia Post Note  Patient: [name]  Procedure(s) Performed: ESOPHAGOGASTRODUODENOSCOPY (EGD) WITH PROPOFOL (N/A )     Patient location during evaluation: PACU Anesthesia Type: MAC Level of consciousness: awake and alert Pain management: pain level controlled Vital Signs Assessment: post-procedure vital signs reviewed and stable Respiratory status: spontaneous breathing, nonlabored ventilation and respiratory function stable Cardiovascular status: stable and blood pressure returned to baseline Anesthetic complications: no   No complications documented.  Last Vitals:  Vitals:   08/10/20 1426 08/10/20 1438  BP: 125/73 124/84  Pulse: 80 86  Resp: 18 19  Temp: 36.7 C 36.6 C  SpO2: 97% 100%    Last Pain:  Vitals:   08/10/20 1438  TempSrc: Oral  PainSc: 0-No pain                 [name]

## 2020-08-10 NOTE — Op Note (Signed)
[facility] Patient Name: [name] Procedure Date : 08/10/2020 MRN: [id number] Attending MD: Estill Cotta. Loletha Carrow , MD Date of Birth: [birth year] CSN: [id number] Age: 35 Admit Type: Inpatient Procedure:                Upper GI endoscopy Indications:              Upper abdominal pain, Hematemesis, Nausea with                            vomiting                           ESRD on HD, DM with gastroparesis Providers:                Mallie Mussel L. Loletha Carrow, MD, Jeanella Cara, RN,                            Tyrone Apple, Technician, Clearnce Sorrel, CRNA Referring MD:             Medical Teaching Service Medicines:                Monitored Anesthesia Care Complications:            No immediate complications. Estimated Blood Loss:     Estimated blood loss: none. Procedure:                Pre-Anesthesia Assessment:                           - Prior to the procedure, a History and Physical                            was performed, and patient medications and                            allergies were reviewed. The patient's tolerance of                            previous anesthesia was also reviewed. The risks                            and benefits of the procedure and the sedation                            options and risks were discussed with the patient.                            All questions were answered, and informed consent                            was obtained. Prior Anticoagulants: The patient has                            taken no previous anticoagulant or antiplatelet  agents. ASA Grade Assessment: IV - A patient with                            severe systemic disease that is a constant threat                            to life. After reviewing the risks and benefits,                            the patient was deemed in satisfactory condition to                            undergo the procedure.                           After obtaining  informed consent, the endoscope was                            passed under direct vision. Throughout the                            procedure, the patient's blood pressure, pulse, and                            oxygen saturations were monitored continuously. The                            [id number] Olympus gastroscope was                            introduced through the mouth, and advanced to the                            second part of duodenum. The upper GI endoscopy was                            accomplished without difficulty. The patient                            tolerated the procedure. Scope In: Scope Out: Findings:      LA Grade D (one or more mucosal breaks involving at least 75% of       esophageal circumference) esophagitis with no bleeding was found 25 to       39 cm from the incisors. From acute on chronic vomiting.      Localized severely erythematous mucosa without bleeding was found in the       gastric fundus. From vomiting. A few patchy areas of mucosal friability       in the stomach.      The exam of the stomach was otherwise normal.      The examined duodenum was normal. Impression:               - LA Grade D reflux esophagitis with no bleeding.                           -  Erythematous mucosa in the gastric fundus.                           - Normal examined duodenum.                           - No specimens collected. Recommendation:           - Return patient to hospital ward for ongoing care.                           - Diabetic (ADA) diet.                           - Twice daily oral PPI                           Improved glucose control.                           regular use of metoclopramide                           Dr. Carlean Purl available to see as needed in GI clinic.                           Inpatient GI service signing off - call as needed. Procedure Code(s):        --- Professional ---                           [phone],  Esophagogastroduodenoscopy, flexible,                            transoral; diagnostic, including collection of                            specimen(s) by brushing or washing, when performed                            (separate procedure) Diagnosis Code(s):        --- Professional ---                           K21.00, Gastro-esophageal reflux disease with                            esophagitis, without bleeding                           K31.89, Other diseases of stomach and duodenum                           R10.10, Upper abdominal pain, unspecified                           K92.0, Hematemesis  R11.2, Nausea with vomiting, unspecified CPT copyright 2019 American [employer] Association. All rights reserved. The codes documented in this report are preliminary and upon coder review may  be revised to meet current compliance requirements. [name] L. Loletha Carrow, MD 08/10/2020 2:17:06 PM This report has been signed electronically. Number of Addenda: 0

## 2020-08-10 NOTE — Discharge Summary (Signed)
Physician Discharge Summary  [name] [id number] DOB: 20-Jul-1985 DOA: 08/09/2020  PCP: [name], MD  Admit date: 08/09/2020 Discharge date: 08/10/2020  Admitted From: Home Disposition: Home  Recommendations for Outpatient Follow-up:  1. Follow up with PCP in 1-2 weeks 2. Please obtain BMP/CBC in one week 3. Go to your dialysis center tomorrow morning.  Home Health: Not applicable Equipment/Devices: Not applicable  Discharge Condition: Stable CODE STATUS: Full code Diet recommendation: Low-salt and low-carb diet.  Gastroparesis diet.  Small frequent meals.  Discharge summary: 35 year old female with ESRD on hemodialysis Monday Wednesday Friday, failed renal transplant, diabetes with neuropathy and gastroparesis, hypertension, triple X syndrome and pseudoseizures presented to the emergency room with multiple episodes of hematemesis.  At the emergency room, she was hemodynamically stable except tachycardia with heart rate of 122.  Blood pressures were stable.  Hemoglobin was 9.  CT scan abdomen pelvis showed lower esophageal thickening.  Patient was admitted to the hospital with acute upper GI bleeding, started on Protonix infusion and seen by gastroenterology.  Patient was stabilized with fluid, her hemoglobin gradually dropped to 7.2 and received 1 unit of PRBC.  Subsequently underwent upper GI endoscopy and found to have severe esophagitis with no active bleeding.  Has underlying history of gastroparesis and frequent exacerbations.  After the procedure, repeat hemoglobin was 9.  Patient was a stable.  She had been walking in the hallway.  Goes to hemodialysis early morning so wanted to go home.  Since patient was stable and has scheduled hemodialysis in the morning, she was discharged home.  She will resume all her home medications.  She is already on PPI twice a day.  Recently titrated doses of insulin.  She has Reglan that she uses scheduled.   With rapid resuscitation, blood  transfusions and able to schedule procedure same day, patient was stabilized and was able to be discharged home in less than 2 midnights.  Discharge Diagnoses:  Active Problems:   Anemia due to chronic kidney disease   Essential hypertension, benign   Type 1 diabetes mellitus with complication, uncontrolled (HCC)   ESRD on hemodialysis (HCC)   Acute upper GI bleed   Esophagitis    Discharge Instructions  Discharge Instructions    Call MD for:  persistant dizziness or light-headedness   Complete by: As directed    Call MD for:  persistant nausea and vomiting   Complete by: As directed    Diet - low sodium heart healthy   Complete by: As directed    Diet Carb Modified   Complete by: As directed    Increase activity slowly   Complete by: As directed      Allergies as of 08/10/2020      Reactions   Diphenhydramine Anaphylaxis   Doxycycline Shortness Of Breath   Motrin [ibuprofen] Shortness Of Breath, Itching   Shellfish Allergy Hives   Banana Itching, Nausea And Vomiting, Other (See Comments)   Sick on the stomach   Chlorhexidine Itching   Ferrous Sulfate Itching   Iron Dextran Itching, Other (See Comments)   Vein irritation      Medication List    STOP taking these medications   amLODipine 10 MG tablet Commonly known as: NORVASC   famotidine 40 MG tablet Commonly known as: PEPCID     TAKE these medications   Accu-Chek Softclix Lancets lancets Use to check blood sugar 4 times per day. What changed:   how much to take  how to take this  when to take this  additional instructions   acetaminophen 500 MG tablet Commonly known as: TYLENOL Take 1,000 mg by mouth every 6 (six) hours as needed for headache (pain).   BD Pen Needle Nano 2nd Gen 32G X 4 MM Misc Generic drug: Insulin Pen Needle 1 each by Does not apply route in the morning, at noon, in the evening, and at bedtime.   calcium carbonate (dosed in mg elemental calcium) 1250 MG/5ML Susp Take 5 mLs  by mouth 3 (three) times daily.   EQ MULTIVITAMINS ADULT GUMMY PO Take 2 tablets by mouth daily.   fluticasone 50 MCG/ACT nasal spray Commonly known as: FLONASE Place 2 sprays into both nostrils daily as needed for allergies.   hydrOXYzine 50 MG tablet Commonly known as: ATARAX/VISTARIL Take 1 tablet (50 mg total) by mouth 2 (two) times daily as needed for itching.   insulin aspart 100 UNIT/ML FlexPen Commonly known as: NOVOLOG Inject 2-10 Units into the skin 3 (three) times daily with meals.   insulin degludec 100 UNIT/ML FlexTouch Pen Commonly known as: TRESIBA Inject 20 Units into the skin daily.   loperamide 1 MG/5ML solution Commonly known as: IMODIUM Take 2 mg by mouth 2 (two) times daily as needed for diarrhea or loose stools.   metoCLOPramide 5 MG tablet Commonly known as: REGLAN Take 2 tablets (10 mg total) by mouth 2 (two) times daily.   metoprolol tartrate 25 MG tablet Commonly known as: LOPRESSOR Take 1 tablet (25 mg total) by mouth 2 (two) times daily.   nystatin 100000 UNIT/ML suspension Commonly known as: MYCOSTATIN Take 5 mLs by mouth 4 (four) times daily.   pantoprazole 40 MG tablet Commonly known as: PROTONIX Take 1 tablet (40 mg total) by mouth 2 (two) times daily.   promethazine 25 MG tablet Commonly known as: PHENERGAN Take 25 mg by mouth every 6 (six) hours as needed for nausea or vomiting.   simvastatin 20 MG tablet Commonly known as: ZOCOR Take 20 mg by mouth at bedtime.       Allergies  Allergen Reactions  . Diphenhydramine Anaphylaxis  . Doxycycline Shortness Of Breath  . Motrin [Ibuprofen] Shortness Of Breath and Itching  . Shellfish Allergy Hives  . Banana Itching, Nausea And Vomiting and Other (See Comments)    Sick on the stomach  . Chlorhexidine Itching  . Ferrous Sulfate Itching  . Iron Dextran Itching and Other (See Comments)    Vein irritation      Consultations:  Nephrology  Gastroenterology   Procedures/Studies: CT ABDOMEN PELVIS WO CONTRAST  Result Date: 08/09/2020 CLINICAL DATA:  Abdominal pain.  Dialysis patient. EXAM: CT ABDOMEN AND PELVIS WITHOUT CONTRAST TECHNIQUE: Multidetector CT imaging of the abdomen and pelvis was performed following the standard protocol without IV contrast. COMPARISON:  04/20/2020. FINDINGS: Lower chest: Lung bases essentially clear. Heart normal in size. Distal esophagus is prominent with wall thickening. Hepatobiliary: Liver normal in size. There are vague areas of hypoattenuation noted along the anterior aspect of the left lobe and anterior dome of the right lobe and more inferiorly in the right lobe, similar to the most recent prior exam. No defined mass. Gallbladder surgically absent. No bile duct dilation. Pancreas: Unremarkable. No pancreatic ductal dilatation or surrounding inflammatory changes. Spleen: Small focus of calcification along the dome of the spleen, stable. Spleen otherwise normal. Adrenals/Urinary Tract: No adrenal masses. Right kidney not visualized. Atrophic left kidney. No renal masses. No left hydronephrosis. Left ureter normal in course and in caliber. Bladder  is decompressed, grossly unremarkable. Stomach/Bowel: Stomach is within normal limits. Appendix appears normal. No evidence of bowel wall thickening, distention, or inflammatory changes. Vascular/Lymphatic: Mild atherosclerotic calcification along the mid to peripheral superior mesenteric artery. Normal appearance of the aorta. Central venous catheter/dialysis catheter has been inserted through the right common femoral vein, tip projecting just below the inferior caval atrial junction, well positioned. No enlarged lymph nodes. Reproductive: Status post hysterectomy. No adnexal masses. Other: Abdominal wall scarring most evident along the right lower quadrant. No hernia. No ascites. Musculoskeletal: No acute or significant osseous  findings. IMPRESSION: 1. Circumferential wall thickening of the distal esophagus. Findings raise suspicion for esophagitis. 2. No other evidence of an acute abnormality within the abdomen or pelvis. 3. Vague areas of relative hypoattenuation in the liver, in retrospect similar to the prior CT. Consider follow-up liver MRI without and with contrast for further assessment. 4. Well-positioned dialysis catheter inserted through the right femoral vein. Electronically Signed   By: Lajean Manes M.D.   On: 08/09/2020 13:16   DG Chest 2 View  Result Date: 07/31/2020 CLINICAL DATA:  35 year old female with sharp chest pain for 2 days, nausea. Dialysis patient. EXAM: CHEST - 2 VIEW COMPARISON:  Chest radiographs 04/20/2020 and earlier. FINDINGS: Mildly larger lung volumes. Stable cardiac size and mediastinal contours. Heart size chronically at the upper limits of normal. Inferior approach dialysis type catheter tip projects at the inferior cavoatrial junction as before. Tracheal air column is within normal limits. No pneumothorax, pulmonary edema, pleural effusion or confluent pulmonary opacity. No osseous abnormality identified. Stable cholecystectomy clips. Negative visible bowel gas pattern. IMPRESSION: No acute cardiopulmonary abnormality. Electronically Signed   By: Genevie [name] M.D.   On: 07/31/2020 11:30   (Echo, Carotid, EGD, Colonoscopy, ERCP)    Subjective: Patient seen and examined in the evening.  I went to examine her.  She was walking outside her room and looking for someone to ask to go home.  She ate normal diet after coming back from endoscopy and has no problem. Tomorrow is her birthday, she wants to go to dialysis early morning and then spend her birthday with her family and is wanting to go home.    Discharge Exam: Vitals:   08/10/20 1438 08/10/20 1651  BP: 124/84 (!) 143/85  Pulse: 86 100  Resp: 19 18  Temp: 97.8 F (36.6 C) 97.6 F (36.4 C)  SpO2: 100% 100%   Vitals:   08/10/20 1411  08/10/20 1426 08/10/20 1438 08/10/20 1651  BP: 108/61 125/73 124/84 (!) 143/85  Pulse: 87 80 86 100  Resp: '14 18 19 18  '$ Temp: (!) 97.5 F (36.4 C) 98.1 F (36.7 C) 97.8 F (36.6 C) 97.6 F (36.4 C)  TempSrc: Temporal  Oral Oral  SpO2: 92% 97% 100% 100%  Height:        General: Pt is alert, awake, not in acute distress On room air.  Walking around. Cardiovascular: RRR, S1/S2 +, no rubs, no gallops Respiratory: CTA bilaterally, no wheezing, no rhonchi Abdominal: Soft, NT, ND, bowel sounds + Extremities: no edema, no cyanosis    The results of significant diagnostics from this hospitalization (including imaging, microbiology, ancillary and laboratory) are listed below for reference.     Microbiology: Recent Results (from the past 240 hour(s))  Resp Panel by RT-PCR (Flu A&B, Covid) Nasopharyngeal Swab     Status: None   Collection Time: 08/09/20  1:50 PM   Specimen: Nasopharyngeal Swab; Nasopharyngeal(NP) swabs in vial transport medium  Result  Value Ref Range Status   SARS Coronavirus 2 by RT PCR NEGATIVE NEGATIVE Final    Comment: (NOTE) SARS-CoV-2 target nucleic acids are NOT DETECTED.  The SARS-CoV-2 RNA is generally detectable in upper respiratory specimens during the acute phase of infection. The lowest concentration of SARS-CoV-2 viral copies this assay can detect is 138 copies/mL. A negative result does not preclude SARS-Cov-2 infection and should not be used as the sole basis for treatment or other patient management decisions. A negative result may occur with  improper specimen collection/handling, submission of specimen other than nasopharyngeal swab, presence of viral mutation(s) within the areas targeted by this assay, and inadequate number of viral copies(<138 copies/mL). A negative result must be combined with clinical observations, patient history, and epidemiological information. The expected result is Negative.  Fact Sheet for Patients:   EntrepreneurPulse.com.au  Fact Sheet for Healthcare Providers:  IncredibleEmployment.be  This test is no t yet approved or cleared by the [place] FDA and  has been authorized for detection and/or diagnosis of SARS-CoV-2 by FDA under an Emergency Use Authorization (EUA). This EUA will remain  in effect (meaning this test can be used) for the duration of the COVID-19 declaration under Section 564(b)(1) of the Act, 21 U.S.C.section 360bbb-3(b)(1), unless the authorization is terminated  or revoked sooner.       Influenza A by PCR NEGATIVE NEGATIVE Final   Influenza B by PCR NEGATIVE NEGATIVE Final    Comment: (NOTE) The Xpert Xpress SARS-CoV-2/FLU/RSV plus assay is intended as an aid in the diagnosis of influenza from Nasopharyngeal swab specimens and should not be used as a sole basis for treatment. Nasal washings and aspirates are unacceptable for Xpert Xpress SARS-CoV-2/FLU/RSV testing.  Fact Sheet for Patients: EntrepreneurPulse.com.au  Fact Sheet for Healthcare Providers: IncredibleEmployment.be  This test is not yet approved or cleared by the [place] FDA and has been authorized for detection and/or diagnosis of SARS-CoV-2 by FDA under an Emergency Use Authorization (EUA). This EUA will remain in effect (meaning this test can be used) for the duration of the COVID-19 declaration under Section 564(b)(1) of the Act, 21 U.S.C. section 360bbb-3(b)(1), unless the authorization is terminated or revoked.  Performed at [facility], [address], [place], [place] [address]      Labs: BNP (last 3 results) No results for input(s): BNP in the last 8760 hours. Basic Metabolic Panel: Recent Labs  Lab 08/09/20 1135 08/09/20 1218 08/10/20 0222  NA 137 134* 136  K 4.3 4.3 3.9  CL 93*  --  96*  CO2 29  --  29  GLUCOSE 247*  --  182*  BUN 41*  --  45*  CREATININE 5.91*   --  7.42*  CALCIUM 8.0*  --  6.8*  MG  --   --  2.0  PHOS  --   --  4.1   Liver Function Tests: Recent Labs  Lab 08/09/20 1135 08/10/20 0222  AST 13* 11*  ALT 9 10  ALKPHOS 50 41  BILITOT 0.3 0.5  PROT 7.4 5.8*  ALBUMIN 3.8 2.8*   No results for input(s): LIPASE, AMYLASE in the last 168 hours. No results for input(s): AMMONIA in the last 168 hours. CBC: Recent Labs  Lab 08/09/20 1135 08/09/20 1218 08/09/20 2016 08/10/20 0218 08/10/20 0222 08/10/20 1632  WBC 7.3  --   --  7.6 7.7  --   NEUTROABS 4.7  --   --  4.5  --   --   HGB  9.6* 10.5* 8.4* 7.3* 7.2* 9.7*  HCT 31.1* 31.0*  --  23.7* 23.5* 30.5*  MCV 97.8  --   --  99.2 98.7  --   PLT 273  --   --  223 222  --    Cardiac Enzymes: No results for input(s): CKTOTAL, CKMB, CKMBINDEX, TROPONINI in the last 168 hours. BNP: Invalid input(s): POCBNP CBG: Recent Labs  Lab 08/09/20 1134 08/09/20 1622 08/09/20 2133 08/10/20 1412 08/10/20 1642  GLUCAP 251* 231* 193* 119* 237*   D-Dimer No results for input(s): DDIMER in the last 72 hours. Hgb A1c No results for input(s): HGBA1C in the last 72 hours. Lipid Profile No results for input(s): CHOL, HDL, LDLCALC, TRIG, CHOLHDL, LDLDIRECT in the last 72 hours. Thyroid function studies No results for input(s): TSH, T4TOTAL, T3FREE, THYROIDAB in the last 72 hours.  Invalid input(s): FREET3 Anemia work up Recent Labs    08/10/20 0222  FERRITIN 1,099*  TIBC 162*  IRON 43   Urinalysis    Component Value Date/Time   COLORURINE RED (A) 09/03/2018 1118   APPEARANCEUR CLOUDY (A) 09/03/2018 1118   LABSPEC 1.015 09/03/2018 1118   PHURINE 8.0 09/03/2018 1118   GLUCOSEU 250 (A) 09/03/2018 1118   HGBUR LARGE (A) 09/03/2018 1118   HGBUR negative 01/26/2010 1026   BILIRUBINUR NEGATIVE 09/03/2018 1118   BILIRUBINUR NEG 01/01/2011 [place] 09/03/2018 1118   PROTEINUR >300 (A) 09/03/2018 1118   UROBILINOGEN 0.2 01/09/2015 1333   NITRITE POSITIVE (A)  09/03/2018 1118   LEUKOCYTESUR TRACE (A) 09/03/2018 1118   Sepsis Labs Invalid input(s): PROCALCITONIN,  WBC,  LACTICIDVEN Microbiology Recent Results (from the past 240 hour(s))  Resp Panel by RT-PCR (Flu A&B, Covid) Nasopharyngeal Swab     Status: None   Collection Time: 08/09/20  1:50 PM   Specimen: Nasopharyngeal Swab; Nasopharyngeal(NP) swabs in vial transport medium  Result Value Ref Range Status   SARS Coronavirus 2 by RT PCR NEGATIVE NEGATIVE Final    Comment: (NOTE) SARS-CoV-2 target nucleic acids are NOT DETECTED.  The SARS-CoV-2 RNA is generally detectable in upper respiratory specimens during the acute phase of infection. The lowest concentration of SARS-CoV-2 viral copies this assay can detect is 138 copies/mL. A negative result does not preclude SARS-Cov-2 infection and should not be used as the sole basis for treatment or other patient management decisions. A negative result may occur with  improper specimen collection/handling, submission of specimen other than nasopharyngeal swab, presence of viral mutation(s) within the areas targeted by this assay, and inadequate number of viral copies(<138 copies/mL). A negative result must be combined with clinical observations, patient history, and epidemiological information. The expected result is Negative.  Fact Sheet for Patients:  EntrepreneurPulse.com.au  Fact Sheet for Healthcare Providers:  IncredibleEmployment.be  This test is no t yet approved or cleared by the [place] FDA and  has been authorized for detection and/or diagnosis of SARS-CoV-2 by FDA under an Emergency Use Authorization (EUA). This EUA will remain  in effect (meaning this test can be used) for the duration of the COVID-19 declaration under Section 564(b)(1) of the Act, 21 U.S.C.section 360bbb-3(b)(1), unless the authorization is terminated  or revoked sooner.       Influenza A by PCR NEGATIVE NEGATIVE  Final   Influenza B by PCR NEGATIVE NEGATIVE Final    Comment: (NOTE) The Xpert Xpress SARS-CoV-2/FLU/RSV plus assay is intended as an aid in the diagnosis of influenza from Nasopharyngeal swab specimens and should not be  used as a sole basis for treatment. Nasal washings and aspirates are unacceptable for Xpert Xpress SARS-CoV-2/FLU/RSV testing.  Fact Sheet for Patients: EntrepreneurPulse.com.au  Fact Sheet for Healthcare Providers: IncredibleEmployment.be  This test is not yet approved or cleared by the [place] FDA and has been authorized for detection and/or diagnosis of SARS-CoV-2 by FDA under an Emergency Use Authorization (EUA). This EUA will remain in effect (meaning this test can be used) for the duration of the COVID-19 declaration under Section 564(b)(1) of the Act, 21 U.S.C. section 360bbb-3(b)(1), unless the authorization is terminated or revoked.  Performed at [facility], [address], [place], [place] [address]      Time coordinating discharge:  32 minutes  SIGNED:   Barb Merino, MD  [facility] 08/11/2020, 7:41 AM

## 2020-08-10 NOTE — Care Management Obs Status (Signed)
[facility] NOTIFICATION   Patient Details  Name: [name] MRN: [id number] Date of Birth: 1985/04/12   Medicare Observation Status Notification Given:  Yes    Claudie Leach, RN 08/10/2020, 7:25 PM

## 2020-08-10 NOTE — Interval H&P Note (Signed)
History and Physical Interval Note:  08/10/2020 1:27 PM  [name]  has presented today for surgery, with the diagnosis of Limited hematemesis.  History esophagitis.  History gastroparesis.  Chronic upper abdominal pain despite antacids, H2 blockers and PPI.Marland Kitchen  The various methods of treatment have been discussed with the patient and family. After consideration of risks, benefits and other options for treatment, the patient has consented to  Procedure(s): ESOPHAGOGASTRODUODENOSCOPY (EGD) WITH PROPOFOL (N/A) as a surgical intervention.  The patient's history has been reviewed, patient examined, no change in status, stable for surgery.  I have reviewed the patient's chart and labs.  Questions were answered to the patient's satisfaction.    I was asked by the endoscopy nursing to evaluate this patient in the preprocedure area due to tachycardia. She was reportedly at a heart rate in the 70s when first on the monitor, then was noted to jump to the 170s.  Patient denies chest pain or dyspnea.  However, when looking on the monitor, it did not appear to truly be in the 170s, and the monitor seems to be picking up the T wave's QRS. Radial pulse checked, and is between 90 and 100.  Currently getting 1 unit PRBCs.  Nelida Meuse III

## 2020-08-10 NOTE — Progress Notes (Signed)
Pt refusing IV fluids, states "I'm suppose to be getting dialysis and yall keep filling me up with all these fluids because yall can't get it right. I need some food and something to drink, and get me on the phone with the doctor so I can talk to him myself."  IV fluids turned off per the patient request. Hal Hope, MD paged. States he will be at bedside within the next hour two.  Will continue to follow current care plan.

## 2020-08-10 NOTE — Care Management CC44 (Signed)
Condition Code 44 Documentation Completed  Patient Details  Name: LEIAH HOURANI MRN: [id number] Date of Birth: [birth year]   Condition Code 44 given:  Yes Patient signature on Condition Code 44 notice:  Yes Documentation of 2 MD's agreement:  Yes Code 44 added to claim:  Yes    Claudie Leach, RN 08/10/2020, 7:25 PM

## 2020-08-10 NOTE — Progress Notes (Signed)
Discharge instructions provided to patient. All medications, follow up appointments, and discharge instructions provided. IV out. Monitor off CCMD notified. Discharging home.  Era Bumpers, RN

## 2020-08-10 NOTE — Transfer of Care (Signed)
Immediate Anesthesia Transfer of Care Note  Patient: [name]  Procedure(s) Performed: ESOPHAGOGASTRODUODENOSCOPY (EGD) WITH PROPOFOL (N/A )  Patient Location: PACU  Anesthesia Type:MAC  Level of Consciousness: awake, alert  and oriented  Airway & Oxygen Therapy: Patient Spontanous Breathing  Post-op Assessment: Report given to RN and Post -op Vital signs reviewed and stable  Post vital signs: Reviewed and stable  Last Vitals:  Vitals Value Taken Time  BP 108/61 08/10/20 1411  Temp 36.4 C 08/10/20 1411  Pulse 85 08/10/20 1416  Resp 13 08/10/20 1416  SpO2 97 % 08/10/20 1416  Vitals shown include unvalidated device data.  Last Pain:  Vitals:   08/10/20 1411  TempSrc:   PainSc: 0-No pain         Complications: No complications documented.

## 2020-08-10 NOTE — Progress Notes (Signed)
[facility] Progress Note  Name: [name] MRN: [id number] DOB: 12-09-1985  Chief Complaint:  "threw up blood"  Subjective:  got fluids (1.25 liters ordered?) overnight for hypotension per charting.  Patient then refused at least some of the fluids citing that she is a dialysis patient.  We discussed risks/benefits/indications for blood transfusion and she does agree to blood transfusion.   Review of systems:  Denies n/v Denies shortness of breath or chest pain  States no further episodes here of hematesis -------- Background on consult:  [name] is a 35 y.o. female with a history of end-stage renal disease on hemodialysis Monday Wednesday Friday at [facility] kidney center, failed renal transplant, diabetes with neuropathy, gastroparesis, hypertension, XXX syndrome and pseudoseizures who presented to the ER with multiple episodes of hematemesis.  States this hasn't happened before and that she has never had an EGD.  Had upper endoscopy 2019 per [facility] system for indication of hematemesis with moderate reflux esophagitis noted.  She states that Note recent admission with abd pain/n/v and hx of uncontrolled hyperglycemia - she had moved and stated that she had packed up her insulin; she was discharged on 5/21.  I can see charting that she went to HD on 5/25 and she tells me she went on 5/27 as well (though charting either not available yet or she didn't go).    Intake/Output Summary (Last 24 hours) at 08/10/2020 0933 Last data filed at 08/09/2020 1446 Gross per 24 hour  Intake 250 ml  Output --  Net 250 ml    Vitals:  Vitals:   08/10/20 0222 08/10/20 0402 08/10/20 0600 08/10/20 0820  BP: (!) 86/48 (!) 96/59 103/62 (!) 124/53  Pulse: (!) 101 (!) 104  90  Resp:  14  17  Temp:  98.6 F (37 C)  98.3 F (36.8 C)  TempSrc:  Oral  Oral  SpO2:    96%     Physical Exam:  General adult female in bed in no acute distress HEENT normocephalic atraumatic  extraocular movements intact sclera anicteric Neck supple trachea midline Lungs clear to auscultation bilaterally normal work of breathing at rest on room air Heart S1S2 no rub Abdomen soft nontender nondistended Extremities no edema appreciated; no cyanosis or clubbing Psych no anxiety or agitation Neuro alert and oriented x3 follows commands Access:  Right femoral HD catheter in place   Medications reviewed   Labs:  BMP Latest Ref Rng & Units 08/10/2020 08/09/2020 08/09/2020  Glucose 70 - 99 mg/dL 182(H) - 247(H)  BUN 6 - 20 mg/dL 45(H) - 41(H)  Creatinine 0.44 - 1.00 mg/dL 7.42(H) - 5.91(H)  BUN/Creat Ratio 6 - 22 (calc) - - -  Sodium 135 - 145 mmol/L 136 134(L) 137  Potassium 3.5 - 5.1 mmol/L 3.9 4.3 4.3  Chloride 98 - 111 mmol/L 96(L) - 93(L)  CO2 22 - 32 mmol/L 29 - 29  Calcium 8.9 - 10.3 mg/dL 6.8(L) - 8.0(L)   Outpatient HD orders:  [facility] 4 hours 2K/3.5 Ca bath  BF 350 DF 500  EDW 79.5 kg Tunneled catheter Meds:  venofer 100 mg weekly; mircera 100 mcg every 2 weeks; last got mircera 150 mcg on 08/04/20.  08/06/20 Hb 9.2.  No senispar or activated vit D ordered in-center Last tx that is charted is on 5/25 with post weight of 80.7 kg; she states went 5/27 as well    Assessment/Plan:   # ESRD  - HD per MWF schedule  -  Next tx on 5/30 per MWF schedule  # GI bleed- hematemesis - GI is consulted   - Anemia management as below  # HTN  - Note last admission there was discussion of possible midodrine.  - BP improved now but got fluids overnight   # Anemia CKD as well as acute blood loss - note recently dosed ESA outpatient - mircera 150 mcg on 08/04/20 as above - PRBC's today   # Secondary hyperparathyroidism - not on activated vit D outpatient; PTH low  Disposition per primary team   Claudia Desanctis, MD 08/10/2020 9:46 AM

## 2020-08-10 NOTE — Anesthesia Preprocedure Evaluation (Addendum)
Anesthesia Evaluation  Patient identified by MRN, date of birth, ID band Patient awake    Reviewed: Allergy & Precautions, NPO status , Patient's Chart, lab work & pertinent test results  History of Anesthesia Complications Negative for: history of anesthetic complications  Airway Mallampati: II  TM Distance: >3 FB Neck ROM: Full    Dental  (+) Dental Advisory Given   Pulmonary neg pulmonary ROS,    Pulmonary exam normal        Cardiovascular hypertension, Pt. on medications and Pt. on home beta blockers Normal cardiovascular exam     Neuro/Psych Seizures - (pseudoseizures),   Neuromuscular disease (neuropathy) negative psych ROS   GI/Hepatic Neg liver ROS, hiatal hernia, GERD  Controlled,  Endo/Other  negative endocrine ROSdiabetes, Insulin Dependent  Renal/GU ESRF and DialysisRenal disease (s/p renal transplant)     Musculoskeletal negative musculoskeletal ROS (+)   Abdominal   Peds  Hematology negative hematology ROS (+)   Anesthesia Other Findings XXX syndrome with learning disability Covid test negative   Reproductive/Obstetrics                            Anesthesia Physical Anesthesia Plan  ASA: IV  Anesthesia Plan: MAC   Post-op Pain Management:    Induction: Intravenous  PONV Risk Score and Plan: 2 and Propofol infusion and Treatment may vary due to age or medical condition  Airway Management Planned: Nasal Cannula and Natural Airway  Additional Equipment: None  Intra-op Plan:   Post-operative Plan:   Informed Consent: I have reviewed the patients History and Physical, chart, labs and discussed the procedure including the risks, benefits and alternatives for the proposed anesthesia with the patient or authorized representative who has indicated his/her understanding and acceptance.       Plan Discussed with: CRNA and Anesthesiologist  Anesthesia Plan Comments:         Anesthesia Quick Evaluation

## 2020-08-11 ENCOUNTER — Encounter (HOSPITAL_COMMUNITY): Payer: Self-pay | Admitting: Gastroenterology

## 2020-08-11 LAB — TYPE AND SCREEN
ABO/RH(D): A POS
Antibody Screen: NEGATIVE
Unit division: 0

## 2020-08-11 LAB — BPAM RBC
Blood Product Expiration Date: 202206022359
ISSUE DATE / TIME: 202205291144
Unit Type and Rh: 6200

## 2020-08-12 LAB — GLUCOSE, CAPILLARY
Glucose-Capillary: 169 mg/dL — ABNORMAL HIGH (ref 70–99)
Glucose-Capillary: 185 mg/dL — ABNORMAL HIGH (ref 70–99)
Glucose-Capillary: 145 mg/dL — ABNORMAL HIGH (ref 70–99)

## 2020-08-20 ENCOUNTER — Ambulatory Visit (HOSPITAL_COMMUNITY)
Admission: RE | Admit: 2020-08-20 | Discharge: 2020-08-20 | Disposition: A | Discharge status: Home / Self Care | Payer: Medicare Other | Source: Ambulatory Visit | Attending: Vascular Surgery | Admitting: Vascular Surgery

## 2020-08-20 ENCOUNTER — Other Ambulatory Visit: Payer: Self-pay

## 2020-08-20 ENCOUNTER — Ambulatory Visit (INDEPENDENT_AMBULATORY_CARE_PROVIDER_SITE_OTHER): Payer: Medicare Other | Admitting: Physician Assistant

## 2020-08-20 VITALS — BP 106/71 | HR 98 | Temp 97.6°F | Resp 14 | Ht 66.0 in | Wt 179.0 lb

## 2020-08-20 DIAGNOSIS — N186 End stage renal disease: Secondary | ICD-10-CM | POA: Diagnosis present

## 2020-08-20 DIAGNOSIS — Z992 Dependence on renal dialysis: Secondary | ICD-10-CM

## 2020-08-20 NOTE — Progress Notes (Addendum)
    Postoperative Access Visit   History of Present Illness   [name] is a 35 y.o. year old female who presents for postoperative follow-up for: left brachiocephalic arteriovenous fistula by Dr. Carlis Abbott on 05/23/2020.  Patient has had a failed kidney transplant however is [facility] transplant teams.  She has had failed bilateral upper extremity AV grafts that underwent multiple revisions.  She has also had a left basilic vein fistula in [id number] that has since thrombosed.  She is dialyzing via right femoral TDC on a Monday Wednesday Friday schedule.  She would like to avoid groin AV graft.   Physical Examination   Vitals:   08/20/20 1427  BP: 106/71  Pulse: 98  Resp: 14  Temp: 97.6 F (36.4 C)  TempSrc: Temporal  SpO2: 96%  Weight: 179 lb (81.2 kg)  Height: '5\' 6"'$  (1.676 m)   Body mass index is 28.89 kg/m.  left arm Incision is healed, palpable left radial pulse; no palpable thrill or flow through cephalic vein    Medical Decision Making   [name] is a 35 y.o. year old female who presents s/p left brachiocephalic arteriovenous fistula  Based on dialysis duplex and physical exam, left brachiocephalic fistula is occluded Continue dialyzing via right femoral TDC Previous venography demonstrated a patent central venous system on the left.  Dr. Carlis Abbott previously discussed the possibility of placing another graft in her left arm and patient would like to now pursue this option given that her recently created fistula has thrombosed.  She is again adamant about avoiding thigh AV graft I will discuss left arm AV graft with Dr. Carlis Abbott prior to scheduling.  For now patient will continue HD via right femoral TDC  Addendum 08/25/20: Case was discussed with Dr. Carlis Abbott.  Given that she has had two failed AV fistula creations since L sided venogram in 2020, we will repeat venogram prior to proceeding with potential L arm AVG placement.  Addendum 09/02/20:  I  have attempted to reach [name] by telephone several times over the past week however her voicemail box is full.  Fortunately, I was able to reach her this afternoon and discussed the indication for left arm venogram to see if she would be a candidate for further access attempts in the left arm.  She understands the indications, risk, and nature of the procedure.  All questions were answered and she is willing to proceed.  [name] PA-C [facility] [place] Office: [phone]  Clinic MD: Scot Dock

## 2020-08-25 DIAGNOSIS — Z0000 Encounter for general adult medical examination without abnormal findings: Secondary | ICD-10-CM

## 2020-09-02 ENCOUNTER — Other Ambulatory Visit: Payer: Self-pay

## 2020-09-04 ENCOUNTER — Encounter (HOSPITAL_COMMUNITY): Payer: Self-pay | Admitting: Vascular Surgery

## 2020-09-04 ENCOUNTER — Other Ambulatory Visit: Payer: Self-pay

## 2020-09-04 ENCOUNTER — Ambulatory Visit (HOSPITAL_COMMUNITY)
Admission: RE | Admit: 2020-09-04 | Discharge: 2020-09-04 | Disposition: A | Discharge status: Home / Self Care | Payer: Medicare Other | Attending: Vascular Surgery | Admitting: Vascular Surgery

## 2020-09-04 ENCOUNTER — Encounter (HOSPITAL_COMMUNITY)
Admission: RE | Disposition: A | Discharge status: Home / Self Care | Payer: Self-pay | Source: Home / Self Care | Attending: Vascular Surgery

## 2020-09-04 DIAGNOSIS — N186 End stage renal disease: Secondary | ICD-10-CM | POA: Diagnosis present

## 2020-09-04 DIAGNOSIS — Z940 Kidney transplant status: Secondary | ICD-10-CM | POA: Insufficient documentation

## 2020-09-04 HISTORY — PX: UPPER EXTREMITY VENOGRAPHY: CATH118272

## 2020-09-04 LAB — POCT I-STAT, CHEM 8
BUN: 31 mg/dL — ABNORMAL HIGH (ref 6–20)
Calcium, Ion: 1.1 mmol/L — ABNORMAL LOW (ref 1.15–1.40)
Chloride: 99 mmol/L (ref 98–111)
Creatinine, Ser: 6.8 mg/dL — ABNORMAL HIGH (ref 0.44–1.00)
Glucose, Bld: 232 mg/dL — ABNORMAL HIGH (ref 70–99)
HCT: 29 % — ABNORMAL LOW (ref 36.0–46.0)
Hemoglobin: 9.9 g/dL — ABNORMAL LOW (ref 12.0–15.0)
Potassium: 4.3 mmol/L (ref 3.5–5.1)
Sodium: 137 mmol/L (ref 135–145)
TCO2: 27 mmol/L (ref 22–32)

## 2020-09-04 LAB — HCG, SERUM, QUALITATIVE: Preg, Serum: NEGATIVE

## 2020-09-04 SURGERY — UPPER EXTREMITY VENOGRAPHY
Anesthesia: LOCAL | Laterality: Left

## 2020-09-04 MED ORDER — SODIUM CHLORIDE 0.9 % IV SOLN: 250.0000 mL | INTRAVENOUS | Status: DC | PRN

## 2020-09-04 MED ORDER — IODIXANOL 320 MG/ML IV SOLN
INTRAVENOUS | Status: DC | PRN
  Administered 2020-09-04: 30 mL via INTRAVENOUS

## 2020-09-04 MED ORDER — SODIUM CHLORIDE 0.9% FLUSH: 3.0000 mL | Freq: Two times a day (BID) | INTRAVENOUS | Status: DC

## 2020-09-04 MED ORDER — HYDROXYZINE HCL 50 MG PO TABS
50.0000 mg | ORAL_TABLET | ORAL | Status: AC
  Administered 2020-09-04: 50 mg via ORAL
  Filled 2020-09-04: qty 1

## 2020-09-04 MED ORDER — SODIUM CHLORIDE 0.9% FLUSH: 3.0000 mL | INTRAVENOUS | Status: DC | PRN

## 2020-09-04 SURGICAL SUPPLY — 1 items: TUBING CIL FLEX 10 FLL-RA (TUBING) ×2 IMPLANT

## 2020-09-04 NOTE — Op Note (Signed)
Date: September 04, 2020  Preoperative diagnosis: End-stage renal disease with need for permanent dialysis access  Postoperative diagnosis: Same  Procedure: Left upper extremity venogram including central venogram  Surgeon: Dr. [name], MD  Assistant: [facility] staff  Indications: Patient is a 35 year old female who has had multiple failed accesses in her left arm including previous upper arm graft.  Most recently we performed a left brachiobasilic and brachiocephalic fistula that have both failed.  Discussed updated venogram before further access to ensure her best chance of long-term patency of her future dialysis access.  Findings: The left brachiocephalic fistula appeared patent in the upper arm although diseased and occluded near the anastomosis with collaterals filling it near the antecubital cossa.  No evidence of central venous stenosis or occlusion.  The left axillary and subclavian veins as well as the innominate vein all appear patent.  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  Details: Patient was taken to the [facility] lab after informed consent was obtained.  She got an IV in her left forearm prior to going back for the procedure.  She was placed on the fluoroscopic table.  Timeout was performed.  At that time technician then injected contrast into her left upper extremity IV to evaluate the central and peripheral veins in the left arm.  She tolerated the procedure without any complications.  Plan: Patient will be scheduled for new left upper extremity AV graft versus revision of her left brachiocephalic fistula  [name], MD [facility] [place] Office: [place]

## 2020-09-04 NOTE — Progress Notes (Signed)
Hydroxyzine was given.  Patient states itching was much better.  Instructed her before receiving any dye medication to be premedicated.

## 2020-09-04 NOTE — H&P (Signed)
History and Physical Interval Note:  09/04/2020 9:12 AM  [name]  has presented today for surgery, with the diagnosis of instage renal.  The various methods of treatment have been discussed with the patient and family. After consideration of risks, benefits and other options for treatment, the patient has consented to  Procedure(s): UPPER EXTREMITY VENOGRAPHY - Left Upper (Left) as a surgical intervention.  The patient's history has been reviewed, patient examined, no change in status, stable for surgery.  I have reviewed the patient's chart and labs.  Questions were answered to the patient's satisfaction.    Left upper extremity venogram.  [name]  Postoperative Access Visit     History of Present Illness    [name] is a 35 y.o. year old female who presents for postoperative follow-up for: left brachiocephalic arteriovenous fistula by Dr. Carlis Abbott on 05/23/2020.  Patient has had a failed kidney transplant however is [facility] transplant teams.  She has had failed bilateral upper extremity AV grafts that underwent multiple revisions.  She has also had a left basilic vein fistula in [id number] that has since thrombosed.  She is dialyzing via right femoral TDC on a Monday Wednesday Friday schedule.  She would like to avoid groin AV graft.     Physical Examination       Vitals:    08/20/20 1427  BP: 106/71  Pulse: 98  Resp: 14  Temp: 97.6 F (36.4 C)  TempSrc: Temporal  SpO2: 96%  Weight: 179 lb (81.2 kg)  Height: '5\' 6"'$  (1.676 m)    Body mass index is 28.89 kg/m.   left arm Incision is healed, palpable left radial pulse; no palpable thrill or flow through cephalic vein      Medical Decision Making    [name] is a 35 y.o. year old female who presents s/p left brachiocephalic arteriovenous fistula   Based on dialysis duplex and physical exam, left brachiocephalic fistula is occluded Continue dialyzing via right femoral  TDC Previous venography demonstrated a patent central venous system on the left.  Dr. Carlis Abbott previously discussed the possibility of placing another graft in her left arm and patient would like to now pursue this option given that her recently created fistula has thrombosed.  She is again adamant about avoiding thigh AV graft I will discuss left arm AV graft with Dr. Carlis Abbott prior to scheduling.  For now patient will continue HD via right femoral TDC   Addendum 08/25/20: Case was discussed with Dr. Carlis Abbott.  Given that she has had two failed AV fistula creations since L sided venogram in 2020, we will repeat venogram prior to proceeding with potential L arm AVG placement.   Addendum 09/02/20:  I have attempted to reach Ms. Kesling by telephone several times over the past week however her voicemail box is full.  Fortunately, I was able to reach her this afternoon and discussed the indication for left arm venogram to see if she would be a candidate for further access attempts in the left arm.  She understands the indications, risk, and nature of the procedure.  All questions were answered and she is willing to proceed.   Valerie Ligas PA-C [facility] [place] Office: [phone]   Clinic MD: Scot Dock

## 2020-09-04 NOTE — Progress Notes (Signed)
Patient arrived in short stay, she states her eyebrows are itching a little not bad.  She said she started when they injected the dye.  Notified Dr Carlis Abbott, new orders received.

## 2020-09-08 ENCOUNTER — Telehealth: Payer: Self-pay

## 2020-09-22 ENCOUNTER — Other Ambulatory Visit: Payer: Self-pay

## 2020-09-22 ENCOUNTER — Encounter (HOSPITAL_COMMUNITY): Payer: Self-pay | Admitting: Vascular Surgery

## 2020-09-22 NOTE — Progress Notes (Signed)
PCP - Dr. Jeanie Cooks  Cardiologist - Dr. Johnsie Cancel EKG - 08/13/20 Chest x-ray - 07/31/20 ECHO - 02/14/20 Cardiac Cath -  CPAP -   Fasting Blood Sugar:  140-200 Checks Blood Sugar:  3-4x/day   COVID TEST- n/a  Anesthesia review: yes medical hx   -------------  SDW INSTRUCTIONS:  Your procedure is scheduled on 7/13 Wednesday. Please report to [facility] Main Entrance "A" at 0830 A.M., and check in at the Admitting office. Call this number if you have problems the morning of surgery: [phone]   Remember: Do not eat or drink after midnight the night before your surgery   Medications to take morning of surgery with a sip of water include: acetaminophen (TYLENOL)  cetirizine (ZYRTEC)  fluticasone (FLONASE)  gabapentin (NEURONTIN)  pantoprazole (PROTONIX)  metoprolol tartrate (LOPRESSOR)  ( per pt BP low so d/c by MD - do not take per MD order)  ** PLEASE check your blood sugar the morning of your surgery when you wake up and every 2 hours until you get to the Short Stay unit.  If your blood sugar is less than 70 mg/dL, you will need to treat for low blood sugar: Do not take insulin. Treat a low blood sugar (less than 70 mg/dL) with  cup of clear juice (cranberry or apple), 4 glucose tablets, OR glucose gel. Recheck blood sugar in 15 minutes after treatment (to make sure it is greater than 70 mg/dL). If your blood sugar is not greater than 70 mg/dL on recheck, call [phone] for further instructions.  insulin aspart (NOVOLOG)  7/12: AM dose, no bedtime dose 7/13: none - if CBG >220, take half usual dose  insulin degludec (TRESIBA)  7/12: take usual 7/13: take 10 units  As of today, STOP taking any Aspirin (unless otherwise instructed by your surgeon), Aleve, Naproxen, Ibuprofen, Motrin, Advil, Goody's, BC's, all herbal medications, fish oil, and all vitamins.    The Morning of Surgery Do not wear jewelry, make-up or nail polish. Do not wear lotions, powders, or perfumes,  or deodorant Do not shave 48 hours prior to surgery.   Do not bring valuables to the hospital. [facility] is not responsible for any belongings or valuables.  If you are a smoker, DO NOT Smoke 24 hours prior to surgery If you wear a CPAP at night please bring your mask the morning of surgery  Remember that you must have someone to transport you home after your surgery, and remain with you for 24 hours if you are discharged the same day.  Please bring cases for contacts, glasses, hearing aids, dentures or bridgework because it cannot be worn into surgery.   Patients discharged the day of surgery will not be allowed to drive home.   Please shower the NIGHT BEFORE/MORNING OF SURGERY (use antibacterial soap like DIAL soap if possible). Wear comfortable clothes the morning of surgery. Oral Hygiene is also important to reduce your risk of infection.  Remember - BRUSH YOUR TEETH THE MORNING OF SURGERY WITH YOUR REGULAR TOOTHPASTE  Patient denies shortness of breath, fever, cough and chest pain.

## 2020-09-23 NOTE — Anesthesia Preprocedure Evaluation (Addendum)
Anesthesia Evaluation  Patient identified by MRN, date of birth, ID band Patient awake    Reviewed: Allergy & Precautions, NPO status , Patient's Chart, lab work & pertinent test results  Airway Mallampati: II  TM Distance: >3 FB Neck ROM: Full    Dental  (+) Teeth Intact, Chipped,    Pulmonary neg pulmonary ROS,    Pulmonary exam normal        Cardiovascular hypertension, Pt. on medications and Pt. on home beta blockers  Rhythm:Regular Rate:Normal     Neuro/Psych Seizures -, Well Controlled,  negative psych ROS   GI/Hepatic Neg liver ROS, GERD  Medicated,  Endo/Other  diabetes, Type 2, Insulin Dependent  Renal/GU ESRF and DialysisRenal disease     Musculoskeletal negative musculoskeletal ROS (+)   Abdominal (+)  Abdomen: soft. Bowel sounds: normal.  Peds  Hematology  (+) anemia ,   Anesthesia Other Findings   Reproductive/Obstetrics                          Anesthesia Physical Anesthesia Plan  ASA: 3  Anesthesia Plan: General   Post-op Pain Management:    Induction: Intravenous  PONV Risk Score and Plan: 3 and Ondansetron, Dexamethasone, Midazolam and Treatment may vary due to age or medical condition  Airway Management Planned: Mask and LMA  Additional Equipment: None  Intra-op Plan:   Post-operative Plan: Extubation in OR  Informed Consent: I have reviewed the patients History and Physical, chart, labs and discussed the procedure including the risks, benefits and alternatives for the proposed anesthesia with the patient or authorized representative who has indicated his/her understanding and acceptance.     Dental advisory given  Plan Discussed with: CRNA  Anesthesia Plan Comments: (PAT note written 09/23/2020 by Myra Gianotti, PA-C. Lab Results      Component                Value               Date                      WBC                      7.7                  08/10/2020                HGB                      10.5 (L)            09/24/2020                HCT                      31.0 (L)            09/24/2020                MCV                      98.7                08/10/2020                PLT  222                 08/10/2020           Lab Results      Component                Value               Date                      NA                       137                 09/24/2020                K                        4.2                 09/24/2020                CO2                      29                  08/10/2020                GLUCOSE                  235 (H)             09/24/2020                BUN                      25 (H)              09/24/2020                CREATININE               5.40 (H)            09/24/2020                CALCIUM                  6.8 (L)             08/10/2020                GFRNONAA                 7 (L)               08/10/2020                GFRAA                    7 (L)               04/17/2020          )      Anesthesia Quick Evaluation

## 2020-09-23 NOTE — Progress Notes (Signed)
Anesthesia Chart Review:  Case: [id number] Date/Time: 09/24/20 1055   Procedure: LEFT ARM ARTERIOVENOUS FISTULA REVISION VERSUS LEFT ARM ARTERIOVENOUS GRAFT CREATION (Left)   Anesthesia type: Choice   Pre-op diagnosis: ESRD   Location: [facility] OR ROOM 11 / [place] [facility]   Surgeons: [name], MD       DISCUSSION: Patient is a 35 year old female scheduled for the above procedure. S/p multiple failed HD accesses in LUE, last left brachiocephalic AVF 0000000 which was occluded near the anastomosis with no evidence of central venous stenosis by recent venogram. 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 Currently dialyzing via right femoral TDC on MWF ([facility]).   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   - [place] admission 08/09/20-08/10/20 for hematemesis/UGI bleed. HGB 9.6-->7.2, s/p 1 unit PRBC. CT showed lower esophageal thickening. GI consulted. Known gastroparesis. S/p EGD 08/10/20 and found to have severe esophagitis with no active bleeding. PPI BID, improve glucose control, and regular use of metoclopramide recommended. Nephrology managed ESRD.   - [place] admission 07/31/20-08/02/20 and 04/20/20-04/23/20 for gastroparesis, DKA. A1c 11.0% on 08/01/20. Says home CBGs now ~ 140-200.   Anesthesia team to evaluate on the day of surgery.    VS: Ht '5\' 6"'$  (1.676 m)   Wt 81.6 kg   BMI 29.05 kg/m  BP Readings from Last 3 Encounters:  09/04/20 119/84  08/20/20 106/71  08/10/20 (!) 143/85   Pulse Readings from Last 3 Encounters:  09/04/20 83  08/20/20 98  08/10/20 100     PROVIDERS: [name], MD is PCP  - Jenkins Rouge, MD is  cardiologist. Geanie Cooley 04/11/20 for preoperative evaluation since she is being considered for 2nd renal transplant. Previous evaluation by Cherlynn Kaiser, MD 02/12/20 for tachycardia post-seizure/pseudoseizure. Had SVT at 220 bpm not responsive to adenosine but was cardioverted to SR at 150 jules, managed on b-blocker. No structural heart disease on echo. 05/2020 Lexiscan was non-ischemic, so as needed cardiology follow-up recommended.  Marcial Pacas, MD is neurologist. Last visit 04/29/20. Previously evaluated followin g11/30/21 seizure. Per 03/04/20 notation by Amie Portland, MD, she had "generalized tonic-clonic seizure activity given that she was tachycardic and had severe hypertension in the setting of DKA." EEG showed mild slowing, no seizures. Provoked seizure in setting of metabolic derangement suspected. No anti-epileptic drugs recommended at that time. Dr. Krista Blue repeated EEG which was normal in 06/11/20, so continued observation noted. Previous seziure in 2014 and 02/12/20 thought likely related to hyperglycemia/metabolic derangements.   Madelon Lips, MD is nephrologist. She is also undergoing renal transplant evaluation through [facility].  - DM managed at [facility] by Melton Alar, PA-C   LABS: For ISTAT day of surgery. Currently, most recent results include: Lab Results  Component Value Date   WBC 7.7 08/10/2020   HGB 9.9 (L) 09/04/2020   HCT 29.0 (L) 09/04/2020   PLT 222 08/10/2020   GLUCOSE 232 (H) 09/04/2020   ALT 10 08/10/2020   AST 11 (L) 08/10/2020   NA 137 09/04/2020   K 4.3 09/04/2020   CL 99 09/04/2020   CREATININE 6.80 (H) 09/04/2020   BUN 31 (H) 09/04/2020   CO2 29 08/10/2020   TSH 1.53 04/17/2020   HGBA1C 11.0 (H) 08/01/2020    OTHER:  EEG 06/11/20: CONCLUSION: This is a  normal awake EEG.  There is no electrodiagnostic evidence of epileptiform discharge.   EEG 02/13/20: IMPRESSION: This study is suggestive of moderate diffuse encephalopathy, nonspecific  etiology.  No seizures or epileptiform discharges were seen throughout the recording.   IMAGES: CXR 07/31/20: FINDINGS: - Mildly larger lung volumes. Stable cardiac size and mediastinal contours. Heart size chronically at the upper limits of normal. Inferior approach dialysis type catheter tip projects at the inferior cavoatrial junction as before. - Tracheal air column is within normal limits. No pneumothorax, pulmonary edema, pleural effusion or confluent pulmonary opacity. - No osseous abnormality identified. Stable cholecystectomy clips. Negative visible bowel gas pattern. IMPRESSION: No acute cardiopulmonary abnormality.   EKG: 08/10/20:  Normal sinus rhythm Prolonged QT (QT 440 ms, QTc 523 ms) Abnormal ECG No significant change since last tracing Confirmed by Carlyle Dolly [id number]) on 08/10/2020 7:37:30 PM   CV: Nuclear stress test 05/22/20: There was no ST segment deviation noted during stress. The left ventricular ejection fraction is normal (55-65%). Nuclear stress EF: 61%. The study is normal. This is a low risk study.   Cardiac event monitor 02/25/20-03/25/20: Study Highlights NSR Her symptoms of palpitations, tachycardia and dyspnea all showed only NSR with no arrhythmia One asymptomatic run of NSVT 13 beats   Echo 02/14/20: IMPRESSIONS   1. Limited study due to pt vomiting; only parasternal and one apical  image obtained.   2. Left ventricular ejection fraction, by estimation, is 55 to 60%. The  left ventricle has normal function. The left ventricle has no regional  wall motion abnormalities. Left ventricular diastolic function could not  be evaluated.   3. Right ventricular systolic function is normal. The right ventricular  size is normal. Tricuspid regurgitation signal is inadequate for assessing  PA pressure.   4. The mitral valve is normal in structure. Trivial mitral valve  regurgitation. No evidence of mitral stenosis.   5. The aortic valve has  an indeterminant number of cusps. Aortic valve  regurgitation Not assessed. Not assessed.   Echo 02/07/19: IMPRESSIONS   1. Left ventricular ejection fraction, by visual estimation, is 60 to  65%. The left ventricle has normal function. There is no left ventricular  hypertrophy.   2. Global right ventricle has normal systolic function.The right  ventricular size is normal. No increase in right ventricular wall  thickness.   3. Left atrial size was normal.   4. Right atrial size was normal.   5. The mitral valve is normal in structure. Trace mitral valve  regurgitation. No evidence of mitral stenosis.   6. The tricuspid valve is normal in structure. Tricuspid valve  regurgitation is mild.   7. The aortic valve is grossly normal. Aortic valve regurgitation is  trivial. No evidence of aortic valve sclerosis or stenosis.   8. There is Mild focal calcification of the aortic valve adjacent to the  noncoronary-left coronary commissure.   9. The pulmonic valve was normal in structure. Pulmonic valve  regurgitation is trivial.  10. Normal pulmonary artery systolic pressure.  11. The inferior vena cava is normal in size with greater than 50%  respiratory variability, suggesting right atrial pressure of 3 mmHg.    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   LEARNING DISABILITY 09/25/2007   Qualifier: Diagnosis of  By: Deborra Medina MD, Talia     Pseudoseizures ([place]) 12/22/2012   Pyelonephritis 06/23/2014   Seasonal allergies    UTI (urinary tract infection) 01/09/2015   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   Qualifier: Diagnosis of  By: Carlena Sax  MD, Colletta [place]      Past Surgical History:  Procedure Laterality Date   ARTERIOVENOUS GRAFT PLACEMENT Bilateral     "neither work" (10/24/2017)   AV FISTULA PLACEMENT Left 10/26/2018   Procedure: CREATION OF ARTERIOVENOUS FISTULA  LEFT ARM;  Surgeon: [name], MD;  Location: [place];  Service: Vascular;  Laterality: Left;   AV FISTULA PLACEMENT Left 05/23/2020   Procedure: LEFT ARM ARTERIOVENOUS (AV) FISTULA CREATION;  Surgeon: [name], MD;  Location: [place];  Service: Vascular;  Laterality: Left;   [place] Left 12/21/2018   Procedure: Left arm BASILIC VEIN TRANSPOSITION SECOND STAGE;  Surgeon: [name], MD;  Location: [place];  Service: Vascular;  Laterality: Left;   CHOLECYSTECTOMY N/A 06/30/2017   Procedure: LAPAROSCOPIC CHOLECYSTECTOMY WITH INTRAOPERATIVE CHOLANGIOGRAM;  Surgeon: Excell Seltzer, MD;  Location: [facility];  Service: General;  Laterality: N/A;   ESOPHAGOGASTRODUODENOSCOPY (EGD) WITH PROPOFOL N/A 07/04/2017   Procedure: ESOPHAGOGASTRODUODENOSCOPY (EGD) WITH PROPOFOL;  Surgeon: Clarene Essex, MD;  Location: [facility];  Service: Endoscopy;  Laterality: N/A;   ESOPHAGOGASTRODUODENOSCOPY (EGD) WITH PROPOFOL N/A 08/10/2020   Procedure: ESOPHAGOGASTRODUODENOSCOPY (EGD) WITH PROPOFOL;  Surgeon: Doran Stabler, MD;  Location: [place];  Service: Gastroenterology;  Laterality: N/A;   INSERTION OF DIALYSIS CATHETER N/A 03/20/2018   Procedure: INSERTION OF TUNNELED DIALYSIS CATHETER - RIGHT INTERANL JUGULAR PLACEMENT;  Surgeon: Angelia Mould, MD;  Location: [place];  Service: Vascular;  Laterality: N/A;   IR FLUORO GUIDE CV LINE RIGHT  04/18/2020   IR GUIDED DRAIN W CATHETER PLACEMENT  10/28/2018   KIDNEY TRANSPLANT  2007   PARATHYROIDECTOMY  ?2012   "3/4 removed" (10/24/2017)   RENAL BIOPSY Bilateral 2003   UPPER EXTREMITY VENOGRAPHY Bilateral 10/19/2018   Procedure: UPPER EXTREMITY VENOGRAPHY;  Surgeon: [name], MD;  Location: [place] [facility];  Service: Cardiovascular;  Laterality: Bilateral;  Bilateral    UPPER EXTREMITY  VENOGRAPHY Left 09/04/2020   Procedure: UPPER EXTREMITY VENOGRAPHY - Left Upper;  Surgeon: [name], MD;  Location: [employer] [facility];  Service: Cardiovascular;  Laterality: Left;    MEDICATIONS: No current facility-administered medications for this encounter.    ACCU-CHEK SOFTCLIX LANCETS lancets   acetaminophen (TYLENOL) 500 MG tablet   calcium acetate (PHOSLO) 667 MG capsule   Calcium Carbonate Antacid (CALCIUM CARBONATE, DOSED IN MG ELEMENTAL CALCIUM,) 1250 MG/5ML SUSP   cetirizine (ZYRTEC) 10 MG tablet   famotidine (PEPCID) 40 MG tablet   fluticasone (FLONASE) 50 MCG/ACT nasal spray   gabapentin (NEURONTIN) 400 MG capsule   hydrOXYzine (ATARAX/VISTARIL) 50 MG tablet   insulin aspart (NOVOLOG) 100 UNIT/ML FlexPen   insulin degludec (TRESIBA) 100 UNIT/ML FlexTouch Pen   Insulin Pen Needle (BD PEN NEEDLE NANO 2ND GEN) 32G X 4 MM MISC   loperamide (IMODIUM) 1 MG/5ML solution   metoprolol tartrate (LOPRESSOR) 25 MG tablet   Multiple Vitamins-Minerals (EQ MULTIVITAMINS ADULT GUMMY PO)   pantoprazole (PROTONIX) 40 MG tablet   promethazine (PHENERGAN) 25 MG tablet    Myra Gianotti, PA-C Surgical Short Stay/Anesthesiology [facility] Phone [phone] [facility] Phone [phone] 09/23/2020  1:18 AM

## 2020-09-24 ENCOUNTER — Encounter (HOSPITAL_COMMUNITY): Payer: Self-pay | Admitting: Vascular Surgery

## 2020-09-24 ENCOUNTER — Other Ambulatory Visit: Payer: Self-pay

## 2020-09-24 ENCOUNTER — Ambulatory Visit (HOSPITAL_COMMUNITY)
Admission: RE | Admit: 2020-09-24 | Discharge: 2020-09-24 | Disposition: A | Discharge status: Home / Self Care | Payer: Medicare Other | Attending: Vascular Surgery | Admitting: Vascular Surgery

## 2020-09-24 ENCOUNTER — Ambulatory Visit (HOSPITAL_COMMUNITY): Payer: Medicare Other | Admitting: Vascular Surgery

## 2020-09-24 ENCOUNTER — Encounter (HOSPITAL_COMMUNITY)
Admission: RE | Disposition: A | Discharge status: Home / Self Care | Payer: Self-pay | Source: Home / Self Care | Attending: Vascular Surgery

## 2020-09-24 DIAGNOSIS — I120 Hypertensive chronic kidney disease with stage 5 chronic kidney disease or end stage renal disease: Secondary | ICD-10-CM | POA: Diagnosis not present

## 2020-09-24 DIAGNOSIS — N186 End stage renal disease: Secondary | ICD-10-CM | POA: Insufficient documentation

## 2020-09-24 DIAGNOSIS — Z79899 Other long term (current) drug therapy: Secondary | ICD-10-CM | POA: Diagnosis not present

## 2020-09-24 DIAGNOSIS — Z940 Kidney transplant status: Secondary | ICD-10-CM | POA: Insufficient documentation

## 2020-09-24 DIAGNOSIS — E1022 Type 1 diabetes mellitus with diabetic chronic kidney disease: Secondary | ICD-10-CM | POA: Insufficient documentation

## 2020-09-24 DIAGNOSIS — Z794 Long term (current) use of insulin: Secondary | ICD-10-CM | POA: Diagnosis not present

## 2020-09-24 HISTORY — PX: REVISON OF ARTERIOVENOUS FISTULA: SHX6074

## 2020-09-24 LAB — POCT I-STAT, CHEM 8
BUN: 25 mg/dL — ABNORMAL HIGH (ref 6–20)
Calcium, Ion: 1.13 mmol/L — ABNORMAL LOW (ref 1.15–1.40)
Chloride: 102 mmol/L (ref 98–111)
Creatinine, Ser: 5.4 mg/dL — ABNORMAL HIGH (ref 0.44–1.00)
Glucose, Bld: 235 mg/dL — ABNORMAL HIGH (ref 70–99)
HCT: 31 % — ABNORMAL LOW (ref 36.0–46.0)
Hemoglobin: 10.5 g/dL — ABNORMAL LOW (ref 12.0–15.0)
Potassium: 4.2 mmol/L (ref 3.5–5.1)
Sodium: 137 mmol/L (ref 135–145)
TCO2: 26 mmol/L (ref 22–32)

## 2020-09-24 LAB — GLUCOSE, CAPILLARY
Glucose-Capillary: 234 mg/dL — ABNORMAL HIGH (ref 70–99)
Glucose-Capillary: 195 mg/dL — ABNORMAL HIGH (ref 70–99)
Glucose-Capillary: 100 mg/dL — ABNORMAL HIGH (ref 70–99)

## 2020-09-24 LAB — HCG, SERUM, QUALITATIVE: Preg, Serum: NEGATIVE

## 2020-09-24 SURGERY — REVISON OF ARTERIOVENOUS FISTULA
Anesthesia: General | Site: Arm Upper | Laterality: Left

## 2020-09-24 MED ORDER — CHLORHEXIDINE GLUCONATE 0.12 % MT SOLN
OROMUCOSAL | Status: AC
  Administered 2020-09-24: 15 mL via OROMUCOSAL
  Filled 2020-09-24: qty 15

## 2020-09-24 MED ORDER — AMISULPRIDE (ANTIEMETIC) 5 MG/2ML IV SOLN
10.0000 mg | Freq: Once | INTRAVENOUS | Status: AC
  Administered 2020-09-24: 10 mg via INTRAVENOUS

## 2020-09-24 MED ORDER — PROPOFOL 10 MG/ML IV BOLUS
INTRAVENOUS | Status: DC | PRN
  Administered 2020-09-24: 130 mg via INTRAVENOUS

## 2020-09-24 MED ORDER — DEXAMETHASONE SODIUM PHOSPHATE 10 MG/ML IJ SOLN
INTRAMUSCULAR | Status: DC | PRN
  Administered 2020-09-24: 10 mg via INTRAVENOUS

## 2020-09-24 MED ORDER — FENTANYL CITRATE (PF) 100 MCG/2ML IJ SOLN
INTRAMUSCULAR | Status: AC
  Filled 2020-09-24: qty 2

## 2020-09-24 MED ORDER — LACTATED RINGERS IV SOLN: INTRAVENOUS | Status: DC

## 2020-09-24 MED ORDER — INSULIN ASPART 100 UNIT/ML IJ SOLN
8.0000 [IU] | Freq: Once | INTRAMUSCULAR | Status: AC
  Administered 2020-09-24: 8 [IU] via SUBCUTANEOUS
  Filled 2020-09-24: qty 1

## 2020-09-24 MED ORDER — HEPARIN SODIUM (PORCINE) 1000 UNIT/ML IJ SOLN: 3000.0000 [IU] | Freq: Once | INTRAMUSCULAR | Status: DC

## 2020-09-24 MED ORDER — PHENYLEPHRINE HCL-NACL 10-0.9 MG/250ML-% IV SOLN
INTRAVENOUS | Status: DC | PRN
  Administered 2020-09-24: 50 ug/min via INTRAVENOUS

## 2020-09-24 MED ORDER — PROTAMINE SULFATE 10 MG/ML IV SOLN
INTRAVENOUS | Status: DC | PRN
  Administered 2020-09-24: 20 mg via INTRAVENOUS

## 2020-09-24 MED ORDER — LIDOCAINE 2% (20 MG/ML) 5 ML SYRINGE
INTRAMUSCULAR | Status: DC | PRN
  Administered 2020-09-24: 100 mg via INTRAVENOUS

## 2020-09-24 MED ORDER — FENTANYL CITRATE (PF) 250 MCG/5ML IJ SOLN
INTRAMUSCULAR | Status: DC | PRN
  Administered 2020-09-24 (×2): 25 ug via INTRAVENOUS

## 2020-09-24 MED ORDER — METOPROLOL TARTRATE 12.5 MG HALF TABLET
25.0000 mg | ORAL_TABLET | Freq: Once | ORAL | Status: AC
  Administered 2020-09-24: 25 mg via ORAL
  Filled 2020-09-24: qty 2

## 2020-09-24 MED ORDER — CEFAZOLIN SODIUM-DEXTROSE 2-4 GM/100ML-% IV SOLN
2.0000 g | INTRAVENOUS | Status: AC
  Administered 2020-09-24: 2 g via INTRAVENOUS
  Filled 2020-09-24: qty 100

## 2020-09-24 MED ORDER — DEXMEDETOMIDINE (PRECEDEX) IN NS 20 MCG/5ML (4 MCG/ML) IV SYRINGE
PREFILLED_SYRINGE | INTRAVENOUS | Status: AC
  Filled 2020-09-24: qty 5

## 2020-09-24 MED ORDER — HEPARIN SODIUM (PORCINE) 1000 UNIT/ML IJ SOLN
2600.0000 [IU] | Freq: Once | INTRAMUSCULAR | Status: AC
  Administered 2020-09-24: 2600 [IU] via INTRAVENOUS

## 2020-09-24 MED ORDER — ONDANSETRON HCL 4 MG/2ML IJ SOLN
INTRAMUSCULAR | Status: DC | PRN
  Administered 2020-09-24: 4 mg via INTRAVENOUS

## 2020-09-24 MED ORDER — MICROFIBRILLAR COLL HEMOSTAT EX PADS
MEDICATED_PAD | CUTANEOUS | Status: DC | PRN
  Administered 2020-09-24: 1 via TOPICAL

## 2020-09-24 MED ORDER — HEPARIN 6000 UNIT IRRIGATION SOLUTION
Status: DC | PRN
  Administered 2020-09-24: 1

## 2020-09-24 MED ORDER — ORAL CARE MOUTH RINSE: 15.0000 mL | Freq: Once | OROMUCOSAL | Status: AC

## 2020-09-24 MED ORDER — FENTANYL CITRATE (PF) 100 MCG/2ML IJ SOLN
25.0000 ug | INTRAMUSCULAR | Status: DC | PRN
  Administered 2020-09-24 (×2): 50 ug via INTRAVENOUS

## 2020-09-24 MED ORDER — SODIUM CHLORIDE 0.9 % IV SOLN: INTRAVENOUS | Status: DC

## 2020-09-24 MED ORDER — LIDOCAINE HCL 1 % IJ SOLN: INTRAMUSCULAR | Status: DC | PRN

## 2020-09-24 MED ORDER — 0.9 % SODIUM CHLORIDE (POUR BTL) OPTIME
TOPICAL | Status: DC | PRN
  Administered 2020-09-24: 1000 mL

## 2020-09-24 MED ORDER — HEPARIN SODIUM (PORCINE) 1000 UNIT/ML IJ SOLN: 2600.0000 [IU] | Freq: Once | INTRAMUSCULAR | Status: DC

## 2020-09-24 MED ORDER — OXYCODONE-ACETAMINOPHEN 5-325 MG PO TABS: 1.0000 | ORAL_TABLET | Freq: Four times a day (QID) | ORAL | 0 refills | Status: DC | PRN

## 2020-09-24 MED ORDER — ACETAMINOPHEN 10 MG/ML IV SOLN: 1000.0000 mg | Freq: Once | INTRAVENOUS | Status: DC | PRN

## 2020-09-24 MED ORDER — HEPARIN SODIUM (PORCINE) 1000 UNIT/ML IJ SOLN
INTRAMUSCULAR | Status: DC | PRN
  Administered 2020-09-24: 5000 [IU] via INTRAVENOUS

## 2020-09-24 MED ORDER — FENTANYL CITRATE (PF) 250 MCG/5ML IJ SOLN
INTRAMUSCULAR | Status: AC
  Filled 2020-09-24: qty 5

## 2020-09-24 MED ORDER — CHLORHEXIDINE GLUCONATE 0.12 % MT SOLN: 15.0000 mL | Freq: Once | OROMUCOSAL | Status: AC

## 2020-09-24 MED ORDER — AMISULPRIDE (ANTIEMETIC) 5 MG/2ML IV SOLN
INTRAVENOUS | Status: AC
  Filled 2020-09-24: qty 4

## 2020-09-24 MED ORDER — HEPARIN 6000 UNIT IRRIGATION SOLUTION
Status: AC
  Filled 2020-09-24: qty 500

## 2020-09-24 MED ORDER — LIDOCAINE HCL (PF) 1 % IJ SOLN
INTRAMUSCULAR | Status: AC
  Filled 2020-09-24: qty 30

## 2020-09-24 SURGICAL SUPPLY — 42 items
ARMBAND PINK RESTRICT EXTREMIT (MISCELLANEOUS) ×2 IMPLANT
BAG COUNTER SPONGE SURGICOUNT (BAG) ×2 IMPLANT
CANISTER SUCT 3000ML PPV (MISCELLANEOUS) ×2 IMPLANT
CLIP VESOCCLUDE MED 6/CT (CLIP) ×2 IMPLANT
CLIP VESOCCLUDE SM WIDE 6/CT (CLIP) ×2 IMPLANT
COVER PROBE W GEL 5X96 (DRAPES) IMPLANT
DECANTER SPIKE VIAL GLASS SM (MISCELLANEOUS) ×2 IMPLANT
DERMABOND ADVANCED (GAUZE/BANDAGES/DRESSINGS) ×1
DERMABOND ADVANCED .7 DNX12 (GAUZE/BANDAGES/DRESSINGS) ×1 IMPLANT
ELECT REM PT RETURN 9FT ADLT (ELECTROSURGICAL) ×2
ELECTRODE REM PT RTRN 9FT ADLT (ELECTROSURGICAL) ×1 IMPLANT
GAUZE 4X4 16PLY ~~LOC~~+RFID DBL (SPONGE) ×2 IMPLANT
GLOVE SRG 8 PF TXTR STRL LF DI (GLOVE) ×1 IMPLANT
GLOVE SURG ENC MOIS LTX SZ7.5 (GLOVE) ×2 IMPLANT
GLOVE SURG UNDER POLY LF SZ6.5 (GLOVE) ×6 IMPLANT
GLOVE SURG UNDER POLY LF SZ7 (GLOVE) ×2 IMPLANT
GLOVE SURG UNDER POLY LF SZ8 (GLOVE) ×2
GOWN STRL REUS W/ TWL LRG LVL3 (GOWN DISPOSABLE) ×2 IMPLANT
GOWN STRL REUS W/ TWL XL LVL3 (GOWN DISPOSABLE) ×2 IMPLANT
GOWN STRL REUS W/TWL LRG LVL3 (GOWN DISPOSABLE) ×4
GOWN STRL REUS W/TWL XL LVL3 (GOWN DISPOSABLE) ×4
GRAFT GORETEX STRT 4-7X45 (Vascular Products) ×2 IMPLANT
HEMOSTAT SPONGE AVITENE ULTRA (HEMOSTASIS) ×2 IMPLANT
KIT BASIN OR (CUSTOM PROCEDURE TRAY) ×2 IMPLANT
KIT TURNOVER KIT B (KITS) ×2 IMPLANT
LOOP VESSEL MINI RED (MISCELLANEOUS) ×4 IMPLANT
NS IRRIG 1000ML POUR BTL (IV SOLUTION) ×2 IMPLANT
PACK CV ACCESS (CUSTOM PROCEDURE TRAY) ×2 IMPLANT
PAD ARMBOARD 7.5X6 YLW CONV (MISCELLANEOUS) ×4 IMPLANT
SCRUB TECHNI CARE SURGICAL (MISCELLANEOUS) ×2 IMPLANT
SPONGE T-LAP 18X18 ~~LOC~~+RFID (SPONGE) ×4 IMPLANT
STAPLER VISISTAT 35W (STAPLE) IMPLANT
SUT MNCRL AB 4-0 PS2 18 (SUTURE) ×4 IMPLANT
SUT PROLENE 5 0 C 1 24 (SUTURE) IMPLANT
SUT PROLENE 6 0 BV (SUTURE) ×2 IMPLANT
SUT PROLENE 7 0 BV 1 (SUTURE) IMPLANT
SUT SILK 2 0 SH (SUTURE) ×2 IMPLANT
SUT VIC AB 3-0 SH 27 (SUTURE) ×6
SUT VIC AB 3-0 SH 27X BRD (SUTURE) ×3 IMPLANT
TOWEL GREEN STERILE (TOWEL DISPOSABLE) ×2 IMPLANT
UNDERPAD 30X36 HEAVY ABSORB (UNDERPADS AND DIAPERS) ×2 IMPLANT
WATER STERILE IRR 1000ML POUR (IV SOLUTION) ×2 IMPLANT

## 2020-09-24 NOTE — H&P (Signed)
History and Physical Interval Note:  09/24/2020 10:18 AM  [name]  has presented today for surgery, with the diagnosis of ESRD.  The various methods of treatment have been discussed with the patient and family. After consideration of risks, benefits and other options for treatment, the patient has consented to  Procedure(s): LEFT ARM ARTERIOVENOUS FISTULA REVISION VERSUS LEFT ARM ARTERIOVENOUS GRAFT CREATION (Left) as a surgical intervention.  The patient's history has been reviewed, patient examined, no change in status, stable for surgery.  I have reviewed the patient's chart and labs.  Questions were answered to the patient's satisfaction.    Recent venogram of left upper extremity.  Discussed attempting to revise the left brachiocephalic AV fistula versus new upper arm graft.  No option in the right arm given central high-grade stenosis.  Realizes she has many failed access attempts in the past with limited options.  [name]  Postoperative Access Visit     History of Present Illness    [name] is a 35 y.o. year old female who presents for postoperative follow-up for: left brachiocephalic arteriovenous fistula by Dr. Carlis Abbott on 05/23/2020.  Patient has had a failed kidney transplant however is in [facility] with [facility] transplant teams.  She has had failed bilateral upper extremity AV grafts that underwent multiple revisions.  She has also had a left basilic vein fistula in [id number] that has since thrombosed.  She is dialyzing via right femoral TDC on a Monday Wednesday Friday schedule.  She would like to avoid groin AV graft.     Physical Examination         Vitals:    08/20/20 1427  BP: 106/71  Pulse: 98  Resp: 14  Temp: 97.6 F (36.4 C)  TempSrc: Temporal  SpO2: 96%  Weight: 179 lb (81.2 kg)  Height: '5\' 6"'$  (1.676 m)    Body mass index is 28.89 kg/m.   left arm Incision is healed, palpable left radial pulse; no palpable thrill or flow  through cephalic vein      Medical Decision Making    [name] is a 35 y.o. year old female who presents s/p left brachiocephalic arteriovenous fistula   Based on dialysis duplex and physical exam, left brachiocephalic fistula is occluded Continue dialyzing via right femoral TDC Previous venography demonstrated a patent central venous system on the left.  Dr. Carlis Abbott previously discussed the possibility of placing another graft in her left arm and patient would like to now pursue this option given that her recently created fistula has thrombosed.  She is again adamant about avoiding thigh AV graft I will discuss left arm AV graft with Dr. Carlis Abbott prior to scheduling.  For now patient will continue HD via right femoral TDC   Addendum 08/25/20: Case was discussed with Dr. Carlis Abbott.  Given that she has had two failed AV fistula creations since L sided venogram in 2020, we will repeat venogram prior to proceeding with potential L arm AVG placement.   Addendum 09/02/20:  I have attempted to reach Ms. Kaatz by telephone several times over the past week however her voicemail box is full.  Fortunately, I was able to reach her this afternoon and discussed the indication for left arm venogram to see if she would be a candidate for further access attempts in the left arm.  She understands the indications, risk, and nature of the procedure.  All questions were answered and she is willing to proceed.  Valerie Ligas PA-C [facility] [place] Office: [phone]   Clinic MD: Scot Dock

## 2020-09-24 NOTE — Anesthesia Postprocedure Evaluation (Signed)
Anesthesia Post Note  Patient: [name]  Procedure(s) Performed: LEFT UPPER ARM ARTERIOVENOUS GRAFT CREATION (Left: Arm Upper)     Patient location during evaluation: PACU Anesthesia Type: General Level of consciousness: awake and alert Pain management: pain level controlled Vital Signs Assessment: post-procedure vital signs reviewed and stable Respiratory status: spontaneous breathing, nonlabored ventilation, respiratory function stable and patient connected to nasal cannula oxygen Cardiovascular status: blood pressure returned to baseline and stable Postop Assessment: no apparent nausea or vomiting Anesthetic complications: no   No notable events documented.  Last Vitals:  Vitals:   09/24/20 1439 09/24/20 1450  BP: 115/88 (!) 119/94  Pulse: 69 74  Resp: 13 14  Temp:  (!) 36.1 C  SpO2: 100% 100%    Last Pain:  Vitals:   09/24/20 1330  TempSrc:   PainSc: 10-Worst pain ever                 Belenda Cruise P Shaynna Husby

## 2020-09-24 NOTE — Discharge Instructions (Signed)
   [facility]  Discharge Instructions  AV Fistula or Graft Surgery for Dialysis Access  Please refer to the following instructions for your post-procedure care. Your surgeon or physician assistant will discuss any changes with you.  Activity  You may drive the day following your surgery, if you are comfortable and no longer taking prescription pain medication. Resume full activity as the soreness in your incision resolves.  Bathing/Showering  You may shower after you go home. Keep your incision dry for 48 hours. Do not soak in a bathtub, hot tub, or swim until the incision heals completely. You may not shower if you have a hemodialysis catheter.  Incision Care  Clean your incision with mild soap and water after 48 hours. Pat the area dry with a clean towel. You do not need a bandage unless otherwise instructed. Do not apply any ointments or creams to your incision. You may have skin glue on your incision. Do not peel it off. It will come off on its own in about one week. Your arm may swell a bit after surgery. To reduce swelling use pillows to elevate your arm so it is above your heart. Your doctor will tell you if you need to lightly wrap your arm with an ACE bandage.  Diet  Resume your normal diet. There are not special food restrictions following this procedure. In order to heal from your surgery, it is CRITICAL to get adequate nutrition. Your body requires vitamins, minerals, and protein. Vegetables are the best source of vitamins and minerals. Vegetables also provide the perfect balance of protein. Processed food has little nutritional value, so try to avoid this.  Medications  Resume taking all of your medications. If your incision is causing pain, you may take over-the counter pain relievers such as acetaminophen (Tylenol). If you were prescribed a stronger pain medication, please be aware these medications can cause nausea and constipation. Prevent  nausea by taking the medication with a snack or meal. Avoid constipation by drinking plenty of fluids and eating foods with high amount of fiber, such as fruits, vegetables, and grains.  Do not take Tylenol if you are taking prescription pain medications.  Follow up Your surgeon may want to see you in the office following your access surgery. If so, this will be arranged at the time of your surgery.  Please call us immediately for any of the following conditions:  Increased pain, redness, drainage (pus) from your incision site Fever of 101 degrees or higher Severe or worsening pain at your incision site Hand pain or numbness.  Reduce your risk of vascular disease:  Stop smoking. If you would like help, call [facility] at 1-800-QUIT-NOW 208-251-8346) or [place] at [place] your cholesterol Maintain a desired weight Control your diabetes Keep your blood pressure down  Dialysis  It will take several weeks to several months for your new dialysis access to be ready for use. Your surgeon will determine when it is okay to use it. Your nephrologist will continue to direct your dialysis. You can continue to use your Permcath until your new access is ready for use.   09/24/2020 [name] [id number] 1985/08/25  Surgeon(s): [name], MD  Procedure(s): Left upper arm AV Graft  x Do not stick fistula for 4 weeks    If you have any questions, please call the office at [phone].

## 2020-09-24 NOTE — Op Note (Signed)
OPERATIVE NOTE   PROCEDURE:  re-do left upper arm arteriovenous graft (4 mm x 7 mm Gore-Tex Graft)   PRE-OPERATIVE DIAGNOSIS: end stage renal disease   POST-OPERATIVE DIAGNOSIS: same  SURGEON: [name], MD  ASSISTANT(S): Leontine Locket  ANESTHESIA:  LMA  ESTIMATED BLOOD LOSS: <50 mL  FINDING(S): Left upper arm graft was sewn to the brachial artery just above antecubitum and then tunneled in the upper arm lateral to her existing thrombosed graft and then sewn to the axillary vein.  Good thrill at completion.  Palpable radial pulse 1+.  SPECIMEN(S):  None  INDICATIONS:   [name] is a 35 y.o. female who presents with ESRD and need for permanent hemodialysis acces.  She has multiple failed access procedures in the left arm.  Risk, benefits, and alternatives to access surgery were discussed.  The patient is aware the risks include but are not limited to: bleeding, infection, steal syndrome, nerve damage, ischemic monomelic neuropathy, failure to mature, and need for additional procedures.  The patient is aware of the risks and elects to proceed forward.  DESCRIPTION: After full informed written consent was obtained from the patient, the patient was brought back to the operating room and placed supine upon the operating table.  The patient was given IV antibiotics prior to proceeding.  After obtaining adequate sedation, the patient was prepped and draped in standard fashion for a left arm access procedure.  I evaluated all of the surface veins with ultrasound and they were small as indicated on pre-operative vein mapping.  I turned my attention first to the antecubitum.  Under ultrasound guidance, I identified the location of the brachial artery and marked it on the skin.  I then looked on the upper arm near the axilla and marked the brachial vein as well as axillary vein. I made a longitudinal incision over the brachial artery above the antecubitum and another  longitudinal incision over the brachial vein near the axillary vein.  I dissected down through the subcutaneous tissue and  fascia carefully and was able to dissect out the brachial artery.  The artery was about 3.5 mm externally.  It was controlled proximally and distally with vessel loops.  I then dissected out the larger brachial vein through the upper arm incision.  Externally, it appeared to be 4 mm in diameter.  I then dissected this vein proximally and distal and onto the axillary vein.  I took a Dietitian and dissected from the antecubital incision to the axillary incision lateral to her existing thrombosed graft.  Then I delivered the 4 x 7-mm stretch Gore-Tex graft, through this metal tunneler and then pulled out the metal tunneler leaving the graft in place.  The 4-mm end was left on the brachial artery side and the 7-mm end toward the vein side.  I then gave the patient 5,000 units of heparin to gain anticoagulation.  After waiting 2 minutes, I placed the brachial artery under tension proximally and distally with vessel loops, made an arteriotomy and extended it with a Potts scissor.  I sewed the 4-mm end of the graft to this arteriotomy with a running stitch of 6-0 Prolene.  At this point, then I completed the anastomosis in the usual fashion.  I released the vessel loops on the inflow and allowed the artery to decompress through the graft. There was good pulsatile bleeding through this graft.  I clamped the graft near its arterial anastomosis and sucked out all the blood  in the graft and loaded the graft with heparinized saline.  At this point, I pulled the graft to appropriate length and reset my exposure of the high brachial vein/axillary vein. The vein was controlled with Henle clamps and opened with 11 blade scalpel and extended with Potts scissors.  There was good venous backbleeding from the vein and I did pass a #5 dilator.. I spatulated the graft to facilitate an end-to-side  anastomosis.  In the process of spatulating, I cut the graft to appropriate length for this anastomosis.  This graft was sewn to the vein in an end-to-side configuration with a 6-0 Prolene.  Prior to completing this anastomosis, I allowed the vein to back bleed and then I also allowed the artery to bleed in an antegrade fashion.  I completed this anastomosis in the usual fashion.  I irrigated out both incisions.   The graft had a good palpable thrill.  The radial artery was palpable..  The subcutaneous tissue in each incision was reapproximated with a running stitch of 3-0 Vicryl.  The skin was then reapproximated with a running subcuticular 4-0 Monocryl.  The skin was then cleaned, dried, and Dermabond used to reinforce the skin closure.   COMPLICATIONS: None  CONDITION: Stable   [name], MD [facility] Office: [place]    09/24/2020, 1:04 PM

## 2020-09-24 NOTE — Anesthesia Procedure Notes (Addendum)
Procedure Name: LMA Insertion Date/Time: 09/24/2020 11:01 AM Performed by: Annamary Carolin, CRNA Pre-anesthesia Checklist: Patient identified, Emergency Drugs available, Suction available and Patient being monitored Patient Re-evaluated:Patient Re-evaluated prior to induction Oxygen Delivery Method: Circle System Utilized Preoxygenation: Pre-oxygenation with 100% oxygen Induction Type: IV induction LMA: LMA inserted LMA Size: 4.0 Number of attempts: 1 Airway Equipment and Method: Bite block Placement Confirmation: positive ETCO2 Tube secured with: Tape Dental Injury: Teeth and Oropharynx as per pre-operative assessment

## 2020-09-24 NOTE — Transfer of Care (Signed)
Immediate Anesthesia Transfer of Care Note  Patient: [name]  Procedure(s) Performed: LEFT UPPER ARM ARTERIOVENOUS GRAFT CREATION (Left: Arm Upper)  Patient Location: PACU  Anesthesia Type:General  Level of Consciousness: awake, alert  and patient cooperative  Airway & Oxygen Therapy: Patient Spontanous Breathing and Patient connected to nasal cannula oxygen  Post-op Assessment: Report given to RN, Post -op Vital signs reviewed and stable and Patient moving all extremities  Post vital signs: Reviewed and stable  Last Vitals:  Vitals Value Taken Time  BP 104/75 09/24/20 1309  Temp    Pulse 80 09/24/20 1311  Resp 16 09/24/20 1311  SpO2 100 % 09/24/20 1311  Vitals shown include unvalidated device data.  Last Pain:  Vitals:   09/24/20 0835  TempSrc:   PainSc: 0-No pain         Complications: No notable events documented.

## 2020-09-24 NOTE — OR Nursing (Signed)
Chloroxylenol 3% also known as TECNI-CARE was used for prep due to patient's allergy to shellfish and chlorohexedine. I consulted with Christa MCclellan about these allergies in reference to pre-op site preparation. Christa then consulted with Jefm Petty in pharmacy who stated that Chloroxylenol would be a safe alternative due to it being a different drug class than Chlorohexadine.

## 2020-09-25 ENCOUNTER — Encounter (HOSPITAL_COMMUNITY): Payer: Self-pay | Admitting: Vascular Surgery

## 2020-10-30 ENCOUNTER — Encounter: Payer: Self-pay | Admitting: Neurology

## 2020-10-30 ENCOUNTER — Ambulatory Visit (INDEPENDENT_AMBULATORY_CARE_PROVIDER_SITE_OTHER): Payer: Medicare Other | Admitting: Neurology

## 2020-10-30 VITALS — BP 101/57 | HR 118 | Ht 66.0 in | Wt 177.5 lb

## 2020-10-30 DIAGNOSIS — R569 Unspecified convulsions: Secondary | ICD-10-CM | POA: Diagnosis not present

## 2020-10-30 NOTE — Progress Notes (Signed)
Chief Complaint  Patient presents with   Follow-up    Routine Follow up:  Wants to start driving again Room 16 alone in room    [name] is a 35 y.o. female   Triple X syndrome Type I diabetes, diabetic peripheral neuropathy End-stage renal disease, on hemodialysis Seizure on February 12, 2020  This happened in the setting of hyperglycemia, glucose up to 445  Normal MRI of brain in Dec 2021.   She was not treated with anti-epileptic medications, no recurrent seizures,  The event is most likely related to her hyperglycemia, also reported history of seizure in 2014 in the setting of hyperglycemia.  Repeat EEG was normal in March 2022  Come to clinic as needed.   DIAGNOSTIC DATA (LABS, IMAGING, TESTING) - I reviewed patient records, labs, notes, testing and imaging myself where available.   HISTORICAL  [name] is a 35 year old female, accompanied by her friend Evette Doffing, seen in request by primary care physician Dr. [name], for evaluation of seizure, initial evaluation was on April 29, 2020.  I reviewed and summarized the referring note.  Past medical history End-stage renal disease, on hemodialysis Monday Wednesday Friday History of failed kidney transplant in 2019 Pyelonephritis Anemia of end-stage renal disease Type 1 diabetes Gastroparesis Hypertension Hyperlipidemia Triple X syndrome  She had a witnessed seizure on February 12, 2020, her friend Evette Doffing witnessed the seizure, described that she developed slurred speech, confusion, lasting for few minutes, followed by generalized tonic-clonic seizure activity, postevent confusion, was seen by neuro hospitalist,  she was treated at [facility] extending of severe hypertension, glucose of 445, no antiepileptic medication was given, EEG showed moderate diffuse encephalopathy, no epileptiform discharge  She also reported a seizure in 2014, also happened in the setting of  hyperglycemia, kidney failure, she reported uncontrollable right arm jerking, then went into generalized tonic-clonic seizure,  I personally reviewed MRI of the brain without February 14, 2020 that was normal  UPDATE October 30 2020 She is on Tresiba (long acting insulin 20 units every morning),  Novolog as needed.  She is overall doing well, had no recurrent seizure.  PHYSICAL EXAM   Vitals:   10/30/20 1338  BP: (!) 101/57  Pulse: (!) 118  Weight: 177 lb 8 oz (80.5 kg)  Height: '5\' 6"'$  (1.676 m)   Not recorded     Body mass index is 28.65 kg/m.  PHYSICAL EXAMNIATION:  Gen: NAD, conversant, well nourised, well groomed           NEUROLOGICAL EXAM:  MENTAL STATUS: Speech/cognition:    Speech is normal;  awake alert, oriented to history taking and casual conversation. CRANIAL NERVES: CN II: Visual fields are full to confrontation. Pupils are round equal and briskly reactive to light. CN III, IV, VI: extraocular movement are normal. No ptosis. CN V: Facial sensation is intact to light touch CN VII: Face is symmetric with normal eye closure  CN VIII: Hearing is normal to causal conversation. CN IX, X: Phonation is normal. CN XI: Head turning and shoulder shrug are intact  MOTOR: There is no pronator drift of out-stretched arms. Muscle bulk and tone are normal. Muscle strength is normal. Well healed left AVF.  REFLEXES: Reflexes are 2+ and symmetric at the biceps, triceps, knees, and absent at ankles. Plantar responses are flexor.  SENSORY: Length dependent decreased to light touch, vibratory sensation pinprick to ankle level COORDINATION: There is no trunk or limb dysmetria noted.  GAIT/STANCE:  Need push-up to get up from seated position, mildly antalgic   REVIEW OF SYSTEMS: Full 14 system review of systems performed and notable only for mild All other review of systems were negative.  ALLERGIES: Allergies  Allergen Reactions   Diphenhydramine Anaphylaxis    Doxycycline Shortness Of Breath   Motrin [Ibuprofen] Shortness Of Breath and Itching   Contrast Media [Iodinated Diagnostic Agents] Itching   Shellfish Allergy Hives   Banana Itching, Nausea And Vomiting and Other (See Comments)    Sick on the stomach   Chlorhexidine Itching   Ferrous Sulfate Itching   Iron Dextran Itching and Other (See Comments)    Vein irritation     HOME MEDICATIONS: Current Outpatient Medications  Medication Sig Dispense Refill   ACCU-CHEK SOFTCLIX LANCETS lancets Use to check blood sugar 4 times per day. 150 each 5   acetaminophen (TYLENOL) 500 MG tablet Take 1,000 mg by mouth 2 (two) times daily.     calcium acetate (PHOSLO) 667 MG capsule Take 1,334 mg by mouth 2 (two) times daily.     Calcium Carbonate Antacid (CALCIUM CARBONATE, DOSED IN MG ELEMENTAL CALCIUM,) 1250 MG/5ML SUSP Take 500 mg of elemental calcium by mouth daily. 5 ml     cetirizine (ZYRTEC) 10 MG tablet Take 10 mg by mouth daily as needed for allergies.     famotidine (PEPCID) 40 MG tablet Take 40 mg by mouth at bedtime.     fluticasone (FLONASE) 50 MCG/ACT nasal spray Place 2 sprays into both nostrils daily as needed for allergies.     gabapentin (NEURONTIN) 400 MG capsule Take 400 mg by mouth 3 (three) times daily.     hydrOXYzine (ATARAX/VISTARIL) 50 MG tablet Take 1 tablet (50 mg total) by mouth 2 (two) times daily as needed for itching. 30 tablet 0   insulin aspart (NOVOLOG) 100 UNIT/ML FlexPen Inject 2-10 Units into the skin 3 (three) times daily with meals. Sliding scale     insulin degludec (TRESIBA) 100 UNIT/ML FlexTouch Pen Inject 20 Units into the skin daily.     Insulin Pen Needle (BD PEN NEEDLE NANO 2ND GEN) 32G X 4 MM MISC 1 each by Does not apply route in the morning, at noon, in the evening, and at bedtime. 100 each 2   loperamide (IMODIUM) 1 MG/5ML solution Take 2 mg by mouth 2 (two) times daily as needed for diarrhea or loose stools.      metoprolol tartrate (LOPRESSOR) 25 MG  tablet Take 1 tablet (25 mg total) by mouth 2 (two) times daily. 60 tablet 3   Multiple Vitamins-Minerals (EQ MULTIVITAMINS ADULT GUMMY PO) Take 2 tablets by mouth daily.     oxyCODONE-acetaminophen (PERCOCET) 5-325 MG tablet Take 1 tablet by mouth every 6 (six) hours as needed. 20 tablet 0   pantoprazole (PROTONIX) 40 MG tablet Take 1 tablet (40 mg total) by mouth 2 (two) times daily. 60 tablet 2   promethazine (PHENERGAN) 25 MG tablet Take 25 mg by mouth every 6 (six) hours as needed for nausea or vomiting.      No current facility-administered medications for this visit.    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   Pyelonephritis 06/23/2014   Seasonal allergies    UTI (urinary tract infection) 01/09/2015   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   Qualifier: Diagnosis of  By: Carlena Sax  MD, Colletta [place]      PAST SURGICAL HISTORY: Past Surgical History:  Procedure Laterality Date   ARTERIOVENOUS GRAFT PLACEMENT Bilateral    "neither work" (10/24/2017)   AV FISTULA PLACEMENT Left 10/26/2018   Procedure: CREATION OF ARTERIOVENOUS FISTULA  LEFT ARM;  Surgeon: [name], MD;  Location: [facility];  Service: Vascular;  Laterality: Left;   AV FISTULA PLACEMENT Left 05/23/2020   Procedure: LEFT ARM ARTERIOVENOUS (AV) FISTULA CREATION;  Surgeon: [name], MD;  Location: [facility];  Service: Vascular;  Laterality: Left;   [place] Left 12/21/2018   Procedure: Left arm BASILIC VEIN TRANSPOSITION SECOND STAGE;  Surgeon: [name], MD;  Location: [facility];  Service: Vascular;  Laterality:  Left;   CHOLECYSTECTOMY N/A 06/30/2017   Procedure: LAPAROSCOPIC CHOLECYSTECTOMY WITH INTRAOPERATIVE CHOLANGIOGRAM;  Surgeon: Excell Seltzer, MD;  Location: [facility];  Service: General;  Laterality: N/A;   ESOPHAGOGASTRODUODENOSCOPY (EGD) WITH PROPOFOL N/A 07/04/2017   Procedure: ESOPHAGOGASTRODUODENOSCOPY (EGD) WITH PROPOFOL;  Surgeon: Clarene Essex, MD;  Location: [facility];  Service: Endoscopy;  Laterality: N/A;   ESOPHAGOGASTRODUODENOSCOPY (EGD) WITH PROPOFOL N/A 08/10/2020   Procedure: ESOPHAGOGASTRODUODENOSCOPY (EGD) WITH PROPOFOL;  Surgeon: Doran Stabler, MD;  Location: [facility];  Service: Gastroenterology;  Laterality: N/A;   INSERTION OF DIALYSIS CATHETER N/A 03/20/2018   Procedure: INSERTION OF TUNNELED DIALYSIS CATHETER - RIGHT INTERANL JUGULAR PLACEMENT;  Surgeon: Angelia Mould, MD;  Location: [facility];  Service: Vascular;  Laterality: N/A;   IR FLUORO GUIDE CV LINE RIGHT  04/18/2020   IR GUIDED DRAIN W CATHETER PLACEMENT  10/28/2018   KIDNEY TRANSPLANT  2007   PARATHYROIDECTOMY  ?2012   "3/4 removed" (10/24/2017)   RENAL BIOPSY Bilateral 2003   REVISON OF ARTERIOVENOUS FISTULA Left 09/24/2020   Procedure: LEFT UPPER ARM ARTERIOVENOUS GRAFT CREATION;  Surgeon: [name], MD;  Location: [facility];  Service: Vascular;  Laterality: Left;   UPPER EXTREMITY VENOGRAPHY Bilateral 10/19/2018   Procedure: UPPER EXTREMITY VENOGRAPHY;  Surgeon: [name], MD;  Location: [facility];  Service: Cardiovascular;  Laterality: Bilateral;  Bilateral    UPPER EXTREMITY VENOGRAPHY Left 09/04/2020   Procedure: UPPER EXTREMITY VENOGRAPHY - Left Upper;  Surgeon: [name], MD;  Location: [facility];  Service: Cardiovascular;  Laterality: Left;    FAMILY HISTORY: Family History  Problem Relation Age of Onset   Arthritis Mother    Hypertension Mother    Aneurysm Mother        died of brain aneurysm   CAD Father        Has 3 stents   Diabetes Father         borderline   Early death Brother        Died in war    SOCIAL HISTORY: Social History   Socioeconomic History   Marital status: Single    Spouse name: Not on file   Number of children: Not on file   Years of education: Not on file   Highest education level: Not on file  Occupational History   Occupation: Scientist, 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    Employer: [employer]  Tobacco Use   Smoking status: Never   Smokeless tobacco: Never  Vaping Use   Vaping Use: Never used  Substance and Sexual Activity   Alcohol use: No   Drug use: No   Sexual activity: Yes    Birth control/protection: None  Other Topics Concern   Not on file  Social History Narrative   Patient reports that she is single,    Right Handed   Drinks caffeine rarely   Social Determinants of Health   Financial Resource Strain: Not on file  Food Insecurity: Not on file  Transportation Needs: Not on file  Physical Activity: Not on file  Stress: Not on file  Social Connections: Not on file  Intimate Partner Violence: Not on file     Marcial Pacas, M.D. Ph.D.  [facility] [address], [place], [place] [address] Ph: [phone] Fax: [phone]  CC:  Lorenza [name], MD [address] [place],  [place] [address]

## 2020-11-05 ENCOUNTER — Emergency Department (HOSPITAL_COMMUNITY): Payer: Medicare Other

## 2020-11-05 ENCOUNTER — Observation Stay (HOSPITAL_COMMUNITY)
Admission: EM | Admit: 2020-11-05 | Discharge: 2020-11-06 | Disposition: A | Discharge status: Home / Self Care | Payer: Medicare Other | Attending: Emergency Medicine | Admitting: Emergency Medicine

## 2020-11-05 ENCOUNTER — Other Ambulatory Visit: Payer: Self-pay

## 2020-11-05 DIAGNOSIS — E875 Hyperkalemia: Secondary | ICD-10-CM | POA: Diagnosis present

## 2020-11-05 DIAGNOSIS — K2101 Gastro-esophageal reflux disease with esophagitis, with bleeding: Secondary | ICD-10-CM | POA: Diagnosis not present

## 2020-11-05 DIAGNOSIS — E876 Hypokalemia: Secondary | ICD-10-CM

## 2020-11-05 DIAGNOSIS — K922 Gastrointestinal hemorrhage, unspecified: Secondary | ICD-10-CM

## 2020-11-05 DIAGNOSIS — K208 Other esophagitis without bleeding: Secondary | ICD-10-CM | POA: Diagnosis present

## 2020-11-05 DIAGNOSIS — IMO0002 Reserved for concepts with insufficient information to code with codable children: Secondary | ICD-10-CM

## 2020-11-05 DIAGNOSIS — E1022 Type 1 diabetes mellitus with diabetic chronic kidney disease: Secondary | ICD-10-CM | POA: Diagnosis not present

## 2020-11-05 DIAGNOSIS — E1143 Type 2 diabetes mellitus with diabetic autonomic (poly)neuropathy: Secondary | ICD-10-CM

## 2020-11-05 DIAGNOSIS — K920 Hematemesis: Secondary | ICD-10-CM | POA: Diagnosis present

## 2020-11-05 DIAGNOSIS — K2100 Gastro-esophageal reflux disease with esophagitis, without bleeding: Secondary | ICD-10-CM | POA: Diagnosis present

## 2020-11-05 DIAGNOSIS — Z79899 Other long term (current) drug therapy: Secondary | ICD-10-CM | POA: Diagnosis not present

## 2020-11-05 DIAGNOSIS — E109 Type 1 diabetes mellitus without complications: Secondary | ICD-10-CM | POA: Diagnosis present

## 2020-11-05 DIAGNOSIS — E108 Type 1 diabetes mellitus with unspecified complications: Secondary | ICD-10-CM

## 2020-11-05 DIAGNOSIS — Z940 Kidney transplant status: Secondary | ICD-10-CM | POA: Insufficient documentation

## 2020-11-05 DIAGNOSIS — N186 End stage renal disease: Secondary | ICD-10-CM | POA: Diagnosis not present

## 2020-11-05 DIAGNOSIS — G8929 Other chronic pain: Secondary | ICD-10-CM | POA: Diagnosis present

## 2020-11-05 DIAGNOSIS — Z992 Dependence on renal dialysis: Secondary | ICD-10-CM | POA: Diagnosis not present

## 2020-11-05 DIAGNOSIS — K3184 Gastroparesis: Secondary | ICD-10-CM

## 2020-11-05 DIAGNOSIS — K2971 Gastritis, unspecified, with bleeding: Secondary | ICD-10-CM

## 2020-11-05 DIAGNOSIS — Z20822 Contact with and (suspected) exposure to covid-19: Secondary | ICD-10-CM | POA: Diagnosis not present

## 2020-11-05 DIAGNOSIS — I120 Hypertensive chronic kidney disease with stage 5 chronic kidney disease or end stage renal disease: Secondary | ICD-10-CM | POA: Insufficient documentation

## 2020-11-05 DIAGNOSIS — Z794 Long term (current) use of insulin: Secondary | ICD-10-CM | POA: Insufficient documentation

## 2020-11-05 DIAGNOSIS — E1065 Type 1 diabetes mellitus with hyperglycemia: Secondary | ICD-10-CM

## 2020-11-05 LAB — PROTIME-INR
INR: 1.2 (ref 0.8–1.2)
Prothrombin Time: 14.8 seconds (ref 11.4–15.2)

## 2020-11-05 LAB — I-STAT CHEM 8, ED
BUN: 67 mg/dL — ABNORMAL HIGH (ref 6–20)
Calcium, Ion: 0.93 mmol/L — ABNORMAL LOW (ref 1.15–1.40)
Chloride: 95 mmol/L — ABNORMAL LOW (ref 98–111)
Creatinine, Ser: 11.5 mg/dL — ABNORMAL HIGH (ref 0.44–1.00)
Glucose, Bld: 657 mg/dL (ref 70–99)
HCT: 51 % — ABNORMAL HIGH (ref 36.0–46.0)
Hemoglobin: 17.3 g/dL — ABNORMAL HIGH (ref 12.0–15.0)
Potassium: 5.8 mmol/L — ABNORMAL HIGH (ref 3.5–5.1)
Sodium: 134 mmol/L — ABNORMAL LOW (ref 135–145)
TCO2: 27 mmol/L (ref 22–32)

## 2020-11-05 LAB — CBC WITH DIFFERENTIAL/PLATELET
Abs Immature Granulocytes: 0.06 10*3/uL (ref 0.00–0.07)
Basophils Absolute: 0.1 10*3/uL (ref 0.0–0.1)
Basophils Relative: 0 %
Eosinophils Absolute: 0 10*3/uL (ref 0.0–0.5)
Eosinophils Relative: 0 %
HCT: 47.1 % — ABNORMAL HIGH (ref 36.0–46.0)
Hemoglobin: 14.9 g/dL (ref 12.0–15.0)
Immature Granulocytes: 0 %
Lymphocytes Relative: 11 %
Lymphs Abs: 1.6 10*3/uL (ref 0.7–4.0)
MCH: 29.6 pg (ref 26.0–34.0)
MCHC: 31.6 g/dL (ref 30.0–36.0)
MCV: 93.6 fL (ref 80.0–100.0)
Monocytes Absolute: 0.6 10*3/uL (ref 0.1–1.0)
Monocytes Relative: 4 %
Neutro Abs: 12 10*3/uL — ABNORMAL HIGH (ref 1.7–7.7)
Neutrophils Relative %: 85 %
Platelets: 172 10*3/uL (ref 150–400)
RBC: 5.03 MIL/uL (ref 3.87–5.11)
RDW: 19.5 % — ABNORMAL HIGH (ref 11.5–15.5)
WBC: 14.3 10*3/uL — ABNORMAL HIGH (ref 4.0–10.5)
nRBC: 0 % (ref 0.0–0.2)

## 2020-11-05 LAB — TYPE AND SCREEN
ABO/RH(D): A POS
Antibody Screen: NEGATIVE

## 2020-11-05 LAB — I-STAT VENOUS BLOOD GAS, ED
Acid-Base Excess: 5 mmol/L — ABNORMAL HIGH (ref 0.0–2.0)
Bicarbonate: 28 mmol/L (ref 20.0–28.0)
Calcium, Ion: 0.96 mmol/L — ABNORMAL LOW (ref 1.15–1.40)
HCT: 56 % — ABNORMAL HIGH (ref 36.0–46.0)
Hemoglobin: 19 g/dL — ABNORMAL HIGH (ref 12.0–15.0)
O2 Saturation: 95 %
Potassium: 5.8 mmol/L — ABNORMAL HIGH (ref 3.5–5.1)
Sodium: 134 mmol/L — ABNORMAL LOW (ref 135–145)
TCO2: 29 mmol/L (ref 22–32)
pCO2, Ven: 35.5 mmHg — ABNORMAL LOW (ref 44.0–60.0)
pH, Ven: 7.505 — ABNORMAL HIGH (ref 7.250–7.430)
pO2, Ven: 69 mmHg — ABNORMAL HIGH (ref 32.0–45.0)

## 2020-11-05 LAB — COMPREHENSIVE METABOLIC PANEL
ALT: 13 U/L (ref 0–44)
AST: 24 U/L (ref 15–41)
Albumin: 4.2 g/dL (ref 3.5–5.0)
Alkaline Phosphatase: 91 U/L (ref 38–126)
Anion gap: 22 — ABNORMAL HIGH (ref 5–15)
BUN: 66 mg/dL — ABNORMAL HIGH (ref 6–20)
CO2: 24 mmol/L (ref 22–32)
Calcium: 9.8 mg/dL (ref 8.9–10.3)
Chloride: 87 mmol/L — ABNORMAL LOW (ref 98–111)
Creatinine, Ser: 11.12 mg/dL — ABNORMAL HIGH (ref 0.44–1.00)
GFR, Estimated: 4 mL/min — ABNORMAL LOW (ref >60)
Glucose, Bld: 665 mg/dL (ref 70–99)
Potassium: 5.8 mmol/L — ABNORMAL HIGH (ref 3.5–5.1)
Sodium: 133 mmol/L — ABNORMAL LOW (ref 135–145)
Total Bilirubin: 0.7 mg/dL (ref 0.3–1.2)
Total Protein: 9.7 g/dL — ABNORMAL HIGH (ref 6.5–8.1)

## 2020-11-05 LAB — RESP PANEL BY RT-PCR (FLU A&B, COVID) ARPGX2
Influenza A by PCR: NEGATIVE
Influenza B by PCR: NEGATIVE
SARS Coronavirus 2 by RT PCR: NEGATIVE

## 2020-11-05 LAB — HEMOGLOBIN A1C
Hgb A1c MFr Bld: 9.2 % — ABNORMAL HIGH (ref 4.8–5.6)
Mean Plasma Glucose: 217.34 mg/dL

## 2020-11-05 LAB — HEMOGLOBIN AND HEMATOCRIT, BLOOD
HCT: 43.7 % (ref 36.0–46.0)
Hemoglobin: 13.3 g/dL (ref 12.0–15.0)

## 2020-11-05 LAB — GLUCOSE, CAPILLARY: Glucose-Capillary: 239 mg/dL — ABNORMAL HIGH (ref 70–99)

## 2020-11-05 LAB — CBG MONITORING, ED
Glucose-Capillary: 392 mg/dL — ABNORMAL HIGH (ref 70–99)
Glucose-Capillary: 273 mg/dL — ABNORMAL HIGH (ref 70–99)

## 2020-11-05 LAB — LIPASE, BLOOD: Lipase: 61 U/L — ABNORMAL HIGH (ref 11–51)

## 2020-11-05 LAB — HCG, QUANTITATIVE, PREGNANCY: hCG, Beta Chain, Quant, S: 10 m[IU]/mL — ABNORMAL HIGH (ref <5)

## 2020-11-05 LAB — I-STAT BETA HCG BLOOD, ED (MC, WL, AP ONLY): I-stat hCG, quantitative: 7.2 m[IU]/mL — ABNORMAL HIGH (ref <5)

## 2020-11-05 MED ORDER — LORATADINE 10 MG PO TABS: 10.0000 mg | ORAL_TABLET | Freq: Every day | ORAL | Status: DC | PRN

## 2020-11-05 MED ORDER — OXYCODONE-ACETAMINOPHEN 5-325 MG PO TABS: 1.0000 | ORAL_TABLET | Freq: Four times a day (QID) | ORAL | Status: DC | PRN

## 2020-11-05 MED ORDER — SODIUM CHLORIDE 0.9 % IV BOLUS
1000.0000 mL | Freq: Once | INTRAVENOUS | Status: AC
  Administered 2020-11-05: 1000 mL via INTRAVENOUS

## 2020-11-05 MED ORDER — SODIUM CHLORIDE 0.9 % IV SOLN: Freq: Once | INTRAVENOUS | Status: DC

## 2020-11-05 MED ORDER — NALOXONE HCL 0.4 MG/ML IJ SOLN: 0.4000 mg | INTRAMUSCULAR | Status: DC | PRN

## 2020-11-05 MED ORDER — INSULIN ASPART 100 UNIT/ML IJ SOLN
0.0000 [IU] | Freq: Three times a day (TID) | INTRAMUSCULAR | Status: DC
  Administered 2020-11-05: 3 [IU] via SUBCUTANEOUS
  Administered 2020-11-06: 2 [IU] via SUBCUTANEOUS
  Administered 2020-11-06: 1 [IU] via SUBCUTANEOUS

## 2020-11-05 MED ORDER — HYDROXYZINE HCL 25 MG PO TABS: 50.0000 mg | ORAL_TABLET | Freq: Two times a day (BID) | ORAL | Status: DC | PRN

## 2020-11-05 MED ORDER — FLUTICASONE PROPIONATE 50 MCG/ACT NA SUSP
2.0000 | Freq: Every day | NASAL | Status: DC | PRN
  Filled 2020-11-05: qty 16

## 2020-11-05 MED ORDER — ACETAMINOPHEN 650 MG RE SUPP: 650.0000 mg | Freq: Four times a day (QID) | RECTAL | Status: DC | PRN

## 2020-11-05 MED ORDER — MORPHINE SULFATE (PF) 4 MG/ML IV SOLN
4.0000 mg | Freq: Once | INTRAVENOUS | Status: AC
  Administered 2020-11-05: 4 mg via INTRAVENOUS
  Filled 2020-11-05: qty 1

## 2020-11-05 MED ORDER — INSULIN DEGLUDEC 100 UNIT/ML ~~LOC~~ SOPN: 20.0000 [IU] | PEN_INJECTOR | Freq: Every day | SUBCUTANEOUS | Status: DC

## 2020-11-05 MED ORDER — INSULIN ASPART 100 UNIT/ML IJ SOLN
8.0000 [IU] | Freq: Once | INTRAMUSCULAR | Status: AC
  Administered 2020-11-05: 8 [IU] via SUBCUTANEOUS

## 2020-11-05 MED ORDER — SODIUM CHLORIDE 0.9 % IV SOLN: INTRAVENOUS | Status: DC

## 2020-11-05 MED ORDER — HYDROMORPHONE HCL 1 MG/ML IJ SOLN
1.0000 mg | INTRAMUSCULAR | Status: DC | PRN
  Administered 2020-11-05: 1 mg via INTRAVENOUS
  Filled 2020-11-05: qty 1

## 2020-11-05 MED ORDER — CHLORHEXIDINE GLUCONATE CLOTH 2 % EX PADS
6.0000 | MEDICATED_PAD | Freq: Every day | CUTANEOUS | Status: DC
  Administered 2020-11-06: 6 via TOPICAL

## 2020-11-05 MED ORDER — PANTOPRAZOLE 80MG IVPB - SIMPLE MED
80.0000 mg | Freq: Once | INTRAVENOUS | Status: AC
  Administered 2020-11-05: 80 mg via INTRAVENOUS
  Filled 2020-11-05: qty 80

## 2020-11-05 MED ORDER — PANTOPRAZOLE INFUSION (NEW) - SIMPLE MED
8.0000 mg/h | INTRAVENOUS | Status: DC
  Administered 2020-11-05 – 2020-11-06 (×3): 8 mg/h via INTRAVENOUS
  Filled 2020-11-05: qty 100
  Filled 2020-11-05 (×3): qty 80

## 2020-11-05 MED ORDER — ACETAMINOPHEN 325 MG PO TABS: 650.0000 mg | ORAL_TABLET | Freq: Four times a day (QID) | ORAL | Status: DC | PRN

## 2020-11-05 MED ORDER — METOPROLOL TARTRATE 25 MG PO TABS
25.0000 mg | ORAL_TABLET | Freq: Two times a day (BID) | ORAL | Status: DC
  Administered 2020-11-05: 25 mg via ORAL
  Filled 2020-11-05 (×2): qty 1

## 2020-11-05 MED ORDER — PROCHLORPERAZINE EDISYLATE 10 MG/2ML IJ SOLN
10.0000 mg | Freq: Once | INTRAMUSCULAR | Status: AC
  Administered 2020-11-05: 10 mg via INTRAVENOUS
  Filled 2020-11-05: qty 2

## 2020-11-05 MED ORDER — SODIUM CHLORIDE 0.9% FLUSH
3.0000 mL | Freq: Two times a day (BID) | INTRAVENOUS | Status: DC
  Administered 2020-11-06: 3 mL via INTRAVENOUS

## 2020-11-05 MED ORDER — ALBUTEROL SULFATE (2.5 MG/3ML) 0.083% IN NEBU: 2.5000 mg | INHALATION_SOLUTION | Freq: Four times a day (QID) | RESPIRATORY_TRACT | Status: DC | PRN

## 2020-11-05 MED ORDER — INSULIN GLARGINE-YFGN 100 UNIT/ML ~~LOC~~ SOLN
20.0000 [IU] | Freq: Every day | SUBCUTANEOUS | Status: DC
  Administered 2020-11-05 – 2020-11-06 (×2): 20 [IU] via SUBCUTANEOUS
  Filled 2020-11-05 (×3): qty 0.2

## 2020-11-05 MED ORDER — ONDANSETRON HCL 4 MG/2ML IJ SOLN
4.0000 mg | Freq: Once | INTRAMUSCULAR | Status: AC
  Administered 2020-11-05: 4 mg via INTRAVENOUS
  Filled 2020-11-05: qty 2

## 2020-11-05 MED ORDER — NALOXONE HCL 0.4 MG/ML IJ SOLN
INTRAMUSCULAR | Status: AC
  Administered 2020-11-05: 0.4 mg via INTRAVENOUS
  Filled 2020-11-05: qty 1

## 2020-11-05 MED ORDER — CALCIUM ACETATE (PHOS BINDER) 667 MG PO CAPS
1334.0000 mg | ORAL_CAPSULE | Freq: Two times a day (BID) | ORAL | Status: DC
  Administered 2020-11-06 (×2): 1334 mg via ORAL
  Filled 2020-11-05 (×2): qty 2

## 2020-11-05 MED ORDER — SODIUM ZIRCONIUM CYCLOSILICATE 10 G PO PACK
10.0000 g | PACK | Freq: Three times a day (TID) | ORAL | Status: AC
  Administered 2020-11-05 – 2020-11-06 (×2): 10 g via ORAL
  Filled 2020-11-05 (×2): qty 1

## 2020-11-05 MED ORDER — GABAPENTIN 300 MG PO CAPS
300.0000 mg | ORAL_CAPSULE | Freq: Three times a day (TID) | ORAL | Status: DC
  Administered 2020-11-05 – 2020-11-06 (×3): 300 mg via ORAL
  Filled 2020-11-05 (×3): qty 1

## 2020-11-05 NOTE — ED Notes (Signed)
Beverlee Nims RN updated re: mentation/ Narcan given

## 2020-11-05 NOTE — H&P (Signed)
History and Physical    [name] [id number] DOB: 07-25-1985 DOA: 11/05/2020  Referring MD/NP/PA: Isla Pence, MD PCP: [name], MD  Patient coming from: home  Chief Complaint: Vomiting blood  I have personally briefly reviewed patient's old medical records in [place]   HPI: [name] is a 35 y.o. 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  She reported having acute worsening of epigastric abdominal pain in addition to nausea and vomiting.  Emesis initially was clear, but became dark and bloody appearing overnight.  She reports having at least 3-4 episodes of nonbloody emesis prior to getting to the hospital.  She was unable to keep any of her meds down, but before she had started vomiting her blood sugar was around 216.  Patient reports having similar symptoms when hospitalized in May.  EGD revealed severe esophagitis.  She reports that she last dialyzed on 8/22 and denies any complaints of shortness of breath.  Patient also reports having severe right leg pain related with the femoral hemodialysis catheter that is in place. She just recently had a fistula placed in her left upper arm on 7/13 by Dr. Carlis Abbott, but it has not been used yet , ED Course: On admission into the emergency department patient was seen to be afebrile, pulse elevated up to 130, blood pressures elevated up to 158/117, and all other vital signs maintained.  Labs significant for WBC 14.3, hemoglobin 14.9, sodium 133, potassium 5.8, CO2 24, BUN 66, creatinine 11.12, glucose 665, and anion gap 22, and i-STAT hCG 7.2.  Venous pH 7.505.  [facility] have been formally consulted.  Patient has been given 1 L normal saline IV fluids, Zofran, morphine, insulin aspart 8 units subcu,  and started on a Protonix drip.  TRH called to admit.  Review of Systems  Constitutional:  Negative for fever and malaise/fatigue.  HENT:  Negative for ear discharge and nosebleeds.   Respiratory:  Negative for shortness of breath.   Cardiovascular:  Negative for chest pain and leg swelling.  Gastrointestinal:  Positive for abdominal pain, nausea and vomiting.  Genitourinary:  Negative for dysuria and frequency.  Musculoskeletal:  Positive for myalgias. Negative for joint pain.  Skin:  Negative for rash.  Neurological:  Negative for focal weakness and loss of consciousness.  Psychiatric/Behavioral:  The patient has insomnia.    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   LEARNING DISABILITY 09/25/2007   Qualifier: Diagnosis of  By: Deborra Medina MD, Talia     Pseudoseizures ([place]) 12/22/2012   Pyelonephritis 06/23/2014   Seasonal allergies    UTI (urinary tract infection) 01/09/2015   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   Qualifier: Diagnosis of  By: Carlena Sax  MD, Colletta [place]      Past Surgical History:  Procedure Laterality Date   ARTERIOVENOUS GRAFT PLACEMENT Bilateral    "neither work" (10/24/2017)   AV FISTULA PLACEMENT Left 10/26/2018   Procedure: CREATION OF ARTERIOVENOUS FISTULA  LEFT ARM;  Surgeon: [name], MD;  Location: [facility];  Service: Vascular;  Laterality: Left;   AV FISTULA PLACEMENT Left 05/23/2020   Procedure: LEFT ARM ARTERIOVENOUS (AV) FISTULA CREATION;  Surgeon: [name], MD;  Location: [facility];  Service: Vascular;  Laterality: Left;   [place] Left 12/21/2018   Procedure: Left arm BASILIC VEIN TRANSPOSITION SECOND STAGE;  Surgeon: [name], MD;  Location: [facility];  Service: Vascular;   Laterality: Left;   CHOLECYSTECTOMY N/A 06/30/2017   Procedure: LAPAROSCOPIC CHOLECYSTECTOMY WITH INTRAOPERATIVE CHOLANGIOGRAM;  Surgeon: Excell Seltzer, MD;  Location: [facility];  Service: General;  Laterality: N/A;   ESOPHAGOGASTRODUODENOSCOPY (EGD) WITH PROPOFOL N/A 07/04/2017   Procedure: ESOPHAGOGASTRODUODENOSCOPY (EGD) WITH PROPOFOL;  Surgeon: Clarene Essex, MD;  Location: [facility];  Service: Endoscopy;  Laterality: N/A;   ESOPHAGOGASTRODUODENOSCOPY (EGD) WITH PROPOFOL N/A 08/10/2020   Procedure: ESOPHAGOGASTRODUODENOSCOPY (EGD) WITH PROPOFOL;  Surgeon: Doran Stabler, MD;  Location: [facility];  Service: Gastroenterology;  Laterality: N/A;   INSERTION OF DIALYSIS CATHETER N/A 03/20/2018   Procedure: INSERTION OF TUNNELED DIALYSIS CATHETER - RIGHT INTERANL JUGULAR PLACEMENT;  Surgeon: Angelia Mould, MD;  Location: [place] [facility];  Service: Vascular;  Laterality: N/A;   IR FLUORO GUIDE CV LINE RIGHT  04/18/2020   IR GUIDED DRAIN W CATHETER PLACEMENT  10/28/2018   KIDNEY TRANSPLANT  2007   PARATHYROIDECTOMY  ?2012   "3/4 removed" (10/24/2017)   RENAL BIOPSY Bilateral 2003   REVISON OF ARTERIOVENOUS FISTULA Left 09/24/2020   Procedure: LEFT UPPER ARM ARTERIOVENOUS GRAFT CREATION;  Surgeon: [name], MD;  Location: [facility];  Service: Vascular;  Laterality: Left;   UPPER EXTREMITY VENOGRAPHY Bilateral 10/19/2018   Procedure: UPPER EXTREMITY VENOGRAPHY;  Surgeon: [name], MD;  Location: [facility];  Service: Cardiovascular;  Laterality: Bilateral;  Bilateral    UPPER EXTREMITY VENOGRAPHY Left 09/04/2020   Procedure: UPPER EXTREMITY VENOGRAPHY - Left Upper;  Surgeon: [name], MD;  Location: [facility];  Service: Cardiovascular;  Laterality: Left;     reports that she has never smoked. She has never used smokeless tobacco. She reports that she does not drink alcohol and does not use drugs.  Allergies  Allergen Reactions   Diphenhydramine  Anaphylaxis   Doxycycline Shortness Of Breath   Motrin [Ibuprofen] Shortness Of Breath and Itching   Contrast Media [Iodinated Diagnostic Agents] Itching   Shellfish Allergy Hives   Banana Itching, Nausea And Vomiting and Other (See Comments)    Sick on the stomach   Chlorhexidine Itching   Ferrous Sulfate Itching   Iron Dextran Itching and Other (See Comments)    Vein irritation     Family History  Problem Relation Age of Onset   Arthritis Mother    Hypertension Mother    Aneurysm Mother        died of brain aneurysm   CAD Father        Has 3 stents   Diabetes Father        borderline   Early death Brother        Died in war    Prior to Admission medications   Medication Sig Start Date End Date Taking? Authorizing Provider  gabapentin (NEURONTIN) 300 MG capsule Take 300 mg by mouth 3 (three) times daily.   Yes [provider]  ACCU-CHEK SOFTCLIX LANCETS lancets Use to check blood sugar 4 times per day. 12/29/15   Renato Shin, MD  acetaminophen (TYLENOL) 500 MG tablet Take 1,000 mg by  mouth 2 (two) times daily.    [provider]  calcium acetate (PHOSLO) 667 MG capsule Take 1,334 mg by mouth 2 (two) times daily. 08/18/20   [provider]  Calcium Carbonate Antacid (CALCIUM CARBONATE, DOSED IN MG ELEMENTAL CALCIUM,) 1250 MG/5ML SUSP Take 500 mg of elemental calcium by mouth daily. 5 ml 08/23/20   [provider]  cetirizine (ZYRTEC) 10 MG tablet Take 10 mg by mouth daily as needed for allergies.    [provider]  famotidine (PEPCID) 40 MG tablet Take 40 mg by mouth at bedtime.    [provider]  fluticasone (FLONASE) 50 MCG/ACT nasal spray Place 2 sprays into both nostrils daily as needed for allergies. 09/03/18   [name], MD  hydrOXYzine (ATARAX/VISTARIL) 50 MG tablet Take 1 tablet (50 mg total) by mouth 2 (two) times daily as needed for itching. 02/06/18   [name], Ripudeep K, MD  insulin aspart (NOVOLOG) 100 UNIT/ML  FlexPen Inject 2-10 Units into the skin 3 (three) times daily with meals. Sliding scale    [provider]  insulin degludec (TRESIBA) 100 UNIT/ML FlexTouch Pen Inject 20 Units into the skin daily. 02/15/20   Dwyane Dee, MD  Insulin Pen Needle (BD PEN NEEDLE NANO 2ND GEN) 32G X 4 MM MISC 1 each by Does not apply route in the morning, at noon, in the evening, and at bedtime. 08/10/20   Barb Merino, MD  loperamide (IMODIUM) 1 MG/5ML solution Take 2 mg by mouth 2 (two) times daily as needed for diarrhea or loose stools.     [provider]  metoprolol tartrate (LOPRESSOR) 25 MG tablet Take 1 tablet (25 mg total) by mouth 2 (two) times daily. 02/15/20   Dwyane Dee, MD  Multiple Vitamins-Minerals (EQ MULTIVITAMINS ADULT GUMMY PO) Take 2 tablets by mouth daily.    [provider]  ondansetron (ZOFRAN-ODT) 4 MG disintegrating tablet Take 4 mg by mouth every 8 (eight) hours as needed for nausea/vomiting. 11/04/20   [provider]  oxyCODONE-acetaminophen (PERCOCET) 5-325 MG tablet Take 1 tablet by mouth every 6 (six) hours as needed. 09/24/20   Rhyne, Hulen Shouts, PA-C  pantoprazole (PROTONIX) 40 MG tablet Take 1 tablet (40 mg total) by mouth 2 (two) times daily. 08/10/20 11/08/20  Barb Merino, MD  promethazine (PHENERGAN) 25 MG tablet Take 25 mg by mouth every 6 (six) hours as needed for nausea or vomiting.  10/13/19   [provider]    Physical Exam:  Constitutional: NAD, calm, comfortable Vitals:   11/05/20 0611 11/05/20 0613 11/05/20 0800  BP:  (!) 160/137 (!) 158/117  Pulse:  (!) 130 (!) 117  Resp:  17 16  Temp:  97.9 F (36.6 C)   SpO2:  100% 100%  Weight: 70.8 kg    Height: '5\' 6"'$  (1.676 m)     Eyes: PERRL, lids and conjunctivae normal ENMT: Mucous membranes are moist. Posterior pharynx clear of any exudate or lesions.Normal dentition.  Neck: normal, supple, no masses, no thyromegaly Respiratory: clear to auscultation bilaterally, no  wheezing, no crackles. Normal respiratory effort. No accessory muscle use.  Cardiovascular: Regular rate and rhythm, no murmurs / rubs / gallops. No extremity edema. 2+ pedal pulses. No carotid bruits.  Abdomen: no tenderness, no masses palpated. No hepatosplenomegaly. Bowel sounds positive.  Musculoskeletal: no clubbing / cyanosis. No joint deformity upper and lower extremities. Good ROM, no contractures. Normal muscle tone.  Skin: no rashes, lesions, ulcers. No induration Neurologic: CN 2-12 grossly intact.  Sensation intact, DTR normal. Strength 5/5 in all 4.  Psychiatric: Normal judgment and insight. Alert and oriented x 3. Normal mood.     Labs on Admission: I have personally reviewed following labs and imaging studies  CBC: Recent Labs  Lab 11/05/20 0710 11/05/20 0714 11/05/20 0754  WBC 14.3*  --   --   NEUTROABS 12.0*  --   --   HGB 14.9 17.3* 19.0*  HCT 47.1* 51.0* 56.0*  MCV 93.6  --   --   PLT 172  --   --    Basic Metabolic Panel: Recent Labs  Lab 11/05/20 0710 11/05/20 0714 11/05/20 0754  NA 133* 134* 134*  K 5.8* 5.8* 5.8*  CL 87* 95*  --   CO2 24  --   --   GLUCOSE 665* 657*  --   BUN 66* 67*  --   CREATININE 11.12* 11.50*  --   CALCIUM 9.8  --   --    GFR: Estimated Creatinine Clearance: 6.3 mL/min (A) (by C-G formula based on SCr of 11.5 mg/dL (H)). Liver Function Tests: Recent Labs  Lab 11/05/20 0710  AST 24  ALT 13  ALKPHOS 91  BILITOT 0.7  PROT 9.7*  ALBUMIN 4.2   Recent Labs  Lab 11/05/20 0710  LIPASE 61*   No results for input(s): AMMONIA in the last 168 hours. Coagulation Profile: No results for input(s): INR, PROTIME in the last 168 hours. Cardiac Enzymes: No results for input(s): CKTOTAL, CKMB, CKMBINDEX, TROPONINI in the last 168 hours. BNP (last 3 results) No results for input(s): PROBNP in the last 8760 hours. HbA1C: No results for input(s): HGBA1C in the last 72 hours. CBG: No results for input(s): GLUCAP in the last 168  hours. Lipid Profile: No results for input(s): CHOL, HDL, LDLCALC, TRIG, CHOLHDL, LDLDIRECT in the last 72 hours. Thyroid Function Tests: No results for input(s): TSH, T4TOTAL, FREET4, T3FREE, THYROIDAB in the last 72 hours. Anemia Panel: No results for input(s): VITAMINB12, FOLATE, FERRITIN, TIBC, IRON, RETICCTPCT in the last 72 hours. Urine analysis:    Component Value Date/Time   COLORURINE RED (A) 09/03/2018 1118   APPEARANCEUR CLOUDY (A) 09/03/2018 1118   LABSPEC 1.015 09/03/2018 1118   PHURINE 8.0 09/03/2018 1118   GLUCOSEU 250 (A) 09/03/2018 1118   HGBUR LARGE (A) 09/03/2018 1118   HGBUR negative 01/26/2010 1026   BILIRUBINUR NEGATIVE 09/03/2018 1118   BILIRUBINUR NEG 01/01/2011 [place] 09/03/2018 1118   PROTEINUR >300 (A) 09/03/2018 1118   UROBILINOGEN 0.2 01/09/2015 1333   NITRITE POSITIVE (A) 09/03/2018 1118   LEUKOCYTESUR TRACE (A) 09/03/2018 1118   Sepsis Labs: No results found for this or any previous visit (from the past 240 hour(s)).   Radiological Exams on Admission: DG Chest Port 1 View  Result Date: 11/05/2020 CLINICAL DATA:  Vomiting blood.  Abdominal pain. EXAM: PORTABLE CHEST 1 VIEW COMPARISON:  None. FINDINGS: Support lines: Cephalad-projecting dialysis catheter, with tip within the RIGHT atrium. Enlarged cardiac silhouette, greater than expected in a patient of this age. low lung volumes. No focal consolidation or mass. No pleural effusion or pneumothorax. Cholecystectomy clips. No acute displaced fracture. IMPRESSION: 1. Femoral tunneled dialysis catheter, with tip within the RIGHT atrium. 2. Cardiomegaly without acute superimposed cardiopulmonary process. Electronically Signed   By: Michaelle Birks M.D.   On: 11/05/2020 08:16    EKG: Independently reviewed.  Sinus tachycardia 128 beats per  Assessment/Plan Hematemesis esophagitis: Acute.  Patient presents with complaints of vomiting  blood.  Labs significant for hemoglobin 14.9-> 17.3->19.   Chest x-ray noted  cardiomegaly without superimposed process.  Prior EGD in May 2022 noted severe esophagitis.  Suspect symptoms possibly secondary to the same versus possible Mallory-Weiss tear. -Admit to a progressive bed -N.p.o. and advance as tolerated to clear liquid diet -Continue Protonix drip -Serial monitoring of H&H -Antiemetics as needed -Appreciate [facility] consultative services, we will follow-up for any further recommendation  Diabetes mellitus type 2 with hyperglycemia: Patient presented with glucose elevated up to 665, CO2 24, anion gap of 22, and venous pH of 7.505.  Patient did not meet criteria for DKA.  Last hemoglobin A1c was noted to be 11 in 07/2020.  Patient had been given 8 units of aspart insulin and at least 1 L of normal saline IV fluids. -Hypoglycemic protocol -Recheck hemoglobin A1c -Continue Tresiba 20 units -CBGs before every meal with moderate SSI  ESRD on HD: Patient normally dialyzes Monday, Wednesday, Friday.  Labs significant for potassium 5.8, BUN 66, creatinine 11.12.  Placement of a left upper extremity fistula on 7/13 by Dr. Carlis Abbott vascular surgery, but patient has a right hemodialysis catheter in place.  Patient reports having significant pain from the catheter. -Nephrology consulted, follow-up for any further recommendations  Hyperkalemia: Acute.  Initial potassium 5.8.  Patient had been given insulin and IV fluids. -Recheck potassium levels  Essential hypertension: Blood pressures initially elevated up to 158/117.  Home blood pressure medication regimen includes metoprolol 25 mg twice daily -Continue metoprolol   Chronic pain: Home medication regimen includes oxycodone 5 mg every 6 hours as needed for pain. -IV Dilaudid as needed severe pain (discontinued due to lethargy) -Resume home oxycodone once able  Elevated beta-hCG: Acute.  I-STAT beta-hCG 7.2 on admission. -Check serum quantitative  DVT prophylaxis: SCDs Code Status: Full Family  Communication: Sister updated over the phone Disposition Plan: Home Consults called: Rudolpho Sevin, nephrology  Admission status: Observation  Norval Morton MD [facility]   If 7PM-7AM, please contact night-coverage   11/05/2020, 9:12 AM

## 2020-11-05 NOTE — Progress Notes (Signed)
Attending paged to obtain npo except medication diet order.

## 2020-11-05 NOTE — ED Notes (Signed)
Pt appears very drowsy right now. Pt will try to speak after stimulation, and states she's just tired. Will only open eyes briefly but will not keep eyes open. Dr Tamala Julian was made aware, VSS. BS=273. Brother seen patient approximately 1 hour ago.   Pt now mentioned that it was a "friend" who visited her.

## 2020-11-05 NOTE — Consult Note (Addendum)
Renal Service Consult Note [facility]  [name] 11/05/2020 [name], MD Requesting Physician: Dr. Tamala Julian, R.  Reason for Consult: ESRD pt w/ hematemesis HPI: The patient is a 35 y.o. 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 Has had similar presentation in the past w/ severe esophagitis by EGD per GI note. Hb 14.9.  K 5.8.  Cr 11.  Seen by GI.  Asked to see for ESRD.    Pt seen in ED, no distress, calm. No SOB or CP.    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   Pyelonephritis 06/23/2014   Seasonal allergies    UTI (urinary tract infection) 01/09/2015   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   Qualifier: Diagnosis of  By: Carlena Sax  MD, Colletta [place]     Past Surgical History  Past Surgical History:  Procedure Laterality Date   ARTERIOVENOUS GRAFT PLACEMENT Bilateral    "neither work" (10/24/2017)   AV FISTULA PLACEMENT Left 10/26/2018   Procedure: CREATION OF ARTERIOVENOUS FISTULA  LEFT ARM;  Surgeon: [name], MD;  Location: [place];  Service: Vascular;  Laterality: Left;   AV FISTULA PLACEMENT Left 05/23/2020   Procedure: LEFT ARM ARTERIOVENOUS (AV) FISTULA CREATION;  Surgeon: [name], MD;  Location: [place];  Service: Vascular;  Laterality: Left;   [place] Left 12/21/2018   Procedure: Left arm BASILIC VEIN TRANSPOSITION  SECOND STAGE;  Surgeon: [name], MD;  Location: [place];  Service: Vascular;  Laterality: Left;   CHOLECYSTECTOMY N/A 06/30/2017   Procedure: LAPAROSCOPIC CHOLECYSTECTOMY WITH INTRAOPERATIVE CHOLANGIOGRAM;  Surgeon: Excell Seltzer, MD;  [facility];  Service: General;  Laterality: N/A;   ESOPHAGOGASTRODUODENOSCOPY (EGD) WITH PROPOFOL N/A 07/04/2017   Procedure: ESOPHAGOGASTRODUODENOSCOPY (EGD) WITH PROPOFOL;  Surgeon: Clarene Essex, MD;  [facility];  Service: Endoscopy;  Laterality: N/A;   ESOPHAGOGASTRODUODENOSCOPY (EGD) WITH PROPOFOL N/A 08/10/2020   Procedure: ESOPHAGOGASTRODUODENOSCOPY (EGD) WITH PROPOFOL;  Surgeon: Doran Stabler, MD;  Location: [place];  Service: Gastroenterology;  Laterality: N/A;   INSERTION OF DIALYSIS CATHETER N/A 03/20/2018   Procedure: INSERTION OF TUNNELED DIALYSIS CATHETER - RIGHT INTERANL JUGULAR PLACEMENT;  Surgeon: Angelia Mould, MD;  Location: [place];  Service: Vascular;  Laterality: N/A;   IR FLUORO GUIDE CV LINE RIGHT  04/18/2020   IR GUIDED DRAIN W CATHETER PLACEMENT  10/28/2018   KIDNEY TRANSPLANT  2007   PARATHYROIDECTOMY  ?2012   "3/4 removed" (10/24/2017)   RENAL BIOPSY Bilateral 2003   REVISON OF ARTERIOVENOUS FISTULA Left 09/24/2020   Procedure: LEFT UPPER ARM ARTERIOVENOUS GRAFT CREATION;  Surgeon: [name], MD;  Location: [place];  Service: Vascular;  Laterality: Left;   UPPER EXTREMITY VENOGRAPHY Bilateral 10/19/2018   Procedure: UPPER EXTREMITY VENOGRAPHY;  Surgeon: [name],  MD;  Location: [place] [facility];  Service: Cardiovascular;  Laterality: Bilateral;  Bilateral    UPPER EXTREMITY VENOGRAPHY Left 09/04/2020   Procedure: UPPER EXTREMITY VENOGRAPHY - Left Upper;  Surgeon: [name], MD;  Location: [place] [facility];  Service: Cardiovascular;  Laterality: Left;   Family History  Family History  Problem Relation Age of Onset   Arthritis Mother    Hypertension Mother     Aneurysm Mother        died of brain aneurysm   CAD Father        Has 3 stents   Diabetes Father        borderline   Early death Brother        Died in war   Social History  reports that she has never smoked. She has never used smokeless tobacco. She reports that she does not drink alcohol and does not use drugs. Allergies  Allergies  Allergen Reactions   Diphenhydramine Anaphylaxis   Doxycycline Shortness Of Breath   Motrin [Ibuprofen] Shortness Of Breath and Itching   Contrast Media [Iodinated Diagnostic Agents] Itching   Shellfish Allergy Hives   Banana Itching, Nausea And Vomiting and Other (See Comments)    Sick on the stomach   Chlorhexidine Itching   Ferrous Sulfate Itching   Iron Dextran Itching and Other (See Comments)    Vein irritation    Home medications Prior to Admission medications   Medication Sig Start Date End Date Taking? Authorizing Provider  acetaminophen (TYLENOL) 500 MG tablet Take 1,000 mg by mouth 2 (two) times daily.   Yes [provider]  calcium acetate (PHOSLO) 667 MG capsule Take 1,334 mg by mouth 2 (two) times daily. 08/18/20  Yes [provider]  Calcium Carbonate Antacid (CALCIUM CARBONATE, DOSED IN MG ELEMENTAL CALCIUM,) 1250 MG/5ML SUSP Take 500 mg of elemental calcium by mouth daily. 5 ml 08/23/20  Yes [provider]  cetirizine (ZYRTEC) 10 MG tablet Take 10 mg by mouth daily as needed for allergies.   Yes [provider]  famotidine (PEPCID) 40 MG tablet Take 40 mg by mouth at bedtime.   Yes [provider]  fluticasone (FLONASE) 50 MCG/ACT nasal spray Place 2 sprays into both nostrils daily as needed for allergies. 09/03/18  Yes [name], MD  gabapentin (NEURONTIN) 300 MG capsule Take 300 mg by mouth 3 (three) times daily.   Yes [provider]  hydrOXYzine (ATARAX/VISTARIL) 50 MG tablet Take 1 tablet (50 mg total) by mouth 2 (two) times daily as needed for itching. 02/06/18  Yes [name],  Ripudeep K, MD  insulin aspart (NOVOLOG) 100 UNIT/ML FlexPen Inject 2-10 Units into the skin 3 (three) times daily with meals. Sliding scale   Yes [provider]  insulin degludec (TRESIBA) 100 UNIT/ML FlexTouch Pen Inject 20 Units into the skin daily. 02/15/20  Yes Dwyane Dee, MD  loperamide (IMODIUM) 1 MG/5ML solution Take 2 mg by mouth 2 (two) times daily as needed for diarrhea or loose stools.    Yes [provider]  metoprolol tartrate (LOPRESSOR) 25 MG tablet Take 1 tablet (25 mg total) by mouth 2 (two) times daily. 02/15/20  Yes Dwyane Dee, MD  Multiple Vitamins-Minerals (EQ MULTIVITAMINS ADULT GUMMY PO) Take 2 tablets by mouth daily.   Yes [provider]  ondansetron (ZOFRAN-ODT) 4 MG disintegrating tablet Take 4 mg by mouth every 8 (eight) hours as needed for nausea/vomiting. 11/04/20  Yes [provider]  oxyCODONE-acetaminophen (PERCOCET)  5-325 MG tablet Take 1 tablet by mouth every 6 (six) hours as needed. Patient taking differently: Take 1 tablet by mouth every 6 (six) hours as needed for moderate pain. 09/24/20  Yes Rhyne, Hulen Shouts, PA-C  pantoprazole (PROTONIX) 40 MG tablet Take 1 tablet (40 mg total) by mouth 2 (two) times daily. 08/10/20 11/08/20 Yes Ghimire, Dante Gang, MD  ACCU-CHEK SOFTCLIX LANCETS lancets Use to check blood sugar 4 times per day. 12/29/15   Renato Shin, MD  Insulin Pen Needle (BD PEN NEEDLE NANO 2ND GEN) 32G X 4 MM MISC 1 each by Does not apply route in the morning, at noon, in the evening, and at bedtime. 08/10/20   Barb Merino, MD     Vitals:   11/05/20 0613 11/05/20 0800 11/05/20 1130 11/05/20 1400  BP: (!) 160/137 (!) 158/117 (!) 143/104 119/90  Pulse: (!) 130 (!) 117 (!) 104   Resp: '17 16 12 19  '$ Temp: 97.9 F (36.6 C)     SpO2: 100% 100% 100% 95%  Weight:      Height:       Exam Gen alert, no distress No rash, cyanosis or gangrene Sclera anicteric, throat clear  No jvd or bruits Chest clear bilat to  bases, no rales/ wheezing RRR no MRG Abd soft ntnd no mass or ascites +bs GU normal MS no joint effusions or deformity Ext no LE or UE edema, no wounds or ulcers Neuro is alert, Ox 3 , nf  LUA AVF maturing/ R fem TDC       Home meds include phoslo 2 ac, pepcid, neurontin 300 tid, novolog insulin and degludec, lopressor, percocet prn, protonix, prns     OP HD:  [facility] [place]    4h  80.5kg  P4  fem TDC/ maturing LUA AVG 09/24/20  Hep 4000  - hect 2 ug  - venofer 50 qwk  - mircera 200 last 8/15     Assessment/ Plan: GIB/ hematemesis - per GI, Hb 14 ESRD - on HD MWF. Has not missed HD. K+ 5.8. Will give lokelma 10gm w/ sip of water. Due to high census/ staff issues, HD will likely be postponed to tomorrow am. No heparin.  Will d/w GI about HD timing also.  Hyperkalemia - mild, as above. If allowed to eat use renal (lowK+) diet please DM2 on insulin HTN - on lopressor only at home, per pmd Anemia ckd - Hb 14, not anemic today      Kelly Splinter  MD 11/05/2020, 3:16 PM  Recent Labs  Lab 11/05/20 0710 11/05/20 0714 11/05/20 0754  WBC 14.3*  --   --   HGB 14.9 17.3* 19.0*   Recent Labs  Lab 11/05/20 0710 11/05/20 0714 11/05/20 0754  K 5.8* 5.8* 5.8*  BUN 66* 67*  --   CREATININE 11.12* 11.50*  --   CALCIUM 9.8  --   --

## 2020-11-05 NOTE — ED Provider Notes (Signed)
[facility] EMERGENCY DEPARTMENT Provider Note   CSN: [id number] Arrival date & time: 11/05/20  0554     History Chief Complaint  Patient presents with   GI Bleeding    [name] is a 35 y.o. female.  HPI Patient with a history of a prior GI bleed in May coming in with hematemesis and abdominal pain. Says that they took too much off of her in dialysis yesterday which caused her to feel nauseous. She started to vomit which was initially yellow but then it became bloody overnight. She is in really bad abdominal pain.    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   Pyelonephritis 06/23/2014   Seasonal allergies    UTI (urinary tract infection) 01/09/2015   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   Qualifier: Diagnosis of  By: Carlena Sax  MD, Colletta [place]      Patient Active Problem List   Diagnosis Date Noted   Seizures ([place]) 10/30/2020   Acute upper GI bleed 08/09/2020   Esophagitis 08/09/2020   DKA, type 2 ([place]) 07/31/2020   Seizure disorder ([place]) 04/29/2020   Polyneuropathy associated with underlying disease ([place]) 04/29/2020   Renal osteodystrophy 04/22/2020   Hiatal hernia 04/22/2020   Overweight (BMI 25.0-29.9) 04/22/2020   Uncontrolled type 1 diabetes mellitus with hyperglycemia ([place]) 04/20/2020   Hyponatremia 04/20/2020   GERD (gastroesophageal reflux disease) 04/20/2020   Prolonged QT interval 04/20/2020   Wide-complex tachycardia ([place])    Allergy, unspecified, initial encounter 11/07/2019   Anaphylactic shock, unspecified, initial encounter 11/07/2019   Hypotension 10/16/2019   Intractable  abdominal pain 06/19/2019   Sinus tachycardia 06/19/2019   Renal and perinephric abscess 11/01/2018   Intra-abdominal abscess ([place]) 10/28/2018   ESRD on hemodialysis ([place]) 05/01/2018   Shortness of breath 04/20/2018   Iron deficiency anemia, unspecified 04/03/2018   Coagulation defect, unspecified ([place]) 0000000   Complication of vascular dialysis catheter 03/27/2018   Dependence on renal dialysis ([place]) 03/27/2018   Kidney transplant failure 03/27/2018   Renal sclerosis, unspecified 03/27/2018   Renal failure 03/18/2018   Cellulitis of left leg 03/01/2018   Hyperlipidemia 03/01/2018   Hypocalcemia [id number]   Metabolic acidosis 99991111   Incontinence of bowel 02/03/2018   Chronic pain 11/11/2017   Diabetic gastroparesis ([facility])    Constipation 07/13/2017   Hematemesis 07/02/2017   Dehydration 07/02/2017   Chronic cholecystitis 06/29/2017   Type 1 diabetes mellitus with complication, uncontrolled ([place]) 05/25/2015   Intractable nausea and vomiting 01/09/2015   Renal transplant recipient    Immunosuppressed status ([place])    ESRD (end stage renal disease) ([place]) 09/30/2014   Chest pain 12/22/2012   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   Sleep-wake schedule disorder, 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   Chronic kidney disease 01/04/2010   OVARIAN FAILURE, PREMATURE 03/11/2009   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   OBESITY 09/25/2007   Anemia due to chronic kidney disease 09/25/2007   LEARNING DISABILITY 09/25/2007   Essential hypertension, benign 09/25/2007    Past Surgical History:  Procedure Laterality Date  ARTERIOVENOUS GRAFT PLACEMENT Bilateral    "neither work" (10/24/2017)   AV FISTULA PLACEMENT Left 10/26/2018   Procedure: CREATION OF ARTERIOVENOUS FISTULA  LEFT ARM;  Surgeon: [name], MD;  Location: [place];  Service: Vascular;  Laterality: Left;   AV FISTULA PLACEMENT Left 05/23/2020   Procedure: LEFT ARM  ARTERIOVENOUS (AV) FISTULA CREATION;  Surgeon: [name], MD;  Location: [place];  Service: Vascular;  Laterality: Left;   [place] Left 12/21/2018   Procedure: Left arm BASILIC VEIN TRANSPOSITION SECOND STAGE;  Surgeon: [name], MD;  Location: [place];  Service: Vascular;  Laterality: Left;   CHOLECYSTECTOMY N/A 06/30/2017   Procedure: LAPAROSCOPIC CHOLECYSTECTOMY WITH INTRAOPERATIVE CHOLANGIOGRAM;  Surgeon: Excell Seltzer, MD;  [facility];  Service: General;  Laterality: N/A;   ESOPHAGOGASTRODUODENOSCOPY (EGD) WITH PROPOFOL N/A 07/04/2017   Procedure: ESOPHAGOGASTRODUODENOSCOPY (EGD) WITH PROPOFOL;  Surgeon: Clarene Essex, MD;  [facility];  Service: Endoscopy;  Laterality: N/A;   ESOPHAGOGASTRODUODENOSCOPY (EGD) WITH PROPOFOL N/A 08/10/2020   Procedure: ESOPHAGOGASTRODUODENOSCOPY (EGD) WITH PROPOFOL;  Surgeon: Doran Stabler, MD;  Location: [place];  Service: Gastroenterology;  Laterality: N/A;   INSERTION OF DIALYSIS CATHETER N/A 03/20/2018   Procedure: INSERTION OF TUNNELED DIALYSIS CATHETER - RIGHT INTERANL JUGULAR PLACEMENT;  Surgeon: Angelia Mould, MD;  Location: [place];  Service: Vascular;  Laterality: N/A;   IR FLUORO GUIDE CV LINE RIGHT  04/18/2020   IR GUIDED DRAIN W CATHETER PLACEMENT  10/28/2018   KIDNEY TRANSPLANT  2007   PARATHYROIDECTOMY  ?2012   "3/4 removed" (10/24/2017)   RENAL BIOPSY Bilateral 2003   REVISON OF ARTERIOVENOUS FISTULA Left 09/24/2020   Procedure: LEFT UPPER ARM ARTERIOVENOUS GRAFT CREATION;  Surgeon: [name], MD;  Location: [place];  Service: Vascular;  Laterality: Left;   UPPER EXTREMITY VENOGRAPHY Bilateral 10/19/2018   Procedure: UPPER EXTREMITY VENOGRAPHY;  Surgeon: [name], MD;  Location: [place] [facility];  Service: Cardiovascular;  Laterality: Bilateral;  Bilateral    UPPER EXTREMITY VENOGRAPHY Left 09/04/2020   Procedure: UPPER EXTREMITY VENOGRAPHY - Left Upper;   Surgeon: [name], MD;  Location: [place] [facility];  Service: Cardiovascular;  Laterality: Left;     OB History     Gravida  0   Para  0   Term  0   Preterm  0   AB  0   Living  0      SAB  0   IAB  0   Ectopic  0   Multiple  0   Live Births  0           Family History  Problem Relation Age of Onset   Arthritis Mother    Hypertension Mother    Aneurysm Mother        died of brain aneurysm   CAD Father        Has 3 stents   Diabetes Father        borderline   Early death Brother        Died in war    Social History   Tobacco Use   Smoking status: Never   Smokeless tobacco: Never  Vaping Use   Vaping Use: Never used  Substance Use Topics   Alcohol use: No   Drug use: No    Home Medications Prior to Admission medications   Medication Sig Start Date End Date Taking? Authorizing Provider  gabapentin (NEURONTIN) 300 MG capsule Take  300 mg by mouth 3 (three) times daily.   Yes [provider]  ACCU-CHEK SOFTCLIX LANCETS lancets Use to check blood sugar 4 times per day. 12/29/15   Renato Shin, MD  acetaminophen (TYLENOL) 500 MG tablet Take 1,000 mg by mouth 2 (two) times daily.    [provider]  calcium acetate (PHOSLO) 667 MG capsule Take 1,334 mg by mouth 2 (two) times daily. 08/18/20   [provider]  Calcium Carbonate Antacid (CALCIUM CARBONATE, DOSED IN MG ELEMENTAL CALCIUM,) 1250 MG/5ML SUSP Take 500 mg of elemental calcium by mouth daily. 5 ml 08/23/20   [provider]  cetirizine (ZYRTEC) 10 MG tablet Take 10 mg by mouth daily as needed for allergies.    [provider]  famotidine (PEPCID) 40 MG tablet Take 40 mg by mouth at bedtime.    [provider]  fluticasone (FLONASE) 50 MCG/ACT nasal spray Place 2 sprays into both nostrils daily as needed for allergies. 09/03/18   [name], MD  hydrOXYzine (ATARAX/VISTARIL) 50 MG tablet Take 1 tablet (50 mg total) by mouth 2  (two) times daily as needed for itching. 02/06/18   [name], Ripudeep K, MD  insulin aspart (NOVOLOG) 100 UNIT/ML FlexPen Inject 2-10 Units into the skin 3 (three) times daily with meals. Sliding scale    [provider]  insulin degludec (TRESIBA) 100 UNIT/ML FlexTouch Pen Inject 20 Units into the skin daily. 02/15/20   Dwyane Dee, MD  Insulin Pen Needle (BD PEN NEEDLE NANO 2ND GEN) 32G X 4 MM MISC 1 each by Does not apply route in the morning, at noon, in the evening, and at bedtime. 08/10/20   Barb Merino, MD  loperamide (IMODIUM) 1 MG/5ML solution Take 2 mg by mouth 2 (two) times daily as needed for diarrhea or loose stools.     [provider]  metoprolol tartrate (LOPRESSOR) 25 MG tablet Take 1 tablet (25 mg total) by mouth 2 (two) times daily. 02/15/20   Dwyane Dee, MD  Multiple Vitamins-Minerals (EQ MULTIVITAMINS ADULT GUMMY PO) Take 2 tablets by mouth daily.    [provider]  ondansetron (ZOFRAN-ODT) 4 MG disintegrating tablet Take 4 mg by mouth every 8 (eight) hours as needed for nausea/vomiting. 11/04/20   [provider]  oxyCODONE-acetaminophen (PERCOCET) 5-325 MG tablet Take 1 tablet by mouth every 6 (six) hours as needed. 09/24/20   Rhyne, Hulen Shouts, PA-C  pantoprazole (PROTONIX) 40 MG tablet Take 1 tablet (40 mg total) by mouth 2 (two) times daily. 08/10/20 11/08/20  Barb Merino, MD  promethazine (PHENERGAN) 25 MG tablet Take 25 mg by mouth every 6 (six) hours as needed for nausea or vomiting.  10/13/19   [provider]    Allergies    Diphenhydramine, Doxycycline, Motrin [ibuprofen], Contrast media [iodinated diagnostic agents], Shellfish allergy, Banana, Chlorhexidine, Ferrous sulfate, and Iron dextran  Review of Systems   Review of Systems  Gastrointestinal:  Positive for abdominal pain, nausea and vomiting. Negative for blood in stool and diarrhea.   Physical Exam Updated Vital Signs BP (!) 160/137 (BP Location: Right Arm)    Pulse (!) 130   Temp 97.9 F (36.6 C)   Resp 17   Ht '5\' 6"'$  (1.676 m)   Wt 70.8 kg   SpO2 100%   BMI 25.18 kg/m   Physical Exam Constitutional:      Appearance: She is ill-appearing.  HENT:     Head: Normocephalic and atraumatic.  Cardiovascular:     Rate  and Rhythm: Regular rhythm. Tachycardia present.  Pulmonary:     Effort: Pulmonary effort is normal.     Breath sounds: Normal breath sounds.  Abdominal:     General: Bowel sounds are normal. There is distension.     Palpations: Abdomen is soft.     Tenderness: There is abdominal tenderness. There is no guarding.  Neurological:     Mental Status: She is alert.     Gait: Gait is intact.  Psychiatric:        Mood and Affect: Affect is tearful.    ED Results / Procedures / Treatments   Labs (all labs ordered are listed, but only abnormal results are displayed) Labs Reviewed  CBC WITH DIFFERENTIAL/PLATELET - Abnormal; Notable for the following components:      Result Value   WBC 14.3 (*)    HCT 47.1 (*)    RDW 19.5 (*)    Neutro Abs 12.0 (*)    All other components within normal limits  I-STAT CHEM 8, ED - Abnormal; Notable for the following components:   Sodium 134 (*)    Potassium 5.8 (*)    Chloride 95 (*)    BUN 67 (*)    Creatinine, Ser 11.50 (*)    Glucose, Bld 657 (*)    Calcium, Ion 0.93 (*)    Hemoglobin 17.3 (*)    HCT 51.0 (*)    All other components within normal limits  COMPREHENSIVE METABOLIC PANEL  LIPASE, BLOOD  PROTIME-INR  I-STAT BETA HCG BLOOD, ED ([facility], [facility], [facility] ONLY)  I-STAT VENOUS BLOOD GAS, ED  TYPE AND SCREEN    EKG EKG Interpretation  Date/Time:  Wednesday November 05 2020 06:30:49 EDT Ventricular Rate:  128 PR Interval:  122 QRS Duration: 66 QT Interval:  334 QTC Calculation: 487 R Axis:   100 Text Interpretation: Sinus tachycardia Rightward axis Borderline ECG Since last tracing rate faster Confirmed by Isla Pence [phone]) on 11/05/2020 7:31:06 AM  Radiology No results  found.  Procedures Procedures   Medications Ordered in ED Medications  sodium chloride 0.9 % bolus 1,000 mL (has no administration in time range)    And  0.9 %  sodium chloride infusion (has no administration in time range)  pantoprazole (PROTONIX) 80 mg /NS 100 mL IVPB (has no administration in time range)  pantoprozole (PROTONIX) 80 mg /NS 100 mL infusion (has no administration in time range)  ondansetron (ZOFRAN) injection 4 mg (has no administration in time range)    ED Course  I have reviewed the triage vital signs and the nursing notes.  Pertinent labs & imaging results that were available during my care of the patient were reviewed by me and considered in my medical decision making (see chart for details).    MDM Rules/Calculators/A&P                           Patient's history of normal vomiting before seeing blood suggestive of another episode of esophagitis. No varices noted on prior presentation. Patient hemodynamically stable, vitals notable for elevated HR in the 120s likely in part due to pain. Patient alert, answering questions appropriately, and able to ambulate. Patient started on IV protonix, fluid bolus, and CXR. IV zofran given. Sugar elevated in the 600s, 8 units novolog given. GI consult placed, COVID test ordered as patient will need admission.   Final Clinical Impression(s) / ED Diagnoses Final diagnoses:  GI bleed    Rx /  DC Orders ED Discharge Orders     None        Scarlett Presto, MD 11/05/20 [id number]    Isla Pence, MD 11/05/20 1007

## 2020-11-05 NOTE — ED Triage Notes (Signed)
Pt c/o vomiting blood and abdominal pain. Pt states she is supposed to have dialysis today but they told her to come here.

## 2020-11-05 NOTE — Consult Note (Addendum)
Referring Provider:  [facility]         Primary Care Physician:  [name], MD Primary Gastroenterologist:  Silvano Rusk, MD  Reason for Consultation:  vomiting blood                 ASSESSMENT / PLAN   # 35 yo female with DM, gastroparesis. She has chronic, intermittent nausea, vomiting and upper abdominal pain.  Presenting to ED with same symptoms in addition to hematemesis in setting of known severe esophagitis ( upper endoscopy late May 2022). Suspect hematemesis is secondary to persistent esophagitis. She has dark blood on socks, vomiting blood earlier in ED. --She is hemoconcentrated with hgb of 19 / crit 56%. Receiving IV fluids --She may need repeat EGD to reassess esophagitis but after reviewing labs this needs to wait until she is medical stable. Glucose is 650, K 5.8. She is due for dialysis today. Her Cr is 11.5. --Treatment for hyperglycemia is in progress --Use of anti-emetics complicated by prolonged QT interval  --Received 80 mg IV bolus of Protonix, getting 8 mg / hr which isn't unreasonable given active bleeding --Monitor CBC   HISTORY OF PRESENT ILLNESS                                                                                                                         Chief Complaint: abdominal pain, vomiting blood  [name] is a 35 y.o. female with a past medical history significant for diabetes, diabetic gastroparesis, ESRD on HD M/W/F, history of failed renal transplant, hyperlipidemia, hypertension, triple X syndrome  Patient has chronic nausea and vomiting with history of gastroparesis. She has known severe esophagitis, likely from chronic nausea and vomiting..  We saw her in the hospital late May for abdominal pain, nausea and vomiting.  Upper endoscopy showed severe distal esophagitis.  CT scan that admission was unremarkable except for esophageal wall thickening. Prior to that in 2019 she had an EGD for hematemesis with findings of distal  esophagitis and retained food in the stomach  Patient was supposed to dialyze today.  She has come to the emergency department with complaints of severe mid upper abdominal pain and vomiting blood.  She has chronic upper abdominal pain and chronic intermittent vomiting which started again on Monday ( 2 days ago).  Yesterday she vomited dark blood x3. No blood in stools / black stools. Patient crying, in distress. Constantly asking for pain medication, morphine is not helping.  Dialysis sessions exacerbate the pain. Mild and cold water help the pain. She takes opioids at home to help with the abdominal pain.  She reports taking Protonix and Pepcid differently than that which is listed on home medication list.  She is taking Protonix only once a day in the AM.  She is taking Pepcid only as needed for heartburn which is about twice a week .  Reglan is not on home med list but she says that she takes it once  a day. She denies NSAID use.  In the ED patient has been tachycardic, hypertensive.  Potassium 5.8, glucose 657, BUN 67, creatinine 11.5, ionized calcium 0.96, lipase 61, liver enzymes normal.  Normal alk phosphatase and normal bilirubin, white count 14, hemoglobin 19, hematocrit 56.    SIGNIFICANT DIAGNOTIC STUDIES   08/09/20 Noncon CTAP for abdominal pain  IMPRESSION: 1. Circumferential wall thickening of the distal esophagus. Findings raise suspicion for esophagitis. 2. No other evidence of an acute abnormality within the abdomen or pelvis. 3. Vague areas of relative hypoattenuation in the liver, in retrospect similar to the prior CT. Consider follow-up liver MRI without and with contrast for further assessment. 4. Well-positioned dialysis catheter inserted through the right femoral vein.  PREVIOUS ENDOSCOPIC EVALUATIONS    April 2019 EGD for hematemesis Small hiatal hernia. - [Grade] moderate distal to mid reflux esophagitis. - A large amount of food (residue) in the stomach. - Normal  duodenal bulb, first portion of the duodenum and second portion of the duodenum. - The examination was otherwise normal.  May 2022 EGD for nausea / vomiting / abdominal pain / hematemesis LA Grade D reflux esophagitis with no bleeding. - Erythematous mucosa in the gastric fundus. - Normal examined duodenum. - No specimens collected.    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   LEARNING DISABILITY 09/25/2007   Qualifier: Diagnosis of  By: Deborra Medina MD, Talia     Pseudoseizures ([place]) 12/22/2012   Pyelonephritis 06/23/2014   Seasonal allergies    UTI (urinary tract infection) 01/09/2015   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   Qualifier: Diagnosis of  By: Carlena Sax  MD, [place]      Past Surgical History:  Procedure Laterality Date   ARTERIOVENOUS GRAFT PLACEMENT Bilateral    "neither work" (10/24/2017)   AV FISTULA PLACEMENT Left 10/26/2018   Procedure: CREATION OF ARTERIOVENOUS FISTULA  LEFT ARM;  Surgeon: [name], MD;  Location: [facility];  Service: Vascular;  Laterality: Left;   AV FISTULA PLACEMENT Left 05/23/2020   Procedure: LEFT ARM ARTERIOVENOUS (AV) FISTULA CREATION;  Surgeon: [name], MD;  Location: [place];  Service: Vascular;  Laterality: Left;   [place] Left 12/21/2018   Procedure: Left arm BASILIC VEIN TRANSPOSITION SECOND STAGE;  Surgeon: [name], MD;  Location: [place];  Service: Vascular;  Laterality: Left;   CHOLECYSTECTOMY N/A 06/30/2017   Procedure: LAPAROSCOPIC CHOLECYSTECTOMY WITH INTRAOPERATIVE CHOLANGIOGRAM;  Surgeon: Excell Seltzer, MD;  Location: [facility];  Service: General;  Laterality: N/A;   ESOPHAGOGASTRODUODENOSCOPY (EGD) WITH PROPOFOL N/A 07/04/2017   Procedure:  ESOPHAGOGASTRODUODENOSCOPY (EGD) WITH PROPOFOL;  Surgeon: Clarene Essex, MD;  Location: [facility];  Service: Endoscopy;  Laterality: N/A;   ESOPHAGOGASTRODUODENOSCOPY (EGD) WITH PROPOFOL N/A 08/10/2020   Procedure: ESOPHAGOGASTRODUODENOSCOPY (EGD) WITH PROPOFOL;  Surgeon: Doran Stabler, MD;  Location: [place];  Service: Gastroenterology;  Laterality: N/A;   INSERTION OF DIALYSIS CATHETER N/A 03/20/2018   Procedure: INSERTION OF TUNNELED DIALYSIS CATHETER - RIGHT INTERANL JUGULAR PLACEMENT;  Surgeon: Angelia Mould, MD;  Location: [place];  Service: Vascular;  Laterality: N/A;   IR FLUORO GUIDE CV LINE RIGHT  04/18/2020   IR GUIDED Niel Hummer W  CATHETER PLACEMENT  10/28/2018   KIDNEY TRANSPLANT  2007   PARATHYROIDECTOMY  ?2012   "3/4 removed" (10/24/2017)   RENAL BIOPSY Bilateral 2003   REVISON OF ARTERIOVENOUS FISTULA Left 09/24/2020   Procedure: LEFT UPPER ARM ARTERIOVENOUS GRAFT CREATION;  Surgeon: [name], MD;  Location: [place];  Service: Vascular;  Laterality: Left;   UPPER EXTREMITY VENOGRAPHY Bilateral 10/19/2018   Procedure: UPPER EXTREMITY VENOGRAPHY;  Surgeon: [name], MD;  Location: [facility];  Service: Cardiovascular;  Laterality: Bilateral;  Bilateral    UPPER EXTREMITY VENOGRAPHY Left 09/04/2020   Procedure: UPPER EXTREMITY VENOGRAPHY - Left Upper;  Surgeon: [name], MD;  Location: [place] [facility];  Service: Cardiovascular;  Laterality: Left;    Prior to Admission medications   Medication Sig Start Date End Date Taking? Authorizing Provider  gabapentin (NEURONTIN) 300 MG capsule Take 300 mg by mouth 3 (three) times daily.   Yes [provider]  ACCU-CHEK SOFTCLIX LANCETS lancets Use to check blood sugar 4 times per day. 12/29/15   Renato Shin, MD  acetaminophen (TYLENOL) 500 MG tablet Take 1,000 mg by mouth 2 (two) times daily.    [provider]  calcium acetate (PHOSLO) 667 MG capsule Take 1,334 mg by mouth  2 (two) times daily. 08/18/20   [provider]  Calcium Carbonate Antacid (CALCIUM CARBONATE, DOSED IN MG ELEMENTAL CALCIUM,) 1250 MG/5ML SUSP Take 500 mg of elemental calcium by mouth daily. 5 ml 08/23/20   [provider]  cetirizine (ZYRTEC) 10 MG tablet Take 10 mg by mouth daily as needed for allergies.    [provider]  famotidine (PEPCID) 40 MG tablet Take 40 mg by mouth at bedtime.    [provider]  fluticasone (FLONASE) 50 MCG/ACT nasal spray Place 2 sprays into both nostrils daily as needed for allergies. 09/03/18   [name], MD  hydrOXYzine (ATARAX/VISTARIL) 50 MG tablet Take 1 tablet (50 mg total) by mouth 2 (two) times daily as needed for itching. 02/06/18   [name], Ripudeep K, MD  insulin aspart (NOVOLOG) 100 UNIT/ML FlexPen Inject 2-10 Units into the skin 3 (three) times daily with meals. Sliding scale    [provider]  insulin degludec (TRESIBA) 100 UNIT/ML FlexTouch Pen Inject 20 Units into the skin daily. 02/15/20   Dwyane Dee, MD  Insulin Pen Needle (BD PEN NEEDLE NANO 2ND GEN) 32G X 4 MM MISC 1 each by Does not apply route in the morning, at noon, in the evening, and at bedtime. 08/10/20   Barb Merino, MD  loperamide (IMODIUM) 1 MG/5ML solution Take 2 mg by mouth 2 (two) times daily as needed for diarrhea or loose stools.     [provider]  metoprolol tartrate (LOPRESSOR) 25 MG tablet Take 1 tablet (25 mg total) by mouth 2 (two) times daily. 02/15/20   Dwyane Dee, MD  Multiple Vitamins-Minerals (EQ MULTIVITAMINS ADULT GUMMY PO) Take 2 tablets by mouth daily.    [provider]  ondansetron (ZOFRAN-ODT) 4 MG disintegrating tablet Take 4 mg by mouth every 8 (eight) hours as needed for nausea/vomiting. 11/04/20   [provider]  oxyCODONE-acetaminophen (PERCOCET) 5-325 MG tablet Take 1 tablet by mouth every 6 (six) hours as needed. 09/24/20   Rhyne, Hulen Shouts, PA-C  pantoprazole (PROTONIX) 40 MG  tablet Take 1 tablet (40 mg total) by mouth 2 (two) times daily. 08/10/20 11/08/20  Barb Merino, MD  promethazine (PHENERGAN) 25 MG tablet Take 25 mg  by mouth every 6 (six) hours as needed for nausea or vomiting.  10/13/19   [provider]    Current Facility-Administered Medications  Medication Dose Route Frequency Provider Last Rate Last Admin   0.9 %  sodium chloride infusion   Intravenous Continuous Demaio, Alexa, MD 125 mL/hr at 11/05/20 0855 New Bag at 11/05/20 0855   pantoprozole (PROTONIX) 80 mg /NS 100 mL infusion  8 mg/hr Intravenous Continuous Demaio, Alexa, MD 10 mL/hr at 11/05/20 0903 8 mg/hr at 11/05/20 5329   Current Outpatient Medications  Medication Sig Dispense Refill   gabapentin (NEURONTIN) 300 MG capsule Take 300 mg by mouth 3 (three) times daily.     ACCU-CHEK SOFTCLIX LANCETS lancets Use to check blood sugar 4 times per day. 150 each 5   acetaminophen (TYLENOL) 500 MG tablet Take 1,000 mg by mouth 2 (two) times daily.     calcium acetate (PHOSLO) 667 MG capsule Take 1,334 mg by mouth 2 (two) times daily.     Calcium Carbonate Antacid (CALCIUM CARBONATE, DOSED IN MG ELEMENTAL CALCIUM,) 1250 MG/5ML SUSP Take 500 mg of elemental calcium by mouth daily. 5 ml     cetirizine (ZYRTEC) 10 MG tablet Take 10 mg by mouth daily as needed for allergies.     famotidine (PEPCID) 40 MG tablet Take 40 mg by mouth at bedtime.     fluticasone (FLONASE) 50 MCG/ACT nasal spray Place 2 sprays into both nostrils daily as needed for allergies.     hydrOXYzine (ATARAX/VISTARIL) 50 MG tablet Take 1 tablet (50 mg total) by mouth 2 (two) times daily as needed for itching. 30 tablet 0   insulin aspart (NOVOLOG) 100 UNIT/ML FlexPen Inject 2-10 Units into the skin 3 (three) times daily with meals. Sliding scale     insulin degludec (TRESIBA) 100 UNIT/ML FlexTouch Pen Inject 20 Units into the skin daily.     Insulin Pen Needle (BD PEN NEEDLE NANO 2ND GEN) 32G X 4 MM MISC 1 each by Does not  apply route in the morning, at noon, in the evening, and at bedtime. 100 each 2   loperamide (IMODIUM) 1 MG/5ML solution Take 2 mg by mouth 2 (two) times daily as needed for diarrhea or loose stools.      metoprolol tartrate (LOPRESSOR) 25 MG tablet Take 1 tablet (25 mg total) by mouth 2 (two) times daily. 60 tablet 3   Multiple Vitamins-Minerals (EQ MULTIVITAMINS ADULT GUMMY PO) Take 2 tablets by mouth daily.     ondansetron (ZOFRAN-ODT) 4 MG disintegrating tablet Take 4 mg by mouth every 8 (eight) hours as needed for nausea/vomiting.     oxyCODONE-acetaminophen (PERCOCET) 5-325 MG tablet Take 1 tablet by mouth every 6 (six) hours as needed. 20 tablet 0   pantoprazole (PROTONIX) 40 MG tablet Take 1 tablet (40 mg total) by mouth 2 (two) times daily. 60 tablet 2   promethazine (PHENERGAN) 25 MG tablet Take 25 mg by mouth every 6 (six) hours as needed for nausea or vomiting.       Allergies as of 11/05/2020 - Review Complete 11/05/2020  Allergen Reaction Noted   Diphenhydramine Anaphylaxis 12/27/2019   Doxycycline Shortness Of Breath 11/20/2015   Motrin [ibuprofen] Shortness Of Breath and Itching 09/17/2010   Contrast media [iodinated diagnostic agents] Itching 09/04/2020   Shellfish allergy Hives 12/22/2012   Banana Itching, Nausea And Vomiting, and Other (See Comments) 12/22/2012   Chlorhexidine Itching 06/15/2019   Ferrous sulfate Itching 07/19/2018   Iron dextran Itching and Other (  See Comments) 02/24/2010    Family History  Problem Relation Age of Onset   Arthritis Mother    Hypertension Mother    Aneurysm Mother        died of brain aneurysm   CAD Father        Has 3 stents   Diabetes Father        borderline   Early death Brother        Died in war    Social History   Socioeconomic History   Marital status: Single    Spouse name: Not on file   Number of children: Not on file   Years of education: Not on file   Highest education level: Not on file  Occupational  History   Occupation: Scientist, 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    Employer: [employer]  Tobacco Use   Smoking status: Never   Smokeless tobacco: Never  Vaping Use   Vaping Use: Never used  Substance and Sexual Activity   Alcohol use: No   Drug use: No   Sexual activity: Yes    Birth control/protection: None  Other Topics Concern   Not on file  Social History Narrative   Patient reports that she is single,    Right Handed   Drinks caffeine rarely   Social Determinants of Health   Financial Resource Strain: Not on file  Food Insecurity: Not on file  Transportation Needs: Not on file  Physical Activity: Not on file  Stress: Not on file  Social Connections: Not on file  Intimate Partner Violence: Not on file    Review of Systems: All systems reviewed and negative except where noted in HPI.     OBJECTIVE    Physical Exam: Vital signs in last 24 hours: Temp:  [97.9 F (36.6 C)] 97.9 F (36.6 C) (08/24 0613) Pulse Rate:  [117-130] 117 (08/24 0800) Resp:  [16-17] 16 (08/24 0800) BP: (158-160)/(117-137) 158/117 (08/24 0800) SpO2:  [100 %] 100 % (08/24 0800) Weight:  [70.8 kg] 70.8 kg (08/24 2353)   General:   Alert  female crying with abdominal pain Psych:  Pleasant, cooperative. . Eyes:  Pupils equal, sclera clear, no icterus.   Conjunctiva pink. Ears:  Normal auditory acuity. Nose:  No deformity, discharge,  or lesions. Neck:  Supple; no masses Lungs:  Clear throughout to auscultation.   No wheezes, crackles, or rhonchi.  Heart:  Regular rate and rhythm;  no lower extremity edema Abdomen:  Soft, non-distended, diffusely tender to very light palpation. BS active, no palp mass   Rectal:  Deferred  Msk:  Symmetrical without gross deformities. . Neurologic:  Alert and  oriented x4;  grossly normal neurologically. Skin:  Intact without significant lesions or rashes.   Scheduled inpatient medications  Intake/Output from previous day: No intake/output data  recorded. Intake/Output this shift: Total I/O In: 100 [IV Piggyback:100] Out: -    Lab Results: Recent Labs    11/05/20 0710 11/05/20 0714 11/05/20 0754  WBC 14.3*  --   --   HGB 14.9 17.3* 19.0*  HCT 47.1* 51.0* 56.0*  PLT 172  --   --    BMET Recent Labs    11/05/20 0710 11/05/20 0714 11/05/20 0754  NA 133* 134* 134*  K 5.8* 5.8* 5.8*  CL 87* 95*  --   CO2 24  --   --   GLUCOSE 665* 657*  --   BUN 66* 67*  --   CREATININE 11.12*  11.50*  --   CALCIUM 9.8  --   --    LFT Recent Labs    11/05/20 0710  PROT 9.7*  ALBUMIN 4.2  AST 24  ALT 13  ALKPHOS 91  BILITOT 0.7   PT/INR No results for input(s): LABPROT, INR in the last 72 hours. Hepatitis Panel No results for input(s): HEPBSAG, HCVAB, HEPAIGM, HEPBIGM in the last 72 hours.   . CBC Latest Ref Rng & Units 11/05/2020 11/05/2020 11/05/2020  WBC 4.0 - 10.5 K/uL - - 14.3(H)  Hemoglobin 12.0 - 15.0 g/dL 19.0(H) 17.3(H) 14.9  Hematocrit 36.0 - 46.0 % 56.0(H) 51.0(H) 47.1(H)  Platelets 150 - 400 K/uL - - 172    . CMP Latest Ref Rng & Units 11/05/2020 11/05/2020 11/05/2020  Glucose 70 - 99 mg/dL - 657(HH) 665(HH)  BUN 6 - 20 mg/dL - 67(H) 66(H)  Creatinine 0.44 - 1.00 mg/dL - 11.50(H) 11.12(H)  Sodium 135 - 145 mmol/L 134(L) 134(L) 133(L)  Potassium 3.5 - 5.1 mmol/L 5.8(H) 5.8(H) 5.8(H)  Chloride 98 - 111 mmol/L - 95(L) 87(L)  CO2 22 - 32 mmol/L - - 24  Calcium 8.9 - 10.3 mg/dL - - 9.8  Total Protein 6.5 - 8.1 g/dL - - 9.7(H)  Total Bilirubin 0.3 - 1.2 mg/dL - - 0.7  Alkaline Phos 38 - 126 U/L - - 91  AST 15 - 41 U/L - - 24  ALT 0 - 44 U/L - - 13   Studies/Results: DG Chest Port 1 View  Result Date: 11/05/2020 CLINICAL DATA:  Vomiting blood.  Abdominal pain. EXAM: PORTABLE CHEST 1 VIEW COMPARISON:  None. FINDINGS: Support lines: Cephalad-projecting dialysis catheter, with tip within the RIGHT atrium. Enlarged cardiac silhouette, greater than expected in a patient of this age. low lung volumes. No focal  consolidation or mass. No pleural effusion or pneumothorax. Cholecystectomy clips. No acute displaced fracture. IMPRESSION: 1. Femoral tunneled dialysis catheter, with tip within the RIGHT atrium. 2. Cardiomegaly without acute superimposed cardiopulmonary process. Electronically Signed   By: Michaelle Birks M.D.   On: 11/05/2020 08:16    Active Problems:   Hematemesis    Tye Savoy, NP-C @  11/05/2020, 9:26 AM

## 2020-11-05 NOTE — ED Provider Notes (Signed)
Emergency Medicine Provider Triage Evaluation Note  [name] , a 35 y.o. female  was evaluated in triage.  Pt complains of hematemesis.  The patient reports multiple episodes of hematemesis since yesterday.  She reports associated epigastric abdominal pain.  She denies melena, hematochezia, shortness of breath, syncope, dizziness, lightheadedness.  She went to dialysis this morning and they advised her to come to the emergency department for further work-up and evaluation.  She has a history of an upper GI bleed.  Last dialysis was Monday when she received a full session.  Does report that she is having some sharp central chest pain.  Review of Systems  Positive: Hematemesis, epigastric abdominal pain, chest Negative: Melena, hematochezia, shortness of breath, syncope, dizziness, lightheadedness  Physical Exam  BP (!) 160/137 (BP Location: Right Arm)   Pulse (!) 130   Temp 97.9 F (36.6 C)   Resp 17   Ht '5\' 6"'$  (1.676 m)   Wt 70.8 kg   SpO2 100%   BMI 25.18 kg/m  Gen:   Awake, moaning and holding her abdomen in triage Resp:  Normal effort  MSK:   Moves extremities without difficulty  Other:  Emesis bag full of bloody emesis noted at bedside.  Tender to palpation in the epigastric region.  Tachycardic.  Medical Decision Making  Medically screening exam initiated at 6:35 AM.  Appropriate orders placed.  [name] was informed that the remainder of the evaluation will be completed by another provider, this initial triage assessment does not replace that evaluation, and the importance of remaining in the ED until their evaluation is complete.  Labs and imaging have been ordered in triage. EKG with sinus tachycardia.  QTC is 487.  Discussed with charge RN that patient will need a room soon for her symptoms in the ED. she will require further work-up and evaluation in the ED.   Joanne Gavel, PA-C 11/05/20 [id number]    Merryl Hacker, MD 11/10/20 [phone]

## 2020-11-05 NOTE — ED Notes (Signed)
Multiple attempts to start PIV by multiple nurses, unsuccessful. MD notified. IV team now at the bedside.

## 2020-11-05 NOTE — ED Notes (Signed)
RN aware of pts O2 levels

## 2020-11-05 NOTE — ED Notes (Signed)
Unable to do Orthostatic at this time, patient in a lot of pain.

## 2020-11-06 ENCOUNTER — Ambulatory Visit: Payer: Medicare Other

## 2020-11-06 ENCOUNTER — Encounter (HOSPITAL_COMMUNITY): Payer: Self-pay | Admitting: Internal Medicine

## 2020-11-06 ENCOUNTER — Other Ambulatory Visit (HOSPITAL_COMMUNITY): Payer: Self-pay

## 2020-11-06 ENCOUNTER — Encounter (HOSPITAL_COMMUNITY): Payer: Self-pay | Admitting: *Deleted

## 2020-11-06 DIAGNOSIS — K920 Hematemesis: Secondary | ICD-10-CM | POA: Diagnosis not present

## 2020-11-06 DIAGNOSIS — K922 Gastrointestinal hemorrhage, unspecified: Secondary | ICD-10-CM

## 2020-11-06 LAB — GLUCOSE, CAPILLARY
Glucose-Capillary: 126 mg/dL — ABNORMAL HIGH (ref 70–99)
Glucose-Capillary: 195 mg/dL — ABNORMAL HIGH (ref 70–99)
Glucose-Capillary: 196 mg/dL — ABNORMAL HIGH (ref 70–99)

## 2020-11-06 LAB — RENAL FUNCTION PANEL
Albumin: 3.2 g/dL — ABNORMAL LOW (ref 3.5–5.0)
Anion gap: 20 — ABNORMAL HIGH (ref 5–15)
BUN: 80 mg/dL — ABNORMAL HIGH (ref 6–20)
CO2: 20 mmol/L — ABNORMAL LOW (ref 22–32)
Calcium: 8.2 mg/dL — ABNORMAL LOW (ref 8.9–10.3)
Chloride: 98 mmol/L (ref 98–111)
Creatinine, Ser: 11.23 mg/dL — ABNORMAL HIGH (ref 0.44–1.00)
GFR, Estimated: 4 mL/min — ABNORMAL LOW (ref >60)
Glucose, Bld: 189 mg/dL — ABNORMAL HIGH (ref 70–99)
Phosphorus: 7.2 mg/dL — ABNORMAL HIGH (ref 2.5–4.6)
Potassium: 5 mmol/L (ref 3.5–5.1)
Sodium: 138 mmol/L (ref 135–145)

## 2020-11-06 LAB — CBC
HCT: 39.6 % (ref 36.0–46.0)
Hemoglobin: 12.2 g/dL (ref 12.0–15.0)
MCH: 30.2 pg (ref 26.0–34.0)
MCHC: 30.8 g/dL (ref 30.0–36.0)
MCV: 98 fL (ref 80.0–100.0)
Platelets: 130 10*3/uL — ABNORMAL LOW (ref 150–400)
RBC: 4.04 MIL/uL (ref 3.87–5.11)
RDW: 19.7 % — ABNORMAL HIGH (ref 11.5–15.5)
WBC: 14.2 10*3/uL — ABNORMAL HIGH (ref 4.0–10.5)
nRBC: 0 % (ref 0.0–0.2)

## 2020-11-06 MED ORDER — PANTOPRAZOLE SODIUM 40 MG PO TBEC: 40.0000 mg | DELAYED_RELEASE_TABLET | Freq: Two times a day (BID) | ORAL | Status: DC

## 2020-11-06 MED ORDER — CALCIUM CARBONATE ANTACID 500 MG PO CHEW
1.0000 | CHEWABLE_TABLET | Freq: Three times a day (TID) | ORAL | Status: DC | PRN
  Administered 2020-11-06: 200 mg via ORAL
  Filled 2020-11-06: qty 1

## 2020-11-06 MED ORDER — HEPARIN SODIUM (PORCINE) 1000 UNIT/ML IJ SOLN
INTRAMUSCULAR | Status: AC
  Filled 2020-11-06: qty 1

## 2020-11-06 MED ORDER — INSULIN ASPART 100 UNIT/ML FLEXPEN
2.0000 [IU] | PEN_INJECTOR | Freq: Three times a day (TID) | SUBCUTANEOUS | 0 refills | Status: DC
  Filled 2020-11-06: qty 15, 50d supply, fill #0

## 2020-11-06 MED ORDER — INSULIN PEN NEEDLE 32G X 4 MM MISC
0 refills | Status: DC
  Filled 2020-11-06: qty 100, 30d supply, fill #0

## 2020-11-06 NOTE — Progress Notes (Signed)
[facility] Progress Note  Subjective: seen on HD, c/o reflux pain  Vitals:   11/06/20 0800 11/06/20 0836 11/06/20 0853 11/06/20 0930  BP: 98/72 (!) 131/94 116/66 (!) 70/56  Pulse: 86 89 93 100  Resp: '14 14 17 16  '$ Temp: 98.6 F (37 C) 97.7 F (36.5 C)    TempSrc: Oral Oral    SpO2: 97% 96% 100% 98%  Weight:  80.9 kg    Height:        Exam:  alert, nad   no jvd  Chest cta bilat  Cor reg no RG  Abd soft ntnd no ascites   Ext no LE edema   Alert, NF, ox3  LUA AVF maturing/ R fem TDC     Home meds include phoslo 2 ac, pepcid, neurontin 300 tid, novolog insulin and degludec, lopressor, percocet prn, protonix, prns      OP HD:  [facility]    4h  80.5kg  P4  fem TDC/ maturing LUA AVG 09/24/20  Hep 4000  - hect 2 ug  - venofer 50 qwk  - mircera 200 last 8/15       Assessment/ Plan: GIB/ hematemesis - per GI, Hb 14 ESRD - on HD MWF. HD off schedule today. Due to staffing issues we are doing stable pts only 2 times this week. HD Monday/ Thursday >  next HD Monday.    Hyperkalemia - mild, improved 5.0 today. Renal diet while here. HD today.  DM2 on insulin HTN /volume -  on lopressor only at home, not tolerating any UF on HD, will keep even. At dry wt.  Anemia ckd - Hb 14, not anemic      Rob Crytal Pensinger 11/06/2020, 10:00 AM   Recent Labs  Lab 11/05/20 0710 11/05/20 0714 11/05/20 0754 11/05/20 2145 11/06/20 0121  K 5.8* 5.8* 5.8*  --  5.0  BUN 66* 67*  --   --  80*  CREATININE 11.12* 11.50*  --   --  11.23*  CALCIUM 9.8  --   --   --  8.2*  PHOS  --   --   --   --  7.2*  HGB 14.9 17.3* 19.0* 13.3 12.2   Inpatient medications:  calcium acetate  1,334 mg Oral BID WC   Chlorhexidine Gluconate Cloth  6 each Topical Q0600   gabapentin  300 mg Oral TID   insulin aspart  0-9 Units Subcutaneous TID WC   insulin glargine-yfgn  20 Units Subcutaneous Daily   metoprolol tartrate  25 mg Oral BID   sodium chloride flush  3 mL Intravenous Q12H     pantoprazole 8 mg/hr (11/05/20 2103)   acetaminophen **OR** acetaminophen, albuterol, calcium carbonate, fluticasone, hydrOXYzine, loratadine, naLOXone (NARCAN)  injection, oxyCODONE-acetaminophen

## 2020-11-06 NOTE — Progress Notes (Signed)
Daily Rounding Note  11/06/2020, 12:29 PM  LOS: 0 days   SUBJECTIVE:   Chief complaint:  chronic abd pain, n/v.  Recurrent hematemesis.  Gastroparesis.     Denies abd pain, nausea, vomiting.  Brown stool last PM.  Hungry, wants solid food,   OBJECTIVE:         Vital signs in last 24 hours:    Temp:  [97.7 F (36.5 C)-98.8 F (37.1 C)] 97.7 F (36.5 C) (08/25 0836) Pulse Rate:  [86-100] 92 (08/25 1130) Resp:  [13-22] 15 (08/25 1130) BP: (70-131)/(56-102) 85/60 (08/25 1130) SpO2:  [80 %-100 %] 100 % (08/25 1130) Weight:  [71.3 kg-80.9 kg] 80.9 kg (08/25 0836) Last BM Date: 11/05/20 Filed Weights   11/05/20 0611 11/06/20 0400 11/06/20 0836  Weight: 70.8 kg 71.3 kg 80.9 kg   General: looks well   Heart: RRR Chest: clear bil.  No dyspnea Abdomen: NT, ND.  Soft, active BS  Extremities: no CCE Neuro/Psych:  oriented x 3.  No tremors or gross weakness.    Intake/Output from previous day: 08/24 0701 - 08/25 0700 In: 1793 [P.O.:480; I.V.:213; IV Piggyback:1100] Out: -   Intake/Output this shift: No intake/output data recorded.  Lab Results: Recent Labs    11/05/20 0710 11/05/20 0714 11/05/20 0754 11/05/20 2145 11/06/20 0121  WBC 14.3*  --   --   --  14.2*  HGB 14.9   < > 19.0* 13.3 12.2  HCT 47.1*   < > 56.0* 43.7 39.6  PLT 172  --   --   --  130*   < > = values in this interval not displayed.   BMET Recent Labs    11/05/20 0710 11/05/20 0714 11/05/20 0754 11/06/20 0121  NA 133* 134* 134* 138  K 5.8* 5.8* 5.8* 5.0  CL 87* 95*  --  98  CO2 24  --   --  20*  GLUCOSE 665* 657*  --  189*  BUN 66* 67*  --  80*  CREATININE 11.12* 11.50*  --  11.23*  CALCIUM 9.8  --   --  8.2*   LFT Recent Labs    11/05/20 0710 11/06/20 0121  PROT 9.7*  --   ALBUMIN 4.2 3.2*  AST 24  --   ALT 13  --   ALKPHOS 91  --   BILITOT 0.7  --    PT/INR Recent Labs    11/05/20 0845  LABPROT 14.8  INR 1.2    Hepatitis Panel No results for input(s): HEPBSAG, HCVAB, HEPAIGM, HEPBIGM in the last 72 hours.  Studies/Results: DG Chest Port 1 View  Result Date: 11/05/2020 CLINICAL DATA:  Vomiting blood.  Abdominal pain. EXAM: PORTABLE CHEST 1 VIEW COMPARISON:  None. FINDINGS: Support lines: Cephalad-projecting dialysis catheter, with tip within the RIGHT atrium. Enlarged cardiac silhouette, greater than expected in a patient of this age. low lung volumes. No focal consolidation or mass. No pleural effusion or pneumothorax. Cholecystectomy clips. No acute displaced fracture. IMPRESSION: 1. Femoral tunneled dialysis catheter, with tip within the RIGHT atrium. 2. Cardiomegaly without acute superimposed cardiopulmonary process. Electronically Signed   By: Michaelle Birks M.D.   On: 11/05/2020 08:16    Scheduled Meds:  calcium acetate  1,334 mg Oral BID WC   Chlorhexidine Gluconate Cloth  6 each Topical Q0600   gabapentin  300 mg Oral TID   insulin aspart  0-9 Units Subcutaneous TID WC   insulin glargine-yfgn  20  Units Subcutaneous Daily   metoprolol tartrate  25 mg Oral BID   sodium chloride flush  3 mL Intravenous Q12H   Continuous Infusions:  pantoprazole 8 mg/hr (11/06/20 1217)   PRN Meds:.acetaminophen **OR** acetaminophen, albuterol, calcium carbonate, fluticasone, hydrOXYzine, loratadine, naLOXone (NARCAN)  injection, oxyCODONE-acetaminophen   ASSESMENT:   Longstanding abdominal pain, nausea, vomiting in patient with diabetic gastroparesis, GERD.  Recurrent hematemesis.  Had same in May 2022 with EGD then showing severe esophagitis.  No plans for repeating EGD.  Needs symptomatic care and strict management of blood sugars.     IDDM.  Presenting glucose 650.     ESRD.  Hemodialysis on MWF.  Failed renal transplant.   PLAN     Carb mod diet.  Switch to PPI by mouth. Stay w HS Pepcid.  GI signing off.  Available prn.  Keep sugars under better control.      Azucena Freed  11/06/2020, 12:29  PM Phone [phone]

## 2020-11-06 NOTE — Plan of Care (Signed)
  Problem: Bowel/Gastric: Goal: Will show no signs and symptoms of gastrointestinal bleeding Outcome: Progressing   Problem: Fluid Volume: Goal: Will show no signs and symptoms of excessive bleeding Outcome: Progressing   Problem: Clinical Measurements: Goal: Complications related to the disease process, condition or treatment will be avoided or minimized Outcome: Progressing   Problem: Coping: Goal: Ability to adjust to condition or change in health will improve Outcome: Progressing   Problem: Fluid Volume: Goal: Ability to maintain a balanced intake and output will improve Outcome: Progressing

## 2020-11-06 NOTE — Care Management Obs Status (Signed)
[address] NOTIFICATION   Patient Details  Name: [name] MRN: [id number] Date of Birth: 09/08/1985   Medicare Observation Status Notification Given:  Yes    [name], RN 11/06/2020, 4:08 PM

## 2020-11-06 NOTE — Discharge Summary (Signed)
Physician Discharge Summary  [name] [id number] DOB: 1985/12/01 DOA: 11/05/2020  PCP: [name], MD  Admit date: 11/05/2020 Discharge date: 11/06/2020  Admitted From: Home Disposition:  Home  Discharge Condition:Stable CODE STATUS:FULL Diet recommendation: renal  Brief/Interim Summary: Patient is a 35 year old female with history of hypertension, diabetes type 1 with gastroparesis, ESRD on dialysis on Monday Wednesday Friday, triple X syndrome, pseudoseizures, GERD, esophagitis who presented with vomiting of blood.  She was also reporting epigastric abdominal pain, nausea, vomiting.  She was hospitalized with similar presentation in May at the time EGD revealed severe esophagitis.  On presentation, she was tachycardic, hypertensive, lab work showed leukocytosis, hemoglobin was stable in the range of 14, potassium was 5.8.  Patient was started on Protonix drip.  GI, nephrology consulted. She underwent dialysis today.  GI consulted but there is no plan for repeat endoscopy at this time.  GI recommending to continue Protonix twice daily and follow as an outpatient.  She is medically stable for discharge home today.  Following problems were addressed during her hospitalization:  Hematemesis: Most likely secondary to recurrent esophagitis.  She was hospitalized in May this year due to same.  Hemoglobin has remained stable.  Last EGD had shown severe esophagitis.  Hematemesis could be secondary to esophagitis versus Mallory-Weiss tear. Started  Protonix drip.  GI consulted but there is no plan for repeat endoscopy at this time.  GI recommending to continue Protonix twice daily and follow as an outpatient.   ESRD on dialysis: Dialysis on Monday, Wednesday, Friday.  Nephrology consulted.  She has a right femoral dialysis catheter and also left upper extremity fistula placed on 7/13. Potassium was noted to be 5.8 on presentation.  Given insulin, IV fluids, potassium has normalized.   Underwent dialysis today.   Leukocytosis/thrombocytopenia: Most likely reactive.  Do a CBC by following with the PCP in a week   Diabetes type 2 with hyperglycemia: Blood sugar severely elevated on presentation.  Anion gap was elevated.  Elevated anion gap was most likely secondary to renal insufficiency greater than DKA.  Last hemoglobin A1c was 11 as per 5/22.  She is on insulin at home.  I have strictly recommended to continue monitoring blood sugars at home.  Continue current insulin regimen on discharge.  Follow-up closely with the PCP   hypertension: Hypertensive on presentation, now blood pressure is soft/stable.  On metoprolol at home.   Chronic pain syndrome: On oxycodone at home.   Elevated beta-hCG: Mild ,in the range of 10.Unclear if she is pregnant.She doesn't have menses    Discharge Diagnoses:  Principal Problem:   Hematemesis Active Problems:   Type 1 diabetes mellitus with complication, uncontrolled (HCC)   Chronic pain   ESRD on hemodialysis (HCC)   Hyperkalemia   GERD with esophagitis    Discharge Instructions  Discharge Instructions     Diet Carb Modified   Complete by: As directed    Discharge instructions   Complete by: As directed    1)Please continue your home medications including Protonix 2)Follow up with your PCP in a week.  Follow-up with your gastroenterologist. 3)Monitor your blood sugars at home.   Increase activity slowly   Complete by: As directed       Allergies as of 11/06/2020       Reactions   Diphenhydramine Anaphylaxis   Doxycycline Shortness Of Breath   Motrin [ibuprofen] Shortness Of Breath, Itching   Contrast Media [iodinated Diagnostic Agents] Itching   Shellfish Allergy Hives  Banana Itching, Nausea And Vomiting, Other (See Comments)   Sick on the stomach   Chlorhexidine Itching   Ferrous Sulfate Itching   Iron Dextran Itching, Other (See Comments)   Vein irritation        Medication List     TAKE these  medications    Accu-Chek Softclix Lancets lancets Use to check blood sugar 4 times per day.   acetaminophen 500 MG tablet Commonly known as: TYLENOL Take 1,000 mg by mouth 2 (two) times daily.   BD Pen Needle Nano 2nd Gen 32G X 4 MM Misc Generic drug: Insulin Pen Needle 1 each by Does not apply route in the morning, at noon, in the evening, and at bedtime.   calcium acetate 667 MG capsule Commonly known as: PHOSLO Take 1,334 mg by mouth 2 (two) times daily.   calcium carbonate (dosed in mg elemental calcium) 1250 MG/5ML Susp Take 500 mg of elemental calcium by mouth daily. 5 ml   cetirizine 10 MG tablet Commonly known as: ZYRTEC Take 10 mg by mouth daily as needed for allergies.   EQ MULTIVITAMINS ADULT GUMMY PO Take 2 tablets by mouth daily.   famotidine 40 MG tablet Commonly known as: PEPCID Take 40 mg by mouth at bedtime.   fluticasone 50 MCG/ACT nasal spray Commonly known as: FLONASE Place 2 sprays into both nostrils daily as needed for allergies.   gabapentin 300 MG capsule Commonly known as: NEURONTIN Take 300 mg by mouth 3 (three) times daily.   hydrOXYzine 50 MG tablet Commonly known as: ATARAX/VISTARIL Take 1 tablet (50 mg total) by mouth 2 (two) times daily as needed for itching.   insulin aspart 100 UNIT/ML FlexPen Commonly known as: NOVOLOG Inject 2-10 Units into the skin 3 (three) times daily with meals. Sliding scale   insulin degludec 100 UNIT/ML FlexTouch Pen Commonly known as: TRESIBA Inject 20 Units into the skin daily.   loperamide 1 MG/5ML solution Commonly known as: IMODIUM Take 2 mg by mouth 2 (two) times daily as needed for diarrhea or loose stools.   metoprolol tartrate 25 MG tablet Commonly known as: LOPRESSOR Take 1 tablet (25 mg total) by mouth 2 (two) times daily.   ondansetron 4 MG disintegrating tablet Commonly known as: ZOFRAN-ODT Take 4 mg by mouth every 8 (eight) hours as needed for nausea/vomiting.    oxyCODONE-acetaminophen 5-325 MG tablet Commonly known as: Percocet Take 1 tablet by mouth every 6 (six) hours as needed. What changed: reasons to take this   pantoprazole 40 MG tablet Commonly known as: PROTONIX Take 1 tablet (40 mg total) by mouth 2 (two) times daily.        Follow-up Information     [name], MD. Schedule an appointment as soon as possible for a visit in 1 week(s).   Specialty: Internal Medicine Contact information: [place] [address] [phone]         Josue Hector, MD .   Specialty: Cardiology Contact information: [phone] [address] [place] [address] [phone]                Allergies  Allergen Reactions   Diphenhydramine Anaphylaxis   Doxycycline Shortness Of Breath   Motrin [Ibuprofen] Shortness Of Breath and Itching   Contrast Media [Iodinated Diagnostic Agents] Itching   Shellfish Allergy Hives   Banana Itching, Nausea And Vomiting and Other (See Comments)    Sick on the stomach   Chlorhexidine Itching   Ferrous Sulfate Itching  Iron Dextran Itching and Other (See Comments)    Vein irritation     Consultations: Nephrology,GI   Procedures/Studies: DG Chest Port 1 View  Result Date: 11/05/2020 CLINICAL DATA:  Vomiting blood.  Abdominal pain. EXAM: PORTABLE CHEST 1 VIEW COMPARISON:  None. FINDINGS: Support lines: Cephalad-projecting dialysis catheter, with tip within the RIGHT atrium. Enlarged cardiac silhouette, greater than expected in a patient of this age. low lung volumes. No focal consolidation or mass. No pleural effusion or pneumothorax. Cholecystectomy clips. No acute displaced fracture. IMPRESSION: 1. Femoral tunneled dialysis catheter, with tip within the RIGHT atrium. 2. Cardiomegaly without acute superimposed cardiopulmonary process. Electronically Signed   By: Michaelle Birks M.D.   On: 11/05/2020 08:16      Subjective: Patient seen and examined at the bedside  this morning.  Hemodynamically stable for discharge today.  Discharge Exam: Vitals:   11/06/20 1200 11/06/20 1230  BP: 103/75 96/63  Pulse: 94 94  Resp: 15 15  Temp:  98.3 F (36.8 C)  SpO2:  100%   Vitals:   11/06/20 1100 11/06/20 1130 11/06/20 1200 11/06/20 1230  BP: (!) 76/58 (!) 85/60 103/75 96/63  Pulse: 93 92 94 94  Resp: '16 15 15 15  '$ Temp:    98.3 F (36.8 C)  TempSrc:    Oral  SpO2: 100% 100%  100%  Weight:      Height:        General: Pt is alert, awake, not in acute distress Cardiovascular: RRR, S1/S2 +, no rubs, no gallops Respiratory: CTA bilaterally, no wheezing, no rhonchi Abdominal: Soft, NT, ND, bowel sounds + Extremities: no edema, no cyanosis, dialysis femoral catheter on the right    The results of significant diagnostics from this hospitalization (including imaging, microbiology, ancillary and laboratory) are listed below for reference.     Microbiology: Recent Results (from the past 240 hour(s))  Resp Panel by RT-PCR (Flu A&B, Covid) Nasopharyngeal Swab     Status: None   Collection Time: 11/05/20  9:05 AM   Specimen: Nasopharyngeal Swab; Nasopharyngeal(NP) swabs in vial transport medium  Result Value Ref Range Status   SARS Coronavirus 2 by RT PCR NEGATIVE NEGATIVE Final    Comment: (NOTE) SARS-CoV-2 target nucleic acids are NOT DETECTED.  The SARS-CoV-2 RNA is generally detectable in upper respiratory specimens during the acute phase of infection. The lowest concentration of SARS-CoV-2 viral copies this assay can detect is 138 copies/mL. A negative result does not preclude SARS-Cov-2 infection and should not be used as the sole basis for treatment or other patient management decisions. A negative result may occur with  improper specimen collection/handling, submission of specimen other than nasopharyngeal swab, presence of viral mutation(s) within the areas targeted by this assay, and inadequate number of viral copies(<138 copies/mL). A  negative result must be combined with clinical observations, patient history, and epidemiological information. The expected result is Negative.  Fact Sheet for Patients:  EntrepreneurPulse.com.au  Fact Sheet for Healthcare Providers:  IncredibleEmployment.be  This test is no t yet approved or cleared by the [place] FDA and  has been authorized for detection and/or diagnosis of SARS-CoV-2 by FDA under an Emergency Use Authorization (EUA). This EUA will remain  in effect (meaning this test can be used) for the duration of the COVID-19 declaration under Section 564(b)(1) of the Act, 21 U.S.C.section 360bbb-3(b)(1), unless the authorization is terminated  or revoked sooner.       Influenza A by PCR NEGATIVE NEGATIVE Final   Influenza B  by PCR NEGATIVE NEGATIVE Final    Comment: (NOTE) The Xpert Xpress SARS-CoV-2/FLU/RSV plus assay is intended as an aid in the diagnosis of influenza from Nasopharyngeal swab specimens and should not be used as a sole basis for treatment. Nasal washings and aspirates are unacceptable for Xpert Xpress SARS-CoV-2/FLU/RSV testing.  Fact Sheet for Patients: EntrepreneurPulse.com.au  Fact Sheet for Healthcare Providers: IncredibleEmployment.be  This test is not yet approved or cleared by the [place] FDA and has been authorized for detection and/or diagnosis of SARS-CoV-2 by FDA under an Emergency Use Authorization (EUA). This EUA will remain in effect (meaning this test can be used) for the duration of the COVID-19 declaration under Section 564(b)(1) of the Act, 21 U.S.C. section 360bbb-3(b)(1), unless the authorization is terminated or revoked.  Performed at [facility], [place] [address], [place], [place] [address]      Labs: BNP (last 3 results) No results for input(s): BNP in the last 8760 hours. Basic Metabolic Panel: Recent Labs  Lab 11/05/20 0710  11/05/20 0714 11/05/20 0754 11/06/20 0121  NA 133* 134* 134* 138  K 5.8* 5.8* 5.8* 5.0  CL 87* 95*  --  98  CO2 24  --   --  20*  GLUCOSE 665* 657*  --  189*  BUN 66* 67*  --  80*  CREATININE 11.12* 11.50*  --  11.23*  CALCIUM 9.8  --   --  8.2*  PHOS  --   --   --  7.2*   Liver Function Tests: Recent Labs  Lab 11/05/20 0710 11/06/20 0121  AST 24  --   ALT 13  --   ALKPHOS 91  --   BILITOT 0.7  --   PROT 9.7*  --   ALBUMIN 4.2 3.2*   Recent Labs  Lab 11/05/20 0710  LIPASE 61*   No results for input(s): AMMONIA in the last 168 hours. CBC: Recent Labs  Lab 11/05/20 0710 11/05/20 0714 11/05/20 0754 11/05/20 2145 11/06/20 0121  WBC 14.3*  --   --   --  14.2*  NEUTROABS 12.0*  --   --   --   --   HGB 14.9 17.3* 19.0* 13.3 12.2  HCT 47.1* 51.0* 56.0* 43.7 39.6  MCV 93.6  --   --   --  98.0  PLT 172  --   --   --  130*   Cardiac Enzymes: No results for input(s): CKTOTAL, CKMB, CKMBINDEX, TROPONINI in the last 168 hours. BNP: Invalid input(s): POCBNP CBG: Recent Labs  Lab 11/05/20 1728 11/05/20 2012 11/06/20 0003 11/06/20 0615 11/06/20 1304  GLUCAP 273* 239* 196* 195* 126*   D-Dimer No results for input(s): DDIMER in the last 72 hours. Hgb A1c Recent Labs    11/05/20 0710  HGBA1C 9.2*   Lipid Profile No results for input(s): CHOL, HDL, LDLCALC, TRIG, CHOLHDL, LDLDIRECT in the last 72 hours. Thyroid function studies No results for input(s): TSH, T4TOTAL, T3FREE, THYROIDAB in the last 72 hours.  Invalid input(s): FREET3 Anemia work up No results for input(s): VITAMINB12, FOLATE, FERRITIN, TIBC, IRON, RETICCTPCT in the last 72 hours. Urinalysis    Component Value Date/Time   COLORURINE RED (A) 09/03/2018 1118   APPEARANCEUR CLOUDY (A) 09/03/2018 1118   LABSPEC 1.015 09/03/2018 1118   PHURINE 8.0 09/03/2018 1118   GLUCOSEU 250 (A) 09/03/2018 1118   HGBUR LARGE (A) 09/03/2018 1118   HGBUR negative 01/26/2010 [place]  09/03/2018 1118   BILIRUBINUR  NEG 01/01/2011 1050   KETONESUR NEGATIVE 09/03/2018 1118   PROTEINUR >300 (A) 09/03/2018 1118   UROBILINOGEN 0.2 01/09/2015 1333   NITRITE POSITIVE (A) 09/03/2018 1118   LEUKOCYTESUR TRACE (A) 09/03/2018 1118   Sepsis Labs Invalid input(s): PROCALCITONIN,  WBC,  LACTICIDVEN Microbiology Recent Results (from the past 240 hour(s))  Resp Panel by RT-PCR (Flu A&B, Covid) Nasopharyngeal Swab     Status: None   Collection Time: 11/05/20  9:05 AM   Specimen: Nasopharyngeal Swab; Nasopharyngeal(NP) swabs in vial transport medium  Result Value Ref Range Status   SARS Coronavirus 2 by RT PCR NEGATIVE NEGATIVE Final    Comment: (NOTE) SARS-CoV-2 target nucleic acids are NOT DETECTED.  The SARS-CoV-2 RNA is generally detectable in upper respiratory specimens during the acute phase of infection. The lowest concentration of SARS-CoV-2 viral copies this assay can detect is 138 copies/mL. A negative result does not preclude SARS-Cov-2 infection and should not be used as the sole basis for treatment or other patient management decisions. A negative result may occur with  improper specimen collection/handling, submission of specimen other than nasopharyngeal swab, presence of viral mutation(s) within the areas targeted by this assay, and inadequate number of viral copies(<138 copies/mL). A negative result must be combined with clinical observations, patient history, and epidemiological information. The expected result is Negative.  Fact Sheet for Patients:  EntrepreneurPulse.com.au  Fact Sheet for Healthcare Providers:  IncredibleEmployment.be  This test is no t yet approved or cleared by the [place] FDA and  has been authorized for detection and/or diagnosis of SARS-CoV-2 by FDA under an Emergency Use Authorization (EUA). This EUA will remain  in effect (meaning this test can be used) for the duration of the COVID-19  declaration under Section 564(b)(1) of the Act, 21 U.S.C.section 360bbb-3(b)(1), unless the authorization is terminated  or revoked sooner.       Influenza A by PCR NEGATIVE NEGATIVE Final   Influenza B by PCR NEGATIVE NEGATIVE Final    Comment: (NOTE) The Xpert Xpress SARS-CoV-2/FLU/RSV plus assay is intended as an aid in the diagnosis of influenza from Nasopharyngeal swab specimens and should not be used as a sole basis for treatment. Nasal washings and aspirates are unacceptable for Xpert Xpress SARS-CoV-2/FLU/RSV testing.  Fact Sheet for Patients: EntrepreneurPulse.com.au  Fact Sheet for Healthcare Providers: IncredibleEmployment.be  This test is not yet approved or cleared by the [place] FDA and has been authorized for detection and/or diagnosis of SARS-CoV-2 by FDA under an Emergency Use Authorization (EUA). This EUA will remain in effect (meaning this test can be used) for the duration of the COVID-19 declaration under Section 564(b)(1) of the Act, 21 U.S.C. section 360bbb-3(b)(1), unless the authorization is terminated or revoked.  Performed at [facility], [place] [address], [place], [place] [address]     Please note: You were cared for by a hospitalist during your hospital stay. Once you are discharged, your primary care physician will handle any further medical issues. Please note that NO REFILLS for any discharge medications will be authorized once you are discharged, as it is imperative that you return to your primary care physician (or establish a relationship with a primary care physician if you do not have one) for your post hospital discharge needs so that they can reassess your need for medications and monitor your lab values.    Time coordinating discharge: 40 minutes  SIGNED:   Shelly Coss, MD  [facility] 11/06/2020, 2:46 PM Pager [phone]  If 7PM-7AM,  please contact  night-coverage [email or web address] Password [email or web address]

## 2020-11-07 NOTE — TOC Transition Note (Addendum)
Transition of care contact from inpatient facility  Date of discharge: 11/06/20 Date of contact: 11/07/20 Method: Attempted phone call Spoke to: No Answer  Attempted to call Ms. Vogelgesang to discuss recent hospitalization but she did not pick up the phone. Unable to leave voicemail. Message states: "has a voice mailbox that has not been set up yet".   Plan for renal team to follow-up with her at her next scheduled HD on 11/10/20 at [facility].  Tobie Poet, NP

## 2020-11-10 ENCOUNTER — Other Ambulatory Visit: Payer: Self-pay

## 2020-11-10 ENCOUNTER — Encounter (HOSPITAL_BASED_OUTPATIENT_CLINIC_OR_DEPARTMENT_OTHER): Payer: Self-pay | Admitting: Obstetrics and Gynecology

## 2020-11-10 DIAGNOSIS — Z794 Long term (current) use of insulin: Secondary | ICD-10-CM | POA: Insufficient documentation

## 2020-11-10 DIAGNOSIS — R1013 Epigastric pain: Secondary | ICD-10-CM | POA: Diagnosis not present

## 2020-11-10 DIAGNOSIS — E119 Type 2 diabetes mellitus without complications: Secondary | ICD-10-CM | POA: Diagnosis not present

## 2020-11-10 DIAGNOSIS — R112 Nausea with vomiting, unspecified: Secondary | ICD-10-CM | POA: Insufficient documentation

## 2020-11-10 DIAGNOSIS — R1012 Left upper quadrant pain: Secondary | ICD-10-CM | POA: Diagnosis not present

## 2020-11-10 DIAGNOSIS — Z79899 Other long term (current) drug therapy: Secondary | ICD-10-CM | POA: Diagnosis not present

## 2020-11-10 DIAGNOSIS — I10 Essential (primary) hypertension: Secondary | ICD-10-CM | POA: Diagnosis not present

## 2020-11-10 NOTE — ED Notes (Signed)
Lab called with insufficient quantity of blood from initial blood draw. Attempted 2 additional straight sticks, with flash back, with insufficient quantity. Pt is limited access to the left arm

## 2020-11-10 NOTE — ED Triage Notes (Signed)
Patient reports to the ER reporting she is vomiting blood. Patient reports abdominal pain and states she has not ate in 3 days and cannot eat anything on her stomach

## 2020-11-11 ENCOUNTER — Encounter (HOSPITAL_COMMUNITY): Payer: Self-pay | Admitting: Internal Medicine

## 2020-11-11 ENCOUNTER — Encounter (HOSPITAL_COMMUNITY): Payer: Self-pay | Admitting: *Deleted

## 2020-11-11 ENCOUNTER — Emergency Department (HOSPITAL_BASED_OUTPATIENT_CLINIC_OR_DEPARTMENT_OTHER)
Admission: EM | Admit: 2020-11-11 | Discharge: 2020-11-11 | Disposition: A | Discharge status: Home / Self Care | Payer: Medicare Other | Attending: Emergency Medicine | Admitting: Emergency Medicine

## 2020-11-11 ENCOUNTER — Emergency Department (HOSPITAL_BASED_OUTPATIENT_CLINIC_OR_DEPARTMENT_OTHER): Payer: Medicare Other

## 2020-11-11 DIAGNOSIS — R1013 Epigastric pain: Secondary | ICD-10-CM

## 2020-11-11 DIAGNOSIS — R1110 Vomiting, unspecified: Secondary | ICD-10-CM

## 2020-11-11 HISTORY — DX: Essential (primary) hypertension: I10

## 2020-11-11 HISTORY — DX: Type 2 diabetes mellitus without complications: E11.9

## 2020-11-11 HISTORY — DX: Disorder of kidney and ureter, unspecified: N28.9

## 2020-11-11 HISTORY — DX: Unspecified convulsions: R56.9

## 2020-11-11 LAB — COMPREHENSIVE METABOLIC PANEL
ALT: 7 U/L (ref 0–44)
AST: 15 U/L (ref 15–41)
Albumin: 4.7 g/dL (ref 3.5–5.0)
Alkaline Phosphatase: 58 U/L (ref 38–126)
Anion gap: 15 (ref 5–15)
BUN: 19 mg/dL (ref 6–20)
CO2: 28 mmol/L (ref 22–32)
Calcium: 9.6 mg/dL (ref 8.9–10.3)
Chloride: 94 mmol/L — ABNORMAL LOW (ref 98–111)
Creatinine, Ser: 6.72 mg/dL — ABNORMAL HIGH (ref 0.44–1.00)
GFR, Estimated: 8 mL/min — ABNORMAL LOW (ref >60)
Glucose, Bld: 159 mg/dL — ABNORMAL HIGH (ref 70–99)
Potassium: 4 mmol/L (ref 3.5–5.1)
Sodium: 137 mmol/L (ref 135–145)
Total Bilirubin: 0.5 mg/dL (ref 0.3–1.2)
Total Protein: 9 g/dL — ABNORMAL HIGH (ref 6.5–8.1)

## 2020-11-11 LAB — HCG, QUANTITATIVE, PREGNANCY: hCG, Beta Chain, Quant, S: 9 m[IU]/mL — ABNORMAL HIGH (ref <5)

## 2020-11-11 LAB — CBC
HCT: 40.7 % (ref 36.0–46.0)
Hemoglobin: 12.6 g/dL (ref 12.0–15.0)
MCH: 28.9 pg (ref 26.0–34.0)
MCHC: 31 g/dL (ref 30.0–36.0)
MCV: 93.3 fL (ref 80.0–100.0)
Platelets: 178 10*3/uL (ref 150–400)
RBC: 4.36 MIL/uL (ref 3.87–5.11)
RDW: 17.4 % — ABNORMAL HIGH (ref 11.5–15.5)
WBC: 5.5 10*3/uL (ref 4.0–10.5)
nRBC: 0 % (ref 0.0–0.2)

## 2020-11-11 LAB — LIPASE, BLOOD: Lipase: 46 U/L (ref 11–51)

## 2020-11-11 MED ORDER — METOCLOPRAMIDE HCL 5 MG/ML IJ SOLN
10.0000 mg | Freq: Once | INTRAMUSCULAR | Status: AC
  Administered 2020-11-11: 10 mg via INTRAVENOUS
  Filled 2020-11-11: qty 2

## 2020-11-11 MED ORDER — LIDOCAINE VISCOUS HCL 2 % MT SOLN: 15.0000 mL | Freq: Four times a day (QID) | OROMUCOSAL | 0 refills | Status: DC | PRN

## 2020-11-11 MED ORDER — ALUM & MAG HYDROXIDE-SIMETH 200-200-20 MG/5ML PO SUSP
30.0000 mL | Freq: Once | ORAL | Status: AC
  Administered 2020-11-11: 30 mL via ORAL
  Filled 2020-11-11: qty 30

## 2020-11-11 MED ORDER — SODIUM CHLORIDE 0.9 % IV BOLUS (SEPSIS)
1000.0000 mL | Freq: Once | INTRAVENOUS | Status: AC
  Administered 2020-11-11: 1000 mL via INTRAVENOUS

## 2020-11-11 MED ORDER — SUCRALFATE 1 GM/10ML PO SUSP
1.0000 g | Freq: Once | ORAL | Status: AC
  Administered 2020-11-11: 1 g via ORAL
  Filled 2020-11-11: qty 10

## 2020-11-11 MED ORDER — MORPHINE SULFATE (PF) 4 MG/ML IV SOLN
4.0000 mg | Freq: Once | INTRAVENOUS | Status: AC
  Administered 2020-11-11: 4 mg via INTRAVENOUS
  Filled 2020-11-11: qty 1

## 2020-11-11 MED ORDER — SODIUM CHLORIDE 0.9 % IV SOLN
1000.0000 mL | INTRAVENOUS | Status: DC
  Administered 2020-11-11: 1000 mL via INTRAVENOUS

## 2020-11-11 MED ORDER — LIDOCAINE VISCOUS HCL 2 % MT SOLN
15.0000 mL | Freq: Once | OROMUCOSAL | Status: AC
  Administered 2020-11-11: 15 mL via ORAL
  Filled 2020-11-11: qty 15

## 2020-11-11 MED ORDER — LIDOCAINE VISCOUS HCL 2 % MT SOLN
15.0000 mL | Freq: Once | OROMUCOSAL | Status: AC
  Administered 2020-11-11: 15 mL via OROMUCOSAL
  Filled 2020-11-11: qty 15

## 2020-11-11 NOTE — ED Provider Notes (Addendum)
[facility] EMERGENCY DEPT Provider Note  CSN: [id number] Arrival date & time: 11/10/20 2125  Chief Complaint(s) Emesis  HPI [name] is a 35 y.o. female with a past medical history listed below including gastroparesis and esophagitis recently seen on EGD who presents to the emergency department epigastric abdominal discomfort consistent with her esophagitis.  Symptoms have not improved by home prescription medication. Also endorsing nausea with mild hematemesis which has improved.  She reports inability to tolerate oral intake. No chest pain or shortness of breath. No lower abdominal pain No diarrhea. No other physical complaints.    Emesis  Past Medical History Past Medical History:  Diagnosis Date   Diabetes mellitus without complication ([place])    Hypertension    Renal disorder    Seizures ([place])    There are no problems to display for this patient.  Home Medication(s) Prior to Admission medications   Medication Sig Start Date End Date Taking? Authorizing Provider  lidocaine (XYLOCAINE) 2 % solution Use as directed 15 mLs in the mouth or throat every 6 (six) hours as needed for mouth pain. 11/11/20  Yes Rashawnda Gaba, Grayce Sessions, MD  BD PEN NEEDLE NANO 2ND GEN 32G X 4 MM MISC 4 (four) times daily. as directed 08/13/20   [provider]  calcium acetate (PHOSLO) 667 MG capsule Take 1,334 mg by mouth 3 (three) times daily. 10/26/20   [provider]  Calcium Carbonate Antacid (CALCIUM CARBONATE, DOSED IN MG ELEMENTAL CALCIUM,) 1250 MG/5ML SUSP Take 500 mg of elemental calcium by mouth daily. 10/22/20   [provider]  famotidine (PEPCID) 40 MG tablet Take 40 mg by mouth at bedtime. 06/26/20   [provider]  gabapentin (NEURONTIN) 300 MG capsule Take 300 mg by mouth at bedtime. 11/03/20   [provider]  hydrOXYzine (ATARAX/VISTARIL) 50 MG tablet Take 50 mg by mouth 2 (two) times daily as needed. 11/03/20   [provider]  loperamide (IMODIUM) 2 MG capsule Take 2 mg by mouth 2 (two) times daily as needed. 09/11/20   [provider]  midodrine (PROAMATINE) 10 MG tablet Take by mouth. 09/22/20   [provider]  NOVOLOG FLEXPEN 100 UNIT/ML FlexPen Inject into the skin. 11/06/20   [provider]  ondansetron (ZOFRAN-ODT) 4 MG disintegrating tablet Take 4 mg by mouth every 8 (eight) hours as needed. 11/04/20   [provider]  oxyCODONE-acetaminophen (PERCOCET/ROXICET) 5-325 MG tablet Take 1 tablet by mouth every 6 (six) hours as needed. 09/25/20   [provider]  pantoprazole (PROTONIX) 40 MG tablet Take 40 mg by mouth 2 (two) times daily. 11/06/20   [provider]                                                                                                                                    Past Surgical History ** The histories are not reviewed yet. Please review them in  the "History" navigator section and refresh this [place]. Family History No family history on file.  Social History Social History   Tobacco Use   Smoking status: Never    Passive exposure: Never   Smokeless tobacco: Never  Vaping Use   Vaping Use: Never used  Substance Use Topics   Alcohol use: Not Currently   Drug use: Never   Allergies Patient has no allergy information on record.  Review of Systems Review of Systems  Gastrointestinal:  Positive for vomiting.  All other systems are reviewed and are negative for acute change except as noted in the HPI  Physical Exam Vital Signs  I have reviewed the triage vital signs BP (!) 165/109 (BP Location: Right Arm)   Pulse 97   Temp 98.2 F (36.8 C) (Oral)   Resp 16   LMP  (LMP Unknown)   SpO2 100%   Physical Exam Vitals reviewed.  Constitutional:      General: She is not in acute distress.    Appearance: She is well-developed. She is not diaphoretic.  HENT:     Head: Normocephalic and atraumatic.      Right Ear: External ear normal.     Left Ear: External ear normal.     Nose: Nose normal.  Eyes:     General: No scleral icterus.    Conjunctiva/sclera: Conjunctivae normal.  Neck:     Trachea: Phonation normal.  Cardiovascular:     Rate and Rhythm: Normal rate and regular rhythm.  Pulmonary:     Effort: Pulmonary effort is normal. No respiratory distress.     Breath sounds: No stridor.  Abdominal:     General: There is no distension.     Tenderness: There is abdominal tenderness in the epigastric area and left upper quadrant.  Musculoskeletal:        General: Normal range of motion.     Cervical back: Normal range of motion.  Neurological:     Mental Status: She is alert and oriented to person, place, and time.  Psychiatric:        Behavior: Behavior normal.    ED Results and Treatments Labs (all labs ordered are listed, but only abnormal results are displayed) Labs Reviewed  CBC - Abnormal; Notable for the following components:      Result Value   RDW 17.4 (*)    All other components within normal limits  COMPREHENSIVE METABOLIC PANEL - Abnormal; Notable for the following components:   Chloride 94 (*)    Glucose, Bld 159 (*)    Creatinine, Ser 6.72 (*)    Total Protein 9.0 (*)    GFR, Estimated 8 (*)    All other components within normal limits  HCG, QUANTITATIVE, PREGNANCY - Abnormal; Notable for the following components:   hCG, Beta Chain, Quant, S 9 (*)    All other components within normal limits  LIPASE, BLOOD  URINALYSIS, ROUTINE W REFLEX MICROSCOPIC  EKG  EKG Interpretation  Date/Time:  Tuesday November 11 2020 03:47:50 EDT Ventricular Rate:  89 PR Interval:  145 QRS Duration: 98 QT Interval:  414 QTC Calculation: 504 R Axis:   68 Text Interpretation: Sinus rhythm Low voltage, precordial leads Borderline prolonged QT interval Confirmed  by Addison Lank [phone]) on 11/11/2020 4:56:04 AM       Radiology DG Chest Port 1 View  Result Date: 11/11/2020 CLINICAL DATA:  Abdominal pain and vomiting blood. EXAM: PORTABLE CHEST 1 VIEW COMPARISON:  November 05, 2020 FINDINGS: The heart size and mediastinal contours are within normal limits. A femoral tunneled dialysis catheter is again seen and unchanged in position. Both lungs are clear. Radiopaque surgical clips are seen along the left axilla. The visualized skeletal structures are unremarkable. IMPRESSION: No active cardiopulmonary disease. Electronically Signed   By: Virgina Norfolk M.D.   On: 11/11/2020 01:47    Pertinent labs & imaging results that were available during my care of the patient were reviewed by me and considered in my medical decision making (see MDM for details).  Medications Ordered in ED Medications  sodium chloride 0.9 % bolus 1,000 mL (0 mLs Intravenous Stopped 11/11/20 0500)    Followed by  0.9 %  sodium chloride infusion (1,000 mLs Intravenous New Bag/Given 11/11/20 0504)  metoCLOPramide (REGLAN) injection 10 mg (10 mg Intravenous Given 11/11/20 0323)  morphine 4 MG/ML injection 4 mg (4 mg Intravenous Given 11/11/20 0323)  sucralfate (CARAFATE) 1 GM/10ML suspension 1 g (1 g Oral Given 11/11/20 0318)  lidocaine (XYLOCAINE) 2 % viscous mouth solution 15 mL (15 mLs Mouth/Throat Given 11/11/20 0318)  alum & mag hydroxide-simeth (MAALOX/MYLANTA) 200-200-20 MG/5ML suspension 30 mL (30 mLs Oral Given 11/11/20 0519)    And  lidocaine (XYLOCAINE) 2 % viscous mouth solution 15 mL (15 mLs Oral Given 11/11/20 [id number])                                                                                                                                     Procedures Ultrasound ED Peripheral IV (Provider)  Date/Time: 11/11/2020 7:38 AM Performed by: Fatima Blank, MD Authorized by: Fatima Blank, MD   Procedure details:    Indications: multiple failed IV attempts      Skin Prep: chlorhexidine gluconate     Location:  Right AC   Angiocath:  20 G   Bedside Ultrasound Guided: Yes     Images: archived     Patient tolerated procedure without complications: Yes     Dressing applied: Yes    (including critical care time)  Medical Decision Making / ED Course I have reviewed the nursing notes for this encounter and the patient's prior records (if available in EHR or on provided paperwork).  Nelta Dunsford was evaluated in Emergency Department on 11/11/2020 for the symptoms described in the history of present illness. She was evaluated in the context of the global  COVID-19 pandemic, which necessitated consideration that the patient might be at risk for infection with the SARS-CoV-2 virus that causes COVID-19. Institutional protocols and algorithms that pertain to the evaluation of patients at risk for COVID-19 are in a state of rapid change based on information released by regulatory bodies including the CDC and federal and state organizations. These policies and algorithms were followed during the patient's care in the ED.     Epigastric abdominal pain with nausea and vomiting.  Reported hematemesis. Screening labs and chest x-ray obtained.  Ultrasound-guided IV required due to multiple failed attempts. IV fluids, antiemetics, pain medicine ordered.  Pertinent labs & imaging results that were available during my care of the patient were reviewed by me and considered in my medical decision making:  Labs reassuring without leukocytosis or anemia.  No significant electrolyte derangements. No evidence of bili obstruction or pancreatitis. Chest x-ray without evidence of pneumomediastinum concerning for esophageal perforation.  Patient symptoms improved able to tolerate oral intake.  She was given GI cocktail which also helped. Due to ESRD will not Rx Carafate.   Final Clinical Impression(s) / ED Diagnoses Final diagnoses:  Emesis  Epigastric abdominal pain    The patient appears reasonably screened and/or stabilized for discharge and I doubt any other medical condition or other [facility] requiring further screening, evaluation, or treatment in the ED at this time prior to discharge. Safe for discharge with strict return precautions.  Disposition: Discharge  Condition: Good  I have discussed the results, Dx and Tx plan with the patient/family who expressed understanding and agree(s) with the plan. Discharge instructions discussed at length. The patient/family was given strict return precautions who verbalized understanding of the instructions. No further questions at time of discharge.    ED Discharge Orders          Ordered    lidocaine (XYLOCAINE) 2 % solution  Every 6 hours PRN        11/11/20 0522             This chart was dictated using voice recognition software.  Despite best efforts to proofread,  errors can occur which can change the documentation meaning.      Fatima Blank, MD 11/11/20 [phone]

## 2020-11-11 NOTE — Discharge Instructions (Addendum)
Continue to take your calcium carbonate suspension, protonix, and pepcid. In addition, I have prescribed you Lidocaine for additional relief.

## 2020-11-25 ENCOUNTER — Ambulatory Visit: Payer: Medicare Other | Admitting: Vascular Surgery

## 2020-12-11 ENCOUNTER — Ambulatory Visit: Payer: Medicare Other | Admitting: Nurse Practitioner

## 2020-12-15 ENCOUNTER — Ambulatory Visit: Payer: Medicare Other | Admitting: Nurse Practitioner

## 2020-12-23 ENCOUNTER — Ambulatory Visit (INDEPENDENT_AMBULATORY_CARE_PROVIDER_SITE_OTHER): Payer: Medicare Other | Admitting: Gastroenterology

## 2020-12-23 ENCOUNTER — Encounter: Payer: Self-pay | Admitting: Gastroenterology

## 2020-12-23 VITALS — BP 136/92 | HR 90 | Ht 66.0 in | Wt 171.0 lb

## 2020-12-23 DIAGNOSIS — K208 Other esophagitis without bleeding: Secondary | ICD-10-CM

## 2020-12-23 DIAGNOSIS — K3184 Gastroparesis: Secondary | ICD-10-CM

## 2020-12-23 DIAGNOSIS — K2101 Gastro-esophageal reflux disease with esophagitis, with bleeding: Secondary | ICD-10-CM | POA: Diagnosis not present

## 2020-12-23 MED ORDER — METOCLOPRAMIDE HCL 5 MG PO TABS: 5.0000 mg | ORAL_TABLET | Freq: Three times a day (TID) | ORAL | 3 refills | Status: DC

## 2020-12-23 MED ORDER — METOCLOPRAMIDE HCL 10 MG PO TABS
10.0000 mg | ORAL_TABLET | Freq: Three times a day (TID) | ORAL | 3 refills | Status: DC
Start: 2020-12-23 — End: 2021-02-19

## 2020-12-23 NOTE — Progress Notes (Signed)
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   Patient has chronic nausea and vomiting with history of gastroparesis. She has known severe esophagitis, likely from chronic nausea and vomiting.  We saw her in the hospital late May for abdominal pain, nausea and vomiting.  Upper endoscopy showed severe distal esophagitis.  CT scan that admission was unremarkable except for esophageal wall thickening. Prior to that in 2019 she had an EGD for hematemesis with findings of distal esophagitis and retained food in the stomach.  Most recently she presented back to the hospital again in August at which point our team saw her in consult, but did not repeat EGD.  They talked about repeating an EGD at some point to assess for any healing.  She presents here today for follow-up of that hospital stay.  She tells me she cannot take the omeprazole because it is in capsule form and she cannot get that to go down.  She has Protonix as well and says that she tries to take that regularly twice a day if she can get it down.  She tells me that she has Reglan and she is taking that 2 or 3 times a day, but is unsure of the dose.  It is not on her current prescription list.  Previously they were taking caution with Zofran because of her prolonged QT interval.  She tells me that she cannot eat, she is losing weight.  She tells me that stuff will get hung up in her esophagus and then comes back up again.  She is frustrated with everybody always giving her medications for this issue and she wants to know what is causing it and something needs to be done.  EGD 07/2020: - LA Grade D reflux esophagitis with no bleeding. - Erythematous mucosa in the gastric fundus. - Normal  examined duodenum. - No specimens collected.  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   Pyelonephritis 06/23/2014   Renal disorder    Seasonal allergies    Seizures ([place])    UTI (urinary tract infection) 01/09/2015   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   Qualifier: Diagnosis of  By: Carlena Sax  MD, Colletta [place]     Past Surgical History:  Procedure Laterality Date   ARTERIOVENOUS GRAFT PLACEMENT Bilateral    "neither work" (10/24/2017)   AV FISTULA PLACEMENT Left 10/26/2018   Procedure: CREATION OF ARTERIOVENOUS FISTULA  LEFT ARM;  Surgeon: [name], MD;  Location: [place];  Service: Vascular;  Laterality: Left;   AV FISTULA PLACEMENT Left 05/23/2020   Procedure: LEFT ARM ARTERIOVENOUS (AV) FISTULA CREATION;  Surgeon: [name], MD;  Location: [place];  Service: Vascular;  Laterality: Left;   [place] Left 12/21/2018   Procedure: Left arm BASILIC VEIN  TRANSPOSITION SECOND STAGE;  Surgeon: [name], MD;  Location: [facility];  Service: Vascular;  Laterality: Left;   CHOLECYSTECTOMY N/A 06/30/2017   Procedure: LAPAROSCOPIC CHOLECYSTECTOMY WITH INTRAOPERATIVE CHOLANGIOGRAM;  Surgeon: Excell Seltzer, MD;  Location: [facility];  Service: General;  Laterality: N/A;   ESOPHAGOGASTRODUODENOSCOPY (EGD) WITH PROPOFOL N/A 07/04/2017   Procedure: ESOPHAGOGASTRODUODENOSCOPY (EGD) WITH PROPOFOL;  Surgeon: Clarene Essex, MD;  Location: [facility];  Service: Endoscopy;  Laterality: N/A;   ESOPHAGOGASTRODUODENOSCOPY (EGD) WITH PROPOFOL N/A 08/10/2020   Procedure:  ESOPHAGOGASTRODUODENOSCOPY (EGD) WITH PROPOFOL;  Surgeon: Doran Stabler, MD;  Location: [place] [facility];  Service: Gastroenterology;  Laterality: N/A;   INSERTION OF DIALYSIS CATHETER N/A 03/20/2018   Procedure: INSERTION OF TUNNELED DIALYSIS CATHETER - RIGHT INTERANL JUGULAR PLACEMENT;  Surgeon: Angelia Mould, MD;  Location: [facility];  Service: Vascular;  Laterality: N/A;   IR FLUORO GUIDE CV LINE RIGHT  04/18/2020   IR GUIDED DRAIN W CATHETER PLACEMENT  10/28/2018   KIDNEY TRANSPLANT  2007   KIDNEY TRANSPLANT Right    PARATHYROIDECTOMY  ?2012   "3/4 removed" (10/24/2017)   RENAL BIOPSY Bilateral 2003   REVISON OF ARTERIOVENOUS FISTULA Left 09/24/2020   Procedure: LEFT UPPER ARM ARTERIOVENOUS GRAFT CREATION;  Surgeon: [name], MD;  Location: [facility];  Service: Vascular;  Laterality: Left;   UPPER EXTREMITY VENOGRAPHY Bilateral 10/19/2018   Procedure: UPPER EXTREMITY VENOGRAPHY;  Surgeon: [name], MD;  Location: [facility];  Service: Cardiovascular;  Laterality: Bilateral;  Bilateral    UPPER EXTREMITY VENOGRAPHY Left 09/04/2020   Procedure: UPPER EXTREMITY VENOGRAPHY - Left Upper;  Surgeon: [name], MD;  Location: [place] [facility];  Service: Cardiovascular;  Laterality: Left;    reports that she has never smoked. She has never been exposed to tobacco smoke. She has never used smokeless tobacco. She reports that she does not currently use alcohol. She reports that she does not use drugs. family history includes Aneurysm in her mother; Arthritis in her mother; CAD in her father; Diabetes in her father; Early death in her brother; Hypertension in her mother. Allergies  Allergen Reactions   Diphenhydramine Anaphylaxis   Doxycycline Shortness Of Breath   Motrin [Ibuprofen] Shortness Of Breath and Itching   Contrast Media [Iodinated Diagnostic Agents] Itching   Shellfish Allergy Hives   Banana Itching, Nausea And Vomiting and Other (See Comments)     Sick on the stomach   Chlorhexidine Itching   Ferrous Sulfate Itching   Iron Dextran Itching and Other (See Comments)    Vein irritation       Outpatient Encounter Medications as of 12/23/2020  Medication Sig   ACCU-CHEK SOFTCLIX LANCETS lancets Use to check blood sugar 4 times per day.   acetaminophen (TYLENOL) 500 MG tablet Take 1,000 mg by mouth 2 (two) times daily.   BD PEN NEEDLE NANO 2ND GEN 32G X 4 MM MISC 4 (four) times daily. as directed   calcium acetate (PHOSLO) 667 MG capsule Take 1,334 mg by mouth 3 (three) times daily.   Calcium Carbonate Antacid (CALCIUM CARBONATE, DOSED IN MG ELEMENTAL CALCIUM,) 1250 MG/5ML SUSP Take 500 mg of elemental calcium by mouth daily.   cetirizine (ZYRTEC) 10 MG tablet Take 10 mg by mouth daily as needed for allergies.   famotidine (PEPCID) 40 MG tablet Take 40 mg by mouth at bedtime.   fluticasone (FLONASE) 50 MCG/ACT nasal spray Place 2 sprays into both nostrils daily as needed for allergies.  gabapentin (NEURONTIN) 300 MG capsule Take 300 mg by mouth at bedtime.   hydrOXYzine (ATARAX/VISTARIL) 50 MG tablet Take 50 mg by mouth 2 (two) times daily as needed.   insulin aspart (NOVOLOG) 100 UNIT/ML FlexPen Inject 2-10 Units into the skin 3 (three) times daily with meals. Sliding scale   insulin degludec (TRESIBA) 100 UNIT/ML FlexTouch Pen Inject 20 Units into the skin daily.   Insulin Pen Needle (BD PEN NEEDLE NANO 2ND GEN) 32G X 4 MM MISC 1 each by Does not apply route in the morning, at noon, in the evening, and at bedtime.   Insulin Pen Needle 32G X 4 MM MISC Use as directed   lidocaine (XYLOCAINE) 2 % solution Use as directed 15 mLs in the mouth or throat every 6 (six) hours as needed for mouth pain.   loperamide (IMODIUM) 1 MG/5ML solution Take 2 mg by mouth 2 (two) times daily as needed for diarrhea or loose stools.    loperamide (IMODIUM) 2 MG capsule Take 2 mg by mouth 2 (two) times daily as needed.   metoprolol tartrate (LOPRESSOR) 25 MG  tablet Take 1 tablet (25 mg total) by mouth 2 (two) times daily.   midodrine (PROAMATINE) 10 MG tablet Take by mouth.   Multiple Vitamins-Minerals (EQ MULTIVITAMINS ADULT GUMMY PO) Take 2 tablets by mouth daily.   NOVOLOG FLEXPEN 100 UNIT/ML FlexPen Inject into the skin.   ondansetron (ZOFRAN-ODT) 4 MG disintegrating tablet Take 4 mg by mouth every 8 (eight) hours as needed for nausea/vomiting.   ondansetron (ZOFRAN-ODT) 4 MG disintegrating tablet Take 4 mg by mouth every 8 (eight) hours as needed.   oxyCODONE-acetaminophen (PERCOCET/ROXICET) 5-325 MG tablet Take 1 tablet by mouth every 6 (six) hours as needed.   pantoprazole (PROTONIX) 40 MG tablet Take 40 mg by mouth 2 (two) times daily.   [DISCONTINUED] calcium acetate (PHOSLO) 667 MG capsule Take 1,334 mg by mouth 2 (two) times daily.   [DISCONTINUED] Calcium Carbonate Antacid (CALCIUM CARBONATE, DOSED IN MG ELEMENTAL CALCIUM,) 1250 MG/5ML SUSP Take 500 mg of elemental calcium by mouth daily. 5 ml (Patient not taking: Reported on 12/23/2020)   [DISCONTINUED] famotidine (PEPCID) 40 MG tablet Take 40 mg by mouth at bedtime. (Patient not taking: Reported on 12/23/2020)   [DISCONTINUED] gabapentin (NEURONTIN) 300 MG capsule Take 300 mg by mouth 3 (three) times daily. (Patient not taking: Reported on 12/23/2020)   [DISCONTINUED] hydrOXYzine (ATARAX/VISTARIL) 50 MG tablet Take 1 tablet (50 mg total) by mouth 2 (two) times daily as needed for itching. (Patient not taking: Reported on 12/23/2020)   [DISCONTINUED] oxyCODONE-acetaminophen (PERCOCET) 5-325 MG tablet Take 1 tablet by mouth every 6 (six) hours as needed. (Patient not taking: Reported on 12/23/2020)   [DISCONTINUED] pantoprazole (PROTONIX) 40 MG tablet Take 1 tablet (40 mg total) by mouth 2 (two) times daily.   No facility-administered encounter medications on file as of 12/23/2020.     REVIEW OF SYSTEMS  : All other systems reviewed and negative except where noted in the History of  Present Illness.   PHYSICAL EXAM: BP (!) 136/92   Pulse 90   Ht '5\' 6"'$  (1.676 m)   Wt 171 lb (77.6 kg)   SpO2 96%   BMI 27.60 kg/m  General: Well developed white female in no acute distress Head: Normocephalic and atraumatic Eyes:  Sclerae anicteric, conjunctiva pink. Ears: Normal auditory acuity Lungs: Clear throughout to auscultation; no W/R/R. Heart: Regular rate and rhythm; no M/R/G. Abdomen: Soft, non-distended.  BS present.  Non-tender.  Musculoskeletal: Symmetrical with no gross deformities  Skin: No lesions on visible extremities Extremities: No edema  Neurological: Alert oriented x 4, grossly non-focal Psychological:  Alert and cooperative. Normal mood and affect  ASSESSMENT AND PLAN: *Longstanding abdominal pain, nausea, vomiting in patient with diabetic gastroparesis, GERD.  Recurrent hematemesis.  EGD 07/2020 showing severe esophagitis.  Now describing dysphagia, almost like stasis in her esophagus.  This is probably due to the underlying esophagitis causing some esophageal narrowing, but may have a stricture.  She tries to take her pantoprazole twice a day, when she can keep it down.  She says she is taking Reglan usually 2-3 times a day, unsure what dosing it is.  For now I will prescribe Reglan 10 mg for her to be taking before meals and at bedtime.  Prescription sent to pharmacy. We will plan for repeat EGD to reassess findings.  Seems like she did not have much insight into actually what is going on that is causing her symptoms and her issues.  I spent a lot of time trying to explain her situation.  She may need referred to Dr. Derrill Kay at [facility] (gastroparesis/motility clinic).  *IDDM.  Presenting glucose at August hospitalization was 650.  Obviously needs better blood sugar control.  *ESRD.  Hemodialysis on MWF.  Failed renal transplant from 2007 after lasting 14 years.  CC:  [name], MD

## 2020-12-23 NOTE — H&P (View-Only) (Signed)
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   Patient has chronic nausea and vomiting with history of gastroparesis. She has known severe esophagitis, likely from chronic nausea and vomiting.  We saw her in the hospital late May for abdominal pain, nausea and vomiting.  Upper endoscopy showed severe distal esophagitis.  CT scan that admission was unremarkable except for esophageal wall thickening. Prior to that in 2019 she had an EGD for hematemesis with findings of distal esophagitis and retained food in the stomach.  Most recently she presented back to the hospital again in August at which point our team saw her in consult, but did not repeat EGD.  They talked about repeating an EGD at some point to assess for any healing.  She presents here today for follow-up of that hospital stay.  She tells me she cannot take the omeprazole because it is in capsule form and she cannot get that to go down.  She has Protonix as well and says that she tries to take that regularly twice a day if she can get it down.  She tells me that she has Reglan and she is taking that 2 or 3 times a day, but is unsure of the dose.  It is not on her current prescription list.  Previously they were taking caution with Zofran because of her prolonged QT interval.  She tells me that she cannot eat, she is losing weight.  She tells me that stuff will get hung up in her esophagus and then comes back up again.  She is frustrated with everybody always giving her medications for this issue and she wants to know what is causing it and something needs to be done.  EGD 07/2020: - LA Grade D reflux esophagitis with no bleeding. - Erythematous mucosa in the gastric fundus. - Normal  examined duodenum. - No specimens collected.  Past Medical History:  Diagnosis Date   Anemia    Chronic kidney disease    kidney transplant 07   Diabetes mellitus    Pt reports diagnosis in June 2011, Type 2   Diabetes mellitus without complication ([place])    GERD (gastroesophageal reflux disease)    Hyperlipidemia    Hypertension    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   LEARNING DISABILITY 09/25/2007   Qualifier: Diagnosis of  By: Deborra Medina MD, Talia     Pseudoseizures ([place]) 12/22/2012   Pyelonephritis 06/23/2014   Renal disorder    Seasonal allergies    Seizures ([place])    UTI (urinary tract infection) 01/09/2015   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   Qualifier: Diagnosis of  By: Carlena Sax  MD, Colletta [place]     Past Surgical History:  Procedure Laterality Date   ARTERIOVENOUS GRAFT PLACEMENT Bilateral    "neither work" (10/24/2017)   AV FISTULA PLACEMENT Left 10/26/2018   Procedure: CREATION OF ARTERIOVENOUS FISTULA  LEFT ARM;  Surgeon: [name], MD;  Location: [facility];  Service: Vascular;  Laterality: Left;   AV FISTULA PLACEMENT Left 05/23/2020   Procedure: LEFT ARM ARTERIOVENOUS (AV) FISTULA CREATION;  Surgeon: [name], MD;  Location: [facility];  Service: Vascular;  Laterality: Left;   [place] Left 12/21/2018   Procedure: Left arm BASILIC VEIN  TRANSPOSITION SECOND STAGE;  Surgeon: [name], MD;  Location: [facility];  Service: Vascular;  Laterality: Left;   CHOLECYSTECTOMY N/A 06/30/2017   Procedure: LAPAROSCOPIC CHOLECYSTECTOMY WITH INTRAOPERATIVE CHOLANGIOGRAM;  Surgeon: Excell Seltzer, MD;  Location: [facility];  Service: General;  Laterality: N/A;   ESOPHAGOGASTRODUODENOSCOPY (EGD) WITH PROPOFOL N/A 07/04/2017   Procedure: ESOPHAGOGASTRODUODENOSCOPY (EGD) WITH PROPOFOL;  Surgeon: Clarene Essex, MD;  Location: [facility];  Service: Endoscopy;  Laterality: N/A;   ESOPHAGOGASTRODUODENOSCOPY (EGD) WITH PROPOFOL N/A 08/10/2020   Procedure:  ESOPHAGOGASTRODUODENOSCOPY (EGD) WITH PROPOFOL;  Surgeon: Doran Stabler, MD;  Location: [facility];  Service: Gastroenterology;  Laterality: N/A;   INSERTION OF DIALYSIS CATHETER N/A 03/20/2018   Procedure: INSERTION OF TUNNELED DIALYSIS CATHETER - RIGHT INTERANL JUGULAR PLACEMENT;  Surgeon: Angelia Mould, MD;  Location: [facility];  Service: Vascular;  Laterality: N/A;   IR FLUORO GUIDE CV LINE RIGHT  04/18/2020   IR GUIDED DRAIN W CATHETER PLACEMENT  10/28/2018   KIDNEY TRANSPLANT  2007   KIDNEY TRANSPLANT Right    PARATHYROIDECTOMY  ?2012   "3/4 removed" (10/24/2017)   RENAL BIOPSY Bilateral 2003   REVISON OF ARTERIOVENOUS FISTULA Left 09/24/2020   Procedure: LEFT UPPER ARM ARTERIOVENOUS GRAFT CREATION;  Surgeon: [name], MD;  Location: [facility];  Service: Vascular;  Laterality: Left;   UPPER EXTREMITY VENOGRAPHY Bilateral 10/19/2018   Procedure: UPPER EXTREMITY VENOGRAPHY;  Surgeon: [name], MD;  Location: [facility];  Service: Cardiovascular;  Laterality: Bilateral;  Bilateral    UPPER EXTREMITY VENOGRAPHY Left 09/04/2020   Procedure: UPPER EXTREMITY VENOGRAPHY - Left Upper;  Surgeon: [name], MD;  Location: [place] [facility];  Service: Cardiovascular;  Laterality: Left;    reports that she has never smoked. She has never been exposed to tobacco smoke. She has never used smokeless tobacco. She reports that she does not currently use alcohol. She reports that she does not use drugs. family history includes Aneurysm in her mother; Arthritis in her mother; CAD in her father; Diabetes in her father; Early death in her brother; Hypertension in her mother. Allergies  Allergen Reactions   Diphenhydramine Anaphylaxis   Doxycycline Shortness Of Breath   Motrin [Ibuprofen] Shortness Of Breath and Itching   Contrast Media [Iodinated Diagnostic Agents] Itching   Shellfish Allergy Hives   Banana Itching, Nausea And Vomiting and Other (See Comments)     Sick on the stomach   Chlorhexidine Itching   Ferrous Sulfate Itching   Iron Dextran Itching and Other (See Comments)    Vein irritation       Outpatient Encounter Medications as of 12/23/2020  Medication Sig   ACCU-CHEK SOFTCLIX LANCETS lancets Use to check blood sugar 4 times per day.   acetaminophen (TYLENOL) 500 MG tablet Take 1,000 mg by mouth 2 (two) times daily.   BD PEN NEEDLE NANO 2ND GEN 32G X 4 MM MISC 4 (four) times daily. as directed   calcium acetate (PHOSLO) 667 MG capsule Take 1,334 mg by mouth 3 (three) times daily.   Calcium Carbonate Antacid (CALCIUM CARBONATE, DOSED IN MG ELEMENTAL CALCIUM,) 1250 MG/5ML SUSP Take 500 mg of elemental calcium by mouth daily.   cetirizine (ZYRTEC) 10 MG tablet Take 10 mg by mouth daily as needed for allergies.   famotidine (PEPCID) 40 MG tablet Take 40 mg by mouth at bedtime.   fluticasone (FLONASE) 50 MCG/ACT nasal spray Place 2 sprays into both nostrils daily as needed for allergies.  gabapentin (NEURONTIN) 300 MG capsule Take 300 mg by mouth at bedtime.   hydrOXYzine (ATARAX/VISTARIL) 50 MG tablet Take 50 mg by mouth 2 (two) times daily as needed.   insulin aspart (NOVOLOG) 100 UNIT/ML FlexPen Inject 2-10 Units into the skin 3 (three) times daily with meals. Sliding scale   insulin degludec (TRESIBA) 100 UNIT/ML FlexTouch Pen Inject 20 Units into the skin daily.   Insulin Pen Needle (BD PEN NEEDLE NANO 2ND GEN) 32G X 4 MM MISC 1 each by Does not apply route in the morning, at noon, in the evening, and at bedtime.   Insulin Pen Needle 32G X 4 MM MISC Use as directed   lidocaine (XYLOCAINE) 2 % solution Use as directed 15 mLs in the mouth or throat every 6 (six) hours as needed for mouth pain.   loperamide (IMODIUM) 1 MG/5ML solution Take 2 mg by mouth 2 (two) times daily as needed for diarrhea or loose stools.    loperamide (IMODIUM) 2 MG capsule Take 2 mg by mouth 2 (two) times daily as needed.   metoprolol tartrate (LOPRESSOR) 25 MG  tablet Take 1 tablet (25 mg total) by mouth 2 (two) times daily.   midodrine (PROAMATINE) 10 MG tablet Take by mouth.   Multiple Vitamins-Minerals (EQ MULTIVITAMINS ADULT GUMMY PO) Take 2 tablets by mouth daily.   NOVOLOG FLEXPEN 100 UNIT/ML FlexPen Inject into the skin.   ondansetron (ZOFRAN-ODT) 4 MG disintegrating tablet Take 4 mg by mouth every 8 (eight) hours as needed for nausea/vomiting.   ondansetron (ZOFRAN-ODT) 4 MG disintegrating tablet Take 4 mg by mouth every 8 (eight) hours as needed.   oxyCODONE-acetaminophen (PERCOCET/ROXICET) 5-325 MG tablet Take 1 tablet by mouth every 6 (six) hours as needed.   pantoprazole (PROTONIX) 40 MG tablet Take 40 mg by mouth 2 (two) times daily.   [DISCONTINUED] calcium acetate (PHOSLO) 667 MG capsule Take 1,334 mg by mouth 2 (two) times daily.   [DISCONTINUED] Calcium Carbonate Antacid (CALCIUM CARBONATE, DOSED IN MG ELEMENTAL CALCIUM,) 1250 MG/5ML SUSP Take 500 mg of elemental calcium by mouth daily. 5 ml (Patient not taking: Reported on 12/23/2020)   [DISCONTINUED] famotidine (PEPCID) 40 MG tablet Take 40 mg by mouth at bedtime. (Patient not taking: Reported on 12/23/2020)   [DISCONTINUED] gabapentin (NEURONTIN) 300 MG capsule Take 300 mg by mouth 3 (three) times daily. (Patient not taking: Reported on 12/23/2020)   [DISCONTINUED] hydrOXYzine (ATARAX/VISTARIL) 50 MG tablet Take 1 tablet (50 mg total) by mouth 2 (two) times daily as needed for itching. (Patient not taking: Reported on 12/23/2020)   [DISCONTINUED] oxyCODONE-acetaminophen (PERCOCET) 5-325 MG tablet Take 1 tablet by mouth every 6 (six) hours as needed. (Patient not taking: Reported on 12/23/2020)   [DISCONTINUED] pantoprazole (PROTONIX) 40 MG tablet Take 1 tablet (40 mg total) by mouth 2 (two) times daily.   No facility-administered encounter medications on file as of 12/23/2020.     REVIEW OF SYSTEMS  : All other systems reviewed and negative except where noted in the History of  Present Illness.   PHYSICAL EXAM: BP (!) 136/92   Pulse 90   Ht '5\' 6"'$  (1.676 m)   Wt 171 lb (77.6 kg)   SpO2 96%   BMI 27.60 kg/m  General: Well developed white female in no acute distress Head: Normocephalic and atraumatic Eyes:  Sclerae anicteric, conjunctiva pink. Ears: Normal auditory acuity Lungs: Clear throughout to auscultation; no W/R/R. Heart: Regular rate and rhythm; no M/R/G. Abdomen: Soft, non-distended.  BS present.  Non-tender.  Musculoskeletal: Symmetrical with no gross deformities  Skin: No lesions on visible extremities Extremities: No edema  Neurological: Alert oriented x 4, grossly non-focal Psychological:  Alert and cooperative. Normal mood and affect  ASSESSMENT AND PLAN: *Longstanding abdominal pain, nausea, vomiting in patient with diabetic gastroparesis, GERD.  Recurrent hematemesis.  EGD 07/2020 showing severe esophagitis.  Now describing dysphagia, almost like stasis in her esophagus.  This is probably due to the underlying esophagitis causing some esophageal narrowing, but may have a stricture.  She tries to take her pantoprazole twice a day, when she can keep it down.  She says she is taking Reglan usually 2-3 times a day, unsure what dosing it is.  For now I will prescribe Reglan 10 mg for her to be taking before meals and at bedtime.  Prescription sent to pharmacy. We will plan for repeat EGD to reassess findings.  Seems like she did not have much insight into actually what is going on that is causing her symptoms and her issues.  I spent a lot of time trying to explain her situation.  She may need referred to Dr. Derrill Kay at [facility] (gastroparesis/motility clinic).  *IDDM.  Presenting glucose at August hospitalization was 650.  Obviously needs better blood sugar control.  *ESRD.  Hemodialysis on MWF.  Failed renal transplant from 2007 after lasting 14 years.  CC:  [name], MD

## 2020-12-23 NOTE — Patient Instructions (Signed)
If you are age 37 or younger, your body mass index should be between 19-25. Your Body mass index is 27.6 kg/m. If this is out of the aformentioned range listed, please consider follow up with your Primary Care Provider.  __________________________________________________________  The [facility] providers would like to encourage you to use [facility] to communicate with providers for non-urgent requests or questions.  Due to long hold times on the telephone, sending your provider a message by [facility] may be a faster and more efficient way to get a response.  Please allow 48 business hours for a response.  Please remember that this is for non-urgent requests.   You have been scheduled for an endoscopy. Please follow written instructions given to you at your visit today. If you use inhalers (even only as needed), please bring them with you on the day of your procedure.  START Reglan 1 tablet three times daily and at bedtime for nausea.  Follow up pending the results of your Endoscopy.  Thank you for entrusting me with your care and choosing [facility].  Alonza Bogus, PA-C

## 2020-12-30 ENCOUNTER — Encounter (HOSPITAL_COMMUNITY): Payer: Self-pay | Admitting: *Deleted

## 2021-01-06 ENCOUNTER — Other Ambulatory Visit: Payer: Self-pay

## 2021-01-06 ENCOUNTER — Encounter: Payer: Self-pay | Admitting: Internal Medicine

## 2021-01-06 ENCOUNTER — Ambulatory Visit (AMBULATORY_SURGERY_CENTER): Payer: 59 | Admitting: Internal Medicine

## 2021-01-06 VITALS — BP 135/79 | HR 89 | Temp 96.0°F | Resp 12 | Ht 66.0 in | Wt 171.0 lb

## 2021-01-06 DIAGNOSIS — K2101 Gastro-esophageal reflux disease with esophagitis, with bleeding: Secondary | ICD-10-CM

## 2021-01-06 DIAGNOSIS — K222 Esophageal obstruction: Secondary | ICD-10-CM | POA: Diagnosis not present

## 2021-01-06 DIAGNOSIS — K208 Other esophagitis without bleeding: Secondary | ICD-10-CM

## 2021-01-06 DIAGNOSIS — K3184 Gastroparesis: Secondary | ICD-10-CM

## 2021-01-06 MED ORDER — OMEPRAZOLE 40 MG PO CPDR: 40.0000 mg | DELAYED_RELEASE_CAPSULE | Freq: Two times a day (BID) | ORAL | 3 refills | Status: DC

## 2021-01-06 MED ORDER — SODIUM CHLORIDE 0.9 % IV SOLN: 500.0000 mL | Freq: Once | INTRAVENOUS | Status: DC

## 2021-01-06 NOTE — Op Note (Signed)
[facility] Patient Name: [name] Procedure Date: 01/06/2021 11:58 AM MRN: [id number] Endoscopist: Gatha Mayer , MD Age: 35 Referring MD:  Date of Birth: [birth year] Gender: Female Account #: [id number] Procedure:                Upper GI endoscopy Indications:              Dysphagia, Reflux esophagitis, Follow-up of reflux                            esophagitis Medicines:                Monitored Anesthesia Care Procedure:                Pre-Anesthesia Assessment:                           - Prior to the procedure, a History and Physical                            was performed, and patient medications and                            allergies were reviewed. The patient's tolerance of                            previous anesthesia was also reviewed. The risks                            and benefits of the procedure and the sedation                            options and risks were discussed with the patient.                            All questions were answered, and informed consent                            was obtained. Prior Anticoagulants: The patient has                            taken no previous anticoagulant or antiplatelet                            agents. ASA Grade Assessment: III - A patient with                            severe systemic disease. After reviewing the risks                            and benefits, the patient was deemed in                            satisfactory condition to undergo the procedure.  After obtaining informed consent, the endoscope was                            passed under direct vision. Throughout the                            procedure, the patient's blood pressure, pulse, and                            oxygen saturations were monitored continuously. The                            Endoscope was introduced through the mouth, and                            advanced to the middle third of  esophagus. The                            patient tolerated the procedure well. The upper GI                            endoscopy was technically difficult and complex due                            to stricture. Scope In: Scope Out: Findings:                 LA Grade D (one or more mucosal breaks involving at                            least 75% of esophageal circumference) esophagitis                            was found in the middle third of the esophagus.                            Biopsies were taken with a cold forceps for                            histology. Verification of patient identification                            for the specimen was done. Estimated blood loss was                            minimal.                           One benign-appearing, intrinsic severe (stenosis;                            an endoscope cannot pass) stenosis was found in the  mid esophagus. This stenosis measured 8 mm (inner                            diameter). The stenosis was not traversed. Complications:            No immediate complications. Estimated Blood Loss:     Estimated blood loss was minimal. Impression:               - LA Grade D reflux esophagitis. Biopsied.                           - Benign-appearing esophageal stenosis. Recommendation:           - Patient has a contact number available for                            emergencies. The signs and symptoms of potential                            delayed complications were discussed with the                            patient. Return to normal activities tomorrow.                            Written discharge instructions were provided to the                            patient.                           - Dysphagia 3.                           - Continue present medications.                           - Await pathology results.                           - Must be on bid PPI                           once  I see path will determine next steps - could                            be Candida though suspect reflux                           depending upon results next procedure w/ fluoro at                            hospital and may need ba swallow prior as well Gatha Mayer, MD 01/06/2021 12:28:35 PM This report has been signed electronically.

## 2021-01-06 NOTE — Progress Notes (Signed)
To PACU, VSS. Report to [email or web address] 

## 2021-01-06 NOTE — Progress Notes (Signed)
VS- JD 

## 2021-01-06 NOTE — Progress Notes (Signed)
History and Physical Interval Note:  01/06/2021 12:03 PM  [name]  has presented today for endoscopic procedure(s), with the diagnosis of  Encounter Diagnoses  Name Primary?   Gastroesophageal reflux disease with esophagitis and hemorrhage Yes   Gastroparesis    [place] grade D esophagitis   .  The various methods of evaluation and treatment have been discussed with the patient and/or family. After consideration of risks, benefits and other options for treatment, the patient has consented to  the endoscopic procedure(s).   The patient's history has been reviewed, patient examined, no change in status, stable for endoscopic procedure(s).  I have reviewed the patient's chart and labs.  Questions were answered to the patient's satisfaction.    It does sound like she may be having dysphagia  Gatha Mayer, MD, [facility]

## 2021-01-06 NOTE — Progress Notes (Signed)
Called to room to assist during endoscopic procedure.  Patient ID and intended procedure confirmed with present staff. Received instructions for my participation in the procedure from the performing physician.  

## 2021-01-06 NOTE — Patient Instructions (Addendum)
There is a narrowing in the esophagus - looks like inflammation (bad). I took biopsies to understand it better.  Once I get those results we will figure out next steps to try fix it.  For now diet should be foods that are very soft and crumbly only - no hard bread, meats, raw vegetables.  You do need to be taking acid blockers like omeprazole or pantoprazole twice a day.  I appreciate the opportunity to care for you. Gatha Mayer, MD, FACG  YOU HAD AN ENDOSCOPIC PROCEDURE TODAY AT [facility]:   Refer to the procedure report that was given to you for any specific questions about what was found during the examination.  If the procedure report does not answer your questions, please call your gastroenterologist to clarify.  If you requested that your care partner not be given the details of your procedure findings, then the procedure report has been included in a sealed envelope for you to review at your convenience later.  YOU SHOULD EXPECT: Some feelings of bloating in the abdomen. Passage of more gas than usual.  Walking can help get rid of the air that was put into your GI tract during the procedure and reduce the bloating. If you had a lower endoscopy (such as a colonoscopy or flexible sigmoidoscopy) you may notice spotting of blood in your stool or on the toilet paper. If you underwent a bowel prep for your procedure, you may not have a normal bowel movement for a few days.  Please Note:  You might notice some irritation and congestion in your nose or some drainage.  This is from the oxygen used during your procedure.  There is no need for concern and it should clear up in a day or so.  SYMPTOMS TO REPORT IMMEDIATELY:  Following upper endoscopy (EGD)  Vomiting of blood or coffee ground material  New chest pain or pain under the shoulder blades  Painful or persistently difficult swallowing  New shortness of breath  Fever of 100F or higher  Black, tarry-looking  stools  For urgent or emergent issues, a gastroenterologist can be reached at any hour by calling [phone]. Do not use MyChart messaging for urgent concerns.    DIET:  We do recommend a small meal at first, but then you may proceed to your regular diet.  Drink plenty of fluids but you should avoid alcoholic beverages for 24 hours.  ACTIVITY:  You should plan to take it easy for the rest of today and you should NOT DRIVE or use heavy machinery until tomorrow (because of the sedation medicines used during the test).    FOLLOW UP: Our staff will call the number listed on your records 48-72 hours following your procedure to check on you and address any questions or concerns that you may have regarding the information given to you following your procedure. If we do not reach you, we will leave a message.  We will attempt to reach you two times.  During this call, we will ask if you have developed any symptoms of COVID 19. If you develop any symptoms (ie: fever, flu-like symptoms, shortness of breath, cough etc.) before then, please call [phone].  If you test positive for Covid 19 in the 2 weeks post procedure, please call and report this information to [place].    If any biopsies were taken you will be contacted by phone or by letter within the next 1-3 weeks.  Please call us at [phone]  [phone] if you have not heard about the biopsies in 3 weeks.    SIGNATURES/CONFIDENTIALITY: You and/or your care partner have signed paperwork which will be entered into your electronic medical record.  These signatures attest to the fact that that the information above on your After Visit Summary has been reviewed and is understood.  Full responsibility of the confidentiality of this discharge information lies with you and/or your care-partner.

## 2021-01-08 ENCOUNTER — Telehealth: Payer: Self-pay | Admitting: *Deleted

## 2021-01-08 ENCOUNTER — Telehealth: Payer: Self-pay

## 2021-01-08 NOTE — Telephone Encounter (Signed)
First follow up call attempt.  Unable to leave a message. °

## 2021-01-08 NOTE — Telephone Encounter (Signed)
Second follow up call attempt, no answer unable to LM

## 2021-01-14 ENCOUNTER — Other Ambulatory Visit: Payer: Self-pay

## 2021-01-14 ENCOUNTER — Ambulatory Visit (HOSPITAL_COMMUNITY)
Admission: RE | Admit: 2021-01-14 | Discharge: 2021-01-14 | Disposition: A | Discharge status: Home / Self Care | Payer: Medicare Other | Source: Ambulatory Visit | Attending: Internal Medicine | Admitting: Internal Medicine

## 2021-01-14 DIAGNOSIS — R1319 Other dysphagia: Secondary | ICD-10-CM

## 2021-01-14 DIAGNOSIS — K2210 Ulcer of esophagus without bleeding: Secondary | ICD-10-CM

## 2021-01-14 MED ORDER — IOHEXOL 300 MG/ML  SOLN
100.0000 mL | Freq: Once | INTRAMUSCULAR | Status: AC | PRN
  Administered 2021-01-14: 100 mL via ORAL

## 2021-01-19 ENCOUNTER — Encounter (HOSPITAL_COMMUNITY): Payer: Self-pay | Admitting: Internal Medicine

## 2021-01-19 ENCOUNTER — Ambulatory Visit (HOSPITAL_COMMUNITY): Payer: Medicare Other | Admitting: Anesthesiology

## 2021-01-19 ENCOUNTER — Ambulatory Visit (HOSPITAL_COMMUNITY)
Admission: RE | Admit: 2021-01-19 | Discharge: 2021-01-19 | Disposition: A | Discharge status: Home / Self Care | Payer: Medicare Other | Attending: Internal Medicine | Admitting: Internal Medicine

## 2021-01-19 ENCOUNTER — Encounter (HOSPITAL_COMMUNITY)
Admission: RE | Disposition: A | Discharge status: Home / Self Care | Payer: Self-pay | Source: Home / Self Care | Attending: Internal Medicine

## 2021-01-19 ENCOUNTER — Other Ambulatory Visit: Payer: Self-pay

## 2021-01-19 DIAGNOSIS — E109 Type 1 diabetes mellitus without complications: Secondary | ICD-10-CM | POA: Diagnosis not present

## 2021-01-19 DIAGNOSIS — R1310 Dysphagia, unspecified: Secondary | ICD-10-CM | POA: Diagnosis present

## 2021-01-19 DIAGNOSIS — K219 Gastro-esophageal reflux disease without esophagitis: Secondary | ICD-10-CM | POA: Insufficient documentation

## 2021-01-19 DIAGNOSIS — K222 Esophageal obstruction: Secondary | ICD-10-CM | POA: Diagnosis not present

## 2021-01-19 DIAGNOSIS — Z794 Long term (current) use of insulin: Secondary | ICD-10-CM | POA: Diagnosis not present

## 2021-01-19 DIAGNOSIS — R1319 Other dysphagia: Secondary | ICD-10-CM

## 2021-01-19 DIAGNOSIS — I10 Essential (primary) hypertension: Secondary | ICD-10-CM | POA: Insufficient documentation

## 2021-01-19 DIAGNOSIS — K2210 Ulcer of esophagus without bleeding: Secondary | ICD-10-CM

## 2021-01-19 HISTORY — PX: BALLOON DILATION: SHX5330

## 2021-01-19 HISTORY — PX: ESOPHAGOGASTRODUODENOSCOPY (EGD) WITH PROPOFOL: SHX5813

## 2021-01-19 LAB — GLUCOSE, CAPILLARY: Glucose-Capillary: 255 mg/dL — ABNORMAL HIGH (ref 70–99)

## 2021-01-19 SURGERY — ESOPHAGOGASTRODUODENOSCOPY (EGD) WITH PROPOFOL
Anesthesia: Monitor Anesthesia Care

## 2021-01-19 MED ORDER — HYDRALAZINE HCL 20 MG/ML IJ SOLN
10.0000 mg | Freq: Once | INTRAMUSCULAR | Status: AC
  Administered 2021-01-19: 10 mg via INTRAVENOUS

## 2021-01-19 MED ORDER — PROPOFOL 500 MG/50ML IV EMUL
INTRAVENOUS | Status: AC
  Filled 2021-01-19: qty 50

## 2021-01-19 MED ORDER — SODIUM CHLORIDE 0.9 % IV SOLN
INTRAVENOUS | Status: AC | PRN
  Administered 2021-01-19: 1000 mL via INTRAMUSCULAR

## 2021-01-19 MED ORDER — PROPOFOL 500 MG/50ML IV EMUL
INTRAVENOUS | Status: DC | PRN
  Administered 2021-01-19: 155 ug/kg/min via INTRAVENOUS

## 2021-01-19 MED ORDER — INSULIN ASPART 100 UNIT/ML IJ SOLN
8.0000 [IU] | Freq: Once | INTRAMUSCULAR | Status: AC
  Administered 2021-01-19: 8 [IU] via SUBCUTANEOUS

## 2021-01-19 MED ORDER — ONDANSETRON HCL 4 MG/2ML IJ SOLN
INTRAMUSCULAR | Status: DC | PRN
  Administered 2021-01-19: 4 mg via INTRAVENOUS

## 2021-01-19 MED ORDER — PROPOFOL 500 MG/50ML IV EMUL
INTRAVENOUS | Status: DC | PRN
  Administered 2021-01-19: 50 mg via INTRAVENOUS

## 2021-01-19 MED ORDER — HYDRALAZINE HCL 20 MG/ML IJ SOLN
INTRAMUSCULAR | Status: AC
  Filled 2021-01-19: qty 1

## 2021-01-19 MED ORDER — LIDOCAINE HCL (CARDIAC) PF 100 MG/5ML IV SOSY
PREFILLED_SYRINGE | INTRAVENOUS | Status: DC | PRN
  Administered 2021-01-19: 60 mg via INTRAVENOUS

## 2021-01-19 MED ORDER — SODIUM CHLORIDE 0.9 % IV SOLN: INTRAVENOUS | Status: DC

## 2021-01-19 MED ORDER — INSULIN ASPART 100 UNIT/ML IJ SOLN
INTRAMUSCULAR | Status: AC
  Filled 2021-01-19: qty 1

## 2021-01-19 SURGICAL SUPPLY — 14 items

## 2021-01-19 NOTE — Anesthesia Preprocedure Evaluation (Addendum)
Anesthesia Evaluation  Patient identified by MRN, date of birth, ID band Patient awake    Reviewed: Allergy & Precautions, H&P , NPO status , Patient's Chart, lab work & pertinent test results, reviewed documented beta blocker date and time   Airway Mallampati: II  TM Distance: >3 FB Neck ROM: Full    Dental no notable dental hx. (+) Teeth Intact, Dental Advisory Given   Pulmonary neg pulmonary ROS,    Pulmonary exam normal breath sounds clear to auscultation       Cardiovascular hypertension, Pt. on medications and Pt. on home beta blockers  Rhythm:Regular Rate:Normal     Neuro/Psych negative neurological ROS  negative psych ROS   GI/Hepatic Neg liver ROS, hiatal hernia, GERD  Medicated,  Endo/Other  diabetes, Type 1, Insulin Dependent  Renal/GU Renal disease  negative genitourinary   Musculoskeletal   Abdominal   Peds  Hematology  (+) Blood dyscrasia, anemia ,   Anesthesia Other Findings   Reproductive/Obstetrics negative OB ROS                            Anesthesia Physical Anesthesia Plan  ASA: 3  Anesthesia Plan: MAC   Post-op Pain Management:    Induction: Intravenous  PONV Risk Score and Plan: 2 and Propofol infusion and Treatment may vary due to age or medical condition  Airway Management Planned: Simple Face Mask  Additional Equipment:   Intra-op Plan:   Post-operative Plan:   Informed Consent: I have reviewed the patients History and Physical, chart, labs and discussed the procedure including the risks, benefits and alternatives for the proposed anesthesia with the patient or authorized representative who has indicated his/her understanding and acceptance.     Dental advisory given  Plan Discussed with: CRNA  Anesthesia Plan Comments:         Anesthesia Quick Evaluation

## 2021-01-19 NOTE — Interval H&P Note (Signed)
History and Physical Interval Note:  01/19/2021 11:20 AM  [name]  has presented today for surgery, with the diagnosis of DYSPHAGIA, SEVERE ESOPHAGITIS WITH ULCERATION.  The various methods of treatment have been discussed with the patient and family. After consideration of risks, benefits and other options for treatment, the patient has consented to  Procedure(s) with comments: ESOPHAGOGASTRODUODENOSCOPY (EGD) WITH PROPOFOL (N/A) - WITH FLUOROSCOPY AND DILATION as a surgical intervention.  The patient's history has been reviewed, patient examined, no change in status, stable for surgery.  I have reviewed the patient's chart and labs.  Questions were answered to the patient's satisfaction.    Had EGD last week at [facility] - unable to pass through esophageal stricture which was found. Here for dilation using pediatric scope and possible fluoroscopy today.   Silvano Rusk

## 2021-01-19 NOTE — Transfer of Care (Signed)
Immediate Anesthesia Transfer of Care Note  Patient: CHEVELLE COULSON  Procedure(s) Performed: Procedure(s) with comments: ESOPHAGOGASTRODUODENOSCOPY (EGD) WITH PROPOFOL (N/A) - WITH FLUOROSCOPY AND DILATION BALLOON DILATION (N/A)  Patient Location: PACU  Anesthesia Type:MAC  Level of Consciousness:  sedated, patient cooperative and responds to stimulation  Airway & Oxygen Therapy:Patient Spontanous Breathing and Patient connected to face mask oxgen  Post-op Assessment:  Report given to PACU RN and Post -op Vital signs reviewed and stable  Post vital signs:  Reviewed and stable  Last Vitals:  Vitals:   01/19/21 1129 01/19/21 1219  BP: (!) 194/118 (!) 150/67  Pulse: 83 88  Resp: (!) 83 (!) 21  Temp: 36.9 C   SpO2: 161% 096%    Complications: No apparent anesthesia complications

## 2021-01-19 NOTE — Progress Notes (Signed)
Pt waking up from anesthesia. VSS. See flowsheet. CBG-255. Pt states she is feeling gittery.  Dr. Ola Spurr made aware. No new orders continue to monitor. Pt may be discharged at the appropriate time after sedation as long as vital signs remain stable and no new issues.

## 2021-01-19 NOTE — Anesthesia Postprocedure Evaluation (Signed)
Anesthesia Post Note  Patient: [name]  Procedure(s) Performed: ESOPHAGOGASTRODUODENOSCOPY (EGD) WITH PROPOFOL BALLOON DILATION     Patient location during evaluation: Endoscopy Anesthesia Type: MAC Level of consciousness: awake and alert Pain management: pain level controlled Vital Signs Assessment: post-procedure vital signs reviewed and stable Respiratory status: spontaneous breathing, nonlabored ventilation and respiratory function stable Cardiovascular status: stable and blood pressure returned to baseline Postop Assessment: no apparent nausea or vomiting Anesthetic complications: no   No notable events documented.  Last Vitals:  Vitals:   01/19/21 1235 01/19/21 1240  BP:    Pulse: 88 86  Resp: 10 16  Temp:    SpO2: 100% 99%    Last Pain:  Vitals:   01/19/21 1225  TempSrc:   PainSc: 3                  Amaranta Mehl,W. EDMOND

## 2021-01-19 NOTE — Discharge Instructions (Addendum)
I was able to open up the narrow area some today. More work needs to be done. I dilated it to 10 mm and the goal is 18 mm or close to that.  Be sure to stay on the omeprazole.  Follow the minced and moist food diet.  My office will contact you about the next endoscopy daye. I think we can do it at my office next time.  I appreciate the opportunity to care for you. Gatha Mayer, MD, FACG   YOU HAD AN ENDOSCOPIC PROCEDURE TODAY: Refer to the procedure report and other information in the discharge instructions given to you for any specific questions about what was found during the examination. If this information does not answer your questions, please call Dr. Celesta Aver office at [phone] to clarify.   YOU SHOULD EXPECT: Some feelings of bloating in the abdomen. Passage of more gas than usual. Walking can help get rid of the air that was put into your GI tract during the procedure and reduce the bloating. If you had a lower endoscopy (such as a colonoscopy or flexible sigmoidoscopy) you may notice spotting of blood in your stool or on the toilet paper. Some abdominal soreness may be present for a day or two, also.  DIET:  Stay on clear liquids until 2 PM and then try to eat minced and moist foods (see diet sheet).   ACTIVITY: Your care partner should take you home directly after the procedure. You should plan to take it easy, moving slowly for the rest of the day. You can resume normal activity the day after the procedure however YOU SHOULD NOT DRIVE, use power tools, machinery or perform tasks that involve climbing or major physical exertion for 24 hours (because of the sedation medicines used during the test).   SYMPTOMS TO REPORT IMMEDIATELY: A gastroenterologist can be reached at any hour. Please call [phone]  for any of the following symptoms:   Following upper endoscopy (EGD, EUS, ERCP, esophageal dilation) Vomiting of blood or coffee ground material  New, significant  abdominal pain  New, significant chest pain or pain under the shoulder blades  Painful or persistently difficult swallowing  New shortness of breath  Black, tarry-looking or red, bloody stools       [facility] ENDOSCOPY [place], [place]  [address] Phone:  [phone]   January 19, 2021  Patient: [name]  Date of Birth: [birth year]  Date of Visit: January 19, 2021    To Whom It May Concern:  [name] was seen and treated on January 19, 2021  at [facility] unit. She needed a ride home and could not drive herself or stay the rest of the afternoon alone. Sloan Leiter was her ride to and from the procedure and her person at home. Thank you,           If you have any questions or concerns, please don't hesitate to call [phone]   Sincerely,   Tory Emerald     Treatment Team:  Attending Provider: Gatha Mayer, MD

## 2021-01-19 NOTE — Op Note (Signed)
[facility] Patient Name: [name] Procedure Date: 01/19/2021 MRN: [id number] Attending MD: Gatha Mayer , MD Date of Birth: 16-Dec-1985 CSN: [id number] Age: 35 Admit Type: Outpatient Procedure:                Upper GI endoscopy Indications:              Dysphagia, Stricture of the esophagus, For therapy                            of esophageal stricture Providers:                Gatha Mayer, MD, Jaci Carrel, RN, Debi                            Mays, RN, Arnoldo Hooker, CRNA Referring MD:              Medicines:                Propofol per Anesthesia, Monitored Anesthesia Care Complications:            No immediate complications. Estimated Blood Loss:     Estimated blood loss was minimal. Procedure:                Pre-Anesthesia Assessment:                           - Prior to the procedure, a History and Physical                            was performed, and patient medications and                            allergies were reviewed. The patient's tolerance of                            previous anesthesia was also reviewed. The risks                            and benefits of the procedure and the sedation                            options and risks were discussed with the patient.                            All questions were answered, and informed consent                            was obtained. Prior Anticoagulants: The patient has                            taken no previous anticoagulant or antiplatelet                            agents. ASA Grade Assessment: III - A patient with  severe systemic disease. After reviewing the risks                            and benefits, the patient was deemed in                            satisfactory condition to undergo the procedure.                           After obtaining informed consent, the endoscope was                            passed under direct vision. Throughout the                             procedure, the patient's blood pressure, pulse, and                            oxygen saturations were monitored continuously. The                            [id number]-[id number] ([id number]) Olympus slim endoscope was                            introduced through the mouth, and advanced to the                            second part of duodenum. The upper GI endoscopy was                            accomplished without difficulty. The patient                            tolerated the procedure well. Scope In: Scope Out: Findings:      One benign-appearing, intrinsic severe (stenosis; an endoscope cannot       pass) stenosis was found 30 to 34 cm from the incisors. This stenosis       measured 7 mm (inner diameter). The stenosis was traversed with       ultraslim but not the standard scope. A TTS dilator was passed through       the scope. Dilation with a 6-7-8 mm balloon and an 10-21-08 mm balloon       dilator was performed to 10 mm. The dilation site was examined and       showed moderate mucosal disruption and mild improvement in luminal       narrowing. Estimated blood loss was minimal.      A medium amount of food (residue) was found in the gastric body.      The examined duodenum was normal. Impression:               - Benign-appearing esophageal stenosis. Dilated. 10                            mm max - able to pass with ultraslim scope only                           -  A medium amount of food (residue) in the stomach.                           - Normal examined duodenum.                           - No specimens collected. Moderate Sedation:      Not Applicable - Patient had care per Anesthesia. Recommendation:           - Patient has a contact number available for                            emergencies. The signs and symptoms of potential                            delayed complications were discussed with the                            patient. Return to normal activities tomorrow.                             Written discharge instructions were provided to the                            patient.                           - Clear liquids x 1 hour then moved to moist and                            minced foods.                           - Continue present medications. I changed her to                            omeprazole capsules recently and that is working                            better (opening them)                           - I will arrange a repeat dilation in 1-2 weeks -                            try for a Tuesday or a Thursday due to dialysis                            schedule - now that i understand stricture anatomy                            better I think procedure can be done in [place]  setting Procedure Code(s):        --- Professional ---                           [phone], Esophagogastroduodenoscopy, flexible,                            transoral; with transendoscopic balloon dilation of                            esophagus (less than 30 mm diameter) Diagnosis Code(s):        --- Professional ---                           K22.2, Esophageal obstruction                           R13.10, Dysphagia, unspecified CPT copyright 2019 American Medical Association. All rights reserved. The codes documented in this report are preliminary and upon coder review may  be revised to meet current compliance requirements. Gatha Mayer, MD 01/19/2021 12:29:58 PM This report has been signed electronically. Number of Addenda: 0

## 2021-01-20 ENCOUNTER — Encounter (HOSPITAL_COMMUNITY): Payer: Self-pay | Admitting: Internal Medicine

## 2021-01-21 LAB — POCT I-STAT, CHEM 8
BUN: 19 mg/dL (ref 6–20)
Calcium, Ion: 0.99 mmol/L — ABNORMAL LOW (ref 1.15–1.40)
Chloride: 99 mmol/L (ref 98–111)
Creatinine, Ser: 5 mg/dL — ABNORMAL HIGH (ref 0.44–1.00)
Glucose, Bld: 286 mg/dL — ABNORMAL HIGH (ref 70–99)
HCT: 35 % — ABNORMAL LOW (ref 36.0–46.0)
Hemoglobin: 11.9 g/dL — ABNORMAL LOW (ref 12.0–15.0)
Potassium: 4.9 mmol/L (ref 3.5–5.1)
Sodium: 136 mmol/L (ref 135–145)
TCO2: 28 mmol/L (ref 22–32)

## 2021-01-26 ENCOUNTER — Telehealth: Payer: Self-pay | Admitting: Internal Medicine

## 2021-01-26 DIAGNOSIS — R1319 Other dysphagia: Secondary | ICD-10-CM

## 2021-01-26 NOTE — Telephone Encounter (Signed)
Please review and advise if you would like me to proceed with scheduling procedure.

## 2021-01-26 NOTE — Telephone Encounter (Signed)
Patient had a EGD at [facility] on 01/19/21 and Dr. Carlean Purl recommended for her to have another dilation exam 2-3 weeks after. Patient requested a call back to discuss and schedule.

## 2021-01-27 ENCOUNTER — Other Ambulatory Visit: Payer: Self-pay

## 2021-01-27 DIAGNOSIS — R1319 Other dysphagia: Secondary | ICD-10-CM

## 2021-01-27 NOTE — Telephone Encounter (Signed)
Called overbook at [facility] to determine if case can be added. Nursing agreed to add case. Orders placed and pt has been scheduled per Dr. Celesta Aver request @ [facility] on 11/22 @ 1230pm, arrival time 11am. Amb referral placed for auth purposes. Called pt to make her aware of this appt date/time/location. No answer. Phone rings repeatedly, then cuts off and beeps repeatedly. My Chart message sent providing pt with date/time/location and prep instructions for procedure. Will continue efforts to follow up to ensure pt is aware. Will also route this message to Dr. Carlean Purl to make him aware as well.   PJ - routing this message to you as it appears Dr. Carlean Purl is in AM clinic this day with PM [place] to follow.

## 2021-01-27 NOTE — Telephone Encounter (Signed)
Please see if it can be done at [facility] on 11/22.  Explain to patient that had thought we could do at [facility] but due to policy changes dialysis patients will be done at hospital going forward (safety concerns)  If 11/22 not possible I am willing to do early AM 0730 or 0830 in 11/23  Please let me know  My preference is 11/22  She dialyzes MWF so if it is wed she may have to adjust dialysis schedule

## 2021-01-28 NOTE — Telephone Encounter (Signed)
SECOND ATTEMPT:  Reviewed pt chart. Pt has not yet reviewed My Chart message. Called pt to make her aware of procedure date/time/location/arrival time/prep instructions. Pt is agreeable to procedure date/time. Reminded pt to log in to My Chart to view all this information. Advised to call or respond via My Chart if she had any questions or concerns. Verbalized acceptance and understanding.

## 2021-02-02 NOTE — Anesthesia Preprocedure Evaluation (Addendum)
Anesthesia Evaluation  Patient identified by MRN, date of birth, ID band Patient awake    Reviewed: Allergy & Precautions, NPO status , Patient's Chart, lab work & pertinent test results  Airway Mallampati: II  TM Distance: >3 FB Neck ROM: Full    Dental no notable dental hx. (+) Teeth Intact, Dental Advisory Given   Pulmonary shortness of breath,    Pulmonary exam normal breath sounds clear to auscultation       Cardiovascular hypertension, Normal cardiovascular exam Rhythm:Regular Rate:Normal     Neuro/Psych negative neurological ROS     GI/Hepatic hiatal hernia, GERD  ,  Endo/Other  diabetes  Renal/GU      Musculoskeletal   Abdominal (+) + obese,   Peds  Hematology  (+) anemia ,   Anesthesia Other Findings All: Diphenhydramine, oxycycline, Motrin  Reproductive/Obstetrics                            Anesthesia Physical Anesthesia Plan  ASA: 3  Anesthesia Plan: MAC   Post-op Pain Management:    Induction:   PONV Risk Score and Plan: Treatment may vary due to age or medical condition  Airway Management Planned: Natural Airway and Nasal Cannula  Additional Equipment: None  Intra-op Plan:   Post-operative Plan:   Informed Consent: I have reviewed the patients History and Physical, chart, labs and discussed the procedure including the risks, benefits and alternatives for the proposed anesthesia with the patient or authorized representative who has indicated his/her understanding and acceptance.     Dental advisory given  Plan Discussed with: CRNA and Anesthesiologist  Anesthesia Plan Comments:        Anesthesia Quick Evaluation

## 2021-02-03 ENCOUNTER — Ambulatory Visit (HOSPITAL_COMMUNITY)
Admission: RE | Admit: 2021-02-03 | Discharge: 2021-02-03 | Disposition: A | Discharge status: Home / Self Care | Payer: Medicare Other | Attending: Internal Medicine | Admitting: Internal Medicine

## 2021-02-03 ENCOUNTER — Encounter (HOSPITAL_COMMUNITY)
Admission: RE | Disposition: A | Discharge status: Home / Self Care | Payer: Self-pay | Source: Home / Self Care | Attending: Internal Medicine

## 2021-02-03 ENCOUNTER — Encounter (HOSPITAL_COMMUNITY): Payer: Self-pay | Admitting: Internal Medicine

## 2021-02-03 ENCOUNTER — Ambulatory Visit (HOSPITAL_COMMUNITY): Payer: Medicare Other | Admitting: Anesthesiology

## 2021-02-03 ENCOUNTER — Other Ambulatory Visit: Payer: Self-pay

## 2021-02-03 DIAGNOSIS — R1310 Dysphagia, unspecified: Secondary | ICD-10-CM | POA: Diagnosis present

## 2021-02-03 DIAGNOSIS — E1122 Type 2 diabetes mellitus with diabetic chronic kidney disease: Secondary | ICD-10-CM | POA: Diagnosis not present

## 2021-02-03 DIAGNOSIS — E669 Obesity, unspecified: Secondary | ICD-10-CM | POA: Diagnosis not present

## 2021-02-03 DIAGNOSIS — Z940 Kidney transplant status: Secondary | ICD-10-CM | POA: Insufficient documentation

## 2021-02-03 DIAGNOSIS — K3184 Gastroparesis: Secondary | ICD-10-CM | POA: Insufficient documentation

## 2021-02-03 DIAGNOSIS — N186 End stage renal disease: Secondary | ICD-10-CM | POA: Diagnosis not present

## 2021-02-03 DIAGNOSIS — D631 Anemia in chronic kidney disease: Secondary | ICD-10-CM | POA: Insufficient documentation

## 2021-02-03 DIAGNOSIS — I120 Hypertensive chronic kidney disease with stage 5 chronic kidney disease or end stage renal disease: Secondary | ICD-10-CM | POA: Diagnosis not present

## 2021-02-03 DIAGNOSIS — K222 Esophageal obstruction: Secondary | ICD-10-CM | POA: Diagnosis not present

## 2021-02-03 DIAGNOSIS — E1143 Type 2 diabetes mellitus with diabetic autonomic (poly)neuropathy: Secondary | ICD-10-CM | POA: Diagnosis not present

## 2021-02-03 DIAGNOSIS — K219 Gastro-esophageal reflux disease without esophagitis: Secondary | ICD-10-CM | POA: Diagnosis not present

## 2021-02-03 DIAGNOSIS — Z6827 Body mass index (BMI) 27.0-27.9, adult: Secondary | ICD-10-CM | POA: Insufficient documentation

## 2021-02-03 DIAGNOSIS — T18128A Food in esophagus causing other injury, initial encounter: Secondary | ICD-10-CM

## 2021-02-03 DIAGNOSIS — R1319 Other dysphagia: Secondary | ICD-10-CM

## 2021-02-03 HISTORY — PX: ESOPHAGOGASTRODUODENOSCOPY (EGD) WITH PROPOFOL: SHX5813

## 2021-02-03 HISTORY — PX: FOREIGN BODY REMOVAL: SHX962

## 2021-02-03 LAB — GLUCOSE, CAPILLARY: Glucose-Capillary: 129 mg/dL — ABNORMAL HIGH (ref 70–99)

## 2021-02-03 SURGERY — ESOPHAGOGASTRODUODENOSCOPY (EGD) WITH PROPOFOL
Anesthesia: Monitor Anesthesia Care

## 2021-02-03 MED ORDER — PHENYLEPHRINE 40 MCG/ML (10ML) SYRINGE FOR IV PUSH (FOR BLOOD PRESSURE SUPPORT)
PREFILLED_SYRINGE | INTRAVENOUS | Status: DC | PRN
  Administered 2021-02-03 (×2): 80 ug via INTRAVENOUS

## 2021-02-03 MED ORDER — DEXMEDETOMIDINE (PRECEDEX) IN NS 20 MCG/5ML (4 MCG/ML) IV SYRINGE
PREFILLED_SYRINGE | INTRAVENOUS | Status: DC | PRN
  Administered 2021-02-03 (×2): 8 ug via INTRAVENOUS

## 2021-02-03 MED ORDER — PROPOFOL 500 MG/50ML IV EMUL
INTRAVENOUS | Status: DC | PRN
  Administered 2021-02-03: 150 ug/kg/min via INTRAVENOUS

## 2021-02-03 MED ORDER — SODIUM CHLORIDE 0.9 % IV SOLN: INTRAVENOUS | Status: DC

## 2021-02-03 MED ORDER — LIDOCAINE HCL (CARDIAC) PF 100 MG/5ML IV SOSY
PREFILLED_SYRINGE | INTRAVENOUS | Status: DC | PRN
  Administered 2021-02-03: 60 mg via INTRAVENOUS

## 2021-02-03 SURGICAL SUPPLY — 14 items

## 2021-02-03 NOTE — Discharge Instructions (Addendum)
I dilated the esophagus more today last time was 10 mm and now it is 12 mm.  Please follow the diet I am attaching.  You had a piece of pineapple in the esophagus - I had to chop it up and push it into stomach. Please do not eat hard things like that yet.  Gatha Mayer, MD, FACG   YOU HAD AN ENDOSCOPIC PROCEDURE TODAY: Refer to the procedure report and other information in the discharge instructions given to you for any specific questions about what was found during the examination. If this information does not answer your questions, please call [place] office at [phone] to clarify.   YOU SHOULD EXPECT: Some feelings of bloating in the abdomen. Passage of more gas than usual. Walking can help get rid of the air that was put into your GI tract during the procedure and reduce the bloating. If you had a lower endoscopy (such as a colonoscopy or flexible sigmoidoscopy) you may notice spotting of blood in your stool or on the toilet paper. Some abdominal soreness may be present for a day or two, also.  DIET: Clear liquids only until 2 PM then try very soft foods and then bite sized foods tomorrow.  ACTIVITY: Your care partner should take you home directly after the procedure. You should plan to take it easy, moving slowly for the rest of the day. You can resume normal activity the day after the procedure however YOU SHOULD NOT DRIVE, use power tools, machinery or perform tasks that involve climbing or major physical exertion for 24 hours (because of the sedation medicines used during the test).   SYMPTOMS TO REPORT IMMEDIATELY: A gastroenterologist can be reached at any hour. Please call [phone]  for any of the following symptoms:   Following upper endoscopy (EGD, EUS, ERCP, esophageal dilation) Vomiting of blood or coffee ground material  New, significant abdominal pain  New, significant chest pain or pain under the shoulder blades  Painful or persistently difficult swallowing  New  shortness of breath  Black, tarry-looking or red, bloody stools  FOLLOW UP:  If any biopsies were taken you will be contacted by phone or by letter within the next 1-3 weeks. Call [phone]  if you have not heard about the biopsies in 3 weeks.  Please also call with any specific questions about appointments or follow up tests.      [facility] ENDOSCOPY [address] [place], [place]  [address] Phone:  [phone]   February 03, 2021  Patient: [name]  Date of Birth: Mar 24, 1985  Date of Visit: February 03, 2021    To Whom It May Concern:  [name] was seen and treated on February 03, 2021 and.  [name] was her driver for the procedure.       If you have any questions or concerns, please don't hesitate to call [phone].   Sincerely,       Treatment Team:  Attending Provider: Gatha Mayer, MD

## 2021-02-03 NOTE — Transfer of Care (Signed)
Immediate Anesthesia Transfer of Care Note  Patient: [name]  Procedure(s) Performed: ESOPHAGOGASTRODUODENOSCOPY (EGD) WITH PROPOFOL Balloon dilation wire-guided FOREIGN BODY REMOVAL  Patient Location: Endoscopy Unit  Anesthesia Type:MAC  Level of Consciousness: awake  Airway & Oxygen Therapy: Patient Spontanous Breathing and Patient connected to face mask oxygen  Post-op Assessment: Report given to RN and Post -op Vital signs reviewed and stable  Post vital signs: Reviewed and stable  Last Vitals:  Vitals Value Taken Time  BP 69/33 02/03/21 1305  Temp    Pulse 83 02/03/21 1307  Resp 15 02/03/21 1307  SpO2 96 % 02/03/21 1307  Vitals shown include unvalidated device data.  Last Pain:  Vitals:   02/03/21 1200  TempSrc: Oral  PainSc: 0-No pain         Complications: No notable events documented.

## 2021-02-03 NOTE — Anesthesia Procedure Notes (Signed)
Procedure Name: MAC Date/Time: 02/03/2021 12:55 PM Performed by: Lieutenant Diego, CRNA Pre-anesthesia Checklist: Patient identified, Emergency Drugs available, Suction available, Patient being monitored and Timeout performed Patient Re-evaluated:Patient Re-evaluated prior to induction Oxygen Delivery Method: Simple face mask Preoxygenation: Pre-oxygenation with 100% oxygen Induction Type: IV induction

## 2021-02-03 NOTE — Anesthesia Postprocedure Evaluation (Signed)
Anesthesia Post Note  Patient: VENNESA BASTEDO  Procedure(s) Performed: ESOPHAGOGASTRODUODENOSCOPY (EGD) WITH PROPOFOL Balloon dilation wire-guided FOREIGN BODY REMOVAL     Patient location during evaluation: Endoscopy Anesthesia Type: MAC Level of consciousness: awake and alert Pain management: pain level controlled Vital Signs Assessment: post-procedure vital signs reviewed and stable Respiratory status: spontaneous breathing, nonlabored ventilation, respiratory function stable and patient connected to nasal cannula oxygen Cardiovascular status: blood pressure returned to baseline and stable Postop Assessment: no apparent nausea or vomiting Anesthetic complications: no   No notable events documented.  Last Vitals:  Vitals:   02/03/21 1326 02/03/21 1330  BP: 103/72 (!) 87/66  Pulse: 80 77  Resp: 13 16  Temp:    SpO2: 98% 96%    Last Pain:  Vitals:   02/03/21 1330  TempSrc:   PainSc: 0-No pain                 Barnet Glasgow

## 2021-02-03 NOTE — Progress Notes (Signed)
April crna notified - low bp treated

## 2021-02-03 NOTE — H&P (Signed)
[facility] History and Physical   Primary Care Physician:  [name], MD   Reason for Procedure:   Dilate esophageal stricture  Plan:    Egd and esophageal dilation     HPI: [name] is a 35 y.o. female w/ DM, gastroparesis, FGERD and ESRD on HD here for repeat dilation of severe GERD-related esophageal stricture    Past Medical History:  Diagnosis Date   Anemia    Blood transfusion without reported diagnosis    Chronic kidney disease    kidney transplant 07   Diabetes mellitus    Pt reports diagnosis in June 2011, Type 2   Diabetes mellitus without complication ([place])    GERD (gastroesophageal reflux disease)    Hyperlipidemia    Hypertension    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   LEARNING DISABILITY 09/25/2007   Qualifier: Diagnosis of  By: Deborra Medina MD, Talia     Pseudoseizures ([place]) 12/22/2012   Pyelonephritis 06/23/2014   Renal disorder    Seasonal allergies    Seizures ([place])    UTI (urinary tract infection) 01/09/2015   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   Qualifier: Diagnosis of  By: Carlena Sax  MD, Colletta [place]      Past Surgical History:  Procedure Laterality Date   ARTERIOVENOUS GRAFT PLACEMENT Bilateral    "neither work" (10/24/2017)   AV FISTULA PLACEMENT Left 10/26/2018   Procedure: CREATION OF ARTERIOVENOUS FISTULA  LEFT ARM;  Surgeon: [name], MD;  Location: [place];  Service: Vascular;  Laterality: Left;   AV FISTULA PLACEMENT Left 05/23/2020   Procedure: LEFT ARM ARTERIOVENOUS (AV) FISTULA CREATION;  Surgeon: [name], MD;  Location: [place];  Service: Vascular;  Laterality: Left;   BALLOON DILATION N/A 01/19/2021   Procedure: Larrie Kass DILATION;  Surgeon: Gatha Mayer, MD;  Location: [facility];  Service: Endoscopy;  Laterality: N/A;   BASCILIC VEIN TRANSPOSITION Left 12/21/2018   Procedure: Left arm BASILIC VEIN TRANSPOSITION SECOND STAGE;  Surgeon: [name], MD;  Location: [place];  Service:  Vascular;  Laterality: Left;   CHOLECYSTECTOMY N/A 06/30/2017   Procedure: LAPAROSCOPIC CHOLECYSTECTOMY WITH INTRAOPERATIVE CHOLANGIOGRAM;  Surgeon: Excell Seltzer, MD;  Location: [facility];  Service: General;  Laterality: N/A;   ESOPHAGOGASTRODUODENOSCOPY (EGD) WITH PROPOFOL N/A 07/04/2017   Procedure: ESOPHAGOGASTRODUODENOSCOPY (EGD) WITH PROPOFOL;  Surgeon: Clarene Essex, MD;  [facility]: [facility];  Service: Endoscopy;  Laterality: N/A;   ESOPHAGOGASTRODUODENOSCOPY (EGD) WITH PROPOFOL N/A 08/10/2020   Procedure: ESOPHAGOGASTRODUODENOSCOPY (EGD) WITH PROPOFOL;  Surgeon: Doran Stabler, MD;  Location: [place];  Service: Gastroenterology;  Laterality: N/A;   ESOPHAGOGASTRODUODENOSCOPY (EGD) WITH PROPOFOL N/A 01/19/2021   Procedure: ESOPHAGOGASTRODUODENOSCOPY (EGD) WITH PROPOFOL;  Surgeon: Gatha Mayer, MD;  Location: [facility];  Service: Endoscopy;  Laterality: N/A;  WITH FLUOROSCOPY AND DILATION   INSERTION OF DIALYSIS CATHETER N/A 03/20/2018   Procedure: INSERTION OF TUNNELED DIALYSIS CATHETER - RIGHT INTERANL JUGULAR PLACEMENT;  Surgeon: Angelia Mould, MD;  Location: [place];  Service: Vascular;  Laterality: N/A;   IR FLUORO GUIDE CV LINE RIGHT  04/18/2020   IR GUIDED DRAIN W CATHETER PLACEMENT  10/28/2018   KIDNEY TRANSPLANT  2007   KIDNEY TRANSPLANT Right    PARATHYROIDECTOMY  ?2012   "3/4 removed" (10/24/2017)   RENAL BIOPSY Bilateral 2003   REVISON OF ARTERIOVENOUS FISTULA Left 09/24/2020   Procedure: LEFT UPPER ARM ARTERIOVENOUS GRAFT CREATION;  Surgeon: [name], MD;  Location: [place];  Service: Vascular;  Laterality:  Left;   UPPER EXTREMITY VENOGRAPHY Bilateral 10/19/2018   Procedure: UPPER EXTREMITY VENOGRAPHY;  Surgeon: [name], MD;  Location: [place] [facility];  Service: Cardiovascular;  Laterality: Bilateral;  Bilateral    UPPER EXTREMITY VENOGRAPHY Left 09/04/2020   Procedure: UPPER EXTREMITY VENOGRAPHY - Left Upper;  Surgeon: [name], MD;  Location: [place] [facility];  Service: Cardiovascular;  Laterality: Left;    Prior to Admission medications   Medication Sig Start Date End Date Taking? Authorizing Provider  acetaminophen (TYLENOL) 500 MG tablet Take 500-1,000 mg by mouth every 6 (six) hours as needed for moderate pain.   Yes [provider]  calcium acetate (PHOSLO) 667 MG capsule Take 1,334 mg by mouth 3 (three) times daily with meals. 10/26/20  Yes [provider]  Calcium Carbonate Antacid (CALCIUM CARBONATE, DOSED IN MG ELEMENTAL CALCIUM,) 1250 MG/5ML SUSP Take 500 mg of elemental calcium by mouth daily. 10/22/20  Yes [provider]  cetirizine (ZYRTEC) 10 MG tablet Take 10 mg by mouth daily.   Yes [provider]  famotidine (PEPCID) 40 MG tablet Take 40 mg by mouth at bedtime. 06/26/20  Yes [provider]  fluticasone (FLONASE) 50 MCG/ACT nasal spray Place 2 sprays into both nostrils daily as needed for allergies. 09/03/18  Yes [name], MD  gabapentin (NEURONTIN) 400 MG capsule Take 800-1,200 mg by mouth at bedtime. 12/30/20  Yes [provider]  hydrOXYzine (ATARAX/VISTARIL) 50 MG tablet Take 50 mg by mouth 2 (two) times daily as needed for anxiety. 11/03/20  Yes [provider]  insulin aspart (NOVOLOG FLEXPEN) 100 UNIT/ML FlexPen Inject 0-15 Units into the skin in the morning, at noon, in the evening, and at bedtime.   Yes [provider]  insulin degludec (TRESIBA) 100 UNIT/ML FlexTouch Pen Inject 20 Units into the skin daily. Patient taking differently: Inject 20 Units into the skin daily. 02/15/20  Yes Dwyane Dee, MD  lidocaine (XYLOCAINE) 2 % solution Use as directed 15 mLs in the mouth or throat every 6 (six) hours as needed for mouth pain. 11/11/20  Yes Cardama, Grayce Sessions, MD  lidocaine-prilocaine (EMLA) cream Apply 1 application topically daily as needed [facility]). 12/17/20  Yes [provider]  loperamide  (IMODIUM) 2 MG capsule Take 2 mg by mouth as needed for diarrhea or loose stools. 09/11/20  Yes [provider]  metoCLOPramide (REGLAN) 10 MG tablet Take 1 tablet (10 mg total) by mouth 4 (four) times daily -  before meals and at bedtime. Patient taking differently: Take 10 mg by mouth in the morning and at bedtime. 12/23/20  Yes Zehr, Laban Emperor, PA-C  metoprolol tartrate (LOPRESSOR) 25 MG tablet Take 1 tablet (25 mg total) by mouth 2 (two) times daily. Patient taking differently: Take 25 mg by mouth in the morning. 02/15/20  Yes Dwyane Dee, MD  omeprazole (PRILOSEC) 40 MG capsule Take 1 capsule (40 mg total) by mouth 2 (two) times daily before a meal. Open capsule and place granules in water or applesauce to swallow 01/06/21  Yes Gatha Mayer, MD  ondansetron (ZOFRAN-ODT) 4 MG disintegrating tablet Take 4 mg by mouth every 8 (eight) hours as needed for vomiting or nausea. 11/04/20  Yes [provider]  simvastatin (ZOCOR) 20 MG tablet Take 20 mg by mouth at bedtime.   Yes [provider]  ACCU-CHEK GUIDE test strip USE TO TEST THREE TIMES DAILY FOR GLUCOSE TESTING 12/30/20   [provider]  Ermalene Postin  LANCETS lancets Use to check blood sugar 4 times per day. 12/29/15   Renato Shin, MD  BD PEN NEEDLE NANO 2ND GEN 32G X 4 MM MISC 4 (four) times daily. as directed 08/13/20   [provider]  Insulin Pen Needle (BD PEN NEEDLE NANO 2ND GEN) 32G X 4 MM MISC 1 each by Does not apply route in the morning, at noon, in the evening, and at bedtime. 08/10/20   Barb Merino, MD  Insulin Pen Needle 32G X 4 MM MISC Use as directed 11/06/20   Shelly Coss, MD    Current Facility-Administered Medications  Medication Dose Route Frequency Provider Last Rate Last Admin   0.9 %  sodium chloride infusion   Intravenous Continuous Gatha Mayer, MD 20 mL/hr at 02/03/21 1213 New Bag at 02/03/21 1213    Allergies as of 01/27/2021 - Review Complete 01/19/2021   Allergen Reaction Noted   Diphenhydramine Anaphylaxis 12/27/2019   Doxycycline Shortness Of Breath 11/20/2015   Motrin [ibuprofen] Shortness Of Breath and Itching 09/17/2010   Contrast media [iodinated diagnostic agents] Itching 09/04/2020   Shellfish allergy Hives 12/22/2012   Banana Itching, Nausea And Vomiting, and Other (See Comments) 12/22/2012   Chlorhexidine Itching 06/15/2019   Ferrous sulfate Itching 07/19/2018   Iron dextran Itching and Other (See Comments) 02/24/2010    Family History  Problem Relation Age of Onset   Arthritis Mother    Hypertension Mother    Aneurysm Mother        died of brain aneurysm   CAD Father        Has 3 stents   Diabetes Father        borderline   Early death Brother        Died in war   Colon cancer Neg Hx    Esophageal cancer Neg Hx    Rectal cancer Neg Hx    Stomach cancer Neg Hx     Social History   Socioeconomic History   Marital status: Single    Spouse name: Not on file   Number of children: Not on file   Years of education: Not on file   Highest education level: Not on file  Occupational History   Occupation: Scientist, 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  Other Topics Concern   Not on file  Social History Narrative   ** Merged History Encounter **       Patient reports that she is single,  Right Handed Drinks caffeine rarely   Social Determinants of Radio broadcast assistant Strain: Not on file  Food Insecurity: Not on file  Transportation Needs: Not on file  Physical Activity: Not on file  Stress: Not on file  Social Connections: Not on file  Intimate Partner Violence: Not on file    Review of Systems:  All other review  of systems negative except as mentioned in the HPI.  Physical Exam: Vital signs BP 92/69   Pulse 95   Temp 98.1 F (36.7 C) (Oral)   Resp 16   Ht 5\' 6"  (1.676 m)   Wt 77.6 kg   SpO2 99%  BMI 27.60 kg/m   General:   Alert,  Well-developed, well-nourished, pleasant and cooperative in NAD Lungs:  Clear throughout to auscultation.   Heart:  Regular rate and rhythm; no murmurs, clicks, rubs,  or gallops. Abdomen:  Soft, nontender and nondistended. Normal bowel sounds.   Neuro/Psych:  Alert and cooperative. Normal mood and affect. A and O x 3   @Teyanna Thielman  Simonne Maffucci, MD, [facility] [phone] (pager) 02/03/2021 12:32 PM@

## 2021-02-03 NOTE — Progress Notes (Signed)
Pt c/o of small bump inside mouth on lip on right side.  I did see this bump.  This is most likely from the bite block.

## 2021-02-03 NOTE — Op Note (Signed)
[facility] Patient Name: [name] Procedure Date: 02/03/2021 MRN: [id number] Attending MD: Gatha Mayer , MD Date of Birth: 10-28-1985 CSN: [id number] Age: 35 Admit Type: Outpatient Procedure:                Upper GI endoscopy Indications:              Dysphagia, Stricture of the esophagus, For therapy                            of esophageal stricture Providers:                Gatha Mayer, MD, Mikey College, RN, Frazier Richards, Technician Referring MD:              Medicines:                Propofol per Anesthesia, Monitored Anesthesia Care Complications:            No immediate complications. Estimated Blood Loss:     Estimated blood loss was minimal. Procedure:                Pre-Anesthesia Assessment:                           - Prior to the procedure, a History and Physical                            was performed, and patient medications and                            allergies were reviewed. The patient's tolerance of                            previous anesthesia was also reviewed. The risks                            and benefits of the procedure and the sedation                            options and risks were discussed with the patient.                            All questions were answered, and informed consent                            was obtained. Prior Anticoagulants: The patient has                            taken no previous anticoagulant or antiplatelet                            agents. ASA Grade Assessment: III - A patient with  severe systemic disease. After reviewing the risks                            and benefits, the patient was deemed in                            satisfactory condition to undergo the procedure.                           After obtaining informed consent, the endoscope was                            passed under direct vision. Throughout the                             procedure, the patient's blood pressure, pulse, and                            oxygen saturations were monitored continuously. The                            [id number]-H190 ([id number]) Olympus endoscope was introduced                            through the mouth, and advanced to the body of the                            stomach. The upper GI endoscopy was accomplished                            without difficulty. The patient tolerated the                            procedure well. Scope In: Scope Out: Findings:      Food was found in the middle third of the esophagus. Removal of food was       accomplished. Estimated blood loss: none.      One benign-appearing, intrinsic severe (stenosis; an endoscope cannot       pass) stenosis was found 30 to 34 cm from the incisors. This stenosis       measured 1 cm (inner diameter). The stenosis was traversed after       dilation. A TTS dilator was passed through the scope. Dilation with a       12-24-10 mm balloon dilator was performed to 12 mm. The dilation site       was examined and showed moderate mucosal disruption. Estimated blood       loss was minimal.      A small amount of food (residue) was found in the gastric body.      The cardia and gastric fundus were normal on retroflexion.      The exam was otherwise without abnormality. Impression:               - Food in the middle third of the esophagus.  Removal was successful.                           - Benign-appearing esophageal stenosis. Dilated. 12                            mm today up from 10 mm max last time (11/7)                           - A small amount of food (residue) in the stomach.                           - The examination was otherwise normal. Moderate Sedation:      Not Applicable - Patient had care per Anesthesia. Recommendation:           - Patient has a contact number available for                            emergencies. The signs and  symptoms of potential                            delayed complications were discussed with the                            patient. Return to normal activities tomorrow.                            Written discharge instructions were provided to the                            patient.                           - Clear liquids until 2 then very soft and then                            bite-sized tomorrow.                           - Continue present medications.                           - My office will arrange a repeat dilation -                            hopefully for next week - will contact her Procedure Code(s):        --- Professional ---                           [phone], Esophagoscopy, flexible, transoral; with                            removal of foreign body(s)  09811, Esophagoscopy, flexible, transoral; with                            transendoscopic balloon dilation (less than 30 mm                            diameter) Diagnosis Code(s):        --- Professional ---                           B14.782N, Food in esophagus causing other injury,                            initial encounter                           K22.2, Esophageal obstruction                           T18.2XXA, Foreign body in stomach, initial encounter                           R13.10, Dysphagia, unspecified CPT copyright 2019 American Medical Association. All rights reserved. The codes documented in this report are preliminary and upon coder review may  be revised to meet current compliance requirements. Gatha Mayer, MD 02/03/2021 1:25:08 PM This report has been signed electronically. Number of Addenda: 0

## 2021-02-04 ENCOUNTER — Telehealth: Payer: Self-pay

## 2021-02-04 ENCOUNTER — Other Ambulatory Visit: Payer: Self-pay

## 2021-02-04 DIAGNOSIS — R1319 Other dysphagia: Secondary | ICD-10-CM

## 2021-02-04 DIAGNOSIS — K222 Esophageal obstruction: Secondary | ICD-10-CM

## 2021-02-04 NOTE — Telephone Encounter (Signed)
Pt ordered and scheduledfor  an EGD with Balloon dilation on 02/10/2021 @ 10:15. @ [facility] with Dr. Henrene Pastor Case Number [id number] Ambulatory GI referral Placed: Prep Instructions sent via mychart.  Unable to leave message on pt phone. Voice mail not set up

## 2021-02-04 NOTE — Telephone Encounter (Signed)
-----   Message from Gatha Mayer, MD sent at 02/03/2021  4:25 PM EST ----- Regarding: schedule hospital EGD and dilation Remo Lipps,  This lady needs another EGD and balloon dilation at hospital (dialysis patient).  I see that Dr. Henrene Pastor has openings on 11/29 and she can make that per conversation after today's dilation.  Please schedule this and let her know details.  John,  Thanks in advance for your help. Pleasant woman w/ DM and ESRD and a tight GERD stricture.  Has had 2 dilations now up to 12 mm and trying to do sequential dilations every 1-2 weeks. As of now I have no openings in dec or Jan.  After you scope her I will look for a spot like I did this time and see what I can do in 1-2 weeks after you care for her.  Thanks again.  Glendell Docker

## 2021-02-04 NOTE — Telephone Encounter (Signed)
Pt was unable to reach via Phone. Pt was sent a My Chart Message notifying pt that she has been  ordered and scheduled for  an EGD with Balloon dilation on 02/10/2021 @ 10:15. @ [facility] with Dr. Henrene Pastor Pt to arrive at 8:45. Pt  EGD  Prep instructions were sent to pt via My Chart

## 2021-02-06 ENCOUNTER — Encounter (HOSPITAL_COMMUNITY): Payer: Self-pay | Admitting: Internal Medicine

## 2021-02-06 ENCOUNTER — Emergency Department (HOSPITAL_COMMUNITY)
Admission: EM | Admit: 2021-02-06 | Discharge: 2021-02-06 | Disposition: A | Discharge status: Home / Self Care | Payer: Medicare Other | Attending: Emergency Medicine | Admitting: Emergency Medicine

## 2021-02-06 DIAGNOSIS — T300 Burn of unspecified body region, unspecified degree: Secondary | ICD-10-CM

## 2021-02-06 DIAGNOSIS — T25022A Burn of unspecified degree of left foot, initial encounter: Secondary | ICD-10-CM | POA: Diagnosis not present

## 2021-02-06 DIAGNOSIS — X102XXA Contact with fats and cooking oils, initial encounter: Secondary | ICD-10-CM | POA: Diagnosis not present

## 2021-02-06 DIAGNOSIS — N186 End stage renal disease: Secondary | ICD-10-CM | POA: Insufficient documentation

## 2021-02-06 DIAGNOSIS — E11649 Type 2 diabetes mellitus with hypoglycemia without coma: Secondary | ICD-10-CM | POA: Insufficient documentation

## 2021-02-06 DIAGNOSIS — Z79899 Other long term (current) drug therapy: Secondary | ICD-10-CM | POA: Diagnosis not present

## 2021-02-06 DIAGNOSIS — I120 Hypertensive chronic kidney disease with stage 5 chronic kidney disease or end stage renal disease: Secondary | ICD-10-CM | POA: Diagnosis not present

## 2021-02-06 DIAGNOSIS — E1110 Type 2 diabetes mellitus with ketoacidosis without coma: Secondary | ICD-10-CM | POA: Insufficient documentation

## 2021-02-06 DIAGNOSIS — Z992 Dependence on renal dialysis: Secondary | ICD-10-CM | POA: Diagnosis not present

## 2021-02-06 DIAGNOSIS — T25021A Burn of unspecified degree of right foot, initial encounter: Secondary | ICD-10-CM | POA: Insufficient documentation

## 2021-02-06 DIAGNOSIS — S99921A Unspecified injury of right foot, initial encounter: Secondary | ICD-10-CM | POA: Diagnosis present

## 2021-02-06 DIAGNOSIS — D631 Anemia in chronic kidney disease: Secondary | ICD-10-CM | POA: Diagnosis not present

## 2021-02-06 DIAGNOSIS — Z794 Long term (current) use of insulin: Secondary | ICD-10-CM | POA: Diagnosis not present

## 2021-02-06 MED ORDER — SILVER SULFADIAZINE 1 % EX CREA: 1.0000 "application " | TOPICAL_CREAM | Freq: Every day | CUTANEOUS | 0 refills | Status: DC

## 2021-02-06 MED ORDER — HYDROCODONE-ACETAMINOPHEN 5-325 MG PO TABS
1.0000 | ORAL_TABLET | Freq: Once | ORAL | Status: AC
  Administered 2021-02-06: 1 via ORAL
  Filled 2021-02-06: qty 1

## 2021-02-06 MED ORDER — SILVER SULFADIAZINE 1 % EX CREA
TOPICAL_CREAM | Freq: Once | CUTANEOUS | Status: AC
  Filled 2021-02-06: qty 50

## 2021-02-06 MED ORDER — OXYCODONE HCL 5 MG PO TABS: 5.0000 mg | ORAL_TABLET | ORAL | 0 refills | Status: DC | PRN

## 2021-02-06 NOTE — ED Notes (Signed)
Pt has each foot in a separate basin, with normal saline in each.
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
## 2021-02-06 NOTE — ED Notes (Signed)
Open areas treated with Silvadene cream as ordered by Shelby Dubin, PA. Left foot wrapped in kerlex with coban. No open areas to right foot.
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
## 2021-02-07 LAB — POCT I-STAT, CHEM 8
BUN: 26 mg/dL — ABNORMAL HIGH (ref 6–20)
Calcium, Ion: 1.25 mmol/L (ref 1.15–1.40)
Chloride: 107 mmol/L (ref 98–111)
Creatinine, Ser: 8.1 mg/dL — ABNORMAL HIGH (ref 0.44–1.00)
Glucose, Bld: 146 mg/dL — ABNORMAL HIGH (ref 70–99)
HCT: 40 % (ref 36.0–46.0)
Hemoglobin: 13.6 g/dL (ref 12.0–15.0)
Potassium: 4.5 mmol/L (ref 3.5–5.1)
Sodium: 138 mmol/L (ref 135–145)
TCO2: 20 mmol/L — ABNORMAL LOW (ref 22–32)

## 2021-02-08 ENCOUNTER — Emergency Department (HOSPITAL_COMMUNITY)
Admission: EM | Admit: 2021-02-08 | Discharge: 2021-02-08 | Disposition: A | Discharge status: Home / Self Care | Payer: Medicare Other | Attending: Emergency Medicine | Admitting: Emergency Medicine

## 2021-02-08 ENCOUNTER — Encounter (HOSPITAL_COMMUNITY): Payer: Self-pay | Admitting: Emergency Medicine

## 2021-02-08 DIAGNOSIS — T25222D Burn of second degree of left foot, subsequent encounter: Secondary | ICD-10-CM | POA: Diagnosis not present

## 2021-02-08 DIAGNOSIS — E1022 Type 1 diabetes mellitus with diabetic chronic kidney disease: Secondary | ICD-10-CM | POA: Insufficient documentation

## 2021-02-08 DIAGNOSIS — X100XXD Contact with hot drinks, subsequent encounter: Secondary | ICD-10-CM | POA: Diagnosis not present

## 2021-02-08 DIAGNOSIS — Z79899 Other long term (current) drug therapy: Secondary | ICD-10-CM | POA: Diagnosis not present

## 2021-02-08 DIAGNOSIS — T25221D Burn of second degree of right foot, subsequent encounter: Secondary | ICD-10-CM | POA: Insufficient documentation

## 2021-02-08 DIAGNOSIS — I120 Hypertensive chronic kidney disease with stage 5 chronic kidney disease or end stage renal disease: Secondary | ICD-10-CM | POA: Insufficient documentation

## 2021-02-08 DIAGNOSIS — Y93G3 Activity, cooking and baking: Secondary | ICD-10-CM | POA: Diagnosis not present

## 2021-02-08 DIAGNOSIS — N186 End stage renal disease: Secondary | ICD-10-CM | POA: Diagnosis not present

## 2021-02-08 DIAGNOSIS — Z23 Encounter for immunization: Secondary | ICD-10-CM | POA: Diagnosis not present

## 2021-02-08 DIAGNOSIS — Z992 Dependence on renal dialysis: Secondary | ICD-10-CM | POA: Diagnosis not present

## 2021-02-08 MED ORDER — SILVER SULFADIAZINE 1 % EX CREA
TOPICAL_CREAM | Freq: Once | CUTANEOUS | Status: AC
  Filled 2021-02-08: qty 50

## 2021-02-08 MED ORDER — TETANUS-DIPHTH-ACELL PERTUSSIS 5-2.5-18.5 LF-MCG/0.5 IM SUSY
0.5000 mL | PREFILLED_SYRINGE | Freq: Once | INTRAMUSCULAR | Status: AC
  Administered 2021-02-08: 13:00:00 0.5 mL via INTRAMUSCULAR
  Filled 2021-02-08: qty 0.5
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
## 2021-02-08 NOTE — Discharge Instructions (Addendum)
Please follow-up with one of the wound care office is attached to these discharge papers.  Continue to attend dialysis.  Continue to redress your wounds, make sure to keep the area clean and dry.  You have been given a tetanus shot today.
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
## 2021-02-09 NOTE — Telephone Encounter (Addendum)
Inbound call from patient. States she have to reschedule her procedure at [facility] 11/29 due to have an out of town appt for her kidney evaluation.

## 2021-02-09 NOTE — Telephone Encounter (Signed)
Pt stated that she is trying to to find a ride for the procedure. Pt requested to be called back at 12:30 so she could call around to try and find a ride.

## 2021-02-09 NOTE — Telephone Encounter (Signed)
Pt stated that she WAS going to be able to make it to the appointment. Pt stated that she did have her prep instructions. Pt verbalized understanding with all questions answered.

## 2021-02-10 ENCOUNTER — Ambulatory Visit (HOSPITAL_COMMUNITY): Payer: Medicare Other | Admitting: Certified Registered"

## 2021-02-10 ENCOUNTER — Encounter (HOSPITAL_BASED_OUTPATIENT_CLINIC_OR_DEPARTMENT_OTHER): Payer: Self-pay | Admitting: *Deleted

## 2021-02-10 ENCOUNTER — Other Ambulatory Visit: Payer: Self-pay

## 2021-02-10 ENCOUNTER — Emergency Department (HOSPITAL_BASED_OUTPATIENT_CLINIC_OR_DEPARTMENT_OTHER)
Admission: EM | Admit: 2021-02-10 | Discharge: 2021-02-10 | Disposition: A | Discharge status: Home / Self Care | Payer: Medicare Other | Source: Home / Self Care | Attending: Emergency Medicine | Admitting: Emergency Medicine

## 2021-02-10 ENCOUNTER — Encounter (HOSPITAL_COMMUNITY): Payer: Self-pay | Admitting: Internal Medicine

## 2021-02-10 ENCOUNTER — Encounter (HOSPITAL_COMMUNITY)
Admission: RE | Disposition: A | Discharge status: Home / Self Care | Payer: Self-pay | Source: Home / Self Care | Attending: Internal Medicine

## 2021-02-10 ENCOUNTER — Emergency Department (HOSPITAL_BASED_OUTPATIENT_CLINIC_OR_DEPARTMENT_OTHER): Payer: Medicare Other

## 2021-02-10 ENCOUNTER — Ambulatory Visit (HOSPITAL_COMMUNITY)
Admission: RE | Admit: 2021-02-10 | Discharge: 2021-02-10 | Disposition: A | Discharge status: Home / Self Care | Payer: Medicare Other | Attending: Internal Medicine | Admitting: Internal Medicine

## 2021-02-10 DIAGNOSIS — K2100 Gastro-esophageal reflux disease with esophagitis, without bleeding: Secondary | ICD-10-CM | POA: Diagnosis not present

## 2021-02-10 DIAGNOSIS — R1310 Dysphagia, unspecified: Secondary | ICD-10-CM

## 2021-02-10 DIAGNOSIS — K222 Esophageal obstruction: Secondary | ICD-10-CM

## 2021-02-10 DIAGNOSIS — X102XXA Contact with fats and cooking oils, initial encounter: Secondary | ICD-10-CM | POA: Insufficient documentation

## 2021-02-10 DIAGNOSIS — N186 End stage renal disease: Secondary | ICD-10-CM | POA: Insufficient documentation

## 2021-02-10 DIAGNOSIS — Z79899 Other long term (current) drug therapy: Secondary | ICD-10-CM | POA: Insufficient documentation

## 2021-02-10 DIAGNOSIS — N189 Chronic kidney disease, unspecified: Secondary | ICD-10-CM | POA: Diagnosis not present

## 2021-02-10 DIAGNOSIS — E1122 Type 2 diabetes mellitus with diabetic chronic kidney disease: Secondary | ICD-10-CM | POA: Insufficient documentation

## 2021-02-10 DIAGNOSIS — R1319 Other dysphagia: Secondary | ICD-10-CM

## 2021-02-10 DIAGNOSIS — Z794 Long term (current) use of insulin: Secondary | ICD-10-CM | POA: Insufficient documentation

## 2021-02-10 DIAGNOSIS — E663 Overweight: Secondary | ICD-10-CM | POA: Insufficient documentation

## 2021-02-10 DIAGNOSIS — E119 Type 2 diabetes mellitus without complications: Secondary | ICD-10-CM | POA: Insufficient documentation

## 2021-02-10 DIAGNOSIS — Z6825 Body mass index (BMI) 25.0-25.9, adult: Secondary | ICD-10-CM | POA: Insufficient documentation

## 2021-02-10 DIAGNOSIS — Z992 Dependence on renal dialysis: Secondary | ICD-10-CM | POA: Insufficient documentation

## 2021-02-10 DIAGNOSIS — Z940 Kidney transplant status: Secondary | ICD-10-CM | POA: Insufficient documentation

## 2021-02-10 DIAGNOSIS — I10 Essential (primary) hypertension: Secondary | ICD-10-CM | POA: Insufficient documentation

## 2021-02-10 DIAGNOSIS — T25222A Burn of second degree of left foot, initial encounter: Secondary | ICD-10-CM | POA: Insufficient documentation

## 2021-02-10 DIAGNOSIS — T8612 Kidney transplant failure: Secondary | ICD-10-CM | POA: Insufficient documentation

## 2021-02-10 DIAGNOSIS — K3184 Gastroparesis: Secondary | ICD-10-CM

## 2021-02-10 DIAGNOSIS — T25222S Burn of second degree of left foot, sequela: Secondary | ICD-10-CM

## 2021-02-10 DIAGNOSIS — I959 Hypotension, unspecified: Secondary | ICD-10-CM | POA: Insufficient documentation

## 2021-02-10 HISTORY — PX: BALLOON DILATION: SHX5330

## 2021-02-10 HISTORY — PX: ESOPHAGOGASTRODUODENOSCOPY (EGD) WITH PROPOFOL: SHX5813

## 2021-02-10 LAB — BASIC METABOLIC PANEL
Anion gap: 16 — ABNORMAL HIGH (ref 5–15)
BUN: 25 mg/dL — ABNORMAL HIGH (ref 6–20)
CO2: 31 mmol/L (ref 22–32)
Calcium: 10.3 mg/dL (ref 8.9–10.3)
Chloride: 92 mmol/L — ABNORMAL LOW (ref 98–111)
Creatinine, Ser: 5.79 mg/dL — ABNORMAL HIGH (ref 0.44–1.00)
GFR, Estimated: 9 mL/min — ABNORMAL LOW (ref >60)
Glucose, Bld: 271 mg/dL — ABNORMAL HIGH (ref 70–99)
Potassium: 3.9 mmol/L (ref 3.5–5.1)
Sodium: 139 mmol/L (ref 135–145)

## 2021-02-10 LAB — CBC WITH DIFFERENTIAL/PLATELET
Abs Immature Granulocytes: 0.03 10*3/uL (ref 0.00–0.07)
Basophils Absolute: 0.1 10*3/uL (ref 0.0–0.1)
Basophils Relative: 1 %
Eosinophils Absolute: 0.2 10*3/uL (ref 0.0–0.5)
Eosinophils Relative: 2 %
HCT: 38.8 % (ref 36.0–46.0)
Hemoglobin: 12 g/dL (ref 12.0–15.0)
Immature Granulocytes: 0 %
Lymphocytes Relative: 17 %
Lymphs Abs: 1.3 10*3/uL (ref 0.7–4.0)
MCH: 28 pg (ref 26.0–34.0)
MCHC: 30.9 g/dL (ref 30.0–36.0)
MCV: 90.7 fL (ref 80.0–100.0)
Monocytes Absolute: 0.5 10*3/uL (ref 0.1–1.0)
Monocytes Relative: 7 %
Neutro Abs: 5.3 10*3/uL (ref 1.7–7.7)
Neutrophils Relative %: 73 %
Platelets: 186 10*3/uL (ref 150–400)
RBC: 4.28 MIL/uL (ref 3.87–5.11)
RDW: 17 % — ABNORMAL HIGH (ref 11.5–15.5)
WBC: 7.4 10*3/uL (ref 4.0–10.5)
nRBC: 0 % (ref 0.0–0.2)

## 2021-02-10 LAB — CBG MONITORING, ED: Glucose-Capillary: 229 mg/dL — ABNORMAL HIGH (ref 70–99)

## 2021-02-10 LAB — HCG, SERUM, QUALITATIVE: Preg, Serum: NEGATIVE

## 2021-02-10 SURGERY — ESOPHAGOGASTRODUODENOSCOPY (EGD) WITH PROPOFOL
Anesthesia: Monitor Anesthesia Care

## 2021-02-10 MED ORDER — FENTANYL CITRATE (PF) 100 MCG/2ML IJ SOLN
INTRAMUSCULAR | Status: AC
  Filled 2021-02-10: qty 2

## 2021-02-10 MED ORDER — METOCLOPRAMIDE HCL 10 MG PO TABS: 10.0000 mg | ORAL_TABLET | Freq: Three times a day (TID) | ORAL | 0 refills | Status: DC | PRN

## 2021-02-10 MED ORDER — PROPOFOL 10 MG/ML IV BOLUS
INTRAVENOUS | Status: DC | PRN
  Administered 2021-02-10: 10 mg via INTRAVENOUS

## 2021-02-10 MED ORDER — SODIUM CHLORIDE 0.9 % IV BOLUS
250.0000 mL | Freq: Once | INTRAVENOUS | Status: AC
  Administered 2021-02-10: 250 mL via INTRAVENOUS

## 2021-02-10 MED ORDER — PROPOFOL 1000 MG/100ML IV EMUL
INTRAVENOUS | Status: AC
  Filled 2021-02-10: qty 100

## 2021-02-10 MED ORDER — MIDAZOLAM HCL (PF) 5 MG/ML IJ SOLN
INTRAMUSCULAR | Status: AC
  Filled 2021-02-10: qty 1

## 2021-02-10 MED ORDER — ACETAMINOPHEN 500 MG PO TABS: 1000.0000 mg | ORAL_TABLET | Freq: Once | ORAL | Status: DC

## 2021-02-10 MED ORDER — FENTANYL CITRATE (PF) 100 MCG/2ML IJ SOLN
50.0000 ug | Freq: Once | INTRAMUSCULAR | Status: AC
  Administered 2021-02-10: 50 ug via INTRAVENOUS

## 2021-02-10 MED ORDER — BACITRACIN ZINC 500 UNIT/GM EX OINT: TOPICAL_OINTMENT | Freq: Two times a day (BID) | CUTANEOUS | Status: DC

## 2021-02-10 MED ORDER — PROPOFOL 10 MG/ML IV BOLUS
INTRAVENOUS | Status: AC
  Filled 2021-02-10: qty 20

## 2021-02-10 MED ORDER — PROPOFOL 500 MG/50ML IV EMUL
INTRAVENOUS | Status: DC | PRN
  Administered 2021-02-10: 150 ug/kg/min via INTRAVENOUS

## 2021-02-10 MED ORDER — MIDAZOLAM HCL (PF) 5 MG/ML IJ SOLN
1.0000 mg | Freq: Once | INTRAMUSCULAR | Status: AC
  Administered 2021-02-10: 1 mg via INTRAVENOUS

## 2021-02-10 MED ORDER — METOPROLOL TARTRATE 25 MG PO TABS
25.0000 mg | ORAL_TABLET | Freq: Once | ORAL | Status: AC
  Administered 2021-02-10: 25 mg via ORAL
  Filled 2021-02-10 (×2): qty 1

## 2021-02-10 MED ORDER — LIDOCAINE 2% (20 MG/ML) 5 ML SYRINGE
INTRAMUSCULAR | Status: DC | PRN
  Administered 2021-02-10: 40 mg via INTRAVENOUS

## 2021-02-10 MED ORDER — PROPOFOL 500 MG/50ML IV EMUL
INTRAVENOUS | Status: AC
  Filled 2021-02-10: qty 50

## 2021-02-10 MED ORDER — HALOPERIDOL LACTATE 5 MG/ML IJ SOLN
2.0000 mg | Freq: Once | INTRAMUSCULAR | Status: AC
  Administered 2021-02-10: 2 mg via INTRAVENOUS

## 2021-02-10 MED ORDER — SODIUM CHLORIDE 0.9 % IV SOLN: INTRAVENOUS | Status: DC

## 2021-02-10 MED ORDER — ONDANSETRON 4 MG PO TBDP
4.0000 mg | ORAL_TABLET | Freq: Once | ORAL | Status: AC
  Administered 2021-02-10: 4 mg via ORAL

## 2021-02-10 SURGICAL SUPPLY — 14 items

## 2021-02-10 NOTE — Op Note (Signed)
[facility] Patient Name: [name] Procedure Date: 02/10/2021 MRN: [id number] Attending MD: Docia Chuck. Henrene Pastor , MD Date of Birth: October 27, 1985 CSN: [id number] Age: 35 Admit Type: Outpatient Procedure:                Upper GI endoscopy with balloon dilation of the                            esophagus?"13.5 mm max Indications:              Therapeutic procedure, Dysphagia. 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 Last dilation 1 week ago today                            to a maximal diameter of 12 mm. She had been set up                            with me for repeat upper endoscopy with esophageal                            dilation. She reports being compliant with                            pantoprazole 40 mg twice daily. Providers:                Docia Chuck. Henrene Pastor, MD, Particia Nearing, RN, Tyna Jaksch                            Technician Referring MD:             Silvano Rusk, MD Medicines:                Monitored Anesthesia Care Complications:            No immediate complications. Estimated Blood Loss:     Estimated blood loss: none. Procedure:                Pre-Anesthesia Assessment:                           - Prior to the procedure, a History and Physical                            was performed, and patient medications and                            allergies were reviewed. The patient's tolerance of  previous anesthesia was also reviewed. The risks                            and benefits of the procedure and the sedation                            options and risks were discussed with the patient.                            All questions were answered, and informed consent                            was obtained. Prior Anticoagulants: The patient has                             taken no previous anticoagulant or antiplatelet                            agents. ASA Grade Assessment: III - A patient with                            severe systemic disease. After reviewing the risks                            and benefits, the patient was deemed in                            satisfactory condition to undergo the procedure.                           After obtaining informed consent, the endoscope was                            passed under direct vision. Throughout the                            procedure, the patient's blood pressure, pulse, and                            oxygen saturations were monitored continuously. The                            [id number]-H190 ([id number] Olympus endoscope was introduced                            through the mouth, and advanced to the second part                            of duodenum. The upper GI endoscopy was                            accomplished without difficulty. The patient  tolerated the procedure well. Scope In: Scope Out: Findings:      The esophagus revealed severe erosive esophagitis with friability and       edema. In addition, there was stricturing at approximately 30 cm from       the incisors. This measured about 9-10 mm. The standard adult endoscope       would not easily traverse this area.. A TTS dilator was passed through       the scope. Dilation with a 12-13.5-15 mm balloon dilator was performed       to 12 mm with no significant resistance. Dilation was subsequently       performed to 13.5 mm. The endoscope traversed beyond without difficulty.      Inspection of the esophagus post dilation revealed significant somewhat       irregular mucosal rent with bleeding but no obvious perforation.      The stomach was normal, save small hiatal hernia.      The examined duodenum was normal.      The cardia and gastric fundus were normal on retroflexion. Impression:               1.  Grade 4 esophagitis                           2. High-grade peptic stricture status post dilation                            to a maximal diameter of 13 mm                           3. Patent hernia. Otherwise unremarkable EGD. Moderate Sedation:      none Recommendation:           1. Standard post procedure observation.                           2. Patient has a contact number available for                            emergencies. The signs and symptoms of potential                            delayed complications were discussed with the                            patient. Return to normal activities tomorrow.                            Written discharge instructions were provided to the                            patient.                           3. N.p.o. for 2 hours, then clear liquids for 2                            hours then full liquids today. Soft diet starting  tomorrow.                           4. Continue present medications. Please make sure                            that you are taking your pantoprazole twice daily                           5. Share this information with Dr. Cyndie Chime to see if                            he wants to adjust medical therapy. Also to decide                            like to have a repeat endoscopy with esophageal                            dilation.                           . Procedure Code(s):        --- Professional ---                           [phone], Esophagogastroduodenoscopy, flexible,                            transoral; with transendoscopic balloon dilation of                            esophagus (less than 30 mm diameter) Diagnosis Code(s):        --- Professional ---                           R13.10, Dysphagia, unspecified CPT copyright 2019 American Medical Association. All rights reserved. The codes documented in this report are preliminary and upon coder review may  be revised to meet current  compliance requirements. Docia Chuck. Henrene Pastor, MD 02/10/2021 10:56:51 AM This report has been signed electronically. Number of Addenda: 0

## 2021-02-10 NOTE — Discharge Instructions (Signed)
      [facility] ENDOSCOPY [address] [place], [place]  [address] Phone:  [phone]   February 10, 2021  Patient: [name]  Date of Birth: 08/05/1985  Date of Visit: February 10, 2021    To Whom It May Concern:  [name] was seen and treated on February 10, 2021 and Sloan Leiter was her ride home and to stay with her during the day after discharge.      .           If you have any questions or concerns, please don't hesitate to call [phone]   Sincerely,       Treatment Team:  Attending Provider: Irene Shipper, MD

## 2021-02-10 NOTE — ED Notes (Signed)
This nurse in room to administer medications. When nurse told patient what the medications were for, patient began crying and demanding to see the provider. This nurse communicated to patient that the provider had ordered tylenol and would not be prescribing narcotics. Patient began moaning "I need some oxy or some pain med in my IV , tylenol isn't going to work, I am not taking the tylenol."  This nurse notified provider, provider at bedside.

## 2021-02-10 NOTE — ED Provider Notes (Signed)
Patient did not want to wait for results of CT scan as she needs to be at endoscopy this am.  Instructed to keep wound clinic appointment for feet.  No signs of infection of feet, appear improved based on previous description and images    Susanne Baumgarner, MD 02/10/21 [id number]

## 2021-02-10 NOTE — Anesthesia Preprocedure Evaluation (Addendum)
Anesthesia Evaluation  Patient identified by MRN, date of birth, ID band Patient awake    Reviewed: Allergy & Precautions, NPO status , Patient's Chart, lab work & pertinent test results  Airway Mallampati: II  TM Distance: >3 FB     Dental   Pulmonary    breath sounds clear to auscultation       Cardiovascular hypertension,  Rhythm:Regular Rate:Normal     Neuro/Psych    GI/Hepatic Neg liver ROS, hiatal hernia, GERD  ,  Endo/Other  diabetes  Renal/GU Renal disease     Musculoskeletal   Abdominal   Peds  Hematology   Anesthesia Other Findings   Reproductive/Obstetrics                            Anesthesia Physical Anesthesia Plan  ASA: 3  Anesthesia Plan: MAC   Post-op Pain Management:    Induction: Intravenous  PONV Risk Score and Plan: 2 and Ondansetron, Midazolam and Propofol infusion  Airway Management Planned: Nasal Cannula and Simple Face Mask  Additional Equipment:   Intra-op Plan:   Post-operative Plan:   Informed Consent: I have reviewed the patients History and Physical, chart, labs and discussed the procedure including the risks, benefits and alternatives for the proposed anesthesia with the patient or authorized representative who has indicated his/her understanding and acceptance.     Dental advisory given  Plan Discussed with: CRNA and Anesthesiologist  Anesthesia Plan Comments:         Anesthesia Quick Evaluation
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
## 2021-02-10 NOTE — H&P (Signed)
HISTORY OF PRESENT ILLNESS:  [name] is a 35 y.o. female, patient of Dr. Arelia Longest, with multiple medical problems as listed below.  She presents today for esophageal dilation.  She has a high-grade peptic stricture which is being treated with frequent serial dilations.  Last dilation 1 week ago to a maximal diameter of 12 mm.  She does state that she has improved in her swallowing immediately post dilation.  She does take pantoprazole twice daily for her reflux.  She tells me that she has been compliant.  She reports vague epigastric pain prior to this procedure.  She tells me she has had problems like this in the past.  She is in no distress.  REVIEW OF SYSTEMS:  All non-GI ROS negative.  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   Pyelonephritis 06/23/2014   Renal disorder    Seasonal allergies    Seizures ([place])    UTI (urinary tract infection) 01/09/2015   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   Qualifier: Diagnosis of  By: Carlena Sax  MD, Colletta Maryland      Past Surgical History:  Procedure Laterality Date   ARTERIOVENOUS GRAFT PLACEMENT Bilateral    "neither work" (10/24/2017)   AV FISTULA PLACEMENT Left 10/26/2018   Procedure: CREATION OF ARTERIOVENOUS FISTULA  LEFT ARM;  Surgeon: [name], MD;  Location: [facility];  Service: Vascular;  Laterality: Left;   AV FISTULA PLACEMENT Left 05/23/2020   Procedure: LEFT ARM ARTERIOVENOUS (AV) FISTULA CREATION;  Surgeon: [name], MD;  Location: [facility];  Service: Vascular;   Laterality: Left;   BALLOON DILATION N/A 01/19/2021   Procedure: Larrie Kass DILATION;  Surgeon: Gatha Mayer, MD;  [facility];  Service: Endoscopy;  Laterality: N/A;   BASCILIC VEIN TRANSPOSITION Left 12/21/2018   Procedure: Left arm BASILIC VEIN TRANSPOSITION SECOND STAGE;  Surgeon: [name], MD;  Location: [facility];  Service: Vascular;  Laterality: Left;   CHOLECYSTECTOMY N/A 06/30/2017   Procedure: LAPAROSCOPIC CHOLECYSTECTOMY WITH INTRAOPERATIVE CHOLANGIOGRAM;  Surgeon: Excell Seltzer, MD;  [facility];  Service: General;  Laterality: N/A;   ESOPHAGOGASTRODUODENOSCOPY (EGD) WITH PROPOFOL N/A 07/04/2017   Procedure: ESOPHAGOGASTRODUODENOSCOPY (EGD) WITH PROPOFOL;  Surgeon: Clarene Essex, MD;  [facility];  Service: Endoscopy;  Laterality: N/A;   ESOPHAGOGASTRODUODENOSCOPY (EGD) WITH PROPOFOL N/A 08/10/2020   Procedure: ESOPHAGOGASTRODUODENOSCOPY (EGD) WITH PROPOFOL;  Surgeon: Doran Stabler, MD;  Location: [facility];  Service: Gastroenterology;  Laterality: N/A;   ESOPHAGOGASTRODUODENOSCOPY (EGD) WITH PROPOFOL N/A 01/19/2021   Procedure: ESOPHAGOGASTRODUODENOSCOPY (EGD) WITH PROPOFOL;  Surgeon: Gatha Mayer, MD;  [facility];  Service: Endoscopy;  Laterality: N/A;  WITH FLUOROSCOPY AND DILATION   ESOPHAGOGASTRODUODENOSCOPY (EGD) WITH PROPOFOL N/A 02/03/2021   Procedure: ESOPHAGOGASTRODUODENOSCOPY (EGD) WITH PROPOFOL;  Surgeon: Gatha Mayer, MD;  [facility];  Service: Endoscopy;  Laterality: N/A;   FOREIGN BODY REMOVAL N/A 02/03/2021   Procedure: FOREIGN BODY REMOVAL;  Surgeon: Gatha Mayer, MD;  [facility];  Service: Endoscopy;  Laterality: N/A;   INSERTION OF DIALYSIS CATHETER N/A 03/20/2018   Procedure: INSERTION OF TUNNELED DIALYSIS CATHETER - RIGHT INTERANL JUGULAR PLACEMENT;  Surgeon: Angelia Mould, MD;  Location: [facility];  Service: Vascular;  Laterality: N/A;   IR FLUORO GUIDE CV LINE RIGHT  04/18/2020    IR GUIDED DRAIN W CATHETER PLACEMENT  10/28/2018   KIDNEY TRANSPLANT  2007   KIDNEY TRANSPLANT Right    PARATHYROIDECTOMY  ?2012   "3/4 removed" (10/24/2017)   RENAL BIOPSY Bilateral 2003   REVISON OF ARTERIOVENOUS FISTULA Left 09/24/2020   Procedure: LEFT UPPER ARM ARTERIOVENOUS GRAFT CREATION;  Surgeon: [name], MD;  Location: [facility];  Service: Vascular;  Laterality: Left;   UPPER EXTREMITY VENOGRAPHY Bilateral 10/19/2018   Procedure: UPPER EXTREMITY VENOGRAPHY;  Surgeon: [name], MD;  Location: [place] [facility];  Service: Cardiovascular;  Laterality: Bilateral;  Bilateral    UPPER EXTREMITY VENOGRAPHY Left 09/04/2020   Procedure: UPPER EXTREMITY VENOGRAPHY - Left Upper;  Surgeon: [name], MD;  Location: [facility];  Service: Cardiovascular;  Laterality: Left;    Social History [name]  reports that she has never smoked. She has never been exposed to tobacco smoke. She has never used smokeless tobacco. She reports that she does not currently use alcohol. She reports that she does not use drugs.  family history includes Aneurysm in her mother; Arthritis in her mother; CAD in her father; Diabetes in her father; Early death in her brother; Hypertension in her mother.  Allergies  Allergen Reactions   Diphenhydramine Anaphylaxis   Doxycycline Shortness Of Breath   Motrin [Ibuprofen] Shortness Of Breath and Itching   Contrast Media [Iodinated Diagnostic Agents] Itching   Shellfish Allergy Hives   Banana Itching, Nausea And Vomiting and Other (See Comments)    Sick on the stomach   Chlorhexidine Itching   Ferrous Sulfate Itching   Iron Dextran Itching and Other (See Comments)    Vein irritation        PHYSICAL EXAMINATION:  Vital signs: BP (!) 203/113   Pulse (!) 101   Resp 18   Ht 5\' 6"  (1.676 m)   Wt 79.4 kg   SpO2 91%   BMI 28.25 kg/m  General: Well-developed, well-nourished, no acute distress HEENT: Sclerae are  anicteric, conjunctiva pink. Oral mucosa intact Lungs: Clear Heart: Regular Abdomen: soft, nontender, nondistended, no obvious ascites, no peritoneal signs, normal bowel sounds. No organomegaly. Extremities: No edema Psychiatric: alert and oriented x3. Cooperative     ASSESSMENT:  1.  GERD complicated by high-grade peptic stricture.  Here for repeat dilation   PLAN:  1.  Repeat esophageal dilation.The nature of the procedure, as well as the risks, benefits, and alternatives were carefully and thoroughly reviewed with the patient. Ample time for discussion and questions allowed. The patient understood, was satisfied, and agreed to proceed.

## 2021-02-10 NOTE — ED Notes (Signed)
Patient request nurse in room, patient states she needs to be discharged as she has an appointment at [facility] at 8:45am. Provider notified.

## 2021-02-10 NOTE — Anesthesia Procedure Notes (Signed)
Procedure Name: MAC Date/Time: 02/10/2021 10:27 AM Performed by: Eben Burow, CRNA Pre-anesthesia Checklist: Patient identified, Emergency Drugs available, Suction available, Patient being monitored and Timeout performed Oxygen Delivery Method: Simple face mask Placement Confirmation: positive ETCO2

## 2021-02-10 NOTE — Transfer of Care (Signed)
Immediate Anesthesia Transfer of Care Note  Patient: [name]  Procedure(s) Performed: ESOPHAGOGASTRODUODENOSCOPY (EGD) WITH PROPOFOL BALLOON DILATION  Patient Location: PACU  Anesthesia Type:General  Level of Consciousness: awake  Airway & Oxygen Therapy: Patient Spontanous Breathing and Patient connected to face mask oxygen  Post-op Assessment: Report given to RN, Post -op Vital signs reviewed and stable and Patient moving all extremities X 4  Post vital signs: Reviewed and stable  Last Vitals:  Vitals Value Taken Time  BP 141/97 02/10/21 1046  Temp    Pulse 99 02/10/21 1046  Resp 14   SpO2 100 % 02/10/21 1046  Vitals shown include unvalidated device data.  Last Pain:  Vitals:   02/10/21 0923  TempSrc: Oral  PainSc: 10-Worst pain ever         Complications: No notable events documented.

## 2021-02-10 NOTE — Anesthesia Postprocedure Evaluation (Signed)
Anesthesia Post Note  Patient: [name]  Procedure(s) Performed: ESOPHAGOGASTRODUODENOSCOPY (EGD) WITH PROPOFOL BALLOON DILATION     Patient location during evaluation: PACU Anesthesia Type: MAC Level of consciousness: awake Pain management: pain level controlled Vital Signs Assessment: post-procedure vital signs reviewed and stable Respiratory status: spontaneous breathing Postop Assessment: no apparent nausea or vomiting Anesthetic complications: no   No notable events documented.  Last Vitals:  Vitals:   02/10/21 1122 02/10/21 1130  BP: (!) 176/102 (!) 177/100  Pulse: (!) 103 (!) 101  Resp: (!) 23 (!) 23  Temp:    SpO2: 94% 93%    Last Pain:  Vitals:   02/10/21 1055  TempSrc: Oral  PainSc:                  [name]

## 2021-02-12 ENCOUNTER — Encounter (HOSPITAL_COMMUNITY): Payer: Self-pay | Admitting: Internal Medicine

## 2021-02-13 ENCOUNTER — Telehealth: Payer: Self-pay | Admitting: Internal Medicine

## 2021-02-13 NOTE — Telephone Encounter (Signed)
[name] and [name]   This patient has a tight esophageal stricture and is undergoing serial balloon dilations at the hospital (dialysis patient).  She last had a dilation this Tuesday up to 13.5 mm by Jenny Reichmann.  I have dilated her a couple of times prior to that.   You both have openings on Thursday, December 8 for outpatients.  Looks like [name] has 60 and [name] has an appointment later that morning.  I would appreciate help if 1 of you is willing and able to do this procedure.  I have spoken to the patient and she is available on that day.  She dialyzes Monday Wednesday Friday.  Thanks.  I have copied Remo Lipps on this so we can make arrangements once we hear back.

## 2021-02-16 ENCOUNTER — Other Ambulatory Visit: Payer: Self-pay

## 2021-02-16 DIAGNOSIS — K222 Esophageal obstruction: Secondary | ICD-10-CM

## 2021-02-16 DIAGNOSIS — R1319 Other dysphagia: Secondary | ICD-10-CM

## 2021-02-16 NOTE — Telephone Encounter (Signed)
Pt scheduled for and EGD with Ballon Dilation on 02/19/2021 @ 7:30 with Dr. Lorenso Courier at [facility]. Ambulatory Referral To GI placed Instructions were created and sent to pt via Mychart Left message for pt to call back.

## 2021-02-16 NOTE — Telephone Encounter (Signed)
Dr. Lorenso Courier has agreed to perform the procedure at 730 on Thursday 12/8  Please schedule the patient with her

## 2021-02-16 NOTE — Telephone Encounter (Signed)
Pt scheduled for and EGD with Ballon Dilation on 02/19/2021 @ 7:30 with Dr. Lorenso Courier at [facility]. Pt to arrive at 6:00 AM Pt made aware Ambulatory Referral To GI placed Prep Instructions were created and sent to pt via Mychart Pt made aware. Pt verbalized understanding with all questions answered.

## 2021-02-19 ENCOUNTER — Ambulatory Visit (HOSPITAL_COMMUNITY): Payer: Medicare Other | Admitting: Anesthesiology

## 2021-02-19 ENCOUNTER — Other Ambulatory Visit: Payer: Self-pay

## 2021-02-19 ENCOUNTER — Ambulatory Visit (HOSPITAL_COMMUNITY)
Admission: RE | Admit: 2021-02-19 | Discharge: 2021-02-19 | Disposition: A | Discharge status: Home / Self Care | Payer: Medicare Other | Attending: Internal Medicine | Admitting: Internal Medicine

## 2021-02-19 ENCOUNTER — Encounter (HOSPITAL_COMMUNITY): Payer: Self-pay | Admitting: Internal Medicine

## 2021-02-19 ENCOUNTER — Encounter (HOSPITAL_COMMUNITY)
Admission: RE | Disposition: A | Discharge status: Home / Self Care | Payer: Self-pay | Source: Home / Self Care | Attending: Internal Medicine

## 2021-02-19 DIAGNOSIS — R1319 Other dysphagia: Secondary | ICD-10-CM | POA: Diagnosis not present

## 2021-02-19 DIAGNOSIS — Z992 Dependence on renal dialysis: Secondary | ICD-10-CM | POA: Insufficient documentation

## 2021-02-19 DIAGNOSIS — Z794 Long term (current) use of insulin: Secondary | ICD-10-CM | POA: Diagnosis not present

## 2021-02-19 DIAGNOSIS — I10 Essential (primary) hypertension: Secondary | ICD-10-CM | POA: Diagnosis not present

## 2021-02-19 DIAGNOSIS — E1122 Type 2 diabetes mellitus with diabetic chronic kidney disease: Secondary | ICD-10-CM | POA: Diagnosis not present

## 2021-02-19 DIAGNOSIS — Z940 Kidney transplant status: Secondary | ICD-10-CM | POA: Insufficient documentation

## 2021-02-19 DIAGNOSIS — N186 End stage renal disease: Secondary | ICD-10-CM | POA: Diagnosis not present

## 2021-02-19 DIAGNOSIS — Z79899 Other long term (current) drug therapy: Secondary | ICD-10-CM | POA: Diagnosis not present

## 2021-02-19 DIAGNOSIS — K2100 Gastro-esophageal reflux disease with esophagitis, without bleeding: Secondary | ICD-10-CM | POA: Insufficient documentation

## 2021-02-19 DIAGNOSIS — I120 Hypertensive chronic kidney disease with stage 5 chronic kidney disease or end stage renal disease: Secondary | ICD-10-CM | POA: Insufficient documentation

## 2021-02-19 DIAGNOSIS — K449 Diaphragmatic hernia without obstruction or gangrene: Secondary | ICD-10-CM | POA: Insufficient documentation

## 2021-02-19 DIAGNOSIS — K3189 Other diseases of stomach and duodenum: Secondary | ICD-10-CM | POA: Insufficient documentation

## 2021-02-19 DIAGNOSIS — K222 Esophageal obstruction: Secondary | ICD-10-CM | POA: Diagnosis present

## 2021-02-19 HISTORY — PX: BALLOON DILATION: SHX5330

## 2021-02-19 HISTORY — PX: ESOPHAGOGASTRODUODENOSCOPY (EGD) WITH PROPOFOL: SHX5813

## 2021-02-19 LAB — POCT I-STAT, CHEM 8
BUN: 11 mg/dL (ref 6–20)
Calcium, Ion: 0.89 mmol/L — CL (ref 1.15–1.40)
Chloride: 96 mmol/L — ABNORMAL LOW (ref 98–111)
Creatinine, Ser: 6.1 mg/dL — ABNORMAL HIGH (ref 0.44–1.00)
Glucose, Bld: 136 mg/dL — ABNORMAL HIGH (ref 70–99)
HCT: 31 % — ABNORMAL LOW (ref 36.0–46.0)
Hemoglobin: 10.5 g/dL — ABNORMAL LOW (ref 12.0–15.0)
Potassium: 3.3 mmol/L — ABNORMAL LOW (ref 3.5–5.1)
Sodium: 137 mmol/L (ref 135–145)
TCO2: 30 mmol/L (ref 22–32)

## 2021-02-19 SURGERY — ESOPHAGOGASTRODUODENOSCOPY (EGD) WITH PROPOFOL
Anesthesia: Monitor Anesthesia Care

## 2021-02-19 MED ORDER — PROPOFOL 10 MG/ML IV BOLUS
INTRAVENOUS | Status: AC
  Filled 2021-02-19: qty 20

## 2021-02-19 MED ORDER — SODIUM CHLORIDE 0.9 % IV SOLN
INTRAVENOUS | Status: DC
  Administered 2021-02-19: 500 mL via INTRAVENOUS

## 2021-02-19 MED ORDER — LIDOCAINE 2% (20 MG/ML) 5 ML SYRINGE
INTRAMUSCULAR | Status: DC | PRN
  Administered 2021-02-19: 80 mg via INTRAVENOUS

## 2021-02-19 MED ORDER — PROPOFOL 1000 MG/100ML IV EMUL
INTRAVENOUS | Status: AC
  Filled 2021-02-19: qty 100

## 2021-02-19 MED ORDER — PROPOFOL 10 MG/ML IV BOLUS
INTRAVENOUS | Status: DC | PRN
  Administered 2021-02-19 (×3): 20 mg via INTRAVENOUS
  Administered 2021-02-19: 10 mg via INTRAVENOUS
  Administered 2021-02-19: 20 mg via INTRAVENOUS

## 2021-02-19 MED ORDER — PROPOFOL 500 MG/50ML IV EMUL
INTRAVENOUS | Status: DC | PRN
  Administered 2021-02-19: 125 ug/kg/min via INTRAVENOUS

## 2021-02-19 SURGICAL SUPPLY — 14 items

## 2021-02-19 NOTE — Discharge Instructions (Addendum)
YOU HAD AN ENDOSCOPIC PROCEDURE TODAY: Refer to the procedure report and other information in the discharge instructions given to you for any specific questions about what was found during the examination. If this information does not answer your questions, please call [place] office at [phone] to clarify.   YOU SHOULD EXPECT: Some feelings of bloating in the abdomen. Passage of more gas than usual. Walking can help get rid of the air that was put into your GI tract during the procedure and reduce the bloating. If you had a lower endoscopy (such as a colonoscopy or flexible sigmoidoscopy) you may notice spotting of blood in your stool or on the toilet paper. Some abdominal soreness may be present for a day or two, also.  DIET: Your first meal following the procedure should be a light meal and then it is ok to progress to your normal diet. A half-sandwich or bowl of soup is an example of a good first meal. Heavy or fried foods are harder to digest and may make you feel nauseous or bloated. Drink plenty of fluids but you should avoid alcoholic beverages for 24 hours. If you had a esophageal dilation, please see attached instructions for diet.    ACTIVITY: Your care partner should take you home directly after the procedure. You should plan to take it easy, moving slowly for the rest of the day. You can resume normal activity the day after the procedure however YOU SHOULD NOT DRIVE, use power tools, machinery or perform tasks that involve climbing or major physical exertion for 24 hours (because of the sedation medicines used during the test).   SYMPTOMS TO REPORT IMMEDIATELY: A gastroenterologist can be reached at any hour. Please call [phone]  for any of the following symptoms:  Following upper endoscopy (EGD, EUS, ERCP, esophageal dilation) Vomiting of blood or coffee ground material  New, significant abdominal pain  New, significant chest pain or pain under the shoulder blades  Painful or  persistently difficult swallowing  New shortness of breath  Black, tarry-looking or red, bloody stools  FOLLOW UP:  If any biopsies were taken you will be contacted by phone or by letter within the next 1-3 weeks. Call [phone]  if you have not heard about the biopsies in 3 weeks.  Please also call with any specific questions about appointments or follow up tests.      [facility] ENDOSCOPY [address] [place], [place]  [address] Phone:  [phone]   February 19, 2021  Patient: [name]  Date of Birth: 08/29/1985  Date of Visit: February 19, 2021    To Whom It May Concern:  Shikita Vaillancourt was seen and treated on December 8, 2022at [facility]  and  Sloan Leiter was her driver and care partner for the day.  .           If you have any questions or concerns, please don't hesitate to call [phone].   Sincerely,       Treatment Team:  Attending Provider: Sharyn Creamer, MD

## 2021-02-19 NOTE — Anesthesia Postprocedure Evaluation (Signed)
Anesthesia Post Note  Patient: [name]  Procedure(s) Performed: ESOPHAGOGASTRODUODENOSCOPY (EGD) WITH PROPOFOL BALLOON DILATION     Patient location during evaluation: PACU Anesthesia Type: MAC Level of consciousness: awake and alert Pain management: pain level controlled Vital Signs Assessment: post-procedure vital signs reviewed and stable Respiratory status: spontaneous breathing, nonlabored ventilation, respiratory function stable and patient connected to nasal cannula oxygen Cardiovascular status: stable and blood pressure returned to baseline Postop Assessment: no apparent nausea or vomiting Anesthetic complications: no   No notable events documented.  Last Vitals:  Vitals:   02/19/21 0850 02/19/21 0900  BP: (!) 150/91 (!) 157/96  Pulse: 99 96  Resp: 13 15  Temp: 36.6 C   SpO2: 95% 98%    Last Pain:  Vitals:   02/19/21 0900  TempSrc:   PainSc: 0-No pain                 Effie Berkshire

## 2021-02-19 NOTE — H&P (View-Only) (Signed)
GASTROENTEROLOGY PROCEDURE H&P NOTE   Primary Care Physician: [name], MD    Reason for Procedure:   Dysphagia, recurrent esophageal stricture  Plan:    EGD  Patient is appropriate for endoscopic procedure(s) in the hospital setting  The nature of the procedure, as well as the risks, benefits, and alternatives were carefully and thoroughly reviewed with the patient. Ample time for discussion and questions allowed. The patient understood, was satisfied, and agreed to proceed.     HPI: [name] is a 35 y.o. female who presents for dysphagia for recurrent esophageal stricture. She states that she had some mild improvement in her dysphagia after her last dilation last week with Dr. Henrene Pastor. She is still having dysphagia with occasional vomiting of food back up. She has been losing weight over time. Denies use of blood thinners. Denies caustic ingestions in the past. She states that when she was a child, she had to have dilations as well. She has been compliant with her omeprazole 40 mg BID.  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   Pyelonephritis 06/23/2014   Renal disorder    Seasonal allergies    Seizures ([place])    UTI (urinary tract infection) 01/09/2015   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   Qualifier: Diagnosis of  By: Carlena Sax  MD, Colletta [place]      Past Surgical History:  Procedure Laterality Date   ARTERIOVENOUS GRAFT PLACEMENT Bilateral    "neither work" (10/24/2017)   AV FISTULA PLACEMENT Left 10/26/2018    Procedure: CREATION OF ARTERIOVENOUS FISTULA  LEFT ARM;  Surgeon: [name], MD;  Location: [place];  Service: Vascular;  Laterality: Left;   AV FISTULA PLACEMENT Left 05/23/2020   Procedure: LEFT ARM ARTERIOVENOUS (AV) FISTULA CREATION;  Surgeon: [name], MD;  Location: [place];  Service: Vascular;  Laterality: Left;   BALLOON DILATION N/A 01/19/2021   Procedure: Larrie Kass DILATION;  Surgeon: Gatha Mayer, MD;  Location: [facility];  Service: Endoscopy;  Laterality: N/A;   BALLOON DILATION N/A 02/10/2021   Procedure: BALLOON DILATION;  Surgeon: Irene Shipper, MD;  Location: [facility];  Service: Endoscopy;  Laterality: N/A;   BASCILIC VEIN TRANSPOSITION Left 12/21/2018   Procedure: Left arm BASILIC VEIN TRANSPOSITION SECOND STAGE;  Surgeon: [name], MD;  Location: [place];  Service: Vascular;  Laterality: Left;   CHOLECYSTECTOMY N/A 06/30/2017   Procedure: LAPAROSCOPIC CHOLECYSTECTOMY WITH INTRAOPERATIVE CHOLANGIOGRAM;  Surgeon: Excell Seltzer, MD;  Location: [facility];  Service: General;  Laterality: N/A;   ESOPHAGOGASTRODUODENOSCOPY (EGD) WITH PROPOFOL N/A 07/04/2017   Procedure: ESOPHAGOGASTRODUODENOSCOPY (EGD) WITH PROPOFOL;  Surgeon: Clarene Essex, MD;  [facility];  Service: Endoscopy;  Laterality: N/A;   ESOPHAGOGASTRODUODENOSCOPY (EGD) WITH PROPOFOL N/A 08/10/2020   Procedure: ESOPHAGOGASTRODUODENOSCOPY (EGD) WITH PROPOFOL;  Surgeon: Doran Stabler, MD;  Location: [facility];  Service: Gastroenterology;  Laterality: N/A;   ESOPHAGOGASTRODUODENOSCOPY (EGD) WITH PROPOFOL N/A 01/19/2021   Procedure: ESOPHAGOGASTRODUODENOSCOPY (EGD) WITH PROPOFOL;  Surgeon: Gatha Mayer, MD;  Location[facility];  Service: Endoscopy;  Laterality: N/A;  WITH FLUOROSCOPY AND DILATION   ESOPHAGOGASTRODUODENOSCOPY (EGD) WITH PROPOFOL N/A 02/03/2021   Procedure: ESOPHAGOGASTRODUODENOSCOPY (EGD) WITH PROPOFOL;  Surgeon: Gatha Mayer, MD;  [facility];   Service: Endoscopy;  Laterality: N/A;   ESOPHAGOGASTRODUODENOSCOPY (EGD) WITH PROPOFOL N/A 02/10/2021   Procedure: ESOPHAGOGASTRODUODENOSCOPY (EGD) WITH PROPOFOL;  Surgeon: Irene Shipper, MD;  [facility];  Service: Endoscopy;  Laterality: N/A;  Balloon Dilation   FOREIGN BODY REMOVAL N/A 02/03/2021   Procedure: FOREIGN BODY REMOVAL;  Surgeon: Gatha Mayer, MD;  [facility]: [facility];  Service: Endoscopy;  Laterality: N/A;   INSERTION OF DIALYSIS CATHETER N/A 03/20/2018   Procedure: INSERTION OF TUNNELED DIALYSIS CATHETER - RIGHT INTERANL JUGULAR PLACEMENT;  Surgeon: Angelia Mould, MD;  Location: [facility];  Service: Vascular;  Laterality: N/A;   IR FLUORO GUIDE CV LINE RIGHT  04/18/2020   IR GUIDED DRAIN W CATHETER PLACEMENT  10/28/2018   KIDNEY TRANSPLANT  2007   KIDNEY TRANSPLANT Right    PARATHYROIDECTOMY  ?2012   "3/4 removed" (10/24/2017)   RENAL BIOPSY Bilateral 2003   REVISON OF ARTERIOVENOUS FISTULA Left 09/24/2020   Procedure: LEFT UPPER ARM ARTERIOVENOUS GRAFT CREATION;  Surgeon: [name], MD;  Location: [facility];  Service: Vascular;  Laterality: Left;   UPPER EXTREMITY VENOGRAPHY Bilateral 10/19/2018   Procedure: UPPER EXTREMITY VENOGRAPHY;  Surgeon: [name], MD;  Location: [facility];  Service: Cardiovascular;  Laterality: Bilateral;  Bilateral    UPPER EXTREMITY VENOGRAPHY Left 09/04/2020   Procedure: UPPER EXTREMITY VENOGRAPHY - Left Upper;  Surgeon: [name], MD;  Location: [facility];  Service: Cardiovascular;  Laterality: Left;    Prior to Admission medications   Medication Sig Start Date End Date Taking? Authorizing Provider  acetaminophen (TYLENOL) 500 MG tablet Take 500-1,000 mg by mouth every 6 (six) hours as needed for moderate pain.   Yes [provider]  calcium acetate (PHOSLO) 667 MG capsule Take 1,334 mg by mouth 3 (three) times daily with meals. Take 1-2 capsules (149-7026 mg) by mouth with  snacks & take 2 capsules (1334 mg) by mouth with each meal 10/26/20  Yes [provider]  Calcium Carbonate Antacid (CALCIUM CARBONATE, DOSED IN MG ELEMENTAL CALCIUM,) 1250 MG/5ML SUSP Take 500 mg of elemental calcium by mouth daily. 10/22/20  Yes [provider]  famotidine (PEPCID) 40 MG tablet Take 40 mg by mouth at bedtime. 06/26/20  Yes [provider]  gabapentin (NEURONTIN) 400 MG capsule Take 800-1,200 mg by mouth at bedtime. 12/30/20  Yes [provider]  hydrOXYzine (ATARAX/VISTARIL) 50 MG tablet Take 50 mg by mouth 2 (two) times daily as needed for anxiety. 11/03/20  Yes [provider]  insulin aspart (NOVOLOG FLEXPEN) 100 UNIT/ML FlexPen Inject 0-15 Units into the skin in the morning, at noon, in the evening, and at bedtime. Sliding Scale insulin   Yes [provider]  insulin degludec (TRESIBA) 100 UNIT/ML FlexTouch Pen Inject 20 Units into the skin daily. Patient taking differently: Inject 20 Units into the skin daily. 02/15/20  Yes Dwyane Dee, MD  lidocaine-prilocaine (EMLA) cream Apply 1 application topically daily as needed [facility]). 12/17/20  Yes [provider]  loperamide (IMODIUM) 2 MG capsule Take 2 mg by mouth as needed for diarrhea or loose stools. 09/11/20  Yes [provider]  metoCLOPramide (REGLAN) 10 MG tablet Take 1 tablet (10 mg  total) by mouth every 8 (eight) hours as needed for nausea (nausea/headache). 02/10/21  Yes Palumbo, April, MD  metoprolol tartrate (LOPRESSOR) 25 MG tablet Take 1 tablet (25 mg total) by mouth 2 (two) times daily. Patient taking differently: Take 25 mg by mouth in the morning. 02/15/20  Yes Dwyane Dee, MD  omeprazole (PRILOSEC) 40 MG capsule Take 1 capsule (40 mg total) by mouth 2 (two) times daily before a meal. Open capsule and place granules in water or applesauce to swallow 01/06/21  Yes Gatha Mayer, MD  ondansetron (ZOFRAN-ODT) 4 MG disintegrating tablet Take 4 mg by  mouth every 8 (eight) hours as needed for vomiting or nausea. 11/04/20  Yes [provider]  oxyCODONE (ROXICODONE) 5 MG immediate release tablet Take 1 tablet (5 mg total) by mouth every 4 (four) hours as needed for severe pain. 02/06/21  Yes Henderly, Britni A, PA-C  silver sulfADIAZINE (SILVADENE) 1 % cream Apply 1 application topically daily. 02/06/21  Yes Henderly, Britni A, PA-C  simvastatin (ZOCOR) 20 MG tablet Take 20 mg by mouth at bedtime.   Yes [provider]  ACCU-CHEK GUIDE test strip USE TO TEST THREE TIMES DAILY FOR GLUCOSE TESTING 12/30/20   [provider]  ACCU-CHEK SOFTCLIX LANCETS lancets Use to check blood sugar 4 times per day. 12/29/15   Renato Shin, MD  BD PEN NEEDLE NANO 2ND GEN 32G X 4 MM MISC 4 (four) times daily. as directed 08/13/20   [provider]  cetirizine (ZYRTEC) 10 MG tablet Take 10 mg by mouth daily.    [provider]  fluticasone (FLONASE) 50 MCG/ACT nasal spray Place 2 sprays into both nostrils daily as needed for allergies. 09/03/18   [name], MD  Insulin Pen Needle (BD PEN NEEDLE NANO 2ND GEN) 32G X 4 MM MISC 1 each by Does not apply route in the morning, at noon, in the evening, and at bedtime. 08/10/20   Barb Merino, MD  Insulin Pen Needle 32G X 4 MM MISC Use as directed 11/06/20   Shelly Coss, MD  lidocaine (XYLOCAINE) 2 % solution Use as directed 15 mLs in the mouth or throat every 6 (six) hours as needed for mouth pain. Patient not taking: Reported on 02/16/2021 11/11/20   Fatima Blank, MD  metoCLOPramide (REGLAN) 10 MG tablet Take 1 tablet (10 mg total) by mouth 4 (four) times daily -  before meals and at bedtime. Patient not taking: Reported on 02/16/2021 12/23/20   Alonza Bogus D, PA-C    Current Facility-Administered Medications  Medication Dose Route Frequency Provider Last Rate Last Admin   0.9 %  sodium chloride infusion   Intravenous Continuous Sharyn Creamer, MD 20 mL/hr at  02/19/21 0734 500 mL at 02/19/21 0734    Allergies as of 02/16/2021 - Review Complete 02/16/2021  Allergen Reaction Noted   Diphenhydramine Anaphylaxis 12/27/2019   Doxycycline Shortness Of Breath 11/20/2015   Motrin [ibuprofen] Shortness Of Breath and Itching 09/17/2010   Contrast media [iodinated diagnostic agents] Itching 09/04/2020   Shellfish allergy Hives 12/22/2012   Banana Itching, Nausea And Vomiting, and Other (See Comments) 12/22/2012   Chlorhexidine Itching 06/15/2019   Ferrous sulfate Itching 07/19/2018   Iron dextran Itching and Other (See Comments) 02/24/2010    Family History  Problem Relation Age of Onset   Arthritis Mother    Hypertension Mother    Aneurysm Mother        died of brain aneurysm   CAD Father  Has 3 stents   Diabetes Father        borderline   Early death Brother        Died in war   Colon cancer Neg Hx    Esophageal cancer Neg Hx    Rectal cancer Neg Hx    Stomach cancer Neg Hx     Social History   Socioeconomic History   Marital status: Single    Spouse name: Not on file   Number of children: Not on file   Years of education: Not on file   Highest education level: Not on file  Occupational History   Occupation: Scientist, 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  Other Topics Concern   Not on file  Social History Narrative   ** Merged History Encounter **       Patient reports that she is single,  Right Handed Drinks caffeine rarely   Social Determinants of Radio broadcast assistant Strain: Not on file  Food Insecurity: Not on file  Transportation Needs: Not on file  Physical Activity: Not on file  Stress: Not on file  Social  Connections: Not on file  Intimate Partner Violence: Not on file    Physical Exam: Vital signs in last 24 hours: BP 139/90    Pulse 97    Temp 99 F (37.2 C) (Oral)    Resp 19    Ht 5\' 6"  (1.676 m)    Wt 79.4 kg    SpO2 98%    BMI 28.25 kg/m  GEN: NAD EYE: Sclerae anicteric ENT: MMM CV: Non-tachycardic Pulm: No increased work of breathing GI: Soft, NT/ND NEURO:  Alert & Oriented   [name], MD [facility]  02/19/2021 7:57 AM

## 2021-02-19 NOTE — H&P (Addendum)
GASTROENTEROLOGY PROCEDURE H&P NOTE   Primary Care Physician: [name], MD    Reason for Procedure:   Dysphagia, recurrent esophageal stricture  Plan:    EGD  Patient is appropriate for endoscopic procedure(s) in the hospital setting  The nature of the procedure, as well as the risks, benefits, and alternatives were carefully and thoroughly reviewed with the patient. Ample time for discussion and questions allowed. The patient understood, was satisfied, and agreed to proceed.     HPI: [name] is a 35 y.o. female who presents for dysphagia for recurrent esophageal stricture. She states that she had some mild improvement in her dysphagia after her last dilation last week with Dr. Henrene Pastor. She is still having dysphagia with occasional vomiting of food back up. She has been losing weight over time. Denies use of blood thinners. Denies caustic ingestions in the past. She states that when she was a child, she had to have dilations as well. She has been compliant with her omeprazole 40 mg BID.  Past Medical History:  Diagnosis Date   Anemia    Blood transfusion without reported diagnosis    Chronic kidney disease    kidney transplant 07   Diabetes mellitus    Pt reports diagnosis in June 2011, Type 2   Diabetes mellitus without complication ([place])    GERD (gastroesophageal reflux disease)    Hyperlipidemia    Hypertension    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   LEARNING DISABILITY 09/25/2007   Qualifier: Diagnosis of  By: Deborra Medina MD, Talia     Pseudoseizures ([place]) 12/22/2012   Pyelonephritis 06/23/2014   Renal disorder    Seasonal allergies    Seizures ([place])    UTI (urinary tract infection) 01/09/2015   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   Qualifier: Diagnosis of  By: Carlena Sax  MD, Colletta [place]      Past Surgical History:  Procedure Laterality Date   ARTERIOVENOUS GRAFT PLACEMENT Bilateral    "neither work" (10/24/2017)   AV FISTULA PLACEMENT Left 10/26/2018    Procedure: CREATION OF ARTERIOVENOUS FISTULA  LEFT ARM;  Surgeon: [name], MD;  Location: [place];  Service: Vascular;  Laterality: Left;   AV FISTULA PLACEMENT Left 05/23/2020   Procedure: LEFT ARM ARTERIOVENOUS (AV) FISTULA CREATION;  Surgeon: [name], MD;  Location: [place];  Service: Vascular;  Laterality: Left;   BALLOON DILATION N/A 01/19/2021   Procedure: Larrie Kass DILATION;  Surgeon: Gatha Mayer, MD;  [facility];  Service: Endoscopy;  Laterality: N/A;   BALLOON DILATION N/A 02/10/2021   Procedure: BALLOON DILATION;  Surgeon: Irene Shipper, MD;  [facility];  Service: Endoscopy;  Laterality: N/A;   BASCILIC VEIN TRANSPOSITION Left 12/21/2018   Procedure: Left arm BASILIC VEIN TRANSPOSITION SECOND STAGE;  Surgeon: [name], MD;  Location: [place];  Service: Vascular;  Laterality: Left;   CHOLECYSTECTOMY N/A 06/30/2017   Procedure: LAPAROSCOPIC CHOLECYSTECTOMY WITH INTRAOPERATIVE CHOLANGIOGRAM;  Surgeon: Excell Seltzer, MD;  [facility];  Service: General;  Laterality: N/A;   ESOPHAGOGASTRODUODENOSCOPY (EGD) WITH PROPOFOL N/A 07/04/2017   Procedure: ESOPHAGOGASTRODUODENOSCOPY (EGD) WITH PROPOFOL;  Surgeon: Clarene Essex, MD;  [facility];  Service: Endoscopy;  Laterality: N/A;   ESOPHAGOGASTRODUODENOSCOPY (EGD) WITH PROPOFOL N/A 08/10/2020   Procedure: ESOPHAGOGASTRODUODENOSCOPY (EGD) WITH PROPOFOL;  Surgeon: Doran Stabler, MD;  Location: [place];  Service: Gastroenterology;  Laterality: N/A;   ESOPHAGOGASTRODUODENOSCOPY (EGD) WITH PROPOFOL N/A 01/19/2021   Procedure: ESOPHAGOGASTRODUODENOSCOPY (EGD) WITH PROPOFOL;  Surgeon: Gatha Mayer, MD;  Location[facility];  Service: Endoscopy;  Laterality: N/A;  WITH FLUOROSCOPY AND DILATION   ESOPHAGOGASTRODUODENOSCOPY (EGD) WITH PROPOFOL N/A 02/03/2021   Procedure: ESOPHAGOGASTRODUODENOSCOPY (EGD) WITH PROPOFOL;  Surgeon: Gatha Mayer, MD;  [facility];   Service: Endoscopy;  Laterality: N/A;   ESOPHAGOGASTRODUODENOSCOPY (EGD) WITH PROPOFOL N/A 02/10/2021   Procedure: ESOPHAGOGASTRODUODENOSCOPY (EGD) WITH PROPOFOL;  Surgeon: Irene Shipper, MD;  [facility];  Service: Endoscopy;  Laterality: N/A;  Balloon Dilation   FOREIGN BODY REMOVAL N/A 02/03/2021   Procedure: FOREIGN BODY REMOVAL;  Surgeon: Gatha Mayer, MD;  [facility];  Service: Endoscopy;  Laterality: N/A;   INSERTION OF DIALYSIS CATHETER N/A 03/20/2018   Procedure: INSERTION OF TUNNELED DIALYSIS CATHETER - RIGHT INTERANL JUGULAR PLACEMENT;  Surgeon: Angelia Mould, MD;  Location: [place];  Service: Vascular;  Laterality: N/A;   IR FLUORO GUIDE CV LINE RIGHT  04/18/2020   IR GUIDED DRAIN W CATHETER PLACEMENT  10/28/2018   KIDNEY TRANSPLANT  2007   KIDNEY TRANSPLANT Right    PARATHYROIDECTOMY  ?2012   "3/4 removed" (10/24/2017)   RENAL BIOPSY Bilateral 2003   REVISON OF ARTERIOVENOUS FISTULA Left 09/24/2020   Procedure: LEFT UPPER ARM ARTERIOVENOUS GRAFT CREATION;  Surgeon: [name], MD;  Location: [place];  Service: Vascular;  Laterality: Left;   UPPER EXTREMITY VENOGRAPHY Bilateral 10/19/2018   Procedure: UPPER EXTREMITY VENOGRAPHY;  Surgeon: [name], MD;  Location: [place] [facility];  Service: Cardiovascular;  Laterality: Bilateral;  Bilateral    UPPER EXTREMITY VENOGRAPHY Left 09/04/2020   Procedure: UPPER EXTREMITY VENOGRAPHY - Left Upper;  Surgeon: [name], MD;  Location: [place] [facility];  Service: Cardiovascular;  Laterality: Left;    Prior to Admission medications   Medication Sig Start Date End Date Taking? Authorizing Provider  acetaminophen (TYLENOL) 500 MG tablet Take 500-1,000 mg by mouth every 6 (six) hours as needed for moderate pain.   Yes [provider]  calcium acetate (PHOSLO) 667 MG capsule Take 1,334 mg by mouth 3 (three) times daily with meals. Take 1-2 capsules (115-7262 mg) by mouth with  snacks & take 2 capsules (1334 mg) by mouth with each meal 10/26/20  Yes [provider]  Calcium Carbonate Antacid (CALCIUM CARBONATE, DOSED IN MG ELEMENTAL CALCIUM,) 1250 MG/5ML SUSP Take 500 mg of elemental calcium by mouth daily. 10/22/20  Yes [provider]  famotidine (PEPCID) 40 MG tablet Take 40 mg by mouth at bedtime. 06/26/20  Yes [provider]  gabapentin (NEURONTIN) 400 MG capsule Take 800-1,200 mg by mouth at bedtime. 12/30/20  Yes [provider]  hydrOXYzine (ATARAX/VISTARIL) 50 MG tablet Take 50 mg by mouth 2 (two) times daily as needed for anxiety. 11/03/20  Yes [provider]  insulin aspart (NOVOLOG FLEXPEN) 100 UNIT/ML FlexPen Inject 0-15 Units into the skin in the morning, at noon, in the evening, and at bedtime. Sliding Scale insulin   Yes [provider]  insulin degludec (TRESIBA) 100 UNIT/ML FlexTouch Pen Inject 20 Units into the skin daily. Patient taking differently: Inject 20 Units into the skin daily. 02/15/20  Yes Dwyane Dee, MD  lidocaine-prilocaine (EMLA) cream Apply 1 application topically daily as needed [facility]). 12/17/20  Yes [provider]  loperamide (IMODIUM) 2 MG capsule Take 2 mg by mouth as needed for diarrhea or loose stools. 09/11/20  Yes [provider]  metoCLOPramide (REGLAN) 10 MG tablet Take 1 tablet (10 mg  total) by mouth every 8 (eight) hours as needed for nausea (nausea/headache). 02/10/21  Yes Palumbo, April, MD  metoprolol tartrate (LOPRESSOR) 25 MG tablet Take 1 tablet (25 mg total) by mouth 2 (two) times daily. Patient taking differently: Take 25 mg by mouth in the morning. 02/15/20  Yes Dwyane Dee, MD  omeprazole (PRILOSEC) 40 MG capsule Take 1 capsule (40 mg total) by mouth 2 (two) times daily before a meal. Open capsule and place granules in water or applesauce to swallow 01/06/21  Yes Gatha Mayer, MD  ondansetron (ZOFRAN-ODT) 4 MG disintegrating tablet Take 4 mg by  mouth every 8 (eight) hours as needed for vomiting or nausea. 11/04/20  Yes [provider]  oxyCODONE (ROXICODONE) 5 MG immediate release tablet Take 1 tablet (5 mg total) by mouth every 4 (four) hours as needed for severe pain. 02/06/21  Yes Henderly, Britni A, PA-C  silver sulfADIAZINE (SILVADENE) 1 % cream Apply 1 application topically daily. 02/06/21  Yes Henderly, Britni A, PA-C  simvastatin (ZOCOR) 20 MG tablet Take 20 mg by mouth at bedtime.   Yes [provider]  ACCU-CHEK GUIDE test strip USE TO TEST THREE TIMES DAILY FOR GLUCOSE TESTING 12/30/20   [provider]  ACCU-CHEK SOFTCLIX LANCETS lancets Use to check blood sugar 4 times per day. 12/29/15   Renato Shin, MD  BD PEN NEEDLE NANO 2ND GEN 32G X 4 MM MISC 4 (four) times daily. as directed 08/13/20   [provider]  cetirizine (ZYRTEC) 10 MG tablet Take 10 mg by mouth daily.    [provider]  fluticasone (FLONASE) 50 MCG/ACT nasal spray Place 2 sprays into both nostrils daily as needed for allergies. 09/03/18   [name], MD  Insulin Pen Needle (BD PEN NEEDLE NANO 2ND GEN) 32G X 4 MM MISC 1 each by Does not apply route in the morning, at noon, in the evening, and at bedtime. 08/10/20   Barb Merino, MD  Insulin Pen Needle 32G X 4 MM MISC Use as directed 11/06/20   Shelly Coss, MD  lidocaine (XYLOCAINE) 2 % solution Use as directed 15 mLs in the mouth or throat every 6 (six) hours as needed for mouth pain. Patient not taking: Reported on 02/16/2021 11/11/20   Fatima Blank, MD  metoCLOPramide (REGLAN) 10 MG tablet Take 1 tablet (10 mg total) by mouth 4 (four) times daily -  before meals and at bedtime. Patient not taking: Reported on 02/16/2021 12/23/20   Alonza Bogus D, PA-C    Current Facility-Administered Medications  Medication Dose Route Frequency Provider Last Rate Last Admin   0.9 %  sodium chloride infusion   Intravenous Continuous Sharyn Creamer, MD 20 mL/hr at  02/19/21 0734 500 mL at 02/19/21 0734    Allergies as of 02/16/2021 - Review Complete 02/16/2021  Allergen Reaction Noted   Diphenhydramine Anaphylaxis 12/27/2019   Doxycycline Shortness Of Breath 11/20/2015   Motrin [ibuprofen] Shortness Of Breath and Itching 09/17/2010   Contrast media [iodinated diagnostic agents] Itching 09/04/2020   Shellfish allergy Hives 12/22/2012   Banana Itching, Nausea And Vomiting, and Other (See Comments) 12/22/2012   Chlorhexidine Itching 06/15/2019   Ferrous sulfate Itching 07/19/2018   Iron dextran Itching and Other (See Comments) 02/24/2010    Family History  Problem Relation Age of Onset   Arthritis Mother    Hypertension Mother    Aneurysm Mother        died of brain aneurysm   CAD Father  Has 3 stents   Diabetes Father        borderline   Early death Brother        Died in war   Colon cancer Neg Hx    Esophageal cancer Neg Hx    Rectal cancer Neg Hx    Stomach cancer Neg Hx     Social History   Socioeconomic History   Marital status: Single    Spouse name: Not on file   Number of children: Not on file   Years of education: Not on file   Highest education level: Not on file  Occupational History   Occupation: Scientist, 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  Other Topics Concern   Not on file  Social History Narrative   ** Merged History Encounter **       Patient reports that she is single,  Right Handed Drinks caffeine rarely   Social Determinants of Radio broadcast assistant Strain: Not on file  Food Insecurity: Not on file  Transportation Needs: Not on file  Physical Activity: Not on file  Stress: Not on file  Social  Connections: Not on file  Intimate Partner Violence: Not on file    Physical Exam: Vital signs in last 24 hours: BP 139/90   Pulse 97   Temp 99 F (37.2 C) (Oral)   Resp 19   Ht 5\' 6"  (1.676 m)   Wt 79.4 kg   SpO2 98%   BMI 28.25 kg/m  GEN: NAD EYE: Sclerae anicteric ENT: MMM CV: Non-tachycardic Pulm: No increased work of breathing GI: Soft, NT/ND NEURO:  Alert & Oriented   [name], MD [facility]  02/19/2021 7:57 AM

## 2021-02-19 NOTE — Transfer of Care (Signed)
Immediate Anesthesia Transfer of Care Note  Patient: [name]  Procedure(s) Performed: ESOPHAGOGASTRODUODENOSCOPY (EGD) WITH PROPOFOL BALLOON DILATION  Patient Location: Endoscopy Unit  Anesthesia Type:MAC  Level of Consciousness: drowsy and patient cooperative  Airway & Oxygen Therapy: Patient Spontanous Breathing and Patient connected to face mask oxygen  Post-op Assessment: Report given to RN and Post -op Vital signs reviewed and stable  Post vital signs: Reviewed and stable  Last Vitals:  Vitals Value Taken Time  BP    Temp    Pulse    Resp    SpO2      Last Pain:  Vitals:   02/19/21 0721  TempSrc: Oral  PainSc: 0-No pain         Complications: No notable events documented.

## 2021-02-19 NOTE — Op Note (Addendum)
[facility] Patient Name: [name] Procedure Date: 02/19/2021 MRN: [id number] Attending MD: Georgian Co ,  Date of Birth: [birth year] CSN: [id number] Age: 35 Admit Type: Outpatient Procedure:                Upper GI endoscopy Indications:              Dysphagia, For therapy of esophageal stricture Providers:                Shelly Coss, RN, Benetta Spar, Technician Referring MD:             Gatha Mayer, MD Medicines:                Monitored Anesthesia Care Complications:            No immediate complications. Estimated Blood Loss:     Estimated blood loss was minimal. Procedure:                Pre-Anesthesia Assessment:                           - Prior to the procedure, a History and Physical                            was performed, and patient medications and                            allergies were reviewed. The patient's tolerance of                            previous anesthesia was also reviewed. The risks                            and benefits of the procedure and the sedation                            options and risks were discussed with the patient.                            All questions were answered, and informed consent                            was obtained. Prior Anticoagulants: The patient has                            taken no previous anticoagulant or antiplatelet                            agents. ASA Grade Assessment: II - A patient with                            mild systemic disease. After reviewing the risks  and benefits, the patient was deemed in                            satisfactory condition to undergo the procedure.                           After obtaining informed consent, the endoscope was                            passed under direct vision. Throughout the                            procedure, the patient's blood pressure, pulse, and                             oxygen saturations were monitored continuously. The                            [id number]-H190 ([id number]) Olympus endoscope was introduced                            through the mouth, and advanced to the second part                            of duodenum. Scope In: Scope Out: Findings:      LA Grade D (one or more mucosal breaks involving at least 75% of       esophageal circumference) esophagitis with no bleeding was found 30 to       35 cm from the incisors.      One benign-appearing, intrinsic moderate (circumferential scarring or       stenosis; an endoscope may pass) stenosis was found in the lower third       of the esophagus. This stenosis measured 5 cm (in length). The stenosis       was traversed. A TTS dilator was passed through the scope. Dilation with       a 12-13.5-15 mm balloon dilator was performed to 15 mm. There was heme       disruption after dilation.      A small hiatal hernia was present.      Localized erythematous mucosa without bleeding was found in the gastric       fundus and in the gastric body.      Food (residue) was found in the gastric body.      The examined duodenum was normal. Impression:               - LA Grade D esophagitis with no bleeding.                           - Benign-appearing esophageal stenosis. Dilated.                           - Small hiatal hernia.                           - Erythematous mucosa in the gastric fundus and  gastric body.                           - Food (residue) in the stomach.                           - Normal examined duodenum.                           - No specimens collected. Moderate Sedation:      Not Applicable - Patient had care per Anesthesia. Recommendation:           - Discharge patient to home (with escort).                           - Use a proton pump inhibitor PO BID.                           - Clear liquid diet today, followed by soft food                             diet (low fat, small meals) tomorrow.                           - Repeat upper endoscopy in 2-4 weeks for                            retreatment, or at a time interval that is                            determined by her primary GI physician Dr. Carlean Purl.                           - The findings and recommendations were discussed                            with the patient. Procedure Code(s):        --- Professional ---                           [phone], Esophagogastroduodenoscopy, flexible,                            transoral; with transendoscopic balloon dilation of                            esophagus (less than 30 mm diameter) Diagnosis Code(s):        --- Professional ---                           K20.90, Esophagitis, unspecified without bleeding                           K22.2, Esophageal obstruction  K44.9, Diaphragmatic hernia without obstruction or                            gangrene                           K31.89, Other diseases of stomach and duodenum                           R13.10, Dysphagia, unspecified CPT copyright 2019 American Medical Association. All rights reserved. The codes documented in this report are preliminary and upon coder review may  be revised to meet current compliance requirements. [name] "[name],  02/19/2021 8:55:27 AM Number of Addenda: 0

## 2021-02-19 NOTE — Anesthesia Preprocedure Evaluation (Addendum)
Anesthesia Evaluation  Patient identified by MRN, date of birth, ID band Patient awake    Reviewed: Allergy & Precautions, NPO status , Patient's Chart, lab work & pertinent test results  Airway Mallampati: I  TM Distance: >3 FB Neck ROM: Full    Dental  (+) Teeth Intact, Dental Advisory Given   Pulmonary    breath sounds clear to auscultation       Cardiovascular hypertension, Pt. on home beta blockers  Rhythm:Regular Rate:Normal  - Gated Cardiac Scan ? There was no ST segment deviation noted during stress. ? The left ventricular ejection fraction is normal (55-65%). ? Nuclear stress EF: 61%. ? The study is normal. ? This is a low risk study.    Neuro/Psych Seizures -,  PSYCHIATRIC DISORDERS    GI/Hepatic Neg liver ROS, hiatal hernia, GERD  Medicated,  Endo/Other  diabetes, Type 2, Insulin Dependent  Renal/GU Dialysis and ESRFRenal disease- s/p Renal Transplant     Musculoskeletal   Abdominal Normal abdominal exam  (+)   Peds  Hematology   Anesthesia Other Findings   Reproductive/Obstetrics                            Anesthesia Physical Anesthesia Plan  ASA: 3  Anesthesia Plan: MAC   Post-op Pain Management:    Induction: Intravenous  PONV Risk Score and Plan: 0 and Propofol infusion  Airway Management Planned: Natural Airway and Simple Face Mask  Additional Equipment: None  Intra-op Plan:   Post-operative Plan:   Informed Consent: I have reviewed the patients History and Physical, chart, labs and discussed the procedure including the risks, benefits and alternatives for the proposed anesthesia with the patient or authorized representative who has indicated his/her understanding and acceptance.       Plan Discussed with: CRNA  Anesthesia Plan Comments: (Lab Results      Component                Value               Date                      WBC                       7.4                 02/10/2021                HGB                      12.0                02/10/2021                HCT                      38.8                02/10/2021                MCV                      90.7                02/10/2021                PLT  186                 02/10/2021            Lab Results      Component                Value               Date                      CREATININE               5.79 (H)            02/10/2021                BUN                      25 (H)              02/10/2021                NA                       139                 02/10/2021                K                        3.9                 02/10/2021                CL                       92 (L)              02/10/2021                CO2                      31                  02/10/2021            CBG (last 3)  No results for input(s): GLUCAP in the last 72 hours. )       Anesthesia Quick Evaluation

## 2021-02-20 ENCOUNTER — Encounter (HOSPITAL_COMMUNITY): Payer: Self-pay | Admitting: Internal Medicine

## 2021-02-23 ENCOUNTER — Telehealth: Payer: Self-pay

## 2021-02-23 ENCOUNTER — Other Ambulatory Visit: Payer: Self-pay

## 2021-02-23 DIAGNOSIS — K2101 Gastro-esophageal reflux disease with esophagitis, with bleeding: Secondary | ICD-10-CM

## 2021-02-23 DIAGNOSIS — K2210 Ulcer of esophagus without bleeding: Secondary | ICD-10-CM

## 2021-02-23 DIAGNOSIS — R1319 Other dysphagia: Secondary | ICD-10-CM

## 2021-02-23 DIAGNOSIS — K222 Esophageal obstruction: Secondary | ICD-10-CM

## 2021-02-23 DIAGNOSIS — K3184 Gastroparesis: Secondary | ICD-10-CM

## 2021-02-23 NOTE — Telephone Encounter (Addendum)
-----   Message from Barney, DO sent at 02/23/2021  4:44 PM EST ----- Regarding: RE: asking for help We can adjust both the EGD/colon's to 60 minutes each and they add on EGD with dilation can then start at 11 AM.  Thanks! ----- Message ----- From: Yevette Edwards, RN Sent: 02/23/2021   4:43 PM EST To: Lavena Bullion, DO Subject: RE: asking for help                            Hello,   I spoke with Sharee Pimple at hospital scheduling. The [facility] block ends at 12 pm, no room to add any additional cases that day.  7:30 am case is EGD/colon for 75 minutes 8:45 am case is EGD/colon for 75 minutes  10:00 am case is EGD/TIF for 90 minutes.   If any of these procedure lengths can be adjusted we may be able to add an 11 am appt.   Let me know what you decide.   Thanks ----- Message ----- From: Lavena Bullion, DO Sent: 02/23/2021   4:29 PM EST To: Gatha Mayer, MD, Yevette Edwards, RN Subject: RE: asking for help                            Happy to help and I think we should be able to make this happen.  [name], can we check with [facility] to see if we can add this EGD with dilation to the schedule for Thursday 12/29? Currently last case is TIF at 10:00, so I think we should be fine on time. Thanks!  VC  ----- Message ----- From: Gatha Mayer, MD Sent: 02/20/2021   7:47 PM EST To: Lavena Bullion, DO Subject: asking for help                                [name],  This lady has a chronic esophagitis w/ stricture - ESRD on HD and also hx gastroparesis  Requiring frequent dilations  Any chance you could scope and balloon dilate her 12/27 at [facility]?  She dialyzes MWF  Thanks and understand if not able  [name]

## 2021-02-23 NOTE — Telephone Encounter (Signed)
Patient has been scheduled for EGD with dilation at [facility] on Thursday, 03/12/21 at 11:15 am. Pt will need to arrive at [facility] by 9:45 am with a care partner. Pt is aware of appt, she has access to her my chart and is aware that I will send her instructions there.

## 2021-02-24 ENCOUNTER — Encounter (HOSPITAL_COMMUNITY): Payer: Self-pay | Admitting: *Deleted

## 2021-02-24 ENCOUNTER — Other Ambulatory Visit: Payer: Self-pay

## 2021-02-24 ENCOUNTER — Encounter: Payer: Medicare Other | Attending: Physician Assistant | Admitting: Physician Assistant

## 2021-02-24 DIAGNOSIS — X102XXA Contact with fats and cooking oils, initial encounter: Secondary | ICD-10-CM | POA: Insufficient documentation

## 2021-02-24 DIAGNOSIS — Y93G3 Activity, cooking and baking: Secondary | ICD-10-CM | POA: Diagnosis not present

## 2021-02-24 DIAGNOSIS — I120 Hypertensive chronic kidney disease with stage 5 chronic kidney disease or end stage renal disease: Secondary | ICD-10-CM | POA: Diagnosis not present

## 2021-02-24 DIAGNOSIS — T25221A Burn of second degree of right foot, initial encounter: Secondary | ICD-10-CM | POA: Insufficient documentation

## 2021-02-24 DIAGNOSIS — E1022 Type 1 diabetes mellitus with diabetic chronic kidney disease: Secondary | ICD-10-CM | POA: Diagnosis not present

## 2021-02-24 DIAGNOSIS — Z992 Dependence on renal dialysis: Secondary | ICD-10-CM | POA: Insufficient documentation

## 2021-02-24 DIAGNOSIS — T25322A Burn of third degree of left foot, initial encounter: Secondary | ICD-10-CM | POA: Diagnosis not present

## 2021-02-24 DIAGNOSIS — N186 End stage renal disease: Secondary | ICD-10-CM | POA: Diagnosis not present

## 2021-02-24 DIAGNOSIS — E1040 Type 1 diabetes mellitus with diabetic neuropathy, unspecified: Secondary | ICD-10-CM | POA: Diagnosis not present

## 2021-02-24 NOTE — Telephone Encounter (Signed)
Ambulatory referral to GI in epic.  Instructions sent to patient via my chart.
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
## 2021-02-25 NOTE — Progress Notes (Signed)
GRAYSEN, DEPAULA ([id number]) Visit Report for 02/24/2021 Abuse/Suicide Risk Screen Details Patient Name: [name], [name] Date of Service: 02/24/2021 12:45 PM Medical Record Number: [id number] Patient Account Number: [id number] Date of Birth/Sex: 09-07-1985 (35 y.o. F) Treating RN: Levora Dredge Primary Care Johnnell Liou: [name] Other Clinician: Referring Willer Osorno: Veatrice Kells Treating Makella Buckingham/Extender: Skipper Cliche in Treatment: 0 Abuse/Suicide Risk Screen Items Answer ABUSE RISK SCREEN: Has anyone close to you tried to hurt or harm you recentlyo No Do you feel uncomfortable with anyone in your familyo No Has anyone forced you do things that you didnot want to doo No Electronic Signature(s) Signed: 02/25/2021 4:05:40 PM By: Levora Dredge Entered By: Levora Dredge on 02/24/2021 13:07:30 [name] ([id number]) -------------------------------------------------------------------------------- Activities of Daily Living Details Patient Name: [name] Date of Service: 02/24/2021 12:45 PM Medical Record Number: [id number] Patient Account Number: [id number] Date of Birth/Sex: [birth year] (35 y.o. F) Treating RN: Levora Dredge Primary Care Breiana Stratmann: [name] Other Clinician: Referring Zorawar Strollo: Veatrice Kells Treating Sevin Farone/Extender: Skipper Cliche in Treatment: 0 Activities of Daily Living Items Answer Activities of Daily Living (Please select one for each item) Drive Automobile Completely Able Take Medications Completely Able Use Telephone Completely Able Care for Appearance Completely Able Use Toilet Completely Able Bath / Shower Completely Able Dress Self Completely Able Feed Self Completely Able Walk Completely Able Get In / Out Bed Completely Able Housework Completely Able Prepare Meals Completely Able Handle Money Completely Able Shop for Self Completely Able Electronic Signature(s) Signed: 02/25/2021 4:05:40 PM By: Levora Dredge Entered By: Levora Dredge on 02/24/2021 13:08:07 [name] ([id number]) -------------------------------------------------------------------------------- Education Screening Details Patient Name: [name] Date of Service: 02/24/2021 12:45 PM Medical Record Number: [id number] Patient Account Number: [id number] Date of Birth/Sex: 29-Jul-1985 (35 y.o. F) Treating RN: Levora Dredge Primary Care Derran Sear: [name] Other Clinician: Referring Jolanda Mccann: Veatrice Kells Treating Mikya Don/Extender: Skipper Cliche in Treatment: 0 Learning Preferences/Education Level/Primary Language Learning Preference: Explanation, Demonstration, Video Highest Education Level: College or Above Preferred Language: English Cognitive Barrier Language Barrier: Yes Translator Needed: Yes Memory Deficit: Yes Emotional Barrier: Yes Cultural/Religious Beliefs Affecting Medical Care: Yes Physical Barrier Impaired Vision: No Impaired Hearing: No Decreased Hand dexterity: No Knowledge/Comprehension Knowledge Level: High Comprehension Level: High Ability to understand written instructions: High Ability to understand verbal instructions: High Motivation Anxiety Level: Calm Cooperation: Cooperative Education Importance: Acknowledges Need Interest in Health Problems: Asks Questions Perception: Coherent Willingness to Engage in Self-Management High Activities: Readiness to Engage in Self-Management High Activities: Electronic Signature(s) Signed: 02/25/2021 4:05:40 PM By: Levora Dredge Entered By: Levora Dredge on 02/24/2021 13:08:46 [name] ([id number]) -------------------------------------------------------------------------------- Fall Risk Assessment Details Patient Name: [name] Date of Service: 02/24/2021 12:45 PM Medical Record Number: [id number] Patient Account Number: [id number] Date of Birth/Sex: [birth year] (35 y.o. F) Treating RN: Levora Dredge Primary Care Dynver Clemson: [name] Other Clinician: Referring Jacalynn Buzzell: Veatrice Kells Treating Brucha Ahlquist/Extender: Skipper Cliche in Treatment: 0 Fall Risk Assessment Items Have you had 2 or more falls in the last 12 monthso 0 No Have you had any fall that resulted in injury in the last 12 monthso 0 No FALLS RISK SCREEN History of falling - immediate or within 3 months 0 No Secondary diagnosis (Do you have 2 or more medical diagnoseso) 0 No Ambulatory aid None/bed rest/wheelchair/nurse 0 Yes Crutches/cane/walker 0 No Furniture 0 No Intravenous therapy Access/Saline/Heparin Lock 0 No Gait/Transferring Normal/ bed rest/ wheelchair 0 Yes Weak (short steps with or without shuffle, stooped but  able to lift head while walking, may 0 No seek support from furniture) Impaired (short steps with shuffle, may have difficulty arising from chair, head down, impaired 0 No balance) Mental Status Oriented to own ability 0 Yes Electronic Signature(s) Signed: 02/25/2021 4:05:40 PM By: Levora Dredge Entered By: Levora Dredge on 02/24/2021 13:09:19 [name] ([id number]) -------------------------------------------------------------------------------- Foot Assessment Details Patient Name: [name] Date of Service: 02/24/2021 12:45 PM Medical Record Number: [id number] Patient Account Number: [id number] Date of Birth/Sex: [birth year] (35 y.o. F) Treating RN: Levora Dredge Primary Care Abbagail Scaff: [name] Other Clinician: Referring Jaiona Simien: Veatrice Kells Treating Haile Bosler/Extender: Skipper Cliche in Treatment: 0 Foot Assessment Items Site Locations + = Sensation present, - = Sensation absent, C = Callus, U = Ulcer R = Redness, W = Warmth, M = Maceration, PU = Pre-ulcerative lesion F = Fissure, S = Swelling, D = Dryness Assessment Right: Left: Other Deformity: No No Prior Foot Ulcer: No No Prior Amputation: No No Charcot Joint: No No Ambulatory Status:  Ambulatory Without Help Gait: Steady Electronic Signature(s) Signed: 02/25/2021 4:05:40 PM By: Levora Dredge Entered By: Levora Dredge on 02/24/2021 13:39:54 [name] ([id number]) -------------------------------------------------------------------------------- Nutrition Risk Screening Details Patient Name: [name] Date of Service: 02/24/2021 12:45 PM Medical Record Number: [id number] Patient Account Number: [id number] Date of Birth/Sex: [birth year] (35 y.o. F) Treating RN: Levora Dredge Primary Care Donye Dauenhauer: [name] Other Clinician: Referring Lauris Serviss: Veatrice Kells Treating Wren Pryce/Extender: Skipper Cliche in Treatment: 0 Height (in): Weight (lbs): Body Mass Index (BMI): Nutrition Risk Screening Items Score Screening NUTRITION RISK SCREEN: I have an illness or condition that made me change the kind and/or amount of food I eat 0 No I eat fewer than two meals per day 0 No I eat few fruits and vegetables, or milk products 0 No I have three or more drinks of beer, liquor or wine almost every day 0 No I have tooth or mouth problems that make it hard for me to eat 0 No I don't always have enough money to buy the food I need 0 No I eat alone most of the time 0 No I take three or more different prescribed or over-the-counter drugs a day 0 No Without wanting to, I have lost or gained 10 pounds in the last six months 0 No I am not always physically able to shop, Park and/or feed myself 0 No Nutrition Protocols Good Risk Protocol 0 No interventions needed Moderate Risk Protocol High Risk Proctocol Risk Level: Good Risk Score: 0 Electronic Signature(s) Signed: 02/25/2021 4:05:40 PM By: Levora Dredge Entered By: Levora Dredge on 02/24/2021 13:10:06

## 2021-02-26 ENCOUNTER — Encounter (HOSPITAL_BASED_OUTPATIENT_CLINIC_OR_DEPARTMENT_OTHER): Payer: Self-pay

## 2021-02-26 ENCOUNTER — Emergency Department (HOSPITAL_COMMUNITY): Admission: EM | Admit: 2021-02-26 | Payer: Medicare Other | Source: Home / Self Care

## 2021-02-26 ENCOUNTER — Other Ambulatory Visit: Payer: Self-pay

## 2021-02-26 ENCOUNTER — Emergency Department (HOSPITAL_BASED_OUTPATIENT_CLINIC_OR_DEPARTMENT_OTHER): Payer: Medicare Other | Admitting: Radiology

## 2021-02-26 ENCOUNTER — Emergency Department (HOSPITAL_BASED_OUTPATIENT_CLINIC_OR_DEPARTMENT_OTHER): Payer: Medicare Other

## 2021-02-26 ENCOUNTER — Emergency Department (HOSPITAL_BASED_OUTPATIENT_CLINIC_OR_DEPARTMENT_OTHER)
Admission: EM | Admit: 2021-02-26 | Discharge: 2021-02-26 | Disposition: A | Discharge status: Home / Self Care | Payer: Medicare Other | Attending: Emergency Medicine | Admitting: Emergency Medicine

## 2021-02-26 ENCOUNTER — Encounter (HOSPITAL_COMMUNITY): Payer: Self-pay | Admitting: *Deleted

## 2021-02-26 DIAGNOSIS — N186 End stage renal disease: Secondary | ICD-10-CM | POA: Diagnosis not present

## 2021-02-26 DIAGNOSIS — E11649 Type 2 diabetes mellitus with hypoglycemia without coma: Secondary | ICD-10-CM | POA: Diagnosis not present

## 2021-02-26 DIAGNOSIS — I120 Hypertensive chronic kidney disease with stage 5 chronic kidney disease or end stage renal disease: Secondary | ICD-10-CM | POA: Insufficient documentation

## 2021-02-26 DIAGNOSIS — R200 Anesthesia of skin: Secondary | ICD-10-CM | POA: Diagnosis not present

## 2021-02-26 DIAGNOSIS — X58XXXD Exposure to other specified factors, subsequent encounter: Secondary | ICD-10-CM | POA: Diagnosis not present

## 2021-02-26 DIAGNOSIS — Z79899 Other long term (current) drug therapy: Secondary | ICD-10-CM | POA: Insufficient documentation

## 2021-02-26 DIAGNOSIS — E1122 Type 2 diabetes mellitus with diabetic chronic kidney disease: Secondary | ICD-10-CM | POA: Diagnosis not present

## 2021-02-26 DIAGNOSIS — R202 Paresthesia of skin: Secondary | ICD-10-CM

## 2021-02-26 DIAGNOSIS — Z940 Kidney transplant status: Secondary | ICD-10-CM | POA: Insufficient documentation

## 2021-02-26 DIAGNOSIS — Z794 Long term (current) use of insulin: Secondary | ICD-10-CM | POA: Insufficient documentation

## 2021-02-26 DIAGNOSIS — S99922D Unspecified injury of left foot, subsequent encounter: Secondary | ICD-10-CM

## 2021-02-26 DIAGNOSIS — E162 Hypoglycemia, unspecified: Secondary | ICD-10-CM

## 2021-02-26 LAB — CBG MONITORING, ED: Glucose-Capillary: 222 mg/dL — ABNORMAL HIGH (ref 70–99)

## 2021-02-26 LAB — BASIC METABOLIC PANEL
Anion gap: 7 (ref 5–15)
BUN: 12 mg/dL (ref 6–20)
CO2: 32 mmol/L (ref 22–32)
Calcium: 8.8 mg/dL — ABNORMAL LOW (ref 8.9–10.3)
Chloride: 96 mmol/L — ABNORMAL LOW (ref 98–111)
Creatinine, Ser: 5.18 mg/dL — ABNORMAL HIGH (ref 0.44–1.00)
GFR, Estimated: 10 mL/min — ABNORMAL LOW (ref >60)
Glucose, Bld: 205 mg/dL — ABNORMAL HIGH (ref 70–99)
Potassium: 3.6 mmol/L (ref 3.5–5.1)
Sodium: 135 mmol/L (ref 135–145)

## 2021-02-26 LAB — MAGNESIUM: Magnesium: 1.9 mg/dL (ref 1.7–2.4)

## 2021-02-26 LAB — CBC
HCT: 26.7 % — ABNORMAL LOW (ref 36.0–46.0)
Hemoglobin: 7.8 g/dL — ABNORMAL LOW (ref 12.0–15.0)
MCH: 27.6 pg (ref 26.0–34.0)
MCHC: 29.2 g/dL — ABNORMAL LOW (ref 30.0–36.0)
MCV: 94.3 fL (ref 80.0–100.0)
Platelets: 281 10*3/uL (ref 150–400)
RBC: 2.83 MIL/uL — ABNORMAL LOW (ref 3.87–5.11)
RDW: 16.9 % — ABNORMAL HIGH (ref 11.5–15.5)
WBC: 6 10*3/uL (ref 4.0–10.5)
nRBC: 0 % (ref 0.0–0.2)

## 2021-02-26 LAB — HCG, SERUM, QUALITATIVE: Preg, Serum: NEGATIVE

## 2021-02-26 MED ORDER — SODIUM CHLORIDE 0.9 % IV BOLUS
500.0000 mL | Freq: Once | INTRAVENOUS | Status: AC
  Administered 2021-02-26: 500 mL via INTRAVENOUS

## 2021-02-26 NOTE — ED Triage Notes (Signed)
Patient states she woke up feeling like the right side of her face was numb. Patient states she felt like she was going to pass so she drank some OJ. Patient not slurring in triage.   Patient took [facility] PM powder 1am, Nyquil 12am

## 2021-02-26 NOTE — Discharge Instructions (Addendum)
Please follow up with neurology as an outpatient regarding your facial numbness  Please follow up with wound care clinic.

## 2021-02-26 NOTE — ED Notes (Signed)
Patient is concerned she may have been having a stroke or headed towards having a seizure. Patient NIH score of 0.

## 2021-02-26 NOTE — ED Notes (Signed)
Pt stated that she fell asleep around 0100 feeling fine and woke up around 0520 with decreased sensation on the right side of her face. Pt is otherwise neurologically intact with no deficits noted on exam. NIH 1 for decreased sensation on the right sided of her face. Pt is not on a blood thinner, M/W/F HD pt, no missed appointments.

## 2021-02-26 NOTE — Progress Notes (Signed)
ON CALL PHONE CONSULT  Call from Dr. Gray@10 :14 AM   Discussion: Isolated facial numbness in the setting of hypoglycemia that resolved with correction of blood glucose.  Follow-up outpatient neurology-low suspicion for this being a TIA.  -- Heather Roberts, MD Neurologist [facility] Pager: [phone]

## 2021-02-26 NOTE — ED Provider Notes (Signed)
[facility] DEPT Provider Note   CSN: [id number] Arrival date & time: 02/26/21  0630     History Chief Complaint  Patient presents with   face numbness    [name] is a 35 y.o. female.  This is a 35 y.o. female with significant medical history as below, including ESRD on hemodialysis Monday Wednesday Friday, last session was yesterday where she did receive her full session.  Who presents to the ED with complaint of tingling, sensation change to the right side of her face.  Patient reports she went to bed last night normal state [facility] woke up this morning around 5:15 AM noticed that her "face felt funny" and was having difficulty speaking, shaking of hands. Her hands were shaky and she thought her blood sugar was low she did not take her morning insulin yet but she did take her nighttime insulin.  Unable to check her blood sugar due to handshaking so she drank 20 ounces of orange juice and was able to check.  Blood sugar which read >100.  She felt much better after drinking the orange juice.  Her speech returned to normal and the shaking resolved after drinking the orange juice.  Still has some residual sensation changes to her face which have improved since the onset.   Of note patient also with recent burn to the plantar aspect of her left foot, she recently ran out of her prescription pain medications and last night took Goody's PM powder and Nyquil to help with her discomfort.  No other acute complaints offered, reports normal state of health prior to last night. No fevers, chills, n/v/d, no change to bowel fxn, does not make urine. No rashes or falls.   The history is provided by the patient. No language interpreter was used.      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   Pyelonephritis 06/23/2014   Renal disorder    Seasonal allergies    Seizures ([place])    UTI (urinary tract infection) 01/09/2015   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   Qualifier: Diagnosis of  By: Carlena Sax  MD, Colletta [place]      Patient Active Problem List   Diagnosis Date Noted   Esophageal dysphagia    Esophageal obstruction due to food impaction    Esophageal stricture    Seizures ([place]) 10/30/2020   Acute upper GI bleed 08/09/2020   [place] grade D esophagitis 08/09/2020   DKA, type 2 ([place]) 07/31/2020   Seizure disorder ([place]) 04/29/2020   Polyneuropathy associated with underlying disease ([place]) 04/29/2020   Renal osteodystrophy 04/22/2020   Hiatal hernia 04/22/2020   Overweight (BMI 25.0-29.9) 04/22/2020   Uncontrolled type 1 diabetes mellitus with hyperglycemia ([place]) 04/20/2020   Hyponatremia 04/20/2020   GERD (gastroesophageal reflux disease) 04/20/2020   Prolonged QT interval 04/20/2020   Wide-complex tachycardia    Allergy, unspecified, initial encounter 11/07/2019   Anaphylactic shock, unspecified, initial encounter 11/07/2019   Hypotension 10/16/2019  Hyperkalemia 10/11/2019   Intractable abdominal pain 06/19/2019   Sinus tachycardia 06/19/2019   Renal and perinephric abscess 11/01/2018   Intra-abdominal abscess ([place]) 10/28/2018   ESRD on hemodialysis ([place]) 05/01/2018   Shortness of breath 04/20/2018   Iron deficiency anemia, unspecified 04/03/2018   Coagulation defect, unspecified ([place]) 15/40/0867   Complication of vascular dialysis catheter 03/27/2018   Dependence on renal dialysis ([place]) 03/27/2018   Kidney transplant failure 03/27/2018   Renal sclerosis, unspecified 03/27/2018   Renal  failure 03/18/2018   Cellulitis of left leg 03/01/2018   Hyperlipidemia 03/01/2018   Hypocalcemia 61/95/0932   Metabolic acidosis 67/02/4579   Incontinence of bowel 02/03/2018   Chronic pain 11/11/2017   Gastroparesis    Constipation 07/13/2017   Hematemesis 07/02/2017   Dehydration 07/02/2017   Chronic cholecystitis 06/29/2017   Type 1 diabetes mellitus with complication, uncontrolled 05/25/2015   Intractable nausea and vomiting 01/09/2015   Renal transplant recipient    Immunosuppressed status ([place])    ESRD (end stage renal disease) ([place]) 09/30/2014   Chest pain 12/22/2012   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   Sleep-wake schedule disorder, 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   Chronic kidney disease 01/04/2010   OVARIAN FAILURE, PREMATURE 03/11/2009   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   OBESITY 09/25/2007   Anemia due to chronic kidney disease 09/25/2007   LEARNING DISABILITY 09/25/2007   Essential hypertension, benign 09/25/2007    Past Surgical History:  Procedure Laterality Date   ARTERIOVENOUS GRAFT PLACEMENT Bilateral    "neither work" (10/24/2017)   AV FISTULA PLACEMENT Left 10/26/2018   Procedure: CREATION OF ARTERIOVENOUS FISTULA  LEFT ARM;  Surgeon: [name], MD;  Location: [facility];  Service: Vascular;  Laterality: Left;   AV FISTULA PLACEMENT Left 05/23/2020   Procedure: LEFT ARM ARTERIOVENOUS (AV) FISTULA CREATION;  Surgeon: [name], MD;  Location: [facility];  Service: Vascular;  Laterality: Left;   BALLOON DILATION N/A 01/19/2021   Procedure: [place];  Surgeon: Gatha Mayer, MD;  [facility]: [facility];  Service: Endoscopy;  Laterality: N/A;   BALLOON DILATION N/A 02/10/2021   Procedure: BALLOON DILATION;  Surgeon: Irene Shipper, MD;  [facility];  Service: Endoscopy;  Laterality: N/A;   BALLOON DILATION N/A 02/19/2021   Procedure: BALLOON DILATION;  Surgeon: Sharyn Creamer, MD;   Location: [facility];  Service: Gastroenterology;  Laterality: N/A;   BASCILIC VEIN TRANSPOSITION Left 12/21/2018   Procedure: Left arm BASILIC VEIN TRANSPOSITION SECOND STAGE;  Surgeon: [name], MD;  Location: [facility];  Service: Vascular;  Laterality: Left;   CHOLECYSTECTOMY N/A 06/30/2017   Procedure: LAPAROSCOPIC CHOLECYSTECTOMY WITH INTRAOPERATIVE CHOLANGIOGRAM;  Surgeon: Excell Seltzer, MD;  [facility];  Service: General;  Laterality: N/A;   ESOPHAGOGASTRODUODENOSCOPY (EGD) WITH PROPOFOL N/A 07/04/2017   Procedure: ESOPHAGOGASTRODUODENOSCOPY (EGD) WITH PROPOFOL;  Surgeon: Clarene Essex, MD;  [facility];  Service: Endoscopy;  Laterality: N/A;   ESOPHAGOGASTRODUODENOSCOPY (EGD) WITH PROPOFOL N/A 08/10/2020   Procedure: ESOPHAGOGASTRODUODENOSCOPY (EGD) WITH PROPOFOL;  Surgeon: Doran Stabler, MD;  Location: [facility];  Service: Gastroenterology;  Laterality: N/A;   ESOPHAGOGASTRODUODENOSCOPY (EGD) WITH PROPOFOL N/A 01/19/2021   Procedure: ESOPHAGOGASTRODUODENOSCOPY (EGD) WITH PROPOFOL;  Surgeon: Gatha Mayer, MD;  [facility];  Service: Endoscopy;  Laterality: N/A;  WITH FLUOROSCOPY AND DILATION   ESOPHAGOGASTRODUODENOSCOPY (EGD) WITH PROPOFOL N/A 02/03/2021   Procedure: ESOPHAGOGASTRODUODENOSCOPY (EGD) WITH PROPOFOL;  Surgeon: Gatha Mayer, MD;  [facility];  Service: Endoscopy;  Laterality: N/A;   ESOPHAGOGASTRODUODENOSCOPY (EGD) WITH PROPOFOL N/A 02/10/2021   Procedure: ESOPHAGOGASTRODUODENOSCOPY (EGD) WITH PROPOFOL;  Surgeon: Irene Shipper, MD;  [facility];  Service: Endoscopy;  Laterality: N/A;  Balloon Dilation   ESOPHAGOGASTRODUODENOSCOPY (EGD) WITH PROPOFOL N/A 02/19/2021   Procedure: ESOPHAGOGASTRODUODENOSCOPY (EGD) WITH PROPOFOL;  Surgeon: Sharyn Creamer, MD;  [facility];  Service: Gastroenterology;  Laterality: N/A;   FOREIGN BODY REMOVAL N/A 02/03/2021   Procedure: FOREIGN BODY REMOVAL;  Surgeon:  Gatha Mayer, MD;  [facility];  Service: Endoscopy;  Laterality: N/A;   INSERTION OF DIALYSIS CATHETER N/A 03/20/2018   Procedure: INSERTION OF TUNNELED DIALYSIS CATHETER - RIGHT INTERANL JUGULAR PLACEMENT;  Surgeon: Angelia Mould, MD;  Location: [facility];  Service: Vascular;  Laterality: N/A;   IR FLUORO GUIDE CV LINE RIGHT  04/18/2020   IR GUIDED DRAIN W CATHETER PLACEMENT  10/28/2018   KIDNEY TRANSPLANT  2007   KIDNEY TRANSPLANT Right    PARATHYROIDECTOMY  ?2012   "3/4 removed" (10/24/2017)   RENAL BIOPSY Bilateral 2003   REVISON OF ARTERIOVENOUS FISTULA Left 09/24/2020   Procedure: LEFT UPPER ARM ARTERIOVENOUS GRAFT CREATION;  Surgeon: [name], MD;  Location: [facility];  Service: Vascular;  Laterality: Left;   UPPER EXTREMITY VENOGRAPHY Bilateral 10/19/2018   Procedure: UPPER EXTREMITY VENOGRAPHY;  Surgeon: [name], MD;  Location: [facility];  Service: Cardiovascular;  Laterality: Bilateral;  Bilateral    UPPER EXTREMITY VENOGRAPHY Left 09/04/2020   Procedure: UPPER EXTREMITY VENOGRAPHY - Left Upper;  Surgeon: [name], MD;  Location: [facility];  Service: Cardiovascular;  Laterality: Left;     OB History   No obstetric history on file.     Family History  Problem Relation Age of Onset   Arthritis Mother    Hypertension Mother    Aneurysm Mother        died of brain aneurysm   CAD Father        Has 3 stents   Diabetes Father        borderline   Early death Brother        Died in war   Colon cancer Neg Hx    Esophageal cancer Neg Hx    Rectal cancer Neg Hx    Stomach cancer Neg Hx     Social History   Tobacco Use   Smoking status: Never    Passive exposure: Never   Smokeless tobacco: Never  Vaping Use   Vaping Use: Never used  Substance Use Topics   Alcohol use: Not Currently   Drug use: Never    Home Medications Prior to Admission medications   Medication Sig Start Date End Date Taking?  Authorizing Provider  ACCU-CHEK GUIDE test strip USE TO TEST THREE TIMES DAILY FOR GLUCOSE TESTING 12/30/20   [provider]  ACCU-CHEK SOFTCLIX LANCETS lancets Use to check blood sugar 4 times per day. 12/29/15   Renato Shin, MD  acetaminophen (TYLENOL) 500 MG tablet Take 500-1,000 mg by mouth every 6 (six) hours as needed for moderate pain.    [provider]  BD PEN NEEDLE NANO 2ND GEN 32G X 4 MM MISC 4 (four) times daily. as directed 08/13/20   [provider]  calcium acetate (PHOSLO) 667 MG capsule Take 1,334 mg by mouth 3 (three) times daily with meals. Take 1-2 capsules [phone] mg) by mouth with snacks & take 2 capsules (1334 mg) by mouth with each meal  10/26/20   [provider]  Calcium Carbonate Antacid (CALCIUM CARBONATE, DOSED IN MG ELEMENTAL CALCIUM,) 1250 MG/5ML SUSP Take 500 mg of elemental calcium by mouth daily. 10/22/20   [provider]  cetirizine (ZYRTEC) 10 MG tablet Take 10 mg by mouth daily.    [provider]  famotidine (PEPCID) 40 MG tablet Take 40 mg by mouth at bedtime. 06/26/20   [provider]  fluticasone (FLONASE) 50 MCG/ACT nasal spray Place 2 sprays into both nostrils daily as needed for allergies. 09/03/18   [name], MD  gabapentin (NEURONTIN) 400 MG capsule Take 800-1,200 mg by mouth at bedtime. 12/30/20   [provider]  hydrOXYzine (ATARAX/VISTARIL) 50 MG tablet Take 50 mg by mouth 2 (two) times daily as needed for anxiety. 11/03/20   [provider]  insulin aspart (NOVOLOG FLEXPEN) 100 UNIT/ML FlexPen Inject 0-15 Units into the skin in the morning, at noon, in the evening, and at bedtime. Sliding Scale insulin    [provider]  insulin degludec (TRESIBA) 100 UNIT/ML FlexTouch Pen Inject 20 Units into the skin daily. Patient taking differently: Inject 20 Units into the skin daily. 02/15/20   Dwyane Dee, MD  Insulin Pen Needle (BD PEN NEEDLE NANO 2ND GEN) 32G X  4 MM MISC 1 each by Does not apply route in the morning, at noon, in the evening, and at bedtime. 08/10/20   Barb Merino, MD  Insulin Pen Needle 32G X 4 MM MISC Use as directed 11/06/20   Shelly Coss, MD  lidocaine (XYLOCAINE) 2 % solution Use as directed 15 mLs in the mouth or throat every 6 (six) hours as needed for mouth pain. Patient not taking: Reported on 02/16/2021 11/11/20   Fatima Blank, MD  lidocaine-prilocaine (EMLA) cream Apply 1 application topically daily as needed [facility]). 12/17/20   [provider]  loperamide (IMODIUM) 2 MG capsule Take 2 mg by mouth as needed for diarrhea or loose stools. 09/11/20   [provider]  metoCLOPramide (REGLAN) 10 MG tablet Take 1 tablet (10 mg total) by mouth every 8 (eight) hours as needed for nausea (nausea/headache). 02/10/21   Palumbo, April, MD  metoprolol tartrate (LOPRESSOR) 25 MG tablet Take 1 tablet (25 mg total) by mouth 2 (two) times daily. Patient taking differently: Take 25 mg by mouth in the morning. 02/15/20   Dwyane Dee, MD  omeprazole (PRILOSEC) 40 MG capsule Take 1 capsule (40 mg total) by mouth 2 (two) times daily before a meal. Open capsule and place granules in water or applesauce to swallow 01/06/21   Gatha Mayer, MD  ondansetron (ZOFRAN-ODT) 4 MG disintegrating tablet Take 4 mg by mouth every 8 (eight) hours as needed for vomiting or nausea. 11/04/20   [provider]  oxyCODONE (ROXICODONE) 5 MG immediate release tablet Take 1 tablet (5 mg total) by mouth every 4 (four) hours as needed for severe pain. 02/06/21   Henderly, Britni A, PA-C  silver sulfADIAZINE (SILVADENE) 1 % cream Apply 1 application topically daily. 02/06/21   Henderly, Britni A, PA-C  simvastatin (ZOCOR) 20 MG tablet Take 20 mg by mouth at bedtime.    [provider]    Allergies    Diphenhydramine, Doxycycline, Motrin [ibuprofen], Contrast media [iodinated diagnostic agents], Shellfish allergy, Banana,  Chlorhexidine, Ferrous sulfate, and Iron dextran  Review of Systems   Review of Systems  Constitutional:  Negative for activity change and fever.  HENT:  Negative for facial swelling and trouble swallowing.  Eyes:  Negative for discharge and redness.  Respiratory:  Negative for cough and shortness of breath.   Cardiovascular:  Negative for chest pain and palpitations.  Gastrointestinal:  Negative for abdominal pain and nausea.  Genitourinary:  Negative for dysuria and flank pain.  Musculoskeletal:  Negative for back pain and gait problem.  Skin:  Negative for pallor and rash.  Neurological:  Positive for speech difficulty and numbness. Negative for syncope and headaches.   Physical Exam Updated Vital Signs BP 119/89 (BP Location: Right Arm)    Pulse 79    Temp 98.4 F (36.9 C)    Resp 16    Ht 5\' 6"  (1.676 m)    Wt 79.4 kg    SpO2 99%    BMI 28.25 kg/m   Physical Exam Vitals and nursing note reviewed.  Constitutional:      General: She is not in acute distress.    Appearance: Normal appearance.  HENT:     Head: Normocephalic and atraumatic.     Right Ear: External ear normal.     Left Ear: External ear normal.     Nose: Nose normal.     Mouth/Throat:     Mouth: Mucous membranes are moist.  Eyes:     General: No scleral icterus.       Right eye: No discharge.        Left eye: No discharge.  Cardiovascular:     Rate and Rhythm: Normal rate and regular rhythm.     Pulses: Normal pulses.     Heart sounds: Normal heart sounds.  Pulmonary:     Effort: Pulmonary effort is normal. No respiratory distress.     Breath sounds: Normal breath sounds.  Abdominal:     General: Abdomen is flat.     Tenderness: There is no abdominal tenderness.  Musculoskeletal:        General: Normal range of motion.       Arms:     Cervical back: Normal range of motion.     Right lower leg: No edema.     Left lower leg: No edema.       Feet:  Skin:    General: Skin is warm and dry.      Capillary Refill: Capillary refill takes less than 2 seconds.  Neurological:     Mental Status: She is alert and oriented to person, place, and time.     GCS: GCS eye subscore is 4. GCS verbal subscore is 5. GCS motor subscore is 6.     Cranial Nerves: Cranial nerves 2-12 are intact. No facial asymmetry.     Sensory: Sensory deficit present.     Motor: Motor function is intact. No weakness, tremor or pronator drift.     Coordination: Coordination is intact. Finger-Nose-Finger Test normal.     Gait: Gait is intact.     Comments: Minimally reduced sensation to V2 present only around her cheek, no facial droop, no sensation change to V1 or V3 area. No dysarthria.   Psychiatric:        Mood and Affect: Mood normal.        Behavior: Behavior normal.    ED Results / Procedures / Treatments   Labs (all labs ordered are listed, but only abnormal results are displayed) Labs Reviewed  BASIC METABOLIC PANEL - Abnormal; Notable for the following components:      Result Value   Chloride 96 (*)    Glucose, Bld 205 (*)  Creatinine, Ser 5.18 (*)    Calcium 8.8 (*)    GFR, Estimated 10 (*)    All other components within normal limits  CBC - Abnormal; Notable for the following components:   RBC 2.83 (*)    Hemoglobin 7.8 (*)    HCT 26.7 (*)    MCHC 29.2 (*)    RDW 16.9 (*)    All other components within normal limits  CBG MONITORING, ED - Abnormal; Notable for the following components:   Glucose-Capillary 222 (*)    All other components within normal limits  HCG, SERUM, QUALITATIVE  MAGNESIUM    EKG EKG Interpretation  Date/Time:  Thursday February 26 2021 07:54:46 EST Ventricular Rate:  81 PR Interval:  173 QRS Duration: 73 QT Interval:  411 QTC Calculation: 478 R Axis:   76 Text Interpretation: Sinus rhythm Similar to prior Confirmed by Wynona Dove ([id number]) on 02/26/2021 8:32:23 AM  Radiology CT Head Wo Contrast  Result Date: 02/26/2021 CLINICAL DATA:  Sensation change to  right side of the face. EXAM: CT HEAD WITHOUT CONTRAST TECHNIQUE: Contiguous axial images were obtained from the base of the skull through the vertex without intravenous contrast. COMPARISON:  02/12/2020 FINDINGS: Brain: No evidence of acute infarction, hemorrhage, hydrocephalus, extra-axial collection or mass lesion/mass effect. Vascular: No hyperdense vessel or unexpected calcification. Skull: Normal. Negative for fracture or focal lesion. Sinuses/Orbits: No acute finding. Other: None. IMPRESSION: No acute intracranial abnormality. Electronically Signed   By: Markus Daft M.D.   On: 02/26/2021 08:27   DG Foot 2 Views Left  Result Date: 02/26/2021 CLINICAL DATA:  Wound EXAM: LEFT FOOT - 2 VIEW COMPARISON:  None. FINDINGS: No acute fracture or dislocation. Evidence osseous destruction or periosteal reaction. Soft tissue swelling and irregularity of the plantar foot. IMPRESSION: Soft tissue swelling and irregularity of the plantar foot with no evidence of underlying osseous abnormality. Electronically Signed   By: Yetta Glassman M.D.   On: 02/26/2021 10:39    Procedures Procedures   Medications Ordered in ED Medications  sodium chloride 0.9 % bolus 500 mL (500 mLs Intravenous New Bag/Given 02/26/21 0841)    ED Course  I have reviewed the triage vital signs and the nursing notes.  Pertinent labs & imaging results that were available during my care of the patient were reviewed by me and considered in my medical decision making (see chart for details).    MDM Rules/Calculators/A&P                           CC: hypoglycemia, facial numbness   This patient complains of above; this involves an extensive number of treatment options and is a complaint that carries with it a high risk of complications and morbidity. Vital signs were reviewed. Serious etiologies considered.  Given significant improvement to her symptoms following juice symptoms are likely attributed to hypoglycemia. She is still  mildly symptomatic with normal glucose will obtain labs and CTH to rule out other underlying pathology, her LKW was >4.5 hours ago.   Record review:  Previous records obtained and reviewed   Work up as above, notable for:  Labs & imaging results that were available during my care of the patient were reviewed by me and considered in my medical decision making.   I ordered imaging studies which included CTH and I independently visualized and interpreted imaging which showed no acute process.  X-ray of the left foot without evidence of osseous abnormalities  Imaging and labs reviewed, no acute process.  Patient reports her symptoms have resolved entirely.  I favor a combination of hypoglycemia, sedating medications as etiology of patient's sensation changes which have now resolved.  I discussed with on-call neurology who recommends patient follow-up in the office as an outpatient.  No further testing recommended at this time.   Patient follows with wound care, next ointment within the next week wound care office.  Wounds were dressed in the emergency department, given wound care supplies for home.  Discussed wound care precautions and treatment at home.  Patient is tolerant oral intake without difficulty.  Reports back to her baseline.  The patient improved significantly and was discharged in stable condition. Detailed discussions were had with the patient regarding current findings, and need for close f/u with PCP or on call doctor. The patient has been instructed to return immediately if the symptoms worsen in any way for re-evaluation. Patient verbalized understanding and is in agreement with current care plan. All questions answered prior to discharge.          This chart was dictated using voice recognition software.  Despite best efforts to proofread,  errors can occur which can change the documentation meaning.   Final Clinical Impression(s) / ED Diagnoses Final diagnoses:   Hypoglycemia  Right facial numbness  Injury of left foot, subsequent encounter    Rx / DC Orders ED Discharge Orders          Ordered    Ambulatory referral to Neurology       Comments: An appointment is requested in approximately: 1 week   02/26/21 [place], Doffing, DO 02/26/21 1539

## 2021-02-26 NOTE — ED Notes (Signed)
PT left per Patient Access

## 2021-02-27 ENCOUNTER — Encounter (HOSPITAL_COMMUNITY): Payer: Self-pay | Admitting: Gastroenterology

## 2021-02-27 NOTE — Progress Notes (Signed)
Attempted to obtain medical history via telephone, unable to reach at this time. Unable to leave voicemail to return pre surgical testing department's phone call,due to mailbox full.  

## 2021-03-05 ENCOUNTER — Encounter: Payer: Medicare Other | Admitting: Physician Assistant

## 2021-03-05 ENCOUNTER — Other Ambulatory Visit: Payer: Self-pay

## 2021-03-05 DIAGNOSIS — T25322A Burn of third degree of left foot, initial encounter: Secondary | ICD-10-CM | POA: Diagnosis not present
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
## 2021-03-05 NOTE — Progress Notes (Addendum)
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 Day/30 Days Discharge Instructions: Apply to wound bed over non-stick dressing. Secondary Dressing:  Kerlix 4.5 x 4.1 (in/yd) (DME) (Generic) Every Other Day/30 Days Discharge Instructions: Apply Kerlix 4.5 x 4.1 (in/yd) as instructed Secured With: 9M Medipore H Soft Cloth Surgical Tape, 2x2 (in/yd) (DME) (Generic) Every Other Day/30 Days 1. I would recommend currently that we actually continue with  the wound care measures as before although we will switch to [facility] discontinuing the Silvadene. 2. I am also can recommend that we have the patient continue with the roll gauze to secure in place which I think is also doing a good job here. 3. I am also going to suggest that we have the patient continue with the Betadine to the toes on the right foot which I think is the right thing to do. 4. With regard to the toes on the left foot I would recommend Hydrofera Blue here. We will see patient back for reevaluation in 2 weeks here in the clinic. If anything worsens or changes patient will contact our office for additional recommendations. [name], [name] ([id number]) Electronic Signature(s) Signed: 03/20/2021 4:50:12 PM By: Worthy [name] PA-C Previous Signature: 03/05/2021 6:08:54 PM Version By: Worthy [name] PA-C Previous Signature: 03/05/2021 6:00:53 PM Version By: Worthy [name] PA-C Entered By: Worthy [name] on 03/20/2021 16:50:12 [name] ([id number]) -------------------------------------------------------------------------------- SuperBill Details Patient Name: [name] Date of Service: 03/05/2021 Medical Record Number: [id number] Patient Account Number: [id number] Date of Birth/Sex: January 25, 1986 (35 y.o. 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 T25.322A Burn of third degree of left foot, initial encounter T25.221A Burn of  second degree of right foot, initial encounter [place] (primary) hypertension Z99.2 Dependence on renal dialysis Facility Procedures CPT4 Code: [id number] Description: 16020 - BURN DRSG W/O ANESTH-SM Modifier: Quantity: 2 CPT4 Code: Description: ICD-10 Diagnosis Description T25.322A Burn of third degree of left foot, initial encounter Modifier: Quantity: Physician Procedures CPT4 Code: [id number] Description: 16020 - WC PHYS DRESS/DEBRID SM,<5% TOT BODY SURF Modifier: Quantity: 2 CPT4 Code: Description: ICD-10 Diagnosis Description T25.322A Burn of third degree of left foot, initial encounter Modifier: Quantity: Electronic Signature(s) Signed: 03/20/2021 4:50:26 PM By: Worthy [name] PA-C Previous Signature: 03/05/2021 6:09:11 PM Version By: Worthy [name] PA-C Entered By: Worthy [name] on 03/20/2021 16:50:26

## 2021-03-12 ENCOUNTER — Ambulatory Visit (HOSPITAL_COMMUNITY)
Admission: RE | Admit: 2021-03-12 | Discharge: 2021-03-12 | Disposition: A | Discharge status: Home / Self Care | Payer: Medicare Other | Source: Ambulatory Visit | Attending: Gastroenterology | Admitting: Gastroenterology

## 2021-03-12 ENCOUNTER — Encounter (HOSPITAL_COMMUNITY)
Admission: RE | Disposition: A | Discharge status: Home / Self Care | Payer: Self-pay | Source: Ambulatory Visit | Attending: Gastroenterology

## 2021-03-12 ENCOUNTER — Ambulatory Visit (HOSPITAL_COMMUNITY): Payer: Medicare Other | Admitting: Anesthesiology

## 2021-03-12 ENCOUNTER — Encounter (HOSPITAL_COMMUNITY): Payer: Self-pay | Admitting: Gastroenterology

## 2021-03-12 ENCOUNTER — Other Ambulatory Visit: Payer: Self-pay

## 2021-03-12 DIAGNOSIS — E1122 Type 2 diabetes mellitus with diabetic chronic kidney disease: Secondary | ICD-10-CM | POA: Diagnosis not present

## 2021-03-12 DIAGNOSIS — Z992 Dependence on renal dialysis: Secondary | ICD-10-CM | POA: Insufficient documentation

## 2021-03-12 DIAGNOSIS — N186 End stage renal disease: Secondary | ICD-10-CM | POA: Diagnosis not present

## 2021-03-12 DIAGNOSIS — Z940 Kidney transplant status: Secondary | ICD-10-CM | POA: Insufficient documentation

## 2021-03-12 DIAGNOSIS — K222 Esophageal obstruction: Secondary | ICD-10-CM

## 2021-03-12 DIAGNOSIS — R634 Abnormal weight loss: Secondary | ICD-10-CM | POA: Diagnosis not present

## 2021-03-12 DIAGNOSIS — E1143 Type 2 diabetes mellitus with diabetic autonomic (poly)neuropathy: Secondary | ICD-10-CM | POA: Insufficient documentation

## 2021-03-12 DIAGNOSIS — K449 Diaphragmatic hernia without obstruction or gangrene: Secondary | ICD-10-CM | POA: Insufficient documentation

## 2021-03-12 DIAGNOSIS — K2100 Gastro-esophageal reflux disease with esophagitis, without bleeding: Secondary | ICD-10-CM | POA: Diagnosis not present

## 2021-03-12 DIAGNOSIS — I120 Hypertensive chronic kidney disease with stage 5 chronic kidney disease or end stage renal disease: Secondary | ICD-10-CM | POA: Insufficient documentation

## 2021-03-12 DIAGNOSIS — Q970 Karyotype 47, XXX: Secondary | ICD-10-CM | POA: Diagnosis not present

## 2021-03-12 DIAGNOSIS — R1310 Dysphagia, unspecified: Secondary | ICD-10-CM | POA: Diagnosis not present

## 2021-03-12 DIAGNOSIS — K3184 Gastroparesis: Secondary | ICD-10-CM | POA: Insufficient documentation

## 2021-03-12 DIAGNOSIS — Z794 Long term (current) use of insulin: Secondary | ICD-10-CM | POA: Diagnosis not present

## 2021-03-12 HISTORY — PX: ESOPHAGOGASTRODUODENOSCOPY (EGD) WITH PROPOFOL: SHX5813

## 2021-03-12 LAB — GLUCOSE, CAPILLARY: Glucose-Capillary: 163 mg/dL — ABNORMAL HIGH (ref 70–99)

## 2021-03-12 SURGERY — ESOPHAGOGASTRODUODENOSCOPY (EGD) WITH PROPOFOL
Anesthesia: Monitor Anesthesia Care

## 2021-03-12 MED ORDER — PROPOFOL 500 MG/50ML IV EMUL
INTRAVENOUS | Status: DC | PRN
  Administered 2021-03-12: 125 ug/kg/min via INTRAVENOUS

## 2021-03-12 MED ORDER — LACTATED RINGERS IV SOLN: INTRAVENOUS | Status: DC

## 2021-03-12 MED ORDER — SODIUM CHLORIDE 0.9 % IV SOLN: INTRAVENOUS | Status: DC | PRN

## 2021-03-12 MED ORDER — FAMOTIDINE 40 MG PO TABS: 40.0000 mg | ORAL_TABLET | Freq: Two times a day (BID) | ORAL | 1 refills | Status: DC

## 2021-03-12 MED ORDER — LIDOCAINE HCL (CARDIAC) PF 100 MG/5ML IV SOSY
PREFILLED_SYRINGE | INTRAVENOUS | Status: DC | PRN
  Administered 2021-03-12: 100 mg via INTRAVENOUS

## 2021-03-12 MED ORDER — PROPOFOL 10 MG/ML IV BOLUS
INTRAVENOUS | Status: DC | PRN
  Administered 2021-03-12: 40 mg via INTRAVENOUS

## 2021-03-12 SURGICAL SUPPLY — 15 items

## 2021-03-12 NOTE — Anesthesia Postprocedure Evaluation (Signed)
Anesthesia Post Note  Patient: [name]  Procedure(s) Performed: ESOPHAGOGASTRODUODENOSCOPY (EGD) WITH PROPOFOL     Patient location during evaluation: PACU Anesthesia Type: MAC Level of consciousness: awake and alert Pain management: pain level controlled Vital Signs Assessment: post-procedure vital signs reviewed and stable Respiratory status: spontaneous breathing, nonlabored ventilation, respiratory function stable and patient connected to nasal cannula oxygen Cardiovascular status: stable and blood pressure returned to baseline Postop Assessment: no apparent nausea or vomiting Anesthetic complications: no   No notable events documented.  Last Vitals:  Vitals:   03/12/21 1258 03/12/21 1300  BP: (!) 106/55 (!) 112/53  Pulse: 92 92  Resp: 16 16  Temp:    SpO2: 95% 96%    Last Pain:  Vitals:   03/12/21 1300  TempSrc:   PainSc: 0-No pain                 Kirin Brandenburger S

## 2021-03-12 NOTE — Anesthesia Preprocedure Evaluation (Signed)
Anesthesia Evaluation  Patient identified by MRN, date of birth, ID band Patient awake    Reviewed: Allergy & Precautions, NPO status , Patient's Chart, lab work & pertinent test results  Airway Mallampati: II  TM Distance: >3 FB Neck ROM: Full    Dental no notable dental hx.    Pulmonary neg pulmonary ROS,    Pulmonary exam normal breath sounds clear to auscultation       Cardiovascular hypertension, Pt. on medications and Pt. on home beta blockers Normal cardiovascular exam Rhythm:Regular Rate:Normal     Neuro/Psych negative neurological ROS  negative psych ROS   GI/Hepatic Neg liver ROS, GERD  Medicated,  Endo/Other  diabetes, Insulin Dependent  Renal/GU DialysisRenal disease  negative genitourinary   Musculoskeletal negative musculoskeletal ROS (+)   Abdominal   Peds negative pediatric ROS (+)  Hematology negative hematology ROS (+)   Anesthesia Other Findings   Reproductive/Obstetrics negative OB ROS                             Anesthesia Physical Anesthesia Plan  ASA: 3  Anesthesia Plan: MAC   Post-op Pain Management: Minimal or no pain anticipated   Induction: Intravenous  PONV Risk Score and Plan: 2 and Propofol infusion and Treatment may vary due to age or medical condition  Airway Management Planned: Nasal Cannula  Additional Equipment:   Intra-op Plan:   Post-operative Plan:   Informed Consent: I have reviewed the patients History and Physical, chart, labs and discussed the procedure including the risks, benefits and alternatives for the proposed anesthesia with the patient or authorized representative who has indicated his/her understanding and acceptance.     Dental advisory given  Plan Discussed with: CRNA and Surgeon  Anesthesia Plan Comments:         Anesthesia Quick Evaluation

## 2021-03-12 NOTE — Op Note (Signed)
[facility] Patient Name: [name] Procedure Date: 03/12/2021 MRN: [id number] Attending MD: Gerrit Heck , MD Date of Birth: 1986/01/31 CSN: [id number] Age: 35 Admit Type: Outpatient Procedure:                Upper GI endoscopy Indications:              Dysphagia, Follow-up of esophageal reflux,                            Stricture of the esophagus, For therapy of                            esophageal stricture                           35 year old female with known history of distal                            esophageal peptic stricture, presents for repeat                            upper endoscopy for ongoing serial dilations. Last                            EGD was 02/19/2021 and notable for 5 cm long                            esophageal stricture in the distal esophagus                            dilated with 15 mm TTS balloon. Endoscopy also                            notable for LA Grade D esophagitis and small hiatal                            hernia. Prior to that EGD 02/10/2021 with 13.5 mm                            TTS balloon with appropriate mucosal rent. Benign                            esophageal bxs in 12/2020 (severe, acute,                            non-specific esophagitis with ulceration). She                            continues to take Prilosec 40 mg twice daily as                            prescribed. Providers:                Gerrit Heck, MD, Jaci Carrel, RN, Cindee Salt  [place] Referring MD:              Medicines:                Monitored Anesthesia Care Complications:            No immediate complications. Estimated Blood Loss:     Estimated blood loss was minimal. Procedure:                Pre-Anesthesia Assessment:                           - Prior to the procedure, a History and Physical                            was performed, and patient medications and                             allergies were reviewed. The patient's tolerance of                            previous anesthesia was also reviewed. The risks                            and benefits of the procedure and the sedation                            options and risks were discussed with the patient.                            All questions were answered, and informed consent                            was obtained. Prior Anticoagulants: The patient has                            taken no previous anticoagulant or antiplatelet                            agents. ASA Grade Assessment: III - A patient with                            severe systemic disease. After reviewing the risks                            and benefits, the patient was deemed in                            satisfactory condition to undergo the procedure.                           After obtaining informed consent, the endoscope was                            passed under direct vision. Throughout the  procedure, the patient's blood pressure, pulse, and                            oxygen saturations were monitored continuously. The                            [id number]-[id number] ([id number]) Olympus endoscope was introduced                            through the mouth, and advanced to the second part                            of duodenum. The upper GI endoscopy was                            accomplished without difficulty. The patient                            tolerated the procedure well. Scope In: Scope Out: Findings:      LA Grade D (one or more mucosal breaks involving at least 75% of       esophageal circumference) esophagitis with no bleeding was found in the       lower third of the esophagus.      One benign-appearing, intrinsic severe stenosis was found 30 to 35 cm       from the incisors. This stenosis measured 5 cm (in length) and 7 mm in       diameter. The stenosis was traversed using gentle endoscope pressure and        air insufflation. There was a good sized, but appropriate, mucosal rent       with passage of the endoscope alone, so additional balloon dilation was       not performed. Estimated blood loss was minimal.      A small hiatal hernia was present.      The entire examined stomach was normal.      The examined duodenum was normal. Impression:               - LA Grade D reflux esophagitis with no bleeding.                           - Benign-appearing esophageal stenosis.                           - Small hiatal hernia.                           - Normal stomach.                           - Normal examined duodenum.                           - No specimens collected. Moderate Sedation:      Not Applicable - Patient had care per Anesthesia. Recommendation:           - Patient has a contact number available for  emergencies. The signs and symptoms of potential                            delayed complications were discussed with the                            patient. Return to normal activities tomorrow.                            Written discharge instructions were provided to the                            patient.                           - Soft diet today then advance slowly as tolerated.                            Continue to cut food into small pieces, and chew                            thoroughly with plenty of fluids with meals.                           - Continue present medications.                           - Await pathology results.                           - Start Pepcid (famotidine) 20 mg PO BID for 4                            weeks to promote additional mucosal healing.                           - Repeat upper endoscopy in 2-3 weeks for                            retreatment. I will discuss with Dr. Carlean Purl. Procedure Code(s):        --- Professional ---                           [phone], Esophagogastroduodenoscopy, flexible,                             transoral; diagnostic, including collection of                            specimen(s) by brushing or washing, when performed                            (separate procedure) Diagnosis Code(s):        --- Professional ---  K21.00, Gastro-esophageal reflux disease with                            esophagitis, without bleeding                           K22.2, Esophageal obstruction                           K44.9, Diaphragmatic hernia without obstruction or                            gangrene                           R13.10, Dysphagia, unspecified CPT copyright 2019 American Medical Association. All rights reserved. The codes documented in this report are preliminary and upon coder review may  be revised to meet current compliance requirements. Gerrit Heck, MD 03/12/2021 12:51:01 PM Number of Addenda: 0

## 2021-03-12 NOTE — Interval H&P Note (Signed)
History and Physical Interval Note:  History of recurrent esophageal stricture.  Most recent EGD on 02/19/2021 notable for LA Grade D esophagitis, 5 cm stenosis in the distal esophagus dilated with 15 mm TTS balloon along with small hiatal hernia.  Reports symptomatic improvement with last dilation, but still with intermittent dysphagia, including dysphagia and food regurgitation after eating a hotdog last week.  Presents today for continued endoscopy with serial dilations of known esophageal stricture.  03/12/2021 12:18 PM  [name]  has presented today for surgery, with the diagnosis of chronic esophagitis w/ stricture - ESRD on HD and also hx gastroparesis.  The various methods of treatment have been discussed with the patient and family. After consideration of risks, benefits and other options for treatment, the patient has consented to  Procedure(s): ESOPHAGOGASTRODUODENOSCOPY (EGD) WITH PROPOFOL (N/A) BALLOON DILATION (N/A) as a surgical intervention.  The patient's history has been reviewed, patient examined, no change in status, stable for surgery.  I have reviewed the patient's chart and labs.  Questions were answered to the patient's satisfaction.     [name]

## 2021-03-12 NOTE — Transfer of Care (Signed)
Immediate Anesthesia Transfer of Care Note  Patient: DENETTE HASS  Procedure(s) Performed: ESOPHAGOGASTRODUODENOSCOPY (EGD) WITH PROPOFOL  Patient Location: PACU and Endoscopy Unit  Anesthesia Type:MAC  Level of Consciousness: awake and drowsy  Airway & Oxygen Therapy: Patient Spontanous Breathing and Patient connected to face mask oxygen  Post-op Assessment: Report given to RN and Post -op Vital signs reviewed and stable  Post vital signs: Reviewed and stable  Last Vitals:  Vitals Value Taken Time  BP    Temp    Pulse 90 03/12/21 1250  Resp 20 03/12/21 1250  SpO2 100 % 03/12/21 1250  Vitals shown include unvalidated device data.  Last Pain:  Vitals:   03/12/21 0951  PainSc: 0-No pain         Complications: No notable events documented.

## 2021-03-12 NOTE — Discharge Instructions (Addendum)
YOU HAD AN ENDOSCOPIC PROCEDURE TODAY: Refer to the procedure report and other information in the discharge instructions given to you for any specific questions about what was found during the examination. If this information does not answer your questions, please call [place] office at [phone] to clarify.   YOU SHOULD EXPECT: Some feelings of bloating in the abdomen. Passage of more gas than usual. Walking can help get rid of the air that was put into your GI tract during the procedure and reduce the bloating. If you had a lower endoscopy (such as a colonoscopy or flexible sigmoidoscopy) you may notice spotting of blood in your stool or on the toilet paper. Some abdominal soreness may be present for a day or two, also.  DIET: Your first meal following the procedure should be a light meal and then it is ok to progress to your normal diet. A half-sandwich or bowl of soup is an example of a good first meal. Heavy or fried foods are harder to digest and may make you feel nauseous or bloated. Drink plenty of fluids but you should avoid alcoholic beverages for 24 hours. If you had a esophageal dilation, please see attached instructions for diet.    ACTIVITY: Your care partner should take you home directly after the procedure. You should plan to take it easy, moving slowly for the rest of the day. You can resume normal activity the day after the procedure however YOU SHOULD NOT DRIVE, use power tools, machinery or perform tasks that involve climbing or major physical exertion for 24 hours (because of the sedation medicines used during the test).   SYMPTOMS TO REPORT IMMEDIATELY: A gastroenterologist can be reached at any hour. Please call [phone]  for any of the following symptoms:  Following lower endoscopy (colonoscopy, flexible sigmoidoscopy) Excessive amounts of blood in the stool  Significant tenderness, worsening of abdominal pains  Swelling of the abdomen that is new, acute  Fever of 100 or  higher  Following upper endoscopy (EGD, EUS, ERCP, esophageal dilation) Vomiting of blood or coffee ground material  New, significant abdominal pain  New, significant chest pain or pain under the shoulder blades  Painful or persistently difficult swallowing  New shortness of breath  Black, tarry-looking or red, bloody stools  FOLLOW UP:  If any biopsies were taken you will be contacted by phone or by letter within the next 1-3 weeks. Call [phone]  if you have not heard about the biopsies in 3 weeks.  Please also call with any specific questions about appointments or follow up tests.      [facility] ENDOSCOPY [address] [place], [place]  [address] Phone:  [phone]   March 12, 2021  Patient: [name]  Date of Birth: 1985/03/24  Date of Visit: March 12, 2021    To Whom It May Concern:  [name] was seen and treated on March 12, 2021 and [name] was her care partner and brought her to and from the hospital for her procedure and was with her for rest of day.    .           If you have any questions or concerns, please don't hesitate to call [phone]   Sincerely,   Tory Emerald    Treatment Team:  Attending Provider: Lavena Bullion, DO

## 2021-03-13 ENCOUNTER — Encounter (HOSPITAL_COMMUNITY): Payer: Self-pay | Admitting: Gastroenterology

## 2021-03-13 LAB — POCT I-STAT, CHEM 8
BUN: 21 mg/dL — ABNORMAL HIGH (ref 6–20)
Calcium, Ion: 1.18 mmol/L (ref 1.15–1.40)
Chloride: 101 mmol/L (ref 98–111)
Creatinine, Ser: 6 mg/dL — ABNORMAL HIGH (ref 0.44–1.00)
Glucose, Bld: 192 mg/dL — ABNORMAL HIGH (ref 70–99)
HCT: 32 % — ABNORMAL LOW (ref 36.0–46.0)
Hemoglobin: 10.9 g/dL — ABNORMAL LOW (ref 12.0–15.0)
Potassium: 4.6 mmol/L (ref 3.5–5.1)
Sodium: 138 mmol/L (ref 135–145)
TCO2: 27 mmol/L (ref 22–32)

## 2021-03-15 ENCOUNTER — Encounter (HOSPITAL_COMMUNITY): Payer: Self-pay

## 2021-03-15 ENCOUNTER — Encounter (HOSPITAL_COMMUNITY): Payer: Self-pay | Admitting: *Deleted

## 2021-03-15 ENCOUNTER — Emergency Department (HOSPITAL_COMMUNITY)
Admission: EM | Admit: 2021-03-15 | Discharge: 2021-03-15 | Disposition: A | Discharge status: Home / Self Care | Payer: Medicare Other | Attending: Emergency Medicine | Admitting: Emergency Medicine

## 2021-03-15 ENCOUNTER — Emergency Department (HOSPITAL_BASED_OUTPATIENT_CLINIC_OR_DEPARTMENT_OTHER)
Admission: EM | Admit: 2021-03-15 | Discharge: 2021-03-15 | Disposition: A | Discharge status: Home / Self Care | Payer: Medicare Other | Source: Home / Self Care | Attending: Emergency Medicine | Admitting: Emergency Medicine

## 2021-03-15 ENCOUNTER — Other Ambulatory Visit: Payer: Self-pay

## 2021-03-15 DIAGNOSIS — N186 End stage renal disease: Secondary | ICD-10-CM | POA: Insufficient documentation

## 2021-03-15 DIAGNOSIS — R710 Precipitous drop in hematocrit: Secondary | ICD-10-CM | POA: Diagnosis present

## 2021-03-15 DIAGNOSIS — R000 Tachycardia, unspecified: Secondary | ICD-10-CM | POA: Diagnosis not present

## 2021-03-15 DIAGNOSIS — R5383 Other fatigue: Secondary | ICD-10-CM | POA: Diagnosis not present

## 2021-03-15 DIAGNOSIS — R899 Unspecified abnormal finding in specimens from other organs, systems and tissues: Secondary | ICD-10-CM

## 2021-03-15 DIAGNOSIS — Z79899 Other long term (current) drug therapy: Secondary | ICD-10-CM | POA: Insufficient documentation

## 2021-03-15 DIAGNOSIS — D631 Anemia in chronic kidney disease: Secondary | ICD-10-CM | POA: Insufficient documentation

## 2021-03-15 DIAGNOSIS — Z794 Long term (current) use of insulin: Secondary | ICD-10-CM | POA: Insufficient documentation

## 2021-03-15 DIAGNOSIS — Z992 Dependence on renal dialysis: Secondary | ICD-10-CM | POA: Insufficient documentation

## 2021-03-15 LAB — POC OCCULT BLOOD, ED: Fecal Occult Bld: NEGATIVE

## 2021-03-15 LAB — CBC WITH DIFFERENTIAL/PLATELET
Abs Immature Granulocytes: 0.03 10*3/uL (ref 0.00–0.07)
Basophils Absolute: 0.1 10*3/uL (ref 0.0–0.1)
Basophils Relative: 1 %
Eosinophils Absolute: 0.4 10*3/uL (ref 0.0–0.5)
Eosinophils Relative: 5 %
HCT: 29.4 % — ABNORMAL LOW (ref 36.0–46.0)
Hemoglobin: 8.4 g/dL — ABNORMAL LOW (ref 12.0–15.0)
Immature Granulocytes: 0 %
Lymphocytes Relative: 31 %
Lymphs Abs: 2.5 10*3/uL (ref 0.7–4.0)
MCH: 26.4 pg (ref 26.0–34.0)
MCHC: 28.6 g/dL — ABNORMAL LOW (ref 30.0–36.0)
MCV: 92.5 fL (ref 80.0–100.0)
Monocytes Absolute: 0.6 10*3/uL (ref 0.1–1.0)
Monocytes Relative: 8 %
Neutro Abs: 4.4 10*3/uL (ref 1.7–7.7)
Neutrophils Relative %: 55 %
Platelets: 302 10*3/uL (ref 150–400)
RBC: 3.18 MIL/uL — ABNORMAL LOW (ref 3.87–5.11)
RDW: 17.5 % — ABNORMAL HIGH (ref 11.5–15.5)
WBC: 8 10*3/uL (ref 4.0–10.5)
nRBC: 0 % (ref 0.0–0.2)

## 2021-03-15 LAB — BASIC METABOLIC PANEL
Anion gap: 12 (ref 5–15)
BUN: 29 mg/dL — ABNORMAL HIGH (ref 6–20)
CO2: 27 mmol/L (ref 22–32)
Calcium: 9.3 mg/dL (ref 8.9–10.3)
Chloride: 97 mmol/L — ABNORMAL LOW (ref 98–111)
Creatinine, Ser: 8.01 mg/dL — ABNORMAL HIGH (ref 0.44–1.00)
GFR, Estimated: 6 mL/min — ABNORMAL LOW (ref >60)
Glucose, Bld: 214 mg/dL — ABNORMAL HIGH (ref 70–99)
Potassium: 4.3 mmol/L (ref 3.5–5.1)
Sodium: 136 mmol/L (ref 135–145)

## 2021-03-15 MED ORDER — SODIUM CHLORIDE 0.9 % IV SOLN: 10.0000 mL/h | Freq: Once | INTRAVENOUS | Status: DC

## 2021-03-15 NOTE — ED Notes (Signed)
ED Provider at bedside. 

## 2021-03-15 NOTE — ED Triage Notes (Signed)
Pt presents after leaving [name] and [name] earlier today. Pt states she was at dialysis Friday and was told her Hgb was 7.4. Pt denies dizziness, SOB, hematochezia. Pt with hx of anemia, states she has received multiple blood transfusions in the past. Per pt states she had procedure on 12/29 to see if she had esophageal bleeding.

## 2021-03-15 NOTE — ED Notes (Signed)
EMT-P provided AVS using Teachback Method. Patient verbalizes understanding of Discharge Instructions. Opportunity for Questioning and Answers were provided by EMT-P. Patient Discharged from ED.  ? ?

## 2021-03-15 NOTE — ED Notes (Signed)
Patient does not want RN to stick patient without [place] machine. Another nurse notified to try with [place].

## 2021-03-15 NOTE — ED Triage Notes (Signed)
Patient reports that she had a Hgb 7.4. Patient denies any SOB, CP.

## 2021-03-15 NOTE — ED Provider Notes (Signed)
[facility] DEPT Provider Note   CSN: [id number] Arrival date & time: 03/15/21  5638     History  Chief Complaint  Patient presents with   Abnormal Lab    [name] is a 36 y.o. female.  HPI  This is a 36 year old female with end-stage renal disease on dialysis Monday, Wednesday, Friday with history of symptomatic anemia, esophageal stricture requiring dilation, presenting today with low hemoglobin.  Patient is currently asymptomatic, she reports she got a call from her nephrologist language go to ED for transfusion because her hemoglobin was low.  She does report feeling fatigued but denies any syncope, palpitations, chest pain, shortness of breath.  No abdominal pain.  Has not noticed any rectal bleeding.  Home Medications Prior to Admission medications   Medication Sig Start Date End Date Taking? Authorizing Provider  ACCU-CHEK GUIDE test strip USE TO TEST THREE TIMES DAILY FOR GLUCOSE TESTING 12/30/20   [provider]  ACCU-CHEK SOFTCLIX LANCETS lancets Use to check blood sugar 4 times per day. 12/29/15   Renato Shin, MD  acetaminophen (TYLENOL) 500 MG tablet Take 500-1,000 mg by mouth every 6 (six) hours as needed for moderate pain.    [provider]  BD PEN NEEDLE NANO 2ND GEN 32G X 4 MM MISC 4 (four) times daily. as directed 08/13/20   [provider]  calcium acetate (PHOSLO) 667 MG capsule Take 1,334 mg by mouth 3 (three) times daily with meals. Take 1-2 capsules (937-3428 mg) by mouth with snacks & take 2 capsules (1334 mg) by mouth with each meal 10/26/20   [provider]  Calcium Carbonate Antacid (CALCIUM CARBONATE, DOSED IN MG ELEMENTAL CALCIUM,) 1250 MG/5ML SUSP Take 500 mg of elemental calcium by mouth daily. 10/22/20   [provider]  cetirizine (ZYRTEC) 10 MG tablet Take 10 mg by mouth daily.    [provider]  doxycycline (VIBRA-TABS) 100 MG tablet Take 100 mg by mouth 2 (two)  times daily. 02/20/21   [provider]  famotidine (PEPCID) 40 MG tablet Take 1 tablet (40 mg total) by mouth 2 (two) times daily. 03/12/21   Cirigliano, Vito V, DO  fluticasone (FLONASE) 50 MCG/ACT nasal spray Place 2 sprays into both nostrils daily as needed for allergies. 09/03/18   [name], MD  gabapentin (NEURONTIN) 400 MG capsule Take 800-1,200 mg by mouth at bedtime. 12/30/20   [provider]  hydrOXYzine (ATARAX/VISTARIL) 50 MG tablet Take 50 mg by mouth 2 (two) times daily as needed for anxiety. 11/03/20   [provider]  insulin aspart (NOVOLOG FLEXPEN) 100 UNIT/ML FlexPen Inject 0-15 Units into the skin in the morning, at noon, in the evening, and at bedtime. Sliding Scale insulin    [provider]  insulin degludec (TRESIBA) 100 UNIT/ML FlexTouch Pen Inject 20 Units into the skin daily. Patient taking differently: Inject 20 Units into the skin daily. 02/15/20   Dwyane Dee, MD  Insulin Pen Needle (BD PEN NEEDLE NANO 2ND GEN) 32G X 4 MM MISC 1 each by Does not apply route in the morning, at noon, in the evening, and at bedtime. 08/10/20   Barb Merino, MD  Insulin Pen Needle 32G X 4 MM MISC Use as directed 11/06/20   Shelly Coss, MD  lidocaine (XYLOCAINE) 2 % solution Use as directed 15 mLs in the mouth or throat every 6 (six) hours as needed for mouth pain. Patient not taking: Reported on 02/16/2021 11/11/20   Addison Lank  Johnsie Cancel, MD  lidocaine-prilocaine (EMLA) cream Apply 1 application topically daily as needed [facility]). 12/17/20   [provider]  loperamide (IMODIUM) 2 MG capsule Take 2 mg by mouth as needed for diarrhea or loose stools. 09/11/20   [provider]  metoCLOPramide (REGLAN) 10 MG tablet Take 1 tablet (10 mg total) by mouth every 8 (eight) hours as needed for nausea (nausea/headache). 02/10/21   Palumbo, April, MD  metoprolol tartrate (LOPRESSOR) 25 MG tablet Take 1 tablet (25 mg total) by mouth 2 (two)  times daily. Patient taking differently: Take 25 mg by mouth in the morning. 02/15/20   Dwyane Dee, MD  omeprazole (PRILOSEC) 40 MG capsule Take 1 capsule (40 mg total) by mouth 2 (two) times daily before a meal. Open capsule and place granules in water or applesauce to swallow 01/06/21   Gatha Mayer, MD  ondansetron (ZOFRAN-ODT) 4 MG disintegrating tablet Take 4 mg by mouth every 8 (eight) hours as needed for vomiting or nausea. 11/04/20   [provider]  oxyCODONE (ROXICODONE) 5 MG immediate release tablet Take 1 tablet (5 mg total) by mouth every 4 (four) hours as needed for severe pain. 02/06/21   Henderly, Britni A, PA-C  oxyCODONE-acetaminophen (PERCOCET/ROXICET) 5-325 MG tablet Take 0.25 tablets by mouth 2 (two) times daily as needed (leg pain). 02/20/21   [provider]  silver sulfADIAZINE (SILVADENE) 1 % cream Apply 1 application topically daily. 02/06/21   Henderly, Britni A, PA-C  simvastatin (ZOCOR) 20 MG tablet Take 20 mg by mouth at bedtime.    [provider]      Allergies    Diphenhydramine, Motrin [ibuprofen], Contrast media [iodinated contrast media], Shellfish allergy, Banana, Chlorhexidine, Ferrous sulfate, and Iron dextran    Review of Systems   Review of Systems  Constitutional:  Positive for fatigue. Negative for fever.  Respiratory:  Negative for shortness of breath.   Cardiovascular:  Negative for chest pain and palpitations.  Gastrointestinal:  Negative for abdominal pain, anal bleeding, blood in stool, nausea and vomiting.  Genitourinary:  Negative for dysuria.  Neurological:  Negative for syncope.   Physical Exam Updated Vital Signs BP 118/86 (BP Location: Right Arm)    Pulse (!) 103    Temp 98.8 F (37.1 C) (Oral)    Resp 20    Ht 5\' 6"  (1.676 m)    Wt 79.4 kg    SpO2 98%    BMI 28.25 kg/m  Physical Exam Vitals and nursing note reviewed. Exam conducted with a chaperone present.  Constitutional:      Appearance: Normal  appearance.  HENT:     Head: Normocephalic and atraumatic.  Eyes:     General: No scleral icterus.       Right eye: No discharge.        Left eye: No discharge.     Extraocular Movements: Extraocular movements intact.     Pupils: Pupils are equal, round, and reactive to light.     Comments: Pale conjunctiva   Cardiovascular:     Rate and Rhythm: Regular rhythm. Tachycardia present.     Pulses: Normal pulses.     Heart sounds: Normal heart sounds. No murmur heard.   No friction rub. No gallop.  Pulmonary:     Effort: Pulmonary effort is normal. No respiratory distress.     Breath sounds: Normal breath sounds.  Abdominal:     General: Abdomen is flat. Bowel sounds are normal. There is no distension.  Palpations: Abdomen is soft.     Tenderness: There is no abdominal tenderness.  Skin:    General: Skin is warm and dry.     Coloration: Skin is pale. Skin is not jaundiced.  Neurological:     Mental Status: She is alert. Mental status is at baseline.     Coordination: Coordination normal.   ED Results / Procedures / Treatments   Labs (all labs ordered are listed, but only abnormal results are displayed) Labs Reviewed  RESP PANEL BY RT-PCR (FLU A&B, COVID) ARPGX2  BASIC METABOLIC PANEL  CBC WITH DIFFERENTIAL/PLATELET  OCCULT BLOOD X 1 CARD TO LAB, STOOL  I-STAT CHEM 8, ED  TYPE AND SCREEN  PREPARE RBC (CROSSMATCH)    EKG None  Radiology No results found.  Procedures Procedures    Medications Ordered in ED Medications  0.9 %  sodium chloride infusion (has no administration in time range)    ED Course/ Medical Decision Making/ A&P                           Medical Decision Making  [name] is a 36 year old female presenting due to abnormal lab value.  She had a hemoglobin of 7.4 about a week ago with her nephrologist.  She is asymptomatic other than fatigue which has been going on for months.  She is fecal occult negative, we were unable to obtain labs due to  access issues.  IV team consulted.  Patient states she is unwilling to wait for IV team.  She is not agreeable to nursing trying a second attempt.  I discussed the risk with her of leaving now including syncope, worsenin blood loss, poor outcomes.  Patient verbalized understanding and states she will return as needed and contact the nephrologist.    She is hemodynamically stable.  She is with slightly elevated heart rate but her blood pressure is stable and she is not hypotensive.  She is not hypoxic, she has not in acute distress at the moment.  Patient discharged in stable condition.         Final Clinical Impression(s) / ED Diagnoses Final diagnoses:  None    Rx / DC Orders ED Discharge Orders     None         Sherrill Raring, Hershal Coria 03/15/21 1300    Lacretia Leigh, MD 03/18/21 1311

## 2021-03-15 NOTE — Discharge Instructions (Signed)
Please return back for transfusion when you are able. Called the nephrologist with recommendations on if there is an outpatient facility can go to.

## 2021-03-15 NOTE — Discharge Instructions (Addendum)
Your hemoglobin here today is 8.4.  We cannot give you a blood transfusion here today, however feel that you have time to get this morning.  Please talk with your kidney doctor about how to get this scheduled.  Please return to the emergency department if you notice blood in the stool, vomit or other heavy bleeding.  Please return if you have worsening shortness of breath or if you pass out or feel extremely lightheaded with standing.

## 2021-03-15 NOTE — ED Notes (Signed)
Another RN states patient would not let patient attempt [place] IV stating she could not use certain IV size. EDP made aware.

## 2021-03-15 NOTE — ED Provider Notes (Signed)
[facility] EMERGENCY DEPT Provider Note   CSN: [id number] Arrival date & time: 03/15/21  1142     History  Chief Complaint  Patient presents with   Anemia    [name] is a 36 y.o. female.  Patient with history of end-stage renal disease, previous kidney transplant, resumed dialysis in 2021 --presents to the emergency department because she was called and told to come in for a blood transfusion.  Her hemoglobin was reportedly 7.4.  She has received transfusions in the past.  Currently feels fatigue and no other symptoms.  She reports recent serial esophageal dilations due to recurrent stricture.  No bloody or black stools.  No lightheadedness or syncope.  No shortness of breath.  Patient was seen at [facility] emergency department earlier this morning and left before receiving blood transfusion due to access issues. Hemoccult there was negative.  Patient does report craving of ice, spinach.       Home Medications Prior to Admission medications   Medication Sig Start Date End Date Taking? Authorizing Provider  ACCU-CHEK GUIDE test strip USE TO TEST THREE TIMES DAILY FOR GLUCOSE TESTING 12/30/20   [provider]  ACCU-CHEK SOFTCLIX LANCETS lancets Use to check blood sugar 4 times per day. 12/29/15   Renato Shin, MD  acetaminophen (TYLENOL) 500 MG tablet Take 500-1,000 mg by mouth every 6 (six) hours as needed for moderate pain.    [provider]  BD PEN NEEDLE NANO 2ND GEN 32G X 4 MM MISC 4 (four) times daily. as directed 08/13/20   [provider]  calcium acetate (PHOSLO) 667 MG capsule Take 1,334 mg by mouth 3 (three) times daily with meals. Take 1-2 capsules (782-9562 mg) by mouth with snacks & take 2 capsules (1334 mg) by mouth with each meal 10/26/20   [provider]  Calcium Carbonate Antacid (CALCIUM CARBONATE, DOSED IN MG ELEMENTAL CALCIUM,) 1250 MG/5ML SUSP Take 500 mg of elemental calcium by mouth daily. 10/22/20    [provider]  cetirizine (ZYRTEC) 10 MG tablet Take 10 mg by mouth daily.    [provider]  doxycycline (VIBRA-TABS) 100 MG tablet Take 100 mg by mouth 2 (two) times daily. 02/20/21   [provider]  famotidine (PEPCID) 40 MG tablet Take 1 tablet (40 mg total) by mouth 2 (two) times daily. 03/12/21   Cirigliano, Vito V, DO  fluticasone (FLONASE) 50 MCG/ACT nasal spray Place 2 sprays into both nostrils daily as needed for allergies. 09/03/18   [name], MD  gabapentin (NEURONTIN) 400 MG capsule Take 800-1,200 mg by mouth at bedtime. 12/30/20   [provider]  hydrOXYzine (ATARAX/VISTARIL) 50 MG tablet Take 50 mg by mouth 2 (two) times daily as needed for anxiety. 11/03/20   [provider]  insulin aspart (NOVOLOG FLEXPEN) 100 UNIT/ML FlexPen Inject 0-15 Units into the skin in the morning, at noon, in the evening, and at bedtime. Sliding Scale insulin    [provider]  insulin degludec (TRESIBA) 100 UNIT/ML FlexTouch Pen Inject 20 Units into the skin daily. Patient taking differently: Inject 20 Units into the skin daily. 02/15/20   Dwyane Dee, MD  Insulin Pen Needle (BD PEN NEEDLE NANO 2ND GEN) 32G X 4 MM MISC 1 each by Does not apply route in the morning, at noon, in the evening, and at bedtime. 08/10/20   Barb Merino, MD  Insulin Pen Needle 32G X 4 MM MISC Use as directed 11/06/20   Shelly Coss, MD  lidocaine (XYLOCAINE) 2 % solution Use as directed 15 mLs in the mouth or throat every 6 (six) hours as needed for mouth pain. Patient not taking: Reported on 02/16/2021 11/11/20   Fatima Blank, MD  lidocaine-prilocaine (EMLA) cream Apply 1 application topically daily as needed [facility]). 12/17/20   [provider]  loperamide (IMODIUM) 2 MG capsule Take 2 mg by mouth as needed for diarrhea or loose stools. 09/11/20   [provider]  metoCLOPramide (REGLAN) 10 MG tablet Take 1 tablet (10 mg total) by mouth  every 8 (eight) hours as needed for nausea (nausea/headache). 02/10/21   Palumbo, April, MD  metoprolol tartrate (LOPRESSOR) 25 MG tablet Take 1 tablet (25 mg total) by mouth 2 (two) times daily. Patient taking differently: Take 25 mg by mouth in the morning. 02/15/20   Dwyane Dee, MD  omeprazole (PRILOSEC) 40 MG capsule Take 1 capsule (40 mg total) by mouth 2 (two) times daily before a meal. Open capsule and place granules in water or applesauce to swallow 01/06/21   Gatha Mayer, MD  ondansetron (ZOFRAN-ODT) 4 MG disintegrating tablet Take 4 mg by mouth every 8 (eight) hours as needed for vomiting or nausea. 11/04/20   [provider]  oxyCODONE (ROXICODONE) 5 MG immediate release tablet Take 1 tablet (5 mg total) by mouth every 4 (four) hours as needed for severe pain. 02/06/21   Henderly, Britni A, PA-C  oxyCODONE-acetaminophen (PERCOCET/ROXICET) 5-325 MG tablet Take 0.25 tablets by mouth 2 (two) times daily as needed (leg pain). 02/20/21   [provider]  silver sulfADIAZINE (SILVADENE) 1 % cream Apply 1 application topically daily. 02/06/21   Henderly, Britni A, PA-C  simvastatin (ZOCOR) 20 MG tablet Take 20 mg by mouth at bedtime.    [provider]      Allergies    Diphenhydramine, Motrin [ibuprofen], Contrast media [iodinated contrast media], Shellfish allergy, Banana, Chlorhexidine, Ferrous sulfate, and Iron dextran    Review of Systems   Review of Systems  Constitutional:  Positive for fatigue. Negative for fever.  HENT:  Negative for rhinorrhea and sore throat.   Eyes:  Negative for redness.  Respiratory:  Negative for cough.   Cardiovascular:  Negative for chest pain.  Gastrointestinal:  Negative for abdominal pain, diarrhea, nausea and vomiting.  Genitourinary:  Negative for flank pain, hematuria (Does not make urine) and vaginal bleeding.  Musculoskeletal:  Negative for myalgias.  Skin:  Negative for rash.  Neurological:  Negative for  headaches.   Physical Exam Updated Vital Signs BP (!) 148/96    Pulse 92    Temp 97.8 F (36.6 C)    Resp 17    Ht 5\' 6"  (1.676 m)    Wt 74.4 kg    SpO2 98%    BMI 26.47 kg/m  Physical Exam Vitals and nursing note reviewed.  Constitutional:      General: She is not in acute distress.    Appearance: She is well-developed.  HENT:     Head: Normocephalic and atraumatic.     Right Ear: External ear normal.     Left Ear: External ear normal.     Nose: Nose normal.  Eyes:     Conjunctiva/sclera: Conjunctivae normal.  Cardiovascular:     Rate and Rhythm: Normal rate and regular rhythm.     Heart sounds: No murmur heard. Pulmonary:     Effort: No respiratory distress.     Breath sounds: No wheezing, rhonchi or rales.  Abdominal:  Palpations: Abdomen is soft.     Tenderness: There is no abdominal tenderness. There is no guarding or rebound.  Musculoskeletal:     Cervical back: Normal range of motion and neck supple.     Right lower leg: No edema.     Left lower leg: No edema.  Skin:    General: Skin is warm and dry.     Findings: No rash.  Neurological:     General: No focal deficit present.     Mental Status: She is alert. Mental status is at baseline.     Motor: No weakness.  Psychiatric:        Mood and Affect: Mood normal.    ED Results / Procedures / Treatments   Labs (all labs ordered are listed, but only abnormal results are displayed) Labs Reviewed  CBC WITH DIFFERENTIAL/PLATELET - Abnormal; Notable for the following components:      Result Value   RBC 3.18 (*)    Hemoglobin 8.4 (*)    HCT 29.4 (*)    MCHC 28.6 (*)    RDW 17.5 (*)    All other components within normal limits  BASIC METABOLIC PANEL - Abnormal; Notable for the following components:   Chloride 97 (*)    Glucose, Bld 214 (*)    BUN 29 (*)    Creatinine, Ser 8.01 (*)    GFR, Estimated 6 (*)    All other components within normal limits    EKG None  Radiology No results  found.  Procedures Procedures    Medications Ordered in ED Medications - No data to display  ED Course/ Medical Decision Making/ A&P    Patient seen and examined. Plan discussed with patient.   Labs: CBC, BMP  Imaging: None  Medications/Fluids: None  Vital signs reviewed and are as follows: BP (!) 148/96    Pulse 92    Temp 97.8 F (36.6 C)    Resp 17    Ht 5\' 6"  (1.676 m)    Wt 74.4 kg    SpO2 98%    BMI 26.47 kg/m   Initial impression: Anemia of chronic disease without active bleeding  1:51 PM On re-exam patient appears comfortable no distress.  Discussed case with Dr. Johnney Killian.  Discussed pertinent results with patient at bedside. This included hemoglobin of 8.4. They are comfortable with discharge at this time.   Home treatment: None, dialysis tomorrow with discussion needed on where to get blood transfusion this week  Follow-up: At dialysis tomorrow.  Encouraged return to ED with development of bleeding in the stool, vomit, etc.  Encouraged return with lightheadedness or syncope, shortness of breath.  Patient verbalized understanding and agreed with plan.                             Medical Decision Making  Patient is ESRD with low hemoglobin.  Slightly lower than apparent baseline.  She had labs drawn last week and hemoglobin was 7.4.  Fortunately, this has increased to 8.4 today.  No evidence of active bleeding.  She is hemodynamically stable and looks well.  At this point, no indications for admission to the hospital for emergent blood transfusion.  She is willing to return if something changes or worsens, she develops active bleeding, and willing to follow-up with dialysis for routine transfusion for symptomatic anemia.        Final Clinical Impression(s) / ED Diagnoses Final diagnoses:  Anemia due  to chronic kidney disease, on chronic dialysis [facility])    Rx / DC Orders ED Discharge Orders     None         Carlisle Cater, PA-C 03/15/21 1354     Charlesetta Shanks, MD 03/18/21 1035

## 2021-03-15 NOTE — ED Notes (Addendum)
Pt states understanding of dc instructions, importance of follow up.  Pt denies questions or concerns upon dc. Pt declined wheelchair assistance upon dc. Pt ambulated out of ed w/ steady gait. No belongings left in room upon dc.  

## 2021-03-17 ENCOUNTER — Encounter: Payer: Self-pay | Admitting: Internal Medicine

## 2021-03-17 ENCOUNTER — Other Ambulatory Visit: Payer: Self-pay | Admitting: Internal Medicine

## 2021-03-17 ENCOUNTER — Encounter (HOSPITAL_COMMUNITY): Payer: Self-pay | Admitting: *Deleted

## 2021-03-17 ENCOUNTER — Telehealth: Payer: Self-pay

## 2021-03-17 ENCOUNTER — Ambulatory Visit (INDEPENDENT_AMBULATORY_CARE_PROVIDER_SITE_OTHER): Payer: Medicare Other | Admitting: Internal Medicine

## 2021-03-17 VITALS — BP 108/60 | HR 71 | Ht 66.0 in | Wt 171.0 lb

## 2021-03-17 DIAGNOSIS — K2100 Gastro-esophageal reflux disease with esophagitis, without bleeding: Secondary | ICD-10-CM | POA: Diagnosis not present

## 2021-03-17 DIAGNOSIS — K3184 Gastroparesis: Secondary | ICD-10-CM

## 2021-03-17 DIAGNOSIS — K222 Esophageal obstruction: Secondary | ICD-10-CM

## 2021-03-17 MED ORDER — METOCLOPRAMIDE HCL 10 MG PO TABS: 10.0000 mg | ORAL_TABLET | Freq: Three times a day (TID) | ORAL | 1 refills | Status: DC

## 2021-03-17 MED ORDER — FAMOTIDINE 40 MG PO TABS: ORAL_TABLET | ORAL | 1 refills | Status: DC

## 2021-03-17 MED ORDER — OMEPRAZOLE 40 MG PO CPDR: 40.0000 mg | DELAYED_RELEASE_CAPSULE | Freq: Two times a day (BID) | ORAL | 3 refills | Status: DC

## 2021-03-17 NOTE — Telephone Encounter (Signed)
Left message for pt to call back  °

## 2021-03-17 NOTE — Telephone Encounter (Signed)
Pt notified that Dr. Carlean Purl wanted to see her in the office. Pt scheduled for an OV TODAY 03/17/2021 at 2:50. Pt made aware. Pt verbalized understanding with all questions answered.

## 2021-03-17 NOTE — Patient Instructions (Signed)
Stop your Doxycyline please.  We have sent the following medications to your pharmacy for you to pick up at your convenience: Omeprazole, metoclopramide, famotidine.  We will contact you about doing a procedure at the hospital.   I appreciate the opportunity to care for you. Silvano Rusk, MD, [facility]

## 2021-03-17 NOTE — Telephone Encounter (Signed)
Left message for pt to call back to schedule an appointment for an Esophageal Stricture:

## 2021-03-17 NOTE — Progress Notes (Signed)
[name] 36 y.o. 1985/09/14 [id number]  Assessment & Plan:   Encounter Diagnoses  Name Primary?   Esophageal stricture Yes   Gastroesophageal reflux disease with esophagitis without hemorrhage    Gastroparesis      Very difficult situation with improvement and then regression of this distal esophageal stricture.  I am suspicious that the recent addition of doxycycline has caused the deterioration.  I have discontinued that.  From everything I see and hear I do not think she needs that at this point and if she does need antibiotics she has to take something different.  I will communicate with the wound care clinic.   She is going to need another esophageal dilation at some point.  I have given her a new medication regimen that includes metoclopramide 4 times a day 3 times daily before meals and at bedtime.  Twice daily omeprazole at 40 mg will continue and we will add twice daily famotidine 40 mg morning and at bedtime.  I have emphasized the need for strict adherence with these medications though this is a difficult regimen this has been a struggle to heal her stricture.  I explained how the gastroparesis can aggravate things.  If we cannot make more headway she may need a tertiary referral to see if there are other options.  I do not think a triamcinolone injection makes sense given the nature of her stricture.  Repeat biopsies may make sense.  Her procedures have to be done at the hospital due to her end-stage renal disease.   Medications Discontinued During This Encounter  Medication Reason       doxycycline (VIBRA-TABS) 100 MG tablet            Meds ordered this encounter  Medications   metoCLOPramide (REGLAN) 10 MG tablet    Sig: Take 1 tablet (10 mg total) by mouth 4 (four) times daily -  before meals and at bedtime.    Dispense:  120 tablet    Refill:  1   DISCONTD: famotidine (PEPCID) 40 MG tablet    Sig: 2 times a day AM and bedtime    Dispense:  60 tablet     Refill:  1   omeprazole (PRILOSEC) 40 MG capsule    Sig: Take 1 capsule (40 mg total) by mouth 2 (two) times daily before a meal. Open capsule and place granules in water or applesauce to swallow    Dispense:  180 capsule    Refill:  3   CC: [name], MD   Subjective:   Chief Complaint: Esophageal stricture and dysphagia  HPI 36 year old African-American woman with diabetes, ESRD on dialysis, and gastroparesis and a persistent difficult distal esophageal stricture and esophagitis problem.  Recent history pertinent for discovery of a very tight esophageal stricture at January 06, 2021 EGD.  Biopsies showing inflammation.  She has had repeat EGD and dilations on November 7, November 22, November 29, December 8, and March 12, 2021.  We will progressively dilating her though there was a persistent inflammation in the distal esophagus, dilated up to 15 mm by Dr. Lorenso Courier on 02/19/2021.  Repeat EGD on December 29 by Dr. Bryan Lemma showed that the stricture had regressed and deteriorated and the scope passed with gentle pressure relieving a laceration in the wall of the esophagus so no balloon dilation was undertaken.  I called her to have her come in today to review all of her medications.  Dr. Bryan Lemma had intended for her to go  on 20 mg of famotidine twice daily.  I do not think she ever did that and she would talks about how famotidine was prescribed in the ER and then stopped.  Review of medications reveals that she was started on doxycycline at some point in the past several weeks.  She is undergoing wound care for a burn to her foot.  Review of the wound care notes from early December show that there was no debridement necessary and that it was thought to be healing well.  She thinks her primary care provider has put her on the doxycycline but is not sure.  She has not noted any drainage or infection signs from the wound she has it bandaged today.  She is due for a follow-up in the wound care  clinic in [place] on January 6.  She continues to have difficulty swallowing some pills and firmer meats.  She does not vomit.  She was prescribed Reglan and I thought she was taking that regularly but she has not been.  She says she thinks she probably needs a refill on the omeprazole.  She is opening those capsules and swallowing them twice a day.  She claims adherence to that. Allergies  Allergen Reactions   Diphenhydramine Anaphylaxis   Motrin [Ibuprofen] Shortness Of Breath and Itching   Contrast Media [Iodinated Contrast Media] Itching   Shellfish Allergy Hives   Banana Itching, Nausea And Vomiting and Other (See Comments)    Sick on the stomach   Chlorhexidine Itching   Ferrous Sulfate Itching   Iron Dextran Itching and Other (See Comments)    Vein irritation    Current Meds  Medication Sig   ACCU-CHEK GUIDE test strip USE TO TEST THREE TIMES DAILY FOR GLUCOSE TESTING   ACCU-CHEK SOFTCLIX LANCETS lancets Use to check blood sugar 4 times per day.   acetaminophen (TYLENOL) 500 MG tablet Take 500-1,000 mg by mouth every 6 (six) hours as needed for moderate pain.   BD PEN NEEDLE NANO 2ND GEN 32G X 4 MM MISC 4 (four) times daily. as directed   calcium acetate (PHOSLO) 667 MG capsule Take 1,334 mg by mouth 3 (three) times daily with meals. Take 1-2 capsules [phone] mg) by mouth with snacks & take 2 capsules (1334 mg) by mouth with each meal   Calcium Carbonate Antacid (CALCIUM CARBONATE, DOSED IN MG ELEMENTAL CALCIUM,) 1250 MG/5ML SUSP Take 500 mg of elemental calcium by mouth daily.   cetirizine (ZYRTEC) 10 MG tablet Take 10 mg by mouth daily.   fluticasone (FLONASE) 50 MCG/ACT nasal spray Place 2 sprays into both nostrils daily as needed for allergies.   gabapentin (NEURONTIN) 400 MG capsule Take 800-1,200 mg by mouth at bedtime.   hydrOXYzine (ATARAX/VISTARIL) 50 MG tablet Take 50 mg by mouth 2 (two) times daily as needed for anxiety.   insulin aspart (NOVOLOG FLEXPEN) 100 UNIT/ML  FlexPen Inject 0-15 Units into the skin in the morning, at noon, in the evening, and at bedtime. Sliding Scale insulin   insulin degludec (TRESIBA) 100 UNIT/ML FlexTouch Pen Inject 20 Units into the skin daily. (Patient taking differently: Inject 20 Units into the skin daily.)   Insulin Pen Needle (BD PEN NEEDLE NANO 2ND GEN) 32G X 4 MM MISC 1 each by Does not apply route in the morning, at noon, in the evening, and at bedtime.   Insulin Pen Needle 32G X 4 MM MISC Use as directed   lidocaine (XYLOCAINE) 2 % solution Use as directed 15 mLs  in the mouth or throat every 6 (six) hours as needed for mouth pain.   lidocaine-prilocaine (EMLA) cream Apply 1 application topically daily as needed [facility]).   loperamide (IMODIUM) 2 MG capsule Take 2 mg by mouth as needed for diarrhea or loose stools.   metoprolol tartrate (LOPRESSOR) 25 MG tablet Take 1 tablet (25 mg total) by mouth 2 (two) times daily. (Patient taking differently: Take 25 mg by mouth in the morning.)   ondansetron (ZOFRAN-ODT) 4 MG disintegrating tablet Take 4 mg by mouth every 8 (eight) hours as needed for vomiting or nausea.   oxyCODONE (ROXICODONE) 5 MG immediate release tablet Take 1 tablet (5 mg total) by mouth every 4 (four) hours as needed for severe pain.   oxyCODONE-acetaminophen (PERCOCET/ROXICET) 5-325 MG tablet Take 0.25 tablets by mouth 2 (two) times daily as needed (leg pain).   silver sulfADIAZINE (SILVADENE) 1 % cream Apply 1 application topically daily.   simvastatin (ZOCOR) 20 MG tablet Take 20 mg by mouth at bedtime.   [DISCONTINUED] doxycycline (VIBRA-TABS) 100 MG tablet Take 100 mg by mouth 2 (two) times daily.   [DISCONTINUED] famotidine (PEPCID) 40 MG tablet 2 times a day AM and bedtime   [DISCONTINUED] metoCLOPramide (REGLAN) 10 MG tablet Take 1 tablet (10 mg total) by mouth every 8 (eight) hours as needed for nausea (nausea/headache). (Patient taking differently: Take 10 mg by mouth daily.)   [DISCONTINUED] omeprazole  (PRILOSEC) 40 MG capsule Take 1 capsule (40 mg total) by mouth 2 (two) times daily before a meal. Open capsule and place granules in water or applesauce to swallow   Past Medical History:  Diagnosis Date   Anemia    Blood transfusion without reported diagnosis    Chronic kidney disease    kidney transplant 07   Diabetes mellitus    Pt reports diagnosis in June 2011, Type 2   Diabetes mellitus without complication ([place])    GERD (gastroesophageal reflux disease)    Hyperlipidemia    Hypertension    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   LEARNING DISABILITY 09/25/2007   Qualifier: Diagnosis of  By: Deborra Medina MD, Talia     Pseudoseizures ([place]) 12/22/2012   Pyelonephritis 06/23/2014   Renal disorder    Seasonal allergies    Seizures ([place])    UTI (urinary tract infection) 01/09/2015   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   Qualifier: Diagnosis of  By: Carlena Sax  MD, Colletta [place]     Past Surgical History:  Procedure Laterality Date   ARTERIOVENOUS GRAFT PLACEMENT Bilateral    "neither work" (10/24/2017)   AV FISTULA PLACEMENT Left 10/26/2018   Procedure: CREATION OF ARTERIOVENOUS FISTULA  LEFT ARM;  Surgeon: [name], MD;  Location: [place];  Service: Vascular;  Laterality: Left;   AV FISTULA PLACEMENT Left 05/23/2020   Procedure: LEFT ARM ARTERIOVENOUS (AV) FISTULA CREATION;  Surgeon: [name], MD;  Location: [place];  Service: Vascular;  Laterality: Left;   BALLOON DILATION N/A 01/19/2021   Procedure: Larrie Kass DILATION;  Surgeon: Gatha Mayer, MD;  Location: [facility];  Service: Endoscopy;  Laterality: N/A;   BALLOON DILATION N/A 02/10/2021   Procedure: BALLOON DILATION;  Surgeon: Irene Shipper, MD;  Location: [facility];  Service: Endoscopy;  Laterality: N/A;   BALLOON DILATION N/A 02/19/2021   Procedure: BALLOON DILATION;  Surgeon: Sharyn Creamer, MD;  Location: [facility];  Service: Gastroenterology;  Laterality: N/A;   BASCILIC VEIN TRANSPOSITION Left 12/21/2018    Procedure: Left arm BASILIC  VEIN TRANSPOSITION SECOND STAGE;  Surgeon: [name], MD;  Location: [place];  Service: Vascular;  Laterality: Left;   CHOLECYSTECTOMY N/A 06/30/2017   Procedure: LAPAROSCOPIC CHOLECYSTECTOMY WITH INTRAOPERATIVE CHOLANGIOGRAM;  Surgeon: Excell Seltzer, MD;  Location: [facility];  Service: General;  Laterality: N/A;   ESOPHAGOGASTRODUODENOSCOPY (EGD) WITH PROPOFOL N/A 07/04/2017   Procedure: ESOPHAGOGASTRODUODENOSCOPY (EGD) WITH PROPOFOL;  Surgeon: Clarene Essex, MD;  [facility];  Service: Endoscopy;  Laterality: N/A;   ESOPHAGOGASTRODUODENOSCOPY (EGD) WITH PROPOFOL N/A 08/10/2020   Procedure: ESOPHAGOGASTRODUODENOSCOPY (EGD) WITH PROPOFOL;  Surgeon: Doran Stabler, MD;  Location: [place];  Service: Gastroenterology;  Laterality: N/A;   ESOPHAGOGASTRODUODENOSCOPY (EGD) WITH PROPOFOL N/A 01/19/2021   Procedure: ESOPHAGOGASTRODUODENOSCOPY (EGD) WITH PROPOFOL;  Surgeon: Gatha Mayer, MD;  [facility];  Service: Endoscopy;  Laterality: N/A;  WITH FLUOROSCOPY AND DILATION   ESOPHAGOGASTRODUODENOSCOPY (EGD) WITH PROPOFOL N/A 02/03/2021   Procedure: ESOPHAGOGASTRODUODENOSCOPY (EGD) WITH PROPOFOL;  Surgeon: Gatha Mayer, MD;  [facility];  Service: Endoscopy;  Laterality: N/A;   ESOPHAGOGASTRODUODENOSCOPY (EGD) WITH PROPOFOL N/A 02/10/2021   Procedure: ESOPHAGOGASTRODUODENOSCOPY (EGD) WITH PROPOFOL;  Surgeon: Irene Shipper, MD;  [facility];  Service: Endoscopy;  Laterality: N/A;  Balloon Dilation   ESOPHAGOGASTRODUODENOSCOPY (EGD) WITH PROPOFOL N/A 02/19/2021   Procedure: ESOPHAGOGASTRODUODENOSCOPY (EGD) WITH PROPOFOL;  Surgeon: Sharyn Creamer, MD;  [facility];  Service: Gastroenterology;  Laterality: N/A;   ESOPHAGOGASTRODUODENOSCOPY (EGD) WITH PROPOFOL N/A 03/12/2021   Procedure: ESOPHAGOGASTRODUODENOSCOPY (EGD) WITH PROPOFOL;  Surgeon: Lavena Bullion, DO;  Location[facility];  Service:  Gastroenterology;  Laterality: N/A;   FOREIGN BODY REMOVAL N/A 02/03/2021   Procedure: FOREIGN BODY REMOVAL;  Surgeon: Gatha Mayer, MD;  Location[facility];  Service: Endoscopy;  Laterality: N/A;   INSERTION OF DIALYSIS CATHETER N/A 03/20/2018   Procedure: INSERTION OF TUNNELED DIALYSIS CATHETER - RIGHT INTERANL JUGULAR PLACEMENT;  Surgeon: Angelia Mould, MD;  Location: [place];  Service: Vascular;  Laterality: N/A;   IR FLUORO GUIDE CV LINE RIGHT  04/18/2020   IR GUIDED DRAIN W CATHETER PLACEMENT  10/28/2018   KIDNEY TRANSPLANT  2007   KIDNEY TRANSPLANT Right    PARATHYROIDECTOMY  ?2012   "3/4 removed" (10/24/2017)   RENAL BIOPSY Bilateral 2003   REVISON OF ARTERIOVENOUS FISTULA Left 09/24/2020   Procedure: LEFT UPPER ARM ARTERIOVENOUS GRAFT CREATION;  Surgeon: [name], MD;  Location: [place];  Service: Vascular;  Laterality: Left;   UPPER EXTREMITY VENOGRAPHY Bilateral 10/19/2018   Procedure: UPPER EXTREMITY VENOGRAPHY;  Surgeon: [name], MD;  Location: [place] [facility];  Service: Cardiovascular;  Laterality: Bilateral;  Bilateral    UPPER EXTREMITY VENOGRAPHY Left 09/04/2020   Procedure: UPPER EXTREMITY VENOGRAPHY - Left Upper;  Surgeon: [name], MD;  Location: [place] [facility];  Service: Cardiovascular;  Laterality: Left;   Social History   Social History Narrative   Single\   Dialysis Monday Wednesday Friday   Never smoker no current alcohol no drug use      Right Handed   Drinks caffeine rarely   family history includes Aneurysm in her mother; Arthritis in her mother; CAD in her father; Diabetes in her father; Early death in her brother; Hypertension in her mother.   Review of Systems See HPI  Objective:   Physical Exam BP 108/60    Pulse 71    Ht 5\' 6"  (1.676 m)    Wt 171 lb (77.6 kg)    BMI 27.60  kg/m    21 minutes total time

## 2021-03-18 ENCOUNTER — Encounter (HOSPITAL_COMMUNITY): Payer: Medicare Other

## 2021-03-20 ENCOUNTER — Other Ambulatory Visit: Payer: Self-pay

## 2021-03-20 ENCOUNTER — Encounter: Payer: Medicare Other | Attending: Physician Assistant | Admitting: Physician Assistant

## 2021-03-20 DIAGNOSIS — Z940 Kidney transplant status: Secondary | ICD-10-CM | POA: Diagnosis not present

## 2021-03-20 DIAGNOSIS — E1022 Type 1 diabetes mellitus with diabetic chronic kidney disease: Secondary | ICD-10-CM | POA: Diagnosis not present

## 2021-03-20 DIAGNOSIS — I120 Hypertensive chronic kidney disease with stage 5 chronic kidney disease or end stage renal disease: Secondary | ICD-10-CM | POA: Diagnosis not present

## 2021-03-20 DIAGNOSIS — Z992 Dependence on renal dialysis: Secondary | ICD-10-CM | POA: Insufficient documentation

## 2021-03-20 DIAGNOSIS — Z794 Long term (current) use of insulin: Secondary | ICD-10-CM | POA: Diagnosis not present

## 2021-03-20 DIAGNOSIS — N186 End stage renal disease: Secondary | ICD-10-CM | POA: Diagnosis not present

## 2021-03-20 DIAGNOSIS — L97529 Non-pressure chronic ulcer of other part of left foot with unspecified severity: Secondary | ICD-10-CM | POA: Diagnosis not present

## 2021-03-20 DIAGNOSIS — E10621 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: Secondary | ICD-10-CM | POA: Diagnosis not present
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
## 2021-03-20 NOTE — Progress Notes (Signed)
This patient is a Type I diabetic and I have corrected the ICD 10 codes for this in the charts starting with this admission December 2022.
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
## 2021-03-26 ENCOUNTER — Encounter: Payer: Medicare Other | Admitting: Physician Assistant

## 2021-03-26 ENCOUNTER — Telehealth: Payer: Self-pay | Admitting: Internal Medicine

## 2021-03-26 ENCOUNTER — Other Ambulatory Visit: Payer: Self-pay

## 2021-03-26 DIAGNOSIS — E10621 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: Secondary | ICD-10-CM | POA: Diagnosis not present

## 2021-03-26 NOTE — Telephone Encounter (Signed)
Good morning Dr. Henrene Pastor,  Dr. Wess Botts from [facility] would like you to call her about this patient.  Her personal cell phone number is [phone].  Thank you.
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
## 2021-03-26 NOTE — Telephone Encounter (Signed)
Dr. Carlean Purl patient.  I will forward to him
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
## 2021-03-28 NOTE — Telephone Encounter (Signed)
Spoke to this MD on 1/12  Patient upset that she cannot be listed for renal transplant w. Current GI problems - GERD, stricture and especially gastroparesis  This Md asking about possibility of gastric pacemaker  Maybe  Will discuss w/ patient - consider tertiary evaluation

## 2021-03-31 NOTE — Telephone Encounter (Signed)
Chart reviewed and called the patient.  It turns out she has never had a gastric emptying study completed.  She certainly has a syndrome compatible with gastroparesis but we should have documentation of that.  She does report that dysphagia is improved though she still has intermittent solid food dysphagia.  She is also having some diarrhea issues.  I did my best to explain that she has a complicated medical history and set of problems and that the diabetes and neuropathy may be causing much of her problems.   Remo Lipps, please schedule her for a 4-hour solid-phase gastric emptying study.    This will need to be done on a Tuesday or Thursday since she dialyzes Monday Wednesday Friday she has a bunch of appointments  coming up so it may be after February 7 that we can do this.  I think it would be best that find out availability from her.  When she does the gastric emptying study please also do the following:  #1 stop Reglan (metoclopramide) 5 full days prior to the procedure  #2 since she will be fasting she will need to hold her short acting insulin and take half her dose of Tresiba the night before

## 2021-04-01 NOTE — Telephone Encounter (Signed)
Left message for pt to call back  °

## 2021-04-02 NOTE — Telephone Encounter (Signed)
Left message for pt to call back  °

## 2021-04-02 NOTE — Telephone Encounter (Signed)
Patient returned your call, requesting a  call back. Please advise.

## 2021-04-03 ENCOUNTER — Other Ambulatory Visit: Payer: Self-pay

## 2021-04-03 ENCOUNTER — Encounter (HOSPITAL_COMMUNITY): Payer: Self-pay | Admitting: *Deleted

## 2021-04-03 ENCOUNTER — Encounter: Payer: Medicare Other | Admitting: Internal Medicine

## 2021-04-03 DIAGNOSIS — E10621 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: Secondary | ICD-10-CM | POA: Diagnosis not present

## 2021-04-03 DIAGNOSIS — R1319 Other dysphagia: Secondary | ICD-10-CM

## 2021-04-03 DIAGNOSIS — R1310 Dysphagia, unspecified: Secondary | ICD-10-CM

## 2021-04-03 NOTE — Telephone Encounter (Signed)
Pt notified of Dr. Carlean Purl recommendations:  Pt scheduled for Gastric Emptying study on 04/23/2021 at 8:30 at [facility]. Pt to arrive at 8:00 at [facility] admitting Pt made aware Nothing to eat or drink past midnight: Pt made aware:   Pt notified #1 stop Reglan (metoclopramide) 5 full days prior to the procedure  #2 since she will be fasting she will need to hold her short acting insulin and take half her dose of Tresiba the night before  Pt verbalized understanding with all questions answered.  Pt sent a my chart message with the details of what we discussed in regard to the date, time, location, and Instructions prior to study. Pt made aware
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
## 2021-04-09 ENCOUNTER — Other Ambulatory Visit: Payer: Self-pay

## 2021-04-09 ENCOUNTER — Encounter: Payer: Medicare Other | Admitting: Physician Assistant

## 2021-04-09 DIAGNOSIS — E10621 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: Secondary | ICD-10-CM | POA: Diagnosis not present
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
## 2021-04-23 ENCOUNTER — Other Ambulatory Visit: Payer: Self-pay

## 2021-04-23 ENCOUNTER — Ambulatory Visit (HOSPITAL_COMMUNITY)
Admission: RE | Admit: 2021-04-23 | Discharge: 2021-04-23 | Disposition: A | Discharge status: Home / Self Care | Payer: Medicare Other | Source: Ambulatory Visit | Attending: Internal Medicine | Admitting: Internal Medicine

## 2021-04-23 DIAGNOSIS — R1319 Other dysphagia: Secondary | ICD-10-CM | POA: Diagnosis present

## 2021-04-23 DIAGNOSIS — R1310 Dysphagia, unspecified: Secondary | ICD-10-CM | POA: Diagnosis present

## 2021-04-23 MED ORDER — TECHNETIUM TC 99M SULFUR COLLOID
1.8000 | Freq: Once | INTRAVENOUS | Status: AC | PRN
  Administered 2021-04-23: 1.8 via INTRAVENOUS

## 2021-04-24 ENCOUNTER — Encounter: Payer: Medicare Other | Attending: Physician Assistant | Admitting: Physician Assistant

## 2021-04-24 DIAGNOSIS — E10621 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: Secondary | ICD-10-CM | POA: Diagnosis present

## 2021-04-24 DIAGNOSIS — I10 Essential (primary) hypertension: Secondary | ICD-10-CM | POA: Diagnosis not present

## 2021-04-24 DIAGNOSIS — Z940 Kidney transplant status: Secondary | ICD-10-CM | POA: Diagnosis not present

## 2021-04-24 DIAGNOSIS — Z992 Dependence on renal dialysis: Secondary | ICD-10-CM | POA: Diagnosis not present

## 2021-04-24 DIAGNOSIS — T25221A Burn of second degree of right foot, initial encounter: Secondary | ICD-10-CM | POA: Insufficient documentation

## 2021-04-24 DIAGNOSIS — T25322A Burn of third degree of left foot, initial encounter: Secondary | ICD-10-CM | POA: Diagnosis not present

## 2021-04-24 DIAGNOSIS — Y93G3 Activity, cooking and baking: Secondary | ICD-10-CM | POA: Diagnosis not present

## 2021-04-24 DIAGNOSIS — E1065 Type 1 diabetes mellitus with hyperglycemia: Secondary | ICD-10-CM | POA: Insufficient documentation

## 2021-04-24 DIAGNOSIS — X102XXA Contact with fats and cooking oils, initial encounter: Secondary | ICD-10-CM | POA: Insufficient documentation
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
## 2021-04-30 ENCOUNTER — Ambulatory Visit: Payer: Medicare Other

## 2021-05-07 ENCOUNTER — Other Ambulatory Visit: Payer: Self-pay

## 2021-05-07 ENCOUNTER — Encounter: Payer: Medicare Other | Admitting: Physician Assistant

## 2021-05-07 DIAGNOSIS — E10621 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: Secondary | ICD-10-CM | POA: Diagnosis not present
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
## 2021-05-19 ENCOUNTER — Other Ambulatory Visit: Payer: Self-pay

## 2021-05-19 ENCOUNTER — Encounter: Payer: Medicare Other | Attending: Physician Assistant | Admitting: Physician Assistant

## 2021-05-19 DIAGNOSIS — T25322A Burn of third degree of left foot, initial encounter: Secondary | ICD-10-CM | POA: Diagnosis not present

## 2021-05-19 DIAGNOSIS — N186 End stage renal disease: Secondary | ICD-10-CM | POA: Diagnosis not present

## 2021-05-19 DIAGNOSIS — Z794 Long term (current) use of insulin: Secondary | ICD-10-CM | POA: Diagnosis not present

## 2021-05-19 DIAGNOSIS — X58XXXA Exposure to other specified factors, initial encounter: Secondary | ICD-10-CM | POA: Insufficient documentation

## 2021-05-19 DIAGNOSIS — I120 Hypertensive chronic kidney disease with stage 5 chronic kidney disease or end stage renal disease: Secondary | ICD-10-CM | POA: Diagnosis not present

## 2021-05-19 DIAGNOSIS — T25221A Burn of second degree of right foot, initial encounter: Secondary | ICD-10-CM | POA: Diagnosis not present

## 2021-05-19 DIAGNOSIS — Z940 Kidney transplant status: Secondary | ICD-10-CM | POA: Diagnosis not present

## 2021-05-19 DIAGNOSIS — Z8619 Personal history of other infectious and parasitic diseases: Secondary | ICD-10-CM | POA: Insufficient documentation

## 2021-05-19 DIAGNOSIS — E10621 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: Secondary | ICD-10-CM | POA: Diagnosis present

## 2021-05-19 DIAGNOSIS — E1022 Type 1 diabetes mellitus with diabetic chronic kidney disease: Secondary | ICD-10-CM | POA: Insufficient documentation

## 2021-05-19 DIAGNOSIS — Z992 Dependence on renal dialysis: Secondary | ICD-10-CM | POA: Diagnosis not present

## 2021-05-19 NOTE — Progress Notes (Addendum)
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 Diagnosis Description 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

## 2021-05-19 NOTE — Progress Notes (Signed)
ANVI, MANGAL ([id number]) Visit Report for 05/19/2021 Arrival Information Details Patient Name: [name], HELDT Date of Service: 05/19/2021 12:30 PM Medical Record Number: [id number] Patient Account Number: [id number] Date of Birth/Sex: August 27, 1985 (35 y.o. F) Treating RN: Carlene Coria Primary Care Shikha Bibb: [name] Other Clinician: Referring Emalynn Clewis: [name] Treating Ladamien Rammel/Extender: Skipper Cliche in Treatment: 12 Visit Information History Since Last Visit All ordered tests and consults were completed: No Patient Arrived: Other Added or deleted any medications: No Arrival Time: 12:44 Any new allergies or adverse reactions: No Accompanied By: self Had a fall or experienced change in No Transfer Assistance: None activities of daily living that may affect Patient Identification Verified: Yes risk of falls: Secondary Verification Process Completed: Yes Signs or symptoms of abuse/neglect since last visito No Patient Requires Transmission-Based Precautions: No Hospitalized since last visit: No Patient Has Alerts: Yes Implantable device outside of the clinic excluding No Patient Alerts: DIABETIC cellular tissue based products placed in the center since last visit: Has Dressing in Place as Prescribed: Yes Pain Present Now: No Electronic Signature(s) Signed: 05/19/2021 4:20:37 PM By: Carlene Coria RN Entered By: Carlene Coria on 05/19/2021 12:48:26 [name] ([id number]) -------------------------------------------------------------------------------- Clinic Level of Care Assessment Details Patient Name: [name] Date of Service: 05/19/2021 12:30 PM Medical Record Number: [id number] Patient Account Number: [id number] Date of Birth/Sex: 09-02-1985 (35 y.o. F) Treating RN: Carlene Coria Primary Care Elinore Shults: [name] Other Clinician: Referring Deshayla Empson: [name] Treating Niyanna Asch/Extender: Skipper Cliche in Treatment: 12 Clinic Level of Care  Assessment Items TOOL 4 Quantity Score _0  - Use when only an EandM is performed on FOLLOW-UP visit 0 ASSESSMENTS - Nursing Assessment / Reassessment _1  - Reassessment of Co-morbidities (includes updates in patient status) 0 _2  - 0 Reassessment of Adherence to Treatment Plan ASSESSMENTS - Wound and Skin Assessment / Reassessment _3  - Simple Wound Assessment / Reassessment - one wound 0 _4  - 0 Complex Wound Assessment / Reassessment - multiple wounds _5  - 0 Dermatologic / Skin Assessment (not related to wound area) ASSESSMENTS - Focused Assessment _6  - Circumferential Edema Measurements - multi extremities 0 _7  - 0 Nutritional Assessment / Counseling / Intervention _8  - 0 Lower Extremity Assessment (monofilament, tuning fork, pulses) _9  - 0 Peripheral Arterial Disease Assessment (using hand held doppler) ASSESSMENTS - Ostomy and/or Continence Assessment and Care _10  - Incontinence Assessment and Management 0 _11  - 0 Ostomy Care Assessment and Management (repouching, etc.) PROCESS - Coordination of Care _12  - Simple Patient / Family Education for ongoing care 0 _13  - 0 Complex (extensive) Patient / Family Education for ongoing care _14  - 0 Staff obtains Programmer, systems, Records, Test Results / Process Orders _15  - 0 Staff telephones HHA, Nursing Homes / Clarify orders / etc _16  - 0 Routine Transfer to another Facility (non-emergent condition) _17  - 0 Routine Hospital Admission (non-emergent condition) _18  - 0 New Admissions / Biomedical engineer / Ordering NPWT, Apligraf, etc. _19  - 0 Emergency Hospital Admission (emergent condition) _20  - 0 Simple Discharge Coordination _21  - 0 Complex (extensive) Discharge Coordination PROCESS - Special Needs _22  - Pediatric / Minor Patient Management 0 _23  - 0 Isolation Patient Management _24  - 0 Hearing / Language / Visual special needs _25  - 0 Assessment of Community assistance (transportation, D/C planning, etc.) _26  - 0 Additional assistance /  Altered mentation _27  - 0 Support Surface(s) Assessment (bed, cushion, seat, etc.) INTERVENTIONS - Wound Cleansing / Measurement SHAUNTAY, BRUNELLI S. ([id number]) _28  - 0 Simple Wound Cleansing - one  wound _0  - 0 Complex Wound Cleansing - multiple wounds _1  - 0 Wound Imaging (photographs - any number of wounds) _2  - 0 Wound Tracing (instead of photographs) _3  - 0 Simple Wound Measurement - one wound _4  - 0 Complex Wound Measurement - multiple wounds INTERVENTIONS - Wound Dressings _5  - Small Wound Dressing one or multiple wounds 0 _6  - 0 Medium Wound Dressing one or multiple wounds _7  - 0 Large Wound Dressing one or multiple wounds <FTDDUKGURKYHCWCB>_7<\/SEGBTDVVOHYWVPXT>_0  - 0 Application of Medications - topical <GYIRSWNIOEVOJJKK>_9<\/FGHWEXHBZJIRCVEL>_3  - 0 Application of Medications - injection INTERVENTIONS - Miscellaneous _10  - External ear exam 0 _11  - 0 Specimen Collection (cultures, biopsies, blood, body fluids, etc.) _12  - 0 Specimen(s) / Culture(s) sent or taken to Lab for analysis _13  - 0 Patient Transfer (multiple staff / Civil Service fast streamer / Similar devices) _14  - 0 Simple Staple / Suture removal (25 or less) _15  - 0 Complex Staple / Suture removal (26 or more) _16  - 0 Hypo / Hyperglycemic Management (close monitor of Blood Glucose) _17  - 0 Ankle / Brachial Index (ABI) - do not check if billed separately _18  - 0 Vital Signs Has the patient been seen at the hospital within the last three years: Yes Total Score: 0 Level Of Care: ____ Electronic Signature(s) Signed: 05/19/2021 4:20:37 PM By: Carlene Coria RN Entered By: Carlene Coria on 05/19/2021 13:07:12 [name] ([id number]) -------------------------------------------------------------------------------- Lower Extremity Assessment Details Patient Name: [name] Date of Service: 05/19/2021 12:30 PM Medical Record Number: [id number] Patient Account Number: [id number] Date of Birth/Sex: 06-01-1985 (35 y.o. F) Treating RN: Carlene Coria Primary Care Ambria Mayfield: [name] Other  Clinician: Referring Lillieann Pavlich: [name] Treating Margy Sumler/Extender: Skipper Cliche in Treatment: 12 Edema Assessment Assessed: [Left: No] [Right: No] Edema: [Left: Ye] [Right: s] Vascular Assessment Pulses: Dorsalis Pedis Palpable: [Right:Yes] Electronic Signature(s) Signed: 05/19/2021 4:20:37 PM By: Carlene Coria RN Entered By: Carlene Coria on 05/19/2021 12:54:35 [name] ([id number]) -------------------------------------------------------------------------------- Multi Wound Chart Details Patient Name: [name] Date of Service: 05/19/2021 12:30 PM Medical Record Number: [id number] Patient Account Number: [id number] Date of Birth/Sex: [birth year] (35 y.o. F) Treating RN: Carlene Coria Primary Care Shaunie Boehm: [name] Other Clinician: Referring Devyon Keator: [name] Treating Yvan Dority/Extender: Skipper Cliche in Treatment: 12 Vital Signs Height(in): Pulse(bpm): 90 Weight(lbs): Blood Pressure(mmHg): 146/103 Body Mass Index(BMI): Temperature(F): 98.4 Respiratory Rate(breaths/min): 18 Photos: [N/A:N/A] Wound Location: Left, Plantar, Posterior Foot N/A N/A Wounding Event: Thermal Burn N/A N/A Primary Etiology: 3rd degree Burn N/A N/A Secondary Etiology: Diabetic Wound/Ulcer of the Lower N/A N/A Extremity Comorbid History: Anemia, Hypertension, Type II N/A N/A Diabetes, End Stage Renal Disease, History of Burn, Osteoarthritis, Neuropathy, Seizure Disorder Date Acquired: 02/05/2021 N/A N/A Weeks of Treatment: 12 N/A N/A Wound Status: Open N/A N/A Wound Recurrence: No N/A N/A Clustered Wound: Yes N/A N/A Clustered Quantity: 2 N/A N/A Pending Amputation on Yes N/A N/A Presentation: Measurements L x W x D (cm) 5x5.1x0.5 N/A N/A Area (cm) : 20.028 N/A N/A Volume (cm) : 10.014 N/A N/A % Reduction in Area: 83.30% N/A N/A % Reduction in Volume: 16.70% N/A N/A Classification: Full Thickness With Exposed N/A N/A Support Structures Exudate Amount:  Medium N/A N/A Exudate Type: Serosanguineous N/A N/A Exudate Color: red, brown N/A N/A Wound Margin: Thickened N/A N/A Granulation Amount: Large (67-100%) N/A N/A Granulation Quality: Pink, Hyper-granulation N/A N/A Necrotic Amount: Small (1-33%) N/A N/A Exposed Structures: Fat Layer (Subcutaneous Tissue): N/A N/A Yes Tendon: No Epithelialization: None N/A N/A Treatment Notes Electronic Signature(s)  Signed: 05/19/2021 4:20:37 PM By: Carlene Coria RN Lacinda Axon, [place] Chauncey Cruel ([id number]) Entered By: Carlene Coria on 05/19/2021 12:55:06 [name] ([id number]) -------------------------------------------------------------------------------- Multi-Disciplinary Care Plan Details Patient Name: [name] Date of Service: 05/19/2021 12:30 PM Medical Record Number: [id number] Patient Account Number: [id number] Date of Birth/Sex: 04-15-1985 (35 y.o. F) Treating RN: Carlene Coria Primary Care Alexxander Kurt: [name] Other Clinician: Referring Kourtland Coopman: [name] Treating Khi Mcmillen/Extender: Skipper Cliche in Treatment: 12 Active Inactive Wound/Skin Impairment Nursing Diagnoses: Impaired tissue integrity Goals: Patient will have a decrease in wound volume by X% from date: (specify in notes) Date Initiated: 02/24/2021 Date Inactivated: 05/07/2021 Target Resolution Date: 03/10/2021 Goal Status: Met Patient/caregiver will verbalize understanding of skin care regimen Date Initiated: 02/24/2021 Date Inactivated: 04/03/2021 Target Resolution Date: 02/24/2021 Goal Status: Met Ulcer/skin breakdown will have a volume reduction of 30% by week 4 Date Initiated: 02/24/2021 Date Inactivated: 05/07/2021 Target Resolution Date: 03/24/2021 Goal Status: Met Ulcer/skin breakdown will have a volume reduction of 50% by week 8 Date Initiated: 02/24/2021 Date Inactivated: 05/07/2021 Target Resolution Date: 04/21/2021 Goal Status: Met Ulcer/skin breakdown will have a volume reduction of 80% by week  12 Date Initiated: 02/24/2021 Target Resolution Date: 05/19/2021 Goal Status: Active Ulcer/skin breakdown will heal within 14 weeks Date Initiated: 02/24/2021 Target Resolution Date: 06/02/2021 Goal Status: Active Interventions: Assess patient/caregiver ability to obtain necessary supplies Assess patient/caregiver ability to perform ulcer/skin care regimen upon admission and as needed Assess ulceration(s) every visit Treatment Activities: Skin care regimen initiated : 02/24/2021 Topical wound management initiated : 02/24/2021 Notes: Electronic Signature(s) Signed: 05/19/2021 4:20:37 PM By: Carlene Coria RN Entered By: Carlene Coria on 05/19/2021 12:54:55 [name] ([id number]) -------------------------------------------------------------------------------- Pain Assessment Details Patient Name: [name] Date of Service: 05/19/2021 12:30 PM Medical Record Number: [id number] Patient Account Number: [id number] Date of Birth/Sex: Feb 09, 1986 (35 y.o. F) Treating RN: Carlene Coria Primary Care Loanne Emery: [name] Other Clinician: Referring Delainee Tramel: [name] Treating Brayen Bunn/Extender: Skipper Cliche in Treatment: 12 Active Problems Location of Pain Severity and Description of Pain Patient Has Paino No Site Locations Pain Management and Medication Current Pain Management: Electronic Signature(s) Signed: 05/19/2021 4:20:37 PM By: Carlene Coria RN Entered By: Carlene Coria on 05/19/2021 12:48:50 [name] ([id number]) -------------------------------------------------------------------------------- Patient/Caregiver Education Details Patient Name: [name] Date of Service: 05/19/2021 12:30 PM Medical Record Number: [id number] Patient Account Number: [id number] Date of Birth/Gender: Nov 02, 1985 (35 y.o. F) Treating RN: Carlene Coria Primary Care Physician: [name] Other Clinician: Referring Physician: [name] Treating Physician/Extender: Skipper Cliche in Treatment: 12 Education Assessment Education Provided To: Patient Education Topics Provided Wound/Skin Impairment: Methods: Explain/Verbal Responses: State content correctly Electronic Signature(s) Signed: 05/19/2021 4:20:37 PM By: Carlene Coria RN Entered By: Carlene Coria on 05/19/2021 13:11:19 [name] ([id number]) -------------------------------------------------------------------------------- Wound Assessment Details Patient Name: [name] Date of Service: 05/19/2021 12:30 PM Medical Record Number: [id number] Patient Account Number: [id number] Date of Birth/Sex: 16-Jul-1985 (35 y.o. F) Treating RN: Carlene Coria Primary Care Ethleen Lormand: [name] Other Clinician: Referring Nairi Oswald: [name] Treating Othell Diluzio/Extender: Skipper Cliche in Treatment: 12 Wound Status Wound Number: 15 Primary 3rd degree Burn Etiology: Wound Location: Left, Plantar, Posterior Foot Secondary Diabetic Wound/Ulcer of the Lower Extremity Wounding Event: Thermal Burn Etiology: Date Acquired: 02/05/2021 Wound Open Weeks Of Treatment: 12 Status: Clustered Wound: Yes Comorbid Anemia, Hypertension, Type II Diabetes, End Stage Pending Amputation On Presentation History: Renal Disease, History of Burn, Osteoarthritis, Neuropathy, Seizure Disorder Photos Wound Measurements Length: (cm) 5 Width: (cm)  5.1 Depth: (cm) 0.5 Clustered Quantity: 2 Area: (cm) 20.02 Volume: (cm) 10.01 % Reduction in Area: 83.3% % Reduction in Volume: 16.7% Epithelialization: None Tunneling: No 8 Undermining: No 4 Wound Description Classification: Full Thickness With Exposed Support Struct Wound Margin: Thickened Exudate Amount: Medium Exudate Type: Serosanguineous Exudate Color: red, brown ures Foul Odor After Cleansing: No Slough/Fibrino Yes Wound Bed Granulation Amount: Large (67-100%) Exposed Structure Granulation Quality: Pink, Hyper-granulation Fat Layer (Subcutaneous  Tissue) Exposed: Yes Necrotic Amount: Small (1-33%) Tendon Exposed: No Necrotic Quality: Adherent Therapist, music) Signed: 05/19/2021 4:20:37 PM By: Carlene Coria RN Entered By: Carlene Coria on 05/19/2021 12:54:12 [name] ([id number]) -------------------------------------------------------------------------------- Vitals Details Patient Name: [name] Date of Service: 05/19/2021 12:30 PM Medical Record Number: [id number] Patient Account Number: [id number] Date of Birth/Sex: November 10, 1985 (35 y.o. F) Treating RN: Carlene Coria Primary Care Idonna Heeren: [name] Other Clinician: Referring Elmarie Devlin: [name] Treating Shuntel Fishburn/Extender: Skipper Cliche in Treatment: 12 Vital Signs Time Taken: 12:48 Temperature (F): 98.4 Pulse (bpm): 90 Respiratory Rate (breaths/min): 18 Blood Pressure (mmHg): 146/103 Reference Range: 80 - 120 mg / dl Electronic Signature(s) Signed: 05/19/2021 4:20:37 PM By: Carlene Coria RN Entered By: Carlene Coria on 05/19/2021 12:48:41

## 2021-05-26 ENCOUNTER — Encounter: Payer: Medicare Other | Admitting: Internal Medicine

## 2021-05-29 ENCOUNTER — Encounter (HOSPITAL_COMMUNITY): Payer: Self-pay | Admitting: Emergency Medicine

## 2021-05-29 ENCOUNTER — Emergency Department (HOSPITAL_COMMUNITY): Payer: Medicare Other

## 2021-05-29 ENCOUNTER — Other Ambulatory Visit: Payer: Self-pay

## 2021-05-29 ENCOUNTER — Inpatient Hospital Stay (HOSPITAL_COMMUNITY)
Admission: EM | Admit: 2021-05-29 | Discharge: 2021-05-31 | DRG: 304 | Disposition: A | Discharge status: Home / Self Care | Payer: Medicare Other | Attending: Internal Medicine | Admitting: Internal Medicine

## 2021-05-29 DIAGNOSIS — N186 End stage renal disease: Secondary | ICD-10-CM | POA: Diagnosis present

## 2021-05-29 DIAGNOSIS — R9431 Abnormal electrocardiogram [ECG] [EKG]: Secondary | ICD-10-CM | POA: Diagnosis present

## 2021-05-29 DIAGNOSIS — Z91013 Allergy to seafood: Secondary | ICD-10-CM

## 2021-05-29 DIAGNOSIS — E874 Mixed disorder of acid-base balance: Secondary | ICD-10-CM | POA: Diagnosis present

## 2021-05-29 DIAGNOSIS — Z8249 Family history of ischemic heart disease and other diseases of the circulatory system: Secondary | ICD-10-CM

## 2021-05-29 DIAGNOSIS — Z888 Allergy status to other drugs, medicaments and biological substances status: Secondary | ICD-10-CM

## 2021-05-29 DIAGNOSIS — I160 Hypertensive urgency: Secondary | ICD-10-CM | POA: Diagnosis not present

## 2021-05-29 DIAGNOSIS — D631 Anemia in chronic kidney disease: Secondary | ICD-10-CM | POA: Diagnosis present

## 2021-05-29 DIAGNOSIS — Z905 Acquired absence of kidney: Secondary | ICD-10-CM

## 2021-05-29 DIAGNOSIS — Z886 Allergy status to analgesic agent status: Secondary | ICD-10-CM

## 2021-05-29 DIAGNOSIS — E785 Hyperlipidemia, unspecified: Secondary | ICD-10-CM | POA: Diagnosis present

## 2021-05-29 DIAGNOSIS — Y830 Surgical operation with transplant of whole organ as the cause of abnormal reaction of the patient, or of later complication, without mention of misadventure at the time of the procedure: Secondary | ICD-10-CM | POA: Diagnosis present

## 2021-05-29 DIAGNOSIS — E1140 Type 2 diabetes mellitus with diabetic neuropathy, unspecified: Secondary | ICD-10-CM | POA: Diagnosis present

## 2021-05-29 DIAGNOSIS — R112 Nausea with vomiting, unspecified: Secondary | ICD-10-CM | POA: Diagnosis present

## 2021-05-29 DIAGNOSIS — R109 Unspecified abdominal pain: Secondary | ICD-10-CM | POA: Diagnosis present

## 2021-05-29 DIAGNOSIS — Z8261 Family history of arthritis: Secondary | ICD-10-CM

## 2021-05-29 DIAGNOSIS — Z794 Long term (current) use of insulin: Secondary | ICD-10-CM

## 2021-05-29 DIAGNOSIS — K219 Gastro-esophageal reflux disease without esophagitis: Secondary | ICD-10-CM | POA: Diagnosis present

## 2021-05-29 DIAGNOSIS — T8612 Kidney transplant failure: Secondary | ICD-10-CM | POA: Diagnosis present

## 2021-05-29 DIAGNOSIS — I120 Hypertensive chronic kidney disease with stage 5 chronic kidney disease or end stage renal disease: Secondary | ICD-10-CM | POA: Diagnosis present

## 2021-05-29 DIAGNOSIS — Z91041 Radiographic dye allergy status: Secondary | ICD-10-CM

## 2021-05-29 DIAGNOSIS — E1065 Type 1 diabetes mellitus with hyperglycemia: Secondary | ICD-10-CM | POA: Diagnosis present

## 2021-05-29 DIAGNOSIS — E1122 Type 2 diabetes mellitus with diabetic chronic kidney disease: Secondary | ICD-10-CM | POA: Diagnosis present

## 2021-05-29 DIAGNOSIS — Z20822 Contact with and (suspected) exposure to covid-19: Secondary | ICD-10-CM | POA: Diagnosis present

## 2021-05-29 DIAGNOSIS — N2581 Secondary hyperparathyroidism of renal origin: Secondary | ICD-10-CM | POA: Diagnosis present

## 2021-05-29 DIAGNOSIS — Z992 Dependence on renal dialysis: Secondary | ICD-10-CM

## 2021-05-29 DIAGNOSIS — E875 Hyperkalemia: Secondary | ICD-10-CM | POA: Diagnosis present

## 2021-05-29 DIAGNOSIS — E1165 Type 2 diabetes mellitus with hyperglycemia: Secondary | ICD-10-CM | POA: Diagnosis present

## 2021-05-29 DIAGNOSIS — Z833 Family history of diabetes mellitus: Secondary | ICD-10-CM

## 2021-05-29 DIAGNOSIS — Z79899 Other long term (current) drug therapy: Secondary | ICD-10-CM

## 2021-05-29 LAB — COMPREHENSIVE METABOLIC PANEL
ALT: 14 U/L (ref 0–44)
AST: 21 U/L (ref 15–41)
Albumin: 4.5 g/dL (ref 3.5–5.0)
Alkaline Phosphatase: 90 U/L (ref 38–126)
Anion gap: 16 — ABNORMAL HIGH (ref 5–15)
BUN: 19 mg/dL (ref 6–20)
CO2: 26 mmol/L (ref 22–32)
Calcium: 10.5 mg/dL — ABNORMAL HIGH (ref 8.9–10.3)
Chloride: 95 mmol/L — ABNORMAL LOW (ref 98–111)
Creatinine, Ser: 5.52 mg/dL — ABNORMAL HIGH (ref 0.44–1.00)
GFR, Estimated: 10 mL/min — ABNORMAL LOW (ref >60)
Glucose, Bld: 284 mg/dL — ABNORMAL HIGH (ref 70–99)
Potassium: 4.8 mmol/L (ref 3.5–5.1)
Sodium: 137 mmol/L (ref 135–145)
Total Bilirubin: 0.7 mg/dL (ref 0.3–1.2)
Total Protein: 10.2 g/dL — ABNORMAL HIGH (ref 6.5–8.1)

## 2021-05-29 LAB — I-STAT VENOUS BLOOD GAS, ED
Acid-Base Excess: 7 mmol/L — ABNORMAL HIGH (ref 0.0–2.0)
Bicarbonate: 31.2 mmol/L — ABNORMAL HIGH (ref 20.0–28.0)
Calcium, Ion: 1.12 mmol/L — ABNORMAL LOW (ref 1.15–1.40)
HCT: 49 % — ABNORMAL HIGH (ref 36.0–46.0)
Hemoglobin: 16.7 g/dL — ABNORMAL HIGH (ref 12.0–15.0)
O2 Saturation: 94 %
Potassium: 4.8 mmol/L (ref 3.5–5.1)
Sodium: 139 mmol/L (ref 135–145)
TCO2: 32 mmol/L (ref 22–32)
pCO2, Ven: 42.6 mmHg — ABNORMAL LOW (ref 44–60)
pH, Ven: 7.472 — ABNORMAL HIGH (ref 7.25–7.43)
pO2, Ven: 68 mmHg — ABNORMAL HIGH (ref 32–45)

## 2021-05-29 LAB — CBC WITH DIFFERENTIAL/PLATELET
Abs Immature Granulocytes: 0.01 10*3/uL (ref 0.00–0.07)
Basophils Absolute: 0 10*3/uL (ref 0.0–0.1)
Basophils Relative: 0 %
Eosinophils Absolute: 0.1 10*3/uL (ref 0.0–0.5)
Eosinophils Relative: 1 %
HCT: 47.2 % — ABNORMAL HIGH (ref 36.0–46.0)
Hemoglobin: 14.8 g/dL (ref 12.0–15.0)
Immature Granulocytes: 0 %
Lymphocytes Relative: 21 %
Lymphs Abs: 1.5 10*3/uL (ref 0.7–4.0)
MCH: 28.5 pg (ref 26.0–34.0)
MCHC: 31.4 g/dL (ref 30.0–36.0)
MCV: 90.8 fL (ref 80.0–100.0)
Monocytes Absolute: 0.3 10*3/uL (ref 0.1–1.0)
Monocytes Relative: 4 %
Neutro Abs: 5.1 10*3/uL (ref 1.7–7.7)
Neutrophils Relative %: 74 %
Platelets: 192 10*3/uL (ref 150–400)
RBC: 5.2 MIL/uL — ABNORMAL HIGH (ref 3.87–5.11)
RDW: 19.4 % — ABNORMAL HIGH (ref 11.5–15.5)
WBC: 6.9 10*3/uL (ref 4.0–10.5)
nRBC: 0 % (ref 0.0–0.2)

## 2021-05-29 LAB — I-STAT BETA HCG BLOOD, ED (MC, WL, AP ONLY): I-stat hCG, quantitative: <5 m[IU]/mL (ref <5)

## 2021-05-29 LAB — LIPASE, BLOOD: Lipase: 63 U/L — ABNORMAL HIGH (ref 11–51)

## 2021-05-29 MED ORDER — ONDANSETRON HCL 4 MG/2ML IJ SOLN: 4.0000 mg | Freq: Once | INTRAMUSCULAR | Status: DC

## 2021-05-29 MED ORDER — HALOPERIDOL LACTATE 5 MG/ML IJ SOLN
1.0000 mg | Freq: Once | INTRAMUSCULAR | Status: AC
  Administered 2021-05-29: 1 mg via INTRAVENOUS
  Filled 2021-05-29: qty 1

## 2021-05-29 MED ORDER — HYDRALAZINE HCL 20 MG/ML IJ SOLN: 10.0000 mg | Freq: Once | INTRAMUSCULAR | Status: DC

## 2021-05-29 MED ORDER — PROCHLORPERAZINE EDISYLATE 10 MG/2ML IJ SOLN
10.0000 mg | Freq: Once | INTRAMUSCULAR | Status: AC
Start: 2021-05-29 — End: 2021-05-29
  Administered 2021-05-29: 10 mg via INTRAVENOUS
  Filled 2021-05-29: qty 2

## 2021-05-29 MED ORDER — ONDANSETRON 4 MG PO TBDP: 4.0000 mg | ORAL_TABLET | Freq: Once | ORAL | Status: DC

## 2021-05-29 MED ORDER — MAGNESIUM SULFATE IN D5W 1-5 GM/100ML-% IV SOLN
1.0000 g | Freq: Once | INTRAVENOUS | Status: AC
Start: 2021-05-29 — End: 2021-05-30
  Administered 2021-05-29: 1 g via INTRAVENOUS
  Filled 2021-05-29: qty 100

## 2021-05-29 MED ORDER — BENZTROPINE MESYLATE 1 MG/ML IJ SOLN
1.0000 mg | Freq: Once | INTRAMUSCULAR | Status: AC
  Administered 2021-05-29: 1 mg via INTRAVENOUS
  Filled 2021-05-29: qty 1

## 2021-05-29 MED ORDER — LACTATED RINGERS IV BOLUS
1000.0000 mL | Freq: Once | INTRAVENOUS | Status: AC
  Administered 2021-05-29: 1000 mL via INTRAVENOUS

## 2021-05-29 NOTE — ED Provider Triage Note (Addendum)
Emergency Medicine Provider Triage Evaluation Note ? ?[name] , a 36 y.o. female  was evaluated in triage.  Pt complains of headache.  She states this started during dialysis today.  She states that this was this morning.  She has not missed any sessions.  She states that she also has abdominal pain but the headache is her main concern today. ?She does not know the last time her head hurt this much.  She feels like they "messed up my dry weight." ? ?Phlebotomist reports that when he went to draw patient's blood she was putting her hand in her mouth to induce vomiting.  ? ? ?Physical Exam  ?BP (!) 220/133 (BP Location: Right Arm)   Pulse (!) 103   Temp 98.1 ?F (36.7 ?C) (Oral)   Resp 12   SpO2 96%  ?Gen:   Awake, photophobic, spitting into a vomit bag, appears uncomfortable ?Resp:  Normal effort  ?MSK:   Moves extremities without difficulty  ?Other:  Normal speech ? ?Medical Decision Making  ?Medically screening exam initiated at 7:31 PM.  Appropriate orders placed.  [name] was informed that the remainder of the evaluation will be completed by another provider, this initial triage assessment does not replace that evaluation, and the importance of remaining in the ED until their evaluation is complete. ? ? ?  ?Lorin Glass, [place] ?05/29/21 1938 ? ?

## 2021-05-29 NOTE — ED Triage Notes (Signed)
Pt BIB [facility] from home, c/o headache, hypertension, nausea and weakness that started this morning. Dialysis pt, MWF, had treatment today. EMS VS BP: 230/158, HR 100, CBG 283. ?

## 2021-05-29 NOTE — ED Notes (Signed)
This RN called CT to have them come soon to scan the patient ?

## 2021-05-29 NOTE — ED Provider Notes (Signed)
?[facility] ?Provider Note ? ?CSN: [id number] ?Arrival date & time: 05/29/21 1843 ? ?Chief Complaint(s) ?Headache ? ?HPI ?[name] is a 36 y.o. female with PMH T2DM, ESRD on hemodialysis Tuesday Thursday Saturday who presents emergency department for evaluation of headache, abdominal pain, nausea and vomiting.  Patient states that her symptoms began acutely after receiving dialysis today.  She believes that they took off too much fluid at dialysis.  She states that her abdominal pain is "all over" and she is having persistent nausea and vomiting.  Denies numbness, tingling, weakness or other neurologic complaints.  Denies chest pain, shortness of breath or other systemic complaints. ? ? ?Headache ?Associated symptoms: abdominal pain, nausea and vomiting   ? ?Past Medical History ?Past Medical History:  ?Diagnosis Date  ? Anemia   ? Blood transfusion without reported diagnosis   ? Chronic kidney disease   ? kidney transplant 07  ? Diabetes mellitus   ? Pt reports diagnosis in June 2011, Type 2  ? Diabetes mellitus without complication ([place])   ? GERD (gastroesophageal reflux disease)   ? Hyperlipidemia   ? Hypertension   ? Kidney transplant recipient 2007  ? solitary kidney  ? LEARNING DISABILITY 09/25/2007  ? Qualifier: Diagnosis of  By: Deborra Medina MD, [place]    ? 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 Pyelonephritis 06/23/2014  ? Renal disorder   ? Seasonal allergies   ? Seizures ([place])   ? UTI (urinary tract infection) 01/09/2015  ? 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  ? Qualifier: Diagnosis of  By: Carlena Sax  MD, [place]    ? ?Patient Active Problem List  ? Diagnosis Date Noted  ? Esophageal dysphagia   ? Esophageal obstruction due to food impaction   ? Esophageal stricture   ? Seizures ([place]) 10/30/2020  ? Acute upper GI bleed 08/09/2020  ? [place] grade D esophagitis 08/09/2020  ? DKA, type 2 ([place]) 07/31/2020  ? Seizure disorder ([place]) 04/29/2020  ? Polyneuropathy associated with underlying  disease ([place]) 04/29/2020  ? Renal osteodystrophy 04/22/2020  ? Hiatal hernia 04/22/2020  ? Overweight (BMI 25.0-29.9) 04/22/2020  ? Uncontrolled type 1 diabetes mellitus with hyperglycemia ([place]) 04/20/2020  ? Hyponatremia 04/20/2020  ? GERD (gastroesophageal reflux disease) 04/20/2020  ? Prolonged QT interval 04/20/2020  ? Wide-complex tachycardia   ? Allergy, unspecified, initial encounter 11/07/2019  ? Anaphylactic shock, unspecified, initial encounter 11/07/2019  ? Hypotension 10/16/2019  ? Hyperkalemia 10/11/2019  ? Intractable abdominal pain 06/19/2019  ? Sinus tachycardia 06/19/2019  ? Renal and perinephric abscess 11/01/2018  ? Intra-abdominal abscess ([place]) 10/28/2018  ? ESRD on hemodialysis ([place]) 05/01/2018  ? Shortness of breath 04/20/2018  ? Iron deficiency anemia, unspecified 04/03/2018  ? Coagulation defect, unspecified ([place]) 03/27/2018  ? Complication of vascular dialysis catheter 03/27/2018  ? Dependence on renal dialysis ([place]) 03/27/2018  ? Kidney transplant failure 03/27/2018  ? Renal sclerosis, unspecified 03/27/2018  ? Renal failure 03/18/2018  ? Cellulitis of left leg 03/01/2018  ? Hyperlipidemia 03/01/2018  ? Hypocalcemia 03/01/2018  ? Metabolic acidosis 24/23/5361  ? Incontinence of bowel 02/03/2018  ? Chronic pain 11/11/2017  ? Constipation 07/13/2017  ? Hematemesis 07/02/2017  ? Dehydration 07/02/2017  ? Chronic cholecystitis 06/29/2017  ? Type 1 diabetes mellitus with complication, uncontrolled 05/25/2015  ? Intractable nausea and vomiting 01/09/2015  ? Renal transplant recipient   ? Immunosuppressed status ([place])   ? ESRD (end stage renal disease) ([place]) 09/30/2014  ? Chest pain 12/22/2012  ? 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 Chronic kidney disease 01/04/2010  ? OVARIAN FAILURE, PREMATURE 03/11/2009  ? 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  ? 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 OBESITY 09/25/2007  ? Anemia due to  chronic kidney disease 09/25/2007  ? LEARNING DISABILITY 09/25/2007  ? Essential hypertension, benign 09/25/2007  ? ?Home Medication(s) ?Prior to Admission medications   ?Medication Sig Start Date End Date Taking? Authorizing Provider  ?ACCU-CHEK GUIDE test strip USE TO TEST THREE TIMES DAILY FOR GLUCOSE TESTING 12/30/20   [provider]  ?ACCU-CHEK SOFTCLIX LANCETS lancets Use to check blood sugar 4 times per day. 12/29/15   Renato Shin, MD  ?acetaminophen (TYLENOL) 500 MG tablet Take 500-1,000 mg by mouth every 6 (six) hours as needed for moderate pain.    [provider]  ?BD PEN NEEDLE NANO 2ND GEN 32G X 4 MM MISC 4 (four) times daily. as directed 08/13/20   [provider]  ?calcium acetate (PHOSLO) 667 MG capsule Take 1,334 mg by mouth 3 (three) times daily with meals. Take 1-2 capsules (812-7517 mg) by mouth with snacks & take 2 capsules (1334 mg) by mouth with each meal 10/26/20   [provider]  ?Calcium Carbonate Antacid (CALCIUM CARBONATE, DOSED IN MG ELEMENTAL CALCIUM,) 1250 MG/5ML SUSP Take 500 mg of elemental calcium by mouth daily. 10/22/20   [provider]  ?cetirizine (ZYRTEC) 10 MG tablet Take 10 mg by mouth daily.    [provider]  ?famotidine (PEPCID) 40 MG tablet TAKE 1 TABLET BY MOUTH TWICE DAILY IN THE MORNING AND AT BEDTIME 03/17/21   Gatha Mayer, MD  ?fluticasone [facility]) 50 MCG/ACT nasal spray Place 2 sprays into both nostrils daily as needed for allergies. 09/03/18   [name], MD  ?gabapentin (NEURONTIN) 400 MG capsule Take 800-1,200 mg by mouth at bedtime. 12/30/20   [provider]  ?hydrOXYzine (ATARAX/VISTARIL) 50 MG tablet Take 50 mg by mouth 2 (two) times daily as needed for anxiety. 11/03/20   [provider]  ?insulin aspart (NOVOLOG FLEXPEN) 100 UNIT/ML FlexPen Inject 0-15 Units into the skin in the morning, at noon, in the evening, and at bedtime. Sliding Scale insulin    [provider]   ?insulin degludec (TRESIBA) 100 UNIT/ML FlexTouch Pen Inject 20 Units into the skin daily. ?Patient taking differently: Inject 20 Units into the skin daily. 02/15/20   Dwyane Dee, MD  ?Insulin Pen Needle (BD PEN NEEDLE NANO 2ND GEN) 32G X 4 MM MISC 1 each by Does not apply route in the morning, at noon, in the evening, and at bedtime. 08/10/20   Barb Merino, MD  ?Insulin Pen Needle 32G X 4 MM MISC Use as directed 11/06/20   Shelly Coss, MD  ?lidocaine (XYLOCAINE) 2 % solution Use as directed 15 mLs in the mouth or throat every 6 (six) hours as needed for mouth pain. 11/11/20   Fatima Blank, MD  ?lidocaine-prilocaine (EMLA) cream Apply 1 application topically daily as needed [facility]). 12/17/20   [provider]  ?loperamide (IMODIUM) 2 MG capsule Take 2 mg by mouth as needed for diarrhea or loose stools. 09/11/20   [provider]  ?metoCLOPramide (REGLAN) 10 MG tablet Take 1 tablet (10 mg total) by mouth 4 (four) times daily -  before meals and at bedtime. 03/17/21   Gatha Mayer, MD  ?metoprolol tartrate (LOPRESSOR) 25 MG tablet Take 1 tablet (25 mg total) by mouth 2 (two) times daily. ?Patient taking  differently: Take 25 mg by mouth in the morning. 02/15/20   Dwyane Dee, MD  ?omeprazole (PRILOSEC) 40 MG capsule Take 1 capsule (40 mg total) by mouth 2 (two) times daily before a meal. Open capsule and place granules in water or applesauce to swallow 03/17/21   Gatha Mayer, MD  ?ondansetron (ZOFRAN-ODT) 4 MG disintegrating tablet Take 4 mg by mouth every 8 (eight) hours as needed for vomiting or nausea. 11/04/20   [provider]  ?oxyCODONE (ROXICODONE) 5 MG immediate release tablet Take 1 tablet (5 mg total) by mouth every 4 (four) hours as needed for severe pain. 02/06/21   Henderly, Britni A, PA-C  ?oxyCODONE-acetaminophen (PERCOCET/ROXICET) 5-325 MG tablet Take 0.25 tablets by mouth 2 (two) times daily as needed (leg pain). 02/20/21   [provider]  ?silver  sulfADIAZINE (SILVADENE) 1 % cream Apply 1 application topically daily. 02/06/21   Henderly, Britni A, PA-C  ?simvastatin (ZOCOR) 20 MG tablet Take 20 mg by mouth at bedtime.    [provider]  ?

## 2021-05-30 ENCOUNTER — Emergency Department (HOSPITAL_COMMUNITY): Payer: Medicare Other

## 2021-05-30 DIAGNOSIS — R109 Unspecified abdominal pain: Secondary | ICD-10-CM

## 2021-05-30 DIAGNOSIS — Z833 Family history of diabetes mellitus: Secondary | ICD-10-CM | POA: Diagnosis not present

## 2021-05-30 DIAGNOSIS — E1165 Type 2 diabetes mellitus with hyperglycemia: Secondary | ICD-10-CM | POA: Diagnosis present

## 2021-05-30 DIAGNOSIS — E874 Mixed disorder of acid-base balance: Secondary | ICD-10-CM | POA: Diagnosis present

## 2021-05-30 DIAGNOSIS — Z20822 Contact with and (suspected) exposure to covid-19: Secondary | ICD-10-CM | POA: Diagnosis present

## 2021-05-30 DIAGNOSIS — E875 Hyperkalemia: Secondary | ICD-10-CM | POA: Diagnosis present

## 2021-05-30 DIAGNOSIS — Z992 Dependence on renal dialysis: Secondary | ICD-10-CM | POA: Diagnosis not present

## 2021-05-30 DIAGNOSIS — N186 End stage renal disease: Secondary | ICD-10-CM

## 2021-05-30 DIAGNOSIS — R9431 Abnormal electrocardiogram [ECG] [EKG]: Secondary | ICD-10-CM | POA: Diagnosis present

## 2021-05-30 DIAGNOSIS — Z886 Allergy status to analgesic agent status: Secondary | ICD-10-CM | POA: Diagnosis not present

## 2021-05-30 DIAGNOSIS — K219 Gastro-esophageal reflux disease without esophagitis: Secondary | ICD-10-CM | POA: Diagnosis present

## 2021-05-30 DIAGNOSIS — R112 Nausea with vomiting, unspecified: Secondary | ICD-10-CM | POA: Diagnosis not present

## 2021-05-30 DIAGNOSIS — I160 Hypertensive urgency: Secondary | ICD-10-CM | POA: Diagnosis present

## 2021-05-30 DIAGNOSIS — T8612 Kidney transplant failure: Secondary | ICD-10-CM | POA: Diagnosis present

## 2021-05-30 DIAGNOSIS — Y830 Surgical operation with transplant of whole organ as the cause of abnormal reaction of the patient, or of later complication, without mention of misadventure at the time of the procedure: Secondary | ICD-10-CM | POA: Diagnosis present

## 2021-05-30 DIAGNOSIS — E785 Hyperlipidemia, unspecified: Secondary | ICD-10-CM | POA: Diagnosis present

## 2021-05-30 DIAGNOSIS — D631 Anemia in chronic kidney disease: Secondary | ICD-10-CM | POA: Diagnosis present

## 2021-05-30 DIAGNOSIS — Z888 Allergy status to other drugs, medicaments and biological substances status: Secondary | ICD-10-CM | POA: Diagnosis not present

## 2021-05-30 DIAGNOSIS — Z91013 Allergy to seafood: Secondary | ICD-10-CM | POA: Diagnosis not present

## 2021-05-30 DIAGNOSIS — I120 Hypertensive chronic kidney disease with stage 5 chronic kidney disease or end stage renal disease: Secondary | ICD-10-CM | POA: Diagnosis present

## 2021-05-30 DIAGNOSIS — E1122 Type 2 diabetes mellitus with diabetic chronic kidney disease: Secondary | ICD-10-CM | POA: Diagnosis present

## 2021-05-30 DIAGNOSIS — N2581 Secondary hyperparathyroidism of renal origin: Secondary | ICD-10-CM | POA: Diagnosis present

## 2021-05-30 DIAGNOSIS — E1065 Type 1 diabetes mellitus with hyperglycemia: Secondary | ICD-10-CM

## 2021-05-30 DIAGNOSIS — E1140 Type 2 diabetes mellitus with diabetic neuropathy, unspecified: Secondary | ICD-10-CM | POA: Diagnosis present

## 2021-05-30 DIAGNOSIS — Z91041 Radiographic dye allergy status: Secondary | ICD-10-CM | POA: Diagnosis not present

## 2021-05-30 DIAGNOSIS — Z905 Acquired absence of kidney: Secondary | ICD-10-CM | POA: Diagnosis not present

## 2021-05-30 DIAGNOSIS — Z8249 Family history of ischemic heart disease and other diseases of the circulatory system: Secondary | ICD-10-CM | POA: Diagnosis not present

## 2021-05-30 LAB — BASIC METABOLIC PANEL
Anion gap: 18 — ABNORMAL HIGH (ref 5–15)
BUN: 32 mg/dL — ABNORMAL HIGH (ref 6–20)
CO2: 25 mmol/L (ref 22–32)
Calcium: 9.1 mg/dL (ref 8.9–10.3)
Chloride: 95 mmol/L — ABNORMAL LOW (ref 98–111)
Creatinine, Ser: 6.72 mg/dL — ABNORMAL HIGH (ref 0.44–1.00)
GFR, Estimated: 8 mL/min — ABNORMAL LOW (ref >60)
Glucose, Bld: 351 mg/dL — ABNORMAL HIGH (ref 70–99)
Potassium: 5.6 mmol/L — ABNORMAL HIGH (ref 3.5–5.1)
Sodium: 138 mmol/L (ref 135–145)

## 2021-05-30 LAB — CBG MONITORING, ED
Glucose-Capillary: 373 mg/dL — ABNORMAL HIGH (ref 70–99)
Glucose-Capillary: 370 mg/dL — ABNORMAL HIGH (ref 70–99)
Glucose-Capillary: 378 mg/dL — ABNORMAL HIGH (ref 70–99)
Glucose-Capillary: 360 mg/dL — ABNORMAL HIGH (ref 70–99)
Glucose-Capillary: 174 mg/dL — ABNORMAL HIGH (ref 70–99)

## 2021-05-30 LAB — HEMOGLOBIN A1C
Hgb A1c MFr Bld: 8 % — ABNORMAL HIGH (ref 4.8–5.6)
Mean Plasma Glucose: 182.9 mg/dL

## 2021-05-30 LAB — BETA-HYDROXYBUTYRIC ACID: Beta-Hydroxybutyric Acid: 0.12 mmol/L (ref 0.05–0.27)

## 2021-05-30 LAB — RESP PANEL BY RT-PCR (FLU A&B, COVID) ARPGX2
Influenza A by PCR: NEGATIVE
Influenza B by PCR: NEGATIVE
SARS Coronavirus 2 by RT PCR: NEGATIVE

## 2021-05-30 LAB — GLUCOSE, CAPILLARY
Glucose-Capillary: 157 mg/dL — ABNORMAL HIGH (ref 70–99)
Glucose-Capillary: 164 mg/dL — ABNORMAL HIGH (ref 70–99)

## 2021-05-30 LAB — MAGNESIUM: Magnesium: 2.5 mg/dL — ABNORMAL HIGH (ref 1.7–2.4)

## 2021-05-30 MED ORDER — LABETALOL HCL 5 MG/ML IV SOLN
10.0000 mg | Freq: Once | INTRAVENOUS | Status: AC
  Administered 2021-05-30: 10 mg via INTRAVENOUS
  Filled 2021-05-30: qty 4

## 2021-05-30 MED ORDER — HEPARIN SODIUM (PORCINE) 5000 UNIT/ML IJ SOLN
5000.0000 [IU] | Freq: Three times a day (TID) | INTRAMUSCULAR | Status: DC
  Administered 2021-05-30 – 2021-05-31 (×4): 5000 [IU] via SUBCUTANEOUS
  Filled 2021-05-30 (×4): qty 1

## 2021-05-30 MED ORDER — INSULIN GLARGINE-YFGN 100 UNIT/ML ~~LOC~~ SOLN
7.0000 [IU] | Freq: Every day | SUBCUTANEOUS | Status: DC
  Administered 2021-05-31: 7 [IU] via SUBCUTANEOUS
  Filled 2021-05-30 (×2): qty 0.07

## 2021-05-30 MED ORDER — ONDANSETRON HCL 4 MG/2ML IJ SOLN
4.0000 mg | Freq: Four times a day (QID) | INTRAMUSCULAR | Status: DC | PRN
  Filled 2021-05-30: qty 2

## 2021-05-30 MED ORDER — LABETALOL HCL 5 MG/ML IV SOLN
20.0000 mg | Freq: Once | INTRAVENOUS | Status: AC
  Administered 2021-05-30: 20 mg via INTRAVENOUS
  Filled 2021-05-30: qty 4

## 2021-05-30 MED ORDER — ONDANSETRON HCL 4 MG PO TABS: 4.0000 mg | ORAL_TABLET | Freq: Four times a day (QID) | ORAL | Status: DC | PRN

## 2021-05-30 MED ORDER — INSULIN ASPART 100 UNIT/ML IJ SOLN: 8.0000 [IU] | Freq: Once | INTRAMUSCULAR | Status: DC

## 2021-05-30 MED ORDER — ACETAMINOPHEN 325 MG PO TABS: 650.0000 mg | ORAL_TABLET | Freq: Four times a day (QID) | ORAL | Status: DC | PRN

## 2021-05-30 MED ORDER — PROMETHAZINE HCL 25 MG/ML IJ SOLN
12.5000 mg | Freq: Four times a day (QID) | INTRAMUSCULAR | Status: DC | PRN
  Administered 2021-05-30: 12.5 mg via INTRAVENOUS
  Filled 2021-05-30 (×2): qty 0.5

## 2021-05-30 MED ORDER — INSULIN ASPART 100 UNIT/ML IJ SOLN
0.0000 [IU] | INTRAMUSCULAR | Status: DC
  Administered 2021-05-30 (×2): 2 [IU] via SUBCUTANEOUS
  Administered 2021-05-30 (×2): 9 [IU] via SUBCUTANEOUS
  Administered 2021-05-30: 2 [IU] via SUBCUTANEOUS
  Administered 2021-05-31 (×2): 1 [IU] via SUBCUTANEOUS

## 2021-05-30 MED ORDER — ACETAMINOPHEN 650 MG RE SUPP: 650.0000 mg | Freq: Four times a day (QID) | RECTAL | Status: DC | PRN

## 2021-05-30 MED ORDER — SODIUM ZIRCONIUM CYCLOSILICATE 10 G PO PACK
10.0000 g | PACK | Freq: Once | ORAL | Status: DC
Start: 2021-05-30 — End: 2021-05-31

## 2021-05-30 MED ORDER — HYDROMORPHONE HCL 1 MG/ML IJ SOLN
0.5000 mg | INTRAMUSCULAR | Status: DC | PRN
  Administered 2021-05-30 – 2021-05-31 (×2): 0.5 mg via INTRAVENOUS
  Filled 2021-05-30: qty 0.5
  Filled 2021-05-30: qty 1

## 2021-05-30 MED ORDER — MORPHINE SULFATE (PF) 2 MG/ML IV SOLN
2.0000 mg | INTRAVENOUS | Status: DC | PRN
  Administered 2021-05-30: 2 mg via INTRAVENOUS
  Filled 2021-05-30: qty 1

## 2021-05-30 MED ORDER — LABETALOL HCL 5 MG/ML IV SOLN
10.0000 mg | INTRAVENOUS | Status: DC | PRN
  Administered 2021-05-30: 10 mg via INTRAVENOUS

## 2021-05-30 MED ORDER — LABETALOL HCL 5 MG/ML IV SOLN
10.0000 mg | INTRAVENOUS | Status: DC | PRN
  Administered 2021-05-30: 15 mg via INTRAVENOUS
  Filled 2021-05-30: qty 4

## 2021-05-30 MED ORDER — METOPROLOL TARTRATE 25 MG PO TABS
25.0000 mg | ORAL_TABLET | Freq: Once | ORAL | Status: AC
  Administered 2021-05-30: 25 mg via ORAL
  Filled 2021-05-30: qty 1

## 2021-05-30 NOTE — Assessment & Plan Note (Signed)
NPO ?PRN nausea meds ?

## 2021-05-30 NOTE — Consult Note (Signed)
[facility] ?Renal Consultation Note  ?  ?Indication for Consultation:  Management of ESRD/hemodialysis; anemia, hypertension/volume and secondary hyperparathyroidism ? ?UEA:[id number], Christean Grief, MD ? ?HPI: [name] is a 36 y.o. female with ESRD on HD MWF at [facility]. Her past medical history is significant for T1DM with neuropathy and gastroparesis, HTN, pseudoseizures, and  ?failed KT (initially kept on  MMF/tac/pred, stopped after Tx nephrectomy), and esophageal strictures.  ? ?Patient presents to the ED c/o ABD pain, N/V, and headaches after full HD treatment on 05/29/21. Reviewed records in Epic and outpatient HD center. Gastric emptying study (-) gastroparesis on 04/23/21. Her EDW was recently raised d/t episodes of hypotension. Looks like she reached her EDW during last HD. Seen and examined patient at bedside. Patient sleeping but easily arousable and answers my questions appropriately. She endorses ABD pain. Denies SOB, CP, N/V, and remains on RA. Noted high blood pressures and tachycardia. Lab work includes: K+ 5.6, BUN 32, SrCr 6.72, Ca 9.1, Hgb 16.7. CXR shows mild interstitial edema.  No need for HD today. Plan for HD 06/01/21 per her routine schedule. ? ?Past Medical History:  ?Diagnosis Date  ? Anemia   ? Blood transfusion without reported diagnosis   ? Chronic kidney disease   ? kidney transplant 07  ? Diabetes mellitus   ? Pt reports diagnosis in June 2011, Type 2  ? Diabetes mellitus without complication ([place])   ? GERD (gastroesophageal reflux disease)   ? Hyperlipidemia   ? Hypertension   ? Kidney transplant recipient 2007  ? solitary kidney  ? LEARNING DISABILITY 09/25/2007  ? Qualifier: Diagnosis of  By: Deborra Medina MD, [place]    ? 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 Pyelonephritis 06/23/2014  ? Renal disorder   ? Seasonal allergies   ? Seizures ([place])   ? UTI (urinary tract infection) 01/09/2015  ? 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  ? Qualifier: Diagnosis of  By: Carlena Sax  MD, [place]     ? ?Past Surgical History:  ?Procedure Laterality Date  ? ARTERIOVENOUS GRAFT PLACEMENT Bilateral   ? "neither work" (10/24/2017)  ? AV FISTULA PLACEMENT Left 10/26/2018  ? Procedure: CREATION OF ARTERIOVENOUS FISTULA  LEFT ARM;  Surgeon: [name], MD;  Location: [facility];  Service: Vascular;  Laterality: Left;  ? AV FISTULA PLACEMENT Left 05/23/2020  ? Procedure: LEFT ARM ARTERIOVENOUS (AV) FISTULA CREATION;  Surgeon: [name], MD;  Location: [facility];  Service: Vascular;  Laterality: Left;  ? BALLOON DILATION N/A 01/19/2021  ? Procedure: BALLOON DILATION;  Surgeon: Gatha Mayer, MD;  Location: [facility];  Service: Endoscopy;  Laterality: N/A;  ? BALLOON DILATION N/A 02/10/2021  ? Procedure: BALLOON DILATION;  Surgeon: Irene Shipper, MD;  Location: [facility];  Service: Endoscopy;  Laterality: N/A;  ? BALLOON DILATION N/A 02/19/2021  ? Procedure: BALLOON DILATION;  Surgeon: Sharyn Creamer, MD;  Location: [facility];  Service: Gastroenterology;  Laterality: N/A;  ? BASCILIC VEIN TRANSPOSITION Left 12/21/2018  ? Procedure: Left arm BASILIC VEIN TRANSPOSITION SECOND STAGE;  Surgeon: [name], MD;  Location: [facility];  Service: Vascular;  Laterality: Left;  ? CHOLECYSTECTOMY N/A 06/30/2017  ? Procedure: LAPAROSCOPIC CHOLECYSTECTOMY WITH INTRAOPERATIVE CHOLANGIOGRAM;  Surgeon: Excell Seltzer, MD;  Location: [facility];  Service: General;  Laterality: N/A;  ? ESOPHAGOGASTRODUODENOSCOPY (EGD) WITH PROPOFOL N/A 07/04/2017  ? Procedure: ESOPHAGOGASTRODUODENOSCOPY (EGD) WITH PROPOFOL;  Surgeon: Clarene Essex, MD;  Location: [facility];  Service: Endoscopy;  Laterality: N/A;  ? ESOPHAGOGASTRODUODENOSCOPY (EGD)  WITH PROPOFOL N/A 08/10/2020  ? Procedure: ESOPHAGOGASTRODUODENOSCOPY (EGD) WITH PROPOFOL;  Surgeon: Doran Stabler, MD;  Location: [facility];  Service: Gastroenterology;  Laterality: N/A;  ? ESOPHAGOGASTRODUODENOSCOPY (EGD) WITH PROPOFOL N/A 01/19/2021  ? Procedure:  ESOPHAGOGASTRODUODENOSCOPY (EGD) WITH PROPOFOL;  Surgeon: Gatha Mayer, MD;  [facility];  Service: Endoscopy;  Laterality: N/A;  WITH FLUOROSCOPY AND DILATION  ? ESOPHAGOGASTRODUODENOSCOPY (EGD) WITH PROPOFOL N/A 02/03/2021  ? Procedure: ESOPHAGOGASTRODUODENOSCOPY (EGD) WITH PROPOFOL;  Surgeon: Gatha Mayer, MD;  [facility];  Service: Endoscopy;  Laterality: N/A;  ? ESOPHAGOGASTRODUODENOSCOPY (EGD) WITH PROPOFOL N/A 02/10/2021  ? Procedure: ESOPHAGOGASTRODUODENOSCOPY (EGD) WITH PROPOFOL;  Surgeon: Irene Shipper, MD;  [facility];  Service: Endoscopy;  Laterality: N/A;  Balloon Dilation  ? ESOPHAGOGASTRODUODENOSCOPY (EGD) WITH PROPOFOL N/A 02/19/2021  ? Procedure: ESOPHAGOGASTRODUODENOSCOPY (EGD) WITH PROPOFOL;  Surgeon: Sharyn Creamer, MD;  [facility]: [facility];  Service: Gastroenterology;  Laterality: N/A;  ? ESOPHAGOGASTRODUODENOSCOPY (EGD) WITH PROPOFOL N/A 03/12/2021  ? Procedure: ESOPHAGOGASTRODUODENOSCOPY (EGD) WITH PROPOFOL;  Surgeon: Lavena Bullion, DO;  Location: [facility];  Service: Gastroenterology;  Laterality: N/A;  ? FOREIGN BODY REMOVAL N/A 02/03/2021  ? Procedure: FOREIGN BODY REMOVAL;  Surgeon: Gatha Mayer, MD;  Location: [facility];  Service: Endoscopy;  Laterality: N/A;  ? INSERTION OF DIALYSIS CATHETER N/A 03/20/2018  ? Procedure: INSERTION OF TUNNELED DIALYSIS CATHETER - RIGHT INTERANL JUGULAR PLACEMENT;  Surgeon: Angelia Mould, MD;  Location: [facility];  Service: Vascular;  Laterality: N/A;  ? IR FLUORO GUIDE CV LINE RIGHT  04/18/2020  ? IR GUIDED DRAIN W CATHETER PLACEMENT  10/28/2018  ? KIDNEY TRANSPLANT  2007  ? KIDNEY TRANSPLANT Right   ? PARATHYROIDECTOMY  ?2012  ? "3/4 removed" (10/24/2017)  ? RENAL BIOPSY Bilateral 2003  ? REVISON OF ARTERIOVENOUS FISTULA Left 09/24/2020  ? Procedure: LEFT UPPER ARM ARTERIOVENOUS GRAFT CREATION;  Surgeon: [name], MD;  Location: [facility];  Service: Vascular;  Laterality: Left;  ? UPPER  EXTREMITY VENOGRAPHY Bilateral 10/19/2018  ? Procedure: UPPER EXTREMITY VENOGRAPHY;  Surgeon: [name], MD;  Location: [facility];  Service: Cardiovascular;  Laterality: Bilateral;  Bilateral ?  ? UPPER EXTREMITY VENOGRAPHY Left 09/04/2020  ? Procedure: UPPER EXTREMITY VENOGRAPHY - Left Upper;  Surgeon: [name], MD;  Location: [facility];  Service: Cardiovascular;  Laterality: Left;  ? ?Family History  ?Problem Relation Age of Onset  ? Arthritis Mother   ? Hypertension Mother   ? Aneurysm Mother   ?     died of brain aneurysm  ? CAD Father   ?     Has 3 stents  ? Diabetes Father   ?     borderline  ? Early death Brother   ?     Died in war  ? Colon cancer Neg Hx   ? Esophageal cancer Neg Hx   ? Rectal cancer Neg Hx   ? Stomach cancer Neg Hx   ? ?Social History: ? reports that she has never smoked. She has never been exposed to tobacco smoke. She has never used smokeless tobacco. She reports that she does not currently use alcohol. She reports that she does not use drugs. ?Allergies  ?Allergen Reactions  ? Diphenhydramine Anaphylaxis  ? Motrin [Ibuprofen] Shortness Of Breath and Itching  ? Contrast Media [Iodinated Contrast Media] Itching  ? Shellfish Allergy Hives  ? Banana Itching, Nausea And Vomiting and Other (See Comments)  ?  Sick on  the stomach  ? Chlorhexidine Itching  ? Ferrous Sulfate Itching  ? Iron Dextran Itching and Other (See Comments)  ?  Vein irritation ?  ? ?Prior to Admission medications   ?Medication Sig Start Date End Date Taking? Authorizing Provider  ?ACCU-CHEK GUIDE test strip USE TO TEST THREE TIMES DAILY FOR GLUCOSE TESTING 12/30/20   [provider]  ?ACCU-CHEK SOFTCLIX LANCETS lancets Use to check blood sugar 4 times per day. 12/29/15   Renato Shin, MD  ?acetaminophen (TYLENOL) 500 MG tablet Take 500-1,000 mg by mouth every 6 (six) hours as needed for moderate pain.    [provider]  ?BD PEN NEEDLE NANO 2ND GEN 32G X 4 MM MISC 4  (four) times daily. as directed 08/13/20   [provider]  ?calcium acetate (PHOSLO) 667 MG capsule Take 1,334 mg by mouth 3 (three) times daily with meals. Take 1-2 capsules [phone] mg) by mouth with snacks & take

## 2021-05-30 NOTE — Assessment & Plan Note (Signed)
PH = slightly alkalotic ?Bicarb nl ?AG 16 ?Doubt significant DKA at this point ?Will order BHB to exclude though. ?Will put pt on tresiba 7u daily (her home dose) ?+ sensitive SSI Q4H of novolog. ?

## 2021-05-30 NOTE — Assessment & Plan Note (Signed)
Pt with headache, N/V, very severe HTN despite dialysis yesterday. ?1. Unable to tolerate PO meds ?2. PRN labetalol 10-20mg  IV Q2H PRN ordered ?3. Has tachycardia so dont really want to order hydralazine right now. ?4. If unable to control BP adequately with labetalol, may need to proceed to cardene gtt. ?

## 2021-05-30 NOTE — Assessment & Plan Note (Signed)
Just had dialysis yesterday. ?Call nephro in AM for routine dialysis during IP stay if not discharged today (which seems unlikely) ?

## 2021-05-30 NOTE — Progress Notes (Signed)
?PROGRESS NOTE ? ? ? ?[name]  TWS:[id number] DOB: 04-29-1985 DOA: 05/29/2021 ?PCP: [name], MD ? ? ?Brief Narrative:  ?HPI: [name] is a 36 y.o. female with medical history significant of DM, pseudoseizures, HTN, ESRD.Pt presents to ED with c/o abd pain, N/V, and headache following dialysis yesterday.Recent gastric emptying study was normal (negative for gastroparesis) in Feb. ?Has h/o esophageal strictures, she says that abd pain she is currently having is different than her esophageal stricture symptoms, but the abd pain is the same as prior abd pain episodes that she gets frequently.She believes that they took off too much fluid at dialysis. ? ?Upon arrival to ED: Patient's blood pressure 220/133, heart rate 110s.  Chest x-ray shows suspected mild interstitial edema.  Right lower lobe predominant less likely multifocal pneumonia.CT abdomen shows bilateral lobe patchy groundglass airspace opacities.  Similar appearing right pericolic gutter and right inguinal region surgical changes.  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  Patient was given IV Compazine, metoprolol 25 mg, IV labetalol 10 mg, IV fluid bolus in the ED.  [facility] hospitalist consulted for further evaluation and management. ? ? ?Assessment & Plan: ?  ?Hypertension urgency: ?-Patient presented with severe headache, nausea, vomiting.  Blood pressure noted to be 220/133 upon arrival to the hospital. ?-Labetalol 10 mg once given in the ED. ?-Continue labetalol 10 to 20 mg IV every 2 hours as needed ?-Monitor blood pressure closely. ?-If her heart rate and blood pressure remains elevated will consider start on Cardene drip ? ?Intractable abdominal pain: ?-Lipase: Slightly elevated at 63. ?-CT abdomen/pelvis: Negative for any acute findings.  Gastric emptying studies negative recently. ?-We will keep her n.p.o. ?-Phenergan as needed for nausea. ?-As needed Dilaudid for pain control ? ?End-stage renal disease on hemodialysis: ?-She  gets hemodialysis on MWF ?-Consulted nephrology ? ?Hyperkalemia: ?-Potassium 5.6.  Lokelma once given. ?-Discussed with nephrology-no need of urgent dialysis at this time  ?-Regular insulin 8 units given.  Repeat BMP tomorrow a.m. ?-Tall T waves noted on EKG ?-Monitor closely on telemetry. ? ?Prolonged QT interval: Noted on EKG. ?-Avoid medications that prolong QT interval. ?-Replenish electrolytes.  Monitor closely on telemetry ? ?Type 2 diabetes mellitus with hyperglycemia: A1c is pending.  Last A1c on 8/24 was 9.2%. ?Anion gap metabolic acidosis: Anion gap 18.  Bicarb: 25.  Beta hydroxybutyric acid: WNL ?-Continue sliding scale insulin ?-Monitor blood sugar closely ? ?Chronic left foot wound: ?-Followed by wound care outpatient once in a week for dressing change ? ?DVT prophylaxis: Heparin ?Code Status: Full code ?Family Communication:  None present at bedside.  Plan of care discussed with patient in length and she verbalized understanding and agreed with it. ?Disposition Plan: To be determined ? ?Consultants:  ?Nephrology ? ?Procedures:  ?None ? ?Antimicrobials:  ?None ? ?Status is: Observation ? ? ?Subjective: ?Patient seen and examined in ED.  Sitting comfortably on the bed.  Appears sleepy but arousable with verbal command.  Reports that she continues to have nausea and vomiting and abdominal pain.  She denies chest pain or severe headache at this time.  No fever.  Tells me that she never missed dialysis appointments.  No history of tobacco abuse, alcohol abuse, illicit drug use.  She is followed by wound care for chronic left foot wound. ? ?Objective: ?Vitals:  ? 05/30/21 0500 05/30/21 0600 05/30/21 0700 05/30/21 0730  ?BP: (!) 191/123 (!) 188/125 (!) 179/121 (!) 199/132  ?Pulse: (!) 112 (!) 110 (!) 108 (!) 115  ?Resp: Marland Kitchen)  22 (!) 23 (!) 22 (!) 23  ?Temp:      ?TempSrc:      ?SpO2: 100% 95% 98% 94%  ? ? ?Intake/Output Summary (Last 24 hours) at 05/30/2021 8546 ?Last data filed at 05/30/2021 0152 ?Gross per 24  hour  ?Intake 1100 ml  ?Output --  ?Net 1100 ml  ? ?There were no vitals filed for this visit. ? ?Examination: ? ?General exam: Appears calm and comfortable, on room air, sleepy but arousable with verbal command ?Respiratory system: Clear to auscultation. Respiratory effort normal. ?Cardiovascular system: Tachycardic,, RRR. No JVD, murmurs, rubs, gallops or clicks. No pedal edema. ?Gastrointestinal system: Abdomen soft, mild epigastric tenderness positive, no guarding, no rigidity, bowel sounds positive ?Central nervous system: Patient is sleepy but arousable and following commands ? ? ?Data Reviewed: I have personally reviewed following labs and imaging studies ? ?CBC: ?Recent Labs  ?Lab 05/29/21 ?1948 05/29/21 ?1957  ?WBC 6.9  --   ?NEUTROABS 5.1  --   ?HGB 14.8 16.7*  ?HCT 47.2* 49.0*  ?MCV 90.8  --   ?PLT 192  --   ? ?Basic Metabolic Panel: ?Recent Labs  ?Lab 05/29/21 ?1948 05/29/21 ?1957  ?NA 137 139  ?K 4.8 4.8  ?CL 95*  --   ?CO2 26  --   ?GLUCOSE 284*  --   ?BUN 19  --   ?CREATININE 5.52*  --   ?CALCIUM 10.5*  --   ? ?GFR: ?CrCl cannot be calculated (Unknown ideal weight.). ?Liver Function Tests: ?Recent Labs  ?Lab 05/29/21 ?1948  ?AST 21  ?ALT 14  ?ALKPHOS 90  ?BILITOT 0.7  ?PROT 10.2*  ?ALBUMIN 4.5  ? ?Recent Labs  ?Lab 05/29/21 ?1948  ?LIPASE 63*  ? ?No results for input(s): AMMONIA in the last 168 hours. ?Coagulation Profile: ?No results for input(s): INR, PROTIME in the last 168 hours. ?Cardiac Enzymes: ?No results for input(s): CKTOTAL, CKMB, CKMBINDEX, TROPONINI in the last 168 hours. ?BNP (last 3 results) ?No results for input(s): PROBNP in the last 8760 hours. ?HbA1C: ?No results for input(s): HGBA1C in the last 72 hours. ?CBG: ?Recent Labs  ?Lab 05/30/21 ?0351 05/30/21 ?2703 05/30/21 ?5009 05/30/21 ?0755  ?GLUCAP 373* 370* 378* 360*  ? ?Lipid Profile: ?No results for input(s): CHOL, HDL, LDLCALC, TRIG, CHOLHDL, LDLDIRECT in the last 72 hours. ?Thyroid Function Tests: ?No results for input(s): TSH,  T4TOTAL, FREET4, T3FREE, THYROIDAB in the last 72 hours. ?Anemia Panel: ?No results for input(s): VITAMINB12, FOLATE, FERRITIN, TIBC, IRON, RETICCTPCT in the last 72 hours. ?Sepsis Labs: ?No results for input(s): PROCALCITON, LATICACIDVEN in the last 168 hours. ? ?Recent Results (from the past 240 hour(s))  ?Resp Panel by RT-PCR (Flu A&B, Covid) Nasopharyngeal Swab     Status: None  ? Collection Time: 05/30/21  4:01 AM  ? Specimen: Nasopharyngeal Swab; Nasopharyngeal(NP) swabs in vial transport medium  ?Result Value Ref Range Status  ? SARS Coronavirus 2 by RT PCR NEGATIVE NEGATIVE Final  ?  Comment: (NOTE) ?SARS-CoV-2 target nucleic acids are NOT DETECTED. ? ?The SARS-CoV-2 RNA is generally detectable in upper respiratory ?specimens during the acute phase of infection. The lowest ?concentration of SARS-CoV-2 viral copies this assay can detect is ?138 copies/mL. A negative result does not preclude SARS-Cov-2 ?infection and should not be used as the sole basis for treatment or ?other patient management decisions. A negative result may occur with  ?improper specimen collection/handling, submission of specimen other ?than nasopharyngeal swab, presence of viral mutation(s) within the ?areas targeted by this assay,  and inadequate number of viral ?copies(<138 copies/mL). A negative result must be combined with ?clinical observations, patient history, and epidemiological ?information. The expected result is Negative. ? ?Fact Sheet for Patients:  ?EntrepreneurPulse.com.au ? ?Fact Sheet for Healthcare Providers:  ?IncredibleEmployment.be ? ?This test is no t yet approved or cleared by the [place] FDA and  ?has been authorized for detection and/or diagnosis of SARS-CoV-2 by ?FDA under an Emergency Use Authorization (EUA). This EUA will remain  ?in effect (meaning this test can be used) for the duration of the ?COVID-19 declaration under Section 564(b)(1) of the Act, 21 ?U.S.C.section  360bbb-3(b)(1), unless the authorization is terminated  ?or revoked sooner.  ? ? ?  ? Influenza A by PCR NEGATIVE NEGATIVE Final  ? Influenza B by PCR NEGATIVE NEGATIVE Final  ?  Comment: (NOTE) ?The Xpert

## 2021-05-30 NOTE — H&P (Addendum)
?History and Physical  ? ? ?Patient: [name] QQV:[id number] DOB: 02-22-1986 ?DOA: 05/29/2021 ?DOS: the patient was seen and examined on 05/30/2021 ?PCP: [name], MD  ?Patient coming from: Home ? ?Chief Complaint:  ?Chief Complaint  ?Patient presents with  ? Headache  ? ?HPI: LARKYN GREENBERGER is a 36 y.o. female with medical history significant of DM, pseudoseizures, HTN, ESRD. ? ?Pt presents to ED with c/o abd pain, N/V, and headache following dialysis yesterday. ? ?Symptoms constant, persistent, severe. ? ?Recent gastric emptying study was normal (negative for gastroparesis) in Feb. ? ?Has h/o esophageal strictures, she says that abd pain she is currently having is different than her esophageal stricture symptoms, but the abd pain is the same as prior abd pain episodes that she gets frequently. ? ?She believes that they took off too much fluid at dialysis. ? ?Review of Systems: As mentioned in the history of present illness. All other systems reviewed and are negative. ?Past Medical History:  ?Diagnosis Date  ? Anemia   ? Blood transfusion without reported diagnosis   ? Chronic kidney disease   ? kidney transplant 07  ? Diabetes mellitus   ? Pt reports diagnosis in June 2011, Type 2  ? Diabetes mellitus without complication ([place])   ? GERD (gastroesophageal reflux disease)   ? Hyperlipidemia   ? Hypertension   ? Kidney transplant recipient 2007  ? solitary kidney  ? LEARNING DISABILITY 09/25/2007  ? Qualifier: Diagnosis of  By: Deborra Medina MD, [place]    ? Pseudoseizures ([place]) 12/22/2012  ? Pyelonephritis 06/23/2014  ? Renal disorder   ? Seasonal allergies   ? Seizures ([place])   ? UTI (urinary tract infection) 01/09/2015  ? 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  ? Qualifier: Diagnosis of  By: Carlena Sax  MD, [place]    ? ?Past Surgical History:  ?Procedure Laterality Date  ? ARTERIOVENOUS GRAFT PLACEMENT Bilateral   ? "neither work" (10/24/2017)  ? AV FISTULA PLACEMENT Left 10/26/2018  ? Procedure: CREATION OF ARTERIOVENOUS FISTULA  LEFT  ARM;  Surgeon: [name], MD;  Location: [facility];  Service: Vascular;  Laterality: Left;  ? AV FISTULA PLACEMENT Left 05/23/2020  ? Procedure: LEFT ARM ARTERIOVENOUS (AV) FISTULA CREATION;  Surgeon: [name], MD;  Location: [facility];  Service: Vascular;  Laterality: Left;  ? BALLOON DILATION N/A 01/19/2021  ? Procedure: BALLOON DILATION;  Surgeon: Gatha Mayer, MD;  Location: [facility];  Service: Endoscopy;  Laterality: N/A;  ? BALLOON DILATION N/A 02/10/2021  ? Procedure: BALLOON DILATION;  Surgeon: Irene Shipper, MD;  Location: [facility];  Service: Endoscopy;  Laterality: N/A;  ? BALLOON DILATION N/A 02/19/2021  ? Procedure: BALLOON DILATION;  Surgeon: Sharyn Creamer, MD;  Location: [facility];  Service: Gastroenterology;  Laterality: N/A;  ? BASCILIC VEIN TRANSPOSITION Left 12/21/2018  ? Procedure: Left arm BASILIC VEIN TRANSPOSITION SECOND STAGE;  Surgeon: [name], MD;  Location: [facility];  Service: Vascular;  Laterality: Left;  ? CHOLECYSTECTOMY N/A 06/30/2017  ? Procedure: LAPAROSCOPIC CHOLECYSTECTOMY WITH INTRAOPERATIVE CHOLANGIOGRAM;  Surgeon: Excell Seltzer, MD;  Location: [facility];  Service: General;  Laterality: N/A;  ? ESOPHAGOGASTRODUODENOSCOPY (EGD) WITH PROPOFOL N/A 07/04/2017  ? Procedure: ESOPHAGOGASTRODUODENOSCOPY (EGD) WITH PROPOFOL;  Surgeon: Clarene Essex, MD;  Location: [facility];  Service: Endoscopy;  Laterality: N/A;  ? ESOPHAGOGASTRODUODENOSCOPY (EGD) WITH PROPOFOL N/A 08/10/2020  ? Procedure: ESOPHAGOGASTRODUODENOSCOPY (EGD) WITH PROPOFOL;  Surgeon: Doran Stabler, MD;  Location: [facility];  Service: Gastroenterology;  Laterality: N/A;  ?  ESOPHAGOGASTRODUODENOSCOPY (EGD) WITH PROPOFOL N/A 01/19/2021  ? Procedure: ESOPHAGOGASTRODUODENOSCOPY (EGD) WITH PROPOFOL;  Surgeon: Gatha Mayer, MD;  Location: [facility];  Service: Endoscopy;  Laterality: N/A;  WITH FLUOROSCOPY AND DILATION  ? ESOPHAGOGASTRODUODENOSCOPY (EGD) WITH PROPOFOL N/A 02/03/2021  ?  Procedure: ESOPHAGOGASTRODUODENOSCOPY (EGD) WITH PROPOFOL;  Surgeon: Gatha Mayer, MD;  Location: [facility];  Service: Endoscopy;  Laterality: N/A;  ? ESOPHAGOGASTRODUODENOSCOPY (EGD) WITH PROPOFOL N/A 02/10/2021  ? Procedure: ESOPHAGOGASTRODUODENOSCOPY (EGD) WITH PROPOFOL;  Surgeon: Irene Shipper, MD;  Location: [facility];  Service: Endoscopy;  Laterality: N/A;  Balloon Dilation  ? ESOPHAGOGASTRODUODENOSCOPY (EGD) WITH PROPOFOL N/A 02/19/2021  ? Procedure: ESOPHAGOGASTRODUODENOSCOPY (EGD) WITH PROPOFOL;  Surgeon: Sharyn Creamer, MD;  Location: [facility];  Service: Gastroenterology;  Laterality: N/A;  ? ESOPHAGOGASTRODUODENOSCOPY (EGD) WITH PROPOFOL N/A 03/12/2021  ? Procedure: ESOPHAGOGASTRODUODENOSCOPY (EGD) WITH PROPOFOL;  Surgeon: Lavena Bullion, DO;  Location: [facility];  Service: Gastroenterology;  Laterality: N/A;  ? FOREIGN BODY REMOVAL N/A 02/03/2021  ? Procedure: FOREIGN BODY REMOVAL;  Surgeon: Gatha Mayer, MD;  Location: [facility];  Service: Endoscopy;  Laterality: N/A;  ? INSERTION OF DIALYSIS CATHETER N/A 03/20/2018  ? Procedure: INSERTION OF TUNNELED DIALYSIS CATHETER - RIGHT INTERANL JUGULAR PLACEMENT;  Surgeon: Angelia Mould, MD;  Location: [facility];  Service: Vascular;  Laterality: N/A;  ? IR FLUORO GUIDE CV LINE RIGHT  04/18/2020  ? IR GUIDED DRAIN W CATHETER PLACEMENT  10/28/2018  ? KIDNEY TRANSPLANT  2007  ? KIDNEY TRANSPLANT Right   ? PARATHYROIDECTOMY  ?2012  ? "3/4 removed" (10/24/2017)  ? RENAL BIOPSY Bilateral 2003  ? REVISON OF ARTERIOVENOUS FISTULA Left 09/24/2020  ? Procedure: LEFT UPPER ARM ARTERIOVENOUS GRAFT CREATION;  Surgeon: [name], MD;  Location: [facility];  Service: Vascular;  Laterality: Left;  ? UPPER EXTREMITY VENOGRAPHY Bilateral 10/19/2018  ? Procedure: UPPER EXTREMITY VENOGRAPHY;  Surgeon: [name], MD;  Location: [facility];  Service: Cardiovascular;  Laterality: Bilateral;  Bilateral ?  ? UPPER EXTREMITY VENOGRAPHY Left  09/04/2020  ? Procedure: UPPER EXTREMITY VENOGRAPHY - Left Upper;  Surgeon: [name], MD;  Location: [facility];  Service: Cardiovascular;  Laterality: Left;  ? ?Social History:  reports that she has never smoked. She has never been exposed to tobacco smoke. She has never used smokeless tobacco. She reports that she does not currently use alcohol. She reports that she does not use drugs. ? ?Allergies  ?Allergen Reactions  ? Diphenhydramine Anaphylaxis  ? Motrin [Ibuprofen] Shortness Of Breath and Itching  ? Contrast Media [Iodinated Contrast Media] Itching  ? Shellfish Allergy Hives  ? Banana Itching, Nausea And Vomiting and Other (See Comments)  ?  Sick on the stomach  ? Chlorhexidine Itching  ? Ferrous Sulfate Itching  ? Iron Dextran Itching and Other (See Comments)  ?  Vein irritation ?  ? ? ?Family History  ?Problem Relation Age of Onset  ? Arthritis Mother   ? Hypertension Mother   ? Aneurysm Mother   ?     died of brain aneurysm  ? CAD Father   ?     Has 3 stents  ? Diabetes Father   ?     borderline  ? Early death Brother   ?     Died in war  ? Colon cancer Neg Hx   ? Esophageal cancer Neg Hx   ? Rectal cancer Neg Hx   ? Stomach cancer Neg Hx   ? ? ?  Prior to Admission medications   ?Medication Sig Start Date End Date Taking? Authorizing Provider  ?ACCU-CHEK GUIDE test strip USE TO TEST THREE TIMES DAILY FOR GLUCOSE TESTING 12/30/20   [provider]  ?ACCU-CHEK SOFTCLIX LANCETS lancets Use to check blood sugar 4 times per day. 12/29/15   Renato Shin, MD  ?acetaminophen (TYLENOL) 500 MG tablet Take 500-1,000 mg by mouth every 6 (six) hours as needed for moderate pain.    [provider]  ?BD PEN NEEDLE NANO 2ND GEN 32G X 4 MM MISC 4 (four) times daily. as directed 08/13/20   [provider]  ?calcium acetate (PHOSLO) 667 MG capsule Take 1,334 mg by mouth 3 (three) times daily with meals. Take 1-2 capsules (340-3524 mg) by mouth with snacks & take 2 capsules (1334  mg) by mouth with each meal 10/26/20   [provider]  ?Calcium Carbonate Antacid (CALCIUM CARBONATE, DOSED IN MG ELEMENTAL CALCIUM,) 1250 MG/5ML SUSP Take 500 mg of elemental calcium by mouth d

## 2021-05-30 NOTE — Assessment & Plan Note (Addendum)
Given that she gets this frequently, I doubt anything like aortic dissection is at play today. ?abd pain is different than her dysphagia from esophageal stricture (which she also has / gets) ?CT AP is negative for acute pathology. ?1. NPO for now ?2. Got nausea meds ?3. Morphine low dose PRN ?4. Gastric emptying study last month was normal (negative for gastroparesis) ?5. Really dont want to give IVF at the moment with that uncontrolled HTN. ?1. ED doc earlier this evening gave a full 1L bolus ?6. Repeat BMP this AM ?

## 2021-05-30 NOTE — ED Notes (Signed)
Pt continues vomiting states she is unable to ambulate at this time. Pt very sleepy on her room. Dr. Ralene Bathe notified. ?

## 2021-05-30 NOTE — ED Provider Notes (Signed)
Patient care assumed at 0000.  Pt with ESRD on HD here for evaluation of HA that started after dialysis.  Care assumed pending CTAP.  CT with possible ground glass opacities.  On assessment at bed side pt complains of ongoing HA, has photophobia, tachcyardia, HTN.  She is drowsy but conversant, MAE symmetrically.  Will continue to treat HA, HTN and recheck.   ? ?Patient with persistent hypertension, tachycardia and headache on examination.  She was treated with additional labetalol.  Patient unable to ambulate, had an episode of emesis.  Recommend admission for ongoing treatment.  Hospitalist consulted for admission. ?  ?Quintella Reichert, MD ?05/30/21 0845 ? ?

## 2021-05-31 LAB — CBC
HCT: 40.5 % (ref 36.0–46.0)
Hemoglobin: 12.5 g/dL (ref 12.0–15.0)
MCH: 28.3 pg (ref 26.0–34.0)
MCHC: 30.9 g/dL (ref 30.0–36.0)
MCV: 91.8 fL (ref 80.0–100.0)
Platelets: 195 10*3/uL (ref 150–400)
RBC: 4.41 MIL/uL (ref 3.87–5.11)
RDW: 19.4 % — ABNORMAL HIGH (ref 11.5–15.5)
WBC: 12.6 10*3/uL — ABNORMAL HIGH (ref 4.0–10.5)
nRBC: 0 % (ref 0.0–0.2)

## 2021-05-31 LAB — BASIC METABOLIC PANEL
Anion gap: 15 (ref 5–15)
BUN: 48 mg/dL — ABNORMAL HIGH (ref 6–20)
CO2: 28 mmol/L (ref 22–32)
Calcium: 8.1 mg/dL — ABNORMAL LOW (ref 8.9–10.3)
Chloride: 94 mmol/L — ABNORMAL LOW (ref 98–111)
Creatinine, Ser: 8.75 mg/dL — ABNORMAL HIGH (ref 0.44–1.00)
GFR, Estimated: 6 mL/min — ABNORMAL LOW (ref >60)
Glucose, Bld: 159 mg/dL — ABNORMAL HIGH (ref 70–99)
Potassium: 6.4 mmol/L (ref 3.5–5.1)
Sodium: 137 mmol/L (ref 135–145)

## 2021-05-31 LAB — GLUCOSE, CAPILLARY
Glucose-Capillary: 141 mg/dL — ABNORMAL HIGH (ref 70–99)
Glucose-Capillary: 142 mg/dL — ABNORMAL HIGH (ref 70–99)

## 2021-05-31 LAB — PHOSPHORUS: Phosphorus: 6.8 mg/dL — ABNORMAL HIGH (ref 2.5–4.6)

## 2021-05-31 LAB — POTASSIUM: Potassium: 5.6 mmol/L — ABNORMAL HIGH (ref 3.5–5.1)

## 2021-05-31 LAB — HIV ANTIBODY (ROUTINE TESTING W REFLEX): HIV Screen 4th Generation wRfx: NONREACTIVE

## 2021-05-31 LAB — MAGNESIUM: Magnesium: 2.6 mg/dL — ABNORMAL HIGH (ref 1.7–2.4)

## 2021-05-31 MED ORDER — SODIUM ZIRCONIUM CYCLOSILICATE 10 G PO PACK
10.0000 g | PACK | Freq: Once | ORAL | Status: AC
  Administered 2021-05-31: 10 g via ORAL
  Filled 2021-05-31: qty 1

## 2021-05-31 MED ORDER — HYDROMORPHONE HCL 1 MG/ML IJ SOLN
0.5000 mg | INTRAMUSCULAR | Status: DC | PRN
  Administered 2021-05-31: 0.5 mg via INTRAVENOUS
  Filled 2021-05-31: qty 0.5

## 2021-05-31 MED ORDER — DEXTROSE 50 % IV SOLN
25.0000 g | Freq: Once | INTRAVENOUS | Status: AC
  Administered 2021-05-31: 25 g via INTRAVENOUS
  Filled 2021-05-31: qty 50

## 2021-05-31 MED ORDER — CALCIUM GLUCONATE-NACL 1-0.675 GM/50ML-% IV SOLN
1.0000 g | Freq: Once | INTRAVENOUS | Status: AC
  Administered 2021-05-31: 1000 mg via INTRAVENOUS
  Filled 2021-05-31: qty 50

## 2021-05-31 MED ORDER — INSULIN ASPART 100 UNIT/ML IJ SOLN
10.0000 [IU] | Freq: Once | INTRAMUSCULAR | Status: AC
  Administered 2021-05-31: 10 [IU] via INTRAVENOUS

## 2021-05-31 NOTE — Discharge Summary (Signed)
Physician Discharge Summary  ?ARAEYA LAMB UXN:[id number] DOB: 29-Oct-1985 DOA: 05/29/2021 ? ?PCP: [name], MD ? ?Admit date: 05/29/2021 ?Discharge date: 05/31/2021 ? ?Admitted From: Home ?Disposition: Home ? ?Recommendations for Outpatient Follow-up:  ?Follow up with PCP in 1-2 weeks ?Please obtain BMP/CBC in one week ?Dialysis is scheduled ? ?Home Health: None ?Equipment/Devices: None ?Discharge Condition: Stable ?CODE STATUS: Full code ?Diet recommendation: Renal diet ? ?Brief/Interim Summary: ?HPI: [name] is a 36 y.o. female with medical history significant of DM, pseudoseizures, HTN, ESRD.Pt presents to ED with c/o abd pain, N/V, and headache following dialysis yesterday.Recent gastric emptying study was normal (negative for gastroparesis) in Feb. ?Has h/o esophageal strictures, she says that abd pain she is currently having is different than her esophageal stricture symptoms, but the abd pain is the same as prior abd pain episodes that she gets frequently.She believes that they took off too much fluid at dialysis. ?  ?Upon arrival to ED: Patient's blood pressure 220/133, heart rate 110s.  Chest x-ray shows suspected mild interstitial edema.  Right lower lobe predominant less likely multifocal pneumonia.CT abdomen shows bilateral lobe patchy groundglass airspace opacities.  Similar appearing right pericolic gutter and right inguinal region surgical changes.  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  Patient was given IV Compazine, metoprolol 25 mg, IV labetalol 10 mg, IV fluid bolus in the ED.  [facility] hospitalist consulted for further evaluation and management. ? ?Discharge Diagnoses:  ? ?Hypertension urgency: ?-Patient presented with severe headache, nausea, vomiting.  Blood pressure noted to be 220/133 upon arrival to the hospital. ?-Labetalol 10 mg once given in the ED. ?-Continued labetalol 10 to 20 mg IV every 2 hours as needed ?-His blood pressure at the time of discharge 116/80. ? ?Sinus  tachycardia:  ?-Upon arrival: Heart rate between 110-120s. ?-Improved with labetalol.  Heart rate upon discharge 98. ?  ?Intractable abdominal pain: ?-Lipase: Slightly elevated at 63. ?-CT abdomen/pelvis: Negative for any acute findings.  Gastric emptying studies negative recently. ?-Phenergan as needed for nausea. ?-As needed Dilaudid for pain control ?-Pain improved.  Diet advanced to clear liquid to renal diet and she was able to tolerated without any issues. ?  ?End-stage renal disease on hemodialysis: ?-She gets hemodialysis on MWF ?-Consulted nephrology ?  ?Hyperkalemia: ?-On admission potassium 5.6.  Lokelma once given. ?-Discussed with nephrology-no need of urgent dialysis at this time  ?-Tall T waves noted on EKG-regular insulin 8 units given.   ?-3/19: Potassium 6.4.  Nephrology was notified and patient was given insulin with dextrose, Lokelma.  Repeat potassium: 5.6.  Underwent dialysis on 3/19 before discharge.   ?-Okay to discharge home from nephrology standpoint. ? ?Prolonged QT interval:  ?-Noted on EKG. ?-Avoid medications that prolong QT interval. ?-Replenished electrolytes.  Monitor closely on telemetry ?  ?Type 2 diabetes mellitus with hyperglycemia:  ?-Uncontrolled.  A1c 8.0%  ?-On admission patient was noted to have anion gap metabolic acidosis: Anion gap 18.  Bicarb: 25.  Beta hydroxybutyric acid: WNL ?-Started on sliding scale insulin. ?-3/19: Gap closed.  Bicarb: 28. ? ?Chronic left foot wound: ?-Followed by wound care outpatient once in a week for dressing change ? ? ?Discharge Instructions ? ?Discharge Instructions   ? ? Diet - low sodium heart healthy   Complete by: As directed ?  ? No wound care   Complete by: As directed ?  ? ?  ? ?Allergies as of 05/31/2021   ? ?   Reactions  ? Diphenhydramine Anaphylaxis  ?  Motrin [ibuprofen] Shortness Of Breath, Itching  ? Contrast Media [iodinated Contrast Media] Itching  ? Shellfish Allergy Hives  ? Banana Itching, Nausea And Vomiting, Other (See  Comments)  ? Sick on the stomach  ? Chlorhexidine Itching  ? Ferrous Sulfate Itching  ? Iron Dextran Itching, Other (See Comments)  ? Vein irritation  ? ?  ? ?  ?Medication List  ?  ? ?STOP taking these medications   ? ?oxyCODONE 5 MG immediate release tablet ?Commonly known as: Roxicodone ?  ? ?  ? ?TAKE these medications   ? ?Accu-Chek Softclix Lancets lancets ?Use to check blood sugar 4 times per day. ?  ?acetaminophen 500 MG tablet ?Commonly known as: TYLENOL ?Take 500-1,000 mg by mouth every 6 (six) hours as needed for moderate pain. ?  ?BD Pen Needle Nano 2nd Gen 32G X 4 MM Misc ?Generic drug: Insulin Pen Needle ?1 each by Does not apply route in the morning, at noon, in the evening, and at bedtime. ?  ?calcium acetate 667 MG capsule ?Commonly known as: PHOSLO ?Take 1,334 mg by mouth 3 (three) times daily with meals. Take 1-2 capsules ([phone] mg) by mouth with snacks & take 2 capsules (1334 mg) by mouth with each meal ?  ?calcium carbonate (dosed in mg elemental calcium) 1250 MG/5ML Susp ?Take 500 mg of elemental calcium by mouth daily. ?  ?cetirizine 10 MG tablet ?Commonly known as: ZYRTEC ?Take 10 mg by mouth daily. ?  ?famotidine 40 MG tablet ?Commonly known as: PEPCID ?TAKE 1 TABLET BY MOUTH TWICE DAILY IN THE MORNING AND AT BEDTIME ?  ?fluticasone 50 MCG/ACT nasal spray ?Commonly known as: FLONASE ?Place 2 sprays into both nostrils daily as needed for allergies. ?  ?gabapentin 400 MG capsule ?Commonly known as: NEURONTIN ?Take 800-1,200 mg by mouth at bedtime. ?  ?hydrOXYzine 50 MG tablet ?Commonly known as: ATARAX ?Take 50 mg by mouth 2 (two) times daily as needed for anxiety. ?  ?insulin degludec 100 UNIT/ML FlexTouch Pen ?Commonly known as: TRESIBA ?Inject 20 Units into the skin daily. ?  ?lidocaine 2 % solution ?Commonly known as: XYLOCAINE ?Use as directed 15 mLs in the mouth or throat every 6 (six) hours as needed for mouth pain. ?  ?lidocaine-prilocaine cream ?Commonly known as: EMLA ?Apply 1  application topically daily as needed [facility]). ?  ?loperamide 2 MG capsule ?Commonly known as: IMODIUM ?Take 2 mg by mouth as needed for diarrhea or loose stools. ?  ?metoCLOPramide 10 MG tablet ?Commonly known as: Reglan ?Take 1 tablet (10 mg total) by mouth 4 (four) times daily -  before meals and at bedtime. ?  ?metoprolol tartrate 25 MG tablet ?Commonly known as: LOPRESSOR ?Take 1 tablet (25 mg total) by mouth 2 (two) times daily. ?What changed: when to take this ?  ?NovoLOG FlexPen 100 UNIT/ML FlexPen ?Generic drug: insulin aspart ?Inject 0-15 Units into the skin in the morning, at noon, in the evening, and at bedtime. Sliding Scale insulin ?  ?omeprazole 40 MG capsule ?Commonly known as: PRILOSEC ?Take 1 capsule (40 mg total) by mouth 2 (two) times daily before a meal. Open capsule and place granules in water or applesauce to swallow ?  ?ondansetron 4 MG disintegrating tablet ?Commonly known as: ZOFRAN-ODT ?Take 4 mg by mouth every 8 (eight) hours as needed for vomiting or nausea. ?  ?oxyCODONE-acetaminophen 5-325 MG tablet ?Commonly known as: PERCOCET/ROXICET ?Take 0.25 tablets by mouth 2 (two) times daily as needed (leg pain). ?  ?silver  sulfADIAZINE 1 % cream ?Commonly known as: SILVADENE ?Apply 1 application topically daily. ?  ?simvastatin 20 MG tablet ?Commonly known as: ZOCOR ?Take 20 mg by mouth at bedtime. ?  ? ?  ? ? ?Allergies  ?Allergen Reactions  ? Diphenhydramine Anaphylaxis  ? Motrin [Ibuprofen] Shortness Of Breath and Itching  ? Contrast Media [Iodinated Contrast Media] Itching  ? Shellfish Allergy Hives  ? Banana Itching, Nausea And Vomiting and Other (See Comments)  ?  Sick on the stomach  ? Chlorhexidine Itching  ? Ferrous Sulfate Itching  ? Iron Dextran Itching and Other (See Comments)  ?  Vein irritation ?  ? ? ?Consultations: ?Nephrology ? ? ?Procedures/Studies: ?CT ABDOMEN PELVIS WO CONTRAST ? ?Result Date: 05/30/2021 ?CLINICAL DATA:  Abdominal pain, acute, nonlocalized EXAM: CT ABDOMEN  AND PELVIS WITHOUT CONTRAST TECHNIQUE: Multidetector CT imaging of the abdomen and pelvis was performed following the standard protocol without IV contrast. RADIATION DOSE REDUCTION: This exam was performed acco

## 2021-05-31 NOTE — Progress Notes (Signed)
Nephrology Follow-Up Consult note ? ? ?Assessment/Recommendations: [name] is a/an 36 y.o. female with a past medical history significant for ESRD MWF, admitted for abdominal pain.    ? ?Dialysis Orders:  ?MWF - [facility] ?4hrs, BFR 350, DFR 500,  EDW 77kg, 2K/ 3.5Ca, Profile 4 ?Heparin 4000 unit bolus with HD/2500 unit bolus mid-treatment ?Mircera 200 mcg q2wks - last 05/22/21 ?Hectorol 65mcg IV qHD - last 05/29/21 ?Venofer 100mg  IV X 10 doses-1st dose on 05/29/21  ?Renvela 2.5gm 1 packet with meals ?Calcium Carbonate 1250mg /62ml daily ? ?Intractable abdominal pain-CT ABD/pelvis (-); recent gastric emptying study (-); feels better today and wants to leave. Okay from nephrology standpoint after HD. Advanced diet to clears. ?ESRD - on HD MWF; received full treatment Fri and reach EDW. No need for HD today. Given hyperkalemia will do HD today. She should go tomorrow outpatient per her schedule. ?Hypertension/volume  - appears euvolemic on exam; Bps high in er now improved. No UF today ?Anemia of CKD - none at this time ?Secondary Hyperparathyroidism - Ca okay, phos 6.8. CTM outpatient ?Nutrition - advance diet as tolerated ? ? ?Recommendations conveyed to primary service.  ? ? ?Reesa Chew ?[facility] ?05/31/2021 ?9:57 AM ? ?___________________________________________________________ ? ?CC: ESRD abdominal pain ? ?Interval History/Subjective: Patient states her nausea and abdominal pain are much better this morning.  Some lower blood pressures today.  Patient really wants to go home.  Discussed that her potassium is high but after dialysis she would be fine to leave per nephrology ? ? ?Medications:  ?Current Facility-Administered Medications  ?Medication Dose Route Frequency Provider Last Rate Last Admin  ? acetaminophen (TYLENOL) tablet 650 mg  650 mg Oral Q6H PRN Etta Quill, DO      ? Or  ? acetaminophen (TYLENOL) suppository 650 mg  650 mg Rectal Q6H PRN Etta Quill, DO      ? heparin injection 5,000 Units  5,000 Units Subcutaneous Q8H Etta Quill, DO   5,000 Units at 05/31/21 9323  ? HYDROmorphone (DILAUDID) injection 0.5 mg  0.5 mg Intravenous Q4H PRN Shela Leff, MD   0.5 mg at 05/31/21 0303  ? insulin aspart (novoLOG) injection 0-9 Units  0-9 Units Subcutaneous Q4H Etta Quill, DO   1 Units at 05/31/21 0831  ? insulin glargine-yfgn [facility]) injection 7 Units  7 Units Subcutaneous Daily Etta Quill, DO   7 Units at 05/31/21 5573  ? labetalol (NORMODYNE) injection 10-20 mg  10-20 mg Intravenous Q2H PRN Etta Quill, DO   15 mg at 05/30/21 1036  ? ondansetron (ZOFRAN) tablet 4 mg  4 mg Oral Q6H PRN Etta Quill, DO      ? Or  ? ondansetron (ZOFRAN) injection 4 mg  4 mg Intravenous Q6H PRN Etta Quill, DO      ? promethazine (PHENERGAN) 12.5 mg in sodium chloride 0.9 % 50 mL IVPB  12.5 mg Intravenous Q6H PRN Pahwani, Rinka R, MD 200 mL/hr at 05/30/21 1409 12.5 mg at 05/30/21 1409  ?  ? ? ?Review of Systems: ?10 systems reviewed and negative except per interval history/subjective ? ?Physical Exam: ?Vitals:  ? 05/31/21 0800 05/31/21 0930  ?BP:    ?Pulse:    ?Resp:    ?Temp:    ?SpO2: 100% 97%  ? ?No intake/output data recorded. ?No intake or output data in the 24 hours ending 05/31/21 0957 ?Constitutional: well-appearing, no acute distress ?ENMT: ears and nose without  scars or lesions, MMM ?CV: normal rate, no edema ?Respiratory: Bilateral chest rise, normal work of breathing ?Gastrointestinal: soft, non-tender, no palpable masses or hernias ?Skin: no visible lesions or rashes ?Psych: alert, judgement/insight appropriate, appropriate mood and affect ? ? ?Test Results ?I personally reviewed new and old clinical labs and radiology tests ?Lab Results  ?Component Value Date  ? NA 137 05/31/2021  ? K 5.6 (H) 05/31/2021  ? CL 94 (L) 05/31/2021  ? CO2 28 05/31/2021  ? BUN 48 (H) 05/31/2021  ? CREATININE 8.75 (H) 05/31/2021  ? CALCIUM 8.1 (L)  05/31/2021  ? ALBUMIN 4.5 05/29/2021  ? PHOS 6.8 (H) 05/31/2021  ? ? ?CBC ?Recent Labs  ?Lab 05/29/21 ?1948 05/29/21 ?1957 05/31/21 ?0330  ?WBC 6.9  --  12.6*  ?NEUTROABS 5.1  --   --   ?HGB 14.8 16.7* 12.5  ?HCT 47.2* 49.0* 40.5  ?MCV 90.8  --  91.8  ?PLT 192  --  195  ? ? ? ? ? ?

## 2021-05-31 NOTE — Progress Notes (Signed)
Overnight progress note ? ?Patient with end-stage renal disease on dialysis.  Potassium increased to 6.4 on morning labs. ? ?-EKG done on admission was showing peaked T waves.  Will give calcium gluconate and order stat repeat EKG. ?-Insulin/dextrose ?-Lokelma ?-Repeat potassium level after treatment ?-I have notified nephrology ? ?

## 2021-06-01 ENCOUNTER — Telehealth: Payer: Self-pay | Admitting: Nurse Practitioner

## 2021-06-01 ENCOUNTER — Emergency Department (HOSPITAL_BASED_OUTPATIENT_CLINIC_OR_DEPARTMENT_OTHER)
Admission: EM | Admit: 2021-06-01 | Discharge: 2021-06-01 | Disposition: A | Discharge status: Home / Self Care | Payer: Medicare Other | Attending: Emergency Medicine | Admitting: Emergency Medicine

## 2021-06-01 ENCOUNTER — Emergency Department (HOSPITAL_BASED_OUTPATIENT_CLINIC_OR_DEPARTMENT_OTHER): Payer: Medicare Other | Admitting: Radiology

## 2021-06-01 ENCOUNTER — Other Ambulatory Visit: Payer: Self-pay

## 2021-06-01 ENCOUNTER — Emergency Department (HOSPITAL_BASED_OUTPATIENT_CLINIC_OR_DEPARTMENT_OTHER): Payer: Medicare Other

## 2021-06-01 ENCOUNTER — Encounter (HOSPITAL_BASED_OUTPATIENT_CLINIC_OR_DEPARTMENT_OTHER): Payer: Self-pay | Admitting: Obstetrics and Gynecology

## 2021-06-01 DIAGNOSIS — R109 Unspecified abdominal pain: Secondary | ICD-10-CM | POA: Diagnosis not present

## 2021-06-01 DIAGNOSIS — Z79899 Other long term (current) drug therapy: Secondary | ICD-10-CM | POA: Insufficient documentation

## 2021-06-01 DIAGNOSIS — N186 End stage renal disease: Secondary | ICD-10-CM | POA: Diagnosis not present

## 2021-06-01 DIAGNOSIS — N9489 Other specified conditions associated with female genital organs and menstrual cycle: Secondary | ICD-10-CM | POA: Insufficient documentation

## 2021-06-01 DIAGNOSIS — R519 Headache, unspecified: Secondary | ICD-10-CM | POA: Diagnosis present

## 2021-06-01 DIAGNOSIS — E1122 Type 2 diabetes mellitus with diabetic chronic kidney disease: Secondary | ICD-10-CM | POA: Diagnosis not present

## 2021-06-01 DIAGNOSIS — I10 Essential (primary) hypertension: Secondary | ICD-10-CM

## 2021-06-01 DIAGNOSIS — Z992 Dependence on renal dialysis: Secondary | ICD-10-CM | POA: Insufficient documentation

## 2021-06-01 DIAGNOSIS — Z794 Long term (current) use of insulin: Secondary | ICD-10-CM | POA: Diagnosis not present

## 2021-06-01 DIAGNOSIS — I120 Hypertensive chronic kidney disease with stage 5 chronic kidney disease or end stage renal disease: Secondary | ICD-10-CM | POA: Insufficient documentation

## 2021-06-01 LAB — COMPREHENSIVE METABOLIC PANEL
ALT: 7 U/L (ref 0–44)
AST: 34 U/L (ref 15–41)
Albumin: 4.4 g/dL (ref 3.5–5.0)
Alkaline Phosphatase: 65 U/L (ref 38–126)
Anion gap: 15 (ref 5–15)
BUN: 25 mg/dL — ABNORMAL HIGH (ref 6–20)
CO2: 23 mmol/L (ref 22–32)
Calcium: 10.1 mg/dL (ref 8.9–10.3)
Chloride: 95 mmol/L — ABNORMAL LOW (ref 98–111)
Creatinine, Ser: 4.67 mg/dL — ABNORMAL HIGH (ref 0.44–1.00)
GFR, Estimated: 12 mL/min — ABNORMAL LOW (ref >60)
Glucose, Bld: 168 mg/dL — ABNORMAL HIGH (ref 70–99)
Potassium: 5.8 mmol/L — ABNORMAL HIGH (ref 3.5–5.1)
Sodium: 133 mmol/L — ABNORMAL LOW (ref 135–145)
Total Bilirubin: 0.7 mg/dL (ref 0.3–1.2)
Total Protein: 9.1 g/dL — ABNORMAL HIGH (ref 6.5–8.1)

## 2021-06-01 LAB — CBC
HCT: 41.5 % (ref 36.0–46.0)
Hemoglobin: 12.6 g/dL (ref 12.0–15.0)
MCH: 27.8 pg (ref 26.0–34.0)
MCHC: 30.4 g/dL (ref 30.0–36.0)
MCV: 91.4 fL (ref 80.0–100.0)
Platelets: 170 10*3/uL (ref 150–400)
RBC: 4.54 MIL/uL (ref 3.87–5.11)
RDW: 18.6 % — ABNORMAL HIGH (ref 11.5–15.5)
WBC: 6.1 10*3/uL (ref 4.0–10.5)
nRBC: 0 % (ref 0.0–0.2)

## 2021-06-01 LAB — HCG, SERUM, QUALITATIVE: Preg, Serum: NEGATIVE

## 2021-06-01 LAB — LIPASE, BLOOD: Lipase: 30 U/L (ref 11–51)

## 2021-06-01 MED ORDER — LABETALOL HCL 5 MG/ML IV SOLN
20.0000 mg | Freq: Once | INTRAVENOUS | Status: AC
  Administered 2021-06-01: 20 mg via INTRAVENOUS
  Filled 2021-06-01: qty 4

## 2021-06-01 MED ORDER — ONDANSETRON 8 MG PO TBDP: 8.0000 mg | ORAL_TABLET | Freq: Three times a day (TID) | ORAL | 0 refills | Status: DC | PRN

## 2021-06-01 MED ORDER — ONDANSETRON HCL 4 MG/2ML IJ SOLN
4.0000 mg | Freq: Once | INTRAMUSCULAR | Status: AC
Start: 2021-06-01 — End: 2021-06-01
  Administered 2021-06-01: 4 mg via INTRAVENOUS
  Filled 2021-06-01: qty 2

## 2021-06-01 MED ORDER — SODIUM ZIRCONIUM CYCLOSILICATE 5 G PO PACK
5.0000 g | PACK | Freq: Once | ORAL | Status: AC
Start: 2021-06-01 — End: 2021-06-01
  Administered 2021-06-01: 5 g via ORAL
  Filled 2021-06-01: qty 1

## 2021-06-01 MED ORDER — OXYCODONE-ACETAMINOPHEN 5-325 MG PO TABS: 1.0000 | ORAL_TABLET | Freq: Four times a day (QID) | ORAL | 0 refills | Status: DC | PRN

## 2021-06-01 MED ORDER — HYDROMORPHONE HCL 1 MG/ML IJ SOLN
1.0000 mg | Freq: Once | INTRAMUSCULAR | Status: AC
  Administered 2021-06-01: 1 mg via INTRAVENOUS
  Filled 2021-06-01: qty 1

## 2021-06-01 NOTE — ED Notes (Signed)
Patient placed on 2LNC at this time due to desaturations following pain medication administration. Pt's SpO2 currently 99%. ?

## 2021-06-01 NOTE — Discharge Instructions (Signed)
Take the medications as needed for pain and nausea.  Follow-up with your primary care and GI doctor for further evaluation.  Follow-up with your next dialysis session as planned ?

## 2021-06-01 NOTE — ED Provider Notes (Signed)
?[facility] EMERGENCY DEPT ?Provider Note ? ? ?CSN: [id number] ?Arrival date & time: 06/01/21  1735 ? ?  ? ?History ? ?Chief Complaint  ?Patient presents with  ? Abdominal Pain  ? Headache  ? ? ?[name] is a 36 y.o. female. ? ? ?Abdominal Pain ?Headache ?Associated symptoms: abdominal pain   ?Patient has a complex history including diabetes pseudoseizures hypertension end-stage renal disease on dialysis. ?Patient presents ED with complaints of severe headache and abdominal pain.  Patient states she started having the symptoms yesterday.  It has been gradually getting worse since yesterday and today became severe.  Patient states she is having a headache throughout her entire head.  She is also having abdominal pain throughout her abdomen.  She has felt feverish but has not measured a fever.  She has had some nausea and vomiting. ? ?Patient does have history of chronic kidney disease and hypertension.  She states she has been taking her medications.  Her blood pressure has been elevated.  Patient did go to dialysis today but think they took off too much fluid ? ?Patient was also admitted to the hospital recently on March 17.  She was discharged yesterday on the 19th.  Patient was noted to be hypertensive during her ED stay.  She ended up getting admitted to the hospital.  Patient was also noted to have intractable abdominal pain.  Patient's pain improved during her stay.  She was able to eat and drink prior to discharge.  Patient was dialyzed on March 19 prior to discharge. ? ?Home Medications ?Prior to Admission medications   ?Medication Sig Start Date End Date Taking? Authorizing Provider  ?ondansetron (ZOFRAN-ODT) 8 MG disintegrating tablet Take 1 tablet (8 mg total) by mouth every 8 (eight) hours as needed for nausea or vomiting. 06/01/21  Yes Dorie Rank, MD  ?oxyCODONE-acetaminophen (PERCOCET/ROXICET) 5-325 MG tablet Take 1 tablet by mouth every 6 (six) hours as needed for severe pain. 06/01/21   Yes Dorie Rank, MD  ?Danny Lawless [facility] LANCETS lancets Use to check blood sugar 4 times per day. 12/29/15   Renato Shin, MD  ?acetaminophen (TYLENOL) 500 MG tablet Take 500-1,000 mg by mouth every 6 (six) hours as needed for moderate pain.    [provider]  ?calcium acetate (PHOSLO) 667 MG capsule Take 1,334 mg by mouth 3 (three) times daily with meals. Take 1-2 capsules (510-2585 mg) by mouth with snacks & take 2 capsules (1334 mg) by mouth with each meal 10/26/20   [provider]  ?Calcium Carbonate Antacid (CALCIUM CARBONATE, DOSED IN MG ELEMENTAL CALCIUM,) 1250 MG/5ML SUSP Take 500 mg of elemental calcium by mouth daily. 10/22/20   [provider]  ?cetirizine (ZYRTEC) 10 MG tablet Take 10 mg by mouth daily.    [provider]  ?famotidine (PEPCID) 40 MG tablet TAKE 1 TABLET BY MOUTH TWICE DAILY IN THE MORNING AND AT BEDTIME 03/17/21   Gatha Mayer, MD  ?fluticasone [facility]) 50 MCG/ACT nasal spray Place 2 sprays into both nostrils daily as needed for allergies. 09/03/18   [name], MD  ?gabapentin (NEURONTIN) 400 MG capsule Take 800-1,200 mg by mouth at bedtime. 12/30/20   [provider]  ?hydrOXYzine (ATARAX/VISTARIL) 50 MG tablet Take 50 mg by mouth 2 (two) times daily as needed for anxiety. 11/03/20   [provider]  ?insulin aspart (NOVOLOG FLEXPEN) 100 UNIT/ML FlexPen Inject 0-15 Units into the skin in the morning, at noon, in the evening, and at bedtime. Sliding Scale  insulin    [provider]  ?insulin degludec (TRESIBA) 100 UNIT/ML FlexTouch Pen Inject 20 Units into the skin daily. ?Patient taking differently: Inject 20 Units into the skin daily. 02/15/20   Dwyane Dee, MD  ?Insulin Pen Needle (BD PEN NEEDLE NANO 2ND GEN) 32G X 4 MM MISC 1 each by Does not apply route in the morning, at noon, in the evening, and at bedtime. 08/10/20   Barb Merino, MD  ?lidocaine (XYLOCAINE) 2 % solution Use as directed 15 mLs in the mouth or  throat every 6 (six) hours as needed for mouth pain. 11/11/20   Fatima Blank, MD  ?lidocaine-prilocaine (EMLA) cream Apply 1 application topically daily as needed [facility]). 12/17/20   [provider]  ?loperamide (IMODIUM) 2 MG capsule Take 2 mg by mouth as needed for diarrhea or loose stools. 09/11/20   [provider]  ?metoCLOPramide (REGLAN) 10 MG tablet Take 1 tablet (10 mg total) by mouth 4 (four) times daily -  before meals and at bedtime. 03/17/21   Gatha Mayer, MD  ?metoprolol tartrate (LOPRESSOR) 25 MG tablet Take 1 tablet (25 mg total) by mouth 2 (two) times daily. ?Patient taking differently: Take 25 mg by mouth in the morning. 02/15/20   Dwyane Dee, MD  ?omeprazole (PRILOSEC) 40 MG capsule Take 1 capsule (40 mg total) by mouth 2 (two) times daily before a meal. Open capsule and place granules in water or applesauce to swallow 03/17/21   Gatha Mayer, MD  ?ondansetron (ZOFRAN-ODT) 4 MG disintegrating tablet Take 4 mg by mouth every 8 (eight) hours as needed for vomiting or nausea. 11/04/20   [provider]  ?oxyCODONE-acetaminophen (PERCOCET/ROXICET) 5-325 MG tablet Take 0.25 tablets by mouth 2 (two) times daily as needed (leg pain). 02/20/21   [provider]  ?silver sulfADIAZINE (SILVADENE) 1 % cream Apply 1 application topically daily. 02/06/21   Henderly, Britni A, PA-C  ?simvastatin (ZOCOR) 20 MG tablet Take 20 mg by mouth at bedtime.    [provider]  ?   ? ?Allergies    ?Diphenhydramine, Motrin [ibuprofen], Contrast media [iodinated contrast media], Shellfish allergy, Banana, Chlorhexidine, Ferrous sulfate, and Iron dextran   ? ?Review of Systems   ?Review of Systems  ?Gastrointestinal:  Positive for abdominal pain.  ?Neurological:  Positive for headaches.  ? ?Physical Exam ?Updated Vital Signs ?BP (!) 134/97   Pulse 87   Temp 98 ?F (36.7 ?C)   Resp 13   Ht 1.676 m (5\' 6" )   Wt 76.2 kg   SpO2 100%   BMI 27.11 kg/m?  ?Physical  Exam ?Vitals and nursing note reviewed.  ?Constitutional:   ?   General: She is in acute distress.  ?   Appearance: She is well-developed. She is ill-appearing.  ?HENT:  ?   Head: Normocephalic and atraumatic.  ?   Right Ear: External ear normal.  ?   Left Ear: External ear normal.  ?Eyes:  ?   General: No scleral icterus.    ?   Right eye: No discharge.     ?   Left eye: No discharge.  ?   Conjunctiva/sclera: Conjunctivae normal.  ?Neck:  ?   Trachea: No tracheal deviation.  ?Cardiovascular:  ?   Rate and Rhythm: Normal rate and regular rhythm.  ?Pulmonary:  ?   Effort: Pulmonary effort is normal. No respiratory distress.  ?   Breath sounds: Normal breath sounds. No stridor. No wheezing or rales.  ?Abdominal:  ?  General: Bowel sounds are normal. There is no distension.  ?   Palpations: Abdomen is soft.  ?   Tenderness: There is generalized abdominal tenderness. There is no guarding or rebound.  ?Musculoskeletal:     ?   General: No tenderness or deformity.  ?   Cervical back: Neck supple.  ?Skin: ?   General: Skin is warm and dry.  ?   Findings: No rash.  ?Neurological:  ?   General: No focal deficit present.  ?   Mental Status: She is alert.  ?   Cranial Nerves: No cranial nerve deficit (no facial droop, extraocular movements intact, no slurred speech).  ?   Sensory: No sensory deficit.  ?   Motor: No abnormal muscle tone or seizure activity.  ?   Coordination: Coordination normal.  ?Psychiatric:  ?   Comments: Tearful crying  ? ? ?ED Results / Procedures / Treatments   ?Labs ?(all labs ordered are listed, but only abnormal results are displayed) ?Labs Reviewed  ?COMPREHENSIVE METABOLIC PANEL - Abnormal; Notable for the following components:  ?    Result Value  ? Sodium 133 (*)   ? Potassium 5.8 (*)   ? Chloride 95 (*)   ? Glucose, Bld 168 (*)   ? BUN 25 (*)   ? Creatinine, Ser 4.67 (*)   ? Total Protein 9.1 (*)   ? GFR, Estimated 12 (*)   ? All other components within normal limits  ?CBC - Abnormal; Notable for  the following components:  ? RDW 18.6 (*)   ? All other components within normal limits  ?LIPASE, BLOOD  ?HCG, SERUM, QUALITATIVE  ? ? ?EKG ?EKG Interpretation ? ?Date/Time:  Monday June 01 2021 19:23:04 EDT ?Ve

## 2021-06-01 NOTE — ED Triage Notes (Signed)
Patient presents to the ER for abdominal pain and head pain. Patient reports it has been hurting since yesterday. Patient reports she had dialysis this morning and states they pulled too much fluid. Patient was recently discharged from the hospital on 3/17 ?

## 2021-06-01 NOTE — Telephone Encounter (Signed)
Transition of care contact from inpatient facility ? ?Date of discharge: 05/31/2021 ?Date of contact: 06/01/2021 ?Method: Phone ?Spoke to: Patient ? ?Patient contacted to discuss transition of care from recent inpatient hospitalization. Patient was admitted to [facility] from 03/17-03/19/2023 with discharge diagnosis of hypertensive urgency, hyperkalemia, Intractable abdominal pain. Patient is crying, says her abdomin still hurts, she is nauseated. Told to return to ED for evaluation.  ? ?Medication changes were reviewed. ? ?Patient will follow up with his/her outpatient HD unit on: 06/01/2021. She did attend session.  ? ? ?

## 2021-06-02 ENCOUNTER — Emergency Department: Payer: Medicare Other

## 2021-06-02 ENCOUNTER — Other Ambulatory Visit: Payer: Self-pay

## 2021-06-02 ENCOUNTER — Emergency Department
Admission: EM | Admit: 2021-06-02 | Discharge: 2021-06-02 | Disposition: A | Discharge status: Home / Self Care | Payer: Medicare Other | Attending: Emergency Medicine | Admitting: Emergency Medicine

## 2021-06-02 ENCOUNTER — Encounter: Payer: Medicare Other | Admitting: Physician Assistant

## 2021-06-02 DIAGNOSIS — R519 Headache, unspecified: Secondary | ICD-10-CM | POA: Diagnosis present

## 2021-06-02 DIAGNOSIS — E10621 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: Secondary | ICD-10-CM | POA: Diagnosis not present

## 2021-06-02 DIAGNOSIS — N186 End stage renal disease: Secondary | ICD-10-CM | POA: Diagnosis not present

## 2021-06-02 DIAGNOSIS — I120 Hypertensive chronic kidney disease with stage 5 chronic kidney disease or end stage renal disease: Secondary | ICD-10-CM | POA: Insufficient documentation

## 2021-06-02 DIAGNOSIS — E1122 Type 2 diabetes mellitus with diabetic chronic kidney disease: Secondary | ICD-10-CM | POA: Insufficient documentation

## 2021-06-02 DIAGNOSIS — R1084 Generalized abdominal pain: Secondary | ICD-10-CM | POA: Diagnosis not present

## 2021-06-02 LAB — CBC WITH DIFFERENTIAL/PLATELET
Abs Immature Granulocytes: 0.01 10*3/uL (ref 0.00–0.07)
Basophils Absolute: 0.1 10*3/uL (ref 0.0–0.1)
Basophils Relative: 1 %
Eosinophils Absolute: 0.1 10*3/uL (ref 0.0–0.5)
Eosinophils Relative: 2 %
HCT: 43 % (ref 36.0–46.0)
Hemoglobin: 12.8 g/dL (ref 12.0–15.0)
Immature Granulocytes: 0 %
Lymphocytes Relative: 23 %
Lymphs Abs: 1.5 10*3/uL (ref 0.7–4.0)
MCH: 27.4 pg (ref 26.0–34.0)
MCHC: 29.8 g/dL — ABNORMAL LOW (ref 30.0–36.0)
MCV: 92.1 fL (ref 80.0–100.0)
Monocytes Absolute: 0.4 10*3/uL (ref 0.1–1.0)
Monocytes Relative: 6 %
Neutro Abs: 4.6 10*3/uL (ref 1.7–7.7)
Neutrophils Relative %: 68 %
Platelets: 218 10*3/uL (ref 150–400)
RBC: 4.67 MIL/uL (ref 3.87–5.11)
RDW: 18.2 % — ABNORMAL HIGH (ref 11.5–15.5)
WBC: 6.6 10*3/uL (ref 4.0–10.5)
nRBC: 0 % (ref 0.0–0.2)

## 2021-06-02 LAB — COMPREHENSIVE METABOLIC PANEL
ALT: 13 U/L (ref 0–44)
AST: 19 U/L (ref 15–41)
Albumin: 4.3 g/dL (ref 3.5–5.0)
Alkaline Phosphatase: 70 U/L (ref 38–126)
Anion gap: 15 (ref 5–15)
BUN: 41 mg/dL — ABNORMAL HIGH (ref 6–20)
CO2: 28 mmol/L (ref 22–32)
Calcium: 9 mg/dL (ref 8.9–10.3)
Chloride: 93 mmol/L — ABNORMAL LOW (ref 98–111)
Creatinine, Ser: 6.6 mg/dL — ABNORMAL HIGH (ref 0.44–1.00)
GFR, Estimated: 8 mL/min — ABNORMAL LOW (ref >60)
Glucose, Bld: 176 mg/dL — ABNORMAL HIGH (ref 70–99)
Potassium: 4.5 mmol/L (ref 3.5–5.1)
Sodium: 136 mmol/L (ref 135–145)
Total Bilirubin: 0.8 mg/dL (ref 0.3–1.2)
Total Protein: 9.5 g/dL — ABNORMAL HIGH (ref 6.5–8.1)

## 2021-06-02 LAB — MAGNESIUM: Magnesium: 2.3 mg/dL (ref 1.7–2.4)

## 2021-06-02 LAB — LIPASE, BLOOD: Lipase: 66 U/L — ABNORMAL HIGH (ref 11–51)

## 2021-06-02 MED ORDER — DROPERIDOL 2.5 MG/ML IJ SOLN: 1.2500 mg | Freq: Once | INTRAMUSCULAR | Status: DC

## 2021-06-02 MED ORDER — FENTANYL CITRATE PF 50 MCG/ML IJ SOSY
50.0000 ug | PREFILLED_SYRINGE | Freq: Once | INTRAMUSCULAR | Status: AC
  Administered 2021-06-02: 50 ug via INTRAVENOUS
  Filled 2021-06-02: qty 1

## 2021-06-02 MED ORDER — DROPERIDOL 2.5 MG/ML IJ SOLN
1.2500 mg | Freq: Once | INTRAMUSCULAR | Status: AC
  Administered 2021-06-02: 1.25 mg via INTRAVENOUS
  Filled 2021-06-02: qty 2

## 2021-06-02 NOTE — ED Provider Notes (Signed)
? ?[facility] ?Provider Note ? ? ? Event Date/Time  ? First MD Initiated Contact with Patient 06/02/21 1229   ?  (approximate) ? ? ?History  ? ?Abdominal Pain and Headache ? ? ?HPI ? ?[name] is a 36 y.o. female who presents to the ED for evaluation of Abdominal Pain and Headache ?  ?I reviewed DC summary from medical admission from 3/19.  Admitted for 2 days due to hypertensive urgency and intractable abdominal pain. ?She has a medical history of diabetes, pseudoseizures, HTN and ESRD.  Normal gastric emptying study without gastroparesis in February.  History of esophageal strictures ?Seen at [facility] ED last night for abdominal pain and headache.  Dialyzed yesterday before this ED visit.  She was provided symptom relief, had a benign work-up and discharged home. ? ?Patient presents to the ED for evaluation of abdominal pain and headache.  She reports that she was in the car on the way to her normally scheduled wound clinic visit for chronic wound on her left foot when she developed abdominal pain and headache.  Reports being seen at the clinic, "they scraped some tissue and said it looked great, " but due to her symptoms that she was advised to come to the ED for evaluation. ? ?Denies any emesis, diarrhea, fever, neck stiffness, falls or syncope. ? ?Physical Exam  ? ?Triage Vital Signs: ?ED Triage Vitals  ?Enc Vitals Group  ?   BP 06/02/21 1205 (!) 176/121  ?   Pulse Rate 06/02/21 1205 96  ?   Resp 06/02/21 1205 16  ?   Temp 06/02/21 1205 98.3 ?F (36.8 ?C)  ?   Temp Source 06/02/21 1205 Oral  ?   SpO2 06/02/21 1205 99 %  ?   Weight 06/02/21 1211 167 lb 15.9 oz (76.2 kg)  ?   Height 06/02/21 1211 5\' 6"  (1.676 m)  ?   Head Circumference --   ?   Peak Flow --   ?   Pain Score 06/02/21 1211 10  ?   Pain Loc --   ?   Pain Edu? --   ?   Excl. in [place]? --   ? ? ?Most recent vital signs: ?Vitals:  ? 06/02/21 1315 06/02/21 1400  ?BP: (!) 192/130 (!) 157/109  ?Pulse: (!) 108 85  ?Resp: 20 13   ?Temp:    ?SpO2: 98% 100%  ? ? ?General: Awake, no distress.  Appears uncomfortable. ?CV:  Good peripheral perfusion.  ?Resp:  Normal effort.  ?Abd:  No distention.  Many well-healed surgical incisions.  Tender throughout the lower abdomen without peritoneal features. ?MSK:  No deformity noted.  ?Chronic wound with clean base and with good granulation tissue noted to the plantar left foot without purulence or erythema ?Neuro:  No focal deficits appreciated. ?Other:   ? ? ?ED Results / Procedures / Treatments  ? ?Labs ?(all labs ordered are listed, but only abnormal results are displayed) ?Labs Reviewed  ?LIPASE, BLOOD - Abnormal; Notable for the following components:  ?    Result Value  ? Lipase 66 (*)   ? All other components within normal limits  ?CBC WITH DIFFERENTIAL/PLATELET - Abnormal; Notable for the following components:  ? MCHC 29.8 (*)   ? RDW 18.2 (*)   ? All other components within normal limits  ?COMPREHENSIVE METABOLIC PANEL - Abnormal; Notable for the following components:  ? Chloride 93 (*)   ? Glucose, Bld 176 (*)   ? BUN 41 (*)   ?  Creatinine, Ser 6.60 (*)   ? Total Protein 9.5 (*)   ? GFR, Estimated 8 (*)   ? All other components within normal limits  ?MAGNESIUM  ? ? ?EKG ?Sinus tachycardia, rate of 102 bpm.  Normal axis and intervals.  Large and peaked T waves are present. ?EKG from yesterday during her ED visit with similar ? ?RADIOLOGY ?CT head obtained last night is reviewed by me without evidence of acute intracranial pathology. ? ?KUB reviewed by me without evidence of obstructive pathology. ? ?Official radiology report(s): ?DG Abdomen 1 View ? ?Result Date: 06/02/2021 ?CLINICAL DATA:  Abdominal pain EXAM: ABDOMEN - 1 VIEW COMPARISON:  06/01/2021 FINDINGS: Bowel gas pattern is nonspecific. There is contrast in the colon from previous studies. No abnormal masses or calcifications are seen. Kidneys are partly obscured by bowel contents. Surgical clips are seen in the right upper quadrant and  right lower quadrant. Visualized lower lung fields are clear. IMPRESSION: No radiographic abnormality is seen in the abdomen. Electronically Signed   By: Elmer Picker M.D.   On: 06/02/2021 13:31  ? ?CT Head Wo Contrast ? ?Result Date: 06/01/2021 ?CLINICAL DATA:  Sudden onset severe headache. EXAM: CT HEAD WITHOUT CONTRAST TECHNIQUE: Contiguous axial images were obtained from the base of the skull through the vertex without intravenous contrast. RADIATION DOSE REDUCTION: This exam was performed according to the departmental dose-optimization program which includes automated exposure control, adjustment of the mA and/or kV according to patient size and/or use of iterative reconstruction technique. COMPARISON:  Head CT 05/29/2021 FINDINGS: Brain: No evidence of acute infarction, hemorrhage, hydrocephalus, extra-axial collection or mass lesion/mass effect. There are benign dural calcifications scattered along the mid to anterior falx. Vascular: No hyperdense vessel or unexpected calcification. Skull: Normal. Negative for fracture or focal lesion. Sinuses/Orbits: No acute finding. Other: None. IMPRESSION: No acute intracranial CT findings or interval changes. Electronically Signed   By: Telford Nab M.D.   On: 06/01/2021 20:53  ? ?DG Abdomen Acute W/Chest ? ?Result Date: 06/01/2021 ?CLINICAL DATA:  Abdominal pain. EXAM: DG ABDOMEN ACUTE WITH 1 VIEW CHEST COMPARISON:  Abdominal x-ray 02/14/2020. CT abdomen and pelvis 05/30/2021. FINDINGS: There is residual contrast throughout the bowel. There is no evidence of dilated bowel loops or free intraperitoneal air. Cholecystectomy clips are present. There also surgical clips in the right pelvis. No radiopaque calculi or other significant radiographic abnormality is seen. Heart size and mediastinal contours are within normal limits. Both lungs are clear. IMPRESSION: Negative abdominal radiographs.  No acute cardiopulmonary disease. Electronically Signed   By: Ronney Asters  M.D.   On: 06/01/2021 21:13   ? ?PROCEDURES and INTERVENTIONS: ? ?.1-3 Lead EKG Interpretation ?Performed by: Vladimir Crofts, MD ?Authorized by: Vladimir Crofts, MD  ? ?  Interpretation: normal   ?  ECG rate:  80 ?  ECG rate assessment: normal   ?  Rhythm: sinus rhythm   ?  Ectopy: none   ?  Conduction: normal   ? ?Medications  ?fentaNYL (SUBLIMAZE) injection 50 mcg (50 mcg Intravenous Given 06/02/21 1308)  ?droperidol (INAPSINE) 2.5 MG/ML injection 1.25 mg (1.25 mg Intravenous Given 06/02/21 1308)  ? ? ? ?IMPRESSION / MDM / ASSESSMENT AND PLAN / ED COURSE  ?I reviewed the triage vital signs and the nursing notes. ? ?36 year old dialysis patient presents to the ED with recurrence of global headache and abdominal pain, possibly due to mild acute idiopathic pancreatitis, and suitable for outpatient management.  She appears quite uncomfortable on presentation, but her clinical  picture improves with small dose of IV droperidol.  She is observed for nearly 3 hours and has had no recurrence of significant pain.  She has been tolerating p.o. intake.  Blood work with mildly elevated lipase suggestive of pancreatitis.  Normal CBC and CMP, with chronic stigmata of ESRD.  No significant electrolyte derangements.  KUB obtained with nonobstructive bowel gas pattern.  Doubt SBO or perforation.  Considering the work-ups that she has had in the past 3 to 4 days, see no indications for further imaging or diagnostics today considering her rapid improvement.  We discussed adherence to dialysis and appropriate return precautions for the ED. ? ?Clinical Course as of 06/02/21 1430  ?Tue Jun 02, 2021  ?1317 Reassessed.  Much more comfortable after IV pain medications [DS]  ?[place].  Resting comfortably.  Reports feeling a lot better than upon presentation. [DS]  ?1418 Reassessed and I gave her some water.  She is sitting up and sipping on this and reports feeling okay.  We discussed pancreatitis with mild elevation of lipase. [DS]  ?   ?Clinical Course User Index ?[DS] Vladimir Crofts, MD  ? ? ? ?FINAL CLINICAL IMPRESSION(S) / ED DIAGNOSES  ? ?Final diagnoses:  ?Bad headache  ?Generalized abdominal pain  ? ? ? ?Rx / DC Orders  ? ?ED Discha

## 2021-06-02 NOTE — Discharge Instructions (Signed)
Please make sure to make your dialysis appointment tomorrow. ? ?Pick up the pain and nausea prescriptions were prescribed in the ED yesterday. ?
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
## 2021-06-02 NOTE — ED Notes (Signed)
Patient O2 dropped down to 84% on RA. Patient placed on 2L [place] with O@ now at 99%. Tamala Julian, MD aware. ?

## 2021-06-02 NOTE — Progress Notes (Addendum)
[name] ([id number]) ?Visit Report for 06/02/2021 ?Arrival Information Details ?Patient Name: [name], [name] ?Date of Service: 06/02/2021 10:30 AM ?Medical Record Number: [id number] ?Patient Account Number: [id number] ?Date of Birth/Sex: [birth year] (36 y.o. F) ?Treating RN: Levora Dredge ?Primary Care Havoc Sanluis: [name] Other Clinician: ?Referring Korene Dula: [name] ?Treating Airen Dales/Extender: Jeri Cos ?Weeks in Treatment: 14 ?Visit Information History Since Last Visit ?Added or deleted any medications: No ?Patient Arrived: Ambulatory ?Any new allergies or adverse reactions: No ?Arrival Time: 10:51 ?Had a fall or experienced change in No ?Accompanied By: self ?activities of daily living that may affect ?Transfer Assistance: None ?risk of falls: ?Patient Identification Verified: Yes ?Hospitalized since last visit: Yes ?Secondary Verification Process Completed: Yes ?Has Dressing in Place as Prescribed: Yes ?Patient Requires Transmission-Based Precautions: No ?Pain Present Now: No ?Patient Has Alerts: Yes ?Patient Alerts: DIABETIC ?Notes ?pt states was at hospital yesterday 3/20 for abdominal issues, pt states no changes in medication, was only given pain meds ?Electronic Signature(s) ?Signed: 06/02/2021 4:35:55 PM By: Levora Dredge ?Entered By: Levora Dredge on 06/02/2021 10:57:27 ?KEVONA, LUPINACCI ([id number]) ?-------------------------------------------------------------------------------- ?Clinic Level of Care Assessment Details ?Patient Name: [name] ?Date of Service: 06/02/2021 10:30 AM ?Medical Record Number: [id number] ?Patient Account Number: [id number] ?Date of Birth/Sex: 08/07/1985 (36 y.o. F) ?Treating RN: Levora Dredge ?Primary Care Ewen Varnell: [name] Other Clinician: ?Referring Antonetta Clanton: [name] ?Treating Everitt Wenner/Extender: Jeri Cos ?Weeks in Treatment: 14 ?Clinic Level of Care Assessment Items ?TOOL 4 Quantity Score ?[]  - Use when only an EandM is performed on  FOLLOW-UP visit 0 ?ASSESSMENTS - Nursing Assessment / Reassessment ?[]  - Reassessment of Co-morbidities (includes updates in patient status) 0 ?X- 1 5 ?Reassessment of Adherence to Treatment Plan ?ASSESSMENTS - Wound and Skin Assessment / Reassessment ?X - Simple Wound Assessment / Reassessment - one wound 1 5 ?[]  - 0 ?Complex Wound Assessment / Reassessment - multiple wounds ?[]  - 0 ?Dermatologic / Skin Assessment (not related to wound area) ?ASSESSMENTS - Focused Assessment ?[]  - Circumferential Edema Measurements - multi extremities 0 ?[]  - 0 ?Nutritional Assessment / Counseling / Intervention ?[]  - 0 ?Lower Extremity Assessment (monofilament, tuning fork, pulses) ?[]  - 0 ?Peripheral Arterial Disease Assessment (using hand held doppler) ?ASSESSMENTS - Ostomy and/or Continence Assessment and Care ?[]  - Incontinence Assessment and Management 0 ?[]  - 0 ?Ostomy Care Assessment and Management (repouching, etc.) ?PROCESS - Coordination of Care ?X - Simple Patient / Family Education for ongoing care 1 15 ?[]  - 0 ?Complex (extensive) Patient / Family Education for ongoing care ?[]  - 0 ?Staff obtains Consents, Records, Test Results / Process Orders ?[]  - 0 ?Staff telephones Vera, Nursing Homes / Clarify orders / etc ?[]  - 0 ?Routine Transfer to another Facility (non-emergent condition) ?[]  - 0 ?Routine Hospital Admission (non-emergent condition) ?[]  - 0 ?New Admissions / Biomedical engineer / Ordering NPWT, Apligraf, etc. ?[]  - 0 ?Emergency Hospital Admission (emergent condition) ?X- 1 10 ?Simple Discharge Coordination ?[]  - 0 ?Complex (extensive) Discharge Coordination ?PROCESS - Special Needs ?[]  - Pediatric / Minor Patient Management 0 ?[]  - 0 ?Isolation Patient Management ?[]  - 0 ?Hearing / Language / Visual special needs ?[]  - 0 ?Assessment of Community assistance (transportation, D/C planning, etc.) ?[]  - 0 ?Additional assistance / Altered mentation ?[]  - 0 ?Support Surface(s) Assessment (bed, cushion, seat,  etc.) ?INTERVENTIONS - Wound Cleansing / Measurement ?YURIDIANA, FORMANEK ([id number]) ?X- 1 5 ?Simple Wound Cleansing - one wound ?[]  - 0 ?Complex Wound Cleansing - multiple wounds ?X- 1 5 ?  Wound Imaging (photographs - any number of wounds) ?[]  - 0 ?Wound Tracing (instead of photographs) ?X- 1 5 ?Simple Wound Measurement - one wound ?[]  - 0 ?Complex Wound Measurement - multiple wounds ?INTERVENTIONS - Wound Dressings ?X - Small Wound Dressing one or multiple wounds 1 10 ?[]  - 0 ?Medium Wound Dressing one or multiple wounds ?[]  - 0 ?Large Wound Dressing one or multiple wounds ?[]  - 0 ?Application of Medications - topical ?[]  - 0 ?Application of Medications - injection ?INTERVENTIONS - Miscellaneous ?[]  - External ear exam 0 ?[]  - 0 ?Specimen Collection (cultures, biopsies, blood, body fluids, etc.) ?[]  - 0 ?Specimen(s) / Culture(s) sent or taken to Lab for analysis ?[]  - 0 ?Patient Transfer (multiple staff / Civil Service fast streamer / Similar devices) ?[]  - 0 ?Simple Staple / Suture removal (25 or less) ?[]  - 0 ?Complex Staple / Suture removal (26 or more) ?[]  - 0 ?Hypo / Hyperglycemic Management (close monitor of Blood Glucose) ?[]  - 0 ?Ankle / Brachial Index (ABI) - do not check if billed separately ?X- 1 5 ?Vital Signs ?Has the patient been seen at the hospital within the last three years: Yes ?Total Score: 65 ?Level Of Care: New/Established - Level ?2 ?Electronic Signature(s) ?Signed: 06/02/2021 4:35:55 PM By: Levora Dredge ?Entered By: Levora Dredge on 06/02/2021 15:01:49 ?KHYLER, URDA ([id number]) ?-------------------------------------------------------------------------------- ?Complex / Palliative Patient Assessment Details ?Patient Name: [name] ?Date of Service: 06/02/2021 10:30 AM ?Medical Record Number: [id number] ?Patient Account Number: [id number] ?Date of Birth/Sex: Nov 04, 1985 (36 y.o. F) ?Treating RN: Donnamarie Poag ?Primary Care Aianna Fahs: [name] Other Clinician: ?Referring Yulanda Diggs: [name] ?Treating Dujuan Stankowski/Extender: Jeri Cos ?Weeks in Treatment: 14 ?Complex Wound Management Criteria ?Patient has remarkable or complex co-morbidities requiring medications or treatments that extend wound healing times. Examples: ?o? Diabetes mellitus with chronic renal failure or end stage renal disease requiring dialysis ?o? Advanced or poorly controlled rheumatoid arthritis ?o? Diabetes mellitus and end stage chronic obstructive pulmonary disease ?o? Active cancer with current chemo- or radiation therapy ?DM, end stage kidney disease with dialysis ?Palliative Wound Management Criteria ?Care Approach ?Wound Care Plan: Complex Wound Management ?Electronic Signature(s) ?Signed: 06/08/2021 1:28:31 PM By: Donnamarie Poag ?Signed: 06/08/2021 3:51:56 PM By: Worthy [name] PA-C ?Entered By: Donnamarie Poag on 06/08/2021 13:28:30 ?[name] ([id number]) ?-------------------------------------------------------------------------------- ?Encounter Discharge Information Details ?Patient Name: SARABI, SOCKWELL ?Date of Service: 06/02/2021 10:30 AM ?Medical Record Number: [id number] ?Patient Account Number: [id number] ?Date of Birth/Sex: 22-Feb-1986 (35 y.o. F) ?Treating RN: Levora Dredge ?Primary Care Edword Cu: [name] Other Clinician: ?Referring Herlinda Heady: [name] ?Treating Alda Gaultney/Extender: Jeri Cos ?Weeks in Treatment: 14 ?Encounter Discharge Information Items Post Procedure Vitals ?Discharge Condition: Stable ?Temperature (?F): 97.7 ?Ambulatory Status: Ambulatory ?Pulse (bpm): 102 ?Discharge Destination: Emergency Room ?Respiratory Rate (breaths/min): 18 ?Telephoned: Yes ?Blood Pressure (mmHg): 189/117 ?Spoke With: Camera operator per PA Stone ?Transportation: Private Auto ?Accompanied By: self boyfriend drive pt ?Schedule Follow-up Appointment: No ?Clinical Summary of Care: ?Notes ?pt advised to go to ED for unrelated abdominal pain patient was stating ?Electronic Signature(s) ?Signed: 06/02/2021 3:04:01 PM  By: Levora Dredge ?Entered By: Levora Dredge on 06/02/2021 15:04:01 ?RASHAE, ROTHER ([id number]) ?-------------------------------------------------------------------------------- ?Lower Extremity Assessment Deta

## 2021-06-02 NOTE — ED Triage Notes (Addendum)
Pt states that she was on the way to the wound center for her left foot and she started having pain in her abd and head, pt was hypertensive at the wound center and was told to be evaluated further. Pt reports last dialysis was yesterday ?

## 2021-06-09 ENCOUNTER — Encounter: Payer: Medicare Other | Admitting: Physician Assistant

## 2021-06-10 ENCOUNTER — Emergency Department (HOSPITAL_COMMUNITY): Payer: Medicare Other

## 2021-06-10 ENCOUNTER — Other Ambulatory Visit: Payer: Self-pay

## 2021-06-10 ENCOUNTER — Emergency Department (HOSPITAL_COMMUNITY)
Admission: EM | Admit: 2021-06-10 | Discharge: 2021-06-10 | Disposition: A | Discharge status: Home / Self Care | Payer: Medicare Other | Attending: Emergency Medicine | Admitting: Emergency Medicine

## 2021-06-10 DIAGNOSIS — R112 Nausea with vomiting, unspecified: Secondary | ICD-10-CM | POA: Insufficient documentation

## 2021-06-10 DIAGNOSIS — N186 End stage renal disease: Secondary | ICD-10-CM | POA: Insufficient documentation

## 2021-06-10 DIAGNOSIS — E1022 Type 1 diabetes mellitus with diabetic chronic kidney disease: Secondary | ICD-10-CM | POA: Insufficient documentation

## 2021-06-10 DIAGNOSIS — I120 Hypertensive chronic kidney disease with stage 5 chronic kidney disease or end stage renal disease: Secondary | ICD-10-CM | POA: Diagnosis not present

## 2021-06-10 DIAGNOSIS — E876 Hypokalemia: Secondary | ICD-10-CM | POA: Diagnosis not present

## 2021-06-10 DIAGNOSIS — Z794 Long term (current) use of insulin: Secondary | ICD-10-CM | POA: Diagnosis not present

## 2021-06-10 DIAGNOSIS — R1013 Epigastric pain: Secondary | ICD-10-CM | POA: Diagnosis not present

## 2021-06-10 DIAGNOSIS — Z79899 Other long term (current) drug therapy: Secondary | ICD-10-CM | POA: Diagnosis not present

## 2021-06-10 DIAGNOSIS — I10 Essential (primary) hypertension: Secondary | ICD-10-CM

## 2021-06-10 LAB — COMPREHENSIVE METABOLIC PANEL
ALT: 14 U/L (ref 0–44)
AST: 18 U/L (ref 15–41)
Albumin: 4 g/dL (ref 3.5–5.0)
Alkaline Phosphatase: 53 U/L (ref 38–126)
Anion gap: 12 (ref 5–15)
BUN: 8 mg/dL (ref 6–20)
CO2: 28 mmol/L (ref 22–32)
Calcium: 9.7 mg/dL (ref 8.9–10.3)
Chloride: 97 mmol/L — ABNORMAL LOW (ref 98–111)
Creatinine, Ser: 4.95 mg/dL — ABNORMAL HIGH (ref 0.44–1.00)
GFR, Estimated: 11 mL/min — ABNORMAL LOW (ref >60)
Glucose, Bld: 176 mg/dL — ABNORMAL HIGH (ref 70–99)
Potassium: 3.3 mmol/L — ABNORMAL LOW (ref 3.5–5.1)
Sodium: 137 mmol/L (ref 135–145)
Total Bilirubin: 0.9 mg/dL (ref 0.3–1.2)
Total Protein: 8.3 g/dL — ABNORMAL HIGH (ref 6.5–8.1)

## 2021-06-10 LAB — CBC
HCT: 41.5 % (ref 36.0–46.0)
Hemoglobin: 13.2 g/dL (ref 12.0–15.0)
MCH: 28.8 pg (ref 26.0–34.0)
MCHC: 31.8 g/dL (ref 30.0–36.0)
MCV: 90.4 fL (ref 80.0–100.0)
Platelets: 175 10*3/uL (ref 150–400)
RBC: 4.59 MIL/uL (ref 3.87–5.11)
RDW: 17.2 % — ABNORMAL HIGH (ref 11.5–15.5)
WBC: 5.3 10*3/uL (ref 4.0–10.5)
nRBC: 0 % (ref 0.0–0.2)

## 2021-06-10 LAB — HCG, QUANTITATIVE, PREGNANCY: hCG, Beta Chain, Quant, S: 8 m[IU]/mL — ABNORMAL HIGH (ref <5)

## 2021-06-10 LAB — LIPASE, BLOOD: Lipase: 41 U/L (ref 11–51)

## 2021-06-10 MED ORDER — LABETALOL HCL 5 MG/ML IV SOLN
10.0000 mg | Freq: Once | INTRAVENOUS | Status: AC
  Administered 2021-06-10: 10 mg via INTRAVENOUS
  Filled 2021-06-10: qty 4

## 2021-06-10 MED ORDER — METOCLOPRAMIDE HCL 5 MG/ML IJ SOLN
10.0000 mg | Freq: Once | INTRAMUSCULAR | Status: AC
  Administered 2021-06-10: 10 mg via INTRAVENOUS
  Filled 2021-06-10: qty 2

## 2021-06-10 MED ORDER — HYDROMORPHONE HCL 1 MG/ML IJ SOLN
1.0000 mg | Freq: Once | INTRAMUSCULAR | Status: AC
  Administered 2021-06-10: 1 mg via INTRAVENOUS
  Filled 2021-06-10: qty 1

## 2021-06-10 MED ORDER — METOPROLOL TARTRATE 25 MG PO TABS: 25.0000 mg | ORAL_TABLET | Freq: Once | ORAL | Status: DC

## 2021-06-10 MED ORDER — METOCLOPRAMIDE HCL 10 MG PO TABS: 10.0000 mg | ORAL_TABLET | Freq: Four times a day (QID) | ORAL | 0 refills | Status: DC

## 2021-06-10 MED ORDER — LABETALOL HCL 5 MG/ML IV SOLN: 10.0000 mg | Freq: Once | INTRAVENOUS | Status: DC

## 2021-06-10 MED ORDER — SODIUM CHLORIDE 0.9 % IV BOLUS
500.0000 mL | Freq: Once | INTRAVENOUS | Status: AC
  Administered 2021-06-10: 500 mL via INTRAVENOUS

## 2021-06-10 MED ORDER — FENTANYL CITRATE PF 50 MCG/ML IJ SOSY
50.0000 ug | PREFILLED_SYRINGE | Freq: Once | INTRAMUSCULAR | Status: AC
  Administered 2021-06-10: 50 ug via INTRAVENOUS
  Filled 2021-06-10: qty 1

## 2021-06-10 NOTE — ED Notes (Signed)
Pt resting in bed with improved pain. Pt updated regarding when she is going for CT. ?

## 2021-06-10 NOTE — ED Notes (Addendum)
Per Stephania Fragmin PA Pt was seen at dialysis today and did not have fluid pulled. Pt has not bee eating and has lost weight. ?

## 2021-06-10 NOTE — ED Notes (Signed)
Pt c/o abdominal pain and requesting pain meds. PA notified. ?

## 2021-06-10 NOTE — ED Triage Notes (Signed)
Pt. Stated, [name] had stomach pain for a week, and I had dialysis  today and had 30 min left.  Pt crying in triage with pain. When they are pulling fluid off it makes my stomach hurts bad. Im getting to my dry weight. ?

## 2021-06-10 NOTE — ED Provider Notes (Signed)
?[facility] ?Provider Note ? ? ?CSN: [id number] ?Arrival date & time: 06/10/21  1021 ? ?  ? ?History ? ?Chief Complaint  ?Patient presents with  ? Abdominal Pain  ? Emesis  ? Nausea  ? ? ?[name] is a 36 y.o. female who presents the emergency department complaining of upper abdominal pain for 1 week.  She states that the pain radiates to her back.  She feels as though they are pulling too much fluid off of her during dialysis.  She is a Monday Wednesday Friday dialysis patient, who finished all but 30 minutes of her treatment today.  She states that her dry weight is normally 78 kg, but they keep pulling fluid off until she is 76 kg.  She states that she has missed no dialysis treatments.  She has been trying to take her prescribed pain medication, but continues to vomit up all of the food and does not want to take pain medication on empty stomach. ? ? ?Abdominal Pain ?Associated symptoms: nausea and vomiting   ?Associated symptoms: no chest pain, no diarrhea and no fever   ?Emesis ?Associated symptoms: abdominal pain   ?Associated symptoms: no diarrhea and no fever   ? ?  ? ?Home Medications ?Prior to Admission medications   ?Medication Sig Start Date End Date Taking? Authorizing Provider  ?metoCLOPramide (REGLAN) 10 MG tablet Take 1 tablet (10 mg total) by mouth every 6 (six) hours. 06/10/21  Yes Neizan Debruhl T, PA-C  ?ACCU-CHEK SOFTCLIX LANCETS lancets Use to check blood sugar 4 times per day. 12/29/15   Renato Shin, MD  ?acetaminophen (TYLENOL) 500 MG tablet Take 500-1,000 mg by mouth every 6 (six) hours as needed for moderate pain.    [provider]  ?calcium acetate (PHOSLO) 667 MG capsule Take 1,334 mg by mouth 3 (three) times daily with meals. Take 1-2 capsules (308-6578 mg) by mouth with snacks & take 2 capsules (1334 mg) by mouth with each meal 10/26/20   [provider]  ?Calcium Carbonate Antacid (CALCIUM CARBONATE, DOSED IN MG ELEMENTAL  CALCIUM,) 1250 MG/5ML SUSP Take 500 mg of elemental calcium by mouth daily. 10/22/20   [provider]  ?cetirizine (ZYRTEC) 10 MG tablet Take 10 mg by mouth daily.    [provider]  ?famotidine (PEPCID) 40 MG tablet TAKE 1 TABLET BY MOUTH TWICE DAILY IN THE MORNING AND AT BEDTIME 03/17/21   Gatha Mayer, MD  ?fluticasone [facility]) 50 MCG/ACT nasal spray Place 2 sprays into both nostrils daily as needed for allergies. 09/03/18   [name], MD  ?gabapentin (NEURONTIN) 400 MG capsule Take 800-1,200 mg by mouth at bedtime. 12/30/20   [provider]  ?hydrOXYzine (ATARAX/VISTARIL) 50 MG tablet Take 50 mg by mouth 2 (two) times daily as needed for anxiety. 11/03/20   [provider]  ?insulin aspart (NOVOLOG FLEXPEN) 100 UNIT/ML FlexPen Inject 0-15 Units into the skin in the morning, at noon, in the evening, and at bedtime. Sliding Scale insulin    [provider]  ?insulin degludec (TRESIBA) 100 UNIT/ML FlexTouch Pen Inject 20 Units into the skin daily. ?Patient taking differently: Inject 20 Units into the skin daily. 02/15/20   Dwyane Dee, MD  ?Insulin Pen Needle (BD PEN NEEDLE NANO 2ND GEN) 32G X 4 MM MISC 1 each by Does not apply route in the morning, at noon, in the evening, and at bedtime. 08/10/20   Barb Merino, MD  ?lidocaine (XYLOCAINE) 2 % solution  Use as directed 15 mLs in the mouth or throat every 6 (six) hours as needed for mouth pain. 11/11/20   Fatima Blank, MD  ?lidocaine-prilocaine (EMLA) cream Apply 1 application topically daily as needed [facility]). 12/17/20   [provider]  ?loperamide (IMODIUM) 2 MG capsule Take 2 mg by mouth as needed for diarrhea or loose stools. 09/11/20   [provider]  ?metoCLOPramide (REGLAN) 10 MG tablet Take 1 tablet (10 mg total) by mouth 4 (four) times daily -  before meals and at bedtime. 03/17/21   Gatha Mayer, MD  ?metoprolol tartrate (LOPRESSOR) 25 MG tablet Take 1 tablet (25 mg total) by  mouth 2 (two) times daily. ?Patient taking differently: Take 25 mg by mouth in the morning. 02/15/20   Dwyane Dee, MD  ?omeprazole (PRILOSEC) 40 MG capsule Take 1 capsule (40 mg total) by mouth 2 (two) times daily before a meal. Open capsule and place granules in water or applesauce to swallow 03/17/21   Gatha Mayer, MD  ?ondansetron (ZOFRAN-ODT) 4 MG disintegrating tablet Take 4 mg by mouth every 8 (eight) hours as needed for vomiting or nausea. 11/04/20   [provider]  ?ondansetron (ZOFRAN-ODT) 8 MG disintegrating tablet Take 1 tablet (8 mg total) by mouth every 8 (eight) hours as needed for nausea or vomiting. 06/01/21   Dorie Rank, MD  ?oxyCODONE-acetaminophen (PERCOCET/ROXICET) 5-325 MG tablet Take 0.25 tablets by mouth 2 (two) times daily as needed (leg pain). 02/20/21   [provider]  ?oxyCODONE-acetaminophen (PERCOCET/ROXICET) 5-325 MG tablet Take 1 tablet by mouth every 6 (six) hours as needed for severe pain. 06/01/21   Dorie Rank, MD  ?silver sulfADIAZINE (SILVADENE) 1 % cream Apply 1 application topically daily. 02/06/21   Henderly, Britni A, PA-C  ?simvastatin (ZOCOR) 20 MG tablet Take 20 mg by mouth at bedtime.    [provider]  ?   ? ?Allergies    ?Diphenhydramine, Motrin [ibuprofen], Contrast media [iodinated contrast media], Shellfish allergy, Banana, Chlorhexidine, Ferrous sulfate, and Iron dextran   ? ?Review of Systems   ?Review of Systems  ?Constitutional:  Negative for fever.  ?Cardiovascular:  Negative for chest pain.  ?Gastrointestinal:  Positive for abdominal pain, nausea and vomiting. Negative for diarrhea.  ?Genitourinary:   ?     Does not make urine  ?All other systems reviewed and are negative. ? ?Physical Exam ?Updated Vital Signs ?BP (!) 148/116   Pulse 88   Temp 97.8 ?F (36.6 ?C)   Resp 18   LMP  (LMP Unknown)   SpO2 100%  ?Physical Exam ?Vitals and nursing note reviewed.  ?Constitutional:   ?   Appearance: Normal appearance.  ?   Comments:  Patient doubled over and crying  ?HENT:  ?   Head: Normocephalic and atraumatic.  ?Eyes:  ?   Conjunctiva/sclera: Conjunctivae normal.  ?Cardiovascular:  ?   Rate and Rhythm: Normal rate and regular rhythm.  ?Pulmonary:  ?   Effort: Pulmonary effort is normal. No respiratory distress.  ?   Breath sounds: Normal breath sounds.  ?Abdominal:  ?   General: There is no distension.  ?   Palpations: Abdomen is soft.  ?   Tenderness: There is abdominal tenderness in the epigastric area. There is guarding. There is no right CVA tenderness or rebound.  ?Skin: ?   General: Skin is warm and dry.  ?Neurological:  ?   General: No focal deficit present.  ?   Mental Status: She is  alert.  ? ? ?ED Results / Procedures / Treatments   ?Labs ?(all labs ordered are listed, but only abnormal results are displayed) ?Labs Reviewed  ?COMPREHENSIVE METABOLIC PANEL - Abnormal; Notable for the following components:  ?    Result Value  ? Potassium 3.3 (*)   ? Chloride 97 (*)   ? Glucose, Bld 176 (*)   ? Creatinine, Ser 4.95 (*)   ? Total Protein 8.3 (*)   ? GFR, Estimated 11 (*)   ? All other components within normal limits  ?CBC - Abnormal; Notable for the following components:  ? RDW 17.2 (*)   ? All other components within normal limits  ?HCG, QUANTITATIVE, PREGNANCY - Abnormal; Notable for the following components:  ? hCG, Beta Chain, Quant, S 8 (*)   ? All other components within normal limits  ?LIPASE, BLOOD  ?I-STAT BETA HCG BLOOD, ED ([facility], [facility], [facility] ONLY)  ? ? ?EKG ?None ? ?Radiology ?CT ABDOMEN PELVIS WO CONTRAST ? ?Result Date: 06/10/2021 ?CLINICAL DATA:  Abdominal pain, nausea, vomiting EXAM: CT ABDOMEN AND PELVIS WITHOUT CONTRAST TECHNIQUE: Multidetector CT imaging of the abdomen and pelvis was performed following the standard protocol without IV contrast. RADIATION DOSE REDUCTION: This exam was performed according to the departmental dose-optimization program which includes automated exposure control, adjustment of the mA and/or kV  according to patient size and/or use of iterative reconstruction technique. COMPARISON:  05/30/2021 FINDINGS: Lower chest: Interval resolution of previously noted patchy ground-glass opacities in the imaged lungs

## 2021-06-10 NOTE — ED Provider Triage Note (Signed)
Emergency Medicine Provider Triage Evaluation Note ? ?[name] , a 36 y.o. female  was evaluated in triage.  Pt complains of upper abdominal pain.  Patient states she has had symptoms for about a week, but it got significantly worse while she was at dialysis today.  She had her whole treatment up until the last 30 minutes.  No other missed dialysis appointments.  She is a Monday Wednesday Friday dialysis patient.  She states that she has been trying to take her prescribed pain medication, but is vomiting up her food and does not take her medication on an empty stomach. ? ?Review of Systems  ?Positive: Abdominal pain, nausea, vomiting ?Negative: Diarrhea, fever ? ?Physical Exam  ?BP (!) 223/130 (BP Location: Right Arm)   Pulse (!) 108   Temp 97.8 ?F (36.6 ?C)   Resp 18   SpO2 100%  ?Gen:   Awake, no distress   ?Resp:  Normal effort  ?MSK:   Moves extremities without difficulty  ?Other:  Patient tearful in triage ? ?Medical Decision Making  ?Medically screening exam initiated at 10:49 AM.  Appropriate orders placed.  [name] was informed that the remainder of the evaluation will be completed by another provider, this initial triage assessment does not replace that evaluation, and the importance of remaining in the ED until their evaluation is complete. ? ? ?  ?Kion Huntsberry T, PA-C ?06/10/21 1050 ? ?

## 2021-06-10 NOTE — ED Notes (Signed)
Pt ambulated to bathroom independently, no apparent distress noted ?

## 2021-06-10 NOTE — Discharge Instructions (Addendum)
You were seen in the emergency department for abdominal pain and vomiting.  ? ?Your lab work and imaging was reassuring today.  Your blood pressure was initially high, but we have been able to get it down with medicine.  You can take your blood pressure medicine again tomorrow morning. Please follow up with your primary doctor to discuss your blood pressure management.  ? ?I've sent you an additional prescription of nausea medicine to the pharmacy in case you need it. ? ?Continue to monitor how you're doing and return to the ER for new or worsening symptoms.  ?

## 2021-06-10 NOTE — ED Notes (Signed)
RN called lab to add on quantitative hcg lab.  ?

## 2021-06-10 NOTE — ED Notes (Addendum)
RN introduced self to pt. Pt c/o abdominal pain that goes to her back. States they have been pulling off too much during dialysis. This is the second time that she is under her dry weight. Pt dry weight is usually 78kg, she is at 76kg.  ?

## 2021-06-11 ENCOUNTER — Other Ambulatory Visit: Payer: Self-pay

## 2021-06-11 ENCOUNTER — Emergency Department (HOSPITAL_BASED_OUTPATIENT_CLINIC_OR_DEPARTMENT_OTHER)
Admission: EM | Admit: 2021-06-11 | Discharge: 2021-06-11 | Disposition: A | Discharge status: Home / Self Care | Payer: Medicare Other | Attending: Emergency Medicine | Admitting: Emergency Medicine

## 2021-06-11 ENCOUNTER — Encounter (HOSPITAL_BASED_OUTPATIENT_CLINIC_OR_DEPARTMENT_OTHER): Payer: Self-pay | Admitting: Emergency Medicine

## 2021-06-11 DIAGNOSIS — R1084 Generalized abdominal pain: Secondary | ICD-10-CM | POA: Diagnosis present

## 2021-06-11 DIAGNOSIS — Z794 Long term (current) use of insulin: Secondary | ICD-10-CM | POA: Insufficient documentation

## 2021-06-11 DIAGNOSIS — Z79899 Other long term (current) drug therapy: Secondary | ICD-10-CM | POA: Diagnosis not present

## 2021-06-11 LAB — CBC WITH DIFFERENTIAL/PLATELET
Abs Immature Granulocytes: 0.02 10*3/uL (ref 0.00–0.07)
Basophils Absolute: 0 10*3/uL (ref 0.0–0.1)
Basophils Relative: 1 %
Eosinophils Absolute: 0.2 10*3/uL (ref 0.0–0.5)
Eosinophils Relative: 3 %
HCT: 37.2 % (ref 36.0–46.0)
Hemoglobin: 11.5 g/dL — ABNORMAL LOW (ref 12.0–15.0)
Immature Granulocytes: 0 %
Lymphocytes Relative: 34 %
Lymphs Abs: 1.7 10*3/uL (ref 0.7–4.0)
MCH: 28 pg (ref 26.0–34.0)
MCHC: 30.9 g/dL (ref 30.0–36.0)
MCV: 90.7 fL (ref 80.0–100.0)
Monocytes Absolute: 0.5 10*3/uL (ref 0.1–1.0)
Monocytes Relative: 9 %
Neutro Abs: 2.7 10*3/uL (ref 1.7–7.7)
Neutrophils Relative %: 53 %
Platelets: 151 10*3/uL (ref 150–400)
RBC: 4.1 MIL/uL (ref 3.87–5.11)
RDW: 17.4 % — ABNORMAL HIGH (ref 11.5–15.5)
WBC: 5.1 10*3/uL (ref 4.0–10.5)
nRBC: 0 % (ref 0.0–0.2)

## 2021-06-11 LAB — LACTIC ACID, PLASMA: Lactic Acid, Venous: 1 mmol/L (ref 0.5–1.9)

## 2021-06-11 LAB — COMPREHENSIVE METABOLIC PANEL
ALT: 11 U/L (ref 0–44)
AST: 14 U/L — ABNORMAL LOW (ref 15–41)
Albumin: 4.5 g/dL (ref 3.5–5.0)
Alkaline Phosphatase: 55 U/L (ref 38–126)
Anion gap: 12 (ref 5–15)
BUN: 19 mg/dL (ref 6–20)
CO2: 28 mmol/L (ref 22–32)
Calcium: 9.5 mg/dL (ref 8.9–10.3)
Chloride: 97 mmol/L — ABNORMAL LOW (ref 98–111)
Creatinine, Ser: 8.25 mg/dL — ABNORMAL HIGH (ref 0.44–1.00)
GFR, Estimated: 6 mL/min — ABNORMAL LOW (ref >60)
Glucose, Bld: 199 mg/dL — ABNORMAL HIGH (ref 70–99)
Potassium: 3.3 mmol/L — ABNORMAL LOW (ref 3.5–5.1)
Sodium: 137 mmol/L (ref 135–145)
Total Bilirubin: 0.5 mg/dL (ref 0.3–1.2)
Total Protein: 8.4 g/dL — ABNORMAL HIGH (ref 6.5–8.1)

## 2021-06-11 LAB — PROTIME-INR
INR: 1 (ref 0.8–1.2)
Prothrombin Time: 12.9 seconds (ref 11.4–15.2)

## 2021-06-11 LAB — HCG, SERUM, QUALITATIVE: Preg, Serum: NEGATIVE

## 2021-06-11 MED ORDER — METOCLOPRAMIDE HCL 5 MG/ML IJ SOLN
10.0000 mg | Freq: Once | INTRAMUSCULAR | Status: AC
Start: 2021-06-11 — End: 2021-06-11
  Administered 2021-06-11: 10 mg via INTRAVENOUS
  Filled 2021-06-11: qty 2

## 2021-06-11 MED ORDER — HYDROMORPHONE HCL 1 MG/ML IJ SOLN
1.0000 mg | Freq: Once | INTRAMUSCULAR | Status: AC
  Administered 2021-06-11: 1 mg via INTRAVENOUS
  Filled 2021-06-11: qty 1

## 2021-06-11 MED ORDER — OXYCODONE-ACETAMINOPHEN 5-325 MG PO TABS
1.0000 | ORAL_TABLET | Freq: Once | ORAL | Status: AC
  Administered 2021-06-11: 1 via ORAL
  Filled 2021-06-11: qty 1

## 2021-06-11 MED ORDER — METOCLOPRAMIDE HCL 10 MG PO TABS: 10.0000 mg | ORAL_TABLET | Freq: Four times a day (QID) | ORAL | 0 refills | Status: DC

## 2021-06-11 MED ORDER — METOPROLOL TARTRATE 25 MG PO TABS
25.0000 mg | ORAL_TABLET | Freq: Once | ORAL | Status: AC
  Administered 2021-06-11: 25 mg via ORAL
  Filled 2021-06-11: qty 1

## 2021-06-11 NOTE — ED Notes (Signed)
Pt was able to drink ginger-ale and eat some crackers without any complaints. ?

## 2021-06-11 NOTE — ED Triage Notes (Signed)
Pt presents from home for epigastric pain (10/10), stabbing. Was seen yesterday for same, given pain medications and discharged after nonemergent CT results.  ?Pt receives HD MWF, no missed treatments.  ?Fever last night of 100F, "some" SOB, N/V (yellow vomit), difficulty keeping down fluids.  ?Last BM yesterday, loose.  ? ?

## 2021-06-11 NOTE — ED Provider Notes (Signed)
?[facility] EMERGENCY DEPT ?Provider Note ? ? ?CSN: [id number] ?Arrival date & time: 06/11/21  1854 ? ?  ? ?History ? ?Chief Complaint  ?Patient presents with  ? Abdominal Pain  ? ? ?[name] is a 36 y.o. female. ? ? ?Abdominal Pain ? ?Patient is a 36 year old dialysis patient receives Monday Wednesday Friday dialysis presents emergency room today with complaints of generalized abdominal pain.  She states this been going on for 1 week although my review of EMR it appears that she is complaining of abdominal pain since 3/19--per 3/20 note--she was also seen 3/21 for headache and yesterday for abdominal pain. ? ?Patient states that she feels that her symptoms came on after she "had too much fluid taken off "during dialysis 1 week ago.  She states that her last dialysis session was yesterday she states she had a full dialysis session.  She states her dry weight is around 78 kg but she was dialyzed to 76 kg.  I reviewed EMR and this is identical to the story that she told providers yesterday 3/29.  She has not been taking her prescribed gabapentin because of abdominal pain and she is also prescribed Percocet.  She has not been taking.  No fevers or chills.  She is no longer makes urine. ? ?She states that she has had multiple episodes of nausea and vomiting she had a loose bowel movement yesterday with no blood in her stool and no pain or defecation.  She endorses fever yesterday but states it was 100.0 ?  ? ?Home Medications ?Prior to Admission medications   ?Medication Sig Start Date End Date Taking? Authorizing Provider  ?ACCU-CHEK SOFTCLIX LANCETS lancets Use to check blood sugar 4 times per day. 12/29/15   Renato Shin, MD  ?acetaminophen (TYLENOL) 500 MG tablet Take 500-1,000 mg by mouth every 6 (six) hours as needed for moderate pain.    [provider]  ?calcium acetate (PHOSLO) 667 MG capsule Take 1,334 mg by mouth 3 (three) times daily with meals. Take 1-2 capsules (518-8416 mg) by  mouth with snacks & take 2 capsules (1334 mg) by mouth with each meal 10/26/20   [provider]  ?Calcium Carbonate Antacid (CALCIUM CARBONATE, DOSED IN MG ELEMENTAL CALCIUM,) 1250 MG/5ML SUSP Take 500 mg of elemental calcium by mouth daily. 10/22/20   [provider]  ?cetirizine (ZYRTEC) 10 MG tablet Take 10 mg by mouth daily.    [provider]  ?famotidine (PEPCID) 40 MG tablet TAKE 1 TABLET BY MOUTH TWICE DAILY IN THE MORNING AND AT BEDTIME 03/17/21   Gatha Mayer, MD  ?fluticasone [facility]) 50 MCG/ACT nasal spray Place 2 sprays into both nostrils daily as needed for allergies. 09/03/18   [name], MD  ?gabapentin (NEURONTIN) 400 MG capsule Take 800-1,200 mg by mouth at bedtime. 12/30/20   [provider]  ?hydrOXYzine (ATARAX/VISTARIL) 50 MG tablet Take 50 mg by mouth 2 (two) times daily as needed for anxiety. 11/03/20   [provider]  ?insulin aspart (NOVOLOG FLEXPEN) 100 UNIT/ML FlexPen Inject 0-15 Units into the skin in the morning, at noon, in the evening, and at bedtime. Sliding Scale insulin    [provider]  ?insulin degludec (TRESIBA) 100 UNIT/ML FlexTouch Pen Inject 20 Units into the skin daily. ?Patient taking differently: Inject 20 Units into the skin daily. 02/15/20   Dwyane Dee, MD  ?Insulin Pen Needle (BD PEN NEEDLE NANO 2ND GEN) 32G X 4 MM MISC 1 each by Does not  apply route in the morning, at noon, in the evening, and at bedtime. 08/10/20   Barb Merino, MD  ?lidocaine (XYLOCAINE) 2 % solution Use as directed 15 mLs in the mouth or throat every 6 (six) hours as needed for mouth pain. 11/11/20   Fatima Blank, MD  ?lidocaine-prilocaine (EMLA) cream Apply 1 application topically daily as needed [facility]). 12/17/20   [provider]  ?loperamide (IMODIUM) 2 MG capsule Take 2 mg by mouth as needed for diarrhea or loose stools. 09/11/20   [provider]  ?metoCLOPramide (REGLAN) 10 MG tablet Take 1 tablet (10 mg  total) by mouth 4 (four) times daily -  before meals and at bedtime. 03/17/21   Gatha Mayer, MD  ?metoCLOPramide (REGLAN) 10 MG tablet Take 1 tablet (10 mg total) by mouth every 6 (six) hours. 06/10/21   Roemhildt, Lorin T, PA-C  ?metoprolol tartrate (LOPRESSOR) 25 MG tablet Take 1 tablet (25 mg total) by mouth 2 (two) times daily. ?Patient taking differently: Take 25 mg by mouth in the morning. 02/15/20   Dwyane Dee, MD  ?omeprazole (PRILOSEC) 40 MG capsule Take 1 capsule (40 mg total) by mouth 2 (two) times daily before a meal. Open capsule and place granules in water or applesauce to swallow 03/17/21   Gatha Mayer, MD  ?ondansetron (ZOFRAN-ODT) 4 MG disintegrating tablet Take 4 mg by mouth every 8 (eight) hours as needed for vomiting or nausea. 11/04/20   [provider]  ?ondansetron (ZOFRAN-ODT) 8 MG disintegrating tablet Take 1 tablet (8 mg total) by mouth every 8 (eight) hours as needed for nausea or vomiting. 06/01/21   Dorie Rank, MD  ?oxyCODONE-acetaminophen (PERCOCET/ROXICET) 5-325 MG tablet Take 0.25 tablets by mouth 2 (two) times daily as needed (leg pain). 02/20/21   [provider]  ?oxyCODONE-acetaminophen (PERCOCET/ROXICET) 5-325 MG tablet Take 1 tablet by mouth every 6 (six) hours as needed for severe pain. 06/01/21   Dorie Rank, MD  ?silver sulfADIAZINE (SILVADENE) 1 % cream Apply 1 application topically daily. 02/06/21   Henderly, Britni A, PA-C  ?simvastatin (ZOCOR) 20 MG tablet Take 20 mg by mouth at bedtime.    [provider]  ?   ? ?Allergies    ?Diphenhydramine, Motrin [ibuprofen], Contrast media [iodinated contrast media], Shellfish allergy, Banana, Chlorhexidine, Ferrous sulfate, and Iron dextran   ? ?Review of Systems   ?Review of Systems  ?Gastrointestinal:  Positive for abdominal pain.  ? ?Physical Exam ?Updated Vital Signs ?BP (!) 152/91 (BP Location: Right Arm)   Pulse 93   Temp 98.4 ?F (36.9 ?C)   Resp 16   Wt 78 kg   LMP  (LMP Unknown)   SpO2 96%    BMI 27.75 kg/m?  ?Physical Exam ?Vitals and nursing note reviewed.  ?Constitutional:   ?   General: She is in acute distress.  ?   Appearance: She is ill-appearing (Chronically ill-appearing).  ?   Comments: Nontoxic chronically ill-appearing 36 year old female.  Appears acutely uncomfortable.  ?HENT:  ?   Head: Normocephalic and atraumatic.  ?   Nose: Nose normal.  ?Eyes:  ?   General: No scleral icterus. ?Cardiovascular:  ?   Rate and Rhythm: Normal rate and regular rhythm.  ?   Pulses: Normal pulses.  ?   Heart sounds: Normal heart sounds.  ?Pulmonary:  ?   Effort: Pulmonary effort is normal. No respiratory distress.  ?   Breath sounds: No wheezing.  ?Abdominal:  ?   Palpations: Abdomen is  soft.  ?   Tenderness: There is abdominal tenderness.  ?   Comments: Generalized nonfocal abdominal tenderness no guarding or rebound.  ?Musculoskeletal:  ?   Cervical back: Normal range of motion.  ?   Right lower leg: No edema.  ?   Left lower leg: No edema.  ?Skin: ?   General: Skin is warm and dry.  ?   Capillary Refill: Capillary refill takes less than 2 seconds.  ?   Comments: Numerous remote old scars to abdomen  ?Neurological:  ?   Mental Status: She is alert. Mental status is at baseline.  ?Psychiatric:     ?   Mood and Affect: Mood normal.     ?   Behavior: Behavior normal.  ? ? ?ED Results / Procedures / Treatments   ?Labs ?(all labs ordered are listed, but only abnormal results are displayed) ?Labs Reviewed  ?COMPREHENSIVE METABOLIC PANEL - Abnormal; Notable for the following components:  ?    Result Value  ? Potassium 3.3 (*)   ? Chloride 97 (*)   ? Glucose, Bld 199 (*)   ? Creatinine, Ser 8.25 (*)   ? Total Protein 8.4 (*)   ? AST 14 (*)   ? GFR, Estimated 6 (*)   ? All other components within normal limits  ?CBC WITH DIFFERENTIAL/PLATELET - Abnormal; Notable for the following components:  ? Hemoglobin 11.5 (*)   ? RDW 17.4 (*)   ? All other components within normal limits  ?CULTURE, BLOOD (SINGLE)  ?LACTIC  ACID, PLASMA  ?HCG, SERUM, QUALITATIVE  ?PROTIME-INR  ? ? ?EKG ?None ? ?Radiology ?CT ABDOMEN PELVIS WO CONTRAST ? ?Result Date: 06/10/2021 ?CLINICAL DATA:  Abdominal pain, nausea, vomiting EXAM: CT ABDOMEN AND

## 2021-06-11 NOTE — Discharge Instructions (Signed)
Please follow-up with your gastroenterologist.  Please use Zofran if you are actively vomiting otherwise use Reglan.  Drink plenty of water and continue to go to your dialysis appointments. ?

## 2021-06-12 ENCOUNTER — Other Ambulatory Visit: Payer: Self-pay

## 2021-06-12 ENCOUNTER — Encounter (HOSPITAL_COMMUNITY): Payer: Self-pay | Admitting: Emergency Medicine

## 2021-06-12 ENCOUNTER — Emergency Department (HOSPITAL_COMMUNITY)
Admission: EM | Admit: 2021-06-12 | Discharge: 2021-06-13 | Discharge status: Against Medical Advice | Payer: Medicare Other | Attending: Emergency Medicine | Admitting: Emergency Medicine

## 2021-06-12 DIAGNOSIS — D631 Anemia in chronic kidney disease: Secondary | ICD-10-CM | POA: Insufficient documentation

## 2021-06-12 DIAGNOSIS — Z992 Dependence on renal dialysis: Secondary | ICD-10-CM | POA: Insufficient documentation

## 2021-06-12 DIAGNOSIS — I120 Hypertensive chronic kidney disease with stage 5 chronic kidney disease or end stage renal disease: Secondary | ICD-10-CM | POA: Diagnosis not present

## 2021-06-12 DIAGNOSIS — R1084 Generalized abdominal pain: Secondary | ICD-10-CM | POA: Insufficient documentation

## 2021-06-12 DIAGNOSIS — E876 Hypokalemia: Secondary | ICD-10-CM | POA: Diagnosis not present

## 2021-06-12 DIAGNOSIS — Z79899 Other long term (current) drug therapy: Secondary | ICD-10-CM | POA: Insufficient documentation

## 2021-06-12 DIAGNOSIS — Z794 Long term (current) use of insulin: Secondary | ICD-10-CM | POA: Diagnosis not present

## 2021-06-12 DIAGNOSIS — E1122 Type 2 diabetes mellitus with diabetic chronic kidney disease: Secondary | ICD-10-CM | POA: Diagnosis not present

## 2021-06-12 DIAGNOSIS — N186 End stage renal disease: Secondary | ICD-10-CM | POA: Insufficient documentation

## 2021-06-12 DIAGNOSIS — D696 Thrombocytopenia, unspecified: Secondary | ICD-10-CM | POA: Insufficient documentation

## 2021-06-12 DIAGNOSIS — R1030 Lower abdominal pain, unspecified: Secondary | ICD-10-CM

## 2021-06-12 LAB — CBC
HCT: 36.8 % (ref 36.0–46.0)
Hemoglobin: 11.6 g/dL — ABNORMAL LOW (ref 12.0–15.0)
MCH: 28.9 pg (ref 26.0–34.0)
MCHC: 31.5 g/dL (ref 30.0–36.0)
MCV: 91.8 fL (ref 80.0–100.0)
Platelets: 146 10*3/uL — ABNORMAL LOW (ref 150–400)
RBC: 4.01 MIL/uL (ref 3.87–5.11)
RDW: 17.2 % — ABNORMAL HIGH (ref 11.5–15.5)
WBC: 4.7 10*3/uL (ref 4.0–10.5)
nRBC: 0 % (ref 0.0–0.2)

## 2021-06-12 LAB — COMPREHENSIVE METABOLIC PANEL
ALT: 22 U/L (ref 0–44)
AST: 31 U/L (ref 15–41)
Albumin: 3.8 g/dL (ref 3.5–5.0)
Alkaline Phosphatase: 54 U/L (ref 38–126)
Anion gap: 9 (ref 5–15)
BUN: 9 mg/dL (ref 6–20)
CO2: 27 mmol/L (ref 22–32)
Calcium: 9.2 mg/dL (ref 8.9–10.3)
Chloride: 100 mmol/L (ref 98–111)
Creatinine, Ser: 5.33 mg/dL — ABNORMAL HIGH (ref 0.44–1.00)
GFR, Estimated: 10 mL/min — ABNORMAL LOW (ref >60)
Glucose, Bld: 175 mg/dL — ABNORMAL HIGH (ref 70–99)
Potassium: 3.4 mmol/L — ABNORMAL LOW (ref 3.5–5.1)
Sodium: 136 mmol/L (ref 135–145)
Total Bilirubin: 0.7 mg/dL (ref 0.3–1.2)
Total Protein: 7.5 g/dL (ref 6.5–8.1)

## 2021-06-12 LAB — LIPASE, BLOOD: Lipase: 43 U/L (ref 11–51)

## 2021-06-12 LAB — I-STAT BETA HCG BLOOD, ED (MC, WL, AP ONLY): I-stat hCG, quantitative: 9.8 m[IU]/mL — ABNORMAL HIGH (ref <5)

## 2021-06-12 NOTE — ED Triage Notes (Signed)
Pt c/o recurrent generalized abdominal pain that started again this AM.  ?

## 2021-06-13 ENCOUNTER — Emergency Department (HOSPITAL_COMMUNITY): Payer: Medicare Other

## 2021-06-13 DIAGNOSIS — R1084 Generalized abdominal pain: Secondary | ICD-10-CM | POA: Diagnosis not present

## 2021-06-13 LAB — LACTIC ACID, PLASMA: Lactic Acid, Venous: 1.1 mmol/L (ref 0.5–1.9)

## 2021-06-13 LAB — MAGNESIUM: Magnesium: 2 mg/dL (ref 1.7–2.4)

## 2021-06-13 MED ORDER — FENTANYL CITRATE PF 50 MCG/ML IJ SOSY
50.0000 ug | PREFILLED_SYRINGE | Freq: Once | INTRAMUSCULAR | Status: AC
  Administered 2021-06-13: 50 ug via INTRAVENOUS
  Filled 2021-06-13: qty 1

## 2021-06-13 MED ORDER — ONDANSETRON HCL 4 MG/2ML IJ SOLN
4.0000 mg | Freq: Once | INTRAMUSCULAR | Status: AC
  Administered 2021-06-13: 4 mg via INTRAVENOUS
  Filled 2021-06-13: qty 2

## 2021-06-13 MED ORDER — FAMOTIDINE IN NACL 20-0.9 MG/50ML-% IV SOLN
20.0000 mg | Freq: Once | INTRAVENOUS | Status: AC
  Administered 2021-06-13: 20 mg via INTRAVENOUS
  Filled 2021-06-13: qty 50

## 2021-06-13 MED ORDER — CETIRIZINE HCL 5 MG/5ML PO SOLN
10.0000 mg | Freq: Once | ORAL | Status: AC
  Administered 2021-06-13: 10 mg via ORAL
  Filled 2021-06-13: qty 10

## 2021-06-13 MED ORDER — HYDROCORTISONE SOD SUC (PF) 250 MG IJ SOLR
200.0000 mg | Freq: Once | INTRAMUSCULAR | Status: AC
  Administered 2021-06-13: 200 mg via INTRAVENOUS
  Filled 2021-06-13: qty 200

## 2021-06-13 NOTE — ED Provider Notes (Signed)
?[facility] ?Provider Note ? ? ?CSN: [id number] ?Arrival date & time: 06/12/21  2220 ? ?  ? ?History ? ?Chief Complaint  ?Patient presents with  ? Abdominal Pain  ? ? ?[name] is a 36 y.o. female. ? ?The history is provided by the patient.  ?Abdominal Pain ?She has history of hypertension, diabetes, hyperlipidemia, end-stage renal disease on hemodialysis, XXX syndrome and comes in because of ongoing problems with generalized abdominal pain.  She has been having pain for the last 5 days.  Pain is constant, but worse after dialysis.  She has been taking oxycodone which gives slight, temporary improvement.  There has been nausea and vomiting as well.  She denies any diarrhea.  She has not noticed any improvement in pain following the bowel movement.  It is not affected by eating.  She has had several other ED visits for pain including CT scans which have not shown the cause of her pain.  She does have an appointment with a gastroenterologist on 4/12. ?  ?Home Medications ?Prior to Admission medications   ?Medication Sig Start Date End Date Taking? Authorizing Provider  ?ACCU-CHEK SOFTCLIX LANCETS lancets Use to check blood sugar 4 times per day. 12/29/15   Renato Shin, MD  ?acetaminophen (TYLENOL) 500 MG tablet Take 500-1,000 mg by mouth every 6 (six) hours as needed for moderate pain.    [provider]  ?calcium acetate (PHOSLO) 667 MG capsule Take 1,334 mg by mouth 3 (three) times daily with meals. Take 1-2 capsules (409-8119 mg) by mouth with snacks & take 2 capsules (1334 mg) by mouth with each meal 10/26/20   [provider]  ?Calcium Carbonate Antacid (CALCIUM CARBONATE, DOSED IN MG ELEMENTAL CALCIUM,) 1250 MG/5ML SUSP Take 500 mg of elemental calcium by mouth daily. 10/22/20   [provider]  ?cetirizine (ZYRTEC) 10 MG tablet Take 10 mg by mouth daily.    [provider]  ?famotidine (PEPCID) 40 MG tablet TAKE 1 TABLET BY MOUTH  TWICE DAILY IN THE MORNING AND AT BEDTIME 03/17/21   Gatha Mayer, MD  ?fluticasone [facility]) 50 MCG/ACT nasal spray Place 2 sprays into both nostrils daily as needed for allergies. 09/03/18   [name], MD  ?gabapentin (NEURONTIN) 400 MG capsule Take 800-1,200 mg by mouth at bedtime. 12/30/20   [provider]  ?hydrOXYzine (ATARAX/VISTARIL) 50 MG tablet Take 50 mg by mouth 2 (two) times daily as needed for anxiety. 11/03/20   [provider]  ?insulin aspart (NOVOLOG FLEXPEN) 100 UNIT/ML FlexPen Inject 0-15 Units into the skin in the morning, at noon, in the evening, and at bedtime. Sliding Scale insulin    [provider]  ?insulin degludec (TRESIBA) 100 UNIT/ML FlexTouch Pen Inject 20 Units into the skin daily. ?Patient taking differently: Inject 20 Units into the skin daily. 02/15/20   Dwyane Dee, MD  ?Insulin Pen Needle (BD PEN NEEDLE NANO 2ND GEN) 32G X 4 MM MISC 1 each by Does not apply route in the morning, at noon, in the evening, and at bedtime. 08/10/20   Barb Merino, MD  ?lidocaine (XYLOCAINE) 2 % solution Use as directed 15 mLs in the mouth or throat every 6 (six) hours as needed for mouth pain. 11/11/20   Fatima Blank, MD  ?lidocaine-prilocaine (EMLA) cream Apply 1 application topically daily as needed [facility]). 12/17/20   [provider]  ?loperamide (IMODIUM) 2 MG capsule Take 2 mg by mouth as needed for diarrhea or  loose stools. 09/11/20   [provider]  ?metoCLOPramide (REGLAN) 10 MG tablet Take 1 tablet (10 mg total) by mouth every 6 (six) hours. 06/11/21   Tedd Sias, PA  ?metoprolol tartrate (LOPRESSOR) 25 MG tablet Take 1 tablet (25 mg total) by mouth 2 (two) times daily. ?Patient taking differently: Take 25 mg by mouth in the morning. 02/15/20   Dwyane Dee, MD  ?omeprazole (PRILOSEC) 40 MG capsule Take 1 capsule (40 mg total) by mouth 2 (two) times daily before a meal. Open capsule and place granules in water or applesauce  to swallow 03/17/21   Gatha Mayer, MD  ?ondansetron (ZOFRAN-ODT) 4 MG disintegrating tablet Take 4 mg by mouth every 8 (eight) hours as needed for vomiting or nausea. 11/04/20   [provider]  ?ondansetron (ZOFRAN-ODT) 8 MG disintegrating tablet Take 1 tablet (8 mg total) by mouth every 8 (eight) hours as needed for nausea or vomiting. 06/01/21   Dorie Rank, MD  ?oxyCODONE-acetaminophen (PERCOCET/ROXICET) 5-325 MG tablet Take 0.25 tablets by mouth 2 (two) times daily as needed (leg pain). 02/20/21   [provider]  ?oxyCODONE-acetaminophen (PERCOCET/ROXICET) 5-325 MG tablet Take 1 tablet by mouth every 6 (six) hours as needed for severe pain. 06/01/21   Dorie Rank, MD  ?silver sulfADIAZINE (SILVADENE) 1 % cream Apply 1 application topically daily. 02/06/21   Henderly, Britni A, PA-C  ?simvastatin (ZOCOR) 20 MG tablet Take 20 mg by mouth at bedtime.    [provider]  ?   ? ?Allergies    ?Diphenhydramine, Motrin [ibuprofen], Contrast media [iodinated contrast media], Shellfish allergy, Banana, Chlorhexidine, Ferrous sulfate, and Iron dextran   ? ?Review of Systems   ?Review of Systems  ?Gastrointestinal:  Positive for abdominal pain.  ?All other systems reviewed and are negative. ? ?Physical Exam ?Updated Vital Signs ?BP (!) 171/114   Pulse 87   Temp 98.2 ?F (36.8 ?C) (Oral)   Resp (!) 22   Ht 5\' 6"  (1.676 m)   Wt 78 kg   LMP  (LMP Unknown)   SpO2 100%   BMI 27.75 kg/m?  ?Physical Exam ?Vitals and nursing note reviewed.  ?36 year old female, obviously uncomfortable, but is in no acute distress. Vital signs are significant for elevated blood pressure. Oxygen saturation is 100%, which is normal. ?Head is normocephalic and atraumatic. PERRLA, EOMI. Oropharynx is clear. ?Neck is nontender and supple without adenopathy or JVD. ?Back is nontender and there is no CVA tenderness. ?Lungs are clear without rales, wheezes, or rhonchi. ?Chest is nontender. ?Heart has regular rate and rhythm  without murmur. ?Abdomen is soft, flat, with moderate tenderness diffusely.  There is no localized tenderness.  There is no rebound or guarding.  Peristalsis is hypoactive. ?Extremities have no cyanosis or edema, full range of motion is present.  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 ?Skin is warm and dry without rash. ?Neurologic: Mental status is normal, cranial nerves are intact, moves all extremities equally. ? ?ED Results / Procedures / Treatments   ?Labs ?(all labs ordered are listed, but only abnormal results are displayed) ?Labs Reviewed  ?COMPREHENSIVE METABOLIC PANEL - Abnormal; Notable for the following components:  ?    Result Value  ? Potassium 3.4 (*)   ? Glucose, Bld 175 (*)   ? Creatinine, Ser 5.33 (*)   ? GFR, Estimated 10 (*)   ? All other components within normal limits  ?CBC - Abnormal; Notable for the following components:  ?  Hemoglobin 11.6 (*)   ? RDW 17.2 (*)   ? Platelets 146 (*)   ? All other components within normal limits  ?I-STAT BETA HCG BLOOD, ED ([facility], [facility], [facility] ONLY) - Abnormal; Notable for the following components:  ? I-stat hCG, quantitative 9.8 (*)   ? All other components within normal limits  ?LIPASE, BLOOD  ?LACTIC ACID, PLASMA  ?MAGNESIUM  ? ? ?EKG ?EKG Interpretation ? ?Date/Time:  Saturday June 13 2021 06:21:54 EDT ?Ventricular Rate:  83 ?PR Interval:  166 ?QRS Duration: 94 ?QT Interval:  423 ?QTC Calculation: 498 ?R Axis:   78 ?Text Interpretation: Sinus rhythm Low voltage, precordial leads Borderline prolonged QT interval When compared with ECG of 06/11/2021, QT has shortened Confirmed by Delora Fuel ([id number]) on 06/13/2021 6:24:21 AM ? ?Radiology ?No results found. ? ?Procedures ?Procedures  ? ? ?Medications Ordered in ED ?Medications  ?ondansetron (ZOFRAN) injection 4 mg (has no administration in time range)  ?hydrocortisone sodium succinate (SOLU-CORTEF) injection 200 mg (has no administration in time range)  ?cetirizine HCl (Zyrtec) 5 MG/5ML solution 10 mg  (has no administration in time range)  ?famotidine (PEPCID) IVPB 20 mg premix (has no administration in time range)  ? ? ?ED Course/ Medical Decision Making/ A&P ?  ?                        ?Medical Decision Vivien Rota

## 2021-06-13 NOTE — ED Provider Notes (Addendum)
?  Physical Exam  ?BP (!) 169/103   Pulse 85   Temp 98.2 ?F (36.8 ?C) (Oral)   Resp 18   Ht 5\' 6"  (1.676 m)   Wt 78 kg   LMP  (LMP Unknown)   SpO2 94%   BMI 27.75 kg/m?  ? ? ? ?Procedures  ?Procedures ? ?ED Course / MDM  ?  ?Medical Decision Making ?Amount and/or Complexity of Data Reviewed ?Labs: ordered. ?Radiology: ordered. ? ?Risk ?Prescription drug management. ? ? ?6F, on HD presenting with multiple ED visits for abdominal pain, worse after dialysis. Has received non-contrasted CT scans before, getting CTA now to rule out mesenteric ischemia. Consider admission for pain control, but reassess. ? ?I was informed by nursing staff that the patient requested to leave. I believe she has capacity to make that decision. Workup pending, patient was informed of the risks associated with leaving [facility] but elected to do so anyway. ? ? ?  ?Regan Lemming, MD ?06/13/21 [phone] ? ?  ?Regan Lemming, MD ?06/13/21 [phone] ? ?

## 2021-06-13 NOTE — ED Notes (Addendum)
Pt called this RN to room to say that she already has a CT scan appointment later in the week, that she has other appointments that she needs to get to today, that she has been here since 9 last night, that no one has done anything for her here, and that she would like to leave; this RN reminded pt that she has been given several medications to relieve her abdominal discomfort throughout there stay, and that I can ask MD for additional pain meds if she would like, and that we are still waiting for her to be taken to her CT scan; pt still insists on leaving, states "i'm just going to have to go to another hospital".  ? ?Dr. Armandina Gemma notified of pt wanting leave; Dr. Armandina Gemma advised that pt is not ready to be discharged, and that if she still insists on leaving that she would be signing out [place] ? ?This RN advised pt that she would be leaving against medical advice, which comes with risks, including death, and that she would need to sign AMA form; this RN encouraged pt to stay for CT scan; pt A and O x 4, verbalized understanding of risks of leaving AMA, still insists on leaving; pt signed AMA form ? ?Pt ambulatory with steady gait to ED lobby to wait for ride home ?

## 2021-06-15 ENCOUNTER — Other Ambulatory Visit: Payer: Self-pay | Admitting: Nephrology

## 2021-06-15 DIAGNOSIS — R109 Unspecified abdominal pain: Secondary | ICD-10-CM

## 2021-06-17 LAB — CULTURE, BLOOD (SINGLE)
Culture: NO GROWTH
Special Requests: ADEQUATE

## 2021-06-23 ENCOUNTER — Encounter: Payer: Self-pay | Admitting: Internal Medicine

## 2021-06-23 ENCOUNTER — Ambulatory Visit (INDEPENDENT_AMBULATORY_CARE_PROVIDER_SITE_OTHER): Payer: Medicare Other | Admitting: Internal Medicine

## 2021-06-23 ENCOUNTER — Ambulatory Visit: Payer: Medicare Other | Admitting: Gastroenterology

## 2021-06-23 VITALS — BP 134/88 | HR 84 | Ht 66.0 in | Wt 172.4 lb

## 2021-06-23 DIAGNOSIS — K208 Other esophagitis without bleeding: Secondary | ICD-10-CM | POA: Diagnosis not present

## 2021-06-23 DIAGNOSIS — K219 Gastro-esophageal reflux disease without esophagitis: Secondary | ICD-10-CM

## 2021-06-23 DIAGNOSIS — R1319 Other dysphagia: Secondary | ICD-10-CM

## 2021-06-23 DIAGNOSIS — K222 Esophageal obstruction: Secondary | ICD-10-CM | POA: Diagnosis not present

## 2021-06-23 NOTE — Progress Notes (Signed)
? ?[name] 36 y.o. September 12, 1985 [id number] ? ?Assessment & Plan:  ? ?Encounter Diagnoses  ?Name Primary?  ? GERD with stricture Yes  ? [place] grade D esophagitis   ? Esophageal dysphagia   ? ?Clinically she seems improved but has persistent issues and is requesting a referral to [facility] for an evaluation.  We will refer for evaluation and treatment of her refractory GERD with stricture.  It has been difficult to treat her due to our critical nursing shortage at the hospital so scheduling of EGD and repeat dilation has been problematic.  However I think now that she seems more compliant with treatment she has improved.  That said, I do think tertiary evaluation will be useful for this problem particularly in the context of her pursuit of a kidney transplant. ? ?She is advised she can use liquid Tylenol if she needs that and seems like she is tolerating her other medications as best she can. ? ?Looking over her chart again there have been some issues with long QT.  Given that I think we will discontinue her metoclopramide.  I did not notice this while she was in the office I called the patient and she will discontinue this medication.  I have taken it off her list. ?CC: [name], [name] ? ? ? ? ?Subjective:  ? ?Chief Complaint: Dysphagia ? ?HPI ?36 year old African-American woman with end-stage renal disease on hemodialysis in search of a kidney transplant (a second 1) who presents for follow-up today.  She was last seen in early January after having an EGD in late December after multiple with a refractory esophagitis and stricture.  She had carried a diagnosis of gastroparesis but I had her repeat a 4-hour gastric emptying study (remotely had a 2-hour I believe) and the gastric emptying was normal.  Her last dilation was with the scope.  See summary below. ? ?Today she reports swallowing most food okay though cannot eat chicken breast or beef/steak.  Her weight is stable over time in fact she has gained  some since January.  She is having difficulty with some pills and is still having some impact dysphagia.  Acetaminophen pills are a major issue and she breaks them into thirds to swallow.  Still with some heartburn as well though that seems better she is opening her omeprazole capsules and ingesting that way.  Remains on metoclopramide twice a day.  She has been in the emergency department some with abdominal pain mostly after dialysis she had an unenhanced CT scan in late March as below. ? ?She is requesting a referral to [facility] for further evaluation and treatment of these problems. ? ?Wt Readings from Last 3 Encounters:  ?06/23/21 172 lb 6.4 oz (78.2 kg)  ?06/12/21 171 lb 15.3 oz (78 kg)  ?06/11/21 171 lb 15.3 oz (78 kg)  ?164# Jan 23 ? ?Summary of recent endoscopy procedures ?36 year old African-American woman with diabetes, ESRD on dialysis, and gastroparesis and a persistent difficult distal esophageal stricture and esophagitis problem.  Recent history pertinent for discovery of a very tight esophageal stricture at January 06, 2021 EGD.  Biopsies showing inflammation.  She has had repeat EGD and dilations on November 7, November 22, November 29, December 8, and March 12, 2021.  We will progressively dilating her though there was a persistent inflammation in the distal esophagus, dilated up to 15 mm by Dr. Lorenso Courier on 02/19/2021.  Repeat EGD on December 29 by Dr. Bryan Lemma showed that the stricture had regressed and deteriorated  and the scope passed with gentle pressure relieving a laceration in the wall of the esophagus so no balloon dilation was undertaken.  ? ?IMPRESSION: ?1. No acute or inflammatory process identified on this noncontrast ?CT of the abdomen or pelvis. ?2. Similar appearance of surgical changes in soft tissue density ?material in the right paracolic gutter and inguinal region, which ?remains poorly evaluated on this noncontrast study. ?3. Interval resolution of previously noted ground-glass  opacities in ?the imaged lungs. ?Allergies  ?Allergen Reactions  ? Diphenhydramine Anaphylaxis  ? Motrin [Ibuprofen] Shortness Of Breath and Itching  ? Contrast Media [Iodinated Contrast Media] Itching  ? Shellfish Allergy Hives  ? Banana Itching, Nausea And Vomiting and Other (See Comments)  ?  Sick on the stomach  ? Chlorhexidine Itching  ? Ferrous Sulfate Itching  ? Iron Dextran Itching and Other (See Comments)  ?  Vein irritation ?  ? ?Current Meds  ?Medication Sig  ? ACCU-CHEK SOFTCLIX LANCETS lancets Use to check blood sugar 4 times per day.  ? acetaminophen (TYLENOL) 500 MG tablet Take 500-1,000 mg by mouth every 6 (six) hours as needed for moderate pain.  ? calcium acetate (PHOSLO) 667 MG capsule Take 1,334 mg by mouth 3 (three) times daily with meals. Take 1-2 capsules ([phone] mg) by mouth with snacks & take 2 capsules (1334 mg) by mouth with each meal  ? Calcium Carbonate Antacid (CALCIUM CARBONATE, DOSED IN MG ELEMENTAL CALCIUM,) 1250 MG/5ML SUSP Take 500 mg of elemental calcium by mouth daily.  ? cetirizine (ZYRTEC) 10 MG tablet Take 10 mg by mouth daily as needed for allergies.  ? famotidine (PEPCID) 40 MG tablet TAKE 1 TABLET BY MOUTH TWICE DAILY IN THE MORNING AND AT BEDTIME (Patient taking differently: Take 40 mg by mouth 2 (two) times daily as needed for heartburn or indigestion.)  ? fluticasone (FLONASE) 50 MCG/ACT nasal spray Place 2 sprays into both nostrils daily as needed for allergies.  ? gabapentin (NEURONTIN) 400 MG capsule Take 800 mg by mouth daily as needed (pain).  ? hydrOXYzine (ATARAX/VISTARIL) 50 MG tablet Take 50 mg by mouth 2 (two) times daily as needed for itching.  ? insulin aspart (NOVOLOG FLEXPEN) 100 UNIT/ML FlexPen Inject 0-15 Units into the skin in the morning, at noon, in the evening, and at bedtime. Sliding Scale insulin  ? insulin degludec (TRESIBA) 100 UNIT/ML FlexTouch Pen Inject 20 Units into the skin daily. (Patient taking differently: Inject 5-10 Units into the skin  daily.)  ? Insulin Pen Needle (BD PEN NEEDLE NANO 2ND GEN) 32G X 4 MM MISC 1 each by Does not apply route in the morning, at noon, in the evening, and at bedtime.  ? lidocaine (XYLOCAINE) 2 % solution Use as directed 15 mLs in the mouth or throat every 6 (six) hours as needed for mouth pain.  ? lidocaine-prilocaine (EMLA) cream Apply 1 application topically daily as needed [facility]).  ? loperamide (IMODIUM) 2 MG capsule Take 2 mg by mouth as needed for diarrhea or loose stools.  ? metoCLOPramide (REGLAN) 10 MG tablet Take 1 tablet (10 mg total) by mouth every 6 (six) hours. (Patient taking differently: Take 10 mg by mouth in the morning and at bedtime.)  ? metoprolol tartrate (LOPRESSOR) 25 MG tablet Take 1 tablet (25 mg total) by mouth 2 (two) times daily. (Patient taking differently: Take 25 mg by mouth in the morning.)  ? omeprazole (PRILOSEC) 40 MG capsule Take 1 capsule (40 mg total) by mouth 2 (two) times daily  before a meal. Open capsule and place granules in water or applesauce to swallow  ? ondansetron (ZOFRAN-ODT) 4 MG disintegrating tablet Take 4 mg by mouth every 8 (eight) hours as needed for vomiting or nausea.  ? ondansetron (ZOFRAN-ODT) 8 MG disintegrating tablet Take 1 tablet (8 mg total) by mouth every 8 (eight) hours as needed for nausea or vomiting.  ? oxyCODONE-acetaminophen (PERCOCET/ROXICET) 5-325 MG tablet Take 0.25 tablets by mouth 2 (two) times daily as needed (leg pain).  ? oxyCODONE-acetaminophen (PERCOCET/ROXICET) 5-325 MG tablet Take 1 tablet by mouth every 6 (six) hours as needed for severe pain. (Patient taking differently: Take 1 tablet by mouth daily as needed for severe pain.)  ? RENVELA 2.4 g PACK Take 2.4 g by mouth 3 (three) times daily.  ? silver sulfADIAZINE (SILVADENE) 1 % cream Apply 1 application topically daily. (Patient taking differently: Apply 1 application. topically daily as needed (wound care).)  ? simvastatin (ZOCOR) 20 MG tablet Take 20 mg by mouth at bedtime.  ? ?Past  Medical History:  ?Diagnosis Date  ? Anemia   ? Blood transfusion without reported diagnosis   ? Chronic kidney disease   ? kidney transplant 07  ? Diabetes mellitus   ? Pt reports diagnosis in June 20

## 2021-06-23 NOTE — Patient Instructions (Signed)
We will work on a [facility] Referral. ? ?If you are age 38 or older, your body mass index should be between 23-30. Your Body mass index is 27.83 kg/m?Marland Kitchen If this is out of the aforementioned range listed, please consider follow up with your Primary Care Provider. ? ?If you are age 55 or younger, your body mass index should be between 19-25. Your Body mass index is 27.83 kg/m?Marland Kitchen If this is out of the aformentioned range listed, please consider follow up with your Primary Care Provider.  ? ?________________________________________________________ ? ?The [facility] providers would like to encourage you to use [facility] to communicate with providers for non-urgent requests or questions.  Due to long hold times on the telephone, sending your provider a message by [facility] may be a faster and more efficient way to get a response.  Please allow 48 business hours for a response.  Please remember that this is for non-urgent requests.  ?_______________________________________________________ ? ? ?I appreciate the opportunity to care for you. ?Silvano Rusk, MD, [facility] ?

## 2021-06-25 ENCOUNTER — Telehealth: Payer: Self-pay

## 2021-06-25 NOTE — Telephone Encounter (Signed)
Referral sent to [facility] to get her an appointment to evaluate her refractory GERD with stricture. Information faxed to them on 06/24/2021.  ?

## 2021-06-26 ENCOUNTER — Encounter: Payer: Medicare Other | Attending: Physician Assistant | Admitting: Physician Assistant

## 2021-06-26 DIAGNOSIS — Z940 Kidney transplant status: Secondary | ICD-10-CM | POA: Diagnosis not present

## 2021-06-26 DIAGNOSIS — E1165 Type 2 diabetes mellitus with hyperglycemia: Secondary | ICD-10-CM | POA: Diagnosis not present

## 2021-06-26 DIAGNOSIS — I120 Hypertensive chronic kidney disease with stage 5 chronic kidney disease or end stage renal disease: Secondary | ICD-10-CM | POA: Diagnosis not present

## 2021-06-26 DIAGNOSIS — Z992 Dependence on renal dialysis: Secondary | ICD-10-CM | POA: Diagnosis not present

## 2021-06-26 DIAGNOSIS — L97529 Non-pressure chronic ulcer of other part of left foot with unspecified severity: Secondary | ICD-10-CM | POA: Diagnosis not present

## 2021-06-26 DIAGNOSIS — T25322A Burn of third degree of left foot, initial encounter: Secondary | ICD-10-CM | POA: Diagnosis not present

## 2021-06-26 DIAGNOSIS — T25221A Burn of second degree of right foot, initial encounter: Secondary | ICD-10-CM | POA: Diagnosis not present

## 2021-06-26 DIAGNOSIS — E11621 Type 2 diabetes mellitus with foot ulcer: Secondary | ICD-10-CM | POA: Diagnosis not present

## 2021-06-26 DIAGNOSIS — E10621 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: Secondary | ICD-10-CM | POA: Diagnosis present

## 2021-06-26 DIAGNOSIS — X58XXXA Exposure to other specified factors, initial encounter: Secondary | ICD-10-CM | POA: Diagnosis not present

## 2021-06-26 NOTE — Progress Notes (Addendum)
[name] ([id number]) ?Visit Report for 06/26/2021 ?Chief Complaint Document Details ?Patient Name: [name], [name] ?Date of Service: 06/26/2021 2:00 PM ?Medical Record Number: [id number] ?Patient Account Number: [id number] ?Date of Birth/Sex: July 10, 1985 (36 y.o. F) ?Treating RN: Carlene Coria ?Primary Care Provider: [name] Other Clinician: ?Referring Provider: [name] ?Treating Provider/Extender: Jeri Cos ?Weeks in Treatment: 17 ?Information Obtained from: Patient ?Chief Complaint ?Bilateral foot thermal burns ?Electronic Signature(s) ?Signed: 06/26/2021 2:05:15 PM By: Worthy [name] PA-C ?Entered By: Worthy [name] on 06/26/2021 14:05:15 ?[name] ([id number]) ?-------------------------------------------------------------------------------- ?HPI Details ?Patient Name: [name], [name] ?Date of Service: 06/26/2021 2:00 PM ?Medical Record Number: [id number] ?Patient Account Number: [id number] ?Date of Birth/Sex: 12-27-1985 (36 y.o. F) ?Treating RN: Carlene Coria ?Primary Care Provider: [name] Other Clinician: ?Referring Provider: [name] ?Treating Provider/Extender: Jeri Cos ?Weeks in Treatment: 17 ?History of Present Illness ?HPI Description: 02/14/17 on evaluation today patient appears to be doing fairly well all things considered. She tells me that around the middle ?of November she was sleeping close to a space heater when she woke up and sustained a burn to her right first toe. She has a history of diabetes ?which is uncontrolled her last hemoglobin A1c with 11.5 on December 12, 2016. She is status post having had a kidney transplant and this was ?necessitated by apparently a high dose of antibiotics given to her as a child. Overall she has been tolerating the wound fairly well all things ?considered she has been putting antibiotic ointment on the area but otherwise no other treatment. She did go to the ER twice the station left ?without being seen due to the long wait. She  continues to have discomfort rated to be 3-4/10 which is worse with toucher cleansing of the wound. ?02/24/2017 -- she has uncontrolled diabetes mellitus with hyperglycemia and her last hemoglobin A1c was 14%. I have asked her to be more ?careful and see her PCP regarding this. She is also not wearing bilateral compression stockings as recommended before. ?03/18/17 on evaluation today patient appears to be doing very well and in fact her wound appears to be completely healed. She has been tolerating ?the dressing changes without complication. Fortunately she is having no pain. ?*** ?05/26/17 patient seen today for reevaluation concerning her right great toe ulcer. She has previously been evaluated by myself in January through ?the beginning of the year before subsequently being discharged. She was completely healed at that point. Unfortunately patient tells me that she's ?unsure of exactly what happened but this wound has reopened. Upon hearing her story it sounds as if she likely injured this utilizing a pumis ?stone that she uses to work on her calluses. At one point she even asked me if there was a different way that she could potentially work on all of ?the callous and dry skin on her foot other than utilizing the stone. With that being said her story at this point was that she felt that she may have ?burnt her foot specifically the great toe on a space heater that she keeps on the bed stand beside her bed. With that being said I do not think ?that's very likely the toes around and all the surrounding region does not appear to show any signs of thermal injury nor does this ulcer appear to ?really be thermal in nature. It very much looks more like a diabetic foot ulcer. Patient's most recent hemoglobin A1c was 11.2 that was in January ?2019. She has not had this check  since she tells me that her blood sugars run in the "300s". Otherwise not much has really changed since I saw ?her previously. ?06/02/17- She is here  in follow-up evaluation for right great toe ulcer. Plain film x-ray revealed evidence worrisome for osteomyelitis, will order ?MRI; we discussed these findings. She presents to the clinic with feelings of hypoglycemia, she was provided with an orange juice in her glucose ?was 15; despite this being a normal glucose level am not surprised she is having hypoglycemic symptoms. She tolerated debridement. We ?discussed the need for tight glycemic control (her a1c has been 11 in Nov and Jan), offloading/reduced trauma and compliance with medical ?treatment plan. A consult for ID was placed ?06/09/17-She is here in follow-up evaluation for right great toe ulcer. Her MRI is scheduled for tomorrow. The wound is significantly improved with ?granulation tissue encompassing most of the wound, small amount of nonviable tissue close to the nail with uneven coloration of the toenail itself, ?currently no lifting. She has an appointment with podiatry and endocrinology on 4/9; an appointment with Dr. Ola Spurr on 4/11. I prescribed ?Levaquin last week which she has not started. Due to the improvement of the wound with granulation tissue, I cultured the wound after ?debridement and we will hold off on initiating Levaquin. I will reach out to her nephrologist, Dr. Lorrene Reid regarding antibiotic selection. If the MRI ?is negative for osteomyelitis we will cancel her appointment with Dr. Ola Spurr. She states she has been more diligent in maintaining better ?glycemic control with more levels being below 200 then over; she has been encouraged to maintain this for continued wound healing. ?06/16/17-She is here in follow up evaluation for right great toe ulcer. 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 She does have an appointment with Dr. Marland KitchenFitzgerald on 4/11. Culture that was obtained last week grew oxacillin sensitive staph aureus, she was initiated on the Levaquin that was originally ?ordered on 3/21. She admits to taking her loading dose on Monday  4/1 and starting her 250 mg daily dose on 4/2. We will extend the Levaquin ?250 mg daily through her appointment with Dr. Ola Spurr. There continues to be improvement in both measurements and appearance, we will ?transition from [facility] to [facility] She states her glucose levels are consistently less than 200 with fewer times greater than 200. She will ?follow-up next week ?Readmission: ?12/01/17 on evaluation today patient presents for reevaluation due to ulcers on the left foot. She tells me at this point though honestly she is a ?poor historian but she is unsure when these all showed up. She's been tolerating the dressing changes without complication using just a in the ?ointment and a Band-Aid as needed. With that being said she did become somewhat concerned about these and therefore made the appointment ?to come in for further evaluation with [place]. Fortunately there is no evidence of infection at this time. 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 Currently all the wounds on the left. No fevers, chills, nausea, or vomiting noted at this time. Patientos last A1c was 8.3 August ?2019. ?12/08/17 on evaluation today patient actually appears to be doing much better in regard to her wounds in general. In fact the Santyl seems to ?have done very well as far as losing up the necrotic material at this point in time and overall I do feel she's made great progress. One of the areas ?appears to have healed the other three are all doing better. ?12/21/17 patient is a 36 year old woman who is  listed in our record is a type II diabetic although in [place] as a type I diabetic. In any ?case she is on insulin. She has wounds on her left foot including the dorsal left first toe medial left second toe and a thickened eschar on the left ?third toe. We've been using silver collagen. It doesn't appear that she is actually offloading these. She works as a Scientist, water quality at [employer]  in ?[place] ?01/04/18; The areas on her left second and third toe or heel. She still has an open area over the proximal phalanx of the left great toe. been ?using silver collagen ?01/25/18; the patient is missed some ap
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
## 2021-07-09 ENCOUNTER — Encounter: Payer: Medicare Other | Admitting: Internal Medicine

## 2021-07-09 DIAGNOSIS — E11621 Type 2 diabetes mellitus with foot ulcer: Secondary | ICD-10-CM | POA: Diagnosis not present
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
## 2021-07-10 NOTE — Telephone Encounter (Signed)
I spoke with [facility] and her referral is still under review, she said call back next week to check the status. ?

## 2021-07-23 ENCOUNTER — Encounter: Payer: Medicare Other | Attending: Physician Assistant | Admitting: Physician Assistant

## 2021-07-23 DIAGNOSIS — E1051 Type 1 diabetes mellitus with diabetic peripheral angiopathy without gangrene: Secondary | ICD-10-CM | POA: Insufficient documentation

## 2021-07-23 DIAGNOSIS — E1040 Type 1 diabetes mellitus with diabetic neuropathy, unspecified: Secondary | ICD-10-CM | POA: Insufficient documentation

## 2021-07-23 DIAGNOSIS — I120 Hypertensive chronic kidney disease with stage 5 chronic kidney disease or end stage renal disease: Secondary | ICD-10-CM | POA: Insufficient documentation

## 2021-07-23 DIAGNOSIS — I10 Essential (primary) hypertension: Secondary | ICD-10-CM | POA: Insufficient documentation

## 2021-07-23 DIAGNOSIS — E1022 Type 1 diabetes mellitus with diabetic chronic kidney disease: Secondary | ICD-10-CM | POA: Diagnosis not present

## 2021-07-23 DIAGNOSIS — T25322A Burn of third degree of left foot, initial encounter: Secondary | ICD-10-CM | POA: Diagnosis not present

## 2021-07-23 DIAGNOSIS — N186 End stage renal disease: Secondary | ICD-10-CM | POA: Diagnosis not present

## 2021-07-23 DIAGNOSIS — Z794 Long term (current) use of insulin: Secondary | ICD-10-CM | POA: Diagnosis not present

## 2021-07-23 DIAGNOSIS — E10621 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: Secondary | ICD-10-CM | POA: Diagnosis not present

## 2021-07-23 DIAGNOSIS — T25221A Burn of second degree of right foot, initial encounter: Secondary | ICD-10-CM | POA: Diagnosis not present

## 2021-07-23 DIAGNOSIS — Z940 Kidney transplant status: Secondary | ICD-10-CM | POA: Insufficient documentation

## 2021-07-23 DIAGNOSIS — Z992 Dependence on renal dialysis: Secondary | ICD-10-CM | POA: Insufficient documentation

## 2021-07-23 DIAGNOSIS — X58XXXA Exposure to other specified factors, initial encounter: Secondary | ICD-10-CM | POA: Diagnosis not present
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
## 2021-07-30 ENCOUNTER — Encounter (HOSPITAL_BASED_OUTPATIENT_CLINIC_OR_DEPARTMENT_OTHER): Payer: Self-pay | Admitting: Emergency Medicine

## 2021-07-30 ENCOUNTER — Other Ambulatory Visit: Payer: Self-pay

## 2021-07-30 ENCOUNTER — Emergency Department (HOSPITAL_BASED_OUTPATIENT_CLINIC_OR_DEPARTMENT_OTHER)
Admission: EM | Admit: 2021-07-30 | Discharge: 2021-07-30 | Disposition: A | Discharge status: Home / Self Care | Payer: Medicare Other | Attending: Emergency Medicine | Admitting: Emergency Medicine

## 2021-07-30 DIAGNOSIS — Y92410 Unspecified street and highway as the place of occurrence of the external cause: Secondary | ICD-10-CM | POA: Diagnosis not present

## 2021-07-30 DIAGNOSIS — Z992 Dependence on renal dialysis: Secondary | ICD-10-CM | POA: Diagnosis not present

## 2021-07-30 DIAGNOSIS — S161XXA Strain of muscle, fascia and tendon at neck level, initial encounter: Secondary | ICD-10-CM | POA: Insufficient documentation

## 2021-07-30 DIAGNOSIS — N186 End stage renal disease: Secondary | ICD-10-CM | POA: Insufficient documentation

## 2021-07-30 DIAGNOSIS — S169XXA Unspecified injury of muscle, fascia and tendon at neck level, initial encounter: Secondary | ICD-10-CM | POA: Diagnosis present

## 2021-07-30 MED ORDER — ASPERCREME/ALOE 10 % EX CREA: 1.0000 "application " | TOPICAL_CREAM | CUTANEOUS | 0 refills | Status: DC | PRN

## 2021-07-30 MED ORDER — CYCLOBENZAPRINE HCL 10 MG PO TABS: 10.0000 mg | ORAL_TABLET | Freq: Every evening | ORAL | 0 refills | Status: DC | PRN
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
## 2021-07-30 NOTE — ED Notes (Signed)
Patient verbalizes understanding of discharge instructions. Opportunity for questioning and answers were provided. Patient discharged from ED.  °
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
## 2021-07-31 NOTE — Telephone Encounter (Signed)
I have called [facility] and the patient care rep said it still says pending and she is going to check with a supervisor and call me back.

## 2021-08-05 NOTE — Telephone Encounter (Signed)
I spoke with [name] and her referral is ready for scheduling. [name] said for her to call and set up the appointment. I called and spoke to [facility] and gave her the phone number. She will reach out to them. The number is # [phone].

## 2021-08-06 ENCOUNTER — Encounter (HOSPITAL_COMMUNITY): Payer: Self-pay

## 2021-08-06 ENCOUNTER — Encounter (HOSPITAL_COMMUNITY): Payer: Self-pay | Admitting: *Deleted

## 2021-08-06 ENCOUNTER — Emergency Department (HOSPITAL_COMMUNITY)
Admission: EM | Admit: 2021-08-06 | Discharge: 2021-08-06 | Disposition: A | Discharge status: Home / Self Care | Payer: Medicare Other | Attending: Emergency Medicine | Admitting: Emergency Medicine

## 2021-08-06 ENCOUNTER — Other Ambulatory Visit: Payer: Self-pay

## 2021-08-06 DIAGNOSIS — Z794 Long term (current) use of insulin: Secondary | ICD-10-CM | POA: Diagnosis not present

## 2021-08-06 DIAGNOSIS — M62838 Other muscle spasm: Secondary | ICD-10-CM | POA: Insufficient documentation

## 2021-08-06 DIAGNOSIS — E1122 Type 2 diabetes mellitus with diabetic chronic kidney disease: Secondary | ICD-10-CM | POA: Diagnosis not present

## 2021-08-06 DIAGNOSIS — Z79899 Other long term (current) drug therapy: Secondary | ICD-10-CM | POA: Insufficient documentation

## 2021-08-06 DIAGNOSIS — I120 Hypertensive chronic kidney disease with stage 5 chronic kidney disease or end stage renal disease: Secondary | ICD-10-CM | POA: Diagnosis not present

## 2021-08-06 DIAGNOSIS — M25512 Pain in left shoulder: Secondary | ICD-10-CM | POA: Diagnosis present

## 2021-08-06 DIAGNOSIS — N186 End stage renal disease: Secondary | ICD-10-CM | POA: Diagnosis not present

## 2021-08-06 MED ORDER — ORPHENADRINE CITRATE ER 100 MG PO TB12: 100.0000 mg | ORAL_TABLET | Freq: Two times a day (BID) | ORAL | 0 refills | Status: AC

## 2021-08-06 MED ORDER — LIDOCAINE 5 % EX PTCH: 1.0000 | MEDICATED_PATCH | CUTANEOUS | 0 refills | Status: DC
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
## 2021-08-06 NOTE — Discharge Instructions (Addendum)
STOP the Cyclobenzoprine (muscle relaxant from prior visit), start Norflex. Apply Lidoderm as prescribed. Can also apply Voltaren Gel topically.  Follow up with sports medicine or see your primary care provider for referral to physical therapy.
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
## 2021-09-05 ENCOUNTER — Encounter (HOSPITAL_COMMUNITY): Payer: Self-pay

## 2021-09-05 ENCOUNTER — Other Ambulatory Visit: Payer: Self-pay

## 2021-09-05 ENCOUNTER — Encounter (HOSPITAL_COMMUNITY): Payer: Self-pay | Admitting: *Deleted

## 2021-09-05 ENCOUNTER — Emergency Department (HOSPITAL_COMMUNITY)
Admission: EM | Admit: 2021-09-05 | Discharge: 2021-09-05 | Disposition: A | Discharge status: Home / Self Care | Payer: Medicare Other | Attending: Emergency Medicine | Admitting: Emergency Medicine

## 2021-09-05 DIAGNOSIS — Y92002 Bathroom of unspecified non-institutional (private) residence single-family (private) house as the place of occurrence of the external cause: Secondary | ICD-10-CM | POA: Diagnosis not present

## 2021-09-05 DIAGNOSIS — W182XXA Fall in (into) shower or empty bathtub, initial encounter: Secondary | ICD-10-CM | POA: Insufficient documentation

## 2021-09-05 DIAGNOSIS — M546 Pain in thoracic spine: Secondary | ICD-10-CM | POA: Insufficient documentation

## 2021-09-05 MED ORDER — ORPHENADRINE CITRATE ER 100 MG PO TB12
100.0000 mg | ORAL_TABLET | Freq: Two times a day (BID) | ORAL | 0 refills | Status: DC
Start: 2021-09-05 — End: 2021-12-28

## 2021-09-05 NOTE — ED Provider Notes (Signed)
[facility]-EMERGENCY DEPT [facility] Emergency Department Provider Note MRN:  [id number]  Arrival date & time: 09/05/21     Chief Complaint   Back Pain   History of Present Illness   [name] is a 36 y.o. year-old female presents to the ED with chief complaint of mid back pain.  She states that she slipped and fell while in the shower on Sunday of last week.  She states that she hit the middle of her back on the ledge.  She states that she was seen by her PCP and was given some pain medicine for this.  She continues to have some pain and muscle soreness.  She denies bruising.  No reported weakness.  History provided by patient.   Review of Systems  Pertinent review of systems noted in HPI.    Physical Exam   Vitals:   09/05/21 0133  BP: (!) 140/113  Pulse: 92  Resp: 20  Temp: 98.3 F (36.8 C)  SpO2: 100%    CONSTITUTIONAL:  well-appearing, NAD NEURO:  Alert and oriented x 3, CN 3-12 grossly intact EYES:  eyes equal and reactive ENT/NECK:  Supple, no stridor  CARDIO:  appears well-perfused  PULM:  No respiratory distress,  GI/GU:  non-distended,  MSK/SPINE:  No gross deformities, no edema, moves all extremities, no contusion, no hematoma, no step-off, there is some muscle tightness of left rhomboids/mid trap SKIN:  no rash, atraumatic   *Additional and/or pertinent findings included in MDM below  Diagnostic and Interventional Summary    EKG Interpretation  Date/Time:    Ventricular Rate:    PR Interval:    QRS Duration:   QT Interval:    QTC Calculation:   R Axis:     Text Interpretation:         Labs Reviewed - No data to display  No orders to display    Medications - No data to display   Procedures  /  Critical Care Procedures  ED Course and Medical Decision Making  I have reviewed the triage vital signs, the nursing notes, and pertinent available records from the EMR.  Social Determinants Affecting Complexity of Care: Patient has no  clinically significant social determinants affecting this chief complaint..   ED Course:   Patient here with back pain.  Top differential diagnoses include contusion, hematoma, fx, muscle strain. Medical Decision Making Patient here with slip and fall while in the shower several days ago.  Hit her back on the ledge of the shower.  I offered imaging, but patient declined.  She does have some muscle tightness.  Doubt fracture, there is no step-off or deformity.  Risk Prescription drug management.     Consultants: No consultations were needed in caring for this patient.   Treatment and Plan: Emergency department workup does not suggest an emergent condition requiring admission or immediate intervention beyond  what has been performed at this time. The patient is safe for discharge and has  been instructed to return immediately for worsening symptoms, change in  symptoms or any other concerns    Final Clinical Impressions(s) / ED Diagnoses     ICD-10-CM   1. Acute midline thoracic back pain  M54.6       ED Discharge Orders          Ordered    orphenadrine (NORFLEX) 100 MG tablet  2 times daily        06 /24/23 0212  Discharge Instructions Discussed with and Provided to Patient:   Discharge Instructions   None      Roxy Horseman, Cordelia Poche 09/05/21 0216    Pollyann Savoy, MD 09/05/21 [phone]

## 2021-09-28 ENCOUNTER — Emergency Department (HOSPITAL_COMMUNITY)
Admission: EM | Admit: 2021-09-28 | Discharge: 2021-09-28 | Disposition: A | Discharge status: Home / Self Care | Payer: Medicare Other | Attending: Emergency Medicine | Admitting: Emergency Medicine

## 2021-09-28 ENCOUNTER — Other Ambulatory Visit: Payer: Self-pay

## 2021-09-28 ENCOUNTER — Encounter (HOSPITAL_COMMUNITY): Payer: Self-pay | Admitting: Emergency Medicine

## 2021-09-28 ENCOUNTER — Emergency Department (HOSPITAL_COMMUNITY): Payer: Medicare Other

## 2021-09-28 DIAGNOSIS — Z794 Long term (current) use of insulin: Secondary | ICD-10-CM | POA: Diagnosis not present

## 2021-09-28 DIAGNOSIS — N186 End stage renal disease: Secondary | ICD-10-CM

## 2021-09-28 DIAGNOSIS — Z992 Dependence on renal dialysis: Secondary | ICD-10-CM | POA: Diagnosis not present

## 2021-09-28 DIAGNOSIS — Z789 Other specified health status: Secondary | ICD-10-CM

## 2021-09-28 LAB — CBC WITH DIFFERENTIAL/PLATELET
Abs Immature Granulocytes: 0.03 10*3/uL (ref 0.00–0.07)
Basophils Absolute: 0 10*3/uL (ref 0.0–0.1)
Basophils Relative: 0 %
Eosinophils Absolute: 0.3 10*3/uL (ref 0.0–0.5)
Eosinophils Relative: 4 %
HCT: 37.7 % (ref 36.0–46.0)
Hemoglobin: 12.1 g/dL (ref 12.0–15.0)
Immature Granulocytes: 0 %
Lymphocytes Relative: 26 %
Lymphs Abs: 1.8 10*3/uL (ref 0.7–4.0)
MCH: 33.1 pg (ref 26.0–34.0)
MCHC: 32.1 g/dL (ref 30.0–36.0)
MCV: 103 fL — ABNORMAL HIGH (ref 80.0–100.0)
Monocytes Absolute: 0.4 10*3/uL (ref 0.1–1.0)
Monocytes Relative: 6 %
Neutro Abs: 4.3 10*3/uL (ref 1.7–7.7)
Neutrophils Relative %: 64 %
Platelets: 175 10*3/uL (ref 150–400)
RBC: 3.66 MIL/uL — ABNORMAL LOW (ref 3.87–5.11)
RDW: 17.1 % — ABNORMAL HIGH (ref 11.5–15.5)
WBC: 6.9 10*3/uL (ref 4.0–10.5)
nRBC: 0 % (ref 0.0–0.2)

## 2021-09-28 LAB — COMPREHENSIVE METABOLIC PANEL
ALT: 14 U/L (ref 0–44)
AST: 21 U/L (ref 15–41)
Albumin: 4.4 g/dL (ref 3.5–5.0)
Alkaline Phosphatase: 87 U/L (ref 38–126)
Anion gap: 17 — ABNORMAL HIGH (ref 5–15)
BUN: 33 mg/dL — ABNORMAL HIGH (ref 6–20)
CO2: 20 mmol/L — ABNORMAL LOW (ref 22–32)
Calcium: 8.4 mg/dL — ABNORMAL LOW (ref 8.9–10.3)
Chloride: 101 mmol/L (ref 98–111)
Creatinine, Ser: 8.76 mg/dL — ABNORMAL HIGH (ref 0.44–1.00)
GFR, Estimated: 5 mL/min — ABNORMAL LOW (ref >60)
Glucose, Bld: 304 mg/dL — ABNORMAL HIGH (ref 70–99)
Potassium: 4.4 mmol/L (ref 3.5–5.1)
Sodium: 138 mmol/L (ref 135–145)
Total Bilirubin: 0.6 mg/dL (ref 0.3–1.2)
Total Protein: 9.2 g/dL — ABNORMAL HIGH (ref 6.5–8.1)

## 2021-09-28 LAB — I-STAT BETA HCG BLOOD, ED (MC, WL, AP ONLY): I-stat hCG, quantitative: 5.3 m[IU]/mL — ABNORMAL HIGH (ref <5)

## 2021-09-28 LAB — MAGNESIUM: Magnesium: 2.2 mg/dL (ref 1.7–2.4)

## 2021-09-28 LAB — HCG, QUANTITATIVE, PREGNANCY: hCG, Beta Chain, Quant, S: 9 m[IU]/mL — ABNORMAL HIGH (ref <5)

## 2021-09-28 NOTE — Progress Notes (Addendum)
Seen and examined patient at bedside. Discussed case with Dr. Joylene Grapes. Emergent dialysis is NOT indicated for patient today. Plan to discharge today and she will resume HD this Wednesday 7/19 per her routine schedule.  Tobie Poet, NP [facility]

## 2021-09-28 NOTE — Discharge Instructions (Addendum)
Follow-up with your regular dialysis center to get dialysis.  Follow-up with your nephrologist for ongoing care.  Come back to ER as needed if you develop chest pain, difficulty breathing or other new concerning symptom.

## 2021-09-28 NOTE — ED Triage Notes (Signed)
Pt states she went to dialysis this morning and was told to leave after eating while she was told not to and talking rude. Pt states she only received one hour and 20 mins and then was told to leave. Pt still has needs in her left arm.

## 2021-09-28 NOTE — ED Provider Triage Note (Signed)
Emergency Medicine Provider Triage Evaluation Note  [name] , a 36 y.o. female  was evaluated in triage.  Pt complains of dialysis concern. She was at dialysis today and only had one hour. She states that she was asked to leave dialysis today after eating a twix bar and getting in an argument. No recent missed sessions. She still has needs in left arm.  Denies chest pain, shortness of breath, or fluid build up  Review of Systems  Positive:  Negative:   Physical Exam  BP (!) 168/111 (BP Location: Right Arm)   Pulse (!) 102   Temp 98.8 F (37.1 C) (Oral)   Resp 14   SpO2 100%  Gen:   Awake, no distress   Resp:  Normal effort  MSK:   Moves extremities without difficulty  Other:    Medical Decision Making  Medically screening exam initiated at 9:09 AM.  Appropriate orders placed.  [name] was informed that the remainder of the evaluation will be completed by another provider, this initial triage assessment does not replace that evaluation, and the importance of remaining in the ED until their evaluation is complete.     Adolphus Birchwood, [place] 09/28/21 [phone]

## 2021-09-28 NOTE — ED Provider Notes (Signed)
[facility] EMERGENCY DEPARTMENT Provider Note   CSN: [id number] Arrival date & time: 09/28/21  6962     History  Chief Complaint  Patient presents with   Vascular Access Problem    [name] is a 36 y.o. female. Presents for vascular access problem.  Patient reports that while she was at dialysis she got into an argument with staff and left.  She received approximately 1 hour and 20 minutes of her dialysis session before she left.  Per the initial triage RN report, patient was eating while receiving hemodialysis and told not to.  Patient denies any sort chest pain or difficulty breathing feels well at this time medically.  Said that normally her session goes 4 hours.  She has had problems with the staff at her dialysis center previously.  HPI     Home Medications Prior to Admission medications   Medication Sig Start Date End Date Taking? Authorizing Provider  ACCU-CHEK SOFTCLIX LANCETS lancets Use to check blood sugar 4 times per day. 12/29/15   Renato Shin, MD  acetaminophen (TYLENOL) 500 MG tablet Take 500-1,000 mg by mouth every 6 (six) hours as needed for moderate pain.    [provider]  calcium acetate (PHOSLO) 667 MG capsule Take 1,334 mg by mouth 3 (three) times daily with meals. Take 1-2 capsules (952-8413 mg) by mouth with snacks & take 2 capsules (1334 mg) by mouth with each meal 10/26/20   [provider]  Calcium Carbonate Antacid (CALCIUM CARBONATE, DOSED IN MG ELEMENTAL CALCIUM,) 1250 MG/5ML SUSP Take 500 mg of elemental calcium by mouth daily. 10/22/20   [provider]  cetirizine (ZYRTEC) 10 MG tablet Take 10 mg by mouth daily as needed for allergies.    [provider]  famotidine (PEPCID) 40 MG tablet TAKE 1 TABLET BY MOUTH TWICE DAILY IN THE MORNING AND AT BEDTIME Patient taking differently: Take 40 mg by mouth 2 (two) times daily as needed for heartburn or indigestion. 03/17/21   Gatha Mayer, MD   fluticasone (FLONASE) 50 MCG/ACT nasal spray Place 2 sprays into both nostrils daily as needed for allergies. 09/03/18   [name], MD  gabapentin (NEURONTIN) 400 MG capsule Take 800 mg by mouth daily as needed (pain). 12/30/20   [provider]  hydrOXYzine (ATARAX/VISTARIL) 50 MG tablet Take 50 mg by mouth 2 (two) times daily as needed for itching. 11/03/20   [provider]  insulin aspart (NOVOLOG FLEXPEN) 100 UNIT/ML FlexPen Inject 0-15 Units into the skin in the morning, at noon, in the evening, and at bedtime. Sliding Scale insulin    [provider]  insulin degludec (TRESIBA) 100 UNIT/ML FlexTouch Pen Inject 20 Units into the skin daily. Patient taking differently: Inject 5-10 Units into the skin daily. 02/15/20   Dwyane Dee, MD  Insulin Pen Needle (BD PEN NEEDLE NANO 2ND GEN) 32G X 4 MM MISC 1 each by Does not apply route in the morning, at noon, in the evening, and at bedtime. 08/10/20   Barb Merino, MD  lidocaine (LIDODERM) 5 % Place 1 patch onto the skin daily. Remove & Discard patch within 12 hours or as directed by MD 08/06/21   Suella Broad A, PA-C  lidocaine (XYLOCAINE) 2 % solution Use as directed 15 mLs in the mouth or throat every 6 (six) hours as needed for mouth pain. 11/11/20   Fatima Blank, MD  lidocaine-prilocaine (EMLA) cream Apply 1 application topically daily as needed [facility]). 12/17/20  [provider]  loperamide (IMODIUM) 2 MG capsule Take 2 mg by mouth as needed for diarrhea or loose stools. 09/11/20   [provider]  metoprolol tartrate (LOPRESSOR) 25 MG tablet Take 1 tablet (25 mg total) by mouth 2 (two) times daily. Patient taking differently: Take 25 mg by mouth in the morning. 02/15/20   Dwyane Dee, MD  omeprazole (PRILOSEC) 40 MG capsule Take 1 capsule (40 mg total) by mouth 2 (two) times daily before a meal. Open capsule and place granules in water or applesauce to swallow 03/17/21   Gatha Mayer,  MD  ondansetron (ZOFRAN-ODT) 4 MG disintegrating tablet Take 4 mg by mouth every 8 (eight) hours as needed for vomiting or nausea. 11/04/20   [provider]  ondansetron (ZOFRAN-ODT) 8 MG disintegrating tablet Take 1 tablet (8 mg total) by mouth every 8 (eight) hours as needed for nausea or vomiting. 06/01/21   Dorie Rank, MD  orphenadrine (NORFLEX) 100 MG tablet Take 1 tablet (100 mg total) by mouth 2 (two) times daily. 09/05/21   Montine Circle, PA-C  oxyCODONE-acetaminophen (PERCOCET/ROXICET) 5-325 MG tablet Take 0.25 tablets by mouth 2 (two) times daily as needed (leg pain). 02/20/21   [provider]  oxyCODONE-acetaminophen (PERCOCET/ROXICET) 5-325 MG tablet Take 1 tablet by mouth every 6 (six) hours as needed for severe pain. Patient taking differently: Take 1 tablet by mouth daily as needed for severe pain. 06/01/21   Dorie Rank, MD  RENVELA 2.4 g PACK Take 2.4 g by mouth 3 (three) times daily. 05/04/21   [provider]  silver sulfADIAZINE (SILVADENE) 1 % cream Apply 1 application topically daily. Patient taking differently: Apply 1 application. topically daily as needed (wound care). 02/06/21   Henderly, Britni A, PA-C  simvastatin (ZOCOR) 20 MG tablet Take 20 mg by mouth at bedtime.    [provider]  trolamine salicylate (ASPERCREME/ALOE) 10 % cream Apply 1 application. topically as needed for muscle pain. 07/30/21   Blanchie Dessert, MD      Allergies    Diphenhydramine, Motrin [ibuprofen], Contrast media [iodinated contrast media], Shellfish allergy, Banana, Chlorhexidine, Ferrous sulfate, and Iron dextran    Review of Systems   Review of Systems  Constitutional:  Negative for chills and fever.  HENT:  Negative for ear pain and sore throat.   Eyes:  Negative for pain and visual disturbance.  Respiratory:  Negative for cough and shortness of breath.   Cardiovascular:  Negative for chest pain and palpitations.  Gastrointestinal:  Negative for  abdominal pain and vomiting.  Genitourinary:  Negative for dysuria and hematuria.  Musculoskeletal:  Negative for arthralgias and back pain.  Skin:  Negative for color change and rash.  Neurological:  Negative for seizures and syncope.  All other systems reviewed and are negative.   Physical Exam Updated Vital Signs BP (!) 178/132   Pulse 91   Temp 98 F (36.7 C) (Oral)   Resp 18   SpO2 100%  Physical Exam Vitals and nursing note reviewed.  Constitutional:      General: She is not in acute distress.    Appearance: She is well-developed.  HENT:     Head: Normocephalic and atraumatic.  Eyes:     Conjunctiva/sclera: Conjunctivae normal.  Cardiovascular:     Rate and Rhythm: Normal rate and regular rhythm.     Heart sounds: No murmur heard. Pulmonary:     Effort: Pulmonary effort is normal. No respiratory distress.     Breath sounds:  Normal breath sounds.  Abdominal:     Palpations: Abdomen is soft.     Tenderness: There is no abdominal tenderness.  Musculoskeletal:        General: No swelling.     Cervical back: Neck supple.     Comments: LUE: dialysis access still in place  Skin:    General: Skin is warm and dry.     Capillary Refill: Capillary refill takes less than 2 seconds.  Neurological:     Mental Status: She is alert.  Psychiatric:        Mood and Affect: Mood normal.     ED Results / Procedures / Treatments   Labs (all labs ordered are listed, but only abnormal results are displayed) Labs Reviewed  COMPREHENSIVE METABOLIC PANEL - Abnormal; Notable for the following components:      Result Value   CO2 20 (*)    Glucose, Bld 304 (*)    BUN 33 (*)    Creatinine, Ser 8.76 (*)    Calcium 8.4 (*)    Total Protein 9.2 (*)    GFR, Estimated 5 (*)    Anion gap 17 (*)    All other components within normal limits  CBC WITH DIFFERENTIAL/PLATELET - Abnormal; Notable for the following components:   RBC 3.66 (*)    MCV 103.0 (*)    RDW 17.1 (*)    All other  components within normal limits  HCG, QUANTITATIVE, PREGNANCY - Abnormal; Notable for the following components:   hCG, Beta Chain, Quant, S 9 (*)    All other components within normal limits  I-STAT BETA HCG BLOOD, ED ([facility], [facility], [facility] ONLY) - Abnormal; Notable for the following components:   I-stat hCG, quantitative 5.3 (*)    All other components within normal limits  MAGNESIUM    EKG None  Radiology DG Chest 2 View  Result Date: 09/28/2021 CLINICAL DATA:  Dialysis patient Fluid volume over Road EXAM: CHEST - 2 VIEW COMPARISON:  05/30/2021 FINDINGS: Cardiomediastinal silhouette and pulmonary vasculature are within normal limits. Interval improvement in aeration of the lungs. Minimal basilar opacities likely due to atelectasis. IMPRESSION: Minimal bibasilar atelectasis. Electronically Signed   By: Miachel Roux M.D.   On: 09/28/2021 09:36    Procedures Procedures    Medications Ordered in ED Medications - No data to display  ED Course/ Medical Decision Making/ A&P                           Medical Decision Making  36 year old lady presenting to ER due to concern for problems at dialysis earlier today.  Apparently she was asked to leave when she was not compliant with instructions about needing during hemodialysis.  Subsequent argument with dialysis staff.  Patient now is calm and cooperative and pleasant.  Her dialysis access was still in place.  Her basic labs look stable.  No significant electrolyte derangement.  She does not appear volume overloaded on exam and has no hypoxia or tachypnea.  I discussed the case with Ouida Sills nurse practitioner with nephrology who spoke with Dr. Osborne Casco with nephrology.  The NP came and evaluated patient and feels she does not need any further dialysis today, ok to resume her regular dialysis schedule of MWF.  Patient instructed to make sure she gets dialysis on Wednesday.  I further instructed patient to follow-up with her primary care doctor and her  primary nephrologist.  Dialysis tech from inpatient dialysis unit came  down and deaccessed.  Patient remained in stable condition and well-appearing, discharged.  After the discussed management above, the patient was determined to be safe for discharge.  The patient was in agreement with this plan and all questions regarding their care were answered.  ED return precautions were discussed and the patient will return to the ED with any significant worsening of condition.         Final Clinical Impression(s) / ED Diagnoses Final diagnoses:  ESRD (end stage renal disease) ([place])  Problem with vascular access    Rx / DC Orders ED Discharge Orders     None         Lucrezia Starch, MD 09/28/21 1601

## 2021-10-02 ENCOUNTER — Encounter (HOSPITAL_COMMUNITY): Payer: Self-pay | Admitting: Emergency Medicine

## 2021-10-02 ENCOUNTER — Emergency Department (HOSPITAL_COMMUNITY)
Admission: EM | Admit: 2021-10-02 | Discharge: 2021-10-02 | Disposition: A | Discharge status: Home / Self Care | Payer: Medicare Other | Attending: Emergency Medicine | Admitting: Emergency Medicine

## 2021-10-02 ENCOUNTER — Other Ambulatory Visit: Payer: Self-pay

## 2021-10-02 DIAGNOSIS — T8249XA Other complication of vascular dialysis catheter, initial encounter: Secondary | ICD-10-CM | POA: Diagnosis present

## 2021-10-02 DIAGNOSIS — T829XXA Unspecified complication of cardiac and vascular prosthetic device, implant and graft, initial encounter: Secondary | ICD-10-CM

## 2021-10-02 DIAGNOSIS — Y732 Prosthetic and other implants, materials and accessory gastroenterology and urology devices associated with adverse incidents: Secondary | ICD-10-CM | POA: Diagnosis not present

## 2021-10-02 LAB — BASIC METABOLIC PANEL
Anion gap: 13 (ref 5–15)
BUN: 43 mg/dL — ABNORMAL HIGH (ref 6–20)
CO2: 25 mmol/L (ref 22–32)
Calcium: 8 mg/dL — ABNORMAL LOW (ref 8.9–10.3)
Chloride: 99 mmol/L (ref 98–111)
Creatinine, Ser: 9.94 mg/dL — ABNORMAL HIGH (ref 0.44–1.00)
GFR, Estimated: 5 mL/min — ABNORMAL LOW (ref >60)
Glucose, Bld: 240 mg/dL — ABNORMAL HIGH (ref 70–99)
Potassium: 4.8 mmol/L (ref 3.5–5.1)
Sodium: 137 mmol/L (ref 135–145)

## 2021-10-02 LAB — CBC WITH DIFFERENTIAL/PLATELET
Abs Immature Granulocytes: 0.02 10*3/uL (ref 0.00–0.07)
Basophils Absolute: 0.1 10*3/uL (ref 0.0–0.1)
Basophils Relative: 1 %
Eosinophils Absolute: 0.4 10*3/uL (ref 0.0–0.5)
Eosinophils Relative: 6 %
HCT: 33.9 % — ABNORMAL LOW (ref 36.0–46.0)
Hemoglobin: 10.8 g/dL — ABNORMAL LOW (ref 12.0–15.0)
Immature Granulocytes: 0 %
Lymphocytes Relative: 34 %
Lymphs Abs: 2.1 10*3/uL (ref 0.7–4.0)
MCH: 32.9 pg (ref 26.0–34.0)
MCHC: 31.9 g/dL (ref 30.0–36.0)
MCV: 103.4 fL — ABNORMAL HIGH (ref 80.0–100.0)
Monocytes Absolute: 0.6 10*3/uL (ref 0.1–1.0)
Monocytes Relative: 10 %
Neutro Abs: 2.9 10*3/uL (ref 1.7–7.7)
Neutrophils Relative %: 49 %
Platelets: 178 10*3/uL (ref 150–400)
RBC: 3.28 MIL/uL — ABNORMAL LOW (ref 3.87–5.11)
RDW: 16.6 % — ABNORMAL HIGH (ref 11.5–15.5)
WBC: 6 10*3/uL (ref 4.0–10.5)
nRBC: 0 % (ref 0.0–0.2)

## 2021-10-02 NOTE — ED Notes (Signed)
EDP at bedside  

## 2021-10-02 NOTE — Progress Notes (Signed)
Pt has come to the ER today because she was kicked out of her HD unit for threatening behavior.  She tells me her side of the story today.  It sounds like she would do well at another  unit I hope.  She is desirous to go to the [facility] unit even though she does not live close to there-  she drives.  I was able to get her a spot there tomorrow AM and she will continue on a TTS schedule   Louis Meckel

## 2021-10-02 NOTE — ED Triage Notes (Signed)
According to a triage note on 09/28/21 at [facility] the pt went to dialysis Monday morning and was told to leave due to being Rude to staff.   Pt states she was last dialyzed on Wednesday but was told not to return to the facility due to behavior. Pt came to [facility] due to the wait and volume in the [facility].  Normal Days are MWF and she has not yet secured a new dialysis center.

## 2021-10-02 NOTE — Discharge Instructions (Signed)
Follow-up with established appointment at dialysis clinic tomorrow

## 2021-10-02 NOTE — ED Notes (Signed)
Pt to follow up with dialysis tomorrow. No questions or concerns at this time.

## 2021-10-02 NOTE — ED Provider Notes (Signed)
[facility] EMERGENCY DEPARTMENT Provider Note   CSN: [id number] Arrival date & time: 10/02/21  7591     History  No chief complaint on file.   [name] is a 36 y.o. female.  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  Patient normally receives dialysis Monday, Wednesday, Friday.  Was able to get dialysis this past Wednesday.  Patient states she was fired from her dialysis center at [facility].  States she has not yet secured a new dialysis center.  Is due for dialysis today.  Is otherwise symptomatic with no shortness of breath, lightheadedness, dizziness or chest pain.  The history is provided by the patient. No language interpreter was used.       Home Medications Prior to Admission medications   Medication Sig Start Date End Date Taking? Authorizing Provider  ACCU-CHEK SOFTCLIX LANCETS lancets Use to check blood sugar 4 times per day. 12/29/15   Renato Shin, MD  acetaminophen (TYLENOL) 500 MG tablet Take 500-1,000 mg by mouth every 6 (six) hours as needed for moderate pain.    [provider]  calcium acetate (PHOSLO) 667 MG capsule Take 1,334 mg by mouth 3 (three) times daily with meals. Take 1-2 capsules (638-4665 mg) by mouth with snacks & take 2 capsules (1334 mg) by mouth with each meal 10/26/20   [provider]  Calcium Carbonate Antacid (CALCIUM CARBONATE, DOSED IN MG ELEMENTAL CALCIUM,) 1250 MG/5ML SUSP Take 500 mg of elemental calcium by mouth daily. 10/22/20   [provider]  cetirizine (ZYRTEC) 10 MG tablet Take 10 mg by mouth daily as needed for allergies.    [provider]  famotidine (PEPCID) 40 MG tablet TAKE 1 TABLET BY MOUTH TWICE DAILY IN THE MORNING AND AT BEDTIME Patient taking differently: Take 40 mg by mouth 2 (two) times daily as needed for heartburn or indigestion. 03/17/21   Gatha Mayer, MD  fluticasone (FLONASE) 50 MCG/ACT nasal  spray Place 2 sprays into both nostrils daily as needed for allergies. 09/03/18   [name], MD  gabapentin (NEURONTIN) 400 MG capsule Take 800 mg by mouth daily as needed (pain). 12/30/20   [provider]  hydrOXYzine (ATARAX/VISTARIL) 50 MG tablet Take 50 mg by mouth 2 (two) times daily as needed for itching. 11/03/20   [provider]  insulin aspart (NOVOLOG FLEXPEN) 100 UNIT/ML FlexPen Inject 0-15 Units into the skin in the morning, at noon, in the evening, and at bedtime. Sliding Scale insulin    [provider]  insulin degludec (TRESIBA) 100 UNIT/ML FlexTouch Pen Inject 20 Units into the skin daily. Patient taking differently: Inject 5-10 Units into the skin daily. 02/15/20   Dwyane Dee, MD  Insulin Pen Needle (BD PEN NEEDLE NANO 2ND GEN) 32G X 4 MM MISC 1 each by Does not apply route in the morning, at noon, in the evening, and at bedtime. 08/10/20   Barb Merino, MD  lidocaine (LIDODERM) 5 % Place 1 patch onto the skin daily. Remove & Discard patch within 12 hours or as directed by MD 08/06/21   Suella Broad A, PA-C  lidocaine (XYLOCAINE) 2 % solution Use as directed 15 mLs in the mouth or throat every 6 (six) hours as needed for mouth pain. 11/11/20   Fatima Blank, MD  lidocaine-prilocaine (EMLA) cream Apply 1 application topically daily as needed [facility]). 12/17/20   [provider]  loperamide (IMODIUM) 2 MG capsule Take 2 mg by mouth as needed for diarrhea or loose stools. 09/11/20   [provider]  metoprolol tartrate (LOPRESSOR) 25 MG tablet Take 1 tablet (25 mg total) by mouth 2 (two) times daily. Patient taking differently: Take 25 mg by mouth in the morning. 02/15/20   Dwyane Dee, MD  omeprazole (PRILOSEC) 40 MG capsule Take 1 capsule (40 mg total) by mouth 2 (two) times daily before a meal. Open capsule and place granules in water or applesauce to swallow 03/17/21   Gatha Mayer, MD  ondansetron (ZOFRAN-ODT) 4 MG  disintegrating tablet Take 4 mg by mouth every 8 (eight) hours as needed for vomiting or nausea. 11/04/20   [provider]  ondansetron (ZOFRAN-ODT) 8 MG disintegrating tablet Take 1 tablet (8 mg total) by mouth every 8 (eight) hours as needed for nausea or vomiting. 06/01/21   Dorie Rank, MD  orphenadrine (NORFLEX) 100 MG tablet Take 1 tablet (100 mg total) by mouth 2 (two) times daily. 09/05/21   Montine Circle, PA-C  oxyCODONE-acetaminophen (PERCOCET/ROXICET) 5-325 MG tablet Take 0.25 tablets by mouth 2 (two) times daily as needed (leg pain). 02/20/21   [provider]  oxyCODONE-acetaminophen (PERCOCET/ROXICET) 5-325 MG tablet Take 1 tablet by mouth every 6 (six) hours as needed for severe pain. Patient taking differently: Take 1 tablet by mouth daily as needed for severe pain. 06/01/21   Dorie Rank, MD  RENVELA 2.4 g PACK Take 2.4 g by mouth 3 (three) times daily. 05/04/21   [provider]  silver sulfADIAZINE (SILVADENE) 1 % cream Apply 1 application topically daily. Patient taking differently: Apply 1 application. topically daily as needed (wound care). 02/06/21   Henderly, Britni A, PA-C  simvastatin (ZOCOR) 20 MG tablet Take 20 mg by mouth at bedtime.    [provider]  trolamine salicylate (ASPERCREME/ALOE) 10 % cream Apply 1 application. topically as needed for muscle pain. 07/30/21   Blanchie Dessert, MD      Allergies    Diphenhydramine, Motrin [ibuprofen], Contrast media [iodinated contrast media], Shellfish allergy, Banana, Chlorhexidine, Ferrous sulfate, and Iron dextran    Review of Systems   Review of Systems  Constitutional:  Negative for chills and fever.  HENT:  Negative for ear pain and sore throat.   Eyes:  Negative for pain and visual disturbance.  Respiratory:  Negative for cough and shortness of breath.   Cardiovascular:  Negative for chest pain and palpitations.  Gastrointestinal:  Negative for abdominal pain and vomiting.   Genitourinary:  Negative for dysuria and hematuria.  Musculoskeletal:  Negative for arthralgias and back pain.  Skin:  Negative for color change and rash.  Neurological:  Negative for seizures and syncope.  All other systems reviewed and are negative.   Physical Exam Updated Vital Signs BP (!) 143/102   Pulse 93   Temp 98.6 F (37 C) (Oral)   Resp 16   Ht 5\' 6"  (1.676 m)   Wt 78 kg   LMP  (LMP Unknown)   SpO2 97%   BMI 27.76 kg/m  Physical Exam Vitals and nursing note reviewed.  Constitutional:      General: She is not in acute distress.    Appearance: She is well-developed.  HENT:     Head: Normocephalic and atraumatic.  Eyes:     Conjunctiva/sclera: Conjunctivae normal.  Cardiovascular:     Rate and Rhythm: Normal rate and regular rhythm.     Heart sounds: No murmur heard.  Pulmonary:     Effort: Pulmonary effort is normal. No respiratory distress.     Breath sounds: Normal breath sounds.  Abdominal:     Palpations: Abdomen is soft.     Tenderness: There is no abdominal tenderness.  Musculoskeletal:        General: No swelling.     Cervical back: Neck supple.  Skin:    General: Skin is warm and dry.     Capillary Refill: Capillary refill takes less than 2 seconds.       Neurological:     Mental Status: She is alert.  Psychiatric:        Mood and Affect: Mood normal.     ED Results / Procedures / Treatments   Labs (all labs ordered are listed, but only abnormal results are displayed) Labs Reviewed  CBC WITH DIFFERENTIAL/PLATELET  BASIC METABOLIC PANEL    EKG None  Radiology No results found.  Procedures Procedures    Medications Ordered in ED Medications - No data to display  ED Course/ Medical Decision Making/ A&P                           Medical Decision Making Amount and/or Complexity of Data Reviewed Labs: ordered.   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  Patient is alert and oriented x3, no acute distress, afebrile, stable vital signs.  No symptoms of fluid overloaded state.  I spoke with nephrologist Dr.Goldsborough will arrange dialysis today in hospital and have patient sent back to ED for discharge.  Requesting basic labs.  9:24 AM Back from Dr. Moshe Cipro, will to arrange outpatient dialysis clinic appointment tomorrow for dialysis.  Patient has details for location and time.  Stable for discharge.  Patient in no distress and overall condition improved here in the ED. Detailed discussions were had with the patient regarding current findings, and need for close f/u with PCP or on call doctor. The patient has been instructed to return immediately if the symptoms worsen in any way for re-evaluation. Patient verbalized understanding and is in agreement with current care plan. All questions answered prior to discharge.        Final Clinical Impression(s) / ED Diagnoses Final diagnoses:  Dialysis complication, initial encounter    Rx / DC Orders ED Discharge Orders     None         Lianne Cure, DO 81/82/99 [phone]

## 2021-12-17 ENCOUNTER — Other Ambulatory Visit: Payer: Self-pay | Admitting: Nephrology

## 2021-12-17 DIAGNOSIS — Z949 Transplanted organ and tissue status, unspecified: Secondary | ICD-10-CM

## 2021-12-25 ENCOUNTER — Encounter (HOSPITAL_COMMUNITY): Payer: Self-pay | Admitting: Emergency Medicine

## 2021-12-25 ENCOUNTER — Emergency Department (HOSPITAL_COMMUNITY)
Admission: EM | Admit: 2021-12-25 | Discharge: 2021-12-26 | Discharge status: Against Medical Advice | Payer: Medicare Other | Source: Home / Self Care

## 2021-12-25 ENCOUNTER — Other Ambulatory Visit: Payer: Self-pay

## 2021-12-25 DIAGNOSIS — K920 Hematemesis: Secondary | ICD-10-CM | POA: Insufficient documentation

## 2021-12-25 DIAGNOSIS — Z5321 Procedure and treatment not carried out due to patient leaving prior to being seen by health care provider: Secondary | ICD-10-CM | POA: Insufficient documentation

## 2021-12-25 DIAGNOSIS — U071 COVID-19: Secondary | ICD-10-CM | POA: Diagnosis not present

## 2021-12-25 DIAGNOSIS — R519 Headache, unspecified: Secondary | ICD-10-CM | POA: Insufficient documentation

## 2021-12-25 DIAGNOSIS — R109 Unspecified abdominal pain: Secondary | ICD-10-CM | POA: Insufficient documentation

## 2021-12-25 DIAGNOSIS — R1084 Generalized abdominal pain: Secondary | ICD-10-CM | POA: Diagnosis not present

## 2021-12-25 DIAGNOSIS — Z992 Dependence on renal dialysis: Secondary | ICD-10-CM | POA: Insufficient documentation

## 2021-12-25 MED ORDER — ONDANSETRON 4 MG PO TBDP
4.0000 mg | ORAL_TABLET | Freq: Once | ORAL | Status: AC
Start: 2021-12-26 — End: 2021-12-26
  Administered 2021-12-26: 4 mg via ORAL
  Filled 2021-12-25: qty 1

## 2021-12-25 NOTE — ED Triage Notes (Addendum)
Patient reports hematemesis with pain across abdomen and headache this evening , hemodialysis q Tues/Thurs/Sat . Hypertensive at triage .

## 2021-12-25 NOTE — ED Provider Triage Note (Signed)
  Emergency Medicine Provider Triage Evaluation Note  MRN:  [id number]  Arrival date & time: 12/25/21    Medically screening exam initiated at 11:55 PM.   CC:   Hematemesis / Abdominal Pain  (Hypertensive)   HPI:  [name] is a 36 y.o. year-old female presents to the ED with chief complaint of abdominal pain, vomiting.  States that she has had blood in her vomit.  Had dialysis yesterday.  Also reports headache.  History provided by patient. ROS:  -As included in HPI PE:   Vitals:   12/25/21 2349  BP: (!) 196/134  Pulse: (!) 124  Resp: 16  Temp: 97.9 F (36.6 C)  SpO2: 100%    Non-toxic appearing No respiratory distress  MDM:  Undifferentiated abdominal pain. I've ordered labs in triage to expedite lab/diagnostic workup.  Patient was informed that the remainder of the evaluation will be completed by another provider, this initial triage assessment does not replace that evaluation, and the importance of remaining in the ED until their evaluation is complete.    Montine Circle, PA-C 12/25/21 2357

## 2021-12-26 ENCOUNTER — Encounter (HOSPITAL_COMMUNITY): Payer: Self-pay

## 2021-12-26 ENCOUNTER — Inpatient Hospital Stay (HOSPITAL_BASED_OUTPATIENT_CLINIC_OR_DEPARTMENT_OTHER)
Admission: EM | Admit: 2021-12-26 | Discharge: 2022-01-01 | DRG: 177 | Disposition: A | Discharge status: Home / Self Care | Payer: Medicare Other | Attending: Internal Medicine | Admitting: Internal Medicine

## 2021-12-26 ENCOUNTER — Encounter (HOSPITAL_BASED_OUTPATIENT_CLINIC_OR_DEPARTMENT_OTHER): Payer: Self-pay | Admitting: Emergency Medicine

## 2021-12-26 ENCOUNTER — Emergency Department (HOSPITAL_BASED_OUTPATIENT_CLINIC_OR_DEPARTMENT_OTHER): Payer: Medicare Other

## 2021-12-26 DIAGNOSIS — I10 Essential (primary) hypertension: Secondary | ICD-10-CM | POA: Diagnosis present

## 2021-12-26 DIAGNOSIS — D631 Anemia in chronic kidney disease: Secondary | ICD-10-CM | POA: Diagnosis present

## 2021-12-26 DIAGNOSIS — Z8249 Family history of ischemic heart disease and other diseases of the circulatory system: Secondary | ICD-10-CM

## 2021-12-26 DIAGNOSIS — Z91041 Radiographic dye allergy status: Secondary | ICD-10-CM

## 2021-12-26 DIAGNOSIS — E1065 Type 1 diabetes mellitus with hyperglycemia: Secondary | ICD-10-CM | POA: Diagnosis present

## 2021-12-26 DIAGNOSIS — N186 End stage renal disease: Secondary | ICD-10-CM

## 2021-12-26 DIAGNOSIS — G40909 Epilepsy, unspecified, not intractable, without status epilepticus: Secondary | ICD-10-CM | POA: Diagnosis present

## 2021-12-26 DIAGNOSIS — K3189 Other diseases of stomach and duodenum: Secondary | ICD-10-CM | POA: Diagnosis present

## 2021-12-26 DIAGNOSIS — K769 Liver disease, unspecified: Secondary | ICD-10-CM | POA: Diagnosis present

## 2021-12-26 DIAGNOSIS — F819 Developmental disorder of scholastic skills, unspecified: Secondary | ICD-10-CM | POA: Diagnosis present

## 2021-12-26 DIAGNOSIS — K2101 Gastro-esophageal reflux disease with esophagitis, with bleeding: Secondary | ICD-10-CM | POA: Diagnosis present

## 2021-12-26 DIAGNOSIS — Z91013 Allergy to seafood: Secondary | ICD-10-CM

## 2021-12-26 DIAGNOSIS — Z794 Long term (current) use of insulin: Secondary | ICD-10-CM

## 2021-12-26 DIAGNOSIS — Z992 Dependence on renal dialysis: Secondary | ICD-10-CM

## 2021-12-26 DIAGNOSIS — K920 Hematemesis: Secondary | ICD-10-CM

## 2021-12-26 DIAGNOSIS — K449 Diaphragmatic hernia without obstruction or gangrene: Secondary | ICD-10-CM | POA: Diagnosis present

## 2021-12-26 DIAGNOSIS — K3184 Gastroparesis: Secondary | ICD-10-CM | POA: Diagnosis present

## 2021-12-26 DIAGNOSIS — A0839 Other viral enteritis: Secondary | ICD-10-CM | POA: Diagnosis present

## 2021-12-26 DIAGNOSIS — R933 Abnormal findings on diagnostic imaging of other parts of digestive tract: Secondary | ICD-10-CM

## 2021-12-26 DIAGNOSIS — E1043 Type 1 diabetes mellitus with diabetic autonomic (poly)neuropathy: Secondary | ICD-10-CM | POA: Diagnosis present

## 2021-12-26 DIAGNOSIS — R1013 Epigastric pain: Secondary | ICD-10-CM

## 2021-12-26 DIAGNOSIS — Z886 Allergy status to analgesic agent status: Secondary | ICD-10-CM

## 2021-12-26 DIAGNOSIS — K222 Esophageal obstruction: Secondary | ICD-10-CM | POA: Diagnosis present

## 2021-12-26 DIAGNOSIS — I120 Hypertensive chronic kidney disease with stage 5 chronic kidney disease or end stage renal disease: Secondary | ICD-10-CM | POA: Diagnosis present

## 2021-12-26 DIAGNOSIS — Z833 Family history of diabetes mellitus: Secondary | ICD-10-CM

## 2021-12-26 DIAGNOSIS — Z91018 Allergy to other foods: Secondary | ICD-10-CM

## 2021-12-26 DIAGNOSIS — E785 Hyperlipidemia, unspecified: Secondary | ICD-10-CM | POA: Diagnosis present

## 2021-12-26 DIAGNOSIS — Z888 Allergy status to other drugs, medicaments and biological substances status: Secondary | ICD-10-CM

## 2021-12-26 DIAGNOSIS — R112 Nausea with vomiting, unspecified: Secondary | ICD-10-CM

## 2021-12-26 DIAGNOSIS — U071 COVID-19: Principal | ICD-10-CM | POA: Diagnosis present

## 2021-12-26 DIAGNOSIS — R109 Unspecified abdominal pain: Secondary | ICD-10-CM

## 2021-12-26 DIAGNOSIS — N2581 Secondary hyperparathyroidism of renal origin: Secondary | ICD-10-CM | POA: Diagnosis present

## 2021-12-26 DIAGNOSIS — D62 Acute posthemorrhagic anemia: Secondary | ICD-10-CM | POA: Diagnosis not present

## 2021-12-26 DIAGNOSIS — Z79899 Other long term (current) drug therapy: Secondary | ICD-10-CM

## 2021-12-26 DIAGNOSIS — R1010 Upper abdominal pain, unspecified: Secondary | ICD-10-CM

## 2021-12-26 DIAGNOSIS — Z8261 Family history of arthritis: Secondary | ICD-10-CM

## 2021-12-26 DIAGNOSIS — E1022 Type 1 diabetes mellitus with diabetic chronic kidney disease: Secondary | ICD-10-CM | POA: Diagnosis present

## 2021-12-26 DIAGNOSIS — S91302A Unspecified open wound, left foot, initial encounter: Secondary | ICD-10-CM

## 2021-12-26 DIAGNOSIS — K922 Gastrointestinal hemorrhage, unspecified: Secondary | ICD-10-CM | POA: Diagnosis present

## 2021-12-26 LAB — CBC WITH DIFFERENTIAL/PLATELET
Abs Immature Granulocytes: 0.06 10*3/uL (ref 0.00–0.07)
Basophils Absolute: 0 10*3/uL (ref 0.0–0.1)
Basophils Relative: 0 %
Eosinophils Absolute: 0 10*3/uL (ref 0.0–0.5)
Eosinophils Relative: 0 %
HCT: 36.9 % (ref 36.0–46.0)
Hemoglobin: 12.4 g/dL (ref 12.0–15.0)
Immature Granulocytes: 1 %
Lymphocytes Relative: 9 %
Lymphs Abs: 1 10*3/uL (ref 0.7–4.0)
MCH: 33.4 pg (ref 26.0–34.0)
MCHC: 33.6 g/dL (ref 30.0–36.0)
MCV: 99.5 fL (ref 80.0–100.0)
Monocytes Absolute: 0.5 10*3/uL (ref 0.1–1.0)
Monocytes Relative: 5 %
Neutro Abs: 9.8 10*3/uL — ABNORMAL HIGH (ref 1.7–7.7)
Neutrophils Relative %: 85 %
Platelets: 196 10*3/uL (ref 150–400)
RBC: 3.71 MIL/uL — ABNORMAL LOW (ref 3.87–5.11)
RDW: 15.2 % (ref 11.5–15.5)
WBC: 11.4 10*3/uL — ABNORMAL HIGH (ref 4.0–10.5)
nRBC: 0 % (ref 0.0–0.2)

## 2021-12-26 LAB — COMPREHENSIVE METABOLIC PANEL
ALT: 16 U/L (ref 0–44)
ALT: 10 U/L (ref 0–44)
AST: 22 U/L (ref 15–41)
AST: 17 U/L (ref 15–41)
Albumin: 4.3 g/dL (ref 3.5–5.0)
Albumin: 4.7 g/dL (ref 3.5–5.0)
Alkaline Phosphatase: 65 U/L (ref 38–126)
Alkaline Phosphatase: 55 U/L (ref 38–126)
Anion gap: 20 — ABNORMAL HIGH (ref 5–15)
Anion gap: 20 — ABNORMAL HIGH (ref 5–15)
BUN: 43 mg/dL — ABNORMAL HIGH (ref 6–20)
BUN: 24 mg/dL — ABNORMAL HIGH (ref 6–20)
CO2: 28 mmol/L (ref 22–32)
CO2: 27 mmol/L (ref 22–32)
Calcium: 8.8 mg/dL — ABNORMAL LOW (ref 8.9–10.3)
Calcium: 10.2 mg/dL (ref 8.9–10.3)
Chloride: 89 mmol/L — ABNORMAL LOW (ref 98–111)
Chloride: 91 mmol/L — ABNORMAL LOW (ref 98–111)
Creatinine, Ser: 8.98 mg/dL — ABNORMAL HIGH (ref 0.44–1.00)
Creatinine, Ser: 4.95 mg/dL — ABNORMAL HIGH (ref 0.44–1.00)
GFR, Estimated: 5 mL/min — ABNORMAL LOW (ref >60)
GFR, Estimated: 11 mL/min — ABNORMAL LOW (ref >60)
Glucose, Bld: 603 mg/dL (ref 70–99)
Glucose, Bld: 398 mg/dL — ABNORMAL HIGH (ref 70–99)
Potassium: 4.5 mmol/L (ref 3.5–5.1)
Potassium: 3.9 mmol/L (ref 3.5–5.1)
Sodium: 137 mmol/L (ref 135–145)
Sodium: 138 mmol/L (ref 135–145)
Total Bilirubin: 0.9 mg/dL (ref 0.3–1.2)
Total Bilirubin: 0.7 mg/dL (ref 0.3–1.2)
Total Protein: 9.2 g/dL — ABNORMAL HIGH (ref 6.5–8.1)
Total Protein: 9.7 g/dL — ABNORMAL HIGH (ref 6.5–8.1)

## 2021-12-26 LAB — I-STAT BETA HCG BLOOD, ED (MC, WL, AP ONLY): I-stat hCG, quantitative: 7.7 m[IU]/mL — ABNORMAL HIGH (ref <5)

## 2021-12-26 LAB — CBC
HCT: 39.3 % (ref 36.0–46.0)
Hemoglobin: 12.5 g/dL (ref 12.0–15.0)
MCH: 32.6 pg (ref 26.0–34.0)
MCHC: 31.8 g/dL (ref 30.0–36.0)
MCV: 102.3 fL — ABNORMAL HIGH (ref 80.0–100.0)
Platelets: 244 10*3/uL (ref 150–400)
RBC: 3.84 MIL/uL — ABNORMAL LOW (ref 3.87–5.11)
RDW: 15.7 % — ABNORMAL HIGH (ref 11.5–15.5)
WBC: 12.5 10*3/uL — ABNORMAL HIGH (ref 4.0–10.5)
nRBC: 0 % (ref 0.0–0.2)

## 2021-12-26 LAB — DIFFERENTIAL
Abs Immature Granulocytes: 0.12 10*3/uL — ABNORMAL HIGH (ref 0.00–0.07)
Basophils Absolute: 0 10*3/uL (ref 0.0–0.1)
Basophils Relative: 0 %
Eosinophils Absolute: 0 10*3/uL (ref 0.0–0.5)
Eosinophils Relative: 0 %
Immature Granulocytes: 1 %
Lymphocytes Relative: 13 %
Lymphs Abs: 1.6 10*3/uL (ref 0.7–4.0)
Monocytes Absolute: 0.6 10*3/uL (ref 0.1–1.0)
Monocytes Relative: 5 %
Neutro Abs: 10.1 10*3/uL — ABNORMAL HIGH (ref 1.7–7.7)
Neutrophils Relative %: 81 %

## 2021-12-26 LAB — LIPASE, BLOOD
Lipase: 69 U/L — ABNORMAL HIGH (ref 11–51)
Lipase: 47 U/L (ref 11–51)

## 2021-12-26 LAB — PROTIME-INR
INR: 1.2 (ref 0.8–1.2)
Prothrombin Time: 15 seconds (ref 11.4–15.2)

## 2021-12-26 LAB — CBG MONITORING, ED: Glucose-Capillary: 382 mg/dL — ABNORMAL HIGH (ref 70–99)

## 2021-12-26 LAB — OCCULT BLOOD X 1 CARD TO LAB, STOOL: Fecal Occult Bld: POSITIVE — AB

## 2021-12-26 LAB — BETA-HYDROXYBUTYRIC ACID: Beta-Hydroxybutyric Acid: 2.18 mmol/L — ABNORMAL HIGH (ref 0.05–0.27)

## 2021-12-26 MED ORDER — PANTOPRAZOLE INFUSION (NEW) - SIMPLE MED
8.0000 mg/h | INTRAVENOUS | Status: DC
  Administered 2021-12-27 (×3): 8 mg/h via INTRAVENOUS
  Filled 2021-12-26 (×5): qty 100

## 2021-12-26 MED ORDER — SODIUM CHLORIDE 0.9 % IV BOLUS
1000.0000 mL | Freq: Once | INTRAVENOUS | Status: AC
  Administered 2021-12-26: 1000 mL via INTRAVENOUS

## 2021-12-26 MED ORDER — HYDROXYZINE HCL 25 MG PO TABS
50.0000 mg | ORAL_TABLET | Freq: Once | ORAL | Status: AC
  Administered 2021-12-26: 50 mg via ORAL
  Filled 2021-12-26: qty 2

## 2021-12-26 MED ORDER — LACTATED RINGERS IV BOLUS
1000.0000 mL | INTRAVENOUS | Status: AC
  Administered 2021-12-26: 1000 mL via INTRAVENOUS

## 2021-12-26 MED ORDER — PANTOPRAZOLE SODIUM 40 MG IV SOLR: 40.0000 mg | Freq: Two times a day (BID) | INTRAVENOUS | Status: DC

## 2021-12-26 MED ORDER — HYDROMORPHONE HCL 1 MG/ML IJ SOLN
1.0000 mg | Freq: Once | INTRAMUSCULAR | Status: AC
  Administered 2021-12-26: 1 mg via INTRAVENOUS
  Filled 2021-12-26: qty 1

## 2021-12-26 MED ORDER — HYDROMORPHONE HCL 1 MG/ML IJ SOLN
0.5000 mg | Freq: Once | INTRAMUSCULAR | Status: AC
  Administered 2021-12-26: 0.5 mg via INTRAVENOUS
  Filled 2021-12-26: qty 1

## 2021-12-26 MED ORDER — INSULIN ASPART 100 UNIT/ML IJ SOLN
10.0000 [IU] | Freq: Once | INTRAMUSCULAR | Status: AC
  Administered 2021-12-26: 10 [IU] via SUBCUTANEOUS

## 2021-12-26 MED ORDER — DEXTROSE IN LACTATED RINGERS 5 % IV SOLN: INTRAVENOUS | Status: DC

## 2021-12-26 MED ORDER — DEXTROSE 50 % IV SOLN
0.0000 mL | INTRAVENOUS | Status: DC | PRN
  Filled 2021-12-26: qty 50

## 2021-12-26 MED ORDER — PANTOPRAZOLE SODIUM 40 MG IV SOLR
INTRAVENOUS | Status: AC
  Filled 2021-12-26: qty 10

## 2021-12-26 MED ORDER — LACTATED RINGERS IV SOLN: INTRAVENOUS | Status: DC

## 2021-12-26 MED ORDER — PANTOPRAZOLE SODIUM 40 MG IV SOLR
INTRAVENOUS | Status: AC
  Filled 2021-12-26: qty 40

## 2021-12-26 MED ORDER — POTASSIUM CHLORIDE 10 MEQ/100ML IV SOLN
10.0000 meq | INTRAVENOUS | Status: AC
  Administered 2021-12-26 (×2): 10 meq via INTRAVENOUS
  Filled 2021-12-26 (×2): qty 100

## 2021-12-26 MED ORDER — ONDANSETRON HCL 4 MG/2ML IJ SOLN
4.0000 mg | Freq: Once | INTRAMUSCULAR | Status: AC
  Administered 2021-12-26: 4 mg via INTRAVENOUS
  Filled 2021-12-26: qty 2

## 2021-12-26 MED ORDER — INSULIN REGULAR(HUMAN) IN NACL 100-0.9 UT/100ML-% IV SOLN: INTRAVENOUS | Status: DC

## 2021-12-26 MED ORDER — PANTOPRAZOLE 80MG IVPB - SIMPLE MED
80.0000 mg | Freq: Once | INTRAVENOUS | Status: AC
  Administered 2021-12-26: 80 mg via INTRAVENOUS
  Filled 2021-12-26: qty 100

## 2021-12-26 NOTE — ED Provider Notes (Signed)
[facility] DEPT Provider Note  CSN: [id number] Arrival date & time: 12/26/21 1230  Chief Complaint(s) Abdominal Pain  HPI [name] is a 36 y.o. female with history of diabetes, seizure disorder, end-stage renal disease on Tuesday Thursday Saturday dialysis presenting with abdominal pain.  She reports the abdominal pain began Thursday.  She reports the pain is persistent.  She reports associated nausea and vomiting.  She has seen some streaks of blood in her vomit and some dark stools.  She denies any shortness of breath or chest pain.  She reports compliance with dialysis.  She had dialysis this morning.  She denies fevers or chills.  No back pain.  Similar episodes in the past  Past Medical History Past Medical History:  Diagnosis Date   Anemia    Blood transfusion without reported diagnosis    Cellulitis of left leg 03/01/2018   Chronic kidney disease    kidney transplant 07   Diabetes mellitus    Pt reports diagnosis in June 2011, Type 2   Diabetes mellitus without complication ([place])    Esophageal obstruction due to food impaction    GERD (gastroesophageal reflux disease)    Hyperlipidemia    Hypertension    Intra-abdominal abscess ([place]) 10/28/2018   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   LEARNING DISABILITY 09/25/2007   Qualifier: Diagnosis of  By: Deborra Medina MD, Talia     Prolonged Q-T interval on ECG    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   Pyelonephritis 06/23/2014   Renal and perinephric abscess 11/01/2018   Renal disorder    Seasonal allergies    Seizures ([place])    UTI (urinary tract infection) 01/09/2015   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   Qualifier: Diagnosis of  By: Carlena Sax  MD, Colletta Maryland     Patient Active Problem List   Diagnosis Date Noted   Esophageal dysphagia    Esophageal stricture    Seizures ([place]) 10/30/2020   [place] grade D esophagitis 08/09/2020   DKA, type 2 ([place]) 07/31/2020   Polyneuropathy associated with underlying  disease ([place]) 04/29/2020   Renal osteodystrophy 04/22/2020   Hiatal hernia 04/22/2020   Overweight (BMI 25.0-29.9) 04/22/2020   Uncontrolled type 1 diabetes mellitus with hyperglycemia ([place]) 04/20/2020   GERD (gastroesophageal reflux disease) 04/20/2020   Wide-complex tachycardia    ESRD on hemodialysis ([place]) 05/01/2018   Iron deficiency anemia, unspecified 17/40/8144   Complication of vascular dialysis catheter 03/27/2018   Kidney transplant failure 03/27/2018   Renal sclerosis, unspecified 03/27/2018   Hyperlipidemia 81/85/6314   Metabolic acidosis 97/04/6376   Incontinence of bowel 02/03/2018   Chronic pain 11/11/2017   Chronic cholecystitis 06/29/2017   Type 1 diabetes mellitus with complication, uncontrolled 05/25/2015   Renal transplant recipient    Immunosuppressed status ([place])    ESRD (end stage renal disease) ([place]) 09/30/2014   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 Authorizing Provider  ACCU-CHEK SOFTCLIX LANCETS lancets Use to check blood sugar 4 times per day. 12/29/15   Renato Shin, MD  acetaminophen (TYLENOL) 500 MG tablet Take 500-1,000 mg by mouth every 6 (six)  hours as needed for moderate pain.    [provider]  calcium acetate (PHOSLO) 667 MG capsule Take 1,334 mg by mouth 3 (three) times daily with meals. Take 1-2 capsules (403-4742 mg) by mouth with snacks & take 2 capsules (1334 mg) by mouth with each meal 10/26/20   [provider]  Calcium Carbonate Antacid (CALCIUM CARBONATE, DOSED IN  MG ELEMENTAL CALCIUM,) 1250 MG/5ML SUSP Take 500 mg of elemental calcium by mouth daily. 10/22/20   [provider]  cetirizine (ZYRTEC) 10 MG tablet Take 10 mg by mouth daily as needed for allergies.    [provider]  famotidine (PEPCID) 40 MG tablet TAKE 1 TABLET BY MOUTH TWICE DAILY IN THE MORNING AND AT BEDTIME Patient taking differently: Take 40 mg by mouth 2 (two) times daily as needed for heartburn or indigestion. 03/17/21   Gatha Mayer, MD  fluticasone (FLONASE) 50 MCG/ACT nasal spray Place 2 sprays into both nostrils daily as needed for allergies. 09/03/18   [name], MD  gabapentin (NEURONTIN) 400 MG capsule Take 800 mg by mouth daily as needed (pain). 12/30/20   [provider]  hydrOXYzine (ATARAX/VISTARIL) 50 MG tablet Take 50 mg by mouth 2 (two) times daily as needed for itching. 11/03/20   [provider]  insulin aspart (NOVOLOG FLEXPEN) 100 UNIT/ML FlexPen Inject 0-15 Units into the skin in the morning, at noon, in the evening, and at bedtime. Sliding Scale insulin    [provider]  insulin degludec (TRESIBA) 100 UNIT/ML FlexTouch Pen Inject 20 Units into the skin daily. Patient taking differently: Inject 5-10 Units into the skin daily. 02/15/20   Dwyane Dee, MD  Insulin Pen Needle (BD PEN NEEDLE NANO 2ND GEN) 32G X 4 MM MISC 1 each by Does not apply route in the morning, at noon, in the evening, and at bedtime. 08/10/20   Barb Merino, MD  lidocaine (LIDODERM) 5 % Place 1 patch onto the skin daily. Remove & Discard patch within 12 hours or as directed by MD 08/06/21   Suella Broad A, PA-C  lidocaine (XYLOCAINE) 2 % solution Use as directed 15 mLs in the mouth or throat every 6 (six) hours as needed for mouth pain. 11/11/20   Fatima Blank, MD  lidocaine-prilocaine (EMLA) cream Apply 1 application topically daily as needed [facility]). 12/17/20   [provider]  loperamide (IMODIUM) 2 MG capsule Take 2 mg by mouth as  needed for diarrhea or loose stools. 09/11/20   [provider]  metoprolol tartrate (LOPRESSOR) 25 MG tablet Take 1 tablet (25 mg total) by mouth 2 (two) times daily. Patient taking differently: Take 25 mg by mouth in the morning. 02/15/20   Dwyane Dee, MD  omeprazole (PRILOSEC) 40 MG capsule Take 1 capsule (40 mg total) by mouth 2 (two) times daily before a meal. Open capsule and place granules in water or applesauce to swallow 03/17/21   Gatha Mayer, MD  ondansetron (ZOFRAN-ODT) 4 MG disintegrating tablet Take 4 mg by mouth every 8 (eight) hours as needed for vomiting or nausea. 11/04/20   [provider]  ondansetron (ZOFRAN-ODT) 8 MG disintegrating tablet Take 1 tablet (8 mg total) by mouth every 8 (eight) hours as needed for nausea or vomiting. 06/01/21   Dorie Rank, MD  orphenadrine (NORFLEX) 100 MG tablet Take 1 tablet (100 mg total) by mouth 2 (two) times daily. 09/05/21   Montine Circle, PA-C  oxyCODONE-acetaminophen (PERCOCET/ROXICET) 5-325 MG tablet Take 0.25 tablets by  mouth 2 (two) times daily as needed (leg pain). 02/20/21   [provider]  oxyCODONE-acetaminophen (PERCOCET/ROXICET) 5-325 MG tablet Take 1 tablet by mouth every 6 (six) hours as needed for severe pain. Patient taking differently: Take 1 tablet by mouth daily as needed for severe pain. 06/01/21   Dorie Rank, MD  RENVELA 2.4 g PACK Take 2.4 g by mouth 3 (three) times daily. 05/04/21   [provider]  silver sulfADIAZINE (SILVADENE) 1 % cream Apply 1 application topically daily. Patient taking differently: Apply 1 application. topically daily as needed (wound care). 02/06/21   Henderly, Britni A, PA-C  simvastatin (ZOCOR) 20 MG tablet Take 20 mg by mouth at bedtime.    [provider]  trolamine salicylate (ASPERCREME/ALOE) 10 % cream Apply 1 application. topically as needed for muscle pain. 07/30/21   Blanchie Dessert, MD                                                                                                                                     Past Surgical History Past Surgical History:  Procedure Laterality Date   ARTERIOVENOUS GRAFT PLACEMENT Bilateral    "neither work" (10/24/2017)   AV FISTULA PLACEMENT Left 10/26/2018   Procedure: CREATION OF ARTERIOVENOUS FISTULA  LEFT ARM;  Surgeon: [name], MD;  Location: [facility];  Service: Vascular;  Laterality: Left;   AV FISTULA PLACEMENT Left 05/23/2020   Procedure: LEFT ARM ARTERIOVENOUS (AV) FISTULA CREATION;  Surgeon: [name], MD;  Location: [facility];  Service: Vascular;  Laterality: Left;   BALLOON DILATION N/A 01/19/2021   Procedure: BALLOON DILATION;  Surgeon: Gatha Mayer, MD;  [facility];  Service: Endoscopy;  Laterality: N/A;   BALLOON DILATION N/A 02/10/2021   Procedure: BALLOON DILATION;  Surgeon: Irene Shipper, MD;  [facility];  Service: Endoscopy;  Laterality: N/A;   BALLOON DILATION N/A 02/19/2021   Procedure: BALLOON DILATION;  Surgeon: Sharyn Creamer, MD;  Location[facility];  Service: Gastroenterology;  Laterality: N/A;   BASCILIC VEIN TRANSPOSITION Left 12/21/2018   Procedure: Left arm BASILIC VEIN TRANSPOSITION SECOND STAGE;  Surgeon: [name], MD;  Location: [facility];  Service: Vascular;  Laterality: Left;   CHOLECYSTECTOMY N/A 06/30/2017   Procedure: LAPAROSCOPIC CHOLECYSTECTOMY WITH INTRAOPERATIVE CHOLANGIOGRAM;  Surgeon: Excell Seltzer, MD;  [facility];  Service: General;  Laterality: N/A;   ESOPHAGOGASTRODUODENOSCOPY (EGD) WITH PROPOFOL N/A 07/04/2017   Procedure: ESOPHAGOGASTRODUODENOSCOPY (EGD) WITH PROPOFOL;  Surgeon: Clarene Essex, MD;  [facility];  Service: Endoscopy;  Laterality: N/A;   ESOPHAGOGASTRODUODENOSCOPY (EGD) WITH PROPOFOL N/A 08/10/2020   Procedure: ESOPHAGOGASTRODUODENOSCOPY (EGD) WITH PROPOFOL;  Surgeon: Doran Stabler, MD;  Location: [facility];  Service: Gastroenterology;  Laterality: N/A;    ESOPHAGOGASTRODUODENOSCOPY (EGD) WITH PROPOFOL N/A 01/19/2021   Procedure: ESOPHAGOGASTRODUODENOSCOPY (EGD) WITH PROPOFOL;  Surgeon: Gatha Mayer, MD;  [facility];  Service: Endoscopy;  Laterality: N/A;  WITH FLUOROSCOPY AND DILATION   ESOPHAGOGASTRODUODENOSCOPY (EGD) WITH PROPOFOL N/A 02/03/2021   Procedure: ESOPHAGOGASTRODUODENOSCOPY (EGD) WITH PROPOFOL;  Surgeon: Gatha Mayer, MD;  [facility];  Service: Endoscopy;  Laterality: N/A;   ESOPHAGOGASTRODUODENOSCOPY (EGD) WITH PROPOFOL N/A 02/10/2021   Procedure: ESOPHAGOGASTRODUODENOSCOPY (EGD) WITH PROPOFOL;  Surgeon: Irene Shipper, MD;  [facility];  Service: Endoscopy;  Laterality: N/A;  Balloon Dilation   ESOPHAGOGASTRODUODENOSCOPY (EGD) WITH PROPOFOL N/A 02/19/2021   Procedure: ESOPHAGOGASTRODUODENOSCOPY (EGD) WITH PROPOFOL;  Surgeon: Sharyn Creamer, MD;  [facility];  Service: Gastroenterology;  Laterality: N/A;   ESOPHAGOGASTRODUODENOSCOPY (EGD) WITH PROPOFOL N/A 03/12/2021   Procedure: ESOPHAGOGASTRODUODENOSCOPY (EGD) WITH PROPOFOL;  Surgeon: Lavena Bullion, DO;  [facility];  Service: Gastroenterology;  Laterality: N/A;   FOREIGN BODY REMOVAL N/A 02/03/2021   Procedure: FOREIGN BODY REMOVAL;  Surgeon: Gatha Mayer, MD;  [facility];  Service: Endoscopy;  Laterality: N/A;   INSERTION OF DIALYSIS CATHETER N/A 03/20/2018   Procedure: INSERTION OF TUNNELED DIALYSIS CATHETER - RIGHT INTERANL JUGULAR PLACEMENT;  Surgeon: Angelia Mould, MD;  Location: [facility];  Service: Vascular;  Laterality: N/A;   IR FLUORO GUIDE CV LINE RIGHT  04/18/2020   IR GUIDED DRAIN W CATHETER PLACEMENT  10/28/2018   KIDNEY TRANSPLANT  2007   KIDNEY TRANSPLANT Right    PARATHYROIDECTOMY  ?2012   "3/4 removed" (10/24/2017)   RENAL BIOPSY Bilateral 2003   REVISON OF ARTERIOVENOUS FISTULA Left 09/24/2020   Procedure: LEFT UPPER ARM ARTERIOVENOUS GRAFT CREATION;  Surgeon: [name],  MD;  Location: [facility];  Service: Vascular;  Laterality: Left;   UPPER EXTREMITY VENOGRAPHY Bilateral 10/19/2018   Procedure: UPPER EXTREMITY VENOGRAPHY;  Surgeon: [name], MD;  Location: [facility];  Service: Cardiovascular;  Laterality: Bilateral;  Bilateral    UPPER EXTREMITY VENOGRAPHY Left 09/04/2020   Procedure: UPPER EXTREMITY VENOGRAPHY - Left Upper;  Surgeon: [name], MD;  Location: [facility];  Service: Cardiovascular;  Laterality: Left;   Family History Family History  Problem Relation Age of Onset   Arthritis Mother    Hypertension Mother    Aneurysm Mother        died of brain aneurysm   CAD Father        Has 3 stents   Diabetes Father        borderline   Early death Brother        Died in war   Colon cancer Neg Hx    Esophageal cancer Neg Hx    Rectal cancer Neg Hx    Stomach cancer Neg Hx     Social History Social History   Tobacco Use   Smoking status: Never    Passive exposure: Never   Smokeless tobacco: Never  Vaping Use   Vaping Use: Never used  Substance Use Topics   Alcohol use: Not Currently   Drug use: Never   Allergies Diphenhydramine, Motrin [ibuprofen], Contrast media [iodinated contrast media], Shellfish allergy, Banana, Chlorhexidine, Ferrous sulfate, and Iron dextran  Review of Systems Review of Systems  All other systems reviewed and are negative.   Physical Exam Vital Signs  I have reviewed the triage vital signs BP (!) 158/128   Pulse (!) 112   Temp 98.8 F (37.1 C) (Oral)   Resp (!) 30   SpO2 98%  Physical Exam Vitals and nursing note reviewed.  Constitutional:      General: She is in  acute distress (due to pain).     Appearance: She is well-developed.  HENT:     Head: Normocephalic and atraumatic.     Mouth/Throat:     Mouth: Mucous membranes are moist.  Eyes:     Pupils: Pupils are equal, round, and reactive to light.  Cardiovascular:     Rate and Rhythm: Normal rate and regular  rhythm.     Heart sounds: No murmur heard. Pulmonary:     Effort: Pulmonary effort is normal. No respiratory distress.     Breath sounds: Normal breath sounds.  Abdominal:     General: Abdomen is flat.     Palpations: Abdomen is soft.     Tenderness: There is generalized abdominal tenderness.  Musculoskeletal:        General: No tenderness.     Right lower leg: No edema.     Left lower leg: No edema.  Skin:    General: Skin is warm and dry.  Neurological:     General: No focal deficit present.     Mental Status: She is alert. Mental status is at baseline.  Psychiatric:        Mood and Affect: Mood normal.        Behavior: Behavior normal.     ED Results and Treatments Labs (all labs ordered are listed, but only abnormal results are displayed) Labs Reviewed  COMPREHENSIVE METABOLIC PANEL - Abnormal; Notable for the following components:      Result Value   Chloride 91 (*)    Glucose, Bld 398 (*)    BUN 24 (*)    Creatinine, Ser 4.95 (*)    Total Protein 9.7 (*)    GFR, Estimated 11 (*)    Anion gap 20 (*)    All other components within normal limits  OCCULT BLOOD X 1 CARD TO LAB, STOOL - Abnormal; Notable for the following components:   Fecal Occult Bld POSITIVE (*)    All other components within normal limits  CBC - Abnormal; Notable for the following components:   WBC 12.5 (*)    RBC 3.84 (*)    MCV 102.3 (*)    RDW 15.7 (*)    All other components within normal limits  DIFFERENTIAL - Abnormal; Notable for the following components:   Neutro Abs 10.1 (*)    Abs Immature Granulocytes 0.12 (*)    All other components within normal limits  LIPASE, BLOOD  PROTIME-INR  CBC WITH DIFFERENTIAL/PLATELET  BETA-HYDROXYBUTYRIC ACID                                                                                                                          Radiology No results found.  Pertinent labs & imaging results that were available during my care of the patient were  reviewed by me and considered in my medical decision making (see MDM for details).  Medications Ordered in ED Medications  sodium chloride 0.9 % bolus 1,000 mL (1,000 mLs  Intravenous New Bag/Given 12/26/21 1538)  HYDROmorphone (DILAUDID) injection 0.5 mg (0.5 mg Intravenous Given 12/26/21 1532)  ondansetron (ZOFRAN) injection 4 mg (4 mg Intravenous Given 12/26/21 1532)                                                                                                                                     Procedures Procedures  (including critical care time)  Medical Decision Making / ED Course   MDM:  36 year old female presenting with abdominal pain.  Patient uncomfortable appearing, in mild distress due to pain.  Tachycardia noted on vitals with mild tachypnea.  Patient did have vomit with some streaks of blood in it.  No gross hematemesis.  Differential includes intra-abdominal infection such as appendicitis, perforation, obstruction, cholecystitis, pancreatitis, gastritis.  Labs notable for occult blood positive stool.  Hemoglobin is reassuring.  Glucose is elevated with mild elevation in anion gap which could represent mild diabetic ketoacidosis.  Will send beta hydroxybutyrate.  Significant abdominal pain with signs of GI bleeding will obtain CT abdomen.  Given new GI bleeding patient may need to be admitted for observation given her medical comorbidities.  Clinical Course as of 12/26/21 1645  Sat Dec 26, 2021  1621 Signed out to Dr. Rogene Houston pending CT, beta hydroxybutryate. Patient does have hyperglycemia and elevated anion gap, may have mild DKA. FOBT is positive but hemoglobin is reassuring.  [WS]    Clinical Course User Index [WS] Truett Mainland, Livingston Diones, MD     Additional history obtained: -Additional history obtained from friend -External records from outside source obtained and reviewed including: Chart review including previous notes, labs, imaging, consultation notes  including labs from [facility] last night pt [facility]BS   Lab Tests: -I ordered, reviewed, and interpreted labs.   The pertinent results include:   Labs Reviewed  COMPREHENSIVE METABOLIC PANEL - Abnormal; Notable for the following components:      Result Value   Chloride 91 (*)    Glucose, Bld 398 (*)    BUN 24 (*)    Creatinine, Ser 4.95 (*)    Total Protein 9.7 (*)    GFR, Estimated 11 (*)    Anion gap 20 (*)    All other components within normal limits  OCCULT BLOOD X 1 CARD TO LAB, STOOL - Abnormal; Notable for the following components:   Fecal Occult Bld POSITIVE (*)    All other components within normal limits  CBC - Abnormal; Notable for the following components:   WBC 12.5 (*)    RBC 3.84 (*)    MCV 102.3 (*)    RDW 15.7 (*)    All other components within normal limits  DIFFERENTIAL - Abnormal; Notable for the following components:   Neutro Abs 10.1 (*)    Abs Immature Granulocytes 0.12 (*)    All other components within normal limits  LIPASE, BLOOD  PROTIME-INR  CBC WITH DIFFERENTIAL/PLATELET  BETA-HYDROXYBUTYRIC ACID  Notable for anion gap, FOBT positive  EKG   EKG Interpretation  Date/Time:  Saturday December 26 2021 13:01:42 EDT Ventricular Rate:  111 PR Interval:  120 QRS Duration: 83 QT Interval:  370 QTC Calculation: 503 R Axis:   92 Text Interpretation: Sinus tachycardia Borderline right axis deviation Consider left ventricular hypertrophy Borderline prolonged QT interval Confirmed by Garnette Gunner [phone]) on 12/26/2021 1:28:45 PM         Imaging Studies ordered: I ordered imaging studies including CT abdomen I independently visualized and interpreted imaging. I agree with the radiologist interpretation   Medicines ordered and prescription drug management: Meds ordered this encounter  Medications   sodium chloride 0.9 % bolus 1,000 mL   HYDROmorphone (DILAUDID) injection 0.5 mg   ondansetron (ZOFRAN) injection 4 mg    -I have reviewed the  patients home medicines and have made adjustments as needed  Cardiac Monitoring: The patient was maintained on a cardiac monitor.  I personally viewed and interpreted the cardiac monitored which showed an underlying rhythm of: sinus tachycardia  Social Determinants of Health:  Diagnosis or treatment significantly limited by social determinants of health: learning disability   Reevaluation: After the interventions noted above, I reevaluated the patient and found that they have improved  Co morbidities that complicate the patient evaluation  Past Medical History:  Diagnosis Date   Anemia    Blood transfusion without reported diagnosis    Cellulitis of left leg 03/01/2018   Chronic kidney disease    kidney transplant 07   Diabetes mellitus    Pt reports diagnosis in June 2011, Type 2   Diabetes mellitus without complication ([place])    Esophageal obstruction due to food impaction    GERD (gastroesophageal reflux disease)    Hyperlipidemia    Hypertension    Intra-abdominal abscess ([place]) 10/28/2018   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   LEARNING DISABILITY 09/25/2007   Qualifier: Diagnosis of  By: Deborra Medina MD, Talia     Prolonged Q-T interval on ECG    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 Disposition pending at sign out.     Final Clinical Impression(s) / ED Diagnoses Final diagnoses:  Gastrointestinal hemorrhage, unspecified gastrointestinal hemorrhage type  Abdominal pain, unspecified abdominal location     This chart was dictated using voice recognition software.  Despite best efforts to proofread,  errors can occur which can  change the documentation meaning.    Cristie Hem, MD 12/26/21 [phone]

## 2021-12-26 NOTE — ED Notes (Signed)
Pt continues to remove VS monitoring. Was informed of importance to stay on monitor system, especially while infusing IV potassium. Pt verbalized understanding.

## 2021-12-26 NOTE — ED Notes (Signed)
Pt called for vitals x2 with no answer.

## 2021-12-26 NOTE — ED Notes (Signed)
Pt stated she no longer make urine

## 2021-12-26 NOTE — ED Notes (Signed)
Pt placed on 3lpm [place] due to low Ox sat after receiving Dilaudid.

## 2021-12-26 NOTE — Progress Notes (Signed)
  [facility] will assume care on arrival to accepting facility. Until arrival, care as per EDP. However, [facility] available 24/7 for questions and assistance.   Nursing staff please page [facility] Admits and Consults ([phone]) as soon as the patient arrives to the hospital.  Hillari Zumwalt, DO [facility]  

## 2021-12-26 NOTE — ED Notes (Signed)
Pt stated she was incontinent of bowel and had a large loose stool that was tar consistency and color. Pt also had small amount of blood in emesis bag. Pt stated she had vomited however contents appeared to be mixed in with sputum. Pt was assisted with being cleaned up and sheets were changed. Pt was assisted back in bed and helped get into a comfortable position.

## 2021-12-26 NOTE — ED Triage Notes (Signed)
Pt comes in private vehicle stating she is having abdominal pain, pt had dialysis today, but no fluid removed. Pt states she was able to finish her treatment. Pt states she has been throwing up all day, even before dialysis and reports seeing some blood.

## 2021-12-27 ENCOUNTER — Emergency Department (HOSPITAL_BASED_OUTPATIENT_CLINIC_OR_DEPARTMENT_OTHER): Payer: Medicare Other

## 2021-12-27 DIAGNOSIS — E1065 Type 1 diabetes mellitus with hyperglycemia: Secondary | ICD-10-CM | POA: Diagnosis not present

## 2021-12-27 DIAGNOSIS — D631 Anemia in chronic kidney disease: Secondary | ICD-10-CM | POA: Diagnosis not present

## 2021-12-27 DIAGNOSIS — R1084 Generalized abdominal pain: Secondary | ICD-10-CM | POA: Diagnosis present

## 2021-12-27 DIAGNOSIS — I10 Essential (primary) hypertension: Secondary | ICD-10-CM

## 2021-12-27 DIAGNOSIS — E785 Hyperlipidemia, unspecified: Secondary | ICD-10-CM | POA: Diagnosis not present

## 2021-12-27 DIAGNOSIS — I120 Hypertensive chronic kidney disease with stage 5 chronic kidney disease or end stage renal disease: Secondary | ICD-10-CM | POA: Diagnosis not present

## 2021-12-27 DIAGNOSIS — E1022 Type 1 diabetes mellitus with diabetic chronic kidney disease: Secondary | ICD-10-CM | POA: Diagnosis not present

## 2021-12-27 DIAGNOSIS — K449 Diaphragmatic hernia without obstruction or gangrene: Secondary | ICD-10-CM | POA: Diagnosis not present

## 2021-12-27 DIAGNOSIS — Z8261 Family history of arthritis: Secondary | ICD-10-CM | POA: Diagnosis not present

## 2021-12-27 DIAGNOSIS — K922 Gastrointestinal hemorrhage, unspecified: Secondary | ICD-10-CM | POA: Diagnosis not present

## 2021-12-27 DIAGNOSIS — E1043 Type 1 diabetes mellitus with diabetic autonomic (poly)neuropathy: Secondary | ICD-10-CM | POA: Diagnosis not present

## 2021-12-27 DIAGNOSIS — N186 End stage renal disease: Secondary | ICD-10-CM | POA: Diagnosis not present

## 2021-12-27 DIAGNOSIS — G40909 Epilepsy, unspecified, not intractable, without status epilepticus: Secondary | ICD-10-CM | POA: Diagnosis not present

## 2021-12-27 DIAGNOSIS — R109 Unspecified abdominal pain: Secondary | ICD-10-CM

## 2021-12-27 DIAGNOSIS — Z992 Dependence on renal dialysis: Secondary | ICD-10-CM

## 2021-12-27 DIAGNOSIS — K2101 Gastro-esophageal reflux disease with esophagitis, with bleeding: Secondary | ICD-10-CM | POA: Diagnosis not present

## 2021-12-27 DIAGNOSIS — Z79899 Other long term (current) drug therapy: Secondary | ICD-10-CM | POA: Diagnosis not present

## 2021-12-27 DIAGNOSIS — A0839 Other viral enteritis: Secondary | ICD-10-CM | POA: Diagnosis not present

## 2021-12-27 DIAGNOSIS — K769 Liver disease, unspecified: Secondary | ICD-10-CM | POA: Diagnosis present

## 2021-12-27 DIAGNOSIS — N2581 Secondary hyperparathyroidism of renal origin: Secondary | ICD-10-CM | POA: Diagnosis not present

## 2021-12-27 DIAGNOSIS — K921 Melena: Secondary | ICD-10-CM

## 2021-12-27 DIAGNOSIS — Z794 Long term (current) use of insulin: Secondary | ICD-10-CM | POA: Diagnosis not present

## 2021-12-27 DIAGNOSIS — D62 Acute posthemorrhagic anemia: Secondary | ICD-10-CM | POA: Diagnosis not present

## 2021-12-27 DIAGNOSIS — Z888 Allergy status to other drugs, medicaments and biological substances status: Secondary | ICD-10-CM | POA: Diagnosis not present

## 2021-12-27 DIAGNOSIS — F819 Developmental disorder of scholastic skills, unspecified: Secondary | ICD-10-CM | POA: Diagnosis not present

## 2021-12-27 DIAGNOSIS — U071 COVID-19: Secondary | ICD-10-CM | POA: Diagnosis present

## 2021-12-27 DIAGNOSIS — K3184 Gastroparesis: Secondary | ICD-10-CM | POA: Diagnosis not present

## 2021-12-27 DIAGNOSIS — K222 Esophageal obstruction: Secondary | ICD-10-CM | POA: Diagnosis not present

## 2021-12-27 LAB — RESP PANEL BY RT-PCR (FLU A&B, COVID) ARPGX2
Influenza A by PCR: NEGATIVE
Influenza B by PCR: NEGATIVE
SARS Coronavirus 2 by RT PCR: POSITIVE — AB

## 2021-12-27 LAB — BASIC METABOLIC PANEL
Anion gap: 11 (ref 5–15)
BUN: 39 mg/dL — ABNORMAL HIGH (ref 6–20)
CO2: 29 mmol/L (ref 22–32)
Calcium: 8.8 mg/dL — ABNORMAL LOW (ref 8.9–10.3)
Chloride: 99 mmol/L (ref 98–111)
Creatinine, Ser: 6.53 mg/dL — ABNORMAL HIGH (ref 0.44–1.00)
GFR, Estimated: 8 mL/min — ABNORMAL LOW (ref >60)
Glucose, Bld: 208 mg/dL — ABNORMAL HIGH (ref 70–99)
Potassium: 4.5 mmol/L (ref 3.5–5.1)
Sodium: 139 mmol/L (ref 135–145)

## 2021-12-27 LAB — CBG MONITORING, ED
Glucose-Capillary: 420 mg/dL — ABNORMAL HIGH (ref 70–99)
Glucose-Capillary: 388 mg/dL — ABNORMAL HIGH (ref 70–99)

## 2021-12-27 LAB — TROPONIN I (HIGH SENSITIVITY)
Troponin I (High Sensitivity): 17 ng/L (ref <18)
Troponin I (High Sensitivity): 12 ng/L (ref <18)

## 2021-12-27 LAB — GLUCOSE, CAPILLARY
Glucose-Capillary: 312 mg/dL — ABNORMAL HIGH (ref 70–99)
Glucose-Capillary: 332 mg/dL — ABNORMAL HIGH (ref 70–99)

## 2021-12-27 LAB — OSMOLALITY: Osmolality: 316 mOsm/kg — ABNORMAL HIGH (ref 275–295)

## 2021-12-27 MED ORDER — HYDROMORPHONE HCL 1 MG/ML IJ SOLN
0.5000 mg | Freq: Once | INTRAMUSCULAR | Status: AC
  Administered 2021-12-27: 0.5 mg via INTRAVENOUS
  Filled 2021-12-27: qty 1

## 2021-12-27 MED ORDER — MOLNUPIRAVIR EUA 200MG CAPSULE
4.0000 | ORAL_CAPSULE | Freq: Two times a day (BID) | ORAL | Status: DC
  Administered 2021-12-27 – 2021-12-31 (×9): 800 mg via ORAL
  Filled 2021-12-27: qty 4

## 2021-12-27 MED ORDER — PANTOPRAZOLE SODIUM 40 MG IV SOLR
INTRAVENOUS | Status: AC
  Filled 2021-12-27: qty 20

## 2021-12-27 MED ORDER — LABETALOL HCL 5 MG/ML IV SOLN: 20.0000 mg | Freq: Once | INTRAVENOUS | Status: DC

## 2021-12-27 MED ORDER — ONDANSETRON HCL 4 MG PO TABS: 4.0000 mg | ORAL_TABLET | Freq: Four times a day (QID) | ORAL | Status: DC | PRN

## 2021-12-27 MED ORDER — ACETAMINOPHEN 325 MG PO TABS
650.0000 mg | ORAL_TABLET | Freq: Four times a day (QID) | ORAL | Status: DC | PRN
  Administered 2021-12-30 – 2022-01-01 (×3): 650 mg via ORAL
  Filled 2021-12-27 (×3): qty 2

## 2021-12-27 MED ORDER — INSULIN ASPART PROT & ASPART (70-30 MIX) 100 UNIT/ML ~~LOC~~ SUSP: 5.0000 [IU] | Freq: Once | SUBCUTANEOUS | Status: DC

## 2021-12-27 MED ORDER — HYDROMORPHONE HCL 1 MG/ML IJ SOLN
0.5000 mg | INTRAMUSCULAR | Status: DC | PRN
  Administered 2021-12-27 – 2021-12-31 (×13): 0.5 mg via INTRAVENOUS
  Filled 2021-12-27 (×13): qty 0.5

## 2021-12-27 MED ORDER — ONDANSETRON HCL 4 MG/2ML IJ SOLN: 4.0000 mg | Freq: Four times a day (QID) | INTRAMUSCULAR | Status: DC | PRN

## 2021-12-27 MED ORDER — FENTANYL CITRATE PF 50 MCG/ML IJ SOSY
50.0000 ug | PREFILLED_SYRINGE | Freq: Once | INTRAMUSCULAR | Status: AC
  Administered 2021-12-27: 50 ug via INTRAVENOUS
  Filled 2021-12-27: qty 1

## 2021-12-27 MED ORDER — INSULIN ASPART 100 UNIT/ML IJ SOLN
5.0000 [IU] | Freq: Once | INTRAMUSCULAR | Status: AC
  Administered 2021-12-27: 5 [IU] via SUBCUTANEOUS

## 2021-12-27 MED ORDER — IOHEXOL 350 MG/ML SOLN
100.0000 mL | Freq: Once | INTRAVENOUS | Status: AC | PRN
  Administered 2021-12-27: 100 mL via INTRAVENOUS

## 2021-12-27 MED ORDER — LABETALOL HCL 5 MG/ML IV SOLN
20.0000 mg | INTRAVENOUS | Status: DC | PRN
  Administered 2021-12-27 – 2021-12-30 (×4): 20 mg via INTRAVENOUS
  Filled 2021-12-27 (×5): qty 4

## 2021-12-27 MED ORDER — INSULIN GLARGINE-YFGN 100 UNIT/ML ~~LOC~~ SOLN
5.0000 [IU] | Freq: Every day | SUBCUTANEOUS | Status: DC
  Administered 2021-12-27 – 2021-12-31 (×4): 5 [IU] via SUBCUTANEOUS
  Filled 2021-12-27 (×7): qty 0.05

## 2021-12-27 MED ORDER — METHYLPREDNISOLONE SODIUM SUCC 40 MG IJ SOLR
40.0000 mg | Freq: Once | INTRAMUSCULAR | Status: AC
  Administered 2021-12-27: 40 mg via INTRAVENOUS
  Filled 2021-12-27: qty 1

## 2021-12-27 MED ORDER — INSULIN ASPART 100 UNIT/ML IJ SOLN
0.0000 [IU] | INTRAMUSCULAR | Status: DC
  Administered 2021-12-27: 7 [IU] via SUBCUTANEOUS
  Administered 2021-12-28: 2 [IU] via SUBCUTANEOUS
  Administered 2021-12-28: 3 [IU] via SUBCUTANEOUS
  Administered 2021-12-28: 1 [IU] via SUBCUTANEOUS
  Administered 2021-12-28: 7 [IU] via SUBCUTANEOUS
  Administered 2021-12-29 (×3): 1 [IU] via SUBCUTANEOUS
  Administered 2021-12-30: 2 [IU] via SUBCUTANEOUS
  Administered 2021-12-30: 1 [IU] via SUBCUTANEOUS
  Administered 2021-12-30: 3 [IU] via SUBCUTANEOUS
  Administered 2021-12-30: 2 [IU] via SUBCUTANEOUS
  Administered 2021-12-30: 1 [IU] via SUBCUTANEOUS
  Administered 2021-12-31: 3 [IU] via SUBCUTANEOUS
  Administered 2021-12-31: 1 [IU] via SUBCUTANEOUS
  Administered 2021-12-31: 3 [IU] via SUBCUTANEOUS
  Administered 2021-12-31 – 2022-01-01 (×3): 1 [IU] via SUBCUTANEOUS
  Administered 2022-01-01: 2 [IU] via SUBCUTANEOUS

## 2021-12-27 MED ORDER — LABETALOL HCL 5 MG/ML IV SOLN
20.0000 mg | INTRAVENOUS | Status: DC | PRN
  Administered 2021-12-27: 20 mg via INTRAVENOUS
  Filled 2021-12-27: qty 4

## 2021-12-27 MED ORDER — HYDROXYZINE HCL 25 MG PO TABS
25.0000 mg | ORAL_TABLET | Freq: Three times a day (TID) | ORAL | Status: DC | PRN
  Administered 2021-12-27 – 2021-12-31 (×2): 25 mg via ORAL
  Filled 2021-12-27 (×3): qty 1

## 2021-12-27 NOTE — ED Notes (Signed)
She c/o "heartburn" (points at entire mid chest and epigastrium). EKG performed and Dr. Armandina Gemma notified.

## 2021-12-27 NOTE — Assessment & Plan Note (Addendum)
Sensitive SSI Q4H for the moment Lantus 5u QHS Anion gap in ED yesterday, resolved on BMP this AM.  Getting repeat stat BMP now to ensure no recurrent anion gap.

## 2021-12-27 NOTE — Assessment & Plan Note (Signed)
Nephrology called by EDP. Getting stat BMP now to make sure she still has no emergent HD indications (none as of 8am this morning). Assuming still no emergent indications, call nephro in AM to let them know shes arrived to hospital finally.

## 2021-12-27 NOTE — Assessment & Plan Note (Signed)
Cont home HTN meds when med rec completed.

## 2021-12-27 NOTE — ED Notes (Signed)
RT called to pt room for desaturation and pulse ox not reading. RT fixed pulse ox and place pt on [place] 3 Lpm. Pt sats improved to 99% post placement. Pt BLBS clear throughout all lung fields. Pt respiratory status stable on [place] 3 Lpm w/no distress noted at this time. RT will continue to monitor while in [facility] ED.

## 2021-12-27 NOTE — ED Provider Notes (Signed)
  Physical Exam  BP (!) 181/101   Pulse 85   Temp 97.8 F (36.6 C) (Oral)   Resp 15   SpO2 100%     Procedures  Procedures  ED Course / MDM   Clinical Course as of 12/27/21 1336  Sat Dec 26, 2021  1621 Signed out to Dr. Rogene Houston pending CT, beta hydroxybutryate. Patient does have hyperglycemia and elevated anion gap, may have mild DKA. FOBT is positive but hemoglobin is reassuring.  [WS]  Sun Dec 27, 2021  1333 SARS Coronavirus 2 by RT PCR(!): POSITIVE [JL]    Clinical Course User Index [JL] Regan Lemming, MD [WS] Cristie Hem, MD   Medical Decision Making Amount and/or Complexity of Data Reviewed Labs: ordered. Decision-making details documented in ED Course. Radiology: ordered.  Risk Prescription drug management. Decision regarding hospitalization.   36 year old female who has been admitted to the hospitalist services for 20 hours now presenting with abdominal pain.  The patient states that this morning within the last hour she developed sudden onset severe ripping and tearing pain in her chest radiating to her back.  She endorses nausea.  The pain is sharp and severe and not alleviated by anything.  She previously had a CT without contrast yesterday of her abdomen pelvis due to abdominal pain on presentation and results are as follows: IMPRESSION:  1. Circumferential wall thickening and mild fat stranding about the  cecum, with significant fibrofatty mural stratification. Probable  additional circumferential wall thickening and fat stranding about  the rectum. Findings are consistent with nonspecific infectious or  inflammatory colitis.  2. Multiple somewhat ill-defined hypodense lesions throughout the  liver. These are incompletely characterized on this noncontrast  examination however are similar to multiple prior examinations and  possibly reflecting hemangiomata or other benign lesions such as  focal nodular hyperplasias. As previously reported, recommend   nonemergent multiphasic contrast enhanced CT or MRI to fully  characterize.  3. Right kidney is absent. Left kidney is severely atrophic.  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   Differential diagnosis includes aortic dissection.  Patient also presenting with worsening abdominal pain, considered mesenteric ischemia, ACS, worsening colitis, perforated viscus.  Also considered hypervolemia following fluid resuscitation resulting in chest pain.  An EKG was performed which revealed sinus rhythm, ventricular rate 8 0, no acute ischemic changes noted.  Troponins were ordered.  The patient was hypertensive with BPs in the 782N systolic.  Heart rate in the 90s.  Will administer 20 mg of labetalol push dose x3 and fentanyl for pain.   The patient has a shellfish allergy.  She has tolerated contrast IV in the past but will require premedication for CT angiogram for dissection which was ordered.  The patient also has a history of an anaphylactic allergy to Benadryl.  Will administer 125 mg of Solu-Medrol and then undertake CTA of the chest abdomen pelvis under the 4-hour protocol.  Troponin resulted 17. Pt resting /asleep following BP control. After labetalol, her blood pressure improved to 125/72, heart rate 78, resting comfortably.  suspect likely hypervolemia, hypertensive urgency.   Patient resulted COVID-positive by PCR testing.  CTA resulted negative for dissection or other acute abnormality beyond colitis seen previously on imaging.    Regan Lemming, MD 12/27/21 1535

## 2021-12-27 NOTE — Assessment & Plan Note (Signed)
Multiple liver lesions. Needs follow up MRI as outpt.

## 2021-12-27 NOTE — Assessment & Plan Note (Addendum)
Hematemesis and melena. HGBs yesterday were reassuring, but no HGB checked in over 24h now due to being at med center waiting for bed for extended period. Suspect acute GI symptoms (N/V/D, colitis on CT and ABD pain) may be secondary to COVID-19.  Pt does have underlying chronic diabetic gastroparesis as well.  Also h/o Grade D esophagitis in 2022. 1. Check repeat HGB stat 2. Continue PPI GTT 3. Sent message to Dr. Hilarie Fredrickson for AM GI consult 4. Depending on symptoms: clear liquid diet vs NPO 5. Tele monitor 6. Repeat CBC in AM

## 2021-12-27 NOTE — H&P (Signed)
History and Physical    Patient: [name] QHU:[id number] DOB: 02-08-1986 DOA: 12/26/2021 DOS: the patient was seen and examined on 12/27/2021 PCP: [name], MD  Patient coming from: Home  Chief Complaint:  Chief Complaint  Patient presents with   Abdominal Pain   HPI: [name] is a 36 y.o. female with medical history significant of ESRD, DM, pseudoseizures, gastroparesis, esophagitis, failed kidney transplant.  Pt in to ED at med center yesterday with hematemesis and melena.  Hemoccult positive.  COVID positive as well.  HGB looked okay yesterday.  Transfer for GIB requested but unfortunately due to lack of bed availability patient not transferred until today.  Question of DKA while in ED.  Had anion gap yesterday, given [place] insulin, anion gap resolved on BMP this morning at 0800.    Review of Systems: As mentioned in the history of present illness. All other systems reviewed and are negative. Past Medical History:  Diagnosis Date   Anemia    Blood transfusion without reported diagnosis    Cellulitis of left leg 03/01/2018   Chronic kidney disease    kidney transplant 07   Diabetes mellitus    Pt reports diagnosis in June 2011, Type 2   Diabetes mellitus without complication ([place])    Esophageal obstruction due to food impaction    GERD (gastroesophageal reflux disease)    Hyperlipidemia    Hypertension    Intra-abdominal abscess ([place]) 10/28/2018   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   LEARNING DISABILITY 09/25/2007   Qualifier: Diagnosis of  By: Deborra Medina MD, Talia     Prolonged Q-T interval on ECG    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   Pyelonephritis 06/23/2014   Renal and perinephric abscess 11/01/2018   Renal disorder    Seasonal allergies    Seizures ([place])    UTI (urinary tract infection) 01/09/2015   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   Qualifier: Diagnosis of  By: Carlena Sax  MD, Colletta [place]     Past Surgical History:  Procedure Laterality Date    ARTERIOVENOUS GRAFT PLACEMENT Bilateral    "neither work" (10/24/2017)   AV FISTULA PLACEMENT Left 10/26/2018   Procedure: CREATION OF ARTERIOVENOUS FISTULA  LEFT ARM;  Surgeon: [name], MD;  Location: [place];  Service: Vascular;  Laterality: Left;   AV FISTULA PLACEMENT Left 05/23/2020   Procedure: LEFT ARM ARTERIOVENOUS (AV) FISTULA CREATION;  Surgeon: [name], MD;  Location: [place];  Service: Vascular;  Laterality: Left;   BALLOON DILATION N/A 01/19/2021   Procedure: Larrie Kass DILATION;  Surgeon: Gatha Mayer, MD;  Location: [facility];  Service: Endoscopy;  Laterality: N/A;   BALLOON DILATION N/A 02/10/2021   Procedure: BALLOON DILATION;  Surgeon: Irene Shipper, MD;  Location: [facility];  Service: Endoscopy;  Laterality: N/A;   BALLOON DILATION N/A 02/19/2021   Procedure: BALLOON DILATION;  Surgeon: Sharyn Creamer, MD;  Location: [facility];  Service: Gastroenterology;  Laterality: N/A;   BASCILIC VEIN TRANSPOSITION Left 12/21/2018   Procedure: Left arm BASILIC VEIN TRANSPOSITION SECOND STAGE;  Surgeon: [name], MD;  Location: [place];  Service: Vascular;  Laterality: Left;   CHOLECYSTECTOMY N/A 06/30/2017   Procedure: LAPAROSCOPIC CHOLECYSTECTOMY WITH INTRAOPERATIVE CHOLANGIOGRAM;  Surgeon: Excell Seltzer, MD;  Location: [facility];  Service: General;  Laterality: N/A;   ESOPHAGOGASTRODUODENOSCOPY (EGD) WITH PROPOFOL N/A 07/04/2017   Procedure: ESOPHAGOGASTRODUODENOSCOPY (EGD) WITH PROPOFOL;  Surgeon: Clarene Essex, MD;  [facility];  Service: Endoscopy;  Laterality: N/A;  ESOPHAGOGASTRODUODENOSCOPY (EGD) WITH PROPOFOL N/A 08/10/2020   Procedure: ESOPHAGOGASTRODUODENOSCOPY (EGD) WITH PROPOFOL;  Surgeon: Doran Stabler, MD;  Location: [facility];  Service: Gastroenterology;  Laterality: N/A;   ESOPHAGOGASTRODUODENOSCOPY (EGD) WITH PROPOFOL N/A 01/19/2021   Procedure: ESOPHAGOGASTRODUODENOSCOPY (EGD) WITH PROPOFOL;  Surgeon: Gatha Mayer,  MD;  [facility];  Service: Endoscopy;  Laterality: N/A;  WITH FLUOROSCOPY AND DILATION   ESOPHAGOGASTRODUODENOSCOPY (EGD) WITH PROPOFOL N/A 02/03/2021   Procedure: ESOPHAGOGASTRODUODENOSCOPY (EGD) WITH PROPOFOL;  Surgeon: Gatha Mayer, MD;  [facility];  Service: Endoscopy;  Laterality: N/A;   ESOPHAGOGASTRODUODENOSCOPY (EGD) WITH PROPOFOL N/A 02/10/2021   Procedure: ESOPHAGOGASTRODUODENOSCOPY (EGD) WITH PROPOFOL;  Surgeon: Irene Shipper, MD;  [facility];  Service: Endoscopy;  Laterality: N/A;  Balloon Dilation   ESOPHAGOGASTRODUODENOSCOPY (EGD) WITH PROPOFOL N/A 02/19/2021   Procedure: ESOPHAGOGASTRODUODENOSCOPY (EGD) WITH PROPOFOL;  Surgeon: Sharyn Creamer, MD;  [facility];  Service: Gastroenterology;  Laterality: N/A;   ESOPHAGOGASTRODUODENOSCOPY (EGD) WITH PROPOFOL N/A 03/12/2021   Procedure: ESOPHAGOGASTRODUODENOSCOPY (EGD) WITH PROPOFOL;  Surgeon: Lavena Bullion, DO;  [facility];  Service: Gastroenterology;  Laterality: N/A;   FOREIGN BODY REMOVAL N/A 02/03/2021   Procedure: FOREIGN BODY REMOVAL;  Surgeon: Gatha Mayer, MD;  Location[facility];  Service: Endoscopy;  Laterality: N/A;   INSERTION OF DIALYSIS CATHETER N/A 03/20/2018   Procedure: INSERTION OF TUNNELED DIALYSIS CATHETER - RIGHT INTERANL JUGULAR PLACEMENT;  Surgeon: Angelia Mould, MD;  Location: [place];  Service: Vascular;  Laterality: N/A;   IR FLUORO GUIDE CV LINE RIGHT  04/18/2020   IR GUIDED DRAIN W CATHETER PLACEMENT  10/28/2018   KIDNEY TRANSPLANT  2007   KIDNEY TRANSPLANT Right    PARATHYROIDECTOMY  ?2012   "3/4 removed" (10/24/2017)   RENAL BIOPSY Bilateral 2003   REVISON OF ARTERIOVENOUS FISTULA Left 09/24/2020   Procedure: LEFT UPPER ARM ARTERIOVENOUS GRAFT CREATION;  Surgeon: [name], MD;  Location: [facility];  Service: Vascular;  Laterality: Left;   UPPER EXTREMITY VENOGRAPHY Bilateral 10/19/2018   Procedure: UPPER EXTREMITY VENOGRAPHY;   Surgeon: [name], MD;  Location: [facility];  Service: Cardiovascular;  Laterality: Bilateral;  Bilateral    UPPER EXTREMITY VENOGRAPHY Left 09/04/2020   Procedure: UPPER EXTREMITY VENOGRAPHY - Left Upper;  Surgeon: [name], MD;  Location: [facility];  Service: Cardiovascular;  Laterality: Left;   Social History:  reports that she has never smoked. She has never been exposed to tobacco smoke. She has never used smokeless tobacco. She reports that she does not currently use alcohol. She reports that she does not use drugs.  Allergies  Allergen Reactions   Diphenhydramine Anaphylaxis   Motrin [Ibuprofen] Shortness Of Breath and Itching   Contrast Media [Iodinated Contrast Media] Itching   Shellfish Allergy Hives   Banana Itching, Nausea And Vomiting and Other (See Comments)    Sick on the stomach   Chlorhexidine Itching   Ferrous Sulfate Itching   Iron Dextran Itching and Other (See Comments)    Vein irritation     Family History  Problem Relation Age of Onset   Arthritis Mother    Hypertension Mother    Aneurysm Mother        died of brain aneurysm   CAD Father        Has 3 stents   Diabetes Father        borderline   Early death Brother        Died  in war   Colon cancer Neg Hx    Esophageal cancer Neg Hx    Rectal cancer Neg Hx    Stomach cancer Neg Hx     Prior to Admission medications   Medication Sig Start Date End Date Taking? Authorizing Provider  ACCU-CHEK SOFTCLIX LANCETS lancets Use to check blood sugar 4 times per day. 12/29/15   Renato Shin, MD  acetaminophen (TYLENOL) 500 MG tablet Take 500-1,000 mg by mouth every 6 (six) hours as needed for moderate pain.    [provider]  calcium acetate (PHOSLO) 667 MG capsule Take 1,334 mg by mouth 3 (three) times daily with meals. Take 1-2 capsules (423-5361 mg) by mouth with snacks & take 2 capsules (1334 mg) by mouth with each meal 10/26/20   [provider]   Calcium Carbonate Antacid (CALCIUM CARBONATE, DOSED IN MG ELEMENTAL CALCIUM,) 1250 MG/5ML SUSP Take 500 mg of elemental calcium by mouth daily. 10/22/20   [provider]  cetirizine (ZYRTEC) 10 MG tablet Take 10 mg by mouth daily as needed for allergies.    [provider]  famotidine (PEPCID) 40 MG tablet TAKE 1 TABLET BY MOUTH TWICE DAILY IN THE MORNING AND AT BEDTIME Patient taking differently: Take 40 mg by mouth 2 (two) times daily as needed for heartburn or indigestion. 03/17/21   Gatha Mayer, MD  fluticasone (FLONASE) 50 MCG/ACT nasal spray Place 2 sprays into both nostrils daily as needed for allergies. 09/03/18   [name], MD  gabapentin (NEURONTIN) 400 MG capsule Take 800 mg by mouth daily as needed (pain). 12/30/20   [provider]  hydrOXYzine (ATARAX/VISTARIL) 50 MG tablet Take 50 mg by mouth 2 (two) times daily as needed for itching. 11/03/20   [provider]  insulin aspart (NOVOLOG FLEXPEN) 100 UNIT/ML FlexPen Inject 0-15 Units into the skin in the morning, at noon, in the evening, and at bedtime. Sliding Scale insulin    [provider]  insulin degludec (TRESIBA) 100 UNIT/ML FlexTouch Pen Inject 20 Units into the skin daily. Patient taking differently: Inject 5-10 Units into the skin daily. 02/15/20   Dwyane Dee, MD  Insulin Pen Needle (BD PEN NEEDLE NANO 2ND GEN) 32G X 4 MM MISC 1 each by Does not apply route in the morning, at noon, in the evening, and at bedtime. 08/10/20   Barb Merino, MD  lidocaine (LIDODERM) 5 % Place 1 patch onto the skin daily. Remove & Discard patch within 12 hours or as directed by MD 08/06/21   Suella Broad A, PA-C  lidocaine (XYLOCAINE) 2 % solution Use as directed 15 mLs in the mouth or throat every 6 (six) hours as needed for mouth pain. 11/11/20   Fatima Blank, MD  lidocaine-prilocaine (EMLA) cream Apply 1 application topically daily as needed [facility]). 12/17/20   [provider]   loperamide (IMODIUM) 2 MG capsule Take 2 mg by mouth as needed for diarrhea or loose stools. 09/11/20   [provider]  metoprolol tartrate (LOPRESSOR) 25 MG tablet Take 1 tablet (25 mg total) by mouth 2 (two) times daily. Patient taking differently: Take 25 mg by mouth in the morning. 02/15/20   Dwyane Dee, MD  omeprazole (PRILOSEC) 40 MG capsule Take 1 capsule (40 mg total) by mouth 2 (two) times daily before a meal. Open capsule and place granules in water or applesauce to swallow 03/17/21   Gatha Mayer, MD  ondansetron (ZOFRAN-ODT) 4 MG disintegrating tablet Take 4 mg by  mouth every 8 (eight) hours as needed for vomiting or nausea. 11/04/20   [provider]  ondansetron (ZOFRAN-ODT) 8 MG disintegrating tablet Take 1 tablet (8 mg total) by mouth every 8 (eight) hours as needed for nausea or vomiting. 06/01/21   Dorie Rank, MD  orphenadrine (NORFLEX) 100 MG tablet Take 1 tablet (100 mg total) by mouth 2 (two) times daily. 09/05/21   Montine Circle, PA-C  oxyCODONE-acetaminophen (PERCOCET/ROXICET) 5-325 MG tablet Take 0.25 tablets by mouth 2 (two) times daily as needed (leg pain). 02/20/21   [provider]  oxyCODONE-acetaminophen (PERCOCET/ROXICET) 5-325 MG tablet Take 1 tablet by mouth every 6 (six) hours as needed for severe pain. Patient taking differently: Take 1 tablet by mouth daily as needed for severe pain. 06/01/21   Dorie Rank, MD  RENVELA 2.4 g PACK Take 2.4 g by mouth 3 (three) times daily. 05/04/21   [provider]  silver sulfADIAZINE (SILVADENE) 1 % cream Apply 1 application topically daily. Patient taking differently: Apply 1 application. topically daily as needed (wound care). 02/06/21   Henderly, Britni A, PA-C  simvastatin (ZOCOR) 20 MG tablet Take 20 mg by mouth at bedtime.    [provider]  trolamine salicylate (ASPERCREME/ALOE) 10 % cream Apply 1 application. topically as needed for muscle pain. 07/30/21   Blanchie Dessert, MD     Physical Exam: Vitals:   12/27/21 1700 12/27/21 1900 12/27/21 1930 12/27/21 2035  BP: (!) 196/92 136/75 123/77 (!) 147/97  Pulse: 93 82 80 87  Resp: 17 16 13 18   Temp:    98.6 F (37 C)  TempSrc:    Oral  SpO2: 96% 97% 95% 92%   Constitutional: NAD, calm, comfortable Eyes: PERRL, lids and conjunctivae normal ENMT: Mucous membranes are moist. Posterior pharynx clear of any exudate or lesions.Normal dentition.  Neck: normal, supple, no masses, no thyromegaly Respiratory: clear to auscultation bilaterally, no wheezing, no crackles. Normal respiratory effort. No accessory muscle use.  Cardiovascular: Regular rate and rhythm, no murmurs / rubs / gallops. No extremity edema. 2+ pedal pulses. No carotid bruits.  Abdomen: no tenderness, no masses palpated. No hepatosplenomegaly. Bowel sounds positive.  Musculoskeletal: no clubbing / cyanosis. No joint deformity upper and lower extremities. Good ROM, no contractures. Normal muscle tone.  Skin: no rashes, lesions, ulcers. No induration Neurologic: CN 2-12 grossly intact. Sensation intact, DTR normal. Strength 5/5 in all 4.  Psychiatric: Normal judgment and insight. Alert and oriented x 3. Normal mood.   Data Reviewed:    COVID positive      Latest Ref Rng & Units 12/26/2021    3:34 PM 12/26/2021   12:12 AM 10/02/2021    8:54 AM  CBC  WBC 4.0 - 10.5 K/uL 12.5  11.4  6.0   Hemoglobin 12.0 - 15.0 g/dL 12.5  12.4  10.8   Hematocrit 36.0 - 46.0 % 39.3  36.9  33.9   Platelets 150 - 400 K/uL 244  196  178       Latest Ref Rng & Units 12/27/2021    8:13 AM 12/26/2021    2:45 PM 12/26/2021   12:12 AM  CMP  Glucose 70 - 99 mg/dL 208  398  603   BUN 6 - 20 mg/dL 39  24  43   Creatinine 0.44 - 1.00 mg/dL 6.53  4.95  8.98   Sodium 135 - 145 mmol/L 139  138  137   Potassium 3.5 - 5.1 mmol/L 4.5  3.9  4.5  Chloride 98 - 111 mmol/L 99  91  89   CO2 22 - 32 mmol/L 29  27  28    Calcium 8.9 - 10.3 mg/dL 8.8  10.2  8.8   Total Protein  6.5 - 8.1 g/dL  9.7  9.2   Total Bilirubin 0.3 - 1.2 mg/dL  0.7  0.9   Alkaline Phos 38 - 126 U/L  55  65   AST 15 - 41 U/L  17  22   ALT 0 - 44 U/L  10  16     IMPRESSION: 1. No evidence of aortic aneurysm or dissection. 2. Patchy and consolidative opacities in both lung bases favored to reflect atelectasis given normal appearance of the lung bases on the CT abdomen/pelvis from 1 day prior; however, developing multifocal infection is not entirely excluded. 3. Unchanged mild wall thickening and inflammatory change about the cecum and rectum suspicious for nonspecific infectious or inflammatory colitis. 4. Coronary artery calcifications, accelerated for age. 5. Ill-defined hypodense liver lesions again seen for which nonemergent outpatient multiphase abdominal MRI with and without contrast is recommended for characterization.  Assessment and Plan: * GI bleeding Hematemesis and melena. HGBs yesterday were reassuring, but no HGB checked in over 24h now due to being at med center waiting for bed for extended period. Suspect acute GI symptoms (N/V/D, colitis on CT and ABD pain) may be secondary to COVID-19.  Pt does have underlying chronic diabetic gastroparesis as well.  Also h/o Grade D esophagitis in 2022. Check repeat HGB stat Continue PPI GTT Sent message to Dr. Hilarie Fredrickson for AM GI consult Depending on symptoms: clear liquid diet vs NPO Tele monitor Repeat CBC in AM  COVID-19 virus infection Suspect mild PNA on CT scan and colitis with GI symptoms secondary to COVID-19 COVID test was positive. COVID pathway Molnupiravir Daily labs No O2 requirement at present, symptoms seem to be primarily GI.  Uncontrolled type 1 diabetes mellitus with hyperglycemia (HCC) Sensitive SSI Q4H for the moment Lantus 5u QHS Anion gap in ED yesterday, resolved on BMP this AM.  Getting repeat stat BMP now to ensure no recurrent anion gap.  ESRD on hemodialysis [facility]) Nephrology called by  EDP. Getting stat BMP now to make sure she still has no emergent HD indications (none as of 8am this morning). Assuming still no emergent indications, call nephro in AM to let them know shes arrived to hospital finally.  Liver lesion Multiple liver lesions. Needs follow up MRI as outpt.  Essential hypertension, benign Cont home HTN meds when med rec completed.      Advance Care Planning:   Code Status: Full Code  Consults: LBGI  Family Communication: No family in room  Severity of Illness: The appropriate patient status for this patient is OBSERVATION. Observation status is judged to be reasonable and necessary in order to provide the required intensity of service to ensure the patient's safety. The patient's presenting symptoms, physical exam findings, and initial radiographic and laboratory data in the context of their medical condition is felt to place them at decreased risk for further clinical deterioration. Furthermore, it is anticipated that the patient will be medically stable for discharge from the hospital within 2 midnights of admission.   Author: Etta Quill., DO 12/27/2021 9:03 PM  For on call review [email or web address].

## 2021-12-27 NOTE — Assessment & Plan Note (Addendum)
Suspect mild PNA on CT scan and colitis with GI symptoms secondary to COVID-19 COVID test was positive. 1. COVID pathway 2. Molnupiravir 3. Daily labs 4. No O2 requirement at present, symptoms seem to be primarily GI.

## 2021-12-28 DIAGNOSIS — D631 Anemia in chronic kidney disease: Secondary | ICD-10-CM | POA: Diagnosis present

## 2021-12-28 DIAGNOSIS — Z794 Long term (current) use of insulin: Secondary | ICD-10-CM | POA: Diagnosis not present

## 2021-12-28 DIAGNOSIS — E785 Hyperlipidemia, unspecified: Secondary | ICD-10-CM | POA: Diagnosis present

## 2021-12-28 DIAGNOSIS — K2100 Gastro-esophageal reflux disease with esophagitis, without bleeding: Secondary | ICD-10-CM

## 2021-12-28 DIAGNOSIS — U071 COVID-19: Secondary | ICD-10-CM | POA: Diagnosis present

## 2021-12-28 DIAGNOSIS — R933 Abnormal findings on diagnostic imaging of other parts of digestive tract: Secondary | ICD-10-CM | POA: Diagnosis not present

## 2021-12-28 DIAGNOSIS — R1310 Dysphagia, unspecified: Secondary | ICD-10-CM | POA: Diagnosis not present

## 2021-12-28 DIAGNOSIS — E1022 Type 1 diabetes mellitus with diabetic chronic kidney disease: Secondary | ICD-10-CM | POA: Diagnosis present

## 2021-12-28 DIAGNOSIS — I10 Essential (primary) hypertension: Secondary | ICD-10-CM | POA: Diagnosis not present

## 2021-12-28 DIAGNOSIS — K449 Diaphragmatic hernia without obstruction or gangrene: Secondary | ICD-10-CM | POA: Diagnosis present

## 2021-12-28 DIAGNOSIS — Z992 Dependence on renal dialysis: Secondary | ICD-10-CM | POA: Diagnosis not present

## 2021-12-28 DIAGNOSIS — N186 End stage renal disease: Secondary | ICD-10-CM | POA: Diagnosis present

## 2021-12-28 DIAGNOSIS — A0839 Other viral enteritis: Secondary | ICD-10-CM | POA: Diagnosis present

## 2021-12-28 DIAGNOSIS — F819 Developmental disorder of scholastic skills, unspecified: Secondary | ICD-10-CM | POA: Diagnosis present

## 2021-12-28 DIAGNOSIS — G40909 Epilepsy, unspecified, not intractable, without status epilepticus: Secondary | ICD-10-CM | POA: Diagnosis present

## 2021-12-28 DIAGNOSIS — K920 Hematemesis: Secondary | ICD-10-CM

## 2021-12-28 DIAGNOSIS — Z79899 Other long term (current) drug therapy: Secondary | ICD-10-CM | POA: Diagnosis not present

## 2021-12-28 DIAGNOSIS — E1043 Type 1 diabetes mellitus with diabetic autonomic (poly)neuropathy: Secondary | ICD-10-CM | POA: Diagnosis present

## 2021-12-28 DIAGNOSIS — K2101 Gastro-esophageal reflux disease with esophagitis, with bleeding: Secondary | ICD-10-CM | POA: Diagnosis present

## 2021-12-28 DIAGNOSIS — Z8261 Family history of arthritis: Secondary | ICD-10-CM | POA: Diagnosis not present

## 2021-12-28 DIAGNOSIS — R1084 Generalized abdominal pain: Secondary | ICD-10-CM | POA: Diagnosis present

## 2021-12-28 DIAGNOSIS — D62 Acute posthemorrhagic anemia: Secondary | ICD-10-CM | POA: Diagnosis not present

## 2021-12-28 DIAGNOSIS — N2581 Secondary hyperparathyroidism of renal origin: Secondary | ICD-10-CM | POA: Diagnosis present

## 2021-12-28 DIAGNOSIS — E119 Type 2 diabetes mellitus without complications: Secondary | ICD-10-CM | POA: Diagnosis not present

## 2021-12-28 DIAGNOSIS — Z888 Allergy status to other drugs, medicaments and biological substances status: Secondary | ICD-10-CM | POA: Diagnosis not present

## 2021-12-28 DIAGNOSIS — K921 Melena: Secondary | ICD-10-CM | POA: Diagnosis not present

## 2021-12-28 DIAGNOSIS — K922 Gastrointestinal hemorrhage, unspecified: Secondary | ICD-10-CM | POA: Diagnosis present

## 2021-12-28 DIAGNOSIS — K3184 Gastroparesis: Secondary | ICD-10-CM | POA: Diagnosis present

## 2021-12-28 DIAGNOSIS — I120 Hypertensive chronic kidney disease with stage 5 chronic kidney disease or end stage renal disease: Secondary | ICD-10-CM | POA: Diagnosis present

## 2021-12-28 DIAGNOSIS — K769 Liver disease, unspecified: Secondary | ICD-10-CM | POA: Diagnosis present

## 2021-12-28 DIAGNOSIS — E1065 Type 1 diabetes mellitus with hyperglycemia: Secondary | ICD-10-CM | POA: Diagnosis not present

## 2021-12-28 DIAGNOSIS — K222 Esophageal obstruction: Secondary | ICD-10-CM | POA: Diagnosis present

## 2021-12-28 LAB — CBC WITH DIFFERENTIAL/PLATELET
Abs Immature Granulocytes: 0.08 10*3/uL — ABNORMAL HIGH (ref 0.00–0.07)
Basophils Absolute: 0 10*3/uL (ref 0.0–0.1)
Basophils Relative: 0 %
Eosinophils Absolute: 0.1 10*3/uL (ref 0.0–0.5)
Eosinophils Relative: 1 %
HCT: 27.7 % — ABNORMAL LOW (ref 36.0–46.0)
Hemoglobin: 8.8 g/dL — ABNORMAL LOW (ref 12.0–15.0)
Immature Granulocytes: 1 %
Lymphocytes Relative: 20 %
Lymphs Abs: 2.3 10*3/uL (ref 0.7–4.0)
MCH: 32.4 pg (ref 26.0–34.0)
MCHC: 31.8 g/dL (ref 30.0–36.0)
MCV: 101.8 fL — ABNORMAL HIGH (ref 80.0–100.0)
Monocytes Absolute: 0.6 10*3/uL (ref 0.1–1.0)
Monocytes Relative: 5 %
Neutro Abs: 8.3 10*3/uL — ABNORMAL HIGH (ref 1.7–7.7)
Neutrophils Relative %: 73 %
Platelets: 172 10*3/uL (ref 150–400)
RBC: 2.72 MIL/uL — ABNORMAL LOW (ref 3.87–5.11)
RDW: 15.4 % (ref 11.5–15.5)
WBC: 11.4 10*3/uL — ABNORMAL HIGH (ref 4.0–10.5)
nRBC: 0 % (ref 0.0–0.2)

## 2021-12-28 LAB — COMPREHENSIVE METABOLIC PANEL
ALT: 26 U/L (ref 0–44)
AST: 33 U/L (ref 15–41)
Albumin: 3.6 g/dL (ref 3.5–5.0)
Alkaline Phosphatase: 50 U/L (ref 38–126)
Anion gap: 13 (ref 5–15)
BUN: 53 mg/dL — ABNORMAL HIGH (ref 6–20)
CO2: 25 mmol/L (ref 22–32)
Calcium: 7.7 mg/dL — ABNORMAL LOW (ref 8.9–10.3)
Chloride: 97 mmol/L — ABNORMAL LOW (ref 98–111)
Creatinine, Ser: 8.42 mg/dL — ABNORMAL HIGH (ref 0.44–1.00)
GFR, Estimated: 6 mL/min — ABNORMAL LOW (ref >60)
Glucose, Bld: 133 mg/dL — ABNORMAL HIGH (ref 70–99)
Potassium: 3.9 mmol/L (ref 3.5–5.1)
Sodium: 135 mmol/L (ref 135–145)
Total Bilirubin: 0.5 mg/dL (ref 0.3–1.2)
Total Protein: 7.6 g/dL (ref 6.5–8.1)

## 2021-12-28 LAB — CBC
HCT: 29.7 % — ABNORMAL LOW (ref 36.0–46.0)
Hemoglobin: 9.7 g/dL — ABNORMAL LOW (ref 12.0–15.0)
MCH: 33.1 pg (ref 26.0–34.0)
MCHC: 32.7 g/dL (ref 30.0–36.0)
MCV: 101.4 fL — ABNORMAL HIGH (ref 80.0–100.0)
Platelets: 179 10*3/uL (ref 150–400)
RBC: 2.93 MIL/uL — ABNORMAL LOW (ref 3.87–5.11)
RDW: 15.3 % (ref 11.5–15.5)
WBC: 8.5 10*3/uL (ref 4.0–10.5)
nRBC: 0 % (ref 0.0–0.2)

## 2021-12-28 LAB — HEMOGLOBIN AND HEMATOCRIT, BLOOD
HCT: 27.9 % — ABNORMAL LOW (ref 36.0–46.0)
HCT: 25.4 % — ABNORMAL LOW (ref 36.0–46.0)
Hemoglobin: 9 g/dL — ABNORMAL LOW (ref 12.0–15.0)
Hemoglobin: 8.3 g/dL — ABNORMAL LOW (ref 12.0–15.0)

## 2021-12-28 LAB — PROCALCITONIN: Procalcitonin: 3.24 ng/mL

## 2021-12-28 LAB — HEPATITIS B SURFACE ANTIBODY,QUALITATIVE: Hep B S Ab: REACTIVE — AB

## 2021-12-28 LAB — RENAL FUNCTION PANEL
Albumin: 3.7 g/dL (ref 3.5–5.0)
Anion gap: 12 (ref 5–15)
BUN: 55 mg/dL — ABNORMAL HIGH (ref 6–20)
CO2: 24 mmol/L (ref 22–32)
Calcium: 7.3 mg/dL — ABNORMAL LOW (ref 8.9–10.3)
Chloride: 95 mmol/L — ABNORMAL LOW (ref 98–111)
Creatinine, Ser: 8.81 mg/dL — ABNORMAL HIGH (ref 0.44–1.00)
GFR, Estimated: 5 mL/min — ABNORMAL LOW (ref >60)
Glucose, Bld: 145 mg/dL — ABNORMAL HIGH (ref 70–99)
Phosphorus: 4.4 mg/dL (ref 2.5–4.6)
Potassium: 3.7 mmol/L (ref 3.5–5.1)
Sodium: 131 mmol/L — ABNORMAL LOW (ref 135–145)

## 2021-12-28 LAB — C-REACTIVE PROTEIN: CRP: 2.7 mg/dL — ABNORMAL HIGH (ref <1.0)

## 2021-12-28 LAB — D-DIMER, QUANTITATIVE: D-Dimer, Quant: 4.77 ug/mL-FEU — ABNORMAL HIGH (ref 0.00–0.50)

## 2021-12-28 LAB — TYPE AND SCREEN
ABO/RH(D): A POS
Antibody Screen: NEGATIVE

## 2021-12-28 LAB — HEPATITIS C ANTIBODY: HCV Ab: NONREACTIVE

## 2021-12-28 LAB — HEMOGLOBIN A1C
Hgb A1c MFr Bld: 9.2 % — ABNORMAL HIGH (ref 4.8–5.6)
Mean Plasma Glucose: 217.34 mg/dL

## 2021-12-28 LAB — GLUCOSE, CAPILLARY
Glucose-Capillary: 57 mg/dL — ABNORMAL LOW (ref 70–99)
Glucose-Capillary: 101 mg/dL — ABNORMAL HIGH (ref 70–99)
Glucose-Capillary: 129 mg/dL — ABNORMAL HIGH (ref 70–99)
Glucose-Capillary: 150 mg/dL — ABNORMAL HIGH (ref 70–99)
Glucose-Capillary: 207 mg/dL — ABNORMAL HIGH (ref 70–99)
Glucose-Capillary: 192 mg/dL — ABNORMAL HIGH (ref 70–99)
Glucose-Capillary: 145 mg/dL — ABNORMAL HIGH (ref 70–99)
Glucose-Capillary: 102 mg/dL — ABNORMAL HIGH (ref 70–99)

## 2021-12-28 LAB — HEPATITIS B CORE ANTIBODY, TOTAL: Hep B Core Total Ab: NONREACTIVE

## 2021-12-28 LAB — HEPATITIS B SURFACE ANTIGEN: Hepatitis B Surface Ag: NONREACTIVE

## 2021-12-28 LAB — PHOSPHORUS: Phosphorus: 4.9 mg/dL — ABNORMAL HIGH (ref 2.5–4.6)

## 2021-12-28 LAB — MAGNESIUM: Magnesium: 1.9 mg/dL (ref 1.7–2.4)

## 2021-12-28 MED ORDER — LIDOCAINE-PRILOCAINE 2.5-2.5 % EX CREA: 1.0000 | TOPICAL_CREAM | CUTANEOUS | Status: DC | PRN

## 2021-12-28 MED ORDER — CALCIUM ACETATE (PHOS BINDER) 667 MG PO CAPS: 667.0000 mg | ORAL_CAPSULE | ORAL | Status: DC

## 2021-12-28 MED ORDER — SEVELAMER CARBONATE 2.4 G PO PACK
2.4000 g | PACK | Freq: Three times a day (TID) | ORAL | Status: DC
  Administered 2021-12-29 – 2022-01-01 (×4): 2.4 g via ORAL
  Filled 2021-12-28 (×15): qty 1

## 2021-12-28 MED ORDER — PANTOPRAZOLE SODIUM 40 MG IV SOLR
40.0000 mg | Freq: Two times a day (BID) | INTRAVENOUS | Status: DC
  Administered 2021-12-28 – 2022-01-01 (×8): 40 mg via INTRAVENOUS
  Filled 2021-12-28 (×8): qty 10

## 2021-12-28 MED ORDER — SIMVASTATIN 20 MG PO TABS
20.0000 mg | ORAL_TABLET | Freq: Every day | ORAL | Status: DC
  Administered 2021-12-28 – 2021-12-31 (×4): 20 mg via ORAL
  Filled 2021-12-28 (×5): qty 1

## 2021-12-28 MED ORDER — FLUTICASONE PROPIONATE 50 MCG/ACT NA SUSP: 2.0000 | Freq: Every day | NASAL | Status: DC | PRN

## 2021-12-28 MED ORDER — LIDOCAINE HCL (PF) 1 % IJ SOLN: 5.0000 mL | INTRAMUSCULAR | Status: DC | PRN

## 2021-12-28 MED ORDER — ALTEPLASE 2 MG IJ SOLR: 2.0000 mg | Freq: Once | INTRAMUSCULAR | Status: DC | PRN

## 2021-12-28 MED ORDER — DARBEPOETIN ALFA 150 MCG/0.3ML IJ SOSY
150.0000 ug | PREFILLED_SYRINGE | INTRAMUSCULAR | Status: DC
  Administered 2021-12-29: 150 ug via INTRAVENOUS
  Filled 2021-12-28 (×2): qty 0.3

## 2021-12-28 MED ORDER — METRONIDAZOLE 500 MG/100ML IV SOLN
500.0000 mg | Freq: Two times a day (BID) | INTRAVENOUS | Status: DC
  Administered 2021-12-28 – 2021-12-31 (×8): 500 mg via INTRAVENOUS
  Filled 2021-12-28 (×8): qty 100

## 2021-12-28 MED ORDER — CIPROFLOXACIN IN D5W 400 MG/200ML IV SOLN
400.0000 mg | INTRAVENOUS | Status: DC
  Administered 2021-12-28 – 2021-12-31 (×4): 400 mg via INTRAVENOUS
  Filled 2021-12-28 (×4): qty 200

## 2021-12-28 MED ORDER — FAMOTIDINE 20 MG PO TABS: 40.0000 mg | ORAL_TABLET | Freq: Two times a day (BID) | ORAL | Status: DC | PRN

## 2021-12-28 MED ORDER — CHLORHEXIDINE GLUCONATE CLOTH 2 % EX PADS
6.0000 | MEDICATED_PAD | Freq: Every day | CUTANEOUS | Status: DC
  Administered 2021-12-28 – 2022-01-01 (×5): 6 via TOPICAL

## 2021-12-28 MED ORDER — PENTAFLUOROPROP-TETRAFLUOROETH EX AERO: 1.0000 | INHALATION_SPRAY | CUTANEOUS | Status: DC | PRN

## 2021-12-28 MED ORDER — BOOST / RESOURCE BREEZE PO LIQD CUSTOM
1.0000 | Freq: Three times a day (TID) | ORAL | Status: DC
  Administered 2021-12-28 – 2022-01-01 (×7): 1 via ORAL

## 2021-12-28 MED ORDER — HEPARIN SODIUM (PORCINE) 1000 UNIT/ML DIALYSIS: 1000.0000 [IU] | INTRAMUSCULAR | Status: DC | PRN

## 2021-12-28 MED ORDER — LIDOCAINE-PRILOCAINE 2.5-2.5 % EX CREA: 1.0000 | TOPICAL_CREAM | Freq: Every day | CUTANEOUS | Status: DC | PRN

## 2021-12-28 MED ORDER — ANTICOAGULANT SODIUM CITRATE 4% (200MG/5ML) IV SOLN
5.0000 mL | Status: DC | PRN
  Filled 2021-12-28: qty 5

## 2021-12-28 MED ORDER — SILVER SULFADIAZINE 1 % EX CREA: 1.0000 | TOPICAL_CREAM | Freq: Every day | CUTANEOUS | Status: DC | PRN

## 2021-12-28 MED ORDER — CALCIUM ACETATE (PHOS BINDER) 667 MG PO CAPS
1334.0000 mg | ORAL_CAPSULE | Freq: Three times a day (TID) | ORAL | Status: DC
  Administered 2021-12-28: 1334 mg via ORAL
  Filled 2021-12-28 (×4): qty 2

## 2021-12-28 NOTE — Care Management Obs Status (Signed)
[place] NOTIFICATION   Patient Details  Name: NORLEEN XIE MRN: [id number] Date of Birth: 08-05-1985   Medicare Observation Status Notification Given:  Yes    Sharin Mons, RN 12/28/2021, 10:39 AM

## 2021-12-28 NOTE — Consult Note (Addendum)
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 abnormality or suspicious osseous lesion. Review of the MIP images confirms the above findings. IMPRESSION: 1. No evidence of aortic aneurysm or dissection. 2. Patchy and consolidative opacities in both lung bases favored to reflect atelectasis given normal appearance of the lung bases on the CT abdomen/pelvis from 1 day prior; however, developing multifocal infection is not entirely excluded. 3.  Unchanged mild wall thickening and inflammatory change about the cecum and rectum suspicious for nonspecific infectious or inflammatory colitis. 4. Coronary artery calcifications, accelerated for age. 5. Ill-defined hypodense liver lesions again seen for which nonemergent outpatient multiphase abdominal MRI with and without contrast is recommended for characterization. Electronically Signed   By: Valetta Mole M.D.   On: 12/27/2021 13:37   DG Chest Portable 1 View  Result Date: 12/27/2021 CLINICAL DATA:  36 year old female with history of severe chest pain. History of sickle cell disease. Shortness of breath. EXAM: PORTABLE CHEST 1 VIEW COMPARISON:  Chest x-ray 09/28/2021. FINDINGS: Lung volumes are low. Ill-defined opacity at the left base which may reflect atelectasis and/or consolidation. Mild blunting of the left costophrenic sulcus concerning for a small left pleural effusion. Right lung is clear. No right pleural effusion. No pneumothorax. No evidence of pulmonary edema. Heart size is normal. Upper mediastinal contours are within normal limits. IMPRESSION: 1. Low lung volumes with atelectasis and/or consolidation in the left lung base and probable small left pleural effusion. Electronically Signed   By: Vinnie Langton M.D.   On: 12/27/2021 08:58   CT ABDOMEN PELVIS WO CONTRAST  Result Date: 12/26/2021 CLINICAL DATA:  Abdominal pain, vomiting, hemodialysis today EXAM: CT ABDOMEN AND PELVIS WITHOUT CONTRAST TECHNIQUE: Multidetector CT imaging of the abdomen and pelvis was performed following the standard protocol without IV contrast. RADIATION DOSE REDUCTION: This exam was performed according to the departmental dose-optimization program which includes automated exposure control, adjustment of the mA and/or kV according to patient size and/or use of iterative reconstruction technique. COMPARISON:  06/10/2021 05/30/2021, 02/10/2021, numerous additional prior examination FINDINGS: Lower chest: No acute  abnormality. Hepatobiliary: Multiple somewhat ill-defined hypodense lesions throughout the liver, for example in the anterior liver dome, hepatic segment VIII (series 2, image 8), in the central right lobe of the liver, segment VII/VIII, (series 2, image 12), in the inferior right lobe of the liver segment VI (series 2, image 28), and in the left lobe, hepatic segment II (series 2, image 14). Status post cholecystectomy. No biliary ductal dilatation. Pancreas: Unremarkable. No pancreatic ductal dilatation or surrounding inflammatory changes. Spleen: Normal in size without significant abnormality. Adrenals/Urinary Tract: Adrenal glands are unremarkable. Right kidney is absent. Left kidney is severely atrophic. No hydronephrosis. Bladder is unremarkable. Stomach/Bowel: Stomach  is within normal limits. Appendix appears normal. Circumferential wall thickening and mild fat stranding about the cecum, with significant fibrofatty mural stratification (series 2, image 38). Probable additional circumferential wall thickening and fat stranding about the rectum (series 6, image 65). Vascular/Lymphatic: No significant vascular findings are present. No enlarged abdominal or pelvic lymph nodes. Reproductive: No mass or other significant abnormality. Other: No abdominal wall hernia or abnormality. No ascites. 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 Musculoskeletal: No acute or significant osseous findings. IMPRESSION: 1. Circumferential wall thickening and mild fat stranding about the cecum, with significant fibrofatty mural stratification. Probable additional circumferential wall thickening and fat stranding about the rectum. Findings are consistent with nonspecific infectious or inflammatory colitis. 2. Multiple somewhat ill-defined hypodense lesions throughout the liver. These are incompletely characterized on this noncontrast examination  however are similar to multiple prior examinations and possibly reflecting hemangiomata or other benign lesions such as focal nodular hyperplasias. As previously reported, recommend nonemergent multiphasic contrast enhanced CT or MRI to fully characterize. 3. Right kidney is absent. Left kidney is severely atrophic. 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 Electronically Signed   By: Delanna Ahmadi M.D.   On: 12/26/2021 16:11      IMPRESSION:     Acute flare chronic, refractory n/v, abd pain w/ hematemesis, dark stools and FOBT +.  CT w colitis at cecum, rectum.  Multiple EGDs and dilations of refractory esoph strictures but no prior colnoscopy.  Dx of diabetic gastroparesis not confirmed at [facility] h GES in 04/2021.      ESRD.  Failed renal transplant.      IDDM   Elevated I stat HCG. Sexually active and does not practice birth control.  Though charts, radiology reports mention hysterectomy, pt denies this.  Uterus absent on CT.     Covid 19 positive.  CXR: atx and/or consolidation LL base, w likley small effusion.  CT chest: bb atx, possible developinig multifocal pna.      PLAN:      Check serum HCG  Plan EGD and flexible sigmoidoscopy tomorrow, though plans may change if confirmatory HCG is positive.  Will give a tapwater enema prior to procedures.  NPO after midnight.  Continue clear liquids.  Switch from PPI drip to IV twice daily.   Azucena Freed  12/28/2021, 8:22 AM Phone [phone]    Attending Physician Note   I have taken a history, reviewed the chart and examined the patient. I performed a substantive portion of this encounter, including complete performance of at least one of the key components, in conjunction with the APP. I agree with the APP's note, impression and recommendations with my edits. My additional impressions and recommendations are as follows.   Acute on chronic N/V with hematemesis,  dark FOBT + stools, mild dysphagia and thickened cecum, rectum on CT. History of refractory esophagitis and esophageal strictures with recent evaluation at [facility] with plans for GES and esophageal manometry. Covid-19 positive with possible gastroenteritis and possible early PNA.    *PPI IV bid *EGD, Flex sigmoidoscopy tomorrow. She cannot tolerate a bowel prep for colonoscopy at this time *Continue outpatient evaluation with [facility] as planned   Lucio Edward, MD [facility] See [facility], [facility], for our on call provider

## 2021-12-28 NOTE — Plan of Care (Signed)
  Problem: Health Behavior/Discharge Planning: Goal: Ability to manage health-related needs will improve Outcome: Progressing   Problem: Clinical Measurements: Goal: Respiratory complications will improve Outcome: Progressing   Problem: Activity: Goal: Risk for activity intolerance will decrease Outcome: Progressing   Problem: Nutrition: Goal: Adequate nutrition will be maintained Outcome: Progressing   Problem: Coping: Goal: Level of anxiety will decrease Outcome: Progressing   Problem: Elimination: Goal: Will not experience complications related to bowel motility Outcome: Progressing   Problem: Pain Managment: Goal: General experience of comfort will improve Outcome: Progressing

## 2021-12-28 NOTE — Consult Note (Signed)
[facility] Renal Consultation Note    Indication for Consultation:  Management of ESRD/hemodialysis; anemia, hypertension/volume and secondary hyperparathyroidism  AJO:INOMVEH, Christean Grief, MD  HPI: [name] is a 36 y.o. female with ESRD on HD TTS at [facility].  Past medical history significant for DM, pseudoseizures, gastroparesis, esophagitis, and failed kidney transplant.   Pt presented to [facility] ED due to hematemesis and melena. Hemoccult  positive. She was incidentally found to be Covid positive as well. Ct showed PNA and colitis. Hbg was 12.2 but has dropped to 8.8 this morning. Pt transferred to [facility] for GI consult for bleeding. Last completed full dialysis on 12/26/2021, and was 2.2 kg under dry weight at completion of treatment. Labs this AM Scr 8.42, BUN 53, CO2 25, K 3.9, Ca 7.7, K 3.0, Ca 7.7, Phos 4.9, alb 3.6, and Hgb 8.9.   Pt resting comfortably in bed today when seen. Has continued complaints of abdominal pain but overall reports she is doing alright. Denies nausea/vomiting, chest pain, or lower extremity edema.    Past Medical History:  Diagnosis Date   Anemia    Blood transfusion without reported diagnosis    Cellulitis of left leg 03/01/2018   Chronic kidney disease    kidney transplant 07   Diabetes mellitus    Pt reports diagnosis in June 2011, Type 2   Diabetes mellitus without complication ([place])    Esophageal obstruction due to food impaction    GERD (gastroesophageal reflux disease)    Hyperlipidemia    Hypertension    Intra-abdominal abscess ([place]) 10/28/2018   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   LEARNING DISABILITY 09/25/2007   Qualifier: Diagnosis of  By: Deborra Medina MD, Talia     Prolonged Q-T interval on ECG    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   Pyelonephritis 06/23/2014   Renal and perinephric abscess 11/01/2018   Renal disorder    Seasonal allergies    Seizures ([place])    UTI (urinary tract infection) 01/09/2015   XXX SYNDROME  11/19/2008   Qualifier: Diagnosis of  By: Carlena Sax  MD, Colletta [place]     Past Surgical History:  Procedure Laterality Date   ARTERIOVENOUS GRAFT PLACEMENT Bilateral    "neither work" (10/24/2017)   AV FISTULA PLACEMENT Left 10/26/2018   Procedure: CREATION OF ARTERIOVENOUS FISTULA  LEFT ARM;  Surgeon: [name], MD;  Location: [place];  Service: Vascular;  Laterality: Left;   AV FISTULA PLACEMENT Left 05/23/2020   Procedure: LEFT ARM ARTERIOVENOUS (AV) FISTULA CREATION;  Surgeon: [name], MD;  Location: [place];  Service: Vascular;  Laterality: Left;   BALLOON DILATION N/A 01/19/2021   Procedure: Larrie Kass DILATION;  Surgeon: Gatha Mayer, MD;  [facility];  Service: Endoscopy;  Laterality: N/A;   BALLOON DILATION N/A 02/10/2021   Procedure: BALLOON DILATION;  Surgeon: Irene Shipper, MD;  [facility];  Service: Endoscopy;  Laterality: N/A;   BALLOON DILATION N/A 02/19/2021   Procedure: BALLOON DILATION;  Surgeon: Sharyn Creamer, MD;  Location[facility];  Service: Gastroenterology;  Laterality: N/A;   BASCILIC VEIN TRANSPOSITION Left 12/21/2018   Procedure: Left arm BASILIC VEIN TRANSPOSITION SECOND STAGE;  Surgeon: [name], MD;  Location: [place];  Service: Vascular;  Laterality: Left;   CHOLECYSTECTOMY N/A 06/30/2017   Procedure: LAPAROSCOPIC CHOLECYSTECTOMY WITH INTRAOPERATIVE CHOLANGIOGRAM;  Surgeon: Excell Seltzer, MD;  [facility];  Service: General;  Laterality: N/A;   ESOPHAGOGASTRODUODENOSCOPY (EGD) WITH PROPOFOL N/A 07/04/2017   Procedure:  ESOPHAGOGASTRODUODENOSCOPY (EGD) WITH PROPOFOL;  Surgeon: Clarene Essex, MD;  [facility];  Service: Endoscopy;  Laterality: N/A;   ESOPHAGOGASTRODUODENOSCOPY (EGD) WITH PROPOFOL N/A 08/10/2020   Procedure: ESOPHAGOGASTRODUODENOSCOPY (EGD) WITH PROPOFOL;  Surgeon: Doran Stabler, MD;  Location: [place];  Service: Gastroenterology;  Laterality: N/A;   ESOPHAGOGASTRODUODENOSCOPY  (EGD) WITH PROPOFOL N/A 01/19/2021   Procedure: ESOPHAGOGASTRODUODENOSCOPY (EGD) WITH PROPOFOL;  Surgeon: Gatha Mayer, MD;  [facility];  Service: Endoscopy;  Laterality: N/A;  WITH FLUOROSCOPY AND DILATION   ESOPHAGOGASTRODUODENOSCOPY (EGD) WITH PROPOFOL N/A 02/03/2021   Procedure: ESOPHAGOGASTRODUODENOSCOPY (EGD) WITH PROPOFOL;  Surgeon: Gatha Mayer, MD;  [facility];  Service: Endoscopy;  Laterality: N/A;   ESOPHAGOGASTRODUODENOSCOPY (EGD) WITH PROPOFOL N/A 02/10/2021   Procedure: ESOPHAGOGASTRODUODENOSCOPY (EGD) WITH PROPOFOL;  Surgeon: Irene Shipper, MD;  [facility];  Service: Endoscopy;  Laterality: N/A;  Balloon Dilation   ESOPHAGOGASTRODUODENOSCOPY (EGD) WITH PROPOFOL N/A 02/19/2021   Procedure: ESOPHAGOGASTRODUODENOSCOPY (EGD) WITH PROPOFOL;  Surgeon: Sharyn Creamer, MD;  [facility];  Service: Gastroenterology;  Laterality: N/A;   ESOPHAGOGASTRODUODENOSCOPY (EGD) WITH PROPOFOL N/A 03/12/2021   Procedure: ESOPHAGOGASTRODUODENOSCOPY (EGD) WITH PROPOFOL;  Surgeon: Lavena Bullion, DO;  [facility];  Service: Gastroenterology;  Laterality: N/A;   FOREIGN BODY REMOVAL N/A 02/03/2021   Procedure: FOREIGN BODY REMOVAL;  Surgeon: Gatha Mayer, MD;  [facility];  Service: Endoscopy;  Laterality: N/A;   INSERTION OF DIALYSIS CATHETER N/A 03/20/2018   Procedure: INSERTION OF TUNNELED DIALYSIS CATHETER - RIGHT INTERANL JUGULAR PLACEMENT;  Surgeon: Angelia Mould, MD;  Location: [place];  Service: Vascular;  Laterality: N/A;   IR FLUORO GUIDE CV LINE RIGHT  04/18/2020   IR GUIDED DRAIN W CATHETER PLACEMENT  10/28/2018   KIDNEY TRANSPLANT  2007   KIDNEY TRANSPLANT Right    PARATHYROIDECTOMY  ?2012   "3/4 removed" (10/24/2017)   RENAL BIOPSY Bilateral 2003   REVISON OF ARTERIOVENOUS FISTULA Left 09/24/2020   Procedure: LEFT UPPER ARM ARTERIOVENOUS GRAFT CREATION;  Surgeon: [name], MD;  Location: [place];   Service: Vascular;  Laterality: Left;   UPPER EXTREMITY VENOGRAPHY Bilateral 10/19/2018   Procedure: UPPER EXTREMITY VENOGRAPHY;  Surgeon: [name], MD;  Location: [place] [facility];  Service: Cardiovascular;  Laterality: Bilateral;  Bilateral    UPPER EXTREMITY VENOGRAPHY Left 09/04/2020   Procedure: UPPER EXTREMITY VENOGRAPHY - Left Upper;  Surgeon: [name], MD;  Location: [place] [facility];  Service: Cardiovascular;  Laterality: Left;   Family History  Problem Relation Age of Onset   Arthritis Mother    Hypertension Mother    Aneurysm Mother        died of brain aneurysm   CAD Father        Has 3 stents   Diabetes Father        borderline   Early death Brother        Died in war   Colon cancer Neg Hx    Esophageal cancer Neg Hx    Rectal cancer Neg Hx    Stomach cancer Neg Hx    Social History:  reports that she has never smoked. She has never been exposed to tobacco smoke. She has never used smokeless tobacco. She reports that she does not currently use alcohol. She reports that she does not use drugs. Allergies  Allergen Reactions   Diphenhydramine Anaphylaxis   Motrin [Ibuprofen] Shortness Of Breath and Itching   Contrast Media [Iodinated Contrast  Media] Itching   Peanut-Containing Drug Products Itching    Able to tolerate when cooked in foods   Shellfish Allergy Hives   Banana Itching, Nausea And Vomiting and Other (See Comments)    Sick on the stomach   Chlorhexidine Itching   Ferrous Sulfate Itching   Iron Dextran Itching and Other (See Comments)    Vein irritation    Prior to Admission medications   Medication Sig Start Date End Date Taking? Authorizing Provider  acetaminophen (TYLENOL) 500 MG tablet Take 500-1,000 mg by mouth every 6 (six) hours as needed for moderate pain.   Yes [provider]  calcium acetate (PHOSLO) 667 MG capsule Take 1,334 mg by mouth 3 (three) times daily with meals. Take 1-2 capsules (462-8638 mg) by  mouth with snacks & take 2 capsules (1334 mg) by mouth with each meal 10/26/20  Yes [provider]  Calcium Carbonate Antacid (CALCIUM CARBONATE, DOSED IN MG ELEMENTAL CALCIUM,) 1250 MG/5ML SUSP Take 500 mg of elemental calcium by mouth daily. 10/22/20  Yes [provider]  cetirizine (ZYRTEC) 10 MG tablet Take 10 mg by mouth daily as needed for allergies.   Yes [provider]  famotidine (PEPCID) 40 MG tablet TAKE 1 TABLET BY MOUTH TWICE DAILY IN THE MORNING AND AT BEDTIME Patient taking differently: Take 40 mg by mouth 2 (two) times daily as needed for heartburn or indigestion. 03/17/21  Yes Gatha Mayer, MD  fluticasone [facility]) 50 MCG/ACT nasal spray Place 2 sprays into both nostrils daily as needed for allergies. 09/03/18  Yes [name], MD  gabapentin (NEURONTIN) 400 MG capsule Take 800 mg by mouth daily as needed (pain). 12/30/20  Yes [provider]  hydrOXYzine (ATARAX/VISTARIL) 50 MG tablet Take 50 mg by mouth 2 (two) times daily as needed for itching. 11/03/20  Yes [provider]  insulin aspart (NOVOLOG FLEXPEN) 100 UNIT/ML FlexPen Inject 0-15 Units into the skin in the morning, at noon, in the evening, and at bedtime. Sliding Scale insulin   Yes [provider]  insulin degludec (TRESIBA) 100 UNIT/ML FlexTouch Pen Inject 20 Units into the skin daily. Patient taking differently: Inject 5-10 Units into the skin daily. 02/15/20  Yes Dwyane Dee, MD  lidocaine-prilocaine (EMLA) cream Apply 1 application topically daily as needed [facility]). 12/17/20  Yes [provider]  loperamide (IMODIUM) 2 MG capsule Take 2 mg by mouth as needed for diarrhea or loose stools. 09/11/20  Yes [provider]  metoprolol tartrate (LOPRESSOR) 25 MG tablet Take 1 tablet (25 mg total) by mouth 2 (two) times daily. Patient taking differently: Take 25 mg by mouth daily as needed (for systolic blood pressure or 135 or above). 02/15/20  Yes  Dwyane Dee, MD  omeprazole (PRILOSEC) 40 MG capsule Take 1 capsule (40 mg total) by mouth 2 (two) times daily before a meal. Open capsule and place granules in water or applesauce to swallow 03/17/21  Yes Gatha Mayer, MD  ondansetron (ZOFRAN-ODT) 8 MG disintegrating tablet Take 1 tablet (8 mg total) by mouth every 8 (eight) hours as needed for nausea or vomiting. 06/01/21  Yes Dorie Rank, MD  oxyCODONE-acetaminophen (PERCOCET/ROXICET) 5-325 MG tablet Take 1 tablet by mouth every 6 (six) hours as needed for severe pain. Patient taking differently: Take 1 tablet by mouth daily as needed for severe pain. 06/01/21  Yes Dorie Rank, MD  RENVELA 2.4 g PACK Take 2.4 g by mouth 3 (three) times daily. 05/04/21  Yes [provider]  silver sulfADIAZINE (SILVADENE) 1 % cream Apply 1 application topically daily. Patient taking differently: Apply 1 application  topically daily as needed (wound care). 02/06/21  Yes Henderly, Britni A, PA-C  simvastatin (ZOCOR) 20 MG tablet Take 20 mg by mouth at bedtime.   Yes [provider]  tiZANidine (ZANAFLEX) 2 MG tablet Take 2 mg by mouth 2 (two) times daily as needed for muscle spasms. 11/18/21  Yes [provider]  trolamine salicylate (ASPERCREME/ALOE) 10 % cream Apply 1 application. topically as needed for muscle pain. 07/30/21  Yes Plunkett, Loree Fee, MD  ACCU-CHEK SOFTCLIX LANCETS lancets Use to check blood sugar 4 times per day. 12/29/15   Renato Shin, MD  Insulin Pen Needle (BD PEN NEEDLE NANO 2ND GEN) 32G X 4 MM MISC 1 each by Does not apply route in the morning, at noon, in the evening, and at bedtime. 08/10/20   Barb Merino, MD  lidocaine (LIDODERM) 5 % Place 1 patch onto the skin daily. Remove & Discard patch within 12 hours or as directed by MD Patient not taking: Reported on 12/28/2021 08/06/21   Suella Broad A, PA-C  sucralfate (CARAFATE) 1 GM/10ML suspension Take 10 mLs (1 g total) by mouth 4 (four) times daily Take 2 teaspoons  (10 mL) 1 hour before meals and at bedtime. Do not take other medicines for 1 hour before or 1 hour after taking Patient not taking: Reported on 12/28/2021 12/16/21   [provider]   Current Facility-Administered Medications  Medication Dose Route Frequency Provider Last Rate Last Admin   acetaminophen (TYLENOL) tablet 650 mg  650 mg Oral Q6H PRN Etta Quill, DO       alteplase (CATHFLO ACTIVASE) injection 2 mg  2 mg Intracatheter Once PRN Safari Cinque, Ria Comment, PA       anticoagulant sodium citrate solution 5 mL  5 mL Intracatheter PRN Smrithi Pigford, Ria Comment, PA       calcium acetate (PHOSLO) capsule 1,334 mg  1,334 mg Oral TID WC Etta Quill, DO   1,334 mg at 12/28/21 1053   calcium acetate (PHOSLO) capsule 667 mg  667 mg Oral With snacks Darliss Cheney, MD       Chlorhexidine Gluconate Cloth 2 % PADS 6 each  6 each Topical Q0600 Gwynevere Lizana, Ria Comment, PA   6 each at 12/28/21 1048   ciprofloxacin (CIPRO) IVPB 400 mg  400 mg Intravenous Q24H Darliss Cheney, MD       [START ON 12/29/2021] Darbepoetin Alfa (ARANESP) injection 150 mcg  150 mcg Intravenous Q Tue-HD Tavia Stave, PA       dextrose 50 % solution 0-50 mL  0-50 mL Intravenous PRN Fredia Sorrow, MD       fluticasone (FLONASE) 50 MCG/ACT nasal spray 2 spray  2 spray Each Nare Daily PRN Etta Quill, DO       heparin injection 1,000 Units  1,000 Units Intracatheter PRN Ethelyne Erich, Ria Comment, [place]       HYDROmorphone (DILAUDID) injection 0.5 mg  0.5 mg Intravenous Q4H PRN Etta Quill, DO   0.5 mg at 12/28/21 1348   hydrOXYzine (ATARAX) tablet 25 mg  25 mg Oral TID PRN Etta Quill, DO   25 mg at 12/27/21 2311   insulin aspart (novoLOG) injection 0-9 Units  0-9 Units Subcutaneous Q4H Jennette Kettle M, DO   2 Units at 12/28/21 1348   insulin glargine-yfgn (SEMGLEE) injection 5 Units  5 Units Subcutaneous QHS Etta Quill, DO   5 Units at 12/27/21  2314   labetalol (NORMODYNE) injection 20 mg  20 mg Intravenous Q10  min PRN Regan Lemming, MD   20 mg at 12/27/21 1710   lidocaine (PF) (XYLOCAINE) 1 % injection 5 mL  5 mL Intradermal PRN Gerad Cornelio, Ria Comment, PA       lidocaine-prilocaine (EMLA) cream 1 Application  1 Application Topical PRN Devika Dragovich, Ria Comment, PA       metroNIDAZOLE (FLAGYL) IVPB 500 mg  500 mg Intravenous Q12H Darliss Cheney, MD 100 mL/hr at 12/28/21 1059 500 mg at 12/28/21 1059   molnupiravir EUA (LAGEVRIO) capsule 800 mg  4 capsule Oral BID Etta Quill, DO   800 mg at 12/28/21 1054   ondansetron (ZOFRAN) tablet 4 mg  4 mg Oral Q6H PRN Etta Quill, DO       Or   ondansetron [facility]) injection 4 mg  4 mg Intravenous Q6H PRN Etta Quill, DO       pantoprazole (PROTONIX) injection 40 mg  40 mg Intravenous Q12H Gribbin, Sarah J, PA-C       pentafluoroprop-tetrafluoroeth (GEBAUERS) aerosol 1 Application  1 Application Topical PRN Kailen Name, [place], [place]       sevelamer carbonate (RENVELA) powder PACK 2.4 g  2.4 g Oral TID with meals Jennette Kettle M, DO       silver sulfADIAZINE (SILVADENE) 1 % cream 1 Application  1 Application Topical Daily PRN Etta Quill, DO       simvastatin (ZOCOR) tablet 20 mg  20 mg Oral QHS Etta Quill, DO       Labs: Basic Metabolic Panel: Recent Labs  Lab 12/27/21 0813 12/28/21 0314 12/28/21 1007  NA 139 135 131*  K 4.5 3.9 3.7  CL 99 97* 95*  CO2 29 25 24   GLUCOSE 208* 133* 145*  BUN 39* 53* 55*  CREATININE 6.53* 8.42* 8.81*  CALCIUM 8.8* 7.7* 7.3*  PHOS  --  4.9* 4.4   Liver Function Tests: Recent Labs  Lab 12/26/21 0012 12/26/21 1445 12/28/21 0314 12/28/21 1007  AST 22 17 33  --   ALT 16 10 26   --   ALKPHOS 65 55 50  --   BILITOT 0.9 0.7 0.5  --   PROT 9.2* 9.7* 7.6  --   ALBUMIN 4.3 4.7 3.6 3.7   Recent Labs  Lab 12/26/21 0012 12/26/21 1445  LIPASE 69* 47   CBC: Recent Labs  Lab 12/26/21 0012 12/26/21 1534 12/28/21 0314 12/28/21 1007 12/28/21 1312  WBC 11.4* 12.5* 11.4*  --  8.5  NEUTROABS 9.8* 10.1*  8.3*  --   --   HGB 12.4 12.5 8.8* 9.0* 9.7*  HCT 36.9 39.3 27.7* 27.9* 29.7*  MCV 99.5 102.3* 101.8*  --  101.4*  PLT 196 244 172  --  179    CBG: Recent Labs  Lab 12/27/21 2357 12/28/21 0516 12/28/21 0607 12/28/21 0832 12/28/21 1201  GLUCAP 312* 26* 101* 129* 150*   Studies/Results: CT Angio Chest/Abd/Pel for Dissection W and/or Wo Contrast  Result Date: 12/27/2021 CLINICAL DATA:  Acute aortic syndrome suspected EXAM: CT ANGIOGRAPHY CHEST, ABDOMEN AND PELVIS TECHNIQUE: Non-contrast CT of the chest was initially obtained. Multidetector CT imaging through the chest, abdomen and pelvis was performed using the standard protocol during bolus administration of intravenous contrast. Multiplanar reconstructed images and MIPs were obtained and reviewed to evaluate the vascular anatomy. RADIATION DOSE REDUCTION: This exam was performed according to the departmental dose-optimization program which includes automated exposure control, adjustment of the mA and/or  kV according to patient size and/or use of iterative reconstruction technique. CONTRAST:  123mL OMNIPAQUE IOHEXOL 350 MG/ML SOLN COMPARISON:  Same day chest radiograph, CT abdomen/pelvis 1 day prior FINDINGS: CTA CHEST FINDINGS Cardiovascular: There is no evidence of acute intramural hematoma on the initial noncontrast images. There is no evidence of aortic aneurysm or dissection of the postcontrast images. There is no evidence of pulmonary embolism. The heart size is normal. Coronary artery calcifications are seen, accelerated for age. Mediastinum/Nodes: The thyroid is unremarkable. The esophagus is grossly unremarkable. There is a prominent precarinal lymph node measuring up to 1.3 cm in the axial plane, also present in the PET-CT from 2010 and only minimally increased in size, nonspecific. There is no mediastinal, hilar, or axillary lymphadenopathy. Lungs/Pleura: The trachea and central airways are patent. There are new patchy and consolidative  opacities in both lower lobes not present on the CT abdomen/pelvis from 1 day prior. The upper lobes and right middle lobe are well-aerated. There is no pulmonary edema. There is no pleural effusion or pneumothorax. There are no suspicious nodules. Musculoskeletal: There is no acute osseous abnormality or suspicious osseous lesion. Review of the MIP images confirms the above findings. CTA ABDOMEN AND PELVIS FINDINGS VASCULAR Aorta: Normal caliber aorta without aneurysm, dissection, vasculitis or significant stenosis. Celiac: Patent without evidence of aneurysm, dissection, vasculitis or significant stenosis. SMA: Patent without evidence of aneurysm, dissection, vasculitis or significant stenosis. Renals: A right renal artery is not identified. A markedly diminutive left renal artery is seen. There is corresponding marked left renal atrophy. IMA: Patent without evidence of aneurysm, dissection, vasculitis or significant stenosis. Inflow: Patent without evidence of aneurysm, dissection, vasculitis or significant stenosis. Veins: No obvious venous abnormality within the limitations of this arterial phase study. Review of the MIP images confirms the above findings. NON-VASCULAR Hepatobiliary: Ill-defined hypodense lesions are again seen in the liver (for example 5-89, 5-96), incompletely characterized. There is no acute abnormality in the liver. The gallbladder is surgically absent. There is no biliary ductal dilatation. Pancreas: Unremarkable. Spleen: Unremarkable. Adrenals/Urinary Tract: The adrenals are unremarkable. The right kidney is absent. The markedly atrophic left kidney is unchanged. There are no focal lesions, stones, or hydroureteronephrosis. The bladder is decompressed. Stomach/Bowel: The stomach is unremarkable. There is no evidence of bowel obstruction. Mild wall thickening with mild fat stranding around the cecum is similar to the study from 1 day prior. Suspected wall thickening and mild surrounding  inflammatory changes are also again seen around the rectum, also not significantly changed. There is no new or progressive abnormal bowel wall thickening or inflammatory change. Appendix is normal. Lymphatic: There is no abdominopelvic lymphadenopathy. Reproductive: Uterus is surgically absent. There is no adnexal mass. Other: Fibrotic scarring in the right lower quadrant is unchanged. There is no ascites or free air. Musculoskeletal: There is no acute osseous abnormality or suspicious osseous lesion. Review of the MIP images confirms the above findings. IMPRESSION: 1. No evidence of aortic aneurysm or dissection. 2. Patchy and consolidative opacities in both lung bases favored to reflect atelectasis given normal appearance of the lung bases on the CT abdomen/pelvis from 1 day prior; however, developing multifocal infection is not entirely excluded. 3. Unchanged mild wall thickening and inflammatory change about the cecum and rectum suspicious for nonspecific infectious or inflammatory colitis. 4. Coronary artery calcifications, accelerated for age. 5. Ill-defined hypodense liver lesions again seen for which nonemergent outpatient multiphase abdominal MRI with and without contrast is recommended for characterization. Electronically Signed   By:  Valetta Mole M.D.   On: 12/27/2021 13:37   DG Chest Portable 1 View  Result Date: 12/27/2021 CLINICAL DATA:  36 year old female with history of severe chest pain. History of sickle cell disease. Shortness of breath. EXAM: PORTABLE CHEST 1 VIEW COMPARISON:  Chest x-ray 09/28/2021. FINDINGS: Lung volumes are low. Ill-defined opacity at the left base which may reflect atelectasis and/or consolidation. Mild blunting of the left costophrenic sulcus concerning for a small left pleural effusion. Right lung is clear. No right pleural effusion. No pneumothorax. No evidence of pulmonary edema. Heart size is normal. Upper mediastinal contours are within normal limits. IMPRESSION:  1. Low lung volumes with atelectasis and/or consolidation in the left lung base and probable small left pleural effusion. Electronically Signed   By: Vinnie Langton M.D.   On: 12/27/2021 08:58   CT ABDOMEN PELVIS WO CONTRAST  Result Date: 12/26/2021 CLINICAL DATA:  Abdominal pain, vomiting, hemodialysis today EXAM: CT ABDOMEN AND PELVIS WITHOUT CONTRAST TECHNIQUE: Multidetector CT imaging of the abdomen and pelvis was performed following the standard protocol without IV contrast. RADIATION DOSE REDUCTION: This exam was performed according to the departmental dose-optimization program which includes automated exposure control, adjustment of the mA and/or kV according to patient size and/or use of iterative reconstruction technique. COMPARISON:  06/10/2021 05/30/2021, 02/10/2021, numerous additional prior examination FINDINGS: Lower chest: No acute abnormality. Hepatobiliary: Multiple somewhat ill-defined hypodense lesions throughout the liver, for example in the anterior liver dome, hepatic segment VIII (series 2, image 8), in the central right lobe of the liver, segment VII/VIII, (series 2, image 12), in the inferior right lobe of the liver segment VI (series 2, image 28), and in the left lobe, hepatic segment II (series 2, image 14). Status post cholecystectomy. No biliary ductal dilatation. Pancreas: Unremarkable. No pancreatic ductal dilatation or surrounding inflammatory changes. Spleen: Normal in size without significant abnormality. Adrenals/Urinary Tract: Adrenal glands are unremarkable. Right kidney is absent. Left kidney is severely atrophic. No hydronephrosis. Bladder is unremarkable. Stomach/Bowel: Stomach is within normal limits. Appendix appears normal. Circumferential wall thickening and mild fat stranding about the cecum, with significant fibrofatty mural stratification (series 2, image 38). Probable additional circumferential wall thickening and fat stranding about the rectum (series 6, image  65). Vascular/Lymphatic: No significant vascular findings are present. No enlarged abdominal or pelvic lymph nodes. Reproductive: No mass or other significant abnormality. Other: No abdominal wall hernia or abnormality. No ascites. 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 Musculoskeletal: No acute or significant osseous findings. IMPRESSION: 1. Circumferential wall thickening and mild fat stranding about the cecum, with significant fibrofatty mural stratification. Probable additional circumferential wall thickening and fat stranding about the rectum. Findings are consistent with nonspecific infectious or inflammatory colitis. 2. Multiple somewhat ill-defined hypodense lesions throughout the liver. These are incompletely characterized on this noncontrast examination however are similar to multiple prior examinations and possibly reflecting hemangiomata or other benign lesions such as focal nodular hyperplasias. As previously reported, recommend nonemergent multiphasic contrast enhanced CT or MRI to fully characterize. 3. Right kidney is absent. Left kidney is severely atrophic. 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 Electronically Signed   By: Delanna Ahmadi M.D.   On: 12/26/2021 16:11    ROS: All others negative except those listed in HPI.   Physical Exam: Vitals:   12/27/21 1930 12/27/21 2035 12/27/21 2350 12/28/21 0320  BP: 123/77 Marland Kitchen)  147/97 (!) 142/89 (!) 142/89  Pulse: 80 87 81 81  Resp: 13 18 15 15   Temp:  98.6 F (37 C)  98.6 F (37 C)  TempSrc:  Oral  Oral  SpO2: 95% 92% 95%   Weight:    78 kg  Height:    5\' 6"  (1.676 m)     General: WDWN NAD Head: NCAT sclera not icteric MMM Neck: Supple. No lymphadenopathy Lungs: CTA bilaterally. No wheeze, rales or rhonchi. Normal respiratory effort.  Heart: RRR. No murmur, rubs or gallops.   Lower extremities:no edema, ischemic changes, or open wounds  Neuro: AAOx3. Moves all extremities spontaneously. Psych:  Responds to questions appropriately with a normal affect. Dialysis Access: LUA AVF +b/t   Dialysis Orders:  SW TTS  4 hrs, BFR 300, DFR 500,  EDW 81kg, 2 K/ 3 Ca  Access: 16G LU AVG  Heparin 4000 bolus, 2500 intermittent  Mircera 75 mcg q2wks - last 10/3 Hectorol 2 mcg IV qHD   PO qHD   Assessment/Plan: GI bleeding  - Per primary team - GI consulted - Plan to hold heparin for HD tomorrow  Possible infectious colitis -per primary team  ESRD  - on HD TTS - No acute indications for HD today - Plan for HD tomorrow 10/17 per regular schedule. Hold heparin. - If d/c can return to OP unit with COVID precautions  COVID-19 infection -asymptomatic, not hypoxic  Liver Lesions -OP MRI f/u  Hypertension/Volume - on lopressor at home.  Does not appear volume overloaded on exam. CXR & CT chest w/atelectasis vs consolidation, possible L pleural effusion.  Under EDW last HD, possible weight loss, plan for net UF 1-2L as tolerated.  Anemia of CKD - Hgb drop 8.8 likely secondary to acute GIB.  Increase dose aranesp 196mcg with HD tomorrow.   Secondary Hyperparathyroidism  - Ca 7.7, Phos 4.9.  -Continue Renvela powder, Ca carbonate and hectorol  Nutrition - Renal diet w/fluid restrictions.   Uncontrolled DMT1 - insulin regimen per primary team   Progress note written with assistance by PA student.  PA performed own history and physical exam.  Plan discussed with student and documented.  Jen Mow, PA-C [facility] Pager: [phone]

## 2021-12-28 NOTE — Progress Notes (Signed)
PROGRESS NOTE    JASKIRAT ZERTUCHE  KKX:[id number] DOB: 03/23/1985 DOA: 12/26/2021 PCP: [name], MD   Brief Narrative:  HPI: [name] is a 36 y.o. female with medical history significant of ESRD, DM, pseudoseizures, gastroparesis, esophagitis, failed kidney transplant.   Pt in to ED at med center yesterday with hematemesis and melena.  Hemoccult positive.   COVID positive as well.   HGB looked okay yesterday.  Transfer for GIB requested but unfortunately due to lack of bed availability patient not transferred until today.   Question of DKA while in ED.  Had anion gap yesterday, given [place] insulin, anion gap resolved on BMP this morning at 0800.    Assessment & Plan:   Principal Problem:   GI bleeding Active Problems:   COVID-19 virus infection   ESRD on hemodialysis ([place])   Uncontrolled type 1 diabetes mellitus with hyperglycemia (HCC)   Essential hypertension, benign   Liver lesion  Upper GI bleeding/hematemesis and melena/acute blood loss anemia: Patient presented with hematemesis and melena prior to admission.  Hemoglobin around 12 upon presentation, down to 8.8 this morning.  Continue Protonix twice daily.  Transfuse if less than 7.  Check every 12 hours.  GI on board and plans for EGD and flex sigmoidoscopy tomorrow.  Possible infectious colitis: Although this could very well be as a consequence of COVID infection however I Will start on ciprofloxacin and Flagyl.   COVID-19 virus infection: She is asymptomatic and not hypoxic.  Chest imaging shows atelectasis and not groundglass opacities.  Continue molnupiravir, no indication of dexamethasone.  Patient was encouraged to prone, out of bed to chair, to use incentive spirometry and flutter valve.   Uncontrolled type 1 diabetes mellitus with hyperglycemia ([place]): Appears to be taking 20 units of Lantus at home.  Hemoglobin A1c 9.2.  Currently on Semglee 5 units and SSI and blood sugar very well controlled.   ESRD on  hemodialysis [facility]): Nephrology on board.   Liver lesion on Multiple liver lesions. Needs follow up MRI as outpt.   Essential hypertension, benign: Only on metoprolol which we will resume.  Controlled at the moment.  DVT prophylaxis: SCDs Start: 12/27/21 2112   Code Status: Full Code  Family Communication:  None present at bedside.  Plan of care discussed with patient in length and he/she verbalized understanding and agreed with it.  Status is: Observation The patient will require care spanning > 2 midnights and should be moved to inpatient because: Scheduled for EGD and colonoscopy in the morning.   Estimated body mass index is 27.75 kg/m as calculated from the following:   Height as of this encounter: 5\' 6"  (1.676 m).   Weight as of this encounter: 78 kg.    Nutritional Assessment: Body mass index is 27.75 kg/m.Marland Kitchen Seen by dietician.  I agree with the assessment and plan as outlined below: Nutrition Status:        . Skin Assessment: I have examined the patient's skin and I agree with the wound assessment as performed by the wound care RN as outlined below:    Consultants:  GI nephrology  Procedures:  None  Antimicrobials:  Anti-infectives (From admission, onward)    Start     Dose/Rate Route Frequency Ordered Stop   12/27/21 2215  molnupiravir EUA (LAGEVRIO) capsule 800 mg        4 capsule Oral 2 times daily 12/27/21 2117 01/01/22 2159         Subjective: Seen and examined.  She feels better.  No more hematemesis or melena.  No shortness of breath or cough or any other complaint.  Objective: Vitals:   12/27/21 1930 12/27/21 2035 12/27/21 2350 12/28/21 0320  BP: 123/77 (!) 147/97 (!) 142/89 (!) 142/89  Pulse: 80 87 81 81  Resp: 13 18 15 15   Temp:  98.6 F (37 C)  98.6 F (37 C)  TempSrc:  Oral  Oral  SpO2: 95% 92% 95%   Weight:    78 kg  Height:    5\' 6"  (1.676 m)    Intake/Output Summary (Last 24 hours) at 12/28/2021 1012 Last data filed at  12/27/2021 2350 Gross per 24 hour  Intake 120 ml  Output --  Net 120 ml   Filed Weights   12/28/21 0320  Weight: 78 kg    Examination:  General exam: Appears calm and comfortable  Respiratory system: Clear to auscultation. Respiratory effort normal. Cardiovascular system: S1 & S2 heard, RRR. No JVD, murmurs, rubs, gallops or clicks. No pedal edema. Gastrointestinal system: Abdomen is nondistended, soft and mild epigastric and right upper quadrant tenderness. No organomegaly or masses felt. Normal bowel sounds heard. Central nervous system: Alert and oriented. No focal neurological deficits. Extremities: Symmetric 5 x 5 power. Skin: No rashes, lesions or ulcers Psychiatry: Judgement and insight appear normal. Mood & affect appropriate.    Data Reviewed: I have personally reviewed following labs and imaging studies  CBC: Recent Labs  Lab 12/26/21 0012 12/26/21 1534 12/28/21 0314  WBC 11.4* 12.5* 11.4*  NEUTROABS 9.8* 10.1* 8.3*  HGB 12.4 12.5 8.8*  HCT 36.9 39.3 27.7*  MCV 99.5 102.3* 101.8*  PLT 196 244 397   Basic Metabolic Panel: Recent Labs  Lab 12/26/21 0012 12/26/21 1445 12/27/21 0813 12/28/21 0314  NA 137 138 139 135  K 4.5 3.9 4.5 3.9  CL 89* 91* 99 97*  CO2 28 27 29 25   GLUCOSE 603* 398* 208* 133*  BUN 43* 24* 39* 53*  CREATININE 8.98* 4.95* 6.53* 8.42*  CALCIUM 8.8* 10.2 8.8* 7.7*  MG  --   --   --  1.9  PHOS  --   --   --  4.9*   GFR: Estimated Creatinine Clearance: 9.6 mL/min (A) (by C-G formula based on SCr of 8.42 mg/dL (H)). Liver Function Tests: Recent Labs  Lab 12/26/21 0012 12/26/21 1445 12/28/21 0314  AST 22 17 33  ALT 16 10 26   ALKPHOS 65 55 50  BILITOT 0.9 0.7 0.5  PROT 9.2* 9.7* 7.6  ALBUMIN 4.3 4.7 3.6   Recent Labs  Lab 12/26/21 0012 12/26/21 1445  LIPASE 69* 47   No results for input(s): "AMMONIA" in the last 168 hours. Coagulation Profile: Recent Labs  Lab 12/26/21 1534  INR 1.2   Cardiac Enzymes: No results  for input(s): "CKTOTAL", "CKMB", "CKMBINDEX", "TROPONINI" in the last 168 hours. BNP (last 3 results) No results for input(s): "PROBNP" in the last 8760 hours. HbA1C: Recent Labs    12/28/21 0313  HGBA1C 9.2*   CBG: Recent Labs  Lab 12/27/21 2051 12/27/21 2357 12/28/21 0516 12/28/21 0607 12/28/21 0832  GLUCAP 332* 312* 57* 101* 129*   Lipid Profile: No results for input(s): "CHOL", "HDL", "LDLCALC", "TRIG", "CHOLHDL", "LDLDIRECT" in the last 72 hours. Thyroid Function Tests: No results for input(s): "TSH", "T4TOTAL", "FREET4", "T3FREE", "THYROIDAB" in the last 72 hours. Anemia Panel: No results for input(s): "VITAMINB12", "FOLATE", "FERRITIN", "TIBC", "IRON", "RETICCTPCT" in the last 72 hours. Sepsis Labs: Recent Labs  Lab 12/28/21 0313  PROCALCITON 3.24    Recent Results (from the past 240 hour(s))  Resp Panel by RT-PCR (Flu A&B, Covid) Anterior Nasal Swab     Status: Abnormal   Collection Time: 12/27/21 11:18 AM   Specimen: Anterior Nasal Swab  Result Value Ref Range Status   SARS Coronavirus 2 by RT PCR POSITIVE (A) NEGATIVE Final    Comment: (NOTE) SARS-CoV-2 target nucleic acids are DETECTED.  The SARS-CoV-2 RNA is generally detectable in upper respiratory specimens during the acute phase of infection. Positive results are indicative of the presence of the identified virus, but do not rule out bacterial infection or co-infection with other pathogens not detected by the test. Clinical correlation with patient history and other diagnostic information is necessary to determine patient infection status. The expected result is Negative.  Fact Sheet for Patients: EntrepreneurPulse.com.au  Fact Sheet for Healthcare Providers: IncredibleEmployment.be  This test is not yet approved or cleared by the [place] FDA and  has been authorized for detection and/or diagnosis of SARS-CoV-2 by FDA under an Emergency Use Authorization  (EUA).  This EUA will remain in effect (meaning this test can be used) for the duration of  the COVID-19 declaration under Section 564(b)(1) of the A ct, 21 U.S.C. section 360bbb-3(b)(1), unless the authorization is terminated or revoked sooner.     Influenza A by PCR NEGATIVE NEGATIVE Final   Influenza B by PCR NEGATIVE NEGATIVE Final    Comment: (NOTE) The Xpert Xpress SARS-CoV-2/FLU/RSV plus assay is intended as an aid in the diagnosis of influenza from Nasopharyngeal swab specimens and should not be used as a sole basis for treatment. Nasal washings and aspirates are unacceptable for Xpert Xpress SARS-CoV-2/FLU/RSV testing.  Fact Sheet for Patients: EntrepreneurPulse.com.au  Fact Sheet for Healthcare Providers: IncredibleEmployment.be  This test is not yet approved or cleared by the [place] FDA and has been authorized for detection and/or diagnosis of SARS-CoV-2 by FDA under an Emergency Use Authorization (EUA). This EUA will remain in effect (meaning this test can be used) for the duration of the COVID-19 declaration under Section 564(b)(1) of the Act, 21 U.S.C. section 360bbb-3(b)(1), unless the authorization is terminated or revoked.  Performed at [facility], [address], [place], [place] [address]      Radiology Studies: CT Angio Chest/Abd/Pel for Dissection W and/or Wo Contrast  Result Date: 12/27/2021 CLINICAL DATA:  Acute aortic syndrome suspected EXAM: CT ANGIOGRAPHY CHEST, ABDOMEN AND PELVIS TECHNIQUE: Non-contrast CT of the chest was initially obtained. Multidetector CT imaging through the chest, abdomen and pelvis was performed using the standard protocol during bolus administration of intravenous contrast. Multiplanar reconstructed images and MIPs were obtained and reviewed to evaluate the vascular anatomy. RADIATION DOSE REDUCTION: This exam was performed according to the departmental  dose-optimization program which includes automated exposure control, adjustment of the mA and/or kV according to patient size and/or use of iterative reconstruction technique. CONTRAST:  146mL OMNIPAQUE IOHEXOL 350 MG/ML SOLN COMPARISON:  Same day chest radiograph, CT abdomen/pelvis 1 day prior FINDINGS: CTA CHEST FINDINGS Cardiovascular: There is no evidence of acute intramural hematoma on the initial noncontrast images. There is no evidence of aortic aneurysm or dissection of the postcontrast images. There is no evidence of pulmonary embolism. The heart size is normal. Coronary artery calcifications are seen, accelerated for age. Mediastinum/Nodes: The thyroid is unremarkable. The esophagus is grossly unremarkable. There is a prominent precarinal lymph node measuring up to 1.3 cm in the axial plane, also present  in the PET-CT from 2010 and only minimally increased in size, nonspecific. There is no mediastinal, hilar, or axillary lymphadenopathy. Lungs/Pleura: The trachea and central airways are patent. There are new patchy and consolidative opacities in both lower lobes not present on the CT abdomen/pelvis from 1 day prior. The upper lobes and right middle lobe are well-aerated. There is no pulmonary edema. There is no pleural effusion or pneumothorax. There are no suspicious nodules. Musculoskeletal: There is no acute osseous abnormality or suspicious osseous lesion. Review of the MIP images confirms the above findings. CTA ABDOMEN AND PELVIS FINDINGS VASCULAR Aorta: Normal caliber aorta without aneurysm, dissection, vasculitis or significant stenosis. Celiac: Patent without evidence of aneurysm, dissection, vasculitis or significant stenosis. SMA: Patent without evidence of aneurysm, dissection, vasculitis or significant stenosis. Renals: A right renal artery is not identified. A markedly diminutive left renal artery is seen. There is corresponding marked left renal atrophy. IMA: Patent without evidence of  aneurysm, dissection, vasculitis or significant stenosis. Inflow: Patent without evidence of aneurysm, dissection, vasculitis or significant stenosis. Veins: No obvious venous abnormality within the limitations of this arterial phase study. Review of the MIP images confirms the above findings. NON-VASCULAR Hepatobiliary: Ill-defined hypodense lesions are again seen in the liver (for example 5-89, 5-96), incompletely characterized. There is no acute abnormality in the liver. The gallbladder is surgically absent. There is no biliary ductal dilatation. Pancreas: Unremarkable. Spleen: Unremarkable. Adrenals/Urinary Tract: The adrenals are unremarkable. The right kidney is absent. The markedly atrophic left kidney is unchanged. There are no focal lesions, stones, or hydroureteronephrosis. The bladder is decompressed. Stomach/Bowel: The stomach is unremarkable. There is no evidence of bowel obstruction. Mild wall thickening with mild fat stranding around the cecum is similar to the study from 1 day prior. Suspected wall thickening and mild surrounding inflammatory changes are also again seen around the rectum, also not significantly changed. There is no new or progressive abnormal bowel wall thickening or inflammatory change. Appendix is normal. Lymphatic: There is no abdominopelvic lymphadenopathy. Reproductive: Uterus is surgically absent. There is no adnexal mass. Other: Fibrotic scarring in the right lower quadrant is unchanged. There is no ascites or free air. Musculoskeletal: There is no acute osseous abnormality or suspicious osseous lesion. Review of the MIP images confirms the above findings. IMPRESSION: 1. No evidence of aortic aneurysm or dissection. 2. Patchy and consolidative opacities in both lung bases favored to reflect atelectasis given normal appearance of the lung bases on the CT abdomen/pelvis from 1 day prior; however, developing multifocal infection is not entirely excluded. 3. Unchanged mild wall  thickening and inflammatory change about the cecum and rectum suspicious for nonspecific infectious or inflammatory colitis. 4. Coronary artery calcifications, accelerated for age. 5. Ill-defined hypodense liver lesions again seen for which nonemergent outpatient multiphase abdominal MRI with and without contrast is recommended for characterization. Electronically Signed   By: Valetta Mole M.D.   On: 12/27/2021 13:37   DG Chest Portable 1 View  Result Date: 12/27/2021 CLINICAL DATA:  36 year old female with history of severe chest pain. History of sickle cell disease. Shortness of breath. EXAM: PORTABLE CHEST 1 VIEW COMPARISON:  Chest x-ray 09/28/2021. FINDINGS: Lung volumes are low. Ill-defined opacity at the left base which may reflect atelectasis and/or consolidation. Mild blunting of the left costophrenic sulcus concerning for a small left pleural effusion. Right lung is clear. No right pleural effusion. No pneumothorax. No evidence of pulmonary edema. Heart size is normal. Upper mediastinal contours are within normal limits. IMPRESSION: 1. Low  lung volumes with atelectasis and/or consolidation in the left lung base and probable small left pleural effusion. Electronically Signed   By: Vinnie Langton M.D.   On: 12/27/2021 08:58   CT ABDOMEN PELVIS WO CONTRAST  Result Date: 12/26/2021 CLINICAL DATA:  Abdominal pain, vomiting, hemodialysis today EXAM: CT ABDOMEN AND PELVIS WITHOUT CONTRAST TECHNIQUE: Multidetector CT imaging of the abdomen and pelvis was performed following the standard protocol without IV contrast. RADIATION DOSE REDUCTION: This exam was performed according to the departmental dose-optimization program which includes automated exposure control, adjustment of the mA and/or kV according to patient size and/or use of iterative reconstruction technique. COMPARISON:  06/10/2021 05/30/2021, 02/10/2021, numerous additional prior examination FINDINGS: Lower chest: No acute abnormality.  Hepatobiliary: Multiple somewhat ill-defined hypodense lesions throughout the liver, for example in the anterior liver dome, hepatic segment VIII (series 2, image 8), in the central right lobe of the liver, segment VII/VIII, (series 2, image 12), in the inferior right lobe of the liver segment VI (series 2, image 28), and in the left lobe, hepatic segment II (series 2, image 14). Status post cholecystectomy. No biliary ductal dilatation. Pancreas: Unremarkable. No pancreatic ductal dilatation or surrounding inflammatory changes. Spleen: Normal in size without significant abnormality. Adrenals/Urinary Tract: Adrenal glands are unremarkable. Right kidney is absent. Left kidney is severely atrophic. No hydronephrosis. Bladder is unremarkable. Stomach/Bowel: Stomach is within normal limits. Appendix appears normal. Circumferential wall thickening and mild fat stranding about the cecum, with significant fibrofatty mural stratification (series 2, image 38). Probable additional circumferential wall thickening and fat stranding about the rectum (series 6, image 65). Vascular/Lymphatic: No significant vascular findings are present. No enlarged abdominal or pelvic lymph nodes. Reproductive: No mass or other significant abnormality. Other: No abdominal wall hernia or abnormality. No ascites. 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 Musculoskeletal: No acute or significant osseous findings. IMPRESSION: 1. Circumferential wall thickening and mild fat stranding about the cecum, with significant fibrofatty mural stratification. Probable additional circumferential wall thickening and fat stranding about the rectum. Findings are consistent with nonspecific infectious or inflammatory colitis. 2. Multiple somewhat ill-defined hypodense lesions throughout the liver. These are incompletely characterized on this noncontrast examination however are  similar to multiple prior examinations and possibly reflecting hemangiomata or other benign lesions such as focal nodular hyperplasias. As previously reported, recommend nonemergent multiphasic contrast enhanced CT or MRI to fully characterize. 3. Right kidney is absent. Left kidney is severely atrophic. 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 Electronically Signed   By: Delanna Ahmadi M.D.   On: 12/26/2021 16:11    Scheduled Meds:  calcium acetate  1,334 mg Oral TID WC   calcium acetate  667 mg Oral With snacks   Chlorhexidine Gluconate Cloth  6 each Topical Q0600   insulin aspart  0-9 Units Subcutaneous Q4H   insulin glargine-yfgn  5 Units Subcutaneous QHS   molnupiravir EUA  4 capsule Oral BID   pantoprazole  40 mg Intravenous Q12H   sevelamer carbonate  2.4 g Oral TID with meals   simvastatin  20 mg Oral QHS   Continuous Infusions:   LOS: 0 days   Darliss Cheney, MD [facility]  12/28/2021, 10:12 AM   *Please note that this is a verbal dictation therefore any spelling or grammatical errors are due to the "[place]" system interpretation.  Please page via [place] and  do not message via secure chat for urgent patient care matters. Secure chat can be used for non urgent patient care matters.  How to contact the [facility] Attending or Consulting provider [place] or covering provider during after hours [place], for this patient?  Check the care team in [facility] and look for a) attending/consulting [facility] provider listed and b) the [facility] team listed. Page or secure chat [phone]. Log into [email or web address] and use [place]'s universal password to access. If you do not have the password, please contact the hospital operator. Locate the [facility] provider you are looking for under [facility] and page to a number that you can be directly reached. If you still have difficulty reaching the provider, please page the [facility] (Director  on Call) for the Hospitalists listed on amion for assistance.

## 2021-12-28 NOTE — Inpatient Diabetes Management (Signed)
Inpatient Diabetes Program Recommendations  AACE/ADA: New Consensus Statement on Inpatient Glycemic Control (2015)  Target Ranges:  Prepandial:   less than 140 mg/dL      Peak postprandial:   less than 180 mg/dL (1-2 hours)      Critically ill patients:  140 - 180 mg/dL   Lab Results  Component Value Date   GLUCAP 129 (H) 12/28/2021   HGBA1C 9.2 (H) 12/28/2021    Review of Glycemic Control  Latest Reference Range & Units 12/27/21 23:57 12/28/21 05:16 12/28/21 06:07 12/28/21 08:32  Glucose-Capillary 70 - 99 mg/dL 312 (H) 57 (L) 101 (H) 129 (H)  (H): Data is abnormally high (L): Data is abnormally low Diabetes history: Type 1 DM Outpatient Diabetes medications: Tresiba 5-10 units QD, Novolog 0-15 units TID Current orders for Inpatient glycemic control: Semglee 5 units QHS, Novolog 0-9 units Q4H Solumedrol 40 mg x 1  Inpatient Diabetes Program Recommendations:    Noted hypoglycemia of 57 mg/dL. Of note, short acting given >1 hour following previous CBG increasing hypoglycemia risk.  Consider decreasing correction to Novolog 0-6 units Q4H.   Thanks, Bronson Curb, MSN, RNC-OB Diabetes Coordinator [phone] (8a-5p)

## 2021-12-28 NOTE — Progress Notes (Signed)
Initial Nutrition Assessment  DOCUMENTATION CODES:   Not applicable  INTERVENTION:  Monitor for diet advancement Once diet advances, recommend addition of Ensure Enlive po BID, each supplement provides 350 kcal and 20 grams of protein to optimize nutritional intake  NUTRITION DIAGNOSIS:   Inadequate oral intake related to dysphagia, decreased appetite as evidenced by per patient/family report.  GOAL:   Patient will meet greater than or equal to 90% of their needs  MONITOR:   PO intake, Supplement acceptance, Diet advancement, Labs, Weight trends  REASON FOR ASSESSMENT:   Malnutrition Screening Tool    ASSESSMENT:   Pt admitted with abdominal pain and GIB. Incidentally found to be COVID+. PMH significant for ESRD, DM, pseudoseizures, gastroparesis, esophagitis, failed kidney transplant. Food allergies: peanut- containing drug products, shellfish, banana  Pt laying in bed, reports abdominal pain since walking around room. Made RN aware.   Per review of GI  note, pt with dysphagia, nausea and vomiting and has been followed outpatient by GI. 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 Plans for EGD and flex sig today.     She states that she has had a poor appetite within the last year d/t digestive and dysphagia problems. She recalls eating very minimally including just the inside of an egg biscuit, occasionally eating some of the biscuit, 1 taco for lunch and chicken with mashed potatoes and green beans for dinner.  She monitors her blood sugar daily d/t T1DM. It generally runs >200, 300.   She endorses recent weight loss but did not quantify how much. Reviewed documented weight history. Overall within the last year her weight appears to have remained fairly stable between 78-79 kg.   Edema: mild pitting BLE  Medications: phoslo TID +snacks, SSI 0-9 units q4h, semglee 5 units qhs, protonix, renvela, IV abx  Labs: sodium 133, BUN 60, Cr 10.00, corrected Ca 7.62,  GFR 5, HgbA1c 9.2%, CBG's 102-207 x24 hours  NUTRITION - FOCUSED PHYSICAL EXAM:  Flowsheet Row Most Recent Value  Orbital Region No depletion  Upper Arm Region No depletion  Thoracic and Lumbar Region No depletion  Buccal Region No depletion  Temple Region No depletion  Clavicle Bone Region No depletion  Clavicle and Acromion Bone Region No depletion  Scapular Bone Region No depletion  Dorsal Hand No depletion  Patellar Region No depletion  Anterior Thigh Region No depletion  Posterior Calf Region No depletion  Edema (RD Assessment) None  Hair Reviewed  Eyes Reviewed  Mouth Reviewed  Skin Reviewed  Nails Reviewed      Diet Order:   Diet Order             Diet NPO time specified Except for: Sips with Meds  Diet effective midnight                   EDUCATION NEEDS:   Education needs have been addressed  Skin:  Skin Assessment: Reviewed RN Assessment  Last BM:  10/16  Height:   Ht Readings from Last 1 Encounters:  12/28/21 5\' 6"  (1.676 m)    Weight:   Wt Readings from Last 1 Encounters:  12/28/21 78 kg   BMI:  Body mass index is 27.75 kg/m.  Estimated Nutritional Needs:   Kcal:  1950-2150  Protein:  95-110  Fluid:  1L + uop  Clayborne Dana, RDN, LDN Clinical Nutrition

## 2021-12-28 NOTE — Progress Notes (Addendum)
PHARMACY NOTE:  ANTIMICROBIAL RENAL DOSAGE ADJUSTMENT  Current antimicrobial regimen includes a mismatch between antimicrobial dosage and estimated renal function.  As per policy approved by the Pharmacy & Therapeutics and Medical Executive Committees, the antimicrobial dosage will be adjusted accordingly.  Current antimicrobial dosage:  ciprofloxacin 400mg  IV q12 hours  Indication: infectious colitis  Renal Function:  Estimated Creatinine Clearance: 9.6 mL/min (A) (by C-G formula based on SCr of 8.42 mg/dL (H)). [x]      On intermittent HD, scheduled: TTS []      On CRRT    Antimicrobial dosage has been changed to:  ciprofloxacin 400mg  IV q24 hours after HD  Additional comments:   Thank you for allowing pharmacy to be a part of this patient's care.  Dimple Nanas, PharmD, BCPS 12/28/2021 10:36 AM

## 2021-12-28 NOTE — H&P (View-Only) (Signed)
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 abnormality or suspicious osseous lesion. Review of the MIP images confirms the above findings. IMPRESSION: 1. No evidence of aortic aneurysm or dissection. 2. Patchy and consolidative opacities in both lung bases favored to reflect atelectasis given normal appearance of the lung bases on the CT abdomen/pelvis from 1 day prior; however, developing multifocal infection is not entirely excluded. 3.  Unchanged mild wall thickening and inflammatory change about the cecum and rectum suspicious for nonspecific infectious or inflammatory colitis. 4. Coronary artery calcifications, accelerated for age. 5. Ill-defined hypodense liver lesions again seen for which nonemergent outpatient multiphase abdominal MRI with and without contrast is recommended for characterization. Electronically Signed   By: Valetta Mole M.D.   On: 12/27/2021 13:37   DG Chest Portable 1 View  Result Date: 12/27/2021 CLINICAL DATA:  36 year old female with history of severe chest pain. History of sickle cell disease. Shortness of breath. EXAM: PORTABLE CHEST 1 VIEW COMPARISON:  Chest x-ray 09/28/2021. FINDINGS: Lung volumes are low. Ill-defined opacity at the left base which may reflect atelectasis and/or consolidation. Mild blunting of the left costophrenic sulcus concerning for a small left pleural effusion. Right lung is clear. No right pleural effusion. No pneumothorax. No evidence of pulmonary edema. Heart size is normal. Upper mediastinal contours are within normal limits. IMPRESSION: 1. Low lung volumes with atelectasis and/or consolidation in the left lung base and probable small left pleural effusion. Electronically Signed   By: Vinnie Langton M.D.   On: 12/27/2021 08:58   CT ABDOMEN PELVIS WO CONTRAST  Result Date: 12/26/2021 CLINICAL DATA:  Abdominal pain, vomiting, hemodialysis today EXAM: CT ABDOMEN AND PELVIS WITHOUT CONTRAST TECHNIQUE: Multidetector CT imaging of the abdomen and pelvis was performed following the standard protocol without IV contrast. RADIATION DOSE REDUCTION: This exam was performed according to the departmental dose-optimization program which includes automated exposure control, adjustment of the mA and/or kV according to patient size and/or use of iterative reconstruction technique. COMPARISON:  06/10/2021 05/30/2021, 02/10/2021, numerous additional prior examination FINDINGS: Lower chest: No acute  abnormality. Hepatobiliary: Multiple somewhat ill-defined hypodense lesions throughout the liver, for example in the anterior liver dome, hepatic segment VIII (series 2, image 8), in the central right lobe of the liver, segment VII/VIII, (series 2, image 12), in the inferior right lobe of the liver segment VI (series 2, image 28), and in the left lobe, hepatic segment II (series 2, image 14). Status post cholecystectomy. No biliary ductal dilatation. Pancreas: Unremarkable. No pancreatic ductal dilatation or surrounding inflammatory changes. Spleen: Normal in size without significant abnormality. Adrenals/Urinary Tract: Adrenal glands are unremarkable. Right kidney is absent. Left kidney is severely atrophic. No hydronephrosis. Bladder is unremarkable. Stomach/Bowel: Stomach  is within normal limits. Appendix appears normal. Circumferential wall thickening and mild fat stranding about the cecum, with significant fibrofatty mural stratification (series 2, image 38). Probable additional circumferential wall thickening and fat stranding about the rectum (series 6, image 65). Vascular/Lymphatic: No significant vascular findings are present. No enlarged abdominal or pelvic lymph nodes. Reproductive: No mass or other significant abnormality. Other: No abdominal wall hernia or abnormality. No ascites. 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 Musculoskeletal: No acute or significant osseous findings. IMPRESSION: 1. Circumferential wall thickening and mild fat stranding about the cecum, with significant fibrofatty mural stratification. Probable additional circumferential wall thickening and fat stranding about the rectum. Findings are consistent with nonspecific infectious or inflammatory colitis. 2. Multiple somewhat ill-defined hypodense lesions throughout the liver. These are incompletely characterized on this noncontrast examination  however are similar to multiple prior examinations and possibly reflecting hemangiomata or other benign lesions such as focal nodular hyperplasias. As previously reported, recommend nonemergent multiphasic contrast enhanced CT or MRI to fully characterize. 3. Right kidney is absent. Left kidney is severely atrophic. 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 Electronically Signed   By: Delanna Ahmadi M.D.   On: 12/26/2021 16:11      IMPRESSION:     Acute flare chronic, refractory n/v, abd pain w/ hematemesis, dark stools and FOBT +.  CT w colitis at cecum, rectum.  Multiple EGDs and dilations of refractory esoph strictures but no prior colnoscopy.  Dx of diabetic gastroparesis not confirmed at [facility] h GES in 04/2021.      ESRD.  Failed renal transplant.      IDDM   Elevated I stat HCG. Sexually active and does not practice birth control.  Though charts, radiology reports mention hysterectomy, pt denies this.  Uterus absent on CT.     Covid 19 positive.  CXR: atx and/or consolidation LL base, w likley small effusion.  CT chest: bb atx, possible developinig multifocal pna.      PLAN:      Check serum HCG  Plan EGD and flexible sigmoidoscopy tomorrow, though plans may change if confirmatory HCG is positive.  Will give a tapwater enema prior to procedures.  NPO after midnight.  Continue clear liquids.  Switch from PPI drip to IV twice daily.   Azucena Freed  12/28/2021, 8:22 AM Phone [phone]    Attending Physician Note   I have taken a history, reviewed the chart and examined the patient. I performed a substantive portion of this encounter, including complete performance of at least one of the key components, in conjunction with the APP. I agree with the APP's note, impression and recommendations with my edits. My additional impressions and recommendations are as follows.   Acute on chronic N/V with hematemesis,  dark FOBT + stools, mild dysphagia and thickened cecum, rectum on CT. History of refractory esophagitis and esophageal strictures with recent evaluation at [facility] with plans for GES and esophageal manometry. Covid-19 positive with possible gastroenteritis and possible early PNA.    *PPI IV bid *EGD, Flex sigmoidoscopy tomorrow. She cannot tolerate a bowel prep for colonoscopy at this time *Continue outpatient evaluation with [facility] as planned   Lucio Edward, MD [facility] See [facility], [facility], for our on call provider

## 2021-12-28 NOTE — Progress Notes (Signed)
[facility] BRIEF PROGRESS NOTE  [name] [id number] December 08, 1985  Patient is a 36 year old female with ESRD on HD TTS at [facility].  Presented to the ED at [place] due to hematemesis and melena with positive hemoccult, and incidental finding of COVID +.  Ct showed possible PNA and colitis.  Hemoglobin drop from 12.2 to 8.8 this AM.  Patient transferred to [facility] for GI consult.  Last completed full dialysis on 12/26/21, leaving 2.2kg under dry weight.  Labs this AM SCr 8.42, BUN 53, CO2 25, K 3.9, Ca 7.7, phos 4.9, alb 3.6, and Hgb 8.8.  Outpatient HD orders:  SW TTS 4hrs, 300/500 81kg 2K 3Ca  16G LU AVG Heparin 4000 bolus, 2500 intermittent  Mircera 57mcg q2wks - last 10/3 Hectorol 61mcg qHD  Assessment/Plan: No acute indications for HD today.  Plan for HD tomorrow per regular schedule.  If discharge can return to outpatient unit with COVID precautions.  Increase mircera dose with HD tomorrow.    Jen Mow, PA-C [employer]

## 2021-12-29 ENCOUNTER — Inpatient Hospital Stay (HOSPITAL_COMMUNITY): Payer: Medicare Other | Admitting: Certified Registered Nurse Anesthetist

## 2021-12-29 ENCOUNTER — Encounter (HOSPITAL_COMMUNITY): Payer: Self-pay | Admitting: Family Medicine

## 2021-12-29 ENCOUNTER — Encounter (HOSPITAL_COMMUNITY)
Admission: EM | Disposition: A | Discharge status: Home / Self Care | Payer: Self-pay | Source: Home / Self Care | Attending: Family Medicine

## 2021-12-29 DIAGNOSIS — K921 Melena: Secondary | ICD-10-CM | POA: Diagnosis not present

## 2021-12-29 DIAGNOSIS — Z794 Long term (current) use of insulin: Secondary | ICD-10-CM

## 2021-12-29 DIAGNOSIS — R1010 Upper abdominal pain, unspecified: Secondary | ICD-10-CM

## 2021-12-29 DIAGNOSIS — K449 Diaphragmatic hernia without obstruction or gangrene: Secondary | ICD-10-CM

## 2021-12-29 DIAGNOSIS — R933 Abnormal findings on diagnostic imaging of other parts of digestive tract: Secondary | ICD-10-CM

## 2021-12-29 DIAGNOSIS — K2100 Gastro-esophageal reflux disease with esophagitis, without bleeding: Secondary | ICD-10-CM

## 2021-12-29 DIAGNOSIS — K222 Esophageal obstruction: Secondary | ICD-10-CM

## 2021-12-29 DIAGNOSIS — R1013 Epigastric pain: Secondary | ICD-10-CM

## 2021-12-29 DIAGNOSIS — R1310 Dysphagia, unspecified: Secondary | ICD-10-CM

## 2021-12-29 DIAGNOSIS — E119 Type 2 diabetes mellitus without complications: Secondary | ICD-10-CM

## 2021-12-29 DIAGNOSIS — I10 Essential (primary) hypertension: Secondary | ICD-10-CM

## 2021-12-29 HISTORY — PX: FLEXIBLE SIGMOIDOSCOPY: SHX5431

## 2021-12-29 HISTORY — PX: ESOPHAGOGASTRODUODENOSCOPY (EGD) WITH PROPOFOL: SHX5813

## 2021-12-29 HISTORY — PX: BIOPSY: SHX5522

## 2021-12-29 LAB — CBC WITH DIFFERENTIAL/PLATELET
Abs Immature Granulocytes: 0.04 10*3/uL (ref 0.00–0.07)
Basophils Absolute: 0 10*3/uL (ref 0.0–0.1)
Basophils Relative: 1 %
Eosinophils Absolute: 0.2 10*3/uL (ref 0.0–0.5)
Eosinophils Relative: 3 %
HCT: 25.2 % — ABNORMAL LOW (ref 36.0–46.0)
Hemoglobin: 8.5 g/dL — ABNORMAL LOW (ref 12.0–15.0)
Immature Granulocytes: 1 %
Lymphocytes Relative: 24 %
Lymphs Abs: 1.8 10*3/uL (ref 0.7–4.0)
MCH: 34.6 pg — ABNORMAL HIGH (ref 26.0–34.0)
MCHC: 33.7 g/dL (ref 30.0–36.0)
MCV: 102.4 fL — ABNORMAL HIGH (ref 80.0–100.0)
Monocytes Absolute: 0.4 10*3/uL (ref 0.1–1.0)
Monocytes Relative: 5 %
Neutro Abs: 5 10*3/uL (ref 1.7–7.7)
Neutrophils Relative %: 66 %
Platelets: 149 10*3/uL — ABNORMAL LOW (ref 150–400)
RBC: 2.46 MIL/uL — ABNORMAL LOW (ref 3.87–5.11)
RDW: 15 % (ref 11.5–15.5)
WBC: 7.5 10*3/uL (ref 4.0–10.5)
nRBC: 0 % (ref 0.0–0.2)

## 2021-12-29 LAB — COMPREHENSIVE METABOLIC PANEL
ALT: 21 U/L (ref 0–44)
AST: 24 U/L (ref 15–41)
Albumin: 3.1 g/dL — ABNORMAL LOW (ref 3.5–5.0)
Alkaline Phosphatase: 42 U/L (ref 38–126)
Anion gap: 14 (ref 5–15)
BUN: 60 mg/dL — ABNORMAL HIGH (ref 6–20)
CO2: 20 mmol/L — ABNORMAL LOW (ref 22–32)
Calcium: 6.9 mg/dL — ABNORMAL LOW (ref 8.9–10.3)
Chloride: 99 mmol/L (ref 98–111)
Creatinine, Ser: 10 mg/dL — ABNORMAL HIGH (ref 0.44–1.00)
GFR, Estimated: 5 mL/min — ABNORMAL LOW (ref >60)
Glucose, Bld: 129 mg/dL — ABNORMAL HIGH (ref 70–99)
Potassium: 4.2 mmol/L (ref 3.5–5.1)
Sodium: 133 mmol/L — ABNORMAL LOW (ref 135–145)
Total Bilirubin: 0.6 mg/dL (ref 0.3–1.2)
Total Protein: 6.8 g/dL (ref 6.5–8.1)

## 2021-12-29 LAB — C-REACTIVE PROTEIN: CRP: 1.2 mg/dL — ABNORMAL HIGH (ref <1.0)

## 2021-12-29 LAB — GLUCOSE, CAPILLARY
Glucose-Capillary: 143 mg/dL — ABNORMAL HIGH (ref 70–99)
Glucose-Capillary: 130 mg/dL — ABNORMAL HIGH (ref 70–99)
Glucose-Capillary: 87 mg/dL (ref 70–99)
Glucose-Capillary: 93 mg/dL (ref 70–99)
Glucose-Capillary: 119 mg/dL — ABNORMAL HIGH (ref 70–99)
Glucose-Capillary: 130 mg/dL — ABNORMAL HIGH (ref 70–99)

## 2021-12-29 LAB — HEPATITIS B SURFACE ANTIBODY, QUANTITATIVE: Hep B S AB Quant (Post): 128.6 m[IU]/mL (ref >9.9)

## 2021-12-29 SURGERY — ESOPHAGOGASTRODUODENOSCOPY (EGD) WITH PROPOFOL
Anesthesia: Monitor Anesthesia Care

## 2021-12-29 MED ORDER — CALCIUM CARBONATE ANTACID 1250 MG/5ML PO SUSP
500.0000 mg | Freq: Every day | ORAL | Status: DC
  Administered 2021-12-30: 500 mg via ORAL
  Filled 2021-12-29 (×3): qty 5

## 2021-12-29 MED ORDER — SODIUM CHLORIDE 0.9 % IV SOLN: INTRAVENOUS | Status: DC

## 2021-12-29 MED ORDER — PROPOFOL 500 MG/50ML IV EMUL
INTRAVENOUS | Status: DC | PRN
  Administered 2021-12-29: 100 ug/kg/min via INTRAVENOUS

## 2021-12-29 MED ORDER — METOPROLOL TARTRATE 25 MG PO TABS
25.0000 mg | ORAL_TABLET | Freq: Two times a day (BID) | ORAL | Status: DC
  Administered 2021-12-29 – 2022-01-01 (×7): 25 mg via ORAL
  Filled 2021-12-29 (×8): qty 1

## 2021-12-29 SURGICAL SUPPLY — 15 items

## 2021-12-29 NOTE — Progress Notes (Signed)
Received patient in bed to unit.  Alert and oriented.  Informed consent signed and in chart.   Treatment initiated: 1700 Treatment completed: 2030  Patient tolerated well.  without acute distress. Pt treatment delate  1 hour because  she  was doing  upper endoscopy. Hand-off given to patient's nurse.   Access used: AVG Access issues: VP high.  Total UF removed: 3 L Medication(s) given: none Post HD VS: 130/77 P 88 R 20 Post HD weight: 75.6 kg   [facility]

## 2021-12-29 NOTE — Plan of Care (Signed)
  Problem: Education: Goal: Knowledge of General Education information will improve Description: Including pain rating scale, medication(s)/side effects and non-pharmacologic comfort measures Outcome: Progressing   Problem: Health Behavior/Discharge Planning: Goal: Ability to manage health-related needs will improve Outcome: Progressing   Problem: Clinical Measurements: Goal: Ability to maintain clinical measurements within normal limits will improve Outcome: Progressing Goal: Will remain free from infection Outcome: Progressing Goal: Diagnostic test results will improve Outcome: Progressing Goal: Respiratory complications will improve Outcome: Progressing Goal: Cardiovascular complication will be avoided Outcome: Progressing   Problem: Activity: Goal: Risk for activity intolerance will decrease Outcome: Progressing   Problem: Nutrition: Goal: Adequate nutrition will be maintained Outcome: Progressing   Problem: Coping: Goal: Level of anxiety will decrease Outcome: Progressing   Problem: Elimination: Goal: Will not experience complications related to bowel motility Outcome: Progressing Goal: Will not experience complications related to urinary retention Outcome: Progressing   Problem: Pain Managment: Goal: General experience of comfort will improve Outcome: Progressing   Problem: Safety: Goal: Ability to remain free from injury will improve Outcome: Progressing   Problem: Skin Integrity: Goal: Risk for impaired skin integrity will decrease Outcome: Progressing   Problem: Education: Goal: Ability to describe self-care measures that may prevent or decrease complications (Diabetes Survival Skills Education) will improve Outcome: Progressing Goal: Individualized Educational Video(s) Outcome: Progressing   Problem: Coping: Goal: Ability to adjust to condition or change in health will improve Outcome: Progressing   Problem: Fluid Volume: Goal: Ability to  maintain a balanced intake and output will improve Outcome: Progressing   Problem: Health Behavior/Discharge Planning: Goal: Ability to identify and utilize available resources and services will improve Outcome: Progressing Goal: Ability to manage health-related needs will improve Outcome: Progressing   Problem: Metabolic: Goal: Ability to maintain appropriate glucose levels will improve Outcome: Progressing   Problem: Nutritional: Goal: Maintenance of adequate nutrition will improve Outcome: Progressing Goal: Progress toward achieving an optimal weight will improve Outcome: Progressing   Problem: Skin Integrity: Goal: Risk for impaired skin integrity will decrease Outcome: Progressing   Problem: Tissue Perfusion: Goal: Adequacy of tissue perfusion will improve Outcome: Progressing   Problem: Education: Goal: Knowledge of risk factors and measures for prevention of condition will improve Outcome: Progressing   Problem: Coping: Goal: Psychosocial and spiritual needs will be supported Outcome: Progressing   Problem: Respiratory: Goal: Will maintain a patent airway Outcome: Progressing Goal: Complications related to the disease process, condition or treatment will be avoided or minimized Outcome: Progressing   

## 2021-12-29 NOTE — Anesthesia Procedure Notes (Signed)
Procedure Name: General with mask airway Date/Time: 12/29/2021 4:05 PM  Performed by: Viann Fish B, CRNAPre-anesthesia Checklist: Suction available, Emergency Drugs available, Patient identified and Patient being monitored Oxygen Delivery Method: Circle system utilized and Nasal cannula Ventilation: Mask ventilation without difficulty Comments: Assisted ventilation with mask due to desaturation

## 2021-12-29 NOTE — Op Note (Signed)
[facility] Patient Name: [name] Procedure Date : 12/29/2021 MRN: [id number] Attending MD: Ladene Artist , MD Date of Birth: 08/11/1985 CSN: [id number] Age: 36 Admit Type: Inpatient Procedure:                Upper GI endoscopy Indications:              Epigastric abdominal pain, Right upper quadrant                            abdominal pain, Dysphagia, Hematemesis, Heme                            positive stool, Nausea with vomiting Providers:                Pricilla Riffle. Fuller Plan, MD, Jaci Carrel, RN, Luan Moore, Technician, Brien Mates, RNFA Referring MD:             [facility] Medicines:                Monitored Anesthesia Care Complications:            No immediate complications. Estimated Blood Loss:     Estimated blood loss: none. Procedure:                Pre-Anesthesia Assessment:                           - Prior to the procedure, a History and Physical                            was performed, and patient medications and                            allergies were reviewed. The patient's tolerance of                            previous anesthesia was also reviewed. The risks                            and benefits of the procedure and the sedation                            options and risks were discussed with the patient.                            All questions were answered, and informed consent                            was obtained. Prior Anticoagulants: The patient has                            taken no previous anticoagulant or antiplatelet  agents. ASA Grade Assessment: III - A patient with                            severe systemic disease. After reviewing the risks                            and benefits, the patient was deemed in                            satisfactory condition to undergo the procedure.                           After obtaining informed consent, the endoscope was                             passed under direct vision. Throughout the                            procedure, the patient's blood pressure, pulse, and                            oxygen saturations were monitored continuously. The                            [id number]-H190 ([id number]) Olympus endoscope was introduced                            through the mouth, and advanced to the second part                            of duodenum. The upper GI endoscopy was                            accomplished without difficulty. The patient                            tolerated the procedure well. Scope In: Scope Out: Findings:      LA Grade D (one or more mucosal breaks involving at least 75% of       esophageal circumference) esophagitis with no bleeding was found in the       mid and distal esophagus.      One benign-appearing, intrinsic mild stenosis was found at the       gastroesophageal junction. This stenosis measured 1.4 cm (inner       diameter) x less than one cm (in length). The stenosis was traversed.       Not dilated due to severe esophagitis.      The exam of the esophagus was otherwise normal.      A medium-sized hiatal hernia was present.      Localized moderately erythematous mucosa without bleeding was found in       the gastric fundus c/w repeated vomiting.      The exam of the stomach was otherwise normal.      The duodenal bulb and second portion of the duodenum were normal.      -  No blood noted in the UGI tract. Impression:               - LA Grade D reflux esophagitis with no bleeding.                           - Benign-appearing esophageal stenosis.                           - Medium-sized hiatal hernia.                           - Erythematous mucosa in the gastric fundus.                           - Normal duodenal bulb and second portion of the                            duodenum.                           - No specimens collected. Recommendation:           - Return patient to hospital  ward for ongoing care.                           - Mild UGI bleeding, heme + stool from severe                            esophagitis or from gastropathy secondary to                            vomiting.                           - Full liquid diet today, then advance as tolerated                            to soft diet.                           - Continue present medications including                            pantoprazole IV bid for now then PO bid long term                            and odansetron IV/PO q6h.                           - GI signing off.                           - Outpatient GI follow up with [facility] as planned. Procedure Code(s):        --- Professional ---  63893, Esophagogastroduodenoscopy, flexible,                            transoral; diagnostic, including collection of                            specimen(s) by brushing or washing, when performed                            (separate procedure) Diagnosis Code(s):        --- Professional ---                           K21.00, Gastro-esophageal reflux disease with                            esophagitis, without bleeding                           K22.2, Esophageal obstruction                           K44.9, Diaphragmatic hernia without obstruction or                            gangrene                           K31.89, Other diseases of stomach and duodenum                           R10.13, Epigastric pain                           R10.11, Right upper quadrant pain                           R13.10, Dysphagia, unspecified                           K92.0, Hematemesis                           R19.5, Other fecal abnormalities                           R11.2, Nausea with vomiting, unspecified CPT copyright 2019 American Medical Association. All rights reserved. The codes documented in this report are preliminary and upon coder review may  be revised to meet current compliance  requirements. Ladene Artist, MD 12/29/2021 4:25:54 PM This report has been signed electronically. Number of Addenda: 0

## 2021-12-29 NOTE — Progress Notes (Signed)
Pt. Off unit for EGD. 

## 2021-12-29 NOTE — Progress Notes (Signed)
Tap water enema given for EGD procedure.

## 2021-12-29 NOTE — Anesthesia Preprocedure Evaluation (Signed)
Anesthesia Evaluation  Patient identified by MRN, date of birth, ID band Patient awake    Reviewed: Allergy & Precautions, NPO status , Patient's Chart, lab work & pertinent test results  Airway Mallampati: II  TM Distance: >3 FB Neck ROM: Full    Dental no notable dental hx.    Pulmonary neg pulmonary ROS,  covid positive   Pulmonary exam normal breath sounds clear to auscultation       Cardiovascular hypertension, Pt. on medications and Pt. on home beta blockers Normal cardiovascular exam Rhythm:Regular Rate:Normal     Neuro/Psych Seizures -,  negative psych ROS   GI/Hepatic Neg liver ROS, hiatal hernia, GERD  Medicated,  Endo/Other  diabetes, Insulin Dependent  Renal/GU DialysisRenal disease  negative genitourinary   Musculoskeletal negative musculoskeletal ROS (+)   Abdominal   Peds negative pediatric ROS (+)  Hematology  (+) Blood dyscrasia, anemia ,   Anesthesia Other Findings   Reproductive/Obstetrics negative OB ROS                             Anesthesia Physical Anesthesia Plan  ASA: 3  Anesthesia Plan: MAC   Post-op Pain Management: Minimal or no pain anticipated   Induction: Intravenous  PONV Risk Score and Plan: 2 and Propofol infusion and Treatment may vary due to age or medical condition  Airway Management Planned: Simple Face Mask  Additional Equipment:   Intra-op Plan:   Post-operative Plan:   Informed Consent: I have reviewed the patients History and Physical, chart, labs and discussed the procedure including the risks, benefits and alternatives for the proposed anesthesia with the patient or authorized representative who has indicated his/her understanding and acceptance.     Dental advisory given  Plan Discussed with: CRNA and Surgeon  Anesthesia Plan Comments:         Anesthesia Quick Evaluation

## 2021-12-29 NOTE — Progress Notes (Signed)
PROGRESS NOTE    [name]  LKG:[id number] DOB: 1985-10-27 DOA: 12/26/2021 PCP: [name], MD   Brief Narrative:  HPI: [name] is a 36 y.o. female with medical history significant of ESRD, DM, pseudoseizures, gastroparesis, esophagitis, failed kidney transplant.   Pt in to ED at med center yesterday with hematemesis and melena.  Hemoccult positive.   COVID positive as well.   HGB looked okay yesterday.  Transfer for GIB requested but unfortunately due to lack of bed availability patient not transferred until today.   Question of DKA while in ED.  Had anion gap yesterday, given Blakely insulin, anion gap resolved on BMP this morning at 0800.    Assessment & Plan:   Principal Problem:   GI bleeding Active Problems:   COVID-19 virus infection   ESRD on hemodialysis ([place])   Uncontrolled type 1 diabetes mellitus with hyperglycemia (HCC)   Essential hypertension, benign   Liver lesion   Upper GI bleed  Upper GI bleeding/hematemesis and melena/acute blood loss anemia: Patient presented with hematemesis and melena prior to admission.  Hemoglobin around 12 upon presentation, down to 8.5 today.  Continue Protonix twice daily.  Transfuse if less than 7.  Check every 12 hours.  GI on board and plans for EGD and flex sigmoidoscopy this afternoon.  GI recommends outpatient colonoscopy with GI at [facility].  Possible infectious colitis: Although this could very well be as a consequence of COVID infection however, continue ciprofloxacin and Flagyl.   COVID-19 virus infection: She is asymptomatic and not hypoxic.  Chest imaging shows atelectasis and not groundglass opacities.  Continue molnupiravir, no indication of dexamethasone.  Patient was encouraged to prone, out of bed to chair, to use incentive spirometry and flutter valve.  For some reason, she has been placed on oxygen, she is saturating 100%, she is not hypoxic.  Nurse has been advised to wean her to room air.  Lungs clear to  auscultation.   Uncontrolled type 1 diabetes mellitus with hyperglycemia ([place]): Appears to be taking 20 units of Lantus at home.  Hemoglobin A1c 9.2.  Currently on Semglee 5 units and SSI and blood sugar very well controlled.   ESRD on hemodialysis [facility]): Nephrology on board.   Liver lesion on Multiple liver lesions. Needs follow up MRI as outpt.   Essential hypertension, benign: Only on metoprolol which we will continue.  Controlled at the moment.  DVT prophylaxis: SCDs Start: 12/27/21 2112   Code Status: Full Code  Family Communication:  None present at bedside.  Plan of care discussed with patient in length and he/she verbalized understanding and agreed with it.  Status is: Inpatient Remains inpatient appropriate because: Scheduled for EGD and flexible sigmoidoscopy today.    Estimated body mass index is 27.75 kg/m as calculated from the following:   Height as of this encounter: 5\' 6"  (1.676 m).   Weight as of this encounter: 78 kg.    Nutritional Assessment: Body mass index is 27.75 kg/m.Marland Kitchen Seen by dietician.  I agree with the assessment and plan as outlined below: Nutrition Status: Nutrition Problem: Inadequate oral intake Etiology: dysphagia, decreased appetite Signs/Symptoms: per patient/family report Interventions: Refer to RD note for recommendations  . Skin Assessment: I have examined the patient's skin and I agree with the wound assessment as performed by the wound care RN as outlined below:    Consultants:  GI nephrology  Procedures:  None  Antimicrobials:  Anti-infectives (From admission, onward)    Start  Dose/Rate Route Frequency Ordered Stop   12/28/21 1800  ciprofloxacin (CIPRO) IVPB 400 mg        400 mg 200 mL/hr over 60 Minutes Intravenous Every 24 hours 12/28/21 1022     12/28/21 1115  metroNIDAZOLE (FLAGYL) IVPB 500 mg        500 mg 100 mL/hr over 60 Minutes Intravenous Every 12 hours 12/28/21 1022     12/27/21 2215  molnupiravir EUA  (LAGEVRIO) capsule 800 mg        4 capsule Oral 2 times daily 12/27/21 2117 01/01/22 2159         Subjective:  Patient seen and examined.  Only complains of abdominal pain.  No shortness of breath.  No other complaint.  Objective: Vitals:   12/28/21 0320 12/28/21 1941 12/29/21 0300 12/29/21 0731  BP: (!) 142/89 (!) 147/99 (!) 142/88 (!) 125/95  Pulse: 81 89 87   Resp: 15 18 16    Temp: 98.6 F (37 C) 98.8 F (37.1 C) 97.8 F (36.6 C) 98.3 F (36.8 C)  TempSrc: Oral Oral Oral Oral  SpO2:  100% 100%   Weight: 78 kg     Height: 5\' 6"  (1.676 m)       Intake/Output Summary (Last 24 hours) at 12/29/2021 1302 Last data filed at 12/29/2021 1100 Gross per 24 hour  Intake 360 ml  Output --  Net 360 ml    Filed Weights   12/28/21 0320  Weight: 78 kg    Examination:  General exam: Appears calm and comfortable  Respiratory system: Clear to auscultation. Respiratory effort normal. Cardiovascular system: S1 & S2 heard, RRR. No JVD, murmurs, rubs, gallops or clicks. No pedal edema. Gastrointestinal system: Abdomen is nondistended, soft and epigastric and right upper quadrant tenderness. No organomegaly or masses felt. Normal bowel sounds heard. Central nervous system: Alert and oriented. No focal neurological deficits. Extremities: Symmetric 5 x 5 power. Skin: No rashes, lesions or ulcers.  Psychiatry: Judgement and insight appear normal. Mood & affect appropriate.   Data Reviewed: I have personally reviewed following labs and imaging studies  CBC: Recent Labs  Lab 12/26/21 0012 12/26/21 1534 12/28/21 0314 12/28/21 1007 12/28/21 1312 12/28/21 1652 12/29/21 0319  WBC 11.4* 12.5* 11.4*  --  8.5  --  7.5  NEUTROABS 9.8* 10.1* 8.3*  --   --   --  5.0  HGB 12.4 12.5 8.8* 9.0* 9.7* 8.3* 8.5*  HCT 36.9 39.3 27.7* 27.9* 29.7* 25.4* 25.2*  MCV 99.5 102.3* 101.8*  --  101.4*  --  102.4*  PLT 196 244 172  --  179  --  149*    Basic Metabolic Panel: Recent Labs  Lab  12/26/21 1445 12/27/21 0813 12/28/21 0314 12/28/21 1007 12/29/21 0319  NA 138 139 135 131* 133*  K 3.9 4.5 3.9 3.7 4.2  CL 91* 99 97* 95* 99  CO2 27 29 25 24  20*  GLUCOSE 398* 208* 133* 145* 129*  BUN 24* 39* 53* 55* 60*  CREATININE 4.95* 6.53* 8.42* 8.81* 10.00*  CALCIUM 10.2 8.8* 7.7* 7.3* 6.9*  MG  --   --  1.9  --   --   PHOS  --   --  4.9* 4.4  --     GFR: Estimated Creatinine Clearance: 8 mL/min (A) (by C-G formula based on SCr of 10 mg/dL (H)). Liver Function Tests: Recent Labs  Lab 12/26/21 0012 12/26/21 1445 12/28/21 0314 12/28/21 1007 12/29/21 0319  AST 22 17 33  --  24  ALT 16 10 26   --  21  ALKPHOS 65 55 50  --  42  BILITOT 0.9 0.7 0.5  --  0.6  PROT 9.2* 9.7* 7.6  --  6.8  ALBUMIN 4.3 4.7 3.6 3.7 3.1*    Recent Labs  Lab 12/26/21 0012 12/26/21 1445  LIPASE 69* 47    No results for input(s): "AMMONIA" in the last 168 hours. Coagulation Profile: Recent Labs  Lab 12/26/21 1534  INR 1.2    Cardiac Enzymes: No results for input(s): "CKTOTAL", "CKMB", "CKMBINDEX", "TROPONINI" in the last 168 hours. BNP (last 3 results) No results for input(s): "PROBNP" in the last 8760 hours. HbA1C: Recent Labs    12/28/21 0313  HGBA1C 9.2*    CBG: Recent Labs  Lab 12/28/21 2110 12/28/21 2314 12/29/21 0412 12/29/21 0727 12/29/21 1149  GLUCAP 145* 102* 143* 130* 87    Lipid Profile: No results for input(s): "CHOL", "HDL", "LDLCALC", "TRIG", "CHOLHDL", "LDLDIRECT" in the last 72 hours. Thyroid Function Tests: No results for input(s): "TSH", "T4TOTAL", "FREET4", "T3FREE", "THYROIDAB" in the last 72 hours. Anemia Panel: No results for input(s): "VITAMINB12", "FOLATE", "FERRITIN", "TIBC", "IRON", "RETICCTPCT" in the last 72 hours. Sepsis Labs: Recent Labs  Lab 12/28/21 0313  PROCALCITON 3.24     Recent Results (from the past 240 hour(s))  Resp Panel by RT-PCR (Flu A&B, Covid) Anterior Nasal Swab     Status: Abnormal   Collection Time: 12/27/21  11:18 AM   Specimen: Anterior Nasal Swab  Result Value Ref Range Status   SARS Coronavirus 2 by RT PCR POSITIVE (A) NEGATIVE Final    Comment: (NOTE) SARS-CoV-2 target nucleic acids are DETECTED.  The SARS-CoV-2 RNA is generally detectable in upper respiratory specimens during the acute phase of infection. Positive results are indicative of the presence of the identified virus, but do not rule out bacterial infection or co-infection with other pathogens not detected by the test. Clinical correlation with patient history and other diagnostic information is necessary to determine patient infection status. The expected result is Negative.  Fact Sheet for Patients: EntrepreneurPulse.com.au  Fact Sheet for Healthcare Providers: IncredibleEmployment.be  This test is not yet approved or cleared by the [place] FDA and  has been authorized for detection and/or diagnosis of SARS-CoV-2 by FDA under an Emergency Use Authorization (EUA).  This EUA will remain in effect (meaning this test can be used) for the duration of  the COVID-19 declaration under Section 564(b)(1) of the A ct, 21 U.S.C. section 360bbb-3(b)(1), unless the authorization is terminated or revoked sooner.     Influenza A by PCR NEGATIVE NEGATIVE Final   Influenza B by PCR NEGATIVE NEGATIVE Final    Comment: (NOTE) The Xpert Xpress SARS-CoV-2/FLU/RSV plus assay is intended as an aid in the diagnosis of influenza from Nasopharyngeal swab specimens and should not be used as a sole basis for treatment. Nasal washings and aspirates are unacceptable for Xpert Xpress SARS-CoV-2/FLU/RSV testing.  Fact Sheet for Patients: EntrepreneurPulse.com.au  Fact Sheet for Healthcare Providers: IncredibleEmployment.be  This test is not yet approved or cleared by the [place] FDA and has been authorized for detection and/or diagnosis of SARS-CoV-2 by FDA  under an Emergency Use Authorization (EUA). This EUA will remain in effect (meaning this test can be used) for the duration of the COVID-19 declaration under Section 564(b)(1) of the Act, 21 U.S.C. section 360bbb-3(b)(1), unless the authorization is terminated or revoked.  Performed at [facility], [address] [place], [place]  [address]      Radiology Studies: CT Angio Chest/Abd/Pel for Dissection W and/or Wo Contrast  Result Date: 12/27/2021 CLINICAL DATA:  Acute aortic syndrome suspected EXAM: CT ANGIOGRAPHY CHEST, ABDOMEN AND PELVIS TECHNIQUE: Non-contrast CT of the chest was initially obtained. Multidetector CT imaging through the chest, abdomen and pelvis was performed using the standard protocol during bolus administration of intravenous contrast. Multiplanar reconstructed images and MIPs were obtained and reviewed to evaluate the vascular anatomy. RADIATION DOSE REDUCTION: This exam was performed according to the departmental dose-optimization program which includes automated exposure control, adjustment of the mA and/or kV according to patient size and/or use of iterative reconstruction technique. CONTRAST:  150mL OMNIPAQUE IOHEXOL 350 MG/ML SOLN COMPARISON:  Same day chest radiograph, CT abdomen/pelvis 1 day prior FINDINGS: CTA CHEST FINDINGS Cardiovascular: There is no evidence of acute intramural hematoma on the initial noncontrast images. There is no evidence of aortic aneurysm or dissection of the postcontrast images. There is no evidence of pulmonary embolism. The heart size is normal. Coronary artery calcifications are seen, accelerated for age. Mediastinum/Nodes: The thyroid is unremarkable. The esophagus is grossly unremarkable. There is a prominent precarinal lymph node measuring up to 1.3 cm in the axial plane, also present in the PET-CT from 2010 and only minimally increased in size, nonspecific. There is no mediastinal, hilar, or axillary lymphadenopathy.  Lungs/Pleura: The trachea and central airways are patent. There are new patchy and consolidative opacities in both lower lobes not present on the CT abdomen/pelvis from 1 day prior. The upper lobes and right middle lobe are well-aerated. There is no pulmonary edema. There is no pleural effusion or pneumothorax. There are no suspicious nodules. Musculoskeletal: There is no acute osseous abnormality or suspicious osseous lesion. Review of the MIP images confirms the above findings. CTA ABDOMEN AND PELVIS FINDINGS VASCULAR Aorta: Normal caliber aorta without aneurysm, dissection, vasculitis or significant stenosis. Celiac: Patent without evidence of aneurysm, dissection, vasculitis or significant stenosis. SMA: Patent without evidence of aneurysm, dissection, vasculitis or significant stenosis. Renals: A right renal artery is not identified. A markedly diminutive left renal artery is seen. There is corresponding marked left renal atrophy. IMA: Patent without evidence of aneurysm, dissection, vasculitis or significant stenosis. Inflow: Patent without evidence of aneurysm, dissection, vasculitis or significant stenosis. Veins: No obvious venous abnormality within the limitations of this arterial phase study. Review of the MIP images confirms the above findings. NON-VASCULAR Hepatobiliary: Ill-defined hypodense lesions are again seen in the liver (for example 5-89, 5-96), incompletely characterized. There is no acute abnormality in the liver. The gallbladder is surgically absent. There is no biliary ductal dilatation. Pancreas: Unremarkable. Spleen: Unremarkable. Adrenals/Urinary Tract: The adrenals are unremarkable. The right kidney is absent. The markedly atrophic left kidney is unchanged. There are no focal lesions, stones, or hydroureteronephrosis. The bladder is decompressed. Stomach/Bowel: The stomach is unremarkable. There is no evidence of bowel obstruction. Mild wall thickening with mild fat stranding around the  cecum is similar to the study from 1 day prior. Suspected wall thickening and mild surrounding inflammatory changes are also again seen around the rectum, also not significantly changed. There is no new or progressive abnormal bowel wall thickening or inflammatory change. Appendix is normal. Lymphatic: There is no abdominopelvic lymphadenopathy. Reproductive: Uterus is surgically absent. There is no adnexal mass. Other: Fibrotic scarring in the right lower quadrant is unchanged. There is no ascites or free air. Musculoskeletal: There is no acute osseous abnormality or suspicious osseous lesion. Review of the MIP images confirms  the above findings. IMPRESSION: 1. No evidence of aortic aneurysm or dissection. 2. Patchy and consolidative opacities in both lung bases favored to reflect atelectasis given normal appearance of the lung bases on the CT abdomen/pelvis from 1 day prior; however, developing multifocal infection is not entirely excluded. 3. Unchanged mild wall thickening and inflammatory change about the cecum and rectum suspicious for nonspecific infectious or inflammatory colitis. 4. Coronary artery calcifications, accelerated for age. 5. Ill-defined hypodense liver lesions again seen for which nonemergent outpatient multiphase abdominal MRI with and without contrast is recommended for characterization. Electronically Signed   By: Valetta Mole M.D.   On: 12/27/2021 13:37    Scheduled Meds:  calcium carbonate (dosed in mg elemental calcium)  500 mg of elemental calcium Oral Daily   Chlorhexidine Gluconate Cloth  6 each Topical Q0600   darbepoetin (ARANESP) injection - DIALYSIS  150 mcg Intravenous Q Tue-HD   feeding supplement  1 Container Oral TID BM   insulin aspart  0-9 Units Subcutaneous Q4H   insulin glargine-yfgn  5 Units Subcutaneous QHS   metoprolol tartrate  25 mg Oral BID   molnupiravir EUA  4 capsule Oral BID   pantoprazole  40 mg Intravenous Q12H   sevelamer carbonate  2.4 g Oral TID  with meals   simvastatin  20 mg Oral QHS   Continuous Infusions:  anticoagulant sodium citrate     ciprofloxacin 400 mg (12/28/21 1834)   metronidazole 500 mg (12/29/21 1241)     LOS: 1 day   Darliss Cheney, MD [facility]  12/29/2021, 1:02 PM   *Please note that this is a verbal dictation therefore any spelling or grammatical errors are due to the "[place] [employer]" system interpretation.  Please page via [place] and do not message via secure chat for urgent patient care matters. Secure chat can be used for non urgent patient care matters.  How to contact the [facility] Attending or Consulting provider [place] or covering provider during after hours [place], for this patient?  Check the care team in [facility] and look for a) attending/consulting TRH provider listed and b) the [facility] team listed. Page or secure chat [phone]. Log into [email or web address] and use [place]'s universal password to access. If you do not have the password, please contact the hospital operator. Locate the [facility] provider you are looking for under [facility] and page to a number that you can be directly reached. If you still have difficulty reaching the provider, please page the [facility] [place] (Director on Call) for the Hospitalists listed on amion for assistance.

## 2021-12-29 NOTE — Inpatient Diabetes Management (Signed)
Inpatient Diabetes Program Recommendations  AACE/ADA: New Consensus Statement on Inpatient Glycemic Control (2015)  Target Ranges:  Prepandial:   less than 140 mg/dL      Peak postprandial:   less than 180 mg/dL (1-2 hours)      Critically ill patients:  140 - 180 mg/dL   Lab Results  Component Value Date   GLUCAP 130 (H) 12/29/2021   HGBA1C 9.2 (H) 12/28/2021    Review of Glycemic Control  Latest Reference Range & Units 12/28/21 19:35 12/28/21 21:10 12/28/21 23:14 12/29/21 04:12 12/29/21 07:27  Glucose-Capillary 70 - 99 mg/dL 192 (H) 145 (H) 102 (H) 143 (H) 130 (H)  (H): Data is abnormally high Diabetes history: Type 1 DM Outpatient Diabetes medications: Tresiba 5-10 units QD, Novolog 0-15 units TID Current orders for Inpatient glycemic control: Semglee 5 units QHS, Novolog 0-9 units Q4H Solumedrol 40 mg x 1   Inpatient Diabetes Program Recommendations:     Consider increasing Semglee to 8 units QD.   Thanks, Bronson Curb, MSN, RNC-OB Diabetes Coordinator [phone] (8a-5p)

## 2021-12-29 NOTE — Plan of Care (Signed)
  Problem: Health Behavior/Discharge Planning: Goal: Ability to manage health-related needs will improve Outcome: Progressing   Problem: Clinical Measurements: Goal: Respiratory complications will improve Outcome: Progressing   Problem: Activity: Goal: Risk for activity intolerance will decrease Outcome: Progressing   Problem: Nutrition: Goal: Adequate nutrition will be maintained Outcome: Progressing   Problem: Coping: Goal: Level of anxiety will decrease Outcome: Progressing   Problem: Pain Managment: Goal: General experience of comfort will improve Outcome: Progressing

## 2021-12-29 NOTE — Op Note (Addendum)
[facility] Patient Name: [name] Procedure Date : 12/29/2021 MRN: [id number] Attending MD: Ladene Artist , MD Date of Birth: [birth year] CSN: [id number] Age: 36 Admit Type: Inpatient Procedure:                Flexible Sigmoidoscopy Indications:              Abnormal CT of the GI tract (rectum) Providers:                Pricilla Riffle. Fuller Plan, MD, Jaci Carrel, RN, Luan Moore, Technician, Brien Mates, RNFA Referring MD:             [facility] Medicines:                Monitored Anesthesia Care Complications:            No immediate complications. Estimated Blood Loss:     Estimated blood loss was minimal. Procedure:                Pre-Anesthesia Assessment:                           - Prior to the procedure, a History and Physical                            was performed, and patient medications and                            allergies were reviewed. The patient's tolerance of                            previous anesthesia was also reviewed. The risks                            and benefits of the procedure and the sedation                            options and risks were discussed with the patient.                            All questions were answered, and informed consent                            was obtained. Prior Anticoagulants: The patient has                            taken no previous anticoagulant or antiplatelet                            agents. ASA Grade Assessment: III - A patient with                            severe systemic disease. After reviewing the risks  and benefits, the patient was deemed in                            satisfactory condition to undergo the procedure.                           - Prior to the procedure, a History and Physical                            was performed, and patient medications and                            allergies were reviewed. The patient's tolerance  of                            previous anesthesia was also reviewed. The risks                            and benefits of the procedure and the sedation                            options and risks were discussed with the patient.                            All questions were answered, and informed consent                            was obtained. Prior Anticoagulants: The patient has                            taken no previous anticoagulant or antiplatelet                            agents. ASA Grade Assessment: III - A patient with                            severe systemic disease. After reviewing the risks                            and benefits, the patient was deemed in                            satisfactory condition to undergo the procedure.                           After obtaining informed consent, the scope was                            passed under direct vision. The [id number]-HQ190L                            ([id number]) Olympus colonoscope was introduced  through the anus and advanced to the the sigmoid                            colon. The flexible sigmoidoscopy was accomplished                            without difficulty. The patient tolerated the                            procedure well. The quality of the bowel                            preparation was fair. Scope In: 4:09:51 PM Scope Out: 4:14:18 PM Total Procedure Duration: 0 hours 4 minutes 27 seconds  Findings:      The perianal and digital rectal examinations were normal.      The entire examined colon to 35 cm in the sigmid appeared normal. Given       CT findings biopsies were taken of normal appearing rectum with a cold       forceps for histology. Impression:               - Preparation of the colon was fair.                           - The examined colon to 35 cm in the sigmiod is                            normal. Biopsied. Recommendation:           - Return patient to hospital ward  for ongoing care.                           - Await pathology results.                           - See EGD report for additional recommendations. Procedure Code(s):        --- Professional ---                           [phone], Sigmoidoscopy, flexible; with biopsy, single                            or multiple Diagnosis Code(s):        --- Professional ---                           R93.3, Abnormal findings on diagnostic imaging of                            other parts of digestive tract CPT copyright 2019 American Medical Association. All rights reserved. The codes documented in this report are preliminary and upon coder review may  be revised to meet current compliance requirements. Ladene Artist, MD 12/29/2021 4:29:12 PM This report has been signed electronically. Number of Addenda: 0

## 2021-12-29 NOTE — Transfer of Care (Signed)
Immediate Anesthesia Transfer of Care Note  Patient: [name]  Procedure(s) Performed: ESOPHAGOGASTRODUODENOSCOPY (EGD) WITH PROPOFOL FLEXIBLE SIGMOIDOSCOPY BIOPSY  Patient Location: ENDO  Anesthesia Type:MAC  Level of Consciousness: awake, alert  and oriented  Airway & Oxygen Therapy: Patient Spontanous Breathing and Patient connected to nasal cannula oxygen  Post-op Assessment: Report given to RN and Post -op Vital signs reviewed and stable  Post vital signs: Reviewed and stable  Last Vitals:  Vitals Value Taken Time  BP 117/79 12/29/21 1630  Temp 36.6 C 12/29/21 1620  Pulse 84 12/29/21 1637  Resp 18 12/29/21 1630  SpO2 92 % 12/29/21 1637  Vitals shown include unvalidated device data.  Last Pain:  Vitals:   12/29/21 1630  TempSrc:   PainSc: 0-No pain      Patients Stated Pain Goal: 2 (25/74/93 5521)  Complications: No notable events documented.

## 2021-12-29 NOTE — Progress Notes (Signed)
[facility] Progress Note   Subjective:   Patient seen and examined at bedside.  Continues to have abdominal pain and pain throughout her chest.  States the Dilaudid is not effective and requesting alternate pain medication. Melena is improving, stool appears lighter.  Denies SOB, orthopnea, edema and n/v/d.  Waiting to go to EGD.   Objective Vitals:   12/28/21 0320 12/28/21 1941 12/29/21 0300 12/29/21 0731  BP: (!) 142/89 (!) 147/99 (!) 142/88 (!) 125/95  Pulse: 81 89 87   Resp: 15 18 16    Temp: 98.6 F (37 C) 98.8 F (37.1 C) 97.8 F (36.6 C) 98.3 F (36.8 C)  TempSrc: Oral Oral Oral Oral  SpO2:  100% 100%   Weight: 78 kg     Height: 5\' 6"  (1.676 m)      Physical Exam General:WDWN female in NAD Heart:RRR, no mrg Lungs:+scattered rhonchi, nml WOB on RA Abdomen:soft, NTND Extremities:no LE edema Dialysis Access: LU AVF +b/t   Filed Weights   12/28/21 0320  Weight: 78 kg    Intake/Output Summary (Last 24 hours) at 12/29/2021 0929 Last data filed at 12/28/2021 1700 Gross per 24 hour  Intake 600 ml  Output --  Net 600 ml    Additional Objective Labs: Basic Metabolic Panel: Recent Labs  Lab 12/28/21 0314 12/28/21 1007 12/29/21 0319  NA 135 131* 133*  K 3.9 3.7 4.2  CL 97* 95* 99  CO2 25 24 20*  GLUCOSE 133* 145* 129*  BUN 53* 55* 60*  CREATININE 8.42* 8.81* 10.00*  CALCIUM 7.7* 7.3* 6.9*  PHOS 4.9* 4.4  --    Liver Function Tests: Recent Labs  Lab 12/26/21 1445 12/28/21 0314 12/28/21 1007 12/29/21 0319  AST 17 33  --  24  ALT 10 26  --  21  ALKPHOS 55 50  --  42  BILITOT 0.7 0.5  --  0.6  PROT 9.7* 7.6  --  6.8  ALBUMIN 4.7 3.6 3.7 3.1*   Recent Labs  Lab 12/26/21 0012 12/26/21 1445  LIPASE 69* 47   CBC: Recent Labs  Lab 12/26/21 0012 12/26/21 1534 12/28/21 0314 12/28/21 1007 12/28/21 1312 12/28/21 1652 12/29/21 0319  WBC 11.4* 12.5* 11.4*  --  8.5  --  7.5  NEUTROABS 9.8* 10.1* 8.3*  --   --   --  5.0  HGB 12.4  12.5 8.8*   < > 9.7* 8.3* 8.5*  HCT 36.9 39.3 27.7*   < > 29.7* 25.4* 25.2*  MCV 99.5 102.3* 101.8*  --  101.4*  --  102.4*  PLT 196 244 172  --  179  --  149*   < > = values in this interval not displayed.   CBG: Recent Labs  Lab 12/28/21 1935 12/28/21 2110 12/28/21 2314 12/29/21 0412 12/29/21 0727  GLUCAP 192* 145* 102* 143* 130*    Studies/Results: CT Angio Chest/Abd/Pel for Dissection W and/or Wo Contrast  Result Date: 12/27/2021 CLINICAL DATA:  Acute aortic syndrome suspected EXAM: CT ANGIOGRAPHY CHEST, ABDOMEN AND PELVIS TECHNIQUE: Non-contrast CT of the chest was initially obtained. Multidetector CT imaging through the chest, abdomen and pelvis was performed using the standard protocol during bolus administration of intravenous contrast. Multiplanar reconstructed images and MIPs were obtained and reviewed to evaluate the vascular anatomy. RADIATION DOSE REDUCTION: This exam was performed according to the departmental dose-optimization program which includes automated exposure control, adjustment of the mA and/or kV according to patient size and/or use of iterative reconstruction technique. CONTRAST:  119mL OMNIPAQUE IOHEXOL 350 MG/ML SOLN COMPARISON:  Same day chest radiograph, CT abdomen/pelvis 1 day prior FINDINGS: CTA CHEST FINDINGS Cardiovascular: There is no evidence of acute intramural hematoma on the initial noncontrast images. There is no evidence of aortic aneurysm or dissection of the postcontrast images. There is no evidence of pulmonary embolism. The heart size is normal. Coronary artery calcifications are seen, accelerated for age. Mediastinum/Nodes: The thyroid is unremarkable. The esophagus is grossly unremarkable. There is a prominent precarinal lymph node measuring up to 1.3 cm in the axial plane, also present in the PET-CT from 2010 and only minimally increased in size, nonspecific. There is no mediastinal, hilar, or axillary lymphadenopathy. Lungs/Pleura: The trachea  and central airways are patent. There are new patchy and consolidative opacities in both lower lobes not present on the CT abdomen/pelvis from 1 day prior. The upper lobes and right middle lobe are well-aerated. There is no pulmonary edema. There is no pleural effusion or pneumothorax. There are no suspicious nodules. Musculoskeletal: There is no acute osseous abnormality or suspicious osseous lesion. Review of the MIP images confirms the above findings. CTA ABDOMEN AND PELVIS FINDINGS VASCULAR Aorta: Normal caliber aorta without aneurysm, dissection, vasculitis or significant stenosis. Celiac: Patent without evidence of aneurysm, dissection, vasculitis or significant stenosis. SMA: Patent without evidence of aneurysm, dissection, vasculitis or significant stenosis. Renals: A right renal artery is not identified. A markedly diminutive left renal artery is seen. There is corresponding marked left renal atrophy. IMA: Patent without evidence of aneurysm, dissection, vasculitis or significant stenosis. Inflow: Patent without evidence of aneurysm, dissection, vasculitis or significant stenosis. Veins: No obvious venous abnormality within the limitations of this arterial phase study. Review of the MIP images confirms the above findings. NON-VASCULAR Hepatobiliary: Ill-defined hypodense lesions are again seen in the liver (for example 5-89, 5-96), incompletely characterized. There is no acute abnormality in the liver. The gallbladder is surgically absent. There is no biliary ductal dilatation. Pancreas: Unremarkable. Spleen: Unremarkable. Adrenals/Urinary Tract: The adrenals are unremarkable. The right kidney is absent. The markedly atrophic left kidney is unchanged. There are no focal lesions, stones, or hydroureteronephrosis. The bladder is decompressed. Stomach/Bowel: The stomach is unremarkable. There is no evidence of bowel obstruction. Mild wall thickening with mild fat stranding around the cecum is similar to the  study from 1 day prior. Suspected wall thickening and mild surrounding inflammatory changes are also again seen around the rectum, also not significantly changed. There is no new or progressive abnormal bowel wall thickening or inflammatory change. Appendix is normal. Lymphatic: There is no abdominopelvic lymphadenopathy. Reproductive: Uterus is surgically absent. There is no adnexal mass. Other: Fibrotic scarring in the right lower quadrant is unchanged. There is no ascites or free air. Musculoskeletal: There is no acute osseous abnormality or suspicious osseous lesion. Review of the MIP images confirms the above findings. IMPRESSION: 1. No evidence of aortic aneurysm or dissection. 2. Patchy and consolidative opacities in both lung bases favored to reflect atelectasis given normal appearance of the lung bases on the CT abdomen/pelvis from 1 day prior; however, developing multifocal infection is not entirely excluded. 3. Unchanged mild wall thickening and inflammatory change about the cecum and rectum suspicious for nonspecific infectious or inflammatory colitis. 4. Coronary artery calcifications, accelerated for age. 5. Ill-defined hypodense liver lesions again seen for which nonemergent outpatient multiphase abdominal MRI with and without contrast is recommended for characterization. Electronically Signed   By: Valetta Mole M.D.   On: 12/27/2021 13:37    Medications:  anticoagulant sodium citrate     ciprofloxacin 400 mg (12/28/21 1834)   metronidazole 500 mg (12/28/21 2116)    calcium carbonate (dosed in mg elemental calcium)  500 mg of elemental calcium Oral Daily   Chlorhexidine Gluconate Cloth  6 each Topical Q0600   darbepoetin (ARANESP) injection - DIALYSIS  150 mcg Intravenous Q Tue-HD   feeding supplement  1 Container Oral TID BM   insulin aspart  0-9 Units Subcutaneous Q4H   insulin glargine-yfgn  5 Units Subcutaneous QHS   metoprolol tartrate  25 mg Oral BID   molnupiravir EUA  4 capsule  Oral BID   pantoprazole  40 mg Intravenous Q12H   sevelamer carbonate  2.4 g Oral TID with meals   simvastatin  20 mg Oral QHS    Dialysis Orders: SW TTS 4hrs, 300/500 81kg 2K 3Ca  16G LU AVG Heparin 4000 bolus, 2500 intermittent  Mircera 54mcg q2wks - last 10/3 Hectorol 51mcg qHD     Assessment/Plan: GI bleeding - plan for EGD today. Holding heparin with HD.    2. Possible infectious colitis - noted on CT. On Cipro and Flagyl.    3. ESRD - on HD TTS.  HD today per regular schedule. Hold heparin.   4. COVID-19 infection - Asymptomatic. On molnupiravir. Pain control per PMD.    5. Liver Lesions - noted on abd CT.  Will need OP MRI.   6. Hypertension/Volume - on lopressor at home.  Does not appear volume overloaded on exam. CXR & CT chest w/atelectasis vs consolidation, possible L pleural effusion.  Under EDW last HD, possible weight loss, plan for net UF 1-2L as tolerated.   7. Anemia of CKD - Hgb drop 8.5 in setting of GIB.  Increase dose aranesp 164mcg with HD today.   8. Secondary Hyperparathyroidism - Ca 6.9, Phos 4.9. Continue Renvela powder, Ca carbonate and hectorol.   9. Nutrition - Currently NPO. Renal diet w/fluid restrictions when advanced. Alb 3.1 - Boost ordered.    10. Uncontrolled DMT1 - insulin regimen per primary team  Jen Mow, PA-C [facility] 12/29/2021,9:29 AM  LOS: 1 day

## 2021-12-29 NOTE — Interval H&P Note (Signed)
History and Physical Interval Note:  12/29/2021 3:28 PM  [name]  has presented today for surgery, with the diagnosis of Streaky bloody emesis.  Right abdominal pain.  Dark and blood the stool..  The various methods of treatment have been discussed with the patient and family. After consideration of risks, benefits and other options for treatment, the patient has consented to  Procedure(s): ESOPHAGOGASTRODUODENOSCOPY (EGD) WITH PROPOFOL (N/A) FLEXIBLE SIGMOIDOSCOPY (N/A) as a surgical intervention.  The patient's history has been reviewed, patient examined, no change in status, stable for surgery.  I have reviewed the patient's chart and labs.  Questions were answered to the patient's satisfaction.     Pricilla Riffle. Fuller Plan

## 2021-12-30 ENCOUNTER — Ambulatory Visit: Payer: Medicare Other | Admitting: Internal Medicine

## 2021-12-30 DIAGNOSIS — K921 Melena: Secondary | ICD-10-CM | POA: Diagnosis not present

## 2021-12-30 LAB — CBC WITH DIFFERENTIAL/PLATELET
Abs Immature Granulocytes: 0.03 10*3/uL (ref 0.00–0.07)
Basophils Absolute: 0 10*3/uL (ref 0.0–0.1)
Basophils Relative: 0 %
Eosinophils Absolute: 0.2 10*3/uL (ref 0.0–0.5)
Eosinophils Relative: 3 %
HCT: 25.5 % — ABNORMAL LOW (ref 36.0–46.0)
Hemoglobin: 8.8 g/dL — ABNORMAL LOW (ref 12.0–15.0)
Immature Granulocytes: 1 %
Lymphocytes Relative: 24 %
Lymphs Abs: 1.4 10*3/uL (ref 0.7–4.0)
MCH: 35.5 pg — ABNORMAL HIGH (ref 26.0–34.0)
MCHC: 34.5 g/dL (ref 30.0–36.0)
MCV: 102.8 fL — ABNORMAL HIGH (ref 80.0–100.0)
Monocytes Absolute: 0.3 10*3/uL (ref 0.1–1.0)
Monocytes Relative: 6 %
Neutro Abs: 3.7 10*3/uL (ref 1.7–7.7)
Neutrophils Relative %: 66 %
Platelets: 154 10*3/uL (ref 150–400)
RBC: 2.48 MIL/uL — ABNORMAL LOW (ref 3.87–5.11)
RDW: 14.7 % (ref 11.5–15.5)
WBC: 5.6 10*3/uL (ref 4.0–10.5)
nRBC: 0 % (ref 0.0–0.2)

## 2021-12-30 LAB — COMPREHENSIVE METABOLIC PANEL
ALT: 22 U/L (ref 0–44)
AST: 26 U/L (ref 15–41)
Albumin: 3.1 g/dL — ABNORMAL LOW (ref 3.5–5.0)
Alkaline Phosphatase: 52 U/L (ref 38–126)
Anion gap: 15 (ref 5–15)
BUN: 39 mg/dL — ABNORMAL HIGH (ref 6–20)
CO2: 24 mmol/L (ref 22–32)
Calcium: 7.3 mg/dL — ABNORMAL LOW (ref 8.9–10.3)
Chloride: 96 mmol/L — ABNORMAL LOW (ref 98–111)
Creatinine, Ser: 8.25 mg/dL — ABNORMAL HIGH (ref 0.44–1.00)
GFR, Estimated: 6 mL/min — ABNORMAL LOW (ref >60)
Glucose, Bld: 151 mg/dL — ABNORMAL HIGH (ref 70–99)
Potassium: 3.6 mmol/L (ref 3.5–5.1)
Sodium: 135 mmol/L (ref 135–145)
Total Bilirubin: 0.6 mg/dL (ref 0.3–1.2)
Total Protein: 6.8 g/dL (ref 6.5–8.1)

## 2021-12-30 LAB — GLUCOSE, CAPILLARY
Glucose-Capillary: 168 mg/dL — ABNORMAL HIGH (ref 70–99)
Glucose-Capillary: 77 mg/dL (ref 70–99)
Glucose-Capillary: 134 mg/dL — ABNORMAL HIGH (ref 70–99)
Glucose-Capillary: 237 mg/dL — ABNORMAL HIGH (ref 70–99)
Glucose-Capillary: 162 mg/dL — ABNORMAL HIGH (ref 70–99)

## 2021-12-30 LAB — C-REACTIVE PROTEIN: CRP: 1 mg/dL — ABNORMAL HIGH (ref <1.0)

## 2021-12-30 NOTE — Progress Notes (Signed)
[facility] Progress Note   Subjective:   Patient seen and examined at bedside.  Reports tolerating dialysis well yesterday.  Continues to have abdominal pain.  Reports 2 BM with bright red blood this AM around 4.  Repeat Hgb ordered for this afternoon.  No acute findings on Flex Sig; EGD with esophagitis yesterday.    Objective Vitals:   12/29/21 2000 12/29/21 2034 12/29/21 2222 12/30/21 0453  BP: 117/89 137/78 (!) 164/90 (!) 154/82  Pulse:   81 83  Resp:   15 14  Temp:  98 F (36.7 C) 98.2 F (36.8 C) 98.3 F (36.8 C)  TempSrc:  Axillary Oral Oral  SpO2:   95% 100%  Weight:  75.6 kg    Height:       Physical Exam General:WDWN female in NAD Heart:RRR, no mrg Lungs:CTAB, nml WOB on RA Abdomen:soft, +tenderness, ND, +BS Extremities:no LE edema Dialysis Access: LU AVF +b/t   Filed Weights   12/29/21 1528 12/29/21 1755 12/29/21 2034  Weight: 78 kg 78 kg 75.6 kg    Intake/Output Summary (Last 24 hours) at 12/30/2021 1045 Last data filed at 12/29/2021 2034 Gross per 24 hour  Intake 0 ml  Output 3000 ml  Net -3000 ml    Additional Objective Labs: Basic Metabolic Panel: Recent Labs  Lab 12/28/21 0314 12/28/21 1007 12/29/21 0319 12/30/21 0341  NA 135 131* 133* 135  K 3.9 3.7 4.2 3.6  CL 97* 95* 99 96*  CO2 25 24 20* 24  GLUCOSE 133* 145* 129* 151*  BUN 53* 55* 60* 39*  CREATININE 8.42* 8.81* 10.00* 8.25*  CALCIUM 7.7* 7.3* 6.9* 7.3*  PHOS 4.9* 4.4  --   --    Liver Function Tests: Recent Labs  Lab 12/28/21 0314 12/28/21 1007 12/29/21 0319 12/30/21 0341  AST 33  --  24 26  ALT 26  --  21 22  ALKPHOS 50  --  42 52  BILITOT 0.5  --  0.6 0.6  PROT 7.6  --  6.8 6.8  ALBUMIN 3.6 3.7 3.1* 3.1*   Recent Labs  Lab 12/26/21 0012 12/26/21 1445  LIPASE 69* 47   CBC: Recent Labs  Lab 12/26/21 1534 12/28/21 0314 12/28/21 1007 12/28/21 1312 12/28/21 1652 12/29/21 0319 12/30/21 0341  WBC 12.5* 11.4*  --  8.5  --  7.5 5.6  NEUTROABS  10.1* 8.3*  --   --   --  5.0 3.7  HGB 12.5 8.8*   < > 9.7* 8.3* 8.5* 8.8*  HCT 39.3 27.7*   < > 29.7* 25.4* 25.2* 25.5*  MCV 102.3* 101.8*  --  101.4*  --  102.4* 102.8*  PLT 244 172  --  179  --  149* 154   < > = values in this interval not displayed.   Blood Culture    Component Value Date/Time   SDES  06/11/2021 2053    BLOOD BLOOD RIGHT FOREARM Performed at [facility], [address], [place], [place] [address]    [facility]  06/11/2021 2053    Blood Culture adequate volume BOTTLES DRAWN AEROBIC AND ANAEROBIC Performed at [facility], [address] [place], [place] [address]    CULT  06/11/2021 2053    NO GROWTH 5 DAYS Performed at [facility] [address] [place],  [address]    REPTSTATUS 06/17/2021 FINAL 06/11/2021 2053    CBG: Recent Labs  Lab 12/29/21 1546 12/29/21 1733 12/29/21 2154 12/30/21 0443 12/30/21 6812  GLUCAP 93 119* 130* 168* 77    Medications:  anticoagulant sodium citrate     ciprofloxacin 400 mg (12/29/21 1739)   metronidazole 500 mg (12/29/21 2159)    calcium carbonate (dosed in mg elemental calcium)  500 mg of elemental calcium Oral Daily   Chlorhexidine Gluconate Cloth  6 each Topical Q0600   darbepoetin (ARANESP) injection - DIALYSIS  150 mcg Intravenous Q Tue-HD   feeding supplement  1 Container Oral TID BM   insulin aspart  0-9 Units Subcutaneous Q4H   insulin glargine-yfgn  5 Units Subcutaneous QHS   metoprolol tartrate  25 mg Oral BID   molnupiravir EUA  4 capsule Oral BID   pantoprazole  40 mg Intravenous Q12H   sevelamer carbonate  2.4 g Oral TID with meals   simvastatin  20 mg Oral QHS    Dialysis Orders: SW TTS 4hrs, 300/500 81kg 2K 3Ca  16G LU AVG Heparin 4000 bolus, 2500 intermittent  Mircera 35mcg q2wks - last 10/3 Hectorol 66mcg qHD   Assessment/Plan: GI bleeding - No acute findings on Flex Sig.  EGD w/esophagitis w/o bleeding, medium sized hiatal  hernia, benign esophageal stenosis and erythematous mucosa in gastric fundus. Holding heparin with HD. To continue pantoprazole and ondansetron.  Patient reports BRBPR this AM, early AM Hgb stable, plan to repeat this afternoon.    2. Possible infectious colitis - noted on CT. On Cipro and Flagyl.    3. ESRD - on HD TTS.  HD tomorrow per regular schedule. Hold Heparin.    4. COVID-19 infection - Asymptomatic. On molnupiravir. Pain control per PMD.    5. Liver Lesions - noted on abd CT.  Will need OP MRI.   6. Hypertension/Volume - on lopressor at home.  Does not appear volume overloaded on exam. CXR & CT chest w/atelectasis vs consolidation, possible L pleural effusion. Per weights getting under edw, possible weight loss, continue UF as tolerated.  Get standing weight.    7. Anemia of CKD - Hgb drop initially but has stabilized in mid 8s.  Patient reports BRBPR this AM, early AM Hgb 8.8, plan to repeat this afternoon. Increase dose aranesp 171mcg with HD yesterday.    8. Secondary Hyperparathyroidism - Ca 7.3, last phos in goal. Continue Renvela powder, Ca carbonate and hectorol.   9. Nutrition - Currently on soft diet. Renal diet w/fluid restrictions when advanced. Alb 3.1 - Boost ordered.    10. Uncontrolled DMT1 - insulin regimen per primary team  Jen Mow, PA-C [facility] 12/30/2021,10:45 AM  LOS: 2 days

## 2021-12-30 NOTE — Progress Notes (Signed)
PROGRESS NOTE    [name]  WER:[id number] DOB: [birth year] DOA: 12/26/2021 PCP: [name], MD   Brief Narrative:  [name] is a 36 y.o. female with medical history significant of ESRD, DM, pseudoseizures, gastroparesis, esophagitis, failed kidney transplant admitted with hematemesis and melena.  Hemoccult positive. COVID positive as well but symptomatic regarding this.  Admitted to hospitalist, GI consulted, underwent EGD and sigmoidoscopy.  Detail below.  Question of DKA while in ED.  Had anion gap yesterday, given [place] insulin, anion gap resolved on BMP this morning at 0800.  Assessment & Plan:   Principal Problem:   GI bleeding Active Problems:   COVID-19 virus infection   ESRD on hemodialysis ([place])   Uncontrolled type 1 diabetes mellitus with hyperglycemia (HCC)   Essential hypertension, benign   Liver lesion   Nausea and vomiting   Hematemesis with nausea   Abdominal pain, epigastric   Abnormal CT scan, colon  Upper GI bleeding/hematemesis and melena/acute blood loss anemia/rectal bleeding: Patient presented with hematemesis and melena prior to admission.  Hemoglobin around 12 upon presentation, down to 8.8 today.  Underwent EGD as well as flexible sigmoidoscopy by Dr. Fuller Plan on 12/29/2021 and was found to have severe esophagitis with no bleeding, benign-appearing esophageal stenosis, medium sized hiatal hernia and erythematous gastric mucosa.  Unremarkable sigmoidoscopy.  However this morning she claims that she has had 2 bowel movements and both of them had frank blood.  Hemoglobin however has remained stable.  I will recheck this afternoon and tomorrow morning and transfuse if drops less than 7.  If evidence of drop in hemoglobin, will need to touch base with GI again as they have already signed off.  Continue Protonix twice daily for now.  Will advance to soft diet.   Possible infectious colitis: Although this could very well be as a consequence of COVID infection  however, continue ciprofloxacin and Flagyl.  She still complains of abdominal pain and has generalized tenderness.   COVID-19 virus infection: She is asymptomatic and not hypoxic.  Chest imaging shows atelectasis and not groundglass opacities.  Continue molnupiravir, no indication of dexamethasone.  Patient was encouraged to prone, out of bed to chair, to use incentive spirometry and flutter valve.  For some reason, she has been placed on oxygen, she is saturating 100%, she is not hypoxic.  Nurse has been advised to wean her to room air.  Lungs clear to auscultation.   Uncontrolled type 1 diabetes mellitus with hyperglycemia ([place]): Appears to be taking 20 units of Lantus at home.  Hemoglobin A1c 9.2.  Currently on Semglee 5 units and SSI and blood sugar very well controlled.   ESRD on hemodialysis [facility]): Nephrology on board.   Liver lesion on Multiple liver lesions. Needs follow up MRI as outpt.   Essential hypertension, benign: Only on metoprolol which we will continue.  Controlled at the moment.  DVT prophylaxis: SCDs Start: 12/27/21 2112   Code Status: Full Code  Family Communication:  None present at bedside.  Plan of care discussed with patient in length and he/she verbalized understanding and agreed with it.  Status is: Inpatient Remains inpatient appropriate because: Patient now complaining of rectal bleeding.  Needs more observation.  Estimated body mass index is 26.9 kg/m as calculated from the following:   Height as of this encounter: 5\' 6"  (1.676 m).   Weight as of this encounter: 75.6 kg.    Nutritional Assessment: Body mass index is 26.9 kg/m.Marland Kitchen Seen by dietician.  I  agree with the assessment and plan as outlined below: Nutrition Status: Nutrition Problem: Inadequate oral intake Etiology: dysphagia, decreased appetite Signs/Symptoms: per patient/family report Interventions: Refer to RD note for recommendations  . Skin Assessment: I have examined the patient's skin  and I agree with the wound assessment as performed by the wound care RN as outlined below:    Consultants:  GI nephrology  Procedures:  None  Antimicrobials:  Anti-infectives (From admission, onward)    Start     Dose/Rate Route Frequency Ordered Stop   12/28/21 1800  ciprofloxacin (CIPRO) IVPB 400 mg        400 mg 200 mL/hr over 60 Minutes Intravenous Every 24 hours 12/28/21 1022     12/28/21 1115  metroNIDAZOLE (FLAGYL) IVPB 500 mg        500 mg 100 mL/hr over 60 Minutes Intravenous Every 12 hours 12/28/21 1022     12/27/21 2215  molnupiravir EUA (LAGEVRIO) capsule 800 mg        4 capsule Oral 2 times daily 12/27/21 2117 01/01/22 2159         Subjective:  Patient seen and examined.  Complains of abdominal pain and rectal bleeding x2 this morning.  No other complaint.  No shortness of breath.  Objective: Vitals:   12/29/21 2000 12/29/21 2034 12/29/21 2222 12/30/21 0453  BP: 117/89 137/78 (!) 164/90 (!) 154/82  Pulse:   81 83  Resp:   15 14  Temp:  98 F (36.7 C) 98.2 F (36.8 C) 98.3 F (36.8 C)  TempSrc:  Axillary Oral Oral  SpO2:   95% 100%  Weight:  75.6 kg    Height:        Intake/Output Summary (Last 24 hours) at 12/30/2021 0958 Last data filed at 12/29/2021 2034 Gross per 24 hour  Intake 0 ml  Output 3000 ml  Net -3000 ml    Filed Weights   12/29/21 1528 12/29/21 1755 12/29/21 2034  Weight: 78 kg 78 kg 75.6 kg    Examination:  General exam: Appears calm and comfortable  Respiratory system: Clear to auscultation. Respiratory effort normal. Cardiovascular system: S1 & S2 heard, RRR. No JVD, murmurs, rubs, gallops or clicks. No pedal edema. Gastrointestinal system: Abdomen is nondistended, soft and generalized tenderness, more pronounced in periumbilical, epigastric and right upper quadrant area. No organomegaly or masses felt. Normal bowel sounds heard. Central nervous system: Alert and oriented. No focal neurological deficits. Extremities:  Symmetric 5 x 5 power. Skin: No rashes, lesions or ulcers.  Psychiatry: Judgement and insight appear normal. Mood & affect appropriate.   Data Reviewed: I have personally reviewed following labs and imaging studies  CBC: Recent Labs  Lab 12/26/21 0012 12/26/21 1534 12/28/21 0314 12/28/21 1007 12/28/21 1312 12/28/21 1652 12/29/21 0319 12/30/21 0341  WBC 11.4* 12.5* 11.4*  --  8.5  --  7.5 5.6  NEUTROABS 9.8* 10.1* 8.3*  --   --   --  5.0 3.7  HGB 12.4 12.5 8.8* 9.0* 9.7* 8.3* 8.5* 8.8*  HCT 36.9 39.3 27.7* 27.9* 29.7* 25.4* 25.2* 25.5*  MCV 99.5 102.3* 101.8*  --  101.4*  --  102.4* 102.8*  PLT 196 244 172  --  179  --  149* 734    Basic Metabolic Panel: Recent Labs  Lab 12/27/21 0813 12/28/21 0314 12/28/21 1007 12/29/21 0319 12/30/21 0341  NA 139 135 131* 133* 135  K 4.5 3.9 3.7 4.2 3.6  CL 99 97* 95* 99 96*  CO2 29 25  24 20* 24  GLUCOSE 208* 133* 145* 129* 151*  BUN 39* 53* 55* 60* 39*  CREATININE 6.53* 8.42* 8.81* 10.00* 8.25*  CALCIUM 8.8* 7.7* 7.3* 6.9* 7.3*  MG  --  1.9  --   --   --   PHOS  --  4.9* 4.4  --   --     GFR: Estimated Creatinine Clearance: 9.6 mL/min (A) (by C-G formula based on SCr of 8.25 mg/dL (H)). Liver Function Tests: Recent Labs  Lab 12/26/21 0012 12/26/21 1445 12/28/21 0314 12/28/21 1007 12/29/21 0319 12/30/21 0341  AST 22 17 33  --  24 26  ALT 16 10 26   --  21 22  ALKPHOS 65 55 50  --  42 52  BILITOT 0.9 0.7 0.5  --  0.6 0.6  PROT 9.2* 9.7* 7.6  --  6.8 6.8  ALBUMIN 4.3 4.7 3.6 3.7 3.1* 3.1*    Recent Labs  Lab 12/26/21 0012 12/26/21 1445  LIPASE 69* 47    No results for input(s): "AMMONIA" in the last 168 hours. Coagulation Profile: Recent Labs  Lab 12/26/21 1534  INR 1.2    Cardiac Enzymes: No results for input(s): "CKTOTAL", "CKMB", "CKMBINDEX", "TROPONINI" in the last 168 hours. BNP (last 3 results) No results for input(s): "PROBNP" in the last 8760 hours. HbA1C: Recent Labs    12/28/21 0313  HGBA1C  9.2*    CBG: Recent Labs  Lab 12/29/21 1546 12/29/21 1733 12/29/21 2154 12/30/21 0443 12/30/21 0847  GLUCAP 93 119* 130* 168* 77    Lipid Profile: No results for input(s): "CHOL", "HDL", "LDLCALC", "TRIG", "CHOLHDL", "LDLDIRECT" in the last 72 hours. Thyroid Function Tests: No results for input(s): "TSH", "T4TOTAL", "FREET4", "T3FREE", "THYROIDAB" in the last 72 hours. Anemia Panel: No results for input(s): "VITAMINB12", "FOLATE", "FERRITIN", "TIBC", "IRON", "RETICCTPCT" in the last 72 hours. Sepsis Labs: Recent Labs  Lab 12/28/21 0313  PROCALCITON 3.24     Recent Results (from the past 240 hour(s))  Resp Panel by RT-PCR (Flu A&B, Covid) Anterior Nasal Swab     Status: Abnormal   Collection Time: 12/27/21 11:18 AM   Specimen: Anterior Nasal Swab  Result Value Ref Range Status   SARS Coronavirus 2 by RT PCR POSITIVE (A) NEGATIVE Final    Comment: (NOTE) SARS-CoV-2 target nucleic acids are DETECTED.  The SARS-CoV-2 RNA is generally detectable in upper respiratory specimens during the acute phase of infection. Positive results are indicative of the presence of the identified virus, but do not rule out bacterial infection or co-infection with other pathogens not detected by the test. Clinical correlation with patient history and other diagnostic information is necessary to determine patient infection status. The expected result is Negative.  Fact Sheet for Patients: EntrepreneurPulse.com.au  Fact Sheet for Healthcare Providers: IncredibleEmployment.be  This test is not yet approved or cleared by the [place] FDA and  has been authorized for detection and/or diagnosis of SARS-CoV-2 by FDA under an Emergency Use Authorization (EUA).  This EUA will remain in effect (meaning this test can be used) for the duration of  the COVID-19 declaration under Section 564(b)(1) of the A ct, 21 U.S.C. section 360bbb-3(b)(1), unless the  authorization is terminated or revoked sooner.     Influenza A by PCR NEGATIVE NEGATIVE Final   Influenza B by PCR NEGATIVE NEGATIVE Final    Comment: (NOTE) The Xpert Xpress SARS-CoV-2/FLU/RSV plus assay is intended as an aid in the diagnosis of influenza from Nasopharyngeal swab specimens and should not  be used as a sole basis for treatment. Nasal washings and aspirates are unacceptable for Xpert Xpress SARS-CoV-2/FLU/RSV testing.  Fact Sheet for Patients: EntrepreneurPulse.com.au  Fact Sheet for Healthcare Providers: IncredibleEmployment.be  This test is not yet approved or cleared by the [place] FDA and has been authorized for detection and/or diagnosis of SARS-CoV-2 by FDA under an Emergency Use Authorization (EUA). This EUA will remain in effect (meaning this test can be used) for the duration of the COVID-19 declaration under Section 564(b)(1) of the Act, 21 U.S.C. section 360bbb-3(b)(1), unless the authorization is terminated or revoked.  Performed at [facility], [address], [place], [place] [address]      Radiology Studies: No results found.  Scheduled Meds:  calcium carbonate (dosed in mg elemental calcium)  500 mg of elemental calcium Oral Daily   Chlorhexidine Gluconate Cloth  6 each Topical Q0600   darbepoetin (ARANESP) injection - DIALYSIS  150 mcg Intravenous Q Tue-HD   feeding supplement  1 Container Oral TID BM   insulin aspart  0-9 Units Subcutaneous Q4H   insulin glargine-yfgn  5 Units Subcutaneous QHS   metoprolol tartrate  25 mg Oral BID   molnupiravir EUA  4 capsule Oral BID   pantoprazole  40 mg Intravenous Q12H   sevelamer carbonate  2.4 g Oral TID with meals   simvastatin  20 mg Oral QHS   Continuous Infusions:  anticoagulant sodium citrate     ciprofloxacin 400 mg (12/29/21 1739)   metronidazole 500 mg (12/29/21 2159)     LOS: 2 days   Valerie Cheney, MD [facility]  12/30/2021, 9:58 AM   *Please note that this is a verbal dictation therefore any spelling or grammatical errors are due to the "[place]" system interpretation.  Please page via [place] and do not message via secure chat for urgent patient care matters. Secure chat can be used for non urgent patient care matters.  How to contact the [facility] Attending or Consulting provider [place] or covering provider during after hours [place], for this patient?  Check the care team in [facility] and look for a) attending/consulting TRH provider listed and b) the [facility] team listed. Page or secure chat [phone]. Log into [email or web address] and use [place]'s universal password to access. If you do not have the password, please contact the hospital operator. Locate the [facility] provider you are looking for under [facility] and page to a number that you can be directly reached. If you still have difficulty reaching the provider, please page the [facility] (Director on Call) for the Hospitalists listed on amion for assistance.

## 2021-12-30 NOTE — Inpatient Diabetes Management (Signed)
Inpatient Diabetes Program Recommendations  AACE/ADA: New Consensus Statement on Inpatient Glycemic Control (2015)  Target Ranges:  Prepandial:   less than 140 mg/dL      Peak postprandial:   less than 180 mg/dL (1-2 hours)      Critically ill patients:  140 - 180 mg/dL   Lab Results  Component Value Date   GLUCAP 77 12/30/2021   HGBA1C 9.2 (H) 12/28/2021    Review of Glycemic Control  Latest Reference Range & Units 12/29/21 07:27 12/29/21 11:49 12/29/21 15:46 12/29/21 17:33 12/29/21 21:54 12/30/21 04:43 12/30/21 08:47  Glucose-Capillary 70 - 99 mg/dL 130 (H) 87 93 119 (H) 130 (H) 168 (H) 77  (H): Data is abnormally high  Diabetes history: T1DM Outpatient Diabetes medications: Tresiba 5-10 units QD, Novolog 0-15 units TID Current orders for Inpatient glycemic control: Semglee 5 units QD, Novolog 0-9 units Q4H  Inpatient Diabetes Program Recommendations:    Novolog 0-9 units TID and 0-5 units QHS  Will continue to follow while inpatient.  Thank you, Reche Dixon, MSN, [place] Diabetes Coordinator Inpatient Diabetes Program [phone] (team pager from 8a-5p)

## 2021-12-31 ENCOUNTER — Encounter (HOSPITAL_COMMUNITY): Payer: Self-pay | Admitting: Family Medicine

## 2021-12-31 DIAGNOSIS — K920 Hematemesis: Secondary | ICD-10-CM | POA: Diagnosis not present

## 2021-12-31 DIAGNOSIS — N186 End stage renal disease: Secondary | ICD-10-CM | POA: Diagnosis not present

## 2021-12-31 DIAGNOSIS — Z992 Dependence on renal dialysis: Secondary | ICD-10-CM | POA: Diagnosis not present

## 2021-12-31 LAB — COMPREHENSIVE METABOLIC PANEL
ALT: 18 U/L (ref 0–44)
AST: 20 U/L (ref 15–41)
Albumin: 3.2 g/dL — ABNORMAL LOW (ref 3.5–5.0)
Alkaline Phosphatase: 52 U/L (ref 38–126)
Anion gap: 17 — ABNORMAL HIGH (ref 5–15)
BUN: 46 mg/dL — ABNORMAL HIGH (ref 6–20)
CO2: 21 mmol/L — ABNORMAL LOW (ref 22–32)
Calcium: 6.4 mg/dL — CL (ref 8.9–10.3)
Chloride: 97 mmol/L — ABNORMAL LOW (ref 98–111)
Creatinine, Ser: 10.46 mg/dL — ABNORMAL HIGH (ref 0.44–1.00)
GFR, Estimated: 4 mL/min — ABNORMAL LOW (ref >60)
Glucose, Bld: 186 mg/dL — ABNORMAL HIGH (ref 70–99)
Potassium: 3.3 mmol/L — ABNORMAL LOW (ref 3.5–5.1)
Sodium: 135 mmol/L (ref 135–145)
Total Bilirubin: 0.3 mg/dL (ref 0.3–1.2)
Total Protein: 6.7 g/dL (ref 6.5–8.1)

## 2021-12-31 LAB — GLUCOSE, CAPILLARY
Glucose-Capillary: 139 mg/dL — ABNORMAL HIGH (ref 70–99)
Glucose-Capillary: 117 mg/dL — ABNORMAL HIGH (ref 70–99)
Glucose-Capillary: 226 mg/dL — ABNORMAL HIGH (ref 70–99)
Glucose-Capillary: 137 mg/dL — ABNORMAL HIGH (ref 70–99)
Glucose-Capillary: 122 mg/dL — ABNORMAL HIGH (ref 70–99)
Glucose-Capillary: 164 mg/dL — ABNORMAL HIGH (ref 70–99)
Glucose-Capillary: 231 mg/dL — ABNORMAL HIGH (ref 70–99)

## 2021-12-31 LAB — CBC WITH DIFFERENTIAL/PLATELET
Abs Immature Granulocytes: 0.06 10*3/uL (ref 0.00–0.07)
Basophils Absolute: 0 10*3/uL (ref 0.0–0.1)
Basophils Relative: 0 %
Eosinophils Absolute: 0.3 10*3/uL (ref 0.0–0.5)
Eosinophils Relative: 4 %
HCT: 25.2 % — ABNORMAL LOW (ref 36.0–46.0)
Hemoglobin: 8.9 g/dL — ABNORMAL LOW (ref 12.0–15.0)
Immature Granulocytes: 1 %
Lymphocytes Relative: 20 %
Lymphs Abs: 1.4 10*3/uL (ref 0.7–4.0)
MCH: 35.2 pg — ABNORMAL HIGH (ref 26.0–34.0)
MCHC: 35.3 g/dL (ref 30.0–36.0)
MCV: 99.6 fL (ref 80.0–100.0)
Monocytes Absolute: 0.5 10*3/uL (ref 0.1–1.0)
Monocytes Relative: 7 %
Neutro Abs: 4.5 10*3/uL (ref 1.7–7.7)
Neutrophils Relative %: 68 %
Platelets: 169 10*3/uL (ref 150–400)
RBC: 2.53 MIL/uL — ABNORMAL LOW (ref 3.87–5.11)
RDW: 14.9 % (ref 11.5–15.5)
WBC: 6.7 10*3/uL (ref 4.0–10.5)
nRBC: 0 % (ref 0.0–0.2)

## 2021-12-31 LAB — C-REACTIVE PROTEIN: CRP: 1.4 mg/dL — ABNORMAL HIGH (ref <1.0)

## 2021-12-31 LAB — SURGICAL PATHOLOGY

## 2021-12-31 MED ORDER — CALCIUM CARBONATE ANTACID 1250 MG/5ML PO SUSP
1000.0000 mg | Freq: Every day | ORAL | Status: DC
  Administered 2021-12-31 – 2022-01-01 (×2): 1000 mg via ORAL
  Filled 2021-12-31 (×2): qty 10

## 2021-12-31 MED ORDER — HYDROMORPHONE HCL 1 MG/ML IJ SOLN
1.0000 mg | INTRAMUSCULAR | Status: DC | PRN
  Administered 2021-12-31 (×3): 1 mg via INTRAVENOUS
  Filled 2021-12-31 (×3): qty 1

## 2021-12-31 MED ORDER — OXYCODONE HCL 5 MG PO TABS
5.0000 mg | ORAL_TABLET | Freq: Three times a day (TID) | ORAL | Status: AC | PRN
  Administered 2021-12-31 – 2022-01-01 (×2): 5 mg via ORAL
  Filled 2021-12-31 (×3): qty 1

## 2021-12-31 MED ORDER — LIDOCAINE-PRILOCAINE 2.5-2.5 % EX CREA: 1.0000 | TOPICAL_CREAM | CUTANEOUS | Status: DC | PRN

## 2021-12-31 MED ORDER — PENTAFLUOROPROP-TETRAFLUOROETH EX AERO: 1.0000 | INHALATION_SPRAY | CUTANEOUS | Status: DC | PRN

## 2021-12-31 MED ORDER — LIDOCAINE HCL (PF) 1 % IJ SOLN: 5.0000 mL | INTRAMUSCULAR | Status: DC | PRN

## 2021-12-31 NOTE — Progress Notes (Addendum)
HD delays: This rn arrived to bedside to perform HD procedure. Pt performed hygiene care per her desire prior to procedure. Primary md and floor nurse arrived to bedside to perform ekg d/t pt reports of chest pain. At time of cannulation, pt request 16 g needle for cannulation, called KHU unit for 16 g needles to be sent to 5N, will await 16 g needles for cannulation.Pt required recannulation d/t elevated venous pressure alarms

## 2021-12-31 NOTE — Plan of Care (Signed)
  Problem: Education: Goal: Knowledge of General Education information will improve Description: Including pain rating scale, medication(s)/side effects and non-pharmacologic comfort measures Outcome: Progressing   Problem: Health Behavior/Discharge Planning: Goal: Ability to manage health-related needs will improve Outcome: Progressing   Problem: Clinical Measurements: Goal: Ability to maintain clinical measurements within normal limits will improve Outcome: Progressing Goal: Will remain free from infection Outcome: Progressing Goal: Diagnostic test results will improve Outcome: Progressing Goal: Respiratory complications will improve Outcome: Progressing Goal: Cardiovascular complication will be avoided Outcome: Progressing   Problem: Activity: Goal: Risk for activity intolerance will decrease Outcome: Progressing   Problem: Nutrition: Goal: Adequate nutrition will be maintained Outcome: Progressing   Problem: Coping: Goal: Level of anxiety will decrease Outcome: Progressing   Problem: Elimination: Goal: Will not experience complications related to bowel motility Outcome: Progressing Goal: Will not experience complications related to urinary retention Outcome: Progressing   Problem: Pain Managment: Goal: General experience of comfort will improve Outcome: Progressing   Problem: Safety: Goal: Ability to remain free from injury will improve Outcome: Progressing   Problem: Skin Integrity: Goal: Risk for impaired skin integrity will decrease Outcome: Progressing   Problem: Education: Goal: Ability to describe self-care measures that may prevent or decrease complications (Diabetes Survival Skills Education) will improve Outcome: Progressing Goal: Individualized Educational Video(s) Outcome: Progressing   Problem: Coping: Goal: Ability to adjust to condition or change in health will improve Outcome: Progressing   Problem: Fluid Volume: Goal: Ability to  maintain a balanced intake and output will improve Outcome: Progressing   Problem: Health Behavior/Discharge Planning: Goal: Ability to identify and utilize available resources and services will improve Outcome: Progressing Goal: Ability to manage health-related needs will improve Outcome: Progressing   Problem: Metabolic: Goal: Ability to maintain appropriate glucose levels will improve Outcome: Progressing   Problem: Nutritional: Goal: Maintenance of adequate nutrition will improve Outcome: Progressing Goal: Progress toward achieving an optimal weight will improve Outcome: Progressing   Problem: Skin Integrity: Goal: Risk for impaired skin integrity will decrease Outcome: Progressing   Problem: Tissue Perfusion: Goal: Adequacy of tissue perfusion will improve Outcome: Progressing   Problem: Education: Goal: Knowledge of risk factors and measures for prevention of condition will improve Outcome: Progressing   Problem: Coping: Goal: Psychosocial and spiritual needs will be supported Outcome: Progressing   Problem: Respiratory: Goal: Will maintain a patent airway Outcome: Progressing Goal: Complications related to the disease process, condition or treatment will be avoided or minimized Outcome: Progressing   

## 2021-12-31 NOTE — Procedures (Signed)
Patient was seen on dialysis and the procedure was supervised.  BFR 350  Via AVG BP is  146/113.   Patient appears to be tolerating treatment well  Louis Meckel 12/31/2021

## 2021-12-31 NOTE — Progress Notes (Signed)
Pt receives out-pt HD at [facility] on TTS. Pt arrives at 6:10 am for 6:30 am chair time. Will assist as needed.   Melven Sartorius Renal Navigator [phone]

## 2021-12-31 NOTE — Progress Notes (Signed)
[facility] Progress Note   Subjective:   Patient seen and examined at bedside.  Chest pain again this morning, EKG reassuring.  Denies SOB or edema, and n/v/d.  Continues to have abdominal pain.  Getting ready to start dialysis.   Objective Vitals:   12/30/21 2053 12/31/21 0352 12/31/21 0825 12/31/21 0905  BP: 136/87 (!) 170/94 (!) 145/99   Pulse: 83 81 82   Resp: 20 19 16    Temp: 98 F (36.7 C) 98 F (36.7 C)    TempSrc: Oral Oral    SpO2: 95% 100% 96%   Weight:    (P) 79.7 kg  Height:       Physical Exam General:WDWN female in NAD Heart:RRR, no mrg Lungs:CTAB, nml WOB on RA Abdomen:soft, ND Extremities:no LE edema Dialysis Access: LU AVF +b/t   Filed Weights   12/29/21 1755 12/29/21 2034 12/31/21 0905  Weight: 78 kg 75.6 kg (P) 79.7 kg   No intake or output data in the 24 hours ending 12/31/21 0955  Additional Objective Labs: Basic Metabolic Panel: Recent Labs  Lab 12/28/21 0314 12/28/21 1007 12/29/21 0319 12/30/21 0341 12/31/21 0640  NA 135 131* 133* 135 135  K 3.9 3.7 4.2 3.6 3.3*  CL 97* 95* 99 96* 97*  CO2 25 24 20* 24 21*  GLUCOSE 133* 145* 129* 151* 186*  BUN 53* 55* 60* 39* 46*  CREATININE 8.42* 8.81* 10.00* 8.25* 10.46*  CALCIUM 7.7* 7.3* 6.9* 7.3* 6.4*  PHOS 4.9* 4.4  --   --   --    Liver Function Tests: Recent Labs  Lab 12/29/21 0319 12/30/21 0341 12/31/21 0640  AST 24 26 20   ALT 21 22 18   ALKPHOS 42 52 52  BILITOT 0.6 0.6 0.3  PROT 6.8 6.8 6.7  ALBUMIN 3.1* 3.1* 3.2*   Recent Labs  Lab 12/26/21 0012 12/26/21 1445  LIPASE 69* 47   CBC: Recent Labs  Lab 12/28/21 0314 12/28/21 1007 12/28/21 1312 12/28/21 1652 12/29/21 0319 12/30/21 0341 12/31/21 0640  WBC 11.4*  --  8.5  --  7.5 5.6 6.7  NEUTROABS 8.3*  --   --   --  5.0 3.7 4.5  HGB 8.8*   < > 9.7*   < > 8.5* 8.8* 8.9*  HCT 27.7*   < > 29.7*   < > 25.2* 25.5* 25.2*  MCV 101.8*  --  101.4*  --  102.4* 102.8* 99.6  PLT 172  --  179  --  149* 154 169    < > = values in this interval not displayed.   CBG: Recent Labs  Lab 12/30/21 1603 12/30/21 2051 12/30/21 2327 12/31/21 0338 12/31/21 0820  GLUCAP 139* 237* 162* 117* 226*   Medications:  ciprofloxacin 400 mg (12/30/21 1743)   metronidazole 500 mg (12/30/21 2058)    calcium carbonate (dosed in mg elemental calcium)  1,000 mg of elemental calcium Oral Daily   Chlorhexidine Gluconate Cloth  6 each Topical Q0600   darbepoetin (ARANESP) injection - DIALYSIS  150 mcg Intravenous Q Tue-HD   feeding supplement  1 Container Oral TID BM   insulin aspart  0-9 Units Subcutaneous Q4H   insulin glargine-yfgn  5 Units Subcutaneous QHS   metoprolol tartrate  25 mg Oral BID   molnupiravir EUA  4 capsule Oral BID   pantoprazole  40 mg Intravenous Q12H   sevelamer carbonate  2.4 g Oral TID with meals   simvastatin  20 mg Oral QHS    Dialysis  Orders: SW TTS 4hrs, 300/500 81kg 2K 3Ca  16G LU AVG Heparin 4000 bolus, 2500 intermittent  Mircera 73mcg q2wks - last 10/3 Hectorol 31mcg qHD   Assessment/Plan: GI bleeding - No acute findings on Flex Sig.  EGD w/esophagitis w/o bleeding, medium sized hiatal hernia, benign esophageal stenosis and erythematous mucosa in gastric fundus. Holding heparin with HD. To continue pantoprazole and ondansetron.  Hgb increasing.    2. Possible infectious colitis - noted on CT. On Cipro and Flagyl.    3. ESRD - on HD TTS.  HD today per regular schedule. Hold Heparin.   4. COVID-19 infection - Asymptomatic. On molnupiravir. Pain control per PMD.    5. Liver Lesions - noted on abd CT.  Will need OP MRI.   6. Hypertension/Volume - on lopressor at home.  Does not appear volume overloaded on exam. CXR & CT chest w/atelectasis vs consolidation, possible L pleural effusion. Per weights getting under edw, possible weight loss, continue UF as tolerated.  Get standing weight post HD today.    7. Anemia of CKD - Hgb drop initially but has stabilized in mid 8s, 8.9 this  AM. Increase dose aranesp 162mcg with HD yesterday.    8. Secondary Hyperparathyroidism - Ca drop 6.4, increase Ca carbonate to 1000mg  qd.  last phos in goal. Continue Renvela powder and hectorol.   9. Nutrition - Currently on soft diet. Renal diet w/fluid restrictions when advanced. Alb 3.1 - Boost ordered.    10. Uncontrolled DMT1 - insulin regimen per primary team  Jen Mow, PA-C [facility] 12/31/2021,9:55 AM  LOS: 3 days

## 2021-12-31 NOTE — Progress Notes (Addendum)
Progress Note    [name]  PIR:[id number] DOB: [birth year]  DOA: 12/26/2021 PCP: [name], MD      Brief Narrative:    Medical records reviewed and are as summarized below:  [name] is a 36 y.o. female with medical history significant of ESRD, DM, hypertension, pseudoseizures, gastroparesis, esophagitis, failed kidney transplant admitted with hematemesis and melena.  Hemoccult positive. COVID positive .  She was treated with IV Protonix.  She underwent EGD and colonoscopy which showed grade D esophagitis.        Assessment/Plan:   Principal Problem:   GI bleeding Active Problems:   COVID-19 virus infection   ESRD on hemodialysis ([facility])   Uncontrolled type 1 diabetes mellitus with hyperglycemia (HCC)   Essential hypertension, benign   Liver lesion   Nausea and vomiting   Hematemesis with nausea   Abdominal pain, epigastric   Abnormal CT scan, colon   Nutrition Problem: Inadequate oral intake Etiology: dysphagia, decreased appetite  Signs/Symptoms: per patient/family report   Body mass index is 28.36 kg/m.   Upper GI bleeding/hematemesis and melena: S/p EGD and colonoscopy which showed grade D esophagitis.  Continue IV Protonix.  Possible infectious colitis/diarrhea: Probably from COVID-19 infection. Continue IV ciprofloxacin and Flagyl for another day.  Chest pain: EKG showed normal sinus rhythm.  No shortness of breath, no tachypnea, no tachycardia or hypoxia.  This may be due to reflux esophagitis.  Use analgesics as needed.  She required Dilaudid for pain.   Hypocalcemia: Defer treatment to nephrologist.  Discussed with Ria Comment, nephrology physician assistant at the bedside.  She is on calcium carbonate.  COVID-19 infection: Continue molnupiravir.  ESRD on hemodialysis: She is having hemodialysis today.  Multiple liver lesions: Outpatient follow-up with MRI recommended.  Other comorbidities include hypertension, insulin-dependent  diabetes mellitus    Diet Order             DIET DYS 3 Room service appropriate? Yes; Fluid consistency: Thin  Diet effective now                            Consultants: Gastroenterologist  Procedures: EGD and colonoscopy on 12/29/2021    Medications:    calcium carbonate (dosed in mg elemental calcium)  1,000 mg of elemental calcium Oral Daily   Chlorhexidine Gluconate Cloth  6 each Topical Q0600   darbepoetin (ARANESP) injection - DIALYSIS  150 mcg Intravenous Q Tue-HD   feeding supplement  1 Container Oral TID BM   insulin aspart  0-9 Units Subcutaneous Q4H   insulin glargine-yfgn  5 Units Subcutaneous QHS   metoprolol tartrate  25 mg Oral BID   molnupiravir EUA  4 capsule Oral BID   pantoprazole  40 mg Intravenous Q12H   sevelamer carbonate  2.4 g Oral TID with meals   simvastatin  20 mg Oral QHS   Continuous Infusions:  ciprofloxacin 400 mg (12/30/21 1743)   metronidazole 500 mg (12/31/21 1036)     Anti-infectives (From admission, onward)    Start     Dose/Rate Route Frequency Ordered Stop   12/28/21 1800  ciprofloxacin (CIPRO) IVPB 400 mg        400 mg 200 mL/hr over 60 Minutes Intravenous Every 24 hours 12/28/21 1022     12/28/21 1115  metroNIDAZOLE (FLAGYL) IVPB 500 mg        500 mg 100 mL/hr over 60 Minutes Intravenous Every 12 hours 12/28/21 1022  12/27/21 2215  molnupiravir EUA (LAGEVRIO) capsule 800 mg        4 capsule Oral 2 times daily 12/27/21 2117 01/01/22 2159              Family Communication/Anticipated D/C date and plan/Code Status   DVT prophylaxis: SCDs Start: 12/27/21 2112     Code Status: Full Code  Family Communication: None Disposition Plan: Plan to discharge home in 1 to 2 days   Status is: Inpatient Remains inpatient appropriate because: Chest pain       Subjective:   Interval events noted.  She complains of chest pain mainly in the lower midsternal area/epigastric area.  She thinks pain  moves around  the right upper chest area.  No shortness of breath, dizziness or cough.  No diarrhea today.  Elisa, RN, was at the bedside.  Ria Comment, nephrologist physician assistant was also at the bedside.  Objective:    Vitals:   12/31/21 1025 12/31/21 1100 12/31/21 1130 12/31/21 1200  BP: (!) 146/113 (!) 161/102 127/81 (!) 140/99  Pulse:   74   Resp: 16 16 17 15   Temp: 98.5 F (36.9 C)  98.1 F (36.7 C)   TempSrc: Oral  Oral   SpO2: 100% 100% 100% 100%  Weight:      Height:       No data found.  No intake or output data in the 24 hours ending 12/31/21 1315 Filed Weights   12/29/21 1755 12/29/21 2034 12/31/21 0905  Weight: 78 kg 75.6 kg 79.7 kg    Exam:  GEN: NAD SKIN: Warm and dry EYES: EOMI ENT: MMM CV: RRR PULM: CTA B ABD: soft, obese, NT, +BS CNS: AAO x 3, non focal EXT: No edema or tenderness        Data Reviewed:   I have personally reviewed following labs and imaging studies:  Labs: Labs show the following:   Basic Metabolic Panel: Recent Labs  Lab 12/28/21 0314 12/28/21 1007 12/29/21 0319 12/30/21 0341 12/31/21 0640  NA 135 131* 133* 135 135  K 3.9 3.7 4.2 3.6 3.3*  CL 97* 95* 99 96* 97*  CO2 25 24 20* 24 21*  GLUCOSE 133* 145* 129* 151* 186*  BUN 53* 55* 60* 39* 46*  CREATININE 8.42* 8.81* 10.00* 8.25* 10.46*  CALCIUM 7.7* 7.3* 6.9* 7.3* 6.4*  MG 1.9  --   --   --   --   PHOS 4.9* 4.4  --   --   --    GFR Estimated Creatinine Clearance: 7.8 mL/min (A) (by C-G formula based on SCr of 10.46 mg/dL (H)). Liver Function Tests: Recent Labs  Lab 12/26/21 1445 12/28/21 0314 12/28/21 1007 12/29/21 0319 12/30/21 0341 12/31/21 0640  AST 17 33  --  24 26 20   ALT 10 26  --  21 22 18   ALKPHOS 55 50  --  42 52 52  BILITOT 0.7 0.5  --  0.6 0.6 0.3  PROT 9.7* 7.6  --  6.8 6.8 6.7  ALBUMIN 4.7 3.6 3.7 3.1* 3.1* 3.2*   Recent Labs  Lab 12/26/21 0012 12/26/21 1445  LIPASE 69* 47   No results for input(s): "AMMONIA" in the last 168  hours. Coagulation profile Recent Labs  Lab 12/26/21 1534  INR 1.2    CBC: Recent Labs  Lab 12/26/21 1534 12/28/21 0314 12/28/21 1007 12/28/21 1312 12/28/21 1652 12/29/21 0319 12/30/21 0341 12/31/21 0640  WBC 12.5* 11.4*  --  8.5  --  7.5 5.6  6.7  NEUTROABS 10.1* 8.3*  --   --   --  5.0 3.7 4.5  HGB 12.5 8.8*   < > 9.7* 8.3* 8.5* 8.8* 8.9*  HCT 39.3 27.7*   < > 29.7* 25.4* 25.2* 25.5* 25.2*  MCV 102.3* 101.8*  --  101.4*  --  102.4* 102.8* 99.6  PLT 244 172  --  179  --  149* 154 169   < > = values in this interval not displayed.   Cardiac Enzymes: No results for input(s): "CKTOTAL", "CKMB", "CKMBINDEX", "TROPONINI" in the last 168 hours. BNP (last 3 results) No results for input(s): "PROBNP" in the last 8760 hours. CBG: Recent Labs  Lab 12/30/21 2051 12/30/21 2327 12/31/21 0338 12/31/21 0820 12/31/21 1138  GLUCAP 237* 162* 117* 226* 137*   D-Dimer: No results for input(s): "DDIMER" in the last 72 hours. Hgb A1c: No results for input(s): "HGBA1C" in the last 72 hours. Lipid Profile: No results for input(s): "CHOL", "HDL", "LDLCALC", "TRIG", "CHOLHDL", "LDLDIRECT" in the last 72 hours. Thyroid function studies: No results for input(s): "TSH", "T4TOTAL", "T3FREE", "THYROIDAB" in the last 72 hours.  Invalid input(s): "FREET3" Anemia work up: No results for input(s): "VITAMINB12", "FOLATE", "FERRITIN", "TIBC", "IRON", "RETICCTPCT" in the last 72 hours. Sepsis Labs: Recent Labs  Lab 12/28/21 0313 12/28/21 0314 12/28/21 1312 12/29/21 0319 12/30/21 0341 12/31/21 0640  PROCALCITON 3.24  --   --   --   --   --   WBC  --    < > 8.5 7.5 5.6 6.7   < > = values in this interval not displayed.    Microbiology Recent Results (from the past 240 hour(s))  Resp Panel by RT-PCR (Flu A&B, Covid) Anterior Nasal Swab     Status: Abnormal   Collection Time: 12/27/21 11:18 AM   Specimen: Anterior Nasal Swab  Result Value Ref Range Status   SARS Coronavirus 2 by RT PCR  POSITIVE (A) NEGATIVE Final    Comment: (NOTE) SARS-CoV-2 target nucleic acids are DETECTED.  The SARS-CoV-2 RNA is generally detectable in upper respiratory specimens during the acute phase of infection. Positive results are indicative of the presence of the identified virus, but do not rule out bacterial infection or co-infection with other pathogens not detected by the test. Clinical correlation with patient history and other diagnostic information is necessary to determine patient infection status. The expected result is Negative.  Fact Sheet for Patients: EntrepreneurPulse.com.au  Fact Sheet for Healthcare Providers: IncredibleEmployment.be  This test is not yet approved or cleared by the [place] FDA and  has been authorized for detection and/or diagnosis of SARS-CoV-2 by FDA under an Emergency Use Authorization (EUA).  This EUA will remain in effect (meaning this test can be used) for the duration of  the COVID-19 declaration under Section 564(b)(1) of the A ct, 21 U.S.C. section 360bbb-3(b)(1), unless the authorization is terminated or revoked sooner.     Influenza A by PCR NEGATIVE NEGATIVE Final   Influenza B by PCR NEGATIVE NEGATIVE Final    Comment: (NOTE) The Xpert Xpress SARS-CoV-2/FLU/RSV plus assay is intended as an aid in the diagnosis of influenza from Nasopharyngeal swab specimens and should not be used as a sole basis for treatment. Nasal washings and aspirates are unacceptable for Xpert Xpress SARS-CoV-2/FLU/RSV testing.  Fact Sheet for Patients: EntrepreneurPulse.com.au  Fact Sheet for Healthcare Providers: IncredibleEmployment.be  This test is not yet approved or cleared by the [place] FDA and has been authorized for detection and/or diagnosis  of SARS-CoV-2 by FDA under an Emergency Use Authorization (EUA). This EUA will remain in effect (meaning this test can be used)  for the duration of the COVID-19 declaration under Section 564(b)(1) of the Act, 21 U.S.C. section 360bbb-3(b)(1), unless the authorization is terminated or revoked.  Performed at [facility], [address], [place], [place] [address]     Procedures and diagnostic studies:  No results found.             LOS: 3 days   Kenyada Hy  [facility]   Pager on [email or web address]. If 7PM-7AM, please contact night-coverage at [email or web address]     12/31/2021, 1:15 PM

## 2021-12-31 NOTE — Anesthesia Postprocedure Evaluation (Signed)
Anesthesia Post Note  Patient: [name]  Procedure(s) Performed: ESOPHAGOGASTRODUODENOSCOPY (EGD) WITH PROPOFOL FLEXIBLE SIGMOIDOSCOPY BIOPSY     Patient location during evaluation: PACU Anesthesia Type: MAC Level of consciousness: patient cooperative and awake Pain management: pain level controlled Vital Signs Assessment: post-procedure vital signs reviewed and stable Respiratory status: spontaneous breathing, nonlabored ventilation, respiratory function stable and patient connected to nasal cannula oxygen Cardiovascular status: stable and blood pressure returned to baseline Postop Assessment: no apparent nausea or vomiting Anesthetic complications: no   No notable events documented.  Last Vitals:  Vitals:   12/31/21 0352 12/31/21 0825  BP: (!) 170/94 (!) 145/99  Pulse: 81 82  Resp: 19 16  Temp: 36.7 C   SpO2: 100% 96%    Last Pain:  Vitals:   12/31/21 0352  TempSrc: Oral  PainSc:                  [name]

## 2021-12-31 NOTE — Progress Notes (Addendum)
This rn arrived to 5N10 to perform HD procedure Alert and oriented.  Informed consent obtained.and placed in chart  Treatment initiated: 0956 Treatment completed: 1526  Patient tolerated well.  Machine with air detection alarm, unable to safely return pt blood, new HD set primed and tx restarted. Alert, without acute distress.  Hand-off given to patient's nurse.   Access used: graft left arm  Access issues: yes. Elevated venous alarms, BFR 250-300 as tolerated  Total UF removed: 1 liter Medication(s) given: none Post HD VS:  Post HD weight:    [facility]

## 2021-12-31 NOTE — Progress Notes (Signed)
Spoke w/ renal navigator to discuss if it was ok to go ahead and run this pt 1st rounds. Got the OK from her to go ahead to run HD tx 1st rounds

## 2022-01-01 LAB — COMPREHENSIVE METABOLIC PANEL
ALT: 17 U/L (ref 0–44)
AST: 20 U/L (ref 15–41)
Albumin: 3.1 g/dL — ABNORMAL LOW (ref 3.5–5.0)
Alkaline Phosphatase: 47 U/L (ref 38–126)
Anion gap: 12 (ref 5–15)
BUN: 14 mg/dL (ref 6–20)
CO2: 28 mmol/L (ref 22–32)
Calcium: 7.1 mg/dL — ABNORMAL LOW (ref 8.9–10.3)
Chloride: 95 mmol/L — ABNORMAL LOW (ref 98–111)
Creatinine, Ser: 5.69 mg/dL — ABNORMAL HIGH (ref 0.44–1.00)
GFR, Estimated: 9 mL/min — ABNORMAL LOW (ref >60)
Glucose, Bld: 142 mg/dL — ABNORMAL HIGH (ref 70–99)
Potassium: 3.2 mmol/L — ABNORMAL LOW (ref 3.5–5.1)
Sodium: 135 mmol/L (ref 135–145)
Total Bilirubin: 0.4 mg/dL (ref 0.3–1.2)
Total Protein: 6.7 g/dL (ref 6.5–8.1)

## 2022-01-01 LAB — CBC WITH DIFFERENTIAL/PLATELET
Abs Immature Granulocytes: 0.04 10*3/uL (ref 0.00–0.07)
Basophils Absolute: 0 10*3/uL (ref 0.0–0.1)
Basophils Relative: 1 %
Eosinophils Absolute: 0.2 10*3/uL (ref 0.0–0.5)
Eosinophils Relative: 4 %
HCT: 26.7 % — ABNORMAL LOW (ref 36.0–46.0)
Hemoglobin: 9 g/dL — ABNORMAL LOW (ref 12.0–15.0)
Immature Granulocytes: 1 %
Lymphocytes Relative: 20 %
Lymphs Abs: 1.3 10*3/uL (ref 0.7–4.0)
MCH: 34.1 pg — ABNORMAL HIGH (ref 26.0–34.0)
MCHC: 33.7 g/dL (ref 30.0–36.0)
MCV: 101.1 fL — ABNORMAL HIGH (ref 80.0–100.0)
Monocytes Absolute: 0.6 10*3/uL (ref 0.1–1.0)
Monocytes Relative: 9 %
Neutro Abs: 4.2 10*3/uL (ref 1.7–7.7)
Neutrophils Relative %: 65 %
Platelets: 160 10*3/uL (ref 150–400)
RBC: 2.64 MIL/uL — ABNORMAL LOW (ref 3.87–5.11)
RDW: 14.8 % (ref 11.5–15.5)
WBC: 6.5 10*3/uL (ref 4.0–10.5)
nRBC: 0 % (ref 0.0–0.2)

## 2022-01-01 LAB — GLUCOSE, CAPILLARY
Glucose-Capillary: 122 mg/dL — ABNORMAL HIGH (ref 70–99)
Glucose-Capillary: 143 mg/dL — ABNORMAL HIGH (ref 70–99)
Glucose-Capillary: 152 mg/dL — ABNORMAL HIGH (ref 70–99)

## 2022-01-01 LAB — C-REACTIVE PROTEIN: CRP: 1 mg/dL — ABNORMAL HIGH (ref <1.0)

## 2022-01-01 MED ORDER — METOPROLOL TARTRATE 25 MG PO TABS: 25.0000 mg | ORAL_TABLET | Freq: Every day | ORAL | Status: DC | PRN

## 2022-01-01 MED ORDER — OXYCODONE-ACETAMINOPHEN 5-325 MG PO TABS: 1.0000 | ORAL_TABLET | Freq: Every day | ORAL | 0 refills | Status: DC | PRN

## 2022-01-01 MED ORDER — SILVER SULFADIAZINE 1 % EX CREA: 1.0000 | TOPICAL_CREAM | Freq: Every day | CUTANEOUS | Status: DC | PRN

## 2022-01-01 MED ORDER — ONDANSETRON 8 MG PO TBDP: 8.0000 mg | ORAL_TABLET | Freq: Three times a day (TID) | ORAL | 0 refills | Status: DC | PRN

## 2022-01-01 MED ORDER — FAMOTIDINE 40 MG PO TABS: 40.0000 mg | ORAL_TABLET | Freq: Two times a day (BID) | ORAL | Status: DC | PRN

## 2022-01-01 MED ORDER — INSULIN DEGLUDEC 100 UNIT/ML ~~LOC~~ SOPN: 5.0000 [IU] | PEN_INJECTOR | Freq: Every day | SUBCUTANEOUS | Status: DC

## 2022-01-01 MED ORDER — POTASSIUM CHLORIDE 20 MEQ PO PACK
40.0000 meq | PACK | Freq: Once | ORAL | Status: AC
  Administered 2022-01-01: 40 meq via ORAL
  Filled 2022-01-01: qty 2

## 2022-01-01 NOTE — Progress Notes (Addendum)
[facility] Progress Note   Subjective:    Seen and examined patient at bedside. She reports occasional diarrhea. Denies SOB and CP. Tolerated yesterday's HD with net UF 1L. Next HD 10/21. Noted K+ is trending down-now 3.2, will order KCL 53meq X 1 today. Going home today !  Objective Vitals:   12/31/21 1526 12/31/21 1549 12/31/21 1900 01/01/22 0500  BP: (!) 152/105 (!) 147/89 (!) 147/95 130/72  Pulse:   92 80  Resp: 20 20 16    Temp: (!) 97.5 F (36.4 C)  98 F (36.7 C) 98.3 F (36.8 C)  TempSrc: Oral  Oral Oral  SpO2: 100% 100% 94% 100%  Weight:      Height:       Physical Exam General:WDWN female in NAD; on RA Heart:RRR, no mrg Lungs:CTAB, nml WOB on RA Abdomen:soft, ND Extremities:no LE edema Dialysis Access: LU AVF +b/t   Filed Weights   12/29/21 1755 12/29/21 2034 12/31/21 0905  Weight: 78 kg 75.6 kg 79.7 kg    Intake/Output Summary (Last 24 hours) at 01/01/2022 0837 Last data filed at 12/31/2021 1526 Gross per 24 hour  Intake 580 ml  Output 1000 ml  Net -420 ml    Additional Objective Labs: Basic Metabolic Panel: Recent Labs  Lab 12/28/21 0314 12/28/21 1007 12/29/21 0319 12/30/21 0341 12/31/21 0640 01/01/22 0330  NA 135 131*   < > 135 135 135  K 3.9 3.7   < > 3.6 3.3* 3.2*  CL 97* 95*   < > 96* 97* 95*  CO2 25 24   < > 24 21* 28  GLUCOSE 133* 145*   < > 151* 186* 142*  BUN 53* 55*   < > 39* 46* 14  CREATININE 8.42* 8.81*   < > 8.25* 10.46* 5.69*  CALCIUM 7.7* 7.3*   < > 7.3* 6.4* 7.1*  PHOS 4.9* 4.4  --   --   --   --    < > = values in this interval not displayed.   Liver Function Tests: Recent Labs  Lab 12/30/21 0341 12/31/21 0640 01/01/22 0330  AST 26 20 20   ALT 22 18 17   ALKPHOS 52 52 47  BILITOT 0.6 0.3 0.4  PROT 6.8 6.7 6.7  ALBUMIN 3.1* 3.2* 3.1*   Recent Labs  Lab 12/26/21 0012 12/26/21 1445  LIPASE 69* 47   CBC: Recent Labs  Lab 12/28/21 1312 12/28/21 1652 12/29/21 0319 12/30/21 0341  12/31/21 0640 01/01/22 0330  WBC 8.5  --  7.5 5.6 6.7 6.5  NEUTROABS  --   --  5.0 3.7 4.5 4.2  HGB 9.7*   < > 8.5* 8.8* 8.9* 9.0*  HCT 29.7*   < > 25.2* 25.5* 25.2* 26.7*  MCV 101.4*  --  102.4* 102.8* 99.6 101.1*  PLT 179  --  149* 154 169 160   < > = values in this interval not displayed.   Blood Culture    Component Value Date/Time   SDES  06/11/2021 2053    BLOOD BLOOD RIGHT FOREARM Performed at [facility], [address] [place], [place] [address]    [facility]  06/11/2021 2053    Blood Culture adequate volume BOTTLES DRAWN AEROBIC AND ANAEROBIC Performed at [facility], [address] [place], [place] [address]    CULT  06/11/2021 2053    NO GROWTH 5 DAYS Performed at [facility] [address] [place], [place] [address]    REPTSTATUS 06/17/2021 FINAL  06/11/2021 2053    Cardiac Enzymes: No results for input(s): "CKTOTAL", "CKMB", "CKMBINDEX", "TROPONINI" in the last 168 hours. CBG: Recent Labs  Lab 12/31/21 1138 12/31/21 1636 12/31/21 2045 12/31/21 2319 01/01/22 0458  GLUCAP 137* 122* 164* 231* 122*   Iron Studies: No results for input(s): "IRON", "TIBC", "TRANSFERRIN", "FERRITIN" in the last 72 hours. Lab Results  Component Value Date   INR 1.2 12/26/2021   INR 1.0 06/11/2021   INR 1.2 11/05/2020   Studies/Results: No results found.  Medications:   calcium carbonate (dosed in mg elemental calcium)  1,000 mg of elemental calcium Oral Daily   Chlorhexidine Gluconate Cloth  6 each Topical Q0600   darbepoetin (ARANESP) injection - DIALYSIS  150 mcg Intravenous Q Tue-HD   feeding supplement  1 Container Oral TID BM   insulin aspart  0-9 Units Subcutaneous Q4H   insulin glargine-yfgn  5 Units Subcutaneous QHS   metoprolol tartrate  25 mg Oral BID   molnupiravir EUA  4 capsule Oral BID   pantoprazole  40 mg Intravenous Q12H   sevelamer carbonate  2.4 g Oral TID with meals   simvastatin  20 mg Oral  QHS    Dialysis Orders: SW TTS 4hrs, 300/500 81kg 2K 3Ca  16G LU AVG Heparin 4000 bolus, 2500 intermittent  Mircera 21mcg q2wks - last 10/3 Hectorol 5mcg qHD  Assessment/Plan: GI bleeding - No acute findings on Flex Sig.  EGD w/esophagitis w/o bleeding, medium sized hiatal hernia, benign esophageal stenosis and erythematous mucosa in gastric fundus. Holding heparin with HD. To continue pantoprazole and ondansetron.  Hgb increasing.    2. Possible infectious colitis - noted on CT. On Cipro and Flagyl.    3. ESRD - on HD TTS.  HD tomorrow per regular schedule at the OP unit . Hold Heparin. Noted K+ is trending down-now 3.2, will give KCL 27meq PO X 1 today.   4. COVID-19 infection - Asymptomatic. On molnupiravir. Pain control per PMD.    5. Liver Lesions - noted on abd CT.  Will need OP MRI.   6. Hypertension/Volume - on lopressor at home.  Does not appear volume overloaded on exam. CXR & CT chest w/atelectasis vs consolidation, possible L pleural effusion. Per weights getting under edw, possible weight loss, continue UF as tolerated.  Get standing weight post HD today.    7. Anemia of CKD - Hgb drop initially but has stabilized in mid 8s, 9 this AM. Increase dose aranesp 136mcg with HD yesterday.    8. Secondary Hyperparathyroidism - Ca drop 6.4, increase Ca carbonate to 1000mg  qd.  last phos in goal. Continue Renvela powder and hectorol.   9. Nutrition - Currently on soft diet. Renal diet w/fluid restrictions when advanced. Alb 3.1 - Boost ordered.    10. Uncontrolled DMT1 - insulin regimen per primary team  Tobie Poet, NP [facility] 01/01/2022,8:37 AM  LOS: 4 days   Patient seen and examined, agree with above note with above modifications. Plans are for her to go home today -  she is ok with it-  resume HD at OP center tomrorrow-  hold heparin for a little while  Corliss Parish, MD 01/01/2022

## 2022-01-01 NOTE — Progress Notes (Signed)
D/C order noted. Contacted [facility] and spoke to staff. Clinic advised pt will d/c today and resume care tomorrow. Clinic also advised that pt is covid positive. Pt's schedule will remain the same.   Melven Sartorius Renal Navigator [phone]

## 2022-01-01 NOTE — Discharge Summary (Signed)
Physician Discharge Summary   Patient: [name] MRN: [id number] DOB: May 04, 1985  Admit date:     12/26/2021  Discharge date: 01/01/2022  Discharge Physician: Jennye Boroughs   PCP: [name], MD   Recommendations at discharge:   Follow-up with PCP in 1 week Continue outpatient hemodialysis as scheduled Follow-up with the wound care clinic for management of left foot plantar wound MRI as an outpatient recommended for multiple liver lesions noted on CT abdomen and pelvis  Discharge Diagnoses: Principal Problem:   GI bleeding Active Problems:   COVID-19 virus infection   ESRD on hemodialysis ([place])   Uncontrolled type 1 diabetes mellitus with hyperglycemia ([place])   Essential hypertension, benign   Liver lesion   Nausea and vomiting   Hematemesis with nausea   Abdominal pain, epigastric   Abnormal CT scan, colon  Resolved Problems:   * No resolved hospital problems. [facility] Course:  [name] is a 36 y.o. female with medical history significant of ESRD, DM, hypertension, pseudoseizures, gastroparesis, esophagitis, failed kidney transplant admitted with hematemesis and melena.  Hemoccult positive. COVID positive .  She was treated with IV Protonix and Molnupiravir.  She underwent EGD and colonoscopy which showed grade D esophagitis.  She was also treated with IV ciprofloxacin and Flagyl because of concerns for possible infectious colitis that was noted on CT abdomen and pelvis.  Her condition has improved and she is deemed stable for discharge to home today.  Outpatient follow-up with PCP was strongly recommended for routine health interventions and management of chronic issues.          Consultants: Nephrologist Procedures performed: None Disposition: Home Diet recommendation:  Discharge Diet Orders (From admission, onward)     Start     Ordered   01/01/22 0000  Diet renal/carb modified with fluid restriction        01/01/22 0942           Renal  diet, diabetic diet DISCHARGE MEDICATION: Allergies as of 01/01/2022       Reactions   Diphenhydramine Anaphylaxis   Motrin [ibuprofen] Shortness Of Breath, Itching   Contrast Media [iodinated Contrast Media] Itching   Peanut-containing Drug Products Itching   Able to tolerate when cooked in foods   Shellfish Allergy Hives   Banana Itching, Nausea And Vomiting, Other (See Comments)   Sick on the stomach   Chlorhexidine Itching   Ferrous Sulfate Itching   Iron Dextran Itching, Other (See Comments)   Vein irritation        Medication List     STOP taking these medications    lidocaine 5 % Commonly known as: Lidoderm   sucralfate 1 GM/10ML suspension Commonly known as: CARAFATE       TAKE these medications    Accu-Chek Softclix Lancets lancets Use to check blood sugar 4 times per day.   acetaminophen 500 MG tablet Commonly known as: TYLENOL Take 500-1,000 mg by mouth every 6 (six) hours as needed for moderate pain.   Aspercreme/Aloe 10 % cream Generic drug: trolamine salicylate Apply 1 application. topically as needed for muscle pain.   BD Pen Needle Nano 2nd Gen 32G X 4 MM Misc Generic drug: Insulin Pen Needle 1 each by Does not apply route in the morning, at noon, in the evening, and at bedtime.   calcium acetate 667 MG capsule Commonly known as: PHOSLO Take 1,334 mg by mouth 3 (three) times daily with meals. Take 1-2 capsules ([phone] mg) by mouth  with snacks & take 2 capsules (1334 mg) by mouth with each meal   calcium carbonate (dosed in mg elemental calcium) 1250 MG/5ML Susp Take 500 mg of elemental calcium by mouth daily.   cetirizine 10 MG tablet Commonly known as: ZYRTEC Take 10 mg by mouth daily as needed for allergies.   famotidine 40 MG tablet Commonly known as: PEPCID Take 1 tablet (40 mg total) by mouth 2 (two) times daily as needed for heartburn or indigestion.   fluticasone 50 MCG/ACT nasal spray Commonly known as: FLONASE Place 2  sprays into both nostrils daily as needed for allergies.   gabapentin 400 MG capsule Commonly known as: NEURONTIN Take 800 mg by mouth daily as needed (pain).   hydrOXYzine 50 MG tablet Commonly known as: ATARAX Take 50 mg by mouth 2 (two) times daily as needed for itching.   insulin degludec 100 UNIT/ML FlexTouch Pen Commonly known as: TRESIBA Inject 5-10 Units into the skin daily.   lidocaine-prilocaine cream Commonly known as: EMLA Apply 1 application topically daily as needed [facility]).   loperamide 2 MG capsule Commonly known as: IMODIUM Take 2 mg by mouth as needed for diarrhea or loose stools.   metoprolol tartrate 25 MG tablet Commonly known as: LOPRESSOR Take 1 tablet (25 mg total) by mouth daily as needed (for systolic blood pressure or 135 or above).   NovoLOG FlexPen 100 UNIT/ML FlexPen Generic drug: insulin aspart Inject 0-15 Units into the skin in the morning, at noon, in the evening, and at bedtime. Sliding Scale insulin   omeprazole 40 MG capsule Commonly known as: PRILOSEC Take 1 capsule (40 mg total) by mouth 2 (two) times daily before a meal. Open capsule and place granules in water or applesauce to swallow   ondansetron 8 MG disintegrating tablet Commonly known as: ZOFRAN-ODT Take 1 tablet (8 mg total) by mouth every 8 (eight) hours as needed for nausea or vomiting.   oxyCODONE-acetaminophen 5-325 MG tablet Commonly known as: PERCOCET/ROXICET Take 1 tablet by mouth daily as needed for severe pain.   Renvela 2.4 g Pack Generic drug: sevelamer carbonate Take 2.4 g by mouth 3 (three) times daily.   silver sulfADIAZINE 1 % cream Commonly known as: SILVADENE Apply 1 Application topically daily as needed (wound care).   simvastatin 20 MG tablet Commonly known as: ZOCOR Take 20 mg by mouth at bedtime.   tiZANidine 2 MG tablet Commonly known as: ZANAFLEX Take 2 mg by mouth 2 (two) times daily as needed for muscle spasms.                Discharge Care Instructions  (From admission, onward)           Start     Ordered   01/01/22 0000  Discharge wound care:       Comments: Dress left plantar foot wound daily with betadine   01/01/22 6440            Follow-up Information     [name], MD Follow up.   Specialty: Internal Medicine Contact information: [address] [place] [address] [phone]                Discharge Exam: Filed Weights   12/29/21 1755 12/29/21 2034 12/31/21 0905  Weight: 78 kg 75.6 kg 79.7 kg   GEN: NAD SKIN: Left plantar foot wound EYES: No pallor or icterus ENT: MMM CV: RRR PULM: CTA B ABD: soft, ND, NT, +BS CNS: AAO x 3, non focal EXT:  No edema or tenderness   Condition at discharge: good  The results of significant diagnostics from this hospitalization (including imaging, microbiology, ancillary and laboratory) are listed below for reference.   Imaging Studies: CT Angio Chest/Abd/Pel for Dissection W and/or Wo Contrast  Result Date: 12/27/2021 CLINICAL DATA:  Acute aortic syndrome suspected EXAM: CT ANGIOGRAPHY CHEST, ABDOMEN AND PELVIS TECHNIQUE: Non-contrast CT of the chest was initially obtained. Multidetector CT imaging through the chest, abdomen and pelvis was performed using the standard protocol during bolus administration of intravenous contrast. Multiplanar reconstructed images and MIPs were obtained and reviewed to evaluate the vascular anatomy. RADIATION DOSE REDUCTION: This exam was performed according to the departmental dose-optimization program which includes automated exposure control, adjustment of the mA and/or kV according to patient size and/or use of iterative reconstruction technique. CONTRAST:  158mL OMNIPAQUE IOHEXOL 350 MG/ML SOLN COMPARISON:  Same day chest radiograph, CT abdomen/pelvis 1 day prior FINDINGS: CTA CHEST FINDINGS Cardiovascular: There is no evidence of acute intramural hematoma on the initial noncontrast images.  There is no evidence of aortic aneurysm or dissection of the postcontrast images. There is no evidence of pulmonary embolism. The heart size is normal. Coronary artery calcifications are seen, accelerated for age. Mediastinum/Nodes: The thyroid is unremarkable. The esophagus is grossly unremarkable. There is a prominent precarinal lymph node measuring up to 1.3 cm in the axial plane, also present in the PET-CT from 2010 and only minimally increased in size, nonspecific. There is no mediastinal, hilar, or axillary lymphadenopathy. Lungs/Pleura: The trachea and central airways are patent. There are new patchy and consolidative opacities in both lower lobes not present on the CT abdomen/pelvis from 1 day prior. The upper lobes and right middle lobe are well-aerated. There is no pulmonary edema. There is no pleural effusion or pneumothorax. There are no suspicious nodules. Musculoskeletal: There is no acute osseous abnormality or suspicious osseous lesion. Review of the MIP images confirms the above findings. CTA ABDOMEN AND PELVIS FINDINGS VASCULAR Aorta: Normal caliber aorta without aneurysm, dissection, vasculitis or significant stenosis. Celiac: Patent without evidence of aneurysm, dissection, vasculitis or significant stenosis. SMA: Patent without evidence of aneurysm, dissection, vasculitis or significant stenosis. Renals: A right renal artery is not identified. A markedly diminutive left renal artery is seen. There is corresponding marked left renal atrophy. IMA: Patent without evidence of aneurysm, dissection, vasculitis or significant stenosis. Inflow: Patent without evidence of aneurysm, dissection, vasculitis or significant stenosis. Veins: No obvious venous abnormality within the limitations of this arterial phase study. Review of the MIP images confirms the above findings. NON-VASCULAR Hepatobiliary: Ill-defined hypodense lesions are again seen in the liver (for example 5-89, 5-96), incompletely  characterized. There is no acute abnormality in the liver. The gallbladder is surgically absent. There is no biliary ductal dilatation. Pancreas: Unremarkable. Spleen: Unremarkable. Adrenals/Urinary Tract: The adrenals are unremarkable. The right kidney is absent. The markedly atrophic left kidney is unchanged. There are no focal lesions, stones, or hydroureteronephrosis. The bladder is decompressed. Stomach/Bowel: The stomach is unremarkable. There is no evidence of bowel obstruction. Mild wall thickening with mild fat stranding around the cecum is similar to the study from 1 day prior. Suspected wall thickening and mild surrounding inflammatory changes are also again seen around the rectum, also not significantly changed. There is no new or progressive abnormal bowel wall thickening or inflammatory change. Appendix is normal. Lymphatic: There is no abdominopelvic lymphadenopathy. Reproductive: Uterus is surgically absent. There is no adnexal mass. Other: Fibrotic scarring in the right lower quadrant  is unchanged. There is no ascites or free air. Musculoskeletal: There is no acute osseous abnormality or suspicious osseous lesion. Review of the MIP images confirms the above findings. IMPRESSION: 1. No evidence of aortic aneurysm or dissection. 2. Patchy and consolidative opacities in both lung bases favored to reflect atelectasis given normal appearance of the lung bases on the CT abdomen/pelvis from 1 day prior; however, developing multifocal infection is not entirely excluded. 3. Unchanged mild wall thickening and inflammatory change about the cecum and rectum suspicious for nonspecific infectious or inflammatory colitis. 4. Coronary artery calcifications, accelerated for age. 5. Ill-defined hypodense liver lesions again seen for which nonemergent outpatient multiphase abdominal MRI with and without contrast is recommended for characterization. Electronically Signed   By: Valetta Mole M.D.   On: 12/27/2021 13:37    DG Chest Portable 1 View  Result Date: 12/27/2021 CLINICAL DATA:  36 year old female with history of severe chest pain. History of sickle cell disease. Shortness of breath. EXAM: PORTABLE CHEST 1 VIEW COMPARISON:  Chest x-ray 09/28/2021. FINDINGS: Lung volumes are low. Ill-defined opacity at the left base which may reflect atelectasis and/or consolidation. Mild blunting of the left costophrenic sulcus concerning for a small left pleural effusion. Right lung is clear. No right pleural effusion. No pneumothorax. No evidence of pulmonary edema. Heart size is normal. Upper mediastinal contours are within normal limits. IMPRESSION: 1. Low lung volumes with atelectasis and/or consolidation in the left lung base and probable small left pleural effusion. Electronically Signed   By: Vinnie Langton M.D.   On: 12/27/2021 08:58   CT ABDOMEN PELVIS WO CONTRAST  Result Date: 12/26/2021 CLINICAL DATA:  Abdominal pain, vomiting, hemodialysis today EXAM: CT ABDOMEN AND PELVIS WITHOUT CONTRAST TECHNIQUE: Multidetector CT imaging of the abdomen and pelvis was performed following the standard protocol without IV contrast. RADIATION DOSE REDUCTION: This exam was performed according to the departmental dose-optimization program which includes automated exposure control, adjustment of the mA and/or kV according to patient size and/or use of iterative reconstruction technique. COMPARISON:  06/10/2021 05/30/2021, 02/10/2021, numerous additional prior examination FINDINGS: Lower chest: No acute abnormality. Hepatobiliary: Multiple somewhat ill-defined hypodense lesions throughout the liver, for example in the anterior liver dome, hepatic segment VIII (series 2, image 8), in the central right lobe of the liver, segment VII/VIII, (series 2, image 12), in the inferior right lobe of the liver segment VI (series 2, image 28), and in the left lobe, hepatic segment II (series 2, image 14). Status post cholecystectomy. No biliary  ductal dilatation. Pancreas: Unremarkable. No pancreatic ductal dilatation or surrounding inflammatory changes. Spleen: Normal in size without significant abnormality. Adrenals/Urinary Tract: Adrenal glands are unremarkable. Right kidney is absent. Left kidney is severely atrophic. No hydronephrosis. Bladder is unremarkable. Stomach/Bowel: Stomach is within normal limits. Appendix appears normal. Circumferential wall thickening and mild fat stranding about the cecum, with significant fibrofatty mural stratification (series 2, image 38). Probable additional circumferential wall thickening and fat stranding about the rectum (series 6, image 65). Vascular/Lymphatic: No significant vascular findings are present. No enlarged abdominal or pelvic lymph nodes. Reproductive: No mass or other significant abnormality. Other: No abdominal wall hernia or abnormality. No ascites. 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 Musculoskeletal: No acute or significant osseous findings. IMPRESSION: 1. Circumferential wall thickening and mild fat stranding about the cecum, with significant fibrofatty mural stratification. Probable additional circumferential wall thickening and fat stranding about the rectum. Findings are  consistent with nonspecific infectious or inflammatory colitis. 2. Multiple somewhat ill-defined hypodense lesions throughout the liver. These are incompletely characterized on this noncontrast examination however are similar to multiple prior examinations and possibly reflecting hemangiomata or other benign lesions such as focal nodular hyperplasias. As previously reported, recommend nonemergent multiphasic contrast enhanced CT or MRI to fully characterize. 3. Right kidney is absent. Left kidney is severely atrophic. 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  Electronically Signed   By: Delanna Ahmadi M.D.   On: 12/26/2021 16:11    Microbiology: Results for orders placed or performed during the hospital encounter of 12/26/21  Resp Panel by RT-PCR (Flu A&B, Covid) Anterior Nasal Swab     Status: Abnormal   Collection Time: 12/27/21 11:18 AM   Specimen: Anterior Nasal Swab  Result Value Ref Range Status   SARS Coronavirus 2 by RT PCR POSITIVE (A) NEGATIVE Final    Comment: (NOTE) SARS-CoV-2 target nucleic acids are DETECTED.  The SARS-CoV-2 RNA is generally detectable in upper respiratory specimens during the acute phase of infection. Positive results are indicative of the presence of the identified virus, but do not rule out bacterial infection or co-infection with other pathogens not detected by the test. Clinical correlation with patient history and other diagnostic information is necessary to determine patient infection status. The expected result is Negative.  Fact Sheet for Patients: EntrepreneurPulse.com.au  Fact Sheet for Healthcare Providers: IncredibleEmployment.be  This test is not yet approved or cleared by the [place] FDA and  has been authorized for detection and/or diagnosis of SARS-CoV-2 by FDA under an Emergency Use Authorization (EUA).  This EUA will remain in effect (meaning this test can be used) for the duration of  the COVID-19 declaration under Section 564(b)(1) of the A ct, 21 U.S.C. section 360bbb-3(b)(1), unless the authorization is terminated or revoked sooner.     Influenza A by PCR NEGATIVE NEGATIVE Final   Influenza B by PCR NEGATIVE NEGATIVE Final    Comment: (NOTE) The Xpert Xpress SARS-CoV-2/FLU/RSV plus assay is intended as an aid in the diagnosis of influenza from Nasopharyngeal swab specimens and should not be used as a sole basis for treatment. Nasal washings and aspirates are unacceptable for Xpert Xpress SARS-CoV-2/FLU/RSV testing.  Fact Sheet for  Patients: EntrepreneurPulse.com.au  Fact Sheet for Healthcare Providers: IncredibleEmployment.be  This test is not yet approved or cleared by the [place] FDA and has been authorized for detection and/or diagnosis of SARS-CoV-2 by FDA under an Emergency Use Authorization (EUA). This EUA will remain in effect (meaning this test can be used) for the duration of the COVID-19 declaration under Section 564(b)(1) of the Act, 21 U.S.C. section 360bbb-3(b)(1), unless the authorization is terminated or revoked.  Performed at [facility], [address] [place], [place] [address]     Labs: CBC: Recent Labs  Lab 12/28/21 [phone] 12/28/21 1007 12/28/21 1312 12/28/21 1652 12/29/21 0319 12/30/21 0341 12/31/21 0640 01/01/22 0330  WBC 11.4*  --  8.5  --  7.5 5.6 6.7 6.5  NEUTROABS 8.3*  --   --   --  5.0 3.7 4.5 4.2  HGB 8.8*   < > 9.7* 8.3* 8.5* 8.8* 8.9* 9.0*  HCT 27.7*   < > 29.7* 25.4* 25.2* 25.5* 25.2* 26.7*  MCV 101.8*  --  101.4*  --  102.4* 102.8* 99.6 101.1*  PLT 172  --  179  --  149* 154 169 160   < > =  values in this interval not displayed.   Basic Metabolic Panel: Recent Labs  Lab 12/28/21 0314 12/28/21 1007 12/29/21 0319 12/30/21 0341 12/31/21 0640 01/01/22 0330  NA 135 131* 133* 135 135 135  K 3.9 3.7 4.2 3.6 3.3* 3.2*  CL 97* 95* 99 96* 97* 95*  CO2 25 24 20* 24 21* 28  GLUCOSE 133* 145* 129* 151* 186* 142*  BUN 53* 55* 60* 39* 46* 14  CREATININE 8.42* 8.81* 10.00* 8.25* 10.46* 5.69*  CALCIUM 7.7* 7.3* 6.9* 7.3* 6.4* 7.1*  MG 1.9  --   --   --   --   --   PHOS 4.9* 4.4  --   --   --   --    Liver Function Tests: Recent Labs  Lab 12/28/21 0314 12/28/21 1007 12/29/21 0319 12/30/21 0341 12/31/21 0640 01/01/22 0330  AST 33  --  24 26 20 20   ALT 26  --  21 22 18 17   ALKPHOS 50  --  42 52 52 47  BILITOT 0.5  --  0.6 0.6 0.3 0.4  PROT 7.6  --  6.8 6.8 6.7 6.7  ALBUMIN 3.6 3.7 3.1* 3.1* 3.2*  3.1*   CBG: Recent Labs  Lab 12/31/21 1138 12/31/21 1636 12/31/21 2045 12/31/21 2319 01/01/22 0458  GLUCAP 137* 122* 164* 231* 122*    Discharge time spent: greater than 30 minutes.  Signed: Jennye Boroughs, MD [facility] 01/01/2022

## 2022-01-01 NOTE — Plan of Care (Signed)
Problem: Education: Goal: Knowledge of General Education information will improve Description: Including pain rating scale, medication(s)/side effects and non-pharmacologic comfort measures 01/01/2022 1316 by Kieth Brightly, LPN Outcome: Adequate for Discharge 01/01/2022 1316 by Kieth Brightly, LPN Outcome: Adequate for Discharge   Problem: Health Behavior/Discharge Planning: Goal: Ability to manage health-related needs will improve 01/01/2022 1316 by Kieth Brightly, LPN Outcome: Adequate for Discharge 01/01/2022 1316 by Kieth Brightly, LPN Outcome: Adequate for Discharge   Problem: Clinical Measurements: Goal: Ability to maintain clinical measurements within normal limits will improve 01/01/2022 1316 by Kieth Brightly, LPN Outcome: Adequate for Discharge 01/01/2022 1316 by Kieth Brightly, LPN Outcome: Adequate for Discharge Goal: Will remain free from infection 01/01/2022 1316 by Kieth Brightly, LPN Outcome: Adequate for Discharge 01/01/2022 1316 by Kieth Brightly, LPN Outcome: Adequate for Discharge Goal: Diagnostic test results will improve 01/01/2022 1316 by Kieth Brightly, LPN Outcome: Adequate for Discharge 01/01/2022 1316 by Kieth Brightly, LPN Outcome: Adequate for Discharge Goal: Respiratory complications will improve 01/01/2022 1316 by Kieth Brightly, LPN Outcome: Adequate for Discharge 01/01/2022 1316 by Kieth Brightly, LPN Outcome: Adequate for Discharge Goal: Cardiovascular complication will be avoided 01/01/2022 1316 by Kieth Brightly, LPN Outcome: Adequate for Discharge 01/01/2022 1316 by Kieth Brightly, LPN Outcome: Adequate for Discharge   Problem: Activity: Goal: Risk for activity intolerance will decrease 01/01/2022 1316 by Kieth Brightly, LPN Outcome: Adequate for Discharge 01/01/2022 1316 by Kieth Brightly, LPN Outcome: Adequate for Discharge   Problem:  Nutrition: Goal: Adequate nutrition will be maintained 01/01/2022 1316 by Kieth Brightly, LPN Outcome: Adequate for Discharge 01/01/2022 1316 by Kieth Brightly, LPN Outcome: Adequate for Discharge   Problem: Coping: Goal: Level of anxiety will decrease 01/01/2022 1316 by Kieth Brightly, LPN Outcome: Adequate for Discharge 01/01/2022 1316 by Kieth Brightly, LPN Outcome: Adequate for Discharge   Problem: Elimination: Goal: Will not experience complications related to bowel motility 01/01/2022 1316 by Kieth Brightly, LPN Outcome: Adequate for Discharge 01/01/2022 1316 by Kieth Brightly, LPN Outcome: Adequate for Discharge Goal: Will not experience complications related to urinary retention 01/01/2022 1316 by Kieth Brightly, LPN Outcome: Adequate for Discharge 01/01/2022 1316 by Kieth Brightly, LPN Outcome: Adequate for Discharge   Problem: Pain Managment: Goal: General experience of comfort will improve 01/01/2022 1316 by Kieth Brightly, LPN Outcome: Adequate for Discharge 01/01/2022 1316 by Kieth Brightly, LPN Outcome: Adequate for Discharge   Problem: Safety: Goal: Ability to remain free from injury will improve 01/01/2022 1316 by Kieth Brightly, LPN Outcome: Adequate for Discharge 01/01/2022 1316 by Kieth Brightly, LPN Outcome: Adequate for Discharge   Problem: Skin Integrity: Goal: Risk for impaired skin integrity will decrease 01/01/2022 1316 by Kieth Brightly, LPN Outcome: Adequate for Discharge 01/01/2022 1316 by Kieth Brightly, LPN Outcome: Adequate for Discharge   Problem: Education: Goal: Ability to describe self-care measures that may prevent or decrease complications (Diabetes Survival Skills Education) will improve 01/01/2022 1316 by Kieth Brightly, LPN Outcome: Adequate for Discharge 01/01/2022 1316 by Kieth Brightly, LPN Outcome: Adequate for Discharge Goal: Individualized  Educational Video(s) 01/01/2022 1316 by Kieth Brightly, LPN Outcome: Adequate for Discharge 01/01/2022 1316 by Kieth Brightly, LPN Outcome: Adequate for Discharge   Problem: Coping: Goal: Ability to adjust to condition or change in health will improve 01/01/2022 1316 by Kieth Brightly, LPN Outcome: Adequate for Discharge 01/01/2022 1316 by Kieth Brightly,  LPN Outcome: Adequate for Discharge   Problem: Fluid Volume: Goal: Ability to maintain a balanced intake and output will improve 01/01/2022 1316 by Kieth Brightly, LPN Outcome: Adequate for Discharge 01/01/2022 1316 by Kieth Brightly, LPN Outcome: Adequate for Discharge   Problem: Health Behavior/Discharge Planning: Goal: Ability to identify and utilize available resources and services will improve 01/01/2022 1316 by Kieth Brightly, LPN Outcome: Adequate for Discharge 01/01/2022 1316 by Kieth Brightly, LPN Outcome: Adequate for Discharge Goal: Ability to manage health-related needs will improve 01/01/2022 1316 by Kieth Brightly, LPN Outcome: Adequate for Discharge 01/01/2022 1316 by Kieth Brightly, LPN Outcome: Adequate for Discharge   Problem: Metabolic: Goal: Ability to maintain appropriate glucose levels will improve 01/01/2022 1316 by Kieth Brightly, LPN Outcome: Adequate for Discharge 01/01/2022 1316 by Kieth Brightly, LPN Outcome: Adequate for Discharge   Problem: Nutritional: Goal: Maintenance of adequate nutrition will improve 01/01/2022 1316 by Kieth Brightly, LPN Outcome: Adequate for Discharge 01/01/2022 1316 by Kieth Brightly, LPN Outcome: Adequate for Discharge Goal: Progress toward achieving an optimal weight will improve 01/01/2022 1316 by Kieth Brightly, LPN Outcome: Adequate for Discharge 01/01/2022 1316 by Kieth Brightly, LPN Outcome: Adequate for Discharge   Problem: Skin Integrity: Goal: Risk for impaired skin  integrity will decrease 01/01/2022 1316 by Kieth Brightly, LPN Outcome: Adequate for Discharge 01/01/2022 1316 by Kieth Brightly, LPN Outcome: Adequate for Discharge   Problem: Tissue Perfusion: Goal: Adequacy of tissue perfusion will improve 01/01/2022 1316 by Kieth Brightly, LPN Outcome: Adequate for Discharge 01/01/2022 1316 by Kieth Brightly, LPN Outcome: Adequate for Discharge   Problem: Education: Goal: Knowledge of risk factors and measures for prevention of condition will improve 01/01/2022 1316 by Kieth Brightly, LPN Outcome: Adequate for Discharge 01/01/2022 1316 by Kieth Brightly, LPN Outcome: Adequate for Discharge   Problem: Coping: Goal: Psychosocial and spiritual needs will be supported 01/01/2022 1316 by Kieth Brightly, LPN Outcome: Adequate for Discharge 01/01/2022 1316 by Kieth Brightly, LPN Outcome: Adequate for Discharge   Problem: Respiratory: Goal: Will maintain a patent airway 01/01/2022 1316 by Kieth Brightly, LPN Outcome: Adequate for Discharge 01/01/2022 1316 by Kieth Brightly, LPN Outcome: Adequate for Discharge Goal: Complications related to the disease process, condition or treatment will be avoided or minimized 01/01/2022 1316 by Kieth Brightly, LPN Outcome: Adequate for Discharge 01/01/2022 1316 by Kieth Brightly, LPN Outcome: Adequate for Discharge

## 2022-01-03 ENCOUNTER — Encounter (HOSPITAL_COMMUNITY): Payer: Self-pay | Admitting: Gastroenterology

## 2022-01-05 ENCOUNTER — Encounter: Payer: Self-pay | Admitting: Gastroenterology

## 2022-01-18 ENCOUNTER — Encounter (HOSPITAL_BASED_OUTPATIENT_CLINIC_OR_DEPARTMENT_OTHER): Payer: Medicare Other | Attending: General Surgery | Admitting: General Surgery

## 2022-01-18 DIAGNOSIS — Z940 Kidney transplant status: Secondary | ICD-10-CM | POA: Diagnosis not present

## 2022-01-18 DIAGNOSIS — K219 Gastro-esophageal reflux disease without esophagitis: Secondary | ICD-10-CM | POA: Insufficient documentation

## 2022-01-18 DIAGNOSIS — L97422 Non-pressure chronic ulcer of left heel and midfoot with fat layer exposed: Secondary | ICD-10-CM | POA: Insufficient documentation

## 2022-01-18 DIAGNOSIS — Q970 Karyotype 47, XXX: Secondary | ICD-10-CM | POA: Insufficient documentation

## 2022-01-18 DIAGNOSIS — N186 End stage renal disease: Secondary | ICD-10-CM | POA: Insufficient documentation

## 2022-01-18 DIAGNOSIS — N2581 Secondary hyperparathyroidism of renal origin: Secondary | ICD-10-CM | POA: Insufficient documentation

## 2022-01-18 DIAGNOSIS — E10621 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: Secondary | ICD-10-CM | POA: Diagnosis present

## 2022-01-18 DIAGNOSIS — I120 Hypertensive chronic kidney disease with stage 5 chronic kidney disease or end stage renal disease: Secondary | ICD-10-CM | POA: Diagnosis not present

## 2022-01-18 DIAGNOSIS — E1040 Type 1 diabetes mellitus with diabetic neuropathy, unspecified: Secondary | ICD-10-CM | POA: Diagnosis not present

## 2022-01-18 DIAGNOSIS — F419 Anxiety disorder, unspecified: Secondary | ICD-10-CM | POA: Insufficient documentation

## 2022-01-18 DIAGNOSIS — E1022 Type 1 diabetes mellitus with diabetic chronic kidney disease: Secondary | ICD-10-CM | POA: Diagnosis not present

## 2022-01-18 NOTE — Progress Notes (Signed)
Valerie, [name] ([id number]) [id number] Page 1 of 9 Visit Report for 01/18/2022 Allergy List Details Patient Name: Date of Service: [name], [place] SHA Y S. 01/18/2022 9:00 A M Medical Record Number: [id number] Patient Account Number: [id number] Date of Birth/Sex: Treating RN: 02-Mar-1986 (36 y.o. [name] Primary Care Chosen Geske: [name] Other Clinician: Referring Dollie Bressi: Treating Teriana Danker/Extender: Warren Danes, BERNA RD Weeks in Treatment: 0 Allergies Active Allergies diphenhydramine Reaction: anaphylaxis ibuprofen Reaction: SOB, itching Iodinated Contrast Media Reaction: itch peanut Reaction: itching Shellfish Containing Products Reaction: hives banana Reaction: nausea/vomiting chlorhexidine Reaction: itching ferrous sulfate Reaction: itching iron dextran complex Reaction: itch Allergy Notes Electronic Signature(s) Signed: 01/18/2022 4:54:31 PM By: Baruch Gouty RN, BSN Entered By: Baruch Gouty on 01/18/2022 09:25:28 -------------------------------------------------------------------------------- Arrival Information Details Patient Name: Date of Service: [place] [name], DA SHA Y S. 01/18/2022 9:00 A M Medical Record Number: [id number] Patient Account Number: [id number] Date of Birth/Sex: Treating RN: 04/02/1985 (36 y.o. F) Primary Care Chen Holzman: [name] Other Clinician: Referring Olga Bourbeau: Treating Shamonique Battiste/Extender: Warren Danes, BERNA RD Weeks in Treatment: 0 Visit Information Patient Arrived: Amelia, [place] ([id number]) [id number] Page 2 of 9 Arrival Time: 09:14 Accompanied By: self Transfer Assistance: None Patient Identification Verified: Yes Secondary Verification Process Completed: Yes Patient Requires Transmission-Based Precautions: No Patient Has Alerts: No Electronic Signature(s) Signed: 01/18/2022 9:28:04 AM By: Worthy Rancher Entered By: Worthy Rancher on  01/18/2022 09:15:03 -------------------------------------------------------------------------------- Clinic Level of Care Assessment Details Patient Name: Date of Service: [name]. 01/18/2022 9:00 A M Medical Record Number: [id number] Patient Account Number: [id number] Date of Birth/Sex: Treating RN: 03/28/1985 (36 y.o. [name] Primary Care Grafton Warzecha: [name] Other Clinician: Referring Shakai Dolley: Treating Jamespaul Secrist/Extender: Warren Danes, BERNA RD Weeks in Treatment: 0 Clinic Level of Care Assessment Items TOOL 1 Quantity Score []  - 0 Use when EandM and Procedure is performed on INITIAL visit ASSESSMENTS - Nursing Assessment / Reassessment X- 1 20 General Physical Exam (combine w/ comprehensive assessment (listed just below) when performed on new pt. evals) X- 1 25 Comprehensive Assessment (HX, ROS, Risk Assessments, Wounds Hx, etc.) ASSESSMENTS - Wound and Skin Assessment / Reassessment []  - 0 Dermatologic / Skin Assessment (not related to wound area) ASSESSMENTS - Ostomy and/or Continence Assessment and Care []  - 0 Incontinence Assessment and Management []  - 0 Ostomy Care Assessment and Management (repouching, etc.) PROCESS - Coordination of Care X - Simple Patient / Family Education for ongoing care 1 15 []  - 0 Complex (extensive) Patient / Family Education for ongoing care X- 1 10 Staff obtains Programmer, systems, Records, T Results / Process Orders est []  - 0 Staff telephones HHA, Nursing Homes / Clarify orders / etc []  - 0 Routine Transfer to another Facility (non-emergent condition) []  - 0 Routine Hospital Admission (non-emergent condition) X- 1 15 New Admissions / Biomedical engineer / Ordering NPWT Apligraf, etc. , []  - 0 Emergency Hospital Admission (emergent condition) PROCESS - Special Needs []  - 0 Pediatric / Minor Patient Management []  - 0 Isolation Patient Management []  - 0 Hearing / Language / Visual special needs []   - 0 Assessment of Community assistance (transportation, D/C planning, etc.) []  - 0 Additional assistance / Altered mentation []  - 0 Support Surface(s) Assessment (bed, cushion, seat, etc.) INTERVENTIONS - Miscellaneous []  - 0 External ear exam SELINE, ENZOR ([id number]) [id number] Page 3 of 9 []  - 0 Patient Transfer (multiple staff / Civil Service fast streamer / Similar devices) []  -  0 Simple Staple / Suture removal (25 or less) []  - 0 Complex Staple / Suture removal (26 or more) []  - 0 Hypo/Hyperglycemic Management (do not check if billed separately) X- 1 15 Ankle / Brachial Index (ABI) - do not check if billed separately Has the patient been seen at the hospital within the last three years: Yes Total Score: 100 Level Of Care: New/Established - Level 3 Electronic Signature(s) Signed: 01/18/2022 4:54:31 PM By: Baruch Gouty RN, BSN Entered By: Baruch Gouty on 01/18/2022 11:20:02 -------------------------------------------------------------------------------- Encounter Discharge Information Details Patient Name: Date of Service: [name], DA SHA Y S. 01/18/2022 9:00 A M Medical Record Number: [id number] Patient Account Number: [id number] Date of Birth/Sex: Treating RN: 1986/03/01 (36 y.o. [name] Primary Care Saveah Bahar: [name] Other Clinician: Referring Sophie Tamez: Treating Lelia Jons/Extender: Warren Danes, BERNA RD Weeks in Treatment: 0 Encounter Discharge Information Items Post Procedure Vitals Discharge Condition: Stable Temperature (F): 98.5 Ambulatory Status: Ambulatory Pulse (bpm): 102 Discharge Destination: Home Respiratory Rate (breaths/min): 18 Transportation: Private Auto Blood Pressure (mmHg): 147/94 Accompanied By: self Schedule Follow-up Appointment: Yes Clinical Summary of Care: Patient Declined Electronic Signature(s) Signed: 01/18/2022 4:54:31 PM By: Baruch Gouty RN, BSN Entered By: Baruch Gouty on 01/18/2022  11:24:53 -------------------------------------------------------------------------------- Lower Extremity Assessment Details Patient Name: Date of Service: [name] Y S. 01/18/2022 9:00 A M Medical Record Number: [id number] Patient Account Number: [id number] Date of Birth/Sex: Treating RN: [birth year] (36 y.o. [name] Primary Care Caya Soberanis: [name] Other Clinician: Referring Jayel Scaduto: Treating Aprel Egelhoff/Extender: Warren Danes, BERNA RD Weeks in Treatment: 0 Edema Assessment Assessed: [Left: No] [Right: No] Edema: [Left: N] [Right: o] Calf Left: Right: Point of Measurement: From Medial Instep 38 cm Ankle OBERA, STAUCH ([id number]) [id number] Page 4 of 9 Left: Right: Point of Measurement: From Medial Instep 21.5 cm Vascular Assessment Pulses: Dorsalis Pedis Palpable: [Left:Yes] Blood Pressure: Brachial: [Left:147] Dorsalis Pedis: 162 Ankle: Posterior Tibial: 192 Ankle Brachial Index: [Left:1.31] Electronic Signature(s) Signed: 01/18/2022 4:54:31 PM By: Baruch Gouty RN, BSN Entered By: Baruch Gouty on 01/18/2022 09:56:19 -------------------------------------------------------------------------------- Multi Wound Chart Details Patient Name: Date of Service: [name], DA SHA Y S. 01/18/2022 9:00 A M Medical Record Number: [id number] Patient Account Number: [id number] Date of Birth/Sex: Treating RN: 04/15/1985 (36 y.o. F) Primary Care Terressa Evola: [name] Other Clinician: Referring Cordaryl Decelles: Treating Raequon Catanzaro/Extender: Warren Danes, BERNA RD Weeks in Treatment: 0 Vital Signs Height(in): 66 Capillary Blood Glucose(mg/dl): 228 Weight(lbs): 174 Pulse(bpm): 102 Body Mass Index(BMI): 28.1 Blood Pressure(mmHg): 147/94 Temperature(F): 98.5 Respiratory Rate(breaths/min): 20 [1:Photos:] [N/A:N/A] Left, Plantar Foot N/A N/A Wound Location: Gradually Appeared N/A N/A Wounding Event: Diabetic  Wound/Ulcer of the Lower N/A N/A Primary Etiology: Extremity Anemia, Hypertension, Type I N/A N/A Comorbid History: Diabetes, End Stage Renal Disease, Neuropathy, Confinement Anxiety 11/13/2021 N/A N/A Date Acquired: 0 N/A N/A Weeks of Treatment: Open N/A N/A Wound Status: No N/A N/A Wound Recurrence: 0.9x1x0.3 N/A N/A Measurements L x W x D (cm) 0.707 N/A N/A A (cm) : rea 0.212 N/A N/A Volume (cm) : Grade 1 N/A N/A Classification: Medium N/A N/A Exudate A mount: Serosanguineous N/A N/A Exudate Type: red, brown N/A N/A Exudate Color: Distinct, outline attached N/A N/A Wound Margin: Large (67-100%) N/A N/A Granulation A mount: Red N/A N/A Granulation Quality: Small (1-33%) N/A N/A Necrotic A mount: Fat Layer (Subcutaneous Tissue): Yes N/A N/A Exposed Structures: MAHAYLA, HADDAWAY ([id number]) [id number] Page 5 of 9 Fascia: No Tendon: No Muscle: No Joint:  No Bone: No None N/A N/A Epithelialization: Debridement - Selective/Open Wound N/A N/A Debridement: Pre-procedure Verification/Time Out 10:10 N/A N/A Taken: Lidocaine 4% Topical Solution N/A N/A Pain Control: Other, Callus N/A N/A Tissue Debrided: Skin/Epidermis N/A N/A Level: 0.9 N/A N/A Debridement A (sq cm): rea Curette N/A N/A Instrument: Minimum N/A N/A Bleeding: Pressure N/A N/A Hemostasis A chieved: 0 N/A N/A Procedural Pain: 0 N/A N/A Post Procedural Pain: Procedure was tolerated well N/A N/A Debridement Treatment Response: 0.9x1x0.3 N/A N/A Post Debridement Measurements L x W x D (cm) 0.212 N/A N/A Post Debridement Volume: (cm) Callus: Yes N/A N/A Periwound Skin Texture: No Abnormalities Noted N/A N/A Periwound Skin Moisture: No Abnormalities Noted N/A N/A Periwound Skin Color: No Abnormality N/A N/A Temperature: Debridement N/A N/A Procedures Performed: Treatment Notes Electronic Signature(s) Signed: 01/18/2022 10:50:44 AM By: Fredirick Maudlin MD  FACS Entered By: Fredirick Maudlin on 01/18/2022 10:50:44 -------------------------------------------------------------------------------- Multi-Disciplinary Care Plan Details Patient Name: Date of Service: [name], DA SHA Y S. 01/18/2022 9:00 A M Medical Record Number: [id number] Patient Account Number: [id number] Date of Birth/Sex: Treating RN: [birth year] (36 y.o. [name] Primary Care Autymn Omlor: [name] Other Clinician: Referring Daritza Brees: Treating Kimball Appleby/Extender: Warren Danes, BERNA RD Weeks in Treatment: [place] reviewed with physician Active Inactive Nutrition Nursing Diagnoses: Impaired glucose control: actual or potential Potential for alteratiion in Nutrition/Potential for imbalanced nutrition Goals: Patient/caregiver agrees to and verbalizes understanding of need to use nutritional supplements and/or vitamins as prescribed Date Initiated: 01/18/2022 Target Resolution Date: 02/15/2022 Goal Status: Active Patient/caregiver will maintain therapeutic glucose control Date Initiated: 01/18/2022 Target Resolution Date: 02/15/2022 Goal Status: Active Interventions: Assess HgA1c results as ordered upon admission and as needed Assess patient nutrition upon admission and as needed per policy Provide education on elevated blood sugars and impact on wound healing Treatment Activities: Patient referred to Primary Care Physician for further nutritional evaluation : 01/18/2022 AERYN, MEDICI ([id number]) [id number] Page 6 of 9 Notes: Wound/Skin Impairment Nursing Diagnoses: Impaired tissue integrity Knowledge deficit related to ulceration/compromised skin integrity Goals: Patient/caregiver will verbalize understanding of skin care regimen Date Initiated: 01/18/2022 Target Resolution Date: 02/15/2022 Goal Status: Active Ulcer/skin breakdown will have a volume reduction of 30% by week 4 Date Initiated:  01/18/2022 Target Resolution Date: 02/15/2022 Goal Status: Active Interventions: Assess patient/caregiver ability to obtain necessary supplies Assess patient/caregiver ability to perform ulcer/skin care regimen upon admission and as needed Assess ulceration(s) every visit Provide education on ulcer and skin care Treatment Activities: Skin care regimen initiated : 01/18/2022 Topical wound management initiated : 01/18/2022 Notes: Electronic Signature(s) Signed: 01/18/2022 4:54:31 PM By: Baruch Gouty RN, BSN Entered By: Baruch Gouty on 01/18/2022 11:16:59 -------------------------------------------------------------------------------- Pain Assessment Details Patient Name: Date of Service: [name], DA SHA Y S. 01/18/2022 9:00 A M Medical Record Number: [id number] Patient Account Number: [id number] Date of Birth/Sex: Treating RN: [birth year] (36 y.o. [name] Primary Care Ayano Douthitt: [name] Other Clinician: Referring Baelynn Schmuhl: Treating Fredna Stricker/Extender: Warren Danes, BERNA RD Weeks in Treatment: 0 Active Problems Location of Pain Severity and Description of Pain Patient Has Paino No Site Locations Rate the pain. Current Pain Level: 0 Pain Management and Medication Current Pain Management: SHARNAY, CASHION ([id number]) [id number] Page 7 of 9 Electronic Signature(s) Signed: 01/18/2022 4:54:31 PM By: Baruch Gouty RN, BSN Entered By: Baruch Gouty on 01/18/2022 09:51:56 -------------------------------------------------------------------------------- Patient/Caregiver Education Details Patient Name: Date of Service: [place] Larrie Kass 11/6/2023andnbsp9:00 A M Medical Record Number: [id number]  Patient Account Number: [id number] Date of Birth/Gender: Treating RN: 17-Feb-1986 (36 y.o. [name] Primary Care Physician: [name] Other Clinician: Referring Physician: Treating Physician/Extender: Loma Sender RD Weeks in Treatment: 0 Education Assessment Education Provided To: Patient Education Topics Provided Elevated Blood Sugar/ Impact on Healing: Methods: Explain/Verbal Responses: Reinforcements needed, State content correctly Offloading: Methods: Explain/Verbal Responses: Reinforcements needed, State content correctly Wound/Skin Impairment: Methods: Explain/Verbal Responses: Reinforcements needed, State content correctly Electronic Signature(s) Signed: 01/18/2022 4:54:31 PM By: Baruch Gouty RN, BSN Entered By: Baruch Gouty on 01/18/2022 11:17:21 -------------------------------------------------------------------------------- Wound Assessment Details Patient Name: Date of Service: [name], DA SHA Y S. 01/18/2022 9:00 A M Medical Record Number: [id number] Patient Account Number: [id number] Date of Birth/Sex: Treating RN: 1985/08/23 (36 y.o. [name] Primary Care Demisha Nokes: [name] Other Clinician: Referring Rikayla Demmon: Treating Faustine Tates/Extender: Warren Danes, BERNA RD Weeks in Treatment: 0 Wound Status Wound Number: 1 Primary Diabetic Wound/Ulcer of the Lower Extremity Etiology: Wound Location: Left, Plantar Foot Wound Open Wounding Event: Gradually Appeared Status: Date Acquired: 11/13/2021 Comorbid Anemia, Hypertension, Type I Diabetes, End Stage Renal Weeks Of Treatment: 0 History: Disease, Neuropathy, Confinement Anxiety Clustered Wound: No Photos ELISHEBA, MCDONNELL ([id number]) [id number] Page 8 of 9 Wound Measurements Length: (cm) 0.9 Width: (cm) 1 Depth: (cm) 0.3 Area: (cm) 0.707 Volume: (cm) 0.212 % Reduction in Area: % Reduction in Volume: Epithelialization: None Tunneling: No Undermining: No Wound Description Classification: Grade 1 Wound Margin: Distinct, outline attached Exudate Amount: Medium Exudate Type: Serosanguineous Exudate Color: red, brown Foul Odor After Cleansing:  No Slough/Fibrino Yes Wound Bed Granulation Amount: Large (67-100%) Exposed Structure Granulation Quality: Red Fascia Exposed: No Necrotic Amount: Small (1-33%) Fat Layer (Subcutaneous Tissue) Exposed: Yes Necrotic Quality: Adherent Slough Tendon Exposed: No Muscle Exposed: No Joint Exposed: No Bone Exposed: No Periwound Skin Texture Texture Color No Abnormalities Noted: No No Abnormalities Noted: Yes Callus: Yes Temperature / Pain Temperature: No Abnormality Moisture No Abnormalities Noted: Yes Treatment Notes Wound #1 (Foot) Wound Laterality: Plantar, Left Cleanser Peri-Wound Care Topical Primary Dressing Hydrofera Blue Ready Foam, 4x5 in Discharge Instruction: Apply to wound bed as instructed Secondary Dressing Optifoam Non-Adhesive Dressing, 4x4 in Discharge Instruction: Apply over primary dressing cut to make foam donut Woven Gauze Sponge, Non-Sterile 4x4 in Discharge Instruction: Apply over primary dressing as directed. Woven Gauze Sponges 2x2 in Discharge Instruction: Apply over primary dressing as directed. Secured With [employer] Surgical T 2x10 (in/yd) ape Discharge Instruction: Secure with tape as directed. Conforming Stretch Gauze Bandage Roll, Sterile 4x75 (in/in) Discharge Instruction: Secure with stretch gauze as directed. Compression SHERLONDA, FLATER ([id number]) [id number] Page 9 of 9 Compression Stockings Add-Ons Electronic Signature(s) Signed: 01/18/2022 4:54:31 PM By: Baruch Gouty RN, BSN Entered By: Baruch Gouty on 01/18/2022 09:51:25 -------------------------------------------------------------------------------- Vitals Details Patient Name: Date of Service: [place] [name], DA SHA Y S. 01/18/2022 9:00 A M Medical Record Number: [id number] Patient Account Number: [id number] Date of Birth/Sex: Treating RN: Apr 26, 1985 (36 y.o. F) Primary Care Leva Baine: [name] Other Clinician: Referring  Trinady Milewski: Treating Maleigh Bagot/Extender: Warren Danes, BERNA RD Weeks in Treatment: 0 Vital Signs Time Taken: 09:15 Temperature (F): 98.5 Height (in): 66 Pulse (bpm): 102 Weight (lbs): 174 Respiratory Rate (breaths/min): 20 Body Mass Index (BMI): 28.1 Blood Pressure (mmHg): 147/94 Capillary Blood Glucose (mg/dl): 228 Reference Range: 80 - 120 mg / dl Notes glucose per pt dexcom at present Electronic Signature(s) Signed: 01/18/2022 4:54:31 PM By: Baruch Gouty RN, BSN  Entered By: Baruch Gouty on 01/18/2022 09:22:01

## 2022-01-18 NOTE — Progress Notes (Signed)
[name] ([id number]) [phone] [id number] Page 1 of 4 Visit Report for 01/18/2022 Abuse Risk Screen Details Patient Name: Date of Service: Valerie Seal SHA Y S. 01/18/2022 9:00 A M Medical Record Number: [id number] Patient Account Number: [id number] Date of Birth/Sex: Treating RN: 06/25/1985 (36 y.o. [name] Primary Care Hiram Mciver: [name] Other Clinician: Referring Summit Arroyave: Treating Deema Juncaj/Extender: Warren Danes, BERNA RD Weeks in Treatment: 0 Abuse Risk Screen Items Answer ABUSE RISK SCREEN: Has anyone close to you tried to hurt or harm you recentlyo No Do you feel uncomfortable with anyone in your familyo No Has anyone forced you do things that you didnt want to doo No Electronic Signature(s) Signed: 01/18/2022 4:54:31 PM By: Baruch Gouty RN, BSN Entered By: Baruch Gouty on 01/18/2022 09:36:34 -------------------------------------------------------------------------------- Activities of Daily Living Details Patient Name: Date of Service: Valerie Seal SHA Y S. 01/18/2022 9:00 A M Medical Record Number: [id number] Patient Account Number: [id number] Date of Birth/Sex: Treating RN: 1986-02-03 (36 y.o. [name] Primary Care Hadlee Burback: [name] Other Clinician: Referring Breeona Waid: Treating Ballard Budney/Extender: Warren Danes, BERNA RD Weeks in Treatment: 0 Activities of Daily Living Items Answer Activities of Daily Living (Please select one for each item) Drive Automobile Completely Able T Medications ake Completely Able Use T elephone Completely Able Care for Appearance Completely Able Use T oilet Completely Able Bath / Shower Completely Able Dress Self Completely Able Feed Self Completely Able Walk Completely Able Get In / Out Bed Completely Able Housework Completely Able Prepare Meals Completely Able Handle Money Completely Able Shop for Self Completely Able Electronic  Signature(s) Signed: 01/18/2022 4:54:31 PM By: Baruch Gouty RN, BSN Entered By: Baruch Gouty on 01/18/2022 09:37:04 [name] ([id number]) [id number] Page 2 of 4 -------------------------------------------------------------------------------- Education Screening Details Patient Name: Date of Service: [name] 01/18/2022 9:00 A M Medical Record Number: [id number] Patient Account Number: [id number] Date of Birth/Sex: Treating RN: 07/15/1985 (36 y.o. [name] Primary Care Siler Mavis: [name] Other Clinician: Referring Malaya Cagley: Treating Rakel Junio/Extender: Warren Danes, BERNA RD Weeks in Treatment: 0 Primary Learner Assessed: Patient Learning Preferences/Education Level/Primary Language Learning Preference: Explanation, Demonstration, Printed Material Highest Education Level: College or Above Preferred Language: English Cognitive Barrier Language Barrier: No Translator Needed: No Memory Deficit: No Emotional Barrier: No Cultural/Religious Beliefs Affecting Medical Care: No Physical Barrier Impaired Vision: No Impaired Hearing: No Decreased Hand dexterity: No Knowledge/Comprehension Knowledge Level: High Comprehension Level: High Ability to understand written instructions: High Ability to understand verbal instructions: High Motivation Anxiety Level: Calm Cooperation: Cooperative Education Importance: Acknowledges Need Interest in Health Problems: Asks Questions Perception: Coherent Willingness to Engage in Self-Management High Activities: Readiness to Engage in Self-Management High Activities: Electronic Signature(s) Signed: 01/18/2022 4:54:31 PM By: Baruch Gouty RN, BSN Entered By: Baruch Gouty on 01/18/2022 09:38:41 -------------------------------------------------------------------------------- Fall Risk Assessment Details Patient Name: Date of Service: [name], Valerie SHA Y S. 01/18/2022  9:00 A M Medical Record Number: [id number] Patient Account Number: [id number] Date of Birth/Sex: Treating RN: 1985-06-20 (36 y.o. [name] Primary Care Cledis Sohn: [name] Other Clinician: Referring Deairra Halleck: Treating Benoit Meech/Extender: Warren Danes, BERNA RD Weeks in Treatment: 0 Fall Risk Assessment Items Have you had 2 or more falls in the last [address] monthso 0 No [name] ([id number]) [phone] [id number] Page 3 of 4 Have you had any fall that resulted in injury in the last 12 monthso 0 No FALLS RISK SCREEN History  of falling - immediate or within 3 months 0 No Secondary diagnosis (Do you have 2 or more medical diagnoseso) 0 No Ambulatory aid None/bed rest/wheelchair/nurse 0 Yes Crutches/cane/walker 0 No Furniture 0 No Intravenous therapy Access/Saline/Heparin Lock 0 No Gait/Transferring Normal/ bed rest/ wheelchair 0 Yes Weak (short steps with or without shuffle, stooped but able to lift head while walking, may seek 0 No support from furniture) Impaired (short steps with shuffle, may have difficulty arising from chair, head down, impaired 0 No balance) Mental Status Oriented to own ability 0 Yes Electronic Signature(s) Signed: 01/18/2022 4:54:31 PM By: Baruch Gouty RN, BSN Entered By: Baruch Gouty on 01/18/2022 09:38:54 -------------------------------------------------------------------------------- Foot Assessment Details Patient Name: Date of Service: [name], Valerie SHA Y S. 01/18/2022 9:00 A M Medical Record Number: [id number] Patient Account Number: [id number] Date of Birth/Sex: Treating RN: [birth year] (36 y.o. [name] Primary Care [name]: [name] Other Clinician: Referring [name]: Treating Laquetta Racey/Extender: Warren Danes, BERNA RD Weeks in Treatment: 0 Foot Assessment Items Site Locations + = Sensation present, - = Sensation absent, C = Callus, U = Ulcer R = Redness, W = Warmth, M  = Maceration, PU = Pre-ulcerative lesion F = Fissure, S = Swelling, D = Dryness Assessment Right: Left: Other Deformity: No No Prior Foot Ulcer: No Yes Prior Amputation: No No Charcot Joint: No No Ambulatory Status: Ambulatory Without Help GaitKALLA, [name] ([id number]) [id number] Page 4 of 4 Electronic Signature(s) Signed: 01/18/2022 4:54:31 PM By: Baruch Gouty RN, BSN Entered By: Baruch Gouty on 01/18/2022 09:42:13 -------------------------------------------------------------------------------- Nutrition Risk Screening Details Patient Name: Date of Service: Valerie Piano Y S. 01/18/2022 9:00 A M Medical Record Number: [id number] Patient Account Number: [id number] Date of Birth/Sex: Treating RN: [birth year] (36 y.o. [name] Primary Care Ahlia Lemanski: [name] Other Clinician: Referring Clarkson Rosselli: Treating Korra Christine/Extender: Warren Danes, BERNA RD Weeks in Treatment: 0 Height (in): 66 Weight (lbs): 174 Body Mass Index (BMI): 28.1 Nutrition Risk Screening Items Score Screening NUTRITION RISK SCREEN: I have an illness or condition that made me change the kind and/or amount of food I eat 2 Yes I eat fewer than two meals per day 3 Yes I eat few fruits and vegetables, or milk products 0 No I have three or more drinks of beer, liquor or wine almost every day 0 No I have tooth or mouth problems that make it hard for me to eat 0 No I don't always have enough money to buy the food I need 0 No I eat alone most of the time 1 Yes I take three or more different prescribed or over-the-counter drugs a day 1 Yes Without wanting to, I have lost or gained 10 pounds in the last six months 0 No I am not always physically able to shop, [place] and/or feed myself 0 No Nutrition Protocols Good Risk Protocol Moderate Risk Protocol Provide education on elevated blood High Risk Proctocol 0 sugars and impact on wound healing, as  applicable Risk Level: High Risk Score: 7 Electronic Signature(s) Signed: 01/18/2022 4:54:31 PM By: Baruch Gouty RN, BSN Entered By: Baruch Gouty on 01/18/2022 09:39:40

## 2022-01-18 NOTE — Progress Notes (Signed)
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 Facility Procedures : CPT4 Code: 34193790 Description: 24097 - WOUND CARE VISIT-LEV 3 EST PT Modifier: 25 Quantity: 1 : CPT4 Code: 35329924 Description: 26834 - DEBRIDE WOUND 1ST 20 SQ CM OR < ICD-10 Diagnosis Description L97.422 Non-pressure chronic ulcer of left heel and midfoot with fat layer exposed Modifier: Quantity: 1 Physician Procedures : CPT4 Code Description Modifier [id number] - WC PHYS LEVEL 4 - NEW PT 25 ICD-10 Diagnosis Description L97.422 Non-pressure chronic ulcer of left heel and midfoot with fat layer exposed E10.621 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

## 2022-01-20 ENCOUNTER — Inpatient Hospital Stay: Admission: RE | Admit: 2022-01-20 | Payer: Medicare Other | Source: Ambulatory Visit

## 2022-02-08 ENCOUNTER — Encounter (HOSPITAL_BASED_OUTPATIENT_CLINIC_OR_DEPARTMENT_OTHER): Payer: Medicare Other | Admitting: General Surgery

## 2022-02-08 DIAGNOSIS — E10621 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: Secondary | ICD-10-CM | POA: Diagnosis not present
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
## 2022-02-08 NOTE — Progress Notes (Signed)
Valerie, ERTL ([id number]) [id number] Page 1 of 7 Visit Report for 02/08/2022 Arrival Information Details Patient Name: Date of Service: [name] 02/08/2022 9:30 A M Medical Record Number: [id number] Patient Account Number: [id number] Date of Birth/Sex: Treating RN: Oct 19, 1985 (36 y.o. [name] Primary Care Kaleem Sartwell: [name] Other Clinician: Referring Laxmi Choung: Treating Tate Zagal/Extender: Consuelo Pandy in Treatment: 3 Visit Information History Since Last Visit Added or deleted any medications: No Patient Arrived: Knee Scooter Any new allergies or adverse reactions: No Arrival Time: 09:50 Had a fall or experienced change in No Accompanied By: self activities of daily living that may affect Transfer Assistance: None risk of falls: Patient Identification Verified: Yes Signs or symptoms of abuse/neglect since last visito No Patient Requires Transmission-Based Precautions: No Hospitalized since last visit: No Patient Has Alerts: No Implantable device outside of the clinic excluding No cellular tissue based products placed in the center since last visit: Has Dressing in Place as Prescribed: Yes Pain Present Now: Yes Electronic Signature(s) Signed: 02/08/2022 11:05:46 AM By: Dellie Catholic RN Entered By: Dellie Catholic on 02/08/2022 09:51:20 -------------------------------------------------------------------------------- Encounter Discharge Information Details Patient Name: Date of Service: [name], DA SHA Y S. 02/08/2022 9:30 A M Medical Record Number: [id number] Patient Account Number: [id number] Date of Birth/Sex: Treating RN: 04-25-1985 (36 y.o. [name] Primary Care Jamilee Lafosse: [name] Other Clinician: Referring Djon Tith: Treating Nasiah Lehenbauer/Extender: Consuelo Pandy in Treatment: 3 Encounter Discharge Information Items Post Procedure Vitals Discharge  Condition: Stable Temperature (F): 98.6 Ambulatory Status: Knee Scooter Pulse (bpm): 93 Discharge Destination: Home Respiratory Rate (breaths/min): 16 Transportation: Private Auto Blood Pressure (mmHg): 175/111 Accompanied By: self Schedule Follow-up Appointment: Yes Clinical Summary of Care: Patient Declined Electronic Signature(s) Signed: 02/08/2022 11:05:46 AM By: Dellie Catholic RN Entered By: Dellie Catholic on 02/08/2022 11:04:48 [name] ([id number]) [id number] Page 2 of 7 -------------------------------------------------------------------------------- Lower Extremity Assessment Details Patient Name: Date of Service: [name] 02/08/2022 9:30 A M Medical Record Number: [id number] Patient Account Number: [id number] Date of Birth/Sex: Treating RN: 1985/10/18 (36 y.o. [name] Primary Care Etola Mull: [name] Other Clinician: Referring Larrie Fraizer: Treating [name]/Extender: Consuelo Pandy in Treatment: 3 Edema Assessment Assessed: [Left: No] [Right: No] Edema: [Left: N] [Right: o] Calf Left: Right: Point of Measurement: From Medial Instep 37 cm Ankle Left: Right: Point of Measurement: From Medial Instep 21.9 cm Vascular Assessment Pulses: Dorsalis Pedis Palpable: [Left:Yes] Electronic Signature(s) Signed: 02/08/2022 11:05:46 AM By: Dellie Catholic RN Entered By: Dellie Catholic on 02/08/2022 09:57:13 -------------------------------------------------------------------------------- Multi Wound Chart Details Patient Name: Date of Service: [name], DA SHA Y S. 02/08/2022 9:30 A M Medical Record Number: [id number] Patient Account Number: [id number] Date of Birth/Sex: Treating RN: 1985/10/11 (36 y.o. 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 Etiology: Extremity Anemia, Hypertension, Type I Comorbid History: Diabetes, End Stage Renal Disease, Neuropathy, Confinement  Anxiety 11/13/2021 Date Acquired:] [N/A:N/A N/A N/A N/A N/A N/A] [name], Valerie ([id number]) [1:3 Weeks of Treatment: Open Wound Status: No Wound Recurrence: 0.7x0.8x0.3 Measurements L x W x D (cm) 0.44 A (cm) : rea 0.132 Volume (cm) : 37.80% % Reduction  in A rea: 37.70% % Reduction in Volume: Grade 1 Classification: Medium Exudate A mount:  Serosanguineous Exudate Type: red, [name] Exudate Color: Distinct, outline attached Wound Margin: Large (67-100%) Granulation A mount: Red Granulation Quality: Small (1-33%) Necrotic A mount: Fat Layer (Subcutaneous Tissue): Yes N/A Exposed Structures:  Fascia: No Tendon: No Muscle: No Joint: No Bone: No None Epithelialization: Debridement - Selective/Open Wound N/A Debridement: Pre-procedure Verification/Time Out 10:10 Taken: Lidocaine 5% topical ointment Pain Control: Callus, Slough Tissue Debrided:  Non-Viable Tissue Level: 0.56 Debridement A (sq cm): rea Curette Instrument: Minimum Bleeding: Pressure Hemostasis A chieved: 0 Procedural Pain: 0 Post Procedural Pain: Procedure was tolerated well Debridement Treatment Response: 0.7x0.8x0.3 Post  Debridement Measurements L x W x D (cm) 0.132 Post Debridement Volume: (cm) Callus: Yes Periwound Skin Texture: Scarring: Yes No Abnormalities Noted Periwound Skin Moisture: No Abnormalities Noted Periwound Skin Color: No Abnormality Temperature:  Debridement Procedures Performed:] [N/A:N/A N/A N/A N/A N/A N/A N/A N/A N/A N/A N/A N/A N/A N/A N/A N/A N/A N/A N/A N/A N/A N/A N/A N/A N/A N/A N/A N/A N/A N/A N/A N/A N/A N/A  N/A] Treatment Notes Electronic Signature(s) Signed: 02/08/2022 10:22:30 AM By: Fredirick Maudlin MD FACS Entered By: Fredirick Maudlin on 02/08/2022 10:22:30 -------------------------------------------------------------------------------- Multi-Disciplinary Care Plan Details Patient Name: Date of Service: CO [name], DA SHA Y S. 02/08/2022 9:30 A M Medical Record Number: [id number] Patient Account Number: [id number] Date of Birth/Sex: Treating RN: 04-Apr-1985 (36 y.o. [name] Primary Care Maelyn Berrey: [name] Other Clinician: Referring Lochlin Eppinger: Treating Tylerjames Hoglund/Extender: Consuelo Pandy in Treatment: 3 Multidisciplinary Care Plan reviewed with physician Active Inactive Nutrition Nursing Diagnoses: Impaired glucose control: actual or potential Potential for alteratiion in Nutrition/Potential for imbalanced nutrition TRYNITY, SKOUSEN ([id number]) [id number] Page 4 of 7 Goals: Patient/caregiver agrees to and verbalizes understanding of need to use nutritional supplements and/or vitamins as prescribed Date Initiated: 01/18/2022 Target Resolution Date: 04/14/2022 Goal Status: Active Patient/caregiver will maintain therapeutic glucose control Date Initiated: 01/18/2022 Target Resolution Date: 04/14/2022 Goal Status: Active Interventions: Assess HgA1c results as ordered upon admission and as needed Assess patient nutrition upon admission and as needed per policy Provide education on elevated blood sugars and impact on wound healing Treatment Activities: Patient referred to Primary Care Physician for further nutritional evaluation : 01/18/2022 Notes: Wound/Skin Impairment Nursing Diagnoses: Impaired tissue integrity Knowledge deficit related to ulceration/compromised skin integrity Goals: Patient/caregiver will verbalize understanding of skin care regimen Date Initiated: 01/18/2022 Target Resolution Date: 04/14/2022 Goal Status:  Active Ulcer/skin breakdown will have a volume reduction of 30% by week 4 Date Initiated: 01/18/2022 Target Resolution Date: 04/14/2022 Goal Status: Active Interventions: Assess patient/caregiver ability to obtain necessary supplies Assess patient/caregiver ability to perform ulcer/skin care regimen upon admission and as needed Assess ulceration(s) every visit Provide education on ulcer and skin care Treatment Activities: Skin care regimen initiated : 01/18/2022 Topical wound management initiated : 01/18/2022 Notes: Electronic Signature(s) Signed: 02/08/2022 11:05:46 AM By: Dellie Catholic RN Entered By: Dellie Catholic on 02/08/2022 11:03:24 -------------------------------------------------------------------------------- Pain Assessment Details Patient Name: Date of Service: [name] Y S. 02/08/2022 9:30 A M Medical Record Number: [id number] Patient Account Number: [id number] Date of Birth/Sex: Treating RN: [birth year] (36 y.o. [name] Primary Care Lashondra Vaquerano: [name] Other Clinician: Referring Rosalene Wardrop: Treating Shone Leventhal/Extender: Consuelo Pandy in Treatment: 3 Active Problems Location of Pain Severity and Description of Pain Patient Has Paino Yes Site Locations Pain Location: CHANELL, NADEAU ([id number]) [id number] Page 5 of  7 Pain Location: Generalized Pain With Dressing Change: Yes Duration of the Pain. Constant / Intermittento Constant Rate the pain. Current Pain Level: 5 Worst Pain Level: 10 Least Pain Level: 3 Tolerable Pain Level: 3 Character of Pain Describe the Pain: Difficult to Pinpoint Pain Management and Medication Current Pain Management: Medication: Yes Cold Application: No Rest: Yes Massage: No Activity: No T.E.N.S.: No Heat Application: No Leg drop or elevation: No Is the Current Pain Management Adequate: Adequate How does your wound impact your activities of daily  livingo Sleep: No Bathing: No Appetite: No Relationship With Others: No Bladder Continence: No Emotions: No Bowel Continence: No Work: No Toileting: No Drive: No Dressing: No Hobbies: No Electronic Signature(s) Signed: 02/08/2022 11:05:46 AM By: Dellie Catholic RN Entered By: Dellie Catholic on 02/08/2022 09:52:09 -------------------------------------------------------------------------------- Patient/Caregiver Education Details Patient Name: Date of Service: [name] 11/27/2023andnbsp9:30 A M Medical Record Number: [id number] Patient Account Number: [id number] Date of Birth/Gender: Treating RN: [birth year] (36 y.o. [name] Primary Care Physician: [name] Other Clinician: Referring Physician: Treating Physician/Extender: Consuelo Pandy in Treatment: 3 Education Assessment Education Provided To: Patient Education Topics Provided Wound/Skin Impairment: Methods: Explain/Verbal Responses: Return demonstration correctly Electronic Signature(s) Signed: 02/08/2022 11:05:46 AM By: Dellie Catholic RN Lacinda Axon, [place] ([id number]) [id number] Page 6 of 7 Entered By: Dellie Catholic on 02/08/2022 11:03:39 -------------------------------------------------------------------------------- Wound Assessment Details Patient Name: Date of Service: [name] 02/08/2022 9:30 A M Medical Record Number: [id number] Patient Account Number: [id number] Date of Birth/Sex: Treating RN: 09-Nov-1985 (36 y.o. [name] Primary Care Makensie Mulhall: [name] Other Clinician: Referring Shunna Mikaelian: Treating Doshia Dalia/Extender: Consuelo Pandy in Treatment: 3 Wound Status Wound Number: 1 Primary Diabetic Wound/Ulcer of the Lower Extremity Etiology: Wound Location: Left, Plantar Foot Wound Open Wounding Event: Gradually Appeared Status: Date Acquired: 11/13/2021 Comorbid Anemia, Hypertension,  Type I Diabetes, End Stage Renal Weeks Of Treatment: 3 History: Disease, Neuropathy, Confinement Anxiety Clustered Wound: No Wound Measurements Length: (cm) 0.7 Width: (cm) 0.8 Depth: (cm) 0.3 Area: (cm) 0.44 Volume: (cm) 0.132 % Reduction in Area: 37.8% % Reduction in Volume: 37.7% Epithelialization: None Tunneling: No Undermining: No Wound Description Classification: Grade 1 Wound Margin: Distinct, outline attached Exudate Amount: Medium Exudate Type: Serosanguineous Exudate Color: red, [name] Foul Odor After Cleansing: No Slough/Fibrino Yes Wound Bed Granulation Amount: Large (67-100%) Exposed Structure Granulation Quality: Red Fascia Exposed: No Necrotic Amount: Small (1-33%) Fat Layer (Subcutaneous Tissue) Exposed: Yes Necrotic Quality: Adherent Slough Tendon Exposed: No Muscle Exposed: No Joint Exposed: No Bone Exposed: No Periwound Skin Texture Texture Color No Abnormalities Noted: No No Abnormalities Noted: Yes Callus: Yes Temperature / Pain Scarring: Yes Temperature: No Abnormality Moisture No Abnormalities Noted: Yes Treatment Notes Wound #1 (Foot) Wound Laterality: Plantar, Left Cleanser Peri-Wound Care Topical Primary Dressing Hydrofera Blue Ready Foam, 4x5 in Discharge Instruction: Apply to wound bed as instructed Secondary Dressing Optifoam Non-Adhesive Dressing, 4x4 in Discharge Instruction: Apply over primary dressing cut to make foam donut JARIAH, TARKOWSKI ([id number]) [id number] Page 7 of 7 Woven Gauze Sponge, Non-Sterile 4x4 in Discharge Instruction: Apply over primary dressing as directed. Woven Gauze Sponges 2x2 in Discharge Instruction: Apply over primary dressing as directed. Secured With [employer] Surgical T 2x10 (in/yd) ape Discharge Instruction: Secure with tape as directed. Conforming Stretch Gauze Bandage Roll, Sterile 4x75 (in/in) Discharge Instruction: Secure with stretch gauze as  directed. Compression Wrap Compression Stockings Add-Ons Electronic Signature(s) Signed: 02/08/2022 11:05:46  AM By: Dellie Catholic RN Entered By: Dellie Catholic on 02/08/2022 09:55:56 -------------------------------------------------------------------------------- Vitals Details Patient Name: Date of Service: CO [name], DA SHA Y S. 02/08/2022 9:30 A M Medical Record Number: [id number] Patient Account Number: [id number] Date of Birth/Sex: Treating RN: [birth year] (36 y.o. [name] Primary Care Penelopi Mikrut: [name] Other Clinician: Referring Taraji Mungo: Treating Roann Merk/Extender: Consuelo Pandy in Treatment: 3 Vital Signs Time Taken: 09:50 Temperature (F): 98.6 Height (in): 66 Pulse (bpm): 93 Weight (lbs): 174 Respiratory Rate (breaths/min): 16 Body Mass Index (BMI): 28.1 Blood Pressure (mmHg): 175/111 Reference Range: 80 - 120 mg / dl Electronic Signature(s) Signed: 02/08/2022 11:05:46 AM By: Dellie Catholic RN Entered By: Dellie Catholic on 02/08/2022 09:59:59

## 2022-02-22 ENCOUNTER — Encounter (HOSPITAL_BASED_OUTPATIENT_CLINIC_OR_DEPARTMENT_OTHER): Payer: Medicare Other | Attending: Internal Medicine | Admitting: Internal Medicine

## 2022-02-22 DIAGNOSIS — E10621 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: Secondary | ICD-10-CM | POA: Diagnosis not present

## 2022-02-22 DIAGNOSIS — N186 End stage renal disease: Secondary | ICD-10-CM | POA: Insufficient documentation

## 2022-02-22 DIAGNOSIS — E1022 Type 1 diabetes mellitus with diabetic chronic kidney disease: Secondary | ICD-10-CM | POA: Diagnosis not present

## 2022-02-22 DIAGNOSIS — E1065 Type 1 diabetes mellitus with hyperglycemia: Secondary | ICD-10-CM | POA: Diagnosis not present

## 2022-02-22 DIAGNOSIS — L97528 Non-pressure chronic ulcer of other part of left foot with other specified severity: Secondary | ICD-10-CM | POA: Insufficient documentation

## 2022-02-22 DIAGNOSIS — L84 Corns and callosities: Secondary | ICD-10-CM | POA: Diagnosis not present

## 2022-02-22 DIAGNOSIS — I120 Hypertensive chronic kidney disease with stage 5 chronic kidney disease or end stage renal disease: Secondary | ICD-10-CM | POA: Insufficient documentation

## 2022-02-22 DIAGNOSIS — L97422 Non-pressure chronic ulcer of left heel and midfoot with fat layer exposed: Secondary | ICD-10-CM | POA: Insufficient documentation

## 2022-02-22 DIAGNOSIS — N2581 Secondary hyperparathyroidism of renal origin: Secondary | ICD-10-CM | POA: Diagnosis not present

## 2022-02-22 DIAGNOSIS — Q970 Karyotype 47, XXX: Secondary | ICD-10-CM | POA: Diagnosis not present

## 2022-02-22 NOTE — Progress Notes (Signed)
KENSINGTON, RIOS ([id number]) [id number] Page 1 of 8 Visit Report for 02/22/2022 Arrival Information Details Patient Name: Date of Service: [name] 02/22/2022 10:15 A M Medical Record Number: [id number] Patient Account Number: [id number] Date of Birth/Sex: Treating RN: 1985/10/30 (36 y.o. F) Primary Care Clara Smolen: [name] Other Clinician: Referring Zarion Oliff: Treating Micheline Markes/Extender: Lenard Galloway in Treatment: 5 Visit Information History Since Last Visit All ordered tests and consults were completed: No Patient Arrived: Ambulatory Added or deleted any medications: No Arrival Time: 10:46 Any new allergies or adverse reactions: No Accompanied By: self Had a fall or experienced change in No Transfer Assistance: None activities of daily living that may affect Patient Identification Verified: Yes risk of falls: Secondary Verification Process Completed: Yes Signs or symptoms of abuse/neglect since last visito No Patient Requires Transmission-Based Precautions: No Hospitalized since last visit: No Patient Has Alerts: No Implantable device outside of the clinic excluding No cellular tissue based products placed in the center since last visit: Pain Present Now: No Electronic Signature(s) Signed: 02/22/2022 11:35:28 AM By: Worthy Rancher Entered By: Worthy Rancher on 02/22/2022 10:47:16 -------------------------------------------------------------------------------- Encounter Discharge Information Details Patient Name: Date of Service: Valerie Audie Pinto Y S. 02/22/2022 10:15 A M Medical Record Number: [id number] Patient Account Number: [id number] Date of Birth/Sex: Treating RN: October 08, 1985 (36 y.o. Harlow Ohms Primary Care Prestyn Stanco: [name] Other Clinician: Referring Gaje Tennyson: Treating Rosaire Cueto/Extender: Lenard Galloway in Treatment: 5 Encounter Discharge Information Items Post  Procedure Vitals Discharge Condition: Stable Temperature (F): 99.1 Ambulatory Status: Ambulatory Pulse (bpm): 93 Discharge Destination: Home Respiratory Rate (breaths/min): 20 Transportation: Private Auto Blood Pressure (mmHg): 101/73 Accompanied By: self Schedule Follow-up Appointment: Yes Clinical Summary of Care: Patient Declined Electronic Signature(s) Unsigned Entered By: Adline Peals on 02/22/2022 11:58:19 Signature(s): [name] ([id number]) [id number] Date(s): [id number] Page 2 of 8 -------------------------------------------------------------------------------- Lower Extremity Assessment Details Patient Name: Date of Service: [name] 02/22/2022 10:15 A M Medical Record Number: [id number] Patient Account Number: [id number] Date of Birth/Sex: Treating RN: November 06, 1985 (36 y.o. F) Primary Care Chala Gul: [name] Other Clinician: Referring Yuvonne Lanahan: Treating Treyshawn Muldrew/Extender: Lenard Galloway in Treatment: 5 Edema Assessment Assessed: Shirlyn Goltz: No] [Right: No] Edema: [Left: N] [Right: o] Calf Left: Right: Point of Measurement: From Medial Instep 36 cm Ankle Left: Right: Point of Measurement: From Medial Instep 20.1 cm Vascular Assessment Pulses: Dorsalis Pedis Palpable: [Left:Yes] Electronic Signature(s) Signed: 02/22/2022 11:35:28 AM By: Worthy Rancher Entered By: Worthy Rancher on 02/22/2022 11:03:05 -------------------------------------------------------------------------------- [place] Details Patient Name: Date of Service: Valerie Audie Pinto Y S. 02/22/2022 10:15 A M Medical Record Number: [id number] Patient Account Number: [id number] Date of Birth/Sex: Treating RN: [birth year] (36 y.o. Harlow Ohms Primary Care Urias Sheek: [name] Other Clinician: Referring Labresha Mellor: Treating Taevion Sikora/Extender: Lenard Galloway in Treatment: Waldo reviewed with physician Active Inactive Nutrition Nursing Diagnoses: Impaired glucose control: actual or potential Potential for alteratiion in Nutrition/Potential for imbalanced nutrition Goals: Patient/caregiver agrees to and verbalizes understanding of need to use nutritional supplements and/or vitamins as prescribed Date Initiated: 01/18/2022 Target Resolution Date: 04/14/2022 Goal Status: Active Patient/caregiver will maintain therapeutic glucose control Date Initiated: 01/18/2022 Target Resolution Date: 04/14/2022 Goal Status: Active JUSTINA, BERTINI ([id number]) [id number] Page 3 of 8 Interventions: Assess HgA1c results as ordered upon admission and as needed Assess patient nutrition upon admission and as needed per policy Provide education on elevated  blood sugars and impact on wound healing Treatment Activities: Patient referred to Primary Care Physician for further nutritional evaluation : 01/18/2022 Notes: Wound/Skin Impairment Nursing Diagnoses: Impaired tissue integrity Knowledge deficit related to ulceration/compromised skin integrity Goals: Patient/caregiver will verbalize understanding of skin care regimen Date Initiated: 01/18/2022 Target Resolution Date: 04/14/2022 Goal Status: Active Ulcer/skin breakdown will have a volume reduction of 30% by week 4 Date Initiated: 01/18/2022 Target Resolution Date: 04/14/2022 Goal Status: Active Interventions: Assess patient/caregiver ability to obtain necessary supplies Assess patient/caregiver ability to perform ulcer/skin care regimen upon admission and as needed Assess ulceration(s) every visit Provide education on ulcer and skin care Treatment Activities: Skin care regimen initiated : 01/18/2022 Topical wound management initiated : 01/18/2022 Notes: Electronic Signature(s) Unsigned Entered By: Adline Peals on 02/22/2022  11:56:35 -------------------------------------------------------------------------------- Pain Assessment Details Patient Name: Date of Service: [name] 02/22/2022 10:15 A M Medical Record Number: [id number] Patient Account Number: [id number] Date of Birth/Sex: Treating RN: 1985/10/21 (36 y.o. F) Primary Care Eleanor Dimichele: [name] Other Clinician: Referring Christapher Gillian: Treating Javiel Canepa/Extender: Lenard Galloway in Treatment: 5 Active Problems Location of Pain Severity and Description of Pain Patient Has Paino No Site Locations Farmer, Sombrillo ([id number]) [id number] Page 4 of 8 Pain Management and Medication Current Pain Management: Electronic Signature(s) Signed: 02/22/2022 11:35:28 AM By: Worthy Rancher Entered By: Worthy Rancher on 02/22/2022 10:48:12 -------------------------------------------------------------------------------- Patient/Caregiver Education Details Patient Name: Date of Service: [name] 12/11/2023andnbsp10:15 A M Medical Record Number: [id number] Patient Account Number: [id number] Date of Birth/Gender: Treating RN: 1985/07/10 (36 y.o. Harlow Ohms Primary Care Physician: [name] Other Clinician: Referring Physician: Treating Physician/Extender: Lenard Galloway in Treatment: 5 Education Assessment Education Provided To: Patient Education Topics Provided Wound/Skin Impairment: Methods: Explain/Verbal Responses: Reinforcements needed, State content correctly Electronic Signature(s) Unsigned Entered By: Adline Peals on 02/22/2022 11:56:47 -------------------------------------------------------------------------------- Wound Assessment Details Patient Name: Date of Service: [name] 02/22/2022 10:15 A M Medical Record Number: [id number] Patient Account Number: [id number] Date of Birth/Sex: Treating RN: 1985-04-20 (36 y.o. 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 History: Disease, Neuropathy, Confinement Anxiety Clustered Wound: No Photos Wound Measurements Length: (cm) 0.4 Width: (cm) 0.4 Depth: (cm) 0.4 Area: (cm) 0.126 Volume: (cm) 0.05 % Reduction in Area: 82.2% % Reduction in Volume: 76.4% Epithelialization: None Tunneling: No Undermining: No Wound Description Classification: Grade 1 Wound Margin: Distinct, outline attached Exudate Amount: Medium Exudate Type: Serosanguineous Exudate Color: red, brown Foul Odor After Cleansing: No Slough/Fibrino Yes Wound Bed Granulation Amount: Large (67-100%) Exposed Structure Granulation Quality: Red Fascia Exposed: No Necrotic Amount: Small (1-33%) Fat Layer (Subcutaneous Tissue) Exposed: Yes Tendon Exposed: No Muscle Exposed: No Joint Exposed: No Bone Exposed: No Periwound Skin Texture Texture Color No Abnormalities Noted: No No Abnormalities Noted: Yes Callus: Yes Temperature / Pain Scarring: Yes Temperature: No Abnormality Moisture No Abnormalities Noted: Yes Treatment Notes Wound #1 (Foot) Wound Laterality: Plantar, Left Cleanser Soap and Water Discharge Instruction: May shower and wash wound with dial antibacterial soap and water prior to dressing change. Wound Cleanser Discharge  Instruction: Cleanse the wound with wound cleanser prior to applying a clean dressing using gauze sponges, not tissue or cotton balls. Peri-Wound  Care Topical Primary Dressing STEPHANE, JUNKINS ([id number]) [id number] Page 6 of 8 Hydrofera Blue Ready Foam, 4x5 in Discharge Instruction: Apply to wound bed as instructed Secondary Dressing Optifoam Non-Adhesive Dressing, 4x4 in Discharge Instruction: Apply over primary dressing cut to make foam donut Woven Gauze Sponge, Non-Sterile 4x4 in Discharge Instruction: Apply over primary dressing as directed. Woven Gauze Sponges 2x2 in Discharge Instruction: Apply over primary dressing as directed. Secured With Conforming Stretch Gauze Bandage Roll, Sterile 4x75 (in/in) Discharge Instruction: Secure with stretch gauze as directed. 97M Medipore Soft Cloth Surgical T 2x10 (in/yd) ape Discharge Instruction: Secure with tape as directed. Compression Wrap Compression Stockings Add-Ons Electronic Signature(s) Unsigned Previous Signature: 02/22/2022 11:35:28 AM Version By: Worthy Rancher Entered By: Adline Peals on 02/22/2022 11:53:39 -------------------------------------------------------------------------------- Wound Assessment Details Patient Name: Date of Service: [name] 02/22/2022 10:15 A M Medical Record Number: [id number] Patient Account Number: [id number] Date of Birth/Sex: Treating RN: 12-24-1985 (36 y.o. F) Primary Care Lucciano Vitali: [name] Other Clinician: Referring Beola Vasallo: Treating Siearra Amberg/Extender: Lenard Galloway in Treatment: 5 Wound Status Wound Number: 2 Primary Abrasion Etiology: Wound Location: Plantar T Great oe Wound Open Wounding Event: Laceration Status: Date Acquired: 02/10/2022 Comorbid Anemia, Hypertension, Type I Diabetes, End Stage Renal Weeks Of Treatment: 0 History: Disease, Neuropathy, Confinement Anxiety Clustered Wound: No Photos Wound Measurements Length: (cm) 0.5 Width: (cm) 0.3 Depth: (cm) 0.1 Area: (cm) 0.118 UDELL, BLASINGAME ([id number]) Volume: (cm) 0.012 % Reduction  in Area: % Reduction in Volume: Epithelialization: None Tunneling: No [id number] Page 7 of 8 Undermining: No Wound Description Classification: Full Thickness Without Exposed Support Structures Wound Margin: Distinct, outline attached Exudate Amount: Small Exudate Type: Serosanguineous Exudate Color: red, brown Foul Odor After Cleansing: No Slough/Fibrino Yes Wound Bed Granulation Amount: Large (67-100%) Exposed Structure Granulation Quality: Red Fascia Exposed: No Necrotic Amount: Small (1-33%) Fat Layer (Subcutaneous Tissue) Exposed: Yes Necrotic Quality: Adherent Slough Tendon Exposed: No Muscle Exposed: No Joint Exposed: No Bone Exposed: No Periwound Skin Texture Texture Color No Abnormalities Noted: No No Abnormalities Noted: Yes Callus: Yes Temperature / Pain Temperature: No Abnormality Moisture No Abnormalities Noted: Yes Treatment Notes Wound #2 (Toe Great) Wound Laterality: Plantar Cleanser Soap and Water Discharge Instruction: May shower and wash wound with dial antibacterial soap and water prior to dressing change. Wound Cleanser Discharge Instruction: Cleanse the wound with wound cleanser prior to applying a clean dressing using gauze sponges, not tissue or cotton balls. Peri-Wound Care Topical Primary Dressing Hydrofera Blue Ready Transfer Foam, 2.5x2.5 (in/in) Discharge Instruction: Apply directly to wound bed as directed Secondary Dressing Optifoam Non-Adhesive Dressing, 4x4 in Discharge Instruction: Apply over primary dressing cut to make foam donut Woven Gauze Sponges 2x2 in Discharge Instruction: Apply over primary dressing as directed. Secured With Conforming Stretch Gauze Bandage Roll, Sterile 4x75 (in/in) Discharge Instruction: Secure with stretch gauze as directed. 97M Medipore Soft Cloth Surgical T 2x10 (in/yd) ape Discharge Instruction: Secure with tape as directed. Compression Wrap Compression  Stockings Add-Ons Electronic Signature(s) Signed: 02/22/2022 11:35:28 AM By: Worthy Rancher Entered By: Worthy Rancher on 02/22/2022 11:03:17 [name] ([id number]) [id number] Page 8 of 8 -------------------------------------------------------------------------------- Vitals Details Patient Name: Date of Service: [name] 02/22/2022 10:15 A M Medical Record Number: [id number] Patient Account Number: [id number] Date of Birth/Sex: Treating RN: Sep 10, 1985 (36 y.o. F) Primary  Care Swannie Milius: [name] Other Clinician: Referring Ray Gervasi: Treating Sydnei Ohaver/Extender: Lenard Galloway in Treatment: 5 Vital Signs Time Taken: 10:45 Temperature (F): 99.1 Height (in): 66 Pulse (bpm): 93 Weight (lbs): 174 Respiratory Rate (breaths/min): 20 Body Mass Index (BMI): 28.1 Blood Pressure (mmHg): 101/73 Capillary Blood Glucose (mg/dl): 253 Reference Range: 80 - 120 mg / dl Electronic Signature(s) Signed: 02/22/2022 11:35:28 AM By: Worthy Rancher Entered By: Worthy Rancher on 02/22/2022 10:47:58
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
## 2022-03-10 ENCOUNTER — Encounter (HOSPITAL_BASED_OUTPATIENT_CLINIC_OR_DEPARTMENT_OTHER): Payer: Medicare Other | Admitting: General Surgery

## 2022-03-10 DIAGNOSIS — E10621 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: Secondary | ICD-10-CM | POA: Diagnosis not present

## 2022-03-10 NOTE — Progress Notes (Signed)
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 Non-Viable tissue/material. Material removed includes Callus after achieving pain control using Lidocaine 4% Topical Solution. No specimens were taken. A time out was conducted at 11:50, prior to the start of the procedure. A Minimum amount of bleeding was controlled with Pressure. The procedure was tolerated well with a pain level of 0 throughout and a pain level of 0 following the procedure. Post Debridement Measurements: 0.3cm length x 0.3cm width x 0.1cm depth; 0.007cm^3 volume. Character of Wound/Ulcer Post Debridement is improved. Post procedure Diagnosis Wound #2: Same as Pre-Procedure General Notes: Scribed for Dr. Celine Ahr by J.Scotton. Plan Follow-up Appointments: Return Appointment in 2 weeks. - Dr. Celine Ahr Room 3 Anesthetic: Wound #1 Left,Plantar Foot: (In clinic) Topical Lidocaine 5% applied to wound bed Bathing/ Shower/ Hygiene: May shower and wash wound with soap and  water. Off-Loading: Other: - try to minimize walking. Continue using the Knee Scooter! Additional Orders / Instructions: Follow Nutritious Diet - Increase Protein Intake. Please try and get 60-100g per day WOUND #1: - Foot Wound Laterality: Plantar, Left Cleanser: Soap and Water 1 x Per Day/30 Days Discharge Instructions: May shower and wash wound with dial antibacterial soap and water prior to dressing change. Cleanser: Wound Cleanser 1 x Per Day/30 Days Discharge Instructions: Cleanse the wound with wound cleanser prior to applying a clean dressing using gauze sponges, not tissue or cotton balls. Prim Dressing: Hydrofera Blue Ready Foam, 4x5 in (DME) (Generic) 1 x Per Day/30 Days ary Discharge Instructions: Apply to wound bed as instructed Secondary Dressing: Optifoam Non-Adhesive Dressing, 4x4 in (DME) (Generic) 1 x Per Day/30 Days Discharge Instructions: Apply over primary dressing cut to make foam donut Secondary Dressing: Woven Gauze Sponges 2x2 in 1 x Per Day/30 Days Discharge Instructions: Apply over primary dressing as directed. Secondary Dressing: Zetuvit Plus 4x8 in (DME) (Generic) 1 x Per Day/30 Days Discharge Instructions: Apply over primary dressing as directed. Secured With: [employer], 4.5x3.1 (in/yd) (DME) (Generic) 1 x Per Day/30 Days Discharge Instructions: Secure with Kerlix as directed. Secured With: 47M [employer] Public affairs consultant Surgical T 2x10 (in/yd) (DME) (Generic) 1 x Per Day/30 Days ape Discharge Instructions: Secure with tape as directed. WOUND #2: - [place] Ramus Wound Laterality: Plantar, Left [name], [name] ([id number]) [id number] Page 9 of 11 Cleanser: Soap and Water 1 x Per Day/30 Days Discharge Instructions: May shower and wash wound with dial antibacterial soap and water prior to dressing change. Cleanser: Wound Cleanser 1 x Per Day/30 Days Discharge Instructions: Cleanse the wound with wound cleanser prior to applying a  clean dressing using gauze sponges, not tissue or cotton balls. Prim Dressing: Hydrofera Blue Ready Foam, 4x5 in (DME) (Generic) 1 x Per Day/30 Days ary Discharge Instructions: Apply to wound bed as instructed Secondary Dressing: Optifoam Non-Adhesive Dressing, 4x4 in (DME) (Generic) 1 x Per Day/30 Days Discharge Instructions: Apply over primary dressing cut to make foam donut Secondary Dressing: Woven Gauze Sponge, Non-Sterile 4x4 in (DME) (Generic) 1 x Per Day/30 Days Discharge Instructions: Apply over primary dressing as directed. Secondary Dressing: Woven Gauze Sponges 2x2 in (DME) (Generic) 1 x Per Day/30 Days Discharge Instructions: Apply over primary dressing as directed. Secured With: [employer], 4.5x3.1 (in/yd) (DME) (Generic) 1 x Per Day/30 Days Discharge Instructions: Secure with Kerlix as directed. Secured With: [employer]M [employer] Public affairs consultant Surgical T 2x10 (in/yd) (DME) (Generic) 1 x Per Day/30 Days ape Discharge Instructions: Secure with tape as directed. WOUND #3: - T Great Wound Laterality: Left, Medial oe  Cleanser: Soap and Water 1 x Per Day/30 Days Discharge Instructions: May shower and wash wound with dial antibacterial soap and water prior to dressing change. Cleanser: Wound Cleanser 1 x Per Day/30 Days Discharge Instructions: Cleanse the wound with wound cleanser prior to applying a clean dressing using gauze sponges, not tissue or cotton balls. Prim Dressing: Hydrofera Blue Ready Foam, 4x5 in (DME) (Generic) 1 x Per Day/30 Days ary Discharge Instructions: Apply to wound bed as instructed Secondary Dressing: Optifoam Non-Adhesive Dressing, 4x4 in (DME) (Generic) 1 x Per Day/30 Days Discharge Instructions: Apply over primary dressing cut to make foam donut Secondary Dressing: Woven Gauze Sponge, Non-Sterile 4x4 in (DME) (Generic) 1 x Per Day/30 Days Discharge Instructions: Apply over primary dressing as directed. Secondary Dressing: Woven Gauze Sponges 2x2 in (DME)  (Generic) 1 x Per Day/30 Days Discharge Instructions: Apply over primary dressing as directed. Secured With: [employer], 4.5x3.1 (in/yd) (DME) (Generic) 1 x Per Day/30 Days Discharge Instructions: Secure with Kerlix as directed. Secured With: [employer]M [employer] Public affairs consultant Surgical T 2x10 (in/yd) (DME) (Generic) 1 x Per Day/30 Days ape Discharge Instructions: Secure with tape as directed. 03/10/2022: The wound on her plantar left first toe has a hard layer of callus overlying it. There are areas of tissue discoloration suggesting that she has been on her feet substantially and not offloading at all. She also pulled some skin off of her toe this morning creating a new small ulcer near the medial aspect of the great toe. The wound on her midfoot has a lot of wet perimeter callus. No concern for infection. I used a curette to debride callus, slough, skin, and nonviable subcutaneous tissue from her midfoot ulcer. This saucerized it so that she should have an easier time changing her dressings. I also debrided the callus off of her great toe wound. No debridement was necessary for her new wound. Will continue Hydrofera Blue to all sites. I broached the topic of a total contact cast with her again today, but she was fairly noncommittal about whether or not she would consider this. Follow-up in 2 weeks. Electronic Signature(s) Signed: 03/10/2022 12:12:12 PM By: Fredirick Maudlin MD FACS Entered By: Fredirick Maudlin on 03/10/2022 12:12:11 -------------------------------------------------------------------------------- HxROS Details Patient Name: Date of Service: [name], Valerie SHA Y S. 03/10/2022 11:00 A M Medical Record Number: [id number] Patient Account Number: [id number] Date of Birth/Sex: Treating RN: 1985/04/18 (36 y.o. F) Primary Care Provider: [name] Other Clinician: Referring Provider: Treating Provider/Extender: Consuelo Pandy in Treatment: 7 Information  Obtained From Patient Chart Eyes Medical History: Negative for: Cataracts; Glaucoma; Optic Neuritis Ear/Nose/Mouth/Throat Medical History: Negative for: Chronic sinus problems/congestion; Middle ear problems [name], [name] ([id number]) [id number] Page 10 of 11 Hematologic/Lymphatic Medical History: Positive for: Anemia Cardiovascular Medical History: Positive for: Hypertension Gastrointestinal Medical History: Past Medical History Notes: hiatal hernis, GERD, esophageal stricture, hx GI bleed, liver lesion Endocrine Medical History: Positive for: Type I Diabetes Negative for: Type II Diabetes Time with diabetes: since 2022 Treated with: Insulin Blood sugar tested every day: Yes Tested : has dexcom Genitourinary Medical History: Positive for: End Stage Renal Disease - HD Past Medical History Notes: hx failed kidney transplant Integumentary (Skin) Medical History: Negative for: History of Burn Neurologic Medical History: Positive for: Neuropathy Past Medical History Notes: pseudoseizures Oncologic Medical History: Negative for: Received Chemotherapy; Received Radiation Psychiatric Medical History: Positive for: Confinement Anxiety Negative for: Anorexia/bulimia Immunizations Pneumococcal Vaccine: Received Pneumococcal Vaccination: No Implantable Devices Yes Hospitalization / Surgery  History Type of Hospitalization/Surgery left arm AV fistula mult EGDs with balloon dilitation cholecystectomy kidney transplant parathyroidectomy Family and Social History Cancer: No; Diabetes: Yes - Father; Heart Disease: Yes - Father; Hereditary Spherocytosis: No; Hypertension: Yes - Mother; Kidney Disease: No; Lung Disease: No; Seizures: No; Stroke: Yes - Mother; Thyroid Problems: No; Tuberculosis: No; Never smoker; Marital Status - Single; Alcohol Use: Never; Drug Use: No History; Caffeine Use: Never; Financial Concerns: No; Food, Clothing or  Shelter Needs: No; Support System Lacking: No; Transportation Concerns: No Electronic Signature(s) CHRISHONDA, HESCH ([id number]) [id number] Page 11 of 11 Signed: 03/10/2022 12:13:53 PM By: Fredirick Maudlin MD FACS Entered By: Fredirick Maudlin on 03/10/2022 12:08:44 -------------------------------------------------------------------------------- SuperBill Details Patient Name: Date of Service: [name], Valerie SHA Y S. 03/10/2022 Medical Record Number: [id number] Patient Account Number: [id number] Date of Birth/Sex: Treating RN: Jul 20, 1985 (36 y.o. 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, XXX E10.65 Type 1 diabetes mellitus with hyperglycemia E10.621 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 Facility Procedures : CPT4 Code: 33354562 Description: 56389 - DEB SUBQ TISSUE 20 SQ CM/< ICD-10 Diagnosis Description L97.422 Non-pressure chronic ulcer of left heel and midfoot with fat layer exposed Modifier: Quantity: 1 : CPT4 Code: [id number] Description: 81157 - DEBRIDE WOUND 1ST 20 SQ CM OR < ICD-10 Diagnosis Description L97.528 Non-pressure chronic ulcer of other part of left foot with other specified severi Modifier: ty Quantity: 1 Physician Procedures : CPT4 Code Description Modifier [id number] 97416 - WC PHYS LEVEL 3 - EST PT 25 ICD-10 Diagnosis Description L97.422 Non-pressure chronic ulcer of left heel and midfoot with fat layer exposed L97.528 Non-pressure chronic ulcer of other part of left foot  with other specified severity  N18.6 End stage renal disease E10.621 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 Quantity: 1 : [id number] 11042 - WC PHYS SUBQ TISS 20 SQ CM ICD-10 Diagnosis Description L97.422 Non-pressure chronic ulcer of left heel and midfoot with fat layer exposed Quantity: 1 : [id number] 24825 - WC PHYS DEBR WO ANESTH 20 SQ CM ICD-10 Diagnosis Description L97.528 Non-pressure chronic ulcer of other part of left foot with other specified severity Quantity: 1 Electronic Signature(s) Signed: 03/10/2022 12:12:40 PM By: Fredirick Maudlin MD FACS Entered By: Fredirick Maudlin on 03/10/2022 12:12:39

## 2022-03-10 NOTE — Progress Notes (Signed)
TRENA, DUNAVAN ([id number]) [id number] Page 1 of 11 Visit Report for 03/10/2022 Arrival Information Details Patient Name: Date of Service: [name] 03/10/2022 11:00 A M Medical Record Number: [id number] Patient Account Number: [id number] Date of Birth/Sex: Treating RN: 11-Dec-1985 (36 y.o. [name] Primary Care Shajuan Musso: [name] Other Clinician: Referring Amberlie Gaillard: Treating Lorenia Hoston/Extender: Consuelo Pandy in Treatment: 7 Visit Information History Since Last Visit Added or deleted any medications: No Patient Arrived: Ambulatory Any new allergies or adverse reactions: No Arrival Time: 11:21 Had a fall or experienced change in No Accompanied By: self activities of daily living that may affect Transfer Assistance: None risk of falls: Patient Identification Verified: Yes Signs or symptoms of abuse/neglect since last visito No Patient Requires Transmission-Based Precautions: No Hospitalized since last visit: No Patient Has Alerts: No Implantable device outside of the clinic excluding No cellular tissue based products placed in the center since last visit: Has Dressing in Place as Prescribed: Yes Pain Present Now: No Electronic Signature(s) Signed: 03/10/2022 5:27:24 PM By: Dellie Catholic RN Entered By: Dellie Catholic on 03/10/2022 11:28:57 -------------------------------------------------------------------------------- Encounter Discharge Information Details Patient Name: Date of Service: [name], Valerie SHA Y S. 03/10/2022 11:00 A M Medical Record Number: [id number] Patient Account Number: [id number] Date of Birth/Sex: Treating RN: 26-Jan-1986 (36 y.o. [name] Primary Care Odalys Win: [name] Other Clinician: Referring Abbegayle Denault: Treating Shemicka Cohrs/Extender: Consuelo Pandy in Treatment: 7 Encounter Discharge Information Items Post Procedure Vitals Discharge Condition:  Stable Temperature (F): 98.5 Ambulatory Status: Ambulatory Pulse (bpm): 89 Discharge Destination: Home Respiratory Rate (breaths/min): 16 Transportation: Private Auto Blood Pressure (mmHg): 128/89 Accompanied By: self Schedule Follow-up Appointment: Yes Clinical Summary of Care: Patient Declined Electronic Signature(s) Signed: 03/10/2022 5:27:24 PM By: Dellie Catholic RN Entered By: Dellie Catholic on 03/10/2022 13:28:44 [name] ([id number]) [id number] Page 2 of 11 -------------------------------------------------------------------------------- Lower Extremity Assessment Details Patient Name: Date of Service: [name] 03/10/2022 11:00 A M Medical Record Number: [id number] Patient Account Number: [id number] Date of Birth/Sex: Treating RN: 19-Jun-1985 (36 y.o. [name] Primary Care Conya Ellinwood: [name] Other Clinician: Referring Eligah Anello: Treating Marieme Mcmackin/Extender: Consuelo Pandy in Treatment: 7 Edema Assessment Assessed: [name]: No] [Right: No] Edema: [Left: N] [Right: o] Calf Left: Right: Point of Measurement: From Medial Instep 36.1 cm Ankle Left: Right: Point of Measurement: From Medial Instep 21 cm Vascular Assessment Pulses: Dorsalis Pedis Palpable: [Left:Yes] Electronic Signature(s) Signed: 03/10/2022 5:27:24 PM By: Dellie Catholic RN Entered By: Dellie Catholic on 03/10/2022 11:36:28 -------------------------------------------------------------------------------- Multi Wound Chart Details Patient Name: Date of Service: [name], Valerie SHA Y S. 03/10/2022 11:00 A M Medical Record Number: [id number] Patient Account Number: [id number] Date of Birth/Sex: Treating RN: 02-Apr-1985 (36 y.o. F) Primary Care Queena Monrreal: [name] Other Clinician: Referring Alaa Mullally: Treating Reizel Calzada/Extender: Consuelo Pandy in Treatment: 7 Vital Signs Height(in): 66 Pulse(bpm):  89 Weight(lbs): 174 Blood Pressure(mmHg): 128/89 Body Mass Index(BMI): 28.1 Temperature(F): 98.5 Respiratory Rate(breaths/min): 16 [1:Photos:] [3:[id number] Page 3 of 11] Left, Plantar Foot Left, Plantar T Great oe Left, Medial T Great oe Wound Location: Gradually Appeared Laceration Trauma Wounding Event: Diabetic Wound/Ulcer of the Lower Abrasion Lesion Primary Etiology: Extremity Anemia, Hypertension, Type I Anemia, Hypertension, Type I Anemia, Hypertension, Type I Comorbid History: Diabetes, End Stage Renal Disease, Diabetes, End Stage Renal Disease, Diabetes, End Stage Renal Disease, Neuropathy, Confinement Anxiety Neuropathy, Confinement Anxiety Neuropathy, Confinement Anxiety 11/13/2021 02/10/2022 03/10/2022 Date Acquired:  7 2 0 Weeks of Treatment: Open Open Open Wound Status: No No No Wound Recurrence: 0.7x0.5x0.3 0.3x0.3x0.1 0.5x0.3x0.1 Measurements L x W x D (cm) 0.275 0.071 0.118 A (cm) : rea 0.082 0.007 0.012 Volume (cm) : 61.10% 39.80% N/A % Reduction in A rea: 61.30% 41.70% N/A % Reduction in Volume: 7 Starting Position 1 (o'clock): 12 Ending Position 1 (o'clock): 0.3 Maximum Distance 1 (cm): Yes No No Undermining: Grade 1 Full Thickness Without Exposed Full Thickness Without Exposed Classification: Support Structures Support Structures Medium Small Medium Exudate A mount: Serosanguineous Serosanguineous Serosanguineous Exudate Type: red, brown red, brown red, brown Exudate Color: Distinct, outline attached Distinct, outline attached N/A Wound Margin: Large (67-100%) Large (67-100%) Medium (34-66%) Granulation A mount: Red Red Red Granulation Quality: Small (1-33%) Small (1-33%) Medium (34-66%) Necrotic A mount: Eschar, Adherent Slough Adherent [facility], Adherent Slough Necrotic Tissue: Fat Layer (Subcutaneous Tissue): Yes Fat Layer (Subcutaneous Tissue): Yes Fat Layer (Subcutaneous Tissue): Yes Exposed  Structures: Fascia: No Fascia: No Fascia: No Tendon: No Tendon: No Tendon: No Muscle: No Muscle: No Muscle: No Joint: No Joint: No Joint: No Bone: No Bone: No Bone: No None None Small (1-33%) Epithelialization: Debridement - Excisional Debridement - Selective/Open Wound N/A Debridement: Pre-procedure Verification/Time Out 11:50 11:50 N/A Taken: Lidocaine 4% T opical Solution Lidocaine 4% Topical Solution N/A Pain Control: Other, Callus, Subcutaneous, Slough Callus N/A Tissue Debrided: Skin/Subcutaneous Tissue Non-Viable Tissue N/A Level: 0.35 0.09 N/A Debridement A (sq cm): rea Curette Curette N/A Instrument: Minimum Minimum N/A Bleeding: Pressure Pressure N/A Hemostasis A chieved: 0 0 N/A Procedural Pain: 0 0 N/A Post Procedural Pain: Procedure was tolerated well Procedure was tolerated well N/A Debridement Treatment Response: 0.7x0.5x0.3 0.3x0.3x0.1 N/A Post Debridement Measurements L x W x D (cm) 0.082 0.007 N/A Post Debridement Volume: (cm) Callus: Yes Callus: Yes Excoriation: No Periwound Skin Texture: Scarring: Yes Induration: No Callus: No Crepitus: No Rash: No Scarring: No No Abnormalities Noted No Abnormalities Noted Maceration: No Periwound Skin Moisture: Dry/Scaly: No No Abnormalities Noted No Abnormalities Noted Atrophie Blanche: No Periwound Skin Color: Cyanosis: No Ecchymosis: No Erythema: No Hemosiderin Staining: No Mottled: No Pallor: No Rubor: No No Abnormality No Abnormality No Abnormality Temperature: Debridement Debridement N/A Procedures Performed: Treatment Notes Electronic Signature(s) Signed: 03/10/2022 12:01:32 PM By: Fredirick Maudlin MD FACS Entered By: Fredirick Maudlin on 03/10/2022 12:01:32 [name] ([id number]) [id number] Page 4 of 11 -------------------------------------------------------------------------------- Multi-Disciplinary Care Plan Details Patient Name: Date of  Service: [name] 03/10/2022 11:00 A M Medical Record Number: [id number] Patient Account Number: [id number] Date of Birth/Sex: Treating RN: Oct 04, 1985 (36 y.o. [name] Primary Care Letcher Schweikert: [name] Other Clinician: Referring Ryli Standlee: Treating Rishit Burkhalter/Extender: Consuelo Pandy in Treatment: 7 Multidisciplinary Care Plan reviewed with physician Active Inactive Nutrition Nursing Diagnoses: Impaired glucose control: actual or potential Potential for alteratiion in Nutrition/Potential for imbalanced nutrition Goals: Patient/caregiver agrees to and verbalizes understanding of need to use nutritional supplements and/or vitamins as prescribed Date Initiated: 01/18/2022 Target Resolution Date: 04/14/2022 Goal Status: Active Patient/caregiver will maintain therapeutic glucose control Date Initiated: 01/18/2022 Target Resolution Date: 04/14/2022 Goal Status: Active Interventions: Assess HgA1c results as ordered upon admission and as needed Assess patient nutrition upon admission and as needed per policy Provide education on elevated blood sugars and impact on wound healing Treatment Activities: Patient referred to Primary Care Physician for further nutritional evaluation : 01/18/2022 Notes: Wound/Skin Impairment Nursing Diagnoses: Impaired tissue integrity Knowledge deficit related to ulceration/compromised skin integrity Goals:  Patient/caregiver will verbalize understanding of skin care regimen Date Initiated: 01/18/2022 Target Resolution Date: 04/14/2022 Goal Status: Active Ulcer/skin breakdown will have a volume reduction of 30% by week 4 Date Initiated: 01/18/2022 Target Resolution Date: 04/14/2022 Goal Status: Active Interventions: Assess patient/caregiver ability to obtain necessary supplies Assess patient/caregiver ability to perform ulcer/skin care regimen upon admission and as needed Assess ulceration(s) every visit Provide  education on ulcer and skin care Treatment Activities: Skin care regimen initiated : 01/18/2022 Topical wound management initiated : 01/18/2022 Notes: Electronic Signature(s) Signed: 03/10/2022 5:27:24 PM By: Dellie Catholic RN Entered By: Dellie Catholic on 03/10/2022 13:26:41 [name] ([id number]) [id number] Page 5 of 11 -------------------------------------------------------------------------------- Pain Assessment Details Patient Name: Date of Service: [name] 03/10/2022 11:00 A M Medical Record Number: [id number] Patient Account Number: [id number] Date of Birth/Sex: Treating RN: [birth year] (36 y.o. [name] Primary Care Idrees Quam: [name] Other Clinician: Referring Rithika Seel: Treating Danell Verno/Extender: Consuelo Pandy in Treatment: 7 Active Problems Location of Pain Severity and Description of Pain Patient Has Paino No Site Locations Pain Management and Medication Current Pain Management: Electronic Signature(s) Signed: 03/10/2022 5:27:24 PM By: Dellie Catholic RN Entered By: Dellie Catholic on 03/10/2022 11:34:05 -------------------------------------------------------------------------------- Patient/Caregiver Education Details Patient Name: Date of Service: [name] 12/27/2023andnbsp11:00 A M Medical Record Number: [id number] Patient Account Number: [id number] Date of Birth/Gender: Treating RN: 1985/06/07 (36 y.o. [name] Primary Care Physician: [name] Other Clinician: Referring Physician: Treating Physician/Extender: Consuelo Pandy in Treatment: 7 Education Assessment Education Provided To: Patient Education Topics Provided Wound/Skin Impairment: Methods: Explain/Verbal Responses: Return demonstration correctly [name], [name] ([id number]) [id number] Page 6 of 11 Electronic Signature(s) Signed: 03/10/2022  5:27:24 PM By: Dellie Catholic RN Entered By: Dellie Catholic on 03/10/2022 13:26:53 -------------------------------------------------------------------------------- Wound Assessment Details Patient Name: Date of Service: Valerie Piano Y S. 03/10/2022 11:00 A M Medical Record Number: [id number] Patient Account Number: [id number] Date of Birth/Sex: Treating RN: [birth year] (36 y.o. [name] Primary Care Adalia Pettis: [name] Other Clinician: Referring Earma Nicolaou: Treating Yoshi Vicencio/Extender: Consuelo Pandy in Treatment: 7 Wound Status Wound Number: 1 Primary Diabetic Wound/Ulcer of the Lower Extremity Etiology: Wound Location: Left, Plantar Foot Wound Open Wounding Event: Gradually Appeared Status: Date Acquired: 11/13/2021 Comorbid Anemia, Hypertension, Type I Diabetes, End Stage Renal Weeks Of Treatment: 7 History: Disease, Neuropathy, Confinement Anxiety Clustered Wound: No Photos Wound Measurements Length: (cm) 0.7 Width: (cm) 0.5 Depth: (cm) 0.3 Area: (cm) 0.275 Volume: (cm) 0.082 % Reduction in Area: 61.1% % Reduction in Volume: 61.3% Epithelialization: None Tunneling: No Undermining: Yes Starting Position (o'clock): 7 Ending Position (o'clock): 12 Maximum Distance: (cm) 0.3 Wound Description Classification: Grade 1 Wound Margin: Distinct, outline attached Exudate Amount: Medium Exudate Type: Serosanguineous Exudate Color: red, brown Foul Odor After Cleansing: No Slough/Fibrino Yes Wound Bed Granulation Amount: Large (67-100%) Exposed Structure Granulation Quality: Red Fascia Exposed: No Necrotic Amount: Small (1-33%) Fat Layer (Subcutaneous Tissue) Exposed: Yes Necrotic Quality: Eschar, Adherent Slough Tendon Exposed: No Muscle Exposed: No Joint Exposed: No Bone Exposed: No [address] TIKIA, SKILTON ([id number]) [id number] Page 7 of 11 No Abnormalities Noted: No No  Abnormalities Noted: Yes Callus: Yes Temperature / Pain Scarring: Yes Temperature: No Abnormality Moisture No Abnormalities Noted: Yes Treatment Notes Wound #1 (Foot) Wound Laterality: Plantar, Left Cleanser Soap and Water Discharge Instruction: May shower and wash wound with dial antibacterial soap and water prior to  dressing change. Wound Cleanser Discharge Instruction: Cleanse the wound with wound cleanser prior to applying a clean dressing using gauze sponges, not tissue or cotton balls. Peri-Wound Care Topical Primary Dressing Hydrofera Blue Ready Foam, 4x5 in Discharge Instruction: Apply to wound bed as instructed Secondary Dressing Optifoam Non-Adhesive Dressing, 4x4 in Discharge Instruction: Apply over primary dressing cut to make foam donut Woven Gauze Sponges 2x2 in Discharge Instruction: Apply over primary dressing as directed. Zetuvit Plus 4x8 in Discharge Instruction: Apply over primary dressing as directed. Secured With [employer], 4.5x3.1 (in/yd) Discharge Instruction: Secure with Kerlix as directed. 65M Medipore Soft Cloth Surgical T 2x10 (in/yd) ape Discharge Instruction: Secure with tape as directed. Compression Wrap Compression Stockings Add-Ons Electronic Signature(s) Signed: 03/10/2022 5:27:24 PM By: Dellie Catholic RN Entered By: Dellie Catholic on 03/10/2022 11:44:01 -------------------------------------------------------------------------------- Wound Assessment Details Patient Name: Date of Service: Valerie Piano Y S. 03/10/2022 11:00 A M Medical Record Number: [id number] Patient Account Number: [id number] Date of Birth/Sex: Treating RN: [birth year] (36 y.o. [name] Primary Care Elian Gloster: [name] Other Clinician: Referring Alexas Basulto: Treating Elleana Stillson/Extender: Consuelo Pandy in Treatment: 7 Wound Status Wound Number: 2 Primary Abrasion Etiology: Wound Location: Left, Plantar T Great oe Wound  Open Wounding Event: Laceration Status: Date Acquired: 02/10/2022 Comorbid Anemia, Hypertension, Type I Diabetes, End Stage Renal Weeks Of Treatment: 2 History: Disease, Neuropathy, Confinement Anxiety Clustered Wound: No Photos [name], [name] ([id number]) [id number] Page 8 of 11 Wound Measurements Length: (cm) 0.3 Width: (cm) 0.3 Depth: (cm) 0.1 Area: (cm) 0.071 Volume: (cm) 0.007 % Reduction in Area: 39.8% % Reduction in Volume: 41.7% Epithelialization: None Tunneling: No Undermining: No Wound Description Classification: Full Thickness Without Exposed Support Structures Wound Margin: Distinct, outline attached Exudate Amount: Small Exudate Type: Serosanguineous Exudate Color: red, brown Foul Odor After Cleansing: No Slough/Fibrino Yes Wound Bed Granulation Amount: Large (67-100%) Exposed Structure Granulation Quality: Red Fascia Exposed: No Necrotic Amount: Small (1-33%) Fat Layer (Subcutaneous Tissue) Exposed: Yes Necrotic Quality: Adherent Slough Tendon Exposed: No Muscle Exposed: No Joint Exposed: No Bone Exposed: No Periwound Skin Texture Texture Color No Abnormalities Noted: No No Abnormalities Noted: Yes Callus: Yes Temperature / Pain Temperature: No Abnormality Moisture No Abnormalities Noted: Yes Treatment Notes Wound #2 (Toe Great) Wound Laterality: Plantar, Left Cleanser Soap and Water Discharge Instruction: May shower and wash wound with dial antibacterial soap and water prior to dressing change. Wound Cleanser Discharge Instruction: Cleanse the wound with wound cleanser prior to applying a clean dressing using gauze sponges, not tissue or cotton balls. Peri-Wound Care Topical Primary Dressing Hydrofera Blue Ready Foam, 4x5 in Discharge Instruction: Apply to wound bed as instructed Secondary Dressing Optifoam Non-Adhesive Dressing, 4x4 in Discharge Instruction: Apply over primary dressing cut to make foam  donut Woven Gauze Sponge, Non-Sterile 4x4 in Discharge Instruction: Apply over primary dressing as directed. Woven Gauze Sponges 2x2 in Discharge Instruction: Apply over primary dressing as directed. Secured With [employer], 4.5x3.1 (in/yd) Discharge Instruction: Secure with Kerlix as directed. [name], [name] ([id number]) [id number] Page 9 of 11 65M Medipore Soft Cloth Surgical T 2x10 (in/yd) ape Discharge Instruction: Secure with tape as directed. Compression Wrap Compression Stockings Add-Ons Electronic Signature(s) Signed: 03/10/2022 5:27:24 PM By: Dellie Catholic RN Entered By: Dellie Catholic on 03/10/2022 11:53:31 -------------------------------------------------------------------------------- Wound Assessment Details Patient Name: Date of Service: Valerie Piano Y S. 03/10/2022 11:00 A M Medical Record Number: [id number] Patient Account Number: [id number] Date of Birth/Sex: Treating RN: 04-06-1985 (36  y.o. [name] Primary Care Lakeem Rozo: [name] Other Clinician: Referring Cable Fearn: Treating Talmage Teaster/Extender: Consuelo Pandy in Treatment: 7 Wound Status Wound Number: 3 Primary Lesion Etiology: Wound Location: Left, Medial T Great oe Wound Open Wounding Event: Trauma Status: Date Acquired: March 12, 2022 Notes: Pt. pulled off "dead skin" from her Left Great toe Weeks Of Treatment: 0 Comorbid Anemia, Hypertension, Type I Diabetes, End Stage Renal Clustered Wound: No History: Disease, Neuropathy, Confinement Anxiety Photos Wound Measurements Length: (cm) 0.5 Width: (cm) 0.3 Depth: (cm) 0.1 Area: (cm) 0.118 Volume: (cm) 0.012 % Reduction in Area: % Reduction in Volume: Epithelialization: Small (1-33%) Tunneling: No Undermining: No Wound Description Classification: Full Thickness Without Exposed Suppor Exudate Amount: Medium Exudate Type: Serosanguineous Exudate Color: red, brown t  Structures Foul Odor After Cleansing: No Slough/Fibrino Yes Wound Bed Granulation Amount: Medium (34-66%) Exposed Structure Granulation Quality: Red Fascia Exposed: No Necrotic Amount: Medium (34-66%) Fat Layer (Subcutaneous Tissue) Exposed: Yes Necrotic Quality: Eschar, Adherent Slough Tendon Exposed: No Muscle Exposed: No Joint Exposed: No Bone Exposed: No [name], [name] ([id number]) [id number] Page 10 of 11 Periwound Skin Texture Texture Color No Abnormalities Noted: No No Abnormalities Noted: No Callus: No Atrophie Blanche: No Crepitus: No Cyanosis: No Excoriation: No Ecchymosis: No Induration: No Erythema: No Rash: No Hemosiderin Staining: No Scarring: No Mottled: No Pallor: No Moisture Rubor: No No Abnormalities Noted: Yes Temperature / Pain Temperature: No Abnormality Treatment Notes Wound #3 (Toe Great) Wound Laterality: Left, Medial Cleanser Soap and Water Discharge Instruction: May shower and wash wound with dial antibacterial soap and water prior to dressing change. Wound Cleanser Discharge Instruction: Cleanse the wound with wound cleanser prior to applying a clean dressing using gauze sponges, not tissue or cotton balls. Peri-Wound Care Topical Primary Dressing Hydrofera Blue Ready Foam, 4x5 in Discharge Instruction: Apply to wound bed as instructed Secondary Dressing Optifoam Non-Adhesive Dressing, 4x4 in Discharge Instruction: Apply over primary dressing cut to make foam donut Woven Gauze Sponge, Non-Sterile 4x4 in Discharge Instruction: Apply over primary dressing as directed. Woven Gauze Sponges 2x2 in Discharge Instruction: Apply over primary dressing as directed. Secured With [employer], 4.5x3.1 (in/yd) Discharge Instruction: Secure with Kerlix as directed. 58M Medipore Soft Cloth Surgical T 2x10 (in/yd) ape Discharge Instruction: Secure with tape as directed. Compression Wrap Compression  Stockings Add-Ons Electronic Signature(s) Signed: 03-12-2022 5:27:24 PM By: Dellie Catholic RN Entered By: Dellie Catholic on 03-12-22 11:43:02 -------------------------------------------------------------------------------- Vitals Details Patient Name: Date of Service: [name], Valerie SHA Y S. 03/12/2022 11:00 A M Medical Record Number: [id number] Patient Account Number: [id number] Date of Birth/Sex: Treating RN: October 30, 1985 (37 y.o. [name] Primary Care Andris Brothers: [name] Other Clinician: Referring Pedro Oldenburg: Treating Jonel Weldon/Extender: Consuelo Pandy in Treatment: 7 Vital Signs [name], [name] ([id number]) [id number] Page 11 of 11 Time Taken: 11:33 Temperature (F): 98.5 Height (in): 66 Pulse (bpm): 89 Weight (lbs): 174 Respiratory Rate (breaths/min): 16 Body Mass Index (BMI): 28.1 Blood Pressure (mmHg): 128/89 Reference Range: 80 - 120 mg / dl Electronic Signature(s) Signed: 2022-03-12 5:27:24 PM By: Dellie Catholic RN Entered By: Dellie Catholic on 03/12/2022 11:33:57

## 2022-03-23 ENCOUNTER — Other Ambulatory Visit: Payer: Self-pay | Admitting: Internal Medicine

## 2022-03-24 LAB — COMPLETE METABOLIC PANEL WITH GFR
AG Ratio: 1 (calc) (ref 1.0–2.5)
ALT: 8 U/L (ref 6–29)
AST: 12 U/L (ref 10–30)
Albumin: 4.4 g/dL (ref 3.6–5.1)
Alkaline phosphatase (APISO): 76 U/L (ref 31–125)
BUN/Creatinine Ratio: 3 (calc) — ABNORMAL LOW (ref 6–22)
BUN: 21 mg/dL (ref 7–25)
CO2: 21 mmol/L (ref 20–32)
Calcium: 9.4 mg/dL (ref 8.6–10.2)
Chloride: 95 mmol/L — ABNORMAL LOW (ref 98–110)
Creat: 6.17 mg/dL — ABNORMAL HIGH (ref 0.50–0.97)
Globulin: 4.2 g/dL (calc) — ABNORMAL HIGH (ref 1.9–3.7)
Glucose, Bld: 448 mg/dL — ABNORMAL HIGH (ref 65–99)
Potassium: 4.4 mmol/L (ref 3.5–5.3)
Sodium: 134 mmol/L — ABNORMAL LOW (ref 135–146)
Total Bilirubin: 0.4 mg/dL (ref 0.2–1.2)
Total Protein: 8.6 g/dL — ABNORMAL HIGH (ref 6.1–8.1)
eGFR: 8 mL/min/{1.73_m2} — ABNORMAL LOW (ref >=60)

## 2022-03-24 LAB — FOLATE: Folate: 9.3 ng/mL

## 2022-03-24 LAB — LIPID PANEL
Cholesterol: 162 mg/dL (ref <200)
HDL: 28 mg/dL — ABNORMAL LOW (ref >=50)
Non-HDL Cholesterol (Calc): 134 mg/dL (calc) — ABNORMAL HIGH (ref <130)
Total CHOL/HDL Ratio: 5.8 (calc) — ABNORMAL HIGH (ref <5.0)
Triglycerides: 579 mg/dL — ABNORMAL HIGH (ref <150)

## 2022-03-24 LAB — VITAMIN D 25 HYDROXY (VIT D DEFICIENCY, FRACTURES): Vit D, 25-Hydroxy: 17 ng/mL — ABNORMAL LOW (ref 30–100)

## 2022-03-24 LAB — CBC
HCT: 33.8 % — ABNORMAL LOW (ref 35.0–45.0)
Hemoglobin: 11.1 g/dL — ABNORMAL LOW (ref 11.7–15.5)
MCH: 31.2 pg (ref 27.0–33.0)
MCHC: 32.8 g/dL (ref 32.0–36.0)
MCV: 94.9 fL (ref 80.0–100.0)
MPV: 10.4 fL (ref 7.5–12.5)
Platelets: 141 10*3/uL (ref 140–400)
RBC: 3.56 10*6/uL — ABNORMAL LOW (ref 3.80–5.10)
RDW: 15.7 % — ABNORMAL HIGH (ref 11.0–15.0)
WBC: 5.6 10*3/uL (ref 3.8–10.8)

## 2022-03-24 LAB — TSH: TSH: 0.79 mIU/L

## 2022-03-24 LAB — VITAMIN B12: Vitamin B-12: 482 pg/mL (ref 200–1100)

## 2022-03-25 ENCOUNTER — Encounter (HOSPITAL_BASED_OUTPATIENT_CLINIC_OR_DEPARTMENT_OTHER): Payer: Medicare Other | Admitting: General Surgery

## 2022-03-29 ENCOUNTER — Emergency Department (HOSPITAL_COMMUNITY): Payer: 59

## 2022-03-29 ENCOUNTER — Encounter (HOSPITAL_BASED_OUTPATIENT_CLINIC_OR_DEPARTMENT_OTHER): Payer: 59 | Attending: General Surgery | Admitting: General Surgery

## 2022-03-29 ENCOUNTER — Other Ambulatory Visit: Payer: Self-pay

## 2022-03-29 ENCOUNTER — Inpatient Hospital Stay (HOSPITAL_COMMUNITY): Payer: 59

## 2022-03-29 ENCOUNTER — Encounter (HOSPITAL_COMMUNITY): Payer: Self-pay | Admitting: Emergency Medicine

## 2022-03-29 ENCOUNTER — Encounter (HOSPITAL_COMMUNITY): Payer: Self-pay | Admitting: *Deleted

## 2022-03-29 ENCOUNTER — Inpatient Hospital Stay (HOSPITAL_COMMUNITY)
Admission: EM | Admit: 2022-03-29 | Discharge: 2022-04-04 | DRG: 617 | Disposition: A | Discharge status: Home Health | Payer: 59 | Source: Ambulatory Visit | Attending: Internal Medicine | Admitting: Internal Medicine

## 2022-03-29 DIAGNOSIS — Z8669 Personal history of other diseases of the nervous system and sense organs: Secondary | ICD-10-CM

## 2022-03-29 DIAGNOSIS — Z888 Allergy status to other drugs, medicaments and biological substances status: Secondary | ICD-10-CM

## 2022-03-29 DIAGNOSIS — S91302A Unspecified open wound, left foot, initial encounter: Principal | ICD-10-CM

## 2022-03-29 DIAGNOSIS — R569 Unspecified convulsions: Secondary | ICD-10-CM

## 2022-03-29 DIAGNOSIS — L97529 Non-pressure chronic ulcer of other part of left foot with unspecified severity: Secondary | ICD-10-CM | POA: Diagnosis not present

## 2022-03-29 DIAGNOSIS — E1042 Type 1 diabetes mellitus with diabetic polyneuropathy: Secondary | ICD-10-CM | POA: Diagnosis present

## 2022-03-29 DIAGNOSIS — K219 Gastro-esophageal reflux disease without esophagitis: Secondary | ICD-10-CM | POA: Diagnosis present

## 2022-03-29 DIAGNOSIS — G8929 Other chronic pain: Secondary | ICD-10-CM | POA: Diagnosis present

## 2022-03-29 DIAGNOSIS — L97526 Non-pressure chronic ulcer of other part of left foot with bone involvement without evidence of necrosis: Secondary | ICD-10-CM | POA: Diagnosis present

## 2022-03-29 DIAGNOSIS — Z91119 Patient's noncompliance with dietary regimen due to unspecified reason: Secondary | ICD-10-CM

## 2022-03-29 DIAGNOSIS — M898X9 Other specified disorders of bone, unspecified site: Secondary | ICD-10-CM | POA: Diagnosis present

## 2022-03-29 DIAGNOSIS — L03032 Cellulitis of left toe: Secondary | ICD-10-CM | POA: Diagnosis present

## 2022-03-29 DIAGNOSIS — E1022 Type 1 diabetes mellitus with diabetic chronic kidney disease: Secondary | ICD-10-CM | POA: Diagnosis present

## 2022-03-29 DIAGNOSIS — I120 Hypertensive chronic kidney disease with stage 5 chronic kidney disease or end stage renal disease: Secondary | ICD-10-CM | POA: Diagnosis present

## 2022-03-29 DIAGNOSIS — E10621 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: Secondary | ICD-10-CM | POA: Diagnosis present

## 2022-03-29 DIAGNOSIS — D631 Anemia in chronic kidney disease: Secondary | ICD-10-CM | POA: Diagnosis present

## 2022-03-29 DIAGNOSIS — Z794 Long term (current) use of insulin: Secondary | ICD-10-CM | POA: Diagnosis not present

## 2022-03-29 DIAGNOSIS — L97528 Non-pressure chronic ulcer of other part of left foot with other specified severity: Secondary | ICD-10-CM | POA: Diagnosis not present

## 2022-03-29 DIAGNOSIS — E1065 Type 1 diabetes mellitus with hyperglycemia: Secondary | ICD-10-CM | POA: Diagnosis not present

## 2022-03-29 DIAGNOSIS — N186 End stage renal disease: Secondary | ICD-10-CM

## 2022-03-29 DIAGNOSIS — E875 Hyperkalemia: Secondary | ICD-10-CM | POA: Diagnosis present

## 2022-03-29 DIAGNOSIS — Z833 Family history of diabetes mellitus: Secondary | ICD-10-CM | POA: Insufficient documentation

## 2022-03-29 DIAGNOSIS — Q970 Karyotype 47, XXX: Secondary | ICD-10-CM | POA: Diagnosis not present

## 2022-03-29 DIAGNOSIS — L03116 Cellulitis of left lower limb: Secondary | ICD-10-CM | POA: Diagnosis present

## 2022-03-29 DIAGNOSIS — E1069 Type 1 diabetes mellitus with other specified complication: Secondary | ICD-10-CM | POA: Diagnosis present

## 2022-03-29 DIAGNOSIS — E1122 Type 2 diabetes mellitus with diabetic chronic kidney disease: Secondary | ICD-10-CM | POA: Diagnosis not present

## 2022-03-29 DIAGNOSIS — A419 Sepsis, unspecified organism: Secondary | ICD-10-CM | POA: Diagnosis present

## 2022-03-29 DIAGNOSIS — L02612 Cutaneous abscess of left foot: Secondary | ICD-10-CM | POA: Diagnosis present

## 2022-03-29 DIAGNOSIS — M869 Osteomyelitis, unspecified: Secondary | ICD-10-CM | POA: Diagnosis present

## 2022-03-29 DIAGNOSIS — E785 Hyperlipidemia, unspecified: Secondary | ICD-10-CM | POA: Diagnosis present

## 2022-03-29 DIAGNOSIS — T8619 Other complication of kidney transplant: Secondary | ICD-10-CM | POA: Diagnosis present

## 2022-03-29 DIAGNOSIS — E11621 Type 2 diabetes mellitus with foot ulcer: Secondary | ICD-10-CM

## 2022-03-29 DIAGNOSIS — R197 Diarrhea, unspecified: Secondary | ICD-10-CM | POA: Diagnosis not present

## 2022-03-29 DIAGNOSIS — E109 Type 1 diabetes mellitus without complications: Secondary | ICD-10-CM | POA: Diagnosis present

## 2022-03-29 DIAGNOSIS — Z8249 Family history of ischemic heart disease and other diseases of the circulatory system: Secondary | ICD-10-CM | POA: Insufficient documentation

## 2022-03-29 DIAGNOSIS — F819 Developmental disorder of scholastic skills, unspecified: Secondary | ICD-10-CM | POA: Diagnosis present

## 2022-03-29 DIAGNOSIS — N2581 Secondary hyperparathyroidism of renal origin: Secondary | ICD-10-CM | POA: Insufficient documentation

## 2022-03-29 DIAGNOSIS — L97422 Non-pressure chronic ulcer of left heel and midfoot with fat layer exposed: Secondary | ICD-10-CM | POA: Diagnosis not present

## 2022-03-29 DIAGNOSIS — Z1152 Encounter for screening for COVID-19: Secondary | ICD-10-CM | POA: Diagnosis not present

## 2022-03-29 DIAGNOSIS — Z91041 Radiographic dye allergy status: Secondary | ICD-10-CM

## 2022-03-29 DIAGNOSIS — Z886 Allergy status to analgesic agent status: Secondary | ICD-10-CM

## 2022-03-29 DIAGNOSIS — Z940 Kidney transplant status: Secondary | ICD-10-CM | POA: Insufficient documentation

## 2022-03-29 DIAGNOSIS — Z9049 Acquired absence of other specified parts of digestive tract: Secondary | ICD-10-CM

## 2022-03-29 DIAGNOSIS — Z8261 Family history of arthritis: Secondary | ICD-10-CM

## 2022-03-29 DIAGNOSIS — E1040 Type 1 diabetes mellitus with diabetic neuropathy, unspecified: Secondary | ICD-10-CM | POA: Insufficient documentation

## 2022-03-29 DIAGNOSIS — L97509 Non-pressure chronic ulcer of other part of unspecified foot with unspecified severity: Secondary | ICD-10-CM | POA: Diagnosis not present

## 2022-03-29 DIAGNOSIS — Z765 Malingerer [conscious simulation]: Secondary | ICD-10-CM

## 2022-03-29 DIAGNOSIS — Z992 Dependence on renal dialysis: Secondary | ICD-10-CM

## 2022-03-29 DIAGNOSIS — E1169 Type 2 diabetes mellitus with other specified complication: Secondary | ICD-10-CM | POA: Diagnosis not present

## 2022-03-29 DIAGNOSIS — Z79899 Other long term (current) drug therapy: Secondary | ICD-10-CM

## 2022-03-29 HISTORY — DX: Nausea with vomiting, unspecified: R11.2

## 2022-03-29 LAB — COMPREHENSIVE METABOLIC PANEL
ALT: 11 U/L (ref 0–44)
AST: 15 U/L (ref 15–41)
Albumin: 3.9 g/dL (ref 3.5–5.0)
Alkaline Phosphatase: 59 U/L (ref 38–126)
Anion gap: 14 (ref 5–15)
BUN: 39 mg/dL — ABNORMAL HIGH (ref 6–20)
CO2: 25 mmol/L (ref 22–32)
Calcium: 8.4 mg/dL — ABNORMAL LOW (ref 8.9–10.3)
Chloride: 92 mmol/L — ABNORMAL LOW (ref 98–111)
Creatinine, Ser: 9.44 mg/dL — ABNORMAL HIGH (ref 0.44–1.00)
GFR, Estimated: 5 mL/min — ABNORMAL LOW (ref >60)
Glucose, Bld: 242 mg/dL — ABNORMAL HIGH (ref 70–99)
Potassium: 5.5 mmol/L — ABNORMAL HIGH (ref 3.5–5.1)
Sodium: 131 mmol/L — ABNORMAL LOW (ref 135–145)
Total Bilirubin: 0.6 mg/dL (ref 0.3–1.2)
Total Protein: 9.4 g/dL — ABNORMAL HIGH (ref 6.5–8.1)

## 2022-03-29 LAB — CBC WITH DIFFERENTIAL/PLATELET
Abs Immature Granulocytes: 0.02 10*3/uL (ref 0.00–0.07)
Basophils Absolute: 0.1 10*3/uL (ref 0.0–0.1)
Basophils Relative: 1 %
Eosinophils Absolute: 0.3 10*3/uL (ref 0.0–0.5)
Eosinophils Relative: 3 %
HCT: 35 % — ABNORMAL LOW (ref 36.0–46.0)
Hemoglobin: 10.8 g/dL — ABNORMAL LOW (ref 12.0–15.0)
Immature Granulocytes: 0 %
Lymphocytes Relative: 12 %
Lymphs Abs: 1.1 10*3/uL (ref 0.7–4.0)
MCH: 30.3 pg (ref 26.0–34.0)
MCHC: 30.9 g/dL (ref 30.0–36.0)
MCV: 98.3 fL (ref 80.0–100.0)
Monocytes Absolute: 0.3 10*3/uL (ref 0.1–1.0)
Monocytes Relative: 3 %
Neutro Abs: 7.9 10*3/uL — ABNORMAL HIGH (ref 1.7–7.7)
Neutrophils Relative %: 81 %
Platelets: 221 10*3/uL (ref 150–400)
RBC: 3.56 MIL/uL — ABNORMAL LOW (ref 3.87–5.11)
RDW: 16.6 % — ABNORMAL HIGH (ref 11.5–15.5)
WBC: 9.6 10*3/uL (ref 4.0–10.5)
nRBC: 0 % (ref 0.0–0.2)

## 2022-03-29 LAB — SEDIMENTATION RATE: Sed Rate: 100 mm/hr — ABNORMAL HIGH (ref 0–22)

## 2022-03-29 LAB — RESP PANEL BY RT-PCR (RSV, FLU A&B, COVID)  RVPGX2
Influenza A by PCR: NEGATIVE
Influenza B by PCR: NEGATIVE
Resp Syncytial Virus by PCR: NEGATIVE
SARS Coronavirus 2 by RT PCR: NEGATIVE

## 2022-03-29 LAB — C-REACTIVE PROTEIN: CRP: 19.2 mg/dL — ABNORMAL HIGH (ref <1.0)

## 2022-03-29 LAB — PROTIME-INR
INR: 1.1 (ref 0.8–1.2)
Prothrombin Time: 13.8 seconds (ref 11.4–15.2)

## 2022-03-29 LAB — LACTIC ACID, PLASMA: Lactic Acid, Venous: 1 mmol/L (ref 0.5–1.9)

## 2022-03-29 LAB — CBG MONITORING, ED
Glucose-Capillary: 235 mg/dL — ABNORMAL HIGH (ref 70–99)
Glucose-Capillary: 376 mg/dL — ABNORMAL HIGH (ref 70–99)

## 2022-03-29 MED ORDER — INSULIN ASPART 100 UNIT/ML IJ SOLN
0.0000 [IU] | INTRAMUSCULAR | Status: DC
  Administered 2022-03-29: 9 [IU] via SUBCUTANEOUS
  Administered 2022-03-29: 3 [IU] via SUBCUTANEOUS
  Administered 2022-03-30: 2 [IU] via SUBCUTANEOUS
  Administered 2022-03-30: 1 [IU] via SUBCUTANEOUS
  Administered 2022-03-30 – 2022-03-31 (×3): 2 [IU] via SUBCUTANEOUS
  Administered 2022-03-31: 3 [IU] via SUBCUTANEOUS
  Administered 2022-03-31: 2 [IU] via SUBCUTANEOUS
  Administered 2022-03-31: 3 [IU] via SUBCUTANEOUS
  Administered 2022-03-31 – 2022-04-01 (×2): 2 [IU] via SUBCUTANEOUS
  Filled 2022-03-29 (×5): qty 1

## 2022-03-29 MED ORDER — ACETAMINOPHEN 325 MG PO TABS: 650.0000 mg | ORAL_TABLET | Freq: Four times a day (QID) | ORAL | Status: DC | PRN

## 2022-03-29 MED ORDER — CALCIUM CARBONATE ANTACID 1250 MG/5ML PO SUSP
500.0000 mg | Freq: Every day | ORAL | Status: DC
  Administered 2022-03-30 – 2022-04-04 (×4): 500 mg via ORAL
  Filled 2022-03-29 (×7): qty 5

## 2022-03-29 MED ORDER — ACETAMINOPHEN 650 MG RE SUPP: 650.0000 mg | Freq: Four times a day (QID) | RECTAL | Status: DC | PRN

## 2022-03-29 MED ORDER — SEVELAMER CARBONATE 2.4 G PO PACK
2.4000 g | PACK | Freq: Three times a day (TID) | ORAL | Status: DC
  Administered 2022-03-30 – 2022-04-04 (×7): 2.4 g via ORAL
  Filled 2022-03-29 (×18): qty 1

## 2022-03-29 MED ORDER — VANCOMYCIN HCL 750 MG/150ML IV SOLN
750.0000 mg | INTRAVENOUS | Status: DC
  Administered 2022-03-30 – 2022-04-03 (×3): 750 mg via INTRAVENOUS
  Filled 2022-03-29 (×3): qty 150

## 2022-03-29 MED ORDER — CALCIUM ACETATE (PHOS BINDER) 667 MG PO CAPS
1334.0000 mg | ORAL_CAPSULE | Freq: Three times a day (TID) | ORAL | Status: DC
  Administered 2022-03-30 – 2022-04-04 (×10): 1334 mg via ORAL
  Filled 2022-03-29 (×12): qty 2

## 2022-03-29 MED ORDER — VANCOMYCIN HCL 1500 MG/300ML IV SOLN
1500.0000 mg | Freq: Once | INTRAVENOUS | Status: AC
  Administered 2022-03-29: 1500 mg via INTRAVENOUS
  Filled 2022-03-29: qty 300

## 2022-03-29 MED ORDER — INSULIN DEGLUDEC 100 UNIT/ML ~~LOC~~ SOPN
8.0000 [IU] | PEN_INJECTOR | SUBCUTANEOUS | Status: DC
  Filled 2022-03-29: qty 3

## 2022-03-29 MED ORDER — POLYETHYLENE GLYCOL 3350 17 G PO PACK: 17.0000 g | PACK | Freq: Every day | ORAL | Status: DC | PRN

## 2022-03-29 MED ORDER — PIPERACILLIN-TAZOBACTAM 3.375 G IVPB 30 MIN
3.3750 g | Freq: Once | INTRAVENOUS | Status: AC
  Administered 2022-03-29: 3.375 g via INTRAVENOUS

## 2022-03-29 MED ORDER — SODIUM CHLORIDE 0.9 % IV SOLN
2.2500 g | Freq: Three times a day (TID) | INTRAVENOUS | Status: DC
  Administered 2022-03-30 (×3): 2.25 g via INTRAVENOUS
  Filled 2022-03-29 (×7): qty 10

## 2022-03-29 MED ORDER — HYDROCODONE-ACETAMINOPHEN 5-325 MG PO TABS
1.0000 | ORAL_TABLET | Freq: Four times a day (QID) | ORAL | Status: DC | PRN
  Administered 2022-03-29 – 2022-04-02 (×4): 1 via ORAL
  Filled 2022-03-29 (×5): qty 1

## 2022-03-29 MED ORDER — PIPERACILLIN-TAZOBACTAM IN DEX 2-0.25 GM/50ML IV SOLN: 2.2500 g | Freq: Three times a day (TID) | INTRAVENOUS | Status: DC

## 2022-03-29 MED ORDER — PIPERACILLIN-TAZOBACTAM IN DEX 2-0.25 GM/50ML IV SOLN
2.2500 g | Freq: Three times a day (TID) | INTRAVENOUS | Status: DC
  Filled 2022-03-29 (×2): qty 50

## 2022-03-29 MED ORDER — PIPERACILLIN-TAZOBACTAM 3.375 G IVPB
3.3750 g | Freq: Once | INTRAVENOUS | Status: DC
  Filled 2022-03-29: qty 50

## 2022-03-29 NOTE — H&P (Addendum)
History and Physical    [name] EHU:[id number] DOB: 05/09/1985 DOA: 03/29/2022  PCP: [name], MD   Patient coming from: Home  I have personally briefly reviewed patient's old medical records in [place]  Chief Complaint: Left leg swelling  HPI: [name] is a 37 y.o. female with medical history significant for type 1 diabetes, ESRD Tuesday Thursday Saturday,  seizures/pseudoseizures. Patient presented to the ED with complaints of pain and swelling to her left lower foot.  Symptoms started with some pain to her left leg on Tuesday a week ago, 1/9.  2 days later she reports a blister on her left big toe suddenly opened up with drainage of foul-smelling discharge.  She follows with wound care clinic.  She reports just after Thanksgiving in November, she had an accident at [employer] [facility] tomato jar fell, and broke, and the glass pierced through her foot with puncturing her left big toe.  She denies any symptoms after this.  She denies any subsequent trauma to the big toe.  She reports hot gravy poured on her left foot causing wounds to the sole last year, she reports the wounds from this have healed.  She has neuropathy to bilateral feet.  Patient was at [facility] ER, but came to [facility] due to long wait time.  ED Course: Tmax 101.  Tachycardic to 103.  Respirate rate 16.  Blood pressure 111/86.  WBC 9.6.  Lactic acid 1.  X-ray of the left foot negative for acute findings, no evidence of osteomyelitis. IV vancomycin and Zosyn started.  Hospitalist to admit for cellulitis.  Review of Systems: As per HPI all other systems reviewed and negative.  Past Medical History:  Diagnosis Date   Anemia    Blood transfusion without reported diagnosis    Cellulitis of left leg 03/01/2018   Chronic kidney disease    kidney transplant 07   Diabetes mellitus    Pt reports diagnosis in June 2011, Type 2   Diabetes mellitus without complication ([place])    Esophageal obstruction due to  food impaction    GERD (gastroesophageal reflux disease)    Hyperlipidemia    Hypertension    Intra-abdominal abscess ([place]) 10/28/2018   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   LEARNING DISABILITY 09/25/2007   Qualifier: Diagnosis of  By: Deborra Medina MD, Talia     Nausea and vomiting    Prolonged Q-T interval on ECG    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   Pyelonephritis 06/23/2014   Renal and perinephric abscess 11/01/2018   Renal disorder    Seasonal allergies    Seizures ([place])    UTI (urinary tract infection) 01/09/2015   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   Qualifier: Diagnosis of  By: Carlena Sax  MD, Colletta [place]      Past Surgical History:  Procedure Laterality Date   ARTERIOVENOUS GRAFT PLACEMENT Bilateral    "neither work" (10/24/2017)   AV FISTULA PLACEMENT Left 10/26/2018   Procedure: CREATION OF ARTERIOVENOUS FISTULA  LEFT ARM;  Surgeon: [name], MD;  Location: [place];  Service: Vascular;  Laterality: Left;   AV FISTULA PLACEMENT Left 05/23/2020   Procedure: LEFT ARM ARTERIOVENOUS (AV) FISTULA CREATION;  Surgeon: [name], MD;  Location: [place];  Service: Vascular;  Laterality: Left;   BALLOON DILATION N/A 01/19/2021   Procedure: Larrie Kass DILATION;  Surgeon: Gatha Mayer, MD;  [facility];  Service: Endoscopy;  Laterality: N/A;   BALLOON DILATION N/A 02/10/2021   Procedure: BALLOON  DILATION;  Surgeon: Irene Shipper, MD;  [facility];  Service: Endoscopy;  Laterality: N/A;   BALLOON DILATION N/A 02/19/2021   Procedure: BALLOON DILATION;  Surgeon: Sharyn Creamer, MD;  Location[facility];  Service: Gastroenterology;  Laterality: N/A;   BASCILIC VEIN TRANSPOSITION Left 12/21/2018   Procedure: Left arm BASILIC VEIN TRANSPOSITION SECOND STAGE;  Surgeon: [name], MD;  Location: [place];  Service: Vascular;  Laterality: Left;   BIOPSY  12/29/2021   Procedure: BIOPSY;  Surgeon: Ladene Artist, MD;  Location: [place];  Service:  Gastroenterology;;   CHOLECYSTECTOMY N/A 06/30/2017   Procedure: LAPAROSCOPIC CHOLECYSTECTOMY WITH INTRAOPERATIVE CHOLANGIOGRAM;  Surgeon: Excell Seltzer, MD;  [facility];  Service: General;  Laterality: N/A;   ESOPHAGOGASTRODUODENOSCOPY (EGD) WITH PROPOFOL N/A 07/04/2017   Procedure: ESOPHAGOGASTRODUODENOSCOPY (EGD) WITH PROPOFOL;  Surgeon: Clarene Essex, MD;  [facility];  Service: Endoscopy;  Laterality: N/A;   ESOPHAGOGASTRODUODENOSCOPY (EGD) WITH PROPOFOL N/A 08/10/2020   Procedure: ESOPHAGOGASTRODUODENOSCOPY (EGD) WITH PROPOFOL;  Surgeon: Doran Stabler, MD;  Location: [place];  Service: Gastroenterology;  Laterality: N/A;   ESOPHAGOGASTRODUODENOSCOPY (EGD) WITH PROPOFOL N/A 01/19/2021   Procedure: ESOPHAGOGASTRODUODENOSCOPY (EGD) WITH PROPOFOL;  Surgeon: Gatha Mayer, MD;  [facility];  Service: Endoscopy;  Laterality: N/A;  WITH FLUOROSCOPY AND DILATION   ESOPHAGOGASTRODUODENOSCOPY (EGD) WITH PROPOFOL N/A 02/03/2021   Procedure: ESOPHAGOGASTRODUODENOSCOPY (EGD) WITH PROPOFOL;  Surgeon: Gatha Mayer, MD;  [facility];  Service: Endoscopy;  Laterality: N/A;   ESOPHAGOGASTRODUODENOSCOPY (EGD) WITH PROPOFOL N/A 02/10/2021   Procedure: ESOPHAGOGASTRODUODENOSCOPY (EGD) WITH PROPOFOL;  Surgeon: Irene Shipper, MD;  [facility];  Service: Endoscopy;  Laterality: N/A;  Balloon Dilation   ESOPHAGOGASTRODUODENOSCOPY (EGD) WITH PROPOFOL N/A 02/19/2021   Procedure: ESOPHAGOGASTRODUODENOSCOPY (EGD) WITH PROPOFOL;  Surgeon: Sharyn Creamer, MD;  [facility];  Service: Gastroenterology;  Laterality: N/A;   ESOPHAGOGASTRODUODENOSCOPY (EGD) WITH PROPOFOL N/A 03/12/2021   Procedure: ESOPHAGOGASTRODUODENOSCOPY (EGD) WITH PROPOFOL;  Surgeon: Lavena Bullion, DO;  [facility];  Service: Gastroenterology;  Laterality: N/A;   ESOPHAGOGASTRODUODENOSCOPY (EGD) WITH PROPOFOL N/A 12/29/2021   Procedure: ESOPHAGOGASTRODUODENOSCOPY  (EGD) WITH PROPOFOL;  Surgeon: Ladene Artist, MD;  Location: [place];  Service: Gastroenterology;  Laterality: N/A;   FLEXIBLE SIGMOIDOSCOPY N/A 12/29/2021   Procedure: FLEXIBLE SIGMOIDOSCOPY;  Surgeon: Ladene Artist, MD;  Location: [place];  Service: Gastroenterology;  Laterality: N/A;   FOREIGN BODY REMOVAL N/A 02/03/2021   Procedure: FOREIGN BODY REMOVAL;  Surgeon: Gatha Mayer, MD;  [facility];  Service: Endoscopy;  Laterality: N/A;   INSERTION OF DIALYSIS CATHETER N/A 03/20/2018   Procedure: INSERTION OF TUNNELED DIALYSIS CATHETER - RIGHT INTERANL JUGULAR PLACEMENT;  Surgeon: Angelia Mould, MD;  Location: [place];  Service: Vascular;  Laterality: N/A;   IR FLUORO GUIDE CV LINE RIGHT  04/18/2020   IR GUIDED DRAIN W CATHETER PLACEMENT  10/28/2018   KIDNEY TRANSPLANT  2007   KIDNEY TRANSPLANT Right    PARATHYROIDECTOMY  ?2012   "3/4 removed" (10/24/2017)   RENAL BIOPSY Bilateral 2003   REVISON OF ARTERIOVENOUS FISTULA Left 09/24/2020   Procedure: LEFT UPPER ARM ARTERIOVENOUS GRAFT CREATION;  Surgeon: [name], MD;  Location: [place];  Service: Vascular;  Laterality: Left;   UPPER EXTREMITY VENOGRAPHY Bilateral 10/19/2018   Procedure: UPPER EXTREMITY VENOGRAPHY;  Surgeon: [name], MD;  Location: [place] [facility];  Service: Cardiovascular;  Laterality: Bilateral;  Bilateral    UPPER EXTREMITY VENOGRAPHY Left 09/04/2020  Procedure: UPPER EXTREMITY VENOGRAPHY - Left Upper;  Surgeon: [name], MD;  Location: [place] [facility];  Service: Cardiovascular;  Laterality: Left;     reports that she has never smoked. She has never been exposed to tobacco smoke. She has never used smokeless tobacco. She reports that she does not currently use alcohol. She reports that she does not use drugs.  Allergies  Allergen Reactions   Diphenhydramine Anaphylaxis   Motrin [Ibuprofen] Shortness Of Breath and Itching   Contrast Media [Iodinated  Contrast Media] Itching   Peanut-Containing Drug Products Itching    Able to tolerate when cooked in foods   Shellfish Allergy Hives   Banana Itching, Nausea And Vomiting and Other (See Comments)    Sick on the stomach   Chlorhexidine Itching   Ferrous Sulfate Itching   Iron Dextran Itching and Other (See Comments)    Vein irritation     Family History  Problem Relation Age of Onset   Arthritis Mother    Hypertension Mother    Aneurysm Mother        died of brain aneurysm   CAD Father        Has 3 stents   Diabetes Father        borderline   Early death Brother        Died in war   Colon cancer Neg Hx    Esophageal cancer Neg Hx    Rectal cancer Neg Hx    Stomach cancer Neg Hx     Prior to Admission medications   Medication Sig Start Date End Date Taking? Authorizing Provider  ACCU-CHEK SOFTCLIX LANCETS lancets Use to check blood sugar 4 times per day. 12/29/15   Renato Shin, MD  acetaminophen (TYLENOL) 500 MG tablet Take 500-1,000 mg by mouth every 6 (six) hours as needed for moderate pain.    [provider]  calcium acetate (PHOSLO) 667 MG capsule Take 1,334 mg by mouth 3 (three) times daily with meals. Take 1-2 capsules (992-4268 mg) by mouth with snacks & take 2 capsules (1334 mg) by mouth with each meal 10/26/20   [provider]  Calcium Carbonate Antacid (CALCIUM CARBONATE, DOSED IN MG ELEMENTAL CALCIUM,) 1250 MG/5ML SUSP Take 500 mg of elemental calcium by mouth daily. 10/22/20   [provider]  cetirizine (ZYRTEC) 10 MG tablet Take 10 mg by mouth daily as needed for allergies.    [provider]  famotidine (PEPCID) 40 MG tablet Take 1 tablet (40 mg total) by mouth 2 (two) times daily as needed for heartburn or indigestion. 01/01/22   Jennye Boroughs, MD  fluticasone (FLONASE) 50 MCG/ACT nasal spray Place 2 sprays into both nostrils daily as needed for allergies. 09/03/18   [name], MD  gabapentin (NEURONTIN) 400 MG capsule  Take 800 mg by mouth daily as needed (pain). 12/30/20   [provider]  hydrOXYzine (ATARAX/VISTARIL) 50 MG tablet Take 50 mg by mouth 2 (two) times daily as needed for itching. 11/03/20   [provider]  insulin aspart (NOVOLOG FLEXPEN) 100 UNIT/ML FlexPen Inject 0-15 Units into the skin in the morning, at noon, in the evening, and at bedtime. Sliding Scale insulin    [provider]  insulin degludec (TRESIBA) 100 UNIT/ML FlexTouch Pen Inject 5-10 Units into the skin daily. 01/01/22   Jennye Boroughs, MD  Insulin Pen Needle (BD PEN NEEDLE NANO 2ND GEN) 32G X 4 MM MISC 1 each by Does not apply route in the  morning, at noon, in the evening, and at bedtime. 08/10/20   Barb Merino, MD  lidocaine-prilocaine (EMLA) cream Apply 1 application topically daily as needed [facility]). 12/17/20   [provider]  loperamide (IMODIUM) 2 MG capsule Take 2 mg by mouth as needed for diarrhea or loose stools. 09/11/20   [provider]  metoprolol tartrate (LOPRESSOR) 25 MG tablet Take 1 tablet (25 mg total) by mouth daily as needed (for systolic blood pressure or 135 or above). 01/01/22   Jennye Boroughs, MD  omeprazole (PRILOSEC) 40 MG capsule Take 1 capsule (40 mg total) by mouth 2 (two) times daily before a meal. Open capsule and place granules in water or applesauce to swallow 03/17/21   Gatha Mayer, MD  ondansetron (ZOFRAN-ODT) 8 MG disintegrating tablet Take 1 tablet (8 mg total) by mouth every 8 (eight) hours as needed for nausea or vomiting. 01/01/22   Jennye Boroughs, MD  oxyCODONE-acetaminophen (PERCOCET/ROXICET) 5-325 MG tablet Take 1 tablet by mouth daily as needed for severe pain. 01/01/22   Jennye Boroughs, MD  RENVELA 2.4 g PACK Take 2.4 g by mouth 3 (three) times daily. 05/04/21   [provider]  silver sulfADIAZINE (SILVADENE) 1 % cream Apply 1 Application topically daily as needed (wound care). 01/01/22   Jennye Boroughs, MD  simvastatin (ZOCOR) 20 MG  tablet Take 20 mg by mouth at bedtime.    [provider]  tiZANidine (ZANAFLEX) 2 MG tablet Take 2 mg by mouth 2 (two) times daily as needed for muscle spasms. 11/18/21   [provider]  trolamine salicylate (ASPERCREME/ALOE) 10 % cream Apply 1 application. topically as needed for muscle pain. 07/30/21   Blanchie Dessert, MD    Physical Exam: Vitals:   03/29/22 1219 03/29/22 1220  BP: 111/86   Pulse: (!) 103   Resp: 16   Temp: (!) 101 F (38.3 C)   TempSrc: Oral   SpO2: 96%   Weight:  78.9 kg  Height:  5\' 6"  (1.676 m)    Constitutional: NAD, calm, comfortable Vitals:   03/29/22 1219 03/29/22 1220  BP: 111/86   Pulse: (!) 103   Resp: 16   Temp: (!) 101 F (38.3 C)   TempSrc: Oral   SpO2: 96%   Weight:  78.9 kg  Height:  5\' 6"  (1.676 m)   Eyes: PERRL, lids and conjunctivae normal ENMT: Mucous membranes are moist.  Neck: normal, supple, no masses, no thyromegaly Respiratory: clear to auscultation bilaterally, no wheezing, no crackles.  Cardiovascular: Regular rate and rhythm, no murmurs / rubs / gallops.  Trace pitting to left lower extremity.  Extremities warm. Abdomen: no tenderness, no masses palpated. No hepatosplenomegaly. Bowel sounds positive.  Musculoskeletal: no clubbing / cyanosis. No joint deformity upper and lower extremities. Skin: Open wound to left big toe, with foul smell, swelling extending towards lower leg, hyperpigmentation, healing wounds to sole of left foot. Neurologic: No evidence of aphasia, no facial asymmetry, moving extremities spontaneously Psychiatric: Normal judgment and insight. Alert and oriented x 3. Normal mood.         Labs on Admission: I have personally reviewed following labs and imaging studies  CBC: Recent Labs  Lab 03/23/22 1500 03/29/22 1424  WBC 5.6 9.6  NEUTROABS  --  7.9*  HGB 11.1* 10.8*  HCT 33.8* 35.0*  MCV 94.9 98.3  PLT 141 865   Basic Metabolic Panel: Recent Labs  Lab 03/23/22 1500  03/29/22 1424  NA 134* 131*  K 4.4 5.5*  CL 95* 92*  CO2 21 25  GLUCOSE 448* 242*  BUN 21 39*  CREATININE 6.17* 9.44*  CALCIUM 9.4 8.4*   GFR: Estimated Creatinine Clearance: 8.6 mL/min (A) (by C-G formula based on SCr of 9.44 mg/dL (H)). Liver Function Tests: Recent Labs  Lab 03/23/22 1500 03/29/22 1424  AST 12 15  ALT 8 11  ALKPHOS  --  59  BILITOT 0.4 0.6  PROT 8.6* 9.4*  ALBUMIN  --  3.9   Coagulation Profile: Recent Labs  Lab 03/29/22 1424  INR 1.1   Radiological Exams on Admission: DG Foot Complete Left  Result Date: 03/29/2022 CLINICAL DATA:  Foot pain, wound/ulcer. EXAM: LEFT FOOT - COMPLETE 3+ VIEW COMPARISON:  02/26/2021 FINDINGS: Minimal degenerative change of the first MTP joint and midfoot. No focal bone destruction to suggest osteomyelitis. Mild soft tissue swelling over the mid to forefoot. No air within the soft tissues. Small inferior calcaneal spur. IMPRESSION: 1. No acute findings. No evidence of osteomyelitis. 2. Mild soft tissue swelling. Electronically Signed   By: Marin Olp M.D.   On: 03/29/2022 14:58   DG Chest Port 1 View  Result Date: 03/29/2022 CLINICAL DATA:  Foot pain/ulcer. EXAM: PORTABLE CHEST 1 VIEW COMPARISON:  12/27/2021 FINDINGS: Lungs are hypoinflated and otherwise clear. Cardiomediastinal silhouette and remainder of the exam is unchanged. IMPRESSION: Hypoinflation without acute cardiopulmonary disease. Electronically Signed   By: Marin Olp M.D.   On: 03/29/2022 14:52    EKG: None  Assessment/Plan Principal Problem:   Diabetic foot ulcer (HCC) Active Problems:   Sepsis ([place])   ESRD on hemodialysis ([place])   Anemia due to chronic kidney disease   XXX syndrome   Diabetes mellitus type 1 ([place])   Seizures ([place])   Assessment and Plan: * Diabetic foot ulcer ([place]) Ulcer to big left toe with surrounding cellulitis, foul smell, with swelling, extending towards ankle.  X-ray negative for acute abnormality.  Meeting sepsis criteria  with fever of 101, tachycardia heart rate 103.  Normal lactic acid 1.  WBC 9.6. -Admit to [facility], -Obtain MRI of the left foot -ESR, CRP -N.p.o. midnight - ABI left foot -Continue broad-spectrum antibiotics IV vancomycin and Zosyn -Please consult Dr. Jess Barters team in a.m. I have sent a secure chat to Dr. Sharol Given, I also called Ortho care on-call for Dr. Sharol Given team, no response.  ESRD on hemodialysis [facility]) Schedule Tuesday Thursday Saturday.  Appears euvolemic at this time. -Please consult nephrology in a.m. for HD. -Resume renal meds  Seizures ([place]) History of seizures/pseudo seizures.  She is not on medications.  She reports last seizure about a year ago.  Diabetes mellitus type 1 (HCC) - SSI- S -Resume Tresiba/long-acting insulin at reduced dose 8u daily( Home dose ~10u)  Anemia due to chronic kidney disease Hemoglobin 10.8.  Stable.   DVT prophylaxis: SCDS for now pending Podiatry eval Code Status: FULL code Family Communication: Female friend at bedside Disposition Plan: > 2 days Consults called: Pls consult nephrology and Dr Jess Barters team in a.m. I called Ortho-care on call for Dr. Jess Barters team, but no response.  I have sent Dr. Sharol Given a secure chat message. Admission status:  Inpt Tele  I certify that at the point of admission it is my clinical judgment that the patient will require inpatient hospital care spanning beyond 2 midnights from the point of admission due to high intensity of service, high risk for further deterioration and high frequency of surveillance required.    Author: Bethena Roys,  MD 03/29/2022 4:30 PM  For on call review [email or web address].

## 2022-03-29 NOTE — Assessment & Plan Note (Signed)
Hemoglobin 10.8.  Stable.

## 2022-03-29 NOTE — Assessment & Plan Note (Addendum)
-  SSI- S -Resume Tresiba/long-acting insulin at reduced dose 8u daily( Home dose ~10u)

## 2022-03-29 NOTE — Assessment & Plan Note (Signed)
Schedule Tuesday Thursday Saturday.  Appears euvolemic at this time. -Please consult nephrology in a.m. for HD. -Resume renal meds

## 2022-03-29 NOTE — Assessment & Plan Note (Deleted)
Ulcer to big left toe with surrounding cellulitis, foul smell, with swelling, extending towards ankle.  X-ray negative for acute abnormality.  Meeting sepsis criteria with fever of 101, tachycardia heart rate 103.  Normal lactic acid 1.  WBC 9.6. -Ad[facility] to [facility], needs podiatry evaluation -Obtain MRI of the left foot -ESR, CRP -N.p.o. midnight - ABI left foot -Continue broad-spectrum antibiotics IV vancomycin and Zosyn

## 2022-03-29 NOTE — ED Provider Notes (Signed)
[facility] EMERGENCY DEPARTMENT Provider Note   CSN: [id number] Arrival date & time: 03/29/22  1137     History  Chief Complaint  Patient presents with   Wound Check   HPI [name] is a 37 y.o. female with ESRD, type 1 diabetes, hyperlipidemia and hypertension presenting for foot wound.  Noticed that her left great toe was swollen with a fluid-filled sac around it this past Thursday.  "The sac burst Thursday night and had a funky smell".  Patient endorses ongoing swelling, warmth and tenderness around the left great toe.  This states she is concerned the swelling is migrating up to her mid calf.  Maintains that she is still able to bear weight.  Denies oozing and bleeding.  Endorses normal sensation in her toe.  Denies fever and chills.  Was seen by wound care earlier today who advised her to come to the ER for further evaluation.  Wound Check       Home Medications Prior to Admission medications   Medication Sig Start Date End Date Taking? Authorizing Provider  ACCU-CHEK SOFTCLIX LANCETS lancets Use to check blood sugar 4 times per day. 12/29/15   Renato Shin, MD  acetaminophen (TYLENOL) 500 MG tablet Take 500-1,000 mg by mouth every 6 (six) hours as needed for moderate pain.    [provider]  calcium acetate (PHOSLO) 667 MG capsule Take 1,334 mg by mouth 3 (three) times daily with meals. Take 1-2 capsules (063-0160 mg) by mouth with snacks & take 2 capsules (1334 mg) by mouth with each meal 10/26/20   [provider]  Calcium Carbonate Antacid (CALCIUM CARBONATE, DOSED IN MG ELEMENTAL CALCIUM,) 1250 MG/5ML SUSP Take 500 mg of elemental calcium by mouth daily. 10/22/20   [provider]  cetirizine (ZYRTEC) 10 MG tablet Take 10 mg by mouth daily as needed for allergies.    [provider]  famotidine (PEPCID) 40 MG tablet Take 1 tablet (40 mg total) by mouth 2 (two) times daily as needed for heartburn or indigestion. 01/01/22   Jennye Boroughs, MD  fluticasone (FLONASE) 50 MCG/ACT nasal spray Place 2 sprays into both nostrils daily as needed for allergies. 09/03/18   [name], MD  gabapentin (NEURONTIN) 400 MG capsule Take 800 mg by mouth daily as needed (pain). 12/30/20   [provider]  hydrOXYzine (ATARAX/VISTARIL) 50 MG tablet Take 50 mg by mouth 2 (two) times daily as needed for itching. 11/03/20   [provider]  insulin aspart (NOVOLOG FLEXPEN) 100 UNIT/ML FlexPen Inject 0-15 Units into the skin in the morning, at noon, in the evening, and at bedtime. Sliding Scale insulin    [provider]  insulin degludec (TRESIBA) 100 UNIT/ML FlexTouch Pen Inject 5-10 Units into the skin daily. 01/01/22   Jennye Boroughs, MD  Insulin Pen Needle (BD PEN NEEDLE NANO 2ND GEN) 32G X 4 MM MISC 1 each by Does not apply route in the morning, at noon, in the evening, and at bedtime. 08/10/20   Barb Merino, MD  lidocaine-prilocaine (EMLA) cream Apply 1 application topically daily as needed [facility]). 12/17/20   [provider]  loperamide (IMODIUM) 2 MG capsule Take 2 mg by mouth as needed for diarrhea or loose stools. 09/11/20   [provider]  metoprolol tartrate (LOPRESSOR) 25 MG tablet Take 1 tablet (25 mg total) by mouth daily as needed (for systolic blood pressure or 135 or above). 01/01/22   Jennye Boroughs, MD  omeprazole (PRILOSEC) 40 MG  capsule Take 1 capsule (40 mg total) by mouth 2 (two) times daily before a meal. Open capsule and place granules in water or applesauce to swallow 03/17/21   Gatha Mayer, MD  ondansetron (ZOFRAN-ODT) 8 MG disintegrating tablet Take 1 tablet (8 mg total) by mouth every 8 (eight) hours as needed for nausea or vomiting. 01/01/22   Jennye Boroughs, MD  oxyCODONE-acetaminophen (PERCOCET/ROXICET) 5-325 MG tablet Take 1 tablet by mouth daily as needed for severe pain. 01/01/22   Jennye Boroughs, MD  RENVELA 2.4 g PACK Take 2.4 g by mouth 3 (three) times daily.  05/04/21   [provider]  silver sulfADIAZINE (SILVADENE) 1 % cream Apply 1 Application topically daily as needed (wound care). 01/01/22   Jennye Boroughs, MD  simvastatin (ZOCOR) 20 MG tablet Take 20 mg by mouth at bedtime.    [provider]  tiZANidine (ZANAFLEX) 2 MG tablet Take 2 mg by mouth 2 (two) times daily as needed for muscle spasms. 11/18/21   [provider]  trolamine salicylate (ASPERCREME/ALOE) 10 % cream Apply 1 application. topically as needed for muscle pain. 07/30/21   Blanchie Dessert, MD      Allergies    Diphenhydramine, Motrin [ibuprofen], Contrast media [iodinated contrast media], Peanut-containing drug products, Shellfish allergy, Banana, Chlorhexidine, Ferrous sulfate, and Iron dextran    Review of Systems   Review of Systems  Musculoskeletal:        Left great toe wound    Physical Exam Updated Vital Signs BP 111/86   Pulse (!) 103   Temp 99.7 F (37.6 C) (Oral)   Resp 16   Ht 5\' 6"  (1.676 m)   Wt 78.9 kg   SpO2 96%   BMI 28.08 kg/m  Physical Exam Vitals and nursing note reviewed.  HENT:     Head: Normocephalic and atraumatic.     Mouth/Throat:     Mouth: Mucous membranes are moist.  Eyes:     General:        Right eye: No discharge.        Left eye: No discharge.     Conjunctiva/sclera: Conjunctivae normal.  Cardiovascular:     Rate and Rhythm: Normal rate and regular rhythm.     Pulses: Normal pulses.     Heart sounds: Normal heart sounds.  Pulmonary:     Effort: Pulmonary effort is normal.     Breath sounds: Normal breath sounds.  Abdominal:     General: Abdomen is flat.     Palpations: Abdomen is soft.  Musculoskeletal:       Feet:     Comments: Swelling warmth and tenderness noted from the left great toe extending up to the mid left calf  Skin:    General: Skin is warm and dry.  Neurological:     General: No focal deficit present.  Psychiatric:        Mood and Affect: Mood normal.     ED Results /  Procedures / Treatments   Labs (all labs ordered are listed, but only abnormal results are displayed) Labs Reviewed  COMPREHENSIVE METABOLIC PANEL - Abnormal; Notable for the following components:      Result Value   Sodium 131 (*)    Potassium 5.5 (*)    Chloride 92 (*)    Glucose, Bld 242 (*)    BUN 39 (*)    Creatinine, Ser 9.44 (*)    Calcium 8.4 (*)    Total Protein 9.4 (*)  GFR, Estimated 5 (*)    All other components within normal limits  CBC WITH DIFFERENTIAL/PLATELET - Abnormal; Notable for the following components:   RBC 3.56 (*)    Hemoglobin 10.8 (*)    HCT 35.0 (*)    RDW 16.6 (*)    Neutro Abs 7.9 (*)    All other components within normal limits  CULTURE, BLOOD (ROUTINE X 2)  CULTURE, BLOOD (ROUTINE X 2)  RESP PANEL BY RT-PCR (RSV, FLU A&B, COVID)  RVPGX2  LACTIC ACID, PLASMA  PROTIME-INR  URINALYSIS, ROUTINE W REFLEX MICROSCOPIC  SEDIMENTATION RATE  C-REACTIVE PROTEIN    EKG None  Radiology DG Foot Complete Left  Result Date: 03/29/2022 CLINICAL DATA:  Foot pain, wound/ulcer. EXAM: LEFT FOOT - COMPLETE 3+ VIEW COMPARISON:  02/26/2021 FINDINGS: Minimal degenerative change of the first MTP joint and midfoot. No focal bone destruction to suggest osteomyelitis. Mild soft tissue swelling over the mid to forefoot. No air within the soft tissues. Small inferior calcaneal spur. IMPRESSION: 1. No acute findings. No evidence of osteomyelitis. 2. Mild soft tissue swelling. Electronically Signed   By: Marin Olp M.D.   On: 03/29/2022 14:58   DG Chest Port 1 View  Result Date: 03/29/2022 CLINICAL DATA:  Foot pain/ulcer. EXAM: PORTABLE CHEST 1 VIEW COMPARISON:  12/27/2021 FINDINGS: Lungs are hypoinflated and otherwise clear. Cardiomediastinal silhouette and remainder of the exam is unchanged. IMPRESSION: Hypoinflation without acute cardiopulmonary disease. Electronically Signed   By: Marin Olp M.D.   On: 03/29/2022 14:52    Procedures .Critical  Care  Performed by: Harriet Pho, PA-C Authorized by: Harriet Pho, PA-C   Critical care provider statement:    Critical care time (minutes):  30   Critical care was necessary to treat or prevent imminent or life-threatening deterioration of the following conditions:  Sepsis   Critical care was time spent personally by me on the following activities:  Development of treatment plan with patient or surrogate, discussions with consultants, evaluation of patient's response to treatment, examination of patient, ordering and review of laboratory studies, ordering and review of radiographic studies, ordering and performing treatments and interventions, pulse oximetry, re-evaluation of patient's condition and review of old charts     Medications Ordered in ED Medications  vancomycin (VANCOREADY) IVPB 1500 mg/300 mL (1,500 mg Intravenous New Bag/Given 03/29/22 1717)  vancomycin (VANCOREADY) IVPB 750 mg/150 mL (has no administration in time range)  HYDROcodone-acetaminophen (NORCO/VICODIN) 5-325 MG per tablet 1 tablet (1 tablet Oral Given 03/29/22 1728)  piperacillin-tazobactam (ZOSYN) 2.25 g in sodium chloride 0.9 % 50 mL IVPB (has no administration in time range)  piperacillin-tazobactam (ZOSYN) IVPB 3.375 g (3.375 g Intravenous New Bag/Given 03/29/22 1717)    ED Course/ Medical Decision Making/ A&P Clinical Course as of 03/29/22 1749  Mon Mar 29, 2022  1359 She has history of end-stage renal disease.  Sent here from wound clinic for wound on left foot.  Patient states this has been there a long time but has been getting worse.  She has a fever here although otherwise is not systemically ill.  Getting lab work and imaging.  Disposition per results of testing. [MB]    Clinical Course User Index [MB] Hayden Rasmussen, MD                             Medical Decision Making Amount and/or Complexity of Data Reviewed Labs: ordered. Radiology: ordered.  Risk Prescription drug  management. Decision regarding hospitalization.   Initial Impression and Ddx 37 year old female who is well-appearing and hemodynamically stable presenting for foot wound.  Physical exam is overall concerning for infection in the left great toe with with swelling extending into the medial left calf and fever.  Differential diagnosis for this complaint includes osteomyelitis, cellulitis, necrotizing fasciitis and sepsis. Interpretation of Diagnostics I independent reviewed and interpreted the labs as followed: Hyperkalemia, hyponatremia, or hyper glycemia, elevated BUN and creatinine, anemia  - I independently visualized the following imaging with scope of interpretation limited to determining acute life threatening conditions related to emergency care: X-ray of the left foot, which revealed mild soft tissue swelling.  Chest x-ray with hypoinflation but no acute cardiopulmonary process.  Patient Reassessment and Ultimate Disposition/Management Given fever, evidence of infection of the left great toe extending up the left calf in setting of ESRD and diabetes initial concern was sepsis related to osteomyelitis versus cellulitis.  Treated empirically for sepsis with vancomycin and Zosyn.  X-ray was negative for osteomyelitis but given the extent of skin breakdown and possible necrosis, I still had high suspicion for osteomyelitis.  This ultimately prompted admission to the hospital for sepsis evaluation in setting of possible osteomyelitis versus cellulitis.  Patient management required discussion with the following services or consulting groups:  Hospitalist Service  Complexity of Problems Addressed Acute complicated illness or Injury  Additional Data Reviewed and Analyzed Further history obtained from: Past medical history and medications listed in the EMR  Patient Encounter Risk Assessment Consideration of hospitalization         Final Clinical Impression(s) / ED Diagnoses Final  diagnoses:  Open wound of left foot, initial encounter    Rx / DC Orders ED Discharge Orders     None         Harriet Pho, PA-C 03/29/22 1749    Hayden Rasmussen, MD 03/29/22 Johnnye Lana

## 2022-03-29 NOTE — Assessment & Plan Note (Signed)
History of seizures/pseudo seizures.  She is not on medications.  She reports last seizure about a year ago.

## 2022-03-29 NOTE — ED Triage Notes (Addendum)
Pt presents with left foot ulcer, sent by wound clinic in [place] for evaluation, per pt ED at [facility] 24 hour wait. Initiated sepsis protocols. Dialysis Tu,Th, Sat.

## 2022-03-29 NOTE — Progress Notes (Addendum)
Pharmacy Antibiotic Note  [name] is a 37 y.o. female admitted on 03/29/2022 with cellulitis/left foot ulcer.  Pharmacy has been consulted for vancomycin dosing.  Plan: Vancomycin 1500 mg IV x 1 dose. Vancomycin 750 mg IV every TThSat w/HD Zosyn 3.375g IV x 1 dose, then 2.25gm IV q8h. Monitor labs, c/s, and vanco level as indicated.  Height: 5\' 6"  (167.6 cm) Weight: 78.9 kg (174 lb) IBW/kg (Calculated) : 59.3  Temp (24hrs), Avg:101 F (38.3 C), Min:101 F (38.3 C), Max:101 F (38.3 C)  Recent Labs  Lab 03/23/22 1500 03/29/22 1424  WBC 5.6 9.6  CREATININE 6.17* 9.44*  LATICACIDVEN  --  1.0    Estimated Creatinine Clearance: 8.6 mL/min (A) (by C-G formula based on SCr of 9.44 mg/dL (H)).    Allergies  Allergen Reactions   Diphenhydramine Anaphylaxis   Motrin [Ibuprofen] Shortness Of Breath and Itching   Contrast Media [Iodinated Contrast Media] Itching   Peanut-Containing Drug Products Itching    Able to tolerate when cooked in foods   Shellfish Allergy Hives   Banana Itching, Nausea And Vomiting and Other (See Comments)    Sick on the stomach   Chlorhexidine Itching   Ferrous Sulfate Itching   Iron Dextran Itching and Other (See Comments)    Vein irritation     Antimicrobials this admission: Vanco 1/15 >> Zosyn 1/15 >>  Microbiology results: 1/15 BCx: pending  Thank you for allowing pharmacy to be a part of this patient's care.  Margot Ables, PharmD Clinical Pharmacist 03/29/2022 4:20 PM  Addendum: Lajean Silvius 2.25gm IV q8h  Isac Sarna, BS Pharm D, BCPS Clinical Pharmacist

## 2022-03-29 NOTE — Assessment & Plan Note (Addendum)
Ulcer to big left toe with surrounding cellulitis, foul smell, with swelling, extending towards ankle.  X-ray negative for acute abnormality.  Meeting sepsis criteria with fever of 101, tachycardia heart rate 103.  Normal lactic acid 1.  WBC 9.6. -Admit to [facility], -Obtain MRI of the left foot -ESR, CRP -N.p.o. midnight - ABI left foot -Continue broad-spectrum antibiotics IV vancomycin and Zosyn -Please consult Dr. Jess Barters team in a.m. I have sent a secure chat to Dr. Sharol Given, I also called Ortho care on-call for Dr. Sharol Given team, no response.

## 2022-03-30 ENCOUNTER — Inpatient Hospital Stay (HOSPITAL_COMMUNITY): Payer: 59

## 2022-03-30 ENCOUNTER — Ambulatory Visit (HOSPITAL_BASED_OUTPATIENT_CLINIC_OR_DEPARTMENT_OTHER): Payer: Medicare Other | Admitting: General Surgery

## 2022-03-30 DIAGNOSIS — E10621 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: Secondary | ICD-10-CM | POA: Diagnosis not present

## 2022-03-30 DIAGNOSIS — L97529 Non-pressure chronic ulcer of other part of left foot with unspecified severity: Secondary | ICD-10-CM | POA: Diagnosis not present

## 2022-03-30 DIAGNOSIS — M869 Osteomyelitis, unspecified: Secondary | ICD-10-CM | POA: Insufficient documentation

## 2022-03-30 LAB — BASIC METABOLIC PANEL
Anion gap: 14 (ref 5–15)
BUN: 47 mg/dL — ABNORMAL HIGH (ref 6–20)
CO2: 23 mmol/L (ref 22–32)
Calcium: 7.8 mg/dL — ABNORMAL LOW (ref 8.9–10.3)
Chloride: 98 mmol/L (ref 98–111)
Creatinine, Ser: 11.03 mg/dL — ABNORMAL HIGH (ref 0.44–1.00)
GFR, Estimated: 4 mL/min — ABNORMAL LOW (ref >60)
Glucose, Bld: 148 mg/dL — ABNORMAL HIGH (ref 70–99)
Potassium: 4.9 mmol/L (ref 3.5–5.1)
Sodium: 135 mmol/L (ref 135–145)

## 2022-03-30 LAB — CBC
HCT: 29.5 % — ABNORMAL LOW (ref 36.0–46.0)
Hemoglobin: 9.1 g/dL — ABNORMAL LOW (ref 12.0–15.0)
MCH: 30.5 pg (ref 26.0–34.0)
MCHC: 30.8 g/dL (ref 30.0–36.0)
MCV: 99 fL (ref 80.0–100.0)
Platelets: 170 10*3/uL (ref 150–400)
RBC: 2.98 MIL/uL — ABNORMAL LOW (ref 3.87–5.11)
RDW: 16.7 % — ABNORMAL HIGH (ref 11.5–15.5)
WBC: 5.1 10*3/uL (ref 4.0–10.5)
nRBC: 0 % (ref 0.0–0.2)

## 2022-03-30 LAB — CBG MONITORING, ED
Glucose-Capillary: 174 mg/dL — ABNORMAL HIGH (ref 70–99)
Glucose-Capillary: 130 mg/dL — ABNORMAL HIGH (ref 70–99)
Glucose-Capillary: 247 mg/dL — ABNORMAL HIGH (ref 70–99)
Glucose-Capillary: 115 mg/dL — ABNORMAL HIGH (ref 70–99)
Glucose-Capillary: 174 mg/dL — ABNORMAL HIGH (ref 70–99)

## 2022-03-30 LAB — HEPATITIS B SURFACE ANTIGEN: Hepatitis B Surface Ag: NONREACTIVE

## 2022-03-30 MED ORDER — FAMOTIDINE 20 MG PO TABS
40.0000 mg | ORAL_TABLET | Freq: Every day | ORAL | Status: DC
  Administered 2022-03-30 – 2022-04-01 (×3): 40 mg via ORAL
  Filled 2022-03-30 (×3): qty 2

## 2022-03-30 MED ORDER — PENTAFLUOROPROP-TETRAFLUOROETH EX AERO: 1.0000 | INHALATION_SPRAY | CUTANEOUS | Status: DC | PRN

## 2022-03-30 MED ORDER — HEPARIN SODIUM (PORCINE) 5000 UNIT/ML IJ SOLN
5000.0000 [IU] | Freq: Three times a day (TID) | INTRAMUSCULAR | Status: DC
  Administered 2022-03-30 – 2022-04-04 (×13): 5000 [IU] via SUBCUTANEOUS
  Filled 2022-03-30 (×15): qty 1

## 2022-03-30 MED ORDER — HEPARIN SODIUM (PORCINE) 1000 UNIT/ML IJ SOLN
INTRAMUSCULAR | Status: AC
  Administered 2022-03-30: 1600 [IU] via INTRAVENOUS_CENTRAL
  Filled 2022-03-30: qty 4

## 2022-03-30 MED ORDER — GABAPENTIN 300 MG PO CAPS
600.0000 mg | ORAL_CAPSULE | Freq: Three times a day (TID) | ORAL | Status: DC
  Administered 2022-03-30 – 2022-04-04 (×12): 600 mg via ORAL
  Filled 2022-03-30 (×12): qty 2

## 2022-03-30 MED ORDER — INSULIN GLARGINE-YFGN 100 UNIT/ML ~~LOC~~ SOLN
8.0000 [IU] | Freq: Every day | SUBCUTANEOUS | Status: DC
  Administered 2022-03-30 – 2022-03-31 (×3): 8 [IU] via SUBCUTANEOUS
  Filled 2022-03-30 (×5): qty 0.08

## 2022-03-30 MED ORDER — PANTOPRAZOLE SODIUM 40 MG PO TBEC
40.0000 mg | DELAYED_RELEASE_TABLET | Freq: Every day | ORAL | Status: DC
  Administered 2022-03-30 – 2022-04-04 (×5): 40 mg via ORAL
  Filled 2022-03-30 (×5): qty 1

## 2022-03-30 MED ORDER — DOXERCALCIFEROL 4 MCG/2ML IV SOLN
INTRAVENOUS | Status: AC
  Administered 2022-03-30: 2 ug via INTRAVENOUS
  Filled 2022-03-30: qty 2

## 2022-03-30 MED ORDER — HYDROXYZINE HCL 25 MG PO TABS
50.0000 mg | ORAL_TABLET | Freq: Two times a day (BID) | ORAL | Status: DC | PRN
  Administered 2022-03-30 – 2022-04-03 (×3): 50 mg via ORAL
  Filled 2022-03-30 (×3): qty 2

## 2022-03-30 MED ORDER — HEPARIN SODIUM (PORCINE) 1000 UNIT/ML DIALYSIS: 20.0000 [IU]/kg | INTRAMUSCULAR | Status: DC | PRN

## 2022-03-30 MED ORDER — LIDOCAINE-PRILOCAINE 2.5-2.5 % EX CREA
TOPICAL_CREAM | CUTANEOUS | Status: AC
  Administered 2022-03-30: 1 via TOPICAL
  Filled 2022-03-30: qty 5

## 2022-03-30 MED ORDER — TIZANIDINE HCL 2 MG PO TABS
2.0000 mg | ORAL_TABLET | Freq: Two times a day (BID) | ORAL | Status: DC | PRN
  Administered 2022-04-02: 2 mg via ORAL
  Filled 2022-03-30: qty 1

## 2022-03-30 MED ORDER — SEVELAMER CARBONATE 2.4 G PO PACK: 2.4000 g | PACK | Freq: Three times a day (TID) | ORAL | Status: DC

## 2022-03-30 MED ORDER — FENTANYL CITRATE PF 50 MCG/ML IJ SOSY
12.5000 ug | PREFILLED_SYRINGE | Freq: Once | INTRAMUSCULAR | Status: AC | PRN
  Administered 2022-03-30: 12.5 ug via INTRAVENOUS
  Filled 2022-03-30: qty 1

## 2022-03-30 MED ORDER — SIMVASTATIN 20 MG PO TABS
20.0000 mg | ORAL_TABLET | Freq: Every day | ORAL | Status: DC
  Administered 2022-03-30 – 2022-04-03 (×5): 20 mg via ORAL
  Filled 2022-03-30 (×5): qty 1

## 2022-03-30 MED ORDER — METOPROLOL TARTRATE 25 MG PO TABS: 25.0000 mg | ORAL_TABLET | Freq: Every day | ORAL | Status: DC | PRN

## 2022-03-30 MED ORDER — LIDOCAINE-PRILOCAINE 2.5-2.5 % EX CREA: 1.0000 | TOPICAL_CREAM | CUTANEOUS | Status: DC | PRN

## 2022-03-30 MED ORDER — DOXERCALCIFEROL 4 MCG/2ML IV SOLN
2.0000 ug | INTRAVENOUS | Status: DC
  Administered 2022-04-01 – 2022-04-03 (×2): 2 ug via INTRAVENOUS
  Filled 2022-03-30 (×4): qty 2

## 2022-03-30 NOTE — Progress Notes (Addendum)
[name] ([id number]) [phone].pdf Page 1 of 9 Visit Report for 03/29/2022 Arrival Information Details Patient Name: Date of Service: [name] 03/29/2022 7:45 A M Medical Record Number: [id number] Patient Account Number: [id number] Date of Birth/Sex: Treating RN: [birth year] (37 y.o. [name] Primary [name] [name]: [name] Other Clinician: Referring [name]: Treating [name]/Extender: [name] in Treatment: 10 Visit Information History Since Last Visit Added or deleted any medications: No Patient Arrived: Ambulatory Any new allergies or adverse reactions: No Arrival Time: 08:12 Had a fall or experienced change in No Accompanied By: self activities of daily living that may affect Transfer Assistance: None risk of falls: Patient Requires Transmission-Based Precautions: No Signs or symptoms of abuse/neglect since last visito No Patient Has Alerts: No Hospitalized since last visit: No Implantable device outside of the clinic excluding No cellular tissue based products placed in the center since last visit: Has Dressing in Place as Prescribed: Yes Pain Present Now: Yes Electronic Signature(s) Signed: 03/30/2022 3:04:08 PM By: Valerie East RN Entered By: [name] on 03/29/2022 08:13:05 -------------------------------------------------------------------------------- Encounter Discharge Information Details Patient Name: Date of Service: [name], DA SHA Y S. 03/29/2022 7:45 A M Medical Record Number: [id number] Patient Account Number: [id number] Date of Birth/Sex: Treating RN: 03-24-1985 (37 y.o. [name] Primary [name] Rumi Kolodziej: [name] Other Clinician: Referring Rmani Kapusta: Treating Nylee Barbuto/Extender: [name] in Treatment: 10 Encounter Discharge Information Items Post Procedure Vitals Discharge Condition: Stable Temperature (F): 99.0 Ambulatory Status:  Ambulatory Pulse (bpm): 103 Discharge Destination: Home Respiratory Rate (breaths/min): 18 Transportation: Private Auto Blood Pressure (mmHg): 158/83 Accompanied By: self Schedule Follow-up Appointment: Yes Clinical Summary of [name]: Notes During today's clinical visit, the patients left great toe was debrided and necrotic tissue was found through the entirety of the Left great toe. Patient highly encouraged to go straight to the emergency room at [name] due to being a dialysis pt. Pt then states, "I do not want to go to the ED and wait". Pt was then educated that the possibility of developing sepsis was very likely if the toe was not treated. Electronic Signature(s) Signed: 03/29/2022 9:00:32 AM By: Valerie East RN Entered By: [name] on 03/29/2022 09:00:31 -------------------------------------------------------------------------------- Lower Extremity Assessment Details Patient Name: Date of Service: [name] 03/29/2022 7:45 A M Medical Record Number: [id number] Patient Account Number: [id number] Date of Birth/Sex: Treating RN: 1985/12/08 (37 y.o. [name] Primary [name] Fitzroy Mikami: [name] Other Clinician: Referring Patria Warzecha: Treating [name]/Extender: [name] in Treatment: [address], Julianne Handler ([id number]) [id number] Page 2 of 9 Edema Assessment Assessed: [Left: No] [Right: No] Edema: [Left: N] [Right: o] Calf Left: Right: Point of Measurement: From Medial Instep 38.5 cm Ankle Left: Right: Point of Measurement: From Medial Instep 23 cm Vascular Assessment Pulses: Dorsalis Pedis Palpable: [Left:Yes] Electronic Signature(s) Signed: 03/30/2022 3:04:08 PM By: Valerie East RN Entered By: [name] on 03/29/2022 08:16:59 -------------------------------------------------------------------------------- Multi Wound Chart Details Patient Name: Date of Service: [name], DA SHA Y S. 03/29/2022  7:45 A M Medical Record Number: [id number] Patient Account Number: [id number] Date of Birth/Sex: Treating RN: 07/15/1985 (37 y.o. F) Primary [name] Adamarie Izzo: [name] Other Clinician: Referring Ameliyah Sarno: Treating Denyse Fillion/Extender: [name] in Treatment: 10 Vital Signs Height(in): Capillary Blood Glucose(mg/dl): 124 Weight(lbs): 174 Pulse(bpm): 103 Body Mass Index(BMI): Blood Pressure(mmHg): 158/83 Temperature(F): 99.0 Respiratory Rate(breaths/min): 18 [1:Photos:] [3:No Photos] Left, Plantar Foot Left,  Plantar T Great oe Left, Medial T Great oe Wound Location: Gradually Appeared Laceration Trauma Wounding Event: Diabetic Wound/Ulcer of the Lower Abrasion Lesion Primary Etiology: Extremity Anemia, Hypertension, Type I Anemia, Hypertension, Type I N/A Comorbid History: Diabetes, End Stage Renal Disease, Diabetes, End Stage Renal Disease, Neuropathy, Confinement Anxiety Neuropathy, Confinement Anxiety 11/13/2021 02/10/2022 03/10/2022 Date Acquired: '10 5 2 '$ Weeks of Treatment: Open Open Open Wound Status: No No No Wound Recurrence: 1.6x1x0.2 0.5x0.6x0.1 1.6x0.6x0.1 Measurements L x W x D (cm) 1.257 0.236 0.754 A (cm) : rea 0.251 0.024 0.075 Volume (cm) : -77.80% -100.00% -539.00% % Reduction in Area: -18.40% -100.00% -525.00% % Reduction in Volume: Grade 1 Full Thickness Without Exposed Full Thickness Without Exposed Classification: Support Structures Support Structures Medium Small Medium Exudate Amount: Serosanguineous Serosanguineous Serosanguineous Exudate TypeKRISLYN, [name] ([id number]) [id number] Page 3 of 9 red, brown red, brown red, brown Exudate Color: Distinct, outline attached Distinct, outline attached N/A Wound Margin: Large (67-100%) Large (67-100%) N/A Granulation Amount: Red Red N/A Granulation Quality: Small (1-33%) Small (1-33%) N/A Necrotic Amount: Eschar, Adherent Slough  Adherent Slough N/A Necrotic Tissue: Fat Layer (Subcutaneous Tissue): Yes Fat Layer (Subcutaneous Tissue): Yes N/A Exposed Structures: Fascia: No Fascia: No Tendon: No Tendon: No Muscle: No Muscle: No Joint: No Joint: No Bone: No Bone: No Small (1-33%) None N/A Epithelialization: Debridement - Selective/Open Wound Debridement - Selective/Open Wound Debridement - Selective/Open Wound Debridement: Pre-procedure Verification/Time Out 08:27 08:27 08:27 Taken: Lidocaine 5% topical ointment Lidocaine 5% topical ointment Lidocaine 5% topical ointment Pain Control: Callus, Slough Callus, Slough Callus, Slough Tissue Debrided: Skin/Epidermis Skin/Epidermis Skin/Epidermis Level: 1.6 0.3 0.96 Debridement A (sq cm): rea Curette Curette Curette Instrument: Minimum Minimum Minimum Bleeding: Pressure Pressure Pressure Hemostasis A chieved: Procedure was tolerated well Procedure was tolerated well Procedure was tolerated well Debridement Treatment Response: 1.6x1x0.1 0.5x0.6x0.1 1.6x0.6x0.1 Post Debridement Measurements L x W x D (cm) 0.126 0.024 0.075 Post Debridement Volume: (cm) Callus: Yes Callus: Yes No Abnormalities Noted Periwound Skin Texture: Scarring: Yes No Abnormalities Noted No Abnormalities Noted No Abnormalities Noted Periwound Skin Moisture: No Abnormalities Noted No Abnormalities Noted No Abnormalities Noted Periwound Skin Color: No Abnormality No Abnormality N/A Temperature: Debridement Debridement Debridement Procedures Performed: Treatment Notes Wound #1 (Foot) Wound Laterality: Plantar, Left Cleanser Soap and Water Discharge Instruction: May shower and wash wound with dial antibacterial soap and water prior to dressing change. Wound Cleanser Discharge Instruction: Cleanse the wound with wound cleanser prior to applying a clean dressing using gauze sponges, not tissue or cotton balls. Peri-Wound [name] Topical Primary Dressing Hydrofera Blue Ready  Foam, 4x5 in Discharge Instruction: Apply to wound bed as instructed Secondary Dressing Optifoam Non-Adhesive Dressing, 4x4 in Discharge Instruction: Apply over primary dressing cut to make foam donut Woven Gauze Sponges 2x2 in Discharge Instruction: Apply over primary dressing as directed. Zetuvit Plus 4x8 in Discharge Instruction: Apply over primary dressing as directed. Secured With [employer], 4.5x3.1 (in/yd) Discharge Instruction: Secure with Kerlix as directed. 50M Medipore Soft Cloth Surgical T 2x10 (in/yd) ape Discharge Instruction: Secure with tape as directed. Compression Wrap Compression Stockings Add-Ons Wound #2 (Toe Great) Wound Laterality: Plantar, Left Cleanser Soap and Water Discharge Instruction: May shower and wash wound with dial antibacterial soap and water prior to dressing change. Wound Cleanser [name], [name] ([id number]) [phone].pdf Page 4 of 9 Discharge Instruction: Cleanse the wound with wound cleanser prior to applying a clean dressing using gauze sponges, not tissue or cotton balls. Peri-Wound [name] Topical Primary Dressing Hydrofera Blue Ready  Foam, 4x5 in Discharge Instruction: Apply to wound bed as instructed Secondary Dressing Optifoam Non-Adhesive Dressing, 4x4 in Discharge Instruction: Apply over primary dressing cut to make foam donut Woven Gauze Sponge, Non-Sterile 4x4 in Discharge Instruction: Apply over primary dressing as directed. Woven Gauze Sponges 2x2 in Discharge Instruction: Apply over primary dressing as directed. Secured With [employer], 4.5x3.1 (in/yd) Discharge Instruction: Secure with Kerlix as directed. 61M Medipore Soft Cloth Surgical T 2x10 (in/yd) ape Discharge Instruction: Secure with tape as directed. Compression Wrap Compression Stockings Add-Ons Wound #3 (Toe Great) Wound Laterality: Left, Medial Cleanser Soap and Water Discharge Instruction: May shower and wash wound  with dial antibacterial soap and water prior to dressing change. Wound Cleanser Discharge Instruction: Cleanse the wound with wound cleanser prior to applying a clean dressing using gauze sponges, not tissue or cotton balls. Peri-Wound [name] Topical Primary Dressing Hydrofera Blue Ready Foam, 4x5 in Discharge Instruction: Apply to wound bed as instructed Secondary Dressing Optifoam Non-Adhesive Dressing, 4x4 in Discharge Instruction: Apply over primary dressing cut to make foam donut Woven Gauze Sponge, Non-Sterile 4x4 in Discharge Instruction: Apply over primary dressing as directed. Woven Gauze Sponges 2x2 in Discharge Instruction: Apply over primary dressing as directed. Secured With [employer], 4.5x3.1 (in/yd) Discharge Instruction: Secure with Kerlix as directed. 61M Medipore Soft Cloth Surgical T 2x10 (in/yd) ape Discharge Instruction: Secure with tape as directed. Compression Wrap Compression Stockings Add-Ons Electronic Signature(s) Signed: 03/29/2022 9:27:14 AM By: Fredirick Maudlin MD FACS Entered By: Fredirick Maudlin on 03/29/2022 09:27:14 [name] ([id number]) [id number] Page 5 of 9 -------------------------------------------------------------------------------- Multi-Disciplinary [name] Plan Details Patient Name: Date of Service: [name] 03/29/2022 7:45 A M Medical Record Number: [id number] Patient Account Number: [id number] Date of Birth/Sex: Treating RN: 09-03-1985 (37 y.o. [name] Primary [name] Kiesha Ensey: [name] Other Clinician: Referring Rosalita Carey: Treating Azoria Abbett/Extender: [name] in Treatment: 10 Multidisciplinary [name] Plan reviewed with physician Active Inactive Electronic Signature(s) Signed: 04/17/2022 8:25:36 AM By: Deon Pilling RN, BSN Signed: 05/10/2022 4:55:53 PM By: Valerie East RN Previous Signature: 03/29/2022 8:50:51 AM Version By: Valerie East  RN Entered By: Deon Pilling on 04/17/2022 08:25:35 -------------------------------------------------------------------------------- Pain Assessment Details Patient Name: Date of Service: Valerie Piano Y S. 03/29/2022 7:45 A M Medical Record Number: [id number] Patient Account Number: [id number] Date of Birth/Sex: Treating RN: July 11, 1985 (37 y.o. [name] Primary [name] Jermarion Poffenberger: [name] Other Clinician: Referring Donnia Poplaski: Treating Nurah Petrides/Extender: [name] in Treatment: 10 Active Problems Location of Pain Severity and Description of Pain Patient Has Paino Yes Site Locations Pain Location: Pain in Ulcers Rate the pain. Current Pain Level: 10 Character of Pain Describe the Pain: Stabbing Pain Management and Medication Current Pain Management: Electronic Signature(s) Signed: 03/30/2022 3:04:08 PM By: Valerie East RN Entered By: [name] on 03/29/2022 08:13:55 -------------------------------------------------------------------------------- Patient/Caregiver Education Details Patient Name: Date of Service: [name], DA SHA Y Valerie Cruel 1/15/2024andnbsp7:45 A M [name], Janith S ([id number]) [id number] Page 6 of 9 Medical Record Number: [id number] Patient Account Number: [id number] Date of Birth/Gender: Treating RN: 07/12/1985 (37 y.o. [name] Primary [name] Physician: [name] Other Clinician: Referring Physician: Treating Physician/Extender: [name] in Treatment: 10 Education Assessment Education Provided To: Patient Education Topics Provided Infection: Methods: Explain/Verbal Responses: Reinforcements needed, State content correctly Wound Debridement: Methods: Explain/Verbal Responses: Reinforcements needed, State content correctly Wound/Skin Impairment: Methods: Explain/Verbal Responses: Reinforcements needed, State content correctly Electronic  Signature(s) Signed:  03/30/2022 3:04:08 PM By: Valerie East RN Entered By: [name] on 03/29/2022 08:51:15 -------------------------------------------------------------------------------- Wound Assessment Details Patient Name: Date of Service: Valerie Piano Y S. 03/29/2022 7:45 A M Medical Record Number: [id number] Patient Account Number: [id number] Date of Birth/Sex: Treating RN: [birth year] (37 y.o. [name] Primary [name] Nyree Applegate: [name] Other Clinician: Referring [place] Scheiderer: Treating Kayelyn Lemon/Extender: [name] in Treatment: 10 Wound Status Wound Number: 1 Primary Diabetic Wound/Ulcer of the Lower Extremity Etiology: Wound Location: Left, Plantar Foot Wound Open Wounding Event: Gradually Appeared Status: Date Acquired: 11/13/2021 Comorbid Anemia, Hypertension, Type I Diabetes, End Stage Renal Weeks Of Treatment: 10 History: Disease, Neuropathy, Confinement Anxiety Clustered Wound: No Photos Wound Measurements Length: (cm) 1.6 Width: (cm) 1 Depth: (cm) 0.2 Area: (cm) 1.257 Volume: (cm) 0.251 % Reduction in Area: -77.8% % Reduction in Volume: -18.4% Epithelialization: Small (1-33%) Tunneling: No Undermining: No Wound Description [name], [name] ([id number]) Classification: Grade 1 Wound Margin: Distinct, outline attached Exudate Amount: Medium Exudate Type: Serosanguineous Exudate Color: red, brown [id number] Page 7 of 9 Foul Odor After Cleansing: No Slough/Fibrino Yes Wound Bed Granulation Amount: Large (67-100%) Exposed Structure Granulation Quality: Red Fascia Exposed: No Necrotic Amount: Small (1-33%) Fat Layer (Subcutaneous Tissue) Exposed: Yes Necrotic Quality: Eschar, Adherent Slough Tendon Exposed: No Muscle Exposed: No Joint Exposed: No Bone Exposed: No Periwound Skin Texture Texture Color No Abnormalities Noted: No No Abnormalities Noted: Yes Callus: Yes Temperature /  Pain Scarring: Yes Temperature: No Abnormality Moisture No Abnormalities Noted: Yes Electronic Signature(s) Signed: 03/30/2022 3:04:08 PM By: Valerie East RN Entered By: [name] on 03/29/2022 08:21:48 -------------------------------------------------------------------------------- Wound Assessment Details Patient Name: Date of Service: Valerie Piano Y S. 03/29/2022 7:45 A M Medical Record Number: [id number] Patient Account Number: [id number] Date of Birth/Sex: Treating RN: 09-Apr-1985 (37 y.o. [name] Primary [name] Reco Shonk: [name] Other Clinician: Referring Gracelin Weisberg: Treating Sevrin Sally/Extender: [name] in Treatment: 10 Wound Status Wound Number: 2 Primary Abrasion Etiology: Wound Location: Left, Plantar T Great oe Wound Open Wounding Event: Laceration Status: Date Acquired: 02/10/2022 Comorbid Anemia, Hypertension, Type I Diabetes, End Stage Renal Weeks Of Treatment: 5 History: Disease, Neuropathy, Confinement Anxiety Clustered Wound: No Photos Wound Measurements Length: (cm) 0.5 Width: (cm) 0.6 Depth: (cm) 0.1 Area: (cm) 0.236 Volume: (cm) 0.024 % Reduction in Area: -100% % Reduction in Volume: -100% Epithelialization: None Tunneling: No Undermining: No Wound Description Classification: Full Thickness Without Exposed Support Structures Wound Margin: Distinct, outline attached Exudate Amount: Small [name], [name] ([id number]) Exudate Type: Serosanguineous Exudate Color: red, brown Foul Odor After Cleansing: No Slough/Fibrino Yes [phone].pdf Page 8 of 9 Wound Bed Granulation Amount: Large (67-100%) Exposed Structure Granulation Quality: Red Fascia Exposed: No Necrotic Amount: Small (1-33%) Fat Layer (Subcutaneous Tissue) Exposed: Yes Necrotic Quality: Adherent Slough Tendon Exposed: No Muscle Exposed: No Joint Exposed: No Bone Exposed: No Periwound Skin Texture Texture  Color No Abnormalities Noted: No No Abnormalities Noted: Yes Callus: Yes Temperature / Pain Temperature: No Abnormality Moisture No Abnormalities Noted: Yes Electronic Signature(s) Signed: 03/30/2022 3:04:08 PM By: Valerie East RN Entered By: [name] on 03/29/2022 08:22:08 -------------------------------------------------------------------------------- Wound Assessment Details Patient Name: Date of Service: Valerie Piano Y S. 03/29/2022 7:45 A M Medical Record Number: [id number] Patient Account Number: [id number] Date of Birth/Sex: Treating RN: 1985/08/10 (37 y.o. [name] Primary [name] Damieon Armendariz: [name] Other Clinician: Referring Aily Tzeng: Treating Arliss Frisina/Extender: [name] in Treatment: 10 Wound Status  Wound Number: 3 Primary Etiology: Lesion Wound Location: Left, Medial T Great oe Wound Status: Open Wounding Event: Trauma Notes: Pt. pulled off "dead skin" from her Left Great toe Date Acquired: 03/10/2022 Weeks Of Treatment: 2 Clustered Wound: No Wound Measurements Length: (cm) 1.6 Width: (cm) 0.6 Depth: (cm) 0.1 Area: (cm) 0.754 Volume: (cm) 0.075 % Reduction in Area: -539% % Reduction in Volume: -525% Wound Description Classification: Full Thickness Without Exposed Su Exudate Amount: Medium Exudate Type: Serosanguineous Exudate Color: red, brown pport Structures Periwound Skin Texture Texture Color No Abnormalities Noted: No No Abnormalities Noted: No Moisture No Abnormalities Noted: No Electronic Signature(s) Signed: 03/30/2022 3:04:08 PM By: Valerie East RN Entered By: [name] on 03/29/2022 08:18:43 Vitals Details -------------------------------------------------------------------------------- [name] ([id number]) [id number] Page 9 of 9 Patient Name: Date of Service: [name] 03/29/2022 7:45 A M Medical Record Number: [id number] Patient Account Number:  [id number] Date of Birth/Sex: Treating RN: Dec 08, 1985 (37 y.o. [name] Primary [name] Rhea Kaelin: [name] Other Clinician: Referring Mattison Golay: Treating Terril Amaro/Extender: [name] in Treatment: 10 Vital Signs Time Taken: 08:13 Temperature (F): 99.0 Weight (lbs): 174 Pulse (bpm): 103 Respiratory Rate (breaths/min): 18 Blood Pressure (mmHg): 158/83 Capillary Blood Glucose (mg/dl): 124 Reference Range: 80 - 120 mg / dl Electronic Signature(s) Signed: 03/30/2022 3:04:08 PM By: Valerie East RN Entered By: [name] on 03/29/2022 08:13:41

## 2022-03-30 NOTE — Congregational Nurse Program (Unsigned)
[name] stated she is having trouble with her foot.She is in the ED related to infection. She is a diabetic and on dialysis. 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 I referred to [employer] for assistance

## 2022-03-30 NOTE — Procedures (Signed)
   HEMODIALYSIS TREATMENT NOTE:  3.25 hour low heparin HD completed using LUE AVG.  High VPs warranted lowering of Qb.  Goal met: 1 liter removed.  Treatment was shortened after d/w Dr Moshe Cipro as [facility] is ready to transport pt to ready room on [facility].  All blood was returned.  Meds given:  Zosyn, Vancomycin, Hectorol, Norco  Report was called to Aliene Beams, RN (in ED)    LAB RESULTS - Infectious Diseases - serologies obtained from [facility] Infectious Diseases Result Type Result Value Relevant Reference Range Interpretation Date  Hep B core Ab Total (anti-HBc) Negative No Reference Range Provided - October 03, 2021  HCV Ab (anti-HCV) Nonreactive No Reference Range Provided - October 29, 2021  Hep B Surface Ab (anti-HBs) 129 mIU/mL No Reference Range Provided - March 04, 2022  Hep B Surface Ag (HBsAg) Negative No Reference Range Provided - March 04, 2022     Rockwell Alexandria, RN AP [facility]

## 2022-03-30 NOTE — ED Notes (Signed)
Patient needed a complete lien change. Complete lien change and new warm blankets given. Nurse aware.

## 2022-03-30 NOTE — ED Notes (Signed)
Patient off unit to dialysis at this time.  

## 2022-03-30 NOTE — Consult Note (Signed)
Reason for Consult: To manage dialysis and dialysis related needs Referring Physician: Ranelle Auker is an 37 y.o. female with DM and ESRD ( TTS)  pt had trauma to her fool in November resulting in an open wound had been followed by the wound care clinic- presented to [facility] ER yesterday for worsening pain and swelling in the foot-  new open wound-  she was febrile-  xray of foot negative-  she is being admitted for IV abx-  we are asked to provide her HD   Dialyzes at [facility]-  TTS 4 hours  EDW 79. HD Bath 2K/3calc, Dialyzer 180, Heparin yes-  4000 then 2500 mid. Access AVG-  16 gauge - 300 BFR.  Last HD Sat-  left out 80.6-  likely needs EDW up-  last hgb 10.8, phos 6.2, pth 89  hect 2 q tx, mircera 30  Past Medical History:  Diagnosis Date   Anemia    Blood transfusion without reported diagnosis    Cellulitis of left leg 03/01/2018   Chronic kidney disease    kidney transplant 07   Diabetes mellitus    Pt reports diagnosis in June 2011, Type 2   Diabetes mellitus without complication ([place])    Esophageal obstruction due to food impaction    GERD (gastroesophageal reflux disease)    Hyperlipidemia    Hypertension    Intra-abdominal abscess ([place]) 10/28/2018   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   LEARNING DISABILITY 09/25/2007   Qualifier: Diagnosis of  By: Deborra Medina MD, Talia     Nausea and vomiting    Prolonged Q-T interval on ECG    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   Pyelonephritis 06/23/2014   Renal and perinephric abscess 11/01/2018   Renal disorder    Seasonal allergies    Seizures ([place])    UTI (urinary tract infection) 01/09/2015   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   Qualifier: Diagnosis of  By: Carlena Sax  MD, Colletta [place]      Past Surgical History:  Procedure Laterality Date   ARTERIOVENOUS GRAFT PLACEMENT Bilateral    "neither work" (10/24/2017)   AV FISTULA PLACEMENT Left 10/26/2018   Procedure: CREATION OF ARTERIOVENOUS FISTULA  LEFT ARM;  Surgeon: [name], MD;  Location: [facility];  Service: Vascular;  Laterality: Left;   AV FISTULA PLACEMENT Left 05/23/2020   Procedure: LEFT ARM ARTERIOVENOUS (AV) FISTULA CREATION;  Surgeon: [name], MD;  Location: [facility];  Service: Vascular;  Laterality: Left;   BALLOON DILATION N/Valerie 01/19/2021   Procedure: Larrie Kass DILATION;  Surgeon: Gatha Mayer, MD;  [facility];  Service: Endoscopy;  Laterality: N/Valerie;   BALLOON DILATION N/Valerie 02/10/2021   Procedure: BALLOON DILATION;  Surgeon: Irene Shipper, MD;  [facility];  Service: Endoscopy;  Laterality: N/Valerie;   BALLOON DILATION N/Valerie 02/19/2021   Procedure: BALLOON DILATION;  Surgeon: Sharyn Creamer, MD;  [facility];  Service: Gastroenterology;  Laterality: N/Valerie;   BASCILIC VEIN TRANSPOSITION Left 12/21/2018   Procedure: Left arm BASILIC VEIN TRANSPOSITION SECOND STAGE;  Surgeon: [name], MD;  Location: [facility];  Service: Vascular;  Laterality: Left;   BIOPSY  12/29/2021   Procedure: BIOPSY;  Surgeon: Ladene Artist, MD;  Location: [place];  Service: Gastroenterology;;   CHOLECYSTECTOMY N/Valerie 06/30/2017   Procedure: LAPAROSCOPIC CHOLECYSTECTOMY WITH INTRAOPERATIVE CHOLANGIOGRAM;  Surgeon: Excell Seltzer, MD;  [facility];  Service: General;  Laterality: N/Valerie;   ESOPHAGOGASTRODUODENOSCOPY (EGD) WITH PROPOFOL N/Valerie 07/04/2017  Procedure: ESOPHAGOGASTRODUODENOSCOPY (EGD) WITH PROPOFOL;  Surgeon: Clarene Essex, MD;  [facility];  Service: Endoscopy;  Laterality: N/Valerie;   ESOPHAGOGASTRODUODENOSCOPY (EGD) WITH PROPOFOL N/Valerie 08/10/2020   Procedure: ESOPHAGOGASTRODUODENOSCOPY (EGD) WITH PROPOFOL;  Surgeon: Doran Stabler, MD;  Location: [facility];  Service: Gastroenterology;  Laterality: N/Valerie;   ESOPHAGOGASTRODUODENOSCOPY (EGD) WITH PROPOFOL N/Valerie 01/19/2021   Procedure: ESOPHAGOGASTRODUODENOSCOPY (EGD) WITH PROPOFOL;  Surgeon: Gatha Mayer, MD;  [facility];  Service: Endoscopy;  Laterality: N/Valerie;   WITH FLUOROSCOPY AND DILATION   ESOPHAGOGASTRODUODENOSCOPY (EGD) WITH PROPOFOL N/Valerie 02/03/2021   Procedure: ESOPHAGOGASTRODUODENOSCOPY (EGD) WITH PROPOFOL;  Surgeon: Gatha Mayer, MD;  [facility];  Service: Endoscopy;  Laterality: N/Valerie;   ESOPHAGOGASTRODUODENOSCOPY (EGD) WITH PROPOFOL N/Valerie 02/10/2021   Procedure: ESOPHAGOGASTRODUODENOSCOPY (EGD) WITH PROPOFOL;  Surgeon: Irene Shipper, MD;  [facility];  Service: Endoscopy;  Laterality: N/Valerie;  Balloon Dilation   ESOPHAGOGASTRODUODENOSCOPY (EGD) WITH PROPOFOL N/Valerie 02/19/2021   Procedure: ESOPHAGOGASTRODUODENOSCOPY (EGD) WITH PROPOFOL;  Surgeon: Sharyn Creamer, MD;  [facility];  Service: Gastroenterology;  Laterality: N/Valerie;   ESOPHAGOGASTRODUODENOSCOPY (EGD) WITH PROPOFOL N/Valerie 03/12/2021   Procedure: ESOPHAGOGASTRODUODENOSCOPY (EGD) WITH PROPOFOL;  Surgeon: Lavena Bullion, DO;  [facility];  Service: Gastroenterology;  Laterality: N/Valerie;   ESOPHAGOGASTRODUODENOSCOPY (EGD) WITH PROPOFOL N/Valerie 12/29/2021   Procedure: ESOPHAGOGASTRODUODENOSCOPY (EGD) WITH PROPOFOL;  Surgeon: Ladene Artist, MD;  Location: [facility];  Service: Gastroenterology;  Laterality: N/Valerie;   FLEXIBLE SIGMOIDOSCOPY N/Valerie 12/29/2021   Procedure: FLEXIBLE SIGMOIDOSCOPY;  Surgeon: Ladene Artist, MD;  Location: [place];  Service: Gastroenterology;  Laterality: N/Valerie;   FOREIGN BODY REMOVAL N/Valerie 02/03/2021   Procedure: FOREIGN BODY REMOVAL;  Surgeon: Gatha Mayer, MD;  [facility];  Service: Endoscopy;  Laterality: N/Valerie;   INSERTION OF DIALYSIS CATHETER N/Valerie 03/20/2018   Procedure: INSERTION OF TUNNELED DIALYSIS CATHETER - RIGHT INTERANL JUGULAR PLACEMENT;  Surgeon: Angelia Mould, MD;  Location: [facility];  Service: Vascular;  Laterality: N/Valerie;   IR FLUORO GUIDE CV LINE RIGHT  04/18/2020   IR GUIDED DRAIN W CATHETER PLACEMENT  10/28/2018   KIDNEY TRANSPLANT  2007   KIDNEY TRANSPLANT Right    PARATHYROIDECTOMY  ?2012   "3/4  removed" (10/24/2017)   RENAL BIOPSY Bilateral 2003   REVISON OF ARTERIOVENOUS FISTULA Left 09/24/2020   Procedure: LEFT UPPER ARM ARTERIOVENOUS GRAFT CREATION;  Surgeon: [name], MD;  Location: [facility];  Service: Vascular;  Laterality: Left;   UPPER EXTREMITY VENOGRAPHY Bilateral 10/19/2018   Procedure: UPPER EXTREMITY VENOGRAPHY;  Surgeon: [name], MD;  Location: [facility];  Service: Cardiovascular;  Laterality: Bilateral;  Bilateral    UPPER EXTREMITY VENOGRAPHY Left 09/04/2020   Procedure: UPPER EXTREMITY VENOGRAPHY - Left Upper;  Surgeon: [name], MD;  Location: [facility];  Service: Cardiovascular;  Laterality: Left;    Family History  Problem Relation Age of Onset   Arthritis Mother    Hypertension Mother    Aneurysm Mother        died of brain aneurysm   CAD Father        Has 3 stents   Diabetes Father        borderline   Early death Brother        Died in war   Colon cancer Neg Hx    Esophageal cancer Neg Hx    Rectal cancer Neg Hx    Stomach cancer Neg Hx     Social History:  reports that she has never smoked. She has never been exposed to tobacco smoke. She has never used smokeless tobacco. She reports that she does not currently use alcohol. She reports that she does not use drugs.  Allergies:  Allergies  Allergen Reactions   Diphenhydramine Anaphylaxis   Motrin [Ibuprofen] Shortness Of Breath and Itching   Contrast Media [Iodinated Contrast Media] Itching   Peanut-Containing Drug Products Itching    Able to tolerate when cooked in foods   Shellfish Allergy Hives   Banana Itching, Nausea And Vomiting and Other (See Comments)    Sick on the stomach   Chlorhexidine Itching   Ferrous Sulfate Itching   Iron Dextran Itching and Other (See Comments)    Vein irritation     Medications: I have reviewed the patient's current medications.  Results for orders placed or performed during the hospital encounter of  03/29/22 (from the past 48 hour(s))  Sedimentation rate     Status: Abnormal   Collection Time: 03/29/22  2:22 PM  Result Value Ref Range   Sed Rate 100 (H) 0 - 22 mm/hr    Comment: Performed at [facility], [address] [place], [place] [address]  C-reactive protein     Status: Abnormal   Collection Time: 03/29/22  2:22 PM  Result Value Ref Range   CRP 19.2 (H) <1.0 mg/dL    Comment: Performed at [facility], [place] [address], [place], [place] [address]  Comprehensive metabolic panel     Status: Abnormal   Collection Time: 03/29/22  2:24 PM  Result Value Ref Range   Sodium 131 (L) 135 - 145 mmol/L   Potassium 5.5 (H) 3.5 - 5.1 mmol/L   Chloride 92 (L) 98 - 111 mmol/L   CO2 25 22 - 32 mmol/L   Glucose, Bld 242 (H) 70 - 99 mg/dL    Comment: Glucose reference range applies only to samples taken after fasting for at least 8 hours.   BUN 39 (H) 6 - 20 mg/dL   Creatinine, Ser 9.44 (H) 0.44 - 1.00 mg/dL   Calcium 8.4 (L) 8.9 - 10.3 mg/dL   Total Protein 9.4 (H) 6.5 - 8.1 g/dL   Albumin 3.9 3.5 - 5.0 g/dL   AST 15 15 - 41 U/L   ALT 11 0 - 44 U/L   Alkaline Phosphatase 59 38 - 126 U/L   Total Bilirubin 0.6 0.3 - 1.2 mg/dL   GFR, Estimated 5 (L) >60 mL/min    Comment: (NOTE) Calculated using the CKD-EPI Creatinine Equation (2021)    Anion gap 14 5 - 15    Comment: Performed at [facility], [address] [place], [place] [address]  Lactic acid, plasma     Status: None   Collection Time: 03/29/22  2:24 PM  Result Value Ref Range   Lactic Acid, Venous 1.0 0.5 - 1.9 mmol/L    Comment: Performed at [facility], [address], [place], [place] [address]  CBC with Differential     Status: Abnormal   Collection Time: 03/29/22  2:24 PM  Result Value Ref Range   WBC 9.6 4.0 - 10.5 K/uL   RBC 3.56 (L) 3.87 - 5.11 MIL/uL   Hemoglobin 10.8 (L) 12.0 - 15.0 g/dL   HCT 35.0 (L) 36.0 - 46.0 %   MCV 98.3 80.0 - 100.0 fL   MCH 30.3 26.0 - 34.0 pg   MCHC 30.9 30.0 - 36.0 g/dL   RDW  16.6 (H) 11.5 -  15.5 %   Platelets 221 150 - 400 K/uL   nRBC 0.0 0.0 - 0.2 %   Neutrophils Relative % 81 %   Neutro Abs 7.9 (H) 1.7 - 7.7 K/uL   Lymphocytes Relative 12 %   Lymphs Abs 1.1 0.7 - 4.0 K/uL   Monocytes Relative 3 %   Monocytes Absolute 0.3 0.1 - 1.0 K/uL   Eosinophils Relative 3 %   Eosinophils Absolute 0.3 0.0 - 0.5 K/uL   Basophils Relative 1 %   Basophils Absolute 0.1 0.0 - 0.1 K/uL   Immature Granulocytes 0 %   Abs Immature Granulocytes 0.02 0.00 - 0.07 K/uL    Comment: Performed at [facility], [address], [place], [place] [address]  Protime-INR     Status: None   Collection Time: 03/29/22  2:24 PM  Result Value Ref Range   Prothrombin Time 13.8 11.4 - 15.2 seconds   INR 1.1 0.8 - 1.2    Comment: (NOTE) INR goal varies based on device and disease states. Performed at [facility], [address], [place], [place] [address]   Culture, blood (Routine x 2)     Status: None (Preliminary result)   Collection Time: 03/29/22  2:24 PM   Specimen: BLOOD RIGHT HAND  Result Value Ref Range   Specimen Description BLOOD RIGHT HAND    Special Requests      BOTTLES DRAWN AEROBIC AND ANAEROBIC Blood Culture adequate volume   Culture      NO GROWTH < 24 HOURS Performed at [facility], [address] [place], [place] [address]    Report Status PENDING   Culture, blood (Routine x 2)     Status: None (Preliminary result)   Collection Time: 03/29/22  2:24 PM   Specimen: BLOOD RIGHT ARM  Result Value Ref Range   Specimen Description BLOOD RIGHT ARM    Special Requests      BOTTLES DRAWN AEROBIC AND ANAEROBIC Blood Culture adequate volume   Culture      NO GROWTH < 24 HOURS Performed at [facility], [address] [place], [place] [address]    Report Status PENDING   Resp panel by RT-PCR (RSV, Flu Valerie&B, Covid) Anterior Nasal Swab     Status: None   Collection Time: 03/29/22  8:01 PM   Specimen: Anterior Nasal Swab  Result Value Ref Range   SARS Coronavirus 2 by RT  PCR NEGATIVE NEGATIVE    Comment: (NOTE) SARS-CoV-2 target nucleic acids are NOT DETECTED.  The SARS-CoV-2 RNA is generally detectable in upper respiratory specimens during the acute phase of infection. The lowest concentration of SARS-CoV-2 viral copies this assay can detect is 138 copies/mL. Valerie negative result does not preclude SARS-Cov-2 infection and should not be used as the sole basis for treatment or other patient management decisions. Valerie negative result may occur with  improper specimen collection/handling, submission of specimen other than nasopharyngeal swab, presence of viral mutation(s) within the areas targeted by this assay, and inadequate number of viral copies(<138 copies/mL). Valerie negative result must be combined with clinical observations, patient history, and epidemiological information. The expected result is Negative.  Fact Sheet for Patients:  EntrepreneurPulse.com.au  Fact Sheet for Healthcare Providers:  IncredibleEmployment.be  This test is no t yet approved or cleared by the [place] FDA and  has been authorized for detection and/or diagnosis of SARS-CoV-2 by FDA under an Emergency Use Authorization (EUA). This EUA will remain  in effect (meaning this test can be used)  for the duration of the COVID-19 declaration under Section 564(b)(1) of the Act, 21 U.S.C.section 360bbb-3(b)(1), unless the authorization is terminated  or revoked sooner.       Influenza Valerie by PCR NEGATIVE NEGATIVE   Influenza B by PCR NEGATIVE NEGATIVE    Comment: (NOTE) The Xpert Xpress SARS-CoV-2/FLU/RSV plus assay is intended as an aid in the diagnosis of influenza from Nasopharyngeal swab specimens and should not be used as Valerie sole basis for treatment. Nasal washings and aspirates are unacceptable for Xpert Xpress SARS-CoV-2/FLU/RSV testing.  Fact Sheet for Patients: EntrepreneurPulse.com.au  Fact Sheet for Healthcare  Providers: IncredibleEmployment.be  This test is not yet approved or cleared by the [place] FDA and has been authorized for detection and/or diagnosis of SARS-CoV-2 by FDA under an Emergency Use Authorization (EUA). This EUA will remain in effect (meaning this test can be used) for the duration of the COVID-19 declaration under Section 564(b)(1) of the Act, 21 U.S.C. section 360bbb-3(b)(1), unless the authorization is terminated or revoked.     Resp Syncytial Virus by PCR NEGATIVE NEGATIVE    Comment: (NOTE) Fact Sheet for Patients: EntrepreneurPulse.com.au  Fact Sheet for Healthcare Providers: IncredibleEmployment.be  This test is not yet approved or cleared by the [place] FDA and has been authorized for detection and/or diagnosis of SARS-CoV-2 by FDA under an Emergency Use Authorization (EUA). This EUA will remain in effect (meaning this test can be used) for the duration of the COVID-19 declaration under Section 564(b)(1) of the Act, 21 U.S.C. section 360bbb-3(b)(1), unless the authorization is terminated or revoked.  Performed at [facility], [address], [place], [place] [address]   CBG monitoring, ED     Status: Abnormal   Collection Time: 03/29/22  8:04 PM  Result Value Ref Range   Glucose-Capillary 376 (H) 70 - 99 mg/dL    Comment: Glucose reference range applies only to samples taken after fasting for at least 8 hours.  CBG monitoring, ED     Status: Abnormal   Collection Time: 03/29/22 11:42 PM  Result Value Ref Range   Glucose-Capillary 235 (H) 70 - 99 mg/dL    Comment: Glucose reference range applies only to samples taken after fasting for at least 8 hours.  CBG monitoring, ED     Status: Abnormal   Collection Time: 03/30/22  3:50 AM  Result Value Ref Range   Glucose-Capillary 174 (H) 70 - 99 mg/dL    Comment: Glucose reference range applies only to samples taken after fasting for at least 8  hours.  Basic metabolic panel     Status: Abnormal   Collection Time: 03/30/22  6:27 AM  Result Value Ref Range   Sodium 135 135 - 145 mmol/L   Potassium 4.9 3.5 - 5.1 mmol/L   Chloride 98 98 - 111 mmol/L   CO2 23 22 - 32 mmol/L   Glucose, Bld 148 (H) 70 - 99 mg/dL    Comment: Glucose reference range applies only to samples taken after fasting for at least 8 hours.   BUN 47 (H) 6 - 20 mg/dL   Creatinine, Ser 11.03 (H) 0.44 - 1.00 mg/dL   Calcium 7.8 (L) 8.9 - 10.3 mg/dL   GFR, Estimated 4 (L) >60 mL/min    Comment: (NOTE) Calculated using the CKD-EPI Creatinine Equation (2021)    Anion gap 14 5 - 15    Comment: Performed at [facility], [address], [place], [place] [address]  CBC     Status: Abnormal  Collection Time: 03/30/22  6:27 AM  Result Value Ref Range   WBC 5.1 4.0 - 10.5 K/uL   RBC 2.98 (L) 3.87 - 5.11 MIL/uL   Hemoglobin 9.1 (L) 12.0 - 15.0 g/dL   HCT 29.5 (L) 36.0 - 46.0 %   MCV 99.0 80.0 - 100.0 fL   MCH 30.5 26.0 - 34.0 pg   MCHC 30.8 30.0 - 36.0 g/dL   RDW 16.7 (H) 11.5 - 15.5 %   Platelets 170 150 - 400 K/uL   nRBC 0.0 0.0 - 0.2 %    Comment: Performed at [facility], [address] [place], [place] [address]  CBG monitoring, ED     Status: Abnormal   Collection Time: 03/30/22  7:53 AM  Result Value Ref Range   Glucose-Capillary 130 (H) 70 - 99 mg/dL    Comment: Glucose reference range applies only to samples taken after fasting for at least 8 hours.   *Note: Due to Valerie large number of results and/or encounters for the requested time period, some results have not been displayed. Valerie complete set of results can be found in Results Review.    MR FOOT LEFT WO CONTRAST  Result Date: 03/29/2022 CLINICAL DATA:  Diabetic foot ulcer, purulent drainage. Sepsis. End-stage renal disease. Wound of the great toe. EXAM: MRI OF THE LEFT FOOT WITHOUT CONTRAST TECHNIQUE: Multiplanar, multisequence MR imaging of the left forefoot was performed. No intravenous contrast was  administered. COMPARISON:  radiographs 03/29/2022 FINDINGS: Bones/Joint/Cartilage There is abnormal marrow edema throughout the distal phalanx great toe with surrounding fluid around the periosteum of the tuft of the great toe, compatible with active osteomyelitis and periostitis. Substantial arthropathy the Lisfranc joint with erosions and subcortical marrow edema particularly involving the bases of the second, third, and fourth metatarsals as well as the distal middle and lateral cuneiforms. The fourth metatarsal bases most involved, with substantial marrow edema tracking in the metaphysis and shaft, to such Valerie degree that chronic osteomyelitis is not excluded Hallux valgus. Ligaments The Lisfranc ligament appears intact. Muscles and Tendons No intramuscular abscess or compelling evidence of myositis. Soft tissues Plantar ulceration of the great toe. There appears to be Valerie fluid collection surrounding the tuft of the great toe draining through this plantar ulceration on image 12 series 7, further cementing the great toe involvement as being due to osteomyelitis. Subcutaneous edema along dorsum of the foot, cellulitis not excluded. Substantial thickening and heterogeneous but primarily low signal in the plantar subcutaneous tissues of the foot extending along Valerie 7.1 by 1.3 by over 10.9 cm length, with loss of the normal fatty signal, possibly from chronic inflammation or fibrosis. I favor the former. IMPRESSION: 1. Osteomyelitis of the distal phalanx great toe, with surrounding fluid collection draining through the plantar ulceration. 2. Substantial arthropathy of the Lisfranc joint with erosions and subcortical marrow edema particularly involving the bases of the second, third, and fourth metatarsals as well as the distal middle and lateral cuneiforms. The fourth metatarsal base is most involved, with substantial marrow edema tracking in the metaphysis and shaft, to such Valerie degree that chronic osteomyelitis is not  excluded (although arthropathy is favored given the involvement of the other regional osseous structures). 3. Substantial thickening and heterogeneous but primarily low signal in the plantar subcutaneous tissues of the foot extending along Valerie 7.1 by 1.3 by 10.9 cm length, with loss of the normal fatty signal, possibly from chronic inflammation or fibrosis. 4. Hallux valgus. 5. Subcutaneous edema along dorsum of the  foot, cellulitis not excluded. Electronically Signed   By: Van Clines M.D.   On: 03/29/2022 18:28   DG Foot Complete Left  Result Date: 03/29/2022 CLINICAL DATA:  Foot pain, wound/ulcer. EXAM: LEFT FOOT - COMPLETE 3+ VIEW COMPARISON:  02/26/2021 FINDINGS: Minimal degenerative change of the first MTP joint and midfoot. No focal bone destruction to suggest osteomyelitis. Mild soft tissue swelling over the mid to forefoot. No air within the soft tissues. Small inferior calcaneal spur. IMPRESSION: 1. No acute findings. No evidence of osteomyelitis. 2. Mild soft tissue swelling. Electronically Signed   By: Marin Olp M.D.   On: 03/29/2022 14:58   DG Chest Port 1 View  Result Date: 03/29/2022 CLINICAL DATA:  Foot pain/ulcer. EXAM: PORTABLE CHEST 1 VIEW COMPARISON:  12/27/2021 FINDINGS: Lungs are hypoinflated and otherwise clear. Cardiomediastinal silhouette and remainder of the exam is unchanged. IMPRESSION: Hypoinflation without acute cardiopulmonary disease. Electronically Signed   By: Marin Olp M.D.   On: 03/29/2022 14:52    ROS: left foot pain  Blood pressure 113/84, pulse 90, temperature 97.6 F (36.4 C), temperature source Oral, resp. rate 17, height 5\' 6"  (1.676 m), weight 78.9 kg, SpO2 97 %. General appearance: alert and cooperative Resp: clear to auscultation bilaterally Cardio: regular rate and rhythm, S1, S2 normal, no murmur, click, rub or gallop GI: soft, non-tender; bowel sounds normal; no masses,  no organomegaly Extremities: extremities normal, atraumatic, no  cyanosis or edema and left foot with 2 old ulcers on forefoot-  big toe is open , wet and foul smelling Left upper arm AVG-  is patent   Assessment/Plan: 37 year old BF-  DM and ESRD presenting with foot wound requiring IV abx 1 foot wound-  had trauma in the recent past and had been followed by the wound center-  now with new open wound-  X-Ray negative for osteo-  admitted for IV abx-  per pt has good circulation-  she thinks will be transferring to [facility] for operative intervention  2 ESRD: normally TTS via AVG-  plan for routine HD today  3 Hypertension: BP/vol seem well controlled at this time 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 Metabolic Bone Disease: continue OP hectorol as well as renvela powder    [name] [name] 03/30/2022, 10:00 AM

## 2022-03-30 NOTE — Progress Notes (Signed)
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 Debridement Measurements: 1.6cm length x 1cm  width x 0.1cm depth; 0.126cm^3 volume. Character of Wound/Ulcer Post Debridement requires further debridement. Severity of Tissue Post Debridement is: Fat layer exposed. Post procedure Diagnosis Wound #1: Same as Pre-Procedure General Notes: Scribed for Dr. Celine Ahr by Blanche East, RN. Wound #2 Pre-procedure diagnosis of Wound #2 is an Abrasion located on the Left,Plantar T Great . There was a Excisional Skin/Subcutaneous Tissue/Muscle oe Debridement with a total area of 0.3 sq cm performed by Fredirick Maudlin, MD. With the following instrument(s): Curette Material removed includes Fat, Muscle, Callus, Subcutaneous Tissue, Slough, and Skin: Epidermis after achieving pain control using  Lidocaine 5% topical ointment. No specimens were taken. A time out was conducted at 08:27, prior to the start of the procedure. A Minimum amount of bleeding was controlled with Pressure. The procedure was tolerated well. Post Debridement Measurements: 0.5cm length x 0.6cm width x 0.1cm depth; 0.024cm^3 volume. Character of Wound/Ulcer Post Debridement requires further debridement. Post procedure Diagnosis Wound #2: Same as Pre-Procedure General Notes: Scribed for Dr. Celine Ahr by Blanche East, RN. Wound #3 Pre-procedure diagnosis of Wound #3 is a Lesion located on the Left,Medial T Great . There was a Excisional Skin/Subcutaneous Tissue/Muscle Debridement oe with a total area of 0.96 sq cm performed by Fredirick Maudlin, MD. With the following instrument(s): Curette Material removed includes Fat, Muscle, Callus, Subcutaneous Tissue, Slough, and Skin: Epidermis after achieving pain control using Lidocaine 5% topical ointment. No specimens were taken. A time out was conducted at 08:27, prior to the start of the procedure. A Minimum amount of bleeding was controlled with Pressure. The procedure was tolerated well. Post Debridement Measurements: 1.6cm length x 0.6cm width x 0.1cm depth; 0.075cm^3 volume. Character of Wound/Ulcer Post Debridement requires further debridement. Post procedure Diagnosis Wound #3: Same as Pre-Procedure General Notes: Scribed for Dr. Celine Ahr by Blanche East, RN. [name], [name] ([id number]) [id number] Page 9 of 12 Plan Follow-up Appointments: Return Appointment in 2 weeks. - Dr. Celine Ahr Room 3 Anesthetic: Wound #1 Left,Plantar Foot: (In clinic) Topical Lidocaine 5% applied to wound bed Bathing/ Shower/ Hygiene: May shower and wash wound with soap and water. Off-Loading: Other: - try to minimize walking. Continue using the Knee Scooter! Additional Orders / Instructions: Follow Nutritious Diet - Increase Protein Intake. Please try and get 60-100g  per day WOUND #1: - Foot Wound Laterality: Plantar, Left Cleanser: Soap and Water 1 x Per Day/30 Days Discharge Instructions: May shower and wash wound with dial antibacterial soap and water prior to dressing change. Cleanser: Wound Cleanser 1 x Per Day/30 Days Discharge Instructions: Cleanse the wound with wound cleanser prior to applying a clean dressing using gauze sponges, not tissue or cotton balls. Prim Dressing: Hydrofera Blue Ready Foam, 4x5 in (Generic) 1 x Per Day/30 Days ary Discharge Instructions: Apply to wound bed as instructed Secondary Dressing: Optifoam Non-Adhesive Dressing, 4x4 in (Generic) 1 x Per Day/30 Days Discharge Instructions: Apply over primary dressing cut to make foam donut Secondary Dressing: Woven Gauze Sponges 2x2 in 1 x Per Day/30 Days Discharge Instructions: Apply over primary dressing as directed. Secondary Dressing: Zetuvit Plus 4x8 in (Generic) 1 x Per Day/30 Days Discharge Instructions: Apply over primary dressing as directed. Secured With: [employer], 4.5x3.1 (in/yd) (Generic) 1 x Per Day/30 Days Discharge Instructions: Secure with Kerlix as directed. Secured With: 59M [employer] Public affairs consultant Surgical T 2x10 (in/yd) (Generic) 1 x Per Day/30 Days ape Discharge Instructions: Secure with tape as directed. WOUND #2: - T Great Wound Laterality: Plantar, Left oe Cleanser: Soap and Water  1 x Per Day/30 Days Discharge Instructions: May shower and wash wound with dial antibacterial soap and water prior to dressing change. Cleanser: Wound Cleanser 1 x Per Day/30 Days Discharge Instructions: Cleanse the wound with wound cleanser prior to applying a clean dressing using gauze sponges, not tissue or cotton balls. Prim Dressing: Hydrofera Blue Ready Foam, 4x5 in (Generic) 1 x Per Day/30 Days ary Discharge Instructions: Apply to wound bed as instructed Secondary Dressing: Optifoam Non-Adhesive Dressing, 4x4 in (Generic) 1 x Per Day/30 Days Discharge  Instructions: Apply over primary dressing cut to make foam donut Secondary Dressing: Woven Gauze Sponge, Non-Sterile 4x4 in (Generic) 1 x Per Day/30 Days Discharge Instructions: Apply over primary dressing as directed. Secondary Dressing: Woven Gauze Sponges 2x2 in (Generic) 1 x Per Day/30 Days Discharge Instructions: Apply over primary dressing as directed. Secured With: [employer], 4.5x3.1 (in/yd) (Generic) 1 x Per Day/30 Days Discharge Instructions: Secure with Kerlix as directed. Secured With: 47M [employer] Public affairs consultant Surgical T 2x10 (in/yd) (Generic) 1 x Per Day/30 Days ape Discharge Instructions: Secure with tape as directed. WOUND #3: - T Great Wound Laterality: Left, Medial oe Cleanser: Soap and Water 1 x Per Day/30 Days Discharge Instructions: May shower and wash wound with dial antibacterial soap and water prior to dressing change. Cleanser: Wound Cleanser 1 x Per Day/30 Days Discharge Instructions: Cleanse the wound with wound cleanser prior to applying a clean dressing using gauze sponges, not tissue or cotton balls. Prim Dressing: Hydrofera Blue Ready Foam, 4x5 in (Generic) 1 x Per Day/30 Days ary Discharge Instructions: Apply to wound bed as instructed Secondary Dressing: Optifoam Non-Adhesive Dressing, 4x4 in (Generic) 1 x Per Day/30 Days Discharge Instructions: Apply over primary dressing cut to make foam donut Secondary Dressing: Woven Gauze Sponge, Non-Sterile 4x4 in (Generic) 1 x Per Day/30 Days Discharge Instructions: Apply over primary dressing as directed. Secondary Dressing: Woven Gauze Sponges 2x2 in (Generic) 1 x Per Day/30 Days Discharge Instructions: Apply over primary dressing as directed. Secured With: [employer], 4.5x3.1 (in/yd) (Generic) 1 x Per Day/30 Days Discharge Instructions: Secure with Kerlix as directed. Secured With: 47M [employer] Public affairs consultant Surgical T 2x10 (in/yd) (Generic) 1 x Per Day/30 Days ape Discharge Instructions: Secure with  tape as directed. 03/29/2022: The wound on her midfoot is a little bit larger and desiccated. Unfortunately, her great toe is putrid, purulent, and partially necrotic. I used a curette to debride callus and slough from her midfoot wound. I began to debride the toe, removing slough, callus, fat, and necrotic muscle. There was substantial purulent drainage. At that point, it became clear that the toe was unlikely to be viable. Hydrofera Blue was applied to the midfoot wound. The toe was wrapped with saline moistened gauze and the patient was instructed to present herself to the emergency room. Due to her dialysis status. She needs to go to the [facility] ER, as the [facility] does not take dialysis patients. Apparently, as she was leaving, she told the case manager nurse that she was not going to go to the hospital, despite our admonition. I do hope that she changes her mind as she is at risk of becoming septic from this frankly infected and necrotic toe. Electronic Signature(s) Signed: 03/29/2022 9:43:33 AM By: Fredirick Maudlin MD FACS Entered By: Fredirick Maudlin on 03/29/2022 09:43:33 Sterling, Julianne Handler ([id number]) [id number] Page 10 of 12 -------------------------------------------------------------------------------- HxROS Details Patient Name: Date of Service: [name], DA SHA Y S. 03/29/2022 7:45 A M  Medical Record Number: [id number] Patient Account Number: [id number] Date of Birth/Sex: Treating RN: Apr 22, 1985 (37 y.o. F) Primary Care Provider: [name] Other Clinician: Referring Provider: Treating Provider/Extender: Consuelo Pandy in Treatment: 10 Information Obtained From Patient Chart Eyes Medical History: Negative for: Cataracts; Glaucoma; Optic Neuritis Ear/Nose/Mouth/Throat Medical History: Negative for: Chronic sinus problems/congestion; Middle ear problems Hematologic/Lymphatic Medical History: Positive for:  Anemia Cardiovascular Medical History: Positive for: Hypertension Gastrointestinal Medical History: Past Medical History Notes: hiatal hernis, GERD, esophageal stricture, hx GI bleed, liver lesion Endocrine Medical History: Positive for: Type I Diabetes Negative for: Type II Diabetes Time with diabetes: since 2022 Treated with: Insulin Blood sugar tested every day: Yes Tested : has dexcom Genitourinary Medical History: Positive for: End Stage Renal Disease - HD Past Medical History Notes: hx failed kidney transplant Integumentary (Skin) Medical History: Negative for: History of Burn Neurologic Medical History: Positive for: Neuropathy Past Medical History Notes: pseudoseizures Oncologic Medical History: Negative for: Received Chemotherapy; Received Radiation [name], [name] ([id number]) [phone].pdf Page 11 of 12 Psychiatric Medical History: Positive for: Confinement Anxiety Negative for: Anorexia/bulimia Immunizations Pneumococcal Vaccine: Received Pneumococcal Vaccination: No Implantable Devices Yes Hospitalization / Surgery History Type of Hospitalization/Surgery left arm AV fistula mult EGDs with balloon dilitation cholecystectomy kidney transplant parathyroidectomy Family and Social History Cancer: No; Diabetes: Yes - Father; Heart Disease: Yes - Father; Hereditary Spherocytosis: No; Hypertension: Yes - Mother; Kidney Disease: No; Lung Disease: No; Seizures: No; Stroke: Yes - Mother; Thyroid Problems: No; Tuberculosis: No; Never smoker; Marital Status - Single; Alcohol Use: Never; Drug Use: No History; Caffeine Use: Never; Financial Concerns: No; Food, Clothing or Shelter Needs: No; Support System Lacking: No; Transportation Concerns: No Electronic Signature(s) Signed: 03/29/2022 12:17:58 PM By: Fredirick Maudlin MD FACS Entered By: Fredirick Maudlin on 03/29/2022  09:31:35 -------------------------------------------------------------------------------- SuperBill Details Patient Name: Date of Service: [name], DA SHA Y S. 03/29/2022 Medical Record Number: [id number] Patient Account Number: [id number] Date of Birth/Sex: Treating RN: 02-25-1986 (37 y.o. 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, XXX E10.65 Type 1 diabetes mellitus with hyperglycemia E10.621 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 Facility Procedures : CPT4 Code: [id number] Description: 65035 - DEB MUSC/FASCIA 20 SQ CM/< ICD-10 Diagnosis Description L97.528 Non-pressure chronic ulcer of other part of left foot with other specified severi Modifier: ty Quantity: 1 : Benedick, DA CPT4 Code: [id number] SHAY S ([id number]) Description: 44967 - DEBRIDE WOUND 1ST 20 SQ CM OR < ICD-10 Diagnosis Description L97.422 Non-pressure chronic ulcer of left heel and midfoot with fat layer exposed [id number] Modifier: [id number] Quantity: 1 27.pdf Page 12 of 12 Physician Procedures : CPT4 Code Description Modifier [id number] 09233 - WC PHYS LEVEL 3 - EST PT 25 ICD-10 Diagnosis Description L97.422 Non-pressure chronic ulcer of left heel and midfoot with fat layer exposed L97.528 Non-pressure chronic ulcer of other part of left foot  with other specified severity E10.621 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 N18.6 End stage renal disease Quantity: 1 : [id number] 11043 - WC PHYS DEBR MUSCLE/FASCIA 20 SQ CM ICD-10 Diagnosis Description L97.528 Non-pressure chronic  ulcer of other part of left foot with other specified severity Quantity: 1 :  [id number] 97597 - WC PHYS DEBR WO ANESTH 20 SQ CM ICD-10 Diagnosis Description L97.422 Non-pressure chronic ulcer of left heel and midfoot with fat layer exposed Quantity: 1 Electronic Signature(s) Signed: 03/29/2022 9:45:23 AM By: Fredirick Maudlin MD FACS Entered By: Fredirick Maudlin on 03/29/2022 09:45:22

## 2022-03-30 NOTE — ED Notes (Signed)
Pt ambulated to [facility].

## 2022-03-30 NOTE — ED Notes (Signed)
This tech went into patients room to get patients blood sugar. Patient asking to be taken off NPO at this time because she was eating and drinking last night. Patient wants graham crackers, ginger ale, and ice chips. Nurse notified.

## 2022-03-30 NOTE — Progress Notes (Signed)
PROGRESS NOTE    LORALYE LOBERG  PFX:[id number] DOB: 04-Apr-1985 DOA: 03/29/2022 PCP: [name], MD   Brief Narrative:  HPI: JERNEY BAKSH is a 37 y.o. female with medical history significant for type 1 diabetes, ESRD Tuesday Thursday Saturday,  seizures/pseudoseizures. Patient presented to the ED with complaints of pain and swelling to her left lower foot.  Symptoms started with some pain to her left leg on Tuesday a week ago, 1/9.  2 days later she reports a blister on her left big toe suddenly opened up with drainage of foul-smelling discharge.  She follows with wound care clinic.   She reports just after Thanksgiving in November, she had an accident at [facility] tomato jar fell, and broke, and the glass pierced through her foot with puncturing her left big toe.  She denies any symptoms after this.  She denies any subsequent trauma to the big toe.  She reports hot gravy poured on her left foot causing wounds to the sole last year, she reports the wounds from this have healed.  She has neuropathy to bilateral feet.   Patient was at [facility] ER, but came to [facility] due to long wait time.   ED Course: Tmax 101.  Tachycardic to 103.  Respirate rate 16.  Blood pressure 111/86.  WBC 9.6.  Lactic acid 1.  X-ray of the left foot negative for acute findings, no evidence of osteomyelitis. IV vancomycin and Zosyn started.  Hospitalist to admit for cellulitis.  Assessment & Plan:   Principal Problem:   Diabetic foot ulcer ([place]) Active Problems:   Sepsis ([place])   ESRD on hemodialysis ([place])   Anemia due to chronic kidney disease   XXX syndrome   Diabetes mellitus type 1 ([place])   Seizures ([place])   Osteomyelitis of great toe of left foot ([place])  Diabetic foot ulcer/osteomyelitis of the left great toe/cellulitis: MRI confirms osteomyelitis.  Patient has no tenderness on my exam.  Patient will need definitive treatment, likely amputation.  Reportedly Dr. Sharol Given is on vacation.  I have discussed the  case with Dr. Maren Beach who will see patient upon arrival to the [facility], in the meantime, we will continue current broad-spectrum antibiotics which is IV vancomycin and Zosyn.  ESRD on HD: TTS schedule.  I have notified/consulted nephrology.  History of seizure disorder: Stable.  She has history of pseudoseizures.  She is not on any medications.  Type 2 diabetes mellitus: Last hemoglobin A1c in October 2023 was 9.2, current hemoglobin A1c is pending.  Currently on Semglee 10 units and SSI, blood sugar fairly controlled.  Hyperlipidemia: Resume statin.  Essential hypertension: Only on metoprolol PTA, blood pressure controlled, resume metoprolol.  Anemia of chronic disease: Hemoglobin stable.  DVT prophylaxis: Will start on Lovenox.   Code Status: Full Code  Family Communication:  None present at bedside.  Plan of care discussed with patient in length and he/she verbalized understanding and agreed with it.  Status is: Inpatient Remains inpatient appropriate because: Patient with osteomyelitis, might need amputation.  Waiting for transfer to [facility].   Estimated body mass index is 28.08 kg/m as calculated from the following:   Height as of this encounter: 5\' 6"  (1.676 m).   Weight as of this encounter: 78.9 kg.    Nutritional Assessment: Body mass index is 28.08 kg/m.Marland Kitchen Seen by dietician.  I agree with the assessment and plan as outlined below: Nutrition Status:        . Skin Assessment: I have  examined the patient's skin and I agree with the wound assessment as performed by the wound care RN as outlined below:    Consultants:  Podiatry  Procedures:  None  Antimicrobials:  Anti-infectives (From admission, onward)    Start     Dose/Rate Route Frequency Ordered Stop   03/30/22 1600  vancomycin (VANCOREADY) IVPB 750 mg/150 mL        750 mg 150 mL/hr over 60 Minutes Intravenous Every T-Th-Sa (Hemodialysis) 03/29/22 1631     03/30/22 0200   piperacillin-tazobactam (ZOSYN) IVPB 2.25 g  Status:  Discontinued        2.25 g 100 mL/hr over 30 Minutes Intravenous Every 8 hours 03/29/22 1710 03/29/22 1718   03/30/22 0200  piperacillin-tazobactam (ZOSYN) IVPB 2.25 g  Status:  Discontinued        2.25 g 100 mL/hr over 30 Minutes Intravenous Every 8 hours 03/29/22 1718 03/29/22 1719   03/30/22 0200  piperacillin-tazobactam (ZOSYN) 2.25 g in sodium chloride 0.9 % 50 mL IVPB        2.25 g 100 mL/hr over 30 Minutes Intravenous Every 8 hours 03/29/22 1719     03/29/22 1715  piperacillin-tazobactam (ZOSYN) IVPB 3.375 g        3.375 g 100 mL/hr over 30 Minutes Intravenous  Once 03/29/22 1708 03/29/22 1903   03/29/22 1630  piperacillin-tazobactam (ZOSYN) IVPB 3.375 g  Status:  Discontinued        3.375 g 12.5 mL/hr over 240 Minutes Intravenous  Once 03/29/22 1611 03/29/22 1708   03/29/22 1630  vancomycin (VANCOREADY) IVPB 1500 mg/300 mL        1,500 mg 150 mL/hr over 120 Minutes Intravenous  Once 03/29/22 1617 03/29/22 2013         Subjective: Patient seen and examined in the ED, she has no complaints.  She feels better.  She has a lot of questions about the treatment options for her osteomyelitis.  I advised her that she will be seen by podiatry at [facility].  Objective: Vitals:   03/30/22 0002 03/30/22 0404 03/30/22 0815 03/30/22 0818  BP: 118/87 114/74 113/84   Pulse: 87 81 90   Resp: 17 15 17    Temp: 99 F (37.2 C)   97.6 F (36.4 C)  TempSrc: Oral   Oral  SpO2: 96% 95% 97%   Weight:      Height:        Intake/Output Summary (Last 24 hours) at 03/30/2022 0914 Last data filed at 03/30/2022 0254 Gross per 24 hour  Intake 939.48 ml  Output --  Net 939.48 ml   Filed Weights   03/29/22 1220  Weight: 78.9 kg    Examination:  General exam: Appears calm and comfortable  Respiratory system: Clear to auscultation. Respiratory effort normal. Cardiovascular system: S1 & S2 heard, RRR. No JVD, murmurs, rubs, gallops or  clicks. No pedal edema. Gastrointestinal system: Abdomen is nondistended, soft and nontender. No organomegaly or masses felt. Normal bowel sounds heard. Central nervous system: Alert and oriented. No focal neurological deficits. Extremities: Mild erythema with ulcer at the left foot.  Pictures below. Psychiatry: Judgement and insight appear normal. Mood & affect appropriate.       Data Reviewed: I have personally reviewed following labs and imaging studies  CBC: Recent Labs  Lab 03/23/22 1500 03/29/22 1424 03/30/22 0627  WBC 5.6 9.6 5.1  NEUTROABS  --  7.9*  --   HGB 11.1* 10.8* 9.1*  HCT 33.8* 35.0* 29.5*  MCV 94.9  98.3 99.0  PLT 141 221 322   Basic Metabolic Panel: Recent Labs  Lab 03/23/22 1500 03/29/22 1424 03/30/22 0627  NA 134* 131* 135  K 4.4 5.5* 4.9  CL 95* 92* 98  CO2 21 25 23   GLUCOSE 448* 242* 148*  BUN 21 39* 47*  CREATININE 6.17* 9.44* 11.03*  CALCIUM 9.4 8.4* 7.8*   GFR: Estimated Creatinine Clearance: 7.3 mL/min (A) (by C-G formula based on SCr of 11.03 mg/dL (H)). Liver Function Tests: Recent Labs  Lab 03/23/22 1500 03/29/22 1424  AST 12 15  ALT 8 11  ALKPHOS  --  59  BILITOT 0.4 0.6  PROT 8.6* 9.4*  ALBUMIN  --  3.9   No results for input(s): "LIPASE", "AMYLASE" in the last 168 hours. No results for input(s): "AMMONIA" in the last 168 hours. Coagulation Profile: Recent Labs  Lab 03/29/22 1424  INR 1.1   Cardiac Enzymes: No results for input(s): "CKTOTAL", "CKMB", "CKMBINDEX", "TROPONINI" in the last 168 hours. BNP (last 3 results) No results for input(s): "PROBNP" in the last 8760 hours. HbA1C: No results for input(s): "HGBA1C" in the last 72 hours. CBG: Recent Labs  Lab 03/29/22 2004 03/29/22 2342 03/30/22 0350 03/30/22 0753  GLUCAP 376* 235* 174* 130*   Lipid Profile: No results for input(s): "CHOL", "HDL", "LDLCALC", "TRIG", "CHOLHDL", "LDLDIRECT" in the last 72 hours. Thyroid Function Tests: No results for input(s):  "TSH", "T4TOTAL", "FREET4", "T3FREE", "THYROIDAB" in the last 72 hours. Anemia Panel: No results for input(s): "VITAMINB12", "FOLATE", "FERRITIN", "TIBC", "IRON", "RETICCTPCT" in the last 72 hours. Sepsis Labs: Recent Labs  Lab 03/29/22 1424  LATICACIDVEN 1.0    Recent Results (from the past 240 hour(s))  Culture, blood (Routine x 2)     Status: None (Preliminary result)   Collection Time: 03/29/22  2:24 PM   Specimen: BLOOD RIGHT HAND  Result Value Ref Range Status   Specimen Description BLOOD RIGHT HAND  Final   Special Requests   Final    BOTTLES DRAWN AEROBIC AND ANAEROBIC Blood Culture adequate volume   Culture   Final    NO GROWTH < 24 HOURS Performed at [facility], [address], [place], [place] [address]    Report Status PENDING  Incomplete  Culture, blood (Routine x 2)     Status: None (Preliminary result)   Collection Time: 03/29/22  2:24 PM   Specimen: BLOOD RIGHT ARM  Result Value Ref Range Status   Specimen Description BLOOD RIGHT ARM  Final   Special Requests   Final    BOTTLES DRAWN AEROBIC AND ANAEROBIC Blood Culture adequate volume   Culture   Final    NO GROWTH < 24 HOURS Performed at [facility], [address] [place], [place] [address]    Report Status PENDING  Incomplete  Resp panel by RT-PCR (RSV, Flu A&B, Covid) Anterior Nasal Swab     Status: None   Collection Time: 03/29/22  8:01 PM   Specimen: Anterior Nasal Swab  Result Value Ref Range Status   SARS Coronavirus 2 by RT PCR NEGATIVE NEGATIVE Final    Comment: (NOTE) SARS-CoV-2 target nucleic acids are NOT DETECTED.  The SARS-CoV-2 RNA is generally detectable in upper respiratory specimens during the acute phase of infection. The lowest concentration of SARS-CoV-2 viral copies this assay can detect is 138 copies/mL. A negative result does not preclude SARS-Cov-2 infection and should not be used as the sole basis for treatment or other patient management decisions. A negative result may  occur with  improper specimen collection/handling, submission of specimen other than nasopharyngeal swab, presence of viral mutation(s) within the areas targeted by this assay, and inadequate number of viral copies(<138 copies/mL). A negative result must be combined with clinical observations, patient history, and epidemiological information. The expected result is Negative.  Fact Sheet for Patients:  EntrepreneurPulse.com.au  Fact Sheet for Healthcare Providers:  IncredibleEmployment.be  This test is no t yet approved or cleared by the [place] FDA and  has been authorized for detection and/or diagnosis of SARS-CoV-2 by FDA under an Emergency Use Authorization (EUA). This EUA will remain  in effect (meaning this test can be used) for the duration of the COVID-19 declaration under Section 564(b)(1) of the Act, 21 U.S.C.section 360bbb-3(b)(1), unless the authorization is terminated  or revoked sooner.       Influenza A by PCR NEGATIVE NEGATIVE Final   Influenza B by PCR NEGATIVE NEGATIVE Final    Comment: (NOTE) The Xpert Xpress SARS-CoV-2/FLU/RSV plus assay is intended as an aid in the diagnosis of influenza from Nasopharyngeal swab specimens and should not be used as a sole basis for treatment. Nasal washings and aspirates are unacceptable for Xpert Xpress SARS-CoV-2/FLU/RSV testing.  Fact Sheet for Patients: EntrepreneurPulse.com.au  Fact Sheet for Healthcare Providers: IncredibleEmployment.be  This test is not yet approved or cleared by the [place] FDA and has been authorized for detection and/or diagnosis of SARS-CoV-2 by FDA under an Emergency Use Authorization (EUA). This EUA will remain in effect (meaning this test can be used) for the duration of the COVID-19 declaration under Section 564(b)(1) of the Act, 21 U.S.C. section 360bbb-3(b)(1), unless the authorization is terminated  or revoked.     Resp Syncytial Virus by PCR NEGATIVE NEGATIVE Final    Comment: (NOTE) Fact Sheet for Patients: EntrepreneurPulse.com.au  Fact Sheet for Healthcare Providers: IncredibleEmployment.be  This test is not yet approved or cleared by the [place] FDA and has been authorized for detection and/or diagnosis of SARS-CoV-2 by FDA under an Emergency Use Authorization (EUA). This EUA will remain in effect (meaning this test can be used) for the duration of the COVID-19 declaration under Section 564(b)(1) of the Act, 21 U.S.C. section 360bbb-3(b)(1), unless the authorization is terminated or revoked.  Performed at [facility], [address] [place], [place] [address]      Radiology Studies: MR FOOT LEFT WO CONTRAST  Result Date: 03/29/2022 CLINICAL DATA:  Diabetic foot ulcer, purulent drainage. Sepsis. End-stage renal disease. Wound of the great toe. EXAM: MRI OF THE LEFT FOOT WITHOUT CONTRAST TECHNIQUE: Multiplanar, multisequence MR imaging of the left forefoot was performed. No intravenous contrast was administered. COMPARISON:  radiographs 03/29/2022 FINDINGS: Bones/Joint/Cartilage There is abnormal marrow edema throughout the distal phalanx great toe with surrounding fluid around the periosteum of the tuft of the great toe, compatible with active osteomyelitis and periostitis. Substantial arthropathy the Lisfranc joint with erosions and subcortical marrow edema particularly involving the bases of the second, third, and fourth metatarsals as well as the distal middle and lateral cuneiforms. The fourth metatarsal bases most involved, with substantial marrow edema tracking in the metaphysis and shaft, to such a degree that chronic osteomyelitis is not excluded Hallux valgus. Ligaments The Lisfranc ligament appears intact. Muscles and Tendons No intramuscular abscess or compelling evidence of myositis. Soft tissues Plantar ulceration of the great  toe. There appears to be a fluid collection surrounding the tuft of the great toe draining through this plantar ulceration on image 12 series 7, further  cementing the great toe involvement as being due to osteomyelitis. Subcutaneous edema along dorsum of the foot, cellulitis not excluded. Substantial thickening and heterogeneous but primarily low signal in the plantar subcutaneous tissues of the foot extending along a 7.1 by 1.3 by over 10.9 cm length, with loss of the normal fatty signal, possibly from chronic inflammation or fibrosis. I favor the former. IMPRESSION: 1. Osteomyelitis of the distal phalanx great toe, with surrounding fluid collection draining through the plantar ulceration. 2. Substantial arthropathy of the Lisfranc joint with erosions and subcortical marrow edema particularly involving the bases of the second, third, and fourth metatarsals as well as the distal middle and lateral cuneiforms. The fourth metatarsal base is most involved, with substantial marrow edema tracking in the metaphysis and shaft, to such a degree that chronic osteomyelitis is not excluded (although arthropathy is favored given the involvement of the other regional osseous structures). 3. Substantial thickening and heterogeneous but primarily low signal in the plantar subcutaneous tissues of the foot extending along a 7.1 by 1.3 by 10.9 cm length, with loss of the normal fatty signal, possibly from chronic inflammation or fibrosis. 4. Hallux valgus. 5. Subcutaneous edema along dorsum of the foot, cellulitis not excluded. Electronically Signed   By: Van Clines M.D.   On: 03/29/2022 18:28   DG Foot Complete Left  Result Date: 03/29/2022 CLINICAL DATA:  Foot pain, wound/ulcer. EXAM: LEFT FOOT - COMPLETE 3+ VIEW COMPARISON:  02/26/2021 FINDINGS: Minimal degenerative change of the first MTP joint and midfoot. No focal bone destruction to suggest osteomyelitis. Mild soft tissue swelling over the mid to forefoot. No air  within the soft tissues. Small inferior calcaneal spur. IMPRESSION: 1. No acute findings. No evidence of osteomyelitis. 2. Mild soft tissue swelling. Electronically Signed   By: Marin Olp M.D.   On: 03/29/2022 14:58   DG Chest Port 1 View  Result Date: 03/29/2022 CLINICAL DATA:  Foot pain/ulcer. EXAM: PORTABLE CHEST 1 VIEW COMPARISON:  12/27/2021 FINDINGS: Lungs are hypoinflated and otherwise clear. Cardiomediastinal silhouette and remainder of the exam is unchanged. IMPRESSION: Hypoinflation without acute cardiopulmonary disease. Electronically Signed   By: Marin Olp M.D.   On: 03/29/2022 14:52    Scheduled Meds:  calcium acetate  1,334 mg Oral TID WC   calcium carbonate (dosed in mg elemental calcium)  500 mg of elemental calcium Oral Daily   insulin aspart  0-9 Units Subcutaneous Q4H   insulin glargine-yfgn  8 Units Subcutaneous Daily   sevelamer carbonate  2.4 g Oral TID WC   Continuous Infusions:  piperacillin-tazobactam (ZOSYN)  IV Stopped (03/30/22 0254)   vancomycin       LOS: 1 day   Darliss Cheney, MD [facility]  03/30/2022, 9:14 AM   *Please note that this is a verbal dictation therefore any spelling or grammatical errors are due to the "[place]" system interpretation.  Please page via [place] and do not message via secure chat for urgent patient care matters. Secure chat can be used for non urgent patient care matters.  How to contact the [facility] Attending or Consulting provider [place] or covering provider during after hours [place], for this patient?  Check the care team in [facility] and look for a) attending/consulting TRH provider listed and b) the [facility] team listed. Page or secure chat [phone]. Log into [email or web address] and use [place]'s universal password to access. If you do not have the password, please contact the hospital operator. Locate the [facility] provider you  are looking for under [facility] and page to a number that you can be directly  reached. If you still have difficulty reaching the provider, please page the [facility] (Director on Call) for the Hospitalists listed on amion for assistance.

## 2022-03-31 DIAGNOSIS — L97529 Non-pressure chronic ulcer of other part of left foot with unspecified severity: Secondary | ICD-10-CM | POA: Diagnosis not present

## 2022-03-31 DIAGNOSIS — E10621 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: Secondary | ICD-10-CM | POA: Diagnosis not present

## 2022-03-31 DIAGNOSIS — L97509 Non-pressure chronic ulcer of other part of unspecified foot with unspecified severity: Secondary | ICD-10-CM

## 2022-03-31 LAB — CBC WITH DIFFERENTIAL/PLATELET
Abs Immature Granulocytes: 0.02 K/uL (ref 0.00–0.07)
Basophils Absolute: 0.1 K/uL (ref 0.0–0.1)
Basophils Relative: 1 %
Eosinophils Absolute: 0.3 K/uL (ref 0.0–0.5)
Eosinophils Relative: 7 %
HCT: 33.5 % — ABNORMAL LOW (ref 36.0–46.0)
Hemoglobin: 10.1 g/dL — ABNORMAL LOW (ref 12.0–15.0)
Immature Granulocytes: 0 %
Lymphocytes Relative: 22 %
Lymphs Abs: 1.1 K/uL (ref 0.7–4.0)
MCH: 30.1 pg (ref 26.0–34.0)
MCHC: 30.1 g/dL (ref 30.0–36.0)
MCV: 99.7 fL (ref 80.0–100.0)
Monocytes Absolute: 0.4 K/uL (ref 0.1–1.0)
Monocytes Relative: 9 %
Neutro Abs: 3.1 K/uL (ref 1.7–7.7)
Neutrophils Relative %: 61 %
Platelets: 175 K/uL (ref 150–400)
RBC: 3.36 MIL/uL — ABNORMAL LOW (ref 3.87–5.11)
RDW: 16.4 % — ABNORMAL HIGH (ref 11.5–15.5)
WBC: 5.1 K/uL (ref 4.0–10.5)
nRBC: 0 % (ref 0.0–0.2)

## 2022-03-31 LAB — BASIC METABOLIC PANEL
Anion gap: 14 (ref 5–15)
BUN: 33 mg/dL — ABNORMAL HIGH (ref 6–20)
CO2: 23 mmol/L (ref 22–32)
Calcium: 8.1 mg/dL — ABNORMAL LOW (ref 8.9–10.3)
Chloride: 98 mmol/L (ref 98–111)
Creatinine, Ser: 8.76 mg/dL — ABNORMAL HIGH (ref 0.44–1.00)
GFR, Estimated: 5 mL/min — ABNORMAL LOW (ref >60)
Glucose, Bld: 140 mg/dL — ABNORMAL HIGH (ref 70–99)
Potassium: 5 mmol/L (ref 3.5–5.1)
Sodium: 135 mmol/L (ref 135–145)

## 2022-03-31 LAB — GLUCOSE, CAPILLARY
Glucose-Capillary: 203 mg/dL — ABNORMAL HIGH (ref 70–99)
Glucose-Capillary: 228 mg/dL — ABNORMAL HIGH (ref 70–99)
Glucose-Capillary: 153 mg/dL — ABNORMAL HIGH (ref 70–99)
Glucose-Capillary: 181 mg/dL — ABNORMAL HIGH (ref 70–99)
Glucose-Capillary: 199 mg/dL — ABNORMAL HIGH (ref 70–99)

## 2022-03-31 LAB — C-REACTIVE PROTEIN: CRP: 14.3 mg/dL — ABNORMAL HIGH (ref <1.0)

## 2022-03-31 LAB — HEMOGLOBIN A1C
Hgb A1c MFr Bld: 9.1 % — ABNORMAL HIGH (ref 4.8–5.6)
Mean Plasma Glucose: 214 mg/dL

## 2022-03-31 LAB — HEPATITIS B SURFACE ANTIBODY, QUANTITATIVE: Hep B S AB Quant (Post): 75.8 m[IU]/mL (ref >9.9)

## 2022-03-31 LAB — TYPE AND SCREEN
ABO/RH(D): A POS
Antibody Screen: NEGATIVE

## 2022-03-31 LAB — SEDIMENTATION RATE: Sed Rate: 88 mm/hr — ABNORMAL HIGH (ref 0–22)

## 2022-03-31 LAB — PROTIME-INR
INR: 1.1 (ref 0.8–1.2)
Prothrombin Time: 14.4 seconds (ref 11.4–15.2)

## 2022-03-31 MED ORDER — PIPERACILLIN-TAZOBACTAM IN DEX 2-0.25 GM/50ML IV SOLN
2.2500 g | Freq: Three times a day (TID) | INTRAVENOUS | Status: DC
  Administered 2022-03-31 – 2022-04-02 (×8): 2.25 g via INTRAVENOUS
  Administered 2022-04-02: 3.375 g via INTRAVENOUS
  Administered 2022-04-03 – 2022-04-04 (×3): 2.25 g via INTRAVENOUS
  Filled 2022-03-31 (×14): qty 50

## 2022-03-31 MED ORDER — FENTANYL CITRATE PF 50 MCG/ML IJ SOSY
25.0000 ug | PREFILLED_SYRINGE | INTRAMUSCULAR | Status: AC | PRN
  Administered 2022-03-31: 25 ug via INTRAVENOUS
  Filled 2022-03-31: qty 1

## 2022-03-31 MED ORDER — HYDROMORPHONE HCL 1 MG/ML IJ SOLN
1.0000 mg | INTRAMUSCULAR | Status: DC | PRN
  Filled 2022-03-31: qty 1

## 2022-03-31 NOTE — Progress Notes (Signed)
[facility] Progress Note   Subjective: Seen in room. Good spirits, but foot hurts. Waiting to hear plan for foot. Dialysis yesterday -no issues   Objective Vitals:   03/30/22 1918 03/30/22 2213 03/31/22 0419 03/31/22 0746  BP:  (!) 139/98 (!) 148/95 103/78  Pulse: 93 (!) 106 100 (!) 103  Resp:  17 17 16   Temp:  99.5 F (37.5 C) 98.6 F (37 C) 99.8 F (37.7 C)  TempSrc:  Oral Oral Oral  SpO2: 100% 93% 100% 97%  Weight:  85 kg    Height:         Additional Objective Labs: Basic Metabolic Panel: Recent Labs  Lab 03/29/22 1424 03/30/22 0627 03/31/22 0325  NA 131* 135 135  K 5.5* 4.9 5.0  CL 92* 98 98  CO2 25 23 23   GLUCOSE 242* 148* 140*  BUN 39* 47* 33*  CREATININE 9.44* 11.03* 8.76*  CALCIUM 8.4* 7.8* 8.1*   CBC: Recent Labs  Lab 03/29/22 1424 03/30/22 0627 03/31/22 0325  WBC 9.6 5.1 5.1  NEUTROABS 7.9*  --  3.1  HGB 10.8* 9.1* 10.1*  HCT 35.0* 29.5* 33.5*  MCV 98.3 99.0 99.7  PLT 221 170 175   Blood Culture    Component Value Date/Time   SDES BLOOD RIGHT HAND 03/29/2022 1424   SDES BLOOD RIGHT ARM 03/29/2022 1424   SPECREQUEST  03/29/2022 1424    BOTTLES DRAWN AEROBIC AND ANAEROBIC Blood Culture adequate volume   SPECREQUEST  03/29/2022 1424    BOTTLES DRAWN AEROBIC AND ANAEROBIC Blood Culture adequate volume   CULT  03/29/2022 1424    NO GROWTH 2 DAYS Performed at [facility], [address], [place], [place] [address]    CULT  03/29/2022 1424    NO GROWTH 2 DAYS Performed at [facility], [address], [place], [place] [address]    REPTSTATUS PENDING 03/29/2022 1424   REPTSTATUS PENDING 03/29/2022 1424     Physical Exam General: Well appearing, nad  Heart: RRR  Lungs: Clear bilaterally  Abdomen: soft non -tender  Extremities: No sig LE edema; L foot ulcers  Dialysis Access: LUE AVG +bruit   Medications:  piperacillin-tazobactam (ZOSYN)  IV 2.25 g (03/31/22 0812)   vancomycin 750 mg (03/30/22 1600)    calcium  acetate  1,334 mg Oral TID WC   calcium carbonate (dosed in mg elemental calcium)  500 mg of elemental calcium Oral Daily   doxercalciferol  2 mcg Intravenous Q T,Th,Sa-HD   famotidine  40 mg Oral Daily   gabapentin  600 mg Oral TID   heparin  5,000 Units Subcutaneous Q8H   insulin aspart  0-9 Units Subcutaneous Q4H   insulin glargine-yfgn  8 Units Subcutaneous Daily   pantoprazole  40 mg Oral Daily   sevelamer carbonate  2.4 g Oral TID WC   simvastatin  20 mg Oral QHS    Dialysis Orders:  AF-  TTS 4 hours  EDW 79. HD Bath 2K/3calc, Dialyzer 180, Heparin yes-  4000 then 2500 mid. Access AVG-  16 gauge - 300 BFR.  Last HD Sat-  left out 80.6-  likely needs EDW up-  last hgb 10.8, phos 6.2, pth 89  hect 2 q tx, mircera 3  Assessment/Plan: 1. Left foot ulcer - Had trauma to foot. MRI with suspicion for osteo; IV vanc/Zosyn. Per primary/podiatry  2. ESRD - HD TTS - HD 1/18 uses 16g needles  3. HTN/volume-  BP/volume stable  4. Anemia-  Hb 10.1  Follow trends. No ESA currently  5. MBD of CKD -  Continue hectorol/Renvela binder 6. Nutrition - Renal diet with fluid restriction   Lynnda Child PA-C [facility] 03/31/2022,12:22 PM

## 2022-03-31 NOTE — Plan of Care (Signed)
Problem: Education: Goal: Ability to describe self-care measures that may prevent or decrease complications (Diabetes Survival Skills Education) will improve 03/31/2022 2029 by Particia Lather, RN Outcome: Progressing 03/31/2022 0703 by Particia Lather, RN Outcome: Progressing Goal: Individualized Educational Video(s) 03/31/2022 2029 by Particia Lather, RN Outcome: Progressing 03/31/2022 0703 by Particia Lather, RN Outcome: Progressing   Problem: Coping: Goal: Ability to adjust to condition or change in health will improve 03/31/2022 2029 by Particia Lather, RN Outcome: Progressing 03/31/2022 0703 by Particia Lather, RN Outcome: Progressing   Problem: Fluid Volume: Goal: Ability to maintain a balanced intake and output will improve 03/31/2022 2029 by Particia Lather, RN Outcome: Progressing 03/31/2022 0703 by Particia Lather, RN Outcome: Progressing   Problem: Health Behavior/Discharge Planning: Goal: Ability to identify and utilize available resources and services will improve 03/31/2022 2029 by Particia Lather, RN Outcome: Progressing 03/31/2022 0703 by Particia Lather, RN Outcome: Progressing Goal: Ability to manage health-related needs will improve 03/31/2022 2029 by Particia Lather, RN Outcome: Progressing 03/31/2022 0703 by Particia Lather, RN Outcome: Progressing   Problem: Metabolic: Goal: Ability to maintain appropriate glucose levels will improve 03/31/2022 2029 by Particia Lather, RN Outcome: Progressing 03/31/2022 0703 by Particia Lather, RN Outcome: Progressing   Problem: Nutritional: Goal: Maintenance of adequate nutrition will improve 03/31/2022 2029 by Particia Lather, RN Outcome: Progressing 03/31/2022 0703 by Particia Lather, RN Outcome: Progressing Goal: Progress toward achieving an optimal weight will improve 03/31/2022 2029 by Particia Lather, RN Outcome: Progressing 03/31/2022 0703 by Particia Lather, RN Outcome:  Progressing   Problem: Skin Integrity: Goal: Risk for impaired skin integrity will decrease 03/31/2022 2029 by Particia Lather, RN Outcome: Progressing 03/31/2022 0703 by Particia Lather, RN Outcome: Progressing   Problem: Tissue Perfusion: Goal: Adequacy of tissue perfusion will improve 03/31/2022 2029 by Particia Lather, RN Outcome: Progressing 03/31/2022 0703 by Particia Lather, RN Outcome: Progressing   Problem: Education: Goal: Knowledge of General Education information will improve Description: Including pain rating scale, medication(s)/side effects and non-pharmacologic comfort measures 03/31/2022 2029 by Particia Lather, RN Outcome: Progressing 03/31/2022 0703 by Particia Lather, RN Outcome: Progressing   Problem: Health Behavior/Discharge Planning: Goal: Ability to manage health-related needs will improve 03/31/2022 2029 by Particia Lather, RN Outcome: Progressing 03/31/2022 0703 by Particia Lather, RN Outcome: Progressing   Problem: Clinical Measurements: Goal: Ability to maintain clinical measurements within normal limits will improve 03/31/2022 2029 by Particia Lather, RN Outcome: Progressing 03/31/2022 0703 by Particia Lather, RN Outcome: Progressing Goal: Will remain free from infection 03/31/2022 2029 by Particia Lather, RN Outcome: Progressing 03/31/2022 0703 by Particia Lather, RN Outcome: Progressing Goal: Diagnostic test results will improve 03/31/2022 2029 by Particia Lather, RN Outcome: Progressing 03/31/2022 0703 by Particia Lather, RN Outcome: Progressing Goal: Respiratory complications will improve 03/31/2022 2029 by Particia Lather, RN Outcome: Progressing 03/31/2022 0703 by Particia Lather, RN Outcome: Progressing Goal: Cardiovascular complication will be avoided 03/31/2022 2029 by Particia Lather, RN Outcome: Progressing 03/31/2022 0703 by Particia Lather, RN Outcome: Progressing   Problem: Activity: Goal: Risk  for activity intolerance will decrease 03/31/2022 2029 by Particia Lather, RN Outcome: Progressing 03/31/2022 0703 by Particia Lather, RN Outcome: Progressing   Problem: Nutrition: Goal: Adequate nutrition will be maintained 03/31/2022 2029 by Particia Lather, RN Outcome: Progressing 03/31/2022 0703 by Particia Lather, RN Outcome: Progressing  Problem: Coping: Goal: Level of anxiety will decrease 03/31/2022 2029 by Particia Lather, RN Outcome: Progressing 03/31/2022 0703 by Particia Lather, RN Outcome: Progressing   Problem: Elimination: Goal: Will not experience complications related to bowel motility 03/31/2022 2029 by Particia Lather, RN Outcome: Progressing 03/31/2022 0703 by Particia Lather, RN Outcome: Progressing Goal: Will not experience complications related to urinary retention 03/31/2022 2029 by Particia Lather, RN Outcome: Progressing 03/31/2022 0703 by Particia Lather, RN Outcome: Progressing   Problem: Pain Managment: Goal: General experience of comfort will improve 03/31/2022 2029 by Particia Lather, RN Outcome: Progressing 03/31/2022 0703 by Particia Lather, RN Outcome: Progressing   Problem: Safety: Goal: Ability to remain free from injury will improve 03/31/2022 2029 by Particia Lather, RN Outcome: Progressing 03/31/2022 0703 by Particia Lather, RN Outcome: Progressing   Problem: Skin Integrity: Goal: Risk for impaired skin integrity will decrease 03/31/2022 2029 by Particia Lather, RN Outcome: Progressing 03/31/2022 0703 by Particia Lather, RN Outcome: Progressing

## 2022-03-31 NOTE — Plan of Care (Signed)

## 2022-03-31 NOTE — Consult Note (Signed)
PODIATRY CONSULTATION  NAME [name] MRN [id number] DOB 1985/09/08 DOA 03/29/2022   Reason for consult: Osteomyelitis left great toe Chief Complaint  Patient presents with   Wound Check    Consulting physician: Lala Lund, MD, [facility]  History of present illness: 37 y.o. female PMHx DM1, ESRD on dialysis admitted for worsening infection left foot.  Patient states that the wound to her left great toe initially began with an accident which occurred at [facility] when she stepped on a piece of broken glass.  Patient is followed by wound care.  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0 - 15.0 g/dL 10.1  9.1  10.8   Hematocrit 36.0 - 46.0 % 33.5  29.5  35.0   Platelets 150 - 400 K/uL 175  170  221        Latest Ref Rng & Units 03/31/2022    3:25 AM 03/30/2022    6:27 AM 03/29/2022    2:24 PM  BMP  Glucose 70 - 99 mg/dL 140   148  242   BUN 6 - 20 mg/dL 33  47  39   Creatinine 0.44 - 1.00 mg/dL 8.76  11.03  9.44   Sodium 135 - 145 mmol/L 135  135  131   Potassium 3.5 - 5.1 mmol/L 5.0  4.9  5.5   Chloride 98 - 111 mmol/L 98  98  92   CO2 22 - 32 mmol/L 23  23  25    Calcium 8.9 - 10.3 mg/dL 8.1  7.8  8.4       Physical Exam: General: The patient is alert and oriented x3 in no acute distress.   Dermatology: Ulcer noted distal tip of the left great toe.  Foul-smelling.  Necrotic fibrotic debris noted to the wound base. Ulcer also noted to the plantar aspect of the left midfoot.  Granular wound base.  This ulcer appears actually somewhat stable.  Vascular: Edema noted left foot with significant warmth compared to contralateral limb  US ARTERIAL ABI LOWER EXTREMITY 03/30/2022 FINDINGS: Right ABI:  1.31 Left ABI:  1.30 Right Lower Extremity: Posterior tibial artery waveform is monophasic. Dorsalis pedis artery waveform is multiphasic. Left  Lower Extremity: Multiphasic waveforms are seen in the posterior tibial and dorsalis pedis arteries.  IMPRESSION: No definitive evidence of significant lower extremity arterial occlusive disease.  Neurological: Diminished via light touch  Musculoskeletal Exam: No structural deformity noted.  DG FOOT COMPLETE LT 03/29/2022: FINDINGS: Minimal degenerative change of the first MTP joint and midfoot. No focal bone destruction to suggest osteomyelitis. Mild soft tissue swelling over the mid to forefoot. No air within the soft tissues. Small inferior calcaneal spur.   IMPRESSION: 1. No acute findings. No evidence of osteomyelitis. 2. Mild soft tissue swelling.  MR FOOT LT WO CONTRAST 03/29/2022: IMPRESSION: 1. Osteomyelitis of the distal phalanx great toe, with surrounding fluid collection draining through the plantar ulceration. 2. Substantial arthropathy of the Lisfranc joint with erosions and subcortical marrow edema particularly involving the bases of the second, third,  and fourth metatarsals as well as the distal middle and lateral cuneiforms. The fourth metatarsal base is most involved, with substantial marrow edema tracking in the metaphysis and shaft, to such a degree that chronic osteomyelitis is not excluded (although arthropathy is favored given the involvement of the other regional osseous structures). 3. Substantial thickening and heterogeneous but primarily low signal in the plantar subcutaneous tissues of the foot extending along a 7.1 by 1.3 by 10.9 cm length, with loss of the normal fatty signal, possibly from chronic inflammation or fibrosis. 4. Hallux valgus. 5. Subcutaneous edema along dorsum of the foot, cellulitis not excluded.   ASSESSMENT/PLAN OF CARE 1.  Osteomyelitis left great toe with possible abscess/cellulitis left foot -Discussed with the patient the need for amputation of the left great toe due to the osteomyelitis and foul-smelling ulcer to eradicate the source of infection and salvage the foot.  Risk of infection explained to the patient and she was very emotional throughout our conversation.  Also spoke with her brother on the phone regarding patient's situation. -For now we will plan for surgery tomorrow, 04/01/2022.  Surgery will consist of left great toe amputation with possible bone biopsy. Possible incision and drainage left foot.  Preoperative orders placed -Risk benefits advantages and disadvantages were explained in detail to the patient.  No guarantees were expressed or implied.  She understands that because of the nature of the progressing infection/cellulitis she is at risk of more proximal limb loss if we do not surgically intervene promptly.  Again, patient very emotional and would like to discuss with her family -Will continue to follow   Thank you for the consult.  Please contact me directly via secure chat with any questions or concerns.     Edrick Kins, DPM [facility]  Dr. Edrick Kins, DPM     [address], [place] [address]                Office [phone]  Fax [phone]

## 2022-03-31 NOTE — Progress Notes (Signed)
PROGRESS NOTE                                                                                                                                                                                                             Patient Demographics:    [name], is a 37 y.o. female, DOB - 11/13/1985, PNT:[id number]  Outpatient Primary MD for the patient is [name], MD    LOS - 2  Admit date - 03/29/2022    Chief Complaint  Patient presents with   Wound Check       Brief Narrative (HPI from H&P)   -  37 y.o. female with medical history significant for type 1 diabetes, ESRD Tuesday Thursday Saturday,  seizures/pseudoseizures.  Patient presented to the ED with complaints of pain and swelling to her left lower foot , symptoms started with some pain to her left leg on Tuesday a week ago, 1/9.  2 days later she reports a blister on her left big toe suddenly opened up with drainage of foul-smelling discharge, she was admitted for left foot cellulitis with possible underlying osteomyelitis to [facility] and subsequently transferred to [facility] for further care.   Subjective:    [name] today has, No headache, No chest pain, No abdominal pain - No Nausea, No new weakness tingling or numbness, no SOB, +ve L foot pain.   Assessment  & Plan :    L. Diabetic foot ulcer ([place])  - Ulcer to big left toe with surrounding cellulitis, foul smell, with swelling, extending towards ankle, has been placed on empiric IV vancomycin and Zosyn, cultures being followed, will trend ESR and CRP, podiatry on board.  MRI noted with suspicion for osteomyelitis of the first greater toe distal phalanx along with some nonspecific involvement of multiple metatarsal bones, continue supportive care with pain control.  Defer further management to podiatry.  ESRD on hemodialysis ([place]) on TTS schedule.  Nephrology on board.  HX of seizures and  pseudoseizures.  Monitor clinically.  Not on any medications.   Anemia due to chronic kidney disease - Hemoglobin 10.8.  Stable.  Diabetes mellitus type 1 (HCC) -  Resume Tresiba/long-acting insulin at reduced dose 8u daily( Home dose ~10u)  Lab Results  Component Value Date   HGBA1C 9.1 (H) 03/30/2022   CBG (last 3)  Recent  Labs    03/30/22 1724 03/30/22 2009 03/31/22 0432  GLUCAP 115* 174* 203*        Condition - Fair  Family Communication  :  None  Code Status :  Full  Consults  :  Renal, Podiatry  PUD Prophylaxis : PPI   Procedures  :     MRI L Foot - Osteomyelitis of the distal phalanx great toe, with surrounding fluid collection draining through the plantar ulceration. 2. Substantial arthropathy of the Lisfranc joint with erosions and subcortical marrow edema particularly involving the bases of the second, third, and fourth metatarsals as well as the distal middle and lateral cuneiforms. The fourth metatarsal base is most involved, with substantial marrow edema tracking in the metaphysis and shaft, to such a degree that chronic osteomyelitis is not excluded (although arthropathy is favored given the involvement of the other regional osseous structures). 3. Substantial thickening and heterogeneous but primarily low signal in the plantar subcutaneous tissues of the foot extending along a 7.1 by 1.3 by 10.9 cm length, with loss of the normal fatty signal, possibly from chronic inflammation or fibrosis. 4. Hallux valgus. 5. Subcutaneous edema along dorsum of the foot, cellulitis not excluded.      Disposition Plan  :    Status is: Inpatient  DVT Prophylaxis  :    heparin injection 5,000 Units Start: 03/30/22 1400  Lab Results  Component Value Date   PLT 175 03/31/2022    Diet :  Diet Order             Diet renal/carb modified with fluid restriction Diet-HS Snack? Nothing; Fluid restriction: 1200 mL Fluid; Room service appropriate? Yes; Fluid consistency: Thin   Diet effective now                    Inpatient Medications  Scheduled Meds:  calcium acetate  1,334 mg Oral TID WC   calcium carbonate (dosed in mg elemental calcium)  500 mg of elemental calcium Oral Daily   doxercalciferol  2 mcg Intravenous Q T,Th,Sa-HD   famotidine  40 mg Oral Daily   gabapentin  600 mg Oral TID   heparin  5,000 Units Subcutaneous Q8H   insulin aspart  0-9 Units Subcutaneous Q4H   insulin glargine-yfgn  8 Units Subcutaneous Daily   pantoprazole  40 mg Oral Daily   sevelamer carbonate  2.4 g Oral TID WC   simvastatin  20 mg Oral QHS   Continuous Infusions:  piperacillin-tazobactam (ZOSYN)  IV 2.25 g (03/31/22 0812)   vancomycin 750 mg (03/30/22 1600)   PRN Meds:.acetaminophen **OR** acetaminophen, heparin, HYDROcodone-acetaminophen, HYDROmorphone (DILAUDID) injection, hydrOXYzine, lidocaine-prilocaine, metoprolol tartrate, pentafluoroprop-tetrafluoroeth, polyethylene glycol, tiZANidine    Objective:   Vitals:   03/30/22 1918 03/30/22 2213 03/31/22 0419 03/31/22 0746  BP:  (!) 139/98 (!) 148/95 103/78  Pulse: 93 (!) 106 100 (!) 103  Resp:  17 17 16   Temp:  99.5 F (37.5 C) 98.6 F (37 C) 99.8 F (37.7 C)  TempSrc:  Oral Oral Oral  SpO2: 100% 93% 100% 97%  Weight:  85 kg    Height:        Wt Readings from Last 3 Encounters:  03/30/22 85 kg  12/31/21 79.7 kg  10/02/21 78 kg     Intake/Output Summary (Last 24 hours) at 03/31/2022 0823 Last data filed at 03/30/2022 1700 Gross per 24 hour  Intake --  Output 1000 ml  Net -1000 ml     Physical Exam  Awake Alert, No new F.N deficits, Normal affect [place].AT,PERRAL Supple Neck, No JVD,   Symmetrical Chest wall movement, Good air movement bilaterally, CTAB RRR,No Gallops,Rubs or new Murmurs,  +ve B.Sounds, Abd Soft, No tenderness,   No Cyanosis, L foot as below       Data Review:    Recent Labs  Lab 03/29/22 1424 03/30/22 0627 03/31/22 0325  WBC 9.6 5.1 5.1  HGB 10.8* 9.1* 10.1*   HCT 35.0* 29.5* 33.5*  PLT 221 170 175  MCV 98.3 99.0 99.7  MCH 30.3 30.5 30.1  MCHC 30.9 30.8 30.1  RDW 16.6* 16.7* 16.4*  LYMPHSABS 1.1  --  1.1  MONOABS 0.3  --  0.4  EOSABS 0.3  --  0.3  BASOSABS 0.1  --  0.1    Recent Labs  Lab 03/29/22 1422 03/29/22 1424 03/30/22 0627 03/31/22 0325 03/31/22 0650  NA  --  131* 135 135  --   K  --  5.5* 4.9 5.0  --   CL  --  92* 98 98  --   CO2  --  25 23 23   --   ANIONGAP  --  14 14 14   --   GLUCOSE  --  242* 148* 140*  --   BUN  --  39* 47* 33*  --   CREATININE  --  9.44* 11.03* 8.76*  --   AST  --  15  --   --   --   ALT  --  11  --   --   --   ALKPHOS  --  59  --   --   --   BILITOT  --  0.6  --   --   --   ALBUMIN  --  3.9  --   --   --   CRP 19.2*  --   --   --   --   LATICACIDVEN  --  1.0  --   --   --   INR  --  1.1  --   --  1.1  HGBA1C  --   --  9.1*  --   --   CALCIUM  --  8.4* 7.8* 8.1*  --     Radiology Reports US ARTERIAL ABI (SCREENING LOWER EXTREMITY)  Result Date: 03/30/2022 CLINICAL DATA:  Diabetic foot ulcer Hyperlipidemia EXAM: NONINVASIVE PHYSIOLOGIC VASCULAR STUDY OF BILATERAL LOWER EXTREMITIES TECHNIQUE: Evaluation of both lower extremities were performed at rest, including calculation of ankle-brachial indices with single level pressure measurements and doppler recording. COMPARISON:  None available. FINDINGS: Right ABI:  1.31 Left ABI:  1.30 Right Lower Extremity: Posterior tibial artery waveform is monophasic. Dorsalis pedis artery waveform is multiphasic. Left Lower Extremity: Multiphasic waveforms are seen in the posterior tibial and dorsalis pedis arteries. 1.0-1.4 Normal IMPRESSION: No definitive evidence of significant lower extremity arterial occlusive disease. Electronically Signed   By: Miachel Roux M.D.   On: 03/30/2022 10:17   MR FOOT LEFT WO CONTRAST  Result Date: 03/29/2022 CLINICAL DATA:  Diabetic foot ulcer, purulent drainage. Sepsis. End-stage renal disease. Wound of the great toe. EXAM: MRI  OF THE LEFT FOOT WITHOUT CONTRAST TECHNIQUE: Multiplanar, multisequence MR imaging of the left forefoot was performed. No intravenous contrast was administered. COMPARISON:  radiographs 03/29/2022 FINDINGS: Bones/Joint/Cartilage There is abnormal marrow edema throughout the distal phalanx great toe with surrounding fluid around the periosteum of the tuft of the great toe, compatible with active osteomyelitis and periostitis. Substantial arthropathy the Lisfranc  joint with erosions and subcortical marrow edema particularly involving the bases of the second, third, and fourth metatarsals as well as the distal middle and lateral cuneiforms. The fourth metatarsal bases most involved, with substantial marrow edema tracking in the metaphysis and shaft, to such a degree that chronic osteomyelitis is not excluded Hallux valgus. Ligaments The Lisfranc ligament appears intact. Muscles and Tendons No intramuscular abscess or compelling evidence of myositis. Soft tissues Plantar ulceration of the great toe. There appears to be a fluid collection surrounding the tuft of the great toe draining through this plantar ulceration on image 12 series 7, further cementing the great toe involvement as being due to osteomyelitis. Subcutaneous edema along dorsum of the foot, cellulitis not excluded. Substantial thickening and heterogeneous but primarily low signal in the plantar subcutaneous tissues of the foot extending along a 7.1 by 1.3 by over 10.9 cm length, with loss of the normal fatty signal, possibly from chronic inflammation or fibrosis. I favor the former. IMPRESSION: 1. Osteomyelitis of the distal phalanx great toe, with surrounding fluid collection draining through the plantar ulceration. 2. Substantial arthropathy of the Lisfranc joint with erosions and subcortical marrow edema particularly involving the bases of the second, third, and fourth metatarsals as well as the distal middle and lateral cuneiforms. The fourth  metatarsal base is most involved, with substantial marrow edema tracking in the metaphysis and shaft, to such a degree that chronic osteomyelitis is not excluded (although arthropathy is favored given the involvement of the other regional osseous structures). 3. Substantial thickening and heterogeneous but primarily low signal in the plantar subcutaneous tissues of the foot extending along a 7.1 by 1.3 by 10.9 cm length, with loss of the normal fatty signal, possibly from chronic inflammation or fibrosis. 4. Hallux valgus. 5. Subcutaneous edema along dorsum of the foot, cellulitis not excluded. Electronically Signed   By: Van Clines M.D.   On: 03/29/2022 18:28   DG Foot Complete Left  Result Date: 03/29/2022 CLINICAL DATA:  Foot pain, wound/ulcer. EXAM: LEFT FOOT - COMPLETE 3+ VIEW COMPARISON:  02/26/2021 FINDINGS: Minimal degenerative change of the first MTP joint and midfoot. No focal bone destruction to suggest osteomyelitis. Mild soft tissue swelling over the mid to forefoot. No air within the soft tissues. Small inferior calcaneal spur. IMPRESSION: 1. No acute findings. No evidence of osteomyelitis. 2. Mild soft tissue swelling. Electronically Signed   By: Marin Olp M.D.   On: 03/29/2022 14:58   DG Chest Port 1 View  Result Date: 03/29/2022 CLINICAL DATA:  Foot pain/ulcer. EXAM: PORTABLE CHEST 1 VIEW COMPARISON:  12/27/2021 FINDINGS: Lungs are hypoinflated and otherwise clear. Cardiomediastinal silhouette and remainder of the exam is unchanged. IMPRESSION: Hypoinflation without acute cardiopulmonary disease. Electronically Signed   By: Marin Olp M.D.   On: 03/29/2022 14:52      Signature  -   Lala Lund M.D on 03/31/2022 at 8:23 AM   -  To page go to [email or web address]

## 2022-03-31 NOTE — Progress Notes (Signed)
PT Cancellation Note  Patient Details Name: [name] MRN: [id number] DOB: Aug 13, 1985   Cancelled Treatment:    Reason Eval/Treat Not Completed: Other (comment).  Pt has not yet received ortho shoe, will retry to see how her currently fairly independent gait is impacted by the shoe.   Ramond Dial 03/31/2022, 12:09 PM  Mee Hives, PT PhD Acute Rehab Dept. Number: [place] and [place]

## 2022-03-31 NOTE — Progress Notes (Signed)
Orthopedic Tech Progress Note Patient Details:  [name] 08/02/1985 [id number]  Ortho Devices Type of Ortho Device: Postop shoe/boot Ortho Device/Splint Location: Left foot Ortho Device/Splint Interventions: Application, Removal   Post Interventions Patient Tolerated: Well  [name] 03/31/2022, 5:43 PM

## 2022-03-31 NOTE — Progress Notes (Signed)
Physical Therapy Evaluation Patient Details Name: [name] MRN: [id number] DOB: 04-16-1985 Today's Date: 03/31/2022  History of Present Illness  37 yo female with onset of blister on L plantar surface of great toe was admitted 1/15 and noted osteomyelitis on imaging, as well as  substantial arthropathy of the Lisfranc joint with erosions and subcortical marrow edema involving the bases of the second, third, and fourth metatarsals as well as the distal middle and lateral cuneiforms.  PMHx: ESRD, HD, anemia, DM, seizures, XXX syndrome  Clinical Impression  Pt is up to walk today with no assist prior to PT arrival, and noted her gait is very mildly unsteady upon arrival.  Her ortho shoe has not arrived, but assessed her with SPC to walk on hallway.  Pt is able to steadily walk on cane, but would like to see tomorrow to assess the shoe and cane on the steps.  Follow acutely for goals of PT and will not need follow up PT as pt stands currently.  May change after intervention with podiatrist upon dc.       Recommendations for follow up therapy are one component of a multi-disciplinary discharge planning process, led by the attending physician.  Recommendations may be updated based on patient status, additional functional criteria and insurance authorization.  Follow Up Recommendations No PT follow up      Assistance Recommended at Discharge PRN  Patient can return home with the following  Assistance with cooking/housework    Equipment Recommendations Cane  Recommendations for Other Services       Functional Status Assessment Patient has had a recent decline in their functional status and/or demonstrates limited ability to make significant improvements in function in a reasonable and predictable amount of time     Precautions / Restrictions Precautions Precautions: Fall Precaution Comments: monitor wound on L foot plantar surface Restrictions Weight Bearing Restrictions: No       Mobility  Bed Mobility Overal bed mobility: Modified Independent                  Transfers Overall transfer level: Modified independent Equipment used: None                    Ambulation/Gait Ambulation/Gait assistance: Supervision Gait Distance (Feet): 140 Feet Assistive device: Straight cane Gait Pattern/deviations: Step-through pattern, Decreased stride length, Decreased weight shift to left Gait velocity: reduced Gait velocity interpretation: <1.31 ft/sec, indicative of household ambulator Pre-gait activities: standing balance ck General Gait Details: unloaded on LLE with SPC  Stairs            Wheelchair Mobility    Modified Rankin (Stroke Patients Only)       Balance Overall balance assessment: Modified Independent                                           Pertinent Vitals/Pain Pain Assessment Pain Assessment: Faces Faces Pain Scale: Hurts little more Pain Location: L foot Pain Descriptors / Indicators: Aching, Guarding Pain Intervention(s): Limited activity within patient's tolerance, Monitored during session, Premedicated before session, Repositioned    Home Living Family/patient expects to be discharged to:: Private residence Living Arrangements: Spouse/significant other Available Help at Discharge: Family;Available PRN/intermittently Type of Home: House Home Access: Stairs to enter Entrance Stairs-Rails: None Entrance Stairs-Number of Steps: 4   Home Layout: One level Home Equipment: None Additional Comments: has  not needed AD for gait up to this pint    Prior Function Prior Level of Function : Independent/Modified Independent                     Hand Dominance   Dominant Hand: Right    Extremity/Trunk Assessment   Upper Extremity Assessment Upper Extremity Assessment: Overall WFL for tasks assessed    Lower Extremity Assessment Lower Extremity Assessment: LLE deficits/detail LLE  Deficits / Details: poor foot sensation on L foot LLE Coordination: decreased gross motor    Cervical / Trunk Assessment Cervical / Trunk Assessment: Normal  Communication   Communication: No difficulties  Cognition Arousal/Alertness: Awake/alert Behavior During Therapy: WFL for tasks assessed/performed Overall Cognitive Status: Within Functional Limits for tasks assessed                                          General Comments General comments (skin integrity, edema, etc.): pt is mildly unsteady with no AD and more laterally controlled on [facility]    Exercises     Assessment/Plan    PT Assessment Patient needs continued PT services  PT Problem List Decreased strength;Decreased balance;Decreased range of motion;Decreased knowledge of use of DME;Decreased skin integrity;Pain       PT Treatment Interventions DME instruction;Gait training;Stair training;Functional mobility training;Therapeutic activities;Therapeutic exercise;Balance training;Neuromuscular re-education;Patient/family education    PT Goals (Current goals can be found in the Care Plan section)  Acute Rehab PT Goals Patient Stated Goal: to go home and L foot to stop hurting PT Goal Formulation: With patient Time For Goal Achievement: 04/07/22 Potential to Achieve Goals: Good    Frequency Min 3X/week     Co-evaluation               AM-PAC PT "6 Clicks" Mobility  Outcome Measure Help needed turning from your back to your side while in a flat bed without using bedrails?: None Help needed moving from lying on your back to sitting on the side of a flat bed without using bedrails?: None Help needed moving to and from a bed to a chair (including a wheelchair)?: A Little Help needed standing up from a chair using your arms (e.g., wheelchair or bedside chair)?: A Little Help needed to walk in hospital room?: A Little Help needed climbing 3-5 steps with a railing? : A Little 6 Click Score: 20     End of Session Equipment Utilized During Treatment: Gait belt Activity Tolerance: Patient tolerated treatment well Patient left: in chair;with call bell/phone within reach Nurse Communication: Mobility status PT Visit Diagnosis: Unsteadiness on feet (R26.81);Pain Pain - Right/Left: Left Pain - part of body: Ankle and joints of foot    Time: 4010-2725 PT Time Calculation (min) (ACUTE ONLY): 25 min   Charges:   PT Evaluation $PT Eval Moderate Complexity: 1 Mod PT Treatments $Gait Training: 8-22 mins       Ramond Dial 03/31/2022, 3:52 PM  Mee Hives, PT PhD Acute Rehab Dept. Number: [facility] [place]

## 2022-03-31 NOTE — Care Management (Signed)
  Transition of Care [facility]) Screening Note   Patient Details  Name: [name] Date of Birth: [birth year]   Transition of Care [facility]) CM/SW Contact:    Carles Collet, RN Phone Number: 03/31/2022, 3:33 PM    Transition of Care Department [facility]) has reviewed patient and no TOC needs have been identified at this time. We will continue to monitor patient advancement through interdisciplinary progression rounds. If new patient transition needs arise, please place a TOC consult.

## 2022-04-01 ENCOUNTER — Encounter (HOSPITAL_COMMUNITY)
Admission: EM | Disposition: A | Discharge status: Home Health | Payer: Self-pay | Source: Ambulatory Visit | Attending: Internal Medicine

## 2022-04-01 ENCOUNTER — Inpatient Hospital Stay (HOSPITAL_COMMUNITY): Payer: 59

## 2022-04-01 DIAGNOSIS — L97529 Non-pressure chronic ulcer of other part of left foot with unspecified severity: Secondary | ICD-10-CM | POA: Diagnosis not present

## 2022-04-01 DIAGNOSIS — E10621 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: Secondary | ICD-10-CM | POA: Diagnosis not present

## 2022-04-01 LAB — CBC WITH DIFFERENTIAL/PLATELET
Abs Immature Granulocytes: 0.02 K/uL (ref 0.00–0.07)
Basophils Absolute: 0.1 K/uL (ref 0.0–0.1)
Basophils Relative: 1 %
Eosinophils Absolute: 0.4 K/uL (ref 0.0–0.5)
Eosinophils Relative: 8 %
HCT: 29.5 % — ABNORMAL LOW (ref 36.0–46.0)
Hemoglobin: 9.3 g/dL — ABNORMAL LOW (ref 12.0–15.0)
Immature Granulocytes: 0 %
Lymphocytes Relative: 31 %
Lymphs Abs: 1.4 K/uL (ref 0.7–4.0)
MCH: 30.8 pg (ref 26.0–34.0)
MCHC: 31.5 g/dL (ref 30.0–36.0)
MCV: 97.7 fL (ref 80.0–100.0)
Monocytes Absolute: 0.4 K/uL (ref 0.1–1.0)
Monocytes Relative: 9 %
Neutro Abs: 2.4 K/uL (ref 1.7–7.7)
Neutrophils Relative %: 51 %
Platelets: 167 K/uL (ref 150–400)
RBC: 3.02 MIL/uL — ABNORMAL LOW (ref 3.87–5.11)
RDW: 16.3 % — ABNORMAL HIGH (ref 11.5–15.5)
WBC: 4.7 K/uL (ref 4.0–10.5)
nRBC: 0 % (ref 0.0–0.2)

## 2022-04-01 LAB — BASIC METABOLIC PANEL WITH GFR
Anion gap: 15 (ref 5–15)
BUN: 45 mg/dL — ABNORMAL HIGH (ref 6–20)
CO2: 25 mmol/L (ref 22–32)
Calcium: 8.5 mg/dL — ABNORMAL LOW (ref 8.9–10.3)
Chloride: 97 mmol/L — ABNORMAL LOW (ref 98–111)
Creatinine, Ser: 10.69 mg/dL — ABNORMAL HIGH (ref 0.44–1.00)
GFR, Estimated: 4 mL/min — ABNORMAL LOW (ref >60)
Glucose, Bld: 176 mg/dL — ABNORMAL HIGH (ref 70–99)
Potassium: 4.7 mmol/L (ref 3.5–5.1)
Sodium: 137 mmol/L (ref 135–145)

## 2022-04-01 LAB — GLUCOSE, CAPILLARY
Glucose-Capillary: 108 mg/dL — ABNORMAL HIGH (ref 70–99)
Glucose-Capillary: 124 mg/dL — ABNORMAL HIGH (ref 70–99)
Glucose-Capillary: 152 mg/dL — ABNORMAL HIGH (ref 70–99)
Glucose-Capillary: 166 mg/dL — ABNORMAL HIGH (ref 70–99)
Glucose-Capillary: 197 mg/dL — ABNORMAL HIGH (ref 70–99)
Glucose-Capillary: 250 mg/dL — ABNORMAL HIGH (ref 70–99)

## 2022-04-01 LAB — BRAIN NATRIURETIC PEPTIDE: B Natriuretic Peptide: 381.3 pg/mL — ABNORMAL HIGH (ref 0.0–100.0)

## 2022-04-01 SURGERY — AMPUTATION, FOOT, RAY
Anesthesia: Choice | Laterality: Left

## 2022-04-01 MED ORDER — INSULIN GLARGINE-YFGN 100 UNIT/ML ~~LOC~~ SOLN
4.0000 [IU] | Freq: Every day | SUBCUTANEOUS | Status: DC
  Administered 2022-04-01: 4 [IU] via SUBCUTANEOUS
  Filled 2022-04-01 (×3): qty 0.04

## 2022-04-01 MED ORDER — FAMOTIDINE 10 MG PO TABS
10.0000 mg | ORAL_TABLET | Freq: Every day | ORAL | Status: DC
  Administered 2022-04-03 – 2022-04-04 (×2): 10 mg via ORAL
  Filled 2022-04-01 (×2): qty 1

## 2022-04-01 MED ORDER — INSULIN ASPART 100 UNIT/ML IJ SOLN
0.0000 [IU] | Freq: Three times a day (TID) | INTRAMUSCULAR | Status: DC
  Administered 2022-04-01: 3 [IU] via SUBCUTANEOUS
  Administered 2022-04-02 (×2): 2 [IU] via SUBCUTANEOUS
  Administered 2022-04-02: 3 [IU] via SUBCUTANEOUS
  Administered 2022-04-03: 9 [IU] via SUBCUTANEOUS
  Administered 2022-04-04: 3 [IU] via SUBCUTANEOUS
  Administered 2022-04-04: 1 [IU] via SUBCUTANEOUS

## 2022-04-01 MED ORDER — LIDOCAINE HCL (PF) 1 % IJ SOLN: 5.0000 mL | INTRAMUSCULAR | Status: DC | PRN

## 2022-04-01 MED ORDER — ANTICOAGULANT SODIUM CITRATE 4% (200MG/5ML) IV SOLN: 5.0000 mL | Status: DC | PRN

## 2022-04-01 MED ORDER — HEPARIN SODIUM (PORCINE) 1000 UNIT/ML IJ SOLN
INTRAMUSCULAR | Status: AC
  Administered 2022-04-01: 2000 [IU]
  Filled 2022-04-01: qty 4

## 2022-04-01 MED ORDER — HEPARIN SODIUM (PORCINE) 1000 UNIT/ML DIALYSIS
4000.0000 [IU] | INTRAMUSCULAR | Status: DC | PRN
  Administered 2022-04-01: 4000 [IU] via INTRAVENOUS_CENTRAL

## 2022-04-01 MED ORDER — HEPARIN SODIUM (PORCINE) 1000 UNIT/ML DIALYSIS
1000.0000 [IU] | INTRAMUSCULAR | Status: DC | PRN
  Filled 2022-04-01: qty 1

## 2022-04-01 MED ORDER — FENTANYL CITRATE PF 50 MCG/ML IJ SOSY
25.0000 ug | PREFILLED_SYRINGE | INTRAMUSCULAR | Status: DC | PRN
  Administered 2022-04-01 – 2022-04-02 (×4): 25 ug via INTRAVENOUS
  Filled 2022-04-01 (×4): qty 1

## 2022-04-01 NOTE — Progress Notes (Signed)
PROGRESS NOTE                                                                                                                                                                                                             Patient Demographics:    [name], is a 37 y.o. female, DOB - 24-Jun-1985, MHD:[id number]  Outpatient Primary MD for the patient is [name], MD    LOS - 3  Admit date - 03/29/2022    Chief Complaint  Patient presents with   Wound Check       Brief Narrative (HPI from H&P)   -  37 y.o. female with medical history significant for type 1 diabetes, ESRD Tuesday Thursday Saturday,  seizures/pseudoseizures.  Patient presented to the ED with complaints of pain and swelling to her left lower foot , symptoms started with some pain to her left leg on Tuesday a week ago, 1/9.  2 days later she reports a blister on her left big toe suddenly opened up with drainage of foul-smelling discharge, she was admitted for left foot cellulitis with possible underlying osteomyelitis to [facility] and subsequently transferred to [facility] for further care.   Subjective:   Patient in bed, appears comfortable, denies any headache, no fever, no chest pain or pressure, no shortness of breath , no abdominal pain. No new focal weakness.  Due for HD today wants more fentanyl for pain control.   Assessment  & Plan :    L. Diabetic foot ulcer ([place])  - Ulcer to big left toe with surrounding cellulitis, foul smell, with swelling, extending towards ankle, has been placed on empiric IV vancomycin and Zosyn, cultures being followed, will trend ESR and CRP, podiatry on board.  MRI noted with suspicion for osteomyelitis of the first greater toe distal phalanx along with some nonspecific involvement of multiple metatarsal bones, continue supportive care with pain control.  Defer further management to podiatry, seen by Dr. Amalia Hailey Case  discussed with him patient not certain about the surgery yet will defer this to patient and podiatry.  ESRD on hemodialysis ([place]) on TTS schedule.  Nephrology on board.  HX of seizures and pseudoseizures.  Monitor clinically.  Not on any medications.   Anemia due to chronic kidney disease - Hemoglobin 10.8.  Stable.  Diabetes mellitus type 1 ([place]) -  Resume Tresiba/long-acting insulin at reduced dose 8u daily( Home dose ~10u)  Lab Results  Component Value Date   HGBA1C 9.1 (H) 03/30/2022   CBG (last 3)  Recent Labs    03/31/22 1948 04/01/22 0036 04/01/22 0501  GLUCAP 199* 166* 197*        Condition - Fair  Family Communication  :  None  Code Status :  Full  Consults  :  Renal, Podiatry  PUD Prophylaxis : PPI   Procedures  :     MRI L Foot - Osteomyelitis of the distal phalanx great toe, with surrounding fluid collection draining through the plantar ulceration. 2. Substantial arthropathy of the Lisfranc joint with erosions and subcortical marrow edema particularly involving the bases of the second, third, and fourth metatarsals as well as the distal middle and lateral cuneiforms. The fourth metatarsal base is most involved, with substantial marrow edema tracking in the metaphysis and shaft, to such a degree that chronic osteomyelitis is not excluded (although arthropathy is favored given the involvement of the other regional osseous structures). 3. Substantial thickening and heterogeneous but primarily low signal in the plantar subcutaneous tissues of the foot extending along a 7.1 by 1.3 by 10.9 cm length, with loss of the normal fatty signal, possibly from chronic inflammation or fibrosis. 4. Hallux valgus. 5. Subcutaneous edema along dorsum of the foot, cellulitis not excluded.      Disposition Plan  :    Status is: Inpatient  DVT Prophylaxis  :    heparin injection 5,000 Units Start: 03/30/22 1400  Lab Results  Component Value Date   PLT 167 04/01/2022    Diet  :  Diet Order             Diet NPO time specified  Diet effective midnight           Diet renal/carb modified with fluid restriction Diet-HS Snack? Nothing; Fluid restriction: 1200 mL Fluid; Room service appropriate? Yes; Fluid consistency: Thin  Diet effective now                    Inpatient Medications  Scheduled Meds:  calcium acetate  1,334 mg Oral TID WC   calcium carbonate (dosed in mg elemental calcium)  500 mg of elemental calcium Oral Daily   doxercalciferol  2 mcg Intravenous Q T,Th,Sa-HD   famotidine  40 mg Oral Daily   gabapentin  600 mg Oral TID   heparin  5,000 Units Subcutaneous Q8H   heparin sodium (porcine)       insulin aspart  0-9 Units Subcutaneous TID WC   insulin glargine-yfgn  4 Units Subcutaneous Daily   pantoprazole  40 mg Oral Daily   sevelamer carbonate  2.4 g Oral TID WC   simvastatin  20 mg Oral QHS   Continuous Infusions:  anticoagulant sodium citrate     piperacillin-tazobactam (ZOSYN)  IV 2.25 g (04/01/22 0634)   vancomycin 750 mg (03/30/22 1600)   PRN Meds:.acetaminophen **OR** acetaminophen, anticoagulant sodium citrate, fentaNYL (SUBLIMAZE) injection, heparin, heparin, heparin, heparin sodium (porcine), HYDROcodone-acetaminophen, hydrOXYzine, lidocaine (PF), lidocaine-prilocaine, metoprolol tartrate, pentafluoroprop-tetrafluoroeth, polyethylene glycol, tiZANidine    Objective:   Vitals:   04/01/22 0501 04/01/22 0816 04/01/22 0830 04/01/22 0836  BP: 109/78 (!) 156/99 (!) 138/102 (!) 145/99  Pulse: 79 87 88 82  Resp:  14 18 14   Temp: 98.6 F (37 C) 98.4 F (36.9 C)    TempSrc:  Oral    SpO2: 95% 97% 99% 99%  Weight:  85.1 kg    Height:        Wt Readings from Last 3 Encounters:  04/01/22 85.1 kg  12/31/21 79.7 kg  10/02/21 78 kg    No intake or output data in the 24 hours ending 04/01/22 0840    Physical Exam  Awake Alert, No new F.N deficits, Normal affect [place].AT,PERRAL Supple Neck, No JVD,   Symmetrical Chest  wall movement, Good air movement bilaterally, CTAB RRR,No Gallops,Rubs or new Murmurs,  +ve B.Sounds, Abd Soft, No tenderness,   No Cyanosis, L foot as below       Data Review:    Recent Labs  Lab 03/29/22 1424 03/30/22 0627 03/31/22 0325 04/01/22 0453  WBC 9.6 5.1 5.1 4.7  HGB 10.8* 9.1* 10.1* 9.3*  HCT 35.0* 29.5* 33.5* 29.5*  PLT 221 170 175 167  MCV 98.3 99.0 99.7 97.7  MCH 30.3 30.5 30.1 30.8  MCHC 30.9 30.8 30.1 31.5  RDW 16.6* 16.7* 16.4* 16.3*  LYMPHSABS 1.1  --  1.1 1.4  MONOABS 0.3  --  0.4 0.4  EOSABS 0.3  --  0.3 0.4  BASOSABS 0.1  --  0.1 0.1    Recent Labs  Lab 03/29/22 1422 03/29/22 1424 03/30/22 0627 03/31/22 0325 03/31/22 0650 04/01/22 0453  NA  --  131* 135 135  --  137  K  --  5.5* 4.9 5.0  --  4.7  CL  --  92* 98 98  --  97*  CO2  --  25 23 23   --  25  ANIONGAP  --  14 14 14   --  15  GLUCOSE  --  242* 148* 140*  --  176*  BUN  --  39* 47* 33*  --  45*  CREATININE  --  9.44* 11.03* 8.76*  --  10.69*  AST  --  15  --   --   --   --   ALT  --  11  --   --   --   --   ALKPHOS  --  59  --   --   --   --   BILITOT  --  0.6  --   --   --   --   ALBUMIN  --  3.9  --   --   --   --   CRP 19.2*  --   --   --  14.3*  --   LATICACIDVEN  --  1.0  --   --   --   --   INR  --  1.1  --   --  1.1  --   HGBA1C  --   --  9.1*  --   --   --   BNP  --   --   --   --   --  381.3*  CALCIUM  --  8.4* 7.8* 8.1*  --  8.5*    Radiology Reports US ARTERIAL ABI (SCREENING LOWER EXTREMITY)  Result Date: 03/30/2022 CLINICAL DATA:  Diabetic foot ulcer Hyperlipidemia EXAM: NONINVASIVE PHYSIOLOGIC VASCULAR STUDY OF BILATERAL LOWER EXTREMITIES TECHNIQUE: Evaluation of both lower extremities were performed at rest, including calculation of ankle-brachial indices with single level pressure measurements and doppler recording. COMPARISON:  None available. FINDINGS: Right ABI:  1.31 Left ABI:  1.30 Right Lower Extremity: Posterior tibial artery waveform is monophasic.  Dorsalis pedis artery waveform is multiphasic. Left Lower Extremity: Multiphasic waveforms are seen in the posterior tibial and dorsalis pedis  arteries. 1.0-1.4 Normal IMPRESSION: No definitive evidence of significant lower extremity arterial occlusive disease. Electronically Signed   By: Miachel Roux M.D.   On: 03/30/2022 10:17   MR FOOT LEFT WO CONTRAST  Result Date: 03/29/2022 CLINICAL DATA:  Diabetic foot ulcer, purulent drainage. Sepsis. End-stage renal disease. Wound of the great toe. EXAM: MRI OF THE LEFT FOOT WITHOUT CONTRAST TECHNIQUE: Multiplanar, multisequence MR imaging of the left forefoot was performed. No intravenous contrast was administered. COMPARISON:  radiographs 03/29/2022 FINDINGS: Bones/Joint/Cartilage There is abnormal marrow edema throughout the distal phalanx great toe with surrounding fluid around the periosteum of the tuft of the great toe, compatible with active osteomyelitis and periostitis. Substantial arthropathy the Lisfranc joint with erosions and subcortical marrow edema particularly involving the bases of the second, third, and fourth metatarsals as well as the distal middle and lateral cuneiforms. The fourth metatarsal bases most involved, with substantial marrow edema tracking in the metaphysis and shaft, to such a degree that chronic osteomyelitis is not excluded Hallux valgus. Ligaments The Lisfranc ligament appears intact. Muscles and Tendons No intramuscular abscess or compelling evidence of myositis. Soft tissues Plantar ulceration of the great toe. There appears to be a fluid collection surrounding the tuft of the great toe draining through this plantar ulceration on image 12 series 7, further cementing the great toe involvement as being due to osteomyelitis. Subcutaneous edema along dorsum of the foot, cellulitis not excluded. Substantial thickening and heterogeneous but primarily low signal in the plantar subcutaneous tissues of the foot extending along a 7.1 by 1.3  by over 10.9 cm length, with loss of the normal fatty signal, possibly from chronic inflammation or fibrosis. I favor the former. IMPRESSION: 1. Osteomyelitis of the distal phalanx great toe, with surrounding fluid collection draining through the plantar ulceration. 2. Substantial arthropathy of the Lisfranc joint with erosions and subcortical marrow edema particularly involving the bases of the second, third, and fourth metatarsals as well as the distal middle and lateral cuneiforms. The fourth metatarsal base is most involved, with substantial marrow edema tracking in the metaphysis and shaft, to such a degree that chronic osteomyelitis is not excluded (although arthropathy is favored given the involvement of the other regional osseous structures). 3. Substantial thickening and heterogeneous but primarily low signal in the plantar subcutaneous tissues of the foot extending along a 7.1 by 1.3 by 10.9 cm length, with loss of the normal fatty signal, possibly from chronic inflammation or fibrosis. 4. Hallux valgus. 5. Subcutaneous edema along dorsum of the foot, cellulitis not excluded. Electronically Signed   By: Van Clines M.D.   On: 03/29/2022 18:28   DG Foot Complete Left  Result Date: 03/29/2022 CLINICAL DATA:  Foot pain, wound/ulcer. EXAM: LEFT FOOT - COMPLETE 3+ VIEW COMPARISON:  02/26/2021 FINDINGS: Minimal degenerative change of the first MTP joint and midfoot. No focal bone destruction to suggest osteomyelitis. Mild soft tissue swelling over the mid to forefoot. No air within the soft tissues. Small inferior calcaneal spur. IMPRESSION: 1. No acute findings. No evidence of osteomyelitis. 2. Mild soft tissue swelling. Electronically Signed   By: Marin Olp M.D.   On: 03/29/2022 14:58   DG Chest Port 1 View  Result Date: 03/29/2022 CLINICAL DATA:  Foot pain/ulcer. EXAM: PORTABLE CHEST 1 VIEW COMPARISON:  12/27/2021 FINDINGS: Lungs are hypoinflated and otherwise clear. Cardiomediastinal  silhouette and remainder of the exam is unchanged. IMPRESSION: Hypoinflation without acute cardiopulmonary disease. Electronically Signed   By: Marin Olp M.D.   On: 03/29/2022  14:52      Signature  -   Lala Lund M.D on 04/01/2022 at 8:40 AM   -  To page go to [email or web address]

## 2022-04-01 NOTE — Progress Notes (Signed)
PODIATRY PROGRESS NOTE Patient Name: [name]  DOB 07/27/1985 DOA 03/29/2022  Hospital Day: 4  Assessment:  37 y.o. female with PMHx DM1, ESRD on HD, admitted for Left foot infection found to have ulceration left hallux with underlying osteomyelitis .  AF, VSS  WBC: 4.7 ESR/CRP: 88/ 14.3  Wound/Bone Cultures: pending OR  Imaging: MRI L foot: Osteomyelitis of the distal phalanx great toe, with surrounding fluid collection draining through the plantar ulceration.  Plan:  - Recommend Left hallux amputation for osteo and abscess - Upon further discussion with pt while in HD today, she is now stating she does not want the toe ampuatated. We discussed risk of non healing wound, worsening infection, blood infection, sepsis. She is aware but thinks toe can be saved. I said this is not recommended given extent of infection and low likelihood of success.  - Continue IV abx pending further culture data, Rec ID consult for recs - WBAT to Left foot in post op shoe - Betadine dressing to the left hallux daily - Will sign off at this time. Should patient elect to proceed with amputation tmrw, could be done over weekend possibly. Please reconsult as needed vs have pt follow up outpatient         Everitt Amber, DPM [facility]    Subjective:  Pt seen In HD. Discusses plan for amputation today. Pt states does not want amputation. Aware of risks associated with this decision.   Objective:   Vitals:   04/01/22 1000 04/01/22 1030  BP: 130/88   Pulse: 88   Resp: 15   Temp:    SpO2: 94% 91%       Latest Ref Rng & Units 04/01/2022    4:53 AM 03/31/2022    3:25 AM 03/30/2022    6:27 AM  CBC  WBC 4.0 - 10.5 K/uL 4.7  5.1  5.1   Hemoglobin 12.0 - 15.0 g/dL 9.3  10.1  9.1   Hematocrit 36.0 - 46.0 % 29.5  33.5  29.5   Platelets 150 - 400 K/uL 167  175  170        Latest Ref Rng & Units 04/01/2022    4:53 AM 03/31/2022    3:25 AM 03/30/2022    6:27 AM  BMP  Glucose  70 - 99 mg/dL 176  140  148   BUN 6 - 20 mg/dL 45  33  47   Creatinine 0.44 - 1.00 mg/dL 10.69  8.76  11.03   Sodium 135 - 145 mmol/L 137  135  135   Potassium 3.5 - 5.1 mmol/L 4.7  5.0  4.9   Chloride 98 - 111 mmol/L 97  98  98   CO2 22 - 32 mmol/L 25  23  23    Calcium 8.9 - 10.3 mg/dL 8.5  8.1  7.8     General: AAOx3, NAD  Lower Extremity Exam    Physical Exam: General: The patient is alert and oriented x3 in no acute distress.    Dermatology: Ulcer noted distal tip of the left great toe.  Foul-smelling.  Necrotic fibrotic debris noted to the wound base. Ulcer also noted to the plantar aspect of the left midfoot.  Granular wound base.  This ulcer appears actually somewhat stable.   Vascular: Edema noted left foot with significant warmth compared to contralateral limb   DG FOOT COMPLETE LT 03/29/2022: FINDINGS: Minimal degenerative change of the first MTP joint and midfoot. No  focal bone destruction to suggest osteomyelitis. Mild soft tissue swelling over the mid to forefoot. No air within the soft tissues. Small inferior calcaneal spur.   IMPRESSION: 1. No acute findings. No evidence of osteomyelitis. 2. Mild soft tissue swelling.   MR FOOT LT WO CONTRAST 03/29/2022: IMPRESSION: 1. Osteomyelitis of the distal phalanx great toe, with surrounding fluid collection draining through the plantar ulceration. 2. Substantial arthropathy of the Lisfranc joint with erosions and subcortical marrow edema particularly involving the bases of the second, third, and fourth metatarsals as well as the distal middle and lateral cuneiforms. The fourth metatarsal base is most involved, with substantial marrow edema tracking in the metaphysis and shaft, to such a degree that chronic osteomyelitis is not excluded (although arthropathy is favored given the involvement of the other regional osseous structures). 3. Substantial thickening and heterogeneous but primarily low signal in the plantar subcutaneous  tissues of the foot extending along a 7.1 by 1.3 by 10.9 cm length, with loss of the normal fatty signal, possibly from chronic inflammation or fibrosis. 4. Hallux valgus. 5. Subcutaneous edema along dorsum of the foot, cellulitis not excluded.      Radiology:  Results reviewed. See assessment for pertinent imaging results

## 2022-04-01 NOTE — Progress Notes (Signed)
Patient very non compliant with diet. Very upset that she can't get what she wants from the cafeteria. Patient said she will order food out. MD is aware.

## 2022-04-01 NOTE — Progress Notes (Signed)
Pt receives out-pt HD at [facility] on TTS. Will assist as needed.   Melven Sartorius Renal Navigator [phone]

## 2022-04-01 NOTE — Procedures (Signed)
Patient seen and examined on Hemodialysis. The procedure was supervised and I have made appropriate changes. BP (!) 130/92   Pulse 87   Temp 98.4 F (36.9 C) (Oral)   Resp 16   Ht 5\' 6"  (1.676 m)   Wt 85.1 kg   SpO2 91%   BMI 30.28 kg/m   QB 300 mL/ min via AVG , UF goal 2L  Tolerating treatment without complaints at this time.   Madelon Lips MD [facility] [phone] 9:47 AM

## 2022-04-01 NOTE — Progress Notes (Signed)
[facility] Progress Note   Subjective: Seen on dialysis.  Hoping not to have to have an amputation.    Objective Vitals:   04/01/22 0836 04/01/22 0844 04/01/22 0900 04/01/22 0930  BP: (!) 145/99 (!) 144/97 (!) 168/115 (!) 130/92  Pulse: 82 86 95 87  Resp: 14 20 (!) 21 16  Temp:      TempSrc:      SpO2: 99% 91% 92% 91%  Weight:      Height:         Additional Objective Labs: Basic Metabolic Panel: Recent Labs  Lab 03/30/22 0627 03/31/22 0325 04/01/22 0453  NA 135 135 137  K 4.9 5.0 4.7  CL 98 98 97*  CO2 23 23 25   GLUCOSE 148* 140* 176*  BUN 47* 33* 45*  CREATININE 11.03* 8.76* 10.69*  CALCIUM 7.8* 8.1* 8.5*   CBC: Recent Labs  Lab 03/29/22 1424 03/30/22 0627 03/31/22 0325 04/01/22 0453  WBC 9.6 5.1 5.1 4.7  NEUTROABS 7.9*  --  3.1 2.4  HGB 10.8* 9.1* 10.1* 9.3*  HCT 35.0* 29.5* 33.5* 29.5*  MCV 98.3 99.0 99.7 97.7  PLT 221 170 175 167   Blood Culture    Component Value Date/Time   SDES BLOOD RIGHT HAND 03/29/2022 1424   SDES BLOOD RIGHT ARM 03/29/2022 1424   SPECREQUEST  03/29/2022 1424    BOTTLES DRAWN AEROBIC AND ANAEROBIC Blood Culture adequate volume   SPECREQUEST  03/29/2022 1424    BOTTLES DRAWN AEROBIC AND ANAEROBIC Blood Culture adequate volume   CULT  03/29/2022 1424    NO GROWTH 3 DAYS Performed at [facility], [address], [place], [place] [address]    CULT  03/29/2022 1424    NO GROWTH 3 DAYS Performed at [facility], [address] [place], [place] [address]    REPTSTATUS PENDING 03/29/2022 1424   REPTSTATUS PENDING 03/29/2022 1424     Physical Exam General: Well appearing, nad  Heart: RRR  Lungs: Clear bilaterally  Abdomen: soft non -tender  Extremities: No sig LE edema; L foot ulcers on bottom of foot and L great toe Dialysis Access: LUE AVG +bruit   Medications:  anticoagulant sodium citrate     piperacillin-tazobactam (ZOSYN)  IV 2.25 g (04/01/22 0634)   vancomycin 750 mg (03/30/22 1600)     calcium acetate  1,334 mg Oral TID WC   calcium carbonate (dosed in mg elemental calcium)  500 mg of elemental calcium Oral Daily   doxercalciferol  2 mcg Intravenous Q T,Th,Sa-HD   famotidine  40 mg Oral Daily   gabapentin  600 mg Oral TID   heparin  5,000 Units Subcutaneous Q8H   heparin sodium (porcine)       insulin aspart  0-9 Units Subcutaneous TID WC   insulin glargine-yfgn  4 Units Subcutaneous Daily   pantoprazole  40 mg Oral Daily   sevelamer carbonate  2.4 g Oral TID WC   simvastatin  20 mg Oral QHS    Dialysis Orders:  AF-  TTS 4 hours  EDW 79. HD Bath 2K/3calc, Dialyzer 180, Heparin yes-  4000 then 2500 mid. Access AVG-  16 gauge - 300 BFR.  Last HD Sat-  left out 80.6-  likely needs EDW up-  last hgb 10.8, phos 6.2, pth 89  hect 2 q tx, mircera 3  Assessment/Plan: 1. Left foot ulcer - Had trauma to foot. MRI with suspicion for osteo; IV vanc/Zosyn. Per primary/podiatry.  Recommending amputation of L great toe-  pt is considering 2. ESRD - HD TTS - HD 1/18 uses 16g needles  3. HTN/volume-  BP/volume stable  4. Anemia-  Hb 10.1  Follow trends. No ESA currently  5. MBD of CKD -  Continue hectorol/Renvela binder 6. Nutrition - Renal diet with fluid restriction   [facility] 04/01/2022,9:45 AM

## 2022-04-01 NOTE — Progress Notes (Signed)
Received patient in bed to unit.  Alert and oriented.  Informed consent signed and in chart.   Treatment initiated: 0816 Treatment completed: 1212  Patient tolerated well.  Transported back to the room  Alert, without acute distress.  Hand-off given to patient's nurse.   Access used: AVG Access issues: n/a  Total UF removed: 2000 Medication(s) given: fentanyl, heparin, hectaral Post HD weight: 83.0kg    04/01/22 1212  Vitals  Temp 98.9 F (37.2 C)  Temp Source Oral  BP (!) 149/91  MAP (mmHg) 109  BP Location Right Arm  BP Method Automatic  Patient Position (if appropriate) Lying  Pulse Rate Source Monitor  Oxygen Therapy  O2 Device Room Air  During Treatment Monitoring  Blood Flow Rate (mL/min) 400 mL/min  Arterial Pressure (mmHg) -190 mmHg  Venous Pressure (mmHg) 280 mmHg  TMP (mmHg) 16 mmHg  Ultrafiltration Rate (mL/min) 990 mL/min  Dialysate Flow Rate (mL/min) 300 ml/min  HD Safety Checks Performed Yes  Intra-Hemodialysis Comments Tx completed  Post Treatment  Duration of HD Treatment -hour(s) 3.5 hour(s)  Liters Processed 84  Fluid Removed (mL) 2 mL  Tolerated HD Treatment Yes  AVG/AVF Arterial Site Held (minutes) 10 minutes  AVG/AVF Venous Site Held (minutes) 10 minutes  Fistula / Graft Left Upper arm Arteriovenous vein graft  No Placement Date or Time found.   Placed prior to admission: Yes  Orientation: Left  Access Location: Upper arm  Access Type: Arteriovenous vein graft  Site Condition No complications  Fistula / Graft Assessment Present;Thrill;Bruit          [facility]

## 2022-04-01 NOTE — Evaluation (Signed)
Occupational Therapy Evaluation and Discharge  Patient Details Name: [name] MRN: [id number] DOB: 21-Dec-1985 Today's Date: 04/01/2022   History of Present Illness 37 yo female with onset of blister on L plantar surface of great toe was admitted 1/15 and noted osteomyelitis on imaging, as well as  substantial arthropathy of the Lisfranc joint with erosions and subcortical marrow edema involving the bases of the second, third, and fourth metatarsals as well as the distal middle and lateral cuneiforms.  PMHx: ESRD, HD, anemia, DM, seizures, XXX syndrome   Clinical Impression   [name] presents to OT with slight unsteadiness in her gait 2/2 L foot wound and offloading shoe however is at baseline modified independent level. No current OT needs needed. Therapeutic use of self and emotional support provided as pt asked OT several questions re amputation sx. Provided education within scope and encouragement re high level of function that can be achieved after any level of amputation. Pt completed 500+ ft of functional mobility at mod I level as well as ADLs.   Thank you for the consult. Please re-consult if pt agrees to surgery, otherwise OT signing off at this time.      Recommendations for follow up therapy are one component of a multi-disciplinary discharge planning process, led by the attending physician.  Recommendations may be updated based on patient status, additional functional criteria and insurance authorization.   Follow Up Recommendations  No OT follow up     Assistance Recommended at Discharge PRN  Patient can return home with the following      Functional Status Assessment  Patient has not had a recent decline in their functional status  Equipment Recommendations  None recommended by OT    Recommendations for Other Services       Precautions / Restrictions Precautions Precautions: Other (comment) Precaution Comments: offloading shoe on the L Restrictions Weight  Bearing Restrictions: No      Mobility Bed Mobility Overal bed mobility: Modified Independent                  Transfers Overall transfer level: Modified independent Equipment used: None                      Balance Overall balance assessment: Modified Independent                                         ADL either performed or assessed with clinical judgement   ADL Overall ADL's : Modified independent                                             Vision Baseline Vision/History: 0 No visual deficits Ability to See in Adequate Light: 0 Adequate Patient Visual Report: No change from baseline Vision Assessment?: No apparent visual deficits     Perception     Praxis      Pertinent Vitals/Pain Pain Assessment Pain Assessment: No/denies pain     Hand Dominance Right   Extremity/Trunk Assessment Upper Extremity Assessment Upper Extremity Assessment: Overall WFL for tasks assessed   Lower Extremity Assessment LLE Deficits / Details: poor foot sensation on L foot LLE Coordination: decreased gross motor   Cervical / Trunk Assessment Cervical / Trunk Assessment: Normal   Communication  Communication Communication: No difficulties   Cognition Arousal/Alertness: Awake/alert Behavior During Therapy: WFL for tasks assessed/performed Overall Cognitive Status: Within Functional Limits for tasks assessed                                       General Comments       Exercises     Shoulder Instructions      Home Living Family/patient expects to be discharged to:: Private residence Living Arrangements: Spouse/significant other;Children (dad she takes care of) Available Help at Discharge: Family;Available PRN/intermittently Type of Home: House Home Access: Stairs to enter [employer] of Steps: 4 Entrance Stairs-Rails: None Home Layout: One level     Bathroom Shower/Tub: Animal nutritionist: Standard     Home Equipment: None          Prior Functioning/Environment Prior Level of Function : Independent/Modified Independent                        OT Problem List: Impaired balance (sitting and/or standing)      OT Treatment/Interventions:      OT Goals(Current goals can be found in the care plan section) Acute Rehab OT Goals Patient Stated Goal: get back home to dad before they send him to a nursing home OT Goal Formulation: With patient Time For Goal Achievement: 04/01/22 Potential to Achieve Goals: Good  OT Frequency:      Co-evaluation              AM-PAC OT "6 Clicks" Daily Activity     Outcome Measure Help from another person eating meals?: None Help from another person taking care of personal grooming?: None Help from another person toileting, which includes using toliet, bedpan, or urinal?: None Help from another person bathing (including washing, rinsing, drying)?: None Help from another person to put on and taking off regular upper body clothing?: None Help from another person to put on and taking off regular lower body clothing?: None 6 Click Score: 24   End of Session Equipment Utilized During Treatment: Gait belt Nurse Communication: Mobility status  Activity Tolerance: Patient tolerated treatment well Patient left: in bed;with call bell/phone within reach  OT Visit Diagnosis: Unsteadiness on feet (R26.81)                Time: 8315-1761 OT Time Calculation (min): 31 min Charges:  OT General Charges $OT Visit: 1 Visit OT Evaluation $OT Eval Low Complexity: 1 Low OT Treatments $Therapeutic Activity: 8-22 mins  Laverle Hobby, OTR/L, [place] [facility] Office: [place] 04/01/2022, 1:57 PM

## 2022-04-01 NOTE — Progress Notes (Signed)
PT Cancellation Note  Patient Details Name: [name] MRN: [id number] DOB: 1985/08/26   Cancelled Treatment:    Reason Eval/Treat Not Completed: (P) Patient at procedure or test/unavailable (pt off unit at HD dept.) Will continue efforts per PT plan of care as schedule permits.   Elantra Caprara M Crystel Demarco 04/01/2022, 10:15 AM

## 2022-04-01 NOTE — Progress Notes (Signed)
Patient arrived back to unit.  Patient is sitting up in bed on phone.  No noted distress or complaints.

## 2022-04-02 ENCOUNTER — Encounter (HOSPITAL_COMMUNITY)
Admission: EM | Disposition: A | Discharge status: Home Health | Payer: Self-pay | Source: Ambulatory Visit | Attending: Internal Medicine

## 2022-04-02 ENCOUNTER — Inpatient Hospital Stay (HOSPITAL_COMMUNITY): Payer: 59 | Admitting: Certified Registered"

## 2022-04-02 ENCOUNTER — Other Ambulatory Visit: Payer: Self-pay

## 2022-04-02 ENCOUNTER — Encounter (HOSPITAL_COMMUNITY): Payer: Self-pay | Admitting: Internal Medicine

## 2022-04-02 DIAGNOSIS — L97529 Non-pressure chronic ulcer of other part of left foot with unspecified severity: Secondary | ICD-10-CM | POA: Diagnosis not present

## 2022-04-02 DIAGNOSIS — M869 Osteomyelitis, unspecified: Secondary | ICD-10-CM

## 2022-04-02 DIAGNOSIS — I120 Hypertensive chronic kidney disease with stage 5 chronic kidney disease or end stage renal disease: Secondary | ICD-10-CM

## 2022-04-02 DIAGNOSIS — E10621 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: Secondary | ICD-10-CM | POA: Diagnosis not present

## 2022-04-02 DIAGNOSIS — S91302A Unspecified open wound, left foot, initial encounter: Secondary | ICD-10-CM

## 2022-04-02 DIAGNOSIS — N186 End stage renal disease: Secondary | ICD-10-CM

## 2022-04-02 DIAGNOSIS — D631 Anemia in chronic kidney disease: Secondary | ICD-10-CM

## 2022-04-02 DIAGNOSIS — E1169 Type 2 diabetes mellitus with other specified complication: Secondary | ICD-10-CM

## 2022-04-02 DIAGNOSIS — Z794 Long term (current) use of insulin: Secondary | ICD-10-CM

## 2022-04-02 DIAGNOSIS — Z992 Dependence on renal dialysis: Secondary | ICD-10-CM

## 2022-04-02 DIAGNOSIS — E1122 Type 2 diabetes mellitus with diabetic chronic kidney disease: Secondary | ICD-10-CM

## 2022-04-02 HISTORY — PX: AMPUTATION: SHX166

## 2022-04-02 LAB — GLUCOSE, CAPILLARY
Glucose-Capillary: 167 mg/dL — ABNORMAL HIGH (ref 70–99)
Glucose-Capillary: 184 mg/dL — ABNORMAL HIGH (ref 70–99)
Glucose-Capillary: 197 mg/dL — ABNORMAL HIGH (ref 70–99)
Glucose-Capillary: 105 mg/dL — ABNORMAL HIGH (ref 70–99)
Glucose-Capillary: 97 mg/dL (ref 70–99)
Glucose-Capillary: 94 mg/dL (ref 70–99)
Glucose-Capillary: 247 mg/dL — ABNORMAL HIGH (ref 70–99)
Glucose-Capillary: 307 mg/dL — ABNORMAL HIGH (ref 70–99)
Glucose-Capillary: 306 mg/dL — ABNORMAL HIGH (ref 70–99)

## 2022-04-02 LAB — BASIC METABOLIC PANEL
Anion gap: 11 (ref 5–15)
BUN: 25 mg/dL — ABNORMAL HIGH (ref 6–20)
CO2: 29 mmol/L (ref 22–32)
Calcium: 7.8 mg/dL — ABNORMAL LOW (ref 8.9–10.3)
Chloride: 97 mmol/L — ABNORMAL LOW (ref 98–111)
Creatinine, Ser: 7.01 mg/dL — ABNORMAL HIGH (ref 0.44–1.00)
GFR, Estimated: 7 mL/min — ABNORMAL LOW (ref >60)
Glucose, Bld: 176 mg/dL — ABNORMAL HIGH (ref 70–99)
Potassium: 4.3 mmol/L (ref 3.5–5.1)
Sodium: 137 mmol/L (ref 135–145)

## 2022-04-02 LAB — CBC WITH DIFFERENTIAL/PLATELET
Abs Immature Granulocytes: 0.02 10*3/uL (ref 0.00–0.07)
Basophils Absolute: 0 10*3/uL (ref 0.0–0.1)
Basophils Relative: 1 %
Eosinophils Absolute: 0.4 10*3/uL (ref 0.0–0.5)
Eosinophils Relative: 8 %
HCT: 27.5 % — ABNORMAL LOW (ref 36.0–46.0)
Hemoglobin: 8.7 g/dL — ABNORMAL LOW (ref 12.0–15.0)
Immature Granulocytes: 0 %
Lymphocytes Relative: 28 %
Lymphs Abs: 1.4 10*3/uL (ref 0.7–4.0)
MCH: 30.6 pg (ref 26.0–34.0)
MCHC: 31.6 g/dL (ref 30.0–36.0)
MCV: 96.8 fL (ref 80.0–100.0)
Monocytes Absolute: 0.5 10*3/uL (ref 0.1–1.0)
Monocytes Relative: 9 %
Neutro Abs: 2.7 10*3/uL (ref 1.7–7.7)
Neutrophils Relative %: 54 %
Platelets: 176 10*3/uL (ref 150–400)
RBC: 2.84 MIL/uL — ABNORMAL LOW (ref 3.87–5.11)
RDW: 16.1 % — ABNORMAL HIGH (ref 11.5–15.5)
WBC: 5.1 10*3/uL (ref 4.0–10.5)
nRBC: 0 % (ref 0.0–0.2)

## 2022-04-02 LAB — POCT I-STAT, CHEM 8
BUN: 32 mg/dL — ABNORMAL HIGH (ref 6–20)
Calcium, Ion: 0.99 mmol/L — ABNORMAL LOW (ref 1.15–1.40)
Chloride: 100 mmol/L (ref 98–111)
Creatinine, Ser: 8.3 mg/dL — ABNORMAL HIGH (ref 0.44–1.00)
Glucose, Bld: 107 mg/dL — ABNORMAL HIGH (ref 70–99)
HCT: 36 % (ref 36.0–46.0)
Hemoglobin: 12.2 g/dL (ref 12.0–15.0)
Potassium: 4.6 mmol/L (ref 3.5–5.1)
Sodium: 140 mmol/L (ref 135–145)
TCO2: 27 mmol/L (ref 22–32)

## 2022-04-02 LAB — BRAIN NATRIURETIC PEPTIDE: B Natriuretic Peptide: 252.6 pg/mL — ABNORMAL HIGH (ref 0.0–100.0)

## 2022-04-02 SURGERY — AMPUTATION, FOOT, RAY
Anesthesia: General | Laterality: Left

## 2022-04-02 MED ORDER — ALUM & MAG HYDROXIDE-SIMETH 200-200-20 MG/5ML PO SUSP: 15.0000 mL | ORAL | Status: DC | PRN

## 2022-04-02 MED ORDER — SODIUM CHLORIDE 0.9 % IV SOLN: INTRAVENOUS | Status: DC

## 2022-04-02 MED ORDER — CEFAZOLIN SODIUM-DEXTROSE 2-4 GM/100ML-% IV SOLN
2.0000 g | INTRAVENOUS | Status: AC
  Administered 2022-04-02: 2 g via INTRAVENOUS

## 2022-04-02 MED ORDER — DEXTROSE 50 % IV SOLN
INTRAVENOUS | Status: AC
  Administered 2022-04-02: 12.5 mL via INTRAVENOUS
  Filled 2022-04-02: qty 50

## 2022-04-02 MED ORDER — OXYCODONE HCL 5 MG/5ML PO SOLN: 5.0000 mg | Freq: Once | ORAL | Status: DC | PRN

## 2022-04-02 MED ORDER — MAGNESIUM SULFATE 2 GM/50ML IV SOLN: 2.0000 g | Freq: Every day | INTRAVENOUS | Status: DC | PRN

## 2022-04-02 MED ORDER — PROPOFOL 10 MG/ML IV BOLUS
INTRAVENOUS | Status: DC | PRN
  Administered 2022-04-02: 200 mg via INTRAVENOUS

## 2022-04-02 MED ORDER — ZINC SULFATE 220 (50 ZN) MG PO CAPS
220.0000 mg | ORAL_CAPSULE | Freq: Every day | ORAL | Status: DC
  Administered 2022-04-02 – 2022-04-04 (×3): 220 mg via ORAL
  Filled 2022-04-02 (×3): qty 1

## 2022-04-02 MED ORDER — MIDAZOLAM HCL 2 MG/2ML IJ SOLN: 2.0000 mg | Freq: Once | INTRAMUSCULAR | Status: AC

## 2022-04-02 MED ORDER — POVIDONE-IODINE 10 % EX SWAB: 2.0000 | Freq: Once | CUTANEOUS | Status: DC

## 2022-04-02 MED ORDER — OXYCODONE HCL 5 MG PO TABS: 5.0000 mg | ORAL_TABLET | Freq: Once | ORAL | Status: DC | PRN

## 2022-04-02 MED ORDER — LABETALOL HCL 5 MG/ML IV SOLN: 10.0000 mg | INTRAVENOUS | Status: DC | PRN

## 2022-04-02 MED ORDER — 0.9 % SODIUM CHLORIDE (POUR BTL) OPTIME
TOPICAL | Status: DC | PRN
  Administered 2022-04-02: 1000 mL

## 2022-04-02 MED ORDER — MAGNESIUM CITRATE PO SOLN: 1.0000 | Freq: Once | ORAL | Status: DC | PRN

## 2022-04-02 MED ORDER — CHLORHEXIDINE GLUCONATE 0.12 % MT SOLN: 15.0000 mL | Freq: Once | OROMUCOSAL | Status: AC

## 2022-04-02 MED ORDER — GUAIFENESIN-DM 100-10 MG/5ML PO SYRP: 15.0000 mL | ORAL_SOLUTION | ORAL | Status: DC | PRN

## 2022-04-02 MED ORDER — PANTOPRAZOLE SODIUM 40 MG PO TBEC: 40.0000 mg | DELAYED_RELEASE_TABLET | Freq: Every day | ORAL | Status: DC

## 2022-04-02 MED ORDER — POTASSIUM CHLORIDE CRYS ER 20 MEQ PO TBCR: 20.0000 meq | EXTENDED_RELEASE_TABLET | Freq: Every day | ORAL | Status: DC | PRN

## 2022-04-02 MED ORDER — ONDANSETRON HCL 4 MG/2ML IJ SOLN
INTRAMUSCULAR | Status: DC | PRN
  Administered 2022-04-02: 4 mg via INTRAVENOUS

## 2022-04-02 MED ORDER — CHLORHEXIDINE GLUCONATE 0.12 % MT SOLN
OROMUCOSAL | Status: AC
  Administered 2022-04-02: 15 mL via OROMUCOSAL
  Filled 2022-04-02: qty 15

## 2022-04-02 MED ORDER — MIDAZOLAM HCL 2 MG/2ML IJ SOLN
INTRAMUSCULAR | Status: AC
  Administered 2022-04-02: 2 mg via INTRAVENOUS
  Filled 2022-04-02: qty 2

## 2022-04-02 MED ORDER — VITAMIN C 500 MG PO TABS
1000.0000 mg | ORAL_TABLET | Freq: Every day | ORAL | Status: DC
  Administered 2022-04-02 – 2022-04-04 (×3): 1000 mg via ORAL
  Filled 2022-04-02 (×3): qty 2

## 2022-04-02 MED ORDER — BISACODYL 5 MG PO TBEC: 5.0000 mg | DELAYED_RELEASE_TABLET | Freq: Every day | ORAL | Status: DC | PRN

## 2022-04-02 MED ORDER — OXYCODONE HCL 5 MG PO TABS: 5.0000 mg | ORAL_TABLET | ORAL | Status: DC | PRN

## 2022-04-02 MED ORDER — BUPIVACAINE HCL (PF) 0.5 % IJ SOLN
INTRAMUSCULAR | Status: DC | PRN
  Administered 2022-04-02: 25 mL via PERINEURAL
  Administered 2022-04-02: 20 mL via PERINEURAL

## 2022-04-02 MED ORDER — SODIUM CHLORIDE 0.9 % IV SOLN: INTRAVENOUS | Status: DC | PRN

## 2022-04-02 MED ORDER — FENTANYL CITRATE (PF) 100 MCG/2ML IJ SOLN: 25.0000 ug | INTRAMUSCULAR | Status: DC | PRN

## 2022-04-02 MED ORDER — DEXAMETHASONE SODIUM PHOSPHATE 10 MG/ML IJ SOLN
INTRAMUSCULAR | Status: DC | PRN
  Administered 2022-04-02: 5 mg via INTRAVENOUS

## 2022-04-02 MED ORDER — ORAL CARE MOUTH RINSE: 15.0000 mL | Freq: Once | OROMUCOSAL | Status: AC

## 2022-04-02 MED ORDER — CEFAZOLIN SODIUM-DEXTROSE 2-4 GM/100ML-% IV SOLN: 2.0000 g | Freq: Three times a day (TID) | INTRAVENOUS | Status: DC

## 2022-04-02 MED ORDER — ONDANSETRON HCL 4 MG/2ML IJ SOLN: 4.0000 mg | Freq: Once | INTRAMUSCULAR | Status: DC | PRN

## 2022-04-02 MED ORDER — PIPERACILLIN-TAZOBACTAM 3.375 G IVPB
INTRAVENOUS | Status: AC
  Filled 2022-04-02: qty 50

## 2022-04-02 MED ORDER — FENTANYL CITRATE (PF) 100 MCG/2ML IJ SOLN: 50.0000 ug | Freq: Once | INTRAMUSCULAR | Status: AC

## 2022-04-02 MED ORDER — LIDOCAINE HCL (CARDIAC) PF 100 MG/5ML IV SOSY
PREFILLED_SYRINGE | INTRAVENOUS | Status: DC | PRN
  Administered 2022-04-02: 80 mg via INTRATRACHEAL

## 2022-04-02 MED ORDER — DARBEPOETIN ALFA 60 MCG/0.3ML IJ SOSY
60.0000 ug | PREFILLED_SYRINGE | INTRAMUSCULAR | Status: DC
  Administered 2022-04-02: 60 ug via SUBCUTANEOUS
  Filled 2022-04-02: qty 0.3

## 2022-04-02 MED ORDER — HYDROMORPHONE HCL 1 MG/ML IJ SOLN
0.5000 mg | INTRAMUSCULAR | Status: DC | PRN
  Administered 2022-04-03: 1 mg via INTRAVENOUS
  Filled 2022-04-02 (×4): qty 1

## 2022-04-02 MED ORDER — DOCUSATE SODIUM 100 MG PO CAPS
100.0000 mg | ORAL_CAPSULE | Freq: Every day | ORAL | Status: DC
  Administered 2022-04-04: 100 mg via ORAL
  Filled 2022-04-02: qty 1

## 2022-04-02 MED ORDER — ONDANSETRON HCL 4 MG/2ML IJ SOLN: 4.0000 mg | Freq: Four times a day (QID) | INTRAMUSCULAR | Status: DC | PRN

## 2022-04-02 MED ORDER — CEFAZOLIN SODIUM-DEXTROSE 2-4 GM/100ML-% IV SOLN
INTRAVENOUS | Status: AC
  Filled 2022-04-02: qty 100

## 2022-04-02 MED ORDER — PHENOL 1.4 % MT LIQD: 1.0000 | OROMUCOSAL | Status: DC | PRN

## 2022-04-02 MED ORDER — DEXTROSE 50 % IV SOLN: 12.5000 mL | Freq: Once | INTRAVENOUS | Status: AC

## 2022-04-02 MED ORDER — JUVEN PO PACK
1.0000 | PACK | Freq: Two times a day (BID) | ORAL | Status: DC
  Administered 2022-04-03 – 2022-04-04 (×2): 1 via ORAL
  Filled 2022-04-02 (×2): qty 1

## 2022-04-02 MED ORDER — POLYETHYLENE GLYCOL 3350 17 G PO PACK: 17.0000 g | PACK | Freq: Every day | ORAL | Status: DC | PRN

## 2022-04-02 MED ORDER — ACETAMINOPHEN 325 MG PO TABS: 325.0000 mg | ORAL_TABLET | Freq: Four times a day (QID) | ORAL | Status: DC | PRN

## 2022-04-02 MED ORDER — PHENYLEPHRINE 80 MCG/ML (10ML) SYRINGE FOR IV PUSH (FOR BLOOD PRESSURE SUPPORT)
PREFILLED_SYRINGE | INTRAVENOUS | Status: DC | PRN
  Administered 2022-04-02 (×5): 80 ug via INTRAVENOUS

## 2022-04-02 MED ORDER — HYDRALAZINE HCL 20 MG/ML IJ SOLN: 5.0000 mg | INTRAMUSCULAR | Status: DC | PRN

## 2022-04-02 MED ORDER — OXYCODONE HCL 5 MG PO TABS
10.0000 mg | ORAL_TABLET | ORAL | Status: DC | PRN
  Administered 2022-04-02 – 2022-04-03 (×2): 15 mg via ORAL
  Filled 2022-04-02 (×2): qty 3

## 2022-04-02 MED ORDER — METOPROLOL TARTRATE 5 MG/5ML IV SOLN: 2.0000 mg | INTRAVENOUS | Status: DC | PRN

## 2022-04-02 MED ORDER — FENTANYL CITRATE (PF) 100 MCG/2ML IJ SOLN
INTRAMUSCULAR | Status: AC
  Administered 2022-04-02: 50 ug via INTRAVENOUS
  Filled 2022-04-02: qty 2

## 2022-04-02 MED ORDER — CHLORHEXIDINE GLUCONATE 4 % EX LIQD: 60.0000 mL | Freq: Once | CUTANEOUS | Status: DC

## 2022-04-02 SURGICAL SUPPLY — 29 items
BAG COUNTER SPONGE SURGICOUNT (BAG) ×1 IMPLANT
BLADE SAW SGTL MED 73X18.5 STR (BLADE) IMPLANT
BLADE SURG 21 STRL SS (BLADE) ×1 IMPLANT
BNDG COHESIVE 4X5 TAN STRL (GAUZE/BANDAGES/DRESSINGS) ×1 IMPLANT
BNDG COHESIVE 4X5 TAN STRL LF (GAUZE/BANDAGES/DRESSINGS) IMPLANT
BNDG GAUZE DERMACEA FLUFF 4 (GAUZE/BANDAGES/DRESSINGS) ×1 IMPLANT
COVER SURGICAL LIGHT HANDLE (MISCELLANEOUS) ×2 IMPLANT
DRAPE U-SHAPE 47X51 STRL (DRAPES) ×2 IMPLANT
DRSG ADAPTIC 3X8 NADH LF (GAUZE/BANDAGES/DRESSINGS) ×1 IMPLANT
DURAPREP 26ML APPLICATOR (WOUND CARE) ×1 IMPLANT
ELECT REM PT RETURN 9FT ADLT (ELECTROSURGICAL) ×1
ELECTRODE REM PT RTRN 9FT ADLT (ELECTROSURGICAL) ×1 IMPLANT
GAUZE PAD ABD 8X10 STRL (GAUZE/BANDAGES/DRESSINGS) ×2 IMPLANT
GAUZE SPONGE 4X4 12PLY STRL (GAUZE/BANDAGES/DRESSINGS) ×1 IMPLANT
GLOVE BIOGEL PI IND STRL 9 (GLOVE) ×1 IMPLANT
GLOVE SURG ORTHO 9.0 STRL STRW (GLOVE) ×1 IMPLANT
GOWN STRL REUS W/ TWL XL LVL3 (GOWN DISPOSABLE) ×2 IMPLANT
GOWN STRL REUS W/TWL XL LVL3 (GOWN DISPOSABLE) ×2
KIT BASIN OR (CUSTOM PROCEDURE TRAY) ×1 IMPLANT
KIT TURNOVER KIT B (KITS) ×1 IMPLANT
NS IRRIG 1000ML POUR BTL (IV SOLUTION) ×1 IMPLANT
PACK ORTHO EXTREMITY (CUSTOM PROCEDURE TRAY) ×1 IMPLANT
PAD ABD 8X10 STRL (GAUZE/BANDAGES/DRESSINGS) IMPLANT
PAD ARMBOARD 7.5X6 YLW CONV (MISCELLANEOUS) ×2 IMPLANT
STOCKINETTE IMPERVIOUS LG (DRAPES) IMPLANT
SUT ETHILON 2 0 PSLX (SUTURE) ×1 IMPLANT
TOWEL GREEN STERILE (TOWEL DISPOSABLE) ×1 IMPLANT
TUBE CONNECTING 12X1/4 (SUCTIONS) ×1 IMPLANT
YANKAUER SUCT BULB TIP NO VENT (SUCTIONS) ×1 IMPLANT

## 2022-04-02 NOTE — Transfer of Care (Signed)
Immediate Anesthesia Transfer of Care Note  Patient: [name]  Procedure(s) Performed: AMPUTATION LEFT GREAT TOE (Left)  Patient Location: PACU  Anesthesia Type:General  Level of Consciousness: awake and alert   Airway & Oxygen Therapy: Patient Spontanous Breathing and Patient connected to nasal cannula oxygen  Post-op Assessment: Report given to RN and Post -op Vital signs reviewed and stable  Post vital signs: Reviewed and stable  Last Vitals:  Vitals Value Taken Time  BP 90/69 04/02/22 1502  Temp    Pulse 90 04/02/22 1505  Resp 23 04/02/22 1505  SpO2 99 % 04/02/22 1505  Vitals shown include unvalidated device data.  Last Pain:  Vitals:   04/02/22 1320  TempSrc:   PainSc: 8       Patients Stated Pain Goal: 1 (55/20/80 2233)  Complications: No notable events documented.

## 2022-04-02 NOTE — Anesthesia Postprocedure Evaluation (Signed)
Anesthesia Post Note  Patient: [name]  Procedure(s) Performed: AMPUTATION LEFT GREAT TOE (Left)     Patient location during evaluation: PACU Anesthesia Type: General Level of consciousness: awake and alert and oriented Pain management: pain level controlled Vital Signs Assessment: post-procedure vital signs reviewed and stable Respiratory status: spontaneous breathing, nonlabored ventilation and respiratory function stable Cardiovascular status: blood pressure returned to baseline and stable Postop Assessment: no apparent nausea or vomiting Anesthetic complications: no   No notable events documented.  Last Vitals:  Vitals:   04/02/22 1515 04/02/22 1530  BP: 95/69 94/69  Pulse: 93 89  Resp: 18 15  Temp:  36.7 C  SpO2: 100% 100%    Last Pain:  Vitals:   04/02/22 1530  TempSrc:   PainSc: 0-No pain                 Samarth Ogle A.

## 2022-04-02 NOTE — Progress Notes (Signed)
CBG 105, pt states this is low for her and she feels shaky and asked for sugar water. Pt states her normal is 120-140. Dr. Royce Macadamia made aware, orders for 12.57mL of dextrose.

## 2022-04-02 NOTE — Anesthesia Procedure Notes (Signed)
Anesthesia Regional Block: Adductor canal block   Pre-Anesthetic Checklist: , timeout performed,  Correct Patient, Correct Site, Correct Laterality,  Correct Procedure, Correct Position, site marked,  Risks and benefits discussed,  Surgical consent,  Pre-op evaluation,  At surgeon's request and post-op pain management  Laterality: Left  Prep: chloraprep       Needles:  Injection technique: Single-shot  Needle Type: Echogenic Stimulator Needle     Needle Length: 10cm  Needle Gauge: 21   Needle insertion depth: 6 cm   Additional Needles:   Procedures:,,,, ultrasound used (permanent image in chart),,    Narrative:  Start time: 04/02/2022 2:02 PM End time: 04/02/2022 2:07 PM Injection made incrementally with aspirations every 5 mL.  Performed by: Personally  Anesthesiologist: Josephine Igo, MD  Additional Notes: Timeout performed. Patient sedated. Relevant anatomy ID'd using [place]. Incremental 2-22ml injection of LA with frequent aspiration. Patient tolerated procedure well.

## 2022-04-02 NOTE — Progress Notes (Signed)
PROGRESS NOTE                                                                                                                                                                                                             Patient Demographics:    [name], is a 37 y.o. female, DOB - Aug 01, 1985, ZCH:[id number]  Outpatient Primary MD for the patient is [name], MD    LOS - 4  Admit date - 03/29/2022    Chief Complaint  Patient presents with   Wound Check       Brief Narrative (HPI from H&P)   -  37 y.o. female with medical history significant for type 1 diabetes, ESRD Tuesday Thursday Saturday,  seizures/pseudoseizures.  Patient presented to the ED with complaints of pain and swelling to her left lower foot , symptoms started with some pain to her left leg on Tuesday a week ago, 1/9.  2 days later she reports a blister on her left big toe suddenly opened up with drainage of foul-smelling discharge, she was admitted for left foot cellulitis with possible underlying osteomyelitis to [facility] and subsequently transferred to [facility] for further care.   Subjective:   Patient in bed, appears comfortable, denies any headache, no fever, no chest pain or pressure, no shortness of breath , no abdominal pain. No new focal weakness, wanted a second opinion for her foot, does not want to comply with diet wants regular diet.   Assessment  & Plan :   L. Diabetic foot ulcer ([place])  - Ulcer to big left toe with surrounding cellulitis, foul smell, with swelling, extending towards ankle, has been placed on empiric IV vancomycin and Zosyn, cultures being followed, will trend ESR and CRP, podiatry on board.  MRI noted with suspicion for osteomyelitis of the first greater toe distal phalanx along with some nonspecific involvement of multiple metatarsal bones, continue supportive care with pain control, she was seen by to podiatrist  who recommended left greater toe amputation but she refused and wanted second opinion, seen by Dr. Sharol Given on 04/02/2022 with same recommendations.  Now agreeable for surgery on 04/02/2022.  Continue to monitor antibiotics.  Will follow intraoperative cultures.  ESRD on hemodialysis ([place]) on TTS schedule.  Nephrology on board.  HX of seizures and pseudoseizures.  Monitor clinically.  Not on  any medications.   Anemia due to chronic kidney disease - Hemoglobin 10.8.  Stable.  Diabetes mellitus type 1 ([place]) -  Resume Tresiba/long-acting insulin at reduced dose 8u daily( Home dose ~10u)  Lab Results  Component Value Date   HGBA1C 9.1 (H) 03/30/2022   CBG (last 3)  Recent Labs    04/01/22 1955 04/01/22 2255 04/02/22 0342  GLUCAP 152* 108* 167*        Condition - Fair  Family Communication  :  None  Code Status :  Full  Consults  :  Renal, Podiatry, Ortho  PUD Prophylaxis : PPI   Procedures  :     MRI L Foot - Osteomyelitis of the distal phalanx great toe, with surrounding fluid collection draining through the plantar ulceration. 2. Substantial arthropathy of the Lisfranc joint with erosions and subcortical marrow edema particularly involving the bases of the second, third, and fourth metatarsals as well as the distal middle and lateral cuneiforms. The fourth metatarsal base is most involved, with substantial marrow edema tracking in the metaphysis and shaft, to such a degree that chronic osteomyelitis is not excluded (although arthropathy is favored given the involvement of the other regional osseous structures). 3. Substantial thickening and heterogeneous but primarily low signal in the plantar subcutaneous tissues of the foot extending along a 7.1 by 1.3 by 10.9 cm length, with loss of the normal fatty signal, possibly from chronic inflammation or fibrosis. 4. Hallux valgus. 5. Subcutaneous edema along dorsum of the foot, cellulitis not excluded.      Disposition Plan  :    Status  is: Inpatient  DVT Prophylaxis  :    heparin injection 5,000 Units Start: 03/30/22 1400  Lab Results  Component Value Date   PLT 176 04/02/2022    Diet :  Diet Order             Diet NPO time specified Except for: Sips with Meds  Diet effective midnight                    Inpatient Medications  Scheduled Meds:  calcium acetate  1,334 mg Oral TID WC   calcium carbonate (dosed in mg elemental calcium)  500 mg of elemental calcium Oral Daily   doxercalciferol  2 mcg Intravenous Q T,Th,Sa-HD   famotidine  10 mg Oral Daily   gabapentin  600 mg Oral TID   heparin  5,000 Units Subcutaneous Q8H   insulin aspart  0-9 Units Subcutaneous TID WC   insulin glargine-yfgn  4 Units Subcutaneous Daily   pantoprazole  40 mg Oral Daily   sevelamer carbonate  2.4 g Oral TID WC   simvastatin  20 mg Oral QHS   Continuous Infusions:  piperacillin-tazobactam (ZOSYN)  IV 2.25 g (04/02/22 0544)   vancomycin 750 mg (04/01/22 1624)   PRN Meds:.acetaminophen **OR** acetaminophen, fentaNYL (SUBLIMAZE) injection, HYDROcodone-acetaminophen, hydrOXYzine, metoprolol tartrate, polyethylene glycol, tiZANidine    Objective:   Vitals:   04/01/22 1259 04/01/22 1606 04/01/22 1958 04/02/22 0348  BP: (!) 120/103 126/87 (!) 129/96 121/87  Pulse: 95 94 87 72  Resp: 16 18 18 18   Temp: 98.5 F (36.9 C) 98.5 F (36.9 C) 98.2 F (36.8 C) 98 F (36.7 C)  TempSrc: Oral Oral Oral Oral  SpO2: 98% 99% 95% 99%  Weight:      Height:        Wt Readings from Last 3 Encounters:  04/01/22 83 kg  12/31/21 79.7 kg  10/02/21 78  kg     Intake/Output Summary (Last 24 hours) at 04/02/2022 0816 Last data filed at 04/01/2022 1212 Gross per 24 hour  Intake --  Output 2 ml  Net -2 ml      Physical Exam  Awake Alert, No new F.N deficits, Normal affect [place].AT,PERRAL Supple Neck, No JVD,   Symmetrical Chest wall movement, Good air movement bilaterally, CTAB RRR,No Gallops,Rubs or new Murmurs,  +ve  B.Sounds, Abd Soft, No tenderness,   No Cyanosis, L foot as below       Data Review:    Recent Labs  Lab 03/29/22 1424 03/30/22 0627 03/31/22 0325 04/01/22 0453 04/02/22 0345  WBC 9.6 5.1 5.1 4.7 5.1  HGB 10.8* 9.1* 10.1* 9.3* 8.7*  HCT 35.0* 29.5* 33.5* 29.5* 27.5*  PLT 221 170 175 167 176  MCV 98.3 99.0 99.7 97.7 96.8  MCH 30.3 30.5 30.1 30.8 30.6  MCHC 30.9 30.8 30.1 31.5 31.6  RDW 16.6* 16.7* 16.4* 16.3* 16.1*  LYMPHSABS 1.1  --  1.1 1.4 1.4  MONOABS 0.3  --  0.4 0.4 0.5  EOSABS 0.3  --  0.3 0.4 0.4  BASOSABS 0.1  --  0.1 0.1 0.0    Recent Labs  Lab 03/29/22 1422 03/29/22 1424 03/30/22 0627 03/31/22 0325 03/31/22 0650 04/01/22 0453 04/02/22 0345  NA  --  131* 135 135  --  137 137  K  --  5.5* 4.9 5.0  --  4.7 4.3  CL  --  92* 98 98  --  97* 97*  CO2  --  25 23 23   --  25 29  ANIONGAP  --  14 14 14   --  15 11  GLUCOSE  --  242* 148* 140*  --  176* 176*  BUN  --  39* 47* 33*  --  45* 25*  CREATININE  --  9.44* 11.03* 8.76*  --  10.69* 7.01*  AST  --  15  --   --   --   --   --   ALT  --  11  --   --   --   --   --   ALKPHOS  --  59  --   --   --   --   --   BILITOT  --  0.6  --   --   --   --   --   ALBUMIN  --  3.9  --   --   --   --   --   CRP 19.2*  --   --   --  14.3*  --   --   LATICACIDVEN  --  1.0  --   --   --   --   --   INR  --  1.1  --   --  1.1  --   --   HGBA1C  --   --  9.1*  --   --   --   --   BNP  --   --   --   --   --  381.3* 252.6*  CALCIUM  --  8.4* 7.8* 8.1*  --  8.5* 7.8*    Radiology Reports US ARTERIAL ABI (SCREENING LOWER EXTREMITY)  Result Date: 03/30/2022 CLINICAL DATA:  Diabetic foot ulcer Hyperlipidemia EXAM: NONINVASIVE PHYSIOLOGIC VASCULAR STUDY OF BILATERAL LOWER EXTREMITIES TECHNIQUE: Evaluation of both lower extremities were performed at rest, including calculation of ankle-brachial indices with single level pressure measurements and doppler recording.  COMPARISON:  None available. FINDINGS: Right ABI:  1.31 Left ABI:   1.30 Right Lower Extremity: Posterior tibial artery waveform is monophasic. Dorsalis pedis artery waveform is multiphasic. Left Lower Extremity: Multiphasic waveforms are seen in the posterior tibial and dorsalis pedis arteries. 1.0-1.4 Normal IMPRESSION: No definitive evidence of significant lower extremity arterial occlusive disease. Electronically Signed   By: Miachel Roux M.D.   On: 03/30/2022 10:17   MR FOOT LEFT WO CONTRAST  Result Date: 03/29/2022 CLINICAL DATA:  Diabetic foot ulcer, purulent drainage. Sepsis. End-stage renal disease. Wound of the great toe. EXAM: MRI OF THE LEFT FOOT WITHOUT CONTRAST TECHNIQUE: Multiplanar, multisequence MR imaging of the left forefoot was performed. No intravenous contrast was administered. COMPARISON:  radiographs 03/29/2022 FINDINGS: Bones/Joint/Cartilage There is abnormal marrow edema throughout the distal phalanx great toe with surrounding fluid around the periosteum of the tuft of the great toe, compatible with active osteomyelitis and periostitis. Substantial arthropathy the Lisfranc joint with erosions and subcortical marrow edema particularly involving the bases of the second, third, and fourth metatarsals as well as the distal middle and lateral cuneiforms. The fourth metatarsal bases most involved, with substantial marrow edema tracking in the metaphysis and shaft, to such a degree that chronic osteomyelitis is not excluded Hallux valgus. Ligaments The Lisfranc ligament appears intact. Muscles and Tendons No intramuscular abscess or compelling evidence of myositis. Soft tissues Plantar ulceration of the great toe. There appears to be a fluid collection surrounding the tuft of the great toe draining through this plantar ulceration on image 12 series 7, further cementing the great toe involvement as being due to osteomyelitis. Subcutaneous edema along dorsum of the foot, cellulitis not excluded. Substantial thickening and heterogeneous but primarily low signal  in the plantar subcutaneous tissues of the foot extending along a 7.1 by 1.3 by over 10.9 cm length, with loss of the normal fatty signal, possibly from chronic inflammation or fibrosis. I favor the former. IMPRESSION: 1. Osteomyelitis of the distal phalanx great toe, with surrounding fluid collection draining through the plantar ulceration. 2. Substantial arthropathy of the Lisfranc joint with erosions and subcortical marrow edema particularly involving the bases of the second, third, and fourth metatarsals as well as the distal middle and lateral cuneiforms. The fourth metatarsal base is most involved, with substantial marrow edema tracking in the metaphysis and shaft, to such a degree that chronic osteomyelitis is not excluded (although arthropathy is favored given the involvement of the other regional osseous structures). 3. Substantial thickening and heterogeneous but primarily low signal in the plantar subcutaneous tissues of the foot extending along a 7.1 by 1.3 by 10.9 cm length, with loss of the normal fatty signal, possibly from chronic inflammation or fibrosis. 4. Hallux valgus. 5. Subcutaneous edema along dorsum of the foot, cellulitis not excluded. Electronically Signed   By: Van Clines M.D.   On: 03/29/2022 18:28   DG Foot Complete Left  Result Date: 03/29/2022 CLINICAL DATA:  Foot pain, wound/ulcer. EXAM: LEFT FOOT - COMPLETE 3+ VIEW COMPARISON:  02/26/2021 FINDINGS: Minimal degenerative change of the first MTP joint and midfoot. No focal bone destruction to suggest osteomyelitis. Mild soft tissue swelling over the mid to forefoot. No air within the soft tissues. Small inferior calcaneal spur. IMPRESSION: 1. No acute findings. No evidence of osteomyelitis. 2. Mild soft tissue swelling. Electronically Signed   By: Marin Olp M.D.   On: 03/29/2022 14:58   DG Chest Port 1 View  Result Date: 03/29/2022 CLINICAL DATA:  Foot pain/ulcer. EXAM:  PORTABLE CHEST 1 VIEW COMPARISON:  12/27/2021  FINDINGS: Lungs are hypoinflated and otherwise clear. Cardiomediastinal silhouette and remainder of the exam is unchanged. IMPRESSION: Hypoinflation without acute cardiopulmonary disease. Electronically Signed   By: Marin Olp M.D.   On: 03/29/2022 14:52      Signature  -   Lala Lund M.D on 04/02/2022 at 8:16 AM   -  To page go to [email or web address]

## 2022-04-02 NOTE — Progress Notes (Signed)
Pharmacy Antibiotic Note  [name] is a 37 y.o. female admitted on 03/29/2022 with cellulitis/left foot ulcer.  Pharmacy has been consulted for vancomycin and Zosyn dosing. She has ESRD on HD TTS - on schedule. Plan is for left great toe amputation today.   Plan: Continue vancomycin 750 mg IV every TThSat w/HD Continue Zosyn 2.25 g IV q8h Monitor renal function, clinical progress, cultures/sensitivities F/U LOT and de-escalate as able Vancomycin levels as clinically indicated   Height: 5\' 6"  (167.6 cm) Weight: 83 kg (182 lb 15.7 oz) IBW/kg (Calculated) : 59.3  Temp (24hrs), Avg:98.3 F (36.8 C), Min:97.9 F (36.6 C), Max:98.9 F (37.2 C)  Recent Labs  Lab 03/29/22 1424 03/30/22 0627 03/31/22 0325 04/01/22 0453 04/02/22 0345  WBC 9.6 5.1 5.1 4.7 5.1  CREATININE 9.44* 11.03* 8.76* 10.69* 7.01*  LATICACIDVEN 1.0  --   --   --   --      Estimated Creatinine Clearance: 11.8 mL/min (A) (by C-G formula based on SCr of 7.01 mg/dL (H)).    Allergies  Allergen Reactions   Diphenhydramine Anaphylaxis   Motrin [Ibuprofen] Shortness Of Breath and Itching   Contrast Media [Iodinated Contrast Media] Itching   Peanut-Containing Drug Products Itching    Able to tolerate when cooked in foods   Shellfish Allergy Hives   Banana Itching, Nausea And Vomiting and Other (See Comments)    Sick on the stomach   Chlorhexidine Itching   Ferrous Sulfate Itching   Iron Dextran Itching and Other (See Comments)    Vein irritation     Antimicrobials this admission: Vanco 1/15 >> Zosyn 1/15 >>  Microbiology results: 1/15 BCx: ngtd   Thank you for involving pharmacy in this patient's care.  Renold Genta, PharmD, BCPS Clinical Pharmacist Clinical phone for 04/02/2022 is [phone] 04/02/2022 11:13 AM

## 2022-04-02 NOTE — Anesthesia Procedure Notes (Signed)
Procedure Name: LMA Insertion Date/Time: 04/02/2022 2:38 PM  Performed by: Timoteo Expose, CRNAPre-anesthesia Checklist: Patient identified, Emergency Drugs available, Patient being monitored, Timeout performed and Suction available Patient Re-evaluated:Patient Re-evaluated prior to induction Preoxygenation: Pre-oxygenation with 100% oxygen Induction Type: IV induction LMA: LMA inserted LMA Size: 4.0

## 2022-04-02 NOTE — Op Note (Signed)
04/02/2022  3:03 PM  PATIENT:  [name]    PRE-OPERATIVE DIAGNOSIS:  Osteomyelitis Left Great Toe  POST-OPERATIVE DIAGNOSIS:  Same  PROCEDURE:  AMPUTATION LEFT GREAT TOE  SURGEON:  Newt Minion, MD  PHYSICIAN ASSISTANT:None ANESTHESIA:   General  PREOPERATIVE INDICATIONS:  [name] is a  37 y.o. female with a diagnosis of Osteomyelitis Left Great Toe who failed conservative measures and elected for surgical management.    The risks benefits and alternatives were discussed with the patient preoperatively including but not limited to the risks of infection, bleeding, nerve injury, cardiopulmonary complications, the need for revision surgery, among others, and the patient was willing to proceed.  OPERATIVE IMPLANTS: none  @ENCIMAGES @  OPERATIVE FINDINGS: margins clear  OPERATIVE PROCEDURE: Patient was brought the operating room and underwent a general anesthetic.  After adequate levels anesthesia were obtained patient's left lower extremity was prepped using DuraPrep draped into a sterile field a timeout was called.  A fishmouth incision was made just distal to the left great toe MTP joint.  The toe was amputated through the MTP joint.  Electrocautery was used hemostasis the wound was irrigated with normal saline the tissue margins were clear with healthy viable petechial bleeding.  The incision was closed using 2-0 nylon a sterile dressing was applied patient was extubated taken the PACU in stable condition.   DISCHARGE PLANNING:  Antibiotic duration:24 hours  Weightbearing: Touchdown weightbearing on the left with a postoperative shoe  Pain medication: Opioid pathway  Dressing care/ Wound VAC: Dry dressing left foot  Ambulatory devices: Walker  Discharge to: Anticipate discharge to home when patient is cleared by therapy.  Follow-up: In the office 1 week post operative.

## 2022-04-02 NOTE — Progress Notes (Signed)
PT Cancellation Note  Patient Details Name: [name] MRN: [id number] DOB: [birth year]   Cancelled Treatment:    Reason Eval/Treat Not Completed: Other (comment).  Gone to surgery with L great toe amp, will be a re-assessment with PT tomorrow using ortho shoe and TDWB per Dr. Sharol Given.   Ramond Dial 04/02/2022, 3:14 PM  Mee Hives, PT PhD Acute Rehab Dept. Number: [place] and [place]

## 2022-04-02 NOTE — Anesthesia Procedure Notes (Signed)
Anesthesia Regional Block: Popliteal block   Pre-Anesthetic Checklist: , timeout performed,  Correct Patient, Correct Site, Correct Laterality,  Correct Procedure, Correct Position, site marked,  Risks and benefits discussed,  Surgical consent,  Pre-op evaluation,  At surgeon's request and post-op pain management  Laterality: Left  Prep: chloraprep       Needles:  Injection technique: Single-shot  Needle Type: Echogenic Stimulator Needle     Needle Length: 10cm  Needle Gauge: 21   Needle insertion depth: 6 cm   Additional Needles:   Procedures:,,,, ultrasound used (permanent image in chart),,    Narrative:  Start time: 04/02/2022 1:57 PM End time: 04/02/2022 2:02 PM Injection made incrementally with aspirations every 5 mL.  Performed by: Personally  Anesthesiologist: Josephine Igo, MD  Additional Notes: Timeout performed. Patient sedated. Relevant anatomy ID'd using [place]. Incremental 2-79ml injection of LA with frequent aspiration. Patient tolerated procedure well.

## 2022-04-02 NOTE — Anesthesia Preprocedure Evaluation (Addendum)
Anesthesia Evaluation  Patient identified by MRN, date of birth, ID band Patient awake    Reviewed: Allergy & Precautions, NPO status , Patient's Chart, lab work & pertinent test results, reviewed documented beta blocker date and time   Airway Mallampati: II  TM Distance: >3 FB     Dental no notable dental hx. (+) Dental Advisory Given   Pulmonary neg pulmonary ROS   Pulmonary exam normal breath sounds clear to auscultation       Cardiovascular hypertension, Pt. on medications Normal cardiovascular exam Rhythm:Regular Rate:Normal     Neuro/Psych Seizures -, Well Controlled,  PSYCHIATRIC DISORDERS      Peripheral neuropathy XXX syndrome  Neuromuscular disease    GI/Hepatic hiatal hernia,GERD  Medicated and Controlled,,Hx/o esophageal stricture   Endo/Other  diabetes, Poorly Controlled, Type 2, Insulin Dependent  Obesity Hyperlipidemia  Renal/GU ESRF and DialysisRenal disease  negative genitourinary   Musculoskeletal Osteomyelitis left great toe   Abdominal   Peds  Hematology  (+) Blood dyscrasia, anemia   Anesthesia Other Findings   Reproductive/Obstetrics negative OB ROS                              Anesthesia Physical Anesthesia Plan  ASA: 4  Anesthesia Plan: General   Post-op Pain Management: Regional block* and Minimal or no pain anticipated   Induction: Intravenous  PONV Risk Score and Plan: 4 or greater and Treatment may vary due to age or medical condition and Ondansetron  Airway Management Planned: LMA  Additional Equipment: None  Intra-op Plan:   Post-operative Plan: Extubation in OR  Informed Consent: I have reviewed the patients History and Physical, chart, labs and discussed the procedure including the risks, benefits and alternatives for the proposed anesthesia with the patient or authorized representative who has indicated his/her understanding and acceptance.      Dental advisory given  Plan Discussed with: Anesthesiologist and CRNA  Anesthesia Plan Comments:          Anesthesia Quick Evaluation
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
## 2022-04-02 NOTE — Care Management Important Message (Signed)
Important Message  Patient Details  Name: [name] MRN: [id number] Date of Birth: 1986/01/24   Medicare Important Message Given:  Yes     Hannah Beat 04/02/2022, 2:36 PM

## 2022-04-02 NOTE — Progress Notes (Signed)
[facility] Progress Note   Subjective:   Patient seen and examined at bedside.  In good spirits.  Now has agreed to have toe amputated.  Plan for surgery later today. Denies CP, SOB, abdominal pain and n/v/d.   Objective Vitals:   04/01/22 1259 04/01/22 1606 04/01/22 1958 04/02/22 0348  BP: (!) 120/103 126/87 (!) 129/96 121/87  Pulse: 95 94 87 72  Resp: 16 18 18 18   Temp: 98.5 F (36.9 C) 98.5 F (36.9 C) 98.2 F (36.8 C) 98 F (36.7 C)  TempSrc: Oral Oral Oral Oral  SpO2: 98% 99% 95% 99%  Weight:      Height:       Physical Exam General:WDWN female in NAD Heart:RRR no mrg Lungs:CTAB, nml WOB on RA Abdomen:soft, NTND Extremities:no LE edema, L foot ulcers Dialysis Access: LU AVG +b/t   Filed Weights   03/30/22 2213 04/01/22 0816 04/01/22 1212  Weight: 85 kg 85.1 kg 83 kg    Intake/Output Summary (Last 24 hours) at 04/02/2022 0907 Last data filed at 04/01/2022 1212 Gross per 24 hour  Intake --  Output 2 ml  Net -2 ml    Additional Objective Labs: Basic Metabolic Panel: Recent Labs  Lab 03/31/22 0325 04/01/22 0453 04/02/22 0345  NA 135 137 137  K 5.0 4.7 4.3  CL 98 97* 97*  CO2 23 25 29   GLUCOSE 140* 176* 176*  BUN 33* 45* 25*  CREATININE 8.76* 10.69* 7.01*  CALCIUM 8.1* 8.5* 7.8*   Liver Function Tests: Recent Labs  Lab 03/29/22 1424  AST 15  ALT 11  ALKPHOS 59  BILITOT 0.6  PROT 9.4*  ALBUMIN 3.9   CBC: Recent Labs  Lab 03/29/22 1424 03/30/22 0627 03/31/22 0325 04/01/22 0453 04/02/22 0345  WBC 9.6 5.1 5.1 4.7 5.1  NEUTROABS 7.9*  --  3.1 2.4 2.7  HGB 10.8* 9.1* 10.1* 9.3* 8.7*  HCT 35.0* 29.5* 33.5* 29.5* 27.5*  MCV 98.3 99.0 99.7 97.7 96.8  PLT 221 170 175 167 176   Blood Culture    Component Value Date/Time   SDES BLOOD RIGHT HAND 03/29/2022 1424   SDES BLOOD RIGHT ARM 03/29/2022 1424   SPECREQUEST  03/29/2022 1424    BOTTLES DRAWN AEROBIC AND ANAEROBIC Blood Culture adequate volume   SPECREQUEST  03/29/2022  1424    BOTTLES DRAWN AEROBIC AND ANAEROBIC Blood Culture adequate volume   CULT  03/29/2022 1424    NO GROWTH 4 DAYS Performed at [facility], [address] [place], [place] [address]    CULT  03/29/2022 1424    NO GROWTH 4 DAYS Performed at [facility], [address] [place], [place] [address]    REPTSTATUS PENDING 03/29/2022 1424   REPTSTATUS PENDING 03/29/2022 1424   CBG: Recent Labs  Lab 04/01/22 1318 04/01/22 1604 04/01/22 1955 04/01/22 2255 04/02/22 0342  GLUCAP 124* 250* 152* 108* 167*   Medications:  piperacillin-tazobactam (ZOSYN)  IV 2.25 g (04/02/22 0544)   vancomycin 750 mg (04/01/22 1624)    calcium acetate  1,334 mg Oral TID WC   calcium carbonate (dosed in mg elemental calcium)  500 mg of elemental calcium Oral Daily   doxercalciferol  2 mcg Intravenous Q T,Th,Sa-HD   famotidine  10 mg Oral Daily   gabapentin  600 mg Oral TID   heparin  5,000 Units Subcutaneous Q8H   insulin aspart  0-9 Units Subcutaneous TID WC   insulin glargine-yfgn  4 Units Subcutaneous Daily   pantoprazole  40  mg Oral Daily   sevelamer carbonate  2.4 g Oral TID WC   simvastatin  20 mg Oral QHS    Dialysis Orders: AF-  TTS 4 hours  EDW 79. HD Bath 2K/3calc, Dialyzer 180, Heparin yes-  4000 then 2500 mid. Access AVG-  16 gauge - 300 BFR.  Last HD Sat-  left out 80.6-  likely needs EDW up-  last hgb 10.8, phos 6.2, pth 89  hect 2 q tx, mircera 3   Assessment/Plan: 1. Left foot ulcer - Had trauma to foot. MRI with suspicion for osteo; IV vanc/Zosyn. Per primary/podiatry. Plan for L hallux amputation today.  2. ESRD - HD TTS - HD 1/18 uses 16g needles.  Continue HD per regular schedule. 3. HTN/volume-  BP/volume stable. On prn metoprolol.  UF as tolerated.  4. Anemia-  Hb 8.7.  Start ESA.  5. MBD of CKD -  Calcium a little low, use increase Ca bath.  Check phos. Continue hectorol/Renvela binder 6. Nutrition - Renal diet with fluid restriction  7. Diabetes - on insulin.  8. Hx  seizures/pseudoseizures - per PMD.    Jen Mow, PA-C [facility] 04/02/2022,9:07 AM  LOS: 4 days

## 2022-04-03 ENCOUNTER — Encounter (HOSPITAL_COMMUNITY): Payer: Self-pay | Admitting: Orthopedic Surgery

## 2022-04-03 DIAGNOSIS — E10621 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: Secondary | ICD-10-CM | POA: Diagnosis not present

## 2022-04-03 DIAGNOSIS — L97529 Non-pressure chronic ulcer of other part of left foot with unspecified severity: Secondary | ICD-10-CM | POA: Diagnosis not present

## 2022-04-03 LAB — CBC
HCT: 31.3 % — ABNORMAL LOW (ref 36.0–46.0)
Hemoglobin: 9.4 g/dL — ABNORMAL LOW (ref 12.0–15.0)
MCH: 30.2 pg (ref 26.0–34.0)
MCHC: 30 g/dL (ref 30.0–36.0)
MCV: 100.6 fL — ABNORMAL HIGH (ref 80.0–100.0)
Platelets: 215 10*3/uL (ref 150–400)
RBC: 3.11 MIL/uL — ABNORMAL LOW (ref 3.87–5.11)
RDW: 16.1 % — ABNORMAL HIGH (ref 11.5–15.5)
WBC: 5.8 10*3/uL (ref 4.0–10.5)
nRBC: 0 % (ref 0.0–0.2)

## 2022-04-03 LAB — BASIC METABOLIC PANEL
Anion gap: 12 (ref 5–15)
BUN: 37 mg/dL — ABNORMAL HIGH (ref 6–20)
CO2: 23 mmol/L (ref 22–32)
Calcium: 7.6 mg/dL — ABNORMAL LOW (ref 8.9–10.3)
Chloride: 99 mmol/L (ref 98–111)
Creatinine, Ser: 9 mg/dL — ABNORMAL HIGH (ref 0.44–1.00)
GFR, Estimated: 5 mL/min — ABNORMAL LOW (ref >60)
Glucose, Bld: 340 mg/dL — ABNORMAL HIGH (ref 70–99)
Potassium: 7.2 mmol/L (ref 3.5–5.1)
Sodium: 134 mmol/L — ABNORMAL LOW (ref 135–145)

## 2022-04-03 LAB — CBC WITH DIFFERENTIAL/PLATELET
Abs Immature Granulocytes: 0.03 10*3/uL (ref 0.00–0.07)
Basophils Absolute: 0 10*3/uL (ref 0.0–0.1)
Basophils Relative: 1 %
Eosinophils Absolute: 0 10*3/uL (ref 0.0–0.5)
Eosinophils Relative: 0 %
HCT: 30.6 % — ABNORMAL LOW (ref 36.0–46.0)
Hemoglobin: 9.6 g/dL — ABNORMAL LOW (ref 12.0–15.0)
Immature Granulocytes: 1 %
Lymphocytes Relative: 13 %
Lymphs Abs: 0.7 10*3/uL (ref 0.7–4.0)
MCH: 30.8 pg (ref 26.0–34.0)
MCHC: 31.4 g/dL (ref 30.0–36.0)
MCV: 98.1 fL (ref 80.0–100.0)
Monocytes Absolute: 0.2 10*3/uL (ref 0.1–1.0)
Monocytes Relative: 5 %
Neutro Abs: 4.2 10*3/uL (ref 1.7–7.7)
Neutrophils Relative %: 80 %
Platelets: 188 10*3/uL (ref 150–400)
RBC: 3.12 MIL/uL — ABNORMAL LOW (ref 3.87–5.11)
RDW: 16 % — ABNORMAL HIGH (ref 11.5–15.5)
WBC: 5.3 10*3/uL (ref 4.0–10.5)
nRBC: 0 % (ref 0.0–0.2)

## 2022-04-03 LAB — CULTURE, BLOOD (ROUTINE X 2)
Culture: NO GROWTH
Culture: NO GROWTH
Special Requests: ADEQUATE
Special Requests: ADEQUATE

## 2022-04-03 LAB — RENAL FUNCTION PANEL
Albumin: 3 g/dL — ABNORMAL LOW (ref 3.5–5.0)
Anion gap: 14 (ref 5–15)
BUN: 42 mg/dL — ABNORMAL HIGH (ref 6–20)
CO2: 24 mmol/L (ref 22–32)
Calcium: 8.3 mg/dL — ABNORMAL LOW (ref 8.9–10.3)
Chloride: 100 mmol/L (ref 98–111)
Creatinine, Ser: 9.04 mg/dL — ABNORMAL HIGH (ref 0.44–1.00)
GFR, Estimated: 5 mL/min — ABNORMAL LOW (ref >60)
Glucose, Bld: 318 mg/dL — ABNORMAL HIGH (ref 70–99)
Phosphorus: 5.5 mg/dL — ABNORMAL HIGH (ref 2.5–4.6)
Potassium: 5 mmol/L (ref 3.5–5.1)
Sodium: 138 mmol/L (ref 135–145)

## 2022-04-03 LAB — GLUCOSE, CAPILLARY
Glucose-Capillary: 305 mg/dL — ABNORMAL HIGH (ref 70–99)
Glucose-Capillary: 167 mg/dL — ABNORMAL HIGH (ref 70–99)
Glucose-Capillary: 373 mg/dL — ABNORMAL HIGH (ref 70–99)
Glucose-Capillary: 387 mg/dL — ABNORMAL HIGH (ref 70–99)

## 2022-04-03 LAB — OCCULT BLOOD X 1 CARD TO LAB, STOOL: Fecal Occult Bld: NEGATIVE

## 2022-04-03 LAB — BRAIN NATRIURETIC PEPTIDE: B Natriuretic Peptide: 256.2 pg/mL — ABNORMAL HIGH (ref 0.0–100.0)

## 2022-04-03 MED ORDER — INSULIN ASPART 100 UNIT/ML IJ SOLN: 10.0000 [IU] | Freq: Once | INTRAMUSCULAR | Status: DC

## 2022-04-03 MED ORDER — SACCHAROMYCES BOULARDII 250 MG PO CAPS
250.0000 mg | ORAL_CAPSULE | Freq: Two times a day (BID) | ORAL | Status: DC
  Administered 2022-04-03 – 2022-04-04 (×3): 250 mg via ORAL
  Filled 2022-04-03 (×3): qty 1

## 2022-04-03 MED ORDER — INSULIN ASPART 100 UNIT/ML IJ SOLN
5.0000 [IU] | Freq: Once | INTRAMUSCULAR | Status: AC
  Administered 2022-04-03: 5 [IU] via INTRAVENOUS

## 2022-04-03 MED ORDER — PENTAFLUOROPROP-TETRAFLUOROETH EX AERO: 1.0000 | INHALATION_SPRAY | CUTANEOUS | Status: DC | PRN

## 2022-04-03 MED ORDER — ALTEPLASE 2 MG IJ SOLR: 2.0000 mg | Freq: Once | INTRAMUSCULAR | Status: DC | PRN

## 2022-04-03 MED ORDER — ANTICOAGULANT SODIUM CITRATE 4% (200MG/5ML) IV SOLN: 5.0000 mL | Status: DC | PRN

## 2022-04-03 MED ORDER — SODIUM POLYSTYRENE SULFONATE 15 GM/60ML PO SUSP
30.0000 g | Freq: Once | ORAL | Status: AC
  Administered 2022-04-03: 30 g via ORAL
  Filled 2022-04-03: qty 120

## 2022-04-03 MED ORDER — INSULIN GLARGINE-YFGN 100 UNIT/ML ~~LOC~~ SOLN
10.0000 [IU] | Freq: Every day | SUBCUTANEOUS | Status: DC
  Administered 2022-04-03 – 2022-04-04 (×2): 10 [IU] via SUBCUTANEOUS
  Filled 2022-04-03 (×3): qty 0.1

## 2022-04-03 MED ORDER — SODIUM ZIRCONIUM CYCLOSILICATE 10 G PO PACK
10.0000 g | PACK | Freq: Once | ORAL | Status: AC
  Administered 2022-04-03: 10 g via ORAL
  Filled 2022-04-03: qty 1

## 2022-04-03 MED ORDER — SODIUM BICARBONATE 8.4 % IV SOLN
50.0000 meq | Freq: Once | INTRAVENOUS | Status: AC
  Administered 2022-04-03: 50 meq via INTRAVENOUS
  Filled 2022-04-03: qty 50

## 2022-04-03 MED ORDER — LIDOCAINE-PRILOCAINE 2.5-2.5 % EX CREA: 1.0000 | TOPICAL_CREAM | CUTANEOUS | Status: DC | PRN

## 2022-04-03 MED ORDER — HEPARIN SODIUM (PORCINE) 1000 UNIT/ML DIALYSIS: 1000.0000 [IU] | INTRAMUSCULAR | Status: DC | PRN

## 2022-04-03 MED ORDER — FENTANYL CITRATE PF 50 MCG/ML IJ SOSY
25.0000 ug | PREFILLED_SYRINGE | Freq: Once | INTRAMUSCULAR | Status: AC
  Administered 2022-04-03: 25 ug via INTRAVENOUS
  Filled 2022-04-03: qty 1

## 2022-04-03 MED ORDER — LIDOCAINE HCL (PF) 1 % IJ SOLN: 5.0000 mL | INTRAMUSCULAR | Status: DC | PRN

## 2022-04-03 MED ORDER — CALCIUM GLUCONATE-NACL 2-0.675 GM/100ML-% IV SOLN
2.0000 g | Freq: Once | INTRAVENOUS | Status: AC
  Administered 2022-04-03: 2000 mg via INTRAVENOUS
  Filled 2022-04-03: qty 100

## 2022-04-03 MED ORDER — LOPERAMIDE HCL 2 MG PO CAPS: 2.0000 mg | ORAL_CAPSULE | Freq: Four times a day (QID) | ORAL | Status: DC | PRN

## 2022-04-03 MED ORDER — DEXTROSE 50 % IV SOLN
INTRAVENOUS | Status: AC
  Filled 2022-04-03: qty 50

## 2022-04-03 MED ORDER — DEXTROSE 50 % IV SOLN
25.0000 mL | Freq: Once | INTRAVENOUS | Status: AC
  Administered 2022-04-03: 25 mL via INTRAVENOUS
  Filled 2022-04-03: qty 50

## 2022-04-03 MED ORDER — PENTAFLUOROPROP-TETRAFLUOROETH EX AERO
INHALATION_SPRAY | CUTANEOUS | Status: AC
  Administered 2022-04-03: 30
  Filled 2022-04-03: qty 30

## 2022-04-03 NOTE — Progress Notes (Signed)
[facility]  [place] NPnotified that patient pain management is not effective I gave 15 mg of Oxi IR with Zanaflex she doesn't want any of pain medication that is ordered

## 2022-04-03 NOTE — Progress Notes (Signed)
Received patient in bed to unit.  Alert and oriented.  Informed consent signed and in chart.   Treatment initiated: 0821 Treatment completed: 1249  Patient tolerated well.  Transported back to the room  Alert, without acute distress.  Hand-off given to patient's nurse.   Access used: L AVG Access issues: High venous pressures, able to re cannulate with success pt had 2 hours left.   Total UF removed: 1.9L Medication(s) given: Vancomycin, Hectorol  Post HD VS: T- 97.6, P-90 R- 14, BP- 118/86 O2- [facility]

## 2022-04-03 NOTE — Progress Notes (Signed)
[facility] Abigail Butts NP was notified that patient [name] was 7.2 and that she is going to HD this morning

## 2022-04-03 NOTE — Progress Notes (Signed)
[facility] Progress Note   Subjective:   seen on dialysis.  Went to OR for L great toe amp yesterday.  AM K 7.2, getting dialysis now.    Objective Vitals:   04/03/22 0820 04/03/22 0900 04/03/22 0930 04/03/22 0944  BP: (!) 142/91 (!) 133/97 (!) 150/106 (!) 134/95  Pulse: 93 83 72 87  Resp: 15 17 20 18   Temp:      TempSrc:      SpO2: 95% 99% 97% 99%  Weight:      Height:       Physical Exam General:WDWN female in NAD Heart:RRR no mrg Lungs:CTAB, nml WOB on RA Abdomen:soft, NTND Extremities:no LE edema, L foot in OR dressing Dialysis Access: LU AVG +b/t   Filed Weights   04/01/22 0816 04/01/22 1212 04/02/22 1311  Weight: 85.1 kg 83 kg 78.9 kg    Intake/Output Summary (Last 24 hours) at 04/03/2022 0949 Last data filed at 04/03/2022 0834 Gross per 24 hour  Intake 616.5 ml  Output --  Net 616.5 ml    Additional Objective Labs: Basic Metabolic Panel: Recent Labs  Lab 04/02/22 0345 04/02/22 1331 04/03/22 0307 04/03/22 0833  NA 137 140 134* 138  K 4.3 4.6 7.2* 5.0  CL 97* 100 99 100  CO2 29  --  23 24  GLUCOSE 176* 107* 340* 318*  BUN 25* 32* 37* 42*  CREATININE 7.01* 8.30* 9.00* 9.04*  CALCIUM 7.8*  --  7.6* 8.3*  PHOS  --   --   --  5.5*   Liver Function Tests: Recent Labs  Lab 03/29/22 1424 04/03/22 0833  AST 15  --   ALT 11  --   ALKPHOS 59  --   BILITOT 0.6  --   PROT 9.4*  --   ALBUMIN 3.9 3.0*   CBC: Recent Labs  Lab 03/31/22 0325 04/01/22 0453 04/02/22 0345 04/02/22 1331 04/03/22 0307 04/03/22 0833  WBC 5.1 4.7 5.1  --  5.3 5.8  NEUTROABS 3.1 2.4 2.7  --  4.2  --   HGB 10.1* 9.3* 8.7* 12.2 9.6* 9.4*  HCT 33.5* 29.5* 27.5* 36.0 30.6* 31.3*  MCV 99.7 97.7 96.8  --  98.1 100.6*  PLT 175 167 176  --  188 215   Blood Culture    Component Value Date/Time   SDES BLOOD RIGHT HAND 03/29/2022 1424   SDES BLOOD RIGHT ARM 03/29/2022 1424   SPECREQUEST  03/29/2022 1424    BOTTLES DRAWN AEROBIC AND ANAEROBIC Blood Culture  adequate volume   SPECREQUEST  03/29/2022 1424    BOTTLES DRAWN AEROBIC AND ANAEROBIC Blood Culture adequate volume   CULT  03/29/2022 1424    NO GROWTH 5 DAYS Performed at [facility], [address], [place], [place] [address]    CULT  03/29/2022 1424    NO GROWTH 5 DAYS Performed at [facility], [address] [place],  [address]    REPTSTATUS 04/03/2022 FINAL 03/29/2022 1424   REPTSTATUS 04/03/2022 FINAL 03/29/2022 1424   CBG: Recent Labs  Lab 04/02/22 1634 04/02/22 1813 04/02/22 2022 04/02/22 2307 04/03/22 0453  GLUCAP 94 247* 307* 306* 305*   Medications:  anticoagulant sodium citrate     piperacillin-tazobactam (ZOSYN)  IV 2.25 g (04/02/22 2142)   vancomycin 750 mg (04/01/22 1624)    vitamin C  1,000 mg Oral Daily   calcium acetate  1,334 mg Oral TID WC   calcium carbonate (dosed in mg elemental calcium)  500 mg of elemental  calcium Oral Daily   darbepoetin (ARANESP) injection - DIALYSIS  60 mcg Subcutaneous Q Fri-1800   dextrose       docusate sodium  100 mg Oral Daily   doxercalciferol  2 mcg Intravenous Q T,Th,Sa-HD   famotidine  10 mg Oral Daily   gabapentin  600 mg Oral TID   heparin  5,000 Units Subcutaneous Q8H   insulin aspart  0-9 Units Subcutaneous TID WC   insulin glargine-yfgn  10 Units Subcutaneous Daily   nutrition supplement (JUVEN)  1 packet Oral BID BM   pantoprazole  40 mg Oral Daily   saccharomyces boulardii  250 mg Oral BID   sevelamer carbonate  2.4 g Oral TID WC   simvastatin  20 mg Oral QHS   zinc sulfate  220 mg Oral Daily    Dialysis Orders: AF-  TTS 4 hours  EDW 79. HD Bath 2K/3calc, Dialyzer 180, Heparin yes-  4000 then 2500 mid. Access AVG-  16 gauge - 300 BFR.  Last HD Sat-  left out 80.6-  likely needs EDW up-  last hgb 10.8, phos 6.2, pth 89  hect 2 q tx, mircera 3   Assessment/Plan: 1. Left foot ulcer - Had trauma to foot. MRI with suspicion for osteo; IV vanc/Zosyn. Per primary/podiatry. L great toe amp 1/19 2.  ESRD - HD TTS - HD 1/18 uses 16g needles.  Continue HD per regular schedule.  HD today 1/20, 1 hr 1K bath then rest 2 hr  3. HTN/volume-  BP/volume stable. On prn metoprolol.  UF as tolerated.  4. Anemia-  Hb 8.7.  Start ESA.  5. MBD of CKD -  Calcium a little low, use increase Ca bath.  Check phos. Continue hectorol/Renvela binder 6. Nutrition - Renal diet with fluid restriction  7. Diabetes - on insulin.  8. Hx seizures/pseudoseizures - per PMD.    Madelon Lips MD [facility] 04/03/2022,9:49 AM  LOS: 5 days

## 2022-04-03 NOTE — Progress Notes (Signed)
PROGRESS NOTE                                                                                                                                                                                                             Patient Demographics:    [name], is a 37 y.o. female, DOB - 1985/06/29, ZOX:[id number]  Outpatient Primary MD for the patient is [name], MD    LOS - 5  Admit date - 03/29/2022    Chief Complaint  Patient presents with   Wound Check       Brief Narrative (HPI from H&P)   -  37 y.o. female with medical history significant for type 1 diabetes, ESRD Tuesday Thursday Saturday,  seizures/pseudoseizures.  Patient presented to the ED with complaints of pain and swelling to her left lower foot , symptoms started with some pain to her left leg on Tuesday a week ago, 1/9.  2 days later she reports a blister on her left big toe suddenly opened up with drainage of foul-smelling discharge, she was admitted for left foot cellulitis with possible underlying osteomyelitis to [facility] and subsequently transferred to [facility] for further care.   Subjective:   Active   Assessment  & Plan :   L. Diabetic foot ulcer ([place])  - Ulcer to big left toe with surrounding cellulitis, foul smell, with swelling, extending towards ankle, has been placed on empiric IV vancomycin and Zosyn, cultures being followed, will trend ESR and CRP, podiatry on board.  MRI noted with suspicion for osteomyelitis of the first greater toe distal phalanx along with some nonspecific involvement of multiple metatarsal bones, continue supportive care with pain control, she was seen by to podiatrist who recommended left greater toe amputation but she refused and wanted second opinion, seen by Dr. Sharol Given on 04/02/2022 and she underwent left big toe amputation on the same date, follow intraoperative cultures.  Continue empiric antibiotics and  monitor.  ESRD on hemodialysis ([place]) on TTS schedule.  Nephrology on board.  She had hyperkalemia due to dietary noncompliance on 04/03/2022, counseled, hyperkalemia protocol followed, conveyed to nephrology early dialysis on 04/03/2022.  HX of seizures and pseudoseizures.  Monitor clinically.  Not on any medications.   Anemia due to chronic kidney disease - Hemoglobin 10.8.  Stable.  Diabetes mellitus type 1 ([place]) -  Resume Tresiba/long-acting insulin at reduced dose 8u daily( Home dose ~10u)  Lab Results  Component Value Date   HGBA1C 9.1 (H) 03/30/2022   CBG (last 3)  Recent Labs    04/02/22 2022 04/02/22 2307 04/03/22 0453  GLUCAP 307* 306* 305*        Condition - Fair  Family Communication  :  None  Code Status :  Full  Consults  :  Renal, Podiatry, Ortho  PUD Prophylaxis : PPI   Procedures  :     AMPUTATION LEFT GREAT TOE by Dr. 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 2. Substantial arthropathy of the Lisfranc joint with erosions and subcortical marrow edema particularly involving the bases of the second, third, and fourth metatarsals as well as the distal middle and lateral cuneiforms. The fourth metatarsal base is most involved, with substantial marrow edema tracking in the metaphysis and shaft, to such a degree that chronic osteomyelitis is not excluded (although arthropathy is favored given the involvement of the other regional osseous structures). 3. Substantial thickening and heterogeneous but primarily low signal in the plantar subcutaneous tissues of the foot extending along a 7.1 by 1.3 by 10.9 cm length, with loss of the normal fatty signal, possibly from chronic inflammation or fibrosis. 4. Hallux valgus. 5. Subcutaneous edema along dorsum of the foot, cellulitis not excluded.      Disposition Plan  :    Status is: Inpatient  DVT Prophylaxis   :    SCD's Start: 04/02/22 1551 heparin injection 5,000 Units Start: 03/30/22 1400  Lab Results  Component Value Date   PLT 188 04/03/2022    Diet :  Diet Order             Diet renal with fluid restriction Fluid restriction: 1200 mL Fluid; Room service appropriate? Yes; Fluid consistency: Thin  Diet effective now                    Inpatient Medications  Scheduled Meds:  vitamin C  1,000 mg Oral Daily   calcium acetate  1,334 mg Oral TID WC   calcium carbonate (dosed in mg elemental calcium)  500 mg of elemental calcium Oral Daily   darbepoetin (ARANESP) injection - DIALYSIS  60 mcg Subcutaneous Q Fri-1800   dextrose       docusate sodium  100 mg Oral Daily   doxercalciferol  2 mcg Intravenous Q T,Th,Sa-HD   famotidine  10 mg Oral Daily   gabapentin  600 mg Oral TID   heparin  5,000 Units Subcutaneous Q8H   insulin aspart  0-9 Units Subcutaneous TID WC   insulin glargine-yfgn  4 Units Subcutaneous Daily   nutrition supplement (JUVEN)  1 packet Oral BID BM   pantoprazole  40 mg Oral Daily   pentafluoroprop-tetrafluoroeth       saccharomyces boulardii  250 mg Oral BID   sevelamer carbonate  2.4 g Oral TID WC   simvastatin  20 mg Oral QHS   zinc sulfate  220 mg Oral Daily   Continuous Infusions:  sodium chloride 75 mL/hr at 04/03/22 1856   anticoagulant sodium citrate     piperacillin-tazobactam (ZOSYN)  IV 2.25 g (04/02/22 2142)   vancomycin 750 mg (04/01/22 1624)   PRN Meds:.acetaminophen, alteplase, alum & mag hydroxide-simeth, anticoagulant sodium citrate, bisacodyl, dextrose, guaiFENesin-dextromethorphan, heparin, hydrALAZINE, HYDROcodone-acetaminophen, HYDROmorphone (DILAUDID) injection, hydrOXYzine, labetalol, lidocaine (PF), lidocaine-prilocaine,  loperamide, metoprolol tartrate, metoprolol tartrate, ondansetron, oxyCODONE, oxyCODONE, pentafluoroprop-tetrafluoroeth, pentafluoroprop-tetrafluoroeth, phenol, polyethylene glycol, tiZANidine    Objective:    Vitals:   04/02/22 2305 04/03/22 0452 04/03/22 0810 04/03/22 0820  BP: (!) 143/95 (!) 119/92 (!) 149/117 (!) 142/91  Pulse: 81 68 87 93  Resp: 18 18 15 15   Temp: 98 F (36.7 C)  97.6 F (36.4 C)   TempSrc: Oral  Oral   SpO2: 97% 91% 96% 95%  Weight:      Height:        Wt Readings from Last 3 Encounters:  04/02/22 78.9 kg  12/31/21 79.7 kg  10/02/21 78 kg     Intake/Output Summary (Last 24 hours) at 04/03/2022 0830 Last data filed at 04/02/2022 1900 Gross per 24 hour  Intake 256.5 ml  Output --  Net 256.5 ml      Physical Exam  Awake Alert, No new F.N deficits, Normal affect [place].AT,PERRAL Supple Neck, No JVD,   Symmetrical Chest wall movement, Good air movement bilaterally, CTAB RRR,No Gallops,Rubs or new Murmurs,  +ve B.Sounds, Abd Soft, No tenderness,   Left foot post first big toe amputation under bandage with good sensation and remaining toes       Data Review:    Recent Labs  Lab 03/29/22 1424 03/30/22 0627 03/31/22 0325 04/01/22 0453 04/02/22 0345 04/02/22 1331 04/03/22 0307  WBC 9.6 5.1 5.1 4.7 5.1  --  5.3  HGB 10.8* 9.1* 10.1* 9.3* 8.7* 12.2 9.6*  HCT 35.0* 29.5* 33.5* 29.5* 27.5* 36.0 30.6*  PLT 221 170 175 167 176  --  188  MCV 98.3 99.0 99.7 97.7 96.8  --  98.1  MCH 30.3 30.5 30.1 30.8 30.6  --  30.8  MCHC 30.9 30.8 30.1 31.5 31.6  --  31.4  RDW 16.6* 16.7* 16.4* 16.3* 16.1*  --  16.0*  LYMPHSABS 1.1  --  1.1 1.4 1.4  --  0.7  MONOABS 0.3  --  0.4 0.4 0.5  --  0.2  EOSABS 0.3  --  0.3 0.4 0.4  --  0.0  BASOSABS 0.1  --  0.1 0.1 0.0  --  0.0    Recent Labs  Lab 03/29/22 1422 03/29/22 1424 03/29/22 1424 03/30/22 0627 03/31/22 0325 03/31/22 0650 04/01/22 0453 04/02/22 0345 04/02/22 1331 04/03/22 0307  NA  --  131*   < > 135 135  --  137 137 140 134*  K  --  5.5*   < > 4.9 5.0  --  4.7 4.3 4.6 7.2*  CL  --  92*   < > 98 98  --  97* 97* 100 99  CO2  --  25   < > 23 23  --  25 29  --  23  ANIONGAP  --  14   < > 14 14  --  15  11  --  12  GLUCOSE  --  242*   < > 148* 140*  --  176* 176* 107* 340*  BUN  --  39*   < > 47* 33*  --  45* 25* 32* 37*  CREATININE  --  9.44*   < > 11.03* 8.76*  --  10.69* 7.01* 8.30* 9.00*  AST  --  15  --   --   --   --   --   --   --   --   ALT  --  11  --   --   --   --   --   --   --   --  ALKPHOS  --  59  --   --   --   --   --   --   --   --   BILITOT  --  0.6  --   --   --   --   --   --   --   --   ALBUMIN  --  3.9  --   --   --   --   --   --   --   --   CRP 19.2*  --   --   --   --  14.3*  --   --   --   --   LATICACIDVEN  --  1.0  --   --   --   --   --   --   --   --   INR  --  1.1  --   --   --  1.1  --   --   --   --   HGBA1C  --   --   --  9.1*  --   --   --   --   --   --   BNP  --   --   --   --   --   --  381.3* 252.6*  --  256.2*  CALCIUM  --  8.4*   < > 7.8* 8.1*  --  8.5* 7.8*  --  7.6*   < > = values in this interval not displayed.    Radiology Reports US ARTERIAL ABI (SCREENING LOWER EXTREMITY)  Result Date: 03/30/2022 CLINICAL DATA:  Diabetic foot ulcer Hyperlipidemia EXAM: NONINVASIVE PHYSIOLOGIC VASCULAR STUDY OF BILATERAL LOWER EXTREMITIES TECHNIQUE: Evaluation of both lower extremities were performed at rest, including calculation of ankle-brachial indices with single level pressure measurements and doppler recording. COMPARISON:  None available. FINDINGS: Right ABI:  1.31 Left ABI:  1.30 Right Lower Extremity: Posterior tibial artery waveform is monophasic. Dorsalis pedis artery waveform is multiphasic. Left Lower Extremity: Multiphasic waveforms are seen in the posterior tibial and dorsalis pedis arteries. 1.0-1.4 Normal IMPRESSION: No definitive evidence of significant lower extremity arterial occlusive disease. Electronically Signed   By: Miachel Roux M.D.   On: 03/30/2022 10:17      Signature  -   Lala Lund M.D on 04/03/2022 at 8:30 AM   -  To page go to [email or web address]

## 2022-04-03 NOTE — Procedures (Signed)
Patient seen and examined on Hemodialysis. The procedure was supervised and I have made appropriate changes. BP (!) 134/95   Pulse 87   Temp 97.6 F (36.4 C) (Oral)   Resp 18   Ht 5\' 6"  (1.676 m)   Wt 78.9 kg   SpO2 99%   BMI 28.08 kg/m   QB 350 mL/ min via AVG (2 16 g needles), UF goal 2L  Tolerating treatment without complaints at this time. 1K bath for 1 hr and then the rest 2K d/t hyperkalemia   Madelon Lips MD [facility] [phone] 9:51 AM

## 2022-04-03 NOTE — Progress Notes (Signed)
CROSS COVER NOTE  NAME: CORNELIOUS DIVEN MRN: [id number] DOB : 1985/05/03    Date of Service   04/03/2022   HPI/Events of Note   RN reports patient had episode of diarrhea that appeared "bright red blood tinged."    Vital signs stable.  No acute changes reported by RN.   Interventions/ Plan   Fecal occult blood Check hemoglobin this morning RN to continue monitoring for bleeding or other acute changes.       Raenette Rover, DNP, [place]

## 2022-04-03 NOTE — Progress Notes (Signed)
Patient ID: [name], female   DOB: [birth year], 37 y.o.   MRN: [id number] Patient is postoperative day 1 amputation left great toe.  Patient states she does have numbness in her entire left leg.  I loosen the Coban wrap.  Patient may progress with therapy with touchdown weightbearing on the left.

## 2022-04-03 NOTE — Progress Notes (Signed)
Physical Therapy Re-evaluation Patient Details Name: [name] MRN: [id number] DOB: 03/18/1985 Today's Date: 04/03/2022   History of Present Illness 37 yo female with onset of blister on L plantar surface of great toe was admitted 1/15 and noted osteomyelitis on imaging, as well as  substantial arthropathy of the Lisfranc joint with erosions and subcortical marrow edema involving the bases of the second, third, and fourth metatarsals as well as the distal middle and lateral cuneiforms. 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  PMHx: ESRD, HD, anemia, DM, seizures, XXX syndrome    PT Comments    Pt was re-evaluated today post amputation of the L great toe. Pt was very active prior to hospitalization. She currently requires supervision for all functional activities due to significant difficulty maintaining WB precautions. Pt had difficulty remembering WB precautions and was prone to placing weight through anterior foot where she had amputation. Pt was educated extensively on the importance of placing foot flat or heel first and touch down WB restrictions using this physical therapists foot and egg analogy to ensure that pt was understanding. Pt was able to perform sit to stand and gait maintaining TDWB status but would frequently need reminders to maintain and to remove weight from the anterior foot. Due to pt current functional status, prior level of function and home set up recommending HHPT on discharge from acute care hospital setting in order to decrease risk for falls, incisional dehiscence and to maintain integrity of operative site with safe functional mobility to prevent re-hospitalization. Pt would benefit from RW, [facility] and knee scooter at home for mobility to assist with maintaining WB precautions due to difficulty with memory at this time.     Recommendations for follow up therapy are one component of a multi-disciplinary discharge planning process, led by the  attending physician.  Recommendations may be updated based on patient status, additional functional criteria and insurance authorization.  Follow Up Recommendations  Home health PT     Assistance Recommended at Discharge Set up Supervision/Assistance  Patient can return home with the following Assistance with cooking/housework;Help with stairs or ramp for entrance;A little help with walking and/or transfers;Assist for transportation   Equipment Recommendations  Rolling walker (2 wheels);Other (comment);BSC/3in1 (knee scooter)    Recommendations for Other Services       Precautions / Restrictions Precautions Precautions: Other (comment) Precaution Comments: orthopedic shoe on the L Restrictions Weight Bearing Restrictions: Yes LLE Weight Bearing: Touchdown weight bearing Other Position/Activity Restrictions: toe touch WB     Mobility  Bed Mobility Overal bed mobility: Modified Independent               Patient Response: Cooperative  Transfers Overall transfer level: Needs assistance Equipment used: Rolling walker (2 wheels) Transfers: Sit to/from Stand Sit to Stand: Supervision           General transfer comment: To maintain TTWB status    Ambulation/Gait Ambulation/Gait assistance: Supervision Gait Distance (Feet): 50 Feet Assistive device: Rolling walker (2 wheels) Gait Pattern/deviations: Step-to pattern Gait velocity: decreased cadence Gait velocity interpretation: <1.31 ft/sec, indicative of household ambulator   General Gait Details: Hop to pattern using LLE for TTWB using RW. Supervision in order to make sure pt maintains WB Status       Balance Overall balance assessment: Mild deficits observed, not formally tested                Cognition Arousal/Alertness: Awake/alert Behavior During Therapy: [facility] for tasks  assessed/performed Overall Cognitive Status: Difficult to assess         General Comments: Pt asked the same questions  multiple times. Pt baseline cognitive status unknown. Difficulty understanding WB status and recommended equipment for home.           General Comments General comments (skin integrity, edema, etc.): Pt was WB on the LLE initially when entered the room and stated that she had been taking herself to the bathroom and ambulating without an AD. Pt was educated extensively on her WB precautions. Pt had difficulty understanding with multiple venues of teaching utilized in order to assist pt in understanding. Utilized this physical therapists foot under pt operative foot in order for pt to maintain with good adherence when foot under pt foot but when removed pt required frequent re-orienting to WB status and to keep wgt off of the L anterior foot. Pt WB orders were written on board. Discussed with nursing pt difficulties with maintaining WB status.      Pertinent Vitals/Pain Pain Assessment Pain Assessment: No/denies pain (states she has some itching)     PT Goals (current goals can now be found in the care plan section) Acute Rehab PT Goals Patient Stated Goal: to go home and L foot to stop hurting PT Goal Formulation: With patient Time For Goal Achievement: 04/07/22 Potential to Achieve Goals: Good Progress towards PT goals: Progressing toward goals    Frequency    Min 5X/week      PT Plan Current plan remains appropriate       AM-PAC PT "6 Clicks" Mobility   Outcome Measure  Help needed turning from your back to your side while in a flat bed without using bedrails?: None Help needed moving from lying on your back to sitting on the side of a flat bed without using bedrails?: None Help needed moving to and from a bed to a chair (including a wheelchair)?: A Little Help needed standing up from a chair using your arms (e.g., wheelchair or bedside chair)?: A Little Help needed to walk in hospital room?: A Little Help needed climbing 3-5 steps with a railing? : A Lot 6 Click Score:  19    End of Session Equipment Utilized During Treatment: Gait belt Activity Tolerance: Patient tolerated treatment well Patient left: in bed;with call bell/phone within reach Nurse Communication: Mobility status;Precautions;Weight bearing status PT Visit Diagnosis: Unsteadiness on feet (R26.81);Pain Pain - Right/Left: Left Pain - part of body: Ankle and joints of foot     Time: 8280-0349 PT Time Calculation (min) (ACUTE ONLY): 38 min  Charges:  $Gait Training: 8-22 mins $Therapeutic Activity: 8-22 mins                    Tomma Rakers, DPT, CLT  Acute Rehabilitation Services Office: [phone] (Secure chat preferred)    Ander Purpura 04/03/2022, 4:49 PM

## 2022-04-03 NOTE — Plan of Care (Signed)
  Problem: Health Behavior/Discharge Planning: Goal: Ability to manage health-related needs will improve Outcome: Progressing   Problem: Education: Goal: Knowledge of General Education information will improve Description: Including pain rating scale, medication(s)/side effects and non-pharmacologic comfort measures Outcome: Progressing   Problem: Clinical Measurements: Goal: Ability to maintain clinical measurements within normal limits will improve Outcome: Progressing Goal: Will remain free from infection Outcome: Progressing Goal: Diagnostic test results will improve Outcome: Progressing Goal: Respiratory complications will improve Outcome: Progressing Goal: Cardiovascular complication will be avoided Outcome: Progressing   Problem: Activity: Goal: Risk for activity intolerance will decrease Outcome: Progressing   Problem: Nutrition: Goal: Adequate nutrition will be maintained Outcome: Progressing   Problem: Coping: Goal: Level of anxiety will decrease Outcome: Progressing Problem: Pain Managment: Goal: General experience of comfort will improve Outcome: Progressing   Problem: Safety: Goal: Ability to remain free from injury will improve Outcome: Progressing   Problem: Skin Integrity: Goal: Risk for impaired skin integrity will decrease Outcome: Progressing     Problem: Elimination: Goal: Will not experience complications related to bowel motility Outcome: Progressing Goal: Will not experience complications related to urinary retention Outcome: Progressing

## 2022-04-03 NOTE — Progress Notes (Signed)
Pt requested Dilaudid at 9:15am once medication was pulled from pyxis and ready to administer patient stated she wanted to wait because the medication was "short acting' and may dialyze out. Medication not given and wasted with Development worker, community.

## 2022-04-04 DIAGNOSIS — S91302A Unspecified open wound, left foot, initial encounter: Secondary | ICD-10-CM | POA: Diagnosis not present

## 2022-04-04 LAB — BASIC METABOLIC PANEL
Anion gap: 16 — ABNORMAL HIGH (ref 5–15)
BUN: 27 mg/dL — ABNORMAL HIGH (ref 6–20)
CO2: 22 mmol/L (ref 22–32)
Calcium: 7.8 mg/dL — ABNORMAL LOW (ref 8.9–10.3)
Chloride: 94 mmol/L — ABNORMAL LOW (ref 98–111)
Creatinine, Ser: 5.96 mg/dL — ABNORMAL HIGH (ref 0.44–1.00)
GFR, Estimated: 9 mL/min — ABNORMAL LOW (ref >60)
Glucose, Bld: 356 mg/dL — ABNORMAL HIGH (ref 70–99)
Potassium: 3.5 mmol/L (ref 3.5–5.1)
Sodium: 132 mmol/L — ABNORMAL LOW (ref 135–145)

## 2022-04-04 LAB — CBC WITH DIFFERENTIAL/PLATELET
Abs Immature Granulocytes: 0.06 10*3/uL (ref 0.00–0.07)
Basophils Absolute: 0.1 10*3/uL (ref 0.0–0.1)
Basophils Relative: 1 %
Eosinophils Absolute: 0.3 10*3/uL (ref 0.0–0.5)
Eosinophils Relative: 4 %
HCT: 27.6 % — ABNORMAL LOW (ref 36.0–46.0)
Hemoglobin: 8.6 g/dL — ABNORMAL LOW (ref 12.0–15.0)
Immature Granulocytes: 1 %
Lymphocytes Relative: 32 %
Lymphs Abs: 2.3 10*3/uL (ref 0.7–4.0)
MCH: 30.8 pg (ref 26.0–34.0)
MCHC: 31.2 g/dL (ref 30.0–36.0)
MCV: 98.9 fL (ref 80.0–100.0)
Monocytes Absolute: 0.5 10*3/uL (ref 0.1–1.0)
Monocytes Relative: 7 %
Neutro Abs: 4 10*3/uL (ref 1.7–7.7)
Neutrophils Relative %: 55 %
Platelets: 201 10*3/uL (ref 150–400)
RBC: 2.79 MIL/uL — ABNORMAL LOW (ref 3.87–5.11)
RDW: 16.1 % — ABNORMAL HIGH (ref 11.5–15.5)
WBC: 7.1 10*3/uL (ref 4.0–10.5)
nRBC: 0 % (ref 0.0–0.2)

## 2022-04-04 LAB — GLUCOSE, CAPILLARY
Glucose-Capillary: 142 mg/dL — ABNORMAL HIGH (ref 70–99)
Glucose-Capillary: 201 mg/dL — ABNORMAL HIGH (ref 70–99)
Glucose-Capillary: 328 mg/dL — ABNORMAL HIGH (ref 70–99)
Glucose-Capillary: 380 mg/dL — ABNORMAL HIGH (ref 70–99)

## 2022-04-04 LAB — BRAIN NATRIURETIC PEPTIDE: B Natriuretic Peptide: 186.5 pg/mL — ABNORMAL HIGH (ref 0.0–100.0)

## 2022-04-04 MED ORDER — INSULIN ASPART 100 UNIT/ML IJ SOLN
5.0000 [IU] | Freq: Once | INTRAMUSCULAR | Status: AC
  Administered 2022-04-04: 5 [IU] via INTRAVENOUS

## 2022-04-04 MED ORDER — DOXYCYCLINE HYCLATE 100 MG PO TABS
100.0000 mg | ORAL_TABLET | Freq: Two times a day (BID) | ORAL | Status: DC
  Administered 2022-04-04: 100 mg via ORAL
  Filled 2022-04-04: qty 1

## 2022-04-04 MED ORDER — DOXYCYCLINE HYCLATE 100 MG PO CAPS: 100.0000 mg | ORAL_CAPSULE | Freq: Two times a day (BID) | ORAL | 0 refills | Status: DC

## 2022-04-04 NOTE — Progress Notes (Signed)
Physical Therapy Treatment Patient Details Name: [name] MRN: [id number] DOB: 20-Sep-1985 Today's Date: 04/04/2022   History of Present Illness 37 yo female with onset of blister on L plantar surface of great toe was admitted 1/15 and noted osteomyelitis on imaging, as well as  substantial arthropathy of the Lisfranc joint with erosions and subcortical marrow edema involving the bases of the second, third, and fourth metatarsals as well as the distal middle and lateral cuneiforms. 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  PMHx: ESRD, HD, anemia, DM, seizures, XXX syndrome    PT Comments    Pt has improved today from yesterday with WB status. Slight increase in gait today due to pt is concerned about ambulation at home. Maintained WB precautions better with only occasional verbal cues to keep weight off the LLE using this physical therapists foot under pt foot to ensure that pt is maintaining adequately to protect surgical site. Pt was able to navigate stairs per home set up with Min A for stabilization of RW. Due to current functional status, home set up and available assistance at home recommending skilled physical therapy services in HHPT on discharge from acute care setting in order to decrease risk for falls, injury and re-hospitalization. Due to pt difficulty remembering WB Status it would be very beneficial to maintain integrity of pt surgery for pt to receive a knee scooter and RW.    Recommendations for follow up therapy are one component of a multi-disciplinary discharge planning process, led by the attending physician.  Recommendations may be updated based on patient status, additional functional criteria and insurance authorization.  Follow Up Recommendations  Home health PT     Assistance Recommended at Discharge Set up Supervision/Assistance  Patient can return home with the following Assistance with cooking/housework;Help with stairs or ramp for entrance;A  little help with walking and/or transfers;Assist for transportation   Equipment Recommendations  Rolling walker (2 wheels);Other (comment);BSC/3in1 (knee scooter)    Recommendations for Other Services       Precautions / Restrictions Precautions Precautions: Other (comment) Precaution Comments: orthopedic shoe on the L Restrictions Weight Bearing Restrictions: Yes LLE Weight Bearing: Touchdown weight bearing Other Position/Activity Restrictions: toe touch WB     Mobility  Bed Mobility Overal bed mobility: Modified Independent               Patient Response: Cooperative  Transfers Overall transfer level: Needs assistance Equipment used: Rolling walker (2 wheels) Transfers: Sit to/from Stand Sit to Stand: Supervision           General transfer comment: To maintain TTWB status    Ambulation/Gait Ambulation/Gait assistance: Supervision Gait Distance (Feet): 100 Feet Assistive device: Rolling walker (2 wheels) Gait Pattern/deviations: Step-to pattern Gait velocity: decreased cadence Gait velocity interpretation: <1.31 ft/sec, indicative of household ambulator   General Gait Details: touch down LLE using RW with step to using the RLE; improved from yesterday using this physical therapists foot under the L to ensure that pt is maintaining WB status.   Stairs Stairs: Yes Stairs assistance: Min assist Stair Management: No rails, Backwards, With walker Number of Stairs: 2 General stair comments: Min A to stabilize walker. Pt was shown how to navigate stairs per home set up using RW. Pt was able with MIN A for stability at RW.   Wheelchair Mobility    Modified Rankin (Stroke Patients Only)       Balance Overall balance assessment: Mild deficits observed, not formally tested  Cognition Arousal/Alertness: Awake/alert Behavior During Therapy: WFL for tasks assessed/performed Overall Cognitive Status: Difficult to assess         General Comments:  Pt baseline cognitive status unknown. Pt tends to repeat herself and ask questions that are not related to task.           General Comments General comments (skin integrity, edema, etc.): Pt had improved re-call of WB status this session. She was resting flat foot on the groun rather than anterior foot to protect surgical site. Continues to require intermittent verbal cues to maintain WB precautions. Pt educated on importance of limiting mobility and elevating leg as much as possible.      Pertinent Vitals/Pain Pain Assessment Pain Assessment: No/denies pain     PT Goals (current goals can now be found in the care plan section) Acute Rehab PT Goals Patient Stated Goal: to go home and L foot to stop hurting PT Goal Formulation: With patient Time For Goal Achievement: 04/07/22 Potential to Achieve Goals: Good Progress towards PT goals: Progressing toward goals    Frequency    Min 5X/week      PT Plan Current plan remains appropriate       AM-PAC PT "6 Clicks" Mobility   Outcome Measure  Help needed turning from your back to your side while in a flat bed without using bedrails?: None Help needed moving from lying on your back to sitting on the side of a flat bed without using bedrails?: None Help needed moving to and from a bed to a chair (including a wheelchair)?: A Little Help needed standing up from a chair using your arms (e.g., wheelchair or bedside chair)?: A Little Help needed to walk in hospital room?: A Little Help needed climbing 3-5 steps with a railing? : A Lot 6 Click Score: 19    End of Session Equipment Utilized During Treatment: Gait belt Activity Tolerance: Patient tolerated treatment well Patient left: in bed;with call bell/phone within reach Nurse Communication: Mobility status PT Visit Diagnosis: Unsteadiness on feet (R26.81);Pain Pain - Right/Left: Left Pain - part of body: Ankle and joints of foot     Time: 1012-1050 PT Time Calculation  (min) (ACUTE ONLY): 38 min  Charges:  $Gait Training: 23-37 mins $Therapeutic Activity: 8-22 mins                     Tomma Rakers, DPT, [place] Office: [phone] (Secure chat preferred)    [name] 04/04/2022, 11:32 AM

## 2022-04-04 NOTE — Plan of Care (Signed)
                                      [facility]                            [address] [place], [place] [address]      [name] was admitted to the Hospital on 03/29/2022 and Discharged  04/04/2022 and should be excused from work/school   for  14  days starting from date -  03/29/2022 , may return to work/school without any restrictions.  Call Lala Lund MD, [facility]  [phone] with questions.  Lala Lund M.D on 04/04/2022,at 12:12 PM  [facility]   Office  [phone]

## 2022-04-04 NOTE — Discharge Summary (Addendum)
TA FAIR MQK:[id number] DOB: [birth year] DOA: 03/29/2022  PCP: [name], MD  Admit date: 03/29/2022  Discharge date: 04/04/2022  Admitted From: Home   Disposition:  Home   Recommendations for Outpatient Follow-up:   Follow up with PCP in 1-2 weeks  PCP Please obtain BMP/CBC, 2 view CXR in 1week,  (see Discharge instructions)   PCP Please follow up on the following pending results:    Home Health: PT, OT Equipment/Devices: cane  Consultations: Renal, Ortho Discharge Condition: Stable    CODE STATUS: Full    Diet Recommendation: Low carbohydrate-renal diet, 1.5 L fluid restriction per day  Chief Complaint  Patient presents with   Wound Check     Brief history of present illness from the day of admission and additional interim summary    37 y.o. female with medical history significant for type 1 diabetes, ESRD Tuesday Thursday Saturday,  seizures/pseudoseizures.  Patient presented to the ED with complaints of pain and swelling to her left lower foot , symptoms started with some pain to her left leg on Tuesday a week ago, 1/9.  2 days later she reports a blister on her left big toe suddenly opened up with drainage of foul-smelling discharge, she was admitted for left foot cellulitis with possible underlying osteomyelitis to [facility] and subsequently transferred to [facility] for further care.                                                                  Hospital Course   L. Diabetic foot ulcer ([place]), left great toe osteomyelitis Ulcer to big left toe with surrounding cellulitis, foul smell, with swelling, extending towards ankle, has been placed on empiric IV vancomycin and Zosyn, received wound care, underwent MRI of the left foot.  MRI noted with suspicion for osteomyelitis of the first  greater toe distal phalanx along with some nonspecific involvement of multiple metatarsal bones, continue supportive care with pain control, she was seen by to podiatrist who recommended left greater toe amputation but she refused and wanted second opinion, seen by Dr. Sharol Given on 04/02/2022 and she underwent left big toe amputation on the same date, per Dr. Sharol Given stable for home discharge.  No extended antibiotics needed.  He will follow the patient in the office within 5 to 7 days, reviewed the case with him in detail on 04/04/2022.   ESRD on hemodialysis ([place]) on TTS schedule.  Nephrology was on board continue TTS schedule postdischarge.  HX of seizures and pseudoseizures.  Monitor clinically.  Not on any medications.   Anemia due to chronic kidney disease - Hemoglobin 10.8.  Stable.   Diabetes mellitus type 1 ([place]) -continue home regimen.  Noncompliant with diet.  Counseled.  Chronic pain.  Continue home narcotic regimen.  She definitely has narcotic  seeking behavior.  Was not forthcoming about her home medication regimen and wanted more pain pills to go home when confronted she agreed that she had recently received narcotics at home.  In no distress whatsoever desires IV narcotics.   Discharge diagnosis     Principal Problem:   Diabetic foot ulcer ([place]) Active Problems:   Sepsis ([place])   ESRD on hemodialysis ([place])   Anemia due to chronic kidney disease   XXX syndrome   Diabetes mellitus type 1 ([place])   Seizures ([place])   Osteomyelitis of great toe of left foot ([place])   Open wound of left foot    Discharge instructions    Discharge Instructions     Discharge instructions   Complete by: As directed    Please keep your Left foot dry at all times.  Follow with Primary MD [name], MD in 7 days   Get CBC, CMP, 2 view Chest X ray -  checked next visit with your primary MD    Activity: touchdown weightbearing on the left with Full fall precautions use walker/cane & assistance as  needed  Disposition Home    Diet: Renal-low carbohydrate diet with 1.5 L fluid restriction per day, check CBGs q. ACH S.     Special Instructions: If you have smoked or chewed Tobacco  in the last 2 yrs please stop smoking, stop any regular Alcohol  and or any Recreational drug use.  On your next visit with your primary care physician please Get Medicines reviewed and adjusted.  Please request your Prim.MD to go over all Hospital Tests and Procedure/Radiological results at the follow up, please get all Hospital records sent to your Prim MD by signing hospital release before you go home.  If you experience worsening of your admission symptoms, develop shortness of breath, life threatening emergency, suicidal or homicidal thoughts you must seek medical attention immediately by calling 911 or calling your MD immediately  if symptoms less severe.  You Must read complete instructions/literature along with all the possible adverse reactions/side effects for all the Medicines you take and that have been prescribed to you. Take any new Medicines after you have completely understood and accpet all the possible adverse reactions/side effects.   Discharge wound care:   Complete by: As directed    Keep your Left foot clean and dry at all times, first dressing change and Dr. Jess Barters office.   Increase activity slowly   Complete by: As directed    Touch down weight bearing   Complete by: As directed    Laterality: left   Extremity: Lower       Discharge Medications   Allergies as of 04/04/2022       Reactions   Diphenhydramine Anaphylaxis   Motrin [ibuprofen] Shortness Of Breath, Itching   Contrast Media [iodinated Contrast Media] Itching   Peanut-containing Drug Products Itching   Able to tolerate when cooked in foods   Shellfish Allergy Hives   Banana Itching, Nausea And Vomiting, Other (See Comments)   Sick on the stomach   Chlorhexidine Itching   Ferrous Sulfate Itching   Iron Dextran  Itching, Other (See Comments)   Vein irritation        Medication List     STOP taking these medications    Aspercreme/Aloe 10 % cream Generic drug: trolamine salicylate   calcium acetate 667 MG capsule Commonly known as: PHOSLO   hydrOXYzine 50 MG tablet Commonly known as: ATARAX   ondansetron 8 MG disintegrating tablet  Commonly known as: ZOFRAN-ODT   silver sulfADIAZINE 1 % cream Commonly known as: SILVADENE   tiZANidine 2 MG tablet Commonly known as: ZANAFLEX       TAKE these medications    acetaminophen 500 MG tablet Commonly known as: TYLENOL Take 500-1,000 mg by mouth every 6 (six) hours as needed for moderate pain.   calcium carbonate (dosed in mg elemental calcium) 1250 MG/5ML Susp Take 5 mLs by mouth daily.   cetirizine 10 MG tablet Commonly known as: ZYRTEC Take 10 mg by mouth daily as needed for allergies.   doxycycline 100 MG capsule Commonly known as: VIBRAMYCIN Take 1 capsule (100 mg total) by mouth 2 (two) times daily.   famotidine 40 MG tablet Commonly known as: PEPCID Take 1 tablet (40 mg total) by mouth 2 (two) times daily as needed for heartburn or indigestion. What changed: when to take this   fluticasone 50 MCG/ACT nasal spray Commonly known as: FLONASE Place 2 sprays into both nostrils daily as needed for allergies.   gabapentin 600 MG tablet Commonly known as: NEURONTIN Take 600 mg by mouth 3 (three) times daily.   insulin degludec 100 UNIT/ML FlexTouch Pen Commonly known as: TRESIBA Inject 5-10 Units into the skin daily. What changed: how much to take   lidocaine-prilocaine cream Commonly known as: EMLA Apply 1 application topically daily as needed [facility]).   loperamide 2 MG capsule Commonly known as: IMODIUM Take 2 mg by mouth as needed for diarrhea or loose stools.   metoprolol tartrate 25 MG tablet Commonly known as: LOPRESSOR Take 1 tablet (25 mg total) by mouth daily as needed (for systolic blood pressure or 135  or above).   NovoLOG FlexPen 100 UNIT/ML FlexPen Generic drug: insulin aspart Inject 0-15 Units into the skin in the morning, at noon, in the evening, and at bedtime. Sliding Scale insulin   omeprazole 40 MG capsule Commonly known as: PRILOSEC Take 1 capsule (40 mg total) by mouth 2 (two) times daily before a meal. Open capsule and place granules in water or applesauce to swallow   oxyCODONE-acetaminophen 5-325 MG tablet Commonly known as: PERCOCET/ROXICET Take 1 tablet by mouth daily as needed for severe pain.   Renvela 2.4 g Pack Generic drug: sevelamer carbonate Take 2.4 g by mouth 3 (three) times daily.   simvastatin 20 MG tablet Commonly known as: ZOCOR Take 20 mg by mouth at bedtime.               Durable Medical Equipment  (From admission, onward)           Start     Ordered   04/04/22 1129  For home use only DME Walker rolling  Once       Question Answer Comment  Walker: With [place]   Patient needs a walker to treat with the following condition Decreased functional mobility and endurance      04/04/22 1129   04/04/22 1129  For home use only DME Bedside commode  Once       Comments: Patient does not have a bathroom in her room and mobility is limited at this time  Question:  Patient needs a bedside commode to treat with the following condition  Answer:  Decreased functional mobility and endurance   04/04/22 1129   04/04/22 0928  For home use only DME Cane  Once        04/04/22 8416  Discharge Care Instructions  (From admission, onward)           Start     Ordered   04/04/22 0000  Discharge wound care:       Comments: Keep your Left foot clean and dry at all times, first dressing change and Dr. Jess Barters office.   04/04/22 0930   04/02/22 0000  Touch down weight bearing       Question Answer Comment  Laterality left   Extremity Lower      04/02/22 1459             Follow-up Information     Newt Minion, MD.  Schedule an appointment as soon as possible for a visit in 1 week(s).   Specialty: Orthopedic Surgery Contact information: [place] [address] [phone]         [name], MD. Schedule an appointment as soon as possible for a visit in 1 week(s).   Specialty: Internal Medicine Contact information: [address] [place] [address] [phone]                 Major procedures and Radiology Reports - PLEASE review detailed and final reports thoroughly  -     US ARTERIAL ABI (SCREENING LOWER EXTREMITY)  Result Date: 03/30/2022 CLINICAL DATA:  Diabetic foot ulcer Hyperlipidemia EXAM: NONINVASIVE PHYSIOLOGIC VASCULAR STUDY OF BILATERAL LOWER EXTREMITIES TECHNIQUE: Evaluation of both lower extremities were performed at rest, including calculation of ankle-brachial indices with single level pressure measurements and doppler recording. COMPARISON:  None available. FINDINGS: Right ABI:  1.31 Left ABI:  1.30 Right Lower Extremity: Posterior tibial artery waveform is monophasic. Dorsalis pedis artery waveform is multiphasic. Left Lower Extremity: Multiphasic waveforms are seen in the posterior tibial and dorsalis pedis arteries. 1.0-1.4 Normal IMPRESSION: No definitive evidence of significant lower extremity arterial occlusive disease. Electronically Signed   By: Miachel Roux M.D.   On: 03/30/2022 10:17   MR FOOT LEFT WO CONTRAST  Result Date: 03/29/2022 CLINICAL DATA:  Diabetic foot ulcer, purulent drainage. Sepsis. End-stage renal disease. Wound of the great toe. EXAM: MRI OF THE LEFT FOOT WITHOUT CONTRAST TECHNIQUE: Multiplanar, multisequence MR imaging of the left forefoot was performed. No intravenous contrast was administered. COMPARISON:  radiographs 03/29/2022 FINDINGS: Bones/Joint/Cartilage There is abnormal marrow edema throughout the distal phalanx great toe with surrounding fluid around the periosteum of the tuft of the great toe, compatible with  active osteomyelitis and periostitis. Substantial arthropathy the Lisfranc joint with erosions and subcortical marrow edema particularly involving the bases of the second, third, and fourth metatarsals as well as the distal middle and lateral cuneiforms. The fourth metatarsal bases most involved, with substantial marrow edema tracking in the metaphysis and shaft, to such a degree that chronic osteomyelitis is not excluded Hallux valgus. Ligaments The Lisfranc ligament appears intact. Muscles and Tendons No intramuscular abscess or compelling evidence of myositis. Soft tissues Plantar ulceration of the great toe. There appears to be a fluid collection surrounding the tuft of the great toe draining through this plantar ulceration on image 12 series 7, further cementing the great toe involvement as being due to osteomyelitis. Subcutaneous edema along dorsum of the foot, cellulitis not excluded. Substantial thickening and heterogeneous but primarily low signal in the plantar subcutaneous tissues of the foot extending along a 7.1 by 1.3 by over 10.9 cm length, with loss of the normal fatty signal, possibly from chronic inflammation or fibrosis. I favor the former. IMPRESSION: 1. Osteomyelitis of the  distal phalanx great toe, with surrounding fluid collection draining through the plantar ulceration. 2. Substantial arthropathy of the Lisfranc joint with erosions and subcortical marrow edema particularly involving the bases of the second, third, and fourth metatarsals as well as the distal middle and lateral cuneiforms. The fourth metatarsal base is most involved, with substantial marrow edema tracking in the metaphysis and shaft, to such a degree that chronic osteomyelitis is not excluded (although arthropathy is favored given the involvement of the other regional osseous structures). 3. Substantial thickening and heterogeneous but primarily low signal in the plantar subcutaneous tissues of the foot extending along a 7.1  by 1.3 by 10.9 cm length, with loss of the normal fatty signal, possibly from chronic inflammation or fibrosis. 4. Hallux valgus. 5. Subcutaneous edema along dorsum of the foot, cellulitis not excluded. Electronically Signed   By: Van Clines M.D.   On: 03/29/2022 18:28   DG Foot Complete Left  Result Date: 03/29/2022 CLINICAL DATA:  Foot pain, wound/ulcer. EXAM: LEFT FOOT - COMPLETE 3+ VIEW COMPARISON:  02/26/2021 FINDINGS: Minimal degenerative change of the first MTP joint and midfoot. No focal bone destruction to suggest osteomyelitis. Mild soft tissue swelling over the mid to forefoot. No air within the soft tissues. Small inferior calcaneal spur. IMPRESSION: 1. No acute findings. No evidence of osteomyelitis. 2. Mild soft tissue swelling. Electronically Signed   By: Marin Olp M.D.   On: 03/29/2022 14:58   DG Chest Port 1 View  Result Date: 03/29/2022 CLINICAL DATA:  Foot pain/ulcer. EXAM: PORTABLE CHEST 1 VIEW COMPARISON:  12/27/2021 FINDINGS: Lungs are hypoinflated and otherwise clear. Cardiomediastinal silhouette and remainder of the exam is unchanged. IMPRESSION: Hypoinflation without acute cardiopulmonary disease. Electronically Signed   By: Marin Olp M.D.   On: 03/29/2022 14:52    Micro Results    Recent Results (from the past 240 hour(s))  Culture, blood (Routine x 2)     Status: None   Collection Time: 03/29/22  2:24 PM   Specimen: BLOOD RIGHT HAND  Result Value Ref Range Status   Specimen Description BLOOD RIGHT HAND  Final   Special Requests   Final    BOTTLES DRAWN AEROBIC AND ANAEROBIC Blood Culture adequate volume   Culture   Final    NO GROWTH 5 DAYS Performed at [facility], [address], [place], [place] [address]    Report Status 04/03/2022 FINAL  Final  Culture, blood (Routine x 2)     Status: None   Collection Time: 03/29/22  2:24 PM   Specimen: BLOOD RIGHT ARM  Result Value Ref Range Status   Specimen Description BLOOD RIGHT ARM  Final   Special  Requests   Final    BOTTLES DRAWN AEROBIC AND ANAEROBIC Blood Culture adequate volume   Culture   Final    NO GROWTH 5 DAYS Performed at [facility], [address], [place], [place] [address]    Report Status 04/03/2022 FINAL  Final  Resp panel by RT-PCR (RSV, Flu A&B, Covid) Anterior Nasal Swab     Status: None   Collection Time: 03/29/22  8:01 PM   Specimen: Anterior Nasal Swab  Result Value Ref Range Status   SARS Coronavirus 2 by RT PCR NEGATIVE NEGATIVE Final    Comment: (NOTE) SARS-CoV-2 target nucleic acids are NOT DETECTED.  The SARS-CoV-2 RNA is generally detectable in upper respiratory specimens during the acute phase of infection. The lowest concentration of SARS-CoV-2 viral copies this assay can detect is 138 copies/mL.  A negative result does not preclude SARS-Cov-2 infection and should not be used as the sole basis for treatment or other patient management decisions. A negative result may occur with  improper specimen collection/handling, submission of specimen other than nasopharyngeal swab, presence of viral mutation(s) within the areas targeted by this assay, and inadequate number of viral copies(<138 copies/mL). A negative result must be combined with clinical observations, patient history, and epidemiological information. The expected result is Negative.  Fact Sheet for Patients:  EntrepreneurPulse.com.au  Fact Sheet for Healthcare Providers:  IncredibleEmployment.be  This test is no t yet approved or cleared by the [place] FDA and  has been authorized for detection and/or diagnosis of SARS-CoV-2 by FDA under an Emergency Use Authorization (EUA). This EUA will remain  in effect (meaning this test can be used) for the duration of the COVID-19 declaration under Section 564(b)(1) of the Act, 21 U.S.C.section 360bbb-3(b)(1), unless the authorization is terminated  or revoked sooner.       Influenza A by PCR NEGATIVE  NEGATIVE Final   Influenza B by PCR NEGATIVE NEGATIVE Final    Comment: (NOTE) The Xpert Xpress SARS-CoV-2/FLU/RSV plus assay is intended as an aid in the diagnosis of influenza from Nasopharyngeal swab specimens and should not be used as a sole basis for treatment. Nasal washings and aspirates are unacceptable for Xpert Xpress SARS-CoV-2/FLU/RSV testing.  Fact Sheet for Patients: EntrepreneurPulse.com.au  Fact Sheet for Healthcare Providers: IncredibleEmployment.be  This test is not yet approved or cleared by the [place] FDA and has been authorized for detection and/or diagnosis of SARS-CoV-2 by FDA under an Emergency Use Authorization (EUA). This EUA will remain in effect (meaning this test can be used) for the duration of the COVID-19 declaration under Section 564(b)(1) of the Act, 21 U.S.C. section 360bbb-3(b)(1), unless the authorization is terminated or revoked.     Resp Syncytial Virus by PCR NEGATIVE NEGATIVE Final    Comment: (NOTE) Fact Sheet for Patients: EntrepreneurPulse.com.au  Fact Sheet for Healthcare Providers: IncredibleEmployment.be  This test is not yet approved or cleared by the [place] FDA and has been authorized for detection and/or diagnosis of SARS-CoV-2 by FDA under an Emergency Use Authorization (EUA). This EUA will remain in effect (meaning this test can be used) for the duration of the COVID-19 declaration under Section 564(b)(1) of the Act, 21 U.S.C. section 360bbb-3(b)(1), unless the authorization is terminated or revoked.  Performed at [facility], [address], [place], [place] [address]     Today   Subjective    [name] today has no headache,no chest abdominal pain,no new weakness tingling or numbness, feels much better wants to go home today.    Objective   Blood pressure 109/76, pulse 76, temperature 98 F (36.7 C), resp. rate 17, height 5'  6" (1.676 m), weight 78.9 kg, SpO2 94 %.   Intake/Output Summary (Last 24 hours) at 04/04/2022 1216 Last data filed at 04/04/2022 0900 Gross per 24 hour  Intake 290 ml  Output 1.9 ml  Net 288.1 ml    Exam  Awake Alert, No new F.N deficits,    [place].AT,PERRAL Supple Neck,   Symmetrical Chest wall movement, Good air movement bilaterally, CTAB RRR,No Gallops,   +ve B.Sounds, Abd Soft, Non tender,  No Cyanosis, Clubbing or edema    Data Review   Recent Labs  Lab 03/31/22 0325 04/01/22 0453 04/02/22 0345 04/02/22 1331 04/03/22 0307 04/03/22 0833 04/04/22 0252  WBC 5.1 4.7 5.1  --  5.3 5.8  7.1  HGB 10.1* 9.3* 8.7* 12.2 9.6* 9.4* 8.6*  HCT 33.5* 29.5* 27.5* 36.0 30.6* 31.3* 27.6*  PLT 175 167 176  --  188 215 201  MCV 99.7 97.7 96.8  --  98.1 100.6* 98.9  MCH 30.1 30.8 30.6  --  30.8 30.2 30.8  MCHC 30.1 31.5 31.6  --  31.4 30.0 31.2  RDW 16.4* 16.3* 16.1*  --  16.0* 16.1* 16.1*  LYMPHSABS 1.1 1.4 1.4  --  0.7  --  2.3  MONOABS 0.4 0.4 0.5  --  0.2  --  0.5  EOSABS 0.3 0.4 0.4  --  0.0  --  0.3  BASOSABS 0.1 0.1 0.0  --  0.0  --  0.1    Recent Labs  Lab 03/29/22 1422 03/29/22 1424 03/29/22 1424 03/30/22 0627 03/31/22 0325 03/31/22 0650 04/01/22 0453 04/02/22 0345 04/02/22 1331 04/03/22 0307 04/03/22 0833 04/04/22 0252  NA  --  131*   < > 135   < >  --  137 137 140 134* 138 132*  K  --  5.5*   < > 4.9   < >  --  4.7 4.3 4.6 7.2* 5.0 3.5  CL  --  92*   < > 98   < >  --  97* 97* 100 99 100 94*  CO2  --  25   < > 23   < >  --  25 29  --  23 24 22   ANIONGAP  --  14   < > 14   < >  --  15 11  --  12 14 16*  GLUCOSE  --  242*   < > 148*   < >  --  176* 176* 107* 340* 318* 356*  BUN  --  39*   < > 47*   < >  --  45* 25* 32* 37* 42* 27*  CREATININE  --  9.44*   < > 11.03*   < >  --  10.69* 7.01* 8.30* 9.00* 9.04* 5.96*  AST  --  15  --   --   --   --   --   --   --   --   --   --   ALT  --  11  --   --   --   --   --   --   --   --   --   --   ALKPHOS  --  59  --   --    --   --   --   --   --   --   --   --   BILITOT  --  0.6  --   --   --   --   --   --   --   --   --   --   ALBUMIN  --  3.9  --   --   --   --   --   --   --   --  3.0*  --   CRP 19.2*  --   --   --   --  14.3*  --   --   --   --   --   --   LATICACIDVEN  --  1.0  --   --   --   --   --   --   --   --   --   --  INR  --  1.1  --   --   --  1.1  --   --   --   --   --   --   HGBA1C  --   --   --  9.1*  --   --   --   --   --   --   --   --   BNP  --   --   --   --   --   --  381.3* 252.6*  --  256.2*  --  186.5*  CALCIUM  --  8.4*   < > 7.8*   < >  --  8.5* 7.8*  --  7.6* 8.3* 7.8*   < > = values in this interval not displayed.     Total Time in preparing paper work, data evaluation and todays exam - 35 minutes  Signature  -    Lala Lund M.D on 04/04/2022 at 12:16 PM   -  To page go to [email or web address]

## 2022-04-04 NOTE — Progress Notes (Cosign Needed)
Durable Medical Equipment  (From admission, onward)           Start     Ordered   04/04/22 1129  For home use only DME Walker rolling  Once       Question Answer Comment  Walker: With [place]   Patient needs a walker to treat with the following condition Decreased functional mobility and endurance      04/04/22 1129   04/04/22 1129  For home use only DME Bedside commode  Once       Comments: Patient does not have a bathroom in her room and mobility is limited at this time  Question:  Patient needs a bedside commode to treat with the following condition  Answer:  Decreased functional mobility and endurance   04/04/22 1129   04/04/22 0928  For home use only DME Cane  Once        04/04/22 9290

## 2022-04-04 NOTE — Discharge Instructions (Addendum)
Please keep your Left foot dry at all times.  Follow with Primary MD [name], MD in 7 days   Get CBC, CMP, 2 view Chest X ray -  checked next visit with your primary MD    Activity: touchdown weightbearing on the left with Full fall precautions use walker/cane & assistance as needed  Disposition Home    Diet: Renal-low carbohydrate diet with 1.5 L fluid restriction per day, check CBGs q. ACH S.     Special Instructions: If you have smoked or chewed Tobacco  in the last 2 yrs please stop smoking, stop any regular Alcohol  and or any Recreational drug use.  On your next visit with your primary care physician please Get Medicines reviewed and adjusted.  Please request your Prim.MD to go over all Hospital Tests and Procedure/Radiological results at the follow up, please get all Hospital records sent to your Prim MD by signing hospital release before you go home.  If you experience worsening of your admission symptoms, develop shortness of breath, life threatening emergency, suicidal or homicidal thoughts you must seek medical attention immediately by calling 911 or calling your MD immediately  if symptoms less severe.  You Must read complete instructions/literature along with all the possible adverse reactions/side effects for all the Medicines you take and that have been prescribed to you. Take any new Medicines after you have completely understood and accpet all the possible adverse reactions/side effects.

## 2022-04-04 NOTE — Progress Notes (Signed)
[facility] Progress Note   Subjective:   Patient seen and examined at bedside.  Tired this AM.  Denies pain, SOB, CP and n/v/d.  Tolerated dialysis well yesterday.   Objective Vitals:   04/03/22 1622 04/03/22 2018 04/04/22 0358 04/04/22 0800  BP: 106/78 (!) 141/98 98/64 109/76  Pulse: 97 99 85 76  Resp: 17 17 18 17   Temp: 98.4 F (36.9 C) 98.6 F (37 C) 97.6 F (36.4 C) 98 F (36.7 C)  TempSrc: Oral Oral Oral   SpO2: 98% 98% 93% 94%  Weight:      Height:       Physical Exam General:well appearing female in NAD Heart:RRR, no mrg Lungs:CTAB, nml WOB on RA Abdomen:soft, NTND Extremities:no LE edema, L foot dressed Dialysis Access:  LU AVG +b/t  Filed Weights   04/01/22 0816 04/01/22 1212 04/02/22 1311  Weight: 85.1 kg 83 kg 78.9 kg    Intake/Output Summary (Last 24 hours) at 04/04/2022 0853 Last data filed at 04/04/2022 0300 Gross per 24 hour  Intake 210 ml  Output 1.9 ml  Net 208.1 ml    Additional Objective Labs: Basic Metabolic Panel: Recent Labs  Lab 04/03/22 0307 04/03/22 0833 04/04/22 0252  NA 134* 138 132*  K 7.2* 5.0 3.5  CL 99 100 94*  CO2 23 24 22   GLUCOSE 340* 318* 356*  BUN 37* 42* 27*  CREATININE 9.00* 9.04* 5.96*  CALCIUM 7.6* 8.3* 7.8*  PHOS  --  5.5*  --    Liver Function Tests: Recent Labs  Lab 03/29/22 1424 04/03/22 0833  AST 15  --   ALT 11  --   ALKPHOS 59  --   BILITOT 0.6  --   PROT 9.4*  --   ALBUMIN 3.9 3.0*   CBC: Recent Labs  Lab 04/01/22 0453 04/02/22 0345 04/02/22 1331 04/03/22 0307 04/03/22 0833 04/04/22 0252  WBC 4.7 5.1  --  5.3 5.8 7.1  NEUTROABS 2.4 2.7  --  4.2  --  4.0  HGB 9.3* 8.7*   < > 9.6* 9.4* 8.6*  HCT 29.5* 27.5*   < > 30.6* 31.3* 27.6*  MCV 97.7 96.8  --  98.1 100.6* 98.9  PLT 167 176  --  188 215 201   < > = values in this interval not displayed.   Blood Culture    Component Value Date/Time   SDES BLOOD RIGHT HAND 03/29/2022 1424   SDES BLOOD RIGHT ARM 03/29/2022 1424    SPECREQUEST  03/29/2022 1424    BOTTLES DRAWN AEROBIC AND ANAEROBIC Blood Culture adequate volume   SPECREQUEST  03/29/2022 1424    BOTTLES DRAWN AEROBIC AND ANAEROBIC Blood Culture adequate volume   CULT  03/29/2022 1424    NO GROWTH 5 DAYS Performed at [facility], [address] [place], [place] [address]    CULT  03/29/2022 1424    NO GROWTH 5 DAYS Performed at [facility], [address] [place], [place] [address]    REPTSTATUS 04/03/2022 FINAL 03/29/2022 1424   REPTSTATUS 04/03/2022 FINAL 03/29/2022 1424   CBG: Recent Labs  Lab 04/03/22 1711 04/03/22 2015 04/04/22 0014 04/04/22 0353 04/04/22 0759  GLUCAP 373* 387* 380* 328* 201*   Medications:  piperacillin-tazobactam (ZOSYN)  IV 2.25 g (04/04/22 0522)   vancomycin Stopped (04/03/22 1248)    vitamin C  1,000 mg Oral Daily   calcium acetate  1,334 mg Oral TID WC   calcium carbonate (dosed in mg elemental calcium)  500 mg  of elemental calcium Oral Daily   darbepoetin (ARANESP) injection - DIALYSIS  60 mcg Subcutaneous Q Fri-1800   docusate sodium  100 mg Oral Daily   doxercalciferol  2 mcg Intravenous Q T,Th,Sa-HD   famotidine  10 mg Oral Daily   gabapentin  600 mg Oral TID   heparin  5,000 Units Subcutaneous Q8H   insulin aspart  0-9 Units Subcutaneous TID WC   insulin glargine-yfgn  10 Units Subcutaneous Daily   nutrition supplement (JUVEN)  1 packet Oral BID BM   pantoprazole  40 mg Oral Daily   saccharomyces boulardii  250 mg Oral BID   sevelamer carbonate  2.4 g Oral TID WC   simvastatin  20 mg Oral QHS   zinc sulfate  220 mg Oral Daily    Dialysis Orders: AF-  TTS 4 hours  EDW 79. HD Bath 2K/3calc, Dialyzer 180, Heparin yes-  4000 then 2500 mid. Access AVG-  16 gauge - 300 BFR.  Last HD Sat-  left out 80.6-  likely needs EDW up-  last hgb 10.8, phos 6.2, pth 89  hect 2 q tx, mircera 3   Assessment/Plan: 1. Left foot ulcer - Had trauma to foot. MRI with suspicion for osteo; IV vanc/Zosyn. Per  primary/podiatry. L great toe amp 1/19 2. ESRD - HD TTS - uses 16g needles.  Continue HD per regular schedule.  Urgent HD yesterday d/t hyperkalemia, K 3.5 today. 3. HTN/volume-  BP/volume stable. On prn metoprolol.  UF as tolerated.  4. Anemia-  Hb 8.6.  Start ESA.  5. MBD of CKD -  Calcium a little low, use increase Ca bath. Phos at high end of goal. Continue hectorol/Renvela binder 6. Nutrition - Renal diet with fluid restriction  7. Diabetes - on insulin.  8. Hx seizures/pseudoseizures - per PMD.  Jen Mow, PA-C [facility] 04/04/2022,8:53 AM  LOS: 6 days
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
## 2022-04-04 NOTE — TOC Transition Note (Signed)
Transition of Care [facility]) - CM/SW Discharge Note   Patient Details  Name: [name] MRN: [id number] Date of Birth: 1986/01/19  [facility] CM/SW Contact:  Bartholomew Crews, RN Phone Number: [phone] 04/04/2022, 12:33 PM   Clinical Narrative:     Patient to transition home today. DME RW/BSC delivered to patient room by [employer]. HH PT/OT referral accepted by [facility]. Patient arranging transportation home. No further TOC needs identified at this time.   Final next level of care: [place] Barriers to Discharge: No Barriers Identified   Patient Goals and CMS Choice      Discharge Placement                         Discharge Plan and Services Additional resources added to the After Visit Summary for                  DME Arranged: 3-N-1, Walker rolling DME Agency: [employer] Date DME Agency Contacted: 04/04/22 Time DME Agency Contacted: 73 Representative spoke with at DME Agency: [place]: PT, OT [place] Agency: [place] Date [place]: 04/04/22 Time [place]: 1233 Representative spoke with at [place]: [place] ([place]) Interventions SDOH Screenings   Food Insecurity: No Food Insecurity (03/30/2022)  Housing: Low Risk  (03/30/2022)  Transportation Needs: No Transportation Needs (03/30/2022)  Utilities: Not At Risk (03/30/2022)  Depression (PHQ2-9): Low Risk  (04/19/2018)  Financial Resource Strain: Low Risk  (10/24/2017)  Physical Activity: Sufficiently Active (10/24/2017)  Social Connections: Somewhat Isolated (10/24/2017)  Stress: No Stress Concern Present (10/24/2017)  Tobacco Use: Low Risk  (04/03/2022)     Readmission Risk Interventions     No data to display

## 2022-04-04 NOTE — Progress Notes (Signed)
This RN received call from ED requesting if visitor could be allowed to 6N. This Probation officer mentioned to Network engineer that d/t it being after hours, visitor may not be allowed on unit and also explained to patient.

## 2022-04-05 ENCOUNTER — Telehealth: Payer: Self-pay | Admitting: Nephrology

## 2022-04-05 LAB — GLUCOSE, CAPILLARY: Glucose-Capillary: 300 mg/dL — ABNORMAL HIGH (ref 70–99)

## 2022-04-05 NOTE — Progress Notes (Signed)
Late Entry Note:  Pt was d/c to home yesterday. Contacted [facility] SW to advise clinic of pt's d/c and that pt should resume care tomorrow.   Melven Sartorius Renal Navigator [phone]

## 2022-04-05 NOTE — Telephone Encounter (Signed)
Transition of care contact from inpatient facility  Date of discharge: 04/04/22 Date of contact: 04/05/22 Method: Phone Spoke to: Patient  Patient contacted to discuss transition of care from recent inpatient hospitalization. Patient was admitted to [facility] from 1/15 -04/04/22 with discharge diagnosis of diabetic foot ulcer s/p left great toe amputation.   Medication changes were reviewed.  Patient will follow up with his/her outpatient HD unit on: Tuesday 04/06/22

## 2022-04-09 ENCOUNTER — Ambulatory Visit (INDEPENDENT_AMBULATORY_CARE_PROVIDER_SITE_OTHER): Payer: 59 | Admitting: Family

## 2022-04-09 DIAGNOSIS — S98112A Complete traumatic amputation of left great toe, initial encounter: Secondary | ICD-10-CM

## 2022-04-09 DIAGNOSIS — Z89422 Acquired absence of other left toe(s): Secondary | ICD-10-CM

## 2022-04-09 MED ORDER — HYDROCODONE-ACETAMINOPHEN 5-325 MG PO TABS: 1.0000 | ORAL_TABLET | Freq: Four times a day (QID) | ORAL | 0 refills | Status: DC | PRN

## 2022-04-09 MED ORDER — DOXYCYCLINE HYCLATE 100 MG PO CAPS: 100.0000 mg | ORAL_CAPSULE | Freq: Two times a day (BID) | ORAL | 0 refills | Status: DC

## 2022-04-09 NOTE — Progress Notes (Unsigned)
Post-Op Visit Note   Patient: [name]           Date of Birth: Jul 30, 1985           MRN: [id number] Visit Date: 04/09/2022 PCP: [name], MD  Chief Complaint:  Chief Complaint  Patient presents with   Left Foot - Routine Post Op    04/02/2022 left great toe amputation     HPI:  HPI The patient is a 37 year old woman seen status post left great toe amputation on January 19 today she is in a postop shoe with a walker she is complaining of some pain when she ambulates she is also concerned that she might have lost some of her doxycycline prescription she thinks her dad possibly took this or threw it away she would like to complete the course.    Ortho Exam On examination of the left foot the great toe amputation site is well-approximated with sutures.  No gaping drainage erythema  Visit Diagnoses: No diagnosis found.  Plan: Begin daily Dial soap cleansing dry dressings advance weightbearing as tolerated.  Follow-up in 10 days for suture removal  Follow-Up Instructions: No follow-ups on file.   Imaging: No results found.  Orders:  No orders of the defined types were placed in this encounter.  Meds ordered this encounter  Medications   doxycycline (VIBRAMYCIN) 100 MG capsule    Sig: Take 1 capsule (100 mg total) by mouth 2 (two) times daily.    Dispense:  14 capsule    Refill:  0     PMFS History: Patient Active Problem List   Diagnosis Date Noted   Open wound of left foot 04/02/2022   Osteomyelitis of great toe of left foot ([place]) 03/30/2022   Diabetic foot ulcer ([place]) 03/29/2022   Abdominal pain, epigastric    Abnormal CT scan, colon    Hematemesis with nausea 12/28/2021   COVID-19 virus infection 12/27/2021   Liver lesion 12/27/2021   GI bleeding 12/26/2021   Esophageal dysphagia    Esophageal stricture    Seizures ([place]) 10/30/2020   [place] grade D esophagitis 08/09/2020   DKA, type 2 ([place]) 07/31/2020   Polyneuropathy associated with  underlying disease ([place]) 04/29/2020   Renal osteodystrophy 04/22/2020   Hiatal hernia 04/22/2020   Overweight (BMI 25.0-29.9) 04/22/2020   Uncontrolled type 1 diabetes mellitus with hyperglycemia ([place]) 04/20/2020   GERD (gastroesophageal reflux disease) 04/20/2020   Wide-complex tachycardia    ESRD on hemodialysis ([place]) 05/01/2018   Iron deficiency anemia, unspecified 96/22/2979   Complication of vascular dialysis catheter 03/27/2018   Kidney transplant failure 03/27/2018   Renal sclerosis, unspecified 03/27/2018   Hyperlipidemia 89/21/1941   Metabolic acidosis 74/10/1446   Incontinence of bowel 02/03/2018   Chronic pain 11/11/2017   Chronic cholecystitis 06/29/2017   Diabetes mellitus type 1 ([place]) 05/25/2015   Nausea and vomiting    Renal transplant recipient    Immunosuppressed status ([place])    ESRD (end stage renal disease) ([place]) 09/30/2014   Sepsis ([place]) 06/24/2014   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   Sleep-wake schedule disorder, 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   Chronic kidney disease 01/04/2010   OVARIAN FAILURE, PREMATURE 03/11/2009   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   OBESITY 09/25/2007   Anemia due to chronic kidney disease 09/25/2007   LEARNING DISABILITY 09/25/2007   Essential hypertension, benign 09/25/2007   Past Medical History:  Diagnosis Date   Anemia    Blood transfusion without reported diagnosis  Cellulitis of left leg 03/01/2018   Chronic kidney disease    kidney transplant 07   Diabetes mellitus    Pt reports diagnosis in June 2011, Type 2   Diabetes mellitus without complication ([place])    Esophageal obstruction due to food impaction    GERD (gastroesophageal reflux disease)    Hyperlipidemia    Hypertension    Intra-abdominal abscess ([place]) 10/28/2018   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   LEARNING DISABILITY 09/25/2007   Qualifier: Diagnosis of  By: Deborra Medina MD, Talia     Nausea and  vomiting    Prolonged Q-T interval on ECG    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   Pyelonephritis 06/23/2014   Renal and perinephric abscess 11/01/2018   Renal disorder    Seasonal allergies    Seizures ([place])    UTI (urinary tract infection) 01/09/2015   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   Qualifier: Diagnosis of  By: Carlena Sax  MD, Colletta [place]      Family History  Problem Relation Age of Onset   Arthritis Mother    Hypertension Mother    Aneurysm Mother        died of brain aneurysm   CAD Father        Has 3 stents   Diabetes Father        borderline   Early death Brother        Died in war   Colon cancer Neg Hx    Esophageal cancer Neg Hx    Rectal cancer Neg Hx    Stomach cancer Neg Hx     Past Surgical History:  Procedure Laterality Date   AMPUTATION Left 04/02/2022   Procedure: AMPUTATION LEFT GREAT TOE;  Surgeon: Newt Minion, MD;  Location: [place];  Service: Orthopedics;  Laterality: Left;   ARTERIOVENOUS GRAFT PLACEMENT Bilateral    "neither work" (10/24/2017)   AV FISTULA PLACEMENT Left 10/26/2018   Procedure: CREATION OF ARTERIOVENOUS FISTULA  LEFT ARM;  Surgeon: [name], MD;  Location: [place];  Service: Vascular;  Laterality: Left;   AV FISTULA PLACEMENT Left 05/23/2020   Procedure: LEFT ARM ARTERIOVENOUS (AV) FISTULA CREATION;  Surgeon: [name], MD;  Location: [place];  Service: Vascular;  Laterality: Left;   BALLOON DILATION N/A 01/19/2021   Procedure: Larrie Kass DILATION;  Surgeon: Gatha Mayer, MD;  [facility];  Service: Endoscopy;  Laterality: N/A;   BALLOON DILATION N/A 02/10/2021   Procedure: BALLOON DILATION;  Surgeon: Irene Shipper, MD;  [facility];  Service: Endoscopy;  Laterality: N/A;   BALLOON DILATION N/A 02/19/2021   Procedure: BALLOON DILATION;  Surgeon: Sharyn Creamer, MD;  [facility];  Service: Gastroenterology;  Laterality: N/A;   BASCILIC VEIN TRANSPOSITION Left 12/21/2018   Procedure: Left arm BASILIC VEIN  TRANSPOSITION SECOND STAGE;  Surgeon: [name], MD;  Location: [place];  Service: Vascular;  Laterality: Left;   BIOPSY  12/29/2021   Procedure: BIOPSY;  Surgeon: Ladene Artist, MD;  Location: [place];  Service: Gastroenterology;;   CHOLECYSTECTOMY N/A 06/30/2017   Procedure: LAPAROSCOPIC CHOLECYSTECTOMY WITH INTRAOPERATIVE CHOLANGIOGRAM;  Surgeon: Excell Seltzer, MD;  [facility];  Service: General;  Laterality: N/A;   ESOPHAGOGASTRODUODENOSCOPY (EGD) WITH PROPOFOL N/A 07/04/2017   Procedure: ESOPHAGOGASTRODUODENOSCOPY (EGD) WITH PROPOFOL;  Surgeon: Clarene Essex, MD;  [facility];  Service: Endoscopy;  Laterality: N/A;   ESOPHAGOGASTRODUODENOSCOPY (EGD) WITH PROPOFOL N/A 08/10/2020   Procedure: ESOPHAGOGASTRODUODENOSCOPY (EGD) WITH PROPOFOL;  Surgeon: Wilfrid Lund  L III, MD;  Location: [place];  Service: Gastroenterology;  Laterality: N/A;   ESOPHAGOGASTRODUODENOSCOPY (EGD) WITH PROPOFOL N/A 01/19/2021   Procedure: ESOPHAGOGASTRODUODENOSCOPY (EGD) WITH PROPOFOL;  Surgeon: Gatha Mayer, MD;  [facility];  Service: Endoscopy;  Laterality: N/A;  WITH FLUOROSCOPY AND DILATION   ESOPHAGOGASTRODUODENOSCOPY (EGD) WITH PROPOFOL N/A 02/03/2021   Procedure: ESOPHAGOGASTRODUODENOSCOPY (EGD) WITH PROPOFOL;  Surgeon: Gatha Mayer, MD;  [facility];  Service: Endoscopy;  Laterality: N/A;   ESOPHAGOGASTRODUODENOSCOPY (EGD) WITH PROPOFOL N/A 02/10/2021   Procedure: ESOPHAGOGASTRODUODENOSCOPY (EGD) WITH PROPOFOL;  Surgeon: Irene Shipper, MD;  [facility];  Service: Endoscopy;  Laterality: N/A;  Balloon Dilation   ESOPHAGOGASTRODUODENOSCOPY (EGD) WITH PROPOFOL N/A 02/19/2021   Procedure: ESOPHAGOGASTRODUODENOSCOPY (EGD) WITH PROPOFOL;  Surgeon: Sharyn Creamer, MD;  [facility];  Service: Gastroenterology;  Laterality: N/A;   ESOPHAGOGASTRODUODENOSCOPY (EGD) WITH PROPOFOL N/A 03/12/2021   Procedure: ESOPHAGOGASTRODUODENOSCOPY  (EGD) WITH PROPOFOL;  Surgeon: Lavena Bullion, DO;  [facility];  Service: Gastroenterology;  Laterality: N/A;   ESOPHAGOGASTRODUODENOSCOPY (EGD) WITH PROPOFOL N/A 12/29/2021   Procedure: ESOPHAGOGASTRODUODENOSCOPY (EGD) WITH PROPOFOL;  Surgeon: Ladene Artist, MD;  Location: [place];  Service: Gastroenterology;  Laterality: N/A;   FLEXIBLE SIGMOIDOSCOPY N/A 12/29/2021   Procedure: FLEXIBLE SIGMOIDOSCOPY;  Surgeon: Ladene Artist, MD;  Location: [place];  Service: Gastroenterology;  Laterality: N/A;   FOREIGN BODY REMOVAL N/A 02/03/2021   Procedure: FOREIGN BODY REMOVAL;  Surgeon: Gatha Mayer, MD;  [facility];  Service: Endoscopy;  Laterality: N/A;   INSERTION OF DIALYSIS CATHETER N/A 03/20/2018   Procedure: INSERTION OF TUNNELED DIALYSIS CATHETER - RIGHT INTERANL JUGULAR PLACEMENT;  Surgeon: Angelia Mould, MD;  Location: [place];  Service: Vascular;  Laterality: N/A;   IR FLUORO GUIDE CV LINE RIGHT  04/18/2020   IR GUIDED DRAIN W CATHETER PLACEMENT  10/28/2018   KIDNEY TRANSPLANT  2007   KIDNEY TRANSPLANT Right    PARATHYROIDECTOMY  ?2012   "3/4 removed" (10/24/2017)   RENAL BIOPSY Bilateral 2003   REVISON OF ARTERIOVENOUS FISTULA Left 09/24/2020   Procedure: LEFT UPPER ARM ARTERIOVENOUS GRAFT CREATION;  Surgeon: [name], MD;  Location: [place];  Service: Vascular;  Laterality: Left;   UPPER EXTREMITY VENOGRAPHY Bilateral 10/19/2018   Procedure: UPPER EXTREMITY VENOGRAPHY;  Surgeon: [name], MD;  Location: [place] [facility];  Service: Cardiovascular;  Laterality: Bilateral;  Bilateral    UPPER EXTREMITY VENOGRAPHY Left 09/04/2020   Procedure: UPPER EXTREMITY VENOGRAPHY - Left Upper;  Surgeon: [name], MD;  Location: [place] [facility];  Service: Cardiovascular;  Laterality: Left;   Social History   Occupational History   Occupation: Scientist, 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

## 2022-04-13 ENCOUNTER — Encounter: Payer: Self-pay | Admitting: Family

## 2022-04-15 ENCOUNTER — Ambulatory Visit (INDEPENDENT_AMBULATORY_CARE_PROVIDER_SITE_OTHER): Payer: 59 | Admitting: Orthopedic Surgery

## 2022-04-15 DIAGNOSIS — Z89412 Acquired absence of left great toe: Secondary | ICD-10-CM

## 2022-04-15 DIAGNOSIS — S98112A Complete traumatic amputation of left great toe, initial encounter: Secondary | ICD-10-CM

## 2022-04-16 ENCOUNTER — Encounter: Payer: Self-pay | Admitting: Orthopedic Surgery

## 2022-04-16 ENCOUNTER — Encounter: Payer: 59 | Admitting: Family

## 2022-04-16 NOTE — Progress Notes (Signed)
Office Visit Note   Patient: [name]           Date of Birth: [birth year]           MRN: [id number] Visit Date: 04/15/2022              Requested by: [name], MD [address] [place],  [place] [address] PCP: [name], MD  Chief Complaint  Patient presents with   Left Foot - Routine Post Op    04/02/2022 left GT amputation       HPI: Patient is a 37 year old gentleman who is 2 weeks status post left great toe amputation.  Patient received dialysis today.  Assessment & Plan: Visit Diagnoses:  1. Amputation of left great toe ([place])     Plan: Recommended using the postoperative shoe we will provide a new postoperative shoe.  Follow-up in 1 week to remove the sutures.  Recommended continuing protein supplements.  Follow-Up Instructions: Return in about 1 week (around 04/22/2022).   Ortho Exam  Patient is alert, oriented, no adenopathy, well-dressed, normal affect, normal respiratory effort. Examination patient is walking in bedroom slippers, the wound has not completely healed.  Imaging: No results found. No images are attached to the encounter.  Labs: Lab Results  Component Value Date   HGBA1C 9.1 (H) 03/30/2022   HGBA1C 9.2 (H) 12/28/2021   HGBA1C 8.0 (H) 05/30/2021   ESRSEDRATE 88 (H) 03/31/2022   ESRSEDRATE 100 (H) 03/29/2022   CRP 14.3 (H) 03/31/2022   CRP 19.2 (H) 03/29/2022   CRP 1.0 (H) 01/01/2022   REPTSTATUS 04/03/2022 FINAL 03/29/2022   REPTSTATUS 04/03/2022 FINAL 03/29/2022   GRAMSTAIN  10/28/2018    ABUNDANT WBC PRESENT, PREDOMINANTLY PMN FEW GRAM POSITIVE COCCI    CULT  03/29/2022    NO GROWTH 5 DAYS Performed at [facility] [address], [place], [place] [address]    CULT  03/29/2022    NO GROWTH 5 DAYS Performed at [facility], [address], [place], [place] [address]    [place] 06/09/2017     Lab Results  Component Value Date   ALBUMIN 3.0 (L) 04/03/2022   ALBUMIN 3.9 03/29/2022   ALBUMIN  3.1 (L) 01/01/2022    Lab Results  Component Value Date   MG 1.9 12/28/2021   MG 2.2 09/28/2021   MG 2.0 06/13/2021   Lab Results  Component Value Date   VD25OH 17 (L) 03/23/2022   VD25OH 13 (L) 04/17/2020   VD25OH 8.9 (L) 03/20/2018    No results found for: "PREALBUMIN"    Latest Ref Rng & Units 04/04/2022    2:52 AM 04/03/2022    8:33 AM 04/03/2022    3:07 AM  CBC EXTENDED  WBC 4.0 - 10.5 K/uL 7.1  5.8  5.3   RBC 3.87 - 5.11 MIL/uL 2.79  3.11  3.12   Hemoglobin 12.0 - 15.0 g/dL 8.6  9.4  9.6   HCT 36.0 - 46.0 % 27.6  31.3  30.6   Platelets 150 - 400 K/uL 201  215  188   NEUT# 1.7 - 7.7 K/uL 4.0   4.2   Lymph# 0.7 - 4.0 K/uL 2.3   0.7      There is no height or weight on file to calculate BMI.  Orders:  No orders of the defined types were placed in this encounter.  No orders of the defined types were placed in this encounter.    Procedures: No procedures performed  Clinical Data: No additional findings.  ROS:  All other systems negative, except as noted in the HPI. Review of Systems  Objective: Vital Signs: There were no vitals taken for this visit.  Specialty Comments:  No specialty comments available.  PMFS History: Patient Active Problem List   Diagnosis Date Noted   Open wound of left foot 04/02/2022   Osteomyelitis of great toe of left foot ([place]) 03/30/2022   Diabetic foot ulcer ([place]) 03/29/2022   Abdominal pain, epigastric    Abnormal CT scan, colon    Hematemesis with nausea 12/28/2021   COVID-19 virus infection 12/27/2021   Liver lesion 12/27/2021   GI bleeding 12/26/2021   Esophageal dysphagia    Esophageal stricture    Seizures ([place]) 10/30/2020   [place] grade D esophagitis 08/09/2020   DKA, type 2 ([place]) 07/31/2020   Polyneuropathy associated with underlying disease ([place]) 04/29/2020   Renal osteodystrophy 04/22/2020   Hiatal hernia 04/22/2020   Overweight (BMI 25.0-29.9) 04/22/2020   Uncontrolled type 1 diabetes mellitus with  hyperglycemia ([place]) 04/20/2020   GERD (gastroesophageal reflux disease) 04/20/2020   Wide-complex tachycardia    ESRD on hemodialysis ([place]) 05/01/2018   Iron deficiency anemia, unspecified 30/16/0109   Complication of vascular dialysis catheter 03/27/2018   Kidney transplant failure 03/27/2018   Renal sclerosis, unspecified 03/27/2018   Hyperlipidemia 32/35/5732   Metabolic acidosis 20/25/4270   Incontinence of bowel 02/03/2018   Chronic pain 11/11/2017   Chronic cholecystitis 06/29/2017   Diabetes mellitus type 1 ([place]) 05/25/2015   Nausea and vomiting    Renal transplant recipient    Immunosuppressed status ([place])    ESRD (end stage renal disease) ([place]) 09/30/2014   Sepsis ([place]) 06/24/2014   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   Sleep-wake schedule disorder, 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   Chronic kidney disease 01/04/2010   OVARIAN FAILURE, PREMATURE 03/11/2009   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   OBESITY 09/25/2007   Anemia due to chronic kidney disease 09/25/2007   LEARNING DISABILITY 09/25/2007   Essential hypertension, benign 09/25/2007   Past Medical History:  Diagnosis Date   Anemia    Blood transfusion without reported diagnosis    Cellulitis of left leg 03/01/2018   Chronic kidney disease    kidney transplant 07   Diabetes mellitus    Pt reports diagnosis in June 2011, Type 2   Diabetes mellitus without complication ([place])    Esophageal obstruction due to food impaction    GERD (gastroesophageal reflux disease)    Hyperlipidemia    Hypertension    Intra-abdominal abscess ([place]) 10/28/2018   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   LEARNING DISABILITY 09/25/2007   Qualifier: Diagnosis of  By: Deborra Medina MD, Talia     Nausea and vomiting    Prolonged Q-T interval on ECG    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   Pyelonephritis 06/23/2014   Renal and perinephric abscess 11/01/2018   Renal disorder    Seasonal allergies     Seizures ([place])    UTI (urinary tract infection) 01/09/2015   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   Qualifier: Diagnosis of  By: Carlena Sax  MD, Colletta [place]      Family History  Problem Relation Age of Onset   Arthritis Mother    Hypertension Mother    Aneurysm Mother        died of brain aneurysm   CAD Father        Has 3 stents   Diabetes Father  borderline   Early death Brother        Died in war   Colon cancer Neg Hx    Esophageal cancer Neg Hx    Rectal cancer Neg Hx    Stomach cancer Neg Hx     Past Surgical History:  Procedure Laterality Date   AMPUTATION Left 04/02/2022   Procedure: AMPUTATION LEFT GREAT TOE;  Surgeon: Newt Minion, MD;  Location: [facility];  Service: Orthopedics;  Laterality: Left;   ARTERIOVENOUS GRAFT PLACEMENT Bilateral    "neither work" (10/24/2017)   AV FISTULA PLACEMENT Left 10/26/2018   Procedure: CREATION OF ARTERIOVENOUS FISTULA  LEFT ARM;  Surgeon: [name], MD;  Location: [place];  Service: Vascular;  Laterality: Left;   AV FISTULA PLACEMENT Left 05/23/2020   Procedure: LEFT ARM ARTERIOVENOUS (AV) FISTULA CREATION;  Surgeon: [name], MD;  Location: [place];  Service: Vascular;  Laterality: Left;   BALLOON DILATION N/A 01/19/2021   Procedure: Larrie Kass DILATION;  Surgeon: Gatha Mayer, MD;  [facility];  Service: Endoscopy;  Laterality: N/A;   BALLOON DILATION N/A 02/10/2021   Procedure: BALLOON DILATION;  Surgeon: Irene Shipper, MD;  [facility];  Service: Endoscopy;  Laterality: N/A;   BALLOON DILATION N/A 02/19/2021   Procedure: BALLOON DILATION;  Surgeon: Sharyn Creamer, MD;  [facility];  Service: Gastroenterology;  Laterality: N/A;   BASCILIC VEIN TRANSPOSITION Left 12/21/2018   Procedure: Left arm BASILIC VEIN TRANSPOSITION SECOND STAGE;  Surgeon: [name], MD;  Location: [place] [facility];  Service: Vascular;  Laterality: Left;   BIOPSY  12/29/2021   Procedure: BIOPSY;  Surgeon: Ladene Artist, MD;   Location: [place];  Service: Gastroenterology;;   CHOLECYSTECTOMY N/A 06/30/2017   Procedure: LAPAROSCOPIC CHOLECYSTECTOMY WITH INTRAOPERATIVE CHOLANGIOGRAM;  Surgeon: Excell Seltzer, MD;  [facility];  Service: General;  Laterality: N/A;   ESOPHAGOGASTRODUODENOSCOPY (EGD) WITH PROPOFOL N/A 07/04/2017   Procedure: ESOPHAGOGASTRODUODENOSCOPY (EGD) WITH PROPOFOL;  Surgeon: Clarene Essex, MD;  [facility];  Service: Endoscopy;  Laterality: N/A;   ESOPHAGOGASTRODUODENOSCOPY (EGD) WITH PROPOFOL N/A 08/10/2020   Procedure: ESOPHAGOGASTRODUODENOSCOPY (EGD) WITH PROPOFOL;  Surgeon: Doran Stabler, MD;  Location: [facility];  Service: Gastroenterology;  Laterality: N/A;   ESOPHAGOGASTRODUODENOSCOPY (EGD) WITH PROPOFOL N/A 01/19/2021   Procedure: ESOPHAGOGASTRODUODENOSCOPY (EGD) WITH PROPOFOL;  Surgeon: Gatha Mayer, MD;  [facility];  Service: Endoscopy;  Laterality: N/A;  WITH FLUOROSCOPY AND DILATION   ESOPHAGOGASTRODUODENOSCOPY (EGD) WITH PROPOFOL N/A 02/03/2021   Procedure: ESOPHAGOGASTRODUODENOSCOPY (EGD) WITH PROPOFOL;  Surgeon: Gatha Mayer, MD;  [facility];  Service: Endoscopy;  Laterality: N/A;   ESOPHAGOGASTRODUODENOSCOPY (EGD) WITH PROPOFOL N/A 02/10/2021   Procedure: ESOPHAGOGASTRODUODENOSCOPY (EGD) WITH PROPOFOL;  Surgeon: Irene Shipper, MD;  [facility];  Service: Endoscopy;  Laterality: N/A;  Balloon Dilation   ESOPHAGOGASTRODUODENOSCOPY (EGD) WITH PROPOFOL N/A 02/19/2021   Procedure: ESOPHAGOGASTRODUODENOSCOPY (EGD) WITH PROPOFOL;  Surgeon: Sharyn Creamer, MD;  [facility];  Service: Gastroenterology;  Laterality: N/A;   ESOPHAGOGASTRODUODENOSCOPY (EGD) WITH PROPOFOL N/A 03/12/2021   Procedure: ESOPHAGOGASTRODUODENOSCOPY (EGD) WITH PROPOFOL;  Surgeon: Lavena Bullion, DO;  [facility];  Service: Gastroenterology;  Laterality: N/A;   ESOPHAGOGASTRODUODENOSCOPY (EGD) WITH PROPOFOL N/A 12/29/2021    Procedure: ESOPHAGOGASTRODUODENOSCOPY (EGD) WITH PROPOFOL;  Surgeon: Ladene Artist, MD;  Location: [facility];  Service: Gastroenterology;  Laterality: N/A;   FLEXIBLE SIGMOIDOSCOPY N/A 12/29/2021   Procedure: FLEXIBLE SIGMOIDOSCOPY;  Surgeon: Ladene Artist, MD;  Location:  [facility];  Service: Gastroenterology;  Laterality: N/A;   FOREIGN BODY REMOVAL N/A 02/03/2021   Procedure: FOREIGN BODY REMOVAL;  Surgeon: Gatha Mayer, MD;  [facility];  Service: Endoscopy;  Laterality: N/A;   INSERTION OF DIALYSIS CATHETER N/A 03/20/2018   Procedure: INSERTION OF TUNNELED DIALYSIS CATHETER - RIGHT INTERANL JUGULAR PLACEMENT;  Surgeon: Angelia Mould, MD;  Location: [facility];  Service: Vascular;  Laterality: N/A;   IR FLUORO GUIDE CV LINE RIGHT  04/18/2020   IR GUIDED DRAIN W CATHETER PLACEMENT  10/28/2018   KIDNEY TRANSPLANT  2007   KIDNEY TRANSPLANT Right    PARATHYROIDECTOMY  ?2012   "3/4 removed" (10/24/2017)   RENAL BIOPSY Bilateral 2003   REVISON OF ARTERIOVENOUS FISTULA Left 09/24/2020   Procedure: LEFT UPPER ARM ARTERIOVENOUS GRAFT CREATION;  Surgeon: [name], MD;  Location: [facility];  Service: Vascular;  Laterality: Left;   UPPER EXTREMITY VENOGRAPHY Bilateral 10/19/2018   Procedure: UPPER EXTREMITY VENOGRAPHY;  Surgeon: [name], MD;  Location: [facility];  Service: Cardiovascular;  Laterality: Bilateral;  Bilateral    UPPER EXTREMITY VENOGRAPHY Left 09/04/2020   Procedure: UPPER EXTREMITY VENOGRAPHY - Left Upper;  Surgeon: [name], MD;  Location: [facility];  Service: Cardiovascular;  Laterality: Left;   Social History   Occupational History   Occupation: Scientist, 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

## 2022-04-19 ENCOUNTER — Encounter (HOSPITAL_COMMUNITY): Payer: Self-pay | Admitting: *Deleted

## 2022-04-20 ENCOUNTER — Ambulatory Visit (INDEPENDENT_AMBULATORY_CARE_PROVIDER_SITE_OTHER): Payer: 59 | Admitting: Orthopedic Surgery

## 2022-04-20 DIAGNOSIS — Z89412 Acquired absence of left great toe: Secondary | ICD-10-CM

## 2022-04-20 DIAGNOSIS — S98112A Complete traumatic amputation of left great toe, initial encounter: Secondary | ICD-10-CM

## 2022-04-28 ENCOUNTER — Telehealth: Payer: Self-pay | Admitting: Orthopedic Surgery

## 2022-04-28 NOTE — Telephone Encounter (Signed)
Patient states that her wound is opening at the end of her toe . She states that its draining some bloody fluid. Please call patient

## 2022-04-28 NOTE — Telephone Encounter (Signed)
SW pt , she is coming in tomorrow 04/29/22

## 2022-04-29 ENCOUNTER — Ambulatory Visit (INDEPENDENT_AMBULATORY_CARE_PROVIDER_SITE_OTHER): Payer: 59 | Admitting: Orthopedic Surgery

## 2022-04-29 DIAGNOSIS — Z89412 Acquired absence of left great toe: Secondary | ICD-10-CM

## 2022-04-29 DIAGNOSIS — S98112A Complete traumatic amputation of left great toe, initial encounter: Secondary | ICD-10-CM

## 2022-04-29 MED ORDER — DOXYCYCLINE HYCLATE 100 MG PO TABS: 100.0000 mg | ORAL_TABLET | Freq: Two times a day (BID) | ORAL | 0 refills | Status: DC

## 2022-04-30 ENCOUNTER — Encounter: Payer: Self-pay | Admitting: Orthopedic Surgery

## 2022-04-30 NOTE — Progress Notes (Signed)
Office Visit Note   Patient: [name]           Date of Birth: [birth year]           MRN: [id number] Visit Date: 04/20/2022              Requested by: [name], MD [address] [place],  [place] [address] PCP: [name], MD  Chief Complaint  Patient presents with   Left Foot - Routine Post Op    04/02/2022 left Gt amputation       HPI: Patient is a 37 year old woman who presents 3 weeks status post left great toe amputation.  Patient states that she fell approximately 45 minutes ago.  Patient states she did not have her walker.  She complains of lateral ankle pain.  Assessment & Plan: Visit Diagnoses:  1. Amputation of left great toe [facility])     Plan: Patient is provided a note to be out of work for 3 more weeks.  Follow-Up Instructions: Return in about 3 weeks (around 05/11/2022).   Ortho Exam  Patient is alert, oriented, no adenopathy, well-dressed, normal affect, normal respiratory effort. Examination patient sustained a lateral ankle sprain there is no tenderness to palpation.  She does have tightness of the Achilles and was given instructions and demonstrated Achilles stretching.  The incision is healing well sutures are harvested today.  Patient works on her feet at [employer] will need additional time off.  Imaging: No results found. No images are attached to the encounter.  Labs: Lab Results  Component Value Date   HGBA1C 9.1 (H) 03/30/2022   HGBA1C 9.2 (H) 12/28/2021   HGBA1C 8.0 (H) 05/30/2021   ESRSEDRATE 88 (H) 03/31/2022   ESRSEDRATE 100 (H) 03/29/2022   CRP 14.3 (H) 03/31/2022   CRP 19.2 (H) 03/29/2022   CRP 1.0 (H) 01/01/2022   REPTSTATUS 04/03/2022 FINAL 03/29/2022   REPTSTATUS 04/03/2022 FINAL 03/29/2022   GRAMSTAIN  10/28/2018    ABUNDANT WBC PRESENT, PREDOMINANTLY PMN FEW GRAM POSITIVE COCCI    CULT  03/29/2022    NO GROWTH 5 DAYS Performed at [facility], [address], [place], [place] [address]    CULT  03/29/2022     NO GROWTH 5 DAYS Performed at [facility], [address] [place], [place] [address]    [place] 06/09/2017     Lab Results  Component Value Date   ALBUMIN 3.0 (L) 04/03/2022   ALBUMIN 3.9 03/29/2022   ALBUMIN 3.1 (L) 01/01/2022    Lab Results  Component Value Date   MG 1.9 12/28/2021   MG 2.2 09/28/2021   MG 2.0 06/13/2021   Lab Results  Component Value Date   VD25OH 17 (L) 03/23/2022   VD25OH 13 (L) 04/17/2020   VD25OH 8.9 (L) 03/20/2018    No results found for: "PREALBUMIN"    Latest Ref Rng & Units 04/04/2022    2:52 AM 04/03/2022    8:33 AM 04/03/2022    3:07 AM  CBC EXTENDED  WBC 4.0 - 10.5 K/uL 7.1  5.8  5.3   RBC 3.87 - 5.11 MIL/uL 2.79  3.11  3.12   Hemoglobin 12.0 - 15.0 g/dL 8.6  9.4  9.6   HCT 36.0 - 46.0 % 27.6  31.3  30.6   Platelets 150 - 400 K/uL 201  215  188   NEUT# 1.7 - 7.7 K/uL 4.0   4.2   Lymph# 0.7 - 4.0 K/uL 2.3   0.7  There is no height or weight on file to calculate BMI.  Orders:  No orders of the defined types were placed in this encounter.  No orders of the defined types were placed in this encounter.    Procedures: No procedures performed  Clinical Data: No additional findings.  ROS:  All other systems negative, except as noted in the HPI. Review of Systems  Objective: Vital Signs: There were no vitals taken for this visit.  Specialty Comments:  No specialty comments available.  PMFS History: Patient Active Problem List   Diagnosis Date Noted   Open wound of left foot 04/02/2022   Osteomyelitis of great toe of left foot ([place]) 03/30/2022   Diabetic foot ulcer ([place]) 03/29/2022   Abdominal pain, epigastric    Abnormal CT scan, colon    Hematemesis with nausea 12/28/2021   COVID-19 virus infection 12/27/2021   Liver lesion 12/27/2021   GI bleeding 12/26/2021   Esophageal dysphagia    Esophageal stricture    Seizures ([place]) 10/30/2020   [place] grade D esophagitis 08/09/2020   DKA,  type 2 ([place]) 07/31/2020   Polyneuropathy associated with underlying disease ([place]) 04/29/2020   Renal osteodystrophy 04/22/2020   Hiatal hernia 04/22/2020   Overweight (BMI 25.0-29.9) 04/22/2020   Uncontrolled type 1 diabetes mellitus with hyperglycemia ([place]) 04/20/2020   GERD (gastroesophageal reflux disease) 04/20/2020   Wide-complex tachycardia    ESRD on hemodialysis ([place]) 05/01/2018   Iron deficiency anemia, unspecified 123456   Complication of vascular dialysis catheter 03/27/2018   Kidney transplant failure 03/27/2018   Renal sclerosis, unspecified 03/27/2018   Hyperlipidemia 123456   Metabolic acidosis 99991111   Incontinence of bowel 02/03/2018   Chronic pain 11/11/2017   Chronic cholecystitis 06/29/2017   Diabetes mellitus type 1 ([place]) 05/25/2015   Nausea and vomiting    Renal transplant recipient    Immunosuppressed status ([place])    ESRD (end stage renal disease) ([place]) 09/30/2014   Sepsis ([place]) 06/24/2014   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   Sleep-wake schedule disorder, 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   Chronic kidney disease 01/04/2010   OVARIAN FAILURE, PREMATURE 03/11/2009   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   OBESITY 09/25/2007   Anemia due to chronic kidney disease 09/25/2007   LEARNING DISABILITY 09/25/2007   Essential hypertension, benign 09/25/2007   Past Medical History:  Diagnosis Date   Anemia    Blood transfusion without reported diagnosis    Cellulitis of left leg 03/01/2018   Chronic kidney disease    kidney transplant 07   Diabetes mellitus    Pt reports diagnosis in June 2011, Type 2   Diabetes mellitus without complication ([place])    Esophageal obstruction due to food impaction    GERD (gastroesophageal reflux disease)    Hyperlipidemia    Hypertension    Intra-abdominal abscess ([place]) 10/28/2018   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   LEARNING DISABILITY 09/25/2007    Qualifier: Diagnosis of  By: Deborra Medina MD, Talia     Nausea and vomiting    Prolonged Q-T interval on ECG    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   Pyelonephritis 06/23/2014   Renal and perinephric abscess 11/01/2018   Renal disorder    Seasonal allergies    Seizures ([place])    UTI (urinary tract infection) 01/09/2015   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   Qualifier: Diagnosis of  By: Carlena Sax  MD, Colletta [place]      Family History  Problem Relation Age of Onset   Arthritis  Mother    Hypertension Mother    Aneurysm Mother        died of brain aneurysm   CAD Father        Has 3 stents   Diabetes Father        borderline   Early death Brother        Died in war   Colon cancer Neg Hx    Esophageal cancer Neg Hx    Rectal cancer Neg Hx    Stomach cancer Neg Hx     Past Surgical History:  Procedure Laterality Date   AMPUTATION Left 04/02/2022   Procedure: AMPUTATION LEFT GREAT TOE;  Surgeon: Newt Minion, MD;  Location: [facility];  Service: Orthopedics;  Laterality: Left;   ARTERIOVENOUS GRAFT PLACEMENT Bilateral    "neither work" (10/24/2017)   AV FISTULA PLACEMENT Left 10/26/2018   Procedure: CREATION OF ARTERIOVENOUS FISTULA  LEFT ARM;  Surgeon: [name], MD;  Location: [facility];  Service: Vascular;  Laterality: Left;   AV FISTULA PLACEMENT Left 05/23/2020   Procedure: LEFT ARM ARTERIOVENOUS (AV) FISTULA CREATION;  Surgeon: [name], MD;  Location: [facility];  Service: Vascular;  Laterality: Left;   BALLOON DILATION N/A 01/19/2021   Procedure: Larrie Kass DILATION;  Surgeon: Gatha Mayer, MD;  [facility];  Service: Endoscopy;  Laterality: N/A;   BALLOON DILATION N/A 02/10/2021   Procedure: BALLOON DILATION;  Surgeon: Irene Shipper, MD;  [facility];  Service: Endoscopy;  Laterality: N/A;   BALLOON DILATION N/A 02/19/2021   Procedure: BALLOON DILATION;  Surgeon: Sharyn Creamer, MD;  [facility];  Service: Gastroenterology;  Laterality: N/A;   BASCILIC VEIN TRANSPOSITION  Left 12/21/2018   Procedure: Left arm BASILIC VEIN TRANSPOSITION SECOND STAGE;  Surgeon: [name], MD;  Location: [facility];  Service: Vascular;  Laterality: Left;   BIOPSY  12/29/2021   Procedure: BIOPSY;  Surgeon: Ladene Artist, MD;  Location: [facility];  Service: Gastroenterology;;   CHOLECYSTECTOMY N/A 06/30/2017   Procedure: LAPAROSCOPIC CHOLECYSTECTOMY WITH INTRAOPERATIVE CHOLANGIOGRAM;  Surgeon: Excell Seltzer, MD;  [facility];  Service: General;  Laterality: N/A;   ESOPHAGOGASTRODUODENOSCOPY (EGD) WITH PROPOFOL N/A 07/04/2017   Procedure: ESOPHAGOGASTRODUODENOSCOPY (EGD) WITH PROPOFOL;  Surgeon: Clarene Essex, MD;  [facility];  Service: Endoscopy;  Laterality: N/A;   ESOPHAGOGASTRODUODENOSCOPY (EGD) WITH PROPOFOL N/A 08/10/2020   Procedure: ESOPHAGOGASTRODUODENOSCOPY (EGD) WITH PROPOFOL;  Surgeon: Doran Stabler, MD;  Location: [facility];  Service: Gastroenterology;  Laterality: N/A;   ESOPHAGOGASTRODUODENOSCOPY (EGD) WITH PROPOFOL N/A 01/19/2021   Procedure: ESOPHAGOGASTRODUODENOSCOPY (EGD) WITH PROPOFOL;  Surgeon: Gatha Mayer, MD;  [facility];  Service: Endoscopy;  Laterality: N/A;  WITH FLUOROSCOPY AND DILATION   ESOPHAGOGASTRODUODENOSCOPY (EGD) WITH PROPOFOL N/A 02/03/2021   Procedure: ESOPHAGOGASTRODUODENOSCOPY (EGD) WITH PROPOFOL;  Surgeon: Gatha Mayer, MD;  [facility];  Service: Endoscopy;  Laterality: N/A;   ESOPHAGOGASTRODUODENOSCOPY (EGD) WITH PROPOFOL N/A 02/10/2021   Procedure: ESOPHAGOGASTRODUODENOSCOPY (EGD) WITH PROPOFOL;  Surgeon: Irene Shipper, MD;  [facility];  Service: Endoscopy;  Laterality: N/A;  Balloon Dilation   ESOPHAGOGASTRODUODENOSCOPY (EGD) WITH PROPOFOL N/A 02/19/2021   Procedure: ESOPHAGOGASTRODUODENOSCOPY (EGD) WITH PROPOFOL;  Surgeon: Sharyn Creamer, MD;  [facility];  Service: Gastroenterology;  Laterality: N/A;   ESOPHAGOGASTRODUODENOSCOPY (EGD) WITH PROPOFOL N/A  03/12/2021   Procedure: ESOPHAGOGASTRODUODENOSCOPY (EGD) WITH PROPOFOL;  Surgeon: Lavena Bullion, DO;  [facility];  Service: Gastroenterology;  Laterality: N/A;   ESOPHAGOGASTRODUODENOSCOPY (  EGD) WITH PROPOFOL N/A 12/29/2021   Procedure: ESOPHAGOGASTRODUODENOSCOPY (EGD) WITH PROPOFOL;  Surgeon: Ladene Artist, MD;  Location: [facility];  Service: Gastroenterology;  Laterality: N/A;   FLEXIBLE SIGMOIDOSCOPY N/A 12/29/2021   Procedure: FLEXIBLE SIGMOIDOSCOPY;  Surgeon: Ladene Artist, MD;  Location: [facility];  Service: Gastroenterology;  Laterality: N/A;   FOREIGN BODY REMOVAL N/A 02/03/2021   Procedure: FOREIGN BODY REMOVAL;  Surgeon: Gatha Mayer, MD;  [facility];  Service: Endoscopy;  Laterality: N/A;   INSERTION OF DIALYSIS CATHETER N/A 03/20/2018   Procedure: INSERTION OF TUNNELED DIALYSIS CATHETER - RIGHT INTERANL JUGULAR PLACEMENT;  Surgeon: Angelia Mould, MD;  Location: [place];  Service: Vascular;  Laterality: N/A;   IR FLUORO GUIDE CV LINE RIGHT  04/18/2020   IR GUIDED DRAIN W CATHETER PLACEMENT  10/28/2018   KIDNEY TRANSPLANT  2007   KIDNEY TRANSPLANT Right    PARATHYROIDECTOMY  ?2012   "3/4 removed" (10/24/2017)   RENAL BIOPSY Bilateral 2003   REVISON OF ARTERIOVENOUS FISTULA Left 09/24/2020   Procedure: LEFT UPPER ARM ARTERIOVENOUS GRAFT CREATION;  Surgeon: [name], MD;  Location: [facility];  Service: Vascular;  Laterality: Left;   UPPER EXTREMITY VENOGRAPHY Bilateral 10/19/2018   Procedure: UPPER EXTREMITY VENOGRAPHY;  Surgeon: [name], MD;  Location: [place] [facility] [facility];  Service: Cardiovascular;  Laterality: Bilateral;  Bilateral    UPPER EXTREMITY VENOGRAPHY Left 09/04/2020   Procedure: UPPER EXTREMITY VENOGRAPHY - Left Upper;  Surgeon: [name], MD;  Location: [facility];  Service: Cardiovascular;  Laterality: Left;   Social History   Occupational History   Occupation: Scientist, 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

## 2022-04-30 NOTE — Progress Notes (Signed)
Office Visit Note   Patient: [name]           Date of Birth: 1986-01-25           MRN: [id number] Visit Date: 04/29/2022              Requested by: [name], MD [address] [place],  [place] [address] PCP: [name], MD  Chief Complaint  Patient presents with   Left Foot - Routine Post Op    04/02/2022 left GT amputation      HPI: Patient is a 37 year old woman who presents 4 weeks status post left great toe amputation.  Patient states she has clear drainage.  She is currently wearing a postoperative shoe she has completed her antibiotics.  Assessment & Plan: Visit Diagnoses:  1. Amputation of left great toe ([place])     Plan: Will refill her prescription for doxycycline discussed the importance of elevation and minimizing weightbearing.  Follow-Up Instructions: Return in about 2 weeks (around 05/13/2022).   Ortho Exam  Patient is alert, oriented, no adenopathy, well-dressed, normal affect, normal respiratory effort. Examination there is clear serous drainage from the great toe the wound edges are well-approximated.  There is no cellulitis.  No tenderness to palpation.  Most recent hemoglobin A1c 9.1.  Imaging: No results found. No images are attached to the encounter.  Labs: Lab Results  Component Value Date   HGBA1C 9.1 (H) 03/30/2022   HGBA1C 9.2 (H) 12/28/2021   HGBA1C 8.0 (H) 05/30/2021   ESRSEDRATE 88 (H) 03/31/2022   ESRSEDRATE 100 (H) 03/29/2022   CRP 14.3 (H) 03/31/2022   CRP 19.2 (H) 03/29/2022   CRP 1.0 (H) 01/01/2022   REPTSTATUS 04/03/2022 FINAL 03/29/2022   REPTSTATUS 04/03/2022 FINAL 03/29/2022   GRAMSTAIN  10/28/2018    ABUNDANT WBC PRESENT, PREDOMINANTLY PMN FEW GRAM POSITIVE COCCI    CULT  03/29/2022    NO GROWTH 5 DAYS Performed at [facility], [address] [place], [place] [address]    CULT  03/29/2022    NO GROWTH 5 DAYS Performed at [facility], [address], [place], [place] [address]    [place] 06/09/2017     Lab Results  Component Value Date   ALBUMIN 3.0 (L) 04/03/2022   ALBUMIN 3.9 03/29/2022   ALBUMIN 3.1 (L) 01/01/2022    Lab Results  Component Value Date   MG 1.9 12/28/2021   MG 2.2 09/28/2021   MG 2.0 06/13/2021   Lab Results  Component Value Date   VD25OH 17 (L) 03/23/2022   VD25OH 13 (L) 04/17/2020   VD25OH 8.9 (L) 03/20/2018    No results found for: "PREALBUMIN"    Latest Ref Rng & Units 04/04/2022    2:52 AM 04/03/2022    8:33 AM 04/03/2022    3:07 AM  CBC EXTENDED  WBC 4.0 - 10.5 K/uL 7.1  5.8  5.3   RBC 3.87 - 5.11 MIL/uL 2.79  3.11  3.12   Hemoglobin 12.0 - 15.0 g/dL 8.6  9.4  9.6   HCT 36.0 - 46.0 % 27.6  31.3  30.6   Platelets 150 - 400 K/uL 201  215  188   NEUT# 1.7 - 7.7 K/uL 4.0   4.2   Lymph# 0.7 - 4.0 K/uL 2.3   0.7      There is no height or weight on file to calculate BMI.  Orders:  No orders of the defined types were placed in this encounter.  Meds ordered this encounter  Medications   doxycycline (VIBRA-TABS) 100 MG tablet    Sig: Take 1 tablet (100 mg total) by mouth 2 (two) times daily.    Dispense:  20 tablet    Refill:  0     Procedures: No procedures performed  Clinical Data: No additional findings.  ROS:  All other systems negative, except as noted in the HPI. Review of Systems  Objective: Vital Signs: There were no vitals taken for this visit.  Specialty Comments:  No specialty comments available.  PMFS History: Patient Active Problem List   Diagnosis Date Noted   Open wound of left foot 04/02/2022   Osteomyelitis of great toe of left foot ([place]) 03/30/2022   Diabetic foot ulcer ([place]) 03/29/2022   Abdominal pain, epigastric    Abnormal CT scan, colon    Hematemesis with nausea 12/28/2021   COVID-19 virus infection 12/27/2021   Liver lesion 12/27/2021   GI bleeding 12/26/2021   Esophageal dysphagia    Esophageal stricture    Seizures ([place]) 10/30/2020   [place] grade D  esophagitis 08/09/2020   DKA, type 2 ([place]) 07/31/2020   Polyneuropathy associated with underlying disease ([place]) 04/29/2020   Renal osteodystrophy 04/22/2020   Hiatal hernia 04/22/2020   Overweight (BMI 25.0-29.9) 04/22/2020   Uncontrolled type 1 diabetes mellitus with hyperglycemia ([place]) 04/20/2020   GERD (gastroesophageal reflux disease) 04/20/2020   Wide-complex tachycardia    ESRD on hemodialysis ([place]) 05/01/2018   Iron deficiency anemia, unspecified 123456   Complication of vascular dialysis catheter 03/27/2018   Kidney transplant failure 03/27/2018   Renal sclerosis, unspecified 03/27/2018   Hyperlipidemia 123456   Metabolic acidosis 99991111   Incontinence of bowel 02/03/2018   Chronic pain 11/11/2017   Chronic cholecystitis 06/29/2017   Diabetes mellitus type 1 ([place]) 05/25/2015   Nausea and vomiting    Renal transplant recipient    Immunosuppressed status ([place])    ESRD (end stage renal disease) ([place]) 09/30/2014   Sepsis ([place]) 06/24/2014   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   Sleep-wake schedule disorder, 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   Chronic kidney disease 01/04/2010   OVARIAN FAILURE, PREMATURE 03/11/2009   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   OBESITY 09/25/2007   Anemia due to chronic kidney disease 09/25/2007   LEARNING DISABILITY 09/25/2007   Essential hypertension, benign 09/25/2007   Past Medical History:  Diagnosis Date   Anemia    Blood transfusion without reported diagnosis    Cellulitis of left leg 03/01/2018   Chronic kidney disease    kidney transplant 07   Diabetes mellitus    Pt reports diagnosis in June 2011, Type 2   Diabetes mellitus without complication ([place])    Esophageal obstruction due to food impaction    GERD (gastroesophageal reflux disease)    Hyperlipidemia    Hypertension    Intra-abdominal abscess ([place]) 10/28/2018   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   LEARNING  DISABILITY 09/25/2007   Qualifier: Diagnosis of  By: Deborra Medina MD, Talia     Nausea and vomiting    Prolonged Q-T interval on ECG    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   Pyelonephritis 06/23/2014   Renal and perinephric abscess 11/01/2018   Renal disorder    Seasonal allergies    Seizures ([place])    UTI (urinary tract infection) 01/09/2015   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   Qualifier: Diagnosis of  By: Carlena Sax  MD, Colletta [place]      Family History  Problem Relation  Age of Onset   Arthritis Mother    Hypertension Mother    Aneurysm Mother        died of brain aneurysm   CAD Father        Has 3 stents   Diabetes Father        borderline   Early death Brother        Died in war   Colon cancer Neg Hx    Esophageal cancer Neg Hx    Rectal cancer Neg Hx    Stomach cancer Neg Hx     Past Surgical History:  Procedure Laterality Date   AMPUTATION Left 04/02/2022   Procedure: AMPUTATION LEFT GREAT TOE;  Surgeon: Newt Minion, MD;  Location: [place];  Service: Orthopedics;  Laterality: Left;   ARTERIOVENOUS GRAFT PLACEMENT Bilateral    "neither work" (10/24/2017)   AV FISTULA PLACEMENT Left 10/26/2018   Procedure: CREATION OF ARTERIOVENOUS FISTULA  LEFT ARM;  Surgeon: [name], MD;  Location: [place];  Service: Vascular;  Laterality: Left;   AV FISTULA PLACEMENT Left 05/23/2020   Procedure: LEFT ARM ARTERIOVENOUS (AV) FISTULA CREATION;  Surgeon: [name], MD;  Location: [place];  Service: Vascular;  Laterality: Left;   BALLOON DILATION N/A 01/19/2021   Procedure: Larrie Kass DILATION;  Surgeon: Gatha Mayer, MD;  [facility];  Service: Endoscopy;  Laterality: N/A;   BALLOON DILATION N/A 02/10/2021   Procedure: BALLOON DILATION;  Surgeon: Irene Shipper, MD;  [facility];  Service: Endoscopy;  Laterality: N/A;   BALLOON DILATION N/A 02/19/2021   Procedure: BALLOON DILATION;  Surgeon: Sharyn Creamer, MD;  [facility];  Service: Gastroenterology;  Laterality: N/A;    BASCILIC VEIN TRANSPOSITION Left 12/21/2018   Procedure: Left arm BASILIC VEIN TRANSPOSITION SECOND STAGE;  Surgeon: [name], MD;  Location: [place];  Service: Vascular;  Laterality: Left;   BIOPSY  12/29/2021   Procedure: BIOPSY;  Surgeon: Ladene Artist, MD;  Location: [place];  Service: Gastroenterology;;   CHOLECYSTECTOMY N/A 06/30/2017   Procedure: LAPAROSCOPIC CHOLECYSTECTOMY WITH INTRAOPERATIVE CHOLANGIOGRAM;  Surgeon: Excell Seltzer, MD;  [facility];  Service: General;  Laterality: N/A;   ESOPHAGOGASTRODUODENOSCOPY (EGD) WITH PROPOFOL N/A 07/04/2017   Procedure: ESOPHAGOGASTRODUODENOSCOPY (EGD) WITH PROPOFOL;  Surgeon: Clarene Essex, MD;  [facility];  Service: Endoscopy;  Laterality: N/A;   ESOPHAGOGASTRODUODENOSCOPY (EGD) WITH PROPOFOL N/A 08/10/2020   Procedure: ESOPHAGOGASTRODUODENOSCOPY (EGD) WITH PROPOFOL;  Surgeon: Doran Stabler, MD;  Location: [place] [facility];  Service: Gastroenterology;  Laterality: N/A;   ESOPHAGOGASTRODUODENOSCOPY (EGD) WITH PROPOFOL N/A 01/19/2021   Procedure: ESOPHAGOGASTRODUODENOSCOPY (EGD) WITH PROPOFOL;  Surgeon: Gatha Mayer, MD;  [facility];  Service: Endoscopy;  Laterality: N/A;  WITH FLUOROSCOPY AND DILATION   ESOPHAGOGASTRODUODENOSCOPY (EGD) WITH PROPOFOL N/A 02/03/2021   Procedure: ESOPHAGOGASTRODUODENOSCOPY (EGD) WITH PROPOFOL;  Surgeon: Gatha Mayer, MD;  [facility];  Service: Endoscopy;  Laterality: N/A;   ESOPHAGOGASTRODUODENOSCOPY (EGD) WITH PROPOFOL N/A 02/10/2021   Procedure: ESOPHAGOGASTRODUODENOSCOPY (EGD) WITH PROPOFOL;  Surgeon: Irene Shipper, MD;  [facility];  Service: Endoscopy;  Laterality: N/A;  Balloon Dilation   ESOPHAGOGASTRODUODENOSCOPY (EGD) WITH PROPOFOL N/A 02/19/2021   Procedure: ESOPHAGOGASTRODUODENOSCOPY (EGD) WITH PROPOFOL;  Surgeon: Sharyn Creamer, MD;  [facility];  Service: Gastroenterology;  Laterality: N/A;   ESOPHAGOGASTRODUODENOSCOPY  (EGD) WITH PROPOFOL N/A 03/12/2021   Procedure: ESOPHAGOGASTRODUODENOSCOPY (EGD) WITH PROPOFOL;  Surgeon: Lavena Bullion, DO;  [facility];  Service: Gastroenterology;  Laterality: N/A;   ESOPHAGOGASTRODUODENOSCOPY (EGD) WITH PROPOFOL N/A 12/29/2021   Procedure: ESOPHAGOGASTRODUODENOSCOPY (EGD) WITH PROPOFOL;  Surgeon: Ladene Artist, MD;  Location: [facility];  Service: Gastroenterology;  Laterality: N/A;   FLEXIBLE SIGMOIDOSCOPY N/A 12/29/2021   Procedure: FLEXIBLE SIGMOIDOSCOPY;  Surgeon: Ladene Artist, MD;  Location: [place];  Service: Gastroenterology;  Laterality: N/A;   FOREIGN BODY REMOVAL N/A 02/03/2021   Procedure: FOREIGN BODY REMOVAL;  Surgeon: Gatha Mayer, MD;  [facility];  Service: Endoscopy;  Laterality: N/A;   INSERTION OF DIALYSIS CATHETER N/A 03/20/2018   Procedure: INSERTION OF TUNNELED DIALYSIS CATHETER - RIGHT INTERANL JUGULAR PLACEMENT;  Surgeon: Angelia Mould, MD;  Location: [place];  Service: Vascular;  Laterality: N/A;   IR FLUORO GUIDE CV LINE RIGHT  04/18/2020   IR GUIDED DRAIN W CATHETER PLACEMENT  10/28/2018   KIDNEY TRANSPLANT  2007   KIDNEY TRANSPLANT Right    PARATHYROIDECTOMY  ?2012   "3/4 removed" (10/24/2017)   RENAL BIOPSY Bilateral 2003   REVISON OF ARTERIOVENOUS FISTULA Left 09/24/2020   Procedure: LEFT UPPER ARM ARTERIOVENOUS GRAFT CREATION;  Surgeon: [name], MD;  Location: [facility];  Service: Vascular;  Laterality: Left;   UPPER EXTREMITY VENOGRAPHY Bilateral 10/19/2018   Procedure: UPPER EXTREMITY VENOGRAPHY;  Surgeon: [name], MD;  Location: [facility];  Service: Cardiovascular;  Laterality: Bilateral;  Bilateral    UPPER EXTREMITY VENOGRAPHY Left 09/04/2020   Procedure: UPPER EXTREMITY VENOGRAPHY - Left Upper;  Surgeon: [name], MD;  Location: [facility];  Service: Cardiovascular;  Laterality: Left;   Social History   Occupational History   Occupation:  Scientist, 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

## 2022-05-11 ENCOUNTER — Ambulatory Visit: Payer: 59 | Admitting: Orthopedic Surgery

## 2022-05-18 ENCOUNTER — Encounter: Payer: 59 | Admitting: Orthopedic Surgery

## 2022-05-18 ENCOUNTER — Ambulatory Visit (INDEPENDENT_AMBULATORY_CARE_PROVIDER_SITE_OTHER): Payer: 59 | Admitting: Orthopedic Surgery

## 2022-05-18 ENCOUNTER — Encounter (HOSPITAL_COMMUNITY): Payer: Self-pay | Admitting: *Deleted

## 2022-05-18 DIAGNOSIS — Z89412 Acquired absence of left great toe: Secondary | ICD-10-CM

## 2022-05-20 ENCOUNTER — Encounter (HOSPITAL_COMMUNITY): Payer: Self-pay | Admitting: *Deleted

## 2022-05-27 ENCOUNTER — Ambulatory Visit (INDEPENDENT_AMBULATORY_CARE_PROVIDER_SITE_OTHER): Payer: 59 | Admitting: Orthopedic Surgery

## 2022-05-27 DIAGNOSIS — S98112A Complete traumatic amputation of left great toe, initial encounter: Secondary | ICD-10-CM

## 2022-05-27 DIAGNOSIS — Z89412 Acquired absence of left great toe: Secondary | ICD-10-CM

## 2022-05-31 ENCOUNTER — Encounter: Payer: Self-pay | Admitting: Orthopedic Surgery

## 2022-05-31 NOTE — Progress Notes (Signed)
Office Visit Note   Patient: [name]           Date of Birth: [birth year]           MRN: [id number] Visit Date: 05/27/2022              Requested by: [name], MD [address] [place],  [place] [address] PCP: [name], MD  Chief Complaint  Patient presents with   Left Foot - Routine Post Op    04/02/2022 left GT amputation       HPI: Patient is a 37 year old woman who is 2 months status post left great toe amputation she has been on doxycycline minimize weightbearing.  Patient is currently full weightbearing in a postoperative shoe.  Assessment & Plan: Visit Diagnoses:  1. Amputation of left great toe ([place])     Plan: Recommended she continue out of work for 4 additional weeks until the ulcer has completely healed.  Patient states she will follow-up with the wound center.  She states she prefers using the [facility] on her new ulcer.  Follow-Up Instructions: Return in about 4 weeks (around 06/24/2022).   Ortho Exam  Patient is alert, oriented, no adenopathy, well-dressed, normal affect, normal respiratory effort. Examination the great toe amputation incision is well-healed left foot.  Patient states that she just started developing an ulcer over the medial plantar aspect of her foot.  The blister has resolved and patient has an area of granulation tissue that is 15 x 25 mm and is flat there is no cellulitis or drainage no exposed bone or tendon.  Imaging: No results found.   Labs: Lab Results  Component Value Date   HGBA1C 9.1 (H) 03/30/2022   HGBA1C 9.2 (H) 12/28/2021   HGBA1C 8.0 (H) 05/30/2021   ESRSEDRATE 88 (H) 03/31/2022   ESRSEDRATE 100 (H) 03/29/2022   CRP 14.3 (H) 03/31/2022   CRP 19.2 (H) 03/29/2022   CRP 1.0 (H) 01/01/2022   REPTSTATUS 04/03/2022 FINAL 03/29/2022   REPTSTATUS 04/03/2022 FINAL 03/29/2022   GRAMSTAIN  10/28/2018    ABUNDANT WBC PRESENT, PREDOMINANTLY PMN FEW GRAM POSITIVE COCCI    CULT  03/29/2022    NO GROWTH 5  DAYS Performed at [facility], [address] [place], [place] [address]    CULT  03/29/2022    NO GROWTH 5 DAYS Performed at [facility], [address] [place], [place] [address]    [place] 06/09/2017     Lab Results  Component Value Date   ALBUMIN 3.0 (L) 04/03/2022   ALBUMIN 3.9 03/29/2022   ALBUMIN 3.1 (L) 01/01/2022    Lab Results  Component Value Date   MG 1.9 12/28/2021   MG 2.2 09/28/2021   MG 2.0 06/13/2021   Lab Results  Component Value Date   VD25OH 17 (L) 03/23/2022   VD25OH 13 (L) 04/17/2020   VD25OH 8.9 (L) 03/20/2018    No results found for: "PREALBUMIN"    Latest Ref Rng & Units 04/04/2022    2:52 AM 04/03/2022    8:33 AM 04/03/2022    3:07 AM  CBC EXTENDED  WBC 4.0 - 10.5 K/uL 7.1  5.8  5.3   RBC 3.87 - 5.11 MIL/uL 2.79  3.11  3.12   Hemoglobin 12.0 - 15.0 g/dL 8.6  9.4  9.6   HCT 36.0 - 46.0 % 27.6  31.3  30.6   Platelets 150 - 400 K/uL 201  215  188   NEUT# 1.7 -  7.7 K/uL 4.0   4.2   Lymph# 0.7 - 4.0 K/uL 2.3   0.7      There is no height or weight on file to calculate BMI.  Orders:  No orders of the defined types were placed in this encounter.  No orders of the defined types were placed in this encounter.    Procedures: No procedures performed  Clinical Data: No additional findings.  ROS:  All other systems negative, except as noted in the HPI. Review of Systems  Objective: Vital Signs: There were no vitals taken for this visit.  Specialty Comments:  No specialty comments available.  PMFS History: Patient Active Problem List   Diagnosis Date Noted   Open wound of left foot 04/02/2022   Osteomyelitis of great toe of left foot ([place]) 03/30/2022   Diabetic foot ulcer ([place]) 03/29/2022   Abdominal pain, epigastric    Abnormal CT scan, colon    Hematemesis with nausea 12/28/2021   COVID-19 virus infection 12/27/2021   Liver lesion 12/27/2021   GI bleeding 12/26/2021   Esophageal dysphagia     Esophageal stricture    Seizures ([place]) 10/30/2020   [place] grade D esophagitis 08/09/2020   DKA, type 2 ([place]) 07/31/2020   Polyneuropathy associated with underlying disease ([place]) 04/29/2020   Renal osteodystrophy 04/22/2020   Hiatal hernia 04/22/2020   Overweight (BMI 25.0-29.9) 04/22/2020   Uncontrolled type 1 diabetes mellitus with hyperglycemia ([place]) 04/20/2020   GERD (gastroesophageal reflux disease) 04/20/2020   Wide-complex tachycardia    ESRD on hemodialysis ([place]) 05/01/2018   Iron deficiency anemia, unspecified 123456   Complication of vascular dialysis catheter 03/27/2018   Kidney transplant failure 03/27/2018   Renal sclerosis, unspecified 03/27/2018   Hyperlipidemia 123456   Metabolic acidosis 99991111   Incontinence of bowel 02/03/2018   Chronic pain 11/11/2017   Chronic cholecystitis 06/29/2017   Diabetes mellitus type 1 ([place]) 05/25/2015   Nausea and vomiting    Renal transplant recipient    Immunosuppressed status ([place])    ESRD (end stage renal disease) ([place]) 09/30/2014   Sepsis ([place]) 06/24/2014   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   Sleep-wake schedule disorder, 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   Chronic kidney disease 01/04/2010   OVARIAN FAILURE, PREMATURE 03/11/2009   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   OBESITY 09/25/2007   Anemia due to chronic kidney disease 09/25/2007   LEARNING DISABILITY 09/25/2007   Essential hypertension, benign 09/25/2007   Past Medical History:  Diagnosis Date   Anemia    Blood transfusion without reported diagnosis    Cellulitis of left leg 03/01/2018   Chronic kidney disease    kidney transplant 07   Diabetes mellitus    Pt reports diagnosis in June 2011, Type 2   Diabetes mellitus without complication ([place])    Esophageal obstruction due to food impaction    GERD (gastroesophageal reflux disease)    Hyperlipidemia    Hypertension    Intra-abdominal abscess ([place])  10/28/2018   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   LEARNING DISABILITY 09/25/2007   Qualifier: Diagnosis of  By: Deborra Medina MD, Talia     Nausea and vomiting    Prolonged Q-T interval on ECG    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   Pyelonephritis 06/23/2014   Renal and perinephric abscess 11/01/2018   Renal disorder    Seasonal allergies    Seizures ([place])    UTI (urinary tract infection) 01/09/2015   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   Qualifier: Diagnosis of  ByCarlena Sax  MD, Colletta [place]      Family History  Problem Relation Age of Onset   Arthritis Mother    Hypertension Mother    Aneurysm Mother        died of brain aneurysm   CAD Father        Has 3 stents   Diabetes Father        borderline   Early death Brother        Died in war   Colon cancer Neg Hx    Esophageal cancer Neg Hx    Rectal cancer Neg Hx    Stomach cancer Neg Hx     Past Surgical History:  Procedure Laterality Date   AMPUTATION Left 04/02/2022   Procedure: AMPUTATION LEFT GREAT TOE;  Surgeon: Newt Minion, MD;  Location: [facility];  Service: Orthopedics;  Laterality: Left;   ARTERIOVENOUS GRAFT PLACEMENT Bilateral    "neither work" (10/24/2017)   AV FISTULA PLACEMENT Left 10/26/2018   Procedure: CREATION OF ARTERIOVENOUS FISTULA  LEFT ARM;  Surgeon: [name], MD;  Location: [facility];  Service: Vascular;  Laterality: Left;   AV FISTULA PLACEMENT Left 05/23/2020   Procedure: LEFT ARM ARTERIOVENOUS (AV) FISTULA CREATION;  Surgeon: [name], MD;  Location: [facility];  Service: Vascular;  Laterality: Left;   BALLOON DILATION N/A 01/19/2021   Procedure: Larrie Kass DILATION;  Surgeon: Gatha Mayer, MD;  [facility];  Service: Endoscopy;  Laterality: N/A;   BALLOON DILATION N/A 02/10/2021   Procedure: BALLOON DILATION;  Surgeon: Irene Shipper, MD;  [facility];  Service: Endoscopy;  Laterality: N/A;   BALLOON DILATION N/A 02/19/2021   Procedure: BALLOON DILATION;  Surgeon: Sharyn Creamer,  MD;  [facility];  Service: Gastroenterology;  Laterality: N/A;   BASCILIC VEIN TRANSPOSITION Left 12/21/2018   Procedure: Left arm BASILIC VEIN TRANSPOSITION SECOND STAGE;  Surgeon: [name], MD;  Location: [place];  Service: Vascular;  Laterality: Left;   BIOPSY  12/29/2021   Procedure: BIOPSY;  Surgeon: Ladene Artist, MD;  Location: [facility];  Service: Gastroenterology;;   CHOLECYSTECTOMY N/A 06/30/2017   Procedure: LAPAROSCOPIC CHOLECYSTECTOMY WITH INTRAOPERATIVE CHOLANGIOGRAM;  Surgeon: Excell Seltzer, MD;  [facility];  Service: General;  Laterality: N/A;   ESOPHAGOGASTRODUODENOSCOPY (EGD) WITH PROPOFOL N/A 07/04/2017   Procedure: ESOPHAGOGASTRODUODENOSCOPY (EGD) WITH PROPOFOL;  Surgeon: Clarene Essex, MD;  [facility];  Service: Endoscopy;  Laterality: N/A;   ESOPHAGOGASTRODUODENOSCOPY (EGD) WITH PROPOFOL N/A 08/10/2020   Procedure: ESOPHAGOGASTRODUODENOSCOPY (EGD) WITH PROPOFOL;  Surgeon: Doran Stabler, MD;  Location: [facility];  Service: Gastroenterology;  Laterality: N/A;   ESOPHAGOGASTRODUODENOSCOPY (EGD) WITH PROPOFOL N/A 01/19/2021   Procedure: ESOPHAGOGASTRODUODENOSCOPY (EGD) WITH PROPOFOL;  Surgeon: Gatha Mayer, MD;  [facility];  Service: Endoscopy;  Laterality: N/A;  WITH FLUOROSCOPY AND DILATION   ESOPHAGOGASTRODUODENOSCOPY (EGD) WITH PROPOFOL N/A 02/03/2021   Procedure: ESOPHAGOGASTRODUODENOSCOPY (EGD) WITH PROPOFOL;  Surgeon: Gatha Mayer, MD;  [facility];  Service: Endoscopy;  Laterality: N/A;   ESOPHAGOGASTRODUODENOSCOPY (EGD) WITH PROPOFOL N/A 02/10/2021   Procedure: ESOPHAGOGASTRODUODENOSCOPY (EGD) WITH PROPOFOL;  Surgeon: Irene Shipper, MD;  [facility];  Service: Endoscopy;  Laterality: N/A;  Balloon Dilation   ESOPHAGOGASTRODUODENOSCOPY (EGD) WITH PROPOFOL N/A 02/19/2021   Procedure: ESOPHAGOGASTRODUODENOSCOPY (EGD) WITH PROPOFOL;  Surgeon: Sharyn Creamer, MD;  [facility];   Service: Gastroenterology;  Laterality: N/A;   ESOPHAGOGASTRODUODENOSCOPY (EGD) WITH PROPOFOL N/A 03/12/2021   Procedure: ESOPHAGOGASTRODUODENOSCOPY (EGD)  WITH PROPOFOL;  Surgeon: Lavena Bullion, DO;  [facility];  Service: Gastroenterology;  Laterality: N/A;   ESOPHAGOGASTRODUODENOSCOPY (EGD) WITH PROPOFOL N/A 12/29/2021   Procedure: ESOPHAGOGASTRODUODENOSCOPY (EGD) WITH PROPOFOL;  Surgeon: Ladene Artist, MD;  Location: [facility];  Service: Gastroenterology;  Laterality: N/A;   FLEXIBLE SIGMOIDOSCOPY N/A 12/29/2021   Procedure: FLEXIBLE SIGMOIDOSCOPY;  Surgeon: Ladene Artist, MD;  Location: [facility];  Service: Gastroenterology;  Laterality: N/A;   FOREIGN BODY REMOVAL N/A 02/03/2021   Procedure: FOREIGN BODY REMOVAL;  Surgeon: Gatha Mayer, MD;  [facility];  Service: Endoscopy;  Laterality: N/A;   INSERTION OF DIALYSIS CATHETER N/A 03/20/2018   Procedure: INSERTION OF TUNNELED DIALYSIS CATHETER - RIGHT INTERANL JUGULAR PLACEMENT;  Surgeon: Angelia Mould, MD;  Location: [facility];  Service: Vascular;  Laterality: N/A;   IR FLUORO GUIDE CV LINE RIGHT  04/18/2020   IR GUIDED DRAIN W CATHETER PLACEMENT  10/28/2018   KIDNEY TRANSPLANT  2007   KIDNEY TRANSPLANT Right    PARATHYROIDECTOMY  ?2012   "3/4 removed" (10/24/2017)   RENAL BIOPSY Bilateral 2003   REVISON OF ARTERIOVENOUS FISTULA Left 09/24/2020   Procedure: LEFT UPPER ARM ARTERIOVENOUS GRAFT CREATION;  Surgeon: [name], MD;  Location: [facility];  Service: Vascular;  Laterality: Left;   UPPER EXTREMITY VENOGRAPHY Bilateral 10/19/2018   Procedure: UPPER EXTREMITY VENOGRAPHY;  Surgeon: [name], MD;  Location: [facility];  Service: Cardiovascular;  Laterality: Bilateral;  Bilateral    UPPER EXTREMITY VENOGRAPHY Left 09/04/2020   Procedure: UPPER EXTREMITY VENOGRAPHY - Left Upper;  Surgeon: [name], MD;  Location: [facility];  Service: Cardiovascular;   Laterality: Left;   Social History   Occupational History   Occupation: Scientist, 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

## 2022-06-09 ENCOUNTER — Encounter: Payer: Self-pay | Admitting: Orthopedic Surgery

## 2022-06-09 NOTE — Progress Notes (Signed)
Appointment canceled.

## 2022-06-10 ENCOUNTER — Emergency Department (HOSPITAL_COMMUNITY)
Admission: EM | Admit: 2022-06-10 | Discharge: 2022-06-10 | Disposition: A | Discharge status: Home / Self Care | Payer: 59 | Attending: Emergency Medicine | Admitting: Emergency Medicine

## 2022-06-10 DIAGNOSIS — E875 Hyperkalemia: Secondary | ICD-10-CM | POA: Diagnosis not present

## 2022-06-10 DIAGNOSIS — Z992 Dependence on renal dialysis: Secondary | ICD-10-CM | POA: Diagnosis not present

## 2022-06-10 DIAGNOSIS — E1165 Type 2 diabetes mellitus with hyperglycemia: Secondary | ICD-10-CM | POA: Insufficient documentation

## 2022-06-10 DIAGNOSIS — Z91010 Allergy to peanuts: Secondary | ICD-10-CM | POA: Diagnosis not present

## 2022-06-10 DIAGNOSIS — N186 End stage renal disease: Secondary | ICD-10-CM | POA: Insufficient documentation

## 2022-06-10 DIAGNOSIS — Z794 Long term (current) use of insulin: Secondary | ICD-10-CM | POA: Diagnosis not present

## 2022-06-10 DIAGNOSIS — E1122 Type 2 diabetes mellitus with diabetic chronic kidney disease: Secondary | ICD-10-CM | POA: Diagnosis not present

## 2022-06-10 DIAGNOSIS — R42 Dizziness and giddiness: Secondary | ICD-10-CM | POA: Insufficient documentation

## 2022-06-10 LAB — COMPREHENSIVE METABOLIC PANEL
ALT: 10 U/L (ref 0–44)
AST: 19 U/L (ref 15–41)
Albumin: 4.2 g/dL (ref 3.5–5.0)
Alkaline Phosphatase: 67 U/L (ref 38–126)
Anion gap: 18 — ABNORMAL HIGH (ref 5–15)
BUN: 45 mg/dL — ABNORMAL HIGH (ref 6–20)
CO2: 22 mmol/L (ref 22–32)
Calcium: 8.4 mg/dL — ABNORMAL LOW (ref 8.9–10.3)
Chloride: 94 mmol/L — ABNORMAL LOW (ref 98–111)
Creatinine, Ser: 9.07 mg/dL — ABNORMAL HIGH (ref 0.44–1.00)
GFR, Estimated: 5 mL/min — ABNORMAL LOW (ref >60)
Glucose, Bld: 243 mg/dL — ABNORMAL HIGH (ref 70–99)
Potassium: 7.3 mmol/L (ref 3.5–5.1)
Sodium: 134 mmol/L — ABNORMAL LOW (ref 135–145)
Total Bilirubin: 0.8 mg/dL (ref 0.3–1.2)
Total Protein: 10.5 g/dL — ABNORMAL HIGH (ref 6.5–8.1)

## 2022-06-10 LAB — CBC WITH DIFFERENTIAL/PLATELET
Abs Immature Granulocytes: 0.06 10*3/uL (ref 0.00–0.07)
Basophils Absolute: 0.1 10*3/uL (ref 0.0–0.1)
Basophils Relative: 1 %
Eosinophils Absolute: 0.2 10*3/uL (ref 0.0–0.5)
Eosinophils Relative: 2 %
HCT: 35.4 % — ABNORMAL LOW (ref 36.0–46.0)
Hemoglobin: 10.8 g/dL — ABNORMAL LOW (ref 12.0–15.0)
Immature Granulocytes: 1 %
Lymphocytes Relative: 21 %
Lymphs Abs: 2.1 10*3/uL (ref 0.7–4.0)
MCH: 30 pg (ref 26.0–34.0)
MCHC: 30.5 g/dL (ref 30.0–36.0)
MCV: 98.3 fL (ref 80.0–100.0)
Monocytes Absolute: 0.8 10*3/uL (ref 0.1–1.0)
Monocytes Relative: 8 %
Neutro Abs: 6.7 10*3/uL (ref 1.7–7.7)
Neutrophils Relative %: 67 %
Platelets: 227 10*3/uL (ref 150–400)
RBC: 3.6 MIL/uL — ABNORMAL LOW (ref 3.87–5.11)
RDW: 19.7 % — ABNORMAL HIGH (ref 11.5–15.5)
WBC: 9.9 10*3/uL (ref 4.0–10.5)
nRBC: 0 % (ref 0.0–0.2)

## 2022-06-10 LAB — ETHANOL: Alcohol, Ethyl (B): <10 mg/dL (ref <10)

## 2022-06-10 LAB — CBG MONITORING, ED
Glucose-Capillary: 231 mg/dL — ABNORMAL HIGH (ref 70–99)
Glucose-Capillary: 197 mg/dL — ABNORMAL HIGH (ref 70–99)

## 2022-06-10 MED ORDER — DEXTROSE 10 % IV SOLN: Freq: Once | INTRAVENOUS | Status: AC

## 2022-06-10 MED ORDER — INSULIN ASPART 100 UNIT/ML IV SOLN
10.0000 [IU] | Freq: Once | INTRAVENOUS | Status: AC
  Administered 2022-06-10: 10 [IU] via INTRAVENOUS

## 2022-06-10 MED ORDER — FUROSEMIDE 10 MG/ML IJ SOLN: 40.0000 mg | Freq: Once | INTRAMUSCULAR | Status: DC

## 2022-06-10 MED ORDER — LACTATED RINGERS IV BOLUS
1000.0000 mL | Freq: Once | INTRAVENOUS | Status: AC
  Administered 2022-06-10: 1000 mL via INTRAVENOUS

## 2022-06-10 MED ORDER — MECLIZINE HCL 25 MG PO TABS: 25.0000 mg | ORAL_TABLET | Freq: Three times a day (TID) | ORAL | 0 refills | Status: DC | PRN

## 2022-06-10 MED ORDER — MECLIZINE HCL 25 MG PO TABS
25.0000 mg | ORAL_TABLET | Freq: Once | ORAL | Status: AC
  Administered 2022-06-10: 25 mg via ORAL
  Filled 2022-06-10: qty 1

## 2022-06-10 MED ORDER — SODIUM BICARBONATE 8.4 % IV SOLN
50.0000 meq | Freq: Once | INTRAVENOUS | Status: AC
  Administered 2022-06-10: 50 meq via INTRAVENOUS
  Filled 2022-06-10: qty 50

## 2022-06-10 MED ORDER — CALCIUM GLUCONATE 10 % IV SOLN
1.0000 g | Freq: Once | INTRAVENOUS | Status: AC
  Administered 2022-06-10: 1 g via INTRAVENOUS
  Filled 2022-06-10: qty 10

## 2022-06-10 MED ORDER — SODIUM ZIRCONIUM CYCLOSILICATE 10 G PO PACK
10.0000 g | PACK | Freq: Once | ORAL | Status: AC
  Administered 2022-06-10: 10 g via ORAL
  Filled 2022-06-10: qty 1

## 2022-06-10 NOTE — ED Notes (Signed)
Discharge instructions given along with sandwich bag.  IV removed.    Pt states she is waiting on significant other to bring her some pants.

## 2022-06-10 NOTE — ED Triage Notes (Signed)
Pt arrives EMS from home with reports of feeling dizzy since 11pm tonight. Pt reports she feels tired. Pt reports taking melatonin and a pain pill and reports "I might have taken a half or a whole." Per EMS pt was hyperglycemic and takes insulin "sometimes." Pt on dialysis pt with appts on tues, thur, Saturday.

## 2022-06-10 NOTE — ED Provider Notes (Signed)
  Physical Exam  BP 111/80 (BP Location: Right Arm)   Pulse 85   Temp 98.9 F (37.2 C) (Oral)   Resp 13   Ht 5\' 6"  (1.676 m)   Wt 84 kg   SpO2 97%   BMI 29.89 kg/m    ED Course / MDM   Clinical Course as of 06/10/22 1556  Thu Jun 10, 2022  0722 Received sign out from Dr. Dayna Barker pending re-assessment after dialysis.  [WS]  [id number] Patient had dialysis.  She is no longer on oxygen.  She reports she feels well and at baseline.  Her dizziness has resolved after receiving dialysis.  She request food. Will discharge patient to home. All questions answered. Patient comfortable with plan of discharge. Return precautions discussed with patient and specified on the after visit summary.  [WS]    Clinical Course User Index [WS] Cristie Hem, MD   Medical Decision Making Amount and/or Complexity of Data Reviewed Labs: ordered.  Risk OTC drugs. Prescription drug management.         Cristie Hem, MD 06/10/22 1556

## 2022-06-10 NOTE — Procedures (Signed)
I was present at this dialysis session. I have reviewed the session itself and made appropriate changes.   Dialysis from ER for hyperkalemia. Likely return to ER after and DC from there. No other concerns.  Filed Weights   06/10/22 0730  Weight: 84 kg    Recent Labs  Lab 06/10/22 0227  NA 134*  K 7.3*  CL 94*  CO2 22  GLUCOSE 243*  BUN 45*  CREATININE 9.07*  CALCIUM 8.4*    Recent Labs  Lab 06/10/22 0227  WBC 9.9  NEUTROABS 6.7  HGB 10.8*  HCT 35.4*  MCV 98.3  PLT 227    Scheduled Meds: Continuous Infusions: PRN Meds:.   Santiago Bumpers,  MD 06/10/2022, 9:24 AM

## 2022-06-10 NOTE — Progress Notes (Signed)
   06/10/22 1253  Vitals  Temp 98.9 F (37.2 C)  Pulse Rate 85  Resp 13  BP 111/80  SpO2 97 %  O2 Device Nasal Cannula  Oxygen Therapy  O2 Flow Rate (L/min) 2 L/min  Post Treatment  Dialyzer Clearance Heavily streaked  Duration of HD Treatment -hour(s) 4 hour(s)  Liters Processed 63.6  Fluid Removed (mL) 3500 mL  Tolerated HD Treatment Yes  AVG/AVF Arterial Site Held (minutes) 7 minutes  AVG/AVF Venous Site Held (minutes) 7 minutes   Received patient in bed to unit.  Alert and oriented.  Informed consent signed and in chart.   TX duration: 4hrs  Patient tolerated well.  Transported back to the room  Alert, without acute distress.  Hand-off given to patient's nurse.   Access used: LUA AVF Access issues: none  Total UF removed: 3.5L Medication(s) given: none    [name]

## 2022-06-10 NOTE — ED Provider Notes (Signed)
[facility] Provider Note   CSN: [id number] Arrival date & time: 06/10/22  0146     History  Chief Complaint  Patient presents with   Dizziness   Hyperglycemia    [name] is a 37 y.o. female.  37 year old female with history of diabetes, end-stage renal disease on dialysis (Tuesday Thursday Saturday but last had it on Monday).  Presents the ER today with feeling of the room spinning.  Patient states that she had a drink of something that her friend gave her shortly after that it started.  Did not improve so she came here for further evaluation.  She states her blood sugar is high as well.  She states compliance with other medications.  No other associated symptoms. Did take melatonin and pain medicine earlier tonight. No etoh or other drugs.    Dizziness Hyperglycemia Associated symptoms: dizziness        Home Medications Prior to Admission medications   Medication Sig Start Date End Date Taking? Authorizing Provider  meclizine (ANTIVERT) 25 MG tablet Take 1 tablet (25 mg total) by mouth 3 (three) times daily as needed for dizziness. 06/10/22  Yes Samar Venneman, Corene Cornea, MD  acetaminophen (TYLENOL) 500 MG tablet Take 500-1,000 mg by mouth every 6 (six) hours as needed for moderate pain.    [provider]  Calcium Carbonate Antacid (CALCIUM CARBONATE, DOSED IN MG ELEMENTAL CALCIUM,) 1250 MG/5ML SUSP Take 5 mLs by mouth daily. 10/22/20   [provider]  cetirizine (ZYRTEC) 10 MG tablet Take 10 mg by mouth daily as needed for allergies.    [provider]  doxycycline (VIBRA-TABS) 100 MG tablet Take 1 tablet (100 mg total) by mouth 2 (two) times daily. 04/29/22   Newt Minion, MD  doxycycline (VIBRAMYCIN) 100 MG capsule Take 1 capsule (100 mg total) by mouth 2 (two) times daily. 04/09/22   Suzan Slick, NP  famotidine (PEPCID) 40 MG tablet Take 1 tablet (40 mg total) by mouth 2 (two) times daily as needed for  heartburn or indigestion. Patient taking differently: Take 40 mg by mouth daily. 01/01/22   Jennye Boroughs, MD  fluticasone (FLONASE) 50 MCG/ACT nasal spray Place 2 sprays into both nostrils daily as needed for allergies. 09/03/18   [name], MD  gabapentin (NEURONTIN) 600 MG tablet Take 600 mg by mouth 3 (three) times daily. 12/30/20   [provider]  HYDROcodone-acetaminophen (NORCO) 5-325 MG tablet Take 1 tablet by mouth every 6 (six) hours as needed. 04/09/22   Suzan Slick, NP  insulin aspart (NOVOLOG FLEXPEN) 100 UNIT/ML FlexPen Inject 0-15 Units into the skin in the morning, at noon, in the evening, and at bedtime. Sliding Scale insulin    [provider]  insulin degludec (TRESIBA) 100 UNIT/ML FlexTouch Pen Inject 5-10 Units into the skin daily. Patient taking differently: Inject 7 Units into the skin daily. 01/01/22   Jennye Boroughs, MD  lidocaine-prilocaine (EMLA) cream Apply 1 application topically daily as needed [facility]). 12/17/20   [provider]  loperamide (IMODIUM) 2 MG capsule Take 2 mg by mouth as needed for diarrhea or loose stools. 09/11/20   [provider]  metoprolol tartrate (LOPRESSOR) 25 MG tablet Take 1 tablet (25 mg total) by mouth daily as needed (for systolic blood pressure or 135 or above). 01/01/22   Jennye Boroughs, MD  omeprazole (PRILOSEC) 40 MG capsule Take 1 capsule (40 mg total) by mouth 2 (two) times daily before a  meal. Open capsule and place granules in water or applesauce to swallow 03/17/21   Gatha Mayer, MD  oxyCODONE-acetaminophen (PERCOCET/ROXICET) 5-325 MG tablet Take 1 tablet by mouth daily as needed for severe pain. 01/01/22   Jennye Boroughs, MD  RENVELA 2.4 g PACK Take 2.4 g by mouth 3 (three) times daily. 05/04/21   [provider]  simvastatin (ZOCOR) 20 MG tablet Take 20 mg by mouth at bedtime.    [provider]      Allergies    Diphenhydramine, Motrin [ibuprofen], Contrast media  [iodinated contrast media], Peanut-containing drug products, Shellfish allergy, Banana, Chlorhexidine, Ferrous sulfate, and Iron dextran    Review of Systems   Review of Systems  Neurological:  Positive for dizziness.    Physical Exam Updated Vital Signs BP 126/89   Pulse 93   Temp 98.4 F (36.9 C)   Resp 16   Ht 5\' 6"  (1.676 m)   SpO2 98%   BMI 28.08 kg/m  Physical Exam Vitals and nursing note reviewed.  Constitutional:      Appearance: She is well-developed.  HENT:     Head: Normocephalic and atraumatic.  Eyes:     Pupils: Pupils are equal, round, and reactive to light.  Cardiovascular:     Rate and Rhythm: Normal rate and regular rhythm.  Pulmonary:     Effort: No respiratory distress.     Breath sounds: No stridor.  Abdominal:     General: There is no distension.  Musculoskeletal:        General: No swelling or tenderness. Normal range of motion.     Cervical back: Normal range of motion.  Skin:    General: Skin is warm and dry.  Neurological:     Mental Status: She is alert.     ED Results / Procedures / Treatments   Labs (all labs ordered are listed, but only abnormal results are displayed) Labs Reviewed  CBC WITH DIFFERENTIAL/PLATELET - Abnormal; Notable for the following components:      Result Value   RBC 3.60 (*)    Hemoglobin 10.8 (*)    HCT 35.4 (*)    RDW 19.7 (*)    All other components within normal limits  COMPREHENSIVE METABOLIC PANEL - Abnormal; Notable for the following components:   Sodium 134 (*)    Potassium 7.3 (*)    Chloride 94 (*)    Glucose, Bld 243 (*)    BUN 45 (*)    Creatinine, Ser 9.07 (*)    Calcium 8.4 (*)    Total Protein 10.5 (*)    GFR, Estimated 5 (*)    Anion gap 18 (*)    All other components within normal limits  CBG MONITORING, ED - Abnormal; Notable for the following components:   Glucose-Capillary 231 (*)    All other components within normal limits  ETHANOL  URINALYSIS, ROUTINE W REFLEX MICROSCOPIC   RAPID URINE DRUG SCREEN, HOSP PERFORMED  GLUCOSE, RANDOM  HEPATITIS B SURFACE ANTIGEN  HEPATITIS B SURFACE ANTIBODY, QUANTITATIVE    EKG EKG Interpretation  Date/Time:  Thursday June 10 2022 01:50:06 EDT Ventricular Rate:  92 PR Interval:  154 QRS Duration: 93 QT Interval:  386 QTC Calculation: 478 R Axis:   84 Text Interpretation: Sinus rhythm Nonspecific T abnormalities, inferior leads similar ST changes (given lead placement)  compared to october 2023 Confirmed by Merrily Pew [id number]) on 06/10/2022 2:25:36 AM  Radiology No results found.  Procedures .Critical Care  Performed by:  Hasan Douse, Corene Cornea, MD Authorized by: Merrily Pew, MD   Critical care provider statement:    Critical care time (minutes):  30   Critical care was necessary to treat or prevent imminent or life-threatening deterioration of the following conditions:  Metabolic crisis   Critical care was time spent personally by me on the following activities:  Development of treatment plan with patient or surrogate, discussions with consultants, evaluation of patient's response to treatment, examination of patient, ordering and review of laboratory studies, ordering and review of radiographic studies, ordering and performing treatments and interventions, pulse oximetry, re-evaluation of patient's condition and review of old charts     Medications Ordered in ED Medications  insulin aspart (novoLOG) injection 10 Units (has no administration in time range)  lactated ringers bolus 1,000 mL (1,000 mLs Intravenous New Bag/Given 06/10/22 0239)  meclizine (ANTIVERT) tablet 25 mg (25 mg Oral Given 06/10/22 0237)  calcium gluconate inj 10% (1 g) URGENT USE ONLY! (1 g Intravenous Given 06/10/22 0400)  sodium zirconium cyclosilicate (LOKELMA) packet 10 g (10 g Oral Given 06/10/22 0401)  dextrose 10 % infusion ( Intravenous New Bag/Given 06/10/22 0408)  sodium bicarbonate injection 50 mEq (50 mEq Intravenous Given 06/10/22 0354)     ED Course/ Medical Decision Making/ A&P                             Medical Decision Making Amount and/or Complexity of Data Reviewed Labs: ordered.  Risk OTC drugs. Prescription drug management.   Describes vertigo-like symptoms some meclizine given.  EKG did show some peaked T waves however did not treat until potassium resulted at 7.3.  Went and gave calcium, bicarb, insulin, Lokelma.  Also discussed with nephrology for emergent dialysis.  Will likely return to the ED for repeat eval and likely able to be discharged if MS improved after having time to metabolize her medications. D/C paperwork done assuming improved symptoms after dialysis.    Final Clinical Impression(s) / ED Diagnoses Final diagnoses:  Hyperkalemia  Vertigo    Rx / DC Orders ED Discharge Orders          Ordered    meclizine (ANTIVERT) 25 MG tablet  3 times daily PRN        06/10/22 0433              Sadiq Mccauley, Corene Cornea, MD 06/10/22 [phone]

## 2022-06-28 ENCOUNTER — Ambulatory Visit (INDEPENDENT_AMBULATORY_CARE_PROVIDER_SITE_OTHER): Payer: 59 | Admitting: Orthopedic Surgery

## 2022-06-28 DIAGNOSIS — S98112A Complete traumatic amputation of left great toe, initial encounter: Secondary | ICD-10-CM

## 2022-06-28 DIAGNOSIS — Z89412 Acquired absence of left great toe: Secondary | ICD-10-CM

## 2022-06-30 ENCOUNTER — Ambulatory Visit (HOSPITAL_BASED_OUTPATIENT_CLINIC_OR_DEPARTMENT_OTHER): Payer: 59 | Admitting: General Surgery

## 2022-07-04 ENCOUNTER — Encounter: Payer: Self-pay | Admitting: Orthopedic Surgery

## 2022-07-04 NOTE — Progress Notes (Signed)
Office Visit Note   Patient: [name]           Date of Birth: 08/04/1985           MRN: [id number] Visit Date: 06/28/2022              Requested by: Fleet Contras, MD [address] [place],  [place] [address] PCP: Fleet Contras, MD  Chief Complaint  Patient presents with   Left Foot - Routine Post Op    04/02/2022 left GT amputation      HPI: Patient is a 37 year old woman who is 3 months status post left great toe amputation.  Patient has used Hydrofera Blue at the wound center patient states she is currently not going there.  Assessment & Plan: Visit Diagnoses:  1. Amputation of left great toe     Plan: Patient was provided a return to work note for 07/05/2022.  Follow-Up Instructions: Return in about 3 months (around 09/27/2022).   Ortho Exam  Patient is alert, oriented, no adenopathy, well-dressed, normal affect, normal respiratory effort. Examination the great toe amputation is well-healed she does have clawing of the lesser toes she has dorsiflexion 10 degrees past neutral and reviewed Achilles stretching.  She has onychomycotic nails that were trimmed x 3 on the right.  She has a strong palpable dorsalis pedis pulse bilaterally.  Imaging: No results found. No images are attached to the encounter.  Labs: Lab Results  Component Value Date   HGBA1C 9.1 (H) 03/30/2022   HGBA1C 9.2 (H) 12/28/2021   HGBA1C 8.0 (H) 05/30/2021   ESRSEDRATE 88 (H) 03/31/2022   ESRSEDRATE 100 (H) 03/29/2022   CRP 14.3 (H) 03/31/2022   CRP 19.2 (H) 03/29/2022   CRP 1.0 (H) 01/01/2022   REPTSTATUS 04/03/2022 FINAL 03/29/2022   REPTSTATUS 04/03/2022 FINAL 03/29/2022   GRAMSTAIN  10/28/2018    ABUNDANT WBC PRESENT, PREDOMINANTLY PMN FEW GRAM POSITIVE COCCI    CULT  03/29/2022    NO GROWTH 5 DAYS Performed at [facility], [address], [place], [place] [address]    CULT  03/29/2022    NO GROWTH 5 DAYS Performed at [facility], [address], [place], [place]  [address]    [facility] STAPHYLOCOCCUS AUREUS 06/09/2017     Lab Results  Component Value Date   ALBUMIN 4.2 06/10/2022   ALBUMIN 3.0 (L) 04/03/2022   ALBUMIN 3.9 03/29/2022    Lab Results  Component Value Date   MG 1.9 12/28/2021   MG 2.2 09/28/2021   MG 2.0 06/13/2021   Lab Results  Component Value Date   VD25OH 17 (L) 03/23/2022   VD25OH 13 (L) 04/17/2020   VD25OH 8.9 (L) 03/20/2018    No results found for: "PREALBUMIN"    Latest Ref Rng & Units 06/10/2022    2:27 AM 04/04/2022    2:52 AM 04/03/2022    8:33 AM  CBC EXTENDED  WBC 4.0 - 10.5 K/uL 9.9  7.1  5.8   RBC 3.87 - 5.11 MIL/uL 3.60  2.79  3.11   Hemoglobin 12.0 - 15.0 g/dL 40.1  8.6  9.4   HCT 02.7 - 46.0 % 35.4  27.6  31.3   Platelets 150 - 400 K/uL 227  201  215   NEUT# 1.7 - 7.7 K/uL 6.7  4.0    Lymph# 0.7 - 4.0 K/uL 2.1  2.3       There is no height or weight on file to calculate BMI.  Orders:  No  orders of the defined types were placed in this encounter.  No orders of the defined types were placed in this encounter.    Procedures: No procedures performed  Clinical Data: No additional findings.  ROS:  All other systems negative, except as noted in the HPI. Review of Systems  Objective: Vital Signs: There were no vitals taken for this visit.  Specialty Comments:  No specialty comments available.  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   Hiatal hernia 04/22/2020   Overweight (BMI 25.0-29.9) 04/22/2020   Uncontrolled type 1 diabetes mellitus with hyperglycemia 04/20/2020   GERD (gastroesophageal reflux disease) 04/20/2020   Wide-complex tachycardia    ESRD on hemodialysis 05/01/2018   Iron deficiency anemia, unspecified 04/03/2018   Complication of vascular dialysis catheter 03/27/2018   Kidney transplant failure 03/27/2018   Renal sclerosis, unspecified 03/27/2018   Hyperlipidemia 03/01/2018   Metabolic acidosis 02/03/2018   Incontinence of bowel 02/03/2018   Chronic pain 11/11/2017   Chronic cholecystitis 06/29/2017   Diabetes mellitus type 1 05/25/2015   Nausea and vomiting    Renal transplant recipient    Immunosuppressed status    ESRD (end stage renal disease) 09/30/2014   Sepsis 06/24/2014   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   Sleep-wake schedule disorder, 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   Chronic kidney disease 01/04/2010   OVARIAN FAILURE, PREMATURE 03/11/2009   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   OBESITY 09/25/2007   Anemia due to chronic kidney disease 09/25/2007   LEARNING DISABILITY 09/25/2007   Essential hypertension, benign 09/25/2007   Past Medical History:  Diagnosis Date   Anemia    Blood transfusion without reported diagnosis    Cellulitis of left leg 03/01/2018   Chronic kidney disease    kidney transplant 07   Diabetes mellitus    Pt reports diagnosis in June 2011, Type 2   Diabetes mellitus without complication    Esophageal obstruction due to food impaction    GERD (gastroesophageal reflux disease)    Hyperlipidemia    Hypertension    Intra-abdominal abscess 10/28/2018   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   LEARNING DISABILITY 09/25/2007   Qualifier: Diagnosis of  By: Dayton Martes MD, Talia     Nausea and vomiting    Prolonged Q-T interval on ECG    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   Pyelonephritis 06/23/2014    Renal and perinephric abscess 11/01/2018   Renal disorder    Seasonal allergies    Seizures    UTI (urinary tract infection) 01/09/2015   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   Qualifier: Diagnosis of  By: Sandi Mealy  MD, Judeth Cornfield      Family History  Problem Relation Age of Onset   Arthritis Mother    Hypertension Mother    Aneurysm Mother        died of brain aneurysm   CAD Father  Has 3 stents   Diabetes Father        borderline   Early death Brother        Died in war   Colon cancer Neg Hx    Esophageal cancer Neg Hx    Rectal cancer Neg Hx    Stomach cancer Neg Hx     Past Surgical History:  Procedure Laterality Date   AMPUTATION Left 04/02/2022   Procedure: AMPUTATION LEFT GREAT TOE;  Surgeon: Nadara Mustard, MD;  Location: [facility] OR;  Service: Orthopedics;  Laterality: Left;   ARTERIOVENOUS GRAFT PLACEMENT Bilateral    "neither work" (10/24/2017)   AV FISTULA PLACEMENT Left 10/26/2018   Procedure: CREATION OF ARTERIOVENOUS FISTULA  LEFT ARM;  Surgeon: Cephus Shelling, MD;  Location: [facility] OR;  Service: Vascular;  Laterality: Left;   AV FISTULA PLACEMENT Left 05/23/2020   Procedure: LEFT ARM ARTERIOVENOUS (AV) FISTULA CREATION;  Surgeon: Cephus Shelling, MD;  Location: [facility] OR;  Service: Vascular;  Laterality: Left;   BALLOON DILATION N/A 01/19/2021   Procedure: Marvis Repress DILATION;  Surgeon: Iva Boop, MD;  Location: [facility] ENDOSCOPY;  Service: Endoscopy;  Laterality: N/A;   BALLOON DILATION N/A 02/10/2021   Procedure: BALLOON DILATION;  Surgeon: Hilarie Fredrickson, MD;  Location: [facility] ENDOSCOPY;  Service: Endoscopy;  Laterality: N/A;   BALLOON DILATION N/A 02/19/2021   Procedure: BALLOON DILATION;  Surgeon: Imogene Burn, MD;  Location: [facility];  Service: Gastroenterology;  Laterality: N/A;   BASCILIC VEIN TRANSPOSITION Left 12/21/2018   Procedure: Left arm BASILIC VEIN TRANSPOSITION SECOND STAGE;  Surgeon: Cephus Shelling, MD;  Location: [facility] OR;  Service: Vascular;  Laterality:  Left;   BIOPSY  12/29/2021   Procedure: BIOPSY;  Surgeon: Meryl Dare, MD;  Location: [facility] ENDOSCOPY;  Service: Gastroenterology;;   CHOLECYSTECTOMY N/A 06/30/2017   Procedure: LAPAROSCOPIC CHOLECYSTECTOMY WITH INTRAOPERATIVE CHOLANGIOGRAM;  Surgeon: Glenna Fellows, MD;  Location: [facility] ORS;  Service: General;  Laterality: N/A;   ESOPHAGOGASTRODUODENOSCOPY (EGD) WITH PROPOFOL N/A 07/04/2017   Procedure: ESOPHAGOGASTRODUODENOSCOPY (EGD) WITH PROPOFOL;  Surgeon: Vida Rigger, MD;  Location: [facility] ENDOS[facility];  Service: Endoscopy;  Laterality: N/A;   ESOPHAGOGASTRODUODENOSCOPY (EGD) WITH PROPOFOL N/A 08/10/2020   Procedure: ESOPHAGOGASTRODUODENOSCOPY (EGD) WITH PROPOFOL;  Surgeon: Sherrilyn Rist, MD;  Location: [facility] ENDOSCOPY;  Service: Gastroenterology;  Laterality: N/A;   ESOPHAGOGASTRODUODENOSCOPY (EGD) WITH PROPOFOL N/A 01/19/2021   Procedure: ESOPHAGOGASTRODUODENOSCOPY (EGD) WITH PROPOFOL;  Surgeon: Iva Boop, MD;  Location: [facility];  Service: Endoscopy;  Laterality: N/A;  WITH FLUOROSCOPY AND DILATION   ESOPHAGOGASTRODUODENOSCOPY (EGD) WITH PROPOFOL N/A 02/03/2021   Procedure: ESOPHAGOGASTRODUODENOSCOPY (EGD) WITH PROPOFOL;  Surgeon: Iva Boop, MD;  Location: [facility];  Service: Endoscopy;  Laterality: N/A;   ESOPHAGOGASTRODUODENOSCOPY (EGD) WITH PROPOFOL N/A 02/10/2021   Procedure: ESOPHAGOGASTRODUODENOSCOPY (EGD) WITH PROPOFOL;  Surgeon: Hilarie Fredrickson, MD;  Location: [facility] ENDOS[facility];  Service: Endoscopy;  Laterality: N/A;  Balloon Dilation   ESOPHAGOGASTRODUODENOSCOPY (EGD) WITH PROPOFOL N/A 02/19/2021   Procedure: ESOPHAGOGASTRODUODENOSCOPY (EGD) WITH PROPOFOL;  Surgeon: Imogene Burn, MD;  Location: [facility];  Service: Gastroenterology;  Laterality: N/A;   ESOPHAGOGASTRODUODENOSCOPY (EGD) WITH PROPOFOL N/A 03/12/2021   Procedure: ESOPHAGOGASTRODUODENOSCOPY (EGD) WITH PROPOFOL;  Surgeon: Shellia Cleverly, DO;  Location: [facility];  Service: Gastroenterology;   Laterality: N/A;   ESOPHAGOGASTRODUODENOSCOPY (EGD) WITH PROPOFOL N/A 12/29/2021   Procedure: ESOPHAGOGASTRODUODENOSCOPY (EGD) WITH PROPOFOL;  Surgeon: Meryl Dare, MD;  Location: [facility] ENDOSCOPY;  Service: Gastroenterology;  Laterality: N/A;   FLEXIBLE SIGMOIDOSCOPY N/A  12/29/2021   Procedure: FLEXIBLE SIGMOIDOSCOPY;  Surgeon: Meryl Dare, MD;  Location: [facility] ENDOSCOPY;  Service: Gastroenterology;  Laterality: N/A;   FOREIGN BODY REMOVAL N/A 02/03/2021   Procedure: FOREIGN BODY REMOVAL;  Surgeon: Iva Boop, MD;  Location: [facility] ENDOSCOPY;  Service: Endoscopy;  Laterality: N/A;   INSERTION OF DIALYSIS CATHETER N/A 03/20/2018   Procedure: INSERTION OF TUNNELED DIALYSIS CATHETER - RIGHT INTERANL JUGULAR PLACEMENT;  Surgeon: Chuck Hint, MD;  Location: [facility] OR;  Service: Vascular;  Laterality: N/A;   IR FLUORO GUIDE CV LINE RIGHT  04/18/2020   IR GUIDED DRAIN W CATHETER PLACEMENT  10/28/2018   KIDNEY TRANSPLANT  2007   KIDNEY TRANSPLANT Right    PARATHYROIDECTOMY  ?2012   "3/4 removed" (10/24/2017)   RENAL BIOPSY Bilateral 2003   REVISON OF ARTERIOVENOUS FISTULA Left 09/24/2020   Procedure: LEFT UPPER ARM ARTERIOVENOUS GRAFT CREATION;  Surgeon: Cephus Shelling, MD;  Location: [facility] OR;  Service: Vascular;  Laterality: Left;   UPPER EXTREMITY VENOGRAPHY Bilateral 10/19/2018   Procedure: UPPER EXTREMITY VENOGRAPHY;  Surgeon: Cephus Shelling, MD;  Location: [facility] INVASIVE CV LAB;  Service: Cardiovascular;  Laterality: Bilateral;  Bilateral    UPPER EXTREMITY VENOGRAPHY Left 09/04/2020   Procedure: UPPER EXTREMITY VENOGRAPHY - Left Upper;  Surgeon: Cephus Shelling, MD;  Location: [facility] INVASIVE CV LAB;  Service: Cardiovascular;  Laterality: Left;   Social History   Occupational History   Occupation: Conservation officer, 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

## 2022-07-07 ENCOUNTER — Telehealth: Payer: Self-pay | Admitting: Orthopedic Surgery

## 2022-07-07 NOTE — Telephone Encounter (Signed)
Patient need P/T for back and legs. Please call patient

## 2022-08-05 ENCOUNTER — Other Ambulatory Visit (HOSPITAL_COMMUNITY): Payer: Self-pay | Admitting: Internal Medicine

## 2022-08-05 ENCOUNTER — Ambulatory Visit (HOSPITAL_COMMUNITY)
Admission: RE | Admit: 2022-08-05 | Discharge: 2022-08-05 | Disposition: A | Discharge status: Home / Self Care | Payer: 59 | Source: Ambulatory Visit | Attending: Vascular Surgery | Admitting: Vascular Surgery

## 2022-08-05 DIAGNOSIS — I739 Peripheral vascular disease, unspecified: Secondary | ICD-10-CM

## 2022-08-05 DIAGNOSIS — S98112A Complete traumatic amputation of left great toe, initial encounter: Secondary | ICD-10-CM

## 2022-08-05 LAB — VAS US ABI WITH/WO TBI
Left ABI: 1.08
Right ABI: 1.21

## 2022-09-25 ENCOUNTER — Emergency Department (HOSPITAL_COMMUNITY)
Admission: EM | Admit: 2022-09-25 | Discharge: 2022-09-25 | Disposition: A | Discharge status: Home / Self Care | Payer: 59 | Attending: Emergency Medicine | Admitting: Emergency Medicine

## 2022-09-25 ENCOUNTER — Encounter (HOSPITAL_COMMUNITY): Payer: Self-pay

## 2022-09-25 ENCOUNTER — Emergency Department (HOSPITAL_COMMUNITY): Payer: 59

## 2022-09-25 ENCOUNTER — Other Ambulatory Visit: Payer: Self-pay

## 2022-09-25 DIAGNOSIS — Z8616 Personal history of COVID-19: Secondary | ICD-10-CM | POA: Diagnosis not present

## 2022-09-25 DIAGNOSIS — Z992 Dependence on renal dialysis: Secondary | ICD-10-CM | POA: Insufficient documentation

## 2022-09-25 DIAGNOSIS — N186 End stage renal disease: Secondary | ICD-10-CM | POA: Insufficient documentation

## 2022-09-25 DIAGNOSIS — Z794 Long term (current) use of insulin: Secondary | ICD-10-CM | POA: Diagnosis not present

## 2022-09-25 DIAGNOSIS — R112 Nausea with vomiting, unspecified: Secondary | ICD-10-CM | POA: Insufficient documentation

## 2022-09-25 DIAGNOSIS — E1165 Type 2 diabetes mellitus with hyperglycemia: Secondary | ICD-10-CM | POA: Diagnosis not present

## 2022-09-25 DIAGNOSIS — Z1152 Encounter for screening for COVID-19: Secondary | ICD-10-CM | POA: Insufficient documentation

## 2022-09-25 DIAGNOSIS — M542 Cervicalgia: Secondary | ICD-10-CM | POA: Insufficient documentation

## 2022-09-25 DIAGNOSIS — M546 Pain in thoracic spine: Secondary | ICD-10-CM | POA: Insufficient documentation

## 2022-09-25 DIAGNOSIS — Z79899 Other long term (current) drug therapy: Secondary | ICD-10-CM | POA: Insufficient documentation

## 2022-09-25 DIAGNOSIS — Z91010 Allergy to peanuts: Secondary | ICD-10-CM | POA: Insufficient documentation

## 2022-09-25 DIAGNOSIS — E1122 Type 2 diabetes mellitus with diabetic chronic kidney disease: Secondary | ICD-10-CM | POA: Diagnosis not present

## 2022-09-25 DIAGNOSIS — R519 Headache, unspecified: Secondary | ICD-10-CM | POA: Insufficient documentation

## 2022-09-25 DIAGNOSIS — I120 Hypertensive chronic kidney disease with stage 5 chronic kidney disease or end stage renal disease: Secondary | ICD-10-CM | POA: Insufficient documentation

## 2022-09-25 DIAGNOSIS — R197 Diarrhea, unspecified: Secondary | ICD-10-CM | POA: Diagnosis not present

## 2022-09-25 DIAGNOSIS — Z20822 Contact with and (suspected) exposure to covid-19: Secondary | ICD-10-CM | POA: Insufficient documentation

## 2022-09-25 DIAGNOSIS — L905 Scar conditions and fibrosis of skin: Secondary | ICD-10-CM | POA: Diagnosis not present

## 2022-09-25 DIAGNOSIS — R1084 Generalized abdominal pain: Secondary | ICD-10-CM | POA: Insufficient documentation

## 2022-09-25 LAB — COMPREHENSIVE METABOLIC PANEL
ALT: 17 U/L (ref 0–44)
AST: 20 U/L (ref 15–41)
Albumin: 4.6 g/dL (ref 3.5–5.0)
Alkaline Phosphatase: 61 U/L (ref 38–126)
Anion gap: 17 — ABNORMAL HIGH (ref 5–15)
BUN: 34 mg/dL — ABNORMAL HIGH (ref 6–20)
CO2: 27 mmol/L (ref 22–32)
Calcium: 9.5 mg/dL (ref 8.9–10.3)
Chloride: 93 mmol/L — ABNORMAL LOW (ref 98–111)
Creatinine, Ser: 9.07 mg/dL — ABNORMAL HIGH (ref 0.44–1.00)
GFR, Estimated: 5 mL/min — ABNORMAL LOW (ref >60)
Glucose, Bld: 291 mg/dL — ABNORMAL HIGH (ref 70–99)
Potassium: 3.9 mmol/L (ref 3.5–5.1)
Sodium: 137 mmol/L (ref 135–145)
Total Bilirubin: 0.8 mg/dL (ref 0.3–1.2)
Total Protein: 9.8 g/dL — ABNORMAL HIGH (ref 6.5–8.1)

## 2022-09-25 LAB — RESP PANEL BY RT-PCR (RSV, FLU A&B, COVID)  RVPGX2
Influenza A by PCR: NEGATIVE
Influenza B by PCR: NEGATIVE
Resp Syncytial Virus by PCR: NEGATIVE
SARS Coronavirus 2 by RT PCR: NEGATIVE

## 2022-09-25 LAB — CBC
HCT: 38.3 % (ref 36.0–46.0)
Hemoglobin: 12.3 g/dL (ref 12.0–15.0)
MCH: 30.4 pg (ref 26.0–34.0)
MCHC: 32.1 g/dL (ref 30.0–36.0)
MCV: 94.6 fL (ref 80.0–100.0)
Platelets: 173 10*3/uL (ref 150–400)
RBC: 4.05 MIL/uL (ref 3.87–5.11)
RDW: 18.4 % — ABNORMAL HIGH (ref 11.5–15.5)
WBC: 6.5 10*3/uL (ref 4.0–10.5)
nRBC: 0 % (ref 0.0–0.2)

## 2022-09-25 LAB — HCG, SERUM, QUALITATIVE: Preg, Serum: NEGATIVE

## 2022-09-25 LAB — LIPASE, BLOOD: Lipase: 54 U/L — ABNORMAL HIGH (ref 11–51)

## 2022-09-25 LAB — CBG MONITORING, ED: Glucose-Capillary: 271 mg/dL — ABNORMAL HIGH (ref 70–99)

## 2022-09-25 MED ORDER — PROCHLORPERAZINE EDISYLATE 10 MG/2ML IJ SOLN: 10.0000 mg | Freq: Once | INTRAMUSCULAR | Status: DC

## 2022-09-25 MED ORDER — PROCHLORPERAZINE EDISYLATE 10 MG/2ML IJ SOLN
10.0000 mg | Freq: Once | INTRAMUSCULAR | Status: AC
  Administered 2022-09-25: 10 mg via INTRAMUSCULAR
  Filled 2022-09-25: qty 2

## 2022-09-25 MED ORDER — HYDROMORPHONE HCL 1 MG/ML IJ SOLN
0.5000 mg | Freq: Once | INTRAMUSCULAR | Status: AC
  Administered 2022-09-25: 0.5 mg via INTRAVENOUS
  Filled 2022-09-25: qty 1

## 2022-09-25 MED ORDER — MORPHINE SULFATE (PF) 4 MG/ML IV SOLN
4.0000 mg | Freq: Once | INTRAVENOUS | Status: AC
  Administered 2022-09-25: 4 mg via INTRAVENOUS
  Filled 2022-09-25: qty 1

## 2022-09-25 MED ORDER — FAMOTIDINE IN NACL 20-0.9 MG/50ML-% IV SOLN
20.0000 mg | Freq: Once | INTRAVENOUS | Status: AC
  Administered 2022-09-25: 20 mg via INTRAVENOUS
  Filled 2022-09-25: qty 50

## 2022-09-25 MED ORDER — SODIUM CHLORIDE 0.9 % IV BOLUS
1000.0000 mL | Freq: Once | INTRAVENOUS | Status: AC
  Administered 2022-09-25: 1000 mL via INTRAVENOUS

## 2022-09-25 NOTE — Discharge Instructions (Signed)
Return for any problem.  ?

## 2022-09-25 NOTE — ED Provider Notes (Signed)
[place] EMERGENCY DEPARTMENT AT [facility] Provider Note  CSN: [id number] Arrival date & time: 09/25/22 4098  Chief Complaint(s) Abdominal Pain  HPI [name] is a 37 y.o. female with extensive past medical history listed below including hypertension, diabetes, ESRD on dialysis TTS who has not missed any dialysis sessions who presents to the emergency department with 12 hours of generalized abdominal pain, back pain and headache with nausea nonbloody nonbilious emesis and loose bowel movements.  She reports fever at home of 100.2.  Denies any known sick contacts.  No suspicious food intake.  She does not make urine.  The history is provided by the patient.    Past Medical History Past Medical History:  Diagnosis Date   Anemia    Blood transfusion without reported diagnosis    Cellulitis of left leg 03/01/2018   Chronic kidney disease    kidney transplant 07   Diabetes mellitus    Pt reports diagnosis in June 2011, Type 2   Diabetes mellitus without complication (HCC)    Esophageal obstruction due to food impaction    GERD (gastroesophageal reflux disease)    Hyperlipidemia    Hypertension    Intra-abdominal abscess (HCC) 10/28/2018   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   LEARNING DISABILITY 09/25/2007   Qualifier: Diagnosis of  By: Dayton Martes MD, Talia     Nausea and vomiting    Prolonged Q-T interval on ECG    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   Pyelonephritis 06/23/2014   Renal and perinephric abscess 11/01/2018   Renal disorder    Seasonal allergies    Seizures (HCC)    UTI (urinary tract infection) 01/09/2015   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   Qualifier: Diagnosis of  By: Sandi Mealy  MD, Judeth Cornfield     Patient Active Problem List   Diagnosis Date Noted   Open wound of left foot 04/02/2022   Osteomyelitis of great toe of left foot (HCC) 03/30/2022   Diabetic foot ulcer (HCC) 03/29/2022   Abdominal pain, epigastric    Abnormal CT scan, colon    Hematemesis  with nausea 12/28/2021   COVID-19 virus infection 12/27/2021   Liver lesion 12/27/2021   GI bleeding 12/26/2021   Esophageal dysphagia    Esophageal stricture    Seizures (HCC) 10/30/2020   [place] grade D esophagitis 08/09/2020   DKA, type 2 (HCC) 07/31/2020   Polyneuropathy associated with underlying disease (HCC) 04/29/2020   Renal osteodystrophy 04/22/2020   Hiatal hernia 04/22/2020   Overweight (BMI 25.0-29.9) 04/22/2020   Uncontrolled type 1 diabetes mellitus with hyperglycemia (HCC) 04/20/2020   GERD (gastroesophageal reflux disease) 04/20/2020   Wide-complex tachycardia    ESRD on hemodialysis (HCC) 05/01/2018   Iron deficiency anemia, unspecified 04/03/2018   Complication of vascular dialysis catheter 03/27/2018   Kidney transplant failure 03/27/2018   Renal sclerosis, unspecified 03/27/2018   Hyperlipidemia 03/01/2018   Metabolic acidosis 02/03/2018   Incontinence of bowel 02/03/2018   Chronic pain 11/11/2017   Chronic cholecystitis 06/29/2017   Diabetes mellitus type 1 (HCC) 05/25/2015   Nausea and vomiting    Renal transplant recipient    Immunosuppressed status (HCC)    ESRD (end stage renal disease) (HCC) 09/30/2014   Sepsis (HCC) 06/24/2014   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 Authorizing Provider  acetaminophen (TYLENOL) 500 MG tablet Take 500-1,000 mg by mouth every 6 (six) hours as needed for moderate pain.    [provider]  Calcium Carbonate Antacid (CALCIUM CARBONATE, DOSED IN MG  ELEMENTAL CALCIUM,) 1250 MG/5ML SUSP Take 5 mLs by mouth daily. 10/22/20   [provider]  cetirizine (ZYRTEC) 10 MG tablet Take 10 mg by mouth daily as needed for allergies.    [provider]  doxycycline (VIBRA-TABS) 100 MG tablet Take 1 tablet (100 mg total) by mouth 2 (two) times daily. 04/29/22   Nadara Mustard, MD  doxycycline (VIBRAMYCIN) 100 MG capsule Take 1 capsule (100 mg total) by mouth 2 (two) times daily. 04/09/22   Adonis Huguenin, NP  famotidine (PEPCID) 40 MG tablet Take 1 tablet (40 mg total) by mouth 2 (two) times daily as needed for heartburn or indigestion. Patient taking differently: Take 40 mg by mouth daily. 01/01/22   Lurene Shadow, MD  fluticasone (FLONASE) 50 MCG/ACT nasal spray Place 2 sprays into both nostrils daily as needed for allergies. 09/03/18   Glade Lloyd, MD  gabapentin (NEURONTIN) 600 MG tablet Take 600 mg by mouth 3 (three) times daily. 12/30/20   [provider]  HYDROcodone-acetaminophen (NORCO) 5-325 MG tablet Take 1 tablet by mouth every 6 (six) hours as needed. 04/09/22   Adonis Huguenin, NP  insulin aspart (NOVOLOG FLEXPEN) 100 UNIT/ML FlexPen Inject 0-15 Units into the skin in the morning, at noon, in the evening, and at bedtime. Sliding Scale insulin    [provider]  insulin degludec (TRESIBA) 100 UNIT/ML FlexTouch Pen Inject 5-10 Units into the skin daily. Patient taking differently: Inject 7 Units into the skin daily. 01/01/22   Lurene Shadow, MD  lidocaine-prilocaine (EMLA) cream Apply 1 application topically daily as needed [facility]). 12/17/20   [provider]  loperamide (IMODIUM) 2 MG capsule Take 2 mg by mouth as needed for diarrhea or loose stools. 09/11/20   [provider]  meclizine (ANTIVERT) 25 MG tablet Take 1 tablet (25 mg total) by mouth 3 (three) times daily as needed for dizziness. 06/10/22   Mesner, Barbara Cower, MD  metoprolol tartrate (LOPRESSOR) 25 MG tablet Take 1 tablet (25 mg total) by  mouth daily as needed (for systolic blood pressure or 135 or above). 01/01/22   Lurene Shadow, MD  omeprazole (PRILOSEC) 40 MG capsule Take 1 capsule (40 mg total) by mouth 2 (two) times daily before a meal. Open capsule and place granules in water or applesauce to swallow 03/17/21   Iva Boop, MD  oxyCODONE-acetaminophen (PERCOCET/ROXICET) 5-325 MG tablet Take 1 tablet by mouth daily as needed for severe pain. 01/01/22   Lurene Shadow, MD  RENVELA 2.4 g PACK Take 2.4 g by mouth 3 (three) times daily. 05/04/21   [provider]  simvastatin (ZOCOR) 20 MG tablet Take 20 mg by mouth at bedtime.    [provider]  Allergies Diphenhydramine, Motrin [ibuprofen], Peanut-containing drug products, Contrast media [iodinated contrast media], Shellfish allergy, Banana, Chlorhexidine, Ferrous sulfate, and Iron dextran  Review of Systems Review of Systems As noted in HPI  Physical Exam Vital Signs  I have reviewed the triage vital signs BP (!) 183/118   Pulse 95   Temp 98.3 F (36.8 C)   Resp 16   SpO2 99%   Physical Exam Vitals reviewed.  Constitutional:      General: She is not in acute distress.    Appearance: She is well-developed. She is not diaphoretic.  HENT:     Head: Normocephalic and atraumatic.     Nose: Nose normal.  Eyes:     General: No scleral icterus.       Right eye: No discharge.        Left eye: No discharge.     Conjunctiva/sclera: Conjunctivae normal.     Pupils: Pupils are equal, round, and reactive to light.  Cardiovascular:     Rate and Rhythm: Normal rate and regular rhythm.     Heart sounds: No murmur heard.    No friction rub. No gallop.  Pulmonary:     Effort: Pulmonary effort is normal. No respiratory distress.     Breath sounds: Normal breath sounds. No stridor. No rales.  Abdominal:     General: A  surgical scar is present. There is no distension.     Palpations: Abdomen is soft.     Tenderness: There is generalized abdominal tenderness.  Musculoskeletal:     Cervical back: Normal range of motion and neck supple. Tenderness present.     Thoracic back: Spasms present.  Skin:    General: Skin is warm and dry.     Findings: No erythema or rash.  Neurological:     Mental Status: She is alert and oriented to person, place, and time.    ED Results and Treatments Labs (all labs ordered are listed, but only abnormal results are displayed) Labs Reviewed  LIPASE, BLOOD - Abnormal; Notable for the following components:      Result Value   Lipase 54 (*)    All other components within normal limits  COMPREHENSIVE METABOLIC PANEL - Abnormal; Notable for the following components:   Chloride 93 (*)    Glucose, Bld 291 (*)    BUN 34 (*)    Creatinine, Ser 9.07 (*)    Total Protein 9.8 (*)    GFR, Estimated 5 (*)    Anion gap 17 (*)    All other components within normal limits  CBC - Abnormal; Notable for the following components:   RDW 18.4 (*)    All other components within normal limits  CBG MONITORING, ED - Abnormal; Notable for the following components:   Glucose-Capillary 271 (*)    All other components within normal limits  RESP PANEL BY RT-PCR (RSV, FLU A&B, COVID)  RVPGX2  HCG, SERUM, QUALITATIVE  EKG  EKG Interpretation Date/Time:    Ventricular Rate:    PR Interval:    QRS Duration:    QT Interval:    QTC Calculation:   R Axis:      Text Interpretation:         Radiology No results found.  Medications Ordered in ED Medications  sodium chloride 0.9 % bolus 1,000 mL (has no administration in time range)  HYDROmorphone (DILAUDID) injection 0.5 mg (has no administration in time range)  prochlorperazine (COMPAZINE) injection 10 mg (has no  administration in time range)   Procedures Procedures  (including critical care time) Medical Decision Making / ED Course   Medical Decision Making Amount and/or Complexity of Data Reviewed Labs: ordered. Decision-making details documented in ED Course. Radiology: ordered.  Risk Prescription drug management.    Patient with generalized pain associated with nausea and nonbloody nonbilious emesis. CBC without leukocytosis or anemia CMP without significant electrolyte derangements.  Hyperglycemia without DKA.  Baseline renal function.  No biliary obstruction or pancreatitis. COVID/influenza/RSV negative.  Patient is hard stick for IV.  Given IM Compazine in the interim.  Getting CT of the head, abdomen and pelvis.   Patient care turned over to oncoming provider. Patient case and results discussed in detail; please see their note for further ED managment.       Final Clinical Impression(s) / ED Diagnoses Final diagnoses:  None    This chart was dictated using voice recognition software.  Despite best efforts to proofread,  errors can occur which can change the documentation meaning.    Nira Conn, MD 09/25/22 [phone]

## 2022-09-25 NOTE — ED Notes (Signed)
Patient's saturations were in the 80's and so I placed a nasal cannula with 2L oxygen.   Provide patient with some food for PO trial at provider's request

## 2022-09-25 NOTE — ED Notes (Signed)
Pt is waiting to be picked up by her friend

## 2022-09-25 NOTE — ED Notes (Signed)
Pt ate a little bit of apple sauce and drank some ginger ale.

## 2022-09-25 NOTE — ED Triage Notes (Signed)
Pt. Arrives POV c/o abdominal pain and vomiting x2 days. Pt. States that she has tried to take her pain meds, but she has been unable to keep them down. Pt. Is a dialysis patient and is unable to urinate.

## 2022-09-25 NOTE — ED Notes (Signed)
She asked me to call her family to let them know that she is in the hospital. I called her sister Eather Colas and put her on the phone with her.

## 2022-09-25 NOTE — ED Provider Notes (Addendum)
Patient seen after prior EDP.  Workup on the whole is without significant abnormality.  On reevaluation the patient is sleeping comfortably.  Patient denies current pain.  Will try p.o.  Patient taking p.o. well.  Patient reports feeling improved.  She desires discharge.  Importance of close follow-up stressed.  Strict return precautions given and understood.   Wynetta Fines, MD 09/25/22 [phone]    Wynetta Fines, MD 09/25/22 1249

## 2022-09-27 ENCOUNTER — Ambulatory Visit: Payer: 59 | Admitting: Orthopedic Surgery

## 2022-09-28 ENCOUNTER — Ambulatory Visit: Payer: 59 | Admitting: Orthopedic Surgery

## 2022-10-07 ENCOUNTER — Ambulatory Visit: Payer: 59 | Admitting: Orthopedic Surgery

## 2022-10-19 ENCOUNTER — Ambulatory Visit: Payer: 59 | Admitting: Orthopedic Surgery

## 2022-11-01 ENCOUNTER — Ambulatory Visit: Payer: 59 | Admitting: Orthopedic Surgery

## 2022-11-01 DIAGNOSIS — Z89412 Acquired absence of left great toe: Secondary | ICD-10-CM

## 2022-11-01 DIAGNOSIS — S98112A Complete traumatic amputation of left great toe, initial encounter: Secondary | ICD-10-CM

## 2022-11-02 ENCOUNTER — Encounter: Payer: Self-pay | Admitting: Orthopedic Surgery

## 2022-11-02 NOTE — Progress Notes (Signed)
Office Visit Note   Patient: [name]           Date of Birth: June 18, 1985           MRN: [id number] Visit Date: 11/01/2022              Requested by: Fleet Contras, MD [address] [place],  [place] [address] PCP: Fleet Contras, MD  Chief Complaint  Patient presents with   Left Foot - Follow-up    04/02/2022 left GT amputation      HPI: Patient is a 37 year old woman who is status post left great toe amputation approximately 7 months ago.  Assessment & Plan: Visit Diagnoses:  1. Amputation of left great toe (HCC)     Plan: Recommended Achilles stretching to unload the forefoot as well as stiff soled sneakers.  Follow-Up Instructions: Return if symptoms worsen or fail to improve.   Ortho Exam  Patient is alert, oriented, no adenopathy, well-dressed, normal affect, normal respiratory effort. Examination the incision is well-healed she has fixed clawing of the lesser toes there is a palpable dorsalis pedis pulse dorsiflexion only to neutral.  There are no ulcers on the lesser toes.  Imaging: No results found. No images are attached to the encounter.  Labs: Lab Results  Component Value Date   HGBA1C 9.1 (H) 03/30/2022   HGBA1C 9.2 (H) 12/28/2021   HGBA1C 8.0 (H) 05/30/2021   ESRSEDRATE 88 (H) 03/31/2022   ESRSEDRATE 100 (H) 03/29/2022   CRP 14.3 (H) 03/31/2022   CRP 19.2 (H) 03/29/2022   CRP 1.0 (H) 01/01/2022   REPTSTATUS 04/03/2022 FINAL 03/29/2022   REPTSTATUS 04/03/2022 FINAL 03/29/2022   GRAMSTAIN  10/28/2018    ABUNDANT WBC PRESENT, PREDOMINANTLY PMN FEW GRAM POSITIVE COCCI    CULT  03/29/2022    NO GROWTH 5 DAYS Performed at [facility], [address] [place], [place] [address]    CULT  03/29/2022    NO GROWTH 5 DAYS Performed at [facility], [address] [place], [place] [address]    [facility] STAPHYLOCOCCUS AUREUS 06/09/2017     Lab Results  Component Value Date   ALBUMIN 4.6 09/25/2022   ALBUMIN 4.2 06/10/2022   ALBUMIN 3.0  (L) 04/03/2022    Lab Results  Component Value Date   MG 1.9 12/28/2021   MG 2.2 09/28/2021   MG 2.0 06/13/2021   Lab Results  Component Value Date   VD25OH 17 (L) 03/23/2022   VD25OH 13 (L) 04/17/2020   VD25OH 8.9 (L) 03/20/2018    No results found for: "PREALBUMIN"    Latest Ref Rng & Units 09/25/2022    4:50 AM 06/10/2022    2:27 AM 04/04/2022    2:52 AM  CBC EXTENDED  WBC 4.0 - 10.5 K/uL 6.5  9.9  7.1   RBC 3.87 - 5.11 MIL/uL 4.05  3.60  2.79   Hemoglobin 12.0 - 15.0 g/dL 02.7  25.3  8.6   HCT 66.4 - 46.0 % 38.3  35.4  27.6   Platelets 150 - 400 K/uL 173  227  201   NEUT# 1.7 - 7.7 K/uL  6.7  4.0   Lymph# 0.7 - 4.0 K/uL  2.1  2.3      There is no height or weight on file to calculate BMI.  Orders:  No orders of the defined types were placed in this encounter.  No orders of the defined types were placed in this encounter.    Procedures: No procedures performed  Clinical Data: No additional findings.  ROS:  All other systems negative, except as noted in the HPI. Review of Systems  Objective: Vital Signs: There were no vitals taken for this visit.  Specialty Comments:  No specialty comments available.  PMFS History: Patient Active Problem List   Diagnosis Date Noted   Open wound of left foot 04/02/2022   Osteomyelitis of great toe of left foot (HCC) 03/30/2022   Diabetic foot ulcer (HCC) 03/29/2022   Abdominal pain, epigastric    Abnormal CT scan, colon    Hematemesis with nausea 12/28/2021   COVID-19 virus infection 12/27/2021   Liver lesion 12/27/2021   GI bleeding 12/26/2021   Esophageal dysphagia    Esophageal stricture    Seizures (HCC) 10/30/2020   [place] grade D esophagitis 08/09/2020   DKA, type 2 (HCC) 07/31/2020   Polyneuropathy associated with underlying disease (HCC) 04/29/2020   Renal osteodystrophy 04/22/2020   Hiatal hernia 04/22/2020   Overweight (BMI 25.0-29.9) 04/22/2020   Uncontrolled type 1 diabetes mellitus with  hyperglycemia (HCC) 04/20/2020   GERD (gastroesophageal reflux disease) 04/20/2020   Wide-complex tachycardia    ESRD on hemodialysis (HCC) 05/01/2018   Iron deficiency anemia, unspecified 04/03/2018   Complication of vascular dialysis catheter 03/27/2018   Kidney transplant failure 03/27/2018   Renal sclerosis, unspecified 03/27/2018   Hyperlipidemia 03/01/2018   Metabolic acidosis 02/03/2018   Incontinence of bowel 02/03/2018   Chronic pain 11/11/2017   Chronic cholecystitis 06/29/2017   Diabetes mellitus type 1 (HCC) 05/25/2015   Nausea and vomiting    Renal transplant recipient    Immunosuppressed status (HCC)    ESRD (end stage renal disease) (HCC) 09/30/2014   Sepsis (HCC) 06/24/2014   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   Sleep-wake schedule disorder, 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   Chronic kidney disease 01/04/2010   OVARIAN FAILURE, PREMATURE 03/11/2009   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   OBESITY 09/25/2007   Anemia due to chronic kidney disease 09/25/2007   LEARNING DISABILITY 09/25/2007   Essential hypertension, benign 09/25/2007   Past Medical History:  Diagnosis Date   Anemia    Blood transfusion without reported diagnosis    Cellulitis of left leg 03/01/2018   Chronic kidney disease    kidney transplant 07   Diabetes mellitus    Pt reports diagnosis in June 2011, Type 2   Diabetes mellitus without complication (HCC)    Esophageal obstruction due to food impaction    GERD (gastroesophageal reflux disease)    Hyperlipidemia    Hypertension    Intra-abdominal abscess (HCC) 10/28/2018   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   LEARNING DISABILITY 09/25/2007   Qualifier: Diagnosis of  By: Dayton Martes MD, Talia     Nausea and vomiting    Prolonged Q-T interval on ECG    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   Pyelonephritis 06/23/2014   Renal and perinephric abscess 11/01/2018   Renal disorder    Seasonal allergies     Seizures (HCC)    UTI (urinary tract infection) 01/09/2015   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   Qualifier: Diagnosis of  By: Sandi Mealy  MD, Judeth Cornfield      Family History  Problem Relation Age of Onset   Arthritis Mother    Hypertension Mother    Aneurysm Mother        died of brain aneurysm   CAD Father        Has 3 stents   Diabetes Father  borderline   Early death Brother        Died in war   Colon cancer Neg Hx    Esophageal cancer Neg Hx    Rectal cancer Neg Hx    Stomach cancer Neg Hx     Past Surgical History:  Procedure Laterality Date   AMPUTATION Left 04/02/2022   Procedure: AMPUTATION LEFT GREAT TOE;  Surgeon: Nadara Mustard, MD;  Location: [facility] OR;  Service: Orthopedics;  Laterality: Left;   ARTERIOVENOUS GRAFT PLACEMENT Bilateral    "neither work" (10/24/2017)   AV FISTULA PLACEMENT Left 10/26/2018   Procedure: CREATION OF ARTERIOVENOUS FISTULA  LEFT ARM;  Surgeon: Cephus Shelling, MD;  Location: [facility] OR;  Service: Vascular;  Laterality: Left;   AV FISTULA PLACEMENT Left 05/23/2020   Procedure: LEFT ARM ARTERIOVENOUS (AV) FISTULA CREATION;  Surgeon: Cephus Shelling, MD;  Location: [facility] OR;  Service: Vascular;  Laterality: Left;   BALLOON DILATION N/A 01/19/2021   Procedure: Marvis Repress DILATION;  Surgeon: Iva Boop, MD;  Location: [facility] ENDOSCOPY;  Service: Endoscopy;  Laterality: N/A;   BALLOON DILATION N/A 02/10/2021   Procedure: BALLOON DILATION;  Surgeon: Hilarie Fredrickson, MD;  Location: [facility] ENDOSCOPY;  Service: Endoscopy;  Laterality: N/A;   BALLOON DILATION N/A 02/19/2021   Procedure: BALLOON DILATION;  Surgeon: Imogene Burn, MD;  Location: [facility] ENDOSCOPY;  Service: Gastroenterology;  Laterality: N/A;   BASCILIC VEIN TRANSPOSITION Left 12/21/2018   Procedure: Left arm BASILIC VEIN TRANSPOSITION SECOND STAGE;  Surgeon: Cephus Shelling, MD;  Location: [facility] OR;  Service: Vascular;  Laterality: Left;   BIOPSY  12/29/2021   Procedure: BIOPSY;  Surgeon: Meryl Dare, MD;   Location: [facility] ENDOSCOPY;  Service: Gastroenterology;;   CHOLECYSTECTOMY N/A 06/30/2017   Procedure: LAPAROSCOPIC CHOLECYSTECTOMY WITH INTRAOPERATIVE CHOLANGIOGRAM;  Surgeon: Glenna Fellows, MD;  Location: [facility] ORS;  Service: General;  Laterality: N/A;   ESOPHAGOGASTRODUODENOSCOPY (EGD) WITH PROPOFOL N/A 07/04/2017   Procedure: ESOPHAGOGASTRODUODENOSCOPY (EGD) WITH PROPOFOL;  Surgeon: Vida Rigger, MD;  Location: [facility] ENDOSCOPY;  Service: Endoscopy;  Laterality: N/A;   ESOPHAGOGASTRODUODENOSCOPY (EGD) WITH PROPOFOL N/A 08/10/2020   Procedure: ESOPHAGOGASTRODUODENOSCOPY (EGD) WITH PROPOFOL;  Surgeon: Sherrilyn Rist, MD;  Location[facility] ENDOSCOPY;  Service: Gastroenterology;  Laterality: N/A;   ESOPHAGOGASTRODUODENOSCOPY (EGD) WITH PROPOFOL N/A 01/19/2021   Procedure: ESOPHAGOGASTRODUODENOSCOPY (EGD) WITH PROPOFOL;  Surgeon: Iva Boop, MD;  Location: [facility] ENDOSCOPY;  Service: Endoscopy;  Laterality: N/A;  WITH FLUOROSCOPY AND DILATION   ESOPHAGOGASTRODUODENOSCOPY (EGD) WITH PROPOFOL N/A 02/03/2021   Procedure: ESOPHAGOGASTRODUODENOSCOPY (EGD) WITH PROPOFOL;  Surgeon: Iva Boop, MD;  Location: [facility] ENDOSCOPY;  Service: Endoscopy;  Laterality: N/A;   ESOPHAGOGASTRODUODENOSCOPY (EGD) WITH PROPOFOL N/A 02/10/2021   Procedure: ESOPHAGOGASTRODUODENOSCOPY (EGD) WITH PROPOFOL;  Surgeon: Hilarie Fredrickson, MD;  Location: [facility] ENDOSCOPY;  Service: Endoscopy;  Laterality: N/A;  Balloon Dilation   ESOPHAGOGASTRODUODENOSCOPY (EGD) WITH PROPOFOL N/A 02/19/2021   Procedure: ESOPHAGOGASTRODUODENOSCOPY (EGD) WITH PROPOFOL;  Surgeon: Imogene Burn, MD;  Location: [facility] ENDOSCOPY;  Service: Gastroenterology;  Laterality: N/A;   ESOPHAGOGASTRODUODENOSCOPY (EGD) WITH PROPOFOL N/A 03/12/2021   Procedure: ESOPHAGOGASTRODUODENOSCOPY (EGD) WITH PROPOFOL;  Surgeon: Shellia Cleverly, DO;  Location: [facility] ENDOSCOPY;  Service: Gastroenterology;  Laterality: N/A;   ESOPHAGOGASTRODUODENOSCOPY (EGD) WITH PROPOFOL N/A 12/29/2021    Procedure: ESOPHAGOGASTRODUODENOSCOPY (EGD) WITH PROPOFOL;  Surgeon: Meryl Dare, MD;  Location: [facility] ENDOSCOPY;  Service: Gastroenterology;  Laterality: N/A;   FLEXIBLE SIGMOIDOSCOPY N/A 12/29/2021   Procedure: FLEXIBLE SIGMOIDOSCOPY;  Surgeon: Meryl Dare, MD;  Location:  [facility] ENDOSCOPY;  Service: Gastroenterology;  Laterality: N/A;   FOREIGN BODY REMOVAL N/A 02/03/2021   Procedure: FOREIGN BODY REMOVAL;  Surgeon: Iva Boop, MD;  Location: [facility] ENDOSCOPY;  Service: Endoscopy;  Laterality: N/A;   INSERTION OF DIALYSIS CATHETER N/A 03/20/2018   Procedure: INSERTION OF TUNNELED DIALYSIS CATHETER - RIGHT INTERANL JUGULAR PLACEMENT;  Surgeon: Chuck Hint, MD;  Location: [facility] OR;  Service: Vascular;  Laterality: N/A;   IR FLUORO GUIDE CV LINE RIGHT  04/18/2020   IR GUIDED DRAIN W CATHETER PLACEMENT  10/28/2018   KIDNEY TRANSPLANT  2007   KIDNEY TRANSPLANT Right    PARATHYROIDECTOMY  ?2012   "3/4 removed" (10/24/2017)   RENAL BIOPSY Bilateral 2003   REVISON OF ARTERIOVENOUS FISTULA Left 09/24/2020   Procedure: LEFT UPPER ARM ARTERIOVENOUS GRAFT CREATION;  Surgeon: Cephus Shelling, MD;  Location: [facility] OR;  Service: Vascular;  Laterality: Left;   UPPER EXTREMITY VENOGRAPHY Bilateral 10/19/2018   Procedure: UPPER EXTREMITY VENOGRAPHY;  Surgeon: Cephus Shelling, MD;  Location: [facility] INVASIVE CV LAB;  Service: Cardiovascular;  Laterality: Bilateral;  Bilateral    UPPER EXTREMITY VENOGRAPHY Left 09/04/2020   Procedure: UPPER EXTREMITY VENOGRAPHY - Left Upper;  Surgeon: Cephus Shelling, MD;  Location: [facility] INVASIVE CV LAB;  Service: Cardiovascular;  Laterality: Left;   Social History   Occupational History   Occupation: Conservation officer, 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

## 2022-11-05 ENCOUNTER — Encounter (HOSPITAL_COMMUNITY): Payer: Self-pay | Admitting: *Deleted

## 2022-11-09 NOTE — Telephone Encounter (Signed)
A user error has taken place: encounter opened in error, closed for administrative reasons.

## 2022-11-17 ENCOUNTER — Encounter (HOSPITAL_COMMUNITY): Payer: Self-pay | Admitting: *Deleted

## 2022-12-13 ENCOUNTER — Encounter (HOSPITAL_BASED_OUTPATIENT_CLINIC_OR_DEPARTMENT_OTHER): Payer: Self-pay

## 2022-12-13 ENCOUNTER — Emergency Department (HOSPITAL_BASED_OUTPATIENT_CLINIC_OR_DEPARTMENT_OTHER)
Admission: EM | Admit: 2022-12-13 | Discharge: 2022-12-13 | Disposition: A | Discharge status: Home / Self Care | Payer: 59 | Attending: Emergency Medicine | Admitting: Emergency Medicine

## 2022-12-13 ENCOUNTER — Other Ambulatory Visit: Payer: Self-pay

## 2022-12-13 ENCOUNTER — Encounter (HOSPITAL_COMMUNITY): Payer: Self-pay | Admitting: *Deleted

## 2022-12-13 ENCOUNTER — Emergency Department (HOSPITAL_BASED_OUTPATIENT_CLINIC_OR_DEPARTMENT_OTHER): Payer: 59

## 2022-12-13 ENCOUNTER — Other Ambulatory Visit (HOSPITAL_BASED_OUTPATIENT_CLINIC_OR_DEPARTMENT_OTHER): Payer: Self-pay

## 2022-12-13 DIAGNOSIS — N186 End stage renal disease: Secondary | ICD-10-CM | POA: Insufficient documentation

## 2022-12-13 DIAGNOSIS — R112 Nausea with vomiting, unspecified: Secondary | ICD-10-CM | POA: Diagnosis not present

## 2022-12-13 DIAGNOSIS — E1122 Type 2 diabetes mellitus with diabetic chronic kidney disease: Secondary | ICD-10-CM | POA: Diagnosis not present

## 2022-12-13 DIAGNOSIS — R1010 Upper abdominal pain, unspecified: Secondary | ICD-10-CM | POA: Diagnosis not present

## 2022-12-13 DIAGNOSIS — Z91010 Allergy to peanuts: Secondary | ICD-10-CM | POA: Diagnosis not present

## 2022-12-13 DIAGNOSIS — Z794 Long term (current) use of insulin: Secondary | ICD-10-CM | POA: Insufficient documentation

## 2022-12-13 DIAGNOSIS — Z992 Dependence on renal dialysis: Secondary | ICD-10-CM | POA: Insufficient documentation

## 2022-12-13 DIAGNOSIS — I120 Hypertensive chronic kidney disease with stage 5 chronic kidney disease or end stage renal disease: Secondary | ICD-10-CM | POA: Insufficient documentation

## 2022-12-13 DIAGNOSIS — R1084 Generalized abdominal pain: Secondary | ICD-10-CM | POA: Diagnosis present

## 2022-12-13 LAB — COMPREHENSIVE METABOLIC PANEL
ALT: 9 U/L (ref 0–44)
AST: 14 U/L — ABNORMAL LOW (ref 15–41)
Albumin: 4.4 g/dL (ref 3.5–5.0)
Alkaline Phosphatase: 52 U/L (ref 38–126)
Anion gap: 15 (ref 5–15)
BUN: 41 mg/dL — ABNORMAL HIGH (ref 6–20)
CO2: 26 mmol/L (ref 22–32)
Calcium: 9.4 mg/dL (ref 8.9–10.3)
Chloride: 96 mmol/L — ABNORMAL LOW (ref 98–111)
Creatinine, Ser: 10.12 mg/dL — ABNORMAL HIGH (ref 0.44–1.00)
GFR, Estimated: 5 mL/min — ABNORMAL LOW (ref >60)
Glucose, Bld: 262 mg/dL — ABNORMAL HIGH (ref 70–99)
Potassium: 4.7 mmol/L (ref 3.5–5.1)
Sodium: 137 mmol/L (ref 135–145)
Total Bilirubin: 0.6 mg/dL (ref 0.3–1.2)
Total Protein: 8.4 g/dL — ABNORMAL HIGH (ref 6.5–8.1)

## 2022-12-13 LAB — CBC WITH DIFFERENTIAL/PLATELET
Abs Immature Granulocytes: 0.01 10*3/uL (ref 0.00–0.07)
Basophils Absolute: 0 10*3/uL (ref 0.0–0.1)
Basophils Relative: 1 %
Eosinophils Absolute: 0.2 10*3/uL (ref 0.0–0.5)
Eosinophils Relative: 2 %
HCT: 35.2 % — ABNORMAL LOW (ref 36.0–46.0)
Hemoglobin: 11.5 g/dL — ABNORMAL LOW (ref 12.0–15.0)
Immature Granulocytes: 0 %
Lymphocytes Relative: 22 %
Lymphs Abs: 1.6 10*3/uL (ref 0.7–4.0)
MCH: 30.6 pg (ref 26.0–34.0)
MCHC: 32.7 g/dL (ref 30.0–36.0)
MCV: 93.6 fL (ref 80.0–100.0)
Monocytes Absolute: 0.4 10*3/uL (ref 0.1–1.0)
Monocytes Relative: 6 %
Neutro Abs: 4.9 10*3/uL (ref 1.7–7.7)
Neutrophils Relative %: 69 %
Platelets: 167 10*3/uL (ref 150–400)
RBC: 3.76 MIL/uL — ABNORMAL LOW (ref 3.87–5.11)
RDW: 15.5 % (ref 11.5–15.5)
WBC: 7.1 10*3/uL (ref 4.0–10.5)
nRBC: 0 % (ref 0.0–0.2)

## 2022-12-13 LAB — HCG, SERUM, QUALITATIVE: Preg, Serum: NEGATIVE

## 2022-12-13 LAB — LIPASE, BLOOD: Lipase: 41 U/L (ref 11–51)

## 2022-12-13 MED ORDER — SODIUM CHLORIDE 0.9 % IV BOLUS
1000.0000 mL | Freq: Once | INTRAVENOUS | Status: AC
  Administered 2022-12-13: 500 mL via INTRAVENOUS

## 2022-12-13 MED ORDER — METOPROLOL TARTRATE 25 MG PO TABS
25.0000 mg | ORAL_TABLET | Freq: Once | ORAL | Status: AC
  Administered 2022-12-13: 25 mg via ORAL
  Filled 2022-12-13: qty 1

## 2022-12-13 MED ORDER — HYDROMORPHONE HCL 1 MG/ML IJ SOLN
1.0000 mg | Freq: Once | INTRAMUSCULAR | Status: AC
  Administered 2022-12-13: 1 mg via INTRAVENOUS
  Filled 2022-12-13: qty 1

## 2022-12-13 MED ORDER — METOCLOPRAMIDE HCL 5 MG/ML IJ SOLN
10.0000 mg | Freq: Once | INTRAMUSCULAR | Status: DC
  Filled 2022-12-13 (×2): qty 2

## 2022-12-13 MED ORDER — PANTOPRAZOLE SODIUM 40 MG IV SOLR
40.0000 mg | Freq: Once | INTRAVENOUS | Status: AC
  Administered 2022-12-13: 40 mg via INTRAVENOUS
  Filled 2022-12-13: qty 10

## 2022-12-13 MED ORDER — LIDOCAINE VISCOUS HCL 2 % MT SOLN
15.0000 mL | Freq: Once | OROMUCOSAL | Status: AC
  Administered 2022-12-13: 15 mL via ORAL
  Filled 2022-12-13: qty 15

## 2022-12-13 MED ORDER — DICYCLOMINE HCL 20 MG PO TABS: 20.0000 mg | ORAL_TABLET | Freq: Two times a day (BID) | ORAL | 0 refills | Status: DC

## 2022-12-13 MED ORDER — MORPHINE SULFATE (PF) 4 MG/ML IV SOLN
4.0000 mg | Freq: Once | INTRAVENOUS | Status: AC
  Administered 2022-12-13: 4 mg via INTRAVENOUS
  Filled 2022-12-13: qty 1

## 2022-12-13 MED ORDER — ALUM & MAG HYDROXIDE-SIMETH 200-200-20 MG/5ML PO SUSP
30.0000 mL | Freq: Once | ORAL | Status: AC
  Administered 2022-12-13: 30 mL via ORAL
  Filled 2022-12-13: qty 30

## 2022-12-13 MED ORDER — ONDANSETRON 4 MG PO TBDP: 4.0000 mg | ORAL_TABLET | Freq: Three times a day (TID) | ORAL | 0 refills | Status: DC | PRN

## 2022-12-13 NOTE — ED Notes (Signed)
Brought pt back to room.  She had vomited approximately 300cc in emesis bag while in waiting area, clear liquid emesis.  Pt tells me that she feels poorly and that she has abdominal pain and thinks she is pregnant.

## 2022-12-13 NOTE — ED Notes (Signed)
Pt unable to provide urine sample for preg

## 2022-12-13 NOTE — ED Triage Notes (Signed)
Pt c/o abd pain, "sore titties- like so bad," vomiting onset last night approx 1a. Concern for pregnancy, states "the condom broke last time." Unsure of LMP, last intercourse "the end of August."  Dialysis T, Th, Sat

## 2022-12-13 NOTE — Discharge Instructions (Addendum)
You have been seen today for your complaint of abdominal pain, nausea, vomiting. Your lab work showed a slight elevation of your creatinine over your baseline but was otherwise reassuring. Your imaging was reassuring and showed no acute abnormalities. Your discharge medications include Bentyl.  This is a medicine used to help with abdominal pain.  Take it as prescribed and as needed. Zofran.  This is a nausea medicine.  Take it as needed for nausea and vomiting. Follow up with: Your primary care provider and GI provider as soon as possible for reevaluation Please seek immediate medical care if you develop any of the following symptoms: Have severe pain in your abdomen that does not improve with treatment. Have nausea that is severe or does not go away. Vomit every time you drink fluids. At this time there does not appear to be the presence of an emergent medical condition, however there is always the potential for conditions to change. Please read and follow the below instructions.  Do not take your medicine if  develop an itchy rash, swelling in your mouth or lips, or difficulty breathing; call 911 and seek immediate emergency medical attention if this occurs.  You may review your lab tests and imaging results in their entirety on your MyChart account.  Please discuss all results of fully with your primary care provider and other specialist at your follow-up visit.  Note: Portions of this text may have been transcribed using voice recognition software. Every effort was made to ensure accuracy; however, inadvertent computerized transcription errors may still be present.

## 2022-12-13 NOTE — ED Notes (Signed)
Pt Spo2 73-90% following pain meds. Pt placed on 2LNC, Spo2 100%. Alexander PA made aware

## 2022-12-13 NOTE — ED Notes (Signed)
Pt calling family for ride home, waiting for them to call back

## 2022-12-13 NOTE — ED Notes (Signed)
RN reviewed discharge instructions with pt. Pt verbalized understanding and had no further questions. VSS upon discharge. Brother to transport pt home

## 2022-12-13 NOTE — ED Provider Notes (Signed)
[place] EMERGENCY DEPARTMENT AT [facility] Provider Note   CSN: [id number] Arrival date & time: 12/13/22  1125     History  Chief Complaint  Patient presents with   Abdominal Pain    [name] is a 37 y.o. female.  With history of ESRD on dialysis Tuesday, Thursday, Saturday, hyperlipidemia, GERD, hypertension, diabetes, gastroparesis presenting to the ED for evaluation of generalized abdominal pain.  She reports mostly upper abdominal pain, nausea and vomiting that began at approximately 1 AM this morning.  She states she had a similar episode approximately 1 year ago and was told it was due to esophagus inflammation.  She is also concerned about pregnancy stating that the condom broke last time she had intercourse.  She is unsure her last menstrual period and her last intercourse was sometime at the end of August.  She has tried Tylenol and Carafate without improvement in her symptoms.  She denies any fevers, chest pain or shortness of breath.  She denies any diarrhea.  She does not produce urine.  She has not missed any sessions of dialysis.  Vomiting is described as very frequent, nonbloody and nonbilious.  She denies any vaginal complaints.  She reports taking metoprolol for her hypertension.  States she was not able to take it today due to the vomiting.   Abdominal Pain Associated symptoms: nausea and vomiting        Home Medications Prior to Admission medications   Medication Sig Start Date End Date Taking? Authorizing Provider  dicyclomine (BENTYL) 20 MG tablet Take 1 tablet (20 mg total) by mouth 2 (two) times daily for 5 days. 12/13/22 12/18/22 Yes Kyo Cocuzza, Edsel Petrin, PA-C  ondansetron (ZOFRAN-ODT) 4 MG disintegrating tablet Take 1 tablet (4 mg total) by mouth every 8 (eight) hours as needed for nausea or vomiting. 12/13/22  Yes Lavert Matousek, Edsel Petrin, PA-C  acetaminophen (TYLENOL) 500 MG tablet Take 500-1,000 mg by mouth every 6 (six) hours as needed for moderate  pain.    [provider]  Calcium Carbonate Antacid (CALCIUM CARBONATE, DOSED IN MG ELEMENTAL CALCIUM,) 1250 MG/5ML SUSP Take 5 mLs by mouth daily. 10/22/20   [provider]  cetirizine (ZYRTEC) 10 MG tablet Take 10 mg by mouth daily as needed for allergies.    [provider]  doxycycline (VIBRA-TABS) 100 MG tablet Take 1 tablet (100 mg total) by mouth 2 (two) times daily. 04/29/22   Nadara Mustard, MD  doxycycline (VIBRAMYCIN) 100 MG capsule Take 1 capsule (100 mg total) by mouth 2 (two) times daily. 04/09/22   Adonis Huguenin, NP  famotidine (PEPCID) 40 MG tablet Take 1 tablet (40 mg total) by mouth 2 (two) times daily as needed for heartburn or indigestion. Patient taking differently: Take 40 mg by mouth daily. 01/01/22   Lurene Shadow, MD  fluticasone (FLONASE) 50 MCG/ACT nasal spray Place 2 sprays into both nostrils daily as needed for allergies. 09/03/18   Glade Lloyd, MD  gabapentin (NEURONTIN) 600 MG tablet Take 600 mg by mouth 3 (three) times daily. 12/30/20   [provider]  HYDROcodone-acetaminophen (NORCO) 5-325 MG tablet Take 1 tablet by mouth every 6 (six) hours as needed. 04/09/22   Adonis Huguenin, NP  insulin aspart (NOVOLOG FLEXPEN) 100 UNIT/ML FlexPen Inject 0-15 Units into the skin in the morning, at noon, in the evening, and at bedtime. Sliding Scale insulin    [provider]  insulin degludec (TRESIBA) 100 UNIT/ML FlexTouch Pen Inject 5-10 Units into  the skin daily. Patient taking differently: Inject 7 Units into the skin daily. 01/01/22   Lurene Shadow, MD  lidocaine-prilocaine (EMLA) cream Apply 1 application topically daily as needed [facility]). 12/17/20   [provider]  loperamide (IMODIUM) 2 MG capsule Take 2 mg by mouth as needed for diarrhea or loose stools. 09/11/20   [provider]  meclizine (ANTIVERT) 25 MG tablet Take 1 tablet (25 mg total) by mouth 3 (three) times daily as needed for dizziness. 06/10/22    Mesner, Barbara Cower, MD  metoprolol tartrate (LOPRESSOR) 25 MG tablet Take 1 tablet (25 mg total) by mouth daily as needed (for systolic blood pressure or 135 or above). 01/01/22   Lurene Shadow, MD  omeprazole (PRILOSEC) 40 MG capsule Take 1 capsule (40 mg total) by mouth 2 (two) times daily before a meal. Open capsule and place granules in water or applesauce to swallow 03/17/21   Iva Boop, MD  oxyCODONE-acetaminophen (PERCOCET/ROXICET) 5-325 MG tablet Take 1 tablet by mouth daily as needed for severe pain. 01/01/22   Lurene Shadow, MD  RENVELA 2.4 g PACK Take 2.4 g by mouth 3 (three) times daily. 05/04/21   [provider]  simvastatin (ZOCOR) 20 MG tablet Take 20 mg by mouth at bedtime.    [provider]      Allergies    Diphenhydramine, Motrin [ibuprofen], Peanut-containing drug products, Contrast media [iodinated contrast media], Shellfish allergy, Banana, Chlorhexidine, Ferrous sulfate, and Iron dextran    Review of Systems   Review of Systems  Gastrointestinal:  Positive for abdominal pain, nausea and vomiting.  All other systems reviewed and are negative.   Physical Exam Updated Vital Signs BP (!) 181/111   Pulse 95   Temp (!) 97.4 F (36.3 C) (Oral)   Resp 17   Ht 5\' 6"  (1.676 m)   Wt 81.6 kg   SpO2 96%   BMI 29.05 kg/m  Physical Exam Vitals and nursing note reviewed.  Constitutional:      General: She is in acute distress.     Appearance: She is well-developed. She is not diaphoretic.  HENT:     Head: Normocephalic and atraumatic.  Eyes:     Conjunctiva/sclera: Conjunctivae normal.  Cardiovascular:     Rate and Rhythm: Normal rate and regular rhythm.     Heart sounds: No murmur heard. Pulmonary:     Effort: Pulmonary effort is normal. No respiratory distress.     Breath sounds: Normal breath sounds. No wheezing, rhonchi or rales.  Abdominal:     Palpations: Abdomen is soft.     Tenderness: There is generalized abdominal tenderness. There is  guarding.  Musculoskeletal:        General: No swelling.     Cervical back: Neck supple.  Skin:    General: Skin is warm and dry.     Capillary Refill: Capillary refill takes less than 2 seconds.  Neurological:     Mental Status: She is alert.  Psychiatric:        Mood and Affect: Mood normal.     ED Results / Procedures / Treatments   Labs (all labs ordered are listed, but only abnormal results are displayed) Labs Reviewed  COMPREHENSIVE METABOLIC PANEL - Abnormal; Notable for the following components:      Result Value   Chloride 96 (*)    Glucose, Bld 262 (*)    BUN 41 (*)    Creatinine, Ser 10.12 (*)    Total Protein 8.4 (*)  AST 14 (*)    GFR, Estimated 5 (*)    All other components within normal limits  CBC WITH DIFFERENTIAL/PLATELET - Abnormal; Notable for the following components:   RBC 3.76 (*)    Hemoglobin 11.5 (*)    HCT 35.2 (*)    All other components within normal limits  LIPASE, BLOOD  HCG, SERUM, QUALITATIVE    EKG None  Radiology CT ABDOMEN PELVIS WO CONTRAST  Result Date: 12/13/2022 CLINICAL DATA:  Abdominal pain. Nonlocalized. Dialysis patient. History of renal transplant with subsequent rejection. EXAM: CT ABDOMEN AND PELVIS WITHOUT CONTRAST TECHNIQUE: Multidetector CT imaging of the abdomen and pelvis was performed following the standard protocol without IV contrast. RADIATION DOSE REDUCTION: This exam was performed according to the departmental dose-optimization program which includes automated exposure control, adjustment of the mA and/or kV according to patient size and/or use of iterative reconstruction technique. COMPARISON:  CT abdomen pelvis 09/25/2022 FINDINGS: Lower chest: No acute abnormality. Evaluation abdominal viscera is lack contrast. Hepatobiliary: No new focal liver abnormality is seen. Status post cholecystectomy. Pancreas: Unremarkable. No surrounding inflammatory changes. Spleen: Normal in size without focal abnormality.  Adrenals/Urinary Tract: Absent right kidney. Atrophic left kidney. Appearance of glands. No left renal calculi or hydronephrosis. Unremarkable urinary bladder. Stable postsurgical right quadrant. Stomach/Bowel: Stomach is within normal limits. Appendix appears normal. No evidence of bowel wall thickening, distention, or inflammatory changes. Vascular/Lymphatic: No enlarged abdominal or pelvic lymph nodes. Reproductive: No adnexal masses. Other: No abdominopelvic ascites. Musculoskeletal: No acute or significant osseous findings. IMPRESSION: 1. No acute findings in the abdomen or pelvis on a noncontrast exam. 2. Stable postsurgical changes in the right lower quadrant following renal transplant rejection. Electronically Signed   By: Emmaline Kluver M.D.   On: 12/13/2022 16:14    Procedures Procedures    Medications Ordered in ED Medications  metoCLOPramide (REGLAN) injection 10 mg (10 mg Intravenous Not Given 12/13/22 1444)  sodium chloride 0.9 % bolus 1,000 mL (0 mLs Intravenous Stopped 12/13/22 1611)  HYDROmorphone (DILAUDID) injection 1 mg (1 mg Intravenous Given 12/13/22 1443)  pantoprazole (PROTONIX) injection 40 mg (40 mg Intravenous Given 12/13/22 1511)  morphine (PF) 4 MG/ML injection 4 mg (4 mg Intravenous Given 12/13/22 1540)  alum & mag hydroxide-simeth (MAALOX/MYLANTA) 200-200-20 MG/5ML suspension 30 mL (30 mLs Oral Given 12/13/22 1539)    And  lidocaine (XYLOCAINE) 2 % viscous mouth solution 15 mL (15 mLs Oral Given 12/13/22 1539)  metoprolol tartrate (LOPRESSOR) tablet 25 mg (25 mg Oral Given 12/13/22 1610)    ED Course/ Medical Decision Making/ A&P Clinical Course as of 12/13/22 1821  Mon Dec 13, 2022  1636 On reevaluation, patient feeling significantly improved.  Feels a bit lightheaded from the analgesics.  She was requiring some oxygen which is not abnormal for her, however is now saturating 99% on room air.  Would like to be discharged home.  Tolerating p.o. intake without  difficulty.  Blood pressure significantly improved as well after home antihypertensive. [AS]    Clinical Course User Index [AS] Syna Gad, Edsel Petrin, PA-C                                 Medical Decision Making Amount and/or Complexity of Data Reviewed Labs: ordered. Radiology: ordered.  Risk OTC drugs. Prescription drug management.   This patient presents to the ED for concern of abdominal pain, vomiting, this involves an extensive number of treatment options, and is  a complaint that carries with it a high risk of complications and morbidity.  The emergent differential diagnosis for vomiting includes, but is not limited to ACS/MI, DKA, Ischemic bowel, Meningitis, Sepsis, Acute gastric dilation, Adrenal insufficiency, Appendicitis,  Bowel obstruction/ileus, Carbon monoxide poisoning, Cholecystitis, Electrolyte abnormalities, Elevated ICP, Gastric outlet obstruction, Pancreatitis, Ruptured viscus, Biliary colic, Cannabinoid hyperemesis syndrome, Gastritis, Gastroenteritis, Gastroparesis,  Narcotic withdrawal, Peptic ulcer disease, and UTI   My initial workup includes labs, imaging, symptom control  Additional history obtained from: Nursing notes from this visit. Previous records within EMR system ED visit on 09/25/2022 for similar  I ordered, reviewed and interpreted labs which include: CBC, CMP, lipase, hCG.  No leukocytosis.  Stable anemia with hemoglobin of 11.5.  Hyperglycemia of 262.  Slight increase to creatinine from baseline of 9 to 10.12.  I ordered imaging studies including CT abdomen pelvis without contrast I independently visualized and interpreted imaging which showed no acute findings I agree with the radiologist interpretation  Afebrile, hypertensive but otherwise hemodynamically stable.  Significant improvement in hypertension after home medications.  Otherwise hemodynamically stable.  37 year old female presenting to the ED for evaluation of upper abdominal pain, nausea  and vomiting.  This began at 1:00 this morning.  On chart review, she has been diagnosed with gastroparesis by her GI provider.  She was given pain medication and Protonix here in the emergency department and reported full resolution of her symptoms.  After analgesia, she did have an episode of hypoxia and was placed on 2 L nasal cannula of supplemental oxygen.  This was subsequently discontinued and she was saturating well on room air.  Lab workup revealed an elevated creatinine over her baseline, was otherwise reassuring.  No leukocytosis or anemia.  Overall suspect gastroparesis.  She does say that she is exposed to secondhand marijuana use nearly every day but does not use marijuana herself.  Patient is scheduled for dialysis tomorrow.  She is also scheduled for esophageal dilation with her GI provider in the near future.  She is tolerating p.o. intake without difficulty.  Prescriptions for Zofran and Bentyl were sent for symptoms.  She was encouraged to follow-up with her PCP as soon as possible for reevaluation.  She will call for a ride home as she was reporting feeling woozy after analgesics.  She was given return precautions.  Stable at discharge.  At this time there does not appear to be any evidence of an acute emergency medical condition and the patient appears stable for discharge with appropriate outpatient follow up. Diagnosis was discussed with patient who verbalizes understanding of care plan and is agreeable to discharge. I have discussed return precautions with patient who verbalizes understanding. Patient encouraged to follow-up with their PCP within 1 week. All questions answered.  Note: Portions of this report may have been transcribed using voice recognition software. Every effort was made to ensure accuracy; however, inadvertent computerized transcription errors may still be present.        Final Clinical Impression(s) / ED Diagnoses Final diagnoses:  Nausea and vomiting,  unspecified vomiting type  Pain of upper abdomen    Rx / DC Orders ED Discharge Orders          Ordered    ondansetron (ZOFRAN-ODT) 4 MG disintegrating tablet  Every 8 hours PRN        12/13/22 1636    dicyclomine (BENTYL) 20 MG tablet  2 times daily        12/13/22 1636  Michelle Piper, PA-C 12/13/22 1821    Anders Simmonds T, DO 12/14/22 [phone]

## 2022-12-19 ENCOUNTER — Other Ambulatory Visit: Payer: Self-pay

## 2022-12-19 ENCOUNTER — Encounter (HOSPITAL_BASED_OUTPATIENT_CLINIC_OR_DEPARTMENT_OTHER): Payer: Self-pay

## 2022-12-19 ENCOUNTER — Emergency Department (HOSPITAL_BASED_OUTPATIENT_CLINIC_OR_DEPARTMENT_OTHER)
Admission: EM | Admit: 2022-12-19 | Discharge: 2022-12-19 | Disposition: A | Discharge status: Home / Self Care | Payer: 59 | Attending: Emergency Medicine | Admitting: Emergency Medicine

## 2022-12-19 DIAGNOSIS — E1122 Type 2 diabetes mellitus with diabetic chronic kidney disease: Secondary | ICD-10-CM | POA: Insufficient documentation

## 2022-12-19 DIAGNOSIS — I129 Hypertensive chronic kidney disease with stage 1 through stage 4 chronic kidney disease, or unspecified chronic kidney disease: Secondary | ICD-10-CM | POA: Insufficient documentation

## 2022-12-19 DIAGNOSIS — R112 Nausea with vomiting, unspecified: Secondary | ICD-10-CM | POA: Insufficient documentation

## 2022-12-19 DIAGNOSIS — N189 Chronic kidney disease, unspecified: Secondary | ICD-10-CM | POA: Insufficient documentation

## 2022-12-19 DIAGNOSIS — Z940 Kidney transplant status: Secondary | ICD-10-CM | POA: Insufficient documentation

## 2022-12-19 DIAGNOSIS — R1013 Epigastric pain: Secondary | ICD-10-CM | POA: Diagnosis present

## 2022-12-19 LAB — COMPREHENSIVE METABOLIC PANEL
ALT: 9 U/L (ref 0–44)
AST: 15 U/L (ref 15–41)
Albumin: 4.6 g/dL (ref 3.5–5.0)
Alkaline Phosphatase: 61 U/L (ref 38–126)
Anion gap: 11 (ref 5–15)
BUN: 17 mg/dL (ref 6–20)
CO2: 30 mmol/L (ref 22–32)
Calcium: 10 mg/dL (ref 8.9–10.3)
Chloride: 95 mmol/L — ABNORMAL LOW (ref 98–111)
Creatinine, Ser: 6.51 mg/dL — ABNORMAL HIGH (ref 0.44–1.00)
GFR, Estimated: 8 mL/min — ABNORMAL LOW (ref >60)
Glucose, Bld: 321 mg/dL — ABNORMAL HIGH (ref 70–99)
Potassium: 4 mmol/L (ref 3.5–5.1)
Sodium: 136 mmol/L (ref 135–145)
Total Bilirubin: 0.6 mg/dL (ref 0.3–1.2)
Total Protein: 8.4 g/dL — ABNORMAL HIGH (ref 6.5–8.1)

## 2022-12-19 LAB — CBC
HCT: 37.3 % (ref 36.0–46.0)
Hemoglobin: 11.8 g/dL — ABNORMAL LOW (ref 12.0–15.0)
MCH: 30.1 pg (ref 26.0–34.0)
MCHC: 31.6 g/dL (ref 30.0–36.0)
MCV: 95.2 fL (ref 80.0–100.0)
Platelets: 217 10*3/uL (ref 150–400)
RBC: 3.92 MIL/uL (ref 3.87–5.11)
RDW: 15.7 % — ABNORMAL HIGH (ref 11.5–15.5)
WBC: 6.3 10*3/uL (ref 4.0–10.5)
nRBC: 0 % (ref 0.0–0.2)

## 2022-12-19 LAB — CBG MONITORING, ED: Glucose-Capillary: 283 mg/dL — ABNORMAL HIGH (ref 70–99)

## 2022-12-19 LAB — TROPONIN I (HIGH SENSITIVITY): Troponin I (High Sensitivity): 8 ng/L (ref <18)

## 2022-12-19 LAB — LIPASE, BLOOD: Lipase: 38 U/L (ref 11–51)

## 2022-12-19 MED ORDER — SODIUM CHLORIDE 0.9 % IV BOLUS
1000.0000 mL | Freq: Once | INTRAVENOUS | Status: AC
  Administered 2022-12-19: 1000 mL via INTRAVENOUS

## 2022-12-19 MED ORDER — PROCHLORPERAZINE EDISYLATE 10 MG/2ML IJ SOLN
10.0000 mg | Freq: Once | INTRAMUSCULAR | Status: AC
  Administered 2022-12-19: 10 mg via INTRAVENOUS
  Filled 2022-12-19: qty 2

## 2022-12-19 MED ORDER — PROMETHAZINE HCL 25 MG PO TABS: 25.0000 mg | ORAL_TABLET | Freq: Four times a day (QID) | ORAL | 0 refills | Status: DC | PRN

## 2022-12-19 MED ORDER — HYDROMORPHONE HCL 1 MG/ML IJ SOLN
1.0000 mg | Freq: Once | INTRAMUSCULAR | Status: AC
  Administered 2022-12-19: 1 mg via INTRAVENOUS
  Filled 2022-12-19: qty 1

## 2022-12-19 MED ORDER — HALOPERIDOL LACTATE 5 MG/ML IJ SOLN
2.0000 mg | Freq: Once | INTRAMUSCULAR | Status: AC
  Administered 2022-12-19: 2 mg via INTRAVENOUS
  Filled 2022-12-19: qty 1

## 2022-12-19 NOTE — ED Provider Notes (Signed)
[facility] EMERGENCY [facility] Emergency Department Provider Note MRN:  [id number]  Arrival date & time: 12/19/22     Chief Complaint   Abdominal Pain   History of Present Illness   [name] is a 37 y.o. year-old female with a history of diabetes, gastroparesis, ESRD presenting to the ED with chief complaint of abdominal pain.  Epigastric abdominal pain feels similar to prior episodes of gastroparesis.  Pain is severe, associated with nausea vomiting.  Present for a few days, worse tonight.  Review of Systems  A thorough review of systems was obtained and all systems are negative except as noted in the HPI and PMH.   Patient's Health History    Past Medical History:  Diagnosis Date   Anemia    Blood transfusion without reported diagnosis    Cellulitis of left leg 03/01/2018   Chronic kidney disease    kidney transplant 07   Diabetes mellitus    Pt reports diagnosis in June 2011, Type 2   Diabetes mellitus without complication (HCC)    Esophageal obstruction due to food impaction    GERD (gastroesophageal reflux disease)    Hyperlipidemia    Hypertension    Intra-abdominal abscess (HCC) 10/28/2018   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   LEARNING DISABILITY 09/25/2007   Qualifier: Diagnosis of  By: Dayton Martes MD, Talia     Nausea and vomiting    Prolonged Q-T interval on ECG    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   Pyelonephritis 06/23/2014   Renal and perinephric abscess 11/01/2018   Renal disorder    Seasonal allergies    Seizures (HCC)    UTI (urinary tract infection) 01/09/2015   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   Qualifier: Diagnosis of  By: Sandi Mealy  MD, Judeth Cornfield      Past Surgical History:  Procedure Laterality Date   AMPUTATION Left 04/02/2022   Procedure: AMPUTATION LEFT GREAT TOE;  Surgeon: Nadara Mustard, MD;  Location: [facility] OR;  Service: Orthopedics;  Laterality: Left;   ARTERIOVENOUS GRAFT PLACEMENT Bilateral    "neither work" (10/24/2017)   AV FISTULA  PLACEMENT Left 10/26/2018   Procedure: CREATION OF ARTERIOVENOUS FISTULA  LEFT ARM;  Surgeon: Cephus Shelling, MD;  Location: [facility] OR;  Service: Vascular;  Laterality: Left;   AV FISTULA PLACEMENT Left 05/23/2020   Procedure: LEFT ARM ARTERIOVENOUS (AV) FISTULA CREATION;  Surgeon: Cephus Shelling, MD;  Location: [facility] OR;  Service: Vascular;  Laterality: Left;   BALLOON DILATION N/A 01/19/2021   Procedure: Marvis Repress DILATION;  Surgeon: Iva Boop, MD;  Location: [facility] ENDOSCOPY;  Service: Endoscopy;  Laterality: N/A;   BALLOON DILATION N/A 02/10/2021   Procedure: BALLOON DILATION;  Surgeon: Hilarie Fredrickson, MD;  Location: [facility]OSCOPY;  Service: Endoscopy;  Laterality: N/A;   BALLOON DILATION N/A 02/19/2021   Procedure: BALLOON DILATION;  Surgeon: Imogene Burn, MD;  Location: [facility]COPY;  Service: Gastroenterology;  Laterality: N/A;   BASCILIC VEIN TRANSPOSITION Left 12/21/2018   Procedure: Left arm BASILIC VEIN TRANSPOSITION SECOND STAGE;  Surgeon: Cephus Shelling, MD;  Location: [facility] OR;  Service: Vascular;  Laterality: Left;   BIOPSY  12/29/2021   Procedure: BIOPSY;  Surgeon: Meryl Dare, MD;  Location: [facility] ENDOSCOPY;  Service: Gastroenterology;;   CHOLECYSTECTOMY N/A 06/30/2017   Procedure: LAPAROSCOPIC CHOLECYSTECTOMY WITH INTRAOPERATIVE CHOLANGIOGRAM;  Surgeon: Glenna Fellows, MD;  Location: [facility] ORS;  Service: General;  Laterality: N/A;   ESOPHAGOGASTRODUODENOSCOPY (EGD) WITH PROPOFOL N/A 07/04/2017   Procedure: ESOPHAGOGASTRODUODENOSCOPY (EGD) WITH  PROPOFOL;  Surgeon: Vida Rigger, MD;  Location: [facility]COPY;  Service: Endoscopy;  Laterality: N/A;   ESOPHAGOGASTRODUODENOSCOPY (EGD) WITH PROPOFOL N/A 08/10/2020   Procedure: ESOPHAGOGASTRODUODENOSCOPY (EGD) WITH PROPOFOL;  Surgeon: Sherrilyn Rist, MD;  Location: [facility] ENDOSCOPY;  Service: Gastroenterology;  Laterality: N/A;   ESOPHAGOGASTRODUODENOSCOPY (EGD) WITH PROPOFOL N/A 01/19/2021   Procedure: ESOPHAGOGASTRODUODENOSCOPY (EGD)  WITH PROPOFOL;  Surgeon: Iva Boop, MD;  Location: [facility];  Service: Endoscopy;  Laterality: N/A;  WITH FLUOROSCOPY AND DILATION   ESOPHAGOGASTRODUODENOSCOPY (EGD) WITH PROPOFOL N/A 02/03/2021   Procedure: ESOPHAGOGASTRODUODENOSCOPY (EGD) WITH PROPOFOL;  Surgeon: Iva Boop, MD;  Location: [facility];  Service: Endoscopy;  Laterality: N/A;   ESOPHAGOGASTRODUODENOSCOPY (EGD) WITH PROPOFOL N/A 02/10/2021   Procedure: ESOPHAGOGASTRODUODENOSCOPY (EGD) WITH PROPOFOL;  Surgeon: Hilarie Fredrickson, MD;  Location: [facility];  Service: Endoscopy;  Laterality: N/A;  Balloon Dilation   ESOPHAGOGASTRODUODENOSCOPY (EGD) WITH PROPOFOL N/A 02/19/2021   Procedure: ESOPHAGOGASTRODUODENOSCOPY (EGD) WITH PROPOFOL;  Surgeon: Imogene Burn, MD;  Location: [facility];  Service: Gastroenterology;  Laterality: N/A;   ESOPHAGOGASTRODUODENOSCOPY (EGD) WITH PROPOFOL N/A 03/12/2021   Procedure: ESOPHAGOGASTRODUODENOSCOPY (EGD) WITH PROPOFOL;  Surgeon: Shellia Cleverly, DO;  Location: [facility];  Service: Gastroenterology;  Laterality: N/A;   ESOPHAGOGASTRODUODENOSCOPY (EGD) WITH PROPOFOL N/A 12/29/2021   Procedure: ESOPHAGOGASTRODUODENOSCOPY (EGD) WITH PROPOFOL;  Surgeon: Meryl Dare, MD;  Location: [facility] ENDOSCOPY;  Service: Gastroenterology;  Laterality: N/A;   FLEXIBLE SIGMOIDOSCOPY N/A 12/29/2021   Procedure: FLEXIBLE SIGMOIDOSCOPY;  Surgeon: Meryl Dare, MD;  Location: [facility] ENDOSCOPY;  Service: Gastroenterology;  Laterality: N/A;   FOREIGN BODY REMOVAL N/A 02/03/2021   Procedure: FOREIGN BODY REMOVAL;  Surgeon: Iva Boop, MD;  Location: [facility]OS[facility];  Service: Endoscopy;  Laterality: N/A;   INSERTION OF DIALYSIS CATHETER N/A 03/20/2018   Procedure: INSERTION OF TUNNELED DIALYSIS CATHETER - RIGHT INTERANL JUGULAR PLACEMENT;  Surgeon: Chuck Hint, MD;  Location: [facility] OR;  Service: Vascular;  Laterality: N/A;   IR FLUORO GUIDE CV LINE RIGHT  04/18/2020   IR GUIDED DRAIN W CATHETER  PLACEMENT  10/28/2018   KIDNEY TRANSPLANT  2007   KIDNEY TRANSPLANT Right    PARATHYROIDECTOMY  ?2012   "3/4 removed" (10/24/2017)   RENAL BIOPSY Bilateral 2003   REVISON OF ARTERIOVENOUS FISTULA Left 09/24/2020   Procedure: LEFT UPPER ARM ARTERIOVENOUS GRAFT CREATION;  Surgeon: Cephus Shelling, MD;  Location: [facility] OR;  Service: Vascular;  Laterality: Left;   UPPER EXTREMITY VENOGRAPHY Bilateral 10/19/2018   Procedure: UPPER EXTREMITY VENOGRAPHY;  Surgeon: Cephus Shelling, MD;  Location: [facility] INVASIVE CV LAB;  Service: Cardiovascular;  Laterality: Bilateral;  Bilateral    UPPER EXTREMITY VENOGRAPHY Left 09/04/2020   Procedure: UPPER EXTREMITY VENOGRAPHY - Left Upper;  Surgeon: Cephus Shelling, MD;  Location: [facility] INVASIVE CV LAB;  Service: Cardiovascular;  Laterality: Left;    Family History  Problem Relation Age of Onset   Arthritis Mother    Hypertension Mother    Aneurysm Mother        died of brain aneurysm   CAD Father        Has 3 stents   Diabetes Father        borderline   Early death Brother        Died in war   Colon cancer Neg Hx    Esophageal cancer Neg Hx    Rectal cancer Neg Hx    Stomach cancer Neg Hx     Social History   Socioeconomic History  Marital status: Single    Spouse name: Not on file   Number of children: Not on file   Years of education: Not on file   Highest education level: Not on file  Occupational History   Occupation: Conservation officer, 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  Other Topics Concern   Not on file  Social History Narrative   Single\   Dialysis Monday Wednesday Friday   Never smoker no current alcohol no drug use      Right Handed   Drinks  caffeine rarely   Social Determinants of Health   Financial Resource Strain: Low Risk  (10/24/2017)   Overall Financial Resource Strain (CARDIA)    Difficulty of Paying Living Expenses: Not very hard  Food Insecurity: No Food Insecurity (03/30/2022)   Hunger Vital Sign    Worried About Running Out of Food in the Last Year: Never true    Ran Out of Food in the Last Year: Never true  Transportation Needs: No Transportation Needs (03/30/2022)   PRAPARE - Administrator, Civil Service (Medical): No    Lack of Transportation (Non-Medical): No  Physical Activity: Sufficiently Active (10/24/2017)   Exercise Vital Sign    Days of Exercise per Week: 7 days    Minutes of Exercise per Session: 30 min  Stress: No Stress Concern Present (10/24/2017)   [employer] of Occupational Health - Occupational Stress Questionnaire    Feeling of Stress : Not at all  Social Connections: Somewhat Isolated (10/24/2017)   Social Connection and Isolation Panel [NHANES]    Frequency of Communication with Friends and Family: Not on file    Frequency of Social Gatherings with Friends and Family: More than three times a week    Attends Religious Services: More than 4 times per year    Active Member of [employer] or Organizations: No    Attends Banker Meetings: Never    Marital Status: Never married  Intimate Partner Violence: Not At Risk (03/30/2022)   Humiliation, Afraid, Rape, and Kick questionnaire    Fear of Current or Ex-Partner: No    Emotionally Abused: No    Physically Abused: No    Sexually Abused: No     Physical Exam   Vitals:   12/19/22 0600 12/19/22 0700  BP: 104/82 (!) 141/96  Pulse: 85 81  Resp: 12 13  Temp:    SpO2: 99% 99%    CONSTITUTIONAL: Chronically ill-appearing, NAD NEURO/PSYCH:  Alert and oriented x 3, no focal deficits EYES:  eyes equal and reactive ENT/NECK:  no LAD, no JVD CARDIO: Tachycardic rate, well-perfused, normal S1 and S2 PULM:  CTAB no  wheezing or rhonchi GI/GU:  non-distended, non-tender MSK/SPINE:  No gross deformities, no edema SKIN:  no rash, atraumatic   *Additional and/or pertinent findings included in MDM below  Diagnostic and Interventional Summary    EKG Interpretation Date/Time:  Sunday December 19 2022 04:37:52 EDT Ventricular Rate:  83 PR Interval:  156 QRS Duration:  84 QT Interval:  437 QTC Calculation: 514 R Axis:   79  Text Interpretation: Sinus rhythm Prolonged QT interval Confirmed by Pilar Plate,  Casimiro Needle [phone]) on 12/19/2022 6:15:22 AM       Labs Reviewed  CBC - Abnormal; Notable for the following components:      Result Value   Hemoglobin 11.8 (*)    RDW 15.7 (*)    All other components within normal limits  COMPREHENSIVE METABOLIC PANEL - Abnormal; Notable for the following components:   Chloride 95 (*)    Glucose, Bld 321 (*)    Creatinine, Ser 6.51 (*)    Total Protein 8.4 (*)    GFR, Estimated 8 (*)    All other components within normal limits  CBG MONITORING, ED - Abnormal; Notable for the following components:   Glucose-Capillary 283 (*)    All other components within normal limits  LIPASE, BLOOD  TROPONIN I (HIGH SENSITIVITY)  TROPONIN I (HIGH SENSITIVITY)    No orders to display    Medications  prochlorperazine (COMPAZINE) injection 10 mg (10 mg Intravenous Given 12/19/22 0420)  sodium chloride 0.9 % bolus 1,000 mL (0 mLs Intravenous Stopped 12/19/22 0525)  HYDROmorphone (DILAUDID) injection 1 mg (1 mg Intravenous Given 12/19/22 0422)  haloperidol lactate (HALDOL) injection 2 mg (2 mg Intravenous Given 12/19/22 0520)  HYDROmorphone (DILAUDID) injection 1 mg (1 mg Intravenous Given 12/19/22 0520)     Procedures  /  Critical Care Procedures  ED Course and Medical Decision Making  Initial Impression and Ddx Patient arrives tachycardic and hypertensive, complaining of a lot of pain similar to prior episodes of gastroparesis.  Also considering gastritis, atypical presentation of  ACS, cholecystitis.  Awaiting labs, pain control, will reassess.  Past medical/surgical history that increases complexity of ED encounter: ESRD, diabetes, gastroparesis  Interpretation of Diagnostics I personally reviewed the EKG and my interpretation is as follows: Sinus rhythm  Labs reassuring with no significant blood count or electrolyte disturbance.  Kidney function at or near recent baseline.  Patient Reassessment and Ultimate Disposition/Management     Patient feeling much better after medications listed above, vital signs of normalized.  Abdomen soft, no emergent process is evident appropriate for discharge.  Patient management required discussion with the following services or consulting groups:  None  Complexity of Problems Addressed Acute illness or injury that poses threat of life of bodily function  Additional Data Reviewed and Analyzed Further history obtained from: Further history from spouse/family member  Additional Factors Impacting ED Encounter Risk Prescriptions  Elmer Sow. Pilar Plate, MD [facility] Emergency Medicine [facility] [email or web address]  Final Clinical Impressions(s) / ED Diagnoses     ICD-10-CM   1. Epigastric pain  R10.13       ED Discharge Orders          Ordered    promethazine (PHENERGAN) 25 MG tablet  Every 6 hours PRN        12/19/22 0704             Discharge Instructions Discussed with and Provided to Patient:     Discharge Instructions      You were evaluated in the Emergency Department and after careful evaluation, we did not find any emergent condition requiring admission or further testing in the hospital.  Your exam/testing today was overall reassuring.  Use the Bentyl medication at home for discomfort.  Can use the Phenergan as needed for nausea.  Follow-up with your regular doctors.  Please return to the Emergency Department if you experience any worsening of your condition.  Thank you for allowing  [place] to be a part of your care.  Sabas Sous, MD 12/19/22 [phone]

## 2022-12-19 NOTE — ED Triage Notes (Signed)
Pt complains of generalized abdominal pain with nausea/vomiting since 2000 last night. Pt has ESRD and history of gasroparesis and esophagitis.

## 2022-12-19 NOTE — Discharge Instructions (Signed)
You were evaluated in the Emergency Department and after careful evaluation, we did not find any emergent condition requiring admission or further testing in the hospital.  Your exam/testing today was overall reassuring.  Use the Bentyl medication at home for discomfort.  Can use the Phenergan as needed for nausea.  Follow-up with your regular doctors.  Please return to the Emergency Department if you experience any worsening of your condition.  Thank you for allowing [place] to be a part of your care.

## 2022-12-20 ENCOUNTER — Emergency Department (HOSPITAL_COMMUNITY): Payer: 59

## 2022-12-20 ENCOUNTER — Encounter (HOSPITAL_COMMUNITY): Payer: Self-pay

## 2022-12-20 ENCOUNTER — Other Ambulatory Visit: Payer: Self-pay

## 2022-12-20 ENCOUNTER — Observation Stay (HOSPITAL_COMMUNITY)
Admission: EM | Admit: 2022-12-20 | Discharge: 2022-12-22 | Disposition: A | Discharge status: Home / Self Care | Payer: 59 | Attending: Internal Medicine | Admitting: Internal Medicine

## 2022-12-20 DIAGNOSIS — E1065 Type 1 diabetes mellitus with hyperglycemia: Secondary | ICD-10-CM | POA: Diagnosis not present

## 2022-12-20 DIAGNOSIS — Z91010 Allergy to peanuts: Secondary | ICD-10-CM | POA: Insufficient documentation

## 2022-12-20 DIAGNOSIS — R42 Dizziness and giddiness: Secondary | ICD-10-CM | POA: Insufficient documentation

## 2022-12-20 DIAGNOSIS — R112 Nausea with vomiting, unspecified: Secondary | ICD-10-CM | POA: Diagnosis not present

## 2022-12-20 DIAGNOSIS — E1165 Type 2 diabetes mellitus with hyperglycemia: Secondary | ICD-10-CM | POA: Diagnosis not present

## 2022-12-20 DIAGNOSIS — Z992 Dependence on renal dialysis: Secondary | ICD-10-CM | POA: Insufficient documentation

## 2022-12-20 DIAGNOSIS — K209 Esophagitis, unspecified without bleeding: Principal | ICD-10-CM | POA: Insufficient documentation

## 2022-12-20 DIAGNOSIS — R9431 Abnormal electrocardiogram [ECG] [EKG]: Secondary | ICD-10-CM | POA: Diagnosis not present

## 2022-12-20 DIAGNOSIS — I160 Hypertensive urgency: Secondary | ICD-10-CM | POA: Insufficient documentation

## 2022-12-20 DIAGNOSIS — E1122 Type 2 diabetes mellitus with diabetic chronic kidney disease: Secondary | ICD-10-CM | POA: Diagnosis not present

## 2022-12-20 DIAGNOSIS — Z79899 Other long term (current) drug therapy: Secondary | ICD-10-CM | POA: Diagnosis not present

## 2022-12-20 DIAGNOSIS — R1084 Generalized abdominal pain: Secondary | ICD-10-CM

## 2022-12-20 DIAGNOSIS — Z794 Long term (current) use of insulin: Secondary | ICD-10-CM | POA: Diagnosis not present

## 2022-12-20 DIAGNOSIS — I120 Hypertensive chronic kidney disease with stage 5 chronic kidney disease or end stage renal disease: Secondary | ICD-10-CM | POA: Insufficient documentation

## 2022-12-20 DIAGNOSIS — R519 Headache, unspecified: Secondary | ICD-10-CM

## 2022-12-20 DIAGNOSIS — K208 Other esophagitis without bleeding: Secondary | ICD-10-CM | POA: Diagnosis present

## 2022-12-20 DIAGNOSIS — N186 End stage renal disease: Secondary | ICD-10-CM | POA: Insufficient documentation

## 2022-12-20 LAB — CBC
HCT: 42.2 % (ref 36.0–46.0)
Hemoglobin: 13.4 g/dL (ref 12.0–15.0)
MCH: 30.7 pg (ref 26.0–34.0)
MCHC: 31.8 g/dL (ref 30.0–36.0)
MCV: 96.8 fL (ref 80.0–100.0)
Platelets: 231 10*3/uL (ref 150–400)
RBC: 4.36 MIL/uL (ref 3.87–5.11)
RDW: 15.4 % (ref 11.5–15.5)
WBC: 10.2 10*3/uL (ref 4.0–10.5)
nRBC: 0 % (ref 0.0–0.2)

## 2022-12-20 LAB — COMPREHENSIVE METABOLIC PANEL
ALT: 16 U/L (ref 0–44)
AST: 21 U/L (ref 15–41)
Albumin: 4.5 g/dL (ref 3.5–5.0)
Alkaline Phosphatase: 65 U/L (ref 38–126)
Anion gap: 15 (ref 5–15)
BUN: 33 mg/dL — ABNORMAL HIGH (ref 6–20)
CO2: 28 mmol/L (ref 22–32)
Calcium: 8.9 mg/dL (ref 8.9–10.3)
Chloride: 95 mmol/L — ABNORMAL LOW (ref 98–111)
Creatinine, Ser: 9.61 mg/dL — ABNORMAL HIGH (ref 0.44–1.00)
GFR, Estimated: 5 mL/min — ABNORMAL LOW (ref >60)
Glucose, Bld: 329 mg/dL — ABNORMAL HIGH (ref 70–99)
Potassium: 4.3 mmol/L (ref 3.5–5.1)
Sodium: 138 mmol/L (ref 135–145)
Total Bilirubin: 0.9 mg/dL (ref 0.3–1.2)
Total Protein: 9.9 g/dL — ABNORMAL HIGH (ref 6.5–8.1)

## 2022-12-20 LAB — LIPASE, BLOOD: Lipase: 47 U/L (ref 11–51)

## 2022-12-20 LAB — CBG MONITORING, ED: Glucose-Capillary: 344 mg/dL — ABNORMAL HIGH (ref 70–99)

## 2022-12-20 LAB — HCG, SERUM, QUALITATIVE: Preg, Serum: NEGATIVE

## 2022-12-20 MED ORDER — PROCHLORPERAZINE EDISYLATE 10 MG/2ML IJ SOLN
10.0000 mg | INTRAMUSCULAR | Status: AC
  Administered 2022-12-20: 10 mg via INTRAVENOUS
  Filled 2022-12-20: qty 2

## 2022-12-20 MED ORDER — HYDRALAZINE HCL 20 MG/ML IJ SOLN
20.0000 mg | Freq: Once | INTRAMUSCULAR | Status: AC
  Administered 2022-12-20: 20 mg via INTRAVENOUS
  Filled 2022-12-20: qty 1

## 2022-12-20 MED ORDER — MORPHINE SULFATE (PF) 4 MG/ML IV SOLN
6.0000 mg | Freq: Once | INTRAVENOUS | Status: AC
  Administered 2022-12-20: 6 mg via INTRAVENOUS
  Filled 2022-12-20: qty 2

## 2022-12-20 MED ORDER — DIAZEPAM 5 MG/ML IJ SOLN
2.5000 mg | INTRAMUSCULAR | Status: AC
  Administered 2022-12-20: 2.5 mg via INTRAVENOUS
  Filled 2022-12-20: qty 2

## 2022-12-20 MED ORDER — PANTOPRAZOLE SODIUM 40 MG IV SOLR
40.0000 mg | Freq: Once | INTRAVENOUS | Status: AC
  Administered 2022-12-21: 40 mg via INTRAVENOUS
  Filled 2022-12-20: qty 10

## 2022-12-20 MED ORDER — HYDROMORPHONE HCL 1 MG/ML IJ SOLN
1.0000 mg | Freq: Once | INTRAMUSCULAR | Status: AC
  Administered 2022-12-20: 1 mg via INTRAVENOUS
  Filled 2022-12-20: qty 1

## 2022-12-20 MED ORDER — LABETALOL HCL 5 MG/ML IV SOLN
20.0000 mg | Freq: Once | INTRAVENOUS | Status: AC
  Administered 2022-12-20: 20 mg via INTRAVENOUS
  Filled 2022-12-20: qty 4

## 2022-12-20 MED ORDER — HYDRALAZINE HCL 20 MG/ML IJ SOLN
10.0000 mg | Freq: Once | INTRAMUSCULAR | Status: AC
  Administered 2022-12-20: 10 mg via INTRAVENOUS
  Filled 2022-12-20: qty 1

## 2022-12-20 MED ORDER — SUCRALFATE 1 GM/10ML PO SUSP
1.0000 g | Freq: Once | ORAL | Status: AC
  Administered 2022-12-21: 1 g via ORAL
  Filled 2022-12-20: qty 10

## 2022-12-20 MED ORDER — CHLORHEXIDINE GLUCONATE CLOTH 2 % EX PADS: 6.0000 | MEDICATED_PAD | Freq: Every day | CUTANEOUS | Status: DC

## 2022-12-20 MED ORDER — LIDOCAINE VISCOUS HCL 2 % MT SOLN
10.0000 mL | Freq: Once | OROMUCOSAL | Status: AC
  Administered 2022-12-21: 10 mL via OROMUCOSAL
  Filled 2022-12-20: qty 15

## 2022-12-20 NOTE — ED Notes (Signed)
After receiving pain medication, pt required [place] at 2 lpm for supplemental O2.

## 2022-12-20 NOTE — ED Triage Notes (Signed)
Pt complains of generalized abdominal pain with nausea/vomiting x2 days. Pt has ESRD on diaylsis and history of gastroparesis. States she has not been able to keep don BP meds

## 2022-12-20 NOTE — ED Notes (Signed)
MD notified of pt ongoing hypertension despite hydralazine administration. MD also notified of pt request for more pain medication.

## 2022-12-20 NOTE — ED Provider Notes (Signed)
[place] EMERGENCY DEPARTMENT AT [facility] Provider Note   CSN: [id number] Arrival date & time: 12/20/22  1327     History {Add pertinent medical, surgical, social history, OB history to HPI:1} Chief Complaint  Patient presents with   Abdominal Pain   Vomiting   Dizziness    [name] is a 37 y.o. female.  37 year old female with history of ESRD status post renal transplant that failed currently on IHD TTS, gastroparesis, and esophageal stricture who presents to the emergency department with nausea and vomiting.  For several days has had nausea and vomiting.  Was seen in the emergency department on 12/13/2022 and had a CT scan that did not show any evidence of obstruction.  Was sent home and was doing well until yesterday when she started having similar symptoms.  Came into the emergency department and had her nausea and vomiting controlled and then went home and reports that she has had severe abdominal pain nausea and vomiting.  Says that the abdominal pain is typical of her gastroparesis.  Also is having headache.  Has been compliant with her dialysis and went on Saturday.  Has tried dicyclomine and Phenergan without improvement of her symptoms. Has had extensive abdominal surgeries including kidney transplant that was reversed, cholecystectomy, and pancreatic surgery per the patient.  Currently awaiting esophageal stricture stenting.       Home Medications Prior to Admission medications   Medication Sig Start Date End Date Taking? Authorizing Provider  acetaminophen (TYLENOL) 500 MG tablet Take 500-1,000 mg by mouth every 6 (six) hours as needed for moderate pain.    [provider]  Calcium Carbonate Antacid (CALCIUM CARBONATE, DOSED IN MG ELEMENTAL CALCIUM,) 1250 MG/5ML SUSP Take 5 mLs by mouth daily. 10/22/20   [provider]  cetirizine (ZYRTEC) 10 MG tablet Take 10 mg by mouth daily as needed for allergies.    [provider]   dicyclomine (BENTYL) 20 MG tablet Take 1 tablet (20 mg total) by mouth 2 (two) times daily for 5 days. 12/13/22 12/18/22  Schutt, Edsel Petrin, PA-C  doxycycline (VIBRA-TABS) 100 MG tablet Take 1 tablet (100 mg total) by mouth 2 (two) times daily. 04/29/22   Nadara Mustard, MD  doxycycline (VIBRAMYCIN) 100 MG capsule Take 1 capsule (100 mg total) by mouth 2 (two) times daily. 04/09/22   Adonis Huguenin, NP  famotidine (PEPCID) 40 MG tablet Take 1 tablet (40 mg total) by mouth 2 (two) times daily as needed for heartburn or indigestion. Patient taking differently: Take 40 mg by mouth daily. 01/01/22   Lurene Shadow, MD  fluticasone (FLONASE) 50 MCG/ACT nasal spray Place 2 sprays into both nostrils daily as needed for allergies. 09/03/18   Glade Lloyd, MD  gabapentin (NEURONTIN) 600 MG tablet Take 600 mg by mouth 3 (three) times daily. 12/30/20   [provider]  HYDROcodone-acetaminophen (NORCO) 5-325 MG tablet Take 1 tablet by mouth every 6 (six) hours as needed. 04/09/22   Adonis Huguenin, NP  insulin aspart (NOVOLOG FLEXPEN) 100 UNIT/ML FlexPen Inject 0-15 Units into the skin in the morning, at noon, in the evening, and at bedtime. Sliding Scale insulin    [provider]  insulin degludec (TRESIBA) 100 UNIT/ML FlexTouch Pen Inject 5-10 Units into the skin daily. Patient taking differently: Inject 7 Units into the skin daily. 01/01/22   Lurene Shadow, MD  lidocaine-prilocaine (EMLA) cream Apply 1 application topically daily as needed [facility]). 12/17/20   [provider]  loperamide (IMODIUM) 2 MG capsule Take 2 mg by mouth as needed for diarrhea or loose stools. 09/11/20   [provider]  meclizine (ANTIVERT) 25 MG tablet Take 1 tablet (25 mg total) by mouth 3 (three) times daily as needed for dizziness. 06/10/22   Mesner, Barbara Cower, MD  metoprolol tartrate (LOPRESSOR) 25 MG tablet Take 1 tablet (25 mg total) by mouth daily as needed (for systolic blood pressure or 135 or  above). 01/01/22   Lurene Shadow, MD  omeprazole (PRILOSEC) 40 MG capsule Take 1 capsule (40 mg total) by mouth 2 (two) times daily before a meal. Open capsule and place granules in water or applesauce to swallow 03/17/21   Iva Boop, MD  ondansetron (ZOFRAN-ODT) 4 MG disintegrating tablet Take 1 tablet (4 mg total) by mouth every 8 (eight) hours as needed for nausea or vomiting. 12/13/22   Schutt, Edsel Petrin, PA-C  oxyCODONE-acetaminophen (PERCOCET/ROXICET) 5-325 MG tablet Take 1 tablet by mouth daily as needed for severe pain. 01/01/22   Lurene Shadow, MD  promethazine (PHENERGAN) 25 MG tablet Take 1 tablet (25 mg total) by mouth every 6 (six) hours as needed for nausea or vomiting. 12/19/22   Sabas Sous, MD  RENVELA 2.4 g PACK Take 2.4 g by mouth 3 (three) times daily. 05/04/21   [provider]  simvastatin (ZOCOR) 20 MG tablet Take 20 mg by mouth at bedtime.    [provider]      Allergies    Diphenhydramine, Motrin [ibuprofen], Peanut-containing drug products, Contrast media [iodinated contrast media], Shellfish allergy, Banana, Chlorhexidine, Ferrous sulfate, and Iron dextran    Review of Systems   Review of Systems  Physical Exam Updated Vital Signs BP (!) 233/138 (BP Location: Right Arm)   Pulse 97   Temp 98.4 F (36.9 C) (Oral)   Resp 18   Ht 5\' 6"  (1.676 m)   Wt 81 kg   SpO2 100%   BMI 28.82 kg/m  Physical Exam Vitals and nursing note reviewed.  Constitutional:      General: She is not in acute distress.    Appearance: She is well-developed.     Comments: Uncomfortable appearing  HENT:     Head: Normocephalic and atraumatic.     Right Ear: External ear normal.     Left Ear: External ear normal.     Nose: Nose normal.  Eyes:     Extraocular Movements: Extraocular movements intact.     Conjunctiva/sclera: Conjunctivae normal.     Pupils: Pupils are equal, round, and reactive to light.  Cardiovascular:     Comments: Fistula in left upper  extremity with bruit and thrill Pulmonary:     Effort: Pulmonary effort is normal. No respiratory distress.     Breath sounds: Normal breath sounds.  Abdominal:     General: Abdomen is flat. There is no distension.     Palpations: Abdomen is soft. There is no mass.     Tenderness: There is abdominal tenderness (Diffuse). There is no guarding.  Musculoskeletal:     Cervical back: Normal range of motion and neck supple.     Right lower leg: No edema.     Left lower leg: No edema.  Skin:    General: Skin is warm and dry.  Neurological:     Mental Status: She is alert and oriented to person, place, and time. Mental status is at baseline.  Psychiatric:        Mood and Affect: Mood normal.  ED Results / Procedures / Treatments   Labs (all labs ordered are listed, but only abnormal results are displayed) Labs Reviewed  LIPASE, BLOOD  COMPREHENSIVE METABOLIC PANEL  CBC  URINALYSIS, ROUTINE W REFLEX MICROSCOPIC  HCG, SERUM, QUALITATIVE  CBG MONITORING, ED    EKG None  Radiology No results found.  Procedures Procedures  {Document cardiac monitor, telemetry assessment procedure when appropriate:1}  Medications Ordered in ED Medications  diazepam (VALIUM) injection 2.5 mg (has no administration in time range)    ED Course/ Medical Decision Making/ A&P Clinical Course as of 12/20/22 2044  Mon Dec 20, 2022  2041 Dr Juel Burrow [RP]    Clinical Course User Index [RP] Rondel Baton, MD   {   Click here for ABCD2, HEART and other calculatorsREFRESH Note before signing :1}                              Medical Decision Making Amount and/or Complexity of Data Reviewed Labs: ordered. Radiology: ordered.  Risk Prescription drug management.   ***  {Document critical care time when appropriate:1} {Document review of labs and clinical decision tools ie heart score, Chads2Vasc2 etc:1}  {Document your independent review of radiology images, and any outside  records:1} {Document your discussion with family members, caretakers, and with consultants:1} {Document social determinants of health affecting pt's care:1} {Document your decision making why or why not admission, treatments were needed:1} Final Clinical Impression(s) / ED Diagnoses Final diagnoses:  None    Rx / DC Orders ED Discharge Orders     None

## 2022-12-20 NOTE — ED Provider Notes (Signed)
   Physical Exam  BP (!) 146/101   Pulse (!) 110   Temp 98.8 F (37.1 C) (Oral)   Resp 13   Ht 5\' 6"  (1.676 m)   Wt 81 kg   SpO2 97%   BMI 28.82 kg/m   Physical Exam  Procedures  Procedures  ED Course / MDM   Clinical Course as of 12/20/22 2208  [facility] Dec 20, 2022  2041 Dr Juel Burrow from nephrology aware [RP]    Clinical Course User Index [RP] Rondel Baton, MD   Medical Decision Making Amount and/or Complexity of Data Reviewed Labs: ordered. Radiology: ordered.  Risk Prescription drug management. Decision regarding hospitalization.   Patient received in signout from earlier provider with concern for intractable nausea and vomiting.  Pending CT imaging.  This imaging was reviewed with concern for potential esophagitis, some gastric distention.  Patient blood pressure did improve with pain medications but she required multiple rounds of the ED, as well as antiemetics.  Because she continues have intractable pain and vomiting she will be admitted to the hospitalist.  She will need transfer to [facility] as she is a dialysis patient, typically goes on Tuesday Thursday and Saturday has not missed any dialysis recently.  Dr Jarold Motto EDP had discussed with Dr Haig Prophet from nephrology        Terald Sleeper, MD 12/20/22 2209

## 2022-12-20 NOTE — Inpatient Diabetes Management (Signed)
Inpatient Diabetes Program Recommendations  AACE/ADA: New Consensus Statement on Inpatient Glycemic Control (2015)  Target Ranges:  Prepandial:   less than 140 mg/dL      Peak postprandial:   less than 180 mg/dL (1-2 hours)      Critically ill patients:  140 - 180 mg/dL   Lab Results  Component Value Date   GLUCAP 344 (H) 12/20/2022   HGBA1C 9.1 (H) 03/30/2022    Review of Glycemic Control  Latest Reference Range & Units 12/19/22 03:10 12/20/22 14:47  Glucose-Capillary 70 - 99 mg/dL 782 (H) 956 (H)   Diabetes history: DM 2 Outpatient Diabetes medications: Tresiba 7 units, Novolog 0-15 units tid and hs Current orders for Inpatient glycemic control:  None  Inpatient Diabetes Program Recommendations:    If pt is admitted for further testing please consider:  -   Novolog 0-6 units tid + hs -   Semglee 5 units  Thanks,  Christena Deem RN, MSN, BC-ADM Inpatient Diabetes Coordinator Team Pager [phone] (8a-5p)

## 2022-12-20 NOTE — ED Provider Triage Note (Signed)
Emergency Medicine Provider Triage Evaluation Note  [name] , a 37 y.o. female  was evaluated in triage.  Pt complains of headache, n/v, feels like she may have a seizure. Last iHD was on Saturday.   Review of Systems  Positive: Headache, n/v  Physical Exam  BP (!) 233/138 (BP Location: Right Arm)   Pulse 97   Temp 98.4 F (36.9 C) (Oral)   Resp 18   Ht 5\' 6"  (1.676 m)   Wt 81 kg   SpO2 100%   BMI 28.82 kg/m  Gen:   Awake, uncomfortable, crying Resp:  Normal effort  MSK:   Moves extremities without difficulty  Other:  Nauseous holding emesis basin  Medical Decision Making  Medically screening exam initiated at 1:58 PM.  Appropriate orders placed.  DERHONDA EASTLICK was informed that the remainder of the evaluation will be completed by another provider, this initial triage assessment does not replace that evaluation, and the importance of remaining in the ED until their evaluation is complete.  Charge nurse notified that patient needs room immediately    Rondel Baton, MD 12/20/22 1400

## 2022-12-21 ENCOUNTER — Observation Stay (HOSPITAL_COMMUNITY): Payer: 59

## 2022-12-21 DIAGNOSIS — Z794 Long term (current) use of insulin: Secondary | ICD-10-CM | POA: Diagnosis not present

## 2022-12-21 DIAGNOSIS — Z992 Dependence on renal dialysis: Secondary | ICD-10-CM | POA: Diagnosis not present

## 2022-12-21 DIAGNOSIS — R9431 Abnormal electrocardiogram [ECG] [EKG]: Secondary | ICD-10-CM | POA: Diagnosis not present

## 2022-12-21 DIAGNOSIS — R519 Headache, unspecified: Secondary | ICD-10-CM

## 2022-12-21 DIAGNOSIS — R112 Nausea with vomiting, unspecified: Secondary | ICD-10-CM | POA: Diagnosis not present

## 2022-12-21 DIAGNOSIS — K209 Esophagitis, unspecified without bleeding: Secondary | ICD-10-CM | POA: Diagnosis not present

## 2022-12-21 DIAGNOSIS — Z91010 Allergy to peanuts: Secondary | ICD-10-CM | POA: Diagnosis not present

## 2022-12-21 DIAGNOSIS — E1165 Type 2 diabetes mellitus with hyperglycemia: Secondary | ICD-10-CM | POA: Diagnosis not present

## 2022-12-21 DIAGNOSIS — E1122 Type 2 diabetes mellitus with diabetic chronic kidney disease: Secondary | ICD-10-CM | POA: Diagnosis not present

## 2022-12-21 DIAGNOSIS — Z79899 Other long term (current) drug therapy: Secondary | ICD-10-CM | POA: Diagnosis not present

## 2022-12-21 DIAGNOSIS — I160 Hypertensive urgency: Secondary | ICD-10-CM | POA: Diagnosis not present

## 2022-12-21 DIAGNOSIS — R42 Dizziness and giddiness: Secondary | ICD-10-CM | POA: Diagnosis not present

## 2022-12-21 DIAGNOSIS — I120 Hypertensive chronic kidney disease with stage 5 chronic kidney disease or end stage renal disease: Secondary | ICD-10-CM | POA: Diagnosis not present

## 2022-12-21 DIAGNOSIS — N186 End stage renal disease: Secondary | ICD-10-CM | POA: Diagnosis not present

## 2022-12-21 LAB — GLUCOSE, CAPILLARY: Glucose-Capillary: 140 mg/dL — ABNORMAL HIGH (ref 70–99)

## 2022-12-21 LAB — MAGNESIUM: Magnesium: 2.1 mg/dL (ref 1.7–2.4)

## 2022-12-21 LAB — BASIC METABOLIC PANEL
Anion gap: 15 (ref 5–15)
BUN: 46 mg/dL — ABNORMAL HIGH (ref 6–20)
CO2: 26 mmol/L (ref 22–32)
Calcium: 8 mg/dL — ABNORMAL LOW (ref 8.9–10.3)
Chloride: 94 mmol/L — ABNORMAL LOW (ref 98–111)
Creatinine, Ser: 11.12 mg/dL — ABNORMAL HIGH (ref 0.44–1.00)
GFR, Estimated: 4 mL/min — ABNORMAL LOW (ref >60)
Glucose, Bld: 382 mg/dL — ABNORMAL HIGH (ref 70–99)
Potassium: 4.3 mmol/L (ref 3.5–5.1)
Sodium: 135 mmol/L (ref 135–145)

## 2022-12-21 LAB — CBG MONITORING, ED
Glucose-Capillary: 371 mg/dL — ABNORMAL HIGH (ref 70–99)
Glucose-Capillary: 271 mg/dL — ABNORMAL HIGH (ref 70–99)
Glucose-Capillary: 134 mg/dL — ABNORMAL HIGH (ref 70–99)
Glucose-Capillary: 217 mg/dL — ABNORMAL HIGH (ref 70–99)
Glucose-Capillary: 171 mg/dL — ABNORMAL HIGH (ref 70–99)

## 2022-12-21 LAB — CBC
HCT: 37.5 % (ref 36.0–46.0)
HCT: 33.7 % — ABNORMAL LOW (ref 36.0–46.0)
Hemoglobin: 11.7 g/dL — ABNORMAL LOW (ref 12.0–15.0)
Hemoglobin: 10.4 g/dL — ABNORMAL LOW (ref 12.0–15.0)
MCH: 30.2 pg (ref 26.0–34.0)
MCH: 29.5 pg (ref 26.0–34.0)
MCHC: 31.2 g/dL (ref 30.0–36.0)
MCHC: 30.9 g/dL (ref 30.0–36.0)
MCV: 96.9 fL (ref 80.0–100.0)
MCV: 95.7 fL (ref 80.0–100.0)
Platelets: 213 10*3/uL (ref 150–400)
Platelets: 174 10*3/uL (ref 150–400)
RBC: 3.87 MIL/uL (ref 3.87–5.11)
RBC: 3.52 MIL/uL — ABNORMAL LOW (ref 3.87–5.11)
RDW: 15.8 % — ABNORMAL HIGH (ref 11.5–15.5)
RDW: 15.9 % — ABNORMAL HIGH (ref 11.5–15.5)
WBC: 11.5 10*3/uL — ABNORMAL HIGH (ref 4.0–10.5)
WBC: 11.6 10*3/uL — ABNORMAL HIGH (ref 4.0–10.5)
nRBC: 0 % (ref 0.0–0.2)
nRBC: 0 % (ref 0.0–0.2)

## 2022-12-21 LAB — HEMOGLOBIN A1C
Hgb A1c MFr Bld: 9.9 % — ABNORMAL HIGH (ref 4.8–5.6)
Mean Plasma Glucose: 237.43 mg/dL

## 2022-12-21 LAB — HEPATITIS B SURFACE ANTIGEN: Hepatitis B Surface Ag: NONREACTIVE

## 2022-12-21 LAB — HIV ANTIBODY (ROUTINE TESTING W REFLEX): HIV Screen 4th Generation wRfx: NONREACTIVE

## 2022-12-21 MED ORDER — ALTEPLASE 2 MG IJ SOLR: 2.0000 mg | Freq: Once | INTRAMUSCULAR | Status: DC | PRN

## 2022-12-21 MED ORDER — SUCRALFATE 1 G PO TABS
1.0000 g | ORAL_TABLET | Freq: Two times a day (BID) | ORAL | Status: DC
  Administered 2022-12-21 – 2022-12-22 (×3): 1 g via ORAL
  Filled 2022-12-21 (×3): qty 1

## 2022-12-21 MED ORDER — ACETAMINOPHEN 325 MG PO TABS: 650.0000 mg | ORAL_TABLET | Freq: Four times a day (QID) | ORAL | Status: DC | PRN

## 2022-12-21 MED ORDER — ANTICOAGULANT SODIUM CITRATE 4% (200MG/5ML) IV SOLN: 5.0000 mL | Status: DC | PRN

## 2022-12-21 MED ORDER — INSULIN GLARGINE-YFGN 100 UNIT/ML ~~LOC~~ SOLN
5.0000 [IU] | Freq: Every day | SUBCUTANEOUS | Status: DC
  Administered 2022-12-21: 5 [IU] via SUBCUTANEOUS
  Filled 2022-12-21 (×4): qty 0.05

## 2022-12-21 MED ORDER — HYDRALAZINE HCL 50 MG PO TABS: 50.0000 mg | ORAL_TABLET | Freq: Four times a day (QID) | ORAL | Status: DC | PRN

## 2022-12-21 MED ORDER — HEPARIN SODIUM (PORCINE) 1000 UNIT/ML DIALYSIS
4000.0000 [IU] | Freq: Once | INTRAMUSCULAR | Status: AC
  Administered 2022-12-21: 4000 [IU] via INTRAVENOUS_CENTRAL
  Filled 2022-12-21: qty 4

## 2022-12-21 MED ORDER — PENTAFLUOROPROP-TETRAFLUOROETH EX AERO: 1.0000 | INHALATION_SPRAY | CUTANEOUS | Status: DC | PRN

## 2022-12-21 MED ORDER — SODIUM CHLORIDE 0.9% FLUSH
3.0000 mL | Freq: Two times a day (BID) | INTRAVENOUS | Status: DC
  Administered 2022-12-21 – 2022-12-22 (×4): 3 mL via INTRAVENOUS

## 2022-12-21 MED ORDER — PROCHLORPERAZINE EDISYLATE 10 MG/2ML IJ SOLN: 5.0000 mg | Freq: Four times a day (QID) | INTRAMUSCULAR | Status: DC | PRN

## 2022-12-21 MED ORDER — HEPARIN SODIUM (PORCINE) 1000 UNIT/ML DIALYSIS: 1000.0000 [IU] | INTRAMUSCULAR | Status: DC | PRN

## 2022-12-21 MED ORDER — HYDROMORPHONE HCL 1 MG/ML IJ SOLN
0.5000 mg | INTRAMUSCULAR | Status: DC | PRN
  Administered 2022-12-21 – 2022-12-22 (×3): 0.5 mg via INTRAVENOUS
  Filled 2022-12-21: qty 1
  Filled 2022-12-21 (×2): qty 0.5

## 2022-12-21 MED ORDER — LIDOCAINE-PRILOCAINE 2.5-2.5 % EX CREA: 1.0000 | TOPICAL_CREAM | CUTANEOUS | Status: DC | PRN

## 2022-12-21 MED ORDER — HEPARIN SODIUM (PORCINE) 5000 UNIT/ML IJ SOLN
5000.0000 [IU] | Freq: Three times a day (TID) | INTRAMUSCULAR | Status: DC
  Administered 2022-12-21 – 2022-12-22 (×3): 5000 [IU] via SUBCUTANEOUS
  Filled 2022-12-21 (×4): qty 1

## 2022-12-21 MED ORDER — LIDOCAINE HCL (PF) 1 % IJ SOLN: 5.0000 mL | INTRAMUSCULAR | Status: DC | PRN

## 2022-12-21 MED ORDER — METOPROLOL TARTRATE 25 MG PO TABS
25.0000 mg | ORAL_TABLET | Freq: Two times a day (BID) | ORAL | Status: DC
  Administered 2022-12-21 – 2022-12-22 (×2): 25 mg via ORAL
  Filled 2022-12-21 (×2): qty 1

## 2022-12-21 MED ORDER — INSULIN ASPART 100 UNIT/ML IJ SOLN
0.0000 [IU] | INTRAMUSCULAR | Status: DC
  Administered 2022-12-21: 2 [IU] via SUBCUTANEOUS
  Administered 2022-12-21: 1 [IU] via SUBCUTANEOUS
  Administered 2022-12-21: 5 [IU] via SUBCUTANEOUS
  Administered 2022-12-21: 3 [IU] via SUBCUTANEOUS
  Filled 2022-12-21: qty 0.06

## 2022-12-21 MED ORDER — HYDROXYZINE HCL 25 MG PO TABS
25.0000 mg | ORAL_TABLET | Freq: Two times a day (BID) | ORAL | Status: DC | PRN
  Filled 2022-12-21: qty 1

## 2022-12-21 MED ORDER — PANTOPRAZOLE SODIUM 40 MG PO TBEC
40.0000 mg | DELAYED_RELEASE_TABLET | Freq: Two times a day (BID) | ORAL | Status: DC
  Administered 2022-12-21 – 2022-12-22 (×3): 40 mg via ORAL
  Filled 2022-12-21 (×3): qty 1

## 2022-12-21 MED ORDER — ACETAMINOPHEN 650 MG RE SUPP: 650.0000 mg | Freq: Four times a day (QID) | RECTAL | Status: DC | PRN

## 2022-12-21 MED ORDER — BOOST / RESOURCE BREEZE PO LIQD CUSTOM
1.0000 | Freq: Three times a day (TID) | ORAL | Status: DC
  Administered 2022-12-21: 1 via ORAL

## 2022-12-21 MED ORDER — HEPARIN SODIUM (PORCINE) 1000 UNIT/ML DIALYSIS
2500.0000 [IU] | Freq: Once | INTRAMUSCULAR | Status: AC
  Administered 2022-12-21: 2500 [IU] via INTRAVENOUS_CENTRAL
  Filled 2022-12-21: qty 3

## 2022-12-21 NOTE — ED Notes (Signed)
ED TO INPATIENT HANDOFF REPORT  ED Nurse Name and Phone #:  Mellody Dance  [phone]  S Name/Age/Gender [name] 37 y.o. female Room/Bed: WA24/WA24  Code Status   Code Status: Full Code  Home/SNF/Other Home Patient oriented to: self, place, time, and situation Is this baseline? Yes   Triage Complete: Triage complete  Chief Complaint Intractable nausea and vomiting [R11.2]  Triage Note Pt complains of generalized abdominal pain with nausea/vomiting x2 days. Pt has ESRD on diaylsis and history of gastroparesis. States she has not been able to keep don BP meds   Allergies Allergies  Allergen Reactions   Diphenhydramine Anaphylaxis   Motrin [Ibuprofen] Shortness Of Breath and Itching   Peanut-Containing Drug Products Itching and Hives    Able to tolerate when cooked in foods   Contrast Media [Iodinated Contrast Media] Itching   Shellfish Allergy Hives   Banana Itching, Nausea And Vomiting and Other (See Comments)    Sick on the stomach   Chlorhexidine Itching   Ferrous Sulfate Itching   Iron Dextran Itching and Other (See Comments)    Vein irritation     Level of Care/Admitting Diagnosis ED Disposition     ED Disposition  Admit   Condition  --   Comment  Hospital Area: [facility] [[id number]]  Level of Care: Progressive [102]  Admit to Progressive based on following criteria: NEPHROLOGY stable condition requiring close monitoring for AKI, requiring Hemodialysis or Peritoneal Dialysis either from expected electrolyte imbalance, acidosis, or fluid overload that can be managed by NIPPV or high flow oxygen.  May place patient in observation at [facility] or [facility] if equivalent level of care is available:: No  Covid Evaluation: Asymptomatic - no recent exposure (last 10 days) testing not required  Diagnosis: Intractable nausea and vomiting [720114]  Admitting Physician: Briscoe Deutscher [[id number]]  Attending Physician: Briscoe Deutscher [[id number]]           B Medical/Surgery History Past Medical History:  Diagnosis Date   Anemia    Blood transfusion without reported diagnosis    Cellulitis of left leg 03/01/2018   Chronic kidney disease    kidney transplant 07   Diabetes mellitus    Pt reports diagnosis in June 2011, Type 2   Diabetes mellitus without complication (HCC)    Esophageal obstruction due to food impaction    GERD (gastroesophageal reflux disease)    Hyperlipidemia    Hypertension    Intra-abdominal abscess (HCC) 10/28/2018   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   LEARNING DISABILITY 09/25/2007   Qualifier: Diagnosis of  By: Dayton Martes MD, Talia     Nausea and vomiting    Prolonged Q-T interval on ECG    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   Pyelonephritis 06/23/2014   Renal and perinephric abscess 11/01/2018   Renal disorder    Seasonal allergies    Seizures (HCC)    UTI (urinary tract infection) 01/09/2015   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   Qualifier: Diagnosis of  By: Sandi Mealy  MD, Judeth Cornfield     Past Surgical History:  Procedure Laterality Date   AMPUTATION Left 04/02/2022   Procedure: AMPUTATION LEFT GREAT TOE;  Surgeon: Nadara Mustard, MD;  Location: [facility] OR;  Service: Orthopedics;  Laterality: Left;   ARTERIOVENOUS GRAFT PLACEMENT Bilateral    "neither work" (10/24/2017)   AV FISTULA PLACEMENT Left 10/26/2018   Procedure: CREATION OF ARTERIOVENOUS FISTULA  LEFT ARM;  Surgeon: Cephus Shelling, MD;  Location: [facility]  OR;  Service: Vascular;  Laterality: Left;   AV FISTULA PLACEMENT Left 05/23/2020   Procedure: LEFT ARM ARTERIOVENOUS (AV) FISTULA CREATION;  Surgeon: Cephus Shelling, MD;  Location: [facility] OR;  Service: Vascular;  Laterality: Left;   BALLOON DILATION N/A 01/19/2021   Procedure: BALLOON DILATION;  Surgeon: Iva Boop, MD;  Location: [facility] ENDOSCOPY;  Service: Endoscopy;  Laterality: N/A;   BALLOON DILATION N/A 02/10/2021   Procedure: BALLOON DILATION;  Surgeon: Hilarie Fredrickson, MD;  Location: [facility]  ENDOSCOPY;  Service: Endoscopy;  Laterality: N/A;   BALLOON DILATION N/A 02/19/2021   Procedure: BALLOON DILATION;  Surgeon: Imogene Burn, MD;  Location: [facility] ENDOSCOPY;  Service: Gastroenterology;  Laterality: N/A;   BASCILIC VEIN TRANSPOSITION Left 12/21/2018   Procedure: Left arm BASILIC VEIN TRANSPOSITION SECOND STAGE;  Surgeon: Cephus Shelling, MD;  Location: [facility] OR;  Service: Vascular;  Laterality: Left;   BIOPSY  12/29/2021   Procedure: BIOPSY;  Surgeon: Meryl Dare, MD;  Location: [facility] ENDOSCOPY;  Service: Gastroenterology;;   CHOLECYSTECTOMY N/A 06/30/2017   Procedure: LAPAROSCOPIC CHOLECYSTECTOMY WITH INTRAOPERATIVE CHOLANGIOGRAM;  Surgeon: Glenna Fellows, MD;  Location: [facility] ORS;  Service: General;  Laterality: N/A;   ESOPHAGOGASTRODUODENOSCOPY (EGD) WITH PROPOFOL N/A 07/04/2017   Procedure: ESOPHAGOGASTRODUODENOSCOPY (EGD) WITH PROPOFOL;  Surgeon: Vida Rigger, MD;  Location: [facility] ENDOSCOPY;  Service: Endoscopy;  Laterality: N/A;   ESOPHAGOGASTRODUODENOSCOPY (EGD) WITH PROPOFOL N/A 08/10/2020   Procedure: ESOPHAGOGASTRODUODENOSCOPY (EGD) WITH PROPOFOL;  Surgeon: Sherrilyn Rist, MD;  Location: [facility] ENDOSCOPY;  Service: Gastroenterology;  Laterality: N/A;   ESOPHAGOGASTRODUODENOSCOPY (EGD) WITH PROPOFOL N/A 01/19/2021   Procedure: ESOPHAGOGASTRODUODENOSCOPY (EGD) WITH PROPOFOL;  Surgeon: Iva Boop, MD;  Location: [facility] ENDOSCOPY;  Service: Endoscopy;  Laterality: N/A;  WITH FLUOROSCOPY AND DILATION   ESOPHAGOGASTRODUODENOSCOPY (EGD) WITH PROPOFOL N/A 02/03/2021   Procedure: ESOPHAGOGASTRODUODENOSCOPY (EGD) WITH PROPOFOL;  Surgeon: Iva Boop, MD;  Location: [facility] ENDOSCOPY;  Service: Endoscopy;  Laterality: N/A;   ESOPHAGOGASTRODUODENOSCOPY (EGD) WITH PROPOFOL N/A 02/10/2021   Procedure: ESOPHAGOGASTRODUODENOSCOPY (EGD) WITH PROPOFOL;  Surgeon: Hilarie Fredrickson, MD;  Location: [facility] ENDOSCOPY;  Service: Endoscopy;  Laterality: N/A;  Balloon Dilation   ESOPHAGOGASTRODUODENOSCOPY (EGD)  WITH PROPOFOL N/A 02/19/2021   Procedure: ESOPHAGOGASTRODUODENOSCOPY (EGD) WITH PROPOFOL;  Surgeon: Imogene Burn, MD;  Location: [facility] ENDOSCOPY;  Service: Gastroenterology;  Laterality: N/A;   ESOPHAGOGASTRODUODENOSCOPY (EGD) WITH PROPOFOL N/A 03/12/2021   Procedure: ESOPHAGOGASTRODUODENOSCOPY (EGD) WITH PROPOFOL;  Surgeon: Shellia Cleverly, DO;  Location: [facility] ENDOSCOPY;  Service: Gastroenterology;  Laterality: N/A;   ESOPHAGOGASTRODUODENOSCOPY (EGD) WITH PROPOFOL N/A 12/29/2021   Procedure: ESOPHAGOGASTRODUODENOSCOPY (EGD) WITH PROPOFOL;  Surgeon: Meryl Dare, MD;  Location: [facility] ENDOSCOPY;  Service: Gastroenterology;  Laterality: N/A;   FLEXIBLE SIGMOIDOSCOPY N/A 12/29/2021   Procedure: FLEXIBLE SIGMOIDOSCOPY;  Surgeon: Meryl Dare, MD;  Location: [facility] ENDOSCOPY;  Service: Gastroenterology;  Laterality: N/A;   FOREIGN BODY REMOVAL N/A 02/03/2021   Procedure: FOREIGN BODY REMOVAL;  Surgeon: Iva Boop, MD;  Location: [facility] ENDOSCOPY;  Service: Endoscopy;  Laterality: N/A;   INSERTION OF DIALYSIS CATHETER N/A 03/20/2018   Procedure: INSERTION OF TUNNELED DIALYSIS CATHETER - RIGHT INTERANL JUGULAR PLACEMENT;  Surgeon: Chuck Hint, MD;  Location: [facility] OR;  Service: Vascular;  Laterality: N/A;   IR FLUORO GUIDE CV LINE RIGHT  04/18/2020   IR GUIDED DRAIN W CATHETER PLACEMENT  10/28/2018   KIDNEY TRANSPLANT  2007   KIDNEY TRANSPLANT Right    PARATHYROIDECTOMY  ?2012   "3/4 removed" (10/24/2017)   RENAL BIOPSY  Bilateral 2003   REVISON OF ARTERIOVENOUS FISTULA Left 09/24/2020   Procedure: LEFT UPPER ARM ARTERIOVENOUS GRAFT CREATION;  Surgeon: Cephus Shelling, MD;  Location: [facility] OR;  Service: Vascular;  Laterality: Left;   UPPER EXTREMITY VENOGRAPHY Bilateral 10/19/2018   Procedure: UPPER EXTREMITY VENOGRAPHY;  Surgeon: Cephus Shelling, MD;  Location: [facility] INVASIVE CV LAB;  Service: Cardiovascular;  Laterality: Bilateral;  Bilateral    UPPER EXTREMITY VENOGRAPHY Left 09/04/2020    Procedure: UPPER EXTREMITY VENOGRAPHY - Left Upper;  Surgeon: Cephus Shelling, MD;  Location: [facility] INVASIVE CV LAB;  Service: Cardiovascular;  Laterality: Left;     A IV Location/Drains/Wounds Patient Lines/Drains/Airways Status     Active Line/Drains/Airways     Name Placement date Placement time Site Days   Peripheral IV 12/20/22 20 G 1.88" Anterior;Distal;Right;Upper Arm 12/20/22  1507  Arm  1   Fistula / Graft Left Upper arm Arteriovenous vein graft --  --  Upper arm  --   Fistula / Graft Left Arteriovenous vein graft 09/24/20  1249  --  818            Intake/Output Last 24 hours  Intake/Output Summary (Last 24 hours) at 12/21/2022 1717 Last data filed at 12/21/2022 1006 Gross per 24 hour  Intake 3 ml  Output --  Net 3 ml    Labs/Imaging Results for orders placed or performed during the hospital encounter of 12/20/22 (from the past 48 hour(s))  POC CBG, ED     Status: Abnormal   Collection Time: 12/20/22  2:47 PM  Result Value Ref Range   Glucose-Capillary 344 (H) 70 - 99 mg/dL    Comment: Glucose reference range applies only to samples taken after fasting for at least 8 hours.  Lipase, blood     Status: None   Collection Time: 12/20/22  3:14 PM  Result Value Ref Range   Lipase 47 11 - 51 U/L    Comment: Performed at [facility], [address], [place], [place] [address]  Comprehensive metabolic panel     Status: Abnormal   Collection Time: 12/20/22  3:14 PM  Result Value Ref Range   Sodium 138 135 - 145 mmol/L   Potassium 4.3 3.5 - 5.1 mmol/L   Chloride 95 (L) 98 - 111 mmol/L   CO2 28 22 - 32 mmol/L   Glucose, Bld 329 (H) 70 - 99 mg/dL    Comment: Glucose reference range applies only to samples taken after fasting for at least 8 hours.   BUN 33 (H) 6 - 20 mg/dL   Creatinine, Ser 6.57 (H) 0.44 - 1.00 mg/dL   Calcium 8.9 8.9 - 84.6 mg/dL   Total Protein 9.9 (H) 6.5 - 8.1 g/dL   Albumin 4.5 3.5 - 5.0 g/dL   AST 21 15 - 41 U/L   ALT 16 0  - 44 U/L   Alkaline Phosphatase 65 38 - 126 U/L   Total Bilirubin 0.9 0.3 - 1.2 mg/dL   GFR, Estimated 5 (L) >60 mL/min    Comment: (NOTE) Calculated using the CKD-EPI Creatinine Equation (2021)    Anion gap 15 5 - 15    Comment: Performed at [facility], [address] [place], [place] [address]  CBC     Status: None   Collection Time: 12/20/22  3:14 PM  Result Value Ref Range   WBC 10.2 4.0 - 10.5 K/uL   RBC 4.36 3.87 - 5.11 MIL/uL   Hemoglobin 13.4 12.0 -  15.0 g/dL   HCT 16.1 09.6 - 04.5 %   MCV 96.8 80.0 - 100.0 fL   MCH 30.7 26.0 - 34.0 pg   MCHC 31.8 30.0 - 36.0 g/dL   RDW 40.9 81.1 - 91.4 %   Platelets 231 150 - 400 K/uL   nRBC 0.0 0.0 - 0.2 %    Comment: Performed at [facility], [address] [place], [place] [address]  hCG, serum, qualitative     Status: None   Collection Time: 12/20/22  3:14 PM  Result Value Ref Range   Preg, Serum NEGATIVE NEGATIVE    Comment:        THE SENSITIVITY OF THIS METHODOLOGY IS >10 mIU/mL. Performed at [facility], [address], [place], [place] [address]   Hepatitis B surface antigen     Status: None   Collection Time: 12/21/22 12:40 AM  Result Value Ref Range   Hepatitis B Surface Ag NON REACTIVE NON REACTIVE    Comment: Performed at [facility], [address], [place], [place] [address]  HIV Antibody (routine testing w rflx)     Status: None   Collection Time: 12/21/22 12:40 AM  Result Value Ref Range   HIV Screen 4th Generation wRfx Non Reactive Non Reactive    Comment: Performed at [facility], [address] [place], [place] [address]  Hemoglobin A1c     Status: Abnormal   Collection Time: 12/21/22 12:40 AM  Result Value Ref Range   Hgb A1c MFr Bld 9.9 (H) 4.8 - 5.6 %    Comment: (NOTE) Pre diabetes:          5.7%-6.4%  Diabetes:              >6.4%  Glycemic control for   <7.0% adults with diabetes    Mean Plasma Glucose 237.43 mg/dL     Comment: Performed at [facility], [address], [place], [place] [address]  Basic metabolic panel     Status: Abnormal   Collection Time: 12/21/22 12:40 AM  Result Value Ref Range   Sodium 135 135 - 145 mmol/L   Potassium 4.3 3.5 - 5.1 mmol/L   Chloride 94 (L) 98 - 111 mmol/L   CO2 26 22 - 32 mmol/L   Glucose, Bld 382 (H) 70 - 99 mg/dL    Comment: Glucose reference range applies only to samples taken after fasting for at least 8 hours.   BUN 46 (H) 6 - 20 mg/dL   Creatinine, Ser 13.24 (H) 0.44 - 1.00 mg/dL   Calcium 8.0 (L) 8.9 - 10.3 mg/dL   GFR, Estimated 4 (L) >60 mL/min    Comment: (NOTE) Calculated using the CKD-EPI Creatinine Equation (2021)    Anion gap 15 5 - 15    Comment: Performed at [facility], [address] [place], [place] [address]  CBC     Status: Abnormal   Collection Time: 12/21/22 12:40 AM  Result Value Ref Range   WBC 11.5 (H) 4.0 - 10.5 K/uL   RBC 3.87 3.87 - 5.11 MIL/uL   Hemoglobin 11.7 (L) 12.0 - 15.0 g/dL   HCT 72.5 36.6 - 44.0 %   MCV 96.9 80.0 - 100.0 fL   MCH 30.2 26.0 - 34.0 pg   MCHC 31.2 30.0 - 36.0 g/dL   RDW 34.7 (H) 42.5 - 95.6 %   Platelets 213 150 - 400 K/uL   nRBC 0.0 0.0 -  0.2 %    Comment: Performed at [facility], [address], [place], [place] [address]  Magnesium     Status: None   Collection Time: 12/21/22 12:40 AM  Result Value Ref Range   Magnesium 2.1 1.7 - 2.4 mg/dL    Comment: Performed at [facility], [address], [place], [place] [address]  CBG monitoring, ED     Status: Abnormal   Collection Time: 12/21/22  1:23 AM  Result Value Ref Range   Glucose-Capillary 371 (H) 70 - 99 mg/dL    Comment: Glucose reference range applies only to samples taken after fasting for at least 8 hours.  CBG monitoring, ED     Status: Abnormal   Collection Time: 12/21/22  4:24 AM  Result Value Ref Range   Glucose-Capillary 271 (H) 70 - 99 mg/dL    Comment: Glucose  reference range applies only to samples taken after fasting for at least 8 hours.  CBG monitoring, ED     Status: Abnormal   Collection Time: 12/21/22  8:00 AM  Result Value Ref Range   Glucose-Capillary 134 (H) 70 - 99 mg/dL    Comment: Glucose reference range applies only to samples taken after fasting for at least 8 hours.  CBG monitoring, ED     Status: Abnormal   Collection Time: 12/21/22 11:45 AM  Result Value Ref Range   Glucose-Capillary 217 (H) 70 - 99 mg/dL    Comment: Glucose reference range applies only to samples taken after fasting for at least 8 hours.  CBG monitoring, ED     Status: Abnormal   Collection Time: 12/21/22  3:59 PM  Result Value Ref Range   Glucose-Capillary 171 (H) 70 - 99 mg/dL    Comment: Glucose reference range applies only to samples taken after fasting for at least 8 hours.   *Note: Due to a large number of results and/or encounters for the requested time period, some results have not been displayed. A complete set of results can be found in Results Review.   DG CHEST PORT 1 VIEW  Result Date: 12/21/2022 CLINICAL DATA:  37 year old female with respiratory failure, hypoxia. EXAM: PORTABLE CHEST 1 VIEW COMPARISON:  Portable chest 03/29/2022 and earlier. FINDINGS: Portable AP semi upright view at 0613 hours. Lower lung volumes. Streaky bilateral lower lung peribronchial opacity, similar in appearance to CTA 12/27/2021. No superimposed pneumothorax, pulmonary edema, pleural effusion. X central aided cardiac size, within normal limits on previous exams. Other mediastinal contours are within normal limits. Visualized tracheal air column is within normal limits. No acute osseous abnormality identified. Paucity of bowel gas. IMPRESSION: Lower lung volumes with new streaky bilateral lower lung opacity. Top differential considerations include atelectasis and bronchopneumonia. No pleural effusion is evident. Electronically Signed   By: Odessa Fleming M.D.   On: 12/21/2022 06:44    CT ABDOMEN PELVIS WO CONTRAST  Result Date: 12/20/2022 CLINICAL DATA:  Nausea vomiting for 2 days EXAM: CT ABDOMEN AND PELVIS WITHOUT CONTRAST TECHNIQUE: Multidetector CT imaging of the abdomen and pelvis was performed following the standard protocol without IV contrast. RADIATION DOSE REDUCTION: This exam was performed according to the departmental dose-optimization program which includes automated exposure control, adjustment of the mA and/or kV according to patient size and/or use of iterative reconstruction technique. COMPARISON:  CT 12/13/2022, 12/26/2021, 08/09/2020 FINDINGS: Lower chest: Lung bases demonstrate bandlike densities most likely atelectasis. Hepatobiliary: No focal liver abnormality is seen. Status post cholecystectomy. No biliary dilatation. Pancreas: Unremarkable. No  pancreatic ductal dilatation or surrounding inflammatory changes. Spleen: Upper normal in size at 12 cm Adrenals/Urinary Tract: Adrenal glands are normal. Atrophic left kidney with scarring. Nonvisualized right kidney. Soft tissue thickening and calcifications in the right lower quadrant corresponding to history of remote failed transplanted kidney. Bladder is decompressed. Stomach/Bowel: Stomach is moderately distended with fluid. Mild circumferential thickening of the distal esophagus with some fluid in the esophagus. Hazy appearance of the distal esophagus suggesting mild inflammation. No dilated small bowel. Negative appendix. No acute bowel wall thickening Vascular/Lymphatic: Nonaneurysmal aorta.  No suspicious lymph nodes. Reproductive: Hysterectomy.  No adnexal mass. Other: Negative for pelvic effusion or free air. Musculoskeletal: No acute or suspicious osseous abnormality. IMPRESSION: 1. Mild circumferential thickening of the distal esophagus with some fluid in the esophagus and hazy appearance of the distal esophagus suggesting mild inflammation/esophagitis. 2. Moderately distended stomach with fluid. 3. Atrophic  left kidney with scarring. Nonvisualized right kidney. Soft tissue thickening and calcifications in the right lower quadrant corresponding to history of remote failed transplanted kidney. Electronically Signed   By: Jasmine Pang M.D.   On: 12/20/2022 22:02   CT Head Wo Contrast  Result Date: 12/20/2022 CLINICAL DATA:  Headache and dizziness. EXAM: CT HEAD WITHOUT CONTRAST TECHNIQUE: Contiguous axial images were obtained from the base of the skull through the vertex without intravenous contrast. RADIATION DOSE REDUCTION: This exam was performed according to the departmental dose-optimization program which includes automated exposure control, adjustment of the mA and/or kV according to patient size and/or use of iterative reconstruction technique. COMPARISON:  Head CT dated 09/25/2022. FINDINGS: Brain: The ventricles and sulci are appropriate size for the patient's age. The gray-white matter discrimination is preserved. There is no acute intracranial hemorrhage. No mass effect or midline shift. No extra-axial fluid collection. Vascular: No hyperdense vessel or unexpected calcification. Skull: Normal. Negative for fracture or focal lesion. Sinuses/Orbits: No acute finding. Other: None IMPRESSION: Unremarkable noncontrast CT of the brain. Electronically Signed   By: Elgie Collard M.D.   On: 12/20/2022 21:53   DG Abdomen 1 View  Result Date: 12/20/2022 CLINICAL DATA:  Evaluate for small bowel obstruction, abdominal pain EXAM: ABDOMEN - 1 VIEW COMPARISON:  None Available. FINDINGS: Nonobstructive pattern of bowel gas. No free air in the abdomen on a single decubitus radiograph provided for review. No large burden of stool. IMPRESSION: Nonobstructive pattern of bowel gas. No free air in the abdomen on a single decubitus radiograph provided for review. No large burden of stool. Electronically Signed   By: Jearld Lesch M.D.   On: 12/20/2022 17:56    Pending Labs Unresulted Labs (From admission, onward)      Start     Ordered   12/21/22 0500  Basic metabolic panel  Daily,   R      12/21/22 0007   12/21/22 0500  CBC  Daily,   R      12/21/22 0007   12/20/22 2052  Hepatitis B surface antibody,quantitative  (New Admission Hemo Labs (Hepatitis B))  Once,   URGENT        12/20/22 2053   Signed and Held  Renal function panel  Once,   R        Signed and Held   Signed and Held  CBC  Once,   R        Signed and Held            Vitals/Pain Today's Vitals   12/21/22 1147 12/21/22 1151 12/21/22 1232 12/21/22 1330  BP:  (!) 135/93 116/80 122/88  Pulse:  86 85 85  Resp: 16 14 13 18   Temp:   99.1 F (37.3 C)   TempSrc:   Oral   SpO2:  92% 98% 100%  Weight:      Height:      PainSc:        Isolation Precautions No active isolations  Medications Medications  metoprolol tartrate (LOPRESSOR) tablet 25 mg (25 mg Oral Given 12/21/22 0912)  hydrOXYzine (ATARAX) tablet 25-50 mg (has no administration in time range)  pantoprazole (PROTONIX) EC tablet 40 mg (40 mg Oral Given 12/21/22 0912)  sucralfate (CARAFATE) tablet 1 g (1 g Oral Given 12/21/22 0807)  insulin glargine-yfgn (SEMGLEE) injection 5 Units (5 Units Subcutaneous Given 12/21/22 0133)  insulin aspart (novoLOG) injection 0-6 Units (1 Units Subcutaneous Given 12/21/22 1606)  heparin injection 5,000 Units (5,000 Units Subcutaneous Given 12/21/22 1415)  sodium chloride flush (NS) 0.9 % injection 3 mL (3 mLs Intravenous Given 12/21/22 1006)  acetaminophen (TYLENOL) tablet 650 mg (has no administration in time range)    Or  acetaminophen (TYLENOL) suppository 650 mg (has no administration in time range)  HYDROmorphone (DILAUDID) injection 0.5 mg (0.5 mg Intravenous Given 12/21/22 0048)  prochlorperazine (COMPAZINE) injection 5 mg (has no administration in time range)  hydrALAZINE (APRESOLINE) tablet 50 mg (has no administration in time range)  diazepam (VALIUM) injection 2.5 mg (2.5 mg Intravenous Given 12/20/22 1508)  prochlorperazine  (COMPAZINE) injection 10 mg (10 mg Intravenous Given 12/20/22 1507)  HYDROmorphone (DILAUDID) injection 1 mg (1 mg Intravenous Given 12/20/22 1508)  hydrALAZINE (APRESOLINE) injection 10 mg (10 mg Intravenous Given 12/20/22 1619)  labetalol (NORMODYNE) injection 20 mg (20 mg Intravenous Given 12/20/22 1723)  HYDROmorphone (DILAUDID) injection 1 mg (1 mg Intravenous Given 12/20/22 1723)  hydrALAZINE (APRESOLINE) injection 20 mg (20 mg Intravenous Given 12/20/22 1957)  prochlorperazine (COMPAZINE) injection 10 mg (10 mg Intravenous Given 12/20/22 1957)  morphine (PF) 4 MG/ML injection 6 mg (6 mg Intravenous Given 12/20/22 2041)  sucralfate (CARAFATE) 1 GM/10ML suspension 1 g (1 g Oral Given 12/21/22 0035)  lidocaine (XYLOCAINE) 2 % viscous mouth solution 10 mL (10 mLs Mouth/Throat Given 12/21/22 0034)  pantoprazole (PROTONIX) injection 40 mg (40 mg Intravenous Given 12/21/22 0035)    Mobility walks     Focused Assessments    R Recommendations: See Admitting Provider Note  Report given to:   Additional Notes:

## 2022-12-21 NOTE — Plan of Care (Signed)
  Problem: Education: Goal: Knowledge of the prescribed therapeutic regimen will improve Outcome: Progressing   Problem: Activity: Goal: Ability to perform//tolerate increased activity and mobilize with assistive devices will improve Outcome: Progressing   Problem: Self-Care: Goal: Ability to meet self-care needs will improve Outcome: Progressing   Problem: Pain Management: Goal: Pain level will decrease with appropriate interventions Outcome: Progressing   Problem: Skin Integrity: Goal: Demonstration of wound healing without infection will improve Outcome: Progressing   Problem: Coping: Goal: Psychosocial and spiritual needs will be supported Outcome: Progressing   Problem: Respiratory: Goal: Will maintain a patent airway Outcome: Progressing Goal: Complications related to the disease process, condition or treatment will be avoided or minimized Outcome: Progressing   Problem: Coping: Goal: Ability to adjust to condition or change in health will improve Outcome: Progressing   Problem: Fluid Volume: Goal: Ability to maintain a balanced intake and output will improve Outcome: Progressing

## 2022-12-21 NOTE — ED Notes (Signed)
Pt placed on 2 liters [place] due to pt desat while sleeping. MD was notified.

## 2022-12-21 NOTE — Hospital Course (Signed)
37 y.o. female with medical history significant for type 1 diabetes mellitus, hypertension, ESRD on hemodialysis, failed renal transplant, esophagitis, and esophageal stricture who presents with abdominal pain, nausea, and vomiting.    Patient developed pain across the upper abdomen with nausea and vomiting the night of 12/18/2022.  She was seen in the emergency department the following morning, improved with symptomatic treatments and went home, but soon developed recurrent symptoms.  She has persistent pain across the upper abdomen that is worse with any attempted oral intake, and continues to experience nausea and nonbloody vomiting.     Patient reports history of recurrent upper abdominal pain with nausea and vomiting. She is followed by GI at [facility] for this and underwent EGD on 11/29/2022 dilation of a benign-appearing esophageal stricture. Food residue was also noted in the stomach at that time and she is due to see GI at [facility] again on 12/23/2022

## 2022-12-21 NOTE — Consult Note (Signed)
Renal Service Consult Note [facility]  [name] 12/21/2022 Valerie Krabbe, MD Requesting Physician: Dr. Rhona Leavens  Reason for Consult: ESRD pt w/ N/V and abd pain HPI: The patient is a 37 y.o. year-old w/ PMH as below who presented to ED for n/v and abd pain. In ED BP was 230/115. Pt was admitted and started on pain meds, IV valium, carafate and IV bp lowering medications. Pt was admitted. We are asked to see for dialysis.   Pt seen in room. No c/o's today. Feeling better. Abd pain better. No HD issues.   ROS - denies CP, no joint pain, no HA, no blurry vision, no rash, no dysuria, no difficulty voiding   Past Medical History  Past Medical History:  Diagnosis Date   Anemia    Blood transfusion without reported diagnosis    Cellulitis of left leg 03/01/2018   Chronic kidney disease    kidney transplant 07   Diabetes mellitus    Pt reports diagnosis in June 2011, Type 2   Diabetes mellitus without complication (HCC)    Esophageal obstruction due to food impaction    GERD (gastroesophageal reflux disease)    Hyperlipidemia    Hypertension    Intra-abdominal abscess (HCC) 10/28/2018   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   LEARNING DISABILITY 09/25/2007   Qualifier: Diagnosis of  By: Dayton Martes MD, Talia     Nausea and vomiting    Prolonged Q-T interval on ECG    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   Pyelonephritis 06/23/2014   Renal and perinephric abscess 11/01/2018   Renal disorder    Seasonal allergies    Seizures (HCC)    UTI (urinary tract infection) 01/09/2015   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   Qualifier: Diagnosis of  By: Sandi Mealy  MD, Judeth Cornfield     Past Surgical History  Past Surgical History:  Procedure Laterality Date   AMPUTATION Left 04/02/2022   Procedure: AMPUTATION LEFT GREAT TOE;  Surgeon: Nadara Mustard, MD;  Location: [facility] OR;  Service: Orthopedics;  Laterality: Left;   ARTERIOVENOUS GRAFT PLACEMENT Bilateral    "neither work" (10/24/2017)   AV  FISTULA PLACEMENT Left 10/26/2018   Procedure: CREATION OF ARTERIOVENOUS FISTULA  LEFT ARM;  Surgeon: Cephus Shelling, MD;  Location: [facility] OR;  Service: Vascular;  Laterality: Left;   AV FISTULA PLACEMENT Left 05/23/2020   Procedure: LEFT ARM ARTERIOVENOUS (AV) FISTULA CREATION;  Surgeon: Cephus Shelling, MD;  Location: [facility] OR;  Service: Vascular;  Laterality: Left;   BALLOON DILATION N/A 01/19/2021   Procedure: Marvis Repress DILATION;  Surgeon: Iva Boop, MD;  Location: [facility] ENDOSCOPY;  Service: Endoscopy;  Laterality: N/A;   BALLOON DILATION N/A 02/10/2021   Procedure: BALLOON DILATION;  Surgeon: Hilarie Fredrickson, MD;  Location: [facility] ENDOSCOPY;  Service: Endoscopy;  Laterality: N/A;   BALLOON DILATION N/A 02/19/2021   Procedure: BALLOON DILATION;  Surgeon: Imogene Burn, MD;  Location: [facility]COPY;  Service: Gastroenterology;  Laterality: N/A;   BASCILIC VEIN TRANSPOSITION Left 12/21/2018   Procedure: Left arm BASILIC VEIN TRANSPOSITION SECOND STAGE;  Surgeon: Cephus Shelling, MD;  Location: [facility] OR;  Service: Vascular;  Laterality: Left;   BIOPSY  12/29/2021   Procedure: BIOPSY;  Surgeon: Meryl Dare, MD;  Location: [facility] ENDOSCOPY;  Service: Gastroenterology;;   CHOLECYSTECTOMY N/A 06/30/2017   Procedure: LAPAROSCOPIC CHOLECYSTECTOMY WITH INTRAOPERATIVE CHOLANGIOGRAM;  Surgeon: Glenna Fellows, MD;  Location: [facility] ORS;  Service: General;  Laterality: N/A;   ESOPHAGOGASTRODUODENOSCOPY (EGD) WITH  PROPOFOL N/A 07/04/2017   Procedure: ESOPHAGOGASTRODUODENOSCOPY (EGD) WITH PROPOFOL;  Surgeon: Vida Rigger, MD;  Location: [facility];  Service: Endoscopy;  Laterality: N/A;   ESOPHAGOGASTRODUODENOSCOPY (EGD) WITH PROPOFOL N/A 08/10/2020   Procedure: ESOPHAGOGASTRODUODENOSCOPY (EGD) WITH PROPOFOL;  Surgeon: Sherrilyn Rist, MD;  Location: [facility] ENDOSCOPY;  Service: Gastroenterology;  Laterality: N/A;   ESOPHAGOGASTRODUODENOSCOPY (EGD) WITH PROPOFOL N/A 01/19/2021   Procedure:  ESOPHAGOGASTRODUODENOSCOPY (EGD) WITH PROPOFOL;  Surgeon: Iva Boop, MD;  Location: [facility];  Service: Endoscopy;  Laterality: N/A;  WITH FLUOROSCOPY AND DILATION   ESOPHAGOGASTRODUODENOSCOPY (EGD) WITH PROPOFOL N/A 02/03/2021   Procedure: ESOPHAGOGASTRODUODENOSCOPY (EGD) WITH PROPOFOL;  Surgeon: Iva Boop, MD;  Location: [facility];  Service: Endoscopy;  Laterality: N/A;   ESOPHAGOGASTRODUODENOSCOPY (EGD) WITH PROPOFOL N/A 02/10/2021   Procedure: ESOPHAGOGASTRODUODENOSCOPY (EGD) WITH PROPOFOL;  Surgeon: Hilarie Fredrickson, MD;  Location: [facility];  Service: Endoscopy;  Laterality: N/A;  Balloon Dilation   ESOPHAGOGASTRODUODENOSCOPY (EGD) WITH PROPOFOL N/A 02/19/2021   Procedure: ESOPHAGOGASTRODUODENOSCOPY (EGD) WITH PROPOFOL;  Surgeon: Imogene Burn, MD;  Location: [facility];  Service: Gastroenterology;  Laterality: N/A;   ESOPHAGOGASTRODUODENOSCOPY (EGD) WITH PROPOFOL N/A 03/12/2021   Procedure: ESOPHAGOGASTRODUODENOSCOPY (EGD) WITH PROPOFOL;  Surgeon: Shellia Cleverly, DO;  Location: [facility];  Service: Gastroenterology;  Laterality: N/A;   ESOPHAGOGASTRODUODENOSCOPY (EGD) WITH PROPOFOL N/A 12/29/2021   Procedure: ESOPHAGOGASTRODUODENOSCOPY (EGD) WITH PROPOFOL;  Surgeon: Meryl Dare, MD;  Location: [facility] ENDOSCOPY;  Service: Gastroenterology;  Laterality: N/A;   FLEXIBLE SIGMOIDOSCOPY N/A 12/29/2021   Procedure: FLEXIBLE SIGMOIDOSCOPY;  Surgeon: Meryl Dare, MD;  Location: [facility] ENDOSCOPY;  Service: Gastroenterology;  Laterality: N/A;   FOREIGN BODY REMOVAL N/A 02/03/2021   Procedure: FOREIGN BODY REMOVAL;  Surgeon: Iva Boop, MD;  Location: [facility]OS[facility];  Service: Endoscopy;  Laterality: N/A;   INSERTION OF DIALYSIS CATHETER N/A 03/20/2018   Procedure: INSERTION OF TUNNELED DIALYSIS CATHETER - RIGHT INTERANL JUGULAR PLACEMENT;  Surgeon: Chuck Hint, MD;  Location: [facility] OR;  Service: Vascular;  Laterality: N/A;   IR FLUORO GUIDE CV LINE RIGHT  04/18/2020    IR GUIDED DRAIN W CATHETER PLACEMENT  10/28/2018   KIDNEY TRANSPLANT  2007   KIDNEY TRANSPLANT Right    PARATHYROIDECTOMY  ?2012   "3/4 removed" (10/24/2017)   RENAL BIOPSY Bilateral 2003   REVISON OF ARTERIOVENOUS FISTULA Left 09/24/2020   Procedure: LEFT UPPER ARM ARTERIOVENOUS GRAFT CREATION;  Surgeon: Cephus Shelling, MD;  Location: [facility] OR;  Service: Vascular;  Laterality: Left;   UPPER EXTREMITY VENOGRAPHY Bilateral 10/19/2018   Procedure: UPPER EXTREMITY VENOGRAPHY;  Surgeon: Cephus Shelling, MD;  Location: [facility] INVASIVE CV LAB;  Service: Cardiovascular;  Laterality: Bilateral;  Bilateral    UPPER EXTREMITY VENOGRAPHY Left 09/04/2020   Procedure: UPPER EXTREMITY VENOGRAPHY - Left Upper;  Surgeon: Cephus Shelling, MD;  Location: [facility] INVASIVE CV LAB;  Service: Cardiovascular;  Laterality: Left;   Family History  Family History  Problem Relation Age of Onset   Arthritis Mother    Hypertension Mother    Aneurysm Mother        died of brain aneurysm   CAD Father        Has 3 stents   Diabetes Father        borderline   Early death Brother        Died in war   Colon cancer Neg Hx    Esophageal cancer Neg Hx    Rectal cancer Neg Hx    Stomach cancer Neg  Hx    Social History  reports that she has never smoked. She has never been exposed to tobacco smoke. She has never used smokeless tobacco. She reports that she does not currently use alcohol. She reports that she does not use drugs. Allergies  Allergies  Allergen Reactions   Diphenhydramine Anaphylaxis   Motrin [Ibuprofen] Shortness Of Breath and Itching   Peanut-Containing Drug Products Itching and Hives    Able to tolerate when cooked in foods   Contrast Media [Iodinated Contrast Media] Itching   Shellfish Allergy Hives   Banana Itching, Nausea And Vomiting and Other (See Comments)    Sick on the stomach   Chlorhexidine Itching   Ferrous Sulfate Itching   Iron Dextran Itching and Other (See Comments)    Vein  irritation    Home medications Prior to Admission medications   Medication Sig Start Date End Date Taking? Authorizing Provider  acetaminophen (TYLENOL) 500 MG tablet Take 500-1,000 mg by mouth every 6 (six) hours as needed for moderate pain.   Yes [provider]  Calcium Carbonate Antacid (CALCIUM CARBONATE, DOSED IN MG ELEMENTAL CALCIUM,) 1250 MG/5ML SUSP Take 5 mLs by mouth daily. 10/22/20  Yes [provider]  dicyclomine (BENTYL) 20 MG tablet Take 1 tablet (20 mg total) by mouth 2 (two) times daily for 5 days. 12/13/22 12/21/22 Yes Schutt, Edsel Petrin, PA-C  fluticasone (FLONASE) 50 MCG/ACT nasal spray Place 2 sprays into both nostrils daily as needed for allergies. 09/03/18  Yes Glade Lloyd, MD  gabapentin (NEURONTIN) 600 MG tablet Take 600 mg by mouth 3 (three) times daily. 12/30/20  Yes [provider]  HYDROcodone-acetaminophen (NORCO) 5-325 MG tablet Take 1 tablet by mouth every 6 (six) hours as needed. 04/09/22  Yes Adonis Huguenin, NP  hydrOXYzine (ATARAX) 25 MG tablet Take 25-50 mg by mouth 2 (two) times daily as needed. 11/24/22  Yes [provider]  insulin aspart (NOVOLOG FLEXPEN) 100 UNIT/ML FlexPen Inject 0-15 Units into the skin as needed for high blood sugar. Sliding Scale insulin   Yes [provider]  insulin degludec (TRESIBA) 100 UNIT/ML FlexTouch Pen Inject 5-10 Units into the skin daily. Patient taking differently: Inject 7 Units into the skin daily. 01/01/22  Yes Lurene Shadow, MD  lidocaine-prilocaine (EMLA) cream Apply 1 application topically daily as needed [facility]). 12/17/20  Yes [provider]  loperamide (IMODIUM) 2 MG capsule Take 2 mg by mouth as needed for diarrhea or loose stools. 09/11/20  Yes [provider]  metoprolol tartrate (LOPRESSOR) 25 MG tablet Take 1 tablet (25 mg total) by mouth daily as needed (for systolic blood pressure or 135 or above). Patient taking differently: Take 25 mg by mouth  daily. 01/01/22  Yes Lurene Shadow, MD  omeprazole (PRILOSEC) 40 MG capsule Take 1 capsule (40 mg total) by mouth 2 (two) times daily before a meal. Open capsule and place granules in water or applesauce to swallow 03/17/21  Yes Iva Boop, MD  ondansetron (ZOFRAN-ODT) 4 MG disintegrating tablet Take 1 tablet (4 mg total) by mouth every 8 (eight) hours as needed for nausea or vomiting. 12/13/22  Yes Schutt, Edsel Petrin, PA-C  oxyCODONE-acetaminophen (PERCOCET/ROXICET) 5-325 MG tablet Take 1 tablet by mouth daily as needed for severe pain. 01/01/22  Yes Lurene Shadow, MD  promethazine (PHENERGAN) 25 MG tablet Take 1 tablet (25 mg total) by mouth every 6 (six) hours as needed for nausea or vomiting. 12/19/22  Yes Sabas Sous, MD  simvastatin (ZOCOR)  20 MG tablet Take 20 mg by mouth every 3 (three) days. Monday, Wednesday, Friday   Yes [provider]  tiZANidine (ZANAFLEX) 2 MG tablet Take 2 mg by mouth 2 (two) times daily as needed. 10/28/22  Yes [provider]  cetirizine (ZYRTEC) 10 MG tablet Take 10 mg by mouth daily as needed for allergies. Patient not taking: Reported on 12/21/2022    [provider]  cyclobenzaprine (FLEXERIL) 5 MG tablet Take 5 mg by mouth daily as needed for muscle spasms. Patient not taking: Reported on 12/21/2022 09/30/22   [provider]  doxycycline (VIBRA-TABS) 100 MG tablet Take 1 tablet (100 mg total) by mouth 2 (two) times daily. Patient not taking: Reported on 12/21/2022 04/29/22   Nadara Mustard, MD  doxycycline (VIBRAMYCIN) 100 MG capsule Take 1 capsule (100 mg total) by mouth 2 (two) times daily. Patient not taking: Reported on 12/21/2022 04/09/22   Adonis Huguenin, NP  famotidine (PEPCID) 40 MG tablet Take 1 tablet (40 mg total) by mouth 2 (two) times daily as needed for heartburn or indigestion. Patient not taking: Reported on 12/21/2022 01/01/22   Lurene Shadow, MD  meclizine (ANTIVERT) 25 MG tablet Take 1 tablet (25 mg  total) by mouth 3 (three) times daily as needed for dizziness. Patient not taking: Reported on 12/21/2022 06/10/22   Mesner, Barbara Cower, MD  midodrine (PROAMATINE) 5 MG tablet Take 5 mg by mouth. Patient not taking: Reported on 12/21/2022 11/25/22   [provider]  predniSONE (DELTASONE) 20 MG tablet Take 60 mg by mouth daily. Patient not taking: Reported on 12/21/2022 10/28/22   [provider]  RENVELA 2.4 g PACK Take 2.4 g by mouth 3 (three) times daily. 05/04/21   [provider]  sucralfate (CARAFATE) 1 g tablet Take 1 g by mouth 2 (two) times daily. Take 1 tablet (1 g total) by mouth 2 (two) times daily as needed (indigestion) Works best if taken dissolved in two teaspoons water and taken as a slurry, but if not tolerable can take tablet whole Patient not taking: Reported on 12/21/2022 11/11/22   [provider]     Vitals:   12/21/22 1147 12/21/22 1151 12/21/22 1232 12/21/22 1330  BP:  (!) 135/93 116/80 122/88  Pulse:  86 85 85  Resp: 16 14 13 18   Temp:   99.1 F (37.3 C)   TempSrc:   Oral   SpO2:  92% 98% 100%  Weight:      Height:       Exam Gen alert, no distress No rash, cyanosis or gangrene Sclera anicteric, throat clear  No jvd or bruits Chest clear bilat to bases, no rales/ wheezing RRR no MRG Abd soft ntnd no mass or ascites +bs GU defer MS no joint effusions or deformity Ext no LE or UE edema, no wounds or ulcers Neuro is alert, Ox 3 , nf    LUA AVG+bruit     Renal-related home meds: - gabapentin 600 tid - midodrine 5mg   - renvela 3 ac tid - metoprolol 25 bid - carafate 1 gm bid    OP HD: TTS SW  4h  300/500   83kg  LUA AVG  Heparin 4000+ 2500 midrun - last OP HD 10/05, post wt 83kg - mircera 60 mcg q 2, due 10/15 - hectorol 2 mcg   Assessment/ Plan: Abd pain/ N/V - intractable. W/u in progress per pmd.  HTN'sive urgency - initial BP in ED 233/ 138. Much better today  w/ pain meds and metoprolol.  ESRD - on HD TTS. Has not  missed HD. HD today.  Volume - euvolemic on exam. Needs wt pre HD.  Anemia esrd - Hb 11-12 here. Next esa due 10/15. No esa needs at this time.  MBD ckd - CCa in range, add on phos. Cont IV vdra and po binders when eating.  IDDM      Valerie Moselle  MD [facility] 12/21/2022, 2:49 PM  Recent Labs  Lab 12/19/22 0424 12/20/22 1514 12/21/22 0040  HGB 11.8* 13.4 11.7*  ALBUMIN 4.6 4.5  --   CALCIUM 10.0 8.9 8.0*  CREATININE 6.51* 9.61* 11.12*  K 4.0 4.3 4.3   Inpatient medications:  heparin  5,000 Units Subcutaneous Q8H   insulin aspart  0-6 Units Subcutaneous Q4H   insulin glargine-yfgn  5 Units Subcutaneous QHS   metoprolol tartrate  25 mg Oral BID   pantoprazole  40 mg Oral BID   sodium chloride flush  3 mL Intravenous Q12H   sucralfate  1 g Oral BID AC    acetaminophen **OR** acetaminophen, hydrALAZINE, HYDROmorphone (DILAUDID) injection, hydrOXYzine, prochlorperazine

## 2022-12-21 NOTE — Progress Notes (Signed)
Progress Note   Patient: [name] BJY:[id number] DOB: [birth year] DOA: 12/20/2022     0 DOS: the patient was seen and examined on 12/21/2022   Brief hospital course: 37 y.o. female with medical history significant for type 1 diabetes mellitus, hypertension, ESRD on hemodialysis, failed renal transplant, esophagitis, and esophageal stricture who presents with abdominal pain, nausea, and vomiting.    Patient developed pain across the upper abdomen with nausea and vomiting the night of 12/18/2022.  She was seen in the emergency department the following morning, improved with symptomatic treatments and went home, but soon developed recurrent symptoms.  She has persistent pain across the upper abdomen that is worse with any attempted oral intake, and continues to experience nausea and nonbloody vomiting.     Patient reports history of recurrent upper abdominal pain with nausea and vomiting. She is followed by GI at [facility] for this and underwent EGD on 11/29/2022 dilation of a benign-appearing esophageal stricture. Food residue was also noted in the stomach at that time and she is due to see GI at [facility] again on 12/23/2022   Assessment and Plan: 1. Intractable abdominal pain and N/V  - Pt describes sensation of pills becoming "stuck" at times - Pt is followed by [facility] for the same. Pt is scheduled for outpt EGD 10/10 - Consulted SLP. Will attempt to advance as tolerated from clears. Goal is likely to remain on a liquid diet until pt sees GI in 2 days   2. Hypertensive urgency  - No target organ damage   - Continue metoprolol, hold midodrine, use hydralazine as needed   -BP improved   3. ESRD  - Nephrology planning to dialyze here - Renally-dose medications, restrict fluids    4. Insulin-dependent DM  - A1c was 9.1% in January 2024  - Check CBGs and treat with 5 units long-acting insulin daily and low-intensity SSI    Subjective: Very tired and drowsy this AM. Reported sensation of some pills  getting "stuck" at times  Physical Exam: Vitals:   12/21/22 1147 12/21/22 1151 12/21/22 1232 12/21/22 1330  BP:  (!) 135/93 116/80 122/88  Pulse:  86 85 85  Resp: 16 14 13 18   Temp:   99.1 F (37.3 C)   TempSrc:   Oral   SpO2:  92% 98% 100%  Weight:      Height:       General exam: Awake, laying in bed, in nad Respiratory system: Normal respiratory effort, no wheezing Cardiovascular system: regular rate, s1, s2 Gastrointestinal system: Soft, nondistended, positive BS Central nervous system: CN2-12 grossly intact, strength intact Extremities: Perfused, no clubbing Skin: Normal skin turgor, no notable skin lesions seen Psychiatry: Mood normal // no visual hallucinations   Data Reviewed:  Labs reviewed: Na 135, K 4.3, Cr 11.12, WBC 11.5   Family Communication: Pt in room, family not at bedside  Disposition: Status is: Observation The patient remains OBS appropriate and will d/c before 2 midnights.  Planned Discharge Destination: Home     Author: Rickey Barbara, MD 12/21/2022 5:29 PM  For on call review [email or web address].

## 2022-12-21 NOTE — Inpatient Diabetes Management (Signed)
Inpatient Diabetes Program Recommendations  AACE/ADA: New Consensus Statement on Inpatient Glycemic Control (2015)  Target Ranges:  Prepandial:   less than 140 mg/dL      Peak postprandial:   less than 180 mg/dL (1-2 hours)      Critically ill patients:  140 - 180 mg/dL   Lab Results  Component Value Date   GLUCAP 171 (H) 12/21/2022   HGBA1C 9.9 (H) 12/21/2022    Review of Glycemic Control  Diabetes history: DM1 Outpatient Diabetes medications: Tresiba 6-7 units daily, Novolog 2-3 units TID Current orders for Inpatient glycemic control: Semglee 5 units at bedtime, Novolog 0-6 units Q4  HgbA1C - 9.9%  Inpatient Diabetes Program Recommendations:    Agree with orders.  When diet is advanced, consider adding Novolog 2 units TID with meals if eating > 50%.  Spoke with pt at bedside late this afternoon in [facility]. Pt states she has not been making diabetes control important. Has seen Endo in the past, but states "he was rough on me and I didn't go back." PCP is managing her diabetes, although pt would consider seeing another Endo. States she may be interested in getting an insulin pump one day. Gives injections in abdomen, rotating sites. xplained how hyperglycemia leads to damage within blood vessels which lead to the common complications seen with uncontrolled diabetes. Stressed to the patient the importance of improving glycemic control to prevent further complications from uncontrolled diabetes. Discussed impact of nutrition, exercise, stress, sickness, and medications on diabetes control. Discussed hypoglycemia s/s and treatment. Answered all questions.   Continue to follow.  Thank you. Ailene Ards, RD, LDN, CDCES Inpatient Diabetes Coordinator [phone]

## 2022-12-21 NOTE — Progress Notes (Signed)
12/21/22 1500  SLP Visit Information  SLP Received On 12/21/22  Subjective  Subjective pt awake in bed  Patient/Family Stated Goal to be able to get to GI MD - they only dilated me 2 mm last time due to food retention in esophagus in esophagus  General Information  HPI 37 yo female adm to [facility] with N/V including hemoptysis. Pt PMH + for extensive GI issues including esophageal narrowings s/p multiple dilatations.  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  Swallow eval ordered as pt reported difficulties swallowing pills.  Type of Study Bedside Swallow Evaluation  Previous Swallow Assessment endoscopy dilatations  Diet Prior to this Study NPO  Temperature Spikes Noted No  Respiratory Status Nasal cannula  History of Recent Intubation No  Behavior/Cognition Alert;Cooperative;Pleasant mood  Oral Cavity Assessment WFL  Oral Care Completed by SLP No  Oral Cavity - Dentition Adequate natural dentition  Vision Functional for self-feeding  Self-Feeding Abilities Able to feed self  Patient Positioning Upright in bed  Baseline Vocal Quality Normal  Volitional Cough Strong  Volitional Swallow Able to elicit  Pain Assessment  Pain Assessment Faces  Faces Pain Scale 4  Pain Location chest pain - from reflux and swallowing pills per pt  Pain Descriptors / Indicators Burning;Aching  Oral Assessment (Complete on admission/transfer/every shift)  Oral Assessment  (WDL) WDL  Level of Consciousness Alert  Is patient on any of following O2 devices? None of the above  Nutritional status Fluids only or NPO for >24 hours  Oral Assessment Risk  Low Risk  Oral Motor/Sensory Function  Overall Oral Motor/Sensory Function WFL  Ice Chips  Ice chips NT  Thin Liquid  Thin Liquid WFL  Presentation Cup;Self Fed  Nectar Thick Liquid  Nectar Thick Liquid NT  Honey Thick Liquid  Honey Thick Liquid NT  Puree   Puree WFL  Presentation Spoon;Self Fed  Other Comments pt took very small bolus  Solid  Solid NT  Other Comments DNT due to pt report of sensing pill lodging in left side of her pharynx -  SLP - End of Session  Patient left in bed;with call bell/phone within reach  SLP Assessment  Clinical Impression Statement (ACUTE ONLY) Patient presents with clinical indications of known esophageal deficits.  No focal CN deficits and no indication of pharyngeal deficits. Suspect her sensation of pill lodging in the right lower pharynx  -is referrant from distal esophagus.  Reviewed esophageal precautions with her using teach back.  SLP Visit Diagnosis Dysphagia, unspecified (R13.10)  Impact on safety and function Mild aspiration risk  Other Related Risk Factors History of pneumonia;History of dysphagia  Swallow Evaluation Recommendations  SLP Diet Recommendations Thin liquid (clears and advance as able)  Liquid Administration via Cup;Straw  Medication Administration  (WARM LIQUIDS or suspension)  Supervision Patient able to self feed  Compensations Slow rate;Small sips/bites  Postural Changes Seated upright at 90 degrees;Remain upright for at least 30 minutes after po intake  Treatment Plan  Oral Care Recommendations Oral care BID  Treatment Recommendations Therapy as outlined in treatment plan below  Follow Up Recommendations No SLP follow up  Individuals Consulted  Consulted and Agree with Results and Recommendations Patient  Report Sent to  Referring physician  SLP Time Calculation  SLP Start Time (ACUTE ONLY) 1440  SLP Stop Time (ACUTE ONLY) 1515  SLP Time Calculation (min) (ACUTE ONLY) 35 min  SLP Evaluations  $  SLP Speech Visit 1 Visit  SLP Evaluations  $BSS Swallow 1 Procedure   Rolena Infante, MS [facility] SLP Acute Rehab Services Office [phone]

## 2022-12-21 NOTE — H&P (Signed)
History and Physical    [name] ION:[id number] DOB: [birth year] DOA: 12/20/2022  PCP: Fleet Contras, MD   Patient coming from: Home   Chief Complaint: Abdominal pain, N/V   HPI: [name] is a 37 y.o. female with medical history significant for type 1 diabetes mellitus, hypertension, ESRD on hemodialysis, failed renal transplant, esophagitis, and esophageal stricture who presents with abdominal pain, nausea, and vomiting.   Patient developed pain across the upper abdomen with nausea and vomiting the night of 12/18/2022.  She was seen in the emergency department the following morning, improved with symptomatic treatments and went home, but soon developed recurrent symptoms.  She has persistent pain across the upper abdomen that is worse with any attempted oral intake, and continues to experience nausea and nonbloody vomiting.    Patient reports history of recurrent upper abdominal pain with nausea and vomiting as she is experiencing now.  She is followed by GI at [facility] for this and underwent EGD on 11/29/2022 dilation of a benign-appearing esophageal stricture.  Food residue was also noted in the stomach at that time and she is due to see GI at [facility] again on 12/23/2022.  ED Course: Upon arrival to the ED, patient is found to be afebrile and saturating mid 90s on room air with severely elevated blood pressure.  EKG demonstrates sinus tachycardia with rate 100 and QTc 503.  Head CT is negative for acute findings.  CT of the abdomen pelvis demonstrates mild esophagitis and moderately distended stomach with fluid.  Labs are most notable for glucose 3 and 29, normal potassium, normal serum bicarbonate, normal WBC, and BUN 33.  Nephrology was consulted by the ED physician and the patient was treated with 2 doses of IV hydralazine, IV labetalol, Valium, Dilaudid, morphine, IV Protonix, Carafate, and 2 doses of Compazine in the ED.  Review of Systems:  All other systems reviewed and apart from  HPI, are negative.  Past Medical History:  Diagnosis Date   Anemia    Blood transfusion without reported diagnosis    Cellulitis of left leg 03/01/2018   Chronic kidney disease    kidney transplant 07   Diabetes mellitus    Pt reports diagnosis in June 2011, Type 2   Diabetes mellitus without complication (HCC)    Esophageal obstruction due to food impaction    GERD (gastroesophageal reflux disease)    Hyperlipidemia    Hypertension    Intra-abdominal abscess (HCC) 10/28/2018   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   LEARNING DISABILITY 09/25/2007   Qualifier: Diagnosis of  By: Dayton Martes MD, Talia     Nausea and vomiting    Prolonged Q-T interval on ECG    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   Pyelonephritis 06/23/2014   Renal and perinephric abscess 11/01/2018   Renal disorder    Seasonal allergies    Seizures (HCC)    UTI (urinary tract infection) 01/09/2015   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   Qualifier: Diagnosis of  By: Sandi Mealy  MD, Judeth Cornfield      Past Surgical History:  Procedure Laterality Date   AMPUTATION Left 04/02/2022   Procedure: AMPUTATION LEFT GREAT TOE;  Surgeon: Nadara Mustard, MD;  Location: [facility] OR;  Service: Orthopedics;  Laterality: Left;   ARTERIOVENOUS GRAFT PLACEMENT Bilateral    "neither work" (10/24/2017)   AV FISTULA PLACEMENT Left 10/26/2018   Procedure: CREATION OF ARTERIOVENOUS FISTULA  LEFT ARM;  Surgeon: Cephus Shelling, MD;  Location: [facility] OR;  Service: Vascular;  Laterality:  Left;   AV FISTULA PLACEMENT Left 05/23/2020   Procedure: LEFT ARM ARTERIOVENOUS (AV) FISTULA CREATION;  Surgeon: Cephus Shelling, MD;  Location: [facility] OR;  Service: Vascular;  Laterality: Left;   BALLOON DILATION N/A 01/19/2021   Procedure: BALLOON DILATION;  Surgeon: Iva Boop, MD;  Location: [facility] ENDOSCOPY;  Service: Endoscopy;  Laterality: N/A;   BALLOON DILATION N/A 02/10/2021   Procedure: BALLOON DILATION;  Surgeon: Hilarie Fredrickson, MD;  Location: [facility] ENDOSCOPY;  Service:  Endoscopy;  Laterality: N/A;   BALLOON DILATION N/A 02/19/2021   Procedure: BALLOON DILATION;  Surgeon: Imogene Burn, MD;  Location: [facility] ENDOSCOPY;  Service: Gastroenterology;  Laterality: N/A;   BASCILIC VEIN TRANSPOSITION Left 12/21/2018   Procedure: Left arm BASILIC VEIN TRANSPOSITION SECOND STAGE;  Surgeon: Cephus Shelling, MD;  Location: [facility] OR;  Service: Vascular;  Laterality: Left;   BIOPSY  12/29/2021   Procedure: BIOPSY;  Surgeon: Meryl Dare, MD;  Location: [facility] ENDOSCOPY;  Service: Gastroenterology;;   CHOLECYSTECTOMY N/A 06/30/2017   Procedure: LAPAROSCOPIC CHOLECYSTECTOMY WITH INTRAOPERATIVE CHOLANGIOGRAM;  Surgeon: Glenna Fellows, MD;  Location: [facility] ORS;  Service: General;  Laterality: N/A;   ESOPHAGOGASTRODUODENOSCOPY (EGD) WITH PROPOFOL N/A 07/04/2017   Procedure: ESOPHAGOGASTRODUODENOSCOPY (EGD) WITH PROPOFOL;  Surgeon: Vida Rigger, MD;  Location: [facility] ENDOSCOPY;  Service: Endoscopy;  Laterality: N/A;   ESOPHAGOGASTRODUODENOSCOPY (EGD) WITH PROPOFOL N/A 08/10/2020   Procedure: ESOPHAGOGASTRODUODENOSCOPY (EGD) WITH PROPOFOL;  Surgeon: Sherrilyn Rist, MD;  Location: [facility] ENDOSCOPY;  Service: Gastroenterology;  Laterality: N/A;   ESOPHAGOGASTRODUODENOSCOPY (EGD) WITH PROPOFOL N/A 01/19/2021   Procedure: ESOPHAGOGASTRODUODENOSCOPY (EGD) WITH PROPOFOL;  Surgeon: Iva Boop, MD;  Location: [facility] ENDOSCOPY;  Service: Endoscopy;  Laterality: N/A;  WITH FLUOROSCOPY AND DILATION   ESOPHAGOGASTRODUODENOSCOPY (EGD) WITH PROPOFOL N/A 02/03/2021   Procedure: ESOPHAGOGASTRODUODENOSCOPY (EGD) WITH PROPOFOL;  Surgeon: Iva Boop, MD;  Location: [facility] ENDOSCOPY;  Service: Endoscopy;  Laterality: N/A;   ESOPHAGOGASTRODUODENOSCOPY (EGD) WITH PROPOFOL N/A 02/10/2021   Procedure: ESOPHAGOGASTRODUODENOSCOPY (EGD) WITH PROPOFOL;  Surgeon: Hilarie Fredrickson, MD;  Location: [facility] ENDOSCOPY;  Service: Endoscopy;  Laterality: N/A;  Balloon Dilation   ESOPHAGOGASTRODUODENOSCOPY (EGD) WITH PROPOFOL N/A  02/19/2021   Procedure: ESOPHAGOGASTRODUODENOSCOPY (EGD) WITH PROPOFOL;  Surgeon: Imogene Burn, MD;  Location: [facility] ENDOSCOPY;  Service: Gastroenterology;  Laterality: N/A;   ESOPHAGOGASTRODUODENOSCOPY (EGD) WITH PROPOFOL N/A 03/12/2021   Procedure: ESOPHAGOGASTRODUODENOSCOPY (EGD) WITH PROPOFOL;  Surgeon: Shellia Cleverly, DO;  Location: [facility] ENDOSCOPY;  Service: Gastroenterology;  Laterality: N/A;   ESOPHAGOGASTRODUODENOSCOPY (EGD) WITH PROPOFOL N/A 12/29/2021   Procedure: ESOPHAGOGASTRODUODENOSCOPY (EGD) WITH PROPOFOL;  Surgeon: Meryl Dare, MD;  Location: [facility] ENDOSCOPY;  Service: Gastroenterology;  Laterality: N/A;   FLEXIBLE SIGMOIDOSCOPY N/A 12/29/2021   Procedure: FLEXIBLE SIGMOIDOSCOPY;  Surgeon: Meryl Dare, MD;  Location: [facility] ENDOSCOPY;  Service: Gastroenterology;  Laterality: N/A;   FOREIGN BODY REMOVAL N/A 02/03/2021   Procedure: FOREIGN BODY REMOVAL;  Surgeon: Iva Boop, MD;  Location: [facility] ENDOSCOPY;  Service: Endoscopy;  Laterality: N/A;   INSERTION OF DIALYSIS CATHETER N/A 03/20/2018   Procedure: INSERTION OF TUNNELED DIALYSIS CATHETER - RIGHT INTERANL JUGULAR PLACEMENT;  Surgeon: Chuck Hint, MD;  Location: [facility] OR;  Service: Vascular;  Laterality: N/A;   IR FLUORO GUIDE CV LINE RIGHT  04/18/2020   IR GUIDED DRAIN W CATHETER PLACEMENT  10/28/2018   KIDNEY TRANSPLANT  2007   KIDNEY TRANSPLANT Right    PARATHYROIDECTOMY  ?2012   "3/4 removed" (10/24/2017)   RENAL BIOPSY Bilateral 2003   REVISON OF  ARTERIOVENOUS FISTULA Left 09/24/2020   Procedure: LEFT UPPER ARM ARTERIOVENOUS GRAFT CREATION;  Surgeon: Cephus Shelling, MD;  Location: [facility] OR;  Service: Vascular;  Laterality: Left;   UPPER EXTREMITY VENOGRAPHY Bilateral 10/19/2018   Procedure: UPPER EXTREMITY VENOGRAPHY;  Surgeon: Cephus Shelling, MD;  Location: [facility] INVASIVE CV LAB;  Service: Cardiovascular;  Laterality: Bilateral;  Bilateral    UPPER EXTREMITY VENOGRAPHY Left 09/04/2020   Procedure: UPPER  EXTREMITY VENOGRAPHY - Left Upper;  Surgeon: Cephus Shelling, MD;  Location: [facility] INVASIVE CV LAB;  Service: Cardiovascular;  Laterality: Left;    Social History:   reports that she has never smoked. She has never been exposed to tobacco smoke. She has never used smokeless tobacco. She reports that she does not currently use alcohol. She reports that she does not use drugs.  Allergies  Allergen Reactions   Diphenhydramine Anaphylaxis   Motrin [Ibuprofen] Shortness Of Breath and Itching   Peanut-Containing Drug Products Itching and Hives    Able to tolerate when cooked in foods   Contrast Media [Iodinated Contrast Media] Itching   Shellfish Allergy Hives   Banana Itching, Nausea And Vomiting and Other (See Comments)    Sick on the stomach   Chlorhexidine Itching   Ferrous Sulfate Itching   Iron Dextran Itching and Other (See Comments)    Vein irritation     Family History  Problem Relation Age of Onset   Arthritis Mother    Hypertension Mother    Aneurysm Mother        died of brain aneurysm   CAD Father        Has 3 stents   Diabetes Father        borderline   Early death Brother        Died in war   Colon cancer Neg Hx    Esophageal cancer Neg Hx    Rectal cancer Neg Hx    Stomach cancer Neg Hx      Prior to Admission medications   Medication Sig Start Date End Date Taking? Authorizing Provider  Calcium Carbonate Antacid (CALCIUM CARBONATE, DOSED IN MG ELEMENTAL CALCIUM,) 1250 MG/5ML SUSP Take 5 mLs by mouth daily. 10/22/20  Yes [provider]  cyclobenzaprine (FLEXERIL) 5 MG tablet Take 5 mg by mouth daily as needed for muscle spasms. 09/30/22  Yes [provider]  hydrOXYzine (ATARAX) 25 MG tablet Take 25-50 mg by mouth 2 (two) times daily as needed. 11/24/22  Yes [provider]  midodrine (PROAMATINE) 5 MG tablet Take 5 mg by mouth. 11/25/22  Yes [provider]  predniSONE (DELTASONE) 20 MG tablet Take 60 mg by mouth daily.  10/28/22  Yes [provider]  sucralfate (CARAFATE) 1 g tablet Take 1 g by mouth 2 (two) times daily. Take 1 tablet (1 g total) by mouth 2 (two) times daily as needed (indigestion) Works best if taken dissolved in two teaspoons water and taken as a slurry, but if not tolerable can take tablet whole 11/11/22  Yes [provider]  tiZANidine (ZANAFLEX) 2 MG tablet Take 2 mg by mouth 2 (two) times daily as needed. 10/28/22  Yes [provider]  acetaminophen (TYLENOL) 500 MG tablet Take 500-1,000 mg by mouth every 6 (six) hours as needed for moderate pain.    [provider]  cetirizine (ZYRTEC) 10 MG tablet Take 10 mg by mouth daily as needed for allergies.    [provider]  dicyclomine (BENTYL) 20 MG tablet  Take 1 tablet (20 mg total) by mouth 2 (two) times daily for 5 days. 12/13/22 12/18/22  Schutt, Edsel Petrin, PA-C  doxycycline (VIBRA-TABS) 100 MG tablet Take 1 tablet (100 mg total) by mouth 2 (two) times daily. 04/29/22   Nadara Mustard, MD  doxycycline (VIBRAMYCIN) 100 MG capsule Take 1 capsule (100 mg total) by mouth 2 (two) times daily. 04/09/22   Adonis Huguenin, NP  famotidine (PEPCID) 40 MG tablet Take 1 tablet (40 mg total) by mouth 2 (two) times daily as needed for heartburn or indigestion. Patient taking differently: Take 40 mg by mouth daily. 01/01/22   Lurene Shadow, MD  fluticasone (FLONASE) 50 MCG/ACT nasal spray Place 2 sprays into both nostrils daily as needed for allergies. 09/03/18   Glade Lloyd, MD  gabapentin (NEURONTIN) 600 MG tablet Take 600 mg by mouth 3 (three) times daily. 12/30/20   [provider]  HYDROcodone-acetaminophen (NORCO) 5-325 MG tablet Take 1 tablet by mouth every 6 (six) hours as needed. 04/09/22   Adonis Huguenin, NP  insulin aspart (NOVOLOG FLEXPEN) 100 UNIT/ML FlexPen Inject 0-15 Units into the skin in the morning, at noon, in the evening, and at bedtime. Sliding Scale insulin    [provider]   insulin degludec (TRESIBA) 100 UNIT/ML FlexTouch Pen Inject 5-10 Units into the skin daily. Patient taking differently: Inject 7 Units into the skin daily. 01/01/22   Lurene Shadow, MD  lidocaine-prilocaine (EMLA) cream Apply 1 application topically daily as needed [facility]). 12/17/20   [provider]  loperamide (IMODIUM) 2 MG capsule Take 2 mg by mouth as needed for diarrhea or loose stools. 09/11/20   [provider]  meclizine (ANTIVERT) 25 MG tablet Take 1 tablet (25 mg total) by mouth 3 (three) times daily as needed for dizziness. 06/10/22   Mesner, Barbara Cower, MD  metoprolol tartrate (LOPRESSOR) 25 MG tablet Take 1 tablet (25 mg total) by mouth daily as needed (for systolic blood pressure or 135 or above). 01/01/22   Lurene Shadow, MD  omeprazole (PRILOSEC) 40 MG capsule Take 1 capsule (40 mg total) by mouth 2 (two) times daily before a meal. Open capsule and place granules in water or applesauce to swallow 03/17/21   Iva Boop, MD  ondansetron (ZOFRAN-ODT) 4 MG disintegrating tablet Take 1 tablet (4 mg total) by mouth every 8 (eight) hours as needed for nausea or vomiting. 12/13/22   Schutt, Edsel Petrin, PA-C  oxyCODONE-acetaminophen (PERCOCET/ROXICET) 5-325 MG tablet Take 1 tablet by mouth daily as needed for severe pain. 01/01/22   Lurene Shadow, MD  promethazine (PHENERGAN) 25 MG tablet Take 1 tablet (25 mg total) by mouth every 6 (six) hours as needed for nausea or vomiting. 12/19/22   Sabas Sous, MD  RENVELA 2.4 g PACK Take 2.4 g by mouth 3 (three) times daily. 05/04/21   [provider]  simvastatin (ZOCOR) 20 MG tablet Take 20 mg by mouth at bedtime.    [provider]    Physical Exam: Vitals:   12/20/22 1930 12/20/22 1945 12/20/22 2044 12/20/22 2045  BP: (!) 200/122 (!) 212/128 (!) 158/107 (!) 146/101  Pulse:  (!) 106 (!) 111 (!) 110  Resp:   16 13  Temp:   98.8 F (37.1 C)   TempSrc:   Oral   SpO2:  98% 97% 97%  Weight:      Height:         Constitutional: NAD, no pallor or diaphoresis   Eyes: PERTLA,  lids and conjunctivae normal ENMT: Mucous membranes are dry. Posterior pharynx clear of any exudate or lesions.   Neck: supple, no masses  Respiratory: clear to auscultation bilaterally, no wheezing, no crackles. No accessory muscle use.  Cardiovascular: S1 & S2 heard, regular rate and rhythm. No extremity edema.   Abdomen: Soft, tender in upper abdomen. Bowel sounds hypoactive.  Musculoskeletal: no clubbing / cyanosis. No joint deformity upper and lower extremities.   Skin: no significant rashes, lesions, ulcers. Warm, dry, well-perfused. Neurologic: CN 2-12 grossly intact. Moving all extremites. Alert and oriented.  Psychiatric: Calm. Cooperative.    Labs and Imaging on Admission: I have personally reviewed following labs and imaging studies  CBC: Recent Labs  Lab 12/19/22 0424 12/20/22 1514  WBC 6.3 10.2  HGB 11.8* 13.4  HCT 37.3 42.2  MCV 95.2 96.8  PLT 217 231   Basic Metabolic Panel: Recent Labs  Lab 12/19/22 0424 12/20/22 1514  NA 136 138  K 4.0 4.3  CL 95* 95*  CO2 30 28  GLUCOSE 321* 329*  BUN 17 33*  CREATININE 6.51* 9.61*  CALCIUM 10.0 8.9   GFR: Estimated Creatinine Clearance: 8.4 mL/min (A) (by C-G formula based on SCr of 9.61 mg/dL (H)). Liver Function Tests: Recent Labs  Lab 12/19/22 0424 12/20/22 1514  AST 15 21  ALT 9 16  ALKPHOS 61 65  BILITOT 0.6 0.9  PROT 8.4* 9.9*  ALBUMIN 4.6 4.5   Recent Labs  Lab 12/19/22 0424 12/20/22 1514  LIPASE 38 47   No results for input(s): "AMMONIA" in the last 168 hours. Coagulation Profile: No results for input(s): "INR", "PROTIME" in the last 168 hours. Cardiac Enzymes: No results for input(s): "CKTOTAL", "CKMB", "CKMBINDEX", "TROPONINI" in the last 168 hours. BNP (last 3 results) No results for input(s): "PROBNP" in the last 8760 hours. HbA1C: No results for input(s): "HGBA1C" in the last 72 hours. CBG: Recent Labs  Lab  12/19/22 0310 12/20/22 1447  GLUCAP 283* 344*   Lipid Profile: No results for input(s): "CHOL", "HDL", "LDLCALC", "TRIG", "CHOLHDL", "LDLDIRECT" in the last 72 hours. Thyroid Function Tests: No results for input(s): "TSH", "T4TOTAL", "FREET4", "T3FREE", "THYROIDAB" in the last 72 hours. Anemia Panel: No results for input(s): "VITAMINB12", "FOLATE", "FERRITIN", "TIBC", "IRON", "RETICCTPCT" in the last 72 hours. Urine analysis:    Component Value Date/Time   COLORURINE RED (A) 09/03/2018 1118   APPEARANCEUR CLOUDY (A) 09/03/2018 1118   LABSPEC 1.015 09/03/2018 1118   PHURINE 8.0 09/03/2018 1118   GLUCOSEU 250 (A) 09/03/2018 1118   HGBUR LARGE (A) 09/03/2018 1118   HGBUR negative 01/26/2010 1026   BILIRUBINUR NEGATIVE 09/03/2018 1118   BILIRUBINUR NEG 01/01/2011 1050   KETONESUR NEGATIVE 09/03/2018 1118   PROTEINUR >300 (A) 09/03/2018 1118   UROBILINOGEN 0.2 01/09/2015 1333   NITRITE POSITIVE (A) 09/03/2018 1118   LEUKOCYTESUR TRACE (A) 09/03/2018 1118   Sepsis Labs: @LABRCNTIP (procalcitonin:4,lacticidven:4) )No results found for this or any previous visit (from the past 240 hour(s)).   Radiological Exams on Admission: CT ABDOMEN PELVIS WO CONTRAST  Result Date: 12/20/2022 CLINICAL DATA:  Nausea vomiting for 2 days EXAM: CT ABDOMEN AND PELVIS WITHOUT CONTRAST TECHNIQUE: Multidetector CT imaging of the abdomen and pelvis was performed following the standard protocol without IV contrast. RADIATION DOSE REDUCTION: This exam was performed according to the departmental dose-optimization program which includes automated exposure control, adjustment of the mA and/or kV according to patient size and/or use of iterative reconstruction technique. COMPARISON:  CT 12/13/2022, 12/26/2021, 08/09/2020 FINDINGS: Lower  chest: Lung bases demonstrate bandlike densities most likely atelectasis. Hepatobiliary: No focal liver abnormality is seen. Status post cholecystectomy. No biliary dilatation.  Pancreas: Unremarkable. No pancreatic ductal dilatation or surrounding inflammatory changes. Spleen: Upper normal in size at 12 cm Adrenals/Urinary Tract: Adrenal glands are normal. Atrophic left kidney with scarring. Nonvisualized right kidney. Soft tissue thickening and calcifications in the right lower quadrant corresponding to history of remote failed transplanted kidney. Bladder is decompressed. Stomach/Bowel: Stomach is moderately distended with fluid. Mild circumferential thickening of the distal esophagus with some fluid in the esophagus. Hazy appearance of the distal esophagus suggesting mild inflammation. No dilated small bowel. Negative appendix. No acute bowel wall thickening Vascular/Lymphatic: Nonaneurysmal aorta.  No suspicious lymph nodes. Reproductive: Hysterectomy.  No adnexal mass. Other: Negative for pelvic effusion or free air. Musculoskeletal: No acute or suspicious osseous abnormality. IMPRESSION: 1. Mild circumferential thickening of the distal esophagus with some fluid in the esophagus and hazy appearance of the distal esophagus suggesting mild inflammation/esophagitis. 2. Moderately distended stomach with fluid. 3. Atrophic left kidney with scarring. Nonvisualized right kidney. Soft tissue thickening and calcifications in the right lower quadrant corresponding to history of remote failed transplanted kidney. Electronically Signed   By: Jasmine Pang M.D.   On: 12/20/2022 22:02   CT Head Wo Contrast  Result Date: 12/20/2022 CLINICAL DATA:  Headache and dizziness. EXAM: CT HEAD WITHOUT CONTRAST TECHNIQUE: Contiguous axial images were obtained from the base of the skull through the vertex without intravenous contrast. RADIATION DOSE REDUCTION: This exam was performed according to the departmental dose-optimization program which includes automated exposure control, adjustment of the mA and/or kV according to patient size and/or use of iterative reconstruction technique. COMPARISON:  Head CT  dated 09/25/2022. FINDINGS: Brain: The ventricles and sulci are appropriate size for the patient's age. The gray-white matter discrimination is preserved. There is no acute intracranial hemorrhage. No mass effect or midline shift. No extra-axial fluid collection. Vascular: No hyperdense vessel or unexpected calcification. Skull: Normal. Negative for fracture or focal lesion. Sinuses/Orbits: No acute finding. Other: None IMPRESSION: Unremarkable noncontrast CT of the brain. Electronically Signed   By: Elgie Collard M.D.   On: 12/20/2022 21:53   DG Abdomen 1 View  Result Date: 12/20/2022 CLINICAL DATA:  Evaluate for small bowel obstruction, abdominal pain EXAM: ABDOMEN - 1 VIEW COMPARISON:  None Available. FINDINGS: Nonobstructive pattern of bowel gas. No free air in the abdomen on a single decubitus radiograph provided for review. No large burden of stool. IMPRESSION: Nonobstructive pattern of bowel gas. No free air in the abdomen on a single decubitus radiograph provided for review. No large burden of stool. Electronically Signed   By: Jearld Lesch M.D.   On: 12/20/2022 17:56    EKG: Independently reviewed. Sinus tachycardia, rate 100, QTc 503.   Assessment/Plan   1. Intractable abdominal pain and N/V  - Continue bowel rest, continue symptomatic management, monitor electrolytes and volume status, keep upcoming GI appt at [facility], may need GI consultation here if unable to tolerate adequate oral intake for discharge    2. Hypertensive urgency  - No target organ damage   - Continue metoprolol, hold midodrine, use hydralazine as needed    3. ESRD  - Nephrology planning to dialyze in am  - Renally-dose medications, restrict fluids   4. Insulin-dependent DM  - A1c was 9.1% in January 2024  - Check CBGs and treat with 5 units long-acting insulin daily and low-intensity SSI    DVT prophylaxis: sq heparin  Code Status: Full  Level of Care: Level of care: Progressive Family Communication: None  present Disposition Plan:  Patient is from: home Anticipated d/c is to: Home  Anticipated d/c date is: TBD Patient currently: Pending tolerance of adequate oral intake, pain-control, BP-control  Consults called: Nephrology  Admission status: Observation     Briscoe Deutscher, MD [facility]  12/21/2022, 12:07 AM

## 2022-12-22 DIAGNOSIS — R112 Nausea with vomiting, unspecified: Secondary | ICD-10-CM | POA: Diagnosis not present

## 2022-12-22 LAB — BASIC METABOLIC PANEL
Anion gap: 11 (ref 5–15)
BUN: 19 mg/dL (ref 6–20)
CO2: 27 mmol/L (ref 22–32)
Calcium: 8 mg/dL — ABNORMAL LOW (ref 8.9–10.3)
Chloride: 96 mmol/L — ABNORMAL LOW (ref 98–111)
Creatinine, Ser: 6.49 mg/dL — ABNORMAL HIGH (ref 0.44–1.00)
GFR, Estimated: 8 mL/min — ABNORMAL LOW (ref >60)
Glucose, Bld: 141 mg/dL — ABNORMAL HIGH (ref 70–99)
Potassium: 3.5 mmol/L (ref 3.5–5.1)
Sodium: 134 mmol/L — ABNORMAL LOW (ref 135–145)

## 2022-12-22 LAB — GLUCOSE, CAPILLARY
Glucose-Capillary: 120 mg/dL — ABNORMAL HIGH (ref 70–99)
Glucose-Capillary: 129 mg/dL — ABNORMAL HIGH (ref 70–99)
Glucose-Capillary: 141 mg/dL — ABNORMAL HIGH (ref 70–99)
Glucose-Capillary: 148 mg/dL — ABNORMAL HIGH (ref 70–99)

## 2022-12-22 LAB — CBC
HCT: 33.9 % — ABNORMAL LOW (ref 36.0–46.0)
Hemoglobin: 10.7 g/dL — ABNORMAL LOW (ref 12.0–15.0)
MCH: 30.6 pg (ref 26.0–34.0)
MCHC: 31.6 g/dL (ref 30.0–36.0)
MCV: 96.9 fL (ref 80.0–100.0)
Platelets: 185 10*3/uL (ref 150–400)
RBC: 3.5 MIL/uL — ABNORMAL LOW (ref 3.87–5.11)
RDW: 15.8 % — ABNORMAL HIGH (ref 11.5–15.5)
WBC: 8.7 10*3/uL (ref 4.0–10.5)
nRBC: 0 % (ref 0.0–0.2)

## 2022-12-22 LAB — HEPATITIS B SURFACE ANTIBODY, QUANTITATIVE: Hep B S AB Quant (Post): 71.2 m[IU]/mL

## 2022-12-22 MED ORDER — HYDRALAZINE HCL 20 MG/ML IJ SOLN
10.0000 mg | Freq: Four times a day (QID) | INTRAMUSCULAR | Status: DC | PRN
  Administered 2022-12-22: 10 mg via INTRAVENOUS
  Filled 2022-12-22: qty 1

## 2022-12-22 NOTE — Progress Notes (Signed)
Received patient in bed to unit.  Alert and oriented.  Informed consent signed and in chart.   TX duration: 3:00  Patient tolerated well.  Transported back to the room  Alert, without acute distress.  Hand-off given to patient's nurse.   Access used: LUE AVG Access issues: None  Total UF removed: 1200 mL Medication(s) given: Heparin bolus 4000 units; Heparin mid dose 2500 units Post HD VS: please see data insert    12/22/22 0130  Vitals  Temp 98.4 F (36.9 C)  Temp Source Oral  BP 104/78  MAP (mmHg) 87  BP Location Right Arm  BP Method Automatic  Patient Position (if appropriate) Lying  Pulse Rate 91  Pulse Rate Source Monitor  ECG Heart Rate 91  Resp 16  Oxygen Therapy  SpO2 97 %  O2 Device Nasal Cannula  O2 Flow Rate (L/min) 2 L/min  Patient Activity (if Appropriate) In bed  Pulse Oximetry Type Continuous  Post Treatment  Dialyzer Clearance Lightly streaked  Hemodialysis Intake (mL) 0 mL  Liters Processed 63.1  Fluid Removed (mL) 1200 mL  Tolerated HD Treatment Yes  Post-Hemodialysis Comments Treatment completed and blood returned without issue.  AVG/AVF Arterial Site Held (minutes) 10 minutes (Gauze applied and secured with paper tape.)  AVG/AVF Venous Site Held (minutes) 10 minutes (Gauze applied and secured with paper tape.)  Note  Patient Observations Patient alert, no c/o voiced, no acute distress noted; patient condition stable for return transport.  Fistula / Graft Left Upper arm Arteriovenous vein graft  No Placement Date or Time found.   Placed prior to admission: Yes  Orientation: Left  Access Location: Upper arm  Access Type: Arteriovenous vein graft  Site Condition No complications  Fistula / Graft Assessment Present;Thrill;Bruit  Status Flushed;Patent;Deaccessed  Drainage Description None      [facility]

## 2022-12-22 NOTE — Discharge Planning (Signed)
[facility] Patient Discharge Orders- [facility] CLINIC: AF  Patient's name: [name] Admit/DC Dates: 12/20/2022 - 12/22/2022  Discharge Diagnoses: Intractable n/v and abdominal pain - EGD at [facility] on 12/23/22    Hypertensive urgency  Aranesp: Given: no   Last Hgb: 10.7 PRBC's Given: no  ESA dose for discharge: no change IV Iron dose at discharge: no change  Heparin change: no   EDW Change: no New EDW:   [place] Change: no  Access intervention/Change: no Details:  Hectorol/Calcitriol change: no  Discharge Labs: Calcium 8.0 Albumin 4.5 K+ 3.5  IV Antibiotics: no Details:  On Coumadin?: no Last INR: Next INR: Managed By:   OTHER/APPTS/LAB ORDERS:    D/C Meds to be reconciled by nurse after every discharge.  Completed By: Virgina Norfolk, PA-C   Reviewed by: MD:______ RN_______

## 2022-12-22 NOTE — TOC Transition Note (Signed)
Transition of Care [facility]) - CM/SW Discharge Note   Patient Details  Name: [name] MRN: [id number] Date of Birth: 1985/07/22  [facility] CM/SW Contact:  Gordy Clement, RN Phone Number: 12/22/2022, 9:07 AM   Clinical Narrative:     Patient to DC to home today. There are no recommendations for follow up PT and OT or DME. Family to transport.   No additional TOC needs           Patient Goals and CMS Choice      Discharge Placement                         Discharge Plan and Services Additional resources added to the After Visit Summary for                                       Social Determinants of Health (SDOH) Interventions SDOH Screenings   Food Insecurity: Food Insecurity Present (12/21/2022)  Housing: Patient Declined (12/21/2022)  Transportation Needs: No Transportation Needs (12/21/2022)  Utilities: At Risk (12/21/2022)  Depression (PHQ2-9): Low Risk  (04/19/2018)  Financial Resource Strain: Low Risk  (10/24/2017)  Physical Activity: Sufficiently Active (10/24/2017)  Social Connections: Somewhat Isolated (10/24/2017)  Stress: No Stress Concern Present (10/24/2017)  Tobacco Use: Low Risk  (12/21/2022)   Received from [facility]     Readmission Risk Interventions     No data to display

## 2022-12-22 NOTE — Discharge Summary (Signed)
[name] ZOX:[id number] DOB: 1985-10-27 DOA: 12/20/2022  PCP: Fleet Contras, MD  Admit date: 12/20/2022  Discharge date: 12/22/2022  Admitted From: Home   Disposition:  Home   Recommendations for Outpatient Follow-up:   Follow up with PCP in 1-2 weeks  PCP Please obtain BMP/CBC, 2 view CXR in 1week,  (see Discharge instructions)   PCP Please follow up on the following pending results:    Home Health: None   Equipment/Devices: None  Consultations: Renal Discharge Condition: Stable    CODE STATUS: Full    Diet Recommendation: Soft, low carbohydrate/renal diet with 1.5 L fluid restriction.   Chief Complaint  Patient presents with   Abdominal Pain   Vomiting   Dizziness     Brief history of present illness from the day of admission and additional interim summary    37 y.o. year-old w/ PMH as below who presented to ED for n/v and abd pain. In ED BP was 230/115. Pt was admitted and started on pain meds, IV valium, carafate and IV bp lowering medications, with supportive care she has improved and now symptom-free.                                                                 Hospital Course   1. Intractable abdominal pain and N/V  - Pt describes sensation of pills becoming "stuck" at times, she is on a PPI and nausea medications along with chronic narcotics for abdominal pain, she has appointment with [facility] for EGD tomorrow on 12/23/2022.  She was treated here with supportive care with antinausea medications and bowel rest.  This morning she is completely symptom-free tolerating soft diet, eager to go home will be discharged home on home regimen with outpatient follow-up with her gastroenterologist at [facility] tomorrow.   2. Hypertensive urgency - Due to her nausea vomiting and abdominal pain completely  resolved, continue home blood pressure regimen, she is off of midodrine now and takes Lopressor for hypertension.   3. ESRD - Nephrology started underwent dialysis night of 12/21/2022, on TTS schedule postdischarge continue home regimen and follow with primary nephrologist within a week.   4. Insulin-dependent DM  - A1c was 9.1% in January 2024, Continue home regimen check CBGs q. [facility] and follow-up with PCP for further adjustment.  Discharge diagnosis     Principal Problem:   Intractable nausea and vomiting Active Problems:   ESRD on hemodialysis (HCC)   Uncontrolled type 1 diabetes mellitus with hyperglycemia (HCC)   Hypertensive urgency   Prolonged QT interval   [place] grade D esophagitis    Discharge instructions    Discharge Instructions     Discharge instructions   Complete by: As directed    Follow with Primary MD Fleet Contras, MD in 7 days  Get CBC, CMP, 2 view Chest X ray -  checked next visit with your primary MD   Activity: As tolerated with Full fall precautions use walker/cane & assistance as needed  Disposition Home   Diet: Soft, low carbohydrate-renal diet with 1.5 L fluid restriction per day.  Advance your diet after your EGD as instructed by the gastroenterologist.  Check CBGs q. [facility].  Special Instructions: If you have smoked or chewed Tobacco  in the last 2 yrs please stop smoking, stop any regular Alcohol  and or any Recreational drug use.  On your next visit with your primary care physician please Get Medicines reviewed and adjusted.  Please request your Prim.MD to go over all Hospital Tests and Procedure/Radiological results at the follow up, please get all Hospital records sent to your Prim MD by signing hospital release before you go home.  If you experience worsening of your admission symptoms, develop shortness of breath, life threatening emergency, suicidal or homicidal thoughts you must seek medical attention immediately by calling 911 or  calling your MD immediately  if symptoms less severe.  You Must read complete instructions/literature along with all the possible adverse reactions/side effects for all the Medicines you take and that have been prescribed to you. Take any new Medicines after you have completely understood and accpet all the possible adverse reactions/side effects.   Increase activity slowly   Complete by: As directed        Discharge Medications   Allergies as of 12/22/2022       Reactions   Diphenhydramine Anaphylaxis   Motrin [ibuprofen] Shortness Of Breath, Itching   Peanut-containing Drug Products Itching, Hives   Able to tolerate when cooked in foods   Contrast Media [iodinated Contrast Media] Itching   Shellfish Allergy Hives   Banana Itching, Nausea And Vomiting, Other (See Comments)   Sick on the stomach   Chlorhexidine Itching   Ferrous Sulfate Itching   Iron Dextran Itching, Other (See Comments)   Vein irritation        Medication List     STOP taking these medications    cetirizine 10 MG tablet Commonly known as: ZYRTEC   cyclobenzaprine 5 MG tablet Commonly known as: FLEXERIL   doxycycline 100 MG capsule Commonly known as: VIBRAMYCIN   doxycycline 100 MG tablet Commonly known as: VIBRA-TABS   famotidine 40 MG tablet Commonly known as: PEPCID   meclizine 25 MG tablet Commonly known as: ANTIVERT   midodrine 5 MG tablet Commonly known as: PROAMATINE   predniSONE 20 MG tablet Commonly known as: DELTASONE   sucralfate 1 g tablet Commonly known as: CARAFATE       TAKE these medications    acetaminophen 500 MG tablet Commonly known as: TYLENOL Take 500-1,000 mg by mouth every 6 (six) hours as needed for moderate pain.   calcium carbonate (dosed in mg elemental calcium) 1250 MG/5ML Susp Take 5 mLs by mouth daily.   dicyclomine 20 MG tablet Commonly known as: BENTYL Take 1 tablet (20 mg total) by mouth 2 (two) times daily for 5 days.   fluticasone 50  MCG/ACT nasal spray Commonly known as: FLONASE Place 2 sprays into both nostrils daily as needed for allergies.   gabapentin 600 MG tablet Commonly known as: NEURONTIN Take 600 mg by mouth 3 (three) times daily.   HYDROcodone-acetaminophen 5-325 MG tablet Commonly known as: Norco Take 1 tablet by mouth every 6 (six) hours as needed.   hydrOXYzine 25 MG tablet Commonly known  as: ATARAX Take 25-50 mg by mouth 2 (two) times daily as needed.   insulin degludec 100 UNIT/ML FlexTouch Pen Commonly known as: TRESIBA Inject 5-10 Units into the skin daily. What changed: how much to take   lidocaine-prilocaine cream Commonly known as: EMLA Apply 1 application topically daily as needed [facility]).   loperamide 2 MG capsule Commonly known as: IMODIUM Take 2 mg by mouth as needed for diarrhea or loose stools.   metoprolol tartrate 25 MG tablet Commonly known as: LOPRESSOR Take 1 tablet (25 mg total) by mouth daily as needed (for systolic blood pressure or 135 or above). What changed: when to take this   NovoLOG FlexPen 100 UNIT/ML FlexPen Generic drug: insulin aspart Inject 0-15 Units into the skin as needed for high blood sugar. Sliding Scale insulin   omeprazole 40 MG capsule Commonly known as: PRILOSEC Take 1 capsule (40 mg total) by mouth 2 (two) times daily before a meal. Open capsule and place granules in water or applesauce to swallow   ondansetron 4 MG disintegrating tablet Commonly known as: ZOFRAN-ODT Take 1 tablet (4 mg total) by mouth every 8 (eight) hours as needed for nausea or vomiting.   oxyCODONE-acetaminophen 5-325 MG tablet Commonly known as: PERCOCET/ROXICET Take 1 tablet by mouth daily as needed for severe pain.   promethazine 25 MG tablet Commonly known as: PHENERGAN Take 1 tablet (25 mg total) by mouth every 6 (six) hours as needed for nausea or vomiting.   Renvela 2.4 g Pack Generic drug: sevelamer carbonate Take 2.4 g by mouth 3 (three) times daily.    simvastatin 20 MG tablet Commonly known as: ZOCOR Take 20 mg by mouth every 3 (three) days. Monday, Wednesday, Friday   tiZANidine 2 MG tablet Commonly known as: ZANAFLEX Take 2 mg by mouth 2 (two) times daily as needed.         Follow-up Information     Fleet Contras, MD. Schedule an appointment as soon as possible for a visit in 1 week(s).   Specialty: Internal Medicine Why: With your gastroenterologist at [facility] on 12/23/2022, with your nephrologist within a week Contact information: [address] [place] [address] [phone]                 Major procedures and Radiology Reports - PLEASE review detailed and final reports thoroughly  -     DG CHEST PORT 1 VIEW  Result Date: 12/21/2022 CLINICAL DATA:  37 year old female with respiratory failure, hypoxia. EXAM: PORTABLE CHEST 1 VIEW COMPARISON:  Portable chest 03/29/2022 and earlier. FINDINGS: Portable AP semi upright view at 0613 hours. Lower lung volumes. Streaky bilateral lower lung peribronchial opacity, similar in appearance to CTA 12/27/2021. No superimposed pneumothorax, pulmonary edema, pleural effusion. X central aided cardiac size, within normal limits on previous exams. Other mediastinal contours are within normal limits. Visualized tracheal air column is within normal limits. No acute osseous abnormality identified. Paucity of bowel gas. IMPRESSION: Lower lung volumes with new streaky bilateral lower lung opacity. Top differential considerations include atelectasis and bronchopneumonia. No pleural effusion is evident. Electronically Signed   By: Odessa Fleming M.D.   On: 12/21/2022 06:44   CT ABDOMEN PELVIS WO CONTRAST  Result Date: 12/20/2022 CLINICAL DATA:  Nausea vomiting for 2 days EXAM: CT ABDOMEN AND PELVIS WITHOUT CONTRAST TECHNIQUE: Multidetector CT imaging of the abdomen and pelvis was performed following the standard protocol without IV contrast. RADIATION DOSE REDUCTION: This exam was performed  according to the departmental dose-optimization program which  includes automated exposure control, adjustment of the mA and/or kV according to patient size and/or use of iterative reconstruction technique. COMPARISON:  CT 12/13/2022, 12/26/2021, 08/09/2020 FINDINGS: Lower chest: Lung bases demonstrate bandlike densities most likely atelectasis. Hepatobiliary: No focal liver abnormality is seen. Status post cholecystectomy. No biliary dilatation. Pancreas: Unremarkable. No pancreatic ductal dilatation or surrounding inflammatory changes. Spleen: Upper normal in size at 12 cm Adrenals/Urinary Tract: Adrenal glands are normal. Atrophic left kidney with scarring. Nonvisualized right kidney. Soft tissue thickening and calcifications in the right lower quadrant corresponding to history of remote failed transplanted kidney. Bladder is decompressed. Stomach/Bowel: Stomach is moderately distended with fluid. Mild circumferential thickening of the distal esophagus with some fluid in the esophagus. Hazy appearance of the distal esophagus suggesting mild inflammation. No dilated small bowel. Negative appendix. No acute bowel wall thickening Vascular/Lymphatic: Nonaneurysmal aorta.  No suspicious lymph nodes. Reproductive: Hysterectomy.  No adnexal mass. Other: Negative for pelvic effusion or free air. Musculoskeletal: No acute or suspicious osseous abnormality. IMPRESSION: 1. Mild circumferential thickening of the distal esophagus with some fluid in the esophagus and hazy appearance of the distal esophagus suggesting mild inflammation/esophagitis. 2. Moderately distended stomach with fluid. 3. Atrophic left kidney with scarring. Nonvisualized right kidney. Soft tissue thickening and calcifications in the right lower quadrant corresponding to history of remote failed transplanted kidney. Electronically Signed   By: Jasmine Pang M.D.   On: 12/20/2022 22:02   CT Head Wo Contrast  Result Date: 12/20/2022 CLINICAL DATA:   Headache and dizziness. EXAM: CT HEAD WITHOUT CONTRAST TECHNIQUE: Contiguous axial images were obtained from the base of the skull through the vertex without intravenous contrast. RADIATION DOSE REDUCTION: This exam was performed according to the departmental dose-optimization program which includes automated exposure control, adjustment of the mA and/or kV according to patient size and/or use of iterative reconstruction technique. COMPARISON:  Head CT dated 09/25/2022. FINDINGS: Brain: The ventricles and sulci are appropriate size for the patient's age. The gray-white matter discrimination is preserved. There is no acute intracranial hemorrhage. No mass effect or midline shift. No extra-axial fluid collection. Vascular: No hyperdense vessel or unexpected calcification. Skull: Normal. Negative for fracture or focal lesion. Sinuses/Orbits: No acute finding. Other: None IMPRESSION: Unremarkable noncontrast CT of the brain. Electronically Signed   By: Elgie Collard M.D.   On: 12/20/2022 21:53   DG Abdomen 1 View  Result Date: 12/20/2022 CLINICAL DATA:  Evaluate for small bowel obstruction, abdominal pain EXAM: ABDOMEN - 1 VIEW COMPARISON:  None Available. FINDINGS: Nonobstructive pattern of bowel gas. No free air in the abdomen on a single decubitus radiograph provided for review. No large burden of stool. IMPRESSION: Nonobstructive pattern of bowel gas. No free air in the abdomen on a single decubitus radiograph provided for review. No large burden of stool. Electronically Signed   By: Jearld Lesch M.D.   On: 12/20/2022 17:56   CT ABDOMEN PELVIS WO CONTRAST  Result Date: 12/13/2022 CLINICAL DATA:  Abdominal pain. Nonlocalized. Dialysis patient. History of renal transplant with subsequent rejection. EXAM: CT ABDOMEN AND PELVIS WITHOUT CONTRAST TECHNIQUE: Multidetector CT imaging of the abdomen and pelvis was performed following the standard protocol without IV contrast. RADIATION DOSE REDUCTION: This exam  was performed according to the departmental dose-optimization program which includes automated exposure control, adjustment of the mA and/or kV according to patient size and/or use of iterative reconstruction technique. COMPARISON:  CT abdomen pelvis 09/25/2022 FINDINGS: Lower chest: No acute abnormality. Evaluation abdominal viscera is lack contrast. Hepatobiliary: No  new focal liver abnormality is seen. Status post cholecystectomy. Pancreas: Unremarkable. No surrounding inflammatory changes. Spleen: Normal in size without focal abnormality. Adrenals/Urinary Tract: Absent right kidney. Atrophic left kidney. Appearance of glands. No left renal calculi or hydronephrosis. Unremarkable urinary bladder. Stable postsurgical right quadrant. Stomach/Bowel: Stomach is within normal limits. Appendix appears normal. No evidence of bowel wall thickening, distention, or inflammatory changes. Vascular/Lymphatic: No enlarged abdominal or pelvic lymph nodes. Reproductive: No adnexal masses. Other: No abdominopelvic ascites. Musculoskeletal: No acute or significant osseous findings. IMPRESSION: 1. No acute findings in the abdomen or pelvis on a noncontrast exam. 2. Stable postsurgical changes in the right lower quadrant following renal transplant rejection. Electronically Signed   By: Emmaline Kluver M.D.   On: 12/13/2022 16:14    Micro Results    No results found for this or any previous visit (from the past 240 hour(s)).  Today   Subjective    Toshi Hafen today has no headache,no chest abdominal pain,no new weakness tingling or numbness, feels much better wants to go home today.    Objective   Blood pressure 104/80, pulse 93, temperature 98.4 F (36.9 C), temperature source Oral, resp. rate 14, height 5\' 6"  (1.676 m), weight 87.5 kg, SpO2 94%.   Intake/Output Summary (Last 24 hours) at 12/22/2022 0750 Last data filed at 12/22/2022 0130 Gross per 24 hour  Intake 3 ml  Output 1200 ml  Net -1197 ml     Exam  Awake Alert, No new F.N deficits,    [place].AT,PERRAL Supple Neck,   Symmetrical Chest wall movement, Good air movement bilaterally, CTAB RRR,No Gallops,   +ve B.Sounds, Abd Soft, Non tender,  No Cyanosis, Clubbing or edema    Data Review   Recent Labs  Lab 12/19/22 0424 12/20/22 1514 12/21/22 0040 12/21/22 2051 12/22/22 0411  WBC 6.3 10.2 11.5* 11.6* 8.7  HGB 11.8* 13.4 11.7* 10.4* 10.7*  HCT 37.3 42.2 37.5 33.7* 33.9*  PLT 217 231 213 174 185  MCV 95.2 96.8 96.9 95.7 96.9  MCH 30.1 30.7 30.2 29.5 30.6  MCHC 31.6 31.8 31.2 30.9 31.6  RDW 15.7* 15.4 15.8* 15.9* 15.8*    Recent Labs  Lab 12/19/22 0424 12/20/22 1514 12/21/22 0040 12/22/22 0411  NA 136 138 135 134*  K 4.0 4.3 4.3 3.5  CL 95* 95* 94* 96*  CO2 30 28 26 27   ANIONGAP 11 15 15 11   GLUCOSE 321* 329* 382* 141*  BUN 17 33* 46* 19  CREATININE 6.51* 9.61* 11.12* 6.49*  AST 15 21  --   --   ALT 9 16  --   --   ALKPHOS 61 65  --   --   BILITOT 0.6 0.9  --   --   ALBUMIN 4.6 4.5  --   --   HGBA1C  --   --  9.9*  --   MG  --   --  2.1  --   CALCIUM 10.0 8.9 8.0* 8.0*    Total Time in preparing paper work, data evaluation and todays exam - 35 minutes  Signature  -    Susa Raring M.D on 12/22/2022 at 7:50 AM   -  To page go to [email or web address]

## 2022-12-22 NOTE — Progress Notes (Signed)
D/C order noted. Contacted [facility] to advise clinic of pt's d/c today. Clinic advised that according to d/c summary, pt has an appt at [facility] tomorrow so unsure how pt plans to work that around HD day/time.   Olivia Canter Renal Navigator [phone]

## 2022-12-22 NOTE — Discharge Instructions (Addendum)
Follow with Primary MD Fleet Contras, MD in 7 days   Get CBC, CMP, 2 view Chest X ray -  checked next visit with your primary MD   Activity: As tolerated with Full fall precautions use walker/cane & assistance as needed  Disposition Home   Diet: Soft, low carbohydrate-renal diet with 1.5 L fluid restriction per day.  Advance your diet after your EGD as instructed by the gastroenterologist.  Check CBGs q. [facility].  Special Instructions: If you have smoked or chewed Tobacco  in the last 2 yrs please stop smoking, stop any regular Alcohol  and or any Recreational drug use.  On your next visit with your primary care physician please Get Medicines reviewed and adjusted.  Please request your Prim.MD to go over all Hospital Tests and Procedure/Radiological results at the follow up, please get all Hospital records sent to your Prim MD by signing hospital release before you go home.  If you experience worsening of your admission symptoms, develop shortness of breath, life threatening emergency, suicidal or homicidal thoughts you must seek medical attention immediately by calling 911 or calling your MD immediately  if symptoms less severe.  You Must read complete instructions/literature along with all the possible adverse reactions/side effects for all the Medicines you take and that have been prescribed to you. Take any new Medicines after you have completely understood and accpet all the possible adverse reactions/side effects.

## 2022-12-22 NOTE — Care Management Obs Status (Signed)
MEDICARE OBSERVATION STATUS NOTIFICATION   Patient Details  Name: [name] MRN: [id number] Date of Birth: May 27, 1985   Medicare Observation Status Notification Given:  Yes    Lawerance Sabal, RN 12/22/2022, 7:53 AM

## 2022-12-25 ENCOUNTER — Encounter (HOSPITAL_BASED_OUTPATIENT_CLINIC_OR_DEPARTMENT_OTHER): Payer: Self-pay | Admitting: Emergency Medicine

## 2022-12-25 ENCOUNTER — Emergency Department (HOSPITAL_BASED_OUTPATIENT_CLINIC_OR_DEPARTMENT_OTHER)
Admission: EM | Admit: 2022-12-25 | Discharge: 2022-12-26 | Disposition: A | Discharge status: Home / Self Care | Payer: 59 | Attending: Emergency Medicine | Admitting: Emergency Medicine

## 2022-12-25 ENCOUNTER — Other Ambulatory Visit: Payer: Self-pay

## 2022-12-25 DIAGNOSIS — Z91010 Allergy to peanuts: Secondary | ICD-10-CM | POA: Diagnosis not present

## 2022-12-25 DIAGNOSIS — R112 Nausea with vomiting, unspecified: Secondary | ICD-10-CM | POA: Diagnosis present

## 2022-12-25 DIAGNOSIS — K3184 Gastroparesis: Secondary | ICD-10-CM | POA: Diagnosis not present

## 2022-12-25 LAB — COMPREHENSIVE METABOLIC PANEL
ALT: 7 U/L (ref 0–44)
AST: 16 U/L (ref 15–41)
Albumin: 4.3 g/dL (ref 3.5–5.0)
Alkaline Phosphatase: 44 U/L (ref 38–126)
Anion gap: 12 (ref 5–15)
BUN: 14 mg/dL (ref 6–20)
CO2: 26 mmol/L (ref 22–32)
Calcium: 9.7 mg/dL (ref 8.9–10.3)
Chloride: 94 mmol/L — ABNORMAL LOW (ref 98–111)
Creatinine, Ser: 4.46 mg/dL — ABNORMAL HIGH (ref 0.44–1.00)
GFR, Estimated: 12 mL/min — ABNORMAL LOW (ref >60)
Glucose, Bld: 171 mg/dL — ABNORMAL HIGH (ref 70–99)
Potassium: 4.1 mmol/L (ref 3.5–5.1)
Sodium: 132 mmol/L — ABNORMAL LOW (ref 135–145)
Total Bilirubin: 0.6 mg/dL (ref 0.3–1.2)
Total Protein: 8.1 g/dL (ref 6.5–8.1)

## 2022-12-25 LAB — CBC
HCT: 35 % — ABNORMAL LOW (ref 36.0–46.0)
Hemoglobin: 11.3 g/dL — ABNORMAL LOW (ref 12.0–15.0)
MCH: 30.2 pg (ref 26.0–34.0)
MCHC: 32.3 g/dL (ref 30.0–36.0)
MCV: 93.6 fL (ref 80.0–100.0)
Platelets: 197 10*3/uL (ref 150–400)
RBC: 3.74 MIL/uL — ABNORMAL LOW (ref 3.87–5.11)
RDW: 15.2 % (ref 11.5–15.5)
WBC: 6.2 10*3/uL (ref 4.0–10.5)
nRBC: 0 % (ref 0.0–0.2)

## 2022-12-25 LAB — LIPASE, BLOOD: Lipase: 52 U/L — ABNORMAL HIGH (ref 11–51)

## 2022-12-25 MED ORDER — SODIUM CHLORIDE 0.9 % IV BOLUS
1000.0000 mL | Freq: Once | INTRAVENOUS | Status: AC
  Administered 2022-12-25: 1000 mL via INTRAVENOUS

## 2022-12-25 MED ORDER — PANTOPRAZOLE SODIUM 40 MG IV SOLR
40.0000 mg | Freq: Once | INTRAVENOUS | Status: AC
  Administered 2022-12-25: 40 mg via INTRAVENOUS
  Filled 2022-12-25: qty 10

## 2022-12-25 MED ORDER — ONDANSETRON HCL 4 MG/2ML IJ SOLN
4.0000 mg | Freq: Once | INTRAMUSCULAR | Status: AC | PRN
  Administered 2022-12-25: 4 mg via INTRAVENOUS
  Filled 2022-12-25: qty 2

## 2022-12-25 MED ORDER — MORPHINE SULFATE (PF) 4 MG/ML IV SOLN
4.0000 mg | Freq: Once | INTRAVENOUS | Status: AC
  Administered 2022-12-25: 4 mg via INTRAVENOUS
  Filled 2022-12-25: qty 1

## 2022-12-25 NOTE — ED Provider Notes (Signed)
[place] EMERGENCY DEPARTMENT AT [facility] Provider Note   CSN: [id number] Arrival date & time: 12/25/22  2006     History  Chief Complaint  Patient presents with   Abdominal Pain    [name] is a 37 y.o. female.  Patient is a 37 yo female with pmh of gastroparesis presenting for abdominal pain. Patient admits to chronic, recurrent, epigastric abdominal pain with nausea and vomiting. Patient had esophageal dilation on 9/19 with Dr. Adriana Simas. Was scheduled for another upper endoscopy yesterday with Dr. Adriana Simas at [facility] but was unable to have it performed due to severe inflammation. Patient was sent home with a prescription for Protonix and Pepcid solution but unable to keep it down at this time.   The history is provided by the patient. No language interpreter was used.  Abdominal Pain Associated symptoms: nausea and vomiting   Associated symptoms: no chest pain, no chills, no cough, no dysuria, no fever, no hematuria, no shortness of breath and no sore throat        Home Medications Prior to Admission medications   Medication Sig Start Date End Date Taking? Authorizing Provider  acetaminophen (TYLENOL) 500 MG tablet Take 500-1,000 mg by mouth every 6 (six) hours as needed for moderate pain.    [provider]  Calcium Carbonate Antacid (CALCIUM CARBONATE, DOSED IN MG ELEMENTAL CALCIUM,) 1250 MG/5ML SUSP Take 5 mLs by mouth daily. 10/22/20   [provider]  dicyclomine (BENTYL) 20 MG tablet Take 1 tablet (20 mg total) by mouth 2 (two) times daily for 5 days. 12/13/22 12/21/22  Schutt, Edsel Petrin, PA-C  fluticasone (FLONASE) 50 MCG/ACT nasal spray Place 2 sprays into both nostrils daily as needed for allergies. 09/03/18   Glade Lloyd, MD  gabapentin (NEURONTIN) 600 MG tablet Take 600 mg by mouth 3 (three) times daily. 12/30/20   [provider]  HYDROcodone-acetaminophen (NORCO) 5-325 MG tablet Take 1 tablet by mouth every 6 (six) hours as  needed. 04/09/22   Adonis Huguenin, NP  hydrOXYzine (ATARAX) 25 MG tablet Take 25-50 mg by mouth 2 (two) times daily as needed. 11/24/22   [provider]  insulin aspart (NOVOLOG FLEXPEN) 100 UNIT/ML FlexPen Inject 0-15 Units into the skin as needed for high blood sugar. Sliding Scale insulin    [provider]  insulin degludec (TRESIBA) 100 UNIT/ML FlexTouch Pen Inject 5-10 Units into the skin daily. Patient taking differently: Inject 7 Units into the skin daily. 01/01/22   Lurene Shadow, MD  lidocaine-prilocaine (EMLA) cream Apply 1 application topically daily as needed [facility]). 12/17/20   [provider]  loperamide (IMODIUM) 2 MG capsule Take 2 mg by mouth as needed for diarrhea or loose stools. 09/11/20   [provider]  metoprolol tartrate (LOPRESSOR) 25 MG tablet Take 1 tablet (25 mg total) by mouth daily as needed (for systolic blood pressure or 135 or above). Patient taking differently: Take 25 mg by mouth daily. 01/01/22   Lurene Shadow, MD  omeprazole (PRILOSEC) 40 MG capsule Take 1 capsule (40 mg total) by mouth 2 (two) times daily before a meal. Open capsule and place granules in water or applesauce to swallow 03/17/21   Iva Boop, MD  ondansetron (ZOFRAN-ODT) 4 MG disintegrating tablet Take 1 tablet (4 mg total) by mouth every 8 (eight) hours as needed for nausea or vomiting. 12/13/22   Schutt, Edsel Petrin, PA-C  oxyCODONE-acetaminophen (PERCOCET/ROXICET) 5-325 MG tablet Take 1 tablet by mouth daily as needed for  severe pain. 01/01/22   Lurene Shadow, MD  promethazine (PHENERGAN) 25 MG tablet Take 1 tablet (25 mg total) by mouth every 6 (six) hours as needed for nausea or vomiting. 12/19/22   Sabas Sous, MD  RENVELA 2.4 g PACK Take 2.4 g by mouth 3 (three) times daily. 05/04/21   [provider]  simvastatin (ZOCOR) 20 MG tablet Take 20 mg by mouth every 3 (three) days. Monday, Wednesday, Friday    [provider]  tiZANidine  (ZANAFLEX) 2 MG tablet Take 2 mg by mouth 2 (two) times daily as needed. 10/28/22   [provider]      Allergies    Diphenhydramine, Motrin [ibuprofen], Peanut-containing drug products, Contrast media [iodinated contrast media], Shellfish allergy, Banana, Chlorhexidine, Ferrous sulfate, and Iron dextran    Review of Systems   Review of Systems  Constitutional:  Negative for chills and fever.  HENT:  Negative for ear pain and sore throat.   Eyes:  Negative for pain and visual disturbance.  Respiratory:  Negative for cough and shortness of breath.   Cardiovascular:  Negative for chest pain and palpitations.  Gastrointestinal:  Positive for abdominal pain, nausea and vomiting.  Genitourinary:  Negative for dysuria and hematuria.  Musculoskeletal:  Negative for arthralgias and back pain.  Skin:  Negative for color change and rash.  Neurological:  Negative for seizures and syncope.  All other systems reviewed and are negative.   Physical Exam Updated Vital Signs BP (!) 196/114   Pulse 89   Temp 98.6 F (37 C) (Oral)   Resp 20   Wt 87.5 kg   SpO2 95%   BMI 31.14 kg/m  Physical Exam Vitals and nursing note reviewed.  Constitutional:      General: She is not in acute distress.    Appearance: She is well-developed.  HENT:     Head: Normocephalic and atraumatic.  Eyes:     Conjunctiva/sclera: Conjunctivae normal.  Cardiovascular:     Rate and Rhythm: Normal rate and regular rhythm.     Heart sounds: No murmur heard. Pulmonary:     Effort: Pulmonary effort is normal. No respiratory distress.     Breath sounds: Normal breath sounds.  Abdominal:     Palpations: Abdomen is soft.     Tenderness: There is no abdominal tenderness.  Musculoskeletal:        General: No swelling.     Cervical back: Neck supple.  Skin:    General: Skin is warm and dry.     Capillary Refill: Capillary refill takes less than 2 seconds.  Neurological:     Mental Status: She is alert.   Psychiatric:        Mood and Affect: Mood normal.     ED Results / Procedures / Treatments   Labs (all labs ordered are listed, but only abnormal results are displayed) Labs Reviewed  LIPASE, BLOOD - Abnormal; Notable for the following components:      Result Value   Lipase 52 (*)    All other components within normal limits  COMPREHENSIVE METABOLIC PANEL - Abnormal; Notable for the following components:   Sodium 132 (*)    Chloride 94 (*)    Glucose, Bld 171 (*)    Creatinine, Ser 4.46 (*)    GFR, Estimated 12 (*)    All other components within normal limits  CBC - Abnormal; Notable for the following components:   RBC 3.74 (*)    Hemoglobin 11.3 (*)  HCT 35.0 (*)    All other components within normal limits    EKG None  Radiology No results found.  Procedures Procedures    Medications Ordered in ED Medications  ondansetron (ZOFRAN) injection 4 mg (4 mg Intravenous Given 12/25/22 2133)  morphine (PF) 4 MG/ML injection 4 mg (4 mg Intravenous Given 12/25/22 2316)  pantoprazole (PROTONIX) injection 40 mg (40 mg Intravenous Given 12/25/22 2316)  sodium chloride 0.9 % bolus 1,000 mL (1,000 mLs Intravenous New Bag/Given 12/25/22 2318)    ED Course/ Medical Decision Making/ A&P                                 Medical Decision Making Amount and/or Complexity of Data Reviewed Labs: ordered.  Risk Prescription drug management.   37 yo female with pmh of gastroparesis presenting for abdominal pain. Patient admits to chronic, recurrent, epigastric abdominal pain with nausea and vomiting. Patient had esophageal dilation on 9/19 with Dr. Adriana Simas. Was scheduled for another upper endoscopy yesterday with Dr. Adriana Simas at [facility] but was unable to have it performed due to severe inflammation. Patient was sent home with a prescription for Protonix and Pepcid solution but unable to keep it down at this time.   On exam patient is Aox3, no acute distress, afebrile, with stable vitals.  Zofran and morphine given for nausea, vomiting, and pain. No leukocytosis on labs. No s/s sepsis. Stable liver profile, lipase, and renal function.   Goal will be to treat symptoms and have close outpatient f/u with established GI team.  No additional imaging recommended at this time.  Signed out to oncoming physician while awaiting reevaluation after medication and IV fluids.        Final Clinical Impression(s) / ED Diagnoses Final diagnoses:  Nausea and vomiting, unspecified vomiting type  Gastroparesis    Rx / DC Orders ED Discharge Orders     None         Franne Forts, DO 12/25/22 2347

## 2022-12-25 NOTE — ED Triage Notes (Signed)
Pt c/o epigastric pain 10/10 x 2 days with N/V. Pt states she had GI scope at [facility] on Thursday and pain as increased since. PT has ESRD on dialysis and HX of Gastroparesis. VSS NAD PT on room air.

## 2022-12-26 MED ORDER — MORPHINE SULFATE (PF) 4 MG/ML IV SOLN
4.0000 mg | Freq: Once | INTRAVENOUS | Status: AC
  Administered 2022-12-26: 4 mg via INTRAVENOUS
  Filled 2022-12-26: qty 1

## 2022-12-26 NOTE — Discharge Instructions (Signed)
Continue home medications as before.  Follow-up with your gastroenterologist if symptoms persist.

## 2022-12-30 ENCOUNTER — Other Ambulatory Visit: Payer: Self-pay

## 2022-12-30 ENCOUNTER — Emergency Department (HOSPITAL_BASED_OUTPATIENT_CLINIC_OR_DEPARTMENT_OTHER)
Admission: EM | Admit: 2022-12-30 | Discharge: 2022-12-31 | Discharge status: Against Medical Advice | Payer: 59 | Attending: Emergency Medicine | Admitting: Emergency Medicine

## 2022-12-30 ENCOUNTER — Encounter (HOSPITAL_BASED_OUTPATIENT_CLINIC_OR_DEPARTMENT_OTHER): Payer: Self-pay

## 2022-12-30 DIAGNOSIS — E1122 Type 2 diabetes mellitus with diabetic chronic kidney disease: Secondary | ICD-10-CM | POA: Insufficient documentation

## 2022-12-30 DIAGNOSIS — I120 Hypertensive chronic kidney disease with stage 5 chronic kidney disease or end stage renal disease: Secondary | ICD-10-CM | POA: Insufficient documentation

## 2022-12-30 DIAGNOSIS — R112 Nausea with vomiting, unspecified: Secondary | ICD-10-CM | POA: Diagnosis not present

## 2022-12-30 DIAGNOSIS — Z794 Long term (current) use of insulin: Secondary | ICD-10-CM | POA: Insufficient documentation

## 2022-12-30 DIAGNOSIS — Z79899 Other long term (current) drug therapy: Secondary | ICD-10-CM | POA: Diagnosis not present

## 2022-12-30 DIAGNOSIS — Z992 Dependence on renal dialysis: Secondary | ICD-10-CM | POA: Diagnosis not present

## 2022-12-30 DIAGNOSIS — D631 Anemia in chronic kidney disease: Secondary | ICD-10-CM | POA: Diagnosis not present

## 2022-12-30 DIAGNOSIS — Z8616 Personal history of COVID-19: Secondary | ICD-10-CM | POA: Insufficient documentation

## 2022-12-30 DIAGNOSIS — R1013 Epigastric pain: Secondary | ICD-10-CM | POA: Insufficient documentation

## 2022-12-30 DIAGNOSIS — N186 End stage renal disease: Secondary | ICD-10-CM | POA: Insufficient documentation

## 2022-12-30 DIAGNOSIS — G8929 Other chronic pain: Secondary | ICD-10-CM | POA: Diagnosis not present

## 2022-12-30 DIAGNOSIS — Z91010 Allergy to peanuts: Secondary | ICD-10-CM | POA: Insufficient documentation

## 2022-12-30 LAB — COMPREHENSIVE METABOLIC PANEL
ALT: 11 U/L (ref 0–44)
AST: 16 U/L (ref 15–41)
Albumin: 4.1 g/dL (ref 3.5–5.0)
Alkaline Phosphatase: 47 U/L (ref 38–126)
Anion gap: 11 (ref 5–15)
BUN: 10 mg/dL (ref 6–20)
CO2: 29 mmol/L (ref 22–32)
Calcium: 9.6 mg/dL (ref 8.9–10.3)
Chloride: 98 mmol/L (ref 98–111)
Creatinine, Ser: 5.64 mg/dL — ABNORMAL HIGH (ref 0.44–1.00)
GFR, Estimated: 9 mL/min — ABNORMAL LOW (ref >60)
Glucose, Bld: 159 mg/dL — ABNORMAL HIGH (ref 70–99)
Potassium: 3.8 mmol/L (ref 3.5–5.1)
Sodium: 138 mmol/L (ref 135–145)
Total Bilirubin: 0.5 mg/dL (ref 0.3–1.2)
Total Protein: 8.3 g/dL — ABNORMAL HIGH (ref 6.5–8.1)

## 2022-12-30 LAB — CBC
HCT: 33.4 % — ABNORMAL LOW (ref 36.0–46.0)
Hemoglobin: 10.7 g/dL — ABNORMAL LOW (ref 12.0–15.0)
MCH: 29.6 pg (ref 26.0–34.0)
MCHC: 32 g/dL (ref 30.0–36.0)
MCV: 92.3 fL (ref 80.0–100.0)
Platelets: 191 10*3/uL (ref 150–400)
RBC: 3.62 MIL/uL — ABNORMAL LOW (ref 3.87–5.11)
RDW: 14.6 % (ref 11.5–15.5)
WBC: 4.5 10*3/uL (ref 4.0–10.5)
nRBC: 0 % (ref 0.0–0.2)

## 2022-12-30 LAB — LIPASE, BLOOD: Lipase: 46 U/L (ref 11–51)

## 2022-12-30 NOTE — ED Triage Notes (Signed)
Pt POV from home reporting persistent epigastric pain r/t esophageal stricture, corrective surgery scheduled for Nov.

## 2022-12-31 ENCOUNTER — Emergency Department (HOSPITAL_COMMUNITY)
Admission: EM | Admit: 2022-12-31 | Discharge: 2022-12-31 | Disposition: A | Discharge status: Home / Self Care | Payer: 59 | Source: Home / Self Care | Attending: Emergency Medicine | Admitting: Emergency Medicine

## 2022-12-31 ENCOUNTER — Other Ambulatory Visit: Payer: Self-pay

## 2022-12-31 DIAGNOSIS — N186 End stage renal disease: Secondary | ICD-10-CM | POA: Insufficient documentation

## 2022-12-31 DIAGNOSIS — R1013 Epigastric pain: Secondary | ICD-10-CM | POA: Insufficient documentation

## 2022-12-31 DIAGNOSIS — D649 Anemia, unspecified: Secondary | ICD-10-CM | POA: Insufficient documentation

## 2022-12-31 DIAGNOSIS — Z794 Long term (current) use of insulin: Secondary | ICD-10-CM | POA: Insufficient documentation

## 2022-12-31 DIAGNOSIS — Z992 Dependence on renal dialysis: Secondary | ICD-10-CM | POA: Insufficient documentation

## 2022-12-31 DIAGNOSIS — R112 Nausea with vomiting, unspecified: Secondary | ICD-10-CM | POA: Insufficient documentation

## 2022-12-31 DIAGNOSIS — E1122 Type 2 diabetes mellitus with diabetic chronic kidney disease: Secondary | ICD-10-CM | POA: Insufficient documentation

## 2022-12-31 DIAGNOSIS — G8929 Other chronic pain: Secondary | ICD-10-CM | POA: Insufficient documentation

## 2022-12-31 DIAGNOSIS — Z79899 Other long term (current) drug therapy: Secondary | ICD-10-CM | POA: Insufficient documentation

## 2022-12-31 DIAGNOSIS — I120 Hypertensive chronic kidney disease with stage 5 chronic kidney disease or end stage renal disease: Secondary | ICD-10-CM | POA: Insufficient documentation

## 2022-12-31 DIAGNOSIS — I10 Essential (primary) hypertension: Secondary | ICD-10-CM

## 2022-12-31 LAB — TROPONIN I (HIGH SENSITIVITY)
Troponin I (High Sensitivity): 13 ng/L (ref <18)
Troponin I (High Sensitivity): 12 ng/L (ref <18)

## 2022-12-31 LAB — COMPREHENSIVE METABOLIC PANEL
ALT: 14 U/L (ref 0–44)
AST: 18 U/L (ref 15–41)
Albumin: 3.9 g/dL (ref 3.5–5.0)
Alkaline Phosphatase: 47 U/L (ref 38–126)
Anion gap: 10 (ref 5–15)
BUN: 12 mg/dL (ref 6–20)
CO2: 26 mmol/L (ref 22–32)
Calcium: 8.9 mg/dL (ref 8.9–10.3)
Chloride: 98 mmol/L (ref 98–111)
Creatinine, Ser: 5.78 mg/dL — ABNORMAL HIGH (ref 0.44–1.00)
GFR, Estimated: 9 mL/min — ABNORMAL LOW (ref >60)
Glucose, Bld: 134 mg/dL — ABNORMAL HIGH (ref 70–99)
Potassium: 3.7 mmol/L (ref 3.5–5.1)
Sodium: 134 mmol/L — ABNORMAL LOW (ref 135–145)
Total Bilirubin: 0.5 mg/dL (ref 0.3–1.2)
Total Protein: 7.8 g/dL (ref 6.5–8.1)

## 2022-12-31 LAB — CBC
HCT: 33.9 % — ABNORMAL LOW (ref 36.0–46.0)
Hemoglobin: 10.8 g/dL — ABNORMAL LOW (ref 12.0–15.0)
MCH: 30.6 pg (ref 26.0–34.0)
MCHC: 31.9 g/dL (ref 30.0–36.0)
MCV: 96 fL (ref 80.0–100.0)
Platelets: 174 10*3/uL (ref 150–400)
RBC: 3.53 MIL/uL — ABNORMAL LOW (ref 3.87–5.11)
RDW: 14.7 % (ref 11.5–15.5)
WBC: 4.3 10*3/uL (ref 4.0–10.5)
nRBC: 0 % (ref 0.0–0.2)

## 2022-12-31 LAB — LIPASE, BLOOD: Lipase: 44 U/L (ref 11–51)

## 2022-12-31 MED ORDER — SUCRALFATE 1 GM/10ML PO SUSP: 1.0000 g | Freq: Once | ORAL | Status: DC

## 2022-12-31 MED ORDER — OXYCODONE-ACETAMINOPHEN 5-325 MG PO TABS
1.0000 | ORAL_TABLET | Freq: Once | ORAL | Status: AC
  Administered 2022-12-31: 1 via ORAL
  Filled 2022-12-31: qty 1

## 2022-12-31 MED ORDER — ONDANSETRON 4 MG PO TBDP: 4.0000 mg | ORAL_TABLET | Freq: Three times a day (TID) | ORAL | 0 refills | Status: DC | PRN

## 2022-12-31 MED ORDER — PANTOPRAZOLE SODIUM 40 MG IV SOLR: 40.0000 mg | Freq: Once | INTRAVENOUS | Status: DC

## 2022-12-31 MED ORDER — ONDANSETRON 8 MG PO TBDP
8.0000 mg | ORAL_TABLET | Freq: Once | ORAL | Status: AC
  Administered 2022-12-31: 8 mg via ORAL
  Filled 2022-12-31: qty 1

## 2022-12-31 MED ORDER — METOPROLOL TARTRATE 25 MG PO TABS
25.0000 mg | ORAL_TABLET | Freq: Once | ORAL | Status: AC
  Administered 2022-12-31: 25 mg via ORAL
  Filled 2022-12-31: qty 1

## 2022-12-31 MED ORDER — METOCLOPRAMIDE HCL 5 MG/ML IJ SOLN: 10.0000 mg | Freq: Once | INTRAMUSCULAR | Status: DC

## 2022-12-31 MED ORDER — LIDOCAINE VISCOUS HCL 2 % MT SOLN: 15.0000 mL | Freq: Once | OROMUCOSAL | Status: DC

## 2022-12-31 NOTE — ED Provider Notes (Signed)
EMERGENCY DEPARTMENT AT [facility] Provider Note  CSN: [id number] Arrival date & time: 12/30/22 2129  Chief Complaint(s) Abdominal Pain  HPI [name] is a 37 y.o. female with extensive past medical history listed below including diabetes, gastroparesis, ESRD on dialysis TTS, gastritis/esophagitis with mild esophageal stricture requiring dilatation on September 19 at [facility].  Repeat EGD on 10 October revealed severe inflammation.  Patient was started on Protonix, Pepcid and Carafate.  She presents for exacerbation of chronic epigastric abdominal pain.  She reports that the Carafate does help with the pain at times.  However this past week it has not been helping.  She reports nausea and nonbloody nonbilious emesis with inability to tolerate p.o. intake.  The history is provided by the patient.    Past Medical History Past Medical History:  Diagnosis Date   Anemia    Blood transfusion without reported diagnosis    Cellulitis of left leg 03/01/2018   Chronic kidney disease    kidney transplant 07   Diabetes mellitus    Pt reports diagnosis in June 2011, Type 2   Diabetes mellitus without complication (HCC)    Esophageal obstruction due to food impaction    GERD (gastroesophageal reflux disease)    Hyperlipidemia    Hypertension    Intra-abdominal abscess (HCC) 10/28/2018   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   LEARNING DISABILITY 09/25/2007   Qualifier: Diagnosis of  By: Dayton Martes MD, Talia     Nausea and vomiting    Prolonged Q-T interval on ECG    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   Pyelonephritis 06/23/2014   Renal and perinephric abscess 11/01/2018   Renal disorder    Seasonal allergies    Seizures (HCC)    UTI (urinary tract infection) 01/09/2015   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   Qualifier: Diagnosis of  By: Sandi Mealy  MD, Judeth Cornfield     Patient Active Problem List   Diagnosis Date Noted   Open wound of left foot 04/02/2022   Osteomyelitis of great toe  of left foot (HCC) 03/30/2022   Diabetic foot ulcer (HCC) 03/29/2022   Abdominal pain, epigastric    Abnormal CT scan, colon    Hematemesis with nausea 12/28/2021   COVID-19 virus infection 12/27/2021   Liver lesion 12/27/2021   GI bleeding 12/26/2021   Esophageal dysphagia    Esophageal stricture    Seizures (HCC) 10/30/2020   [place] grade D esophagitis 08/09/2020   DKA, type 2 (HCC) 07/31/2020   Polyneuropathy associated with underlying disease (HCC) 04/29/2020   Renal osteodystrophy 04/22/2020   Hiatal hernia 04/22/2020   Overweight (BMI 25.0-29.9) 04/22/2020   Uncontrolled type 1 diabetes mellitus with hyperglycemia (HCC) 04/20/2020   GERD (gastroesophageal reflux disease) 04/20/2020   Prolonged QT interval 04/20/2020   Wide-complex tachycardia    Hypertensive urgency 10/11/2019   ESRD on hemodialysis (HCC) 05/01/2018   Iron deficiency anemia, unspecified 04/03/2018   Complication of vascular dialysis catheter 03/27/2018   Kidney transplant failure 03/27/2018   Renal sclerosis, unspecified 03/27/2018   Hyperlipidemia 03/01/2018   Metabolic acidosis 02/03/2018   Incontinence of bowel 02/03/2018   Chronic pain 11/11/2017   Chronic cholecystitis 06/29/2017   Diabetes mellitus type 1 (HCC) 05/25/2015   Nausea and vomiting    Intractable nausea and vomiting 01/09/2015   Renal transplant recipient    Immunosuppressed status (HCC)    ESRD (end stage renal disease) (HCC) 09/30/2014   Sepsis (HCC) 06/24/2014   Psychogenic nonepileptic 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 Authorizing Provider  acetaminophen (TYLENOL) 500 MG tablet Take 500-1,000 mg by mouth every 6 (six) hours as needed for moderate pain.    [provider]  Calcium Carbonate Antacid (CALCIUM CARBONATE, DOSED IN MG ELEMENTAL CALCIUM,) 1250 MG/5ML SUSP Take 5 mLs by mouth daily. 10/22/20   [provider]  dicyclomine (BENTYL) 20 MG tablet Take 1 tablet (20 mg total) by mouth 2 (two) times daily for 5 days. 12/13/22 12/21/22  Schutt, Edsel Petrin, PA-C  fluticasone (FLONASE) 50 MCG/ACT nasal spray Place 2 sprays into both nostrils daily as needed for allergies. 09/03/18   Glade Lloyd, MD  gabapentin (NEURONTIN) 600 MG tablet Take 600 mg by mouth 3 (three) times daily. 12/30/20   [provider]  HYDROcodone-acetaminophen (NORCO) 5-325 MG tablet Take 1 tablet by mouth every 6 (six) hours as needed. 04/09/22   Adonis Huguenin, NP  hydrOXYzine (ATARAX) 25 MG tablet Take 25-50 mg by mouth 2 (two) times daily as needed. 11/24/22   [provider]  insulin aspart (NOVOLOG FLEXPEN) 100 UNIT/ML FlexPen Inject 0-15 Units into the skin as needed for high blood sugar. Sliding Scale insulin    [provider]  insulin degludec (TRESIBA) 100 UNIT/ML FlexTouch Pen Inject 5-10 Units into the skin daily. Patient taking differently: Inject 7 Units into the skin daily. 01/01/22   Lurene Shadow, MD  lidocaine-prilocaine (EMLA) cream Apply 1 application topically daily as needed [facility]). 12/17/20   [provider]  loperamide (IMODIUM) 2 MG capsule Take 2 mg by mouth as needed for diarrhea or loose stools. 09/11/20   [provider]  metoprolol tartrate (LOPRESSOR) 25 MG tablet Take 1 tablet (25 mg total) by mouth daily as needed (for systolic blood pressure or 135 or above). Patient taking differently: Take 25 mg by mouth daily. 01/01/22   Lurene Shadow, MD  omeprazole (PRILOSEC) 40 MG capsule Take 1 capsule  (40 mg total) by mouth 2 (two) times daily before a meal. Open capsule and place granules in water or applesauce to swallow 03/17/21   Iva Boop, MD  ondansetron (ZOFRAN-ODT) 4 MG disintegrating tablet Take 1 tablet (4 mg total) by mouth every 8 (eight) hours as needed for nausea or vomiting. 12/13/22   Schutt, Edsel Petrin, PA-C  oxyCODONE-acetaminophen (PERCOCET/ROXICET) 5-325 MG tablet Take 1 tablet by mouth daily as needed for severe pain. 01/01/22   Lurene Shadow, MD  promethazine (PHENERGAN) 25 MG tablet Take 1 tablet (25 mg total) by mouth every 6 (six) hours as needed for nausea or vomiting. 12/19/22   Sabas Sous, MD  RENVELA 2.4 g PACK Take 2.4 g by mouth 3 (three) times daily. 05/04/21   [provider]  simvastatin (ZOCOR) 20 MG tablet Take 20 mg by mouth every 3 (three) days. Monday, Wednesday, Friday    [provider]  tiZANidine (ZANAFLEX) 2 MG tablet Take 2 mg by mouth 2 (two) times daily as needed. 10/28/22   [provider]  Allergies Diphenhydramine, Motrin [ibuprofen], Peanut-containing drug products, Contrast media [iodinated contrast media], Shellfish allergy, Banana, Chlorhexidine, Ferrous sulfate, and Iron dextran  Review of Systems Review of Systems As noted in HPI  Physical Exam Vital Signs  I have reviewed the triage vital signs BP (!) 155/125   Pulse 91   Temp 98.2 F (36.8 C) (Oral)   Resp 18   Ht 5\' 6"  (1.676 m)   Wt 80.7 kg   SpO2 97%   BMI 28.72 kg/m   Physical Exam Vitals reviewed.  Constitutional:      General: She is not in acute distress.    Appearance: She is well-developed. She is not diaphoretic.  HENT:     Head: Normocephalic and atraumatic.     Right Ear: External ear normal.     Left Ear: External ear normal.     Nose: Nose normal.  Eyes:     General: No scleral icterus.     Conjunctiva/sclera: Conjunctivae normal.  Neck:     Trachea: Phonation normal.  Cardiovascular:     Rate and Rhythm: Normal rate and regular rhythm.  Pulmonary:     Effort: Pulmonary effort is normal. No respiratory distress.     Breath sounds: No stridor.  Abdominal:     General: A surgical scar is present. There is no distension.  Musculoskeletal:        General: Normal range of motion.     Cervical back: Normal range of motion.  Neurological:     Mental Status: She is alert and oriented to person, place, and time.  Psychiatric:        Behavior: Behavior normal.     ED Results and Treatments Labs (all labs ordered are listed, but only abnormal results are displayed) Labs Reviewed  COMPREHENSIVE METABOLIC PANEL - Abnormal; Notable for the following components:      Result Value   Glucose, Bld 159 (*)    Creatinine, Ser 5.64 (*)    Total Protein 8.3 (*)    GFR, Estimated 9 (*)    All other components within normal limits  CBC - Abnormal; Notable for the following components:   RBC 3.62 (*)    Hemoglobin 10.7 (*)    HCT 33.4 (*)    All other components within normal limits  LIPASE, BLOOD  URINALYSIS, ROUTINE W REFLEX MICROSCOPIC  PREGNANCY, URINE                                                                                                                         EKG  EKG Interpretation Date/Time:  Thursday December 30 2022 22:10:17 EDT Ventricular Rate:  96 PR Interval:  154 QRS Duration:  66 QT Interval:  368 QTC Calculation: 464 R Axis:   87  Text Interpretation: Normal sinus rhythm Normal ECG When compared with ECG of 20-Dec-2022 14:03, PREVIOUS ECG IS PRESENT Confirmed by Drema Pry [phone]) on 12/31/2022 1:45:35 AM       Radiology No results found.  Medications Ordered in ED Medications - No data to display Procedures Procedures  (including critical care time) Medical Decision Making / ED Course   Medical Decision Making Amount and/or  Complexity of Data Reviewed Labs: ordered. Decision-making details documented in ED Course.  Risk Prescription drug management.    Labs drawn in triage: CBC without leukocytosis.  Mild anemia.  Metabolic panel without significant electrolyte derangement.  Hyperglycemia without evidence of DKA.  Baseline renal function.  No bili obstruction or pancreatitis.  Pain likely related to her gastritis/esophagitis versus gastroparesis. Plan for Reglan, Carafate suspension, viscous lidocaine, and IV Protonix.  Clinical Course as of 12/31/22 0220  Fri Dec 31, 2022  0155 Will not take other medication unless she get "morphine" to "cure the pain."  Informed that we will start with mentioned regimen first and reassess need for additional meds. [PC]  0202 Patient declined treatment and left AMA [PC]    Clinical Course User Index [PC] Bryceson Grape, Amadeo Garnet, MD    Final Clinical Impression(s) / ED Diagnoses Final diagnoses:  Epigastric pain    This chart was dictated using voice recognition software.  Despite best efforts to proofread,  errors can occur which can change the documentation meaning.    Nira Conn, MD 12/31/22 [phone]

## 2022-12-31 NOTE — Discharge Instructions (Addendum)
Zofran limited use only as needed for nausea and vomiting. Follow up with your PCP and GI.

## 2022-12-31 NOTE — ED Notes (Signed)
  Patient left out of room angry stating that she was leaving and "this ain't a real hospital".  Patient upset due to not receiving requested pain medication.

## 2022-12-31 NOTE — ED Notes (Signed)
RN made aware of PT BP

## 2022-12-31 NOTE — ED Triage Notes (Signed)
Pt reports that today while at dialysis pt started having upper abdominal pain. She says that they pulled off too much fluid today. She says she told the doctor at dialysis, he told her that if the pain became worse to come to the ED. Has not had her bp meds today.

## 2022-12-31 NOTE — ED Provider Notes (Signed)
[place] EMERGENCY DEPARTMENT AT [facility] Provider Note   CSN: [id number] Arrival date & time: 12/31/22  4098     History  Chief Complaint  Patient presents with   Abdominal Pain    [name] is a 37 y.o. female.  37 year old female with past medical history of ESRD on dialysis (Tuesday/Thursday/Saturday, last dialysis session was Thursday (yesterday) states that she feels like they pulled off too much fluid), diabetes, seizures.  Presents with complaint of acute on chronic abdominal pain and vomiting.  States that she is seen by [facility], recent EGD found to have significant esophagitis and is on Carafate.  She does not have anything at home to take for her vomiting.  Has not vomited since arrival.  Has not taken her metoprolol today, last dose was on Wednesday and states that she does not take this medication on dialysis days.  Denies chest pain, shortness of breath, unilateral weakness or numbness.  No other complaints or concerns.       Home Medications Prior to Admission medications   Medication Sig Start Date End Date Taking? Authorizing Provider  acetaminophen (TYLENOL) 500 MG tablet Take 500-1,000 mg by mouth every 6 (six) hours as needed for moderate pain.    [provider]  Calcium Carbonate Antacid (CALCIUM CARBONATE, DOSED IN MG ELEMENTAL CALCIUM,) 1250 MG/5ML SUSP Take 5 mLs by mouth daily. 10/22/20   [provider]  dicyclomine (BENTYL) 20 MG tablet Take 1 tablet (20 mg total) by mouth 2 (two) times daily for 5 days. 12/13/22 12/21/22  Schutt, Edsel Petrin, PA-C  fluticasone (FLONASE) 50 MCG/ACT nasal spray Place 2 sprays into both nostrils daily as needed for allergies. 09/03/18   Glade Lloyd, MD  gabapentin (NEURONTIN) 600 MG tablet Take 600 mg by mouth 3 (three) times daily. 12/30/20   [provider]  HYDROcodone-acetaminophen (NORCO) 5-325 MG tablet Take 1 tablet by mouth every 6 (six) hours as needed. 04/09/22   Adonis Huguenin, NP  hydrOXYzine (ATARAX) 25 MG tablet Take 25-50 mg by mouth 2 (two) times daily as needed. 11/24/22   [provider]  insulin aspart (NOVOLOG FLEXPEN) 100 UNIT/ML FlexPen Inject 0-15 Units into the skin as needed for high blood sugar. Sliding Scale insulin    [provider]  insulin degludec (TRESIBA) 100 UNIT/ML FlexTouch Pen Inject 5-10 Units into the skin daily. Patient taking differently: Inject 7 Units into the skin daily. 01/01/22   Lurene Shadow, MD  lidocaine-prilocaine (EMLA) cream Apply 1 application topically daily as needed [facility]). 12/17/20   [provider]  loperamide (IMODIUM) 2 MG capsule Take 2 mg by mouth as needed for diarrhea or loose stools. 09/11/20   [provider]  metoprolol tartrate (LOPRESSOR) 25 MG tablet Take 1 tablet (25 mg total) by mouth daily as needed (for systolic blood pressure or 135 or above). Patient taking differently: Take 25 mg by mouth daily. 01/01/22   Lurene Shadow, MD  omeprazole (PRILOSEC) 40 MG capsule Take 1 capsule (40 mg total) by mouth 2 (two) times daily before a meal. Open capsule and place granules in water or applesauce to swallow 03/17/21   Iva Boop, MD  ondansetron (ZOFRAN-ODT) 4 MG disintegrating tablet Take 1 tablet (4 mg total) by mouth every 8 (eight) hours as needed for nausea or vomiting. 12/31/22   Jeannie Fend, PA-C  oxyCODONE-acetaminophen (PERCOCET/ROXICET) 5-325 MG tablet Take 1 tablet by mouth daily as needed for severe pain. 01/01/22  Lurene Shadow, MD  promethazine (PHENERGAN) 25 MG tablet Take 1 tablet (25 mg total) by mouth every 6 (six) hours as needed for nausea or vomiting. 12/19/22   Sabas Sous, MD  RENVELA 2.4 g PACK Take 2.4 g by mouth 3 (three) times daily. 05/04/21   [provider]  simvastatin (ZOCOR) 20 MG tablet Take 20 mg by mouth every 3 (three) days. Monday, Wednesday, Friday    [provider]  tiZANidine (ZANAFLEX) 2 MG tablet Take 2  mg by mouth 2 (two) times daily as needed. 10/28/22   [provider]      Allergies    Diphenhydramine, Motrin [ibuprofen], Peanut-containing drug products, Contrast media [iodinated contrast media], Shellfish allergy, Banana, Chlorhexidine, Ferrous sulfate, and Iron dextran    Review of Systems   Review of Systems Negative except as per HPI Physical Exam Updated Vital Signs BP (!) 158/102 (BP Location: Right Arm)   Pulse 75   Temp 98.6 F (37 C) (Oral)   Resp 16   SpO2 100%  Physical Exam Vitals and nursing note reviewed.  Constitutional:      General: She is not in acute distress.    Appearance: She is well-developed. She is not diaphoretic.  HENT:     Head: Normocephalic and atraumatic.  Cardiovascular:     Rate and Rhythm: Normal rate and regular rhythm.     Heart sounds: Normal heart sounds.  Pulmonary:     Effort: Pulmonary effort is normal.     Breath sounds: Normal breath sounds.  Abdominal:     General: A surgical scar is present.     Palpations: Abdomen is soft.     Tenderness: There is no abdominal tenderness.  Skin:    General: Skin is warm and dry.     Findings: No erythema or rash.  Neurological:     Mental Status: She is alert and oriented to person, place, and time.  Psychiatric:        Behavior: Behavior normal.     ED Results / Procedures / Treatments   Labs (all labs ordered are listed, but only abnormal results are displayed) Labs Reviewed  COMPREHENSIVE METABOLIC PANEL - Abnormal; Notable for the following components:      Result Value   Sodium 134 (*)    Glucose, Bld 134 (*)    Creatinine, Ser 5.78 (*)    GFR, Estimated 9 (*)    All other components within normal limits  CBC - Abnormal; Notable for the following components:   RBC 3.53 (*)    Hemoglobin 10.8 (*)    HCT 33.9 (*)    All other components within normal limits  LIPASE, BLOOD  TROPONIN I (HIGH SENSITIVITY)  TROPONIN I (HIGH SENSITIVITY)    EKG EKG  Interpretation Date/Time:  Friday December 31 2022 02:53:20 EDT Ventricular Rate:  99 PR Interval:  154 QRS Duration:  72 QT Interval:  380 QTC Calculation: 487 R Axis:   94  Text Interpretation: Normal sinus rhythm Rightward axis Cannot rule out Anterior infarct , age undetermined Abnormal ECG When compared with ECG of 30-Dec-2022 22:10, PREVIOUS ECG IS PRESENT No significant change was found Confirmed by Glynn Octave [phone]) on 12/31/2022 4:29:03 AM  Radiology No results found.  Procedures Procedures    Medications Ordered in ED Medications  ondansetron (ZOFRAN-ODT) disintegrating tablet 8 mg (8 mg Oral Given 12/31/22 0822)  oxyCODONE-acetaminophen (PERCOCET/ROXICET) 5-325 MG per tablet 1 tablet (1 tablet Oral Given 12/31/22 9811)  metoprolol  tartrate (LOPRESSOR) tablet 25 mg (25 mg Oral Given 12/31/22 1610)    ED Course/ Medical Decision Making/ A&P                                 Medical Decision Making Amount and/or Complexity of Data Reviewed Labs: ordered.  Risk Prescription drug management.   This patient presents to the ED for concern of abdominal pain, vomiting, this involves an extensive number of treatment options, and is a complaint that carries with it a high risk of complications and morbidity.  The differential diagnosis includes but not limited to gastritis, bowel obstruction, HTN   Co morbidities that complicate the patient evaluation  ESRD on dialysis, hypertension, seizures, diabetes, hyperlipidemia, GERD   Additional history obtained:  External records from outside source obtained and reviewed including prior labs on file for comparison   Lab Tests:  I Ordered, and personally interpreted labs.  The pertinent results include: CBC with baseline anemia with hemoglobin of 10.8.  Lipase normal.  Troponins are unchanged at 8 and 12.  CMP with creatinine of 5.70, not significant change from prior, potassium is normal.   Cardiac Monitoring: /  EKG:  The patient was maintained on a cardiac monitor.  I personally viewed and interpreted the cardiac monitored which showed an underlying rhythm of: Normal sinus rhythm, rate 99   Consultations Obtained:  I requested consultation with the ER attending, Dr. Dalene Seltzer,  and discussed lab and imaging findings as well as pertinent plan - they recommend: HTN management    Problem List / ED Course / Critical interventions / Medication management  37 year old female with complaint as above.  Noted to be significantly hypertensive, has not taken her metoprolol since Wednesday, does not take her metoprolol on dialysis days (Thursday).  Reports acute on chronic abdominal pain, known significant esophagitis, most recently observed on EGD at [facility] on 12/23/2022.  Patient reports compliance with her Carafate.  She is provided with home dose of her metoprolol.  Blood pressure has improved, pain improved after Percocet, not vomiting.  She is requesting discharge labs are without significant findings.  Her Zofran was refilled.  She inquires about prescription for Percocet or Vicodin, deferred to PCP care team. I ordered medication including metoprolol, Zofran, Percocet for hyper engine, nausea, pain Reevaluation of the patient after these medicines showed that the patient resolved I have reviewed the patients home medicines and have made adjustments as needed   Social Determinants of Health:  Has specialty care team   Test / Admission - Considered:  Improved, stable for dc, requesting dc         Final Clinical Impression(s) / ED Diagnoses Final diagnoses:  Epigastric pain  Nausea and vomiting, unspecified vomiting type  Hypertension, unspecified type    Rx / DC Orders ED Discharge Orders          Ordered    ondansetron (ZOFRAN-ODT) 4 MG disintegrating tablet  Every 8 hours PRN        12/31/22 1009              Jeannie Fend, PA-C 12/31/22 1127    Alvira Monday,  MD 12/31/22 2258

## 2023-01-03 NOTE — Plan of Care (Signed)
[facility] Tonsillectomy/Adenoidectomy, Postoperative PEDS care plan entered in error.

## 2023-01-31 ENCOUNTER — Other Ambulatory Visit: Payer: Self-pay

## 2023-01-31 DIAGNOSIS — N186 End stage renal disease: Secondary | ICD-10-CM

## 2023-02-14 ENCOUNTER — Ambulatory Visit (INDEPENDENT_AMBULATORY_CARE_PROVIDER_SITE_OTHER)
Admission: RE | Admit: 2023-02-14 | Discharge: 2023-02-14 | Disposition: A | Discharge status: Home / Self Care | Payer: 59 | Source: Ambulatory Visit | Attending: Surgery | Admitting: Surgery

## 2023-02-14 ENCOUNTER — Ambulatory Visit (HOSPITAL_COMMUNITY)
Admission: RE | Admit: 2023-02-14 | Discharge: 2023-02-14 | Disposition: A | Discharge status: Home / Self Care | Payer: 59 | Source: Ambulatory Visit | Attending: Surgery | Admitting: Surgery

## 2023-02-14 ENCOUNTER — Encounter: Payer: Self-pay | Admitting: Vascular Surgery

## 2023-02-14 DIAGNOSIS — N186 End stage renal disease: Secondary | ICD-10-CM

## 2023-02-22 ENCOUNTER — Ambulatory Visit (INDEPENDENT_AMBULATORY_CARE_PROVIDER_SITE_OTHER): Payer: 59 | Admitting: Vascular Surgery

## 2023-02-22 ENCOUNTER — Other Ambulatory Visit: Payer: Self-pay

## 2023-02-22 ENCOUNTER — Encounter: Payer: Self-pay | Admitting: Vascular Surgery

## 2023-02-22 VITALS — BP 171/104 | HR 88 | Temp 98.1°F | Wt 184.0 lb

## 2023-02-22 DIAGNOSIS — N186 End stage renal disease: Secondary | ICD-10-CM | POA: Diagnosis not present

## 2023-02-22 DIAGNOSIS — Z992 Dependence on renal dialysis: Secondary | ICD-10-CM | POA: Diagnosis not present

## 2023-02-22 MED ORDER — SODIUM CHLORIDE 0.9% FLUSH: 3.0000 mL | INTRAVENOUS | Status: DC | PRN

## 2023-02-22 MED ORDER — SODIUM CHLORIDE 0.9% FLUSH: 3.0000 mL | Freq: Two times a day (BID) | INTRAVENOUS | Status: DC

## 2023-02-22 MED ORDER — SODIUM CHLORIDE 0.9 % IV SOLN: 250.0000 mL | INTRAVENOUS | Status: AC | PRN

## 2023-02-22 NOTE — Progress Notes (Signed)
Patient name: [name] MRN: [id number] DOB: 07/27/1985 Sex: female  REASON FOR VISIT: Evaluate left arm AV graft  HPI: [name] is a 37 y.o. female with history end-stage renal disease, DM, HTN, HLD who presents for evaluation of her left arm AV graft.  Most recently underwent a redo left arm AV graft on 09/24/2020.  This has been working well until recently.  She had a left arm fistulogram with [facility] per the report the graft was patent but the brachial vein was occluded which is the outflow for the graft.  She states her graft worked today in dialysis.  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  Past Medical History:  Diagnosis Date   Anemia    Blood transfusion without reported diagnosis    Cellulitis of left leg 03/01/2018   Chronic kidney disease    kidney transplant 07   Diabetes mellitus    Pt reports diagnosis in June 2011, Type 2   Diabetes mellitus without complication (HCC)    Esophageal obstruction due to food impaction    GERD (gastroesophageal reflux disease)    Hyperlipidemia    Hypertension    Intra-abdominal abscess (HCC) 10/28/2018   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   LEARNING DISABILITY 09/25/2007   Qualifier: Diagnosis of  By: Dayton Martes MD, Talia     Nausea and vomiting    Prolonged Q-T interval on ECG    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   Pyelonephritis 06/23/2014   Renal and perinephric abscess 11/01/2018   Renal disorder    Seasonal allergies    Seizures (HCC)    UTI (urinary tract infection) 01/09/2015   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   Qualifier: Diagnosis of  By: Sandi Mealy  MD, Judeth Cornfield      Past Surgical History:  Procedure Laterality Date   AMPUTATION Left 04/02/2022   Procedure: AMPUTATION LEFT GREAT TOE;  Surgeon: Nadara Mustard, MD;  Location: [facility] OR;  Service: Orthopedics;  Laterality: Left;   ARTERIOVENOUS GRAFT PLACEMENT Bilateral    "neither  work" (10/24/2017)   AV FISTULA PLACEMENT Left 10/26/2018   Procedure: CREATION OF ARTERIOVENOUS FISTULA  LEFT ARM;  Surgeon: Cephus Shelling, MD;  Location: [facility] OR;  Service: Vascular;  Laterality: Left;   AV FISTULA PLACEMENT Left 05/23/2020   Procedure: LEFT ARM ARTERIOVENOUS (AV) FISTULA CREATION;  Surgeon: Cephus Shelling, MD;  Location: [facility] OR;  Service: Vascular;  Laterality: Left;   BALLOON DILATION N/A 01/19/2021   Procedure: Marvis Repress DILATION;  Surgeon: Iva Boop, MD;  Location: [facility] ENDOSCOPY;  Service: Endoscopy;  Laterality: N/A;   BALLOON DILATION N/A 02/10/2021   Procedure: BALLOON DILATION;  Surgeon: Hilarie Fredrickson, MD;  Location: [facility] ENDOSCOPY;  Service: Endoscopy;  Laterality: N/A;   BALLOON DILATION N/A 02/19/2021   Procedure: BALLOON DILATION;  Surgeon: Imogene Burn, MD;  Location: [facility] ENDOSCOPY;  Service: Gastroenterology;  Laterality: N/A;   BASCILIC VEIN TRANSPOSITION Left 12/21/2018   Procedure: Left arm BASILIC VEIN TRANSPOSITION SECOND STAGE;  Surgeon: Cephus Shelling, MD;  Location: [facility] OR;  Service: Vascular;  Laterality: Left;   BIOPSY  12/29/2021   Procedure: BIOPSY;  Surgeon: Meryl Dare, MD;  Location: [facility] ENDOSCOPY;  Service: Gastroenterology;;   CHOLECYSTECTOMY N/A 06/30/2017   Procedure: LAPAROSCOPIC CHOLECYSTECTOMY WITH INTRAOPERATIVE CHOLANGIOGRAM;  Surgeon: Glenna Fellows, MD;  Location: [facility] ORS;  Service: General;  Laterality: N/A;   ESOPHAGOGASTRODUODENOSCOPY (EGD) WITH  PROPOFOL N/A 07/04/2017   Procedure: ESOPHAGOGASTRODUODENOSCOPY (EGD) WITH PROPOFOL;  Surgeon: Vida Rigger, MD;  Location: [facility] ENDOSCOPY;  Service: Endoscopy;  Laterality: N/A;   ESOPHAGOGASTRODUODENOSCOPY (EGD) WITH PROPOFOL N/A 08/10/2020   Procedure: ESOPHAGOGASTRODUODENOSCOPY (EGD) WITH PROPOFOL;  Surgeon: Sherrilyn Rist, MD;  Location: [facility] ENDOSCOPY;  Service: Gastroenterology;  Laterality: N/A;   ESOPHAGOGASTRODUODENOSCOPY (EGD) WITH PROPOFOL N/A 01/19/2021   Procedure:  ESOPHAGOGASTRODUODENOSCOPY (EGD) WITH PROPOFOL;  Surgeon: Iva Boop, MD;  Location: [facility] ENDOSCOPY;  Service: Endoscopy;  Laterality: N/A;  WITH FLUOROSCOPY AND DILATION   ESOPHAGOGASTRODUODENOSCOPY (EGD) WITH PROPOFOL N/A 02/03/2021   Procedure: ESOPHAGOGASTRODUODENOSCOPY (EGD) WITH PROPOFOL;  Surgeon: Iva Boop, MD;  Location: [facility] ENDOSCOPY;  Service: Endoscopy;  Laterality: N/A;   ESOPHAGOGASTRODUODENOSCOPY (EGD) WITH PROPOFOL N/A 02/10/2021   Procedure: ESOPHAGOGASTRODUODENOSCOPY (EGD) WITH PROPOFOL;  Surgeon: Hilarie Fredrickson, MD;  Location: [facility] ENDOSCOPY;  Service: Endoscopy;  Laterality: N/A;  Balloon Dilation   ESOPHAGOGASTRODUODENOSCOPY (EGD) WITH PROPOFOL N/A 02/19/2021   Procedure: ESOPHAGOGASTRODUODENOSCOPY (EGD) WITH PROPOFOL;  Surgeon: Imogene Burn, MD;  Location: [facility] ENDOSCOPY;  Service: Gastroenterology;  Laterality: N/A;   ESOPHAGOGASTRODUODENOSCOPY (EGD) WITH PROPOFOL N/A 03/12/2021   Procedure: ESOPHAGOGASTRODUODENOSCOPY (EGD) WITH PROPOFOL;  Surgeon: Shellia Cleverly, DO;  Location: [facility] ENDOSCOPY;  Service: Gastroenterology;  Laterality: N/A;   ESOPHAGOGASTRODUODENOSCOPY (EGD) WITH PROPOFOL N/A 12/29/2021   Procedure: ESOPHAGOGASTRODUODENOSCOPY (EGD) WITH PROPOFOL;  Surgeon: Meryl Dare, MD;  Location: [facility] ENDOSCOPY;  Service: Gastroenterology;  Laterality: N/A;   FLEXIBLE SIGMOIDOSCOPY N/A 12/29/2021   Procedure: FLEXIBLE SIGMOIDOSCOPY;  Surgeon: Meryl Dare, MD;  Location: [facility] ENDOSCOPY;  Service: Gastroenterology;  Laterality: N/A;   FOREIGN BODY REMOVAL N/A 02/03/2021   Procedure: FOREIGN BODY REMOVAL;  Surgeon: Iva Boop, MD;  Location: [facility] ENDOSCOPY;  Service: Endoscopy;  Laterality: N/A;   INSERTION OF DIALYSIS CATHETER N/A 03/20/2018   Procedure: INSERTION OF TUNNELED DIALYSIS CATHETER - RIGHT INTERANL JUGULAR PLACEMENT;  Surgeon: Chuck Hint, MD;  Location: [facility] OR;  Service: Vascular;  Laterality: N/A;   IR FLUORO GUIDE CV LINE RIGHT  04/18/2020    IR GUIDED DRAIN W CATHETER PLACEMENT  10/28/2018   KIDNEY TRANSPLANT  2007   KIDNEY TRANSPLANT Right    PARATHYROIDECTOMY  ?2012   "3/4 removed" (10/24/2017)   RENAL BIOPSY Bilateral 2003   REVISON OF ARTERIOVENOUS FISTULA Left 09/24/2020   Procedure: LEFT UPPER ARM ARTERIOVENOUS GRAFT CREATION;  Surgeon: Cephus Shelling, MD;  Location: [facility] OR;  Service: Vascular;  Laterality: Left;   UPPER EXTREMITY VENOGRAPHY Bilateral 10/19/2018   Procedure: UPPER EXTREMITY VENOGRAPHY;  Surgeon: Cephus Shelling, MD;  Location: [facility] INVASIVE CV LAB;  Service: Cardiovascular;  Laterality: Bilateral;  Bilateral    UPPER EXTREMITY VENOGRAPHY Left 09/04/2020   Procedure: UPPER EXTREMITY VENOGRAPHY - Left Upper;  Surgeon: Cephus Shelling, MD;  Location: [facility] INVASIVE CV LAB;  Service: Cardiovascular;  Laterality: Left;    Family History  Problem Relation Age of Onset   Arthritis Mother    Hypertension Mother    Aneurysm Mother        died of brain aneurysm   CAD Father        Has 3 stents   Diabetes Father        borderline   Early death Brother        Died in war   Colon cancer Neg Hx    Esophageal cancer Neg Hx    Rectal cancer Neg Hx    Stomach cancer Neg Hx  SOCIAL HISTORY: Social History   Tobacco Use   Smoking status: Never    Passive exposure: Never   Smokeless tobacco: Never  Substance Use Topics   Alcohol use: Not Currently    Allergies  Allergen Reactions   Diphenhydramine Anaphylaxis   Motrin [Ibuprofen] Shortness Of Breath and Itching   Peanut-Containing Drug Products Itching and Hives    Able to tolerate when cooked in foods   Contrast Media [Iodinated Contrast Media] Itching   Shellfish Allergy Hives   Banana Itching, Nausea And Vomiting and Other (See Comments)    Sick on the stomach   Chlorhexidine Itching   Ferrous Sulfate Itching   Iron Dextran Itching and Other (See Comments)    Vein irritation     Current Outpatient Medications  Medication Sig  Dispense Refill   acetaminophen (TYLENOL) 500 MG tablet Take 500-1,000 mg by mouth every 6 (six) hours as needed for moderate pain.     Calcium Carbonate Antacid (CALCIUM CARBONATE, DOSED IN MG ELEMENTAL CALCIUM,) 1250 MG/5ML SUSP Take 5 mLs by mouth daily.     fluticasone (FLONASE) 50 MCG/ACT nasal spray Place 2 sprays into both nostrils daily as needed for allergies.     gabapentin (NEURONTIN) 600 MG tablet Take 600 mg by mouth 3 (three) times daily.     HYDROcodone-acetaminophen (NORCO) 5-325 MG tablet Take 1 tablet by mouth every 6 (six) hours as needed. 30 tablet 0   hydrOXYzine (ATARAX) 25 MG tablet Take 25-50 mg by mouth 2 (two) times daily as needed.     insulin aspart (NOVOLOG FLEXPEN) 100 UNIT/ML FlexPen Inject 0-15 Units into the skin as needed for high blood sugar. Sliding Scale insulin     insulin degludec (TRESIBA) 100 UNIT/ML FlexTouch Pen Inject 5-10 Units into the skin daily. (Patient taking differently: Inject 7 Units into the skin daily.)     lidocaine-prilocaine (EMLA) cream Apply 1 application topically daily as needed [facility]).     loperamide (IMODIUM) 2 MG capsule Take 2 mg by mouth as needed for diarrhea or loose stools.     metoprolol tartrate (LOPRESSOR) 25 MG tablet Take 1 tablet (25 mg total) by mouth daily as needed (for systolic blood pressure or 135 or above). (Patient taking differently: Take 25 mg by mouth daily.)     omeprazole (PRILOSEC) 40 MG capsule Take 1 capsule (40 mg total) by mouth 2 (two) times daily before a meal. Open capsule and place granules in water or applesauce to swallow 180 capsule 3   ondansetron (ZOFRAN-ODT) 4 MG disintegrating tablet Take 1 tablet (4 mg total) by mouth every 8 (eight) hours as needed for nausea or vomiting. 6 tablet 0   oxyCODONE-acetaminophen (PERCOCET/ROXICET) 5-325 MG tablet Take 1 tablet by mouth daily as needed for severe pain. 5 tablet 0   promethazine (PHENERGAN) 25 MG tablet Take 1 tablet (25 mg total) by mouth every 6  (six) hours as needed for nausea or vomiting. 30 tablet 0   RENVELA 2.4 g PACK Take 2.4 g by mouth 3 (three) times daily.     simvastatin (ZOCOR) 20 MG tablet Take 20 mg by mouth every 3 (three) days. Monday, Wednesday, Friday     tiZANidine (ZANAFLEX) 2 MG tablet Take 2 mg by mouth 2 (two) times daily as needed.     dicyclomine (BENTYL) 20 MG tablet Take 1 tablet (20 mg total) by mouth 2 (two) times daily for 5 days. 10 tablet 0   No current facility-administered medications for this visit.  REVIEW OF SYSTEMS:  [X]  denotes positive finding, [ ]  denotes negative finding Cardiac  Comments:  Chest pain or chest pressure:    Shortness of breath upon exertion:    Short of breath when lying flat:    Irregular heart rhythm:        Vascular    Pain in calf, thigh, or hip brought on by ambulation:    Pain in feet at night that wakes you up from your sleep:     Blood clot in your veins:    Leg swelling:         Pulmonary    Oxygen at home:    Productive cough:     Wheezing:         Neurologic    Sudden weakness in arms or legs:     Sudden numbness in arms or legs:     Sudden onset of difficulty speaking or slurred speech:    Temporary loss of vision in one eye:     Problems with dizziness:         Gastrointestinal    Blood in stool:     Vomited blood:         Genitourinary    Burning when urinating:     Blood in urine:        Psychiatric    Major depression:         Hematologic    Bleeding problems:    Problems with blood clotting too easily:        Skin    Rashes or ulcers:        Constitutional    Fever or chills:      PHYSICAL EXAM: There were no vitals filed for this visit.  GENERAL: The patient is a well-nourished female, in no acute distress. The vital signs are documented above. CARDIAC: There is a regular rate and rhythm.  VASCULAR: Left upper arm AV graft pulsatile PULMONARY: No respiratory distress. ABDOMEN: Soft and non-tender.   DATA:   Duplex  shows left upper arm AV graft is patent with nonocclusive thrombus in the outflow  Assessment/Plan:  37 y.o. female with history end-stage renal disease, DM, HTN, HLD who presents for evaluation of her left arm AV graft.  Most recently underwent a redo left arm AV graft on 09/24/2020.This has been working well until recently.  She had a left arm fistulogram with [facility] per the report the graft was patent but the brachial vein was occluded which is the outflow for the graft.  I have recommended a left arm fistulogram to further evaluate her options.  I do not have any images from [facility].  Unclear if I could try an endovascular attempt to get her graft open versus a surgical revision.  Risk benefits discussed.  Will get scheduled for Thursday.   Cephus Shelling, MD [facility] Office: [phone]

## 2023-02-24 ENCOUNTER — Other Ambulatory Visit: Payer: Self-pay

## 2023-02-24 ENCOUNTER — Encounter (HOSPITAL_COMMUNITY)
Admission: RE | Disposition: A | Discharge status: Home / Self Care | Payer: Self-pay | Source: Home / Self Care | Attending: Vascular Surgery

## 2023-02-24 ENCOUNTER — Ambulatory Visit (HOSPITAL_COMMUNITY)
Admission: RE | Admit: 2023-02-24 | Discharge: 2023-02-24 | Disposition: A | Discharge status: Home / Self Care | Payer: 59 | Attending: Vascular Surgery | Admitting: Vascular Surgery

## 2023-02-24 DIAGNOSIS — T82898A Other specified complication of vascular prosthetic devices, implants and grafts, initial encounter: Secondary | ICD-10-CM | POA: Diagnosis present

## 2023-02-24 DIAGNOSIS — Z940 Kidney transplant status: Secondary | ICD-10-CM | POA: Insufficient documentation

## 2023-02-24 DIAGNOSIS — I120 Hypertensive chronic kidney disease with stage 5 chronic kidney disease or end stage renal disease: Secondary | ICD-10-CM | POA: Insufficient documentation

## 2023-02-24 DIAGNOSIS — N185 Chronic kidney disease, stage 5: Secondary | ICD-10-CM | POA: Diagnosis not present

## 2023-02-24 DIAGNOSIS — N186 End stage renal disease: Secondary | ICD-10-CM | POA: Diagnosis not present

## 2023-02-24 DIAGNOSIS — E1122 Type 2 diabetes mellitus with diabetic chronic kidney disease: Secondary | ICD-10-CM | POA: Insufficient documentation

## 2023-02-24 DIAGNOSIS — Z992 Dependence on renal dialysis: Secondary | ICD-10-CM | POA: Diagnosis not present

## 2023-02-24 DIAGNOSIS — Y841 Kidney dialysis as the cause of abnormal reaction of the patient, or of later complication, without mention of misadventure at the time of the procedure: Secondary | ICD-10-CM | POA: Diagnosis not present

## 2023-02-24 DIAGNOSIS — E785 Hyperlipidemia, unspecified: Secondary | ICD-10-CM | POA: Diagnosis not present

## 2023-02-24 DIAGNOSIS — Z794 Long term (current) use of insulin: Secondary | ICD-10-CM | POA: Diagnosis not present

## 2023-02-24 HISTORY — PX: A/V FISTULAGRAM: CATH118298

## 2023-02-24 LAB — POCT I-STAT, CHEM 8
BUN: 34 mg/dL — ABNORMAL HIGH (ref 6–20)
Calcium, Ion: 1.1 mmol/L — ABNORMAL LOW (ref 1.15–1.40)
Chloride: 99 mmol/L (ref 98–111)
Creatinine, Ser: 7.1 mg/dL — ABNORMAL HIGH (ref 0.44–1.00)
Glucose, Bld: 348 mg/dL — ABNORMAL HIGH (ref 70–99)
HCT: 33 % — ABNORMAL LOW (ref 36.0–46.0)
Hemoglobin: 11.2 g/dL — ABNORMAL LOW (ref 12.0–15.0)
Potassium: 4.8 mmol/L (ref 3.5–5.1)
Sodium: 135 mmol/L (ref 135–145)
TCO2: 25 mmol/L (ref 22–32)

## 2023-02-24 LAB — HCG, SERUM, QUALITATIVE: Preg, Serum: NEGATIVE

## 2023-02-24 SURGERY — A/V FISTULAGRAM
Anesthesia: LOCAL | Laterality: Left

## 2023-02-24 MED ORDER — FAMOTIDINE IN NACL 20-0.9 MG/50ML-% IV SOLN
20.0000 mg | INTRAVENOUS | Status: AC
  Administered 2023-02-24: 20 mg via INTRAVENOUS
  Filled 2023-02-24: qty 50

## 2023-02-24 MED ORDER — METHYLPREDNISOLONE SODIUM SUCC 125 MG IJ SOLR
125.0000 mg | INTRAMUSCULAR | Status: AC
  Administered 2023-02-24: 125 mg via INTRAVENOUS
  Filled 2023-02-24: qty 2

## 2023-02-24 MED ORDER — HYDRALAZINE HCL 20 MG/ML IJ SOLN
INTRAMUSCULAR | Status: AC
  Filled 2023-02-24: qty 1

## 2023-02-24 MED ORDER — IODIXANOL 320 MG/ML IV SOLN
INTRAVENOUS | Status: DC | PRN
  Administered 2023-02-24: 60 mL

## 2023-02-24 MED ORDER — HYDRALAZINE HCL 20 MG/ML IJ SOLN
INTRAMUSCULAR | Status: DC | PRN
  Administered 2023-02-24: 10 mg via INTRAVENOUS

## 2023-02-24 MED ORDER — LIDOCAINE HCL (PF) 1 % IJ SOLN
INTRAMUSCULAR | Status: AC
  Filled 2023-02-24: qty 30

## 2023-02-24 MED ORDER — LIDOCAINE HCL (PF) 1 % IJ SOLN
INTRAMUSCULAR | Status: DC | PRN
  Administered 2023-02-24: 10 mL

## 2023-02-24 SURGICAL SUPPLY — 8 items
CATH ANGIO 5F BER2 65CM (CATHETERS) IMPLANT
COVER DOME SNAP 22 D (MISCELLANEOUS) ×1 IMPLANT
GUIDEWIRE ANGLED .035X150CM (WIRE) IMPLANT
KIT MICROPUNCTURE NIT STIFF (SHEATH) IMPLANT
SHEATH PINNACLE R/O II 5F 6CM (SHEATH) IMPLANT
SHEATH PROBE COVER 6X72 (BAG) ×1 IMPLANT
TRAY PV CATH (CUSTOM PROCEDURE TRAY) ×1 IMPLANT
TUBING CIL FLEX 10 FLL-RA (TUBING) ×1 IMPLANT

## 2023-02-24 NOTE — Op Note (Signed)
    OPERATIVE NOTE  DATE: February 24, 2023  PROCEDURE: left upper arm arteriovenous graft cannulation under ultrasound guidance left arm fistulogram with central venogram  PRE-OPERATIVE DIAGNOSIS: Malfunctioning left arteriovenous graft  POST-OPERATIVE DIAGNOSIS: same as above   SURGEON: Cephus Shelling, MD  ANESTHESIA: local  ESTIMATED BLOOD LOSS: 5 cc  FINDING(S): The left upper arm AV graft had no venous outflow that is occluded - although the graft itself remains patent.  We did visualize the axillary vein and adjacent brachial vein that was patent.  Unable to cross venous outflow occlusion of AV graft.  Will need revision with a jump graft.  SPECIMEN(S):  None  CONTRAST: 60 mL  INDICATIONS: [name] is a 37 y.o. female who  presents with malfunctioning left arm arteriovenous graft.  The patient is scheduled for left arm fistulogram.  The patient is aware the risks include but are not limited to: bleeding, infection, thrombosis of the cannulated access, and possible anaphylactic reaction to the contrast.  The patient is aware of the risks of the procedure and elects to proceed forward.  DESCRIPTION: After full informed written consent was obtained, the patient was brought back to the angiography suite and placed supine upon the angiography table.  The patient was connected to monitoring equipment.  The left arm was prepped and draped in the standard fashion for a left arm fistulogram.  Under ultrasound guidance, the left arm arteriovenous graft was cannulated with a micropuncture needle.  The microwire was advanced into the fistula and the needle was exchanged for the a microsheath, which was lodged 2 cm into the access.  The wire was removed and the sheath was connected to the IV extension tubing.  Hand injections were completed to image the access from the antecubitum up to the level of axilla.  The central venous structures were also imaged by hand injections.  The  graft had an occluded venous outflow.  The graft itself is patent and was refluxing into the brachial artery.  I then upsized to a 5 [place] sheath and used a Glidewire and angled catheter to try and cross the outflow occlusion.  We were unsuccessful.  Will need open surgical revision with jump graft..  A 4-0 Monocryl purse-string suture was sewn around the sheath.  The sheath was removed while tying down the suture.  A sterile bandage was applied to the puncture site.  COMPLICATIONS: None  CONDITION: Stable  Cephus Shelling, MD [facility] Office: [phone]  Cephus Shelling   02/24/2023 4:17 PM

## 2023-02-24 NOTE — Progress Notes (Signed)
Notified [name] of glucose 348 and BP 175/118. [name] will notify Dr. Chestine Spore.

## 2023-02-24 NOTE — Progress Notes (Signed)
Patient underwent left arm fistulogram today.  Will need surgical revision due to occluded venous outflow.  Her sats were in the mid to upper 80s after the case.  She was premedicated for contrast allergy.  She appears in no distress.  Denies any chest pain, shortness of breath.  States she has had some trouble swallowing in the past.  She did get dialysis earlier today.  I have offered her overnight for observation to watch her saturations.  She does not want to stay in the hospital tonight and states she needs to get home.  I again discussed the option of staying.  Cephus Shelling, MD [facility] [place] Office: [phone]   Cephus Shelling

## 2023-02-24 NOTE — Progress Notes (Signed)
Dr Chestine Spore assessed pt low sats, pt states she feels good, declines spending the evening to watch her sats, pt has contrast allergy and allergy to benadryl. Again pt declines to stay and requests to go home.

## 2023-02-24 NOTE — Progress Notes (Signed)
Veronica called back from [employer] and stated Dr. Chestine Spore is okay with BP and gluc 348.

## 2023-02-24 NOTE — H&P (Signed)
History and Physical Interval Note:  02/24/2023 2:22 PM  [name]  has presented today for surgery, with the diagnosis of instage renal.  The various methods of treatment have been discussed with the patient and family. After consideration of risks, benefits and other options for treatment, the patient has consented to  Procedure(s): A/V Fistulagram (N/A) as a surgical intervention.  The patient's history has been reviewed, patient examined, no change in status, stable for surgery.  I have reviewed the patient's chart and labs.  Questions were answered to the patient's satisfaction.     Cephus Shelling     Patient name: [name]           MRN: [id number]        DOB: 1985/09/13          Sex: female   REASON FOR VISIT: Evaluate left arm AV graft   HPI: [name] is a 37 y.o. female with history end-stage renal disease, DM, HTN, HLD who presents for evaluation of her left arm AV graft.  Most recently underwent a redo left arm AV graft on 09/24/2020.  This has been working well until recently.  She had a left arm fistulogram with [facility] per the report the graft was patent but the brachial vein was occluded which is the outflow for the graft.  She states her graft worked today in dialysis.   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       Past Medical History:  Diagnosis Date   Anemia     Blood transfusion without reported diagnosis     Cellulitis of left leg 03/01/2018   Chronic kidney disease      kidney transplant 07   Diabetes mellitus      Pt reports diagnosis in June 2011, Type 2   Diabetes mellitus without complication (HCC)     Esophageal obstruction due to food impaction     GERD (gastroesophageal reflux disease)     Hyperlipidemia     Hypertension     Intra-abdominal abscess (HCC) 10/28/2018   Kidney transplant recipient 2007    solitary kidney   LEARNING DISABILITY  09/25/2007    Qualifier: Diagnosis of  By: Dayton Martes MD, Talia     Nausea and vomiting     Prolonged Q-T interval on ECG     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   Pyelonephritis 06/23/2014   Renal and perinephric abscess 11/01/2018   Renal disorder     Seasonal allergies     Seizures (HCC)     UTI (urinary tract infection) 01/09/2015   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    Qualifier: Diagnosis of  By: Sandi Mealy  MD, Judeth Cornfield                 Past Surgical History:  Procedure Laterality Date   AMPUTATION Left 04/02/2022    Procedure: AMPUTATION LEFT GREAT TOE;  Surgeon: Nadara Mustard, MD;  Location: [facility] OR;  Service: Orthopedics;  Laterality: Left;   ARTERIOVENOUS GRAFT PLACEMENT Bilateral      "neither work" (10/24/2017)   AV FISTULA PLACEMENT Left 10/26/2018    Procedure: CREATION OF ARTERIOVENOUS FISTULA  LEFT ARM;  Surgeon: Cephus Shelling, MD;  Location: [facility] OR;  Service: Vascular;  Laterality: Left;   AV FISTULA PLACEMENT Left 05/23/2020    Procedure: LEFT ARM ARTERIOVENOUS (AV) FISTULA CREATION;  Surgeon: Cephus Shelling, MD;  Location: [facility] OR;  Service: Vascular;  Laterality: Left;   BALLOON DILATION N/A 01/19/2021    Procedure: BALLOON DILATION;  Surgeon: Iva Boop, MD;  Location: [facility] ENDOSCOPY;  Service: Endoscopy;  Laterality: N/A;   BALLOON DILATION N/A 02/10/2021    Procedure: BALLOON DILATION;  Surgeon: Hilarie Fredrickson, MD;  Location: [facility] ENDOSCOPY;  Service: Endoscopy;  Laterality: N/A;   BALLOON DILATION N/A 02/19/2021    Procedure: BALLOON DILATION;  Surgeon: Imogene Burn, MD;  Location: [facility] ENDOSCOPY;  Service: Gastroenterology;  Laterality: N/A;   BASCILIC VEIN TRANSPOSITION Left 12/21/2018    Procedure: Left arm BASILIC VEIN TRANSPOSITION SECOND STAGE;  Surgeon: Cephus Shelling, MD;  Location: [facility] OR;  Service: Vascular;  Laterality: Left;   BIOPSY   12/29/2021    Procedure: BIOPSY;  Surgeon: Meryl Dare, MD;  Location: [facility] ENDOSCOPY;  Service: Gastroenterology;;    CHOLECYSTECTOMY N/A 06/30/2017    Procedure: LAPAROSCOPIC CHOLECYSTECTOMY WITH INTRAOPERATIVE CHOLANGIOGRAM;  Surgeon: Glenna Fellows, MD;  Location: [facility] ORS;  Service: General;  Laterality: N/A;   ESOPHAGOGASTRODUODENOSCOPY (EGD) WITH PROPOFOL N/A 07/04/2017    Procedure: ESOPHAGOGASTRODUODENOSCOPY (EGD) WITH PROPOFOL;  Surgeon: Vida Rigger, MD;  Location: [facility] ENDOSCOPY;  Service: Endoscopy;  Laterality: N/A;   ESOPHAGOGASTRODUODENOSCOPY (EGD) WITH PROPOFOL N/A 08/10/2020    Procedure: ESOPHAGOGASTRODUODENOSCOPY (EGD) WITH PROPOFOL;  Surgeon: Sherrilyn Rist, MD;  Location: [facility] ENDOSCOPY;  Service: Gastroenterology;  Laterality: N/A;   ESOPHAGOGASTRODUODENOSCOPY (EGD) WITH PROPOFOL N/A 01/19/2021    Procedure: ESOPHAGOGASTRODUODENOSCOPY (EGD) WITH PROPOFOL;  Surgeon: Iva Boop, MD;  Location: [facility] ENDOSCOPY;  Service: Endoscopy;  Laterality: N/A;  WITH FLUOROSCOPY AND DILATION   ESOPHAGOGASTRODUODENOSCOPY (EGD) WITH PROPOFOL N/A 02/03/2021    Procedure: ESOPHAGOGASTRODUODENOSCOPY (EGD) WITH PROPOFOL;  Surgeon: Iva Boop, MD;  Location: [facility] ENDOSCOPY;  Service: Endoscopy;  Laterality: N/A;   ESOPHAGOGASTRODUODENOSCOPY (EGD) WITH PROPOFOL N/A 02/10/2021    Procedure: ESOPHAGOGASTRODUODENOSCOPY (EGD) WITH PROPOFOL;  Surgeon: Hilarie Fredrickson, MD;  Location: [facility] ENDOSCOPY;  Service: Endoscopy;  Laterality: N/A;  Balloon Dilation   ESOPHAGOGASTRODUODENOSCOPY (EGD) WITH PROPOFOL N/A 02/19/2021    Procedure: ESOPHAGOGASTRODUODENOSCOPY (EGD) WITH PROPOFOL;  Surgeon: Imogene Burn, MD;  Location: [facility] ENDOSCOPY;  Service: Gastroenterology;  Laterality: N/A;   ESOPHAGOGASTRODUODENOSCOPY (EGD) WITH PROPOFOL N/A 03/12/2021    Procedure: ESOPHAGOGASTRODUODENOSCOPY (EGD) WITH PROPOFOL;  Surgeon: Shellia Cleverly, DO;  Location: [facility] ENDOSCOPY;  Service: Gastroenterology;  Laterality: N/A;   ESOPHAGOGASTRODUODENOSCOPY (EGD) WITH PROPOFOL N/A 12/29/2021    Procedure: ESOPHAGOGASTRODUODENOSCOPY (EGD) WITH  PROPOFOL;  Surgeon: Meryl Dare, MD;  Location: [facility] ENDOSCOPY;  Service: Gastroenterology;  Laterality: N/A;   FLEXIBLE SIGMOIDOSCOPY N/A 12/29/2021    Procedure: FLEXIBLE SIGMOIDOSCOPY;  Surgeon: Meryl Dare, MD;  Location: [facility] ENDOSCOPY;  Service: Gastroenterology;  Laterality: N/A;   FOREIGN BODY REMOVAL N/A 02/03/2021    Procedure: FOREIGN BODY REMOVAL;  Surgeon: Iva Boop, MD;  Location: [facility] ENDOSCOPY;  Service: Endoscopy;  Laterality: N/A;   INSERTION OF DIALYSIS CATHETER N/A 03/20/2018    Procedure: INSERTION OF TUNNELED DIALYSIS CATHETER - RIGHT INTERANL JUGULAR PLACEMENT;  Surgeon: Chuck Hint, MD;  Location: [facility] OR;  Service: Vascular;  Laterality: N/A;   IR FLUORO GUIDE CV LINE RIGHT   04/18/2020   IR GUIDED DRAIN W CATHETER PLACEMENT   10/28/2018   KIDNEY TRANSPLANT   2007   KIDNEY TRANSPLANT Right     PARATHYROIDECTOMY   ?2012    "3/4 removed" (10/24/2017)   RENAL BIOPSY Bilateral 2003   REVISON OF ARTERIOVENOUS FISTULA Left 09/24/2020  Procedure: LEFT UPPER ARM ARTERIOVENOUS GRAFT CREATION;  Surgeon: Cephus Shelling, MD;  Location: [facility] OR;  Service: Vascular;  Laterality: Left;   UPPER EXTREMITY VENOGRAPHY Bilateral 10/19/2018    Procedure: UPPER EXTREMITY VENOGRAPHY;  Surgeon: Cephus Shelling, MD;  Location: [facility] INVASIVE CV LAB;  Service: Cardiovascular;  Laterality: Bilateral;  Bilateral     UPPER EXTREMITY VENOGRAPHY Left 09/04/2020    Procedure: UPPER EXTREMITY VENOGRAPHY - Left Upper;  Surgeon: Cephus Shelling, MD;  Location: [facility] INVASIVE CV LAB;  Service: Cardiovascular;  Laterality: Left;               Family History  Problem Relation Age of Onset   Arthritis Mother     Hypertension Mother     Aneurysm Mother          died of brain aneurysm   CAD Father          Has 3 stents   Diabetes Father          borderline   Early death Brother          Died in war   Colon cancer Neg Hx     Esophageal cancer Neg Hx     Rectal cancer Neg Hx      Stomach cancer Neg Hx            SOCIAL HISTORY: Social History         Tobacco Use   Smoking status: Never      Passive exposure: Never   Smokeless tobacco: Never  Substance Use Topics   Alcohol use: Not Currently      Allergies       Allergies  Allergen Reactions   Diphenhydramine Anaphylaxis   Motrin [Ibuprofen] Shortness Of Breath and Itching   Peanut-Containing Drug Products Itching and Hives      Able to tolerate when cooked in foods   Contrast Media [Iodinated Contrast Media] Itching   Shellfish Allergy Hives   Banana Itching, Nausea And Vomiting and Other (See Comments)      Sick on the stomach   Chlorhexidine Itching   Ferrous Sulfate Itching   Iron Dextran Itching and Other (See Comments)      Vein irritation                Current Outpatient Medications  Medication Sig Dispense Refill   acetaminophen (TYLENOL) 500 MG tablet Take 500-1,000 mg by mouth every 6 (six) hours as needed for moderate pain.       Calcium Carbonate Antacid (CALCIUM CARBONATE, DOSED IN MG ELEMENTAL CALCIUM,) 1250 MG/5ML SUSP Take 5 mLs by mouth daily.       fluticasone (FLONASE) 50 MCG/ACT nasal spray Place 2 sprays into both nostrils daily as needed for allergies.       gabapentin (NEURONTIN) 600 MG tablet Take 600 mg by mouth 3 (three) times daily.       HYDROcodone-acetaminophen (NORCO) 5-325 MG tablet Take 1 tablet by mouth every 6 (six) hours as needed. 30 tablet 0   hydrOXYzine (ATARAX) 25 MG tablet Take 25-50 mg by mouth 2 (two) times daily as needed.       insulin aspart (NOVOLOG FLEXPEN) 100 UNIT/ML FlexPen Inject 0-15 Units into the skin as needed for high blood sugar. Sliding Scale insulin       insulin degludec (TRESIBA) 100 UNIT/ML FlexTouch Pen Inject 5-10 Units into the skin daily. (Patient taking differently: Inject 7 Units into the skin daily.)  lidocaine-prilocaine (EMLA) cream Apply 1 application topically daily as needed [facility]).       loperamide (IMODIUM)  2 MG capsule Take 2 mg by mouth as needed for diarrhea or loose stools.       metoprolol tartrate (LOPRESSOR) 25 MG tablet Take 1 tablet (25 mg total) by mouth daily as needed (for systolic blood pressure or 135 or above). (Patient taking differently: Take 25 mg by mouth daily.)       omeprazole (PRILOSEC) 40 MG capsule Take 1 capsule (40 mg total) by mouth 2 (two) times daily before a meal. Open capsule and place granules in water or applesauce to swallow 180 capsule 3   ondansetron (ZOFRAN-ODT) 4 MG disintegrating tablet Take 1 tablet (4 mg total) by mouth every 8 (eight) hours as needed for nausea or vomiting. 6 tablet 0   oxyCODONE-acetaminophen (PERCOCET/ROXICET) 5-325 MG tablet Take 1 tablet by mouth daily as needed for severe pain. 5 tablet 0   promethazine (PHENERGAN) 25 MG tablet Take 1 tablet (25 mg total) by mouth every 6 (six) hours as needed for nausea or vomiting. 30 tablet 0   RENVELA 2.4 g PACK Take 2.4 g by mouth 3 (three) times daily.       simvastatin (ZOCOR) 20 MG tablet Take 20 mg by mouth every 3 (three) days. Monday, Wednesday, Friday       tiZANidine (ZANAFLEX) 2 MG tablet Take 2 mg by mouth 2 (two) times daily as needed.       dicyclomine (BENTYL) 20 MG tablet Take 1 tablet (20 mg total) by mouth 2 (two) times daily for 5 days. 10 tablet 0      No current facility-administered medications for this visit.        REVIEW OF SYSTEMS:  [X]  denotes positive finding, [ ]  denotes negative finding Cardiac   Comments:  Chest pain or chest pressure:      Shortness of breath upon exertion:      Short of breath when lying flat:      Irregular heart rhythm:             Vascular      Pain in calf, thigh, or hip brought on by ambulation:      Pain in feet at night that wakes you up from your sleep:       Blood clot in your veins:      Leg swelling:              Pulmonary      Oxygen at home:      Productive cough:       Wheezing:              Neurologic      Sudden  weakness in arms or legs:       Sudden numbness in arms or legs:       Sudden onset of difficulty speaking or slurred speech:      Temporary loss of vision in one eye:       Problems with dizziness:              Gastrointestinal      Blood in stool:       Vomited blood:              Genitourinary      Burning when urinating:       Blood in urine:             Psychiatric  Major depression:              Hematologic      Bleeding problems:      Problems with blood clotting too easily:             Skin      Rashes or ulcers:             Constitutional      Fever or chills:          PHYSICAL EXAM: There were no vitals filed for this visit.   GENERAL: The patient is a well-nourished female, in no acute distress. The vital signs are documented above. CARDIAC: There is a regular rate and rhythm.  VASCULAR: Left upper arm AV graft pulsatile PULMONARY: No respiratory distress. ABDOMEN: Soft and non-tender.     DATA:    Duplex shows left upper arm AV graft is patent with nonocclusive thrombus in the outflow   Assessment/Plan:   37 y.o. female with history end-stage renal disease, DM, HTN, HLD who presents for evaluation of her left arm AV graft.  Most recently underwent a redo left arm AV graft on 09/24/2020.This has been working well until recently.  She had a left arm fistulogram with [facility] per the report the graft was patent but the brachial vein was occluded which is the outflow for the graft.   I have recommended a left arm fistulogram to further evaluate her options.  I do not have any images from [facility].  Unclear if I could try an endovascular attempt to get her graft open versus a surgical revision.  Risk benefits discussed.  Will get scheduled for Thursday.     Cephus Shelling, MD [facility] [place] Office: [phone]

## 2023-02-25 ENCOUNTER — Telehealth: Payer: Self-pay

## 2023-02-25 ENCOUNTER — Other Ambulatory Visit: Payer: Self-pay

## 2023-02-25 ENCOUNTER — Encounter (HOSPITAL_COMMUNITY): Payer: Self-pay | Admitting: Vascular Surgery

## 2023-02-25 DIAGNOSIS — N186 End stage renal disease: Secondary | ICD-10-CM

## 2023-02-25 NOTE — Telephone Encounter (Signed)
Attempted to reach pt to schedule her AVG revision. Call kept getting disconnected;unable to leave a VM.

## 2023-03-10 NOTE — Progress Notes (Signed)
OR Front desk made aware of patient being in hospital at [facility] and receiving new kidney on 03-03-23.

## 2023-03-10 NOTE — Progress Notes (Addendum)
Just spoke with patient regarding pre admission testing questions for 03-14-23. Patient states she "got her new kidney 03-04-23 reports getting is doing well" also states she may be "discharged tomorrow 03-11-23" message left at Dr. Ophelia Charter office.

## 2023-03-11 ENCOUNTER — Telehealth: Payer: Self-pay

## 2023-03-11 NOTE — Telephone Encounter (Signed)
Received telephone voicemail from [facility]- PAT nurse, Garner Gavel. that patient received a kidney transplant on 12/20 in [place] with an expected discharge home on today, so requesting to cancel surgery on 03/14/23 for revision of AVG.

## 2023-03-14 ENCOUNTER — Encounter (HOSPITAL_COMMUNITY): Admission: RE | Payer: Self-pay | Source: Home / Self Care

## 2023-03-14 ENCOUNTER — Ambulatory Visit (HOSPITAL_COMMUNITY): Admission: RE | Admit: 2023-03-14 | Payer: 59 | Source: Home / Self Care | Admitting: Vascular Surgery

## 2023-03-14 ENCOUNTER — Encounter (HOSPITAL_COMMUNITY): Payer: Self-pay | Admitting: Physician Assistant

## 2023-03-14 SURGERY — REVISION OF ARTERIOVENOUS GORETEX GRAFT
Anesthesia: Choice | Laterality: Left

## 2023-05-07 ENCOUNTER — Other Ambulatory Visit: Payer: Self-pay

## 2023-05-07 ENCOUNTER — Encounter (HOSPITAL_COMMUNITY): Payer: Self-pay | Admitting: Emergency Medicine

## 2023-05-07 ENCOUNTER — Other Ambulatory Visit (HOSPITAL_COMMUNITY): Payer: 59

## 2023-05-07 ENCOUNTER — Inpatient Hospital Stay (HOSPITAL_COMMUNITY)
Admission: EM | Admit: 2023-05-07 | Discharge: 2023-05-08 | DRG: 871 | Disposition: A | Discharge status: Other Acute Inpatient Hospital | Payer: 59 | Attending: Pulmonary Disease | Admitting: Pulmonary Disease

## 2023-05-07 ENCOUNTER — Emergency Department (HOSPITAL_COMMUNITY): Payer: 59

## 2023-05-07 DIAGNOSIS — Z91041 Radiographic dye allergy status: Secondary | ICD-10-CM

## 2023-05-07 DIAGNOSIS — N170 Acute kidney failure with tubular necrosis: Secondary | ICD-10-CM | POA: Diagnosis present

## 2023-05-07 DIAGNOSIS — N179 Acute kidney failure, unspecified: Secondary | ICD-10-CM

## 2023-05-07 DIAGNOSIS — Z91010 Allergy to peanuts: Secondary | ICD-10-CM

## 2023-05-07 DIAGNOSIS — Z794 Long term (current) use of insulin: Secondary | ICD-10-CM

## 2023-05-07 DIAGNOSIS — Z86718 Personal history of other venous thrombosis and embolism: Secondary | ICD-10-CM

## 2023-05-07 DIAGNOSIS — N12 Tubulo-interstitial nephritis, not specified as acute or chronic: Secondary | ICD-10-CM | POA: Diagnosis present

## 2023-05-07 DIAGNOSIS — N186 End stage renal disease: Secondary | ICD-10-CM | POA: Diagnosis present

## 2023-05-07 DIAGNOSIS — Z91013 Allergy to seafood: Secondary | ICD-10-CM

## 2023-05-07 DIAGNOSIS — R6521 Severe sepsis with septic shock: Secondary | ICD-10-CM | POA: Diagnosis present

## 2023-05-07 DIAGNOSIS — Z888 Allergy status to other drugs, medicaments and biological substances status: Secondary | ICD-10-CM

## 2023-05-07 DIAGNOSIS — Z940 Kidney transplant status: Secondary | ICD-10-CM

## 2023-05-07 DIAGNOSIS — A419 Sepsis, unspecified organism: Secondary | ICD-10-CM | POA: Diagnosis not present

## 2023-05-07 DIAGNOSIS — I120 Hypertensive chronic kidney disease with stage 5 chronic kidney disease or end stage renal disease: Secondary | ICD-10-CM | POA: Diagnosis present

## 2023-05-07 DIAGNOSIS — E1122 Type 2 diabetes mellitus with diabetic chronic kidney disease: Secondary | ICD-10-CM | POA: Diagnosis present

## 2023-05-07 DIAGNOSIS — Z905 Acquired absence of kidney: Secondary | ICD-10-CM

## 2023-05-07 DIAGNOSIS — E872 Acidosis, unspecified: Secondary | ICD-10-CM | POA: Diagnosis present

## 2023-05-07 DIAGNOSIS — Y830 Surgical operation with transplant of whole organ as the cause of abnormal reaction of the patient, or of later complication, without mention of misadventure at the time of the procedure: Secondary | ICD-10-CM | POA: Diagnosis present

## 2023-05-07 DIAGNOSIS — E785 Hyperlipidemia, unspecified: Secondary | ICD-10-CM | POA: Diagnosis present

## 2023-05-07 DIAGNOSIS — Z79899 Other long term (current) drug therapy: Secondary | ICD-10-CM

## 2023-05-07 DIAGNOSIS — N3080 Other cystitis without hematuria: Secondary | ICD-10-CM | POA: Diagnosis present

## 2023-05-07 DIAGNOSIS — T8619 Other complication of kidney transplant: Secondary | ICD-10-CM | POA: Diagnosis present

## 2023-05-07 LAB — PREGNANCY, URINE: Preg Test, Ur: NEGATIVE

## 2023-05-07 LAB — URINALYSIS, W/ REFLEX TO CULTURE (INFECTION SUSPECTED)
Bilirubin Urine: NEGATIVE
Glucose, UA: >=500 mg/dL — AB
Ketones, ur: NEGATIVE mg/dL
Nitrite: NEGATIVE
Protein, ur: 100 mg/dL — AB
Specific Gravity, Urine: 1.017 (ref 1.005–1.030)
pH: 5 (ref 5.0–8.0)

## 2023-05-07 LAB — CBC WITH DIFFERENTIAL/PLATELET
Abs Immature Granulocytes: 0.2 10*3/uL — ABNORMAL HIGH (ref 0.00–0.07)
Basophils Absolute: 0.1 10*3/uL (ref 0.0–0.1)
Basophils Relative: 1 %
Eosinophils Absolute: 0.1 10*3/uL (ref 0.0–0.5)
Eosinophils Relative: 0 %
HCT: 37.5 % (ref 36.0–46.0)
Hemoglobin: 11.6 g/dL — ABNORMAL LOW (ref 12.0–15.0)
Immature Granulocytes: 1 %
Lymphocytes Relative: 1 %
Lymphs Abs: 0.2 10*3/uL — ABNORMAL LOW (ref 0.7–4.0)
MCH: 28.4 pg (ref 26.0–34.0)
MCHC: 30.9 g/dL (ref 30.0–36.0)
MCV: 91.7 fL (ref 80.0–100.0)
Monocytes Absolute: 0.6 10*3/uL (ref 0.1–1.0)
Monocytes Relative: 4 %
Neutro Abs: 14.9 10*3/uL — ABNORMAL HIGH (ref 1.7–7.7)
Neutrophils Relative %: 93 %
Platelets: 156 10*3/uL (ref 150–400)
RBC: 4.09 MIL/uL (ref 3.87–5.11)
RDW: 14.6 % (ref 11.5–15.5)
WBC: 16.1 10*3/uL — ABNORMAL HIGH (ref 4.0–10.5)
nRBC: 0 % (ref 0.0–0.2)

## 2023-05-07 LAB — COMPREHENSIVE METABOLIC PANEL
ALT: 16 U/L (ref 0–44)
AST: 24 U/L (ref 15–41)
Albumin: 3.6 g/dL (ref 3.5–5.0)
Alkaline Phosphatase: 64 U/L (ref 38–126)
Anion gap: 8 (ref 5–15)
BUN: 30 mg/dL — ABNORMAL HIGH (ref 6–20)
CO2: 22 mmol/L (ref 22–32)
Calcium: 8.4 mg/dL — ABNORMAL LOW (ref 8.9–10.3)
Chloride: 106 mmol/L (ref 98–111)
Creatinine, Ser: 1.9 mg/dL — ABNORMAL HIGH (ref 0.44–1.00)
GFR, Estimated: 34 mL/min — ABNORMAL LOW (ref >60)
Glucose, Bld: 278 mg/dL — ABNORMAL HIGH (ref 70–99)
Potassium: 4.7 mmol/L (ref 3.5–5.1)
Sodium: 136 mmol/L (ref 135–145)
Total Bilirubin: 0.8 mg/dL (ref 0.0–1.2)
Total Protein: 7.4 g/dL (ref 6.5–8.1)

## 2023-05-07 LAB — I-STAT CG4 LACTIC ACID, ED: Lactic Acid, Venous: 2.1 mmol/L (ref 0.5–1.9)

## 2023-05-07 LAB — RESP PANEL BY RT-PCR (RSV, FLU A&B, COVID)  RVPGX2
Influenza A by PCR: NEGATIVE
Influenza B by PCR: NEGATIVE
Resp Syncytial Virus by PCR: NEGATIVE
SARS Coronavirus 2 by RT PCR: NEGATIVE

## 2023-05-07 MED ORDER — ACETAMINOPHEN 325 MG PO TABS
650.0000 mg | ORAL_TABLET | Freq: Once | ORAL | Status: AC
  Administered 2023-05-07: 650 mg via ORAL
  Filled 2023-05-07: qty 2

## 2023-05-07 MED ORDER — ONDANSETRON HCL 4 MG/2ML IJ SOLN
4.0000 mg | Freq: Once | INTRAMUSCULAR | Status: AC
  Administered 2023-05-07: 4 mg via INTRAVENOUS
  Filled 2023-05-07: qty 2

## 2023-05-07 MED ORDER — LACTATED RINGERS IV BOLUS (SEPSIS)
1000.0000 mL | Freq: Once | INTRAVENOUS | Status: AC
  Administered 2023-05-07: 1000 mL via INTRAVENOUS

## 2023-05-07 MED ORDER — SODIUM CHLORIDE 0.9 % IV SOLN
2.0000 g | Freq: Once | INTRAVENOUS | Status: AC
  Administered 2023-05-07: 2 g via INTRAVENOUS
  Filled 2023-05-07: qty 12.5

## 2023-05-07 MED ORDER — MORPHINE SULFATE (PF) 4 MG/ML IV SOLN
4.0000 mg | Freq: Once | INTRAVENOUS | Status: AC
  Administered 2023-05-07: 4 mg via INTRAVENOUS
  Filled 2023-05-07: qty 1

## 2023-05-07 MED ORDER — LACTATED RINGERS IV BOLUS
1000.0000 mL | Freq: Once | INTRAVENOUS | Status: AC
  Administered 2023-05-08: 1000 mL via INTRAVENOUS

## 2023-05-07 NOTE — ED Triage Notes (Signed)
 Patient bib [facility] from home for abdominal pain and fevers x2 days. Patient reports cloudy urine and pain with urination. Hx of kidney transplant in December 2024. Endorses vomiting with inability to keep medication down.   BP 126/88 HR 140 SPO2 94% RA  CBG 315

## 2023-05-07 NOTE — ED Notes (Signed)
IV team at bedside now

## 2023-05-07 NOTE — ED Provider Notes (Signed)
 [place] EMERGENCY DEPARTMENT AT [facility] Provider Note   CSN: [id number] Arrival date & time: 05/07/23  2009    History  Chief Complaint  Patient presents with   Abdominal Pain    [name] is a 38 y.o. female ESRD, recent transplant, diabetes here for evaluation of feeling unwell.  2 days ago started with persistent NBNB emesis and some lower abdominal cramping.  She took a muscle relaxer without relief.  She states she has had difficulty with urination.  Has been unable to keep down her home meds.  Also noted to have some pain at her incision site from her recent transplant.  Was on antibiotics previously for abscess to this area.  She follows with [facility] for recent transplant.  Her brother and his children are sick however they do not live with her.  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 No CP, SOB, HA, congestion. Mild cough.  HPI     Home Medications Prior to Admission medications   Medication Sig Start Date End Date Taking? Authorizing Provider  acetaminophen (TYLENOL) 500 MG tablet Take 500-1,000 mg by mouth every 6 (six) hours as needed for moderate pain.    [provider]  Calcium Carbonate Antacid (CALCIUM CARBONATE, DOSED IN MG ELEMENTAL CALCIUM,) 1250 MG/5ML SUSP Take 5 mLs by mouth 2 (two) times daily. 10/22/20   [provider]  diclofenac Sodium (VOLTAREN) 1 % GEL Apply topically 4 (four) times daily.    [provider]  esomeprazole (NEXIUM) 40 MG capsule Take 40 mg by mouth 2 (two) times daily before a meal. 30 min before meals    [provider]  famotidine (PEPCID) 40 MG tablet Take 40 mg by mouth daily.    [provider]  fluticasone (FLONASE) 50 MCG/ACT nasal spray Place 2 sprays into both nostrils daily as needed for allergies. 09/03/18   Glade Lloyd, MD  HYDROcodone-acetaminophen (NORCO) 5-325 MG tablet Take 1 tablet by mouth every 6 (six) hours as needed. 04/09/22   Adonis Huguenin, NP   hydrOXYzine (ATARAX) 25 MG tablet Take 25-50 mg by mouth 2 (two) times daily as needed for itching. 11/24/22   [provider]  insulin aspart (NOVOLOG FLEXPEN) 100 UNIT/ML FlexPen Inject 1-10 Units into the skin 3 (three) times daily with meals. Sliding Scale    [provider]  insulin degludec (TRESIBA) 100 UNIT/ML FlexTouch Pen Inject 5-10 Units into the skin daily. Patient taking differently: Inject 3-7 Units into the skin daily. Sliding scale Less the 300 take 3 units over 300 take 7 units 01/01/22   Lurene Shadow, MD  lidocaine-prilocaine (EMLA) cream Apply 1 application topically daily as needed [facility]). 12/17/20   [provider]  loperamide (IMODIUM) 2 MG capsule Take 2 mg by mouth as needed for diarrhea or loose stools. 09/11/20   [provider]  metoprolol tartrate (LOPRESSOR) 25 MG tablet Take 1 tablet (25 mg total) by mouth daily as needed (for systolic blood pressure or 135 or above). Patient taking differently: Take 25 mg by mouth 2 (two) times daily. 01/01/22   Lurene Shadow, MD  midodrine (PROAMATINE) 5 MG tablet Take 5 mg by mouth as needed (low blood pressure during dialysis).    [provider]  omeprazole (PRILOSEC) 40 MG capsule Take 1 capsule (40 mg total) by mouth 2 (two) times daily before a meal. Open capsule and place granules in water or applesauce to swallow 03/17/21   Leone Payor,  Maryjean Morn, MD  ondansetron (ZOFRAN-ODT) 4 MG disintegrating tablet Take 1 tablet (4 mg total) by mouth every 8 (eight) hours as needed for nausea or vomiting. 12/31/22   Jeannie Fend, PA-C  pregabalin (LYRICA) 75 MG capsule Take 75 mg by mouth 3 (three) times daily.    [provider]  promethazine (PHENERGAN) 25 MG tablet Take 1 tablet (25 mg total) by mouth every 6 (six) hours as needed for nausea or vomiting. 12/19/22   Sabas Sous, MD  sevelamer (RENAGEL) 800 MG tablet Take 800 mg by mouth 3 (three) times daily with meals. Dissolve in [employer]     [provider]  simvastatin (ZOCOR) 20 MG tablet Take 20 mg by mouth at bedtime.    [provider]  sucralfate (CARAFATE) 1 g tablet Take 1 g by mouth 2 (two) times daily as needed (esophagus pain).    [provider]  tiZANidine (ZANAFLEX) 2 MG tablet Take 2 mg by mouth 2 (two) times daily as needed. 10/28/22   [provider]      Allergies    Diphenhydramine, Peanut-containing drug products, Contrast media [iodinated contrast media], Shellfish allergy, Chlorhexidine, Ferrous sulfate, and Iron dextran    Review of Systems   Review of Systems  Constitutional:  Positive for activity change, appetite change, fatigue and fever.  HENT: Negative.    Respiratory:  Positive for cough. Negative for apnea, choking, chest tightness, shortness of breath, wheezing and stridor.   Cardiovascular: Negative.   Gastrointestinal:  Positive for abdominal pain, nausea and vomiting. Negative for diarrhea and rectal pain.  Genitourinary:  Positive for difficulty urinating and dysuria.  Musculoskeletal: Negative.   Skin:  Positive for wound.  Neurological:  Positive for weakness (generalized).  All other systems reviewed and are negative.   Physical Exam Updated Vital Signs BP 111/81   Pulse (!) 126   Temp (!) 102.4 F (39.1 C) (Oral)   Resp (!) 24   Ht 5\' 6"  (1.676 m)   Wt 81.6 kg   SpO2 95%   BMI 29.05 kg/m  Physical Exam Vitals and nursing note reviewed.  Constitutional:      General: She is not in acute distress.    Appearance: She is well-developed. She is ill-appearing and toxic-appearing. She is not diaphoretic.  HENT:     Head: Atraumatic.  Eyes:     Pupils: Pupils are equal, round, and reactive to light.  Cardiovascular:     Rate and Rhythm: Tachycardia present.     Heart sounds: Normal heart sounds.  Pulmonary:     Effort: Pulmonary effort is normal. No respiratory distress.     Breath sounds: Normal breath sounds.     Comments: Clear bil,  speaks in full sentences without difficulty Abdominal:     General: Bowel sounds are normal. There is no distension.     Palpations: Abdomen is soft.     Tenderness: There is abdominal tenderness.     Comments: Soft, diffuse tenderness to right abdomen and bilateral flanks.  Tenderness with some induration without fluctuance to left incision with scabbed wound.  Musculoskeletal:        General: Normal range of motion.     Cervical back: Normal range of motion.  Skin:    General: Skin is warm and dry.  Neurological:     General: No focal deficit present.     Mental Status: She is alert.  Psychiatric:        Mood and Affect:  Mood normal.    ED Results / Procedures / Treatments   Labs (all labs ordered are listed, but only abnormal results are displayed) Labs Reviewed  COMPREHENSIVE METABOLIC PANEL - Abnormal; Notable for the following components:      Result Value   Glucose, Bld 278 (*)    BUN 30 (*)    Creatinine, Ser 1.90 (*)    Calcium 8.4 (*)    GFR, Estimated 34 (*)    All other components within normal limits  CBC WITH DIFFERENTIAL/PLATELET - Abnormal; Notable for the following components:   WBC 16.1 (*)    Hemoglobin 11.6 (*)    Neutro Abs 14.9 (*)    Lymphs Abs 0.2 (*)    Abs Immature Granulocytes 0.20 (*)    All other components within normal limits  I-STAT CG4 LACTIC ACID, ED - Abnormal; Notable for the following components:   Lactic Acid, Venous 2.1 (*)    All other components within normal limits  RESP PANEL BY RT-PCR (RSV, FLU A&B, COVID)  RVPGX2  CULTURE, BLOOD (ROUTINE X 2)  CULTURE, BLOOD (ROUTINE X 2)  PREGNANCY, URINE  URINALYSIS, W/ REFLEX TO CULTURE (INFECTION SUSPECTED)  PROTIME-INR  APTT  HCG, SERUM, QUALITATIVE  I-STAT CG4 LACTIC ACID, ED    EKG EKG Interpretation Date/Time:  Saturday May 07 2023 20:21:31 EST Ventricular Rate:  140 PR Interval:  120 QRS Duration:  78 QT Interval:  282 QTC Calculation: 430 R Axis:   94  Text  Interpretation: Sinus tachycardia Possible Left atrial enlargement Rightward axis Borderline ECG When compared with ECG of 31-Dec-2022 02:53, PREVIOUS ECG IS PRESENT Rate faster Confirmed by Glynn Octave [phone]) on 05/07/2023 11:12:22 PM  Radiology DG Chest Portable 1 View Result Date: 05/07/2023 CLINICAL DATA:  Sepsis EXAM: PORTABLE CHEST 1 VIEW COMPARISON:  12/21/2022 FINDINGS: Heart and mediastinal contours are within normal limits. No focal opacities or effusions. No acute bony abnormality. IMPRESSION: No active disease. Electronically Signed   By: Charlett Nose M.D.   On: 05/07/2023 21:13    Procedures .Critical Care  Performed by: Linwood Dibbles, PA-C Authorized by: Linwood Dibbles, PA-C   Critical care provider statement:    Critical care time (minutes):  35   Critical care was time spent personally by me on the following activities:  Development of treatment plan with patient or surrogate, discussions with consultants, evaluation of patient's response to treatment, examination of patient, ordering and review of laboratory studies, ordering and review of radiographic studies, ordering and performing treatments and interventions, pulse oximetry, re-evaluation of patient's condition and review of old charts     Medications Ordered in ED Medications  lactated ringers bolus 1,000 mL (1,000 mLs Intravenous New Bag/Given 05/07/23 2218)  ceFEPIme (MAXIPIME) 2 g in sodium chloride 0.9 % 100 mL IVPB (2 g Intravenous New Bag/Given 05/07/23 2219)  acetaminophen (TYLENOL) tablet 650 mg (650 mg Oral Given 05/07/23 2219)  ondansetron (ZOFRAN) injection 4 mg (4 mg Intravenous Given 05/07/23 2219)  morphine (PF) 4 MG/ML injection 4 mg (4 mg Intravenous Given 05/07/23 2220)   ED Course/ Medical Decision Making/ A&P Clinical Course as of 05/07/23 2331  Sat May 07, 2023  2255 Creatinine(!): 1.90 [LS]    Clinical Course User Index [LS] Meade Maw, Student-PA   38 year old complex  medical history including recent renal transplant end of December at [facility] here for evaluation of feeling unwell.  On arrival patient febrile, tachycardic, tachypneic.  She has urinary symptoms.  Code sepsis called.  She also notes some tenderness overlying her incision site to her left abdomen she states she previously had abscess in the area was on antibiotics.  Unable to keep down her home meds.  Given her recent hospitalization, renal transplant will give cefepime for possible complicated pyelonephritis.  She does note some sick family members and her brother.  She does have a mild cough.  Plan broad workup, sepsis.  Labs and imaging personally viewed and interpreted:  Chest x-ray without cardiomegaly, pulm edema, pneumothorax Lactic 2.1 CBC leukocytosis 16.1, hemoglobin 11.6 Viral panel negative Metabolic panel creatinine 1.9, most recent 1.4 at [facility] neg   Care transferred to [facility] who will FU on labs and imaging. Anticipate admission for sepsis, consult with transplant center, [facility]                                 Medical Decision Making Amount and/or Complexity of Data Reviewed Independent Historian: EMS External Data Reviewed: labs, radiology, ECG and notes. Labs: ordered. Decision-making details documented in ED Course. Radiology: ordered and independent interpretation performed. Decision-making details documented in ED Course. ECG/medicine tests: ordered and independent interpretation performed. Decision-making details documented in ED Course.  Risk OTC drugs. Prescription drug management. Parenteral controlled substances. Decision regarding hospitalization. Diagnosis or treatment significantly limited by social determinants of health.          Final Clinical Impression(s) / ED Diagnoses Final diagnoses:  Sepsis with acute renal failure without septic shock, due to unspecified organism, unspecified acute renal failure type [facility])  Renal transplant recipient   AKI (acute kidney injury) [facility])    Rx / DC Orders ED Discharge Orders     None         Nohemy Koop A, PA-C 05/07/23 2331    Pricilla Loveless, MD 05/11/23 1103

## 2023-05-07 NOTE — Progress Notes (Signed)
 Pt being followed by [facility] for Sepsis protocol.

## 2023-05-07 NOTE — ED Notes (Signed)
 Significant delay in sepsis fluid/antibiotic administration due to access difficulty. IV team was able to establish access, but line does not pull back well, RN unable to collect blood cultures at this time. Antibiotic and fluid administration begun. MD/PA aware.

## 2023-05-08 ENCOUNTER — Emergency Department (HOSPITAL_COMMUNITY): Payer: 59

## 2023-05-08 ENCOUNTER — Other Ambulatory Visit (HOSPITAL_COMMUNITY): Payer: 59

## 2023-05-08 DIAGNOSIS — Z91010 Allergy to peanuts: Secondary | ICD-10-CM | POA: Diagnosis not present

## 2023-05-08 DIAGNOSIS — R6521 Severe sepsis with septic shock: Secondary | ICD-10-CM

## 2023-05-08 DIAGNOSIS — Y830 Surgical operation with transplant of whole organ as the cause of abnormal reaction of the patient, or of later complication, without mention of misadventure at the time of the procedure: Secondary | ICD-10-CM | POA: Diagnosis present

## 2023-05-08 DIAGNOSIS — N179 Acute kidney failure, unspecified: Secondary | ICD-10-CM | POA: Diagnosis present

## 2023-05-08 DIAGNOSIS — Z91041 Radiographic dye allergy status: Secondary | ICD-10-CM | POA: Diagnosis not present

## 2023-05-08 DIAGNOSIS — Z86718 Personal history of other venous thrombosis and embolism: Secondary | ICD-10-CM | POA: Diagnosis not present

## 2023-05-08 DIAGNOSIS — I120 Hypertensive chronic kidney disease with stage 5 chronic kidney disease or end stage renal disease: Secondary | ICD-10-CM | POA: Diagnosis present

## 2023-05-08 DIAGNOSIS — N3080 Other cystitis without hematuria: Secondary | ICD-10-CM | POA: Diagnosis present

## 2023-05-08 DIAGNOSIS — N3090 Cystitis, unspecified without hematuria: Secondary | ICD-10-CM | POA: Diagnosis not present

## 2023-05-08 DIAGNOSIS — E872 Acidosis, unspecified: Secondary | ICD-10-CM | POA: Diagnosis present

## 2023-05-08 DIAGNOSIS — N186 End stage renal disease: Secondary | ICD-10-CM | POA: Diagnosis present

## 2023-05-08 DIAGNOSIS — T8619 Other complication of kidney transplant: Secondary | ICD-10-CM | POA: Diagnosis present

## 2023-05-08 DIAGNOSIS — Z79899 Other long term (current) drug therapy: Secondary | ICD-10-CM | POA: Diagnosis not present

## 2023-05-08 DIAGNOSIS — A419 Sepsis, unspecified organism: Secondary | ICD-10-CM | POA: Diagnosis present

## 2023-05-08 DIAGNOSIS — Z888 Allergy status to other drugs, medicaments and biological substances status: Secondary | ICD-10-CM | POA: Diagnosis not present

## 2023-05-08 DIAGNOSIS — N12 Tubulo-interstitial nephritis, not specified as acute or chronic: Secondary | ICD-10-CM | POA: Diagnosis present

## 2023-05-08 DIAGNOSIS — Z794 Long term (current) use of insulin: Secondary | ICD-10-CM | POA: Diagnosis not present

## 2023-05-08 DIAGNOSIS — E785 Hyperlipidemia, unspecified: Secondary | ICD-10-CM | POA: Diagnosis present

## 2023-05-08 DIAGNOSIS — N170 Acute kidney failure with tubular necrosis: Secondary | ICD-10-CM | POA: Diagnosis present

## 2023-05-08 DIAGNOSIS — Z91013 Allergy to seafood: Secondary | ICD-10-CM | POA: Diagnosis not present

## 2023-05-08 DIAGNOSIS — E1122 Type 2 diabetes mellitus with diabetic chronic kidney disease: Secondary | ICD-10-CM | POA: Diagnosis present

## 2023-05-08 DIAGNOSIS — Z905 Acquired absence of kidney: Secondary | ICD-10-CM | POA: Diagnosis not present

## 2023-05-08 LAB — CBC WITH DIFFERENTIAL/PLATELET
Abs Immature Granulocytes: 0 10*3/uL (ref 0.00–0.07)
Basophils Absolute: 0 10*3/uL (ref 0.0–0.1)
Basophils Relative: 0 %
Eosinophils Absolute: 0 10*3/uL (ref 0.0–0.5)
Eosinophils Relative: 0 %
HCT: 31.1 % — ABNORMAL LOW (ref 36.0–46.0)
Hemoglobin: 9.5 g/dL — ABNORMAL LOW (ref 12.0–15.0)
Lymphocytes Relative: 1 %
Lymphs Abs: 0.1 10*3/uL — ABNORMAL LOW (ref 0.7–4.0)
MCH: 29.1 pg (ref 26.0–34.0)
MCHC: 30.5 g/dL (ref 30.0–36.0)
MCV: 95.4 fL (ref 80.0–100.0)
Monocytes Absolute: 0.1 10*3/uL (ref 0.1–1.0)
Monocytes Relative: 1 %
Neutro Abs: 14.5 10*3/uL — ABNORMAL HIGH (ref 1.7–7.7)
Neutrophils Relative %: 98 %
Platelets: 141 10*3/uL — ABNORMAL LOW (ref 150–400)
RBC: 3.26 MIL/uL — ABNORMAL LOW (ref 3.87–5.11)
RDW: 14.8 % (ref 11.5–15.5)
WBC: 14.8 10*3/uL — ABNORMAL HIGH (ref 4.0–10.5)
nRBC: 0 % (ref 0.0–0.2)
nRBC: 0 /100{WBCs}

## 2023-05-08 LAB — BASIC METABOLIC PANEL
Anion gap: 13 (ref 5–15)
BUN: 32 mg/dL — ABNORMAL HIGH (ref 6–20)
CO2: 19 mmol/L — ABNORMAL LOW (ref 22–32)
Calcium: 7.9 mg/dL — ABNORMAL LOW (ref 8.9–10.3)
Chloride: 103 mmol/L (ref 98–111)
Creatinine, Ser: 2.26 mg/dL — ABNORMAL HIGH (ref 0.44–1.00)
GFR, Estimated: 28 mL/min — ABNORMAL LOW (ref >60)
Glucose, Bld: 306 mg/dL — ABNORMAL HIGH (ref 70–99)
Potassium: 4.5 mmol/L (ref 3.5–5.1)
Sodium: 135 mmol/L (ref 135–145)

## 2023-05-08 LAB — PROTIME-INR
INR: 1.2 (ref 0.8–1.2)
Prothrombin Time: 15.1 s (ref 11.4–15.2)

## 2023-05-08 LAB — I-STAT CG4 LACTIC ACID, ED
Lactic Acid, Venous: 2.7 mmol/L (ref 0.5–1.9)
Lactic Acid, Venous: 2.8 mmol/L (ref 0.5–1.9)
Lactic Acid, Venous: 3.1 mmol/L (ref 0.5–1.9)

## 2023-05-08 LAB — HCG, SERUM, QUALITATIVE: Preg, Serum: NEGATIVE

## 2023-05-08 LAB — MAGNESIUM: Magnesium: 1.2 mg/dL — ABNORMAL LOW (ref 1.7–2.4)

## 2023-05-08 LAB — PHOSPHORUS: Phosphorus: 4.1 mg/dL (ref 2.5–4.6)

## 2023-05-08 LAB — APTT: aPTT: 24 s (ref 24–36)

## 2023-05-08 MED ORDER — ONDANSETRON HCL 4 MG/2ML IJ SOLN: 4.0000 mg | Freq: Four times a day (QID) | INTRAMUSCULAR | Status: DC | PRN

## 2023-05-08 MED ORDER — DOCUSATE SODIUM 100 MG PO CAPS: 100.0000 mg | ORAL_CAPSULE | Freq: Two times a day (BID) | ORAL | Status: DC | PRN

## 2023-05-08 MED ORDER — POLYETHYLENE GLYCOL 3350 17 G PO PACK: 17.0000 g | PACK | Freq: Every day | ORAL | Status: DC | PRN

## 2023-05-08 MED ORDER — TACROLIMUS 1 MG PO CAPS: 1.0000 mg | ORAL_CAPSULE | Freq: Every day | ORAL | Status: DC

## 2023-05-08 MED ORDER — HYDROMORPHONE HCL 1 MG/ML IJ SOLN
0.5000 mg | Freq: Once | INTRAMUSCULAR | Status: AC
  Administered 2023-05-08: 0.5 mg via INTRAVENOUS
  Filled 2023-05-08: qty 1

## 2023-05-08 MED ORDER — MYCOPHENOLATE MOFETIL 250 MG PO CAPS: 250.0000 mg | ORAL_CAPSULE | Freq: Two times a day (BID) | ORAL | Status: DC

## 2023-05-08 MED ORDER — LACTATED RINGERS IV BOLUS: 1000.0000 mL | Freq: Once | INTRAVENOUS | Status: AC

## 2023-05-08 MED ORDER — PANTOPRAZOLE SODIUM 40 MG IV SOLR: 40.0000 mg | Freq: Every day | INTRAVENOUS | Status: DC

## 2023-05-08 MED ORDER — SODIUM CHLORIDE 0.9 % IV SOLN: 1.0000 g | Freq: Two times a day (BID) | INTRAVENOUS | Status: DC

## 2023-05-08 MED ORDER — LACTATED RINGERS IV BOLUS: 1000.0000 mL | Freq: Once | INTRAVENOUS | Status: DC

## 2023-05-08 MED ORDER — ORAL CARE MOUTH RINSE: 15.0000 mL | OROMUCOSAL | Status: DC | PRN

## 2023-05-08 MED ORDER — SODIUM CHLORIDE 0.9 % IV SOLN
1.0000 g | Freq: Once | INTRAVENOUS | Status: AC
  Administered 2023-05-08: 1 g via INTRAVENOUS
  Filled 2023-05-08: qty 20

## 2023-05-08 MED ORDER — HYDROCORTISONE SOD SUC (PF) 100 MG IJ SOLR: 100.0000 mg | Freq: Three times a day (TID) | INTRAMUSCULAR | Status: DC

## 2023-05-08 MED ORDER — INSULIN ASPART 100 UNIT/ML IJ SOLN
5.0000 [IU] | Freq: Once | INTRAMUSCULAR | Status: AC
  Administered 2023-05-08: 5 [IU] via SUBCUTANEOUS

## 2023-05-08 MED ORDER — HEPARIN (PORCINE) 25000 UT/250ML-% IV SOLN
1100.0000 [IU]/h | INTRAVENOUS | Status: DC
  Administered 2023-05-08: 1100 [IU]/h via INTRAVENOUS
  Filled 2023-05-08: qty 250

## 2023-05-08 MED ORDER — TACROLIMUS 1 MG PO CAPS
6.0000 mg | ORAL_CAPSULE | Freq: Two times a day (BID) | ORAL | Status: DC
Start: 2023-05-08 — End: 2023-05-08
  Filled 2023-05-08: qty 6

## 2023-05-08 MED ORDER — HYDROCORTISONE SOD SUC (PF) 100 MG IJ SOLR
100.0000 mg | Freq: Three times a day (TID) | INTRAMUSCULAR | Status: DC
  Administered 2023-05-08: 100 mg via INTRAVENOUS
  Filled 2023-05-08: qty 2

## 2023-05-08 MED ORDER — INSULIN REGULAR(HUMAN) IN NACL 100-0.9 UT/100ML-% IV SOLN: 10.0000 [IU]/h | INTRAVENOUS | Status: DC

## 2023-05-08 MED ORDER — INSULIN REGULAR(HUMAN) IN NACL 100-0.9 UT/100ML-% IV SOLN: INTRAVENOUS | Status: DC

## 2023-05-08 MED ORDER — NOREPINEPHRINE 4 MG/250ML-% IV SOLN: 0.0000 ug/min | INTRAVENOUS | Status: DC

## 2023-05-08 MED ORDER — HEPARIN (PORCINE) 25000 UT/250ML-% IV SOLN: 1100.0000 [IU]/h | INTRAVENOUS | Status: DC

## 2023-05-08 MED ORDER — LACTATED RINGERS IV BOLUS
1000.0000 mL | Freq: Once | INTRAVENOUS | Status: AC
  Administered 2023-05-08: 1000 mL via INTRAVENOUS

## 2023-05-08 MED ORDER — ACETAMINOPHEN 650 MG RE SUPP
650.0000 mg | Freq: Once | RECTAL | Status: AC
  Administered 2023-05-08: 650 mg via RECTAL
  Filled 2023-05-08: qty 1

## 2023-05-08 MED ORDER — NOREPINEPHRINE 4 MG/250ML-% IV SOLN
0.0000 ug/min | INTRAVENOUS | Status: DC
  Administered 2023-05-08: 2 ug/min via INTRAVENOUS
  Filled 2023-05-08: qty 250

## 2023-05-08 NOTE — ED Provider Notes (Signed)
 Signed out to me at shift change.  Still tachy.  Ordered another liter of fluids.  Temp is down trending.  0030 Lactic uptrending.  Patient still hasn't gotten 2nd liter of fluids due to RN being with another critically ill patient.  I asked charge RN to assist.    Patient next up for CT.  Physical Exam  BP (!) 80/58   Pulse (!) 113   Temp 99.4 F (37.4 C) (Oral)   Resp 20   Ht 5\' 6"  (1.676 m)   Wt 81.6 kg   SpO2 97%   BMI 29.05 kg/m   Physical Exam Constitutional:      Appearance: She is well-developed.  HENT:     Head: Normocephalic.  Eyes:     Extraocular Movements: Extraocular movements intact.  Cardiovascular:     Rate and Rhythm: Tachycardia present.  Pulmonary:     Effort: Pulmonary effort is normal.  Neurological:     Mental Status: She is alert.     Procedures  .Critical Care  Performed by: Roxy Horseman, PA-C Authorized by: Roxy Horseman, PA-C   Critical care provider statement:    Critical care time (minutes):  50   Critical care was necessary to treat or prevent imminent or life-threatening deterioration of the following conditions:  Sepsis   Critical care was time spent personally by me on the following activities:  Development of treatment plan with patient or surrogate, discussions with consultants, evaluation of patient's response to treatment, examination of patient, ordering and review of laboratory studies, ordering and review of radiographic studies, ordering and performing treatments and interventions, pulse oximetry, re-evaluation of patient's condition and review of old charts   ED Course / MDM   Clinical Course as of 05/08/23 0419  Sat May 07, 2023  2255 Creatinine(!): 1.90 [LS]  Sun May 08, 2023  0112 I called [facility] transfer line for transport. [RB]  0132 I got a call back from [facility], Dr. Carlene Coria, renal transplant nephrologist.  Accepts in transfer.  Waiting for bed at [facility].   [RB]  0231 CT ABDOMEN PELVIS WO CONTRAST Worrisome for pyelo,  cystitis, possible rejection, and fluid collection. [RB]  0403 RN informs me that BP has dropped to 80 systolic.  I discussed with Dr. Manus Gunning.  Patient is mentating well, sitting up in bed, but with BP dropping and lactic uptrending, will give an additional liter and start levophed.  Will need to admit here while she waits for the bed at [facility]. [RB]  [phone] I consulted with Dr. Larinda Buttery, who is appreciated for admitting for stabilization.    Recommends switching to meropenem for abx.  I've ordered.  I adjusted CellCept as per Dr. Carlene Coria.  No change to Tacrolimus.  [RB]    Clinical Course User Index [LS] Meade Maw, Student-PA [RB] Roxy Horseman, PA-C   Medical Decision Making Amount and/or Complexity of Data Reviewed Labs: ordered. Radiology: ordered. Decision-making details documented in ED Course.  Risk OTC drugs. Prescription drug management. Decision regarding hospitalization.    Decrease cellcept 250mg  BID down from 750mg  Don't adjust tacrolimus levels.  Call for additional guidance if needed:   Dr. Lucienne Minks True - Renal Transplant at [facility] [place]     Roxy Horseman, PA-C 05/08/23 0420    Glynn Octave, MD 05/08/23 [phone]

## 2023-05-08 NOTE — ED Notes (Signed)
 Called pharmacy tech to get a med reconciliation.

## 2023-05-08 NOTE — Progress Notes (Signed)
 Patient arrived to unit at approximately 0615 with one PIV access. Multiple drips and two more fluid boluses are ordered. Only able to run the norepinephrine at this time. IV team consult placed, MD aware.

## 2023-05-08 NOTE — ED Notes (Signed)
 CCMD notfied. Pt is on monitor.

## 2023-05-08 NOTE — Discharge Summary (Signed)
 Physician Discharge Summary       Patient ID: [name] MRN: [id number] DOB/AGE: January 09, 1986 38 y.o.  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  Was found to be in septic shock secondary to UTI.  Admitted to the intensive care unit for pressor support.   She underwent deceased donor kidney transplant in December 2024 and has been on tacrolimus, MMF and prednisone 5 mg daily.  She was induced with Alemtuzumab.    For the past couple of days she started having nausea vomiting and lower abdominal pain and cramping.  She also endorsed fever and chills.   Initially in the ED she was hemodynamically stable received 3 L LR and blood pressure down trended requiring norepinephrine.  Lactate uptrending now at 3.7.   Labs with leukocytosis at 16.1 with neutrophilic predominance.  Creatinine 1.9 mg/dL with BUN 30.  UA cloudy with large glucose, trace leukocytes and many bacteria.   CT abdomen and pelvis with pneumatosis of the bladder and diffuse bladder wall thickening worrisome for emphysematous cystitis.  Left lower quadrant with perinephric fat stranding concerning for pyelonephritis versus rejection.   ED discussed with Dr. Lucienne Minks True [phone]) patient accepted to [facility]. Recs: MMF 250 BID from 750, no adjustments to Tacrolimus.     Hospital Course:  Worsening  hypotension requiring pressors. LA peaked at 3.1 and downtrending.   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  Was found to be in septic shock secondary to UTI.  Admitted to the intensive care unit for pressor support.   #Septic shock secondary to...  #Emphysematous cystitis and pyelonephritis.Marland Kitchen  #Recent DCD kidney transplant 02/2023 on Tacro, MMF and Prednisone #AKI with Creat 1.9 mg/dl from a baseline of 1.4mg /dl likely secondary to septic atn vs rejection  #Lactic acidosis in the setting of the above #Remote hx of DVT on eliquis    Neuro: No issues  CVS: NE for MAP > 65. Stress dose steroids. Hold Home antihypertensives.  Pulm: No issues  GI: No issues  Renal/ID: Monitor UOP. Avoid nephrotox agents. Meropenem for Abx coverage. Follow urine culture. C/w Tacrom 6mg  bid, MMF discontinued per [facility] Kidney tx. Stress dose steroids as above.  Heme: Hep drip  Endo POC 140-180 insulin drip    Accepted at [facility] ICU 4327 - Dr. Normajean Glasgow.   Significant Hospital Tests/Studies: CT abdomen and pelvis with pneumatosis of the bladder and diffuse bladder wall thickening worrisome for emphysematous cystitis. Left  lower quadrant with perinephric fat stranding concerning for pyelonephritis versus rejection.   Discharge Exam: BP 110/79   Pulse (!) 102   Temp 99.4 F (37.4 C) (Oral)   Resp 16   Ht 5\' 6"  (1.676 m)   Wt 81.6 kg   SpO2 97%   BMI 29.05 kg/m   General: Awake and  alert, in no distress  HENT: Supple neck, reactive pupils  Lungs: Clear bilateral air entry  Cardiovascular: Normal S1, Normal S2, RRR, no murmurs or extrasounds appreciated Abdomen: Tender in the lower quadrants, transplant wound appears clear without discharges  Extremities: Warm and well perfused.     Labs at Discharge: Lab Results  Component Value Date   CREATININE 2.26 (H) 05/08/2023   BUN 32 (H) 05/08/2023   NA 135 05/08/2023   K 4.5 05/08/2023   CL 103 05/08/2023   CO2 19 (L) 05/08/2023   Lab Results  Component Value Date   WBC 14.8 (H) 05/08/2023   HGB 9.5 (L) 05/08/2023   HCT 31.1 (L) 05/08/2023   MCV 95.4 05/08/2023   PLT 141 (L) 05/08/2023   Lab Results  Component Value Date   ALT 16 05/07/2023   AST 24 05/07/2023   ALKPHOS 64 05/07/2023   BILITOT 0.8 05/07/2023   Lab Results  Component Value Date   INR 1.2 05/08/2023   INR 1.1 03/31/2022   INR 1.1 03/29/2022    Current Radiology Studies: CT ABDOMEN PELVIS WO CONTRAST Result Date: 05/08/2023 CLINICAL DATA:  Sepsis EXAM: CT ABDOMEN AND PELVIS WITHOUT CONTRAST TECHNIQUE: Multidetector CT imaging of the abdomen and pelvis was performed following the standard protocol without IV contrast. RADIATION DOSE REDUCTION: This exam was performed according to the departmental dose-optimization program which includes automated exposure control, adjustment of the mA and/or kV according to patient size and/or use of iterative reconstruction technique. COMPARISON:  CT abdomen and pelvis 12/20/2022 FINDINGS: Lower chest: No acute abnormality. Hepatobiliary: No focal liver abnormality is seen. Status post cholecystectomy. No biliary dilatation. Pancreas: Unremarkable. No pancreatic ductal dilatation or surrounding inflammatory changes. Spleen: Normal in size without focal abnormality. Adrenals/Urinary Tract: Right kidney is not visualized. Native left kidney is atrophic. The bilateral adrenal glands are within normal limits. There  is a transplanted left lower quadrant kidney which is new from prior. There is left perinephric fat stranding. There is also stranding surrounding the left renal pelvis. No definitive hydronephrosis or urinary tract calculus. There is pneumatosis of the bladder wall. There is mild diffuse bladder wall thickening. Stomach/Bowel: Stomach is within normal limits. Appendix is not seen. No evidence of bowel wall thickening, distention, or inflammatory changes. Vascular/Lymphatic: No significant vascular findings are present. No enlarged abdominal or pelvic lymph nodes. Reproductive: Status post hysterectomy. No adnexal masses. Other: There is scarring in the lower anterior abdominal wall. There is a subcutaneous collection in the lower left anterior abdominal wall scar measuring 4.6 x 4.5 x 4.2 cm. This is low attenuation. No surrounding inflammation. There is no focal abdominal wall hernia or ascites. Musculoskeletal: No acute or significant osseous findings. IMPRESSION: 1. Pneumatosis of the bladder wall with mild diffuse bladder wall thickening. Findings are worrisome for emphysematous cystitis. 2. Left lower quadrant transplanted kidney with perinephric fat stranding and stranding surrounding the left renal pelvis. Findings are worrisome for pyelonephritis or rejection. 3. Subcutaneous collection in the lower left anterior abdominal wall scar measuring 4.6 cm. This may represent a postoperative seroma or hematoma. Infection not excluded. Emphysema (ICD10-J43.9). Electronically Signed   By:  Darliss Cheney M.D.   On: 05/08/2023 01:03   DG Chest Portable 1 View Result Date: 05/07/2023 CLINICAL DATA:  Sepsis EXAM: PORTABLE CHEST 1 VIEW COMPARISON:  12/21/2022 FINDINGS: Heart and mediastinal contours are within normal limits. No focal opacities or effusions. No acute bony abnormality. IMPRESSION: No active disease. Electronically Signed   By: Charlett Nose M.D.   On: 05/07/2023 21:13    Disposition:   Accepted at  [facility] ICU 4327 - Dr. Normajean Glasgow.      Allergies as of 05/08/2023       Reactions   Diphenhydramine Anaphylaxis   Peanut-containing Drug Products Itching, Hives   Able to tolerate when cooked in foods   Contrast Media [iodinated Contrast Media] Itching   Shellfish Allergy Hives   Chlorhexidine Itching   Ferrous Sulfate Itching   Iron Dextran Itching, Other (See Comments)   Vein irritation        Medication List     STOP taking these medications    acetaminophen 500 MG tablet Commonly known as: TYLENOL   aspirin 81 MG chewable tablet   calcitRIOL 0.5 MCG capsule Commonly known as: ROCALTROL   calcium carbonate (dosed in mg elemental calcium) 1250 MG/5ML Susp   calcium carbonate 750 MG chewable tablet Commonly known as: TUMS EX   carvedilol 12.5 MG tablet Commonly known as: COREG   cetirizine HCl 5 MG/5ML Soln Commonly known as: Zyrtec   cyclobenzaprine 5 MG tablet Commonly known as: FLEXERIL   Eliquis 5 MG Tabs tablet Generic drug: apixaban   fluticasone 50 MCG/ACT nasal spray Commonly known as: FLONASE   gabapentin 300 MG capsule Commonly known as: NEURONTIN   HYDROcodone-acetaminophen 5-325 MG tablet Commonly known as: Norco   hydrOXYzine 25 MG tablet Commonly known as: ATARAX   insulin degludec 100 UNIT/ML FlexTouch Pen Commonly known as: TRESIBA   Jardiance 10 MG Tabs tablet Generic drug: empagliflozin   lisinopril 10 MG tablet Commonly known as: ZESTRIL   loperamide 2 MG capsule Commonly known as: IMODIUM   methocarbamol 500 MG tablet Commonly known as: ROBAXIN   MG Plus Protein 133 MG Tabs   mycophenolate 250 MG capsule Commonly known as: CELLCEPT   NovoLOG FlexPen 100 UNIT/ML FlexPen Generic drug: insulin aspart   nystatin 100000 UNIT/ML suspension Commonly known as: MYCOSTATIN   omeprazole 40 MG capsule Commonly known as: PRILOSEC   ondansetron 4 MG disintegrating tablet Commonly known as: ZOFRAN-ODT    pravastatin 40 MG tablet Commonly known as: PRAVACHOL   predniSONE 5 MG tablet Commonly known as: DELTASONE   sucralfate 1 g tablet Commonly known as: CARAFATE   sulfamethoxazole-trimethoprim 400-80 MG tablet Commonly known as: BACTRIM   tacrolimus 1 MG capsule Commonly known as: PROGRAF   valGANciclovir 450 MG tablet Commonly known as: VALCYTE   Voltaren 1 % Gel Generic drug: diclofenac Sodium       TAKE these medications    docusate sodium 100 MG capsule Commonly known as: COLACE Take 1 capsule (100 mg total) by mouth 2 (two) times daily as needed for mild constipation.   heparin 16109 UT/250ML infusion Inject 1,100 Units/hr into the vein continuous.   hydrocortisone sodium succinate 100 MG injection Commonly known as: SOLU-CORTEF Inject 2 mLs (100 mg total) into the vein every 8 (eight) hours.   Insulin Regular(Human) in NaCl 100-0.9 UT/100ML-% Soln infusion Commonly known as: MYXREDLIN Inject 10 Units/hr into the vein continuous.   lactated ringers Inject 1,000 mLs into the vein once for 1 dose.  lactated ringers Inject 1,000 mLs into the vein once for 1 dose.   meropenem 1 g in sodium chloride 0.9 % 100 mL Inject 1 g into the vein 2 (two) times daily.   mouth rinse Liqd solution 15 mLs by Mouth Rinse route as needed (for oral care).   norepinephrine 4-5 MG/250ML-% Soln Commonly known as: LEVOPHED Inject 0-40 mcg/min into the vein continuous.   ondansetron 4 MG/2ML Soln injection Commonly known as: ZOFRAN Inject 2 mLs (4 mg total) into the vein every 6 (six) hours as needed for nausea or vomiting.   pantoprazole 40 MG injection Commonly known as: PROTONIX Inject 40 mg into the vein daily.   polyethylene glycol 17 g packet Commonly known as: MIRALAX / GLYCOLAX Take 17 g by mouth daily as needed for moderate constipation.         Discharged Condition: good  40 minutes of time have been dedicated to discharge assessment, planning and  discharge instructions.   Signed:  Janann Colonel, MD [facility] 05/08/23 6:47 AM  Please see [email or web address] for pager details.  From 7A-7P if no response, please call [phone] After hours, please call ELink [phone]

## 2023-05-08 NOTE — Progress Notes (Signed)
 PHARMACY - ANTICOAGULATION CONSULT NOTE  Pharmacy Consult for Heparin Indication: DVT  Allergies  Allergen Reactions   Diphenhydramine Anaphylaxis   Peanut-Containing Drug Products Itching and Hives    Able to tolerate when cooked in foods   Contrast Media [Iodinated Contrast Media] Itching   Shellfish Allergy Hives   Chlorhexidine Itching   Ferrous Sulfate Itching   Iron Dextran Itching and Other (See Comments)    Vein irritation     Patient Measurements: Height: 5\' 6"  (167.6 cm) Weight: 81.6 kg (180 lb) IBW/kg (Calculated) : 59.3 Heparin Dosing Weight: 75 kg  Vital Signs: Temp: 99.4 F (37.4 C) (02/23 0330) Temp Source: Oral (02/23 0330) BP: 104/71 (02/23 0439) Pulse Rate: 105 (02/23 0439)  Labs: Recent Labs    05/07/23 2212 05/08/23 0012  HGB 11.6*  --   HCT 37.5  --   PLT 156  --   APTT  --  24  LABPROT  --  15.1  INR  --  1.2  CREATININE 1.90*  --     Estimated Creatinine Clearance: 42.8 mL/min (A) (by C-G formula based on SCr of 1.9 mg/dL (H)).   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   LEARNING DISABILITY 09/25/2007   Qualifier: Diagnosis of  By: Dayton Martes MD, Talia     Nausea and vomiting    Prolonged Q-T interval on ECG    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   Pyelonephritis 06/23/2014   Renal and perinephric abscess 11/01/2018   Renal disorder    Seasonal allergies    Seizures (HCC)    UTI (urinary tract infection) 01/09/2015   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    Qualifier: Diagnosis of  By: Sandi Mealy  MD, Judeth Cornfield      Medications:  Current Facility-Administered Medications on File Prior to Encounter  Medication Dose Route Frequency Provider Last Rate Last Admin   sodium chloride flush (NS) 0.9 % injection 3 mL  3 mL Intravenous Q12H Cephus Shelling, MD       sodium chloride flush (NS) 0.9 % injection 3 mL  3 mL Intravenous PRN Cephus Shelling, MD       Current Outpatient Medications on File Prior to Encounter  Medication Sig Dispense Refill   acetaminophen (TYLENOL) 500 MG tablet Take 500-1,000 mg by mouth every 6 (six) hours as needed for moderate pain.     aspirin 81 MG chewable tablet Chew 1 tablet by mouth daily.     calcitRIOL (ROCALTROL) 0.5 MCG capsule Take 0.5 mcg by mouth daily.     calcium carbonate (TUMS EX) 750 MG chewable tablet Chew 2 tablets by mouth 2 (two) times daily.     Calcium Carbonate Antacid (CALCIUM CARBONATE, DOSED IN MG ELEMENTAL CALCIUM,) 1250 MG/5ML SUSP Take 5 mLs by mouth 2 (two) times daily.     carvedilol (COREG) 12.5 MG tablet Take 12.5 mg by mouth 2 (two) times daily.     cetirizine HCl (ZYRTEC) 5 MG/5ML SOLN Take 10  mLs by mouth as needed for rhinitis, allergies or itching.     cyclobenzaprine (FLEXERIL) 5 MG tablet Take 5 mg by mouth daily as needed.     diclofenac Sodium (VOLTAREN) 1 % GEL Apply topically 4 (four) times daily.     ELIQUIS 5 MG TABS tablet Take 5 mg by mouth 2 (two) times daily.     fluticasone (FLONASE) 50 MCG/ACT nasal spray Place 2 sprays into both nostrils daily as needed for allergies.     gabapentin (NEURONTIN) 300 MG capsule Take 300 mg by mouth at bedtime.     HYDROcodone-acetaminophen (NORCO) 5-325 MG tablet Take 1 tablet by mouth every 6 (six) hours as needed. 30 tablet 0   hydrOXYzine (ATARAX) 25 MG tablet Take 50 mg by mouth 2 (two) times daily as needed for itching.     insulin aspart (NOVOLOG FLEXPEN) 100 UNIT/ML FlexPen Inject 1-10 Units into the skin 3 (three) times daily  with meals. Sliding Scale     insulin degludec (TRESIBA) 100 UNIT/ML FlexTouch Pen Inject 5-10 Units into the skin daily. (Patient taking differently: Inject 25 Units into the skin 2 (two) times daily. i)     JARDIANCE 10 MG TABS tablet Take 10 mg by mouth at bedtime.     lisinopril (ZESTRIL) 10 MG tablet Take 10 mg by mouth daily.     loperamide (IMODIUM) 2 MG capsule Take 2 mg by mouth as needed for diarrhea or loose stools.     methocarbamol (ROBAXIN) 500 MG tablet Take 500 mg by mouth at bedtime as needed for muscle spasms.     mycophenolate (CELLCEPT) 250 MG capsule Take 750 mg by mouth 2 (two) times daily.     nystatin (MYCOSTATIN) 100000 UNIT/ML suspension Take by mouth.     omeprazole (PRILOSEC) 40 MG capsule Take 1 capsule (40 mg total) by mouth 2 (two) times daily before a meal. Open capsule and place granules in water or applesauce to swallow 180 capsule 3   ondansetron (ZOFRAN-ODT) 4 MG disintegrating tablet Take 1 tablet (4 mg total) by mouth every 8 (eight) hours as needed for nausea or vomiting. 6 tablet 0   pravastatin (PRAVACHOL) 40 MG tablet Take 40 mg by mouth daily.     predniSONE (DELTASONE) 5 MG tablet Take 5 mg by mouth daily.     Specialty Vitamins Products (MG PLUS PROTEIN) 133 MG TABS Take 2 tablets by mouth 2 (two) times daily.     sucralfate (CARAFATE) 1 g tablet Take 1 g by mouth 3 (three) times daily as needed (esophagus pain).     tacrolimus (PROGRAF) 1 MG capsule Take 6 mg by mouth 2 (two) times daily.     valGANciclovir (VALCYTE) 450 MG tablet Take 450 mg by mouth daily.     [DISCONTINUED] esomeprazole (NEXIUM) 40 MG capsule Take 40 mg by mouth 2 (two) times daily before a meal. 30 min before meals (Patient not taking: Reported on 05/08/2023)     [DISCONTINUED] famotidine (PEPCID) 40 MG tablet Take 40 mg by mouth daily. (Patient not taking: Reported on 05/08/2023)     [DISCONTINUED] lidocaine-prilocaine (EMLA) cream Apply 1 application topically daily as needed  [facility]). (Patient not taking: Reported on 05/08/2023)     [DISCONTINUED] metoprolol tartrate (LOPRESSOR) 25 MG tablet Take 1 tablet (25 mg total) by mouth daily as needed (for systolic blood pressure or 135 or above). (Patient not taking: Reported on 05/08/2023)     [DISCONTINUED] midodrine (PROAMATINE) 5 MG tablet Take 5 mg  by mouth as needed (low blood pressure during dialysis). (Patient not taking: Reported on 05/08/2023)     [DISCONTINUED] pregabalin (LYRICA) 75 MG capsule Take 75 mg by mouth 3 (three) times daily. (Patient not taking: Reported on 05/08/2023)     [DISCONTINUED] promethazine (PHENERGAN) 25 MG tablet Take 1 tablet (25 mg total) by mouth every 6 (six) hours as needed for nausea or vomiting. (Patient not taking: Reported on 05/08/2023) 30 tablet 0   [DISCONTINUED] sevelamer (RENAGEL) 800 MG tablet Take 800 mg by mouth 3 (three) times daily with meals. Dissolve in Water (Patient not taking: Reported on 05/08/2023)     [DISCONTINUED] simvastatin (ZOCOR) 20 MG tablet Take 20 mg by mouth at bedtime. (Patient not taking: Reported on 05/08/2023)     [DISCONTINUED] tiZANidine (ZANAFLEX) 2 MG tablet Take 2 mg by mouth 2 (two) times daily as needed. (Patient not taking: Reported on 05/08/2023)       Assessment: 38 y.o. female with h/o DVT, Eliquis on hold, for heparin.  Last dose of Eliquis AM 2/22  Goal of Therapy:  aPTT 66-102 sec Heparin level 0.3-0.7 units/ml Monitor platelets by anticoagulation protocol: Yes   Plan:  Start heparin 1100 units/hr Check aPTT in 8 hours   Gustin Zobrist, Gary Fleet 05/08/2023,5:32 AM

## 2023-05-08 NOTE — Progress Notes (Deleted)
 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  Was found to be in septic shock secondary to UTI.  Admitted to the intensive care unit for pressor support.  She underwent deceased donor kidney transplant in December 10, 2024and has been on tacrolimus, MMF and prednisone 5 mg daily.  She was induced with Alemtuzumab.   For the past couple of days she started having nausea vomiting and lower abdominal pain and cramping.  She also endorsed fever and chills.  Initially in the ED she was hemodynamically stable received 3 L LR and blood pressure down trended requiring norepinephrine.  Lactate uptrending now at 3.7.  Labs with leukocytosis at 16.1 with neutrophilic predominance.  Creatinine 1.9 mg/dL with BUN 30.  UA cloudy with large glucose, trace leukocytes and many bacteria.  CT abdomen and pelvis with pneumatosis of the bladder and diffuse bladder wall thickening worrisome for emphysematous cystitis.  Left lower quadrant with perinephric fat stranding concerning for pyelonephritis versus rejection.  ED discussed with Dr. Lucienne Minks True [phone]) patient accepted to [facility]. Recs: MMF 250 BID from 750, no adjustments to Tacrolimus.   Pertinent  Medical History  ESRD s/p Deceased donor transplant in 02/22/2023 on Tacrolimus, MMF and Prednisone.   Significant Hospital Events: Including procedures, antibiotic start  and stop dates in addition to other pertinent events   -- 05/08/2023 Admitted to the ICU and started on pressors. Pending transfer to [facility].   Objective   Blood pressure (!) 80/58, pulse (!) 113, temperature 99.4 F (37.4 C), temperature source Oral, resp. rate 20, height 5\' 6"  (1.676 m), weight 81.6 kg, SpO2 97%.       No intake or output data in the 24 hours ending 05/08/23 0431 Filed Weights   05/07/23 2013  Weight: 81.6 kg    Examination: General: Awake and alert, in no distress  HENT: Supple neck, reactive pupils  Lungs: Clear bilateral air entry  Cardiovascular: Normal S1, Normal S2, RRR, no murmurs or extrasounds appreciated Abdomen: Tender in the lower quadrants, transplant wound appears clear without discharges  Extremities: Warm and well perfused.   Labs and imaging were reviewed.   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  Was found to be in septic shock secondary to UTI.  Admitted to the intensive care unit for pressor support.  #Septic shock secondary to...  #Emphysematous cystitis and pyelonephritis.Marland Kitchen  #Recent DCD kidney transplant February 22, 2023 on Tacro, MMF and Prednisone #AKI with Creat 1.9 mg/dl from a  baseline of 1.4mg /dl likely secondary to septic atn vs rejection  #Lactic acidosis in the setting of the above #Remote hx of DVT on eliquis   Neuro: No issues  CVS: NE for MAP > 65. Stress dose steroids. Hold Home antihypertensives.  Pulm: No issues  GI: No issues   Renal/ID: Monitor UOP. Avoid nephrotox agents. Meropenem for Abx coverage. Follow urine culture. C/w Tacrom 6mg  bid, MMF discontinued per [facility] Kidney tx. Stress dose steroids as above.  Heme: Hep drip  Endo POC 140-180 insulin drip   Accepted at [facility] ICU 4327 - Dr. Normajean Glasgow.  Best Practice (right click and "Reselect all SmartList Selections" daily)   Diet/type: clear liquids DVT prophylaxis systemic heparin Pressure ulcer(s): N/A GI prophylaxis: PPI Lines: N/A Foley:  N/A Code Status:  full code  Review of Systems:   All systems were reviewed and are negative except for the above.   Critical care time: 60 minutes.    Janann Colonel, MD [facility] 05/08/2023 5:10 AM

## 2023-05-08 NOTE — Progress Notes (Signed)
 Pharmacy Antibiotic Note  [name] is a 38 y.o. female s/p kidney transplant 02/2023 admitted on 05/07/2023 with urosepsis.  Pharmacy has been consulted for meropenem dosing.  Plan: Meropenem 1 g IV q12h  Height: 5\' 6"  (167.6 cm) Weight: 81.6 kg (180 lb) IBW/kg (Calculated) : 59.3  Temp (24hrs), Avg:100.7 F (38.2 C), Min:99.4 F (37.4 C), Max:102.4 F (39.1 C)  Recent Labs  Lab 05/07/23 2212 05/07/23 2219 05/08/23 0017 05/08/23 0250 05/08/23 0500  WBC 16.1*  --   --   --   --   CREATININE 1.90*  --   --   --   --   LATICACIDVEN  --  2.1* 2.7* 3.1* 2.8*    Estimated Creatinine Clearance: 42.8 mL/min (A) (by C-G formula based on SCr of 1.9 mg/dL (H)).    Allergies  Allergen Reactions   Diphenhydramine Anaphylaxis   Peanut-Containing Drug Products Itching and Hives    Able to tolerate when cooked in foods   Contrast Media [Iodinated Contrast Media] Itching   Shellfish Allergy Hives   Chlorhexidine Itching   Ferrous Sulfate Itching   Iron Dextran Itching and Other (See Comments)    Vein irritation     Eddie Candle 05/08/2023 5:31 AM

## 2023-05-08 NOTE — Progress Notes (Signed)
 Report given to [facility] at [phone]

## 2023-05-08 NOTE — Progress Notes (Signed)
 No IV access for insulin infusion now. Giving insulin aspart 5 units [place] now, can recheck at [facility] in 1h. Current BG 306.  Steffanie Dunn, DO 05/08/23 7:49 AM [facility]  For contact information, see Amion. If no response to pager, please call PCCM consult pager. After hours, 7PM- 7AM, please call Elink.

## 2023-05-09 LAB — GLUCOSE, CAPILLARY: Glucose-Capillary: 273 mg/dL — ABNORMAL HIGH (ref 70–99)

## 2023-05-10 LAB — URINE CULTURE: Culture: >=100000 — AB

## 2023-05-13 LAB — CULTURE, BLOOD (ROUTINE X 2)
Culture: NO GROWTH
Culture: NO GROWTH
Special Requests: ADEQUATE
Special Requests: ADEQUATE

## 2023-05-24 NOTE — H&P (Signed)
 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  Was found to be in septic shock secondary to UTI.  Admitted to the intensive care unit for pressor support.  She underwent deceased donor kidney transplant in 12-30-2024and has been on tacrolimus, MMF and prednisone 5 mg daily.  She was induced with Alemtuzumab.   For the past couple of days she started having nausea vomiting and lower abdominal pain and cramping.  She also endorsed fever and chills.  Initially in the ED she was hemodynamically stable received 3 L LR and blood pressure down trended requiring norepinephrine.  Lactate uptrending now at 3.7.  Labs with leukocytosis at 16.1 with neutrophilic predominance.  Creatinine 1.9 mg/dL with BUN 30.  UA cloudy with large glucose, trace leukocytes and many bacteria.  CT abdomen and pelvis with pneumatosis of the bladder and diffuse bladder wall thickening worrisome for emphysematous cystitis.  Left lower quadrant with perinephric fat stranding concerning for pyelonephritis versus rejection.  ED discussed with Dr. Lucienne Minks True [phone]) patient accepted to [facility]. Recs: MMF 250 BID from 750, no adjustments to Tacrolimus.   Pertinent  Medical History  ESRD s/p Deceased donor transplant in 03-14-23 on Tacrolimus, MMF and Prednisone.   Significant Hospital Events: Including procedures, antibiotic start  and stop dates in addition to other pertinent events   -- 05/08/2023 Admitted to the ICU and started on pressors. Pending transfer to [facility].   Objective   Blood pressure 114/74, pulse (!) 107, temperature 99.7 F (37.6 C), temperature source Oral, resp. rate 20, height 5\' 6"  (1.676 m), weight 81.6 kg, SpO2 90%.       No intake or output data in the 24 hours ending 05/24/23 0721 Filed Weights   05/07/23 2013  Weight: 81.6 kg    Examination: General: Awake and alert, in no distress  HENT: Supple neck, reactive pupils  Lungs: Clear bilateral air entry  Cardiovascular: Normal S1, Normal S2, RRR, no murmurs or extrasounds appreciated Abdomen: Tender in the lower quadrants, transplant wound appears clear without discharges  Extremities: Warm and well perfused.   Labs and imaging were reviewed.   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  Was found to be in septic shock secondary to UTI.  Admitted to the intensive care unit for pressor support.  #Septic shock secondary to...  #Emphysematous cystitis and pyelonephritis.Marland Kitchen  #Recent DCD kidney transplant 03-14-2023 on Tacro, MMF and Prednisone #AKI with Creat 1.9 mg/dl from a baseline  of 1.4mg /dl likely secondary to septic atn vs rejection  #Lactic acidosis in the setting of the above #Remote hx of DVT on eliquis   Neuro: No issues  CVS: NE for MAP > 65. Stress dose steroids. Hold Home antihypertensives.  Pulm: No issues  GI: No issues  Renal/ID:  Monitor UOP. Avoid nephrotox agents. Meropenem for Abx coverage. Follow urine culture. C/w Tacrom 6mg  bid, MMF discontinued per [facility] tx. Stress dose steroids as above.  Heme: Hep drip  Endo POC 140-180 insulin drip   Accepted at [facility] ICU 4327 - Dr. Normajean Glasgow.  Best Practice (right click and "Reselect all SmartList Selections" daily)   Diet/type: clear liquids DVT prophylaxis systemic heparin Pressure ulcer(s): N/A GI prophylaxis: PPI Lines: N/A Foley:  N/A Code Status:  full code  Review of Systems:   All systems were reviewed and are negative except for the above.   Critical care time: 60 minutes.    Janann Colonel, MD [facility] 05/24/2023 7:21 AM

## 2023-06-06 ENCOUNTER — Other Ambulatory Visit: Payer: Self-pay

## 2023-06-06 ENCOUNTER — Emergency Department (HOSPITAL_BASED_OUTPATIENT_CLINIC_OR_DEPARTMENT_OTHER)

## 2023-06-06 ENCOUNTER — Emergency Department (HOSPITAL_BASED_OUTPATIENT_CLINIC_OR_DEPARTMENT_OTHER)
Admission: EM | Admit: 2023-06-06 | Discharge: 2023-06-06 | Disposition: A | Discharge status: Home / Self Care | Attending: Emergency Medicine | Admitting: Emergency Medicine

## 2023-06-06 DIAGNOSIS — Z7901 Long term (current) use of anticoagulants: Secondary | ICD-10-CM | POA: Insufficient documentation

## 2023-06-06 DIAGNOSIS — A0472 Enterocolitis due to Clostridium difficile, not specified as recurrent: Secondary | ICD-10-CM | POA: Insufficient documentation

## 2023-06-06 DIAGNOSIS — Z940 Kidney transplant status: Secondary | ICD-10-CM | POA: Insufficient documentation

## 2023-06-06 DIAGNOSIS — R112 Nausea with vomiting, unspecified: Secondary | ICD-10-CM

## 2023-06-06 DIAGNOSIS — N186 End stage renal disease: Secondary | ICD-10-CM | POA: Insufficient documentation

## 2023-06-06 DIAGNOSIS — Z91010 Allergy to peanuts: Secondary | ICD-10-CM | POA: Insufficient documentation

## 2023-06-06 LAB — URINALYSIS, ROUTINE W REFLEX MICROSCOPIC
Bilirubin Urine: NEGATIVE
Glucose, UA: >1000 mg/dL — AB
Ketones, ur: NEGATIVE mg/dL
Leukocytes,Ua: NEGATIVE
Nitrite: NEGATIVE
Specific Gravity, Urine: 1.02 (ref 1.005–1.030)
pH: 5.5 (ref 5.0–8.0)

## 2023-06-06 LAB — HCG, SERUM, QUALITATIVE: Preg, Serum: NEGATIVE

## 2023-06-06 LAB — COMPREHENSIVE METABOLIC PANEL
ALT: 12 U/L (ref 0–44)
AST: 12 U/L — ABNORMAL LOW (ref 15–41)
Albumin: 4.5 g/dL (ref 3.5–5.0)
Alkaline Phosphatase: 78 U/L (ref 38–126)
Anion gap: 8 (ref 5–15)
BUN: 27 mg/dL — ABNORMAL HIGH (ref 6–20)
CO2: 24 mmol/L (ref 22–32)
Calcium: 8.9 mg/dL (ref 8.9–10.3)
Chloride: 102 mmol/L (ref 98–111)
Creatinine, Ser: 1.54 mg/dL — ABNORMAL HIGH (ref 0.44–1.00)
GFR, Estimated: 44 mL/min — ABNORMAL LOW (ref >60)
Glucose, Bld: 316 mg/dL — ABNORMAL HIGH (ref 70–99)
Potassium: 5 mmol/L (ref 3.5–5.1)
Sodium: 134 mmol/L — ABNORMAL LOW (ref 135–145)
Total Bilirubin: 0.5 mg/dL (ref 0.0–1.2)
Total Protein: 8.9 g/dL — ABNORMAL HIGH (ref 6.5–8.1)

## 2023-06-06 LAB — C DIFFICILE QUICK SCREEN W PCR REFLEX
C Diff antigen: POSITIVE — AB
C Diff toxin: NEGATIVE

## 2023-06-06 LAB — CBC WITH DIFFERENTIAL/PLATELET
Abs Immature Granulocytes: 0.02 10*3/uL (ref 0.00–0.07)
Basophils Absolute: 0.1 10*3/uL (ref 0.0–0.1)
Basophils Relative: 2 %
Eosinophils Absolute: 0.1 10*3/uL (ref 0.0–0.5)
Eosinophils Relative: 2 %
HCT: 33.9 % — ABNORMAL LOW (ref 36.0–46.0)
Hemoglobin: 10.8 g/dL — ABNORMAL LOW (ref 12.0–15.0)
Immature Granulocytes: 0 %
Lymphocytes Relative: 11 %
Lymphs Abs: 0.5 10*3/uL — ABNORMAL LOW (ref 0.7–4.0)
MCH: 28.6 pg (ref 26.0–34.0)
MCHC: 31.9 g/dL (ref 30.0–36.0)
MCV: 89.7 fL (ref 80.0–100.0)
Monocytes Absolute: 0.5 10*3/uL (ref 0.1–1.0)
Monocytes Relative: 9 %
Neutro Abs: 3.8 10*3/uL (ref 1.7–7.7)
Neutrophils Relative %: 76 %
Platelets: 202 10*3/uL (ref 150–400)
RBC: 3.78 MIL/uL — ABNORMAL LOW (ref 3.87–5.11)
RDW: 15.5 % (ref 11.5–15.5)
WBC: 5.1 10*3/uL (ref 4.0–10.5)
nRBC: 0 % (ref 0.0–0.2)

## 2023-06-06 LAB — LIPASE, BLOOD: Lipase: 39 U/L (ref 11–51)

## 2023-06-06 LAB — CLOSTRIDIUM DIFFICILE BY PCR, REFLEXED: Toxigenic C. Difficile by PCR: POSITIVE — AB

## 2023-06-06 MED ORDER — ONDANSETRON HCL 4 MG/2ML IJ SOLN
4.0000 mg | Freq: Once | INTRAMUSCULAR | Status: AC
  Administered 2023-06-06: 4 mg via INTRAVENOUS
  Filled 2023-06-06: qty 2

## 2023-06-06 MED ORDER — LACTATED RINGERS IV BOLUS
1000.0000 mL | Freq: Once | INTRAVENOUS | Status: AC
  Administered 2023-06-06: 1000 mL via INTRAVENOUS

## 2023-06-06 MED ORDER — METOCLOPRAMIDE HCL 5 MG/ML IJ SOLN
10.0000 mg | Freq: Once | INTRAMUSCULAR | Status: AC
  Administered 2023-06-06: 10 mg via INTRAVENOUS
  Filled 2023-06-06: qty 2

## 2023-06-06 MED ORDER — HYDROMORPHONE HCL 1 MG/ML IJ SOLN
0.5000 mg | Freq: Once | INTRAMUSCULAR | Status: AC
  Administered 2023-06-06: 0.5 mg via INTRAVENOUS
  Filled 2023-06-06: qty 1

## 2023-06-06 MED ORDER — ACETAMINOPHEN 500 MG PO TABS
1000.0000 mg | ORAL_TABLET | Freq: Once | ORAL | Status: AC
  Administered 2023-06-06: 1000 mg via ORAL
  Filled 2023-06-06: qty 2

## 2023-06-06 MED ORDER — ONDANSETRON 4 MG PO TBDP: 4.0000 mg | ORAL_TABLET | Freq: Three times a day (TID) | ORAL | 0 refills | Status: DC | PRN

## 2023-06-06 MED ORDER — FIDAXOMICIN 200 MG PO TABS: 200.0000 mg | ORAL_TABLET | Freq: Two times a day (BID) | ORAL | 0 refills | Status: DC

## 2023-06-06 MED ORDER — HYDROMORPHONE HCL 1 MG/ML IJ SOLN
1.0000 mg | Freq: Once | INTRAMUSCULAR | Status: AC
  Administered 2023-06-06: 1 mg via INTRAVENOUS
  Filled 2023-06-06: qty 1

## 2023-06-06 NOTE — Discharge Instructions (Signed)
 You were seen for your nausea, vomiting, and diarrhea in the emergency department.   At home, please take the zofran for your nausea and vomiting.  Take the Fidaxomicin for your C. difficile diarrhea.    Check your MyChart online for the results of any tests that had not resulted by the time you left the emergency department.   Follow-up with your primary doctor in 1 week regarding your visit.    Return immediately to the emergency department if you experience any of the following: severe pain, vomiting despite the medication, or any other concerning symptoms.    Thank you for visiting our Emergency Department. It was a pleasure taking care of you today.

## 2023-06-06 NOTE — ED Triage Notes (Signed)
 Pt c/o abd pain with N/V/D since Saturday. Pt also c/o hematochezia on Thursday and Friday, but has resolved. Pt has hx of kidney transplant, and unable to take her rejection medications. Pt states she is still able to urinate. Recent hx of UTI. Pt also reports hx of T1DM and having CBG in 200-300 range for several days.

## 2023-06-06 NOTE — ED Provider Notes (Signed)
 [place] EMERGENCY DEPARTMENT AT [facility] Provider Note   CSN: [id number] Arrival date & time: 06/06/23  1634     History {Add pertinent medical, surgical, social history, OB history to HPI:1} Chief Complaint  Patient presents with   Abdominal Pain    LAMONDA NOXON is a 38 y.o. female.  38 year old female with a history of gastroparesis, hiatal hernia status post repair, ESRD status post renal transplant on 02/2023 on tacrolimus and prednisone, and DVT on Eliquis who presents emergency department with nausea, vomiting, and diarrhea.  Patient reports that she recently was hospitalized and treated for pyelonephritis.  Was sent home on levofloxacin.  Reports that since Thursday of last week she has been feeling generally ill.  Also has had some low-grade fevers.  Has been having repetitive nausea and vomiting.  Approximately 5 episodes per day.  Nonbloody nonbilious.  Started developing some epigastric abdominal pain on time.  Also has been reporting multiple loose stools in that timeframe as well.  Has not been able to take her transplant medications because of this illness.  At that recent hospitalization was found to have a fluid collection that she reports was drained.  Abdominal surgeries include cholecystectomy and 2 renal transplants.       Home Medications Prior to Admission medications   Medication Sig Start Date End Date Taking? Authorizing Provider  docusate sodium (COLACE) 100 MG capsule Take 1 capsule (100 mg total) by mouth 2 (two) times daily as needed for mild constipation. 05/08/23   Assaker, [place], MD  heparin 25956 UT/250ML infusion Inject 1,100 Units/hr into the vein continuous. 05/08/23   Assaker, [place] Bali, MD  hydrocortisone sodium succinate (SOLU-CORTEF) 100 MG injection Inject 2 mLs (100 mg total) into the vein every 8 (eight) hours. 05/08/23   Assaker, [place]ali, MD  insulin regular, human (MYXREDLIN) 100 units/ 100 mL infusion Inject 10  Units/hr into the vein continuous. 05/08/23   Assaker, [place]ali, MD  meropenem 1 g in sodium chloride 0.9 % 100 mL Inject 1 g into the vein 2 (two) times daily. 05/08/23   Assaker, [place] Bali, MD  Mouthwashes (MOUTH RINSE) LIQD solution 15 mLs by Mouth Rinse route as needed (for oral care). 05/08/23   Assaker, [place]ali, MD  norepinephrine (LEVOPHED) 4-5 MG/250ML-% SOLN Inject 0-40 mcg/min into the vein continuous. 05/08/23   Assaker, [place] Bali, MD  ondansetron [facility]) 4 MG/2ML SOLN injection Inject 2 mLs (4 mg total) into the vein every 6 (six) hours as needed for nausea or vomiting. 05/08/23   Assaker, [place] Bali, MD  pantoprazole (PROTONIX) 40 MG injection Inject 40 mg into the vein daily. 05/08/23   Assaker, [place] Bali, MD  polyethylene glycol (MIRALAX / GLYCOLAX) 17 g packet Take 17 g by mouth daily as needed for moderate constipation. 05/08/23   Assaker, [place] Bali, MD      Allergies    Diphenhydramine, Peanut-containing drug products, Contrast media [iodinated contrast media], Shellfish allergy, Chlorhexidine, Ferrous sulfate, and Iron dextran    Review of Systems   Review of Systems  Physical Exam Updated Vital Signs BP (!) 154/119 (BP Location: Left Arm)   Pulse (!) 122   Temp 98.7 F (37.1 C) (Oral)   Resp 20   SpO2 100%  Physical Exam Vitals and nursing note reviewed.  Constitutional:      General: She is not in acute distress.    Appearance: She is well-developed.  HENT:     Head: Normocephalic and atraumatic.     Right Ear: External ear normal.  Left Ear: External ear normal.     Nose: Nose normal.  Eyes:     Extraocular Movements: Extraocular movements intact.     Conjunctiva/sclera: Conjunctivae normal.     Pupils: Pupils are equal, round, and reactive to light.  Cardiovascular:     Rate and Rhythm: Normal rate and regular rhythm.     Heart sounds: No murmur heard. Pulmonary:     Effort: Pulmonary effort is normal. No respiratory distress.     Breath  sounds: Normal breath sounds.  Abdominal:     General: Abdomen is flat. There is no distension.     Palpations: Abdomen is soft. There is no mass.     Tenderness: There is abdominal tenderness (epigastrium). There is left CVA tenderness. There is no right CVA tenderness or guarding.  Musculoskeletal:     Cervical back: Normal range of motion and neck supple.  Skin:    General: Skin is warm and dry.  Neurological:     Mental Status: She is alert and oriented to person, place, and time. Mental status is at baseline.  Psychiatric:        Mood and Affect: Mood normal.     ED Results / Procedures / Treatments   Labs (all labs ordered are listed, but only abnormal results are displayed) Labs Reviewed  CBC WITH DIFFERENTIAL/PLATELET  COMPREHENSIVE METABOLIC PANEL  LIPASE, BLOOD  HCG, SERUM, QUALITATIVE  URINALYSIS, ROUTINE W REFLEX MICROSCOPIC  CBG MONITORING, ED    EKG None  Radiology No results found.  Procedures Procedures  {Document cardiac monitor, telemetry assessment procedure when appropriate:1}  Medications Ordered in ED Medications - No data to display  ED Course/ Medical Decision Making/ A&P   {   Click here for ABCD2, HEART and other calculatorsREFRESH Note before signing :1}                              Medical Decision Making Amount and/or Complexity of Data Reviewed Labs: ordered. Radiology: ordered.  Risk Prescription drug management.   ***  {Document critical care time when appropriate:1} {Document review of labs and clinical decision tools ie heart score, Chads2Vasc2 etc:1}  {Document your independent review of radiology images, and any outside records:1} {Document your discussion with family members, caretakers, and with consultants:1} {Document social determinants of health affecting pt's care:1} {Document your decision making why or why not admission, treatments were needed:1} Final Clinical Impression(s) / ED Diagnoses Final diagnoses:   None    Rx / DC Orders ED Discharge Orders     None

## 2023-06-06 NOTE — ED Notes (Signed)
 Pt up to BR when she first got here, given cup for urine and hat for stool , pt peed and pooped in hat was unable   to get spec at this time

## 2023-06-07 ENCOUNTER — Ambulatory Visit (HOSPITAL_COMMUNITY): Admission: EM | Admit: 2023-06-07 | Discharge: 2023-06-07 | Disposition: A | Discharge status: Home / Self Care

## 2023-06-07 ENCOUNTER — Other Ambulatory Visit: Payer: Self-pay

## 2023-06-07 ENCOUNTER — Inpatient Hospital Stay (HOSPITAL_COMMUNITY)
Admission: EM | Admit: 2023-06-07 | Discharge: 2023-06-10 | DRG: 372 | Disposition: A | Discharge status: Home / Self Care | Attending: Internal Medicine | Admitting: Internal Medicine

## 2023-06-07 DIAGNOSIS — Q970 Karyotype 47, XXX: Secondary | ICD-10-CM

## 2023-06-07 DIAGNOSIS — K30 Functional dyspepsia: Secondary | ICD-10-CM | POA: Diagnosis present

## 2023-06-07 DIAGNOSIS — I959 Hypotension, unspecified: Secondary | ICD-10-CM | POA: Diagnosis present

## 2023-06-07 DIAGNOSIS — Z89412 Acquired absence of left great toe: Secondary | ICD-10-CM

## 2023-06-07 DIAGNOSIS — Z833 Family history of diabetes mellitus: Secondary | ICD-10-CM

## 2023-06-07 DIAGNOSIS — E785 Hyperlipidemia, unspecified: Secondary | ICD-10-CM | POA: Diagnosis present

## 2023-06-07 DIAGNOSIS — D631 Anemia in chronic kidney disease: Secondary | ICD-10-CM | POA: Diagnosis present

## 2023-06-07 DIAGNOSIS — R197 Diarrhea, unspecified: Secondary | ICD-10-CM | POA: Diagnosis not present

## 2023-06-07 DIAGNOSIS — K219 Gastro-esophageal reflux disease without esophagitis: Secondary | ICD-10-CM | POA: Diagnosis present

## 2023-06-07 DIAGNOSIS — E669 Obesity, unspecified: Secondary | ICD-10-CM | POA: Diagnosis present

## 2023-06-07 DIAGNOSIS — Z91041 Radiographic dye allergy status: Secondary | ICD-10-CM

## 2023-06-07 DIAGNOSIS — Z883 Allergy status to other anti-infective agents status: Secondary | ICD-10-CM

## 2023-06-07 DIAGNOSIS — E875 Hyperkalemia: Secondary | ICD-10-CM | POA: Diagnosis present

## 2023-06-07 DIAGNOSIS — Z79624 Long term (current) use of inhibitors of nucleotide synthesis: Secondary | ICD-10-CM

## 2023-06-07 DIAGNOSIS — Z8249 Family history of ischemic heart disease and other diseases of the circulatory system: Secondary | ICD-10-CM

## 2023-06-07 DIAGNOSIS — N1832 Chronic kidney disease, stage 3b: Secondary | ICD-10-CM | POA: Diagnosis present

## 2023-06-07 DIAGNOSIS — Z86718 Personal history of other venous thrombosis and embolism: Secondary | ICD-10-CM | POA: Diagnosis not present

## 2023-06-07 DIAGNOSIS — E1122 Type 2 diabetes mellitus with diabetic chronic kidney disease: Secondary | ICD-10-CM | POA: Diagnosis present

## 2023-06-07 DIAGNOSIS — A0811 Acute gastroenteropathy due to Norwalk agent: Secondary | ICD-10-CM | POA: Diagnosis present

## 2023-06-07 DIAGNOSIS — A0472 Enterocolitis due to Clostridium difficile, not specified as recurrent: Principal | ICD-10-CM | POA: Diagnosis present

## 2023-06-07 DIAGNOSIS — F819 Developmental disorder of scholastic skills, unspecified: Secondary | ICD-10-CM | POA: Diagnosis present

## 2023-06-07 DIAGNOSIS — D84821 Immunodeficiency due to drugs: Secondary | ICD-10-CM | POA: Diagnosis present

## 2023-06-07 DIAGNOSIS — Z6830 Body mass index (BMI) 30.0-30.9, adult: Secondary | ICD-10-CM

## 2023-06-07 DIAGNOSIS — E860 Dehydration: Secondary | ICD-10-CM | POA: Diagnosis present

## 2023-06-07 DIAGNOSIS — T8619 Other complication of kidney transplant: Secondary | ICD-10-CM | POA: Diagnosis present

## 2023-06-07 DIAGNOSIS — Z794 Long term (current) use of insulin: Secondary | ICD-10-CM

## 2023-06-07 DIAGNOSIS — E1165 Type 2 diabetes mellitus with hyperglycemia: Secondary | ICD-10-CM | POA: Diagnosis present

## 2023-06-07 DIAGNOSIS — Z832 Family history of diseases of the blood and blood-forming organs and certain disorders involving the immune mechanism: Secondary | ICD-10-CM

## 2023-06-07 DIAGNOSIS — I129 Hypertensive chronic kidney disease with stage 1 through stage 4 chronic kidney disease, or unspecified chronic kidney disease: Secondary | ICD-10-CM | POA: Diagnosis present

## 2023-06-07 DIAGNOSIS — Z8261 Family history of arthritis: Secondary | ICD-10-CM | POA: Diagnosis not present

## 2023-06-07 DIAGNOSIS — R1084 Generalized abdominal pain: Secondary | ICD-10-CM

## 2023-06-07 DIAGNOSIS — Z91013 Allergy to seafood: Secondary | ICD-10-CM

## 2023-06-07 DIAGNOSIS — Y830 Surgical operation with transplant of whole organ as the cause of abnormal reaction of the patient, or of later complication, without mention of misadventure at the time of the procedure: Secondary | ICD-10-CM | POA: Diagnosis present

## 2023-06-07 DIAGNOSIS — R112 Nausea with vomiting, unspecified: Secondary | ICD-10-CM | POA: Diagnosis present

## 2023-06-07 DIAGNOSIS — Z79899 Other long term (current) drug therapy: Secondary | ICD-10-CM

## 2023-06-07 DIAGNOSIS — Z79621 Long term (current) use of calcineurin inhibitor: Secondary | ICD-10-CM

## 2023-06-07 DIAGNOSIS — Z79818 Long term (current) use of other agents affecting estrogen receptors and estrogen levels: Secondary | ICD-10-CM

## 2023-06-07 DIAGNOSIS — Z7901 Long term (current) use of anticoagulants: Secondary | ICD-10-CM

## 2023-06-07 LAB — COMPREHENSIVE METABOLIC PANEL
ALT: 14 U/L (ref 0–44)
AST: 16 U/L (ref 15–41)
Albumin: 4 g/dL (ref 3.5–5.0)
Alkaline Phosphatase: 67 U/L (ref 38–126)
Anion gap: 10 (ref 5–15)
BUN: 22 mg/dL — ABNORMAL HIGH (ref 6–20)
CO2: 20 mmol/L — ABNORMAL LOW (ref 22–32)
Calcium: 8.6 mg/dL — ABNORMAL LOW (ref 8.9–10.3)
Chloride: 104 mmol/L (ref 98–111)
Creatinine, Ser: 1.5 mg/dL — ABNORMAL HIGH (ref 0.44–1.00)
GFR, Estimated: 45 mL/min — ABNORMAL LOW (ref >60)
Glucose, Bld: 323 mg/dL — ABNORMAL HIGH (ref 70–99)
Potassium: 4.5 mmol/L (ref 3.5–5.1)
Sodium: 134 mmol/L — ABNORMAL LOW (ref 135–145)
Total Bilirubin: 0.8 mg/dL (ref 0.0–1.2)
Total Protein: 8.8 g/dL — ABNORMAL HIGH (ref 6.5–8.1)

## 2023-06-07 LAB — GASTROINTESTINAL PANEL BY PCR, STOOL (REPLACES STOOL CULTURE)

## 2023-06-07 LAB — CBC
HCT: 34.6 % — ABNORMAL LOW (ref 36.0–46.0)
Hemoglobin: 10.6 g/dL — ABNORMAL LOW (ref 12.0–15.0)
MCH: 28.5 pg (ref 26.0–34.0)
MCHC: 30.6 g/dL (ref 30.0–36.0)
MCV: 93 fL (ref 80.0–100.0)
Platelets: 204 10*3/uL (ref 150–400)
RBC: 3.72 MIL/uL — ABNORMAL LOW (ref 3.87–5.11)
RDW: 15.1 % (ref 11.5–15.5)
WBC: 4.6 10*3/uL (ref 4.0–10.5)
nRBC: 0 % (ref 0.0–0.2)

## 2023-06-07 LAB — URINALYSIS, ROUTINE W REFLEX MICROSCOPIC
Bacteria, UA: NONE SEEN
Bilirubin Urine: NEGATIVE
Glucose, UA: >=500 mg/dL — AB
Hgb urine dipstick: NEGATIVE
Ketones, ur: NEGATIVE mg/dL
Leukocytes,Ua: NEGATIVE
Nitrite: NEGATIVE
Protein, ur: NEGATIVE mg/dL
Specific Gravity, Urine: 1.017 (ref 1.005–1.030)
pH: 5 (ref 5.0–8.0)

## 2023-06-07 LAB — LIPASE, BLOOD: Lipase: 45 U/L (ref 11–51)

## 2023-06-07 LAB — I-STAT CG4 LACTIC ACID, ED: Lactic Acid, Venous: 1 mmol/L (ref 0.5–1.9)

## 2023-06-07 LAB — HCG, SERUM, QUALITATIVE: Preg, Serum: NEGATIVE

## 2023-06-07 MED ORDER — ACETAMINOPHEN 325 MG PO TABS: 650.0000 mg | ORAL_TABLET | Freq: Four times a day (QID) | ORAL | Status: DC | PRN

## 2023-06-07 MED ORDER — APIXABAN 5 MG PO TABS
5.0000 mg | ORAL_TABLET | Freq: Two times a day (BID) | ORAL | Status: DC
  Administered 2023-06-08 – 2023-06-10 (×6): 5 mg via ORAL
  Filled 2023-06-07 (×6): qty 1

## 2023-06-07 MED ORDER — OXYCODONE HCL 5 MG PO TABS
5.0000 mg | ORAL_TABLET | Freq: Four times a day (QID) | ORAL | Status: DC | PRN
  Administered 2023-06-09 – 2023-06-10 (×2): 5 mg via ORAL
  Filled 2023-06-07 (×2): qty 1

## 2023-06-07 MED ORDER — ENOXAPARIN SODIUM 40 MG/0.4ML IJ SOSY
40.0000 mg | PREFILLED_SYRINGE | INTRAMUSCULAR | Status: DC
  Filled 2023-06-07: qty 0.4

## 2023-06-07 MED ORDER — FIDAXOMICIN 200 MG PO TABS
200.0000 mg | ORAL_TABLET | Freq: Two times a day (BID) | ORAL | Status: DC
  Administered 2023-06-08 – 2023-06-10 (×6): 200 mg via ORAL
  Filled 2023-06-07 (×8): qty 1

## 2023-06-07 MED ORDER — SODIUM CHLORIDE 0.9 % IV BOLUS
1000.0000 mL | Freq: Once | INTRAVENOUS | Status: AC
  Administered 2023-06-07: 1000 mL via INTRAVENOUS

## 2023-06-07 MED ORDER — SODIUM CHLORIDE 0.9 % IV SOLN
12.5000 mg | Freq: Four times a day (QID) | INTRAVENOUS | Status: DC | PRN
  Administered 2023-06-08: 12.5 mg via INTRAVENOUS
  Filled 2023-06-07: qty 12.5

## 2023-06-07 MED ORDER — ONDANSETRON HCL 4 MG/2ML IJ SOLN
4.0000 mg | Freq: Once | INTRAMUSCULAR | Status: AC
  Administered 2023-06-07: 4 mg via INTRAVENOUS
  Filled 2023-06-07: qty 2

## 2023-06-07 MED ORDER — MORPHINE SULFATE (PF) 4 MG/ML IV SOLN
4.0000 mg | Freq: Once | INTRAVENOUS | Status: AC
  Administered 2023-06-07: 4 mg via INTRAVENOUS
  Filled 2023-06-07: qty 1

## 2023-06-07 MED ORDER — CALCIUM CARBONATE ANTACID 500 MG PO CHEW: 1.0000 | CHEWABLE_TABLET | Freq: Two times a day (BID) | ORAL | Status: DC | PRN

## 2023-06-07 MED ORDER — MORPHINE SULFATE (PF) 2 MG/ML IV SOLN
2.0000 mg | INTRAVENOUS | Status: AC | PRN
  Administered 2023-06-07 – 2023-06-08 (×4): 2 mg via INTRAVENOUS
  Filled 2023-06-07 (×4): qty 1

## 2023-06-07 MED ORDER — MELATONIN 5 MG PO TABS
5.0000 mg | ORAL_TABLET | Freq: Every evening | ORAL | Status: DC | PRN
  Administered 2023-06-09: 5 mg via ORAL
  Filled 2023-06-07: qty 1

## 2023-06-07 MED ORDER — LACTATED RINGERS IV SOLN: INTRAVENOUS | Status: DC

## 2023-06-07 NOTE — H&P (Incomplete)
 History and Physical  [name] ZOX:[id number] DOB: 1985/04/09 DOA: 06/07/2023  Referring physician: Aaron Edelman PCP: Fleet Contras, MD  Outpatient Specialists: Transplant team at [facility] Patient coming from: Home.  Chief Complaint: Intractable nausea and vomiting.  HPI: [name] is a 38 y.o. female with medical history significant for left kidney transplant in December 2024 on immunosuppressants, followed by [facility], history of DVT on Eliquis, recent pyelonephritis involving transplant kidney, completed course of antibiotics who initially presented at [facility] ED with complaints of intractable nausea, vomiting, abdominal pain, and diarrhea.  She had a CT scan abdomen and pelvis without contrast on 06/06/2023 showing no acute localizing process in the abdomen or pelvis.  Stool test returned positive for C. difficile antigen and C. difficile by PCR.  Stool test also returned positive for norovirus infection.  She was prescribed Dificid which she had not yet started as she was unable to find a pharmacy that carries it in stock.  Due to the intractable nausea and vomiting she was unable to keep her immunosuppressants without regurgitating for the past 2 days.  Last bowel movement was last night and watery from C. difficile.  She presents to the ER for further evaluation.  In the ER, tachycardic and tachypneic.  She received IV fluid bolus 1 L NS, IV antiemetics, IV opiate based analgesics and her home transplant medications, immunosuppressants were restarted.  Admitted by [facility], hospitalist service, for intractable nausea and vomiting and treatment for C. difficile diarrhea.  ED Course: Temperature 98.7.  BP 145/98, pulse 111, respiratory rate 26, O2 saturation 100% on room air.  Lab studies notable for serum sodium 134, serum bicarb 20, serum glucose 323, BUN 22, creatinine 1.50, GFR 45.  Hemoglobin 10.0 6.  WBC 4.6.  Platelet count 204.  Review of Systems: Review of systems as noted in the HPI.  All other systems reviewed and are negative.   Past Medical History:  Diagnosis Date   Anemia    Blood transfusion without reported diagnosis    Cellulitis of left leg 03/01/2018   Chronic kidney disease    kidney transplant 07   Diabetes mellitus    Pt reports diagnosis in June 2011, Type 2   Diabetes mellitus without complication (HCC)    Esophageal obstruction due to food impaction    GERD (gastroesophageal reflux disease)    Hyperlipidemia    Hypertension    Intra-abdominal abscess (HCC) 10/28/2018   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   LEARNING DISABILITY 09/25/2007   Qualifier: Diagnosis of  By: Dayton Martes MD, Talia     Nausea and vomiting    Prolonged Q-T interval on ECG    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   Pyelonephritis 06/23/2014   Renal and perinephric abscess 11/01/2018   Renal disorder    Seasonal allergies    Seizures (HCC)    UTI (urinary tract infection) 01/09/2015   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   Qualifier: Diagnosis of  By: Sandi Mealy  MD, Judeth Cornfield     Past Surgical History:  Procedure Laterality Date   A/V FISTULAGRAM Left 02/24/2023   Procedure: A/V Fistulagram;  Surgeon: Cephus Shelling, MD;  Location: [facility] INVASIVE CV LAB;  Service: Cardiovascular;  Laterality: Left;   AMPUTATION Left 04/02/2022   Procedure: AMPUTATION LEFT GREAT TOE;  Surgeon: Nadara Mustard, MD;  Location: [facility] OR;  Service: Orthopedics;  Laterality: Left;   ARTERIOVENOUS GRAFT PLACEMENT Bilateral    "neither work" (10/24/2017)   AV FISTULA PLACEMENT Left 10/26/2018   Procedure:  CREATION OF ARTERIOVENOUS FISTULA  LEFT ARM;  Surgeon: Cephus Shelling, MD;  Location: [facility] OR;  Service: Vascular;  Laterality: Left;   AV FISTULA PLACEMENT Left 05/23/2020   Procedure: LEFT ARM ARTERIOVENOUS (AV) FISTULA CREATION;  Surgeon: Cephus Shelling, MD;  Location: [facility] OR;  Service: Vascular;  Laterality: Left;   BALLOON DILATION N/A 01/19/2021   Procedure: BALLOON DILATION;  Surgeon: Iva Boop, MD;  Location: [facility] ENDOSCOPY;  Service: Endoscopy;  Laterality: N/A;   BALLOON DILATION N/A 02/10/2021   Procedure: BALLOON DILATION;  Surgeon: Hilarie Fredrickson, MD;  Location: [facility] ENDOSCOPY;  Service: Endoscopy;  Laterality: N/A;   BALLOON DILATION N/A 02/19/2021   Procedure: BALLOON DILATION;  Surgeon: Imogene Burn, MD;  Location: [facility] ENDOSCOPY;  Service: Gastroenterology;  Laterality: N/A;   BASCILIC VEIN TRANSPOSITION Left 12/21/2018   Procedure: Left arm BASILIC VEIN TRANSPOSITION SECOND STAGE;  Surgeon: Cephus Shelling, MD;  Location: [facility] OR;  Service: Vascular;  Laterality: Left;   BIOPSY  12/29/2021   Procedure: BIOPSY;  Surgeon: Meryl Dare, MD;  Location: [facility] ENDOSCOPY;  Service: Gastroenterology;;   CHOLECYSTECTOMY N/A 06/30/2017   Procedure: LAPAROSCOPIC CHOLECYSTECTOMY WITH INTRAOPERATIVE CHOLANGIOGRAM;  Surgeon: Glenna Fellows, MD;  Location: [facility] ORS;  Service: General;  Laterality: N/A;   ESOPHAGOGASTRODUODENOSCOPY (EGD) WITH PROPOFOL N/A 07/04/2017   Procedure: ESOPHAGOGASTRODUODENOSCOPY (EGD) WITH PROPOFOL;  Surgeon: Vida Rigger, MD;  Location: [facility] ENDOSCOPY;  Service: Endoscopy;  Laterality: N/A;   ESOPHAGOGASTRODUODENOSCOPY (EGD) WITH PROPOFOL N/A 08/10/2020   Procedure: ESOPHAGOGASTRODUODENOSCOPY (EGD) WITH PROPOFOL;  Surgeon: Sherrilyn Rist, MD;  Location: [facility] ENDOSCOPY;  Service: Gastroenterology;  Laterality: N/A;   ESOPHAGOGASTRODUODENOSCOPY (EGD) WITH PROPOFOL N/A 01/19/2021   Procedure: ESOPHAGOGASTRODUODENOSCOPY (EGD) WITH PROPOFOL;  Surgeon: Iva Boop, MD;  Location: [facility] ENDOSCOPY;  Service: Endoscopy;  Laterality: N/A;  WITH FLUOROSCOPY AND DILATION   ESOPHAGOGASTRODUODENOSCOPY (EGD) WITH PROPOFOL N/A 02/03/2021   Procedure: ESOPHAGOGASTRODUODENOSCOPY (EGD) WITH PROPOFOL;  Surgeon: Iva Boop, MD;  Location: [facility] ENDOSCOPY;  Service: Endoscopy;  Laterality: N/A;   ESOPHAGOGASTRODUODENOSCOPY (EGD) WITH PROPOFOL N/A 02/10/2021   Procedure:  ESOPHAGOGASTRODUODENOSCOPY (EGD) WITH PROPOFOL;  Surgeon: Hilarie Fredrickson, MD;  Location: [facility] ENDOSCOPY;  Service: Endoscopy;  Laterality: N/A;  Balloon Dilation   ESOPHAGOGASTRODUODENOSCOPY (EGD) WITH PROPOFOL N/A 02/19/2021   Procedure: ESOPHAGOGASTRODUODENOSCOPY (EGD) WITH PROPOFOL;  Surgeon: Imogene Burn, MD;  Location: [facility] ENDOSCOPY;  Service: Gastroenterology;  Laterality: N/A;   ESOPHAGOGASTRODUODENOSCOPY (EGD) WITH PROPOFOL N/A 03/12/2021   Procedure: ESOPHAGOGASTRODUODENOSCOPY (EGD) WITH PROPOFOL;  Surgeon: Shellia Cleverly, DO;  Location: [facility] ENDOSCOPY;  Service: Gastroenterology;  Laterality: N/A;   ESOPHAGOGASTRODUODENOSCOPY (EGD) WITH PROPOFOL N/A 12/29/2021   Procedure: ESOPHAGOGASTRODUODENOSCOPY (EGD) WITH PROPOFOL;  Surgeon: Meryl Dare, MD;  Location: [facility] ENDOSCOPY;  Service: Gastroenterology;  Laterality: N/A;   FLEXIBLE SIGMOIDOSCOPY N/A 12/29/2021   Procedure: FLEXIBLE SIGMOIDOSCOPY;  Surgeon: Meryl Dare, MD;  Location: [facility] ENDOSCOPY;  Service: Gastroenterology;  Laterality: N/A;   FOREIGN BODY REMOVAL N/A 02/03/2021   Procedure: FOREIGN BODY REMOVAL;  Surgeon: Iva Boop, MD;  Location: [facility] ENDOSCOPY;  Service: Endoscopy;  Laterality: N/A;   INSERTION OF DIALYSIS CATHETER N/A 03/20/2018   Procedure: INSERTION OF TUNNELED DIALYSIS CATHETER - RIGHT INTERANL JUGULAR PLACEMENT;  Surgeon: Chuck Hint, MD;  Location: [facility] OR;  Service: Vascular;  Laterality: N/A;   IR FLUORO GUIDE CV LINE RIGHT  04/18/2020   IR GUIDED DRAIN W CATHETER PLACEMENT  10/28/2018   KIDNEY TRANSPLANT  2007   KIDNEY TRANSPLANT  Right    PARATHYROIDECTOMY  ?2012   "3/4 removed" (10/24/2017)   RENAL BIOPSY Bilateral 2003   REVISON OF ARTERIOVENOUS FISTULA Left 09/24/2020   Procedure: LEFT UPPER ARM ARTERIOVENOUS GRAFT CREATION;  Surgeon: Cephus Shelling, MD;  Location: [facility] OR;  Service: Vascular;  Laterality: Left;   UPPER EXTREMITY VENOGRAPHY Bilateral 10/19/2018   Procedure: UPPER EXTREMITY  VENOGRAPHY;  Surgeon: Cephus Shelling, MD;  Location: [facility];  Service: Cardiovascular;  Laterality: Bilateral;  Bilateral    UPPER EXTREMITY VENOGRAPHY Left 09/04/2020   Procedure: UPPER EXTREMITY VENOGRAPHY - Left Upper;  Surgeon: Cephus Shelling, MD;  Location: [facility];  Service: Cardiovascular;  Laterality: Left;    Social History:  reports that she has never smoked. She has never been exposed to tobacco smoke. She has never used smokeless tobacco. She reports that she does not currently use alcohol. She reports that she does not use drugs.   Allergies  Allergen Reactions   Diphenhydramine Anaphylaxis   Peanut-Containing Drug Products Itching and Hives    Able to tolerate when cooked in foods   Contrast Media [Iodinated Contrast Media] Itching   Shellfish Allergy Hives   Chlorhexidine Itching   Ferrous Sulfate Itching   Iron Dextran Itching and Other (See Comments)    Vein irritation     Family History  Problem Relation Age of Onset   Arthritis Mother    Hypertension Mother    Aneurysm Mother        died of brain aneurysm   CAD Father        Has 3 stents   Diabetes Father        borderline   Early death Brother        Died in war   Colon cancer Neg Hx    Esophageal cancer Neg Hx    Rectal cancer Neg Hx    Stomach cancer Neg Hx       Prior to Admission medications   Medication Sig Start Date End Date Taking? Authorizing Provider  docusate sodium (COLACE) 100 MG capsule Take 1 capsule (100 mg total) by mouth 2 (two) times daily as needed for mild constipation. 05/08/23   Assaker, [place] Bali, MD  fidaxomicin (DIFICID) 200 MG TABS tablet Take 1 tablet (200 mg total) by mouth 2 (two) times daily. 06/06/23   Rondel Baton, MD  heparin 40981 UT/250ML infusion Inject 1,100 Units/hr into the vein continuous. 05/08/23   Assaker, [place], MD  hydrocortisone sodium succinate (SOLU-CORTEF) 100 MG injection Inject 2 mLs (100 mg total) into  the vein every 8 (eight) hours. 05/08/23   Assaker, [place] Bali, MD  insulin regular, human (MYXREDLIN) 100 units/ 100 mL infusion Inject 10 Units/hr into the vein continuous. 05/08/23   Assaker, [place]ali, MD  meropenem 1 g in sodium chloride 0.9 % 100 mL Inject 1 g into the vein 2 (two) times daily. 05/08/23   Assaker, [place] Bali, MD  Mouthwashes (MOUTH RINSE) LIQD solution 15 mLs by Mouth Rinse route as needed (for oral care). 05/08/23   Assaker, [place] Bali, MD  norepinephrine (LEVOPHED) 4-5 MG/250ML-% SOLN Inject 0-40 mcg/min into the vein continuous. 05/08/23   Assaker, [place] Bali, MD  ondansetron [facility]) 4 MG/2ML SOLN injection Inject 2 mLs (4 mg total) into the vein every 6 (six) hours as needed for nausea or vomiting. 05/08/23   Assaker, [place] Bali, MD  ondansetron (ZOFRAN-ODT) 4 MG disintegrating tablet Take 1 tablet (4 mg total) by mouth every 8 (eight) hours as needed  for nausea or vomiting. 06/06/23   Rondel Baton, MD  pantoprazole (PROTONIX) 40 MG injection Inject 40 mg into the vein daily. 05/08/23   Assaker, [place] Bali, MD  polyethylene glycol (MIRALAX / GLYCOLAX) 17 g packet Take 17 g by mouth daily as needed for moderate constipation. 05/08/23   Janann Colonel, MD    Physical Exam: BP (!) 133/96   Pulse (!) 102   Temp 98.6 F (37 C)   Resp 14   Ht 5\' 6"  (1.676 m)   Wt 86.2 kg   SpO2 90%   BMI 30.67 kg/m   General: 38 y.o. year-old female well developed well nourished in no acute distress.  Alert and oriented x3. Cardiovascular: Regular rate and rhythm with no rubs or gallops.  No thyromegaly or JVD noted.  No lower extremity edema. 2/4 pulses in all 4 extremities. Respiratory: Clear to auscultation with no wheezes or rales. Good inspiratory effort. Abdomen: Soft nontender nondistended with normal bowel sounds x4 quadrants. Muskuloskeletal: No cyanosis, clubbing or edema noted bilaterally Neuro: CN II-XII intact, strength, sensation, reflexes Skin: No  ulcerative lesions noted or rashes Psychiatry: Judgement and insight appear normal. Mood is appropriate for condition and setting          Labs on Admission:  Basic Metabolic Panel: Recent Labs  Lab 06/06/23 1748 06/07/23 1608  NA 134* 134*  K 5.0 4.5  CL 102 104  CO2 24 20*  GLUCOSE 316* 323*  BUN 27* 22*  CREATININE 1.54* 1.50*  CALCIUM 8.9 8.6*   Liver Function Tests: Recent Labs  Lab 06/06/23 1748 06/07/23 1608  AST 12* 16  ALT 12 14  ALKPHOS 78 67  BILITOT 0.5 0.8  PROT 8.9* 8.8*  ALBUMIN 4.5 4.0   Recent Labs  Lab 06/06/23 1748 06/07/23 1608  LIPASE 39 45   No results for input(s): "AMMONIA" in the last 168 hours. CBC: Recent Labs  Lab 06/06/23 1748 06/07/23 1608  WBC 5.1 4.6  NEUTROABS 3.8  --   HGB 10.8* 10.6*  HCT 33.9* 34.6*  MCV 89.7 93.0  PLT 202 204   Cardiac Enzymes: No results for input(s): "CKTOTAL", "CKMB", "CKMBINDEX", "TROPONINI" in the last 168 hours.  BNP (last 3 results) No results for input(s): "BNP" in the last 8760 hours.  ProBNP (last 3 results) No results for input(s): "PROBNP" in the last 8760 hours.  CBG: No results for input(s): "GLUCAP" in the last 168 hours.  Radiological Exams on Admission: CT ABDOMEN PELVIS WO CONTRAST Result Date: 06/06/2023 CLINICAL DATA:  Abdominal pain with nausea and vomiting. Renal transplant. EXAM: CT ABDOMEN AND PELVIS WITHOUT CONTRAST TECHNIQUE: Multidetector CT imaging of the abdomen and pelvis was performed following the standard protocol without IV contrast. RADIATION DOSE REDUCTION: This exam was performed according to the departmental dose-optimization program which includes automated exposure control, adjustment of the mA and/or kV according to patient size and/or use of iterative reconstruction technique. COMPARISON:  CT abdomen and pelvis 05/08/2023 FINDINGS: Lower chest: No acute abnormality. Hepatobiliary: No focal liver abnormality is seen. Status post cholecystectomy. No biliary  dilatation. Pancreas: Unremarkable. No pancreatic ductal dilatation or surrounding inflammatory changes. Spleen: Normal in size without focal abnormality. Adrenals/Urinary Tract: Solitary native left kidney demonstrates atrophy, unchanged. Bilateral adrenal glands are within normal limits. There is a transplanted left lower quadrant kidney there is prominence of the left renal collecting system without obstructing calculus similar to prior. No significant perinephric stranding or perinephric fluid collection. The bladder is within normal limits. Stomach/Bowel:  Stomach is within normal limits. Appendix appears normal. No evidence of bowel wall thickening, distention, or inflammatory changes. Vascular/Lymphatic: No significant vascular findings are present. No enlarged abdominal or pelvic lymph nodes. Reproductive: Status post hysterectomy. No adnexal masses. Other: There is scarring in the lower anterior abdominal wall. Low-density collection in the subcutaneous tissues of the left anterior abdominal wall measures 3.2 x 3.9 cm and appears unchanged. There is no abdominal wall hernia or ascites. Musculoskeletal: No fracture is seen. IMPRESSION: 1. No acute localizing process in the abdomen or pelvis. 2. Transplanted left lower quadrant kidney with prominence of the left renal collecting system without obstructing calculus, similar to prior. 3. Stable low-density collection in the subcutaneous tissues of the left anterior abdominal wall. Electronically Signed   By: Darliss Cheney M.D.   On: 06/06/2023 20:34    EKG: I independently viewed the EKG done and my findings are as followed: None available at the time of this visit.  Assessment/Plan Present on Admission:  Intractable nausea and vomiting  Principal Problem:   Intractable nausea and vomiting  Intractable nausea and vomiting in the setting of norovirus and C. difficile infection, POA Continue supportive care IV antiemetics as needed IV fluid  hydration Replete electrolytes as indicated Continue to treat underlying conditions  C. difficile diarrhea Continue Dificid Continue IV fluid hydration As needed analgesics Latest stools are still watery.  History of left renal transplant. CKD 3B Followed by [facility] Missed 2 days of her renal transplant medications due to nausea and vomiting Resume home regimen Prior to admission, on CellCept, Prograf, prednisone, valganciclovir, Bactrim. Avoid nephrotoxic agents, dehydration and hypotension Monitor urine output Continue IV fluid hydration  Recent pyelonephritis involving her transplant kidney Per the patient she recently completed her course of antibiotics She was on Levaquin and Augmentin  History of DVT Resume home Eliquis   Time: 75 minutes.   DVT prophylaxis: Home Eliquis  Code Status: Full code.  Family Communication: Significant other at bedside.  Disposition Plan: Admitted to telemetry medical unit  Consults called: None.  Admission status: Inpatient status.   Status is: Inpatient The patient requires at least 2 midnights for further evaluation and treatment of present condition.  The patient will need to tolerate oral intake prior to discharge in order to avoid missing additional doses of her immunosuppressants.   Darlin Drop MD [facility] Pager [phone]  If 7PM-7AM, please contact night-coverage [email or web address] Password [facility]  06/07/2023, 10:16 PM

## 2023-06-07 NOTE — ED Provider Notes (Signed)
 [facility]  Note:  This document was prepared using Dragon voice recognition software and may include unintentional dictation errors.  MRN: [id number] DOB: 06-09-1985  Subjective:   [name] is a 38 y.o. female presenting for intractable vomiting and abdominal pain since yesterday when seen in the ER and diagnosed with C. difficile infection.  Patient reports she has not been unable to start medication for C. difficile infection because medication is not available at pharmacy.  Patient has been using her Zofran at home with no improvement to vomiting symptoms.  Patient still having central abdominal pain and cramping.  No current facility-administered medications for this encounter.  Current Outpatient Medications:    docusate sodium (COLACE) 100 MG capsule, Take 1 capsule (100 mg total) by mouth 2 (two) times daily as needed for mild constipation., Disp: , Rfl:    fidaxomicin (DIFICID) 200 MG TABS tablet, Take 1 tablet (200 mg total) by mouth 2 (two) times daily., Disp: 20 tablet, Rfl: 0   heparin 82956 UT/250ML infusion, Inject 1,100 Units/hr into the vein continuous., Disp: , Rfl:    hydrocortisone sodium succinate (SOLU-CORTEF) 100 MG injection, Inject 2 mLs (100 mg total) into the vein every 8 (eight) hours., Disp: , Rfl:    insulin regular, human (MYXREDLIN) 100 units/ 100 mL infusion, Inject 10 Units/hr into the vein continuous., Disp: , Rfl:    meropenem 1 g in sodium chloride 0.9 % 100 mL, Inject 1 g into the vein 2 (two) times daily., Disp: , Rfl:    Mouthwashes (MOUTH RINSE) LIQD solution, 15 mLs by Mouth Rinse route as needed (for oral care)., Disp: , Rfl:    norepinephrine (LEVOPHED) 4-5 MG/250ML-% SOLN, Inject 0-40 mcg/min into the vein continuous., Disp: , Rfl:    ondansetron (ZOFRAN) 4 MG/2ML SOLN injection, Inject 2 mLs (4 mg total) into the vein every 6 (six) hours as needed for nausea or vomiting., Disp: , Rfl:    ondansetron (ZOFRAN-ODT) 4 MG  disintegrating tablet, Take 1 tablet (4 mg total) by mouth every 8 (eight) hours as needed for nausea or vomiting., Disp: 12 tablet, Rfl: 0   pantoprazole (PROTONIX) 40 MG injection, Inject 40 mg into the vein daily., Disp: , Rfl:    polyethylene glycol (MIRALAX / GLYCOLAX) 17 g packet, Take 17 g by mouth daily as needed for moderate constipation., Disp: , Rfl:    Allergies  Allergen Reactions   Diphenhydramine Anaphylaxis   Peanut-Containing Drug Products Itching and Hives    Able to tolerate when cooked in foods   Contrast Media [Iodinated Contrast Media] Itching   Shellfish Allergy Hives   Chlorhexidine Itching   Ferrous Sulfate Itching   Iron Dextran Itching and Other (See Comments)    Vein irritation     Past Medical History:  Diagnosis Date   Anemia    Blood transfusion without reported diagnosis    Cellulitis of left leg 03/01/2018   Chronic kidney disease    kidney transplant 07   Diabetes mellitus    Pt reports diagnosis in June 2011, Type 2   Diabetes mellitus without complication (HCC)    Esophageal obstruction due to food impaction    GERD (gastroesophageal reflux disease)    Hyperlipidemia    Hypertension    Intra-abdominal abscess (HCC) 10/28/2018   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   LEARNING DISABILITY 09/25/2007   Qualifier: Diagnosis of  By: Dayton Martes MD, Talia     Nausea and vomiting  Prolonged Q-T interval on ECG    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   Pyelonephritis 06/23/2014   Renal and perinephric abscess 11/01/2018   Renal disorder    Seasonal allergies    Seizures (HCC)    UTI (urinary tract infection) 01/09/2015   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   Qualifier: Diagnosis of  By: Sandi Mealy  MD, Judeth Cornfield       Past Surgical History:  Procedure Laterality Date   A/V FISTULAGRAM Left 02/24/2023   Procedure: A/V Fistulagram;  Surgeon: Cephus Shelling, MD;  Location: [facility];  Service: Cardiovascular;  Laterality: Left;   AMPUTATION Left  04/02/2022   Procedure: AMPUTATION LEFT GREAT TOE;  Surgeon: Nadara Mustard, MD;  Location: [facility] OR;  Service: Orthopedics;  Laterality: Left;   ARTERIOVENOUS GRAFT PLACEMENT Bilateral    "neither work" (10/24/2017)   AV FISTULA PLACEMENT Left 10/26/2018   Procedure: CREATION OF ARTERIOVENOUS FISTULA  LEFT ARM;  Surgeon: Cephus Shelling, MD;  Location: [facility] OR;  Service: Vascular;  Laterality: Left;   AV FISTULA PLACEMENT Left 05/23/2020   Procedure: LEFT ARM ARTERIOVENOUS (AV) FISTULA CREATION;  Surgeon: Cephus Shelling, MD;  Location: [facility] OR;  Service: Vascular;  Laterality: Left;   BALLOON DILATION N/A 01/19/2021   Procedure: Marvis Repress DILATION;  Surgeon: Iva Boop, MD;  Location: [facility];  Service: Endoscopy;  Laterality: N/A;   BALLOON DILATION N/A 02/10/2021   Procedure: BALLOON DILATION;  Surgeon: Hilarie Fredrickson, MD;  Location: [facility];  Service: Endoscopy;  Laterality: N/A;   BALLOON DILATION N/A 02/19/2021   Procedure: BALLOON DILATION;  Surgeon: Imogene Burn, MD;  Location: [facility];  Service: Gastroenterology;  Laterality: N/A;   BASCILIC VEIN TRANSPOSITION Left 12/21/2018   Procedure: Left arm BASILIC VEIN TRANSPOSITION SECOND STAGE;  Surgeon: Cephus Shelling, MD;  Location: [facility] OR;  Service: Vascular;  Laterality: Left;   BIOPSY  12/29/2021   Procedure: BIOPSY;  Surgeon: Meryl Dare, MD;  Location: [facility] ENDOSCOPY;  Service: Gastroenterology;;   CHOLECYSTECTOMY N/A 06/30/2017   Procedure: LAPAROSCOPIC CHOLECYSTECTOMY WITH INTRAOPERATIVE CHOLANGIOGRAM;  Surgeon: Glenna Fellows, MD;  Location: [facility] ORS;  Service: General;  Laterality: N/A;   ESOPHAGOGASTRODUODENOSCOPY (EGD) WITH PROPOFOL N/A 07/04/2017   Procedure: ESOPHAGOGASTRODUODENOSCOPY (EGD) WITH PROPOFOL;  Surgeon: Vida Rigger, MD;  Location: [facility];  Service: Endoscopy;  Laterality: N/A;   ESOPHAGOGASTRODUODENOSCOPY (EGD) WITH PROPOFOL N/A 08/10/2020   Procedure: ESOPHAGOGASTRODUODENOSCOPY (EGD)  WITH PROPOFOL;  Surgeon: Sherrilyn Rist, MD;  Location: [facility] ENDOSCOPY;  Service: Gastroenterology;  Laterality: N/A;   ESOPHAGOGASTRODUODENOSCOPY (EGD) WITH PROPOFOL N/A 01/19/2021   Procedure: ESOPHAGOGASTRODUODENOSCOPY (EGD) WITH PROPOFOL;  Surgeon: Iva Boop, MD;  Location: [facility];  Service: Endoscopy;  Laterality: N/A;  WITH FLUOROSCOPY AND DILATION   ESOPHAGOGASTRODUODENOSCOPY (EGD) WITH PROPOFOL N/A 02/03/2021   Procedure: ESOPHAGOGASTRODUODENOSCOPY (EGD) WITH PROPOFOL;  Surgeon: Iva Boop, MD;  Location: [facility];  Service: Endoscopy;  Laterality: N/A;   ESOPHAGOGASTRODUODENOSCOPY (EGD) WITH PROPOFOL N/A 02/10/2021   Procedure: ESOPHAGOGASTRODUODENOSCOPY (EGD) WITH PROPOFOL;  Surgeon: Hilarie Fredrickson, MD;  Location: [facility];  Service: Endoscopy;  Laterality: N/A;  Balloon Dilation   ESOPHAGOGASTRODUODENOSCOPY (EGD) WITH PROPOFOL N/A 02/19/2021   Procedure: ESOPHAGOGASTRODUODENOSCOPY (EGD) WITH PROPOFOL;  Surgeon: Imogene Burn, MD;  Location: [facility];  Service: Gastroenterology;  Laterality: N/A;   ESOPHAGOGASTRODUODENOSCOPY (EGD) WITH PROPOFOL N/A 03/12/2021   Procedure: ESOPHAGOGASTRODUODENOSCOPY (EGD) WITH PROPOFOL;  Surgeon: Shellia Cleverly, DO;  Location: [facility];  Service: Gastroenterology;  Laterality: N/A;   ESOPHAGOGASTRODUODENOSCOPY (  EGD) WITH PROPOFOL N/A 12/29/2021   Procedure: ESOPHAGOGASTRODUODENOSCOPY (EGD) WITH PROPOFOL;  Surgeon: Meryl Dare, MD;  Location: [facility] ENDOSCOPY;  Service: Gastroenterology;  Laterality: N/A;   FLEXIBLE SIGMOIDOSCOPY N/A 12/29/2021   Procedure: FLEXIBLE SIGMOIDOSCOPY;  Surgeon: Meryl Dare, MD;  Location: [facility] ENDOSCOPY;  Service: Gastroenterology;  Laterality: N/A;   FOREIGN BODY REMOVAL N/A 02/03/2021   Procedure: FOREIGN BODY REMOVAL;  Surgeon: Iva Boop, MD;  Location: [facility];  Service: Endoscopy;  Laterality: N/A;   INSERTION OF DIALYSIS CATHETER N/A 03/20/2018   Procedure: INSERTION OF  TUNNELED DIALYSIS CATHETER - RIGHT INTERANL JUGULAR PLACEMENT;  Surgeon: Chuck Hint, MD;  Location: [facility] OR;  Service: Vascular;  Laterality: N/A;   IR FLUORO GUIDE CV LINE RIGHT  04/18/2020   IR GUIDED DRAIN W CATHETER PLACEMENT  10/28/2018   KIDNEY TRANSPLANT  2007   KIDNEY TRANSPLANT Right    PARATHYROIDECTOMY  ?2012   "3/4 removed" (10/24/2017)   RENAL BIOPSY Bilateral 2003   REVISON OF ARTERIOVENOUS FISTULA Left 09/24/2020   Procedure: LEFT UPPER ARM ARTERIOVENOUS GRAFT CREATION;  Surgeon: Cephus Shelling, MD;  Location: [facility] OR;  Service: Vascular;  Laterality: Left;   UPPER EXTREMITY VENOGRAPHY Bilateral 10/19/2018   Procedure: UPPER EXTREMITY VENOGRAPHY;  Surgeon: Cephus Shelling, MD;  Location: [facility];  Service: Cardiovascular;  Laterality: Bilateral;  Bilateral    UPPER EXTREMITY VENOGRAPHY Left 09/04/2020   Procedure: UPPER EXTREMITY VENOGRAPHY - Left Upper;  Surgeon: Cephus Shelling, MD;  Location: [facility];  Service: Cardiovascular;  Laterality: Left;    Family History  Problem Relation Age of Onset   Arthritis Mother    Hypertension Mother    Aneurysm Mother        died of brain aneurysm   CAD Father        Has 3 stents   Diabetes Father        borderline   Early death Brother        Died in war   Colon cancer Neg Hx    Esophageal cancer Neg Hx    Rectal cancer Neg Hx    Stomach cancer Neg Hx     Social History   Tobacco Use   Smoking status: Never    Passive exposure: Never   Smokeless tobacco: Never  Vaping Use   Vaping status: Never Used  Substance Use Topics   Alcohol use: Not Currently   Drug use: Never    ROS Refer to HPI for ROS details.  Objective:   Vitals: BP (!) 149/113   Pulse (!) 115   Temp 98.1 F (36.7 C)   Resp (!) 22   SpO2 100%   Physical Exam Vitals and nursing note reviewed.  Constitutional:      General: She is not in acute distress.    Appearance: She is ill-appearing. She is not  toxic-appearing.  Cardiovascular:     Rate and Rhythm: Normal rate.  Pulmonary:     Effort: Pulmonary effort is normal.  Abdominal:     Tenderness: There is generalized abdominal tenderness.  Skin:    General: Skin is warm and dry.  Neurological:     General: No focal deficit present.     Mental Status: She is alert and oriented to person, place, and time.  Psychiatric:        Mood and Affect: Mood normal.     Procedures  Results for orders placed or performed during the hospital  encounter of 06/06/23 (from the past 24 hours)  CBC with Differential     Status: Abnormal   Collection Time: 06/06/23  5:48 PM  Result Value Ref Range   WBC 5.1 4.0 - 10.5 K/uL   RBC 3.78 (L) 3.87 - 5.11 MIL/uL   Hemoglobin 10.8 (L) 12.0 - 15.0 g/dL   HCT 62.9 (L) 52.8 - 41.3 %   MCV 89.7 80.0 - 100.0 fL   MCH 28.6 26.0 - 34.0 pg   MCHC 31.9 30.0 - 36.0 g/dL   RDW 24.4 01.0 - 27.2 %   Platelets 202 150 - 400 K/uL   nRBC 0.0 0.0 - 0.2 %   Neutrophils Relative % 76 %   Neutro Abs 3.8 1.7 - 7.7 K/uL   Lymphocytes Relative 11 %   Lymphs Abs 0.5 (L) 0.7 - 4.0 K/uL   Monocytes Relative 9 %   Monocytes Absolute 0.5 0.1 - 1.0 K/uL   Eosinophils Relative 2 %   Eosinophils Absolute 0.1 0.0 - 0.5 K/uL   Basophils Relative 2 %   Basophils Absolute 0.1 0.0 - 0.1 K/uL   Immature Granulocytes 0 %   Abs Immature Granulocytes 0.02 0.00 - 0.07 K/uL  Comprehensive metabolic panel     Status: Abnormal   Collection Time: 06/06/23  5:48 PM  Result Value Ref Range   Sodium 134 (L) 135 - 145 mmol/L   Potassium 5.0 3.5 - 5.1 mmol/L   Chloride 102 98 - 111 mmol/L   CO2 24 22 - 32 mmol/L   Glucose, Bld 316 (H) 70 - 99 mg/dL   BUN 27 (H) 6 - 20 mg/dL   Creatinine, Ser 5.36 (H) 0.44 - 1.00 mg/dL   Calcium 8.9 8.9 - 64.4 mg/dL   Total Protein 8.9 (H) 6.5 - 8.1 g/dL   Albumin 4.5 3.5 - 5.0 g/dL   AST 12 (L) 15 - 41 U/L   ALT 12 0 - 44 U/L   Alkaline Phosphatase 78 38 - 126 U/L   Total Bilirubin 0.5 0.0 - 1.2  mg/dL   GFR, Estimated 44 (L) >60 mL/min   Anion gap 8 5 - 15  Lipase, blood     Status: None   Collection Time: 06/06/23  5:48 PM  Result Value Ref Range   Lipase 39 11 - 51 U/L  hCG, serum, qualitative     Status: None   Collection Time: 06/06/23  5:48 PM  Result Value Ref Range   Preg, Serum NEGATIVE NEGATIVE  Gastrointestinal Panel by PCR , Stool     Status: Abnormal   Collection Time: 06/06/23  8:35 PM   Specimen: Urine, Clean Catch; Stool  Result Value Ref Range   Campylobacter species NOT DETECTED NOT DETECTED   Plesimonas shigelloides NOT DETECTED NOT DETECTED   Salmonella species NOT DETECTED NOT DETECTED   Yersinia enterocolitica NOT DETECTED NOT DETECTED   Vibrio species NOT DETECTED NOT DETECTED   Vibrio cholerae NOT DETECTED NOT DETECTED   Enteroaggregative E coli (EAEC) NOT DETECTED NOT DETECTED   Enteropathogenic E coli (EPEC) NOT DETECTED NOT DETECTED   Enterotoxigenic E coli (ETEC) NOT DETECTED NOT DETECTED   Shiga like toxin producing E coli (STEC) NOT DETECTED NOT DETECTED   Shigella/Enteroinvasive E coli (EIEC) NOT DETECTED NOT DETECTED   Cryptosporidium NOT DETECTED NOT DETECTED   Cyclospora cayetanensis NOT DETECTED NOT DETECTED   Entamoeba histolytica NOT DETECTED NOT DETECTED   Giardia lamblia NOT DETECTED NOT DETECTED   Adenovirus F40/41 NOT DETECTED  NOT DETECTED   Astrovirus NOT DETECTED NOT DETECTED   Norovirus GI/GII DETECTED (A) NOT DETECTED   Rotavirus A NOT DETECTED NOT DETECTED   Sapovirus (I, II, IV, and V) NOT DETECTED NOT DETECTED  C Difficile Quick Screen w PCR reflex     Status: Abnormal   Collection Time: 06/06/23  8:35 PM   Specimen: Urine, Clean Catch; Stool  Result Value Ref Range   C Diff antigen POSITIVE (A) NEGATIVE   C Diff toxin NEGATIVE NEGATIVE   C Diff interpretation Results are indeterminate. See PCR results.   C. Diff by PCR, Reflexed     Status: Abnormal   Collection Time: 06/06/23  8:35 PM  Result Value Ref Range    Toxigenic C. Difficile by PCR POSITIVE (A) NEGATIVE  Urinalysis, Routine w reflex microscopic -Urine, Clean Catch     Status: Abnormal   Collection Time: 06/06/23  8:48 PM  Result Value Ref Range   Color, Urine YELLOW YELLOW   APPearance CLEAR CLEAR   Specific Gravity, Urine 1.020 1.005 - 1.030   pH 5.5 5.0 - 8.0   Glucose, UA >1,000 (A) NEGATIVE mg/dL   Hgb urine dipstick TRACE (A) NEGATIVE   Bilirubin Urine NEGATIVE NEGATIVE   Ketones, ur NEGATIVE NEGATIVE mg/dL   Protein, ur TRACE (A) NEGATIVE mg/dL   Nitrite NEGATIVE NEGATIVE   Leukocytes,Ua NEGATIVE NEGATIVE   RBC / HPF 0-5 0 - 5 RBC/hpf   WBC, UA 0-5 0 - 5 WBC/hpf   Bacteria, UA RARE (A) NONE SEEN   Squamous Epithelial / HPF 0-5 0 - 5 /HPF   Hyaline Casts, UA PRESENT    *Note: Due to a large number of results and/or encounters for the requested time period, some results have not been displayed. A complete set of results can be found in Results Review.    Assessment and Plan :   PDMP not reviewed this encounter.  1. Generalized abdominal pain   2. Nausea vomiting and diarrhea    Patient advised to follow-up in emergency department for further evaluation, IV medications for vomiting and oral C. difficile antibiotics as medications are not available at her pharmacy.  Patient reports she is able to make it to the emergency department herself and will seek medical care after leaving urgent care.  Lucky Cowboy   [place], [place], [place] 06/07/23 1521

## 2023-06-07 NOTE — ED Provider Notes (Signed)
 [facility] Provider Note   CSN: [id number] Arrival date & time: 06/07/23  1522    History  Chief Complaint  Patient presents with   Nausea   Emesis   Abdominal Pain    [name] is a 38 y.o. female here for nausea, vomiting and diarrhea.  I actually saw patient about a month ago with septic shock due to pyelonephritis and her transplanted kidney.  She had a kidney transplant 12/24.  She follows with [facility].  After discharge she was started on 4 weeks of Levaquin as well as amoxicillin.  She did have some diarrhea while hospitalized however was negative for acute pathology.  She was seen yesterday due to worsening diarrhea as well as nausea and vomiting.  She had a C. difficile study which was positive.  She states she has not been able to keep down her home meds.  Had not started the medications prescribed to her yesterday as he stated the pharmacy told her they needed to order them and they would be in on Wednesday.  She is using Zofran at home without relief.  No fever, chest pain, shortness of breath, back pain, dysuria, hematuria.  HPI     Home Medications Prior to Admission medications   Medication Sig Start Date End Date Taking? Authorizing Provider  docusate sodium (COLACE) 100 MG capsule Take 1 capsule (100 mg total) by mouth 2 (two) times daily as needed for mild constipation. 05/08/23   Assaker, West Bali, MD  fidaxomicin (DIFICID) 200 MG TABS tablet Take 1 tablet (200 mg total) by mouth 2 (two) times daily. 06/06/23   Rondel Baton, MD  heparin 95284 UT/250ML infusion Inject 1,100 Units/hr into the vein continuous. 05/08/23   Assaker, [place] Bali, MD  hydrocortisone sodium succinate (SOLU-CORTEF) 100 MG injection Inject 2 mLs (100 mg total) into the vein every 8 (eight) hours. 05/08/23   Assaker, West Bali, MD  insulin regular, human (MYXREDLIN) 100 units/ 100 mL infusion Inject 10 Units/hr into the vein continuous. 05/08/23    Assaker, West Bali, MD  meropenem 1 g in sodium chloride 0.9 % 100 mL Inject 1 g into the vein 2 (two) times daily. 05/08/23   Assaker, West Bali, MD  Mouthwashes (MOUTH RINSE) LIQD solution 15 mLs by Mouth Rinse route as needed (for oral care). 05/08/23   Assaker, West Bali, MD  norepinephrine (LEVOPHED) 4-5 MG/250ML-% SOLN Inject 0-40 mcg/min into the vein continuous. 05/08/23   Assaker, West Bali, MD  ondansetron [facility]) 4 MG/2ML SOLN injection Inject 2 mLs (4 mg total) into the vein every 6 (six) hours as needed for nausea or vomiting. 05/08/23   Assaker, West Bali, MD  ondansetron (ZOFRAN-ODT) 4 MG disintegrating tablet Take 1 tablet (4 mg total) by mouth every 8 (eight) hours as needed for nausea or vomiting. 06/06/23   Rondel Baton, MD  pantoprazole (PROTONIX) 40 MG injection Inject 40 mg into the vein daily. 05/08/23   Assaker, West Bali, MD  polyethylene glycol (MIRALAX / GLYCOLAX) 17 g packet Take 17 g by mouth daily as needed for moderate constipation. 05/08/23   Assaker, West Bali, MD      Allergies    Diphenhydramine, Peanut-containing drug products, Contrast media [iodinated contrast media], Shellfish allergy, Chlorhexidine, Ferrous sulfate, and Iron dextran    Review of Systems   Review of Systems  Constitutional: Negative.   HENT: Negative.    Respiratory: Negative.    Cardiovascular: Negative.   Gastrointestinal:  Positive for abdominal pain, diarrhea, nausea and vomiting.  Negative for abdominal distention, anal bleeding, blood in stool, constipation and rectal pain.  Genitourinary: Negative.   Musculoskeletal: Negative.   Skin: Negative.   Neurological: Negative.   All other systems reviewed and are negative.   Physical Exam Updated Vital Signs BP 118/87   Pulse (!) 105   Temp 98.6 F (37 C)   Resp (!) 23   Ht 5\' 6"  (1.676 m)   Wt 86.2 kg   SpO2 100%   BMI 30.67 kg/m  Physical Exam Vitals and nursing note reviewed.  Constitutional:       General: She is not in acute distress.    Appearance: She is well-developed. She is not ill-appearing, toxic-appearing or diaphoretic.  HENT:     Head: Atraumatic.  Eyes:     Pupils: Pupils are equal, round, and reactive to light.  Cardiovascular:     Rate and Rhythm: Tachycardia present.     Heart sounds: Normal heart sounds.  Pulmonary:     Effort: Pulmonary effort is normal. No respiratory distress.     Breath sounds: Normal breath sounds.  Abdominal:     General: Bowel sounds are normal. There is no distension.     Palpations: Abdomen is soft.     Tenderness: There is generalized abdominal tenderness. There is no right CVA tenderness, left CVA tenderness, guarding or rebound.     Comments: Mild diffuse tenderness, no rebound or guarding  Musculoskeletal:        General: Normal range of motion.     Cervical back: Normal range of motion.  Skin:    General: Skin is warm and dry.  Neurological:     General: No focal deficit present.     Mental Status: She is alert.  Psychiatric:        Mood and Affect: Mood normal.     ED Results / Procedures / Treatments   Labs (all labs ordered are listed, but only abnormal results are displayed) Labs Reviewed  COMPREHENSIVE METABOLIC PANEL - Abnormal; Notable for the following components:      Result Value   Sodium 134 (*)    CO2 20 (*)    Glucose, Bld 323 (*)    BUN 22 (*)    Creatinine, Ser 1.50 (*)    Calcium 8.6 (*)    Total Protein 8.8 (*)    GFR, Estimated 45 (*)    All other components within normal limits  CBC - Abnormal; Notable for the following components:   RBC 3.72 (*)    Hemoglobin 10.6 (*)    HCT 34.6 (*)    All other components within normal limits  URINALYSIS, ROUTINE W REFLEX MICROSCOPIC - Abnormal; Notable for the following components:   Glucose, UA >=500 (*)    All other components within normal limits  CULTURE, BLOOD (ROUTINE X 2)  CULTURE, BLOOD (ROUTINE X 2)  LIPASE, BLOOD  HCG, SERUM, QUALITATIVE   TACROLIMUS LEVEL  I-STAT CG4 LACTIC ACID, ED    EKG None  Radiology CT ABDOMEN PELVIS WO CONTRAST Result Date: 06/06/2023 CLINICAL DATA:  Abdominal pain with nausea and vomiting. Renal transplant. EXAM: CT ABDOMEN AND PELVIS WITHOUT CONTRAST TECHNIQUE: Multidetector CT imaging of the abdomen and pelvis was performed following the standard protocol without IV contrast. RADIATION DOSE REDUCTION: This exam was performed according to the departmental dose-optimization program which includes automated exposure control, adjustment of the mA and/or kV according to patient size and/or use of iterative reconstruction technique. COMPARISON:  CT abdomen and pelvis  05/08/2023 FINDINGS: Lower chest: No acute abnormality. Hepatobiliary: No focal liver abnormality is seen. Status post cholecystectomy. No biliary dilatation. Pancreas: Unremarkable. No pancreatic ductal dilatation or surrounding inflammatory changes. Spleen: Normal in size without focal abnormality. Adrenals/Urinary Tract: Solitary native left kidney demonstrates atrophy, unchanged. Bilateral adrenal glands are within normal limits. There is a transplanted left lower quadrant kidney there is prominence of the left renal collecting system without obstructing calculus similar to prior. No significant perinephric stranding or perinephric fluid collection. The bladder is within normal limits. Stomach/Bowel: Stomach is within normal limits. Appendix appears normal. No evidence of bowel wall thickening, distention, or inflammatory changes. Vascular/Lymphatic: No significant vascular findings are present. No enlarged abdominal or pelvic lymph nodes. Reproductive: Status post hysterectomy. No adnexal masses. Other: There is scarring in the lower anterior abdominal wall. Low-density collection in the subcutaneous tissues of the left anterior abdominal wall measures 3.2 x 3.9 cm and appears unchanged. There is no abdominal wall hernia or ascites. Musculoskeletal:  No fracture is seen. IMPRESSION: 1. No acute localizing process in the abdomen or pelvis. 2. Transplanted left lower quadrant kidney with prominence of the left renal collecting system without obstructing calculus, similar to prior. 3. Stable low-density collection in the subcutaneous tissues of the left anterior abdominal wall. Electronically Signed   By: Darliss Cheney M.D.   On: 06/06/2023 20:34    Procedures Procedures    Medications Ordered in ED Medications  promethazine (PHENERGAN) 12.5 mg in sodium chloride 0.9 % 50 mL IVPB (has no administration in time range)  sodium chloride 0.9 % bolus 1,000 mL (0 mLs Intravenous Stopped 06/07/23 1942)  ondansetron (ZOFRAN) injection 4 mg (4 mg Intravenous Given 06/07/23 1752)  morphine (PF) 4 MG/ML injection 4 mg (4 mg Intravenous Given 06/07/23 1753)  ondansetron (ZOFRAN) injection 4 mg (4 mg Intravenous Given 06/07/23 1918)  morphine (PF) 4 MG/ML injection 4 mg (4 mg Intravenous Given 06/07/23 1954)    ED Course/ Medical Decision Making/ A&P Clinical Course as of 06/07/23 2104  Tue Jun 07, 2023  2059 Dr. Margo Aye with medicine to admit [BH]    Clinical Course User Index [BH] Topaz Raglin A, PA-C   38 year old here for evaluation of nausea vomiting and diarrhea.  I actually saw patient 1 month ago for urosepsis with septic shock in her transplanted kidney and she was transferred to [facility].  She subsequently discharged on 4 weeks of Levaquin and amoxicillin.  She was seen yesterday at OSH for diarrhea.  Was found to have C. difficile.  She continues to have nausea vomiting and diarrhea.  She has not started the medication prescribed a previous provider.  I reviewed her CT scan and labs from yesterday.  On exam she is afebrile, nonseptic appearing.  She is tachycardic and dry heaving.  Will plan on repeat labs, symptom management and reassess  Labs personally viewed and interpreted:  CBC without leukocytosis, hemoglobin 10.6 Metabolic panel sodium  134, glucose 323, creatinine 1.5--baseline per last transplant team Lipase 45 Pregnancy test negative Lactic 1.0 C. difficile PCR positive  Discussed results with patient.  Will attempt symptom management.  Patient reassessed.  Still having pain, nausea and vomiting.  She did become hypoxic after getting some pain medicine however improved no longer requiring supplemental oxygen.  She denies any chest pain, shortness of breath or cough.  No pain or welling to lower legs.  Low suspicion for PE.   Patient with persistent nausea and vomiting in setting of known C. difficile colitis.  Will admit for further management.  Cannot keep down her home transplant medications.  Discussed with Dr. Margo Aye with medicine service who is agreeable to evaluate patient for admission  The patient appears reasonably stabilized for admission considering the current resources, flow, and capabilities available in the ED at this time, and I doubt any other [facility] requiring further screening and/or treatment in the ED prior to admission.                                 Medical Decision Making Amount and/or Complexity of Data Reviewed External Data Reviewed: labs, radiology, ECG and notes. Labs: ordered. Decision-making details documented in ED Course.  Risk OTC drugs. Prescription drug management. Parenteral controlled substances. Decision regarding hospitalization. Diagnosis or treatment significantly limited by social determinants of health.         Final Clinical Impression(s) / ED Diagnoses Final diagnoses:  C. difficile diarrhea  Nausea vomiting and diarrhea    Rx / DC Orders ED Discharge Orders     None         Raynald Rouillard A, PA-C 06/07/23 2104    Loetta Rough, MD 06/07/23 2119

## 2023-06-07 NOTE — Progress Notes (Signed)
 [facility] night cross cover note:   I was notified by RN that the patient reports that she is on Eliquis 5 mg p.o. twice daily as an outpatient for treatment of DVT.  She feels that her nausea has improved sufficiently that she will be able to tolerate her evening Eliquis.  Additionally, she requests prn times for indigestion.  Per the above, I have resumed outpatient Eliquis, and discontinued existing order for prophylactic dosing of subcutaneous Lovenox.  Additionally, per patient request, I have ordered as needed Tums for indigestion.     Newton Pigg, DO Hospitalist

## 2023-06-07 NOTE — ED Notes (Signed)
 Patient is being discharged from the Urgent Care and sent to the Emergency Department via POV . Per Vickki Hearing, NP, patient is in need of higher level of care due to severe abdominal pain. Patient is aware and verbalizes understanding of plan of care.  Vitals:   06/07/23 1511  BP: (!) 149/113  Pulse: (!) 115  Resp: (!) 22  Temp: 98.1 F (36.7 C)  SpO2: 100%

## 2023-06-07 NOTE — ED Triage Notes (Signed)
 Pt was seen recently for NVD and was prescribed medications but they will not be available until Wednesday at pharmacy. NV has continued along with ABD pain to RUQ and LUQ. Pt had kidney transplant in December and has been unable to take her antirejection meds for 1 week. Denies urinary symptoms at this time. Denies CP  SOB

## 2023-06-07 NOTE — ED Triage Notes (Addendum)
 Pt presents with continued centralized abdominal pain and emesis. She was seen in the ED last night. Pt does have C. Diff but has not started her medication yet. Pt had kidney transplant in December 2024. Pt also has esophageal hernia that has not been repaired. This pain started last week.

## 2023-06-07 NOTE — ED Notes (Signed)
 Pt's O2 80% on RA after pain medication administration, pt placed on 2lpm [facility]. O2 increased to 100%.

## 2023-06-08 ENCOUNTER — Encounter (HOSPITAL_COMMUNITY): Payer: Self-pay | Admitting: *Deleted

## 2023-06-08 ENCOUNTER — Other Ambulatory Visit (HOSPITAL_COMMUNITY): Payer: Self-pay

## 2023-06-08 DIAGNOSIS — R112 Nausea with vomiting, unspecified: Secondary | ICD-10-CM | POA: Diagnosis not present

## 2023-06-08 LAB — BASIC METABOLIC PANEL
Anion gap: 9 (ref 5–15)
BUN: 21 mg/dL — ABNORMAL HIGH (ref 6–20)
CO2: 23 mmol/L (ref 22–32)
Calcium: 8.2 mg/dL — ABNORMAL LOW (ref 8.9–10.3)
Chloride: 105 mmol/L (ref 98–111)
Creatinine, Ser: 1.48 mg/dL — ABNORMAL HIGH (ref 0.44–1.00)
GFR, Estimated: 46 mL/min — ABNORMAL LOW (ref >60)
Glucose, Bld: 209 mg/dL — ABNORMAL HIGH (ref 70–99)
Potassium: 4.2 mmol/L (ref 3.5–5.1)
Sodium: 137 mmol/L (ref 135–145)

## 2023-06-08 LAB — BLOOD CULTURE ID PANEL (REFLEXED) - BCID2

## 2023-06-08 LAB — GLUCOSE, CAPILLARY
Glucose-Capillary: 396 mg/dL — ABNORMAL HIGH (ref 70–99)
Glucose-Capillary: 264 mg/dL — ABNORMAL HIGH (ref 70–99)
Glucose-Capillary: 235 mg/dL — ABNORMAL HIGH (ref 70–99)

## 2023-06-08 LAB — CBC
HCT: 28.1 % — ABNORMAL LOW (ref 36.0–46.0)
Hemoglobin: 9 g/dL — ABNORMAL LOW (ref 12.0–15.0)
MCH: 28.9 pg (ref 26.0–34.0)
MCHC: 32 g/dL (ref 30.0–36.0)
MCV: 90.4 fL (ref 80.0–100.0)
Platelets: 153 10*3/uL (ref 150–400)
RBC: 3.11 MIL/uL — ABNORMAL LOW (ref 3.87–5.11)
RDW: 15.3 % (ref 11.5–15.5)
WBC: 4.2 10*3/uL (ref 4.0–10.5)
nRBC: 0 % (ref 0.0–0.2)

## 2023-06-08 LAB — PHOSPHORUS: Phosphorus: 3.3 mg/dL (ref 2.5–4.6)

## 2023-06-08 LAB — TACROLIMUS LEVEL: Tacrolimus (FK506) - LabCorp: 1.9 ng/mL — ABNORMAL LOW (ref 5.0–20.0)

## 2023-06-08 LAB — MAGNESIUM: Magnesium: 1.5 mg/dL — ABNORMAL LOW (ref 1.7–2.4)

## 2023-06-08 MED ORDER — TACROLIMUS 1 MG PO CAPS
5.0000 mg | ORAL_CAPSULE | Freq: Two times a day (BID) | ORAL | Status: DC
  Administered 2023-06-08 – 2023-06-10 (×4): 5 mg via ORAL
  Filled 2023-06-08 (×4): qty 5

## 2023-06-08 MED ORDER — PREDNISONE 5 MG PO TABS
5.0000 mg | ORAL_TABLET | Freq: Every day | ORAL | Status: DC
  Administered 2023-06-08 – 2023-06-10 (×3): 5 mg via ORAL
  Filled 2023-06-08 (×3): qty 1

## 2023-06-08 MED ORDER — VALGANCICLOVIR HCL 450 MG PO TABS
450.0000 mg | ORAL_TABLET | Freq: Every day | ORAL | Status: DC
  Administered 2023-06-08 – 2023-06-10 (×3): 450 mg via ORAL
  Filled 2023-06-08 (×3): qty 1

## 2023-06-08 MED ORDER — TACROLIMUS 1 MG PO CAPS
5.0000 mg | ORAL_CAPSULE | Freq: Two times a day (BID) | ORAL | Status: DC
  Administered 2023-06-08 (×2): 5 mg via ORAL
  Filled 2023-06-08 (×2): qty 5

## 2023-06-08 MED ORDER — INSULIN ASPART 100 UNIT/ML IJ SOLN
0.0000 [IU] | Freq: Three times a day (TID) | INTRAMUSCULAR | Status: DC
  Administered 2023-06-08: 3 [IU] via SUBCUTANEOUS
  Administered 2023-06-08: 5 [IU] via SUBCUTANEOUS

## 2023-06-08 MED ORDER — MYCOPHENOLATE MOFETIL 250 MG PO CAPS
750.0000 mg | ORAL_CAPSULE | Freq: Two times a day (BID) | ORAL | Status: DC
  Administered 2023-06-08 (×2): 750 mg via ORAL
  Filled 2023-06-08 (×2): qty 3

## 2023-06-08 MED ORDER — INSULIN GLARGINE-YFGN 100 UNIT/ML ~~LOC~~ SOLN
8.0000 [IU] | SUBCUTANEOUS | Status: DC
  Administered 2023-06-08: 8 [IU] via SUBCUTANEOUS
  Filled 2023-06-08 (×2): qty 0.08

## 2023-06-08 MED ORDER — MYCOPHENOLATE MOFETIL 250 MG PO CAPS
250.0000 mg | ORAL_CAPSULE | Freq: Two times a day (BID) | ORAL | Status: DC
  Administered 2023-06-08 – 2023-06-10 (×4): 250 mg via ORAL
  Filled 2023-06-08 (×4): qty 1

## 2023-06-08 MED ORDER — MAGNESIUM SULFATE 2 GM/50ML IV SOLN
2.0000 g | Freq: Once | INTRAVENOUS | Status: AC
  Administered 2023-06-08: 2 g via INTRAVENOUS
  Filled 2023-06-08: qty 50

## 2023-06-08 MED ORDER — INSULIN ASPART 100 UNIT/ML IJ SOLN: 0.0000 [IU] | Freq: Three times a day (TID) | INTRAMUSCULAR | Status: DC

## 2023-06-08 MED ORDER — SULFAMETHOXAZOLE-TRIMETHOPRIM 400-80 MG PO TABS
1.0000 | ORAL_TABLET | ORAL | Status: DC
  Administered 2023-06-08 – 2023-06-10 (×2): 1 via ORAL
  Filled 2023-06-08 (×2): qty 1

## 2023-06-08 MED ORDER — ALUM & MAG HYDROXIDE-SIMETH 200-200-20 MG/5ML PO SUSP: 30.0000 mL | ORAL | Status: DC | PRN

## 2023-06-08 MED ORDER — FAMOTIDINE IN NACL 20-0.9 MG/50ML-% IV SOLN
20.0000 mg | Freq: Two times a day (BID) | INTRAVENOUS | Status: DC
  Administered 2023-06-08 – 2023-06-09 (×3): 20 mg via INTRAVENOUS
  Filled 2023-06-08 (×5): qty 50

## 2023-06-08 MED ORDER — INSULIN ASPART 100 UNIT/ML IJ SOLN
0.0000 [IU] | Freq: Every day | INTRAMUSCULAR | Status: DC
  Administered 2023-06-08: 2 [IU] via SUBCUTANEOUS
  Administered 2023-06-09: 4 [IU] via SUBCUTANEOUS

## 2023-06-08 NOTE — Progress Notes (Signed)
 Patient informed of transfer to [facility] for closer management by primary nephrology team. Patient refused transfer and said she will not be changing facilities.   Sharolyn Douglas MD and Velna Ochs MD made aware of patient decision.

## 2023-06-08 NOTE — Plan of Care (Signed)
  Problem: Education: Goal: Knowledge of disease and its progression will improve Outcome: Not Progressing   Problem: Health Behavior/Discharge Planning: Goal: Ability to manage health-related needs will improve Outcome: Not Progressing   Problem: Nutritional: Goal: Ability to make healthy dietary choices will improve Outcome: Not Progressing

## 2023-06-08 NOTE — Consult Note (Addendum)
 [facility] Renal Consultation Note  Requesting MD: Dow Adolph, MD Indication for Consultation:  Left renal transplant  Chief complaint: Stomach pain, Nausea/Vomiting  HPI:  [name] is a 38 y.o. female who presented with intractable nausea/vomiting and abdominal pain. 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 Born with solitary kidney. 1st transplant 2007.  On admission she test positive for C diff. Had normal CT abdomen and pelvis without contrast. She was also positive for norovirus. Was unable to keep down immunosuppressives or Dificid. Receive fluid resuscitation in the ED. Restarted on immunosuppressive and treatment for C. Diff.  No documented fever. Mild tachycardia. No hypotension. Meds: CellCept, Tacrolimus, Mycophenolate, Valgancylcovir, Bactrim. Creatinine 1.54->1.50->1.48. BUN 27->22->21. No documented output/input. UA significant for glucose >500.  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 Reports that her nausea and vomiting has improved with antiemetics. Had up to 10 episodes of vomiting yesterday. NBNB. Is able to keep down her pills. Has also had non-bloody watery diarrhea. X2 episodes today.  More yesterday. Feels she is mildly dehydrated and is not drinking as much as she normally does. States she is urinating normally. Reports she is taking Jardiance. She is unsure if it was stopped or not.  Creat  Date/Time Value Ref Range Status  03/23/2022 03:00 PM 6.17 (H) 0.50 - 0.97 mg/dL Final    Comment:    Verified by repeat analysis. Marland Kitchen   04/17/2020 10:35 AM 7.77 (H) 0.50 - 1.10 mg/dL Final    Comment:    Verified by repeat analysis. .    Creatinine, Ser  Date/Time Value Ref Range Status  06/08/2023 04:09 AM 1.48 (H)  0.44 - 1.00 mg/dL Final  60/12/9321 55:73 PM 1.50 (H) 0.44 - 1.00 mg/dL Final  22/04/5425 06:23 PM 1.54 (H) 0.44 - 1.00 mg/dL Final  76/28/3151 76:16 AM 2.26 (H) 0.44 - 1.00 mg/dL Final  07/37/1062 69:48 PM 1.90 (H) 0.44 - 1.00 mg/dL Final  54/62/7035 00:93 PM 7.10 (H) 0.44 - 1.00 mg/dL Final  81/82/9937 16:96 AM 5.78 (H) 0.44 - 1.00 mg/dL Final  78/93/8101 75:10 PM 5.64 (H) 0.44 - 1.00 mg/dL Final  25/85/2778 24:23 PM 4.46 (H) 0.44 - 1.00 mg/dL Final  53/61/4431 54:00 AM 6.49 (H) 0.44 - 1.00 mg/dL Final  86/76/1950 93:26 AM 11.12 (H) 0.44 - 1.00 mg/dL Final  71/24/5809 98:33 PM 9.61 (H) 0.44 - 1.00 mg/dL Final  82/50/5397 67:34 AM 6.51 (H) 0.44 - 1.00 mg/dL Final  19/37/9024 09:73 AM 10.12 (H) 0.44 - 1.00 mg/dL Final  53/29/9242 68:34 AM 9.07 (H) 0.44 - 1.00 mg/dL Final  19/62/2297 98:92 AM 9.07 (H) 0.44 - 1.00 mg/dL Final  11/94/1740 81:44 AM 5.96 (H) 0.44 - 1.00 mg/dL Final  81/85/6314 97:02 AM 9.04 (H) 0.44 - 1.00 mg/dL Final  63/78/5885 02:77 AM 9.00 (H) 0.44 - 1.00 mg/dL Final  41/28/7867 67:20 PM 8.30 (H) 0.44 - 1.00 mg/dL Final  94/70/9628 36:62 AM 7.01 (H) 0.44 - 1.00 mg/dL Final  94/76/5465 03:54 AM 10.69 (H) 0.44 - 1.00 mg/dL Final  65/68/1275 17:00 AM 8.76 (H) 0.44 - 1.00 mg/dL Final  17/49/4496 75:91 AM 11.03 (H) 0.44 - 1.00 mg/dL Final  63/84/6659 93:57 PM 9.44 (H) 0.44 - 1.00 mg/dL Final  01/77/9390 30:09 AM 5.69 (H) 0.44 - 1.00 mg/dL Final  23/30/0762 26:33 AM 10.46 (H) 0.44 -  1.00 mg/dL Final  21/30/8657 84:69 AM 8.25 (H) 0.44 - 1.00 mg/dL Final  62/95/2841 32:44 AM 10.00 (H) 0.44 - 1.00 mg/dL Final  03/17/7251 66:44 AM 8.81 (H) 0.44 - 1.00 mg/dL Final  03/47/4259 56:38 AM 8.42 (H) 0.44 - 1.00 mg/dL Final  75/64/3329 51:88 AM 6.53 (H) 0.44 - 1.00 mg/dL Final  41/66/0630 16:01 PM 4.95 (H) 0.44 - 1.00 mg/dL Final  09/32/3557 32:20 AM 8.98 (H) 0.44 - 1.00 mg/dL Final  25/42/7062 37:62 AM 9.94 (H) 0.44 - 1.00 mg/dL Final  83/15/1761 60:73 AM 8.76 (H) 0.44 - 1.00 mg/dL  Final  71/08/2692 85:46 PM 5.33 (H) 0.44 - 1.00 mg/dL Final  27/05/5007 38:18 PM 8.25 (H) 0.44 - 1.00 mg/dL Final  29/93/7169 67:89 AM 4.95 (H) 0.44 - 1.00 mg/dL Final  38/12/1749 02:58 PM 6.60 (H) 0.44 - 1.00 mg/dL Final  52/77/8242 35:36 PM 4.67 (H) 0.44 - 1.00 mg/dL Final  14/43/1540 08:67 AM 8.75 (H) 0.44 - 1.00 mg/dL Final  61/95/0932 67:12 AM 6.72 (H) 0.44 - 1.00 mg/dL Final  45/80/9983 38:25 PM 5.52 (H) 0.44 - 1.00 mg/dL Final  05/39/7673 41:93 PM 8.01 (H) 0.44 - 1.00 mg/dL Final  79/04/4095 35:32 AM 6.00 (H) 0.44 - 1.00 mg/dL Final  99/24/2683 41:96 AM 5.18 (H) 0.44 - 1.00 mg/dL Final  22/29/7989 21:19 AM 6.10 (H) 0.44 - 1.00 mg/dL Final  41/74/0814 48:18 AM 5.79 (H) 0.44 - 1.00 mg/dL Final  56/31/4970 26:37 PM 8.10 (H) 0.44 - 1.00 mg/dL Final  85/88/5027 74:12 PM 5.00 (H) 0.44 - 1.00 mg/dL Final  87/86/7672 09:47 AM 6.72 (H) 0.44 - 1.00 mg/dL Final     PMHx:   Past Medical History:  Diagnosis Date   Anemia    Blood transfusion without reported diagnosis    Cellulitis of left leg 03/01/2018   Chronic kidney disease    kidney transplant 07   Diabetes mellitus    Pt reports diagnosis in June 2011, Type 2   Diabetes mellitus without complication (HCC)    Esophageal obstruction due to food impaction    GERD (gastroesophageal reflux disease)    Hyperlipidemia    Hypertension    Intra-abdominal abscess (HCC) 10/28/2018   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   LEARNING DISABILITY 09/25/2007   Qualifier: Diagnosis of  By: Dayton Martes MD, Talia     Nausea and vomiting    Prolonged Q-T interval on ECG    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   Pyelonephritis 06/23/2014   Renal and perinephric abscess 11/01/2018   Renal disorder    Seasonal allergies    Seizures (HCC)    UTI (urinary tract infection) 01/09/2015   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   Qualifier: Diagnosis of  By: Sandi Mealy  MD, Judeth Cornfield      Past Surgical History:  Procedure Laterality Date   A/V FISTULAGRAM Left  02/24/2023   Procedure: A/V Fistulagram;  Surgeon: Cephus Shelling, MD;  Location: [facility] INV[facility]IVE [facility];  Service: Cardiovascular;  Laterality: Left;   AMPUTATION Left 04/02/2022   Procedure: AMPUTATION LEFT GREAT TOE;  Surgeon: Nadara Mustard, MD;  Location: [facility] OR;  Service: Orthopedics;  Laterality: Left;   ARTERIOVENOUS GRAFT PLACEMENT Bilateral    "neither work" (10/24/2017)   AV FISTULA PLACEMENT Left 10/26/2018   Procedure: CREATION OF ARTERIOVENOUS FISTULA  LEFT ARM;  Surgeon: Cephus Shelling, MD;  Location: [facility] OR;  Service: Vascular;  Laterality: Left;   AV FISTULA PLACEMENT Left 05/23/2020   Procedure: LEFT ARM ARTERIOVENOUS (AV) FISTULA CREATION;  Surgeon: Cephus Shelling, MD;  Location: [facility] OR;  Service: Vascular;  Laterality: Left;   BALLOON DILATION N/A 01/19/2021   Procedure: Marvis Repress DILATION;  Surgeon: Iva Boop, MD;  Location: [facility];  Service: Endoscopy;  Laterality: N/A;   BALLOON DILATION N/A 02/10/2021   Procedure: BALLOON DILATION;  Surgeon: Hilarie Fredrickson, MD;  Location: [facility] ENDOSCOPY;  Service: Endoscopy;  Laterality: N/A;   BALLOON DILATION N/A 02/19/2021   Procedure: BALLOON DILATION;  Surgeon: Imogene Burn, MD;  Location: [facility];  Service: Gastroenterology;  Laterality: N/A;   BASCILIC VEIN TRANSPOSITION Left 12/21/2018   Procedure: Left arm BASILIC VEIN TRANSPOSITION SECOND STAGE;  Surgeon: Cephus Shelling, MD;  Location: [facility] OR;  Service: Vascular;  Laterality: Left;   BIOPSY  12/29/2021   Procedure: BIOPSY;  Surgeon: Meryl Dare, MD;  Location: [facility] ENDOSCOPY;  Service: Gastroenterology;;   CHOLECYSTECTOMY N/A 06/30/2017   Procedure: LAPAROSCOPIC CHOLECYSTECTOMY WITH INTRAOPERATIVE CHOLANGIOGRAM;  Surgeon: Glenna Fellows, MD;  Location: [facility] ORS;  Service: General;  Laterality: N/A;   ESOPHAGOGASTRODUODENOSCOPY (EGD) WITH PROPOFOL N/A 07/04/2017   Procedure: ESOPHAGOGASTRODUODENOSCOPY (EGD) WITH PROPOFOL;  Surgeon: Vida Rigger, MD;   Location: [facility]OS[facility];  Service: Endoscopy;  Laterality: N/A;   ESOPHAGOGASTRODUODENOSCOPY (EGD) WITH PROPOFOL N/A 08/10/2020   Procedure: ESOPHAGOGASTRODUODENOSCOPY (EGD) WITH PROPOFOL;  Surgeon: Sherrilyn Rist, MD;  Location: [facility] ENDOSCOPY;  Service: Gastroenterology;  Laterality: N/A;   ESOPHAGOGASTRODUODENOSCOPY (EGD) WITH PROPOFOL N/A 01/19/2021   Procedure: ESOPHAGOGASTRODUODENOSCOPY (EGD) WITH PROPOFOL;  Surgeon: Iva Boop, MD;  Location: [facility];  Service: Endoscopy;  Laterality: N/A;  WITH FLUOROSCOPY AND DILATION   ESOPHAGOGASTRODUODENOSCOPY (EGD) WITH PROPOFOL N/A 02/03/2021   Procedure: ESOPHAGOGASTRODUODENOSCOPY (EGD) WITH PROPOFOL;  Surgeon: Iva Boop, MD;  Location: [facility];  Service: Endoscopy;  Laterality: N/A;   ESOPHAGOGASTRODUODENOSCOPY (EGD) WITH PROPOFOL N/A 02/10/2021   Procedure: ESOPHAGOGASTRODUODENOSCOPY (EGD) WITH PROPOFOL;  Surgeon: Hilarie Fredrickson, MD;  Location: [facility]OS[facility];  Service: Endoscopy;  Laterality: N/A;  Balloon Dilation   ESOPHAGOGASTRODUODENOSCOPY (EGD) WITH PROPOFOL N/A 02/19/2021   Procedure: ESOPHAGOGASTRODUODENOSCOPY (EGD) WITH PROPOFOL;  Surgeon: Imogene Burn, MD;  Location: [facility]OS[facility];  Service: Gastroenterology;  Laterality: N/A;   ESOPHAGOGASTRODUODENOSCOPY (EGD) WITH PROPOFOL N/A 03/12/2021   Procedure: ESOPHAGOGASTRODUODENOSCOPY (EGD) WITH PROPOFOL;  Surgeon: Shellia Cleverly, DO;  Location: [facility] ENDOSCOPY;  Service: Gastroenterology;  Laterality: N/A;   ESOPHAGOGASTRODUODENOSCOPY (EGD) WITH PROPOFOL N/A 12/29/2021   Procedure: ESOPHAGOGASTRODUODENOSCOPY (EGD) WITH PROPOFOL;  Surgeon: Meryl Dare, MD;  Location: [facility] ENDOSCOPY;  Service: Gastroenterology;  Laterality: N/A;   FLEXIBLE SIGMOIDOSCOPY N/A 12/29/2021   Procedure: FLEXIBLE SIGMOIDOSCOPY;  Surgeon: Meryl Dare, MD;  Location: [facility] ENDOSCOPY;  Service: Gastroenterology;  Laterality: N/A;   FOREIGN BODY REMOVAL N/A 02/03/2021   Procedure: FOREIGN BODY  REMOVAL;  Surgeon: Iva Boop, MD;  Location: [facility] ENDOSCOPY;  Service: Endoscopy;  Laterality: N/A;   INSERTION OF DIALYSIS CATHETER N/A 03/20/2018   Procedure: INSERTION OF TUNNELED DIALYSIS CATHETER - RIGHT INTERANL JUGULAR PLACEMENT;  Surgeon: Chuck Hint, MD;  Location: [facility] OR;  Service: Vascular;  Laterality: N/A;   IR FLUORO GUIDE CV LINE RIGHT  04/18/2020   IR GUIDED DRAIN W CATHETER PLACEMENT  10/28/2018   KIDNEY TRANSPLANT  2007   KIDNEY TRANSPLANT Right    PARATHYROIDECTOMY  ?2012   "3/4 removed" (10/24/2017)   RENAL BIOPSY Bilateral 2003   REVISON OF ARTERIOVENOUS FISTULA Left 09/24/2020   Procedure: LEFT UPPER ARM ARTERIOVENOUS GRAFT CREATION;  Surgeon: Cephus Shelling,  MD;  Location: [facility] OR;  Service: Vascular;  Laterality: Left;   UPPER EXTREMITY VENOGRAPHY Bilateral 10/19/2018   Procedure: UPPER EXTREMITY VENOGRAPHY;  Surgeon: Cephus Shelling, MD;  Location: [facility];  Service: Cardiovascular;  Laterality: Bilateral;  Bilateral    UPPER EXTREMITY VENOGRAPHY Left 09/04/2020   Procedure: UPPER EXTREMITY VENOGRAPHY - Left Upper;  Surgeon: Cephus Shelling, MD;  Location: [facility];  Service: Cardiovascular;  Laterality: Left;    Family Hx:  Family History  Problem Relation Age of Onset   Arthritis Mother    Hypertension Mother    Aneurysm Mother        died of brain aneurysm   CAD Father        Has 3 stents   Diabetes Father        borderline   Early death Brother        Died in war   Colon cancer Neg Hx    Esophageal cancer Neg Hx    Rectal cancer Neg Hx    Stomach cancer Neg Hx     Social History:  reports that she has never smoked. She has never been exposed to tobacco smoke. She has never used smokeless tobacco. She reports that she does not currently use alcohol. She reports that she does not use drugs.  Allergies:  Allergies  Allergen Reactions   Diphenhydramine Anaphylaxis   Peanut-Containing Drug Products Itching and  Hives    Able to tolerate when cooked in foods   Contrast Media [Iodinated Contrast Media] Itching   Shellfish Allergy Hives   Chlorhexidine Itching   Ferrous Sulfate Itching   Iron Dextran Itching and Other (See Comments)    Vein irritation     Medications: Prior to Admission medications   Medication Sig Start Date End Date Taking? Authorizing Provider  amoxicillin (AMOXIL) 400 MG/5ML suspension Take by mouth. 05/20/23  Yes [provider]  calcitRIOL (ROCALTROL) 0.5 MCG capsule Take 0.5 mcg by mouth daily. 05/16/23  Yes [provider]  Calcium Carbonate Antacid (CALCIUM CARBONATE, DOSED IN MG ELEMENTAL CALCIUM,) 1250 MG/5ML SUSP Take 500 mg of elemental calcium by mouth 3 (three) times daily. 05/15/23  Yes [provider]  carvedilol (COREG) 12.5 MG tablet Take 12.5 mg by mouth 2 (two) times daily. 05/16/23  Yes [provider]  conjugated estrogens (PREMARIN) vaginal cream Place vaginally. 05/23/23 05/22/24 Yes [provider]  ELIQUIS 5 MG TABS tablet Take 5 mg by mouth 2 (two) times daily. 05/16/23  Yes [provider]  esomeprazole (NEXIUM) 40 MG capsule Take by mouth. 05/31/23  Yes [provider]  fluconazole (DIFLUCAN) 150 MG tablet Take 150 mg by mouth once. 05/30/23  Yes [provider]  hydrOXYzine (ATARAX) 25 MG tablet Take by mouth. 05/16/23  Yes [provider]  Insulin Aspart FlexPen (NOVOLOG) 100 UNIT/ML inject 4-10 units  under the skin with meals as directed.  Max 30 units per day 05/19/23  Yes [provider]  insulin degludec (TRESIBA) 100 UNIT/ML FlexTouch Pen Inject into the skin. 05/19/23 05/13/24 Yes [provider]  levofloxacin (LEVAQUIN) 750 MG tablet Take by mouth. 05/11/23 06/11/23 Yes [provider]  mycophenolate (CELLCEPT) 250 MG capsule Take by mouth. 05/19/23 05/18/24 Yes [provider]  NIFEdipine (PROCARDIA-XL/NIFEDICAL-XL) 30 MG 24 hr tablet Take by mouth.  05/19/23 05/18/24 Yes [provider]  nystatin (MYCOSTATIN) 100000 UNIT/ML suspension Take 5 mLs by mouth 3 (three) times daily. 05/19/23  Yes [provider]  omeprazole (PRILOSEC) 40 MG capsule Take 40 mg by mouth daily. 05/16/23  Yes [provider]  pravastatin (PRAVACHOL) 40 MG tablet Take 40 mg by mouth daily. 05/16/23  Yes [provider]  sevelamer carbonate (RENVELA) 800 MG tablet Take 800 mg by mouth 3 (three) times daily. 05/15/23  Yes [provider]  sucralfate (CARAFATE) 1 g tablet TAKE 1 TABLET BY MOUTH FOUR TIMES DAILY BEFORE MEALS AND NIGHTY. DISSOLVE IN TABLESPOON OF LIQUID OR APPLEASAUCE 05/31/23  Yes [provider]  sulfamethoxazole-trimethoprim (BACTRIM) 400-80 MG tablet Take 1 tablet by mouth 3 (three) times a week. 05/16/23  Yes [provider]  docusate sodium (COLACE) 100 MG capsule Take 1 capsule (100 mg total) by mouth 2 (two) times daily as needed for mild constipation. 05/08/23   Assaker, [place] Bali, MD  fidaxomicin (DIFICID) 200 MG TABS tablet Take 1 tablet (200 mg total) by mouth 2 (two) times daily. 06/06/23   Rondel Baton, MD  heparin 09811 UT/250ML infusion Inject 1,100 Units/hr into the vein continuous. 05/08/23   Assaker, [place], MD  hydrocortisone sodium succinate (SOLU-CORTEF) 100 MG injection Inject 2 mLs (100 mg total) into the vein every 8 (eight) hours. 05/08/23   Assaker, [place], MD  insulin regular, human (MYXREDLIN) 100 units/ 100 mL infusion Inject 10 Units/hr into the vein continuous. 05/08/23   Assaker, [place], MD  meropenem 1 g in sodium chloride 0.9 % 100 mL Inject 1 g into the vein 2 (two) times daily. 05/08/23   Assaker, [place] B[place], MD  Mouthwashes (MOUTH RINSE) LIQD solution 15 mLs by Mouth Rinse route as needed (for oral care). 05/08/23   Assaker, [place], MD  norepinephrine (LEVOPHED) 4-5 MG/250ML-% SOLN Inject 0-40 mcg/min into the vein continuous. 05/08/23   Assaker,  [place], MD  ondansetron [facility]) 4 MG/2ML SOLN injection Inject 2 mLs (4 mg total) into the vein every 6 (six) hours as needed for nausea or vomiting. 05/08/23   Assaker, [place] Bali, MD  ondansetron (ZOFRAN-ODT) 4 MG disintegrating tablet Take 1 tablet (4 mg total) by mouth every 8 (eight) hours as needed for nausea or vomiting. 06/06/23   Rondel Baton, MD  pantoprazole (PROTONIX) 40 MG injection Inject 40 mg into the vein daily. 05/08/23   Assaker, [place], MD  polyethylene glycol (MIRALAX / GLYCOLAX) 17 g packet Take 17 g by mouth daily as needed for moderate constipation. 05/08/23   Janann Colonel, MD    I have reviewed the patient's current medications.  Labs:     Latest Ref Rng & Units 06/08/2023    4:09 AM 06/07/2023    4:08 PM 06/06/2023    5:48 PM  BMP  Glucose 70 - 99 mg/dL 914  782  956   BUN 6 - 20 mg/dL 21  22  27    Creatinine 0.44 - 1.00 mg/dL 2.13  0.86  5.78   Sodium 135 - 145 mmol/L 137  134  134   Potassium 3.5 - 5.1 mmol/L 4.2  4.5  5.0   Chloride 98 - 111 mmol/L 105  104  102   CO2 22 - 32 mmol/L 23  20  24    Calcium 8.9 - 10.3 mg/dL 8.2  8.6  8.9     Urinalysis    Component Value Date/Time   COLORURINE YELLOW 06/07/2023 1940   APPEARANCEUR CLEAR 06/07/2023 1940   LABSPEC 1.017 06/07/2023 1940   PHURINE 5.0 06/07/2023 1940   GLUCOSEU >=500 (A) 06/07/2023 1940   HGBUR NEGATIVE 06/07/2023 1940  HGBUR negative 01/26/2010 1026   BILIRUBINUR NEGATIVE 06/07/2023 1940   BILIRUBINUR NEG 01/01/2011 1050   KETONESUR NEGATIVE 06/07/2023 1940   PROTEINUR NEGATIVE 06/07/2023 1940   UROBILINOGEN 0.2 01/09/2015 1333   NITRITE NEGATIVE 06/07/2023 1940   LEUKOCYTESUR NEGATIVE 06/07/2023 1940     ROS:  Pertinent items are noted in HPI.  Physical Exam: Vitals:   06/08/23 0846 06/08/23 0904  BP: 123/80 (!) 165/100  Pulse: 91   Resp: 18 17  Temp: 98.3 F (36.8 C) 98.5 F (36.9 C)  SpO2: 94%      General: A&O, NAD, sitting up comfortably in  hospital bed HEENT: No sign of trauma, EOM grossly intact, dry mucous membranes Cardiac: RRR, no m/r/g Respiratory: CTAB, normal WOB, no w/c/r GI: Soft, mild epigastric tenderness, non-distended, no rebound or guarding, multiple well-healed abdominal surgical scars, no abdominal bruits over left renal transplant Extremities: NTTP, no peripheral edema. Neuro: Moves all four extremities appropriately. Psych: Appropriate mood and affect   Assessment/Plan: Left renal transplantation - Reassuringly baseline kidney function since transplant remains the same around 1.5.  UA is also reassuringly without signs of infections or new hematuria or proteinuria.  Given active C. difficile infection patient remains about appropriate adjustment of immunosuppressive medicines. Plan: Will reach out to [facility] kidney transplant team regarding adjustment of mycophenolate dosage, typically will reduce during acute infection, likely continue prednisone 5 mg, and tacrolimus 5 mg twice daily Continue Bactrim daily and Valcyte daily Continue LR 75 mL/h, may require increased rate pending GI losses Trend BMP daily Avoid nephrotoxic medications and contrast studies possible Strict I/O C. difficile infection/Diarrhea/Vomiting - Treatment per primary team.   Celine Mans, MD, PGY-2 [facility] 12:19 PM 06/08/2023    Seen and examined independently.  Agree with note and exam as documented above by physician extender and as noted here.  [name] is a 38 year old female with a history of deceased donor renal transplant in December 2024 at [facility].  Prior to that was ESRD on hemodialysis here in [place].  She also has a history of hypertension and diabetes.  Her course was recently complicated by acute pyelonephritis of her transplant for which she was hospitalized.  She was recently at [facility] and had left [facility] recently against medical advice per [facility] attending.  Spoke with nephrology at [facility], MD, who  recommended reduction of CellCept to 250 mg p.o. twice daily and tacrolimus level in the morning.   General adult female in bed in no acute distress HEENT normocephalic atraumatic extraocular movements intact sclera anicteric Neck supple trachea midline Lungs clear to auscultation bilaterally normal work of breathing at rest on room air Heart S1S2 no rub Abdomen soft mildly tender nondistended; no bruit  Extremities no edema; no bruit or thrill of prior LUE access Psych normal mood and affect  Deceased donor renal transplant  02/2023 at [facility]  - allograft function is stable   Immunosuppression  - reduce cellcept to 250 mg BID  - Continue tacrolimus 5 mg BID - Continue prednisone 5 mg daily  - ordered tacrolimus trough in AM - note she got two doses close together the evening of admission   C dif  - per primary team  - she is on LR - agree - per report was also on jardiance.  Given her diarrhea and predisposition for pre-renal AKI I am holding this   Intractable n/v - now tolerating meds with anti-emetics  We have recommended transfer to [facility] and [facility] agrees.  Per chart message patient is refusing transfer   Estanislado Emms, MD 06/08/2023  2:04 PM

## 2023-06-08 NOTE — Progress Notes (Signed)
 New Admission Note:    Arrival Method: wheelchair Mental Orientation: a/ox4 Telemetry: box 6 Assessment: completed Skin: intact IV: rt forearm Pain: 6/10 Tubes: n/a Safety Measures: nonksidsocks  Admission: completed Orientation: Patient has been oriented to the room, unit and staff.  Family: n/a Belongings: at bedside  Orders have been reviewed and implemented. Will continue to monitor the patient. Call light has been placed within reach and bed alarm has been activated.   Fabian Sharp BSN, RN-BC Phone number: [phone]

## 2023-06-08 NOTE — Progress Notes (Addendum)
 PROGRESS NOTE  [name] ZOX:[id number] DOB: 03/13/1986 DOA: 06/07/2023 PCP: Fleet Contras, MD  HPI/Recap of past 24 hours: [name] is a 38 y.o. female with medical history significant for left kidney transplant in December 2024 on immunosuppressants, followed by [facility], history of DVT on Eliquis, recent pyelonephritis involving transplant kidney, completed course of antibiotics who initially presented at [facility] ED with complaints of intractable nausea, vomiting, abdominal pain, and diarrhea. CT a/p on 06/06/2023 showing no acute localizing process in the abdomen or pelvis.  Stool test returned positive for both C. difficile and Norovirus. Due to the intractable nausea and vomiting she was unable to keep her immunosuppressants down for the past 2 days. In the ER, tachycardic and tachypneic.  She received IV fluid bolus 1 L NS, IV antiemetics, IV opiate based analgesics and her home transplant medications, immunosuppressants were restarted.  Admitted by [facility], hospitalist service, for intractable nausea and vomiting and treatment for C. difficile diarrhea.    Today, patient still complaining of significant abdominal/epigastric pain, nausea but denies any recent vomiting.  Still having diarrhea.  Due to recent renal transplant in December 2024, was advised to transfer patient to [facility] at her transplant center.  Currently, patient refusing transfer and request to be kept here at [facility].  Patient is aware of the consequences and has verbalized understanding.      Assessment/Plan: Principal Problem:   Intractable nausea and vomiting   Intractable nausea and vomiting in the setting of norovirus and C. difficile infection, POA Abdominal pain CT a/p on 06/06/2023 showing no acute localizing process in the abdomen or pelvis Continue IVF, IV antiemetics as needed Replete electrolytes as indicated Supportive care   C. difficile diarrhea Continue Dificid Continue IV  fluid hydration  Hypomagnesemia Replace as needed   History of left renal transplant CKD 3B Missed 2 days of her renal transplant medications due to nausea and vomiting Discussed with transplant team at [facility] [place], nephrologist Dr Sharman Cheek, cell no [phone] recommended to reduce Cellcept to 250 mg BID, continue Prograf, prednisone, valganciclovir, Bactrim. Transplant team also recommended transfer to [facility] [place], patient refusing Avoid nephrotoxic agents, dehydration and hypotension Monitor urine output Continue IV fluid hydration  Anemia of CKD Hemoglobin around baseline Daily CBC  Diabetes mellitus type 2 A1c pending SSI, Semglee, Accu-Cheks, hypoglycemic protocol   Recent pyelonephritis involving her transplant kidney Per the patient she recently completed her course of antibiotics She was on Levaquin and Augmentin   History of DVT Resume home Eliquis  Obesity Lifestyle modification advised    Estimated body mass index is 30.67 kg/m as calculated from the following:   Height as of this encounter: 5\' 6"  (1.676 m).   Weight as of this encounter: 86.2 kg.     Code Status: Full  Family Communication: None at bedside  Disposition Plan: Status is: Inpatient Remains inpatient appropriate because: Level of care      Consultants: Nephrology  Procedures: None  Antimicrobials: None  DVT prophylaxis: Eliquis   Objective: Vitals:   06/08/23 0528 06/08/23 0529 06/08/23 0846 06/08/23 0904  BP: 110/79 110/79 123/80 (!) 165/100  Pulse: 95 95 91   Resp:  18 18 17   Temp:  98.4 F (36.9 C) 98.3 F (36.8 C) 98.5 F (36.9 C)  TempSrc:    Oral  SpO2: 96% 96% 94%   Weight:      Height:        Intake/Output Summary (Last 24  hours) at 06/08/2023 1517 Last data filed at 06/08/2023 0400 Gross per 24 hour  Intake 343.82 ml  Output --  Net 343.82 ml   Filed Weights   06/07/23 1606  Weight: 86.2 kg    Exam: General: NAD  Cardiovascular: S1,  S2 present Respiratory: CTAB Abdomen: Soft, tender, nondistended, bowel sounds present Musculoskeletal: No bilateral pedal edema noted Skin: Normal Psychiatry: Fair mood     Data Reviewed: CBC: Recent Labs  Lab 06/06/23 1748 06/07/23 1608 06/08/23 0409  WBC 5.1 4.6 4.2  NEUTROABS 3.8  --   --   HGB 10.8* 10.6* 9.0*  HCT 33.9* 34.6* 28.1*  MCV 89.7 93.0 90.4  PLT 202 204 153   Basic Metabolic Panel: Recent Labs  Lab 06/06/23 1748 06/07/23 1608 06/08/23 0409  NA 134* 134* 137  K 5.0 4.5 4.2  CL 102 104 105  CO2 24 20* 23  GLUCOSE 316* 323* 209*  BUN 27* 22* 21*  CREATININE 1.54* 1.50* 1.48*  CALCIUM 8.9 8.6* 8.2*  MG  --   --  1.5*  PHOS  --   --  3.3   GFR: Estimated Creatinine Clearance: 56.5 mL/min (A) (by C-G formula based on SCr of 1.48 mg/dL (H)). Liver Function Tests: Recent Labs  Lab 06/06/23 1748 06/07/23 1608  AST 12* 16  ALT 12 14  ALKPHOS 78 67  BILITOT 0.5 0.8  PROT 8.9* 8.8*  ALBUMIN 4.5 4.0   Recent Labs  Lab 06/06/23 1748 06/07/23 1608  LIPASE 39 45   No results for input(s): "AMMONIA" in the last 168 hours. Coagulation Profile: No results for input(s): "INR", "PROTIME" in the last 168 hours. Cardiac Enzymes: No results for input(s): "CKTOTAL", "CKMB", "CKMBINDEX", "TROPONINI" in the last 168 hours. BNP (last 3 results) No results for input(s): "PROBNP" in the last 8760 hours. HbA1C: No results for input(s): "HGBA1C" in the last 72 hours. CBG: Recent Labs  Lab 06/08/23 1138  GLUCAP 396*   Lipid Profile: No results for input(s): "CHOL", "HDL", "LDLCALC", "TRIG", "CHOLHDL", "LDLDIRECT" in the last 72 hours. Thyroid Function Tests: No results for input(s): "TSH", "T4TOTAL", "FREET4", "T3FREE", "THYROIDAB" in the last 72 hours. Anemia Panel: No results for input(s): "VITAMINB12", "FOLATE", "FERRITIN", "TIBC", "IRON", "RETICCTPCT" in the last 72 hours. Urine analysis:    Component Value Date/Time   COLORURINE YELLOW  06/07/2023 1940   APPEARANCEUR CLEAR 06/07/2023 1940   LABSPEC 1.017 06/07/2023 1940   PHURINE 5.0 06/07/2023 1940   GLUCOSEU >=500 (A) 06/07/2023 1940   HGBUR NEGATIVE 06/07/2023 1940   HGBUR negative 01/26/2010 1026   BILIRUBINUR NEGATIVE 06/07/2023 1940   BILIRUBINUR NEG 01/01/2011 1050   KETONESUR NEGATIVE 06/07/2023 1940   PROTEINUR NEGATIVE 06/07/2023 1940   UROBILINOGEN 0.2 01/09/2015 1333   NITRITE NEGATIVE 06/07/2023 1940   LEUKOCYTESUR NEGATIVE 06/07/2023 1940   Sepsis Labs: @LABRCNTIP (procalcitonin:4,lacticidven:4)  ) Recent Results (from the past 240 hours)  Gastrointestinal Panel by PCR , Stool     Status: Abnormal   Collection Time: 06/06/23  8:35 PM   Specimen: Urine, Clean Catch; Stool  Result Value Ref Range Status   Campylobacter species NOT DETECTED NOT DETECTED Final   Plesimonas shigelloides NOT DETECTED NOT DETECTED Final   Salmonella species NOT DETECTED NOT DETECTED Final   Yersinia enterocolitica NOT DETECTED NOT DETECTED Final   Vibrio species NOT DETECTED NOT DETECTED Final   Vibrio cholerae NOT DETECTED NOT DETECTED Final   Enteroaggregative E coli (EAEC) NOT DETECTED NOT DETECTED Final   Enteropathogenic E  coli (EPEC) NOT DETECTED NOT DETECTED Final   Enterotoxigenic E coli (ETEC) NOT DETECTED NOT DETECTED Final   Shiga like toxin producing E coli (STEC) NOT DETECTED NOT DETECTED Final   Shigella/Enteroinvasive E coli (EIEC) NOT DETECTED NOT DETECTED Final   Cryptosporidium NOT DETECTED NOT DETECTED Final   Cyclospora cayetanensis NOT DETECTED NOT DETECTED Final   Entamoeba histolytica NOT DETECTED NOT DETECTED Final   Giardia lamblia NOT DETECTED NOT DETECTED Final   Adenovirus F40/41 NOT DETECTED NOT DETECTED Final   Astrovirus NOT DETECTED NOT DETECTED Final   Norovirus GI/GII DETECTED (A) NOT DETECTED Final    Comment: RESULT CALLED TO, READ BACK BY AND VERIFIED WITH: COURTNEY MAS 06/07/23 0918 MW    Rotavirus A NOT DETECTED NOT DETECTED  Final   Sapovirus (I, II, IV, and V) NOT DETECTED NOT DETECTED Final    Comment: Performed at [facility], [address] [place], [place] [address]  C Difficile Quick Screen w PCR reflex     Status: Abnormal   Collection Time: 06/06/23  8:35 PM   Specimen: Urine, Clean Catch; Stool  Result Value Ref Range Status   C Diff antigen POSITIVE (A) NEGATIVE Final   C Diff toxin NEGATIVE NEGATIVE Final   C Diff interpretation Results are indeterminate. See PCR results.  Final    Comment: Performed at [facility], [address], [place], [place] [address]  C. Diff by PCR, Reflexed     Status: Abnormal   Collection Time: 06/06/23  8:35 PM  Result Value Ref Range Status   Toxigenic C. Difficile by PCR POSITIVE (A) NEGATIVE Final    Comment: Positive for toxigenic C. difficile with little to no toxin production. Only treat if clinical presentation suggests symptomatic illness. Performed at [facility], [address], [place], [place] [address]   Urine Culture     Status: Abnormal (Preliminary result)   Collection Time: 06/06/23  8:48 PM   Specimen: Urine, Clean Catch  Result Value Ref Range Status   Specimen Description   Final    URINE, CLEAN CATCH Performed at [facility], [address], [place], [place] [address]    Special Requests   Final    NONE Performed at [facility], [address], [place], [place] [address]    Culture (A)  Final    60,000 COLONIES/mL ESCHERICHIA COLI 40,000 COLONIES/mL STAPHYLOCOCCUS EPIDERMIDIS SUSCEPTIBILITIES TO FOLLOW Performed at [facility], [address] [place], [place] [address]    Report Status PENDING  Incomplete  Blood culture (routine x 2)     Status: None (Preliminary result)   Collection Time: 06/07/23  5:46 PM   Specimen: BLOOD RIGHT FOREARM  Result Value Ref Range Status   Specimen Description BLOOD RIGHT FOREARM  Final   Special Requests   Final    BOTTLES  DRAWN AEROBIC AND ANAEROBIC Blood Culture adequate volume   Culture   Final    NO GROWTH < 24 HOURS Performed at [facility], [address] [place], [place] [address]    Report Status PENDING  Incomplete  Blood culture (routine x 2)     Status: None (Preliminary result)   Collection Time: 06/07/23  5:57 PM   Specimen: BLOOD LEFT HAND  Result Value Ref Range Status   Specimen Description BLOOD LEFT HAND  Final   Special Requests   Final    BOTTLES DRAWN AEROBIC AND ANAEROBIC Blood Culture results may  not be optimal due to an inadequate volume of blood received in culture bottles   Culture   Final    NO GROWTH < 24 HOURS Performed at [facility], [address], [place], [place] [address]    Report Status PENDING  Incomplete      Studies: No results found.  Scheduled Meds:  apixaban  5 mg Oral BID   fidaxomicin  200 mg Oral BID   insulin aspart  0-5 Units Subcutaneous QHS   insulin aspart  0-6 Units Subcutaneous TID WC   insulin glargine-yfgn  8 Units Subcutaneous Q24H   mycophenolate  250 mg Oral BID   predniSONE  5 mg Oral Q breakfast   sulfamethoxazole-trimethoprim  1 tablet Oral Once per day on Monday Wednesday Friday   tacrolimus  5 mg Oral BID   valGANciclovir  450 mg Oral Daily    Continuous Infusions:  lactated ringers 75 mL/hr at 06/07/23 2323   promethazine (PHENERGAN) injection (IM or IVPB) 12.5 mg (06/08/23 0447)     LOS: 1 day     Briant Cedar, MD [facility]  If 7PM-7AM, please contact night-coverage [email or web address] 06/08/2023, 3:17 PM

## 2023-06-08 NOTE — Plan of Care (Signed)

## 2023-06-08 NOTE — Plan of Care (Signed)
  Problem: Education: Goal: Individualized Educational Video(s) Outcome: Not Applicable   

## 2023-06-08 NOTE — Inpatient Diabetes Management (Addendum)
 Inpatient Diabetes Program Recommendations  AACE/ADA: New Consensus Statement on Inpatient Glycemic Control (2015)  Target Ranges:  Prepandial:   less than 140 mg/dL      Peak postprandial:   less than 180 mg/dL (1-2 hours)      Critically ill patients:  140 - 180 mg/dL   Lab Results  Component Value Date   GLUCAP 273 (H) 05/08/2023   HGBA1C 9.9 (H) 12/21/2022    Review of Glycemic Control  Latest Reference Range & Units 12/21/22 15:59 12/21/22 20:10 12/21/22 23:51 12/22/22 02:03 12/22/22 04:07 12/22/22 08:16 05/08/23 09:00  Glucose-Capillary 70 - 99 mg/dL 161 (H) 096 (H) 045 (H) 141 (H) 129 (H) 148 (H) 273 (H)  (H): Data is abnormally high Diabetes history: Type 1 DM  Outpatient Diabetes medications: Novolog 4-12 units TID, Tresiba 12 units QD Current orders for Inpatient glycemic control: none Prednisone 5 mg QD  Inpatient Diabetes Program Recommendations:    Repeat A1C?   Additionally, consider: -Adding Novolog 0-6 units TID -Lantus 8 units every day  Thanks, Lujean Rave, MSN, RNC-OB Diabetes Coordinator [phone] (8a-5p)

## 2023-06-09 DIAGNOSIS — R112 Nausea with vomiting, unspecified: Secondary | ICD-10-CM | POA: Diagnosis not present

## 2023-06-09 LAB — URINE CULTURE: Culture: 60000 — AB

## 2023-06-09 LAB — GLUCOSE, CAPILLARY
Glucose-Capillary: 439 mg/dL — ABNORMAL HIGH (ref 70–99)
Glucose-Capillary: 223 mg/dL — ABNORMAL HIGH (ref 70–99)
Glucose-Capillary: 113 mg/dL — ABNORMAL HIGH (ref 70–99)
Glucose-Capillary: 304 mg/dL — ABNORMAL HIGH (ref 70–99)

## 2023-06-09 LAB — RENAL FUNCTION PANEL
Albumin: 3.2 g/dL — ABNORMAL LOW (ref 3.5–5.0)
Anion gap: 5 (ref 5–15)
BUN: 23 mg/dL — ABNORMAL HIGH (ref 6–20)
CO2: 23 mmol/L (ref 22–32)
Calcium: 7.8 mg/dL — ABNORMAL LOW (ref 8.9–10.3)
Chloride: 103 mmol/L (ref 98–111)
Creatinine, Ser: 1.59 mg/dL — ABNORMAL HIGH (ref 0.44–1.00)
GFR, Estimated: 42 mL/min — ABNORMAL LOW (ref >60)
Glucose, Bld: 457 mg/dL — ABNORMAL HIGH (ref 70–99)
Phosphorus: 3.2 mg/dL (ref 2.5–4.6)
Potassium: 5.7 mmol/L — ABNORMAL HIGH (ref 3.5–5.1)
Sodium: 131 mmol/L — ABNORMAL LOW (ref 135–145)

## 2023-06-09 LAB — CBC
HCT: 26.3 % — ABNORMAL LOW (ref 36.0–46.0)
Hemoglobin: 8.4 g/dL — ABNORMAL LOW (ref 12.0–15.0)
MCH: 28.8 pg (ref 26.0–34.0)
MCHC: 31.9 g/dL (ref 30.0–36.0)
MCV: 90.1 fL (ref 80.0–100.0)
Platelets: 147 10*3/uL — ABNORMAL LOW (ref 150–400)
RBC: 2.92 MIL/uL — ABNORMAL LOW (ref 3.87–5.11)
RDW: 15 % (ref 11.5–15.5)
WBC: 3.4 10*3/uL — ABNORMAL LOW (ref 4.0–10.5)
nRBC: 0 % (ref 0.0–0.2)

## 2023-06-09 LAB — BASIC METABOLIC PANEL WITH GFR
Anion gap: 8 (ref 5–15)
BUN: 25 mg/dL — ABNORMAL HIGH (ref 6–20)
CO2: 24 mmol/L (ref 22–32)
Calcium: 8.5 mg/dL — ABNORMAL LOW (ref 8.9–10.3)
Chloride: 104 mmol/L (ref 98–111)
Creatinine, Ser: 1.59 mg/dL — ABNORMAL HIGH (ref 0.44–1.00)
GFR, Estimated: 42 mL/min — ABNORMAL LOW (ref >60)
Glucose, Bld: 106 mg/dL — ABNORMAL HIGH (ref 70–99)
Potassium: 4.9 mmol/L (ref 3.5–5.1)
Sodium: 136 mmol/L (ref 135–145)

## 2023-06-09 LAB — HEMOGLOBIN A1C
Hgb A1c MFr Bld: 12.1 % — ABNORMAL HIGH (ref 4.8–5.6)
Mean Plasma Glucose: 300.57 mg/dL

## 2023-06-09 LAB — TACROLIMUS LEVEL: Tacrolimus (FK506) - LabCorp: 3.4 ng/mL — ABNORMAL LOW (ref 5.0–20.0)

## 2023-06-09 LAB — CULTURE, BLOOD (ROUTINE X 2): Special Requests: ADEQUATE

## 2023-06-09 MED ORDER — GABAPENTIN 300 MG PO CAPS
300.0000 mg | ORAL_CAPSULE | Freq: Every day | ORAL | Status: DC
  Administered 2023-06-09: 300 mg via ORAL
  Filled 2023-06-09: qty 1

## 2023-06-09 MED ORDER — HYDROMORPHONE HCL 1 MG/ML IJ SOLN
0.5000 mg | INTRAMUSCULAR | Status: DC | PRN
  Administered 2023-06-09: 0.5 mg via INTRAVENOUS
  Filled 2023-06-09: qty 0.5

## 2023-06-09 MED ORDER — NIFEDIPINE ER OSMOTIC RELEASE 30 MG PO TB24
30.0000 mg | ORAL_TABLET | Freq: Every day | ORAL | Status: DC
  Administered 2023-06-09 – 2023-06-10 (×2): 30 mg via ORAL
  Filled 2023-06-09 (×2): qty 1

## 2023-06-09 MED ORDER — SODIUM ZIRCONIUM CYCLOSILICATE 10 G PO PACK
10.0000 g | PACK | Freq: Once | ORAL | Status: AC
  Administered 2023-06-09: 10 g via ORAL
  Filled 2023-06-09: qty 1

## 2023-06-09 MED ORDER — CARVEDILOL 12.5 MG PO TABS
12.5000 mg | ORAL_TABLET | Freq: Two times a day (BID) | ORAL | Status: DC
  Administered 2023-06-09 – 2023-06-10 (×3): 12.5 mg via ORAL
  Filled 2023-06-09 (×3): qty 1

## 2023-06-09 MED ORDER — INSULIN GLARGINE-YFGN 100 UNIT/ML ~~LOC~~ SOLN
15.0000 [IU] | SUBCUTANEOUS | Status: DC
  Administered 2023-06-09: 15 [IU] via SUBCUTANEOUS
  Filled 2023-06-09: qty 0.15

## 2023-06-09 MED ORDER — HYDROXYZINE HCL 25 MG PO TABS
25.0000 mg | ORAL_TABLET | Freq: Every day | ORAL | Status: DC | PRN
  Administered 2023-06-09: 25 mg via ORAL
  Filled 2023-06-09: qty 1

## 2023-06-09 MED ORDER — INSULIN ASPART 100 UNIT/ML IJ SOLN
0.0000 [IU] | Freq: Three times a day (TID) | INTRAMUSCULAR | Status: DC
  Administered 2023-06-09 – 2023-06-10 (×2): 3 [IU] via SUBCUTANEOUS
  Administered 2023-06-10: 5 [IU] via SUBCUTANEOUS

## 2023-06-09 MED ORDER — INSULIN GLARGINE-YFGN 100 UNIT/ML ~~LOC~~ SOLN: 10.0000 [IU] | SUBCUTANEOUS | Status: DC

## 2023-06-09 MED ORDER — INSULIN ASPART 100 UNIT/ML IJ SOLN
15.0000 [IU] | Freq: Once | INTRAMUSCULAR | Status: AC
  Administered 2023-06-09: 15 [IU] via SUBCUTANEOUS

## 2023-06-09 MED ORDER — HYDRALAZINE HCL 20 MG/ML IJ SOLN: 10.0000 mg | Freq: Three times a day (TID) | INTRAMUSCULAR | Status: DC | PRN

## 2023-06-09 MED ORDER — SODIUM CHLORIDE 0.9 % IV SOLN: INTRAVENOUS | Status: AC

## 2023-06-09 MED ORDER — INSULIN ASPART 100 UNIT/ML IJ SOLN: 3.0000 [IU] | Freq: Three times a day (TID) | INTRAMUSCULAR | Status: DC

## 2023-06-09 MED ORDER — SUCRALFATE 1 GM/10ML PO SUSP
1.0000 g | Freq: Three times a day (TID) | ORAL | Status: DC
  Administered 2023-06-09 – 2023-06-10 (×5): 1 g via ORAL
  Filled 2023-06-09 (×5): qty 10

## 2023-06-09 MED ORDER — INSULIN GLARGINE-YFGN 100 UNIT/ML ~~LOC~~ SOLN
25.0000 [IU] | SUBCUTANEOUS | Status: DC
  Administered 2023-06-10: 25 [IU] via SUBCUTANEOUS
  Filled 2023-06-09: qty 0.25

## 2023-06-09 NOTE — Progress Notes (Signed)
  Patient's blood pressure being trended up systolic is being trended up systolic up to upper 160s range and diastolic upper 100 range.  Per chart review initial presentation patient was hypotensive in the setting of diarrhea, intractable nausea and vomiting. Current tolerating oral diet well and diarrhea has been improved with IV fluid hydration.  Patient also has history of renal transplant and CKD stage IIIb nephrology has has been following and contacted with transplant team with [facility].  -Resuming home Coreg 12.5 mg twice daily.  If blood pressure is still remaining high can resume Procardia XL.  Tereasa Coop, MD [facility] 06/09/2023, 4:54 AM

## 2023-06-09 NOTE — Progress Notes (Signed)
 Patient is comfort about abdominal pain.  Refusing oral oxycodone. -Starting IV Dilaudid 0.5 mg every 4 hour as needed for 1 day.  Tereasa Coop, MD [facility] 06/09/2023, 10:04 PM

## 2023-06-09 NOTE — Progress Notes (Addendum)
 PROGRESS NOTE  [name]:[id number] DOB: 05/28/1985 DOA: 06/07/2023 PCP: Fleet Contras, MD  HPI/Recap of past 24 hours: [name] is a 38 y.o. female with medical history significant for left kidney transplant in December 2024 on immunosuppressants, followed by [facility], history of DVT on Eliquis, recent pyelonephritis involving transplant kidney, completed course of antibiotics who initially presented at [facility] ED with complaints of intractable nausea, vomiting, abdominal pain, and diarrhea. CT a/p on 06/06/2023 showing no acute localizing process in the abdomen or pelvis.  Stool test returned positive for both C. difficile and Norovirus. Due to the intractable nausea and vomiting she was unable to keep her immunosuppressants down for the past 2 days. In the ER, tachycardic and tachypneic.  She received IV fluid bolus 1 L NS, IV antiemetics, IV opiate based analgesics and her home transplant medications, immunosuppressants were restarted.  Admitted by [facility], hospitalist service, for intractable nausea and vomiting and treatment for C. difficile diarrhea.    Today, patient reports feeling better today, denies worsening abdominal pain, nausea/vomiting, fever/chills, chest pain/shortness of breath.  Diarrhea improving    Assessment/Plan: Principal Problem:   Intractable nausea and vomiting   Intractable nausea and vomiting in the setting of norovirus and C. difficile infection, POA Abdominal pain, history of esophagitis CT a/p on 06/06/2023 showing no acute localizing process in the abdomen or pelvis Continue IVF, IV antiemetics as needed Continue sucralfate, Maalox as needed, unable to use PPIs due to ongoing C. difficile infection Replete electrolytes as indicated Supportive care   C. difficile diarrhea Continue Dificid Continue IV fluid hydration  Hypomagnesemia Replace as needed  Mild hyperkalemia S/p 1 dose of Lokelma   History of left renal transplant CKD 3B Missed  2 days of her renal transplant medications due to nausea and vomiting Discussed with transplant team at [facility], nephrologist Dr Sharman Cheek, cell no [phone] recommended to reduce Cellcept to 250 mg BID, continue Prograf, prednisone, valganciclovir, Bactrim. Transplant team also recommended transfer to [facility], patient refusing Avoid nephrotoxic agents, dehydration and hypotension Monitor urine output Continue IV fluid hydration  Anemia of CKD Hemoglobin around baseline Daily CBC  Diabetes mellitus type 2, controlled with hyperglycemia A1c 12 SSI, Semglee, Accu-Cheks, hypoglycemic protocol  Hypertension Noted to be uncontrolled Continue Coreg, nifedipine, IV hydralazine as needed   Recent pyelonephritis involving her transplant kidney Per the patient she recently completed her course of antibiotics She was on Levaquin and Augmentin   History of DVT Resume home Eliquis  Obesity Lifestyle modification advised    Estimated body mass index is 30.67 kg/m as calculated from the following:   Height as of this encounter: 5\' 6"  (1.676 m).   Weight as of this encounter: 86.2 kg.     Code Status: Full  Family Communication: None at bedside  Disposition Plan: Status is: Inpatient Remains inpatient appropriate because: Level of care      Consultants: Nephrology  Procedures: None  Antimicrobials: None  DVT prophylaxis: Eliquis   Objective: Vitals:   06/09/23 0425 06/09/23 0800 06/09/23 1635 06/09/23 1636  BP: (!) 147/103 (!) 167/105 (!) 196/112 (!) 191/126  Pulse: 78 84 88 91  Resp: 20  18   Temp: 98.1 F (36.7 C) 98.1 F (36.7 C)    TempSrc: Oral Oral    SpO2: 99% 100% 98%   Weight:      Height:        Intake/Output Summary (Last 24 hours) at 06/09/2023 1705 Last data filed  at 06/09/2023 0600 Gross per 24 hour  Intake 1837.52 ml  Output 0 ml  Net 1837.52 ml   Filed Weights   06/07/23 1606  Weight: 86.2 kg    Exam: General:  NAD  Cardiovascular: S1, S2 present Respiratory: CTAB Abdomen: Soft, tender, nondistended, bowel sounds present Musculoskeletal: No bilateral pedal edema noted Skin: Normal Psychiatry: Normal mood     Data Reviewed: CBC: Recent Labs  Lab 06/06/23 1748 06/07/23 1608 06/08/23 0409 06/09/23 0352  WBC 5.1 4.6 4.2 3.4*  NEUTROABS 3.8  --   --   --   HGB 10.8* 10.6* 9.0* 8.4*  HCT 33.9* 34.6* 28.1* 26.3*  MCV 89.7 93.0 90.4 90.1  PLT 202 204 153 147*   Basic Metabolic Panel: Recent Labs  Lab 06/06/23 1748 06/07/23 1608 06/08/23 0409 06/09/23 0352 06/09/23 1516  NA 134* 134* 137 131* 136  K 5.0 4.5 4.2 5.7* 4.9  CL 102 104 105 103 104  CO2 24 20* 23 23 24   GLUCOSE 316* 323* 209* 457* 106*  BUN 27* 22* 21* 23* 25*  CREATININE 1.54* 1.50* 1.48* 1.59* 1.59*  CALCIUM 8.9 8.6* 8.2* 7.8* 8.5*  MG  --   --  1.5*  --   --   PHOS  --   --  3.3 3.2  --    GFR: Estimated Creatinine Clearance: 52.6 mL/min (A) (by C-G formula based on SCr of 1.59 mg/dL (H)). Liver Function Tests: Recent Labs  Lab 06/06/23 1748 06/07/23 1608 06/09/23 0352  AST 12* 16  --   ALT 12 14  --   ALKPHOS 78 67  --   BILITOT 0.5 0.8  --   PROT 8.9* 8.8*  --   ALBUMIN 4.5 4.0 3.2*   Recent Labs  Lab 06/06/23 1748 06/07/23 1608  LIPASE 39 45   No results for input(s): "AMMONIA" in the last 168 hours. Coagulation Profile: No results for input(s): "INR", "PROTIME" in the last 168 hours. Cardiac Enzymes: No results for input(s): "CKTOTAL", "CKMB", "CKMBINDEX", "TROPONINI" in the last 168 hours. BNP (last 3 results) No results for input(s): "PROBNP" in the last 8760 hours. HbA1C: Recent Labs    06/09/23 0352  HGBA1C 12.1*   CBG: Recent Labs  Lab 06/08/23 1617 06/08/23 2011 06/09/23 0826 06/09/23 1143 06/09/23 1636  GLUCAP 264* 235* 439* 223* 113*   Lipid Profile: No results for input(s): "CHOL", "HDL", "LDLCALC", "TRIG", "CHOLHDL", "LDLDIRECT" in the last 72 hours. Thyroid  Function Tests: No results for input(s): "TSH", "T4TOTAL", "FREET4", "T3FREE", "THYROIDAB" in the last 72 hours. Anemia Panel: No results for input(s): "VITAMINB12", "FOLATE", "FERRITIN", "TIBC", "IRON", "RETICCTPCT" in the last 72 hours. Urine analysis:    Component Value Date/Time   COLORURINE YELLOW 06/07/2023 1940   APPEARANCEUR CLEAR 06/07/2023 1940   LABSPEC 1.017 06/07/2023 1940   PHURINE 5.0 06/07/2023 1940   GLUCOSEU >=500 (A) 06/07/2023 1940   HGBUR NEGATIVE 06/07/2023 1940   HGBUR negative 01/26/2010 1026   BILIRUBINUR NEGATIVE 06/07/2023 1940   BILIRUBINUR NEG 01/01/2011 1050   KETONESUR NEGATIVE 06/07/2023 1940   PROTEINUR NEGATIVE 06/07/2023 1940   UROBILINOGEN 0.2 01/09/2015 1333   NITRITE NEGATIVE 06/07/2023 1940   LEUKOCYTESUR NEGATIVE 06/07/2023 1940   Sepsis Labs: @LABRCNTIP (procalcitonin:4,lacticidven:4)  ) Recent Results (from the past 240 hours)  Gastrointestinal Panel by PCR , Stool     Status: Abnormal   Collection Time: 06/06/23  8:35 PM   Specimen: Urine, Clean Catch; Stool  Result Value Ref Range Status   Campylobacter  species NOT DETECTED NOT DETECTED Final   Plesimonas shigelloides NOT DETECTED NOT DETECTED Final   Salmonella species NOT DETECTED NOT DETECTED Final   Yersinia enterocolitica NOT DETECTED NOT DETECTED Final   Vibrio species NOT DETECTED NOT DETECTED Final   Vibrio cholerae NOT DETECTED NOT DETECTED Final   Enteroaggregative E coli (EAEC) NOT DETECTED NOT DETECTED Final   Enteropathogenic E coli (EPEC) NOT DETECTED NOT DETECTED Final   Enterotoxigenic E coli (ETEC) NOT DETECTED NOT DETECTED Final   Shiga like toxin producing E coli (STEC) NOT DETECTED NOT DETECTED Final   Shigella/Enteroinvasive E coli (EIEC) NOT DETECTED NOT DETECTED Final   Cryptosporidium NOT DETECTED NOT DETECTED Final   Cyclospora cayetanensis NOT DETECTED NOT DETECTED Final   Entamoeba histolytica NOT DETECTED NOT DETECTED Final   Giardia lamblia NOT  DETECTED NOT DETECTED Final   Adenovirus F40/41 NOT DETECTED NOT DETECTED Final   Astrovirus NOT DETECTED NOT DETECTED Final   Norovirus GI/GII DETECTED (A) NOT DETECTED Final    Comment: RESULT CALLED TO, READ BACK BY AND VERIFIED WITH: COURTNEY MAS 06/07/23 0918 MW    Rotavirus A NOT DETECTED NOT DETECTED Final   Sapovirus (I, II, IV, and V) NOT DETECTED NOT DETECTED Final    Comment: Performed at [facility], [address] [place], [place] [address]  C Difficile Quick Screen w PCR reflex     Status: Abnormal   Collection Time: 06/06/23  8:35 PM   Specimen: Urine, Clean Catch; Stool  Result Value Ref Range Status   C Diff antigen POSITIVE (A) NEGATIVE Final   C Diff toxin NEGATIVE NEGATIVE Final   C Diff interpretation Results are indeterminate. See PCR results.  Final    Comment: Performed at [facility], [address] [place], [place] [address]  C. Diff by PCR, Reflexed     Status: Abnormal   Collection Time: 06/06/23  8:35 PM  Result Value Ref Range Status   Toxigenic C. Difficile by PCR POSITIVE (A) NEGATIVE Final    Comment: Positive for toxigenic C. difficile with little to no toxin production. Only treat if clinical presentation suggests symptomatic illness. Performed at [facility], [address] [place], [place] [address]   Urine Culture     Status: Abnormal   Collection Time: 06/06/23  8:48 PM   Specimen: Urine, Clean Catch  Result Value Ref Range Status   Specimen Description   Final    URINE, CLEAN CATCH Performed at [facility], [address], [place], [place] [address]    Special Requests   Final    NONE Performed at [facility], [address] [place], [place] [address]    Culture (A)  Final    60,000 COLONIES/mL ESCHERICHIA COLI 40,000 COLONIES/mL STAPHYLOCOCCUS EPIDERMIDIS    Report Status 06/09/2023 FINAL  Final   Organism ID, Bacteria ESCHERICHIA COLI (A)  Final   Organism ID,  Bacteria STAPHYLOCOCCUS EPIDERMIDIS (A)  Final      Susceptibility   Escherichia coli - MIC*    AMPICILLIN >=32 RESISTANT Resistant     CEFAZOLIN <=4 SENSITIVE Sensitive     CEFEPIME <=0.12 SENSITIVE Sensitive     CEFTRIAXONE <=0.25 SENSITIVE Sensitive     CIPROFLOXACIN >=4 RESISTANT Resistant     GENTAMICIN >=16 RESISTANT Resistant     IMIPENEM <=0.25 SENSITIVE Sensitive     NITROFURANTOIN <=16 SENSITIVE Sensitive     TRIMETH/SULFA >=320 RESISTANT Resistant     AMPICILLIN/SULBACTAM >=32 RESISTANT  Resistant     PIP/TAZO <=4 SENSITIVE Sensitive ug/mL    * 60,000 COLONIES/mL ESCHERICHIA COLI   Staphylococcus epidermidis - MIC*    CIPROFLOXACIN >=8 RESISTANT Resistant     GENTAMICIN >=16 RESISTANT Resistant     NITROFURANTOIN <=16 SENSITIVE Sensitive     OXACILLIN >=4 RESISTANT Resistant     TETRACYCLINE >=16 RESISTANT Resistant     VANCOMYCIN 2 SENSITIVE Sensitive     TRIMETH/SULFA 80 RESISTANT Resistant     RIFAMPIN <=0.5 SENSITIVE Sensitive     Inducible Clindamycin NEGATIVE Sensitive     * 40,000 COLONIES/mL STAPHYLOCOCCUS EPIDERMIDIS  Blood culture (routine x 2)     Status: Abnormal   Collection Time: 06/07/23  5:46 PM   Specimen: BLOOD RIGHT FOREARM  Result Value Ref Range Status   Specimen Description BLOOD RIGHT FOREARM  Final   Special Requests   Final    BOTTLES DRAWN AEROBIC AND ANAEROBIC Blood Culture adequate volume   Culture  Setup Time   Final    GRAM POSITIVE COCCI IN CLUSTERS AEROBIC BOTTLE ONLY CRITICAL RESULT CALLED TO, READ BACK BY AND VERIFIED WITH: PHARMD E. PAYTES [id number] @ 2000 FH    Culture (A)  Final    STAPHYLOCOCCUS EPIDERMIDIS THE SIGNIFICANCE OF ISOLATING THIS ORGANISM FROM A SINGLE SET OF BLOOD CULTURES WHEN MULTIPLE SETS ARE DRAWN IS UNCERTAIN. PLEASE NOTIFY THE MICROBIOLOGY DEPARTMENT WITHIN ONE WEEK IF SPECIATION AND SENSITIVITIES ARE REQUIRED. Performed at [facility], [address] [place], [place] [address]    Report Status  06/09/2023 FINAL  Final  Blood Culture ID Panel (Reflexed)     Status: Abnormal   Collection Time: 06/07/23  5:46 PM  Result Value Ref Range Status   Enterococcus faecalis NOT DETECTED NOT DETECTED Final   Enterococcus Faecium NOT DETECTED NOT DETECTED Final   Listeria monocytogenes NOT DETECTED NOT DETECTED Final   Staphylococcus species DETECTED (A) NOT DETECTED Final    Comment: CRITICAL RESULT CALLED TO, READ BACK BY AND VERIFIED WITH: PHARMD E. PAYTES [id number] @ 2000 FH    Staphylococcus aureus (BCID) NOT DETECTED NOT DETECTED Final   Staphylococcus epidermidis DETECTED (A) NOT DETECTED Final    Comment: Methicillin (oxacillin) resistant coagulase negative staphylococcus. Possible blood culture contaminant (unless isolated from more than one blood culture draw or clinical case suggests pathogenicity). No antibiotic treatment is indicated for blood  culture contaminants. CRITICAL RESULT CALLED TO, READ BACK BY AND VERIFIED WITH: PHARMD E. PAYTES [id number] @ 2000 FH    Staphylococcus lugdunensis NOT DETECTED NOT DETECTED Final   Streptococcus species NOT DETECTED NOT DETECTED Final   Streptococcus agalactiae NOT DETECTED NOT DETECTED Final   Streptococcus pneumoniae NOT DETECTED NOT DETECTED Final   Streptococcus pyogenes NOT DETECTED NOT DETECTED Final   A.calcoaceticus-baumannii NOT DETECTED NOT DETECTED Final   Bacteroides fragilis NOT DETECTED NOT DETECTED Final   Enterobacterales NOT DETECTED NOT DETECTED Final   Enterobacter cloacae complex NOT DETECTED NOT DETECTED Final   Escherichia coli NOT DETECTED NOT DETECTED Final   Klebsiella aerogenes NOT DETECTED NOT DETECTED Final   Klebsiella oxytoca NOT DETECTED NOT DETECTED Final   Klebsiella pneumoniae NOT DETECTED NOT DETECTED Final   Proteus species NOT DETECTED NOT DETECTED Final   Salmonella species NOT DETECTED NOT DETECTED Final   Serratia marcescens NOT DETECTED NOT DETECTED Final   Haemophilus influenzae NOT DETECTED NOT  DETECTED Final   Neisseria meningitidis NOT DETECTED NOT DETECTED Final   Pseudomonas aeruginosa NOT DETECTED NOT DETECTED Final  Stenotrophomonas maltophilia NOT DETECTED NOT DETECTED Final   Candida albicans NOT DETECTED NOT DETECTED Final   Candida auris NOT DETECTED NOT DETECTED Final   Candida glabrata NOT DETECTED NOT DETECTED Final   Candida krusei NOT DETECTED NOT DETECTED Final   Candida parapsilosis NOT DETECTED NOT DETECTED Final   Candida tropicalis NOT DETECTED NOT DETECTED Final   Cryptococcus neoformans/gattii NOT DETECTED NOT DETECTED Final   Methicillin resistance mecA/C DETECTED (A) NOT DETECTED Final    Comment: CRITICAL RESULT CALLED TO, READ BACK BY AND VERIFIED WITH: Armen Pickup @ 2000 FH Performed at [facility], [address] [place], [place] [address]   Blood culture (routine x 2)     Status: None (Preliminary result)   Collection Time: 06/07/23  5:57 PM   Specimen: BLOOD LEFT HAND  Result Value Ref Range Status   Specimen Description BLOOD LEFT HAND  Final   Special Requests   Final    BOTTLES DRAWN AEROBIC AND ANAEROBIC Blood Culture results may not be optimal due to an inadequate volume of blood received in culture bottles   Culture   Final    NO GROWTH 2 DAYS Performed at [facility], [address] [place], [place] [address]    Report Status PENDING  Incomplete      Studies: No results found.  Scheduled Meds:  apixaban  5 mg Oral BID   carvedilol  12.5 mg Oral BID WC   fidaxomicin  200 mg Oral BID   gabapentin  300 mg Oral QHS   insulin aspart  0-5 Units Subcutaneous QHS   insulin aspart  0-9 Units Subcutaneous TID WC   [START ON 06/10/2023] insulin glargine-yfgn  25 Units Subcutaneous Q24H   mycophenolate  250 mg Oral BID   predniSONE  5 mg Oral Q breakfast   sucralfate  1 g Oral TID WC & HS   sulfamethoxazole-trimethoprim  1 tablet Oral Once per day on Monday Wednesday Friday   tacrolimus  5 mg Oral BID    valGANciclovir  450 mg Oral Daily    Continuous Infusions:  sodium chloride 75 mL/hr at 06/09/23 1217   famotidine (PEPCID) IV 20 mg (06/09/23 1005)   promethazine (PHENERGAN) injection (IM or IVPB) 12.5 mg (06/08/23 0447)     LOS: 2 days     Briant Cedar, MD [facility]  If 7PM-7AM, please contact night-coverage [email or web address] 06/09/2023, 5:05 PM

## 2023-06-09 NOTE — Inpatient Diabetes Management (Signed)
 Inpatient Diabetes Program Recommendations  AACE/ADA: New Consensus Statement on Inpatient Glycemic Control (2015)  Target Ranges:  Prepandial:   less than 140 mg/dL      Peak postprandial:   less than 180 mg/dL (1-2 hours)      Critically ill patients:  140 - 180 mg/dL   Lab Results  Component Value Date   GLUCAP 439 (H) 06/09/2023   HGBA1C 12.1 (H) 06/09/2023    Review of Glycemic Control  Latest Reference Range & Units 06/08/23 11:38 06/08/23 16:17 06/08/23 20:11 06/09/23 08:26  Glucose-Capillary 70 - 99 mg/dL 782 (H) 956 (H) 213 (H) 439 (H)  (H): Data is abnormally high Diabetes history: Type 1 DM Outpatient Diabetes medications: Novolog 4-12 units TID, Tresiba 12 units QD Current orders for Inpatient glycemic control: Novolog 0-9 units TID & Hs, Semglee 15 units QD Prednisone 5 mg QD   Inpatient Diabetes Program Recommendations:     Consider further increasing Semglee 25 units every day and adding Novolog 3 units TID (assuming patient consuming >50% of meals)  Spoke with patient regarding outpatient diabetes management. Patient wears a Dexcom and is followed by [facility] (next appointment in April per patient). Denies missing doses. Has been on insulin pump in the past but did not understand how to operate. Reports that she adjusts her basal insulin based on glucose range.   Reviewed patient's current A1c of 12.1%. Explained what a A1c is and what it measures. Also reviewed goal A1c with patient, importance of good glucose control @ home, and blood sugar goals. Reviewed patho of DM, impact of glucose trends being post transplant, signs and symptoms of hyper vs hypo glycemia, interventions, sick day rules, impact of missed doses, importance of consistency, when to call endocrinology, impact of steroids post transplant, vascular changes and commorbidities.  Patient wears Dexcom. Encouraged to show trends to endocrinology at next visit and to discuss Q4H correction when  vomiting.  Patient requesting regular diet. States, "I should be covered for carbs and can eat what I want." Agreed with patient, however, current glucose >400 mg/dL thus, anticipate need for insulin changes prior to diet change.  No further questions at this time. Will need prescriptions sent to pharmacy at discharge.   Thanks, Lujean Rave, MSN, RNC-OB Diabetes Coordinator [phone] (8a-5p)

## 2023-06-09 NOTE — Progress Notes (Addendum)
 [facility] Progress Note  Name: [name] MRN: [id number] DOB: [birth year]  Chief Complaint:  Kidney transplant  Subjective:  NAEO. Feels much improved from yesterday. Eating normally again. No chest pain, SOB. No episodes of diarrhea this AM.  Patient refused transfer yesterday.     Intake/Output Summary (Last 24 hours) at 06/09/2023 0858 Last data filed at 06/09/2023 0600 Gross per 24 hour  Intake 3139.67 ml  Output 0 ml  Net 3139.67 ml    Vitals:  Vitals:   06/08/23 1619 06/08/23 2017 06/09/23 0425 06/09/23 0800  BP: (!) 127/91 (!) 166/101 (!) 147/103 (!) 167/105  Pulse: 89 91 78 84  Resp: 18 18 20    Temp: 98.4 F (36.9 C) 98.2 F (36.8 C) 98.1 F (36.7 C) 98.1 F (36.7 C)  TempSrc:  Oral Oral Oral  SpO2: 92% 100% 99% 100%  Weight:      Height:         Physical Exam:  General: NAD, well appearing Neuro: A&O Cardiovascular: RRR, no murmurs, no peripheral edema Respiratory: normal WOB on RA, CTAB, no wheezes, ronchi or rales Abdomen: soft, NTTP, no rebound or guarding Extremities: Moving all 4 extremities equally   Medications reviewed   Labs:     Latest Ref Rng & Units 06/09/2023    3:52 AM 06/08/2023    4:09 AM 06/07/2023    4:08 PM  BMP  Glucose 70 - 99 mg/dL 409  811  914   BUN 6 - 20 mg/dL 23  21  22    Creatinine 0.44 - 1.00 mg/dL 7.82  9.56  2.13   Sodium 135 - 145 mmol/L 131  137  134   Potassium 3.5 - 5.1 mmol/L 5.7  4.2  4.5   Chloride 98 - 111 mmol/L 103  105  104   CO2 22 - 32 mmol/L 23  23  20    Calcium 8.9 - 10.3 mg/dL 7.8  8.2  8.6      Assessment/Plan:  Left renal donor transplant/Immunosuppression - Creatinine function remains stable at 1.59.  Continued reduce dose cellcept to 250mg  BID Continue Tacrolimus 5mg  BID Continue Prednisone 5mg  daily Follow-up tacrolimus level C dif Per primary team Switch to 75 mL/h normal saline Hold Jardiance Hyperkalemia Lokelma 10 g x 1 ordered Recheck K+, BMP 3 PM Stop  LR Pending stable potassium, patient is stable for discharge from renal perspective  Celine Mans, MD, PGY-2 [facility] 9:01 AM 06/09/2023    Seen and examined independently.  Agree with note and exam as documented above by resident physician Dr. Velna Ochs and as noted here.  [name] refused transfer to [facility].  States feeling better and wonders when [name] is going home.     General adult female in bed in no acute distress HEENT normocephalic atraumatic extraocular movements intact sclera anicteric Neck supple trachea midline Lungs clear to auscultation bilaterally normal work of breathing at rest on room air Heart S1S2 no rub Abdomen soft nontender nondistended; no bruit  Extremities no edema; no bruit or thrill of prior LUE access Psych normal mood and affect Neuro alert and oriented x 3 provides hx and follows commands   Deceased donor renal transplant  02/2023 at [facility]  - allograft function is stable    # Hyperkalemia - lokelma this AM - repeat K  - changed to renal diabetic diet and discussed with her  - changed to normal saline this AM  Immunosuppression  - reduce cellcept to 250 mg BID  while on abx for c dif.  [name] states transplant team called her about this as well.  Then resume prior 750 mg BID dose for cellcept upon completion - Continue tacrolimus 5 mg BID - Continue prednisone 5 mg daily  - a random tacrolimus level was drawn rather than a trough so not sure will be very helpful   C dif  - per primary team  - changed to normal saline  - per report was also on jardiance.  Given her diarrhea and predisposition for pre-renal AKI I am holding this    Intractable n/v - now tolerating meds with anti-emetics  Disposition per primary team.  Will need to ensure [name] is tolerating PO including medications and ensure that her hyperkalemia improves  Estanislado Emms, MD 06/09/2023  3:17 PM

## 2023-06-09 NOTE — TOC CM/SW Note (Signed)
 Transition of Care [facility]) - Inpatient Brief Assessment   Patient Details  Name: [name] MRN: [id number] Date of Birth: 03/10/1986  Transition of Care [facility]) CM/SW Contact:    Tom-Johnson, Hershal Coria, RN Phone Number: 06/09/2023, 4:16 PM   Clinical Narrative:  Patient presented to the ED with Intractable Nausea and Vomiting, Diarrhea and Hypotension.  Patient has hx of Gastroparesis, Hernia repair, DVT on Eliquis, ESRD s/p Renal Transplant at [facility] on 02/2023.   Nephrology following, patient declined transfer to [facility].  From home alone, does not have children. Dad and five supporting siblings supportive.  Employed, independent with care and drive self. Does not have DME's at home.  PCP is Fleet Contras, MD and uses [facility] on [address]   No TOC needs or recommendations noted at this time.  Patient not Medically ready for discharge.  CM will continue to follow as patient progresses with care towards discharge.               Transition of Care Asessment: Insurance and Status: Insurance coverage has been reviewed Patient has primary care physician: Yes Home environment has been reviewed: Yes Prior level of function:: Independent Prior/Current Home Services: No current home services Social Drivers of Health Review: SDOH reviewed no interventions necessary Readmission risk has been reviewed: Yes Transition of care needs: no transition of care needs at this time

## 2023-06-09 NOTE — Plan of Care (Signed)
  Problem: Fluid Volume: Goal: Compliance with measures to maintain balanced fluid volume will improve Outcome: Progressing   Problem: Metabolic: Goal: Ability to maintain appropriate glucose levels will improve Outcome: Progressing   Problem: Nutritional: Goal: Maintenance of adequate nutrition will improve Outcome: Progressing   Problem: Skin Integrity: Goal: Risk for impaired skin integrity will decrease Outcome: Progressing   Problem: Tissue Perfusion: Goal: Adequacy of tissue perfusion will improve Outcome: Progressing

## 2023-06-10 DIAGNOSIS — R112 Nausea with vomiting, unspecified: Secondary | ICD-10-CM | POA: Diagnosis not present

## 2023-06-10 LAB — CBC WITH DIFFERENTIAL/PLATELET
Abs Immature Granulocytes: 0.01 10*3/uL (ref 0.00–0.07)
Basophils Absolute: 0.1 10*3/uL (ref 0.0–0.1)
Basophils Relative: 2 %
Eosinophils Absolute: 0.1 10*3/uL (ref 0.0–0.5)
Eosinophils Relative: 4 %
HCT: 25.5 % — ABNORMAL LOW (ref 36.0–46.0)
Hemoglobin: 8.2 g/dL — ABNORMAL LOW (ref 12.0–15.0)
Immature Granulocytes: 0 %
Lymphocytes Relative: 15 %
Lymphs Abs: 0.4 10*3/uL — ABNORMAL LOW (ref 0.7–4.0)
MCH: 28.8 pg (ref 26.0–34.0)
MCHC: 32.2 g/dL (ref 30.0–36.0)
MCV: 89.5 fL (ref 80.0–100.0)
Monocytes Absolute: 0.3 10*3/uL (ref 0.1–1.0)
Monocytes Relative: 12 %
Neutro Abs: 1.9 10*3/uL (ref 1.7–7.7)
Neutrophils Relative %: 67 %
Platelets: 147 10*3/uL — ABNORMAL LOW (ref 150–400)
RBC: 2.85 MIL/uL — ABNORMAL LOW (ref 3.87–5.11)
RDW: 15.1 % (ref 11.5–15.5)
WBC: 2.9 10*3/uL — ABNORMAL LOW (ref 4.0–10.5)
nRBC: 0 % (ref 0.0–0.2)

## 2023-06-10 LAB — BASIC METABOLIC PANEL WITH GFR
Anion gap: 9 (ref 5–15)
BUN: 23 mg/dL — ABNORMAL HIGH (ref 6–20)
CO2: 24 mmol/L (ref 22–32)
Calcium: 7.8 mg/dL — ABNORMAL LOW (ref 8.9–10.3)
Chloride: 103 mmol/L (ref 98–111)
Creatinine, Ser: 1.41 mg/dL — ABNORMAL HIGH (ref 0.44–1.00)
GFR, Estimated: 49 mL/min — ABNORMAL LOW (ref >60)
Glucose, Bld: 208 mg/dL — ABNORMAL HIGH (ref 70–99)
Potassium: 4.2 mmol/L (ref 3.5–5.1)
Sodium: 136 mmol/L (ref 135–145)

## 2023-06-10 LAB — GLUCOSE, CAPILLARY
Glucose-Capillary: 210 mg/dL — ABNORMAL HIGH (ref 70–99)
Glucose-Capillary: 272 mg/dL — ABNORMAL HIGH (ref 70–99)

## 2023-06-10 MED ORDER — FAMOTIDINE 20 MG PO TABS: 20.0000 mg | ORAL_TABLET | Freq: Two times a day (BID) | ORAL | 0 refills | Status: DC

## 2023-06-10 MED ORDER — FIDAXOMICIN 200 MG PO TABS: 200.0000 mg | ORAL_TABLET | Freq: Two times a day (BID) | ORAL | 0 refills | Status: AC

## 2023-06-10 MED ORDER — MYCOPHENOLATE MOFETIL 250 MG PO CAPS: 250.0000 mg | ORAL_CAPSULE | Freq: Two times a day (BID) | ORAL | Status: DC

## 2023-06-10 MED ORDER — VALGANCICLOVIR HCL 450 MG PO TABS: 450.0000 mg | ORAL_TABLET | Freq: Every day | ORAL | 0 refills | Status: DC

## 2023-06-10 MED ORDER — FAMOTIDINE 20 MG PO TABS: 20.0000 mg | ORAL_TABLET | Freq: Two times a day (BID) | ORAL | Status: DC

## 2023-06-10 MED ORDER — OXYCODONE HCL 5 MG PO TABS: 5.0000 mg | ORAL_TABLET | Freq: Four times a day (QID) | ORAL | 0 refills | Status: DC | PRN

## 2023-06-10 MED ORDER — TACROLIMUS 5 MG PO CAPS: 5.0000 mg | ORAL_CAPSULE | Freq: Two times a day (BID) | ORAL | 0 refills | Status: AC

## 2023-06-10 NOTE — Plan of Care (Signed)
  Problem: Nutritional: Goal: Progress toward achieving an optimal weight will improve Outcome: Progressing   Problem: Metabolic: Goal: Ability to maintain appropriate glucose levels will improve Outcome: Progressing   Problem: Health Behavior/Discharge Planning: Goal: Ability to identify and utilize available resources and services will improve Outcome: Progressing   Problem: Fluid Volume: Goal: Ability to maintain a balanced intake and output will improve Outcome: Progressing

## 2023-06-10 NOTE — Discharge Summary (Signed)
 Physician Discharge Summary  Patient ID: [name] MRN: [id number] DOB/AGE: [birth year] 38 y.o.  Admit date: 06/07/2023 Discharge date: 06/10/2023  Admission Diagnoses:  Discharge Diagnoses:  Principal Problem:   Intractable nausea and vomiting   Discharged Condition: stable  Hospital Course: Patient is a 38 year old female with multiple medical problems.  Patient has history of solitary kidney, hypertension, diabetes mellitus, first renal transplant in 2007 and repeat left renal transplant in December 2024.  Other medical history includes GERD, intra-abdominal abscess, learning disability, prolonged QT interval, pseudoseizure and pyelonephritis.  Patient was admitted with intractable nausea and vomiting, abdominal pain and diarrhea.  CT scan of the abdomen and pelvis revealed no acute findings in the abdomen or pelvis.  Stool analysis revealed positive C. difficile and norovirus.  Patient was managed supportively, as well as, with Dificid.  Mycophenolate was decreased from 500 Mg p.o. twice daily to 250 Mg p.o. twice daily.  Patient is eager to be discharged back home.  Patient will complete 10-day course of Dificid on discharge.  Patient will follow-up with primary care provider, nephrology team and transplant nephrology team.  Case was discussed with patient's transplant nephrology team, and the transplant nephrology team will advise patient on when to increase back mycophenolate to 500 Mg p.o. twice daily.  Symptoms have resolved significantly.  Intractable nausea and vomiting in the setting of norovirus and C. difficile infection, POA Abdominal pain, history of esophagitis -CT scan of abdomen and pelvis did not reveal any significant findings. -Patient was managed supportively. -C. difficile was managed with Dificid.  Patient will complete 10-day course of Dificid.   -Mycophenolate was decreased from 500 Mg p.o. twice daily to 250 Mg p.o. twice daily during the hospital stay.  Patient  understands therapy need to increase back to the dose of mycophenolate after infection resolves.  Patient's kidney transplant team was contacted during the hospital stay.   -Renal transplant team based out of [facility] will advise patient on when to increase mycophenolate back from 250 Mg p.o. twice daily to 500 Mg twice daily.   -PPI was discontinued due to the C. difficile infection.   -Electrolytes were monitored and replaced.     C. difficile diarrhea Complete 10-day course of Dificid on discharge.     Hypomagnesemia Monitor and replace.     Mild hyperkalemia S/p 1 dose of Lokelma   History of left renal transplant CKD 3B -Missed 2 days of her renal transplant medications due to nausea and vomiting -Case was discussed with renal transplant team at [facility], Dr Sharman Cheek, nephrologist, cell no [phone], who recommended decreasing Cellcept to 250 mg BID, and continuing Prograf, prednisone, valganciclovir, Bactrim. Transplant team also recommended transfer to [facility], but patient refused.   -Avoided nephrotoxic agents, dehydration and hypotension   Anemia of CKD -Continue to monitor hemoglobin. -Local nephrology team was consulted during the hospital stay.     Diabetes mellitus type 2, controlled with hyperglycemia Hemoglobin A1c of 12%.   Need for better control of diabetes mellitus was discussed with patient extensively.   Hypertension Noted to be uncontrolled Continue Coreg, nifedipine Continue to optimize   Recent pyelonephritis involving her transplant kidney Per the patient, she recently completed course of antibiotics She was on Levaquin and Augmentin   History of DVT Resume home Eliquis   Obesity Lifestyle modification advised   Consults: Nephrology  Significant Diagnostic Studies:  Stool was positive for C. difficile and norovirus.    CT ABDOMEN AND PELVIS WITHOUT  CONTRAST:   TECHNIQUE: Multidetector CT imaging of the abdomen and pelvis was  performed following the standard protocol without IV contrast.   RADIATION DOSE REDUCTION: This exam was performed according to the departmental dose-optimization program which includes automated exposure control, adjustment of the mA and/or kV according to patient size and/or use of iterative reconstruction technique.   COMPARISON:  CT abdomen and pelvis 05/08/2023   FINDINGS: Lower chest: No acute abnormality.   Hepatobiliary: No focal liver abnormality is seen. Status post cholecystectomy. No biliary dilatation.   Pancreas: Unremarkable. No pancreatic ductal dilatation or surrounding inflammatory changes.   Spleen: Normal in size without focal abnormality.   Adrenals/Urinary Tract: Solitary native left kidney demonstrates atrophy, unchanged. Bilateral adrenal glands are within normal limits. There is a transplanted left lower quadrant kidney there is prominence of the left renal collecting system without obstructing calculus similar to prior. No significant perinephric stranding or perinephric fluid collection. The bladder is within normal limits.   Stomach/Bowel: Stomach is within normal limits. Appendix appears normal. No evidence of bowel wall thickening, distention, or inflammatory changes.   Vascular/Lymphatic: No significant vascular findings are present. No enlarged abdominal or pelvic lymph nodes.   Reproductive: Status post hysterectomy. No adnexal masses.   Other: There is scarring in the lower anterior abdominal wall. Low-density collection in the subcutaneous tissues of the left anterior abdominal wall measures 3.2 x 3.9 cm and appears unchanged. There is no abdominal wall hernia or ascites.   Musculoskeletal: No fracture is seen.   IMPRESSION: 1. No acute localizing process in the abdomen or pelvis. 2. Transplanted left lower quadrant kidney with prominence of the left renal collecting system without obstructing calculus, similar to prior. 3. Stable  low-density collection in the subcutaneous tissues of the left anterior abdominal wall.     Electronically Signed   By: Darliss Cheney M.D.   On: 06/06/2023 20:34    Discharge Exam: Blood pressure (!) 141/89, pulse 79, temperature 97.8 F (36.6 C), temperature source Oral, resp. rate 18, height 5\' 6"  (1.676 m), weight 86.2 kg, SpO2 100%.   Disposition: Discharge disposition: 01-Home or Self Care       Discharge Instructions     Diet - low sodium heart healthy   Complete by: As directed    Increase activity slowly   Complete by: As directed       Allergies as of 06/10/2023       Reactions   Diphenhydramine Anaphylaxis   Peanut-containing Drug Products Itching, Hives   Able to tolerate when cooked in foods   Contrast Media [iodinated Contrast Media] Itching   Shellfish Allergy Hives   Chlorhexidine Itching   Ferrous Sulfate Itching   Iron Dextran Itching, Other (See Comments)   Vein irritation        Medication List     STOP taking these medications    amoxicillin 400 MG/5ML suspension Commonly known as: AMOXIL   docusate sodium 100 MG capsule Commonly known as: COLACE   esomeprazole 40 MG capsule Commonly known as: NEXIUM   fluconazole 150 MG tablet Commonly known as: DIFLUCAN   heparin 16109 UT/250ML infusion   levofloxacin 750 MG tablet Commonly known as: LEVAQUIN   meropenem 1 g in sodium chloride 0.9 % 100 mL   norepinephrine 4-5 MG/250ML-% Soln Commonly known as: LEVOPHED   nystatin 100000 UNIT/ML suspension Commonly known as: MYCOSTATIN   omeprazole 40 MG capsule Commonly known as: PRILOSEC   ondansetron 4 MG disintegrating tablet Commonly known as:  ZOFRAN-ODT   ondansetron 4 MG/2ML Soln injection Commonly known as: ZOFRAN   pantoprazole 40 MG injection Commonly known as: PROTONIX   polyethylene glycol 17 g packet Commonly known as: MIRALAX / GLYCOLAX   sevelamer carbonate 800 MG tablet Commonly known as: RENVELA        TAKE these medications    calcitRIOL 0.5 MCG capsule Commonly known as: ROCALTROL Take 0.5 mcg by mouth daily.   calcium carbonate (dosed in mg elemental calcium) 1250 MG/5ML Susp Take 500 mg of elemental calcium by mouth 3 (three) times daily.   carvedilol 12.5 MG tablet Commonly known as: COREG Take 12.5 mg by mouth 2 (two) times daily.   conjugated estrogens vaginal cream Commonly known as: PREMARIN Place 1 applicator vaginally daily as needed (for dryness).   Eliquis 5 MG Tabs tablet Generic drug: apixaban Take 5 mg by mouth 2 (two) times daily.   famotidine 20 MG tablet Commonly known as: PEPCID Take 1 tablet (20 mg total) by mouth 2 (two) times daily.   fidaxomicin 200 MG Tabs tablet Commonly known as: DIFICID Take 1 tablet (200 mg total) by mouth 2 (two) times daily for 8 days.   gabapentin 300 MG capsule Commonly known as: NEURONTIN Take 300 mg by mouth at bedtime.   hydrOXYzine 25 MG tablet Commonly known as: ATARAX Take 25 mg by mouth daily as needed for itching.   Insulin Aspart FlexPen 100 UNIT/ML Commonly known as: NOVOLOG inject 4-10 units  under the skin with meals as directed.  Max 30 units per day   insulin degludec 100 UNIT/ML FlexTouch Pen Commonly known as: TRESIBA Inject 30 Units into the skin daily.   mycophenolate 250 MG capsule Commonly known as: CELLCEPT Take 1 capsule (250 mg total) by mouth 2 (two) times daily. What changed: when to take this   NIFEdipine 30 MG 24 hr tablet Commonly known as: PROCARDIA-XL/NIFEDICAL-XL Take 30 mg by mouth daily.   oxyCODONE 5 MG immediate release tablet Commonly known as: Oxy IR/ROXICODONE Take 1 tablet (5 mg total) by mouth every 6 (six) hours as needed for moderate pain (pain score 4-6) or breakthrough pain.   pravastatin 40 MG tablet Commonly known as: PRAVACHOL Take 40 mg by mouth daily.   predniSONE 5 MG tablet Commonly known as: DELTASONE Take 5 mg by mouth daily with breakfast.    sucralfate 1 g tablet Commonly known as: CARAFATE TAKE 1 TABLET BY MOUTH FOUR TIMES DAILY BEFORE MEALS AND NIGHTY. DISSOLVE IN TABLESPOON OF LIQUID OR APPLEASAUCE   sulfamethoxazole-trimethoprim 400-80 MG tablet Commonly known as: BACTRIM Take 1 tablet by mouth 3 (three) times a week. Monday, Wednesday and Fridays   tacrolimus 5 MG capsule Commonly known as: PROGRAF Take 1 capsule (5 mg total) by mouth 2 (two) times daily. What changed:  medication strength how much to take   valGANciclovir 450 MG tablet Commonly known as: VALCYTE Take 1 tablet (450 mg total) by mouth daily. Start taking on: June 11, 2023        Time spent: 35 minutes  Signed: Barnetta Chapel 06/10/2023, 3:58 PM

## 2023-06-10 NOTE — Plan of Care (Signed)
   Problem: Skin Integrity: Goal: Risk for impaired skin integrity will decrease Outcome: Completed/Met

## 2023-06-10 NOTE — TOC Transition Note (Signed)
 Transition of Care [facility]) - Discharge Note   Patient Details  Name: CHALET KERWIN MRN: [id number] Date of Birth: Apr 23, 1985  Transition of Care [facility] CM/SW Contact:  Tom-Johnson, Hershal Coria, RN Phone Number: 06/10/2023, 4:05 PM   Clinical Narrative:     Patient is scheduled for discharge today.  Readmission Risk Assessment done. Hospital f/u and discharge instructions on AVS. No TOC needs or recommendations noted. Family to transport at discharge.  No further TOC needs noted.       Final next level of care: Home/Self Care Barriers to Discharge: Barriers Resolved   Patient Goals and CMS Choice Patient states their goals for this hospitalization and ongoing recovery are:: To return home CMS Medicare.gov Compare Post Acute Care list provided to:: Patient Choice offered to / list presented to : NA      Discharge Placement                Patient to be transferred to facility by: Family      Discharge Plan and Services Additional resources added to the After Visit Summary for                  DME Arranged: N/A DME Agency: NA       HH Arranged: NA HH Agency: NA        Social Drivers of Health (SDOH) Interventions SDOH Screenings   Food Insecurity: Food Insecurity Present (06/08/2023)  Housing: Patient Declined (06/08/2023)  Transportation Needs: No Transportation Needs (06/08/2023)  Recent Concern: Transportation Needs - Unmet Transportation Needs (05/23/2023)   Received from [facility]  Utilities: At Risk (06/08/2023)  Depression (PHQ2-9): Low Risk  (04/19/2018)  Financial Resource Strain: High Risk (05/11/2023)   Received from [facility]  Physical Activity: Sufficiently Active (10/24/2017)  Social Connections: Moderately Isolated (06/08/2023)  Stress: No Stress Concern Present (10/24/2017)  Tobacco Use: Low Risk  (05/23/2023)   Received from [facility]     Readmission Risk Interventions    06/09/2023    4:15 PM  Readmission Risk  Prevention Plan  Transportation Screening Complete  Medication Review (RN Care Manager) Referral to Pharmacy  PCP or Specialist appointment within 3-5 days of discharge Complete  HRI or Home Care Consult Complete  SW Recovery Care/Counseling Consult Complete  Palliative Care Screening Not Applicable  Skilled Nursing Facility Not Applicable

## 2023-06-10 NOTE — Progress Notes (Signed)
 [facility] NEPHROLOGY PROGRESS NOTE  Assessment/ Plan: Pt is a 38 y.o. yo female    # Deceased donor renal transplant  02/2023 at [facility]  - allograft function is stable    # Hyperkalemia -Potassium level improved with medical management.  Recommend low potassium diet.   #Immunosuppression  - reduced cellcept to 250 mg BID while on abx for c dif.  She states transplant team called her about this as well.  Then resume prior 750 mg BID dose for cellcept upon completion - Continue tacrolimus 5 mg BID - Continue prednisone 5 mg daily  -Diarrhea has improved therefore continue current transplant medication.  Recommend to check Prograf level outpatient.   #C dif  - per primary team  - changed to normal saline  - per report was also on jardiance.  Given her diarrhea and predisposition for pre-renal AKI, recommend to hold until outpatient follow-up.  #Intractable n/v - now tolerating meds with anti-emetics  Patient is eager to go home today.  Okay to discharge from renal perspective.  She understands to follow-up with her transplant team soon. Sign off, please call us back with question.  Subjective: Seen and examined at bedside.  Patient is asking when she can go home.  Denies diarrhea.  No nausea, vomiting, chest pain or shortness of breath. Objective Vital signs in last 24 hours: Vitals:   06/09/23 1636 06/09/23 2004 06/10/23 0517 06/10/23 0737  BP: (!) 191/126 (!) 189/114 131/79 117/77  Pulse: 91 88 74 82  Resp:  18 18   Temp:  97.8 F (36.6 C) 97.6 F (36.4 C)   TempSrc:  Oral Oral   SpO2:  99% 92% 92%  Weight:      Height:       Weight change:   Intake/Output Summary (Last 24 hours) at 06/10/2023 1155 Last data filed at 06/10/2023 0530 Gross per 24 hour  Intake 985.01 ml  Output 0 ml  Net 985.01 ml       Labs: RENAL PANEL Recent Labs  Lab 06/06/23 1748 06/07/23 1608 06/08/23 0409 06/09/23 0352 06/09/23 1516 06/10/23 0632  NA 134* 134* 137  131* 136 136  K 5.0 4.5 4.2 5.7* 4.9 4.2  CL 102 104 105 103 104 103  CO2 24 20* 23 23 24 24   GLUCOSE 316* 323* 209* 457* 106* 208*  BUN 27* 22* 21* 23* 25* 23*  CREATININE 1.54* 1.50* 1.48* 1.59* 1.59* 1.41*  CALCIUM 8.9 8.6* 8.2* 7.8* 8.5* 7.8*  MG  --   --  1.5*  --   --   --   PHOS  --   --  3.3 3.2  --   --   ALBUMIN 4.5 4.0  --  3.2*  --   --     Liver Function Tests: Recent Labs  Lab 06/06/23 1748 06/07/23 1608 06/09/23 0352  AST 12* 16  --   ALT 12 14  --   ALKPHOS 78 67  --   BILITOT 0.5 0.8  --   PROT 8.9* 8.8*  --   ALBUMIN 4.5 4.0 3.2*   Recent Labs  Lab 06/06/23 1748 06/07/23 1608  LIPASE 39 45   No results for input(s): "AMMONIA" in the last 168 hours. CBC: Recent Labs    06/06/23 1748 06/07/23 1608 06/08/23 0409 06/09/23 0352 06/10/23 0632  HGB 10.8* 10.6* 9.0* 8.4* 8.2*  MCV 89.7 93.0 90.4 90.1 89.5    Cardiac Enzymes: No results for input(s): "CKTOTAL", "CKMB", "CKMBINDEX", "TROPONINI"  in the last 168 hours. CBG: Recent Labs  Lab 06/09/23 0826 06/09/23 1143 06/09/23 1636 06/09/23 2036 06/10/23 0807  GLUCAP 439* 223* 113* 304* 210*    Iron Studies: No results for input(s): "IRON", "TIBC", "TRANSFERRIN", "FERRITIN" in the last 72 hours. Studies/Results: No results found.  Medications: Infusions:  famotidine (PEPCID) IV 20 mg (06/09/23 2200)   promethazine (PHENERGAN) injection (IM or IVPB) 12.5 mg (06/08/23 0447)    Scheduled Medications:  apixaban  5 mg Oral BID   carvedilol  12.5 mg Oral BID WC   fidaxomicin  200 mg Oral BID   gabapentin  300 mg Oral QHS   insulin aspart  0-5 Units Subcutaneous QHS   insulin aspart  0-9 Units Subcutaneous TID WC   insulin glargine-yfgn  25 Units Subcutaneous Q24H   mycophenolate  250 mg Oral BID   NIFEdipine  30 mg Oral Daily   predniSONE  5 mg Oral Q breakfast   sucralfate  1 g Oral TID WC & HS   sulfamethoxazole-trimethoprim  1 tablet Oral Once per day on Monday Wednesday Friday    tacrolimus  5 mg Oral BID   valGANciclovir  450 mg Oral Daily    have reviewed scheduled and prn medications.  Physical Exam: General:NAD, comfortable Heart:RRR, s1s2 nl Lungs:clear b/l, no crackle Abdomen:soft, Non-tender, non-distended Extremities:No edema Neurology: Alert, awake and following commands.  [name] [name] 06/10/2023,11:55 AM  LOS: 3 days

## 2023-06-11 ENCOUNTER — Observation Stay (HOSPITAL_COMMUNITY)

## 2023-06-11 ENCOUNTER — Encounter (HOSPITAL_COMMUNITY): Payer: Self-pay | Admitting: Emergency Medicine

## 2023-06-11 ENCOUNTER — Inpatient Hospital Stay (HOSPITAL_COMMUNITY)
Admission: EM | Admit: 2023-06-11 | Discharge: 2023-06-14 | DRG: 074 | Disposition: A | Discharge status: Home / Self Care | Attending: Internal Medicine | Admitting: Internal Medicine

## 2023-06-11 ENCOUNTER — Other Ambulatory Visit: Payer: Self-pay

## 2023-06-11 DIAGNOSIS — E785 Hyperlipidemia, unspecified: Secondary | ICD-10-CM | POA: Diagnosis present

## 2023-06-11 DIAGNOSIS — Z8261 Family history of arthritis: Secondary | ICD-10-CM

## 2023-06-11 DIAGNOSIS — M898X9 Other specified disorders of bone, unspecified site: Secondary | ICD-10-CM | POA: Diagnosis present

## 2023-06-11 DIAGNOSIS — N1832 Chronic kidney disease, stage 3b: Secondary | ICD-10-CM

## 2023-06-11 DIAGNOSIS — Z91010 Allergy to peanuts: Secondary | ICD-10-CM

## 2023-06-11 DIAGNOSIS — A0472 Enterocolitis due to Clostridium difficile, not specified as recurrent: Secondary | ICD-10-CM

## 2023-06-11 DIAGNOSIS — E1043 Type 1 diabetes mellitus with diabetic autonomic (poly)neuropathy: Principal | ICD-10-CM | POA: Diagnosis present

## 2023-06-11 DIAGNOSIS — Z79621 Long term (current) use of calcineurin inhibitor: Secondary | ICD-10-CM

## 2023-06-11 DIAGNOSIS — D638 Anemia in other chronic diseases classified elsewhere: Secondary | ICD-10-CM | POA: Diagnosis present

## 2023-06-11 DIAGNOSIS — Z940 Kidney transplant status: Secondary | ICD-10-CM

## 2023-06-11 DIAGNOSIS — R569 Unspecified convulsions: Secondary | ICD-10-CM

## 2023-06-11 DIAGNOSIS — Z91013 Allergy to seafood: Secondary | ICD-10-CM

## 2023-06-11 DIAGNOSIS — R000 Tachycardia, unspecified: Secondary | ICD-10-CM | POA: Diagnosis present

## 2023-06-11 DIAGNOSIS — Z5986 Financial insecurity: Secondary | ICD-10-CM

## 2023-06-11 DIAGNOSIS — Z8619 Personal history of other infectious and parasitic diseases: Secondary | ICD-10-CM

## 2023-06-11 DIAGNOSIS — E1065 Type 1 diabetes mellitus with hyperglycemia: Secondary | ICD-10-CM | POA: Diagnosis present

## 2023-06-11 DIAGNOSIS — Z794 Long term (current) use of insulin: Secondary | ICD-10-CM

## 2023-06-11 DIAGNOSIS — Z6829 Body mass index (BMI) 29.0-29.9, adult: Secondary | ICD-10-CM

## 2023-06-11 DIAGNOSIS — Z86718 Personal history of other venous thrombosis and embolism: Secondary | ICD-10-CM

## 2023-06-11 DIAGNOSIS — I10 Essential (primary) hypertension: Secondary | ICD-10-CM | POA: Diagnosis present

## 2023-06-11 DIAGNOSIS — Z79899 Other long term (current) drug therapy: Secondary | ICD-10-CM

## 2023-06-11 DIAGNOSIS — K222 Esophageal obstruction: Secondary | ICD-10-CM | POA: Diagnosis present

## 2023-06-11 DIAGNOSIS — Z91041 Radiographic dye allergy status: Secondary | ICD-10-CM

## 2023-06-11 DIAGNOSIS — K2100 Gastro-esophageal reflux disease with esophagitis, without bleeding: Secondary | ICD-10-CM | POA: Diagnosis not present

## 2023-06-11 DIAGNOSIS — Z5982 Transportation insecurity: Secondary | ICD-10-CM

## 2023-06-11 DIAGNOSIS — Z79624 Long term (current) use of inhibitors of nucleotide synthesis: Secondary | ICD-10-CM

## 2023-06-11 DIAGNOSIS — R112 Nausea with vomiting, unspecified: Principal | ICD-10-CM

## 2023-06-11 DIAGNOSIS — Z5941 Food insecurity: Secondary | ICD-10-CM

## 2023-06-11 DIAGNOSIS — R0682 Tachypnea, not elsewhere classified: Secondary | ICD-10-CM | POA: Diagnosis present

## 2023-06-11 DIAGNOSIS — R110 Nausea: Secondary | ICD-10-CM | POA: Diagnosis not present

## 2023-06-11 DIAGNOSIS — Z87892 Personal history of anaphylaxis: Secondary | ICD-10-CM

## 2023-06-11 DIAGNOSIS — D84821 Immunodeficiency due to drugs: Secondary | ICD-10-CM | POA: Diagnosis present

## 2023-06-11 DIAGNOSIS — Z833 Family history of diabetes mellitus: Secondary | ICD-10-CM

## 2023-06-11 DIAGNOSIS — E669 Obesity, unspecified: Secondary | ICD-10-CM | POA: Diagnosis present

## 2023-06-11 DIAGNOSIS — Z8249 Family history of ischemic heart disease and other diseases of the circulatory system: Secondary | ICD-10-CM

## 2023-06-11 DIAGNOSIS — Z888 Allergy status to other drugs, medicaments and biological substances status: Secondary | ICD-10-CM

## 2023-06-11 DIAGNOSIS — K3184 Gastroparesis: Secondary | ICD-10-CM

## 2023-06-11 DIAGNOSIS — Z882 Allergy status to sulfonamides status: Secondary | ICD-10-CM

## 2023-06-11 DIAGNOSIS — Z7901 Long term (current) use of anticoagulants: Secondary | ICD-10-CM

## 2023-06-11 DIAGNOSIS — R1010 Upper abdominal pain, unspecified: Secondary | ICD-10-CM | POA: Diagnosis present

## 2023-06-11 LAB — CBC WITH DIFFERENTIAL/PLATELET
Abs Immature Granulocytes: 0.02 10*3/uL (ref 0.00–0.07)
Basophils Absolute: 0.1 10*3/uL (ref 0.0–0.1)
Basophils Relative: 1 %
Eosinophils Absolute: 0.1 10*3/uL (ref 0.0–0.5)
Eosinophils Relative: 2 %
HCT: 33.4 % — ABNORMAL LOW (ref 36.0–46.0)
Hemoglobin: 10.6 g/dL — ABNORMAL LOW (ref 12.0–15.0)
Immature Granulocytes: 0 %
Lymphocytes Relative: 12 %
Lymphs Abs: 0.7 10*3/uL (ref 0.7–4.0)
MCH: 28.8 pg (ref 26.0–34.0)
MCHC: 31.7 g/dL (ref 30.0–36.0)
MCV: 90.8 fL (ref 80.0–100.0)
Monocytes Absolute: 0.4 10*3/uL (ref 0.1–1.0)
Monocytes Relative: 7 %
Neutro Abs: 4.5 10*3/uL (ref 1.7–7.7)
Neutrophils Relative %: 78 %
Platelets: 193 10*3/uL (ref 150–400)
RBC: 3.68 MIL/uL — ABNORMAL LOW (ref 3.87–5.11)
RDW: 15.1 % (ref 11.5–15.5)
WBC: 5.9 10*3/uL (ref 4.0–10.5)
nRBC: 0 % (ref 0.0–0.2)

## 2023-06-11 LAB — LIPASE, BLOOD: Lipase: 37 U/L (ref 11–51)

## 2023-06-11 LAB — COMPREHENSIVE METABOLIC PANEL WITH GFR
ALT: 21 U/L (ref 0–44)
AST: 29 U/L (ref 15–41)
Albumin: 4.2 g/dL (ref 3.5–5.0)
Alkaline Phosphatase: 58 U/L (ref 38–126)
Anion gap: 13 (ref 5–15)
BUN: 30 mg/dL — ABNORMAL HIGH (ref 6–20)
CO2: 21 mmol/L — ABNORMAL LOW (ref 22–32)
Calcium: 8.8 mg/dL — ABNORMAL LOW (ref 8.9–10.3)
Chloride: 102 mmol/L (ref 98–111)
Creatinine, Ser: 1.6 mg/dL — ABNORMAL HIGH (ref 0.44–1.00)
GFR, Estimated: 42 mL/min — ABNORMAL LOW (ref >60)
Glucose, Bld: 246 mg/dL — ABNORMAL HIGH (ref 70–99)
Potassium: 4.4 mmol/L (ref 3.5–5.1)
Sodium: 136 mmol/L (ref 135–145)
Total Bilirubin: 0.5 mg/dL (ref 0.0–1.2)
Total Protein: 8.9 g/dL — ABNORMAL HIGH (ref 6.5–8.1)

## 2023-06-11 LAB — URINALYSIS, ROUTINE W REFLEX MICROSCOPIC
Bacteria, UA: NONE SEEN
Bilirubin Urine: NEGATIVE
Glucose, UA: >=500 mg/dL — AB
Hgb urine dipstick: NEGATIVE
Ketones, ur: NEGATIVE mg/dL
Leukocytes,Ua: NEGATIVE
Nitrite: NEGATIVE
Protein, ur: NEGATIVE mg/dL
Specific Gravity, Urine: 1.014 (ref 1.005–1.030)
pH: 6 (ref 5.0–8.0)

## 2023-06-11 LAB — HCG, SERUM, QUALITATIVE: Preg, Serum: NEGATIVE

## 2023-06-11 LAB — MAGNESIUM: Magnesium: 1.7 mg/dL (ref 1.7–2.4)

## 2023-06-11 LAB — TACROLIMUS LEVEL: Tacrolimus (FK506) - LabCorp: 9.6 ng/mL (ref 5.0–20.0)

## 2023-06-11 MED ORDER — MORPHINE SULFATE (PF) 2 MG/ML IV SOLN
1.0000 mg | INTRAVENOUS | Status: DC | PRN
  Administered 2023-06-11 – 2023-06-12 (×2): 1 mg via INTRAVENOUS
  Filled 2023-06-11 (×2): qty 1

## 2023-06-11 MED ORDER — PRAVASTATIN SODIUM 40 MG PO TABS
40.0000 mg | ORAL_TABLET | Freq: Every day | ORAL | Status: DC
  Administered 2023-06-13 – 2023-06-14 (×2): 40 mg via ORAL
  Filled 2023-06-11 (×4): qty 1

## 2023-06-11 MED ORDER — MORPHINE SULFATE (PF) 4 MG/ML IV SOLN
4.0000 mg | Freq: Once | INTRAVENOUS | Status: AC
  Administered 2023-06-11: 4 mg via INTRAVENOUS
  Filled 2023-06-11: qty 1

## 2023-06-11 MED ORDER — FAMOTIDINE 20 MG PO TABS: 20.0000 mg | ORAL_TABLET | Freq: Two times a day (BID) | ORAL | Status: DC

## 2023-06-11 MED ORDER — APIXABAN 5 MG PO TABS
5.0000 mg | ORAL_TABLET | Freq: Two times a day (BID) | ORAL | Status: DC
  Administered 2023-06-11 – 2023-06-14 (×5): 5 mg via ORAL
  Filled 2023-06-11 (×7): qty 1

## 2023-06-11 MED ORDER — HYDROXYZINE HCL 25 MG PO TABS: 25.0000 mg | ORAL_TABLET | Freq: Every day | ORAL | Status: DC | PRN

## 2023-06-11 MED ORDER — ACETAMINOPHEN 650 MG RE SUPP: 650.0000 mg | Freq: Four times a day (QID) | RECTAL | Status: DC | PRN

## 2023-06-11 MED ORDER — VALGANCICLOVIR HCL 450 MG PO TABS
450.0000 mg | ORAL_TABLET | Freq: Every day | ORAL | Status: DC
  Administered 2023-06-13 – 2023-06-14 (×2): 450 mg via ORAL
  Filled 2023-06-11 (×4): qty 1

## 2023-06-11 MED ORDER — PROMETHAZINE HCL 25 MG/ML IJ SOLN
25.0000 mg | Freq: Four times a day (QID) | INTRAMUSCULAR | Status: DC | PRN
  Administered 2023-06-11 – 2023-06-12 (×2): 25 mg via INTRAVENOUS
  Filled 2023-06-11 (×2): qty 1
  Filled 2023-06-11: qty 25

## 2023-06-11 MED ORDER — GABAPENTIN 300 MG PO CAPS
300.0000 mg | ORAL_CAPSULE | Freq: Every day | ORAL | Status: DC
  Administered 2023-06-12 – 2023-06-13 (×2): 300 mg via ORAL
  Filled 2023-06-11: qty 1

## 2023-06-11 MED ORDER — FIDAXOMICIN 200 MG PO TABS
200.0000 mg | ORAL_TABLET | Freq: Two times a day (BID) | ORAL | Status: DC
  Administered 2023-06-11 – 2023-06-14 (×5): 200 mg via ORAL
  Filled 2023-06-11 (×8): qty 1

## 2023-06-11 MED ORDER — SODIUM CHLORIDE 0.9 % IV BOLUS
1000.0000 mL | Freq: Once | INTRAVENOUS | Status: AC
Start: 2023-06-11 — End: 2023-06-11
  Administered 2023-06-11: 1000 mL via INTRAVENOUS

## 2023-06-11 MED ORDER — CALCIUM CARBONATE ANTACID 1250 MG/5ML PO SUSP
500.0000 mg | Freq: Three times a day (TID) | ORAL | Status: DC
  Administered 2023-06-13 – 2023-06-14 (×4): 500 mg via ORAL
  Filled 2023-06-11 (×11): qty 5

## 2023-06-11 MED ORDER — HYDROMORPHONE HCL 1 MG/ML IJ SOLN
1.0000 mg | Freq: Once | INTRAMUSCULAR | Status: AC
  Administered 2023-06-11: 1 mg via INTRAVENOUS
  Filled 2023-06-11: qty 1

## 2023-06-11 MED ORDER — MYCOPHENOLATE MOFETIL 250 MG PO CAPS
250.0000 mg | ORAL_CAPSULE | Freq: Two times a day (BID) | ORAL | Status: DC
  Administered 2023-06-11 – 2023-06-14 (×5): 250 mg via ORAL
  Filled 2023-06-11 (×7): qty 1

## 2023-06-11 MED ORDER — ACETAMINOPHEN 325 MG PO TABS
650.0000 mg | ORAL_TABLET | Freq: Four times a day (QID) | ORAL | Status: DC | PRN
  Administered 2023-06-11: 650 mg via ORAL
  Filled 2023-06-11: qty 2

## 2023-06-11 MED ORDER — SUCRALFATE 1 G PO TABS
1.0000 g | ORAL_TABLET | Freq: Three times a day (TID) | ORAL | Status: DC
  Administered 2023-06-13 – 2023-06-14 (×4): 1 g via ORAL
  Filled 2023-06-11 (×6): qty 1

## 2023-06-11 MED ORDER — TACROLIMUS 1 MG PO CAPS
5.0000 mg | ORAL_CAPSULE | Freq: Two times a day (BID) | ORAL | Status: DC
  Administered 2023-06-11 – 2023-06-14 (×5): 5 mg via ORAL
  Filled 2023-06-11 (×9): qty 5

## 2023-06-11 MED ORDER — NIFEDIPINE ER OSMOTIC RELEASE 30 MG PO TB24
30.0000 mg | ORAL_TABLET | Freq: Every day | ORAL | Status: DC
  Administered 2023-06-13 – 2023-06-14 (×2): 30 mg via ORAL
  Filled 2023-06-11 (×4): qty 1

## 2023-06-11 MED ORDER — OXYCODONE HCL 5 MG PO TABS
5.0000 mg | ORAL_TABLET | Freq: Four times a day (QID) | ORAL | Status: DC | PRN
  Administered 2023-06-13 – 2023-06-14 (×2): 5 mg via ORAL
  Filled 2023-06-11 (×2): qty 1

## 2023-06-11 MED ORDER — CARVEDILOL 12.5 MG PO TABS
12.5000 mg | ORAL_TABLET | Freq: Two times a day (BID) | ORAL | Status: DC
  Administered 2023-06-11 – 2023-06-14 (×5): 12.5 mg via ORAL
  Filled 2023-06-11 (×7): qty 1

## 2023-06-11 MED ORDER — HYDROMORPHONE HCL 1 MG/ML IJ SOLN
1.0000 mg | INTRAMUSCULAR | Status: DC | PRN
  Administered 2023-06-11: 1 mg via INTRAVENOUS
  Filled 2023-06-11: qty 1

## 2023-06-11 MED ORDER — ORAL CARE MOUTH RINSE: 15.0000 mL | OROMUCOSAL | Status: DC | PRN

## 2023-06-11 MED ORDER — PREDNISONE 5 MG PO TABS
5.0000 mg | ORAL_TABLET | Freq: Every day | ORAL | Status: DC
Start: 2023-06-12 — End: 2023-06-14
  Administered 2023-06-13 – 2023-06-14 (×2): 5 mg via ORAL
  Filled 2023-06-11 (×3): qty 1

## 2023-06-11 MED ORDER — SODIUM CHLORIDE 0.45 % IV SOLN: INTRAVENOUS | Status: DC

## 2023-06-11 MED ORDER — ONDANSETRON HCL 4 MG/2ML IJ SOLN
4.0000 mg | Freq: Once | INTRAMUSCULAR | Status: AC
Start: 2023-06-11 — End: 2023-06-11
  Administered 2023-06-11: 4 mg via INTRAVENOUS
  Filled 2023-06-11: qty 2

## 2023-06-11 MED ORDER — PANTOPRAZOLE SODIUM 40 MG IV SOLR
40.0000 mg | Freq: Two times a day (BID) | INTRAVENOUS | Status: DC
  Administered 2023-06-11 – 2023-06-12 (×3): 40 mg via INTRAVENOUS
  Filled 2023-06-11 (×2): qty 10

## 2023-06-11 MED ORDER — CALCITRIOL 0.25 MCG PO CAPS
0.5000 ug | ORAL_CAPSULE | Freq: Every day | ORAL | Status: DC
  Administered 2023-06-13 – 2023-06-14 (×2): 0.5 ug via ORAL
  Filled 2023-06-11 (×4): qty 2

## 2023-06-11 MED ORDER — SODIUM CHLORIDE 0.9 % IV SOLN
INTRAVENOUS | Status: DC
Start: 2023-06-11 — End: 2023-06-11

## 2023-06-11 NOTE — Assessment & Plan Note (Signed)
 Continue blood pressure control with nifedipine and carvedilol.

## 2023-06-11 NOTE — ED Notes (Signed)
 Dr. Ella Jubilee in room, patients BP has been notified, per provider she is good to have this BP addressed upstairs.

## 2023-06-11 NOTE — H&P (Addendum)
 History and Physical    Patient: [name]:[id number] DOB: 02/12/1986 DOA: 06/11/2023 DOS: the patient was seen and examined on 06/11/2023 PCP: Fleet Contras, MD  Patient coming from: Home  Chief Complaint:  Chief Complaint  Patient presents with   Abdominal Pain   HPI: [name] is a 38 y.o. female with medical history significant of CKD sp renal transplant, T2DM, hyperlipidemia, hypertension and seizures who presented with abdominal pain, nausea and vomiting.   Patient was discharged from the hospital on 03/28 after being treated for C diff colitis. She was discharged on fidaxomicin for 8 more days.  At home patient had recurrent abdominal pain, severe in intensity, localized in the epigastric region, with radiation to her chest, with no worsening or improving factors.  It was associated with nausea and vomiting, she had one small liquid bowel movement at home, but no flatus since last night.  She has not been able to tolerate po intake or able to take her oral medications.   Noted that she has history of esophagitis and severe GERD, during her hospitalization she was given sucralfate and maalox as needed but not proton pump inhibitors due to C diff infection.  Multiple abdominal surgeries in the past.   Because worsening symptoms and not able to maintain po intake she came back to the hospital.     Review of Systems: As mentioned in the history of present illness. All other systems reviewed and are negative. Past Medical History:  Diagnosis Date   Anemia    Blood transfusion without reported diagnosis    Cellulitis of left leg 03/01/2018   Chronic kidney disease    kidney transplant 07   Diabetes mellitus    Pt reports diagnosis in June 2011, Type 2   Diabetes mellitus without complication (HCC)    Esophageal obstruction due to food impaction    GERD (gastroesophageal reflux disease)    Hyperlipidemia    Hypertension    Intra-abdominal abscess (HCC) 10/28/2018    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   LEARNING DISABILITY 09/25/2007   Qualifier: Diagnosis of  By: Dayton Martes MD, Talia     Nausea and vomiting    Prolonged Q-T interval on ECG    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   Pyelonephritis 06/23/2014   Renal and perinephric abscess 11/01/2018   Renal disorder    Seasonal allergies    Seizures (HCC)    UTI (urinary tract infection) 01/09/2015   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   Qualifier: Diagnosis of  By: Sandi Mealy  MD, Judeth Cornfield     Past Surgical History:  Procedure Laterality Date   A/V FISTULAGRAM Left 02/24/2023   Procedure: A/V Fistulagram;  Surgeon: Cephus Shelling, MD;  Location: [facility];  Service: Cardiovascular;  Laterality: Left;   AMPUTATION Left 04/02/2022   Procedure: AMPUTATION LEFT GREAT TOE;  Surgeon: Nadara Mustard, MD;  Location: [facility] OR;  Service: Orthopedics;  Laterality: Left;   ARTERIOVENOUS GRAFT PLACEMENT Bilateral    "neither work" (10/24/2017)   AV FISTULA PLACEMENT Left 10/26/2018   Procedure: CREATION OF ARTERIOVENOUS FISTULA  LEFT ARM;  Surgeon: Cephus Shelling, MD;  Location: [facility] OR;  Service: Vascular;  Laterality: Left;   AV FISTULA PLACEMENT Left 05/23/2020   Procedure: LEFT ARM ARTERIOVENOUS (AV) FISTULA CREATION;  Surgeon: Cephus Shelling, MD;  Location: [facility] OR;  Service: Vascular;  Laterality: Left;   BALLOON DILATION N/A 01/19/2021   Procedure: BALLOON DILATION;  Surgeon: Iva Boop, MD;  Location: [facility] ENDOSCOPY;  Service: Endoscopy;  Laterality: N/A;   BALLOON DILATION N/A 02/10/2021   Procedure: BALLOON DILATION;  Surgeon: Hilarie Fredrickson, MD;  Location: [facility] ENDOSCOPY;  Service: Endoscopy;  Laterality: N/A;   BALLOON DILATION N/A 02/19/2021   Procedure: BALLOON DILATION;  Surgeon: Imogene Burn, MD;  Location: [facility]COPY;  Service: Gastroenterology;  Laterality: N/A;   BASCILIC VEIN TRANSPOSITION Left 12/21/2018   Procedure: Left arm BASILIC VEIN TRANSPOSITION SECOND STAGE;  Surgeon: Cephus Shelling, MD;  Location: [facility] OR;  Service: Vascular;  Laterality: Left;   BIOPSY  12/29/2021   Procedure: BIOPSY;  Surgeon: Meryl Dare, MD;  Location: [facility] ENDOSCOPY;  Service: Gastroenterology;;   CHOLECYSTECTOMY N/A 06/30/2017   Procedure: LAPAROSCOPIC CHOLECYSTECTOMY WITH INTRAOPERATIVE CHOLANGIOGRAM;  Surgeon: Glenna Fellows, MD;  Location: [facility] ORS;  Service: General;  Laterality: N/A;   ESOPHAGOGASTRODUODENOSCOPY (EGD) WITH PROPOFOL N/A 07/04/2017   Procedure: ESOPHAGOGASTRODUODENOSCOPY (EGD) WITH PROPOFOL;  Surgeon: Vida Rigger, MD;  Location: [facility];  Service: Endoscopy;  Laterality: N/A;   ESOPHAGOGASTRODUODENOSCOPY (EGD) WITH PROPOFOL N/A 08/10/2020   Procedure: ESOPHAGOGASTRODUODENOSCOPY (EGD) WITH PROPOFOL;  Surgeon: Sherrilyn Rist, MD;  Location: [facility] ENDOSCOPY;  Service: Gastroenterology;  Laterality: N/A;   ESOPHAGOGASTRODUODENOSCOPY (EGD) WITH PROPOFOL N/A 01/19/2021   Procedure: ESOPHAGOGASTRODUODENOSCOPY (EGD) WITH PROPOFOL;  Surgeon: Iva Boop, MD;  Location: [facility];  Service: Endoscopy;  Laterality: N/A;  WITH FLUOROSCOPY AND DILATION   ESOPHAGOGASTRODUODENOSCOPY (EGD) WITH PROPOFOL N/A 02/03/2021   Procedure: ESOPHAGOGASTRODUODENOSCOPY (EGD) WITH PROPOFOL;  Surgeon: Iva Boop, MD;  Location: [facility];  Service: Endoscopy;  Laterality: N/A;   ESOPHAGOGASTRODUODENOSCOPY (EGD) WITH PROPOFOL N/A 02/10/2021   Procedure: ESOPHAGOGASTRODUODENOSCOPY (EGD) WITH PROPOFOL;  Surgeon: Hilarie Fredrickson, MD;  Location: [facility];  Service: Endoscopy;  Laterality: N/A;  Balloon Dilation   ESOPHAGOGASTRODUODENOSCOPY (EGD) WITH PROPOFOL N/A 02/19/2021   Procedure: ESOPHAGOGASTRODUODENOSCOPY (EGD) WITH PROPOFOL;  Surgeon: Imogene Burn, MD;  Location: [facility];  Service: Gastroenterology;  Laterality: N/A;   ESOPHAGOGASTRODUODENOSCOPY (EGD) WITH PROPOFOL N/A 03/12/2021   Procedure: ESOPHAGOGASTRODUODENOSCOPY (EGD) WITH PROPOFOL;  Surgeon: Shellia Cleverly, DO;  Location: [facility];  Service: Gastroenterology;  Laterality: N/A;   ESOPHAGOGASTRODUODENOSCOPY (EGD) WITH PROPOFOL N/A 12/29/2021   Procedure: ESOPHAGOGASTRODUODENOSCOPY (EGD) WITH PROPOFOL;  Surgeon: Meryl Dare, MD;  Location: [facility] ENDOSCOPY;  Service: Gastroenterology;  Laterality: N/A;   FLEXIBLE SIGMOIDOSCOPY N/A 12/29/2021   Procedure: FLEXIBLE SIGMOIDOSCOPY;  Surgeon: Meryl Dare, MD;  Location: [facility] ENDOSCOPY;  Service: Gastroenterology;  Laterality: N/A;   FOREIGN BODY REMOVAL N/A 02/03/2021   Procedure: FOREIGN BODY REMOVAL;  Surgeon: Iva Boop, MD;  Location: [facility] ENDOSCOPY;  Service: Endoscopy;  Laterality: N/A;   INSERTION OF DIALYSIS CATHETER N/A 03/20/2018   Procedure: INSERTION OF TUNNELED DIALYSIS CATHETER - RIGHT INTERANL JUGULAR PLACEMENT;  Surgeon: Chuck Hint, MD;  Location: [facility] OR;  Service: Vascular;  Laterality: N/A;   IR FLUORO GUIDE CV LINE RIGHT  04/18/2020   IR GUIDED DRAIN W CATHETER PLACEMENT  10/28/2018   KIDNEY TRANSPLANT  2007   KIDNEY TRANSPLANT Right    PARATHYROIDECTOMY  ?2012   "3/4 removed" (10/24/2017)   RENAL BIOPSY Bilateral 2003   REVISON OF ARTERIOVENOUS FISTULA Left 09/24/2020   Procedure: LEFT UPPER ARM ARTERIOVENOUS GRAFT CREATION;  Surgeon: Cephus Shelling, MD;  Location: [facility] OR;  Service: Vascular;  Laterality: Left;   UPPER EXTREMITY VENOGRAPHY Bilateral 10/19/2018   Procedure: UPPER EXTREMITY VENOGRAPHY;  Surgeon: Cephus Shelling, MD;  Location: [facility]  INVASIVE CV LAB;  Service: Cardiovascular;  Laterality: Bilateral;  Bilateral    UPPER EXTREMITY VENOGRAPHY Left 09/04/2020   Procedure: UPPER EXTREMITY VENOGRAPHY - Left Upper;  Surgeon: Cephus Shelling, MD;  Location: [facility];  Service: Cardiovascular;  Laterality: Left;   Social History:  reports that she has never smoked. She has never been exposed to tobacco smoke. She has never used smokeless tobacco. She reports that she does not currently use  alcohol. She reports that she does not use drugs.  Allergies  Allergen Reactions   Diphenhydramine Anaphylaxis   Peanut-Containing Drug Products Itching and Hives    Able to tolerate when cooked in foods   Contrast Media [Iodinated Contrast Media] Itching   Shellfish Allergy Hives   Chlorhexidine Itching   Ferrous Sulfate Itching   Iron Dextran Itching and Other (See Comments)    Vein irritation     Family History  Problem Relation Age of Onset   Arthritis Mother    Hypertension Mother    Aneurysm Mother        died of brain aneurysm   CAD Father        Has 3 stents   Diabetes Father        borderline   Early death Brother        Died in war   Colon cancer Neg Hx    Esophageal cancer Neg Hx    Rectal cancer Neg Hx    Stomach cancer Neg Hx     Prior to Admission medications   Medication Sig Start Date End Date Taking? Authorizing Provider  calcitRIOL (ROCALTROL) 0.5 MCG capsule Take 0.5 mcg by mouth daily. 05/16/23  Yes [provider]  Calcium Carbonate Antacid (CALCIUM CARBONATE, DOSED IN MG ELEMENTAL CALCIUM,) 1250 MG/5ML SUSP Take 500 mg of elemental calcium by mouth 3 (three) times daily. 05/15/23  Yes [provider]  carvedilol (COREG) 12.5 MG tablet Take 12.5 mg by mouth 2 (two) times daily. 05/16/23  Yes [provider]  conjugated estrogens (PREMARIN) vaginal cream Place 1 applicator vaginally daily as needed (for dryness). 05/23/23 05/22/24 Yes [provider]  ELIQUIS 5 MG TABS tablet Take 5 mg by mouth 2 (two) times daily. 05/16/23  Yes [provider]  famotidine (PEPCID) 20 MG tablet Take 1 tablet (20 mg total) by mouth 2 (two) times daily. 06/10/23  Yes Barnetta Chapel, MD  fidaxomicin (DIFICID) 200 MG TABS tablet Take 1 tablet (200 mg total) by mouth 2 (two) times daily for 8 days. 06/10/23 06/18/23 Yes Berton Mount I, MD  gabapentin (NEURONTIN) 300 MG capsule Take 300 mg by mouth at bedtime.   Yes [provider]  hydrOXYzine (ATARAX) 25 MG tablet Take 25 mg by mouth daily as needed for itching. 05/16/23  Yes [provider]  Insulin Aspart FlexPen (NOVOLOG) 100 UNIT/ML inject 4-10 units  under the skin with meals as directed.  Max 30 units per day 05/19/23  Yes [provider]  insulin degludec (TRESIBA) 100 UNIT/ML FlexTouch Pen Inject 30 Units into the skin daily. 05/19/23 05/13/24 Yes [provider]  mycophenolate (CELLCEPT) 250 MG capsule Take 1 capsule (250 mg total) by mouth 2 (two) times daily. 06/10/23  Yes Berton Mount I, MD  NIFEdipine (PROCARDIA-XL/NIFEDICAL-XL) 30 MG 24 hr tablet Take 30 mg by mouth daily. 05/19/23 05/18/24 Yes [provider]  oxyCODONE (OXY IR/ROXICODONE) 5 MG immediate release tablet Take 1 tablet (5 mg total) by mouth every 6 (  six) hours as needed for moderate pain (pain score 4-6) or breakthrough pain. 06/10/23  Yes Berton Mount I, MD  pravastatin (PRAVACHOL) 40 MG tablet Take 40 mg by mouth daily. 05/16/23  Yes [provider]  predniSONE (DELTASONE) 5 MG tablet Take 5 mg by mouth daily with breakfast.   Yes [provider]  sucralfate (CARAFATE) 1 g tablet TAKE 1 TABLET BY MOUTH FOUR TIMES DAILY BEFORE MEALS AND NIGHTY. DISSOLVE IN TABLESPOON OF LIQUID OR APPLEASAUCE 05/31/23  Yes [provider]  sulfamethoxazole-trimethoprim (BACTRIM) 400-80 MG tablet Take 1 tablet by mouth 3 (three) times a week. Monday, Wednesday and Fridays 05/16/23  Yes [provider]  tacrolimus (PROGRAF) 5 MG capsule Take 1 capsule (5 mg total) by mouth 2 (two) times daily. 06/10/23  Yes Barnetta Chapel, MD  valGANciclovir (VALCYTE) 450 MG tablet Take 1 tablet (450 mg total) by mouth daily. 06/11/23  Yes Barnetta Chapel, MD    Physical Exam: Vitals:   06/11/23 0702 06/11/23 0830 06/11/23 0845 06/11/23 1015  BP:  (!) 174/127 (!) 193/108 (!) 201/121  Pulse: (!) 108 (!) 104 95 95  Resp: (!) 22 (!) 23 20 (!) 24   Temp:      TempSrc:      SpO2: 100% 95% (!) 89% 100%   Neurology awake and alert ENT with mild pallor Cardiovascular with S1 and S2 present and regular with no gallops, rubs or murmurs, positive tachycardic  Respiratory with no rales or wheezing, no rhonchi Abdomen with mild distention, it is tender to superficial palpation, more at the epigastric region, did not tolerate percussion or deeper palpation due to pain, not able to assess rebound or frank guarding  No lower extremity edema  Data Reviewed:   Na 136, K 4,4 Cl 102 bicarbonate 21 glucose 246, bun 30 cr 1,60  Mg 1,7  Wbc 5,9 hgb 10,6 plt 193  Urine analysis SG 1,014, negative protein, negative hgb, negative leukocytes, glucose > 500   Abdominal radiograph with no obstructive pattern gas distribution.   EKG 105 bpm, right axis deviation, qtc 464, normal intervals, sinus rhythm with q wave lead II, III, aVF, V4 to V6, with no significant ST segment or T wave changes, (no acute changes).    Assessment and Plan: * GERD with esophagitis Patient with severe reflux symptoms. Her abdominal radiograph has no signs of bowel obstruction.   Plan to start patient on pantoprazole IV bid and continue sucralfate.  Pain control with as needed acetaminophen and IV hydromorphone.  Clear liquid diet for now.  Close follow up on response to treatment, if continue to have persistent abdominal pain may need repeat CT abdomen and pelvis.   C. difficile colitis No further diarrhea, plan to continue fidazomicin as planned.  Continue close monitoring of electrolytes and recurrence of diarrhea.    CKD stage 3b, GFR 30-44 ml/min (HCC) Renal function with stable serum cr, will add gently IV fluids with half normal saline to avoid hyperchloremia.  Hold on balanced electrolyte solution to prevent hyperkalemia.  Follow up renal function and electrolytes in am.  Avoid hypotension and nephrotoxic medications .  Renal transplant recipient, plan to  continue with tacrolimus, prednisone, and mycophenolate (reduced dose to 250 mg bid while on C diff treatment with fidaxomacin, when completed she will resume 750 mg bid dosing).    Metabolic bone disease, continue with calcitriol.   Essential hypertension, benign Continue blood pressure control with nifedipine and carvedilol.   Uncontrolled type 1  diabetes mellitus with hyperglycemia (HCC) Continue glucose cover and monitoring with insulin sliding scale.  Basal insulin, will be continued at lower dose of 15 units to avoid hypoglycemia, while her po intake is reduced because abdominal pain, nausea and vomiting.   Hyperlipidemia Continue with statin therapy   Seizures (HCC) No active seizures.       Advance Care Planning:   Code Status: Full Code   Consults: none   Family Communication: no family at the bedside   Severity of Illness: The appropriate patient status for this patient is OBSERVATION. Observation status is judged to be reasonable and necessary in order to provide the required intensity of service to ensure the patient's safety. The patient's presenting symptoms, physical exam findings, and initial radiographic and laboratory data in the context of their medical condition is felt to place them at decreased risk for further clinical deterioration. Furthermore, it is anticipated that the patient will be medically stable for discharge from the hospital within 2 midnights of admission.   Author: Coralie Keens, MD 06/11/2023 10:40 AM  For on call review [email or web address].

## 2023-06-11 NOTE — Assessment & Plan Note (Signed)
 Continue glucose cover and monitoring with insulin sliding scale.  Basal insulin, will be continued at lower dose of 15 units to avoid hypoglycemia, while her po intake is reduced because abdominal pain, nausea and vomiting.

## 2023-06-11 NOTE — Plan of Care (Signed)

## 2023-06-11 NOTE — ED Triage Notes (Signed)
 BIB EMS from home with abdominal pain that started this AM. Was discharged 3/28, diagnosed with a virus. H/o kidney transplant. Patient vomitting during triage.

## 2023-06-11 NOTE — ED Provider Notes (Signed)
 [place] EMERGENCY DEPARTMENT AT [facility] Provider Note   CSN: [id number] Arrival date & time: 06/11/23  8295     History  Chief Complaint  Patient presents with   Abdominal Pain    [name] is a 38 y.o. female.  Patient with medical history significant for left kidney transplant in December 2024 on immunosuppressants, followed by [facility], history of DVT on Eliquis, recent pyelonephritis involving transplant kidney, completed course of antibiotics, admission 3/25-3/28 for C. Diff, abd pain, intractable vomiting, on Fidaxomicin --re-presents this morning for evaluation of recurrent abdominal pain, intractable nausea and vomiting.  Patient states that she was eager to go home yesterday.  Per review of hospital records, symptoms were better controlled with stable kidney function.  Patient states that last night she is having difficulty tolerating her medications with recurrent vomiting which has become persistent.  She has been unable to keep down any of her medications including antibiotics for C. difficile.  EMS called for transport.  No fevers reported.  Abdominal pain is generalized.  No blood in the stool.       Home Medications Prior to Admission medications   Medication Sig Start Date End Date Taking? Authorizing Provider  calcitRIOL (ROCALTROL) 0.5 MCG capsule Take 0.5 mcg by mouth daily. 05/16/23   [provider]  Calcium Carbonate Antacid (CALCIUM CARBONATE, DOSED IN MG ELEMENTAL CALCIUM,) 1250 MG/5ML SUSP Take 500 mg of elemental calcium by mouth 3 (three) times daily. 05/15/23   [provider]  carvedilol (COREG) 12.5 MG tablet Take 12.5 mg by mouth 2 (two) times daily. 05/16/23   [provider]  conjugated estrogens (PREMARIN) vaginal cream Place 1 applicator vaginally daily as needed (for dryness). 05/23/23 05/22/24  [provider]  ELIQUIS 5 MG TABS tablet Take 5 mg by mouth 2 (two) times daily. 05/16/23   [provider]  famotidine (PEPCID) 20 MG tablet Take 1 tablet (20 mg total) by mouth 2 (two) times daily. 06/10/23   Barnetta Chapel, MD  fidaxomicin (DIFICID) 200 MG TABS tablet Take 1 tablet (200 mg total) by mouth 2 (two) times daily for 8 days. 06/10/23 06/18/23  Berton Mount I, MD  gabapentin (NEURONTIN) 300 MG capsule Take 300 mg by mouth at bedtime.    [provider]  hydrOXYzine (ATARAX) 25 MG tablet Take 25 mg by mouth daily as needed for itching. 05/16/23   [provider]  Insulin Aspart FlexPen (NOVOLOG) 100 UNIT/ML inject 4-10 units  under the skin with meals as directed.  Max 30 units per day 05/19/23   [provider]  insulin degludec (TRESIBA) 100 UNIT/ML FlexTouch Pen Inject 30 Units into the skin daily. 05/19/23 05/13/24  [provider]  mycophenolate (CELLCEPT) 250 MG capsule Take 1 capsule (250 mg total) by mouth 2 (two) times daily. 06/10/23   Berton Mount I, MD  NIFEdipine (PROCARDIA-XL/NIFEDICAL-XL) 30 MG 24 hr tablet Take 30 mg by mouth daily. 05/19/23 05/18/24  [provider]  oxyCODONE (OXY IR/ROXICODONE) 5 MG immediate release tablet Take 1 tablet (5 mg total) by mouth every 6 (six) hours as needed for moderate pain (pain score 4-6) or breakthrough pain. 06/10/23   Berton Mount I, MD  pravastatin (PRAVACHOL) 40 MG tablet Take 40 mg by mouth daily. 05/16/23   [provider]  predniSONE (DELTASONE) 5 MG tablet Take 5 mg by mouth daily with breakfast.    [provider]  sucralfate (CARAFATE) 1 g tablet TAKE 1 TABLET  BY MOUTH FOUR TIMES DAILY BEFORE MEALS AND NIGHTY. DISSOLVE IN TABLESPOON OF LIQUID OR APPLEASAUCE 05/31/23   [provider]  sulfamethoxazole-trimethoprim (BACTRIM) 400-80 MG tablet Take 1 tablet by mouth 3 (three) times a week. Monday, Wednesday and Fridays 05/16/23   [provider]  tacrolimus (PROGRAF) 5 MG capsule Take 1 capsule (5 mg total) by mouth 2 (two) times daily. 06/10/23   Barnetta Chapel, MD  valGANciclovir (VALCYTE) 450 MG tablet Take 1 tablet (450 mg total) by mouth daily. 06/11/23   Barnetta Chapel, MD      Allergies    Diphenhydramine, Peanut-containing drug products, Contrast media [iodinated contrast media], Shellfish allergy, Chlorhexidine, Ferrous sulfate, and Iron dextran    Review of Systems   Review of Systems  Physical Exam Updated Vital Signs BP (!) 156/116 (BP Location: Right Arm)   Pulse (!) 108   Temp 98.6 F (37 C) (Axillary)   Resp (!) 22   SpO2 100%  Physical Exam Vitals and nursing note reviewed.  Constitutional:      General: She is in acute distress (Actively vomiting).     Appearance: She is well-developed.     Comments: Pt holding emesis bag that is half full of non-bilious, non-bloody emesis.   HENT:     Head: Normocephalic and atraumatic.     Right Ear: External ear normal.     Left Ear: External ear normal.     Nose: Nose normal.  Eyes:     Conjunctiva/sclera: Conjunctivae normal.  Cardiovascular:     Rate and Rhythm: Regular rhythm. Tachycardia present.     Heart sounds: No murmur heard. Pulmonary:     Effort: No respiratory distress.     Breath sounds: No wheezing, rhonchi or rales.  Abdominal:     Palpations: Abdomen is soft.     Tenderness: There is abdominal tenderness. There is no guarding or rebound.  Musculoskeletal:     Cervical back: Normal range of motion and neck supple.     Right lower leg: No edema.     Left lower leg: No edema.  Skin:    General: Skin is warm and dry.     Findings: No rash.  Neurological:     General: No focal deficit present.     Mental Status: She is alert. Mental status is at baseline.     Motor: No weakness.  Psychiatric:        Mood and Affect: Mood normal.     ED Results / Procedures / Treatments   Labs (all labs ordered are listed, but only abnormal results are displayed) Labs Reviewed  CBC WITH DIFFERENTIAL/PLATELET - Abnormal; Notable for the following  components:      Result Value   RBC 3.68 (*)    Hemoglobin 10.6 (*)    HCT 33.4 (*)    All other components within normal limits  COMPREHENSIVE METABOLIC PANEL WITH GFR - Abnormal; Notable for the following components:   CO2 21 (*)    Glucose, Bld 246 (*)    BUN 30 (*)    Creatinine, Ser 1.60 (*)    Calcium 8.8 (*)    Total Protein 8.9 (*)    GFR, Estimated 42 (*)    All other components within normal limits  URINALYSIS, ROUTINE W REFLEX MICROSCOPIC - Abnormal; Notable for the following components:   APPearance HAZY (*)    Glucose, UA >=500 (*)    All other components within normal limits  LIPASE, BLOOD  HCG, SERUM, QUALITATIVE  MAGNESIUM    EKG None  Radiology No results found.  Procedures Procedures    Medications Ordered in ED Medications  promethazine (PHENERGAN) 25 mg in sodium chloride 0.9 % 50 mL IVPB (has no administration in time range)  0.9 %  sodium chloride infusion (has no administration in time range)  morphine (PF) 4 MG/ML injection 4 mg (has no administration in time range)  sodium chloride 0.9 % bolus 1,000 mL (0 mLs Intravenous Stopped 06/11/23 0954)  morphine (PF) 4 MG/ML injection 4 mg (4 mg Intravenous Given 06/11/23 0834)  ondansetron (ZOFRAN) injection 4 mg (4 mg Intravenous Given 06/11/23 9562)    ED Course/ Medical Decision Making/ A&P    Patient seen and examined. History obtained directly from patient.  Reviewed most recent hospitalization records.  CT scan from several days ago without any acute findings.  Labs/EKG: Ordered CBC, CMP, lipase, UA, pregnancy, magnesium.  Recent tacrolimus level was normal.  EKG given history of prolonged QT.  Imaging: None ordered  Medications/Fluids: Ordered: IV fluid bolus, IV morphine, IV Zofran.  Patient was being treated with Phenergan in the hospital.  Most recent vital signs reviewed and are as follows: BP (!) 156/116 (BP Location: Right Arm)   Pulse (!) 108   Temp 98.6 F (37 C) (Axillary)    Resp (!) 22   SpO2 100%   Initial impression: C. difficile colitis, intractable nausea and vomiting, history of immunocompromise and renal transplant  9:57 AM Reassessment performed. Patient appears.  She continues to have severe nausea, uncontrolled abdominal pain and little bit of vomiting.  She is willing to be readmitted.  Labs personally reviewed and interpreted including: CBC with normal white blood cell count, hemoglobin slightly increased from previous possibly due to hemoconcentration; CMP glucose 246 with creatinine at 1.6 with an BUN of 30, normal anion gap; UA without signs of infection.  Reviewed pertinent lab work and imaging with patient at bedside. Questions answered.   Most current vital signs reviewed and are as follows: BP (!) 174/127   Pulse (!) 104   Temp 98.6 F (37 C) (Axillary)   Resp (!) 23   SpO2 95%   Plan: Will consult with hospitalist for readmission.  Patient symptoms are uncontrolled and she is high risk for decompensation given renal transplant, diabetes.  She has been unable to take her immunosuppressive therapy, antibiotics for C. difficile, anticoagulation.  10:04 AM Discussed case with Dr. Ella Jubilee with [facility] who will see patient.                                  Medical Decision Making Amount and/or Complexity of Data Reviewed Labs: ordered.  Risk Prescription drug management. Decision regarding hospitalization.   For this patient's complaint of abdominal pain, the following conditions were considered on the differential diagnosis: gastritis/PUD, enteritis/duodenitis, appendicitis, cholelithiasis/cholecystitis, cholangitis, pancreatitis, ruptured viscus, colitis, diverticulitis, small/large bowel obstruction, proctitis, cystitis, pyelonephritis, ureteral colic, aortic dissection, aortic aneurysm. In women, ectopic pregnancy, pelvic inflammatory disease, ovarian cysts, and tubo-ovarian abscess were also considered. Atypical chest etiologies were  also considered including ACS, PE, and pneumonia.  At this point, low concern for acute rejection of renal transplant.  Kidney function appears to be stable.  Her abdominal pain is generalized.        Final Clinical Impression(s) / ED Diagnoses Final diagnoses:  Intractable nausea and vomiting  Pain of upper abdomen  Rx / DC Orders ED Discharge Orders     None         Renne Crigler, PA-C 06/11/23 1005    Royanne Foots, [place] 06/12/23 [id number]

## 2023-06-11 NOTE — Assessment & Plan Note (Signed)
 Continue with statin therapy.  ?

## 2023-06-11 NOTE — Assessment & Plan Note (Signed)
No active seizures.  Continue with keppra, valproic acid and lacosamide.  

## 2023-06-11 NOTE — Assessment & Plan Note (Signed)
 No further diarrhea, plan to continue fidazomicin as planned.  Continue close monitoring of electrolytes and recurrence of diarrhea.

## 2023-06-11 NOTE — Assessment & Plan Note (Signed)
 Patient with severe reflux symptoms. Her abdominal radiograph has no signs of bowel obstruction.   Plan to start patient on pantoprazole IV bid and continue sucralfate.  Pain control with as needed acetaminophen and IV hydromorphone.  Clear liquid diet for now.  Close follow up on response to treatment, if continue to have persistent abdominal pain may need repeat CT abdomen and pelvis.

## 2023-06-11 NOTE — Assessment & Plan Note (Addendum)
 Renal function with stable serum cr, will add gently IV fluids with half normal saline to avoid hyperchloremia.  Hold on balanced electrolyte solution to prevent hyperkalemia.  Follow up renal function and electrolytes in am.  Avoid hypotension and nephrotoxic medications .  Renal transplant recipient, plan to continue with tacrolimus, prednisone, and mycophenolate (reduced dose to 250 mg bid while on C diff treatment with fidaxomacin, when completed she will resume 750 mg bid dosing).    Metabolic bone disease, continue with calcitriol.

## 2023-06-12 DIAGNOSIS — R569 Unspecified convulsions: Secondary | ICD-10-CM | POA: Diagnosis present

## 2023-06-12 DIAGNOSIS — Z5986 Financial insecurity: Secondary | ICD-10-CM | POA: Diagnosis not present

## 2023-06-12 DIAGNOSIS — K2100 Gastro-esophageal reflux disease with esophagitis, without bleeding: Secondary | ICD-10-CM | POA: Diagnosis present

## 2023-06-12 DIAGNOSIS — E1065 Type 1 diabetes mellitus with hyperglycemia: Secondary | ICD-10-CM | POA: Diagnosis present

## 2023-06-12 DIAGNOSIS — R112 Nausea with vomiting, unspecified: Secondary | ICD-10-CM | POA: Diagnosis not present

## 2023-06-12 DIAGNOSIS — Z6829 Body mass index (BMI) 29.0-29.9, adult: Secondary | ICD-10-CM | POA: Diagnosis not present

## 2023-06-12 DIAGNOSIS — Z5982 Transportation insecurity: Secondary | ICD-10-CM | POA: Diagnosis not present

## 2023-06-12 DIAGNOSIS — Z8261 Family history of arthritis: Secondary | ICD-10-CM | POA: Diagnosis not present

## 2023-06-12 DIAGNOSIS — E1043 Type 1 diabetes mellitus with diabetic autonomic (poly)neuropathy: Secondary | ICD-10-CM | POA: Diagnosis present

## 2023-06-12 DIAGNOSIS — A0472 Enterocolitis due to Clostridium difficile, not specified as recurrent: Secondary | ICD-10-CM | POA: Diagnosis present

## 2023-06-12 DIAGNOSIS — K219 Gastro-esophageal reflux disease without esophagitis: Secondary | ICD-10-CM | POA: Diagnosis not present

## 2023-06-12 DIAGNOSIS — Z8719 Personal history of other diseases of the digestive system: Secondary | ICD-10-CM | POA: Diagnosis not present

## 2023-06-12 DIAGNOSIS — Z8249 Family history of ischemic heart disease and other diseases of the circulatory system: Secondary | ICD-10-CM | POA: Diagnosis not present

## 2023-06-12 DIAGNOSIS — E785 Hyperlipidemia, unspecified: Secondary | ICD-10-CM | POA: Diagnosis present

## 2023-06-12 DIAGNOSIS — D638 Anemia in other chronic diseases classified elsewhere: Secondary | ICD-10-CM | POA: Diagnosis present

## 2023-06-12 DIAGNOSIS — R110 Nausea: Secondary | ICD-10-CM | POA: Diagnosis present

## 2023-06-12 DIAGNOSIS — Z940 Kidney transplant status: Secondary | ICD-10-CM | POA: Diagnosis not present

## 2023-06-12 DIAGNOSIS — D84821 Immunodeficiency due to drugs: Secondary | ICD-10-CM | POA: Diagnosis present

## 2023-06-12 DIAGNOSIS — Z7901 Long term (current) use of anticoagulants: Secondary | ICD-10-CM | POA: Diagnosis not present

## 2023-06-12 DIAGNOSIS — Z794 Long term (current) use of insulin: Secondary | ICD-10-CM | POA: Diagnosis not present

## 2023-06-12 DIAGNOSIS — E669 Obesity, unspecified: Secondary | ICD-10-CM | POA: Diagnosis present

## 2023-06-12 DIAGNOSIS — Z833 Family history of diabetes mellitus: Secondary | ICD-10-CM | POA: Diagnosis not present

## 2023-06-12 DIAGNOSIS — K3184 Gastroparesis: Secondary | ICD-10-CM | POA: Diagnosis present

## 2023-06-12 DIAGNOSIS — Z5941 Food insecurity: Secondary | ICD-10-CM | POA: Diagnosis not present

## 2023-06-12 DIAGNOSIS — Z8619 Personal history of other infectious and parasitic diseases: Secondary | ICD-10-CM | POA: Diagnosis not present

## 2023-06-12 DIAGNOSIS — K222 Esophageal obstruction: Secondary | ICD-10-CM | POA: Diagnosis present

## 2023-06-12 DIAGNOSIS — M898X9 Other specified disorders of bone, unspecified site: Secondary | ICD-10-CM | POA: Diagnosis present

## 2023-06-12 DIAGNOSIS — Z86718 Personal history of other venous thrombosis and embolism: Secondary | ICD-10-CM | POA: Diagnosis not present

## 2023-06-12 LAB — MAGNESIUM: Magnesium: 1.5 mg/dL — ABNORMAL LOW (ref 1.7–2.4)

## 2023-06-12 LAB — CBC
HCT: 28.6 % — ABNORMAL LOW (ref 36.0–46.0)
Hemoglobin: 8.9 g/dL — ABNORMAL LOW (ref 12.0–15.0)
MCH: 28.3 pg (ref 26.0–34.0)
MCHC: 31.1 g/dL (ref 30.0–36.0)
MCV: 90.8 fL (ref 80.0–100.0)
Platelets: 153 10*3/uL (ref 150–400)
RBC: 3.15 MIL/uL — ABNORMAL LOW (ref 3.87–5.11)
RDW: 15.5 % (ref 11.5–15.5)
WBC: 3.7 10*3/uL — ABNORMAL LOW (ref 4.0–10.5)
nRBC: 0 % (ref 0.0–0.2)

## 2023-06-12 LAB — C-REACTIVE PROTEIN: CRP: 0.9 mg/dL (ref <1.0)

## 2023-06-12 LAB — CULTURE, BLOOD (ROUTINE X 2): Culture: NO GROWTH

## 2023-06-12 LAB — BASIC METABOLIC PANEL WITH GFR
Anion gap: 8 (ref 5–15)
BUN: 22 mg/dL — ABNORMAL HIGH (ref 6–20)
CO2: 23 mmol/L (ref 22–32)
Calcium: 7.6 mg/dL — ABNORMAL LOW (ref 8.9–10.3)
Chloride: 104 mmol/L (ref 98–111)
Creatinine, Ser: 1.34 mg/dL — ABNORMAL HIGH (ref 0.44–1.00)
GFR, Estimated: 52 mL/min — ABNORMAL LOW (ref >60)
Glucose, Bld: 161 mg/dL — ABNORMAL HIGH (ref 70–99)
Potassium: 4.4 mmol/L (ref 3.5–5.1)
Sodium: 135 mmol/L (ref 135–145)

## 2023-06-12 LAB — PROCALCITONIN: Procalcitonin: <0.1 ng/mL

## 2023-06-12 MED ORDER — SODIUM CHLORIDE 0.45 % IV SOLN: INTRAVENOUS | Status: AC

## 2023-06-12 MED ORDER — HYDRALAZINE HCL 20 MG/ML IJ SOLN: 10.0000 mg | Freq: Once | INTRAMUSCULAR | Status: DC

## 2023-06-12 MED ORDER — PROCHLORPERAZINE EDISYLATE 10 MG/2ML IJ SOLN
10.0000 mg | Freq: Four times a day (QID) | INTRAMUSCULAR | Status: DC | PRN
  Administered 2023-06-12 – 2023-06-14 (×3): 10 mg via INTRAVENOUS
  Filled 2023-06-12 (×3): qty 2

## 2023-06-12 MED ORDER — HYDRALAZINE HCL 20 MG/ML IJ SOLN
10.0000 mg | Freq: Four times a day (QID) | INTRAMUSCULAR | Status: DC | PRN
  Filled 2023-06-12: qty 1

## 2023-06-12 MED ORDER — FAMOTIDINE 20 MG PO TABS
20.0000 mg | ORAL_TABLET | Freq: Two times a day (BID) | ORAL | Status: DC
  Administered 2023-06-12 – 2023-06-14 (×4): 20 mg via ORAL
  Filled 2023-06-12 (×4): qty 1

## 2023-06-12 MED ORDER — MAGNESIUM SULFATE 4 GM/100ML IV SOLN: 4.0000 g | Freq: Once | INTRAVENOUS | Status: DC

## 2023-06-12 NOTE — Progress Notes (Signed)
 Throughout the day patient was refusing multiple oral medications and only requesting for IV pain medications.  She is very irritable and belligerent towards the nursing staff in the daytime. Tonight patient is denying to keep the telemetry on. However found out that patient blood pressure is persistently elevated even though patient is on Procardia XL and Coreg. Starting hydralazine as needed.

## 2023-06-12 NOTE — Progress Notes (Addendum)
 Patient refuses all oral medication reporting pain in her abdomen when she takes oral medication. Patient requests only wanting to receive morphine, dilaudid and if there is anything stronger to take. Patient advised on the discontinuation of the dilaudid and morphine. Patient educated on the risks versus benefits of taking these medications. MD made aware. Patient has eaten 100% of her breakfast this morning and requests a second tray. Patient refuses IV phenergan and zofran. MD is aware. Patient is irritable, boisterous, using vulgar language and is raising voice at staff members requesting alternate staff members that can offer her pain medication. Borset scoring 3.

## 2023-06-12 NOTE — Progress Notes (Signed)
 PROGRESS NOTE                                                                                                                                                                                                             Patient Demographics:    [name], is a 38 y.o. female, DOB - 1985/06/16, WUJ:[id number]  Outpatient Primary MD for the patient is Fleet Contras, MD    LOS - 0  Admit date - 06/11/2023    Chief Complaint  Patient presents with   Abdominal Pain       Brief Narrative (HPI from H&P)    38 y.o. female with medical history significant for left kidney transplant in December 2024 on immunosuppressants, followed by [facility], history of DVT on Eliquis, recent pyelonephritis involving transplant kidney, completed course of antibiotics who initially presented at [facility] ED with complaints of intractable nausea, vomiting, abdominal pain, and diarrhea. CT a/p on 06/06/2023 showing no acute localizing process in the abdomen or pelvis.  Stool test returned positive for both C. difficile and Norovirus. Due to the intractable nausea and vomiting she was unable to keep her immunosuppressants down for the past 2 days. In the ER, tachycardic and tachypneic.  She received IV fluid bolus 1 L NS, IV antiemetics, IV opiate based analgesics and her home transplant medications, immunosuppressants were restarted.  Admitted by [facility], hospitalist service, for intractable nausea and vomiting and treatment for C. difficile diarrhea.    Subjective:    [name] today has, No headache, No chest pain, +ve abdominal pain - No Nausea, No new weakness tingling or numbness, no SOB   Assessment  & Plan :    GERD with esophagitis  Patient with severe reflux symptoms. Her abdominal radiograph has no signs of bowel obstruction.  Was given few doses of IV PPI and Carafate, symptoms have improved, since she has recent C. difficile infection we will  discontinue PPI and place her on Pepcid, continue Carafate, advance diet to soft, abdominal exam is benign.  Continue to monitor closely.  Some narcotic seeking behavior as well, will monitor closely currently no discomfort but asking for some narcotics.  C. difficile colitis No further diarrhea, plan to continue fidazomicin as planned.  Afebrile, no leukocytosis normal CRP.  CKD stage 3b, GFR 30-44 ml/min (HCC) - Renal function with stable serum cr,  gently hydrate, advance oral diet.  Renal transplant recipient, plan to continue with tacrolimus, prednisone, and mycophenolate (reduced dose to 250 mg bid while on C diff treatment with fidaxomacin, when completed she will resume 750 mg bid dosing).    Metabolic bone disease, continue with calcitriol.   Essential hypertension, benign Continue blood pressure control with nifedipine and carvedilol.   Hyperlipidemia  Continue with statin therapy   Seizures (HCC)  No active seizures.   Obesity: Estimated body mass index is 29.38 kg/m, follow-up with PCP.  History of DVT  Resume home Eliquis  AOCD.  Stable.    Uncontrolled type 1 diabetes mellitus with hyperglycemia (HCC) currently on long-acting insulin along with sliding scale monitor and adjust.    Lab Results  Component Value Date   HGBA1C 12.1 (H) 06/09/2023    CBG (last 3)  Recent Labs    06/09/23 2036 06/10/23 0807 06/10/23 1220  GLUCAP 304* 210* 272*            Condition - Fair  Family Communication  :  None  Code Status :   Full  Consults  :   None  PUD Prophylaxis :     Procedures  :            Disposition Plan  :    Status is: Observation  DVT Prophylaxis  :    SCDs Start: 06/11/23 1322 apixaban (ELIQUIS) tablet 5 mg     Lab Results  Component Value Date   PLT 153 06/12/2023    Diet :  Diet Order             DIET SOFT Fluid consistency: Thin  Diet effective now                    Inpatient Medications  Scheduled Meds:   apixaban  5 mg Oral BID   calcitRIOL  0.5 mcg Oral Daily   calcium carbonate (dosed in mg elemental calcium)  500 mg of elemental calcium Oral TID   carvedilol  12.5 mg Oral BID   fidaxomicin  200 mg Oral BID   gabapentin  300 mg Oral QHS   mycophenolate  250 mg Oral BID   NIFEdipine  30 mg Oral Daily   pantoprazole (PROTONIX) IV  40 mg Intravenous Q12H   pravastatin  40 mg Oral Daily   predniSONE  5 mg Oral Q breakfast   sucralfate  1 g Oral TID WC & HS   tacrolimus  5 mg Oral BID   valGANciclovir  450 mg Oral Daily   Continuous Infusions:  sodium chloride     magnesium sulfate bolus IVPB     promethazine (PHENERGAN) injection (IM or IVPB) 25 mg (06/12/23 0818)   PRN Meds:.acetaminophen **OR** acetaminophen, hydrOXYzine, mouth rinse, oxyCODONE, promethazine (PHENERGAN) injection (IM or IVPB)  Antibiotics  :    Anti-infectives (From admission, onward)    Start     Dose/Rate Route Frequency Ordered Stop   06/11/23 1415  fidaxomicin (DIFICID) tablet 200 mg        200 mg Oral 2 times daily 06/11/23 1322     06/11/23 1415  valGANciclovir (VALCYTE) 450 MG tablet TABS 450 mg        450 mg Oral Daily 06/11/23 1322           Objective:   Vitals:   06/11/23 1542 06/11/23 1607 06/11/23 2016 06/12/23 0808  BP: (!) 138/94 (!) 195/107 (!) 161/116 (!) 160/127  Pulse: 90 (!)  105  89  Resp: 12 11  14   Temp: 98.6 F (37 C) 97.6 F (36.4 C) 98.2 F (36.8 C) 98.2 F (36.8 C)  TempSrc: Oral Oral  Oral  SpO2: 95% 100% 100% 99%  Weight:      Height:        Wt Readings from Last 3 Encounters:  06/11/23 82.6 kg  06/07/23 86.2 kg  05/07/23 81.6 kg     Intake/Output Summary (Last 24 hours) at 06/12/2023 0916 Last data filed at 06/11/2023 1521 Gross per 24 hour  Intake 0 ml  Output --  Net 0 ml     Physical Exam  Awake Alert, No new F.N deficits, Normal affect [place].AT,PERRAL Supple Neck, No JVD,   Symmetrical Chest wall movement, Good air movement bilaterally, CTAB RRR,No  Gallops,Rubs or new Murmurs,  +ve B.Sounds, Abd Soft, No tenderness,   No Cyanosis, Clubbing or edema       Data Review:    Recent Labs  Lab 06/06/23 1748 06/07/23 1608 06/08/23 0409 06/09/23 0352 06/10/23 0632 06/11/23 0815 06/12/23 0546  WBC 5.1   < > 4.2 3.4* 2.9* 5.9 3.7*  HGB 10.8*   < > 9.0* 8.4* 8.2* 10.6* 8.9*  HCT 33.9*   < > 28.1* 26.3* 25.5* 33.4* 28.6*  PLT 202   < > 153 147* 147* 193 153  MCV 89.7   < > 90.4 90.1 89.5 90.8 90.8  MCH 28.6   < > 28.9 28.8 28.8 28.8 28.3  MCHC 31.9   < > 32.0 31.9 32.2 31.7 31.1  RDW 15.5   < > 15.3 15.0 15.1 15.1 15.5  LYMPHSABS 0.5*  --   --   --  0.4* 0.7  --   MONOABS 0.5  --   --   --  0.3 0.4  --   EOSABS 0.1  --   --   --  0.1 0.1  --   BASOSABS 0.1  --   --   --  0.1 0.1  --    < > = values in this interval not displayed.    Recent Labs  Lab 06/06/23 1748 06/07/23 1608 06/07/23 1759 06/08/23 0409 06/09/23 0352 06/09/23 1516 06/10/23 0632 06/11/23 0815 06/12/23 0545 06/12/23 0546  NA 134* 134*  --  137 131* 136 136 136  --  135  K 5.0 4.5  --  4.2 5.7* 4.9 4.2 4.4  --  4.4  CL 102 104  --  105 103 104 103 102  --  104  CO2 24 20*  --  23 23 24 24  21*  --  23  ANIONGAP 8 10  --  9 5 8 9 13   --  8  GLUCOSE 316* 323*  --  209* 457* 106* 208* 246*  --  161*  BUN 27* 22*  --  21* 23* 25* 23* 30*  --  22*  CREATININE 1.54* 1.50*  --  1.48* 1.59* 1.59* 1.41* 1.60*  --  1.34*  AST 12* 16  --   --   --   --   --  29  --   --   ALT 12 14  --   --   --   --   --  21  --   --   ALKPHOS 78 67  --   --   --   --   --  58  --   --   BILITOT 0.5 0.8  --   --   --   --   --  0.5  --   --   ALBUMIN 4.5 4.0  --   --  3.2*  --   --  4.2  --   --   CRP  --   --   --   --   --   --   --   --  0.9  --   PROCALCITON  --   --   --   --   --   --   --   --  <0.10  --   LATICACIDVEN  --   --  1.0  --   --   --   --   --   --   --   HGBA1C  --   --   --   --  12.1*  --   --   --   --   --   MG  --   --   --  1.5*  --   --   --  1.7  1.5*  --   PHOS  --   --   --  3.3 3.2  --   --   --   --   --   CALCIUM 8.9 8.6*  --  8.2* 7.8* 8.5* 7.8* 8.8*  --  7.6*      Recent Labs  Lab 06/07/23 1759 06/08/23 0409 06/09/23 0352 06/09/23 1516 06/10/23 0981 06/11/23 0815 06/12/23 0545 06/12/23 0546  CRP  --   --   --   --   --   --  0.9  --   PROCALCITON  --   --   --   --   --   --  <0.10  --   LATICACIDVEN 1.0  --   --   --   --   --   --   --   HGBA1C  --   --  12.1*  --   --   --   --   --   MG  --  1.5*  --   --   --  1.7 1.5*  --   CALCIUM  --  8.2* 7.8* 8.5* 7.8* 8.8*  --  7.6*    --------------------------------------------------------------------------------------------------------------- Lab Results  Component Value Date   CHOL 162 03/23/2022   HDL 28 (L) 03/23/2022   LDLCALC  03/23/2022     Comment:     . LDL cholesterol not calculated. Triglyceride levels greater than 400 mg/dL invalidate calculated LDL results. . Reference range: <100 . Desirable range <100 mg/dL for primary prevention;   <70 mg/dL for patients with CHD or diabetic patients  with > or = 2 CHD risk factors. Marland Kitchen LDL-C is now calculated using the Martin-Hopkins  calculation, which is a validated novel method providing  better accuracy than the Friedewald equation in the  estimation of LDL-C.  Horald Pollen et al. Lenox Ahr. 1914;782(95): 2061-2068  (http://education.[employer].com/faq/FAQ164)    LDLDIRECT 65 01/20/2012   TRIG 579 (H) 03/23/2022   CHOLHDL 5.8 (H) 03/23/2022    Lab Results  Component Value Date   HGBA1C 12.1 (H) 06/09/2023   Radiology Report DG Abd 1 View Result Date: 06/11/2023 CLINICAL DATA:  [id number] Pain [id number] EXAM: ABDOMEN - 1 VIEW COMPARISON:  06/02/2021. FINDINGS: The bowel gas pattern is non-obstructive. Small to moderate amount of stool burden throughout the colon. No evidence of pneumoperitoneum, within the limitations of a supine film. No acute osseous abnormalities. The soft tissues are within normal  limits. Surgical changes, devices,  tubes and lines: There are surgical clips in the right upper quadrant, typical of a previous cholecystectomy. IMPRESSION: *Nonobstructive bowel gas pattern. Electronically Signed   By: Jules Schick M.D.   On: 06/11/2023 11:36     Signature  -   Susa Raring M.D on 06/12/2023 at 9:16 AM   -  To page go to [email or web address]

## 2023-06-12 NOTE — Progress Notes (Signed)
 Patient BP elevated 190/130's, having nausea, vomiting, and abdominal pain but refuses phenergan and oxycodone.  PM coreg was given but possible patient vomited it out before absorption. Dr Janalyn Shy made aware, new orders received.  BP at this time 152/105, will reevaluate 45 minutes post mediation admin.

## 2023-06-12 NOTE — Plan of Care (Signed)
 Patient resting well through the night.  Patient understands care plan.

## 2023-06-12 NOTE — Plan of Care (Signed)

## 2023-06-13 ENCOUNTER — Inpatient Hospital Stay (HOSPITAL_COMMUNITY)

## 2023-06-13 DIAGNOSIS — K3184 Gastroparesis: Secondary | ICD-10-CM

## 2023-06-13 DIAGNOSIS — K2100 Gastro-esophageal reflux disease with esophagitis, without bleeding: Secondary | ICD-10-CM | POA: Diagnosis not present

## 2023-06-13 DIAGNOSIS — R112 Nausea with vomiting, unspecified: Secondary | ICD-10-CM | POA: Diagnosis not present

## 2023-06-13 DIAGNOSIS — Z8619 Personal history of other infectious and parasitic diseases: Secondary | ICD-10-CM

## 2023-06-13 LAB — BASIC METABOLIC PANEL WITH GFR
Anion gap: 7 (ref 5–15)
BUN: 19 mg/dL (ref 6–20)
CO2: 24 mmol/L (ref 22–32)
Calcium: 7.8 mg/dL — ABNORMAL LOW (ref 8.9–10.3)
Chloride: 104 mmol/L (ref 98–111)
Creatinine, Ser: 1.37 mg/dL — ABNORMAL HIGH (ref 0.44–1.00)
GFR, Estimated: 50 mL/min — ABNORMAL LOW (ref >60)
Glucose, Bld: 212 mg/dL — ABNORMAL HIGH (ref 70–99)
Potassium: 4.2 mmol/L (ref 3.5–5.1)
Sodium: 135 mmol/L (ref 135–145)

## 2023-06-13 LAB — CBC WITH DIFFERENTIAL/PLATELET
Abs Immature Granulocytes: 0.01 10*3/uL (ref 0.00–0.07)
Basophils Absolute: 0.1 10*3/uL (ref 0.0–0.1)
Basophils Relative: 2 %
Eosinophils Absolute: 0.1 10*3/uL (ref 0.0–0.5)
Eosinophils Relative: 3 %
HCT: 29.3 % — ABNORMAL LOW (ref 36.0–46.0)
Hemoglobin: 9.6 g/dL — ABNORMAL LOW (ref 12.0–15.0)
Immature Granulocytes: 0 %
Lymphocytes Relative: 12 %
Lymphs Abs: 0.6 10*3/uL — ABNORMAL LOW (ref 0.7–4.0)
MCH: 28.9 pg (ref 26.0–34.0)
MCHC: 32.8 g/dL (ref 30.0–36.0)
MCV: 88.3 fL (ref 80.0–100.0)
Monocytes Absolute: 0.4 10*3/uL (ref 0.1–1.0)
Monocytes Relative: 9 %
Neutro Abs: 3.4 10*3/uL (ref 1.7–7.7)
Neutrophils Relative %: 74 %
Platelets: 169 10*3/uL (ref 150–400)
RBC: 3.32 MIL/uL — ABNORMAL LOW (ref 3.87–5.11)
RDW: 15 % (ref 11.5–15.5)
WBC: 4.6 10*3/uL (ref 4.0–10.5)
nRBC: 0 % (ref 0.0–0.2)

## 2023-06-13 LAB — BRAIN NATRIURETIC PEPTIDE: B Natriuretic Peptide: 18.9 pg/mL (ref 0.0–100.0)

## 2023-06-13 LAB — PHOSPHORUS: Phosphorus: 3.6 mg/dL (ref 2.5–4.6)

## 2023-06-13 LAB — C-REACTIVE PROTEIN: CRP: 1.1 mg/dL — ABNORMAL HIGH (ref <1.0)

## 2023-06-13 LAB — MAGNESIUM: Magnesium: 1.6 mg/dL — ABNORMAL LOW (ref 1.7–2.4)

## 2023-06-13 LAB — PROCALCITONIN: Procalcitonin: <0.1 ng/mL

## 2023-06-13 MED ORDER — MAGNESIUM SULFATE 4 GM/100ML IV SOLN
4.0000 g | Freq: Once | INTRAVENOUS | Status: AC
  Administered 2023-06-13: 4 g via INTRAVENOUS
  Filled 2023-06-13: qty 100

## 2023-06-13 MED ORDER — MORPHINE SULFATE (PF) 2 MG/ML IV SOLN
0.5000 mg | Freq: Once | INTRAVENOUS | Status: AC
  Administered 2023-06-13: 0.5 mg via INTRAVENOUS
  Filled 2023-06-13: qty 1

## 2023-06-13 NOTE — Consult Note (Signed)
 Consultation Note   Referring Provider:  [facility] PCP: Fleet Contras, MD Primary Gastroenterologist::  Stan Head, MD. Also [facility]  ( we referred her there for management of esophageal stricture)      Reason for Consultation:  Nausea and vomiting DOA: 06/11/2023         Hospital Day: 3   ASSESSMENT    38 y.o. year old female with ESRD s/p transplant as well as other medical problems who was recently hospitalized with C-diff. Symptoms improved, discharged home but now readmitted with N/V  C-difficile and Norovirus.  On Dificid for C-diff. Diarrhea resolved.   Nausea and vomiting with underlying gastroparesis. Suspect a flare of gastroparesis in setting of C-diff and norovirus  GERD with history of severe esophagitis and difficult esophageal stricture requiring dilations / hiatal hernia.  Followed by [facility]. Takes PPI at home. No dysphagia in last several months  History of DVT, Eliquis  ESRD s/p transplant Dec 2024 . Followed by [facility]  See PMH for additional history     PLAN:   --Add low dose Reglan 5 mg BID --Continue compazine prn.  --Complete the 10 day course of Dificid .  --Continue Pepcid 20 mg BID ( avoiding PPI in setting of C-diff).  --Continue Carafate TID --Hamburger and baked potato on tray that just arrived to room. Tolerated the mash potato. Will downgrade diet to fulls  HPI   [name] was hospitalized a few days ago with N/V/diarrhea and abdominal pain. Stool test returned positive for both C. difficile and Norovirus. Treated with Dificid, IV fluids. PPI was not given due to C-diff but given carafate and maalox for history of esophagitis. Discharged home on 3/28. Prescribed 6 days of Dificid at discharge. She returned to ED with persistent N/V. Diarrhea resolved. She has gastroparesis, manages with small frequent meals at home. Today she is having some mild generalized abdominal discomfort. The nausea and  vomiting persists even though she has been NPO today. Emesis described as yellow fluid.  No hematemesis. No blood in stool.     Prior Endoscopies / GI evaluation. Some records retrieved from [facility]  EGD ( [facility]) Oct 2024 in [facility] Impression: - LA Grade D reflux esophagitis with no bleeding. - Small hiatal hernia. - Discolored, erythematous and hemorrhagic appearing  mucosa in the cardia and gastric fundus. - The examination was otherwise normal. - No specimens collected.   EGD Sept 2024 at [facility] Impression: - Benign-appearing esophageal stenosis. Dilated. - A medium amount of food (residue) in the stomach. - Normal examined duodenum. - FLIP imaging performed. - No specimens collected. Recommendation: - Repeat upper endoscopy in 2 weeks for esophageal  dilation. - Follow up with me in clinic to discuss pylorus  interventions   4 hour Gastric emptying study Nov 2023 ( [facility]) Findings :  The stomach is located normally in the left upper abdomen.  Solid gastric emptying at   64 minutes is 7% (normal range at  60 minutes is >10%).  Solid gastric emptying at 122 minutes is 13% (normal range at 120 minutes is >40%).  Solid gastric emptying at 247 minutes is 47% (normal range at 240 minutes is >90%).  Impression :  Delayed solid gastric emptying; see  above discussion.    Summary from Dr. Marvell Fuller 06/23/21 office note.  Recent history pertinent for discovery of a very tight esophageal stricture at January 06, 2021 EGD.  Biopsies showing inflammation.  She has had repeat EGD and dilations on November 7, November 22, November 29, December 8, and March 12, 2021.  We will progressively dilating her though there was a persistent inflammation in the distal esophagus, dilated up to 15 mm by Dr. Leonides Schanz on 02/19/2021.  Repeat EGD on December 29 by Dr. Barron Alvine showed that the stricture had regressed and deteriorated and the scope passed with gentle pressure relieving a  laceration in the wall of the esophagus so no balloon dilation was undertaken.     Labs and Imaging:  Recent Labs    06/11/23 0815 06/12/23 0546 06/13/23 0457  WBC 5.9 3.7* 4.6  HGB 10.6* 8.9* 9.6*  HCT 33.4* 28.6* 29.3*  MCV 90.8 90.8 88.3  PLT 193 153 169   No results for input(s): "FOLATE", "VITAMINB12", "FERRITIN", "TIBC", "IRONPCTSAT" in the last 72 hours. Recent Labs    06/11/23 0815 06/12/23 0546 06/13/23 0457  NA 136 135 135  K 4.4 4.4 4.2  CL 102 104 104  CO2 21* 23 24  GLUCOSE 246* 161* 212*  BUN 30* 22* 19  CREATININE 1.60* 1.34* 1.37*  CALCIUM 8.8* 7.6* 7.8*   Recent Labs    06/11/23 0815  PROT 8.9*  ALBUMIN 4.2  AST 29  ALT 21  ALKPHOS 58  BILITOT 0.5   No results for input(s): "INR" in the last 72 hours. No results for input(s): "AFPTUMOR" in the last 72 hours.  DG Abd Portable 1V CLINICAL DATA:  [id number] Nausea AND vomiting [id number]  EXAM: PORTABLE ABDOMEN - 1 VIEW  COMPARISON:  KUB, 06/11/2023 CT AP, 06/06/2023.  FINDINGS: The bowel gas pattern is normal. Cholecystectomy clips, and additional surgical clips within the pelvis. No radio-opaque calculi or other significant radiographic abnormality are seen.  IMPRESSION: Nonobstructed bowel-gas pattern.  Electronically Signed   By: Roanna Banning M.D.   On: 06/13/2023 07:04    Past Medical History:  Diagnosis Date   Anemia    Blood transfusion without reported diagnosis    Cellulitis of left leg 03/01/2018   Chronic kidney disease    kidney transplant 07   Diabetes mellitus    Pt reports diagnosis in June 2011, Type 2   Diabetes mellitus without complication (HCC)    Esophageal obstruction due to food impaction    GERD (gastroesophageal reflux disease)    Hyperlipidemia    Hypertension    Intra-abdominal abscess (HCC) 10/28/2018   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   LEARNING DISABILITY 09/25/2007   Qualifier: Diagnosis of  By: Dayton Martes MD, Talia     Nausea and  vomiting    Prolonged Q-T interval on ECG    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   Pyelonephritis 06/23/2014   Renal and perinephric abscess 11/01/2018   Renal disorder    Seasonal allergies    Seizures (HCC)    UTI (urinary tract infection) 01/09/2015   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   Qualifier: Diagnosis of  By: Sandi Mealy  MD, Judeth Cornfield      Past Surgical History:  Procedure Laterality Date   A/V FISTULAGRAM Left 02/24/2023   Procedure: A/V Fistulagram;  Surgeon: Cephus Shelling, MD;  Location: [facility];  Service: Cardiovascular;  Laterality: Left;   AMPUTATION Left 04/02/2022   Procedure: AMPUTATION LEFT GREAT TOE;  Surgeon: Nadara Mustard,  MD;  Location: [facility] OR;  Service: Orthopedics;  Laterality: Left;   ARTERIOVENOUS GRAFT PLACEMENT Bilateral    "neither work" (10/24/2017)   AV FISTULA PLACEMENT Left 10/26/2018   Procedure: CREATION OF ARTERIOVENOUS FISTULA  LEFT ARM;  Surgeon: Cephus Shelling, MD;  Location: [facility] OR;  Service: Vascular;  Laterality: Left;   AV FISTULA PLACEMENT Left 05/23/2020   Procedure: LEFT ARM ARTERIOVENOUS (AV) FISTULA CREATION;  Surgeon: Cephus Shelling, MD;  Location: [facility] OR;  Service: Vascular;  Laterality: Left;   BALLOON DILATION N/A 01/19/2021   Procedure: Marvis Repress DILATION;  Surgeon: Iva Boop, MD;  Location: [facility];  Service: Endoscopy;  Laterality: N/A;   BALLOON DILATION N/A 02/10/2021   Procedure: BALLOON DILATION;  Surgeon: Hilarie Fredrickson, MD;  Location: [facility];  Service: Endoscopy;  Laterality: N/A;   BALLOON DILATION N/A 02/19/2021   Procedure: BALLOON DILATION;  Surgeon: Imogene Burn, MD;  Location: [facility];  Service: Gastroenterology;  Laterality: N/A;   BASCILIC VEIN TRANSPOSITION Left 12/21/2018   Procedure: Left arm BASILIC VEIN TRANSPOSITION SECOND STAGE;  Surgeon: Cephus Shelling, MD;  Location: [facility] OR;  Service: Vascular;  Laterality: Left;   BIOPSY  12/29/2021   Procedure: BIOPSY;  Surgeon: Meryl Dare, MD;   Location: [facility] ENDOSCOPY;  Service: Gastroenterology;;   CHOLECYSTECTOMY N/A 06/30/2017   Procedure: LAPAROSCOPIC CHOLECYSTECTOMY WITH INTRAOPERATIVE CHOLANGIOGRAM;  Surgeon: Glenna Fellows, MD;  Location: [facility] ORS;  Service: General;  Laterality: N/A;   ESOPHAGOGASTRODUODENOSCOPY (EGD) WITH PROPOFOL N/A 07/04/2017   Procedure: ESOPHAGOGASTRODUODENOSCOPY (EGD) WITH PROPOFOL;  Surgeon: Vida Rigger, MD;  Location: [facility];  Service: Endoscopy;  Laterality: N/A;   ESOPHAGOGASTRODUODENOSCOPY (EGD) WITH PROPOFOL N/A 08/10/2020   Procedure: ESOPHAGOGASTRODUODENOSCOPY (EGD) WITH PROPOFOL;  Surgeon: Sherrilyn Rist, MD;  Location: [facility] ENDOSCOPY;  Service: Gastroenterology;  Laterality: N/A;   ESOPHAGOGASTRODUODENOSCOPY (EGD) WITH PROPOFOL N/A 01/19/2021   Procedure: ESOPHAGOGASTRODUODENOSCOPY (EGD) WITH PROPOFOL;  Surgeon: Iva Boop, MD;  Location: [facility];  Service: Endoscopy;  Laterality: N/A;  WITH FLUOROSCOPY AND DILATION   ESOPHAGOGASTRODUODENOSCOPY (EGD) WITH PROPOFOL N/A 02/03/2021   Procedure: ESOPHAGOGASTRODUODENOSCOPY (EGD) WITH PROPOFOL;  Surgeon: Iva Boop, MD;  Location: [facility];  Service: Endoscopy;  Laterality: N/A;   ESOPHAGOGASTRODUODENOSCOPY (EGD) WITH PROPOFOL N/A 02/10/2021   Procedure: ESOPHAGOGASTRODUODENOSCOPY (EGD) WITH PROPOFOL;  Surgeon: Hilarie Fredrickson, MD;  Location: [facility];  Service: Endoscopy;  Laterality: N/A;  Balloon Dilation   ESOPHAGOGASTRODUODENOSCOPY (EGD) WITH PROPOFOL N/A 02/19/2021   Procedure: ESOPHAGOGASTRODUODENOSCOPY (EGD) WITH PROPOFOL;  Surgeon: Imogene Burn, MD;  Location: [facility];  Service: Gastroenterology;  Laterality: N/A;   ESOPHAGOGASTRODUODENOSCOPY (EGD) WITH PROPOFOL N/A 03/12/2021   Procedure: ESOPHAGOGASTRODUODENOSCOPY (EGD) WITH PROPOFOL;  Surgeon: Shellia Cleverly, DO;  Location: [facility];  Service: Gastroenterology;  Laterality: N/A;   ESOPHAGOGASTRODUODENOSCOPY (EGD) WITH PROPOFOL N/A 12/29/2021    Procedure: ESOPHAGOGASTRODUODENOSCOPY (EGD) WITH PROPOFOL;  Surgeon: Meryl Dare, MD;  Location: [facility] ENDOSCOPY;  Service: Gastroenterology;  Laterality: N/A;   FLEXIBLE SIGMOIDOSCOPY N/A 12/29/2021   Procedure: FLEXIBLE SIGMOIDOSCOPY;  Surgeon: Meryl Dare, MD;  Location: [facility] ENDOSCOPY;  Service: Gastroenterology;  Laterality: N/A;   FOREIGN BODY REMOVAL N/A 02/03/2021   Procedure: FOREIGN BODY REMOVAL;  Surgeon: Iva Boop, MD;  Location: [facility];  Service: Endoscopy;  Laterality: N/A;   INSERTION OF DIALYSIS CATHETER N/A 03/20/2018   Procedure: INSERTION OF TUNNELED DIALYSIS CATHETER - RIGHT INTERANL JUGULAR PLACEMENT;  Surgeon: Chuck Hint, MD;  Location: [facility] OR;  Service: Vascular;  Laterality: N/A;   IR FLUORO GUIDE CV LINE RIGHT  04/18/2020   IR GUIDED DRAIN W CATHETER PLACEMENT  10/28/2018   KIDNEY TRANSPLANT  2007   KIDNEY TRANSPLANT Right    PARATHYROIDECTOMY  ?2012   "3/4 removed" (10/24/2017)   RENAL BIOPSY Bilateral 2003   REVISON OF ARTERIOVENOUS FISTULA Left 09/24/2020   Procedure: LEFT UPPER ARM ARTERIOVENOUS GRAFT CREATION;  Surgeon: Cephus Shelling, MD;  Location: [facility] OR;  Service: Vascular;  Laterality: Left;   UPPER EXTREMITY VENOGRAPHY Bilateral 10/19/2018   Procedure: UPPER EXTREMITY VENOGRAPHY;  Surgeon: Cephus Shelling, MD;  Location: [facility];  Service: Cardiovascular;  Laterality: Bilateral;  Bilateral    UPPER EXTREMITY VENOGRAPHY Left 09/04/2020   Procedure: UPPER EXTREMITY VENOGRAPHY - Left Upper;  Surgeon: Cephus Shelling, MD;  Location: [facility];  Service: Cardiovascular;  Laterality: Left;    Family History  Problem Relation Age of Onset   Arthritis Mother    Hypertension Mother    Aneurysm Mother        died of brain aneurysm   CAD Father        Has 3 stents   Diabetes Father        borderline   Early death Brother        Died in war   Colon cancer Neg Hx    Esophageal cancer Neg Hx    Rectal  cancer Neg Hx    Stomach cancer Neg Hx     Prior to Admission medications   Medication Sig Start Date End Date Taking? Authorizing Provider  calcitRIOL (ROCALTROL) 0.5 MCG capsule Take 0.5 mcg by mouth daily. 05/16/23  Yes [provider]  Calcium Carbonate Antacid (CALCIUM CARBONATE, DOSED IN MG ELEMENTAL CALCIUM,) 1250 MG/5ML SUSP Take 500 mg of elemental calcium by mouth 3 (three) times daily. 05/15/23  Yes [provider]  carvedilol (COREG) 12.5 MG tablet Take 12.5 mg by mouth 2 (two) times daily. 05/16/23  Yes [provider]  conjugated estrogens (PREMARIN) vaginal cream Place 1 applicator vaginally daily as needed (for dryness). 05/23/23 05/22/24 Yes [provider]  ELIQUIS 5 MG TABS tablet Take 5 mg by mouth 2 (two) times daily. 05/16/23  Yes [provider]  famotidine (PEPCID) 20 MG tablet Take 1 tablet (20 mg total) by mouth 2 (two) times daily. 06/10/23  Yes Barnetta Chapel, MD  fidaxomicin (DIFICID) 200 MG TABS tablet Take 1 tablet (200 mg total) by mouth 2 (two) times daily for 8 days. 06/10/23 06/18/23 Yes Berton Mount I, MD  gabapentin (NEURONTIN) 300 MG capsule Take 300 mg by mouth at bedtime.   Yes [provider]  hydrOXYzine (ATARAX) 25 MG tablet Take 25 mg by mouth daily as needed for itching. 05/16/23  Yes [provider]  Insulin Aspart FlexPen (NOVOLOG) 100 UNIT/ML inject 4-10 units  under the skin with meals as directed.  Max 30 units per day 05/19/23  Yes [provider]  insulin degludec (TRESIBA) 100 UNIT/ML FlexTouch Pen Inject 30 Units into the skin daily. 05/19/23 05/13/24 Yes [provider]  mycophenolate (CELLCEPT) 250 MG capsule Take 1 capsule (250 mg total) by mouth 2 (two) times daily. 06/10/23  Yes Berton Mount I, MD  NIFEdipine (PROCARDIA-XL/NIFEDICAL-XL) 30 MG 24 hr tablet Take 30 mg by mouth daily. 05/19/23 05/18/24 Yes [provider]  oxyCODONE (OXY IR/ROXICODONE) 5 MG  immediate release tablet Take 1 tablet (5 mg total) by mouth every 6 (six)  hours as needed for moderate pain (pain score 4-6) or breakthrough pain. 06/10/23  Yes Berton Mount I, MD  pravastatin (PRAVACHOL) 40 MG tablet Take 40 mg by mouth daily. 05/16/23  Yes [provider]  predniSONE (DELTASONE) 5 MG tablet Take 5 mg by mouth daily with breakfast.   Yes [provider]  sucralfate (CARAFATE) 1 g tablet TAKE 1 TABLET BY MOUTH FOUR TIMES DAILY BEFORE MEALS AND NIGHTY. DISSOLVE IN TABLESPOON OF LIQUID OR APPLEASAUCE 05/31/23  Yes [provider]  sulfamethoxazole-trimethoprim (BACTRIM) 400-80 MG tablet Take 1 tablet by mouth 3 (three) times a week. Monday, Wednesday and Fridays 05/16/23  Yes [provider]  tacrolimus (PROGRAF) 5 MG capsule Take 1 capsule (5 mg total) by mouth 2 (two) times daily. 06/10/23  Yes Barnetta Chapel, MD  valGANciclovir (VALCYTE) 450 MG tablet Take 1 tablet (450 mg total) by mouth daily. 06/11/23  Yes Barnetta Chapel, MD    Current Facility-Administered Medications  Medication Dose Route Frequency Provider Last Rate Last Admin   acetaminophen (TYLENOL) tablet 650 mg  650 mg Oral Q6H PRN Arrien, York Ram, MD   650 mg at 06/11/23 1705   Or   acetaminophen (TYLENOL) suppository 650 mg  650 mg Rectal Q6H PRN Arrien, York Ram, MD       apixaban Everlene Balls) tablet 5 mg  5 mg Oral BID Arrien, York Ram, MD   5 mg at 06/13/23 1015   calcitRIOL (ROCALTROL) capsule 0.5 mcg  0.5 mcg Oral Daily Arrien, York Ram, MD   0.5 mcg at 06/13/23 1014   calcium carbonate (dosed in mg elemental calcium) suspension 500 mg of elemental calcium  500 mg of elemental calcium Oral TID Arrien, York Ram, MD   500 mg of elemental calcium at 06/13/23 1016   carvedilol (COREG) tablet 12.5 mg  12.5 mg Oral BID Arrien, York Ram, MD   12.5 mg at 06/13/23 1015   famotidine (PEPCID) tablet 20 mg  20 mg Oral BID [name],  MD   20 mg at 06/13/23 1014   fidaxomicin (DIFICID) tablet 200 mg  200 mg Oral BID Coralie Keens, MD   200 mg at 06/13/23 1015   gabapentin (NEURONTIN) capsule 300 mg  300 mg Oral QHS Arrien, York Ram, MD   300 mg at 06/12/23 2218   hydrALAZINE (APRESOLINE) injection 10 mg  10 mg Intravenous Q6H PRN Sundil, Subrina, MD       hydrALAZINE (APRESOLINE) injection 10 mg  10 mg Intravenous Once Sundil, Subrina, MD       hydrOXYzine (ATARAX) tablet 25 mg  25 mg Oral Daily PRN Arrien, York Ram, MD       magnesium sulfate IVPB 4 g 100 mL  4 g Intravenous Once [name], MD 50 mL/hr at 06/13/23 1030 4 g at 06/13/23 1030   mycophenolate (CELLCEPT) capsule 250 mg  250 mg Oral BID Coralie Keens, MD   250 mg at 06/13/23 1014   NIFEdipine (PROCARDIA-XL/NIFEDICAL-XL) 24 hr tablet 30 mg  30 mg Oral Daily Arrien, York Ram, MD   30 mg at 06/13/23 1014   Oral care mouth rinse  15 mL Mouth Rinse PRN Arrien, York Ram, MD       oxyCODONE (Oxy IR/ROXICODONE) immediate release tablet 5 mg  5 mg Oral Q6H PRN Arrien, York Ram, MD       pravastatin (PRAVACHOL) tablet 40 mg  40 mg Oral Daily Arrien, York Ram, MD   [phone]  mg at 06/13/23 1014   predniSONE (DELTASONE) tablet 5 mg  5 mg Oral Q breakfast Arrien, York Ram, MD   5 mg at 06/13/23 1015   prochlorperazine (COMPAZINE) injection 10 mg  10 mg Intravenous Q6H PRN Sundil, Subrina, MD   10 mg at 06/13/23 1037   sucralfate (CARAFATE) tablet 1 g  1 g Oral TID WC & HS Arrien, York Ram, MD   1 g at 06/13/23 1015   tacrolimus (PROGRAF) capsule 5 mg  5 mg Oral BID Coralie Keens, MD   5 mg at 06/13/23 1013   valGANciclovir (VALCYTE) 450 MG tablet TABS 450 mg  450 mg Oral Daily Arrien, York Ram, MD   450 mg at 06/13/23 1016    Allergies as of 06/11/2023 - Review Complete 06/11/2023  Allergen Reaction Noted   Diphenhydramine Anaphylaxis 12/27/2019   Peanut-containing drug products  Itching and Hives 07/21/2021   Contrast media [iodinated contrast media] Itching 09/04/2020   Shellfish allergy Hives 12/22/2012   Chlorhexidine Itching 06/15/2019   Ferrous sulfate Itching 07/19/2018   Iron dextran Itching and Other (See Comments) 02/24/2010    Social History   Socioeconomic History   Marital status: Single    Spouse name: Not on file   Number of children: Not on file   Years of education: Not on file   Highest education level: Not on file  Occupational History   Occupation: Conservation officer, 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  Other Topics Concern   Not on file  Social History Narrative   Single\   Dialysis [facility]   Never smoker no current alcohol no drug use      Right Handed   Drinks caffeine rarely   Social Drivers of Corporate investment banker Strain: High Risk (05/11/2023)   Received from [facility]   Overall Financial Resource Strain (CARDIA)    Difficulty of Paying Living Expenses: Hard  Food Insecurity: Food Insecurity Present (06/11/2023)   Hunger Vital Sign    Worried About Running Out of Food in the Last Year: Sometimes true    Ran Out of Food in the Last Year: Often true  Transportation Needs: No Transportation Needs (06/11/2023)   PRAPARE - Administrator, Civil Service (Medical): No    Lack of Transportation (Non-Medical): No  Recent Concern: Transportation Needs - Unmet Transportation Needs (05/23/2023)   Received [facility]    Lack of Transportation (Medical): Yes    Lack of Transportation (Non-Medical): Yes  Physical Activity: Sufficiently Active (10/24/2017)   Exercise Vital  Sign    Days of Exercise per Week: 7 days    Minutes of Exercise per Session: 30 min  Stress: No Stress Concern Present (10/24/2017)   [employer] of Occupational Health - Occupational Stress Questionnaire    Feeling of Stress : Not at all  Social Connections: Moderately Isolated (06/11/2023)   Social Connection and Isolation Panel [NHANES]    Frequency of Communication with Friends and Family: Patient declined    Frequency of Social Gatherings with Friends and Family: More than  three times a week    Attends Religious Services: More than 4 times per year    Active Member of Clubs or Organizations: No    Attends Banker Meetings: Never    Marital Status: Never married  Intimate Partner Violence: Not At Risk (06/11/2023)   Humiliation, Afraid, Rape, and Kick questionnaire    Fear of Current or Ex-Partner: No    Emotionally Abused: No    Physically Abused: No    Sexually Abused: No     Code Status   Code Status: Full Code  Review of Systems: All systems reviewed and negative except where noted in HPI.  Physical Exam: Vital signs in last 24 hours: Temp:  [98 F (36.7 C)-98.9 F (37.2 C)] 98 F (36.7 C) (03/31 0427) Pulse Rate:  [99] 99 (03/30 1400) Resp:  [20] 20 (03/30 1400) BP: (123-196)/(81-139) 150/93 (03/31 0800) SpO2:  [100 %] 100 % (03/30 1400) Last BM Date : 06/12/23  General:  Pleasant female in NAD Psych:  Cooperative. Normal mood and affect Eyes: Pupils equal Ears:  Normal auditory acuity Nose: No deformity, discharge or lesions Neck:  Supple, no masses felt Lungs:  Clear to auscultation.  Heart:  Regular rate, regular rhythm.  Abdomen:  Soft, nondistended, nontender, active bowel sounds, no masses felt Rectal :  Deferred Msk: Symmetrical without gross deformities.  Neurologic:  Alert, oriented, grossly normal neurologically Extremities : No edema Skin:  Intact without significant lesions.    Intake/Output from previous day: No  intake/output data recorded. Intake/Output this shift:  No intake/output data recorded.   Willette Cluster, NP-C   06/13/2023, 11:52 AM

## 2023-06-13 NOTE — Progress Notes (Addendum)
 PROGRESS NOTE                                                                                                                                                                                                             Patient Demographics:    [name], is a 38 y.o. female, DOB - 28-Jun-1985, ZOX:[id number]  Outpatient Primary MD for the patient is Fleet Contras, MD    LOS - 1  Admit date - 06/11/2023    Chief Complaint  Patient presents with   Abdominal Pain       Brief Narrative (HPI from H&P)    38 y.o. female with medical history significant for left kidney transplant in December 2024 on immunosuppressants, followed by [facility], history of DVT on Eliquis, recent pyelonephritis involving transplant kidney, completed course of antibiotics who initially presented at [facility] ED with complaints of intractable nausea, vomiting, abdominal pain, and diarrhea. CT a/p on 06/06/2023 showing no acute localizing process in the abdomen or pelvis.  Stool test returned positive for both C. difficile and Norovirus. Due to the intractable nausea and vomiting she was unable to keep her immunosuppressants down for the past 2 days. In the ER, tachycardic and tachypneic.  She received IV fluid bolus 1 L NS, IV antiemetics, IV opiate based analgesics and her home transplant medications, immunosuppressants were restarted.  Admitted by [facility], hospitalist service, for intractable nausea and vomiting and treatment for C. difficile diarrhea.    Subjective:   Patient in bed appears to be in no distress denies any headache or chest pain, some intermittent epigastric abdominal pain along with nausea, says was unable to keep her food down yesterday, no focal deficits, no blood in stool or urine.   Assessment  & Plan :    GERD with history of esophagitis  Patient with severe reflux symptoms. Her abdominal radiograph has no signs of bowel obstruction.  Was  given few doses of IV PPI and Carafate, symptoms only marginally improved, since she has recent C. difficile infection we will discontinue PPI and place her on Pepcid, continue Carafate, abdominal exam remains benign, KUB unremarkable, encouraged to cut down narcotic use, supportive care with antiemetics, she still says she is not feeling much better than what she came in with, will involve GI question if she requires EGD, question gastroparesis.  Patient is exhibiting  narcotic seeking behavior, she is obsessed with getting IV narcotics, when I went in this morning to her room she was calm and quiet and resting comfortably, later when the nurse came to attend to the patient she was crying loudly and requesting IV pain medications, says does not want oral medications.  When I had the nurse come out of the room to talk to me patient stopped crying, once I entered the room again she resumed her crying back again.  Abdominal exam benign, KUB benign, will continue to monitor.    Note patient went home few days ago with some oral narcotics, says she came back because she ran out of her pills and wanted more pain relief.    C. difficile colitis No further diarrhea, plan to continue fidazomicin as planned.  Afebrile, no leukocytosis normal CRP.  Diarrhea much improved clinically this problem has resolved.  CKD stage 3b, GFR 30-44 ml/min (HCC) - Renal function with stable serum cr, gently hydrate, advance oral diet.  Renal transplant recipient, plan to continue with tacrolimus, prednisone, and mycophenolate (reduced dose to 250 mg bid while on C diff treatment with fidaxomacin, when completed she will resume 750 mg bid dosing).    Metabolic bone disease, continue with calcitriol.   Essential hypertension, benign Continue blood pressure control with nifedipine and carvedilol.   Hyperlipidemia  Continue with statin therapy   Hypomagnesemia.  Replaced.    Seizures (HCC)  No active seizures.   Obesity:  Estimated body mass index is 29.38 kg/m, follow-up with PCP.  History of DVT on Eliquis  AOCD.  Stable.    Uncontrolled type 1 diabetes mellitus with hyperglycemia (HCC) currently on long-acting insulin along with sliding scale monitor and adjust.    Lab Results  Component Value Date   HGBA1C 12.1 (H) 06/09/2023    CBG (last 3)  Recent Labs    06/10/23 1220  GLUCAP 272*            Condition - Fair  Family Communication  :  None  Code Status :   Full  Consults  :   [facility]  PUD Prophylaxis :     Procedures  :            Disposition Plan  :    Status is: Observation  DVT Prophylaxis  :    SCDs Start: 06/11/23 1322 apixaban (ELIQUIS) tablet 5 mg     Lab Results  Component Value Date   PLT 169 06/13/2023    Diet :  Diet Order             DIET SOFT Fluid consistency: Thin  Diet effective now                    Inpatient Medications  Scheduled Meds:  apixaban  5 mg Oral BID   calcitRIOL  0.5 mcg Oral Daily   calcium carbonate (dosed in mg elemental calcium)  500 mg of elemental calcium Oral TID   carvedilol  12.5 mg Oral BID   famotidine  20 mg Oral BID   fidaxomicin  200 mg Oral BID   gabapentin  300 mg Oral QHS   hydrALAZINE  10 mg Intravenous Once   mycophenolate  250 mg Oral BID   NIFEdipine  30 mg Oral Daily   pravastatin  40 mg Oral Daily   predniSONE  5 mg Oral Q breakfast   sucralfate  1 g Oral TID WC & HS   tacrolimus  5 mg Oral BID   valGANciclovir  450 mg Oral Daily   Continuous Infusions:  magnesium sulfate bolus IVPB     PRN Meds:.acetaminophen **OR** acetaminophen, hydrALAZINE, hydrOXYzine, mouth rinse, oxyCODONE, prochlorperazine  Antibiotics  :    Anti-infectives (From admission, onward)    Start     Dose/Rate Route Frequency Ordered Stop   06/11/23 1415  fidaxomicin (DIFICID) tablet 200 mg        200 mg Oral 2 times daily 06/11/23 1322     06/11/23 1415  valGANciclovir (VALCYTE) 450 MG tablet TABS 450  mg        450 mg Oral Daily 06/11/23 1322           Objective:   Vitals:   06/13/23 0105 06/13/23 0427 06/13/23 0737 06/13/23 0800  BP: 129/85 123/81 (!) 146/105 (!) 150/93  Pulse:      Resp:      Temp:  98 F (36.7 C)    TempSrc:  Oral    SpO2:      Weight:      Height:        Wt Readings from Last 3 Encounters:  06/11/23 82.6 kg  06/07/23 86.2 kg  05/07/23 81.6 kg    No intake or output data in the 24 hours ending 06/13/23 1002    Physical Exam  Awake Alert, No new F.N deficits, Normal affect [place].AT,PERRAL Supple Neck, No JVD,   Symmetrical Chest wall movement, Good air movement bilaterally, CTAB RRR,No Gallops,Rubs or new Murmurs,  +ve B.Sounds, Abd Soft, No tenderness,   No Cyanosis, Clubbing or edema       Data Review:    Recent Labs  Lab 06/06/23 1748 06/07/23 1608 06/09/23 0352 06/10/23 1610 06/11/23 0815 06/12/23 0546 06/13/23 0457  WBC 5.1   < > 3.4* 2.9* 5.9 3.7* 4.6  HGB 10.8*   < > 8.4* 8.2* 10.6* 8.9* 9.6*  HCT 33.9*   < > 26.3* 25.5* 33.4* 28.6* 29.3*  PLT 202   < > 147* 147* 193 153 169  MCV 89.7   < > 90.1 89.5 90.8 90.8 88.3  MCH 28.6   < > 28.8 28.8 28.8 28.3 28.9  MCHC 31.9   < > 31.9 32.2 31.7 31.1 32.8  RDW 15.5   < > 15.0 15.1 15.1 15.5 15.0  LYMPHSABS 0.5*  --   --  0.4* 0.7  --  0.6*  MONOABS 0.5  --   --  0.3 0.4  --  0.4  EOSABS 0.1  --   --  0.1 0.1  --  0.1  BASOSABS 0.1  --   --  0.1 0.1  --  0.1   < > = values in this interval not displayed.    Recent Labs  Lab 06/06/23 1748 06/07/23 1608 06/07/23 1759 06/08/23 0409 06/09/23 0352 06/09/23 1516 06/10/23 9604 06/11/23 0815 06/12/23 0545 06/12/23 0546 06/13/23 0457  NA 134* 134*  --  137 131* 136 136 136  --  135 135  K 5.0 4.5  --  4.2 5.7* 4.9 4.2 4.4  --  4.4 4.2  CL 102 104  --  105 103 104 103 102  --  104 104  CO2 24 20*  --  23 23 24 24  21*  --  23 24  ANIONGAP 8 10  --  9 5 8 9 13   --  8 7  GLUCOSE 316* 323*  --  209* 457* 106* 208* 246*  --  161* 212*  BUN 27* 22*  --  21* 23* 25* 23* 30*  --  22* 19  CREATININE 1.54* 1.50*  --  1.48* 1.59* 1.59* 1.41* 1.60*  --  1.34* 1.37*  AST 12* 16  --   --   --   --   --  29  --   --   --   ALT 12 14  --   --   --   --   --  21  --   --   --   ALKPHOS 78 67  --   --   --   --   --  58  --   --   --   BILITOT 0.5 0.8  --   --   --   --   --  0.5  --   --   --   ALBUMIN 4.5 4.0  --   --  3.2*  --   --  4.2  --   --   --   CRP  --   --   --   --   --   --   --   --  0.9  --  1.1*  PROCALCITON  --   --   --   --   --   --   --   --  <0.10  --  <0.10  LATICACIDVEN  --   --  1.0  --   --   --   --   --   --   --   --   HGBA1C  --   --   --   --  12.1*  --   --   --   --   --   --   BNP  --   --   --   --   --   --   --   --   --   --  18.9  MG  --   --   --  1.5*  --   --   --  1.7 1.5*  --  1.6*  PHOS  --   --   --  3.3 3.2  --   --   --   --   --  3.6  CALCIUM 8.9 8.6*  --  8.2* 7.8* 8.5* 7.8* 8.8*  --  7.6* 7.8*      Recent Labs  Lab 06/07/23 1759 06/08/23 0409 06/09/23 0352 06/09/23 1516 06/10/23 1610 06/11/23 0815 06/12/23 0545 06/12/23 0546 06/13/23 0457  CRP  --   --   --   --   --   --  0.9  --  1.1*  PROCALCITON  --   --   --   --   --   --  <0.10  --  <0.10  LATICACIDVEN 1.0  --   --   --   --   --   --   --   --   HGBA1C  --   --  12.1*  --   --   --   --   --   --   BNP  --   --   --   --   --   --   --   --  18.9  MG  --  1.5*  --   --   --  1.7 1.5*  --  1.6*  CALCIUM  --  8.2* 7.8* 8.5*  7.8* 8.8*  --  7.6* 7.8*    --------------------------------------------------------------------------------------------------------------- Lab Results  Component Value Date   CHOL 162 03/23/2022   HDL 28 (L) 03/23/2022   LDLCALC  03/23/2022     Comment:     . LDL cholesterol not calculated. Triglyceride levels greater than 400 mg/dL invalidate calculated LDL results. . Reference range: <100 . Desirable range <100 mg/dL for primary prevention;   <70 mg/dL for patients  with CHD or diabetic patients  with > or = 2 CHD risk factors. Marland Kitchen LDL-C is now calculated using the Martin-Hopkins  calculation, which is a validated novel method providing  better accuracy than the Friedewald equation in the  estimation of LDL-C.  Horald Pollen et al. Lenox Ahr. 1610;960(45): 2061-2068  (http://education.[employer].com/faq/FAQ164)    LDLDIRECT 65 01/20/2012   TRIG 579 (H) 03/23/2022   CHOLHDL 5.8 (H) 03/23/2022    Lab Results  Component Value Date   HGBA1C 12.1 (H) 06/09/2023   Radiology Report DG Abd Portable 1V Result Date: 06/13/2023 CLINICAL DATA:  [id number] Nausea AND vomiting [id number] EXAM: PORTABLE ABDOMEN - 1 VIEW COMPARISON:  KUB, 06/11/2023 CT AP, 06/06/2023. FINDINGS: The bowel gas pattern is normal. Cholecystectomy clips, and additional surgical clips within the pelvis. No radio-opaque calculi or other significant radiographic abnormality are seen. IMPRESSION: Nonobstructed bowel-gas pattern. Electronically Signed   By: Roanna Banning M.D.   On: 06/13/2023 07:04   DG Abd 1 View Result Date: 06/11/2023 CLINICAL DATA:  [id number] Pain [id number] EXAM: ABDOMEN - 1 VIEW COMPARISON:  06/02/2021. FINDINGS: The bowel gas pattern is non-obstructive. Small to moderate amount of stool burden throughout the colon. No evidence of pneumoperitoneum, within the limitations of a supine film. No acute osseous abnormalities. The soft tissues are within normal limits. Surgical changes, devices, tubes and lines: There are surgical clips in the right upper quadrant, typical of a previous cholecystectomy. IMPRESSION: *Nonobstructive bowel gas pattern. Electronically Signed   By: Jules Schick M.D.   On: 06/11/2023 11:36     Signature  -   Susa Raring M.D on 06/13/2023 at 10:02 AM   -  To page go to [email or web address]

## 2023-06-13 NOTE — Plan of Care (Signed)

## 2023-06-13 NOTE — Inpatient Diabetes Management (Signed)
 Inpatient Diabetes Program Recommendations  AACE/ADA: New Consensus Statement on Inpatient Glycemic Control (2015)  Target Ranges:  Prepandial:   less than 140 mg/dL      Peak postprandial:   less than 180 mg/dL (1-2 hours)      Critically ill patients:  140 - 180 mg/dL   Lab Results  Component Value Date   GLUCAP 272 (H) 06/10/2023   HGBA1C 12.1 (H) 06/09/2023    Latest Reference Range & Units 06/13/23 04:57  Sodium 135 - 145 mmol/L 135  Potassium 3.5 - 5.1 mmol/L 4.2  Chloride 98 - 111 mmol/L 104  CO2 22 - 32 mmol/L 24  Glucose 70 - 99 mg/dL 161 (H)  BUN 6 - 20 mg/dL 19  Creatinine 0.96 - 0.45 mg/dL 4.09 (H)  Calcium 8.9 - 10.3 mg/dL 7.8 (L)  Anion gap 5 - 15  7  (H): Data is abnormally high (L): Data is abnormally low  Diabetes history: Type 1 DM Outpatient Diabetes medications: Novolog 4-12 units TID, Tresiba 12 units QD Current orders for Inpatient glycemic control: Prednisone 5 mg QD  Inpatient Diabetes Program Recommendations:   Noted patient has type 1 diabetes and was just discharged home on 06/10/23. DM coordinator spoke with patient on 06/09/23 regarding A1c of 12.1 and diabetes management. Please consider: -Glycemic control order set with 0-6 units tid, 0-5 units hs -Add basal insulin 15 units daily -Add Novolog 2 units tid meal coverage if eats 50% meals  Thank you, Darel Hong E. Envy Meno, RN, MSN, CDCES  Diabetes Coordinator Inpatient Glycemic Control Team Team Pager [phone] (8am-5pm) 06/13/2023 10:23 AM

## 2023-06-14 ENCOUNTER — Other Ambulatory Visit (HOSPITAL_COMMUNITY): Payer: Self-pay

## 2023-06-14 DIAGNOSIS — K2100 Gastro-esophageal reflux disease with esophagitis, without bleeding: Secondary | ICD-10-CM | POA: Diagnosis not present

## 2023-06-14 DIAGNOSIS — K219 Gastro-esophageal reflux disease without esophagitis: Secondary | ICD-10-CM | POA: Diagnosis not present

## 2023-06-14 DIAGNOSIS — Z8619 Personal history of other infectious and parasitic diseases: Secondary | ICD-10-CM | POA: Diagnosis not present

## 2023-06-14 DIAGNOSIS — R112 Nausea with vomiting, unspecified: Secondary | ICD-10-CM | POA: Diagnosis not present

## 2023-06-14 DIAGNOSIS — Z8719 Personal history of other diseases of the digestive system: Secondary | ICD-10-CM | POA: Diagnosis not present

## 2023-06-14 LAB — CBC WITH DIFFERENTIAL/PLATELET
Abs Immature Granulocytes: 0.02 10*3/uL (ref 0.00–0.07)
Basophils Absolute: 0.1 10*3/uL (ref 0.0–0.1)
Basophils Relative: 2 %
Eosinophils Absolute: 0.1 10*3/uL (ref 0.0–0.5)
Eosinophils Relative: 2 %
HCT: 27.3 % — ABNORMAL LOW (ref 36.0–46.0)
Hemoglobin: 8.7 g/dL — ABNORMAL LOW (ref 12.0–15.0)
Immature Granulocytes: 1 %
Lymphocytes Relative: 13 %
Lymphs Abs: 0.5 10*3/uL — ABNORMAL LOW (ref 0.7–4.0)
MCH: 28.5 pg (ref 26.0–34.0)
MCHC: 31.9 g/dL (ref 30.0–36.0)
MCV: 89.5 fL (ref 80.0–100.0)
Monocytes Absolute: 0.5 10*3/uL (ref 0.1–1.0)
Monocytes Relative: 11 %
Neutro Abs: 3 10*3/uL (ref 1.7–7.7)
Neutrophils Relative %: 71 %
Platelets: 176 10*3/uL (ref 150–400)
RBC: 3.05 MIL/uL — ABNORMAL LOW (ref 3.87–5.11)
RDW: 14.9 % (ref 11.5–15.5)
WBC: 4.2 10*3/uL (ref 4.0–10.5)
nRBC: 0 % (ref 0.0–0.2)

## 2023-06-14 LAB — BASIC METABOLIC PANEL WITH GFR
Anion gap: 7 (ref 5–15)
BUN: 22 mg/dL — ABNORMAL HIGH (ref 6–20)
CO2: 24 mmol/L (ref 22–32)
Calcium: 8.1 mg/dL — ABNORMAL LOW (ref 8.9–10.3)
Chloride: 100 mmol/L (ref 98–111)
Creatinine, Ser: 1.34 mg/dL — ABNORMAL HIGH (ref 0.44–1.00)
GFR, Estimated: 52 mL/min — ABNORMAL LOW (ref >60)
Glucose, Bld: 313 mg/dL — ABNORMAL HIGH (ref 70–99)
Potassium: 4.6 mmol/L (ref 3.5–5.1)
Sodium: 131 mmol/L — ABNORMAL LOW (ref 135–145)

## 2023-06-14 LAB — MAGNESIUM: Magnesium: 2.4 mg/dL (ref 1.7–2.4)

## 2023-06-14 LAB — BRAIN NATRIURETIC PEPTIDE: B Natriuretic Peptide: 8.8 pg/mL (ref 0.0–100.0)

## 2023-06-14 LAB — C-REACTIVE PROTEIN: CRP: 0.9 mg/dL (ref <1.0)

## 2023-06-14 LAB — PROCALCITONIN: Procalcitonin: <0.1 ng/mL

## 2023-06-14 LAB — PHOSPHORUS: Phosphorus: 3.8 mg/dL (ref 2.5–4.6)

## 2023-06-14 MED ORDER — PROMETHAZINE HCL 25 MG PO TABS
12.5000 mg | ORAL_TABLET | Freq: Three times a day (TID) | ORAL | 0 refills | Status: DC | PRN
  Filled 2023-06-14: qty 15, 10d supply, fill #0

## 2023-06-14 MED ORDER — METOCLOPRAMIDE HCL 5 MG PO TABS
5.0000 mg | ORAL_TABLET | Freq: Three times a day (TID) | ORAL | 0 refills | Status: DC | PRN
  Filled 2023-06-14: qty 20, 7d supply, fill #0

## 2023-06-14 MED ORDER — MYCOPHENOLATE MOFETIL 250 MG PO CAPS: 250.0000 mg | ORAL_CAPSULE | Freq: Two times a day (BID) | ORAL | Status: AC

## 2023-06-14 NOTE — Discharge Summary (Signed)
 [name]:[id number] DOB: 09-14-1985 DOA: 06/11/2023  PCP: Fleet Contras, MD  Admit date: 06/11/2023  Discharge date: 06/14/2023  Admitted From: Home   Disposition:  Home   Recommendations for Outpatient Follow-up:   Follow up with PCP in 1-2 weeks  PCP Please obtain BMP/CBC, 2 view CXR in 1week,  (see Discharge instructions)   PCP Please follow up on the following pending results: Arrange for close outpatient follow-up with her gastroenterologist and kidney transplant physician in 7-10 days   Home Health: None Equipment/Devices:  None  Consultations: GI Discharge Condition: Stable    CODE STATUS: Full    Diet Recommendation: Heart Healthy Low Carb    Chief Complaint  Patient presents with   Abdominal Pain     Brief history of present illness from the day of admission and additional interim summary    38 y.o. female with medical history significant for left kidney transplant in December 2024 on immunosuppressants, followed by [facility], history of DVT on Eliquis, recent pyelonephritis involving transplant kidney, completed course of antibiotics who initially presented at [facility] ED with complaints of intractable nausea, vomiting, abdominal pain, and diarrhea. CT a/p on 06/06/2023 showing no acute localizing process in the abdomen or pelvis.  Stool test returned positive for both C. difficile and Norovirus. Due to the intractable nausea and vomiting she was unable to keep her immunosuppressants down for the past 2 days. In the ER, tachycardic and tachypneic.  She received IV fluid bolus 1 L NS, IV antiemetics, IV opiate based analgesics and her home transplant medications, immunosuppressants were restarted.  Admitted by [facility], hospitalist service, for intractable nausea and vomiting and treatment for C. difficile  diarrhea.                                                                    Hospital Course   GERD with history of esophagitis, gastroparesis, possible esophageal stricture in the past.  Patient with severe reflux symptoms. Her abdominal radiograph has no signs of bowel obstruction.  Was given few doses of IV PPI and Carafate, symptoms only marginally improved, since she has recent C. difficile infection we will discontinue PPI and place her on Pepcid, continue Carafate, abdominal exam remains benign, KUB unremarkable, encouraged to cut down narcotic use, supportive care with antiemetics, she was seen by GI, she feels better today, case discussed with GI continue home medications, as needed Reglan given on top of her previous GI regimen.  Plan discussed with GI on 06/14/2023.  Postdischarge follow-up with her gastroenterologist in 1 to 2 weeks.   Patient is exhibiting narcotic seeking behavior, she is obsessed with getting IV narcotics, when I went in this morning to her room she was calm and quiet and resting comfortably, later when the nurse came to attend  to the patient she was crying loudly and requesting IV pain medications, says does not want oral medications.  When I had the nurse come out of the room to talk to me patient stopped crying, once I entered the room again she resumed her crying back again.  Abdominal exam benign, KUB benign.   Note patient went home few days ago with some oral narcotics, says she came back because she ran out of her pills and wanted more pain relief.  He was told that no narcotics will be filled here upon discharge.       C. difficile colitis No further diarrhea, plan to continue fidazomicin as planned.  Afebrile, no leukocytosis normal CRP.  Diarrhea much improved, discharged home course of antibiotic, clinically this problem has resolved.   CKD stage 3b, GFR 30-44 ml/min (HCC) - Renal function with stable serum cr, gently hydrate, advance oral diet.   Renal  transplant recipient, plan to continue with tacrolimus, prednisone, and mycophenolate (reduced dose to 250 mg bid while on C diff treatment with fidaxomacin, when completed she will resume 750 mg bid dosing).     Metabolic bone disease, continue with calcitriol.    Essential hypertension, benign Continue blood pressure control with nifedipine and carvedilol.    Hyperlipidemia  Continue with statin therapy    Hypomagnesemia.  Replaced.     Seizures (HCC)  No active seizures.    Obesity: Estimated body mass index is 29.38 kg/m, follow-up with PCP.   History of DVT on Eliquis   AOCD.  Stable.     Uncontrolled type 1 diabetes mellitus with hyperglycemia (HCC) poorly controlled, continue outpatient regimen, counseled on compliance, PCP to monitor and adjust    Lab Results  Component Value Date   HGBA1C 12.1 (H) 06/09/2023   CBG (last 3)  No results for input(s): "GLUCAP" in the last 72 hours.  Discharge diagnosis     Principal Problem:   GERD with esophagitis Active Problems:   Nausea   C. difficile colitis   CKD stage 3b, GFR 30-44 ml/min (HCC)   Essential hypertension, benign   Uncontrolled type 1 diabetes mellitus with hyperglycemia (HCC)   Hyperlipidemia   Seizures (HCC)   Pain of upper abdomen   Gastroparesis   History of Clostridioides difficile colitis    Discharge instructions    Discharge Instructions     Discharge instructions   Complete by: As directed    Follow with Primary MD Fleet Contras, MD in 7 days, follow-up with your gastroenterologist and transplant physician within a week of discharge.  Get CBC, CMP, 2 view Chest X ray -  checked next visit with your primary MD    Activity: As tolerated with Full fall precautions use walker/cane & assistance as needed  Disposition Home   Diet: Heart Healthy Low Carb diet, check CBGs q. [facility]  Special Instructions: If you have smoked or chewed Tobacco  in the last 2 yrs please stop smoking, stop any  regular Alcohol  and or any Recreational drug use.  On your next visit with your primary care physician please Get Medicines reviewed and adjusted.  Please request your Prim.MD to go over all Hospital Tests and Procedure/Radiological results at the follow up, please get all Hospital records sent to your Prim MD by signing hospital release before you go home.  If you experience worsening of your admission symptoms, develop shortness of breath, life threatening emergency, suicidal or homicidal thoughts you must seek medical attention immediately by  calling 911 or calling your MD immediately  if symptoms less severe.  You Must read complete instructions/literature along with all the possible adverse reactions/side effects for all the Medicines you take and that have been prescribed to you. Take any new Medicines after you have completely understood and accpet all the possible adverse reactions/side effects.   Do not drive when taking Pain medications.  Do not take more than prescribed Pain, Sleep and Anxiety Medications  Wear Seat belts while driving.   Increase activity slowly   Complete by: As directed        Discharge Medications   Allergies as of 06/14/2023       Reactions   Diphenhydramine Anaphylaxis   Peanut-containing Drug Products Itching, Hives   Able to tolerate when cooked in foods   Contrast Media [iodinated Contrast Media] Itching   Shellfish Allergy Hives   Chlorhexidine Itching   Ferrous Sulfate Itching   Iron Dextran Itching, Other (See Comments)   Vein irritation        Medication List     TAKE these medications    calcitRIOL 0.5 MCG capsule Commonly known as: ROCALTROL Take 0.5 mcg by mouth daily.   calcium carbonate (dosed in mg elemental calcium) 1250 MG/5ML Susp Take 500 mg of elemental calcium by mouth 3 (three) times daily.   carvedilol 12.5 MG tablet Commonly known as: COREG Take 12.5 mg by mouth 2 (two) times daily.   conjugated estrogens  vaginal cream Commonly known as: PREMARIN Place 1 applicator vaginally daily as needed (for dryness).   Eliquis 5 MG Tabs tablet Generic drug: apixaban Take 5 mg by mouth 2 (two) times daily.   famotidine 20 MG tablet Commonly known as: PEPCID Take 1 tablet (20 mg total) by mouth 2 (two) times daily.   fidaxomicin 200 MG Tabs tablet Commonly known as: DIFICID Take 1 tablet (200 mg total) by mouth 2 (two) times daily for 8 days.   gabapentin 300 MG capsule Commonly known as: NEURONTIN Take 300 mg by mouth at bedtime.   hydrOXYzine 25 MG tablet Commonly known as: ATARAX Take 25 mg by mouth daily as needed for itching.   Insulin Aspart FlexPen 100 UNIT/ML Commonly known as: NOVOLOG inject 4-10 units  under the skin with meals as directed.  Max 30 units per day   insulin degludec 100 UNIT/ML FlexTouch Pen Commonly known as: TRESIBA Inject 30 Units into the skin daily.   metoCLOPramide 5 MG tablet Commonly known as: Reglan Take 1 tablet (5 mg total) by mouth every 8 (eight) hours as needed for nausea.   mycophenolate 250 MG capsule Commonly known as: CELLCEPT Take 1 capsule (250 mg total) by mouth 2 (two) times daily. Please resume your home dose of 750 twice daily dose once you have finished your deficit treatment on 06/18/2023. What changed: additional instructions   NIFEdipine 30 MG 24 hr tablet Commonly known as: PROCARDIA-XL/NIFEDICAL-XL Take 30 mg by mouth daily.   oxyCODONE 5 MG immediate release tablet Commonly known as: Oxy IR/ROXICODONE Take 1 tablet (5 mg total) by mouth every 6 (six) hours as needed for moderate pain (pain score 4-6) or breakthrough pain.   pravastatin 40 MG tablet Commonly known as: PRAVACHOL Take 40 mg by mouth daily.   predniSONE 5 MG tablet Commonly known as: DELTASONE Take 5 mg by mouth daily with breakfast.   promethazine 25 MG tablet Commonly known as: PHENERGAN Take 0.5 tablets (12.5 mg total) by mouth every 8 (eight) hours as  needed for nausea or vomiting.   sucralfate 1 g tablet Commonly known as: CARAFATE TAKE 1 TABLET BY MOUTH FOUR TIMES DAILY BEFORE MEALS AND NIGHTY. DISSOLVE IN TABLESPOON OF LIQUID OR APPLEASAUCE   sulfamethoxazole-trimethoprim 400-80 MG tablet Commonly known as: BACTRIM Take 1 tablet by mouth 3 (three) times a week. Monday, Wednesday and Fridays   tacrolimus 5 MG capsule Commonly known as: PROGRAF Take 1 capsule (5 mg total) by mouth 2 (two) times daily.   valGANciclovir 450 MG tablet Commonly known as: VALCYTE Take 1 tablet (450 mg total) by mouth daily.         Follow-up Information     Fleet Contras, MD. Schedule an appointment as soon as possible for a visit in 1 week(s).   Specialty: Internal Medicine Why: Follow-up with your kidney transplant and gastroenterologist physician within a week of discharge. Contact information: [address] [place] [address] [phone]                 Major procedures and Radiology Reports - PLEASE review detailed and final reports thoroughly  -       DG Abd Portable 1V Result Date: 06/13/2023 CLINICAL DATA:  [id number] Nausea AND vomiting [id number] EXAM: PORTABLE ABDOMEN - 1 VIEW COMPARISON:  KUB, 06/11/2023 CT AP, 06/06/2023. FINDINGS: The bowel gas pattern is normal. Cholecystectomy clips, and additional surgical clips within the pelvis. No radio-opaque calculi or other significant radiographic abnormality are seen. IMPRESSION: Nonobstructed bowel-gas pattern. Electronically Signed   By: Roanna Banning M.D.   On: 06/13/2023 07:04   DG Abd 1 View Result Date: 06/11/2023 CLINICAL DATA:  [id number] Pain [id number] EXAM: ABDOMEN - 1 VIEW COMPARISON:  06/02/2021. FINDINGS: The bowel gas pattern is non-obstructive. Small to moderate amount of stool burden throughout the colon. No evidence of pneumoperitoneum, within the limitations of a supine film. No acute osseous abnormalities. The soft tissues are within normal limits. Surgical changes,  devices, tubes and lines: There are surgical clips in the right upper quadrant, typical of a previous cholecystectomy. IMPRESSION: *Nonobstructive bowel gas pattern. Electronically Signed   By: Jules Schick M.D.   On: 06/11/2023 11:36   CT ABDOMEN PELVIS WO CONTRAST Result Date: 06/06/2023 CLINICAL DATA:  Abdominal pain with nausea and vomiting. Renal transplant. EXAM: CT ABDOMEN AND PELVIS WITHOUT CONTRAST TECHNIQUE: Multidetector CT imaging of the abdomen and pelvis was performed following the standard protocol without IV contrast. RADIATION DOSE REDUCTION: This exam was performed according to the departmental dose-optimization program which includes automated exposure control, adjustment of the mA and/or kV according to patient size and/or use of iterative reconstruction technique. COMPARISON:  CT abdomen and pelvis 05/08/2023 FINDINGS: Lower chest: No acute abnormality. Hepatobiliary: No focal liver abnormality is seen. Status post cholecystectomy. No biliary dilatation. Pancreas: Unremarkable. No pancreatic ductal dilatation or surrounding inflammatory changes. Spleen: Normal in size without focal abnormality. Adrenals/Urinary Tract: Solitary native left kidney demonstrates atrophy, unchanged. Bilateral adrenal glands are within normal limits. There is a transplanted left lower quadrant kidney there is prominence of the left renal collecting system without obstructing calculus similar to prior. No significant perinephric stranding or perinephric fluid collection. The bladder is within normal limits. Stomach/Bowel: Stomach is within normal limits. Appendix appears normal. No evidence of bowel wall thickening, distention, or inflammatory changes. Vascular/Lymphatic: No significant vascular findings are present. No enlarged abdominal or pelvic lymph nodes. Reproductive: Status post hysterectomy. No adnexal masses. Other: There is scarring in the lower anterior abdominal wall. Low-density collection in the  subcutaneous  tissues of the left anterior abdominal wall measures 3.2 x 3.9 cm and appears unchanged. There is no abdominal wall hernia or ascites. Musculoskeletal: No fracture is seen. IMPRESSION: 1. No acute localizing process in the abdomen or pelvis. 2. Transplanted left lower quadrant kidney with prominence of the left renal collecting system without obstructing calculus, similar to prior. 3. Stable low-density collection in the subcutaneous tissues of the left anterior abdominal wall. Electronically Signed   By: Darliss Cheney M.D.   On: 06/06/2023 20:34    Today   Subjective    [name] today has no headache,no chest abdominal pain,no new weakness tingling or numbness, feels much better wants to go home today.     Objective   Blood pressure 128/84, pulse 84, temperature 97.6 F (36.4 C), temperature source Oral, resp. rate 20, height 5\' 6"  (1.676 m), weight 82.6 kg, SpO2 96%.   Intake/Output Summary (Last 24 hours) at 06/14/2023 1046 Last data filed at 06/14/2023 0800 Gross per 24 hour  Intake 120 ml  Output 1 ml  Net 119 ml    Exam  Awake Alert, No new F.N deficits,    [place].AT,PERRAL Supple Neck,   Symmetrical Chest wall movement, Good air movement bilaterally, CTAB RRR,No Gallops,   +ve B.Sounds, Abd Soft, Non tender,  No Cyanosis, Clubbing or edema    Data Review   Recent Labs  Lab 06/10/23 0632 06/11/23 0815 06/12/23 0546 06/13/23 0457 06/14/23 0450  WBC 2.9* 5.9 3.7* 4.6 4.2  HGB 8.2* 10.6* 8.9* 9.6* 8.7*  HCT 25.5* 33.4* 28.6* 29.3* 27.3*  PLT 147* 193 153 169 176  MCV 89.5 90.8 90.8 88.3 89.5  MCH 28.8 28.8 28.3 28.9 28.5  MCHC 32.2 31.7 31.1 32.8 31.9  RDW 15.1 15.1 15.5 15.0 14.9  LYMPHSABS 0.4* 0.7  --  0.6* 0.5*  MONOABS 0.3 0.4  --  0.4 0.5  EOSABS 0.1 0.1  --  0.1 0.1  BASOSABS 0.1 0.1  --  0.1 0.1    Recent Labs  Lab 06/07/23 1608 06/07/23 1759 06/08/23 0409 06/09/23 0352 06/09/23 1516 06/10/23 1610 06/11/23 0815 06/12/23 0545  06/12/23 0546 06/13/23 0457 06/14/23 0450  NA 134*  --  137 131*   < > 136 136  --  135 135 131*  K 4.5  --  4.2 5.7*   < > 4.2 4.4  --  4.4 4.2 4.6  CL 104  --  105 103   < > 103 102  --  104 104 100  CO2 20*  --  23 23   < > 24 21*  --  23 24 24   ANIONGAP 10  --  9 5   < > 9 13  --  8 7 7   GLUCOSE 323*  --  209* 457*   < > 208* 246*  --  161* 212* 313*  BUN 22*  --  21* 23*   < > 23* 30*  --  22* 19 22*  CREATININE 1.50*  --  1.48* 1.59*   < > 1.41* 1.60*  --  1.34* 1.37* 1.34*  AST 16  --   --   --   --   --  29  --   --   --   --   ALT 14  --   --   --   --   --  21  --   --   --   --   ALKPHOS 67  --   --   --   --   --  58  --   --   --   --   BILITOT 0.8  --   --   --   --   --  0.5  --   --   --   --   ALBUMIN 4.0  --   --  3.2*  --   --  4.2  --   --   --   --   CRP  --   --   --   --   --   --   --  0.9  --  1.1* 0.9  PROCALCITON  --   --   --   --   --   --   --  <0.10  --  <0.10 <0.10  LATICACIDVEN  --  1.0  --   --   --   --   --   --   --   --   --   HGBA1C  --   --   --  12.1*  --   --   --   --   --   --   --   BNP  --   --   --   --   --   --   --   --   --  18.9 8.8  MG  --   --  1.5*  --   --   --  1.7 1.5*  --  1.6* 2.4  PHOS  --   --  3.3 3.2  --   --   --   --   --  3.6 3.8  CALCIUM 8.6*  --  8.2* 7.8*   < > 7.8* 8.8*  --  7.6* 7.8* 8.1*   < > = values in this interval not displayed.    Total Time in preparing paper work, data evaluation and todays exam - 35 minutes  Signature  -    Susa Raring M.D on 06/14/2023 at 10:46 AM   -  To page go to [email or web address]

## 2023-06-14 NOTE — Plan of Care (Signed)

## 2023-06-14 NOTE — Discharge Instructions (Addendum)
 Follow with Primary MD Fleet Contras, MD in 7 days, follow-up with your gastroenterologist and transplant physician within a week of discharge.  Get CBC, CMP, 2 view Chest X ray -  checked next visit with your primary MD    Activity: As tolerated with Full fall precautions use walker/cane & assistance as needed  Disposition Home   Diet: Heart Healthy Low Carb diet, check CBGs q. [facility]  Special Instructions: If you have smoked or chewed Tobacco  in the last 2 yrs please stop smoking, stop any regular Alcohol  and or any Recreational drug use.  On your next visit with your primary care physician please Get Medicines reviewed and adjusted.  Please request your Prim.MD to go over all Hospital Tests and Procedure/Radiological results at the follow up, please get all Hospital records sent to your Prim MD by signing hospital release before you go home.  If you experience worsening of your admission symptoms, develop shortness of breath, life threatening emergency, suicidal or homicidal thoughts you must seek medical attention immediately by calling 911 or calling your MD immediately  if symptoms less severe.  You Must read complete instructions/literature along with all the possible adverse reactions/side effects for all the Medicines you take and that have been prescribed to you. Take any new Medicines after you have completely understood and accpet all the possible adverse reactions/side effects.   Do not drive when taking Pain medications.  Do not take more than prescribed Pain, Sleep and Anxiety Medications  Wear Seat belts while driving.

## 2023-06-14 NOTE — Progress Notes (Signed)
 Patient discharge for home. Discharge is currently being done by the discharge nurse on duty.

## 2023-06-14 NOTE — Progress Notes (Signed)
      Progress Note   Subjective  Doing well. Tolerated dinner last night, has not tried anything to eat yet today.    Objective   Vital signs in last 24 hours: Temp:  [97.6 F (36.4 C)-98.5 F (36.9 C)] 97.6 F (36.4 C) (04/01 0800) Pulse Rate:  [79-89] 84 (04/01 0800) Resp:  [18-20] 20 (04/01 0800) BP: (101-128)/(74-93) 128/84 (04/01 0800) SpO2:  [94 %-100 %] 96 % (04/01 0800) Last BM Date : 06/12/23 General:    AA female in NAD Neurologic:  Alert and oriented,  grossly normal neurologically. Psych:  Cooperative. Normal mood and affect.  Intake/Output from previous day: No intake/output data recorded. Intake/Output this shift: Total I/O In: 120 [P.O.:120] Out: 1 [Stool:1]  Lab Results: Recent Labs    06/12/23 0546 06/13/23 0457 06/14/23 0450  WBC 3.7* 4.6 4.2  HGB 8.9* 9.6* 8.7*  HCT 28.6* 29.3* 27.3*  PLT 153 169 176   BMET Recent Labs    06/12/23 0546 06/13/23 0457 06/14/23 0450  NA 135 135 131*  K 4.4 4.2 4.6  CL 104 104 100  CO2 23 24 24   GLUCOSE 161* 212* 313*  BUN 22* 19 22*  CREATININE 1.34* 1.37* 1.34*  CALCIUM 7.6* 7.8* 8.1*   LFT No results for input(s): "PROT", "ALBUMIN", "AST", "ALT", "ALKPHOS", "BILITOT", "BILIDIR", "IBILI" in the last 72 hours. PT/INR No results for input(s): "LABPROT", "INR" in the last 72 hours.  Studies/Results: DG Abd Portable 1V Result Date: 06/13/2023 CLINICAL DATA:  [id number] Nausea AND vomiting [id number] EXAM: PORTABLE ABDOMEN - 1 VIEW COMPARISON:  KUB, 06/11/2023 CT AP, 06/06/2023. FINDINGS: The bowel gas pattern is normal. Cholecystectomy clips, and additional surgical clips within the pelvis. No radio-opaque calculi or other significant radiographic abnormality are seen. IMPRESSION: Nonobstructed bowel-gas pattern. Electronically Signed   By: Roanna Banning M.D.   On: 06/13/2023 07:04       Assessment / Plan:    38 y/o female ESRD here with the following  Nausea and vomiting History of  gastroparesis GERD Recent C Diff / norovirus  Added Reglan yesterday and continued pepcid, carafate. Holding PPI in light of recent C diff. On compazine. She did well overnight, tolerated dinner, has not eaten yet today. Would continue low dose Reglan for now 5mg  BID to TID PRN, continue pepcid, and compazine PRN. Suspect she had a flare of gastroparesis leading to this. She has had an EGD this past fall, do not think we need to pursue endoscopic evaluation at this time.  If she eats well today she can go home, and we can coordinate outpatient follow up. I would continue Reglan 5mg  PRN as outpatient. Discussed with her this can be associated with tachyphylaxis if used routinely, sounds like she has had that in the past.   We will sign off for now given her improvement and anticipated discharge later today. call with questions otherwise.  Harlin Rain, MD [facility]

## 2023-06-14 NOTE — TOC Transition Note (Signed)
 Transition of Care [facility]) - Discharge Note   Patient Details  Name: [name] MRN: [id number] Date of Birth: 19-Jul-1985  [facility] CM/SW Contact:  Gordy Clement, RN Phone Number: 06/14/2023, 10:28 AM   Clinical Narrative:    Patient will DC to home today. No TOC needs identified. Patient will follow up as directed on AVS   Family will transport            Patient Goals and CMS Choice            Discharge Placement                       Discharge Plan and Services Additional resources added to the After Visit Summary for                                       Social Drivers of Health (SDOH) Interventions SDOH Screenings   Food Insecurity: Food Insecurity Present (06/11/2023)  Housing: Patient Declined (06/11/2023)  Transportation Needs: No Transportation Needs (06/11/2023)  Recent Concern: Transportation Needs - Unmet Transportation Needs (05/23/2023)   Received from [facility]  Utilities: At Risk (06/11/2023)  Depression (PHQ2-9): Low Risk  (04/19/2018)  Financial Resource Strain: High Risk (05/11/2023)   Received from [facility]  Physical Activity: Sufficiently Active (10/24/2017)  Social Connections: Moderately Isolated (06/11/2023)  Stress: No Stress Concern Present (10/24/2017)  Tobacco Use: Low Risk  (06/11/2023)     Readmission Risk Interventions    06/09/2023    4:15 PM  Readmission Risk Prevention Plan  Transportation Screening Complete  Medication Review (RN Care Manager) Referral to Pharmacy  PCP or Specialist appointment within 3-5 days of discharge Complete  HRI or Home Care Consult Complete  SW Recovery Care/Counseling Consult Complete  Palliative Care Screening Not Applicable  Skilled Nursing Facility Not Applicable

## 2023-06-14 NOTE — Plan of Care (Signed)
                                      [facility]                            [address] [place], [place] [address]      [name] was admitted to the Hospital on 06/11/2023 and Discharged  06/14/2023 and should be excused from work/school   for 4  days starting from date -  06/11/2023 , may return to work/school without any restrictions.  Call Susa Raring MD, [facility]  [phone] with questions.  Susa Raring M.D on 06/14/2023,at 5:08 PM  [facility]   Office  [phone]

## 2023-06-18 ENCOUNTER — Emergency Department (HOSPITAL_BASED_OUTPATIENT_CLINIC_OR_DEPARTMENT_OTHER)

## 2023-06-18 ENCOUNTER — Encounter (HOSPITAL_BASED_OUTPATIENT_CLINIC_OR_DEPARTMENT_OTHER): Payer: Self-pay

## 2023-06-18 ENCOUNTER — Other Ambulatory Visit: Payer: Self-pay

## 2023-06-18 ENCOUNTER — Emergency Department (HOSPITAL_BASED_OUTPATIENT_CLINIC_OR_DEPARTMENT_OTHER): Admission: EM | Admit: 2023-06-18 | Discharge: 2023-06-18 | Disposition: A | Discharge status: Home / Self Care

## 2023-06-18 DIAGNOSIS — E1165 Type 2 diabetes mellitus with hyperglycemia: Secondary | ICD-10-CM | POA: Diagnosis not present

## 2023-06-18 DIAGNOSIS — Z794 Long term (current) use of insulin: Secondary | ICD-10-CM | POA: Insufficient documentation

## 2023-06-18 DIAGNOSIS — Z91010 Allergy to peanuts: Secondary | ICD-10-CM | POA: Diagnosis not present

## 2023-06-18 DIAGNOSIS — R1013 Epigastric pain: Secondary | ICD-10-CM | POA: Diagnosis present

## 2023-06-18 DIAGNOSIS — Z79899 Other long term (current) drug therapy: Secondary | ICD-10-CM | POA: Insufficient documentation

## 2023-06-18 DIAGNOSIS — Z7901 Long term (current) use of anticoagulants: Secondary | ICD-10-CM | POA: Insufficient documentation

## 2023-06-18 DIAGNOSIS — R112 Nausea with vomiting, unspecified: Secondary | ICD-10-CM | POA: Insufficient documentation

## 2023-06-18 DIAGNOSIS — R197 Diarrhea, unspecified: Secondary | ICD-10-CM | POA: Insufficient documentation

## 2023-06-18 DIAGNOSIS — R109 Unspecified abdominal pain: Secondary | ICD-10-CM

## 2023-06-18 LAB — URINALYSIS, ROUTINE W REFLEX MICROSCOPIC
Bacteria, UA: NONE SEEN
Bilirubin Urine: NEGATIVE
Glucose, UA: >1000 mg/dL — AB
Hgb urine dipstick: NEGATIVE
Ketones, ur: NEGATIVE mg/dL
Leukocytes,Ua: NEGATIVE
Nitrite: NEGATIVE
Specific Gravity, Urine: 1.027 (ref 1.005–1.030)
pH: 6 (ref 5.0–8.0)

## 2023-06-18 LAB — PREGNANCY, URINE: Preg Test, Ur: NEGATIVE

## 2023-06-18 LAB — COMPREHENSIVE METABOLIC PANEL WITH GFR
ALT: 12 U/L (ref 0–44)
AST: 15 U/L (ref 15–41)
Albumin: 4.5 g/dL (ref 3.5–5.0)
Alkaline Phosphatase: 67 U/L (ref 38–126)
Anion gap: 12 (ref 5–15)
BUN: 17 mg/dL (ref 6–20)
CO2: 28 mmol/L (ref 22–32)
Calcium: 8.1 mg/dL — ABNORMAL LOW (ref 8.9–10.3)
Chloride: 92 mmol/L — ABNORMAL LOW (ref 98–111)
Creatinine, Ser: 1.74 mg/dL — ABNORMAL HIGH (ref 0.44–1.00)
GFR, Estimated: 38 mL/min — ABNORMAL LOW (ref >60)
Glucose, Bld: 468 mg/dL — ABNORMAL HIGH (ref 70–99)
Potassium: 3.9 mmol/L (ref 3.5–5.1)
Sodium: 132 mmol/L — ABNORMAL LOW (ref 135–145)
Total Bilirubin: 0.5 mg/dL (ref 0.0–1.2)
Total Protein: 8.8 g/dL — ABNORMAL HIGH (ref 6.5–8.1)

## 2023-06-18 LAB — CBC
HCT: 30.7 % — ABNORMAL LOW (ref 36.0–46.0)
Hemoglobin: 10 g/dL — ABNORMAL LOW (ref 12.0–15.0)
MCH: 29 pg (ref 26.0–34.0)
MCHC: 32.6 g/dL (ref 30.0–36.0)
MCV: 89 fL (ref 80.0–100.0)
Platelets: 237 10*3/uL (ref 150–400)
RBC: 3.45 MIL/uL — ABNORMAL LOW (ref 3.87–5.11)
RDW: 14.4 % (ref 11.5–15.5)
WBC: 4.8 10*3/uL (ref 4.0–10.5)
nRBC: 0 % (ref 0.0–0.2)

## 2023-06-18 LAB — LIPASE, BLOOD: Lipase: 15 U/L (ref 11–51)

## 2023-06-18 LAB — CBG MONITORING, ED: Glucose-Capillary: 328 mg/dL — ABNORMAL HIGH (ref 70–99)

## 2023-06-18 MED ORDER — PROCHLORPERAZINE EDISYLATE 10 MG/2ML IJ SOLN
10.0000 mg | Freq: Once | INTRAMUSCULAR | Status: AC
  Administered 2023-06-18: 10 mg via INTRAVENOUS
  Filled 2023-06-18: qty 2

## 2023-06-18 MED ORDER — HYDROMORPHONE HCL 1 MG/ML IJ SOLN
1.0000 mg | Freq: Once | INTRAMUSCULAR | Status: AC
  Administered 2023-06-18: 1 mg via INTRAVENOUS
  Filled 2023-06-18: qty 1

## 2023-06-18 MED ORDER — SODIUM CHLORIDE 0.9 % IV BOLUS
1000.0000 mL | Freq: Once | INTRAVENOUS | Status: AC
  Administered 2023-06-18: 1000 mL via INTRAVENOUS

## 2023-06-18 MED ORDER — METOCLOPRAMIDE HCL 5 MG/ML IJ SOLN
10.0000 mg | Freq: Once | INTRAMUSCULAR | Status: DC
  Filled 2023-06-18: qty 2

## 2023-06-18 NOTE — ED Triage Notes (Signed)
 Pt reports N/V and abd pain since yesterday. Pt reports generalized upper abd pain. Pt denies any recent contacts. Pt is carrying empty energy drink with her.

## 2023-06-18 NOTE — ED Notes (Signed)
 Pt c/o episode of SHOB, pt oxygen saturation found to be low at 85%, pt placed on 2L [place]. Pt saturation now 95% on 2L. Pt in NAD. Respiratory and EDP notified.

## 2023-06-18 NOTE — ED Provider Notes (Signed)
  EMERGENCY DEPARTMENT AT [facility] Provider Note   CSN: [id number] Arrival date & time: 06/18/23  1050     History  Chief Complaint  Patient presents with   Abdominal Pain   Nausea   HPI [name] is a 38 y.o. female status post kidney transplant December, type 2 diabetes, esophageal stricture presenting for abdominal pain.  She states this started 4 days ago and then 2 days ago she started to have nausea vomiting and diarrhea.  The abdominal pain is epigastric and nonradiating otherwise.  She denies chest pain shortness of breath.  She states she is having difficulty keeping down her "transplant drugs".  She denies sick contacts but states she works at [employer] and possible she had contact with someone who was sick.  Denies urinary symptoms.  She also mention that she has been taking BC powders routinely to help with the abdominal pain for the last 4 days.  She is requesting Compazine for her nausea.    Abdominal Pain      Home Medications Prior to Admission medications   Medication Sig Start Date End Date Taking? Authorizing Provider  ELIQUIS 5 MG TABS tablet Take 5 mg by mouth 2 (two) times daily. 05/16/23  Yes [provider]  calcitRIOL (ROCALTROL) 0.5 MCG capsule Take 0.5 mcg by mouth daily. 05/16/23   [provider]  Calcium Carbonate Antacid (CALCIUM CARBONATE, DOSED IN MG ELEMENTAL CALCIUM,) 1250 MG/5ML SUSP Take 500 mg of elemental calcium by mouth 3 (three) times daily. 05/15/23   [provider]  carvedilol (COREG) 12.5 MG tablet Take 12.5 mg by mouth 2 (two) times daily. 05/16/23   [provider]  conjugated estrogens (PREMARIN) vaginal cream Place 1 applicator vaginally daily as needed (for dryness). 05/23/23 05/22/24  [provider]  famotidine (PEPCID) 20 MG tablet Take 1 tablet (20 mg total) by mouth 2 (two) times daily. 06/10/23   Barnetta Chapel, MD  fidaxomicin (DIFICID) 200 MG TABS tablet  Take 1 tablet (200 mg total) by mouth 2 (two) times daily for 8 days. 06/10/23 06/18/23  Berton Mount I, MD  gabapentin (NEURONTIN) 300 MG capsule Take 300 mg by mouth at bedtime.    [provider]  hydrOXYzine (ATARAX) 25 MG tablet Take 25 mg by mouth daily as needed for itching. 05/16/23   [provider]  Insulin Aspart FlexPen (NOVOLOG) 100 UNIT/ML inject 4-10 units  under the skin with meals as directed.  Max 30 units per day 05/19/23   [provider]  insulin degludec (TRESIBA) 100 UNIT/ML FlexTouch Pen Inject 30 Units into the skin daily. 05/19/23 05/13/24  [provider]  metoCLOPramide (REGLAN) 5 MG tablet Take 1 tablet (5 mg total) by mouth every 8 (eight) hours as needed for nausea. 06/14/23 06/13/24  [name], MD  mycophenolate (CELLCEPT) 250 MG capsule Take 1 capsule (250 mg total) by mouth 2 (two) times daily. Please resume your home dose of 750 twice daily dose once you have finished your deficit treatment on 06/18/2023. 06/14/23   [name], MD  NIFEdipine (PROCARDIA-XL/NIFEDICAL-XL) 30 MG 24 hr tablet Take 30 mg by mouth daily. 05/19/23 05/18/24  [provider]  oxyCODONE (OXY IR/ROXICODONE) 5 MG immediate release tablet Take 1 tablet (5 mg total) by mouth every 6 (six) hours as needed for moderate pain (pain score 4-6) or breakthrough pain. 06/10/23   Berton Mount I, MD  pravastatin (PRAVACHOL) 40 MG tablet Take 40 mg  by mouth daily. 05/16/23   [provider]  predniSONE (DELTASONE) 5 MG tablet Take 5 mg by mouth daily with breakfast.    [provider]  promethazine (PHENERGAN) 25 MG tablet Take 0.5 tablets (12.5 mg total) by mouth every 8 (eight) hours as needed for nausea or vomiting. 06/14/23   [name], MD  sucralfate (CARAFATE) 1 g tablet TAKE 1 TABLET BY MOUTH FOUR TIMES DAILY BEFORE MEALS AND NIGHTY. DISSOLVE IN TABLESPOON OF LIQUID OR APPLEASAUCE 05/31/23   [provider]   sulfamethoxazole-trimethoprim (BACTRIM) 400-80 MG tablet Take 1 tablet by mouth 3 (three) times a week. Monday, Wednesday and Fridays 05/16/23   [provider]  tacrolimus (PROGRAF) 5 MG capsule Take 1 capsule (5 mg total) by mouth 2 (two) times daily. 06/10/23   Barnetta Chapel, MD  valGANciclovir (VALCYTE) 450 MG tablet Take 1 tablet (450 mg total) by mouth daily. 06/11/23   Barnetta Chapel, MD      Allergies    Diphenhydramine, Peanut-containing drug products, Contrast media [iodinated contrast media], Shellfish allergy, Chlorhexidine, Ferrous sulfate, and Iron dextran    Review of Systems   Review of Systems  Gastrointestinal:  Positive for abdominal pain.    Physical Exam Updated Vital Signs BP (!) 144/98   Pulse 76   Temp 98.4 F (36.9 C) (Oral)   Resp 16   Ht 5\' 6"  (1.676 m)   Wt 81.6 kg   LMP  (LMP Unknown)   SpO2 100%   BMI 29.05 kg/m  Physical Exam Vitals and nursing note reviewed.  HENT:     Head: Normocephalic and atraumatic.     Mouth/Throat:     Mouth: Mucous membranes are moist.  Eyes:     General:        Right eye: No discharge.        Left eye: No discharge.     Conjunctiva/sclera: Conjunctivae normal.  Cardiovascular:     Rate and Rhythm: Normal rate and regular rhythm.     Pulses: Normal pulses.     Heart sounds: Normal heart sounds.  Pulmonary:     Effort: Pulmonary effort is normal.     Breath sounds: Normal breath sounds.  Abdominal:     General: Abdomen is flat.     Palpations: Abdomen is soft.     Tenderness: There is abdominal tenderness in the epigastric area.  Skin:    General: Skin is warm and dry.  Neurological:     General: No focal deficit present.  Psychiatric:        Mood and Affect: Mood normal.     ED Results / Procedures / Treatments   Labs (all labs ordered are listed, but only abnormal results are displayed) Labs Reviewed  COMPREHENSIVE METABOLIC PANEL WITH GFR - Abnormal; Notable for the following  components:      Result Value   Sodium 132 (*)    Chloride 92 (*)    Glucose, Bld 468 (*)    Creatinine, Ser 1.74 (*)    Calcium 8.1 (*)    Total Protein 8.8 (*)    GFR, Estimated 38 (*)    All other components within normal limits  CBC - Abnormal; Notable for the following components:   RBC 3.45 (*)    Hemoglobin 10.0 (*)    HCT 30.7 (*)    All other components within normal limits  URINALYSIS, ROUTINE W REFLEX MICROSCOPIC - Abnormal; Notable for the following components:   Glucose,  UA >1,000 (*)    Protein, ur TRACE (*)    All other components within normal limits  CBG MONITORING, ED - Abnormal; Notable for the following components:   Glucose-Capillary 328 (*)    All other components within normal limits  LIPASE, BLOOD  PREGNANCY, URINE    EKG None  Radiology CT ABDOMEN PELVIS WO CONTRAST Result Date: 06/18/2023 CLINICAL DATA:  Acute abdominal pain and nausea. Renal transplant patient. EXAM: CT ABDOMEN AND PELVIS WITHOUT CONTRAST TECHNIQUE: Multidetector CT imaging of the abdomen and pelvis was performed following the standard protocol without IV contrast. RADIATION DOSE REDUCTION: This exam was performed according to the departmental dose-optimization program which includes automated exposure control, adjustment of the mA and/or kV according to patient size and/or use of iterative reconstruction technique. COMPARISON:  06/06/2023 FINDINGS: Lower chest: No acute findings. Hepatobiliary: No mass visualized on this unenhanced exam. Prior cholecystectomy. No evidence of biliary obstruction. Pancreas: No mass or inflammatory process visualized on this unenhanced exam. Spleen:  Within normal limits in size. Adrenals/Urinary tract: Normal adrenal glands. Right kidney is absent. Severe diffuse atrophy of native left kidney again seen. Stable postop changes in the right iliac fossa likely due to previous renal transplant in this area. Renal transplant is seen in the left iliac fossa. No No  evidence of ureteral calculi or hydronephrosis. No No evidence of peritransplant fluid collection. Unremarkable unopacified urinary bladder. Stomach/Bowel: No evidence of obstruction, inflammatory process, or abnormal fluid collections. Vascular/Lymphatic: No pathologically enlarged lymph nodes identified. No evidence of abdominal aortic aneurysm. Reproductive: Prior hysterectomy noted. Adnexal regions are unremarkable in appearance. Other: Small fluid collection in the subcutaneous tissues of the left lower quadrant anterior abdominal wall measures 3.5 x 2.8 cm, slightly decreased in size since previous study. No hernia identified. Musculoskeletal:  No suspicious bone lesions identified. IMPRESSION: No acute findings. Left iliac fossa renal transplant. No evidence of hydronephrosis or peritransplant fluid collection. Slight decrease in size of small fluid collection in the subcutaneous tissues of the left lower quadrant abdominal wall, likely postop in etiology. Electronically Signed   By: Danae Orleans M.D.   On: 06/18/2023 14:34    Procedures Procedures    Medications Ordered in ED Medications  sodium chloride 0.9 % bolus 1,000 mL (0 mLs Intravenous Stopped 06/18/23 1453)  HYDROmorphone (DILAUDID) injection 1 mg (1 mg Intravenous Given 06/18/23 1315)  prochlorperazine (COMPAZINE) injection 10 mg (10 mg Intravenous Given 06/18/23 1320)    ED Course/ Medical Decision Making/ A&P                                 Medical Decision Making Amount and/or Complexity of Data Reviewed Labs: ordered. Radiology: ordered.  Risk Prescription drug management.   Initial Impression and Ddx 38 year old well-appearing female presenting for abdominal pain.  Exam revealed epigastric tenderness.  DDx includes gastric perforation, acute pancreatitis, acute cholecystitis, kidney stone, ACS, other. Patient PMH that increases complexity of ED encounter:  status post kidney transplant December, type 2 diabetes,  esophageal stricture   Interpretation of Diagnostics - I independent reviewed and interpreted the labs as followed: Hyperglycemia (468), anemia (improved from last check), reduced GFR, glucosuria  - I independently visualized the following imaging with scope of interpretation limited to determining acute life threatening conditions related to emergency care: CT abdomen/pelvis, which revealed no acute findings  Patient Reassessment and Ultimate Disposition/Management On reassessment patient states she felt "much better".  Workup was  overall reassuring.  She was notably hyperglycemic but workup does not suggest DKA.  On recheck after treatment CBG was 328.  Advised to continue her NovoLog and [place] as prescribed at home.  Labs also indicated possible AKI, suspect prerenal from vomiting.  Treated with IV fluids.  Advised her to follow-up with her PCP.  Discussed return precautions.  Discharged good condition.  Patient management required discussion with the following services or consulting groups:  None  Complexity of Problems Addressed Acute complicated illness or Injury  Additional Data Reviewed and Analyzed Further history obtained from: Further history from spouse/family member, Past medical history and medications listed in the EMR, and Prior ED visit notes  Patient Encounter Risk Assessment None         Final Clinical Impression(s) / ED Diagnoses Final diagnoses:  Abdominal pain, unspecified abdominal location    Rx / DC Orders ED Discharge Orders     None         Gareth Eagle, PA-C 06/18/23 1528    Durwin Glaze, MD 06/19/23 [phone]

## 2023-06-18 NOTE — ED Triage Notes (Signed)
 Pt also had kidney transplant back in December 2024.

## 2023-06-18 NOTE — Discharge Instructions (Addendum)
 Evaluation for your abdominal pain was overall reassuring.  CT scan did not indicate an acute infection or process in your abdomen.  Your labs did indicate you were hyperglycemic.  Your initial blood sugar was over 400 but after fluids it did improve to 300s.  Please continue taking your NovoLog and Evaristo Bury as prescribed.  Recommend you follow-up with your PCP.  If you have worsening abdominal pain, develop a fever, cannot tolerate fluid intake, persistent nausea vomiting diarrhea or any other concerning symptom please return to the ED for further evaluation.

## 2023-06-20 ENCOUNTER — Other Ambulatory Visit: Payer: Self-pay

## 2023-06-20 ENCOUNTER — Encounter (HOSPITAL_BASED_OUTPATIENT_CLINIC_OR_DEPARTMENT_OTHER): Payer: Self-pay

## 2023-06-20 DIAGNOSIS — R739 Hyperglycemia, unspecified: Secondary | ICD-10-CM | POA: Insufficient documentation

## 2023-06-20 DIAGNOSIS — Z794 Long term (current) use of insulin: Secondary | ICD-10-CM | POA: Insufficient documentation

## 2023-06-20 DIAGNOSIS — Z7901 Long term (current) use of anticoagulants: Secondary | ICD-10-CM | POA: Insufficient documentation

## 2023-06-20 DIAGNOSIS — R000 Tachycardia, unspecified: Secondary | ICD-10-CM | POA: Diagnosis not present

## 2023-06-20 DIAGNOSIS — K449 Diaphragmatic hernia without obstruction or gangrene: Secondary | ICD-10-CM | POA: Diagnosis not present

## 2023-06-20 DIAGNOSIS — N186 End stage renal disease: Secondary | ICD-10-CM | POA: Insufficient documentation

## 2023-06-20 DIAGNOSIS — R109 Unspecified abdominal pain: Secondary | ICD-10-CM | POA: Diagnosis present

## 2023-06-20 DIAGNOSIS — Z91010 Allergy to peanuts: Secondary | ICD-10-CM | POA: Insufficient documentation

## 2023-06-20 LAB — CBC
HCT: 29.7 % — ABNORMAL LOW (ref 36.0–46.0)
Hemoglobin: 9.5 g/dL — ABNORMAL LOW (ref 12.0–15.0)
MCH: 28.6 pg (ref 26.0–34.0)
MCHC: 32 g/dL (ref 30.0–36.0)
MCV: 89.5 fL (ref 80.0–100.0)
Platelets: 233 10*3/uL (ref 150–400)
RBC: 3.32 MIL/uL — ABNORMAL LOW (ref 3.87–5.11)
RDW: 14.3 % (ref 11.5–15.5)
WBC: 5.7 10*3/uL (ref 4.0–10.5)
nRBC: 0 % (ref 0.0–0.2)

## 2023-06-20 LAB — LIPASE, BLOOD: Lipase: 34 U/L (ref 11–51)

## 2023-06-20 LAB — COMPREHENSIVE METABOLIC PANEL WITH GFR
ALT: 13 U/L (ref 0–44)
AST: 17 U/L (ref 15–41)
Albumin: 4.5 g/dL (ref 3.5–5.0)
Alkaline Phosphatase: 66 U/L (ref 38–126)
Anion gap: 10 (ref 5–15)
BUN: 12 mg/dL (ref 6–20)
CO2: 24 mmol/L (ref 22–32)
Calcium: 7.5 mg/dL — ABNORMAL LOW (ref 8.9–10.3)
Chloride: 96 mmol/L — ABNORMAL LOW (ref 98–111)
Creatinine, Ser: 1.55 mg/dL — ABNORMAL HIGH (ref 0.44–1.00)
GFR, Estimated: 43 mL/min — ABNORMAL LOW (ref >60)
Glucose, Bld: 568 mg/dL (ref 70–99)
Potassium: 4.2 mmol/L (ref 3.5–5.1)
Sodium: 130 mmol/L — ABNORMAL LOW (ref 135–145)
Total Bilirubin: 0.4 mg/dL (ref 0.0–1.2)
Total Protein: 8.8 g/dL — ABNORMAL HIGH (ref 6.5–8.1)

## 2023-06-20 LAB — URINALYSIS, ROUTINE W REFLEX MICROSCOPIC
Bilirubin Urine: NEGATIVE
Glucose, UA: >1000 mg/dL — AB
Hgb urine dipstick: NEGATIVE
Ketones, ur: NEGATIVE mg/dL
Leukocytes,Ua: NEGATIVE
Nitrite: NEGATIVE
Protein, ur: NEGATIVE mg/dL
Specific Gravity, Urine: 1.03 (ref 1.005–1.030)
pH: 6 (ref 5.0–8.0)

## 2023-06-20 LAB — PREGNANCY, URINE: Preg Test, Ur: NEGATIVE

## 2023-06-20 NOTE — ED Triage Notes (Signed)
 Pt reports abdominal pain started yesterday, HA started this am Tylenol and IBU taken without relief Hx of kidney transplant

## 2023-06-21 ENCOUNTER — Emergency Department (HOSPITAL_BASED_OUTPATIENT_CLINIC_OR_DEPARTMENT_OTHER)
Admission: EM | Admit: 2023-06-21 | Discharge: 2023-06-21 | Disposition: A | Discharge status: Home / Self Care | Attending: Emergency Medicine | Admitting: Emergency Medicine

## 2023-06-21 DIAGNOSIS — K449 Diaphragmatic hernia without obstruction or gangrene: Secondary | ICD-10-CM

## 2023-06-21 DIAGNOSIS — R112 Nausea with vomiting, unspecified: Secondary | ICD-10-CM

## 2023-06-21 LAB — CBG MONITORING, ED: Glucose-Capillary: 210 mg/dL — ABNORMAL HIGH (ref 70–99)

## 2023-06-21 MED ORDER — FAMOTIDINE 20 MG PO TABS
20.0000 mg | ORAL_TABLET | Freq: Once | ORAL | Status: AC
  Administered 2023-06-21: 20 mg via ORAL
  Filled 2023-06-21: qty 1

## 2023-06-21 MED ORDER — SUCRALFATE 1 G PO TABS: 1.0000 g | ORAL_TABLET | Freq: Three times a day (TID) | ORAL | 0 refills | Status: AC

## 2023-06-21 MED ORDER — FAMOTIDINE 20 MG PO TABS: 20.0000 mg | ORAL_TABLET | Freq: Two times a day (BID) | ORAL | 0 refills | Status: AC

## 2023-06-21 MED ORDER — HYDROMORPHONE HCL 1 MG/ML IJ SOLN
1.0000 mg | Freq: Once | INTRAMUSCULAR | Status: AC
  Administered 2023-06-21: 1 mg via INTRAVENOUS
  Filled 2023-06-21: qty 1

## 2023-06-21 MED ORDER — PROMETHAZINE HCL 25 MG PO TABS: 25.0000 mg | ORAL_TABLET | Freq: Four times a day (QID) | ORAL | 0 refills | Status: DC | PRN

## 2023-06-21 MED ORDER — PROCHLORPERAZINE EDISYLATE 10 MG/2ML IJ SOLN
10.0000 mg | Freq: Once | INTRAMUSCULAR | Status: AC
  Administered 2023-06-21: 10 mg via INTRAVENOUS
  Filled 2023-06-21: qty 2

## 2023-06-21 MED ORDER — PANTOPRAZOLE SODIUM 40 MG IV SOLR
40.0000 mg | Freq: Once | INTRAVENOUS | Status: AC
  Administered 2023-06-21: 40 mg via INTRAVENOUS
  Filled 2023-06-21: qty 10

## 2023-06-21 MED ORDER — LACTATED RINGERS IV BOLUS
1000.0000 mL | Freq: Once | INTRAVENOUS | Status: AC
  Administered 2023-06-21: 1000 mL via INTRAVENOUS

## 2023-06-21 MED ORDER — ALUM & MAG HYDROXIDE-SIMETH 200-200-20 MG/5ML PO SUSP
30.0000 mL | Freq: Once | ORAL | Status: AC
  Administered 2023-06-21: 30 mL via ORAL
  Filled 2023-06-21: qty 30

## 2023-06-21 MED ORDER — INSULIN ASPART 100 UNIT/ML IV SOLN
10.0000 [IU] | Freq: Once | INTRAVENOUS | Status: AC
  Administered 2023-06-21: 10 [IU] via INTRAVENOUS

## 2023-06-21 MED ORDER — PROMETHAZINE HCL 25 MG RE SUPP: 25.0000 mg | Freq: Four times a day (QID) | RECTAL | 0 refills | Status: DC | PRN

## 2023-06-21 MED ORDER — OXYCODONE HCL 5 MG PO TABS: 5.0000 mg | ORAL_TABLET | Freq: Four times a day (QID) | ORAL | 0 refills | Status: DC | PRN

## 2023-06-21 NOTE — ED Notes (Signed)
 Patient placed on 2LNC at this time r/t desaturations following pain medication administration.

## 2023-06-21 NOTE — ED Provider Notes (Signed)
 [place] EMERGENCY DEPARTMENT AT [facility] Provider Note   CSN: [id number] Arrival date & time: 06/20/23  2208     History  Chief Complaint  Patient presents with   Abdominal Pain   Headache    [name] is a 38 y.o. female.  38 yo F here with hyperglycemia, emesis, abdominal pain. Has h/o hiatal hernia, ESRD s/p transplant x 2, most recently a few months ago. Can tolerate her chronic medications for the most part but will have some trouble with antiemetics. States she has epigastric pain and thinks this is a flare up of her hiatal hernia. Is supposed to have surgery for it but had renal transplant instead.    Abdominal Pain Headache Associated symptoms: abdominal pain        Home Medications Prior to Admission medications   Medication Sig Start Date End Date Taking? Authorizing Provider  promethazine (PHENERGAN) 25 MG suppository Place 1 suppository (25 mg total) rectally every 6 (six) hours as needed for nausea or vomiting. 06/21/23  Yes Adonica Fukushima, Barbara Cower, MD  promethazine (PHENERGAN) 25 MG tablet Take 1 tablet (25 mg total) by mouth every 6 (six) hours as needed for nausea or vomiting. 06/21/23  Yes [name], Barbara Cower, MD  calcitRIOL (ROCALTROL) 0.5 MCG capsule Take 0.5 mcg by mouth daily. 05/16/23   [provider]  Calcium Carbonate Antacid (CALCIUM CARBONATE, DOSED IN MG ELEMENTAL CALCIUM,) 1250 MG/5ML SUSP Take 500 mg of elemental calcium by mouth 3 (three) times daily. 05/15/23   [provider]  carvedilol (COREG) 12.5 MG tablet Take 12.5 mg by mouth 2 (two) times daily. 05/16/23   [provider]  conjugated estrogens (PREMARIN) vaginal cream Place 1 applicator vaginally daily as needed (for dryness). 05/23/23 05/22/24  [provider]  ELIQUIS 5 MG TABS tablet Take 5 mg by mouth 2 (two) times daily. 05/16/23   [provider]  famotidine (PEPCID) 20 MG tablet Take 1 tablet (20 mg total) by mouth 2 (two) times daily. 06/21/23    Teeghan Hammer, Barbara Cower, MD  gabapentin (NEURONTIN) 300 MG capsule Take 300 mg by mouth at bedtime.    [provider]  hydrOXYzine (ATARAX) 25 MG tablet Take 25 mg by mouth daily as needed for itching. 05/16/23   [provider]  Insulin Aspart FlexPen (NOVOLOG) 100 UNIT/ML inject 4-10 units  under the skin with meals as directed.  Max 30 units per day 05/19/23   [provider]  insulin degludec (TRESIBA) 100 UNIT/ML FlexTouch Pen Inject 30 Units into the skin daily. 05/19/23 05/13/24  [provider]  mycophenolate (CELLCEPT) 250 MG capsule Take 1 capsule (250 mg total) by mouth 2 (two) times daily. Please resume your home dose of 750 twice daily dose once you have finished your deficit treatment on 06/18/2023. 06/14/23   [name], MD  NIFEdipine (PROCARDIA-XL/NIFEDICAL-XL) 30 MG 24 hr tablet Take 30 mg by mouth daily. 05/19/23 05/18/24  [provider]  oxyCODONE (OXY IR/ROXICODONE) 5 MG immediate release tablet Take 1 tablet (5 mg total) by mouth every 6 (six) hours as needed for moderate pain (pain score 4-6) or breakthrough pain. 06/21/23   [name], Barbara Cower, MD  pravastatin (PRAVACHOL) 40 MG tablet Take 40 mg by mouth daily. 05/16/23   [provider]  predniSONE (DELTASONE) 5 MG tablet Take 5 mg by mouth daily with breakfast.    [provider]  sucralfate (CARAFATE) 1 g tablet Take 1 tablet (1 g total) by mouth 4 (four) times daily -  with meals and at bedtime for 15 days. 06/21/23 07/06/23  [name], Barbara Cower, MD  sulfamethoxazole-trimethoprim (BACTRIM) 400-80 MG tablet Take 1 tablet by mouth 3 (three) times a week. Monday, Wednesday and Fridays 05/16/23   [provider]  tacrolimus (PROGRAF) 5 MG capsule Take 1 capsule (5 mg total) by mouth 2 (two) times daily. 06/10/23   Barnetta Chapel, MD  valGANciclovir (VALCYTE) 450 MG tablet Take 1 tablet (450 mg total) by mouth daily. 06/11/23   Barnetta Chapel, MD      Allergies    Diphenhydramine,  Peanut-containing drug products, Contrast media [iodinated contrast media], Shellfish allergy, Chlorhexidine, Ferrous sulfate, and Iron dextran    Review of Systems   Review of Systems  Gastrointestinal:  Positive for abdominal pain.  Neurological:  Positive for headaches.    Physical Exam Updated Vital Signs BP (!) 122/100   Pulse (!) 103   Temp 97.8 F (36.6 C)   Resp 16   Ht 5\' 6"  (1.676 m)   Wt 81 kg   LMP  (LMP Unknown)   SpO2 98%   BMI 28.82 kg/m  Physical Exam Vitals and nursing note reviewed.  Constitutional:      Appearance: She is well-developed.  HENT:     Head: Normocephalic and atraumatic.  Eyes:     Pupils: Pupils are equal, round, and reactive to light.  Cardiovascular:     Rate and Rhythm: Regular rhythm. Tachycardia present.  Pulmonary:     Effort: No respiratory distress.     Breath sounds: No stridor.  Abdominal:     General: There is no distension.  Musculoskeletal:     Cervical back: Normal range of motion.  Skin:    General: Skin is warm and dry.  Neurological:     General: No focal deficit present.     Mental Status: She is alert.     ED Results / Procedures / Treatments   Labs (all labs ordered are listed, but only abnormal results are displayed) Labs Reviewed  COMPREHENSIVE METABOLIC PANEL WITH GFR - Abnormal; Notable for the following components:      Result Value   Sodium 130 (*)    Chloride 96 (*)    Glucose, Bld 568 (*)    Creatinine, Ser 1.55 (*)    Calcium 7.5 (*)    Total Protein 8.8 (*)    GFR, Estimated 43 (*)    All other components within normal limits  CBC - Abnormal; Notable for the following components:   RBC 3.32 (*)    Hemoglobin 9.5 (*)    HCT 29.7 (*)    All other components within normal limits  URINALYSIS, ROUTINE W REFLEX MICROSCOPIC - Abnormal; Notable for the following components:   Color, Urine COLORLESS (*)    Glucose, UA >1,000 (*)    Bacteria, UA RARE (*)    All other components within normal  limits  CBG MONITORING, ED - Abnormal; Notable for the following components:   Glucose-Capillary 210 (*)    All other components within normal limits  LIPASE, BLOOD  PREGNANCY, URINE    EKG None  Radiology No results found.  Procedures Procedures    Medications Ordered in ED Medications  famotidine (PEPCID) tablet 20 mg (has no administration in time range)  alum & mag hydroxide-simeth (MAALOX/MYLANTA) 200-200-20 MG/5ML suspension 30 mL (has no administration in time range)  lactated ringers bolus 1,000 mL (0 mLs Intravenous Stopped 06/21/23 0300)  insulin aspart (novoLOG) injection 10 Units (10  Units Intravenous Given 06/21/23 0154)  prochlorperazine (COMPAZINE) injection 10 mg (10 mg Intravenous Given 06/21/23 0155)  pantoprazole (PROTONIX) injection 40 mg (40 mg Intravenous Given 06/21/23 0200)  HYDROmorphone (DILAUDID) injection 1 mg (1 mg Intravenous Given 06/21/23 0158)    ED Course/ Medical Decision Making/ A&P                                 Medical Decision Making Amount and/or Complexity of Data Reviewed Labs: ordered.  Risk OTC drugs. Prescription drug management.  Suspect gastritis/indigestion.   CBG improved. Emesis improved. VSS. Tolerating PO. Suspect gastritis related to hiatal hernia, will ensure taking appropriate medications to avoid recurrent symptoms. Close fu w/ pcp and surgery recommended.   Final Clinical Impression(s) / ED Diagnoses Final diagnoses:  Hiatal hernia  Nausea and vomiting, unspecified vomiting type    Rx / DC Orders ED Discharge Orders          Ordered    famotidine (PEPCID) 20 MG tablet  2 times daily        06/21/23 0504    oxyCODONE (OXY IR/ROXICODONE) 5 MG immediate release tablet  Every 6 hours PRN        06/21/23 0504    sucralfate (CARAFATE) 1 g tablet  3 times daily with meals & bedtime        06/21/23 0504    promethazine (PHENERGAN) 25 MG tablet  Every 6 hours PRN        06/21/23 0504    promethazine (PHENERGAN) 25  MG suppository  Every 6 hours PRN        06/21/23 0504              Winta Barcelo, Barbara Cower, MD 06/21/23 [phone]

## 2023-07-08 ENCOUNTER — Emergency Department (HOSPITAL_BASED_OUTPATIENT_CLINIC_OR_DEPARTMENT_OTHER)

## 2023-07-08 ENCOUNTER — Emergency Department (HOSPITAL_BASED_OUTPATIENT_CLINIC_OR_DEPARTMENT_OTHER): Admitting: Radiology

## 2023-07-08 ENCOUNTER — Other Ambulatory Visit: Payer: Self-pay

## 2023-07-08 ENCOUNTER — Encounter (HOSPITAL_BASED_OUTPATIENT_CLINIC_OR_DEPARTMENT_OTHER): Payer: Self-pay | Admitting: Emergency Medicine

## 2023-07-08 ENCOUNTER — Emergency Department (HOSPITAL_BASED_OUTPATIENT_CLINIC_OR_DEPARTMENT_OTHER)
Admission: EM | Admit: 2023-07-08 | Discharge: 2023-07-08 | Disposition: A | Discharge status: Home / Self Care | Attending: Emergency Medicine | Admitting: Emergency Medicine

## 2023-07-08 DIAGNOSIS — Z91010 Allergy to peanuts: Secondary | ICD-10-CM | POA: Diagnosis not present

## 2023-07-08 DIAGNOSIS — S59912A Unspecified injury of left forearm, initial encounter: Secondary | ICD-10-CM | POA: Diagnosis present

## 2023-07-08 DIAGNOSIS — M549 Dorsalgia, unspecified: Secondary | ICD-10-CM | POA: Diagnosis not present

## 2023-07-08 DIAGNOSIS — Y92410 Unspecified street and highway as the place of occurrence of the external cause: Secondary | ICD-10-CM | POA: Insufficient documentation

## 2023-07-08 DIAGNOSIS — R519 Headache, unspecified: Secondary | ICD-10-CM | POA: Insufficient documentation

## 2023-07-08 DIAGNOSIS — N186 End stage renal disease: Secondary | ICD-10-CM | POA: Diagnosis not present

## 2023-07-08 DIAGNOSIS — R6884 Jaw pain: Secondary | ICD-10-CM | POA: Diagnosis not present

## 2023-07-08 DIAGNOSIS — R1032 Left lower quadrant pain: Secondary | ICD-10-CM | POA: Diagnosis not present

## 2023-07-08 DIAGNOSIS — Z940 Kidney transplant status: Secondary | ICD-10-CM | POA: Insufficient documentation

## 2023-07-08 DIAGNOSIS — T07XXXA Unspecified multiple injuries, initial encounter: Secondary | ICD-10-CM

## 2023-07-08 DIAGNOSIS — S5012XA Contusion of left forearm, initial encounter: Secondary | ICD-10-CM | POA: Insufficient documentation

## 2023-07-08 DIAGNOSIS — S20219A Contusion of unspecified front wall of thorax, initial encounter: Secondary | ICD-10-CM | POA: Insufficient documentation

## 2023-07-08 DIAGNOSIS — Z7901 Long term (current) use of anticoagulants: Secondary | ICD-10-CM | POA: Insufficient documentation

## 2023-07-08 LAB — COMPREHENSIVE METABOLIC PANEL WITH GFR
ALT: 14 U/L (ref 0–44)
AST: 17 U/L (ref 15–41)
Albumin: 4.2 g/dL (ref 3.5–5.0)
Alkaline Phosphatase: 74 U/L (ref 38–126)
Anion gap: 11 (ref 5–15)
BUN: 24 mg/dL — ABNORMAL HIGH (ref 6–20)
CO2: 21 mmol/L — ABNORMAL LOW (ref 22–32)
Calcium: 8 mg/dL — ABNORMAL LOW (ref 8.9–10.3)
Chloride: 106 mmol/L (ref 98–111)
Creatinine, Ser: 1.61 mg/dL — ABNORMAL HIGH (ref 0.44–1.00)
GFR, Estimated: 41 mL/min — ABNORMAL LOW (ref >60)
Glucose, Bld: 178 mg/dL — ABNORMAL HIGH (ref 70–99)
Potassium: 4.3 mmol/L (ref 3.5–5.1)
Sodium: 138 mmol/L (ref 135–145)
Total Bilirubin: 0.3 mg/dL (ref 0.0–1.2)
Total Protein: 8.1 g/dL (ref 6.5–8.1)

## 2023-07-08 LAB — CBC WITH DIFFERENTIAL/PLATELET
Abs Immature Granulocytes: 0.07 10*3/uL (ref 0.00–0.07)
Basophils Absolute: 0.1 10*3/uL (ref 0.0–0.1)
Basophils Relative: 1 %
Eosinophils Absolute: 0.3 10*3/uL (ref 0.0–0.5)
Eosinophils Relative: 5 %
HCT: 28 % — ABNORMAL LOW (ref 36.0–46.0)
Hemoglobin: 8.9 g/dL — ABNORMAL LOW (ref 12.0–15.0)
Immature Granulocytes: 1 %
Lymphocytes Relative: 15 %
Lymphs Abs: 0.9 10*3/uL (ref 0.7–4.0)
MCH: 28.8 pg (ref 26.0–34.0)
MCHC: 31.8 g/dL (ref 30.0–36.0)
MCV: 90.6 fL (ref 80.0–100.0)
Monocytes Absolute: 0.5 10*3/uL (ref 0.1–1.0)
Monocytes Relative: 8 %
Neutro Abs: 4.5 10*3/uL (ref 1.7–7.7)
Neutrophils Relative %: 70 %
Platelets: 228 10*3/uL (ref 150–400)
RBC: 3.09 MIL/uL — ABNORMAL LOW (ref 3.87–5.11)
RDW: 14 % (ref 11.5–15.5)
WBC: 6.5 10*3/uL (ref 4.0–10.5)
nRBC: 0 % (ref 0.0–0.2)

## 2023-07-08 LAB — TROPONIN T, HIGH SENSITIVITY
Troponin T High Sensitivity: <15 ng/L (ref <19)
Troponin T High Sensitivity: <15 ng/L (ref <19)

## 2023-07-08 MED ORDER — OXYCODONE-ACETAMINOPHEN 5-325 MG PO TABS
1.0000 | ORAL_TABLET | Freq: Once | ORAL | Status: AC
  Administered 2023-07-08: 1 via ORAL
  Filled 2023-07-08: qty 1

## 2023-07-08 MED ORDER — CARVEDILOL 12.5 MG PO TABS
12.5000 mg | ORAL_TABLET | Freq: Two times a day (BID) | ORAL | Status: DC
  Administered 2023-07-08: 12.5 mg via ORAL
  Filled 2023-07-08: qty 1

## 2023-07-08 NOTE — Discharge Instructions (Signed)
 Testing is reassuring.  No evidence of serious traumatic injury.  Take your medications as prescribed and follow-up with your doctor.  Return to the ED with new or worsening symptoms.

## 2023-07-08 NOTE — ED Notes (Signed)
 Apple juice given.

## 2023-07-08 NOTE — ED Notes (Signed)
 EDP at [facility]
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
## 2023-07-14 ENCOUNTER — Other Ambulatory Visit (INDEPENDENT_AMBULATORY_CARE_PROVIDER_SITE_OTHER): Payer: Self-pay

## 2023-07-14 ENCOUNTER — Ambulatory Visit: Admitting: Orthopedic Surgery

## 2023-07-14 ENCOUNTER — Encounter: Payer: Self-pay | Admitting: Orthopedic Surgery

## 2023-07-14 DIAGNOSIS — M545 Low back pain, unspecified: Secondary | ICD-10-CM | POA: Diagnosis not present

## 2023-07-14 DIAGNOSIS — G8929 Other chronic pain: Secondary | ICD-10-CM | POA: Diagnosis not present

## 2023-07-14 MED ORDER — METHOCARBAMOL 500 MG PO TABS: 500.0000 mg | ORAL_TABLET | Freq: Three times a day (TID) | ORAL | 0 refills | Status: AC | PRN
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
## 2023-07-29 ENCOUNTER — Emergency Department (HOSPITAL_BASED_OUTPATIENT_CLINIC_OR_DEPARTMENT_OTHER)
Admission: EM | Admit: 2023-07-29 | Discharge: 2023-07-29 | Disposition: A | Discharge status: Home / Self Care | Attending: Emergency Medicine | Admitting: Emergency Medicine

## 2023-07-29 ENCOUNTER — Encounter (HOSPITAL_BASED_OUTPATIENT_CLINIC_OR_DEPARTMENT_OTHER): Payer: Self-pay | Admitting: Emergency Medicine

## 2023-07-29 ENCOUNTER — Other Ambulatory Visit: Payer: Self-pay

## 2023-07-29 DIAGNOSIS — E1122 Type 2 diabetes mellitus with diabetic chronic kidney disease: Secondary | ICD-10-CM | POA: Diagnosis not present

## 2023-07-29 DIAGNOSIS — Z794 Long term (current) use of insulin: Secondary | ICD-10-CM | POA: Diagnosis not present

## 2023-07-29 DIAGNOSIS — Z940 Kidney transplant status: Secondary | ICD-10-CM | POA: Diagnosis not present

## 2023-07-29 DIAGNOSIS — Z7901 Long term (current) use of anticoagulants: Secondary | ICD-10-CM | POA: Diagnosis not present

## 2023-07-29 DIAGNOSIS — Z79899 Other long term (current) drug therapy: Secondary | ICD-10-CM | POA: Diagnosis not present

## 2023-07-29 DIAGNOSIS — N186 End stage renal disease: Secondary | ICD-10-CM | POA: Insufficient documentation

## 2023-07-29 DIAGNOSIS — I120 Hypertensive chronic kidney disease with stage 5 chronic kidney disease or end stage renal disease: Secondary | ICD-10-CM | POA: Diagnosis not present

## 2023-07-29 DIAGNOSIS — N179 Acute kidney failure, unspecified: Secondary | ICD-10-CM

## 2023-07-29 DIAGNOSIS — E860 Dehydration: Secondary | ICD-10-CM | POA: Insufficient documentation

## 2023-07-29 LAB — COMPREHENSIVE METABOLIC PANEL WITH GFR
ALT: 7 U/L (ref 0–44)
AST: 15 U/L (ref 15–41)
Albumin: 3.9 g/dL (ref 3.5–5.0)
Alkaline Phosphatase: 75 U/L (ref 38–126)
Anion gap: 14 (ref 5–15)
BUN: 52 mg/dL — ABNORMAL HIGH (ref 6–20)
CO2: 14 mmol/L — ABNORMAL LOW (ref 22–32)
Calcium: 8.2 mg/dL — ABNORMAL LOW (ref 8.9–10.3)
Chloride: 107 mmol/L (ref 98–111)
Creatinine, Ser: 4.84 mg/dL — ABNORMAL HIGH (ref 0.44–1.00)
GFR, Estimated: 11 mL/min — ABNORMAL LOW (ref >60)
Glucose, Bld: 275 mg/dL — ABNORMAL HIGH (ref 70–99)
Potassium: 5.3 mmol/L — ABNORMAL HIGH (ref 3.5–5.1)
Sodium: 135 mmol/L (ref 135–145)
Total Bilirubin: 0.4 mg/dL (ref 0.0–1.2)
Total Protein: 8.6 g/dL — ABNORMAL HIGH (ref 6.5–8.1)

## 2023-07-29 LAB — CBC
HCT: 26.1 % — ABNORMAL LOW (ref 36.0–46.0)
Hemoglobin: 8.5 g/dL — ABNORMAL LOW (ref 12.0–15.0)
MCH: 29.3 pg (ref 26.0–34.0)
MCHC: 32.6 g/dL (ref 30.0–36.0)
MCV: 90 fL (ref 80.0–100.0)
Platelets: 236 10*3/uL (ref 150–400)
RBC: 2.9 MIL/uL — ABNORMAL LOW (ref 3.87–5.11)
RDW: 14.1 % (ref 11.5–15.5)
WBC: 5.9 10*3/uL (ref 4.0–10.5)
nRBC: 0 % (ref 0.0–0.2)

## 2023-07-29 LAB — CBG MONITORING, ED: Glucose-Capillary: 253 mg/dL — ABNORMAL HIGH (ref 70–99)

## 2023-07-29 MED ORDER — SODIUM CHLORIDE 0.9 % IV BOLUS
1000.0000 mL | Freq: Once | INTRAVENOUS | Status: AC
  Administered 2023-07-29: 1000 mL via INTRAVENOUS

## 2023-07-29 NOTE — ED Notes (Signed)
 Multiple attempts to insert US  guided PIV. Pt will only allow use of her lower arm and hand. Unable to obtain IV. Delo, MD made aware.

## 2023-07-29 NOTE — ED Provider Notes (Signed)
 [place] EMERGENCY DEPARTMENT AT [facility] Provider Note   CSN: [id number] Arrival date & time: 07/29/23  0014     History  Chief Complaint  Patient presents with   Dehydration    [name] is a 38 y.o. female.  Patient is a 38 year old female presenting at the request of her primary doctor due to abnormal blood work.  Patient has history of end-stage renal disease and is s/p renal transplant in December 2024.  She also has history of diabetes, hypertension, and GERD.  She had blood work performed yesterday by her primary doctor and was told her creatinine was elevated and she appeared dehydrated and that she should come to the ER for fluids.  Patient tells me that she feels fine otherwise.  She denies any nausea, vomiting, or diarrhea.  She denies any decreased appetite, fevers, or chills.       Home Medications Prior to Admission medications   Medication Sig Start Date End Date Taking? Authorizing Provider  calcitRIOL  (ROCALTROL ) 0.5 MCG capsule Take 0.5 mcg by mouth daily. 05/16/23   [provider]  Calcium  Carbonate Antacid (CALCIUM  CARBONATE, DOSED IN MG ELEMENTAL CALCIUM ,) 1250 MG/5ML SUSP Take 500 mg of elemental calcium  by mouth 3 (three) times daily. 05/15/23   [provider]  carvedilol  (COREG ) 12.5 MG tablet Take 12.5 mg by mouth 2 (two) times daily. 05/16/23   [provider]  conjugated estrogens (PREMARIN) vaginal cream Place 1 applicator vaginally daily as needed (for dryness). 05/23/23 05/22/24  [provider]  ELIQUIS  5 MG TABS tablet Take 5 mg by mouth 2 (two) times daily. 05/16/23   [provider]  famotidine  (PEPCID ) 20 MG tablet Take 1 tablet (20 mg total) by mouth 2 (two) times daily. 06/21/23   Mesner, Reymundo Caulk, MD  gabapentin  (NEURONTIN ) 300 MG capsule Take 300 mg by mouth at bedtime.    [provider]  hydrOXYzine  (ATARAX ) 25 MG tablet Take 25 mg by mouth daily as needed for itching. 05/16/23    [provider]  Insulin  Aspart FlexPen (NOVOLOG ) 100 UNIT/ML inject 4-10 units  under the skin with meals as directed.  Max 30 units per day 05/19/23   [provider]  insulin  degludec (TRESIBA ) 100 UNIT/ML FlexTouch Pen Inject 30 Units into the skin daily. 05/19/23 05/13/24  [provider]  methocarbamol  (ROBAXIN ) 500 MG tablet Take 1 tablet (500 mg total) by mouth every 8 (eight) hours as needed for muscle spasms. 07/14/23   Timothy Ford, MD  mycophenolate  (CELLCEPT ) 250 MG capsule Take 1 capsule (250 mg total) by mouth 2 (two) times daily. Please resume your home dose of 750 twice daily dose once you have finished your deficit treatment on 06/18/2023. 06/14/23   Singh, Prashant K, MD  NIFEdipine  (PROCARDIA -XL/NIFEDICAL-XL) 30 MG 24 hr tablet Take 30 mg by mouth daily. 05/19/23 05/18/24  [provider]  oxyCODONE  (OXY IR/ROXICODONE ) 5 MG immediate release tablet Take 1 tablet (5 mg total) by mouth every 6 (six) hours as needed for moderate pain (pain score 4-6) or breakthrough pain. 06/21/23   Mesner, Reymundo Caulk, MD  pravastatin  (PRAVACHOL ) 40 MG tablet Take 40 mg by mouth daily. 05/16/23   [provider]  predniSONE  (DELTASONE ) 5 MG tablet Take 5 mg by mouth daily with breakfast.    [provider]  promethazine  (PHENERGAN ) 25 MG suppository Place 1 suppository (25 mg total) rectally every 6 (six) hours as needed for nausea or vomiting. 06/21/23   Mesner, Reymundo Caulk,  MD  promethazine  (PHENERGAN ) 25 MG tablet Take 1 tablet (25 mg total) by mouth every 6 (six) hours as needed for nausea or vomiting. 06/21/23   Mesner, Reymundo Caulk, MD  sucralfate  (CARAFATE ) 1 g tablet Take 1 tablet (1 g total) by mouth 4 (four) times daily -  with meals and at bedtime for 15 days. 06/21/23 07/06/23  Mesner, Reymundo Caulk, MD  sulfamethoxazole -trimethoprim  (BACTRIM ) 400-80 MG tablet Take 1 tablet by mouth 3 (three) times a week. Monday, Wednesday and Fridays 05/16/23   [provider]  tacrolimus   (PROGRAF ) 5 MG capsule Take 1 capsule (5 mg total) by mouth 2 (two) times daily. 06/10/23   Doroteo Gasmen, MD  valGANciclovir  (VALCYTE ) 450 MG tablet Take 1 tablet (450 mg total) by mouth daily. 06/11/23   Doroteo Gasmen, MD      Allergies    Diphenhydramine, Peanut-containing drug products, Contrast media [iodinated contrast media], Shellfish allergy, Chlorhexidine , Ferrous sulfate, and Iron  dextran    Review of Systems   Review of Systems  All other systems reviewed and are negative.   Physical Exam Updated Vital Signs BP (!) 141/95   Pulse 82   Temp 97.8 F (36.6 C)   Resp 16   Ht 5\' 6"  (1.676 m)   Wt 81.6 kg   SpO2 99%   BMI 29.05 kg/m  Physical Exam Vitals and nursing note reviewed.  Constitutional:      General: She is not in acute distress.    Appearance: She is well-developed. She is not diaphoretic.  HENT:     Head: Normocephalic and atraumatic.  Cardiovascular:     Rate and Rhythm: Normal rate and regular rhythm.     Heart sounds: No murmur heard.    No friction rub. No gallop.  Pulmonary:     Effort: Pulmonary effort is normal. No respiratory distress.     Breath sounds: Normal breath sounds. No wheezing.  Abdominal:     General: Bowel sounds are normal. There is no distension.     Palpations: Abdomen is soft.     Tenderness: There is no abdominal tenderness.  Musculoskeletal:        General: Normal range of motion.     Cervical back: Normal range of motion and neck supple.  Skin:    General: Skin is warm and dry.  Neurological:     General: No focal deficit present.     Mental Status: She is alert and oriented to person, place, and time.     ED Results / Procedures / Treatments   Labs (all labs ordered are listed, but only abnormal results are displayed) Labs Reviewed  CBG MONITORING, ED - Abnormal; Notable for the following components:      Result Value   Glucose-Capillary 253 (*)    All other components within normal limits  CBC   COMPREHENSIVE METABOLIC PANEL WITH GFR  URINALYSIS, ROUTINE W REFLEX MICROSCOPIC    EKG None  Radiology No results found.  Procedures Procedures    Medications Ordered in ED Medications  sodium chloride  0.9 % bolus 1,000 mL (has no administration in time range)    ED Course/ Medical Decision Making/ A&P  Patient is a 38 year old female with extensive past medical history.  She had a renal transplant in December 2024 and is followed by nephrology/transplant team at [facility].  Patient apparently left [facility] AGAINST MEDICAL ADVICE just prior to coming here due to the lack of restroom facilities in her hospital room.  Patient arrives here  with stable vital signs and is afebrile.  She has no complaints otherwise.  Laboratory studies obtained including CBC and CMP.  BUN is 52 and creatinine is 4.8.  Hemoglobin is 8.5, but laboratory studies otherwise basically unremarkable.  Patient was hydrated with 2 L of normal saline and will be discharged to home at her request.  She just left [facility] an hour prior to coming here and does not want to be sent back.  She tells me she is just here for fluids and wants to go home after work.  I have advised her to follow-up with her nephrologist in the morning as her creatinine is concerning for possible rejection.  Final Clinical Impression(s) / ED Diagnoses Final diagnoses:  None    Rx / DC Orders ED Discharge Orders     None         Orvilla Blander, MD 07/29/23 [id number]

## 2023-07-29 NOTE — Discharge Instructions (Signed)
 Follow-up with your nephrologist this morning.

## 2023-07-29 NOTE — ED Notes (Signed)
 Pt's PCP contacted Pt after blood work resulted and advised Pt to go to the ED due to abnormal labs. Pt has no complaints at this time.

## 2023-07-29 NOTE — ED Triage Notes (Signed)
 Patient advised by PCP to come to ED for fluids for dehydration according to lab work done today. Kidney transplant patient of December 2024. Denies dizziness, chest pain, or shortness of breath.

## 2023-08-09 ENCOUNTER — Ambulatory Visit: Attending: Orthopedic Surgery

## 2023-08-09 ENCOUNTER — Other Ambulatory Visit: Payer: Self-pay

## 2023-08-09 DIAGNOSIS — M545 Low back pain, unspecified: Secondary | ICD-10-CM | POA: Diagnosis not present

## 2023-08-09 DIAGNOSIS — M25552 Pain in left hip: Secondary | ICD-10-CM | POA: Insufficient documentation

## 2023-08-09 DIAGNOSIS — M5459 Other low back pain: Secondary | ICD-10-CM | POA: Insufficient documentation

## 2023-08-09 DIAGNOSIS — G8929 Other chronic pain: Secondary | ICD-10-CM | POA: Insufficient documentation
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
## 2023-08-19 ENCOUNTER — Ambulatory Visit

## 2023-09-01 ENCOUNTER — Ambulatory Visit: Attending: Orthopedic Surgery

## 2023-09-01 DIAGNOSIS — M25552 Pain in left hip: Secondary | ICD-10-CM | POA: Insufficient documentation

## 2023-09-01 DIAGNOSIS — M5459 Other low back pain: Secondary | ICD-10-CM | POA: Insufficient documentation
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
## 2023-09-05 ENCOUNTER — Encounter: Payer: Self-pay | Admitting: Physical Therapy
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
## 2023-09-07 ENCOUNTER — Ambulatory Visit: Admitting: Internal Medicine
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
## 2023-09-14 ENCOUNTER — Encounter: Payer: Self-pay | Admitting: Physical Therapy

## 2023-09-14 ENCOUNTER — Ambulatory Visit: Payer: Self-pay | Attending: Orthopedic Surgery | Admitting: Physical Therapy

## 2023-09-14 DIAGNOSIS — M5459 Other low back pain: Secondary | ICD-10-CM | POA: Insufficient documentation

## 2023-09-14 DIAGNOSIS — M25552 Pain in left hip: Secondary | ICD-10-CM | POA: Diagnosis present
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
## 2023-09-21 ENCOUNTER — Encounter: Payer: Self-pay | Admitting: Physical Therapy

## 2023-09-21 ENCOUNTER — Ambulatory Visit: Payer: Self-pay | Admitting: Physical Therapy

## 2023-09-21 DIAGNOSIS — M5459 Other low back pain: Secondary | ICD-10-CM | POA: Diagnosis not present

## 2023-09-21 DIAGNOSIS — M25552 Pain in left hip: Secondary | ICD-10-CM

## 2023-09-28 NOTE — Progress Notes (Signed)
 [facility]  transplant nephrology clinic visit    assessment and plan  1. kidney transplant 03/03/2023. baseline creatinine 2s mg/dl post treatment for acute rejection; unfortunately worsening now at 2.6, which may be due to ongoing rejection and dehydration. stage iv chronic kidney disease. no albuminuria. +donor specific hla class2 ab detected.  2. Chronic antibody mediated rejection, acute T cell mediated rejection (last renal bx 09/15/2023)- Plan for hospitalization next week for steroids x3 days and IVIG x2 days.  Ritux panel pending.   3.  immunosuppression. mycophenolate  750mg  bid. prednisone  5mg  daily, tacrolimus  7mg /6mg  am/pm; 12hr lvl 6-10 ng/ml.  4.  hypertension. blood pressure goal < 130/80 mmhg. On carvedilol  , holding nifedipine  due to low blood pressure, plan for 1L of fluids today   5. hyperlipidemia. pravastatin  40mg  daily.  6. diabetes mellitus2. tresiba  insulin  45u bid.  7. left lower ext dvt. apixaban  5mg  bid.  8. preventive medicine. trimethoprim  sulfamethoxazole  80/400mg  3x/week x65m 12/24-6/25.  9. B/l fistula pain- difficulty with completing work obligations at [employer]. Not interested in going to [facility] at this time.     history of present illness    ms [name] is a 38 year old woman seen in follow up post kidney transplant 03/03/2023.     Last seen 08/2023    Gastroparesis S/p  G- poem 7/10   Black tarry stools since procedure, today first day BM was brown   Saw GI post procedure 7/15 she had emesis during appt  Eating broccoli cheddar soup from [employer] twice a day since she is only able to have soup  EGD pending 11/03/2023    BP at home around 110/ ?, previously high at home   Hold nifedipine  when BP around 110  Takes carvedilol  every day     Previously spend a lot of time with her mother   Who in Oct 18, 2016 passed away in her sleep from brain aneurysm    IS meds no problems  Some tremors  Tac 7/6 10 pm last dose    Cold chills wore sweater to the appt     Denies Heart palp , CP, SOB,     Difficulty with lifting things pain at fistula site   At [facility]   She would like to leave her job     Follow up biopsy with some ongoing inflammation -> will give steroids x3, and IVIG 2 days    past medical hx:  1. deceased donor kidney transplant 07/01/2005. [facility]. 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 graft failure d/t rejection. transplant nephrectomy 10/18/2023.  2. deceased donor/kdpi 41% kidney transplant 03/03/2023. alemtuzumab induction. baseline creatinine 1.4-1.6 mg/dl.  3. diabetes mellitus2 +dm retinopathy, peripheral neuropathy incl dm gastroparesis  4. hypertension  5. hyperlipidemia  6. xxx syndrome  7. left common femoral dvt/catheter associated '24  8. gastroesophageal reflux disease. +reflux esophagitis. +hiatal hernia. hx esophageal stricture requiring dilation; most recent 09/24.    past surgical hx: peritoneal dialysis catheter 1994/10/19, bilateral upper ext av graft/fistula placement, parathyroidectomy, kidney transplant '07, laparoscopic cholecystectomy '19, transplant nephrectomy 2023/10/18, left 1st toe resection '24, kidney transplant '24.     allergies: diphenhydramine  shellfish chlorhexidine iron  sulfate iodine contrast media     medications: tacrolimus  7/6mg , mycophenolate  750 mg bid, prednisone  5mg  daily, carvedilol  12.5mg  bid, pravastatin  40mg  daily, aspirin  81mg  daily, tresiba  insulin  45u daily, aspart insulin  with meals, valganciclovir  450mg  daily, trimethoprim  sulfamethoxazole  80/400mg  3x/week, calcitriol  0.5mcg daily, enoxaparin  40mg  bid, mg oxide 266mg  bid, gabapentin  300mg   q.pm.    soc hx: single no children. no smoking hx    physical exam: t98.1 p109 bp107/72 wt80.3 kg bmi 28.6. wd/wn woman appropriate affect and mood. sclera anicteric. mmm no thrush. neck supple no palpable ln. heart rrr nl s1s2 no m/r/g. lungs clear bilateral. abd soft nt/nd left lower quadrant incision; no drainage or erythema. no lower ext edema. msk no synovitis or tophi. skin no rash. neuro alert oriented non focal exam.    Patient was seen and discussed with attending nephrologist Dr. Melba. Please feel free to reach out with any questions, concerns, or changes in patient condition. Thank you for the consult.     Altamese Hill  PGY5 Nephrology Fellow   Department of Nephrology and Hypertension

## 2023-09-29 ENCOUNTER — Ambulatory Visit: Payer: Self-pay | Admitting: Physical Therapy

## 2023-09-29 ENCOUNTER — Other Ambulatory Visit: Payer: Self-pay

## 2023-09-29 ENCOUNTER — Emergency Department (HOSPITAL_BASED_OUTPATIENT_CLINIC_OR_DEPARTMENT_OTHER)
Admission: EM | Admit: 2023-09-29 | Discharge: 2023-09-30 | Disposition: A | Discharge status: Home / Self Care | Attending: Emergency Medicine | Admitting: Emergency Medicine

## 2023-09-29 ENCOUNTER — Encounter (HOSPITAL_BASED_OUTPATIENT_CLINIC_OR_DEPARTMENT_OTHER): Payer: Self-pay

## 2023-09-29 ENCOUNTER — Emergency Department (HOSPITAL_BASED_OUTPATIENT_CLINIC_OR_DEPARTMENT_OTHER)

## 2023-09-29 DIAGNOSIS — Z79899 Other long term (current) drug therapy: Secondary | ICD-10-CM | POA: Insufficient documentation

## 2023-09-29 DIAGNOSIS — I129 Hypertensive chronic kidney disease with stage 1 through stage 4 chronic kidney disease, or unspecified chronic kidney disease: Secondary | ICD-10-CM | POA: Insufficient documentation

## 2023-09-29 DIAGNOSIS — Z794 Long term (current) use of insulin: Secondary | ICD-10-CM | POA: Insufficient documentation

## 2023-09-29 DIAGNOSIS — R197 Diarrhea, unspecified: Secondary | ICD-10-CM | POA: Diagnosis not present

## 2023-09-29 DIAGNOSIS — E1122 Type 2 diabetes mellitus with diabetic chronic kidney disease: Secondary | ICD-10-CM | POA: Insufficient documentation

## 2023-09-29 DIAGNOSIS — Z7901 Long term (current) use of anticoagulants: Secondary | ICD-10-CM | POA: Insufficient documentation

## 2023-09-29 DIAGNOSIS — R112 Nausea with vomiting, unspecified: Secondary | ICD-10-CM | POA: Diagnosis not present

## 2023-09-29 DIAGNOSIS — N189 Chronic kidney disease, unspecified: Secondary | ICD-10-CM | POA: Insufficient documentation

## 2023-09-29 DIAGNOSIS — R1013 Epigastric pain: Secondary | ICD-10-CM | POA: Insufficient documentation

## 2023-09-29 DIAGNOSIS — R109 Unspecified abdominal pain: Secondary | ICD-10-CM | POA: Diagnosis present

## 2023-09-29 LAB — CBC WITH DIFFERENTIAL/PLATELET
Abs Immature Granulocytes: 0.04 K/uL (ref 0.00–0.07)
Basophils Absolute: 0 K/uL (ref 0.0–0.1)
Basophils Relative: 0 %
Eosinophils Absolute: 0.1 K/uL (ref 0.0–0.5)
Eosinophils Relative: 1 %
HCT: 26.8 % — ABNORMAL LOW (ref 36.0–46.0)
Hemoglobin: 8.5 g/dL — ABNORMAL LOW (ref 12.0–15.0)
Immature Granulocytes: 0 %
Lymphocytes Relative: 2 %
Lymphs Abs: 0.2 K/uL — ABNORMAL LOW (ref 0.7–4.0)
MCH: 28.5 pg (ref 26.0–34.0)
MCHC: 31.7 g/dL (ref 30.0–36.0)
MCV: 89.9 fL (ref 80.0–100.0)
Monocytes Absolute: 1.1 K/uL — ABNORMAL HIGH (ref 0.1–1.0)
Monocytes Relative: 12 %
Neutro Abs: 8 K/uL — ABNORMAL HIGH (ref 1.7–7.7)
Neutrophils Relative %: 85 %
Platelets: 165 K/uL (ref 150–400)
RBC: 2.98 MIL/uL — ABNORMAL LOW (ref 3.87–5.11)
RDW: 14.5 % (ref 11.5–15.5)
WBC: 9.4 K/uL (ref 4.0–10.5)
nRBC: 0 % (ref 0.0–0.2)

## 2023-09-29 LAB — COMPREHENSIVE METABOLIC PANEL WITH GFR
ALT: 8 U/L (ref 0–44)
AST: 12 U/L — ABNORMAL LOW (ref 15–41)
Albumin: 4.2 g/dL (ref 3.5–5.0)
Alkaline Phosphatase: 61 U/L (ref 38–126)
Anion gap: 16 — ABNORMAL HIGH (ref 5–15)
BUN: 36 mg/dL — ABNORMAL HIGH (ref 6–20)
CO2: 18 mmol/L — ABNORMAL LOW (ref 22–32)
Calcium: 7.2 mg/dL — ABNORMAL LOW (ref 8.9–10.3)
Chloride: 99 mmol/L (ref 98–111)
Creatinine, Ser: 3.47 mg/dL — ABNORMAL HIGH (ref 0.44–1.00)
GFR, Estimated: 16 mL/min — ABNORMAL LOW (ref >60)
Glucose, Bld: 381 mg/dL — ABNORMAL HIGH (ref 70–99)
Potassium: 4 mmol/L (ref 3.5–5.1)
Sodium: 132 mmol/L — ABNORMAL LOW (ref 135–145)
Total Bilirubin: 0.5 mg/dL (ref 0.0–1.2)
Total Protein: 8.5 g/dL — ABNORMAL HIGH (ref 6.5–8.1)

## 2023-09-29 LAB — URINALYSIS, W/ REFLEX TO CULTURE (INFECTION SUSPECTED)
Bilirubin Urine: NEGATIVE
Glucose, UA: >1000 mg/dL — AB
Ketones, ur: NEGATIVE mg/dL
Nitrite: NEGATIVE
Protein, ur: 30 mg/dL — AB
Specific Gravity, Urine: 1.016 (ref 1.005–1.030)
pH: 5.5 (ref 5.0–8.0)

## 2023-09-29 LAB — LACTIC ACID, PLASMA: Lactic Acid, Venous: 0.8 mmol/L (ref 0.5–1.9)

## 2023-09-29 LAB — I-STAT VENOUS BLOOD GAS, ED
Acid-base deficit: 6 mmol/L — ABNORMAL HIGH (ref 0.0–2.0)
Bicarbonate: 19.7 mmol/L — ABNORMAL LOW (ref 20.0–28.0)
Calcium, Ion: 0.95 mmol/L — ABNORMAL LOW (ref 1.15–1.40)
HCT: 38 % (ref 36.0–46.0)
Hemoglobin: 12.9 g/dL (ref 12.0–15.0)
O2 Saturation: 26 %
Potassium: 4 mmol/L (ref 3.5–5.1)
Sodium: 136 mmol/L (ref 135–145)
TCO2: 21 mmol/L — ABNORMAL LOW (ref 22–32)
pCO2, Ven: 39.6 mmHg — ABNORMAL LOW (ref 44–60)
pH, Ven: 7.305 (ref 7.25–7.43)
pO2, Ven: 19 mmHg — CL (ref 32–45)

## 2023-09-29 LAB — LIPASE, BLOOD: Lipase: 26 U/L (ref 11–51)

## 2023-09-29 LAB — OCCULT BLOOD X 1 CARD TO LAB, STOOL: Fecal Occult Bld: NEGATIVE

## 2023-09-29 LAB — HCG, QUANTITATIVE, PREGNANCY: hCG, Beta Chain, Quant, S: 9 m[IU]/mL — ABNORMAL HIGH (ref <5)

## 2023-09-29 MED ORDER — ONDANSETRON HCL 4 MG/2ML IJ SOLN
4.0000 mg | Freq: Once | INTRAMUSCULAR | Status: AC
  Administered 2023-09-29: 4 mg via INTRAVENOUS
  Filled 2023-09-29: qty 2

## 2023-09-29 MED ORDER — SODIUM CHLORIDE 0.9 % IV BOLUS
1000.0000 mL | Freq: Once | INTRAVENOUS | Status: AC
  Administered 2023-09-29: 1000 mL via INTRAVENOUS

## 2023-09-29 MED ORDER — MORPHINE SULFATE (PF) 4 MG/ML IV SOLN
4.0000 mg | Freq: Once | INTRAVENOUS | Status: AC
  Administered 2023-09-29: 4 mg via INTRAVENOUS
  Filled 2023-09-29: qty 1

## 2023-09-29 MED ORDER — FENTANYL CITRATE PF 50 MCG/ML IJ SOSY
50.0000 ug | PREFILLED_SYRINGE | Freq: Once | INTRAMUSCULAR | Status: AC
  Administered 2023-09-29: 50 ug via INTRAVENOUS
  Filled 2023-09-29: qty 1

## 2023-09-29 MED ORDER — FENTANYL CITRATE PF 50 MCG/ML IJ SOSY
25.0000 ug | PREFILLED_SYRINGE | Freq: Once | INTRAMUSCULAR | Status: AC
  Administered 2023-09-29: 25 ug via INTRAVENOUS
  Filled 2023-09-29: qty 1

## 2023-09-29 MED ORDER — OXYCODONE HCL 5 MG PO TABS: 5.0000 mg | ORAL_TABLET | ORAL | 0 refills | Status: DC | PRN

## 2023-09-29 MED ORDER — HYDROMORPHONE HCL 1 MG/ML IJ SOLN
0.5000 mg | Freq: Once | INTRAMUSCULAR | Status: AC
  Administered 2023-09-29: 0.5 mg via INTRAVENOUS
  Filled 2023-09-29: qty 1

## 2023-09-29 NOTE — ED Provider Notes (Signed)
 [place] EMERGENCY DEPARTMENT AT [facility] Provider Note   CSN: [id number] Arrival date & time: 09/29/23  1431     Patient presents with: Post-op Problem   [name] is a 38 y.o. female with PMHx kidney transplant on immunosuppressive medications, anemia, CKD, DM, GERD, HLD, HTN, who presents to ED concerned for melena, abdominal pain, nausea, vomiting, and generalized weakness progressing over the past week since having G-POEM procedure last week. Patient also with diarrhea since the surgery. Vomiting started 2 days ago - denies hematemesis. Patient stating that she cannot keep her medications down. Denies dysuria, hematuria.   Patient currently on anti-rejection medications for kidney transplant in 02/2023 followed by [facility] nephrology team - patient is currently rejecting kidney and will be admitted next week for IV steroids. Patient with G-POEM surgery on 7/10 performed by [facility] gastroenterologist.   HPI     Prior to Admission medications   Medication Sig Start Date End Date Taking? Authorizing Provider  esomeprazole (NEXIUM) 40 MG capsule Take 40 mg by mouth 2 (two) times daily before a meal. 09/27/23  Yes [provider]  oxyCODONE  (ROXICODONE ) 5 MG immediate release tablet Take 1 tablet (5 mg total) by mouth every 4 (four) hours as needed for up to 2 doses for severe pain (pain score 7-10). 09/29/23  Yes Hoy Fraction F, PA-C  calcitRIOL  (ROCALTROL ) 0.5 MCG capsule Take 0.5 mcg by mouth daily. 05/16/23   [provider]  Calcium  Carbonate Antacid (CALCIUM  CARBONATE, DOSED IN MG ELEMENTAL CALCIUM ,) 1250 MG/5ML SUSP Take 500 mg of elemental calcium  by mouth 3 (three) times daily. 05/15/23   [provider]  carvedilol  (COREG ) 12.5 MG tablet Take 12.5 mg by mouth 2 (two) times daily. 05/16/23   [provider]  conjugated estrogens (PREMARIN) vaginal cream Place 1 applicator vaginally daily as needed (for dryness). 05/23/23 05/22/24   [provider]  ELIQUIS  5 MG TABS tablet Take 5 mg by mouth 2 (two) times daily. Patient not taking: Reported on 08/09/2023 05/16/23   [provider]  famotidine  (PEPCID ) 20 MG tablet Take 1 tablet (20 mg total) by mouth 2 (two) times daily. 06/21/23   Mesner, Selinda, MD  gabapentin  (NEURONTIN ) 300 MG capsule Take 300 mg by mouth at bedtime.    [provider]  hydrOXYzine  (ATARAX ) 25 MG tablet Take 25 mg by mouth daily as needed for itching. 05/16/23   [provider]  Insulin  Aspart FlexPen (NOVOLOG ) 100 UNIT/ML inject 4-10 units  under the skin with meals as directed.  Max 30 units per day 05/19/23   [provider]  insulin  degludec (TRESIBA ) 100 UNIT/ML FlexTouch Pen Inject 30 Units into the skin daily. 05/19/23 05/13/24  [provider]  methocarbamol  (ROBAXIN ) 500 MG tablet Take 1 tablet (500 mg total) by mouth every 8 (eight) hours as needed for muscle spasms. 07/14/23   Harden Jerona GAILS, MD  mycophenolate  (CELLCEPT ) 250 MG capsule Take 1 capsule (250 mg total) by mouth 2 (two) times daily. Please resume your home dose of 750 twice daily dose once you have finished your deficit treatment on 06/18/2023. 06/14/23   Singh, Prashant K, MD  NIFEdipine  (PROCARDIA -XL/NIFEDICAL-XL) 30 MG 24 hr tablet Take 30 mg by mouth daily. Patient not taking: Reported on 08/09/2023 05/19/23 05/18/24  [provider]  pravastatin  (PRAVACHOL ) 40 MG tablet Take 40 mg by mouth daily. 05/16/23   [provider]  predniSONE  (DELTASONE ) 5 MG tablet Take 5 mg by mouth daily with breakfast.  [provider]  promethazine  (PHENERGAN ) 25 MG suppository Place 1 suppository (25 mg total) rectally every 6 (six) hours as needed for nausea or vomiting. Patient not taking: Reported on 08/09/2023 06/21/23   Mesner, Selinda, MD  promethazine  (PHENERGAN ) 25 MG tablet Take 1 tablet (25 mg total) by mouth every 6 (six) hours as needed for nausea or vomiting. Patient not taking:  Reported on 08/09/2023 06/21/23   Mesner, Selinda, MD  sucralfate  (CARAFATE ) 1 g tablet Take 1 tablet (1 g total) by mouth 4 (four) times daily -  with meals and at bedtime for 15 days. 06/21/23 07/06/23  Mesner, Selinda, MD  sulfamethoxazole -trimethoprim  (BACTRIM ) 400-80 MG tablet Take 1 tablet by mouth 3 (three) times a week. Monday, Wednesday and Fridays 05/16/23   [provider]  tacrolimus  (PROGRAF ) 5 MG capsule Take 1 capsule (5 mg total) by mouth 2 (two) times daily. 06/10/23   Rosario Leatrice FERNS, MD  valGANciclovir  (VALCYTE ) 450 MG tablet Take 1 tablet (450 mg total) by mouth daily. Patient not taking: Reported on 08/09/2023 06/11/23   Rosario Leatrice I, MD    Allergies: Diphenhydramine, Peanut-containing drug products, Contrast media [iodinated contrast media], Shellfish allergy, Chlorhexidine , Ferrous sulfate, and Iron  dextran    Review of Systems  Gastrointestinal:  Positive for abdominal pain.    Updated Vital Signs BP (!) 148/94   Pulse 99   Temp 99.7 F (37.6 C) (Oral)   Resp 13   SpO2 100%   Physical Exam Vitals and nursing note reviewed.  Constitutional:      General: She is not in acute distress.    Appearance: She is not ill-appearing or toxic-appearing.  HENT:     Head: Normocephalic and atraumatic.     Mouth/Throat:     Mouth: Mucous membranes are moist.  Eyes:     General: No scleral icterus.       Right eye: No discharge.        Left eye: No discharge.     Conjunctiva/sclera: Conjunctivae normal.  Cardiovascular:     Rate and Rhythm: Regular rhythm. Tachycardia present.     Pulses: Normal pulses.     Heart sounds: Normal heart sounds. No murmur heard. Pulmonary:     Effort: Pulmonary effort is normal. No respiratory distress.     Breath sounds: Normal breath sounds. No wheezing, rhonchi or rales.  Abdominal:     General: Abdomen is flat. Bowel sounds are normal. There is no distension.     Palpations: Abdomen is soft. There is no mass.     Tenderness:  There is abdominal tenderness.     Comments: Epigastric tenderness to palpation  Musculoskeletal:     Right lower leg: No edema.     Left lower leg: No edema.  Skin:    General: Skin is warm and dry.     Findings: No rash.  Neurological:     General: No focal deficit present.     Mental Status: She is alert and oriented to person, place, and time. Mental status is at baseline.  Psychiatric:        Mood and Affect: Mood normal.        Behavior: Behavior normal.     (all labs ordered are listed, but only abnormal results are displayed) Labs Reviewed  COMPREHENSIVE METABOLIC PANEL WITH GFR - Abnormal; Notable for the following components:      Result Value   Sodium 132 (*)    CO2 18 (*)    Glucose,  Bld 381 (*)    BUN 36 (*)    Creatinine, Ser 3.47 (*)    Calcium  7.2 (*)    Total Protein 8.5 (*)    AST 12 (*)    GFR, Estimated 16 (*)    Anion gap 16 (*)    All other components within normal limits  CBC WITH DIFFERENTIAL/PLATELET - Abnormal; Notable for the following components:   RBC 2.98 (*)    Hemoglobin 8.5 (*)    HCT 26.8 (*)    Neutro Abs 8.0 (*)    Lymphs Abs 0.2 (*)    Monocytes Absolute 1.1 (*)    All other components within normal limits  URINALYSIS, W/ REFLEX TO CULTURE (INFECTION SUSPECTED) - Abnormal; Notable for the following components:   APPearance HAZY (*)    Glucose, UA >1,000 (*)    Hgb urine dipstick TRACE (*)    Protein, ur 30 (*)    Leukocytes,Ua TRACE (*)    Bacteria, UA RARE (*)    Crystals PRESENT (*)    All other components within normal limits  HCG, QUANTITATIVE, PREGNANCY - Abnormal; Notable for the following components:   hCG, Beta Chain, Quant, S 9 (*)    All other components within normal limits  I-STAT VENOUS BLOOD GAS, ED - Abnormal; Notable for the following components:   pCO2, Ven 39.6 (*)    pO2, Ven 19 (*)    Bicarbonate 19.7 (*)    TCO2 21 (*)    Acid-base deficit 6.0 (*)    Calcium , Ion 0.95 (*)    All other components  within normal limits  CULTURE, BLOOD (ROUTINE X 2)  URINE CULTURE  LACTIC ACID, PLASMA  LIPASE, BLOOD  OCCULT BLOOD X 1 CARD TO LAB, STOOL  POC OCCULT BLOOD, ED  I-STAT CHEM 8, ED    EKG: EKG Interpretation Date/Time:  Thursday September 29 2023 15:46:36 EDT Ventricular Rate:  105 PR Interval:  131 QRS Duration:  83 QT Interval:  333 QTC Calculation: 441 R Axis:   60  Text Interpretation: Sinus tachycardia SINCE LAST TRACING HEART RATE HAS INCREASED Confirmed by Lenor Hollering [phone]) on 09/29/2023 7:05:48 PM  Radiology: CT ABDOMEN PELVIS WO CONTRAST Result Date: 09/29/2023 EXAM: CT ABDOMEN AND PELVIS WITHOUT CONTRAST 09/29/2023 06:02:32 PM TECHNIQUE: CT of the abdomen and pelvis was performed without the administration of intravenous contrast. Multiplanar reformatted images are provided for review. Automated exposure control, iterative reconstruction, and/or weight based adjustment of the mA/kV was utilized to reduce the radiation dose to as low as reasonably achievable. COMPARISON: CT dated 07/08/2023. CLINICAL HISTORY: Abdominal pain, post-op. Pt c/o black stool, ongoing since having G POEM procedure last week. Endorses ongoing stomach pain, nausea, and increasing weakness. FINDINGS: LOWER CHEST: No acute abnormality. LIVER: The liver is unremarkable. GALLBLADDER AND BILE DUCTS: Cholecystectomy. SPLEEN: No acute abnormality. PANCREAS: No acute abnormality. ADRENAL GLANDS: No acute abnormality. KIDNEYS, URETERS AND BLADDER: Right kidney abscent. Left kidney atrophic. Left lower quadrant renal transplant. Mild unchanged hydronephrosis in the transplant kidney. No urinary calculi. GI AND BOWEL: Metallic clips in the gastric antrum, possibly related to recent procedure, new from 07/08/2023. No bowel obstruction. Normal appendix. PERITONEUM AND RETROPERITONEUM: No ascites. No free air. VASCULATURE: Aorta is normal in caliber. LYMPH NODES: No lymphadenopathy. REPRODUCTIVE ORGANS: Hysterectomy. BONES  AND SOFT TISSUES: The subcutaneous fluid collection along the anterior left abdominal wall has nearly resolved. Residual trace soft tissue thickening in this area (series 2, image 55). No acute osseous abnormality. IMPRESSION: 1.  No acute findings in the abdomen or pelvis. 2. Metallic clips in the gastric antrum, possibly related to recent procedure, new from July 08, 2023. 3. Left lower quadrant renal transplant with stable mild hydronephrosis. No urinary calculi. Electronically signed by: Norman Gatlin MD 09/29/2023 06:24 PM EDT RP Workstation: [id number]     Procedures   Medications Ordered in the ED  fentaNYL  (SUBLIMAZE ) injection 50 mcg (has no administration in time range)  sodium chloride  0.9 % bolus 1,000 mL (0 mLs Intravenous Stopped 09/29/23 1635)  morphine  (PF) 4 MG/ML injection 4 mg (4 mg Intravenous Given 09/29/23 1635)  ondansetron  (ZOFRAN ) injection 4 mg (4 mg Intravenous Given 09/29/23 1634)  morphine  (PF) 4 MG/ML injection 4 mg (4 mg Intravenous Given 09/29/23 1810)  HYDROmorphone  (DILAUDID ) injection 0.5 mg (0.5 mg Intravenous Given 09/29/23 1924)  sodium chloride  0.9 % bolus 1,000 mL (1,000 mLs Intravenous New Bag/Given 09/29/23 2103)  fentaNYL  (SUBLIMAZE ) injection 25 mcg (25 mcg Intravenous Given 09/29/23 2131)                                    Medical Decision Making Amount and/or Complexity of Data Reviewed Labs: ordered. Radiology: ordered.  Risk Prescription drug management.    This patient presents to the ED for concern of abdominal pain, this involves an extensive number of treatment options, and is a complaint that carries with it a high risk of complications and morbidity.  The differential diagnosis includes gastroenteritis, colitis, small bowel obstruction, appendicitis, cholecystitis, pancreatitis, nephrolithiasis, UTI, pyelonephritis   Co morbidities that complicate the patient evaluation   kidney transplant on immunosuppressive medications, anemia,  CKD, DM, GERD, HLD, HTN   Additional history obtained:  Patient currently on anti-rejection medications for kidney transplant in 02/2023 followed by [facility] nephrology team - patient is currently rejecting kidney and will be admitted next week for IV steroids. Patient with G-POEM surgery on 7/10 performed by [facility] gastroenterologist.    Problem List / ED Course / Critical interventions / Medication management  Patient presented for abdominal pain, nausea, vomiting, diarrhea since GI surgery 7 days ago.  Physical exam with epigastric tenderness to palpation.  Patient also initially tachycardic in the low 100s which resolved with IV fluids.  Rest of physical exam reassuring. I Ordered, and personally interpreted labs.  Lactic acid within normal limits.  CBC without leukocytosis.  There is anemia with near patient's baseline currently with hemoglobin 8.5 today.  CMP with mild hyponatremia 132.  CO2 is also mildly low at 18.  BUN/creatinine improving since last values currently at 36/3.47.  Lipase within normal limits.  Blood cultures pending.  hCG chronically elevated at 9.  Occult blood stool negative. UA with contaminants and trace leukocytes. I ordered imaging studies including CT Abd/Pelvis: evaluate for structural/surgical etiology of patients' severe abdominal pain. I independently visualized and interpreted imaging and I agree with the radiologist interpretation of no acute process. I personally viewed and interpreted the EKG/cardiac monitored which showed an underlying rhythm of: sinus Tachycardia I requested consultation with the [facility] Gastroenterologist on-call Dr. Venetia Millicent Daring,  and discussed lab and imaging findings as well as pertinent plan - they do not see a reason for medical admission from their standpoint as patient's symptoms are similar to her symptoms 2 days prior during outpatient appointment. They agree to follow up with patient tomorrow.  Shared all results with patient.  Answered all questions. Patient feeling better and  tolerating PO now. Shared decision making with patient who would like to discuss urine culture results with nephrologist rather than starting ABX today because she does not want to exacerbate her GI symptoms and is not currently having any symptoms of UTI. Patient with follow up appointment with nephrologist in 2 days so I feel that this is appropriate. Patient's GI provider also agrees to follow up with her tomorrow. Patient understands to seek emergency care if experiencing any new or worsening symptoms. It also appears that patient's symptoms today are more related to her recent surgery.  Staffed with Dr. Lenor.  I have reviewed the patients home medicines and have made adjustments as needed The patient has been appropriately medically screened and/or stabilized in the ED. I have low suspicion for any other emergent medical condition which would require further screening, evaluation or treatment in the ED or require inpatient management. At time of discharge the patient is hemodynamically stable and in no acute distress. I have discussed work-up results and diagnosis with patient and answered all questions. Patient is agreeable with discharge plan. We discussed strict return precautions for returning to the emergency department and they verbalized understanding.    Social Determinants of Health:  none       Final diagnoses:  Epigastric pain  Nausea vomiting and diarrhea    ED Discharge Orders          Ordered    oxyCODONE  (ROXICODONE ) 5 MG immediate release tablet  Every 4 hours PRN        09/29/23 2336               Hoy Nidia FALCON, [place] 09/29/23 2337    Lenor Hollering, MD 09/30/23 1459

## 2023-09-29 NOTE — Discharge Instructions (Addendum)
 Please follow-up with your GI providers tomorrow.  Please also follow-up with your kidney doctors during your appointment in the next 2 days.  Seek emergency care if experiencing any new or worsening symptoms.

## 2023-09-29 NOTE — ED Notes (Signed)
Pt aware of the need for a urine... Pt unable to currently provide the sample.Marland KitchenMarland Kitchen

## 2023-09-29 NOTE — ED Notes (Signed)
 PT was desating and placed on 2 liters of O2

## 2023-09-29 NOTE — ED Notes (Signed)
 Pts O2 decreased to 86% after Dilaudid ... Pt placed on O2, 2LPM..SABRA O2 increased back to 98%... RRT and Provider aware.SABRASABRA

## 2023-09-29 NOTE — ED Triage Notes (Signed)
 Pt c/o black stool, ongoing since having G POEM procedure last week. Endorses ongoing stomach pain, nausea, and increasing weakness
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
## 2023-09-29 NOTE — ED Notes (Signed)
I-stat obtained by RRT

## 2023-10-03 LAB — URINE CULTURE: Culture: 80000 — AB

## 2023-10-04 ENCOUNTER — Telehealth (HOSPITAL_BASED_OUTPATIENT_CLINIC_OR_DEPARTMENT_OTHER): Payer: Self-pay

## 2023-10-04 LAB — CULTURE, BLOOD (ROUTINE X 2)
Culture: NO GROWTH
Special Requests: ADEQUATE

## 2023-10-04 NOTE — Telephone Encounter (Signed)
 Post ED Visit - Positive Culture Follow-up  Culture report reviewed by antimicrobial stewardship pharmacist: [facility] [x]  [place], [place].D. []  Venetia Gully, Pharm.D., BCPS AQ-ID []  Garrel Crews, Pharm.D., BCPS []  Almarie Lunger, [address].D., BCPS []  [place], [address].D., BCPS, AAHIVP []  Rosaline Bihari, Pharm.D., BCPS, AAHIVP []  Vernell Meier, PharmD, BCPS []  Latanya Hint, PharmD, BCPS []  Donald Medley, PharmD, BCPS []  Rocky Bold, PharmD []  Dorothyann Alert, PharmD, BCPS []  Morene Babe, PharmD  [facility] []  Rosaline Edison, PharmD []  Romona Bliss, PharmD []  Dolphus Roller, PharmD []  Veva Seip, Rph []  Vernell Daunt) Leonce, PharmD []  Eva Allis, PharmD []  Rosaline Millet, PharmD []  Iantha Batch, PharmD []  Arvin Gauss, PharmD []  Wanda Hasting, PharmD []  Ronal Rav, PharmD []  Rocky Slade, PharmD []  Bard Jeans, PharmD   Positive Urine culture Pt presents with abd pain s/p G-POEM and PMH of Kidney tx 12/24. Pt currently admitted at [facility].  no further patient follow-up is required at this time.  [name] 10/04/2023, 9:52 AM

## 2023-10-05 ENCOUNTER — Ambulatory Visit: Payer: Self-pay

## 2023-10-07 ENCOUNTER — Ambulatory Visit: Payer: Self-pay

## 2023-10-24 NOTE — Progress Notes (Signed)
 ------------------------------------------------------------------------------- Attestation signed by Jude Battiest, MD at 10/27/23 [phone] Attestation: I saw and evaluated the patient, participating in the key portions of the service.  I reviewed the fellow's note.  I agree with the fellow's findings and plan as documented in this note. Plan vaginal estrogen for recurrent UTI suppression. If refractory, consider antibiotic suppression with appropriate dose modification given renal dsyfunction. Reviewed difference between vulvar itching and true UTI symptoms, as their appears overlap. POCT without evidence of cystitis. Per IDSA guidelines recommend against screening/treatment asymptomatic bacteruria.   Battiest Jude MD  [facility] Urogynecol[facility] Re[facility]ive Pelvic Surgery -------------------------------------------------------------------------------  Referring Provider: Referred Self PCP: Shelda Aliene Ellen, MD  Chief Complaint:  Recurrent UTI  Subjective:   History of Present Illness: [name] is a 38 y.o. female who is seen in consultation at the request of Dr. Referred Self for evaluation of recurrent UTI's.    Was recently hospitalized 7/21-8/1 for e.faecalis bacteremia, AKI on CKD, DKA, tacrolimus  toxicity, and covid.  Today, reports having itching in lower pelvis, which she thinks is a UTI. This is her usual prodromal symptom prior to UTIs. She also had some very short, mild dysuria this morning, but this is not typical for her. She is also having itching around her vulva and rectum and all over her lower abdomen that she attributes to wipes from the hospital and depends, laying in bed, etc.   She denies being on daily abx prophylaxis in the past for UTI. Was on bactrim  for her kidney rejection in the past. No timing of UTI in relation to sexual intercourse.  She has never been seen in ID specific clinic. She cannot takes pills right now and requires liquid medication.  I  reviewed records of UA/Ucx data: 10/06/23 large LE, neg nitrite, small blood, +glu  - Ucx >100k E.faecalis (doxy resistant) treated w/vanc > ampicillin. Echo neg  - susceptible: amp, nitrofurantoin, vanc 09/28/23 neg LE, neg nitrtie, trace blood, +glu  - Ucx e.faecalis UTI > bacteremia w/+BCx 08/25/23 neg/neg, +glu - Ucx MUF 07/27/23 neg/neg, +glu, trace blood - Ucx MUF 05/23/23 mod LE, neg nit, mod blood, +glu - Ucx MUF 05/18/23 small LE, neg nit, mod blood, +glu - Ucx MUF 05/10/23 unable to determine LE, neg nit, large blood, +glu 05/07/23 OSH Ucx e.faecalis + e.coli treated IV cefepime  + vanc > levaquin  + amox x 4w until follow-up with ICID 04/13/23 Ucx MUF 03/25/23 Ucx MUF 1/3 Ucx 10-50k e.coli 03/15/23 Ucx 10-50k e.coli 03/12/23 Ucx 10-50k e.coli   I reviewed the records from [facility] describing her chronic medical problems: 1. kidney transplant 03/03/2023. baseline creatinine 2s mg/dl post treatment for acute rejection; stage iv chronic kidney disease. 2. Chronic antibody mediated rejection, acute T cell mediated rejection (last renal bx 09/15/2023)- has received steroids to treat this 3.  immunosuppression. mycophenolate  750mg  bid. prednisone  5mg  daily, tacrolimus   4.  hypertension. blood pressure goal < 130/80 mmhg. On carvedilol  , holding nifedipine  due to low blood pressure 5. hyperlipidemia. pravastatin  40mg  daily. 6. diabetes mellitus2. tresiba  insulin   7. left lower ext dvt. apixaban  5mg  bid. 8. preventive medicine. trimethoprim  sulfamethoxazole  80/400mg  3x/week x46m 12/24-6/25. 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 GERD, gastroparesis, hiatal hernia, esophageal stricture  Urinary Symptoms: She denies leakage with cough and sneeze and denies leakage with urge.   She wears adult diapers.  She admits to nocturia, getting up 2 times per night to void.  She denies hematuria, pyleonephritis or kidney stones.  She has had  4 UTI's in the past year. She typically drinks soda and  plain water .   Pelvic Organ Prolapse Symptoms:                  She denies a feeling of a bulge the vaginal area.   Bowel Symptom: She has a bowel movement 4 time(s) per day.  Her stools are usually loose.  She uses no bowel regimen.  She denies straining and denies splinting. She admits to incontinence of liquid stool. She empties her rectum with defecation.  She admits to urgency with defecation.  Past Medical History[1]  Past Surgical History[2]  OB History     Gravida  1   Para  0   Term  0   Preterm      AB      Living         SAB      IAB      Ectopic      Molar      Multiple      Live Births              She has had 0 vaginal deliveries and 0 cesarean deliveries.   Gynecologic History: She is not menopausal.  She does not have a history of abnormal Pap smears. Last pap smear was 2024.  Last colonoscopy was n/a.  She is sexually active and she admits to associated pain.   Current Medications[3]  Allergies[4]  Social History:  She is in a relationship. She works as Designer, jewellery.   Family History[5]  Review of Systems: A full 10 system review was negative except as noted in the History of Present Illness.  Objective:   Physical Exam:  BP 103/77 (BP Site: L Arm, BP Position: Sitting)   Pulse 97   Ht 167.6 cm (5' 5.98)   Wt 77.1 kg (170 lb)   BMI 27.45 kg/m  Constitutional:  General appearance: Well nourished, well developed female in no acute distress.  Psych:  Normal mood and affect Respiratory:  Normal respiratory effort.  Cardiovascular:  No lower extremity edema.  Skin:  Warm and dry, no lesions. Lymphatic:  No inguinal lymphadenopathy.  Gastrointestional:  No masses.  No abdominal tenderness.  No hernias. No hepatosplenomegaly. No guarding or rebound. Genitourinary:  Pelvic Exam: Normal external female genitalia, no rashes or bumps; Bartholin's and Skene's glands normal in appearance; urethral meatus normal in appearance, no  urethral masses or discharge. Speculum exam reveals vaginal mucosa without lesions and with atrophy; Adnexa  normal adnexa.  No masses on bimanual exam.  Mild bilateral levator muscle pain on exam.  Post-Void Residual (PVR) by Bladder Scan: In order to evaluate bladder emptying, we discussed obtaining a postvoid residual and she agreed to this procedure.  Procedure: The ultrasound unit was placed on the patient's abdomen in the suprapubic region after the patient had voided. A PVR of 29 ml was obtained by bladder scan on 10/26/23.     10/26/2023   10:58 AM  POP-Q Exams  gh 3  pb 2  tvl 5  aa -2.5  ba -2.5  ap -3  bp -3  c -3    Rectal Exam:  Not performed. External skin breakdown present, healing.  Neuro Exam:  Alert and oriented x 3 Perineal sensation normal  Medical chaperone present for exam: Leonor, CMA Prior to the outpatient exam, consent was obtained from the patient prior to the sensitive portion of the exam. A student was not involved.  Laboratory Results: Results for orders placed or performed in visit on 10/26/23  POCT Urinalysis Dipstick  Result Value Ref Range   Spec Gravity/POC 1.025 1.003 - 1.030   PH/POC 5.5 5.0 - 9.0   Leuk Esterase/POC Negative Negative   Nitrite/POC Negative Negative   Protein/POC 2+ (A) Negative   UA Glucose/POC Negative Negative   Ketones, POC Negative Negative   Bilirubin/POC Negative Negative   Blood/POC Negative Negative   Urobilinogen/POC 0.2 0.2 - 1.0 mg/dL     Assessment/Plan:   Assessment: This is a 38 y.o. with recurrent UTI's with complex medical history.  Plan: Recurrent UTI For treatment of recurrent urinary tract infections, we discussed management of recurrent UTIs including prophylaxis with a daily low dose antibiotic, transvaginal estrogen therapy, D-mannose, and cranberry supplements.  We discussed the role of diagnostic testing such as cystoscopy and upper tract imaging. Suspect in the setting of her complex  medical history that difficult to control T2DM plays a role as well as her immunocompromised state to recurrent UTI so part of management will be good control of BG, which she works very closely with nephrology and Endo/PCP to manage.  - UA at today's visit without signs of acute infection and per IDSA guidelines, do not recommend screening for asymptomatic bacteruria even in immunocompromised patient, so will defer urine culture at this time. If she requires abx and treatment for UTI in the future, needs liquid form. She usually is prescribed this by her Nephrologist clinic - will try vaginal estrogen as this would avoid further abx for her. Pea-sized amount nightly for 2 weeks then twice/week until follow-up. Discussed vulvar hygiene and barrier creams for current vulvar irritation. However, appears to be healing and improving and hopeful this will continue to improve following hospitalization. Could consider steroid ointment in the future if not improving. - if no improvement at follow-up, will likely proceed with daily abx prophylaxis. Due to kidney function, would avoid nitrofurantoin. Would likely try amoxicillin  or bactrim , and could discuss with pharmacy for dosing for this patient.      Follow up 3 months or sooner as needed or send a My Chart message for any questions All questions answered. Handouts given.  Time Spent: I personally spent 60 minutes face-to-face and non-face-to-face in the care of this patient, which includes all pre, intra, and post visit time on the date of service.  All documented time was specific to the E/M visit and does not include any procedures that may have been performed.         [1] Past Medical History: Diagnosis Date  . Diabetes mellitus       . ESRD (end stage renal disease)       . Hypertension   [2] Past Surgical History: Procedure Laterality Date  . NEPHRECTOMY TRANSPLANTED ORGAN    . PR TRANSPLANT,PREP CADAVER RENAL GRAFT N/A 03/03/2023    Procedure: [place] STD PREP CAD DONR RENAL ALLOGFT PRIOR TO TRNSPLNT, INCL DISSEC/REM PERINEPH FAT, DIAPH/RTPER ATTAC;  Surgeon: Toledo, Marsa Messier, MD;  Location: OR [facility];  Service: Transplant  . PR TRANSPLANTATION OF KIDNEY N/A 03/03/2023   Procedure: RENAL ALLOTRANSPLANTATION, IMPLANTATION OF GRAFT; WITHOUT RECIPIENT NEPHRECTOMY;  Surgeon: Toledo, Marsa Messier, MD;  Location: OR [facility];  Service: Transplant  [3]  Current Outpatient Medications:  .  calcitriol  (ROCALTROL ) 1 mcg/mL solution, Take 0.5 mL (0.5 mcg total) by mouth daily., Disp: 15 mL, Rfl: 12 .  calcium  carbonate suspension 1,250 mg (500 mg elem calcium )/5 mL, Take 5 mL (  500 mg elem calcium  total) by mouth Three (3) times a day., Disp: , Rfl:  .  carboxymethylcellulose sodium (THERATEARS) 0.25 % Drop, Administer 1 drop to both eyes four (4) times a day as needed. For dry eyes, Disp: 15 mL, Rfl: 0 .  carvedilol  (COREG ) 12.5 MG tablet, Take 1 tablet (12.5 mg total) by mouth two (2) times a day., Disp: 60 tablet, Rfl: 11 .  cetirizine  (ZYRTEC ) 10 MG tablet, Take 1 tablet (10 mg total) by mouth daily as needed for allergies., Disp: , Rfl:  .  diclofenac sodium (VOLTAREN) 1 % gel, 16 g. 4 grams four times per day as needed, Disp: , Rfl:  .  esomeprazole (NEXIUM) 40 MG capsule, Take 1 capsule (40 mg total) by mouth two (2) times a day., Disp: , Rfl:  .  fluticasone  propionate (FLONASE ) 50 mcg/actuation nasal spray, 2 sprays into each nostril two (2) times a day as needed., Disp: , Rfl:  .  glucose 4 GM chewable tablet, Chew 4 tablets (16 g total) every ten (10) minutes as needed for low blood sugar ((For Blood Glucose LESS than 70 mg/dL and GREATER than or EQUAL to 54 mg/dL and able to take PO.))., Disp: 50 tablet, Rfl: 12 .  guaiFENesin  (ROBITUSSIN) 100 mg/5 mL syrup, Take 10 mL (200 mg total) by mouth every four (4) hours as needed for congestion., Disp: 120 mL, Rfl: 0 .  hydrOXYzine  (ATARAX ) 25 MG tablet, Take 1-2 tablets  (25-50 mg total) by mouth every six (6) hours as needed for itching or anxiety., Disp: 120 tablet, Rfl: 2 .  insulin  aspart (NOVOLOG  FLEXPEN) 100 unit/mL (3 mL) injection pen, inject 4-10 units  under the skin with meals as directed.  Max 30 units per day, Disp: 30 mL, Rfl: 3 .  insulin  degludec (TRESIBA  FLEXTOUCH U-100) 100 unit/mL (3 mL) InPn, Inject 0.2 mL (20 Units total) under the skin nightly., Disp: 15 mL, Rfl: 2 .  mycophenolate  (CELLCEPT ) 200 mg/mL suspension, Take 3.8 mL (760 mg total) by mouth two (2) times a day., Disp: 320 mL, Rfl: 12 .  NIFEdipine  (PROCARDIA  XL) 90 MG 24 hr tablet, Take 1 tablet (90 mg total) by mouth daily., Disp: 100 tablet, Rfl: 3 .  nystatin  (MYCOSTATIN ) 100,000 unit/gram cream, Apply topically two (2) times a day., Disp: 30 g, Rfl: 0 .  pen needle, diabetic (BD ULTRA-FINE NANO PEN NEEDLE) 32 gauge x 5/32 (4 mm) Ndle, Use Four (4) times a day (before meals and nightly)., Disp: 200 each, Rfl: 12 .  pravastatin  (PRAVACHOL ) 40 MG tablet, Take 1 tablet (40 mg total) by mouth nightly., Disp: 30 tablet, Rfl: 11 .  predniSONE  (DELTASONE ) 10 MG tablet, Take 1 tablet (10 mg total) by mouth in the morning., Disp: 30 tablet, Rfl: 0 .  sucralfate  (CARAFATE ) 100 mg/mL suspension, Take 10 mL (1 g total) by mouth Four (4) times a day with a meal and nightly., Disp: 420 mL, Rfl: 0 .  sulfamethoxazole -trimethoprim  (BACTRIM ) 400-80 mg per tablet, Take 1 tablet (80 mg of trimethoprim  total) by mouth 2 times a week (Tuesday and Saturday)., Disp: 8 tablet, Rfl: 0 .  tacrolimus  (PROGRAF ) 1 MG capsule, Take 3 capsules (3 mg total) by mouth two (2) times a day., Disp: 180 capsule, Rfl: 11 .  thiamine (B-1) 100 MG tablet, Take 1 tablet (100 mg total) by mouth daily., Disp: 100 tablet, Rfl: 0 .  traZODone (DESYREL) 50 MG tablet, Take 1 tablet (50 mg total) by mouth nightly  as needed for sleep., Disp: 30 tablet, Rfl: 0 .  valGANciclovir  (VALCYTE ) 50 mg/mL SolR, Take 9 mL (450 mg total) by  mouth 2 times a week (Tuesday and Saturday)., Disp: 100 mL, Rfl: 0 .  amoxicillin  (AMOXIL ) 400 mg/5 mL suspension, Take 6.3 mL (500 mg total) by mouth two (2) times a day for 7 days. (Patient not taking: Reported on 10/26/2023), Disp: 88.2 mL, Rfl: 0 .  [START ON 10/27/2023] conjugated estrogens (PREMARIN) 0.625 mg/gram vaginal cream, Insert 0.5 g into the vagina Two (2) times a week. Insert a pea-sized amount into the vagina every night for 2 weeks then two times a week until follow-up., Disp: 4 g, Rfl: 11 .  [Paused] gabapentin  (NEURONTIN ) 400 MG capsule, Take 1 capsule (400 mg total) by mouth nightly. (Patient not taking: Reported on 10/26/2023), Disp: 90 capsule, Rfl: 3 .  [Paused] magnesium  oxide-Mg AA chelate (MAGNESIUM , AMINO ACID CHELATE,) 133 mg, Take 2 tablets by mouth in the morning. (Patient not taking: Reported on 10/26/2023), Disp: 60 tablet, Rfl: 11 [4] Allergies Allergen Reactions  . Diphenhydramine     anaphylaxis  . Diphenhydramine-Zinc  Acetate Shortness Of Breath  . Shellfish Containing Products Hives    hives  . Other Other (See Comments)    Uncoded Allergy. Allergen: In-fed, Other Reaction: Make the patient itch  . Ibuprofen   . Chlorhexidine  Itching  . Ferrous Sulfate Itching  . Iodinated Contrast Media Itching    Pt states that she was 10 when she had the reaction.  She has had the contrast since with no steroid prep and had no issues.  . Iron  Dextran Itching and Other (See Comments)    REACTION: vein irritation REACTION: vein irritation REACTION: vein irritation REACTION: vein irritation   [5] Family History Problem Relation Age of Onset  . No Known Problems Mother   . No Known Problems Father   . No Known Problems Maternal Grandmother   . No Known Problems Maternal Grandfather   . No Known Problems Paternal Grandmother   . No Known Problems Paternal Grandfather   . Kidney disease Neg Hx

## 2023-10-25 NOTE — Telephone Encounter (Signed)
 Pt reported UTI symptoms and rash from using depends while admitted. Per Dr. Melba, start amoxicillin  500 mg BID x7 days and use nystatin  cream for rash. Advised pt to leave labs and urine sample today prior to abx if possible. Scheduled to see urogyn tomorrow, stressed importance of keeping the appointment.     Pt stated her father passed away a few days ago and was unable to repeat labs sooner. She will increase water  intake and repeat labs today. No issues with missed medications or vomiting at this time. BP 120-130s/85s, holding coreg  when <120/80. She is working with [facility] and PT from admission. No other needs at this time.

## 2023-10-27 NOTE — Progress Notes (Signed)
 Patient reported [facility] not receiving amoxicillin  rx sent in on Tuesday (10/25/23) for UTI, awaiting culture results. Pt was seen by urogyn today to establish care, left another urine sample - urine culture pending. Pt was able to pickup nystatin  cream on Tuesday. Reports insurance will not pay for estrogen cream prescribed by urogyn. Reminded patient to call [facility] in a timely manner if unable to pick up medications and reviewed on-call [facility] phone number if after-hours, she verbalized understanding.

## 2023-10-31 ENCOUNTER — Emergency Department (HOSPITAL_BASED_OUTPATIENT_CLINIC_OR_DEPARTMENT_OTHER)
Admission: EM | Admit: 2023-10-31 | Discharge: 2023-11-01 | Disposition: A | Discharge status: Home / Self Care | Attending: Emergency Medicine | Admitting: Emergency Medicine

## 2023-10-31 ENCOUNTER — Other Ambulatory Visit: Payer: Self-pay

## 2023-10-31 ENCOUNTER — Emergency Department (HOSPITAL_BASED_OUTPATIENT_CLINIC_OR_DEPARTMENT_OTHER)

## 2023-10-31 DIAGNOSIS — Z79899 Other long term (current) drug therapy: Secondary | ICD-10-CM | POA: Insufficient documentation

## 2023-10-31 DIAGNOSIS — R1013 Epigastric pain: Secondary | ICD-10-CM | POA: Diagnosis not present

## 2023-10-31 DIAGNOSIS — Z940 Kidney transplant status: Secondary | ICD-10-CM | POA: Diagnosis not present

## 2023-10-31 DIAGNOSIS — U071 COVID-19: Secondary | ICD-10-CM | POA: Diagnosis not present

## 2023-10-31 DIAGNOSIS — R42 Dizziness and giddiness: Secondary | ICD-10-CM | POA: Diagnosis present

## 2023-10-31 DIAGNOSIS — Z794 Long term (current) use of insulin: Secondary | ICD-10-CM | POA: Insufficient documentation

## 2023-10-31 DIAGNOSIS — R6889 Other general symptoms and signs: Secondary | ICD-10-CM

## 2023-10-31 DIAGNOSIS — R000 Tachycardia, unspecified: Secondary | ICD-10-CM | POA: Insufficient documentation

## 2023-10-31 DIAGNOSIS — Z91010 Allergy to peanuts: Secondary | ICD-10-CM | POA: Insufficient documentation

## 2023-10-31 DIAGNOSIS — R0602 Shortness of breath: Secondary | ICD-10-CM | POA: Diagnosis not present

## 2023-10-31 DIAGNOSIS — R112 Nausea with vomiting, unspecified: Secondary | ICD-10-CM | POA: Diagnosis present

## 2023-10-31 LAB — BASIC METABOLIC PANEL WITH GFR
Anion gap: 15 (ref 5–15)
BUN: 37 mg/dL — ABNORMAL HIGH (ref 6–20)
CO2: 13 mmol/L — ABNORMAL LOW (ref 22–32)
Calcium: 7.3 mg/dL — ABNORMAL LOW (ref 8.9–10.3)
Chloride: 108 mmol/L (ref 98–111)
Creatinine, Ser: 3.76 mg/dL — ABNORMAL HIGH (ref 0.44–1.00)
GFR, Estimated: 15 mL/min — ABNORMAL LOW (ref >60)
Glucose, Bld: 232 mg/dL — ABNORMAL HIGH (ref 70–99)
Potassium: 4.5 mmol/L (ref 3.5–5.1)
Sodium: 135 mmol/L (ref 135–145)

## 2023-10-31 LAB — CBC
HCT: 28.3 % — ABNORMAL LOW (ref 36.0–46.0)
Hemoglobin: 9.2 g/dL — ABNORMAL LOW (ref 12.0–15.0)
MCH: 29.1 pg (ref 26.0–34.0)
MCHC: 32.5 g/dL (ref 30.0–36.0)
MCV: 89.6 fL (ref 80.0–100.0)
Platelets: 286 K/uL (ref 150–400)
RBC: 3.16 MIL/uL — ABNORMAL LOW (ref 3.87–5.11)
RDW: 14.4 % (ref 11.5–15.5)
WBC: 2.6 K/uL — ABNORMAL LOW (ref 4.0–10.5)
nRBC: 0 % (ref 0.0–0.2)

## 2023-10-31 LAB — TROPONIN T, HIGH SENSITIVITY: Troponin T High Sensitivity: 32 ng/L — ABNORMAL HIGH (ref 0–19)

## 2023-10-31 MED ORDER — PROCHLORPERAZINE EDISYLATE 10 MG/2ML IJ SOLN
10.0000 mg | Freq: Once | INTRAMUSCULAR | Status: AC
  Administered 2023-10-31: 10 mg via INTRAVENOUS
  Filled 2023-10-31: qty 2

## 2023-10-31 NOTE — ED Triage Notes (Signed)
 Pt POV reporting blurred vision, dizziness, and chills, began around 6pm when she went to work. Also reporting weakness, concerned about hemoglobin, no unilateral weakness or slurred speech noted. Kidney transplant pt, admitted 7/22 for rejection.

## 2023-11-01 ENCOUNTER — Other Ambulatory Visit: Payer: Self-pay

## 2023-11-01 ENCOUNTER — Emergency Department (HOSPITAL_COMMUNITY)

## 2023-11-01 ENCOUNTER — Emergency Department (HOSPITAL_COMMUNITY)
Admission: EM | Admit: 2023-11-01 | Discharge: 2023-11-01 | Disposition: A | Discharge status: Home / Self Care | Source: Home / Self Care | Attending: Emergency Medicine | Admitting: Emergency Medicine

## 2023-11-01 ENCOUNTER — Encounter (HOSPITAL_COMMUNITY): Payer: Self-pay | Admitting: Emergency Medicine

## 2023-11-01 DIAGNOSIS — U071 COVID-19: Secondary | ICD-10-CM | POA: Insufficient documentation

## 2023-11-01 DIAGNOSIS — Z91010 Allergy to peanuts: Secondary | ICD-10-CM | POA: Insufficient documentation

## 2023-11-01 DIAGNOSIS — Z794 Long term (current) use of insulin: Secondary | ICD-10-CM | POA: Insufficient documentation

## 2023-11-01 DIAGNOSIS — K209 Esophagitis, unspecified without bleeding: Secondary | ICD-10-CM | POA: Insufficient documentation

## 2023-11-01 DIAGNOSIS — Z7901 Long term (current) use of anticoagulants: Secondary | ICD-10-CM | POA: Insufficient documentation

## 2023-11-01 DIAGNOSIS — R110 Nausea: Secondary | ICD-10-CM

## 2023-11-01 LAB — URINALYSIS, ROUTINE W REFLEX MICROSCOPIC
Bilirubin Urine: NEGATIVE
Bilirubin Urine: NEGATIVE
Glucose, UA: >=500 mg/dL — AB
Glucose, UA: NEGATIVE mg/dL
Hgb urine dipstick: NEGATIVE
Hgb urine dipstick: NEGATIVE
Ketones, ur: NEGATIVE mg/dL
Ketones, ur: NEGATIVE mg/dL
Leukocytes,Ua: NEGATIVE
Leukocytes,Ua: NEGATIVE
Nitrite: NEGATIVE
Nitrite: NEGATIVE
Protein, ur: 30 mg/dL — AB
Protein, ur: 30 mg/dL — AB
Specific Gravity, Urine: 1.013 (ref 1.005–1.030)
Specific Gravity, Urine: 1.02 (ref 1.005–1.030)
pH: 5 (ref 5.0–8.0)
pH: 5.5 (ref 5.0–8.0)

## 2023-11-01 LAB — HEPATIC FUNCTION PANEL
ALT: 7 U/L (ref 0–44)
AST: 17 U/L (ref 15–41)
Albumin: 4.3 g/dL (ref 3.5–5.0)
Alkaline Phosphatase: 57 U/L (ref 38–126)
Bilirubin, Direct: 0.1 mg/dL (ref 0.0–0.2)
Indirect Bilirubin: 0.3 mg/dL (ref 0.3–0.9)
Total Bilirubin: 0.4 mg/dL (ref 0.0–1.2)
Total Protein: 8.3 g/dL — ABNORMAL HIGH (ref 6.5–8.1)

## 2023-11-01 LAB — RESP PANEL BY RT-PCR (RSV, FLU A&B, COVID)  RVPGX2
Influenza A by PCR: NEGATIVE
Influenza B by PCR: NEGATIVE
Resp Syncytial Virus by PCR: NEGATIVE
SARS Coronavirus 2 by RT PCR: POSITIVE — AB

## 2023-11-01 LAB — COMPREHENSIVE METABOLIC PANEL WITH GFR
ALT: 10 U/L (ref 0–44)
AST: 15 U/L (ref 15–41)
Albumin: 3.9 g/dL (ref 3.5–5.0)
Alkaline Phosphatase: 47 U/L (ref 38–126)
Anion gap: 11 (ref 5–15)
BUN: 41 mg/dL — ABNORMAL HIGH (ref 6–20)
CO2: 17 mmol/L — ABNORMAL LOW (ref 22–32)
Calcium: 7.1 mg/dL — ABNORMAL LOW (ref 8.9–10.3)
Chloride: 106 mmol/L (ref 98–111)
Creatinine, Ser: 3.97 mg/dL — ABNORMAL HIGH (ref 0.44–1.00)
GFR, Estimated: 14 mL/min — ABNORMAL LOW (ref >60)
Glucose, Bld: 418 mg/dL — ABNORMAL HIGH (ref 70–99)
Potassium: 4.1 mmol/L (ref 3.5–5.1)
Sodium: 134 mmol/L — ABNORMAL LOW (ref 135–145)
Total Bilirubin: 0.7 mg/dL (ref 0.0–1.2)
Total Protein: 8.3 g/dL — ABNORMAL HIGH (ref 6.5–8.1)

## 2023-11-01 LAB — CBC
HCT: 27.1 % — ABNORMAL LOW (ref 36.0–46.0)
Hemoglobin: 9 g/dL — ABNORMAL LOW (ref 12.0–15.0)
MCH: 29.2 pg (ref 26.0–34.0)
MCHC: 33.2 g/dL (ref 30.0–36.0)
MCV: 88 fL (ref 80.0–100.0)
Platelets: 270 K/uL (ref 150–400)
RBC: 3.08 MIL/uL — ABNORMAL LOW (ref 3.87–5.11)
RDW: 14.1 % (ref 11.5–15.5)
WBC: 3 K/uL — ABNORMAL LOW (ref 4.0–10.5)
nRBC: 0 % (ref 0.0–0.2)

## 2023-11-01 LAB — CBG MONITORING, ED
Glucose-Capillary: 389 mg/dL — ABNORMAL HIGH (ref 70–99)
Glucose-Capillary: 367 mg/dL — ABNORMAL HIGH (ref 70–99)

## 2023-11-01 LAB — LIPASE, BLOOD
Lipase: 28 U/L (ref 11–51)
Lipase: 34 U/L (ref 11–51)

## 2023-11-01 LAB — HCG, QUANTITATIVE, PREGNANCY: hCG, Beta Chain, Quant, S: 11 m[IU]/mL — ABNORMAL HIGH (ref <5)

## 2023-11-01 LAB — TROPONIN T, HIGH SENSITIVITY: Troponin T High Sensitivity: 32 ng/L — ABNORMAL HIGH (ref 0–19)

## 2023-11-01 LAB — PREGNANCY, URINE: Preg Test, Ur: NEGATIVE

## 2023-11-01 MED ORDER — METOCLOPRAMIDE HCL 10 MG PO TABS: 10.0000 mg | ORAL_TABLET | Freq: Four times a day (QID) | ORAL | 0 refills | Status: DC

## 2023-11-01 MED ORDER — FAMOTIDINE IN NACL 20-0.9 MG/50ML-% IV SOLN
20.0000 mg | Freq: Once | INTRAVENOUS | Status: AC
  Administered 2023-11-01: 20 mg via INTRAVENOUS
  Filled 2023-11-01: qty 50

## 2023-11-01 MED ORDER — METOCLOPRAMIDE HCL 5 MG/ML IJ SOLN
10.0000 mg | Freq: Once | INTRAMUSCULAR | Status: DC
  Filled 2023-11-01: qty 2

## 2023-11-01 MED ORDER — INSULIN ASPART 100 UNIT/ML IJ SOLN
10.0000 [IU] | Freq: Once | INTRAMUSCULAR | Status: AC
  Administered 2023-11-01: 10 [IU] via INTRAVENOUS

## 2023-11-01 MED ORDER — ONDANSETRON HCL 4 MG/2ML IJ SOLN
4.0000 mg | Freq: Once | INTRAMUSCULAR | Status: AC
  Administered 2023-11-01: 4 mg via INTRAVENOUS
  Filled 2023-11-01: qty 2

## 2023-11-01 MED ORDER — HYDROMORPHONE HCL 1 MG/ML IJ SOLN
1.0000 mg | Freq: Once | INTRAMUSCULAR | Status: AC
  Administered 2023-11-01: 1 mg via INTRAVENOUS
  Filled 2023-11-01: qty 1

## 2023-11-01 MED ORDER — HYDROMORPHONE HCL 1 MG/ML IJ SOLN
0.5000 mg | Freq: Once | INTRAMUSCULAR | Status: AC
  Administered 2023-11-01: 0.5 mg via INTRAVENOUS
  Filled 2023-11-01: qty 1

## 2023-11-01 MED ORDER — OXYCODONE HCL 5 MG PO TABS
5.0000 mg | ORAL_TABLET | ORAL | 0 refills | Status: AC | PRN
Start: 2023-11-01 — End: ?

## 2023-11-01 MED ORDER — SODIUM CHLORIDE 0.9 % IV BOLUS
1000.0000 mL | Freq: Once | INTRAVENOUS | Status: AC
  Administered 2023-11-01: 1000 mL via INTRAVENOUS

## 2023-11-01 MED ORDER — ONDANSETRON 4 MG PO TBDP: 4.0000 mg | ORAL_TABLET | Freq: Three times a day (TID) | ORAL | 0 refills | Status: DC | PRN

## 2023-11-01 MED ORDER — SUCRALFATE 1 GM/10ML PO SUSP
1.0000 g | Freq: Once | ORAL | Status: DC
  Filled 2023-11-01: qty 10

## 2023-11-01 MED ORDER — ASPIRIN 325 MG PO TABS
325.0000 mg | ORAL_TABLET | Freq: Once | ORAL | Status: DC
  Filled 2023-11-01: qty 1

## 2023-11-01 NOTE — ED Provider Notes (Signed)
 [place] EMERGENCY DEPARTMENT AT [facility] Provider Note   CSN: [id number] Arrival date & time: 11/01/23  9141     Patient presents with: Abdominal Pain, Emesis, Nausea, and Chills   [name] is a 38 y.o. female.   HPI Was diagnosed with COVID yesterday presents with ongoing epigastric pain, nausea, vomiting, diarrhea. Illness began prior to yesterday's diagnosis.  She notes that after being diagnosed at our affiliate facility she has felt worse. She has been unable to take medication.    Prior to Admission medications   Medication Sig Start Date End Date Taking? Authorizing Provider  metoCLOPramide  (REGLAN ) 10 MG tablet Take 1 tablet (10 mg total) by mouth every 6 (six) hours. 11/01/23  Yes Garrick Charleston, MD  calcitRIOL  (ROCALTROL ) 0.5 MCG capsule Take 0.5 mcg by mouth daily. 05/16/23   [provider]  Calcium  Carbonate Antacid (CALCIUM  CARBONATE, DOSED IN MG ELEMENTAL CALCIUM ,) 1250 MG/5ML SUSP Take 500 mg of elemental calcium  by mouth 3 (three) times daily. 05/15/23   [provider]  carvedilol  (COREG ) 12.5 MG tablet Take 12.5 mg by mouth 2 (two) times daily. 05/16/23   [provider]  conjugated estrogens (PREMARIN) vaginal cream Place 1 applicator vaginally daily as needed (for dryness). 05/23/23 05/22/24  [provider]  ELIQUIS  5 MG TABS tablet Take 5 mg by mouth 2 (two) times daily. Patient not taking: Reported on 08/09/2023 05/16/23   [provider]  esomeprazole (NEXIUM) 40 MG capsule Take 40 mg by mouth 2 (two) times daily before a meal. 09/27/23   [provider]  famotidine  (PEPCID ) 20 MG tablet Take 1 tablet (20 mg total) by mouth 2 (two) times daily. 06/21/23   Mesner, Selinda, MD  gabapentin  (NEURONTIN ) 300 MG capsule Take 300 mg by mouth at bedtime.    [provider]  hydrOXYzine  (ATARAX ) 25 MG tablet Take 25 mg by mouth daily as needed for itching. 05/16/23   [provider]  Insulin   Aspart FlexPen (NOVOLOG ) 100 UNIT/ML inject 4-10 units  under the skin with meals as directed.  Max 30 units per day 05/19/23   [provider]  insulin  degludec (TRESIBA ) 100 UNIT/ML FlexTouch Pen Inject 30 Units into the skin daily. 05/19/23 05/13/24  [provider]  methocarbamol  (ROBAXIN ) 500 MG tablet Take 1 tablet (500 mg total) by mouth every 8 (eight) hours as needed for muscle spasms. 07/14/23   Harden Jerona GAILS, MD  mycophenolate  (CELLCEPT ) 250 MG capsule Take 1 capsule (250 mg total) by mouth 2 (two) times daily. Please resume your home dose of 750 twice daily dose once you have finished your deficit treatment on 06/18/2023. 06/14/23   Singh, Prashant K, MD  NIFEdipine  (PROCARDIA -XL/NIFEDICAL-XL) 30 MG 24 hr tablet Take 30 mg by mouth daily. Patient not taking: Reported on 08/09/2023 05/19/23 05/18/24  [provider]  oxyCODONE  (ROXICODONE ) 5 MG immediate release tablet Take 1 tablet (5 mg total) by mouth every 4 (four) hours as needed for severe pain (pain score 7-10). 11/01/23   Garrick Charleston, MD  pravastatin  (PRAVACHOL ) 40 MG tablet Take 40 mg by mouth daily. 05/16/23   [provider]  predniSONE  (DELTASONE ) 5 MG tablet Take 5 mg by mouth daily with breakfast.    [provider]  promethazine  (PHENERGAN ) 25 MG suppository Place 1 suppository (25 mg total) rectally every 6 (six) hours as needed for nausea or vomiting. Patient not taking: Reported on 08/09/2023 06/21/23   Mesner, Selinda, MD  promethazine  (PHENERGAN )  25 MG tablet Take 1 tablet (25 mg total) by mouth every 6 (six) hours as needed for nausea or vomiting. Patient not taking: Reported on 08/09/2023 06/21/23   Mesner, Selinda, MD  sucralfate  (CARAFATE ) 1 g tablet Take 1 tablet (1 g total) by mouth 4 (four) times daily -  with meals and at bedtime for 15 days. 06/21/23 07/06/23  Mesner, Selinda, MD  sulfamethoxazole -trimethoprim  (BACTRIM ) 400-80 MG tablet Take 1 tablet by mouth 3 (three) times a week. Monday,  Wednesday and Fridays 05/16/23   [provider]  tacrolimus  (PROGRAF ) 5 MG capsule Take 1 capsule (5 mg total) by mouth 2 (two) times daily. 06/10/23   Rosario Leatrice FERNS, MD  valGANciclovir  (VALCYTE ) 450 MG tablet Take 1 tablet (450 mg total) by mouth daily. Patient not taking: Reported on 08/09/2023 06/11/23   Rosario Leatrice I, MD    Allergies: Diphenhydramine, Peanut-containing drug products, Contrast media [iodinated contrast media], Shellfish allergy, Chlorhexidine , Ferrous sulfate, and Iron  dextran    Review of Systems  Updated Vital Signs BP (!) 159/94   Pulse (!) 104   Temp 98.1 F (36.7 C) (Oral)   Resp (!) 26   SpO2 100%   Physical Exam Vitals and nursing note reviewed.  Constitutional:      General: She is not in acute distress.    Appearance: She is well-developed.  HENT:     Head: Normocephalic and atraumatic.  Eyes:     Conjunctiva/sclera: Conjunctivae normal.  Cardiovascular:     Rate and Rhythm: Regular rhythm. Tachycardia present.  Pulmonary:     Effort: Pulmonary effort is normal. No respiratory distress.     Breath sounds: Normal breath sounds. No stridor.  Abdominal:     General: There is no distension.     Tenderness: There is abdominal tenderness in the epigastric area.  Skin:    General: Skin is warm and dry.  Neurological:     Mental Status: She is alert and oriented to person, place, and time.     Cranial Nerves: No cranial nerve deficit.  Psychiatric:        Mood and Affect: Mood normal.     (all labs ordered are listed, but only abnormal results are displayed) Labs Reviewed  COMPREHENSIVE METABOLIC PANEL WITH GFR - Abnormal; Notable for the following components:      Result Value   Sodium 134 (*)    CO2 17 (*)    Glucose, Bld 418 (*)    BUN 41 (*)    Creatinine, Ser 3.97 (*)    Calcium  7.1 (*)    Total Protein 8.3 (*)    GFR, Estimated 14 (*)    All other components within normal limits  CBC - Abnormal; Notable for the  following components:   WBC 3.0 (*)    RBC 3.08 (*)    Hemoglobin 9.0 (*)    HCT 27.1 (*)    All other components within normal limits  URINALYSIS, ROUTINE W REFLEX MICROSCOPIC - Abnormal; Notable for the following components:   APPearance HAZY (*)    Glucose, UA >=500 (*)    Protein, ur 30 (*)    Bacteria, UA RARE (*)    All other components within normal limits  HCG, QUANTITATIVE, PREGNANCY - Abnormal; Notable for the following components:   hCG, Beta Chain, Quant, S 11 (*)    All other components within normal limits  CBG MONITORING, ED - Abnormal; Notable for the following components:   Glucose-Capillary 389 (*)  All other components within normal limits  CBG MONITORING, ED - Abnormal; Notable for the following components:   Glucose-Capillary 367 (*)    All other components within normal limits  LIPASE, BLOOD    EKG: EKG Interpretation Date/Time:  Tuesday November 01 2023 09:20:16 EDT Ventricular Rate:  103 PR Interval:  148 QRS Duration:  90 QT Interval:  380 QTC Calculation: 498 R Axis:   53  Text Interpretation: Sinus tachycardia Borderline prolonged QT interval Confirmed by Garrick Charleston [phone]) on 11/01/2023 9:39:36 AM  Radiology: [facility] Chest 2 View Result Date: 11/01/2023 CLINICAL DATA:  COVID infection. Epigastric pain. Nausea and vomiting. EXAM: CHEST - 2 VIEW COMPARISON:  10/31/2023 FINDINGS: The heart size and mediastinal contours are within normal limits. Both lungs are clear. The visualized skeletal structures are unremarkable. IMPRESSION: No active cardiopulmonary disease. Electronically Signed   By: Norleen DELENA Kil M.D.   On: 11/01/2023 12:20   CT ABDOMEN PELVIS WO CONTRAST Result Date: 11/01/2023 CLINICAL DATA:  Acute abdominal pain. Nausea and vomiting. Tachycardia. Renal transplant patient. EXAM: CT ABDOMEN AND PELVIS WITHOUT CONTRAST TECHNIQUE: Multidetector CT imaging of the abdomen and pelvis was performed following the standard protocol without IV  contrast. RADIATION DOSE REDUCTION: This exam was performed according to the departmental dose-optimization program which includes automated exposure control, adjustment of the mA and/or kV according to patient size and/or use of iterative reconstruction technique. COMPARISON:  09/29/2023 FINDINGS: Lower chest: No acute findings. Hepatobiliary: No mass visualized on this unenhanced exam. Prior cholecystectomy. No evidence of biliary obstruction. Pancreas: No mass or inflammatory process visualized on this unenhanced exam. Spleen:  Within normal limits in size. Adrenals/Urinary tract: Diffuse atrophy and scarring of native left kidney again seen. Nonvisualization of right kidney. Renal transplant in the left iliac fossa remains unremarkable in appearance. No evidence of urolithiasis or hydronephrosis. No abnormal peritransplant fluid collections seen. Unremarkable unopacified urinary bladder. Stable postop changes seen in the right iliac fossa. Stomach/Bowel: Diffuse wall thickening seen involving the visualized portion of the distal thoracic esophagus, suspicious for esophagitis. No evidence of obstruction, inflammatory process, or abnormal fluid collections. Surgical clips again noted at the gastric antrum. Normal appendix visualized. Vascular/Lymphatic: No pathologically enlarged lymph nodes identified. No evidence of abdominal aortic aneurysm. Reproductive: Prior hysterectomy noted. Adnexal regions are unremarkable in appearance. Other:  None. Musculoskeletal:  No suspicious bone lesions identified. IMPRESSION: Unremarkable appearance of renal transplant in left iliac fossa. No evidence of urolithiasis or hydronephrosis. Distal esophageal wall thickening, suspicious for esophagitis. Electronically Signed   By: Norleen DELENA Kil M.D.   On: 11/01/2023 11:58   DG Chest Port 1 View Result Date: 10/31/2023 CLINICAL DATA:  Shortness of breath EXAM: PORTABLE CHEST 1 VIEW COMPARISON:  05/07/2023 FINDINGS: The heart size  and mediastinal contours are within normal limits. Both lungs are clear. The visualized skeletal structures are unremarkable. IMPRESSION: No active disease. Electronically Signed   By: Franky Crease M.D.   On: 10/31/2023 23:16     Procedures   Medications Ordered in the ED  sucralfate  (CARAFATE ) 1 GM/10ML suspension 1 g (1 g Oral Patient Refused/Not Given 11/01/23 1023)  aspirin  tablet 325 mg (325 mg Oral Patient Refused/Not Given 11/01/23 1023)  metoCLOPramide  (REGLAN ) injection 10 mg (has no administration in time range)  sodium chloride  0.9 % bolus 1,000 mL (0 mLs Intravenous Stopped 11/01/23 1226)  famotidine  (PEPCID ) IVPB 20 mg premix (0 mg Intravenous Stopped 11/01/23 1055)  HYDROmorphone  (DILAUDID ) injection 1 mg (1 mg Intravenous Given 11/01/23 1059)  sodium  chloride 0.9 % bolus 1,000 mL (1,000 mLs Intravenous New Bag/Given 11/01/23 1226)  HYDROmorphone  (DILAUDID ) injection 1 mg (1 mg Intravenous Given 11/01/23 1245)  ondansetron  (ZOFRAN ) injection 4 mg (4 mg Intravenous Given 11/01/23 1244)  insulin  aspart (novoLOG ) injection 10 Units (10 Units Intravenous Given 11/01/23 1240)                                    Medical Decision Making Patient with recent COVID positive test result, history of prior renal transplant now presents with nausea, vomiting, abdominal pain. Patient is awake, alert, afebrile, mentating appropriately, but is tachycardic, hypertensive on arrival.  Broad differential including gastroesophageal etiology versus pancreatitis, other intra-abdominal processes, less likely ACS, though hypertensive emergency is a consideration. Patient received antiemetics, monitoring, fluids, analgesics. Cardiac 105 sinus tach abnormal pulse ox 100% room air normal  Amount and/or Complexity of Data Reviewed External Data Reviewed: notes. Labs: ordered. Decision-making details documented in ED Course. Radiology: ordered and independent interpretation performed. Decision-making details  documented in ED Course. ECG/medicine tests: ordered and independent interpretation performed. Decision-making details documented in ED Course.  Risk OTC drugs. Prescription drug management. Decision regarding hospitalization. Diagnosis or treatment significantly limited by social determinants of health.  Patient awake alert on repeat exam.  We discussed meds for likely esophagitis, patient notes that she is taking Carafate  at home, has oral medication regimen, has been diagnosed with esophagitis for a long time, has had flares, somewhat similar to today's. She is scheduled to follow-up with [facility] for additional evaluation including endoscopy. 3:17 PM Patient's heart rate now 100, she is awake alert, sitting upright, looks better, states that she feels better.  Labs reviewed, discussed, in the context of COVID, hyperglycemia, there is no evidence for DKA, nor decompensated state.  She does have evidence for persistent renal dysfunction, and a scheduled follow-up with her transplant team in the coming days.  With improvement here following fluid resuscitation, no evidence for bacteremia, sepsis, improvement of nausea, vomiting, patient will follow-up with primary care.      Final diagnoses:  COVID  Esophagitis  Nausea    ED Discharge Orders          Ordered    metoCLOPramide  (REGLAN ) 10 MG tablet  Every 6 hours        11/01/23 1517    oxyCODONE  (ROXICODONE ) 5 MG immediate release tablet  Every 4 hours PRN        11/01/23 1517               Garrick Charleston, MD 11/01/23 1517

## 2023-11-01 NOTE — ED Notes (Signed)
 Pt vomited a small amount twice.

## 2023-11-01 NOTE — ED Notes (Signed)
 Pt to CT scan.

## 2023-11-01 NOTE — ED Triage Notes (Signed)
 Pt. Stated, I tested positive for COVID yesterday. Im having stomach pain, N/V /D since last night. Im having chills and not able to sleep

## 2023-11-01 NOTE — Inpatient Diabetes Management (Addendum)
 Inpatient Diabetes Program Recommendations  AACE/ADA: New Consensus Statement on Inpatient Glycemic Control (2025)  Target Ranges:  Prepandial:   less than 140 mg/dL      Peak postprandial:   less than 180 mg/dL (1-2 hours)      Critically ill patients:  140 - 180 mg/dL   Lab Results  Component Value Date   GLUCAP 389 (H) 11/01/2023   HGBA1C 12.1 (H) 06/09/2023    Latest Reference Range & Units 10/31/23 22:45  CO2 22 - 32 mmol/L 13 (L)  Glucose 70 - 99 mg/dL 767 (H)  Review of Glycemic Control  Latest Reference Range & Units 11/01/23 10:26  Glucose-Capillary 70 - 99 mg/dL 610 (H)   Diabetes history: DM 1 Outpatient Diabetes medications:  Novolog  4-10 units tid with meals Tresiba  30 units daily Current orders for Inpatient glycemic control:  None yet Inpatient Diabetes Program Recommendations:    Note history of Type 1 DM. Please recheck BMP to assess for DKA due to Type 1 DM?? Will follow.  Patient will need basal, correction and meal coverage insulin  while in the hospital.  If ketoacidosis is present, will likely need DKA orders added.   Addendum 1330- Please consider adding Novolog  0-6 units q 4 hours and Semglee  5 units bid.  Note CO2 was 17 on BMP.   Thanks,  Randall Bullocks, RN, BC-ADM Inpatient Diabetes Coordinator Pager [phone]  (8a-5p)

## 2023-11-01 NOTE — Discharge Instructions (Signed)
 Take Tylenol  1000 mg every 6 hours as needed for pain or fever.  Drink plenty of fluids and get plenty of rest.  Follow-up with your nephrologist in the next week, and return to the ER if symptoms significantly worsen or change.

## 2023-11-01 NOTE — ED Notes (Signed)
 Pt attempted to give urine sample but was not able to urinate at this time.

## 2023-11-01 NOTE — ED Notes (Signed)
 Pt refused sucralfate  and aspirin  saying she didn't want to take anything by mouth.

## 2023-11-01 NOTE — Discharge Instructions (Signed)
 Be sure to continue using your medication for esophagitis as well as your newly prescribed medications for nausea and pain. Given your history, it is important that you follow-up with your physicians at [facility]. Return here for concerning changes in your condition.

## 2023-11-01 NOTE — ED Provider Notes (Signed)
 [place] EMERGENCY DEPARTMENT AT [facility] Provider Note   CSN: [id number] Arrival date & time: 10/31/23  2103     Patient presents with: Dizziness and Chills   [name] is a 38 y.o. female.   Patient to ED with complaint of chills, weakness, poor focus, dizziness that started earlier today. She reports it feels like when she needed a blood transfusion previously. She also reports epigastric abdominal pain and feels this is related to her hiatal hernia and having been vomiting earlier. No syncope. No cough or congestion. No diarrhea. She is on her last day of antibiotic for UTI. History of renal transplant 02/2023, with hospitalization in July 2025 for possible rejection. She reports she has not been restarted dialysis.   Dizziness      Prior to Admission medications   Medication Sig Start Date End Date Taking? Authorizing Provider  calcitRIOL  (ROCALTROL ) 0.5 MCG capsule Take 0.5 mcg by mouth daily. 05/16/23   [provider]  Calcium  Carbonate Antacid (CALCIUM  CARBONATE, DOSED IN MG ELEMENTAL CALCIUM ,) 1250 MG/5ML SUSP Take 500 mg of elemental calcium  by mouth 3 (three) times daily. 05/15/23   [provider]  carvedilol  (COREG ) 12.5 MG tablet Take 12.5 mg by mouth 2 (two) times daily. 05/16/23   [provider]  conjugated estrogens (PREMARIN) vaginal cream Place 1 applicator vaginally daily as needed (for dryness). 05/23/23 05/22/24  [provider]  ELIQUIS  5 MG TABS tablet Take 5 mg by mouth 2 (two) times daily. Patient not taking: Reported on 08/09/2023 05/16/23   [provider]  esomeprazole (NEXIUM) 40 MG capsule Take 40 mg by mouth 2 (two) times daily before a meal. 09/27/23   [provider]  famotidine  (PEPCID ) 20 MG tablet Take 1 tablet (20 mg total) by mouth 2 (two) times daily. 06/21/23   Mesner, Selinda, MD  gabapentin  (NEURONTIN ) 300 MG capsule Take 300 mg by mouth at bedtime.    [provider]   hydrOXYzine  (ATARAX ) 25 MG tablet Take 25 mg by mouth daily as needed for itching. 05/16/23   [provider]  Insulin  Aspart FlexPen (NOVOLOG ) 100 UNIT/ML inject 4-10 units  under the skin with meals as directed.  Max 30 units per day 05/19/23   [provider]  insulin  degludec (TRESIBA ) 100 UNIT/ML FlexTouch Pen Inject 30 Units into the skin daily. 05/19/23 05/13/24  [provider]  methocarbamol  (ROBAXIN ) 500 MG tablet Take 1 tablet (500 mg total) by mouth every 8 (eight) hours as needed for muscle spasms. 07/14/23   Harden Jerona GAILS, MD  mycophenolate  (CELLCEPT ) 250 MG capsule Take 1 capsule (250 mg total) by mouth 2 (two) times daily. Please resume your home dose of 750 twice daily dose once you have finished your deficit treatment on 06/18/2023. 06/14/23   Singh, Prashant K, MD  NIFEdipine  (PROCARDIA -XL/NIFEDICAL-XL) 30 MG 24 hr tablet Take 30 mg by mouth daily. Patient not taking: Reported on 08/09/2023 05/19/23 05/18/24  [provider]  oxyCODONE  (ROXICODONE ) 5 MG immediate release tablet Take 1 tablet (5 mg total) by mouth every 4 (four) hours as needed for up to 2 doses for severe pain (pain score 7-10). 09/29/23   Hoy Nidia FALCON, PA-C  pravastatin  (PRAVACHOL ) 40 MG tablet Take 40 mg by mouth daily. 05/16/23   [provider]  predniSONE  (DELTASONE ) 5 MG tablet Take 5 mg by mouth daily with breakfast.    [provider]  promethazine  (PHENERGAN ) 25 MG suppository Place 1 suppository (25 mg  total) rectally every 6 (six) hours as needed for nausea or vomiting. Patient not taking: Reported on 08/09/2023 06/21/23   Mesner, Selinda, MD  promethazine  (PHENERGAN ) 25 MG tablet Take 1 tablet (25 mg total) by mouth every 6 (six) hours as needed for nausea or vomiting. Patient not taking: Reported on 08/09/2023 06/21/23   Mesner, Selinda, MD  sucralfate  (CARAFATE ) 1 g tablet Take 1 tablet (1 g total) by mouth 4 (four) times daily -  with meals and at bedtime for 15 days.  06/21/23 07/06/23  Mesner, Selinda, MD  sulfamethoxazole -trimethoprim  (BACTRIM ) 400-80 MG tablet Take 1 tablet by mouth 3 (three) times a week. Monday, Wednesday and Fridays 05/16/23   [provider]  tacrolimus  (PROGRAF ) 5 MG capsule Take 1 capsule (5 mg total) by mouth 2 (two) times daily. 06/10/23   Rosario Leatrice FERNS, MD  valGANciclovir  (VALCYTE ) 450 MG tablet Take 1 tablet (450 mg total) by mouth daily. Patient not taking: Reported on 08/09/2023 06/11/23   Rosario Leatrice I, MD    Allergies: Diphenhydramine, Peanut-containing drug products, Contrast media [iodinated contrast media], Shellfish allergy, Chlorhexidine , Ferrous sulfate, and Iron  dextran    Review of Systems  Neurological:  Positive for dizziness.    Updated Vital Signs BP 119/82   Pulse (!) 101   Temp 98.7 F (37.1 C) (Oral)   Resp 16   Ht 5' 6 (1.676 m)   Wt 80.3 kg   SpO2 98%   BMI 28.57 kg/m   Physical Exam Vitals and nursing note reviewed.  Constitutional:      Appearance: She is well-developed.     Comments: Tearful.  HENT:     Head: Normocephalic.  Cardiovascular:     Rate and Rhythm: Regular rhythm. Tachycardia present.     Heart sounds: No murmur heard. Pulmonary:     Effort: Pulmonary effort is normal.     Breath sounds: Normal breath sounds. No wheezing, rhonchi or rales.  Abdominal:     Palpations: Abdomen is soft.     Tenderness: There is abdominal tenderness (mild epigastric tenderness.). There is no guarding or rebound.  Musculoskeletal:        General: Normal range of motion.     Cervical back: Normal range of motion and neck supple.     Right lower leg: No edema.     Left lower leg: No edema.  Skin:    General: Skin is warm and dry.     Coloration: Skin is not jaundiced.  Neurological:     General: No focal deficit present.     Mental Status: She is alert and oriented to person, place, and time.     (all labs ordered are listed, but only abnormal results are displayed) Labs  Reviewed  BASIC METABOLIC PANEL WITH GFR - Abnormal; Notable for the following components:      Result Value   CO2 13 (*)    Glucose, Bld 232 (*)    BUN 37 (*)    Creatinine, Ser 3.76 (*)    Calcium  7.3 (*)    GFR, Estimated 15 (*)    All other components within normal limits  CBC - Abnormal; Notable for the following components:   WBC 2.6 (*)    RBC 3.16 (*)    Hemoglobin 9.2 (*)    HCT 28.3 (*)    All other components within normal limits  TROPONIN T, HIGH SENSITIVITY - Abnormal; Notable for the following components:   Troponin T High Sensitivity 32 (*)  All other components within normal limits  RESP PANEL BY RT-PCR (RSV, FLU A&B, COVID)  RVPGX2  PREGNANCY, URINE  URINALYSIS, ROUTINE W REFLEX MICROSCOPIC  HEPATIC FUNCTION PANEL  LIPASE, BLOOD  TROPONIN T, HIGH SENSITIVITY    EKG: EKG Interpretation Date/Time:  Monday October 31 2023 21:25:01 EDT Ventricular Rate:  102 PR Interval:  136 QRS Duration:  74 QT Interval:  358 QTC Calculation: 466 R Axis:   86  Text Interpretation: Sinus tachycardia Otherwise normal ECG When compared with ECG of 29-Sep-2023 15:46, PREVIOUS ECG IS PRESENT Confirmed by Jerrol Agent ([id number]) on 10/31/2023 11:42:14 PM  Radiology: [facility] Chest Port 1 View Result Date: 10/31/2023 CLINICAL DATA:  Shortness of breath EXAM: PORTABLE CHEST 1 VIEW COMPARISON:  05/07/2023 FINDINGS: The heart size and mediastinal contours are within normal limits. Both lungs are clear. The visualized skeletal structures are unremarkable. IMPRESSION: No active disease. Electronically Signed   By: Franky Crease M.D.   On: 10/31/2023 23:16     Procedures   Medications Ordered in the ED  prochlorperazine  (COMPAZINE ) injection 10 mg (10 mg Intravenous Given 10/31/23 2248)  HYDROmorphone  (DILAUDID ) injection 0.5 mg (0.5 mg Intravenous Given 11/01/23 0026)    Clinical Course as of 11/01/23 0046  Tue Nov 01, 2023  0044 Patient with history of renal transplant here with symptoms  that started today of feeling unwell, chills, frontal headache, vomiting. No fever, respiratory symptoms. She is on her last day of abx for UTI. Dilaudid  and Compazine  provided for symptomatic relief.   Patient care signed out to Dr. Geroldine pending lab studies, including urine, and re-evaluation of symptoms.  [SU]    Clinical Course User Index [SU] Odell Balls, PA-C                                 Medical Decision Making Amount and/or Complexity of Data Reviewed Labs: ordered.  Risk Prescription drug management.        Final diagnoses:  Feeling unwell  History of kidney transplant    ED Discharge Orders     None          Odell Balls, PA-C 11/01/23 0047    Geroldine Berg, MD 11/01/23 [phone]

## 2023-11-02 ENCOUNTER — Encounter (HOSPITAL_COMMUNITY): Payer: Self-pay | Admitting: Emergency Medicine

## 2023-11-02 ENCOUNTER — Emergency Department (HOSPITAL_COMMUNITY): Admission: EM | Admit: 2023-11-02 | Discharge: 2023-11-02 | Discharge status: Against Medical Advice

## 2023-11-02 ENCOUNTER — Other Ambulatory Visit: Payer: Self-pay

## 2023-11-02 ENCOUNTER — Emergency Department (HOSPITAL_COMMUNITY)
Admission: EM | Admit: 2023-11-02 | Discharge: 2023-11-02 | Discharge status: Against Medical Advice | Attending: Emergency Medicine | Admitting: Emergency Medicine

## 2023-11-02 DIAGNOSIS — R109 Unspecified abdominal pain: Secondary | ICD-10-CM | POA: Insufficient documentation

## 2023-11-02 DIAGNOSIS — Z5321 Procedure and treatment not carried out due to patient leaving prior to being seen by health care provider: Secondary | ICD-10-CM | POA: Diagnosis not present

## 2023-11-02 NOTE — ED Triage Notes (Signed)
 Pt c/o esophagus pain that started last night, pt states that she has a mini hernia and her esophagus hurts. Covid +. Scheduled for endo tomorrow.

## 2023-11-02 NOTE — ED Notes (Signed)
 Pt walked out stating she didn't want to stay here.

## 2023-11-03 ENCOUNTER — Emergency Department (HOSPITAL_BASED_OUTPATIENT_CLINIC_OR_DEPARTMENT_OTHER)
Admission: EM | Admit: 2023-11-03 | Discharge: 2023-11-04 | Disposition: A | Discharge status: Home / Self Care | Attending: Emergency Medicine | Admitting: Emergency Medicine

## 2023-11-03 ENCOUNTER — Encounter (HOSPITAL_BASED_OUTPATIENT_CLINIC_OR_DEPARTMENT_OTHER): Payer: Self-pay | Admitting: Emergency Medicine

## 2023-11-03 ENCOUNTER — Other Ambulatory Visit: Payer: Self-pay

## 2023-11-03 DIAGNOSIS — E1122 Type 2 diabetes mellitus with diabetic chronic kidney disease: Secondary | ICD-10-CM | POA: Insufficient documentation

## 2023-11-03 DIAGNOSIS — I129 Hypertensive chronic kidney disease with stage 1 through stage 4 chronic kidney disease, or unspecified chronic kidney disease: Secondary | ICD-10-CM | POA: Diagnosis not present

## 2023-11-03 DIAGNOSIS — Z7901 Long term (current) use of anticoagulants: Secondary | ICD-10-CM | POA: Diagnosis not present

## 2023-11-03 DIAGNOSIS — R070 Pain in throat: Secondary | ICD-10-CM | POA: Insufficient documentation

## 2023-11-03 DIAGNOSIS — N189 Chronic kidney disease, unspecified: Secondary | ICD-10-CM | POA: Insufficient documentation

## 2023-11-03 DIAGNOSIS — Z79899 Other long term (current) drug therapy: Secondary | ICD-10-CM | POA: Diagnosis not present

## 2023-11-03 DIAGNOSIS — Z794 Long term (current) use of insulin: Secondary | ICD-10-CM | POA: Insufficient documentation

## 2023-11-03 DIAGNOSIS — R1013 Epigastric pain: Secondary | ICD-10-CM | POA: Insufficient documentation

## 2023-11-03 LAB — CBG MONITORING, ED: Glucose-Capillary: 219 mg/dL — ABNORMAL HIGH (ref 70–99)

## 2023-11-03 NOTE — ED Triage Notes (Signed)
 [facility] endo this AM- possible esophageal dilation. Sent home with pain medication. Complains of throat pain- tearful in triage- at [facility] earlier today left due to wait time. COVID+ 8/19.

## 2023-11-03 NOTE — Care Plan (Signed)
  Problem: Hemodynamic Status Goal: Hemodynamic status to baseline/discharge criteria met Outcome: Progressing   Problem: Safety & Psycho Social Goal: Patient returns to baseline activity/meets discharge criteria Outcome: Progressing   Problem: Nutrition Goal: Patient to baseline / improved nutrition Outcome: Progressing   Problem: Lab & Diagnostics Goal: Additional tests per physician orders Outcome: Progressing   Problem: Respiratory Goal: SaO2 sat, airway patency, cough mechanism maintained Outcome: Progressing   Problem: Risk for Injury Goal: The patient will function at optimal level and be free of injury Outcome: Progressing Goal: Pt is free from signs/symptoms of injury r/t positioning Outcome: Progressing Goal: Patient will tolerate positioning interventions Outcome: Progressing   Problem: Risk for Falls Goal: Pt will remain free of falls Outcome: Progressing Goal: Patient remains free of injury and or falls Outcome: Progressing Goal: Patient will mobilize safely without falling Outcome: Progressing

## 2023-11-03 NOTE — Progress Notes (Signed)
 Pt arrived to recovery via stretcher from procedure. Report received from RN & CRNA. Airway patent, abdomen soft, non-tender, IV patent, denied pain. Pt connected to monitor; VS monitored per protocol. Family brought back to recovery; d/c instructions reviewed.  MD in to assess patient and speak with patient and family regarding procedure and outcomes. IV removed prior to release.  Pt d/c to home via wheelchair with family member, in stable condition.

## 2023-11-03 NOTE — Progress Notes (Signed)
 Anesthesia Transfer of Care Note  Patient: [name]  Procedures performed: EGD - Upper Endoscopy with Possible Dilation ESOPHAGOGASTRODUODENOSCOPY, FLEXIBLE, TRANSORAL; WITH BIOPSY, SINGLE OR MULTIPLE  Report given/handover to: PACU Nurse

## 2023-11-03 NOTE — Anesthesia Postprocedure Evaluation (Signed)
 Discharge from Anesthesia Care  Patient: [name]  Procedures performed: EGD - Upper Endoscopy with Possible Dilation ESOPHAGOGASTRODUODENOSCOPY, FLEXIBLE, TRANSORAL; WITH BIOPSY, SINGLE OR MULTIPLE  Anesthesia type: No value filed. Vitals Value Taken Time  BP 146/112 11/03/23 13:43  Temp 36.7 C (98 F) 11/03/23 12:58  Pulse 96 11/03/23 13:43  Resp 12 11/03/23 13:43  SpO2 100 % 11/03/23 13:44  Vitals shown include unfiled device data. *If vital value is absent from grid, please refer to corresponding flowsheet vitals data  Anesthesia Post Evaluation  Patient participation: complete - patient participated Level of consciousness: awake and alert Pain management: adequate Airway patency: patent Respiratory status: acceptable Cardiovascular status: acceptable Hydration status: acceptable    Recent Flowsheet Information: Vitals Value Taken Time  Respiratory (WDL) WDL 11/03/23 13:08  Respiratory Pattern    Musculoskeletal (WDL)    Neuro (WDL) WDL 11/03/23 13:08  Level of Consciousness    Orientation Level    Pain Assessment %% 0-10 11/03/23 13:41  Pain Score Eight 11/03/23 13:41  Wong-Baker FACES Pain Rating    Nausea Status    Vomiting Status    Gastrointestinal (WDL) WDL 11/03/23 13:08  GI Symptoms

## 2023-11-03 NOTE — Anesthesia Preprocedure Evaluation (Addendum)
 PASS-Perioperative Anesthesia & Surgical Screening Patient Alerts: Procedure:    Date/Time: 11/03/23 1200   Procedure: EGD - Upper Endoscopy with Possible Dilation   Location: [facility] / [facility]   Providers: Dufault, Darin Lyn, MD      CC/HPI  [name] is a 38 y.o. female presents for above  Medical History  Past Medical History  Pulmonary .  URI in last 4 weeks (COVID, patient states symptoms are resloving) Denies breathing problems     Cardiovascular  .  Exercise tolerance: >4 METS (walks dog, plays with niece/nephew) .  HTN: controlled   Denies CP, SOB, palpitations, syncope or near syncope. Can climb 12 steps with no SOB or chest pain.      Gastrointestinal .  GERD:, poorly controlled, with medication .  Esophagitis .  Esophageal disease: Stricture Gastroparesis   EGD 07/07/23: LA Grade A esophagitis.                        - Benign-appearing esophageal stenosis.                        - 3 cm hiatal hernia.                        - Normal examined duodenum.                        - The pylorus successfully injected with botulinum                         toxin.                        - No specimens collected.     GU/Renal .  Chronic kidney disease: Stage 3 CKD .  Kidney transplant:2007, 02/2023 10/14/2023: Crt 4.36; GFR 13   Followed by nephrology, Dr. Melba      Neuro/Spine/CNS .  Seizure disorder (one seizure in setting of hyperglycemia 2022 - no recurrence):  Hematology/Oncology  .  Anemia (10/14/2023: H/H 9.3/26.7): chronic .  DVT (Resolved, no longer on any medications) .  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  Chronic steroid use: Currently using .  Diabetes (Has a Dexcom), DM Type 1:  A1C 12.7  (H) 05/18/2023    Parathyroidectomy 2007   Followed by endocrinology, last visit 09/06/23.        GYN  Pregnancy status: no .  Has never had menstrual cycle  No LMP recorded. (Menstrual status: Unknown).     Past Surgical History   Past Surgical History:  Procedure Laterality Date  . AV graft Left    not functional  . AV graft Right    not functional  . EGD N/A 12/23/2022   Procedure: EGD - Upper Endoscopy with Possible Dilation;  Surgeon: Dufault, Darin Lyn, MD;  Location: [facility];  Service: Gastroenterology;  Laterality: N/A;  . ESOPHAGEAL DILATION    . ESOPHAGOGASTRODOUDENOSCOPY W/DILATION N/A 11/29/2022   Procedure: EGD - Upper Endoscopy with Possible Dilation;  Surgeon: Dufault, Darin Lyn, MD;  Location: [facility];  Service: Gastroenterology;  Laterality: N/A;  . ESOPHAGOGASTRODOUDENOSCOPY W/DILATION N/A 09/22/2023   Procedure: ESOPHAGOGASTRODUODENOSCOPY, FLEXIBLE, TRANSORAL; WITH TRANSENDOSCOPIC BALLOON DILATION OF ESOPHAGUS (LESS THAN 30 MM DIAMETER);  Surgeon: Dufault, Darin Lyn, MD;  Location: [facility];  Service: Gastroenterology;  Laterality: N/A;  . CORI W/INJECTION N/A 07/07/2023   Procedure: ESOPHAGOGASTRODUODENOSCOPY, FLEXIBLE, TRANSORAL; WITH DIRECTED SUBMUCOSAL INJECTION(S), ANY SUBSTANCE;  Surgeon: Dufault, Darin Lyn, MD;  Location: [facility];  Service: Gastroenterology;  Laterality: N/A;  . LAPAROSCOPIC CHOLECYSTECTOMY  06/2017  . PARATHYROIDECTOMY  2007  . TRANSPLANT KIDNEY  2007    Social/Family History   Social History   Occupational History  . Occupation: disability  Tobacco Use  . Smoking status: Never  . Smokeless tobacco: Never  Vaping Use  . Vaping status: Never Used  Substance and Sexual Activity  . Alcohol use: Never  . Drug use: Never  . Sexual activity: Defer   Vaping/E-Cigarettes  . Vaping/E-Cigarette Use Never User   . Start Date    . Cartridges/Day    .  Quit Date     Family History  Problem Relation Age of Onset  . Cerebral aneurysm Mother   . Dementia Father   . Heart failure Father   . Anesthesia problems Neg Hx    Allergies   Allergies  Allergen Reactions  . Diphenhydramine Shortness Of Breath  . Banana Itching and Other (See Comments)    Sick on the stomach   . Ferrous Sulfate Itching  . Iron  Dextran Itching    vein irritation    . Motrin [Ibuprofen] Itching  . Shellfish Containing Products Hives   Medications   No current facility-administered medications for this encounter.   Physical Exam  Phys Exam Test Results    Lab Results  Component Value Date   HGBA1C 12.5 (H) 08/25/2023        A1C  (Last 365 days)      06/12 1034 03/05 1226 01/29 1236   A1C           12.5*                  12.7*                  10.2*             Patient Instructions   Problem List   Patient Active Problem List  Diagnosis  . Anemia in chronic renal disease  . Essential hypertension, benign  . History of kidney transplant (HHS-HCC)  . Immunosuppressed status (CMS/HHS-HCC)  . Intractable nausea and vomiting  . Learning disability  . 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 Premature menopause  . Secondary hyperparathyroidism (CMS/HHS-HCC)  . Type 1 diabetes mellitus with complication, uncontrolled  . Diabetic foot ulcer with osteomyelitis (CMS/HHS-HCC)  . Pre-transplant evaluation for kidney transplant  . End-stage renal disease on hemodialysis (CMS/HHS-HCC)  . Upper abdominal pain  . Gastroesophageal reflux disease without esophagitis  . Dysphagia  . Type 1 diabetes mellitus with diabetic chronic kidney disease (CMS/HHS-HCC)   Assessment & Plan  Plan  Day of Surgery  Airway: Mallampati: II - soft palate, uvula, fauces visible. TM distance: >3 FB. Mouth opening: Normal. Neck ROM: Full. Dental: Normal. Simple airway Cardiovasc: Rhythm: Regular. Rate: Normal.    Pulmonary: Normal  Plan ASA: 3 Plan: mac.  Postoperative Opioids Intended? No Induction: Intravenous.  Maintenance: Intravenous sedation, Total intravenous anesthesia.   Patient identification and NPO status confirmed, chart reviewed (medical history, including anesthesia,  drug, and allergy history), and patient examined. Anesthesia care plan, including plan for postoperative pain control, analgesia regimen, and postoperative disposition discussed with patient and/or parent/legal guardian prior to start of anesthesia care. Patient and/or parent/legal guardian understand that there are anesthetic risks associated with this procedure and wishes to proceed..                                                                                                                                                                                                                   *Some images could not be shown.

## 2023-11-03 NOTE — Care Plan (Signed)
  Problem: Hemodynamic Status Goal: Hemodynamic status to baseline/discharge criteria met Outcome: Met/ Completed   Problem: Safety & Psycho Social Goal: Patient returns to baseline activity/meets discharge criteria Outcome: Met/ Completed   Problem: Nutrition Goal: Patient to baseline / improved nutrition Outcome: Met/ Completed   Problem: Lab & Diagnostics Goal: Additional tests per physician orders Outcome: Met/ Completed   Problem: Respiratory Goal: SaO2 sat, airway patency, cough mechanism maintained Outcome: Met/ Completed   Problem: Knowledge Deficit, Education, Discharge Plan Goal: Patient/S.O.verbalize understanding of procedure/DC instruct Outcome: Met/ Completed   Problem: Pain Management Goal: Patient return to pre procedure comfort Outcome: Met/ Completed   Problem: Risk for aspiration r/t sedation/anesthesia or medication administration Goal: Patient verbalizes understanding of risk of aspiration Outcome: Met/ Completed Goal: Patient will remain free of signs of aspiration and the risk of aspiration is decreased Outcome: Met/ Completed   Problem: Risk for Injury Goal: The patient will function at optimal level and be free of injury Outcome: Met/ Completed Goal: Pt is free from signs/symptoms of injury r/t positioning Outcome: Met/ Completed Goal: Patient will tolerate positioning interventions Outcome: Met/ Completed   Problem: Risk for Falls Goal: Pt will remain free of falls Outcome: Met/ Completed Goal: Patient remains free of injury and or falls Outcome: Met/ Completed Goal: Patient will mobilize safely without falling Outcome: Met/ Completed

## 2023-11-03 NOTE — H&P (Signed)
 GI PRE-PROCEDURE HISTORY AND PHYSICAL   [name] presents for the following scheduled GI procedure(s):  Upper Endoscopy  Therapeutic indications: Dilation.   Scheduled Procedure(s): EGD - Upper Endoscopy with Possible Dilation.   The indication for the procedure(s) is Esophageal stenosis [K22.2].     I have reviewed the patient's history and physical and I have re-examined the patient prior to the procedure. There have been no significant recent changes in the patient's medical status.  History: Past Medical History:  Diagnosis Date  . Diabetes mellitus without complication (CMS/HHS-HCC)   . ESRD (end stage renal disease) (CMS/HHS-HCC)   . FSGS (focal segmental glomerulosclerosis)   . Hypertension     Past Surgical History:  Procedure Laterality Date  . TRANSPLANT KIDNEY  2007  . PARATHYROIDECTOMY  2007  . LAPAROSCOPIC CHOLECYSTECTOMY  06/2017  . ESOPHAGOGASTRODOUDENOSCOPY W/DILATION N/A 11/29/2022   Procedure: EGD - Upper Endoscopy with Possible Dilation;  Surgeon: Dufault, Darin Lyn, MD;  Location: [facility];  Service: Gastroenterology;  Laterality: N/A;  . EGD N/A 12/23/2022   Procedure: EGD - Upper Endoscopy with Possible Dilation;  Surgeon: Dufault, Darin Lyn, MD;  Location: [facility];  Service: Gastroenterology;  Laterality: N/A;  . CORI W/INJECTION N/A 07/07/2023   Procedure: ESOPHAGOGASTRODUODENOSCOPY, FLEXIBLE, TRANSORAL; WITH DIRECTED SUBMUCOSAL INJECTION(S), ANY SUBSTANCE;  Surgeon: Dufault, Darin Lyn, MD;  Location: [facility];  Service: Gastroenterology;  Laterality: N/A;  . ESOPHAGOGASTRODOUDENOSCOPY W/DILATION N/A 09/22/2023   Procedure: ESOPHAGOGASTRODUODENOSCOPY, FLEXIBLE, TRANSORAL; WITH TRANSENDOSCOPIC BALLOON DILATION OF ESOPHAGUS (LESS THAN 30 MM DIAMETER);  Surgeon: Dufault, Darin Lyn, MD;  Location: [facility];  Service: Gastroenterology;  Laterality: N/A;  . AV graft Left    not functional   . AV graft Right    not functional  . ESOPHAGEAL DILATION      Allergies Allergies  Allergen Reactions  . Diphenhydramine Shortness Of Breath  . Banana Itching and Other (See Comments)    Sick on the stomach   . Ferrous Sulfate Itching  . Iron  Dextran Itching    vein irritation    . Motrin [Ibuprofen] Itching  . Shellfish Containing Products Hives    Medications DEXCOM G7 SENSOR, HYDROcodone -acetaminophen , NIFEdipine , acetaminophen , aspirin , calcitRIOL , calcium  carbonate, carvediloL , cetirizine , esomeprazole, famotidine , fluticasone  propionate, gabapentin , hydrOXYzine , insulin  aspart, insulin  degludec, lidocaine -prilocaine , loperamide , magnesium  oxide, mycophenolate , predniSONE , prochlorperazine , sucralfate , tacrolimus , tiZANidine , and valGANciclovir   Recent Anticoagulant or Antiplatelet Use The patient is prescribed no previous anticoagulant or antiplatelet agents.  Prophylactic Antibiotics The patient does not require prophylactic antibiotics  Physical Examination BP (!) 171/121   Pulse (!) 112   Temp 36.8 C (98.3 F) (Oral)   Resp 14   Ht 167.6 cm (5' 6)   Wt 80.3 kg (177 lb)   SpO2 100%   BMI 28.57 kg/m  Mental Status: normal Airway: normal oropharyngeal airway and neck mobility Lungs: clear to auscultation Heart: RRR, no murmurs, no S3 or S4 Abdomen: soft, nondistended, mild epigastric tenderness to palpation, per patient at her baseline chronic pain  ASSESSMENT AND PLAN  [name] has been evaluated prior to the planned procedure and deemed appropriate to undergo a Procedure(s): EGD - Upper Endoscopy with Possible Dilation.

## 2023-11-04 ENCOUNTER — Emergency Department (HOSPITAL_BASED_OUTPATIENT_CLINIC_OR_DEPARTMENT_OTHER)

## 2023-11-04 DIAGNOSIS — R1013 Epigastric pain: Secondary | ICD-10-CM | POA: Diagnosis not present

## 2023-11-04 LAB — COMPREHENSIVE METABOLIC PANEL WITH GFR
ALT: 8 U/L (ref 0–44)
AST: 16 U/L (ref 15–41)
Albumin: 4.4 g/dL (ref 3.5–5.0)
Alkaline Phosphatase: 59 U/L (ref 38–126)
Anion gap: 15 (ref 5–15)
BUN: 34 mg/dL — ABNORMAL HIGH (ref 6–20)
CO2: 16 mmol/L — ABNORMAL LOW (ref 22–32)
Calcium: 7.1 mg/dL — ABNORMAL LOW (ref 8.9–10.3)
Chloride: 103 mmol/L (ref 98–111)
Creatinine, Ser: 3.38 mg/dL — ABNORMAL HIGH (ref 0.44–1.00)
GFR, Estimated: 17 mL/min — ABNORMAL LOW (ref >60)
Glucose, Bld: 224 mg/dL — ABNORMAL HIGH (ref 70–99)
Potassium: 4.8 mmol/L (ref 3.5–5.1)
Sodium: 134 mmol/L — ABNORMAL LOW (ref 135–145)
Total Bilirubin: 0.4 mg/dL (ref 0.0–1.2)
Total Protein: 8.2 g/dL — ABNORMAL HIGH (ref 6.5–8.1)

## 2023-11-04 LAB — CBC WITH DIFFERENTIAL/PLATELET
Abs Immature Granulocytes: 0.03 K/uL (ref 0.00–0.07)
Basophils Absolute: 0.1 K/uL (ref 0.0–0.1)
Basophils Relative: 1 %
Eosinophils Absolute: 0 K/uL (ref 0.0–0.5)
Eosinophils Relative: 0 %
HCT: 26.9 % — ABNORMAL LOW (ref 36.0–46.0)
Hemoglobin: 8.9 g/dL — ABNORMAL LOW (ref 12.0–15.0)
Immature Granulocytes: 1 %
Lymphocytes Relative: 9 %
Lymphs Abs: 0.4 K/uL — ABNORMAL LOW (ref 0.7–4.0)
MCH: 29 pg (ref 26.0–34.0)
MCHC: 33.1 g/dL (ref 30.0–36.0)
MCV: 87.6 fL (ref 80.0–100.0)
Monocytes Absolute: 0.3 K/uL (ref 0.1–1.0)
Monocytes Relative: 6 %
Neutro Abs: 3.9 K/uL (ref 1.7–7.7)
Neutrophils Relative %: 83 %
Platelets: 269 K/uL (ref 150–400)
RBC: 3.07 MIL/uL — ABNORMAL LOW (ref 3.87–5.11)
RDW: 14 % (ref 11.5–15.5)
WBC: 4.7 K/uL (ref 4.0–10.5)
nRBC: 0 % (ref 0.0–0.2)

## 2023-11-04 LAB — LIPASE, BLOOD: Lipase: 48 U/L (ref 11–51)

## 2023-11-04 MED ORDER — SUCRALFATE 1 G PO TABS
1.0000 g | ORAL_TABLET | Freq: Once | ORAL | Status: AC
  Administered 2023-11-04: 1 g via ORAL
  Filled 2023-11-04: qty 1

## 2023-11-04 MED ORDER — DROPERIDOL 2.5 MG/ML IJ SOLN
1.2500 mg | Freq: Once | INTRAMUSCULAR | Status: AC
  Administered 2023-11-04: 1.25 mg via INTRAVENOUS
  Filled 2023-11-04: qty 2

## 2023-11-04 MED ORDER — MORPHINE SULFATE (PF) 4 MG/ML IV SOLN
4.0000 mg | Freq: Once | INTRAVENOUS | Status: AC
  Administered 2023-11-04: 4 mg via INTRAVENOUS
  Filled 2023-11-04: qty 1

## 2023-11-04 MED ORDER — SODIUM CHLORIDE 0.9 % IV BOLUS
1000.0000 mL | Freq: Once | INTRAVENOUS | Status: AC
  Administered 2023-11-04: 1000 mL via INTRAVENOUS

## 2023-11-04 MED ORDER — PANTOPRAZOLE SODIUM 40 MG IV SOLR
40.0000 mg | Freq: Once | INTRAVENOUS | Status: AC
  Administered 2023-11-04: 40 mg via INTRAVENOUS
  Filled 2023-11-04: qty 10

## 2023-11-04 MED ORDER — OXYCODONE HCL 5 MG PO TABS
10.0000 mg | ORAL_TABLET | ORAL | Status: DC | PRN
  Administered 2023-11-04: 10 mg via ORAL
  Filled 2023-11-04: qty 2

## 2023-11-04 NOTE — Discharge Instructions (Signed)
 You were seen today for ongoing epigastric pain.  There does not appear to be any complication from your recent procedure.  This is likely related to your ongoing esophagitis.  Continue Carafate  and Nexium at home per gastroenterology.  You can also continue your oxycodone .  Follow-up with [facility] gastro[facility]ology for ongoing needs.

## 2023-11-04 NOTE — ED Provider Notes (Signed)
 [place] EMERGENCY DEPARTMENT AT [facility] Provider Note   CSN: [id number] Arrival date & time: 11/03/23  2337     Patient presents with: Throat pain   [name] is a 38 y.o. female.   HPI     This is a 38 year old female with history of kidney transplant, significant history of esophagitis who presents with ongoing epigastric pain.  Patient was seen and evaluated on 8/19 for the same.  She subsequently underwent EGD on 8/21 at [facility].  She was found to have a stricture and was dilated.  She states she was given Dilaudid  postoperatively but had continued pain.  She reports burning epigastric pain that is sharp.  She also recently was diagnosed with COVID.  Patient has taken her oxycodone  at home with minimal relief.  Has nausea without vomiting.  Patient has had several recent ED visits with similar symptoms including 8/18, 8/19 , 8/20 (not seen).   Prior to Admission medications   Medication Sig Start Date End Date Taking? Authorizing Provider  calcitRIOL  (ROCALTROL ) 0.5 MCG capsule Take 0.5 mcg by mouth daily. 05/16/23   [provider]  Calcium  Carbonate Antacid (CALCIUM  CARBONATE, DOSED IN MG ELEMENTAL CALCIUM ,) 1250 MG/5ML SUSP Take 500 mg of elemental calcium  by mouth 3 (three) times daily. 05/15/23   [provider]  carvedilol  (COREG ) 12.5 MG tablet Take 12.5 mg by mouth 2 (two) times daily. 05/16/23   [provider]  conjugated estrogens (PREMARIN) vaginal cream Place 1 applicator vaginally daily as needed (for dryness). 05/23/23 05/22/24  [provider]  ELIQUIS  5 MG TABS tablet Take 5 mg by mouth 2 (two) times daily. Patient not taking: Reported on 08/09/2023 05/16/23   [provider]  esomeprazole (NEXIUM) 40 MG capsule Take 40 mg by mouth 2 (two) times daily before a meal. 09/27/23   [provider]  famotidine  (PEPCID ) 20 MG tablet Take 1 tablet (20 mg total) by mouth 2 (two) times daily. 06/21/23   Mesner, Selinda,  MD  gabapentin  (NEURONTIN ) 300 MG capsule Take 300 mg by mouth at bedtime.    [provider]  hydrOXYzine  (ATARAX ) 25 MG tablet Take 25 mg by mouth daily as needed for itching. 05/16/23   [provider]  Insulin  Aspart FlexPen (NOVOLOG ) 100 UNIT/ML inject 4-10 units  under the skin with meals as directed.  Max 30 units per day 05/19/23   [provider]  insulin  degludec (TRESIBA ) 100 UNIT/ML FlexTouch Pen Inject 30 Units into the skin daily. 05/19/23 05/13/24  [provider]  methocarbamol  (ROBAXIN ) 500 MG tablet Take 1 tablet (500 mg total) by mouth every 8 (eight) hours as needed for muscle spasms. 07/14/23   Harden Jerona GAILS, MD  metoCLOPramide  (REGLAN ) 10 MG tablet Take 1 tablet (10 mg total) by mouth every 6 (six) hours. 11/01/23   Garrick Charleston, MD  mycophenolate  (CELLCEPT ) 250 MG capsule Take 1 capsule (250 mg total) by mouth 2 (two) times daily. Please resume your home dose of 750 twice daily dose once you have finished your deficit treatment on 06/18/2023. 06/14/23   Singh, Prashant K, MD  NIFEdipine  (PROCARDIA -XL/NIFEDICAL-XL) 30 MG 24 hr tablet Take 30 mg by mouth daily. Patient not taking: Reported on 08/09/2023 05/19/23 05/18/24  [provider]  ondansetron  (ZOFRAN -ODT) 4 MG disintegrating tablet Take 1 tablet (4 mg total) by mouth every 8 (eight) hours as needed for nausea or vomiting. 11/01/23   Garrick Charleston, MD  oxyCODONE  (ROXICODONE ) 5 MG immediate release tablet  Take 1 tablet (5 mg total) by mouth every 4 (four) hours as needed for severe pain (pain score 7-10). 11/01/23   Garrick Charleston, MD  pravastatin  (PRAVACHOL ) 40 MG tablet Take 40 mg by mouth daily. 05/16/23   [provider]  predniSONE  (DELTASONE ) 5 MG tablet Take 5 mg by mouth daily with breakfast.    [provider]  promethazine  (PHENERGAN ) 25 MG suppository Place 1 suppository (25 mg total) rectally every 6 (six) hours as needed for nausea or vomiting. Patient not taking:  Reported on 08/09/2023 06/21/23   Mesner, Selinda, MD  promethazine  (PHENERGAN ) 25 MG tablet Take 1 tablet (25 mg total) by mouth every 6 (six) hours as needed for nausea or vomiting. Patient not taking: Reported on 08/09/2023 06/21/23   Mesner, Selinda, MD  sucralfate  (CARAFATE ) 1 g tablet Take 1 tablet (1 g total) by mouth 4 (four) times daily -  with meals and at bedtime for 15 days. 06/21/23 07/06/23  Mesner, Selinda, MD  sulfamethoxazole -trimethoprim  (BACTRIM ) 400-80 MG tablet Take 1 tablet by mouth 3 (three) times a week. Monday, Wednesday and Fridays 05/16/23   [provider]  tacrolimus  (PROGRAF ) 5 MG capsule Take 1 capsule (5 mg total) by mouth 2 (two) times daily. 06/10/23   Rosario Leatrice FERNS, MD  valGANciclovir  (VALCYTE ) 450 MG tablet Take 1 tablet (450 mg total) by mouth daily. Patient not taking: Reported on 08/09/2023 06/11/23   Rosario Leatrice I, MD    Allergies: Diphenhydramine, Peanut-containing drug products, Contrast media [iodinated contrast media], Shellfish allergy, Chlorhexidine , Ferrous sulfate, and Iron  dextran    Review of Systems  Constitutional:  Negative for fever.  Respiratory:  Negative for shortness of breath.   Cardiovascular:  Negative for chest pain.  Gastrointestinal:  Positive for abdominal pain and nausea. Negative for vomiting.  All other systems reviewed and are negative.   Updated Vital Signs BP (!) 148/104 (BP Location: Left Wrist)   Pulse (!) 106   Temp 98.1 F (36.7 C) (Oral)   Resp 18   SpO2 100%   Physical Exam Vitals and nursing note reviewed.  Constitutional:      Appearance: She is well-developed. She is ill-appearing. She is not toxic-appearing.  HENT:     Head: Normocephalic and atraumatic.  Eyes:     Pupils: Pupils are equal, round, and reactive to light.  Cardiovascular:     Rate and Rhythm: Normal rate and regular rhythm.     Heart sounds: Normal heart sounds.  Pulmonary:     Effort: Pulmonary effort is normal. No respiratory  distress.     Breath sounds: No wheezing.  Abdominal:     General: Bowel sounds are normal.     Palpations: Abdomen is soft.     Tenderness: There is abdominal tenderness. There is no guarding or rebound.     Comments: Extensive abdominal scarring noted, epigastric tenderness to palpation without rebound or guarding  Musculoskeletal:     Cervical back: Neck supple.  Skin:    General: Skin is warm and dry.  Neurological:     Mental Status: She is alert and oriented to person, place, and time.  Psychiatric:        Mood and Affect: Mood normal.     (all labs ordered are listed, but only abnormal results are displayed) Labs Reviewed  CBC WITH DIFFERENTIAL/PLATELET - Abnormal; Notable for the following components:      Result Value   RBC 3.07 (*)    Hemoglobin 8.9 (*)  HCT 26.9 (*)    Lymphs Abs 0.4 (*)    All other components within normal limits  COMPREHENSIVE METABOLIC PANEL WITH GFR - Abnormal; Notable for the following components:   Sodium 134 (*)    CO2 16 (*)    Glucose, Bld 224 (*)    BUN 34 (*)    Creatinine, Ser 3.38 (*)    Calcium  7.1 (*)    Total Protein 8.2 (*)    GFR, Estimated 17 (*)    All other components within normal limits  CBG MONITORING, ED - Abnormal; Notable for the following components:   Glucose-Capillary 219 (*)    All other components within normal limits  LIPASE, BLOOD    EKG: None  Radiology: CT ABDOMEN PELVIS WO CONTRAST Result Date: 11/04/2023 CLINICAL DATA:  Epigastric pain following EGD. EXAM: CT ABDOMEN AND PELVIS WITHOUT CONTRAST TECHNIQUE: Multidetector CT imaging of the abdomen and pelvis was performed following the standard protocol without IV contrast. RADIATION DOSE REDUCTION: This exam was performed according to the departmental dose-optimization program which includes automated exposure control, adjustment of the mA and/or kV according to patient size and/or use of iterative reconstruction technique. COMPARISON:  Large number of  prior CTs back to 2001. The 2 most recent are both without contrast dated 11/01/2023 and 09/29/2023. FINDINGS: Lower chest: Increased patchy ground-glass opacity both lower lobes which could be due to atelectasis or pneumonia. There is mild cardiomegaly. The cardiac blood pool is less dense than myocardium consistent with anemia. Hepatobiliary: No focal liver abnormality is seen. Status post cholecystectomy. No biliary dilatation. Pancreas: Unremarkable without contrast. Spleen: Unremarkable without contrast. Adrenals/Urinary Tract: There is advanced atrophy of the native left kidney, with scarring. No adrenal mass. Right kidney is absent. There is no urinary stone or obstruction. Renal transplant again noted left iliac fossa without hydronephrosis, stone or evidence of obstruction. Transplant perinephric stranding is similar to the prior studies. No perinephric fluid or blood. Unremarkable bladder. Stomach/Bowel: There is again noted mild thickening in the distal esophageal wall, usually due to reflux. There are clustered hemostatic clips in the gastric antrum which were also seen previously. The contracted stomach is otherwise unremarkable. The small bowel is normal in caliber. There is a normal caliber appendix with scattered stones in the lumen. There is no dilatation or wall thickening of the colon. Vascular/Lymphatic: Scattered calcific plaque noted splenic artery and SMA. No aortic calcification or aneurysm. No lymphadenopathy is seen. Reproductive: Status post hysterectomy. No adnexal masses seen. Postsurgical changes chronically noted right iliac fossa. Other: Postsurgical changes to the abdominal wall, evidence of prior subcutaneous injections. Intact ventral hernia repair. No free fluid, free hemorrhage or free air. Musculoskeletal: No acute or significant osseous findings. IMPRESSION: 1. No acute noncontrast CT abnormality is seen. 2. Increased patchy ground-glass opacity in both lower lobes which could  be due to atelectasis or pneumonia. 3. Cardiomegaly. 4. Anemia. 5. Mild thickening in the distal esophageal wall, usually due to reflux. 6. Left iliac fossa renal transplant without hydronephrosis, stone or fluid collections. 7. Appendicoliths without evidence of appendicitis. 8. Visceral arterial atherosclerosis. Electronically Signed   By: Francis Quam M.D.   On: 11/04/2023 04:46   DG Chest Portable 1 View Result Date: 11/04/2023 CLINICAL DATA:  Epigastric pain. Esophageal dilatation yesterday. COVID positive 11/01/2023. EXAM: PORTABLE CHEST 1 VIEW COMPARISON:  AP Lat chest 11/01/2023 FINDINGS: The heart size and mediastinal contours are within normal limits. Both lungs are clear. The visualized skeletal structures are unremarkable. Again noted are  postsurgical changes in the medial left upper arm. IMPRESSION: No active disease.  Unchanged. Electronically Signed   By: Francis Quam M.D.   On: 11/04/2023 02:49     Procedures   Medications Ordered in the ED  sodium chloride  0.9 % bolus 1,000 mL (0 mLs Intravenous Stopped 11/04/23 0432)  sucralfate  (CARAFATE ) tablet 1 g (1 g Oral Given 11/04/23 0140)  droperidol  (INAPSINE ) 2.5 MG/ML injection 1.25 mg (1.25 mg Intravenous Given 11/04/23 0141)  sodium chloride  0.9 % bolus 1,000 mL (0 mLs Intravenous Stopped 11/04/23 0319)  pantoprazole  (PROTONIX ) injection 40 mg (40 mg Intravenous Given 11/04/23 0317)  morphine  (PF) 4 MG/ML injection 4 mg (4 mg Intravenous Given 11/04/23 0317)    Clinical Course as of 11/04/23 0512  Fri Nov 04, 2023  0507 Discussed workup and plan of care with the patient.  Upon arrival to the room, patient was resting comfortably.  She is very angry for not getting Dilaudid .  She states I told you what would fix me and you did not give it to me.  I discussed with her that my job was to rule out acute complications or medical emergencies.  My suspicion is that this is related to esophagitis which is not primarily treated with  narcotics.  She was given morphine , Carafate , Protonix  to address her pain.  I discussed with her that I was happy to continue to address her discomfort but that I did not think that Dilaudid  was indicated in this case.  She expressed displeasure with me and that I did not care.  I tried to sympathize with her but was firm that I did not believe Dilaudid  was indicated. [CH]  9490 Patient requested discharge. [CH]    Clinical Course User Index [CH] Gisel Vipond, Charmaine FALCON, MD                                 Medical Decision Making Amount and/or Complexity of Data Reviewed Labs: ordered. Radiology: ordered.  Risk Prescription drug management.   This patient presents to the ED for concern of epigastric pain, this involves an extensive number of treatment options, and is a complaint that carries with it a high risk of complications and morbidity.  I considered the following differential and admission for this acute, potentially life threatening condition.  The differential diagnosis includes reflux, anal hernia, complication related to recent dilation  MDM:    This is a 38 year old female who presents with concerns for ongoing epigastric pain.  Has been seen and evaluated multiple times this week for the same.  Also found to be COVID-positive.  Had an esophageal dilation yesterday.  Reports taking her pain medications at home as well as her nausea medication.  Patient has chronic issues with epigastric pain and esophagitis.  Labs obtained.  Mostly at the patient's baseline.  Chronic renal failure and prior renal transplant.  Patient was given Protonix , droperidol , morphine , Carafate , and fluids.  Chest x-ray shows no free air to suggest perforation.  CT scan just shows mild thickening of the distal esophageal wall which is to be expected.  See clinical course above.  On multiple rechecks by nursing and myself, patient is asleep.  However, upon awakening and discussing results with the patient, she was  very displeased that she was not receiving Dilaudid .  I tried to empathize with her but also did not feel that this was indicated.  At this time do not feel  I have further to offer from an emergent standpoint.  Recommend follow-up with your [facility] gastroenterologist.  Initially patient declined further pain medication.  However, discharge requested for further pain control.  She was dosed oxycodone  10 mg.   (Labs, imaging, consults)  Labs: I Ordered, and personally interpreted labs.  The pertinent results include: CBC, CMP, lipase  Imaging Studies ordered: I ordered imaging studies including CT, x-ray I independently visualized and interpreted imaging. I agree with the radiologist interpretation  Additional history obtained from chart review.  External records from outside source obtained and reviewed including and endoscopy notes  Cardiac Monitoring: The patient was maintained on a cardiac monitor.  If on the cardiac monitor, I personally viewed and interpreted the cardiac monitored which showed an underlying rhythm of: Sinus  Reevaluation: After the interventions noted above, I reevaluated the patient and found that they have :stayed the same  Social Determinants of Health:  lives independently  Disposition: Discharge  Co morbidities that complicate the patient evaluation  Past Medical History:  Diagnosis Date   Anemia    Blood transfusion without reported diagnosis    Cellulitis of left leg 03/01/2018   Chronic kidney disease    kidney transplant 07   Diabetes mellitus    Pt reports diagnosis in June 2011, Type 2   Diabetes mellitus without complication (HCC)    Esophageal obstruction due to food impaction    GERD (gastroesophageal reflux disease)    Hyperlipidemia    Hypertension    Intra-abdominal abscess (HCC) 10/28/2018   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   LEARNING DISABILITY 09/25/2007   Qualifier: Diagnosis of  By: Jenetta MD, Talia     Nausea and  vomiting    Prolonged Q-T interval on ECG    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   Pyelonephritis 06/23/2014   Renal and perinephric abscess 11/01/2018   Renal disorder    Seasonal allergies    Seizures (HCC)    UTI (urinary tract infection) 01/09/2015   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   Qualifier: Diagnosis of  By: [place]  MD, Stephanie       Medicines Meds ordered this encounter  Medications   sodium chloride  0.9 % bolus 1,000 mL   sucralfate  (CARAFATE ) tablet 1 g   droperidol  (INAPSINE ) 2.5 MG/ML injection 1.25 mg   sodium chloride  0.9 % bolus 1,000 mL   pantoprazole  (PROTONIX ) injection 40 mg   morphine  (PF) 4 MG/ML injection 4 mg    I have reviewed the patients home medicines and have made adjustments as needed  Problem List / ED Course: Problem List Items Addressed This Visit   None Visit Diagnoses       Epigastric pain    -  Primary                Final diagnoses:  Epigastric pain    ED Discharge Orders     None          Bari Charmaine FALCON, MD 11/04/23 [phone]

## 2023-11-24 NOTE — Progress Notes (Signed)
 Patient received in recovery area following EGD, connected to bedside monitor, family brought back to bedside. Vital signs stable, patient denies pain. MD to bedside to discuss procedure with patient. Discharge instructions reviewed with patient/family member. Patient/family member confirmed understanding/denied questions. Vital signs stable on room air at time of discharge, patient denies pain, IV discontinued, transport ordered. Patient discharged home via wheelchair in stable condition.

## 2023-11-29 ENCOUNTER — Encounter (HOSPITAL_BASED_OUTPATIENT_CLINIC_OR_DEPARTMENT_OTHER): Attending: General Surgery | Admitting: General Surgery

## 2023-12-31 ENCOUNTER — Other Ambulatory Visit: Payer: Self-pay

## 2023-12-31 ENCOUNTER — Encounter (HOSPITAL_BASED_OUTPATIENT_CLINIC_OR_DEPARTMENT_OTHER): Payer: Self-pay

## 2023-12-31 ENCOUNTER — Emergency Department (HOSPITAL_BASED_OUTPATIENT_CLINIC_OR_DEPARTMENT_OTHER)
Admission: EM | Admit: 2023-12-31 | Discharge: 2023-12-31 | Disposition: A | Discharge status: Home / Self Care | Attending: Emergency Medicine | Admitting: Emergency Medicine

## 2023-12-31 DIAGNOSIS — K029 Dental caries, unspecified: Secondary | ICD-10-CM | POA: Insufficient documentation

## 2023-12-31 DIAGNOSIS — K0889 Other specified disorders of teeth and supporting structures: Secondary | ICD-10-CM

## 2023-12-31 DIAGNOSIS — Z940 Kidney transplant status: Secondary | ICD-10-CM | POA: Insufficient documentation

## 2023-12-31 DIAGNOSIS — Z794 Long term (current) use of insulin: Secondary | ICD-10-CM | POA: Insufficient documentation

## 2023-12-31 DIAGNOSIS — Z79899 Other long term (current) drug therapy: Secondary | ICD-10-CM | POA: Diagnosis not present

## 2023-12-31 MED ORDER — OXYCODONE-ACETAMINOPHEN 5-325 MG PO TABS
2.0000 | ORAL_TABLET | Freq: Once | ORAL | Status: AC
  Administered 2023-12-31: 2 via ORAL
  Filled 2023-12-31: qty 2

## 2023-12-31 MED ORDER — OXYCODONE HCL 5 MG PO TABS: 5.0000 mg | ORAL_TABLET | ORAL | 0 refills | Status: DC | PRN

## 2023-12-31 NOTE — ED Triage Notes (Signed)
 Pt has lower right tooth pain x 2 days. Pt states that the tooth is cracked.

## 2023-12-31 NOTE — ED Provider Notes (Signed)
 [place] EMERGENCY DEPARTMENT AT [facility] Provider Note   CSN: [id number] Arrival date & time: 12/31/23  9762     Patient presents with: Dental Pain   [name] is a 38 y.o. female.   presenting with a chief complaint of a toothache that has persisted for two days. The pain is localized to the back, bottom right side of the mouth, affecting a wisdom tooth. The patient has a history of a kidney transplant performed last year, which has delayed dental interventions due to medical advice to avoid dental visits for six to eight months post-transplant. The patient reports difficulty sleeping due to the pain and has been advised to avoid anti-inflammatory medications like ibuprofen due to the kidney transplant. The patient has been taking Tylenol , which has not alleviated the pain. Denies difficulty breathing, swallowing or speaking. Recently, the patient was prescribed amoxicillin  for dental infection, and there is a pending dental appointment for further evaluation and treatment. The history was obtained from the patient.   Dental Pain      Prior to Admission medications   Medication Sig Start Date End Date Taking? Authorizing Provider  oxyCODONE  (ROXICODONE ) 5 MG immediate release tablet Take 1 tablet (5 mg total) by mouth every 4 (four) hours as needed for severe pain (pain score 7-10). 12/31/23  Yes Anadelia Kintz, Selinda, MD  calcitRIOL  (ROCALTROL ) 0.5 MCG capsule Take 0.5 mcg by mouth daily. 05/16/23   [provider]  Calcium  Carbonate Antacid (CALCIUM  CARBONATE, DOSED IN MG ELEMENTAL CALCIUM ,) 1250 MG/5ML SUSP Take 500 mg of elemental calcium  by mouth 3 (three) times daily. 05/15/23   [provider]  carvedilol  (COREG ) 12.5 MG tablet Take 12.5 mg by mouth 2 (two) times daily. 05/16/23   [provider]  conjugated estrogens (PREMARIN) vaginal cream Place 1 applicator vaginally daily as needed (for dryness). 05/23/23 05/22/24  [provider]   ELIQUIS  5 MG TABS tablet Take 5 mg by mouth 2 (two) times daily. Patient not taking: Reported on 08/09/2023 05/16/23   [provider]  esomeprazole (NEXIUM) 40 MG capsule Take 40 mg by mouth 2 (two) times daily before a meal. 09/27/23   [provider]  famotidine  (PEPCID ) 20 MG tablet Take 1 tablet (20 mg total) by mouth 2 (two) times daily. 06/21/23   Breandan People, Selinda, MD  gabapentin  (NEURONTIN ) 300 MG capsule Take 300 mg by mouth at bedtime.    [provider]  hydrOXYzine  (ATARAX ) 25 MG tablet Take 25 mg by mouth daily as needed for itching. 05/16/23   [provider]  Insulin  Aspart FlexPen (NOVOLOG ) 100 UNIT/ML inject 4-10 units  under the skin with meals as directed.  Max 30 units per day 05/19/23   [provider]  insulin  degludec (TRESIBA ) 100 UNIT/ML FlexTouch Pen Inject 30 Units into the skin daily. 05/19/23 05/13/24  [provider]  methocarbamol  (ROBAXIN ) 500 MG tablet Take 1 tablet (500 mg total) by mouth every 8 (eight) hours as needed for muscle spasms. 07/14/23   Harden Jerona GAILS, MD  metoCLOPramide  (REGLAN ) 10 MG tablet Take 1 tablet (10 mg total) by mouth every 6 (six) hours. 11/01/23   Garrick Charleston, MD  mycophenolate  (CELLCEPT ) 250 MG capsule Take 1 capsule (250 mg total) by mouth 2 (two) times daily. Please resume your home dose of 750 twice daily dose once you have finished your deficit treatment on 06/18/2023. 06/14/23   Singh, Prashant K, MD  ondansetron  (ZOFRAN -ODT) 4 MG disintegrating tablet Take 1 tablet (4  mg total) by mouth every 8 (eight) hours as needed for nausea or vomiting. 11/01/23   Garrick Charleston, MD  pravastatin  (PRAVACHOL ) 40 MG tablet Take 40 mg by mouth daily. 05/16/23   [provider]  predniSONE  (DELTASONE ) 5 MG tablet Take 5 mg by mouth daily with breakfast.    [provider]  sucralfate  (CARAFATE ) 1 g tablet Take 1 tablet (1 g total) by mouth 4 (four) times daily -  with meals and at bedtime for 15  days. 06/21/23 07/06/23  Rotha Cassels, Selinda, MD  sulfamethoxazole -trimethoprim  (BACTRIM ) 400-80 MG tablet Take 1 tablet by mouth 3 (three) times a week. Monday, Wednesday and Fridays 05/16/23   [provider]  tacrolimus  (PROGRAF ) 5 MG capsule Take 1 capsule (5 mg total) by mouth 2 (two) times daily. 06/10/23   Rosario Leatrice FERNS, MD    Allergies: Diphenhydramine, Peanut-containing drug products, Contrast media [iodinated contrast media], Shellfish allergy, Chlorhexidine , Ferrous sulfate, and Iron  dextran    Review of Systems  Updated Vital Signs BP 120/84 (BP Location: Right Arm)   Pulse 90   Temp 98.3 F (36.8 C) (Oral)   Resp 17   LMP  (LMP Unknown)   SpO2 97%   Physical Exam Vitals and nursing note reviewed.  Constitutional:      Appearance: She is well-developed.  HENT:     Head: Normocephalic and atraumatic.     Mouth/Throat:     Dentition: Abnormal dentition. Dental tenderness and dental caries present. No gingival swelling or dental abscesses.  Cardiovascular:     Rate and Rhythm: Normal rate and regular rhythm.  Pulmonary:     Effort: No respiratory distress.     Breath sounds: No stridor.  Abdominal:     General: There is no distension.  Musculoskeletal:     Cervical back: Normal range of motion.  Neurological:     Mental Status: She is alert.     (all labs ordered are listed, but only abnormal results are displayed) Labs Reviewed - No data to display  EKG: None  Radiology: No results found.   Procedures   Medications Ordered in the ED  oxyCODONE -acetaminophen  (PERCOCET/ROXICET) 5-325 MG per tablet 2 tablet (2 tablets Oral Given 12/31/23 0407)                                    Medical Decision Making Risk Prescription drug management.   The patient presented with a toothache that has persisted for two days, affecting the back bottom right tooth. The patient has a history of a kidney transplant last year and was advised to avoid dental visits  for six to eight months post-transplant. The patient has been prescribed amoxicillin  by a dentist, but the pain persists, impacting sleep and work. Due to the kidney transplant, the patient is advised against using anti-inflammatories like ibuprofen. Tylenol  has been ineffective in managing the pain. In the ED, a dose of oxycodone  was administered for pain relief, and a prescription for a short course of oxycodone  was sent to the patient's pharmacy to manage pain until the antibiotics take effect. The patient is scheduled for dental surgery to address the underlying dental issues.  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  Diagnostics Review  Historians other than the patient: The patient's friend provided additional context and support during the visit.  Care significantly affected by the following chronic Conditions: The patient's kidney transplant significantly impacts the management of their dental pain, as it necessitates avoiding NSAIDs to prevent potential kidney damage.   Final diagnoses:  Pain, dental    ED Discharge Orders          Ordered    oxyCODONE  (ROXICODONE ) 5 MG immediate release tablet  Every 4 hours PRN        12/31/23 0357               Fateh Kindle, MD 12/31/23 [id number]

## 2024-01-08 ENCOUNTER — Encounter (HOSPITAL_BASED_OUTPATIENT_CLINIC_OR_DEPARTMENT_OTHER): Payer: Self-pay

## 2024-01-08 ENCOUNTER — Emergency Department (HOSPITAL_BASED_OUTPATIENT_CLINIC_OR_DEPARTMENT_OTHER)
Admission: EM | Admit: 2024-01-08 | Discharge: 2024-01-08 | Disposition: A | Discharge status: Home / Self Care | Attending: Emergency Medicine | Admitting: Emergency Medicine

## 2024-01-08 ENCOUNTER — Other Ambulatory Visit: Payer: Self-pay

## 2024-01-08 DIAGNOSIS — Z91010 Allergy to peanuts: Secondary | ICD-10-CM | POA: Insufficient documentation

## 2024-01-08 DIAGNOSIS — R739 Hyperglycemia, unspecified: Secondary | ICD-10-CM | POA: Diagnosis not present

## 2024-01-08 DIAGNOSIS — Z940 Kidney transplant status: Secondary | ICD-10-CM | POA: Insufficient documentation

## 2024-01-08 DIAGNOSIS — R1013 Epigastric pain: Secondary | ICD-10-CM | POA: Diagnosis present

## 2024-01-08 DIAGNOSIS — N185 Chronic kidney disease, stage 5: Secondary | ICD-10-CM | POA: Insufficient documentation

## 2024-01-08 DIAGNOSIS — R112 Nausea with vomiting, unspecified: Secondary | ICD-10-CM

## 2024-01-08 DIAGNOSIS — R1084 Generalized abdominal pain: Secondary | ICD-10-CM

## 2024-01-08 LAB — COMPREHENSIVE METABOLIC PANEL WITH GFR
ALT: 14 U/L (ref 0–44)
AST: 15 U/L (ref 15–41)
Albumin: 4.9 g/dL (ref 3.5–5.0)
Alkaline Phosphatase: 75 U/L (ref 38–126)
Anion gap: 16 — ABNORMAL HIGH (ref 5–15)
BUN: 51 mg/dL — ABNORMAL HIGH (ref 6–20)
CO2: 20 mmol/L — ABNORMAL LOW (ref 22–32)
Calcium: 9.1 mg/dL (ref 8.9–10.3)
Chloride: 97 mmol/L — ABNORMAL LOW (ref 98–111)
Creatinine, Ser: 2.84 mg/dL — ABNORMAL HIGH (ref 0.44–1.00)
GFR, Estimated: 21 mL/min — ABNORMAL LOW (ref >60)
Glucose, Bld: 466 mg/dL — ABNORMAL HIGH (ref 70–99)
Potassium: 4.2 mmol/L (ref 3.5–5.1)
Sodium: 133 mmol/L — ABNORMAL LOW (ref 135–145)
Total Bilirubin: 0.4 mg/dL (ref 0.0–1.2)
Total Protein: 8.4 g/dL — ABNORMAL HIGH (ref 6.5–8.1)

## 2024-01-08 LAB — URINALYSIS, ROUTINE W REFLEX MICROSCOPIC
Bacteria, UA: NONE SEEN
Bilirubin Urine: NEGATIVE
Glucose, UA: >1000 mg/dL — AB
Hgb urine dipstick: NEGATIVE
Leukocytes,Ua: NEGATIVE
Nitrite: NEGATIVE
Protein, ur: 30 mg/dL — AB
Specific Gravity, Urine: 1.021 (ref 1.005–1.030)
pH: 5 (ref 5.0–8.0)

## 2024-01-08 LAB — CBC
HCT: 28.4 % — ABNORMAL LOW (ref 36.0–46.0)
Hemoglobin: 9.4 g/dL — ABNORMAL LOW (ref 12.0–15.0)
MCH: 29.9 pg (ref 26.0–34.0)
MCHC: 33.1 g/dL (ref 30.0–36.0)
MCV: 90.4 fL (ref 80.0–100.0)
Platelets: 278 K/uL (ref 150–400)
RBC: 3.14 MIL/uL — ABNORMAL LOW (ref 3.87–5.11)
RDW: 14.8 % (ref 11.5–15.5)
WBC: 7.5 K/uL (ref 4.0–10.5)
nRBC: 0 % (ref 0.0–0.2)

## 2024-01-08 LAB — PREGNANCY, URINE: Preg Test, Ur: NEGATIVE

## 2024-01-08 LAB — CBG MONITORING, ED: Glucose-Capillary: 390 mg/dL — ABNORMAL HIGH (ref 70–99)

## 2024-01-08 LAB — LIPASE, BLOOD: Lipase: 42 U/L (ref 11–51)

## 2024-01-08 MED ORDER — SODIUM CHLORIDE 0.9 % IV BOLUS
1000.0000 mL | Freq: Once | INTRAVENOUS | Status: AC
  Administered 2024-01-08: 1000 mL via INTRAVENOUS

## 2024-01-08 MED ORDER — PROCHLORPERAZINE EDISYLATE 10 MG/2ML IJ SOLN
10.0000 mg | Freq: Once | INTRAMUSCULAR | Status: AC
  Administered 2024-01-08: 10 mg via INTRAVENOUS
  Filled 2024-01-08: qty 2

## 2024-01-08 MED ORDER — PROCHLORPERAZINE MALEATE 10 MG PO TABS: 10.0000 mg | ORAL_TABLET | Freq: Two times a day (BID) | ORAL | 0 refills | Status: AC | PRN

## 2024-01-08 MED ORDER — OXYCODONE HCL 5 MG PO TABS: 5.0000 mg | ORAL_TABLET | ORAL | 0 refills | Status: AC | PRN

## 2024-01-08 MED ORDER — ONDANSETRON 4 MG PO TBDP
4.0000 mg | ORAL_TABLET | Freq: Once | ORAL | Status: AC
  Administered 2024-01-08: 4 mg via ORAL
  Filled 2024-01-08: qty 1

## 2024-01-08 MED ORDER — OXYCODONE HCL 5 MG PO TABS
5.0000 mg | ORAL_TABLET | Freq: Once | ORAL | Status: AC
  Administered 2024-01-08: 5 mg via ORAL
  Filled 2024-01-08: qty 1

## 2024-01-08 MED ORDER — HYDROMORPHONE HCL 1 MG/ML IJ SOLN
1.0000 mg | Freq: Once | INTRAMUSCULAR | Status: AC
  Administered 2024-01-08: 1 mg via INTRAVENOUS
  Filled 2024-01-08: qty 1

## 2024-01-08 NOTE — ED Provider Notes (Signed)
 [place] EMERGENCY DEPARTMENT AT [facility]  Provider Note  CSN: [id number] Arrival date & time: 01/08/24 9947  History Chief Complaint  Patient presents with   Emesis   Abdominal Pain    [name] is a 38 y.o. female with complex PMH including ESRD s/p renal transplant x2 (not currently on HD) reports 2 days of epigastric pain and vomiting. Subjective fever at home. She had EGD about 3 weeks ago with esophageal stricture, but procedure aborted due to food in stomach, rescheduled for this week at [facility].    Home Medications Prior to Admission medications   Medication Sig Start Date End Date Taking? Authorizing Provider  prochlorperazine  (COMPAZINE ) 10 MG tablet Take 1 tablet (10 mg total) by mouth 2 (two) times daily as needed for nausea or vomiting. 01/08/24  Yes Roselyn Carlin NOVAK, MD  calcitRIOL  (ROCALTROL ) 0.5 MCG capsule Take 0.5 mcg by mouth daily. 05/16/23   [provider]  Calcium  Carbonate Antacid (CALCIUM  CARBONATE, DOSED IN MG ELEMENTAL CALCIUM ,) 1250 MG/5ML SUSP Take 500 mg of elemental calcium  by mouth 3 (three) times daily. 05/15/23   [provider]  carvedilol  (COREG ) 12.5 MG tablet Take 12.5 mg by mouth 2 (two) times daily. 05/16/23   [provider]  conjugated estrogens (PREMARIN) vaginal cream Place 1 applicator vaginally daily as needed (for dryness). 05/23/23 05/22/24  [provider]  ELIQUIS  5 MG TABS tablet Take 5 mg by mouth 2 (two) times daily. Patient not taking: Reported on 08/09/2023 05/16/23   [provider]  esomeprazole (NEXIUM) 40 MG capsule Take 40 mg by mouth 2 (two) times daily before a meal. 09/27/23   [provider]  famotidine  (PEPCID ) 20 MG tablet Take 1 tablet (20 mg total) by mouth 2 (two) times daily. 06/21/23   Mesner, Selinda, MD  gabapentin  (NEURONTIN ) 300 MG capsule Take 300 mg by mouth at bedtime.    [provider]  hydrOXYzine  (ATARAX ) 25 MG tablet Take 25 mg by mouth  daily as needed for itching. 05/16/23   [provider]  Insulin  Aspart FlexPen (NOVOLOG ) 100 UNIT/ML inject 4-10 units  under the skin with meals as directed.  Max 30 units per day 05/19/23   [provider]  insulin  degludec (TRESIBA ) 100 UNIT/ML FlexTouch Pen Inject 30 Units into the skin daily. 05/19/23 05/13/24  [provider]  methocarbamol  (ROBAXIN ) 500 MG tablet Take 1 tablet (500 mg total) by mouth every 8 (eight) hours as needed for muscle spasms. 07/14/23   Harden Jerona GAILS, MD  mycophenolate  (CELLCEPT ) 250 MG capsule Take 1 capsule (250 mg total) by mouth 2 (two) times daily. Please resume your home dose of 750 twice daily dose once you have finished your deficit treatment on 06/18/2023. 06/14/23   Singh, Prashant K, MD  oxyCODONE  (ROXICODONE ) 5 MG immediate release tablet Take 1 tablet (5 mg total) by mouth every 4 (four) hours as needed for severe pain (pain score 7-10). 01/08/24   Roselyn Carlin NOVAK, MD  pravastatin  (PRAVACHOL ) 40 MG tablet Take 40 mg by mouth daily. 05/16/23   [provider]  predniSONE  (DELTASONE ) 5 MG tablet Take 5 mg by mouth daily with breakfast.    [provider]  sucralfate  (CARAFATE ) 1 g tablet Take 1 tablet (1 g total) by mouth 4 (four) times daily -  with meals and at bedtime for 15 days. 06/21/23 07/06/23  Mesner, Selinda, MD  sulfamethoxazole -trimethoprim  (BACTRIM ) 400-80 MG tablet Take 1 tablet by mouth 3 (three)  times a week. Monday, Wednesday and Fridays 05/16/23   [provider]  tacrolimus  (PROGRAF ) 5 MG capsule Take 1 capsule (5 mg total) by mouth 2 (two) times daily. 06/10/23   Rosario Leatrice FERNS, MD     Allergies    Diphenhydramine, Peanut-containing drug products, Contrast media [iodinated contrast media], Shellfish allergy, Chlorhexidine , Ferrous sulfate, and Iron  dextran   Review of Systems   Review of Systems Please see HPI for pertinent positives and negatives  Physical Exam BP (!) 182/119 (BP Location:  Right Arm)   Pulse 100   Temp 98 F (36.7 C) (Oral)   Resp 19   LMP  (LMP Unknown)   SpO2 100%   Physical Exam Vitals and nursing note reviewed.  Constitutional:      Appearance: Normal appearance.  HENT:     Head: Normocephalic and atraumatic.     Nose: Nose normal.     Mouth/Throat:     Mouth: Mucous membranes are moist.  Eyes:     Extraocular Movements: Extraocular movements intact.     Conjunctiva/sclera: Conjunctivae normal.  Cardiovascular:     Rate and Rhythm: Normal rate.  Pulmonary:     Effort: Pulmonary effort is normal.     Breath sounds: Normal breath sounds.  Abdominal:     General: Abdomen is flat.     Palpations: Abdomen is soft.     Tenderness: There is generalized abdominal tenderness. There is no guarding. Negative signs include Murphy's sign and McBurney's sign.  Musculoskeletal:        General: No swelling. Normal range of motion.     Cervical back: Neck supple.  Skin:    General: Skin is warm and dry.  Neurological:     General: No focal deficit present.     Mental Status: She is alert.  Psychiatric:        Mood and Affect: Mood normal.     ED Results / Procedures / Treatments   EKG EKG Interpretation Date/Time:  Sunday January 08 2024 01:58:38 EDT Ventricular Rate:  102 PR Interval:  145 QRS Duration:  87 QT Interval:  359 QTC Calculation: 468 R Axis:   52  Text Interpretation: Sinus tachycardia No significant change since last tracing Confirmed by Roselyn Dunnings [phone]) on 01/08/2024 2:04:07 AM  Procedures Procedures  Medications Ordered in the ED Medications  HYDROmorphone  (DILAUDID ) injection 1 mg (1 mg Intravenous Given 01/08/24 0205)  prochlorperazine  (COMPAZINE ) injection 10 mg (10 mg Intravenous Given 01/08/24 0205)  sodium chloride  0.9 % bolus 1,000 mL (0 mLs Intravenous Stopped 01/08/24 0425)    Initial Impression and Plan  Patient here with abdominal pain, N/V. Has not been able to keep down meds today. Noted to be  hypertensive in triage, not febrile here. Will check labs, give pain/nausea meds for comfort.   ED Course   Clinical Course as of 01/08/24 0524  [place] Jan 08, 2024  0203 Patient with difficult peripheral IV access. A 20ga angiocath was placed in R EJ without difficulty.  [CS]  0233 CBC with anemia, otherwise normal.  [CS]  0243 CMP with CKD at baseline, hyperglycemia without significant acidosis. Will give IVF and reassess.  [CS]  0244 Lipase is normal.  [CS]  0345 Patient feeling better, no further vomiting. Will give PO trial and recheck CBG. Awaiting UA.  [CS]  0451 Glucose is improving.  [CS]  0456 UA with glucose, but no infection. HCG is neg.  [CS]  [id number] Patient continues to rest comfortably without  vomiting. No indication for admission at this time. Plan discharge with Rx for pain/nausea meds and outpatient GI follow up at [facility] as scheduled later this week. [CS]    Clinical Course User Index [CS] Roselyn Carlin NOVAK, MD     MDM Rules/Calculators/A&P Medical Decision Making Problems Addressed: Generalized abdominal pain: acute illness or injury History of renal transplant: chronic illness or injury Nausea and vomiting, unspecified vomiting type: acute illness or injury Stage 5 chronic kidney disease not on chronic dialysis [facility]): chronic illness or injury  Amount and/or Complexity of Data Reviewed Labs: ordered. Decision-making details documented in ED Course.  Risk Prescription drug management. Parenteral controlled substances. Decision regarding hospitalization.     Final Clinical Impression(s) / ED Diagnoses Final diagnoses:  Generalized abdominal pain  Nausea and vomiting, unspecified vomiting type  Stage 5 chronic kidney disease not on chronic dialysis (HCC)  History of renal transplant    Rx / DC Orders ED Discharge Orders          Ordered    oxyCODONE  (ROXICODONE ) 5 MG immediate release tablet  Every 4 hours PRN        01/08/24 0524    prochlorperazine   (COMPAZINE ) 10 MG tablet  2 times daily PRN        01/08/24 0524             Roselyn Carlin NOVAK, MD 01/08/24 [phone]

## 2024-01-08 NOTE — ED Notes (Signed)
Water and crackers given for PO challenge.

## 2024-01-08 NOTE — ED Triage Notes (Signed)
 Pt POV from home c/o generalized abd pain and N/V x 2 days. Pt states subjective fever at home of 102 earlier today, took Tylenol  for same.

## 2024-01-11 NOTE — ED Provider Notes (Signed)
 [facility] EMERGENCY DEPARTMENT  ED Provider Note History   Chief Complaint  Patient presents with  . Abdominal Pain    Epigastric   History of Present Illness  HPI Level of Interpreter Services: No interpreter needed (no language barrier)  Pt to ED via triage.  She states that she was referred to ED from outpatient GI visit for epigastric pain. She is unsure why she was referred to the ED. I am unable to see an associated encounter by chart review and attempts to call office to clarify x2 were not successful.   She reports ongoing epigastric pain for some time, states 'I know this is my esophagus'. By chart review, previous EGD showing erosive esophagitis, follow-up in early October but procedure abandoned due to full stomach.   Pt denies F/C, retrosternal CP, SOB, flank pain, urinary symptoms.  Hx ESRD, DM, HTN.   Past Medical History:  Diagnosis Date  . Diabetes mellitus without complication (CMS/HHS-HCC)   . ESRD (end stage renal disease) (CMS/HHS-HCC)   . FSGS (focal segmental glomerulosclerosis)   . History of blood transfusion   . Hypertension    Past Surgical History:  Procedure Laterality Date  . TRANSPLANT KIDNEY  2007  . PARATHYROIDECTOMY  2007  . LAPAROSCOPIC CHOLECYSTECTOMY  06/2017  . ESOPHAGOGASTRODOUDENOSCOPY W/DILATION N/A 11/29/2022   Procedure: EGD - Upper Endoscopy with Possible Dilation;  Surgeon: Dufault, Darin Lyn, MD;  Location: [facility];  Service: Gastroenterology;  Laterality: N/A;  . EGD N/A 12/23/2022   Procedure: EGD - Upper Endoscopy with Possible Dilation;  Surgeon: Dufault, Darin Lyn, MD;  Location: [facility];  Service: Gastroenterology;  Laterality: N/A;  . CORI W/INJECTION N/A 07/07/2023   Procedure: ESOPHAGOGASTRODUODENOSCOPY, FLEXIBLE, TRANSORAL; WITH DIRECTED SUBMUCOSAL INJECTION(S), ANY SUBSTANCE;  Surgeon: Dufault, Darin Lyn, MD;  Location: [facility];  Service: Gastroenterology;   Laterality: N/A;  . ESOPHAGOGASTRODOUDENOSCOPY W/DILATION N/A 09/22/2023   Procedure: ESOPHAGOGASTRODUODENOSCOPY, FLEXIBLE, TRANSORAL; WITH TRANSENDOSCOPIC BALLOON DILATION OF ESOPHAGUS (LESS THAN 30 MM DIAMETER);  Surgeon: Dufault, Darin Lyn, MD;  Location: [facility];  Service: Gastroenterology;  Laterality: N/A;  . ESOPHAGOGASTRODOUDENOSCOPY W/DILATION N/A 11/03/2023   Procedure: EGD - Upper Endoscopy with Possible Dilation;  Surgeon: Dufault, Darin Lyn, MD;  Location: [facility];  Service: Gastroenterology;  Laterality: N/A;  . ESOPHAGOGASTRODOUDENOSCOPY W/BIOPSY N/A 11/03/2023   Procedure: ESOPHAGOGASTRODUODENOSCOPY, FLEXIBLE, TRANSORAL; WITH BIOPSY, SINGLE OR MULTIPLE;  Surgeon: Francois Abraham Saha, MD;  Location: [facility];  Service: Gastroenterology;  Laterality: N/A;  . ESOPHAGOGASTRODOUDENOSCOPY W/DILATION N/A 11/24/2023   Procedure: EGD - Upper Endoscopy with Possible Dilation;  Surgeon: Dufault, Darin Lyn, MD;  Location: [facility];  Service: Gastroenterology;  Laterality: N/A;  . EGD N/A 12/14/2023   Procedure: EGD - Upper Endoscopy with Possible Dilation;  Surgeon: Pioppo Charlott Tinnie Brunet, MD;  Location: [facility];  Service: Gastroenterology;  Laterality: N/A;  . AV graft Left    not functional  . AV graft Right    not functional  . ESOPHAGEAL DILATION     Family History  Problem Relation Age of Onset  . Cerebral aneurysm Mother   . Dementia Father   . Heart failure Father   . Anesthesia problems Neg Hx    Social History   Socioeconomic History  . Marital status: Single  Occupational History  . Occupation: disability  Tobacco Use  . Smoking status: Never  . Smokeless tobacco: Never  Vaping Use  . Vaping status: Never Used  Substance and Sexual Activity  .  Alcohol use: Never  . Drug use: Never  . Sexual activity: Defer   Social Drivers of Health   Financial Resource Strain: High Risk (05/11/2023)    Received from [facility]   Overall Financial Resource Strain (CARDIA)   . Difficulty of Paying Living Expenses: Hard  Food Insecurity: Food Insecurity Present (06/11/2023)   Received from [facility]   Hunger Vital Sign   . Within the past 12 months, you worried that your food would run out before you got the money to buy more.: Sometimes true   . Within the past 12 months, the food you bought just didn't last and you didn't have money to get more.: Often true  Transportation Needs: No Transportation Needs (06/11/2023)   Received from [facility] - Transportation   . Lack of Transportation (Medical): No   . Lack of Transportation (Non-Medical): No  Recent Concern: Transportation Needs - Unmet Transportation Needs (05/23/2023)   Received from [facility]   [facility] - Transportation   . Lack of Transportation (Medical): Yes   . Lack of Transportation (Non-Medical): Yes  Physical Activity: Sufficiently Active (10/24/2017)   Received from [facility]   Exercise Vital Sign   . Days of Exercise per Week: 7 days   . Minutes of Exercise per Session: 30 min  Stress: No Stress Concern Present (10/24/2017)   Received from [facility] - Occupational Stress Questionnaire   . Feeling of Stress : Not at all  Social Connections: Moderately Isolated (06/11/2023)   Received from [facility]   Social Connection and Isolation Panel   . In a typical week, how many times do you talk on the phone with family, friends, or neighbors?: Patient declined   . How often do you get together with friends or relatives?: More than three times a week   . How often do you attend church or religious services?: More than 4 times per year   . Do you belong to any clubs or organizations such as church groups, unions, fraternal or athletic groups, or school groups?: No   . How often do you attend meetings of the clubs or organizations you belong to?: Never   . Are you  married, widowed, divorced, separated, never married, or living with a partner?: Never married  Housing Stability: High Risk (05/09/2023)   Received from [facility]   . Within the past 12 months, have you ever stayed: outside, in a car, in a tent, in an overnight shelter, or temporarily in someone else's home(i.e.couch-surfing)?: No   . Are you worried about losing your housing?: Yes   Review of Systems ROS per HPI.   Physical Exam  BP (!) 123/94   Pulse 84   Temp 36.8 C (98.2 F) (Oral)   Resp 22   Ht 167.6 cm (5' 6)   Wt 79.4 kg (175 lb)   SpO2 100%   BMI 28.25 kg/m  Physical Exam Gen: awake, alert, no distress EENT:  normocephalic, atraumatic, conjunctiva normal, MMM, oropharynx grossly normal CV: regular S1 S2 Resp: CTAB GI: abdomen soft, non-distended, non-tender to palpation, + bowel sounds Back: no deformity, non-tender to palpation midline, no CVA tenderness MSK: extremities grossly normal Skin: warm, dry Neuro: CN II-XII grossly intact, no obvious focal motor or sensory deficit Psych: conversant, normal affect Physical Exam   Procedures  Procedures  Results  Medical Decision Making   Medical Decision Making  Pt is 38 yo F in ED with epigastric abdominal pain. She is overall well-appearing, HD stable. She has chronic epigastric discomfort, states that she was referred to ED from outpatient GI visit, though I am unable to confirm same by chart review and attempts to contact via telephone were not successful. I do not see evidence of perforation or obstructive process. She states that she is already taking PPI, Carafate . She is requesting PO pain control.  Considered but thought to be less likely atypical presentation pain from cardiac etiology. EKG: My interpretation: sinus tachycardia, rate 101, QTC 482, no STEMI. CXR NAP, to my eye. Laboratory evaluation consistent with reported ESRD. No hyperK. Cr 3.4 is not significantly changed from previous  2.9 at OSH earlier this month. Blood glucose 367, CO2 17. Considered but doubt DKA. Pseudohyponatremia noted. Mild anemia.  Pt is requesting DC prior to completion of secondary in serial troponin. This is negative. Will not make her wait for third troponin. Will DC with small Rx pain control, return precautions.   MDM        ED Clinical Impression  1. Abdominal pain, epigastric      ED Disposition  Discharge     Follow-up  [facility] [address]  [address] [place]  [id number] [phone]  As needed, If symptoms worsen       This note has been created using automated tools and reviewed for accuracy by [facility].   AI Note Feedback-- Point of Care Survey   Saunders Norleen Bruckner, [place] 01/11/24 1642

## 2024-01-11 NOTE — ED Triage Notes (Addendum)
 Patient arrives as walk-in, was at a GI appoint for follow-up esophageal dilation earlier this month and referred to ED for epigastric pain x 1 week. History of similar. Patient also reports being seeing by her nephrologist earlier today.  Endorses vomiting, loss of appetite over 3 days, weight loss. Denies fever, nausea, SOB  Hx of kidney transplant 2024

## 2024-01-11 NOTE — ED Notes (Signed)
 Discharge instructions given. Pt discharged to home. Vital signs stable and safe for discharge, all belongings sent home with patient. Discharge instructions reviewed with patient, responsible person verbalized understanding of follow up care and medications given.

## 2024-01-16 ENCOUNTER — Encounter: Payer: Self-pay | Admitting: Radiology

## 2024-01-28 NOTE — Telephone Encounter (Signed)
 Patient paged on call that she is out of her tacrolimus .    Week supply sent to local [facility]    She will call [facility] pharmacy Monday to set up fill

## 2024-02-04 ENCOUNTER — Emergency Department (HOSPITAL_BASED_OUTPATIENT_CLINIC_OR_DEPARTMENT_OTHER)
Admission: EM | Admit: 2024-02-04 | Discharge: 2024-02-04 | Disposition: A | Discharge status: Home / Self Care | Attending: Emergency Medicine | Admitting: Emergency Medicine

## 2024-02-04 ENCOUNTER — Encounter (HOSPITAL_BASED_OUTPATIENT_CLINIC_OR_DEPARTMENT_OTHER): Payer: Self-pay

## 2024-02-04 ENCOUNTER — Other Ambulatory Visit: Payer: Self-pay

## 2024-02-04 DIAGNOSIS — R739 Hyperglycemia, unspecified: Secondary | ICD-10-CM

## 2024-02-04 DIAGNOSIS — N186 End stage renal disease: Secondary | ICD-10-CM | POA: Diagnosis not present

## 2024-02-04 DIAGNOSIS — R809 Proteinuria, unspecified: Secondary | ICD-10-CM | POA: Insufficient documentation

## 2024-02-04 DIAGNOSIS — E785 Hyperlipidemia, unspecified: Secondary | ICD-10-CM | POA: Insufficient documentation

## 2024-02-04 DIAGNOSIS — E1022 Type 1 diabetes mellitus with diabetic chronic kidney disease: Secondary | ICD-10-CM | POA: Insufficient documentation

## 2024-02-04 DIAGNOSIS — R5383 Other fatigue: Secondary | ICD-10-CM | POA: Insufficient documentation

## 2024-02-04 DIAGNOSIS — R531 Weakness: Secondary | ICD-10-CM | POA: Diagnosis present

## 2024-02-04 DIAGNOSIS — Z91010 Allergy to peanuts: Secondary | ICD-10-CM | POA: Diagnosis not present

## 2024-02-04 DIAGNOSIS — E1065 Type 1 diabetes mellitus with hyperglycemia: Secondary | ICD-10-CM | POA: Diagnosis not present

## 2024-02-04 DIAGNOSIS — Z940 Kidney transplant status: Secondary | ICD-10-CM | POA: Insufficient documentation

## 2024-02-04 DIAGNOSIS — Z7901 Long term (current) use of anticoagulants: Secondary | ICD-10-CM | POA: Diagnosis not present

## 2024-02-04 LAB — CBG MONITORING, ED
Glucose-Capillary: 270 mg/dL — ABNORMAL HIGH (ref 70–99)
Glucose-Capillary: 212 mg/dL — ABNORMAL HIGH (ref 70–99)

## 2024-02-04 LAB — URINALYSIS, W/ REFLEX TO CULTURE (INFECTION SUSPECTED)
Bilirubin Urine: NEGATIVE
Glucose, UA: >1000 mg/dL — AB
Hgb urine dipstick: NEGATIVE
Ketones, ur: NEGATIVE mg/dL
Nitrite: NEGATIVE
Protein, ur: 30 mg/dL — AB
Specific Gravity, Urine: 1.014 (ref 1.005–1.030)
pH: 5.5 (ref 5.0–8.0)

## 2024-02-04 LAB — CBC WITH DIFFERENTIAL/PLATELET
Abs Immature Granulocytes: 0.03 K/uL (ref 0.00–0.07)
Basophils Absolute: 0 K/uL (ref 0.0–0.1)
Basophils Relative: 0 %
Eosinophils Absolute: 0.1 K/uL (ref 0.0–0.5)
Eosinophils Relative: 1 %
HCT: 28.8 % — ABNORMAL LOW (ref 36.0–46.0)
Hemoglobin: 9.2 g/dL — ABNORMAL LOW (ref 12.0–15.0)
Immature Granulocytes: 0 %
Lymphocytes Relative: 7 %
Lymphs Abs: 0.8 K/uL (ref 0.7–4.0)
MCH: 29.8 pg (ref 26.0–34.0)
MCHC: 31.9 g/dL (ref 30.0–36.0)
MCV: 93.2 fL (ref 80.0–100.0)
Monocytes Absolute: 0.8 K/uL (ref 0.1–1.0)
Monocytes Relative: 8 %
Neutro Abs: 8.7 K/uL — ABNORMAL HIGH (ref 1.7–7.7)
Neutrophils Relative %: 84 %
Platelets: 172 K/uL (ref 150–400)
RBC: 3.09 MIL/uL — ABNORMAL LOW (ref 3.87–5.11)
RDW: 13.2 % (ref 11.5–15.5)
WBC: 10.4 K/uL (ref 4.0–10.5)
nRBC: 0 % (ref 0.0–0.2)

## 2024-02-04 LAB — BASIC METABOLIC PANEL WITH GFR
Anion gap: 17 — ABNORMAL HIGH (ref 5–15)
BUN: 22 mg/dL — ABNORMAL HIGH (ref 6–20)
CO2: 15 mmol/L — ABNORMAL LOW (ref 22–32)
Calcium: 7.9 mg/dL — ABNORMAL LOW (ref 8.9–10.3)
Chloride: 103 mmol/L (ref 98–111)
Creatinine, Ser: 2.83 mg/dL — ABNORMAL HIGH (ref 0.44–1.00)
GFR, Estimated: 21 mL/min — ABNORMAL LOW (ref >60)
Glucose, Bld: 269 mg/dL — ABNORMAL HIGH (ref 70–99)
Potassium: 4 mmol/L (ref 3.5–5.1)
Sodium: 135 mmol/L (ref 135–145)

## 2024-02-04 LAB — RESP PANEL BY RT-PCR (RSV, FLU A&B, COVID)  RVPGX2
Influenza A by PCR: NEGATIVE
Influenza B by PCR: NEGATIVE
Resp Syncytial Virus by PCR: NEGATIVE
SARS Coronavirus 2 by RT PCR: NEGATIVE

## 2024-02-04 LAB — HCG, SERUM, QUALITATIVE: Preg, Serum: NEGATIVE

## 2024-02-04 MED ORDER — SODIUM CHLORIDE 0.9 % IV BOLUS
500.0000 mL | Freq: Once | INTRAVENOUS | Status: AC
  Administered 2024-02-04: 500 mL via INTRAVENOUS

## 2024-02-04 NOTE — ED Provider Notes (Addendum)
 [place] EMERGENCY DEPARTMENT AT [facility] Provider Note   CSN: [id number] Arrival date & time: 02/04/24  1455     Patient presents with: No chief complaint on file.   [name] is a 38 y.o. female.   Patient is a 38 year old female who presents with general weakness.  She has a history of diabetes mellitus, type I, end-stage renal disease status post kidney transplant, hyperlipidemia.  She says that the pharmacy has been out of her Dexcom.  She is not going to be able to get it till next week, maybe Monday.  She does not have any way to monitor her glucose at home.  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  She then ate a Hershey bar and drink some glucose packets and came here.  It was over 200 when she got here.  She feels better but overall has been feeling a little bit fatigued.  No known fevers.  No cough or cold symptoms.  No burning on urination but she says her urine has been a little foamy.  No abdominal pain.  No vomiting or diarrhea.       Prior to Admission medications   Medication Sig Start Date End Date Taking? Authorizing Provider  calcitRIOL  (ROCALTROL ) 0.5 MCG capsule Take 0.5 mcg by mouth daily. 05/16/23   [provider]  Calcium  Carbonate Antacid (CALCIUM  CARBONATE, DOSED IN MG ELEMENTAL CALCIUM ,) 1250 MG/5ML SUSP Take 500 mg of elemental calcium  by mouth 3 (three) times daily. 05/15/23   [provider]  carvedilol  (COREG ) 12.5 MG tablet Take 12.5 mg by mouth 2 (two) times daily. 05/16/23   [provider]  conjugated estrogens (PREMARIN) vaginal cream Place 1 applicator vaginally daily as needed (for dryness). 05/23/23 05/22/24  [provider]  ELIQUIS  5 MG TABS tablet Take 5 mg by mouth 2 (two) times daily. Patient not taking: Reported on 08/09/2023 05/16/23   [provider]  esomeprazole (NEXIUM) 40 MG capsule Take 40 mg by mouth 2  (two) times daily before a meal. 09/27/23   [provider]  famotidine  (PEPCID ) 20 MG tablet Take 1 tablet (20 mg total) by mouth 2 (two) times daily. 06/21/23   Mesner, Selinda, MD  gabapentin  (NEURONTIN ) 300 MG capsule Take 300 mg by mouth at bedtime.    [provider]  hydrOXYzine  (ATARAX ) 25 MG tablet Take 25 mg by mouth daily as needed for itching. 05/16/23   [provider]  Insulin  Aspart FlexPen (NOVOLOG ) 100 UNIT/ML inject 4-10 units  under the skin with meals as directed.  Max 30 units per day 05/19/23   [provider]  insulin  degludec (TRESIBA ) 100 UNIT/ML FlexTouch Pen Inject 30 Units into the skin daily. 05/19/23 05/13/24  [provider]  methocarbamol  (ROBAXIN ) 500 MG tablet Take 1 tablet (500 mg total) by mouth every 8 (eight) hours as needed for muscle spasms. 07/14/23   Harden Jerona GAILS, MD  mycophenolate  (CELLCEPT ) 250 MG capsule Take 1 capsule (250 mg total) by mouth 2 (two) times daily. Please resume your home dose of 750 twice daily dose once you have finished your deficit treatment on 06/18/2023. 06/14/23   Singh, Prashant K, MD  oxyCODONE  (ROXICODONE ) 5 MG immediate release tablet Take 1 tablet (5 mg total) by mouth every 4 (four) hours as needed for severe pain (pain score 7-10). 01/08/24   Roselyn Carlin NOVAK, MD  pravastatin  (  PRAVACHOL ) 40 MG tablet Take 40 mg by mouth daily. 05/16/23   [provider]  predniSONE  (DELTASONE ) 5 MG tablet Take 5 mg by mouth daily with breakfast.    [provider]  prochlorperazine  (COMPAZINE ) 10 MG tablet Take 1 tablet (10 mg total) by mouth 2 (two) times daily as needed for nausea or vomiting. 01/08/24   Roselyn Carlin NOVAK, MD  sucralfate  (CARAFATE ) 1 g tablet Take 1 tablet (1 g total) by mouth 4 (four) times daily -  with meals and at bedtime for 15 days. 06/21/23 07/06/23  Mesner, Selinda, MD  sulfamethoxazole -trimethoprim  (BACTRIM ) 400-80 MG tablet Take 1 tablet by mouth 3 (three) times a week. Monday,  Wednesday and Fridays 05/16/23   [provider]  tacrolimus  (PROGRAF ) 5 MG capsule Take 1 capsule (5 mg total) by mouth 2 (two) times daily. 06/10/23   Rosario Leatrice FERNS, MD    Allergies: Diphenhydramine, Peanut-containing drug products, Contrast media [iodinated contrast media], Shellfish allergy, Chlorhexidine , Ferrous sulfate, and Iron  dextran    Review of Systems  Constitutional:  Positive for fatigue. Negative for chills, diaphoresis and fever.  HENT:  Negative for congestion, rhinorrhea and sneezing.   Eyes: Negative.   Respiratory:  Negative for cough, chest tightness and shortness of breath.   Cardiovascular:  Negative for chest pain and leg swelling.  Gastrointestinal:  Negative for abdominal pain, diarrhea, nausea and vomiting.  Genitourinary:  Negative for difficulty urinating, flank pain, frequency and hematuria.  Musculoskeletal:  Negative for arthralgias and back pain.  Skin:  Negative for rash.  Neurological:  Negative for dizziness, speech difficulty, weakness, numbness and headaches.    Updated Vital Signs BP 114/83   Pulse 95   Temp 98.9 F (37.2 C) (Oral)   Resp 18   Ht 5' 6 (1.676 m)   Wt 72.6 kg   SpO2 99%   BMI 25.82 kg/m   Physical Exam Constitutional:      Appearance: She is well-developed.  HENT:     Head: Normocephalic and atraumatic.  Eyes:     Pupils: Pupils are equal, round, and reactive to light.  Cardiovascular:     Rate and Rhythm: Normal rate and regular rhythm.     Heart sounds: Normal heart sounds.  Pulmonary:     Effort: Pulmonary effort is normal. No respiratory distress.     Breath sounds: Normal breath sounds. No wheezing or rales.  Chest:     Chest wall: No tenderness.  Abdominal:     General: Bowel sounds are normal.     Palpations: Abdomen is soft.     Tenderness: There is no abdominal tenderness. There is no guarding or rebound.  Musculoskeletal:        General: Normal range of motion.     Cervical back: Normal  range of motion and neck supple.  Lymphadenopathy:     Cervical: No cervical adenopathy.  Skin:    General: Skin is warm and dry.     Findings: No rash.  Neurological:     General: No focal deficit present.     Mental Status: She is alert and oriented to person, place, and time.     (all labs ordered are listed, but only abnormal results are displayed) Labs Reviewed  URINALYSIS, W/ REFLEX TO CULTURE (INFECTION SUSPECTED) - Abnormal; Notable for the following components:      Result Value   APPearance HAZY (*)    Glucose, UA >1,000 (*)    Protein, ur 30 (*)  Leukocytes,Ua TRACE (*)    Bacteria, UA RARE (*)    All other components within normal limits  BASIC METABOLIC PANEL WITH GFR - Abnormal; Notable for the following components:   CO2 15 (*)    Glucose, Bld 269 (*)    BUN 22 (*)    Creatinine, Ser 2.83 (*)    Calcium  7.9 (*)    GFR, Estimated 21 (*)    Anion gap 17 (*)    All other components within normal limits  CBC WITH DIFFERENTIAL/PLATELET - Abnormal; Notable for the following components:   RBC 3.09 (*)    Hemoglobin 9.2 (*)    HCT 28.8 (*)    Neutro Abs 8.7 (*)    All other components within normal limits  CBG MONITORING, ED - Abnormal; Notable for the following components:   Glucose-Capillary 270 (*)    All other components within normal limits  CBG MONITORING, ED - Abnormal; Notable for the following components:   Glucose-Capillary 212 (*)    All other components within normal limits  RESP PANEL BY RT-PCR (RSV, FLU A&B, COVID)  RVPGX2  HCG, SERUM, QUALITATIVE    EKG: None  Radiology: No results found.   Procedures   Medications Ordered in the ED  sodium chloride  0.9 % bolus 500 mL ( Intravenous Stopped 02/04/24 1858)                                    Medical Decision Making Amount and/or Complexity of Data Reviewed Labs: ordered.   This patient presents to the ED for concern of weakness, this involves an extensive number of treatment  options, and is a complaint that carries with it a high risk of complications and morbidity.  I considered the following differential and admission for this acute, potentially life threatening condition.  The differential diagnosis includes dehydration, infection, DKA, hypogylcemia, anemia, ACS  MDM:    Patient is a 38 year old who presents with fatigue.  She is concerned because she does not have her Dexcom and cannot monitor her sugars.  It was in the 80s this morning and she ate a bunch of sugary stuff and then it went up to 270.  She was given IV fluids here.  Labs are nonconcerning.  She has an elevated creatinine but it is similar to her prior values and actually just a smidge better.  She does not have any suggestions of DKA.  Her anion gap was mildly elevated but she does not have any ketones in her urine.  She was given IV fluids.  No clinical concerns for infection.  No infection, no increased WBC count.  Her respiratory panel was negative.  I did speak with a child psychotherapist who did confirm that she likely would not be able to take get an insurance paid glucometer given that she uses a Dexcom.  They advised that she should buy an over-the-counter glucometer and advised that the cheapest place would be [facility].  I relayed this information to her.  Her urinalysis does not look consistent with infection.  She did have some glucosuria and proteinuria.  No ketones.  Advised her to make sure that her nephrologist is keeping a check on the proteinuria.  She was discharged home in good condition.  Currently is asymptomatic.  Was encouraged to have close follow-up with her primary care doctor.  Return precautions were given.  (Labs, imaging, consults)  Labs: I Ordered,  and personally interpreted labs.  The pertinent results include: Normal WBC count, elevated creatinine but stable from her baseline values  Imaging Studies ordered: I ordered imaging studies including none I independently visualized and  interpreted imaging. I agree with the radiologist interpretation  Additional history obtained from chart.  External records from outside source obtained and reviewed including prior notes  Cardiac Monitoring: The patient was not maintained on a cardiac monitor.  If on the cardiac monitor, I personally viewed and interpreted the cardiac monitored which showed an underlying rhythm of:    Reevaluation: After the interventions noted above, I reevaluated the patient and found that they have :improved  Social Determinants of Health:    Disposition: Discharged to home  Co morbidities that complicate the patient evaluation  Past Medical History:  Diagnosis Date   Anemia    Blood transfusion without reported diagnosis    Cellulitis of left leg 03/01/2018   Chronic kidney disease    kidney transplant 07   Diabetes mellitus    Pt reports diagnosis in June 2011, Type 2   Diabetes mellitus without complication (HCC)    Esophageal obstruction due to food impaction    GERD (gastroesophageal reflux disease)    Hyperlipidemia    Hypertension    Intra-abdominal abscess (HCC) 10/28/2018   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   LEARNING DISABILITY 09/25/2007   Qualifier: Diagnosis of  By: Jenetta MD, Talia     Nausea and vomiting    Prolonged Q-T interval on ECG    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   Pyelonephritis 06/23/2014   Renal and perinephric abscess 11/01/2018   Renal disorder    Seasonal allergies    Seizures (HCC)    UTI (urinary tract infection) 01/09/2015   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   Qualifier: Diagnosis of  By: Flint  MD, Stephanie       Medicines Meds ordered this encounter  Medications   sodium chloride  0.9 % bolus 500 mL    I have reviewed the patients home medicines and have made adjustments as needed  Problem List / ED Course: Problem List Items Addressed This Visit   None Visit Diagnoses       Other fatigue    -  Primary     Hyperglycemia                     Final diagnoses:  Other fatigue  Hyperglycemia    ED Discharge Orders     None          Lenor Hollering, MD 02/04/24 8052    Lenor Hollering, MD 02/04/24 1949

## 2024-02-04 NOTE — Discharge Instructions (Addendum)
 Make an appointment to have close follow-up with your primary care doctor.  Go to [facility] and get an over-the-counter glucometer for now and keep a check on your blood sugars.  Return to the emergency room if you have any worsening symptoms.

## 2024-02-04 NOTE — ED Triage Notes (Signed)
 Pt reports generalized weakness starting on Thursday at night. Pt also reports being out of her Dexcom and being unable to get prescription for new one. Pt reports glucose was 88 at work and she ate a hershey[employer] candy bar along with multiple glucose tabs. Then glucose on arrival is 270. NAD noted in triage. Pt ambulatory to triage.

## 2024-03-03 ENCOUNTER — Emergency Department (HOSPITAL_BASED_OUTPATIENT_CLINIC_OR_DEPARTMENT_OTHER)

## 2024-03-03 ENCOUNTER — Other Ambulatory Visit: Payer: Self-pay

## 2024-03-03 ENCOUNTER — Encounter (HOSPITAL_BASED_OUTPATIENT_CLINIC_OR_DEPARTMENT_OTHER): Payer: Self-pay

## 2024-03-03 ENCOUNTER — Emergency Department (HOSPITAL_BASED_OUTPATIENT_CLINIC_OR_DEPARTMENT_OTHER)
Admission: EM | Admit: 2024-03-03 | Discharge: 2024-03-03 | Discharge status: Against Medical Advice | Attending: Emergency Medicine | Admitting: Emergency Medicine

## 2024-03-03 DIAGNOSIS — Z940 Kidney transplant status: Secondary | ICD-10-CM | POA: Insufficient documentation

## 2024-03-03 DIAGNOSIS — Z992 Dependence on renal dialysis: Secondary | ICD-10-CM | POA: Insufficient documentation

## 2024-03-03 DIAGNOSIS — Z79899 Other long term (current) drug therapy: Secondary | ICD-10-CM | POA: Insufficient documentation

## 2024-03-03 DIAGNOSIS — N179 Acute kidney failure, unspecified: Secondary | ICD-10-CM | POA: Diagnosis not present

## 2024-03-03 DIAGNOSIS — N186 End stage renal disease: Secondary | ICD-10-CM | POA: Insufficient documentation

## 2024-03-03 DIAGNOSIS — E1122 Type 2 diabetes mellitus with diabetic chronic kidney disease: Secondary | ICD-10-CM | POA: Diagnosis not present

## 2024-03-03 DIAGNOSIS — Z7901 Long term (current) use of anticoagulants: Secondary | ICD-10-CM | POA: Diagnosis not present

## 2024-03-03 DIAGNOSIS — Z5329 Procedure and treatment not carried out because of patient's decision for other reasons: Secondary | ICD-10-CM | POA: Insufficient documentation

## 2024-03-03 DIAGNOSIS — M86172 Other acute osteomyelitis, left ankle and foot: Secondary | ICD-10-CM | POA: Diagnosis not present

## 2024-03-03 DIAGNOSIS — I120 Hypertensive chronic kidney disease with stage 5 chronic kidney disease or end stage renal disease: Secondary | ICD-10-CM | POA: Diagnosis not present

## 2024-03-03 DIAGNOSIS — M869 Osteomyelitis, unspecified: Secondary | ICD-10-CM

## 2024-03-03 DIAGNOSIS — Z7984 Long term (current) use of oral hypoglycemic drugs: Secondary | ICD-10-CM | POA: Insufficient documentation

## 2024-03-03 DIAGNOSIS — Z91010 Allergy to peanuts: Secondary | ICD-10-CM | POA: Insufficient documentation

## 2024-03-03 DIAGNOSIS — Z794 Long term (current) use of insulin: Secondary | ICD-10-CM | POA: Insufficient documentation

## 2024-03-03 DIAGNOSIS — M79672 Pain in left foot: Secondary | ICD-10-CM | POA: Diagnosis present

## 2024-03-03 LAB — BASIC METABOLIC PANEL WITH GFR
Anion gap: 17 — ABNORMAL HIGH (ref 5–15)
BUN: 42 mg/dL — ABNORMAL HIGH (ref 6–20)
CO2: 12 mmol/L — ABNORMAL LOW (ref 22–32)
Calcium: 7.8 mg/dL — ABNORMAL LOW (ref 8.9–10.3)
Chloride: 106 mmol/L (ref 98–111)
Creatinine, Ser: 7.32 mg/dL — ABNORMAL HIGH (ref 0.44–1.00)
GFR, Estimated: 7 mL/min — ABNORMAL LOW
Glucose, Bld: 243 mg/dL — ABNORMAL HIGH (ref 70–99)
Potassium: 5 mmol/L (ref 3.5–5.1)
Sodium: 135 mmol/L (ref 135–145)

## 2024-03-03 LAB — CBC WITH DIFFERENTIAL/PLATELET
Abs Immature Granulocytes: 0.01 K/uL (ref 0.00–0.07)
Basophils Absolute: 0 K/uL (ref 0.0–0.1)
Basophils Relative: 1 %
Eosinophils Absolute: 0.1 K/uL (ref 0.0–0.5)
Eosinophils Relative: 3 %
HCT: 26.8 % — ABNORMAL LOW (ref 36.0–46.0)
Hemoglobin: 8.5 g/dL — ABNORMAL LOW (ref 12.0–15.0)
Immature Granulocytes: 0 %
Lymphocytes Relative: 17 %
Lymphs Abs: 0.8 K/uL (ref 0.7–4.0)
MCH: 29.4 pg (ref 26.0–34.0)
MCHC: 31.7 g/dL (ref 30.0–36.0)
MCV: 92.7 fL (ref 80.0–100.0)
Monocytes Absolute: 0.5 K/uL (ref 0.1–1.0)
Monocytes Relative: 11 %
Neutro Abs: 3.1 K/uL (ref 1.7–7.7)
Neutrophils Relative %: 68 %
Platelets: 209 K/uL (ref 150–400)
RBC: 2.89 MIL/uL — ABNORMAL LOW (ref 3.87–5.11)
RDW: 13.2 % (ref 11.5–15.5)
WBC: 4.6 K/uL (ref 4.0–10.5)
nRBC: 0 % (ref 0.0–0.2)

## 2024-03-03 LAB — URINALYSIS, ROUTINE W REFLEX MICROSCOPIC
Bacteria, UA: NONE SEEN
Bilirubin Urine: NEGATIVE
Glucose, UA: 250 mg/dL — AB
Ketones, ur: NEGATIVE mg/dL
Nitrite: NEGATIVE
Protein, ur: 30 mg/dL — AB
Specific Gravity, Urine: 1.014 (ref 1.005–1.030)
pH: 5 (ref 5.0–8.0)

## 2024-03-03 LAB — PREGNANCY, URINE: Preg Test, Ur: NEGATIVE

## 2024-03-03 NOTE — ED Notes (Signed)
Pt. Refused gown

## 2024-03-03 NOTE — ED Triage Notes (Signed)
"   Pt reports hx of DM and reports possibly burning L foot a few weeks ago. Pt reports wound to middle toe. "

## 2024-03-03 NOTE — ED Provider Notes (Signed)
 " [place] EMERGENCY DEPARTMENT AT [facility] Provider Note   CSN: [id number] Arrival date & time: 03/03/24  1453     Patient presents with: Foot Burn and Wound Check   [name] is a 38 y.o. female.  {Add pertinent medical, surgical, social history, OB history to HPI:32947} HPI     38 year old female with a history of diabetes, ESRD status post kidney transplant, 03/03/2023, as well as transplant in 2007, prior DVT hyperlipidemia, hypertension, hyperlipidemia,   12/7 burned foot on the heater in the bathroom, then blister popped up, then ulcers. Has been taking tylenol  without relief of pain. Has had hammer toe since great toe was removed last year, but swelling developed.  Worried she will be put back in the hospital but didn't want to go because of bills. Spoke with Dr. Harden Jaeger with prior  Since it stasrted feels like it is getting better (Had prior burn a long time ago from turkey gravy) Not giving puss, just clear fluid. 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 Harden amputation 04/02/2022 Off eliquis  now No longer on bactrim  (was on it chronically until oct or sept)  Past Medical History:  Diagnosis Date   Anemia    Blood transfusion without reported diagnosis    Cellulitis of left leg 03/01/2018   Chronic kidney disease    kidney transplant 07   Diabetes mellitus    Pt reports diagnosis in June 2011, Type 2   Diabetes mellitus without complication (HCC)    Esophageal obstruction due to food impaction    GERD (gastroesophageal reflux disease)    Hyperlipidemia    Hypertension     Intra-abdominal abscess (HCC) 10/28/2018   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   LEARNING DISABILITY 09/25/2007   Qualifier: Diagnosis of  By: Jenetta MD, Talia     Nausea and vomiting    Prolonged Q-T interval on ECG    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   Pyelonephritis 06/23/2014   Renal and perinephric abscess 11/01/2018   Renal disorder    Seasonal allergies    Seizures (HCC)    UTI (urinary tract infection) 01/09/2015   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   Qualifier: Diagnosis of  By: Flint  MD, Corean      Past Surgical History:  Procedure Laterality Date   A/V FISTULAGRAM Left 02/24/2023   Procedure: A/V Fistulagram;  Surgeon: Gretta Lonni PARAS, MD;  Location: [facility] INVASIVE CV LAB;  Service: Cardiovascular;  Laterality: Left;   AMPUTATION Left 04/02/2022   Procedure: AMPUTATION LEFT GREAT TOE;  Surgeon: Harden Jerona GAILS, MD;  Location: [facility] OR;  Service: Orthopedics;  Laterality: Left;   ARTERIOVENOUS GRAFT PLACEMENT Bilateral    neither work (10/24/2017)   AV FISTULA PLACEMENT Left 10/26/2018   Procedure: CREATION OF ARTERIOVENOUS FISTULA  LEFT ARM;  Surgeon: Gretta Lonni PARAS, MD;  Location: [facility] OR;  Service: Vascular;  Laterality: Left;   AV FISTULA PLACEMENT Left 05/23/2020   Procedure: LEFT ARM ARTERIOVENOUS (AV) FISTULA CREATION;  Surgeon: Gretta Lonni PARAS, MD;  Location: [facility] OR;  Service: Vascular;  Laterality: Left;   BALLOON DILATION N/A 01/19/2021  Procedure: BALLOON DILATION;  Surgeon: Avram Lupita BRAVO, MD;  Location: [facility];  Service: Endoscopy;  Laterality: N/A;   BALLOON DILATION N/A 02/10/2021   Procedure: BALLOON DILATION;  Surgeon: Abran Norleen SAILOR, MD;  Location: [facility];  Service: Endoscopy;  Laterality: N/A;   BALLOON DILATION N/A 02/19/2021   Procedure: BALLOON DILATION;  Surgeon: Federico Rosario BROCKS, MD;  Location: [facility];  Service: Gastroenterology;  Laterality: N/A;   BASCILIC VEIN TRANSPOSITION Left 12/21/2018   Procedure: Left arm BASILIC  VEIN TRANSPOSITION SECOND STAGE;  Surgeon: Gretta Lonni PARAS, MD;  Location: [facility] OR;  Service: Vascular;  Laterality: Left;   BIOPSY  12/29/2021   Procedure: BIOPSY;  Surgeon: Aneita Gwendlyn DASEN, MD;  Location: [facility] ENDOSCOPY;  Service: Gastroenterology;;   CHOLECYSTECTOMY N/A 06/30/2017   Procedure: LAPAROSCOPIC CHOLECYSTECTOMY WITH INTRAOPERATIVE CHOLANGIOGRAM;  Surgeon: Mikell Katz, MD;  Location: [facility] ORS;  Service: General;  Laterality: N/A;   ESOPHAGOGASTRODUODENOSCOPY (EGD) WITH PROPOFOL  N/A 07/04/2017   Procedure: ESOPHAGOGASTRODUODENOSCOPY (EGD) WITH PROPOFOL ;  Surgeon: Rosalie Kitchens, MD;  Location: [facility];  Service: Endoscopy;  Laterality: N/A;   ESOPHAGOGASTRODUODENOSCOPY (EGD) WITH PROPOFOL  N/A 08/10/2020   Procedure: ESOPHAGOGASTRODUODENOSCOPY (EGD) WITH PROPOFOL ;  Surgeon: Legrand Victory LITTIE DOUGLAS, MD;  Location: [facility] ENDOSCOPY;  Service: Gastroenterology;  Laterality: N/A;   ESOPHAGOGASTRODUODENOSCOPY (EGD) WITH PROPOFOL  N/A 01/19/2021   Procedure: ESOPHAGOGASTRODUODENOSCOPY (EGD) WITH PROPOFOL ;  Surgeon: Avram Lupita BRAVO, MD;  Location: [facility];  Service: Endoscopy;  Laterality: N/A;  WITH FLUOROSCOPY AND DILATION   ESOPHAGOGASTRODUODENOSCOPY (EGD) WITH PROPOFOL  N/A 02/03/2021   Procedure: ESOPHAGOGASTRODUODENOSCOPY (EGD) WITH PROPOFOL ;  Surgeon: Avram Lupita BRAVO, MD;  Location: [facility];  Service: Endoscopy;  Laterality: N/A;   ESOPHAGOGASTRODUODENOSCOPY (EGD) WITH PROPOFOL  N/A 02/10/2021   Procedure: ESOPHAGOGASTRODUODENOSCOPY (EGD) WITH PROPOFOL ;  Surgeon: Abran Norleen SAILOR, MD;  Location: [facility];  Service: Endoscopy;  Laterality: N/A;  Balloon Dilation   ESOPHAGOGASTRODUODENOSCOPY (EGD) WITH PROPOFOL  N/A 02/19/2021   Procedure: ESOPHAGOGASTRODUODENOSCOPY (EGD) WITH PROPOFOL ;  Surgeon: Federico Rosario BROCKS, MD;  Location: [facility];  Service: Gastroenterology;  Laterality: N/A;   ESOPHAGOGASTRODUODENOSCOPY (EGD) WITH PROPOFOL  N/A 03/12/2021   Procedure:  ESOPHAGOGASTRODUODENOSCOPY (EGD) WITH PROPOFOL ;  Surgeon: San Sandor GAILS, DO;  Location: [facility];  Service: Gastroenterology;  Laterality: N/A;   ESOPHAGOGASTRODUODENOSCOPY (EGD) WITH PROPOFOL  N/A 12/29/2021   Procedure: ESOPHAGOGASTRODUODENOSCOPY (EGD) WITH PROPOFOL ;  Surgeon: Aneita Gwendlyn DASEN, MD;  Location: [facility] ENDOSCOPY;  Service: Gastroenterology;  Laterality: N/A;   FLEXIBLE SIGMOIDOSCOPY N/A 12/29/2021   Procedure: FLEXIBLE SIGMOIDOSCOPY;  Surgeon: Aneita Gwendlyn DASEN, MD;  Location: [facility] ENDOSCOPY;  Service: Gastroenterology;  Laterality: N/A;   FOREIGN BODY REMOVAL N/A 02/03/2021   Procedure: FOREIGN BODY REMOVAL;  Surgeon: Avram Lupita BRAVO, MD;  Location: [facility];  Service: Endoscopy;  Laterality: N/A;   INSERTION OF DIALYSIS CATHETER N/A 03/20/2018   Procedure: INSERTION OF TUNNELED DIALYSIS CATHETER - RIGHT INTERANL JUGULAR PLACEMENT;  Surgeon: Eliza Lonni RAMAN, MD;  Location: [facility] OR;  Service: Vascular;  Laterality: N/A;   IR FLUORO GUIDE CV LINE RIGHT  04/18/2020   IR GUIDED DRAIN W CATHETER PLACEMENT  10/28/2018   KIDNEY TRANSPLANT  2007   KIDNEY TRANSPLANT Right    KIDNEY TRANSPLANT     PARATHYROIDECTOMY  ?2012   3/4 removed (10/24/2017)   RENAL BIOPSY Bilateral 2003   REVISON OF ARTERIOVENOUS FISTULA Left 09/24/2020   Procedure: LEFT UPPER ARM ARTERIOVENOUS GRAFT CREATION;  Surgeon: Gretta Lonni PARAS, MD;  Location: [facility] OR;  Service: Vascular;  Laterality: Left;   UPPER EXTREMITY VENOGRAPHY Bilateral  10/19/2018   Procedure: UPPER EXTREMITY VENOGRAPHY;  Surgeon: Gretta Lonni PARAS, MD;  Location: [facility] INVASIVE CV LAB;  Service: Cardiovascular;  Laterality: Bilateral;  Bilateral    UPPER EXTREMITY VENOGRAPHY Left 09/04/2020   Procedure: UPPER EXTREMITY VENOGRAPHY - Left Upper;  Surgeon: Gretta Lonni PARAS, MD;  Location: [facility];  Service: Cardiovascular;  Laterality: Left;    Prior to Admission medications  Medication Sig Start Date End Date  Taking? Authorizing Provider  calcitRIOL  (ROCALTROL ) 0.5 MCG capsule Take 0.5 mcg by mouth daily. 05/16/23   [provider]  Calcium  Carbonate Antacid (CALCIUM  CARBONATE, DOSED IN MG ELEMENTAL CALCIUM ,) 1250 MG/5ML SUSP Take 500 mg of elemental calcium  by mouth 3 (three) times daily. 05/15/23   [provider]  carvedilol  (COREG ) 12.5 MG tablet Take 12.5 mg by mouth 2 (two) times daily. 05/16/23   [provider]  conjugated estrogens (PREMARIN) vaginal cream Place 1 applicator vaginally daily as needed (for dryness). 05/23/23 05/22/24  [provider]  ELIQUIS  5 MG TABS tablet Take 5 mg by mouth 2 (two) times daily. Patient not taking: Reported on 08/09/2023 05/16/23   [provider]  esomeprazole (NEXIUM) 40 MG capsule Take 40 mg by mouth 2 (two) times daily before a meal. 09/27/23   [provider]  famotidine  (PEPCID ) 20 MG tablet Take 1 tablet (20 mg total) by mouth 2 (two) times daily. 06/21/23   Mesner, Selinda, MD  gabapentin  (NEURONTIN ) 300 MG capsule Take 300 mg by mouth at bedtime.    [provider]  hydrOXYzine  (ATARAX ) 25 MG tablet Take 25 mg by mouth daily as needed for itching. 05/16/23   [provider]  Insulin  Aspart FlexPen (NOVOLOG ) 100 UNIT/ML inject 4-10 units  under the skin with meals as directed.  Max 30 units per day 05/19/23   [provider]  insulin  degludec (TRESIBA ) 100 UNIT/ML FlexTouch Pen Inject 30 Units into the skin daily. 05/19/23 05/13/24  [provider]  methocarbamol  (ROBAXIN ) 500 MG tablet Take 1 tablet (500 mg total) by mouth every 8 (eight) hours as needed for muscle spasms. 07/14/23   Harden Jerona GAILS, MD  mycophenolate  (CELLCEPT ) 250 MG capsule Take 1 capsule (250 mg total) by mouth 2 (two) times daily. Please resume your home dose of 750 twice daily dose once you have finished your deficit treatment on 06/18/2023. 06/14/23   Singh, Prashant K, MD  oxyCODONE  (ROXICODONE ) 5 MG immediate release  tablet Take 1 tablet (5 mg total) by mouth every 4 (four) hours as needed for severe pain (pain score 7-10). 01/08/24   Roselyn Carlin NOVAK, MD  pravastatin  (PRAVACHOL ) 40 MG tablet Take 40 mg by mouth daily. 05/16/23   [provider]  predniSONE  (DELTASONE ) 5 MG tablet Take 5 mg by mouth daily with breakfast.    [provider]  prochlorperazine  (COMPAZINE ) 10 MG tablet Take 1 tablet (10 mg total) by mouth 2 (two) times daily as needed for nausea or vomiting. 01/08/24   Roselyn Carlin NOVAK, MD  sucralfate  (CARAFATE ) 1 g tablet Take 1 tablet (1 g total) by mouth 4 (four) times daily -  with meals and at bedtime for 15 days. 06/21/23 07/06/23  Mesner, Selinda, MD  sulfamethoxazole -trimethoprim  (BACTRIM ) 400-80 MG tablet Take 1 tablet by mouth 3 (three) times a week. Monday, Wednesday and Fridays 05/16/23   [provider]  tacrolimus  (PROGRAF ) 5 MG capsule Take 1 capsule (5 mg total) by mouth 2 (two) times daily. 06/10/23   Ogbata,  Leatrice FERNS, MD    Allergies: Diphenhydramine, Peanut-containing drug products, Contrast media [iodinated contrast media], Shellfish allergy, Chlorhexidine , Ferrous sulfate, and Iron  dextran    Review of Systems  Updated Vital Signs BP 113/80   Pulse 98   Temp 98.1 F (36.7 C) (Oral)   Resp 20   Ht 5' 6 (1.676 m)   Wt 69.4 kg   SpO2 100%   BMI 24.69 kg/m   Physical Exam  (all labs ordered are listed, but only abnormal results are displayed) Labs Reviewed  CBC WITH DIFFERENTIAL/PLATELET - Abnormal; Notable for the following components:      Result Value   RBC 2.89 (*)    Hemoglobin 8.5 (*)    HCT 26.8 (*)    All other components within normal limits  BASIC METABOLIC PANEL WITH GFR  URINALYSIS, ROUTINE W REFLEX MICROSCOPIC    EKG: None  Radiology: No results found.  {Document cardiac monitor, telemetry assessment procedure when appropriate:32947} Procedures   Medications Ordered in the ED - No data to display    {Click here  for ABCD2, HEART and other calculators REFRESH Note before signing:1}                              Medical Decision Making Amount and/or Complexity of Data Reviewed Labs: ordered. Radiology: ordered.   ***    Urinalysis with 6-10 white blood cells, small leukocytes.  Her pregnancy test is negative.  Her hemoglobin is 8.5, compared to 9.2 in November, 8.9 in August.  She is not having acute bleeding symptoms.  Her creatinine is 7.32 today from 2.83 in November and 2.74 December.  Her bicarb is 12, with an anion gap of 17.  X-ray of her foot shows possible fragmentation and cortical irregularity along the plantar aspect of the second distal phalanx worrisome for osteomyelitis.   {Document critical care time when appropriate  Document review of labs and clinical decision tools ie CHADS2VASC2, etc  Document your independent review of radiology images and any outside records  Document your discussion with family members, caretakers and with consultants  Document social determinants of health affecting pt's care  Document your decision making why or why not admission, treatments were needed:32947:::1}   Final diagnoses:  None    ED Discharge Orders     None        "

## 2024-03-05 ENCOUNTER — Other Ambulatory Visit: Payer: Self-pay

## 2024-03-05 ENCOUNTER — Emergency Department (HOSPITAL_COMMUNITY)
Admission: EM | Admit: 2024-03-05 | Discharge: 2024-03-06 | Discharge status: Against Medical Advice | Attending: Physician Assistant | Admitting: Physician Assistant

## 2024-03-05 ENCOUNTER — Encounter (HOSPITAL_COMMUNITY): Payer: Self-pay

## 2024-03-05 DIAGNOSIS — Z5329 Procedure and treatment not carried out because of patient's decision for other reasons: Secondary | ICD-10-CM | POA: Insufficient documentation

## 2024-03-05 DIAGNOSIS — R799 Abnormal finding of blood chemistry, unspecified: Secondary | ICD-10-CM | POA: Diagnosis present

## 2024-03-05 LAB — CBC WITH DIFFERENTIAL/PLATELET
Abs Immature Granulocytes: 0.04 K/uL (ref 0.00–0.07)
Basophils Absolute: 0 K/uL (ref 0.0–0.1)
Basophils Relative: 1 %
Eosinophils Absolute: 0 K/uL (ref 0.0–0.5)
Eosinophils Relative: 1 %
HCT: 28 % — ABNORMAL LOW (ref 36.0–46.0)
Hemoglobin: 8.7 g/dL — ABNORMAL LOW (ref 12.0–15.0)
Immature Granulocytes: 1 %
Lymphocytes Relative: 8 %
Lymphs Abs: 0.6 K/uL — ABNORMAL LOW (ref 0.7–4.0)
MCH: 29.1 pg (ref 26.0–34.0)
MCHC: 31.1 g/dL (ref 30.0–36.0)
MCV: 93.6 fL (ref 80.0–100.0)
Monocytes Absolute: 0.2 K/uL (ref 0.1–1.0)
Monocytes Relative: 3 %
Neutro Abs: 5.7 K/uL (ref 1.7–7.7)
Neutrophils Relative %: 86 %
Platelets: 262 K/uL (ref 150–400)
RBC: 2.99 MIL/uL — ABNORMAL LOW (ref 3.87–5.11)
RDW: 13 % (ref 11.5–15.5)
WBC: 6.5 K/uL (ref 4.0–10.5)
nRBC: 0 % (ref 0.0–0.2)

## 2024-03-05 LAB — COMPREHENSIVE METABOLIC PANEL WITH GFR
ALT: <5 U/L (ref 0–44)
AST: 13 U/L — ABNORMAL LOW (ref 15–41)
Albumin: 4.3 g/dL (ref 3.5–5.0)
Alkaline Phosphatase: 84 U/L (ref 38–126)
Anion gap: 17 — ABNORMAL HIGH (ref 5–15)
BUN: 45 mg/dL — ABNORMAL HIGH (ref 6–20)
CO2: 13 mmol/L — ABNORMAL LOW (ref 22–32)
Calcium: 8.1 mg/dL — ABNORMAL LOW (ref 8.9–10.3)
Chloride: 105 mmol/L (ref 98–111)
Creatinine, Ser: 6.35 mg/dL — ABNORMAL HIGH (ref 0.44–1.00)
GFR, Estimated: 8 mL/min — ABNORMAL LOW
Glucose, Bld: 243 mg/dL — ABNORMAL HIGH (ref 70–99)
Potassium: 5.1 mmol/L (ref 3.5–5.1)
Sodium: 135 mmol/L (ref 135–145)
Total Bilirubin: 0.3 mg/dL (ref 0.0–1.2)
Total Protein: 7.8 g/dL (ref 6.5–8.1)

## 2024-03-05 NOTE — ED Provider Triage Note (Signed)
 Emergency Medicine Provider Triage Evaluation Note  CAMRY ROBELLO , a 38 y.o. female  was evaluated in triage.  Pt complains of abnormal lab. According to records evaluated at [facility] yesterday creatine from 2.83 to 7.32 in November. She was advised to be admitted but left AMA.   Review of Systems  Positive:  Negative:   Physical Exam  BP (!) 134/93 (BP Location: Right Arm)   Pulse 96   Temp 98.1 F (36.7 C)   Resp 19   Wt 69.4 kg   SpO2 100%   BMI 24.69 kg/m  Gen:   Awake, no distress   Resp:  Normal effort  MSK:   Moves extremities without difficulty  Other:    Medical Decision Making  Medically screening exam initiated at 8:09 PM.  Appropriate orders placed.  MELANEE CORDIAL was informed that the remainder of the evaluation will be completed by another provider, this initial triage assessment does not replace that evaluation, and the importance of remaining in the ED until their evaluation is complete.     Mariadelosang Wynns, PA-C 03/05/24 2011

## 2024-03-05 NOTE — ED Triage Notes (Signed)
 Pt wants her kidney function rechecked. States that she was seen at [facility] and her creatinine was elevated x 2 days ago. Hx of kidney transplant x 2, hx of rejection.

## 2024-03-06 NOTE — ED Notes (Signed)
Pt is leaving. 

## 2024-03-28 ENCOUNTER — Encounter (HOSPITAL_BASED_OUTPATIENT_CLINIC_OR_DEPARTMENT_OTHER): Admitting: Internal Medicine
